# Patient Record
Sex: Female | Born: 1965 | Race: White | Hispanic: No | Marital: Married | Smoking: Former smoker
Health system: Southern US, Community
[De-identification: ages and names within clinical notes are randomized; demographics above are authoritative.]

## PROBLEM LIST (undated history)

## (undated) ENCOUNTER — Emergency Department: Discharge status: Absent without leave | Payer: Self-pay

## (undated) ENCOUNTER — Emergency Department (HOSPITAL_BASED_OUTPATIENT_CLINIC_OR_DEPARTMENT_OTHER): Admission: EM | Payer: Medicare HMO

## (undated) ENCOUNTER — Emergency Department (HOSPITAL_BASED_OUTPATIENT_CLINIC_OR_DEPARTMENT_OTHER): Payer: Medicare HMO | Source: Home / Self Care

## (undated) DIAGNOSIS — F40240 Claustrophobia: Secondary | ICD-10-CM

## (undated) DIAGNOSIS — R569 Unspecified convulsions: Secondary | ICD-10-CM

## (undated) DIAGNOSIS — Z22322 Carrier or suspected carrier of Methicillin resistant Staphylococcus aureus: Secondary | ICD-10-CM

## (undated) DIAGNOSIS — F4310 Post-traumatic stress disorder, unspecified: Secondary | ICD-10-CM

## (undated) DIAGNOSIS — R252 Cramp and spasm: Secondary | ICD-10-CM

## (undated) DIAGNOSIS — M25519 Pain in unspecified shoulder: Secondary | ICD-10-CM

## (undated) DIAGNOSIS — J383 Other diseases of vocal cords: Secondary | ICD-10-CM

## (undated) DIAGNOSIS — C4499 Other specified malignant neoplasm of skin, unspecified: Secondary | ICD-10-CM

## (undated) DIAGNOSIS — I10 Essential (primary) hypertension: Secondary | ICD-10-CM

## (undated) DIAGNOSIS — G8929 Other chronic pain: Secondary | ICD-10-CM

## (undated) DIAGNOSIS — E559 Vitamin D deficiency, unspecified: Secondary | ICD-10-CM

## (undated) DIAGNOSIS — E119 Type 2 diabetes mellitus without complications: Secondary | ICD-10-CM

## (undated) DIAGNOSIS — C55 Malignant neoplasm of uterus, part unspecified: Secondary | ICD-10-CM

## (undated) DIAGNOSIS — T4145XA Adverse effect of unspecified anesthetic, initial encounter: Secondary | ICD-10-CM

## (undated) DIAGNOSIS — G35D Multiple sclerosis, unspecified: Secondary | ICD-10-CM

## (undated) DIAGNOSIS — T7840XA Allergy, unspecified, initial encounter: Secondary | ICD-10-CM

## (undated) DIAGNOSIS — C519 Malignant neoplasm of vulva, unspecified: Secondary | ICD-10-CM

## (undated) DIAGNOSIS — K219 Gastro-esophageal reflux disease without esophagitis: Secondary | ICD-10-CM

## (undated) DIAGNOSIS — E785 Hyperlipidemia, unspecified: Secondary | ICD-10-CM

## (undated) DIAGNOSIS — R05 Cough: Secondary | ICD-10-CM

## (undated) DIAGNOSIS — G35 Multiple sclerosis: Secondary | ICD-10-CM

## (undated) DIAGNOSIS — I493 Ventricular premature depolarization: Secondary | ICD-10-CM

## (undated) DIAGNOSIS — D649 Anemia, unspecified: Secondary | ICD-10-CM

## (undated) DIAGNOSIS — R519 Headache, unspecified: Secondary | ICD-10-CM

## (undated) DIAGNOSIS — I471 Supraventricular tachycardia: Secondary | ICD-10-CM

## (undated) DIAGNOSIS — R059 Cough, unspecified: Secondary | ICD-10-CM

## (undated) DIAGNOSIS — T8859XA Other complications of anesthesia, initial encounter: Secondary | ICD-10-CM

## (undated) DIAGNOSIS — F609 Personality disorder, unspecified: Secondary | ICD-10-CM

## (undated) DIAGNOSIS — M542 Cervicalgia: Secondary | ICD-10-CM

## (undated) DIAGNOSIS — M545 Low back pain: Secondary | ICD-10-CM

## (undated) DIAGNOSIS — R51 Headache: Secondary | ICD-10-CM

## (undated) DIAGNOSIS — G4730 Sleep apnea, unspecified: Secondary | ICD-10-CM

## (undated) HISTORY — DX: Personality disorder, unspecified: F60.9

## (undated) HISTORY — DX: Cough: R05

## (undated) HISTORY — DX: Cervicalgia: M54.2

## (undated) HISTORY — PX: OTHER SURGICAL HISTORY: SHX169

## (undated) HISTORY — DX: Other chronic pain: G89.29

## (undated) HISTORY — PX: APPENDECTOMY: SHX54

## (undated) HISTORY — DX: Unspecified convulsions: R56.9

## (undated) HISTORY — DX: Anemia, unspecified: D64.9

## (undated) HISTORY — DX: Pain in unspecified shoulder: M25.519

## (undated) HISTORY — DX: Headache, unspecified: R51.9

## (undated) HISTORY — PX: CARDIAC CATHETERIZATION: SHX172

## (undated) HISTORY — DX: Allergy, unspecified, initial encounter: T78.40XA

## (undated) HISTORY — PX: LOOP RECORDER REMOVAL: EP1215

## (undated) HISTORY — DX: Cough, unspecified: R05.9

## (undated) HISTORY — DX: Essential (primary) hypertension: I10

## (undated) HISTORY — DX: Other specified malignant neoplasm of skin, unspecified: C44.99

## (undated) HISTORY — PX: BREAST LUMPECTOMY: SHX2

## (undated) HISTORY — DX: Headache: R51

## (undated) HISTORY — DX: Cramp and spasm: R25.2

## (undated) HISTORY — DX: Gastro-esophageal reflux disease without esophagitis: K21.9

## (undated) HISTORY — DX: Malignant neoplasm of vulva, unspecified: C51.9

## (undated) HISTORY — DX: Hyperlipidemia, unspecified: E78.5

## (undated) HISTORY — DX: Supraventricular tachycardia: I47.1

## (undated) HISTORY — PX: ESOPHAGEAL DILATION: SHX303

## (undated) HISTORY — DX: Post-traumatic stress disorder, unspecified: F43.10

## (undated) HISTORY — PX: CHOLECYSTECTOMY: SHX55

## (undated) HISTORY — PX: TUBAL LIGATION: SHX77

## (undated) HISTORY — DX: Sleep apnea, unspecified: G47.30

## (undated) HISTORY — DX: Low back pain: M54.5

---

## 2001-10-13 ENCOUNTER — Emergency Department (HOSPITAL_COMMUNITY): Admission: EM | Admit: 2001-10-13 | Discharge: 2001-10-13 | Payer: Self-pay

## 2002-01-23 ENCOUNTER — Encounter: Payer: Self-pay | Admitting: Critical Care Medicine

## 2002-01-23 ENCOUNTER — Encounter: Payer: Self-pay | Admitting: Pulmonary Disease

## 2003-01-11 ENCOUNTER — Emergency Department (HOSPITAL_COMMUNITY): Admission: EM | Admit: 2003-01-11 | Discharge: 2003-01-11 | Payer: Self-pay | Admitting: Emergency Medicine

## 2003-01-11 ENCOUNTER — Encounter: Payer: Self-pay | Admitting: Emergency Medicine

## 2003-04-19 ENCOUNTER — Emergency Department (HOSPITAL_COMMUNITY): Admission: EM | Admit: 2003-04-19 | Discharge: 2003-04-20 | Payer: Self-pay | Admitting: Emergency Medicine

## 2003-06-10 ENCOUNTER — Encounter: Payer: Self-pay | Admitting: Critical Care Medicine

## 2004-01-13 ENCOUNTER — Encounter: Payer: Self-pay | Admitting: Critical Care Medicine

## 2004-02-12 DIAGNOSIS — M545 Low back pain, unspecified: Secondary | ICD-10-CM

## 2004-02-12 HISTORY — DX: Low back pain: M54.5

## 2004-02-12 HISTORY — DX: Low back pain, unspecified: M54.50

## 2005-11-27 ENCOUNTER — Encounter: Payer: Self-pay | Admitting: Critical Care Medicine

## 2005-12-12 DIAGNOSIS — M542 Cervicalgia: Secondary | ICD-10-CM

## 2005-12-12 HISTORY — DX: Cervicalgia: M54.2

## 2005-12-28 ENCOUNTER — Encounter: Payer: Self-pay | Admitting: Critical Care Medicine

## 2006-03-01 ENCOUNTER — Encounter: Payer: Self-pay | Admitting: Critical Care Medicine

## 2007-01-16 ENCOUNTER — Encounter: Payer: Self-pay | Admitting: Critical Care Medicine

## 2007-04-04 ENCOUNTER — Encounter: Payer: Self-pay | Admitting: Critical Care Medicine

## 2007-04-05 ENCOUNTER — Encounter: Payer: Self-pay | Admitting: Critical Care Medicine

## 2007-04-18 ENCOUNTER — Encounter: Payer: Self-pay | Admitting: Critical Care Medicine

## 2007-04-22 ENCOUNTER — Encounter: Payer: Self-pay | Admitting: Critical Care Medicine

## 2007-05-23 ENCOUNTER — Encounter: Payer: Self-pay | Admitting: Critical Care Medicine

## 2007-05-23 ENCOUNTER — Encounter: Payer: Self-pay | Admitting: Family Medicine

## 2007-06-21 ENCOUNTER — Emergency Department (HOSPITAL_COMMUNITY): Admission: EM | Admit: 2007-06-21 | Discharge: 2007-06-21 | Payer: Self-pay | Admitting: Emergency Medicine

## 2007-07-02 ENCOUNTER — Encounter: Payer: Self-pay | Admitting: Critical Care Medicine

## 2007-08-06 ENCOUNTER — Encounter: Payer: Self-pay | Admitting: Family Medicine

## 2007-08-15 DIAGNOSIS — G4730 Sleep apnea, unspecified: Secondary | ICD-10-CM

## 2007-08-15 HISTORY — DX: Sleep apnea, unspecified: G47.30

## 2007-08-26 ENCOUNTER — Encounter (INDEPENDENT_AMBULATORY_CARE_PROVIDER_SITE_OTHER): Payer: Self-pay | Admitting: *Deleted

## 2007-08-30 ENCOUNTER — Emergency Department (HOSPITAL_COMMUNITY): Admission: EM | Admit: 2007-08-30 | Discharge: 2007-08-31 | Payer: Self-pay | Admitting: Emergency Medicine

## 2007-09-01 ENCOUNTER — Encounter (INDEPENDENT_AMBULATORY_CARE_PROVIDER_SITE_OTHER): Payer: Self-pay | Admitting: *Deleted

## 2007-09-16 ENCOUNTER — Encounter (INDEPENDENT_AMBULATORY_CARE_PROVIDER_SITE_OTHER): Payer: Self-pay | Admitting: *Deleted

## 2007-09-17 ENCOUNTER — Encounter: Payer: Self-pay | Admitting: Critical Care Medicine

## 2007-10-07 ENCOUNTER — Encounter: Payer: Self-pay | Admitting: Critical Care Medicine

## 2007-10-10 ENCOUNTER — Encounter: Payer: Self-pay | Admitting: Critical Care Medicine

## 2007-10-24 ENCOUNTER — Encounter: Payer: Self-pay | Admitting: Family Medicine

## 2007-11-01 ENCOUNTER — Encounter: Payer: Self-pay | Admitting: Critical Care Medicine

## 2007-11-14 ENCOUNTER — Encounter: Payer: Self-pay | Admitting: Critical Care Medicine

## 2007-11-23 ENCOUNTER — Encounter: Payer: Self-pay | Admitting: Critical Care Medicine

## 2007-12-04 ENCOUNTER — Emergency Department (HOSPITAL_COMMUNITY): Admission: EM | Admit: 2007-12-04 | Discharge: 2007-12-04 | Payer: Self-pay | Admitting: Emergency Medicine

## 2007-12-05 ENCOUNTER — Encounter: Payer: Self-pay | Admitting: Critical Care Medicine

## 2007-12-09 ENCOUNTER — Ambulatory Visit: Payer: Self-pay | Admitting: Family Medicine

## 2007-12-09 DIAGNOSIS — I10 Essential (primary) hypertension: Secondary | ICD-10-CM | POA: Insufficient documentation

## 2007-12-09 DIAGNOSIS — K760 Fatty (change of) liver, not elsewhere classified: Secondary | ICD-10-CM | POA: Insufficient documentation

## 2007-12-09 DIAGNOSIS — K7689 Other specified diseases of liver: Secondary | ICD-10-CM | POA: Insufficient documentation

## 2007-12-12 LAB — CONVERTED CEMR LAB: Pap Smear: NORMAL

## 2007-12-21 ENCOUNTER — Encounter: Payer: Self-pay | Admitting: Family Medicine

## 2007-12-27 ENCOUNTER — Encounter: Payer: Self-pay | Admitting: Physician Assistant

## 2007-12-27 ENCOUNTER — Ambulatory Visit: Payer: Self-pay | Admitting: Physician Assistant

## 2007-12-27 ENCOUNTER — Other Ambulatory Visit: Admission: RE | Admit: 2007-12-27 | Discharge: 2007-12-27 | Payer: Self-pay | Admitting: Gynecology

## 2008-01-10 ENCOUNTER — Telehealth: Payer: Self-pay | Admitting: Family Medicine

## 2008-01-13 DIAGNOSIS — K219 Gastro-esophageal reflux disease without esophagitis: Secondary | ICD-10-CM

## 2008-01-13 HISTORY — DX: Gastro-esophageal reflux disease without esophagitis: K21.9

## 2008-01-16 ENCOUNTER — Emergency Department (HOSPITAL_BASED_OUTPATIENT_CLINIC_OR_DEPARTMENT_OTHER): Admission: EM | Admit: 2008-01-16 | Discharge: 2008-01-16 | Payer: Self-pay | Admitting: Emergency Medicine

## 2008-01-16 ENCOUNTER — Ambulatory Visit: Payer: Self-pay | Admitting: Family Medicine

## 2008-01-18 ENCOUNTER — Encounter: Payer: Self-pay | Admitting: Critical Care Medicine

## 2008-01-19 ENCOUNTER — Emergency Department (HOSPITAL_BASED_OUTPATIENT_CLINIC_OR_DEPARTMENT_OTHER): Admission: EM | Admit: 2008-01-19 | Discharge: 2008-01-19 | Payer: Self-pay | Admitting: Emergency Medicine

## 2008-01-20 ENCOUNTER — Encounter: Payer: Self-pay | Admitting: Family Medicine

## 2008-01-21 ENCOUNTER — Encounter: Payer: Self-pay | Admitting: Family Medicine

## 2008-01-21 DIAGNOSIS — R519 Headache, unspecified: Secondary | ICD-10-CM | POA: Insufficient documentation

## 2008-01-21 DIAGNOSIS — R51 Headache: Secondary | ICD-10-CM

## 2008-01-21 DIAGNOSIS — H532 Diplopia: Secondary | ICD-10-CM | POA: Insufficient documentation

## 2008-01-27 ENCOUNTER — Telehealth (INDEPENDENT_AMBULATORY_CARE_PROVIDER_SITE_OTHER): Payer: Self-pay | Admitting: *Deleted

## 2008-01-28 ENCOUNTER — Encounter: Payer: Self-pay | Admitting: Family Medicine

## 2008-01-29 ENCOUNTER — Encounter: Payer: Self-pay | Admitting: Family Medicine

## 2008-01-29 ENCOUNTER — Ambulatory Visit: Payer: Self-pay | Admitting: Internal Medicine

## 2008-01-30 ENCOUNTER — Encounter: Payer: Self-pay | Admitting: Family Medicine

## 2008-02-03 ENCOUNTER — Encounter: Payer: Self-pay | Admitting: Critical Care Medicine

## 2008-02-03 ENCOUNTER — Emergency Department (HOSPITAL_BASED_OUTPATIENT_CLINIC_OR_DEPARTMENT_OTHER): Admission: EM | Admit: 2008-02-03 | Discharge: 2008-02-03 | Payer: Self-pay | Admitting: Emergency Medicine

## 2008-02-03 ENCOUNTER — Encounter: Payer: Self-pay | Admitting: Family Medicine

## 2008-02-04 ENCOUNTER — Encounter: Payer: Self-pay | Admitting: Critical Care Medicine

## 2008-02-04 ENCOUNTER — Encounter: Payer: Self-pay | Admitting: Pulmonary Disease

## 2008-02-04 ENCOUNTER — Encounter: Payer: Self-pay | Admitting: Family Medicine

## 2008-02-05 ENCOUNTER — Encounter: Payer: Self-pay | Admitting: Pulmonary Disease

## 2008-02-10 ENCOUNTER — Encounter: Payer: Self-pay | Admitting: Family Medicine

## 2008-02-12 ENCOUNTER — Ambulatory Visit: Payer: Self-pay | Admitting: Family Medicine

## 2008-02-14 ENCOUNTER — Ambulatory Visit: Payer: Self-pay | Admitting: Internal Medicine

## 2008-02-14 ENCOUNTER — Emergency Department (HOSPITAL_COMMUNITY): Admission: EM | Admit: 2008-02-14 | Discharge: 2008-02-14 | Payer: Self-pay | Admitting: Emergency Medicine

## 2008-02-17 ENCOUNTER — Emergency Department (HOSPITAL_BASED_OUTPATIENT_CLINIC_OR_DEPARTMENT_OTHER): Admission: EM | Admit: 2008-02-17 | Discharge: 2008-02-18 | Payer: Self-pay | Admitting: Emergency Medicine

## 2008-02-17 ENCOUNTER — Ambulatory Visit: Payer: Self-pay | Admitting: Internal Medicine

## 2008-02-19 ENCOUNTER — Encounter: Payer: Self-pay | Admitting: Critical Care Medicine

## 2008-02-21 ENCOUNTER — Ambulatory Visit: Payer: Self-pay | Admitting: Family Medicine

## 2008-02-21 DIAGNOSIS — R1310 Dysphagia, unspecified: Secondary | ICD-10-CM | POA: Insufficient documentation

## 2008-02-26 ENCOUNTER — Encounter: Payer: Self-pay | Admitting: Family Medicine

## 2008-02-26 ENCOUNTER — Telehealth: Payer: Self-pay | Admitting: Family Medicine

## 2008-03-01 LAB — CONVERTED CEMR LAB
ALT: 13 units/L (ref 0–35)
AST: 13 units/L (ref 0–37)
Albumin: 4.5 g/dL (ref 3.5–5.2)
Alkaline Phosphatase: 55 units/L (ref 39–117)
BUN: 13 mg/dL (ref 6–23)
CO2: 20 meq/L (ref 19–32)
Calcium: 9.1 mg/dL (ref 8.4–10.5)
Chloride: 106 meq/L (ref 96–112)
Cholesterol, target level: 200 mg/dL
Cholesterol: 224 mg/dL — ABNORMAL HIGH (ref 0–200)
Creatinine, Ser: 0.59 mg/dL (ref 0.40–1.20)
Glucose, Bld: 75 mg/dL (ref 70–99)
HDL goal, serum: 40 mg/dL
HDL: 40 mg/dL (ref >39)
LDL Cholesterol: 156 mg/dL — ABNORMAL HIGH (ref 0–99)
LDL Goal: 160 mg/dL
Potassium: 4.3 meq/L (ref 3.5–5.3)
Sodium: 141 meq/L (ref 135–145)
Total Bilirubin: 0.5 mg/dL (ref 0.3–1.2)
Total CHOL/HDL Ratio: 5.6
Total Protein: 7.6 g/dL (ref 6.0–8.3)
Triglycerides: 139 mg/dL (ref <150)
VLDL: 28 mg/dL (ref 0–40)

## 2008-03-03 ENCOUNTER — Encounter: Payer: Self-pay | Admitting: Family Medicine

## 2008-03-03 ENCOUNTER — Emergency Department (HOSPITAL_BASED_OUTPATIENT_CLINIC_OR_DEPARTMENT_OTHER): Admission: EM | Admit: 2008-03-03 | Discharge: 2008-03-03 | Payer: Self-pay | Admitting: Emergency Medicine

## 2008-03-14 ENCOUNTER — Emergency Department (HOSPITAL_BASED_OUTPATIENT_CLINIC_OR_DEPARTMENT_OTHER): Admission: EM | Admit: 2008-03-14 | Discharge: 2008-03-14 | Payer: Self-pay | Admitting: Emergency Medicine

## 2008-03-14 DIAGNOSIS — I471 Supraventricular tachycardia: Secondary | ICD-10-CM

## 2008-03-14 DIAGNOSIS — I4719 Other supraventricular tachycardia: Secondary | ICD-10-CM

## 2008-03-14 HISTORY — DX: Other supraventricular tachycardia: I47.19

## 2008-03-14 HISTORY — DX: Supraventricular tachycardia: I47.1

## 2008-03-18 ENCOUNTER — Encounter: Payer: Self-pay | Admitting: Critical Care Medicine

## 2008-03-24 ENCOUNTER — Encounter: Payer: Self-pay | Admitting: Family Medicine

## 2008-03-26 ENCOUNTER — Encounter: Payer: Self-pay | Admitting: Critical Care Medicine

## 2008-03-28 ENCOUNTER — Emergency Department (HOSPITAL_COMMUNITY): Admission: EM | Admit: 2008-03-28 | Discharge: 2008-03-28 | Payer: Self-pay | Admitting: Emergency Medicine

## 2008-03-31 ENCOUNTER — Emergency Department (HOSPITAL_BASED_OUTPATIENT_CLINIC_OR_DEPARTMENT_OTHER): Admission: EM | Admit: 2008-03-31 | Discharge: 2008-03-31 | Payer: Self-pay | Admitting: Emergency Medicine

## 2008-03-31 ENCOUNTER — Encounter: Payer: Self-pay | Admitting: Family Medicine

## 2008-04-01 ENCOUNTER — Ambulatory Visit: Payer: Self-pay | Admitting: Obstetrics & Gynecology

## 2008-04-11 ENCOUNTER — Emergency Department (HOSPITAL_BASED_OUTPATIENT_CLINIC_OR_DEPARTMENT_OTHER): Admission: EM | Admit: 2008-04-11 | Discharge: 2008-04-11 | Payer: Self-pay | Admitting: Emergency Medicine

## 2008-04-14 ENCOUNTER — Encounter: Payer: Self-pay | Admitting: Critical Care Medicine

## 2008-04-15 ENCOUNTER — Encounter: Payer: Self-pay | Admitting: Critical Care Medicine

## 2008-04-17 ENCOUNTER — Emergency Department (HOSPITAL_BASED_OUTPATIENT_CLINIC_OR_DEPARTMENT_OTHER): Admission: EM | Admit: 2008-04-17 | Discharge: 2008-04-17 | Payer: Self-pay | Admitting: Emergency Medicine

## 2008-04-19 ENCOUNTER — Inpatient Hospital Stay (HOSPITAL_COMMUNITY): Admission: EM | Admit: 2008-04-19 | Discharge: 2008-04-20 | Payer: Self-pay | Admitting: Emergency Medicine

## 2008-04-19 ENCOUNTER — Ambulatory Visit: Payer: Self-pay | Admitting: Cardiology

## 2008-04-22 ENCOUNTER — Ambulatory Visit: Payer: Self-pay | Admitting: Family Medicine

## 2008-04-23 ENCOUNTER — Encounter (INDEPENDENT_AMBULATORY_CARE_PROVIDER_SITE_OTHER): Payer: Self-pay | Admitting: *Deleted

## 2008-04-25 ENCOUNTER — Ambulatory Visit: Payer: Self-pay | Admitting: *Deleted

## 2008-04-26 ENCOUNTER — Inpatient Hospital Stay (HOSPITAL_COMMUNITY): Admission: EM | Admit: 2008-04-26 | Discharge: 2008-04-26 | Payer: Self-pay | Admitting: Emergency Medicine

## 2008-04-29 ENCOUNTER — Ambulatory Visit: Payer: Self-pay | Admitting: Critical Care Medicine

## 2008-05-01 ENCOUNTER — Telehealth (INDEPENDENT_AMBULATORY_CARE_PROVIDER_SITE_OTHER): Payer: Self-pay | Admitting: *Deleted

## 2008-05-03 DIAGNOSIS — K219 Gastro-esophageal reflux disease without esophagitis: Secondary | ICD-10-CM | POA: Insufficient documentation

## 2008-05-04 ENCOUNTER — Telehealth (INDEPENDENT_AMBULATORY_CARE_PROVIDER_SITE_OTHER): Payer: Self-pay | Admitting: *Deleted

## 2008-05-04 ENCOUNTER — Ambulatory Visit: Payer: Self-pay | Admitting: Pulmonary Disease

## 2008-05-07 DIAGNOSIS — Q078 Other specified congenital malformations of nervous system: Secondary | ICD-10-CM | POA: Insufficient documentation

## 2008-05-07 DIAGNOSIS — J3089 Other allergic rhinitis: Secondary | ICD-10-CM | POA: Insufficient documentation

## 2008-05-07 DIAGNOSIS — I472 Ventricular tachycardia, unspecified: Secondary | ICD-10-CM | POA: Insufficient documentation

## 2008-05-07 DIAGNOSIS — J383 Other diseases of vocal cords: Secondary | ICD-10-CM | POA: Insufficient documentation

## 2008-05-13 ENCOUNTER — Telehealth (INDEPENDENT_AMBULATORY_CARE_PROVIDER_SITE_OTHER): Payer: Self-pay | Admitting: *Deleted

## 2008-05-14 ENCOUNTER — Ambulatory Visit: Payer: Self-pay | Admitting: Critical Care Medicine

## 2008-05-14 ENCOUNTER — Telehealth: Payer: Self-pay | Admitting: Pulmonary Disease

## 2008-05-15 ENCOUNTER — Telehealth (INDEPENDENT_AMBULATORY_CARE_PROVIDER_SITE_OTHER): Payer: Self-pay | Admitting: *Deleted

## 2008-05-18 ENCOUNTER — Telehealth (INDEPENDENT_AMBULATORY_CARE_PROVIDER_SITE_OTHER): Payer: Self-pay | Admitting: *Deleted

## 2008-05-19 ENCOUNTER — Ambulatory Visit: Payer: Self-pay | Admitting: Family Medicine

## 2008-05-19 ENCOUNTER — Encounter: Payer: Self-pay | Admitting: Critical Care Medicine

## 2008-05-19 ENCOUNTER — Observation Stay (HOSPITAL_COMMUNITY): Admission: EM | Admit: 2008-05-19 | Discharge: 2008-05-20 | Payer: Self-pay | Admitting: Emergency Medicine

## 2008-05-19 ENCOUNTER — Telehealth (INDEPENDENT_AMBULATORY_CARE_PROVIDER_SITE_OTHER): Payer: Self-pay | Admitting: *Deleted

## 2008-05-19 ENCOUNTER — Encounter: Payer: Self-pay | Admitting: Family Medicine

## 2008-05-21 ENCOUNTER — Telehealth: Payer: Self-pay | Admitting: Critical Care Medicine

## 2008-05-26 ENCOUNTER — Ambulatory Visit: Payer: Self-pay | Admitting: *Deleted

## 2008-05-26 ENCOUNTER — Encounter: Payer: Self-pay | Admitting: Family Medicine

## 2008-05-26 ENCOUNTER — Encounter: Payer: Self-pay | Admitting: Critical Care Medicine

## 2008-05-27 ENCOUNTER — Encounter: Admission: RE | Admit: 2008-05-27 | Discharge: 2008-08-13 | Payer: Self-pay | Admitting: Pulmonary Disease

## 2008-05-27 ENCOUNTER — Encounter: Payer: Self-pay | Admitting: Pulmonary Disease

## 2008-05-27 ENCOUNTER — Ambulatory Visit: Payer: Self-pay | Admitting: Internal Medicine

## 2008-05-28 ENCOUNTER — Emergency Department (HOSPITAL_BASED_OUTPATIENT_CLINIC_OR_DEPARTMENT_OTHER): Admission: EM | Admit: 2008-05-28 | Discharge: 2008-05-28 | Payer: Self-pay | Admitting: Emergency Medicine

## 2008-05-28 ENCOUNTER — Ambulatory Visit: Payer: Self-pay | Admitting: Critical Care Medicine

## 2008-05-29 ENCOUNTER — Encounter: Payer: Self-pay | Admitting: Critical Care Medicine

## 2008-06-01 ENCOUNTER — Ambulatory Visit (HOSPITAL_COMMUNITY): Admission: RE | Admit: 2008-06-01 | Discharge: 2008-06-01 | Payer: Self-pay | Admitting: Gastroenterology

## 2008-06-01 ENCOUNTER — Encounter: Payer: Self-pay | Admitting: Critical Care Medicine

## 2008-06-03 ENCOUNTER — Encounter (INDEPENDENT_AMBULATORY_CARE_PROVIDER_SITE_OTHER): Payer: Self-pay | Admitting: *Deleted

## 2008-06-05 ENCOUNTER — Encounter: Payer: Self-pay | Admitting: Family Medicine

## 2008-06-08 DIAGNOSIS — F411 Generalized anxiety disorder: Secondary | ICD-10-CM | POA: Insufficient documentation

## 2008-06-08 DIAGNOSIS — J3501 Chronic tonsillitis: Secondary | ICD-10-CM | POA: Insufficient documentation

## 2008-06-08 DIAGNOSIS — F4312 Post-traumatic stress disorder, chronic: Secondary | ICD-10-CM | POA: Insufficient documentation

## 2008-06-08 DIAGNOSIS — R279 Unspecified lack of coordination: Secondary | ICD-10-CM | POA: Insufficient documentation

## 2008-06-08 DIAGNOSIS — R109 Unspecified abdominal pain: Secondary | ICD-10-CM | POA: Insufficient documentation

## 2008-06-08 DIAGNOSIS — J984 Other disorders of lung: Secondary | ICD-10-CM | POA: Insufficient documentation

## 2008-06-08 DIAGNOSIS — Z8669 Personal history of other diseases of the nervous system and sense organs: Secondary | ICD-10-CM | POA: Insufficient documentation

## 2008-06-08 DIAGNOSIS — R5381 Other malaise: Secondary | ICD-10-CM | POA: Insufficient documentation

## 2008-06-08 DIAGNOSIS — K6289 Other specified diseases of anus and rectum: Secondary | ICD-10-CM | POA: Insufficient documentation

## 2008-06-08 DIAGNOSIS — M797 Fibromyalgia: Secondary | ICD-10-CM | POA: Insufficient documentation

## 2008-06-08 DIAGNOSIS — G43909 Migraine, unspecified, not intractable, without status migrainosus: Secondary | ICD-10-CM | POA: Insufficient documentation

## 2008-06-08 DIAGNOSIS — F341 Dysthymic disorder: Secondary | ICD-10-CM | POA: Insufficient documentation

## 2008-06-08 DIAGNOSIS — K224 Dyskinesia of esophagus: Secondary | ICD-10-CM | POA: Insufficient documentation

## 2008-06-08 DIAGNOSIS — D649 Anemia, unspecified: Secondary | ICD-10-CM | POA: Insufficient documentation

## 2008-06-08 DIAGNOSIS — H5005 Alternating esotropia: Secondary | ICD-10-CM | POA: Insufficient documentation

## 2008-06-08 DIAGNOSIS — F4310 Post-traumatic stress disorder, unspecified: Secondary | ICD-10-CM | POA: Insufficient documentation

## 2008-06-08 DIAGNOSIS — M5136 Other intervertebral disc degeneration, lumbar region: Secondary | ICD-10-CM | POA: Insufficient documentation

## 2008-06-08 DIAGNOSIS — R5383 Other fatigue: Secondary | ICD-10-CM

## 2008-06-08 DIAGNOSIS — F4001 Agoraphobia with panic disorder: Secondary | ICD-10-CM | POA: Insufficient documentation

## 2008-06-09 ENCOUNTER — Emergency Department (HOSPITAL_COMMUNITY): Admission: EM | Admit: 2008-06-09 | Discharge: 2008-06-09 | Payer: Self-pay | Admitting: Emergency Medicine

## 2008-06-11 DIAGNOSIS — F603 Borderline personality disorder: Secondary | ICD-10-CM | POA: Insufficient documentation

## 2008-06-12 ENCOUNTER — Emergency Department (HOSPITAL_BASED_OUTPATIENT_CLINIC_OR_DEPARTMENT_OTHER): Admission: EM | Admit: 2008-06-12 | Discharge: 2008-06-13 | Payer: Self-pay | Admitting: Emergency Medicine

## 2008-06-15 ENCOUNTER — Telehealth (INDEPENDENT_AMBULATORY_CARE_PROVIDER_SITE_OTHER): Payer: Self-pay | Admitting: *Deleted

## 2008-06-16 ENCOUNTER — Ambulatory Visit: Payer: Self-pay | Admitting: Occupational Medicine

## 2008-06-18 ENCOUNTER — Encounter: Payer: Self-pay | Admitting: Family Medicine

## 2008-06-18 ENCOUNTER — Ambulatory Visit: Payer: Self-pay | Admitting: Critical Care Medicine

## 2008-06-23 ENCOUNTER — Telehealth: Payer: Self-pay | Admitting: Critical Care Medicine

## 2008-07-01 ENCOUNTER — Emergency Department (HOSPITAL_COMMUNITY): Admission: EM | Admit: 2008-07-01 | Discharge: 2008-07-01 | Payer: Self-pay | Admitting: Emergency Medicine

## 2008-07-04 ENCOUNTER — Encounter: Payer: Self-pay | Admitting: Family Medicine

## 2008-07-06 ENCOUNTER — Telehealth (INDEPENDENT_AMBULATORY_CARE_PROVIDER_SITE_OTHER): Payer: Self-pay | Admitting: *Deleted

## 2008-07-08 ENCOUNTER — Ambulatory Visit: Payer: Self-pay | Admitting: Internal Medicine

## 2008-07-08 ENCOUNTER — Emergency Department (HOSPITAL_COMMUNITY): Admission: EM | Admit: 2008-07-08 | Discharge: 2008-07-08 | Payer: Self-pay | Admitting: Emergency Medicine

## 2008-07-13 ENCOUNTER — Ambulatory Visit: Payer: Self-pay | Admitting: Occupational Medicine

## 2008-07-14 ENCOUNTER — Encounter: Payer: Self-pay | Admitting: Family Medicine

## 2008-07-14 ENCOUNTER — Telehealth (INDEPENDENT_AMBULATORY_CARE_PROVIDER_SITE_OTHER): Payer: Self-pay | Admitting: *Deleted

## 2008-07-15 ENCOUNTER — Ambulatory Visit: Payer: Self-pay | Admitting: Family Medicine

## 2008-07-20 ENCOUNTER — Telehealth: Payer: Self-pay | Admitting: Family Medicine

## 2008-07-22 ENCOUNTER — Ambulatory Visit: Payer: Self-pay | Admitting: Family Medicine

## 2008-07-22 DIAGNOSIS — R42 Dizziness and giddiness: Secondary | ICD-10-CM | POA: Insufficient documentation

## 2008-07-23 ENCOUNTER — Ambulatory Visit: Payer: Self-pay | Admitting: Critical Care Medicine

## 2008-08-04 ENCOUNTER — Ambulatory Visit: Payer: Self-pay | Admitting: Family Medicine

## 2008-08-04 ENCOUNTER — Encounter: Admission: RE | Admit: 2008-08-04 | Discharge: 2008-08-04 | Payer: Self-pay | Admitting: Family Medicine

## 2008-08-11 ENCOUNTER — Ambulatory Visit: Payer: Self-pay | Admitting: Family Medicine

## 2008-08-11 LAB — CONVERTED CEMR LAB
Bilirubin Urine: NEGATIVE
Blood in Urine, dipstick: NEGATIVE
Glucose, Urine, Semiquant: NEGATIVE
Ketones, urine, test strip: NEGATIVE
Nitrite: NEGATIVE
Specific Gravity, Urine: >=1.03
Urobilinogen, UA: 0.2
WBC Urine, dipstick: NEGATIVE
pH: 5.5

## 2008-08-13 ENCOUNTER — Encounter: Payer: Self-pay | Admitting: Family Medicine

## 2008-08-14 ENCOUNTER — Emergency Department (HOSPITAL_BASED_OUTPATIENT_CLINIC_OR_DEPARTMENT_OTHER): Admission: EM | Admit: 2008-08-14 | Discharge: 2008-08-14 | Payer: Self-pay | Admitting: Emergency Medicine

## 2008-08-14 ENCOUNTER — Ambulatory Visit: Payer: Self-pay | Admitting: Interventional Radiology

## 2008-08-17 LAB — CONVERTED CEMR LAB
RBC / HPF: NONE SEEN (ref <3)
WBC, UA: NONE SEEN cells/hpf (ref <3)

## 2008-08-18 ENCOUNTER — Telehealth (INDEPENDENT_AMBULATORY_CARE_PROVIDER_SITE_OTHER): Payer: Self-pay | Admitting: *Deleted

## 2008-08-25 ENCOUNTER — Telehealth (INDEPENDENT_AMBULATORY_CARE_PROVIDER_SITE_OTHER): Payer: Self-pay | Admitting: *Deleted

## 2008-08-25 ENCOUNTER — Ambulatory Visit: Payer: Self-pay | Admitting: Family Medicine

## 2008-08-27 ENCOUNTER — Telehealth: Payer: Self-pay | Admitting: Family Medicine

## 2008-08-31 ENCOUNTER — Encounter: Payer: Self-pay | Admitting: Family Medicine

## 2008-09-01 ENCOUNTER — Telehealth: Payer: Self-pay | Admitting: Family Medicine

## 2008-09-03 ENCOUNTER — Telehealth: Payer: Self-pay | Admitting: Family Medicine

## 2008-09-04 ENCOUNTER — Emergency Department (HOSPITAL_COMMUNITY): Admission: EM | Admit: 2008-09-04 | Discharge: 2008-09-05 | Payer: Self-pay | Admitting: Emergency Medicine

## 2008-09-10 ENCOUNTER — Telehealth: Payer: Self-pay | Admitting: Family Medicine

## 2008-09-10 ENCOUNTER — Ambulatory Visit: Payer: Self-pay | Admitting: Critical Care Medicine

## 2008-09-10 DIAGNOSIS — J328 Other chronic sinusitis: Secondary | ICD-10-CM | POA: Insufficient documentation

## 2008-09-13 IMAGING — CR DG CHEST 2V
2 series · 2 of 2 positions shown · non-contrast
Comparison: None.

CLINICAL DATA: Shortness of breath.

CHEST - 2 VIEW  06/21/2007:

[w chest pa]
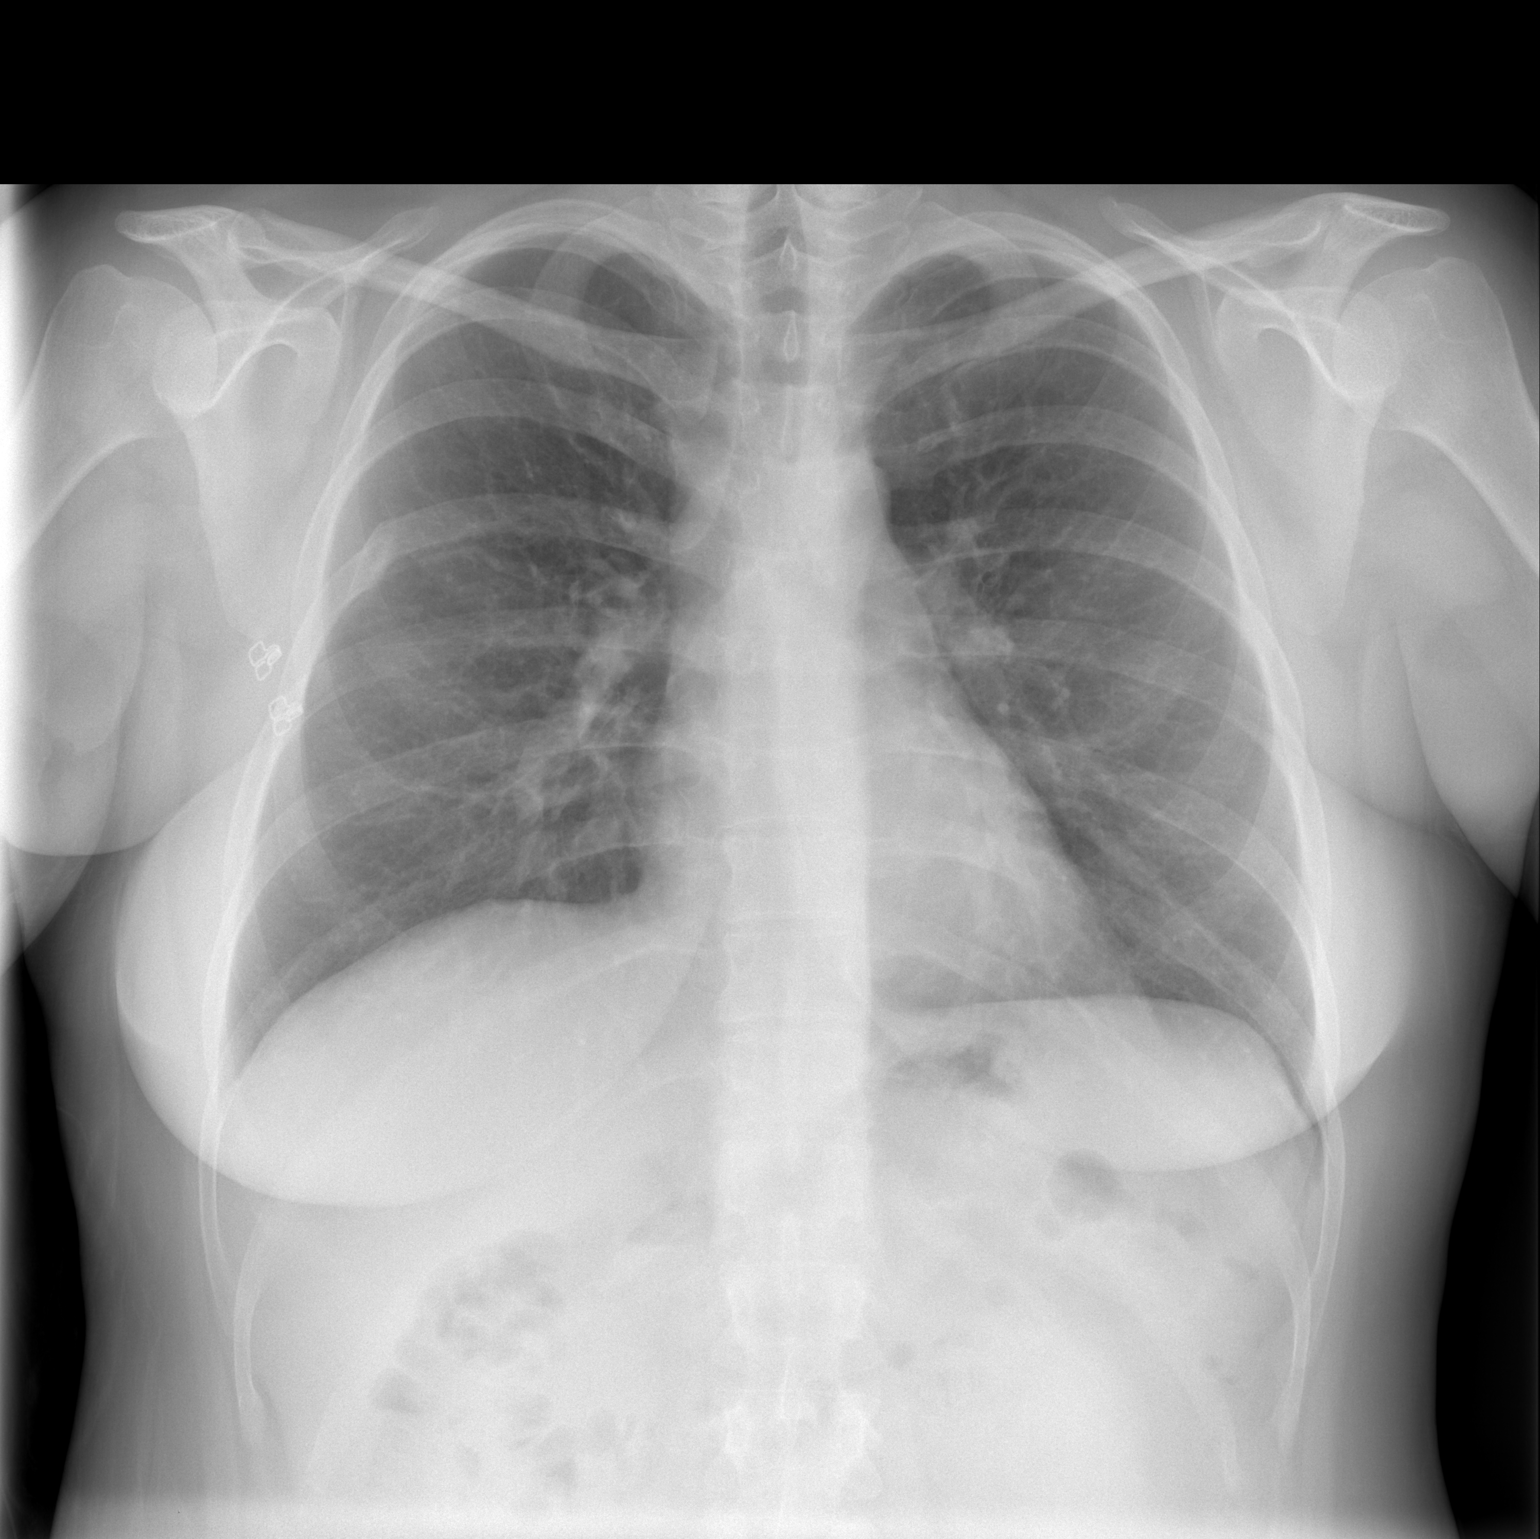

[w chest lat]
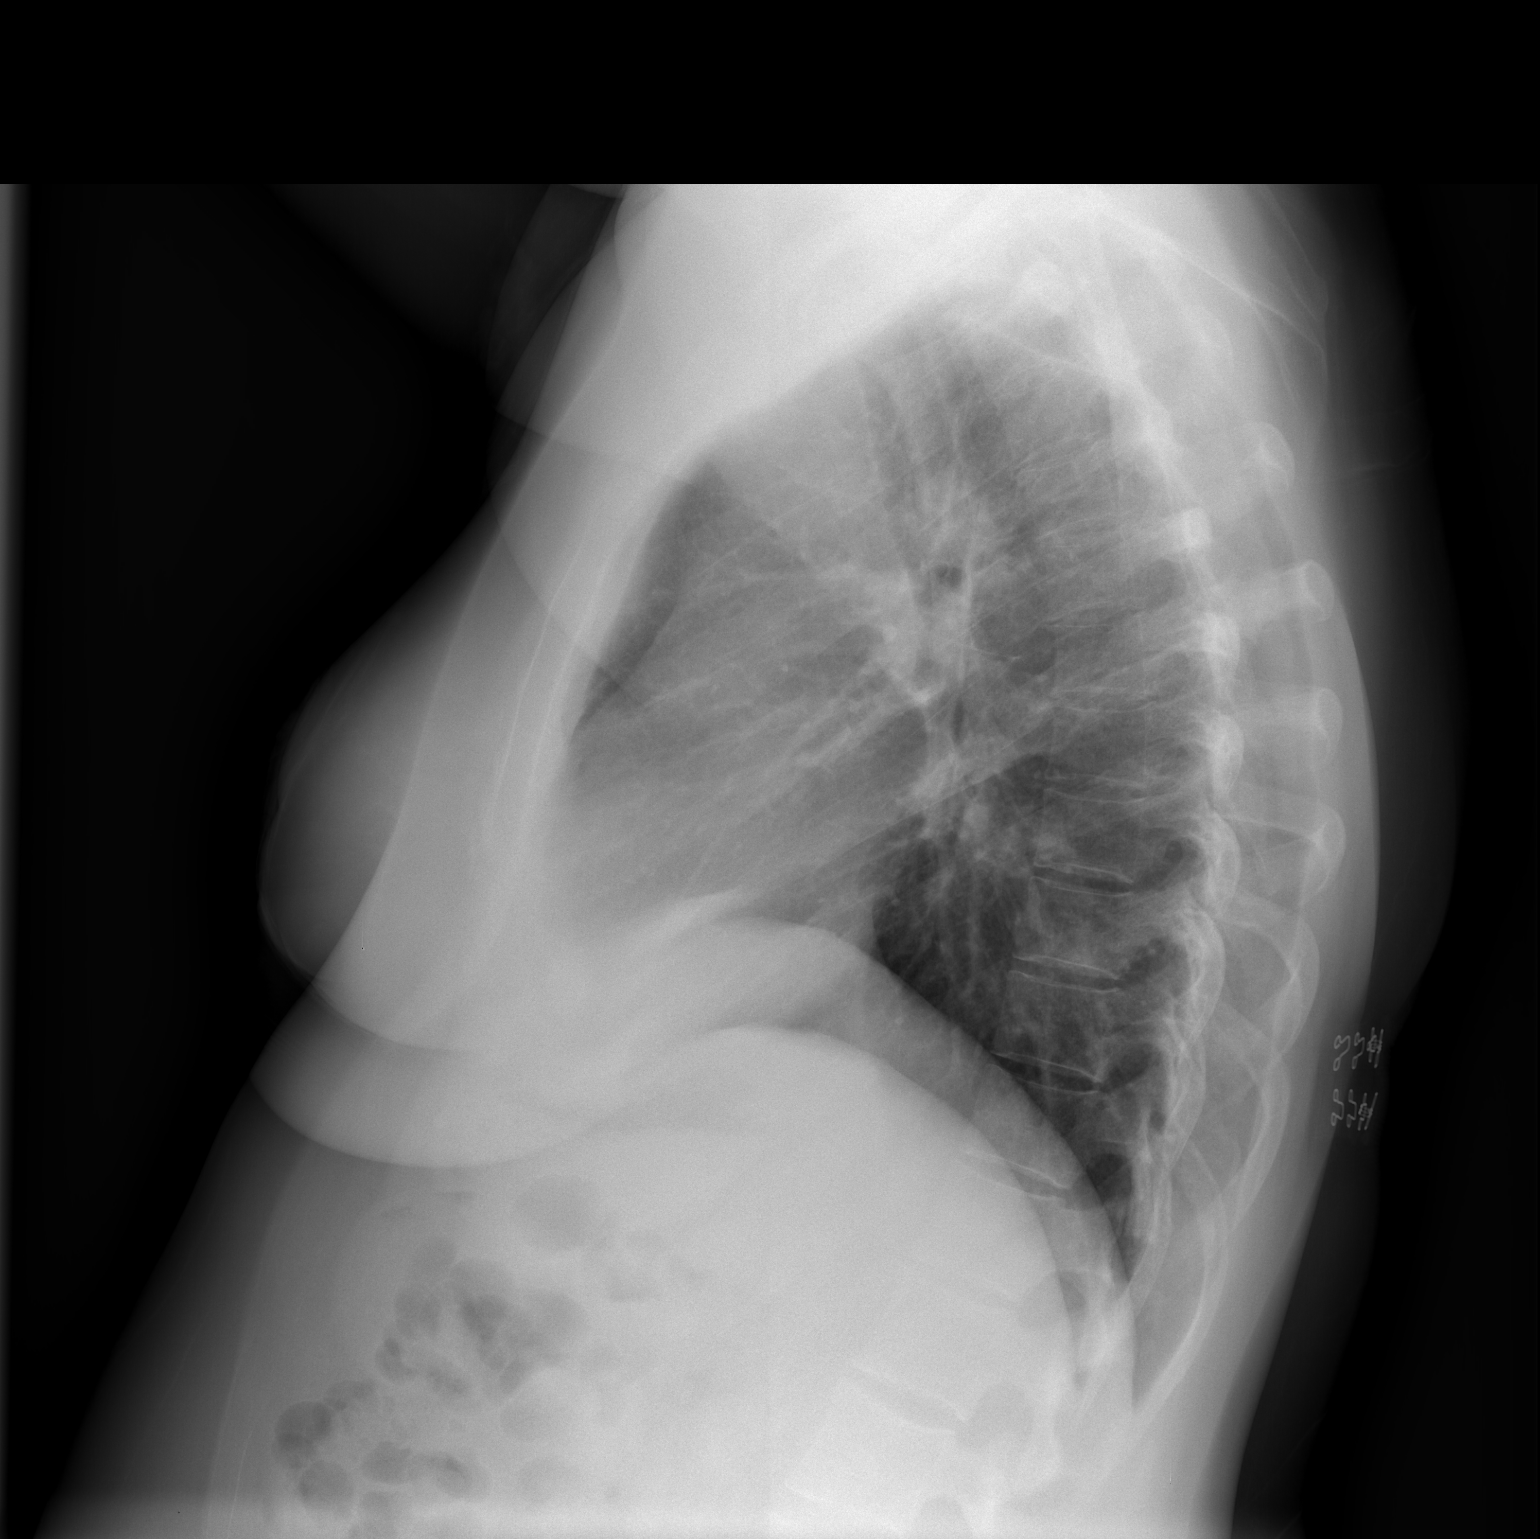

[2 of 2 positions shown; findings below may reference images not displayed]

FINDINGS: Cardiomediastinal silhouette unremarkable. Lungs clear. No pleural
effusions. Old healed right posterior 6th rib fracture. Visualized bony thorax
otherwise intact.
IMPRESSION: No acute cardiopulmonary disease.

## 2008-09-14 ENCOUNTER — Ambulatory Visit: Payer: Self-pay | Admitting: Family Medicine

## 2008-09-14 DIAGNOSIS — E785 Hyperlipidemia, unspecified: Secondary | ICD-10-CM | POA: Insufficient documentation

## 2008-09-14 DIAGNOSIS — M549 Dorsalgia, unspecified: Secondary | ICD-10-CM | POA: Insufficient documentation

## 2008-09-14 DIAGNOSIS — G4730 Sleep apnea, unspecified: Secondary | ICD-10-CM | POA: Insufficient documentation

## 2008-09-15 ENCOUNTER — Emergency Department (HOSPITAL_BASED_OUTPATIENT_CLINIC_OR_DEPARTMENT_OTHER): Admission: EM | Admit: 2008-09-15 | Discharge: 2008-09-15 | Payer: Self-pay | Admitting: Emergency Medicine

## 2008-09-15 ENCOUNTER — Ambulatory Visit: Payer: Self-pay | Admitting: Diagnostic Radiology

## 2008-09-16 ENCOUNTER — Encounter: Payer: Self-pay | Admitting: Family Medicine

## 2008-09-17 ENCOUNTER — Encounter: Payer: Self-pay | Admitting: Family Medicine

## 2008-09-18 ENCOUNTER — Emergency Department (HOSPITAL_COMMUNITY): Admission: EM | Admit: 2008-09-18 | Discharge: 2008-09-19 | Payer: Self-pay | Admitting: Emergency Medicine

## 2008-09-21 ENCOUNTER — Telehealth: Payer: Self-pay | Admitting: Family Medicine

## 2008-09-21 ENCOUNTER — Ambulatory Visit: Payer: Self-pay | Admitting: Family Medicine

## 2008-09-23 ENCOUNTER — Encounter: Payer: Self-pay | Admitting: Family Medicine

## 2008-09-25 ENCOUNTER — Ambulatory Visit: Payer: Self-pay | Admitting: Family Medicine

## 2008-09-28 ENCOUNTER — Ambulatory Visit: Payer: Self-pay | Admitting: Occupational Medicine

## 2008-09-29 ENCOUNTER — Encounter: Payer: Self-pay | Admitting: Family Medicine

## 2008-10-01 ENCOUNTER — Ambulatory Visit: Payer: Self-pay | Admitting: Critical Care Medicine

## 2008-10-01 ENCOUNTER — Telehealth (INDEPENDENT_AMBULATORY_CARE_PROVIDER_SITE_OTHER): Payer: Self-pay | Admitting: *Deleted

## 2008-10-02 ENCOUNTER — Ambulatory Visit: Payer: Self-pay | Admitting: Diagnostic Radiology

## 2008-10-02 ENCOUNTER — Telehealth (INDEPENDENT_AMBULATORY_CARE_PROVIDER_SITE_OTHER): Payer: Self-pay | Admitting: *Deleted

## 2008-10-02 ENCOUNTER — Emergency Department (HOSPITAL_BASED_OUTPATIENT_CLINIC_OR_DEPARTMENT_OTHER): Admission: EM | Admit: 2008-10-02 | Discharge: 2008-10-02 | Payer: Self-pay | Admitting: Emergency Medicine

## 2008-10-05 ENCOUNTER — Ambulatory Visit: Payer: Self-pay | Admitting: Family Medicine

## 2008-10-05 ENCOUNTER — Telehealth: Payer: Self-pay | Admitting: Family Medicine

## 2008-10-05 ENCOUNTER — Telehealth (INDEPENDENT_AMBULATORY_CARE_PROVIDER_SITE_OTHER): Payer: Self-pay | Admitting: *Deleted

## 2008-10-07 ENCOUNTER — Encounter: Payer: Self-pay | Admitting: Family Medicine

## 2008-10-07 ENCOUNTER — Telehealth: Payer: Self-pay | Admitting: Family Medicine

## 2008-10-08 ENCOUNTER — Emergency Department (HOSPITAL_COMMUNITY): Admission: EM | Admit: 2008-10-08 | Discharge: 2008-10-09 | Payer: Self-pay | Admitting: Emergency Medicine

## 2008-10-13 ENCOUNTER — Encounter: Payer: Self-pay | Admitting: Family Medicine

## 2008-10-16 ENCOUNTER — Encounter: Payer: Self-pay | Admitting: Family Medicine

## 2008-10-21 ENCOUNTER — Ambulatory Visit: Payer: Self-pay | Admitting: Family Medicine

## 2008-10-21 DIAGNOSIS — M7532 Calcific tendinitis of left shoulder: Secondary | ICD-10-CM | POA: Insufficient documentation

## 2008-10-22 ENCOUNTER — Encounter: Payer: Self-pay | Admitting: Family Medicine

## 2008-10-26 ENCOUNTER — Ambulatory Visit: Payer: Self-pay | Admitting: Critical Care Medicine

## 2008-10-26 DIAGNOSIS — R0789 Other chest pain: Secondary | ICD-10-CM | POA: Insufficient documentation

## 2008-11-02 ENCOUNTER — Ambulatory Visit: Payer: Self-pay | Admitting: Family Medicine

## 2008-11-04 ENCOUNTER — Telehealth: Payer: Self-pay | Admitting: Family Medicine

## 2008-11-05 ENCOUNTER — Ambulatory Visit: Payer: Self-pay | Admitting: Critical Care Medicine

## 2008-11-08 ENCOUNTER — Encounter: Payer: Self-pay | Admitting: Critical Care Medicine

## 2008-11-09 ENCOUNTER — Telehealth: Payer: Self-pay | Admitting: Critical Care Medicine

## 2008-11-20 ENCOUNTER — Encounter: Payer: Self-pay | Admitting: Internal Medicine

## 2008-11-20 ENCOUNTER — Emergency Department (HOSPITAL_COMMUNITY): Admission: EM | Admit: 2008-11-20 | Discharge: 2008-11-20 | Payer: Self-pay | Admitting: Emergency Medicine

## 2008-11-20 ENCOUNTER — Telehealth (INDEPENDENT_AMBULATORY_CARE_PROVIDER_SITE_OTHER): Payer: Self-pay | Admitting: *Deleted

## 2008-11-20 ENCOUNTER — Encounter: Payer: Self-pay | Admitting: Family Medicine

## 2008-11-20 ENCOUNTER — Ambulatory Visit: Payer: Self-pay | Admitting: Internal Medicine

## 2008-11-21 ENCOUNTER — Telehealth: Payer: Self-pay | Admitting: Family Medicine

## 2008-11-23 ENCOUNTER — Ambulatory Visit: Payer: Self-pay | Admitting: Family Medicine

## 2008-11-23 LAB — CONVERTED CEMR LAB
Bilirubin Urine: NEGATIVE
Blood in Urine, dipstick: NEGATIVE
Glucose, Urine, Semiquant: NEGATIVE
Ketones, urine, test strip: NEGATIVE
Nitrite: NEGATIVE
Protein, U semiquant: 30
Specific Gravity, Urine: >=1.03
Urobilinogen, UA: 0.2
WBC Urine, dipstick: NEGATIVE
pH: 5.5

## 2008-11-23 IMAGING — CR DG ABDOMEN ACUTE W/ 1V CHEST
3 series · 3 of 3 positions shown · non-contrast
Comparison: none

CLINICAL DATA: Right-sided abdominal pain.  Nausea.
 ACUTE ABDOMINAL SERIES WITH CHEST - 3 VIEW:

[w chest pa]
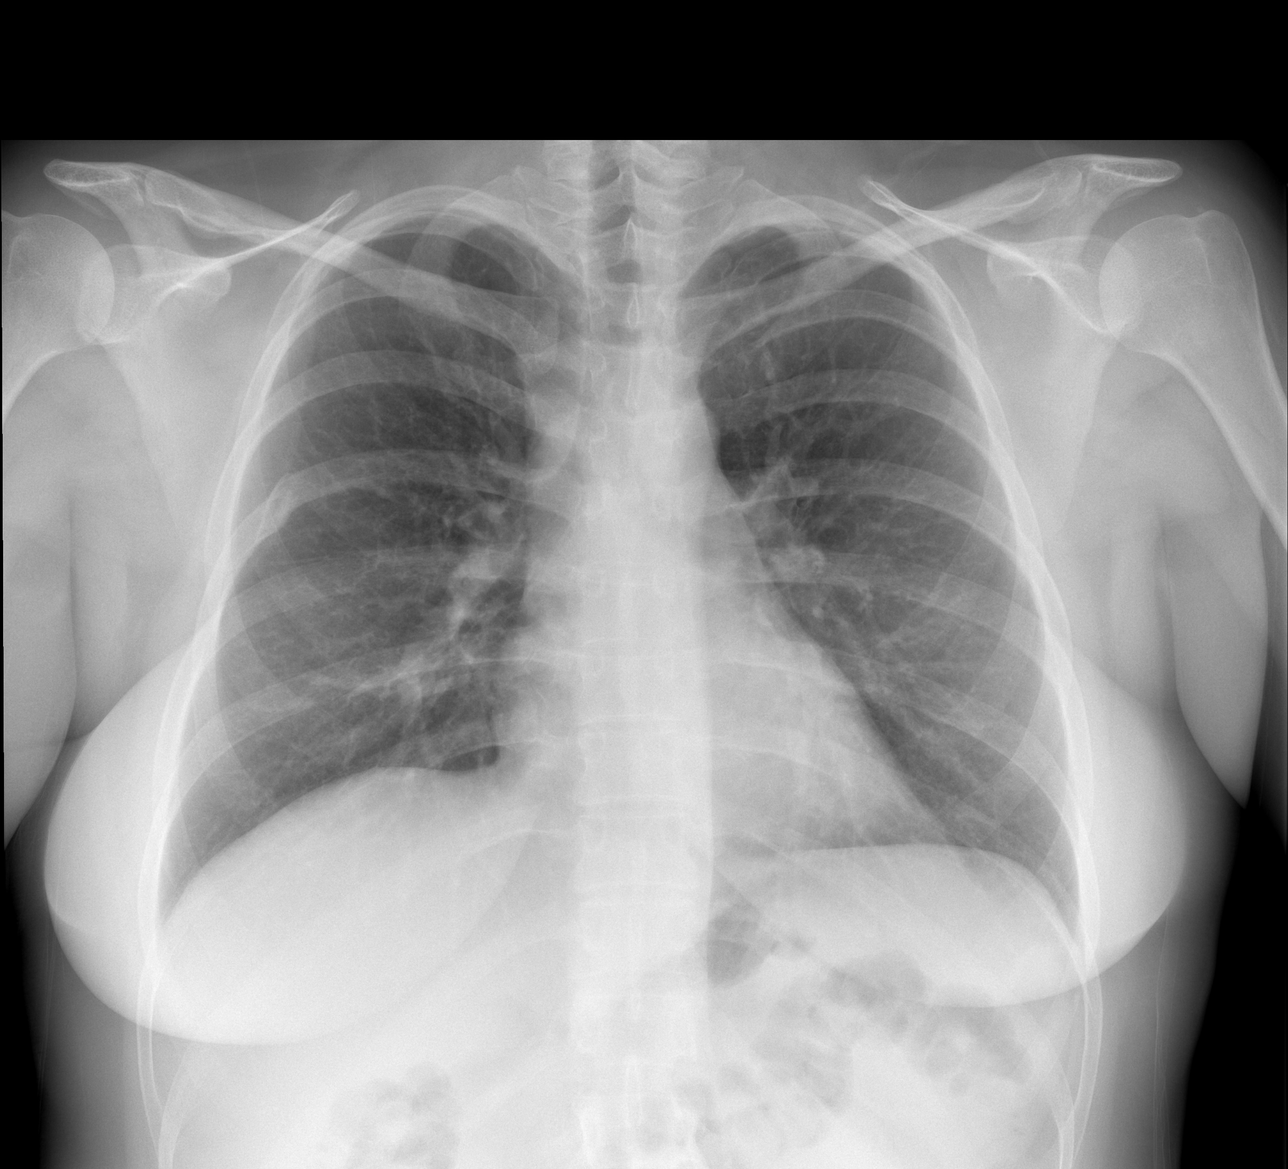

[w abdomen upright]
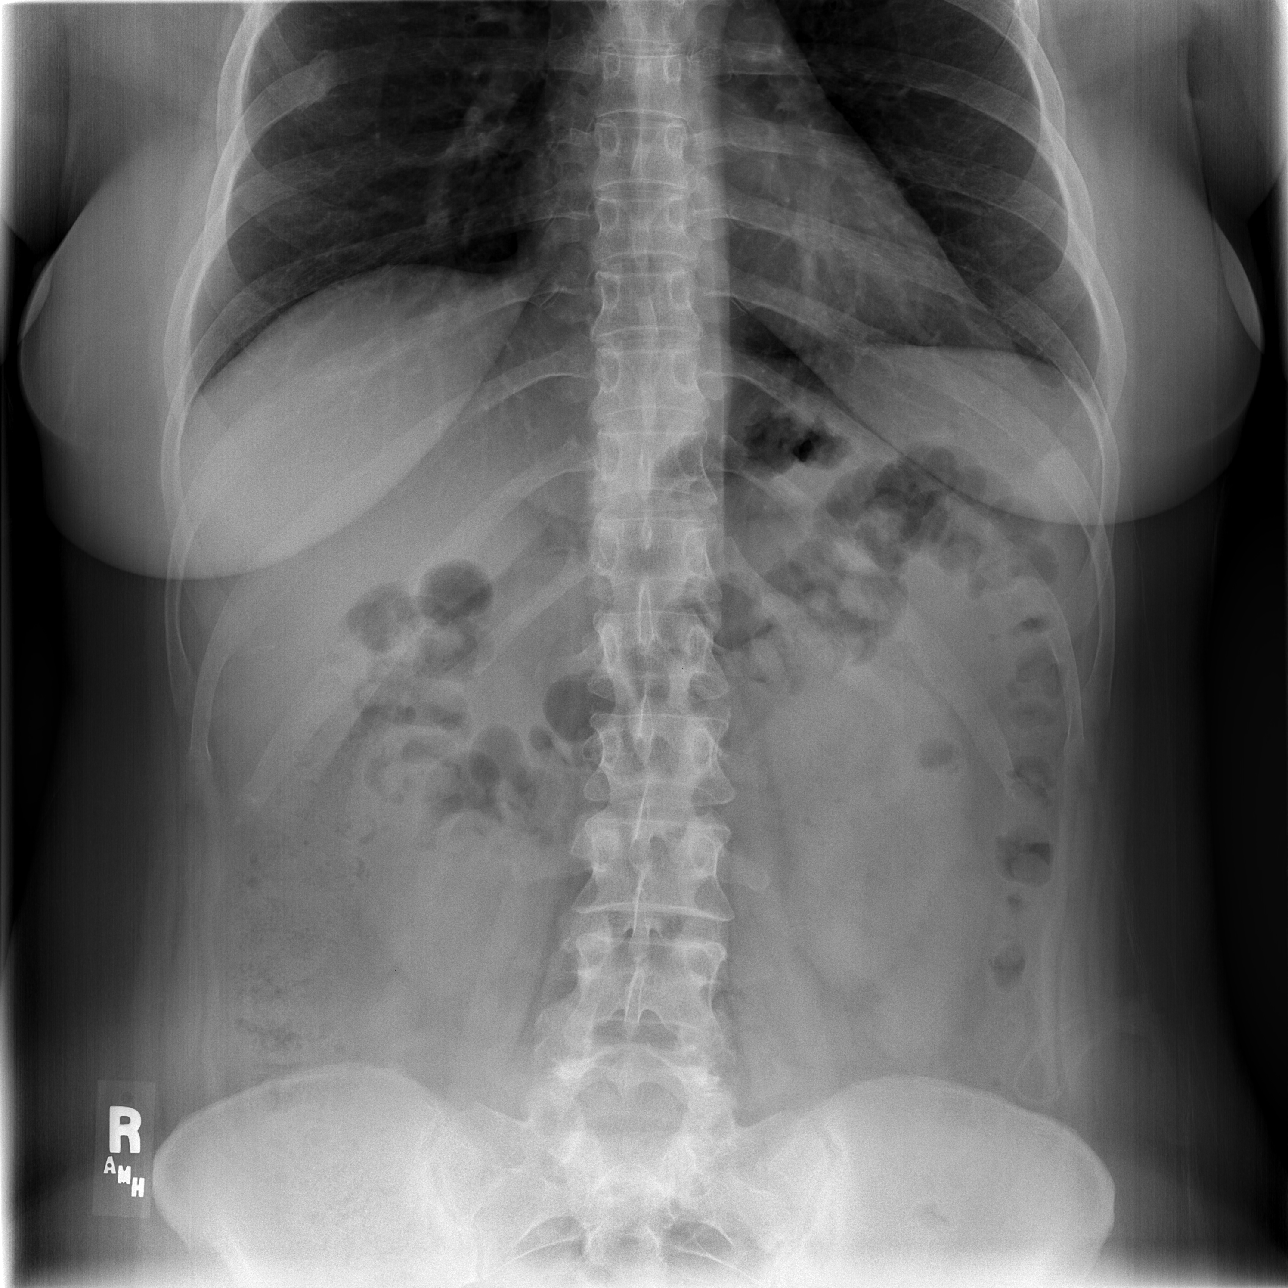

[t abdomen supine]
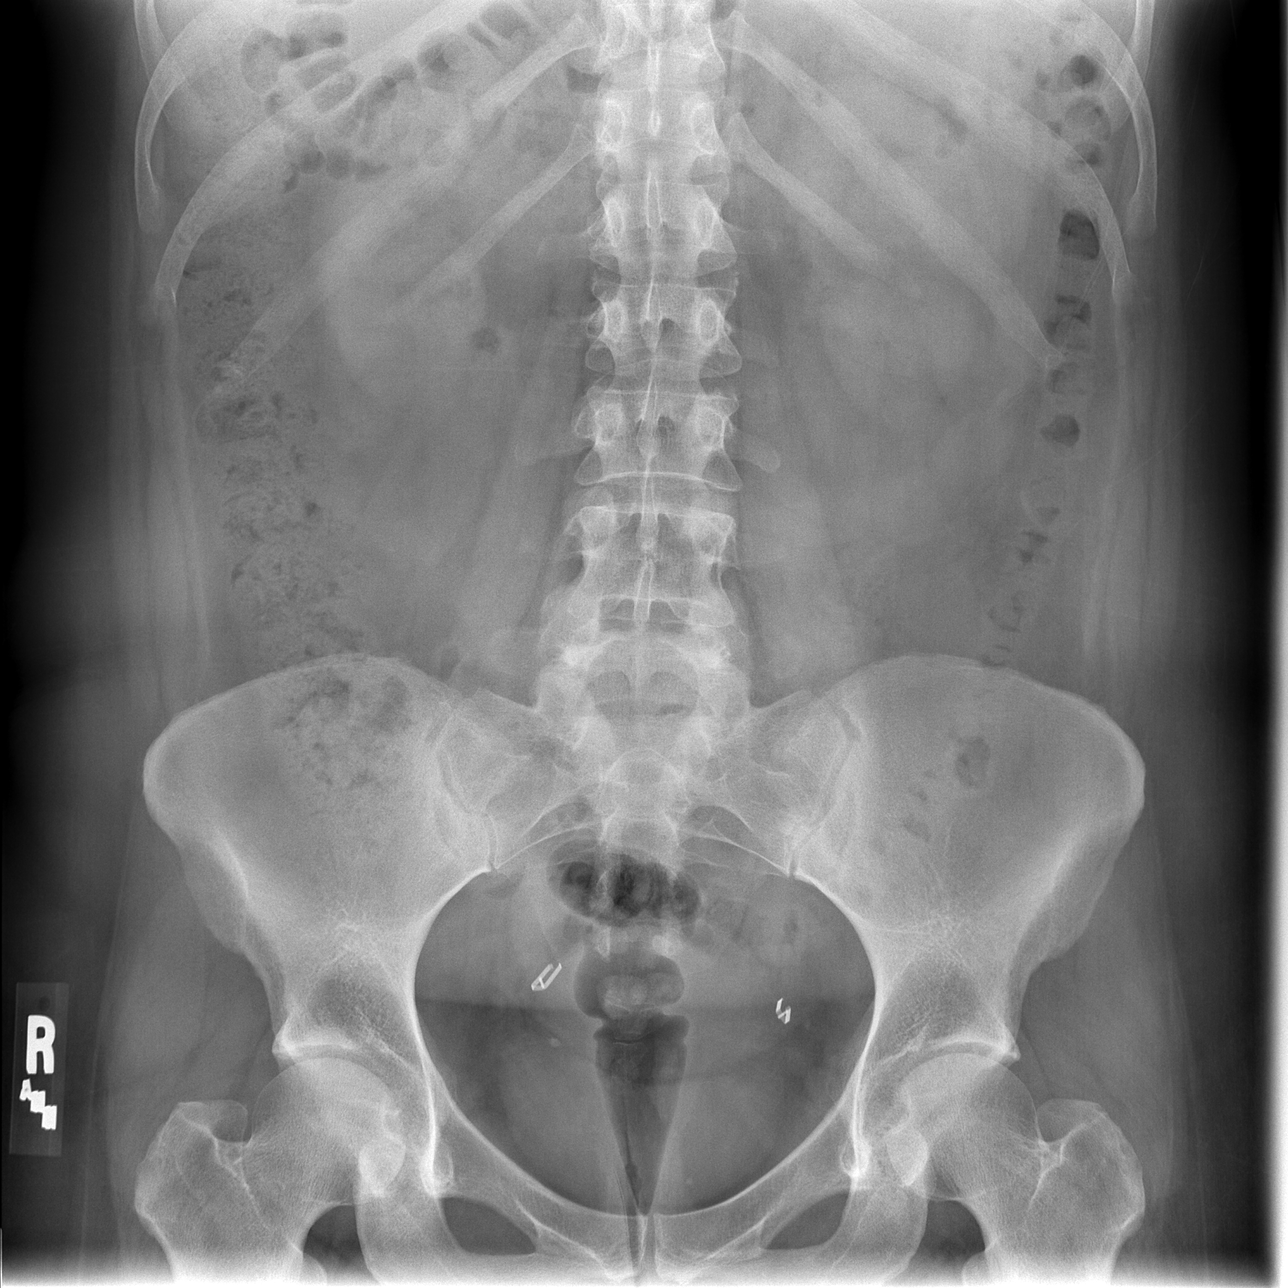

[3 of 3 positions shown; findings below may reference images not displayed]

FINDINGS: The bowel gas pattern is normal.  Clips are seen in the pelvis from previous bilateral tubal ligation.  A small radiodensity is seen in the right pelvis in the expected region of the right ureterovesical junction, and a distal ureteral calculus cannot be excluded.  
 Both lungs are clear.  Heart size and mediastinal contours are normal.  Old right posterior 6th rib fracture is noted.
IMPRESSION: 1.  Small calcification in the right pelvis in expected region of the ureterovesical junction.  Distal ureteral calculus cannot be excluded.  Consider noncontrast CT for further evaluation.
 2.  No active cardiopulmonary disease.

## 2008-11-23 IMAGING — CT CT ABDOMEN W/O CM
2 of 4 series · 13 of 32 positions shown, 19 images · IV contrast (agent unspecified)
Comparison: None.

CLINICAL DATA: Lower quadrant pain and side pain after electrophysiology study on 08/27/07.
ABDOMEN CT WITHOUT CONTRAST:
TECHNIQUE: Multidetector CT imaging of the abdomen was performed following the standard protocol without IV contrast.
TECHNIQUE: Multidetector CT imaging of the pelvis was performed following the standard protocol without IV contrast.

[Series 2: greater than (id) · axial · 0.81mm/px · z∈[-455,-55]mm · 9 of 101 slices shown, 15 images]
[im 11/101  soft-tissue]
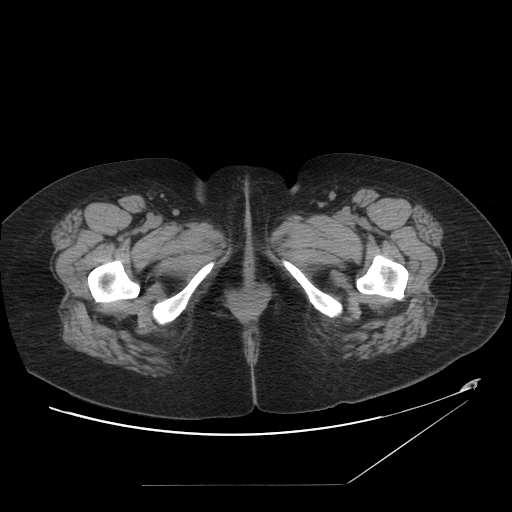
[im 11/101  bone]
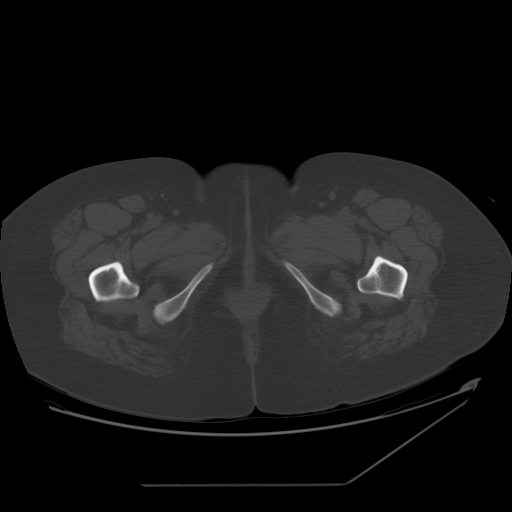
[im 21/101  soft-tissue]
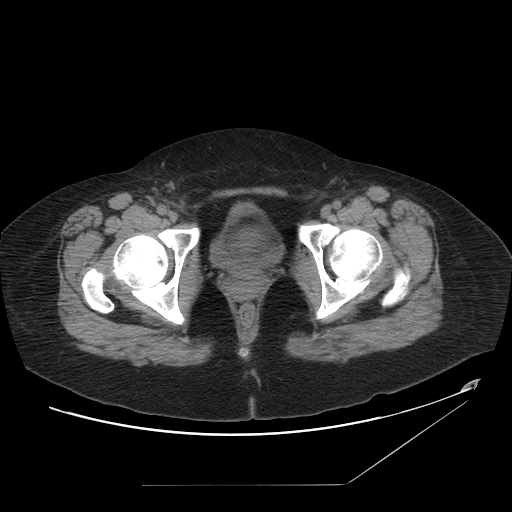
[im 31/101  soft-tissue]
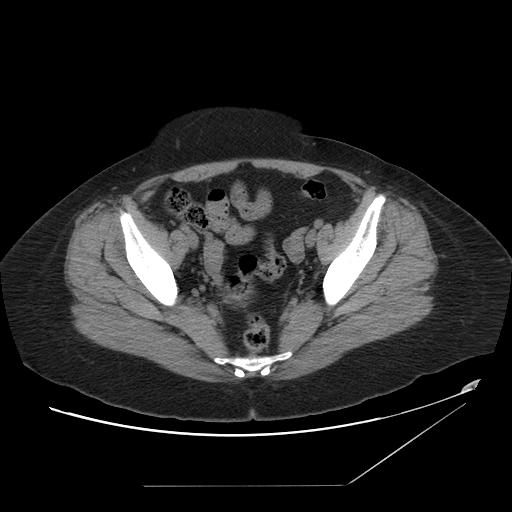
[im 41/101  soft-tissue]
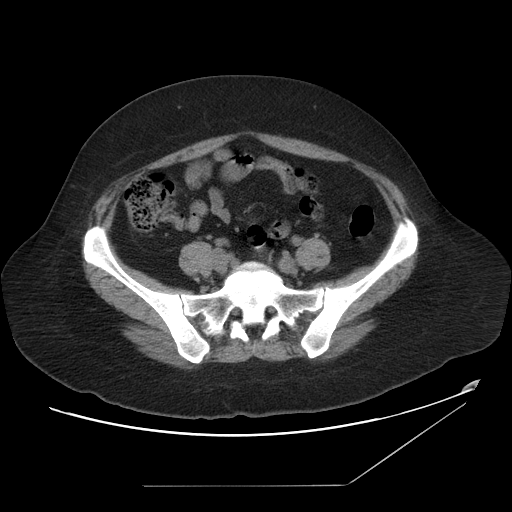
[im 51/101  soft-tissue]
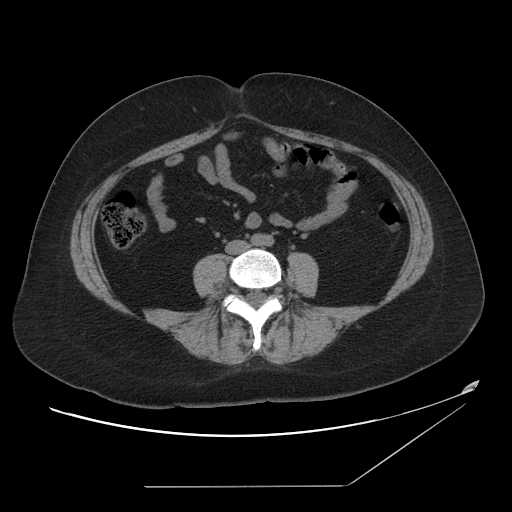
[im 61/101  soft-tissue]
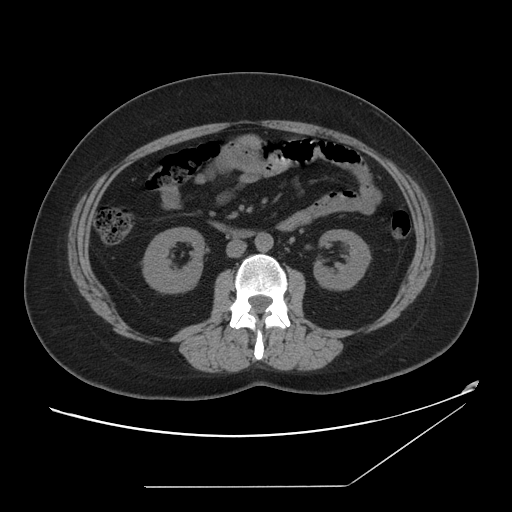
[im 61/101  lung]
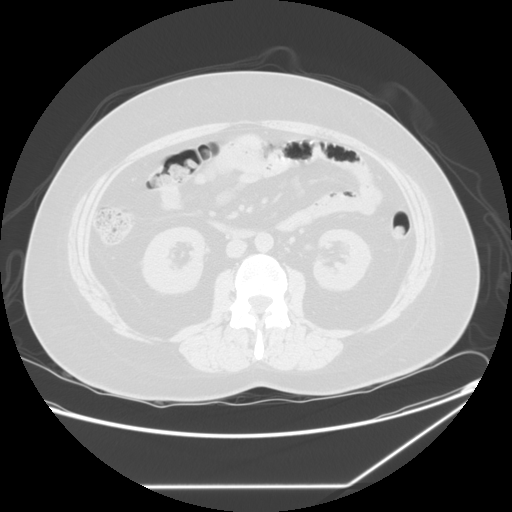
[im 71/101  soft-tissue]
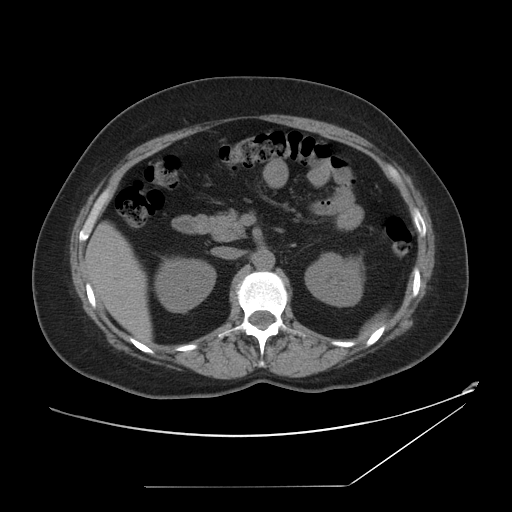
[im 71/101  lung]
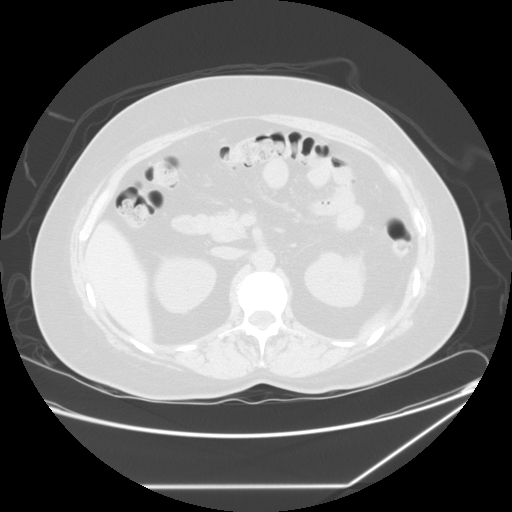
[im 81/101  soft-tissue]
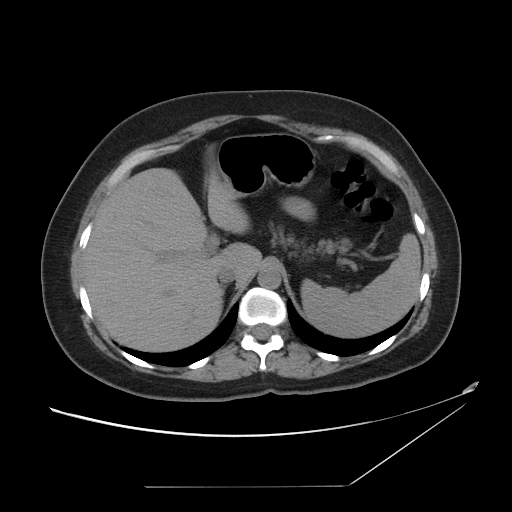
[im 81/101  lung]
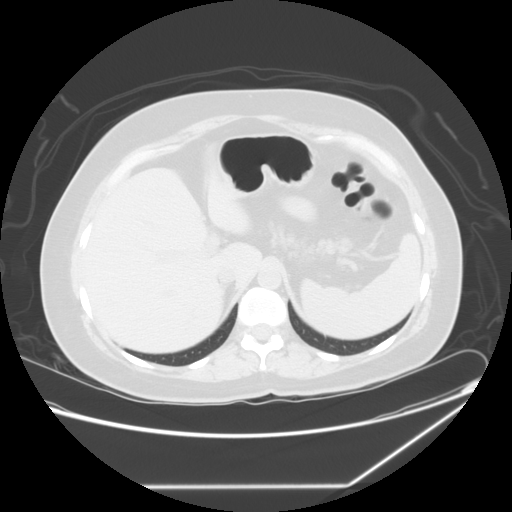
[im 91/101  soft-tissue]
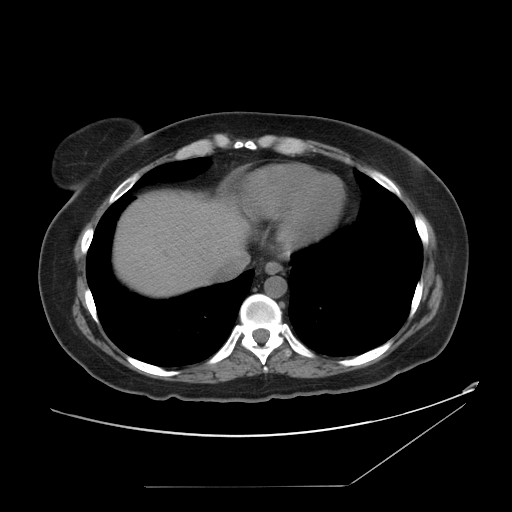
[im 91/101  lung]
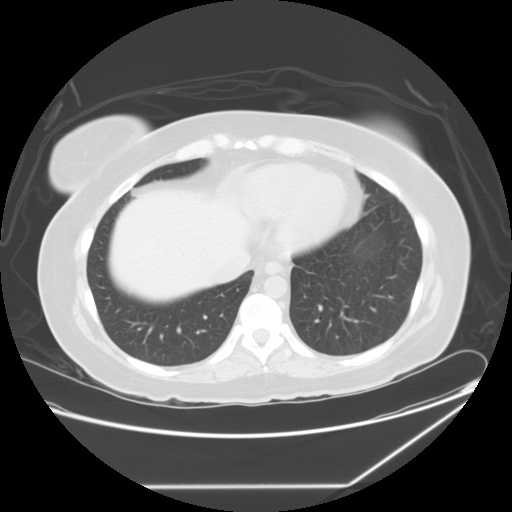
[im 91/101  bone]
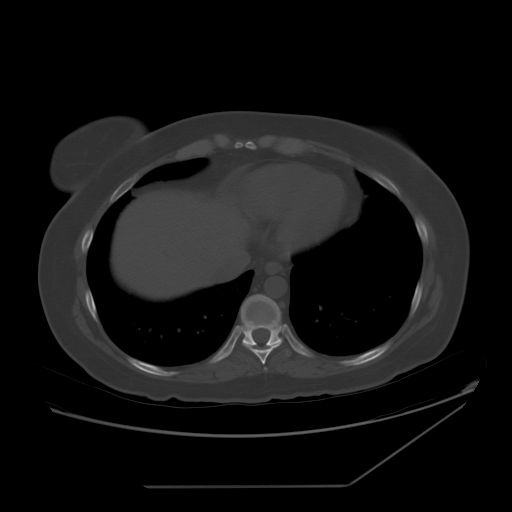

[Series 400: reformatted · sagittal · 1.00mm/px · 4 of 94 slices shown]
[im 11/94  soft-tissue]
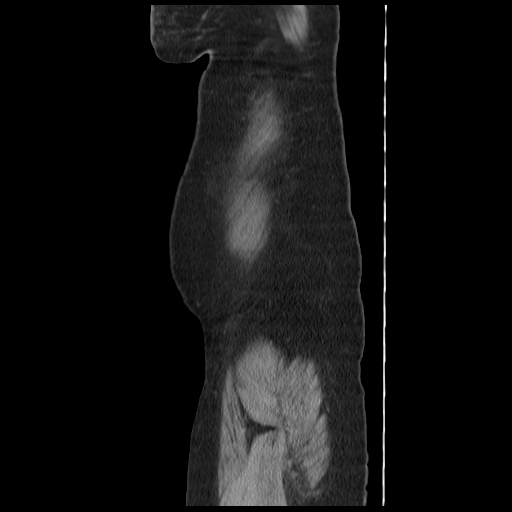
[im 21/94  soft-tissue]
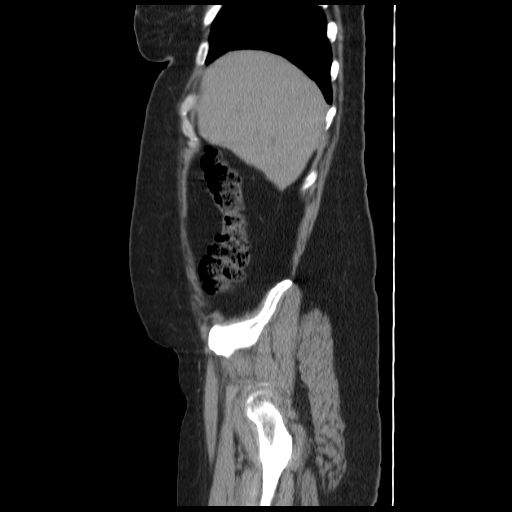
[im 32/94  soft-tissue]
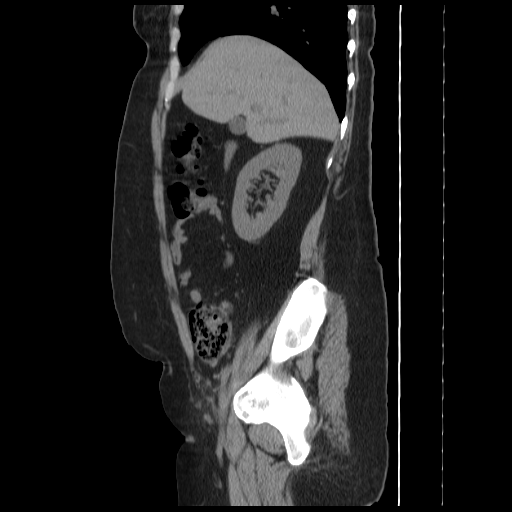
[im 42/94  soft-tissue]
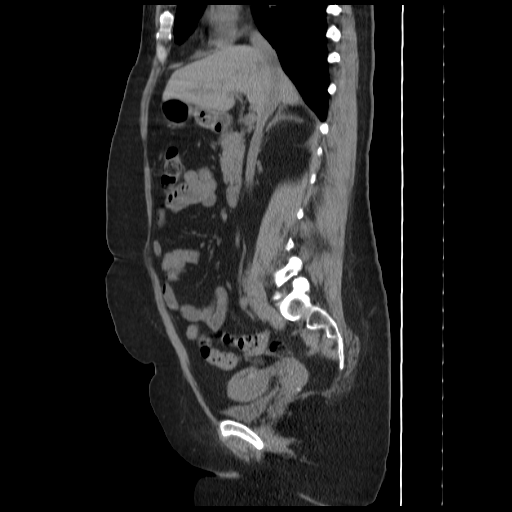

[13 of 32 positions shown; findings below may reference images not displayed]

FINDINGS: The lung bases are clear.
The liver is normal in attenuation and morphology.
The spleen is normal.
Both of the adrenal glands are normal.
The pancreas is normal.
There are no enlarged retroperitoneal lymph nodes.  No small bowel mesenteric lymph nodes. 
The bowel loops of the upper abdomen are unremarkable.  
There is a nonobstructing stone within the lower pole collecting system of the left kidney, which measures 6.9 mm.  
There is no evidence for obstructive uropathy. 
No free fluid or abnormal fluid collections are noted.
Review of the bone windows is negative.
IMPRESSION: 1.  No acute upper abdominal CT findings. 
2.  Left renal stone without evidence for obstructive uropathy.
PELVIS CT WITHOUT CONTRAST:
FINDINGS: The patient is status post bilateral tubal ligation.
There is no free fluid or abnormal fluid collections. 
No enlarged pelvic or inguinal lymph nodes. 
There is no evidence for growing hematoma.
Review of the bone windows shows no acute pelvic CT findings.
IMPRESSION: No acute pelvic CT findings.  Specifically, there is no evidence for groin hematoma.

## 2008-11-24 ENCOUNTER — Encounter: Payer: Self-pay | Admitting: Family Medicine

## 2008-11-25 ENCOUNTER — Encounter: Payer: Self-pay | Admitting: Family Medicine

## 2008-11-27 ENCOUNTER — Ambulatory Visit: Payer: Self-pay | Admitting: Family Medicine

## 2008-11-28 ENCOUNTER — Ambulatory Visit: Payer: Self-pay | Admitting: Diagnostic Radiology

## 2008-11-28 ENCOUNTER — Emergency Department (HOSPITAL_BASED_OUTPATIENT_CLINIC_OR_DEPARTMENT_OTHER): Admission: EM | Admit: 2008-11-28 | Discharge: 2008-11-28 | Payer: Self-pay | Admitting: Emergency Medicine

## 2008-12-02 ENCOUNTER — Encounter: Payer: Self-pay | Admitting: Internal Medicine

## 2008-12-02 ENCOUNTER — Ambulatory Visit: Payer: Self-pay | Admitting: Internal Medicine

## 2008-12-11 ENCOUNTER — Emergency Department (HOSPITAL_BASED_OUTPATIENT_CLINIC_OR_DEPARTMENT_OTHER): Admission: EM | Admit: 2008-12-11 | Discharge: 2008-12-11 | Payer: Self-pay | Admitting: Emergency Medicine

## 2008-12-11 ENCOUNTER — Ambulatory Visit: Payer: Self-pay | Admitting: Interventional Radiology

## 2008-12-15 ENCOUNTER — Encounter: Payer: Self-pay | Admitting: Family Medicine

## 2008-12-18 ENCOUNTER — Encounter: Payer: Self-pay | Admitting: Family Medicine

## 2008-12-24 ENCOUNTER — Encounter: Payer: Self-pay | Admitting: Family Medicine

## 2008-12-25 ENCOUNTER — Encounter: Payer: Self-pay | Admitting: Family Medicine

## 2009-01-08 ENCOUNTER — Emergency Department (HOSPITAL_BASED_OUTPATIENT_CLINIC_OR_DEPARTMENT_OTHER): Admission: EM | Admit: 2009-01-08 | Discharge: 2009-01-09 | Payer: Self-pay | Admitting: Emergency Medicine

## 2009-01-12 ENCOUNTER — Ambulatory Visit: Payer: Self-pay | Admitting: Family Medicine

## 2009-01-14 ENCOUNTER — Ambulatory Visit: Payer: Self-pay | Admitting: Critical Care Medicine

## 2009-01-18 ENCOUNTER — Encounter: Payer: Self-pay | Admitting: Family Medicine

## 2009-01-22 ENCOUNTER — Emergency Department (HOSPITAL_BASED_OUTPATIENT_CLINIC_OR_DEPARTMENT_OTHER): Admission: EM | Admit: 2009-01-22 | Discharge: 2009-01-22 | Payer: Self-pay | Admitting: Emergency Medicine

## 2009-01-24 ENCOUNTER — Emergency Department (HOSPITAL_BASED_OUTPATIENT_CLINIC_OR_DEPARTMENT_OTHER): Admission: EM | Admit: 2009-01-24 | Discharge: 2009-01-25 | Payer: Self-pay | Admitting: Emergency Medicine

## 2009-01-25 ENCOUNTER — Ambulatory Visit: Payer: Self-pay | Admitting: Diagnostic Radiology

## 2009-01-31 ENCOUNTER — Encounter: Payer: Self-pay | Admitting: Critical Care Medicine

## 2009-02-01 ENCOUNTER — Encounter: Payer: Self-pay | Admitting: Critical Care Medicine

## 2009-02-02 ENCOUNTER — Ambulatory Visit: Payer: Self-pay | Admitting: Family Medicine

## 2009-02-03 ENCOUNTER — Encounter: Payer: Self-pay | Admitting: Critical Care Medicine

## 2009-02-03 ENCOUNTER — Emergency Department (HOSPITAL_COMMUNITY): Admission: EM | Admit: 2009-02-03 | Discharge: 2009-02-03 | Payer: Self-pay | Admitting: Emergency Medicine

## 2009-02-08 ENCOUNTER — Ambulatory Visit: Payer: Self-pay | Admitting: Critical Care Medicine

## 2009-02-08 ENCOUNTER — Telehealth (INDEPENDENT_AMBULATORY_CARE_PROVIDER_SITE_OTHER): Payer: Self-pay | Admitting: *Deleted

## 2009-02-12 ENCOUNTER — Telehealth (INDEPENDENT_AMBULATORY_CARE_PROVIDER_SITE_OTHER): Payer: Self-pay | Admitting: *Deleted

## 2009-02-12 ENCOUNTER — Encounter: Payer: Self-pay | Admitting: Family Medicine

## 2009-02-16 ENCOUNTER — Ambulatory Visit: Payer: Self-pay | Admitting: Diagnostic Radiology

## 2009-02-16 ENCOUNTER — Emergency Department (HOSPITAL_BASED_OUTPATIENT_CLINIC_OR_DEPARTMENT_OTHER): Admission: EM | Admit: 2009-02-16 | Discharge: 2009-02-16 | Payer: Self-pay | Admitting: Emergency Medicine

## 2009-02-17 ENCOUNTER — Encounter: Payer: Self-pay | Admitting: Family Medicine

## 2009-02-18 ENCOUNTER — Ambulatory Visit: Payer: Self-pay | Admitting: Critical Care Medicine

## 2009-02-25 ENCOUNTER — Encounter: Admission: RE | Admit: 2009-02-25 | Discharge: 2009-02-25 | Payer: Self-pay | Admitting: Family Medicine

## 2009-02-25 ENCOUNTER — Ambulatory Visit: Payer: Self-pay | Admitting: Family Medicine

## 2009-02-26 IMAGING — CT CT ABDOMEN W/O CM
2 of 5 series · 17 of 46 positions shown, 19 images · non-contrast
Comparison: CT abdomen pelvis 08/31/2007 the

CT ABDOMEN

CLINICAL DATA: Severe right-sided abdominal pain

CT ABDOMEN AND PELVIS WITHOUT CONTRAST
TECHNIQUE: Multidetector CT imaging of the abdomen and pelvis was
performed following the standard
protocol without intravenous contrast.

[Series 2: renal stone 5.0 b31f st · axial · 0.68mm/px · z∈[-596,-151]mm · 14 of 99 slices shown, 16 images]
[im 5/99  soft-tissue]
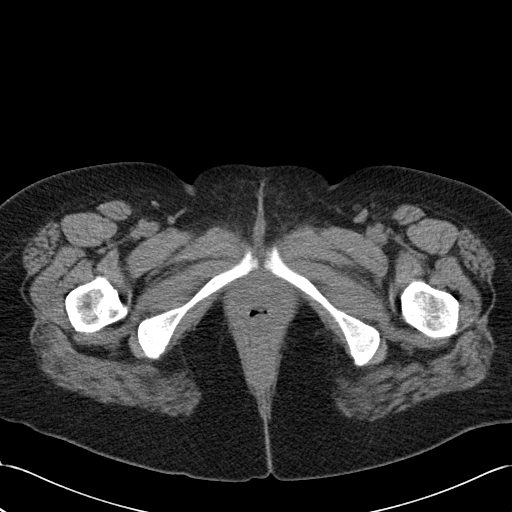
[im 5/99  bone]
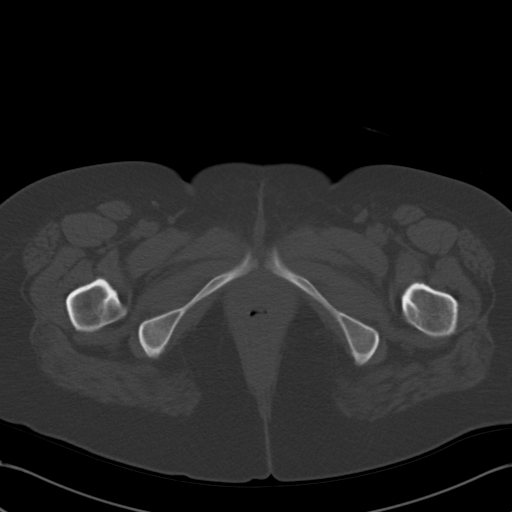
[im 13/99  soft-tissue]
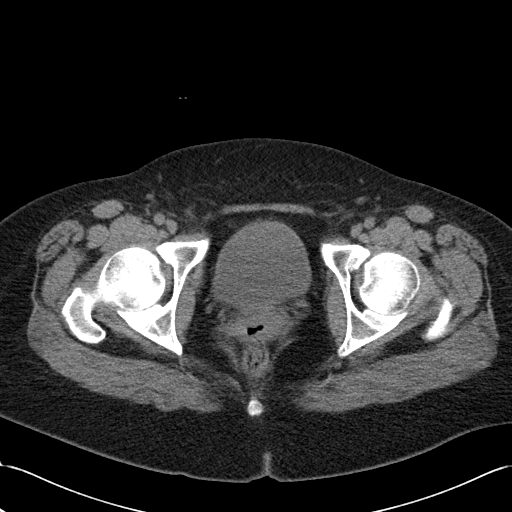
[im 21/99  soft-tissue]
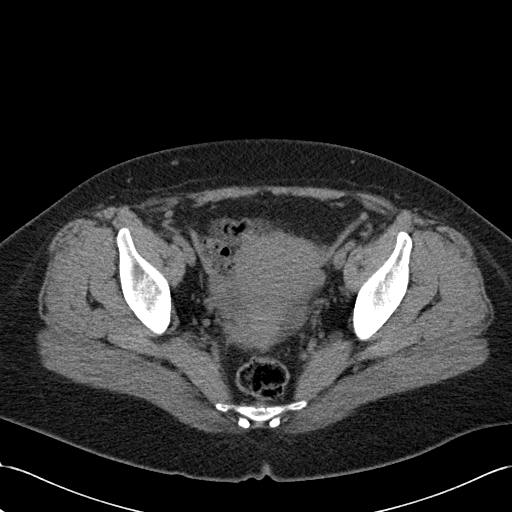
[im 25/99  soft-tissue]
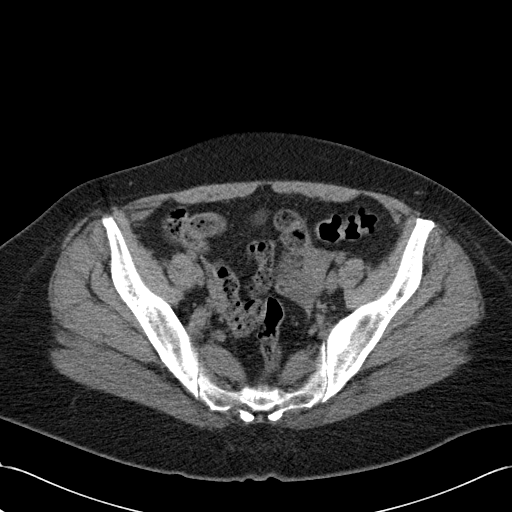
[im 33/99  soft-tissue]
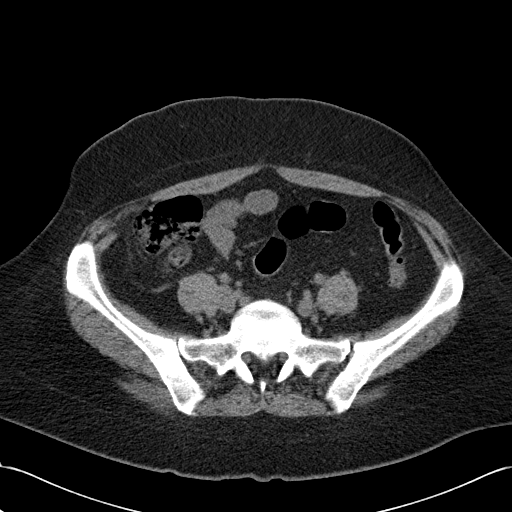
[im 41/99  soft-tissue]
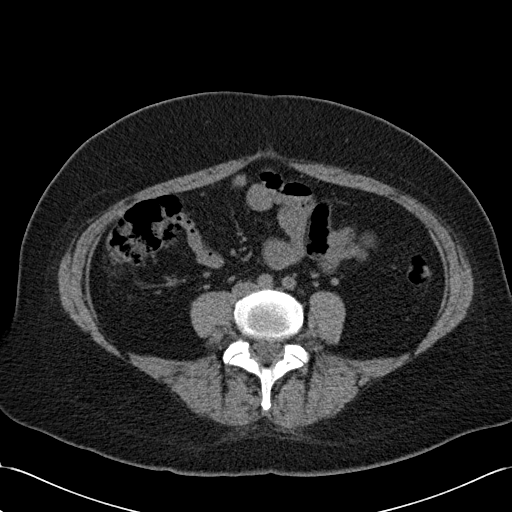
[im 45/99  soft-tissue]
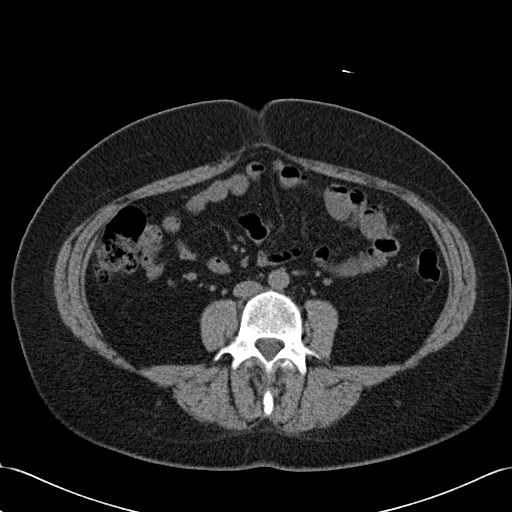
[im 54/99  soft-tissue]
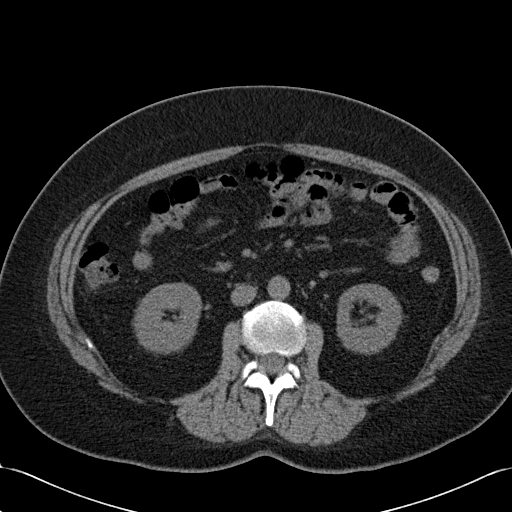
[im 58/99  soft-tissue]
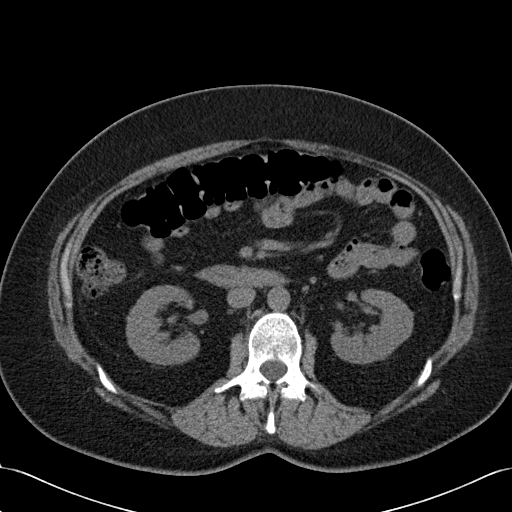
[im 58/99  bone]
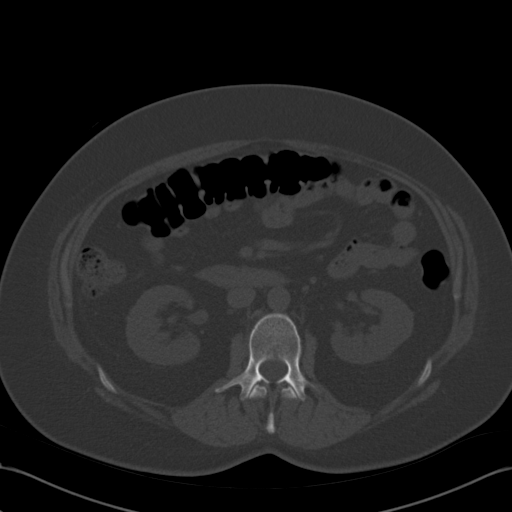
[im 66/99  soft-tissue]
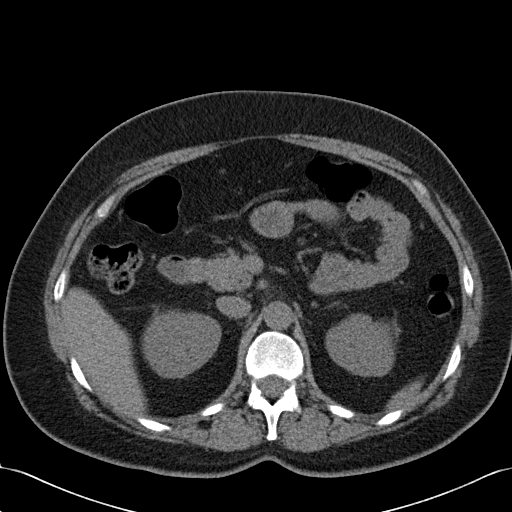
[im 74/99  soft-tissue]
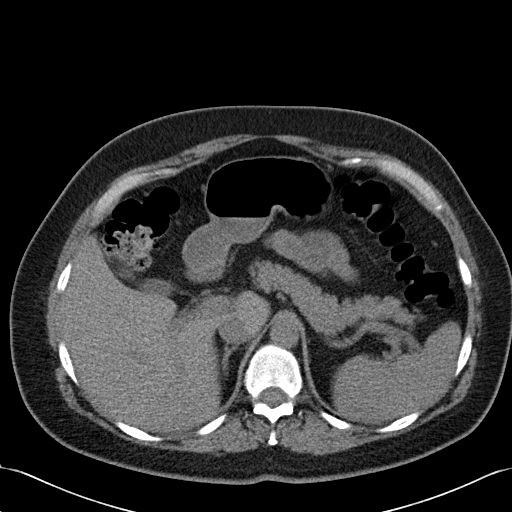
[im 78/99  soft-tissue]
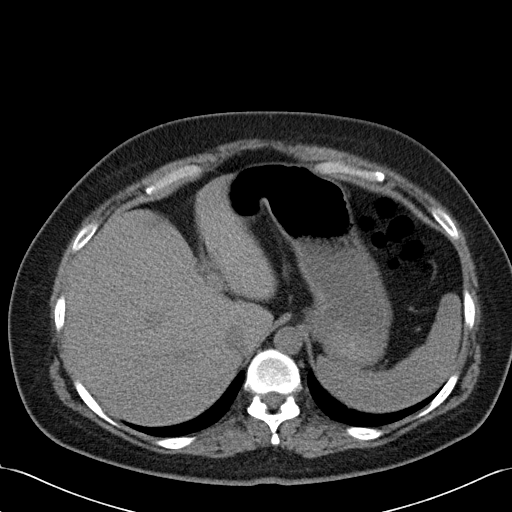
[im 86/99  soft-tissue]
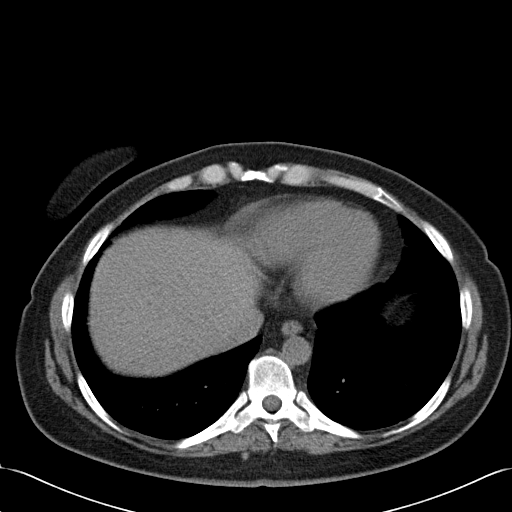
[im 94/99  soft-tissue]
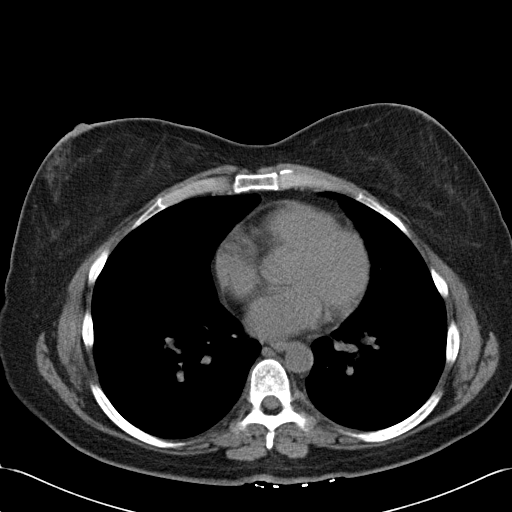

[Series 7: renal stone 2.0 spo cor st · coronal · 0.96mm/px · 3 of 116 slices shown]
[im 39/116  soft-tissue]
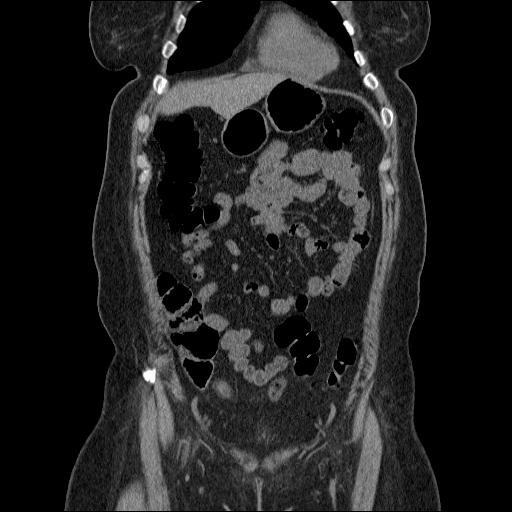
[im 52/116  soft-tissue]
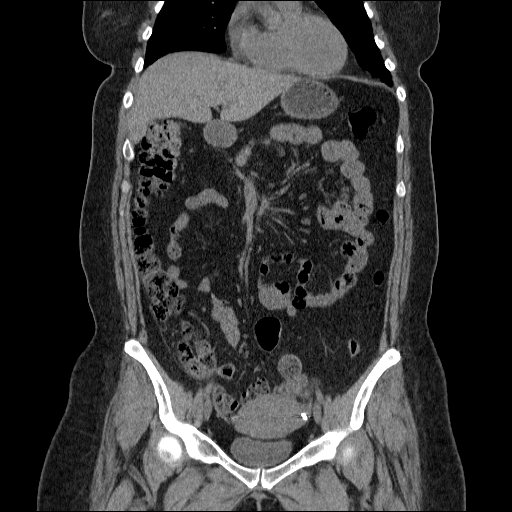
[im 64/116  soft-tissue]
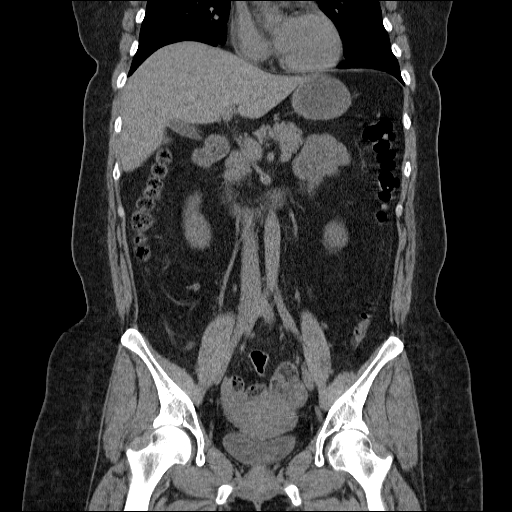

[17 of 46 positions shown; findings below may reference images not displayed]

FINDINGS: Pleural thickening versus nodule at the right lung base
just superior to the diaphragm measures 10 mm on image 10, this
appears increased from prior.  Otherwise lung bases are clear.

No focal hepatic lesion.  The gallbladder is collapsed.  The
pancreas is normal.  The spleen and adrenal glands are normal.

No evidence of nephrolithiasis or obstructive uropathy.  No
evidence of ureteral lithiasis.  Within the inferior pole of the
left kidney there is a 7 mm high density lesion (image 48) which is
not significantly changed from prior.

Small bowel is normal in course and caliber.  The appendix is not
clearly visualized.  The colon appears normal
IMPRESSION: 1..  No evidence of nephrolithiasis or obstructive uropathy.
2..  Small 7 mm high density lesion in the lower pole of the left
kidney likely represents a hyperdense hemorrhagic cyst.  No
significant change from prior.
3..  A 10 mm nodule versus pleural thickening at the right lung
bases increased in size from prior.  Recommend follow-up CT
noncontrast scan of thorax in 6 to 12 months.

CT PELVIS
FINDINGS: No free fluid in the pelvis.  Tubal ligation clips
present.  Normal uterus.  The bladder is normal.  The rectum and
sigmoid colon appear normal.  The adnexa appear normal.

Review of bone windows demonstrates no aggressive osseous lesions.
No evidence of pelvic lymphadenopathy.
IMPRESSION: .  No acute pelvic process.

## 2009-02-26 IMAGING — CR DG CHEST 2V
2 series · 2 of 2 positions shown · non-contrast
Comparison: Radiograph 06/21/2007

CLINICAL DATA: Severe right lower chest pain

CHEST - 2 VIEW

[w chest lat]
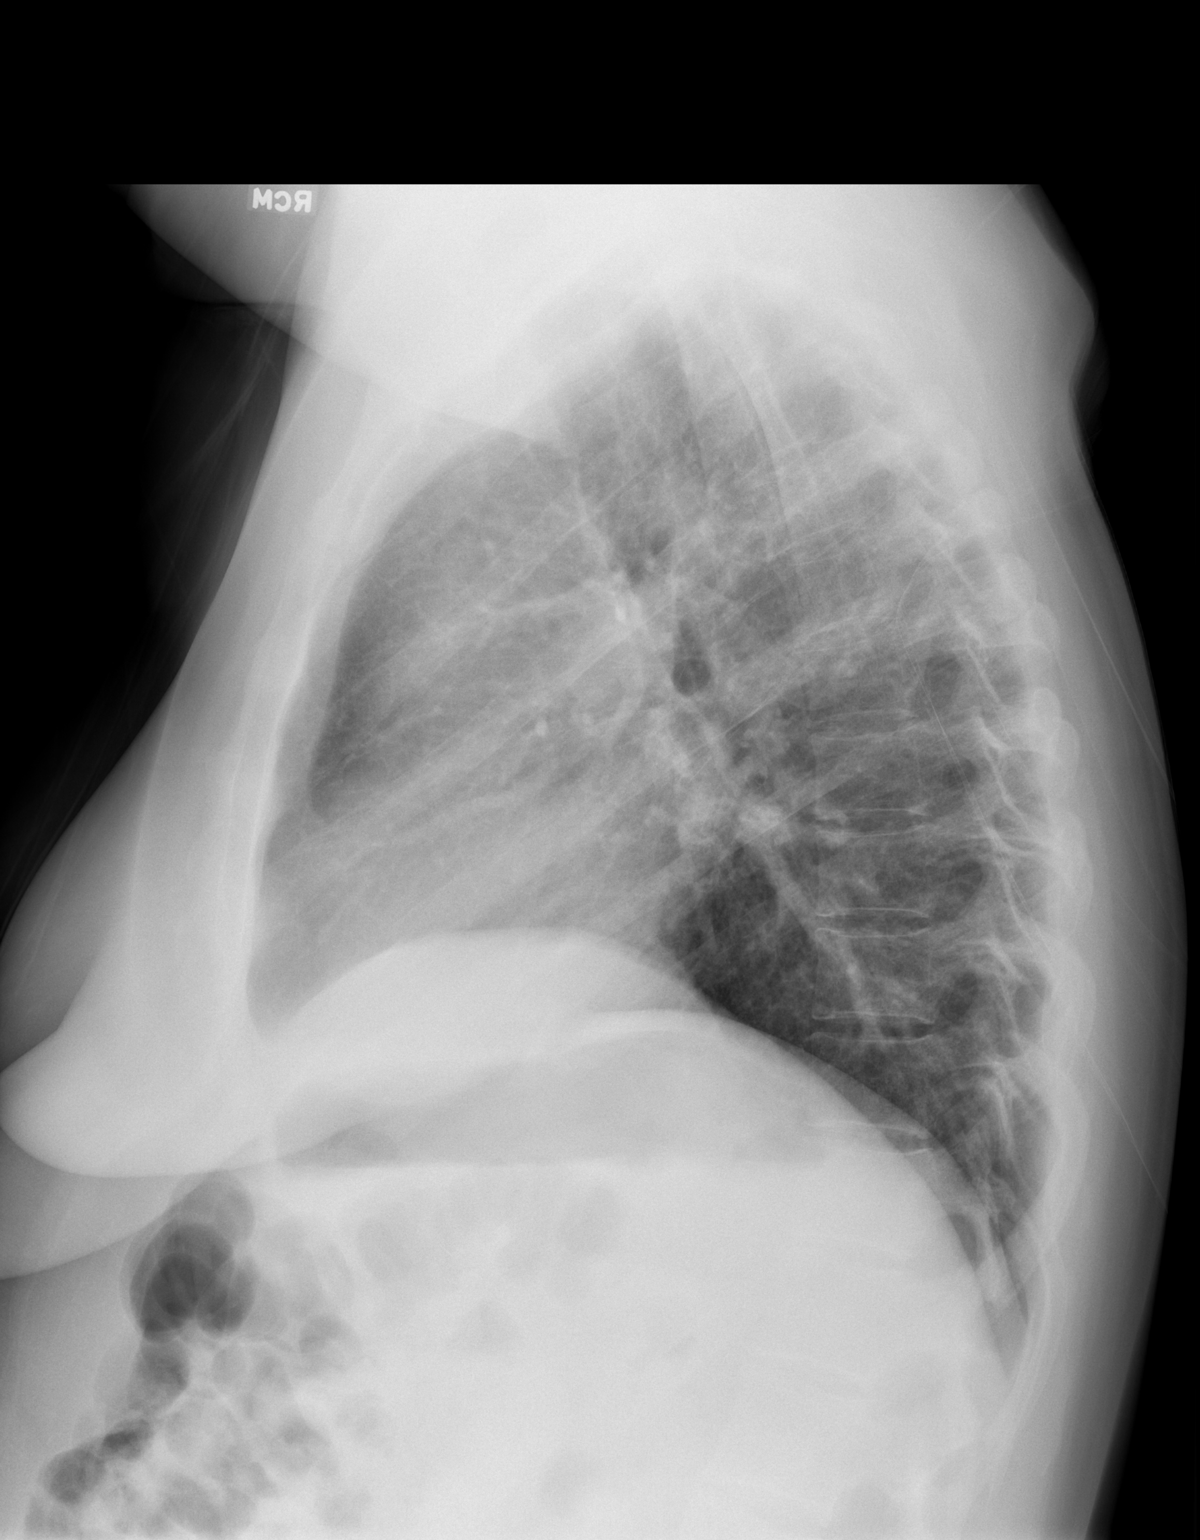

[w chest pa]
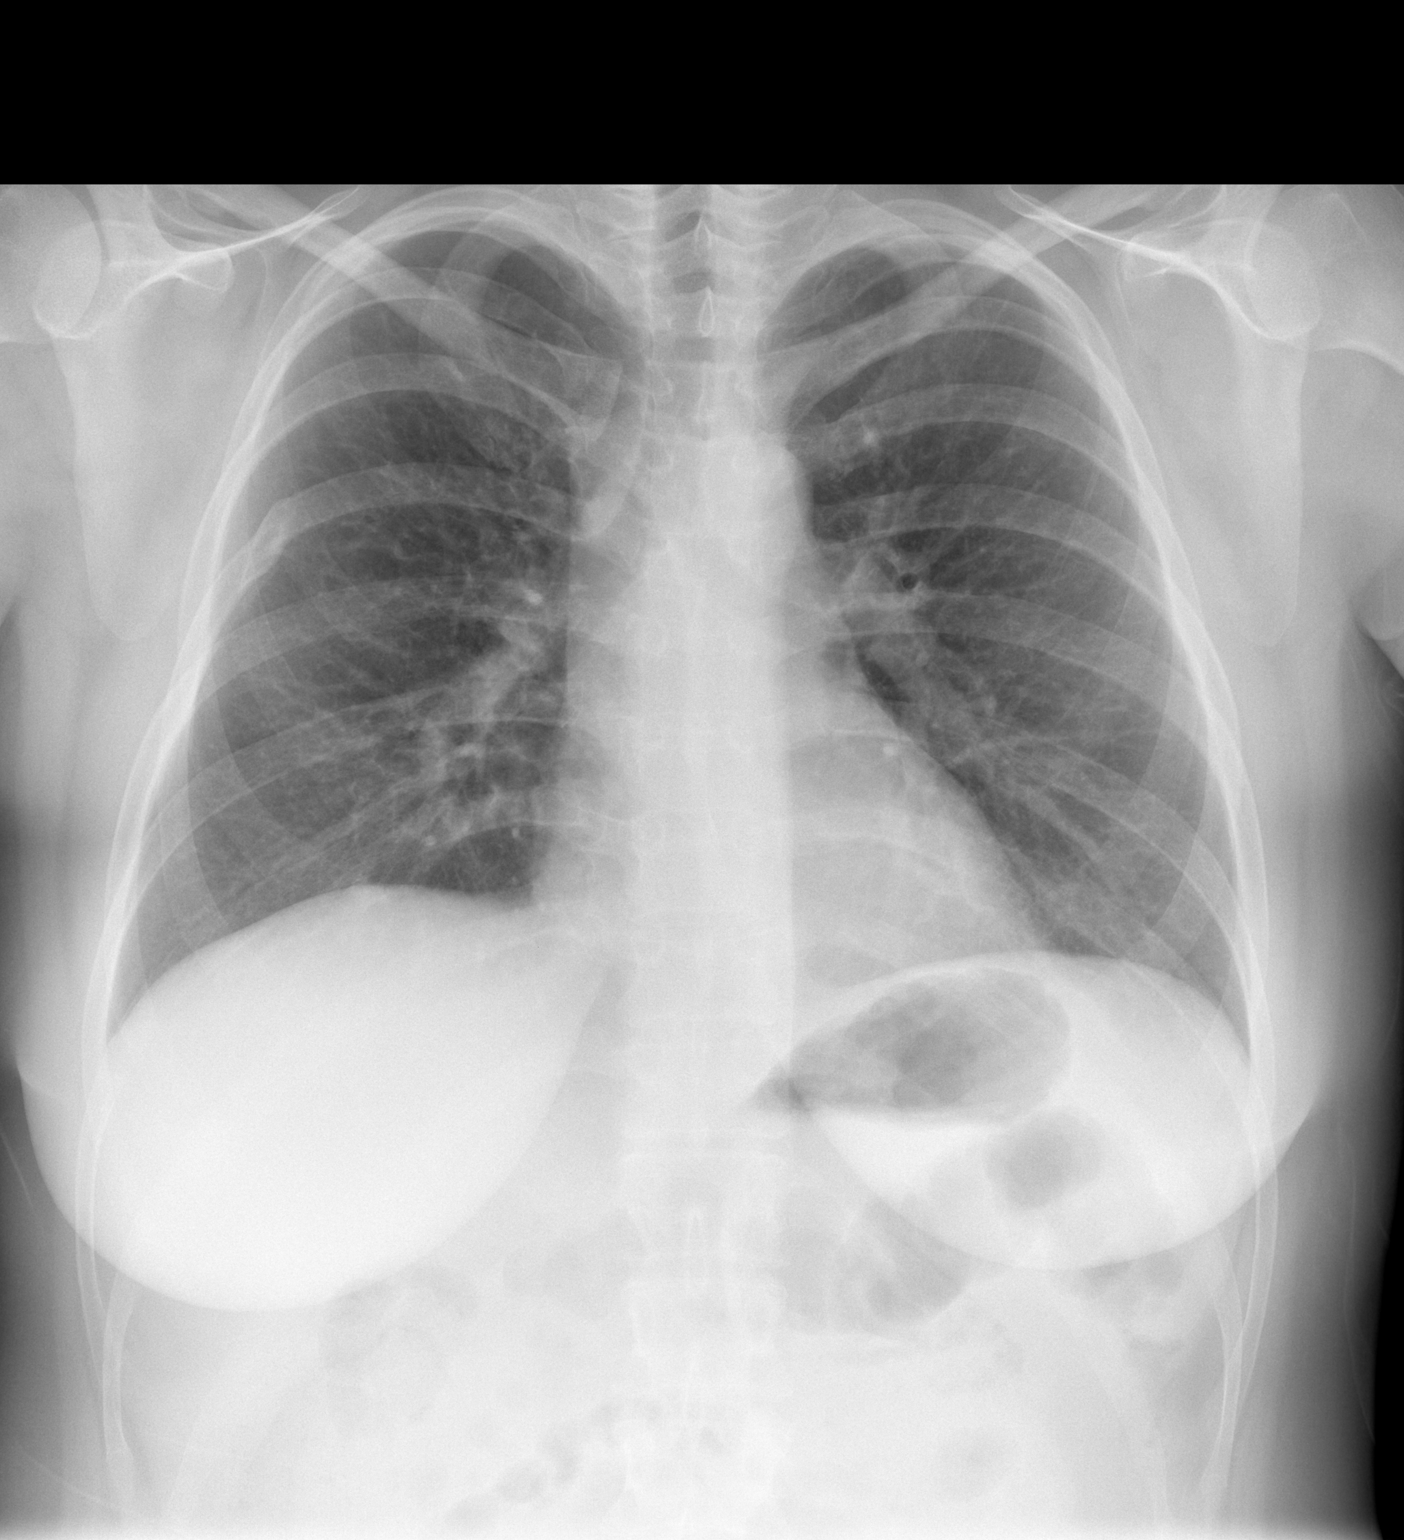

[2 of 2 positions shown; findings below may reference images not displayed]

FINDINGS: Normal mediastinum and cardiac silhouette.  Costophrenic
angles are clear.  No evidence effusion, infiltrate, or
pneumothorax. Healed right  rib fracture
IMPRESSION: No acute cardiopulmonary process.

## 2009-02-27 ENCOUNTER — Emergency Department (HOSPITAL_COMMUNITY): Admission: EM | Admit: 2009-02-27 | Discharge: 2009-02-28 | Payer: Self-pay | Admitting: Emergency Medicine

## 2009-03-01 ENCOUNTER — Telehealth: Payer: Self-pay | Admitting: Family Medicine

## 2009-03-01 ENCOUNTER — Encounter: Payer: Self-pay | Admitting: Critical Care Medicine

## 2009-03-09 ENCOUNTER — Encounter: Payer: Self-pay | Admitting: Family Medicine

## 2009-03-11 ENCOUNTER — Emergency Department (HOSPITAL_BASED_OUTPATIENT_CLINIC_OR_DEPARTMENT_OTHER): Admission: EM | Admit: 2009-03-11 | Discharge: 2009-03-11 | Payer: Self-pay | Admitting: Emergency Medicine

## 2009-03-13 ENCOUNTER — Encounter: Payer: Self-pay | Admitting: Family Medicine

## 2009-03-17 ENCOUNTER — Encounter: Admission: RE | Admit: 2009-03-17 | Discharge: 2009-03-17 | Payer: Self-pay | Admitting: Family Medicine

## 2009-03-17 ENCOUNTER — Ambulatory Visit: Payer: Self-pay | Admitting: Family Medicine

## 2009-03-18 ENCOUNTER — Encounter: Payer: Self-pay | Admitting: Family Medicine

## 2009-03-18 LAB — CONVERTED CEMR LAB
ALT: 13 units/L (ref 0–35)
AST: 14 units/L (ref 0–37)
Albumin: 4.1 g/dL (ref 3.5–5.2)
Alkaline Phosphatase: 47 units/L (ref 39–117)
Amylase: 50 units/L (ref 0–105)
BUN: 13 mg/dL (ref 6–23)
Basophils Absolute: 0 10*3/uL (ref 0.0–0.1)
Basophils Relative: 1 % (ref 0–1)
CO2: 20 meq/L (ref 19–32)
Calcium: 8.6 mg/dL (ref 8.4–10.5)
Chloride: 110 meq/L (ref 96–112)
Creatinine, Ser: 0.6 mg/dL (ref 0.40–1.20)
Eosinophils Absolute: 0.1 10*3/uL (ref 0.0–0.7)
Eosinophils Relative: 3 % (ref 0–5)
Glucose, Bld: 94 mg/dL (ref 70–99)
HCT: 35.6 % — ABNORMAL LOW (ref 36.0–46.0)
Hemoglobin: 10.7 g/dL — ABNORMAL LOW (ref 12.0–15.0)
Lipase: 43 units/L (ref 0–75)
Lymphocytes Relative: 34 % (ref 12–46)
Lymphs Abs: 1.3 10*3/uL (ref 0.7–4.0)
MCHC: 30.1 g/dL (ref 30.0–36.0)
MCV: 82.8 fL (ref 78.0–100.0)
Monocytes Absolute: 0.3 10*3/uL (ref 0.1–1.0)
Monocytes Relative: 7 % (ref 3–12)
Neutro Abs: 2.1 10*3/uL (ref 1.7–7.7)
Neutrophils Relative %: 55 % (ref 43–77)
Platelets: 242 10*3/uL (ref 150–400)
Potassium: 4.2 meq/L (ref 3.5–5.3)
RBC: 4.3 M/uL (ref 3.87–5.11)
RDW: 17.4 % — ABNORMAL HIGH (ref 11.5–15.5)
Sodium: 141 meq/L (ref 135–145)
Total Bilirubin: 0.3 mg/dL (ref 0.3–1.2)
Total Protein: 6.8 g/dL (ref 6.0–8.3)
WBC: 3.7 10*3/uL — ABNORMAL LOW (ref 4.0–10.5)

## 2009-03-22 ENCOUNTER — Ambulatory Visit: Payer: Self-pay | Admitting: Diagnostic Radiology

## 2009-03-22 ENCOUNTER — Emergency Department (HOSPITAL_BASED_OUTPATIENT_CLINIC_OR_DEPARTMENT_OTHER): Admission: EM | Admit: 2009-03-22 | Discharge: 2009-03-22 | Payer: Self-pay | Admitting: Emergency Medicine

## 2009-03-23 ENCOUNTER — Telehealth (INDEPENDENT_AMBULATORY_CARE_PROVIDER_SITE_OTHER): Payer: Self-pay | Admitting: *Deleted

## 2009-03-23 ENCOUNTER — Ambulatory Visit: Payer: Self-pay | Admitting: Pulmonary Disease

## 2009-03-30 ENCOUNTER — Ambulatory Visit: Payer: Self-pay | Admitting: Family Medicine

## 2009-04-09 ENCOUNTER — Ambulatory Visit: Payer: Self-pay | Admitting: Family Medicine

## 2009-04-13 IMAGING — CR DG CHEST 2V
2 series · 2 of 2 positions shown · non-contrast
Comparison: 12/04/2007

CLINICAL DATA: Chest pain

CHEST - 2 VIEW

[w chest pa]
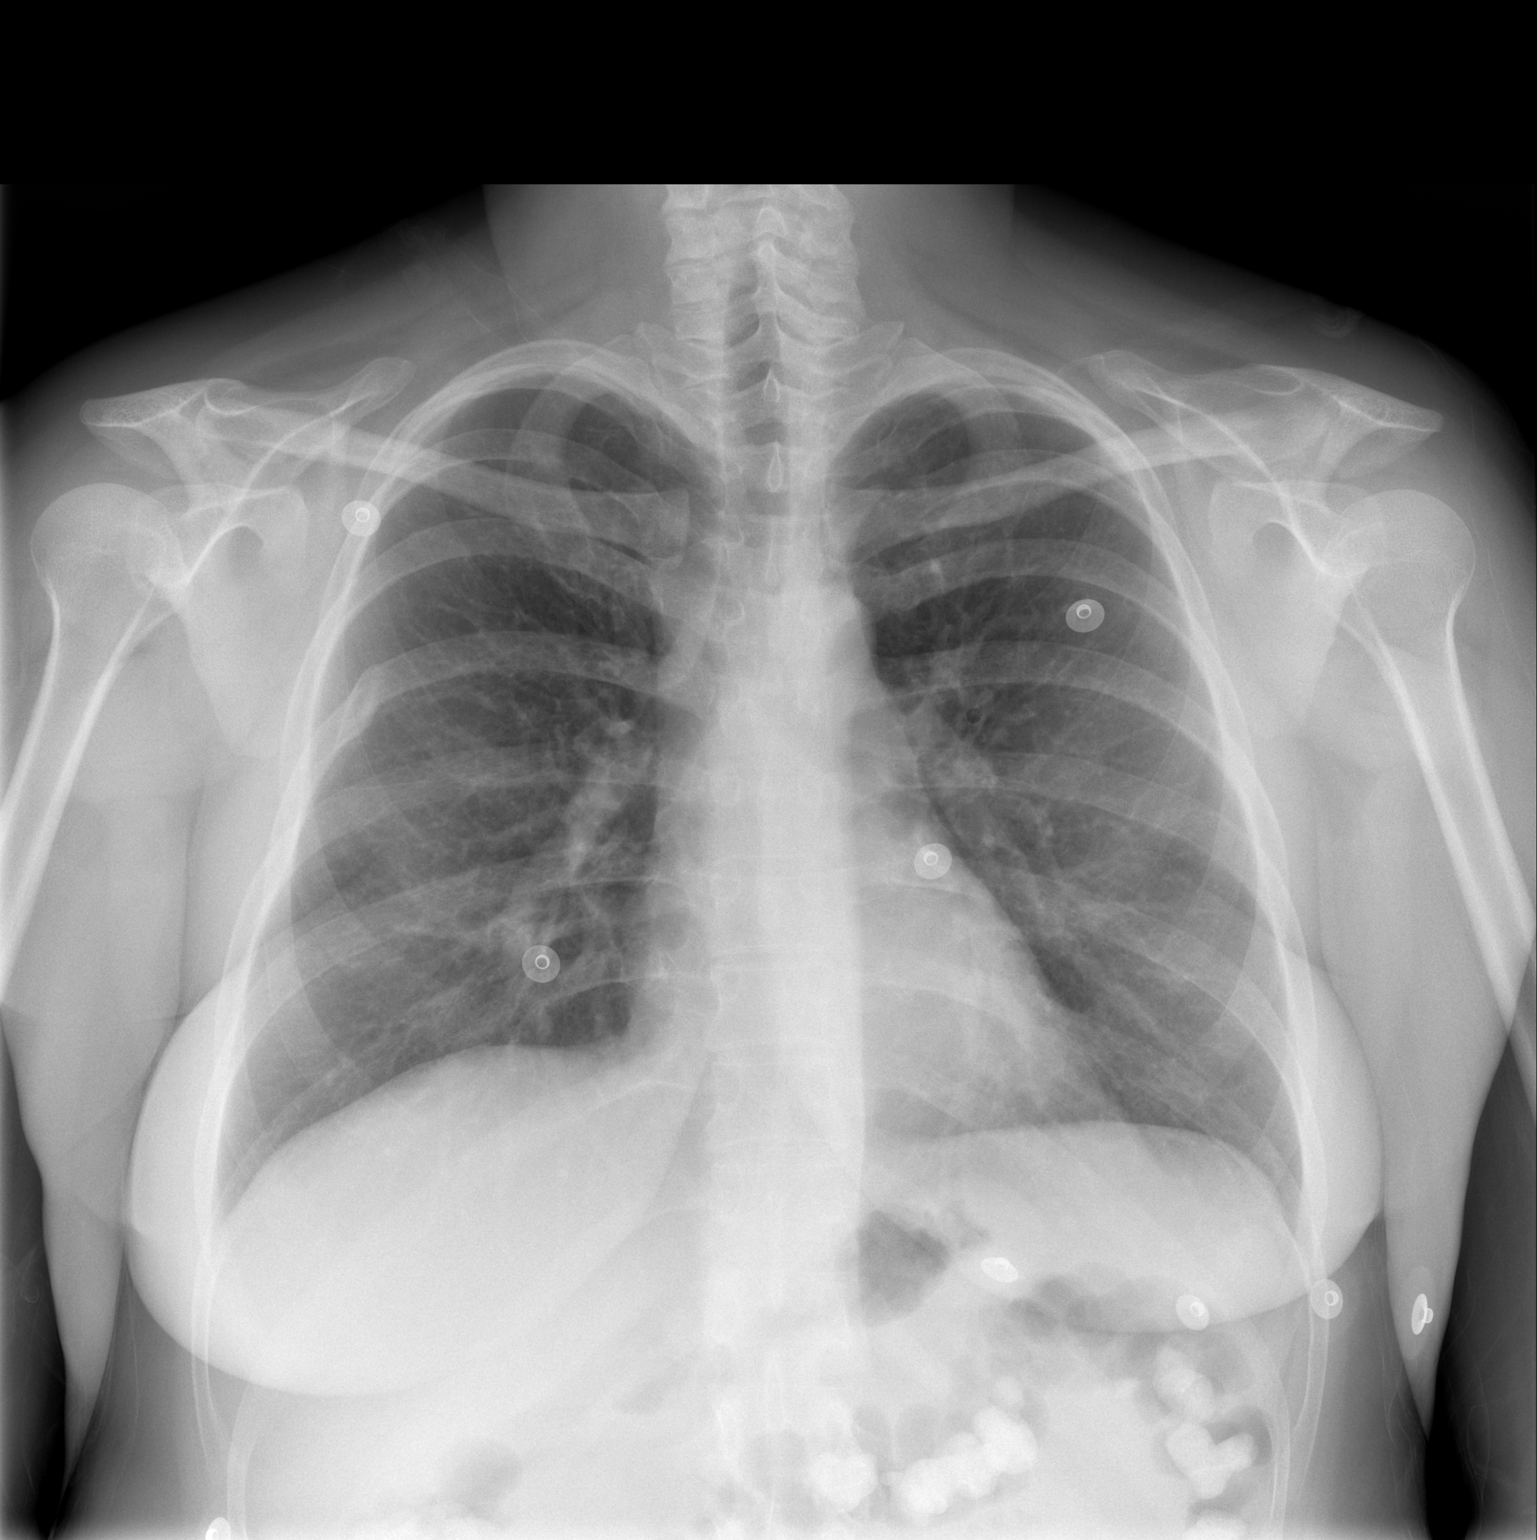

[w chest lat]
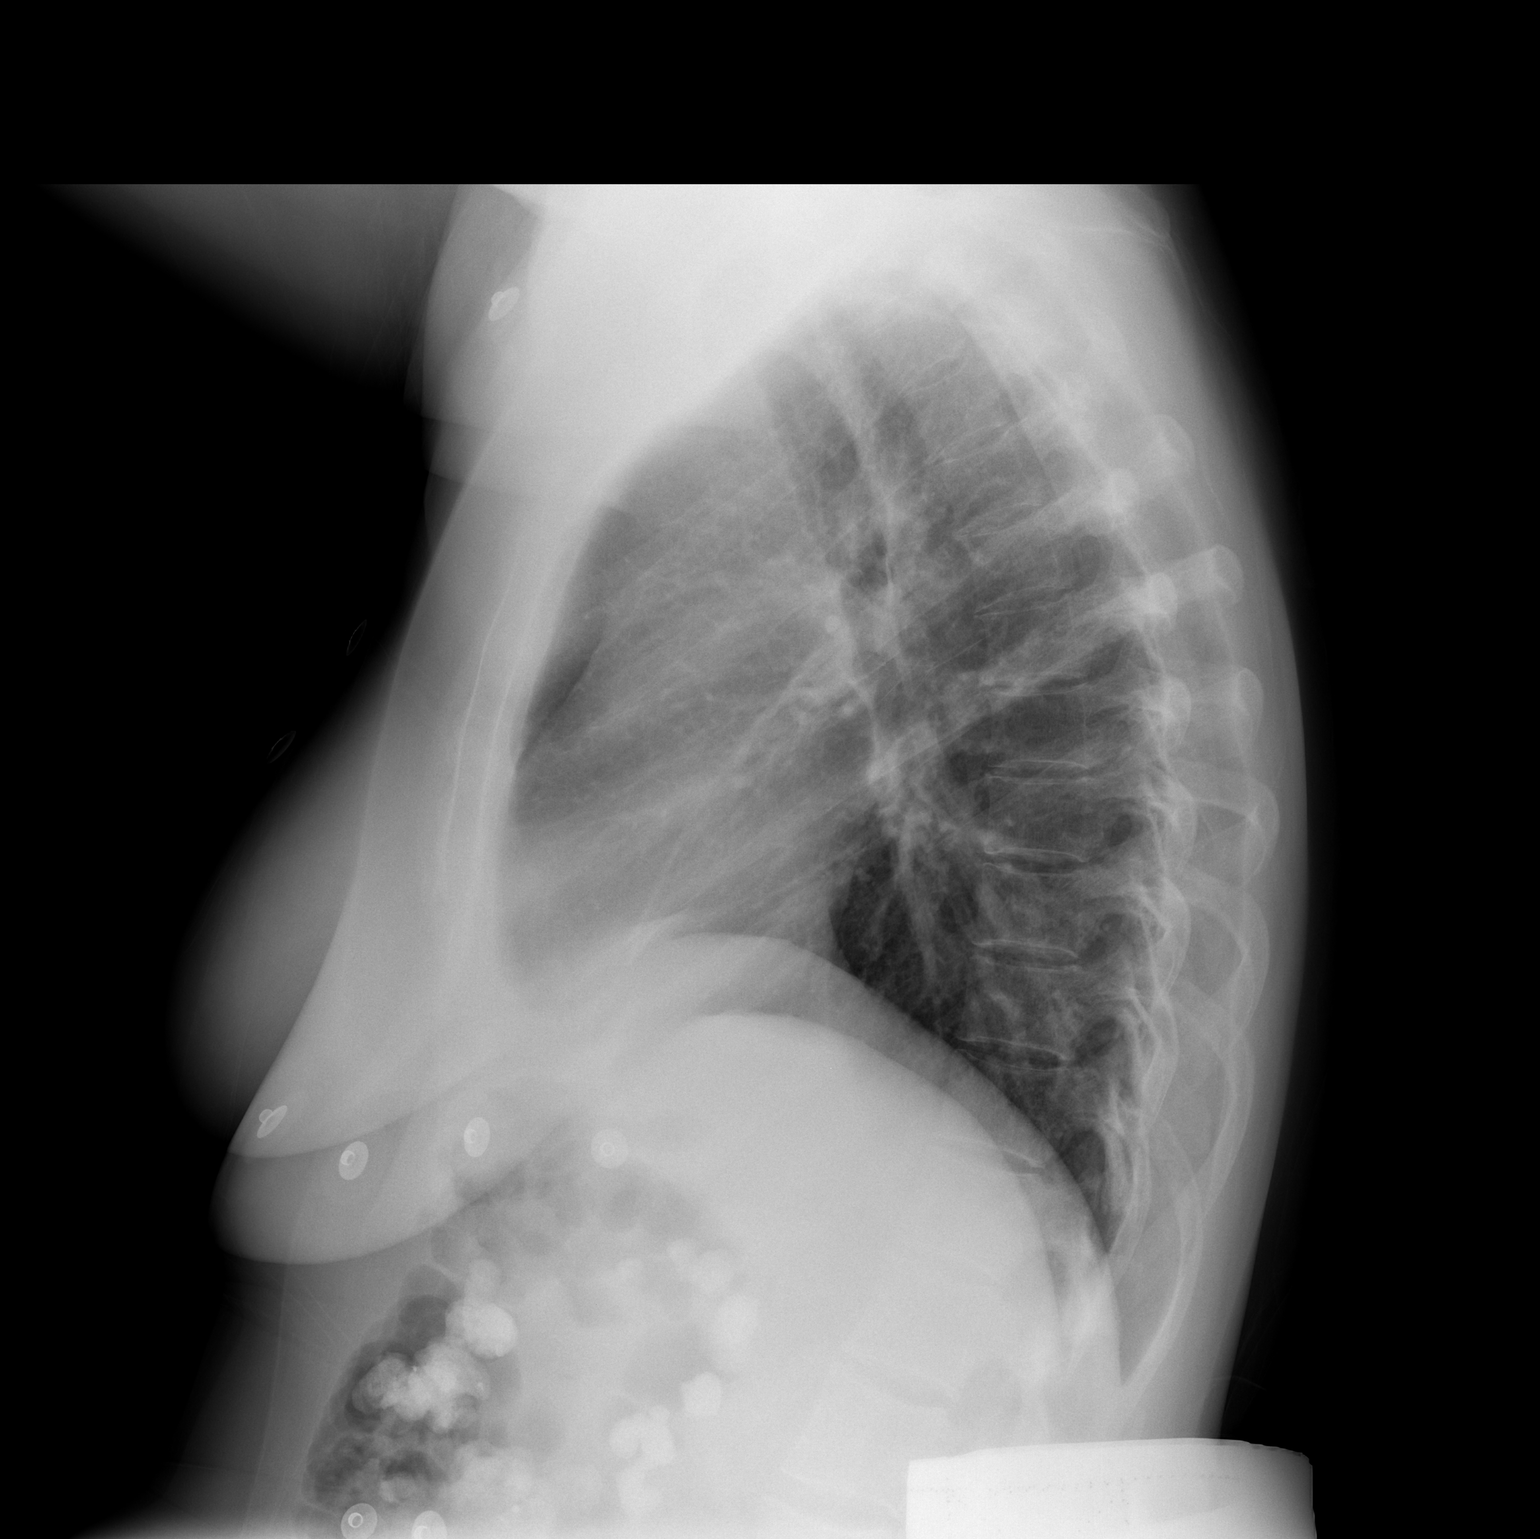

[2 of 2 positions shown; findings below may reference images not displayed]

FINDINGS: Heart and mediastinal contours are within normal limits.
No focal opacities or effusions.  Visualized skeleton unremarkable.
Old right rib fracture again noted, unchanged.
IMPRESSION: No acute cardiopulmonary process.

## 2009-04-16 ENCOUNTER — Ambulatory Visit: Payer: Self-pay | Admitting: Family Medicine

## 2009-04-17 ENCOUNTER — Emergency Department (HOSPITAL_COMMUNITY): Admission: EM | Admit: 2009-04-17 | Discharge: 2009-04-18 | Payer: Self-pay | Admitting: Emergency Medicine

## 2009-04-26 ENCOUNTER — Ambulatory Visit: Payer: Self-pay | Admitting: Diagnostic Radiology

## 2009-04-26 ENCOUNTER — Telehealth: Payer: Self-pay | Admitting: Internal Medicine

## 2009-04-26 ENCOUNTER — Emergency Department (HOSPITAL_BASED_OUTPATIENT_CLINIC_OR_DEPARTMENT_OTHER): Admission: EM | Admit: 2009-04-26 | Discharge: 2009-04-26 | Payer: Self-pay | Admitting: Emergency Medicine

## 2009-04-28 IMAGING — CR DG NECK SOFT TISSUE
3 series · 3 of 3 positions shown · non-contrast
Comparison: No priors

CLINICAL DATA: Choking/food sticks in throat

NECK SOFT TISSUES - 3+ VIEW

[w soft tissue neck (1 of 2)]
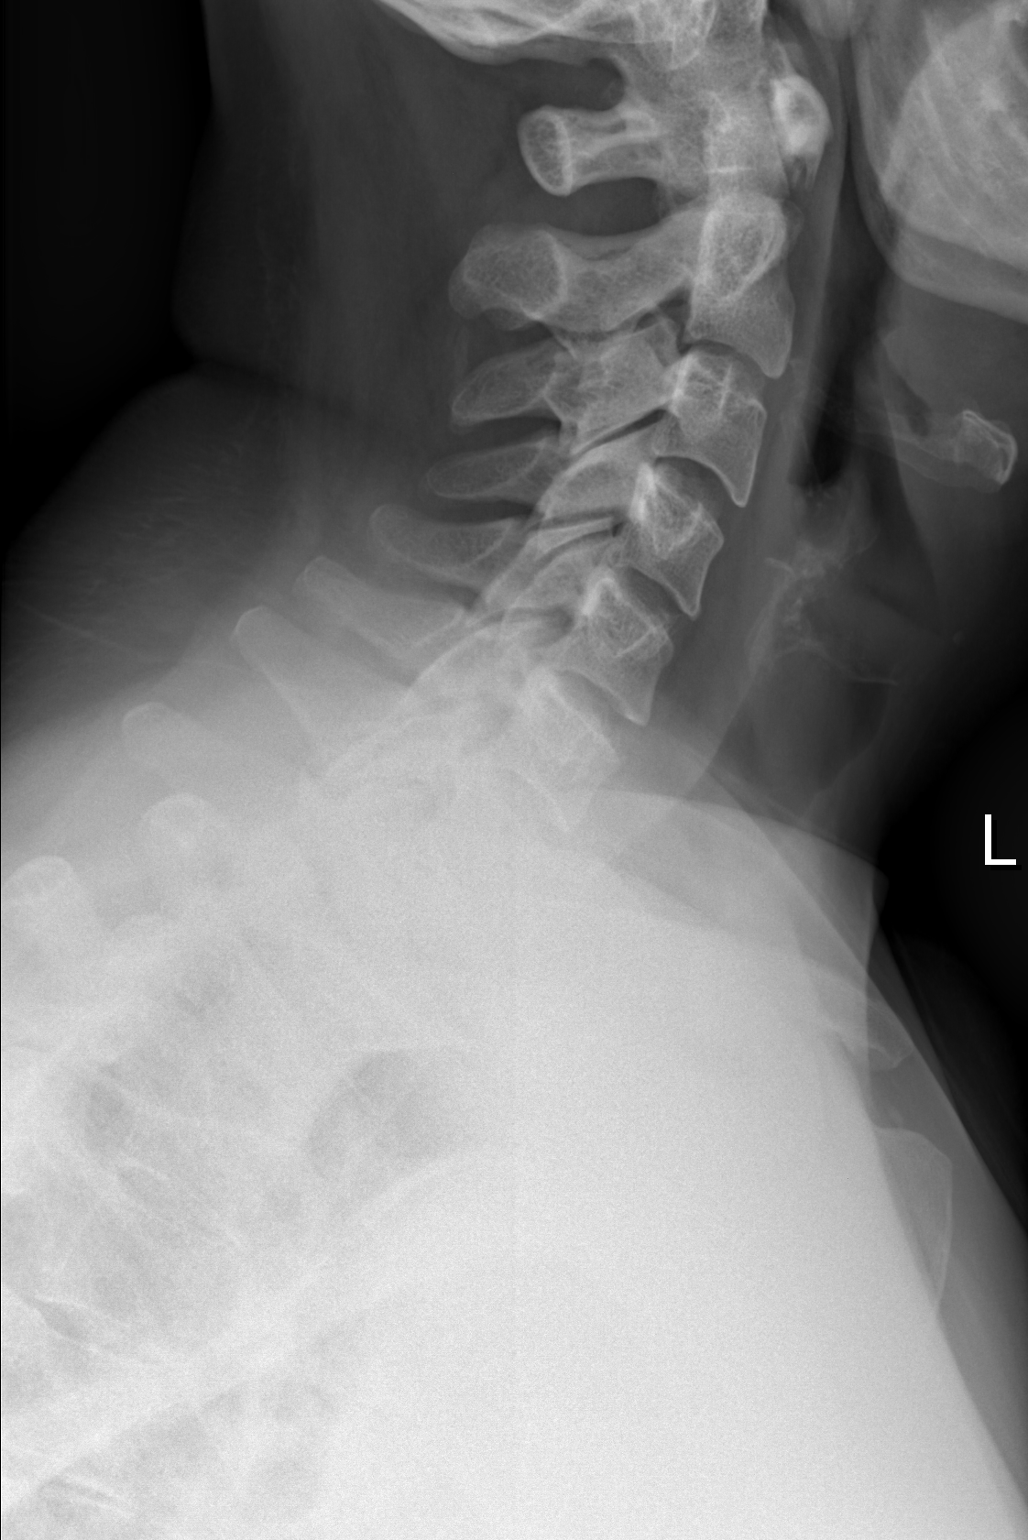

[w soft tissue neck (2 of 2)]
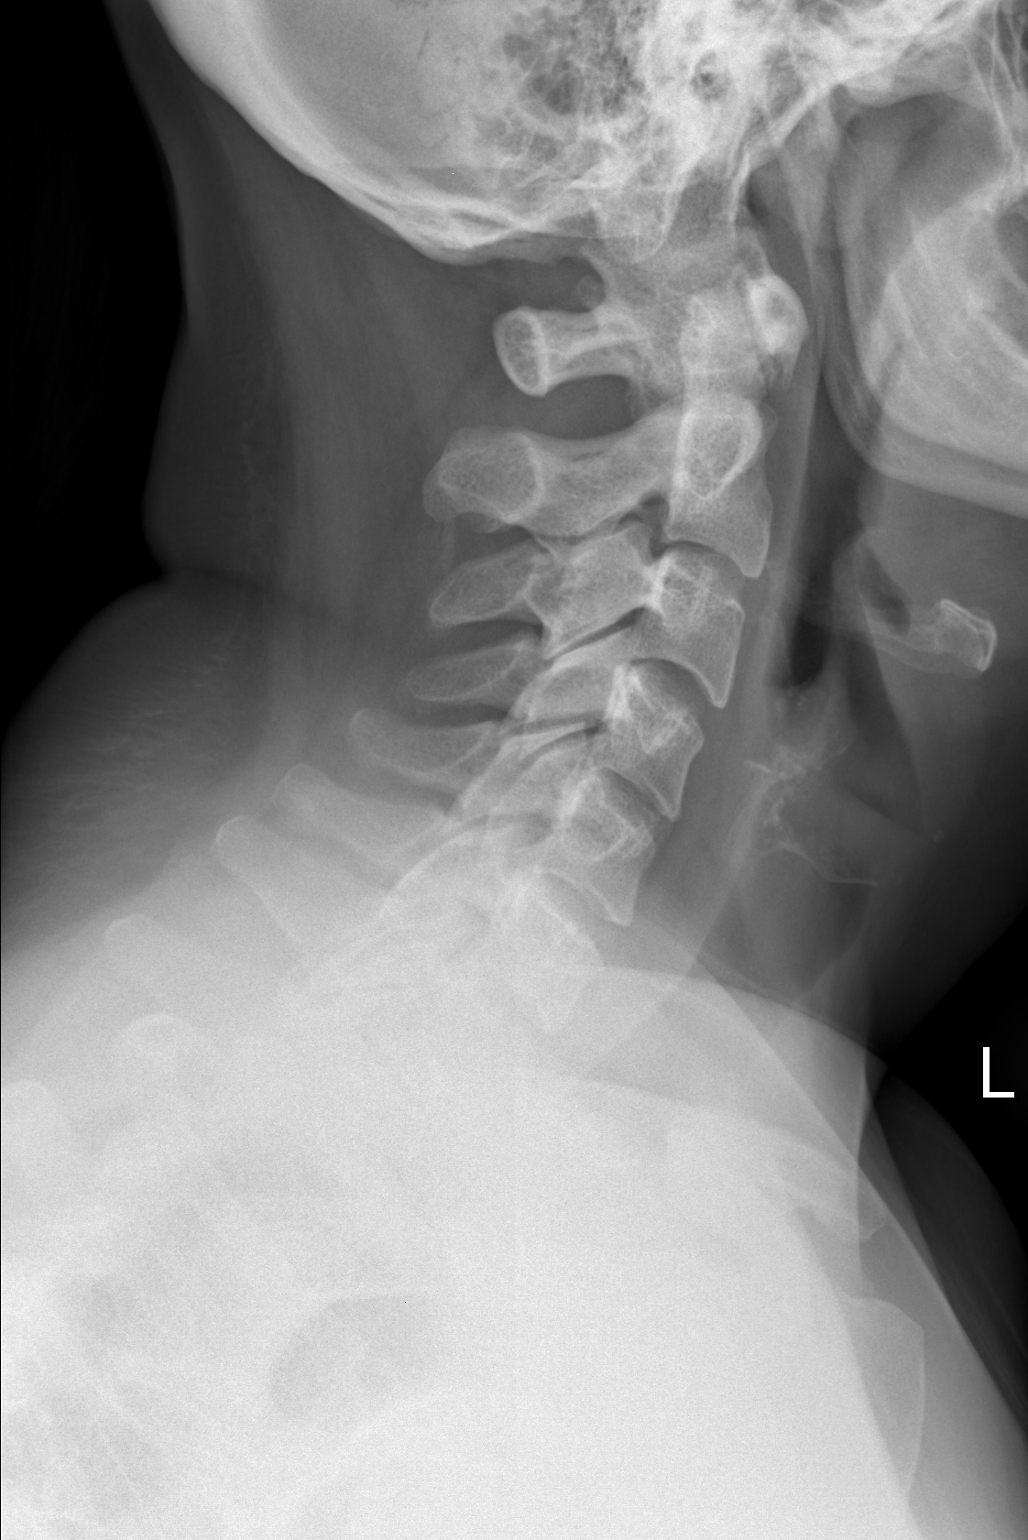

[w soft tissue neck ap]
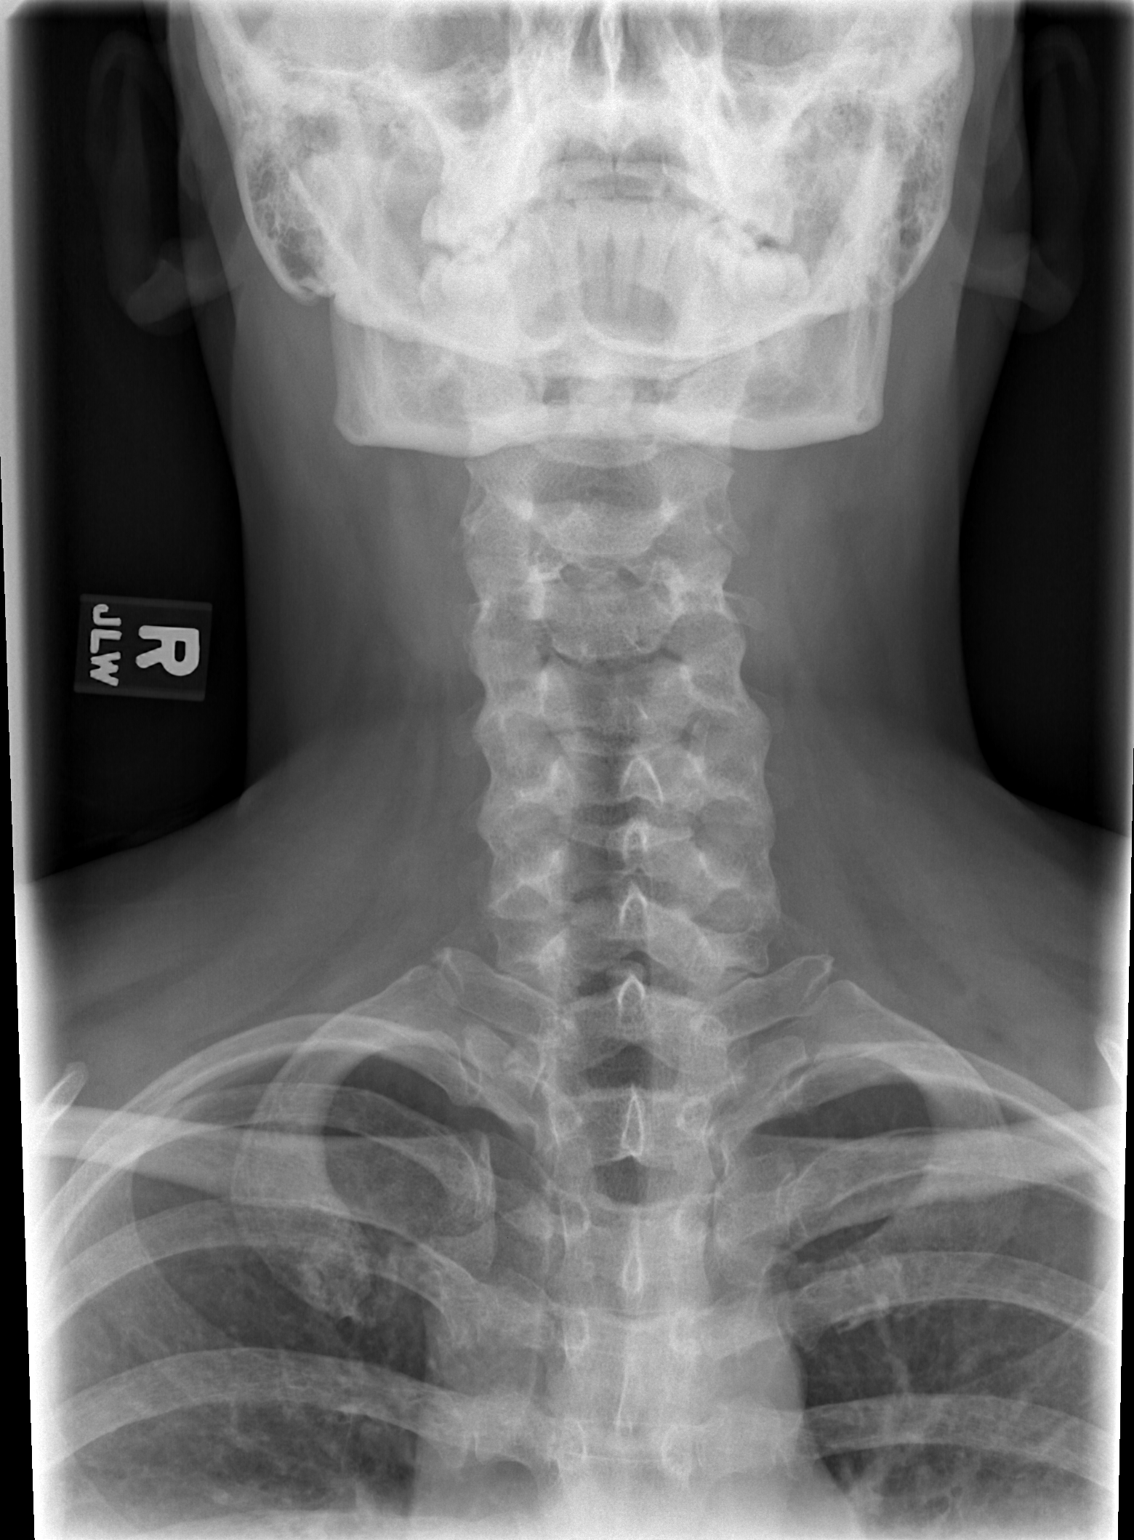

[3 of 3 positions shown; findings below may reference images not displayed]

FINDINGS: The airway is normal.  The prevertebral soft tissues are
unremarkable.  Bony structures intact.
IMPRESSION: No pathological findings.

## 2009-05-09 IMAGING — CR DG CHEST 2V
2 series · 2 of 2 positions shown · non-contrast
Comparison: Chest x-ray 01/19/2008 film

CLINICAL DATA: Chest pain

CHEST - 2 VIEW

[w chest pa]
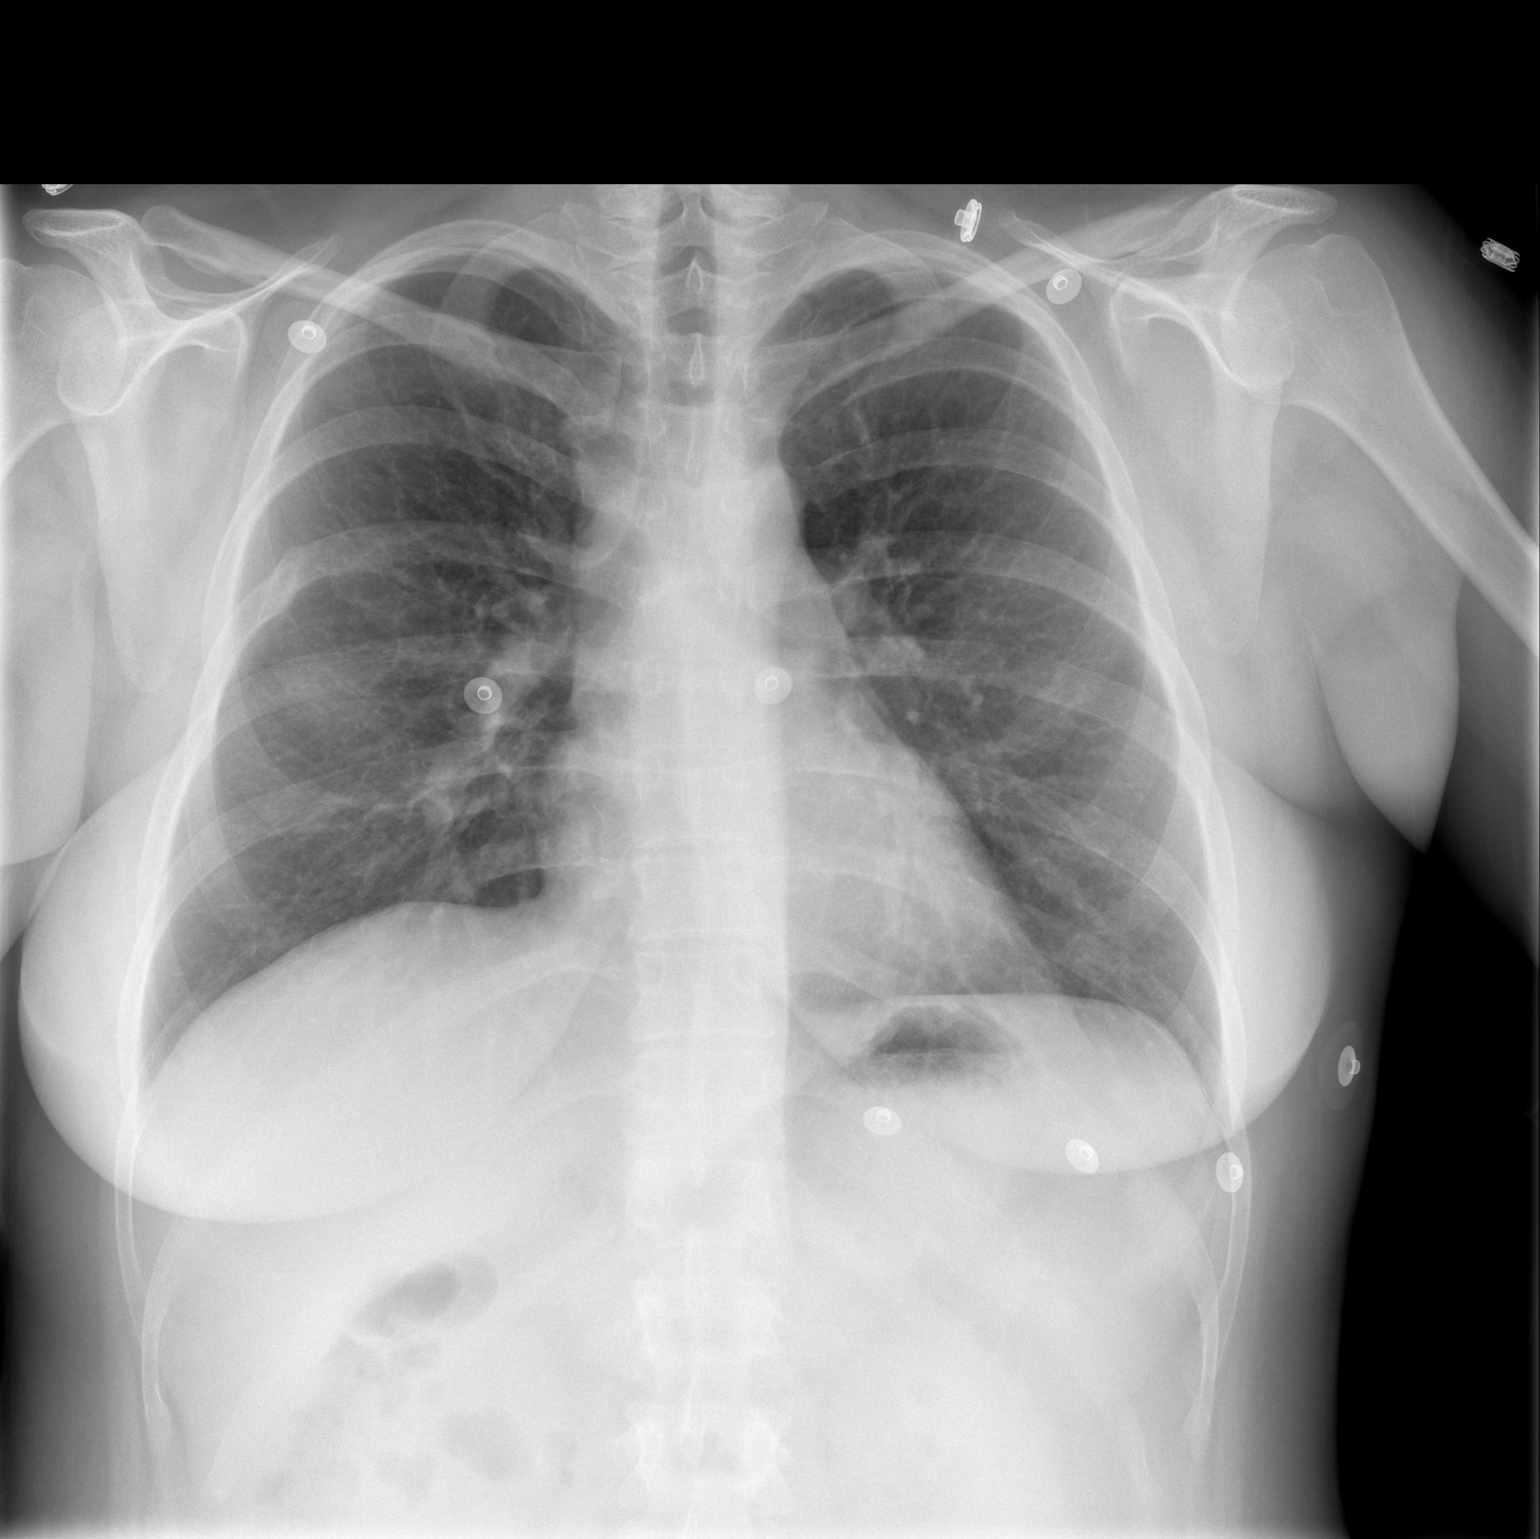

[w chest lat]
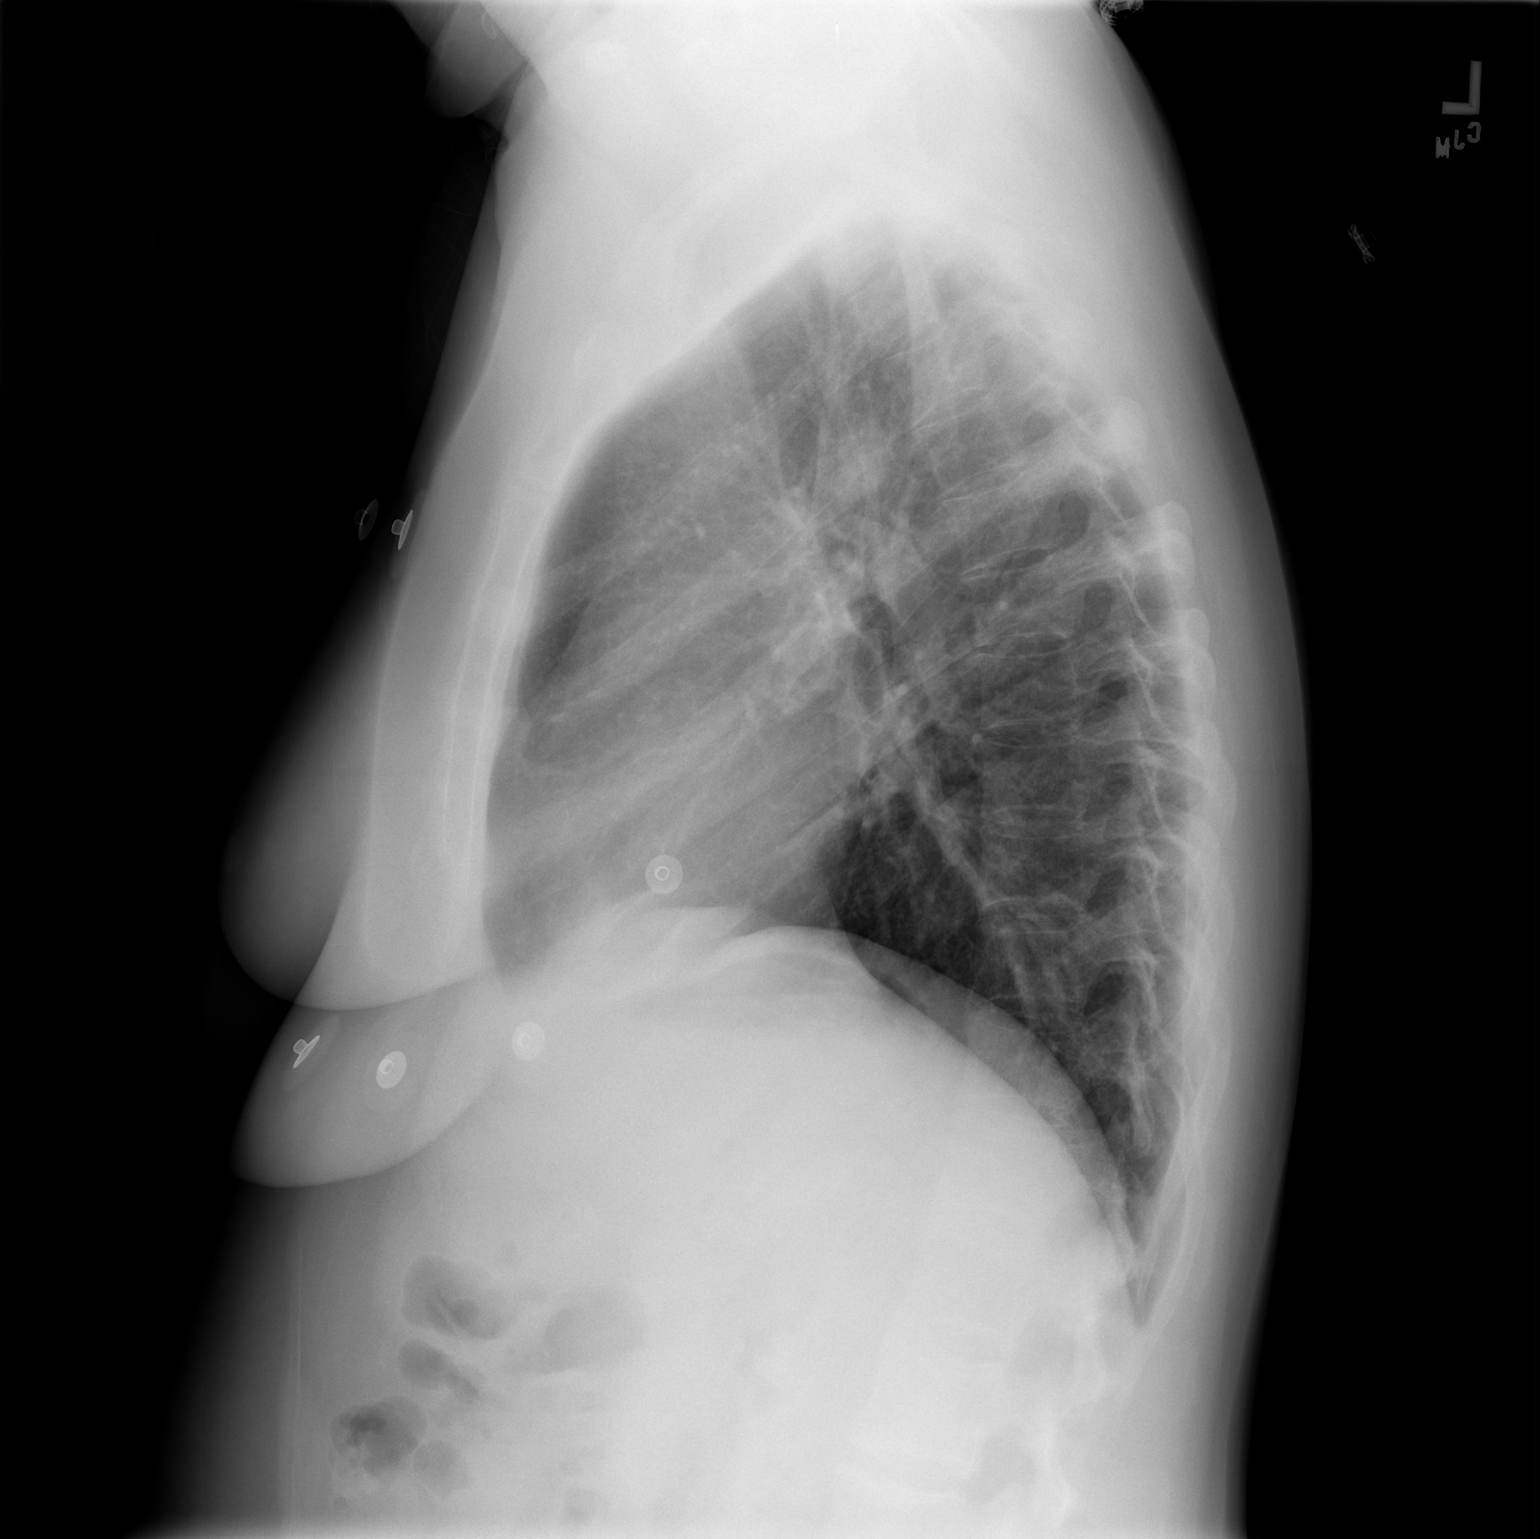

[2 of 2 positions shown; findings below may reference images not displayed]

FINDINGS: The heart and mediastinal contours are normal.  Both
lungs are clear. Negative for pneumothorax or pleural effusion.
The upper abdomen is unremarkable There is a remote healed right
sixth rib fracture.  No acute osseous abnormality is identified.
IMPRESSION: No evidence of acute cardiopulmonary disease

## 2009-05-11 ENCOUNTER — Telehealth: Payer: Self-pay | Admitting: Family Medicine

## 2009-05-12 ENCOUNTER — Ambulatory Visit: Payer: Self-pay | Admitting: Diagnostic Radiology

## 2009-05-12 ENCOUNTER — Ambulatory Visit: Payer: Self-pay | Admitting: Family Medicine

## 2009-05-12 ENCOUNTER — Emergency Department (HOSPITAL_BASED_OUTPATIENT_CLINIC_OR_DEPARTMENT_OTHER): Admission: EM | Admit: 2009-05-12 | Discharge: 2009-05-12 | Payer: Self-pay | Admitting: Emergency Medicine

## 2009-05-12 IMAGING — CR DG HAND COMPLETE 3+V*R*
3 series · 3 of 3 positions shown · non-contrast
Comparison: None

CLINICAL DATA: 42-year-old female.  Pain at 4th and fifth  tarsal
heads.

RIGHT HAND - COMPLETE 3+ VIEW

[x hand pa right]
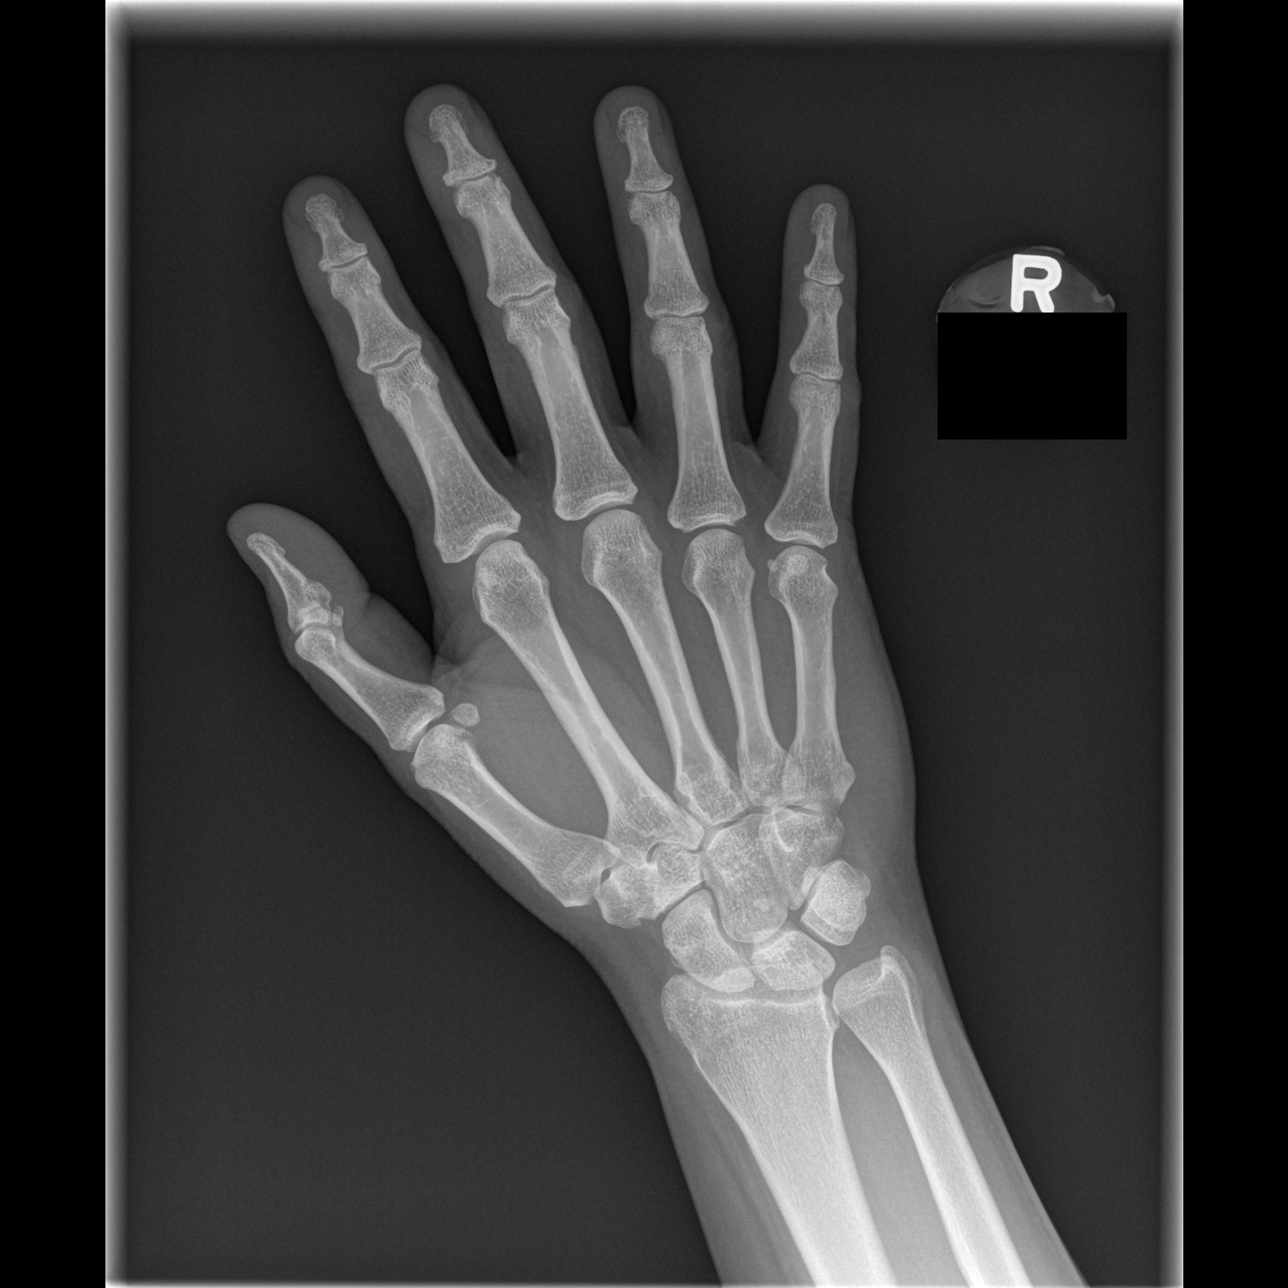

[x hand oblique right]
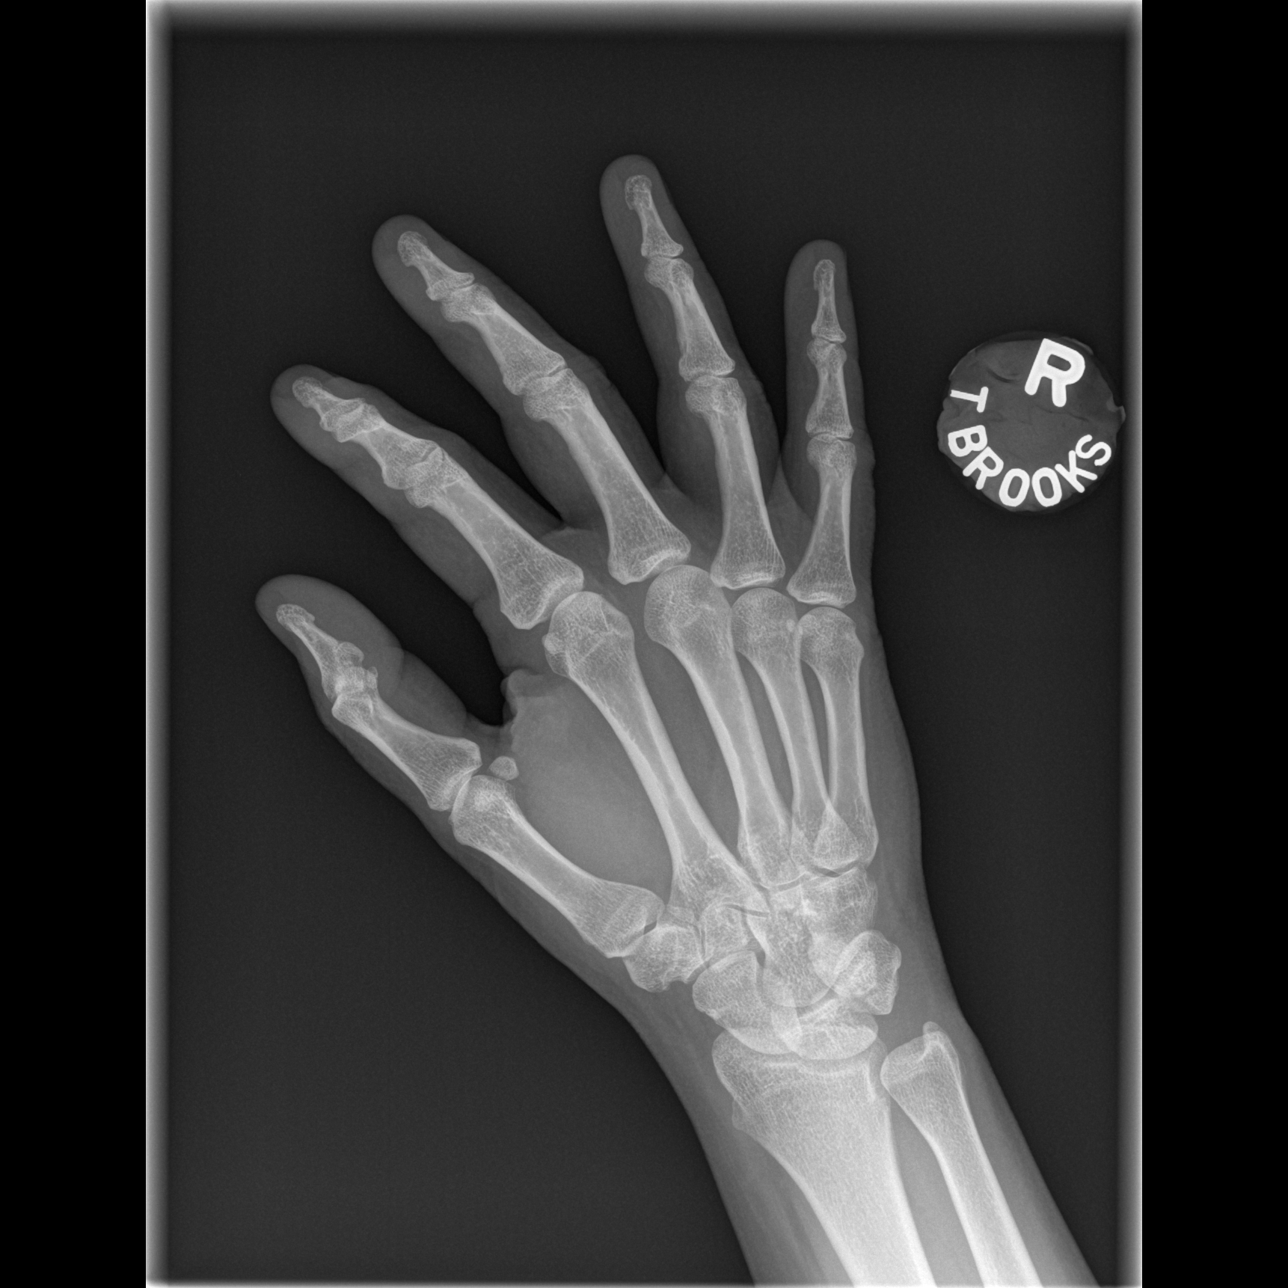

[x hand lat right]
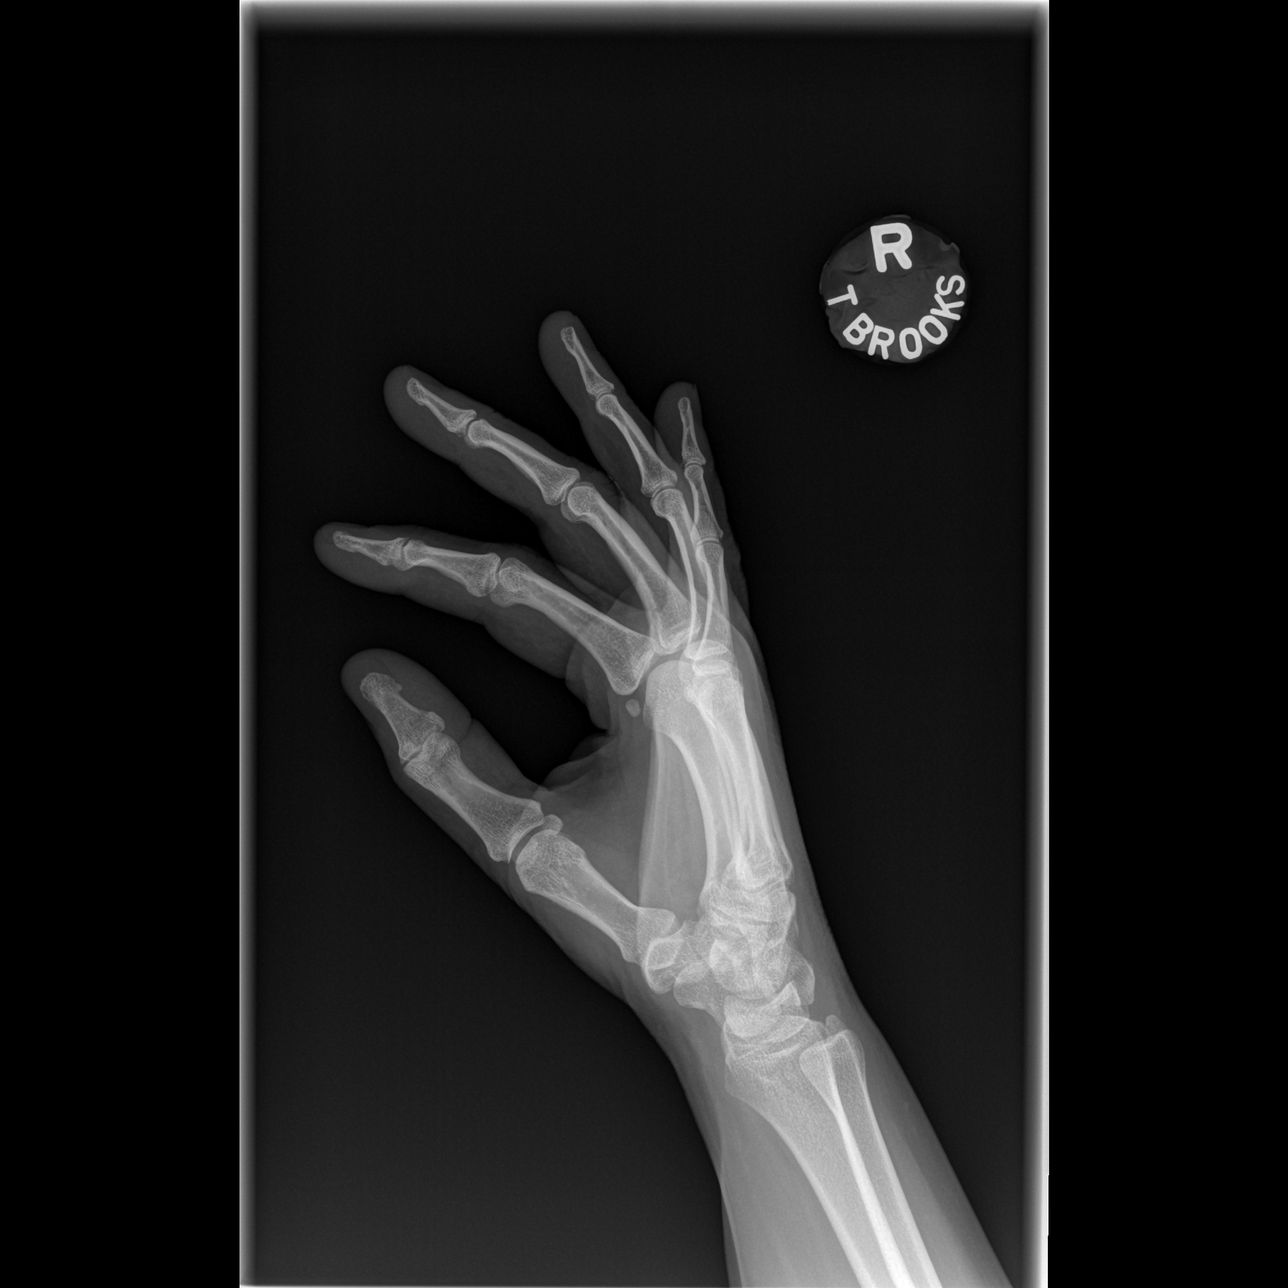

[3 of 3 positions shown; findings below may reference images not displayed]

FINDINGS: No evidence of fracture or dislocation. No evidence of
radiopaque foreign body.  No subcutaneous gas.
IMPRESSION: 1..  No osseous abnormality.
2..  No soft tissue abnormality.

## 2009-05-22 ENCOUNTER — Ambulatory Visit: Payer: Self-pay | Admitting: Internal Medicine

## 2009-05-22 ENCOUNTER — Encounter: Payer: Self-pay | Admitting: Internal Medicine

## 2009-05-25 ENCOUNTER — Ambulatory Visit: Payer: Self-pay | Admitting: Family Medicine

## 2009-05-27 ENCOUNTER — Encounter (INDEPENDENT_AMBULATORY_CARE_PROVIDER_SITE_OTHER): Payer: Self-pay | Admitting: *Deleted

## 2009-05-31 ENCOUNTER — Telehealth: Payer: Self-pay | Admitting: Family Medicine

## 2009-06-01 ENCOUNTER — Telehealth (INDEPENDENT_AMBULATORY_CARE_PROVIDER_SITE_OTHER): Payer: Self-pay | Admitting: *Deleted

## 2009-06-01 ENCOUNTER — Ambulatory Visit: Payer: Self-pay | Admitting: Diagnostic Radiology

## 2009-06-01 ENCOUNTER — Telehealth: Payer: Self-pay | Admitting: Gastroenterology

## 2009-06-01 ENCOUNTER — Emergency Department (HOSPITAL_BASED_OUTPATIENT_CLINIC_OR_DEPARTMENT_OTHER): Admission: EM | Admit: 2009-06-01 | Discharge: 2009-06-01 | Payer: Self-pay | Admitting: Emergency Medicine

## 2009-06-02 ENCOUNTER — Ambulatory Visit: Payer: Self-pay | Admitting: Critical Care Medicine

## 2009-06-07 ENCOUNTER — Ambulatory Visit: Payer: Self-pay | Admitting: Family Medicine

## 2009-06-08 ENCOUNTER — Encounter: Payer: Self-pay | Admitting: Gastroenterology

## 2009-06-09 ENCOUNTER — Emergency Department (HOSPITAL_COMMUNITY): Admission: EM | Admit: 2009-06-09 | Discharge: 2009-06-10 | Payer: Self-pay | Admitting: Emergency Medicine

## 2009-06-10 ENCOUNTER — Encounter: Payer: Self-pay | Admitting: Family Medicine

## 2009-06-10 LAB — CONVERTED CEMR LAB
ALT: 16 units/L
AST: 19 units/L
Albumin: 3.5 g/dL
Alkaline Phosphatase: 42 units/L
Total Bilirubin: 0.4 mg/dL
Total Protein: 6.5 g/dL

## 2009-06-16 ENCOUNTER — Encounter: Payer: Self-pay | Admitting: Gastroenterology

## 2009-06-16 ENCOUNTER — Telehealth (INDEPENDENT_AMBULATORY_CARE_PROVIDER_SITE_OTHER): Payer: Self-pay | Admitting: *Deleted

## 2009-06-16 DIAGNOSIS — K220 Achalasia of cardia: Secondary | ICD-10-CM | POA: Insufficient documentation

## 2009-06-21 ENCOUNTER — Emergency Department (HOSPITAL_BASED_OUTPATIENT_CLINIC_OR_DEPARTMENT_OTHER): Admission: EM | Admit: 2009-06-21 | Discharge: 2009-06-21 | Payer: Self-pay | Admitting: Emergency Medicine

## 2009-06-21 ENCOUNTER — Encounter: Payer: Self-pay | Admitting: Family Medicine

## 2009-06-21 ENCOUNTER — Ambulatory Visit: Payer: Self-pay | Admitting: Diagnostic Radiology

## 2009-06-24 ENCOUNTER — Ambulatory Visit: Payer: Self-pay | Admitting: Internal Medicine

## 2009-06-24 DIAGNOSIS — G901 Familial dysautonomia [Riley-Day]: Secondary | ICD-10-CM | POA: Insufficient documentation

## 2009-06-24 DIAGNOSIS — G909 Disorder of the autonomic nervous system, unspecified: Secondary | ICD-10-CM | POA: Insufficient documentation

## 2009-06-28 ENCOUNTER — Telehealth (INDEPENDENT_AMBULATORY_CARE_PROVIDER_SITE_OTHER): Payer: Self-pay | Admitting: *Deleted

## 2009-06-29 ENCOUNTER — Telehealth: Payer: Self-pay | Admitting: Gastroenterology

## 2009-07-01 ENCOUNTER — Encounter: Payer: Self-pay | Admitting: Family Medicine

## 2009-07-01 ENCOUNTER — Ambulatory Visit: Payer: Self-pay | Admitting: Family Medicine

## 2009-07-01 ENCOUNTER — Encounter: Admission: RE | Admit: 2009-07-01 | Discharge: 2009-07-01 | Payer: Self-pay | Admitting: Sports Medicine

## 2009-07-01 DIAGNOSIS — R002 Palpitations: Secondary | ICD-10-CM | POA: Insufficient documentation

## 2009-07-05 IMAGING — CR DG CHEST 2V
2 series · 2 of 2 positions shown · non-contrast
Comparison: 02/14/2008

CLINICAL DATA: Hypertension, chest pain, cough, asthma, shortness
of breath

CHEST - 2 VIEW

[w chest pa]
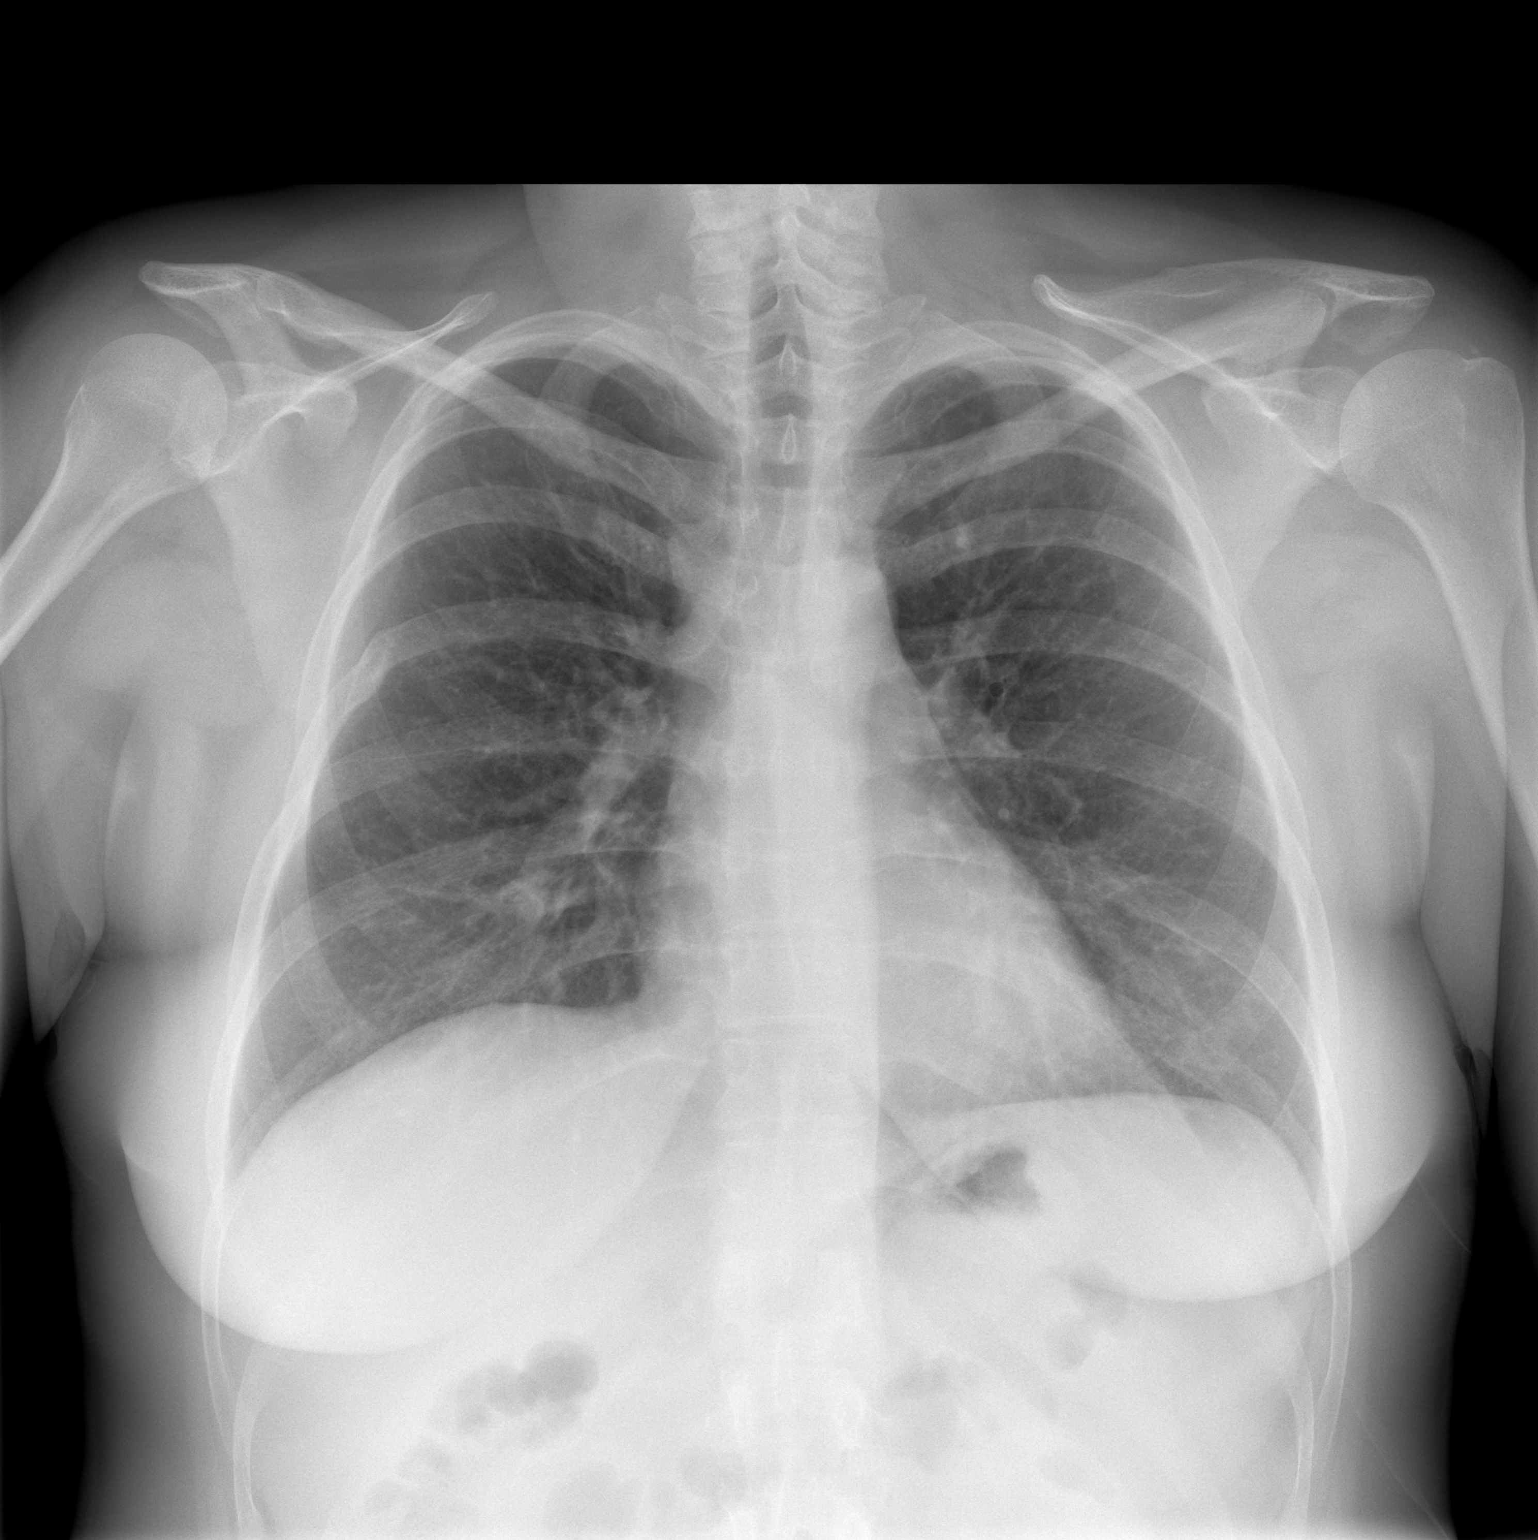

[w chest lat]
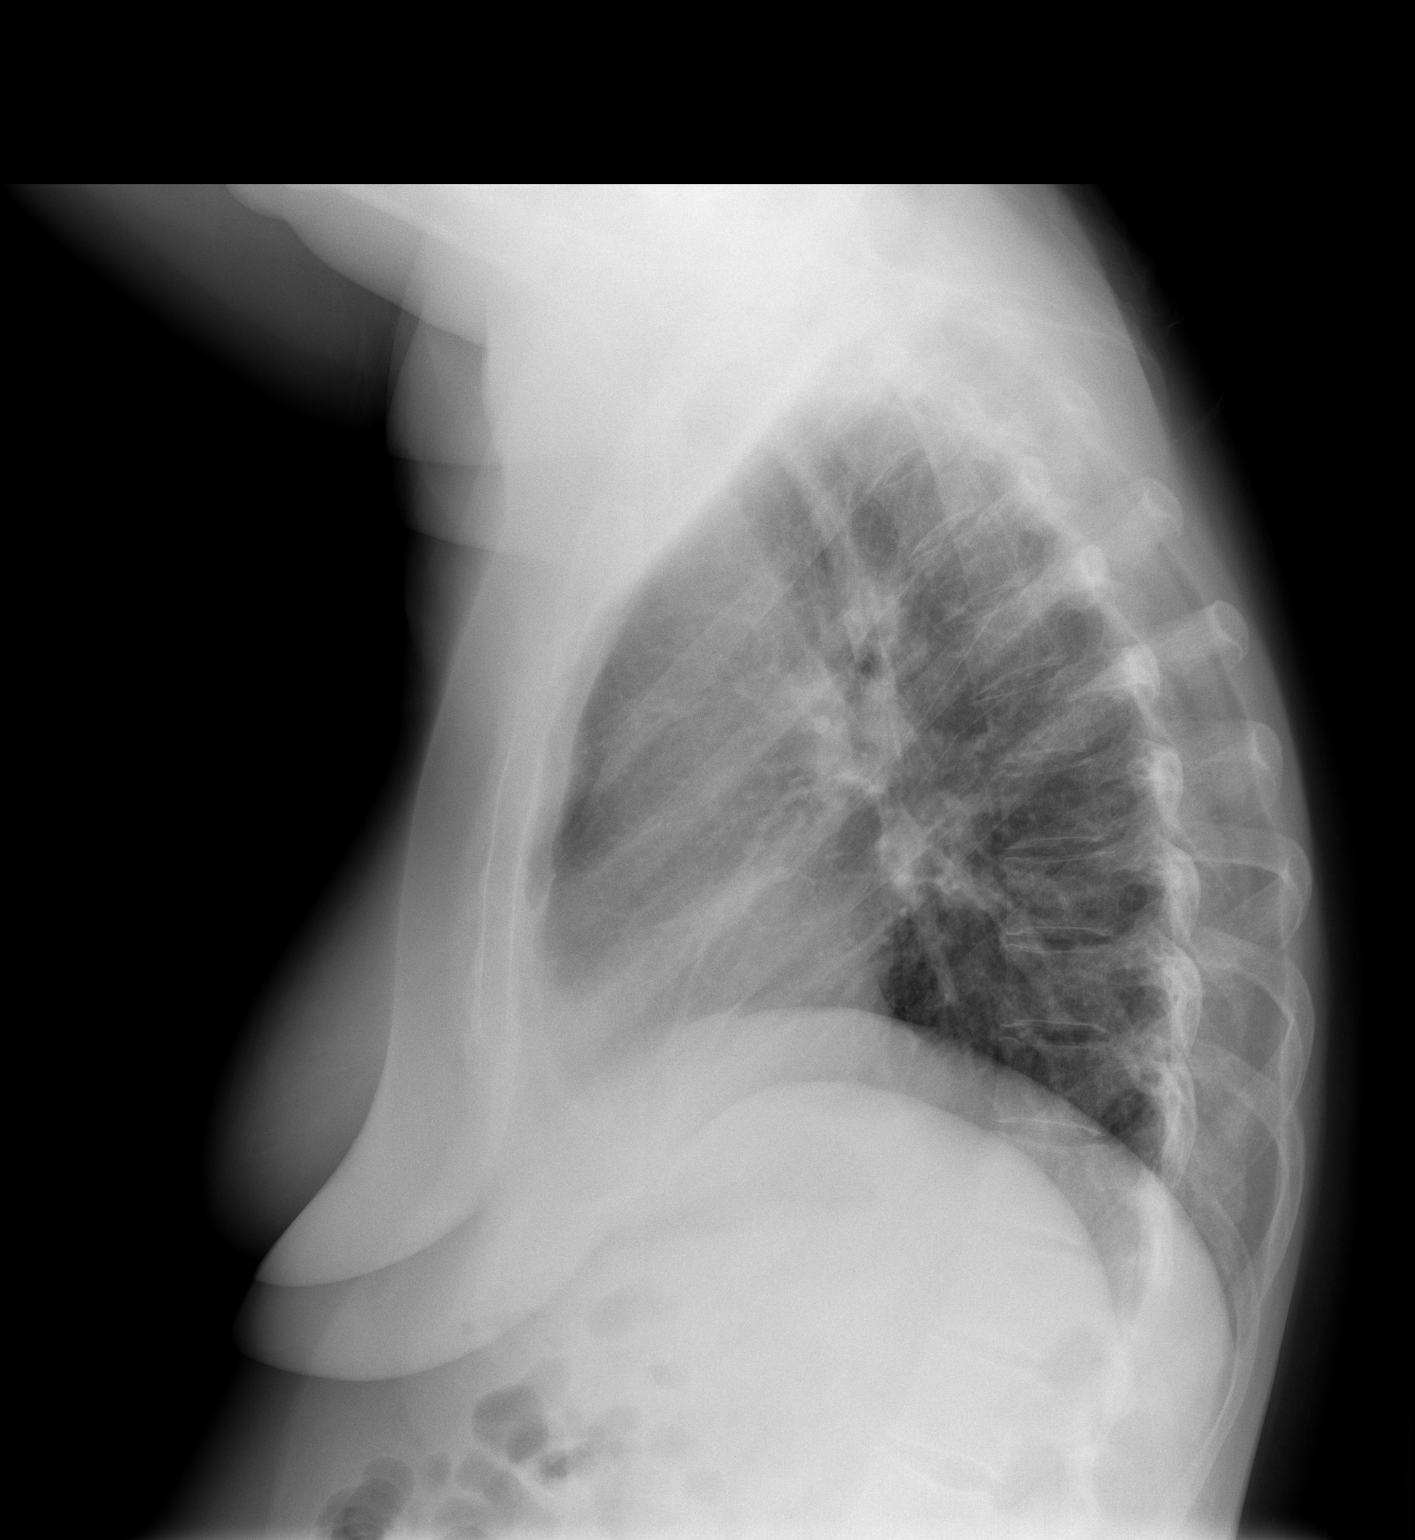

[2 of 2 positions shown; findings below may reference images not displayed]

FINDINGS: Normal heart size, mediastinal contours, and pulmonary vascularity.
Mild chronic bronchitic changes.
Lungs otherwise clear.
No pleural effusion effusion or pneumothorax.
No acute bony abnormalities.
Old fracture lateral right sixth rib.
IMPRESSION: Minimal chronic bronchitic changes.
No acute abnormalities.

## 2009-07-12 ENCOUNTER — Encounter: Payer: Self-pay | Admitting: Family Medicine

## 2009-07-13 ENCOUNTER — Emergency Department (HOSPITAL_COMMUNITY): Admission: EM | Admit: 2009-07-13 | Discharge: 2009-07-13 | Payer: Self-pay | Admitting: Emergency Medicine

## 2009-07-13 IMAGING — CR DG CHEST 2V
2 series · 2 of 2 positions shown · non-contrast
Comparison: 04/11/2008

CLINICAL DATA: Chest pain

CHEST - 2 VIEW

[w chest pa]
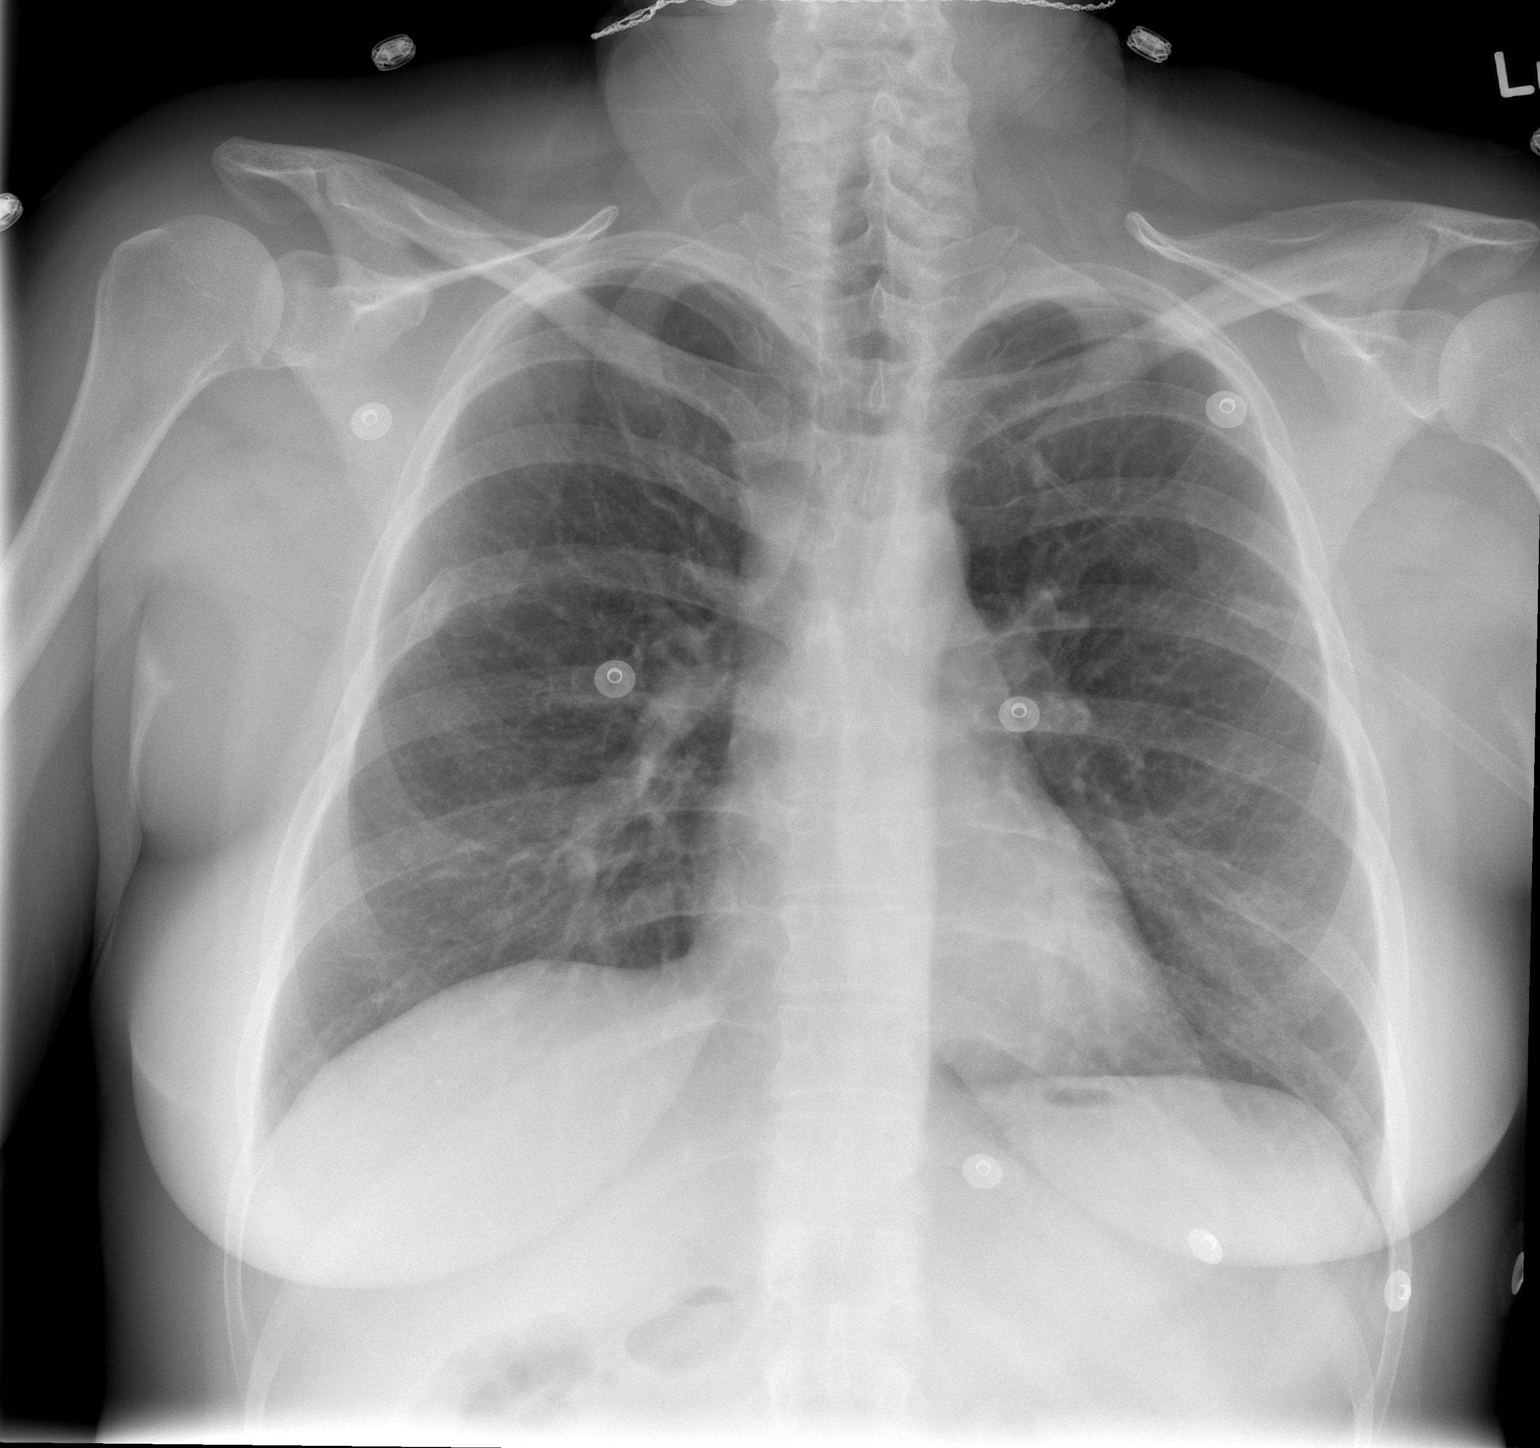

[w chest lat]
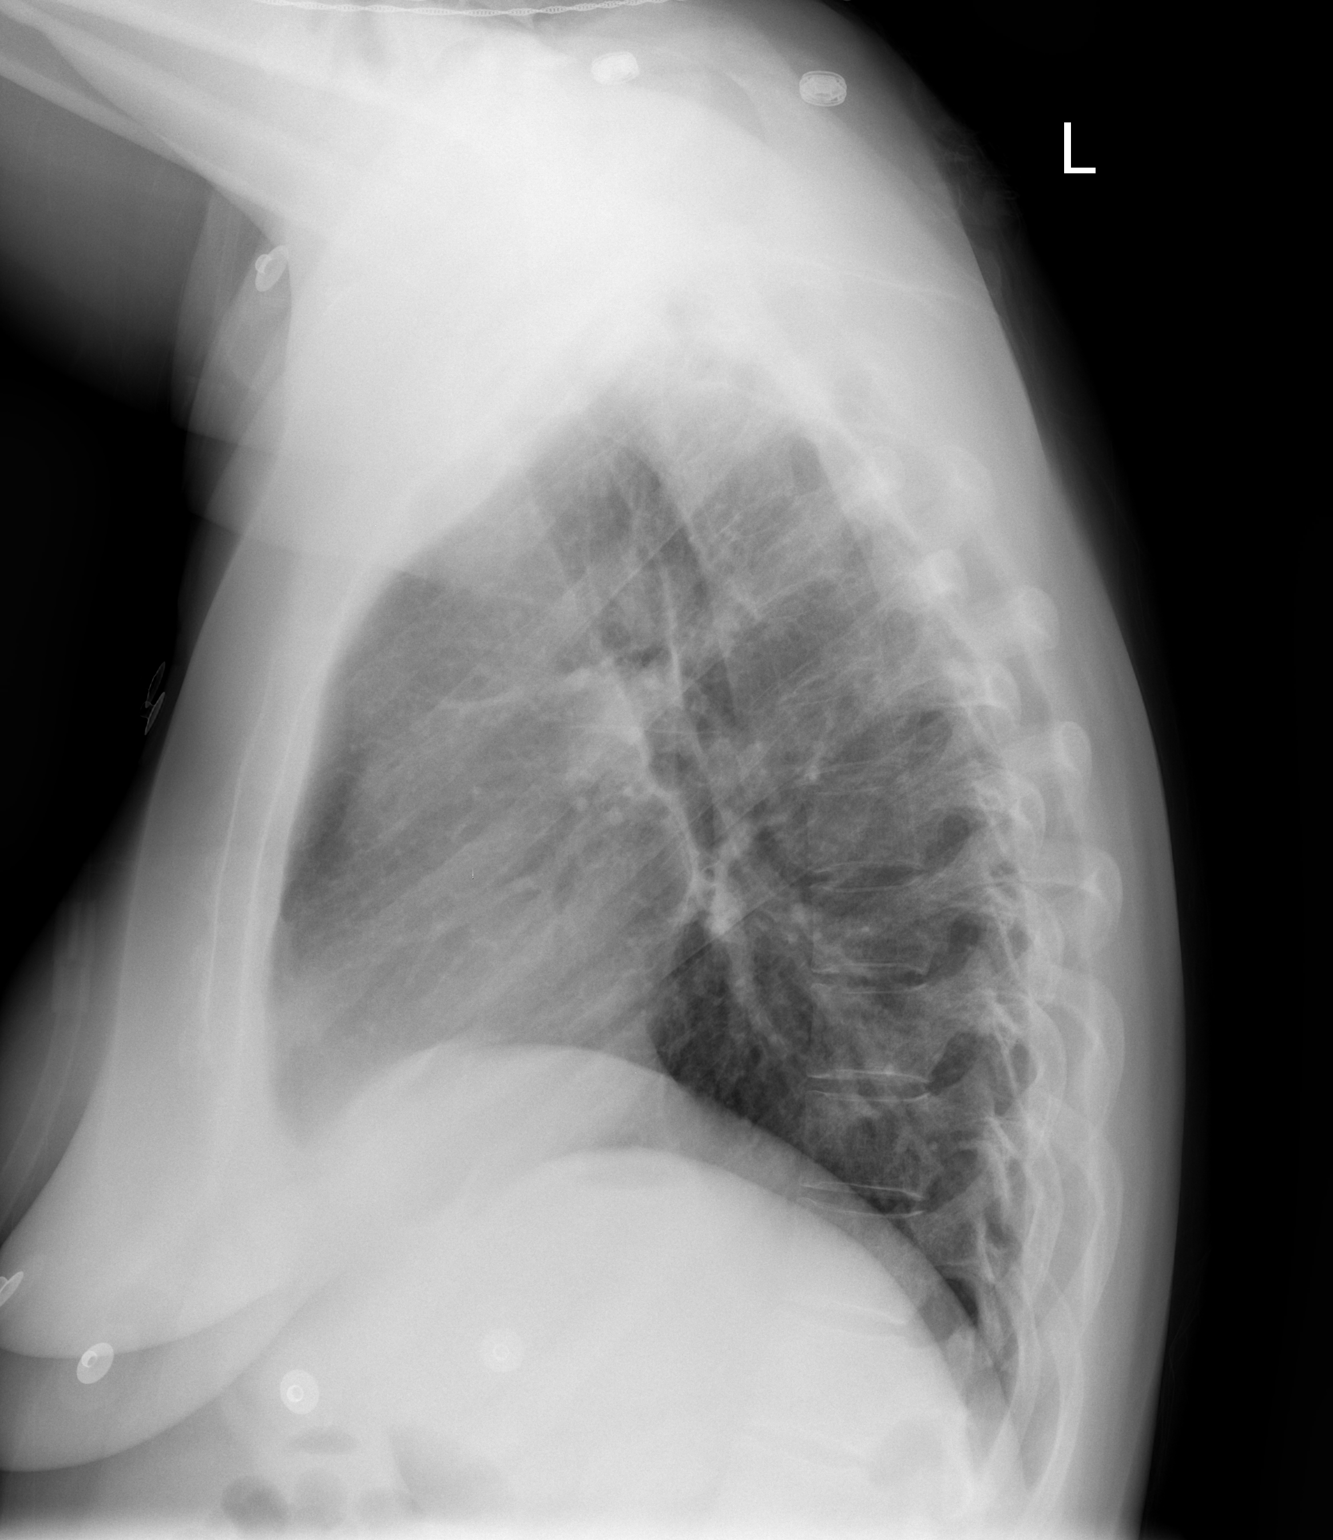

[2 of 2 positions shown; findings below may reference images not displayed]

FINDINGS: The cardiac silhouette, mediastinal and hilar contours
are within normal limits and stable.  The lungs are clear.  There
is a remote healed sixth posterior rib fracture.
IMPRESSION: 1.  No acute cardiopulmonary findings.  Stable appearance of the
chest since prior film of 04/11/2008.

## 2009-07-14 ENCOUNTER — Ambulatory Visit: Payer: Self-pay | Admitting: Family Medicine

## 2009-07-14 ENCOUNTER — Telehealth: Payer: Self-pay | Admitting: Internal Medicine

## 2009-07-14 DIAGNOSIS — M25559 Pain in unspecified hip: Secondary | ICD-10-CM | POA: Insufficient documentation

## 2009-07-14 DIAGNOSIS — B351 Tinea unguium: Secondary | ICD-10-CM | POA: Insufficient documentation

## 2009-07-15 ENCOUNTER — Telehealth: Payer: Self-pay | Admitting: Family Medicine

## 2009-07-15 ENCOUNTER — Telehealth (INDEPENDENT_AMBULATORY_CARE_PROVIDER_SITE_OTHER): Payer: Self-pay | Admitting: *Deleted

## 2009-07-20 IMAGING — CT CT ANGIO CHEST
2 of 7 series · 19 of 36 positions shown · IV contrast (APPLIED)
Comparison: None

CLINICAL DATA: Chest pain and shortness of breath.

CT ANGIOGRAPHY CHEST
TECHNIQUE: Multidetector CT imaging of the chest using the
standard protocol during bolus administration of intravenous
contrast. Multiplanar reconstructed images obtained and reviewed to
evaluate the vascular anatomy.
Contrast: 52 ml Umnipaque-V2W

[Series 8: pulm embolism 1.0 b25f thins · axial · 0.63mm/px · z∈[-270,-82]mm · 18 of 210 slices shown]
[im 11/210  lung]
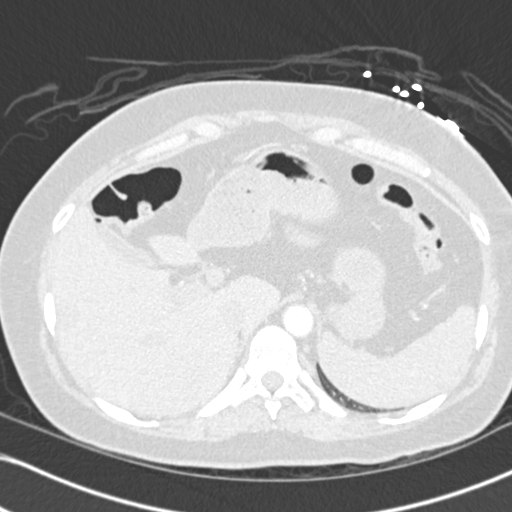
[im 21/210  mediastinal]
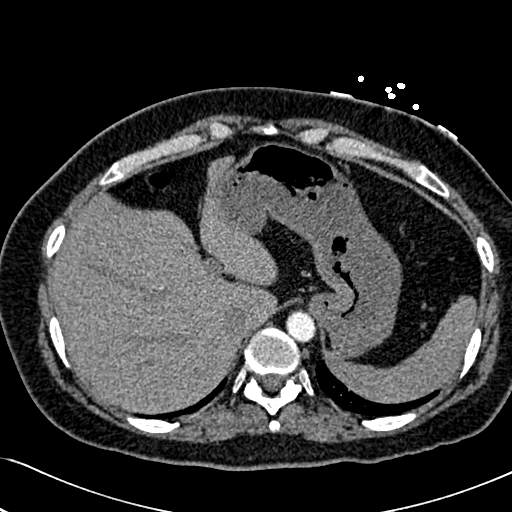
[im 32/210  lung]
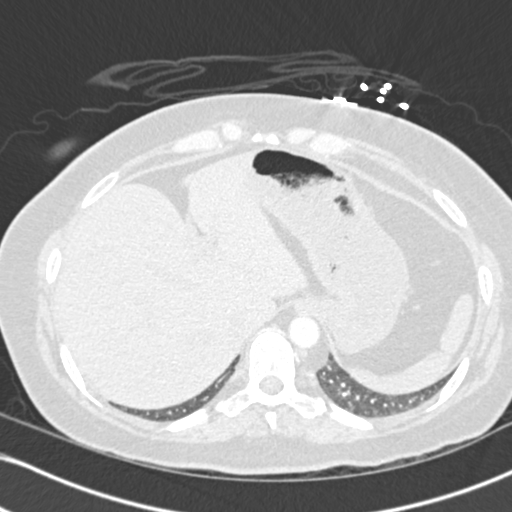
[im 42/210  mediastinal]
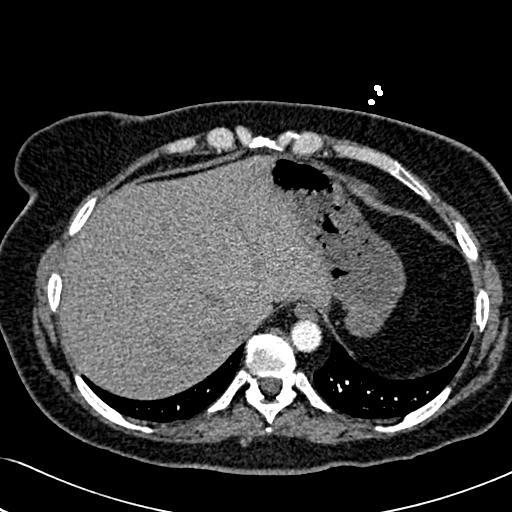
[im 53/210  lung]
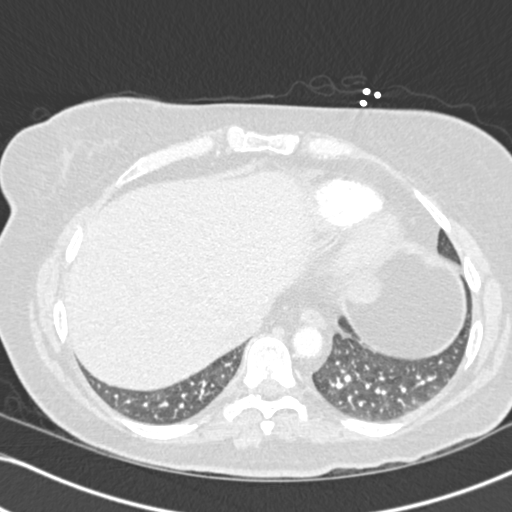
[im 63/210  mediastinal]
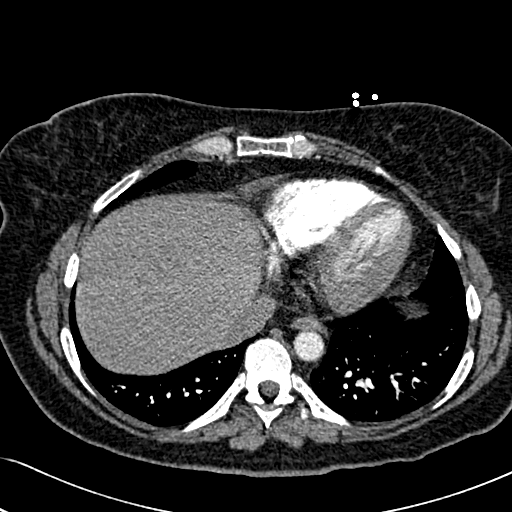
[im 74/210  lung]
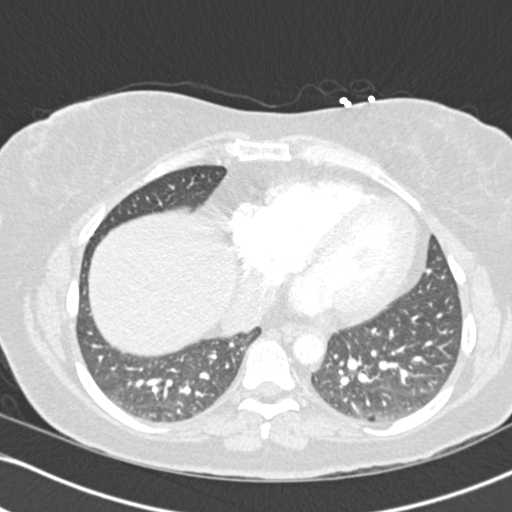
[im 84/210  mediastinal]
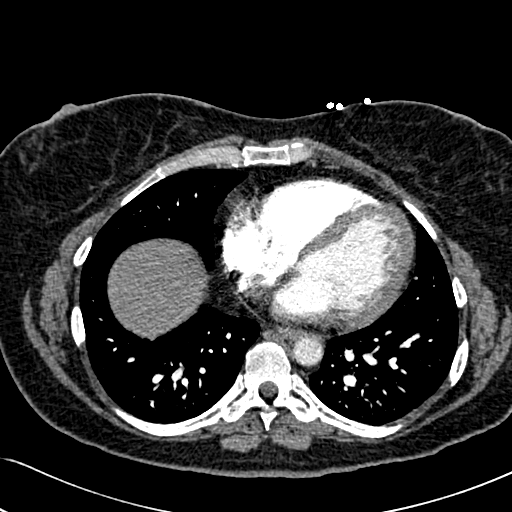
[im 95/210  lung]
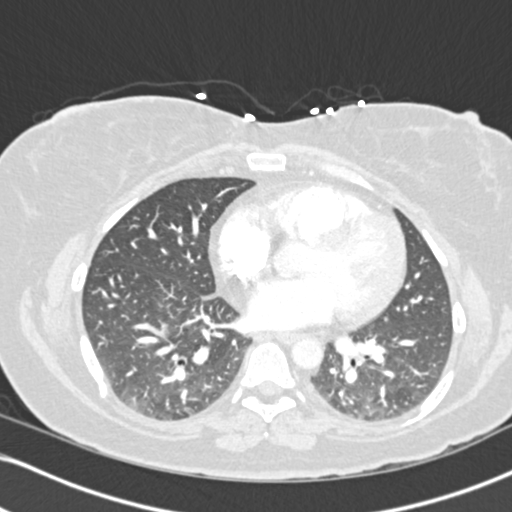
[im 115/210  mediastinal]
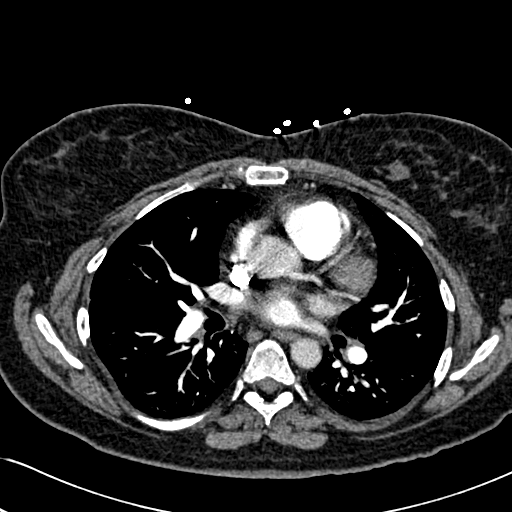
[im 126/210  lung]
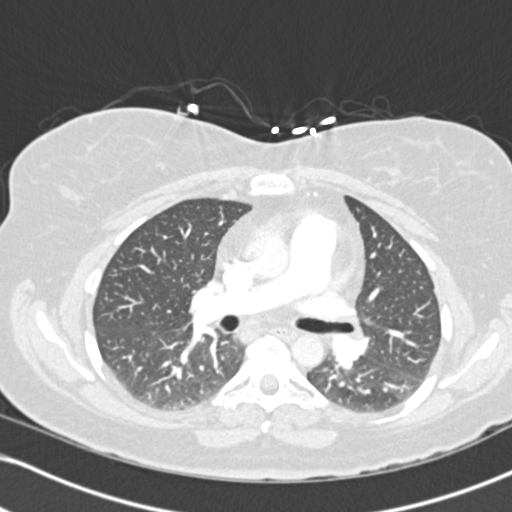
[im 136/210  mediastinal]
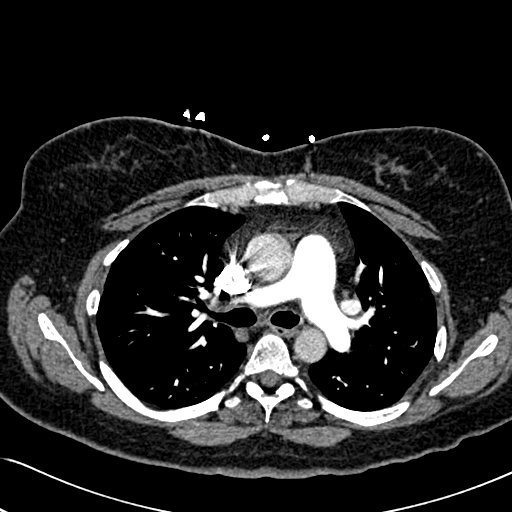
[im 147/210  lung]
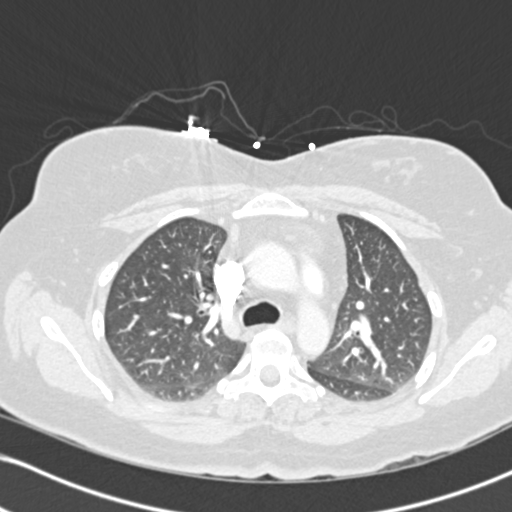
[im 157/210  mediastinal]
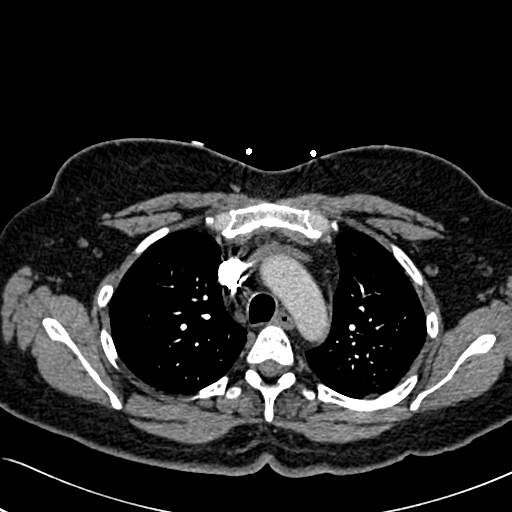
[im 168/210  lung]
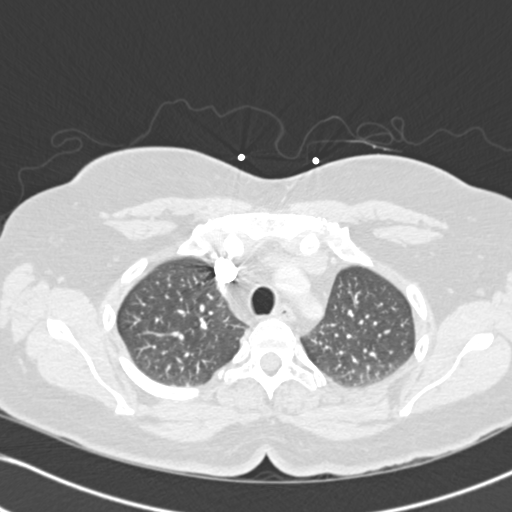
[im 178/210  mediastinal]
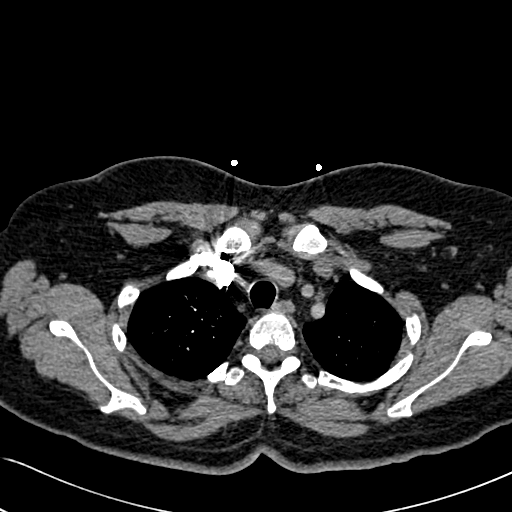
[im 189/210  lung]
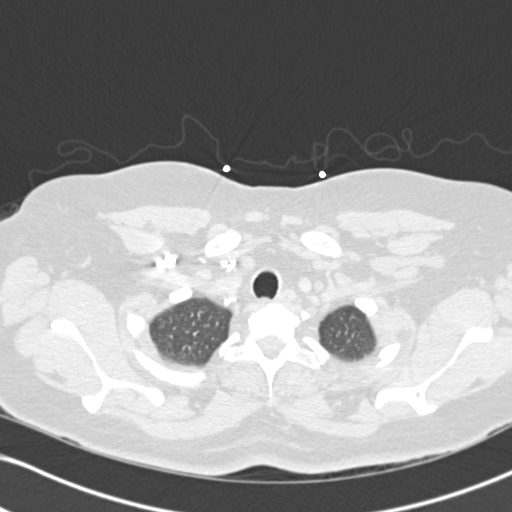
[im 199/210  mediastinal]
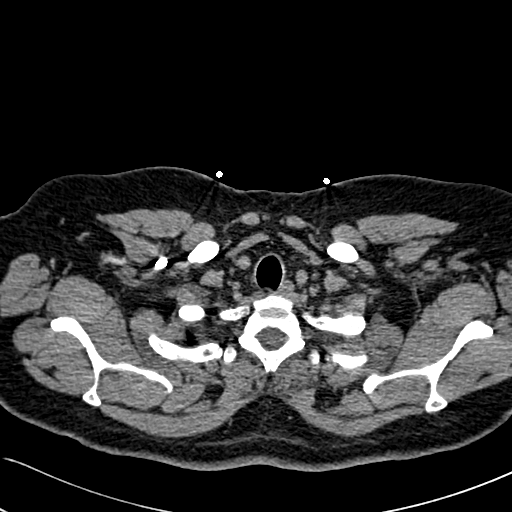

[Series 602: cor · coronal · 0.63mm/px · 1 of 95 slices shown]
[im 48/95  mediastinal]
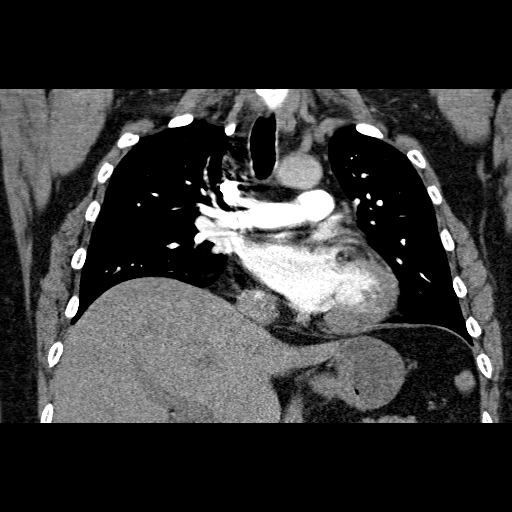

[19 of 36 positions shown; findings below may reference images not displayed]

FINDINGS: No filling defects are identified in the opacified
pulmonary arteries to suggest the presence of an acute pulmonary
embolus.

No lymphadenopathy in the chest.  Heart size is upper normal.
There is no pericardial or pleural effusion.  No thoracic aortic
dissection.

No pneumothorax.  No focal lung consolidation.

Imaging of the upper abdomen shows 1.7 cm subcapsular lesion in the
right liver which cannot be further characterized.  Bone windows
show no focal lytic or sclerotic osseous lesions.
IMPRESSION: No CT evidence for acute pulmonary embolus.

1.7 cm right liver lesion cannot be further characterized.
Dedicated non emergent liver MRI recommended to further
characterize.

## 2009-07-20 IMAGING — CR DG CHEST 1V PORT
1 series · 1 of 1 positions shown · non-contrast
Comparison: 04/19/2008.

CLINICAL DATA: Chest pain.

PORTABLE CHEST - 1 VIEW

[AP]
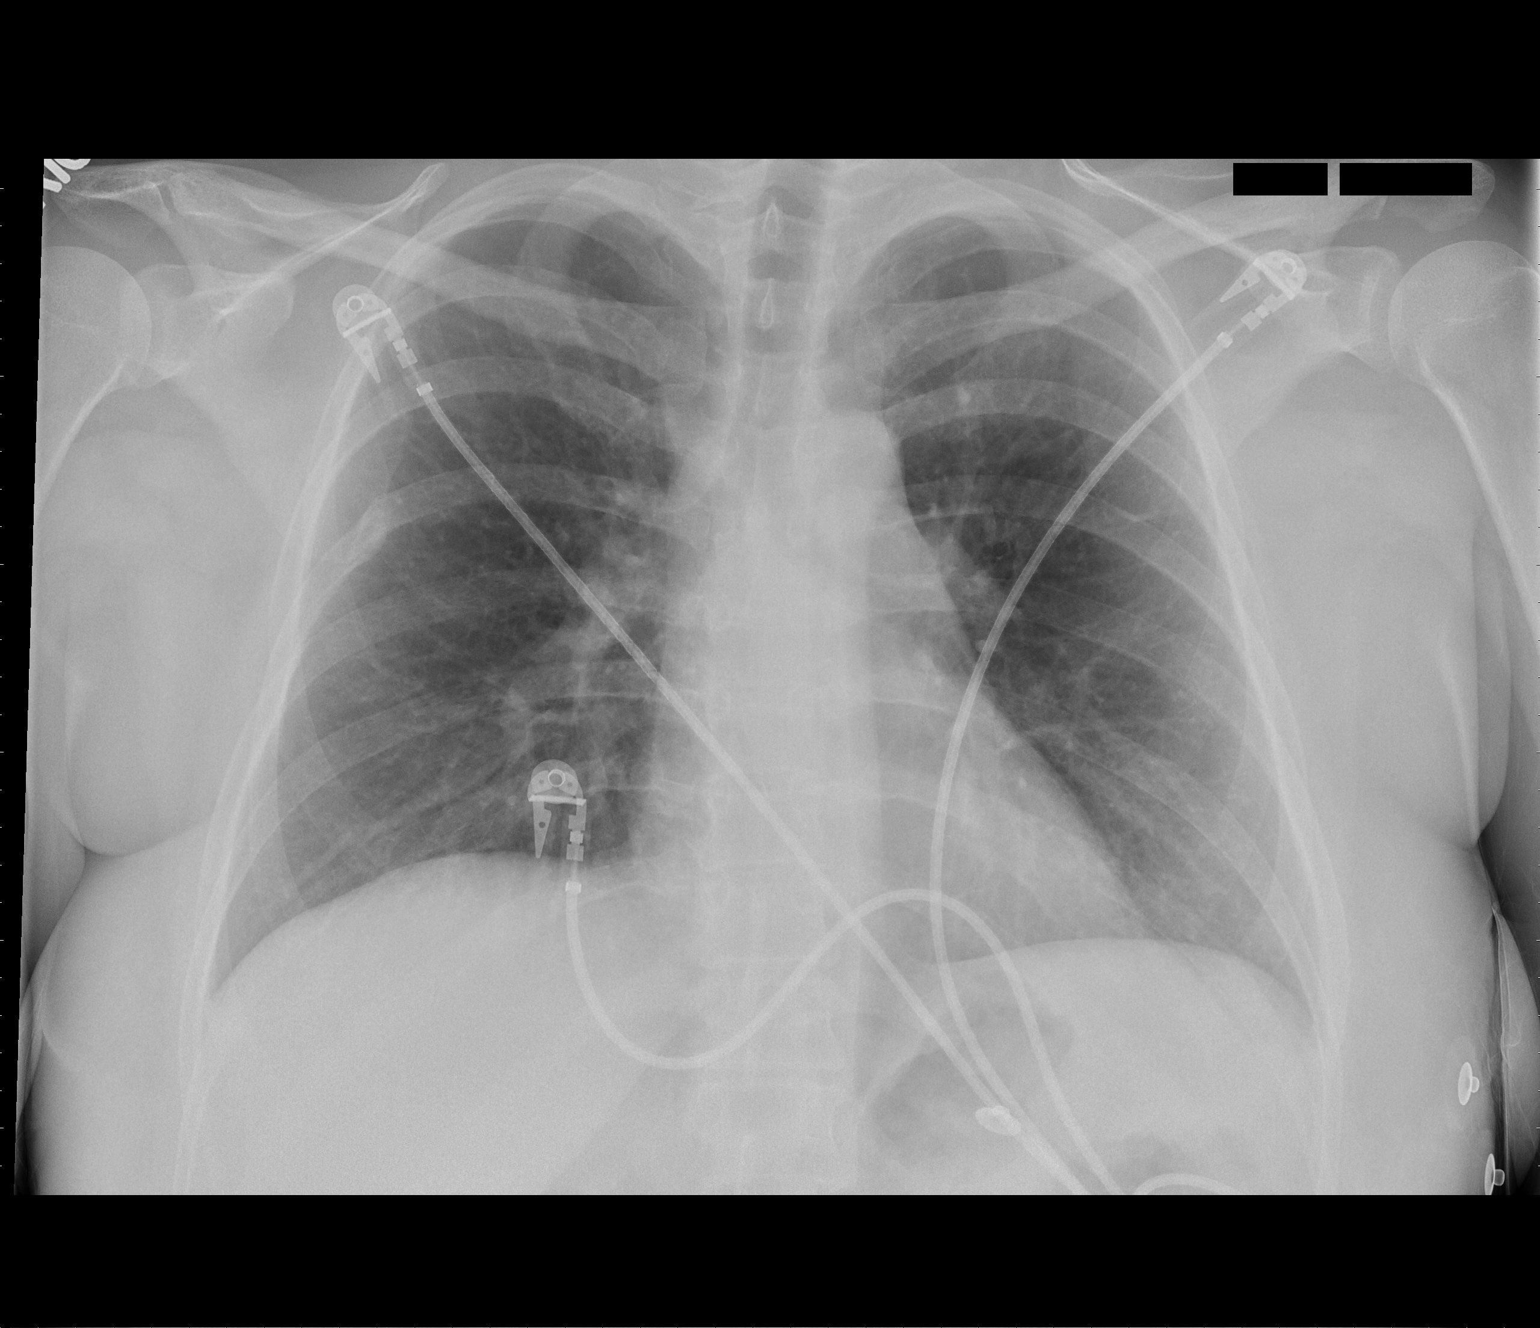

[1 of 1 positions shown; findings below may reference images not displayed]

FINDINGS: Old right sixth rib fracture.  No infiltrate, congestive
heart failure or pneumothorax.  Heart size within normal limits.
IMPRESSION: No infiltrate or congestive heart failure.

## 2009-07-22 ENCOUNTER — Emergency Department (HOSPITAL_COMMUNITY): Admission: EM | Admit: 2009-07-22 | Discharge: 2009-07-22 | Payer: Self-pay | Admitting: Emergency Medicine

## 2009-07-22 ENCOUNTER — Encounter: Payer: Self-pay | Admitting: Critical Care Medicine

## 2009-07-27 ENCOUNTER — Other Ambulatory Visit: Admission: RE | Admit: 2009-07-27 | Discharge: 2009-07-27 | Payer: Self-pay | Admitting: Physician Assistant

## 2009-07-27 ENCOUNTER — Ambulatory Visit: Payer: Self-pay | Admitting: Obstetrics & Gynecology

## 2009-07-30 ENCOUNTER — Emergency Department (HOSPITAL_BASED_OUTPATIENT_CLINIC_OR_DEPARTMENT_OTHER): Admission: EM | Admit: 2009-07-30 | Discharge: 2009-07-31 | Payer: Self-pay | Admitting: Emergency Medicine

## 2009-07-31 ENCOUNTER — Ambulatory Visit: Payer: Self-pay | Admitting: Diagnostic Radiology

## 2009-08-01 ENCOUNTER — Emergency Department (HOSPITAL_BASED_OUTPATIENT_CLINIC_OR_DEPARTMENT_OTHER): Admission: EM | Admit: 2009-08-01 | Discharge: 2009-08-01 | Payer: Self-pay | Admitting: Emergency Medicine

## 2009-08-09 ENCOUNTER — Ambulatory Visit: Payer: Self-pay | Admitting: Diagnostic Radiology

## 2009-08-09 ENCOUNTER — Ambulatory Visit: Payer: Self-pay | Admitting: Family Medicine

## 2009-08-09 ENCOUNTER — Emergency Department (HOSPITAL_BASED_OUTPATIENT_CLINIC_OR_DEPARTMENT_OTHER): Admission: EM | Admit: 2009-08-09 | Discharge: 2009-08-09 | Payer: Self-pay | Admitting: Emergency Medicine

## 2009-08-09 LAB — CONVERTED CEMR LAB
Bilirubin Urine: NEGATIVE
Blood in Urine, dipstick: NEGATIVE
Glucose, Urine, Semiquant: NEGATIVE
Nitrite: NEGATIVE
Protein, U semiquant: 30
Specific Gravity, Urine: 1.025
Urobilinogen, UA: 0.2
WBC Urine, dipstick: NEGATIVE
pH: 5.5

## 2009-08-10 ENCOUNTER — Encounter: Payer: Self-pay | Admitting: Family Medicine

## 2009-08-10 LAB — CONVERTED CEMR LAB
Clue Cells Wet Prep HPF POC: NONE SEEN
Trich, Wet Prep: NONE SEEN

## 2009-08-12 IMAGING — CR DG CHEST 2V
2 series · 2 of 2 positions shown · non-contrast
Comparison: 04/26/2008

CLINICAL DATA: Short of breath

CHEST - 2 VIEW

[w chest pa]
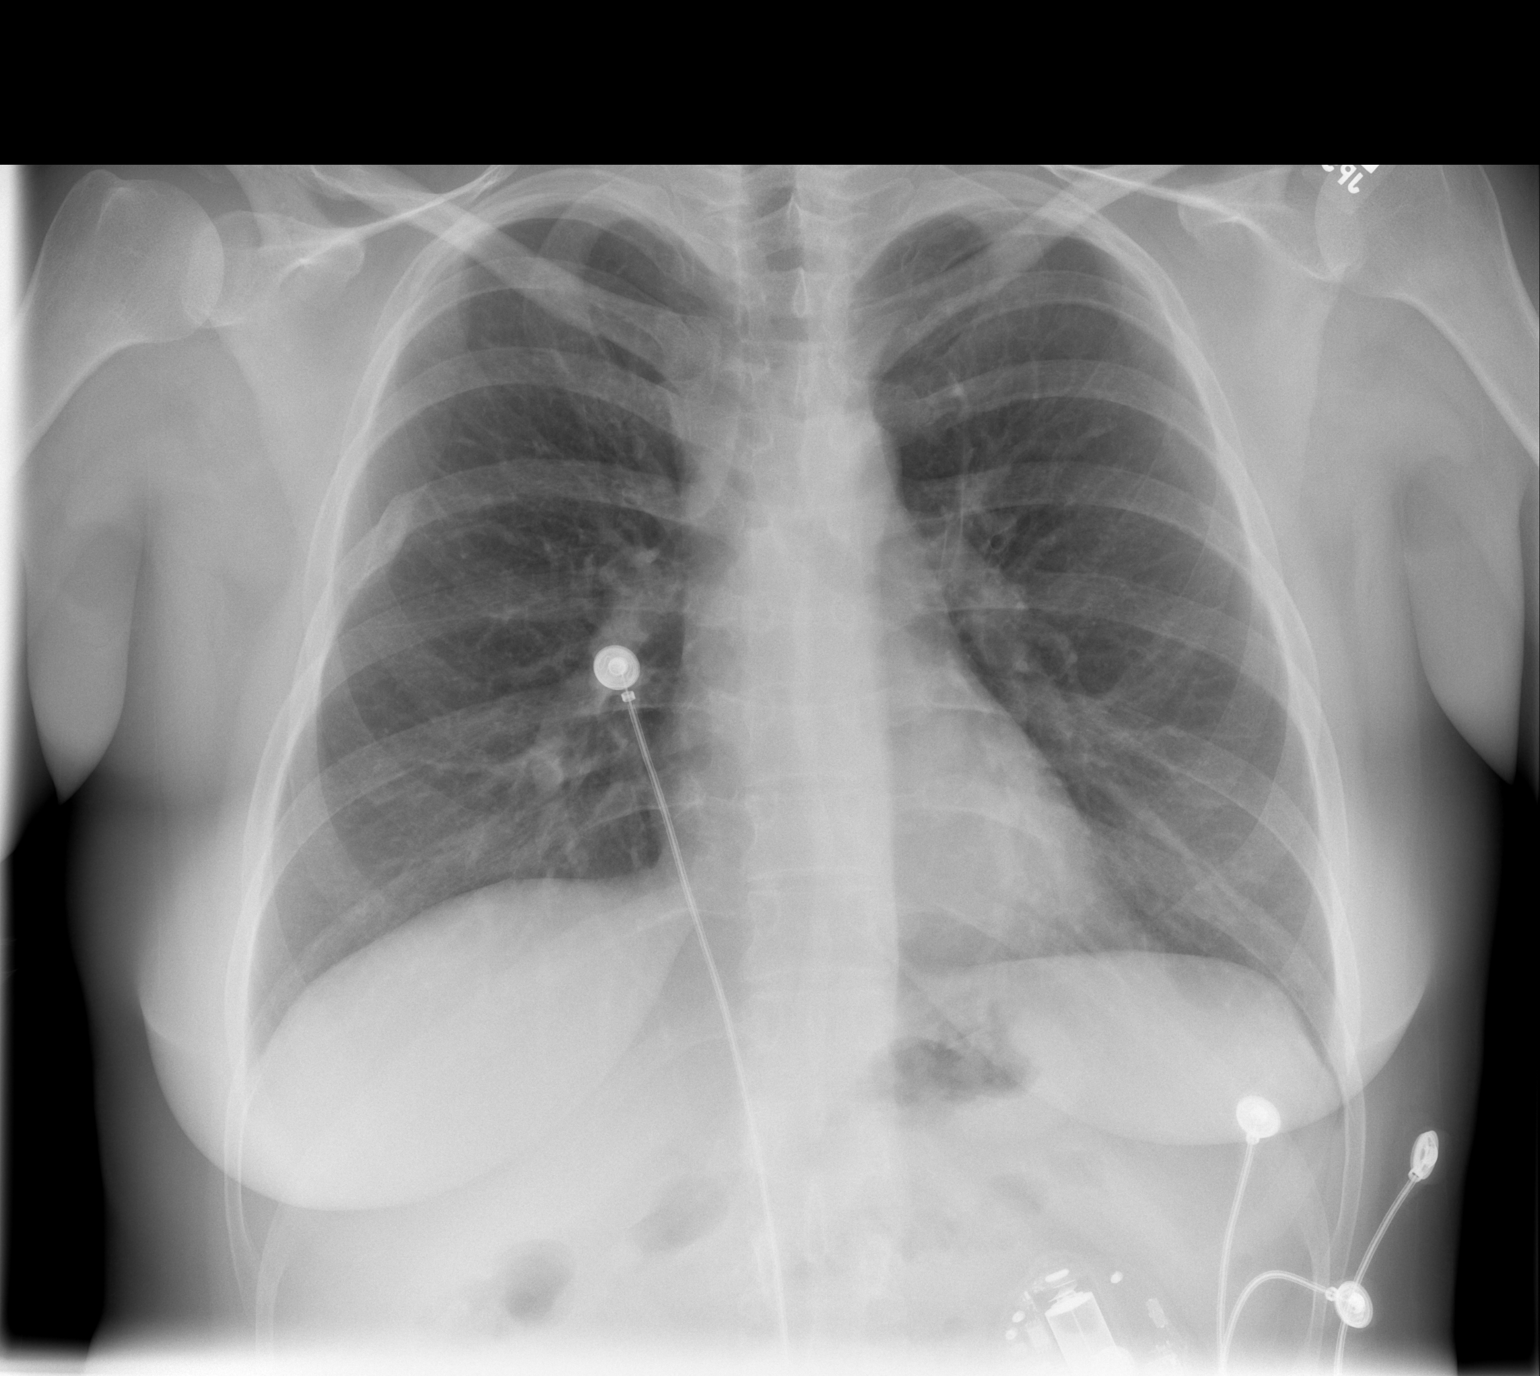

[w chest lat]
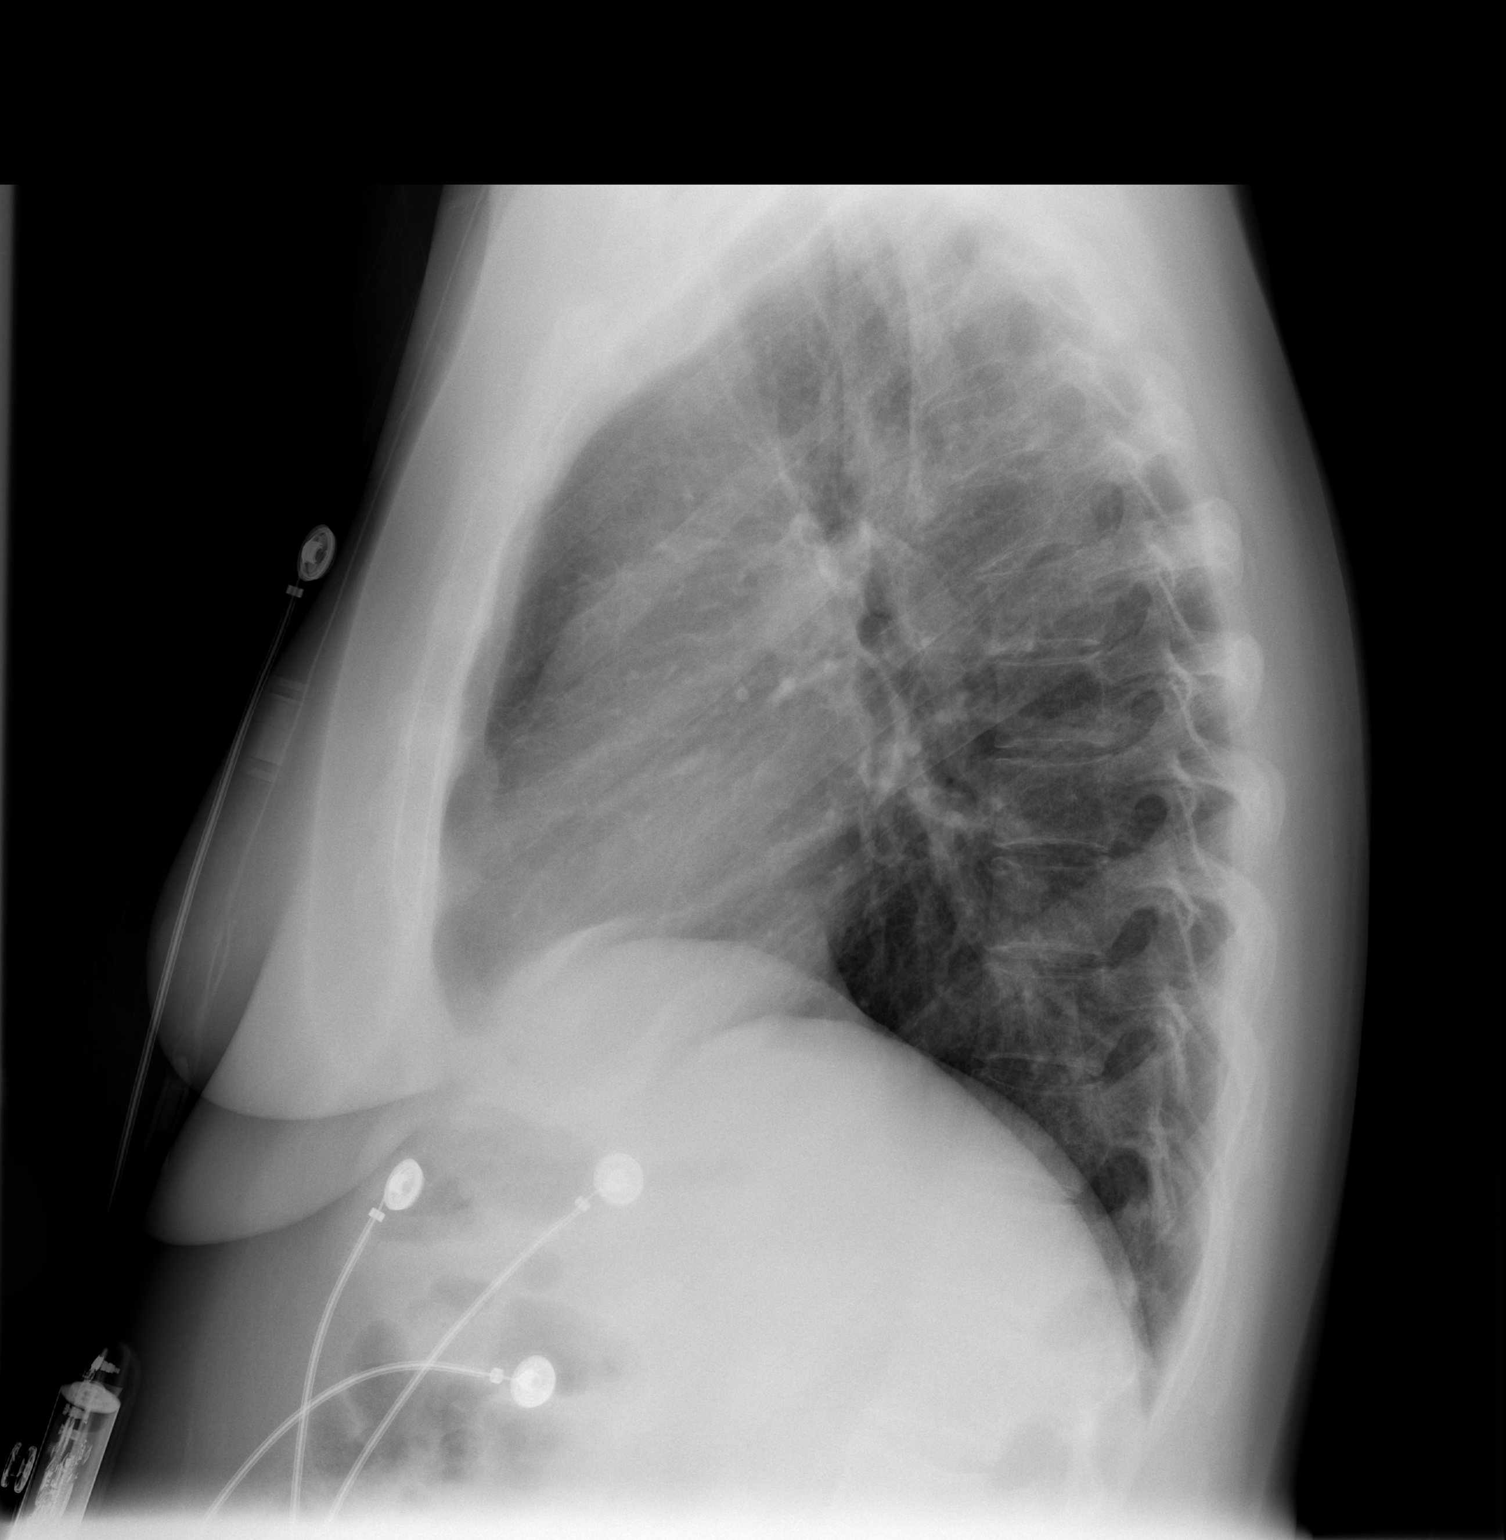

[2 of 2 positions shown; findings below may reference images not displayed]

FINDINGS: Heart and mediastinal contours normal.  Lungs clear.  No
pleural fluid.  Old fracture of the right posterior sixth rib.
IMPRESSION: No active disease.

## 2009-08-13 IMAGING — RF DG ESOPHAGUS
6 of 9 series · 12 of 24 positions shown · IV contrast (agent unspecified)
Comparison: No priors

CLINICAL DATA: Dysphasia

BARIUM SWALLOW / ESOPHAGRAM
TECHNIQUE: Combined double contrast and single contrast
examination performed using effervescent crystals, thick barium
liquid, and thin barium liquid.
Fluoroscopy Time: 2.0 minutes
Contrast: Thin barium

[Series 1: run · 3 of 8 slices shown (1 of 6)]
[im 2/8]
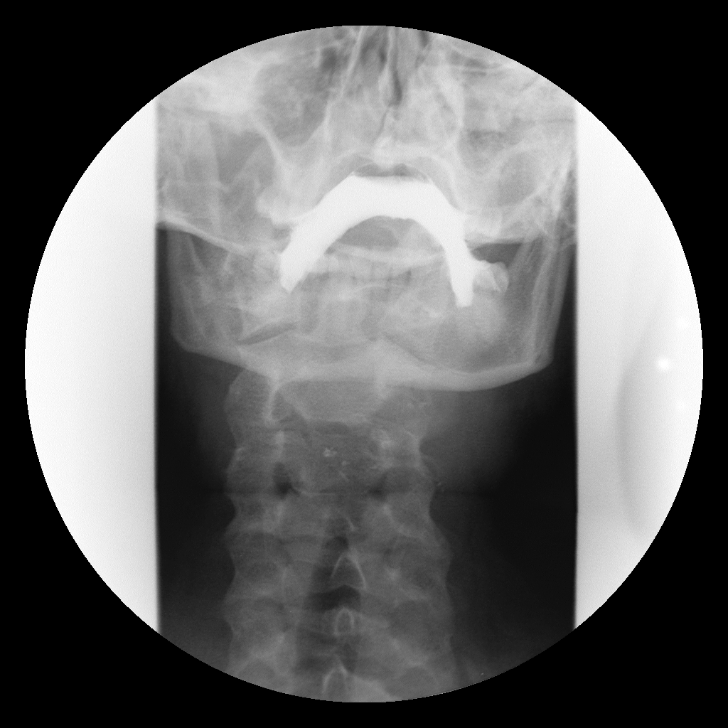
[im 5/8]
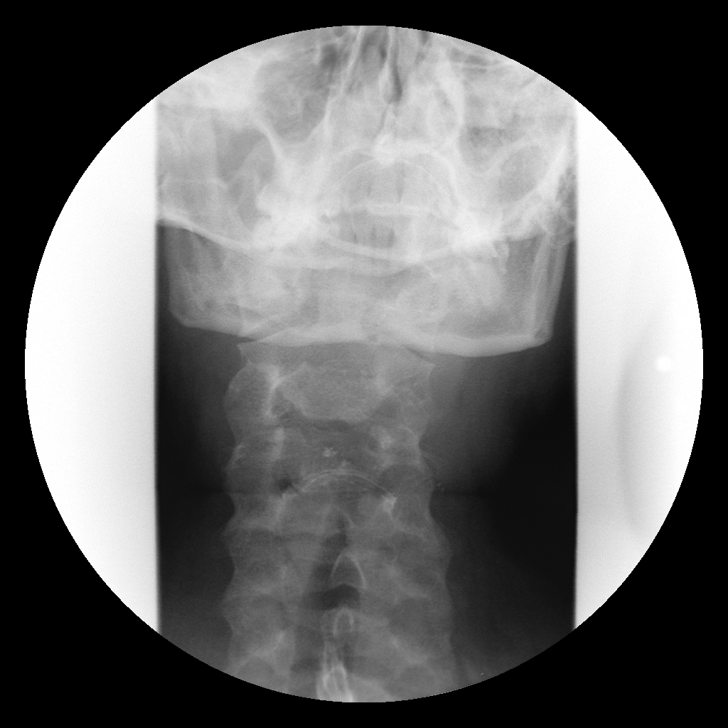
[im 8/8]
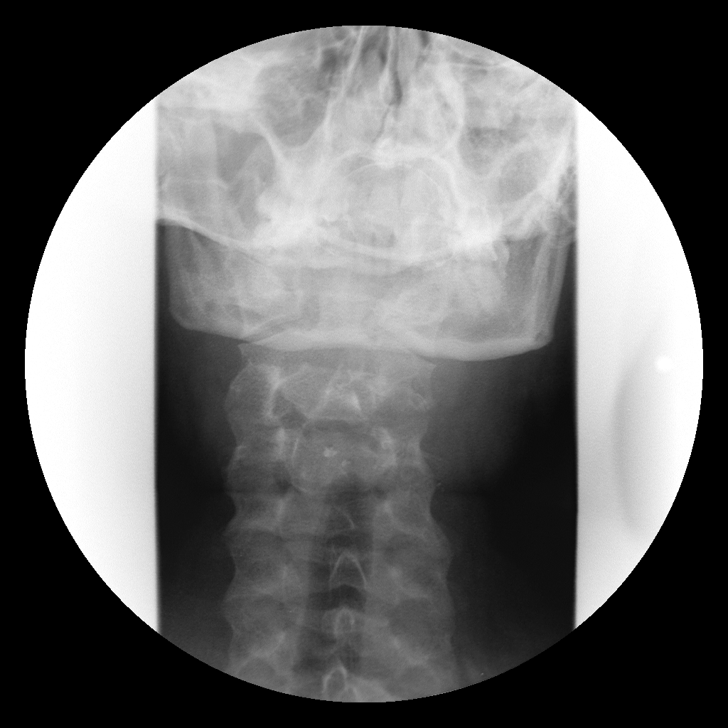

[Series 2: run · 2 of 7 slices shown (2 of 6)]
[im 2/7]
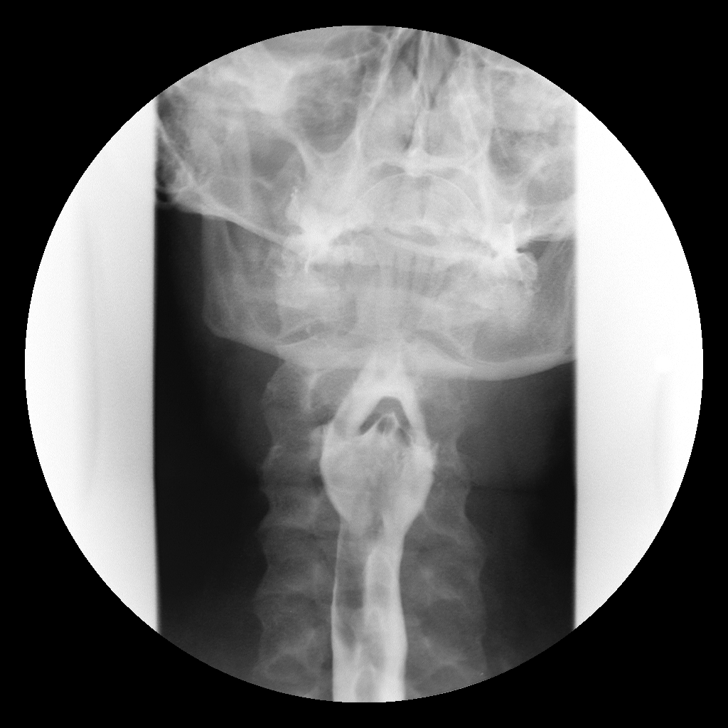
[im 5/7]
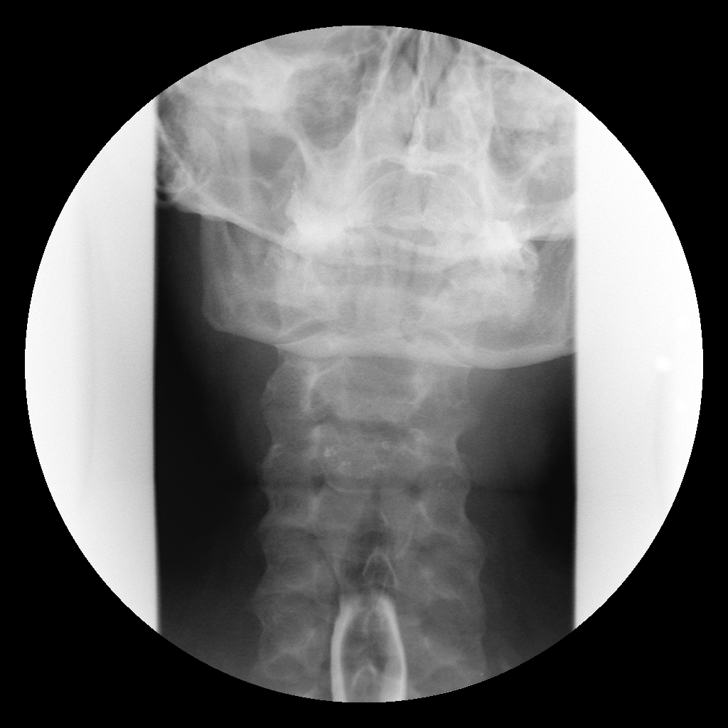

[Series 3: run · 4 of 8 slices shown (3 of 6)]
[im 1/8]
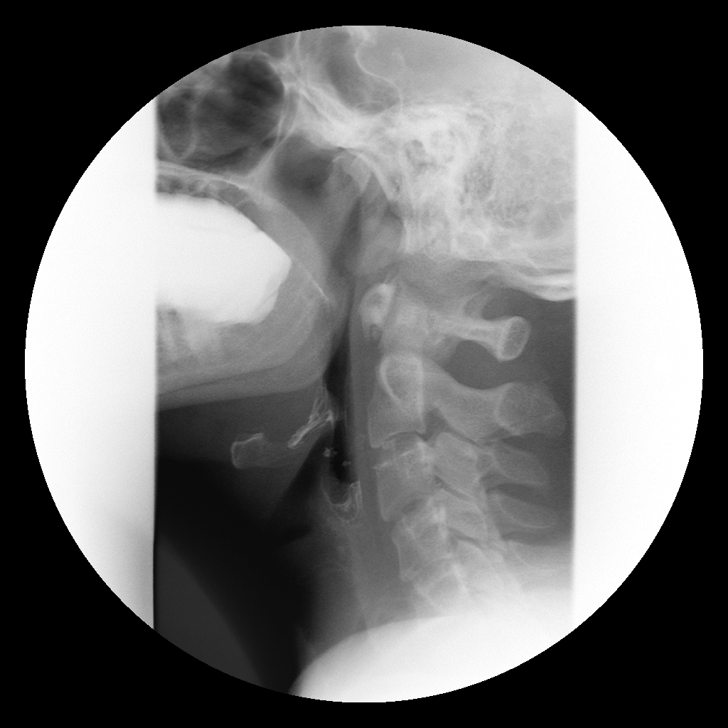
[im 3/8]
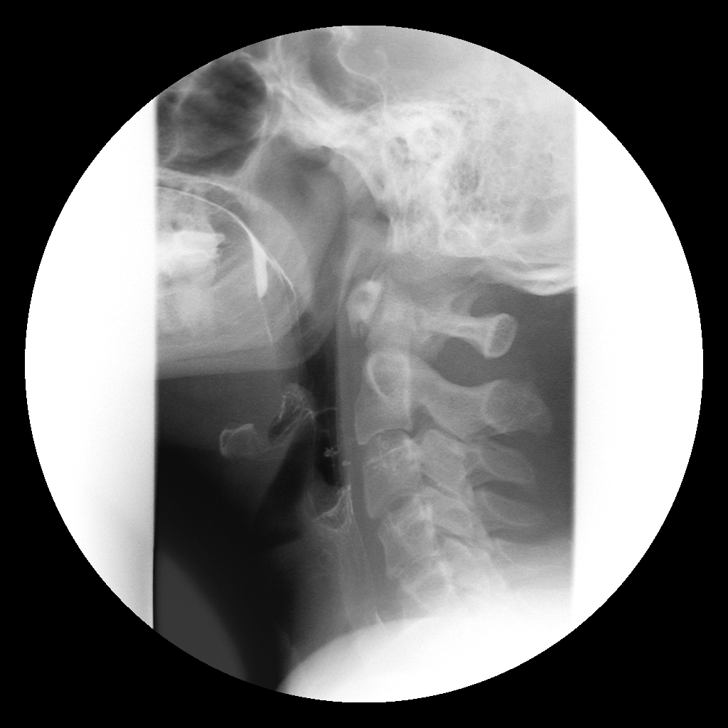
[im 5/8]
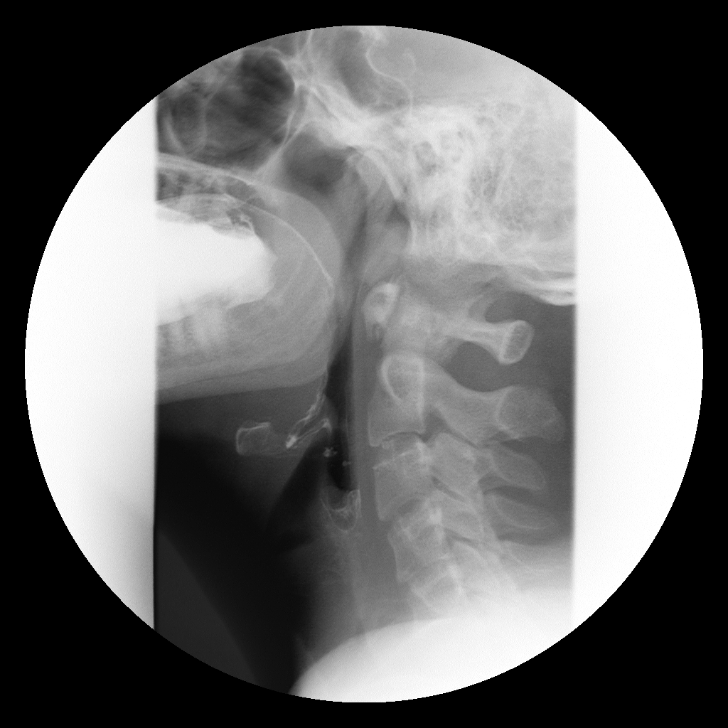
[im 8/8]
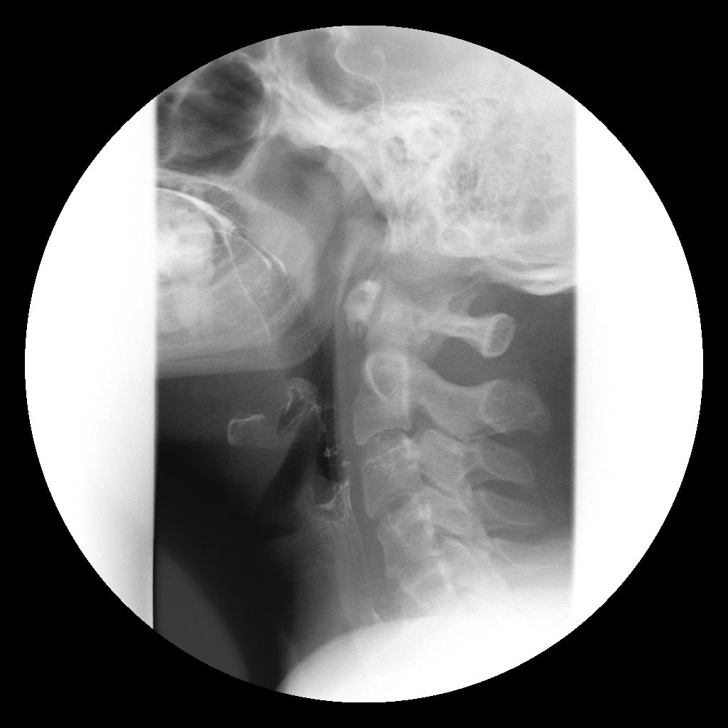

[Series 5: run · 1 of 1 slices shown (4 of 6)]
[im 1/1]
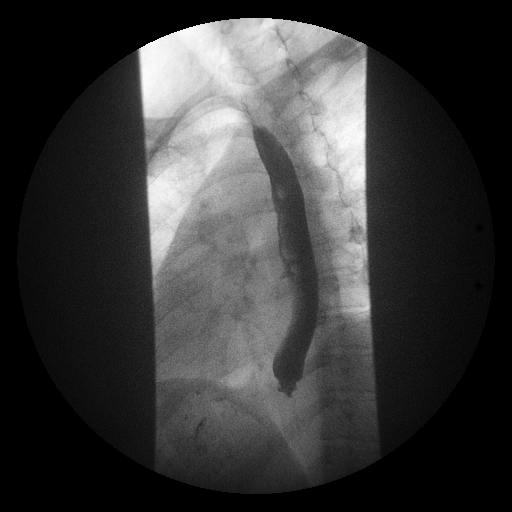

[Series 7: run · 1 of 1 slices shown (5 of 6)]
[im 1/1]
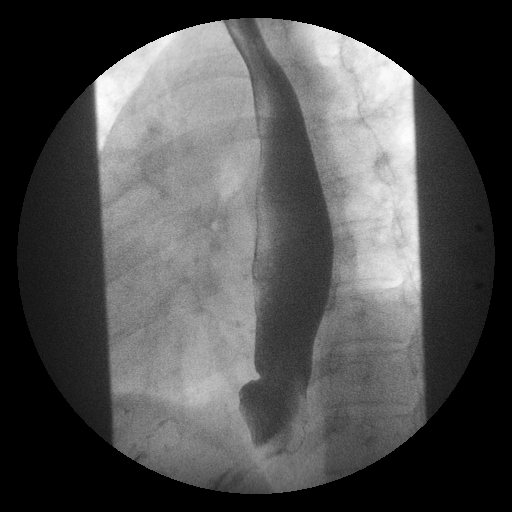

[Series 9: run · 1 of 1 slices shown (6 of 6)]
[im 1/1]
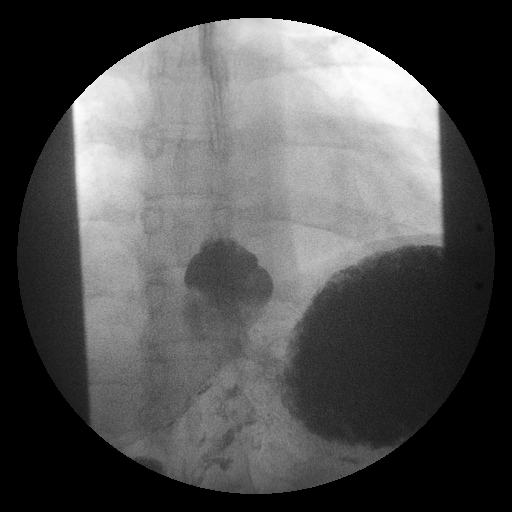

[12 of 24 positions shown; findings below may reference images not displayed]

FINDINGS: Included in this exam are three per second spot films
through the cervical region, AP and lateral, and ingestion of 13 mm
barium tablet. The swallowing mechanism is normal.  No penetration
or aspiration.

There are no obstructing lesions of the esophagus.  Peristalsis is
normal.

There is a small sliding hiatal hernia.  No reflux, changes of
esophagitis, or stricture noted.  A 13 mm barium tablet traverses
the GE junction without delay.
IMPRESSION: Normal examination except for a small sliding hiatal hernia,
without demonstration of reflux, stricture, or radiographic changes
of esophagitis.

## 2009-08-21 IMAGING — CT CT PELVIS W/ CM
2 of 5 series · 16 of 46 positions shown, 18 images · IV contrast (APPLIED)
Comparison: 12/04/2007

CT ABDOMEN

CLINICAL DATA: Right side abdominal pain, vomiting, dehydration,
past history appendectomy and tubal ligation

CT ABDOMEN AND PELVIS WITH CONTRAST
TECHNIQUE: Multidetector CT imaging of the abdomen and pelvis was
performed using the standard protocol following bolus
administration of intravenous contrast. Patient refused GI
contrast. Sagittal and coronal MPR images reconstructed from axial
data set.
Contrast: 100 ml Fmnipaque-MHH

[Series 2: abd/pelvis 5.0 b31f · axial · 0.83mm/px · z∈[-507,-112]mm · 13 of 89 slices shown, 15 images]
[im 5/89  soft-tissue]
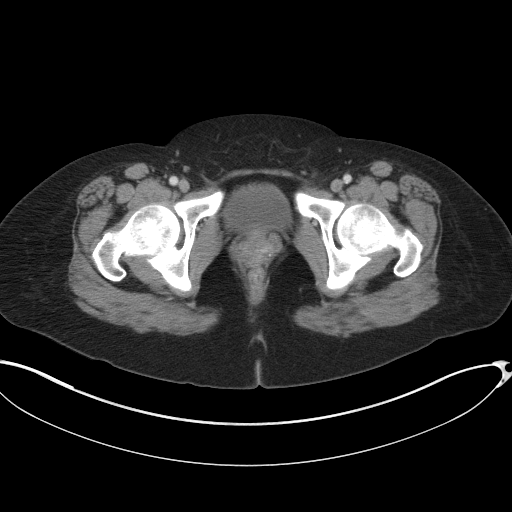
[im 5/89  bone]
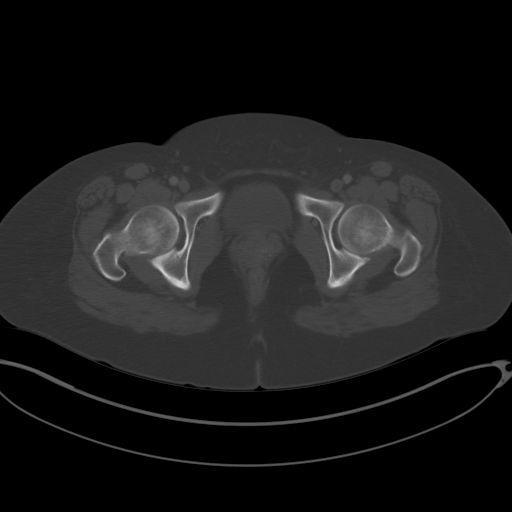
[im 10/89  soft-tissue]
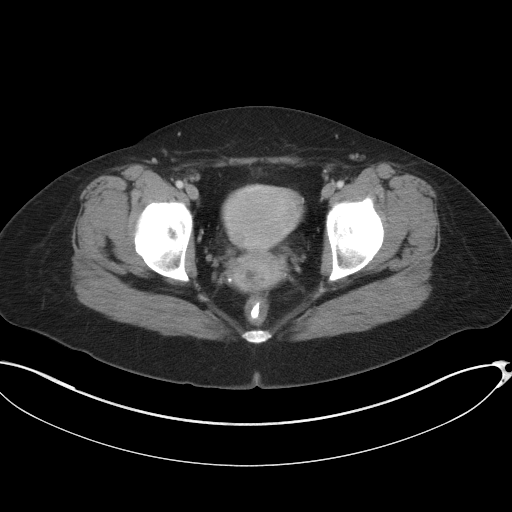
[im 20/89  soft-tissue]
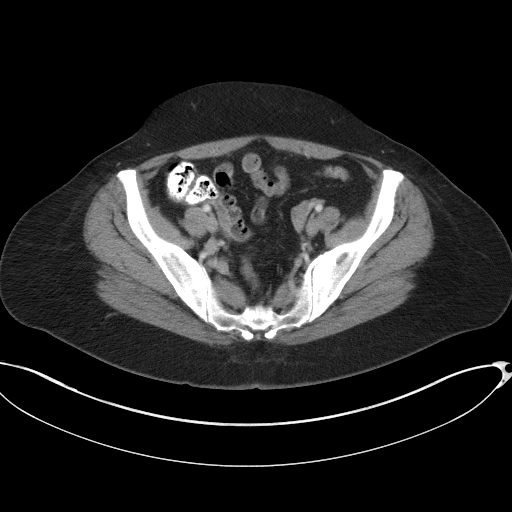
[im 25/89  soft-tissue]
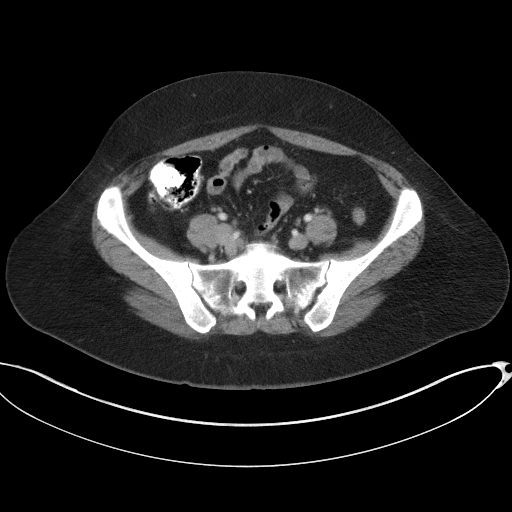
[im 30/89  soft-tissue]
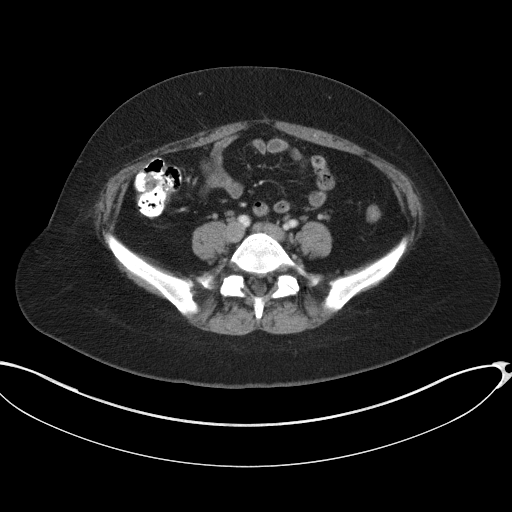
[im 40/89  soft-tissue]
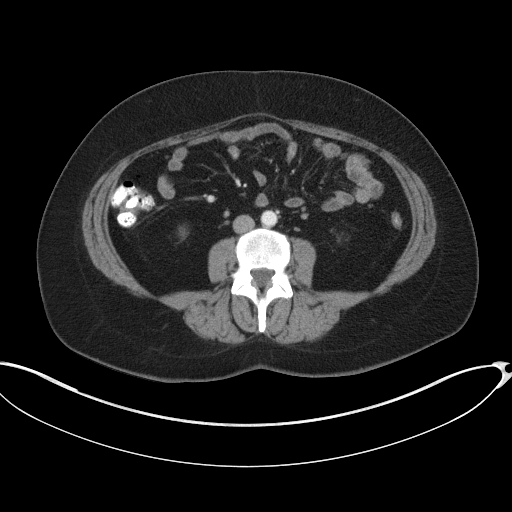
[im 45/89  soft-tissue]
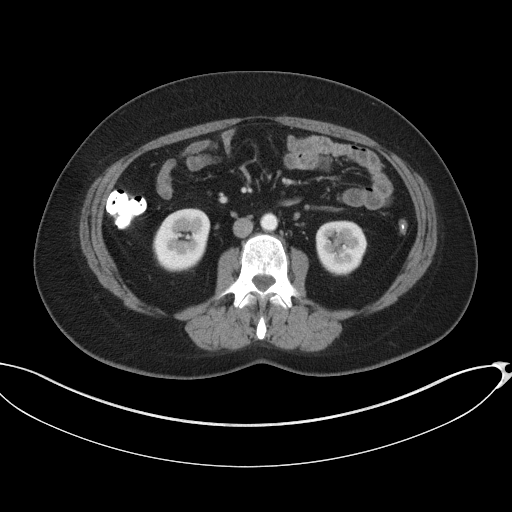
[im 49/89  soft-tissue]
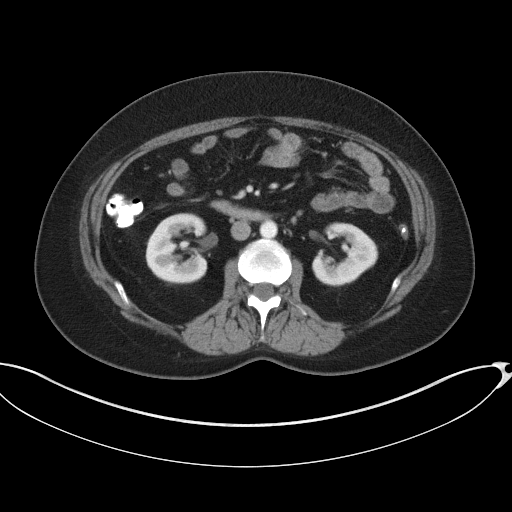
[im 59/89  soft-tissue]
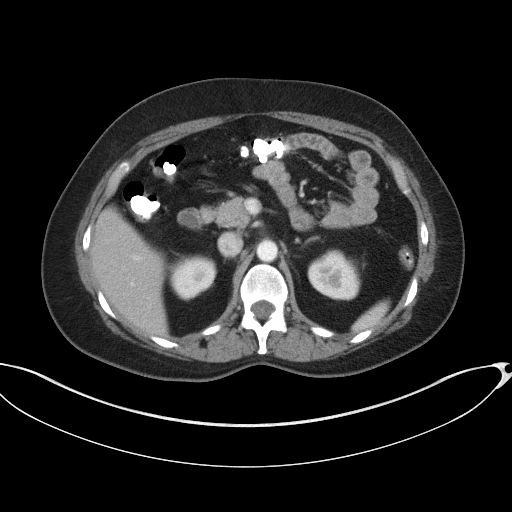
[im 59/89  bone]
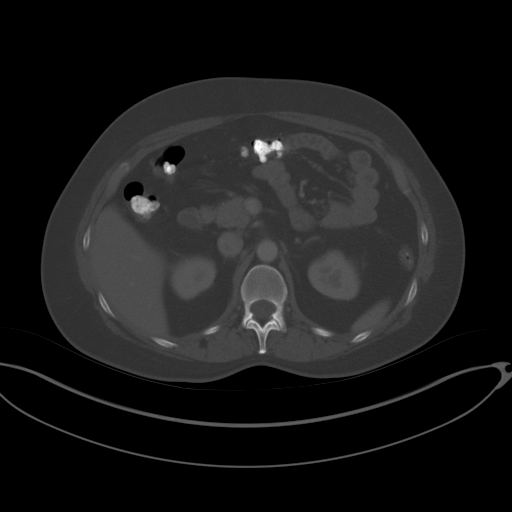
[im 64/89  soft-tissue]
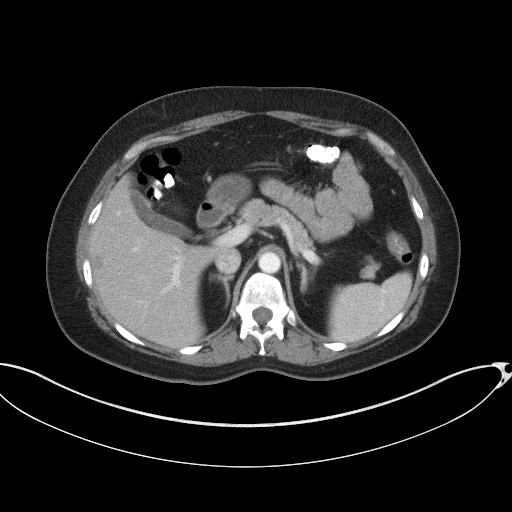
[im 69/89  soft-tissue]
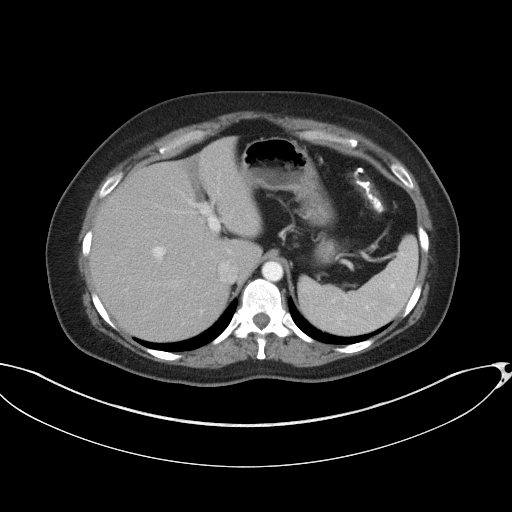
[im 79/89  soft-tissue]
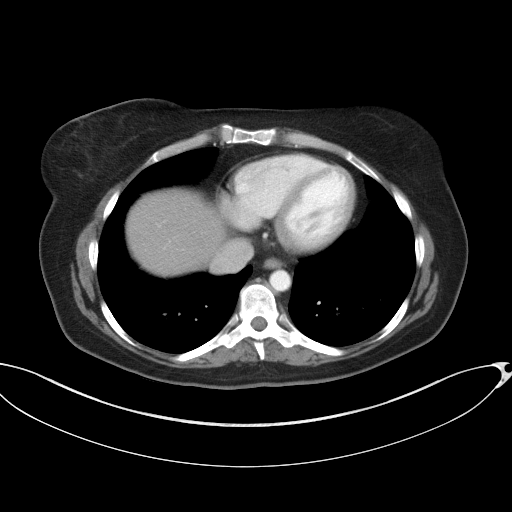
[im 84/89  soft-tissue]
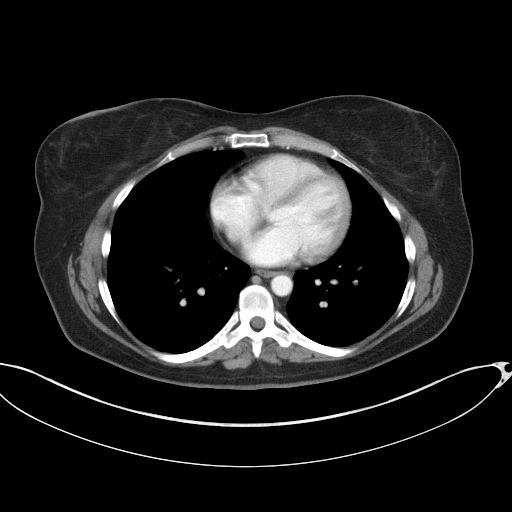

[Series 3: abd/pelvis 2.0 coronal · coronal · 0.79mm/px · 3 of 113 slices shown]
[im 38/113  soft-tissue]
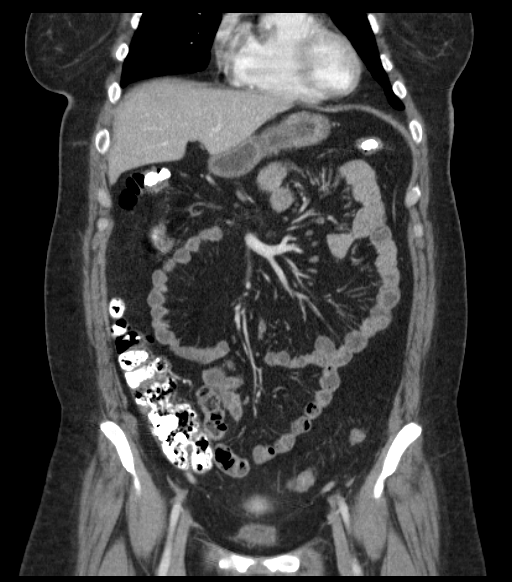
[im 50/113  soft-tissue]
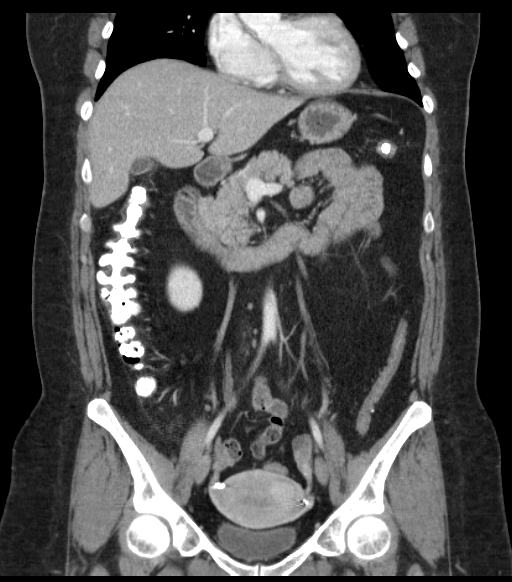
[im 63/113  soft-tissue]
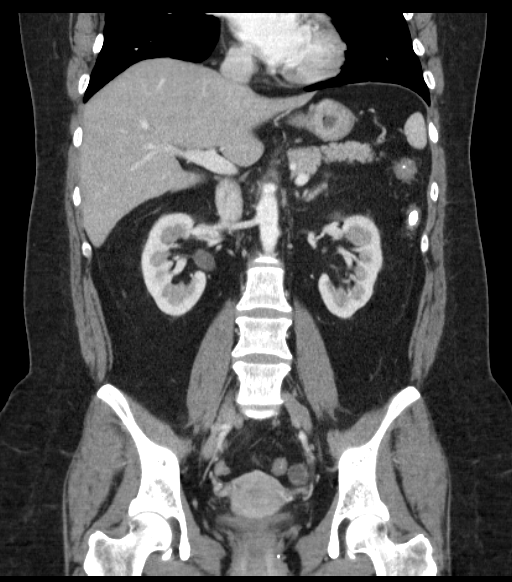

[16 of 46 positions shown; findings below may reference images not displayed]

FINDINGS: Lung bases clear.
Small mass lateral aspect right lobe liver, 1.9 x 1.5 cm image 24,
showing nodular peripheral enhancement on early phase and partial
film on delayed images, question hemangioma.
This lesion is subtly present on the preceding noncontrast CT of
12/04/2007.
Remainder of liver, spleen, pancreas, kidneys, and adrenal glands
normal.
No upper abdominal mass, adenopathy, free fluid, or inflammatory
process.
Stomach and upper abdominal small bowel loops normal.
Dense retained barium within colon, likely from prior esophagram of
05/20/2008.
IMPRESSION: Question hepatic hemangioma 1.9 x 1.5 cm, lesion grossly unchanged
in size since prior CT exams of 08/30/2017 and 12/04/2007.
No acute upper abdominal abnormalities.

CT PELVIS
FINDINGS: Small left ovarian cyst 1.9 x 1.8 cm image 74.
Small hyperdense nodule left upper uterine segment, question
leiomyoma, 2.1 x 1.8 cm image 77.
Status post tubal ligation and appendectomy by history.
No pelvic mass, adenopathy, free fluid, or inflammatory process.
No focal bony abnormalities.
IMPRESSION: Small left ovarian cyst.
Probable leiomyoma left uterus near fundus.
No other intrapelvic abnormalities.

## 2009-08-27 ENCOUNTER — Ambulatory Visit: Payer: Self-pay | Admitting: Diagnostic Radiology

## 2009-08-27 ENCOUNTER — Emergency Department (HOSPITAL_BASED_OUTPATIENT_CLINIC_OR_DEPARTMENT_OTHER): Admission: EM | Admit: 2009-08-27 | Discharge: 2009-08-27 | Payer: Self-pay | Admitting: Emergency Medicine

## 2009-08-31 ENCOUNTER — Telehealth: Payer: Self-pay | Admitting: Family Medicine

## 2009-09-01 ENCOUNTER — Ambulatory Visit: Payer: Self-pay | Admitting: Family Medicine

## 2009-09-02 ENCOUNTER — Encounter: Payer: Self-pay | Admitting: Family Medicine

## 2009-09-02 ENCOUNTER — Ambulatory Visit: Payer: Self-pay | Admitting: Diagnostic Radiology

## 2009-09-02 ENCOUNTER — Emergency Department (HOSPITAL_BASED_OUTPATIENT_CLINIC_OR_DEPARTMENT_OTHER): Admission: EM | Admit: 2009-09-02 | Discharge: 2009-09-02 | Payer: Self-pay | Admitting: Emergency Medicine

## 2009-09-06 ENCOUNTER — Ambulatory Visit: Payer: Self-pay | Admitting: Family Medicine

## 2009-09-06 DIAGNOSIS — M1711 Unilateral primary osteoarthritis, right knee: Secondary | ICD-10-CM | POA: Insufficient documentation

## 2009-09-07 ENCOUNTER — Encounter: Payer: Self-pay | Admitting: Internal Medicine

## 2009-09-07 ENCOUNTER — Ambulatory Visit: Payer: Self-pay | Admitting: Internal Medicine

## 2009-09-09 ENCOUNTER — Encounter (INDEPENDENT_AMBULATORY_CARE_PROVIDER_SITE_OTHER): Payer: Self-pay | Admitting: *Deleted

## 2009-09-09 LAB — CONVERTED CEMR LAB
Cholesterol: 212 mg/dL — ABNORMAL HIGH (ref 0–200)
Direct LDL: 152.7 mg/dL
HDL: 49.9 mg/dL (ref >39.00)
Total CHOL/HDL Ratio: 4
Triglycerides: 103 mg/dL (ref 0.0–149.0)
VLDL: 20.6 mg/dL (ref 0.0–40.0)

## 2009-09-13 ENCOUNTER — Telehealth: Payer: Self-pay | Admitting: Internal Medicine

## 2009-09-16 ENCOUNTER — Encounter (INDEPENDENT_AMBULATORY_CARE_PROVIDER_SITE_OTHER): Payer: Self-pay | Admitting: *Deleted

## 2009-09-22 ENCOUNTER — Encounter: Payer: Self-pay | Admitting: Family Medicine

## 2009-09-24 ENCOUNTER — Ambulatory Visit: Payer: Self-pay | Admitting: Internal Medicine

## 2009-09-26 ENCOUNTER — Emergency Department (HOSPITAL_BASED_OUTPATIENT_CLINIC_OR_DEPARTMENT_OTHER): Admission: EM | Admit: 2009-09-26 | Discharge: 2009-09-27 | Payer: Self-pay | Admitting: Emergency Medicine

## 2009-09-27 ENCOUNTER — Ambulatory Visit: Payer: Self-pay | Admitting: Diagnostic Radiology

## 2009-09-30 ENCOUNTER — Ambulatory Visit: Payer: Self-pay | Admitting: Critical Care Medicine

## 2009-09-30 ENCOUNTER — Ambulatory Visit: Payer: Self-pay | Admitting: Family Medicine

## 2009-09-30 DIAGNOSIS — R209 Unspecified disturbances of skin sensation: Secondary | ICD-10-CM | POA: Insufficient documentation

## 2009-10-01 ENCOUNTER — Telehealth: Payer: Self-pay | Admitting: Family Medicine

## 2009-10-07 ENCOUNTER — Encounter: Payer: Self-pay | Admitting: Critical Care Medicine

## 2009-10-07 ENCOUNTER — Telehealth: Payer: Self-pay | Admitting: Critical Care Medicine

## 2009-10-08 ENCOUNTER — Telehealth: Payer: Self-pay | Admitting: Family Medicine

## 2009-10-11 ENCOUNTER — Encounter: Payer: Self-pay | Admitting: Family Medicine

## 2009-10-12 ENCOUNTER — Emergency Department (HOSPITAL_COMMUNITY): Admission: EM | Admit: 2009-10-12 | Discharge: 2009-10-12 | Payer: Self-pay | Admitting: Emergency Medicine

## 2009-10-12 ENCOUNTER — Ambulatory Visit: Payer: Self-pay | Admitting: Family Medicine

## 2009-10-13 ENCOUNTER — Encounter: Payer: Self-pay | Admitting: Family Medicine

## 2009-10-13 ENCOUNTER — Telehealth: Payer: Self-pay | Admitting: Family Medicine

## 2009-10-13 LAB — CONVERTED CEMR LAB
Trich, Wet Prep: NONE SEEN
WBC, Wet Prep HPF POC: NONE SEEN
Yeast Wet Prep HPF POC: NONE SEEN

## 2009-10-14 ENCOUNTER — Telehealth: Payer: Self-pay | Admitting: Family Medicine

## 2009-10-19 ENCOUNTER — Emergency Department (HOSPITAL_BASED_OUTPATIENT_CLINIC_OR_DEPARTMENT_OTHER): Admission: EM | Admit: 2009-10-19 | Discharge: 2009-10-19 | Payer: Self-pay | Admitting: Emergency Medicine

## 2009-10-19 ENCOUNTER — Ambulatory Visit: Payer: Self-pay | Admitting: Diagnostic Radiology

## 2009-10-25 ENCOUNTER — Telehealth (INDEPENDENT_AMBULATORY_CARE_PROVIDER_SITE_OTHER): Payer: Self-pay | Admitting: *Deleted

## 2009-10-26 ENCOUNTER — Telehealth (INDEPENDENT_AMBULATORY_CARE_PROVIDER_SITE_OTHER): Payer: Self-pay | Admitting: *Deleted

## 2009-10-28 IMAGING — CR DG ABDOMEN 1V
2 series · 2 of 2 positions shown · non-contrast
Comparison: Abdominal CT 05/28/2008.

CLINICAL DATA: Right-sided abdominal pain.  History of irritable
bowel syndrome.

ABDOMEN - 1 VIEW

[view not recorded (1 of 2)]
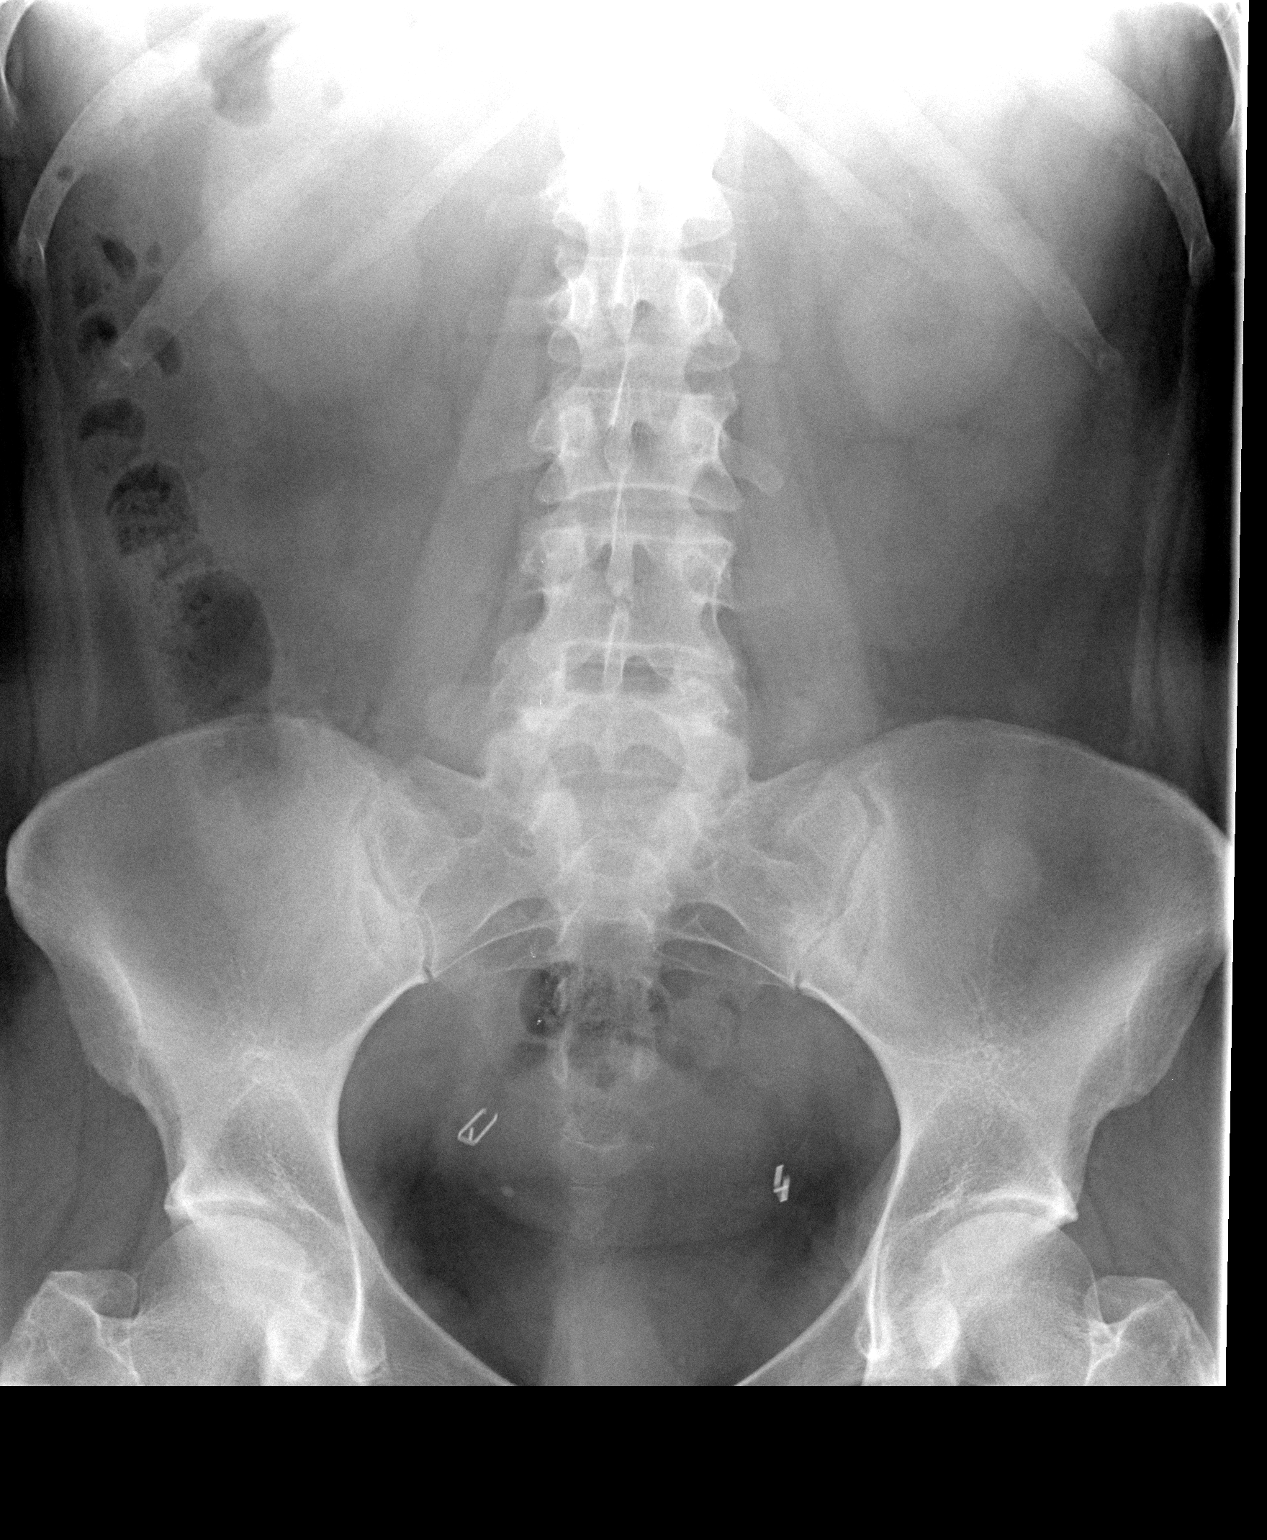

[view not recorded (2 of 2)]
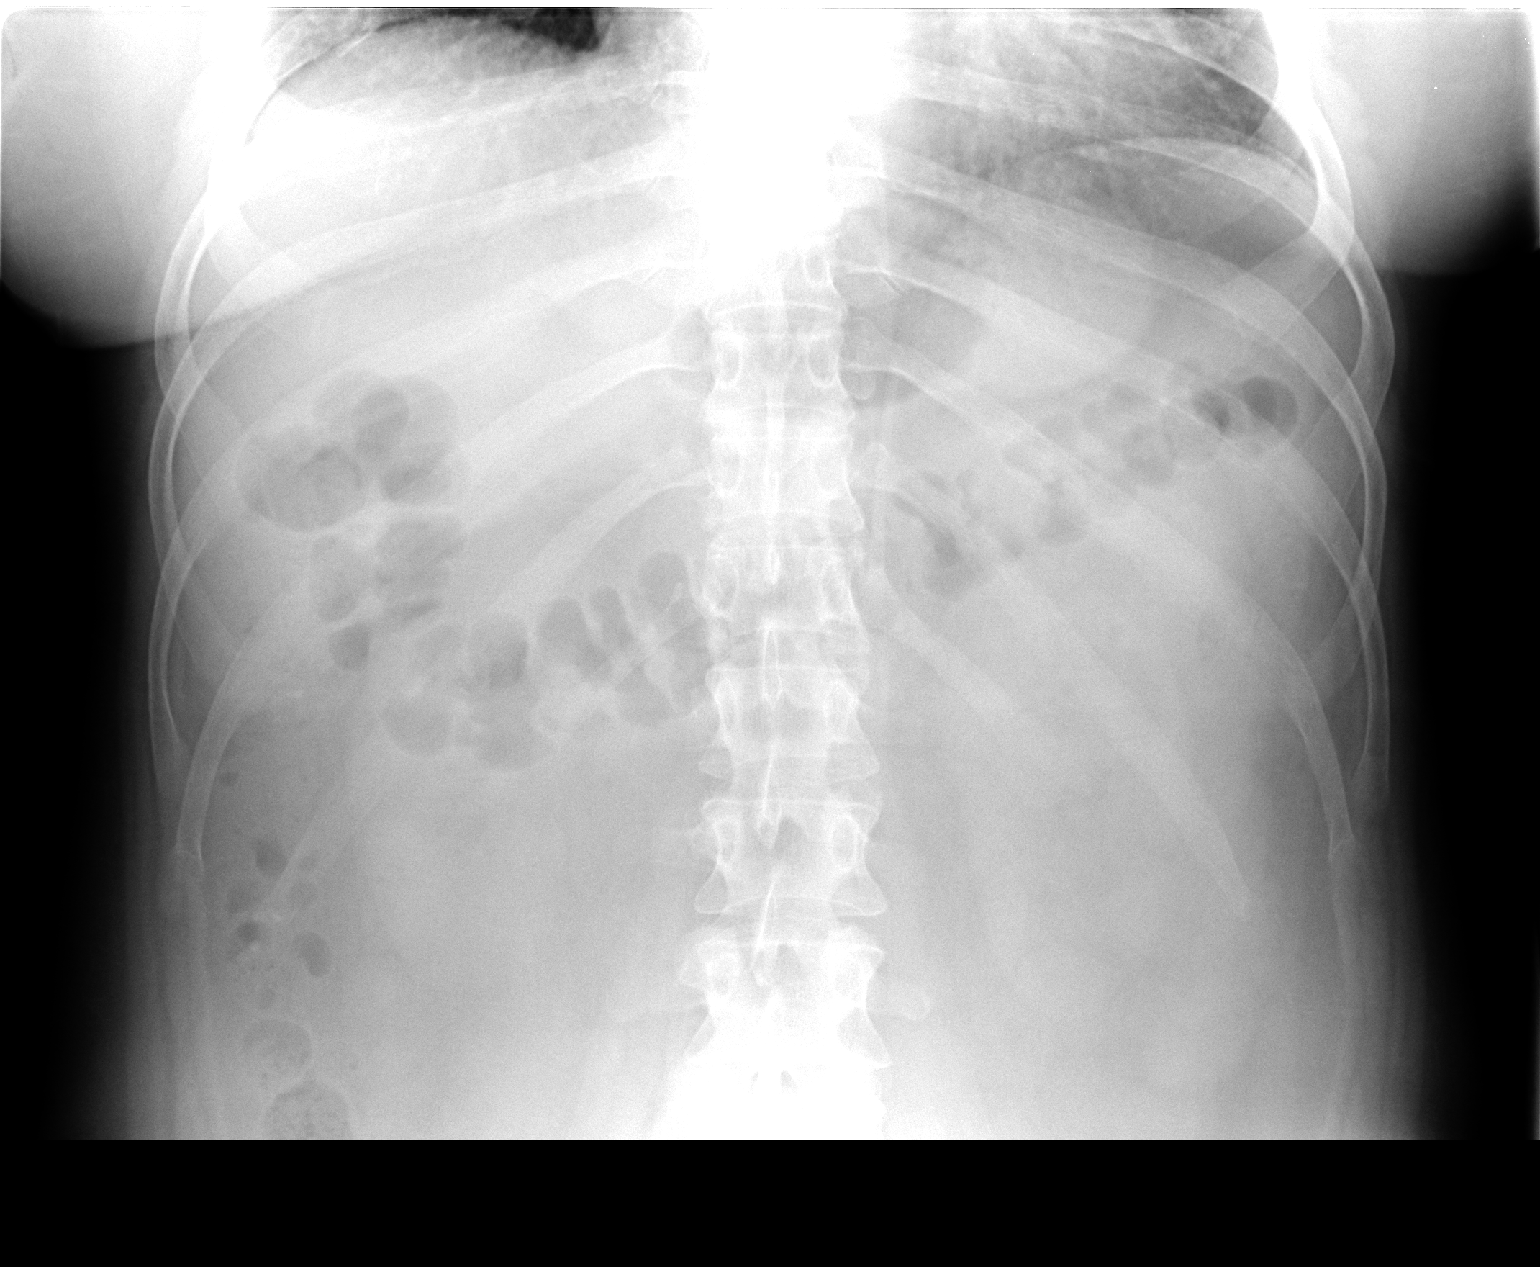

[2 of 2 positions shown; findings below may reference images not displayed]

FINDINGS: The bowel gas pattern is normal.  There is no supine
evidence of free intraperitoneal air.  No suspicious abdominal
calcifications are demonstrated.  There are bilateral tubal
ligation clips and a stable right pelvic phlebolith.
IMPRESSION: No active abdominal findings.

## 2009-11-07 IMAGING — CR DG ABDOMEN ACUTE W/ 1V CHEST
3 series · 3 of 3 positions shown · non-contrast
Comparison: 08/04/2008

CLINICAL DATA: Shortness of breath, subdiaphragmatic pain

ACUTE ABDOMEN SERIES (ABDOMEN 2 VIEW & CHEST 1 VIEW)

[w chest pa]
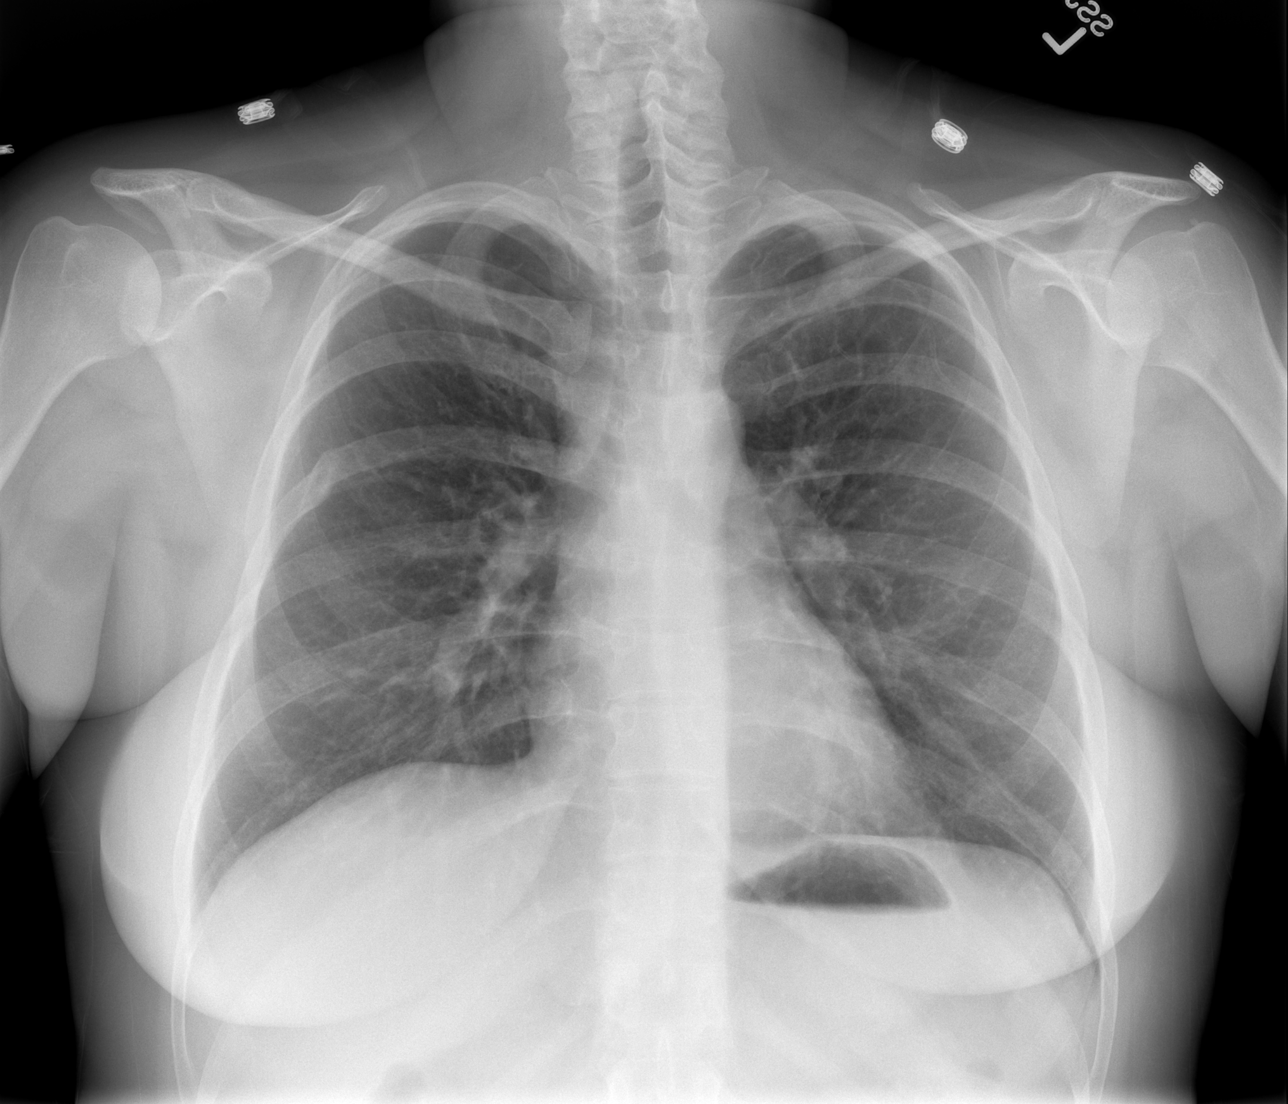

[w abdomen upright]
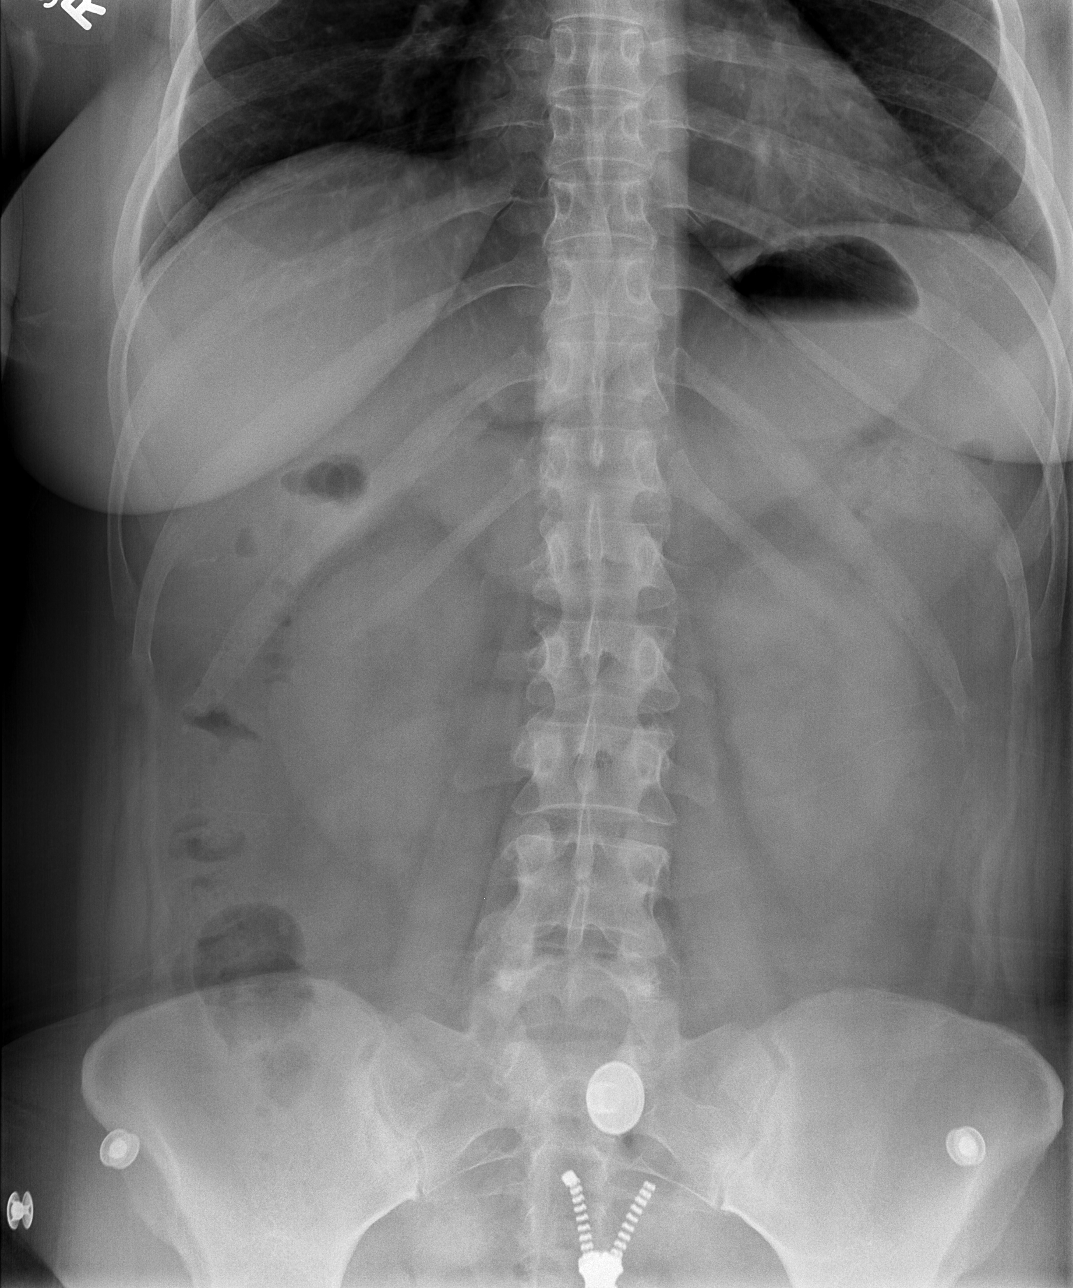

[t abdomen supine]
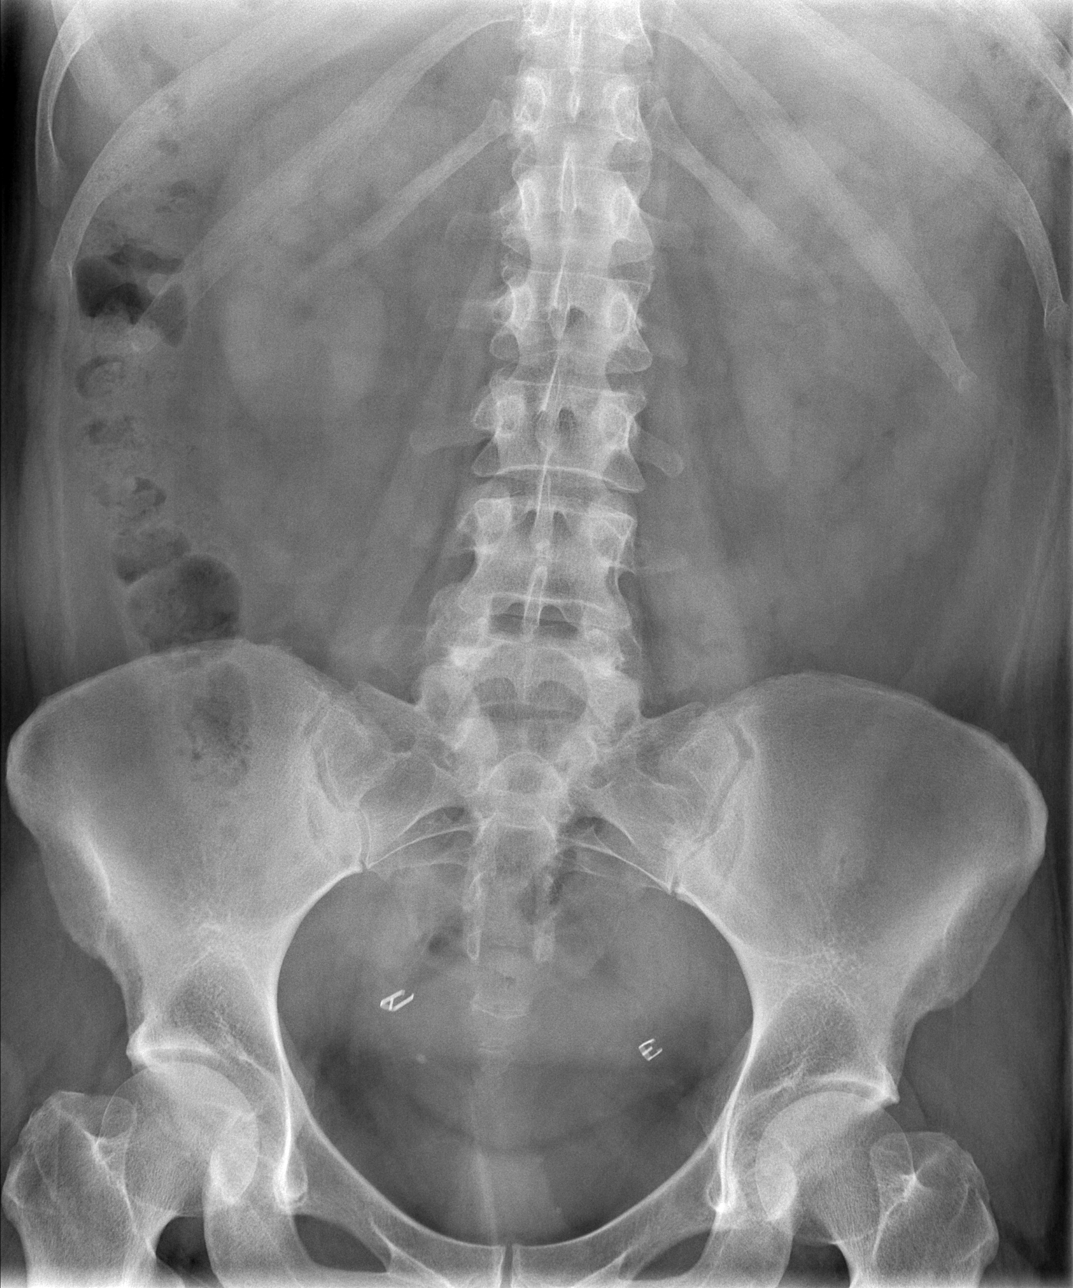

[3 of 3 positions shown; findings below may reference images not displayed]

FINDINGS: Old right sixth rib fracture.  Lungs clear.  Heart size
and pulmonary vascularity normal.  No free air on the erect film.
There is a paucity of small bowel gas.  The colon is nondistended.
Bilateral tubal ligation clips.  Stable right pelvic phlebolith.
Visualized bones unremarkable.
IMPRESSION: 1.  Nonobstructive bowel gas pattern; no free air.
2.  No acute cardiopulmonary disease.

## 2009-11-18 ENCOUNTER — Ambulatory Visit: Payer: Self-pay | Admitting: Critical Care Medicine

## 2009-11-22 ENCOUNTER — Telehealth: Payer: Self-pay | Admitting: Family Medicine

## 2009-11-23 ENCOUNTER — Encounter: Payer: Self-pay | Admitting: Family Medicine

## 2009-11-26 ENCOUNTER — Emergency Department (HOSPITAL_BASED_OUTPATIENT_CLINIC_OR_DEPARTMENT_OTHER): Admission: EM | Admit: 2009-11-26 | Discharge: 2009-11-26 | Payer: Self-pay | Admitting: Emergency Medicine

## 2009-11-26 ENCOUNTER — Ambulatory Visit: Payer: Self-pay | Admitting: Diagnostic Radiology

## 2009-12-01 ENCOUNTER — Ambulatory Visit: Payer: Self-pay | Admitting: Family Medicine

## 2009-12-01 ENCOUNTER — Telehealth: Payer: Self-pay | Admitting: Family Medicine

## 2009-12-01 DIAGNOSIS — S339XXA Sprain of unspecified parts of lumbar spine and pelvis, initial encounter: Secondary | ICD-10-CM | POA: Insufficient documentation

## 2009-12-01 DIAGNOSIS — S335XXA Sprain of ligaments of lumbar spine, initial encounter: Secondary | ICD-10-CM

## 2009-12-02 LAB — CONVERTED CEMR LAB
ALT: 10 units/L (ref 0–35)
AST: 11 units/L (ref 0–37)
Albumin: 4.2 g/dL (ref 3.5–5.2)
Alkaline Phosphatase: 49 units/L (ref 39–117)
BUN: 10 mg/dL (ref 6–23)
CO2: 22 meq/L (ref 19–32)
Calcium: 8.9 mg/dL (ref 8.4–10.5)
Chloride: 106 meq/L (ref 96–112)
Creatinine, Ser: 0.6 mg/dL (ref 0.40–1.20)
Glucose, Bld: 104 mg/dL — ABNORMAL HIGH (ref 70–99)
Potassium: 3.9 meq/L (ref 3.5–5.3)
Sodium: 138 meq/L (ref 135–145)
Total Bilirubin: 0.3 mg/dL (ref 0.3–1.2)
Total Protein: 6.6 g/dL (ref 6.0–8.3)

## 2009-12-08 ENCOUNTER — Encounter: Payer: Self-pay | Admitting: Critical Care Medicine

## 2009-12-09 IMAGING — CR DG SHOULDER 2+V*L*
4 series · 4 of 4 positions shown · non-contrast
Comparison: None

CLINICAL DATA: Pain

LEFT SHOULDER - 2+ VIEW

[w shoulder ap internal left]
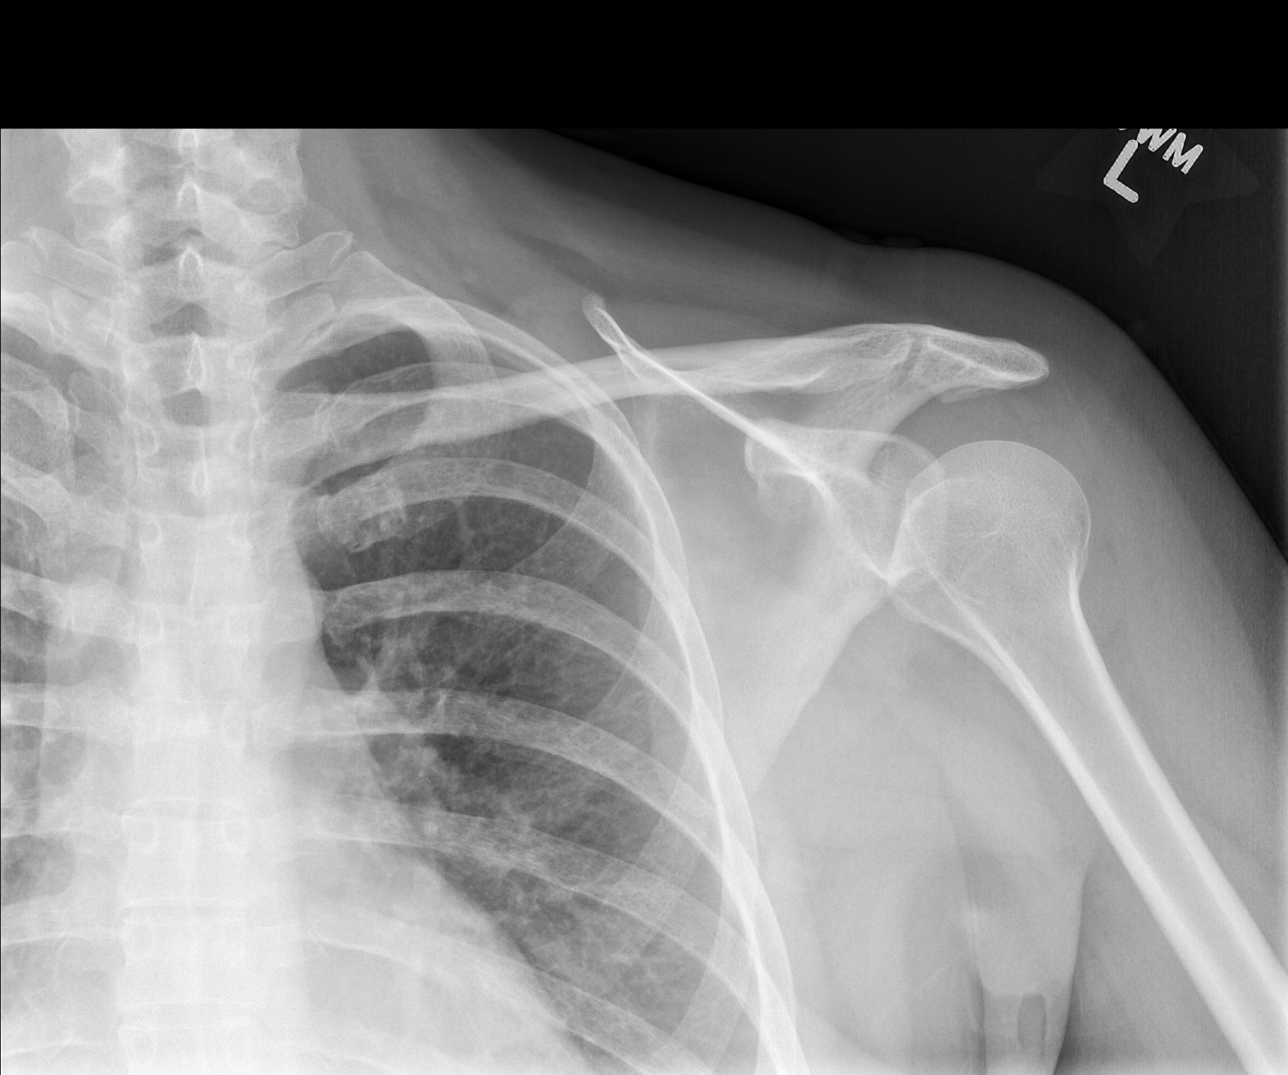

[w shoulder ap external left]
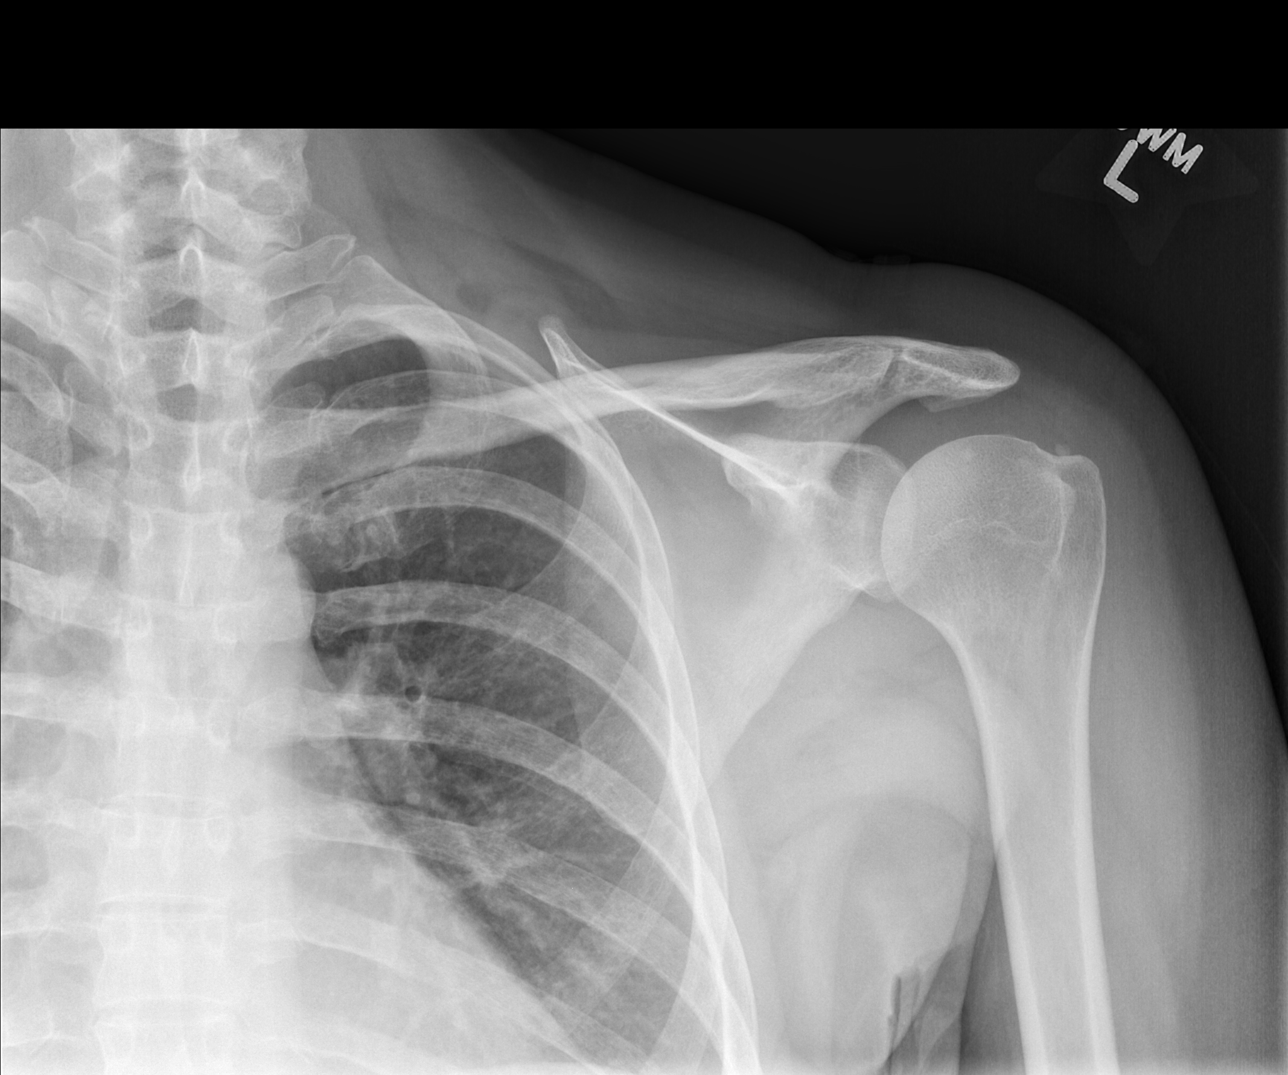

[w shoulder y view left]
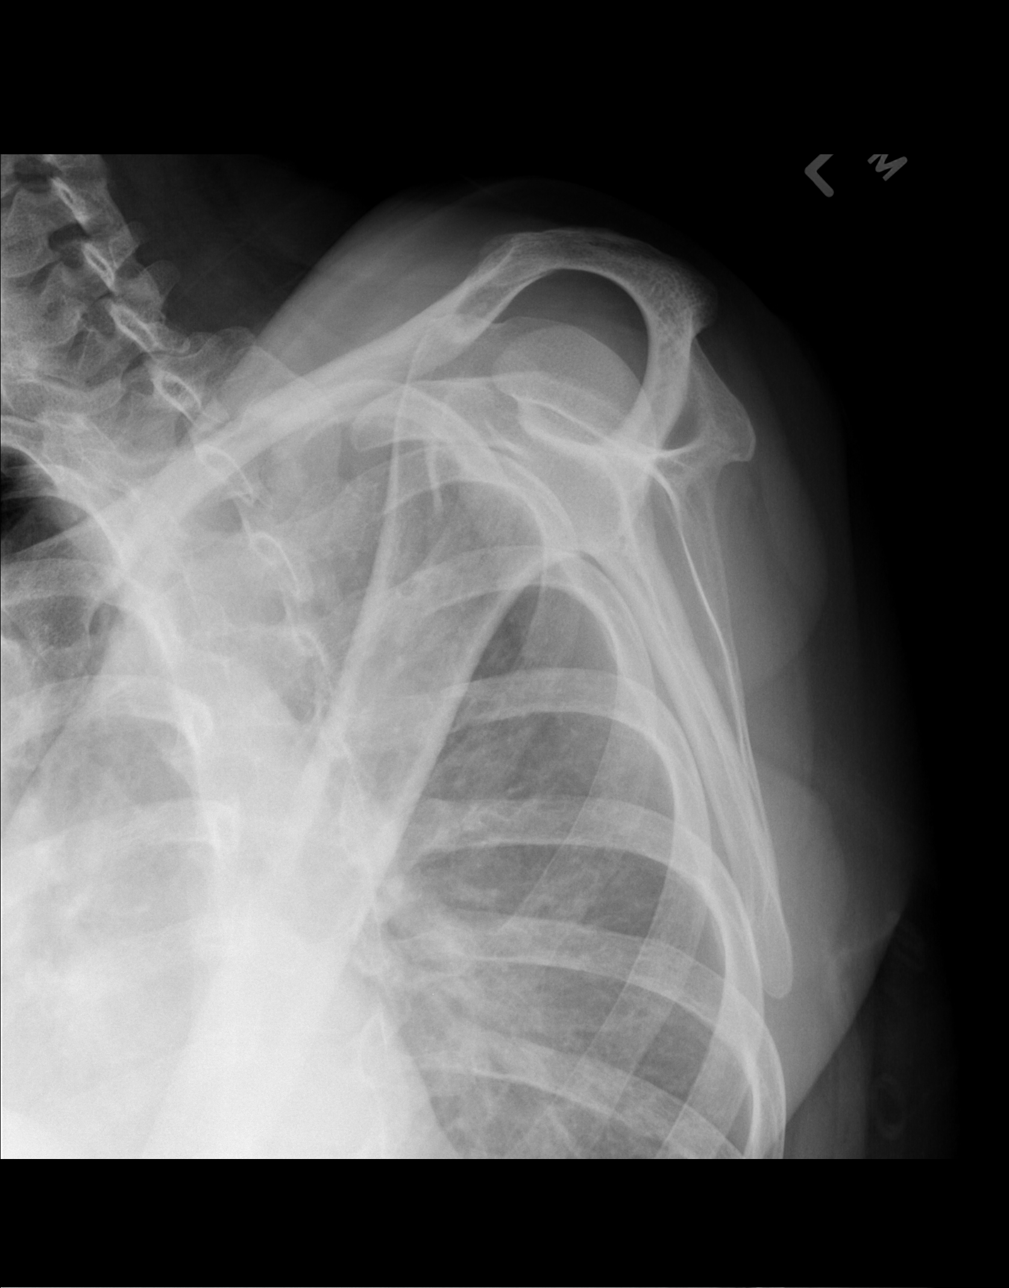

[x shoulder axillary left]
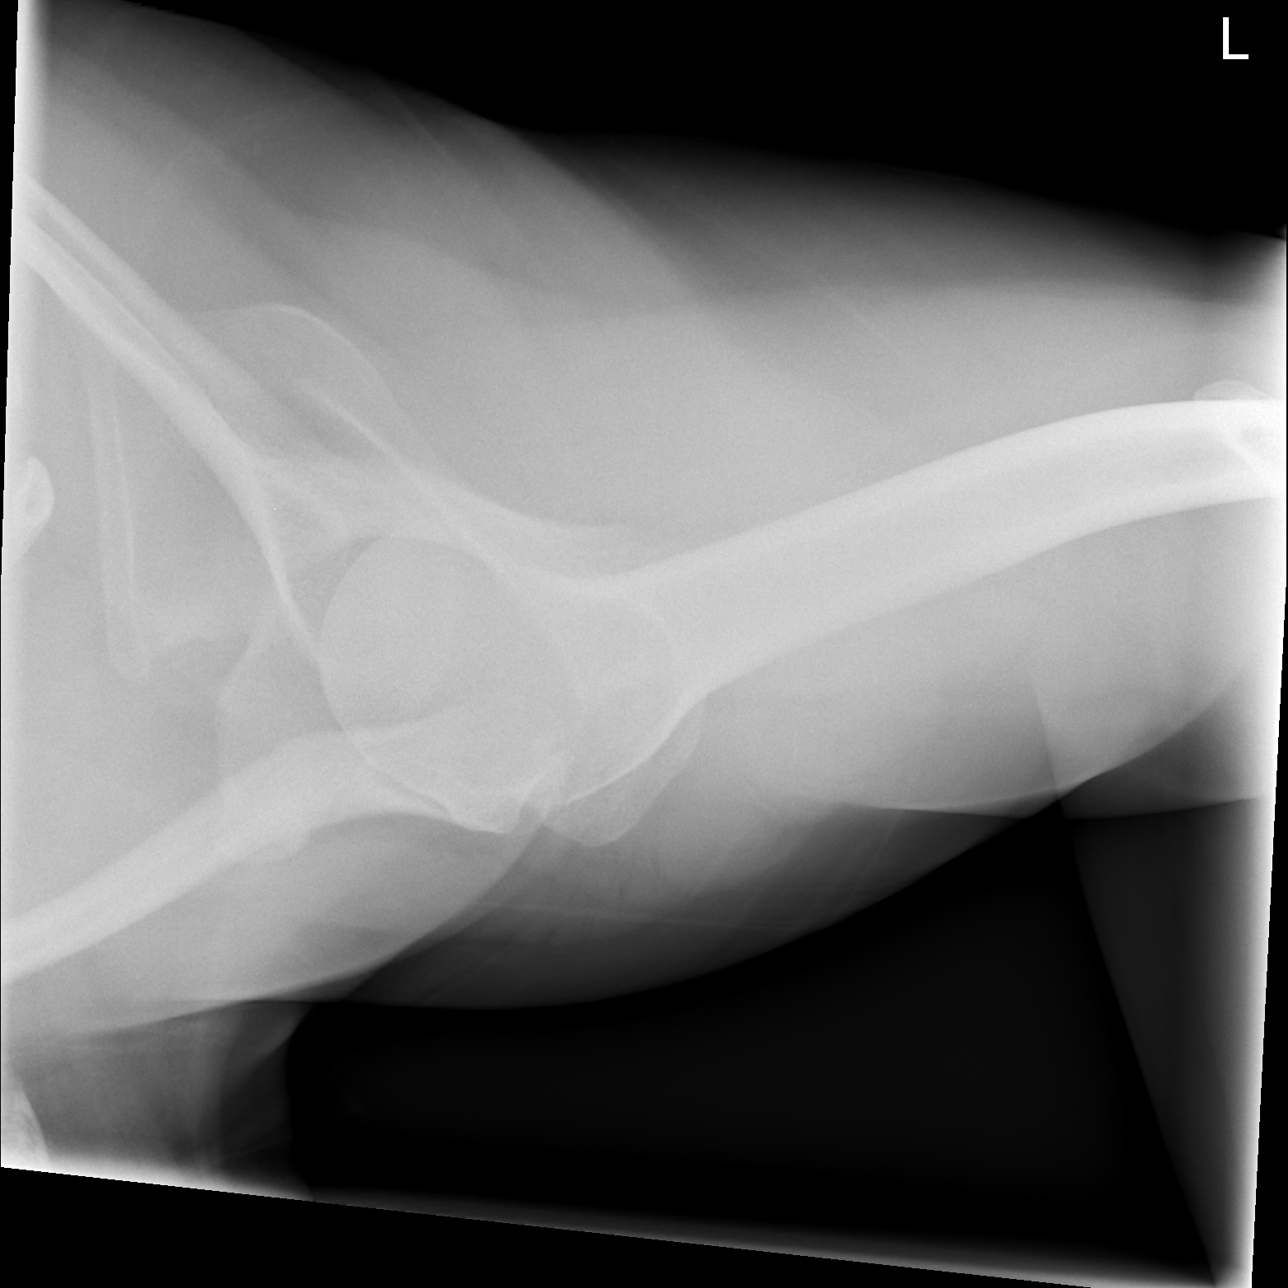

[4 of 4 positions shown; findings below may reference images not displayed]

FINDINGS: Four views provided.  No acute fracture or subluxation.
No radiopaque foreign body.
IMPRESSION: No acute fracture or subluxation.

## 2009-12-10 ENCOUNTER — Encounter: Payer: Self-pay | Admitting: Family Medicine

## 2009-12-12 IMAGING — CT CT NECK W/ CM
3 of 4 series · 16 of 33 positions shown, 19 images · IV contrast (APPLIED)
Comparison: None

CLINICAL DATA: Sore throat, chest pain

CT NECK WITH CONTRAST
TECHNIQUE: Multidetector CT imaging of the neck was performed with
intravenous contrast.Sagittal and coronal MPR images reconstructed
from axial data set.
Contrast: 100 ml 8mnipaque-1FF

[Series 3: neck 3.0 b40s · axial · 0.39mm/px · z∈[-265,-88]mm · 8 of 75 slices shown, 10 images]
[im 8/75  soft-tissue]
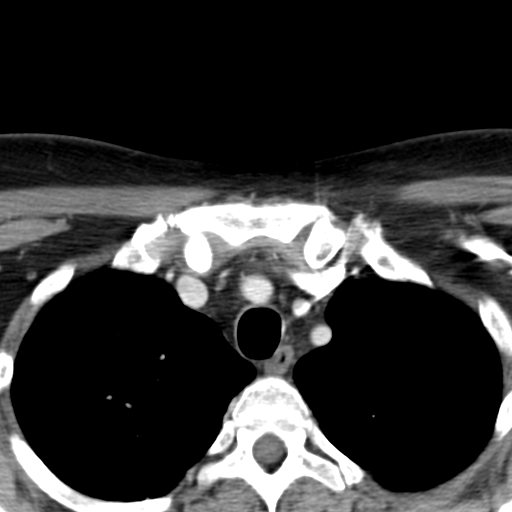
[im 8/75  bone]
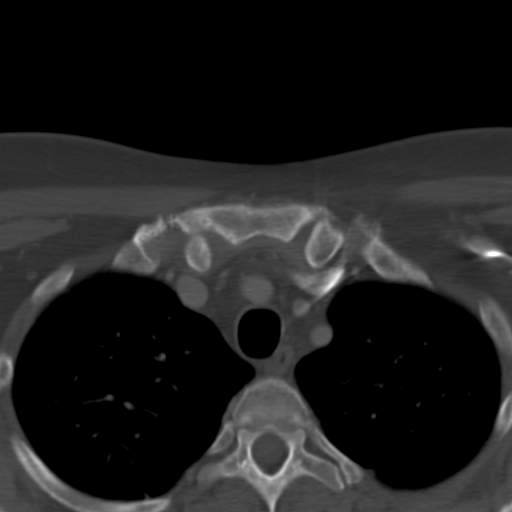
[im 15/75  bone]
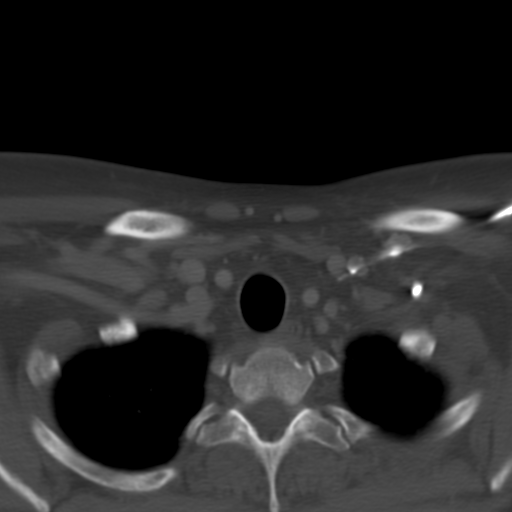
[im 23/75  bone]
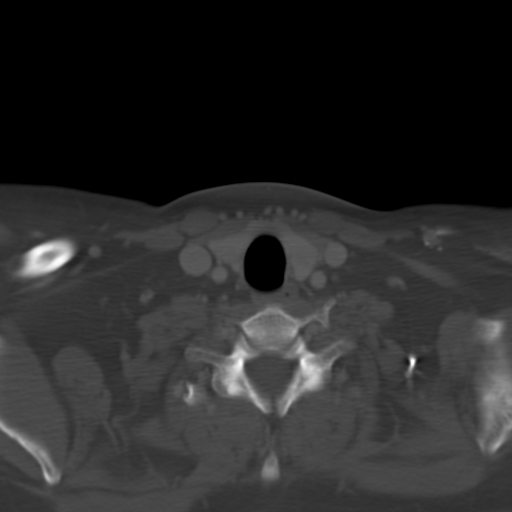
[im 30/75  bone]
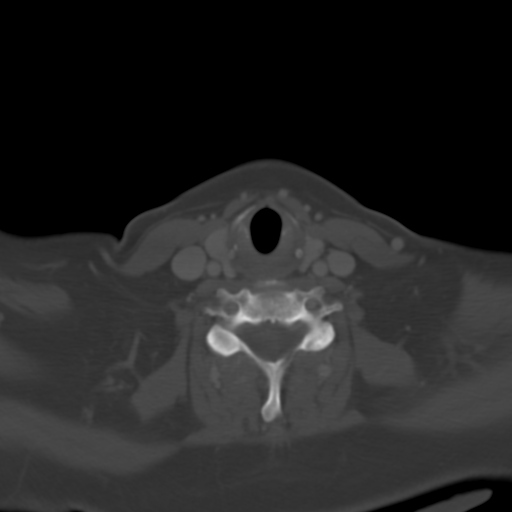
[im 45/75  soft-tissue]
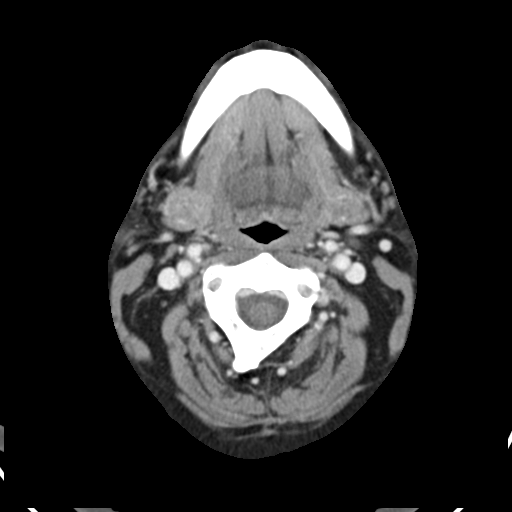
[im 45/75  bone]
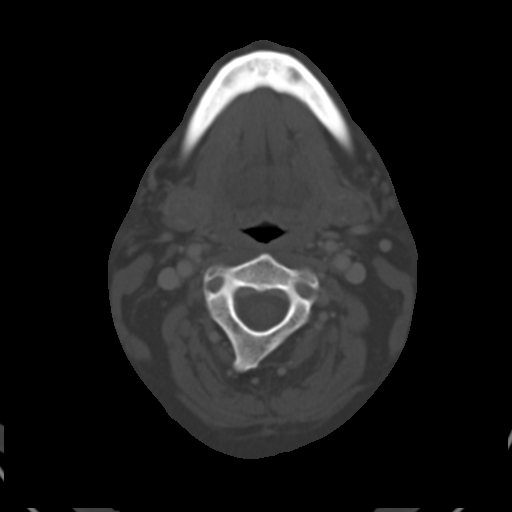
[im 52/75  bone]
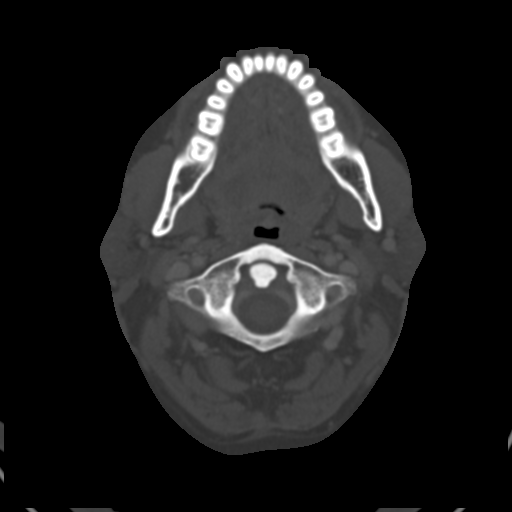
[im 60/75  bone]
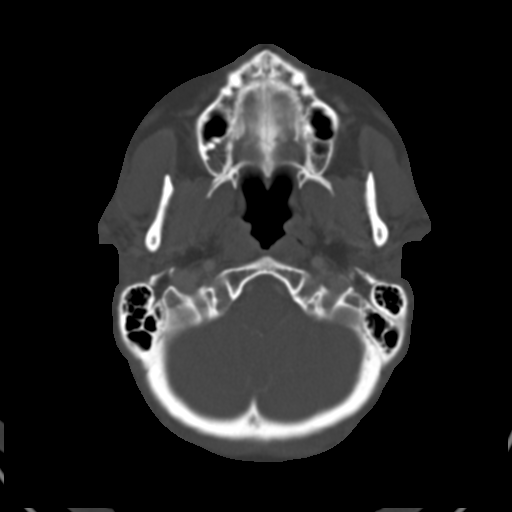
[im 67/75  bone]
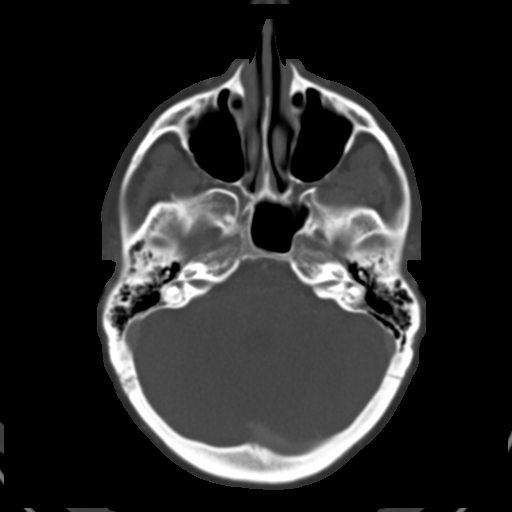

[Series 602: coronal neck · coronal · 0.44mm/px · 3 of 80 slices shown]
[im 16/80  bone]
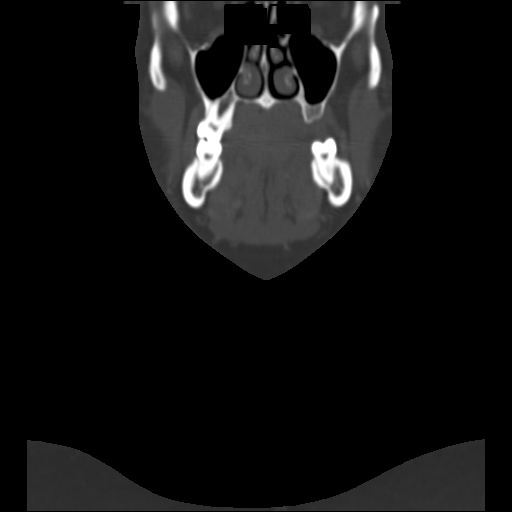
[im 32/80  bone]
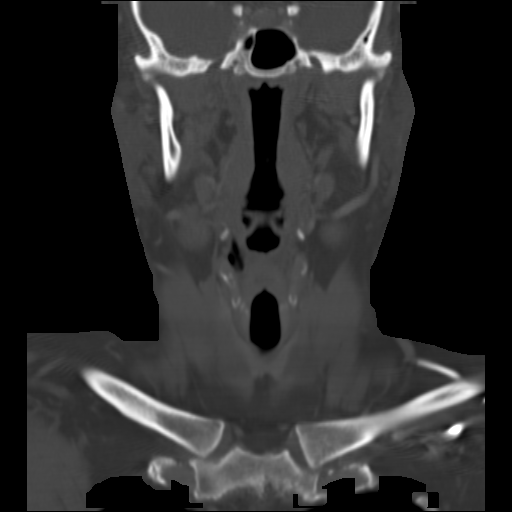
[im 48/80  bone]
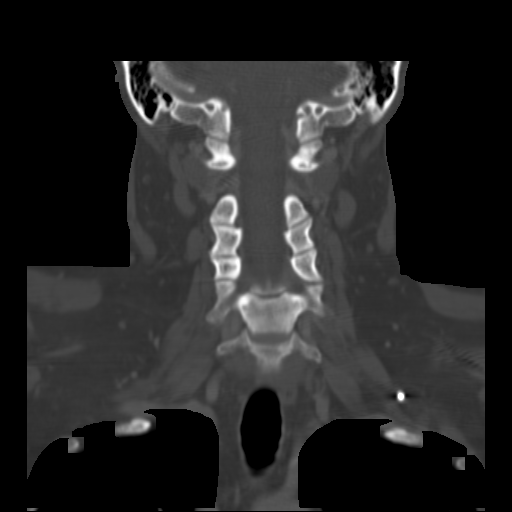

[Series 603: sagittal neck · sagittal · 0.44mm/px · 5 of 71 slices shown, 6 images]
[im 24/71  bone]
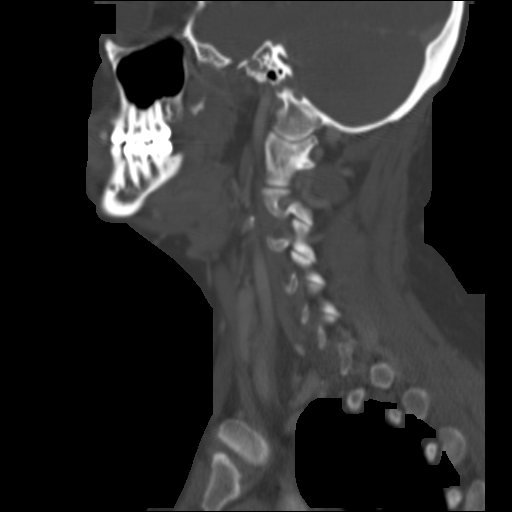
[im 30/71  bone]
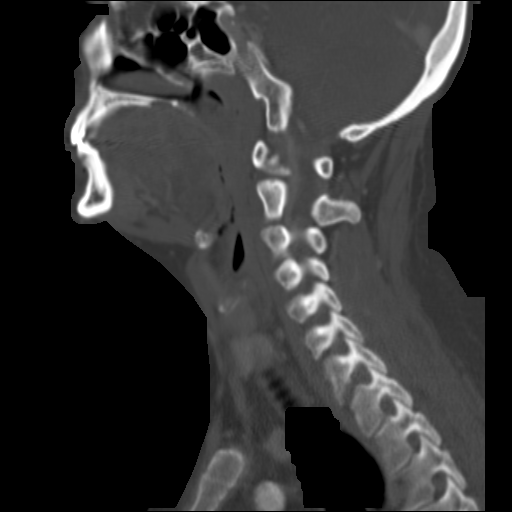
[im 36/71  soft-tissue]
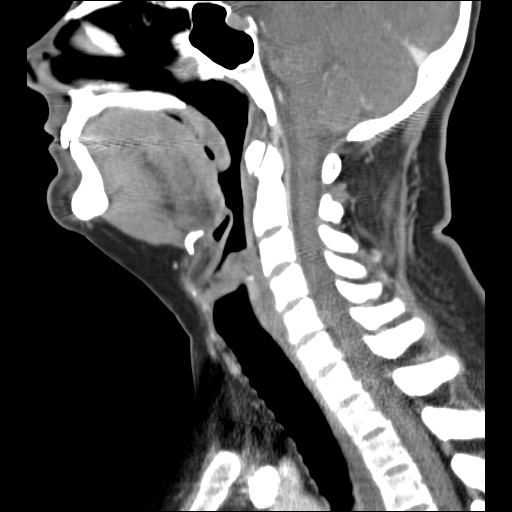
[im 36/71  bone]
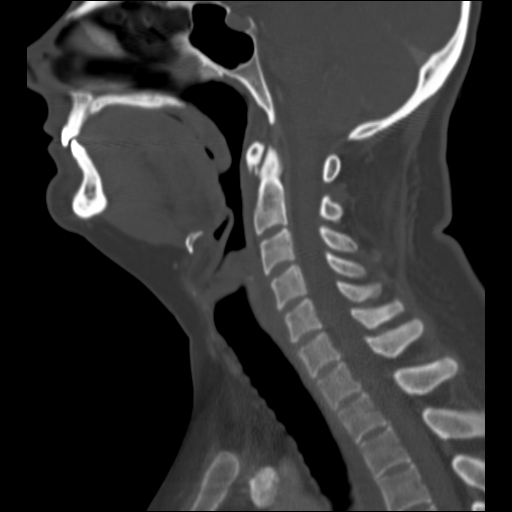
[im 41/71  bone]
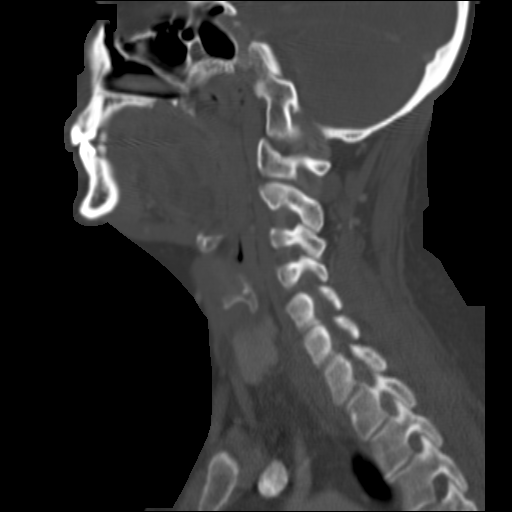
[im 47/71  bone]
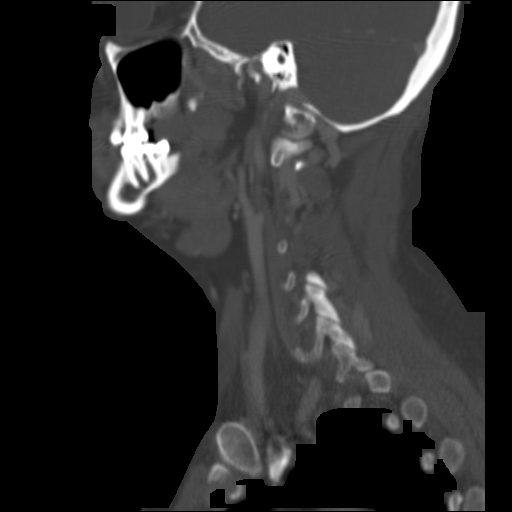

[16 of 33 positions shown; findings below may reference images not displayed]

FINDINGS: Lung apices clear.
Visualized intracranial structures unremarkable.
No focal bony abnormalities.
Symmetric appearing parotid, submandibular, and thyroid glands.
Prevertebral soft tissues normal thickness.
No abnormal gas or fluid collection.
No regional edema or focal inflammatory process.
Scattered normal-sized cervical lymph nodes.
Visualized sinuses clear.
IMPRESSION: Negative CT neck.

## 2009-12-26 ENCOUNTER — Emergency Department (HOSPITAL_BASED_OUTPATIENT_CLINIC_OR_DEPARTMENT_OTHER): Admission: EM | Admit: 2009-12-26 | Discharge: 2009-12-27 | Payer: Self-pay | Admitting: Emergency Medicine

## 2009-12-26 IMAGING — CR DG CHEST 2V
2 series · 2 of 2 positions shown · non-contrast
Comparison: 05/19/2008.

CLINICAL DATA: Cough, shortness of breath and chest congestion.  Ex-
smoker.

CHEST - 2 VIEW

[w chest pa]
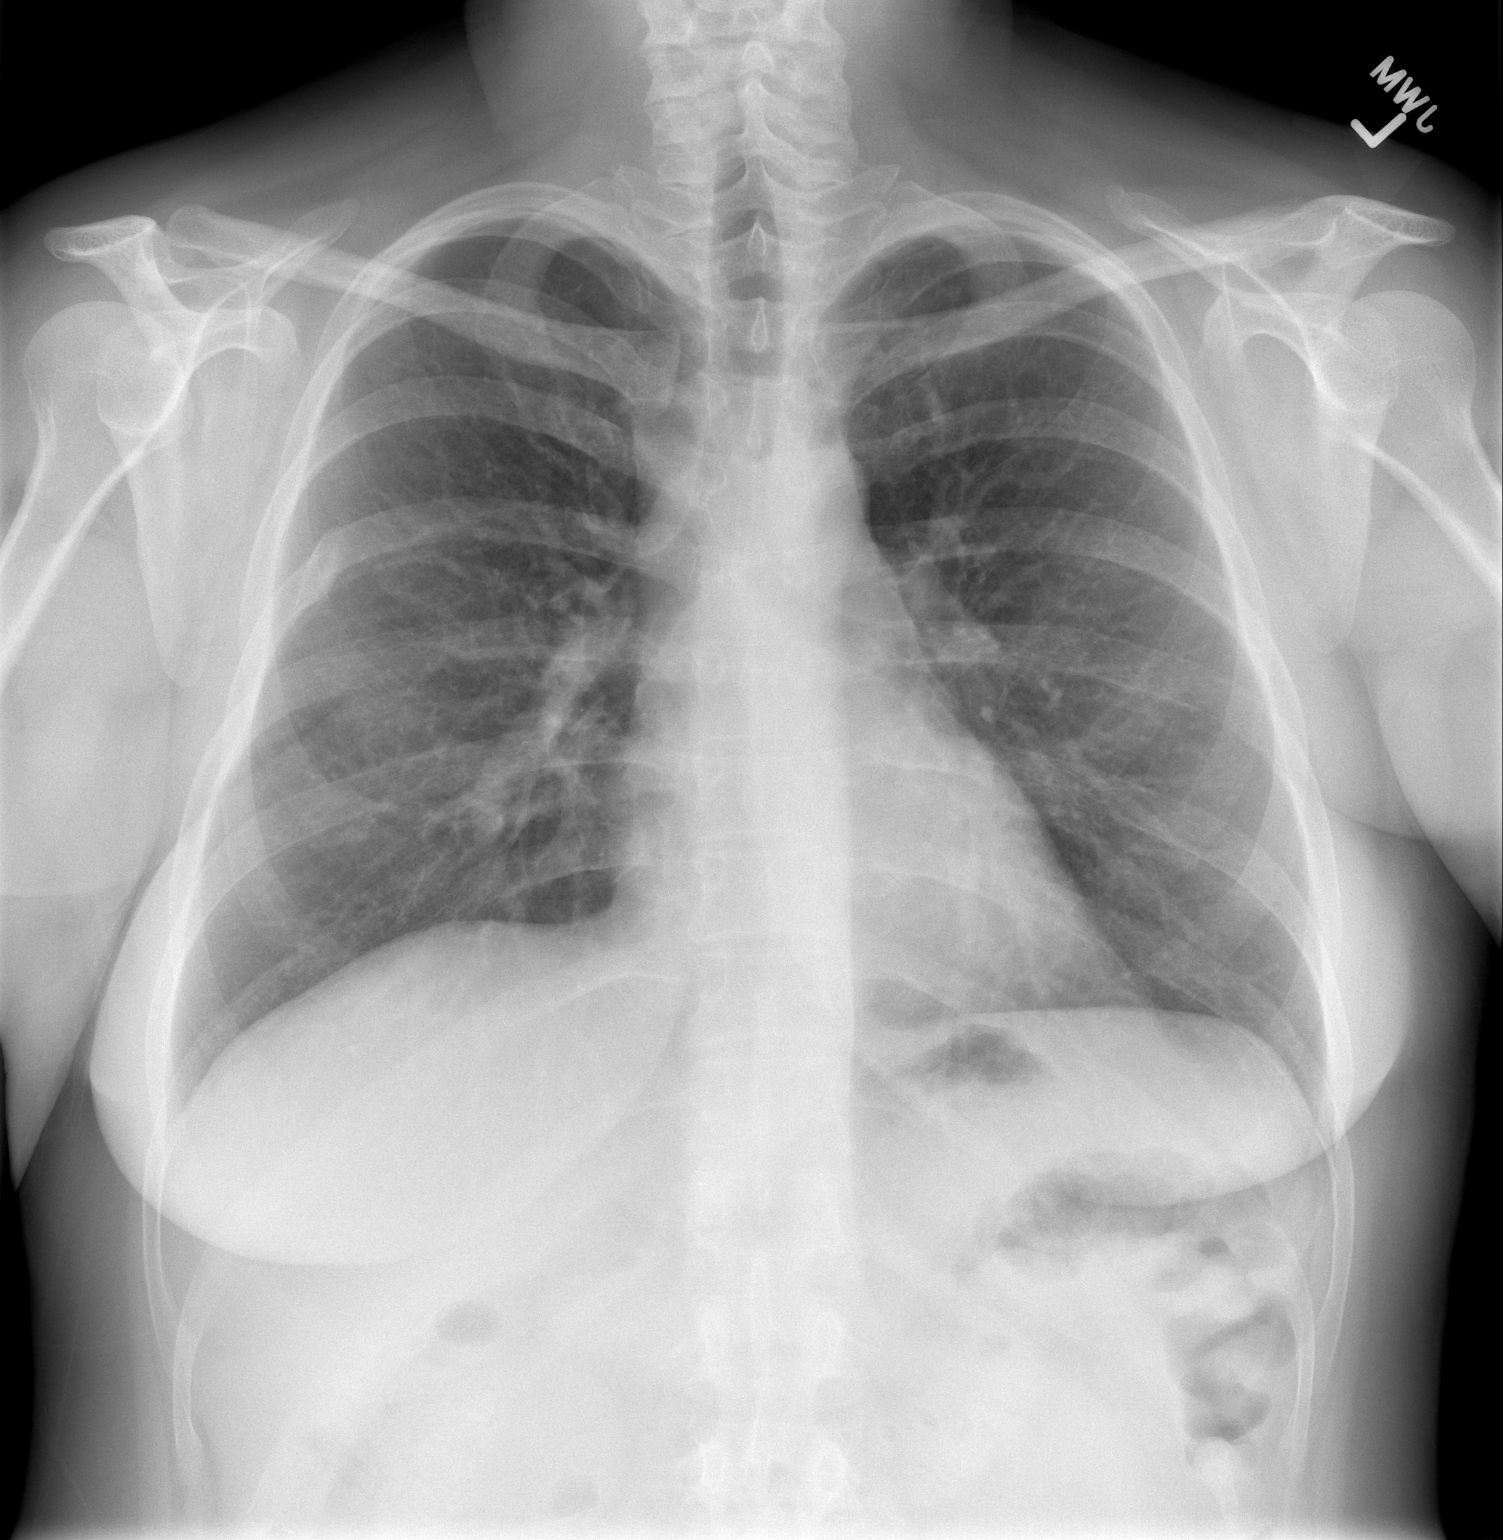

[w chest lat]
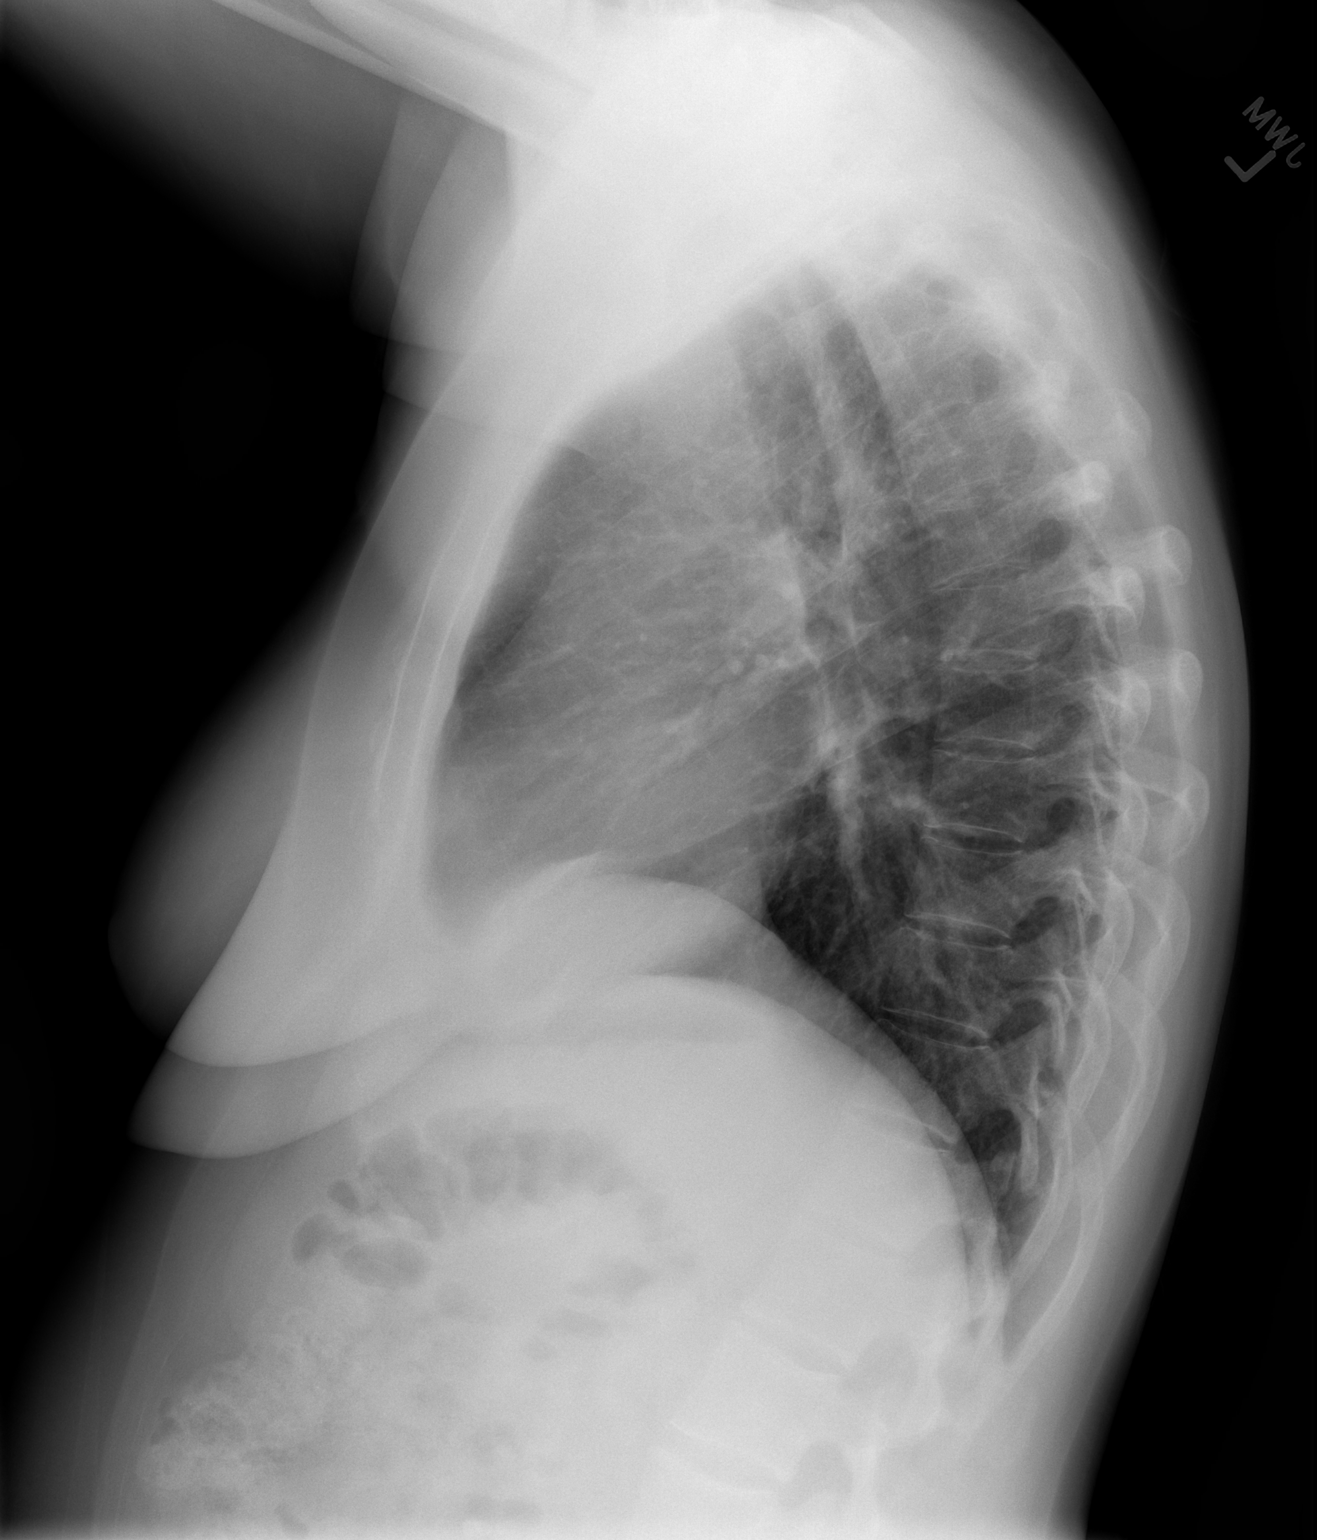

[2 of 2 positions shown; findings below may reference images not displayed]

FINDINGS: The heart remains normal in size.  The lungs remain
clear.  Stable old, healed right sixth rib fracture.
IMPRESSION: No acute abnormality.

## 2009-12-26 IMAGING — CR DG NECK SOFT TISSUE
1 series · 1 of 1 positions shown · non-contrast
Comparison: Neck CT dated 09/18/2008.

CLINICAL DATA: Cough, chest congestion and shortness of breath.
The patient has a sensation of phlegm in the throat which she is
unable to clear.

NECK SOFT TISSUES - 1+ VIEW

[w soft tissue neck]
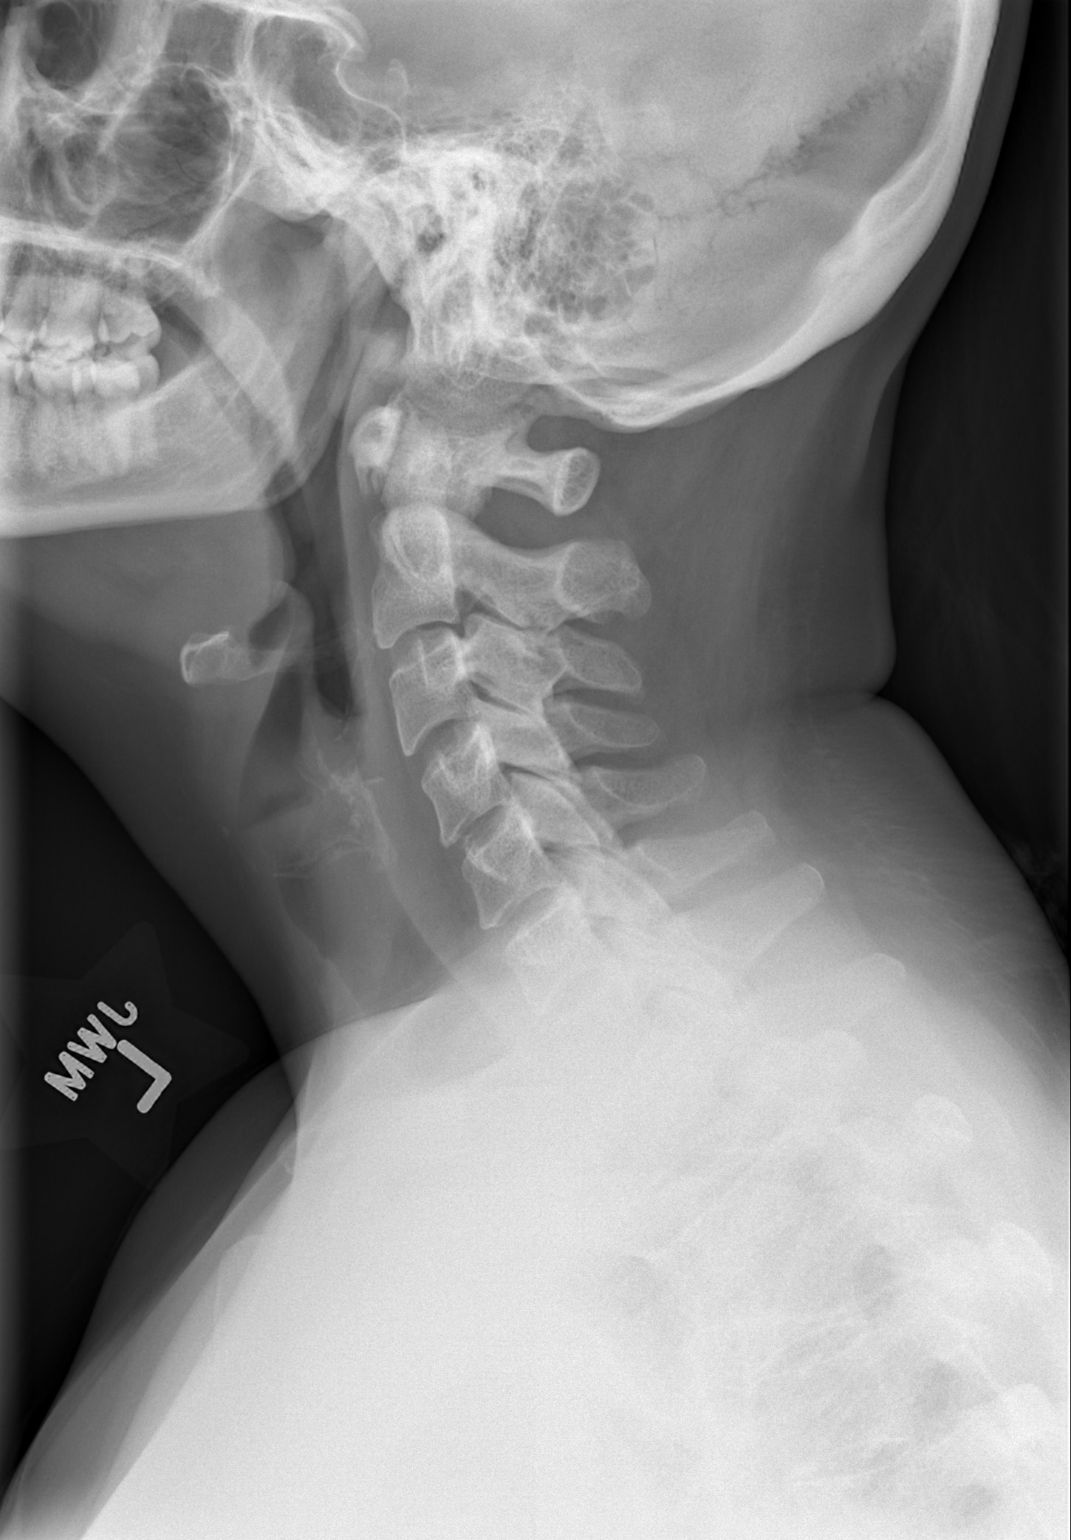

[1 of 1 positions shown; findings below may reference images not displayed]

FINDINGS: The bones and soft tissues of the neck have normal
appearances.  Normal appearing airway.  No radiopaque foreign
bodies are seen.
IMPRESSION: Normal examination.

## 2010-01-02 IMAGING — CT CT ABDOMEN W/ CM
2 of 5 series · 17 of 46 positions shown, 19 images · IV contrast (APPLIED)
Comparison: Multiple prior CT abdomen and pelvis examinations, most
recently 05/28/2008.

CLINICAL DATA: Abdominal pain.  Hematemesis.  EGD yesterday.
Surgical history includes appendectomy.

CT ABDOMEN AND PELVIS WITH CONTRAST  10/09/2008:
TECHNIQUE: Multidetector CT imaging of the abdomen and pelvis was
performed using the standard protocol following bolus
administration of intravenous contrast.
Contrast: 100 ml Xmnipaque-599 IV.  The patient did not wish to
drink the oral contrast.

[Series 2: abd/pelv with 5.0 b31f st · axial · 0.76mm/px · z∈[-412,+14]mm · 14 of 95 slices shown, 16 images]
[im 5/95  soft-tissue]
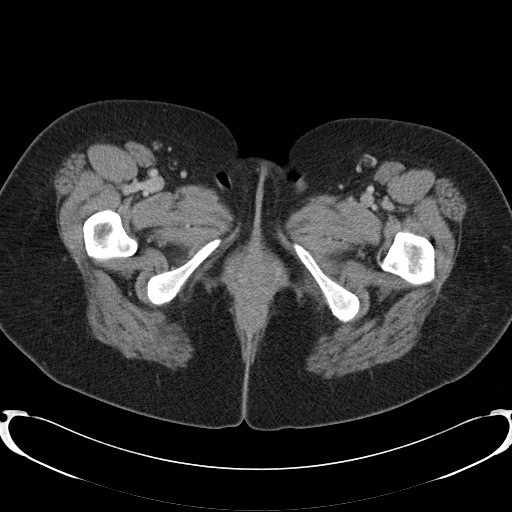
[im 5/95  bone]
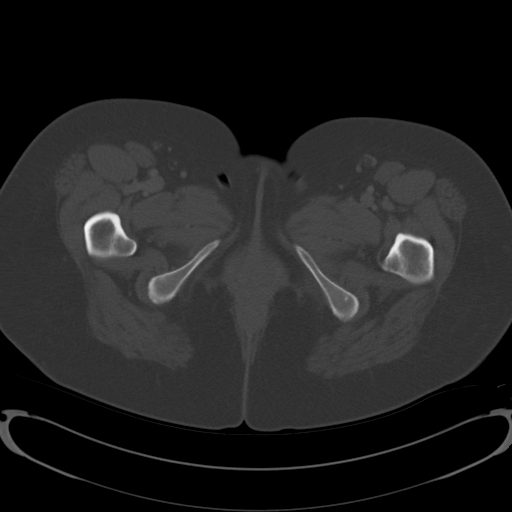
[im 15/95  soft-tissue]
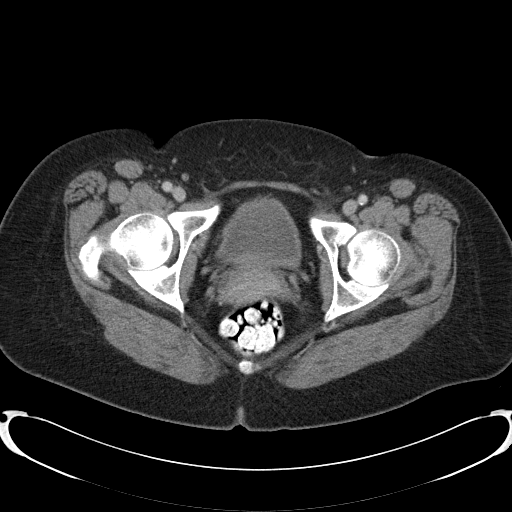
[im 19/95  soft-tissue]
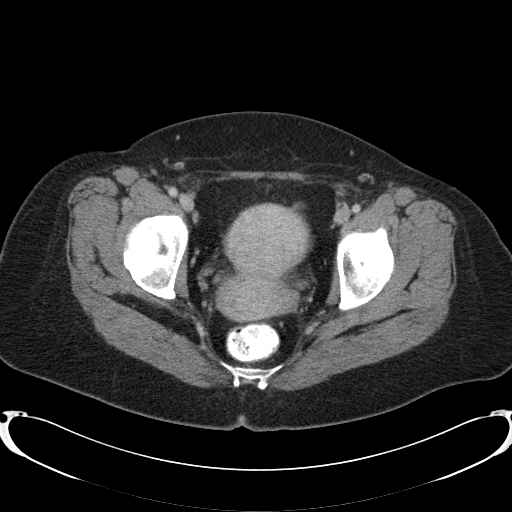
[im 24/95  soft-tissue]
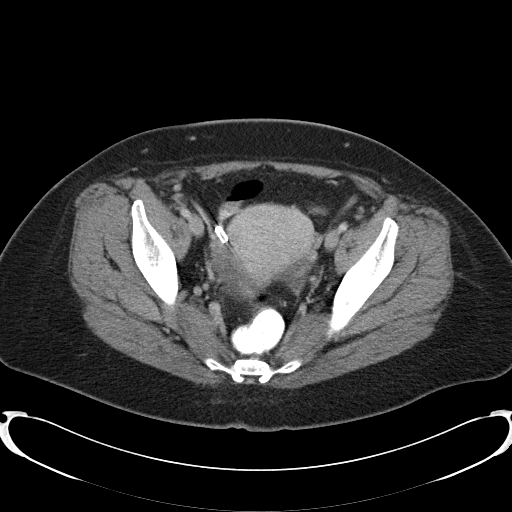
[im 33/95  soft-tissue]
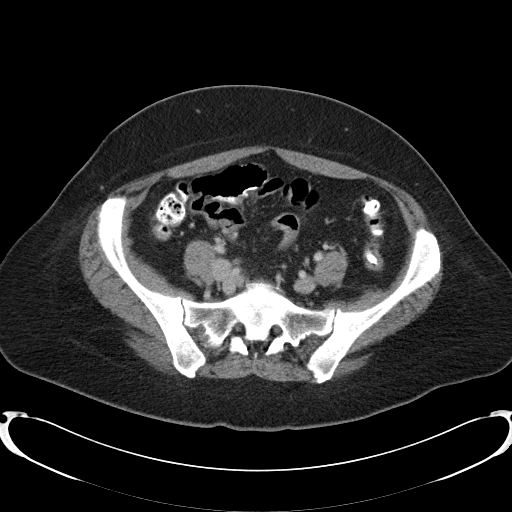
[im 38/95  soft-tissue]
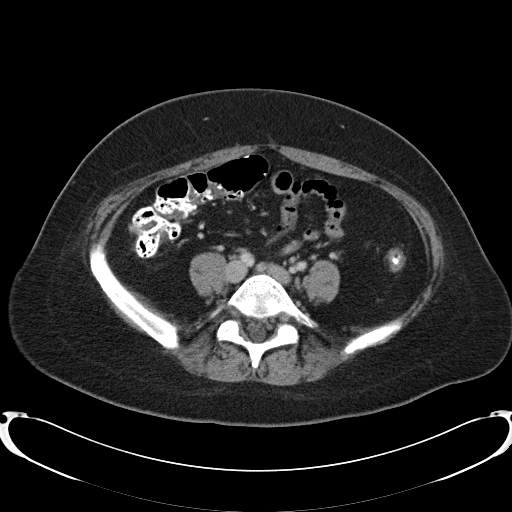
[im 43/95  soft-tissue]
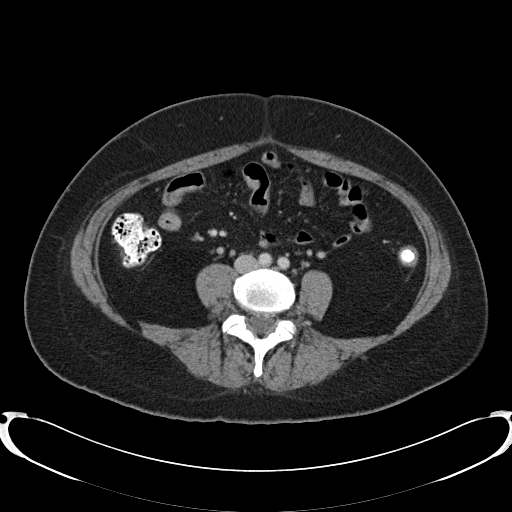
[im 52/95  soft-tissue]
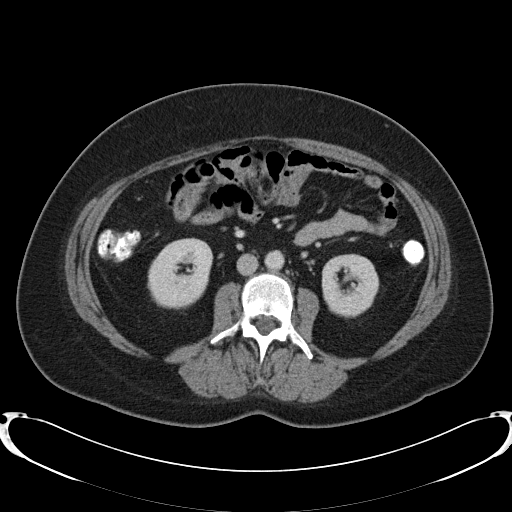
[im 57/95  soft-tissue]
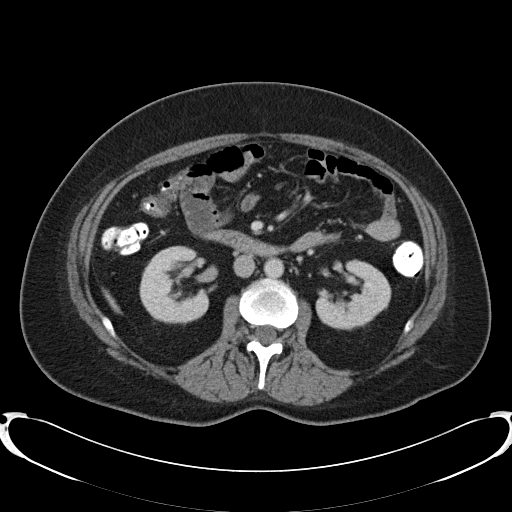
[im 57/95  bone]
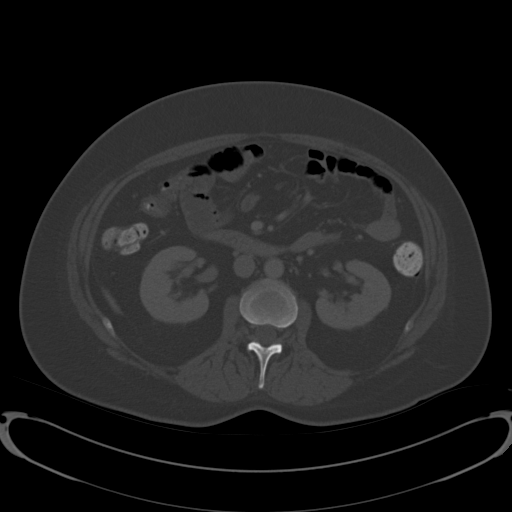
[im 62/95  soft-tissue]
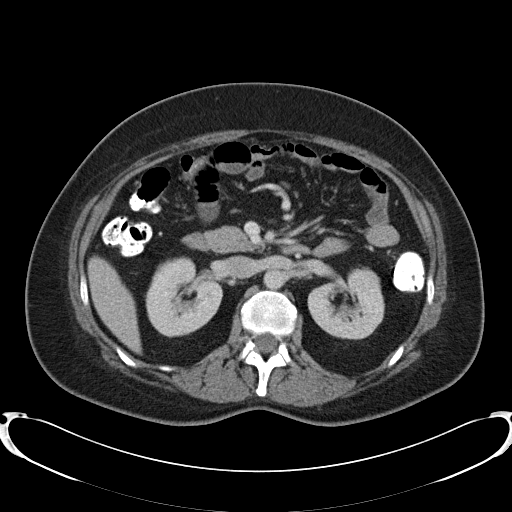
[im 71/95  soft-tissue]
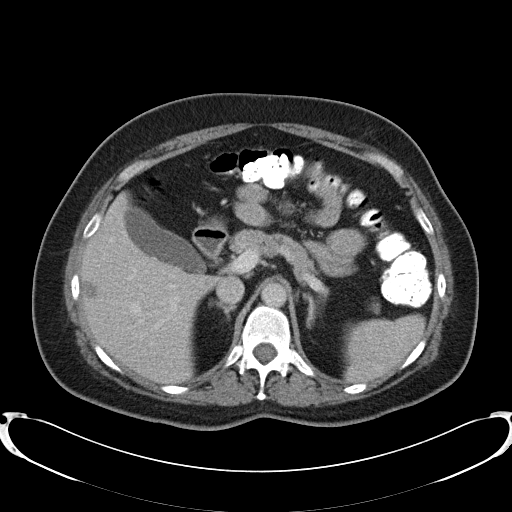
[im 76/95  soft-tissue]
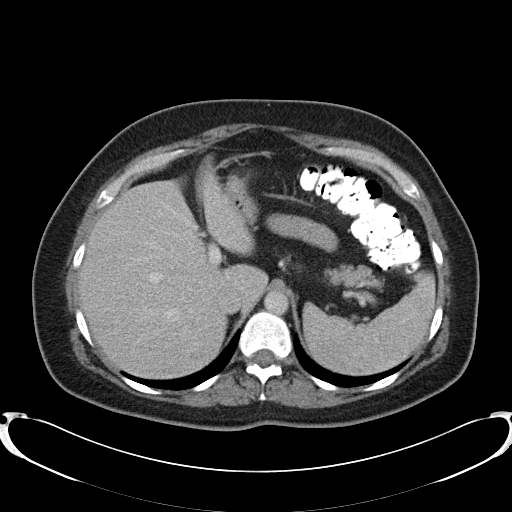
[im 80/95  soft-tissue]
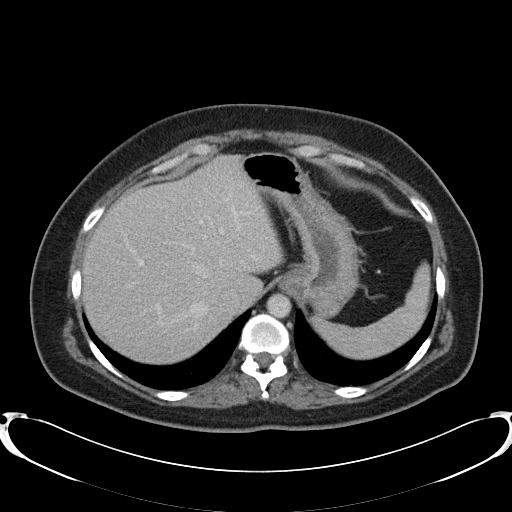
[im 90/95  soft-tissue]
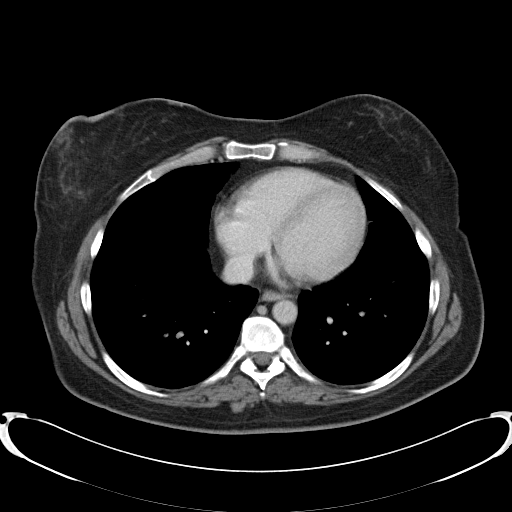

[Series 602: coronals · coronal · 0.92mm/px · 3 of 107 slices shown]
[im 36/107  soft-tissue]
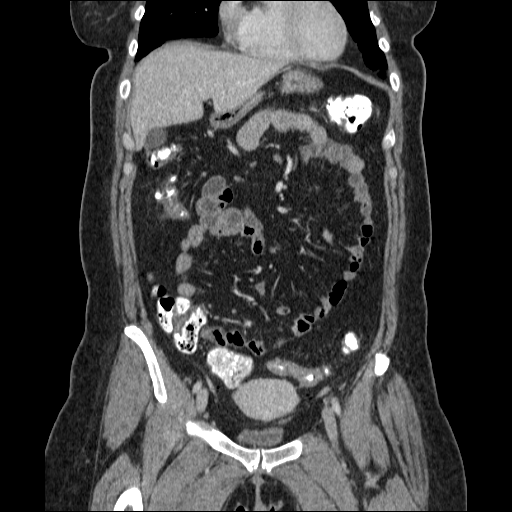
[im 48/107  soft-tissue]
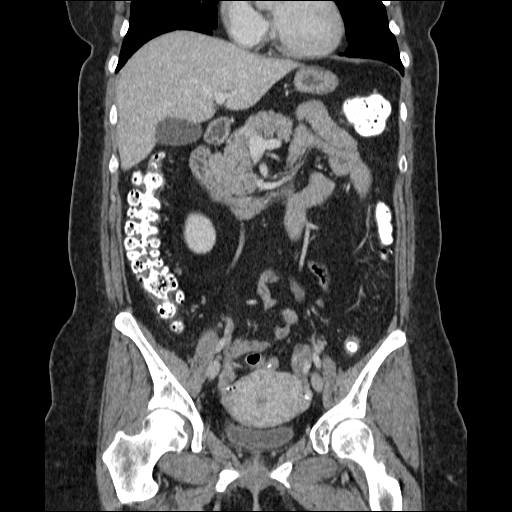
[im 59/107  soft-tissue]
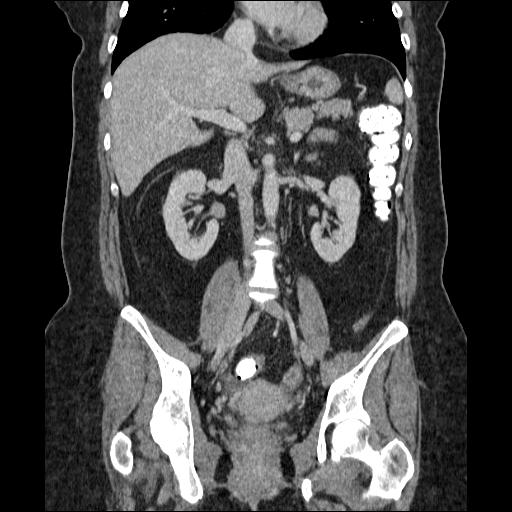

[17 of 46 positions shown; findings below may reference images not displayed]

For the purposes of keeping up with lifetime
radiation dose, this is the 4th CT of the abdomen and pelvis since
August 2007, in [REDACTED].

CT ABDOMEN
FINDINGS: Stable approximate 1.8 cm peripheral hemangioma in the
posterior segment right lobe (series 2, image 24); no new or
suspicious hepatic abnormalities.  Normal appearing spleen,
pancreas, adrenal glands, stomach, and visualized small bowel and
colon.  Gallbladder unremarkable by CT.  No biliary ductal
dilation.  Minimal distal abdominal aortic atherosclerosis.  No
significant lymphadenopathy.  No ascites.  Visualized lung bases
clear.  Bone window images unremarkable.
IMPRESSION: 1.  No acute abnormalities in the abdomen.
2.  Stable approximate 1.8 cm right lobe liver hemangioma.

CT PELVIS
FINDINGS: Apparent thickening of the wall the sigmoid colon due to
the fact that this segment is decompressed.  No pericolonic
inflammation.  Visualized small bowel unremarkable.  Bilateral
tubal ligation clips.  Uterus and adnexa unremarkable for age.
Small Nabothian cysts again noted.  Urinary bladder decompressed
and unremarkable.  No significant lymphadenopathy.  No free fluid.
Visualized small bowel unremarkable.  Bone window images
unremarkable.
IMPRESSION: 1.  No acute abnormalities in the pelvis.

## 2010-01-06 ENCOUNTER — Telehealth: Payer: Self-pay | Admitting: Family Medicine

## 2010-01-19 IMAGING — CR DG CHEST 2V
2 series · 2 of 2 positions shown · non-contrast
Comparison: 10/02/2008

CLINICAL DATA: Atypical chest pain.

CHEST - 2 VIEW

[view not recorded (1 of 2)]
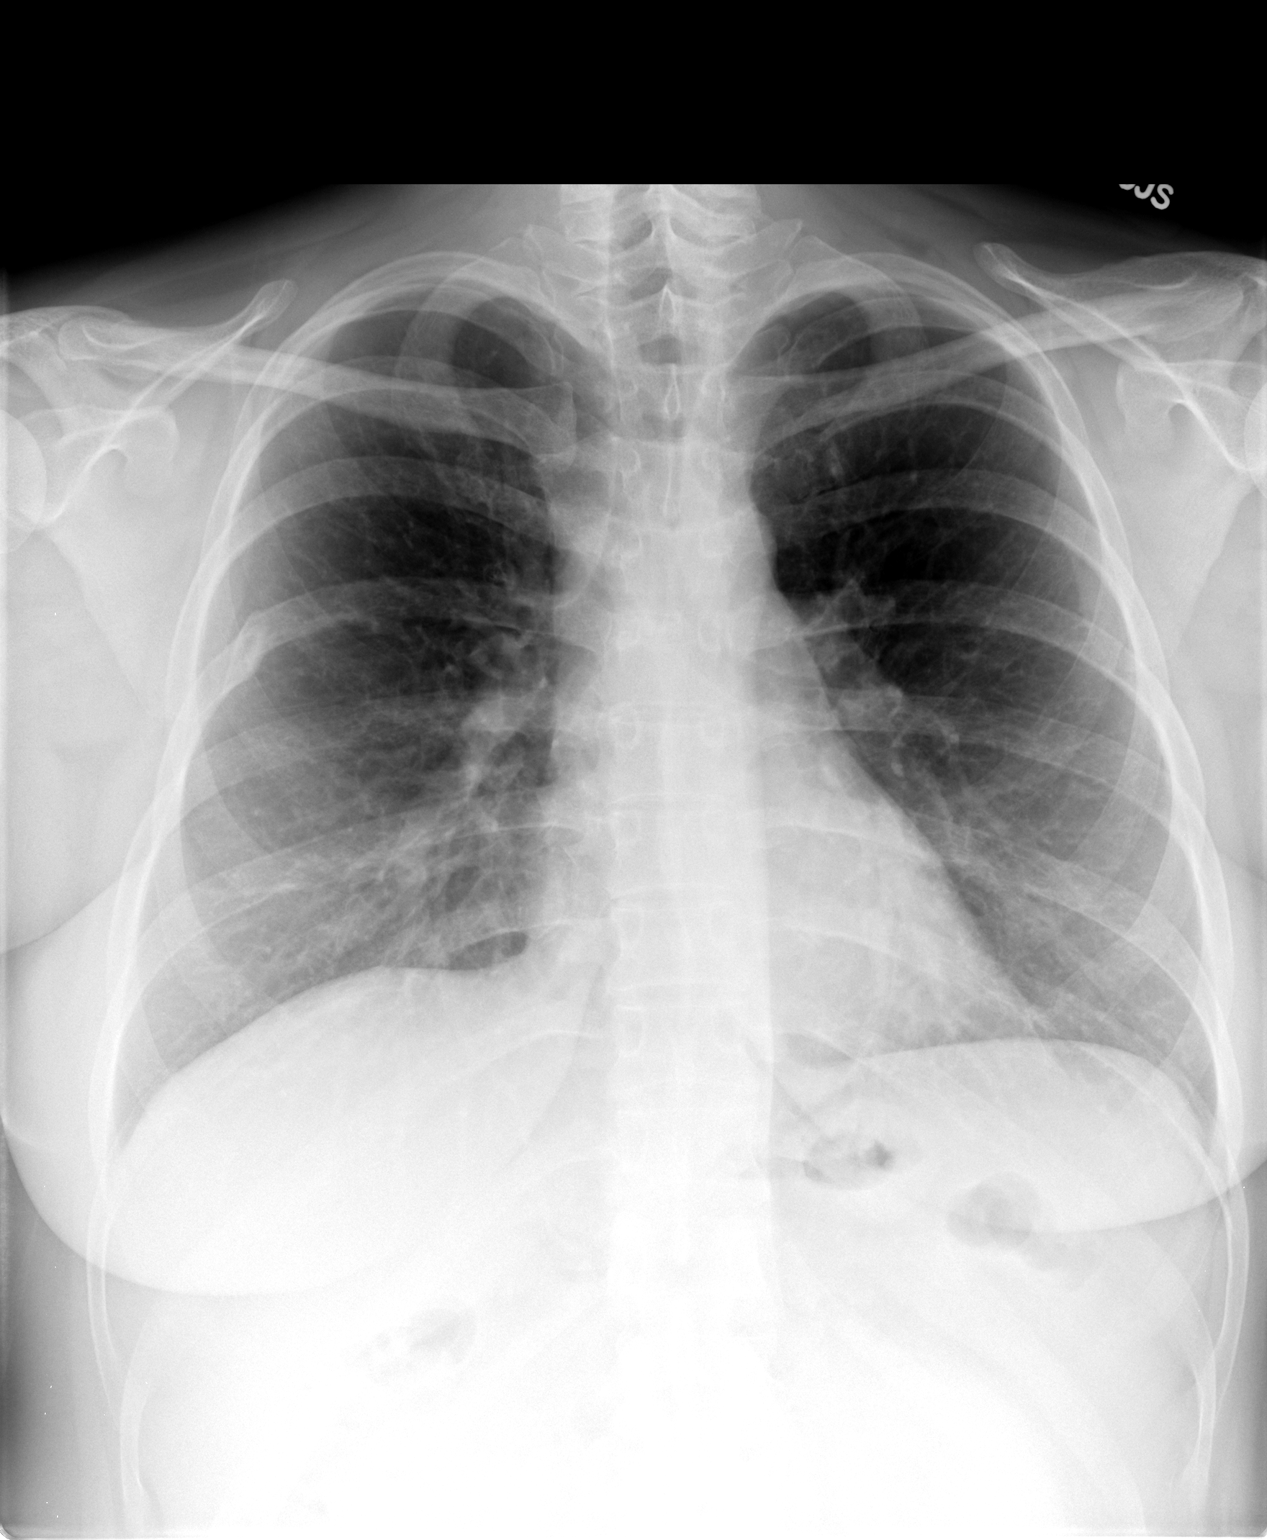

[view not recorded (2 of 2)]
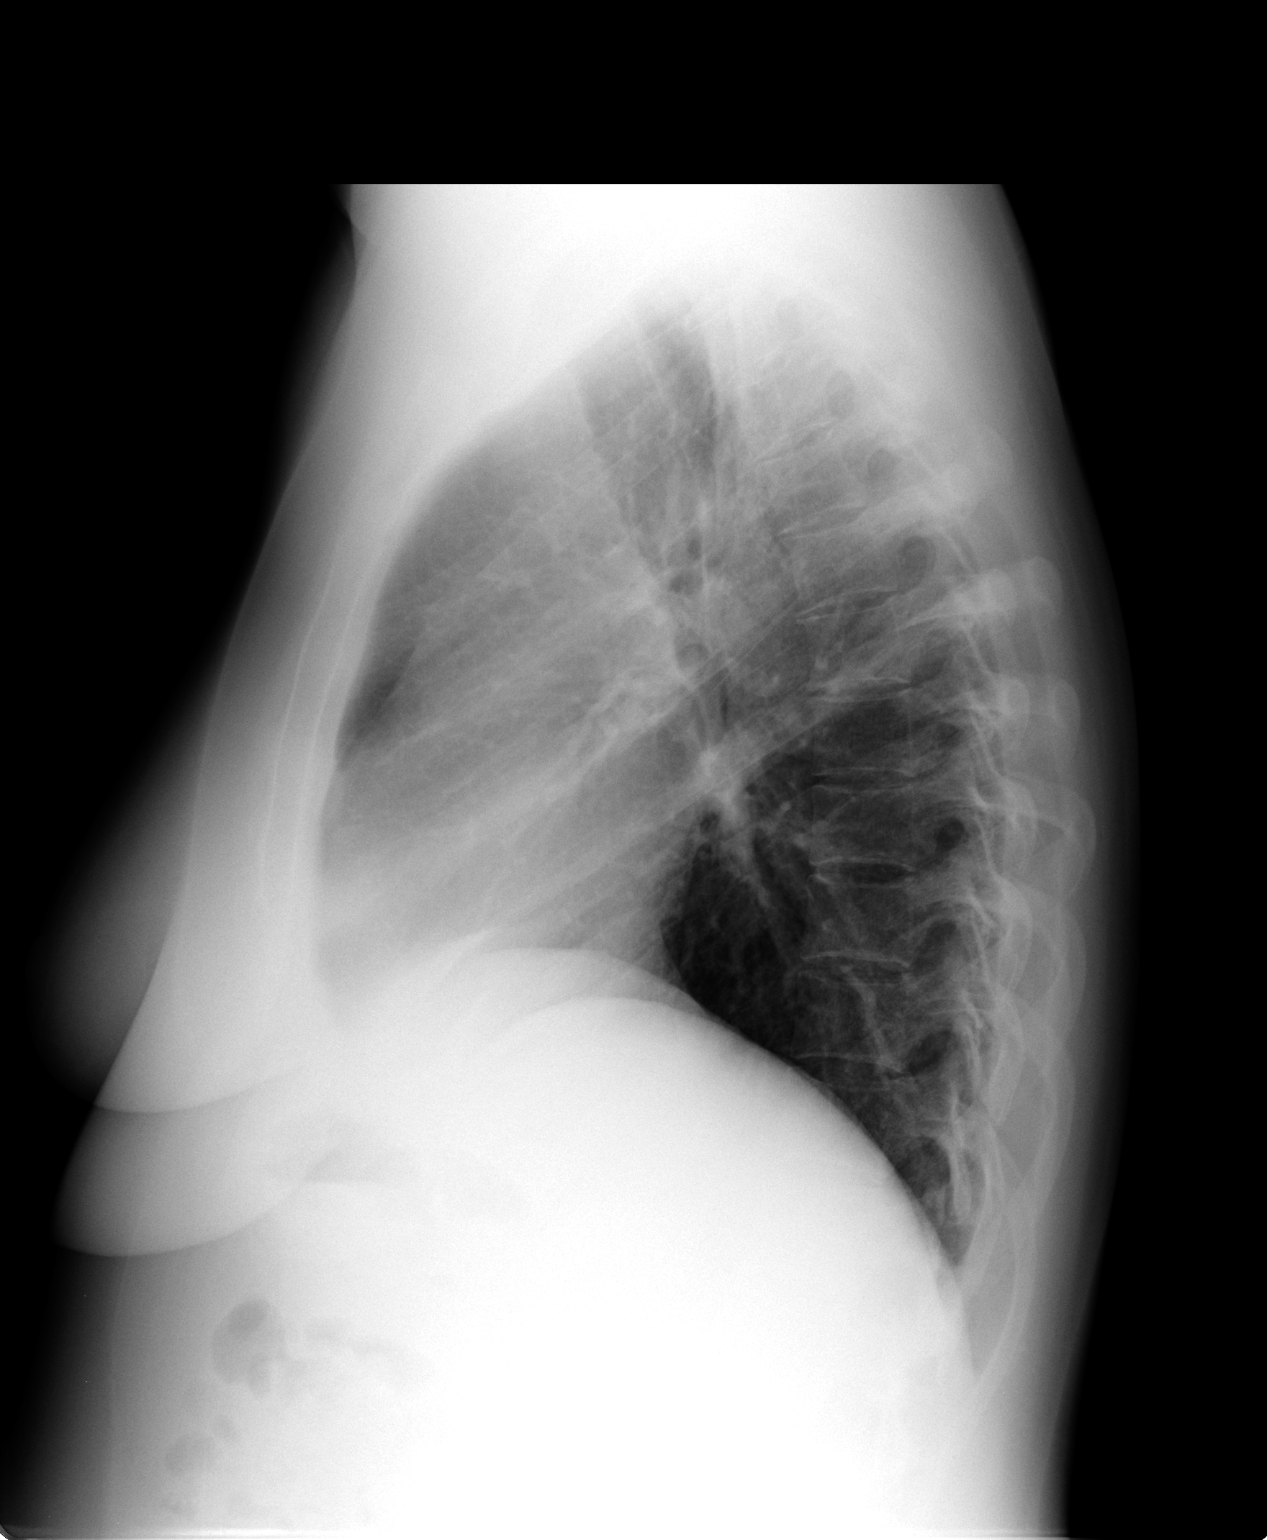

[2 of 2 positions shown; findings below may reference images not displayed]

FINDINGS: Heart and mediastinal contours are within normal limits.
No focal opacities or effusions.  No acute bony abnormality. Old
right-sided rib fracture noted, stable.
IMPRESSION: No acute cardiopulmonary disease.

## 2010-01-24 ENCOUNTER — Emergency Department (HOSPITAL_COMMUNITY): Admission: EM | Admit: 2010-01-24 | Discharge: 2010-01-25 | Payer: Self-pay | Admitting: Emergency Medicine

## 2010-02-13 IMAGING — CT CT HEAD W/O CM
1 series · 15 of 30 positions shown, 19 images · non-contrast
Comparison: 09/18/2008.  07/08/2008.

CLINICAL DATA: 43-year-old female with right-sided headache for 3
days.  Hypertension.

CT HEAD WITHOUT CONTRAST
TECHNIQUE: Contiguous axial images were obtained from the base of
the skull through the vertex without contrast.

[Series 2: headseq 4.8 h45s · axial · 0.40mm/px · z∈[-119,+12]mm · 15 of 30 slices shown, 19 images]
[im 2/30  brain]
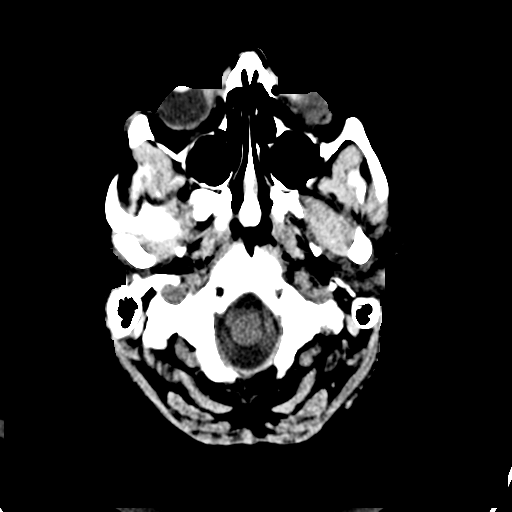
[im 2/30  bone]
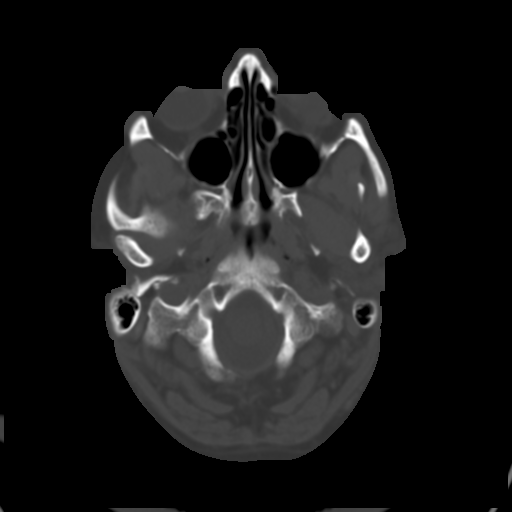
[im 4/30  brain]
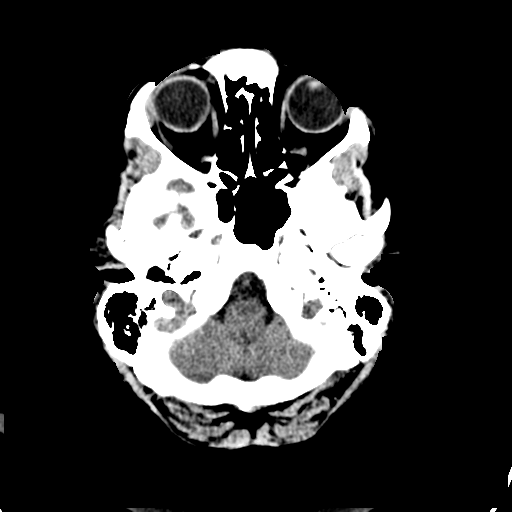
[im 6/30  brain]
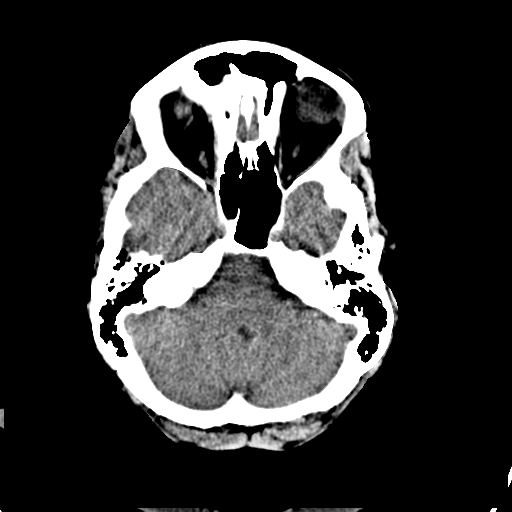
[im 8/30  brain]
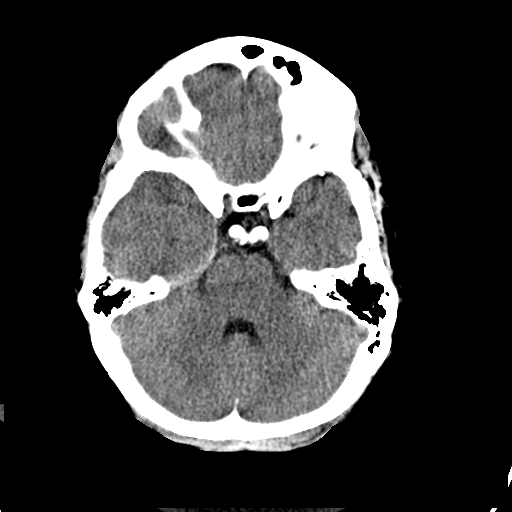
[im 10/30  brain]
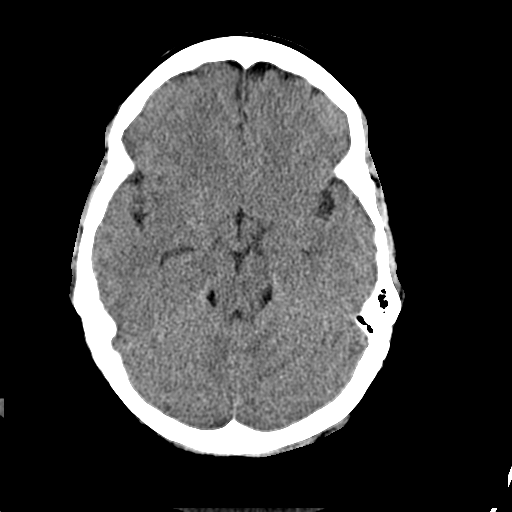
[im 10/30  bone]
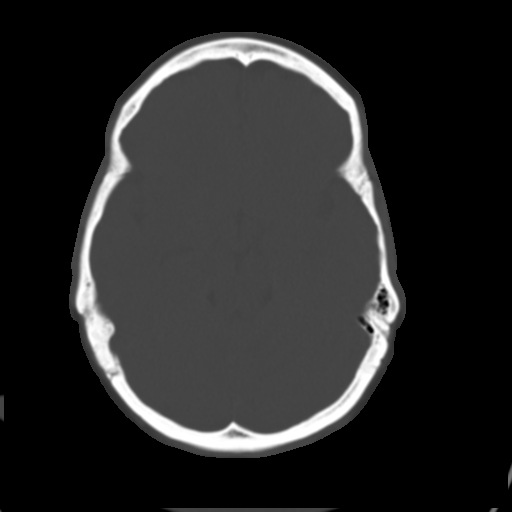
[im 12/30  brain]
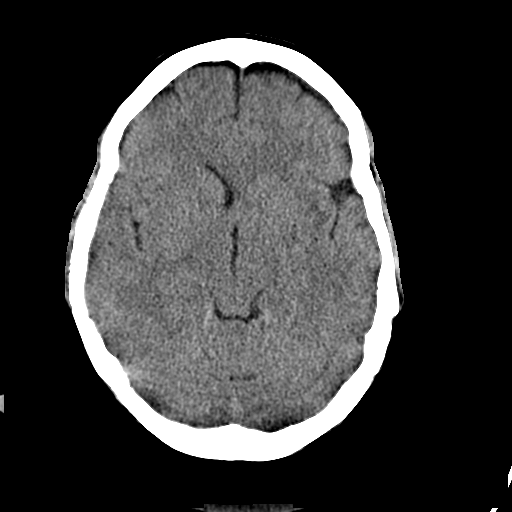
[im 14/30  brain]
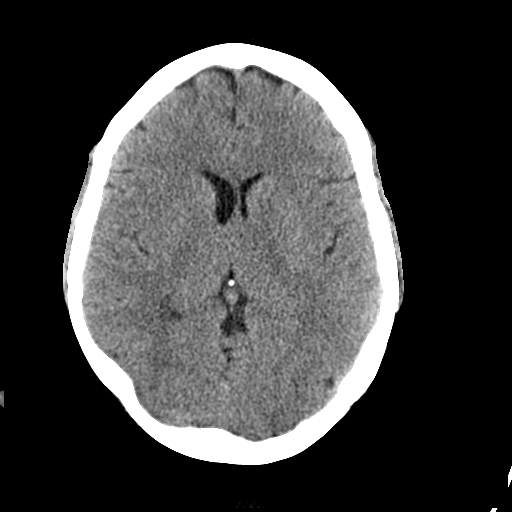
[im 16/30  brain]
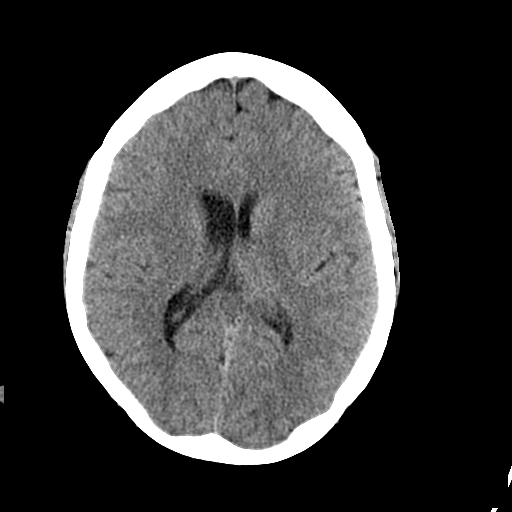
[im 17/30  brain]
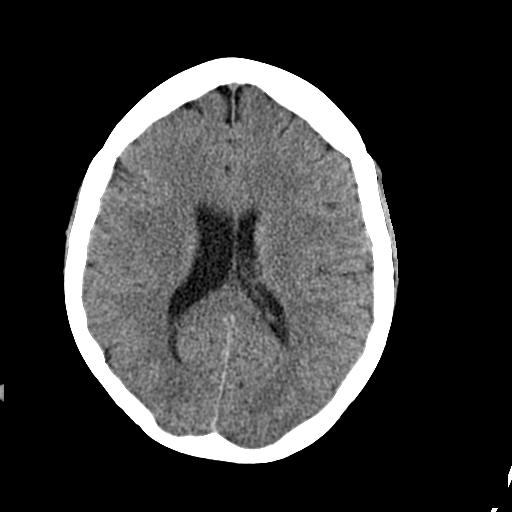
[im 17/30  bone]
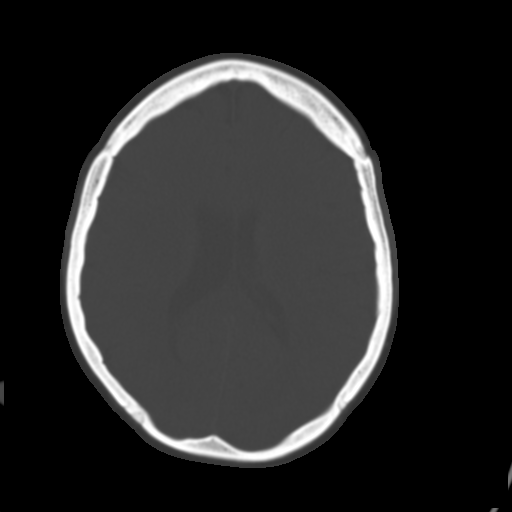
[im 19/30  brain]
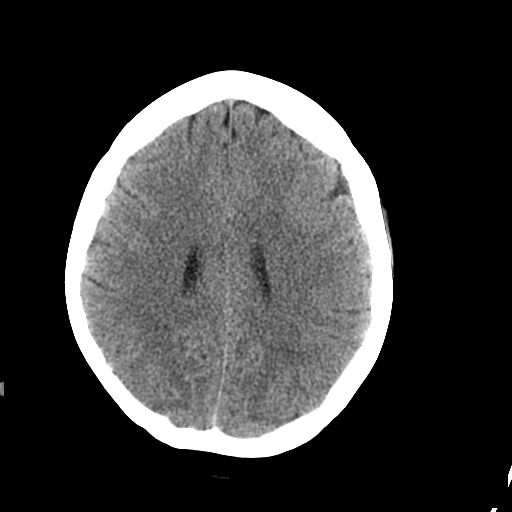
[im 21/30  brain]
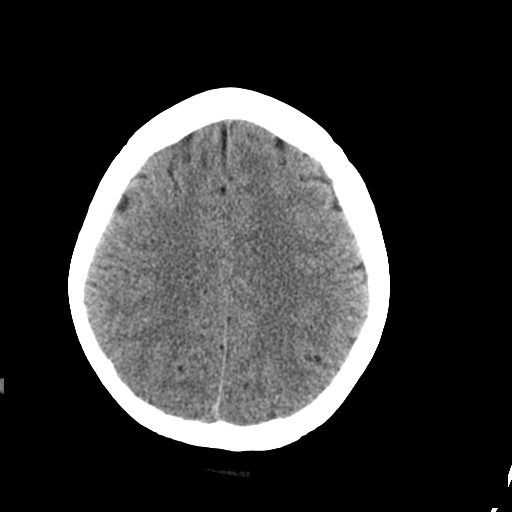
[im 23/30  brain]
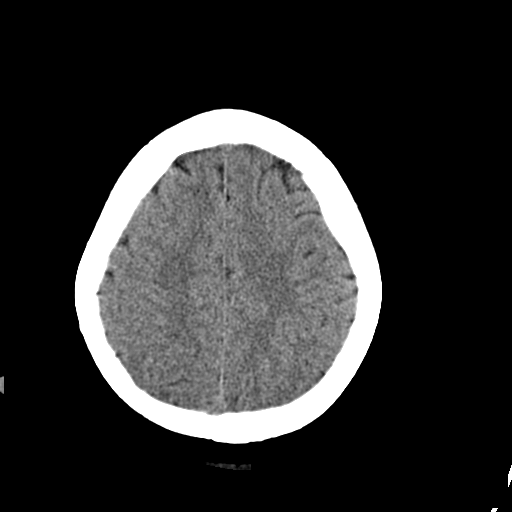
[im 25/30  brain]
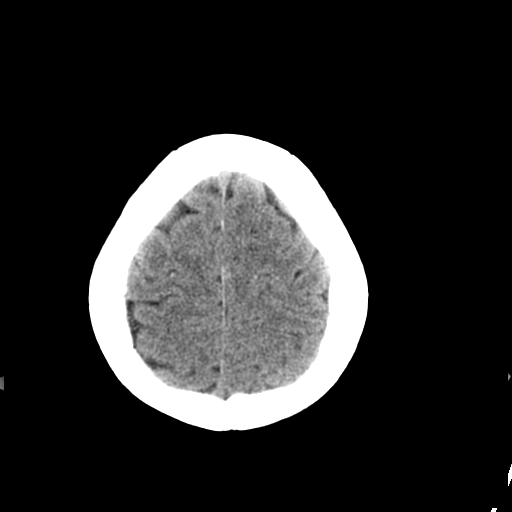
[im 25/30  bone]
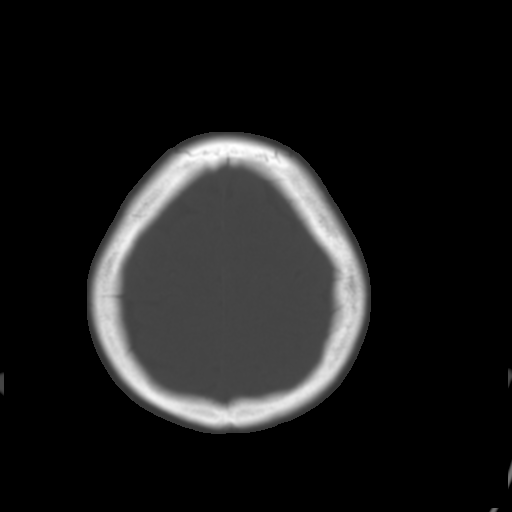
[im 27/30  brain]
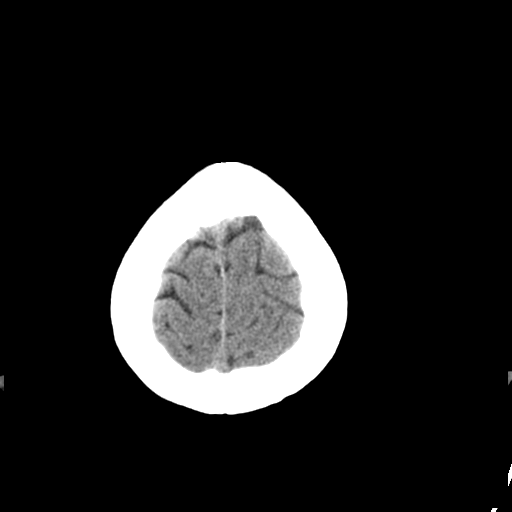
[im 29/30  brain]
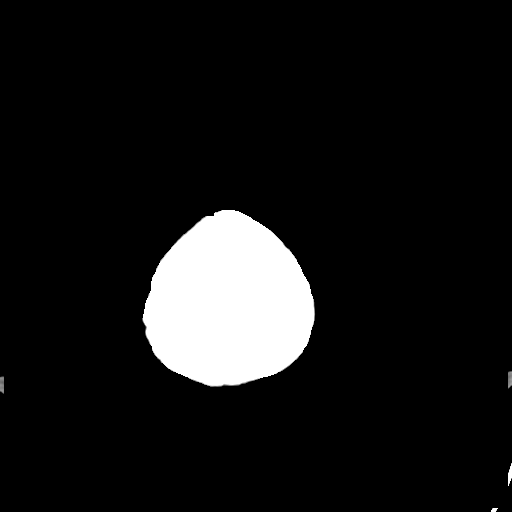

[15 of 30 positions shown; findings below may reference images not displayed]

FINDINGS: Visualized orbits and scalp soft tissues are within
normal limits.  Visualized paranasal sinuses and mastoids are
clear.  No acute osseous abnormality identified.

Cerebral volume is normal.  Stable ventricle size and
configuration.  No midline shift, mass effect, or evidence of mass
lesion.  Stable gray-white matter differentiation throughout the
brain.  Mild left subinsular asymmetric hypodensity is re-
identified and stable.  As this occurs primarily caudal to the
level of the basal ganglia, dilated perivascular spaces are
favored. No evidence of acute cortically based infarct identified.
No acute intracranial hemorrhage identified.  No suspicious
intracranial vascular hyperdensity.
IMPRESSION: No acute intracranial abnormality.  Suspect incidental subinsular
dilated perivascular spaces on the left, otherwise negative
noncontrast appearance of the brain.

## 2010-02-21 IMAGING — CR DG CHEST 2V
2 series · 2 of 2 positions shown · non-contrast
Comparison: 11/20/2008

CLINICAL DATA: Chest and back pain.

CHEST - 2 VIEW

[w chest pa]
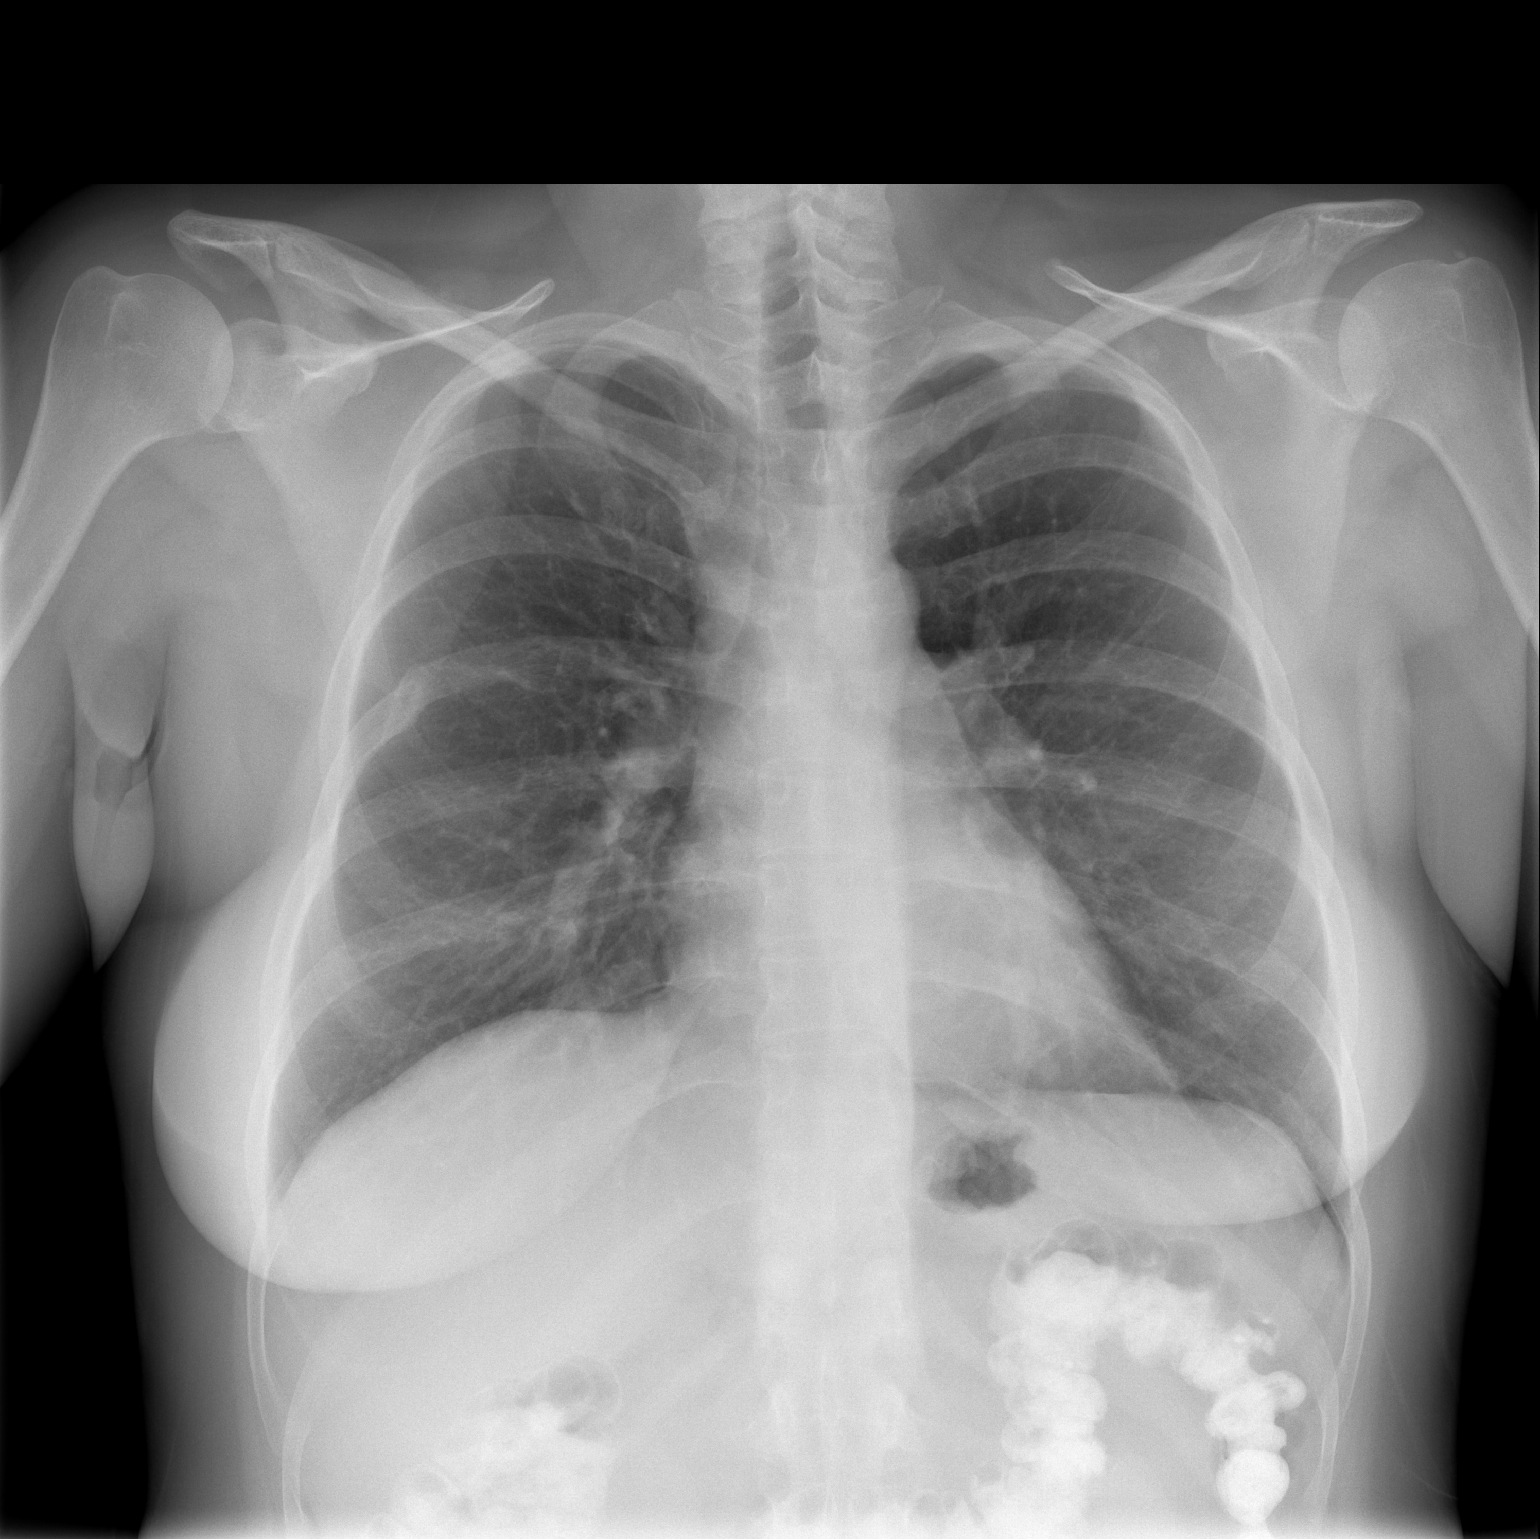

[w chest lat]
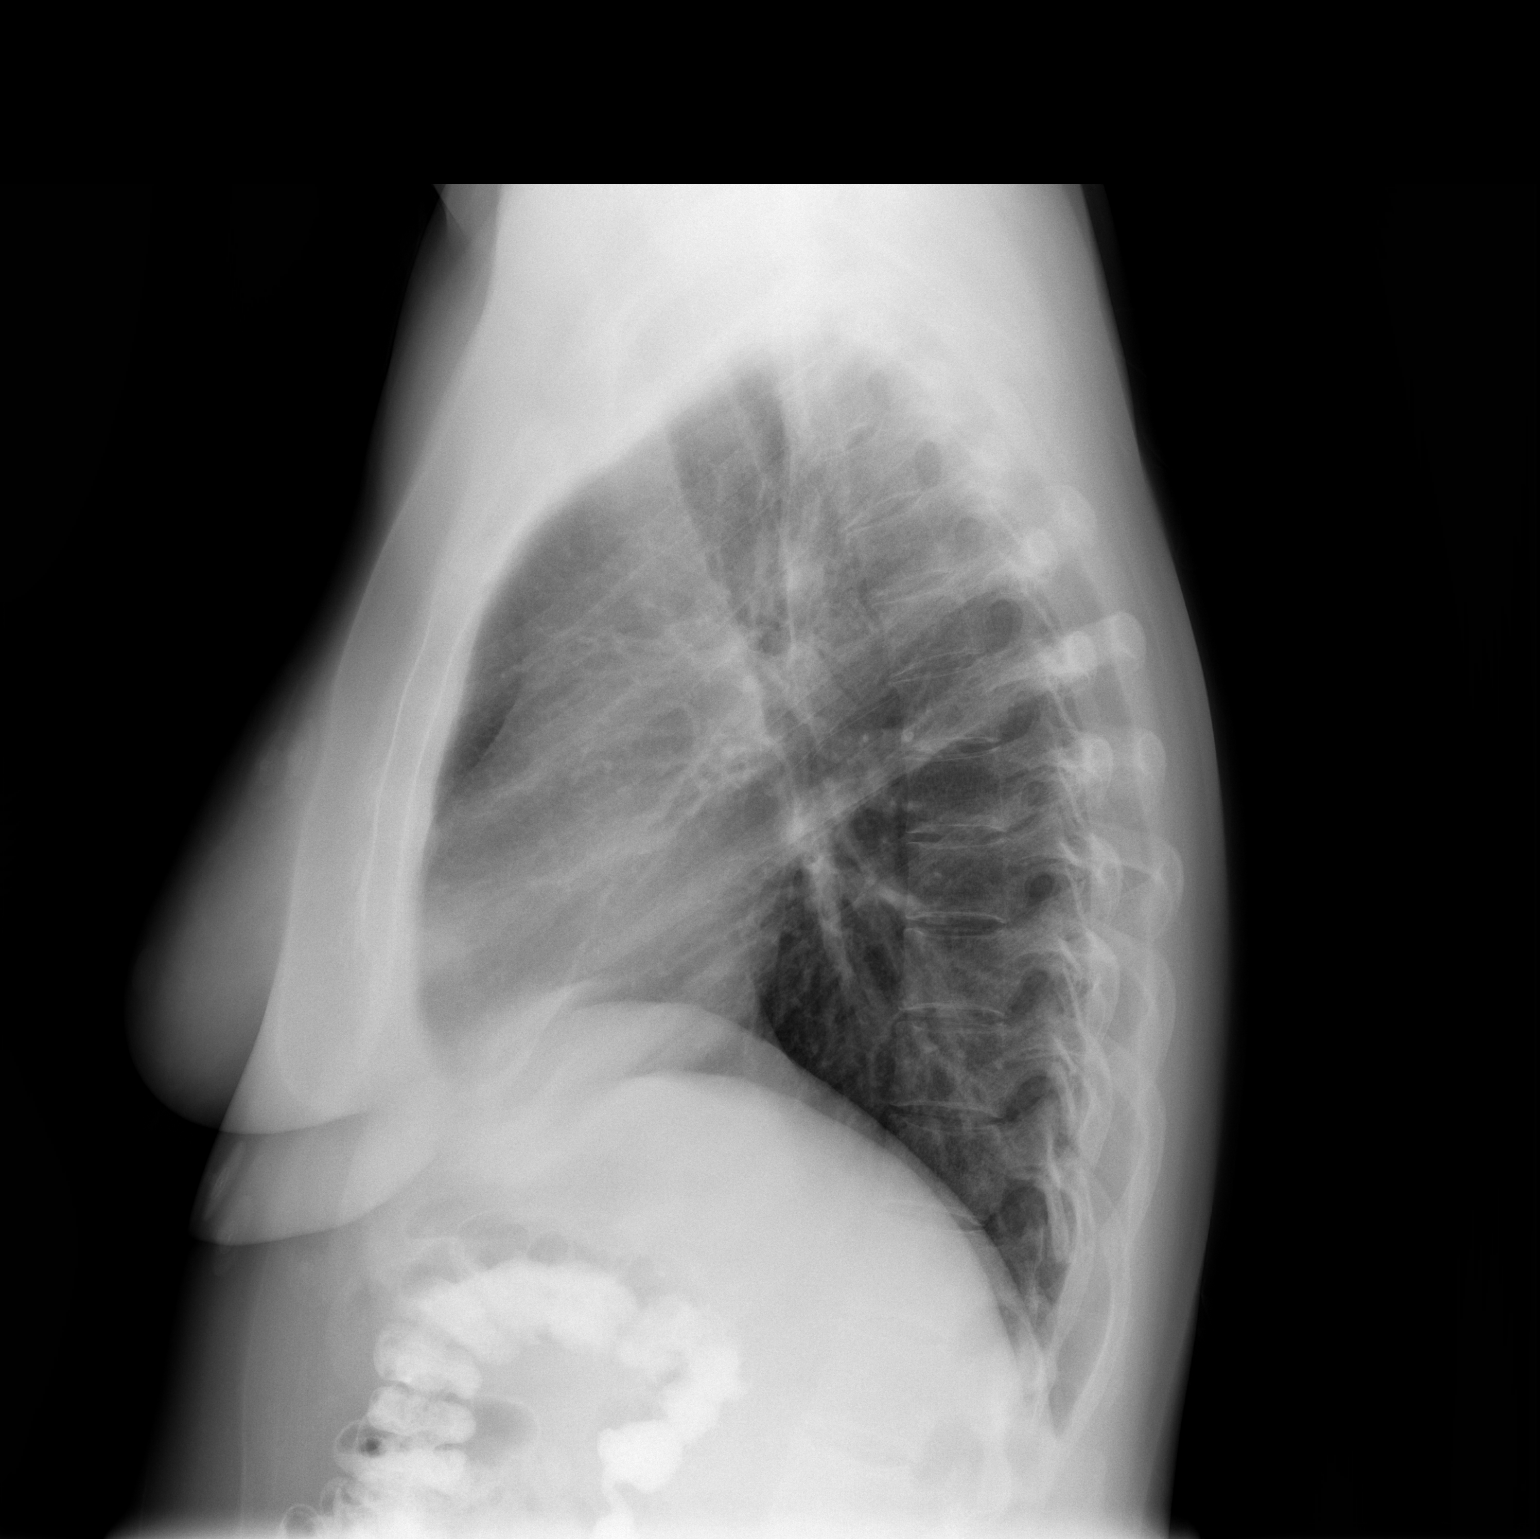

[2 of 2 positions shown; findings below may reference images not displayed]

FINDINGS: Heart size and mediastinal contours are normal.  Both
lungs are clear.  Old right posterior sixth rib fracture again
noted.
IMPRESSION: Stable exam.  No active cardiopulmonary disease.

## 2010-02-24 ENCOUNTER — Encounter: Payer: Self-pay | Admitting: Family Medicine

## 2010-03-01 ENCOUNTER — Emergency Department (HOSPITAL_BASED_OUTPATIENT_CLINIC_OR_DEPARTMENT_OTHER): Admission: EM | Admit: 2010-03-01 | Discharge: 2010-03-01 | Payer: Self-pay | Admitting: Emergency Medicine

## 2010-03-06 IMAGING — CR DG CHEST 2V
2 series · 2 of 2 positions shown · non-contrast
Comparison: 11/28/2008

CLINICAL DATA: Right chest pain, possible foreign body ingestion

CHEST - 2 VIEW

[w chest pa]
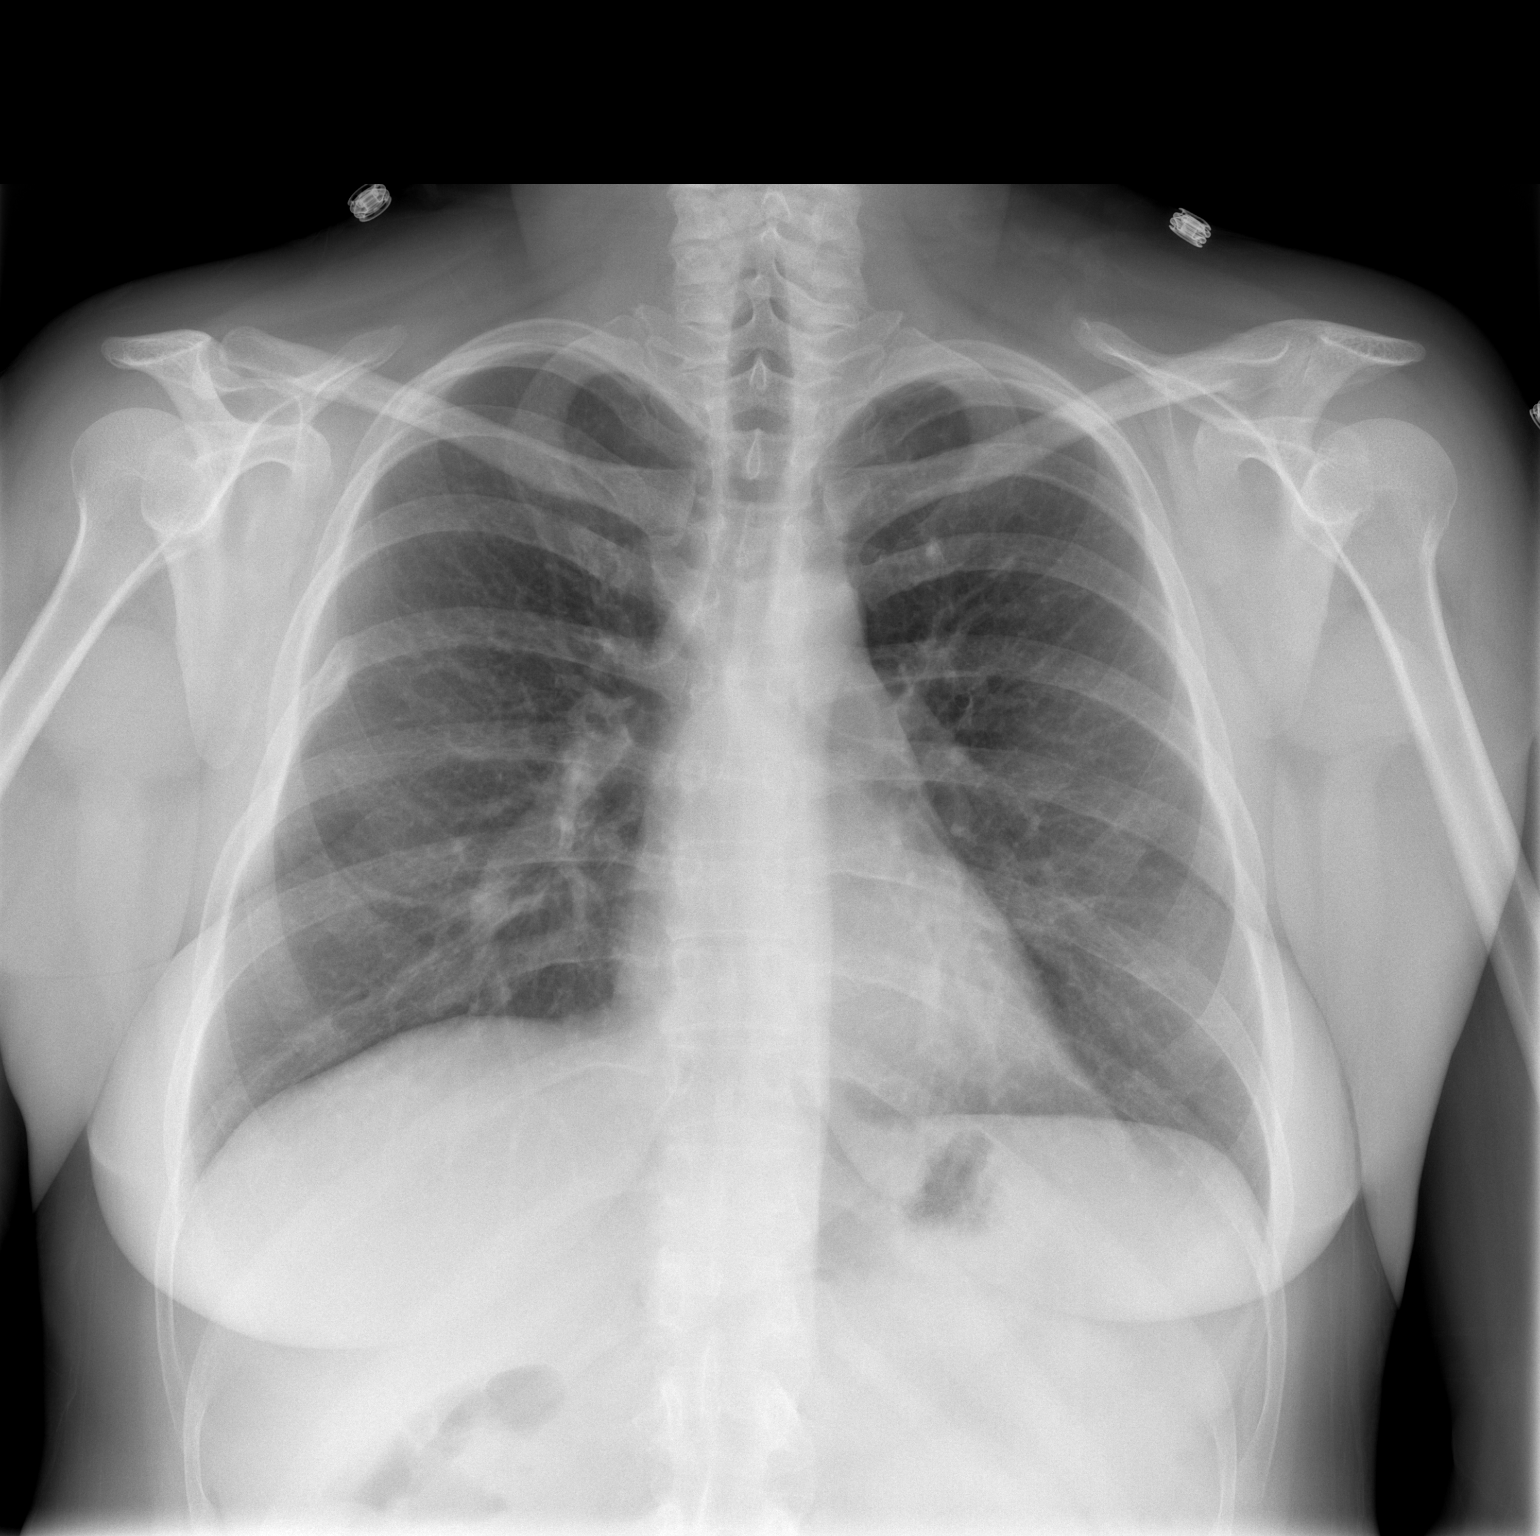

[w chest lat]
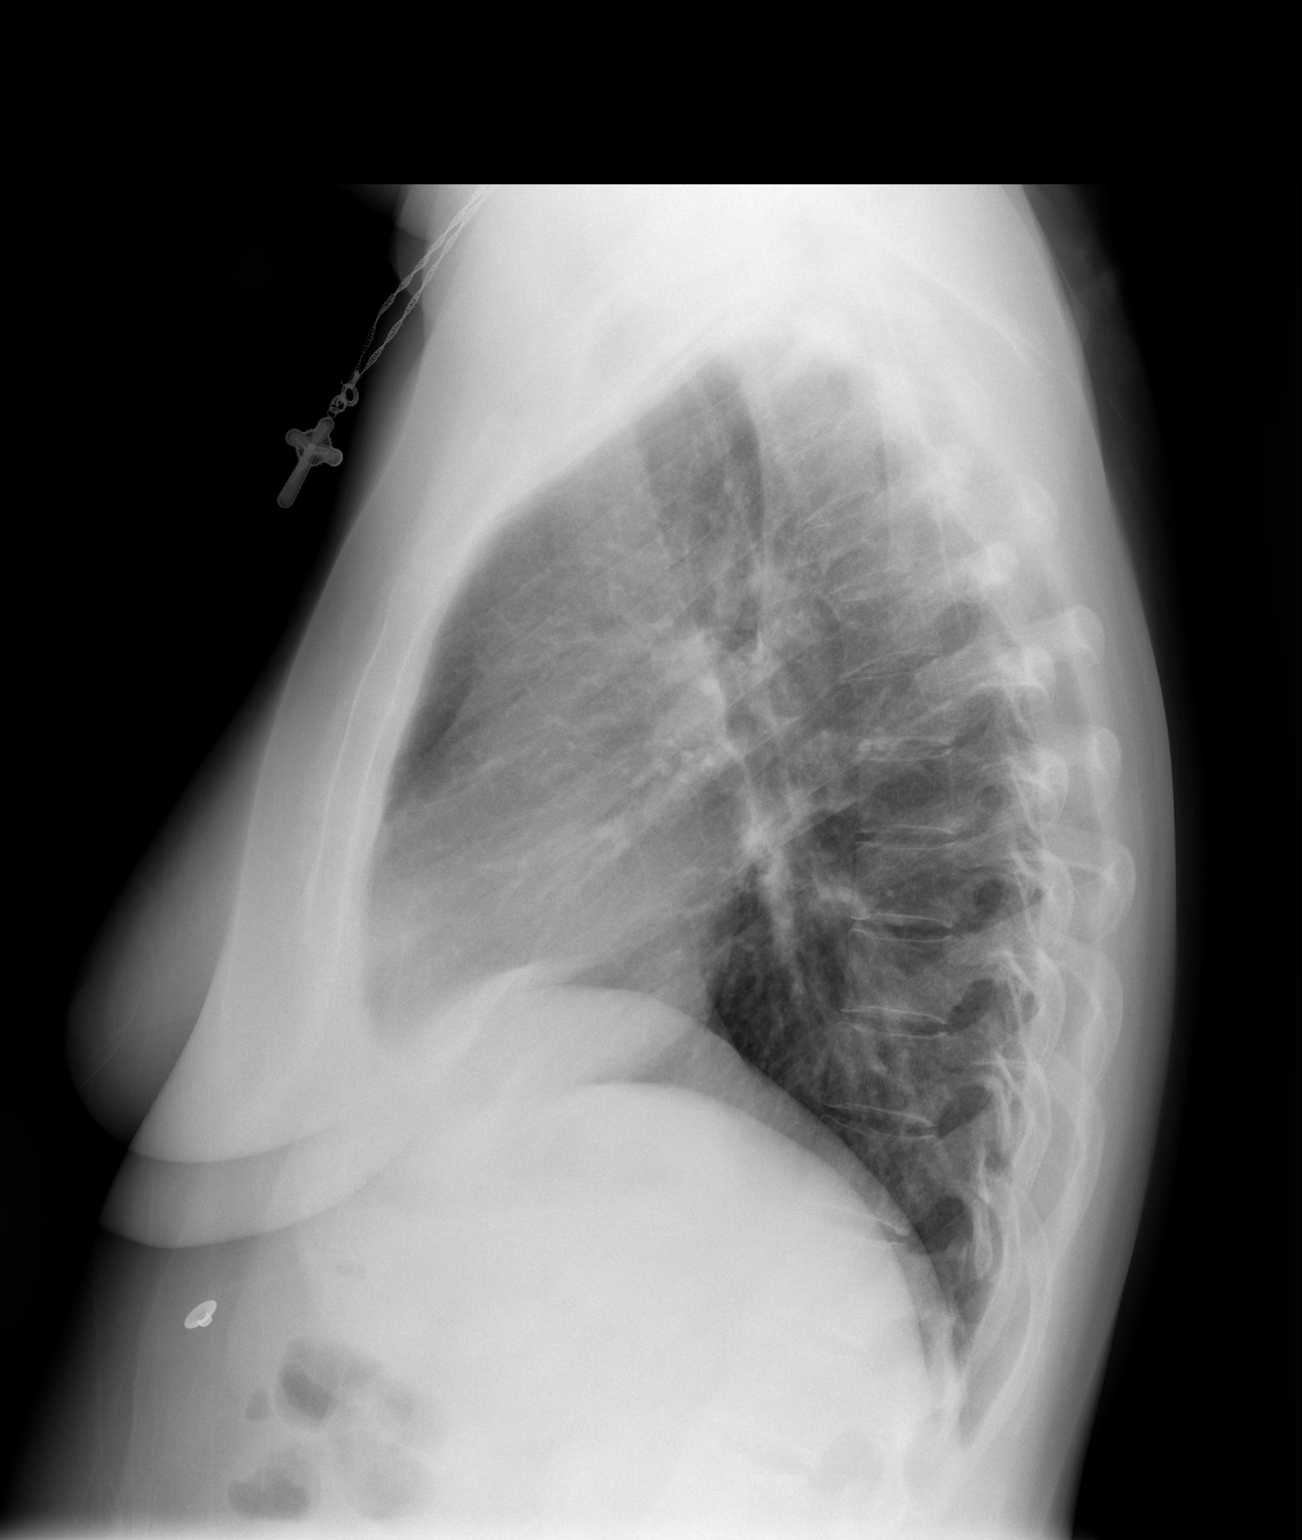

[2 of 2 positions shown; findings below may reference images not displayed]

FINDINGS: Normal heart size and vascularity.  Symmetric lung
volumes and aeration.  No focal consolidation, collapse, edema,
effusion or pneumothorax.  Healed right rib fracture with
deformity.
IMPRESSION: No acute finding by plain radiography.  Stable exam.

## 2010-03-07 ENCOUNTER — Encounter: Payer: Self-pay | Admitting: Family Medicine

## 2010-03-10 ENCOUNTER — Ambulatory Visit: Payer: Self-pay | Admitting: Family Medicine

## 2010-03-11 ENCOUNTER — Encounter: Payer: Self-pay | Admitting: Family Medicine

## 2010-03-11 ENCOUNTER — Telehealth: Payer: Self-pay | Admitting: Family Medicine

## 2010-03-11 LAB — CONVERTED CEMR LAB
Trich, Wet Prep: NONE SEEN
WBC, Wet Prep HPF POC: NONE SEEN
Yeast Wet Prep HPF POC: NONE SEEN

## 2010-03-14 ENCOUNTER — Ambulatory Visit: Payer: Self-pay | Admitting: Family Medicine

## 2010-03-14 DIAGNOSIS — E559 Vitamin D deficiency, unspecified: Secondary | ICD-10-CM | POA: Insufficient documentation

## 2010-03-15 ENCOUNTER — Telehealth: Payer: Self-pay | Admitting: Family Medicine

## 2010-03-15 LAB — CONVERTED CEMR LAB: Vit D, 25-Hydroxy: 32 ng/mL (ref 30–89)

## 2010-03-16 ENCOUNTER — Telehealth: Payer: Self-pay | Admitting: Family Medicine

## 2010-03-18 ENCOUNTER — Emergency Department (HOSPITAL_BASED_OUTPATIENT_CLINIC_OR_DEPARTMENT_OTHER): Admission: EM | Admit: 2010-03-18 | Discharge: 2010-03-18 | Payer: Self-pay | Admitting: Emergency Medicine

## 2010-03-18 ENCOUNTER — Ambulatory Visit: Payer: Self-pay | Admitting: Diagnostic Radiology

## 2010-03-21 ENCOUNTER — Encounter: Payer: Self-pay | Admitting: Family Medicine

## 2010-03-23 ENCOUNTER — Telehealth: Payer: Self-pay | Admitting: Family Medicine

## 2010-03-24 ENCOUNTER — Emergency Department (HOSPITAL_COMMUNITY): Admission: EM | Admit: 2010-03-24 | Discharge: 2010-03-24 | Payer: Self-pay | Admitting: Emergency Medicine

## 2010-03-25 ENCOUNTER — Telehealth: Payer: Self-pay | Admitting: Internal Medicine

## 2010-04-04 ENCOUNTER — Ambulatory Visit: Payer: Self-pay | Admitting: Internal Medicine

## 2010-04-04 ENCOUNTER — Encounter: Payer: Self-pay | Admitting: Critical Care Medicine

## 2010-04-09 ENCOUNTER — Emergency Department (HOSPITAL_BASED_OUTPATIENT_CLINIC_OR_DEPARTMENT_OTHER): Admission: EM | Admit: 2010-04-09 | Discharge: 2010-04-09 | Payer: Self-pay | Admitting: Emergency Medicine

## 2010-04-10 ENCOUNTER — Ambulatory Visit: Payer: Self-pay | Admitting: Radiology

## 2010-04-14 ENCOUNTER — Encounter: Payer: Self-pay | Admitting: Family Medicine

## 2010-04-19 IMAGING — CR DG ABDOMEN ACUTE W/ 1V CHEST
4 series · 4 of 4 positions shown · non-contrast
Comparison: Radiographs 08/14/2008 and CT 10/09/2008.

CLINICAL DATA: Abdominal pain and nausea.  Possible impaction.

ACUTE ABDOMEN SERIES (ABDOMEN 2 VIEW & CHEST 1 VIEW)

[w chest pa]
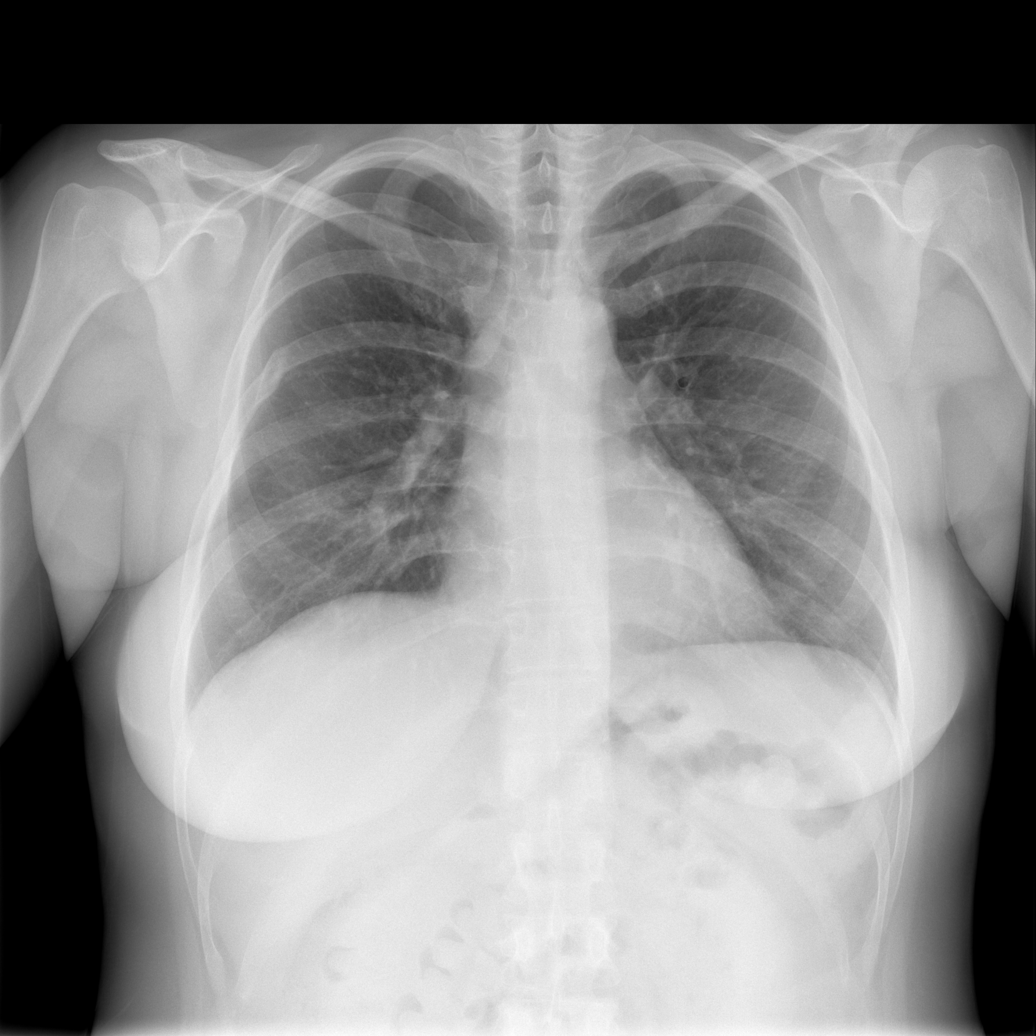

[w abdomen upright]
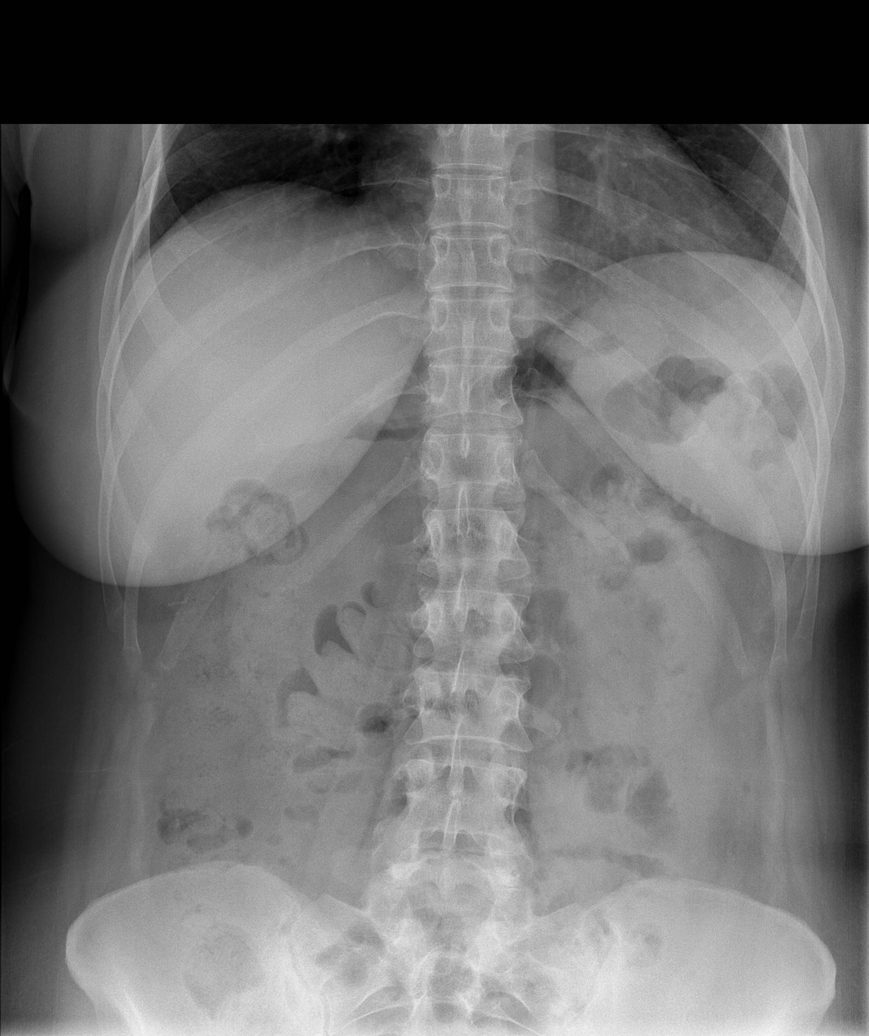

[t abdomen supine (1 of 2)]
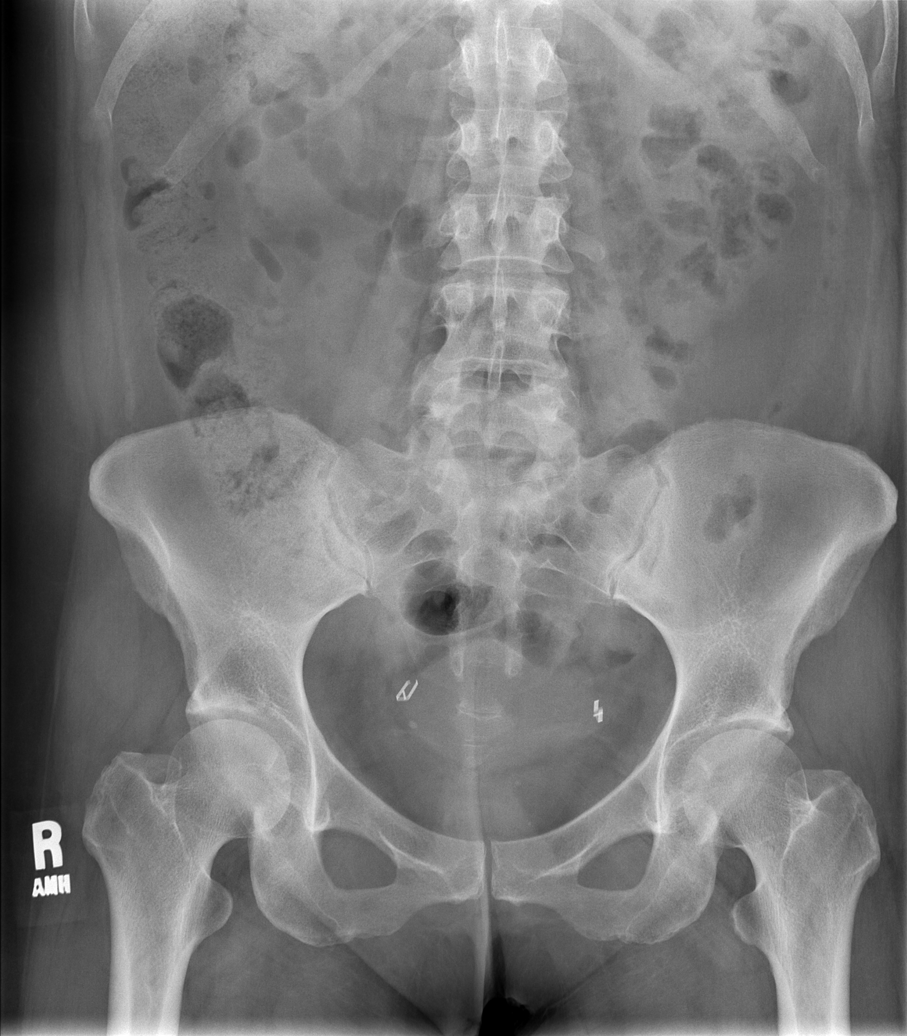

[t abdomen supine (2 of 2)]
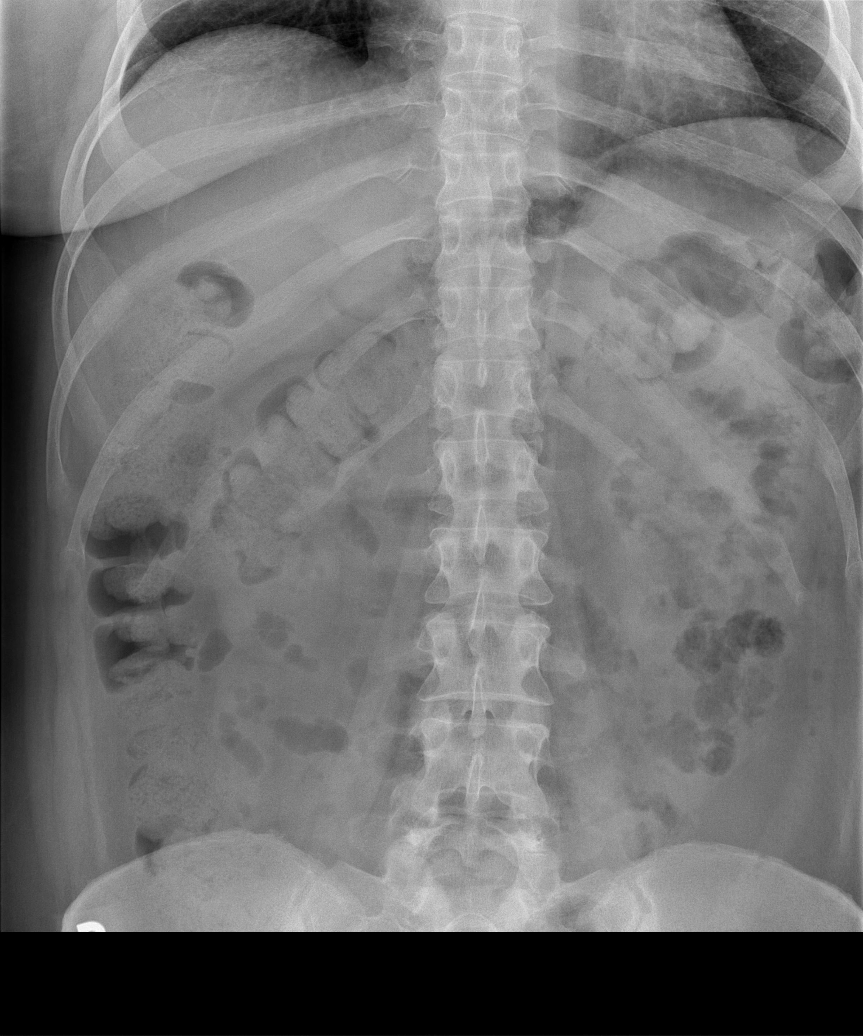

[4 of 4 positions shown; findings below may reference images not displayed]

FINDINGS: The heart size and mediastinal contours are stable.  The
lungs are clear.  There is no pleural effusion.  An old fracture of
the right sixth rib appears stable.

Supine and erect views of the abdomen demonstrate a nonobstructive
bowel gas pattern.  There is some stool in the right colon, but
this does not appear significantly increased.  There is no evidence
of fecal impaction.  Bilateral tubal ligation clips and right
pelvic phlebolith appear stable.  There are no acute osseous
findings.
IMPRESSION: 1.  No acute cardiopulmonary or abdominal process.
2.  No significant fecal impaction identified.

## 2010-04-21 ENCOUNTER — Ambulatory Visit: Payer: Self-pay | Admitting: Critical Care Medicine

## 2010-04-22 ENCOUNTER — Telehealth (INDEPENDENT_AMBULATORY_CARE_PROVIDER_SITE_OTHER): Payer: Self-pay | Admitting: *Deleted

## 2010-04-22 ENCOUNTER — Ambulatory Visit: Payer: Self-pay | Admitting: Diagnostic Radiology

## 2010-04-22 ENCOUNTER — Emergency Department (HOSPITAL_BASED_OUTPATIENT_CLINIC_OR_DEPARTMENT_OTHER): Admission: EM | Admit: 2010-04-22 | Discharge: 2010-04-23 | Payer: Self-pay | Admitting: Emergency Medicine

## 2010-04-24 ENCOUNTER — Emergency Department (HOSPITAL_COMMUNITY): Admission: EM | Admit: 2010-04-24 | Discharge: 2010-04-25 | Payer: Self-pay | Admitting: Emergency Medicine

## 2010-04-25 ENCOUNTER — Encounter: Payer: Self-pay | Admitting: Family Medicine

## 2010-04-29 IMAGING — CR DG CHEST 2V
2 series · 2 of 2 positions shown · non-contrast
Comparison: 12/11/2008

CLINICAL DATA: Short of breath/chest pain

CHEST - 2 VIEW

[w chest pa]
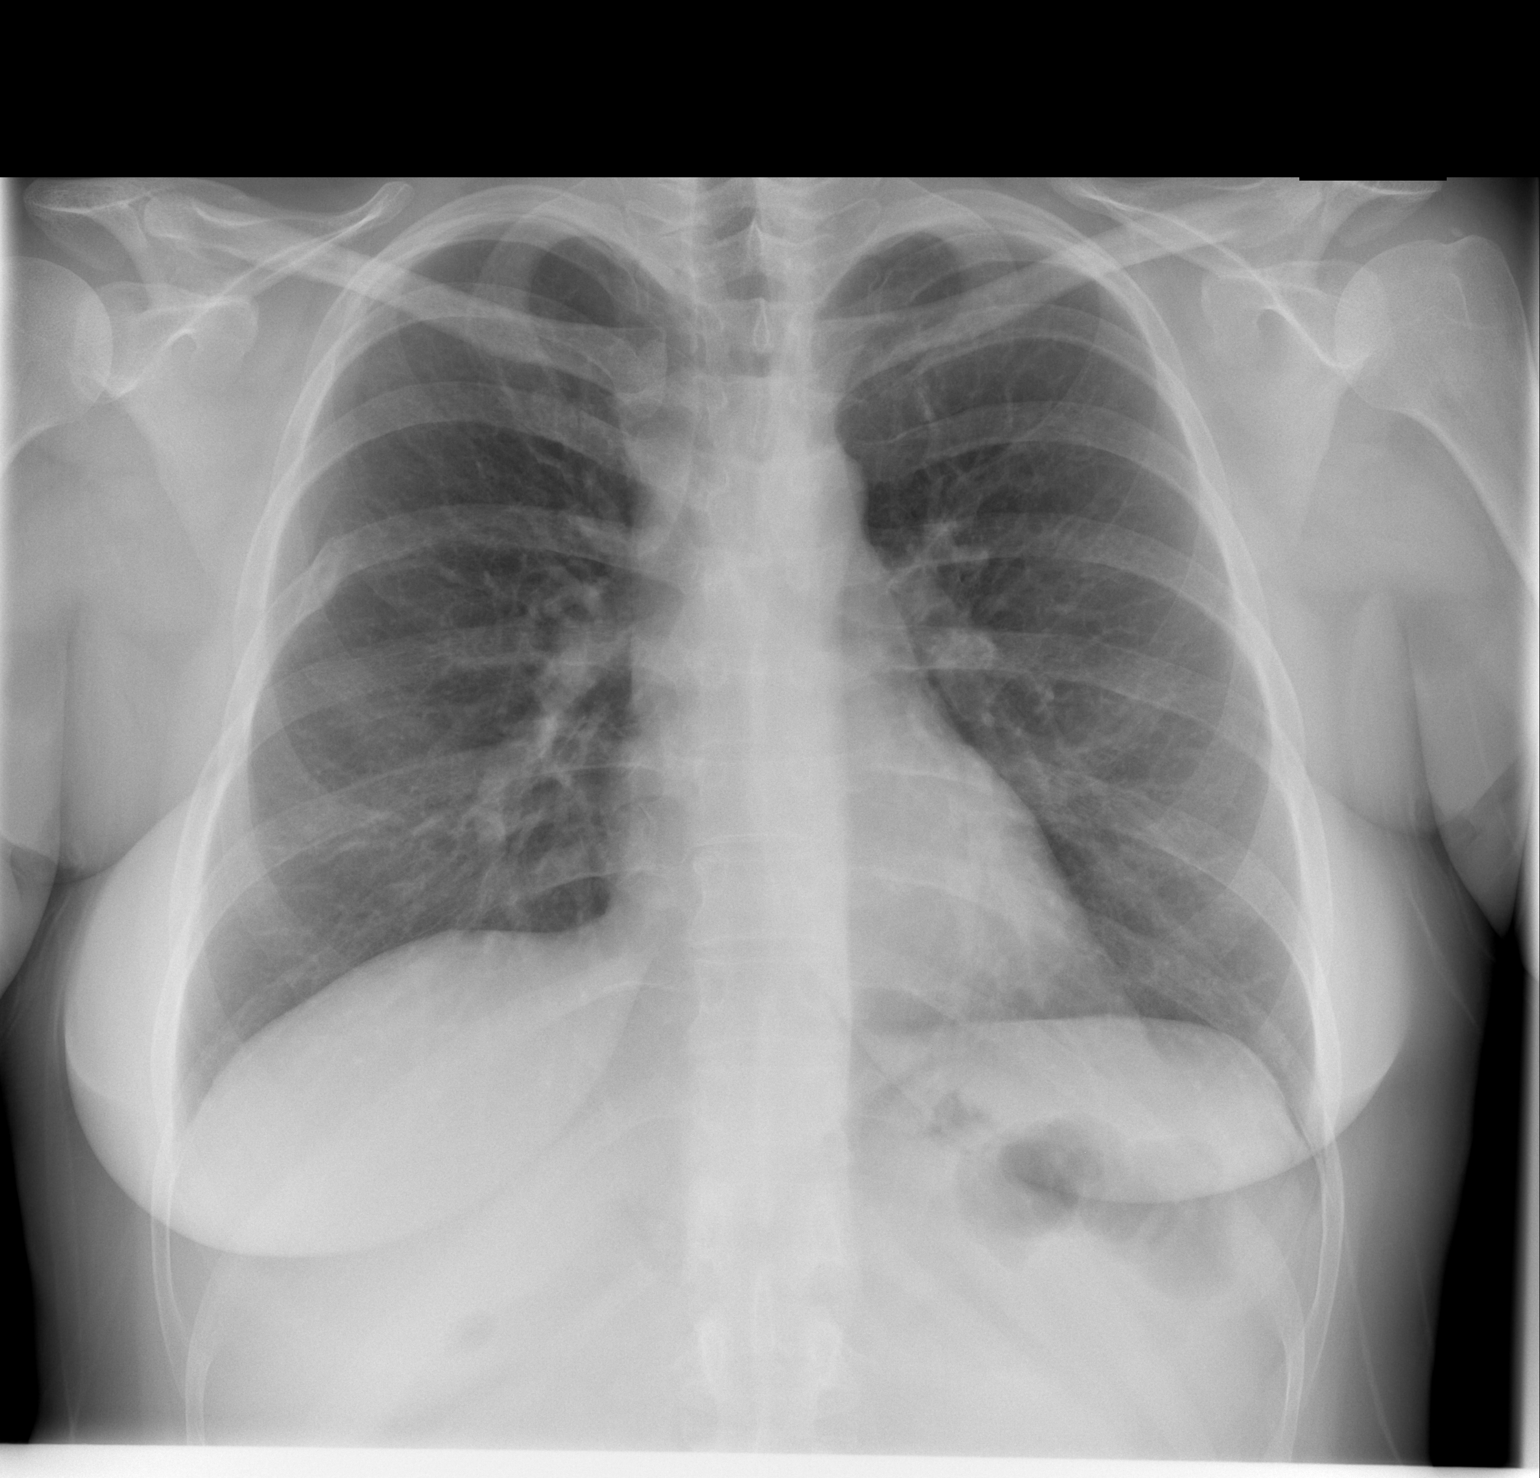

[w chest lat]
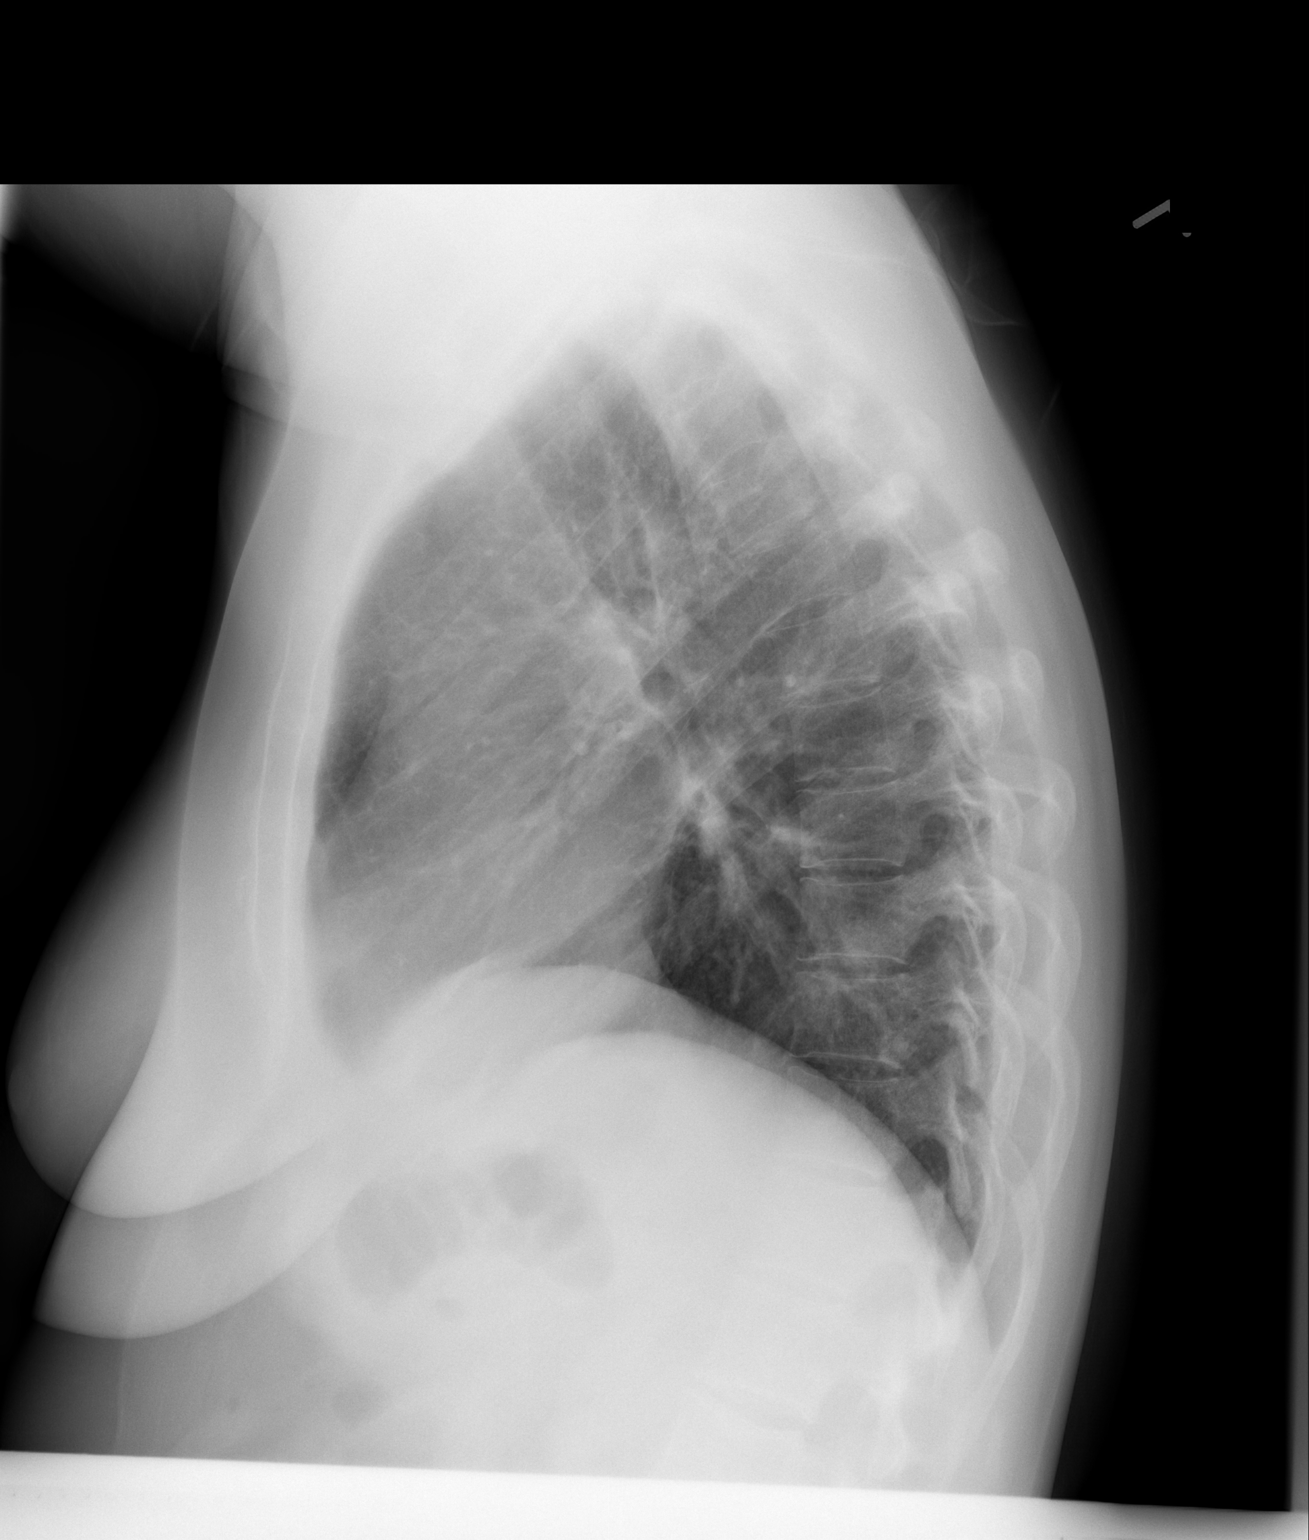

[2 of 2 positions shown; findings below may reference images not displayed]

FINDINGS: Heart and mediastinal contours normal.  Mild
peribronchial thickening but no active pulmonary process.  Osseous
structures intact with old, healed fracture of the right posterior
sixth rib.
IMPRESSION: No active disease.

## 2010-05-04 ENCOUNTER — Ambulatory Visit: Payer: Self-pay | Admitting: Family Medicine

## 2010-05-09 ENCOUNTER — Telehealth (INDEPENDENT_AMBULATORY_CARE_PROVIDER_SITE_OTHER): Payer: Self-pay | Admitting: *Deleted

## 2010-05-09 ENCOUNTER — Emergency Department (HOSPITAL_COMMUNITY): Admission: EM | Admit: 2010-05-09 | Discharge: 2010-05-09 | Payer: Self-pay | Admitting: Emergency Medicine

## 2010-05-12 ENCOUNTER — Emergency Department (HOSPITAL_BASED_OUTPATIENT_CLINIC_OR_DEPARTMENT_OTHER): Admission: EM | Admit: 2010-05-12 | Discharge: 2010-05-12 | Payer: Self-pay | Admitting: Emergency Medicine

## 2010-05-17 ENCOUNTER — Telehealth: Payer: Self-pay | Admitting: Family Medicine

## 2010-05-20 ENCOUNTER — Ambulatory Visit: Payer: Self-pay | Admitting: Family Medicine

## 2010-05-20 ENCOUNTER — Telehealth: Payer: Self-pay | Admitting: Family Medicine

## 2010-05-20 ENCOUNTER — Ambulatory Visit: Payer: Self-pay | Admitting: Diagnostic Radiology

## 2010-05-20 ENCOUNTER — Emergency Department (HOSPITAL_BASED_OUTPATIENT_CLINIC_OR_DEPARTMENT_OTHER): Admission: EM | Admit: 2010-05-20 | Discharge: 2010-05-20 | Payer: Self-pay | Admitting: Emergency Medicine

## 2010-05-21 IMAGING — CR DG SHOULDER 2+V*R*
3 series · 3 of 3 positions shown · non-contrast
Comparison: None

CLINICAL DATA: Right shoulder pain radiating down arm for 3 days,
no acute injury

RIGHT SHOULDER - 2+ VIEW

[view not recorded (1 of 3)]
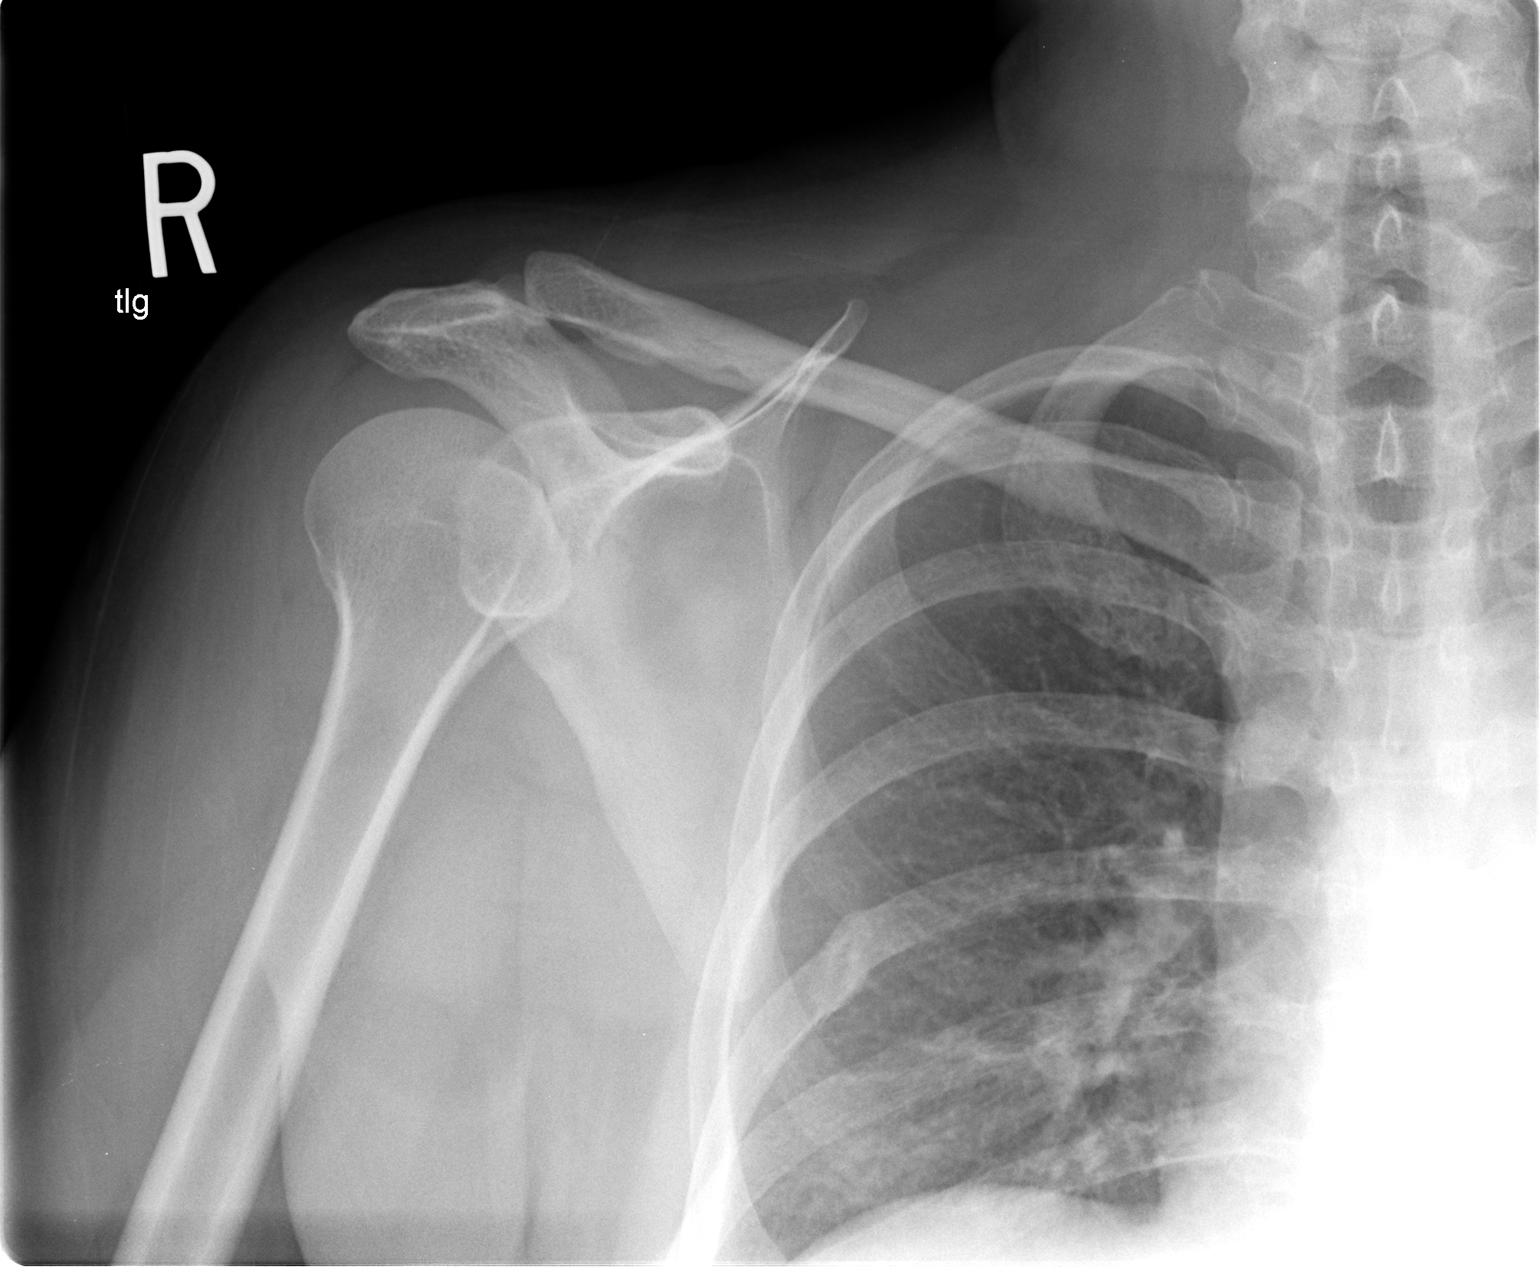

[view not recorded (2 of 3)]
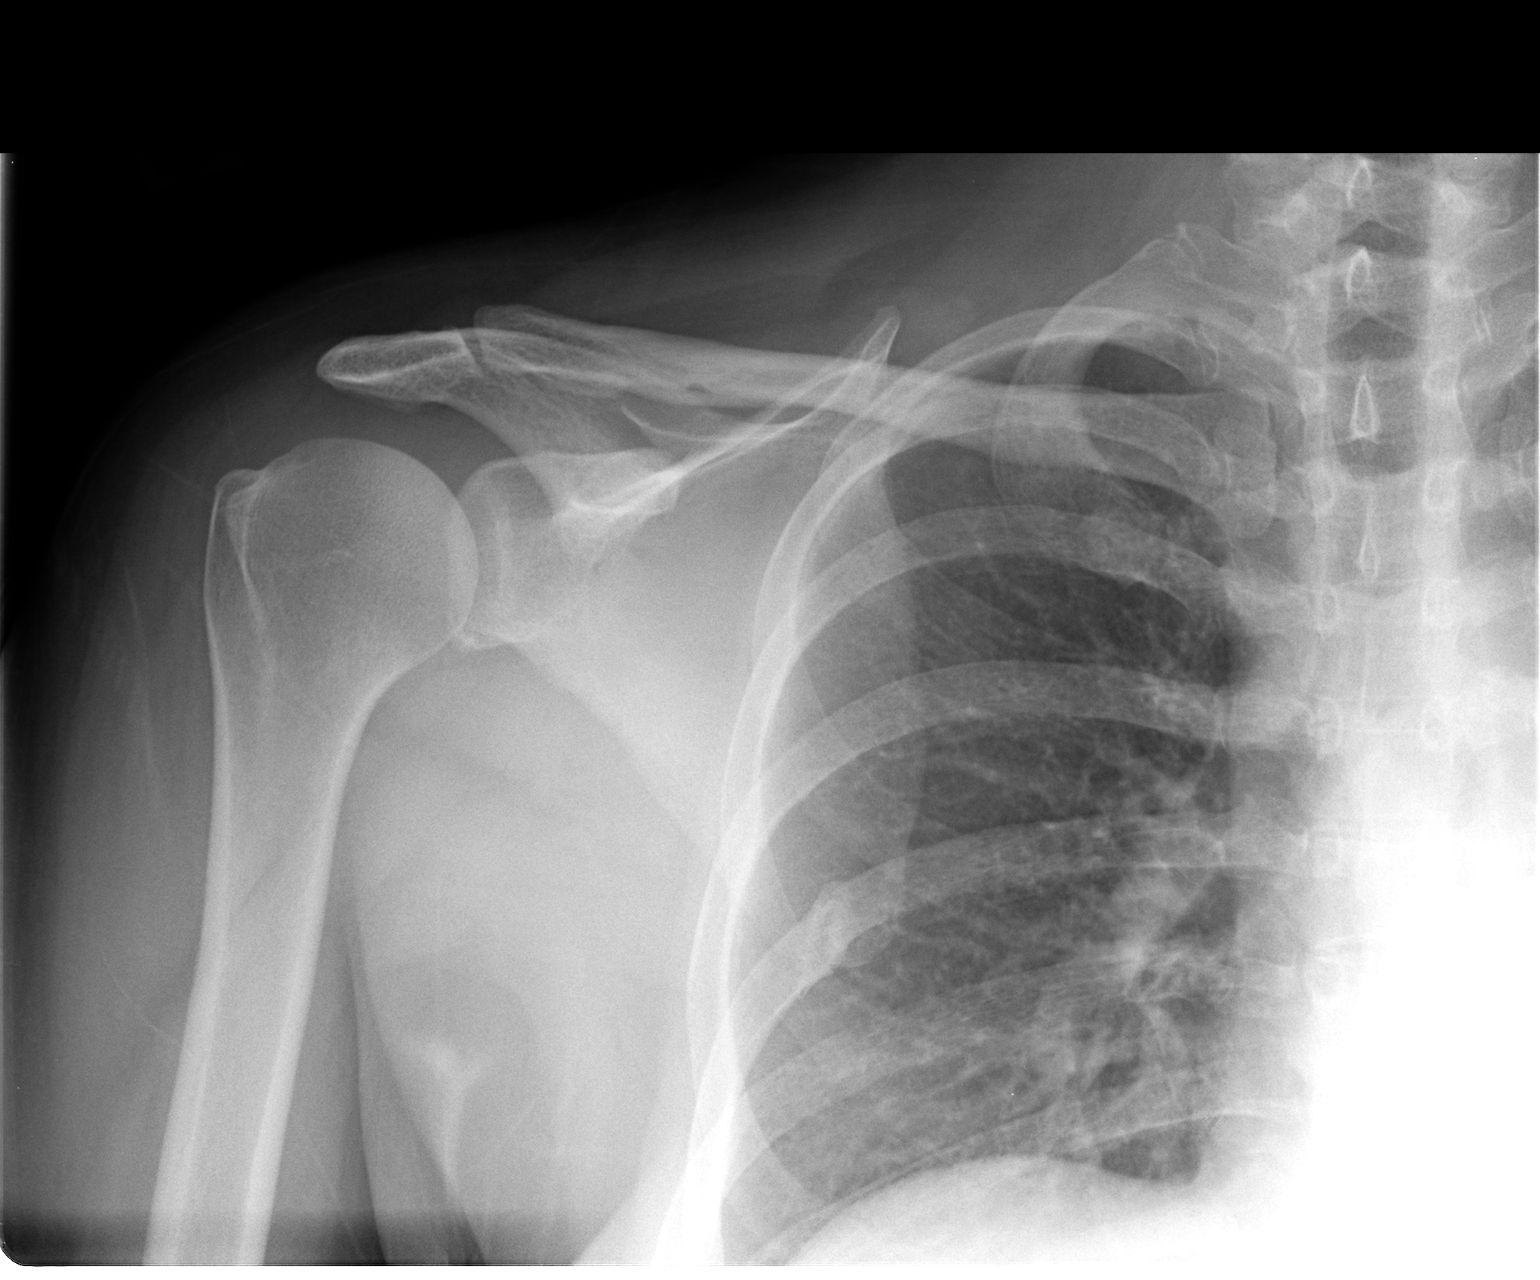

[view not recorded (3 of 3)]
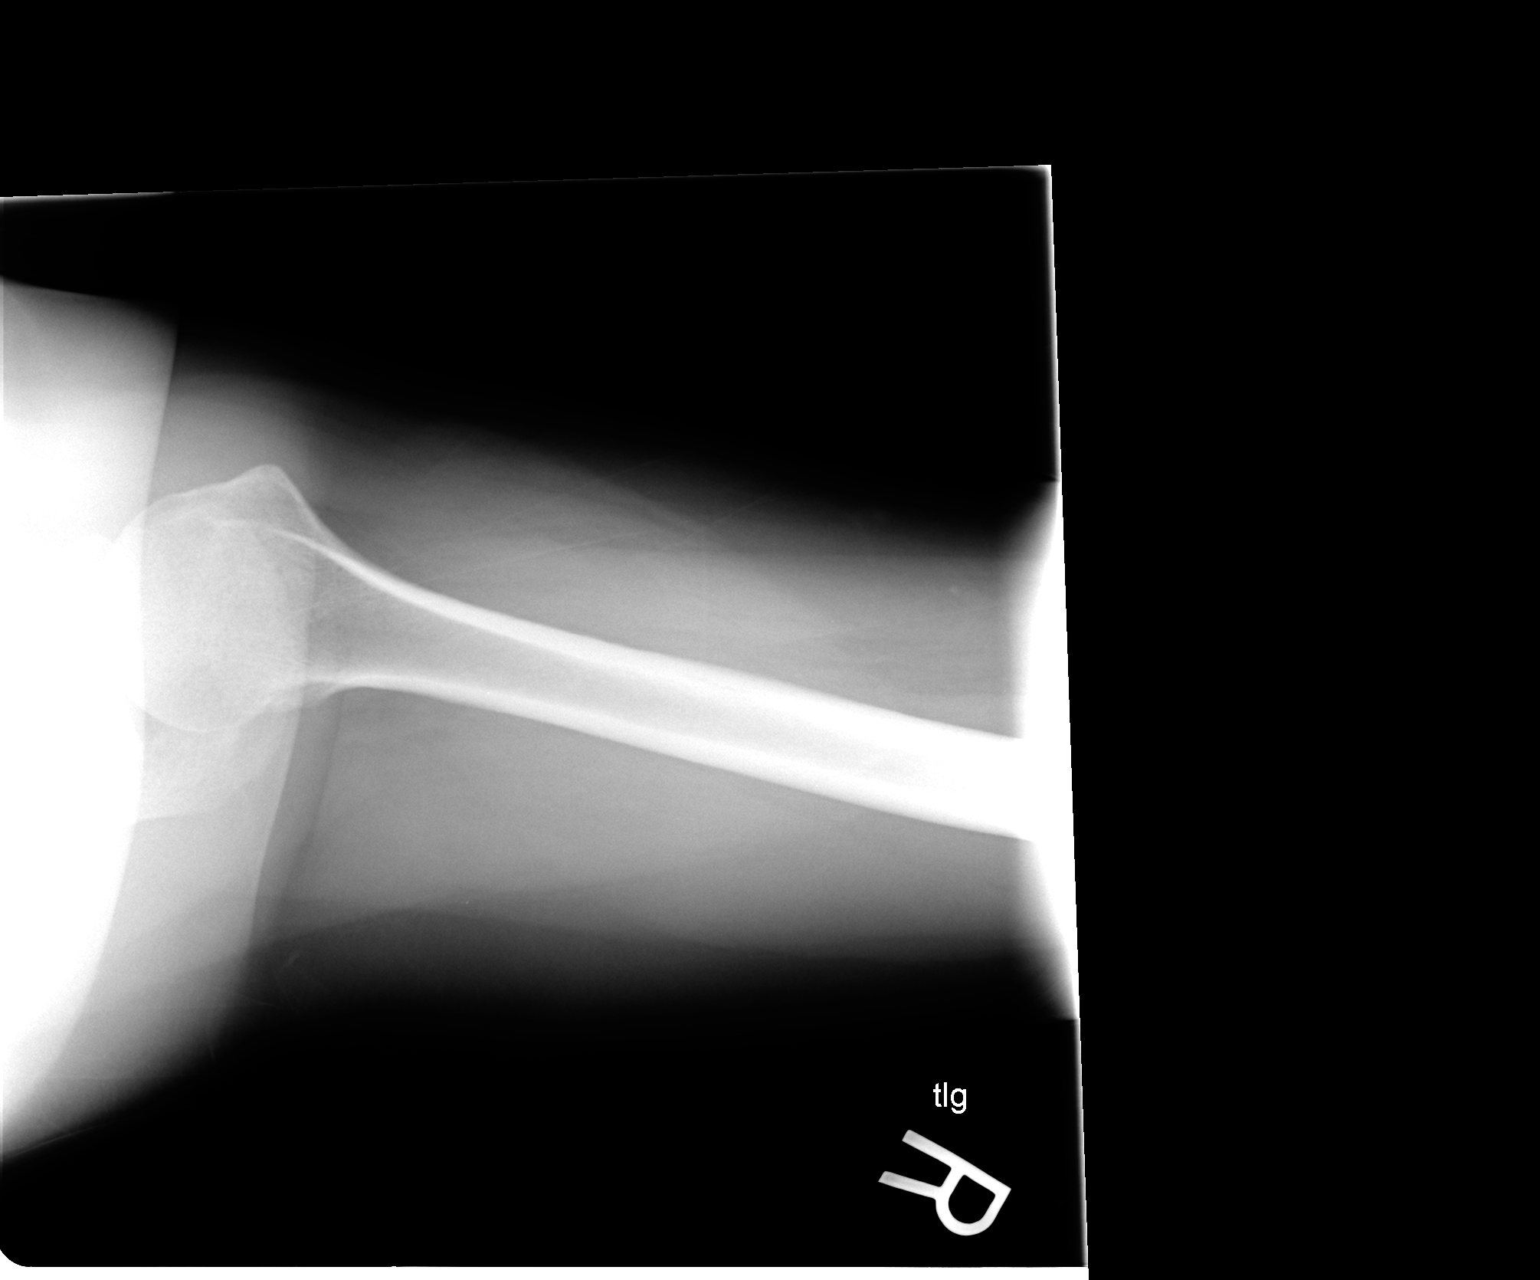

[3 of 3 positions shown; findings below may reference images not displayed]

FINDINGS: The right glenohumeral joint space appears normal.  No
fracture or dislocation is seen.  The right AC joint is normal.  A
healed fracture of the right posterolateral sixth rib is noted.
IMPRESSION: Negative right shoulder.  Old fracture of the right posterolateral
sixth rib.

## 2010-05-23 ENCOUNTER — Telehealth: Payer: Self-pay | Admitting: Family Medicine

## 2010-05-24 ENCOUNTER — Encounter: Payer: Self-pay | Admitting: Family Medicine

## 2010-05-24 IMAGING — CT CT PELVIS W/O CM
2 of 4 series · 17 of 46 positions shown, 19 images · non-contrast
Comparison: 10/09/2008

CT ABDOMEN

CLINICAL DATA: Kidney stones.  Right upper quadrant pain.  Pain
with urination.

CT ABDOMEN AND PELVIS WITHOUT CONTRAST
TECHNIQUE: Multidetector CT imaging of the abdomen and pelvis was
performed following the standard protocol without intravenous
contrast.

[Series 2: stone_wo 5.0 b40f st · axial · 0.67mm/px · z∈[-466,-60]mm · 14 of 89 slices shown, 16 images]
[im 4/89  soft-tissue]
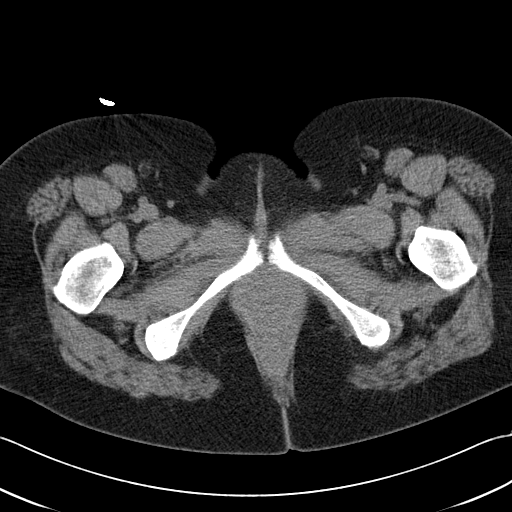
[im 4/89  bone]
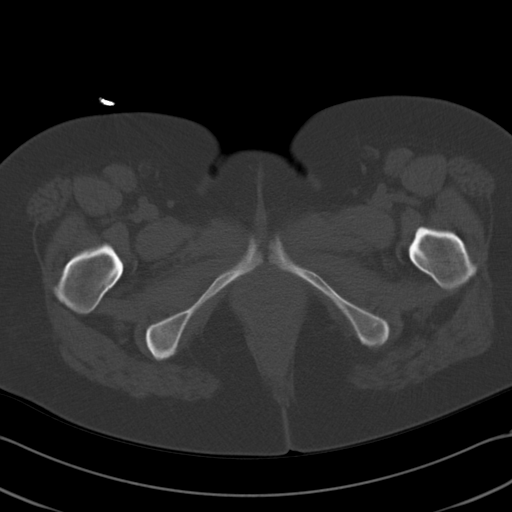
[im 12/89  soft-tissue]
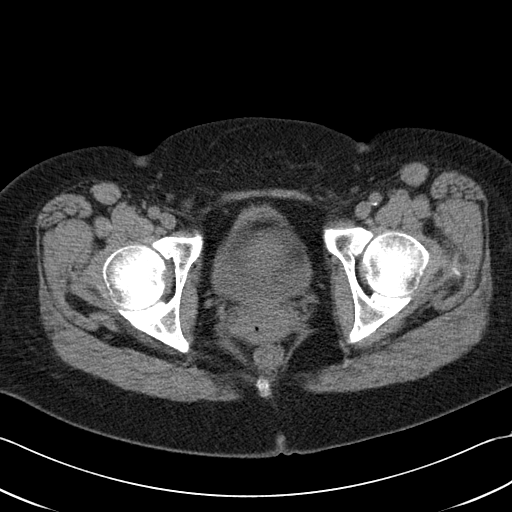
[im 19/89  soft-tissue]
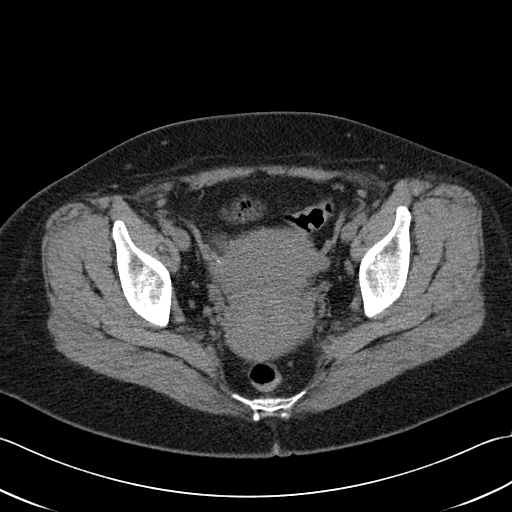
[im 23/89  soft-tissue]
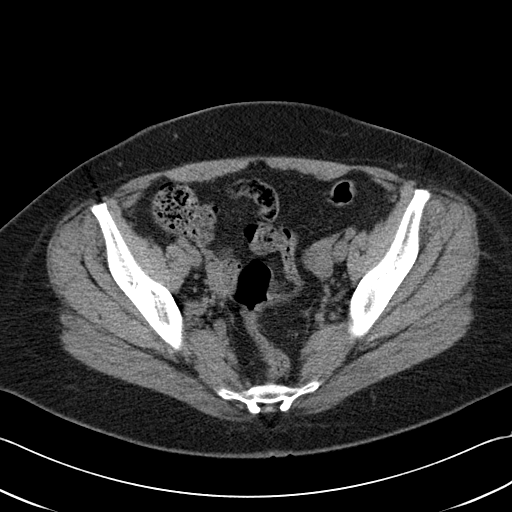
[im 30/89  soft-tissue]
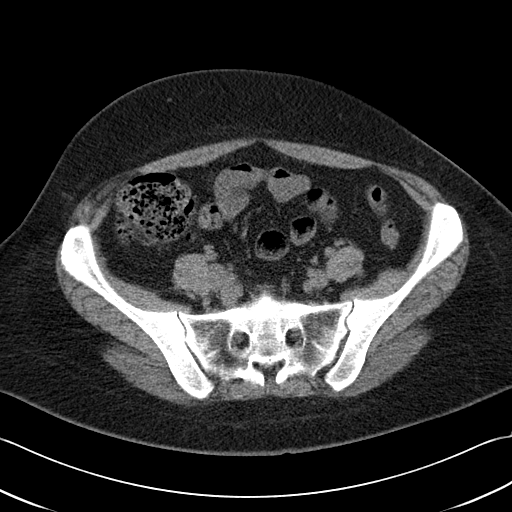
[im 37/89  soft-tissue]
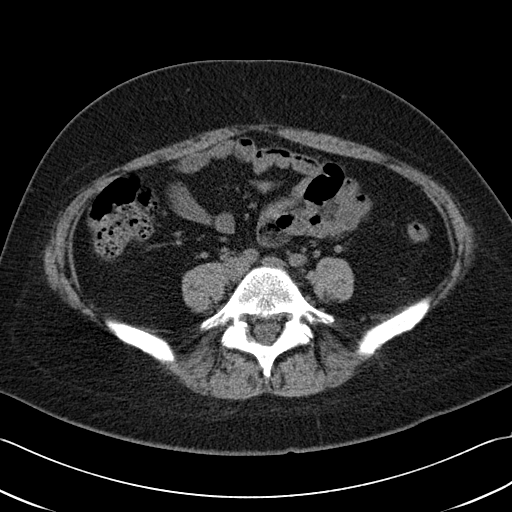
[im 41/89  soft-tissue]
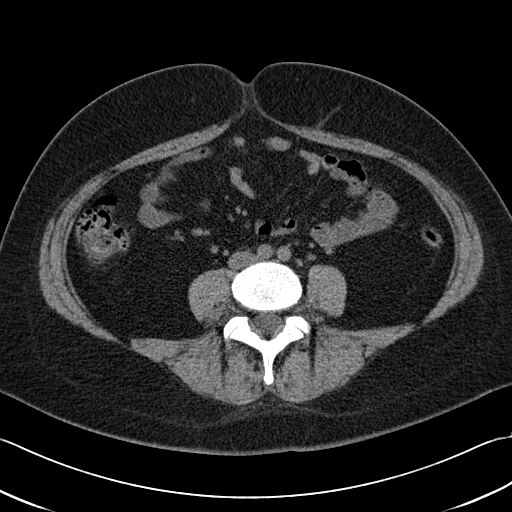
[im 48/89  soft-tissue]
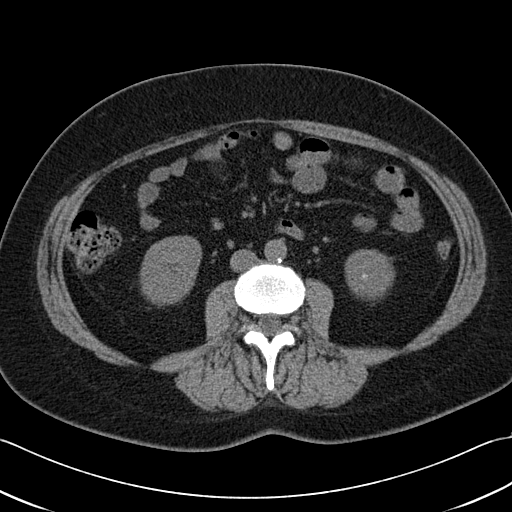
[im 52/89  soft-tissue]
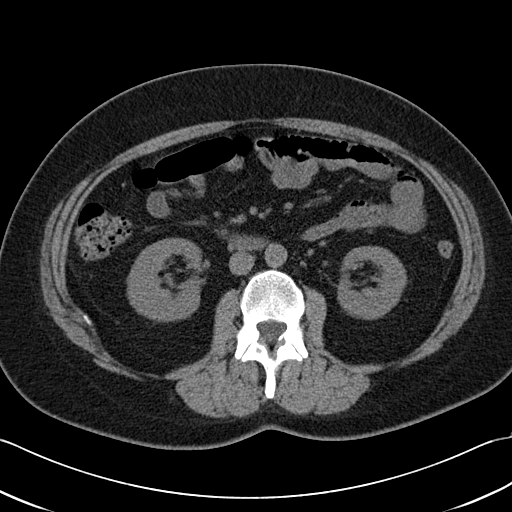
[im 52/89  bone]
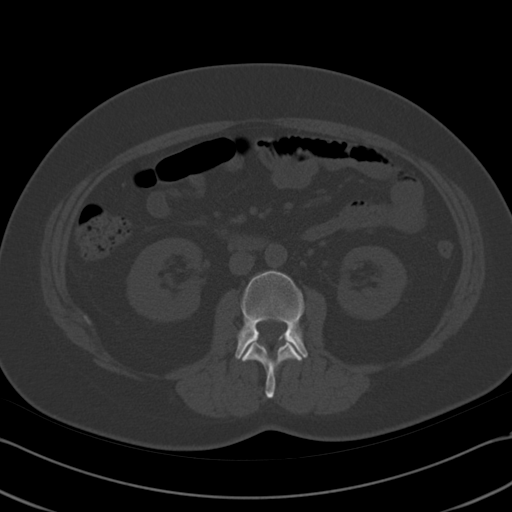
[im 59/89  soft-tissue]
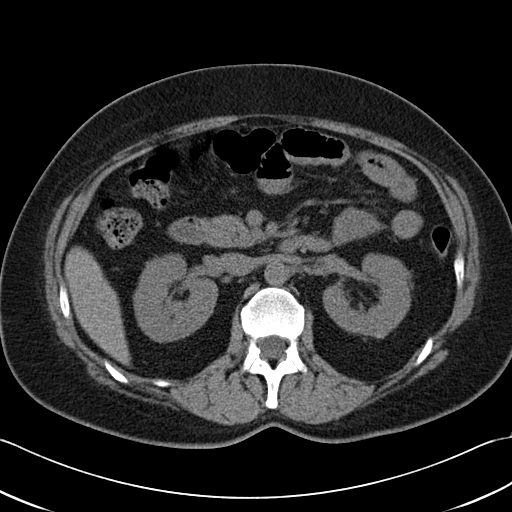
[im 67/89  soft-tissue]
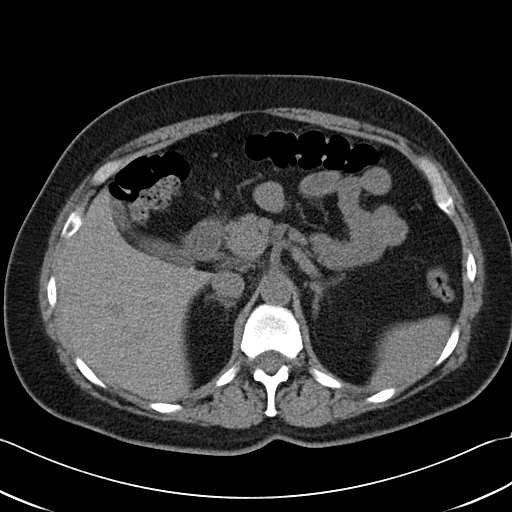
[im 70/89  soft-tissue]
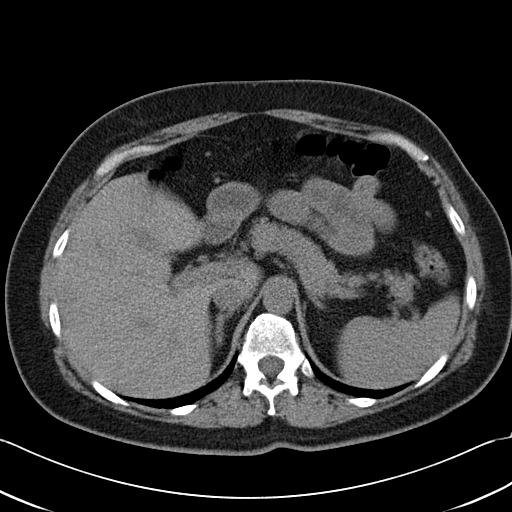
[im 78/89  soft-tissue]
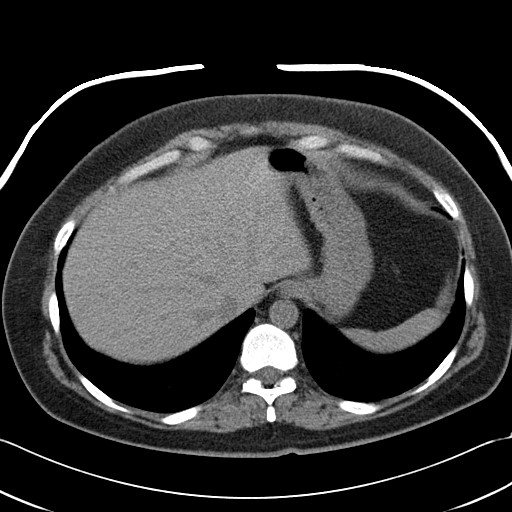
[im 85/89  soft-tissue]
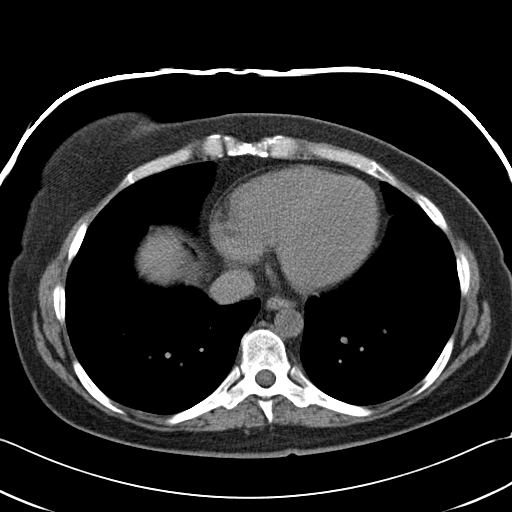

[Series 602: coronal abdomen · coronal · 0.90mm/px · 3 of 115 slices shown]
[im 39/115  soft-tissue]
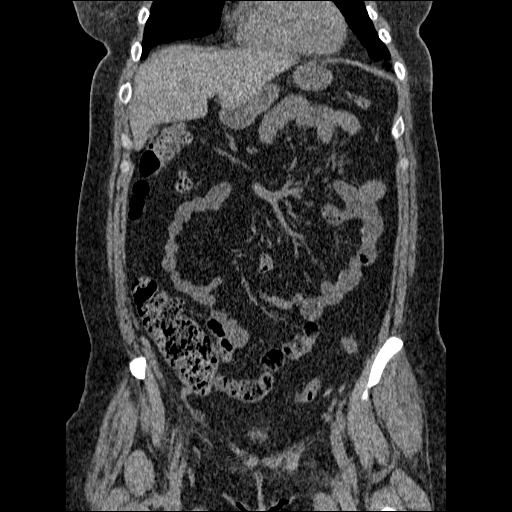
[im 51/115  soft-tissue]
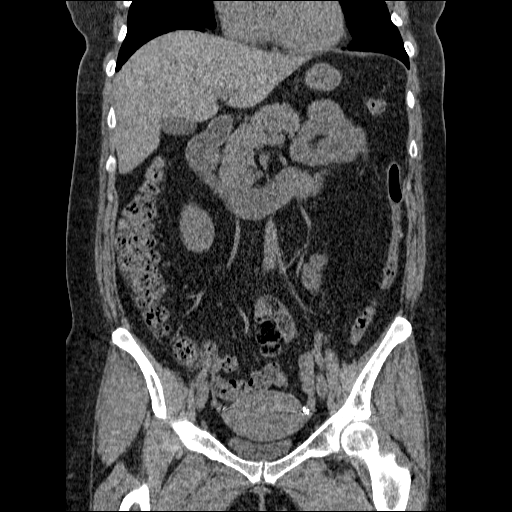
[im 64/115  soft-tissue]
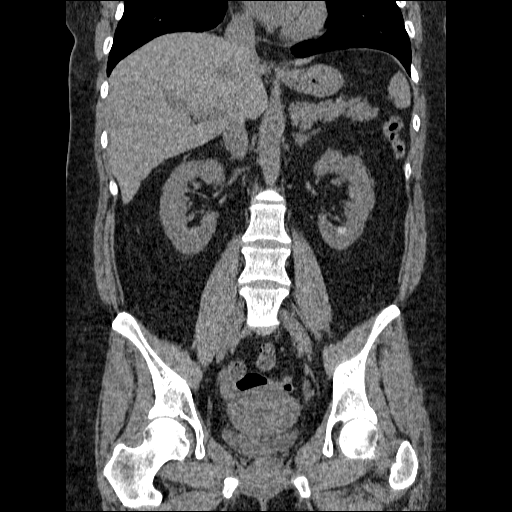

[17 of 46 positions shown; findings below may reference images not displayed]

FINDINGS: The lung bases are clear.

Spleen negative.

Adrenal glands negative.

Pancreas appears normal.

There is a low density structure within the right hepatic lobe of
the liver.  This is unchanged from prior examination.

No new liver abnormalities noted.

The gallbladder is negative.  There is no biliary ductal
dilatation.

No enlarged upper abdominal lymph nodes.

There is no free fluid or abnormal fluid collections.

The bowel loops are normal in their course and caliber.

There is a hyper attenuating lesion within the inferior pole
collecting system of the left kidney measuring 7.2 mm, image 41.
This is incompletely characterized without IV contrast material.

No left-sided renal calculi or obstructive uropathy.
IMPRESSION: 1.  No acute upper abdominal CT findings.  There is no evidence for
renal calculi or obstructive uropathy.
2.  Stable low density structure within the right hepatic lobe.
3.  Hyperattenuating lesion within the inferior pole collecting
system of the left kidney is incompletely characterized without IV
contrast.

CT PELVIS
FINDINGS: Visualized colon and small bowel are unremarkable.

No free fluid or abnormal fluid collections.

No significant lymphadenopathy.

Urinary bladder is normal.
IMPRESSION: 1.  No acute pelvic CT findings.

## 2010-05-25 ENCOUNTER — Encounter: Payer: Self-pay | Admitting: Family Medicine

## 2010-05-25 ENCOUNTER — Telehealth: Payer: Self-pay | Admitting: Family Medicine

## 2010-06-07 ENCOUNTER — Telehealth: Payer: Self-pay | Admitting: Family Medicine

## 2010-06-07 ENCOUNTER — Ambulatory Visit: Payer: Self-pay | Admitting: Family Medicine

## 2010-06-08 ENCOUNTER — Encounter: Payer: Self-pay | Admitting: Family Medicine

## 2010-06-08 ENCOUNTER — Emergency Department (HOSPITAL_BASED_OUTPATIENT_CLINIC_OR_DEPARTMENT_OTHER): Admission: EM | Admit: 2010-06-08 | Discharge: 2010-06-08 | Payer: Self-pay | Admitting: Emergency Medicine

## 2010-06-08 LAB — CONVERTED CEMR LAB
Trich, Wet Prep: NONE SEEN
Yeast Wet Prep HPF POC: NONE SEEN

## 2010-06-10 IMAGING — US US ABDOMEN COMPLETE
1 series · 14 of 25 positions shown · non-contrast
Comparison: CT abdomen pelvis 02/28/2009

CLINICAL DATA: Right upper quadrant pain.

COMPLETE ABDOMINAL ULTRASOUND

[Series 1: us abdomen complete · 0.32mm/px · 14 of 76 slices shown]
[im 1/76]
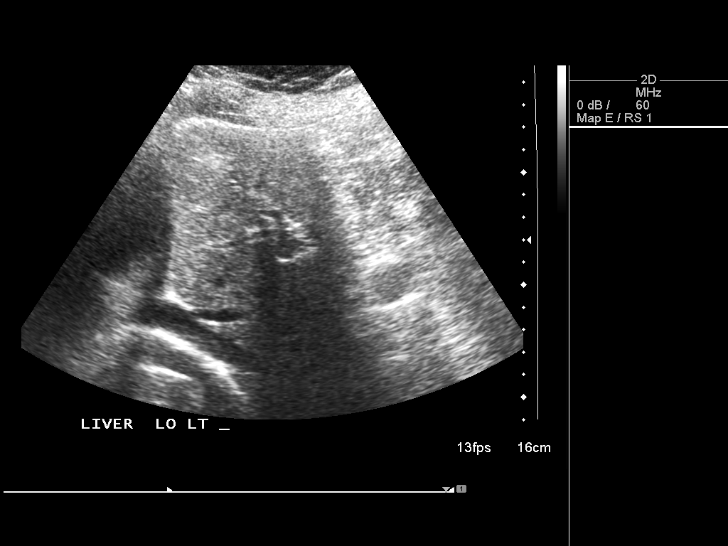
[im 7/76]
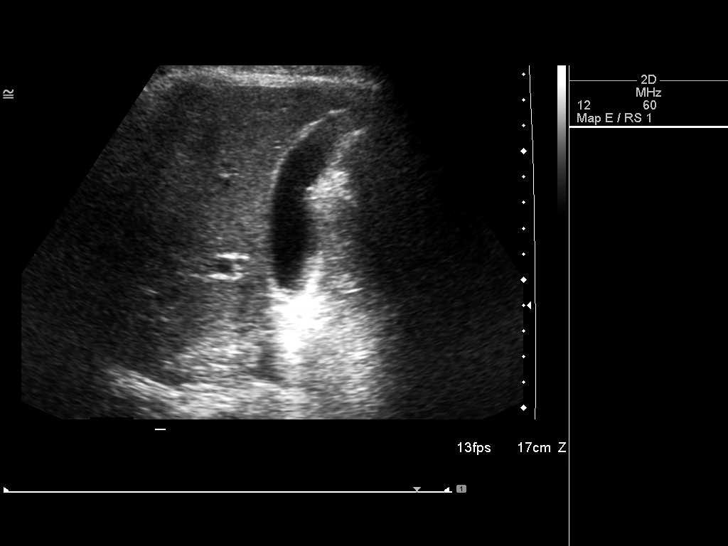
[im 13/76]
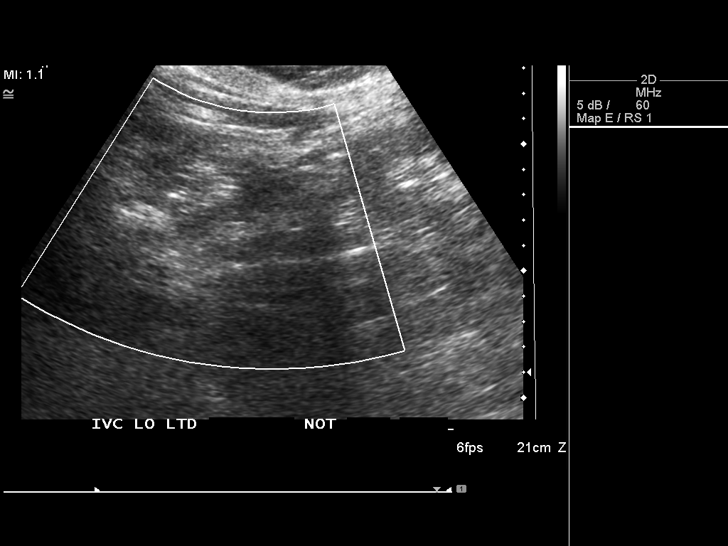
[im 19/76]
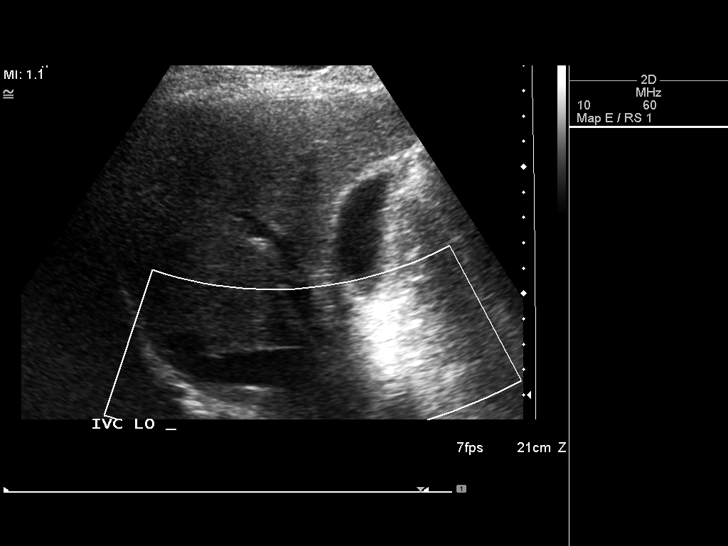
[im 26/76]
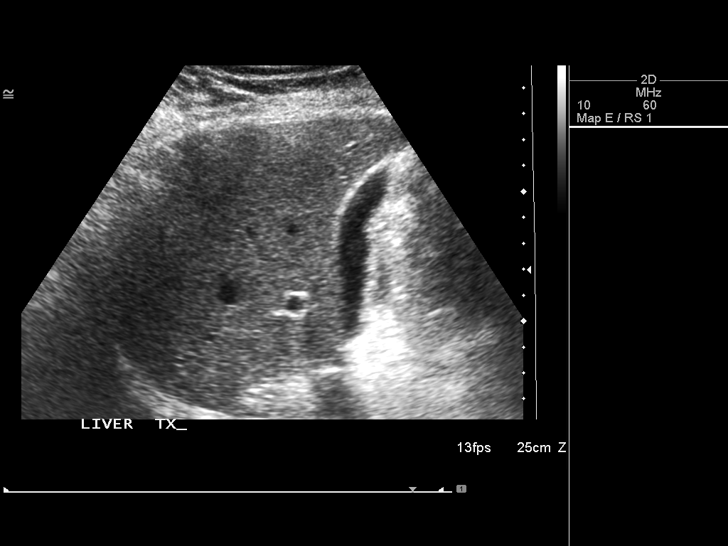
[im 29/76]
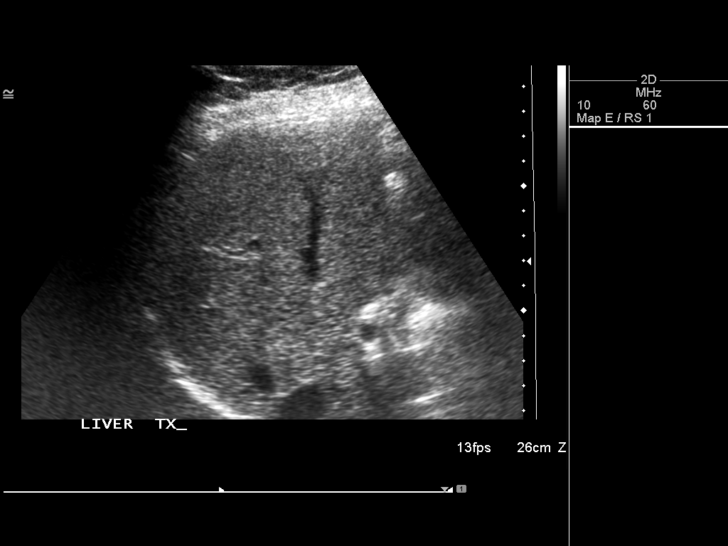
[im 35/76]
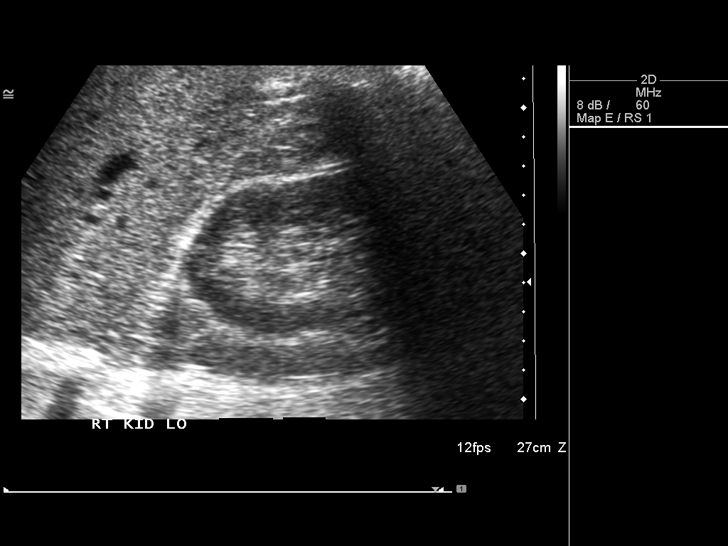
[im 41/76]
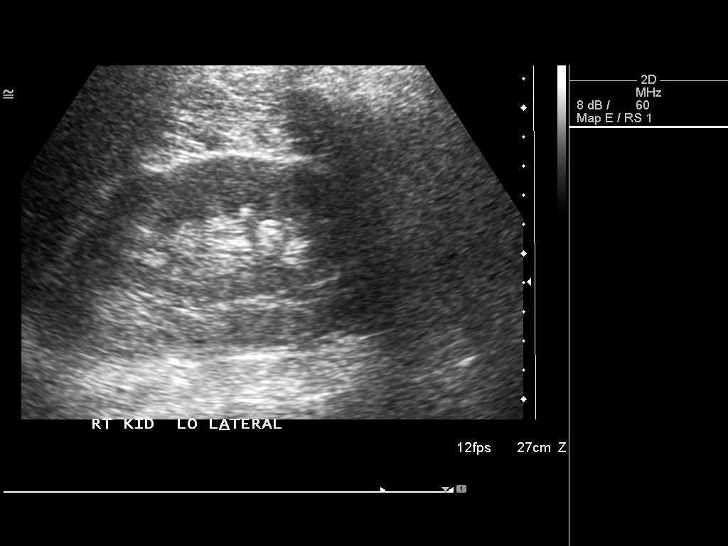
[im 47/76]
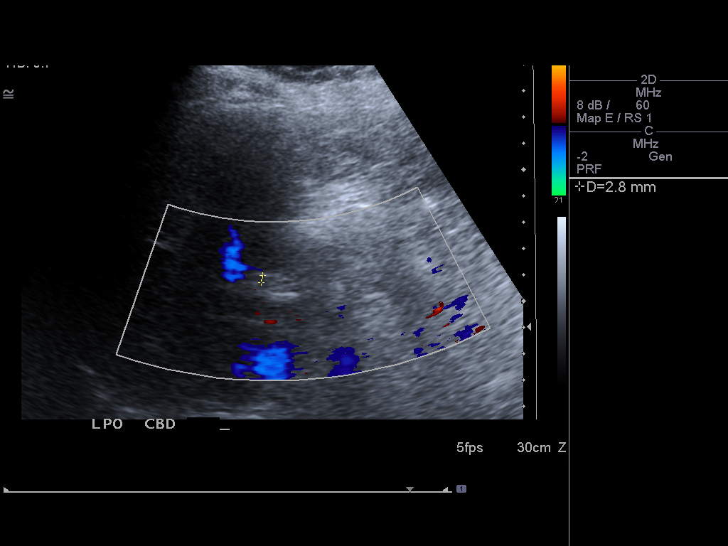
[im 51/76]
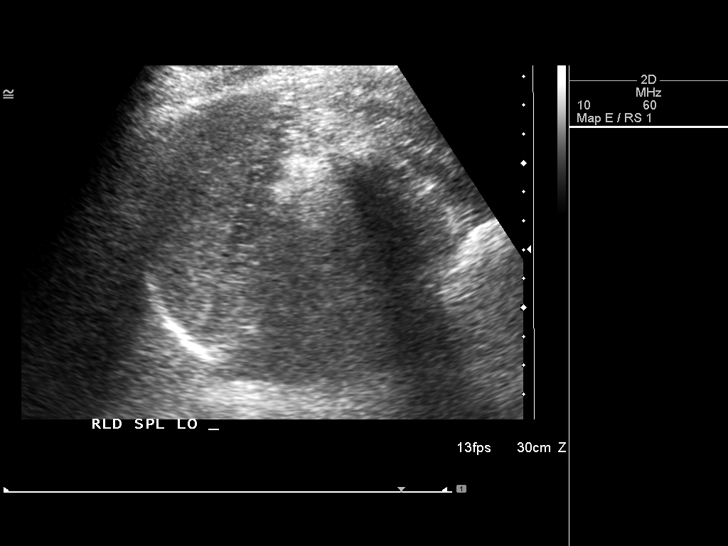
[im 57/76]
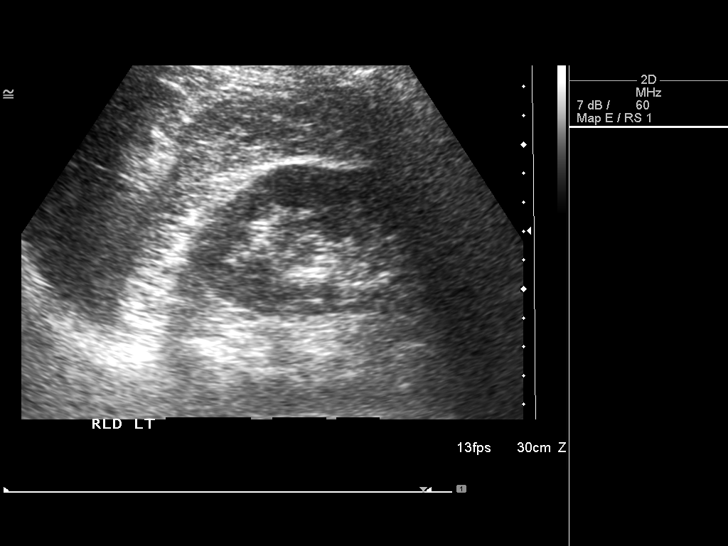
[im 63/76]
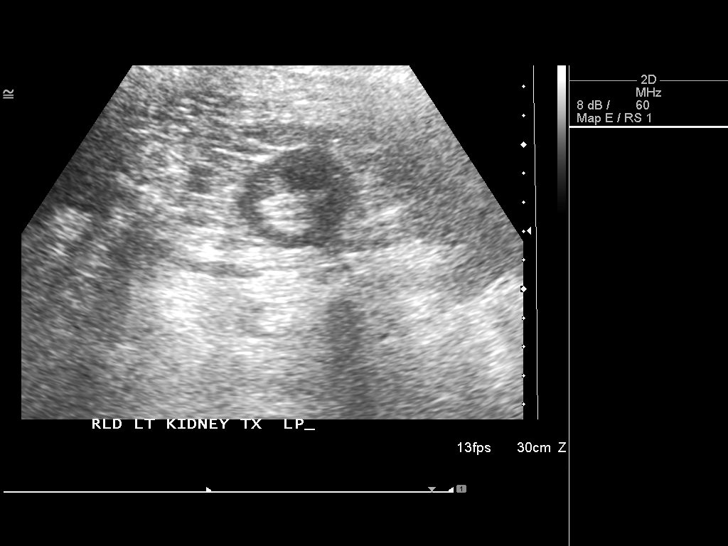
[im 69/76]
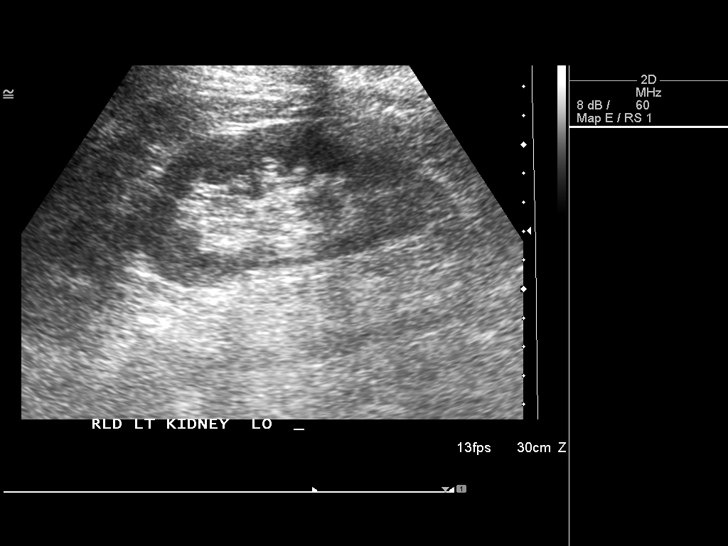
[im 76/76]
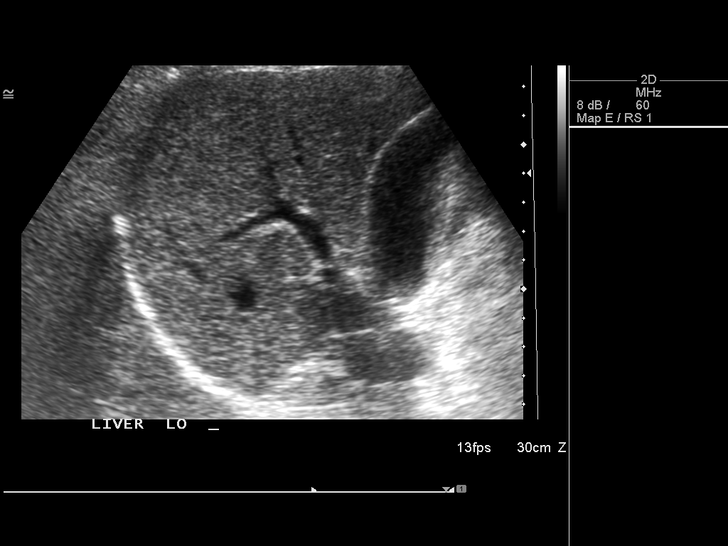

[14 of 25 positions shown; findings below may reference images not displayed]

FINDINGS: Gallbladder:  Negative.

Common bile duct:  3 mm.  Visualization is limited by bowel gas.

Liver:  Negative.

IVC:  Visualization is limited by bowel gas.

Pancreas:  Visualization is limited by bowel gas.

Spleen:  5 cm, within normal limits.

Right Kidney:  10.9 cm, negative.

Left Kidney:  10.8 cm, negative.

Abdominal aorta:  Visualization is limited by bowel gas.  Measures
2 cm.

Comment:  The study was technically difficult due to patient
tenderness and bowel gas.
IMPRESSION: 1.  Study was somewhat limited by patient tenderness and bowel gas.
2.  No acute findings.

## 2010-06-13 ENCOUNTER — Encounter: Payer: Self-pay | Admitting: Family Medicine

## 2010-06-15 ENCOUNTER — Encounter: Payer: Self-pay | Admitting: Family Medicine

## 2010-06-15 IMAGING — CR DG CHEST 2V
2 series · 2 of 2 positions shown · non-contrast
Comparison: Chest radiograph 02/03/2009

CLINICAL DATA: Sore throat and vomiting

CHEST - 2 VIEW

[w chest pa]
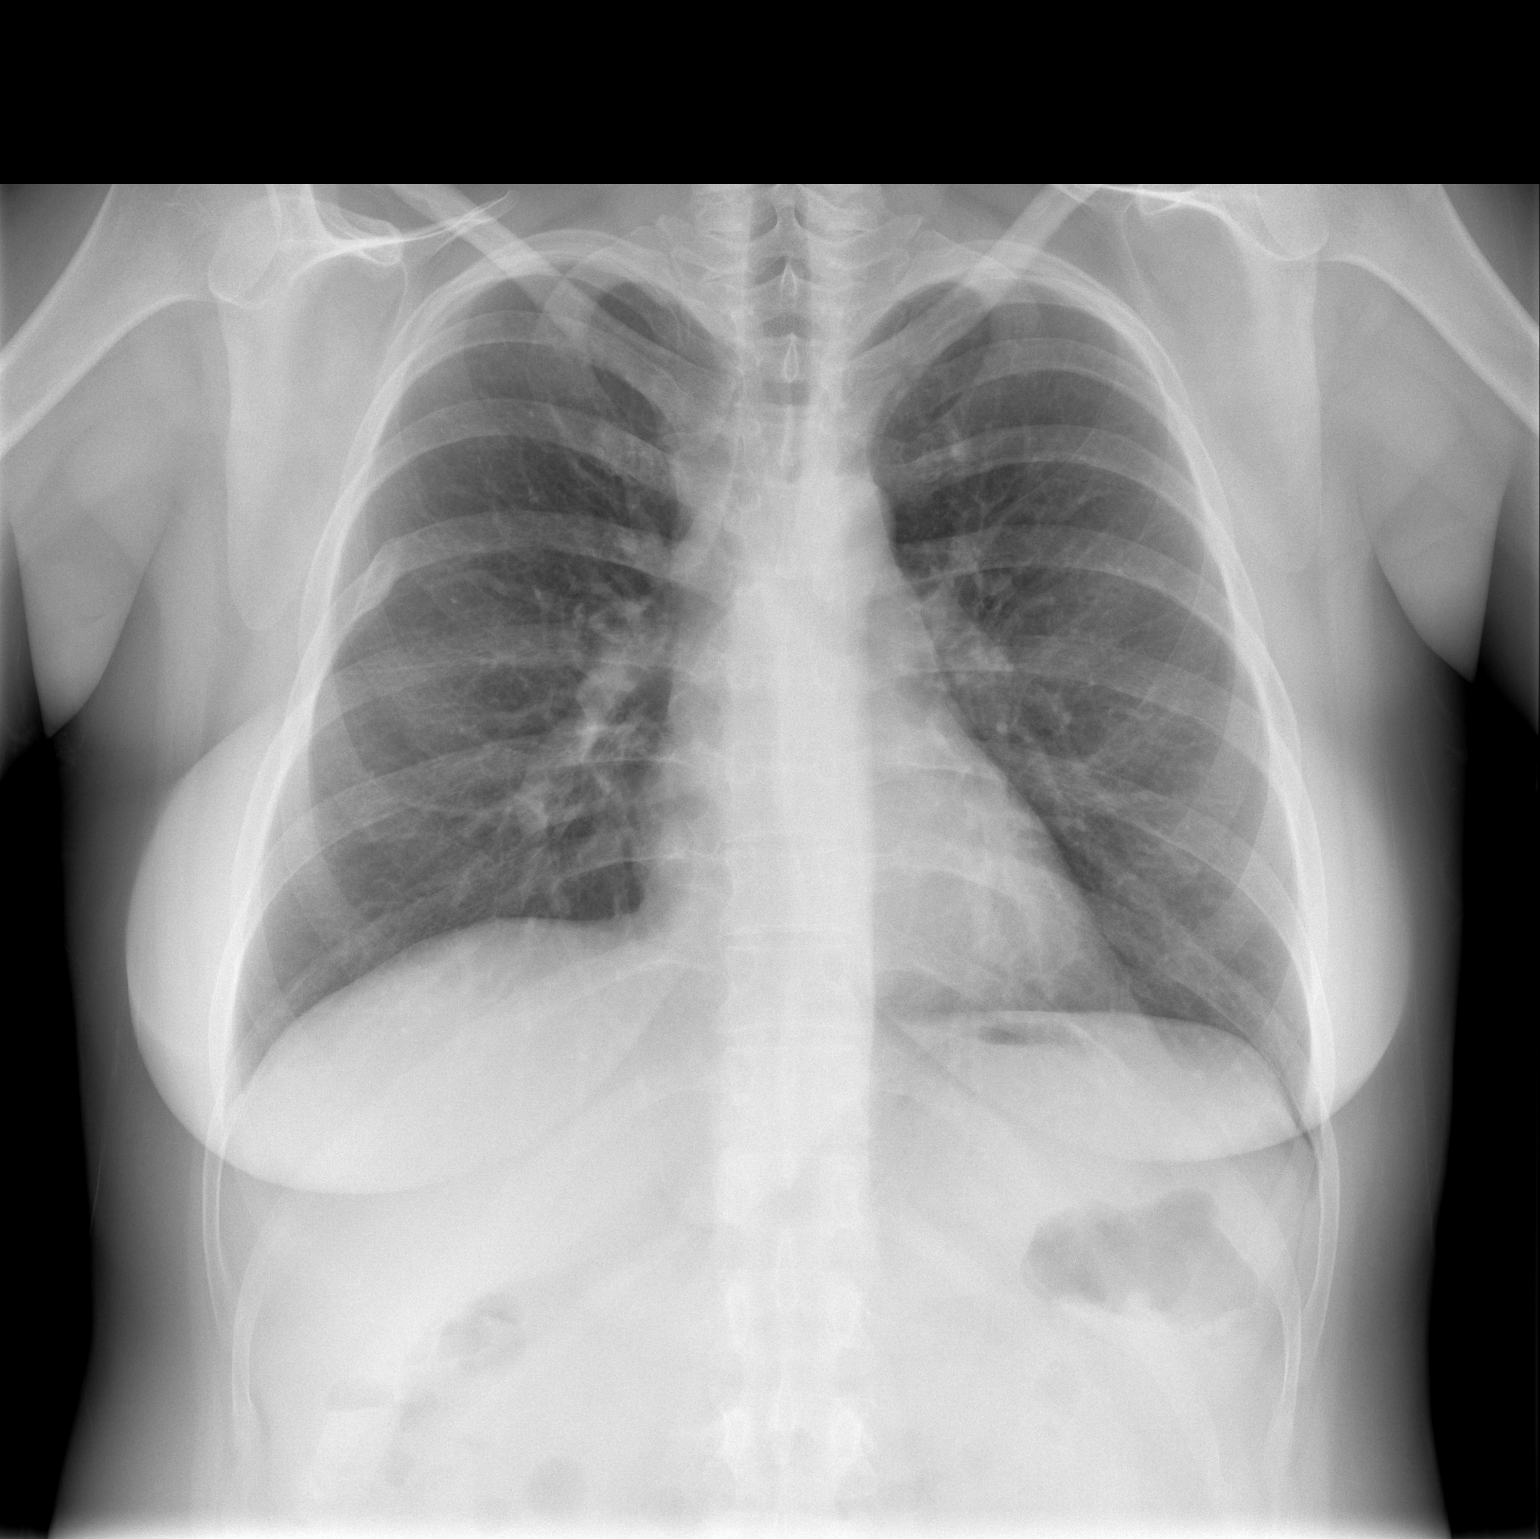

[w chest lat]
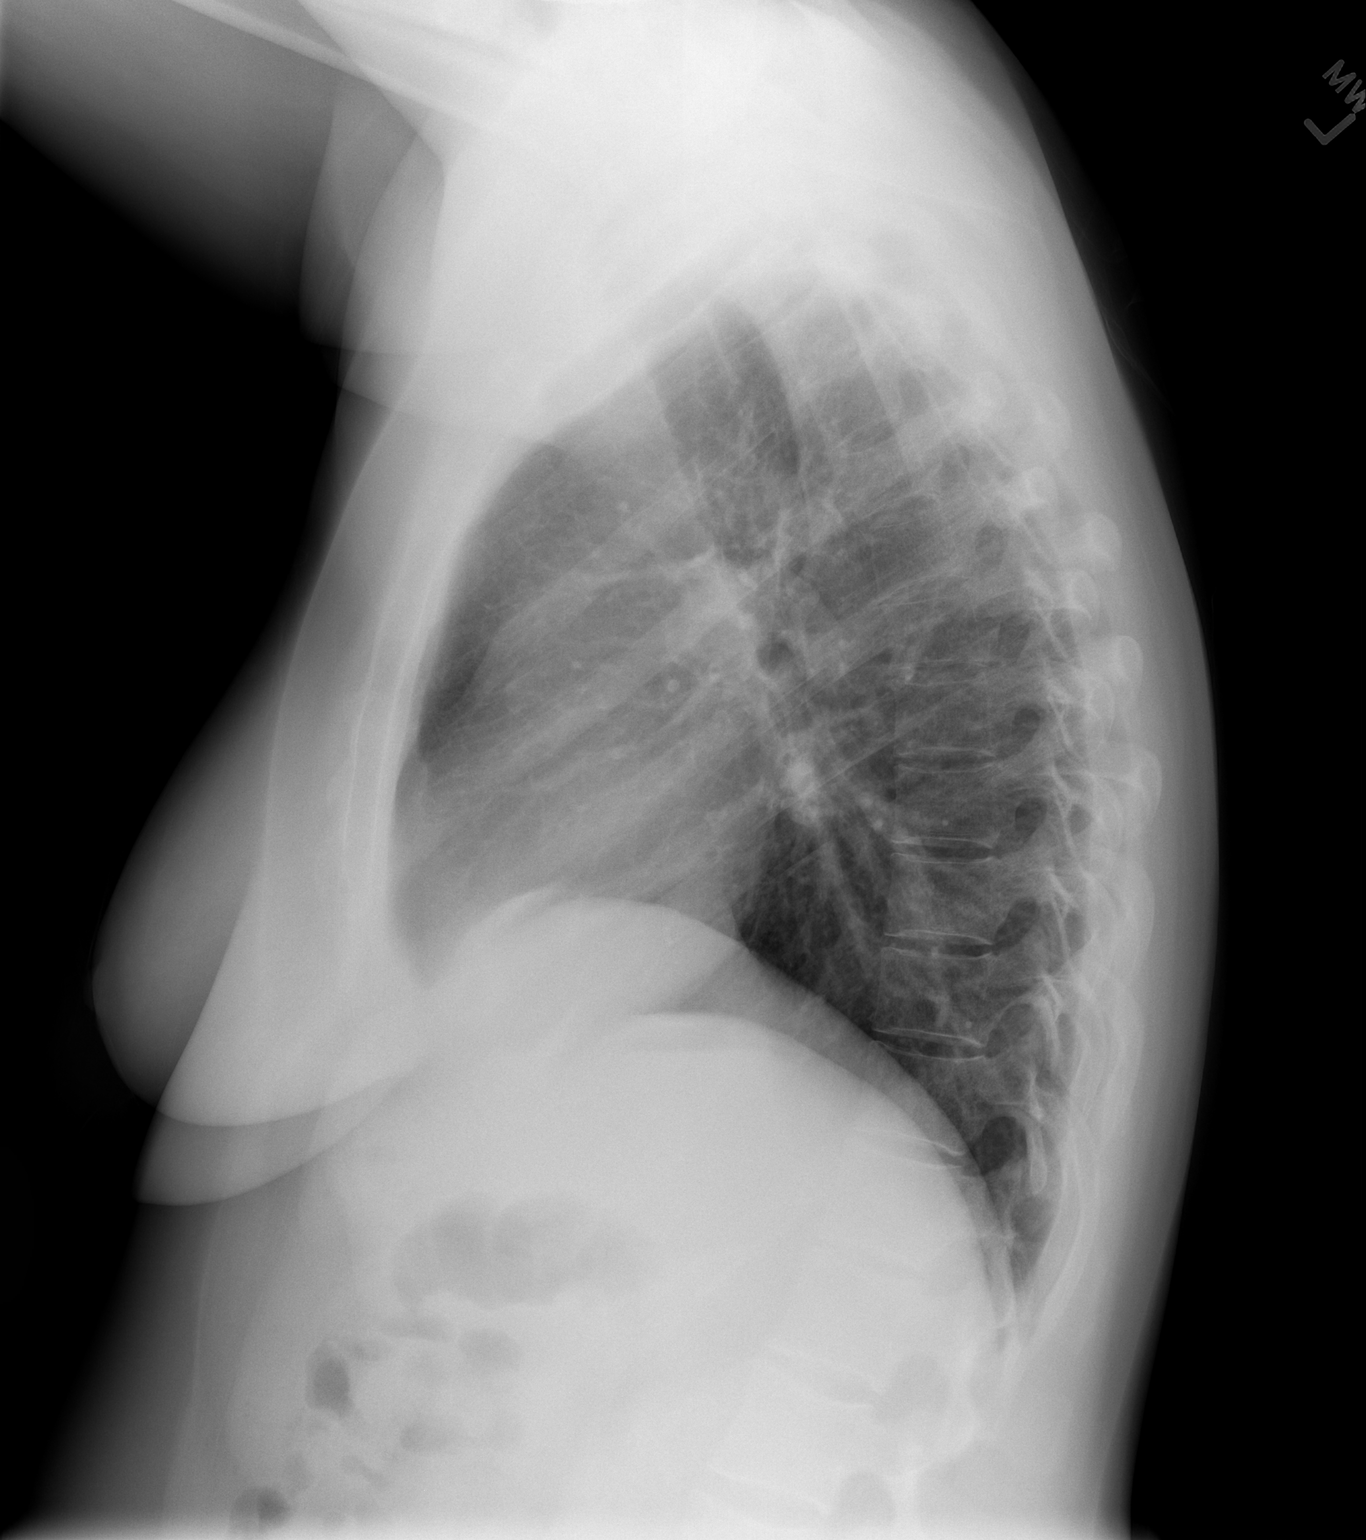

[2 of 2 positions shown; findings below may reference images not displayed]

FINDINGS: Normal mediastinum and heart silhouette.  Costophrenic
angles are clear.  There is mild central bronchitic changes of the
prior.  Remote right rib fracture noted.
IMPRESSION: Central bronchitic markings could represent bronchitis or
bronchiolitis from smoking.

## 2010-06-20 ENCOUNTER — Encounter: Payer: Self-pay | Admitting: Family Medicine

## 2010-06-20 LAB — HM MAMMOGRAPHY: HM Mammogram: NORMAL

## 2010-06-22 ENCOUNTER — Encounter: Payer: Self-pay | Admitting: Family Medicine

## 2010-06-23 IMAGING — CR DG PELVIS 1-2V
1 series · 1 of 1 positions shown · non-contrast
Comparison: [HOSPITAL] abdominal pelvic CT 02/28/2009.

Addendum Begins
CLINICAL DATA: The patient telephoned with correction of no recent
fall injury.  Sudden distinct pain over pubic symphysis, right
groin pain/right upper thigh pain for 2 days.

Addendum Ends
CLINICAL DATA: Fall injury 1 week ago with pubic, right groin/right
upper thigh pain for 2 days.  History tubal ligation.
PELVIS - 1-2 VIEW

[view not recorded]
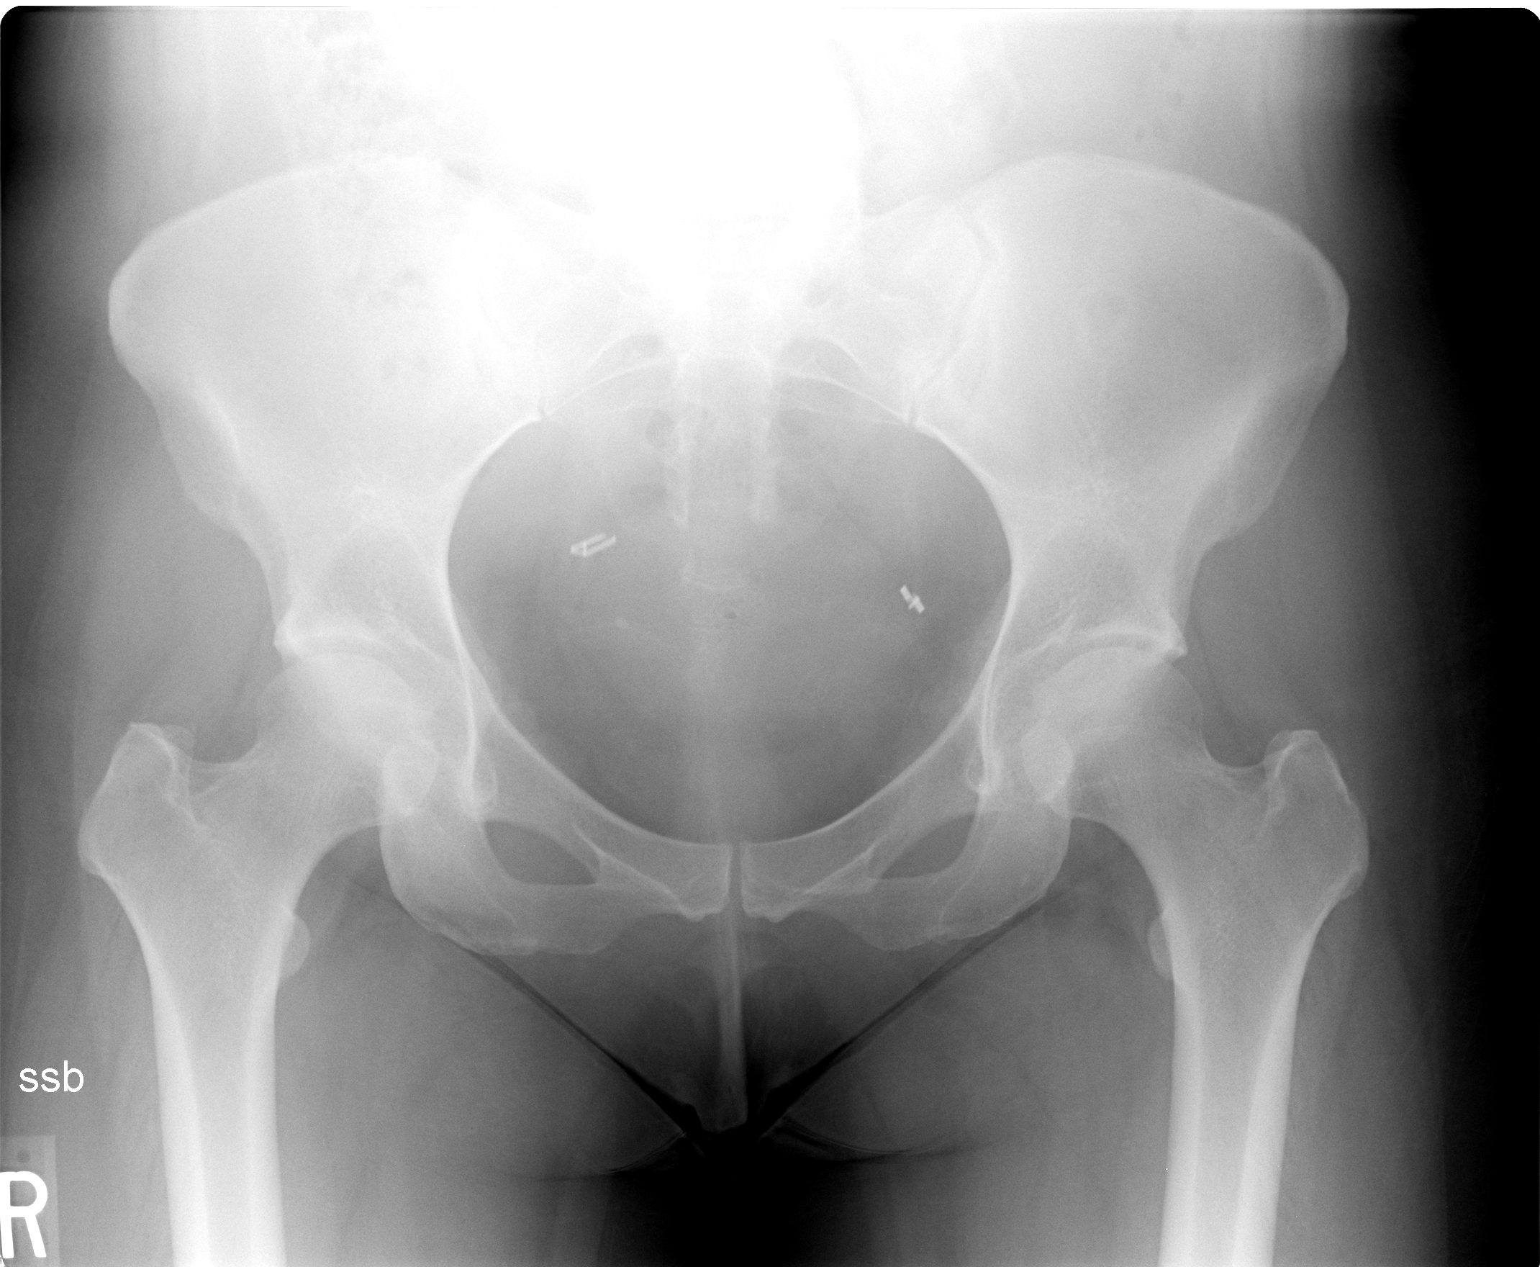

[1 of 1 positions shown; findings below may reference images not displayed]

FINDINGS: Bony pelvis appears intact without fracture, dislocation,
subluxation.  Bilateral sacroiliac joints, hip joints, and pelvic
tubal ligation surgical clips are stable.
IMPRESSION: Stable - negative.

## 2010-06-24 ENCOUNTER — Emergency Department (HOSPITAL_COMMUNITY): Admission: EM | Admit: 2010-06-24 | Discharge: 2010-06-24 | Payer: Self-pay | Admitting: Emergency Medicine

## 2010-06-26 ENCOUNTER — Encounter: Payer: Self-pay | Admitting: Family Medicine

## 2010-07-19 ENCOUNTER — Encounter: Payer: Self-pay | Admitting: Family Medicine

## 2010-07-20 ENCOUNTER — Emergency Department (HOSPITAL_BASED_OUTPATIENT_CLINIC_OR_DEPARTMENT_OTHER)
Admission: EM | Admit: 2010-07-20 | Discharge: 2010-07-20 | Payer: Self-pay | Source: Home / Self Care | Admitting: Emergency Medicine

## 2010-07-20 IMAGING — CR DG CHEST 2V
2 series · 2 of 2 positions shown · non-contrast
Comparison: 03/22/2009

CLINICAL DATA: Chest pain

CHEST - 2 VIEW

[w chest pa]
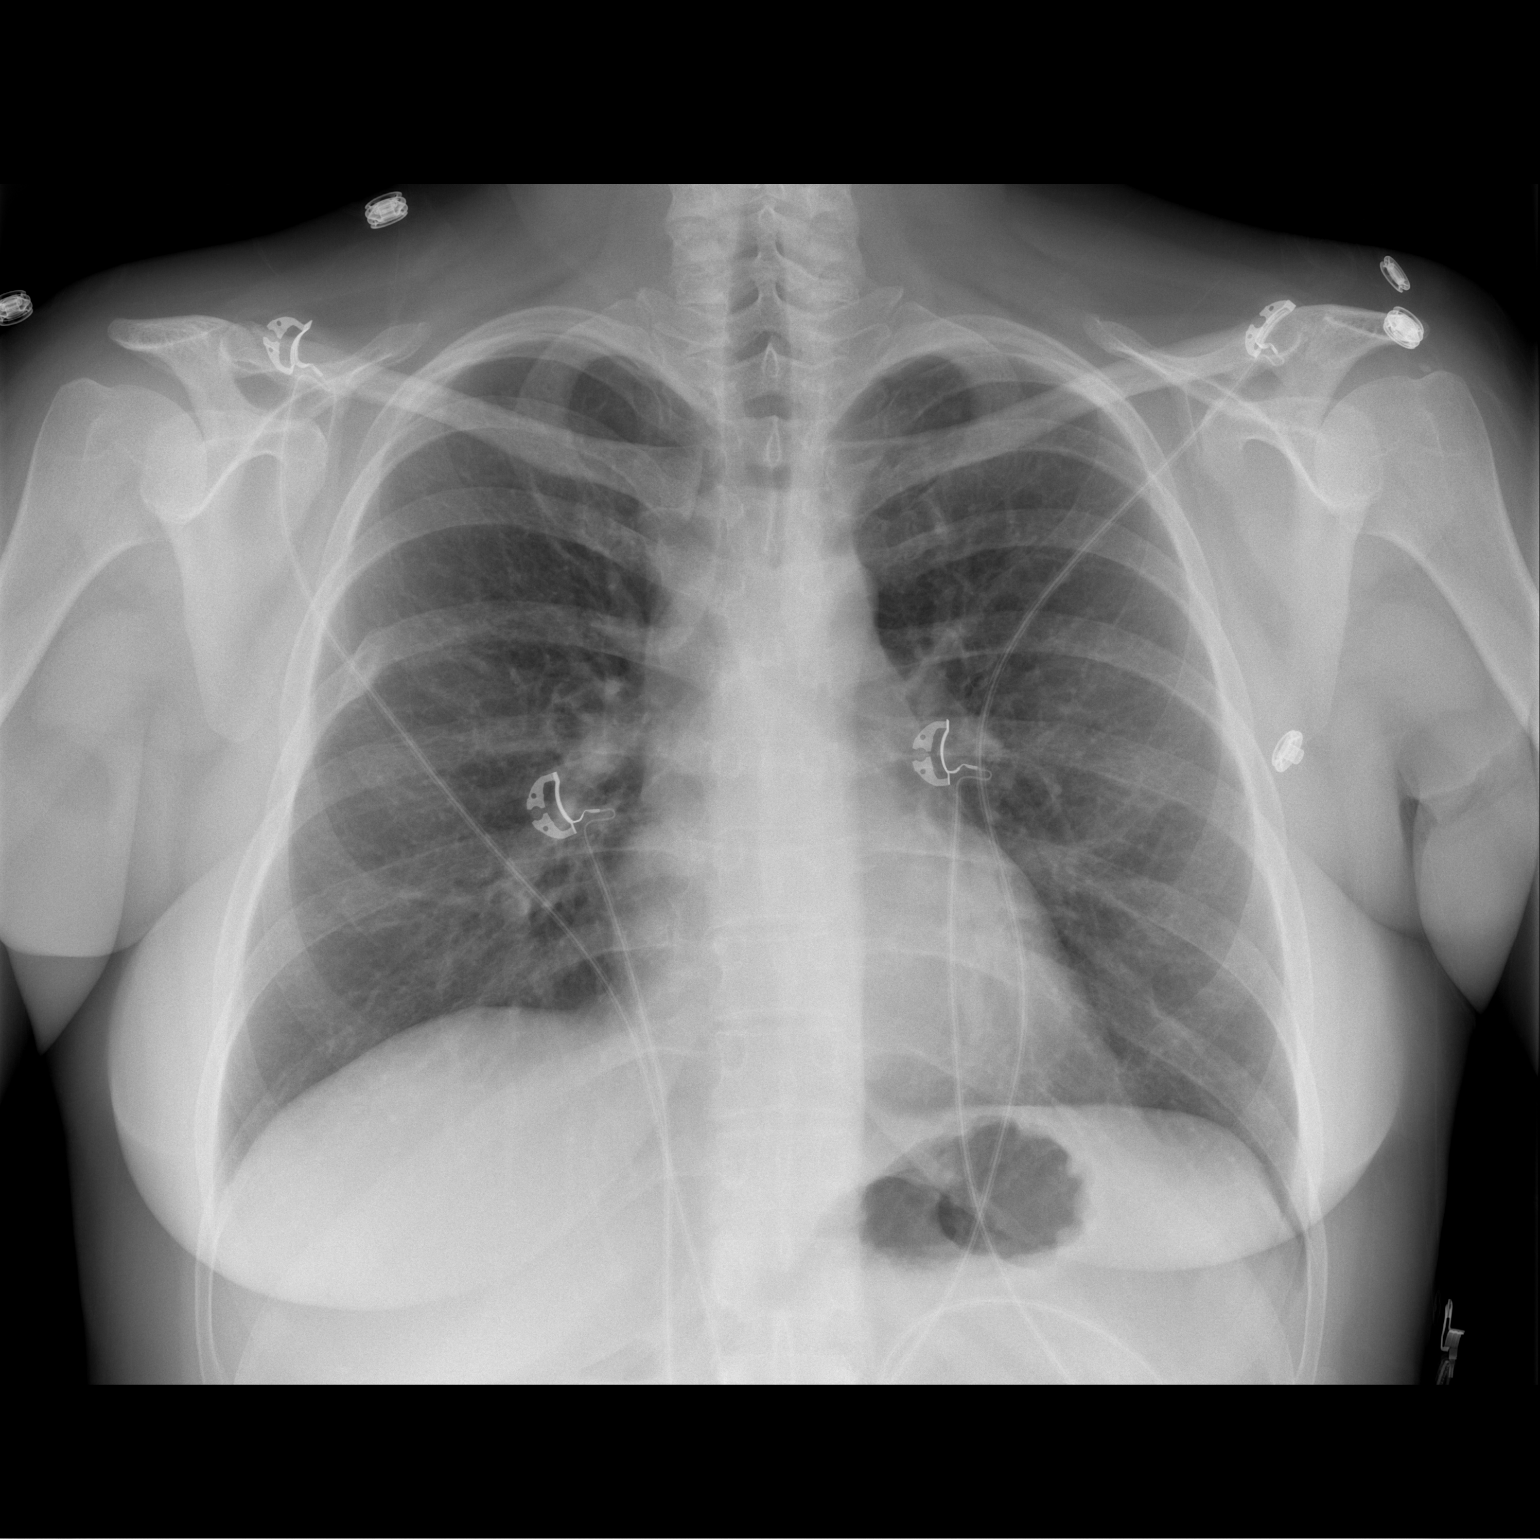

[w chest lat]
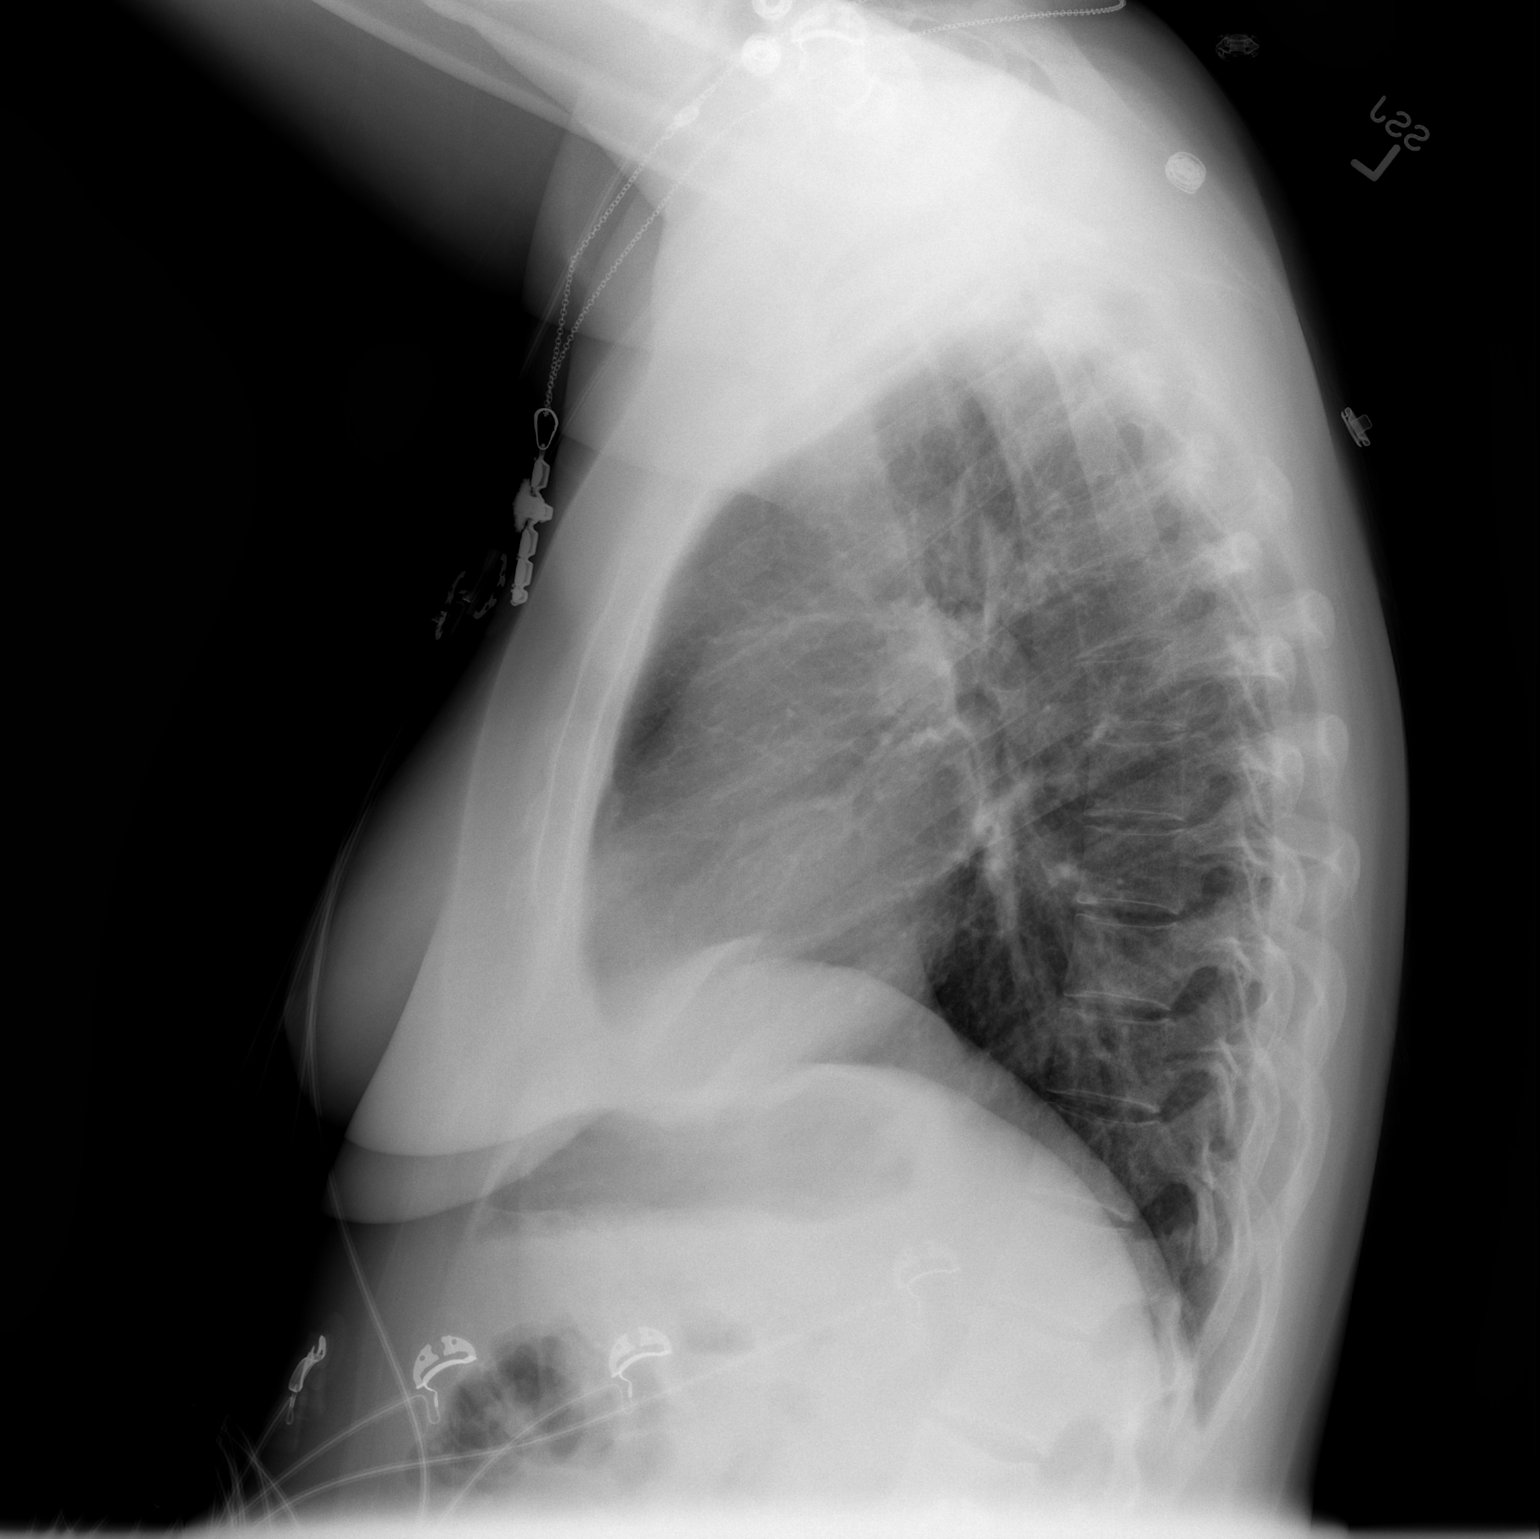

[2 of 2 positions shown; findings below may reference images not displayed]

FINDINGS: Heart and mediastinal contours are within normal limits.
No focal opacities or effusions.  No acute bony abnormality. Old
right rib fracture seen.
IMPRESSION: No acute findings.

## 2010-07-21 ENCOUNTER — Ambulatory Visit: Payer: Self-pay | Admitting: Critical Care Medicine

## 2010-07-25 ENCOUNTER — Ambulatory Visit: Payer: Self-pay | Admitting: Family Medicine

## 2010-07-25 ENCOUNTER — Encounter: Payer: Self-pay | Admitting: Family Medicine

## 2010-08-05 IMAGING — CR DG FOOT COMPLETE 3+V*R*
3 series · 3 of 3 positions shown · non-contrast
Comparison: None

CLINICAL DATA: Dropped object on foot.  Foot pain in metatarsal
region.

RIGHT FOOT COMPLETE - 3+ VIEW

[t foot ap right]
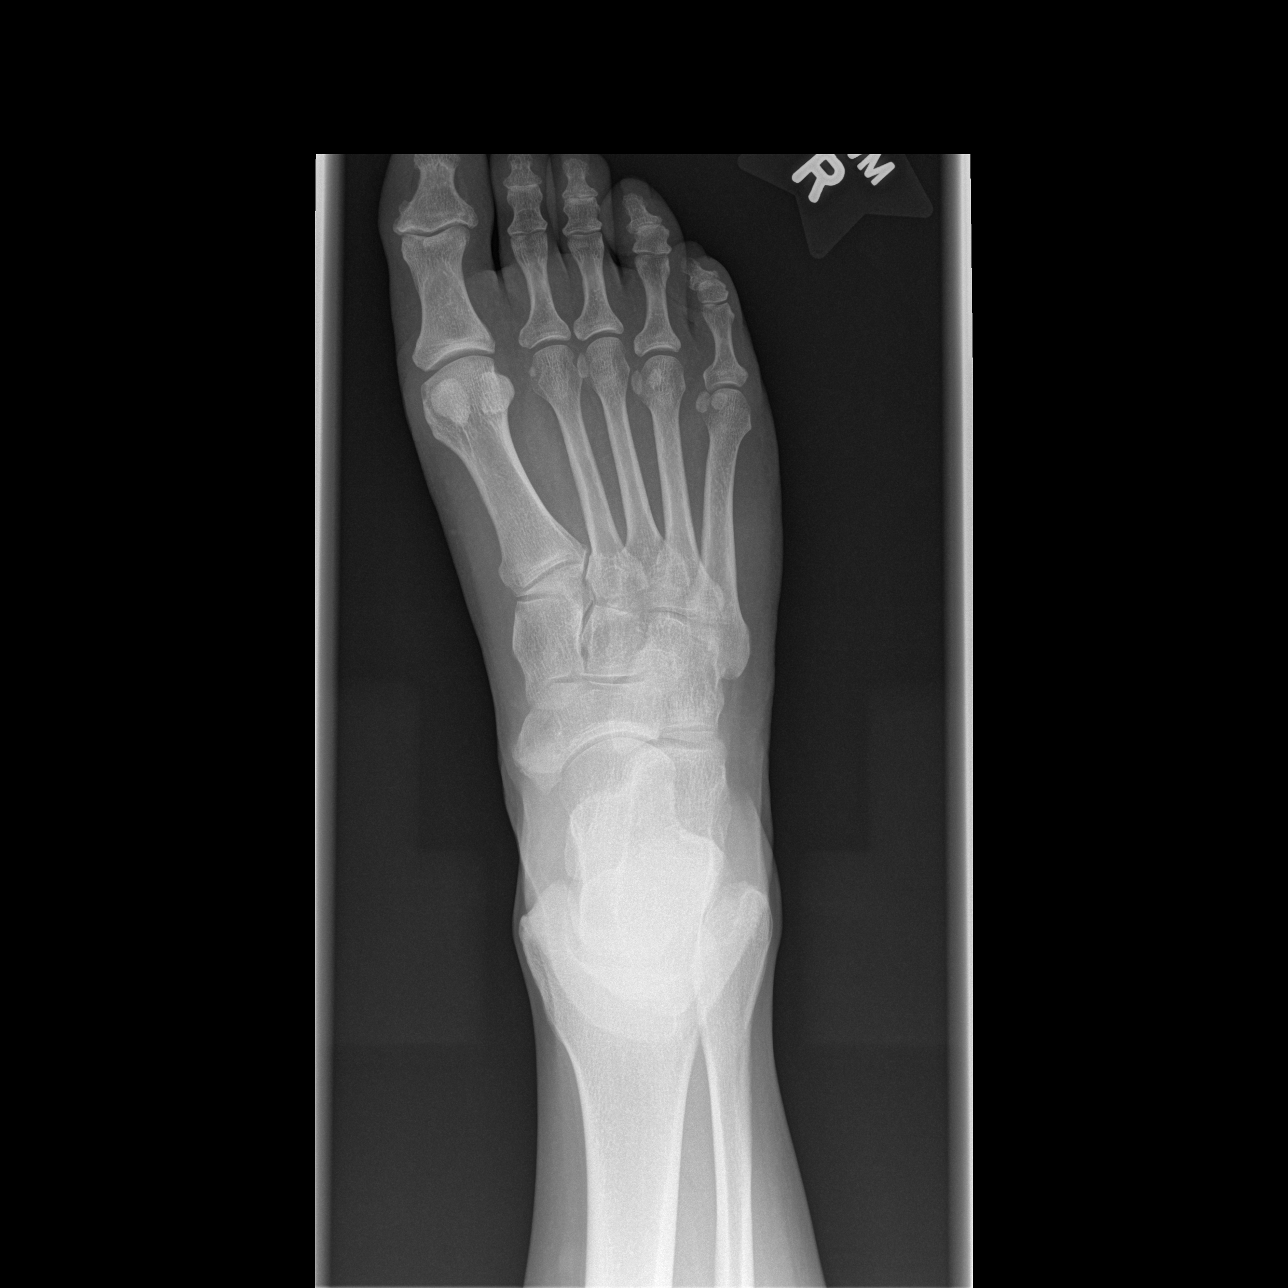

[t foot oblique right]
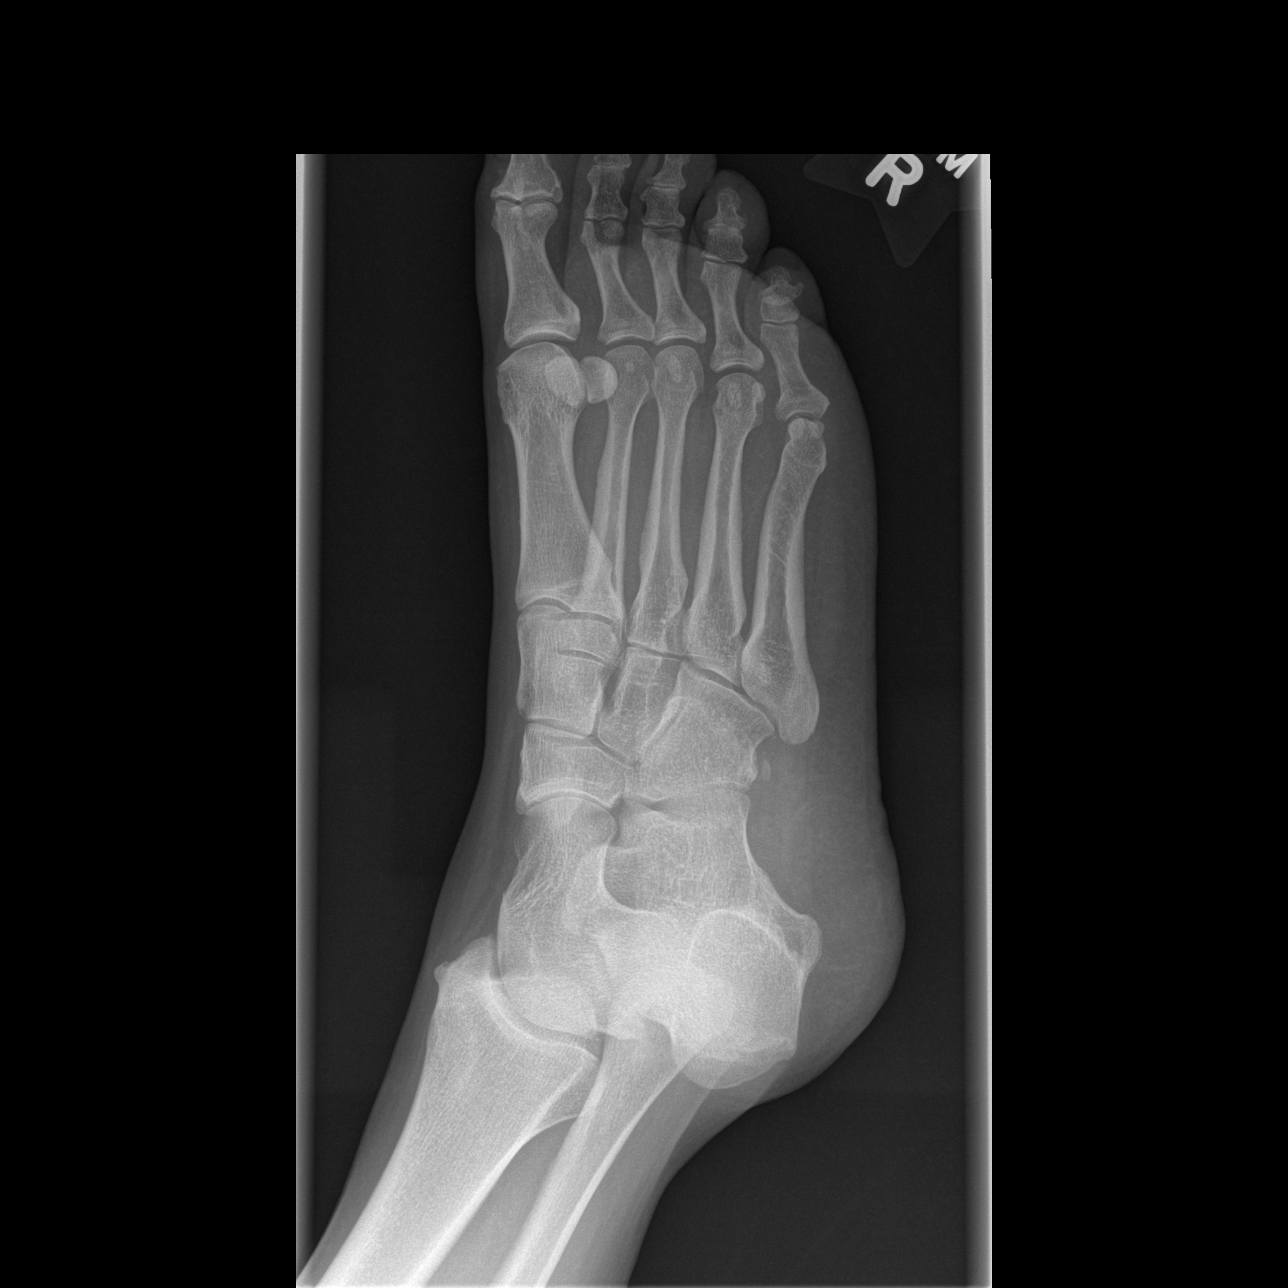

[t foot lat right]
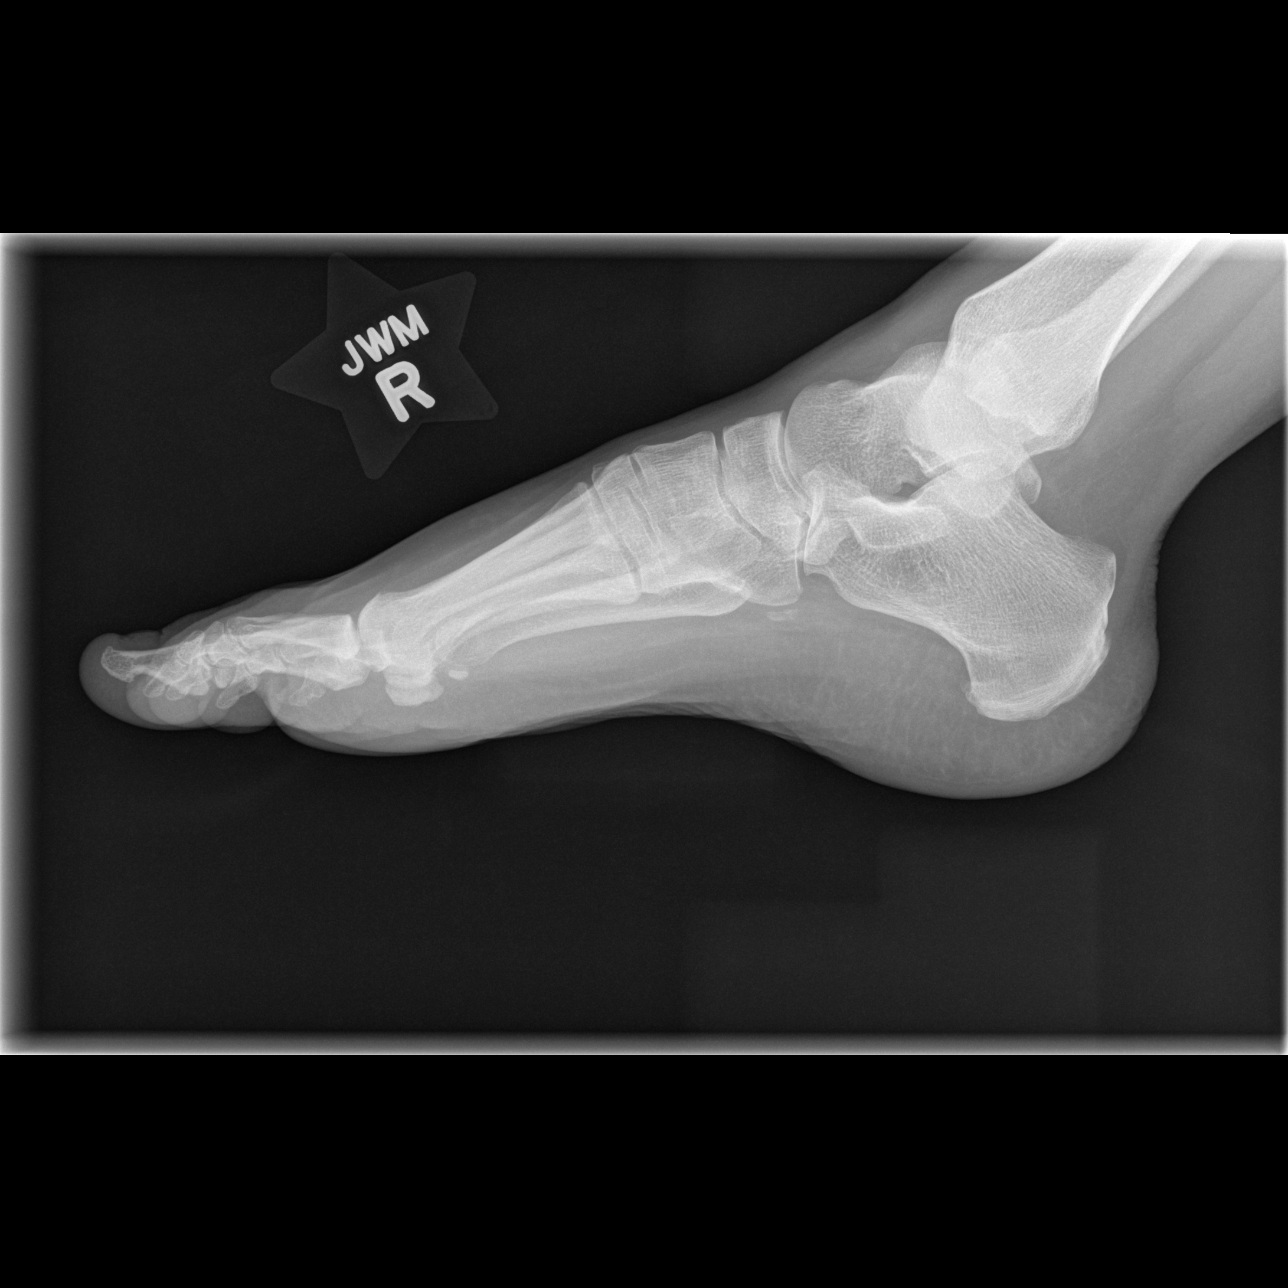

[3 of 3 positions shown; findings below may reference images not displayed]

FINDINGS: There is no evidence of fracture or dislocation.

Mild degenerative spurring is seen involving the interphalangeal
joint of the great toe.  No other significant bone abnormality
identified.
IMPRESSION: No acute findings.

## 2010-08-09 ENCOUNTER — Telehealth: Payer: Self-pay | Admitting: Critical Care Medicine

## 2010-08-09 ENCOUNTER — Emergency Department (HOSPITAL_COMMUNITY)
Admission: EM | Admit: 2010-08-09 | Discharge: 2010-08-09 | Payer: Self-pay | Source: Home / Self Care | Admitting: Emergency Medicine

## 2010-08-11 ENCOUNTER — Encounter: Payer: Self-pay | Admitting: Critical Care Medicine

## 2010-08-11 ENCOUNTER — Ambulatory Visit
Admission: RE | Admit: 2010-08-11 | Discharge: 2010-08-11 | Payer: Self-pay | Source: Home / Self Care | Attending: Critical Care Medicine | Admitting: Critical Care Medicine

## 2010-08-14 HISTORY — PX: VULVECTOMY: SHX1086

## 2010-08-25 IMAGING — CR DG CHEST 2V
2 series · 2 of 2 positions shown · non-contrast
Comparison: 04/26/2009

CLINICAL DATA: Shortness of breath.  Ex-smoker.  Hypertension.

CHEST - 2 VIEW

[w chest pa]
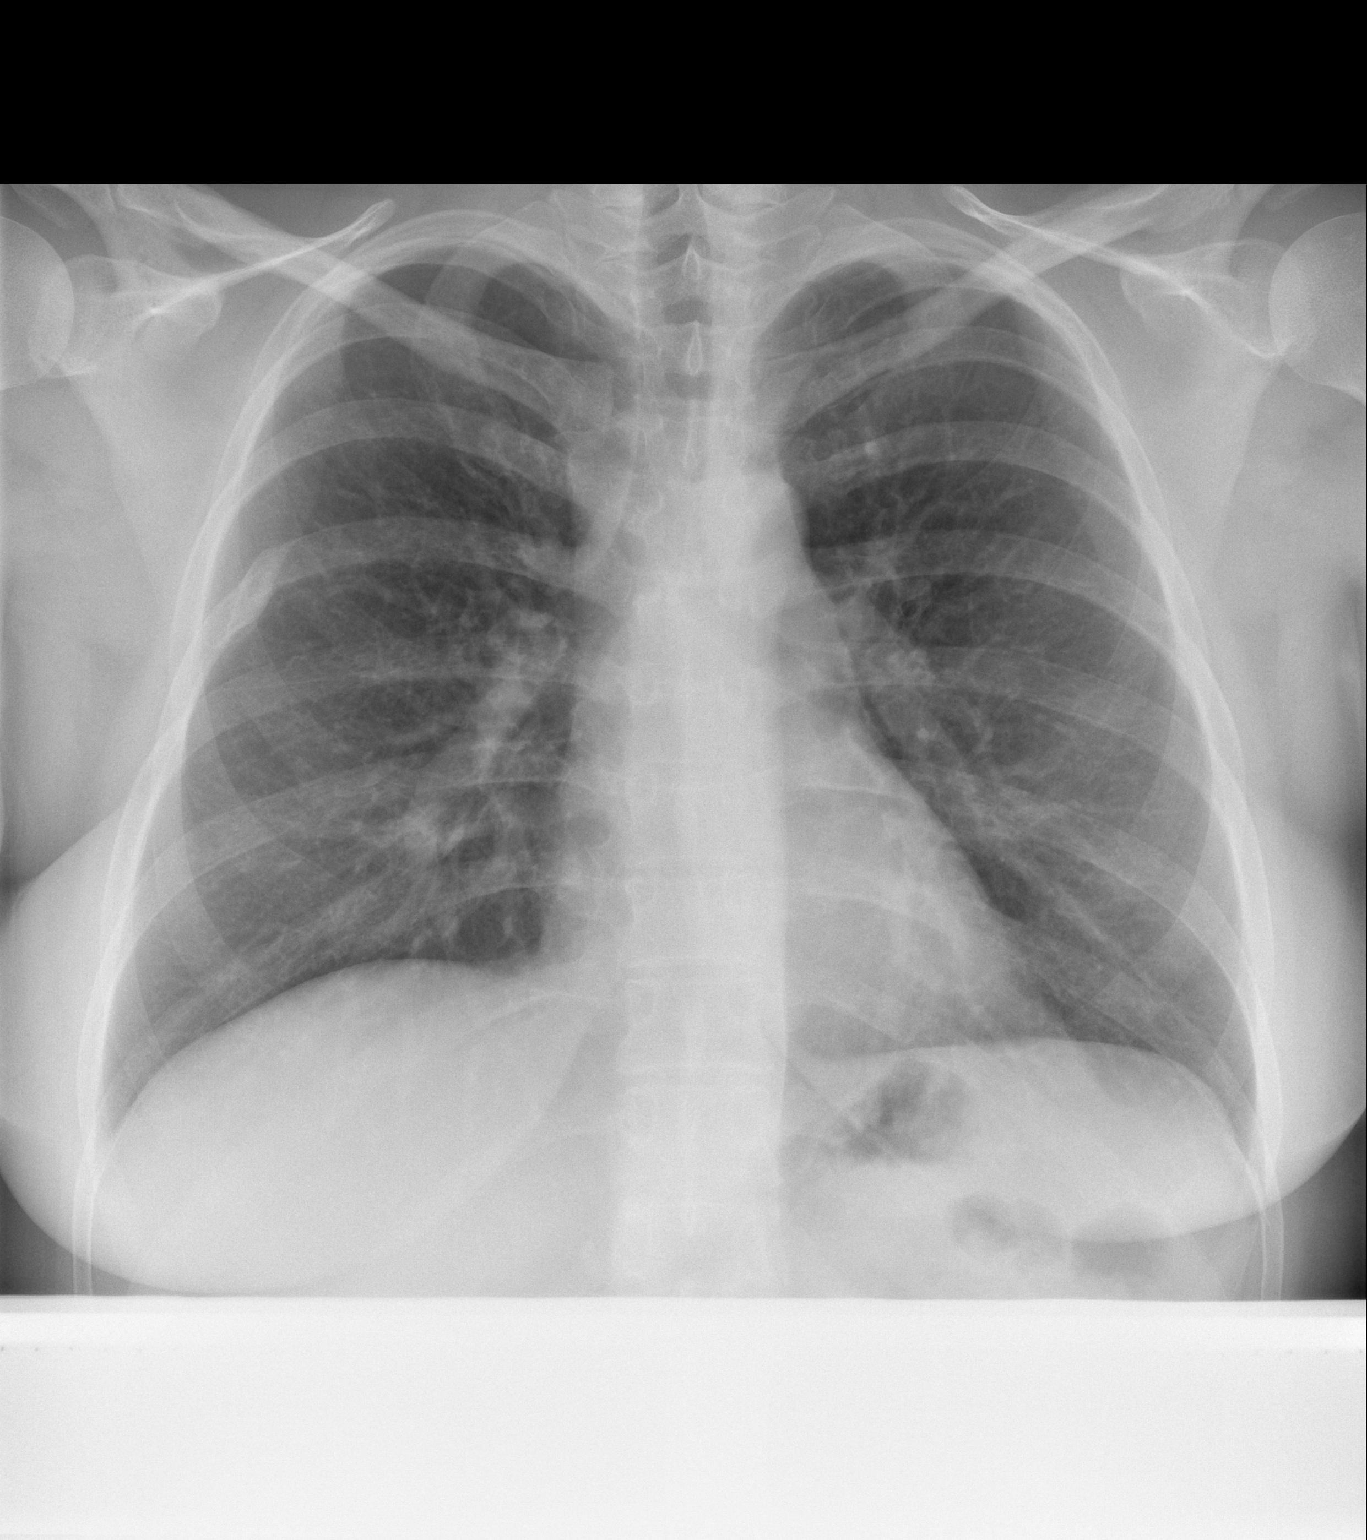

[w chest lat]
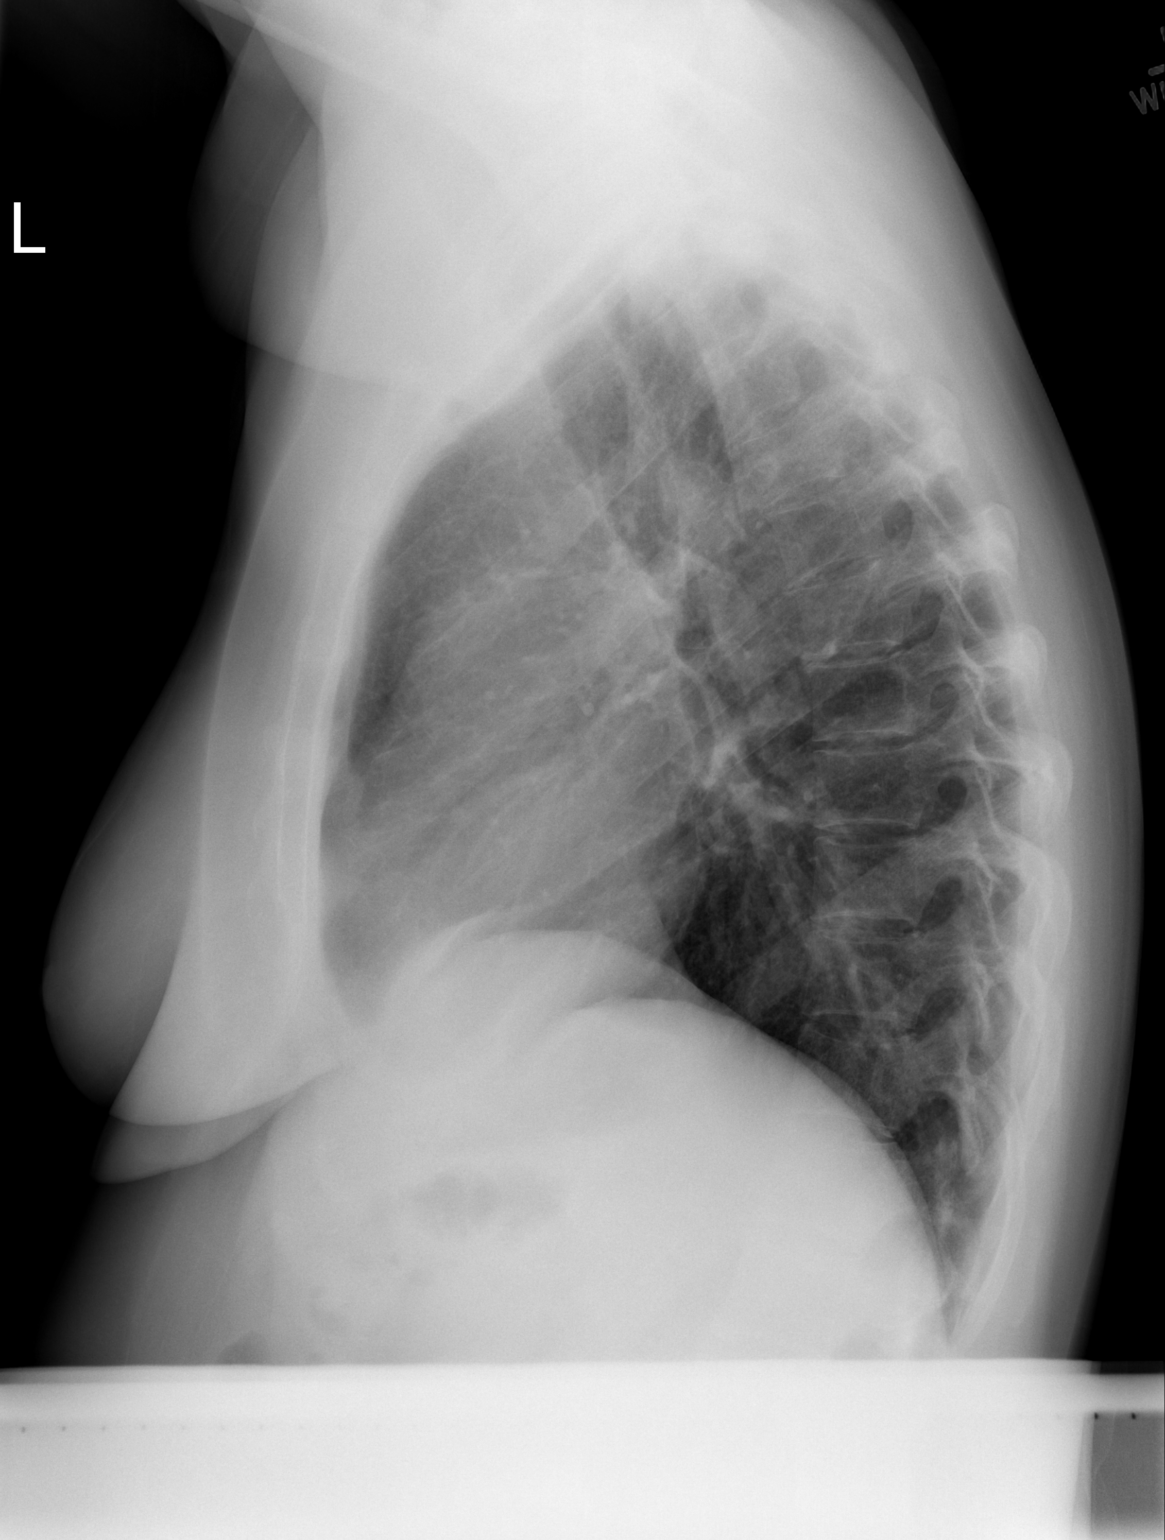

[2 of 2 positions shown; findings below may reference images not displayed]

FINDINGS: Remote right rib trauma. Midline trachea.  Normal heart
size and mediastinal contours. Diffuse peribronchial thickening.
No pleural effusion or pneumothorax.  Clear lungs.
IMPRESSION: 1.  No acute cardiopulmonary disease.
2.  Mild peribronchial thickening which may relate to chronic
bronchitis or smoking.

## 2010-08-26 ENCOUNTER — Telehealth: Payer: Self-pay | Admitting: Family Medicine

## 2010-08-29 ENCOUNTER — Encounter (INDEPENDENT_AMBULATORY_CARE_PROVIDER_SITE_OTHER): Payer: Self-pay | Admitting: *Deleted

## 2010-09-02 ENCOUNTER — Telehealth (INDEPENDENT_AMBULATORY_CARE_PROVIDER_SITE_OTHER): Payer: Self-pay | Admitting: *Deleted

## 2010-09-03 IMAGING — CR DG CHEST 2V
2 series · 2 of 2 positions shown · non-contrast
Comparison: 06/01/2009

CLINICAL DATA: Left-sided jaw and chest pain

CHEST - 2 VIEW

[w chest pa]
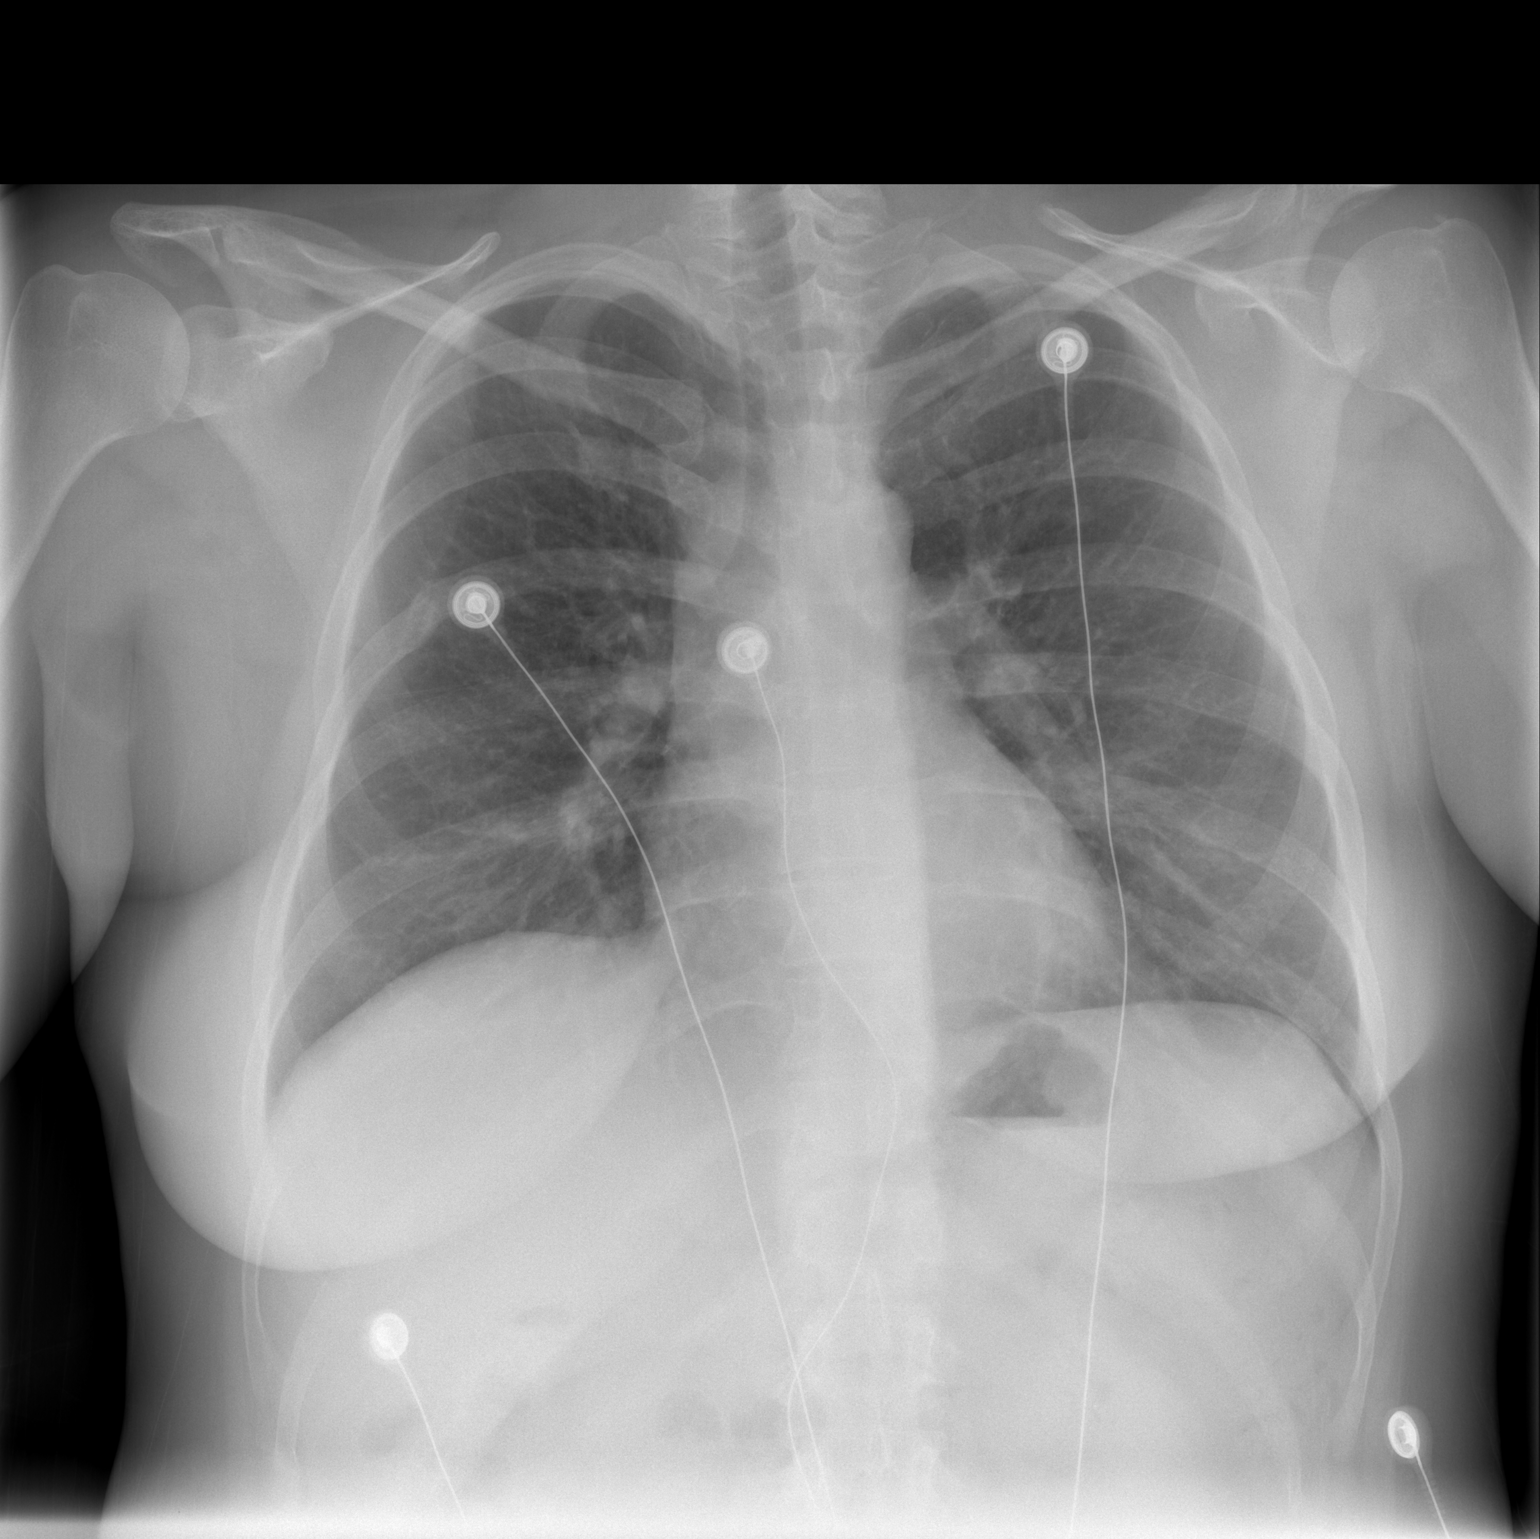

[w chest lat]
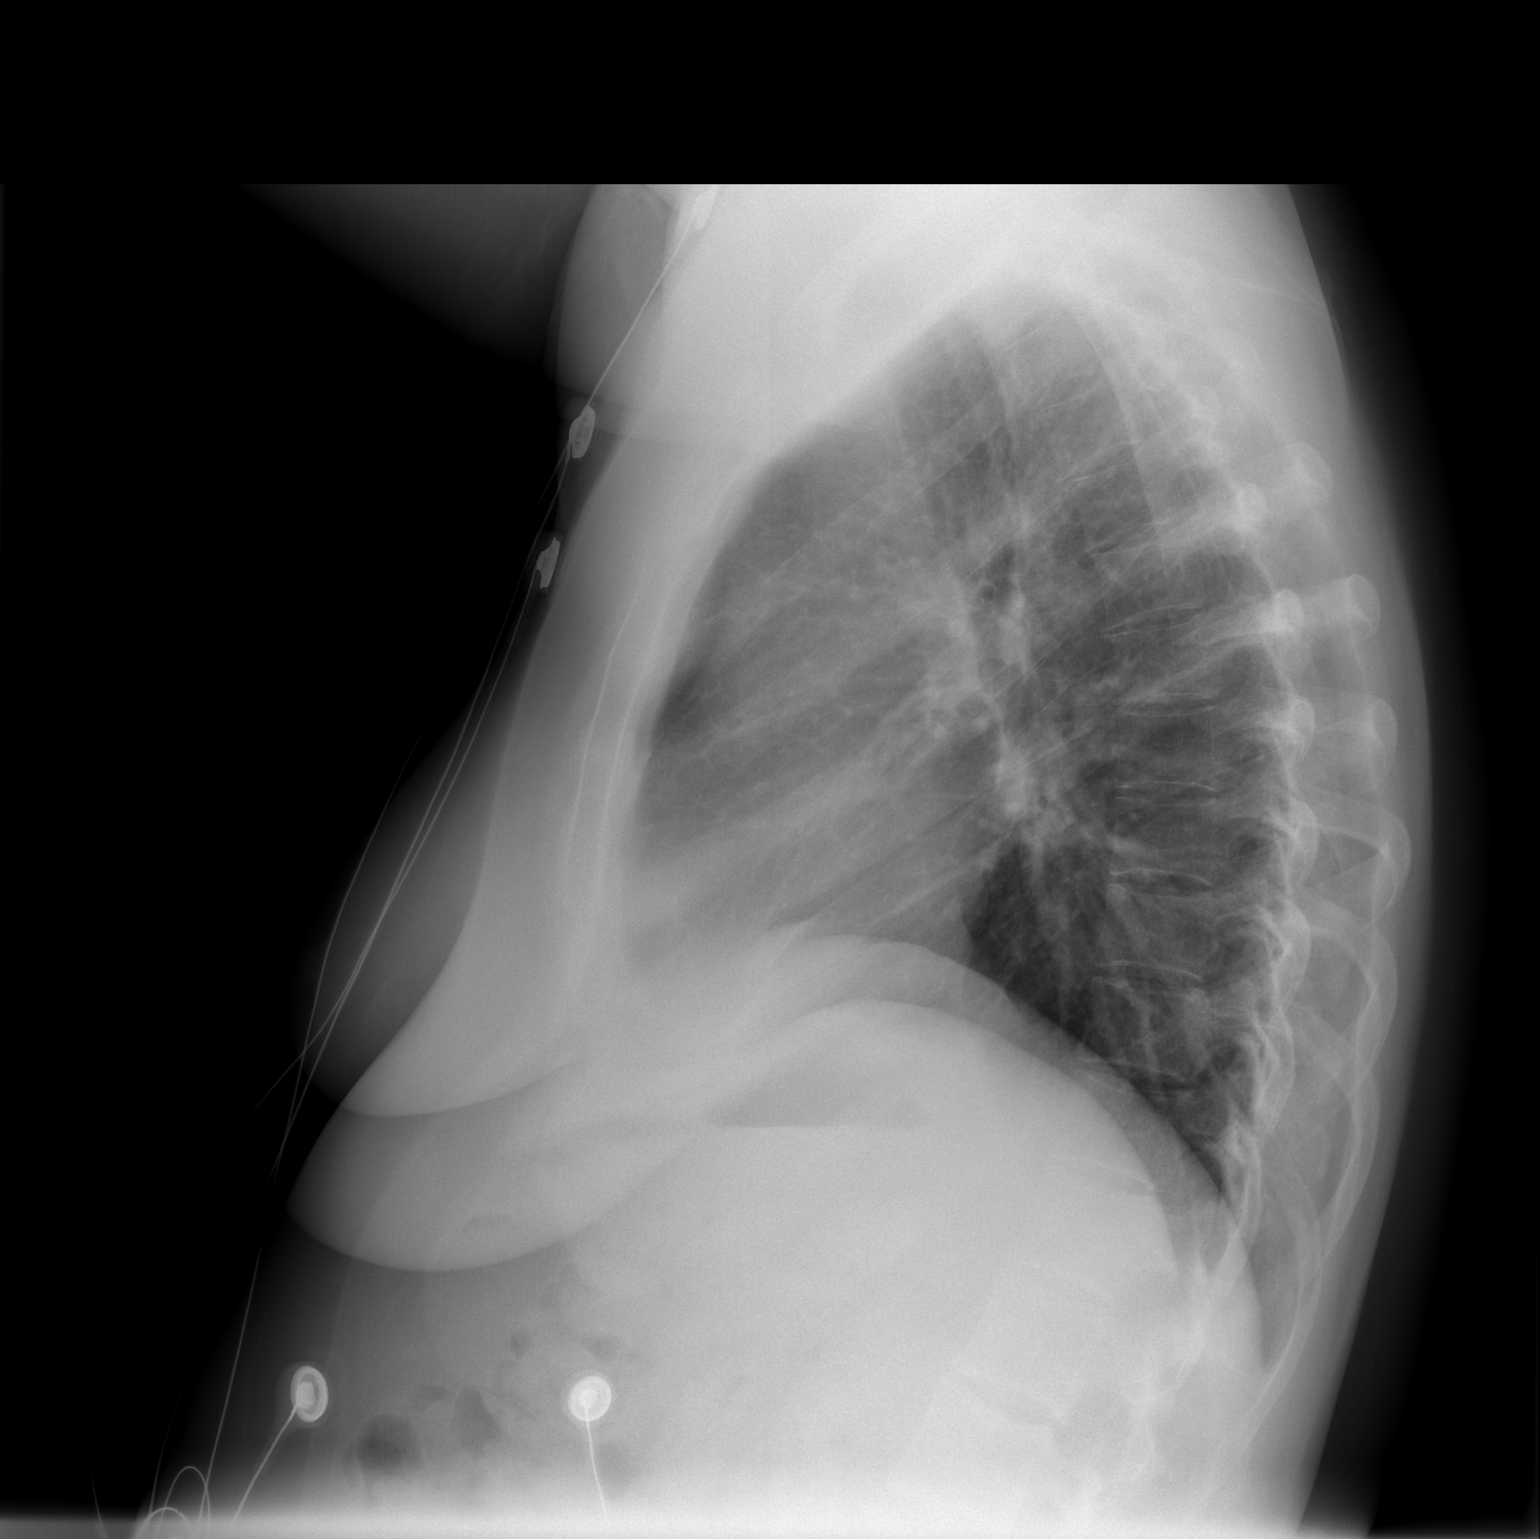

[2 of 2 positions shown; findings below may reference images not displayed]

FINDINGS: Diminished lung volumes.  Mild perihilar opacities
persists suggesting viral pneumonitis.  Cardiac size remains within
normal limits.  No effusion or pneumothorax.
IMPRESSION: Slightly diminished lung volumes compared with priors.  Persistent
perihilar densities suggest a viral process.  See comments above.

## 2010-09-04 ENCOUNTER — Encounter: Payer: Self-pay | Admitting: Family Medicine

## 2010-09-04 ENCOUNTER — Encounter: Payer: Self-pay | Admitting: Internal Medicine

## 2010-09-05 ENCOUNTER — Encounter: Payer: Self-pay | Admitting: Obstetrics & Gynecology

## 2010-09-05 ENCOUNTER — Emergency Department (HOSPITAL_COMMUNITY)
Admission: EM | Admit: 2010-09-05 | Discharge: 2010-09-06 | Payer: Self-pay | Source: Home / Self Care | Admitting: Emergency Medicine

## 2010-09-05 ENCOUNTER — Encounter: Payer: Self-pay | Admitting: Family Medicine

## 2010-09-05 DIAGNOSIS — H02829 Cysts of unspecified eye, unspecified eyelid: Secondary | ICD-10-CM | POA: Insufficient documentation

## 2010-09-06 ENCOUNTER — Emergency Department (HOSPITAL_BASED_OUTPATIENT_CLINIC_OR_DEPARTMENT_OTHER)
Admission: EM | Admit: 2010-09-06 | Discharge: 2010-09-06 | Payer: Self-pay | Source: Home / Self Care | Admitting: Emergency Medicine

## 2010-09-06 LAB — POCT PREGNANCY, URINE: Preg Test, Ur: NEGATIVE

## 2010-09-07 ENCOUNTER — Telehealth: Payer: Self-pay | Admitting: Critical Care Medicine

## 2010-09-07 LAB — POCT I-STAT, CHEM 8
BUN: 11 mg/dL (ref 6–23)
Calcium, Ion: 1.11 mmol/L — ABNORMAL LOW (ref 1.12–1.32)
Chloride: 107 mEq/L (ref 96–112)
Creatinine, Ser: 0.7 mg/dL (ref 0.4–1.2)
Glucose, Bld: 108 mg/dL — ABNORMAL HIGH (ref 70–99)
HCT: 39 % (ref 36.0–46.0)
Hemoglobin: 13.3 g/dL (ref 12.0–15.0)
Potassium: 3.4 mEq/L — ABNORMAL LOW (ref 3.5–5.1)
Sodium: 143 mEq/L (ref 135–145)
TCO2: 25 mmol/L (ref 0–100)

## 2010-09-07 LAB — URINALYSIS, ROUTINE W REFLEX MICROSCOPIC
Bilirubin Urine: NEGATIVE
Hgb urine dipstick: NEGATIVE
Ketones, ur: NEGATIVE mg/dL
Nitrite: NEGATIVE
Protein, ur: NEGATIVE mg/dL
Specific Gravity, Urine: 1.018 (ref 1.005–1.030)
Urine Glucose, Fasting: NEGATIVE mg/dL
Urobilinogen, UA: 0.2 mg/dL (ref 0.0–1.0)
pH: 7.5 (ref 5.0–8.0)

## 2010-09-07 LAB — DIFFERENTIAL
Basophils Absolute: 0.1 10*3/uL (ref 0.0–0.1)
Basophils Relative: 1 % (ref 0–1)
Eosinophils Absolute: 0.2 10*3/uL (ref 0.0–0.7)
Eosinophils Relative: 3 % (ref 0–5)
Lymphocytes Relative: 26 % (ref 12–46)
Lymphs Abs: 1.8 10*3/uL (ref 0.7–4.0)
Monocytes Absolute: 0.6 10*3/uL (ref 0.1–1.0)
Monocytes Relative: 9 % (ref 3–12)
Neutro Abs: 4 10*3/uL (ref 1.7–7.7)
Neutrophils Relative %: 61 % (ref 43–77)

## 2010-09-07 LAB — HEPATIC FUNCTION PANEL
ALT: 33 U/L (ref 0–35)
AST: 24 U/L (ref 0–37)
Albumin: 3.5 g/dL (ref 3.5–5.2)
Alkaline Phosphatase: 62 U/L (ref 39–117)
Bilirubin, Direct: 0.1 mg/dL (ref 0.0–0.3)
Indirect Bilirubin: 0.3 mg/dL (ref 0.3–0.9)
Total Bilirubin: 0.4 mg/dL (ref 0.3–1.2)
Total Protein: 6.4 g/dL (ref 6.0–8.3)

## 2010-09-07 LAB — LIPASE, BLOOD: Lipase: 36 U/L (ref 11–59)

## 2010-09-07 LAB — CBC
HCT: 38.2 % (ref 36.0–46.0)
Hemoglobin: 12.7 g/dL (ref 12.0–15.0)
MCH: 29.4 pg (ref 26.0–34.0)
MCHC: 33.2 g/dL (ref 30.0–36.0)
MCV: 88.4 fL (ref 78.0–100.0)
Platelets: 197 10*3/uL (ref 150–400)
RBC: 4.32 MIL/uL (ref 3.87–5.11)
RDW: 13.3 % (ref 11.5–15.5)
WBC: 6.6 10*3/uL (ref 4.0–10.5)

## 2010-09-08 ENCOUNTER — Encounter: Payer: Self-pay | Admitting: Family Medicine

## 2010-09-10 ENCOUNTER — Encounter: Payer: Self-pay | Admitting: Family Medicine

## 2010-09-11 LAB — CONVERTED CEMR LAB
ALT: 27 units/L (ref 0–35)
AST: 18 units/L (ref 0–37)
Albumin: 4.3 g/dL (ref 3.5–5.2)
Alkaline Phosphatase: 53 units/L (ref 39–117)
BUN: 14 mg/dL (ref 6–23)
Basophils Absolute: 0 10*3/uL (ref 0.0–0.1)
Basophils Relative: 1 % (ref 0–1)
CO2: 22 meq/L (ref 19–32)
Calcium: 9 mg/dL (ref 8.4–10.5)
Chloride: 108 meq/L (ref 96–112)
Creatinine, Ser: 0.52 mg/dL (ref 0.40–1.20)
Eosinophils Absolute: 0.1 10*3/uL (ref 0.0–0.7)
Eosinophils Relative: 2 % (ref 0–5)
Glucose, Bld: 102 mg/dL — ABNORMAL HIGH (ref 70–99)
HCT: 39.3 % (ref 36.0–46.0)
Hemoglobin: 13 g/dL (ref 12.0–15.0)
Iron: 51 ug/dL (ref 42–145)
Lymphocytes Relative: 23 % (ref 12–46)
Lymphs Abs: 1.1 10*3/uL (ref 0.7–4.0)
MCHC: 33.1 g/dL (ref 30.0–36.0)
MCV: 87.9 fL (ref 78.0–100.0)
Monocytes Absolute: 0.3 10*3/uL (ref 0.1–1.0)
Monocytes Relative: 7 % (ref 3–12)
Neutro Abs: 3.2 10*3/uL (ref 1.7–7.7)
Neutrophils Relative %: 67 % (ref 43–77)
Platelets: 216 10*3/uL (ref 150–400)
Potassium: 3.9 meq/L (ref 3.5–5.3)
RBC: 4.47 M/uL (ref 3.87–5.11)
RDW: 14 % (ref 11.5–15.5)
Saturation Ratios: 16 % — ABNORMAL LOW (ref 20–55)
Sodium: 141 meq/L (ref 135–145)
TIBC: 320 ug/dL (ref 250–470)
TSH: 2.36 microintl units/mL (ref 0.350–4.500)
Total Bilirubin: 0.4 mg/dL (ref 0.3–1.2)
Total CK: 28 units/L (ref 7–177)
Total Protein: 6.9 g/dL (ref 6.0–8.3)
Troponin I: <0.01 ng/mL (ref <0.06)
UIBC: 269 ug/dL
WBC: 4.8 10*3/uL (ref 4.0–10.5)

## 2010-09-13 ENCOUNTER — Encounter: Payer: Self-pay | Admitting: Family Medicine

## 2010-09-13 NOTE — Progress Notes (Signed)
Summary: Norvasc  Phone Note Call from Patient   Caller: Patient Call For: Nani Gasser MD Summary of Call: pt went to the ED  because her BP was high. states the docor there put her on Norvasc. Pt wants you to write a rx for norvasc. Initial call taken by: Avon Gully CMA, Duncan Dull),  March 16, 2010 2:18 PM  Follow-up for Phone Call        Stay on the rx he gave you and f/u in 2-3 weeks so we can recheck BP on it and then I can write for new rx.  Follow-up by: Nani Gasser MD,  March 16, 2010 5:10 PM  Additional Follow-up for Phone Call Additional follow up Details #1::        left message with above instructions Additional Follow-up by: Avon Gully CMA, Duncan Dull),  March 17, 2010 8:00 AM
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
## 2010-09-13 NOTE — Procedures (Signed)
Summary: [facility]   Imported By: Lanelle Bal 07/11/2010 08:10:58  _____________________________________________________________________  External Attachment:    Type:   Image     Comment:   External Document

## 2010-09-13 NOTE — Assessment & Plan Note (Signed)
Summary: Sinusitis, etc   Vital Signs:  Patient profile:   44 year old female Height:      62 inches Weight:      173 pounds Pulse rate:   97 / minute BP sitting:   133 / 86  (left arm) Cuff size:   regular CC: discuss multiple problems   Primary Care Provider:  Nani Gasser MD  CC:  discuss multiple problems.  History of Present Illness: Getting iron treatments (iron succrose) once a week.  Seeing Dr. Lelon Perla. Still has days of feeling weak, like her muscles won't work.  Had another esophagel dilation.  Still having alot of swallowing problems.  Still having sinus problems. Would like to be referred to ENT at  [facility] near [facility].  Has had ST with lots of drainag for the last couple of weeks. She Noticed thraot is swollena nd red. Also having bloody nasal drainage.   REd area on her left abdomen where had a lovenox shot in May.  Hasn't changed since then and not tender but still red.  Starting PT again for her leg and back pain.    Wants to get nail fungal clipping today for abnormal toenails.    Has noticed a vaginal odor and would like to do a wet prep.  No change ion discharge.  Thinks she may have a yeast ifection  Had episode where got out of the pool and felt like her body was in concrete and was difficult to move.   Current Medications (verified): 1)  Bentyl 10 Mg/45ml Syrp (Dicyclomine Hcl) .... 5ml Before Meals  As Needed 2)  Tylenol Childrens 160 Mg/84ml Susp (Acetaminophen) .... Per Bottle 3)  Xopenex Hfa 45 Mcg/act Aero (Levalbuterol Tartrate) .... Every 6 Hours Pls Fill ,  This Pt Cannot Tolerate Albuterol Due To Tachycardia 4)  Maalox 600 Mg Chew (Calcium Carbonate Antacid) .... Per Bottle 5)  Ativan 1 Mg Tabs (Lorazepam) .... As Needed 6)  Lidoderm 5 % Ptch (Lidocaine) .... As Needed 7)  Nasonex 50 Mcg/act Susp (Mometasone Furoate) .... Two Sprays Each Nostril Once Daily 8)  Straight Cane .... Dx: Vertigo, Sacroilitis 9)  Clotrimazole-Betamethasone  1-0.05 % Crea (Clotrimazole-Betamethasone) .... Apply Two Times A Day To Rectal Area As Needed Irritation 10)  Voltaren 1 % Gel (Diclofenac Sodium) .... As Needed Arthritis Pain  Allergies (verified): 1)  ! * Iron Iv/po 2)  ! * Singulair 3)  ! Cipro 4)  ! Pcn 5)  ! Erythromycin 6)  ! Sulfa 7)  ! Rocephin 8)  ! Flagyl 9)  ! * Bystolic 10)  ! Beta Blockers 11)  ! * Antiemetics 12)  ! Reglan 13)  ! * Antiinflammatories 14)  ! Trifluoperazine Hcl (Trifluoperazine Hcl) 15)  ! Butorphanol Tartrate (Butorphanol Tartrate) 16)  ! Lisinopril 17)  ! Clonidine Hcl (Clonidine Hcl) 18)  ! * Oral Steroids 19)  ! Macrobid (Nitrofurantoin Monohyd Macro) 20)  Zoloft 21)  Effexor 22)  Prozac 23)  Paxil 24)  Adhesive Tape  Comments:  Nurse/Medical Assistant: The patient's medications and allergies were reviewed with the patient and were updated in the Medication and Allergy Lists. Avon Gully CMA, Duncan Dull) (March 10, 2010 10:22 AM)  Physical Exam  General:  Well-developed,well-nourished,in no acute distress; alert,appropriate and cooperative throughout examination Head:  Normocephalic and atraumatic without obvious abnormalities. No apparent alopecia or balding. Eyes:  No corneal or conjunctival inflammation noted. EOMI. Perrla.  Ears:  External ear exam shows no significant lesions or deformities.  Otoscopic examination reveals clear canals, tympanic membranes are intact bilaterally without bulging, retraction, inflammation or discharge. Hearing is grossly normal bilaterally. Nose:  External nasal examination shows no deformity or inflammation. Nasal mucosa are erythematous  and moist without lesions or exudates. Mouth:  Oral mucosa and oropharynx without lesions or exudates.  Teeth in good repair. Neck:  No deformities, masses, or tenderness noted. Lungs:  Normal respiratory effort, chest expands symmetrically. Lungs are clear to auscultation, no crackles or wheezes. Heart:  Normal rate  and regular rhythm. S1 and S2 normal without gallop, murmur, click, rub or other extra sounds. Skin:  no rashes.  Does ave a round area of erythema the teh left of the umbilicus about 4 cm round.  Cervical Nodes:  Mildy swollen and tender bilat ant cerv nodes.  Psych:  Cognition and judgment appear intact. Alert and cooperative with normal attention span and concentration. No apparent delusions, illusions, hallucinations   Impression & Recommendations:  Problem # 1:  OTHER CHRONIC SINUSITIS (ICD-473.8) Will treat for sinusitis. She has many intolerances to ABX and often gets GI upset on doxy so will use low dose doxy. Also rec restart her nasonex. She did use it last night.   Her updated medication list for this problem includes:    Nasonex 50 Mcg/act Susp (Mometasone furoate) .Marland Kitchen..Marland Kitchen Two sprays each nostril once daily    Doxycycline Hyclate 50 Mg Caps (Doxycycline hyclate) .Marland Kitchen... Take 1 tablet by mouth two times a day for 7 days  Orders: ENT Referral (ENT)  Problem # 2:  VAGINITIS (ICD-616.10) Wet prep collected.  Will call with results.  The following medications were removed from the medication list:    Metrogel-vaginal 0.75 % Gel (Metronidazole) .Marland Kitchen... 1 gram applicaotr full pv at bedtime for 5 nights Her updated medication list for this problem includes:    Doxycycline Hyclate 50 Mg Caps (Doxycycline hyclate) .Marland Kitchen... Take 1 tablet by mouth two times a day for 7 days  Orders: T-Wet Prep for Christoper Allegra, [facility] [phone])  Problem # 3:  ONYCHOLYSIS (ICD-703.8) Nail clippings collected today.  Orders: T-Culture, Fungus w/o Smear ([id number])  Complete Medication List: 1)  Bentyl 10 Mg/35ml Syrp (Dicyclomine hcl) .... 5ml before meals  as needed 2)  Tylenol Childrens 160 Mg/22ml Susp (Acetaminophen) .... Per bottle 3)  Xopenex Hfa 45 Mcg/act Aero (Levalbuterol tartrate) .... Every 6 hours pls fill ,  this pt cannot tolerate albuterol due to tachycardia 4)  Maalox 600 Mg Chew  (Calcium carbonate antacid) .... Per bottle 5)  Ativan 1 Mg Tabs (Lorazepam) .... As needed 6)  Lidoderm 5 % Ptch (Lidocaine) .... As needed 7)  Nasonex 50 Mcg/act Susp (Mometasone furoate) .... Two sprays each nostril once daily 8)  Straight Cane  .... Dx: vertigo, sacroilitis 9)  Clotrimazole-betamethasone 1-0.05 % Crea (Clotrimazole-betamethasone) .... Apply two times a day to rectal area as needed irritation 10)  Voltaren 1 % Gel (Diclofenac sodium) .... As needed arthritis pain 11)  Doxycycline Hyclate 50 Mg Caps (Doxycycline hyclate) .... Take 1 tablet by mouth two times a day for 7 days  Patient Instructions: 1)  We will call you tomorrow with your vaginal swab results.  2)  The toenail culture takes 3-4 weeks to get the results.  3)  We will make your referral to ENT at [facility].  Prescriptions: DOXYCYCLINE HYCLATE 50 MG CAPS (DOXYCYCLINE HYCLATE) Take 1 tablet by mouth two times a day for 7 days  #14 x 0   Entered and Authorized  by:   Nani Gasser MD   Signed by:   Nani Gasser MD on 03/10/2010   Method used:   Electronically to        [facility] # [phone]* (retail)       [address] [place], [place]  [address]       Ph: [phone]       Fax: [phone]   RxID:   [id number]

## 2010-09-13 NOTE — Progress Notes (Signed)
  Phone Note Other Incoming   Request: Send information Summary of Call: Records received from Dr. Donell Sievert. 31 pages forwarded to Dr. Delford Field for review.

## 2010-09-13 NOTE — Progress Notes (Signed)
Summary: pt had episode this weekend-ask [facility]  Phone Note Call from Patient Call back at Work Phone [phone]   Caller: Patient Reason for Call: Talk to Nurse, Talk to Doctor Summary of Call: pt been having tachacardia episodes and she wants to talk about going on meds to help because it happen over the weekend and she feels bad Initial call taken by: Omer Jack,  September 13, 2009 11:25 AM  Follow-up for Phone Call        called pt HR was in the 90's and is coplaining that she feels weak ? wants to know what she can take for the episodes.  Will call her back and let her know what to take after discussing with Dr Russ Halo, RN, BSN  September 13, 2009 12:01 PM Pt has an appoinment on Friday will discuss treatment options at that time.  LMOM  Dennis Bast, RN, BSN  September 13, 2009 5:30 PM

## 2010-09-13 NOTE — Assessment & Plan Note (Signed)
Summary: cyst vaginal   Vital Signs:  Patient profile:   45 year old female Height:      62 inches Weight:      174 pounds Pulse rate:   98 / minute BP sitting:   138 / 91  (right arm)  Vitals Entered By: Avon Gully CMA, Duncan Dull) (June 07, 2010 10:21 AM) CC: cyst in the vaginal area first noticed a week ago   Primary Care Provider:  Nani Gasser MD  CC:  cyst in the vaginal area first noticed a week ago.  History of Present Illness: cyst in the vaginal area first noticed a week ago. Says the area feel dry and tender. Has noticed some occ itching but no discharge. Has a yeast infection a month or so ago.  Does have hx of rectocele. She was pushing on the perineal area to help have a BM when she noticed it. No allevaiting or aggreavating sxs.    Current Medications (verified): 1)  Tylenol 500 Mg/55ml Liqd (Acetaminophen) .... As Needed 2)  Xopenex Hfa 45 Mcg/act Aero (Levalbuterol Tartrate) .... Every 6 Hours Prn  Pls Fill ,  This Pt Cannot Tolerate Albuterol Due To Tachycardia 3)  Maalox 600 Mg Chew (Calcium Carbonate Antacid) .... Per Bottle 4)  Lidoderm 5 % Ptch (Lidocaine) .... As Needed 5)  Straight Cane .... Dx: Vertigo, Sacroilitis 6)  Zofran 4 Mg Tabs (Ondansetron Hcl) .... Take 1 Tablet By Mouth Two Times A Day As Needed Nausea 7)  Patanase 0.6 % Soln (Olopatadine Hcl) .... As Needed 8)  Clotrimazole 1 % Crea (Clotrimazole) .... Pply Two Times A Day For 3 Weeks To External Vaginal Area.  Allergies (verified): 1)  ! * Iron Iv/po 2)  ! * Singulair 3)  ! Cipro 4)  ! Pcn 5)  ! Erythromycin 6)  ! Sulfa 7)  ! Rocephin 8)  ! Flagyl 9)  ! * Bystolic 10)  ! Beta Blockers 11)  ! * Antiemetics 12)  ! Reglan 13)  ! * Antiinflammatories 14)  ! Trifluoperazine Hcl (Trifluoperazine Hcl) 15)  ! Butorphanol Tartrate (Butorphanol Tartrate) 16)  ! Lisinopril 17)  ! Clonidine Hcl (Clonidine Hcl) 18)  ! * Oral Steroids 19)  ! Macrobid (Nitrofurantoin Monohyd  Macro) 20)  Zoloft 21)  Effexor 22)  Prozac 23)  Paxil 24)  Adhesive Tape  Comments:  Nurse/Medical Assistant: The patient's medications and allergies were reviewed with the patient and were updated in the Medication and Allergy Lists. Avon Gully CMA, Duncan Dull) (June 07, 2010 10:23 AM)  Physical Exam  General:  Well-developed,well-nourished,in no acute distress; alert,appropriate and cooperative throughout examination Genitalia:  normal introitus.  AT the 6 oclock postion right at the  introitus has a small 0.5cm mucocele. No sign of infection. It is tender.  No drainage or erythema. No abnormal vaginal d/c. No tenderness over the perineum. No redness over the labia.    Impression & Recommendations:  Problem # 1:  VAGINITIS (ICD-616.10) Gave reassurance that the lesion looks like a mucocele. Will refer ro GYN since she feels it is irritating and painful for further evauation and treament. May benefit from being lanced. I don't see any evidence of infection thought consider this. Call immediatly if any fever.  Will also send wet prep since noticed some ithcing and had a recent yeast vaginitis.  Orders: Gynecologic Referral (Gyn) T-Wet Prep for Christoper Allegra, [facility] [phone])  Complete Medication List: 1)  Tylenol 500 Mg/73ml Liqd (Acetaminophen) .... As needed 2)  Xopenex Hfa 45 Mcg/act Aero (Levalbuterol tartrate) .... Every 6 hours prn  pls fill ,  this pt cannot tolerate albuterol due to tachycardia 3)  Maalox 600 Mg Chew (Calcium carbonate antacid) .... Per bottle 4)  Lidoderm 5 % Ptch (Lidocaine) .... As needed 5)  Straight Cane  .... Dx: vertigo, sacroilitis 6)  Zofran 4 Mg Tabs (Ondansetron hcl) .... Take 1 tablet by mouth two times a day as needed nausea 7)  Patanase 0.6 % Soln (Olopatadine hcl) .... As needed 8)  Clotrimazole 1 % Crea (Clotrimazole) .... Pply two times a day for 3 weeks to external vaginal area.   Orders Added: 1)  Gynecologic Referral  [Gyn] 2)  T-Wet Prep for Christoper Allegra, [facility] [[id number]] 3)  Est. Patient Level III [09811]

## 2010-09-13 NOTE — Letter (Signed)
Summary: [facility] @ [facility] [place]   Imported By: Lanelle Bal 04/26/2010 11:11:56  _____________________________________________________________________  External Attachment:    Type:   Image     Comment:   External Document

## 2010-09-13 NOTE — Letter (Signed)
Summary: Primary Care Consult Scheduled Letter  [facility] at [facility]  [address]   [place], [place] [address]   Phone: [phone]  Fax: [phone]      09/09/2009 MRN: [id number]  Jennifer [name] [address] [place], [place]  [address]    Dear Ms. Hern,      We have scheduled an appointment for you.  At the recommendation of Dr.METHENEY, we have scheduled you a consult with DR Dierdre Forth [facility]HEUMATOLOGY, [facility]   on FEBRUARY 1,2011 at 11AM.  Their address is [address] ,[place] The office phone number is [phone].  If this appointment day and time is not convenient for you, please feel free to call the office of the doctor you are being referred to at the number listed above and reschedule the appointment.     It is important for you to keep your scheduled appointments. We are here to make sure you are given good patient care. If you have questions or you have made changes to your appointment, please notify us at  [phone], ask for HELEN.    Thank you,  Patient Care Coordinator [place] at [facility]

## 2010-09-13 NOTE — Letter (Signed)
Summary: [facility]  [facility]   Imported By: Sherian Rein 04/28/2010 10:45:51  _____________________________________________________________________  External Attachment:    Type:   Image     Comment:   External Document
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
## 2010-09-13 NOTE — Progress Notes (Signed)
Summary: Wants Zofran  Phone Note Call from Patient Call back at Home Phone [phone]   Caller: Patient Call For: Nani Gasser MD Summary of Call: Pt would like to know if you would call in Zofran for her- thinks she has the flu- H/A, bodyaches, chills fever, nausea Initial call taken by: Kathlene November,  October 08, 2009 11:45 AM    New/Updated Medications: ONDANSETRON HCL 4 MG TABS (ONDANSETRON HCL) one bymouth every 8 hours as needed for nausea Prescriptions: ONDANSETRON HCL 4 MG TABS (ONDANSETRON HCL) one bymouth every 8 hours as needed for nausea  #8 x 0   Entered and Authorized by:   Nani Gasser MD   Signed by:   Nani Gasser MD on 10/08/2009   Method used:   Electronically to        [facility] # [phone]* (retail)       [address] [place], [place]  [address]       Ph: [phone]       Fax: [phone]   RxID:   [id number]

## 2010-09-13 NOTE — Letter (Signed)
Summary: [facility] @ [facility] [place]   Imported By: Lanelle Bal 03/29/2010 13:13:41  _____________________________________________________________________  External Attachment:    Type:   Image     Comment:   External Document

## 2010-09-13 NOTE — Progress Notes (Signed)
Summary: Insurance declined to cover pain management  Phone Note Call from Patient Call back at Home Phone [phone]   Caller: Patient Call For: Nani Gasser MD Summary of Call: Pt calls and states that her insurance will not cover pain management and just wanted to let you know now that she is back to "[facility]" Initial call taken by: Kathlene November,  October 14, 2009 12:00 PM  Follow-up for Phone Call        Did they decline accupuncture or the actual pain managment consult or does your insurance have a preference for where you go?  This is still an MD so I have never had an insurance company say they won't pay for a specialist.  Follow-up by: Nani Gasser MD,  October 14, 2009 12:04 PM  Additional Follow-up for Phone Call Additional follow up Details #1::        Pt notified of above Additional Follow-up by: Kathlene November,  October 15, 2009 10:22 AM

## 2010-09-13 NOTE — Letter (Signed)
Summary: [facility]  [facility]   Imported By: Lanelle Bal 07/11/2010 08:11:45  _____________________________________________________________________  External Attachment:    Type:   Image     Comment:   External Document

## 2010-09-13 NOTE — Assessment & Plan Note (Signed)
Summary: Left arm weakness, chest pain   Vital Signs:  Patient profile:   45 year old female Height:      62 inches Weight:      166 pounds BMI:     30.47 O2 Sat:      100 % on Room air Temp:     98.8 degrees F oral Pulse rate:   108 / minute BP sitting:   148 / 97  (left arm)  Vitals Entered By: Payton Spark CMA (September 30, 2009 1:35 PM)  O2 Flow:  Room air CC: L sided chest pain over L breast    Primary Care Provider:  Nani Gasser MD  CC:  L sided chest pain over L breast .  History of Present Illness: L sided chest pain over L breast. Saw pulmonologist who is ordering an overnight pulse ox. Went to ED for this and had neg cardiac enzymes. Was d/c home.    Having sinus congestion and chest pain for 2 weeks.  Hasn't  been on any recent ABX Having green nasal mucous and occ bloddy nose.  No fever. Was given rx for doxy but hasn't filled it yet. Plans to fill it today.    Still getting chest pain in teh left upper chest and felt the left arm is getting weaker overall.  Thought her exams here have demonstrated normal strength.  Still very worried about her heart because of her family history.  STill getting some burning sensation between her shoulder blades.  Still feel her legs are weak sometimes.   Saw the rheumatology who mentioned her vitamin D was low. Says drinks her dairy.   Allergies: 1)  ! * Iron Iv/po 2)  ! * Singulair 3)  ! Cipro 4)  ! Pcn 5)  ! Erythromycin 6)  ! Sulfa 7)  ! Rocephin 8)  ! Flagyl 9)  ! * Bystolic 10)  ! Beta Blockers 11)  ! * Antiemetics 12)  ! Reglan 13)  ! * Antiinflammatories 14)  ! Trifluoperazine Hcl (Trifluoperazine Hcl) 15)  ! Butorphanol Tartrate (Butorphanol Tartrate) 16)  ! Lisinopril 17)  ! Clonidine Hcl (Clonidine Hcl) 18)  ! * Oral Steroids 19)  ! Macrobid (Nitrofurantoin Monohyd Macro) 20)  Zoloft 21)  Effexor 22)  Prozac 23)  Paxil 24)  Adhesive Tape  Physical Exam  General:   Well-developed,well-nourished,in no acute distress; alert,appropriate and cooperative throughout examination Head:  Normocephalic and atraumatic without obvious abnormalities. No apparent alopecia or balding. Eyes:  No corneal or conjunctival inflammation noted. EOMI. Perrla.  Ears:  External ear exam shows no significant lesions or deformities.  Otoscopic examination reveals clear canals, tympanic membranes are intact bilaterally without bulging, retraction, inflammation or discharge. Hearing is grossly normal bilaterally. Nose:  External nasal examination shows no deformity or inflammation. Nasal mucosa are pink and moist without lesions or exudates. Mouth:  Oral mucosa and oropharynx without lesions or exudates.  Teeth in good repair. Neck:  No deformities, masses, or tenderness noted. Lungs:  Normal respiratory effort, chest expands symmetrically. Lungs are clear to auscultation, no crackles or wheezes. Heart:  Normal rate and regular rhythm. S1 and S2 normal without gallop, murmur, click, rub or other extra sounds. Skin:  no rashes.   Psych:  Cognition and judgment appear intact. Alert and cooperative with normal attention span and concentration. No apparent delusions, illusions, hallucinations   Impression & Recommendations:  Problem # 1:  PARESTHESIA (ICD-782.0) Will schedule for nerve conduction study of teh left arm.  She has pain in her shoulder adn chest on that side for month and now she feels that arm is more weak so explained could be the nerves.   Orders: EMR Misc Charge Code [facility])  Problem # 2:  CHEST PAIN, ATYPICAL (ICD-786.59) GAve her reassurance that this is not cardiac.  Some of her cardiac pain may really be anxiety and we discussed this.    Complete Medication List: 1)  Bentyl 10 Mg/37ml Syrp (Dicyclomine hcl) .... 5ml before meals  as needed 2)  Carafate 1 Gm/41ml Susp (Sucralfate) .Marland Kitchen.. 1 teaspoon  four times a day  as needed 3)  Tylenol Extra Strength 1000  Mg/2ml Liqd (Acetaminophen) .... As needed 4)  Nitroglycerin 0.4 Mg Subl (Nitroglycerin) .... One tablet under tongue every 5 minutes as needed for chest pain---may repeat times three 5)  Aerochamber Mv Misc (Spacer/aero-holding chambers) .... Use with xopenex 6)  Xopenex Hfa 45 Mcg/act Aero (Levalbuterol tartrate) .... Every 6 hours pls fill ,  this pt cannot tolerate albuterol due to tachycardia 7)  Mylanta 200-200-20 Mg/28ml Susp (Alum & mag hydroxide-simeth) .... As needed 8)  Ativan 1 Mg Tabs (Lorazepam) .... As needed 9)  Lidoderm 5 % Ptch (Lidocaine) .... As needed 10)  Restasis 0.05 % Emul (Cyclosporine) .... Uad 11)  Lopressor 50 Mg Tabs (Metoprolol tartrate) .... 1/2 tablet daily as needed 12)  Nasonex 50 Mcg/act Susp (Mometasone furoate) .... Two sprays each nostril once daily 13)  Doxycycline Monohydrate 100 Mg Caps (Doxycycline monohydrate) .Marland Kitchen.. 1 twice daily 14)  Straight Cane  .... Dx: vertigo, sacroilitis Prescriptions: STRAIGHT CANE Dx: vertigo, sacroilitis  #1 x 0   Entered and Authorized by:   Nani Gasser MD   Signed by:   Nani Gasser MD on 09/30/2009   Method used:   Print then Give to Patient   RxID:   [id number]

## 2010-09-13 NOTE — Assessment & Plan Note (Signed)
Summary: Shoulder pain, vaginitis.    Vital Signs:  Patient profile:   45 year old female Weight:      167.25 pounds BMI:     30.70 O2 Sat:      100 % on Room air Temp:     98.7 degrees F oral Pulse rate:   105 / minute Pulse rhythm:   irregular Resp:     20 per minute BP sitting:   125 / 82  (left arm) Cuff size:   large  Vitals Entered By: Glendell Docker CMA (October 12, 2009 11:17 AM)  O2 Flow:  Room air CC: Return Office VIsit Comments return office visit , personal to discuss with doctor. Phone message state patient was seen in Er over the weekend because she sliced her finger and had stitches placed   Primary Care Provider:  Nani Gasser MD  CC:  Return Office VIsit.  History of Present Illness: Went for nerve conduction study on the left arm and was told it was normal. Have  a burning sensation in the back of her left shoulder.  Arm still weak.  I dont' have report yet.  Went to ED for pain on 2/26 and told it was radiculopathy from her neck.  Mild degnerarive changes on her MRI of the cervical spine on 07/15/07.  Has had PT in the past and feels the more she does with it the more it bothers her.  Feels her left axilla is lumpy and painful.   Thinks her yeast infection is returning.  Having sxs for a cuple of days. Would like ot be checked.   Allergies: 1)  ! * Iron Iv/po 2)  ! * Singulair 3)  ! Cipro 4)  ! Pcn 5)  ! Erythromycin 6)  ! Sulfa 7)  ! Rocephin 8)  ! Flagyl 9)  ! * Bystolic 10)  ! Beta Blockers 11)  ! * Antiemetics 12)  ! Reglan 13)  ! * Antiinflammatories 14)  ! Trifluoperazine Hcl (Trifluoperazine Hcl) 15)  ! Butorphanol Tartrate (Butorphanol Tartrate) 16)  ! Lisinopril 17)  ! Clonidine Hcl (Clonidine Hcl) 18)  ! * Oral Steroids 19)  ! Macrobid (Nitrofurantoin Monohyd Macro) 20)  Zoloft 21)  Effexor 22)  Prozac 23)  Paxil 24)  Adhesive Tape  Physical Exam  General:  Well-developed,well-nourished,in no acute distress;  alert,appropriate and cooperative throughout examination Lungs:  Normal respiratory effort, chest expands symmetrically. Lungs are clear to auscultation, no crackles or wheezes. Heart:  Normal rate and regular rhythm. S1 and S2 normal without gallop, murmur, click, rub or other extra sounds. Msk:  Left shoulder with NROM but has pain radiating over teh deltoid when extends arm above her head and at rest has pain over the biceps tendon.  Strength is 5/5 in all directions.  fingr strength is 5/5 Skin:  no rashes.   Axillary Nodes:  No LN in the axilla on the left.   Psych:  Cognition and judgment appear intact. Alert and cooperative with normal attention span and concentration. No apparent delusions, illusions, hallucinations   Impression & Recommendations:  Problem # 1:  SHOULDER PAIN, LEFT (ICD-719.41)  Discussed options. Will try acupuncutre. Pt doesn want pain meds or steroids.  Pt seems open to this since she feels she failed PT in the past.  Thought I really think PT would help her.  I also encouraged her to f/u back up with ortho. Just becaues the steroid shot didn't work doesn't mean they can't help  her. IN fact explained that since the injection didn't help that this may help clear up her diagnosis and potential treatment.  Encouraged her to considre f/iu with orhtho. explained that I am not a [facility] expert and that what I can offer her further at this point is very limited.  Will call her back when I get a copy of the nerve conduction testing.  Her updated medication list for this problem includes:    Tylenol Extra Strength 1000 Mg/31ml Liqd (Acetaminophen) .Marland Kitchen... As needed  Orders: Pain Clinic Referral (Pain)  Problem # 2:  VAGINITIS (ICD-616.10) Wet prep sent. Should hve the results tomorrow.  Orders: T-Wet Prep for Christoper Allegra, [facility] [phone])  Complete Medication List: 1)  Bentyl 10 Mg/70ml Syrp (Dicyclomine hcl) .... 5ml before meals  as needed 2)  Carafate 1 Gm/60ml Susp  (Sucralfate) .Marland Kitchen.. 1 teaspoon  four times a day  as needed 3)  Tylenol Extra Strength 1000 Mg/74ml Liqd (Acetaminophen) .... As needed 4)  Nitroglycerin 0.4 Mg Subl (Nitroglycerin) .... One tablet under tongue every 5 minutes as needed for chest pain---may repeat times three 5)  Aerochamber Mv Misc (Spacer/aero-holding chambers) .... Use with xopenex 6)  Xopenex Hfa 45 Mcg/act Aero (Levalbuterol tartrate) .... Every 6 hours pls fill ,  this pt cannot tolerate albuterol due to tachycardia 7)  Mylanta 200-200-20 Mg/65ml Susp (Alum & mag hydroxide-simeth) .... As needed 8)  Ativan 1 Mg Tabs (Lorazepam) .... As needed 9)  Lidoderm 5 % Ptch (Lidocaine) .... As needed 10)  Restasis 0.05 % Emul (Cyclosporine) .... Uad 11)  Lopressor 50 Mg Tabs (Metoprolol tartrate) .... 1/2 tablet daily as needed 12)  Nasonex 50 Mcg/act Susp (Mometasone furoate) .... Two sprays each nostril once daily 13)  Straight Cane  .... Dx: vertigo, sacroilitis 14)  Ondansetron Hcl 4 Mg Tabs (Ondansetron hcl) .... One bymouth every 8 hours as needed for nausea  Current Allergies (reviewed today): ! * IRON IV/PO ! * SINGULAIR ! CIPRO ! PCN ! ERYTHROMYCIN ! SULFA ! ROCEPHIN ! FLAGYL ! * BYSTOLIC ! BETA BLOCKERS ! * ANTIEMETICS ! REGLAN ! * ANTIINFLAMMATORIES ! TRIFLUOPERAZINE HCL (TRIFLUOPERAZINE HCL) ! BUTORPHANOL TARTRATE (BUTORPHANOL TARTRATE) ! LISINOPRIL ! CLONIDINE HCL (CLONIDINE HCL) ! * ORAL STEROIDS ! MACROBID (NITROFURANTOIN MONOHYD MACRO) ZOLOFT EFFEXOR PROZAC PAXIL ADHESIVE TAPE

## 2010-09-13 NOTE — Assessment & Plan Note (Signed)
Summary: Left shoulder pain, Mood, joint pain   Vital Signs:  Patient profile:   45 year old female Height:      62 inches Weight:      169 pounds Pulse rate:   109 / minute BP sitting:   140 / 81  (left arm) Cuff size:   regular  Vitals Entered By: Kathlene November (September 06, 2009 2:20 PM) CC: nausea, H/A and shoulder pain no better   Primary Care Provider:  Nani Gasser MD  CC:  nausea and H/A and shoulder pain no better.  History of Present Illness: nausea, H/A and shoulder pain no better. Went to ED later the day I saw her and they referred her to [facility] and they did an injection of steroid into her shoulder. Still having severe pain. Didn't tolerate the steroids very well. BP eleveted today. Feltlike it made her "crazy" as far as her mood.  Sasys somet ime pain in in the axilla and radiates into the left side of the chest and then shots all the way down he arm.  Feels very stressed. Has some nitroglycerin in her purse but has never used it.    Saw Dr. Stephan Minister (ortho) for her neck and back. Saw an associates who offered her an injection.   He then recommend she fu back with withDr Stephan Minister when he was in the office. This was very frustrating for her.    Current Medications (verified): 1)  Bentyl 10 Mg/13ml Syrp (Dicyclomine Hcl) .... 5ml Before Meals  As Needed 2)  Carafate 1 Gm/55ml Susp (Sucralfate) .Marland Kitchen.. 1 Teaspoon  Four Times A Day  As Needed -- On Hold 3)  Tylenol Extra Strength 1000 Mg/62ml Liqd (Acetaminophen) .... As Needed 4)  Nitroglycerin 0.4 Mg Subl (Nitroglycerin) .... One Tablet Under Tongue Every 5 Minutes As Needed For Chest Pain---May Repeat Times Three 5)  Nasonex 50 Mcg/act Susp (Mometasone Furoate) .... One- Two  Spray Each Nostril Once Daily. 6)  Aerochamber Mv  Misc (Spacer/aero-Holding Chambers) .... Use With Xopenex 7)  Xopenex Hfa 45 Mcg/act Aero (Levalbuterol Tartrate) .... Every 6 Hours 8)  Voltaren 1 % Gel (Diclofenac Sodium) .... Apply To Area As Needed 9)   Mylanta 200-200-20 Mg/4ml Susp (Alum & Mag Hydroxide-Simeth) .... As Needed --- On Hold 10)  Ativan 0.5 Mg Tabs (Lorazepam) .... 1/2-1 Tab By Mouth Every 6 Hours As Needed Anxiety 11)  Ondansetron Hcl 4 Mg Tabs (Ondansetron Hcl) .Marland Kitchen.. 1-2 Every Night 12)  Terbinafine Hcl 250 Mg Tabs (Terbinafine Hcl) .... Take 1 Tablet By Mouth Once A Day For 12 Weeks. 13)  Cephalexin 500 Mg Caps (Cephalexin) .... Take 1 Tablet By Mouth Two Times A Day For 3 Days. 14)  Fluconazole 150 Mg Tabs (Fluconazole) .... Take 1 Tablet By Mouth Once A Day X 1 15)  Lidoderm 5 % Ptch (Lidocaine) .... Apply For Up To 12 Hours A Day.  Max of 3 Patches At One Time.  Allergies (verified): 1)  ! * Iron Iv/po 2)  ! * Singulair 3)  ! Cipro 4)  ! Pcn 5)  ! Erythromycin 6)  ! Sulfa 7)  ! Rocephin 8)  ! Flagyl 9)  ! * Bystolic 10)  ! Beta Blockers 11)  ! * Antiemetics 12)  ! Reglan 13)  ! * Antiinflammatories 14)  ! Trifluoperazine Hcl (Trifluoperazine Hcl) 15)  ! Butorphanol Tartrate (Butorphanol Tartrate) 16)  ! Lisinopril 17)  ! Clonidine Hcl (Clonidine Hcl) 18)  ! * Oral Steroids 19)  !  Macrobid (Nitrofurantoin Monohyd Macro) 20)  Zoloft 21)  Effexor 22)  Prozac 23)  Paxil 24)  Adhesive Tape  Comments:  Nurse/Medical Assistant: The patient's medications and allergies were reviewed with the patient and were updated in the Medication and Allergy Lists. Kathlene November (September 06, 2009 2:20 PM)  Physical Exam  General:  Well-developed,well-nourished,in no acute distress; alert,appropriate and cooperative throughout examination Lungs:  Normal respiratory effort, chest expands symmetrically. Lungs are clear to auscultation, no crackles or wheezes. Heart:  Normal rate and regular rhythm. S1 and S2 normal without gallop, murmur, click, rub or other extra sounds. Msk:  Left shoulder with NROM.    Impression & Recommendations:  Problem # 1:  SHOULDER PAIN, LEFT (ICD-719.41) Discussed that since the injection  didn't work that she really needs to fu with ortho for further evaluation. My understanding is that they have discussed surgery at one point. Pt doesn't want to take narcotics or NSAIDs so can stick wiht her tylenol for now. also discussed that I don't think this is related to her heart. The pain she somtimes gets into her chest that radiates all the way down her arm I feel is different. Fot that pain I recommend she try one of the NTG tabs she carries in her purse (when has someone around) to see if it helps.   Her updated medication list for this problem includes:    Tylenol Extra Strength 1000 Mg/34ml Liqd (Acetaminophen) .Marland Kitchen... As needed  Problem # 2:  POLYARTHRITIS (ICD-719.49) Will take over teh referral process for rheumatology.  Has been waiting on ortho to get her in for almost 2 months ans still hasn't heard back so will try to schedule ourselves.   Problem # 3:  DYSTHYMIC DISORDER (ICD-300.4) We had a very long discussion today about making life choices adn then learning to live with those choices. Also discussed that if she feels her therapist is no longer helping her then we can refer her to someone new if she would like. She is very frustrated by the medical system but I let her know that since she is intolerant of so many medications that often the standard treaetment can't be used for her. Sometimes there are othe options that might help but might not fix the problems adn sometimes there are no other treatment options on the table adn this is why some medical providers get easily frustrated wtih her. Explained she is really going to have to focus on teh things in her life that help her and move past those things that don't. Even during this conversation she kept trying to change th subjest instead of addressing the issues at hand. 30 min were spent face to face in scounseling.   Complete Medication List: 1)  Bentyl 10 Mg/66ml Syrp (Dicyclomine hcl) .... 5ml before meals  as needed 2)   Carafate 1 Gm/80ml Susp (Sucralfate) .Marland Kitchen.. 1 teaspoon  four times a day  as needed -- on hold 3)  Tylenol Extra Strength 1000 Mg/13ml Liqd (Acetaminophen) .... As needed 4)  Nitroglycerin 0.4 Mg Subl (Nitroglycerin) .... One tablet under tongue every 5 minutes as needed for chest pain---may repeat times three 5)  Nasonex 50 Mcg/act Susp (Mometasone furoate) .... One- two  spray each nostril once daily. 6)  Aerochamber Mv Misc (Spacer/aero-holding chambers) .... Use with xopenex 7)  Xopenex Hfa 45 Mcg/act Aero (Levalbuterol tartrate) .... Every 6 hours 8)  Voltaren 1 % Gel (Diclofenac sodium) .... Apply to area as needed 9)  Mylanta  200-200-20 Mg/49ml Susp (Alum & mag hydroxide-simeth) .... As needed --- on hold 10)  Ativan 0.5 Mg Tabs (Lorazepam) .... 1/2-1 tab by mouth every 6 hours as needed anxiety 11)  Ondansetron Hcl 4 Mg Tabs (Ondansetron hcl) .Marland Kitchen.. 1-2 every night 12)  Terbinafine Hcl 250 Mg Tabs (Terbinafine hcl) .... Take 1 tablet by mouth once a day for 12 weeks. 13)  Cephalexin 500 Mg Caps (Cephalexin) .... Take 1 tablet by mouth two times a day for 3 days. 14)  Fluconazole 150 Mg Tabs (Fluconazole) .... Take 1 tablet by mouth once a day x 1 15)  Lidoderm 5 % Ptch (Lidocaine) .... Apply for up to 12 hours a day.  max of 3 patches at one time.  Other Orders: Rheumatology Referral (Rheumatology)

## 2010-09-13 NOTE — Progress Notes (Signed)
Summary: pt. wants her lab result back - jr  Phone Note Call from Patient   Caller: Patient Summary of Call: Please call pt back at [phone] and let her know her D-Dimer is back and what the Value is so she does not continue to worry. Thanks.Michaelle Copas  May 20, 2010 2:20 PM  Initial call taken by: Michaelle Copas,  May 20, 2010 2:20 PM  Follow-up for Phone Call        called pt and notifed her of results Follow-up by: Avon Gully CMA, Duncan Dull),  May 20, 2010 4:52 PM
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
## 2010-09-13 NOTE — Assessment & Plan Note (Signed)
Summary: Tongue is white, shoulder pain   Vital Signs:  Patient profile:   45 year old female Height:      62 inches Weight:      173 pounds Pulse rate:   97 / minute BP sitting:   156 / 105  (left arm) Cuff size:   regular  Vitals Entered By: Avon Gully CMA, (AAMA) (March 14, 2010 11:02 AM) CC: tongue whit e the day after taking abx ws swollen, better after drinking water   Primary Care Provider:  Nani Gasser MD  CC:  tongue whit e the day after taking abx ws swollen and better after drinking water.  History of Present Illness: Took one dose of the doxy and the next AM woke up with feeling her tongue was swollen and  coated white. She tried to drink more fluid thinking she was dry.  No pain or itching.  No redness.  Glands still feel swollen.   Stil having painin her left shoulder. It is still very painful to reach above her head. The worst position is the layflat with heer arm above her head. She feels is popping instead. No clicking or clunking.  She feels overall her shoulder is getting worse.  Had her granduaghter this weekend and by the end of the weekend saysher arm felt weak.  Had MRI 1-2 years ago at [facility] and they saw some swelling in the [facility] joint at that time.  Currently her pain if over the outside of her shoulder.  She is seeing ortho for her back which she is also having pain and problems with. Has been in PT but feels she is very limited and PT has recommend she follow back up with ortho.  It sounds like she is having a lot of stiffness wtih bending and stooping and sitting for prolonged periods. No pain relievers.    Current Medications (verified): 1)  Bentyl 10 Mg/30ml Syrp (Dicyclomine Hcl) .... 5ml Before Meals  As Needed 2)  Tylenol Childrens 160 Mg/29ml Susp (Acetaminophen) .... Per Bottle 3)  Xopenex Hfa 45 Mcg/act Aero (Levalbuterol Tartrate) .... Every 6 Hours Pls Fill ,  This Pt Cannot Tolerate Albuterol Due To Tachycardia 4)  Maalox 600  Mg Chew (Calcium Carbonate Antacid) .... Per Bottle 5)  Ativan 1 Mg Tabs (Lorazepam) .... As Needed 6)  Lidoderm 5 % Ptch (Lidocaine) .... As Needed 7)  Nasonex 50 Mcg/act Susp (Mometasone Furoate) .... Two Sprays Each Nostril Once Daily 8)  Straight Cane .... Dx: Vertigo, Sacroilitis 9)  Clotrimazole-Betamethasone 1-0.05 % Crea (Clotrimazole-Betamethasone) .... Apply Two Times A Day To Rectal Area As Needed Irritation 10)  Voltaren 1 % Gel (Diclofenac Sodium) .... As Needed Arthritis Pain 11)  Doxycycline Hyclate 50 Mg Caps (Doxycycline Hyclate) .... Take 1 Tablet By Mouth Two Times A Day For 7 Days 12)  Metrogel-Vaginal 0.75 % Gel (Metronidazole) .Marland Kitchen.. 1 Applicator Full Pv At Bedtime X 5 Days.  Allergies (verified): 1)  ! * Iron Iv/po 2)  ! * Singulair 3)  ! Cipro 4)  ! Pcn 5)  ! Erythromycin 6)  ! Sulfa 7)  ! Rocephin 8)  ! Flagyl 9)  ! * Bystolic 10)  ! Beta Blockers 11)  ! * Antiemetics 12)  ! Reglan 13)  ! * Antiinflammatories 14)  ! Trifluoperazine Hcl (Trifluoperazine Hcl) 15)  ! Butorphanol Tartrate (Butorphanol Tartrate) 16)  ! Lisinopril 17)  ! Clonidine Hcl (Clonidine Hcl) 18)  ! * Oral Steroids 19)  !  Macrobid (Nitrofurantoin Monohyd Macro) 20)  Zoloft 21)  Effexor 22)  Prozac 23)  Paxil 24)  Adhesive Tape  Comments:  Nurse/Medical Assistant: The patient's medications and allergies were reviewed with the patient and were updated in the Medication and Allergy Lists. Avon Gully CMA, Duncan Dull) (March 14, 2010 11:05 AM)  Physical Exam  General:  Well-developed,well-nourished,in no acute distress; alert,appropriate and cooperative throughout examination Head:  Normocephalic and atraumatic without obvious abnormalities. No apparent alopecia or balding. Heart:  Normal rate and regular rhythm. S1 and S2 normal without gallop, murmur, click, rub or other extra sounds. Msk:  Left shoulder with NROM but pan with fullextension above the head.  Sterngth 5/5 in the  shoulder, elbow, and wrist and thumb.     Impression & Recommendations:  Problem # 1:  GLOSSITIS (ICD-529.0) Her tongue exam is normal. reassured her. Maybe she was just midly dehydrated.  Hearing the story I don't think this was an allergic reaction to the doxy. Can restart if she would like but monitor for similar sxs.    Problem # 2:  SHOULDER PAIN, LEFT (ICD-719.41)  Says it is getting more painful. Can use her Tyelnol prn and icing.   More pain trying to raise her left arm above the shoulder level. She doesn't toleate meds for inflammation.  Rec repeat MRI for further evaluation.  Will sched.  Her updated medication list for this problem includes:    Tylenol Childrens 160 Mg/17ml Susp (Acetaminophen) .Marland Kitchen... Per bottle  Orders: T-*Unlisted MRI Procedure (16109)  Complete Medication List: 1)  Bentyl 10 Mg/23ml Syrp (Dicyclomine hcl) .... 5ml before meals  as needed 2)  Tylenol Childrens 160 Mg/51ml Susp (Acetaminophen) .... Per bottle 3)  Xopenex Hfa 45 Mcg/act Aero (Levalbuterol tartrate) .... Every 6 hours pls fill ,  this pt cannot tolerate albuterol due to tachycardia 4)  Maalox 600 Mg Chew (Calcium carbonate antacid) .... Per bottle 5)  Ativan 1 Mg Tabs (Lorazepam) .... As needed 6)  Lidoderm 5 % Ptch (Lidocaine) .... As needed 7)  Nasonex 50 Mcg/act Susp (Mometasone furoate) .... Two sprays each nostril once daily 8)  Straight Cane  .... Dx: vertigo, sacroilitis 9)  Clotrimazole-betamethasone 1-0.05 % Crea (Clotrimazole-betamethasone) .... Apply two times a day to rectal area as needed irritation 10)  Voltaren 1 % Gel (Diclofenac sodium) .... As needed arthritis pain 11)  Doxycycline Hyclate 50 Mg Caps (Doxycycline hyclate) .... Take 1 tablet by mouth two times a day for 7 days 12)  Metrogel-vaginal 0.75 % Gel (Metronidazole) .Marland Kitchen.. 1 applicator full pv at bedtime x 5 days.  Other Orders: T-Vitamin D (25-Hydroxy) [phone])

## 2010-09-13 NOTE — Miscellaneous (Signed)
  Clinical Lists Changes  Orders: Added new Service order of EKG w/ Interpretation (93000) - Signed 

## 2010-09-13 NOTE — Consult Note (Signed)
[facility]   Imported By: Lanelle Bal 03/14/2010 11:25:43  _____________________________________________________________________  External Attachment:    Type:   Image     Comment:   External Document

## 2010-09-13 NOTE — Assessment & Plan Note (Signed)
Summary: Multiple issues   Vital Signs:  Patient profile:   45 year old female Height:      62 inches Weight:      168 pounds Pulse rate:   111 / minute BP sitting:   134 / 80  (left arm) Cuff size:   regular  Vitals Entered By: Kathlene November (December 01, 2009 11:11 AM) CC: Bp elevated- dizzy spells   Primary Care Provider:  Nani Gasser MD  CC:  Bp elevated- dizzy spells.  History of Present Illness: Saw her psych adn he recommend Zyprexa but she is very fearful  of potential reactions so is not gonig to take it . Feels like he is not listening to her. Still occ seeing [facility] for counseling. She had called to ask for referral to psych here.    BP was running over 100 for almost a week.  BP were different in each arm and they were initally worried about coarctation of the aorta.  Went to cardiology and they saw normal CT from December.    needs f/u on noduel on her vocal cords.   Still feeling very dizzy when gets out.  Has been having some sinus pain.; Worse over the Left maxillary sinus.  Has been using her Astepro but feels like it is too drying.   Current Medications (verified): 1)  Bentyl 10 Mg/23ml Syrp (Dicyclomine Hcl) .... 5ml Before Meals  As Needed 2)  Carafate 1 Gm/8ml Susp (Sucralfate) .Marland Kitchen.. 1 Teaspoon  Four Times A Day  As Needed 3)  Tylenol Childrens 160 Mg/33ml Susp (Acetaminophen) .... Per Bottle 4)  Nitroglycerin 0.4 Mg Subl (Nitroglycerin) .... One Tablet Under Tongue Every 5 Minutes As Needed For Chest Pain---May Repeat Times Three 5)  Aerochamber Mv  Misc (Spacer/aero-Holding Chambers) .... Use With Xopenex 6)  Xopenex Hfa 45 Mcg/act Aero (Levalbuterol Tartrate) .... Every 6 Hours Pls Fill ,  This Pt Cannot Tolerate Albuterol Due To Tachycardia 7)  Maalox 600 Mg Chew (Calcium Carbonate Antacid) .... Per Bottle 8)  Ativan 1 Mg Tabs (Lorazepam) .... As Needed 9)  Lidoderm 5 % Ptch (Lidocaine) .... As Needed 10)  Restasis 0.05 % Emul (Cyclosporine) ....  Uad 11)  Lopressor 50 Mg Tabs (Metoprolol Tartrate) .... 1/2 Tablet Daily As Needed 12)  Nasonex 50 Mcg/act Susp (Mometasone Furoate) .... Two Sprays Each Nostril Once Daily 13)  Straight Cane .... Dx: Vertigo, Sacroilitis 14)  Metrogel-Vaginal 0.75 % Gel (Metronidazole) .Marland Kitchen.. 1 Gram Applicaotr Full Pv At Bedtime For 5 Nights 15)  Clotrimazole-Betamethasone 1-0.05 % Crea (Clotrimazole-Betamethasone) .... Apply Two Times A Day To Rectal Area As Needed Irritation 16)  Voltaren 1 % Gel (Diclofenac Sodium) .... As Needed Arthritis Pain 17)  Astepro 0.15 % Soln (Azelastine Hcl) .... Two Sprays Each Nostril Daily  Allergies (verified): 1)  ! * Iron Iv/po 2)  ! * Singulair 3)  ! Cipro 4)  ! Pcn 5)  ! Erythromycin 6)  ! Sulfa 7)  ! Rocephin 8)  ! Flagyl 9)  ! * Bystolic 10)  ! Beta Blockers 11)  ! * Antiemetics 12)  ! Reglan 13)  ! * Antiinflammatories 14)  ! Trifluoperazine Hcl (Trifluoperazine Hcl) 15)  ! Butorphanol Tartrate (Butorphanol Tartrate) 16)  ! Lisinopril 17)  ! Clonidine Hcl (Clonidine Hcl) 18)  ! * Oral Steroids 19)  ! Macrobid (Nitrofurantoin Monohyd Macro) 20)  Zoloft 21)  Effexor 22)  Prozac 23)  Paxil 24)  Adhesive Tape  Comments:  Nurse/Medical Assistant: The patient's  medications and allergies were reviewed with the patient and were updated in the Medication and Allergy Lists. Kathlene November (December 01, 2009 11:12 AM)  Physical Exam  General:  Well-developed,well-nourished,in no acute distress; alert,appropriate and cooperative throughout examination Head:  Normocephalic and atraumatic without obvious abnormalities. No apparent alopecia or balding. Eyes:  No corneal or conjunctival inflammation noted. EOMI. Perrla.  Ears:  External ear exam shows no significant lesions or deformities.  Otoscopic examination reveals clear canals, tympanic membranes are intact bilaterally without bulging, retraction, inflammation or discharge. Hearing is grossly normal  bilaterally. Nose:  External nasal examination shows no deformity or inflammation. Mouth:  Oral mucosa and oropharynx without lesions or exudates.  Teeth in good repair. Neck:  No deformities, masses, or tenderness noted. Lungs:  Normal respiratory effort, chest expands symmetrically. Lungs are clear to auscultation, no crackles or wheezes. Heart:  Normal rate and regular rhythm. S1 and S2 normal without gallop, murmur, click, rub or other extra sounds. Skin:  no rashes.   Cervical Nodes:  No lymphadenopathy noted Psych:  Talking quickly.  Tearful. Clearly stressed.    Impression & Recommendations:  Problem # 1:  DIZZINESS (ICD-780.4) Discussed that this is a chronic issue and has been worked up in [employer]. I do feel it is exacerbated by her anxiety and depression and she has alot going on in her life right now. Will get labs to make sure her electrolyte are normal. She is specifically worried about her potassium.  Orders: T-Comprehensive Metabolic Panel ([id number])  Problem # 2:  SPRAIN AND STRAIN OF LUMBOSACRAL (ICD-846.0) Pt was talking and stepping backwards and bent to sit down but wasn't paying attention and missed the chair. Landed on the her cane that was leaning against the wall and then landed with her bottomon the floor. I helped her up.  The skin tear but some redness over the sacrum. Let her know likely will have a bruise and some swelling.  Recommend sitting on a cushion for a few days.  Given liquid Tylenol for acute pain relief. I fpain persists for several days then consider xray thought expaline often unable to really treat sacral fractures.    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  Orders: ENT Referral (ENT)  Complete Medication List: 1)  Bentyl 10 Mg/18ml Syrp (Dicyclomine hcl) .... 5ml before meals  as needed 2)  Carafate 1 Gm/28ml Susp (Sucralfate) .Marland Kitchen.. 1 teaspoon  four times a day  as needed 3)  Tylenol Childrens 160 Mg/82ml Susp (Acetaminophen) .... Per bottle 4)  Nitroglycerin 0.4 Mg Subl (Nitroglycerin) .... One tablet under tongue every 5 minutes as needed for chest pain---may repeat times three 5)  Aerochamber Mv Misc (Spacer/aero-holding chambers) .... Use with xopenex 6)  Xopenex Hfa 45 Mcg/act Aero (Levalbuterol tartrate) .... Every 6 hours pls fill ,  this pt cannot tolerate albuterol due to tachycardia 7)  Maalox 600 Mg Chew (Calcium carbonate antacid) .... Per bottle 8)  Ativan 1 Mg  Tabs (Lorazepam) .... As needed 9)  Lidoderm 5 % Ptch (Lidocaine) .... As needed 10)  Restasis 0.05 % Emul (Cyclosporine) .... Uad 11)  Lopressor 50 Mg Tabs (Metoprolol tartrate) .... 1/2 tablet daily as needed 12)  Nasonex 50 Mcg/act Susp (Mometasone furoate) .... Two sprays each nostril once daily 13)  Straight Cane  .... Dx: vertigo, sacroilitis 14)  Metrogel-vaginal 0.75 % Gel (Metronidazole) .Marland Kitchen.. 1 gram applicaotr full pv at bedtime for 5 nights 15)  Clotrimazole-betamethasone 1-0.05 % Crea (Clotrimazole-betamethasone) .... Apply two times a day to rectal area as needed irritation 16)  Voltaren 1 % Gel (Diclofenac sodium) .... As needed arthritis pain 17)  Astepro 0.15 % Soln (Azelastine hcl) .... Two sprays each nostril daily

## 2010-09-13 NOTE — Progress Notes (Signed)
Summary: nausea  Phone Note Call from Patient   Caller: Patient Call For: Nani Gasser MD Summary of Call: pt called and states she is nauseaous and wants zofran called in for her nausea 2 pills a day because her insurance will require a PA for anything more Initial call taken by: Avon Gully CMA, Duncan Dull),  March 23, 2010 2:52 PM  Follow-up for Phone Call        Rx Called In Follow-up by: Nani Gasser MD,  March 24, 2010 8:01 AM  Additional Follow-up for Phone Call Additional follow up Details #1::        pt notified;left message in vm Additional Follow-up by: Avon Gully CMA, Duncan Dull),  March 24, 2010 8:54 AM    New/Updated Medications: ZOFRAN 4 MG TABS (ONDANSETRON HCL) Take 1 tablet by mouth two times a day as needed nausea Prescriptions: ZOFRAN 4 MG TABS (ONDANSETRON HCL) Take 1 tablet by mouth two times a day as needed nausea  #60 x 0   Entered and Authorized by:   Nani Gasser MD   Signed by:   Nani Gasser MD on 03/24/2010   Method used:   Electronically to        [facility] # [phone]* (retail)       [address] [place], [place]  [address]       Ph: [phone]       Fax: [phone]   RxID:   [id number]

## 2010-09-13 NOTE — Assessment & Plan Note (Signed)
Summary: walk in chest pain/jr  Nurse Visit   Vital Signs:  Patient profile:   45 year old female Pulse (ortho):   90 / minute BP supine:   120 / 82  (left arm) BP standing:   120 / 82  Serial Vital Signs/Assessments:  Time      Position  BP       Pulse  Resp  Temp     By           Lying RA  120/82   70                    Layne Benton, RN, BSN           Sitting   130/82   78                    Layne Benton, Charity fundraiser, BSN           Standing  120/82   90                    Layne Benton, Charity fundraiser, BSN  Comments: 3 minute standing BP130/82 Pulse 86  By: Layne Benton, RN, BSN    Referring Provider:  Alanson Aly (EDP @ [facility]) Primary Provider:  Nani Gasser MD  CC:  chest pain and palps.  History of Present Illness: Patient walked into clinic without an appointment , complaining of elevated BP at home, headaches,chest pain on and off times 2 weeks, SOB and nausea on and off, and palps. She saw her primary care doctor yesterday (Dr. Linford Arnold) with all the same complaints. 12 lead ekg and orthostatic BP's done.Fasting Lipid panel drawn and return office visit made with Dr.Kalani Baray. Advised patient to stay hydrated . She states that she does not want to take a beta blocker for palps as previously advised by Dr.Annette Liotta because her BP was too low when she tried it 10 years ago. Advised her to revisit this issue at her next appointment with Dr.Tomeeka Plaugher to consider as needed use for palps with chest pain.   Allergies: 1)  ! * Iron Iv/po 2)  ! * Singulair 3)  ! Cipro 4)  ! Pcn 5)  ! Erythromycin 6)  ! Sulfa 7)  ! Rocephin 8)  ! Flagyl 9)  ! * Bystolic 10)  ! Beta Blockers 11)  ! * Antiemetics 12)  ! Reglan 13)  ! * Antiinflammatories 14)  ! Trifluoperazine Hcl (Trifluoperazine Hcl) 15)  ! Butorphanol Tartrate (Butorphanol Tartrate) 16)  ! Lisinopril 17)  ! Clonidine Hcl (Clonidine Hcl) 18)  ! * Oral Steroids 19)  ! Macrobid (Nitrofurantoin Monohyd Macro) 20)  Zoloft 21)   Effexor 22)  Prozac 23)  Paxil 24)  Adhesive Tape
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
## 2010-09-13 NOTE — Letter (Signed)
Summary: Primary Care Consult Scheduled Letter  [place] [facility]  [address]   [place], [place] [address]   Phone: [phone]  Fax: [phone]      09/16/2009 MRN: [id number]  Jennifer [name] [address] [place], [place]  [address]    Dear [name],      We have scheduled an appointment for you.  At the recommendation of Dr.METHENEY, we have scheduled you a consult with [facility], DR Cardell Peach  on FEBRUARY 9,2011  at 1:15PM .  Their address is_[address] ,[place] . The office phone number is [phone].  If this appointment day and time is not convenient for you, please feel free to call the office of the doctor you are being referred to at the number listed above and reschedule the appointment.     It is important for you to keep your scheduled appointments. We are here to make sure you are given good patient care. If you have questions or you have made changes to your appointment, please notify us at  [phone], ask for HELEN.    Thank you,  Patient Care Coordinator [place] at [facility]

## 2010-09-13 NOTE — Progress Notes (Signed)
Summary: meds  Phone Note Call from Patient   Caller: Patient Call For: Nani Gasser MD Summary of Call: Jennifer [name] called and said you were going to send in a BP pill but I didnt see it in her chart Initial call taken by: Avon Gully CMA, Duncan Dull),  June 07, 2010 2:57 PM  Follow-up for Phone Call        Sorry, I sent is now.  Follow-up by: Nani Gasser MD,  June 07, 2010 3:37 PM    New/Updated Medications: VERAPAMIL HCL 40 MG TABS (VERAPAMIL HCL) Take 1 tablet by mouth two times a day Prescriptions: VERAPAMIL HCL 40 MG TABS (VERAPAMIL HCL) Take 1 tablet by mouth two times a day  #60 x 0   Entered and Authorized by:   Nani Gasser MD   Signed by:   Nani Gasser MD on 06/07/2010   Method used:   Electronically to        [facility] # [phone]* (retail)       [address] [place], [place]  [address]       Ph: [phone]       Fax: [phone]   RxID:   [id number]

## 2010-09-13 NOTE — Progress Notes (Signed)
Summary: ONO results  Phone Note Outgoing Call   Reason for Call: Discuss lab or test results Summary of Call: call the pt and tell her the oximetry test in the home was normal,  she does not need cpap or oxygen at night Initial call taken by: Storm Frisk MD,  October 07, 2009 1:49 PM  Follow-up for Phone Call        called, spoke with pt.  Informed her of above results and recs per PW-she verbalized understanding and had no questions.   Follow-up by: Gweneth Dimitri RN,  October 07, 2009 3:10 PM

## 2010-09-13 NOTE — Consult Note (Signed)
Summary: [facility]  [facility]   Imported By: Lanelle Bal 07/05/2010 13:19:17  _____________________________________________________________________  External Attachment:    Type:   Image     Comment:   External Document
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
## 2010-09-13 NOTE — Miscellaneous (Signed)
Summary: ONO RA  Clinical Lists Changes  Observations: Added new observation of SLEEP STUDY: Low Oxygen Sat: 84% Oximetry overnight on           RA       =  94%   only 0.5 min with sat < 88%  (10/03/2009 13:48)      Sleep Study  Procedure date:  10/03/2009  Findings:      Low Oxygen Sat: 84% Oximetry overnight on           RA       =  94%   only 0.5 min with sat < 88%   Appended Document: ONO RA result noted  patient aware

## 2010-09-13 NOTE — Progress Notes (Signed)
Summary: diff breathing/ swallowing  Phone Note Call from Patient   Caller: Patient Call For: wright Summary of Call: diff breathing/ swallowing. i advised pt to call her primary doctor or go to er/ urgent care if she couldn't wait to see pw tomorrow, but she wants to speak to a nurse now. [phone]. Initial call taken by: Tivis Ringer,  May 19, 2008 9:25 AM  Follow-up for Phone Call        called and spoke with pt. pt states she is already on her way to [facility] ED due to her " severe sore throat" and chest pain.  Cyndia Diver LPN  May 19, 2008 10:31 AM

## 2010-09-13 NOTE — Progress Notes (Signed)
Summary: EMG results  Phone Note Outgoing Call   Summary of Call: Call pt: EMG was normal. No cervical radiculopathy or neuropathy.  Initial call taken by: Nani Gasser MD,  October 13, 2009 2:14 PM  Follow-up for Phone Call        Left VM of normal results. KJ LPN Follow-up by: Kathlene November,  October 13, 2009 2:41 PM

## 2010-09-13 NOTE — Progress Notes (Signed)
Summary: Wants EMG  Phone Note Call from Patient Call back at Home Phone [phone]   Caller: Patient Summary of Call: Pt called and said all they have her scheduled for just the lower arm and they need a EMG so it does pt neck and shoulder. Send to [phone] [facility] Initial call taken by: Kathlene November,  October 01, 2009 11:44 AM  Follow-up for Phone Call        Faxed order. Follow-up by: Kathlene November,  October 01, 2009 1:04 PM

## 2010-09-13 NOTE — Letter (Signed)
Summary: [facility]  [facility]   Imported By: Lanelle Bal 03/02/2010 12:31:28  _____________________________________________________________________  External Attachment:    Type:   Image     Comment:   External Document

## 2010-09-13 NOTE — Letter (Signed)
Summary: Generic Letter  [name]  [address], [address]   [place], [place] [address]   Phone: [phone]  Fax: [phone]    05/24/2010  To Whom It May Concern, regarding:  Jaeda Rothery [address] [place], [place]  [address]  Ms. Below has a history of anxiety and doesn't do well in crowds. Please excuse her from jury duty. I don't think she would be of a capacity to perform well in such an environment. If you have any further questions please feel free to call our office.    Sincerely,   Nani Gasser MD

## 2010-09-13 NOTE — Progress Notes (Signed)
Summary: shoulder pain  Phone Note Call from Patient Call back at Work Phone [phone]   Summary of Call: Patient called the office crying due to  severe shoulder pain that radiates accross her back/neck and increased BP. Per the patient the pain began 1 week ago, but is steadily getting worse. Patient has been to the ER and they checked to see if it was coming from her neck, but it was not. Patient denies strees/injury to the shoulder. Patient called earlier today and was told that MD does not have any appts. ?Can patient be worked in tomorrow? Please advise. Initial call taken by: Lucious Groves,  August 31, 2009 3:18 PM  Follow-up for Phone Call        Yes, can she come in at 8AM?  Follow-up by: Nani Gasser MD,  August 31, 2009 3:47 PM  Additional Follow-up for Phone Call Additional follow up Details #1::        Patient notified and will come in for 8 am appt. Additional Follow-up by: Lucious Groves,  August 31, 2009 3:58 PM    Additional Follow-up for Phone Call Additional follow up Details #2::    Just let vanesa know to put her on the schedule. Thanks.  Follow-up by: Nani Gasser MD,  August 31, 2009 4:35 PM

## 2010-09-13 NOTE — Progress Notes (Signed)
Summary: Bp elevated  Phone Note Call from Patient Call back at [facility] Phone [phone] Call back at cell # [phone]   Caller: Patient Call For: Nani Gasser MD Summary of Call: Norvasc seems to be causing the tachycardia so was told to stop this and BP elevated 150 systolic and 90-100 diastolic, feels terible. Wanted to know if there was a BP med she could tolerate that wouldn't make her dizzy and tachycardic. Heart rate is normal, H/A.  Also asked about getting an MRI on left shoulder- states hasn't heard about appt yet Initial call taken by: Kathlene November LPN,  May 17, 2010 12:02 PM  Follow-up for Phone Call        Not sure where we are in process if getting recert for her shoulder. Cab try low dose metoprolol. If OK can send 30 tabs.  Follow-up by: Nani Gasser MD,  May 17, 2010 12:57 PM  Additional Follow-up for Phone Call Additional follow up Details #1::        Pt doesn't want that med has it and is only supposed to take it when tachycardic Additional Follow-up by: Kathlene November LPN,  May 17, 2010 1:10 PM    Additional Follow-up for Phone Call Additional follow up Details #2::    Yes but can take daily to prevent the tachycardia. Cna even start with 1/2 tab two times a day  Follow-up by: Nani Gasser MD,  May 17, 2010 1:42 PM  Additional Follow-up for Phone Call Additional follow up Details #3:: Details for Additional Follow-up Action Taken: Pt notified of above. She states she will try it and if makes her feel funny she will call back. Additional Follow-up by: Kathlene November LPN,  May 17, 2010 2:13 PM  New/Updated Medications: METOPROLOL TARTRATE 25 MG TABS (METOPROLOL TARTRATE) Take 1 tablet by mouth once a day in the AM

## 2010-09-13 NOTE — Progress Notes (Signed)
Summary: shoulder MRI results.   Phone Note Outgoing Call   Summary of Call: Call pt: Has some mild tendinosis of teh anterior supraspinatou tendoin. Mild arthritis in teh [facility] joint and a small bone spur. Recommend rehab for shoulder or can see ortho for further tx options.  Initial call taken by: Nani Gasser MD,  May 25, 2010 1:24 PM  Follow-up for Phone Call        called and left messag with results on pt's cell and told her to call back and let us know what she wanted to do out of the two options given Follow-up by: Avon Gully CMA, Duncan Dull),  May 26, 2010 9:07 AM

## 2010-09-13 NOTE — Progress Notes (Signed)
Summary: MRI  Phone Note Call from Patient Call back at Home Phone [phone]   Caller: Patient Call For: Nani Gasser MD Summary of Call: Needs new referral for a MRI to be set up at [facility]. Initial call taken by: Kathlene November,  May 09, 2010 10:54 AM  Follow-up for Phone Call        order sent and appt has been scheduled.left messge for pt for appt time  Follow-up by: Avon Gully CMA, Duncan Dull),  May 18, 2010 3:36 PM

## 2010-09-13 NOTE — Letter (Signed)
Summary: Meds & Instructions/[facility]  Meds & Instructions/[facility]   Imported By: Lanelle Bal 07/11/2010 08:07:50  _____________________________________________________________________  External Attachment:    Type:   Image     Comment:   External Document

## 2010-09-13 NOTE — Progress Notes (Signed)
Summary: left leg pain  Phone Note Call from Patient   Caller: Patient Summary of Call: Dr.Metheney   Call Back  [phone]  having left leg pain, she said she's not sure if Dr.Metheney wanrs to see her or just give her a Referral to see another Doctor Initial call taken by: Vanessa Swaziland,  October 25, 2009 9:47 AM  Follow-up for Phone Call        Pt c/o L knee and up to groin is throbbing pain causing nausea x last PM. No known injury. P/S 8. Pt would like to know what you advise.  Follow-up by: Payton Spark CMA,  October 25, 2009 10:06 AM  Additional Follow-up for Phone Call Additional follow up Details #1::        If new pain then I will see her. If old pain then needs to see ortho.   Additional Follow-up by: Nani Gasser MD,  October 25, 2009 11:37 AM    Additional Follow-up for Phone Call Additional follow up Details #2::    [facility] for Pt to CB Follow-up by: Payton Spark CMA,  October 25, 2009 12:02 PM  Additional Follow-up for Phone Call Additional follow up Details #3:: Details for Additional Follow-up Action Taken: [facility] for Pt to CB. Closing note until Pt calls Additional Follow-up by: Payton Spark CMA,  October 25, 2009 2:56 PM
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
## 2010-09-13 NOTE — Progress Notes (Signed)
Summary: is requesting referrals  Phone Note Call from Patient   Summary of Call: [phone] Call pt, she is wondering if  she can have new referral for her existing specialist since she is changing insurance. Please call pt. and clearify what exactly is needed by our doctor.Michaelle Copas  Jan 06, 2010 3:59 PM  Initial call taken by: Michaelle Copas,  Jan 06, 2010 3:59 PM  Follow-up for Phone Call        Lm for pt to return call Follow-up by: Kathlene November,  Jan 07, 2010 8:18 AM

## 2010-09-13 NOTE — Letter (Signed)
Summary: [facility]  [facility]   Imported By: Lanelle Bal 12/20/2009 09:11:18  _____________________________________________________________________  External Attachment:    Type:   Image     Comment:   External Document
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
## 2010-09-13 NOTE — Progress Notes (Signed)
Summary: arm pain not getting better  Phone Note Call from Patient Call back at Home Phone [phone]   Caller: Patient Summary of Call: Please call pt., she states she has been seen for her arm pain but it is not getting any better and hurts really bad. She is thinking that maybe she needs a doppler performed. Call back pt.... Thank you Initial call taken by: Michaelle Copas,  November 22, 2009 8:53 AM  Follow-up for Phone Call        This is not a blood clot, based on her pain and the length of time.  She can f/u with her ortho again and let them know the steroid shot didn't help Follow-up by: Nani Gasser MD,  November 22, 2009 9:09 AM  Additional Follow-up for Phone Call Additional follow up Details #1::        Pt notified of MD instructions. Additional Follow-up by: Kathlene November,  November 22, 2009 9:28 AM

## 2010-09-13 NOTE — Assessment & Plan Note (Signed)
Summary: RASH(RED SPOT)   Vital Signs:  Patient profile:   45 year old female Height:      62 inches Weight:      174 pounds Pulse rate:   85 / minute BP sitting:   154 / 100  (right arm) Cuff size:   regular  Vitals Entered By: Avon Gully CMA, Duncan Dull) (May 04, 2010 11:40 AM) CC: rash in the groin   Primary Care Provider:  Nani Gasser MD  CC:  rash in the groin.  History of Present Illness: rash in the groin. Start several weeks ago with a spot on the right inner labia. it was irritated and itchy and now has a rash on both groin. No longer itchy but says now it is tender.  Tried using a steroid cream on it and it was a little better. Only used it for a few days.   Current Medications (verified): 1)  Tylenol 500 Mg/64ml Liqd (Acetaminophen) .... As Needed 2)  Xopenex Hfa 45 Mcg/act Aero (Levalbuterol Tartrate) .... Every 6 Hours Prn  Pls Fill ,  This Pt Cannot Tolerate Albuterol Due To Tachycardia 3)  Maalox 600 Mg Chew (Calcium Carbonate Antacid) .... Per Bottle 4)  Lidoderm 5 % Ptch (Lidocaine) .... As Needed 5)  Straight Cane .... Dx: Vertigo, Sacroilitis 6)  Zofran 4 Mg Tabs (Ondansetron Hcl) .... Take 1 Tablet By Mouth Two Times A Day As Needed Nausea 7)  Metoprolol Succinate 25 Mg Xr24h-Tab (Metoprolol Succinate) .... 1/2 Tab As Needed 8)  Norvasc 5 Mg Tabs (Amlodipine Besylate) .Marland Kitchen.. 1 Tab Every Day 9)  Patanase 0.6 % Soln (Olopatadine Hcl) .... As Needed 10)  Clotrimazole 1 % Crea (Clotrimazole) .... Pply Two Times A Day For 3 Weeks To External Vaginal Area.  Allergies (verified): 1)  ! * Iron Iv/po 2)  ! * Singulair 3)  ! Cipro 4)  ! Pcn 5)  ! Erythromycin 6)  ! Sulfa 7)  ! Rocephin 8)  ! Flagyl 9)  ! * Bystolic 10)  ! Beta Blockers 11)  ! * Antiemetics 12)  ! Reglan 13)  ! * Antiinflammatories 14)  ! Trifluoperazine Hcl (Trifluoperazine Hcl) 15)  ! Butorphanol Tartrate (Butorphanol Tartrate) 16)  ! Lisinopril 17)  ! Clonidine Hcl  (Clonidine Hcl) 18)  ! * Oral Steroids 19)  ! Macrobid (Nitrofurantoin Monohyd Macro) 20)  Zoloft 21)  Effexor 22)  Prozac 23)  Paxil 24)  Adhesive Tape  Comments:  Nurse/Medical Assistant: The patient's medications and allergies were reviewed with the patient and were updated in the Medication and Allergy Lists. Avon Gully CMA, Duncan Dull) (May 04, 2010 11:42 AM)  Physical Exam  General:  Well-developed,well-nourished,in no acute distress; alert,appropriate and cooperative throughout examination Genitalia:  Right inner labial with a circular pale area approx 2 cm large. Along the creases has some splotchy erythema and slightly dry scale. No drainage. Rash is macular.    Impression & Recommendations:  Problem # 1:  TINEA CRURIS (ICD-110.3) Treat with topical antifungal for 3 weeks. If not better then f/u.  Avoid intercourse for one week.   Complete Medication List: 1)  Tylenol 500 Mg/26ml Liqd (Acetaminophen) .... As needed 2)  Xopenex Hfa 45 Mcg/act Aero (Levalbuterol tartrate) .... Every 6 hours prn  pls fill ,  this pt cannot tolerate albuterol due to tachycardia 3)  Maalox 600 Mg Chew (Calcium carbonate antacid) .... Per bottle 4)  Lidoderm 5 % Ptch (Lidocaine) .... As needed 5)  Straight Cane  .Marland KitchenMarland KitchenMarland Kitchen  Dx: vertigo, sacroilitis 6)  Zofran 4 Mg Tabs (Ondansetron hcl) .... Take 1 tablet by mouth two times a day as needed nausea 7)  Metoprolol Succinate 25 Mg Xr24h-tab (Metoprolol succinate) .... 1/2 tab as needed 8)  Norvasc 5 Mg Tabs (Amlodipine besylate) .Marland Kitchen.. 1 tab every day 9)  Patanase 0.6 % Soln (Olopatadine hcl) .... As needed 10)  Clotrimazole 1 % Crea (Clotrimazole) .... Pply two times a day for 3 weeks to external vaginal area.  Patient Instructions: 1)  Use the cream for 3 weeks.  Prescriptions: CLOTRIMAZOLE 1 % CREA (CLOTRIMAZOLE) pply two times a day for 3 weeks to external vaginal area.  #1 tube x 0   Entered and Authorized by:   Nani Gasser MD    Signed by:   Nani Gasser MD on 05/04/2010   Method used:   Electronically to        [facility] # [id number]* (retail)       [address] [place], [place]  [address]       Ph: [phone]       Fax: [phone]   RxID:   [id number]

## 2010-09-13 NOTE — Letter (Signed)
Summary: [facility]  [facility]   Imported By: Sherian Rein 04/28/2010 10:44:57  _____________________________________________________________________  External Attachment:    Type:   Image     Comment:   External Document

## 2010-09-13 NOTE — Procedures (Signed)
Summary: [facility]oscopic Evaluation of Swallowing/[facility]  [facility]oscopic [facility] Swallowing/[facility]   Imported By: Maryln Gottron 07/21/2010 15:30:16  _____________________________________________________________________  External Attachment:    Type:   Image     Comment:   External Document

## 2010-09-13 NOTE — Progress Notes (Signed)
Summary: High BP  Phone Note Call from Patient   Summary of Call: Pt calle and states BP was 140/90 at the GI office and BP has been runnin high for the past few weeks. Please advise Initial call taken by: Avon Gully CMA, Duncan Dull),  March 15, 2010 12:48 PM  Follow-up for Phone Call        Workon low salt diet (DASH diet). I already eats low salt then recommend start BP Med. If doesn't want to start BP med then consider f/u wiht hr cardiologist.  Follow-up by: Nani Gasser MD,  March 15, 2010 12:54 PM  Additional Follow-up for Phone Call Additional follow up Details #1::        Send BP med to pharmarcy- wants a low dose Additional Follow-up by: Kathlene November,  March 15, 2010 2:38 PM    New/Updated Medications: LISINOPRIL 5 MG TABS (LISINOPRIL) Take 1 tablet by mouth once a day Prescriptions: LISINOPRIL 5 MG TABS (LISINOPRIL) Take 1 tablet by mouth once a day  #30 x 0   Entered and Authorized by:   Nani Gasser MD   Signed by:   Nani Gasser MD on 03/15/2010   Method used:   Electronically to        [facility] # [id number]* (retail)       [address] [place], [place]  [address]       Ph: [phone]       Fax: [phone]   RxID:   [id number]

## 2010-09-13 NOTE — Consult Note (Signed)
Summary: [facility]  [facility]   Imported By: Lanelle Bal 05/10/2010 12:13:25  _____________________________________________________________________  External Attachment:    Type:   Image     Comment:   External Document

## 2010-09-13 NOTE — Progress Notes (Signed)
Summary: Referral  Phone Note Call from Patient Call back at [phone]   Caller: Patient Call For: Nani Gasser MD Summary of Call: pt would like a referral to psych downstairs Initial call taken by: Kathlene November,  December 01, 2009 8:06 AM  Follow-up for Phone Call        OK. Will do.  Follow-up by: Nani Gasser MD,  December 01, 2009 8:07 AM

## 2010-09-13 NOTE — Consult Note (Signed)
Summary: [facility]  [facility]   Imported By: Lanelle Bal 09/16/2009 10:24:53  _____________________________________________________________________  External Attachment:    Type:   Image     Comment:   External Document

## 2010-09-13 NOTE — Progress Notes (Signed)
Summary: High B/P and having Tachycardia--review with Dr Tenny Craw  Phone Note Call from Patient Call back at [facility] Phone [phone] Call back at Work Phone [phone]   Caller: Patient Summary of Call: Pt having high B/P with Tachycardia Initial call taken by: Judie Grieve,  March 25, 2010 8:44 AM  Follow-up for Phone Call        talked with pt --pt had gone to ER with palpitations and just feeling bad-she doesn't feel like she can hold her head up--she has feeling bad for 2 weeks her B/P was elevated in the ER and she was given Amlodipine--pt feels since taking Amlodipine she has had more palpitations-pt took one Amlodipine yesterday-she asys her B/P has been in the 140/90 range and she doesn't really want to take anymore amlodiopine--I will review with Dr Sherran Needs, RN, BSN  March 25, 2010 9:09 AM --reviewed with Dr Sloan Leiter recommended pt probably not take Amlodipine---Dr Tenny Craw recommended pt see Dr Ladona Ridgel in followup and bring a log of her B/P and heart rate--pt states she usually does not tolerate beta blockers because of her asthma but she will use metoprolol prn--I talked with her about staying well hydrated and rested and avoiding caffeine     Appended Document: High B/P and having Tachycardia--review with Dr Tenny Craw appt with Dr Ladona Ridgel scheduled for 04/04/10

## 2010-09-13 NOTE — Letter (Signed)
Summary: [facility]  [facility]   Imported By: Lanelle Bal 12/08/2009 04:54:09  _____________________________________________________________________  External Attachment:    Type:   Image     Comment:   External Document

## 2010-09-13 NOTE — Progress Notes (Signed)
Summary: Needs a note for court  Phone Note Call from Patient   Caller: Patient Summary of Call: Pt has a court date for 06/22/10 and feels like she can not handle crowds to do to her anxiety and panic attacks and wants to know if you will write her a note? Call her and let her know at [phone] or [phone] Initial call taken by: Michaelle Copas,  May 23, 2010 4:19 PM  Follow-up for Phone Call        NOt written.  Follow-up by: Nani Gasser MD,  May 24, 2010 8:35 AM  Additional Follow-up for Phone Call Additional follow up Details #1::        This is not for jury duty she is going for a witness to testify and told only way to get out of it was to have a doctor note Additional Follow-up by: Kathlene November LPN,  May 24, 2010 8:48 AM    Additional Follow-up for Phone Call Additional follow up Details #2::    OK, printednote. Follow-up by: Nani Gasser MD,  May 24, 2010 10:32 AM

## 2010-09-13 NOTE — Progress Notes (Signed)
Summary: Review old MRI  Phone Note Call from Patient   Caller: Patient Summary of Call: Pt states she went to [name] ED yesterday for her leg pain. She states that they did CT scan and feel that it is sciatic nerve giving her pain. Pt would like to know if you will review an old MRI (she states you already have a copy of) and decide if she needs f/u MRI. Pt still feels this pain is coming from a disc. Please advise.  Initial call taken by: Payton Spark CMA,  October 26, 2009 11:37 AM  Follow-up for Phone Call        No doesn 't need f/u MRI.  Needs PT for the sciatica. We can refer if she would like.  Follow-up by: Nani Gasser MD,  October 26, 2009 12:50 PM  Additional Follow-up for Phone Call Additional follow up Details #1::        [name] informing Pt of the above

## 2010-09-13 NOTE — Letter (Signed)
Summary: Generic Letter  [facility]  [address]   [place], [place] [address]   Phone: [phone]  Fax: [phone]    05/24/2010  To Whom It May Concern, regarding:  Jennifer [name] [address] [place], [place]  [address]  [name] has a history of anxiety and doesn't do well in crowds and stressful situations. Please excuse her from testifying in court if at all possible. I don't think she would be of a capacity to perform well in such an environment. If you have any further questions please feel free to call our office.      Sincerely,   Nani Gasser MD

## 2010-09-13 NOTE — Progress Notes (Signed)
Summary: swollen tounge  Phone Note Call from Patient   Caller: Patient Summary of Call: Dr.Metheney   Call Back  [phone]  Patient was seen yesterday and she said her tounge is swollen, she is not sure if its the meds or not....want a call back. Initial call taken by: Vanessa Swaziland,  March 11, 2010 9:02 AM  Follow-up for Phone Call        Will have to stop the doxy. Not sure if true allergy or not. Take 2 Benadryl this AM. Call if swelling gets worse. If gets SOB go to the ED.  Follow-up by: Nani Gasser MD,  March 11, 2010 9:40 AM  Additional Follow-up for Phone Call Additional follow up Details #1::        left VM of MD instrutions on pt cell phone and instructed to call if any further questions. Additional Follow-up by: Kathlene November,  March 11, 2010 9:42 AM

## 2010-09-14 IMAGING — CT CT HEAD W/O CM
1 series · 16 of 30 positions shown, 20 images · non-contrast
Comparison: 11/30/2008

CLINICAL DATA: Headache.  Dizziness.

CT HEAD WITHOUT CONTRAST
TECHNIQUE: Contiguous axial images were obtained from the base of
the skull through the vertex without contrast

[Series 2: head 4.8 h37s · axial · 0.43mm/px · z∈[-140,+0]mm · 16 of 32 slices shown, 20 images]
[im 2/32  brain]
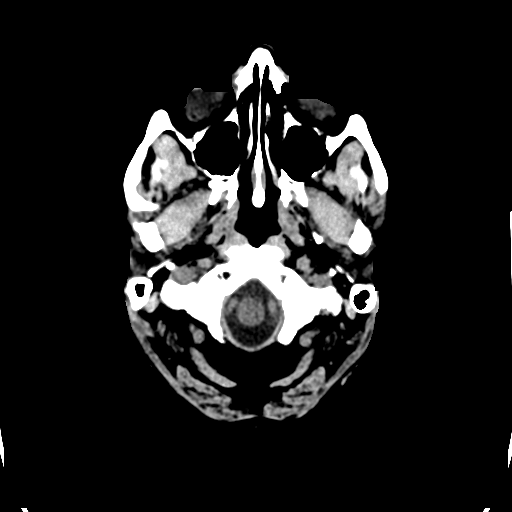
[im 2/32  bone]
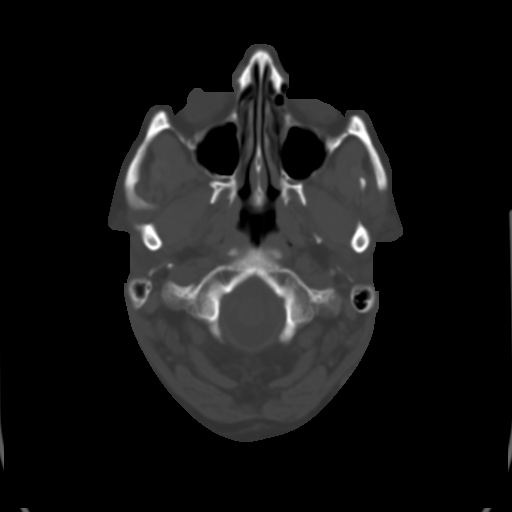
[im 4/32  brain]
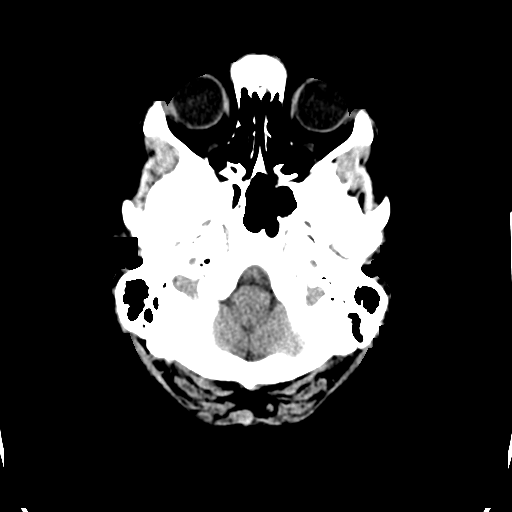
[im 6/32  brain]
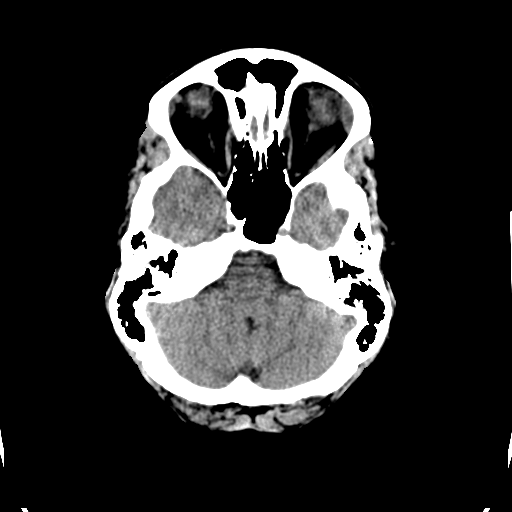
[im 8/32  brain]
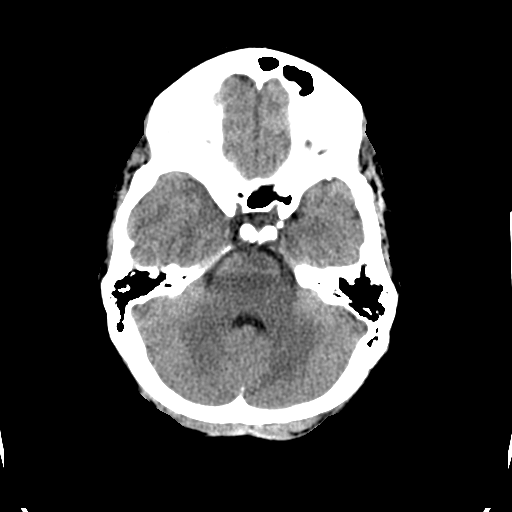
[im 9/32  brain]
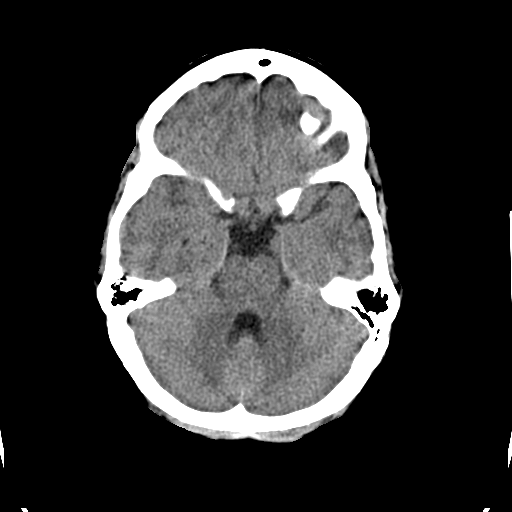
[im 9/32  bone]
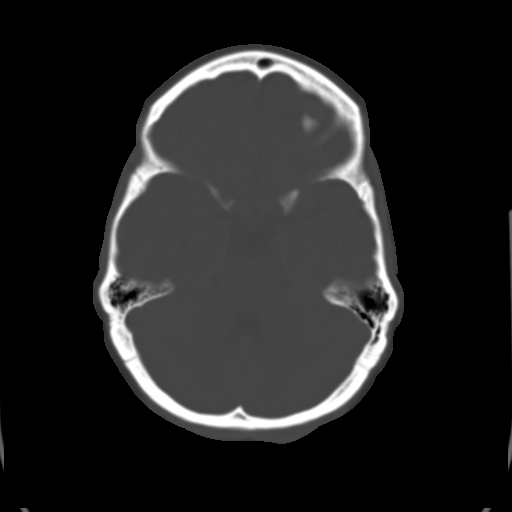
[im 11/32  brain]
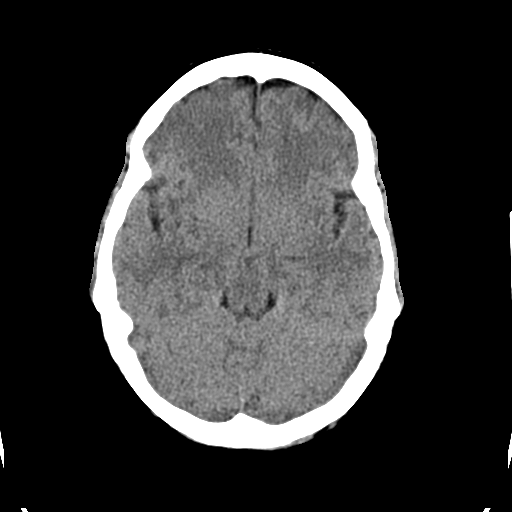
[im 13/32  brain]
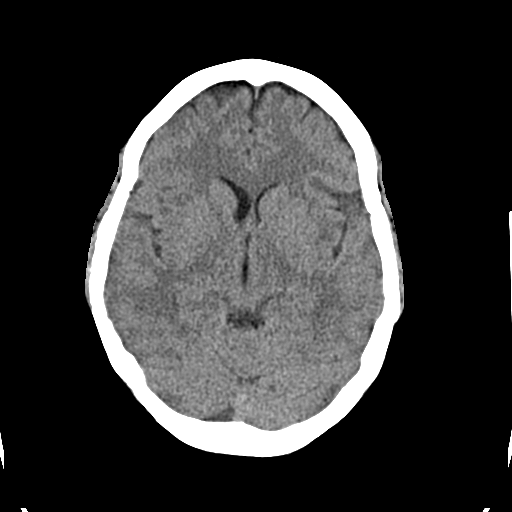
[im 15/32  brain]
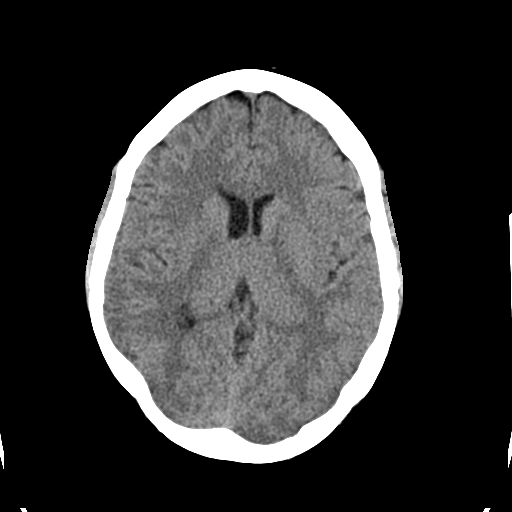
[im 17/32  brain]
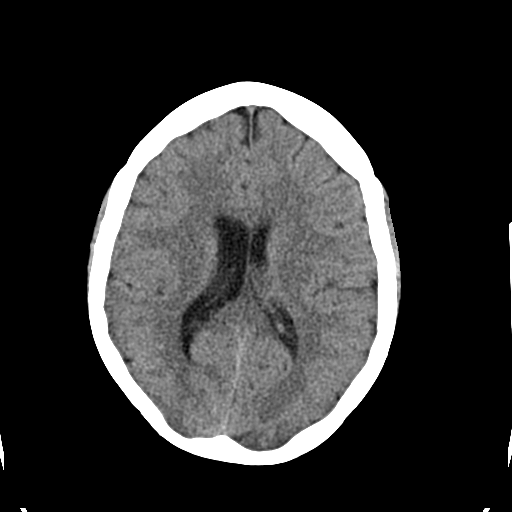
[im 17/32  bone]
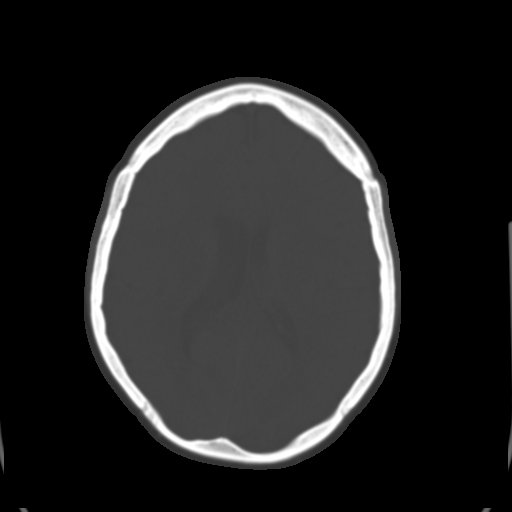
[im 19/32  brain]
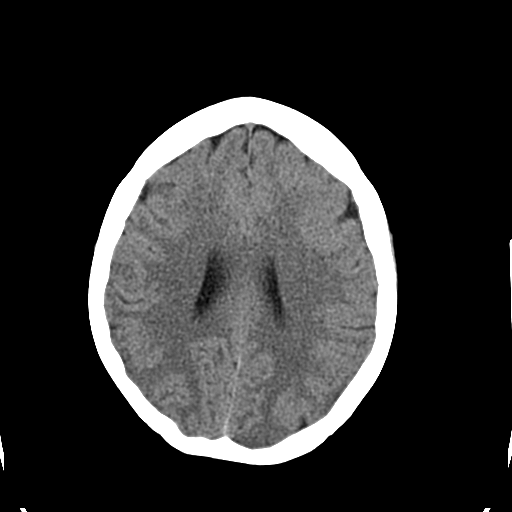
[im 21/32  brain]
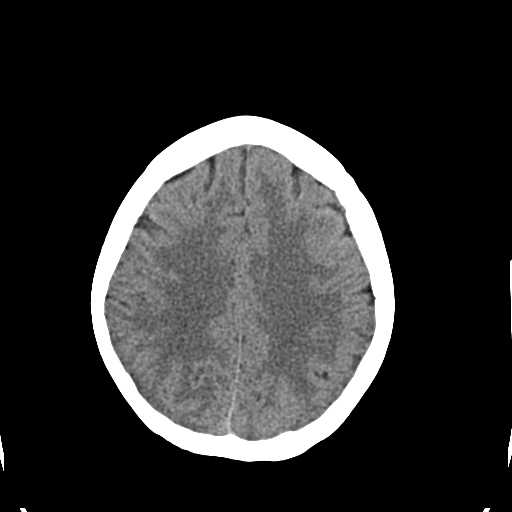
[im 23/32  brain]
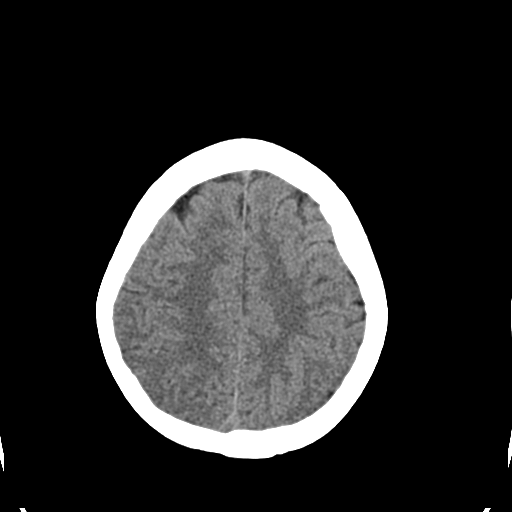
[im 24/32  brain]
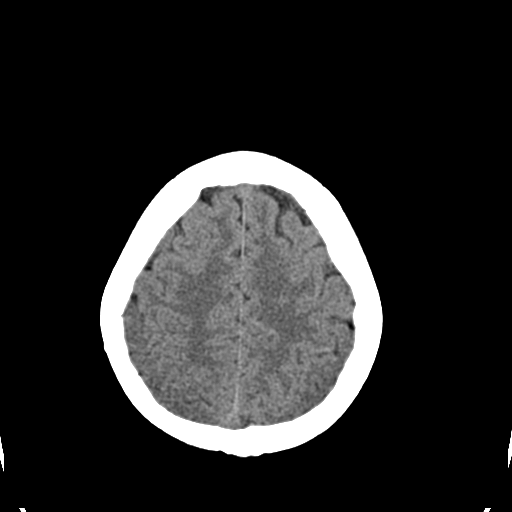
[im 24/32  bone]
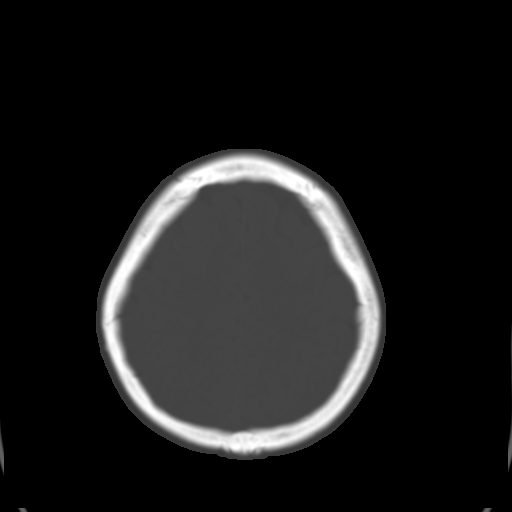
[im 26/32  brain]
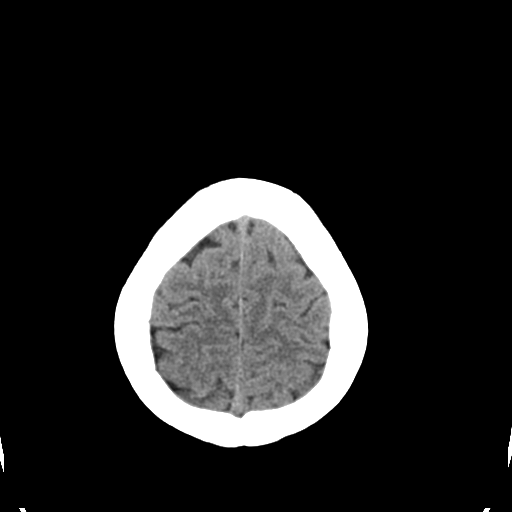
[im 28/32  brain]
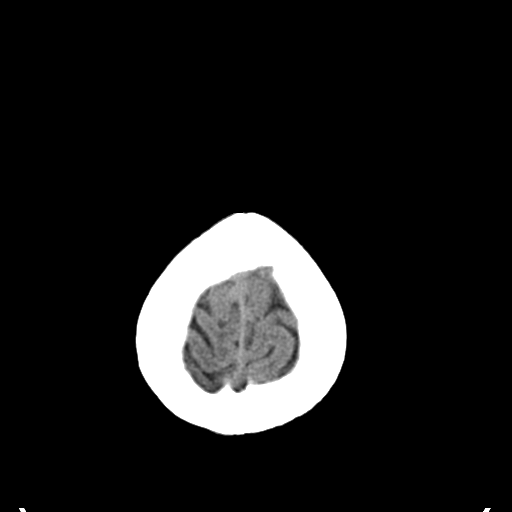
[im 30/32  brain]
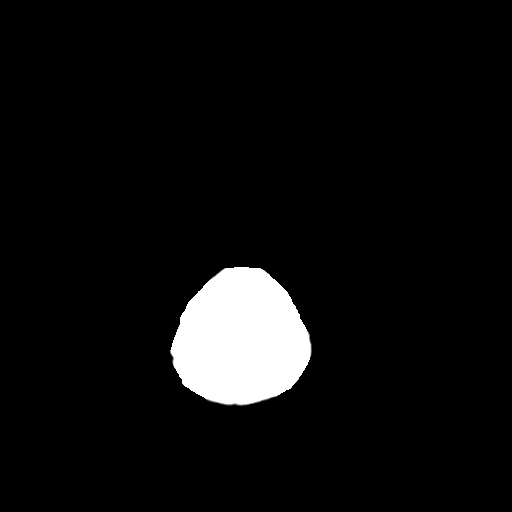

[16 of 30 positions shown; findings below may reference images not displayed]

FINDINGS: There is no evidence of intracranial hemorrhage, brain
edema, or other signs of acute infarction.  There is no evidence of
intracranial mass lesion or mass effect.  No abnormal extraaxial
fluid collections are identified.  There is no evidence of
hydrocephalus, or other significant intracranial abnormality.  No
skull abnormality identified.
IMPRESSION: Negative non-contrast head CT.

## 2010-09-15 NOTE — Progress Notes (Signed)
Summary: referral  Phone Note Call from Patient   Caller: Patient Call For: Nani Gasser MD Summary of Call: Pt called and states she wants a referral because she didnt make the appt that was scheduled for her back in July of last year.pt also wants a referral to [facility] to see Donnetta Simpers, MD Initial call taken by: Avon Gully CMA, Duncan Dull),  August 26, 2010 10:03 AM  Follow-up for Phone Call        We will make these but if she can't make her appt is is her responsibility to call their office to reschedule in a timely fashion as we will not make further referrals if she doesn't keep these appts.  Follow-up by: Nani Gasser MD,  August 26, 2010 1:20 PM  Additional Follow-up for Phone Call Additional follow up Details #1::        pt notified and was told per Dr.Metheney that we will not make referrals for her anymore for anything if she no showes these appt and it is her responsability to reschedule the appt if it conflicts with her schedule Additional Follow-up by: Avon Gully CMA, Duncan Dull),  August 26, 2010 2:54 PM

## 2010-09-15 NOTE — Letter (Signed)
Summary: Primary Care Consult Scheduled Letter  [facility] [place]  [address]   [place], [place] [address]   Phone: [phone]  Fax: [phone]      08/29/2010 MRN: [id number]  Jennifer [name] [address] [place], [place]  [address]    Dear Ms. Steines,  We have scheduled an appointment for you.  At the recommendation of Dr.Metheney, we have scheduled you a consult with a E.N.T.at [facility] in [place].-Dr. Imogene Burn on Wednesday 09-07-10 at 9:15.  Their address is [address] [place] [address]. The office phone number is [phone].  If this appointment day and time is not convenient for you, please feel free to call the office of the doctor you are being referred to at the number listed above and reschedule the appointment.     It is important for you to keep your scheduled appointments. We are here to make sure you are given good patient care.    Thank you, Michaelle Copas [phone] Patient Care Coordinator [facility] Kathryne Sharper

## 2010-09-15 NOTE — Miscellaneous (Signed)
Summary: [facility]  [facility]   Imported By: Lanelle Bal 08/02/2010 13:15:46  _____________________________________________________________________  External Attachment:    Type:   Image     Comment:   External Document

## 2010-09-15 NOTE — Progress Notes (Signed)
Summary: appointment  Phone Note Call from Patient Call back at Home Phone [phone]   Caller: Patient Call For: Dr. Delford Field  Summary of Call: Paitent phoned she at [facility] ER she is feeling horrible, having difficulty breathing on and off since 07/17/10 they did a chest xray stated that she has pneumonia they gave her 750 mg of Leviquin and a prescription to have filled. She is still there waitng on her chest xray to bring to her appointment. She wanted to be seen in [facility] I made her an appointment for Thursday 12/29 but she wants to know if you think that she needs to be sooner. She can be reached at [phone] if she doesn't answer it is because she has no service in the hospital and request that you give her a few minutes and try to call her again.  Initial call taken by: Vedia Coffer,  August 09, 2010 1:08 PM  Follow-up for Phone Call        Patient called back stated she spoke to her PCP and he told her to call back because he did not see in the note that her O2 dropped last night to 92 before she took her breathing test. Patients O2 was 98 at the ER today. Stated that her PCP told her to call us back to see if we think that she can wait until Thursday for an appointment. She states that she is still having problems breathing and her O2 is 95 now. Please call pt back at [phone] Follow-up by: Vedia Coffer,  August 09, 2010 2:28 PM  Additional Follow-up for Phone Call Additional follow up Details #1::        I spoke with the pt and she states she was seen at [facility] and they did a cxr and told her she had PNA and was given Levaquin. Pt states she feels worse this afternoon then she did this morning. She has an appt with PW on thursday. I offered pt an appt tomorrow AM with Dr. Vassie Loll but pt refused stating she only wants to see Dr. Delford Field. I advised that thursday was his first available. I advised pt that is she feesl worse and feels she needs to be evaluated now then to go  bck to er. Pt states she is now in [place], I advised she could go to ER here in [employer].  Pt states she will do so. Carron Curie CMA  August 09, 2010 3:31 PM
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
## 2010-09-15 NOTE — Progress Notes (Signed)
Summary: speak to nurse  Phone Note Other Incoming Call back at [phone]   Caller: michelle//[facility] [place] Summary of Call: Dr. Beaulah Dinning wants to know if PW will take over pt pulmonary issues including her asthma. Initial call taken by: Darletta Moll,  September 02, 2010 9:03 AM  Follow-up for Phone Call        I spoke to [facility] at Dr. Beaulah Dinning office and they are asking if Dr. Delford Field can take over pt asthma care and if he will take over refills on xopenex nebulizer? They state the pt cannot take albuterol because it causes tachycardia.  They state the pt is requesting this as well stating she only wants to see Dr. Delford Field from now on. I advised that Dr.w right already follows this pt for pulmonary. Marcelino Duster states that are aware of this but they have also been following her for asthma and want to have this taken over by Dr. Delford Field. They will continue to follow pt for allergies.  I advised I will forward message to PW. Marcelino Duster also requested last OV note be faxed.  Follow-up by: Carron Curie CMA,  September 02, 2010 9:44 AM  Additional Follow-up for Phone Call Additional follow up Details #1::        i am ok wth this Additional Follow-up by: Storm Frisk MD,  September 02, 2010 1:50 PM    Additional Follow-up for Phone Call Additional follow up Details #2::    Spoke with Marcelino Duster and notified of the above recs per PW.   Follow-up by: Vernie Murders,  September 02, 2010 2:27 PM

## 2010-09-15 NOTE — Assessment & Plan Note (Signed)
Summary: Sinusitis, atypical CP   Vital Signs:  Patient profile:   45 year old female Height:      62 inches Weight:      175 pounds Temp:     98.8 degrees F oral Pulse rate:   74 / minute BP sitting:   132 / 83  (right arm) Cuff size:   regular  Vitals Entered By: Avon Gully CMA, Duncan Dull) (July 25, 2010 11:43 AM) CC: sinus pressure   Primary Care Provider:  Nani Gasser MD  CC:  sinus pressure.  History of Present Illness: Does seen ENT in [place] Usually but can never seem to get in. Hx of cryptic tonils.  Feels some pressure and swelling below her eyes bilaterally. Feels better when gets in the shower.  Usually uses a sinus rinse but when tried to use it this AM it was painful adn felt alot of pressure.  Now have bloody discharge from the left sinuses.  Feels very congested.  No fever, but felt really cold yesterday.  Has felt more SOB last few days. Was in the hospital for an irregular EKG.  Still having some discomfort in her chest.  + pressure right ear. Seh brought in a copy of her allergy testing. They recommended adn antihistamine but she says she can't take these. Also they rec QVAR but she want to discuss this with Dr. Delford Field [facility]) first. Did review hospital notes.   Still having some some right side pain and felts a knot in her low back on the right. Has been told it is a lipoma but now says has pain radiating from that area around he lower ribs. Says she felt a something there along the rib edge that felt like a water cyst or blister that popped. Nothing otn the skin. She felt his was below the surface of the skin.   Current Medications (verified): 1)  Tylenol 500 Mg/65ml Liqd (Acetaminophen) .... As Needed 2)  Xopenex Hfa 45 Mcg/act Aero (Levalbuterol Tartrate) .... Every 6 Hours Prn  Pls Fill ,  This Pt Cannot Tolerate Albuterol Due To Tachycardia 3)  Lidoderm 5 % Ptch (Lidocaine) .... As Needed 4)  Straight Cane .... Dx: Vertigo, Sacroilitis 5)  Zofran  4 Mg Tabs (Ondansetron Hcl) .... Take 1 Tablet By Mouth Two Times A Day As Needed Nausea 6)  Patanase 0.6 % Soln (Olopatadine Hcl) .... As Needed 7)  Verapamil Hcl 40 Mg Tabs (Verapamil Hcl) .... Take 1 Tablet By Mouth Two Times A Day 8)  Metrogel-Vaginal 0.75 % Gel (Metronidazole) .Marland Kitchen.. 1 Gram Pv Nightly For 7 Days  Allergies (verified): 1)  ! * Iron Iv/po 2)  ! * Singulair 3)  ! Cipro 4)  ! Pcn 5)  ! Erythromycin 6)  ! Sulfa 7)  ! Rocephin 8)  ! Flagyl 9)  ! * Bystolic 10)  ! Beta Blockers 11)  ! * Antiemetics 12)  ! Reglan 13)  ! * Antiinflammatories 14)  ! Trifluoperazine Hcl (Trifluoperazine Hcl) 15)  ! Butorphanol Tartrate (Butorphanol Tartrate) 16)  ! Lisinopril 17)  ! Clonidine Hcl (Clonidine Hcl) 18)  ! * Oral Steroids 19)  ! Macrobid (Nitrofurantoin Monohyd Macro) 20)  Zoloft 21)  Effexor 22)  Prozac 23)  Paxil 24)  Adhesive Tape  Comments:  Nurse/Medical Assistant: The patient's medications and allergies were reviewed with the patient and were updated in the Medication and Allergy Lists. Avon Gully CMA, Duncan Dull) (July 25, 2010 11:46 AM)  Physical Exam  General:  Well-developed,well-nourished,in no acute distress; alert,appropriate and cooperative throughout examination Head:  Normocephalic and atraumatic without obvious abnormalities. No apparent alopecia or balding. Eyes:  No corneal or conjunctival inflammation noted. EOMI. Perrla.  Ears:  External ear exam shows no significant lesions or deformities.  Otoscopic examination reveals clear canals, tympanic membranes are intact bilaterally without bulging, retraction, inflammation or discharge. Hearing is grossly normal bilaterally. Nose:  External nasal examination shows no deformity or inflammation.  Mouth:  Oral mucosa and oropharynx without lesions or exudates.  Teeth in good repair. Neck:  No deformities, masses, or tenderness noted. Lungs:  Normal respiratory effort, chest expands symmetrically.  Lungs are clear to auscultation, no crackles or wheezes. Heart:  Normal rate and regular rhythm. S1 and S2 normal without gallop, murmur, click, rub or other extra sounds. Abdomen:  Bowel sounds positive,abdomen soft and non-tender without masses, organomegaly or hernias noted. Skin:  no rashes.  I do palpate a very small lipoma on thr right low back near the edge of the ribs.  She is very tender to touch.  Cervical Nodes:  No lymphadenopathy noted   Impression & Recommendations:  Problem # 1:  SINUSITIS - ACUTE-NOS (ICD-461.9) She only tolerates levaquin 250mg  so will tx with this. Call if not better in one week.  Her updated medication list for this problem includes:    Patanase 0.6 % Soln (Olopatadine hcl) .Marland Kitchen... As needed    Levaquin 250 Mg Tabs (Levofloxacin) .Marland Kitchen... Take 1 tablet by mouth once a day for 5 days  Instructed on treatment. Call if symptoms persist or worsen.   Problem # 2:  CHEST PAIN, ATYPICAL (ICD-786.59)  I explained that her EKG is unlikey to have changed since her hospitalization at [facility].   Did do an EKG to reaasure her.  Rate 93 bpm, NSR.  We let her know this was normal and that we will fax a copy to [place] Cardiologist [place] who she needs to f/u with.    Orders: EKG w/ Interpretation (93000)  Complete Medication List: 1)  Tylenol 500 Mg/29ml Liqd (Acetaminophen) .... As needed 2)  Xopenex Hfa 45 Mcg/act Aero (Levalbuterol tartrate) .... Every 6 hours prn  pls fill ,  this pt cannot tolerate albuterol due to tachycardia 3)  Lidoderm 5 % Ptch (Lidocaine) .... As needed 4)  Straight Cane  .... Dx: vertigo, sacroilitis 5)  Zofran 4 Mg Tabs (Ondansetron hcl) .... Take 1 tablet by mouth two times a day as needed nausea 6)  Patanase 0.6 % Soln (Olopatadine hcl) .... As needed 7)  Verapamil Hcl 40 Mg Tabs (Verapamil hcl) .... Take 1 tablet by mouth two times a day 8)  Metrogel-vaginal 0.75 % Gel (Metronidazole) .Marland Kitchen.. 1 gram pv nightly for 7 days 9)  Levaquin 250 Mg  Tabs (Levofloxacin) .... Take 1 tablet by mouth once a day for 5 days  Patient Instructions: 1)  Call if not better in one week.  Prescriptions: LEVAQUIN 250 MG TABS (LEVOFLOXACIN) Take 1 tablet by mouth once a day for 5 days  #5 x 0   Entered and Authorized by:   Nani Gasser MD   Signed by:   Nani Gasser MD on 07/25/2010   Method used:   Electronically to        [facility] # [id number]* (retail)       [address]       [address]       [place], [place]  [address]  Ph: [phone]       Fax: [phone]   RxID:   [id number]    Orders Added: 1)  Est. Patient Level IV [69678] 2)  EKG w/ Interpretation [93000]

## 2010-09-19 ENCOUNTER — Emergency Department (HOSPITAL_BASED_OUTPATIENT_CLINIC_OR_DEPARTMENT_OTHER): Payer: Medicare Other

## 2010-09-19 ENCOUNTER — Emergency Department (HOSPITAL_BASED_OUTPATIENT_CLINIC_OR_DEPARTMENT_OTHER)
Admission: EM | Admit: 2010-09-19 | Discharge: 2010-09-19 | Disposition: A | Discharge status: Home / Self Care | Payer: Medicare Other | Attending: Emergency Medicine | Admitting: Emergency Medicine

## 2010-09-19 DIAGNOSIS — W19XXXA Unspecified fall, initial encounter: Secondary | ICD-10-CM | POA: Insufficient documentation

## 2010-09-19 DIAGNOSIS — S1093XA Contusion of unspecified part of neck, initial encounter: Secondary | ICD-10-CM

## 2010-09-19 DIAGNOSIS — S0003XA Contusion of scalp, initial encounter: Secondary | ICD-10-CM

## 2010-09-19 DIAGNOSIS — I10 Essential (primary) hypertension: Secondary | ICD-10-CM | POA: Insufficient documentation

## 2010-09-19 DIAGNOSIS — R51 Headache: Secondary | ICD-10-CM

## 2010-09-19 DIAGNOSIS — G8929 Other chronic pain: Secondary | ICD-10-CM | POA: Insufficient documentation

## 2010-09-19 DIAGNOSIS — S0083XA Contusion of other part of head, initial encounter: Secondary | ICD-10-CM

## 2010-09-19 DIAGNOSIS — Y92009 Unspecified place in unspecified non-institutional (private) residence as the place of occurrence of the external cause: Secondary | ICD-10-CM | POA: Insufficient documentation

## 2010-09-22 ENCOUNTER — Other Ambulatory Visit: Payer: Self-pay | Admitting: Critical Care Medicine

## 2010-09-22 ENCOUNTER — Telehealth: Payer: Self-pay | Admitting: Family Medicine

## 2010-09-22 ENCOUNTER — Ambulatory Visit (HOSPITAL_BASED_OUTPATIENT_CLINIC_OR_DEPARTMENT_OTHER)
Admission: RE | Admit: 2010-09-22 | Discharge: 2010-09-22 | Disposition: A | Discharge status: Home / Self Care | Payer: Medicare Other | Source: Ambulatory Visit | Attending: Critical Care Medicine | Admitting: Critical Care Medicine

## 2010-09-22 ENCOUNTER — Ambulatory Visit (INDEPENDENT_AMBULATORY_CARE_PROVIDER_SITE_OTHER): Payer: Medicare Other | Admitting: Critical Care Medicine

## 2010-09-22 ENCOUNTER — Encounter: Payer: Self-pay | Admitting: Critical Care Medicine

## 2010-09-22 DIAGNOSIS — R52 Pain, unspecified: Secondary | ICD-10-CM

## 2010-09-22 DIAGNOSIS — R079 Chest pain, unspecified: Secondary | ICD-10-CM | POA: Insufficient documentation

## 2010-09-22 DIAGNOSIS — J209 Acute bronchitis, unspecified: Secondary | ICD-10-CM

## 2010-09-22 DIAGNOSIS — R0602 Shortness of breath: Secondary | ICD-10-CM | POA: Insufficient documentation

## 2010-09-24 IMAGING — CR DG HIP W/ PELVIS BILAT
3 series · 3 of 3 positions shown · non-contrast
Comparison: 03/30/2009

CLINICAL DATA: Bilateral hip pain.

BILATERAL HIP WITH PELVIS - 4+ VIEW

[view not recorded (1 of 3)]
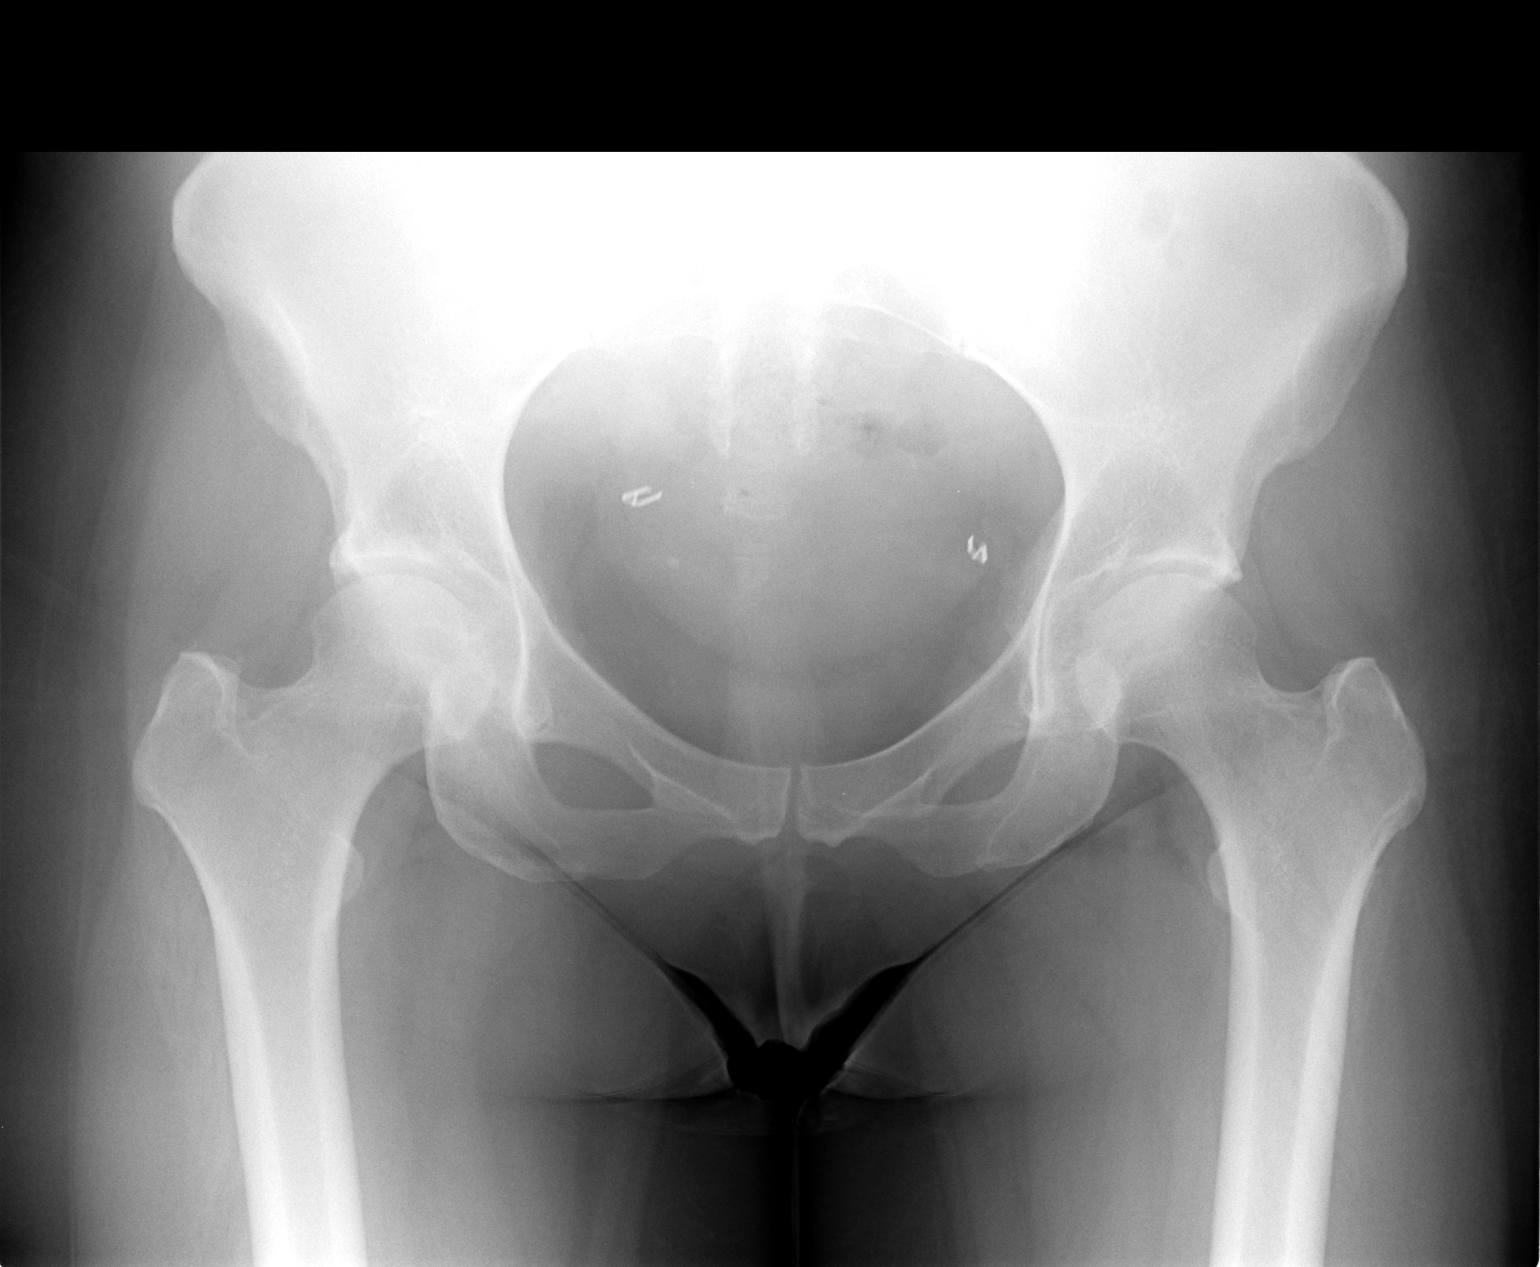

[view not recorded (2 of 3)]
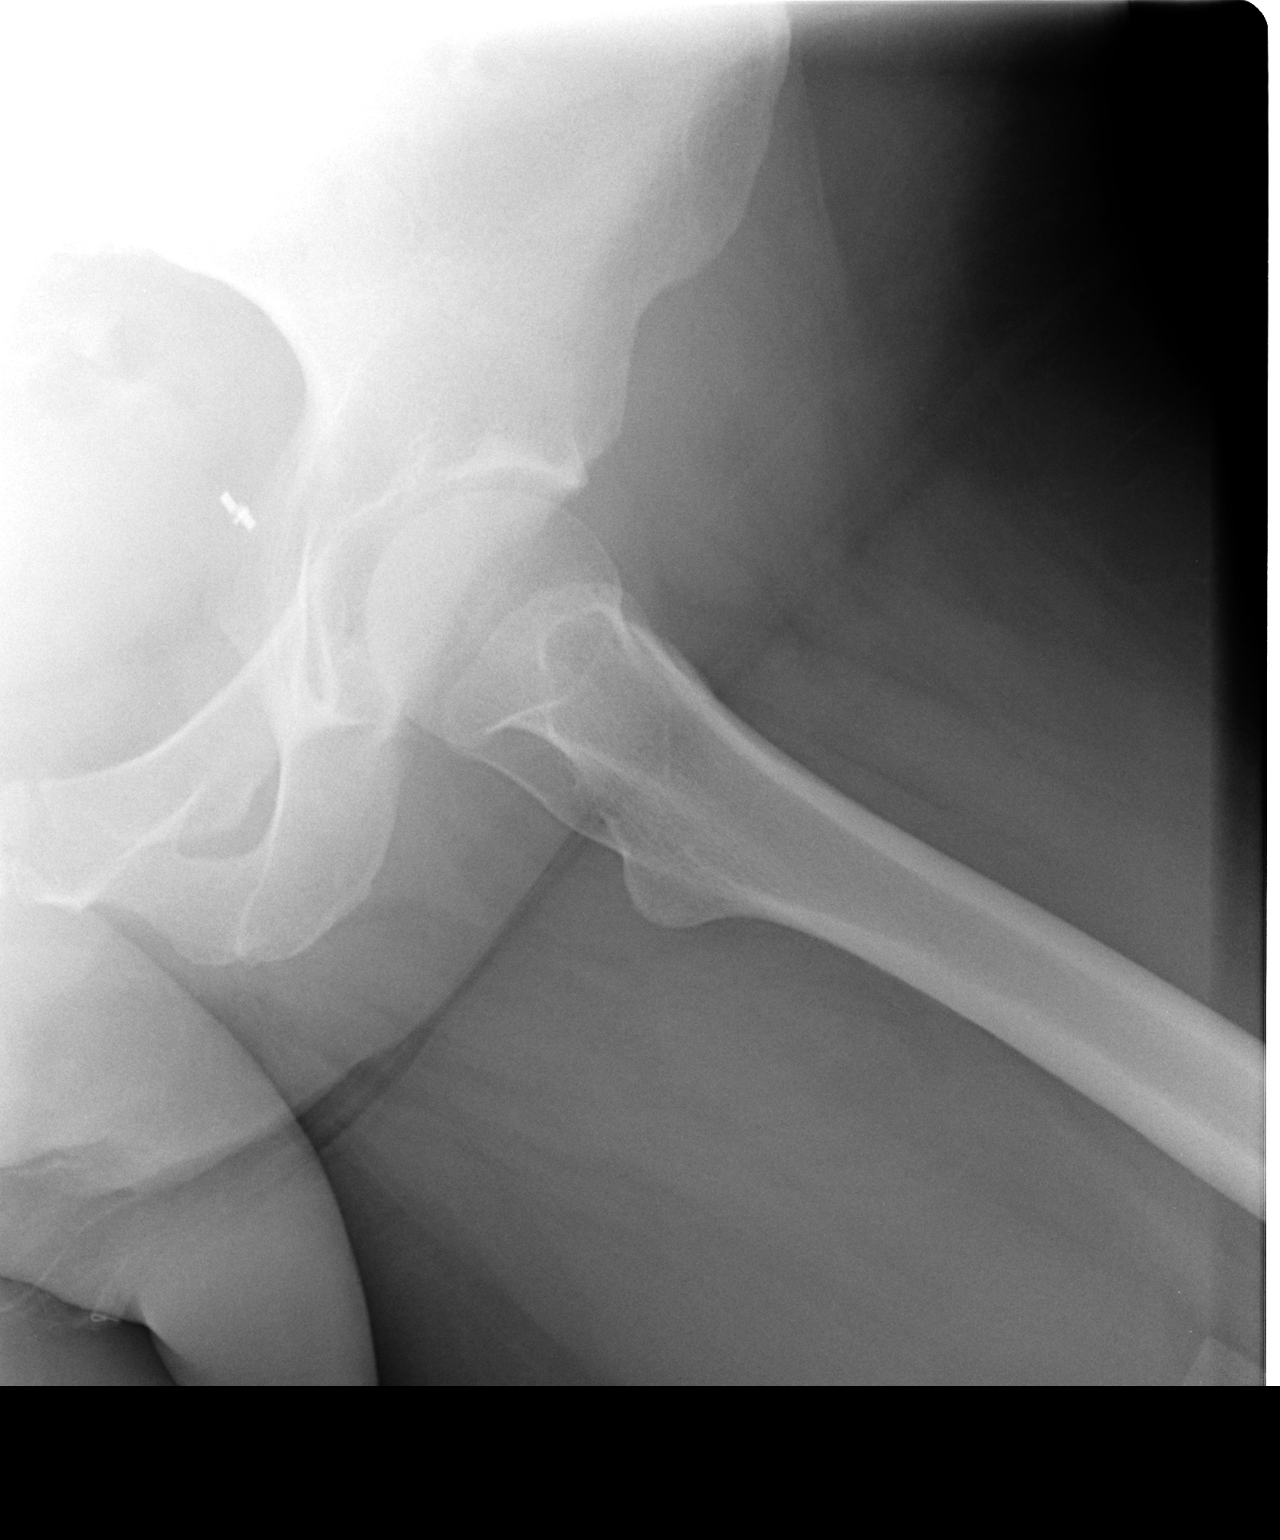

[view not recorded (3 of 3)]
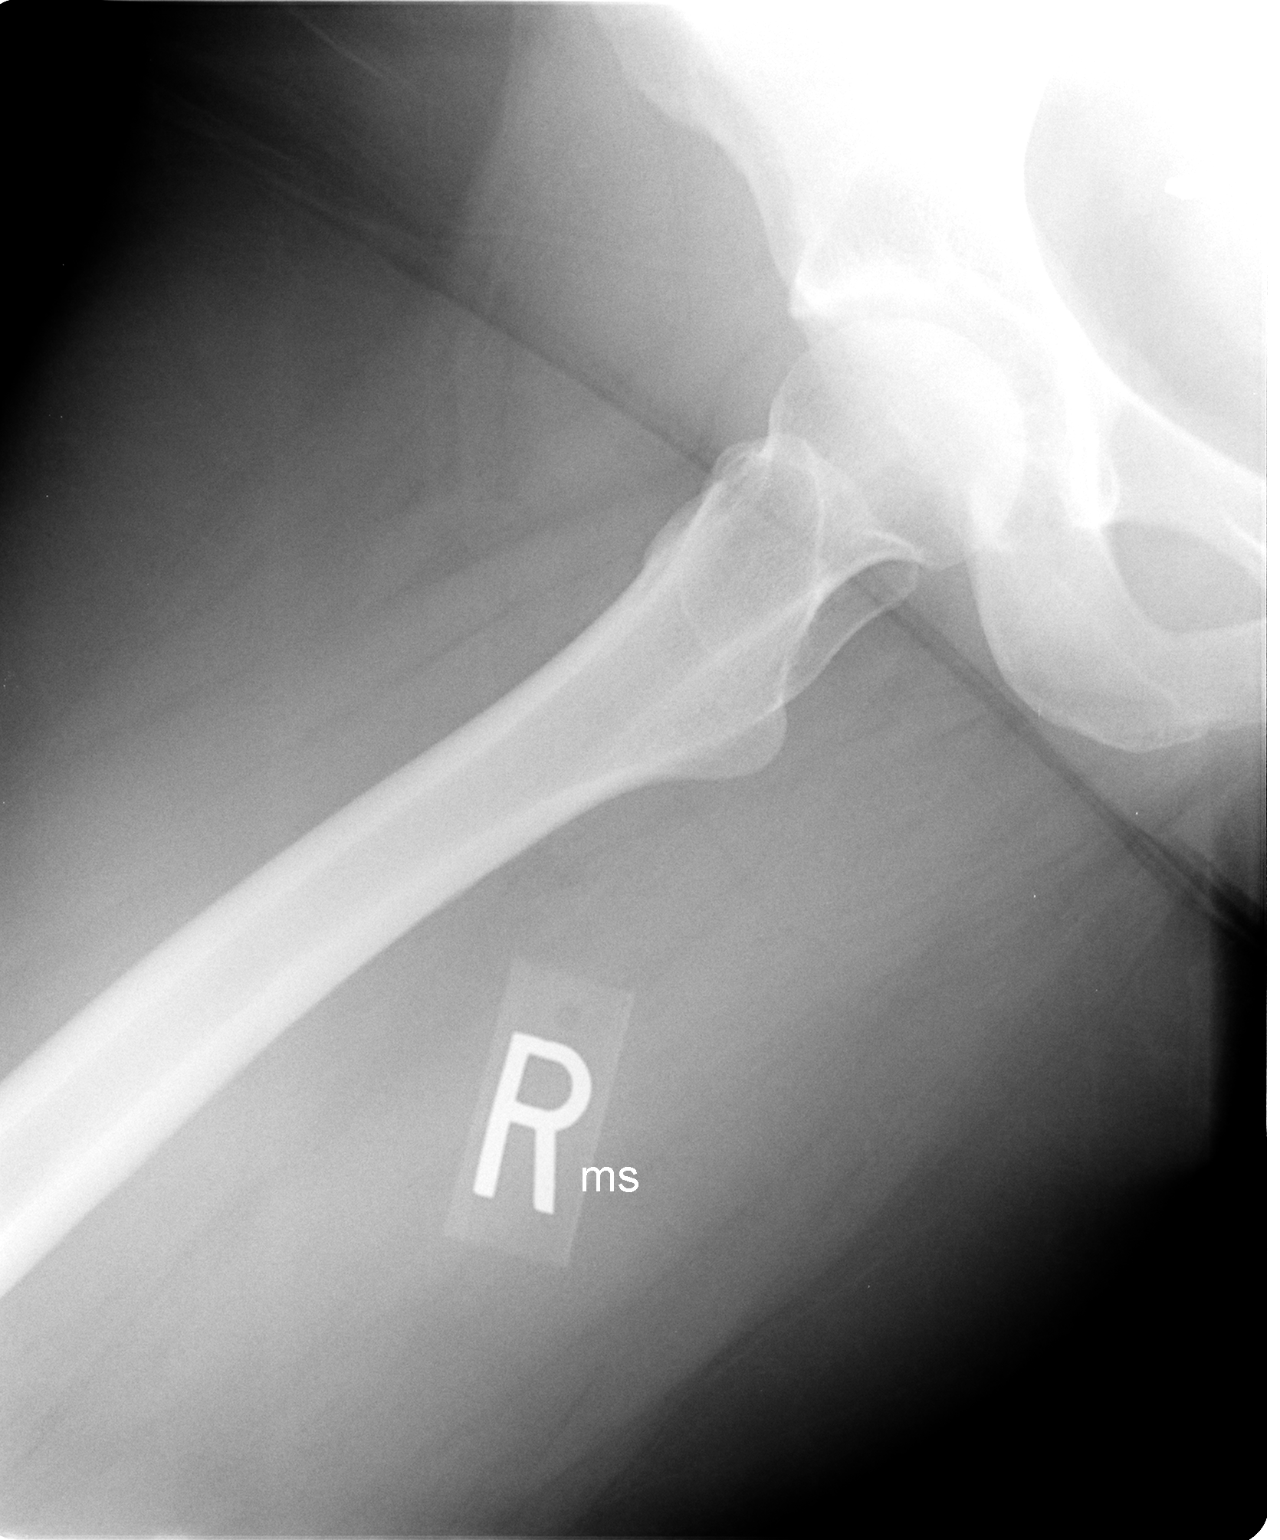

[3 of 3 positions shown; findings below may reference images not displayed]

FINDINGS: Both hips are normally located.  No significant
degenerative changes.  No acute bony findings.  No plain film
evidence for avascular necrosis.  Pubic symphysis and SI joints are
intact.  The sacrum appears normal.
IMPRESSION: No acute bony findings or significant degenerative changes.  No
change since prior study.

## 2010-09-27 LAB — HM PAP SMEAR: HM Pap smear: NORMAL

## 2010-09-28 ENCOUNTER — Encounter: Payer: Self-pay | Admitting: Family Medicine

## 2010-09-29 NOTE — Consult Note (Signed)
Summary: [facility]  [facility]   Imported By: Maryln Gottron 09/22/2010 11:11:03  _____________________________________________________________________  External Attachment:    Type:   Image     Comment:   External Document

## 2010-09-29 NOTE — Letter (Signed)
Summary: [facility]   Imported By: Maryln Gottron 09/22/2010 11:12:42  _____________________________________________________________________  External Attachment:    Type:   Image     Comment:   External Document

## 2010-09-29 NOTE — Progress Notes (Signed)
Summary: Pain in ribcage question  Phone Note Call from Patient Call back at [phone]   Reason for Call: Talk to Nurse Summary of Call: Pt is having pain in ribcage on right side x 1 month, has had her heart checked by another doctor which was okay, feels like pneumonia Initial call taken by: Lannette Donath,  September 22, 2010 11:04 AM  Follow-up for Phone Call        as noted in the chart pt had a chest xray and saw Dr. and had a chest xray Follow-up by: Avon Gully CMA, Duncan Dull),  September 23, 2010 9:42 AM

## 2010-09-30 ENCOUNTER — Telehealth (INDEPENDENT_AMBULATORY_CARE_PROVIDER_SITE_OTHER): Payer: Self-pay | Admitting: *Deleted

## 2010-09-30 ENCOUNTER — Encounter: Payer: Self-pay | Admitting: Family Medicine

## 2010-10-03 ENCOUNTER — Telehealth: Payer: Self-pay | Admitting: Internal Medicine

## 2010-10-05 ENCOUNTER — Other Ambulatory Visit: Payer: Self-pay

## 2010-10-05 ENCOUNTER — Encounter: Payer: Self-pay | Admitting: Family Medicine

## 2010-10-05 NOTE — Progress Notes (Signed)
Summary: returning a call - xopenex not covered  Phone Note Call from Patient Call back at Home Phone [phone]   Caller: Patient Call For: WRIGHT Summary of Call: Patient phoned stated that Lori left her a message a few days ago to call her back and ask for her or for triage. she thinks that it has something to do with her medicines. If the triage nurse doesnt know what is going on there will be no need to call her back. She can be reached at [phone] Initial call taken by: Vedia Coffer,  September 30, 2010 3:12 PM  Follow-up for Phone Call        Called, spoke with pt.  She was informed per previous phone message, xopenex not covered by Medicare -- she will have to pay for med out of pocket. Advised pt she can call office to see if we get any samples in but right now we are out.  She verbalized understanding of this.  Follow-up by: Gweneth Dimitri RN,  September 30, 2010 4:39 PM

## 2010-10-05 NOTE — Progress Notes (Signed)
Summary: Xopenex HFA or Neb Sol---NOT COVERED  Phone Note Other Incoming   Summary of Call: Received prior auth form from [place] at [facility] for xopenex HFA. Pls see phone note from 09/02/10 for additional info.  Form completed and signed by PW and faxed back to [phone].  Form placed in triage with the awaiting approval/denial forms. Initial call taken by: Gweneth Dimitri RN,  September 07, 2010 2:25 PM  Follow-up for Phone Call        called Rosann Auerbach to check the status of PA for xopenex hfa.  per [employer] rep, xopenex hfa is covered w/o PA but pt will have to pay 2nd level copay/co-insurance so price will be higher per month, however rep also states that  pt has low-income financial plan so her copay will not be as high.  per [employer] rep, monthly cost of xopenex hfa will $6.50.  called [facility] to see if pt's xopenex will "go through."  [facility], rx did not go through stating that rx is not covered under medicar part D, but will be covered under part B.  not part B card on file for pt.  called spoke with patient to see if she has a medicar part B card for her xopenex hfa.  per pt, she does not need a xopenex hfa but her xopenex in her nebulizer.  PA done for wrong medication.  per cover sheet on PA, PA was for xopenex 1.25mg . Boone Master CNA/MA  September 16, 2010 3:29 PM   Additional Follow-up for Phone Call Additional follow up Details #1::        called [employer] again, spoke with [facility] who states that whether it is her xopenex hfa or a xopenex neb solution it will only be covered under part B.  still no PA needed.  needs to be filed under medicare part b.  called spoke with patient, advised her that i spoke with [place] again and what i was told.  pt began getting aggitated stating that she has gone through this before and stated that she does not understand why they need her medicare part b card as it does not contain any different information than her part d card.  i  informed pt that i cannot give the pharmacy the information off her card for her, that she will need to take it to the pharmacy herself.  pt then stated that she will do this but when she runs across trouble like she did "last time" she is going to call up here and ask for me by name to tell me this so that it can be taken care of.  [facility], was on hold x72mins.  LM for pharmacist TCB before he leaves at 4pm. Boone Master CNA/MA  September 16, 2010 3:53 PM   Lorin Picket, pharmacist returned my call.  per Lorin Picket, they have indeed filed her xopenex under part b in the past, but it has been rejected.  i informed him that i spoke with [place] twice and was told the same thing both times.  unsure where we go from here. Boone Master CNA/MA  September 16, 2010 4:22 PM     Additional Follow-up for Phone Call Additional follow up Details #2::    Patient called again about xopenex for her home nebulizer.  Has been trying since December to get prescription approved.  Pharmacy is [facility] in [place], [phone], and her insurance is Medicare B and [employer] Rx  prescription plan ([id number]  ID# [id number]).  Call patient at [phone] or [phone].   Follow-up by: Leonette Monarch,  September 21, 2010 10:37 AM  Additional Follow-up for Phone Call Additional follow up Details #3:: Details for Additional Follow-up Action Taken: I spoke with [facility] who stated and faxed original RX (given by Dr. Jean Rosenthal at [facility]). I have spoke with TD about this and will be addressed today. [facility] stated that we needed to call their Corporate Office who handles Medicare and give DX code-the one they have on file will not go through.Reynaldo Minium CMA  September 21, 2010 2:28 PM    Pt called to check on status of PA-stated she is having right sided rib pain and feels like its pnuemonia again-I have put her on PW HP schedule today at 430 and spoke with Crystal to have PW write new RX with DX code on the Rx for pt  to take to [facility].Reynaldo Minium CMA  September 22, 2010 2:36 PM    Pt in office today.  Prior Auth was attempted on HFA bc no xopenex neb on pt's med current med list.  Pt states she was given this by another doctor in the past and it seems to work better than hfa.  States she is currently out of the xopenex neb and requesting PW to write order for this.  Xopenex neb rx sent to [facility] with dx code by PW.  [employer], spoke with Chad. Per Chad, med still did not go thru even with dx code on rx.  She will fax over a new PA form to [place] office. Gweneth Dimitri RN  September 22, 2010 4:53 PM  Per Rosann Auerbach, Xopenex Neb Solution needs to be filed under [facility] Part B. Pharmacist at [facility] on [address] in [facility] is aware. Pharmacist tried to run this through for the patient and it came back that the patient is not eligible. Per [facility] (appeals department), this patient has been eligible for [facility] Part B since 09/15/2003 but Xopenex, whether HFA or Neb Sol, is a non-covered medication and the patient will have to pay out of pocket if she wishes to continue on this medication. I will forward to Dr. Delford Field and Aggie Cosier so they are aware. I have left a msg for the patient to call back so she can be informed. Michel Bickers [facility]  September 28, 2010 11:36 AM    Nothing I can do,  I am not an insurance company,  we have done all we can for this pt  Appended Document: Xopenex HFA or Neb Sol---NOT COVERED Called pt's home number - [id number] to inform her of above.  Appended Document: Xopenex HFA or Neb Sol---NOT COVERED Pt returned call.  She was informed xopenex not covered by medicare - she will need to pay for med out of pocket.  Advised no samples available now but she can call office back to see if we get some in.  She verbalized understanding.
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
## 2010-10-11 ENCOUNTER — Encounter: Payer: Self-pay | Admitting: Family Medicine

## 2010-10-11 ENCOUNTER — Ambulatory Visit (INDEPENDENT_AMBULATORY_CARE_PROVIDER_SITE_OTHER): Payer: Medicare Other | Admitting: Family Medicine

## 2010-10-11 DIAGNOSIS — J029 Acute pharyngitis, unspecified: Secondary | ICD-10-CM

## 2010-10-11 DIAGNOSIS — R220 Localized swelling, mass and lump, head: Secondary | ICD-10-CM

## 2010-10-11 LAB — CONVERTED CEMR LAB
HCT: 40.5 % (ref 36.0–46.0)
Hemoglobin: 13.7 g/dL (ref 12.0–15.0)
Lymphocytes Relative: 28 % (ref 12–46)
Lymphs Abs: 1.2 10*3/uL (ref 0.7–4.0)
MCHC: 33.9 g/dL (ref 30.0–36.0)
MCV: 90.1 fL (ref 78.0–100.0)
Monocytes Absolute: 0.4 10*3/uL (ref 0.1–1.0)
Monocytes Relative: 10 % (ref 3–12)
Neutro Abs: 2.7 10*3/uL (ref 1.7–7.7)
Neutrophils Relative %: 62 % (ref 43–77)
Platelets: 172 10*3/uL (ref 150–400)
RBC: 4.49 M/uL (ref 3.87–5.11)
RDW: 14.6 % (ref 11.5–15.5)
WBC: 4.4 10*3/uL (ref 4.0–10.5)

## 2010-10-11 NOTE — Progress Notes (Signed)
Summary: pt calling re lab work  Phone Note Call from Patient   Caller: Patient [phone] Reason for Call: Talk to Nurse Summary of Call: pt lives in [place] and has lab work here-wants to know if she can have it done somewhere in [place]? Initial call taken by: Glynda Jaeger,  October 03, 2010 10:32 AM  Follow-up for Phone Call        Jennifer [name] called and was getting very upset in regards to never being called about her 6 month follow up.  She says that her labs can be done in [facility] and she needs a follow up 6 months as well.   Will see her 3 /8/12 at 11:40am.  labs drawn in [facility]  Order mailed to patient Dennis Bast, RN, BSN  October 03, 2010 12:53 PM     Appended Document: pt calling re lab work Left pt message with date and time of appointment and asked that she call the ofice back at [phone] and confirm with [facility] Mailing lab order

## 2010-10-12 ENCOUNTER — Encounter: Payer: Self-pay | Admitting: Family Medicine

## 2010-10-12 ENCOUNTER — Emergency Department (HOSPITAL_COMMUNITY)
Admission: EM | Admit: 2010-10-12 | Discharge: 2010-10-12 | Disposition: A | Discharge status: Home / Self Care | Payer: Medicare Other | Attending: Emergency Medicine | Admitting: Emergency Medicine

## 2010-10-12 DIAGNOSIS — R079 Chest pain, unspecified: Secondary | ICD-10-CM | POA: Insufficient documentation

## 2010-10-12 DIAGNOSIS — Z79899 Other long term (current) drug therapy: Secondary | ICD-10-CM | POA: Insufficient documentation

## 2010-10-12 DIAGNOSIS — M79609 Pain in unspecified limb: Secondary | ICD-10-CM | POA: Insufficient documentation

## 2010-10-12 DIAGNOSIS — I10 Essential (primary) hypertension: Secondary | ICD-10-CM | POA: Insufficient documentation

## 2010-10-12 DIAGNOSIS — R0609 Other forms of dyspnea: Secondary | ICD-10-CM | POA: Insufficient documentation

## 2010-10-12 DIAGNOSIS — R5381 Other malaise: Secondary | ICD-10-CM | POA: Insufficient documentation

## 2010-10-12 DIAGNOSIS — R0989 Other specified symptoms and signs involving the circulatory and respiratory systems: Secondary | ICD-10-CM | POA: Insufficient documentation

## 2010-10-12 LAB — POCT I-STAT, CHEM 8
BUN: 10 mg/dL (ref 6–23)
Calcium, Ion: 1.17 mmol/L (ref 1.12–1.32)
Chloride: 107 mEq/L (ref 96–112)
Creatinine, Ser: 0.8 mg/dL (ref 0.4–1.2)
Glucose, Bld: 89 mg/dL (ref 70–99)
HCT: 40 % (ref 36.0–46.0)
Hemoglobin: 13.6 g/dL (ref 12.0–15.0)
Potassium: 3.5 mEq/L (ref 3.5–5.1)
Sodium: 141 mEq/L (ref 135–145)
TCO2: 22 mmol/L (ref 0–100)

## 2010-10-12 LAB — CBC
HCT: 39 % (ref 36.0–46.0)
Hemoglobin: 13.4 g/dL (ref 12.0–15.0)
MCH: 30.6 pg (ref 26.0–34.0)
MCHC: 34.4 g/dL (ref 30.0–36.0)
MCV: 89 fL (ref 78.0–100.0)
Platelets: 197 10*3/uL (ref 150–400)
RBC: 4.38 MIL/uL (ref 3.87–5.11)
RDW: 14 % (ref 11.5–15.5)
WBC: 7.3 10*3/uL (ref 4.0–10.5)

## 2010-10-12 LAB — CONVERTED CEMR LAB: TSH: 1.658 microintl units/mL (ref 0.350–4.500)

## 2010-10-12 LAB — POCT CARDIAC MARKERS
CKMB, poc: <1 ng/mL — ABNORMAL LOW (ref 1.0–8.0)
Myoglobin, poc: 41.3 ng/mL (ref 12–200)
Troponin i, poc: <0.05 ng/mL (ref 0.00–0.09)

## 2010-10-14 ENCOUNTER — Encounter: Payer: Self-pay | Admitting: Family Medicine

## 2010-10-17 ENCOUNTER — Ambulatory Visit (HOSPITAL_BASED_OUTPATIENT_CLINIC_OR_DEPARTMENT_OTHER)
Admission: RE | Admit: 2010-10-17 | Discharge: 2010-10-17 | Disposition: A | Discharge status: Home / Self Care | Payer: Medicare Other | Source: Ambulatory Visit | Attending: Otolaryngology | Admitting: Otolaryngology

## 2010-10-17 ENCOUNTER — Other Ambulatory Visit (HOSPITAL_BASED_OUTPATIENT_CLINIC_OR_DEPARTMENT_OTHER): Payer: Self-pay | Admitting: Otolaryngology

## 2010-10-17 DIAGNOSIS — R070 Pain in throat: Secondary | ICD-10-CM

## 2010-10-17 MED ORDER — IOHEXOL 300 MG/ML  SOLN: 100.0000 mL | Freq: Once | INTRAMUSCULAR | Status: AC | PRN

## 2010-10-20 ENCOUNTER — Ambulatory Visit: Payer: Medicare Other | Admitting: Internal Medicine

## 2010-10-20 NOTE — Assessment & Plan Note (Signed)
Summary: throat feels swollen.    Vital Signs:  Patient profile:   45 year old female Height:      62 inches Weight:      176 pounds Temp:     98.7 degrees F oral Pulse rate:   71 / minute BP sitting:   144 / 96  (right arm) Cuff size:   regular  Vitals Entered By: Avon Gully CMA, Duncan Dull) (October 11, 2010 11:18 AM) CC: lumph nodes in throat feel swollen   Primary Care Provider:  Nani Gasser MD  CC:  lumph nodes in throat feel swollen.  History of Present Illness: Had vaginal irritation and had bx. Dx with pagets. Has f/u with gyn-onc in  a couple  of weeks.   Had an esophageal dilation a couple of weeks ago. has had enlarged cervical LN this week. Also painful under the chin. Occ painful to swallow.  Having alot of drainage that is collecting into the throat.  Says will often time 2 fudgecicles to purposely make her self reflux to she can help get the "gook" out.  I did review her ED notes and report for esoph dilatation. She is swallowing OK today. Some odynophagia with even swallowing saliva.  NO vomiting. No abdominal pain.  Says still occ has problems swallowing liquids but this has been better since her dilatation. She wonder if she needs MRI.  No SOB. No exacerbating or alleviating sxs.  no other cold sxs. Has has some post nasal drip.   Current Medications (verified): 1)  Tylenol Jr Meltaways 160 Mg Tbdp (Acetaminophen) .... As Needed 2)  Xopenex Hfa 45 Mcg/act Aero (Levalbuterol Tartrate) .... Every 6 Hours Prn  Pls Fill ,  This Pt Cannot Tolerate Albuterol Due To Tachycardia 3)  Lidoderm 5 % Ptch (Lidocaine) .... As Needed 4)  Straight Cane .... Dx: Vertigo, Sacroilitis 5)  Zofran 4 Mg Tabs (Ondansetron Hcl) .... Take 1 Tablet By Mouth Two Times A Day As Needed Nausea 6)  Sinus Rinse Bottle Kit  Pack (Hypertonic Nasal Wash) .Marland Kitchen.. 1-2 Times Daily 7)  Prevacid Solutab 30 Mg Tbdp (Lansoprazole) .Marland Kitchen.. 1-2 Daily 8)  Xopenex 0.63 Mg/66ml Nebu (Levalbuterol Hcl) ....  Use in Nebulizer Three Times A Day As Needed Dx Severe Asthma 493.01 Cannot Take Albuterol 9)  Nasonex 50 Mcg/act  Susp (Mometasone Furoate) .... Two Puffs Each Nostril Daily  Allergies (verified): 1)  ! * Iron Iv/po 2)  ! * Singulair 3)  ! Cipro 4)  ! Pcn 5)  ! Erythromycin 6)  ! Sulfa 7)  ! Rocephin 8)  ! Flagyl 9)  ! * Bystolic 10)  ! Beta Blockers 11)  ! * Antiemetics 12)  ! Reglan 13)  ! * Antiinflammatories 14)  ! Trifluoperazine Hcl (Trifluoperazine Hcl) 15)  ! Butorphanol Tartrate (Butorphanol Tartrate) 16)  ! Lisinopril 17)  ! Clonidine Hcl (Clonidine Hcl) 18)  ! * Oral Steroids 19)  ! Macrobid (Nitrofurantoin Monohyd Macro) 20)  Zoloft 21)  Effexor 22)  Prozac 23)  Paxil 24)  Adhesive Tape  Comments:  Nurse/Medical Assistant: The patient's medications and allergies were reviewed with the patient and were updated in the Medication and Allergy Lists. Avon Gully CMA, Duncan Dull) (October 11, 2010 11:19 AM)  Physical Exam  General:  Well-developed,well-nourished,in no acute distress; alert,appropriate and cooperative throughout examination. Dry cough on exam today.  Head:  Normocephalic and atraumatic without obvious abnormalities. No apparent alopecia or balding. Eyes:  No corneal or conjunctival inflammation noted. EOMI. Perrla.  Ears:  External ear exam shows no significant lesions or deformities.  Otoscopic examination reveals clear canals, tympanic membranes are intact bilaterally without bulging, retraction, inflammation or discharge. Hearing is grossly normal bilaterally. Nose:  External nasal examination shows no deformity or inflammation. Nasal mucosa are pink and moist without lesions or exudates. Mouth:  Oral mucosa and oropharynx without lesions or exudates.  Teeth in good repair. Neck:  No deformities, masses, or tenderness noted. Lungs:  Normal respiratory effort, chest expands symmetrically. Lungs are clear to auscultation, no crackles or  wheezes. Heart:  Normal rate and regular rhythm. S1 and S2 normal without gallop, murmur, click, rub or other extra sounds. Skin:  no rashes.   Cervical Nodes:  No lymphadenopathy noted Psych:  Cognition and judgment appear intact. Alert and cooperative with normal attention span and concentration. No apparent delusions, illusions, hallucinations   Impression & Recommendations:  Problem # 1:  PHARYNGITIS-ACUTE (ICD-462) I don't truly appreciate any swelling or LN on exam. Certaily she has a sensation of fullness. Wil check CBC to rule out infection and check thyroid to rule out thyroiditis.  I did recommend she use her nasal saline to keep her sinuses clear since alsohaving post nasal drip. May also have some pain secondary to viral infection as there is a ST virus we have seen in the community lately that is not strep throat.  The following medications were removed from the medication list:    Levaquin 250 Mg Tabs (Levofloxacin) .Marland Kitchen... Take one by mouth daily Her updated medication list for this problem includes:    Tylenol Jr Meltaways 160 Mg Tbdp (Acetaminophen) .Marland Kitchen... As needed  Orders: T-CBC w/Diff [phone]) T-TSH [phone])  Complete Medication List: 1)  Tylenol Jr Meltaways 160 Mg Tbdp (Acetaminophen) .... As needed 2)  Xopenex Hfa 45 Mcg/act Aero (Levalbuterol tartrate) .... Every 6 hours prn  pls fill ,  this pt cannot tolerate albuterol due to tachycardia 3)  Lidoderm 5 % Ptch (Lidocaine) .... As needed 4)  Straight Cane  .... Dx: vertigo, sacroilitis 5)  Zofran 4 Mg Tabs (Ondansetron hcl) .... Take 1 tablet by mouth two times a day as needed nausea 6)  Sinus Rinse Bottle Kit Pack (Hypertonic nasal wash) .Marland Kitchen.. 1-2 times daily 7)  Prevacid Solutab 30 Mg Tbdp (Lansoprazole) .Marland Kitchen.. 1-2 daily 8)  Xopenex 0.63 Mg/69ml Nebu (Levalbuterol hcl) .... Use in nebulizer three times a day as needed dx severe asthma 493.01 cannot take albuterol 9)  Nasonex 50 Mcg/act Susp (Mometasone  furoate) .... Two puffs each nostril daily   Orders Added: 1)  T-CBC w/Diff [[id number]] 2)  T-TSH [[id number]] 3)  Est. Patient Level IV [28413]

## 2010-10-24 LAB — POCT I-STAT, CHEM 8
BUN: 4 mg/dL — ABNORMAL LOW (ref 6–23)
Calcium, Ion: 1.05 mmol/L — ABNORMAL LOW (ref 1.12–1.32)
Chloride: 106 mEq/L (ref 96–112)
Creatinine, Ser: 0.6 mg/dL (ref 0.4–1.2)
Glucose, Bld: 93 mg/dL (ref 70–99)
HCT: 43 % (ref 36.0–46.0)
Hemoglobin: 14.6 g/dL (ref 12.0–15.0)
Potassium: 3.4 mEq/L — ABNORMAL LOW (ref 3.5–5.1)
Sodium: 140 mEq/L (ref 135–145)
TCO2: 23 mmol/L (ref 0–100)

## 2010-10-24 IMAGING — CR DG ABDOMEN ACUTE W/ 1V CHEST
3 series · 3 of 3 positions shown · non-contrast
Comparison: Chest x-ray 06/10/2009.

CLINICAL DATA: Constipation, abdominal pain.

ACUTE ABDOMEN SERIES (ABDOMEN 2 VIEW & CHEST 1 VIEW)

[w chest pa]
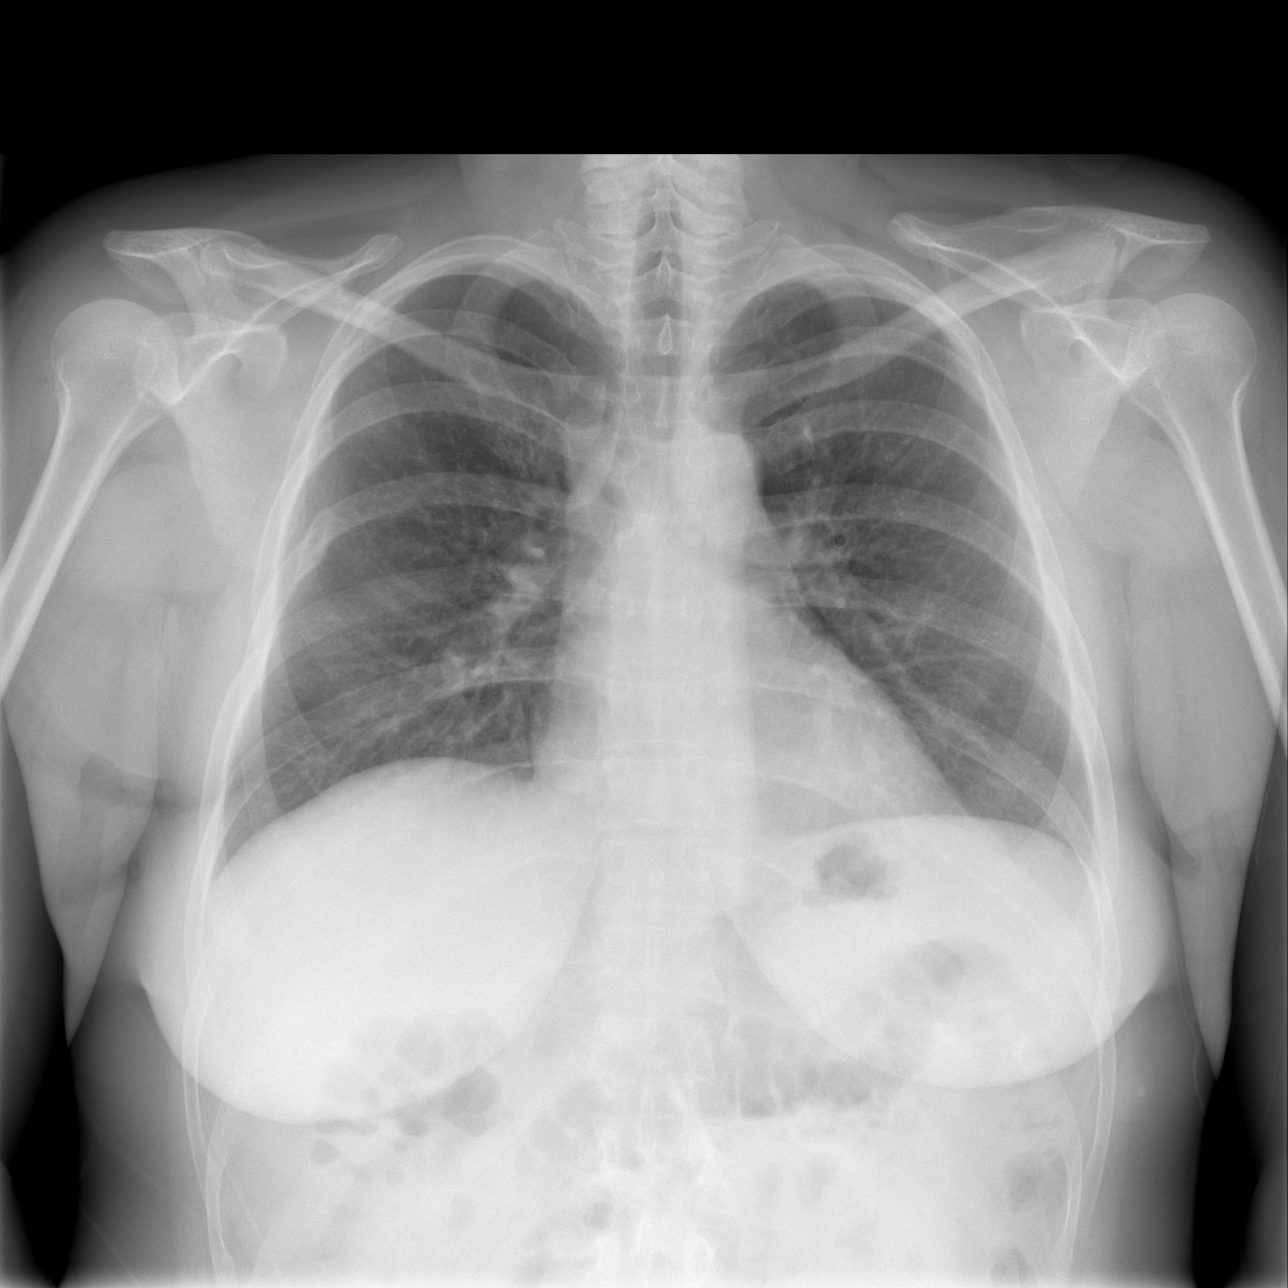

[w abdomen upright]
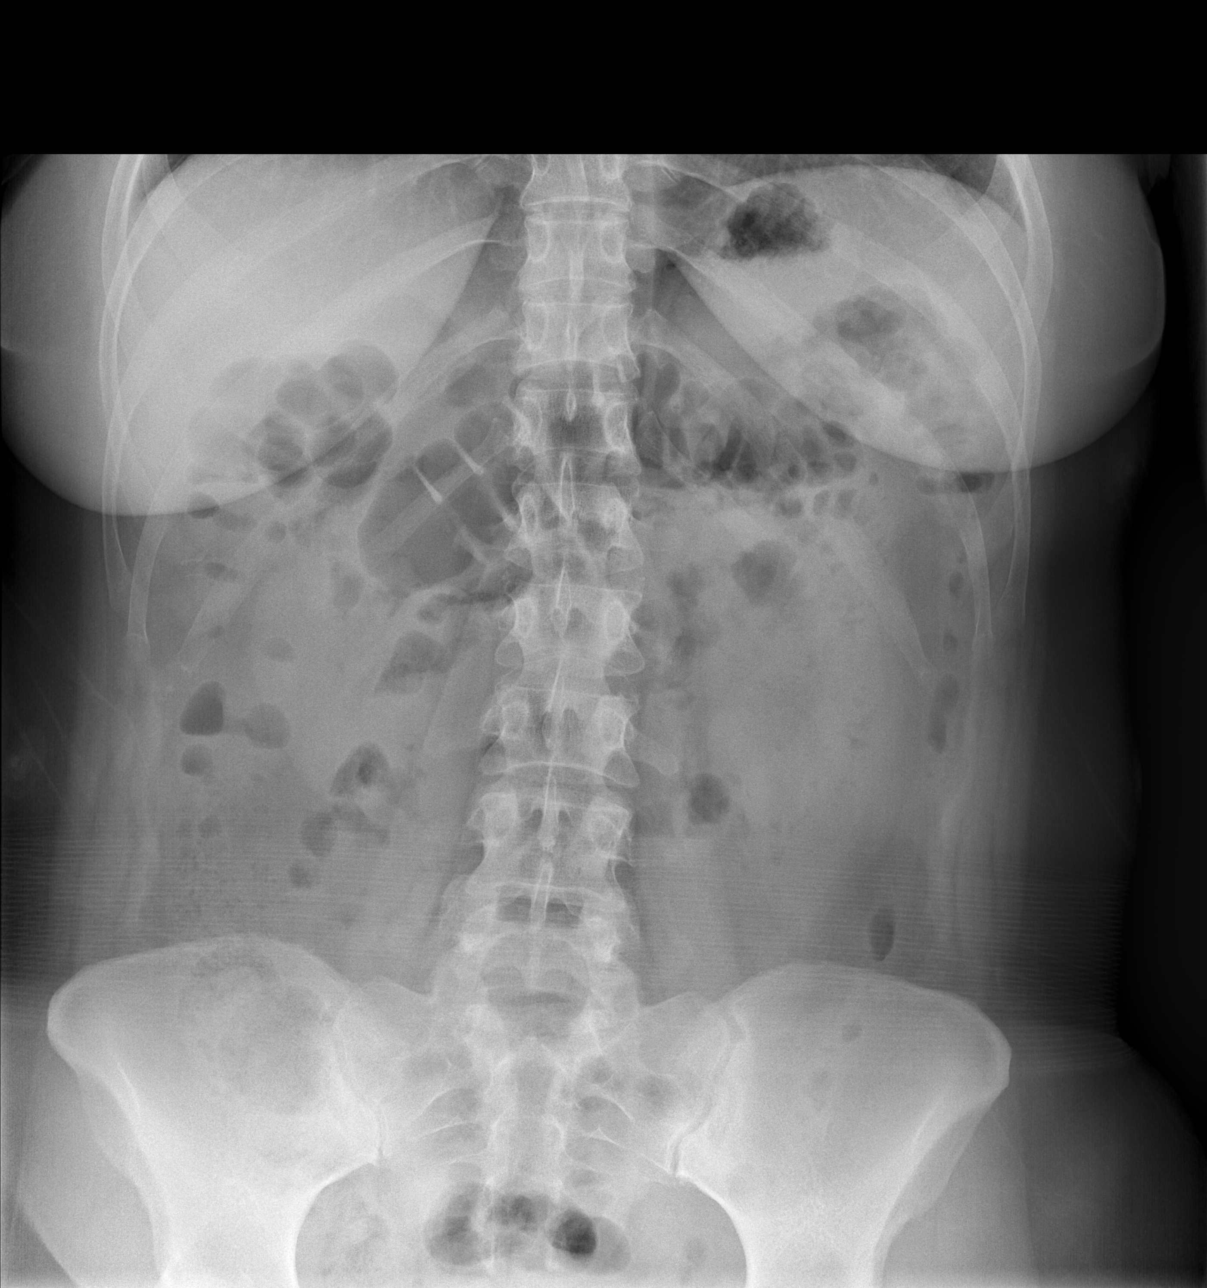

[t abdomen supine]
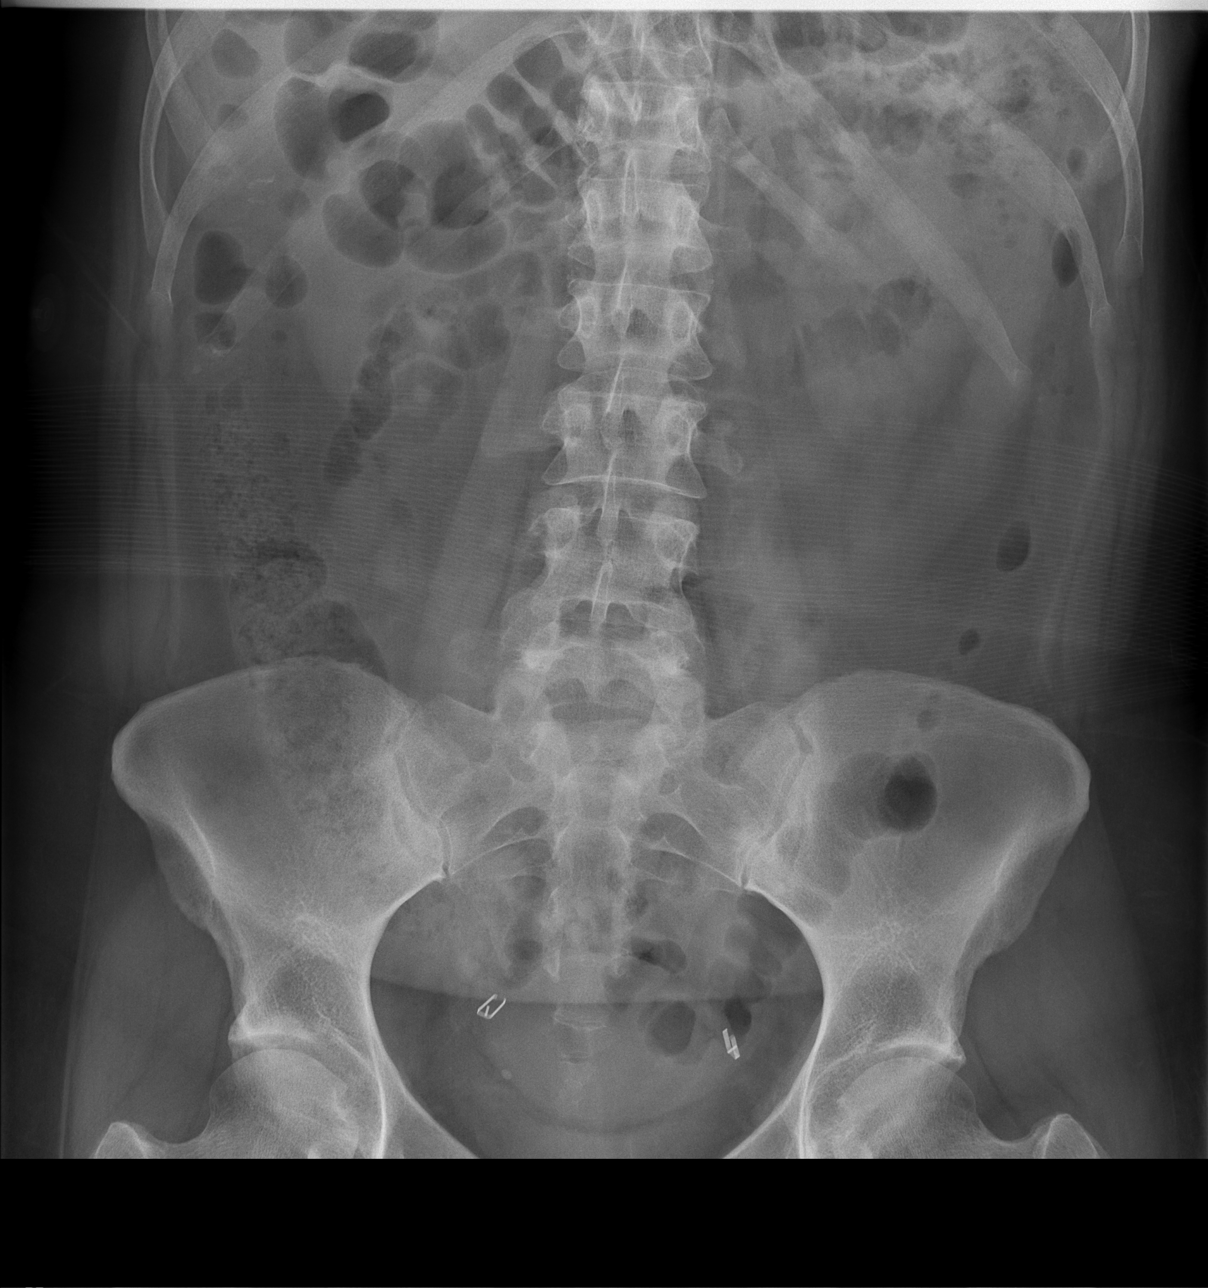

[3 of 3 positions shown; findings below may reference images not displayed]

FINDINGS: The bowel gas pattern is normal.  There is no evidence of
free intraperitoneal air.  No suspicious radio-opaque calculi or
other significant radiographic abnormality is seen. Heart size and
mediastinal contours are within normal limits.  Both lungs are
clear. Surgical clips in the pelvis from bilateral tubal ligation.
IMPRESSION: No acute findings.

## 2010-10-25 LAB — DIFFERENTIAL
Basophils Absolute: 0 10*3/uL (ref 0.0–0.1)
Basophils Relative: 0 % (ref 0–1)
Eosinophils Absolute: 0.2 10*3/uL (ref 0.0–0.7)
Eosinophils Relative: 2 % (ref 0–5)
Lymphocytes Relative: 21 % (ref 12–46)
Lymphs Abs: 1.4 10*3/uL (ref 0.7–4.0)
Monocytes Absolute: 0.7 10*3/uL (ref 0.1–1.0)
Monocytes Relative: 10 % (ref 3–12)
Neutro Abs: 4.6 10*3/uL (ref 1.7–7.7)
Neutrophils Relative %: 67 % (ref 43–77)

## 2010-10-25 LAB — URINALYSIS, ROUTINE W REFLEX MICROSCOPIC
Bilirubin Urine: NEGATIVE
Glucose, UA: NEGATIVE mg/dL
Hgb urine dipstick: NEGATIVE
Ketones, ur: NEGATIVE mg/dL
Nitrite: NEGATIVE
Protein, ur: NEGATIVE mg/dL
Specific Gravity, Urine: 1.027 (ref 1.005–1.030)
Urobilinogen, UA: 0.2 mg/dL (ref 0.0–1.0)
pH: 5.5 (ref 5.0–8.0)

## 2010-10-25 LAB — COMPREHENSIVE METABOLIC PANEL
ALT: 22 U/L (ref 0–35)
AST: 23 U/L (ref 0–37)
Albumin: 3.7 g/dL (ref 3.5–5.2)
Alkaline Phosphatase: 55 U/L (ref 39–117)
BUN: 10 mg/dL (ref 6–23)
CO2: 25 mEq/L (ref 19–32)
Calcium: 8.9 mg/dL (ref 8.4–10.5)
Chloride: 108 mEq/L (ref 96–112)
Creatinine, Ser: 0.52 mg/dL (ref 0.4–1.2)
GFR calc Af Amer: >60 mL/min (ref >60)
GFR calc non Af Amer: >60 mL/min (ref >60)
Glucose, Bld: 119 mg/dL — ABNORMAL HIGH (ref 70–99)
Potassium: 3.7 mEq/L (ref 3.5–5.1)
Sodium: 142 mEq/L (ref 135–145)
Total Bilirubin: 0.3 mg/dL (ref 0.3–1.2)
Total Protein: 6.6 g/dL (ref 6.0–8.3)

## 2010-10-25 LAB — CBC
HCT: 37.8 % (ref 36.0–46.0)
Hemoglobin: 12.9 g/dL (ref 12.0–15.0)
MCH: 31.1 pg (ref 26.0–34.0)
MCHC: 34 g/dL (ref 30.0–36.0)
MCV: 91.4 fL (ref 78.0–100.0)
Platelets: 193 10*3/uL (ref 150–400)
RBC: 4.14 MIL/uL (ref 3.87–5.11)
RDW: 13.8 % (ref 11.5–15.5)
WBC: 6.9 10*3/uL (ref 4.0–10.5)

## 2010-10-25 LAB — LIPASE, BLOOD: Lipase: 41 U/L (ref 11–59)

## 2010-10-25 LAB — POCT PREGNANCY, URINE: Preg Test, Ur: NEGATIVE

## 2010-10-27 LAB — URINALYSIS, ROUTINE W REFLEX MICROSCOPIC
Bilirubin Urine: NEGATIVE
Glucose, UA: NEGATIVE mg/dL
Ketones, ur: NEGATIVE mg/dL
Leukocytes, UA: NEGATIVE
Nitrite: NEGATIVE
Protein, ur: NEGATIVE mg/dL
Specific Gravity, Urine: 1.016 (ref 1.005–1.030)
Urobilinogen, UA: 0.2 mg/dL (ref 0.0–1.0)
pH: 6.5 (ref 5.0–8.0)

## 2010-10-27 LAB — POCT I-STAT, CHEM 8
BUN: 12 mg/dL (ref 6–23)
Calcium, Ion: 1.13 mmol/L (ref 1.12–1.32)
Chloride: 108 mEq/L (ref 96–112)
Creatinine, Ser: 0.5 mg/dL (ref 0.4–1.2)
Glucose, Bld: 101 mg/dL — ABNORMAL HIGH (ref 70–99)
HCT: 39 % (ref 36.0–46.0)
Hemoglobin: 13.3 g/dL (ref 12.0–15.0)
Potassium: 3.4 mEq/L — ABNORMAL LOW (ref 3.5–5.1)
Sodium: 143 mEq/L (ref 135–145)
TCO2: 24 mmol/L (ref 0–100)

## 2010-10-27 LAB — URINE MICROSCOPIC-ADD ON

## 2010-10-27 LAB — POCT CARDIAC MARKERS
CKMB, poc: <1 ng/mL — ABNORMAL LOW (ref 1.0–8.0)
Myoglobin, poc: 25.7 ng/mL (ref 12–200)
Troponin i, poc: <0.05 ng/mL (ref 0.00–0.09)

## 2010-10-27 LAB — RAPID STREP SCREEN (MED CTR MEBANE ONLY): Streptococcus, Group A Screen (Direct): NEGATIVE

## 2010-10-28 LAB — PREGNANCY, URINE: Preg Test, Ur: NEGATIVE

## 2010-10-28 LAB — POCT CARDIAC MARKERS
CKMB, poc: <1 ng/mL — ABNORMAL LOW (ref 1.0–8.0)
CKMB, poc: <1 ng/mL — ABNORMAL LOW (ref 1.0–8.0)
Myoglobin, poc: 22.7 ng/mL (ref 12–200)
Myoglobin, poc: 23 ng/mL (ref 12–200)
Troponin i, poc: <0.05 ng/mL (ref 0.00–0.09)
Troponin i, poc: <0.05 ng/mL (ref 0.00–0.09)

## 2010-10-28 LAB — DIFFERENTIAL
Basophils Absolute: 0.2 10*3/uL — ABNORMAL HIGH (ref 0.0–0.1)
Basophils Relative: 3 % — ABNORMAL HIGH (ref 0–1)
Eosinophils Absolute: 0.1 10*3/uL (ref 0.0–0.7)
Eosinophils Relative: 2 % (ref 0–5)
Lymphocytes Relative: 27 % (ref 12–46)
Lymphs Abs: 1.9 10*3/uL (ref 0.7–4.0)
Monocytes Absolute: 0.5 10*3/uL (ref 0.1–1.0)
Monocytes Relative: 7 % (ref 3–12)
Neutro Abs: 4.3 10*3/uL (ref 1.7–7.7)
Neutrophils Relative %: 61 % (ref 43–77)
Smear Review: ADEQUATE

## 2010-10-28 LAB — POCT I-STAT, CHEM 8
BUN: 13 mg/dL (ref 6–23)
Calcium, Ion: 1.06 mmol/L — ABNORMAL LOW (ref 1.12–1.32)
Chloride: 111 mEq/L (ref 96–112)
Creatinine, Ser: 0.7 mg/dL (ref 0.4–1.2)
Glucose, Bld: 103 mg/dL — ABNORMAL HIGH (ref 70–99)
HCT: 41 % (ref 36.0–46.0)
Hemoglobin: 13.9 g/dL (ref 12.0–15.0)
Potassium: 3.8 mEq/L (ref 3.5–5.1)
Sodium: 142 mEq/L (ref 135–145)
TCO2: 22 mmol/L (ref 0–100)

## 2010-10-28 LAB — CBC
HCT: 38.7 % (ref 36.0–46.0)
Hemoglobin: 13.4 g/dL (ref 12.0–15.0)
MCH: 30.3 pg (ref 26.0–34.0)
MCHC: 34.8 g/dL (ref 30.0–36.0)
MCV: 87 fL (ref 78.0–100.0)
Platelets: ADEQUATE 10*3/uL (ref 150–400)
RBC: 4.44 MIL/uL (ref 3.87–5.11)
RDW: 17.6 % — ABNORMAL HIGH (ref 11.5–15.5)
WBC: 7 10*3/uL (ref 4.0–10.5)

## 2010-10-28 LAB — LIPASE, BLOOD: Lipase: 156 U/L (ref 23–300)

## 2010-10-28 LAB — URINALYSIS, ROUTINE W REFLEX MICROSCOPIC
Bilirubin Urine: NEGATIVE
Glucose, UA: NEGATIVE mg/dL
Hgb urine dipstick: NEGATIVE
Ketones, ur: NEGATIVE mg/dL
Nitrite: NEGATIVE
Protein, ur: NEGATIVE mg/dL
Specific Gravity, Urine: 1.023 (ref 1.005–1.030)
Urobilinogen, UA: 0.2 mg/dL (ref 0.0–1.0)
pH: 6.5 (ref 5.0–8.0)

## 2010-10-28 LAB — COMPREHENSIVE METABOLIC PANEL
ALT: 21 U/L (ref 0–35)
AST: 24 U/L (ref 0–37)
Albumin: 4 g/dL (ref 3.5–5.2)
Alkaline Phosphatase: 56 U/L (ref 39–117)
BUN: 14 mg/dL (ref 6–23)
CO2: 24 mEq/L (ref 19–32)
Calcium: 9 mg/dL (ref 8.4–10.5)
Chloride: 108 mEq/L (ref 96–112)
Creatinine, Ser: 0.5 mg/dL (ref 0.4–1.2)
GFR calc Af Amer: >60 mL/min (ref >60)
GFR calc non Af Amer: >60 mL/min (ref >60)
Glucose, Bld: 100 mg/dL — ABNORMAL HIGH (ref 70–99)
Potassium: 4.4 mEq/L (ref 3.5–5.1)
Sodium: 141 mEq/L (ref 135–145)
Total Bilirubin: 0.6 mg/dL (ref 0.3–1.2)
Total Protein: 7.5 g/dL (ref 6.0–8.3)

## 2010-10-28 LAB — D-DIMER, QUANTITATIVE (NOT AT ARMC): D-Dimer, Quant: 0.27 ug/mL-FEU (ref 0.00–0.48)

## 2010-10-29 LAB — COMPREHENSIVE METABOLIC PANEL
ALT: 17 U/L (ref 0–35)
AST: 14 U/L (ref 0–37)
Albumin: 3.8 g/dL (ref 3.5–5.2)
Alkaline Phosphatase: 57 U/L (ref 39–117)
BUN: 12 mg/dL (ref 6–23)
CO2: 25 mEq/L (ref 19–32)
Calcium: 8.5 mg/dL (ref 8.4–10.5)
Chloride: 111 mEq/L (ref 96–112)
Creatinine, Ser: 0.6 mg/dL (ref 0.4–1.2)
GFR calc Af Amer: >60 mL/min (ref >60)
GFR calc non Af Amer: >60 mL/min (ref >60)
Glucose, Bld: 96 mg/dL (ref 70–99)
Potassium: 4.1 mEq/L (ref 3.5–5.1)
Sodium: 144 mEq/L (ref 135–145)
Total Bilirubin: 0.5 mg/dL (ref 0.3–1.2)
Total Protein: 7 g/dL (ref 6.0–8.3)

## 2010-10-30 LAB — URINALYSIS, ROUTINE W REFLEX MICROSCOPIC
Bilirubin Urine: NEGATIVE
Glucose, UA: NEGATIVE mg/dL
Ketones, ur: NEGATIVE mg/dL
Leukocytes, UA: NEGATIVE
Nitrite: NEGATIVE
Protein, ur: NEGATIVE mg/dL
Specific Gravity, Urine: 1.022 (ref 1.005–1.030)
Urobilinogen, UA: 0.2 mg/dL (ref 0.0–1.0)
pH: 6 (ref 5.0–8.0)

## 2010-10-30 LAB — URINE CULTURE: Colony Count: 5000

## 2010-10-30 LAB — URINE MICROSCOPIC-ADD ON

## 2010-10-30 LAB — PREGNANCY, URINE: Preg Test, Ur: NEGATIVE

## 2010-10-31 LAB — DIFFERENTIAL
Basophils Absolute: 0 10*3/uL (ref 0.0–0.1)
Basophils Relative: 0 % (ref 0–1)
Eosinophils Absolute: 0.1 10*3/uL (ref 0.0–0.7)
Eosinophils Relative: 2 % (ref 0–5)
Lymphocytes Relative: 22 % (ref 12–46)
Lymphs Abs: 1.2 10*3/uL (ref 0.7–4.0)
Monocytes Absolute: 0.5 10*3/uL (ref 0.1–1.0)
Monocytes Relative: 10 % (ref 3–12)
Neutro Abs: 3.6 10*3/uL (ref 1.7–7.7)
Neutrophils Relative %: 66 % (ref 43–77)

## 2010-10-31 LAB — URINALYSIS, ROUTINE W REFLEX MICROSCOPIC
Bilirubin Urine: NEGATIVE
Glucose, UA: NEGATIVE mg/dL
Hgb urine dipstick: NEGATIVE
Ketones, ur: NEGATIVE mg/dL
Nitrite: NEGATIVE
Protein, ur: NEGATIVE mg/dL
Specific Gravity, Urine: 1.021 (ref 1.005–1.030)
Urobilinogen, UA: 0.2 mg/dL (ref 0.0–1.0)
pH: 8 (ref 5.0–8.0)

## 2010-10-31 LAB — CBC
HCT: 35.2 % — ABNORMAL LOW (ref 36.0–46.0)
Hemoglobin: 11.3 g/dL — ABNORMAL LOW (ref 12.0–15.0)
MCHC: 32 g/dL (ref 30.0–36.0)
MCV: 83.3 fL (ref 78.0–100.0)
Platelets: 197 10*3/uL (ref 150–400)
RBC: 4.22 MIL/uL (ref 3.87–5.11)
RDW: 17.8 % — ABNORMAL HIGH (ref 11.5–15.5)
WBC: 5.5 10*3/uL (ref 4.0–10.5)

## 2010-10-31 LAB — POCT I-STAT, CHEM 8
BUN: 11 mg/dL (ref 6–23)
BUN: 11 mg/dL (ref 6–23)
Calcium, Ion: 1.08 mmol/L — ABNORMAL LOW (ref 1.12–1.32)
Calcium, Ion: 1.09 mmol/L — ABNORMAL LOW (ref 1.12–1.32)
Chloride: 107 mEq/L (ref 96–112)
Chloride: 108 mEq/L (ref 96–112)
Creatinine, Ser: 0.6 mg/dL (ref 0.4–1.2)
Creatinine, Ser: 0.7 mg/dL (ref 0.4–1.2)
Glucose, Bld: 100 mg/dL — ABNORMAL HIGH (ref 70–99)
Glucose, Bld: 101 mg/dL — ABNORMAL HIGH (ref 70–99)
HCT: 36 % (ref 36.0–46.0)
HCT: 36 % (ref 36.0–46.0)
Hemoglobin: 12.2 g/dL (ref 12.0–15.0)
Hemoglobin: 12.2 g/dL (ref 12.0–15.0)
Potassium: 3.6 mEq/L (ref 3.5–5.1)
Potassium: 3.6 mEq/L (ref 3.5–5.1)
Sodium: 141 mEq/L (ref 135–145)
Sodium: 142 mEq/L (ref 135–145)
TCO2: 25 mmol/L (ref 0–100)
TCO2: 25 mmol/L (ref 0–100)

## 2010-10-31 LAB — POCT CARDIAC MARKERS
CKMB, poc: <1 ng/mL — ABNORMAL LOW (ref 1.0–8.0)
Myoglobin, poc: 28 ng/mL (ref 12–200)
Troponin i, poc: <0.05 ng/mL (ref 0.00–0.09)

## 2010-10-31 LAB — PREGNANCY, URINE: Preg Test, Ur: NEGATIVE

## 2010-11-01 LAB — COMPREHENSIVE METABOLIC PANEL
ALT: 23 U/L (ref 0–35)
AST: 23 U/L (ref 0–37)
Albumin: 4.3 g/dL (ref 3.5–5.2)
Alkaline Phosphatase: 70 U/L (ref 39–117)
BUN: 14 mg/dL (ref 6–23)
CO2: 25 mEq/L (ref 19–32)
Calcium: 9.3 mg/dL (ref 8.4–10.5)
Chloride: 108 mEq/L (ref 96–112)
Creatinine, Ser: 0.6 mg/dL (ref 0.4–1.2)
GFR calc Af Amer: >60 mL/min (ref >60)
GFR calc non Af Amer: >60 mL/min (ref >60)
Glucose, Bld: 83 mg/dL (ref 70–99)
Potassium: 3.3 mEq/L — ABNORMAL LOW (ref 3.5–5.1)
Sodium: 143 mEq/L (ref 135–145)
Total Bilirubin: 0.4 mg/dL (ref 0.3–1.2)
Total Protein: 8.1 g/dL (ref 6.0–8.3)

## 2010-11-01 LAB — POCT CARDIAC MARKERS
CKMB, poc: <1 ng/mL — ABNORMAL LOW (ref 1.0–8.0)
Myoglobin, poc: 38.9 ng/mL (ref 12–200)
Troponin i, poc: <0.05 ng/mL (ref 0.00–0.09)

## 2010-11-01 LAB — CBC
HCT: 34.4 % — ABNORMAL LOW (ref 36.0–46.0)
Hemoglobin: 11.4 g/dL — ABNORMAL LOW (ref 12.0–15.0)
MCHC: 33 g/dL (ref 30.0–36.0)
MCV: 80.1 fL (ref 78.0–100.0)
Platelets: 192 10*3/uL (ref 150–400)
RBC: 4.3 MIL/uL (ref 3.87–5.11)
RDW: 16.4 % — ABNORMAL HIGH (ref 11.5–15.5)
WBC: 6.1 10*3/uL (ref 4.0–10.5)

## 2010-11-01 LAB — DIFFERENTIAL
Basophils Absolute: 0.1 10*3/uL (ref 0.0–0.1)
Basophils Relative: 1 % (ref 0–1)
Eosinophils Absolute: 0.1 10*3/uL (ref 0.0–0.7)
Eosinophils Relative: 2 % (ref 0–5)
Lymphocytes Relative: 24 % (ref 12–46)
Lymphs Abs: 1.4 10*3/uL (ref 0.7–4.0)
Monocytes Absolute: 0.5 10*3/uL (ref 0.1–1.0)
Monocytes Relative: 8 % (ref 3–12)
Neutro Abs: 4 10*3/uL (ref 1.7–7.7)
Neutrophils Relative %: 65 % (ref 43–77)

## 2010-11-01 LAB — LIPASE, BLOOD: Lipase: 162 U/L (ref 23–300)

## 2010-11-01 NOTE — Procedures (Signed)
Summary: [facility]  Upper [facility] Endoscopy/[facility]   Imported By: Maryln Gottron 10/25/2010 14:27:06  _____________________________________________________________________  External Attachment:    Type:   Image     Comment:   External Document

## 2010-11-01 NOTE — Letter (Signed)
Summary: [facility]   Imported By: Maryln Gottron 10/25/2010 13:03:20  _____________________________________________________________________  External Attachment:    Type:   Image     Comment:   External Document

## 2010-11-01 NOTE — Letter (Signed)
Summary: [facility]  [facility]   Imported By: Maryln Gottron 10/24/2010 15:56:12  _____________________________________________________________________  External Attachment:    Type:   Image     Comment:   External Document

## 2010-11-02 LAB — BASIC METABOLIC PANEL
BUN: 18 mg/dL (ref 6–23)
CO2: 26 mEq/L (ref 19–32)
Calcium: 8.9 mg/dL (ref 8.4–10.5)
Chloride: 108 mEq/L (ref 96–112)
Creatinine, Ser: 0.6 mg/dL (ref 0.4–1.2)
GFR calc Af Amer: >60 mL/min (ref >60)
GFR calc non Af Amer: >60 mL/min (ref >60)
Glucose, Bld: 102 mg/dL — ABNORMAL HIGH (ref 70–99)
Potassium: 4 mEq/L (ref 3.5–5.1)
Sodium: 143 mEq/L (ref 135–145)

## 2010-11-02 LAB — DIFFERENTIAL
Basophils Absolute: 0.1 10*3/uL (ref 0.0–0.1)
Basophils Relative: 1 % (ref 0–1)
Eosinophils Absolute: 0.1 10*3/uL (ref 0.0–0.7)
Eosinophils Relative: 2 % (ref 0–5)
Lymphocytes Relative: 28 % (ref 12–46)
Lymphs Abs: 1.5 10*3/uL (ref 0.7–4.0)
Monocytes Absolute: 0.4 10*3/uL (ref 0.1–1.0)
Monocytes Relative: 8 % (ref 3–12)
Neutro Abs: 3.2 10*3/uL (ref 1.7–7.7)
Neutrophils Relative %: 61 % (ref 43–77)

## 2010-11-02 LAB — CBC
HCT: 33.6 % — ABNORMAL LOW (ref 36.0–46.0)
Hemoglobin: 11.2 g/dL — ABNORMAL LOW (ref 12.0–15.0)
MCHC: 33.3 g/dL (ref 30.0–36.0)
MCV: 79.5 fL (ref 78.0–100.0)
Platelets: 224 10*3/uL (ref 150–400)
RBC: 4.22 MIL/uL (ref 3.87–5.11)
RDW: 15.9 % — ABNORMAL HIGH (ref 11.5–15.5)
WBC: 5.3 10*3/uL (ref 4.0–10.5)

## 2010-11-02 LAB — POCT CARDIAC MARKERS
CKMB, poc: <1 ng/mL — ABNORMAL LOW (ref 1.0–8.0)
Myoglobin, poc: 16.1 ng/mL (ref 12–200)
Troponin i, poc: <0.05 ng/mL (ref 0.00–0.09)

## 2010-11-02 IMAGING — US US ABDOMEN COMPLETE
1 series · 13 of 25 positions shown · non-contrast
Comparison: Abdominal ultrasound 03/17/2009 and CT abdomen pelvis
03/01/2009.

CLINICAL DATA: Abdominal back pain and chest discomfort.  The
patient had not been n.p.o. no prior to this examination.

ABDOMINAL ULTRASOUND COMPLETE

[Series 1: us abdomen complete · 0.35mm/px · 13 of 64 slices shown]
[im 1/64]
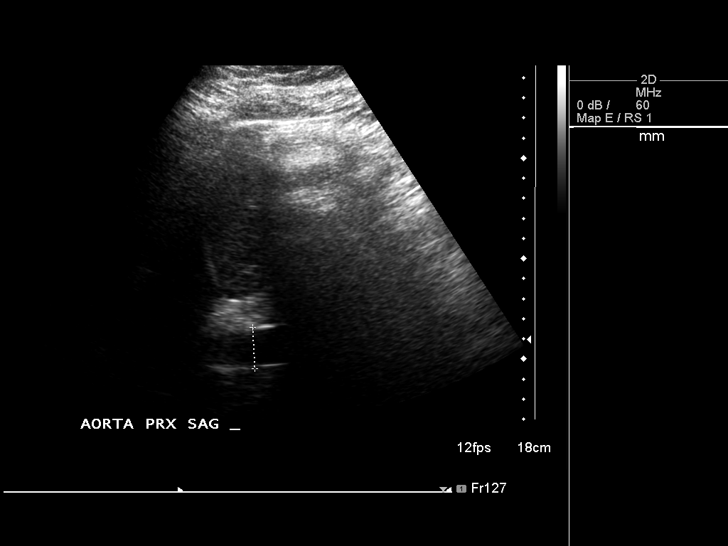
[im 6/64]
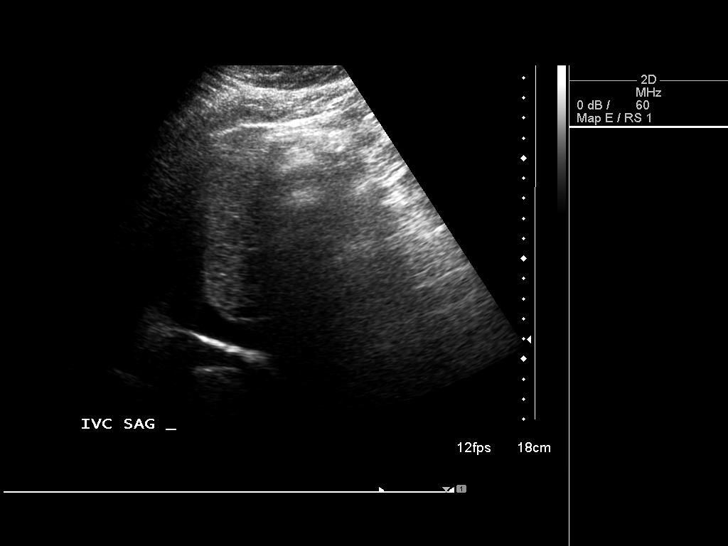
[im 11/64]
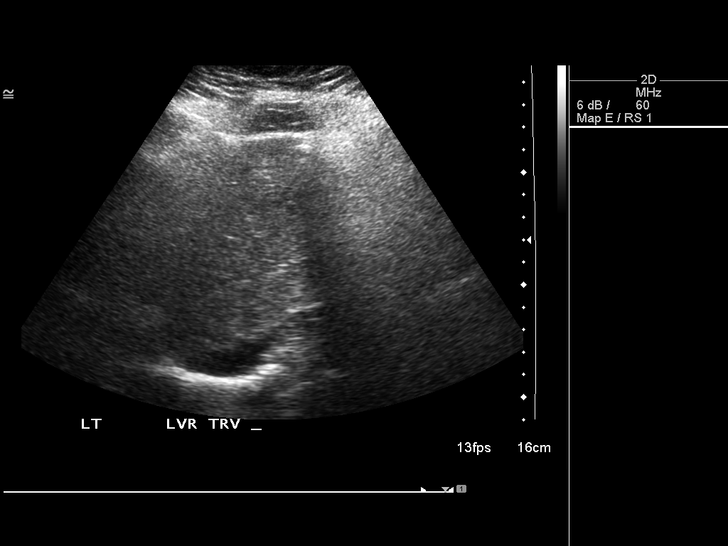
[im 16/64]
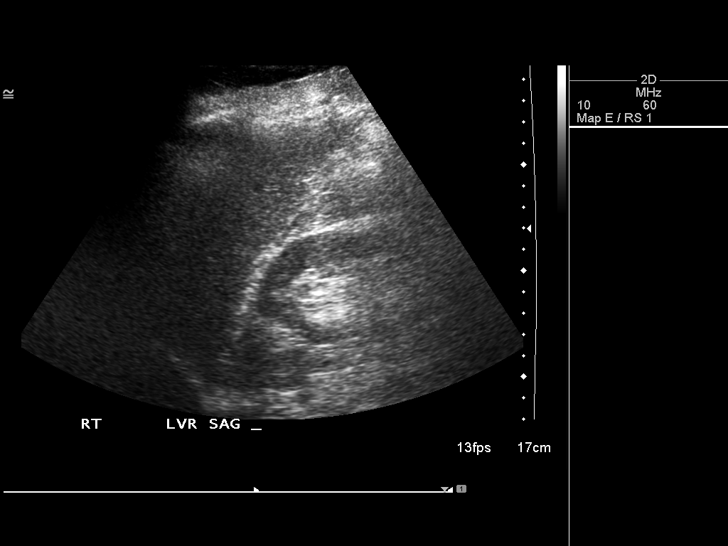
[im 22/64]
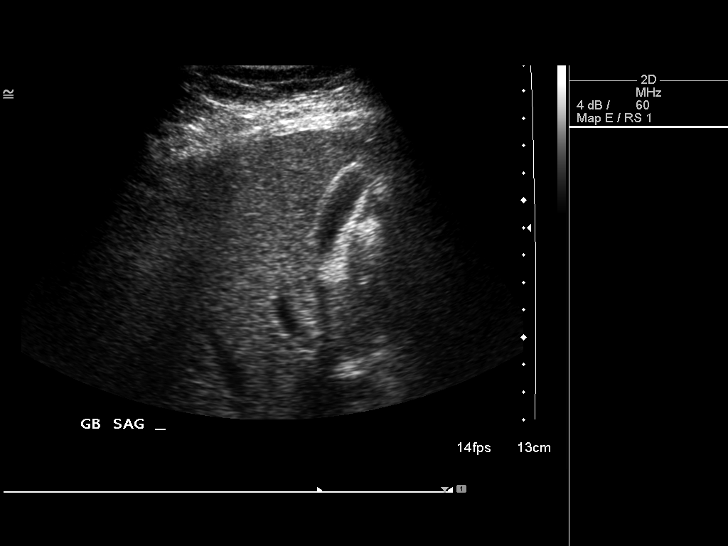
[im 27/64]
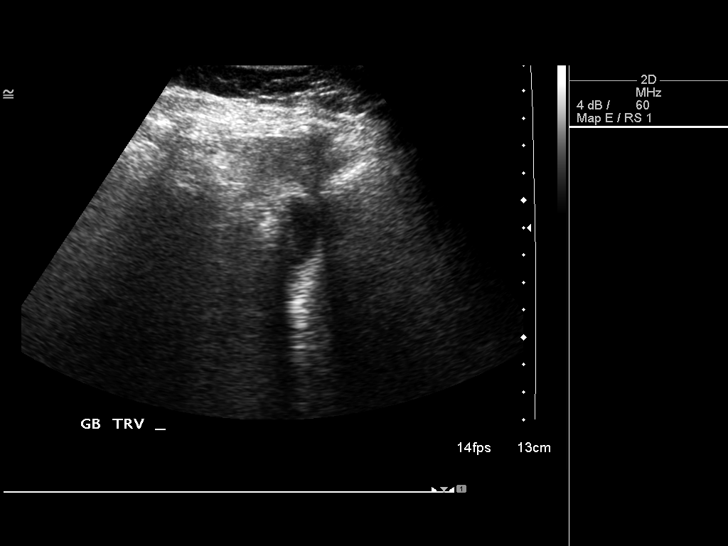
[im 32/64]
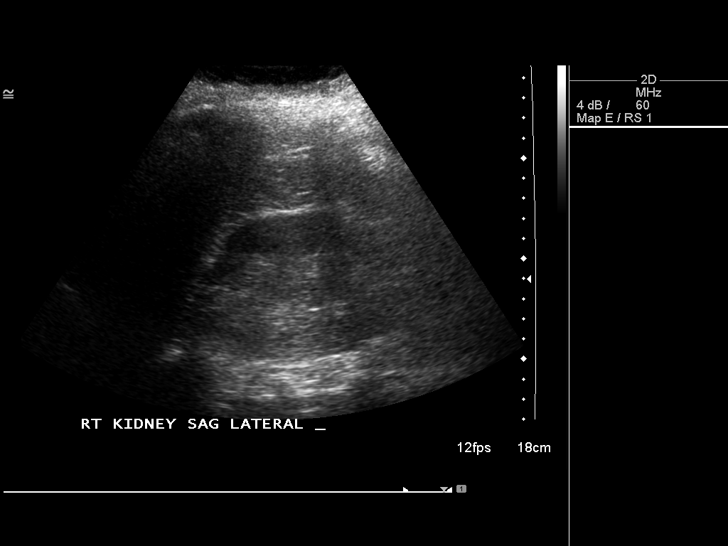
[im 37/64]
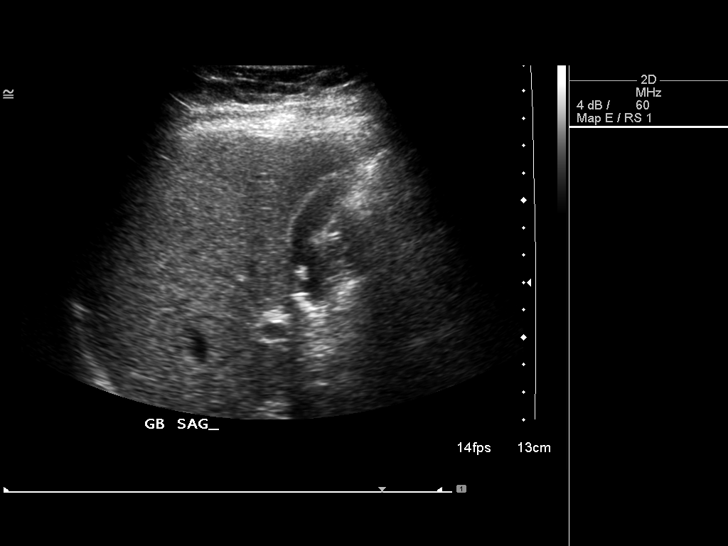
[im 43/64]
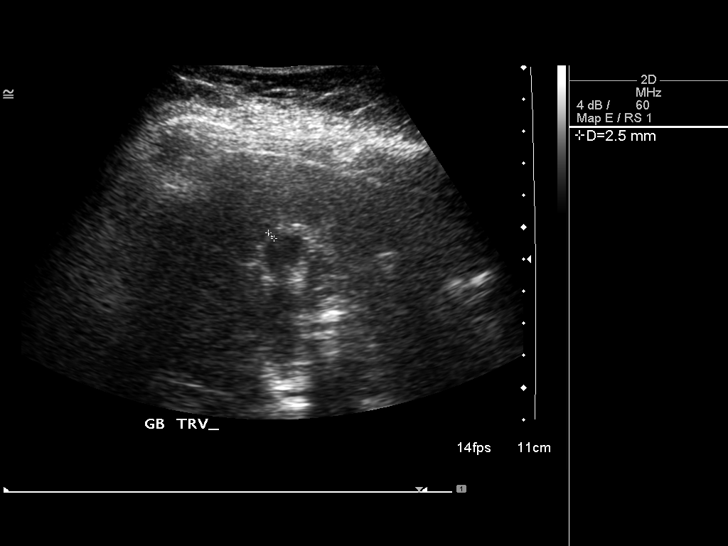
[im 48/64]
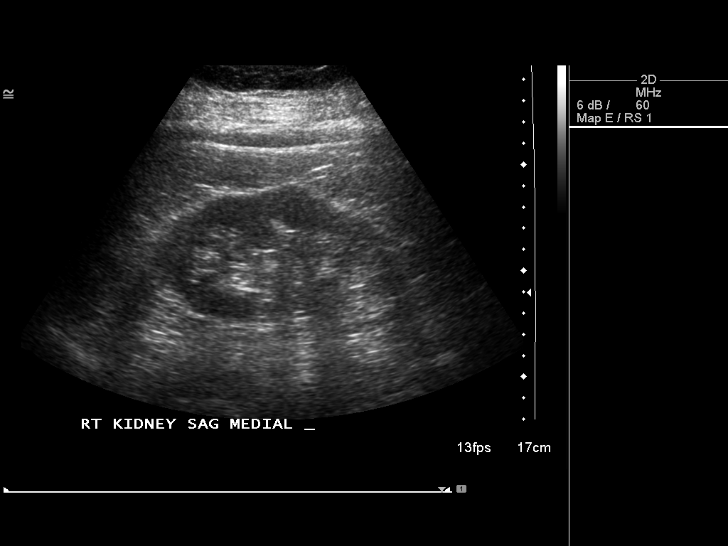
[im 53/64]
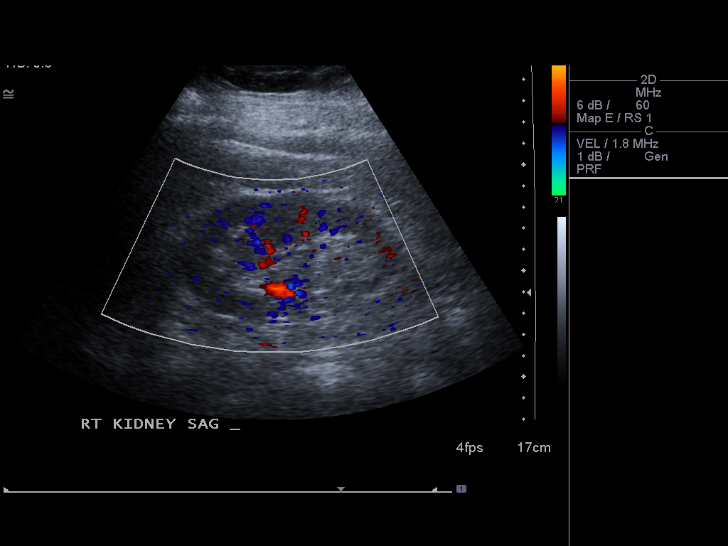
[im 58/64]
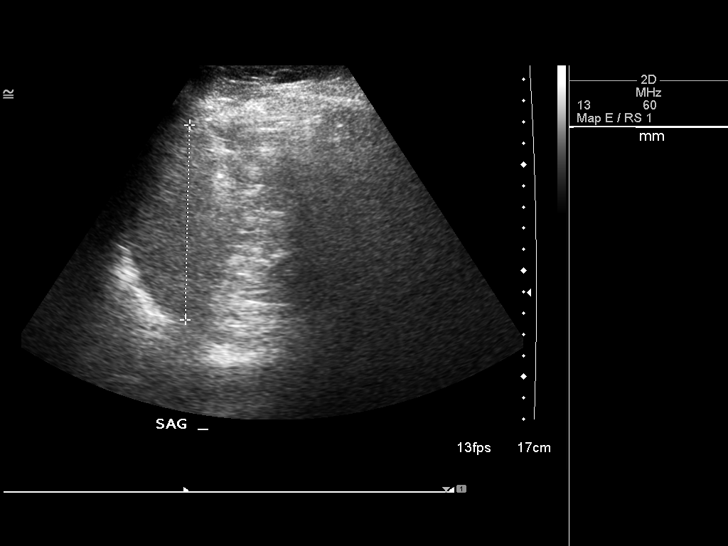
[im 64/64]
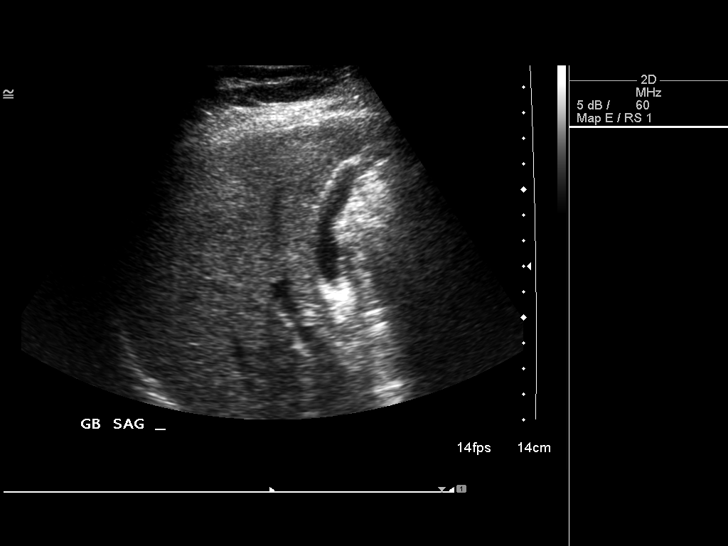

[13 of 25 positions shown; findings below may reference images not displayed]

FINDINGS: Gallbladder:  The gallbladder is not very distended at the time of
imaging, as the patient was not n.p.o. prior to the exam.  No
definite gallstones.  A 5 mm echogenic focus along the gallbladder
wall is identified with the patient [REDACTED]ubitus position, but the
sonographer reports that this area could not be visualized
subsequently during the examination.  The gallbladder wall is
within normal limits for thickness, measuring 2.5 to 3 mm.  No
pericholecystic fluid is identified.

Common Bile Duct:  The common bile duct could not be discretely
identified.

Liver:  No focal lesion identified.  Within normal limits in
parenchymal echogenicity.

IVC:  Visualization limited by bowel gas.

Pancreas:  Not well visualized, due to bowel gas.

Spleen:  Measures 9.2 cm in length, within normal limits for size.
Images somewhat limited by bowel gas.

Right kidney:  Normal in size and parenchymal echogenicity.  No
evidence of mass or hydronephrosis.   10.2 cm in length.

Left kidney:  Normal in size and parenchymal echogenicity.  No
evidence of mass or hydronephrosis.  10.8 cm in length.

Abdominal Aorta:  No aneurysm identified.  Evaluation limited by
bowel gas.

Comment:  The technologist reports that the study was difficult due
to a large amount of bowel gas.
IMPRESSION: 1. Abdominal ultrasound technically limited by prominent bowel gas.
Gallbladder not very distended , as the patient was not n.p.o. for
the exam.
2.  No definite gallstones identified.  A 5 mm nonshadowing
echogenic focus associated with the gallbladder wall was seen in
one projection, and could not be reproduced later during
examination.  Considerations include artifact, a small polyp, or a
single nonshadowing stone.
3.  Gallbladder wall thickness is normal.
4.  Nonvisualization of the common bile duct, pancreas, and
inferior vena cava.

## 2010-11-04 ENCOUNTER — Emergency Department (HOSPITAL_BASED_OUTPATIENT_CLINIC_OR_DEPARTMENT_OTHER)
Admission: EM | Admit: 2010-11-04 | Discharge: 2010-11-04 | Disposition: A | Discharge status: Home / Self Care | Payer: Medicare Other | Attending: Emergency Medicine | Admitting: Emergency Medicine

## 2010-11-04 DIAGNOSIS — M79609 Pain in unspecified limb: Secondary | ICD-10-CM | POA: Insufficient documentation

## 2010-11-04 DIAGNOSIS — I10 Essential (primary) hypertension: Secondary | ICD-10-CM | POA: Insufficient documentation

## 2010-11-04 DIAGNOSIS — G8929 Other chronic pain: Secondary | ICD-10-CM | POA: Insufficient documentation

## 2010-11-06 LAB — CBC
HCT: 35.8 % — ABNORMAL LOW (ref 36.0–46.0)
Hemoglobin: 11.8 g/dL — ABNORMAL LOW (ref 12.0–15.0)
MCHC: 32.9 g/dL (ref 30.0–36.0)
MCV: 80 fL (ref 78.0–100.0)
Platelets: 219 10*3/uL (ref 150–400)
RBC: 4.48 MIL/uL (ref 3.87–5.11)
RDW: 16.5 % — ABNORMAL HIGH (ref 11.5–15.5)
WBC: 5.9 10*3/uL (ref 4.0–10.5)

## 2010-11-06 LAB — DIFFERENTIAL
Basophils Absolute: 0 10*3/uL (ref 0.0–0.1)
Basophils Relative: 1 % (ref 0–1)
Eosinophils Absolute: 0.1 10*3/uL (ref 0.0–0.7)
Eosinophils Relative: 1 % (ref 0–5)
Lymphocytes Relative: 25 % (ref 12–46)
Lymphs Abs: 1.5 10*3/uL (ref 0.7–4.0)
Monocytes Absolute: 0.4 10*3/uL (ref 0.1–1.0)
Monocytes Relative: 7 % (ref 3–12)
Neutro Abs: 3.9 10*3/uL (ref 1.7–7.7)
Neutrophils Relative %: 66 % (ref 43–77)

## 2010-11-06 LAB — BASIC METABOLIC PANEL
BUN: 12 mg/dL (ref 6–23)
CO2: 27 mEq/L (ref 19–32)
Calcium: 8.7 mg/dL (ref 8.4–10.5)
Chloride: 110 mEq/L (ref 96–112)
Creatinine, Ser: 0.6 mg/dL (ref 0.4–1.2)
GFR calc Af Amer: >60 mL/min (ref >60)
GFR calc non Af Amer: >60 mL/min (ref >60)
Glucose, Bld: 76 mg/dL (ref 70–99)
Potassium: 3.9 mEq/L (ref 3.5–5.1)
Sodium: 144 mEq/L (ref 135–145)

## 2010-11-06 LAB — POCT CARDIAC MARKERS
CKMB, poc: <1 ng/mL — ABNORMAL LOW (ref 1.0–8.0)
Myoglobin, poc: 33.8 ng/mL (ref 12–200)
Troponin i, poc: <0.05 ng/mL (ref 0.00–0.09)

## 2010-11-06 LAB — POCT B-TYPE NATRIURETIC PEPTIDE (BNP): B Natriuretic Peptide, POC: 6.8 pg/mL (ref 0–100)

## 2010-11-14 LAB — COMPREHENSIVE METABOLIC PANEL
ALT: 20 U/L (ref 0–35)
ALT: 21 U/L (ref 0–35)
AST: 17 U/L (ref 0–37)
AST: 18 U/L (ref 0–37)
Albumin: 4 g/dL (ref 3.5–5.2)
Albumin: 4.4 g/dL (ref 3.5–5.2)
Alkaline Phosphatase: 57 U/L (ref 39–117)
Alkaline Phosphatase: 64 U/L (ref 39–117)
BUN: 10 mg/dL (ref 6–23)
BUN: 10 mg/dL (ref 6–23)
CO2: 24 mEq/L (ref 19–32)
CO2: 25 mEq/L (ref 19–32)
Calcium: 8.9 mg/dL (ref 8.4–10.5)
Calcium: 9.1 mg/dL (ref 8.4–10.5)
Chloride: 107 mEq/L (ref 96–112)
Chloride: 108 mEq/L (ref 96–112)
Creatinine, Ser: 0.5 mg/dL (ref 0.4–1.2)
Creatinine, Ser: 0.6 mg/dL (ref 0.4–1.2)
GFR calc Af Amer: >60 mL/min (ref >60)
GFR calc Af Amer: >60 mL/min (ref >60)
GFR calc non Af Amer: >60 mL/min (ref >60)
GFR calc non Af Amer: >60 mL/min (ref >60)
Glucose, Bld: 113 mg/dL — ABNORMAL HIGH (ref 70–99)
Glucose, Bld: 115 mg/dL — ABNORMAL HIGH (ref 70–99)
Potassium: 3.6 mEq/L (ref 3.5–5.1)
Potassium: 3.6 mEq/L (ref 3.5–5.1)
Sodium: 144 mEq/L (ref 135–145)
Sodium: 146 mEq/L — ABNORMAL HIGH (ref 135–145)
Total Bilirubin: 0.2 mg/dL — ABNORMAL LOW (ref 0.3–1.2)
Total Bilirubin: 0.5 mg/dL (ref 0.3–1.2)
Total Protein: 6.8 g/dL (ref 6.0–8.3)
Total Protein: 7.6 g/dL (ref 6.0–8.3)

## 2010-11-14 LAB — URINALYSIS, ROUTINE W REFLEX MICROSCOPIC
Bilirubin Urine: NEGATIVE
Bilirubin Urine: NEGATIVE
Glucose, UA: NEGATIVE mg/dL
Glucose, UA: NEGATIVE mg/dL
Hgb urine dipstick: NEGATIVE
Ketones, ur: 15 mg/dL — AB
Ketones, ur: NEGATIVE mg/dL
Leukocytes, UA: NEGATIVE
Nitrite: NEGATIVE
Nitrite: NEGATIVE
Protein, ur: NEGATIVE mg/dL
Protein, ur: NEGATIVE mg/dL
Specific Gravity, Urine: 1.018 (ref 1.005–1.030)
Specific Gravity, Urine: 1.024 (ref 1.005–1.030)
Urobilinogen, UA: 0.2 mg/dL (ref 0.0–1.0)
Urobilinogen, UA: 0.2 mg/dL (ref 0.0–1.0)
pH: 6 (ref 5.0–8.0)
pH: 7.5 (ref 5.0–8.0)

## 2010-11-14 LAB — URINE MICROSCOPIC-ADD ON

## 2010-11-14 LAB — CBC
HCT: 31.9 % — ABNORMAL LOW (ref 36.0–46.0)
HCT: 35.3 % — ABNORMAL LOW (ref 36.0–46.0)
Hemoglobin: 10.7 g/dL — ABNORMAL LOW (ref 12.0–15.0)
Hemoglobin: 11.8 g/dL — ABNORMAL LOW (ref 12.0–15.0)
MCHC: 33.5 g/dL (ref 30.0–36.0)
MCHC: 33.5 g/dL (ref 30.0–36.0)
MCV: 79.7 fL (ref 78.0–100.0)
MCV: 80.1 fL (ref 78.0–100.0)
Platelets: 206 10*3/uL (ref 150–400)
Platelets: 221 10*3/uL (ref 150–400)
RBC: 4.01 MIL/uL (ref 3.87–5.11)
RBC: 4.4 MIL/uL (ref 3.87–5.11)
RDW: 15.5 % (ref 11.5–15.5)
RDW: 15.6 % — ABNORMAL HIGH (ref 11.5–15.5)
WBC: 6.2 10*3/uL (ref 4.0–10.5)
WBC: 6.6 10*3/uL (ref 4.0–10.5)

## 2010-11-14 LAB — DIFFERENTIAL
Basophils Absolute: 0 10*3/uL (ref 0.0–0.1)
Basophils Absolute: 0 10*3/uL (ref 0.0–0.1)
Basophils Relative: 1 % (ref 0–1)
Basophils Relative: 1 % (ref 0–1)
Eosinophils Absolute: 0.1 10*3/uL (ref 0.0–0.7)
Eosinophils Absolute: 0.2 10*3/uL (ref 0.0–0.7)
Eosinophils Relative: 2 % (ref 0–5)
Eosinophils Relative: 3 % (ref 0–5)
Lymphocytes Relative: 19 % (ref 12–46)
Lymphocytes Relative: 30 % (ref 12–46)
Lymphs Abs: 1.2 10*3/uL (ref 0.7–4.0)
Lymphs Abs: 2 10*3/uL (ref 0.7–4.0)
Monocytes Absolute: 0.3 10*3/uL (ref 0.1–1.0)
Monocytes Absolute: 0.6 10*3/uL (ref 0.1–1.0)
Monocytes Relative: 10 % (ref 3–12)
Monocytes Relative: 5 % (ref 3–12)
Neutro Abs: 3.8 10*3/uL (ref 1.7–7.7)
Neutro Abs: 4.6 10*3/uL (ref 1.7–7.7)
Neutrophils Relative %: 57 % (ref 43–77)
Neutrophils Relative %: 73 % (ref 43–77)

## 2010-11-14 LAB — PREGNANCY, URINE: Preg Test, Ur: NEGATIVE

## 2010-11-14 LAB — LIPASE, BLOOD
Lipase: 205 U/L (ref 23–300)
Lipase: 207 U/L (ref 23–300)

## 2010-11-15 LAB — CBC
HCT: 35.4 % — ABNORMAL LOW (ref 36.0–46.0)
Hemoglobin: 11.3 g/dL — ABNORMAL LOW (ref 12.0–15.0)
MCHC: 31.9 g/dL (ref 30.0–36.0)
MCV: 81 fL (ref 78.0–100.0)
Platelets: 222 10*3/uL (ref 150–400)
RBC: 4.38 MIL/uL (ref 3.87–5.11)
RDW: 16.6 % — ABNORMAL HIGH (ref 11.5–15.5)
WBC: 5.4 10*3/uL (ref 4.0–10.5)

## 2010-11-15 LAB — COMPREHENSIVE METABOLIC PANEL
ALT: 13 U/L (ref 0–35)
AST: 13 U/L (ref 0–37)
Albumin: 3.7 g/dL (ref 3.5–5.2)
Alkaline Phosphatase: 48 U/L (ref 39–117)
BUN: 11 mg/dL (ref 6–23)
CO2: 22 mEq/L (ref 19–32)
Calcium: 8.5 mg/dL (ref 8.4–10.5)
Chloride: 106 mEq/L (ref 96–112)
Creatinine, Ser: 0.68 mg/dL (ref 0.4–1.2)
GFR calc Af Amer: >60 mL/min (ref >60)
GFR calc non Af Amer: >60 mL/min (ref >60)
Glucose, Bld: 96 mg/dL (ref 70–99)
Potassium: 3.4 mEq/L — ABNORMAL LOW (ref 3.5–5.1)
Sodium: 136 mEq/L (ref 135–145)
Total Bilirubin: 0.2 mg/dL — ABNORMAL LOW (ref 0.3–1.2)
Total Protein: 7 g/dL (ref 6.0–8.3)

## 2010-11-15 LAB — URINALYSIS, ROUTINE W REFLEX MICROSCOPIC
Bilirubin Urine: NEGATIVE
Glucose, UA: NEGATIVE mg/dL
Hgb urine dipstick: NEGATIVE
Ketones, ur: NEGATIVE mg/dL
Nitrite: NEGATIVE
Protein, ur: NEGATIVE mg/dL
Specific Gravity, Urine: 1.025 (ref 1.005–1.030)
Urobilinogen, UA: 0.2 mg/dL (ref 0.0–1.0)
pH: 6.5 (ref 5.0–8.0)

## 2010-11-15 LAB — DIFFERENTIAL
Basophils Absolute: 0 10*3/uL (ref 0.0–0.1)
Basophils Relative: 0 % (ref 0–1)
Eosinophils Absolute: 0.1 10*3/uL (ref 0.0–0.7)
Eosinophils Relative: 2 % (ref 0–5)
Lymphocytes Relative: 21 % (ref 12–46)
Lymphs Abs: 1.1 10*3/uL (ref 0.7–4.0)
Monocytes Absolute: 0.5 10*3/uL (ref 0.1–1.0)
Monocytes Relative: 10 % (ref 3–12)
Neutro Abs: 3.6 10*3/uL (ref 1.7–7.7)
Neutrophils Relative %: 67 % (ref 43–77)

## 2010-11-15 LAB — PREGNANCY, URINE: Preg Test, Ur: NEGATIVE

## 2010-11-15 LAB — LIPASE, BLOOD: Lipase: 36 U/L (ref 11–59)

## 2010-11-16 LAB — POCT CARDIAC MARKERS
CKMB, poc: <1 ng/mL — ABNORMAL LOW (ref 1.0–8.0)
Myoglobin, poc: 37.7 ng/mL (ref 12–200)
Troponin i, poc: <0.05 ng/mL (ref 0.00–0.09)

## 2010-11-16 LAB — URINALYSIS, ROUTINE W REFLEX MICROSCOPIC
Bilirubin Urine: NEGATIVE
Glucose, UA: NEGATIVE mg/dL
Hgb urine dipstick: NEGATIVE
Ketones, ur: NEGATIVE mg/dL
Nitrite: NEGATIVE
Protein, ur: NEGATIVE mg/dL
Specific Gravity, Urine: 1.02 (ref 1.005–1.030)
Urobilinogen, UA: 0.2 mg/dL (ref 0.0–1.0)
pH: 7.5 (ref 5.0–8.0)

## 2010-11-16 LAB — POCT I-STAT, CHEM 8
BUN: 8 mg/dL (ref 6–23)
Calcium, Ion: 1.19 mmol/L (ref 1.12–1.32)
Chloride: 106 mEq/L (ref 96–112)
Creatinine, Ser: 0.5 mg/dL (ref 0.4–1.2)
Glucose, Bld: 92 mg/dL (ref 70–99)
HCT: 35 % — ABNORMAL LOW (ref 36.0–46.0)
Hemoglobin: 11.9 g/dL — ABNORMAL LOW (ref 12.0–15.0)
Potassium: 3.7 mEq/L (ref 3.5–5.1)
Sodium: 142 mEq/L (ref 135–145)
TCO2: 25 mmol/L (ref 0–100)

## 2010-11-17 LAB — BASIC METABOLIC PANEL
BUN: 12 mg/dL (ref 6–23)
CO2: 22 mEq/L (ref 19–32)
Calcium: 9.2 mg/dL (ref 8.4–10.5)
Chloride: 109 mEq/L (ref 96–112)
Creatinine, Ser: 0.7 mg/dL (ref 0.4–1.2)
GFR calc Af Amer: >60 mL/min (ref >60)
GFR calc non Af Amer: >60 mL/min (ref >60)
Glucose, Bld: 97 mg/dL (ref 70–99)
Potassium: 3.8 mEq/L (ref 3.5–5.1)
Sodium: 143 mEq/L (ref 135–145)

## 2010-11-17 LAB — DIFFERENTIAL
Basophils Absolute: 0 10*3/uL (ref 0.0–0.1)
Basophils Absolute: 0 10*3/uL (ref 0.0–0.1)
Basophils Relative: 1 % (ref 0–1)
Basophils Relative: 1 % (ref 0–1)
Eosinophils Absolute: 0.1 10*3/uL (ref 0.0–0.7)
Eosinophils Absolute: 0.1 10*3/uL (ref 0.0–0.7)
Eosinophils Relative: 2 % (ref 0–5)
Eosinophils Relative: 2 % (ref 0–5)
Lymphocytes Relative: 30 % (ref 12–46)
Lymphocytes Relative: 28 % (ref 12–46)
Lymphs Abs: 1.5 10*3/uL (ref 0.7–4.0)
Lymphs Abs: 1.2 10*3/uL (ref 0.7–4.0)
Monocytes Absolute: 0.6 10*3/uL (ref 0.1–1.0)
Monocytes Absolute: 0.4 10*3/uL (ref 0.1–1.0)
Monocytes Relative: 11 % (ref 3–12)
Monocytes Relative: 10 % (ref 3–12)
Neutro Abs: 2.9 10*3/uL (ref 1.7–7.7)
Neutro Abs: 2.5 10*3/uL (ref 1.7–7.7)
Neutrophils Relative %: 56 % (ref 43–77)
Neutrophils Relative %: 59 % (ref 43–77)

## 2010-11-17 LAB — AMYLASE: Amylase: 90 U/L (ref 27–131)

## 2010-11-17 LAB — POCT CARDIAC MARKERS
CKMB, poc: <1 ng/mL — ABNORMAL LOW (ref 1.0–8.0)
CKMB, poc: <1 ng/mL — ABNORMAL LOW (ref 1.0–8.0)
CKMB, poc: <1 ng/mL — ABNORMAL LOW (ref 1.0–8.0)
CKMB, poc: <1 ng/mL — ABNORMAL LOW (ref 1.0–8.0)
Myoglobin, poc: 21 ng/mL (ref 12–200)
Myoglobin, poc: 23.7 ng/mL (ref 12–200)
Myoglobin, poc: 33.4 ng/mL (ref 12–200)
Myoglobin, poc: 50.1 ng/mL (ref 12–200)
Troponin i, poc: <0.05 ng/mL (ref 0.00–0.09)
Troponin i, poc: <0.05 ng/mL (ref 0.00–0.09)
Troponin i, poc: <0.05 ng/mL (ref 0.00–0.09)
Troponin i, poc: <0.05 ng/mL (ref 0.00–0.09)

## 2010-11-17 LAB — POCT I-STAT, CHEM 8
BUN: 14 mg/dL (ref 6–23)
Calcium, Ion: 1.17 mmol/L (ref 1.12–1.32)
Chloride: 107 mEq/L (ref 96–112)
Creatinine, Ser: 0.4 mg/dL (ref 0.4–1.2)
Glucose, Bld: 127 mg/dL — ABNORMAL HIGH (ref 70–99)
HCT: 32 % — ABNORMAL LOW (ref 36.0–46.0)
Hemoglobin: 10.9 g/dL — ABNORMAL LOW (ref 12.0–15.0)
Potassium: 3.5 mEq/L (ref 3.5–5.1)
Sodium: 141 mEq/L (ref 135–145)
TCO2: 23 mmol/L (ref 0–100)

## 2010-11-17 LAB — PROTIME-INR
INR: 0.96 (ref 0.00–1.49)
Prothrombin Time: 12.7 seconds (ref 11.6–15.2)

## 2010-11-17 LAB — CBC
HCT: 31 % — ABNORMAL LOW (ref 36.0–46.0)
HCT: 33.5 % — ABNORMAL LOW (ref 36.0–46.0)
Hemoglobin: 10.1 g/dL — ABNORMAL LOW (ref 12.0–15.0)
Hemoglobin: 10.9 g/dL — ABNORMAL LOW (ref 12.0–15.0)
MCHC: 32.7 g/dL (ref 30.0–36.0)
MCHC: 32.4 g/dL (ref 30.0–36.0)
MCV: 80.9 fL (ref 78.0–100.0)
MCV: 81 fL (ref 78.0–100.0)
Platelets: 202 10*3/uL (ref 150–400)
Platelets: 200 10*3/uL (ref 150–400)
RBC: 3.84 MIL/uL — ABNORMAL LOW (ref 3.87–5.11)
RBC: 4.14 MIL/uL (ref 3.87–5.11)
RDW: 16.4 % — ABNORMAL HIGH (ref 11.5–15.5)
RDW: 15.4 % (ref 11.5–15.5)
WBC: 5.2 10*3/uL (ref 4.0–10.5)
WBC: 4.2 10*3/uL (ref 4.0–10.5)

## 2010-11-17 LAB — HEPATIC FUNCTION PANEL
ALT: 16 U/L (ref 0–35)
AST: 19 U/L (ref 0–37)
Albumin: 3.5 g/dL (ref 3.5–5.2)
Alkaline Phosphatase: 42 U/L (ref 39–117)
Bilirubin, Direct: 0.1 mg/dL (ref 0.0–0.3)
Indirect Bilirubin: 0.3 mg/dL (ref 0.3–0.9)
Total Bilirubin: 0.4 mg/dL (ref 0.3–1.2)
Total Protein: 6.5 g/dL (ref 6.0–8.3)

## 2010-11-17 LAB — PREGNANCY, URINE: Preg Test, Ur: NEGATIVE

## 2010-11-17 LAB — RAPID STREP SCREEN (MED CTR MEBANE ONLY): Streptococcus, Group A Screen (Direct): NEGATIVE

## 2010-11-17 LAB — LIPASE, BLOOD: Lipase: 39 U/L (ref 11–59)

## 2010-11-18 ENCOUNTER — Ambulatory Visit
Admission: RE | Admit: 2010-11-18 | Discharge: 2010-11-18 | Disposition: A | Discharge status: Home / Self Care | Payer: Medicare Other | Source: Ambulatory Visit | Attending: Emergency Medicine | Admitting: Emergency Medicine

## 2010-11-18 ENCOUNTER — Inpatient Hospital Stay (INDEPENDENT_AMBULATORY_CARE_PROVIDER_SITE_OTHER)
Admission: RE | Admit: 2010-11-18 | Discharge: 2010-11-18 | Disposition: A | Discharge status: Home / Self Care | Payer: Medicare Other | Source: Ambulatory Visit | Attending: Emergency Medicine | Admitting: Emergency Medicine

## 2010-11-18 ENCOUNTER — Other Ambulatory Visit: Payer: Self-pay | Admitting: Emergency Medicine

## 2010-11-18 ENCOUNTER — Encounter: Payer: Self-pay | Admitting: Emergency Medicine

## 2010-11-18 DIAGNOSIS — R0602 Shortness of breath: Secondary | ICD-10-CM

## 2010-11-18 DIAGNOSIS — R079 Chest pain, unspecified: Secondary | ICD-10-CM

## 2010-11-18 LAB — DIFFERENTIAL
Basophils Absolute: 0.2 10*3/uL — ABNORMAL HIGH (ref 0.0–0.1)
Basophils Relative: 4 % — ABNORMAL HIGH (ref 0–1)
Eosinophils Absolute: 0.2 10*3/uL (ref 0.0–0.7)
Eosinophils Relative: 3 % (ref 0–5)
Lymphocytes Relative: 22 % (ref 12–46)
Lymphs Abs: 1.3 10*3/uL (ref 0.7–4.0)
Monocytes Absolute: 0.7 10*3/uL (ref 0.1–1.0)
Monocytes Relative: 12 % (ref 3–12)
Neutro Abs: 3.5 10*3/uL (ref 1.7–7.7)
Neutrophils Relative %: 60 % (ref 43–77)

## 2010-11-18 LAB — URINE MICROSCOPIC-ADD ON

## 2010-11-18 LAB — URINALYSIS, ROUTINE W REFLEX MICROSCOPIC
Bilirubin Urine: NEGATIVE
Glucose, UA: NEGATIVE mg/dL
Hgb urine dipstick: NEGATIVE
Ketones, ur: NEGATIVE mg/dL
Leukocytes, UA: NEGATIVE
Nitrite: NEGATIVE
Protein, ur: NEGATIVE mg/dL
Specific Gravity, Urine: 1.016 (ref 1.005–1.030)
Urobilinogen, UA: 0.2 mg/dL (ref 0.0–1.0)
pH: 7.5 (ref 5.0–8.0)

## 2010-11-18 LAB — COMPREHENSIVE METABOLIC PANEL
ALT: 11 U/L (ref 0–35)
AST: 22 U/L (ref 0–37)
Albumin: 4.1 g/dL (ref 3.5–5.2)
Alkaline Phosphatase: 56 U/L (ref 39–117)
BUN: 12 mg/dL (ref 6–23)
CO2: 27 mEq/L (ref 19–32)
Calcium: 9.6 mg/dL (ref 8.4–10.5)
Chloride: 108 mEq/L (ref 96–112)
Creatinine, Ser: 0.6 mg/dL (ref 0.4–1.2)
GFR calc Af Amer: >60 mL/min (ref >60)
GFR calc non Af Amer: >60 mL/min (ref >60)
Glucose, Bld: 91 mg/dL (ref 70–99)
Potassium: 3.8 mEq/L (ref 3.5–5.1)
Sodium: 143 mEq/L (ref 135–145)
Total Bilirubin: 0.2 mg/dL — ABNORMAL LOW (ref 0.3–1.2)
Total Protein: 7.5 g/dL (ref 6.0–8.3)

## 2010-11-18 LAB — CBC
HCT: 33.5 % — ABNORMAL LOW (ref 36.0–46.0)
Hemoglobin: 11.3 g/dL — ABNORMAL LOW (ref 12.0–15.0)
MCHC: 33.8 g/dL (ref 30.0–36.0)
MCV: 80.4 fL (ref 78.0–100.0)
Platelets: 173 10*3/uL (ref 150–400)
RBC: 4.17 MIL/uL (ref 3.87–5.11)
RDW: 15.3 % (ref 11.5–15.5)
WBC: 5.9 10*3/uL (ref 4.0–10.5)

## 2010-11-18 LAB — CONVERTED CEMR LAB
Inflenza A Ag: NEGATIVE
Influenza B Ag: NEGATIVE
Rapid Strep: NEGATIVE

## 2010-11-18 LAB — POCT CARDIAC MARKERS
CKMB, poc: <1 ng/mL — ABNORMAL LOW (ref 1.0–8.0)
CKMB, poc: <1 ng/mL — ABNORMAL LOW (ref 1.0–8.0)
Myoglobin, poc: 26.4 ng/mL (ref 12–200)
Myoglobin, poc: 29.8 ng/mL (ref 12–200)
Troponin i, poc: <0.05 ng/mL (ref 0.00–0.09)
Troponin i, poc: <0.05 ng/mL (ref 0.00–0.09)

## 2010-11-18 LAB — PREGNANCY, URINE: Preg Test, Ur: NEGATIVE

## 2010-11-19 LAB — URINALYSIS, ROUTINE W REFLEX MICROSCOPIC
Bilirubin Urine: NEGATIVE
Glucose, UA: NEGATIVE mg/dL
Hgb urine dipstick: NEGATIVE
Ketones, ur: NEGATIVE mg/dL
Nitrite: NEGATIVE
Protein, ur: NEGATIVE mg/dL
Specific Gravity, Urine: 1.024 (ref 1.005–1.030)
Urobilinogen, UA: 0.2 mg/dL (ref 0.0–1.0)
pH: 6 (ref 5.0–8.0)

## 2010-11-20 LAB — CBC
HCT: 32.8 % — ABNORMAL LOW (ref 36.0–46.0)
Hemoglobin: 10.5 g/dL — ABNORMAL LOW (ref 12.0–15.0)
MCHC: 32.1 g/dL (ref 30.0–36.0)
MCV: 81 fL (ref 78.0–100.0)
Platelets: 208 10*3/uL (ref 150–400)
RBC: 4.05 MIL/uL (ref 3.87–5.11)
RDW: 17.3 % — ABNORMAL HIGH (ref 11.5–15.5)
WBC: 5.9 10*3/uL (ref 4.0–10.5)

## 2010-11-20 LAB — URINALYSIS, ROUTINE W REFLEX MICROSCOPIC
Bilirubin Urine: NEGATIVE
Glucose, UA: NEGATIVE mg/dL
Hgb urine dipstick: NEGATIVE
Ketones, ur: NEGATIVE mg/dL
Nitrite: NEGATIVE
Protein, ur: NEGATIVE mg/dL
Specific Gravity, Urine: 1.029 (ref 1.005–1.030)
Urobilinogen, UA: 0.2 mg/dL (ref 0.0–1.0)
pH: 6.5 (ref 5.0–8.0)

## 2010-11-20 LAB — DIFFERENTIAL
Basophils Absolute: 0.1 10*3/uL (ref 0.0–0.1)
Basophils Relative: 1 % (ref 0–1)
Eosinophils Absolute: 0.1 10*3/uL (ref 0.0–0.7)
Eosinophils Relative: 2 % (ref 0–5)
Lymphocytes Relative: 25 % (ref 12–46)
Lymphs Abs: 1.5 10*3/uL (ref 0.7–4.0)
Monocytes Absolute: 0.5 10*3/uL (ref 0.1–1.0)
Monocytes Relative: 9 % (ref 3–12)
Neutro Abs: 3.8 10*3/uL (ref 1.7–7.7)
Neutrophils Relative %: 64 % (ref 43–77)

## 2010-11-20 LAB — COMPREHENSIVE METABOLIC PANEL
ALT: 19 U/L (ref 0–35)
AST: 16 U/L (ref 0–37)
Albumin: 3.7 g/dL (ref 3.5–5.2)
Alkaline Phosphatase: 52 U/L (ref 39–117)
BUN: 14 mg/dL (ref 6–23)
CO2: 26 mEq/L (ref 19–32)
Calcium: 9.2 mg/dL (ref 8.4–10.5)
Chloride: 109 mEq/L (ref 96–112)
Creatinine, Ser: 0.52 mg/dL (ref 0.4–1.2)
GFR calc Af Amer: >60 mL/min (ref >60)
GFR calc non Af Amer: >60 mL/min (ref >60)
Glucose, Bld: 97 mg/dL (ref 70–99)
Potassium: 3.7 mEq/L (ref 3.5–5.1)
Sodium: 142 mEq/L (ref 135–145)
Total Bilirubin: 0.4 mg/dL (ref 0.3–1.2)
Total Protein: 6.6 g/dL (ref 6.0–8.3)

## 2010-11-20 LAB — PREGNANCY, URINE: Preg Test, Ur: NEGATIVE

## 2010-11-20 LAB — RAPID STREP SCREEN (MED CTR MEBANE ONLY): Streptococcus, Group A Screen (Direct): NEGATIVE

## 2010-11-20 LAB — LIPASE, BLOOD: Lipase: 42 U/L (ref 11–59)

## 2010-11-20 IMAGING — CR DG SHOULDER 2+V*L*
3 series · 3 of 3 positions shown · non-contrast
Comparison: 09/15/2008

CLINICAL DATA: Fall 1 week ago.  Left shoulder pain.

LEFT SHOULDER - 2+ VIEW

[w shoulder ap external left (1 of 2)]
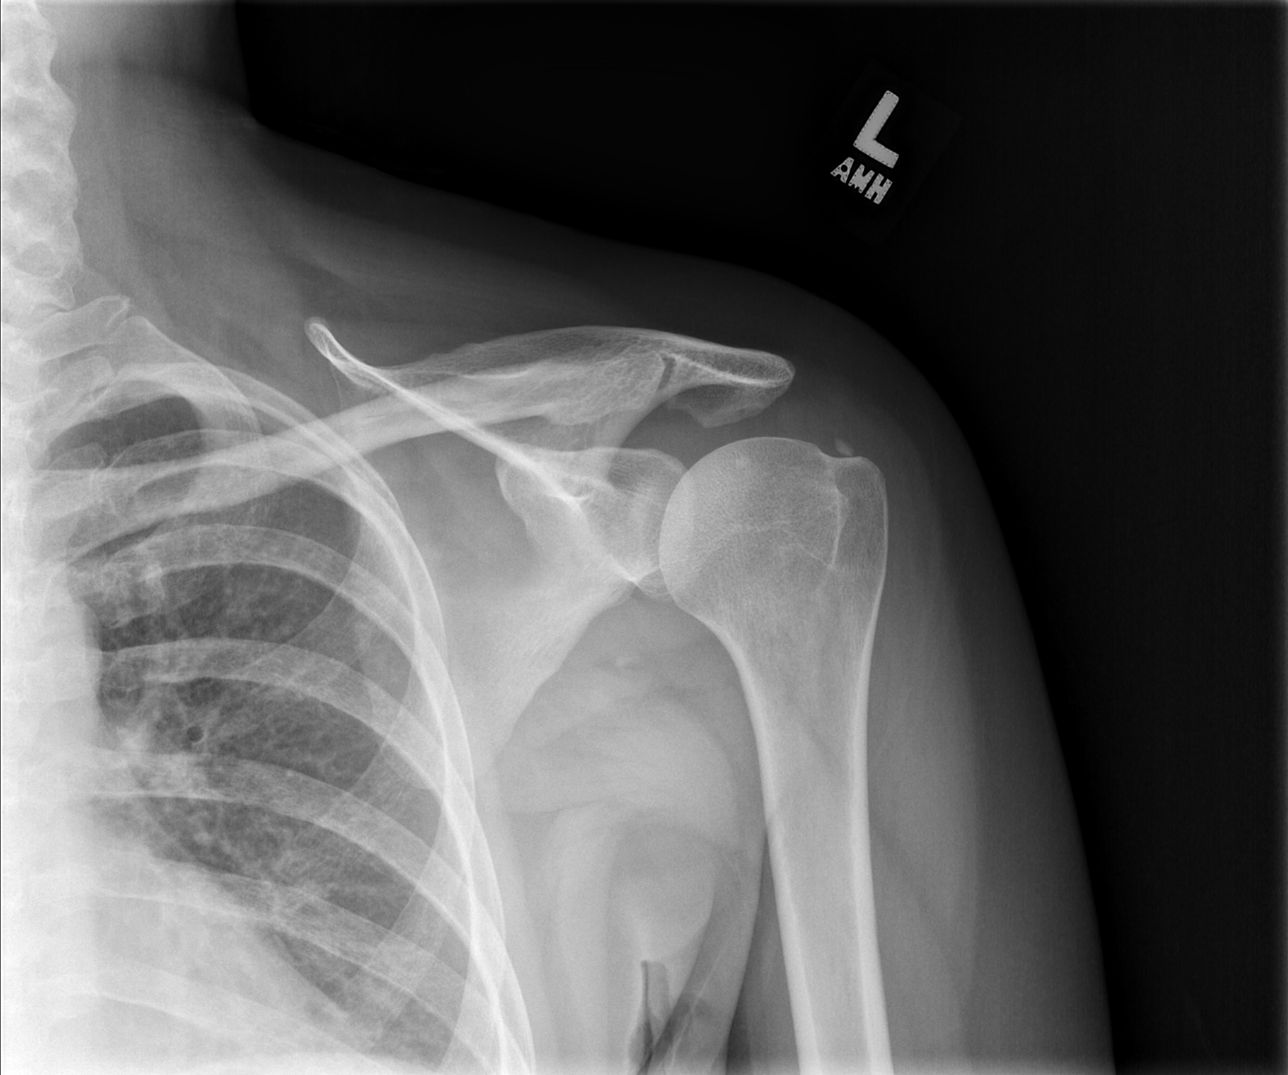

[w shoulder ap external left (2 of 2)]
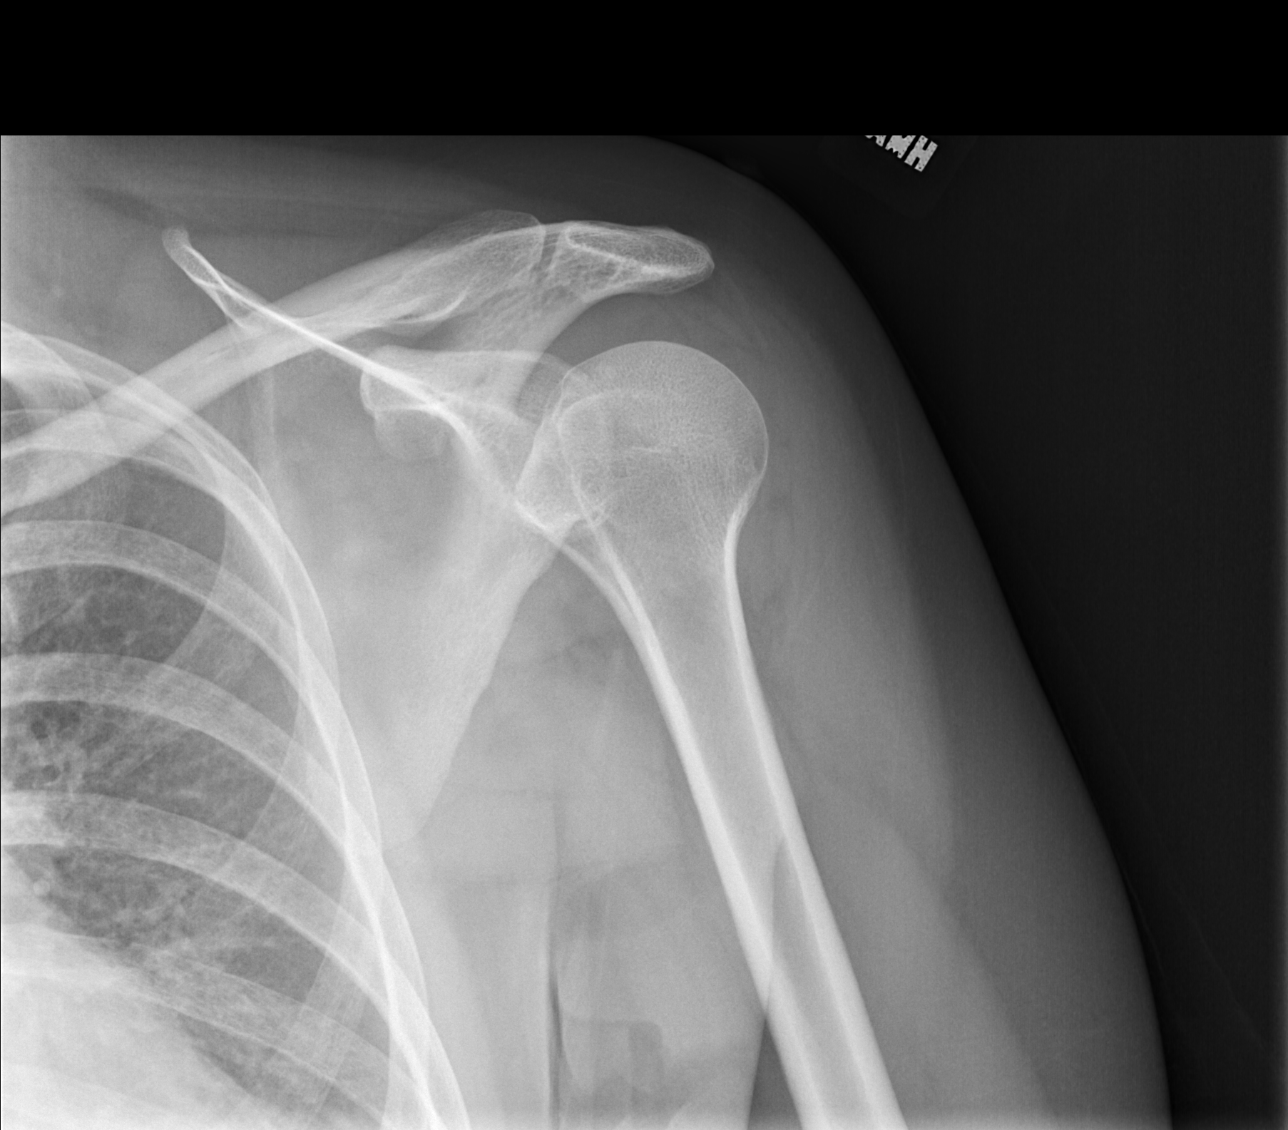

[x shoulder axillary left]
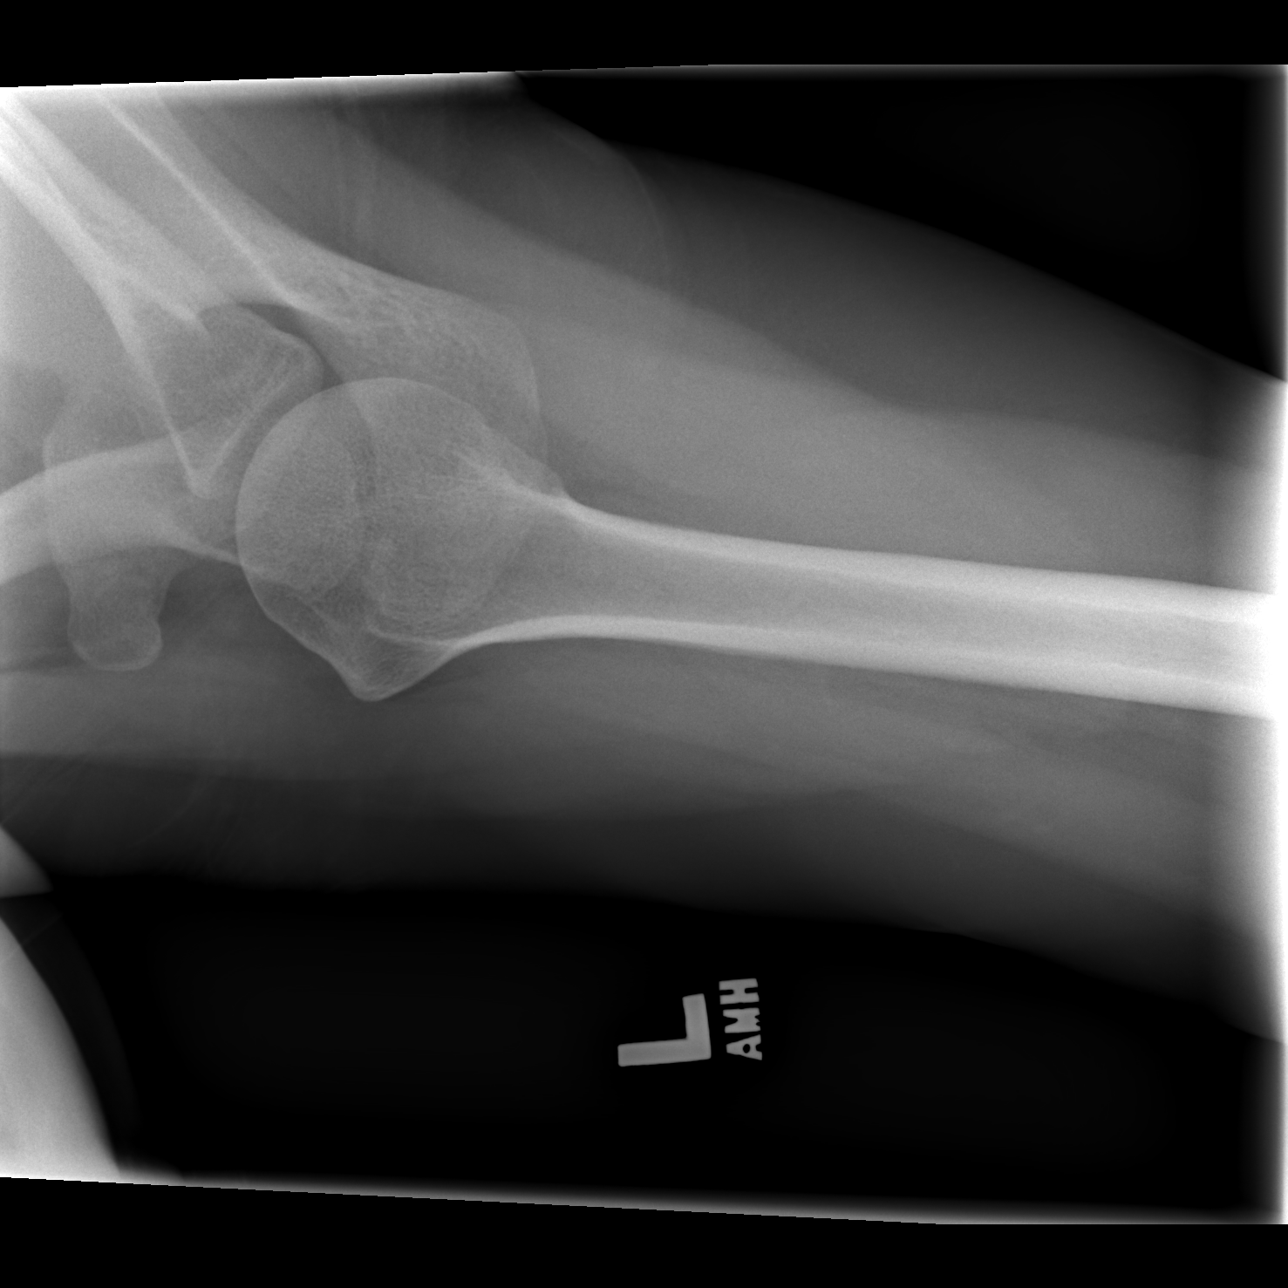

[3 of 3 positions shown; findings below may reference images not displayed]

FINDINGS: Calcification projecting over the expected location of
the supraspinatus tendon raise the possibility of calcific
tendinopathy.

Acromioclavicular alignment appears normal.  No fracture or
dislocation identified.  No acute radiographic findings.
IMPRESSION: 1.  Suspected calcific tendinopathy of the rotator cuff.
2.  No acute findings.

## 2010-11-21 ENCOUNTER — Emergency Department (HOSPITAL_BASED_OUTPATIENT_CLINIC_OR_DEPARTMENT_OTHER)
Admission: EM | Admit: 2010-11-21 | Discharge: 2010-11-21 | Disposition: A | Discharge status: Home / Self Care | Payer: Medicare Other | Attending: Emergency Medicine | Admitting: Emergency Medicine

## 2010-11-21 ENCOUNTER — Telehealth: Payer: Self-pay | Admitting: Family Medicine

## 2010-11-21 DIAGNOSIS — J3489 Other specified disorders of nose and nasal sinuses: Secondary | ICD-10-CM | POA: Insufficient documentation

## 2010-11-21 DIAGNOSIS — G8929 Other chronic pain: Secondary | ICD-10-CM | POA: Insufficient documentation

## 2010-11-21 DIAGNOSIS — I10 Essential (primary) hypertension: Secondary | ICD-10-CM | POA: Insufficient documentation

## 2010-11-21 DIAGNOSIS — Z79899 Other long term (current) drug therapy: Secondary | ICD-10-CM | POA: Insufficient documentation

## 2010-11-21 LAB — PREGNANCY, URINE
Preg Test, Ur: NEGATIVE
Preg Test, Ur: NEGATIVE

## 2010-11-21 LAB — URINALYSIS, ROUTINE W REFLEX MICROSCOPIC
Bilirubin Urine: NEGATIVE
Glucose, UA: NEGATIVE mg/dL
Hgb urine dipstick: NEGATIVE
Ketones, ur: NEGATIVE mg/dL
Nitrite: NEGATIVE
Protein, ur: NEGATIVE mg/dL
Specific Gravity, Urine: 1.031 — ABNORMAL HIGH (ref 1.005–1.030)
Urobilinogen, UA: 0.2 mg/dL (ref 0.0–1.0)
pH: 5.5 (ref 5.0–8.0)

## 2010-11-21 LAB — HEMOCCULT GUIAC POC 1CARD (OFFICE): Fecal Occult Bld: POSITIVE

## 2010-11-21 NOTE — Telephone Encounter (Signed)
Patient called very upset because she states she is having surgery tomorrow and her Sinus's are really bad and she is wondering if Dr. Linford Arnold will order a CT of her Sinuses TODAY? Pt. States she has already been on rounds of antibiotics and was seen in Urgent Care last week. Call Patient back today at [phone]

## 2010-11-21 NOTE — Telephone Encounter (Signed)
No needs to be seen as the CT will need prior auth. Call her surgeon ans see if he wants to keep her on ABX during and after her surgery.

## 2010-11-21 NOTE — Telephone Encounter (Signed)
Called and left message on number left by pt with dr. Linford Arnold advise

## 2010-11-22 LAB — BASIC METABOLIC PANEL
BUN: 12 mg/dL (ref 6–23)
CO2: 26 mEq/L (ref 19–32)
Calcium: 9.2 mg/dL (ref 8.4–10.5)
Chloride: 107 mEq/L (ref 96–112)
Creatinine, Ser: 0.6 mg/dL (ref 0.4–1.2)
GFR calc Af Amer: >60 mL/min (ref >60)
GFR calc non Af Amer: >60 mL/min (ref >60)
Glucose, Bld: 87 mg/dL (ref 70–99)
Potassium: 3.7 mEq/L (ref 3.5–5.1)
Sodium: 142 mEq/L (ref 135–145)

## 2010-11-22 LAB — CBC
HCT: 32.3 % — ABNORMAL LOW (ref 36.0–46.0)
Hemoglobin: 10.5 g/dL — ABNORMAL LOW (ref 12.0–15.0)
MCHC: 32.6 g/dL (ref 30.0–36.0)
MCV: 80.7 fL (ref 78.0–100.0)
Platelets: 209 10*3/uL (ref 150–400)
RBC: 4 MIL/uL (ref 3.87–5.11)
RDW: 16.1 % — ABNORMAL HIGH (ref 11.5–15.5)
WBC: 5.2 10*3/uL (ref 4.0–10.5)

## 2010-11-22 LAB — DIFFERENTIAL
Basophils Absolute: 0 10*3/uL (ref 0.0–0.1)
Basophils Relative: 1 % (ref 0–1)
Eosinophils Absolute: 0.1 10*3/uL (ref 0.0–0.7)
Eosinophils Relative: 2 % (ref 0–5)
Lymphocytes Relative: 29 % (ref 12–46)
Lymphs Abs: 1.5 10*3/uL (ref 0.7–4.0)
Monocytes Absolute: 0.5 10*3/uL (ref 0.1–1.0)
Monocytes Relative: 10 % (ref 3–12)
Neutro Abs: 3.1 10*3/uL (ref 1.7–7.7)
Neutrophils Relative %: 58 % (ref 43–77)

## 2010-11-23 LAB — COMPREHENSIVE METABOLIC PANEL
ALT: 25 U/L (ref 0–35)
AST: 21 U/L (ref 0–37)
Albumin: 3.7 g/dL (ref 3.5–5.2)
Alkaline Phosphatase: 48 U/L (ref 39–117)
BUN: 8 mg/dL (ref 6–23)
CO2: 24 mEq/L (ref 19–32)
Calcium: 8.7 mg/dL (ref 8.4–10.5)
Chloride: 106 mEq/L (ref 96–112)
Creatinine, Ser: 0.58 mg/dL (ref 0.4–1.2)
GFR calc Af Amer: >60 mL/min (ref >60)
GFR calc non Af Amer: >60 mL/min (ref >60)
Glucose, Bld: 99 mg/dL (ref 70–99)
Potassium: 3.6 mEq/L (ref 3.5–5.1)
Sodium: 137 mEq/L (ref 135–145)
Total Bilirubin: 0.5 mg/dL (ref 0.3–1.2)
Total Protein: 6.7 g/dL (ref 6.0–8.3)

## 2010-11-23 LAB — URINE CULTURE
Colony Count: NO GROWTH
Culture: NO GROWTH

## 2010-11-23 LAB — DIFFERENTIAL
Basophils Absolute: 0 10*3/uL (ref 0.0–0.1)
Basophils Relative: 1 % (ref 0–1)
Eosinophils Absolute: 0.1 10*3/uL (ref 0.0–0.7)
Eosinophils Relative: 2 % (ref 0–5)
Lymphocytes Relative: 28 % (ref 12–46)
Lymphs Abs: 1.2 10*3/uL (ref 0.7–4.0)
Monocytes Absolute: 0.4 10*3/uL (ref 0.1–1.0)
Monocytes Relative: 9 % (ref 3–12)
Neutro Abs: 2.5 10*3/uL (ref 1.7–7.7)
Neutrophils Relative %: 60 % (ref 43–77)

## 2010-11-23 LAB — CBC
HCT: 35 % — ABNORMAL LOW (ref 36.0–46.0)
Hemoglobin: 11.4 g/dL — ABNORMAL LOW (ref 12.0–15.0)
MCHC: 32.7 g/dL (ref 30.0–36.0)
MCV: 79.2 fL (ref 78.0–100.0)
Platelets: 193 10*3/uL (ref 150–400)
RBC: 4.42 MIL/uL (ref 3.87–5.11)
RDW: 15.3 % (ref 11.5–15.5)
WBC: 4.3 10*3/uL (ref 4.0–10.5)

## 2010-11-23 LAB — POCT CARDIAC MARKERS
CKMB, poc: <1 ng/mL — ABNORMAL LOW (ref 1.0–8.0)
CKMB, poc: <1 ng/mL — ABNORMAL LOW (ref 1.0–8.0)
Myoglobin, poc: 36.6 ng/mL (ref 12–200)
Myoglobin, poc: 38.7 ng/mL (ref 12–200)
Troponin i, poc: <0.05 ng/mL (ref 0.00–0.09)
Troponin i, poc: <0.05 ng/mL (ref 0.00–0.09)

## 2010-11-23 LAB — POCT I-STAT, CHEM 8
BUN: 10 mg/dL (ref 6–23)
Calcium, Ion: 1.11 mmol/L — ABNORMAL LOW (ref 1.12–1.32)
Chloride: 105 mEq/L (ref 96–112)
Creatinine, Ser: 0.7 mg/dL (ref 0.4–1.2)
Glucose, Bld: 93 mg/dL (ref 70–99)
HCT: 36 % (ref 36.0–46.0)
Hemoglobin: 12.2 g/dL (ref 12.0–15.0)
Potassium: 3.6 mEq/L (ref 3.5–5.1)
Sodium: 139 mEq/L (ref 135–145)
TCO2: 23 mmol/L (ref 0–100)

## 2010-11-23 LAB — URINALYSIS, ROUTINE W REFLEX MICROSCOPIC
Bilirubin Urine: NEGATIVE
Glucose, UA: NEGATIVE mg/dL
Hgb urine dipstick: NEGATIVE
Ketones, ur: NEGATIVE mg/dL
Nitrite: NEGATIVE
Protein, ur: NEGATIVE mg/dL
Specific Gravity, Urine: 1.024 (ref 1.005–1.030)
Urobilinogen, UA: 0.2 mg/dL (ref 0.0–1.0)
pH: 6 (ref 5.0–8.0)

## 2010-11-23 LAB — POCT PREGNANCY, URINE: Preg Test, Ur: NEGATIVE

## 2010-11-25 ENCOUNTER — Telehealth: Payer: Self-pay | Admitting: Family Medicine

## 2010-11-25 NOTE — Telephone Encounter (Signed)
Jennifer [name] with the cancer center called need status on upper respiratory. Jennifer [name] request a call back about this patient.

## 2010-11-26 IMAGING — CR DG CERVICAL SPINE COMPLETE 4+V
6 series · 6 of 6 positions shown · non-contrast
Comparison: 09/18/2008

CLINICAL DATA: Left shoulder pain.  Left neck pain.

CERVICAL SPINE - COMPLETE 4+ VIEW

[w c-spine lat]
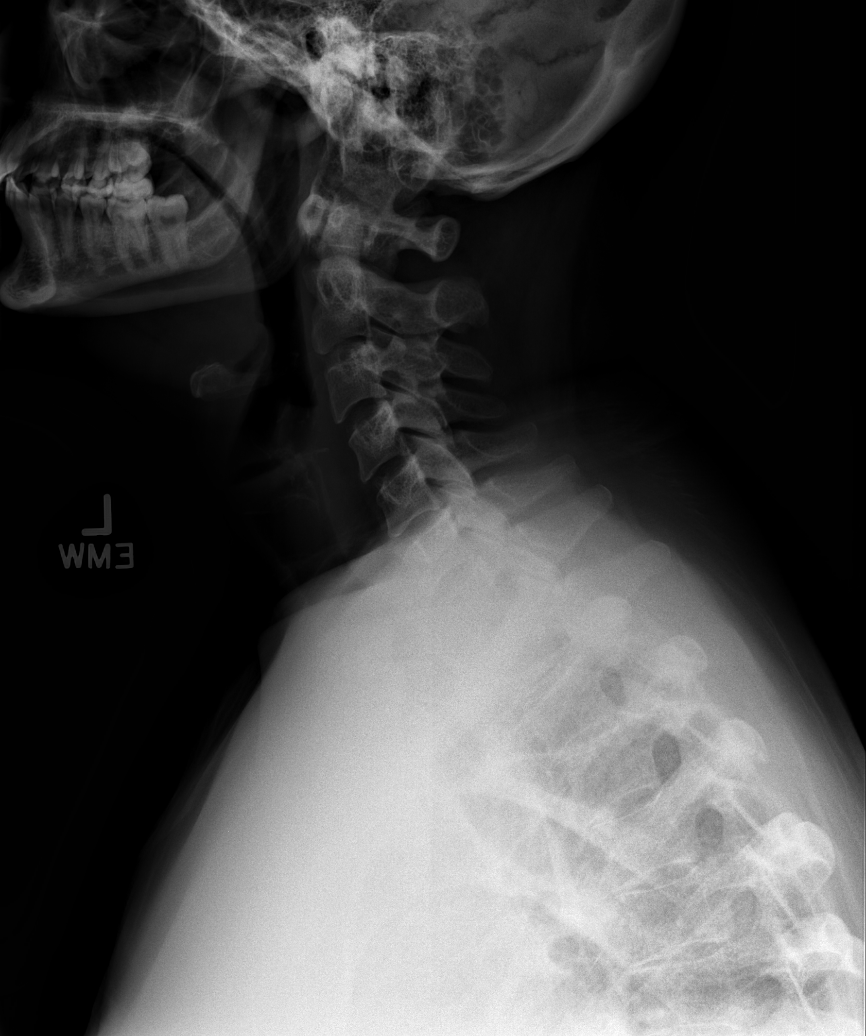

[w c-spine oblique (1 of 2)]
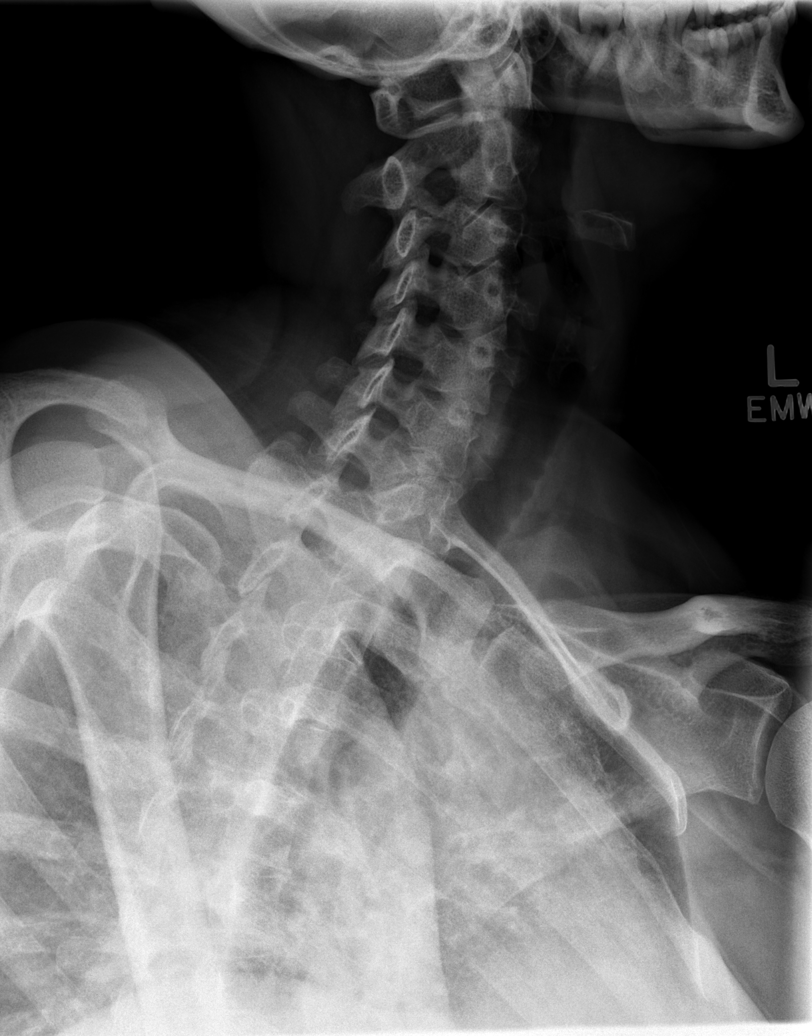

[w c-spine oblique (2 of 2)]
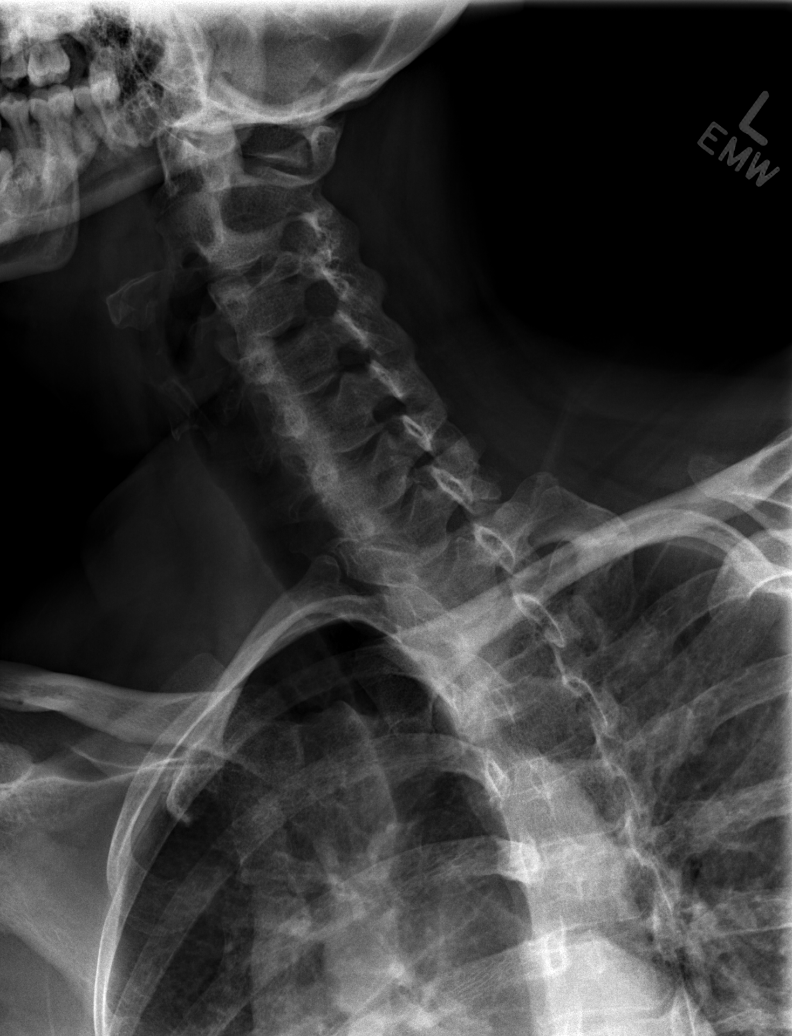

[w c-spine a.p.]
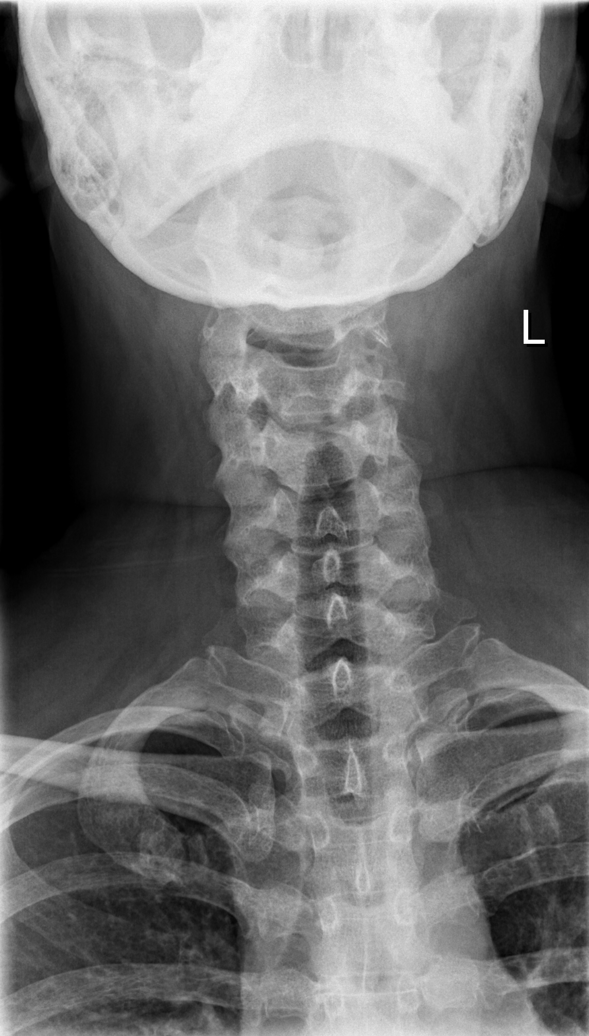

[w c-spine odontoid]
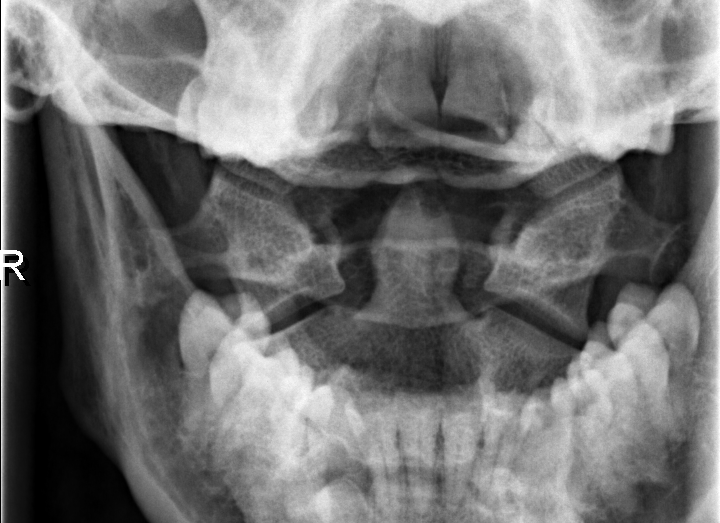

[w swimmers view]
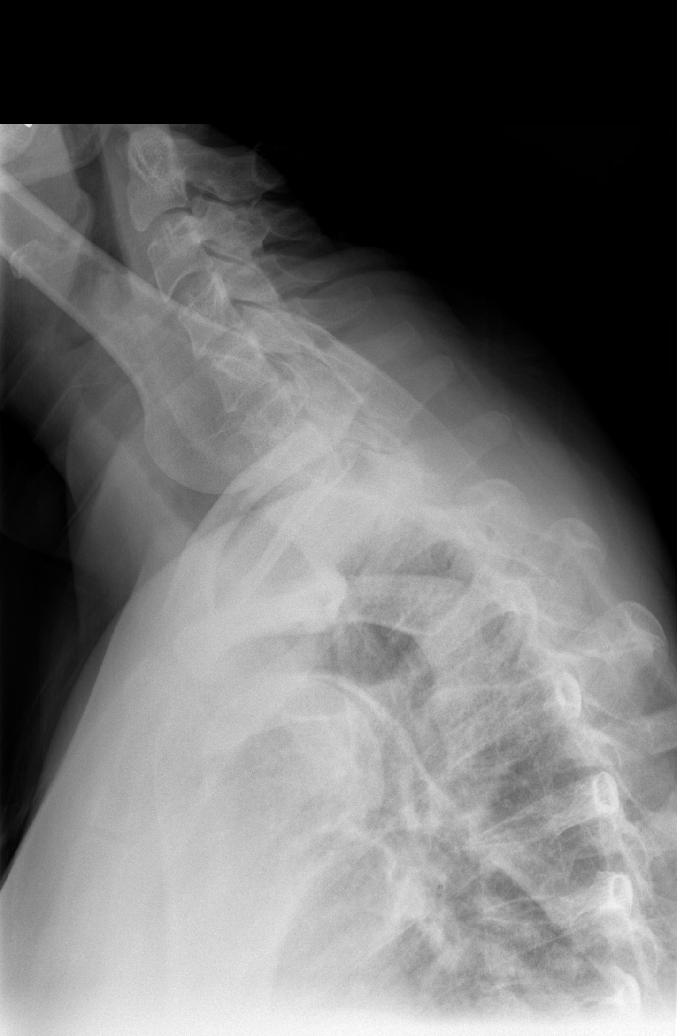

[6 of 6 positions shown; findings below may reference images not displayed]

FINDINGS: The C7 vertebra and cervicothoracic junction are not well
seen on the lateral projection despite swimmer's view.

No malalignment is identified down to the C7 level.  No
prevertebral soft tissue swelling is evident.  No fracture of the
visualized cervical spine is evident.  The lateral masses of C1
appear unremarkable.  No osseous foraminal narrowing is identified.
IMPRESSION: 1.  No discrete cervical spine abnormality is identified.  Please
note that the C7 vertebra and cervicothoracic junction are
suboptimally seen due to shoulder positioning, despite swimmer's
view attempt.

## 2010-11-28 LAB — POCT I-STAT, CHEM 8
BUN: 14 mg/dL (ref 6–23)
Calcium, Ion: 1.12 mmol/L (ref 1.12–1.32)
Chloride: 106 mEq/L (ref 96–112)
Creatinine, Ser: 0.7 mg/dL (ref 0.4–1.2)
Glucose, Bld: 84 mg/dL (ref 70–99)
HCT: 37 % (ref 36.0–46.0)
Hemoglobin: 12.6 g/dL (ref 12.0–15.0)
Potassium: 3.4 mEq/L — ABNORMAL LOW (ref 3.5–5.1)
Sodium: 140 mEq/L (ref 135–145)
TCO2: 23 mmol/L (ref 0–100)

## 2010-11-28 LAB — COMPREHENSIVE METABOLIC PANEL
ALT: 15 U/L (ref 0–35)
AST: 16 U/L (ref 0–37)
Albumin: 4.1 g/dL (ref 3.5–5.2)
Alkaline Phosphatase: 61 U/L (ref 39–117)
BUN: 13 mg/dL (ref 6–23)
CO2: 27 mEq/L (ref 19–32)
Calcium: 9.4 mg/dL (ref 8.4–10.5)
Chloride: 106 mEq/L (ref 96–112)
Creatinine, Ser: 0.6 mg/dL (ref 0.4–1.2)
GFR calc Af Amer: >60 mL/min (ref >60)
GFR calc non Af Amer: >60 mL/min (ref >60)
Glucose, Bld: 110 mg/dL — ABNORMAL HIGH (ref 70–99)
Potassium: 3.7 mEq/L (ref 3.5–5.1)
Sodium: 143 mEq/L (ref 135–145)
Total Bilirubin: 0.3 mg/dL (ref 0.3–1.2)
Total Protein: 7.2 g/dL (ref 6.0–8.3)

## 2010-11-28 LAB — DIFFERENTIAL
Basophils Absolute: 0 10*3/uL (ref 0.0–0.1)
Basophils Relative: 1 % (ref 0–1)
Eosinophils Absolute: 0.1 10*3/uL (ref 0.0–0.7)
Eosinophils Relative: 3 % (ref 0–5)
Lymphocytes Relative: 24 % (ref 12–46)
Lymphs Abs: 1.4 10*3/uL (ref 0.7–4.0)
Monocytes Absolute: 0.6 10*3/uL (ref 0.1–1.0)
Monocytes Relative: 11 % (ref 3–12)
Neutro Abs: 3.8 10*3/uL (ref 1.7–7.7)
Neutrophils Relative %: 63 % (ref 43–77)

## 2010-11-28 LAB — POCT I-STAT 3, ART BLOOD GAS (G3+)
Acid-Base Excess: 1 mmol/L (ref 0.0–2.0)
Bicarbonate: 24.2 mEq/L — ABNORMAL HIGH (ref 20.0–24.0)
O2 Saturation: 99 %
Patient temperature: 37
TCO2: 25 mmol/L (ref 0–100)
pCO2 arterial: 33 mmHg — ABNORMAL LOW (ref 35.0–45.0)
pH, Arterial: 7.473 — ABNORMAL HIGH (ref 7.350–7.400)
pO2, Arterial: 112 mmHg — ABNORMAL HIGH (ref 80.0–100.0)

## 2010-11-28 LAB — CBC
HCT: 34.3 % — ABNORMAL LOW (ref 36.0–46.0)
Hemoglobin: 11.1 g/dL — ABNORMAL LOW (ref 12.0–15.0)
MCHC: 32.3 g/dL (ref 30.0–36.0)
MCV: 80.3 fL (ref 78.0–100.0)
Platelets: 230 10*3/uL (ref 150–400)
RBC: 4.27 MIL/uL (ref 3.87–5.11)
RDW: 14.5 % (ref 11.5–15.5)
WBC: 5.9 10*3/uL (ref 4.0–10.5)

## 2010-11-28 LAB — D-DIMER, QUANTITATIVE (NOT AT ARMC): D-Dimer, Quant: <0.22 ug/mL-FEU (ref 0.00–0.48)

## 2010-11-28 LAB — LIPASE, BLOOD: Lipase: 190 U/L (ref 23–300)

## 2010-11-28 NOTE — Telephone Encounter (Signed)
Called [facility] to speak to Jennifer [name] and had to leave a message for nurse to call back

## 2010-11-29 LAB — URINALYSIS, ROUTINE W REFLEX MICROSCOPIC
Bilirubin Urine: NEGATIVE
Glucose, UA: NEGATIVE mg/dL
Hgb urine dipstick: NEGATIVE
Ketones, ur: NEGATIVE mg/dL
Nitrite: NEGATIVE
Protein, ur: NEGATIVE mg/dL
Specific Gravity, Urine: 1.028 (ref 1.005–1.030)
Urobilinogen, UA: 0.2 mg/dL (ref 0.0–1.0)
pH: 6 (ref 5.0–8.0)

## 2010-11-29 LAB — COMPREHENSIVE METABOLIC PANEL
ALT: 14 U/L (ref 0–35)
AST: 16 U/L (ref 0–37)
Albumin: 3.7 g/dL (ref 3.5–5.2)
Alkaline Phosphatase: 52 U/L (ref 39–117)
BUN: 10 mg/dL (ref 6–23)
CO2: 24 mEq/L (ref 19–32)
Calcium: 8.8 mg/dL (ref 8.4–10.5)
Chloride: 107 mEq/L (ref 96–112)
Creatinine, Ser: 0.54 mg/dL (ref 0.4–1.2)
GFR calc Af Amer: >60 mL/min (ref >60)
GFR calc non Af Amer: >60 mL/min (ref >60)
Glucose, Bld: 100 mg/dL — ABNORMAL HIGH (ref 70–99)
Potassium: 3.7 mEq/L (ref 3.5–5.1)
Sodium: 137 mEq/L (ref 135–145)
Total Bilirubin: 0.4 mg/dL (ref 0.3–1.2)
Total Protein: 6.6 g/dL (ref 6.0–8.3)

## 2010-11-29 LAB — CBC
HCT: 33.3 % — ABNORMAL LOW (ref 36.0–46.0)
Hemoglobin: 11 g/dL — ABNORMAL LOW (ref 12.0–15.0)
MCHC: 33.1 g/dL (ref 30.0–36.0)
MCV: 79.8 fL (ref 78.0–100.0)
Platelets: 212 10*3/uL (ref 150–400)
RBC: 4.17 MIL/uL (ref 3.87–5.11)
RDW: 16 % — ABNORMAL HIGH (ref 11.5–15.5)
WBC: 6.2 10*3/uL (ref 4.0–10.5)

## 2010-11-29 LAB — LIPASE, BLOOD: Lipase: 43 U/L (ref 11–59)

## 2010-11-30 NOTE — Telephone Encounter (Signed)
Monico Blitz from [facility] wanted to know if pt  Has any URI problems that you know of that would preclude her from having surgery. Pt is scheduled to surgery on 5/3

## 2010-12-02 ENCOUNTER — Emergency Department (HOSPITAL_BASED_OUTPATIENT_CLINIC_OR_DEPARTMENT_OTHER)
Admission: EM | Admit: 2010-12-02 | Discharge: 2010-12-02 | Disposition: A | Discharge status: Home / Self Care | Payer: Medicare Other | Attending: Emergency Medicine | Admitting: Emergency Medicine

## 2010-12-02 ENCOUNTER — Emergency Department (INDEPENDENT_AMBULATORY_CARE_PROVIDER_SITE_OTHER): Payer: Medicare Other

## 2010-12-02 DIAGNOSIS — Z79899 Other long term (current) drug therapy: Secondary | ICD-10-CM | POA: Insufficient documentation

## 2010-12-02 DIAGNOSIS — G8929 Other chronic pain: Secondary | ICD-10-CM | POA: Insufficient documentation

## 2010-12-02 DIAGNOSIS — I10 Essential (primary) hypertension: Secondary | ICD-10-CM | POA: Insufficient documentation

## 2010-12-02 DIAGNOSIS — M25519 Pain in unspecified shoulder: Secondary | ICD-10-CM

## 2010-12-02 DIAGNOSIS — K219 Gastro-esophageal reflux disease without esophagitis: Secondary | ICD-10-CM | POA: Insufficient documentation

## 2010-12-02 DIAGNOSIS — M542 Cervicalgia: Secondary | ICD-10-CM

## 2010-12-02 LAB — POCT CARDIAC MARKERS
CKMB, poc: <1 ng/mL — ABNORMAL LOW (ref 1.0–8.0)
Myoglobin, poc: 25.2 ng/mL (ref 12–200)
Troponin i, poc: <0.05 ng/mL (ref 0.00–0.09)

## 2010-12-21 IMAGING — CR DG CHEST 2V
2 series · 2 of 2 positions shown · non-contrast
Comparison: Two-view chest x-ray 09/02/2009, 06/01/2009, and
05/19/2008.

CLINICAL DATA: Shortness of breath.  Chest pain.  Cough.

CHEST - 2 VIEW 09/27/2009:

[w chest pa]
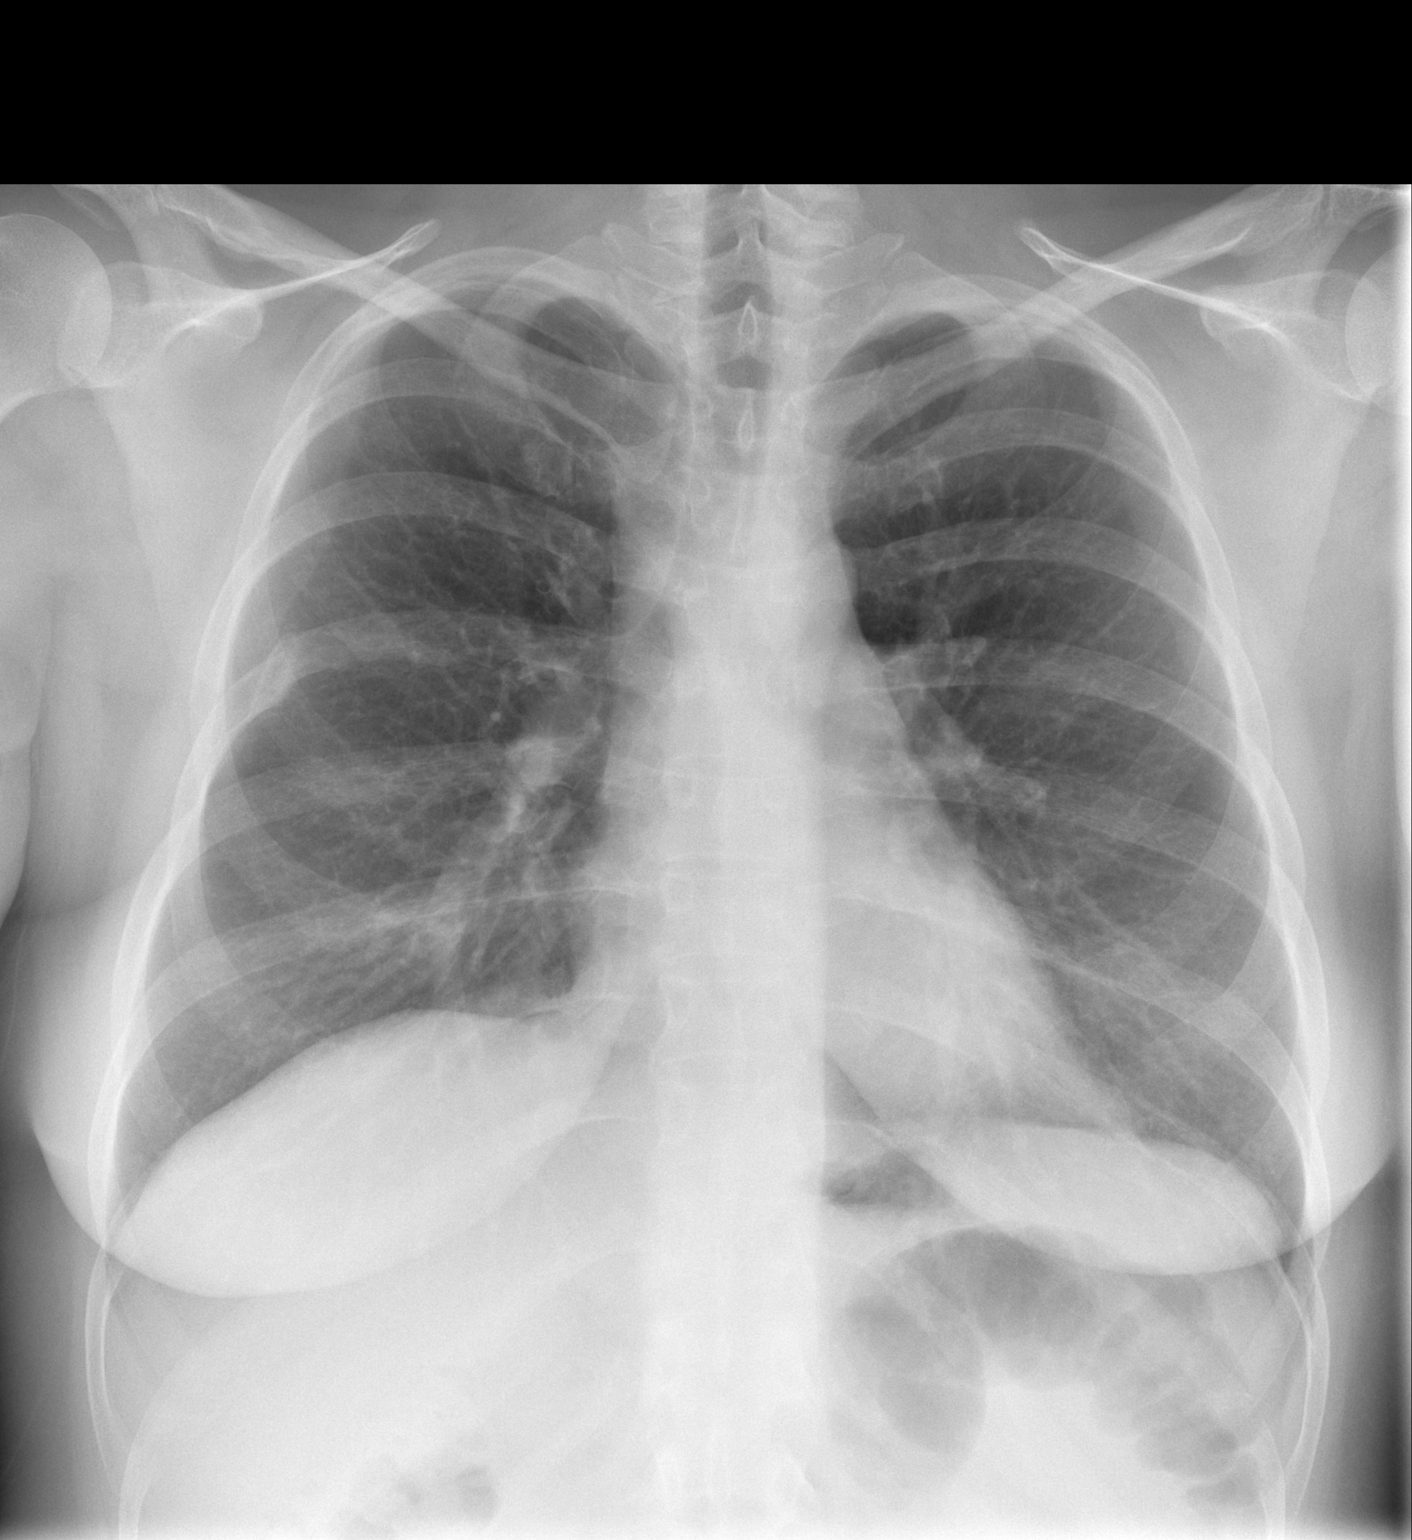

[w chest lat]
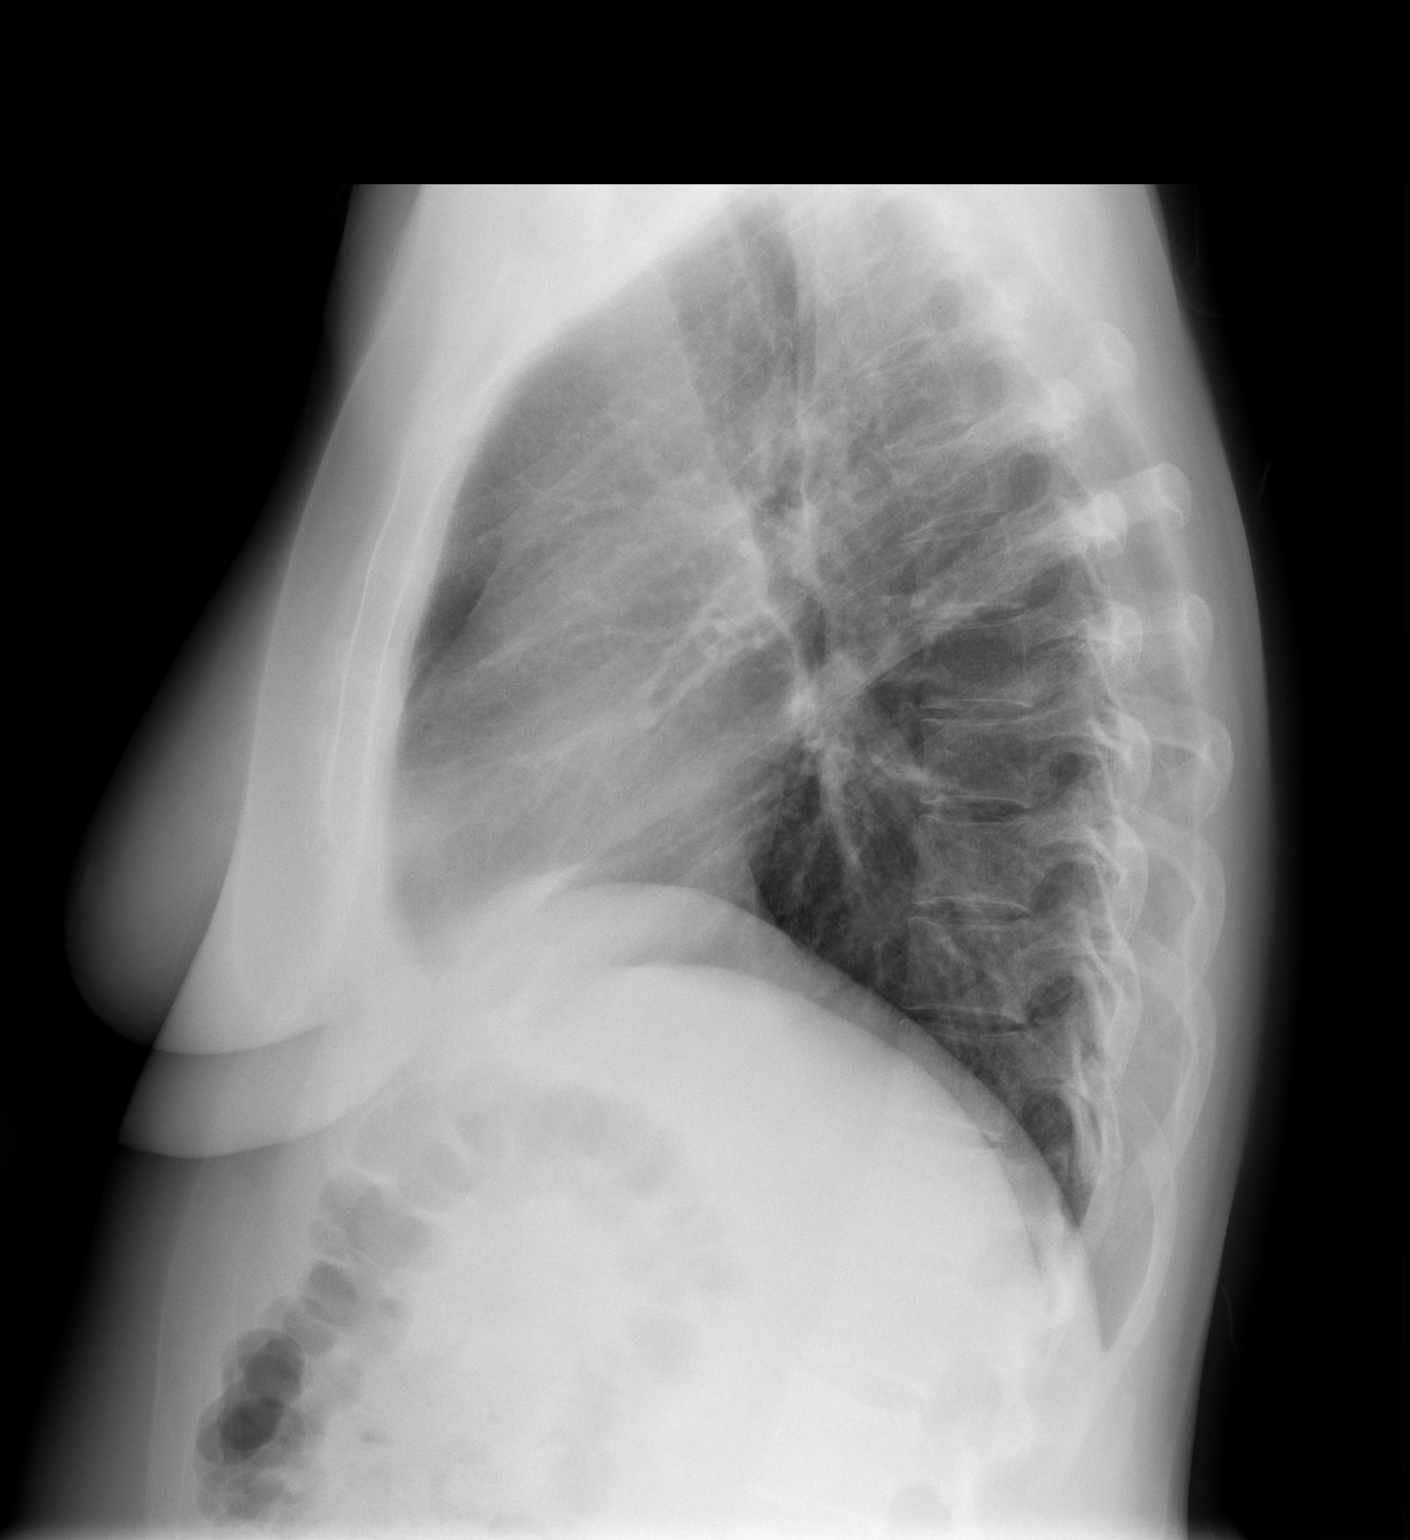

[2 of 2 positions shown; findings below may reference images not displayed]

FINDINGS: Cardiomediastinal silhouette unremarkable and unchanged.
Lungs clear.  Bronchovascular markings normal.  No pleural
effusions.  Old healed posterior right sixth rib fracture.  No
significant interval change.
IMPRESSION: No acute cardiopulmonary disease.  Stable dating back to [place],

## 2010-12-27 NOTE — Assessment & Plan Note (Signed)
[facility]                                 ON-CALL NOTE   NAME:Jennifer [name], Jennifer [name]                     MRN:          [id number]  DATE:04/17/2008                            DOB:          05/18/1966    PRIMARY CARDIOLOGIST:  Pricilla Riffle, MD, [facility]   ELECTROPHYSIOLOGIST:  Doylene Canning. Ladona Ridgel, MD   [facility] contacted me this evening regarding [name].  She came to that facility today stating that her heart had  stopped.  She stated that all four chambers had halted suddenly, while  she was doing something that was not particularly strenuous.  She did  not lose consciousness.  There was no injury.  She had no other symptoms  to report from that.  She came to the [facility] in [facility] because  her symptoms concerned her.  She was evaluated by them and they stated  that she had insisted they contact Cardiology.  All lab work was within  normal limits except for mild anemia with hemoglobin of 11.8 and  hematocrit of 35.4.  I discussed the situation with the fellow on call  and called them back.  I advised them that unless her EKG had some acute  changes or there was some arrhythmia, she did not need admission at this  time.  I advised them that we would contact her next week to come in for  a followup appointment and a message has been left.  She is to continue  all current medications.      Theodore Demark, PA-C  Electronically Signed      Noralyn Pick. Eden Emms, MD, [facility]  Electronically Signed   RB/MedQ  DD: 04/17/2008  DT: 04/18/2008  Job #: [id number]

## 2010-12-27 NOTE — Discharge Summary (Signed)
Jennifer [name], Jennifer [name]              ACCOUNT NO.:  [id number]   MEDICAL RECORD NO.:  [id number]          PATIENT TYPE:  INP   LOCATION:  2002                         FACILITY:  [facility]   PHYSICIAN:  Gerrit Friends. Dietrich Pates, MD, FACCDATE OF BIRTH:  1965/11/07   DATE OF ADMISSION:  04/19/2008  DATE OF DISCHARGE:  04/20/2008                               DISCHARGE SUMMARY   PROCEDURES:  None.   PRIMARY/FINAL DISCHARGE DIAGNOSIS:  Palpitations.   SECONDARY DIAGNOSES:  1. Chest pain.  2. Possible history of autonomic dysfunction.  3. Atrial tachycardia, not inducible.  4. Obstructive sleep apnea, on continuous positive airway pressure.  5. Asthma.  6. Anemia.  7. Hypertension.  8. Hyperlipidemia.  9. History of impaired fasting glucose.  The blood glucose of 93 this      admission.  10.History of esophageal dysmotility.  11.Probable meningioma.  12.History of evaluation for chest pain in 2007 at [facility]      with no significant coronary artery disease by cath per the      patient.  13.Remote history of tobacco use.  14.Family history of coronary artery disease in her father and sister.   TIME AT DISCHARGE:  35 minutes.   HOSPITAL COURSE:  Ms. Ferrell is a 45 year old female with no previous  history of coronary artery disease, although she does have a history of  palpitations and chest pain.  She had palpitations on the 3 days prior  to admission and some associated chest pain.  She was admitted for  further evaluation and treatment.   There was no arrhythmia documented on telemetry overnight.  She did have  some sinus tachycardia.  Her cardiac enzymes were within normal limits.  She had no significant lab abnormalities except for a mild anemia with a  hemoglobin of 11.1, hematocrit of 33.8.  Total cholesterol was 192,  triglycerides 108, HDL 30, LDL 140.  On April 20, 2008, she was seen  by Dr. Dietrich Pates who felt that no further inpatient workup was indicated.  He  considered her stable for discharge with an outpatient event monitor  and follow up with Electrophysiology.   DISCHARGE INSTRUCTIONS:  1. Her activity level is to be increased gradually.  2. She is to stick to a low-fat and low-sodium diet.  3. She is to follow up with Dr. Ladona Ridgel after her CardioNet monitor and      our office will call regarding those.  4. She is to follow up with Dr. Linford Arnold as needed.   DISCHARGE MEDICATIONS:  1. Potassium liquid as prior to admission.  2. Pepcid solutabs 30 mg daily.  3. Xopenex HFA p.r.n.  4. Asmanex inhaler p.r.n.  5. Zofran 4 mg tablets as needed.  6. Aspirin 81 mg daily.      Theodore Demark, PA-C      Gerrit Friends. Dietrich Pates, MD, [facility]  Electronically Signed    RB/MEDQ  D:  04/20/2008  T:  04/20/2008  Job:  [id number]   cc:   Nani Gasser, M.D.

## 2010-12-27 NOTE — Assessment & Plan Note (Signed)
[facility]                                 ON-CALL NOTE   NAME:HOLLISReid, Nawrot                     MRN:          [id number]  DATE:04/25/2008                            DOB:          Sep 26, 1965    I actually received 3 pages through the answering service from Ms.  Rachal.  The first time I attempted to call her answer machine came  on,  I left the voice message, 10 minutes later she called back through the  answering service.  Once again I tried to call her, still no answer.  I  then called the answering service to make them aware of that, I had  attempted twice to reach Ms. Mossberg without success.  Twenty minutes  later she called again, each time she has complained of neck discomfort,  chest discomfort, discomfort between her shoulder blades, shortness of  breath, rapid heart rate, and now her left arm is bothering her.  She  states she has just been recently worked up.  Told she had a rapid heart  rates.  She is also pending evaluation by pulmonary regarding her  shortness of breath.  She is a CPAP patient.  She is telling me that she  has had a D-dimer over 1000 in the past and had a CT scan that did not  show a pulmonary emboli.  She is not a smoker.  She is overweight,  however.  I spent approximately 20 minutes on the phone with this  patient.  Letting her vent her frustration in the health care system and  the lack of compassion when she visits to the emergency room and the  doctor's office.  As to the etiology of her symptoms, she states she has  never really been told what was wrong with her and she is getting very  frustrated.  She was very tearful at times during the conversation.  I  offered to her to either go to [facility] Emergency Room which was the  closest for her or try new [facility] [address] or she could come to [facility] Emergency Room for further evaluation.  The process would be the ER  doctor would see her.  If anything in  her cardiac assessment was  abnormal, then the cardiology fellow would be notified about it.  Otherwise, she is to try her Xopenex neb treatment and see if she gets  any relief.  She is very anxious and tearful as stated on the phone.  I  recommended that she get evaluated tonight for further clarifications of  her symptoms and strongly encouraged her to keep the appointment with  pulmonary for further workup.       Dorian Pod, ACNP  Electronically Signed      Noralyn Pick. Eden Emms, MD, [facility]  Electronically Signed   MB/MedQ  DD: 04/26/2008  DT: 04/26/2008  Job #: [id number]

## 2010-12-27 NOTE — H&P (Signed)
NAMEFALYN, Jennifer [name]              ACCOUNT NO.:  [id number]   MEDICAL RECORD NO.:  [id number]          PATIENT TYPE:  INP   LOCATION:  2002                         FACILITY:  [facility]   PHYSICIAN:  Marca Ancona, MD      DATE OF BIRTH:  08/06/1966   DATE OF ADMISSION:  04/19/2008  DATE OF DISCHARGE:                              HISTORY & PHYSICAL   PRIMARY CARE PHYSICIAN:  None.   CARDIOLOGISTS:  1. Doylene Canning. Ladona Ridgel, MD  2. Pricilla Riffle, MD, [facility]   HISTORY OF PRESENT ILLNESS:  This is a 45 year old with history of a  documented atrial tachycardia and autonomic dysfunction who presents  with several days of exertional chest pain and palpitations.  This began  about 3 days ago and the patient was bending over to feed her cat and  felt like her heart was quivering for about a minute.  She had severe  discomfort from this sensation.  She did not have syncope or presyncope.  Since that time, she reports quite bothersome exertional dyspnea,  exertional chest aching, which is sometimes on the left and sometimes on  the right and exertional diaphoresis.  This is all associated with her  heart raising.  These symptoms occur mainly with activities such as  walking across her room, sometimes they occur at rest.  This has been  going on multiple times over the last 3 days.  Sometimes, these episodes  last for a minute, sometimes they last for hours.  She has had these  symptoms in the past (see past emergency department visits), but she  says that they are considerably worse now.   PAST MEDICAL HISTORY:  1. Autonomic dysfunction.  The patient is thought to have this      diagnosis.  She has frequent episodes of inappropriate sinus      tachycardia.  2. Atrial tachycardia.  This has been demonstrated by Holter monitor.      She did have an EP study at [facility], and per the patient, they      were unable to reproduce an arrhythmia.  3. Obstructive sleep apnea.  The patient uses CPAP.  4.  Asthma.  5. Anemia.  6. Hypertension.  7. Hypercholesterolemia.  8. Impaired fasting glucose.  9. Esophageal dysmotility.  10.Probable meningioma.  11.Left heart catheterization in 2007 at [place], no significant      coronary artery disease was found.   SOCIAL HISTORY:  The patient lives at [facility].  She is married.  She  has 1 son.  She quit smoking in 2002.  She did have 25-pack year history  prior.  She does not drink alcohol.  She is on disability.   FAMILY HISTORY:  The patient's mother died at the age of 6 in a motor  vehicle accident and her father died at the age of 21, he had a history  of coronary artery disease.  The patient does have a sister who had 2  cardiac stents placed at the age of 18.   REVIEW OF SYSTEMS:  Negative except as noted in history of  present  illness.  Chest x-rays shows clear lungs fields.  EKG shows sinus  tachycardia at 109 and no significant STD segment or T-wave changes.   LABS:  Today, BNP is less than 30, cardiac enzymes were negative x1,  urinalysis is negative.  The sodium 140, potassium 3.8, BUN 12,  creatinine 0.7, LFTs are normal.   PHYSICAL EXAMINATION:  VITAL SIGNS:  Blood pressure is 129/70, heart  rate is 90s-100s and regular, O2 sat is 99% on room air.  The patient is  afebrile.  GENERAL:  The patient is in no apparent distress.  She is an obese  female.  NEUROLOGIC:  Alert and oriented x3.  The patient appears anxious.  NECK:  No JVD, no thyromegaly, no nodule.  CARDIOVASCULAR:  Heart, regular S1 and S2.  No S3, no S4, and no murmur.  Normal PMI.  There is no edema.  There is no carotid bruit.  LUNGS:  Clear to auscultation bilaterally with normal respiratory  effort.  ABDOMEN:  Soft, nontender, no hepatosplenomegaly.  Bowel sounds are  present, normal.  SKIN:  Normal exam.  HEENT:  Normal exam.  MUSCULOSKELETAL:  Normal exam.   ASSESSMENT AND PLAN:  This is a 45 year old with history of autonomic  dysfunction and  documented atrial tachycardia who presents with episodes  of chest pain/shortness of breath/diaphoresis/heart raising that are  associated primarily with exertion.  1. Rhythm.  The patient's rhythm currently is sinus tachycardia and      with a rate in 100s.  She does have a history of autonomic      dysfunction with sinus tachycardia as well as a history of atrial      tachycardia.  I do question if the episodes she is reporting today      are associated with runs of atrial tachycardia at a rapid rate.      She did have an electrophysiology study at [facility], on which she      reports they could not demonstrate her arrhythmia.  The atrial      tachycardia, however, has been documented by Holter monitor, so we      will plan to monitor her on telemetry overnight to see if we can      demonstrate atrial tachycardia and if this corresponds to her      symptoms.  If we do demonstrate this, I would consider flecainide      therapy to try to suppress the atrial tachycardia.  I do not think      at this time that an electrophysiology study is warranted as this      patient has already had a negative study in the past.  Also, per      the patient, she has tried multiple beta-blockers in the past and      has not tolerated any of them due to wheezing and hypotension.      Finally, we will have the patient followup here in the hospital      with our EP Service.  2. Chest pain.  As mentioned above, I do wonder if this is secondary      to runs of atrial tachycardia.  She does have a family history of      premature coronary artery disease and the chest pain was primarily      exertional; however, she did have a reportedly negative left heart      catheterization in 2007.  I doubt that this pain is  ischemic;      however, we will rule out an myocardial infarction with serial      enzymes.  Her first set of enzymes was negative.  We will check an      EKG in the morning and put her on aspirin 81 mg  daily.      Marca Ancona, MD  Electronically Signed     DM/MEDQ  D:  04/19/2008  T:  04/20/2008  Job:  [id number]

## 2010-12-27 NOTE — H&P (Signed)
NAMEBRENLEY, PRIORE              ACCOUNT NO.:  [id number]   MEDICAL RECORD NO.:  [id number]          PATIENT TYPE:  OBV   LOCATION:  5127                         FACILITY:  [facility]   PHYSICIAN:  Nestor Ramp, MD        DATE OF BIRTH:  12-03-1965   DATE OF ADMISSION:  05/19/2008  DATE OF DISCHARGE:  05/20/2008                              HISTORY & PHYSICAL   PRIMARY CARE PHYSICIAN:  Doreatha Martin, M.D., [facility].   CHIEF COMPLAINT:  Difficulty swallowing.   HISTORY OF PRESENT ILLNESS:  This is a 45 year old female with chronic  dysphagia.  She states 2 weeks ago she started having neck pain.  She  states that this is worse than usual.  She is afraid she will choke on  her food.  She is having a burning sensation in her neck and upper chest  as well.  She describes it as stridor and had difficulty breathing  earlier today, although now improved.  She is tearful and says her GI  doctor has listed her and will do the correct studies.  She demands to  see Dr. Delford Field, Pulmonology, who has been seeing her for vocal cord  dysfunction.  She thinks her swallowing has gotten worse and has been  vomiting everything she eats for 2 weeks.  She states that she has not  kept anything down including liquids for 2 weeks.  She denies fevers.  She states that she has not lost weight.  She was scheduled to see  Pulmonology tomorrow.  She was seen in the [facility] ED 4 days ago for  the same thing, but was sent home.  She states she is afraid she will  not be able to breathe overnight.  She denies heart pain.   PAST MEDICAL HISTORY:  1. Atrial tachycardia followed by cardiology, although nothing      documented on Holter monitor.  2. Chronic headaches.  3. Sleep apnea.  4. Posttraumatic stress disorder.  5. History of seizures as a child.  6. Chronic neck pain.  7. Hyperlipidemia.  8. Hypertension, no medication.  9. GERD.  10.Chronic dysphagia.  11.IBS.  12.Chronic abdominal pain.  13.Question of vocal cord dysfunction although records do not clearly      indicate this, she has been seen for this.  Gastric emptying study,      EGD, barium swallow all negative in early 2009, esophageal      manometry normal in 2004.  Retrograde colonoscopy, CAT scan in      August 2009, was negative.  14.Allergic rhinitis.  15.Typical cough.   SURGICAL HISTORY:  1. Breast lump removed.  2. Appendectomy.   MEDICATIONS:  1. Xopenex p.r.n.  2. Carafate 10 mL q.i.d.  3. Potassium chloride 20 mEq daily.  4. Prevacid 30 mg daily.  5. Bentyl 5 mL before meals.  6. Zofran p.r.n.   New medication of unknown name related to Prevacid.   ALLERGIES:  IRON, SINGULAIR, CIPRO, PENICILLIN, ERYTHROMYCIN, SULFA,  ROCEPHIN, FLAGYL, BETA-BLOCKERS, REGLAN, and COMPAZINE.   SOCIAL HISTORY:  She is a former Lawyer, who  worked in [place], now on  disability.  She quit smoking in 2001.  Denies alcohol.  Denies drugs.  She has a husband and son whom she lives with.   FAMILY HISTORY:  She has got diabetes in many family members.  She has  siblings of heart disease.  Sisters with asthma.  She has a father with  lung cancer.  Hypertension also runs in the family.   REVIEW OF SYSTEMS:  As in the HPI as well as denies appetite loss.  She  also states that she has a good appetite.  Denies vision changes.  Denies nasal congestion.  Complains of difficulty swallowing,  hoarseness, and sore throat.  Complains of chest pain and discomfort  related to what she thinks is her stomach being full.  Denies shortness  of breath while lying down and does complain of shortness of breath and  wheezing.  Denies abdominal pain, constipation, or bloody stools with  complaints of diarrhea, gas, indigestion, nausea, and vomiting.  Denies  joint pain.  Denies rash.  Denies balance, although complains of memory  loss 2 weeks ago.   PHYSICAL EXAMINATION:  VITAL SIGNS:  Temperature 98.7, O2 is 97 on room  air, heart rate  89, and blood pressure initially 188/110 repeat 128/94.  GENERAL:  Well appearing in no acute distress.  HEENT:  Head, normocephalic and atraumatic.  Ears, within normal limits.  Eyes, pupils equal, round, and reactive to light and accommodation.  Extraocular muscles intact.  Ears and nose unremarkable.  Mouth, she has  moist mucous membranes.  Teeth are in good repair.  NECK:  She has mild neck tenderness in submandibular area.  HEART:  Regular rate and rhythm.  No rubs, gallops, or murmurs.  LUNGS:  Clear to auscultation bilaterally.  No increased work of  breathing.  No crackles or wheezes.  ABDOMEN:  Soft and nontender.  Normal bowel sounds.  No epigastric  tenderness to palpation.  MUSCULOSKELETAL:  She has full range of movement, 5/5 in upper extremity  and lower extremity strength.  No joint tenderness.  PULSES:  No radial and DP pulses.  EXTREMITIES:  No edema.  NEUROLOGIC:  She is alert and oriented x3.  Cranial nerves are intact.  She has strength in lower extremities and has symmetric reflexes.  SKIN:  No rashes.  PSYCH:  She has pressured speech although nontangential.  She has labile  affect, though __________ angry and she has poor eye contact.   LABORATORY DATA:  She has a chest x-ray which shows no active disease.  Her iSTAT, electrolyte panels completely within normal limits.   ASSESSMENT:  White female with difficulty swallowing.  1. Vocal cord disorder.  The patient has a perception of worsening      vocal cord disorder.  However, review of the chart indicates this      is a stable problem with recent negative work up and several visits      to the emergency room throughout the area.  We will continue her      home medications.  We will have speech therapy perform a swallow      evaluation tomorrow.  The patient demands p.o. intake tonight.  We      will start with clears, however, she must prove to be able to      tolerate this prior to advancing her diet as she  stated herself she      is afraid of choking.  Zofran as needed.  The goal is discussed      with swallow study and hopefully discharged tomorrow with follow up      with the primary care physician, GI, and Pulmonary specialist.  No      new imaging is needed as it is not medically indicated as the      patient has had full work up by her regular specialist.  2. Atrial tachycardia.  This is on the problem list, however, there do      not appear to be recordings of this.  She is not on any medications      to treat it.  Her heart rate is normal today, and we will not      change any medication.  She can follow with her cardiologist for      her Holter monitor evaluation.  3. Gastroesophageal reflux disease.  Continue her home medications      regimen, no changes except we will tend to switch her to Protonix      due to pulmonary restriction.  4. Sleep apnea.  CPAP as needed.  5. Somatization disorder.  She seems to be her underlying problems.      She has signs of borderline personality disorders including      sweating.  I would add Munchausen syndrome to differential as well.      She has hyperhidrosis.  The patient states that she cannot tolerate      psych medications.  She sees a therapist for relaxation.  She      should follow up with PCP and I would recommend a full psych      consult in the outpatient setting.  6. Hypertension, elevated on admission, however, within normal limits      now and then our prior office visit.  No medications on her list      and we will not start any at this point.  7. Nausea and vomiting, likely related to her gastroesophageal reflux      disease and underlying dysphagia and vocal cord dysfunction and      continuation of GI problems.  This is repeatedly mentioned in her      problem list and no changes today.  8. Fluids, electrolytes, nutrition.  We will do maintenance IV fluids      and p.o. clears and advance as tolerated.  9. Dysautonomia.  The  patient is on Holter monitor which she can      follow up with  cardiology for  10.Asthma.  She has normal PFTs, however, still continues to use      Xopenex p.r.n.      Johney Maine, M.D.  Electronically Signed      Nestor Ramp, MD  Electronically Signed    JT/MEDQ  D:  05/20/2008  T:  05/21/2008  Job:  [id number]

## 2010-12-27 NOTE — Assessment & Plan Note (Signed)
NAME:  EVOLET, SALMINEN              ACCOUNT NO.:  [id number]   MEDICAL RECORD NO.:  [id number]          PATIENT TYPE:  POB   LOCATION:  [facility] [place]         FACILITY:  [facility]   PHYSICIAN:  Allie Bossier, MD        DATE OF BIRTH:  07-25-1966   DATE OF SERVICE:  04/01/2008                                  CLINIC NOTE   Shannia is a 45 year old married woman, G2, P1, A1, who was seen here  for her annual exam in May 2009, at which time, she had a normal Pap  smear and a normal mammogram.  Ms. Mayden has had multiple ER visits and  primary care physician visits for evaluation of right lower quadrant  pain with radiation to her back since March 20, 2008.  She was seen  yesterday at the [facility] Emergency Room for this pain.  She has had  several normal CAT scans, most recently being March 27, 2008.  An  ultrasound done on March 28, 2008, showed a 1-cm uterine fibroid.  This  is clearly not because of her pain.  Her other complaints include  dysfunctional uterine bleeding and hot flashes.  Upon further  elucidation of this problem, she tells me that she has had MRIs in the  past and has been diagnosed with a bulging disk.  She says that she had  been to a chiropractor once, but did not like the treatment.   On physical exam, she walks with a halting gait, clutching at her right  lower quadrant and bending over.  Her neurologic exam confirms disk  injury.  She certainly has a decreased strength in her left lower  extremities as well as the deep tendon reflexes.  Her patellar deep  tendon reflexes on the right are significantly diminished compared to  the left.  I did a pelvic exam.  Also, her cervix was showing some  spotting.  I did cervical cultures.  Bimanual exam revealed generalized  tenderness throughout, but I do not think this is indicative of pelvic  inflammatory disease, however, her generalized pain condition.   ASSESSMENT AND PLAN:  1. Pelvic pain with radiation to the  back and to her right butt cheek.      I am convinced that this is neurologic in origin, specific bulging      disks.  I strongly recommended that she see a chiropractor and in      fact I will send her a referral to Dr. Beryle Quant here in      [place] office and said that if this does not work, she can see      a Midwife.  2. Hot flashes.  I have checked an FSH.  3. Irregular menstrual pattern.  This may be related to perimenopausal      state, however, I have done cervical cultures and checked a TSH.      She will follow up in 2 weeks for her results of these blood tests.       Allie Bossier, MD     MCD/MEDQ  D:  04/01/2008  T:  04/01/2008  Job:  [id number]

## 2010-12-27 NOTE — Discharge Summary (Signed)
NAMEBETTYLOU, [name]              ACCOUNT NO.:  [id number]   MEDICAL RECORD NO.:  [id number]          PATIENT TYPE:  INP   LOCATION:  3705                         FACILITY:  [facility]   PHYSICIAN:  Duke Salvia, MD, FACCDATE OF BIRTH:  [birth year]   DATE OF ADMISSION:  04/25/2008  DATE OF DISCHARGE:  04/26/2008                         DISCHARGE SUMMARY - REFERRING   DISCHARGE DIAGNOSES:  1. Atypical chest discomfort of uncertain etiology with a history of a      clean cath in 2007.  2. History of atrial tachycardia and autonomic dysfunction.  However,      the patient has been noncompliant with recommendations for further      evaluation.  3. Normocytic anemia.  4. History as listed below.   SUMMARY OF HISTORY:  Jennifer [name] is a 45 year old female who presented  with chest discomfort that awoke her from sleep radiating to her left  arm.  She also stated that she had palpitations up to 150 a minute.  She  had a similar admission April 20, 2008, with a known history of  atrial tachycardia and autonomic dysfunction.  It is noted that the  patient has been noncompliant with event monitor recordings as well as  she also has a history of sleep apnea with CPAP, asthma, hypertension  and hyperlipidemia.  Catheterization in 2007 did not show any coronary  artery disease.   LABORATORY DATA:  Admission H&H was 11.1 and 33.7, normal indices,  platelets 232, WBC 6.8, PTT 30, PT 12.9, sodium 139, potassium 3.5, BUN  14, creatinine 0.58, glucose 124.  CK-MBs, relative indexes and  troponins have been within normal limits x2.  EKG showed normal sinus  rhythm, normal axis, early R-wave, nonspecific ST-T wave changes.   HOSPITAL COURSE:  The patient was admitted to [facility] for  further evaluation of her chest discomfort and palpitations.  Throughout  the night she did not have any further symptoms.  She had ruled out for  myocardial infarction.  There were no events on telemetry.   Dr. Graciela Husbands  felt that she could be discharged home with outpatient followup.   DISPOSITION:  The patient is again discharged home on April 26, 2008, asked to maintain low-sodium heart-healthy diet.  Her activities  were not restricted.  Wound care is not applicable.  She was asked to  continue her home medications and that they remained unchanged.  These  include:   DISCHARGE MEDICATIONS:  1. Aspirin 81 mg daily.  2. Zofran 4 mg p.r.n.  3. Asmanex inhaler as needed.  4. Xopenex HFA as needed.  5. Pepcid SoluTabs 30 mg daily.  6. Potassium as previously.   FOLLOWUP:  She was advised to call Dr. Nani Gasser for a followup  appointment.  She was asked to call Dr. Lubertha Basque office when the cardiac  monitor has been placed and completed.  She was advised to bring all  medications to all appointments.  Discharge time 20 minutes.      Joellyn Rued, PA-C      Duke Salvia, MD, [facility]  Electronically Signed  EW/MEDQ  D:  04/26/2008  T:  04/26/2008  Job:  [id number]

## 2010-12-27 NOTE — Consult Note (Signed)
NAMEKHALIA, GONG              ACCOUNT NO.:  [id number]   MEDICAL RECORD NO.:  [id number]          PATIENT TYPE:  EMS   LOCATION:  MAJO                         FACILITY:  [facility]   PHYSICIAN:  Pricilla Riffle, MD, FACCDATE OF BIRTH:  1965/10/26   DATE OF CONSULTATION:  02/14/2008  DATE OF DISCHARGE:  02/14/2008                                 CONSULTATION   IDENTIFICATION:  Ms. Killian is a 45 year old who comes in for evaluation  of chest pain and palpitations.   HISTORY OF PRESENT ILLNESS:  The patient has a longstanding history of  palpitations.  She has been seen at [facility] as well  as [facility] for atrial tach that has not been inducible.  She  recently saw Sharrell Ku in [facility] on January 29, 2008.   Earlier this week on Wednesday, she was in [facility].  She felt her  heart racing, then pressure in her left side of the chest, and shortness  of breath.  She says this was different then what she has had with  atrial tach in the past, which was more of  a substernal pressure.  She  became quite anxious, walked to [facility], was  evaluated and sent home.  Yesterday, she continued to feel poorly.  She  laid low in the house, still had chest pressure.  The chest pressure has  never gone away, that has gotten worse when she has felt her heart race.  Today again, she continued to feel bad.  She was making biscuits.  She  felt her heart racing.  Chest pressure got worse.  She went and laid  down on the sofa.  She has had some of the left-sided chest pressure  that is worse with breath though this is not it.  She does note some  increases when she lays on her left side.   She says the palpitations are brought down by exercise.  She is tired of  not being able to work out.  She also notes symptoms are worse with  PVCs; at maximum today, it was 7/8 in intensity.  Pain can last at its  peak 1-10 minutes though again she says that has  never gone away.   ALLERGIES:  1. PENICILLIN.  2. MYCINS.  3. SULFA.  4. FLAGYL.  5. CIPRO.  6. DOXYCYCLINE, leading to GI upset.  7. PHENERGAN.  8. COMPAZINE.   CURRENT MEDICINES:  1. Prevacid SoluTab.  2. Tylenol p.r.n.  3. Xopenex p.r.n.  4. Asmanex p.r.n.   PAST MEDICAL HISTORY:  1. Presumed atrial tachycardia.  2. History of asthma.  3. History of anemia.  4. History of obstructive sleep apnea, being evaluated and set up for      CPAP.  5. History of hypertension, though erratic on medicines.  6. Increased lipids.  7. Borderline diabetes.  8. History of duodenitis and poor esophageal motility.  9. History of a 3 x 6-mm nodule in her brain, felt as meningioma, will      need followup.  10.Question lung nodule.   SURGICAL HISTORY:  1. Appendectomy.  2. Eye surgery.  3. Breast lumpectomy.  4. EGD with dilatation.  5. Reported cath and EP evaluation at [facility], Dr. Rudolpho Sevin.   SOCIAL HISTORY:  The patient lives in [place].  She is married, has a  son.  She is on disability, quit tobacco in 2002 after 25-pack-year,  does not drink.   FAMILY HISTORY:  Mother died in 71 of MVA (the patient is tearful  talking about it).  Father died at age 8, had coronary disease.  One  sister with 2 stents.  One brother who had valve problems.  Question,  one sister with an MI.   REVIEW OF SYSTEMS:  Complains of flush feeling.  Had one episode of  syncope in the past year, not sure how long she was out.  She was  standing, things started tunneling, she grabbed someone in front of her.  Notes some hearing loss, chest pressure, shortness of breath, dyspnea as  noted, palpitations as noted.  GU negative.  History of depression,  anxiety, and arthralgias.  History of bright red blood per rectum with  hemorrhoids, fissure.  History of GE reflux, moderate.  Has had  dilatation as noted above.  Felt borderline diabetes.  Otherwise, all  systems reviewed and negative for  the above problem except as noted.   PHYSICAL EXAMINATION:  GENERAL:  The patient is very anxious with  talking, becomes tearful at times.  Tired of feeling bad.  VITAL SIGNS:  Temperature on arrival 100.2.  Supine blood pressure  140/82, pulse 69; sitting 134/76, pulse 80; standing at 2 minutes  134/84, pulse 90 and at 5 minutes 122/80, pulse 90.  HEENT:  Normocephalic and atraumatic.  EOMI.  PERRL.  Mucous membranes  are moist.  NECK:  JVP is normal.  No thyromegaly or bruits.  LUNGS:  Clear without wheezes or rales.  Moving air well.  CARDIAC:  Regular rate and rhythm.  S1, S2.  No S3, S4, or murmurs.  CHEST:  Tender in the left lateral side, different.  ABDOMEN:  Supple and nontender.  Normal bowel sounds.  No hepatomegaly.  EXTREMITIES:  Good distal pulses.  No lower extremity edema.  NEURO:  Alert and oriented x3.  Cranial nerves II-XII grossly intact.  Moving all extremities.   LABORATORY DATA:  Chest x-ray, no acute disease.  A 12-lead EKG with  sinus rhythm, T-wave inversion in III, old labs significant for  hemoglobin of 11.7, WBC of 6.2, BUN and creatinine of 13 and 0.8,  potassium on i-STAT is 3.2.  We will repeat.  Troponin less than 0.05,  CK-MB less than 0.1.  Urine drug screen negative.  UA negative.  D-dimer  negative.   IMPRESSION:  The patient is a 45 year old woman with a history of atrial  tachycardia and chest pain, seen in the ER this week with chest pain,  palpitations, shortness of breath and went home; has never had been free  of pain since being home; has just been exacerbated with high heart  rate.  Today, she continued to feel bad, came to the ER.  Exam is  unremarkable except for some mild orthostasis.  EKG is negative.  CK,  troponin, and D-dimer negative despite 3 days of discomfort.  No  evidence of atrial tachycardia.  Her potassium is a little bit low.  We  will check magnesium.   Indeed, the patient's symptoms may be related to some dysautonomia.   She  may indeed also  have an atrial tachycardia although I do not have  demonstration of this.  Note, she has been seen by multiple  cardiologists.  She has been intolerant, she said, to a beta-blocker and  clonidine in the past.  We will consider Celexa as SSRI for  dysautonomia.  Blood pressure increased at home with K a little low,  reluctant to try Florinef, but we will follow.  I do not think the  patient needs to be admitted.  She has followup on Monday.  Calcium-  channel blocker may exacerbate symptoms.      Pricilla Riffle, MD, [facility]  Electronically Signed     PVR/MEDQ  D:  02/14/2008  T:  02/15/2008  Job:  [id number]

## 2010-12-27 NOTE — Assessment & Plan Note (Signed)
[facility]                         ELECTROPHYSIOLOGY OFFICE NOTE   NAME:Jennifer [name], Jennifer [name]                     MRN:          [id number]  DATE:02/17/2008                            DOB:          1966/07/04    Jennifer [name] returns today for followup.  She is a very pleasant 45-year-  old woman with a history of tachy palpitations who has clear autonomic  dysfunction, but also probably has an atrial tachycardia as well as  sinus tachycardia.  She was recently seen in the emergency department  with atypical chest pain, which is more pleuritic in nature.  I suspect  it is related to a post viral syndrome.  She returned today for  followup.  She denies worsening chest pain and this is improved.  She  does still have occasional palpitations.  She is anxious and tearful  today.   CURRENT MEDICATIONS:  1. Nasonex.  2. Asmanex.  3. Prevacid.  4. Potassium.   PHYSICAL EXAMINATION:  GENERAL:  She is a pleasant 45 year old woman in  no distress.  VITAL SIGNS:  Blood pressure 165/100, the pulse 97 and regular,  respirations were 18, and weight is 170 pounds.  NECK:  No jugular venous distention.  LUNGS:  Clear bilaterally to auscultation.  No wheezes, rales, or  rhonchi are present.  CARDIOVASCULAR:  Regular rate and rhythm.  Normal S1 and S2.  EXTREMITIES:  No edema.   IMPRESSION:  1. Autonomic dysfunction.  2. Probable atrial tachycardia.   DISCUSSION:  Jennifer [name] is stable, but she has been prescribed Celexa by  Dr. Tenny Craw.  I have recommended that she start this medication and had a  half-dose increments and see me back in the office in 6-8 weeks.      Doylene Canning. Ladona Ridgel, MD  Electronically Signed    GWT/MedQ  DD: 02/17/2008  DT: 02/18/2008  Job #: [id number]   cc:   Nani Gasser, M.D.

## 2010-12-27 NOTE — Consult Note (Signed)
NAMEJOSELYNE, SPAKE              ACCOUNT NO.:  [id number]   MEDICAL RECORD NO.:  [id number]          PATIENT TYPE:  OBV   LOCATION:  5127                         FACILITY:  [facility]   PHYSICIAN:  Antonietta Breach, M.D.  DATE OF BIRTH:  May 20, 1966   DATE OF CONSULTATION:  05/20/2008  DATE OF DISCHARGE:  05/20/2008                                 CONSULTATION   REFERRING PHYSICIAN:  Huntley Dec L. Jennette Kettle, MD.   REASON FOR CONSULTATION:  Anxiety including vocal cord dysfunction.   HISTORY OF PRESENT ILLNESS:  Mrs. Jamiah Homeyer is a 45 year old female  admitted to the [facility] on October 6 for dysphagia.   She also has been experiencing chest pain with palpitations.  She does  have a history of approximately 4 weeks of increasing feeling on edge  and frustration with her somatic symptoms.  She has episodes of  tachycardia.  She has much worry about the tachycardia.  She does  mention that a number of family members have had severe cardiac illness.   She does not have any thoughts of harming herself or others.  She has no  hallucinations or delusions.  She does have intact interests and hope.   Psychosocial techniques including psychotherapy have not remedied the  symptoms.   PAST PSYCHIATRIC HISTORY:  Effexor caused akathisia.  She was given  Trilafon at one point for severe anxiety which caused some  extrapyramidal side effects and was discontinued.   FAMILY PSYCHIATRIC HISTORY:  None known.   HISTORY OF PRESENT ILLNESS:  Mrs. Belmonte lives in [place].  She is  married.  She has one son.  She is disabled.  She does not use alcohol  or illegal drugs.   FAMILY MEDICAL HISTORY:  Her father died at age 43 from coronary artery  disease complications.  She has possibly as many as three siblings with  coronary artery disease complications.   PAST MEDICAL HISTORY:  Episodes of atrial tachycardia, asthma, anemia,  hypertension, esophageal dysmotility.  Also mentioned in review of  the  past medical record is the phenomenon of autonomic dysfunction.   MEDICATIONS:  MAR is reviewed.  She is on Benadryl 12.5 mg t.i.d.   ALLERGIES:  CIPROFLOXACIN, ERYTHROMYCIN, IRON, PENICILLIN, SULFA,  SINGULAIR, FLAGYL, KETOROLAC, REGLAN AND PHENERGAN.   LABORATORY DATA:  Sodium 140, BUN 10, creatinine 0.8, glucose 83,  potassium 3.7.  WBC 6.8, hemoglobin 11.1, platelet count 232.  INR,  magnesium, TSH, urine pregnancy test all negative.   REVIEW OF SYSTEMS:  CONSTITUTIONAL,HEAD, EYES, EARS, NOSE, THROAT,  MOUTH, NEUROLOGIC, PSYCHIATRIC, CARDIOVASCULAR, RESPIRATORY,  GASTROINTESTINAL, GENITOURINARY, SKIN, MUSCULOSKELETAL, HEMATOLOGIC,  LYMPHATIC, ENDOCRINE, METABOLIC:  All unremarkable.   PHYSICAL EXAMINATION:  VITAL SIGNS:  Temperature 98.1, pulse 76,  respiratory rate 22, blood pressure 130/85, O2 saturation on room air  100%.  GENERAL APPEARANCE:  Mrs. Buxbaum is a middle-aged female sitting up in  her hospital bed with no abnormal involuntary movements.   MENTAL STATUS EXAM:  Mrs. Daugherty does maintain good eye contact.  Her  attention span is within normal limits.  Her concentration is slightly  decreased which  is concurrent with anxious affect and anxious mood.  She  is oriented to all spheres.  Her memory is intact to immediate recent  and remote.  Her fund of knowledge and intelligence are within normal  limits.  Speech involves normal rate and prosody without dysarthria.  Thought process is logical, coherent, goal-directed.  No looseness of  associations.  Thought content, no thoughts of harming herself, no  thoughts of harming others, no delusions, no hallucinations.  Insight is  intact.  Judgment is intact.   ASSESSMENT:  AXIS I:  293.84, anxiety disorder, not otherwise specified.  There is a correlation between the patient's increased anxiety and  greater likelihood of tachycardiac periods, as well as dysmotility  however, it is not clear if there is a absolute  correlation.  Rule out  generalized anxiety disorder.  AXIS II:  None.  AXIS III:  See past medical history.  AXIS IV:  General medical.  AXIS V:  55.   Mrs. Bamberg is not at risk to harm herself or others.  She agrees to  call emergency services immediately for any psychiatric emergency  symptoms.   The undersigned provided ego supportive psychotherapy and education.   The indications, alternatives and adverse effects of Remeron, Ativan and  Celexa were discussed with the patient including the risk of Ativan  dependence.  She understands and wants to proceed as below.   RECOMMENDATIONS:  Would start Remeron at 7.5 mg p.o. daily at 1800.  The  Remeron will provide anti 5-HT II properties which can reduce the risk  of akathisia when starting an SSRI.  The Remeron also can decrease acute  anxiety.   In a small double blind trial with pre-surgery anxiety, Valium was found  to be equivalent to Remeron and the treatment of acute anxiety.   Therefore, if tolerated and there is no sedation the patient could be  given Remeron 7.5 mg t.i.d. however, if there is significant sedation  would give all the Remeron at night, would titrate by 7.5 mg nightly to  the initial target dose of 22.5 mg nightly.   Once the Remeron is on board, would start Celexa at a very low dose at 5  mg q.a.m. would then titrate by 5 mg q. week as tolerated to the initial  target dose of 40 mg daily (in treating anxiety SSRIs are most effective  at 75% to 100% of the maximum daily dosage and require approximately 16  weeks for maximum efficacy).   There is the possibility of 1/20 Caucasians, particularly of  Mediterranean decent having a cytochrome P450 2D6 enzyme deficiency.  If  that is the case, it can result in extreme serotonergic side effects at  normal starting doses of SSRIs.  Therefore, even if the Celexa starting  dosage is extremely low would keep that unlikely enzyme deficiency in  mind.   For the  patient's anti-acute anxiety a benzodiazepine will be important  would utilize Ativan 0.5-1 mg t.i.d. p.r.n.  The patient agrees to not  drive if drowsy.   Mrs. Eberlin does have some aversion to mental health care given past  failed attempts at helping her however, if she is amenable would have  her set up with an outpatient psychiatrist as soon as possible.  They  are available at one of the clinics attached to [facility] or [facility].   Also, Mrs. Salberg could benefit from cognitive behavioral therapy  combined with deep breathing and progressive  muscle relaxation.      Antonietta Breach, M.D.  Electronically Signed     JW/MEDQ  D:  05/21/2008  T:  05/21/2008  Job:  [id number]

## 2010-12-27 NOTE — Discharge Summary (Signed)
[name], [name]              ACCOUNT NO.:  [id number]   MEDICAL RECORD NO.:  [id number]          PATIENT TYPE:  OBV   LOCATION:  5127                         FACILITY:  [facility]   PHYSICIAN:  Leighton Roach McDiarmid, M.D.DATE OF BIRTH:  1966/06/06   DATE OF ADMISSION:  05/19/2008  DATE OF DISCHARGE:  05/20/2008                               DISCHARGE SUMMARY   DISCHARGE DIAGNOSES:  1. Dysphagia.  2. Vocal cord dysfunction.  3. GERD.  4. Atrial tachycardia.  5. Somatization disorder.  6. Nausea and vomiting.  7. Anxiety.   DISCHARGE MEDICATIONS:  1. Xopenex 45 mcg p.r.n.  2. Carafate 10 mL q.i.d.  3. Potassium chloride 20 mEq daily.  4. Prevacid SoluTab 30 mg daily.  5. Zofran 4 mg p.r.n.  6. Bentyl 5 mL prior to meals.  7. Remeron 7.5 mg nightly.  8. Ativan 0.5 mg t.i.d. p.r.n. anxiety.   FOLLOWUP:  1. The patient will follow up with Dr. Alwyn Ren.  She will call to      make an appointment within a week.  2. The patient will also follow up with Dr. Delford Field and she needs to      schedule an appointment.  3. The patient will also follow up with Dr. Madilyn Fireman with      Gastroenterology, and they will call the patient for an      appointment.   FOLLOWUP ISSUES:  The patient continue to have complaints throughout her  hospital course and was therefore referred for a second opinion of  Gastroenterology with Dr. Madilyn Fireman.  They will assess her dysphagia and  review the results of a modified Barium swallow.  The patient will also  follow up with Psychiatry as we have requested.   HOSPITAL COURSE:  This is a 45 year old female who came in with a  complaint of choking and unable to breath secondary to dysphagia.  Please see hospital H&P for details as she was here less than 24 hours.  Briefly, she came in for what sounds like acute exacerbation of chronic  problem.  She continued to be agitated and upset throughout the hospital  course, and making demands about further imaging studies  despite normal  imaging studies in the past.  We have tried to continue her home  medications, and limit the amount of studies that we performed.  Please  see the following for details.   1. Dysphagia:  The patient continue to say she was unable to swallow.      However, the bedside swallow showed no signs and symptoms of      aspiration, although there was a report of questionable esophageal      dysmotility.  She also had modified Barium swallow, which was      completely within normal leaflets that showed no reflux, no      stricture, and no esophagitis.  Despite the patient's fever and      chill, she continued to demand normal diet.  Given her concern and      initial presentation of dysphagia, we only put her on clear liquids  and advance as tolerated.  We did not change any medications.  The      patient was referred to Dr. Madilyn Fireman as she feels like her regular      doctor in [facility] is not listening to her, and that the studies      are not accurate.  Please see HPI for details.  2. Nausea and vomiting:  We have put the patient on Zofran.  She      continue to spit up and have mild vomiting.  Again, no etiology was      found.  We feel that this is the patient's baseline.  Despite this      report of nausea and vomiting for two weeks, she remained well      hydrated without any weight loss.  She was discharged home on      Zofran.  3. Depression/anxiety:  It seems quite obvious that the patient's      underlying psychiatric problems are contributed to her medical      illnesses.  On admission, she refused any kind of psychiatric      medication.  However, she did agree to a psychiatric consult during      this hospital stay.  Dr. Jeanie Sewer, psychiatrist, saw her and felt      that indeed her anxiety was contributing to some of her medical      comorbidity.  He recommended starting her on Remeron 7.5 mg nightly      as well as Ativan.  He also recommended Celexa, although we  did not      start it at discharge.  She can start this via her primary care      Denee Boeder in the outpatient setting.  The recommendation was to      start Celexa at a very low dose of 5 mg and titrate 5 mg every week      until target dose of 40.  He also recommended to increase the      Remeron to target dose of 22.5 mg nightly.  He also started p.r.n.      Ativan to help with anxiety.  He recommended followup at the [facility].  The patient was deemed safe.  4. GERD:  The patient was very particular and had many allergies to      medications.  We attempted to continue on her medicine that she      would tolerate.  At discharge she was put back on her home regimen      of Prevacid Solutab.  5. Atrial tachycardia:  The patient remained in normal sinus rhythm      throughout her hospital stay.  We did not consult Cardiology.   LABORATORY DATA:  The patient had a normal i-STAT chemical lab.   IMAGING:  Chest x-ray, no active disease.  Modified barium swallow.  Normal examination except a small sliding hiatal hernia, which  demonstrates a reflux stricture and radiographic changes on esophagitis.  The patient was discharged in improved and stable condition with close  followup.      Johney Maine, M.D.  Electronically Signed      Leighton Roach McDiarmid, M.D.  Electronically Signed    JT/MEDQ  D:  05/23/2008  T:  05/24/2008  Job:  [id number]   cc:   Doreatha Martin, M.D.  John C. Madilyn Fireman, M.D.  Charlcie Cradle Delford Field, MD, FCCP

## 2010-12-27 NOTE — Assessment & Plan Note (Signed)
[facility]                         ELECTROPHYSIOLOGY OFFICE NOTE   NAME:HOLLISAndjela, Wickes                     MRN:          [id number]  DATE:05/27/2008                            DOB:          Feb 19, 1966    Ms. Linhart returns today for followup.  She is a very pleasant 45-year-  old woman with a history of tachy palpitations and up until now no  documented arrhythmias who has been wearing cardiac monitor for the last  several weeks.  She returns today for followup.  The patient states that  last night, she had her palpitations which were fairly short lived, but  clearly bothersome to her and activate her cardiac monitor.  She returns  today for additional evaluation.   MEDICATIONS:  1. Nasonex spray.  2. Prevacid 30 a day.  3. Multiple vitamin.  4. Potassium.  5. Bentyl.   PHYSICAL EXAMINATION:  GENERAL:  She is a pleasant well-appearing woman  in no distress.  VITAL SIGNS:  Blood pressure 121/76, pulse 95 and regular, respirations  were 18, weight was 170 pounds.  NECK:  No jugular venous distention.  LUNGS:  Clear bilaterally to auscultation.  No wheezes, rales or rhonchi  are present.  CARDIOVASCULAR:  Regular rate and rhythm.  Normal S1 and S2.  EXTREMITIES:  Demonstrate no edema.  SKIN:  Demonstrate multiple erythematous macules from her contact with  her electrodes while wearing her [employer] monitor.   Interrogation and review of the cardiac monitor demonstrates periods of  nonsustained atrial tachycardia at rates of up to 150 beats per minute.  These are short lived, typically lasting approximately 10 seconds.   IMPRESSION:  1. Symptomatic palpitations.  2. Nonsustained supraventricular tachycardia (atrial tach).   DISCUSSION:  Ms. Stille is stable.  I have reinforced the benign nature  of her symptoms.  I have asked that she take off her cardiac monitor and  I will plan to see the patient back in the office on an as-needed  basis.     Doylene Canning. Ladona Ridgel, MD  Electronically Signed   GWT/MedQ  DD: 05/27/2008  DT: 05/28/2008  Job #: [id number]

## 2010-12-27 NOTE — H&P (Signed)
NAME:  Jennifer [name], Jennifer [name]              ACCOUNT NO.:  [id number]   MEDICAL RECORD NO.:  [id number]          PATIENT TYPE:  POB   LOCATION:  [facility]                          FACILITY:  [facility]   PHYSICIAN:  Satira Anis, MDDATE OF BIRTH:  04-20-1966   DATE OF ADMISSION:  DATE OF DISCHARGE:                              HISTORY & PHYSICAL   REASON FOR ADMISSION:  Chest pain.   HISTORY OF PRESENT ILLNESS:  This is a 45 year old female with history  of atrial tachycardia, an autonomic dysfunction who presents for chest  pain that awoke her from sleep.  The chest pain was pericardial in  location, aching in nature, and radiating to the left arm.  She also  felt that her heart was racing at that time up to 150 beats per minute.  She presents to the ER and sublingual nitroglycerin was administered in  the emergency room with some relief.  The pain slowly resolved.  She has  been recently admitted with similar kind of chest pain syndrome and  discharged on April 20, 2008, after serial cardiac enzymes rule out  any acute coronary syndrome.  The patient has not offered any other  complaints.  She claims that she has diaphoresis during the episode.   PAST MEDICAL HISTORY:  1. Autonomic dysfunction.  2. Atrial tachycardia.  Event monitor was provided to the patient, but      she is not compliant with this recordings and obstructive sleep      apnea, uses CPAP.  3. Asthma.  4. Hypertension.  5. Hypercholesteremia.  6. Left heart catheterization in 2007 at [facility] with no      significant coronary artery disease.   SOCIAL HISTORY:  She lives at [facility], married, has one son, quit  smoking in 2002, has a 25-year pack smoking history, no alcohol abuse.  She is on disability.   FAMILY HISTORY:  The patient's mother died at the age of 64 in a motor  vehicle accident, father died at age of 26 from coronary artery disease,  and sister had 2 stents placed at the age of 25.   REVIEW OF  SYSTEMS:  Negative except as stated in the history of present  illness.   PHYSICAL EXAMINATION:  VITAL SIGNS:  Blood pressure is 139/74, pulse is  96, respirations 12.  HEAD AND NECK:  Anicteric, clear.  No pallor or conjunctivae.  Mucous  membranes are moist.  JVP is normal.  Carotid upstroke is brisk.  Normal  carotid __________.  Thyroid is normal.  CHEST:  Clear.  Equal air entry.  Normal breath sounds.  HEART:  S1, S2, regular.  No murmurs.  No gallop , no pericardial rubs.  ABDOMEN:  Soft, nontender.  No hepatosplenomegaly.  Bowel sounds are  normal.  SKIN:  Normal exam.  HEENT:  Normal exam.  MUSCULOSKELETAL:  Normal exam.   LABORATORY VALUES:  WBC 6800, hemoglobin 11 mg/dL, hematocrit 16.1,  platelet count 232, 000.  Sodium 141, potassium 3.5, BUN 15, creatinine  0.6, troponin I is less than 0.5, sodium 139, potassium 3.5, and BUN is  14,  creatinine is 0.6, CK is 25, troponin I is 0.01, INR is 1.  X-ray  shows no infiltrate or congestive heart failure.   ASSESSMENT AND PLAN:  A 45 year old female with history of autonomic  dysfunction, atrial tachycardia who presents with episodes of chest  pain, shortness of breath, and diaphoresis.  Her electrocardiogram shows  some sinus rhythm with nonspecific ST-T changes.  Most probably nonanginal chest pain.  We will obtain serial cardiac  enzymes, and the patient's all other home medications will be continued.  Further management as per Dr. Ladona Ridgel.      Satira Anis, MD  Electronically Signed     RN/MEDQ  D:  04/26/2008  T:  04/26/2008  Job:  [id number]

## 2010-12-27 NOTE — Assessment & Plan Note (Signed)
[facility]                                 ON-CALL NOTE   NAME:Jennifer [name], Jennifer [name]                     MRN:          [id number]  DATE:02/16/2008                            DOB:          Jul 10, 1966    HISTORY OF PRESENT ILLNESS:  Mrs. Vancamp is a 45 year old female who  called tonight complaining of palpitations and chest pain.  She was seen  yesterday in the [facility] emergency department by Dr. Dietrich Pates for  the same.  At that time, EKG was negative, CK, troponin and D-dimer were  negative despite 3 days of discomfort.  There was no evidence of atrial  tachycardia.  She was sent home with a follow-up appointment arranged on  Monday at 4:00 p.m. with [facility] cardiology, Dr. Ladona Ridgel.  Her symptoms  continued today with palpitations and premature ventricular  contractions.  The pain is exacerbated when she lays on her left side  and she has shortness of breath and sweating with this.  She tells me  that she was evaluated at the [facility] emergency department today  where they ruled her out for myocardial infarction with troponins and  could not find anything wrong with her.  They recommended if she  continued to have symptoms that she be seen at the [facility] emergency  department as this is where her follow up cardiology appointments are to  be, and that she lives in [place].  After speaking with her on the  phone, it appears that her symptoms have not changed since she was seen  in the [facility] emergency department.  I told her that we were happy  to see her in the [facility] emergency department again;  however, if we  found no changes on her EKG and her troponins were negative, we would  send her home for follow-up appointment on Monday as scheduled.  She had  decided to go home and see if she can get this under control through  relaxation.  I have requested that if her symptoms become much more  severe or intolerable that she does come to the  emergency department for  evaluation, and she is agreeable to this.  Otherwise, she will be seen  again on Monday at 4:00 p.m.     Unice Cobble, MD  Electronically Signed    ACJ/MedQ  DD: 02/16/2008  DT: 02/16/2008  Job #: [id number]

## 2010-12-27 NOTE — Assessment & Plan Note (Signed)
[place] [facility]                         ELECTROPHYSIOLOGY OFFICE NOTE   NAME:HOLLISLakeyta, Vandenheuvel                     MRN:          [id number]  DATE:07/08/2008                            DOB:          26-Dec-1965    Ms. Balderrama returns today for followup.  She is a very pleasant 45-year-  old woman with a history of palpitations and documented nonsustained  atrial tachycardia.  The patient has a history of recent Botox  injection, I think, in her throat, though I do not know the specifics of  this maybe with her jaw.  The patient is quite upset today and anxious.  She was seen in the [facility] yesterday and at  [facility] Emergency Department earlier today.  Her symptoms are multiple.  Overall, she states she feels bad.  She has cramps.  She has headaches.  She has palpitations.  She has multiple aches and pains.  She notes that  she has not been sleeping well.  The patient denies medical  noncompliance.   She is on iron supplements.  She is on Bentyl 5 mg 3 times a day,  Prevacid 3 mg a day, Nasonex, and a multivitamin.  She also takes  potassium.   On exam, she is a anxious and tearful young woman in no distress.  She  is anxious and tearful.  She is in no respiratory distress.  Her blood  pressure was 147/100, the pulse 81 and regular, respirations were 18.  Weight was not obtained today.  The neck revealed no jugular venous  distention.  The lungs clear bilaterally to auscultation.  No wheezes,  rales, or rhonchi are present.  Cardiovascular exam revealed regular  rate and rhythm.  Normal S1 and S2.  The abdominal exam is soft,  nontender.  Extremities demonstrated no edema.   IMPRESSION:  1. Nonsustained atrial tachycardia, now well controlled.  2. Borderline hypertension.  3. Worsening anxiety with pressured speech, lack of sleep, and flights      of ideas worrisome for mania.   DISCUSSION:  I am not convinced that the patient is  manic today.  She is  not hallucinating.  She does not appear to be psychotic, but she does  certainly have very pressured speech and has flights of ideas and is  convinced that she has multiple medical problems despite being evaluated  by several physicians at several different times in the last several  days.  I have recommended that the patient be admitted to the hospital  for additional evaluation.  I think, she needs psychiatric care and she  may well require medications for what sounds like mania to me.  The  patient is not willing to be admitted to our hospital, [facility].  She states that she wants to be closer to the family.  I have spoken to  her therapist who his Dr. Stevphen Meuse in [facility] about the patient  today and she is going to call the patient, and I think based on the  results of her call offer her admission for additional treatment.     Sharlot Gowda  Myna Hidalgo, MD  Electronically Signed   GWT/MedQ  DD: 07/08/2008  DT: 07/08/2008  Job #: [id number]

## 2010-12-27 NOTE — Letter (Signed)
January 29, 2008    Nani Gasser, M.D.  [phone]  [place], [place] [address]   RE:  [name], [name]  MRN:  [id number]  /  DOB:  Jul 14, 1966   Dear Dr. Linford Arnold:   Thank you for referring Jennifer [name] for EP evaluation.  As you know,  she is a very pleasant 45 year old woman who has a longstanding history  of tachy palpitations, which have increased in frequency and severity.  She has a history of atrial arrhythmias and documented atrial  tachycardia by cardiac monitoring at rates of up to 160 beats per  minute.  Despite this, the patient underwent an EP study by Dr. Jeanne Ivan  back in January at the [facility], and by the patient's  report (I do not have the records of the procedure itself)  she had no  inducible arrhythmias.  She had apparently another episode for which she  went to the [facility] and was seen by Dr. Alycia Rossetti and was at  that time recommended that she have a repeat EP study and ablation done,  but the patient had refused.  She returns today for additional  evaluation.  She has never had frank syncope,  but she does have dizzy  spells, and she knows that she has 2 different types of  palpitations;  one where her heart rate raises very rapidly associated with dizziness  and lightheadedness and near syncope and documented heart rates of 160,  and the other episode occurs when her heart rate appears to be in the  100-120 range.  I suspect these are related to sinus tachycardia, and  she clearly has evidence of autonomic dysfunction.   With all of the above, I have referred you my clinic note which goes  over the details, but at this point, I have recommended the patient  undergo a period of watchful waiting with increase in her food and salt  intake, and I have recommended a very mild exercise regimen with daily  walking.  Obviously, if her symptoms worsen, then consideration for  antiarrhythmic therapy would be reasonable with  flecainide, which might  also improve her sinus tachycardia.  Alternatively,  catheter ablation of her atrial tachycardia could be considered  particularly if her arrhythmias in the interim become more incessant.  I  will plan to see the patient back in 7 months.   Thank you for referring Mrs. Dirden for EP evaluation.    Sincerely,      Doylene Canning. Ladona Ridgel, MD  Electronically Signed    GWT/MedQ  DD: 01/29/2008  DT: 01/30/2008  Job #: [id number]

## 2010-12-27 NOTE — Assessment & Plan Note (Signed)
[place]                         ELECTROPHYSIOLOGY OFFICE NOTE   NAME:Jennifer [name], Jennifer [name]                     MRN:          [id number]  DATE:01/29/2008                            DOB:          [birth year]    Ms. Dohn is referred today by Dr. Linford Arnold for evaluation of  palpitations and documented SVT.  The patient is a very pleasant 45-year-  old woman who has a history of tachy palpitations dating back to  approximately 10 years.  These, however, have increased in frequency and  severity over the last year or so, prompting an EP study done back in  January by Dr. Jeanne Ivan at [facility].  I do not have the actual  procedure note from this, but according to the patient, she was told  that her heart racing could not be demonstrated during the electrical  test.  The patient had subsequently been rehospitalized at [facility] and was told by one of the electrophysiologists there (Dr.  Alycia Rossetti) that she had atrial tachycardia.  She was at that time recommended  to have a repeat EP study and ablation done.  The patient has worn a  cardiac monitor in the past and this demonstrates an episode of atrial  tachycardia at a rate of about 160 beats per minute.  There is initial  spontaneous initiation of this and spontaneous termination back to sinus  rhythm.  This is not sinus tachycardia, but rather atrial tachycardia.  Despite this, the patient has never had frank syncope with the episodes,  and while she does have some chest pressure and shortness of breath, she  does not have coronary disease.  At other times, she feels that her  heart is racing at rates of up to 110 or 115 beats per minute, and these  are also associated with fatigue and dizziness.  She can clearly  distinguish between these different heart rates.  She does, however, get  dizzy with episodes of mild heart racing.  She notes that with exercise,  she has felt worse, and at other  times, shehas taken beta-blockers with  drops in her blood pressure.  Additional past medical history is notable  for chronic nausea and vomiting.  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  She had a history of duodenitis.  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   SOCIAL HISTORY:  The patient has a history of tobacco use, but denies  smoking now.  She denies alcohol use or recreational drug use.  She  previously worked as a Network engineer.   FAMILY HISTORY:  Notable for coronary artery disease, depression,  diabetes, and hypertension.   CURRENT MEDICATIONS:  1. Nasonex spray.  2. Asmanex.  3. Prevacid.  4. Multivitamins.   Previously, she had been on Bystolic, but is not presently.   PHYSICAL EXAMINATION:  GENERAL:  She is a pleasant well-appearing 45-  year-old woman in  no acute distress.  VITAL SIGNS:  The blood pressure today was 126/80, the pulse was 100 and  regular, and the respirations are 18.  Weight is 172 pounds.  HEENT:  Normocephalic and atraumatic.  Pupils are equal and round.  The  oropharynx is moist.  Sclerae anicteric.  NECK:  No jugular venous distention.  No thyromegaly.  Trachea is  midline.  Carotids are 2+ and symmetric.  LUNGS:  Clear bilaterally to auscultation.  No wheezes, rales, or  rhonchi are present.  There is no increased work of breathing.  CARDIOVASCULAR:  Regular rate and rhythm, with normal S1 and S2.  There  are no murmurs, rubs, or gallops that I can appreciate.  PMI is not  enlarged nor is it displaced.  ABDOMINAL:  Soft, nontender, and nondistended.  There is no  organomegaly.  Bowel sounds are present.  There is no rebound or  guarding.  EXTREMITIES:  No cyanosis, clubbing, or edema.  Pulses are 2+ and  symmetric.  NEUROLOGIC:  Alert and oriented x3.  Cranial nerves intact.    EKG demonstrates sinus tachycardia at 104 beats per minute.  She has a  right bundle-branch block.   IMPRESSION:  1. Symptomatic tachy palpitations, both secondary to sinus tachycardia      as well as clear-cut atrial tachycardia.  2. Probable autonomic dysfunction.   DISCUSSION:  I have discussed the treatment options with the patient.  I  have recommended a period of watchful waiting.  I have recommended  increasing salt and fluid in her diet.  I have recommended that she  start walking on a regular basis.  All of the above have been shown to  help people with evidence of autonomic dysfunction.  With regard to the  patient's atrial tachycardia at 160 beats per minute, I recommended that  the patient continue a period of watchful waiting with this.  I have  reassured her that her tachycardia is not life threatening, and because  she has already had a negative EP study, I think doing an additional EP  study would very likely be unhelpful.  It may be that as time goes on,  her palpitations and her atrial tachycardia worsens and becomes more  incessant, and if this were the case, then ablation would be  recommended.  As an alternative, if her atrial tachycardia were to  worsen, consideration for the initiation of flecainide therapy at 50-100  mg twice a day would also be a very strong consideration.  I will plan  to see the patient back in several months sooner should her symptoms  worsen.     Doylene Canning. Ladona Ridgel, MD  Electronically Signed    GWT/MedQ  DD: 01/29/2008  DT: 01/30/2008  Job #: [id number]   cc:   Nani Gasser, M.D.

## 2010-12-28 ENCOUNTER — Emergency Department (HOSPITAL_BASED_OUTPATIENT_CLINIC_OR_DEPARTMENT_OTHER)
Admission: EM | Admit: 2010-12-28 | Discharge: 2010-12-28 | Disposition: A | Discharge status: Home / Self Care | Payer: Medicare Other | Attending: Emergency Medicine | Admitting: Emergency Medicine

## 2010-12-28 ENCOUNTER — Telehealth: Payer: Self-pay | Admitting: Family Medicine

## 2010-12-28 ENCOUNTER — Emergency Department (INDEPENDENT_AMBULATORY_CARE_PROVIDER_SITE_OTHER): Payer: Medicare Other

## 2010-12-28 DIAGNOSIS — Z79899 Other long term (current) drug therapy: Secondary | ICD-10-CM | POA: Insufficient documentation

## 2010-12-28 DIAGNOSIS — M79609 Pain in unspecified limb: Secondary | ICD-10-CM | POA: Insufficient documentation

## 2010-12-28 DIAGNOSIS — G8929 Other chronic pain: Secondary | ICD-10-CM | POA: Insufficient documentation

## 2010-12-28 DIAGNOSIS — K219 Gastro-esophageal reflux disease without esophagitis: Secondary | ICD-10-CM | POA: Insufficient documentation

## 2010-12-28 DIAGNOSIS — I10 Essential (primary) hypertension: Secondary | ICD-10-CM | POA: Insufficient documentation

## 2010-12-28 LAB — CBC
HCT: 36.9 % (ref 36.0–46.0)
Hemoglobin: 12.7 g/dL (ref 12.0–15.0)
MCH: 30.5 pg (ref 26.0–34.0)
MCHC: 34.4 g/dL (ref 30.0–36.0)
MCV: 88.5 fL (ref 78.0–100.0)
Platelets: 207 10*3/uL (ref 150–400)
RBC: 4.17 MIL/uL (ref 3.87–5.11)
RDW: 12.9 % (ref 11.5–15.5)
WBC: 7.2 10*3/uL (ref 4.0–10.5)

## 2010-12-28 LAB — COMPREHENSIVE METABOLIC PANEL
ALT: 21 U/L (ref 0–35)
AST: 17 U/L (ref 0–37)
Albumin: 3.7 g/dL (ref 3.5–5.2)
Alkaline Phosphatase: 59 U/L (ref 39–117)
BUN: 9 mg/dL (ref 6–23)
CO2: 23 mEq/L (ref 19–32)
Calcium: 9.1 mg/dL (ref 8.4–10.5)
Chloride: 104 mEq/L (ref 96–112)
Creatinine, Ser: <0.47 mg/dL (ref 0.4–1.2)
Glucose, Bld: 126 mg/dL — ABNORMAL HIGH (ref 70–99)
Potassium: 3.3 mEq/L — ABNORMAL LOW (ref 3.5–5.1)
Sodium: 138 mEq/L (ref 135–145)
Total Bilirubin: 0.3 mg/dL (ref 0.3–1.2)
Total Protein: 7 g/dL (ref 6.0–8.3)

## 2010-12-28 LAB — DIFFERENTIAL
Basophils Absolute: 0 10*3/uL (ref 0.0–0.1)
Basophils Relative: 1 % (ref 0–1)
Eosinophils Absolute: 0.2 10*3/uL (ref 0.0–0.7)
Eosinophils Relative: 2 % (ref 0–5)
Lymphocytes Relative: 21 % (ref 12–46)
Lymphs Abs: 1.5 10*3/uL (ref 0.7–4.0)
Monocytes Absolute: 0.6 10*3/uL (ref 0.1–1.0)
Monocytes Relative: 8 % (ref 3–12)
Neutro Abs: 5 10*3/uL (ref 1.7–7.7)
Neutrophils Relative %: 69 % (ref 43–77)

## 2010-12-28 NOTE — Telephone Encounter (Signed)
Call patient: She thinks she has a blood clot or a pulmonary embolism because she has had recent surgery which certainly would put her at risk then she needs to get emergency room.

## 2010-12-28 NOTE — Telephone Encounter (Signed)
Pt called and is not feeling well.  Wants a D-Dimer done and wants to go to [facility] to have done today.  Upper thigh swollen, chest discomfort but pt says not heart related, RT leg hurting #8-9/10.  Feels nauseted.  Taking tylenol Junior 160mg  per tab (taking 3 at a time).  Had surgery on 12-15-10.  Called and spoke with the med level provider.  Could never get out of patient what the mid-level provdier told her to do since recent surgery.  Please advise.... Plan:  Routed to Dr. Marlyne Beards, LPN [facility] Dimes

## 2010-12-28 NOTE — Telephone Encounter (Signed)
Pt notified after speaking with Dr. Linford Arnold.  Had to [facility] instructing pt to go to the ED for further workup regarding her symptoms and the fact that she had recent surgery.  Need to R/O PE, DVT, or infection from recent surgery. Jarvis Newcomer, LPN [facility] Dimes

## 2011-01-05 ENCOUNTER — Telehealth: Payer: Self-pay | Admitting: Family Medicine

## 2011-01-05 ENCOUNTER — Ambulatory Visit (INDEPENDENT_AMBULATORY_CARE_PROVIDER_SITE_OTHER): Payer: Medicare Other | Admitting: Family Medicine

## 2011-01-05 ENCOUNTER — Ambulatory Visit
Admission: RE | Admit: 2011-01-05 | Discharge: 2011-01-05 | Disposition: A | Discharge status: Home / Self Care | Payer: Medicare Other | Source: Ambulatory Visit | Attending: Family Medicine | Admitting: Family Medicine

## 2011-01-05 ENCOUNTER — Emergency Department (HOSPITAL_COMMUNITY)
Admission: EM | Admit: 2011-01-05 | Discharge: 2011-01-05 | Disposition: A | Discharge status: Home / Self Care | Payer: Medicare Other | Attending: Emergency Medicine | Admitting: Emergency Medicine

## 2011-01-05 ENCOUNTER — Emergency Department (HOSPITAL_COMMUNITY): Payer: Medicare Other

## 2011-01-05 VITALS — BP 132/94 | HR 90 | Temp 99.1°F | Ht 62.0 in | Wt 177.0 lb

## 2011-01-05 DIAGNOSIS — I10 Essential (primary) hypertension: Secondary | ICD-10-CM | POA: Insufficient documentation

## 2011-01-05 DIAGNOSIS — N949 Unspecified condition associated with female genital organs and menstrual cycle: Secondary | ICD-10-CM

## 2011-01-05 DIAGNOSIS — K219 Gastro-esophageal reflux disease without esophagitis: Secondary | ICD-10-CM | POA: Insufficient documentation

## 2011-01-05 DIAGNOSIS — Z79899 Other long term (current) drug therapy: Secondary | ICD-10-CM | POA: Insufficient documentation

## 2011-01-05 DIAGNOSIS — R0602 Shortness of breath: Secondary | ICD-10-CM

## 2011-01-05 DIAGNOSIS — R102 Pelvic and perineal pain: Secondary | ICD-10-CM

## 2011-01-05 DIAGNOSIS — R142 Eructation: Secondary | ICD-10-CM | POA: Insufficient documentation

## 2011-01-05 DIAGNOSIS — R05 Cough: Secondary | ICD-10-CM | POA: Insufficient documentation

## 2011-01-05 DIAGNOSIS — R143 Flatulence: Secondary | ICD-10-CM | POA: Insufficient documentation

## 2011-01-05 DIAGNOSIS — G8929 Other chronic pain: Secondary | ICD-10-CM | POA: Insufficient documentation

## 2011-01-05 DIAGNOSIS — R0789 Other chest pain: Secondary | ICD-10-CM | POA: Insufficient documentation

## 2011-01-05 DIAGNOSIS — R141 Gas pain: Secondary | ICD-10-CM | POA: Insufficient documentation

## 2011-01-05 DIAGNOSIS — R059 Cough, unspecified: Secondary | ICD-10-CM | POA: Insufficient documentation

## 2011-01-05 DIAGNOSIS — F411 Generalized anxiety disorder: Secondary | ICD-10-CM | POA: Insufficient documentation

## 2011-01-05 DIAGNOSIS — J40 Bronchitis, not specified as acute or chronic: Secondary | ICD-10-CM | POA: Insufficient documentation

## 2011-01-05 DIAGNOSIS — M7989 Other specified soft tissue disorders: Secondary | ICD-10-CM | POA: Insufficient documentation

## 2011-01-05 LAB — POCT PREGNANCY, URINE: Preg Test, Ur: NEGATIVE

## 2011-01-05 LAB — CBC WITH DIFFERENTIAL/PLATELET
HCT: 39.6 % (ref 36.0–46.0)
Hemoglobin: 13.3 g/dL (ref 12.0–15.0)
Lymphocytes Relative: 25 % (ref 12–46)
Lymphs Abs: 1.5 10*3/uL (ref 0.7–4.0)
MCH: 30.9 pg (ref 26.0–34.0)
MCHC: 33.6 g/dL (ref 30.0–36.0)
MCV: 92 fL (ref 78.0–100.0)
Monocytes Absolute: 0.3 10*3/uL (ref 0.1–1.0)
Monocytes Relative: 5 % (ref 3–12)
Neutro Abs: 4 10*3/uL (ref 1.7–7.7)
Neutrophils Relative %: 70 % (ref 43–77)
Platelets: 215 10*3/uL (ref 150–400)
RBC: 4.31 MIL/uL (ref 3.87–5.11)
RDW: 13.2 % (ref 11.5–15.5)
WBC: 5.8 10*3/uL (ref 4.0–10.5)

## 2011-01-05 LAB — POCT I-STAT, CHEM 8
BUN: 9 mg/dL (ref 6–23)
Calcium, Ion: 1.17 mmol/L (ref 1.12–1.32)
Chloride: 105 mEq/L (ref 96–112)
Creatinine, Ser: 0.6 mg/dL (ref 0.4–1.2)
Glucose, Bld: 121 mg/dL — ABNORMAL HIGH (ref 70–99)
HCT: 39 % (ref 36.0–46.0)
Hemoglobin: 13.3 g/dL (ref 12.0–15.0)
Potassium: 3.4 mEq/L — ABNORMAL LOW (ref 3.5–5.1)
Sodium: 145 mEq/L (ref 135–145)
TCO2: 25 mmol/L (ref 0–100)

## 2011-01-05 MED ORDER — IOHEXOL 300 MG/ML  SOLN
75.0000 mL | Freq: Once | INTRAMUSCULAR | Status: AC | PRN
  Administered 2011-01-05: 75 mL via INTRAVENOUS

## 2011-01-05 NOTE — Progress Notes (Signed)
  Subjective:    Patient ID: Jennifer [name], female    DOB: Sep 24, 1965, 45 y.o.   MRN: [id number]  HPI She think she has PNA. She feels SOB.  Feels it is getting worse. Last night felt like her throat clamped down. Went to the ED for leg pain this week and did a doppler and she had not blood clot but says her LN were enlarged in the right groin. She is also having pain under her left axilla. She is having a lot of chest discomfort. Feels like she did when have Pneumonia. 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  Thinks the stitches may have popped because she is getting some discomfort and noticed a little blood as well.  Her voice is really raspy today. She feels really weak today.  She has been using her inspirometer post surgery. She feels all her LN are swollen over her body. Slight cough, though she has a baseline cough. Did feel she brought up some phlegm this AM. She feels she has been running a low grade temp.   Review of Systems     Objective:   Physical Exam  Constitutional: She is oriented to person, place, and time. She appears well-developed and well-nourished.  HENT:  Head: Normocephalic and atraumatic.  Right Ear: External ear normal.  Left Ear: External ear normal.  Nose: Nose normal.  Mouth/Throat: Oropharynx is clear and moist.       TMs and canals are normal bilateraly.   Cardiovascular: Normal rate, regular rhythm and normal heart sounds.   Pulmonary/Chest: Effort normal and breath sounds normal.       Tender under the left axilla.   Neurological: She is alert and oriented to person, place, and time.  Skin: Skin is warm and dry.  Psychiatric: She has a normal mood and affect. Her behavior is normal. Judgment and thought content normal.          Assessment & Plan:  Shortness of breath- is possible she has an infection especially with a low-grade temp and feeling like her lymph nodes are swollen. I will get a CBC and a chest x-ray today to  rule out pneumonia. Her pulse ox was reassuring. Her lung exam was normal. She also has vocal cord dysfunction and this could be flaring and also causing her shortness of breath.   Perineal pain- I did recommend that she get back to see her GYN surgeons and she has noticed some tenderness in the area of her sutures as was a little bit of bloody drainage which she is not sure is coming from that area that could be. They use dissolvable sutures and usually around 3 weeks instructed to salt. There is still some suture material repair it is irritating this really needs to be evaluated by the surgeon. I also recommend that it be evaluated to rule out infection, especially since she did have some enlarged nodes in the right groin on the ultrasound that she had the emergency department a couple days ago.

## 2011-01-05 NOTE — Telephone Encounter (Addendum)
Call patient: Chest x-ray shows bronchitis. A definitive antibiotic to her pharmacy. She has such acute salicylate allergies that you'll need to ask her what she feels that she can take safely. Let me know and I'll send it to her pharmacy. Complete blood count was normal.

## 2011-01-05 NOTE — Patient Instructions (Signed)
We will call you with the Chest xray results.   

## 2011-01-05 NOTE — Telephone Encounter (Signed)
LMOM for pt at the number listed in the system.  Told pt that it is reasonable to call an AB treatment since she has such terrible allergies.  Told to call Friday morning and leave a detailed mess for triage of what antibiotic she can take and the pharm name/location and we will get sent tomorrow. Pending call back from the patient. Jarvis Newcomer, LPN [facility] Dimes

## 2011-01-06 ENCOUNTER — Telehealth: Payer: Self-pay | Admitting: Family Medicine

## 2011-01-06 ENCOUNTER — Encounter: Payer: Self-pay | Admitting: Family Medicine

## 2011-01-06 MED ORDER — MOXIFLOXACIN HCL 400 MG PO TABS: 400.0000 mg | ORAL_TABLET | Freq: Every day | ORAL | Status: AC

## 2011-01-06 NOTE — Telephone Encounter (Signed)
Pt returned the call from yesterday.  She has 4 pills of avelox 400 mg at home and would like this antibiotic to go along with the 4 pills she already has at home.  Usin inhaler (xopenex) and using robitussin with guanfenesin and is okay to continue this regimen???  Please advise.  Uses [facility]. Plan"  Routed to Dr. Marlyne Beards, LPN [facility] Dimes

## 2011-01-06 NOTE — Telephone Encounter (Signed)
Pt notified that #5 Avelox 400mg  pills was sent to [facility] in [place] and to continue the xopenex inhaler as well as the robitussin guanfenesin.  Pt voiced understanding. Jarvis Newcomer, LPN Domingo Dimes

## 2011-01-06 NOTE — Telephone Encounter (Signed)
Okay I will call in a course of Avelox. Can continue the inhaler and guaifenesin.

## 2011-01-09 ENCOUNTER — Encounter: Payer: Self-pay | Admitting: Family Medicine

## 2011-01-12 ENCOUNTER — Encounter: Payer: Self-pay | Admitting: Family Medicine

## 2011-01-12 ENCOUNTER — Ambulatory Visit (INDEPENDENT_AMBULATORY_CARE_PROVIDER_SITE_OTHER): Payer: Medicare Other | Admitting: Family Medicine

## 2011-01-12 ENCOUNTER — Telehealth: Payer: Self-pay | Admitting: Family Medicine

## 2011-01-12 DIAGNOSIS — R109 Unspecified abdominal pain: Secondary | ICD-10-CM

## 2011-01-12 DIAGNOSIS — J40 Bronchitis, not specified as acute or chronic: Secondary | ICD-10-CM

## 2011-01-12 LAB — CBC WITH DIFFERENTIAL/PLATELET
Basophils Absolute: 0.1 10*3/uL (ref 0.0–0.1)
Basophils Relative: 1 % (ref 0–1)
Eosinophils Absolute: 0.2 10*3/uL (ref 0.0–0.7)
Eosinophils Relative: 3 % (ref 0–5)
HCT: 38.5 % (ref 36.0–46.0)
Hemoglobin: 13.2 g/dL (ref 12.0–15.0)
Lymphocytes Relative: 26 % (ref 12–46)
Lymphs Abs: 1.4 10*3/uL (ref 0.7–4.0)
MCH: 31.3 pg (ref 26.0–34.0)
MCHC: 34.3 g/dL (ref 30.0–36.0)
MCV: 91.1 fL (ref 78.0–100.0)
Monocytes Absolute: 0.5 10*3/uL (ref 0.1–1.0)
Monocytes Relative: 9 % (ref 3–12)
Neutro Abs: 3.3 10*3/uL (ref 1.7–7.7)
Neutrophils Relative %: 62 % (ref 43–77)
Platelets: 216 10*3/uL (ref 150–400)
RBC: 4.22 MIL/uL (ref 3.87–5.11)
RDW: 13.3 % (ref 11.5–15.5)
WBC: 5.3 10*3/uL (ref 4.0–10.5)

## 2011-01-12 LAB — COMPLETE METABOLIC PANEL WITH GFR
ALT: 21 U/L (ref 0–35)
AST: 18 U/L (ref 0–37)
Albumin: 4 g/dL (ref 3.5–5.2)
Alkaline Phosphatase: 51 U/L (ref 39–117)
BUN: 11 mg/dL (ref 6–23)
CO2: 25 mEq/L (ref 19–32)
Calcium: 10 mg/dL (ref 8.4–10.5)
Chloride: 106 mEq/L (ref 96–112)
Creat: 0.5 mg/dL (ref 0.40–1.20)
GFR, Est African American: >60 mL/min (ref >60)
GFR, Est Non African American: >60 mL/min (ref >60)
Glucose, Bld: 80 mg/dL (ref 70–99)
Potassium: 3.9 mEq/L (ref 3.5–5.3)
Sodium: 140 mEq/L (ref 135–145)
Total Bilirubin: 0.4 mg/dL (ref 0.3–1.2)
Total Protein: 6.3 g/dL (ref 6.0–8.3)

## 2011-01-12 IMAGING — CR DG CHEST 2V
2 series · 2 of 2 positions shown · non-contrast
Comparison: Chest radiograph 10/12/2009

CLINICAL DATA: Short of breath,

CHEST - 2 VIEW

[w chest pa]
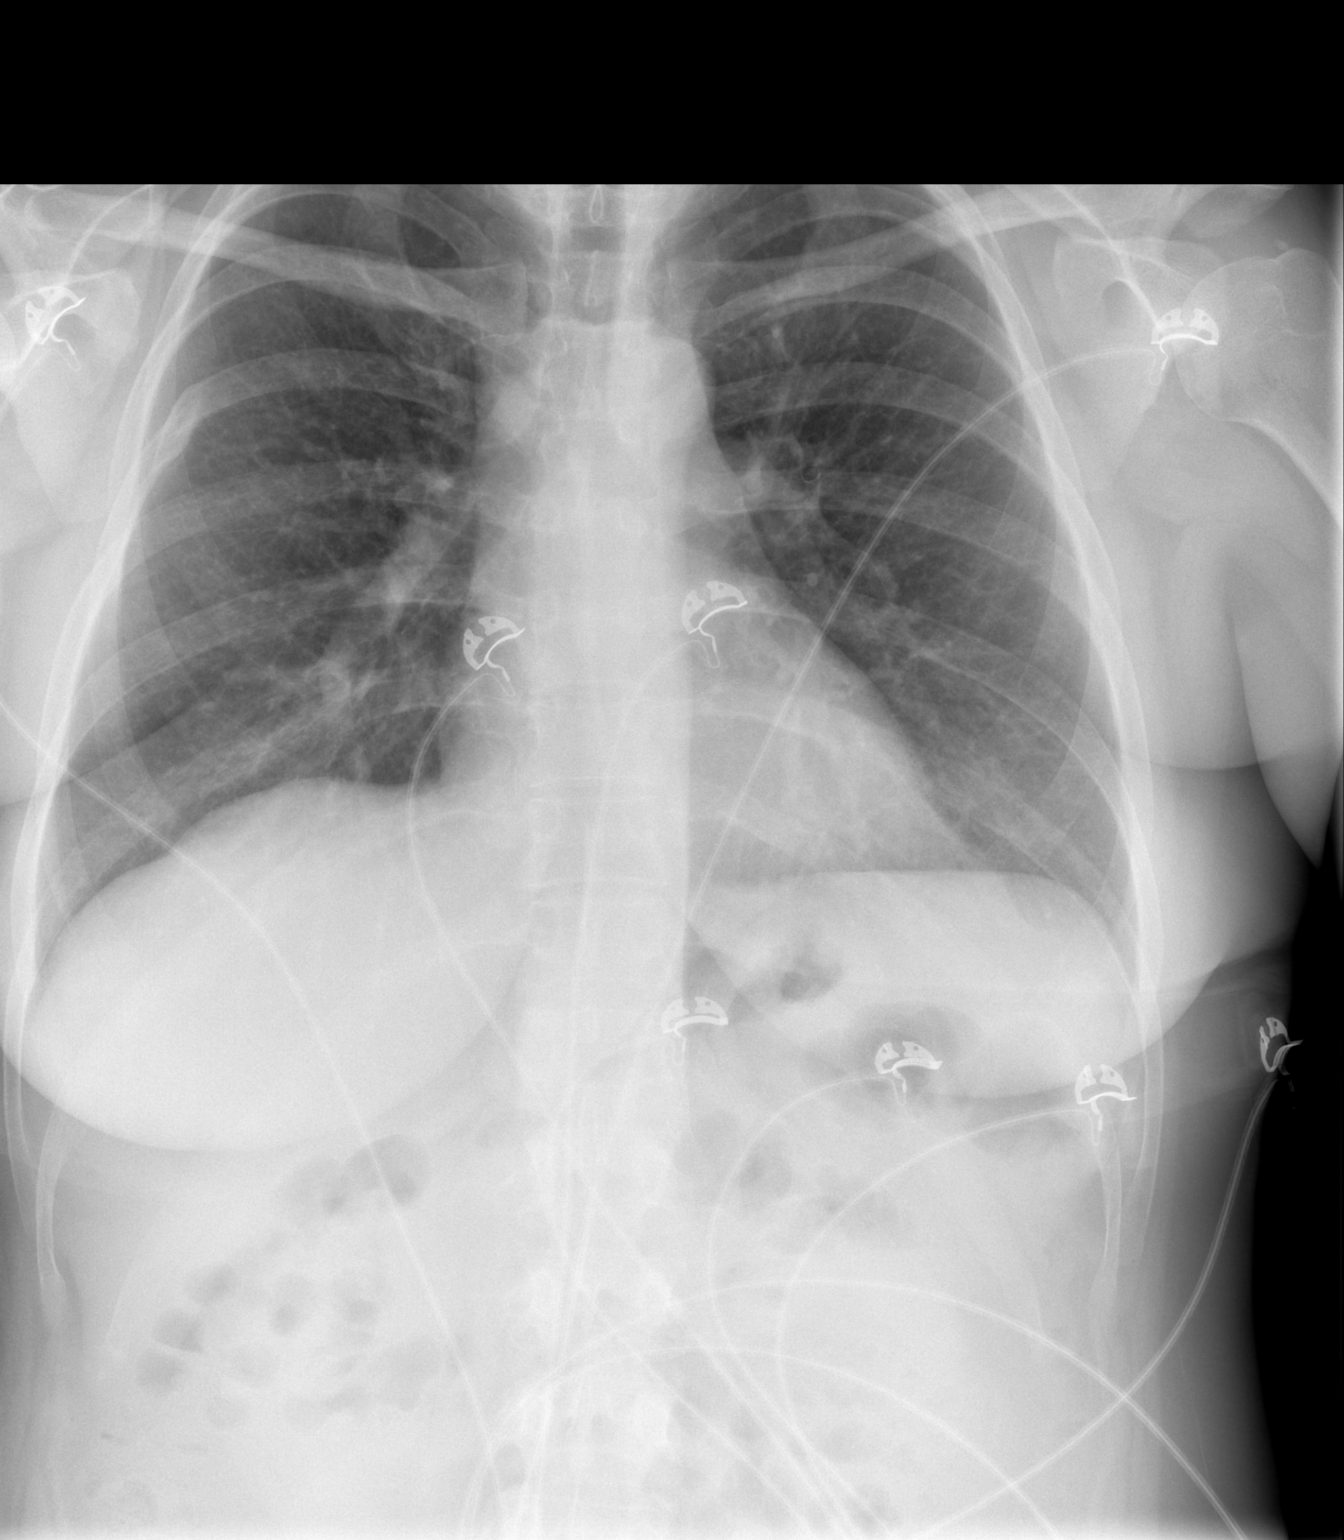

[w chest lat]
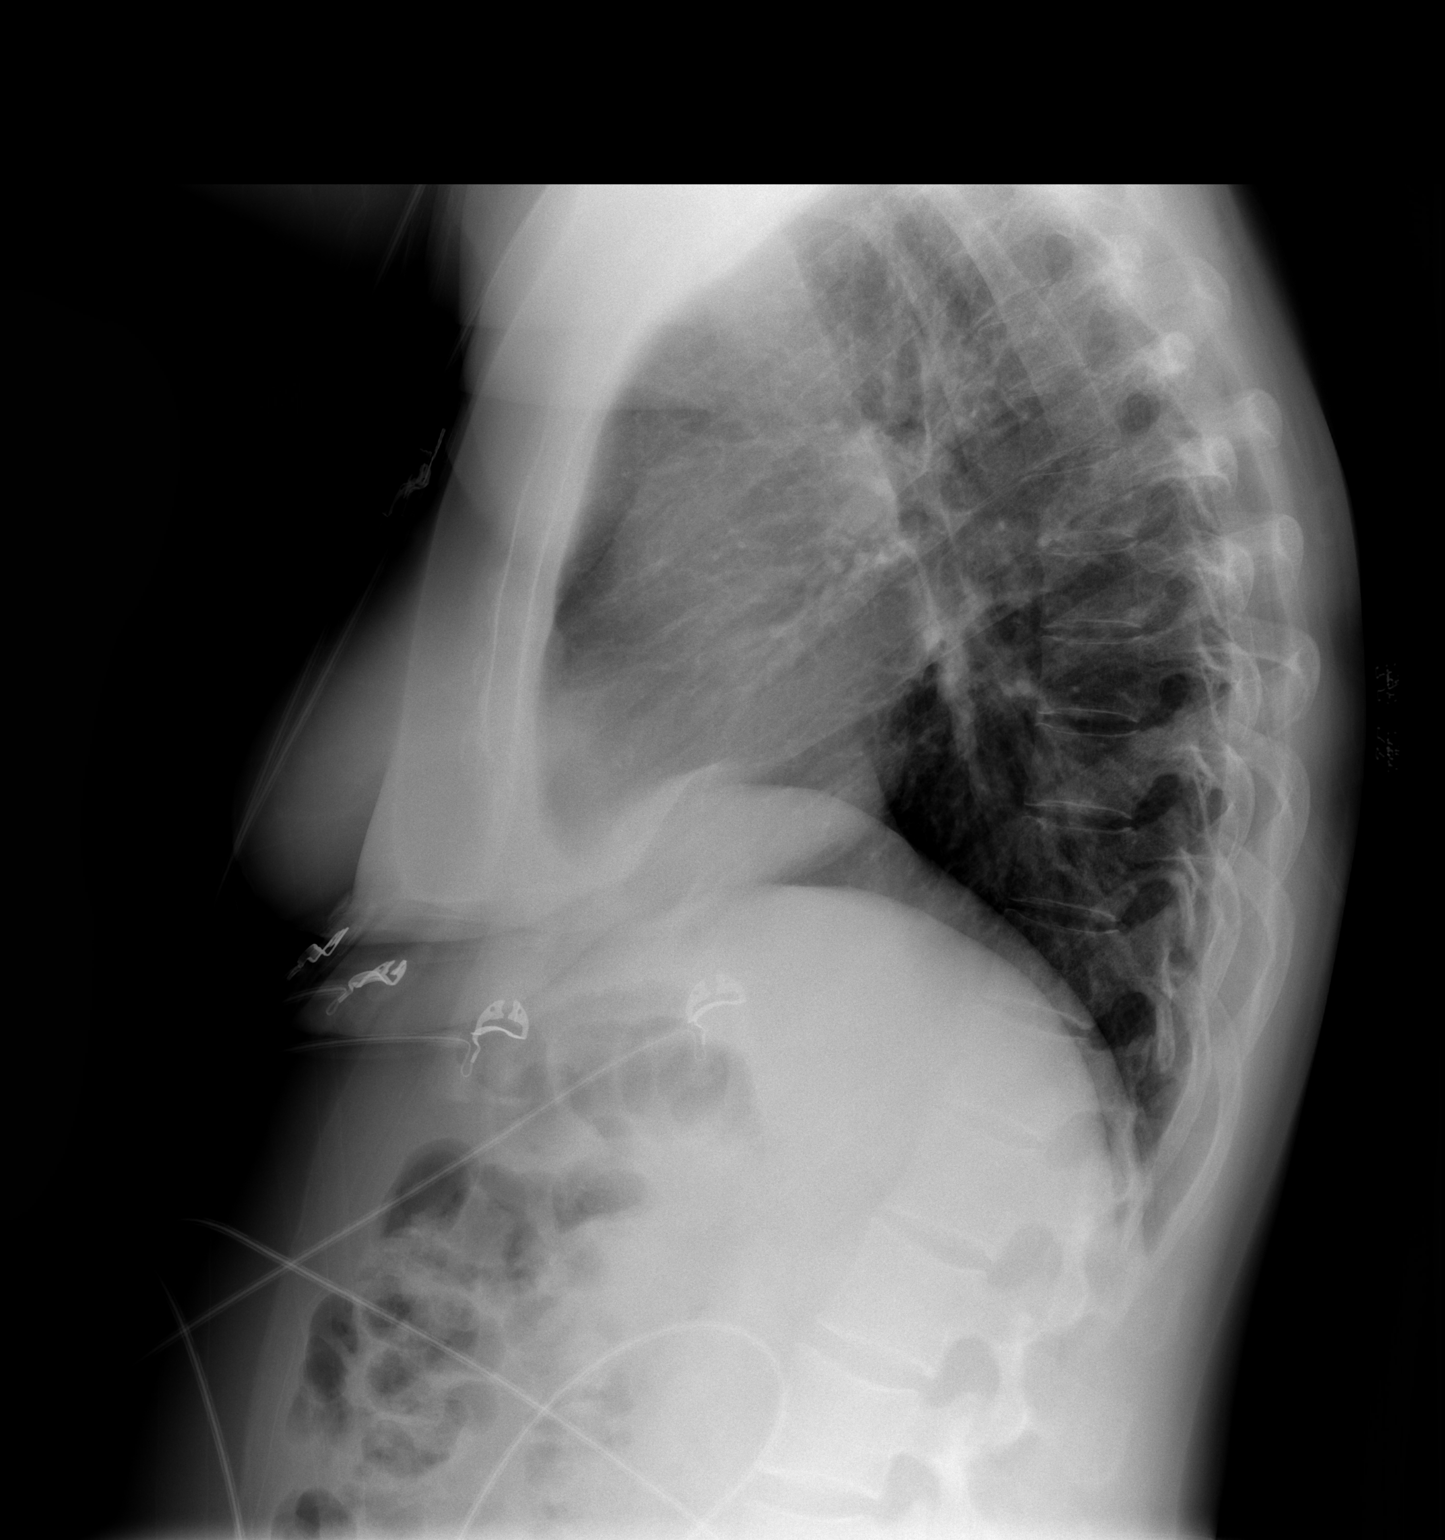

[2 of 2 positions shown; findings below may reference images not displayed]

FINDINGS: Normal mediastinum and heart silhouette.  Costophrenic
angles are clear.  No effusion, infiltrate, or  pneumothorax.
IMPRESSION: No acute cardiopulmonary process.

## 2011-01-12 MED ORDER — SUCRALFATE 1 GM/10ML PO SUSP: 1.0000 g | Freq: Four times a day (QID) | ORAL | Status: DC

## 2011-01-12 MED ORDER — SUCRALFATE 1 G PO TABS: 1.0000 g | ORAL_TABLET | Freq: Four times a day (QID) | ORAL | Status: DC

## 2011-01-12 NOTE — Patient Instructions (Signed)
We will call you with the stat blood work.  Make sure we have a cell phone for you.

## 2011-01-12 NOTE — Telephone Encounter (Signed)
Not sure why encounter open.  Closed. Jarvis Newcomer, LPN Dom[facility] Dimes

## 2011-01-12 NOTE — Telephone Encounter (Signed)
Pt was informed that we would call the carafate liquid suspension 1gm/54ml QID.  Sent the script to [facility]. Gave pt instructions that this was probable gastritis steming from the recent ab tx and that it wasn't from her surgery.  CBC/CMP normal.  Pt voiced understanding. Jarvis Newcomer, LPN Domingo Dimes

## 2011-01-12 NOTE — Progress Notes (Signed)
  Subjective:    Patient ID: Jennifer [name], female    DOB: 1965-09-06, 45 y.o.   MRN: [id number]  HPI I saw her for bronchitis for 8 days ago.  The day after she was here she went to the ED for SOB and was given an inhaler.  Completed all the Avelox.  Took 9 days worth.  Says she still feels some SOB and some stomach discomfort.  Says her stomach feels like someone has punched her.  Hasn't felt well.  Her oncologist felt that her perineum was was healing well.  Now feeling nauseated.  Cough is better. She feels like someone is punching her in the stomach.  She has not been eating or drinking a lot.     Review of Systems     Objective:   Physical Exam  Constitutional: She is oriented to person, place, and time. She appears well-developed and well-nourished.  HENT:  Head: Normocephalic and atraumatic.  Right Ear: External ear normal.  Left Ear: External ear normal.  Nose: Nose normal.  Mouth/Throat: Oropharynx is clear and moist.       TMs and canals are clear.   Eyes: Conjunctivae and EOM are normal. Pupils are equal, round, and reactive to light.  Neck: Neck supple. No thyromegaly present.  Cardiovascular: Normal rate, regular rhythm, normal heart sounds and intact distal pulses.   Pulmonary/Chest: Effort normal and breath sounds normal.  Abdominal: Soft. Bowel sounds are normal. She exhibits no distension and no mass. There is tenderness. There is no rebound and no guarding.       Tenderness in teh epigatrum and the Left upper quadrant.   Musculoskeletal: She exhibits edema.  Lymphadenopathy:    She has no cervical adenopathy.  Neurological: She is alert and oriented to person, place, and time.  Skin: Skin is warm and dry.  Psychiatric: She has a normal mood and affect.          Assessment & Plan:   bronchitis-overall I think she has improved. She is still having some shortness of breath but this is improved significantly.It may take another week or her to completely  recover. She did have bronchitis confirmed on a chest x-ray. Her peak flow is at goal at 550 today. Her personal best is 600.  Abdominal pain-epigastric and right upper quadrant. I think that this could be gastritis from taking antibiotics for so long. I doubt that this is anything acute. I will get a CBC with a differential acutely as well as a CMP stat. If these come back normal then I recommend some type of PPI or H2 blocker. And see if her pain gets better. She is worried that she has a fever but truly her temperature is normal. There she reports that when her fever goes above 99.3 that it is a true fever for her. She did take some Tylenol this morning. She also needs to work on hydrating herself. She's been nauseated she has not been vomiting and part of the abdominal pain could be dehydration itself. I don't see any red flag symptoms. If her CBC is elevated then we can consider further imaging of her abdomen with a CT. Noted she does have a history of chronic abdominal pain.

## 2011-01-12 NOTE — Telephone Encounter (Signed)
Call pt: CBC and CMP is normal. I wouldl ike her to try carafated to coat her stomach tonight and see if helps her pain. I really think this could be gastritis from the antibiotics.

## 2011-01-19 ENCOUNTER — Telehealth: Payer: Self-pay | Admitting: *Deleted

## 2011-01-19 NOTE — Telephone Encounter (Signed)
[id number]

## 2011-01-20 NOTE — Telephone Encounter (Signed)
Spoke with pt and she is stating needs to see PW asap for worsening SOB and sleep issues. I advised nothing available today and so advised either go to ED, or we can see her in [facility] with RA next wk (she refuses [facility]). She took the [facility] appt with RA for 01/23/10 at 4 pm. I advised that she go to ED sooner if her symptoms persist or worsen. Pt verbalized understanding.

## 2011-01-24 ENCOUNTER — Ambulatory Visit: Payer: Medicare Other | Admitting: Pulmonary Disease

## 2011-01-28 ENCOUNTER — Emergency Department (HOSPITAL_BASED_OUTPATIENT_CLINIC_OR_DEPARTMENT_OTHER)
Admission: EM | Admit: 2011-01-28 | Discharge: 2011-01-28 | Disposition: A | Discharge status: Home / Self Care | Payer: Medicare Other | Attending: Emergency Medicine | Admitting: Emergency Medicine

## 2011-01-28 ENCOUNTER — Emergency Department (INDEPENDENT_AMBULATORY_CARE_PROVIDER_SITE_OTHER): Payer: Medicare Other

## 2011-01-28 DIAGNOSIS — Z79899 Other long term (current) drug therapy: Secondary | ICD-10-CM | POA: Insufficient documentation

## 2011-01-28 DIAGNOSIS — R51 Headache: Secondary | ICD-10-CM | POA: Insufficient documentation

## 2011-01-28 DIAGNOSIS — K219 Gastro-esophageal reflux disease without esophagitis: Secondary | ICD-10-CM | POA: Insufficient documentation

## 2011-01-28 DIAGNOSIS — I10 Essential (primary) hypertension: Secondary | ICD-10-CM | POA: Insufficient documentation

## 2011-01-28 DIAGNOSIS — G8929 Other chronic pain: Secondary | ICD-10-CM | POA: Insufficient documentation

## 2011-02-08 ENCOUNTER — Encounter: Payer: Self-pay | Admitting: Critical Care Medicine

## 2011-02-09 ENCOUNTER — Encounter: Payer: Self-pay | Admitting: Critical Care Medicine

## 2011-02-09 ENCOUNTER — Ambulatory Visit (INDEPENDENT_AMBULATORY_CARE_PROVIDER_SITE_OTHER): Payer: Medicare Other | Admitting: Critical Care Medicine

## 2011-02-09 DIAGNOSIS — G4733 Obstructive sleep apnea (adult) (pediatric): Secondary | ICD-10-CM

## 2011-02-09 DIAGNOSIS — M889 Osteitis deformans of unspecified bone: Secondary | ICD-10-CM

## 2011-02-09 DIAGNOSIS — C4499 Other specified malignant neoplasm of skin, unspecified: Secondary | ICD-10-CM

## 2011-02-09 DIAGNOSIS — C519 Malignant neoplasm of vulva, unspecified: Secondary | ICD-10-CM | POA: Insufficient documentation

## 2011-02-09 DIAGNOSIS — J383 Other diseases of vocal cords: Secondary | ICD-10-CM

## 2011-02-09 MED ORDER — ALBUTEROL SULFATE (2.5 MG/3ML) 0.083% IN NEBU: 2.5000 mg | INHALATION_SOLUTION | Freq: Four times a day (QID) | RESPIRATORY_TRACT | Status: DC | PRN

## 2011-02-09 NOTE — Progress Notes (Signed)
Subjective:    Patient ID: Jennifer [name], female    DOB: 1966/05/06, 45 y.o.   MRN: [id number]  HPI 45 yo WF Borderline personality disorder, anxiety, depression, severe GERD, VCD,PAT, no true asthma.  November 18, 2009 10:45 AM  More back pain over past weekend. If goes out gets tight and cough spell. Worse with wind blowing.  Worse night sleeping and pain into the back and shoulders and into the front. Using sinus rinse two times a day. On nasonex now , very hoarse and pn drip is present , pain into the back  sinus rinse will get green mucous.  April 21, 2010 9:38 AM  Had allergy testing in [place]. ? if lungs are worse. PFR was 600 now is less.  Snores more at night. episodes and will awaken. Snoring startles. Never did a sleep study done. ? if tolerate cpap. Few spells of increased dyspnea with walking . Will sweat if exerts self. More pain in lower back. Lives in apts. If smells lighter fliuid. smells are an issue. Chest pain from the smells. Is dyspneic in the home. Feels better in conditioned environments  Using patanase in past , ? if helped. Nose would swell and not breathe and use saline, patanase helps a bit. Eyes are chronically dry. Has tried nasonex and ? if benefits. Notes hoarseness and mucus out of throat. Throat is sore. Eating issues and food hanging up. ? if dilated. May Barretts esophagus. cough is more and is paroxysmal, or if agitated. Feels like mucus is in throat. Will occ cough up material but is hard to get up. Cannot tolerate PPI.August 11, 2010 11:19 AM  ?PNA. was in a dusty house and since that time is dyspneic and was on abx with green and brown  rx 750mg  levaquin and had shakes and sweating. two days ago. avelox 400mg /d rx given. not eating or drinking well  worse since 07/17/10. dx PNA. labs were done. on avelox now and did ok on this. did ok on this took 1out of 7 avelox  today: cough and sputum will come up and is wet. mucus is still green and brown. did have  green brown and blood  first of the month was like this, was having sinus pressure and pain  mold in the house and skin testing was positive for Mold  September 22, 2010 4:28 PM  CXR : neg. Was getting IV iron.  Still pain on R side. Still dyspneic. at night will awaken and catch the breath. Mucus is not discolored. Has pn drip.  ran out of nasonex 1 week.   02/09/2011  Back to Ed chest pain,  CXR: ?abn,  Bronchitis dx and rx abx per PCP.  Tight in chest ,  Felt light headed.   CT chest neg for PE.  Feels like throat will close off.    Review of Systems Constitutional:   No  weight loss, night sweats,  Fevers, chills, fatigue, lassitude. HEENT:   No headaches,  Difficulty swallowing,  Tooth/dental problems,  Sore throat,                No sneezing, itching, ear ache, nasal congestion, post nasal drip,   CV:  No chest pain,  Orthopnea, PND, swelling in lower extremities, anasarca, dizziness, palpitations  GI  No heartburn, indigestion, abdominal pain, nausea, vomiting, diarrhea, change in bowel habits, loss of appetite  Resp: No shortness of breath with exertion or at rest.  No excess mucus, no productive  cough,  No non-productive cough,  No coughing up of blood.  No change in color of mucus.  No wheezing.  No chest wall deformity  Skin: no rash or lesions.  GU: no dysuria, change in color of urine, no urgency or frequency.  No flank pain.  MS:  No joint pain or swelling.  No decreased range of motion.  No back pain.  Psych:  No change in mood or affect. No depression or anxiety.  No memory loss.     Objective:   Physical Exam Filed Vitals:   02/09/11 1409  BP: 130/80  Pulse: 98  Temp: 98.9 F (37.2 C)  Height: 5\' 2"  (1.575 m)  Weight: 178 lb (80.74 kg)  SpO2: 97%    Gen: Pleasant, well-nourished, in no distress,  normal affect  ENT: No lesions,  mouth clear,  oropharynx clear, no postnasal drip  Neck: No JVD, no TMG, no carotid bruits  Lungs: No use of accessory  muscles, no dullness to percussion, clear without rales or rhonchi  Cardiovascular: RRR, heart sounds normal, no murmur or gallops, no peripheral edema  Abdomen: soft and NT, no HSM,  BS normal  Musculoskeletal: No deformities, no cyanosis or clubbing  Neuro: alert, non focal  Skin: Warm, no lesions or rashes        Assessment & Plan:   VOCAL CORD DISORDER No evidence of acute sinusitis, overt  Evidence of VCD seen. Plan Use ativan prn No abx needed Pt given reassurance no other rx or studies seen    Updated Medication List Outpatient Encounter Prescriptions as of 02/09/2011  Medication Sig Dispense Refill  . Acetaminophen (TYLENOL JR MELTAWAYS) 160 MG TBDP Take 160 mg by mouth. Take as needed and as directed.       . dicyclomine (BENTYL) 10 MG/5ML syrup Take 20 mg by mouth as needed.        Marland Kitchen guaiFENesin (ROBITUSSIN) 100 MG/5ML liquid Take 200 mg by mouth as needed.        . Hypertonic Nasal Wash (SINUS RINSE BOTTLE KIT NA) Place into the nose daily. 1--2 times daily       . lansoprazole (PREVACID SOLUTAB) 30 MG disintegrating tablet 1-2 times daily       . levalbuterol (XOPENEX HFA) 45 MCG/ACT inhaler Inhale 1-2 puffs into the lungs every 6 (six) hours as needed.        . Misc. Devices (CANE) MISC by Does not apply route.        . ondansetron (ZOFRAN) 4 MG tablet Take 4 mg by mouth every 12 (twelve) hours as needed.        Marland Kitchen albuterol (PROVENTIL) (2.5 MG/3ML) 0.083% nebulizer solution Take 3 mLs (2.5 mg total) by nebulization every 6 (six) hours as needed for wheezing.  75 mL  6  . levalbuterol (XOPENEX) 0.63 MG/3ML nebulizer solution Take 1 ampule by nebulization every 4 (four) hours as needed.        Marland Kitchen LORazepam (ATIVAN) 1 MG tablet Take 1 mg by mouth every 8 (eight) hours.        . mometasone (NASONEX) 50 MCG/ACT nasal spray 2 sprays by Nasal route daily.        Marland Kitchen DISCONTD: sucralfate (CARAFATE) 1 GM/10ML suspension Take 10 mLs (1 g total) by mouth 4 (four) times daily.  Take 1 gm/33ml  1 hour prior to meals and at bedtime.  420 mL  0

## 2011-02-09 NOTE — Patient Instructions (Signed)
We do not have xopenex samples here,   You may use albuterol as needed in nebulizer Use ativan 1/2 tablet as needed for throat closure Pursue botox injections again A home sleep study will be obtained Double the prevacid : take one twice daily for two weeks then daily thereafter Return as needed

## 2011-02-11 NOTE — Assessment & Plan Note (Addendum)
No evidence of acute sinusitis, overt  Evidence of VCD seen. Plan Use ativan prn No abx needed Pt given reassurance no other rx or studies seen

## 2011-02-12 ENCOUNTER — Emergency Department (HOSPITAL_COMMUNITY)
Admission: EM | Admit: 2011-02-12 | Discharge: 2011-02-12 | Discharge status: Against Medical Advice | Payer: Medicare Other | Attending: Emergency Medicine | Admitting: Emergency Medicine

## 2011-02-12 DIAGNOSIS — I10 Essential (primary) hypertension: Secondary | ICD-10-CM | POA: Insufficient documentation

## 2011-02-12 DIAGNOSIS — R6889 Other general symptoms and signs: Secondary | ICD-10-CM | POA: Insufficient documentation

## 2011-02-12 DIAGNOSIS — Z0000 Encounter for general adult medical examination without abnormal findings: Secondary | ICD-10-CM | POA: Insufficient documentation

## 2011-02-23 ENCOUNTER — Telehealth: Payer: Self-pay | Admitting: Family Medicine

## 2011-02-23 DIAGNOSIS — M25519 Pain in unspecified shoulder: Secondary | ICD-10-CM

## 2011-02-23 DIAGNOSIS — M549 Dorsalgia, unspecified: Secondary | ICD-10-CM

## 2011-02-23 NOTE — Telephone Encounter (Signed)
Pt called and is experiencing shoulder problems.  I tried to call the pt back but she was not available and I had to [facility] for her.   Plan: Told the pt to schedule an office visit if this was indeed a new problem and she had never been seen for it before; especially since she was requesting an MRI. Jarvis Newcomer, LPN [facility]

## 2011-02-23 NOTE — Telephone Encounter (Signed)
Pt called back to speak with triage nurse and she was recommended to schedule an office visit if new problem, but when she called back she said she had originally had an MRI at [facility] some time ago.  When researching the chart it was actually done on 05-25-10, and it has been almost a year.  Pt is now complaining of the RT shoulder pain again.  At the originally visit when she had the MRI it was recommended for her to see PT or see ortho and pt states she didn't do either one.  Should pt not sched an office visit to be re-evaluated since there has been a lapse of time??  Please advise. Plan:  Routed to Dr. Linford Arnold. Jarvis Newcomer, LPN [facility]

## 2011-02-23 NOTE — Telephone Encounter (Signed)
If she wasn't to do PT then we can go ahead and refer.

## 2011-02-23 NOTE — Telephone Encounter (Signed)
Left message for the patient instructing her to call the office and let us know if she would like to do PT or be referred to a specialist. Jarvis Newcomer, LPN [facility]

## 2011-02-24 ENCOUNTER — Emergency Department (HOSPITAL_BASED_OUTPATIENT_CLINIC_OR_DEPARTMENT_OTHER)
Admission: EM | Admit: 2011-02-24 | Discharge: 2011-02-24 | Disposition: A | Discharge status: Home / Self Care | Payer: Medicare Other | Attending: Emergency Medicine | Admitting: Emergency Medicine

## 2011-02-24 ENCOUNTER — Other Ambulatory Visit: Payer: Self-pay

## 2011-02-24 ENCOUNTER — Encounter (HOSPITAL_BASED_OUTPATIENT_CLINIC_OR_DEPARTMENT_OTHER): Payer: Self-pay | Admitting: Emergency Medicine

## 2011-02-24 ENCOUNTER — Emergency Department (INDEPENDENT_AMBULATORY_CARE_PROVIDER_SITE_OTHER): Payer: Medicare Other

## 2011-02-24 DIAGNOSIS — I10 Essential (primary) hypertension: Secondary | ICD-10-CM | POA: Insufficient documentation

## 2011-02-24 DIAGNOSIS — G4730 Sleep apnea, unspecified: Secondary | ICD-10-CM | POA: Insufficient documentation

## 2011-02-24 DIAGNOSIS — R079 Chest pain, unspecified: Secondary | ICD-10-CM

## 2011-02-24 DIAGNOSIS — J45909 Unspecified asthma, uncomplicated: Secondary | ICD-10-CM | POA: Insufficient documentation

## 2011-02-24 DIAGNOSIS — K219 Gastro-esophageal reflux disease without esophagitis: Secondary | ICD-10-CM | POA: Insufficient documentation

## 2011-02-24 DIAGNOSIS — Z87891 Personal history of nicotine dependence: Secondary | ICD-10-CM | POA: Insufficient documentation

## 2011-02-24 DIAGNOSIS — E785 Hyperlipidemia, unspecified: Secondary | ICD-10-CM | POA: Insufficient documentation

## 2011-02-24 DIAGNOSIS — R0602 Shortness of breath: Secondary | ICD-10-CM | POA: Insufficient documentation

## 2011-02-24 LAB — TROPONIN I: Troponin I: <0.3 ng/mL (ref <0.30)
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
## 2011-02-24 NOTE — ED Notes (Signed)
Labs obtained via RT hand

## 2011-02-27 ENCOUNTER — Telehealth: Payer: Self-pay | Admitting: Family Medicine

## 2011-02-27 NOTE — Telephone Encounter (Signed)
Pt. Called and is experiencing ear pain behind it on the outside of ear.  Was wanting to get in this morning. Plan:  The front office had already told the pt we didn't have any appts this am, but pt wanted to talk with triage to see.   Plan:  Told pt she could come in this afternoon because that would be the first available appt., but pt wanted to check her schedule to first see what the best time for her would be.  Also gave options of open time frames for tomorrow schedule as well. Jennifer Newcomer, LPN [facility]

## 2011-02-28 NOTE — Telephone Encounter (Signed)
Pt notified to find out if she would like to be referred to a specialist or go to physical therapy for her issues.  Pt said she got tired of hurting so she went to [facility] ED and they did several xrays and diagnosed levoscoliosis.  Pt definitely does not want to go to PT and states she gets worse every time she goes to PT.  Is there someone who would specialize in the diagnosis??  Please advise. Plan:  Routed to Dr. Linford Arnold

## 2011-02-28 NOTE — Telephone Encounter (Signed)
Can either see ortho or go to PT.

## 2011-02-28 NOTE — Telephone Encounter (Signed)
Pt would like to be referred to [facility] area to an ortho specialist that specializes in levoscoliosis.  Pt prefers to be called to discuss the arrangements before they are decided upon or before appt/date/time is scheduled.  Told pt we'll pass that along to the referral person. Jennifer Newcomer, LPN [facility]

## 2011-03-06 ENCOUNTER — Encounter: Payer: Self-pay | Admitting: Critical Care Medicine

## 2011-03-06 ENCOUNTER — Emergency Department (HOSPITAL_COMMUNITY)
Admission: EM | Admit: 2011-03-06 | Discharge: 2011-03-06 | Discharge status: Against Medical Advice | Payer: Medicare Other | Attending: Emergency Medicine | Admitting: Emergency Medicine

## 2011-03-06 ENCOUNTER — Ambulatory Visit (INDEPENDENT_AMBULATORY_CARE_PROVIDER_SITE_OTHER): Payer: Medicare Other | Admitting: Critical Care Medicine

## 2011-03-06 ENCOUNTER — Telehealth: Payer: Self-pay | Admitting: Critical Care Medicine

## 2011-03-06 ENCOUNTER — Ambulatory Visit: Payer: Medicare Other | Admitting: Critical Care Medicine

## 2011-03-06 DIAGNOSIS — R61 Generalized hyperhidrosis: Secondary | ICD-10-CM | POA: Insufficient documentation

## 2011-03-06 DIAGNOSIS — R0609 Other forms of dyspnea: Secondary | ICD-10-CM | POA: Insufficient documentation

## 2011-03-06 DIAGNOSIS — F411 Generalized anxiety disorder: Secondary | ICD-10-CM | POA: Insufficient documentation

## 2011-03-06 DIAGNOSIS — J209 Acute bronchitis, unspecified: Secondary | ICD-10-CM

## 2011-03-06 DIAGNOSIS — R0989 Other specified symptoms and signs involving the circulatory and respiratory systems: Secondary | ICD-10-CM | POA: Insufficient documentation

## 2011-03-06 DIAGNOSIS — J328 Other chronic sinusitis: Secondary | ICD-10-CM

## 2011-03-06 DIAGNOSIS — J383 Other diseases of vocal cords: Secondary | ICD-10-CM

## 2011-03-06 MED ORDER — MOMETASONE FUROATE 50 MCG/ACT NA SUSP: 2.0000 | Freq: Every day | NASAL | Status: DC

## 2011-03-06 MED ORDER — MOXIFLOXACIN HCL 400 MG PO TABS: 400.0000 mg | ORAL_TABLET | Freq: Every day | ORAL | Status: AC

## 2011-03-06 NOTE — Assessment & Plan Note (Signed)
Most of current complaint is VCD,  Maybe mild bronchitis b ut major psych overlay Plan I need to speak with psych md, pt will sign consent avelox x 5 d Prn ativan for anxiety

## 2011-03-06 NOTE — Telephone Encounter (Signed)
Pt has an appt today at 1:30 with Dr. Delford Field. Pt aware. Carron Curie, CMA
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
## 2011-03-06 NOTE — Patient Instructions (Signed)
I would like to speak to your therapist Use lorazepam 1/2 to 1 at bedtime or with spells of severe shortness of breath Resume nasonex two sprays each nostril daily Take avelox one daily for 5 days. Use Sample Albuterol as needed Follow up with botox injection I will review apnea link results and call you

## 2011-03-07 ENCOUNTER — Ambulatory Visit: Payer: Medicare Other | Admitting: Adult Health

## 2011-03-07 ENCOUNTER — Telehealth: Payer: Self-pay | Admitting: Critical Care Medicine

## 2011-03-08 ENCOUNTER — Ambulatory Visit: Payer: Medicare Other | Admitting: Internal Medicine

## 2011-03-09 ENCOUNTER — Ambulatory Visit (INDEPENDENT_AMBULATORY_CARE_PROVIDER_SITE_OTHER): Payer: Medicare Other | Admitting: Pulmonary Disease

## 2011-03-09 DIAGNOSIS — R5383 Other fatigue: Secondary | ICD-10-CM

## 2011-03-09 DIAGNOSIS — R5381 Other malaise: Secondary | ICD-10-CM

## 2011-03-09 NOTE — Telephone Encounter (Signed)
I have been trying to reach patient to discuss her referral as she requested me to do but can not get a answer or a return phone call. Thanks, [employer]

## 2011-03-09 NOTE — Telephone Encounter (Signed)
I spoke to Dr Ferd Glassing of psych. He agrees that this pt is 99% psych issues and agrees to work more intensely on her to the point of possible commitment.

## 2011-03-09 NOTE — Progress Notes (Signed)
  Subjective:    Patient ID: Jennifer [name], female    DOB: 1965/08/25, 45 y.o.   MRN: [id number]  HPI  Home sleep study reviewed  Flow evaluation period 10h 10 min AHI  2/h ODI 3/h Mins with saturation < 88% was 14 mins This suggests NO evidence  of sleep disordered breathing. Mild sleep related hypoxia - please corelate clinically. Please note that home study can underestimate severity of sleep disordered breathing  Review of Systems     Objective:   Physical Exam        Assessment & Plan:   No problem-specific assessment & plan notes found for this encounter.

## 2011-03-13 ENCOUNTER — Encounter: Payer: Self-pay | Admitting: Internal Medicine

## 2011-03-14 ENCOUNTER — Encounter: Payer: Self-pay | Admitting: Internal Medicine

## 2011-03-14 ENCOUNTER — Encounter: Payer: Self-pay | Admitting: Pulmonary Disease

## 2011-03-14 ENCOUNTER — Ambulatory Visit (INDEPENDENT_AMBULATORY_CARE_PROVIDER_SITE_OTHER): Payer: Medicare Other | Admitting: Internal Medicine

## 2011-03-14 VITALS — BP 134/80 | HR 113 | Temp 98.4°F | Resp 20 | Ht 62.0 in | Wt 178.0 lb

## 2011-03-14 DIAGNOSIS — M5030 Other cervical disc degeneration, unspecified cervical region: Secondary | ICD-10-CM | POA: Insufficient documentation

## 2011-03-14 DIAGNOSIS — R000 Tachycardia, unspecified: Secondary | ICD-10-CM | POA: Insufficient documentation

## 2011-03-14 DIAGNOSIS — G8929 Other chronic pain: Secondary | ICD-10-CM

## 2011-03-14 DIAGNOSIS — M542 Cervicalgia: Secondary | ICD-10-CM

## 2011-03-14 DIAGNOSIS — E785 Hyperlipidemia, unspecified: Secondary | ICD-10-CM

## 2011-03-14 NOTE — Progress Notes (Signed)
  Subjective:    Patient ID: Jennifer [name], female    DOB: 14-Sep-1965, 45 y.o.   MRN: [id number]  HPI Pt presents to clinic to re-establish medical care and followup of multiple medical problems. Notes recent intermittent increase in heart rate. Denies skipping or trigger. No presyncope or syncope. Notes h/o short PR without definite wpw. Recalls past EP study unable to induce arrythmia. Recalls h/o hyperlipidemia not currently requiring medication. Sees gyn onc for vulvar pagets as well as H/O for fe def anemia previously requiring ?iron infusion. Last hgb in may reviewed nl. Continues to suffer from dysphagia despite egd dilatation several weeks ago. Anticipates progressing on to botox tx. Sees psychiatry and therapist regularly. Also notes chronic neck pain and is interested in pursuing PT. No other complaints.  Reviewed pmh, medications and allergies    Review of Systems see hpi     Objective:   Physical Exam  Nursing note and vitals reviewed. Constitutional: She appears well-developed and well-nourished. No distress.  HENT:  Head: Normocephalic and atraumatic.  Right Ear: Tympanic membrane, external ear and ear canal normal.  Left Ear: Tympanic membrane, external ear and ear canal normal.  Nose: Nose normal.  Mouth/Throat: Oropharynx is clear and moist. No oropharyngeal exudate.  Eyes: Conjunctivae are normal. No scleral icterus.  Neck: Neck supple. No thyromegaly present.  Cardiovascular: Regular rhythm, normal heart sounds and intact distal pulses.  Tachycardia present.  Exam reveals no gallop and no friction rub.   No murmur heard. Neurological: She is alert.  Skin: Skin is warm and dry. She is not diaphoretic.          Assessment & Plan:

## 2011-03-14 NOTE — Assessment & Plan Note (Signed)
EKG obtained demonstrates sinus rhythm 90 with short pr. No obvious delta wave. Pt believes may be mildly volume deplete. Increase po fluid intake.

## 2011-03-14 NOTE — Patient Instructions (Signed)
Please schedule chem7 (v58.69), lipid/lft (272.4) prior to next visit  

## 2011-03-14 NOTE — Assessment & Plan Note (Signed)
Obtain lipid/lft prior to next visit 

## 2011-03-14 NOTE — Assessment & Plan Note (Signed)
Begin PT. 

## 2011-03-20 ENCOUNTER — Ambulatory Visit: Payer: Medicare Other | Admitting: Critical Care Medicine

## 2011-03-27 ENCOUNTER — Emergency Department (HOSPITAL_COMMUNITY): Payer: Medicare Other

## 2011-03-27 ENCOUNTER — Emergency Department (HOSPITAL_COMMUNITY)
Admission: EM | Admit: 2011-03-27 | Discharge: 2011-03-27 | Disposition: A | Discharge status: Home / Self Care | Payer: Medicare Other | Attending: Emergency Medicine | Admitting: Emergency Medicine

## 2011-03-27 DIAGNOSIS — K589 Irritable bowel syndrome without diarrhea: Secondary | ICD-10-CM | POA: Insufficient documentation

## 2011-03-27 DIAGNOSIS — J45909 Unspecified asthma, uncomplicated: Secondary | ICD-10-CM | POA: Insufficient documentation

## 2011-03-27 DIAGNOSIS — R109 Unspecified abdominal pain: Secondary | ICD-10-CM | POA: Insufficient documentation

## 2011-03-27 LAB — URINALYSIS, ROUTINE W REFLEX MICROSCOPIC
Bilirubin Urine: NEGATIVE
Glucose, UA: NEGATIVE mg/dL
Hgb urine dipstick: NEGATIVE
Ketones, ur: NEGATIVE mg/dL
Leukocytes, UA: NEGATIVE
Nitrite: NEGATIVE
Protein, ur: NEGATIVE mg/dL
Specific Gravity, Urine: 1.022 (ref 1.005–1.030)
Urobilinogen, UA: 0.2 mg/dL (ref 0.0–1.0)
pH: 5.5 (ref 5.0–8.0)

## 2011-03-27 LAB — POCT PREGNANCY, URINE: Preg Test, Ur: NEGATIVE

## 2011-03-31 ENCOUNTER — Ambulatory Visit (INDEPENDENT_AMBULATORY_CARE_PROVIDER_SITE_OTHER): Payer: Medicare Other | Admitting: Internal Medicine

## 2011-03-31 ENCOUNTER — Encounter: Payer: Self-pay | Admitting: Internal Medicine

## 2011-03-31 DIAGNOSIS — F4310 Post-traumatic stress disorder, unspecified: Secondary | ICD-10-CM

## 2011-03-31 DIAGNOSIS — R1310 Dysphagia, unspecified: Secondary | ICD-10-CM

## 2011-03-31 DIAGNOSIS — B353 Tinea pedis: Secondary | ICD-10-CM

## 2011-03-31 MED ORDER — SUCRALFATE 1 GM/10ML PO SUSP: 1.0000 g | Freq: Four times a day (QID) | ORAL | Status: DC

## 2011-04-01 DIAGNOSIS — B353 Tinea pedis: Secondary | ICD-10-CM | POA: Insufficient documentation
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
## 2011-04-01 NOTE — Assessment & Plan Note (Signed)
Ongoing treatment with psychiatry and therapist.

## 2011-04-01 NOTE — Assessment & Plan Note (Signed)
Begin lotrimin ultra bid.

## 2011-04-01 NOTE — Assessment & Plan Note (Signed)
With accompanying odynophagia begin carafate suspenion. Pt will contact specialist group to update them on her recent sx's. States understanding.

## 2011-04-08 ENCOUNTER — Encounter (HOSPITAL_BASED_OUTPATIENT_CLINIC_OR_DEPARTMENT_OTHER): Payer: Self-pay | Admitting: Emergency Medicine

## 2011-04-08 ENCOUNTER — Emergency Department (HOSPITAL_BASED_OUTPATIENT_CLINIC_OR_DEPARTMENT_OTHER)
Admission: EM | Admit: 2011-04-08 | Discharge: 2011-04-08 | Disposition: A | Discharge status: Home / Self Care | Payer: Medicare Other | Attending: Emergency Medicine | Admitting: Emergency Medicine

## 2011-04-08 DIAGNOSIS — M25519 Pain in unspecified shoulder: Secondary | ICD-10-CM | POA: Insufficient documentation

## 2011-04-08 DIAGNOSIS — M549 Dorsalgia, unspecified: Secondary | ICD-10-CM | POA: Insufficient documentation

## 2011-04-08 DIAGNOSIS — E785 Hyperlipidemia, unspecified: Secondary | ICD-10-CM | POA: Insufficient documentation

## 2011-04-08 DIAGNOSIS — J45909 Unspecified asthma, uncomplicated: Secondary | ICD-10-CM | POA: Insufficient documentation

## 2011-04-08 DIAGNOSIS — N390 Urinary tract infection, site not specified: Secondary | ICD-10-CM

## 2011-04-08 DIAGNOSIS — K219 Gastro-esophageal reflux disease without esophagitis: Secondary | ICD-10-CM | POA: Insufficient documentation

## 2011-04-08 DIAGNOSIS — I10 Essential (primary) hypertension: Secondary | ICD-10-CM | POA: Insufficient documentation

## 2011-04-08 LAB — URINE MICROSCOPIC-ADD ON

## 2011-04-08 LAB — URINALYSIS, ROUTINE W REFLEX MICROSCOPIC
Glucose, UA: NEGATIVE mg/dL
Ketones, ur: 15 mg/dL — AB
Nitrite: NEGATIVE
Protein, ur: 100 mg/dL — AB
Specific Gravity, Urine: 1.03 (ref 1.005–1.030)
Urobilinogen, UA: 0.2 mg/dL (ref 0.0–1.0)
pH: 6 (ref 5.0–8.0)

## 2011-04-08 LAB — PREGNANCY, URINE: Preg Test, Ur: NEGATIVE

## 2011-04-08 MED ORDER — PHENAZOPYRIDINE HCL 200 MG PO TABS: 200.0000 mg | ORAL_TABLET | Freq: Three times a day (TID) | ORAL | Status: AC

## 2011-04-08 MED ORDER — ONDANSETRON HCL 4 MG PO TABS: 4.0000 mg | ORAL_TABLET | Freq: Four times a day (QID) | ORAL | Status: DC

## 2011-04-08 MED ORDER — MOXIFLOXACIN HCL 400 MG PO TABS: 400.0000 mg | ORAL_TABLET | Freq: Every day | ORAL | Status: DC

## 2011-04-08 MED ORDER — PHENAZOPYRIDINE HCL 200 MG PO TABS: 200.0000 mg | ORAL_TABLET | Freq: Three times a day (TID) | ORAL | Status: DC

## 2011-04-08 MED ORDER — ONDANSETRON 4 MG PO TBDP
4.0000 mg | ORAL_TABLET | Freq: Once | ORAL | Status: AC
  Administered 2011-04-08: 4 mg via ORAL
  Filled 2011-04-08: qty 1

## 2011-04-08 NOTE — ED Provider Notes (Signed)
Medical screening examination/treatment/procedure(s) were conducted as a shared visit with non-physician practitioner(s) and myself.  I personally evaluated the patient during the encounter  Doug Sou, MD 04/08/11 [id number]
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
## 2011-04-08 NOTE — ED Notes (Signed)
Pt states she is having pelvic pain and lower back pain this am.  Pt having urgency and frequency of urine.  Pt wiped today and saw some brown discharge and some bright red blood.

## 2011-04-13 ENCOUNTER — Ambulatory Visit (HOSPITAL_BASED_OUTPATIENT_CLINIC_OR_DEPARTMENT_OTHER)
Admission: RE | Admit: 2011-04-13 | Discharge: 2011-04-13 | Disposition: A | Discharge status: Home / Self Care | Payer: Medicare Other | Source: Ambulatory Visit | Attending: Critical Care Medicine | Admitting: Critical Care Medicine

## 2011-04-13 ENCOUNTER — Encounter: Payer: Self-pay | Admitting: Critical Care Medicine

## 2011-04-13 ENCOUNTER — Ambulatory Visit (INDEPENDENT_AMBULATORY_CARE_PROVIDER_SITE_OTHER): Payer: Medicare Other | Admitting: Critical Care Medicine

## 2011-04-13 VITALS — BP 144/90 | HR 105 | Temp 98.9°F | Ht 62.0 in | Wt 179.0 lb

## 2011-04-13 DIAGNOSIS — R059 Cough, unspecified: Secondary | ICD-10-CM

## 2011-04-13 DIAGNOSIS — I10 Essential (primary) hypertension: Secondary | ICD-10-CM | POA: Insufficient documentation

## 2011-04-13 DIAGNOSIS — J42 Unspecified chronic bronchitis: Secondary | ICD-10-CM

## 2011-04-13 DIAGNOSIS — R05 Cough: Secondary | ICD-10-CM

## 2011-04-13 DIAGNOSIS — N390 Urinary tract infection, site not specified: Secondary | ICD-10-CM

## 2011-04-13 DIAGNOSIS — J189 Pneumonia, unspecified organism: Secondary | ICD-10-CM

## 2011-04-13 MED ORDER — LEVOFLOXACIN 500 MG PO TABS: 500.0000 mg | ORAL_TABLET | Freq: Every day | ORAL | Status: AC

## 2011-04-13 NOTE — Progress Notes (Signed)
  Subjective:    Patient ID: Jennifer [name], female    DOB: 06-Mar-1966, 45 y.o.   MRN: [id number]  HPI   Home sleep study reviewed  Flow evaluation period 10h 10 min AHI  2/h ODI 3/h Mins with saturation < 88% was 14 mins This suggests NO evidence  of sleep disordered breathing. Mild sleep related hypoxia - please corelate clinically. Please note that home study can underestimate severity of sleep disordered breathing  04/13/2011 Barking cough and green brown mucus.  Hard to cough out.  Symptoms started over past week.  Prior to this had UTI.   Was to do a botox this week and now cancelled, resched 9/21 More diff time eating, phlegm,  Cannot breathe when cough.  Pain on R side and bronchitis.  Pt in ED. rx UTI and given avelox , no help Review of Systems Constitutional:   No  weight loss, night sweats,  Fevers, chills, fatigue, lassitude. HEENT:   No headaches,  Difficulty swallowing,  Tooth/dental problems,  Sore throat,                No sneezing, itching, ear ache, nasal congestion, post nasal drip,   CV:  No chest pain,  Orthopnea, PND, swelling in lower extremities, anasarca, dizziness, palpitations  GI  No heartburn, indigestion, abdominal pain, nausea, vomiting, diarrhea, change in bowel habits, loss of appetite  Resp: Notes  shortness of breath with exertion and at rest.  No excess mucus, notes  productive cough,  No non-productive cough,  No coughing up of blood.  No change in color of mucus.  No wheezing.  No chest wall deformity  Skin: no rash or lesions.  GU: +  dysuria, change in color of urine, no urgency or frequency.  No flank pain.  MS:  No joint pain or swelling.  No decreased range of motion.  No back pain.  Psych:  No change in mood or affect. No depression or anxiety.  No memory loss.      Objective:   Physical Exam  Filed Vitals:   04/13/11 0910  BP: 144/90  Pulse: 105  Temp: 98.9 F (37.2 C)  TempSrc: Oral  Height: 5\' 2"  (1.575 m)  Weight:  179 lb (81.194 kg)  SpO2: 98%    Gen: Pleasant, well-nourished, in no distress,  normal affect  ENT: No lesions,  mouth clear,  oropharynx clear, no postnasal drip  Neck: No JVD, no TMG, no carotid bruits  Lungs: No use of accessory muscles, no dullness to percussion, scattered RLL exp wheeze and  rhonchi  Cardiovascular: RRR, heart sounds normal, no murmur or gallops, no peripheral edema  Abdomen: soft and NT, no HSM,  BS normal  Musculoskeletal: No deformities, no cyanosis or clubbing  Neuro: alert, non focal  Skin: Warm, no lesions or rashes   CXR 8/30: IMPRESSION: There is no evidence of acute cardiac or pulmonary process.       Assessment & Plan:   Bronchitis, chronic Acute on chronic tracheobronchitis with flare ? RLL PNA, not read by Rads but I ? Plan  Levaquin 500/d x 10days Stop Avelox d/t no coverage in Urine Chk Urine c/s

## 2011-04-13 NOTE — Assessment & Plan Note (Signed)
Acute on chronic tracheobronchitis with flare ? RLL PNA, not read by Rads but I ? Plan  Levaquin 500/d x 10days Stop Avelox d/t no coverage in Urine Chk Urine c/s

## 2011-04-13 NOTE — Patient Instructions (Signed)
Obtain a urine study today Start LEvaquin 500mg  daily for 10days Stop avelox Use tylenol for pain and Delsym as needed for cough No other changes I agree to postpone the botox Return Tuesday High POint for NP to reevaluate you

## 2011-04-14 ENCOUNTER — Emergency Department (HOSPITAL_BASED_OUTPATIENT_CLINIC_OR_DEPARTMENT_OTHER)
Admission: EM | Admit: 2011-04-14 | Discharge: 2011-04-14 | Discharge status: Against Medical Advice | Payer: Medicare Other | Attending: Emergency Medicine | Admitting: Emergency Medicine

## 2011-04-14 ENCOUNTER — Encounter (HOSPITAL_BASED_OUTPATIENT_CLINIC_OR_DEPARTMENT_OTHER): Payer: Self-pay | Admitting: *Deleted

## 2011-04-14 DIAGNOSIS — J069 Acute upper respiratory infection, unspecified: Secondary | ICD-10-CM | POA: Insufficient documentation

## 2011-04-14 LAB — URINALYSIS
Bilirubin Urine: NEGATIVE
Glucose, UA: NEGATIVE mg/dL
Hgb urine dipstick: NEGATIVE
Ketones, ur: NEGATIVE mg/dL
Leukocytes, UA: NEGATIVE
Nitrite: NEGATIVE
Protein, ur: NEGATIVE mg/dL
Specific Gravity, Urine: 1.025 (ref 1.005–1.030)
Urobilinogen, UA: 0.2 mg/dL (ref 0.0–1.0)
pH: 5.5 (ref 5.0–8.0)

## 2011-04-14 NOTE — ED Notes (Signed)
Cough 2 days. Was treated for UTI here 6 days ago. Saw Dr Delford Field yesterday and diagnosed with pneumonia. Thought she would feel better today but is no better.

## 2011-04-14 NOTE — ED Notes (Signed)
Patient wan not in lobby when called.  Registration staff states patient left and refused to sign ama form.

## 2011-04-14 NOTE — ED Notes (Signed)
Pt stating in triage that she was seen by Dr. Delford Field her Pulmonary Dr yesterday and was sent down for a x-ray here at [facility].  Stating her X-ray was normal per our radiologist but Dr. Delford Field told her she had PNA.   In triage she requested that I do a ABG and give her a breathing tx. BBS was clear in triage & SPO2 was 100%.  No coughing while I was in triage with pt. Advised pt to stay in triage until room available.

## 2011-04-14 NOTE — ED Notes (Signed)
Pt states if we do not take her back right now she is leaving. Pt is not in distress. Wait explained to pt. Asked to wait in triage room 1 and let me know if she decides to leave.

## 2011-04-15 LAB — CULTURE, URINE COMPREHENSIVE
Colony Count: NO GROWTH
Organism ID, Bacteria: NO GROWTH

## 2011-04-18 ENCOUNTER — Emergency Department (HOSPITAL_BASED_OUTPATIENT_CLINIC_OR_DEPARTMENT_OTHER)
Admission: EM | Admit: 2011-04-18 | Discharge: 2011-04-18 | Disposition: A | Discharge status: Home / Self Care | Payer: Medicare Other | Attending: Emergency Medicine | Admitting: Emergency Medicine

## 2011-04-18 ENCOUNTER — Emergency Department (INDEPENDENT_AMBULATORY_CARE_PROVIDER_SITE_OTHER): Payer: Medicare Other

## 2011-04-18 ENCOUNTER — Encounter (HOSPITAL_BASED_OUTPATIENT_CLINIC_OR_DEPARTMENT_OTHER): Payer: Self-pay | Admitting: *Deleted

## 2011-04-18 ENCOUNTER — Other Ambulatory Visit: Payer: Self-pay

## 2011-04-18 DIAGNOSIS — R05 Cough: Secondary | ICD-10-CM

## 2011-04-18 DIAGNOSIS — R0989 Other specified symptoms and signs involving the circulatory and respiratory systems: Secondary | ICD-10-CM

## 2011-04-18 DIAGNOSIS — J069 Acute upper respiratory infection, unspecified: Secondary | ICD-10-CM | POA: Insufficient documentation

## 2011-04-18 DIAGNOSIS — E785 Hyperlipidemia, unspecified: Secondary | ICD-10-CM | POA: Insufficient documentation

## 2011-04-18 DIAGNOSIS — R0602 Shortness of breath: Secondary | ICD-10-CM

## 2011-04-18 DIAGNOSIS — R059 Cough, unspecified: Secondary | ICD-10-CM

## 2011-04-18 DIAGNOSIS — G4730 Sleep apnea, unspecified: Secondary | ICD-10-CM | POA: Insufficient documentation

## 2011-04-18 DIAGNOSIS — G8929 Other chronic pain: Secondary | ICD-10-CM | POA: Insufficient documentation

## 2011-04-18 DIAGNOSIS — I10 Essential (primary) hypertension: Secondary | ICD-10-CM | POA: Insufficient documentation

## 2011-04-18 LAB — D-DIMER, QUANTITATIVE (NOT AT ARMC): D-Dimer, Quant: 0.28 ug/mL-FEU (ref 0.00–0.48)

## 2011-04-18 NOTE — ED Notes (Signed)
Cold symptoms a couple of weeks. Symptoms no better. Feels like her heart is racing and she is hot.
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
## 2011-04-19 ENCOUNTER — Ambulatory Visit: Payer: Medicare Other | Admitting: Adult Health

## 2011-04-19 ENCOUNTER — Telehealth: Payer: Self-pay | Admitting: Critical Care Medicine

## 2011-04-19 IMAGING — CR DG ABDOMEN ACUTE W/ 1V CHEST
3 series · 3 of 3 positions shown · non-contrast
Comparison: 07/01/2009.

CLINICAL DATA: Abdominal pain.  Nausea and constipation.

ACUTE ABDOMEN SERIES (ABDOMEN 2 VIEW & CHEST 1 VIEW)

[w chest pa]
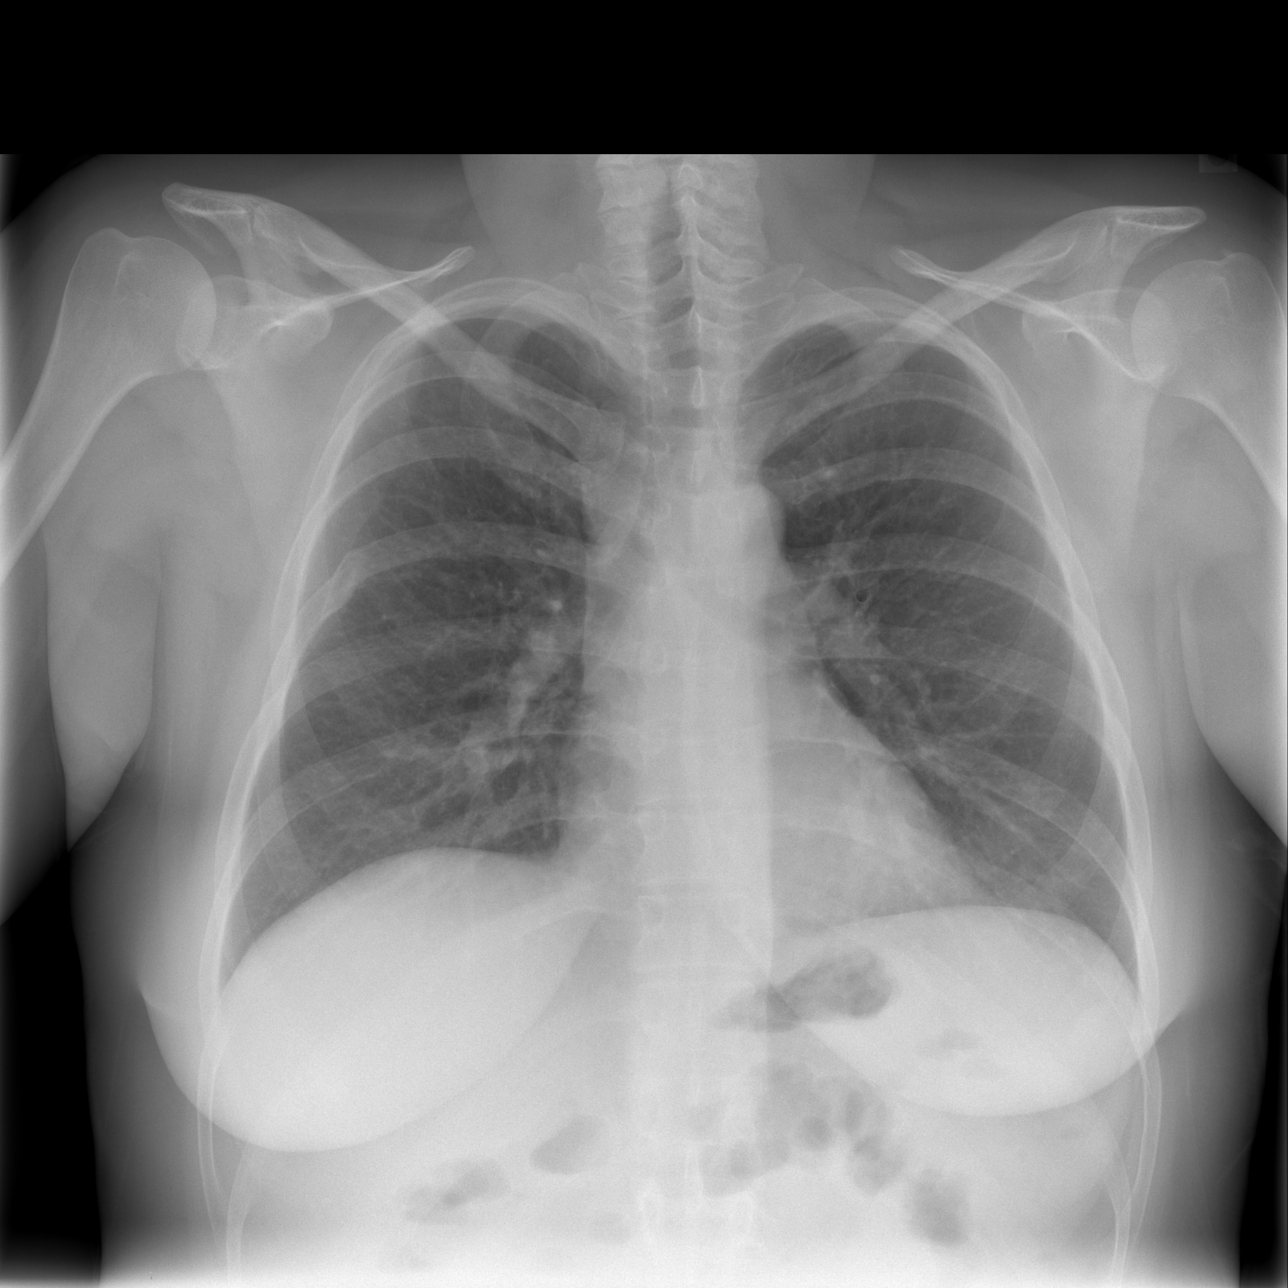

[w abdomen upright *]
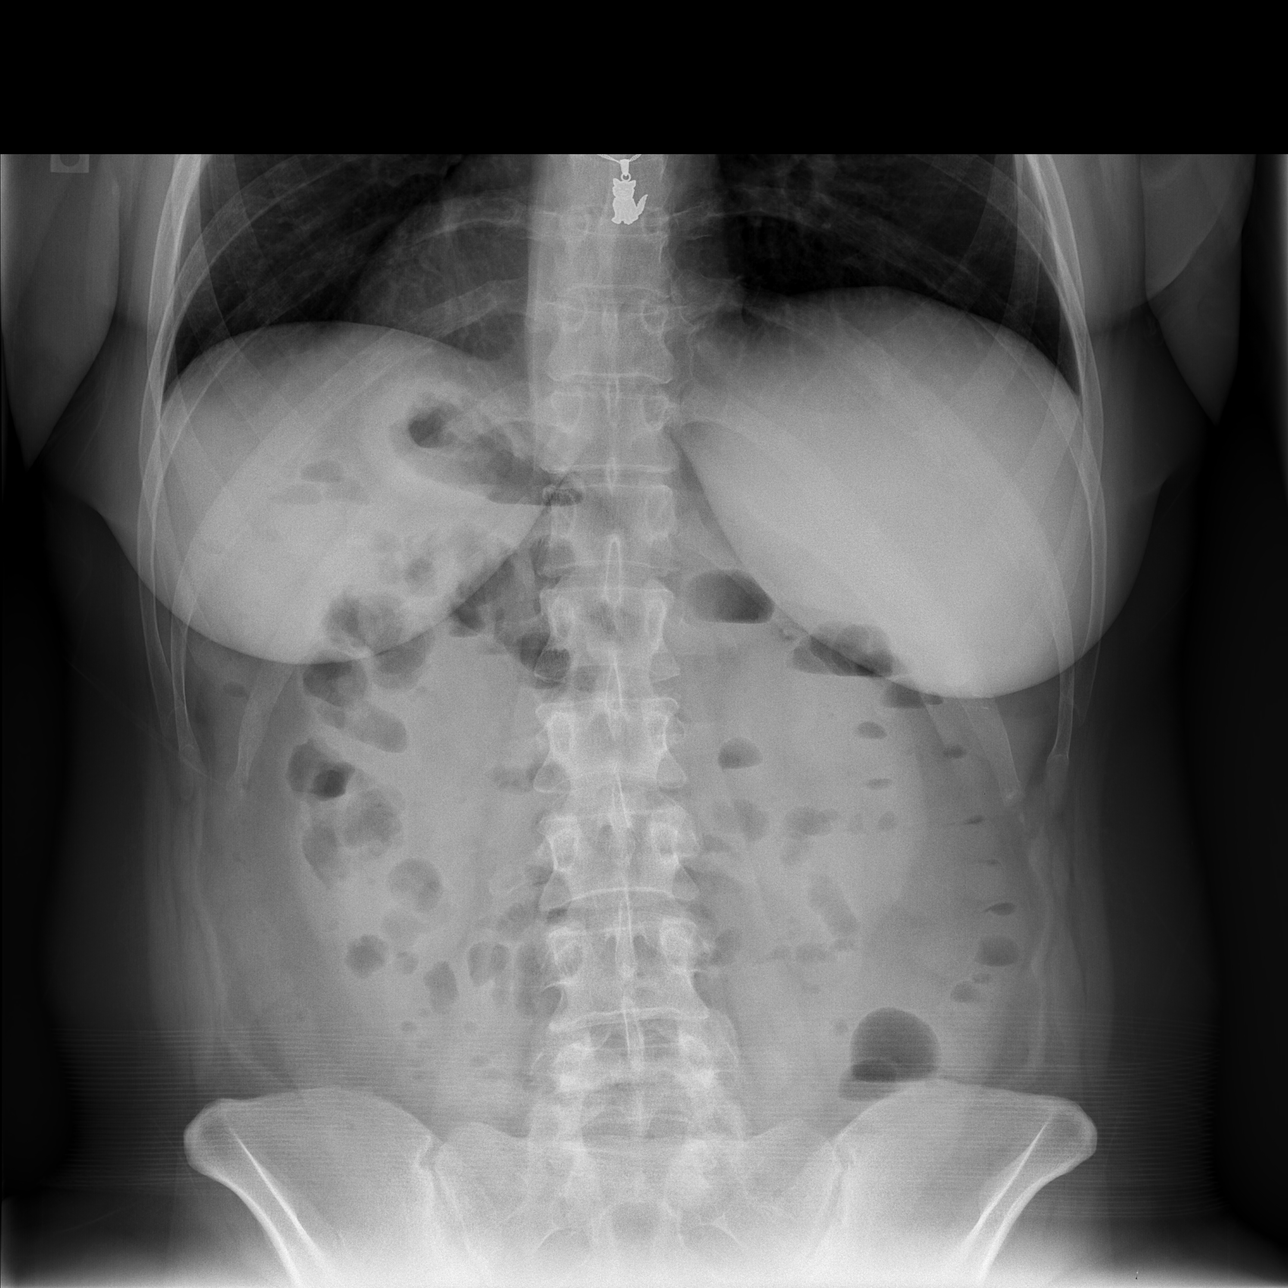

[t abdomen supine]
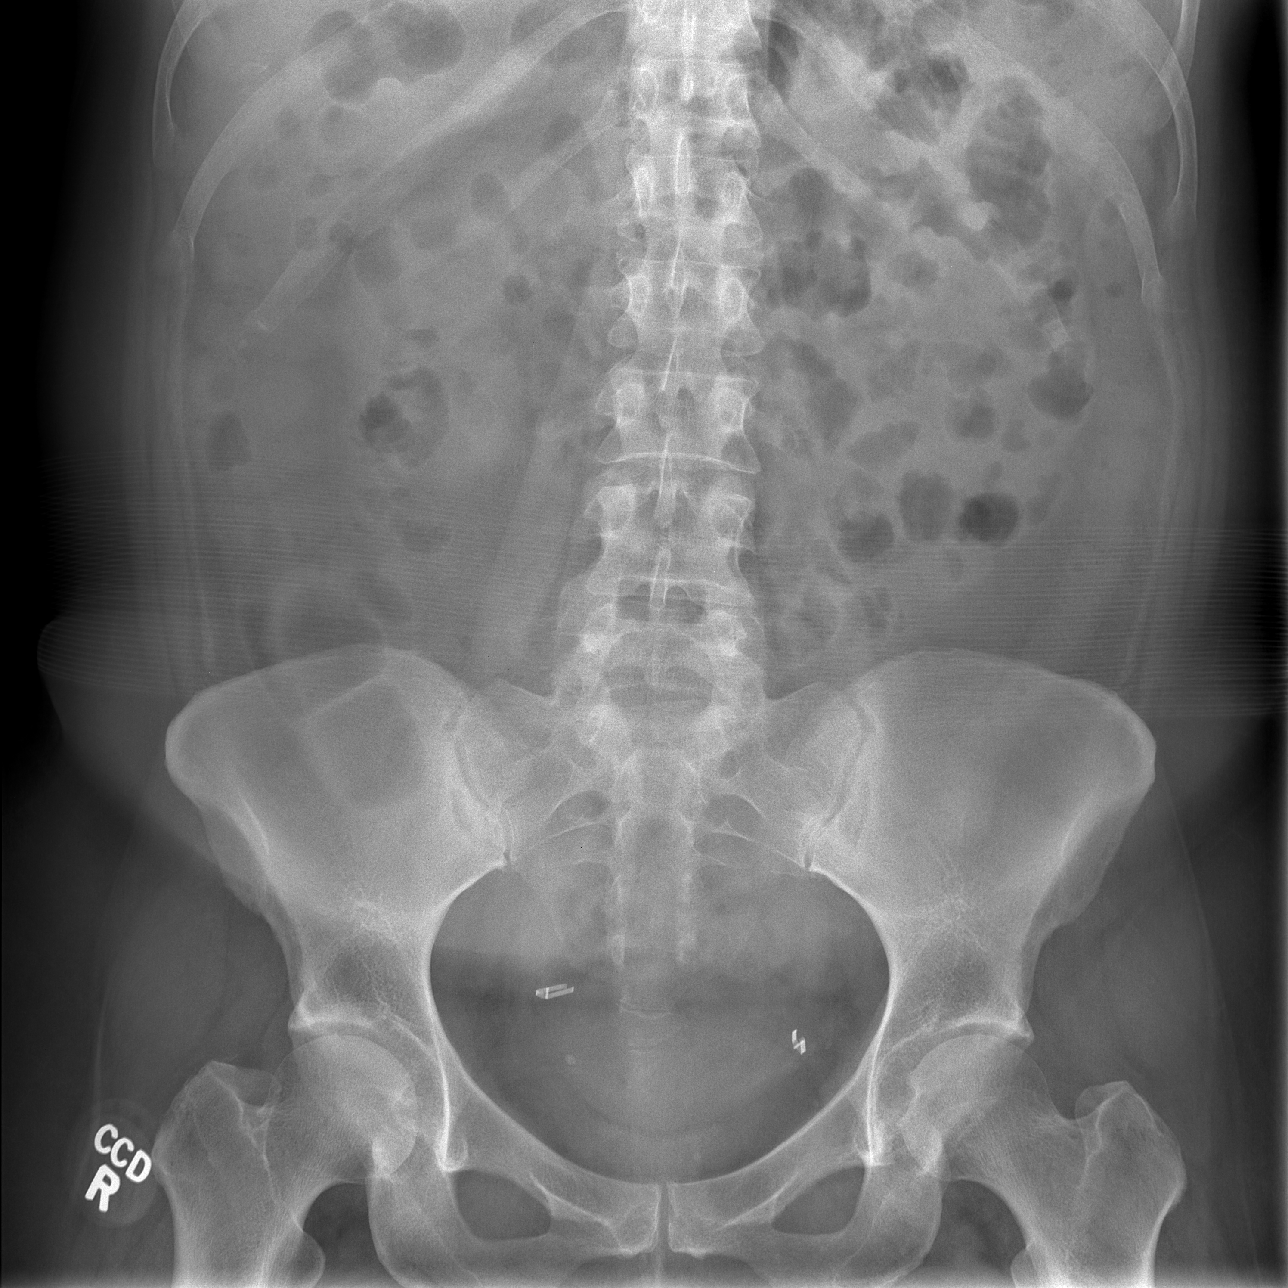

[3 of 3 positions shown; findings below may reference images not displayed]

FINDINGS: Old right rib fractures.  Bilateral basilar atelectasis.
Cardiopericardial silhouette within normal limits.  No free air is
identified under the hemidiaphragms.  Scattered nonspecific air-
fluid levels are present throughout the abdomen without small bowel
dilation.  Tubal ligation clips present in the anatomic pelvis.
Distal colon is decompressed.  No rectal gas identified.
IMPRESSION: Nonspecific scattered small air fluid levels within small bowel.
Paucity of distal rectal gas.  This can be associated with enteric
infection or early partial small bowel obstruction.

## 2011-04-19 NOTE — Telephone Encounter (Signed)
Spoke with the pt and she states her husband is in CCU at [facility] and she wants to know if it is ok to r/s her appt from tomorrow so she can stay with her husband. She was seen in the ER on 04-18-11 and they did an xray and blood work and told her everything was fine. She states she feels better today. I advised if she feels need to r/s the appt that would be fine. Appt cancelled. Pt will call to r/s once she knows how her husband is.  Carron Curie, CMA

## 2011-04-20 ENCOUNTER — Ambulatory Visit: Payer: Medicare Other | Admitting: Critical Care Medicine

## 2011-04-20 IMAGING — CT CT ABD-PELV W/ CM
2 of 5 series · 17 of 46 positions shown, 19 images · IV contrast (agent unspecified)
Comparison: Plain films earlier today.

CLINICAL DATA: Abdominal pain.  Distention.  Constipation.  Nausea.

CT ABDOMEN AND PELVIS WITH CONTRAST
TECHNIQUE: Multidetector CT imaging of the abdomen and pelvis was
performed following the standard protocol during bolus
administration of intravenous contrast.
Contrast: 100 ml Smnipaque-NRR.

[Series 2: rtn ap with st · axial · 0.61mm/px · z∈[+888,+1278]mm · 14 of 88 slices shown, 16 images]
[im 5/88  soft-tissue]
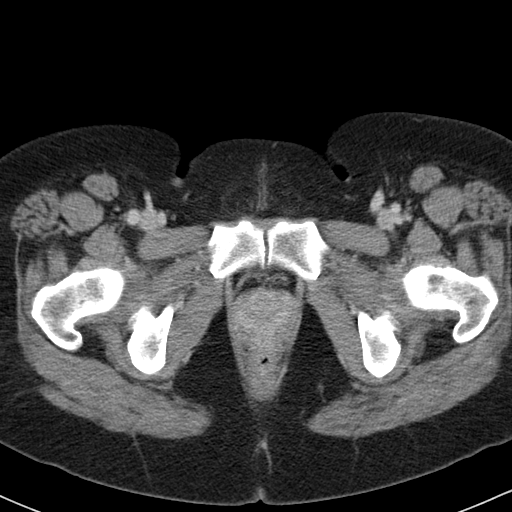
[im 5/88  bone]
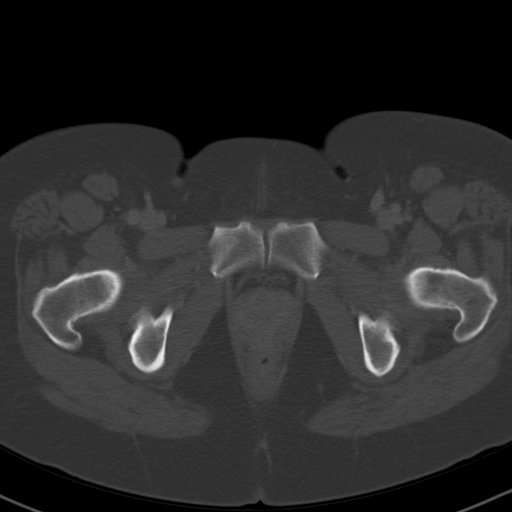
[im 14/88  soft-tissue]
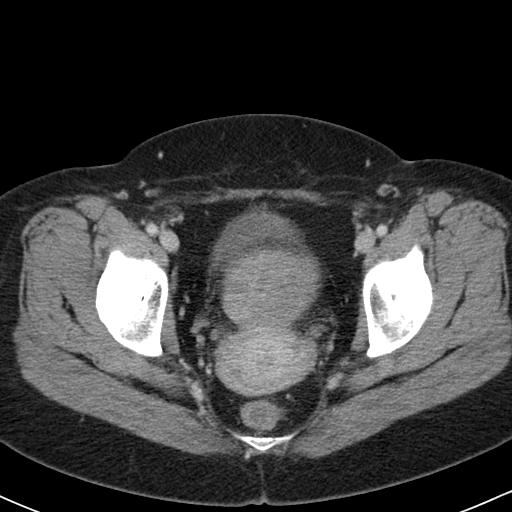
[im 18/88  soft-tissue]
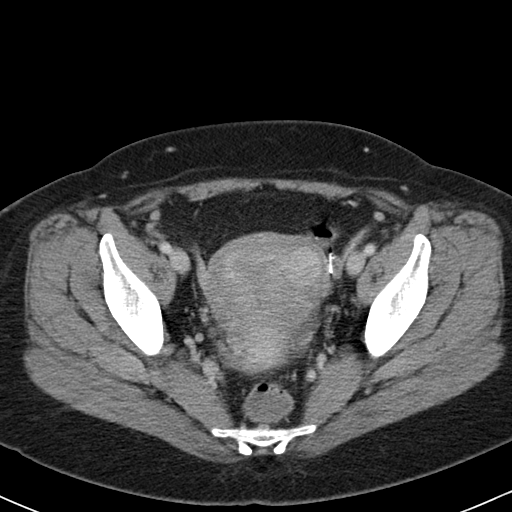
[im 22/88  soft-tissue]
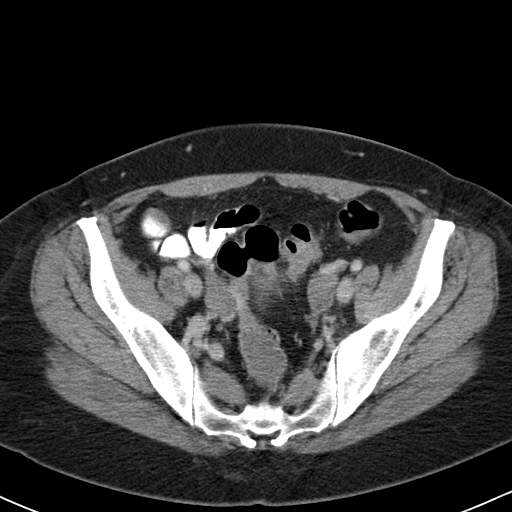
[im 31/88  soft-tissue]
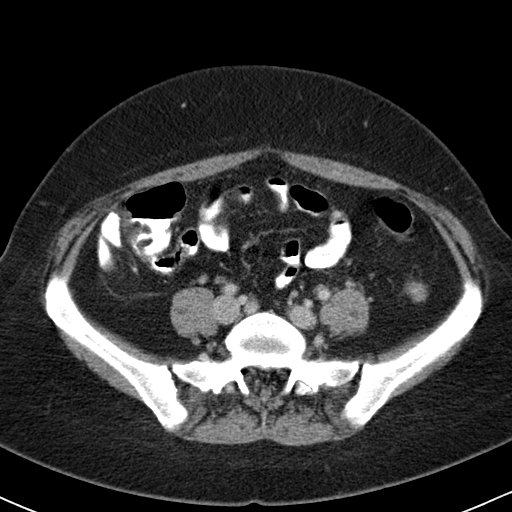
[im 35/88  soft-tissue]
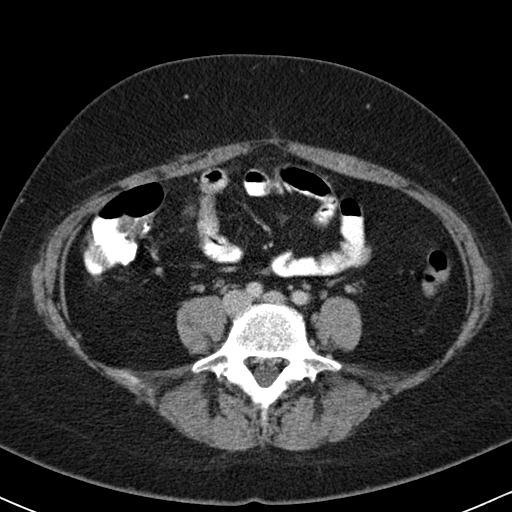
[im 40/88  soft-tissue]
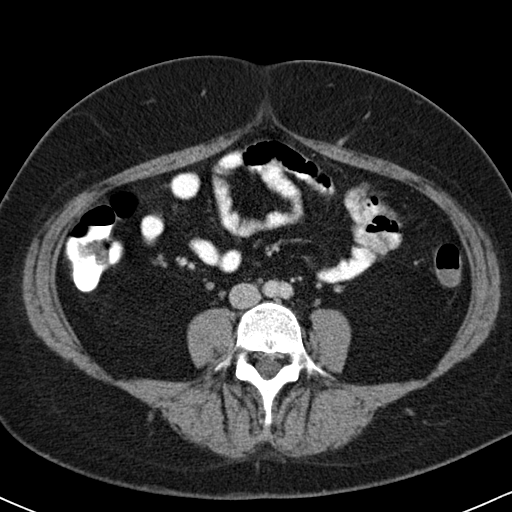
[im 48/88  soft-tissue]
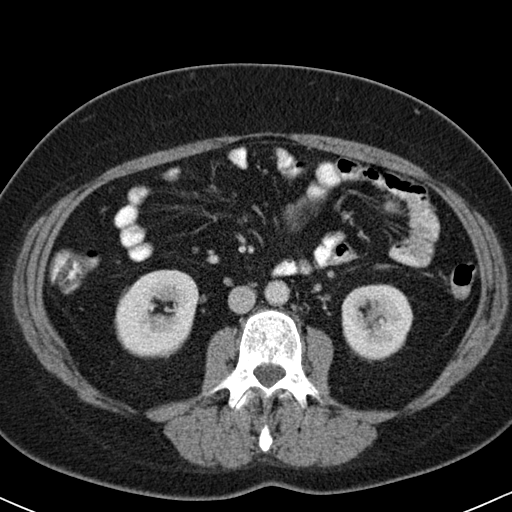
[im 53/88  soft-tissue]
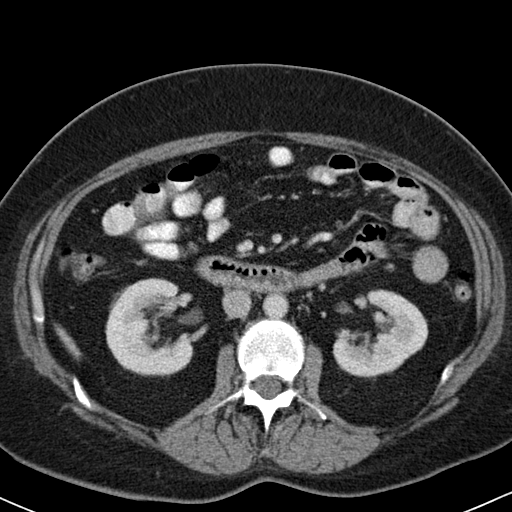
[im 53/88  bone]
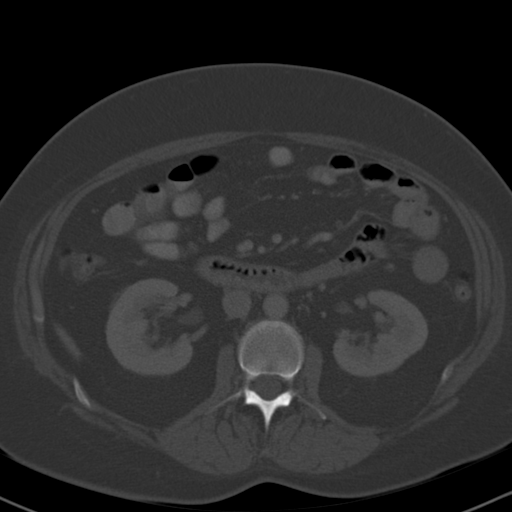
[im 57/88  soft-tissue]
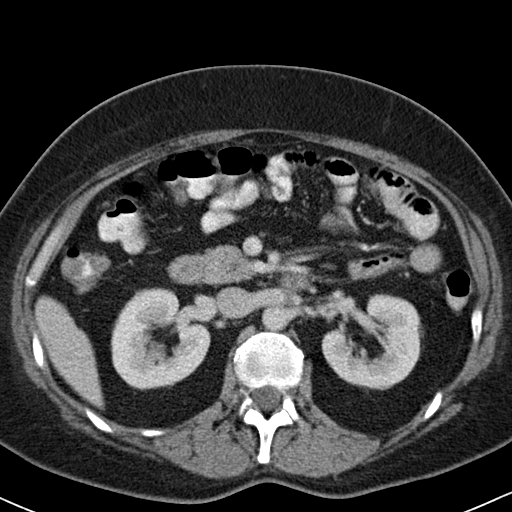
[im 66/88  soft-tissue]
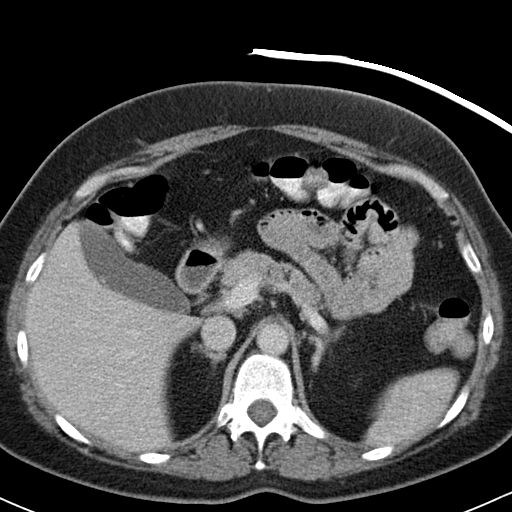
[im 70/88  soft-tissue]
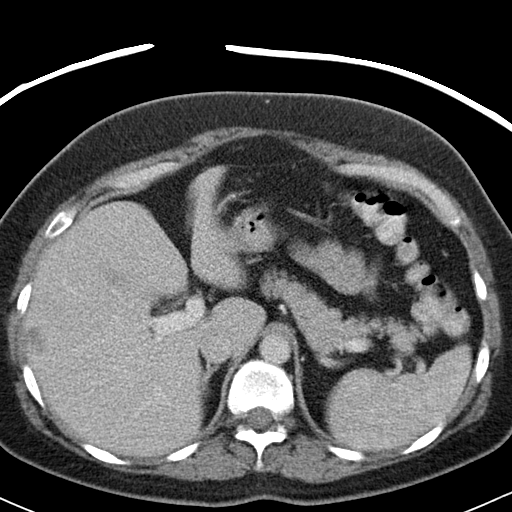
[im 74/88  soft-tissue]
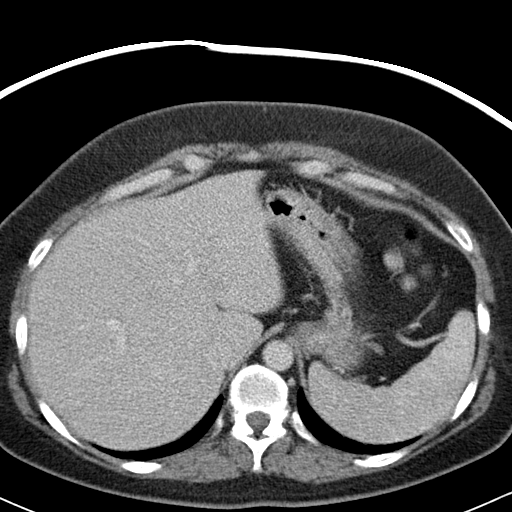
[im 83/88  soft-tissue]
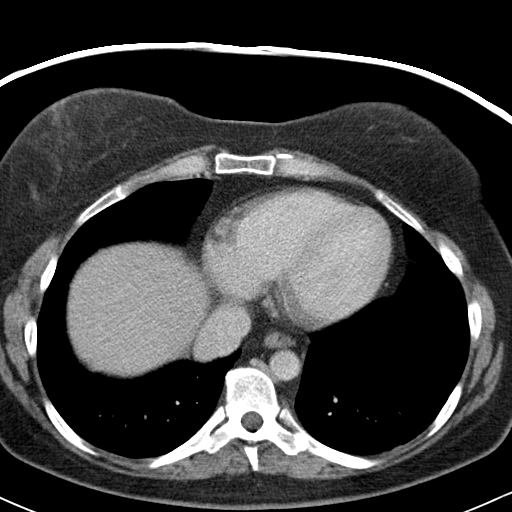

[Series 602: <mpr thick range> · coronal · 0.89mm/px · 3 of 77 slices shown]
[im 26/77  soft-tissue]
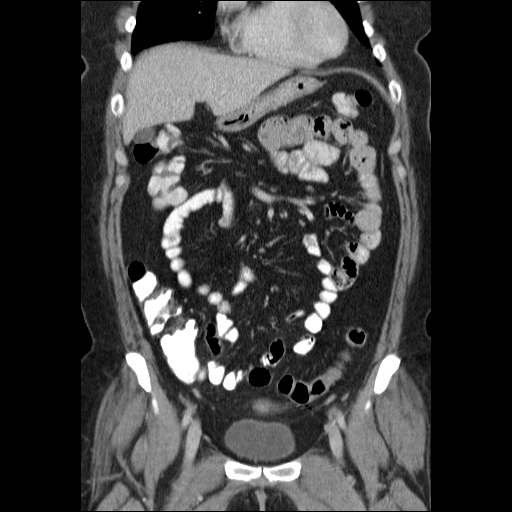
[im 34/77  soft-tissue]
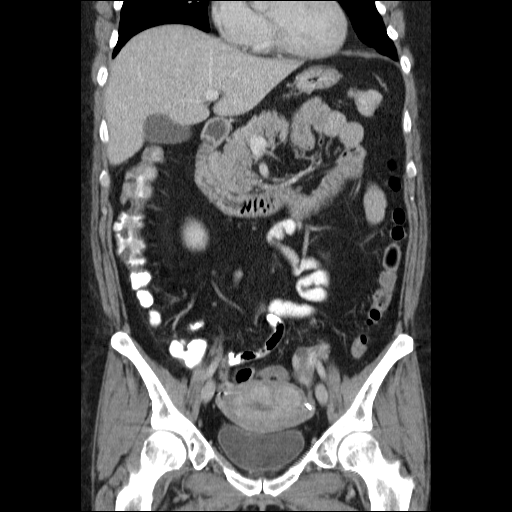
[im 43/77  soft-tissue]
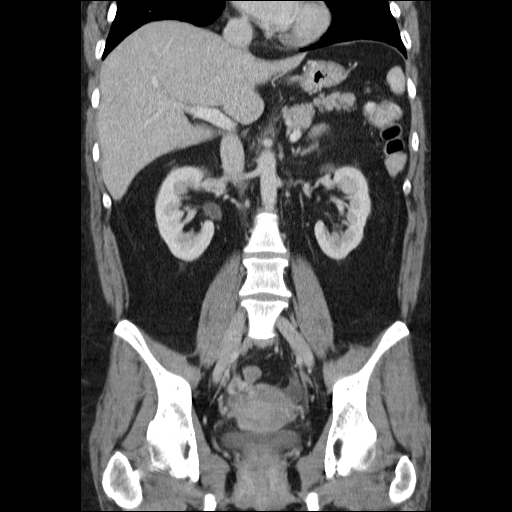

[17 of 46 positions shown; findings below may reference images not displayed]

FINDINGS: Dependent atelectasis of the lung bases.  Liver
demonstrates a 1.8 cm lateral right hepatic lobe cavernous
hemangioma which fills in on delayed imaging.  Tiny dependent
gallstone within the gallbladder.  Adrenal glands normal.  Normal
renal enhancement.  Normal delayed excretion of contrast from the
kidneys.  Spleen unremarkable.  The stomach and proximal small
bowel appears normal.  No adenopathy.  No free air.  The appendix
is surgically absent.  Colon appears within normal limits.  No
inflammatory changes are present.  The tubal ligation clips in the
anatomic pelvis.  Physiologic appearance of the uterus and adnexa.
Small amount of free fluid adjacent to the left ovary is consistent
with rupture of an ovarian cyst.
IMPRESSION: 1.  No acute abnormality.
2.  Cholelithiasis.
3.  Unchanged right hepatic lobe hemangioma.
4.  Bilateral tubal ligation.
5.  Small amount of free fluid adjacent to the left adnexa
compatible with ruptured ovarian cyst.

## 2011-04-24 ENCOUNTER — Ambulatory Visit (INDEPENDENT_AMBULATORY_CARE_PROVIDER_SITE_OTHER): Payer: Medicare Other | Admitting: Adult Health

## 2011-04-24 ENCOUNTER — Encounter: Payer: Self-pay | Admitting: Adult Health

## 2011-04-24 VITALS — BP 126/82 | HR 88 | Temp 98.1°F | Ht 62.0 in | Wt 179.6 lb

## 2011-04-24 DIAGNOSIS — K219 Gastro-esophageal reflux disease without esophagitis: Secondary | ICD-10-CM

## 2011-04-24 MED ORDER — BENZONATATE 200 MG PO CAPS: 200.0000 mg | ORAL_CAPSULE | Freq: Three times a day (TID) | ORAL | Status: AC | PRN

## 2011-04-24 NOTE — Assessment & Plan Note (Addendum)
Severe GERD episode with exacerbation of Upper airway cough  Pt does not appear to be in any distress, VSS . Will try to tx GERD aggressively and  Control cough to allow airway to heal.  May need GI referral if not improving , and /or ENT for frequent Upper airway issues Has had multiple cxr lately with no acute findings.  Encouraged on GERD diet , and med compliance Avoid steroids in this pt due to effect on psych issues.   Plan:  Prevacid 30mg  daily  Zantac 150mg  At bedtime   GERD diet  Carafate As needed  For breakthrough reflux.  Delsym 2 tsp Twice daily  As needed  cough Tessalon Three times a day  As needed  Cough  Try to stop your cough, use frequent sips of water. Avoid mints.  follow up Dr. Delford Field  In 1 weeks in [facility]  Please contact office for sooner follow up if symptoms do not improve or worsen or seek emergency care

## 2011-04-24 NOTE — Patient Instructions (Addendum)
Prevacid 30mg  daily  Zantac 150mg  At bedtime   GERD diet  Carafate As needed  For breakthrough reflux.  Delsym 2 tsp Twice daily  As needed  cough Tessalon Three times a day  As needed  Cough  Try to stop your cough, use frequent sips of water. Avoid mints.  follow up Dr. Delford Field  In 1 weeks in [facility]  Please contact office for sooner follow up if symptoms do not improve or worsen or seek emergency care

## 2011-04-24 NOTE — Progress Notes (Signed)
Subjective:    Patient ID: Jennifer [name], female    DOB: [birth year], 45 y.o.   MRN: [id number]  HPI   Home sleep study reviewed  Flow evaluation period 10h 10 min AHI  2/h ODI 3/h Mins with saturation < 88% was 14 mins This suggests NO evidence  of sleep disordered breathing. Mild sleep related hypoxia - please corelate clinically. Please note that home study can underestimate severity of sleep disordered breathing  04/13/11  Barking cough and green brown mucus.  Hard to cough out.  Symptoms started over past week.  Prior to this had UTI.   Was to do a botox this week and now cancelled, resched 9/21 More diff time eating, phlegm,  Cannot breathe when cough.  Pain on R side and bronchitis. Pt in ED. rx UTI and given avelox , no help Tx w/ Levaquin x 10 days     04/24/2011 Acute OV  Pt presents for an acute office visit.  Complains of hoarseness, feels like something is stuck in her trachial area, dry cough. Pt states she feels like she can't breathe. Pt  c/o wheezing, chest tightness, vomiting yesterday. She coughs so hard she vomits. Felt last night something came up in throat when she was lying down and since then she has had increased cough. Inside of throat feels swollen and irritated. She has has esophageal dilatation in July this year. No fever, discolored mucus or hememesis. No dysphagia. Painful to swallow at times. In the office no stridor and swallows water without difficulty. No choking observed.   Seen by Allergist this am in [facility]. And referred to our office per pt. No notes available.  She was seen in the office 2 weeks ago for bronchitis flare . Given abx rx. But never filled Levaquin. She was on Avelox prior to visit. Says she finshed Avelox for total course. She was seen in ER last night at [facility] , tx with nebs. CXR was neg per pt.  Seen in ER at [facility] on 9/4 , soft tissue neck xray  was neg. CXR was neg. D. Dimer was neg. Referred to ENT for possible  Upper  airway issues. She is followed at [facility] by ENT and voice center. Does receive botox injections .   Not taking her prevacid everyday. Alternates between prevacid and zantac. Not taking carafate.    Review of Systems Constitutional:   No  weight loss, night sweats,  Fevers, chills, fatigue, lassitude. HEENT:   No headaches,  Difficulty swallowing,  Tooth/dental problems,  Sore throat,                No sneezing, itching, ear ache, nasal congestion, post nasal drip,   CV:  No chest pain,  Orthopnea, PND, swelling in lower extremities, anasarca, dizziness, palpitations  GI  No heartburn, indigestion, abdominal pain,  +nausea, vomiting, Neg diarrhea, change in bowel habits, loss of appetite  Resp: Notes  shortness of breath with exertion and at rest.  +clear mucus, notes  productive cough,  No non-productive cough,  No coughing up of blood.  No change in color of mucus.  No wheezing.  No chest wall deformity  Skin: no rash or lesions.  GU: NO   dysuria, change in color of urine, no urgency or frequency.  No flank pain.  MS:  No joint pain or swelling.  No decreased range of motion.  No back pain.  Psych:  No change in mood or affect. No depression or anxiety.  No memory loss.      Objective:   Physical Exam  Filed Vitals:   04/24/11 1212  BP: 126/82  Pulse: 88  Temp: 98.1 F (36.7 C)  TempSrc: Oral  Height: 5\' 2"  (1.575 m)  Weight: 179 lb 9.6 oz (81.466 kg)  SpO2: 99%    Gen: Pleasant, well-nourished, in no distress,   ENT: No lesions,  mouth clear,  oropharynx clear, no postnasal drip  Neck: No JVD, no TMG, no carotid bruits  Lungs: No use of accessory muscles, no dullness to percussion, CTA w/ no wheezing, stridor   Cardiovascular: RRR, heart sounds normal, no murmur or gallops, no peripheral edema  Abdomen: soft and NT, no HSM,  BS normal  Musculoskeletal: No deformities, no cyanosis or clubbing  Neuro: alert, non focal  Skin: Warm, no lesions or  rashes         Assessment & Plan:

## 2011-04-25 ENCOUNTER — Telehealth: Payer: Self-pay | Admitting: Critical Care Medicine

## 2011-04-25 ENCOUNTER — Ambulatory Visit: Payer: Medicare Other | Admitting: Adult Health

## 2011-04-25 NOTE — Telephone Encounter (Signed)
Forwarded to Dr. Wright for review. °

## 2011-05-02 ENCOUNTER — Ambulatory Visit (INDEPENDENT_AMBULATORY_CARE_PROVIDER_SITE_OTHER): Payer: Medicare Other | Admitting: Adult Health

## 2011-05-02 DIAGNOSIS — K219 Gastro-esophageal reflux disease without esophagitis: Secondary | ICD-10-CM

## 2011-05-02 NOTE — Patient Instructions (Signed)
Increase Prevacid 30mg  Twice daily  Before meal  Zantac 150mg  At bedtime   GERD diet Gas X as needed.   Increase Carafate Four times a day    Delsym 2 tsp Twice daily cough Tessalon Three times a day  As needed  Cough  Try to stop your cough, use frequent sips of water. Avoid mints.  follow up Dr. Delford Field  In 1 weeks in [facility]  Please contact office for sooner follow up if symptoms do not improve or worsen or seek emergency care

## 2011-05-02 NOTE — Progress Notes (Signed)
Subjective:    Patient ID: Jennifer [name], female    DOB: 08-06-1966, 45 y.o.   MRN: [id number]  HPI   Home sleep study reviewed  Flow evaluation period 10h 10 min AHI  2/h ODI 3/h Mins with saturation < 88% was 14 mins This suggests NO evidence  of sleep disordered breathing. Mild sleep related hypoxia - please corelate clinically. Please note that home study can underestimate severity of sleep disordered breathing  04/13/11  Barking cough and green brown mucus.  Hard to cough out.  Symptoms started over past week.  Prior to this had UTI.   Was to do a botox this week and now cancelled, resched 9/21 More diff time eating, phlegm,  Cannot breathe when cough.  Pain on R side and bronchitis. Pt in ED. rx UTI and given avelox , no help Tx w/ Levaquin x 10 days     04/24/2011 Acute OV  Pt presents for an acute office visit.  Complains of hoarseness, feels like something is stuck in her trachial area, dry cough. Pt states she feels like she can't breathe. Pt  c/o wheezing, chest tightness, vomiting yesterday. She coughs so hard she vomits. Felt last night something came up in throat when she was lying down and since then she has had increased cough. Inside of throat feels swollen and irritated. She has has esophageal dilatation in July this year. No fever, discolored mucus or hememesis. No dysphagia. Painful to swallow at times. In the office no stridor and swallows water without difficulty. No choking observed.   Seen by Allergist this am in [facility]. And referred to our office per pt. No notes available.  She was seen in the office 2 weeks ago for bronchitis flare . Given abx rx. But never filled Levaquin. She was on Avelox prior to visit. Says she finshed Avelox for total course. She was seen in ER last night at [facility] , tx with nebs. CXR was neg per pt.  Seen in ER at [facility] on 9/4 , soft tissue neck xray  was neg. CXR was neg. D. Dimer was neg. Referred to ENT for possible  Upper  airway issues. She is followed at [facility] by ENT and voice center. Does receive botox injections .   Not taking her prevacid everyday. Alternates between prevacid and zantac. Not taking carafate.  >>Rx aggressive GERD prevention regimen and cough meds.  05/02/2011 Acute OV Complains of  choking when eatting and drinking, feels the need to cough to catch breath, "vomit feeling" when eating at times. We discussed referring her back to GI -she declines wants to wait until after her botox injection this week. Have offerred to refer to another GI practice but she declined. We reivewed a GERD diet and maximum reflux prevention stratigies.  No discolored mucus , hemaemesis, chest pain, diarrhea or urinary symptoms.     Review of Systems Constitutional:   No  weight loss, night sweats,  Fevers, chills, fatigue, lassitude. HEENT:   No headaches,  Difficulty swallowing,  Tooth/dental problems,  Sore throat,                No sneezing, itching, ear ache, nasal congestion, post nasal drip,   CV:  No chest pain,  Orthopnea, PND, swelling in lower extremities, anasarca, dizziness, palpitations  GI  No heartburn, indigestion, abdominal pain,  +nausea, vomiting, Neg diarrhea, change in bowel habits, loss of appetite  Resp: Notes  shortness of breath with exertion and at rest.  +  clear mucus, notes  productive cough,  No non-productive cough,  No coughing up of blood.  No change in color of mucus.  No wheezing.  No chest wall deformity  Skin: no rash or lesions.  GU: NO   dysuria, change in color of urine, no urgency or frequency.  No flank pain.  MS:  No joint pain or swelling.  No decreased range of motion.  No back pain.  Psych:  No change in mood or affect. No depression or anxiety.  No memory loss.      Objective:   Physical Exam 97 temp 118/80 , 97 hr 99% on room air     Gen: Pleasant, well-nourished, in no distress,   ENT: No lesions,  mouth clear,  oropharynx clear, no postnasal  drip  Neck: No JVD, no TMG, no carotid bruits  Lungs: No use of accessory muscles, no dullness to percussion, CTA w/ no wheezing, stridor   Cardiovascular: RRR, heart sounds normal, no murmur or gallops, no peripheral edema  Abdomen: soft and NT, no HSM,  BS normal  Musculoskeletal: No deformities, no cyanosis or clubbing  Neuro: alert, non focal  Skin: Warm, no lesions or rashes         Assessment & Plan:

## 2011-05-04 ENCOUNTER — Ambulatory Visit: Payer: Medicare Other | Admitting: Critical Care Medicine

## 2011-05-04 ENCOUNTER — Ambulatory Visit (INDEPENDENT_AMBULATORY_CARE_PROVIDER_SITE_OTHER): Payer: Medicare Other | Admitting: Critical Care Medicine

## 2011-05-04 ENCOUNTER — Encounter: Payer: Self-pay | Admitting: Critical Care Medicine

## 2011-05-04 DIAGNOSIS — J383 Other diseases of vocal cords: Secondary | ICD-10-CM

## 2011-05-04 NOTE — Assessment & Plan Note (Signed)
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 This is the third visit in practice in the last week with this patient. The patient today expressed an unusual amount of self-realization that she has severe psychogenic-induced symptom complex. She validates that she has an extreme desire to be seen by  health-care providers frequently to  reassure herself that there is not anything acute going on. She validates  that she has thwarted  her psychologists and psychiatrists attempts to get her on a stable medication program. She had fears of side effects from these  medications. We note and we discussed with this patient about issues related to the many  allergies listed on her medication profile. It is likely the same fear of adverse reactions occur in her psychogenic medications.  I reassured the patient that she does not have any acute process occurring at this time. Her lungs are clear. There is no evidence of severe dehydration. She is due to have a Botox injection 24 hours this would help her symptom complex. She was instructed to use her proton pump inhibitor and H2 blockers  on regular basis. She was to return to this clinic as needed.
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
## 2011-05-04 NOTE — Telephone Encounter (Signed)
Pt was contacted about the referral process and she is indecisive.  Has seen another PCP closer to home in the system Dr. Ty Hilts.  Pt having surgery tomorrow and will call back when she decides if she wants [place] to refer her for her levoscoliosis. Jarvis Newcomer, LPN [facility]

## 2011-05-04 NOTE — Patient Instructions (Signed)
Keep your botox appointment Follow up with your therapist and psychiatrist Take your reflux medication regularly Push fluids this afternoon and tonite, dont try to eat too much food Use Robitussin as needed No antibiotics are needed Return as needed

## 2011-05-04 NOTE — Assessment & Plan Note (Addendum)
Exacerbation w/ irritation of upper airway,  Hold on GI referral for now per pt request-await botox injection   Plan:  ncrease Prevacid 30mg  Twice daily  Before meal  Zantac 150mg  At bedtime   GERD diet Gas X as needed.   Increase Carafate Four times a day    Delsym 2 tsp Twice daily cough Tessalon Three times a day  As needed  Cough  Try to stop your cough, use frequent sips of water. Avoid mints.  follow up Dr. Delford Field  In 1 weeks in [facility]  Please contact office for sooner follow up if symptoms do not improve or worsen or seek emergency care

## 2011-05-05 LAB — I-STAT 8, (EC8 V) (CONVERTED LAB)
BUN: 13
Bicarbonate: 23.6
Chloride: 109
Glucose, Bld: 107 — ABNORMAL HIGH
HCT: 39
Hemoglobin: 13.3
Operator id: 294511
Potassium: 3.8
Sodium: 141
TCO2: 25
pCO2, Ven: 35.7 — ABNORMAL LOW
pH, Ven: 7.429 — ABNORMAL HIGH

## 2011-05-05 LAB — DIFFERENTIAL
Basophils Absolute: 0
Basophils Relative: 1
Eosinophils Absolute: 0.2
Eosinophils Relative: 3
Lymphocytes Relative: 26
Lymphs Abs: 1.7
Monocytes Absolute: 0.6
Monocytes Relative: 9
Neutro Abs: 4
Neutrophils Relative %: 61

## 2011-05-05 LAB — POCT I-STAT CREATININE
Creatinine, Ser: 0.6
Operator id: 294511

## 2011-05-05 LAB — URINALYSIS, ROUTINE W REFLEX MICROSCOPIC
Bilirubin Urine: NEGATIVE
Glucose, UA: NEGATIVE
Hgb urine dipstick: NEGATIVE
Ketones, ur: NEGATIVE
Nitrite: NEGATIVE
Protein, ur: NEGATIVE
Specific Gravity, Urine: 1.027
Urobilinogen, UA: 0.2
pH: 5.5

## 2011-05-05 LAB — CBC
HCT: 34.2 — ABNORMAL LOW
Hemoglobin: 11.3 — ABNORMAL LOW
MCHC: 32.9
MCV: 80.3
Platelets: 256
RBC: 4.26
RDW: 14.9
WBC: 6.5

## 2011-05-08 ENCOUNTER — Emergency Department (HOSPITAL_COMMUNITY): Payer: Medicare Other

## 2011-05-08 ENCOUNTER — Emergency Department (HOSPITAL_COMMUNITY)
Admission: EM | Admit: 2011-05-08 | Discharge: 2011-05-08 | Disposition: A | Discharge status: Home / Self Care | Payer: Medicare Other | Attending: Emergency Medicine | Admitting: Emergency Medicine

## 2011-05-08 DIAGNOSIS — R6889 Other general symptoms and signs: Secondary | ICD-10-CM | POA: Insufficient documentation

## 2011-05-08 DIAGNOSIS — R0602 Shortness of breath: Secondary | ICD-10-CM | POA: Insufficient documentation

## 2011-05-08 DIAGNOSIS — F411 Generalized anxiety disorder: Secondary | ICD-10-CM | POA: Insufficient documentation

## 2011-05-08 DIAGNOSIS — R1310 Dysphagia, unspecified: Secondary | ICD-10-CM | POA: Insufficient documentation

## 2011-05-08 LAB — CBC
HCT: 41.6 % (ref 36.0–46.0)
Hemoglobin: 14 g/dL (ref 12.0–15.0)
MCH: 29.9 pg (ref 26.0–34.0)
MCHC: 33.7 g/dL (ref 30.0–36.0)
MCV: 88.7 fL (ref 78.0–100.0)
Platelets: 206 10*3/uL (ref 150–400)
RBC: 4.69 MIL/uL (ref 3.87–5.11)
RDW: 13.6 % (ref 11.5–15.5)
WBC: 7 10*3/uL (ref 4.0–10.5)

## 2011-05-08 LAB — DIFFERENTIAL
Basophils Absolute: 0 10*3/uL (ref 0.0–0.1)
Basophils Relative: 0 % (ref 0–1)
Eosinophils Absolute: 0.1 10*3/uL (ref 0.0–0.7)
Eosinophils Relative: 2 % (ref 0–5)
Lymphocytes Relative: 27 % (ref 12–46)
Lymphs Abs: 1.9 10*3/uL (ref 0.7–4.0)
Monocytes Absolute: 0.7 10*3/uL (ref 0.1–1.0)
Monocytes Relative: 10 % (ref 3–12)
Neutro Abs: 4.2 10*3/uL (ref 1.7–7.7)
Neutrophils Relative %: 61 % (ref 43–77)

## 2011-05-08 LAB — SEDIMENTATION RATE: Sed Rate: 24 mm/hr — ABNORMAL HIGH (ref 0–22)

## 2011-05-08 LAB — BASIC METABOLIC PANEL
BUN: 11 mg/dL (ref 6–23)
CO2: 27 mEq/L (ref 19–32)
Calcium: 9.6 mg/dL (ref 8.4–10.5)
Chloride: 101 mEq/L (ref 96–112)
Creatinine, Ser: 0.48 mg/dL — ABNORMAL LOW (ref 0.50–1.10)
GFR calc Af Amer: >60 mL/min (ref >60)
GFR calc non Af Amer: >60 mL/min (ref >60)
Glucose, Bld: 91 mg/dL (ref 70–99)
Potassium: 3.7 mEq/L (ref 3.5–5.1)
Sodium: 138 mEq/L (ref 135–145)

## 2011-05-09 ENCOUNTER — Encounter: Payer: Self-pay | Admitting: Gastroenterology

## 2011-05-09 ENCOUNTER — Telehealth: Payer: Self-pay | Admitting: Critical Care Medicine

## 2011-05-09 DIAGNOSIS — K219 Gastro-esophageal reflux disease without esophagitis: Secondary | ICD-10-CM

## 2011-05-09 DIAGNOSIS — R1310 Dysphagia, unspecified: Secondary | ICD-10-CM

## 2011-05-09 LAB — COMPREHENSIVE METABOLIC PANEL
ALT: 34
AST: 29
Albumin: 4
Alkaline Phosphatase: 61
BUN: 8
CO2: 23
Calcium: 9.1
Chloride: 106
Creatinine, Ser: 0.54
GFR calc Af Amer: >60
GFR calc non Af Amer: >60
Glucose, Bld: 103 — ABNORMAL HIGH
Potassium: 3.5
Sodium: 137
Total Bilirubin: 0.1 — ABNORMAL LOW
Total Protein: 7.5

## 2011-05-09 LAB — LIPASE, BLOOD: Lipase: 42

## 2011-05-09 LAB — DIFFERENTIAL
Basophils Absolute: 0.1
Basophils Relative: 2 — ABNORMAL HIGH
Eosinophils Absolute: 0.1
Eosinophils Relative: 1
Lymphocytes Relative: 16
Lymphs Abs: 1.3
Monocytes Absolute: 0.6
Monocytes Relative: 7
Neutro Abs: 6
Neutrophils Relative %: 75

## 2011-05-09 LAB — URINALYSIS, ROUTINE W REFLEX MICROSCOPIC
Bilirubin Urine: NEGATIVE
Glucose, UA: NEGATIVE
Hgb urine dipstick: NEGATIVE
Ketones, ur: NEGATIVE
Nitrite: NEGATIVE
Protein, ur: NEGATIVE
Specific Gravity, Urine: 1.024
Urobilinogen, UA: 0.2
pH: 5.5

## 2011-05-09 LAB — CBC
HCT: 37.5
Hemoglobin: 12.6
MCHC: 33.6
MCV: 78.4
Platelets: 246
RBC: 4.78
RDW: 16.7 — ABNORMAL HIGH
WBC: 8.1

## 2011-05-09 LAB — PREGNANCY, URINE: Preg Test, Ur: NEGATIVE

## 2011-05-09 LAB — D-DIMER, QUANTITATIVE: D-Dimer, Quant: 0.46

## 2011-05-09 NOTE — Telephone Encounter (Signed)
Pt is aware referral was sent

## 2011-05-09 NOTE — Telephone Encounter (Signed)
I spoke with pt and she states she is now wanting a referral to GI. Pt states she already had her botox injection and states her ENT is stating she needs to f/u with GI. Pt states she would like this set up right away bc she states she still can't breathe when she eats. Please advise Tammy. Thanks  Carver Fila, CMA

## 2011-05-09 NOTE — Telephone Encounter (Signed)
That is fine  Please send referral to GI for swallowing issues and GERD

## 2011-05-10 DIAGNOSIS — K2270 Barrett's esophagus without dysplasia: Secondary | ICD-10-CM | POA: Insufficient documentation

## 2011-05-11 LAB — COMPREHENSIVE METABOLIC PANEL
ALT: 11
ALT: 15
AST: 22
AST: 23
Albumin: 3.9
Albumin: 3.8
Alkaline Phosphatase: 57
Alkaline Phosphatase: 50
BUN: 12
BUN: 12
CO2: 24
CO2: 25
Calcium: 9
Calcium: 9
Chloride: 110
Chloride: 108
Creatinine, Ser: 0.6
Creatinine, Ser: 0.61
GFR calc Af Amer: >60
GFR calc Af Amer: >60
GFR calc non Af Amer: >60
GFR calc non Af Amer: >60
Glucose, Bld: 107 — ABNORMAL HIGH
Glucose, Bld: 86
Potassium: 3.6
Potassium: 3.3 — ABNORMAL LOW
Sodium: 144
Sodium: 143
Total Bilirubin: 0.3
Total Bilirubin: 0.3
Total Protein: 7.4
Total Protein: 6.6

## 2011-05-11 LAB — DIFFERENTIAL
Basophils Absolute: 0
Basophils Absolute: 0.1
Basophils Absolute: 0.1
Basophils Relative: 1
Basophils Relative: 1
Basophils Relative: 2 — ABNORMAL HIGH
Eosinophils Absolute: 0.1
Eosinophils Absolute: 0.1
Eosinophils Absolute: 0.1
Eosinophils Relative: 2
Eosinophils Relative: 2
Eosinophils Relative: 2
Lymphocytes Relative: 27
Lymphocytes Relative: 28
Lymphocytes Relative: 19
Lymphs Abs: 1.4
Lymphs Abs: 1.7
Lymphs Abs: 1.2
Monocytes Absolute: 0.5
Monocytes Absolute: 0.5
Monocytes Absolute: 0.6
Monocytes Relative: 10
Monocytes Relative: 8
Monocytes Relative: 9
Neutro Abs: 3
Neutro Abs: 3.9
Neutro Abs: 4.2
Neutrophils Relative %: 60
Neutrophils Relative %: 62
Neutrophils Relative %: 68

## 2011-05-11 LAB — POCT TOXICOLOGY PANEL
Amphetamines: NEGATIVE
Barbiturates: NEGATIVE
Benzodiazepines: NEGATIVE
Cocaine: NEGATIVE
Opiates: NEGATIVE
TCA Scrn: NEGATIVE
Tetrahydrocannabinol: NEGATIVE

## 2011-05-11 LAB — URINALYSIS, ROUTINE W REFLEX MICROSCOPIC
Bilirubin Urine: NEGATIVE
Bilirubin Urine: NEGATIVE
Bilirubin Urine: NEGATIVE
Glucose, UA: NEGATIVE
Glucose, UA: NEGATIVE
Glucose, UA: NEGATIVE
Hgb urine dipstick: NEGATIVE
Hgb urine dipstick: NEGATIVE
Hgb urine dipstick: NEGATIVE
Ketones, ur: 40 — AB
Ketones, ur: NEGATIVE
Ketones, ur: NEGATIVE
Nitrite: NEGATIVE
Nitrite: NEGATIVE
Nitrite: NEGATIVE
Protein, ur: NEGATIVE
Protein, ur: NEGATIVE
Protein, ur: NEGATIVE
Specific Gravity, Urine: 1.022
Specific Gravity, Urine: 1.023
Specific Gravity, Urine: 1.021
Urobilinogen, UA: 0.2
Urobilinogen, UA: 0.2
Urobilinogen, UA: 0.2
pH: 6
pH: 6.5
pH: 7.5

## 2011-05-11 LAB — BASIC METABOLIC PANEL
BUN: 10
CO2: 24
Calcium: 9.2
Chloride: 109
Creatinine, Ser: 0.6
GFR calc Af Amer: >60
GFR calc non Af Amer: >60
Glucose, Bld: 86
Potassium: 3.5
Sodium: 142

## 2011-05-11 LAB — POCT CARDIAC MARKERS
CKMB, poc: <1 — ABNORMAL LOW
CKMB, poc: <1 — ABNORMAL LOW
Myoglobin, poc: 21
Myoglobin, poc: 22.1
Operator id: 5507
Operator id: 288831
Troponin i, poc: <0.05
Troponin i, poc: <0.05

## 2011-05-11 LAB — CBC
HCT: 34.4 — ABNORMAL LOW
HCT: 34.8 — ABNORMAL LOW
HCT: 35.1 — ABNORMAL LOW
Hemoglobin: 11.6 — ABNORMAL LOW
Hemoglobin: 11.7 — ABNORMAL LOW
Hemoglobin: 11.7 — ABNORMAL LOW
MCHC: 33.6
MCHC: 33.7
MCHC: 33.2
MCV: 79.3
MCV: 79.4
MCV: 79.5
Platelets: 201
Platelets: 212
Platelets: 208
RBC: 4.33
RBC: 4.39
RBC: 4.42
RDW: 16.1 — ABNORMAL HIGH
RDW: 16.1 — ABNORMAL HIGH
RDW: 16.3 — ABNORMAL HIGH
WBC: 5
WBC: 6.3
WBC: 6.2

## 2011-05-11 LAB — POCT I-STAT, CHEM 8
BUN: 13
Calcium, Ion: 1.07 — ABNORMAL LOW
Chloride: 108
Creatinine, Ser: 0.8
Glucose, Bld: 99
HCT: 36
Hemoglobin: 12.2
Potassium: 3.3 — ABNORMAL LOW
Sodium: 142
TCO2: 24

## 2011-05-11 LAB — RAPID URINE DRUG SCREEN, HOSP PERFORMED
Amphetamines: NOT DETECTED
Barbiturates: NOT DETECTED
Benzodiazepines: NOT DETECTED
Cocaine: NOT DETECTED
Opiates: NOT DETECTED
Tetrahydrocannabinol: NOT DETECTED

## 2011-05-11 LAB — MAGNESIUM: Magnesium: 2.1

## 2011-05-11 LAB — PREGNANCY, URINE: Preg Test, Ur: NEGATIVE

## 2011-05-11 LAB — D-DIMER, QUANTITATIVE (NOT AT ARMC): D-Dimer, Quant: <0.22

## 2011-05-11 LAB — D-DIMER, QUANTITATIVE: D-Dimer, Quant: <0.22

## 2011-05-15 LAB — POCT I-STAT, CHEM 8
BUN: 10
Calcium, Ion: 1.05 — ABNORMAL LOW
Chloride: 109
Creatinine, Ser: 0.8
Glucose, Bld: 83
HCT: 38
Hemoglobin: 12.9
Potassium: 3.7
Sodium: 140
TCO2: 23

## 2011-05-15 LAB — COMPREHENSIVE METABOLIC PANEL
ALT: 16
AST: 19
Albumin: 4
Alkaline Phosphatase: 50
BUN: 7
CO2: 25
Calcium: 9.3
Chloride: 107
Creatinine, Ser: 0.53
GFR calc Af Amer: >60
GFR calc non Af Amer: >60
Glucose, Bld: 90
Potassium: 3.7
Sodium: 140
Total Bilirubin: 0.4
Total Protein: 6.9

## 2011-05-15 LAB — COMPREHENSIVE METABOLIC PANEL WITH GFR
ALT: 14
AST: 19
Albumin: 4.3
Alkaline Phosphatase: 62
BUN: 14
CO2: 27
Calcium: 9.4
Chloride: 108
Creatinine, Ser: 0.7
GFR calc non Af Amer: >60
Glucose, Bld: 86
Potassium: 4
Sodium: 143
Total Bilirubin: 0.3
Total Protein: 7.4

## 2011-05-15 LAB — DIFFERENTIAL
Basophils Absolute: 0
Basophils Absolute: 0.1
Basophils Relative: 1
Basophils Relative: 2 — ABNORMAL HIGH
Eosinophils Absolute: 0.1
Eosinophils Absolute: 0.1
Eosinophils Relative: 1
Eosinophils Relative: 3
Lymphocytes Relative: 22
Lymphocytes Relative: 26
Lymphs Abs: 1.4
Lymphs Abs: 1.4
Monocytes Absolute: 0.4
Monocytes Absolute: 0.5
Monocytes Relative: 7
Monocytes Relative: 9
Neutro Abs: 3.4
Neutro Abs: 4.2
Neutrophils Relative %: 62
Neutrophils Relative %: 68

## 2011-05-15 LAB — URINALYSIS, ROUTINE W REFLEX MICROSCOPIC
Bilirubin Urine: NEGATIVE
Bilirubin Urine: NEGATIVE
Glucose, UA: NEGATIVE
Glucose, UA: NEGATIVE
Hgb urine dipstick: NEGATIVE
Hgb urine dipstick: NEGATIVE
Ketones, ur: 40 — AB
Ketones, ur: NEGATIVE
Nitrite: NEGATIVE
Nitrite: NEGATIVE
Protein, ur: NEGATIVE
Protein, ur: NEGATIVE
Specific Gravity, Urine: 1.028
Specific Gravity, Urine: 1.028
Urobilinogen, UA: 0.2
Urobilinogen, UA: 0.2
pH: 5.5
pH: 6

## 2011-05-15 LAB — APTT: aPTT: 28

## 2011-05-15 LAB — LIPASE, BLOOD
Lipase: 264
Lipase: 38

## 2011-05-15 LAB — PREGNANCY, URINE
Preg Test, Ur: NEGATIVE
Preg Test, Ur: NEGATIVE

## 2011-05-15 LAB — CBC
HCT: 35.8 — ABNORMAL LOW
HCT: 38.7
Hemoglobin: 11.8 — ABNORMAL LOW
Hemoglobin: 12.4
MCHC: 32.1
MCHC: 32.8
MCV: 80.6
MCV: 81.2
Platelets: 218
Platelets: 232
RBC: 4.42
RBC: 4.8
RDW: 14.7
RDW: 15.2
WBC: 5.4
WBC: 6.2

## 2011-05-15 LAB — PROTIME-INR
INR: 1
Prothrombin Time: 13.1

## 2011-05-15 LAB — MAGNESIUM: Magnesium: 2.3

## 2011-05-16 LAB — POCT I-STAT, CHEM 8
BUN: 14
BUN: 9
Calcium, Ion: 1.1 — ABNORMAL LOW
Calcium, Ion: 1.19
Chloride: 107
Chloride: 109
Creatinine, Ser: 0.7
Creatinine, Ser: 0.8
Glucose, Bld: 102 — ABNORMAL HIGH
Glucose, Bld: 90
HCT: 36
HCT: 39
Hemoglobin: 12.2
Hemoglobin: 13.3
Potassium: 3.8
Potassium: 3.8
Sodium: 142
Sodium: 142
TCO2: 24
TCO2: 26

## 2011-05-17 LAB — COMPREHENSIVE METABOLIC PANEL
ALT: 21
AST: 19
Albumin: 3.6
Alkaline Phosphatase: 52
BUN: 12
CO2: 22
Calcium: 9
Chloride: 110
Creatinine, Ser: 0.68
GFR calc Af Amer: >60
GFR calc non Af Amer: >60
Glucose, Bld: 101 — ABNORMAL HIGH
Potassium: 3.8
Sodium: 140
Total Bilirubin: 0.3
Total Protein: 6.6

## 2011-05-17 LAB — URINALYSIS, ROUTINE W REFLEX MICROSCOPIC
Bilirubin Urine: NEGATIVE
Glucose, UA: NEGATIVE
Hgb urine dipstick: NEGATIVE
Ketones, ur: NEGATIVE
Nitrite: NEGATIVE
Protein, ur: NEGATIVE
Specific Gravity, Urine: 1.024
Urobilinogen, UA: 0.2
pH: 7.5

## 2011-05-17 LAB — CK TOTAL AND CKMB (NOT AT ARMC)
CK, MB: 0.5
CK, MB: 0.6
Relative Index: INVALID
Relative Index: INVALID
Total CK: 24
Total CK: 25

## 2011-05-17 LAB — BASIC METABOLIC PANEL
BUN: 12
BUN: 14
BUN: 16
CO2: 24
CO2: 24
CO2: 25
Calcium: 8.7
Calcium: 9
Calcium: 9.4
Chloride: 110
Chloride: 111
Chloride: 108
Creatinine, Ser: 0.5
Creatinine, Ser: 0.58
Creatinine, Ser: 0.6
GFR calc Af Amer: >60
GFR calc Af Amer: >60
GFR calc Af Amer: >60
GFR calc non Af Amer: >60
GFR calc non Af Amer: >60
GFR calc non Af Amer: >60
Glucose, Bld: 124 — ABNORMAL HIGH
Glucose, Bld: 93
Glucose, Bld: 84
Potassium: 3.5
Potassium: 3.6
Potassium: 4.3
Sodium: 139
Sodium: 139
Sodium: 143

## 2011-05-17 LAB — CBC
HCT: 33.7 — ABNORMAL LOW
HCT: 33.8 — ABNORMAL LOW
HCT: 35.4 — ABNORMAL LOW
Hemoglobin: 11.1 — ABNORMAL LOW
Hemoglobin: 11.1 — ABNORMAL LOW
Hemoglobin: 11.8 — ABNORMAL LOW
MCHC: 32.8
MCHC: 33.1
MCHC: 33.3
MCV: 81.6
MCV: 81.8
MCV: 80.1
Platelets: 194
Platelets: 232
Platelets: 215
RBC: 4.11
RBC: 4.14
RBC: 4.42
RDW: 16.6 — ABNORMAL HIGH
RDW: 16.6 — ABNORMAL HIGH
RDW: 15.4
WBC: 5.7
WBC: 6.8
WBC: 6.3

## 2011-05-17 LAB — POCT I-STAT, CHEM 8
BUN: 13
BUN: 15
Calcium, Ion: 1.11 — ABNORMAL LOW
Calcium, Ion: 1.2
Chloride: 108
Chloride: 111
Creatinine, Ser: 0.6
Creatinine, Ser: 0.9
Glucose, Bld: 100 — ABNORMAL HIGH
Glucose, Bld: 127 — ABNORMAL HIGH
HCT: 34 — ABNORMAL LOW
HCT: 36
Hemoglobin: 11.6 — ABNORMAL LOW
Hemoglobin: 12.2
Potassium: 3.5
Potassium: 3.9
Sodium: 141
Sodium: 142
TCO2: 21
TCO2: 24

## 2011-05-17 LAB — DIFFERENTIAL
Basophils Absolute: 0
Basophils Relative: 1
Eosinophils Absolute: 0.1
Eosinophils Relative: 1
Lymphocytes Relative: 22
Lymphs Abs: 1.4
Monocytes Absolute: 0.5
Monocytes Relative: 8
Neutro Abs: 4.3
Neutrophils Relative %: 68

## 2011-05-17 LAB — B-NATRIURETIC PEPTIDE (CONVERTED LAB): Pro B Natriuretic peptide (BNP): <30

## 2011-05-17 LAB — CARDIAC PANEL(CRET KIN+CKTOT+MB+TROPI)
CK, MB: 0.4
CK, MB: 0.6
Relative Index: INVALID
Relative Index: INVALID
Total CK: 18
Total CK: 23
Troponin I: 0.01
Troponin I: <0.01

## 2011-05-17 LAB — POCT CARDIAC MARKERS
CKMB, poc: <1 — ABNORMAL LOW
CKMB, poc: <1 — ABNORMAL LOW
CKMB, poc: <1 — ABNORMAL LOW
Myoglobin, poc: 22.3
Myoglobin, poc: 28.3
Myoglobin, poc: 15.7
Troponin i, poc: <0.05
Troponin i, poc: <0.05
Troponin i, poc: <0.05

## 2011-05-17 LAB — LIPID PANEL
Cholesterol: 192
HDL: 30 — ABNORMAL LOW
LDL Cholesterol: 140 — ABNORMAL HIGH
Total CHOL/HDL Ratio: 6.4
Triglycerides: 108
VLDL: 22

## 2011-05-17 LAB — D-DIMER, QUANTITATIVE: D-Dimer, Quant: 0.26

## 2011-05-17 LAB — PROTIME-INR
INR: 1
INR: 1
Prothrombin Time: 12.9
Prothrombin Time: 13.4

## 2011-05-17 LAB — APTT
aPTT: 30
aPTT: 31

## 2011-05-17 LAB — POCT PREGNANCY, URINE: Preg Test, Ur: NEGATIVE

## 2011-05-17 LAB — MAGNESIUM
Magnesium: 2.1
Magnesium: 2.3

## 2011-05-17 LAB — TSH: TSH: 1.928

## 2011-05-17 LAB — TROPONIN I
Troponin I: 0.01
Troponin I: 0.01

## 2011-05-18 ENCOUNTER — Encounter (HOSPITAL_BASED_OUTPATIENT_CLINIC_OR_DEPARTMENT_OTHER): Payer: Self-pay | Admitting: Emergency Medicine

## 2011-05-18 ENCOUNTER — Emergency Department (INDEPENDENT_AMBULATORY_CARE_PROVIDER_SITE_OTHER): Payer: Medicare Other

## 2011-05-18 ENCOUNTER — Emergency Department (HOSPITAL_BASED_OUTPATIENT_CLINIC_OR_DEPARTMENT_OTHER)
Admission: EM | Admit: 2011-05-18 | Discharge: 2011-05-18 | Disposition: A | Discharge status: Home / Self Care | Payer: Medicare Other | Attending: Emergency Medicine | Admitting: Emergency Medicine

## 2011-05-18 ENCOUNTER — Ambulatory Visit: Payer: Medicare Other | Admitting: Critical Care Medicine

## 2011-05-18 DIAGNOSIS — R079 Chest pain, unspecified: Secondary | ICD-10-CM | POA: Insufficient documentation

## 2011-05-18 DIAGNOSIS — G8929 Other chronic pain: Secondary | ICD-10-CM

## 2011-05-18 DIAGNOSIS — R0602 Shortness of breath: Secondary | ICD-10-CM
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
## 2011-05-18 NOTE — ED Notes (Signed)
Pt c/o "lungs hurt" since last pm; sts. This feels similar to when she has had bronchitis in the past.  Also c/o dizziness & lightheadedness- recent admission to [facility] for dehydration.

## 2011-05-23 ENCOUNTER — Ambulatory Visit (INDEPENDENT_AMBULATORY_CARE_PROVIDER_SITE_OTHER): Payer: Medicare Other | Admitting: Family Medicine

## 2011-05-23 ENCOUNTER — Encounter: Payer: Self-pay | Admitting: Family Medicine

## 2011-05-23 VITALS — BP 146/97 | HR 75 | Wt 178.0 lb

## 2011-05-23 DIAGNOSIS — R7989 Other specified abnormal findings of blood chemistry: Secondary | ICD-10-CM

## 2011-05-23 DIAGNOSIS — R7309 Other abnormal glucose: Secondary | ICD-10-CM

## 2011-05-23 DIAGNOSIS — F419 Anxiety disorder, unspecified: Secondary | ICD-10-CM

## 2011-05-23 DIAGNOSIS — F411 Generalized anxiety disorder: Secondary | ICD-10-CM

## 2011-05-23 DIAGNOSIS — R799 Abnormal finding of blood chemistry, unspecified: Secondary | ICD-10-CM

## 2011-05-23 LAB — CBC
HCT: 35.5 — ABNORMAL LOW
Hemoglobin: 11.8 — ABNORMAL LOW
MCHC: 33.2
MCV: 80.6
Platelets: 280
RBC: 4.4
RDW: 15.3 — ABNORMAL HIGH
WBC: 9

## 2011-05-23 LAB — DIFFERENTIAL
Basophils Absolute: 0
Basophils Relative: 0
Eosinophils Absolute: 0.2
Eosinophils Relative: 2
Lymphocytes Relative: 18
Lymphs Abs: 1.6
Monocytes Absolute: 0.5
Monocytes Relative: 6
Neutro Abs: 6.6
Neutrophils Relative %: 74

## 2011-05-23 LAB — PREGNANCY, URINE: Preg Test, Ur: NEGATIVE

## 2011-05-23 LAB — URINALYSIS, ROUTINE W REFLEX MICROSCOPIC
Bilirubin Urine: NEGATIVE
Glucose, UA: NEGATIVE
Hgb urine dipstick: NEGATIVE
Ketones, ur: NEGATIVE
Nitrite: NEGATIVE
Protein, ur: NEGATIVE
Specific Gravity, Urine: 1.023
Urobilinogen, UA: 0.2
pH: 7

## 2011-05-23 LAB — POCT CARDIAC MARKERS
CKMB, poc: <1 — ABNORMAL LOW
Myoglobin, poc: 24.7
Operator id: 4661
Troponin i, poc: <0.05

## 2011-05-23 LAB — COMPREHENSIVE METABOLIC PANEL
ALT: 21
AST: 15
Albumin: 3.9
Alkaline Phosphatase: 60
BUN: 12
CO2: 24
Calcium: 9
Chloride: 108
Creatinine, Ser: 0.84
GFR calc Af Amer: >60
GFR calc non Af Amer: >60
Glucose, Bld: 98
Potassium: 3.9
Sodium: 139
Total Bilirubin: 0.5
Total Protein: 7

## 2011-05-23 LAB — HEMOGLOBIN A1C
Hgb A1c MFr Bld: 5.8 % — ABNORMAL HIGH (ref <5.7)
Mean Plasma Glucose: 120 mg/dL — ABNORMAL HIGH (ref <117)

## 2011-05-23 LAB — D-DIMER, QUANTITATIVE: D-Dimer, Quant: 0.3
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
## 2011-05-24 ENCOUNTER — Telehealth: Payer: Self-pay | Admitting: *Deleted

## 2011-05-24 LAB — COMPLETE METABOLIC PANEL WITH GFR
ALT: 18 U/L (ref 0–35)
AST: 15 U/L (ref 0–37)
Albumin: 4.2 g/dL (ref 3.5–5.2)
Alkaline Phosphatase: 57 U/L (ref 39–117)
BUN: 10 mg/dL (ref 6–23)
CO2: 23 mEq/L (ref 19–32)
Calcium: 8.5 mg/dL (ref 8.4–10.5)
Chloride: 111 mEq/L (ref 96–112)
Creat: 0.51 mg/dL (ref 0.50–1.10)
GFR, Est African American: >60 mL/min (ref >60)
GFR, Est Non African American: >60 mL/min (ref >60)
Glucose, Bld: 114 mg/dL — ABNORMAL HIGH (ref 70–99)
Potassium: 3.7 mEq/L (ref 3.5–5.3)
Sodium: 145 mEq/L (ref 135–145)
Total Bilirubin: 0.2 mg/dL — ABNORMAL LOW (ref 0.3–1.2)
Total Protein: 6.7 g/dL (ref 6.0–8.3)

## 2011-05-24 NOTE — Telephone Encounter (Signed)
Pt.notified

## 2011-05-24 NOTE — Telephone Encounter (Signed)
Message copied by Wyline Beady on Wed May 24, 2011  3:42 PM ------      Message from: Nani Gasser D      Created: Wed May 24, 2011  7:43 AM       Metabolic panel is normal. Her potassium is 3.7. I sent her to eat potassium rich diet. Her hemoglobin A1c is 5.8. This is in the insulin resistant or prediabetic range. We will keep an eye on this and recheck her A1c in 6 months. Encourage her to watch her portion sizes on her carbohydrates and to avoid sweet foods and beverages.

## 2011-05-25 ENCOUNTER — Ambulatory Visit (INDEPENDENT_AMBULATORY_CARE_PROVIDER_SITE_OTHER): Payer: Medicare Other | Admitting: Gastroenterology

## 2011-05-25 ENCOUNTER — Encounter: Payer: Self-pay | Admitting: Gastroenterology

## 2011-05-25 DIAGNOSIS — R1310 Dysphagia, unspecified: Secondary | ICD-10-CM

## 2011-05-25 DIAGNOSIS — R109 Unspecified abdominal pain: Secondary | ICD-10-CM

## 2011-05-25 NOTE — Assessment & Plan Note (Addendum)
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 There may be some cricopharyngeal dysfunction but I think it's unlikely that she has a significant motility disorder. There is no evidence for esophageal stricture.  Recommendations #1 gastric emptying scan (patient has not tolerated Reglan in the past)

## 2011-05-25 NOTE — Progress Notes (Signed)
Jennifer [name] is a 45 year old white female referred at the request of Dr. Delford Field for evaluation of dysphagia and frequent regurgitation. She has a long GI history and has undergone extensive evaluation in the past including esophageal manometry, upper endoscopy, barium studies and gastric emptying scan. Endoscopy in February, 2012 was remarkable only for a dilated esophagus. In June, 2009 she underwent flexible endoscopic evaluation swallowing study. Examination was normal.  She complains of difficulty initiating a swallow and with food lodging in her throat or upper esophagus. She's undergone a couple of Botox injections into her cricopharyngeal muscle. She also has constant regurgitation with eructations. At times she is had a frank projectile vomiting without antecedent nausea.  History of Present Illness:      Review of Systems: Pertinent positive and negative review of systems were noted in the above HPI section. All other review of systems were otherwise negative.    Current Medications, Allergies, Past Medical History, Past Surgical History, Family History and Social History were reviewed in [employer] [facility] electronic medical record  Vital signs were reviewed in today's medical record. Physical Exam:  The patient is visibly swallowing air and then regurgitating General: Well developed , well nourished, no acute distress Head: Normocephalic and atraumatic Eyes:  sclerae anicteric, EOMI Ears: Normal auditory acuity Mouth: No deformity or lesions Lungs: Clear throughout to auscultation Heart: Regular rate and rhythm; no murmurs, rubs or bruits Abdomen: Soft, non tender and non distended. No masses, hepatosplenomegaly or hernias noted. Normal Bowel sounds Rectal:deferred Musculoskeletal: Symmetrical with no gross deformities  Pulses:  Normal pulses noted Extremities: No clubbing, cyanosis, edema or deformities noted Neurological: Alert oriented x 4, grossly nonfocal Psychological:  Alert  and cooperative. Normal mood and affect

## 2011-05-25 NOTE — Patient Instructions (Signed)
Your Test is scheduled on 06/15/2011 at 1pm at [facility] radiology No stomach medications 24 hours prior to your test Nothing to eat or drink 8 hours prior to your test

## 2011-05-30 ENCOUNTER — Emergency Department (INDEPENDENT_AMBULATORY_CARE_PROVIDER_SITE_OTHER): Payer: Medicare Other

## 2011-05-30 ENCOUNTER — Emergency Department (HOSPITAL_BASED_OUTPATIENT_CLINIC_OR_DEPARTMENT_OTHER)
Admission: EM | Admit: 2011-05-30 | Discharge: 2011-05-30 | Disposition: A | Discharge status: Home / Self Care | Payer: Medicare Other | Attending: Emergency Medicine | Admitting: Emergency Medicine

## 2011-05-30 ENCOUNTER — Encounter (HOSPITAL_BASED_OUTPATIENT_CLINIC_OR_DEPARTMENT_OTHER): Payer: Self-pay | Admitting: Emergency Medicine

## 2011-05-30 DIAGNOSIS — K3189 Other diseases of stomach and duodenum: Secondary | ICD-10-CM

## 2011-05-30 DIAGNOSIS — G4730 Sleep apnea, unspecified: Secondary | ICD-10-CM | POA: Insufficient documentation

## 2011-05-30 DIAGNOSIS — E785 Hyperlipidemia, unspecified: Secondary | ICD-10-CM | POA: Insufficient documentation

## 2011-05-30 DIAGNOSIS — I10 Essential (primary) hypertension: Secondary | ICD-10-CM | POA: Insufficient documentation

## 2011-05-30 DIAGNOSIS — R1310 Dysphagia, unspecified: Secondary | ICD-10-CM

## 2011-05-30 DIAGNOSIS — R059 Cough, unspecified: Secondary | ICD-10-CM

## 2011-05-30 DIAGNOSIS — R05 Cough: Secondary | ICD-10-CM

## 2011-05-30 DIAGNOSIS — K219 Gastro-esophageal reflux disease without esophagitis: Secondary | ICD-10-CM | POA: Insufficient documentation

## 2011-05-30 DIAGNOSIS — R1013 Epigastric pain: Secondary | ICD-10-CM

## 2011-05-30 DIAGNOSIS — F17200 Nicotine dependence, unspecified, uncomplicated: Secondary | ICD-10-CM | POA: Insufficient documentation

## 2011-05-30 LAB — CBC
HCT: 36.3 % (ref 36.0–46.0)
Hemoglobin: 12.2 g/dL (ref 12.0–15.0)
MCH: 30 pg (ref 26.0–34.0)
MCHC: 33.6 g/dL (ref 30.0–36.0)
MCV: 89.2 fL (ref 78.0–100.0)
Platelets: 203 10*3/uL (ref 150–400)
RBC: 4.07 MIL/uL (ref 3.87–5.11)
RDW: 13.4 % (ref 11.5–15.5)
WBC: 7.2 10*3/uL (ref 4.0–10.5)

## 2011-05-30 LAB — BASIC METABOLIC PANEL
BUN: 9 mg/dL (ref 6–23)
CO2: 24 mEq/L (ref 19–32)
Calcium: 8.5 mg/dL (ref 8.4–10.5)
Chloride: 105 mEq/L (ref 96–112)
Creatinine, Ser: 0.5 mg/dL (ref 0.50–1.10)
GFR calc Af Amer: >90 mL/min (ref >90)
GFR calc non Af Amer: >90 mL/min (ref >90)
Glucose, Bld: 91 mg/dL (ref 70–99)
Potassium: 3.5 mEq/L (ref 3.5–5.1)
Sodium: 139 mEq/L (ref 135–145)

## 2011-05-30 MED ORDER — ONDANSETRON HCL 4 MG/2ML IJ SOLN
4.0000 mg | Freq: Once | INTRAMUSCULAR | Status: AC
  Administered 2011-05-30: 4 mg via INTRAVENOUS
  Filled 2011-05-30: qty 2

## 2011-05-30 MED ORDER — SODIUM CHLORIDE 0.9 % IV BOLUS (SEPSIS)
250.0000 mL | Freq: Once | INTRAVENOUS | Status: AC
  Administered 2011-05-30: 250 mL via INTRAVENOUS

## 2011-05-30 MED ORDER — IOHEXOL 300 MG/ML  SOLN
75.0000 mL | Freq: Once | INTRAMUSCULAR | Status: AC | PRN
  Administered 2011-05-30: 75 mL via INTRAVENOUS

## 2011-05-30 MED ORDER — SODIUM CHLORIDE 0.9 % IV SOLN: INTRAVENOUS | Status: DC

## 2011-05-30 NOTE — ED Notes (Signed)
Pt report burping and air bubbles in throat affecting swallowing
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
## 2011-06-01 ENCOUNTER — Ambulatory Visit (INDEPENDENT_AMBULATORY_CARE_PROVIDER_SITE_OTHER): Payer: Medicare Other | Admitting: Critical Care Medicine

## 2011-06-01 ENCOUNTER — Encounter: Payer: Self-pay | Admitting: Critical Care Medicine

## 2011-06-01 DIAGNOSIS — J383 Other diseases of vocal cords: Secondary | ICD-10-CM

## 2011-06-01 MED ORDER — FLUTICASONE FUROATE 27.5 MCG/SPRAY NA SUSP: 2.0000 | Freq: Every day | NASAL | Status: DC

## 2011-06-01 MED ORDER — FLUTTER DEVI: Status: DC

## 2011-06-01 NOTE — Patient Instructions (Addendum)
Get back on QNasl  two sprays each nostril daily Use Flutter Valve 2-4 times daily for mucus in throat Return as needed
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
## 2011-06-01 NOTE — Assessment & Plan Note (Signed)
S/p botox injection to throat with postop pooling of secretions and gas into hypopharynx but good restoration of vocal cords associated postnasal drip syndrome with mucus plugging in upper airway. Associated usual psychogenic somatization features Plan of psychotherapy given Flutter valve given Pt to use Qnasl more regularly rov prn F/u with [facility] ENT encouraged Pathophysiology of gurgling in throat explained to the pt and she verbalized understanding

## 2011-06-11 ENCOUNTER — Encounter (HOSPITAL_BASED_OUTPATIENT_CLINIC_OR_DEPARTMENT_OTHER): Payer: Self-pay | Admitting: *Deleted

## 2011-06-11 ENCOUNTER — Emergency Department (INDEPENDENT_AMBULATORY_CARE_PROVIDER_SITE_OTHER): Payer: Medicare Other

## 2011-06-11 ENCOUNTER — Emergency Department (HOSPITAL_BASED_OUTPATIENT_CLINIC_OR_DEPARTMENT_OTHER)
Admission: EM | Admit: 2011-06-11 | Discharge: 2011-06-11 | Disposition: A | Discharge status: Home / Self Care | Payer: Medicare Other | Attending: Emergency Medicine | Admitting: Emergency Medicine

## 2011-06-11 DIAGNOSIS — R109 Unspecified abdominal pain: Secondary | ICD-10-CM | POA: Insufficient documentation

## 2011-06-11 DIAGNOSIS — Z87891 Personal history of nicotine dependence: Secondary | ICD-10-CM | POA: Insufficient documentation

## 2011-06-11 DIAGNOSIS — G4730 Sleep apnea, unspecified: Secondary | ICD-10-CM | POA: Insufficient documentation

## 2011-06-11 DIAGNOSIS — K219 Gastro-esophageal reflux disease without esophagitis: Secondary | ICD-10-CM | POA: Insufficient documentation

## 2011-06-11 DIAGNOSIS — M549 Dorsalgia, unspecified: Secondary | ICD-10-CM | POA: Insufficient documentation

## 2011-06-11 DIAGNOSIS — Z79899 Other long term (current) drug therapy: Secondary | ICD-10-CM | POA: Insufficient documentation

## 2011-06-11 DIAGNOSIS — I10 Essential (primary) hypertension: Secondary | ICD-10-CM | POA: Insufficient documentation

## 2011-06-11 DIAGNOSIS — K5900 Constipation, unspecified: Secondary | ICD-10-CM | POA: Insufficient documentation

## 2011-06-11 LAB — URINALYSIS, ROUTINE W REFLEX MICROSCOPIC
Bilirubin Urine: NEGATIVE
Glucose, UA: NEGATIVE mg/dL
Hgb urine dipstick: NEGATIVE
Ketones, ur: NEGATIVE mg/dL
Leukocytes, UA: NEGATIVE
Nitrite: NEGATIVE
Protein, ur: NEGATIVE mg/dL
Specific Gravity, Urine: 1.026 (ref 1.005–1.030)
Urobilinogen, UA: 0.2 mg/dL (ref 0.0–1.0)
pH: 6 (ref 5.0–8.0)

## 2011-06-11 IMAGING — CR DG CHEST 2V
2 series · 2 of 2 positions shown · non-contrast
Comparison: 11/26/2009.

CLINICAL DATA: Upper back pain.  Abdominal pain.

CHEST - 2 VIEW

[w chest pa]
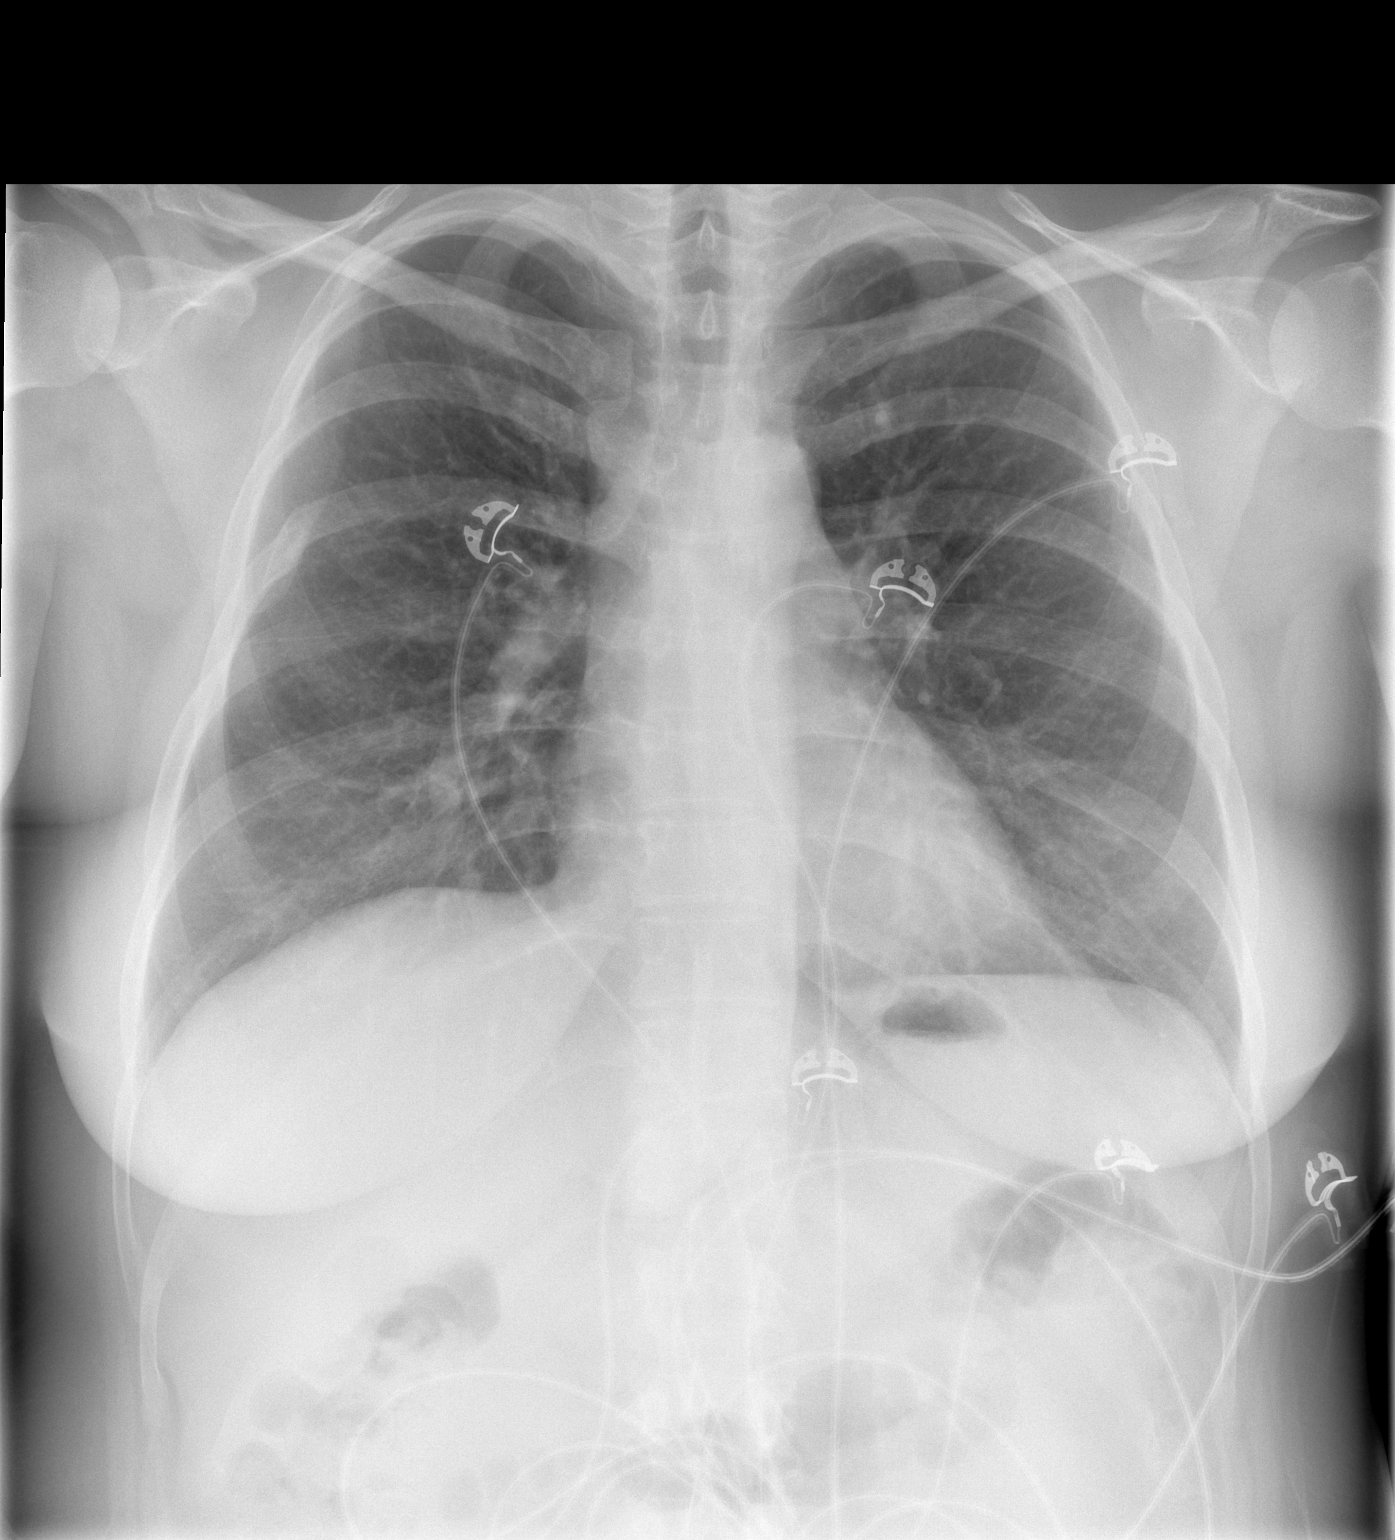

[w chest lat]
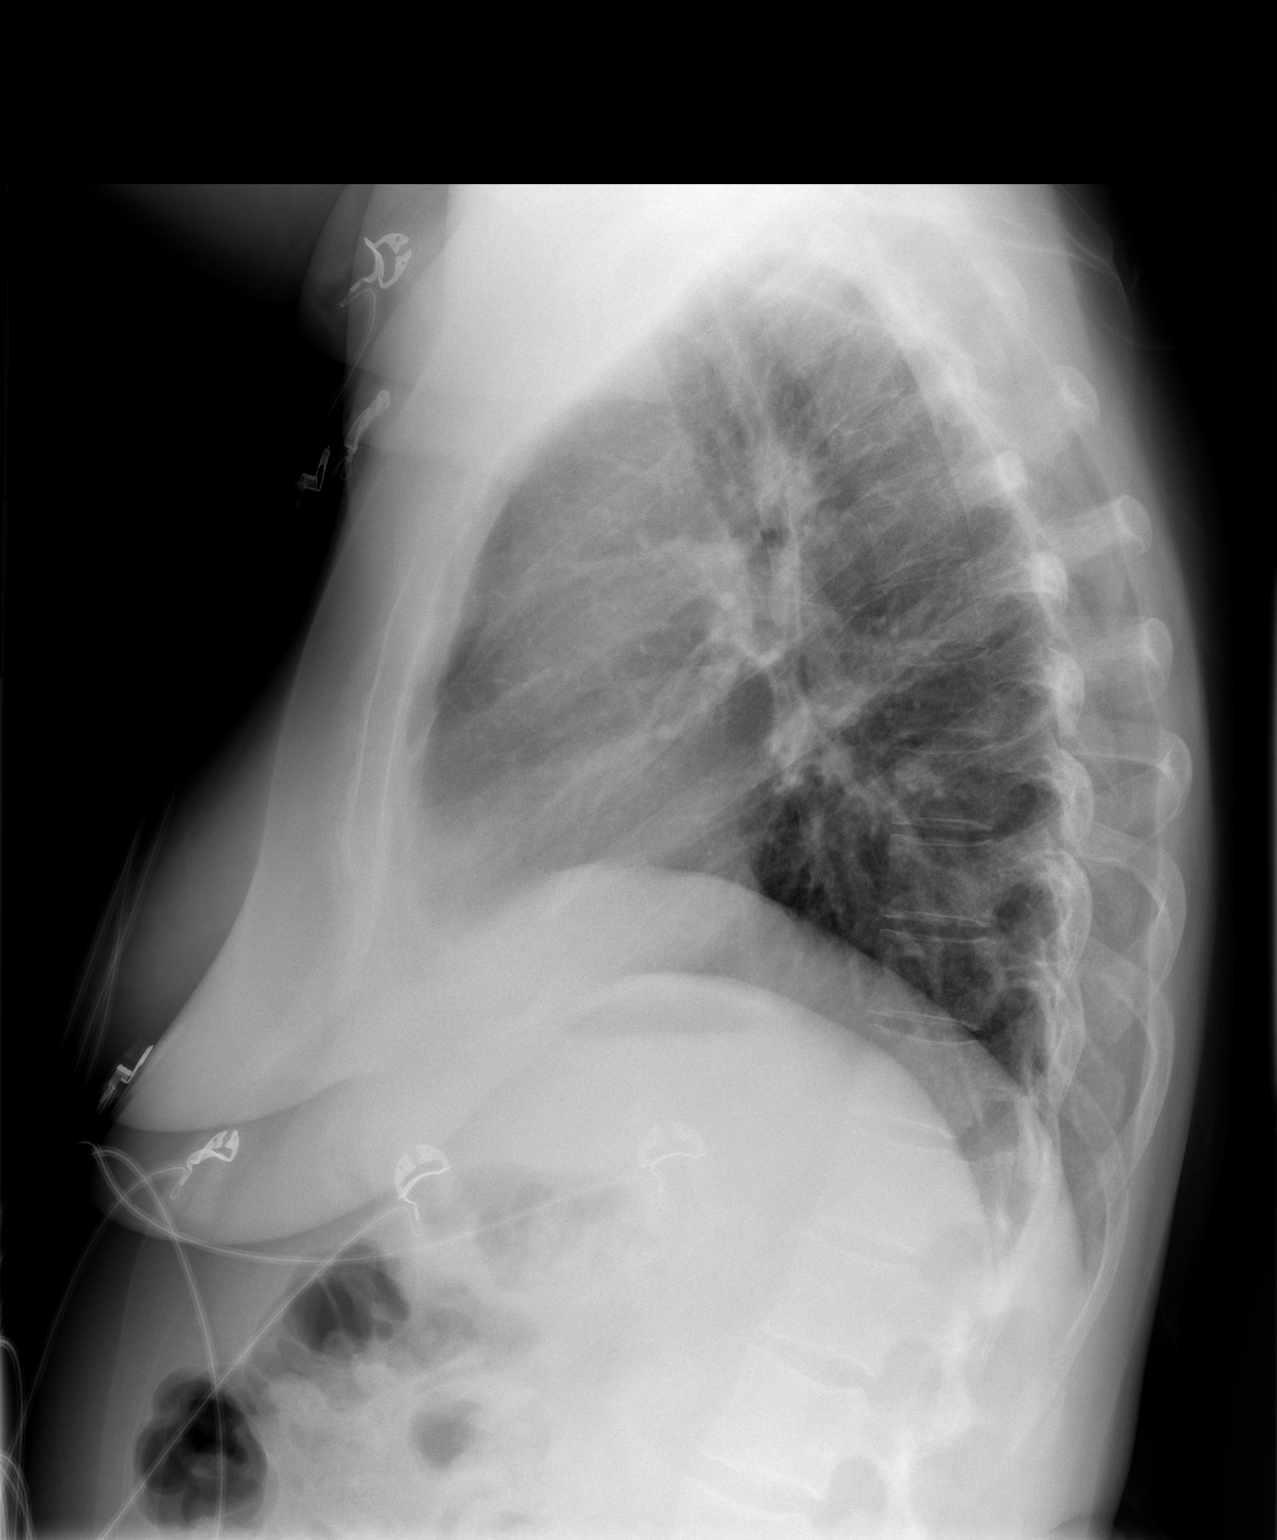

[2 of 2 positions shown; findings below may reference images not displayed]

FINDINGS: Lungs clear.  Cardiopericardial silhouette within normal
limits.  Old right rib fracture.  Prominent pulmonary vessel
projects over the left posterior fifth rib.  No effusion.  No
airspace disease.
IMPRESSION: No active cardiopulmonary disease.

## 2011-06-11 MED ORDER — PEG 3350-KCL-NABCB-NACL-NASULF 236 G PO SOLR: ORAL | Status: DC
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
## 2011-06-11 NOTE — ED Notes (Signed)
Pt states with multiple other problems states that the local hospitals are not taking her serious and that they actually found something at [facility]  and she was admitted she states that she shouldn't have to go to [facility] to be cared for

## 2011-06-11 NOTE — ED Notes (Signed)
Pt d/c home with rx for golytely- Instructions for home care discussed at length with patient

## 2011-06-11 NOTE — ED Notes (Signed)
Pt states that if they had done an xray last time they would have found out she is full of poop pt states that she is having abd pain and distention pt has used OTC laxitives with no relief pt reports pain in right upper abd and groin as well as a lot of belching

## 2011-06-12 ENCOUNTER — Other Ambulatory Visit (HOSPITAL_COMMUNITY): Payer: Medicare Other

## 2011-06-15 ENCOUNTER — Ambulatory Visit: Payer: Medicare Other | Admitting: Family Medicine

## 2011-06-15 ENCOUNTER — Other Ambulatory Visit (HOSPITAL_COMMUNITY): Payer: Medicare Other

## 2011-06-15 ENCOUNTER — Encounter (HOSPITAL_BASED_OUTPATIENT_CLINIC_OR_DEPARTMENT_OTHER): Payer: Self-pay | Admitting: *Deleted

## 2011-06-15 ENCOUNTER — Emergency Department (HOSPITAL_BASED_OUTPATIENT_CLINIC_OR_DEPARTMENT_OTHER)
Admission: EM | Admit: 2011-06-15 | Discharge: 2011-06-15 | Disposition: A | Discharge status: Home / Self Care | Payer: Medicare Other | Attending: Emergency Medicine | Admitting: Emergency Medicine

## 2011-06-15 ENCOUNTER — Emergency Department (INDEPENDENT_AMBULATORY_CARE_PROVIDER_SITE_OTHER): Payer: Medicare Other

## 2011-06-15 DIAGNOSIS — R1011 Right upper quadrant pain: Secondary | ICD-10-CM

## 2011-06-15 DIAGNOSIS — G4730 Sleep apnea, unspecified: Secondary | ICD-10-CM | POA: Insufficient documentation

## 2011-06-15 DIAGNOSIS — R110 Nausea: Secondary | ICD-10-CM

## 2011-06-15 DIAGNOSIS — K5900 Constipation, unspecified: Secondary | ICD-10-CM | POA: Insufficient documentation

## 2011-06-15 DIAGNOSIS — J45909 Unspecified asthma, uncomplicated: Secondary | ICD-10-CM | POA: Insufficient documentation

## 2011-06-15 DIAGNOSIS — Z0289 Encounter for other administrative examinations: Secondary | ICD-10-CM

## 2011-06-15 DIAGNOSIS — K7689 Other specified diseases of liver: Secondary | ICD-10-CM

## 2011-06-15 DIAGNOSIS — I10 Essential (primary) hypertension: Secondary | ICD-10-CM | POA: Insufficient documentation

## 2011-06-15 DIAGNOSIS — E785 Hyperlipidemia, unspecified: Secondary | ICD-10-CM | POA: Insufficient documentation

## 2011-06-15 DIAGNOSIS — Z79899 Other long term (current) drug therapy: Secondary | ICD-10-CM | POA: Insufficient documentation

## 2011-06-15 LAB — COMPREHENSIVE METABOLIC PANEL
ALT: 16 U/L (ref 0–35)
AST: 12 U/L (ref 0–37)
Albumin: 3.9 g/dL (ref 3.5–5.2)
Alkaline Phosphatase: 66 U/L (ref 39–117)
BUN: 9 mg/dL (ref 6–23)
CO2: 23 mEq/L (ref 19–32)
Calcium: 9.1 mg/dL (ref 8.4–10.5)
Chloride: 105 mEq/L (ref 96–112)
Creatinine, Ser: 0.4 mg/dL — ABNORMAL LOW (ref 0.50–1.10)
GFR calc Af Amer: >90 mL/min (ref >90)
GFR calc non Af Amer: >90 mL/min (ref >90)
Glucose, Bld: 100 mg/dL — ABNORMAL HIGH (ref 70–99)
Potassium: 3.7 mEq/L (ref 3.5–5.1)
Sodium: 138 mEq/L (ref 135–145)
Total Bilirubin: 0.2 mg/dL — ABNORMAL LOW (ref 0.3–1.2)
Total Protein: 7.5 g/dL (ref 6.0–8.3)

## 2011-06-15 LAB — CBC
HCT: 42 % (ref 36.0–46.0)
Hemoglobin: 14.2 g/dL (ref 12.0–15.0)
MCH: 29.8 pg (ref 26.0–34.0)
MCHC: 33.8 g/dL (ref 30.0–36.0)
MCV: 88.1 fL (ref 78.0–100.0)
Platelets: 216 10*3/uL (ref 150–400)
RBC: 4.77 MIL/uL (ref 3.87–5.11)
RDW: 13.4 % (ref 11.5–15.5)
WBC: 6.8 10*3/uL (ref 4.0–10.5)

## 2011-06-15 LAB — LIPASE, BLOOD: Lipase: 41 U/L (ref 11–59)

## 2011-06-15 MED ORDER — FAMOTIDINE 20 MG PO TABS: 20.0000 mg | ORAL_TABLET | Freq: Two times a day (BID) | ORAL | Status: DC

## 2011-06-15 NOTE — ED Notes (Signed)
Pt c/o generalized abd pain with hx of constipation, seen here several days ago for same, was unable to drink golytlye
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
## 2011-06-15 NOTE — ED Provider Notes (Signed)
Pt's abdominal ultasound showed no gallstones.  I advised her of this.  She plans to followup with Dr. Elenore Paddy, her GI doctor, next Thursday.  Carleene Cooper III, MD 06/15/11 [phone]

## 2011-06-15 NOTE — ED Notes (Signed)
Pt transported to ultrasound.

## 2011-06-17 IMAGING — CR DG CHEST 2V
2 series · 2 of 2 positions shown · non-contrast
Comparison: Chest radiograph performed 03/18/2010

CLINICAL DATA: Abdominal pain and chest pain; shortness of breath.
Pain between the shoulder blades.  History of smoking.

CHEST - 2 VIEW

[w chest pa]
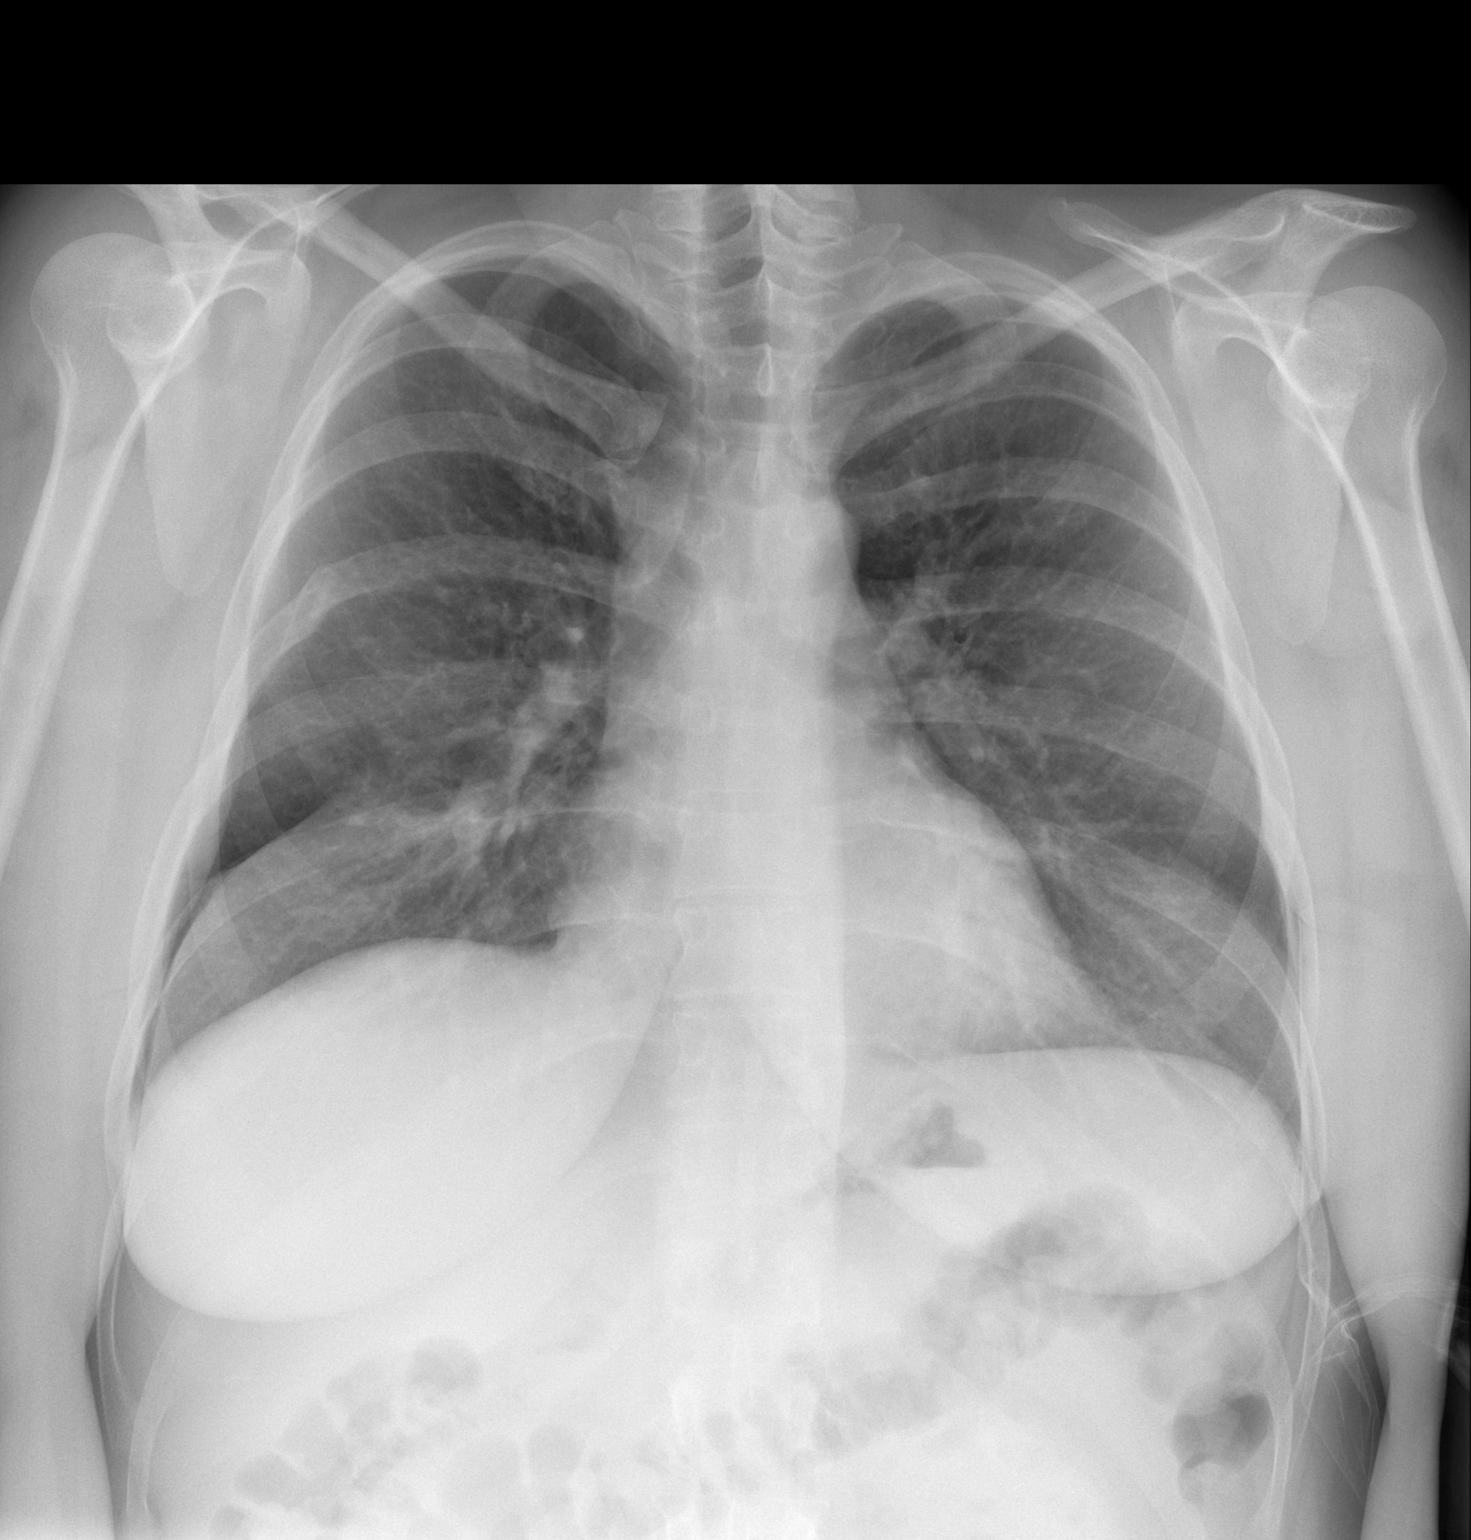

[w chest lat *]
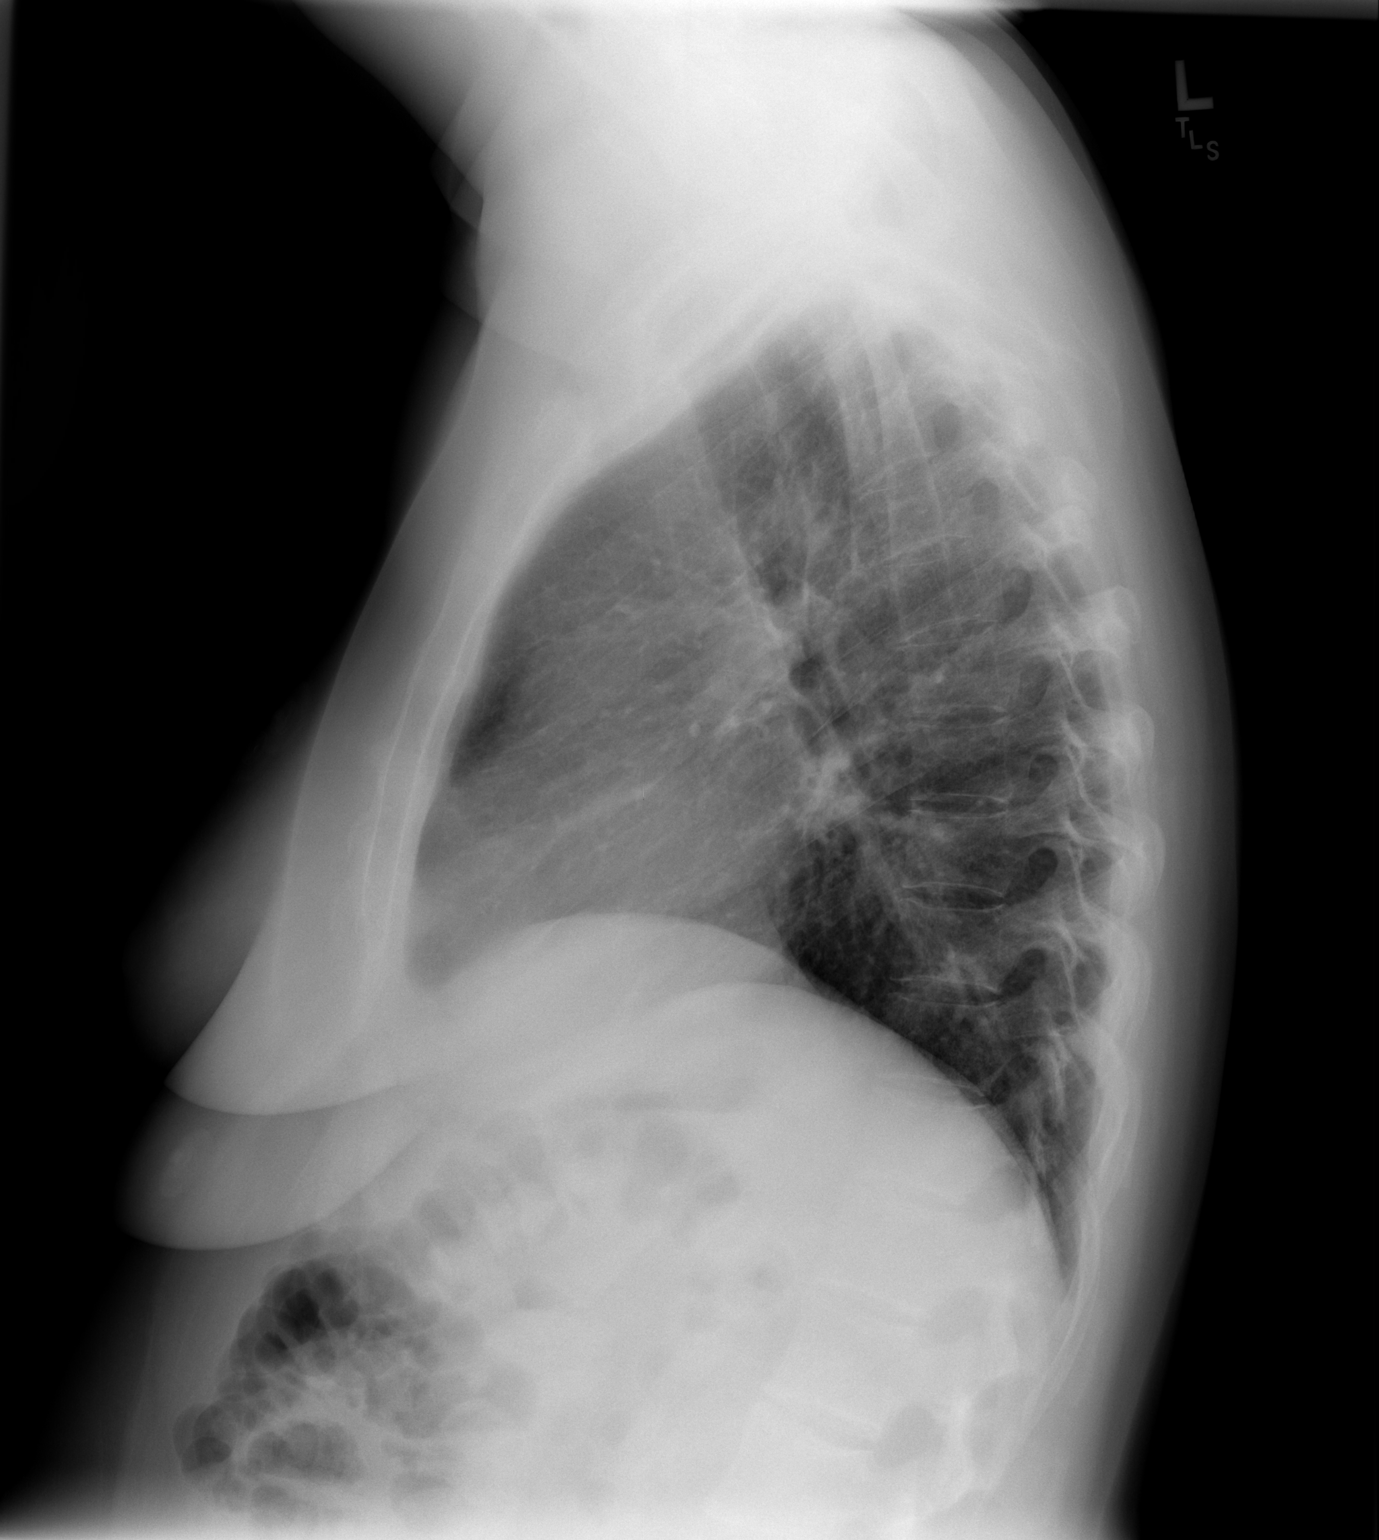

[2 of 2 positions shown; findings below may reference images not displayed]

FINDINGS: The lungs are well-aerated and clear.  There is no
evidence of focal opacification, pleural effusion or pneumothorax.

The heart is normal in size; the mediastinal contour is within
normal limits.  No acute osseous abnormalities are seen. A healed
fracture of the the right posterolateral 6th rib is noted.
IMPRESSION: No acute cardiopulmonary process seen.

## 2011-06-21 ENCOUNTER — Telehealth: Payer: Self-pay | Admitting: *Deleted

## 2011-06-21 LAB — HM MAMMOGRAPHY: HM Mammogram: NORMAL

## 2011-06-21 MED ORDER — LOSARTAN POTASSIUM 25 MG PO TABS: 25.0000 mg | ORAL_TABLET | Freq: Every day | ORAL | Status: DC

## 2011-06-21 NOTE — Telephone Encounter (Signed)
Amlodipine is a calcium channel blocker so its slows the heart down, it doesn't usually cause tachycardia. Will try changing to losaratan.

## 2011-06-21 NOTE — Telephone Encounter (Signed)
Pt given Norvasc for BP but this gives her tachycardia. Wonders if any other med she can take. BP staying up this am 140/89, 150/96 yesterday. Uses [facility] in [place] on [address] and [address]

## 2011-06-22 NOTE — Telephone Encounter (Signed)
Pt notified med changed and sent to pharmacy via VM. KJ LPN

## 2011-06-29 ENCOUNTER — Telehealth: Payer: Self-pay | Admitting: *Deleted

## 2011-07-03 ENCOUNTER — Other Ambulatory Visit (HOSPITAL_COMMUNITY): Payer: Medicare Other

## 2011-07-04 IMAGING — CR DG CHEST 2V
2 series · 2 of 2 positions shown · non-contrast
Comparison: 03/24/2010.

CLINICAL DATA: Wheezing.  Throat swelling.  Coughing.

CHEST - 2 VIEW

[w chest pa]
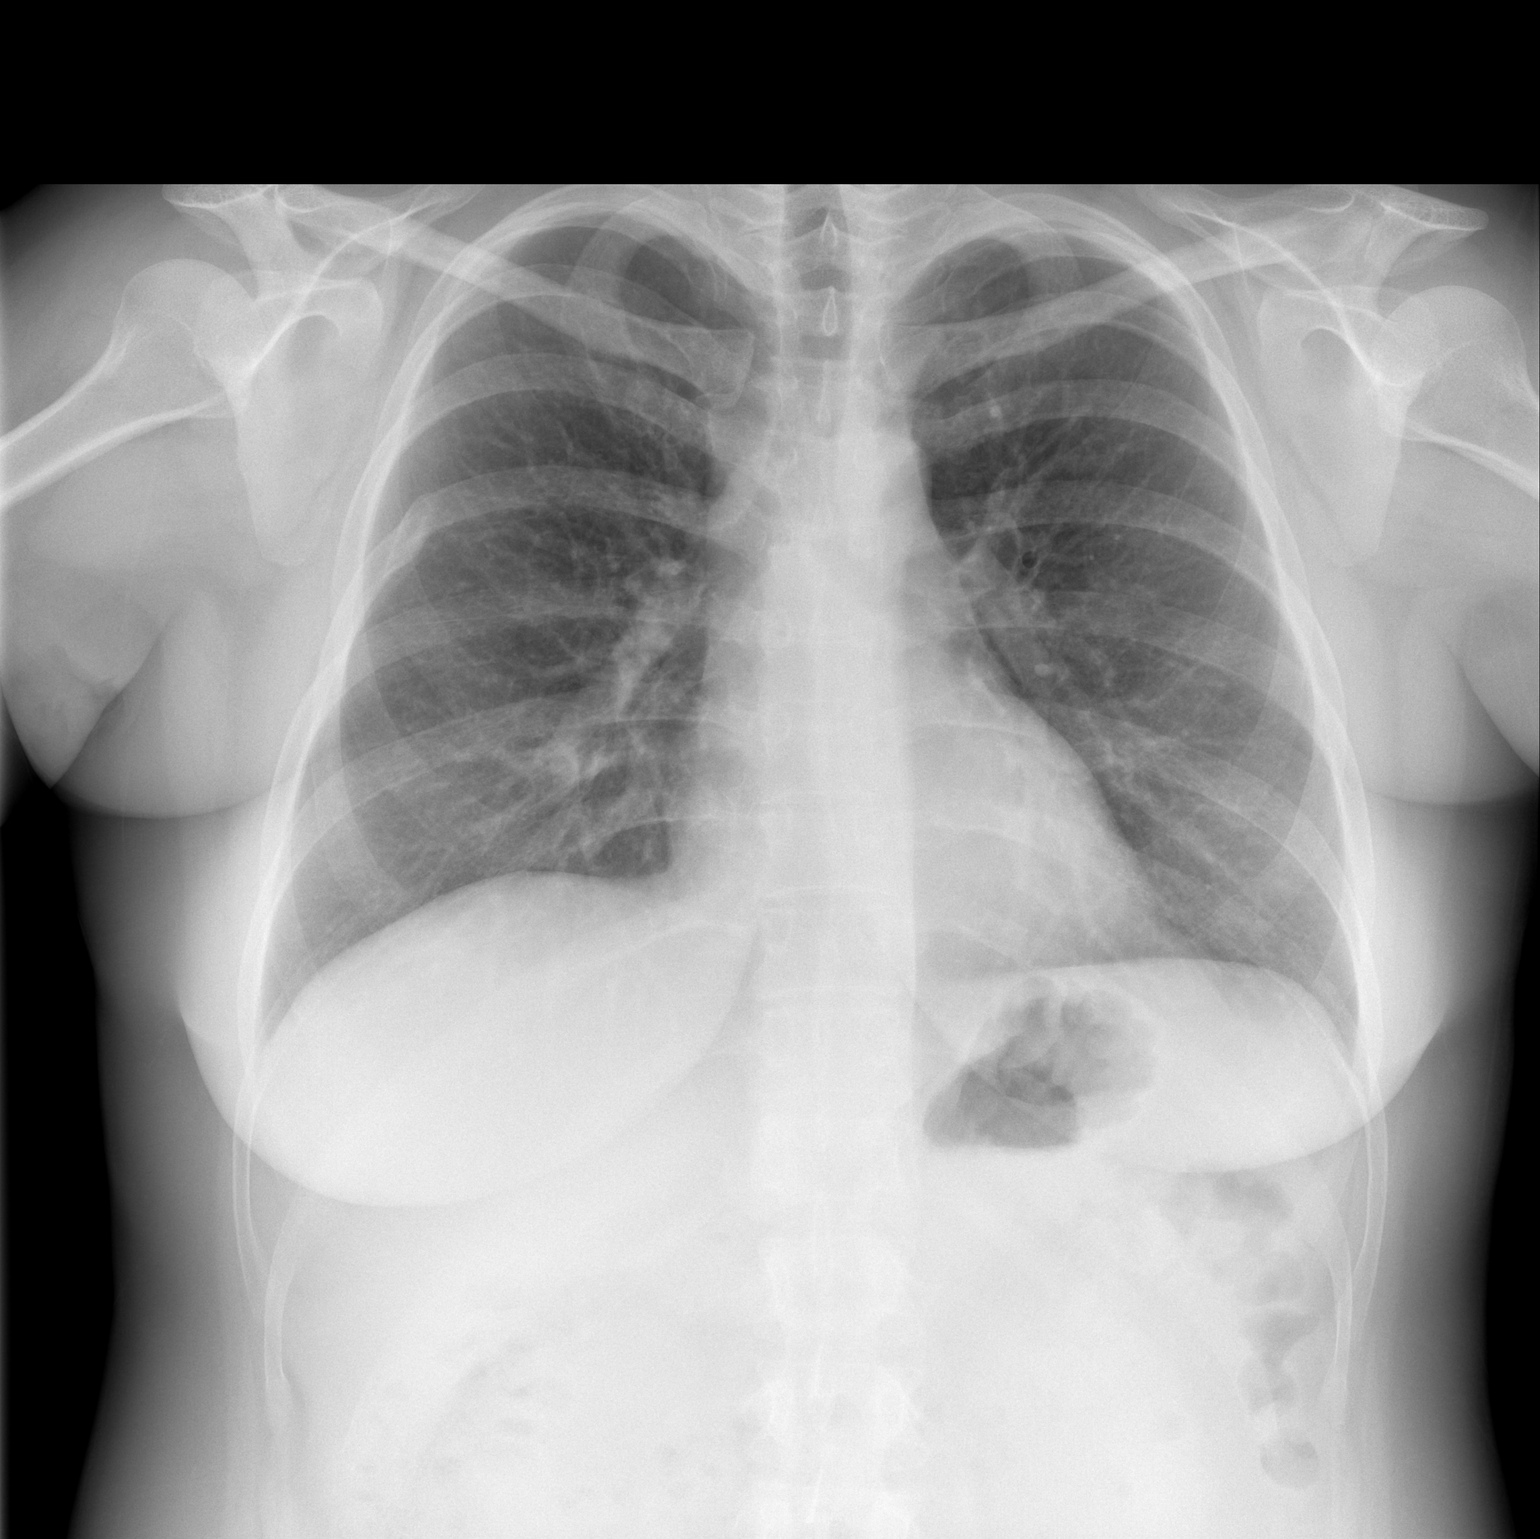

[w chest lat]
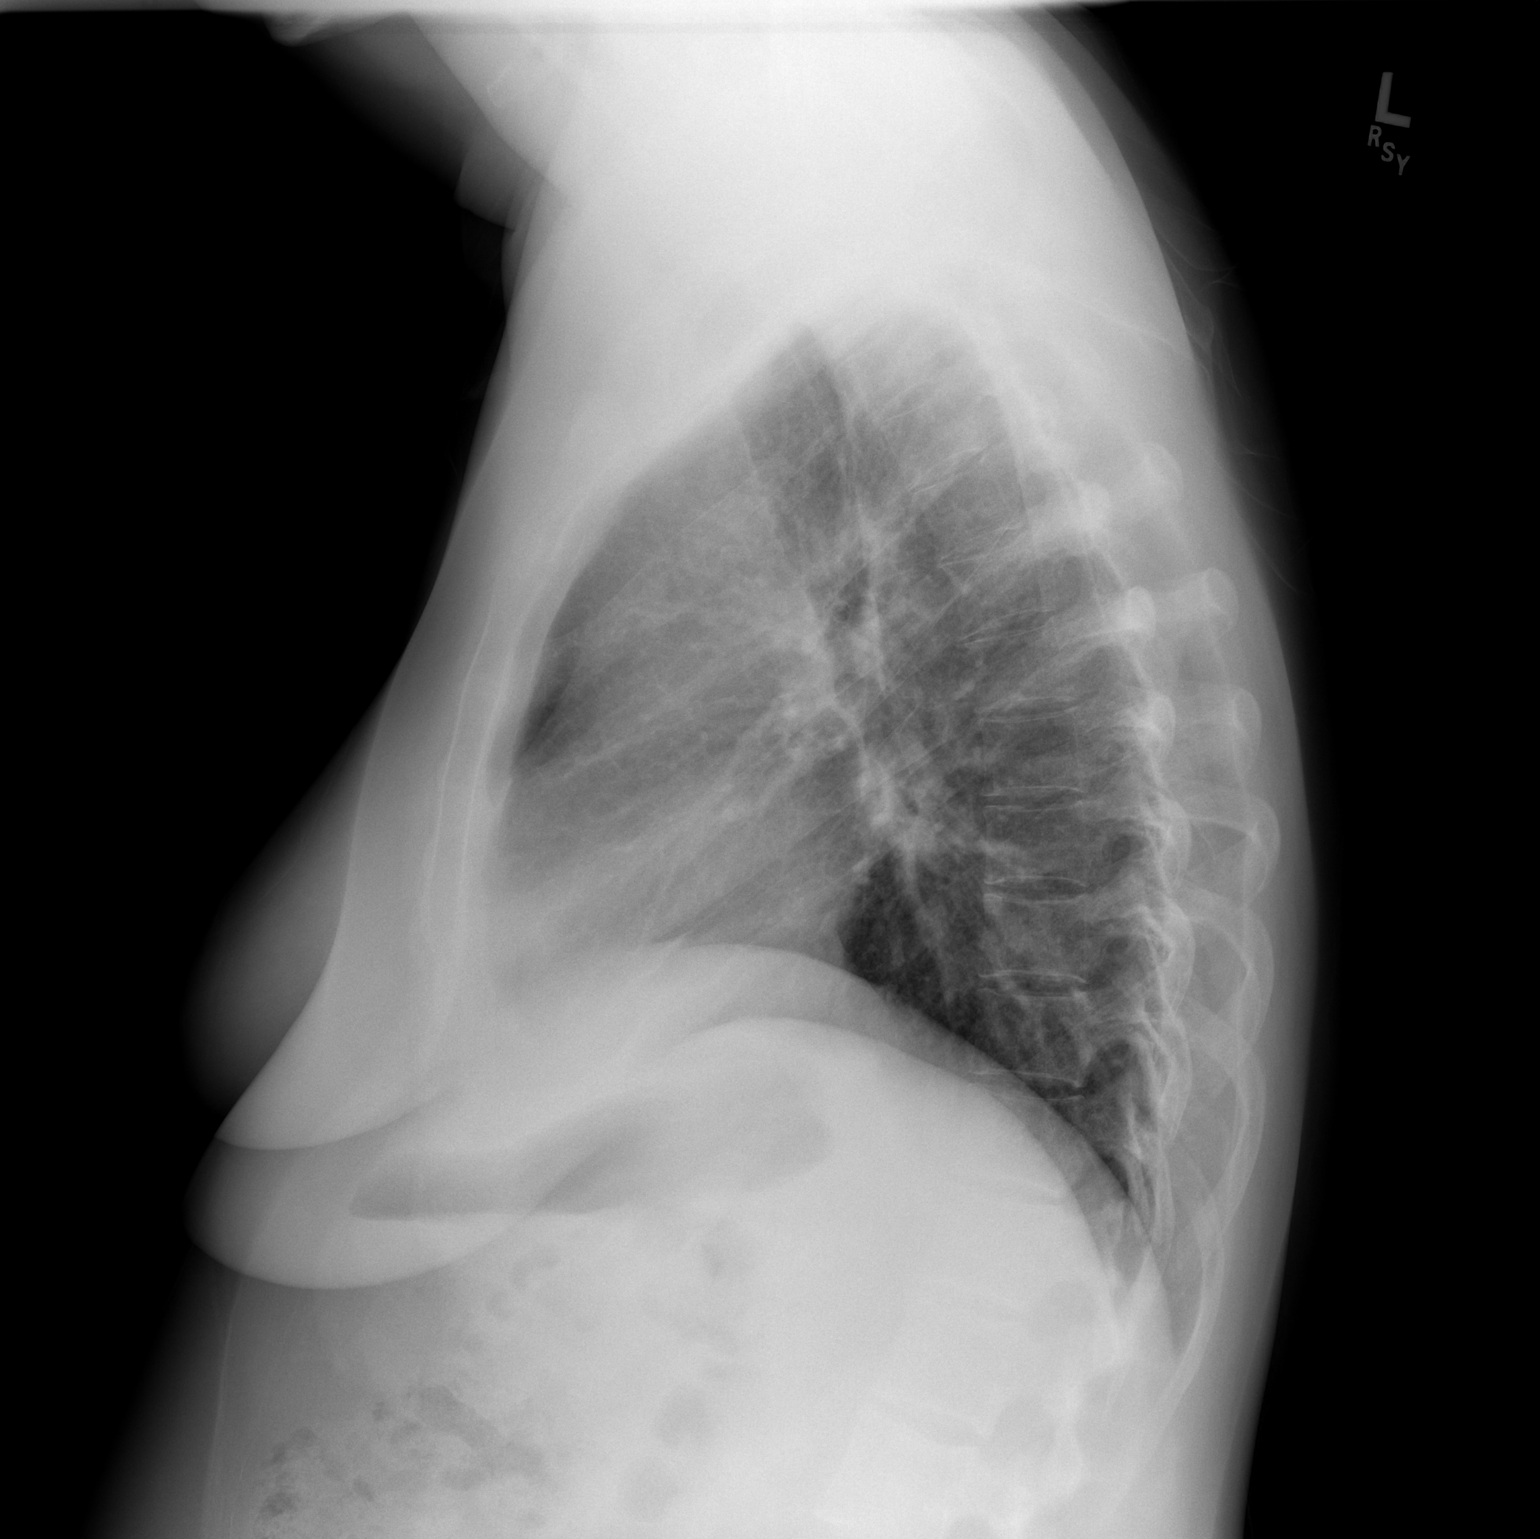

[2 of 2 positions shown; findings below may reference images not displayed]

FINDINGS: Normal cardiac and mediastinal silhouette.  Clear lung
fields.  Healed rib fractures.  No infiltrates or failure.  No
effusion or pneumothorax.  No change priors.
IMPRESSION: No active cardiopulmonary disease.

## 2011-07-11 ENCOUNTER — Emergency Department (INDEPENDENT_AMBULATORY_CARE_PROVIDER_SITE_OTHER): Payer: Medicare Other

## 2011-07-11 ENCOUNTER — Encounter (HOSPITAL_BASED_OUTPATIENT_CLINIC_OR_DEPARTMENT_OTHER): Payer: Self-pay | Admitting: *Deleted

## 2011-07-11 ENCOUNTER — Ambulatory Visit: Payer: Medicare Other | Admitting: Critical Care Medicine

## 2011-07-11 ENCOUNTER — Telehealth: Payer: Self-pay | Admitting: Family Medicine

## 2011-07-11 ENCOUNTER — Emergency Department (HOSPITAL_BASED_OUTPATIENT_CLINIC_OR_DEPARTMENT_OTHER)
Admission: EM | Admit: 2011-07-11 | Discharge: 2011-07-11 | Disposition: A | Discharge status: Home / Self Care | Payer: Medicare Other | Attending: Emergency Medicine | Admitting: Emergency Medicine

## 2011-07-11 DIAGNOSIS — R0789 Other chest pain: Secondary | ICD-10-CM

## 2011-07-11 DIAGNOSIS — G4730 Sleep apnea, unspecified: Secondary | ICD-10-CM | POA: Insufficient documentation

## 2011-07-11 DIAGNOSIS — R05 Cough: Secondary | ICD-10-CM

## 2011-07-11 DIAGNOSIS — R599 Enlarged lymph nodes, unspecified: Secondary | ICD-10-CM | POA: Insufficient documentation

## 2011-07-11 DIAGNOSIS — Z79899 Other long term (current) drug therapy: Secondary | ICD-10-CM | POA: Insufficient documentation

## 2011-07-11 DIAGNOSIS — J45909 Unspecified asthma, uncomplicated: Secondary | ICD-10-CM | POA: Insufficient documentation

## 2011-07-11 DIAGNOSIS — R591 Generalized enlarged lymph nodes: Secondary | ICD-10-CM

## 2011-07-11 DIAGNOSIS — E785 Hyperlipidemia, unspecified: Secondary | ICD-10-CM | POA: Insufficient documentation

## 2011-07-11 DIAGNOSIS — R059 Cough, unspecified: Secondary | ICD-10-CM

## 2011-07-11 DIAGNOSIS — R079 Chest pain, unspecified: Secondary | ICD-10-CM | POA: Insufficient documentation

## 2011-07-11 DIAGNOSIS — I10 Essential (primary) hypertension: Secondary | ICD-10-CM | POA: Insufficient documentation

## 2011-07-11 MED ORDER — CEPHALEXIN 500 MG PO CAPS: 500.0000 mg | ORAL_CAPSULE | Freq: Four times a day (QID) | ORAL | Status: DC

## 2011-07-11 NOTE — ED Notes (Signed)
Patient states she had been sick with cough and cold symptoms for several days.  States she woke up this morning with right arm swelling and right chest pain like it did when she had pneumonia.  Patient refused to have an EKG.  Patient states she made an appointment with Dr. Delford Field and decided to come in here due to the right arm swelling.
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
## 2011-07-11 NOTE — Telephone Encounter (Signed)
Patient called and left a message wanting to speak with Selena Batten about something. Please call her back. Thanks

## 2011-07-16 IMAGING — CR DG CHEST 2V
2 series · 2 of 2 positions shown · non-contrast
Comparison: 04/10/2010

CLINICAL DATA: Shortness of breath

CHEST - 2 VIEW

[w chest pa]
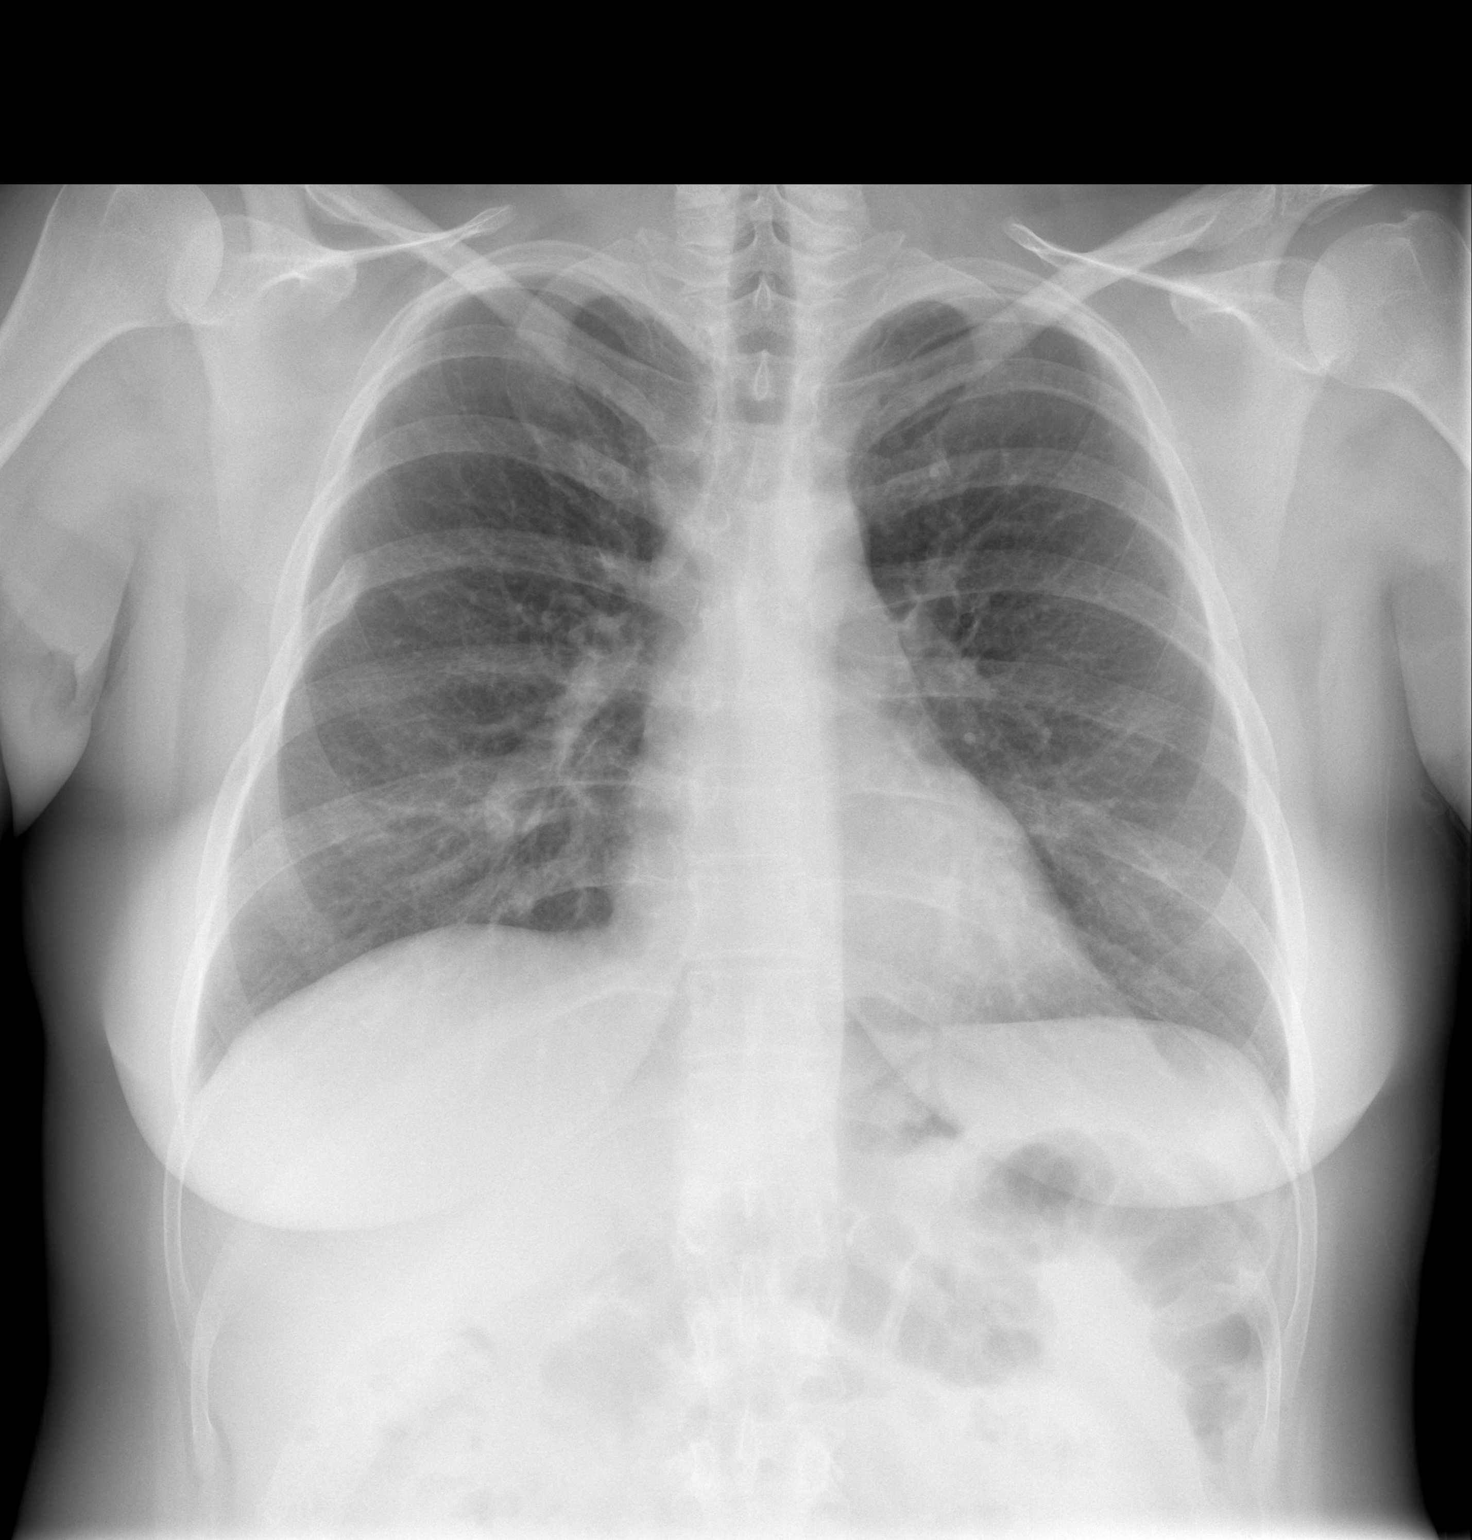

[w chest lat]
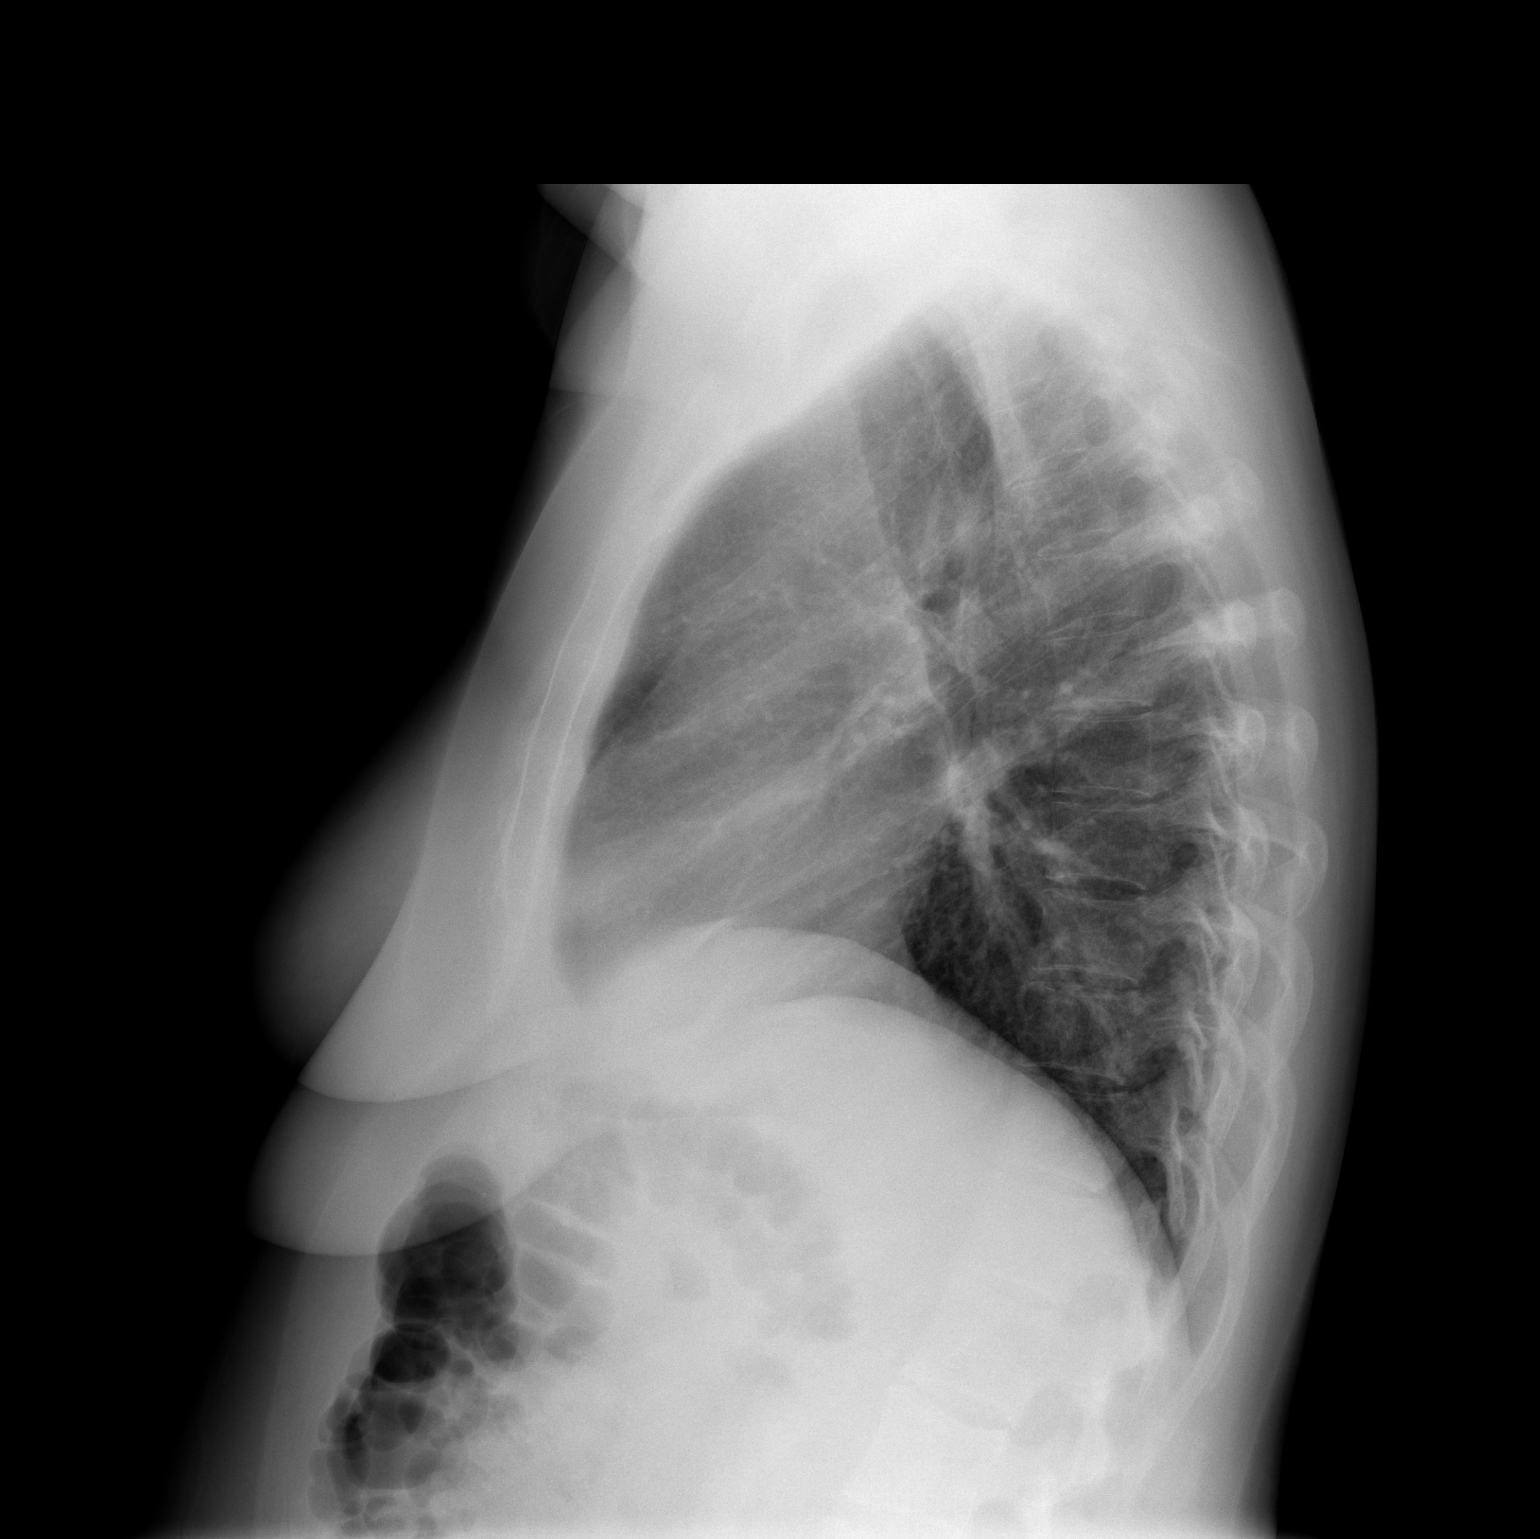

[2 of 2 positions shown; findings below may reference images not displayed]

FINDINGS: The lungs are clear.  No confluent airspace opacities are
seen.  The heart is normal in size.  A healed right sixth rib
fracture is seen.  The upper abdomen is normal.
IMPRESSION: No acute findings.

## 2011-07-16 IMAGING — CR DG NECK SOFT TISSUE
1 series · 1 of 1 positions shown · non-contrast
Comparison: None.

CLINICAL DATA: Difficulty breathing

NECK SOFT TISSUES - 1+ VIEW

[w soft tissue neck]
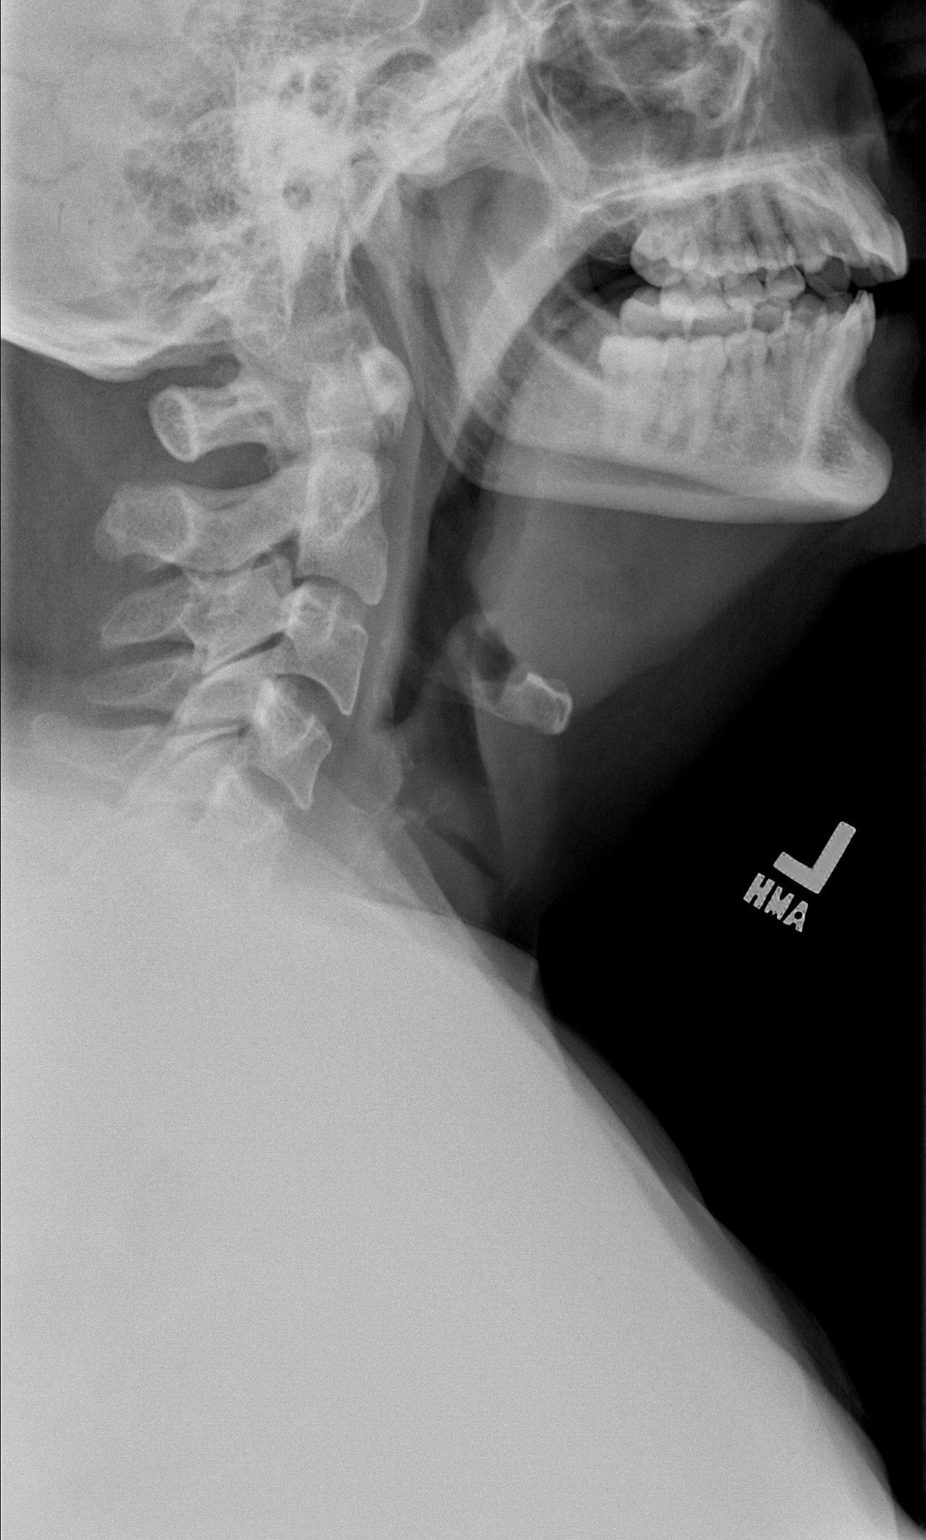

[1 of 1 positions shown; findings below may reference images not displayed]

FINDINGS: No radiopaque bodies are seen in the visualized airway.
The soft tissues are unremarkable.  Specifically, no prevertebral
soft tissue swelling is seen.  The bones are within normal limits.
IMPRESSION: No acute findings.

## 2011-07-17 ENCOUNTER — Encounter: Payer: Self-pay | Admitting: Family

## 2011-07-17 ENCOUNTER — Encounter (HOSPITAL_BASED_OUTPATIENT_CLINIC_OR_DEPARTMENT_OTHER): Payer: Self-pay

## 2011-07-17 ENCOUNTER — Ambulatory Visit (HOSPITAL_BASED_OUTPATIENT_CLINIC_OR_DEPARTMENT_OTHER)
Admission: RE | Admit: 2011-07-17 | Discharge: 2011-07-17 | Disposition: A | Discharge status: Home / Self Care | Payer: Medicare Other | Source: Ambulatory Visit | Attending: Family | Admitting: Family

## 2011-07-17 ENCOUNTER — Telehealth: Payer: Self-pay | Admitting: *Deleted

## 2011-07-17 ENCOUNTER — Other Ambulatory Visit: Payer: Self-pay | Admitting: Family

## 2011-07-17 ENCOUNTER — Ambulatory Visit (INDEPENDENT_AMBULATORY_CARE_PROVIDER_SITE_OTHER): Payer: Medicare Other | Admitting: Family

## 2011-07-17 ENCOUNTER — Encounter: Payer: Self-pay | Admitting: Neurology

## 2011-07-17 VITALS — BP 140/98 | HR 102 | Temp 98.1°F | Resp 16 | Ht 62.0 in | Wt 176.1 lb

## 2011-07-17 DIAGNOSIS — R51 Headache: Secondary | ICD-10-CM | POA: Insufficient documentation

## 2011-07-17 DIAGNOSIS — Z8669 Personal history of other diseases of the nervous system and sense organs: Secondary | ICD-10-CM

## 2011-07-17 DIAGNOSIS — Z0189 Encounter for other specified special examinations: Secondary | ICD-10-CM

## 2011-07-17 DIAGNOSIS — F411 Generalized anxiety disorder: Secondary | ICD-10-CM

## 2011-07-17 DIAGNOSIS — H532 Diplopia: Secondary | ICD-10-CM

## 2011-07-17 DIAGNOSIS — G43909 Migraine, unspecified, not intractable, without status migrainosus: Secondary | ICD-10-CM | POA: Insufficient documentation

## 2011-07-17 DIAGNOSIS — I10 Essential (primary) hypertension: Secondary | ICD-10-CM

## 2011-07-17 LAB — POCT URINE PREGNANCY: Preg Test, Ur: NEGATIVE

## 2011-07-17 MED ORDER — IOHEXOL 350 MG/ML SOLN
98.0000 mL | Freq: Once | INTRAVENOUS | Status: AC | PRN
  Administered 2011-07-17: 98 mL via INTRAVENOUS

## 2011-07-17 NOTE — Assessment & Plan Note (Signed)
Deteriorated.  She is instructed to start cozaar.  HTN may be contributing factor to her headache.

## 2011-07-17 NOTE — Assessment & Plan Note (Signed)
She reports that this is being managed by psychiatry.
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
## 2011-07-17 NOTE — Patient Instructions (Signed)
Please report CT/CTA of the brain on the first floor this AM. You will be contacted about your referral to neurology. Go to the ER if you develop weakness, slurred speech, or if symptoms worsen.

## 2011-07-17 NOTE — Telephone Encounter (Signed)
Results reviewed with pt

## 2011-07-17 NOTE — Assessment & Plan Note (Signed)
45 yr old female with symptoms consistent with migraine headache.  CTA of the brain is negative for infarct or aneurysm.  Reassurance provided to patient.  Will plan to refer to neurology for further management of her migraines and headache. She is instructed to go to the ED if she develops slurred speech, numbness, weakness or further visual issues.  She verbalizes understanding.  Recommended tylenol as needed for HA with Advil PRN severe pain- to be used sparingly.

## 2011-07-17 NOTE — Progress Notes (Signed)
Summary: SOB,SINUS PRESSURE/WB   Vital Signs:  Patient Profile:   45 Years Old Female CC:      Swollen throat with mild SOB and headache Height:     62 inches (157.48 cm) Weight:      175 pounds O2 Sat:      99 % O2 treatment:    Room Air Temp:     98.3 degrees F oral Pulse rate:   91 / minute Resp:     18 per minute BP sitting:   136 / 88  Pt. in pain?   yes    Location:   head  Vitals Entered By: Lavell Islam RN (November 18, 2010 9:55 AM)                   Prior Medications Reviewed Using: Patient Recall  Prior Medication List:  TYLENOL JR MELTAWAYS 160 MG TBDP (ACETAMINOPHEN) as needed XOPENEX HFA 45 MCG/ACT AERO (LEVALBUTEROL TARTRATE) Every 6 hours prn  Pls fill ,  this pt cannot tolerate albuterol due to tachycardia LIDODERM 5 % PTCH (LIDOCAINE) as needed * STRAIGHT CANE Dx: vertigo, sacroilitis ZOFRAN 4 MG TABS (ONDANSETRON HCL) Take 1 tablet by mouth two times a day as needed nausea SINUS RINSE BOTTLE KIT  PACK (HYPERTONIC NASAL WASH) 1-2 times daily PREVACID SOLUTAB 30 MG TBDP (LANSOPRAZOLE) 1-2 daily XOPENEX 0.63 MG/3ML NEBU (LEVALBUTEROL HCL) Use in nebulizer three times a day as needed DX severe asthma 493.01 cannot take albuterol NASONEX 50 MCG/ACT  SUSP (MOMETASONE FUROATE) Two puffs each nostril daily   Current Allergies (reviewed today): ! * IRON IV/PO ! * SINGULAIR ! CIPRO ! PCN ! ERYTHROMYCIN ! SULFA ! ROCEPHIN ! FLAGYL ! * BYSTOLIC ! BETA BLOCKERS ! * ANTIEMETICS ! REGLAN ! * ANTIINFLAMMATORIES ! TRIFLUOPERAZINE HCL (TRIFLUOPERAZINE HCL) ! BUTORPHANOL TARTRATE (BUTORPHANOL TARTRATE) ! LISINOPRIL ! CLONIDINE HCL (CLONIDINE HCL) ! * ORAL STEROIDS ! MACROBID (NITROFURANTOIN MONOHYD MACRO) ZOLOFT EFFEXOR PROZAC PAXIL ADHESIVE TAPEHistory of Present Illness History from: patient Chief Complaint: Swollen throat with mild SOB and headache History of Present Illness: 45 Years Old Female complains of onset of cold symptoms for a few weeks.   Danahi has been using Levaquin which is helping a little bit.  She is having some type of surgery next week for Paget's disease.   She is having active workups for other medical issues.  She is seeing multiple doctors as well. She has seen GI in the past for an esophageal dilation.  She is concerned that because her nose is so stuffed up, her throat is next. + sore throat +/- cough No pleuritic pain No wheezing ++ nasal congestion + post-nasal drainage + sinus pain/pressure No chest congestion No itchy/red eyes + earache + nosebleed No hemoptysis + SOB No chills/sweats No fever No nausea No vomiting No abdominal pain No diarrhea No skin rashes No fatigue No myalgias No headache   REVIEW OF SYSTEMS Constitutional Symptoms       Complains of fever, chills, and night sweats.     Denies weight loss, weight gain, and fatigue.  Eyes       Denies change in vision, eye pain, eye discharge, glasses, contact lenses, and eye surgery. Ear/Nose/Throat/Mouth       Complains of change in hearing, ear pain, frequent nose bleeds, and sinus problems.      Denies hearing loss/aids, ear discharge, dizziness, sore throat, hoarseness, and tooth pain or bleeding.  Respiratory       Complains of  dry cough, wheezing, and shortness of breath.      Denies productive cough, asthma, bronchitis, and emphysema/COPD.      Comments: pneumonia 1/12 Cardiovascular       Denies murmurs, chest pain, and tires easily with exhertion.    Gastrointestinal       Denies stomach pain, nausea/vomiting, diarrhea, constipation, blood in bowel movements, and indigestion. Genitourniary       Denies painful urination, kidney stones, and loss of urinary control. Neurological       Complains of headaches.      Denies paralysis, seizures, and fainting/blackouts. Musculoskeletal       Denies muscle pain, joint pain, joint stiffness, decreased range of motion, redness, swelling, muscle weakness, and gout.      Comments:  recent toe nail removal x 5 Skin       Denies bruising, unusual mles/lumps or sores, and hair/skin or nail changes.  Psych       Denies mood changes, temper/anger issues, anxiety/stress, speech problems, depression, and sleep problems. Other Comments: swollen throat/neck glands with frequent cough and night sweats   Family History: Reviewed history from 09/14/2008 and no changes required. MI, depression, DM, cholesterol, HTN  father had emphysema, and skin and lung cancer sister had allergies. amd cardiac stentsin her 50s/.  2 sisters with asthma 4 siblings with heart disease Family History of Alcoholism/Addiction Family History of Arthritis Family History Breast cancer Family History Emotional/Mental Illness in Parents / Grandparents / extended family  Social History: Reviewed history from 04/21/2010 and no changes required. 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 Smoker - 1-2 ppd x 15 yrs quit in 2001 Alcohol use-no Drug use-no Regular exercise-no lives with spouse and son (only child) Physical Exam General appearance: well developed, well nourished, no acute distress Ears: normal, no lesions or deformities Nasal: clear discharge and congestion Oral/Pharynx: tongue normal, posterior pharynx without erythema or exudate Neck: global tenderness, no true LAD felt Chest/Lungs: no rales, wheezes, or rhonchi bilateral, breath sounds equal without effort Heart: regular rate and  rhythm, no murmur MSE: oriented to time, place, and person, talkative, flight of ideas Assessment New Problems: DYSPNEA (ICD-786.05)   Plan New Medications/Changes: AVELOX 400 MG TABS (MOXIFLOXACIN HCL) 1 daily for 5 days  #5 x 0, 11/18/2010, Hoyt Koch MD NASONEX 50 MCG/ACT SUSP (MOMETASONE FUROATE) 1-2 sprays each nostril daily  #1 x 0, 11/18/2010, Hoyt Koch MD  New Orders: Est. Patient Level V [40981] Rapid Strep [19147] Flu A+B [87400] T-DG Neck Soft Tissue  [70360] Planning Comments:   Rapid strep and flu are negative.  Neck Xray read as normal by radiology. I really have no idea why she is feeling this way.  LIkely viral, allergic, basic congestion.  Looking at the records, the patient seems to have a mental health issue as well that I feel is causing a lot of her symptoms, worrying, anxiety, etc.  She also seems quite hesitant to have her surgery next week may be looking for an excuse not to.  She has seen multiple specialists (ENT, GI, pulmonology, anethesia), and I've told her that if they don't know what's wrong, then I certainly don't have anything to add from a FP point of view.  She is allergic to almost every medication I suggest so I don't know what to do.  She needs to follow up with her ENT or find a new one.  She may need to be scoped.  Advise Claritin or other antihistamine.  Otherwise, patient  is "allergic" to steroids and decongestants and most antibiotics and I really don't know how to treat her properly. If worsening, must go to the ER.  She should call her surgeon on Monday to express her concerns.  It iis theif call to do/ not do the surgery with her current symptoms.   The patient and/or caregiver has been counseled thoroughly with regard to medications prescribed including dosage, schedule, interactions, rationale for use, and possible side effects and they verbalize understanding.  Diagnoses and expected course of recovery discussed and will return if not improved as expected or if the condition worsens. Patient and/or caregiver verbalized understanding.  Prescriptions: AVELOX 400 MG TABS (MOXIFLOXACIN HCL) 1 daily for 5 days  #5 x 0   Entered and Authorized by:   Hoyt Koch MD   Signed by:   Hoyt Koch MD on 11/18/2010   Method used:   Print then Give to Patient   RxID:   [id number] NASONEX 50 MCG/ACT SUSP (MOMETASONE FUROATE) 1-2 sprays each nostril daily  #1 x 0   Entered and Authorized by:   Hoyt Koch  MD   Signed by:   Hoyt Koch MD on 11/18/2010   Method used:   Print then Give to Patient   RxID:   [id number]   Orders Added: 1)  Est. Patient Level V [13244] 2)  Rapid Strep [01027] 3)  Flu A+B [87400] 4)  T-DG Neck Soft Tissue [70360]    Laboratory Results  Date/Time Received: November 18, 2010 11:04 AM  Date/Time Reported: November 18, 2010 11:05 AM   Other Tests  Rapid Strep: negative Influenza A: negative Influenza B: negative

## 2011-07-17 NOTE — Telephone Encounter (Signed)
Pt called requesting CTA results. States she has a copy of the disc and report to take to neurology and has concerns about the calcifications that were seen. Also concerned about the statement that a small infarct could not be ruled out.  Please advise.

## 2011-07-18 ENCOUNTER — Telehealth: Payer: Self-pay | Admitting: Family

## 2011-07-18 MED ORDER — SUMATRIPTAN SUCCINATE 50 MG PO TABS: ORAL_TABLET | ORAL | Status: DC

## 2011-07-18 NOTE — Telephone Encounter (Signed)
Patient informed and reminded of the appt date and time

## 2011-07-18 NOTE — Telephone Encounter (Signed)
Call from patient seen yesterday for headache,   Headache worse last night  Didn't sleep well last night, patient's appt with Neurology is not until January 17  , please call pt

## 2011-07-18 NOTE — Telephone Encounter (Signed)
Pt states she took Advil and Tylenol last night. Pt states she couldn't sleep till 3:30 this morning. Pt is wandering if she should come in today instead of tomorrow? Please advise?

## 2011-07-18 NOTE — Telephone Encounter (Signed)
I would recommend trial of imitrex.  If this helps, ok to come in tomorrow.

## 2011-07-19 ENCOUNTER — Encounter: Payer: Self-pay | Admitting: Family

## 2011-07-19 ENCOUNTER — Ambulatory Visit (INDEPENDENT_AMBULATORY_CARE_PROVIDER_SITE_OTHER): Payer: Medicare Other | Admitting: Family

## 2011-07-19 DIAGNOSIS — C4499 Other specified malignant neoplasm of skin, unspecified: Secondary | ICD-10-CM

## 2011-07-19 DIAGNOSIS — R51 Headache: Secondary | ICD-10-CM

## 2011-07-19 DIAGNOSIS — C519 Malignant neoplasm of vulva, unspecified: Secondary | ICD-10-CM

## 2011-07-19 DIAGNOSIS — Z0000 Encounter for general adult medical examination without abnormal findings: Secondary | ICD-10-CM | POA: Insufficient documentation

## 2011-07-19 DIAGNOSIS — D649 Anemia, unspecified: Secondary | ICD-10-CM

## 2011-07-19 DIAGNOSIS — G43909 Migraine, unspecified, not intractable, without status migrainosus: Secondary | ICD-10-CM

## 2011-07-19 NOTE — Assessment & Plan Note (Signed)
This is being followed by hematology  She tells me that she is receiving IV iron every few months.

## 2011-07-19 NOTE — Patient Instructions (Signed)
Please work hard on diet, exercise and weight loss.  Return fasting for blood work to the lab later this week.   Follow up in 3 months.

## 2011-07-19 NOTE — Assessment & Plan Note (Signed)
GYN: Jennifer [name]  [facility] S/p biopsy

## 2011-07-19 NOTE — Assessment & Plan Note (Signed)
Pt to start PRN imitrex and has been referred to neurology.
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
## 2011-07-19 NOTE — Assessment & Plan Note (Signed)
Pt was counseled on diet, exercise and weight loss.  She declines flu shot.  Reports last tetanus <10 yrs ago.

## 2011-07-24 ENCOUNTER — Telehealth: Payer: Self-pay | Admitting: Family

## 2011-07-24 ENCOUNTER — Ambulatory Visit (INDEPENDENT_AMBULATORY_CARE_PROVIDER_SITE_OTHER): Payer: Medicare Other | Admitting: Family

## 2011-07-24 ENCOUNTER — Encounter: Payer: Self-pay | Admitting: Family

## 2011-07-24 VITALS — BP 136/80 | HR 84 | Temp 98.2°F | Resp 16

## 2011-07-24 DIAGNOSIS — G8929 Other chronic pain: Secondary | ICD-10-CM

## 2011-07-24 DIAGNOSIS — M25519 Pain in unspecified shoulder: Secondary | ICD-10-CM

## 2011-07-24 DIAGNOSIS — Z23 Encounter for immunization: Secondary | ICD-10-CM

## 2011-07-24 DIAGNOSIS — M542 Cervicalgia: Secondary | ICD-10-CM

## 2011-07-24 NOTE — Telephone Encounter (Signed)
Darl Pikes from outpt rehab called stating that the referral we have for pt is expired. Start date was in July. They can only accept referrals within past 30 days. We need new referral.

## 2011-07-24 NOTE — Patient Instructions (Signed)
You will be contacted about your referral to the physical Therapist.  Follow up in 3 months. Keep your upcoming appointment with Dr. Modesto Charon.

## 2011-07-24 NOTE — Telephone Encounter (Signed)
Per verbal from [facility], ok to have pt complete a lipid panel (272.4). Pt is not fasting and states she wants to have lab done somewhere else but doesn't know the facility. Pt will contact us when she is ready to proceed.
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
## 2011-07-24 NOTE — Assessment & Plan Note (Signed)
Left shoulder and chronic neck pain, will send new referral to PT.

## 2011-07-26 ENCOUNTER — Telehealth: Payer: Self-pay | Admitting: Neurology

## 2011-07-26 ENCOUNTER — Telehealth: Payer: Self-pay | Admitting: Family

## 2011-07-26 ENCOUNTER — Ambulatory Visit: Payer: Medicare Other | Admitting: Physical Therapy

## 2011-07-26 MED ORDER — HYDROCODONE-ACETAMINOPHEN 5-500 MG PO CAPS: 1.0000 | ORAL_CAPSULE | Freq: Four times a day (QID) | ORAL | Status: DC | PRN

## 2011-07-26 MED ORDER — TOPIRAMATE 25 MG PO TABS: ORAL_TABLET | ORAL | Status: DC

## 2011-07-26 NOTE — Telephone Encounter (Signed)
Pt has an appointment scheduled for 08/30/2011 for headaches w/ a hx of seizure. She had another ED visit in [facility] this past week and wants to know if we can work her in ASAP. Is any lunchtime slot okay?

## 2011-07-26 NOTE — Telephone Encounter (Signed)
Pt states she has been in bed all day. States headache has been severe. Feels [facility] ER did not help her. Reports that imitrex makes her chest hurt and she does not take it. Reports that she is unable to take Aleve or ASA due to flushing. Pt states midrin helped in the past but is no longer covered by her insurance. Please advise if there are any other alternatives that pt can try? Pt states that Dr Modesto Charon was able to move her consultation up to 08/04/11.

## 2011-07-26 NOTE — Telephone Encounter (Signed)
Pls let pt know that I have called in a 1 time rx for vicodan for her headache.  I have also added topamax to be taken at bedtime to help prevent headaches. If headache worsens or no improvement, she should go to the ED. She should keep her upcoming apt with Dr. Modesto Charon.

## 2011-07-26 NOTE — Telephone Encounter (Signed)
Patient states that her headaches were so severe yesterday that she went to [facility] ED. The physician there told her that nothing was wrong with her and that upset the patient. Patient states that she is still hurting so bad that she wants to either make an appt or go to [facility] ED. I offered patient appt here to see Melissa and I told her that if she felt that she needed to go to the ED, then she should. Patient declined both. She wants to ask Melissa what she should do? She also wants to know if we can get her in sooner with Dr. Modesto Charon.

## 2011-07-26 NOTE — Telephone Encounter (Signed)
Notified pt and she states she will not take Vicodin. Reports she does not tolerate narcotics well but she will try Topamax. Cancelled Vicodin Rx with Renae Fickle at [facility].

## 2011-07-26 NOTE — Telephone Encounter (Signed)
Spoke to Dr Nash Dimmer office and they have placed pt on the cancellation list and have are checking with him to see if she can be worked in any sooner.

## 2011-07-26 NOTE — Telephone Encounter (Signed)
Pls call pt and let her know that she is on the cancellation/work in list with Dr. Modesto Charon.  I recommend that she try aleve and imitrex.  If no improvement or if her symptoms worsen she should be seen in the office.  If severe she should go to the ED.

## 2011-08-01 ENCOUNTER — Encounter (HOSPITAL_BASED_OUTPATIENT_CLINIC_OR_DEPARTMENT_OTHER): Payer: Self-pay | Admitting: Emergency Medicine

## 2011-08-01 ENCOUNTER — Emergency Department (HOSPITAL_BASED_OUTPATIENT_CLINIC_OR_DEPARTMENT_OTHER)
Admission: EM | Admit: 2011-08-01 | Discharge: 2011-08-01 | Disposition: A | Discharge status: Home / Self Care | Payer: Medicare Other | Attending: Emergency Medicine | Admitting: Emergency Medicine

## 2011-08-01 DIAGNOSIS — Z79899 Other long term (current) drug therapy: Secondary | ICD-10-CM | POA: Insufficient documentation

## 2011-08-01 DIAGNOSIS — J329 Chronic sinusitis, unspecified: Secondary | ICD-10-CM | POA: Insufficient documentation

## 2011-08-01 DIAGNOSIS — R509 Fever, unspecified: Secondary | ICD-10-CM | POA: Insufficient documentation

## 2011-08-01 DIAGNOSIS — E785 Hyperlipidemia, unspecified: Secondary | ICD-10-CM | POA: Insufficient documentation

## 2011-08-01 DIAGNOSIS — J45909 Unspecified asthma, uncomplicated: Secondary | ICD-10-CM | POA: Insufficient documentation

## 2011-08-01 DIAGNOSIS — G4730 Sleep apnea, unspecified: Secondary | ICD-10-CM | POA: Insufficient documentation

## 2011-08-01 MED ORDER — CEPHALEXIN 500 MG PO CAPS: 500.0000 mg | ORAL_CAPSULE | Freq: Three times a day (TID) | ORAL | Status: DC
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
## 2011-08-01 NOTE — ED Notes (Signed)
Pt has sinus complaints, nasal congestion, fever, scratchy sore throat, hot flashes x 1 week.  This mucous drainage at back of throat.

## 2011-08-02 ENCOUNTER — Ambulatory Visit: Payer: Medicare Other | Admitting: Physical Therapy

## 2011-08-04 ENCOUNTER — Ambulatory Visit (INDEPENDENT_AMBULATORY_CARE_PROVIDER_SITE_OTHER): Payer: Medicare Other | Admitting: Neurology

## 2011-08-04 ENCOUNTER — Encounter: Payer: Self-pay | Admitting: Neurology

## 2011-08-04 VITALS — BP 128/84 | HR 96 | Wt 178.0 lb

## 2011-08-04 DIAGNOSIS — H532 Diplopia: Secondary | ICD-10-CM
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
## 2011-08-09 ENCOUNTER — Ambulatory Visit (INDEPENDENT_AMBULATORY_CARE_PROVIDER_SITE_OTHER): Payer: Medicare Other | Admitting: Internal Medicine

## 2011-08-09 ENCOUNTER — Telehealth: Payer: Self-pay | Admitting: Family

## 2011-08-09 ENCOUNTER — Encounter: Payer: Self-pay | Admitting: Internal Medicine

## 2011-08-09 VITALS — BP 142/90 | HR 89 | Temp 98.1°F | Resp 18 | Wt 179.0 lb

## 2011-08-09 DIAGNOSIS — E785 Hyperlipidemia, unspecified: Secondary | ICD-10-CM

## 2011-08-09 DIAGNOSIS — R109 Unspecified abdominal pain: Secondary | ICD-10-CM

## 2011-08-09 LAB — CBC WITH DIFFERENTIAL/PLATELET
Basophils Absolute: 0 10*3/uL (ref 0.0–0.1)
Basophils Relative: 1 % (ref 0–1)
Eosinophils Absolute: 0.2 10*3/uL (ref 0.0–0.7)
Eosinophils Relative: 4 % (ref 0–5)
HCT: 40.8 % (ref 36.0–46.0)
Hemoglobin: 13.9 g/dL (ref 12.0–15.0)
Lymphocytes Relative: 31 % (ref 12–46)
Lymphs Abs: 1.2 10*3/uL (ref 0.7–4.0)
MCH: 30.2 pg (ref 26.0–34.0)
MCHC: 34.1 g/dL (ref 30.0–36.0)
MCV: 88.7 fL (ref 78.0–100.0)
Monocytes Absolute: 0.3 10*3/uL (ref 0.1–1.0)
Monocytes Relative: 9 % (ref 3–12)
Neutro Abs: 2.2 10*3/uL (ref 1.7–7.7)
Neutrophils Relative %: 56 % (ref 43–77)
Platelets: 209 10*3/uL (ref 150–400)
RBC: 4.6 MIL/uL (ref 3.87–5.11)
RDW: 12.9 % (ref 11.5–15.5)
WBC: 4 10*3/uL (ref 4.0–10.5)

## 2011-08-09 LAB — BASIC METABOLIC PANEL
BUN: 11 mg/dL (ref 6–23)
CO2: 25 mEq/L (ref 19–32)
Calcium: 8.8 mg/dL (ref 8.4–10.5)
Chloride: 105 mEq/L (ref 96–112)
Creat: 0.55 mg/dL (ref 0.50–1.10)
Glucose, Bld: 96 mg/dL (ref 70–99)
Potassium: 4 mEq/L (ref 3.5–5.3)
Sodium: 139 mEq/L (ref 135–145)

## 2011-08-09 LAB — AMYLASE: Amylase: 45 U/L (ref 0–105)

## 2011-08-09 LAB — HEPATIC FUNCTION PANEL
ALT: 15 U/L (ref 0–35)
AST: 11 U/L (ref 0–37)
Albumin: 4.1 g/dL (ref 3.5–5.2)
Alkaline Phosphatase: 54 U/L (ref 39–117)
Bilirubin, Direct: 0.1 mg/dL (ref 0.0–0.3)
Indirect Bilirubin: 0.3 mg/dL (ref 0.0–0.9)
Total Bilirubin: 0.4 mg/dL (ref 0.3–1.2)
Total Protein: 6.9 g/dL (ref 6.0–8.3)

## 2011-08-09 MED ORDER — ONDANSETRON HCL 4 MG PO TABS: 4.0000 mg | ORAL_TABLET | Freq: Four times a day (QID) | ORAL | Status: AC

## 2011-08-09 MED ORDER — SUCRALFATE 1 GM/10ML PO SUSP: 1.0000 g | Freq: Four times a day (QID) | ORAL | Status: DC

## 2011-08-09 NOTE — Telephone Encounter (Signed)
Call placed to patient at  [phone], she was informed labs were normal.
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
## 2011-08-09 NOTE — Telephone Encounter (Signed)
She says her stomach is really hurting would like to know if labs showed anything and what she should do

## 2011-08-11 ENCOUNTER — Telehealth: Payer: Self-pay | Admitting: *Deleted

## 2011-08-11 MED ORDER — OSELTAMIVIR PHOSPHATE 75 MG PO CAPS: 75.0000 mg | ORAL_CAPSULE | Freq: Two times a day (BID) | ORAL | Status: DC

## 2011-08-11 NOTE — Telephone Encounter (Signed)
If won't be seen and has sx and direct flu exposure can try tamiflu 75mg  bid x 5d if not allergic and no interactions

## 2011-08-11 NOTE — Telephone Encounter (Signed)
Pt called stating that her son and granddaughter are being treated for the flu. Pt states she has a cough, nausea, congestion in her throat, hoarseness and swelling in her neck "lymphnodes" x 3 days. Feels fatigued. Pt has appt with GI this afternoon. Declines to be seen at [facility] as she does not have a driver and it is too far for her to drive. Pt received her flu shot earlier in December. Please advise.

## 2011-08-11 NOTE — Telephone Encounter (Signed)
Rx sent to [facility], pt notified.

## 2011-08-13 IMAGING — CR DG CHEST 2V
2 series · 2 of 2 positions shown · non-contrast
Comparison: Chest x-ray of 04/22/2010

CLINICAL DATA: Right-sided chest pain, history of asthma and sleep
apnea, shortness of breath

CHEST - 2 VIEW

[w chest pa]
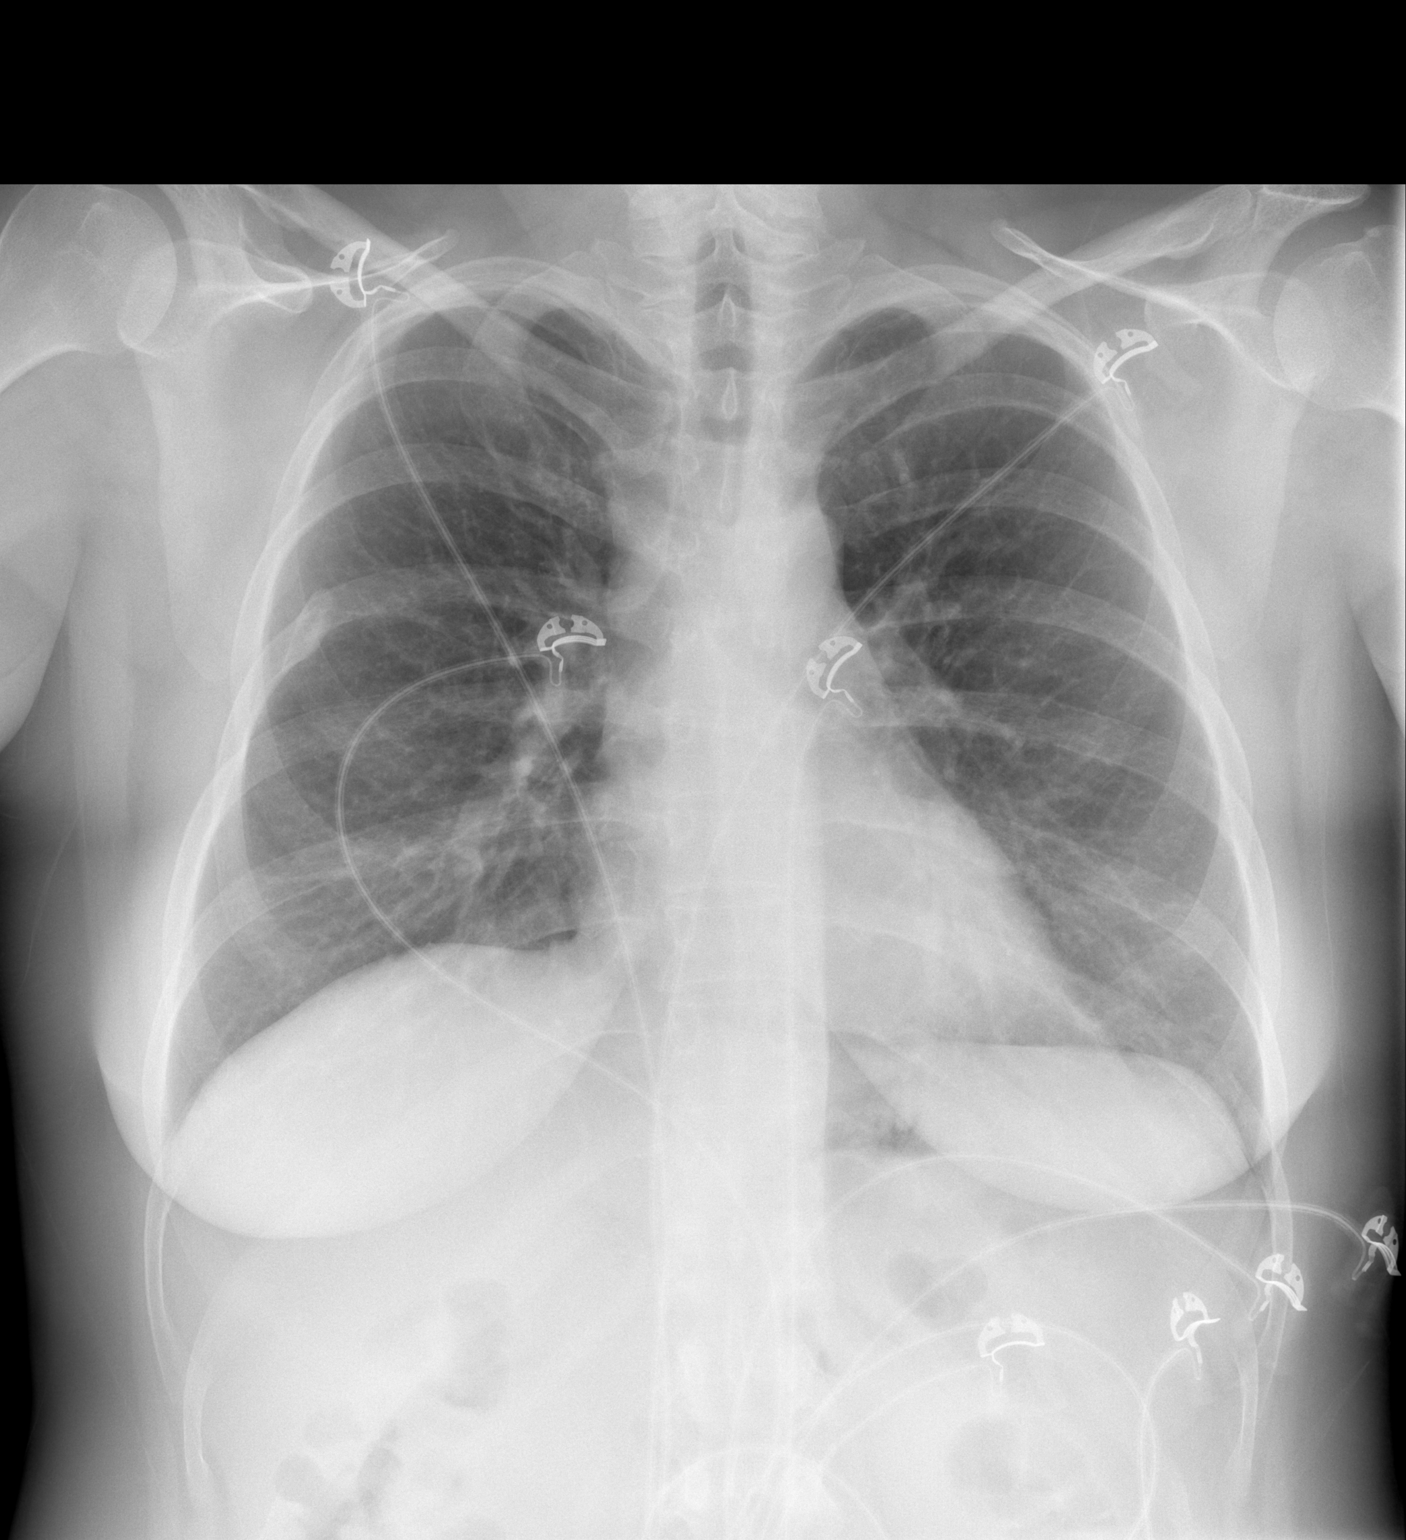

[w chest lat]
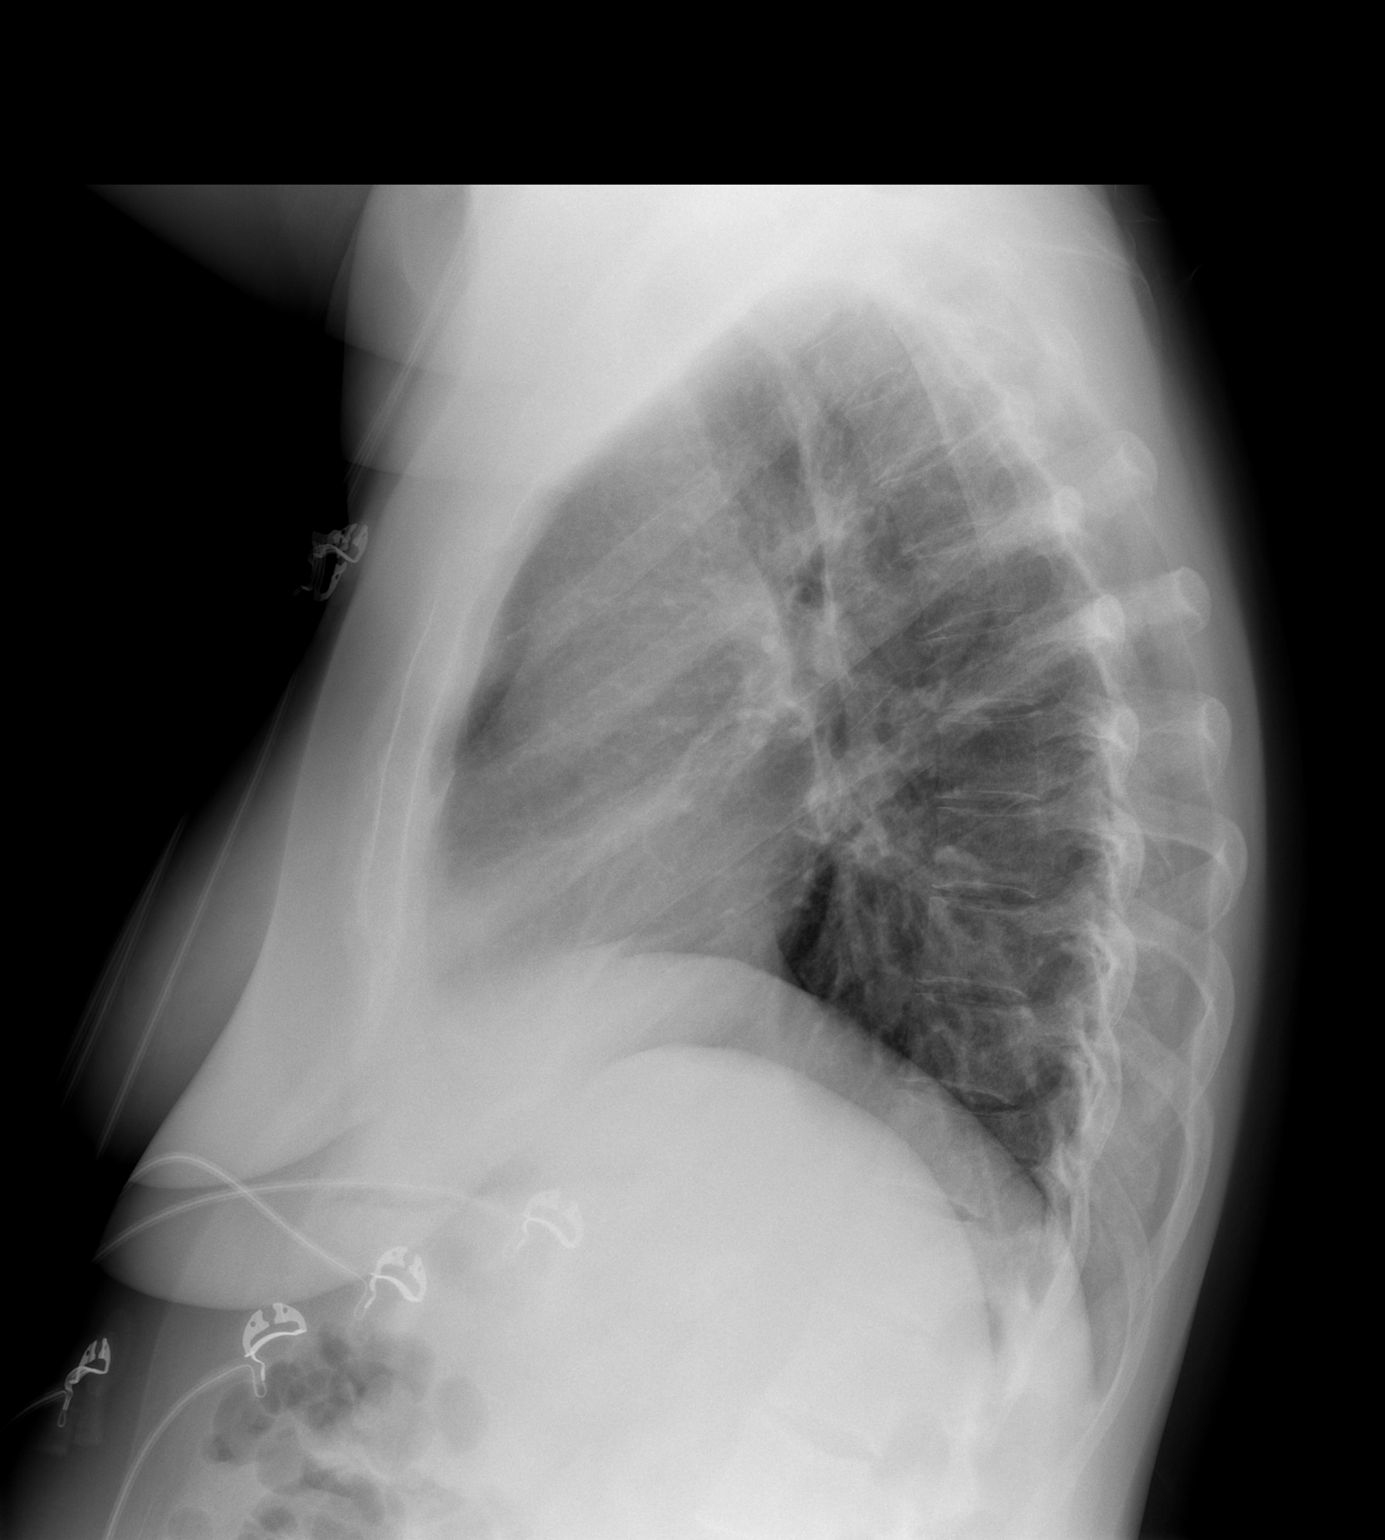

[2 of 2 positions shown; findings below may reference images not displayed]

FINDINGS: The lungs are clear.  Mediastinal contours are normal.
The heart is within normal limits in size.  An old healed right
lateral sixth rib fracture is noted.
IMPRESSION: Stable chest x-ray.  No active lung disease.

## 2011-08-15 DIAGNOSIS — R109 Unspecified abdominal pain: Secondary | ICD-10-CM | POA: Insufficient documentation

## 2011-08-15 NOTE — Assessment & Plan Note (Signed)
Obtain cbc, chem7, lft and amylase. Attempt zofran prn and carafate. Followup if no improvement or worsening. Present to ED with worsening abdominal pain or emesis

## 2011-08-16 ENCOUNTER — Encounter (HOSPITAL_BASED_OUTPATIENT_CLINIC_OR_DEPARTMENT_OTHER): Payer: Self-pay | Admitting: *Deleted

## 2011-08-16 ENCOUNTER — Emergency Department (HOSPITAL_BASED_OUTPATIENT_CLINIC_OR_DEPARTMENT_OTHER)
Admission: EM | Admit: 2011-08-16 | Discharge: 2011-08-16 | Disposition: A | Discharge status: Home / Self Care | Payer: Medicare Other | Attending: Emergency Medicine | Admitting: Emergency Medicine

## 2011-08-16 ENCOUNTER — Emergency Department (INDEPENDENT_AMBULATORY_CARE_PROVIDER_SITE_OTHER): Payer: Medicare Other

## 2011-08-16 DIAGNOSIS — D259 Leiomyoma of uterus, unspecified: Secondary | ICD-10-CM

## 2011-08-16 DIAGNOSIS — R109 Unspecified abdominal pain: Secondary | ICD-10-CM | POA: Diagnosis not present

## 2011-08-16 DIAGNOSIS — E785 Hyperlipidemia, unspecified: Secondary | ICD-10-CM | POA: Insufficient documentation

## 2011-08-16 DIAGNOSIS — M549 Dorsalgia, unspecified: Secondary | ICD-10-CM | POA: Insufficient documentation

## 2011-08-16 DIAGNOSIS — Z79899 Other long term (current) drug therapy: Secondary | ICD-10-CM | POA: Insufficient documentation

## 2011-08-16 DIAGNOSIS — K219 Gastro-esophageal reflux disease without esophagitis: Secondary | ICD-10-CM | POA: Insufficient documentation

## 2011-08-16 DIAGNOSIS — F17200 Nicotine dependence, unspecified, uncomplicated: Secondary | ICD-10-CM | POA: Insufficient documentation

## 2011-08-16 DIAGNOSIS — I10 Essential (primary) hypertension: Secondary | ICD-10-CM | POA: Insufficient documentation

## 2011-08-16 DIAGNOSIS — J45909 Unspecified asthma, uncomplicated: Secondary | ICD-10-CM | POA: Insufficient documentation

## 2011-08-16 DIAGNOSIS — G4730 Sleep apnea, unspecified: Secondary | ICD-10-CM | POA: Insufficient documentation

## 2011-08-16 LAB — URINALYSIS, ROUTINE W REFLEX MICROSCOPIC
Bilirubin Urine: NEGATIVE
Glucose, UA: NEGATIVE mg/dL
Hgb urine dipstick: NEGATIVE
Ketones, ur: NEGATIVE mg/dL
Leukocytes, UA: NEGATIVE
Nitrite: NEGATIVE
Protein, ur: NEGATIVE mg/dL
Specific Gravity, Urine: 1.021 (ref 1.005–1.030)
Urobilinogen, UA: 0.2 mg/dL (ref 0.0–1.0)
pH: 6.5 (ref 5.0–8.0)

## 2011-08-16 LAB — WET PREP, GENITAL
Trich, Wet Prep: NONE SEEN
Yeast Wet Prep HPF POC: NONE SEEN

## 2011-08-16 LAB — PREGNANCY, URINE: Preg Test, Ur: NEGATIVE

## 2011-08-16 MED ORDER — ONDANSETRON 4 MG PO TBDP
4.0000 mg | ORAL_TABLET | Freq: Once | ORAL | Status: AC
  Administered 2011-08-16: 4 mg via ORAL

## 2011-08-16 MED ORDER — ONDANSETRON 4 MG PO TBDP
ORAL_TABLET | ORAL | Status: AC
  Administered 2011-08-16: 4 mg via ORAL
  Filled 2011-08-16: qty 1
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
## 2011-08-16 NOTE — ED Notes (Signed)
Unable to round on pt. Pt is in [place].

## 2011-08-16 NOTE — ED Provider Notes (Signed)
Medical screening examination/treatment/procedure(s) were performed by non-physician practitioner and as supervising physician I was immediately available for consultation/collaboration.   Ankita Newcomer, MD 08/16/11 2308 

## 2011-08-17 LAB — GC/CHLAMYDIA PROBE AMP, GENITAL
Chlamydia, DNA Probe: NEGATIVE
GC Probe Amp, Genital: NEGATIVE

## 2011-08-23 DIAGNOSIS — F339 Major depressive disorder, recurrent, unspecified: Secondary | ICD-10-CM | POA: Diagnosis not present

## 2011-08-24 ENCOUNTER — Emergency Department
Admit: 2011-08-24 | Discharge: 2011-08-24 | Disposition: A | Discharge status: Home / Self Care | Payer: Medicare Other | Attending: Family Medicine | Admitting: Family Medicine

## 2011-08-24 ENCOUNTER — Encounter: Payer: Self-pay | Admitting: *Deleted

## 2011-08-24 ENCOUNTER — Emergency Department (INDEPENDENT_AMBULATORY_CARE_PROVIDER_SITE_OTHER)
Admission: EM | Admit: 2011-08-24 | Discharge: 2011-08-24 | Disposition: A | Discharge status: Home / Self Care | Payer: Medicare Other | Source: Home / Self Care | Attending: Family Medicine | Admitting: Family Medicine

## 2011-08-24 DIAGNOSIS — R5381 Other malaise: Secondary | ICD-10-CM

## 2011-08-24 DIAGNOSIS — K117 Disturbances of salivary secretion: Secondary | ICD-10-CM

## 2011-08-24 DIAGNOSIS — J310 Chronic rhinitis: Secondary | ICD-10-CM

## 2011-08-24 DIAGNOSIS — Z79899 Other long term (current) drug therapy: Secondary | ICD-10-CM | POA: Diagnosis not present

## 2011-08-24 DIAGNOSIS — R5383 Other fatigue: Secondary | ICD-10-CM

## 2011-08-24 DIAGNOSIS — R682 Dry mouth, unspecified: Secondary | ICD-10-CM

## 2011-08-24 DIAGNOSIS — E119 Type 2 diabetes mellitus without complications: Secondary | ICD-10-CM | POA: Diagnosis not present

## 2011-08-24 LAB — POCT CBC W AUTO DIFF (K'VILLE URGENT CARE)

## 2011-08-24 LAB — POCT URINALYSIS DIPSTICK
Bilirubin, UA: NEGATIVE
Blood, UA: NEGATIVE
Glucose, UA: NEGATIVE
Ketones, UA: NEGATIVE
Leukocytes, UA: NEGATIVE
Nitrite, UA: NEGATIVE
Protein, UA: NEGATIVE
Spec Grav, UA: >=1.03
Urobilinogen, UA: 0.2
pH, UA: 6
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
## 2011-08-24 NOTE — ED Notes (Signed)
Patient c/o "feeling dry" for 1 week. She also c/o upper back pain x 1 week that is intermittent. She cough up phlegm in the AM but does not complain of a cough. She did receive a flu shot this season.

## 2011-08-25 DIAGNOSIS — R0609 Other forms of dyspnea: Secondary | ICD-10-CM | POA: Diagnosis not present

## 2011-08-25 DIAGNOSIS — R0989 Other specified symptoms and signs involving the circulatory and respiratory systems: Secondary | ICD-10-CM | POA: Diagnosis not present

## 2011-08-25 DIAGNOSIS — R221 Localized swelling, mass and lump, neck: Secondary | ICD-10-CM | POA: Diagnosis not present

## 2011-08-25 DIAGNOSIS — R070 Pain in throat: Secondary | ICD-10-CM | POA: Diagnosis not present

## 2011-08-25 DIAGNOSIS — K117 Disturbances of salivary secretion: Secondary | ICD-10-CM | POA: Diagnosis not present

## 2011-08-25 DIAGNOSIS — Z888 Allergy status to other drugs, medicaments and biological substances status: Secondary | ICD-10-CM | POA: Diagnosis not present

## 2011-08-25 DIAGNOSIS — R0602 Shortness of breath: Secondary | ICD-10-CM | POA: Diagnosis not present

## 2011-08-25 DIAGNOSIS — Z881 Allergy status to other antibiotic agents status: Secondary | ICD-10-CM | POA: Diagnosis not present

## 2011-08-25 DIAGNOSIS — R061 Stridor: Secondary | ICD-10-CM | POA: Diagnosis not present

## 2011-08-25 DIAGNOSIS — Z79899 Other long term (current) drug therapy: Secondary | ICD-10-CM | POA: Diagnosis not present

## 2011-08-25 DIAGNOSIS — Z880 Allergy status to penicillin: Secondary | ICD-10-CM | POA: Diagnosis not present

## 2011-08-25 DIAGNOSIS — R220 Localized swelling, mass and lump, head: Secondary | ICD-10-CM | POA: Diagnosis not present

## 2011-08-25 DIAGNOSIS — R599 Enlarged lymph nodes, unspecified: Secondary | ICD-10-CM | POA: Diagnosis not present

## 2011-08-25 DIAGNOSIS — Z882 Allergy status to sulfonamides status: Secondary | ICD-10-CM | POA: Diagnosis not present

## 2011-08-25 DIAGNOSIS — Z885 Allergy status to narcotic agent status: Secondary | ICD-10-CM | POA: Diagnosis not present

## 2011-08-25 DIAGNOSIS — J029 Acute pharyngitis, unspecified: Secondary | ICD-10-CM | POA: Diagnosis not present

## 2011-08-25 LAB — COMPREHENSIVE METABOLIC PANEL
ALT: 21 U/L (ref 0–35)
AST: 18 U/L (ref 0–37)
Albumin: 4.4 g/dL (ref 3.5–5.2)
Alkaline Phosphatase: 60 U/L (ref 39–117)
BUN: 12 mg/dL (ref 6–23)
CO2: 15 mEq/L — ABNORMAL LOW (ref 19–32)
Calcium: 9.1 mg/dL (ref 8.4–10.5)
Chloride: 107 mEq/L (ref 96–112)
Creat: 0.49 mg/dL — ABNORMAL LOW (ref 0.50–1.10)
Glucose, Bld: 81 mg/dL (ref 70–99)
Potassium: 4.7 mEq/L (ref 3.5–5.3)
Sodium: 139 mEq/L (ref 135–145)
Total Bilirubin: 0.3 mg/dL (ref 0.3–1.2)
Total Protein: 7 g/dL (ref 6.0–8.3)

## 2011-08-25 LAB — HEMOGLOBIN A1C
Hgb A1c MFr Bld: 5.6 % (ref <5.7)
Mean Plasma Glucose: 114 mg/dL (ref <117)

## 2011-08-28 DIAGNOSIS — F339 Major depressive disorder, recurrent, unspecified: Secondary | ICD-10-CM | POA: Diagnosis not present

## 2011-08-29 ENCOUNTER — Ambulatory Visit: Payer: Medicare Other | Admitting: Neurology

## 2011-08-29 ENCOUNTER — Telehealth: Payer: Self-pay | Admitting: Critical Care Medicine

## 2011-08-29 DIAGNOSIS — G501 Atypical facial pain: Secondary | ICD-10-CM | POA: Diagnosis not present

## 2011-08-29 DIAGNOSIS — J45909 Unspecified asthma, uncomplicated: Secondary | ICD-10-CM | POA: Diagnosis not present

## 2011-08-29 DIAGNOSIS — D649 Anemia, unspecified: Secondary | ICD-10-CM | POA: Diagnosis not present

## 2011-08-29 DIAGNOSIS — F411 Generalized anxiety disorder: Secondary | ICD-10-CM | POA: Diagnosis not present

## 2011-08-29 NOTE — Telephone Encounter (Signed)
Spoke with pt. She states that she was seen at [facility] recently "not feeling well" and was told that her co2 level was abnormal. She states that they could not find anything else wrong during that work up. She then started to have some chest discomfort and states that she gets the feeling where she is unable to take in a good, deep enough breath. Pt states that she was seen by cards regarding the chest discomfort and was told that the issue is not heart related, and she was instructed to call here. She already was scheduled for an appt with PW in [place] for 08/31/11. And I advised that she just keep this appt and should seek emergency care sooner if needed. Pt verbalized understanding and states nothing further needed.

## 2011-08-30 ENCOUNTER — Encounter: Payer: Self-pay | Admitting: Family Medicine

## 2011-08-30 ENCOUNTER — Emergency Department (HOSPITAL_BASED_OUTPATIENT_CLINIC_OR_DEPARTMENT_OTHER)
Admission: EM | Admit: 2011-08-30 | Discharge: 2011-08-30 | Disposition: A | Discharge status: Home / Self Care | Payer: Medicare Other | Attending: Emergency Medicine | Admitting: Emergency Medicine

## 2011-08-30 ENCOUNTER — Ambulatory Visit (INDEPENDENT_AMBULATORY_CARE_PROVIDER_SITE_OTHER): Payer: Medicare Other | Admitting: Family Medicine

## 2011-08-30 ENCOUNTER — Ambulatory Visit: Payer: Medicare Other | Admitting: Neurology

## 2011-08-30 ENCOUNTER — Encounter (HOSPITAL_BASED_OUTPATIENT_CLINIC_OR_DEPARTMENT_OTHER): Payer: Self-pay | Admitting: *Deleted

## 2011-08-30 VITALS — BP 156/83 | HR 91 | Temp 98.8°F | Wt 178.0 lb

## 2011-08-30 DIAGNOSIS — K219 Gastro-esophageal reflux disease without esophagitis: Secondary | ICD-10-CM | POA: Insufficient documentation

## 2011-08-30 DIAGNOSIS — J45909 Unspecified asthma, uncomplicated: Secondary | ICD-10-CM | POA: Insufficient documentation

## 2011-08-30 DIAGNOSIS — R0989 Other specified symptoms and signs involving the circulatory and respiratory systems: Secondary | ICD-10-CM | POA: Diagnosis not present

## 2011-08-30 DIAGNOSIS — R5383 Other fatigue: Secondary | ICD-10-CM

## 2011-08-30 DIAGNOSIS — R799 Abnormal finding of blood chemistry, unspecified: Secondary | ICD-10-CM

## 2011-08-30 DIAGNOSIS — R0600 Dyspnea, unspecified: Secondary | ICD-10-CM

## 2011-08-30 DIAGNOSIS — R7989 Other specified abnormal findings of blood chemistry: Secondary | ICD-10-CM

## 2011-08-30 DIAGNOSIS — R0609 Other forms of dyspnea: Secondary | ICD-10-CM | POA: Insufficient documentation

## 2011-08-30 DIAGNOSIS — M25519 Pain in unspecified shoulder: Secondary | ICD-10-CM | POA: Diagnosis not present

## 2011-08-30 DIAGNOSIS — G4730 Sleep apnea, unspecified: Secondary | ICD-10-CM | POA: Diagnosis not present

## 2011-08-30 DIAGNOSIS — I10 Essential (primary) hypertension: Secondary | ICD-10-CM | POA: Diagnosis not present

## 2011-08-30 DIAGNOSIS — Z23 Encounter for immunization: Secondary | ICD-10-CM

## 2011-08-30 DIAGNOSIS — E785 Hyperlipidemia, unspecified: Secondary | ICD-10-CM | POA: Diagnosis not present

## 2011-08-30 DIAGNOSIS — R21 Rash and other nonspecific skin eruption: Secondary | ICD-10-CM

## 2011-08-30 DIAGNOSIS — R059 Cough, unspecified: Secondary | ICD-10-CM

## 2011-08-30 DIAGNOSIS — R5381 Other malaise: Secondary | ICD-10-CM | POA: Diagnosis not present

## 2011-08-30 DIAGNOSIS — R05 Cough: Secondary | ICD-10-CM

## 2011-08-30 LAB — URINALYSIS, ROUTINE W REFLEX MICROSCOPIC
Bilirubin Urine: NEGATIVE
Glucose, UA: NEGATIVE mg/dL
Hgb urine dipstick: NEGATIVE
Ketones, ur: NEGATIVE mg/dL
Leukocytes, UA: NEGATIVE
Nitrite: NEGATIVE
Protein, ur: NEGATIVE mg/dL
Specific Gravity, Urine: 1.029 (ref 1.005–1.030)
Urobilinogen, UA: 0.2 mg/dL (ref 0.0–1.0)
pH: 6 (ref 5.0–8.0)

## 2011-08-30 LAB — POCT I-STAT 3, ART BLOOD GAS (G3+)
Acid-base deficit: 1 mmol/L (ref 0.0–2.0)
Bicarbonate: 21.3 mEq/L (ref 20.0–24.0)
O2 Saturation: 99 %
Patient temperature: 37
TCO2: 22 mmol/L (ref 0–100)
pCO2 arterial: 28.2 mmHg — ABNORMAL LOW (ref 35.0–45.0)
pH, Arterial: 7.487 — ABNORMAL HIGH (ref 7.350–7.400)
pO2, Arterial: 105 mmHg — ABNORMAL HIGH (ref 80.0–100.0)

## 2011-08-30 LAB — URINE MICROSCOPIC-ADD ON

## 2011-08-30 NOTE — Discharge Instructions (Signed)
Jennifer [name], you had physical examination, arterial blood gas testing, and urinalysis to check on you for a feeling of trouble breathing. Fortunately, your lab tests and exam are good today. Be should be sure to keep your appointment with Jennifer [name] M.D., pulmonologist, as you have an appointment to see him tomorrow.

## 2011-08-30 NOTE — Progress Notes (Signed)
  Subjective:    Patient ID: Jennifer [name], female    DOB: Nov 04, 1965, 46 y.o.   MRN: [id number]  HPI Has been coughing and SOB, it has been intermittent and not consistent. Cough is mostly dry with some occ green phlegm.  Steam in the shower helps her clear her throat. Having pain in her Left anterior rib cage with her cough.  Throat feels irritated.  Sees Dr. Delford Field tomorrow.  She is also using some type of ? Flutter valve? But she is not sure if she is using it correctly. Has felt SOB today and has been coughing a little more. No fever. No nasal congestion or headaches.. She has not tried using her inhaler even though she says she feels a little short of breath today.  Needs referral to Endocrinology. Says previous MD left and she didn't f/u with a new doc. Says they wanted ot do a steroid suppression test (? dexamethasone suppression test). Also c/o some LN in her axilla and neck and swell intermittantly.   Heel itching on her right foot.  She says she has noticed itchiness for several weeks now. She will often wake up at night and so starts scratching it. No blisters or skin changes except for some redness where she has been scratching.   Review of Systems     Objective:   Physical Exam  Constitutional: She is oriented to person, place, and time. She appears well-developed and well-nourished.  HENT:  Head: Normocephalic and atraumatic.  Right Ear: External ear normal.  Left Ear: External ear normal.  Nose: Nose normal.  Mouth/Throat: Oropharynx is clear and moist.       TMs and canals are clear.   Eyes: Conjunctivae and EOM are normal. Pupils are equal, round, and reactive to light.  Neck: Neck supple. No thyromegaly present.  Cardiovascular: Normal rate, regular rhythm and normal heart sounds.   Pulmonary/Chest: Effort normal and breath sounds normal. She has no wheezes.  Lymphadenopathy:    She has no cervical adenopathy.  Neurological: She is alert and oriented to person,  place, and time.  Skin: Skin is warm and dry.  Psychiatric: She has a normal mood and affect.   On the right posterior heel she does have some mild erythema. No actual breaks in the skin. No scaling. She does have very dry and it heals. But her area of discomfort is just above this.       Assessment & Plan:  Cough - gave her reassurance. Recommend humidifier, user her xopenex when gets home, and consider trial of plain mucinex.  No fever. Keep appt with pulm tomorrow. Her lung exam is clear today. She has no fever. Her pulse ox is reassuring.  Abnormla CO2. Recheck it week. I'm not sure why this would certainly be low when it was normal 3 weeks ago. It is possible she was hyperventilating. He'll be a very abrupt change to be secondary to kidney disease.    Rash on the back of the right heel - Did scraping for KOH.  If that is negative then I will treat her with topical steroid cream.  Fatigue - Will refer to endocrinology.  [facility] is where she was seen before. She would like to go back there.  25 min spent face to face discussing her care plan.

## 2011-08-30 NOTE — Patient Instructions (Signed)
Try the plain Mucinex to help with the phlegm in her chest. Also recommend using a humidifier since the dry heat  Is running in the wintertime. Continue to use ear nasal saline. Also her murmur to take care now with you to your appointment tomorrow so they can help you learn to use it properly. We will call you with your lab results.
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
## 2011-08-30 NOTE — ED Notes (Signed)
Was seen earlier today by her MD for cough and sob. After she got home she started feeling bad. States her lungs hurt. Is trying to get an appointment with Dr Delford Field.

## 2011-08-31 ENCOUNTER — Ambulatory Visit: Payer: Medicare Other | Admitting: Neurology

## 2011-08-31 ENCOUNTER — Encounter: Payer: Self-pay | Admitting: Critical Care Medicine

## 2011-08-31 ENCOUNTER — Ambulatory Visit: Payer: Medicare Other | Admitting: Family Medicine

## 2011-08-31 ENCOUNTER — Telehealth: Payer: Self-pay

## 2011-08-31 ENCOUNTER — Ambulatory Visit (INDEPENDENT_AMBULATORY_CARE_PROVIDER_SITE_OTHER): Payer: Medicare Other | Admitting: Critical Care Medicine

## 2011-08-31 DIAGNOSIS — R21 Rash and other nonspecific skin eruption: Secondary | ICD-10-CM | POA: Diagnosis not present

## 2011-08-31 DIAGNOSIS — F339 Major depressive disorder, recurrent, unspecified: Secondary | ICD-10-CM | POA: Diagnosis not present

## 2011-08-31 DIAGNOSIS — J383 Other diseases of vocal cords: Secondary | ICD-10-CM

## 2011-08-31 LAB — CO2, TOTAL: CO2: 22 mEq/L (ref 19–32)

## 2011-08-31 MED ORDER — BENZONATATE 100 MG PO CAPS: ORAL_CAPSULE | ORAL | Status: AC

## 2011-08-31 MED ORDER — CHLORPHENIRAMINE MALEATE CR 8 MG PO CPCR: 8.0000 mg | ORAL_CAPSULE | Freq: Every day | ORAL | Status: AC
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
## 2011-08-31 NOTE — Patient Instructions (Addendum)
Keep a sugar free lozenge in mouth at all times, train self to swallow , not cough Try benzonatate 1 every 4 hours as needed for cough Use flutter valve 2-3 times daily as needed Take aciphex 1/2 hour before a meal then eat Try chlorpheniramine 8mg  at bedtime for drainage No antibiotics Return as needed  (chlorpheniramine and tessalon sent to pharmacy downstairs)

## 2011-08-31 NOTE — Assessment & Plan Note (Addendum)
[place] vocal cord dysfunction with GERD ppt factor and some postnasal drip.  No acute infection No primary pulmonary issues Plan Reassurance No abx given reinstruction on flutter valve Keep a sugar free lozenge in mouth at all times, train self to swallow , not cough Try benzonatate 1 every 4 hours as needed for cough Use flutter valve 2-3 times daily as needed Take aciphex 1/2 hour before a meal then eat Try chlorpheniramine 8mg  at bedtime for drainage

## 2011-08-31 NOTE — Progress Notes (Signed)
Addended by: Avon Gully C on: 08/31/2011 11:15 AM   Modules accepted: Orders

## 2011-08-31 NOTE — Telephone Encounter (Signed)
Pt notified of appt at [facility] on 11/20/11 at 8:30am with Dr. Daphine Deutscher

## 2011-09-01 ENCOUNTER — Other Ambulatory Visit: Payer: Self-pay | Admitting: Family Medicine

## 2011-09-01 LAB — KOH PREP: RESULT - KOH: NONE SEEN

## 2011-09-01 MED ORDER — TRIAMCINOLONE ACETONIDE 0.5 % EX OINT: TOPICAL_OINTMENT | Freq: Two times a day (BID) | CUTANEOUS | Status: DC

## 2011-09-04 DIAGNOSIS — N949 Unspecified condition associated with female genital organs and menstrual cycle: Secondary | ICD-10-CM | POA: Diagnosis not present

## 2011-09-04 DIAGNOSIS — Z79899 Other long term (current) drug therapy: Secondary | ICD-10-CM | POA: Diagnosis not present

## 2011-09-04 DIAGNOSIS — D259 Leiomyoma of uterus, unspecified: Secondary | ICD-10-CM | POA: Diagnosis not present

## 2011-09-04 DIAGNOSIS — K6289 Other specified diseases of anus and rectum: Secondary | ICD-10-CM | POA: Diagnosis not present

## 2011-09-06 DIAGNOSIS — K117 Disturbances of salivary secretion: Secondary | ICD-10-CM | POA: Diagnosis not present

## 2011-09-06 DIAGNOSIS — R1110 Vomiting, unspecified: Secondary | ICD-10-CM | POA: Diagnosis not present

## 2011-09-06 DIAGNOSIS — R6883 Chills (without fever): Secondary | ICD-10-CM | POA: Diagnosis not present

## 2011-09-06 DIAGNOSIS — F458 Other somatoform disorders: Secondary | ICD-10-CM | POA: Diagnosis not present

## 2011-09-06 DIAGNOSIS — M7989 Other specified soft tissue disorders: Secondary | ICD-10-CM | POA: Diagnosis not present

## 2011-09-06 DIAGNOSIS — J45909 Unspecified asthma, uncomplicated: Secondary | ICD-10-CM | POA: Diagnosis not present

## 2011-09-06 DIAGNOSIS — J029 Acute pharyngitis, unspecified: Secondary | ICD-10-CM | POA: Diagnosis not present

## 2011-09-06 DIAGNOSIS — B370 Candidal stomatitis: Secondary | ICD-10-CM | POA: Diagnosis not present

## 2011-09-06 DIAGNOSIS — R0602 Shortness of breath: Secondary | ICD-10-CM | POA: Diagnosis not present

## 2011-09-06 DIAGNOSIS — M79609 Pain in unspecified limb: Secondary | ICD-10-CM | POA: Diagnosis not present

## 2011-09-06 DIAGNOSIS — R1310 Dysphagia, unspecified: Secondary | ICD-10-CM | POA: Diagnosis not present

## 2011-09-06 DIAGNOSIS — R0789 Other chest pain: Secondary | ICD-10-CM | POA: Diagnosis not present

## 2011-09-06 DIAGNOSIS — R079 Chest pain, unspecified: Secondary | ICD-10-CM | POA: Diagnosis not present

## 2011-09-06 DIAGNOSIS — K219 Gastro-esophageal reflux disease without esophagitis: Secondary | ICD-10-CM | POA: Diagnosis not present

## 2011-09-06 DIAGNOSIS — H04129 Dry eye syndrome of unspecified lacrimal gland: Secondary | ICD-10-CM | POA: Diagnosis not present

## 2011-09-06 DIAGNOSIS — M6281 Muscle weakness (generalized): Secondary | ICD-10-CM | POA: Diagnosis not present

## 2011-09-06 DIAGNOSIS — R070 Pain in throat: Secondary | ICD-10-CM | POA: Diagnosis not present

## 2011-09-07 DIAGNOSIS — F339 Major depressive disorder, recurrent, unspecified: Secondary | ICD-10-CM | POA: Diagnosis not present

## 2011-09-07 DIAGNOSIS — R1310 Dysphagia, unspecified: Secondary | ICD-10-CM | POA: Diagnosis not present

## 2011-09-11 ENCOUNTER — Encounter: Payer: Self-pay | Admitting: Critical Care Medicine

## 2011-09-11 ENCOUNTER — Telehealth: Payer: Self-pay | Admitting: Critical Care Medicine

## 2011-09-11 ENCOUNTER — Ambulatory Visit (INDEPENDENT_AMBULATORY_CARE_PROVIDER_SITE_OTHER): Payer: Medicare Other | Admitting: Critical Care Medicine

## 2011-09-11 ENCOUNTER — Ambulatory Visit (HOSPITAL_BASED_OUTPATIENT_CLINIC_OR_DEPARTMENT_OTHER)
Admission: RE | Admit: 2011-09-11 | Discharge: 2011-09-11 | Disposition: A | Discharge status: Home / Self Care | Payer: Medicare Other | Source: Ambulatory Visit | Attending: Critical Care Medicine | Admitting: Critical Care Medicine

## 2011-09-11 VITALS — BP 140/90 | HR 90 | Temp 98.2°F | Ht 62.0 in | Wt 180.0 lb

## 2011-09-11 DIAGNOSIS — R058 Other specified cough: Secondary | ICD-10-CM

## 2011-09-11 DIAGNOSIS — J328 Other chronic sinusitis: Secondary | ICD-10-CM

## 2011-09-11 DIAGNOSIS — R059 Cough, unspecified: Secondary | ICD-10-CM

## 2011-09-11 DIAGNOSIS — R05 Cough: Secondary | ICD-10-CM

## 2011-09-11 DIAGNOSIS — R079 Chest pain, unspecified: Secondary | ICD-10-CM | POA: Diagnosis not present

## 2011-09-11 MED ORDER — ALBUTEROL SULFATE HFA 108 (90 BASE) MCG/ACT IN AERS: 2.0000 | INHALATION_SPRAY | RESPIRATORY_TRACT | Status: DC | PRN

## 2011-09-11 MED ORDER — CEFDINIR 300 MG PO CAPS: 300.0000 mg | ORAL_CAPSULE | Freq: Every day | ORAL | Status: DC

## 2011-09-11 MED ORDER — PREDNISONE 10 MG PO TABS: 10.0000 mg | ORAL_TABLET | Freq: Every day | ORAL | Status: DC

## 2011-09-11 NOTE — Assessment & Plan Note (Signed)
Note acute sinusitis on exam and assoc postnasal drip with flare of upper airway and cyclical cough CXR shows neg for acute process Plan Cefdinir 300mg /d x 7days Prednisone 10mg  /d x 5 days

## 2011-09-11 NOTE — Patient Instructions (Signed)
Use proair as needed 1-2 puff every 4 hours for shortness of breath (your insurance would not approve xopenex) Prednisone 10mg  Take one daily for 5 days then stop Cefdinir 300mg  daily for 7days No other medication change Return one month

## 2011-09-11 NOTE — Telephone Encounter (Signed)
Cefdinir is similar to keflex,  In any case ok for keflex 250mg  tid x 7days
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
## 2011-09-11 NOTE — Telephone Encounter (Signed)
Spoke with pt. She states that she is not going to take the cefdinir prescribed at today's ov b/c she fears will have allergic reaction. She states that med is "too close to the PCN family". She wants to be prescribed keflex, b/c has tolerated this w/o any problems in the past. PW, please advise, thanks!

## 2011-09-12 MED ORDER — CEPHALEXIN 250 MG PO CAPS: 250.0000 mg | ORAL_CAPSULE | Freq: Three times a day (TID) | ORAL | Status: DC

## 2011-09-12 NOTE — Telephone Encounter (Signed)
Spoke with pt and notified of recs per PW. Pt verbalized understanding, states nothing further needed.  Rx for keflex was sent to pharm.

## 2011-09-15 DIAGNOSIS — R0602 Shortness of breath: Secondary | ICD-10-CM | POA: Diagnosis not present

## 2011-09-15 DIAGNOSIS — R51 Headache: Secondary | ICD-10-CM | POA: Diagnosis not present

## 2011-09-15 DIAGNOSIS — R030 Elevated blood-pressure reading, without diagnosis of hypertension: Secondary | ICD-10-CM | POA: Diagnosis not present

## 2011-09-15 DIAGNOSIS — R141 Gas pain: Secondary | ICD-10-CM | POA: Diagnosis not present

## 2011-09-15 DIAGNOSIS — R142 Eructation: Secondary | ICD-10-CM | POA: Diagnosis not present

## 2011-09-15 DIAGNOSIS — Z79899 Other long term (current) drug therapy: Secondary | ICD-10-CM | POA: Diagnosis not present

## 2011-09-15 DIAGNOSIS — M25519 Pain in unspecified shoulder: Secondary | ICD-10-CM | POA: Diagnosis not present

## 2011-09-15 DIAGNOSIS — I10 Essential (primary) hypertension: Secondary | ICD-10-CM | POA: Diagnosis not present

## 2011-09-16 ENCOUNTER — Emergency Department (HOSPITAL_BASED_OUTPATIENT_CLINIC_OR_DEPARTMENT_OTHER)
Admission: EM | Admit: 2011-09-16 | Discharge: 2011-09-17 | Disposition: A | Discharge status: Home / Self Care | Payer: Medicare Other | Attending: Emergency Medicine | Admitting: Emergency Medicine

## 2011-09-16 ENCOUNTER — Emergency Department (INDEPENDENT_AMBULATORY_CARE_PROVIDER_SITE_OTHER): Payer: Medicare Other

## 2011-09-16 ENCOUNTER — Other Ambulatory Visit: Payer: Self-pay

## 2011-09-16 DIAGNOSIS — G4730 Sleep apnea, unspecified: Secondary | ICD-10-CM | POA: Diagnosis not present

## 2011-09-16 DIAGNOSIS — Z79899 Other long term (current) drug therapy: Secondary | ICD-10-CM | POA: Insufficient documentation

## 2011-09-16 DIAGNOSIS — E785 Hyperlipidemia, unspecified: Secondary | ICD-10-CM | POA: Insufficient documentation

## 2011-09-16 DIAGNOSIS — I10 Essential (primary) hypertension: Secondary | ICD-10-CM

## 2011-09-16 DIAGNOSIS — J45909 Unspecified asthma, uncomplicated: Secondary | ICD-10-CM | POA: Insufficient documentation

## 2011-09-16 DIAGNOSIS — K219 Gastro-esophageal reflux disease without esophagitis: Secondary | ICD-10-CM | POA: Diagnosis not present

## 2011-09-16 DIAGNOSIS — R51 Headache: Secondary | ICD-10-CM

## 2011-09-16 DIAGNOSIS — R0602 Shortness of breath: Secondary | ICD-10-CM | POA: Insufficient documentation

## 2011-09-16 DIAGNOSIS — R059 Cough, unspecified: Secondary | ICD-10-CM | POA: Insufficient documentation

## 2011-09-16 DIAGNOSIS — R05 Cough: Secondary | ICD-10-CM | POA: Insufficient documentation

## 2011-09-16 MED ORDER — SODIUM CHLORIDE 0.9 % IV BOLUS (SEPSIS): 1000.0000 mL | Freq: Once | INTRAVENOUS | Status: DC

## 2011-09-16 MED ORDER — SODIUM CHLORIDE 0.9 % IV SOLN: INTRAVENOUS | Status: DC
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
## 2011-09-16 NOTE — ED Notes (Signed)
Bednar MD at bedside. 

## 2011-09-16 NOTE — ED Notes (Signed)
Pt reports SOB, "chest tight", headache, and elevated b/p since Thursday- states she has been taking norvasc but "makes my heart beat out of chest"- also abd bloating and belching

## 2011-09-17 DIAGNOSIS — R079 Chest pain, unspecified: Secondary | ICD-10-CM | POA: Diagnosis not present

## 2011-09-17 DIAGNOSIS — R000 Tachycardia, unspecified: Secondary | ICD-10-CM | POA: Diagnosis not present

## 2011-09-17 DIAGNOSIS — R0602 Shortness of breath: Secondary | ICD-10-CM | POA: Diagnosis not present

## 2011-09-17 LAB — DIFFERENTIAL
Basophils Absolute: 0 10*3/uL (ref 0.0–0.1)
Basophils Relative: 0 % (ref 0–1)
Eosinophils Absolute: 0.2 10*3/uL (ref 0.0–0.7)
Eosinophils Relative: 3 % (ref 0–5)
Lymphocytes Relative: 23 % (ref 12–46)
Lymphs Abs: 1.8 10*3/uL (ref 0.7–4.0)
Monocytes Absolute: 0.7 10*3/uL (ref 0.1–1.0)
Monocytes Relative: 9 % (ref 3–12)
Neutro Abs: 5 10*3/uL (ref 1.7–7.7)
Neutrophils Relative %: 65 % (ref 43–77)

## 2011-09-17 LAB — BASIC METABOLIC PANEL
BUN: 16 mg/dL (ref 6–23)
CO2: 24 mEq/L (ref 19–32)
Calcium: 9.4 mg/dL (ref 8.4–10.5)
Chloride: 106 mEq/L (ref 96–112)
Creatinine, Ser: 0.6 mg/dL (ref 0.50–1.10)
GFR calc Af Amer: >90 mL/min (ref >90)
GFR calc non Af Amer: >90 mL/min (ref >90)
Glucose, Bld: 125 mg/dL — ABNORMAL HIGH (ref 70–99)
Potassium: 3.8 mEq/L (ref 3.5–5.1)
Sodium: 142 mEq/L (ref 135–145)

## 2011-09-17 LAB — CBC
HCT: 38.6 % (ref 36.0–46.0)
Hemoglobin: 12.9 g/dL (ref 12.0–15.0)
MCH: 29.8 pg (ref 26.0–34.0)
MCHC: 33.4 g/dL (ref 30.0–36.0)
MCV: 89.1 fL (ref 78.0–100.0)
Platelets: 197 10*3/uL (ref 150–400)
RBC: 4.33 MIL/uL (ref 3.87–5.11)
RDW: 13.3 % (ref 11.5–15.5)
WBC: 7.7 10*3/uL (ref 4.0–10.5)

## 2011-09-17 LAB — TROPONIN I: Troponin I: <0.3 ng/mL (ref <0.30)

## 2011-09-17 NOTE — ED Notes (Signed)
D/c home with no new rx- pt calmer at d/c

## 2011-09-17 NOTE — ED Notes (Signed)
Patient was difficult to help with every small task. Patient stated her gown has too much detergent causing her skin irritability. Patient not sitting still nor relaxing her shoulders as I asked, which caused ECG leads to pull off her skin. I helped place patient into paper scrub top to satisfy detergent issue. I continued to place patient on monitor, patient continued to squirm and jerk away from nurse trying to draw blood and start an i.v.

## 2011-09-17 NOTE — ED Notes (Signed)
EDP Bednar in to assess pt- pt immediately defensive- when asked by EDP "what made you decide to come to the emergency room tonight" pt replied "If you don't help me I'm going to [place]"- during assessment pt noted to be irritable- states "you don't know what's going on with me" and "you don't need to review my records, that's not what going on with me now"- pt c/o multiple symptoms including chest tightness, short of breath, headache, belching, abdominal bloating, left arm weakness- adamantly states "this is not anxiety"- appears aggitated

## 2011-09-17 NOTE — ED Notes (Signed)
Attempting to access chart for RRT charging purposes.

## 2011-09-17 NOTE — ED Notes (Signed)
Attempt x 2 to start piv in each hand- blood return obtained both times but pt c/o iv hurting with attempt to thread- EDP Bednar updated

## 2011-09-18 ENCOUNTER — Ambulatory Visit (INDEPENDENT_AMBULATORY_CARE_PROVIDER_SITE_OTHER): Payer: Medicare Other | Admitting: Family Medicine

## 2011-09-18 ENCOUNTER — Encounter: Payer: Self-pay | Admitting: Family Medicine

## 2011-09-18 VITALS — BP 132/85 | HR 98 | Temp 98.4°F | Wt 181.0 lb

## 2011-09-18 DIAGNOSIS — I10 Essential (primary) hypertension: Secondary | ICD-10-CM

## 2011-09-18 MED ORDER — TELMISARTAN 40 MG PO TABS: 20.0000 mg | ORAL_TABLET | Freq: Every day | ORAL | Status: DC

## 2011-09-18 NOTE — Progress Notes (Signed)
  Subjective:    Patient ID: Jennifer [name], female    DOB: [birth year], 46 y.o.   MRN: [id number]  HPI Hx of HTN - BP has been up and down again.  Has been gettting her HA again. Started to get it in the back of her head. Has also been having left shoulder pain and hands are gong numb.  She is very fearful to take anything. She reports several back braces in the past with other medications it just makes her nervous. She did do a lisinopril in the past but it caused a cough so it had to be discontinued. Otherwise she did tolerate well. She felt that she had increase in palpitations with amlodipine. She feels she does not tolerate beta blockers because it increases her shortness of breath.   Review of Systems     Objective:   Physical Exam  Constitutional: She is oriented to person, place, and time. She appears well-developed and well-nourished.  HENT:  Head: Normocephalic and atraumatic.  Cardiovascular: Normal rate, regular rhythm and normal heart sounds.   Pulmonary/Chest: Effort normal and breath sounds normal.  Neurological: She is alert and oriented to person, place, and time.  Skin: Skin is warm and dry.  Psychiatric: She has a normal mood and affect. Her behavior is normal.          Assessment & Plan:  HTN - not well controlled today it is elevated. We discussed low-salt diet to help keep her blood pressures more steady. She does tend to fluctuate. We discussed that she may just need to check her blood pressure every day and if it is well-controlled her low she states her medication but otherwise takes it daily. I gave her a sample of Micardis 40 mg. She took half a tablet and we watched her for the next 3 hours to make sure that she did not bottom out her blood pressure which is what she was most concerned about. Her blood pressure came down nicely into the low 130s. She tolerated it well so to give her a sample box or try for 2 weeks.

## 2011-09-19 DIAGNOSIS — M542 Cervicalgia: Secondary | ICD-10-CM | POA: Diagnosis not present

## 2011-09-19 DIAGNOSIS — M25519 Pain in unspecified shoulder: Secondary | ICD-10-CM | POA: Diagnosis not present

## 2011-09-19 DIAGNOSIS — M19019 Primary osteoarthritis, unspecified shoulder: Secondary | ICD-10-CM | POA: Diagnosis not present

## 2011-09-19 DIAGNOSIS — M67919 Unspecified disorder of synovium and tendon, unspecified shoulder: Secondary | ICD-10-CM | POA: Diagnosis not present

## 2011-09-20 ENCOUNTER — Telehealth: Payer: Self-pay | Admitting: *Deleted

## 2011-09-20 ENCOUNTER — Ambulatory Visit: Payer: Medicare Other | Admitting: Obstetrics & Gynecology

## 2011-09-20 DIAGNOSIS — F339 Major depressive disorder, recurrent, unspecified: Secondary | ICD-10-CM | POA: Diagnosis not present

## 2011-09-20 NOTE — Telephone Encounter (Signed)
Pt called and states he BP was 117/62 and 74 HR and now she is downstairs at behavioral [facility] and it's 150's/90" and pulse is 131. Pt didn't take the micardis this am because BP was normal this am. Advised pt to take the half of micardis and have the nurse there to check it again after her appt. Pt wanted me to let you know and if you have anything further please advise

## 2011-09-20 NOTE — Telephone Encounter (Signed)
That sounds perfect

## 2011-09-21 ENCOUNTER — Telehealth: Payer: Self-pay | Admitting: Internal Medicine

## 2011-09-21 DIAGNOSIS — R000 Tachycardia, unspecified: Secondary | ICD-10-CM | POA: Diagnosis not present

## 2011-09-21 DIAGNOSIS — K7689 Other specified diseases of liver: Secondary | ICD-10-CM | POA: Diagnosis not present

## 2011-09-21 DIAGNOSIS — R030 Elevated blood-pressure reading, without diagnosis of hypertension: Secondary | ICD-10-CM | POA: Diagnosis not present

## 2011-09-21 DIAGNOSIS — R232 Flushing: Secondary | ICD-10-CM | POA: Diagnosis not present

## 2011-09-21 DIAGNOSIS — R141 Gas pain: Secondary | ICD-10-CM | POA: Diagnosis not present

## 2011-09-21 DIAGNOSIS — Z79899 Other long term (current) drug therapy: Secondary | ICD-10-CM | POA: Diagnosis not present

## 2011-09-21 DIAGNOSIS — R109 Unspecified abdominal pain: Secondary | ICD-10-CM | POA: Diagnosis not present

## 2011-09-21 DIAGNOSIS — D1803 Hemangioma of intra-abdominal structures: Secondary | ICD-10-CM | POA: Diagnosis not present

## 2011-09-21 DIAGNOSIS — N949 Unspecified condition associated with female genital organs and menstrual cycle: Secondary | ICD-10-CM | POA: Diagnosis not present

## 2011-09-21 NOTE — Telephone Encounter (Signed)
Pt states heart is racing so she went to the ER.

## 2011-09-21 NOTE — Telephone Encounter (Signed)
BP was 160/80 this am and face is red.  I recommended that she stay in the ER.  She is requesting an appt with Dr Ladona Ridgel for further evaluation.

## 2011-09-21 NOTE — Telephone Encounter (Signed)
Pt calling to get a sooner appt with taylor than 10-16-11, bp "all over the place" saw dr Eppie Gibson Monday who started her on micardis, to take as needed since its high and low, heart racing at times, denies any other symptoms, pls advise

## 2011-09-21 NOTE — Telephone Encounter (Signed)
F/U  Patient returning nurse call, she is currently sitting in ER @ [facility] .  Please return call  At [phone]

## 2011-09-21 NOTE — Telephone Encounter (Signed)
N/A.  LMTC. 

## 2011-09-22 ENCOUNTER — Other Ambulatory Visit: Payer: Self-pay | Admitting: *Deleted

## 2011-09-22 ENCOUNTER — Telehealth: Payer: Self-pay | Admitting: *Deleted

## 2011-09-22 MED ORDER — TELMISARTAN 40 MG PO TABS: 40.0000 mg | ORAL_TABLET | Freq: Every day | ORAL | Status: DC

## 2011-09-22 NOTE — Telephone Encounter (Signed)
Pt calls and states went to ED yesterday afternoon with elevated heart rate, flushed and elevated BP. Was instructed to call you and see if you thought her meds needed to be increased. She states that when lying in bed in the am she is fine, it is when she gets up during the day and moving around that her heartrate will go between 90-130's, she gets flushed feeling and BP rises. WHile laying in bed this morning her BP is fine at 121/84 with HR of 68. Please advise

## 2011-09-22 NOTE — Telephone Encounter (Signed)
Pt informed

## 2011-09-22 NOTE — Telephone Encounter (Signed)
Ok to try a whole tab.

## 2011-09-25 ENCOUNTER — Telehealth: Payer: Self-pay | Admitting: *Deleted

## 2011-09-25 DIAGNOSIS — F339 Major depressive disorder, recurrent, unspecified: Secondary | ICD-10-CM | POA: Diagnosis not present

## 2011-09-25 NOTE — Telephone Encounter (Signed)
Pt states she is confused on when to take her BP meds. Pt states that her bp was good all weekend 117/80's & 112/78. States she didn't take her bp med. Today after her therapy appt she was 156/86 and when she got home it was 120/86. Please advise.

## 2011-09-25 NOTE — Telephone Encounter (Signed)
HOld BP med if SBP under 115/80. Sometimes acitivities like PT can raise BP but that can be normal.

## 2011-09-26 DIAGNOSIS — K7689 Other specified diseases of liver: Secondary | ICD-10-CM | POA: Diagnosis not present

## 2011-09-26 DIAGNOSIS — M259 Joint disorder, unspecified: Secondary | ICD-10-CM | POA: Diagnosis not present

## 2011-09-26 NOTE — Telephone Encounter (Signed)
Pt states will make appt

## 2011-09-28 DIAGNOSIS — K7689 Other specified diseases of liver: Secondary | ICD-10-CM | POA: Diagnosis not present

## 2011-09-28 DIAGNOSIS — F339 Major depressive disorder, recurrent, unspecified: Secondary | ICD-10-CM | POA: Diagnosis not present

## 2011-10-02 DIAGNOSIS — N938 Other specified abnormal uterine and vaginal bleeding: Secondary | ICD-10-CM | POA: Diagnosis not present

## 2011-10-02 DIAGNOSIS — N925 Other specified irregular menstruation: Secondary | ICD-10-CM | POA: Diagnosis not present

## 2011-10-02 DIAGNOSIS — Z79899 Other long term (current) drug therapy: Secondary | ICD-10-CM | POA: Diagnosis not present

## 2011-10-02 DIAGNOSIS — G894 Chronic pain syndrome: Secondary | ICD-10-CM | POA: Diagnosis not present

## 2011-10-02 DIAGNOSIS — R079 Chest pain, unspecified: Secondary | ICD-10-CM | POA: Diagnosis not present

## 2011-10-02 DIAGNOSIS — N949 Unspecified condition associated with female genital organs and menstrual cycle: Secondary | ICD-10-CM | POA: Diagnosis not present

## 2011-10-02 DIAGNOSIS — R0789 Other chest pain: Secondary | ICD-10-CM | POA: Diagnosis not present

## 2011-10-02 DIAGNOSIS — J9819 Other pulmonary collapse: Secondary | ICD-10-CM | POA: Diagnosis not present

## 2011-10-03 ENCOUNTER — Ambulatory Visit: Payer: Medicare Other | Admitting: Neurology

## 2011-10-03 ENCOUNTER — Ambulatory Visit: Payer: Medicare Other | Admitting: Family Medicine

## 2011-10-03 DIAGNOSIS — R0789 Other chest pain: Secondary | ICD-10-CM | POA: Diagnosis not present

## 2011-10-03 DIAGNOSIS — K2270 Barrett's esophagus without dysplasia: Secondary | ICD-10-CM | POA: Diagnosis not present

## 2011-10-03 DIAGNOSIS — Z9109 Other allergy status, other than to drugs and biological substances: Secondary | ICD-10-CM | POA: Diagnosis not present

## 2011-10-03 DIAGNOSIS — G4733 Obstructive sleep apnea (adult) (pediatric): Secondary | ICD-10-CM | POA: Diagnosis not present

## 2011-10-03 DIAGNOSIS — Z9089 Acquired absence of other organs: Secondary | ICD-10-CM | POA: Diagnosis not present

## 2011-10-03 DIAGNOSIS — F3289 Other specified depressive episodes: Secondary | ICD-10-CM | POA: Diagnosis not present

## 2011-10-03 DIAGNOSIS — R079 Chest pain, unspecified: Secondary | ICD-10-CM | POA: Diagnosis not present

## 2011-10-03 DIAGNOSIS — Z888 Allergy status to other drugs, medicaments and biological substances status: Secondary | ICD-10-CM | POA: Diagnosis not present

## 2011-10-03 DIAGNOSIS — Z886 Allergy status to analgesic agent status: Secondary | ICD-10-CM | POA: Diagnosis not present

## 2011-10-03 DIAGNOSIS — K219 Gastro-esophageal reflux disease without esophagitis: Secondary | ICD-10-CM | POA: Diagnosis not present

## 2011-10-03 DIAGNOSIS — J9819 Other pulmonary collapse: Secondary | ICD-10-CM | POA: Diagnosis not present

## 2011-10-03 DIAGNOSIS — F329 Major depressive disorder, single episode, unspecified: Secondary | ICD-10-CM | POA: Diagnosis not present

## 2011-10-03 DIAGNOSIS — F411 Generalized anxiety disorder: Secondary | ICD-10-CM | POA: Diagnosis not present

## 2011-10-03 DIAGNOSIS — Z862 Personal history of diseases of the blood and blood-forming organs and certain disorders involving the immune mechanism: Secondary | ICD-10-CM | POA: Diagnosis not present

## 2011-10-03 DIAGNOSIS — Z8742 Personal history of other diseases of the female genital tract: Secondary | ICD-10-CM | POA: Diagnosis not present

## 2011-10-03 DIAGNOSIS — Z882 Allergy status to sulfonamides status: Secondary | ICD-10-CM | POA: Diagnosis not present

## 2011-10-03 DIAGNOSIS — J45909 Unspecified asthma, uncomplicated: Secondary | ICD-10-CM | POA: Diagnosis not present

## 2011-10-03 DIAGNOSIS — Z880 Allergy status to penicillin: Secondary | ICD-10-CM | POA: Diagnosis not present

## 2011-10-04 DIAGNOSIS — F339 Major depressive disorder, recurrent, unspecified: Secondary | ICD-10-CM | POA: Diagnosis not present

## 2011-10-06 ENCOUNTER — Ambulatory Visit: Payer: Medicare Other | Admitting: Family Medicine

## 2011-10-06 DIAGNOSIS — R51 Headache: Secondary | ICD-10-CM | POA: Diagnosis not present

## 2011-10-06 DIAGNOSIS — Z79899 Other long term (current) drug therapy: Secondary | ICD-10-CM | POA: Diagnosis not present

## 2011-10-06 DIAGNOSIS — R42 Dizziness and giddiness: Secondary | ICD-10-CM | POA: Diagnosis not present

## 2011-10-06 DIAGNOSIS — R55 Syncope and collapse: Secondary | ICD-10-CM | POA: Diagnosis not present

## 2011-10-06 DIAGNOSIS — R110 Nausea: Secondary | ICD-10-CM | POA: Diagnosis not present

## 2011-10-06 DIAGNOSIS — G894 Chronic pain syndrome: Secondary | ICD-10-CM | POA: Diagnosis not present

## 2011-10-06 DIAGNOSIS — R1013 Epigastric pain: Secondary | ICD-10-CM | POA: Diagnosis not present

## 2011-10-07 ENCOUNTER — Emergency Department (HOSPITAL_BASED_OUTPATIENT_CLINIC_OR_DEPARTMENT_OTHER)
Admission: EM | Admit: 2011-10-07 | Discharge: 2011-10-07 | Disposition: A | Discharge status: Home / Self Care | Payer: Medicare Other | Attending: Emergency Medicine | Admitting: Emergency Medicine

## 2011-10-07 ENCOUNTER — Emergency Department (INDEPENDENT_AMBULATORY_CARE_PROVIDER_SITE_OTHER): Payer: Medicare Other

## 2011-10-07 ENCOUNTER — Encounter (HOSPITAL_BASED_OUTPATIENT_CLINIC_OR_DEPARTMENT_OTHER): Payer: Self-pay | Admitting: *Deleted

## 2011-10-07 ENCOUNTER — Other Ambulatory Visit: Payer: Self-pay

## 2011-10-07 DIAGNOSIS — Z79899 Other long term (current) drug therapy: Secondary | ICD-10-CM | POA: Diagnosis not present

## 2011-10-07 DIAGNOSIS — J45909 Unspecified asthma, uncomplicated: Secondary | ICD-10-CM | POA: Insufficient documentation

## 2011-10-07 DIAGNOSIS — E785 Hyperlipidemia, unspecified: Secondary | ICD-10-CM | POA: Insufficient documentation

## 2011-10-07 DIAGNOSIS — I10 Essential (primary) hypertension: Secondary | ICD-10-CM | POA: Insufficient documentation

## 2011-10-07 DIAGNOSIS — R51 Headache: Secondary | ICD-10-CM

## 2011-10-07 DIAGNOSIS — R42 Dizziness and giddiness: Secondary | ICD-10-CM | POA: Diagnosis not present

## 2011-10-07 DIAGNOSIS — K219 Gastro-esophageal reflux disease without esophagitis: Secondary | ICD-10-CM | POA: Diagnosis not present

## 2011-10-07 DIAGNOSIS — R110 Nausea: Secondary | ICD-10-CM

## 2011-10-07 DIAGNOSIS — R209 Unspecified disturbances of skin sensation: Secondary | ICD-10-CM | POA: Diagnosis not present

## 2011-10-07 DIAGNOSIS — H532 Diplopia: Secondary | ICD-10-CM

## 2011-10-07 LAB — BASIC METABOLIC PANEL
BUN: 13 mg/dL (ref 6–23)
CO2: 24 mEq/L (ref 19–32)
Calcium: 9.4 mg/dL (ref 8.4–10.5)
Chloride: 106 mEq/L (ref 96–112)
Creatinine, Ser: 0.6 mg/dL (ref 0.50–1.10)
GFR calc Af Amer: >90 mL/min (ref >90)
GFR calc non Af Amer: >90 mL/min (ref >90)
Glucose, Bld: 100 mg/dL — ABNORMAL HIGH (ref 70–99)
Potassium: 3.6 mEq/L (ref 3.5–5.1)
Sodium: 141 mEq/L (ref 135–145)

## 2011-10-07 LAB — URINALYSIS, ROUTINE W REFLEX MICROSCOPIC
Bilirubin Urine: NEGATIVE
Glucose, UA: NEGATIVE mg/dL
Hgb urine dipstick: NEGATIVE
Ketones, ur: NEGATIVE mg/dL
Leukocytes, UA: NEGATIVE
Nitrite: NEGATIVE
Protein, ur: NEGATIVE mg/dL
Specific Gravity, Urine: 1.022 (ref 1.005–1.030)
Urobilinogen, UA: 0.2 mg/dL (ref 0.0–1.0)
pH: 7 (ref 5.0–8.0)

## 2011-10-07 LAB — CBC
HCT: 40.5 % (ref 36.0–46.0)
Hemoglobin: 13.8 g/dL (ref 12.0–15.0)
MCH: 29.7 pg (ref 26.0–34.0)
MCHC: 34.1 g/dL (ref 30.0–36.0)
MCV: 87.3 fL (ref 78.0–100.0)
Platelets: 218 10*3/uL (ref 150–400)
RBC: 4.64 MIL/uL (ref 3.87–5.11)
RDW: 13 % (ref 11.5–15.5)
WBC: 4.9 10*3/uL (ref 4.0–10.5)

## 2011-10-07 LAB — PREGNANCY, URINE: Preg Test, Ur: NEGATIVE

## 2011-10-07 LAB — SEDIMENTATION RATE: Sed Rate: 22 mm/hr (ref 0–22)

## 2011-10-07 MED ORDER — ONDANSETRON HCL 4 MG/2ML IJ SOLN
4.0000 mg | Freq: Once | INTRAMUSCULAR | Status: AC
  Administered 2011-10-07: 4 mg via INTRAVENOUS
  Filled 2011-10-07: qty 2

## 2011-10-07 MED ORDER — ACETAMINOPHEN 500 MG PO TABS: 1000.0000 mg | ORAL_TABLET | Freq: Once | ORAL | Status: DC

## 2011-10-07 MED ORDER — SODIUM CHLORIDE 0.9 % IV BOLUS (SEPSIS)
1000.0000 mL | Freq: Once | INTRAVENOUS | Status: AC
  Administered 2011-10-07: 1000 mL via INTRAVENOUS

## 2011-10-07 MED ORDER — MECLIZINE HCL 50 MG PO TABS: 50.0000 mg | ORAL_TABLET | Freq: Three times a day (TID) | ORAL | Status: DC | PRN

## 2011-10-07 MED ORDER — KETOROLAC TROMETHAMINE 30 MG/ML IJ SOLN
30.0000 mg | Freq: Once | INTRAMUSCULAR | Status: DC
  Filled 2011-10-07: qty 1

## 2011-10-07 MED ORDER — PROMETHAZINE HCL 25 MG/ML IJ SOLN
25.0000 mg | Freq: Once | INTRAMUSCULAR | Status: DC
  Filled 2011-10-07: qty 1

## 2011-10-07 MED ORDER — DIPHENHYDRAMINE HCL 50 MG/ML IJ SOLN
25.0000 mg | Freq: Once | INTRAMUSCULAR | Status: DC
  Filled 2011-10-07: qty 1
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
## 2011-10-07 NOTE — ED Notes (Signed)
Patient insisted on taking her own tylenol chewable tabs, took 960mg , MD informed

## 2011-10-07 NOTE — Discharge Instructions (Signed)
Headache, General, Unknown Cause The specific cause of your headache may not have been found today. There are many causes and types of headache. A few common ones are:  Tension headache.   Migraine.   Infections (examples: dental and sinus infections).   Bone and/or joint problems in the neck or jaw.   Depression.   Eye problems.  These headaches are not life threatening.  Headaches can sometimes be diagnosed by a patient history and a physical exam. Sometimes, lab and imaging studies (such as x-ray and/or CT scan) are used to rule out more serious problems. In some cases, a spinal tap (lumbar puncture) may be requested. There are many times when your exam and tests may be normal on the first visit even when there is a serious problem causing your headaches. Because of that, it is very important to follow up with your doctor or local clinic for further evaluation. FINDING OUT THE RESULTS OF TESTS  If a radiology test was performed, a radiologist will review your results.   You will be contacted by the emergency department or your physician if any test results require a change in your treatment plan.   Not all test results may be available during your visit. If your test results are not back during the visit, make an appointment with your caregiver to find out the results. Do not assume everything is normal if you have not heard from your caregiver or the medical facility. It is important for you to follow up on all of your test results.  HOME CARE INSTRUCTIONS   Keep follow-up appointments with your caregiver, or any specialist referral.   Only take over-the-counter or prescription medicines for pain, discomfort, or fever as directed by your caregiver.   Biofeedback, massage, or other relaxation techniques may be helpful.   Ice packs or heat applied to the head and neck can be used. Do this three to four times per day, or as needed.   Call your doctor if you have any questions or  concerns.   If you smoke, you should quit.  SEEK MEDICAL CARE IF:   You develop problems with medications prescribed.   You do not respond to or obtain relief from medications.   You have a change from the usual headache.   You develop nausea or vomiting.  SEEK IMMEDIATE MEDICAL CARE IF:   If your headache becomes severe.   You have an unexplained oral temperature above 102 F (38.9 C), or as your caregiver suggests.   You have a stiff neck.   You have loss of vision.   You have muscular weakness.   You have loss of muscular control.   You develop severe symptoms different from your first symptoms.   You start losing your balance or have trouble walking.   You feel faint or pass out.  MAKE SURE YOU:   Understand these instructions.   Will watch your condition.   Will get help right away if you are not doing well or get worse.  Document Released: 07/31/2005 Document Revised: 04/12/2011 Document Reviewed: 03/19/2008 [employer] Patient Information 2012 [employer]. 

## 2011-10-10 ENCOUNTER — Ambulatory Visit: Payer: Medicare Other | Admitting: Family Medicine

## 2011-10-11 ENCOUNTER — Ambulatory Visit: Payer: Medicare Other | Admitting: Family Medicine

## 2011-10-12 ENCOUNTER — Ambulatory Visit: Payer: Medicare Other | Admitting: Family Medicine

## 2011-10-12 ENCOUNTER — Ambulatory Visit: Payer: Medicare Other | Admitting: Critical Care Medicine

## 2011-10-13 IMAGING — CR DG CHEST 2V
2 series · 2 of 2 positions shown · non-contrast
Comparison: 05/20/2010

CLINICAL DATA: Cough, history of asthma, shortness of breath and
chest pain.

CHEST - 2 VIEW

[w chest pa]
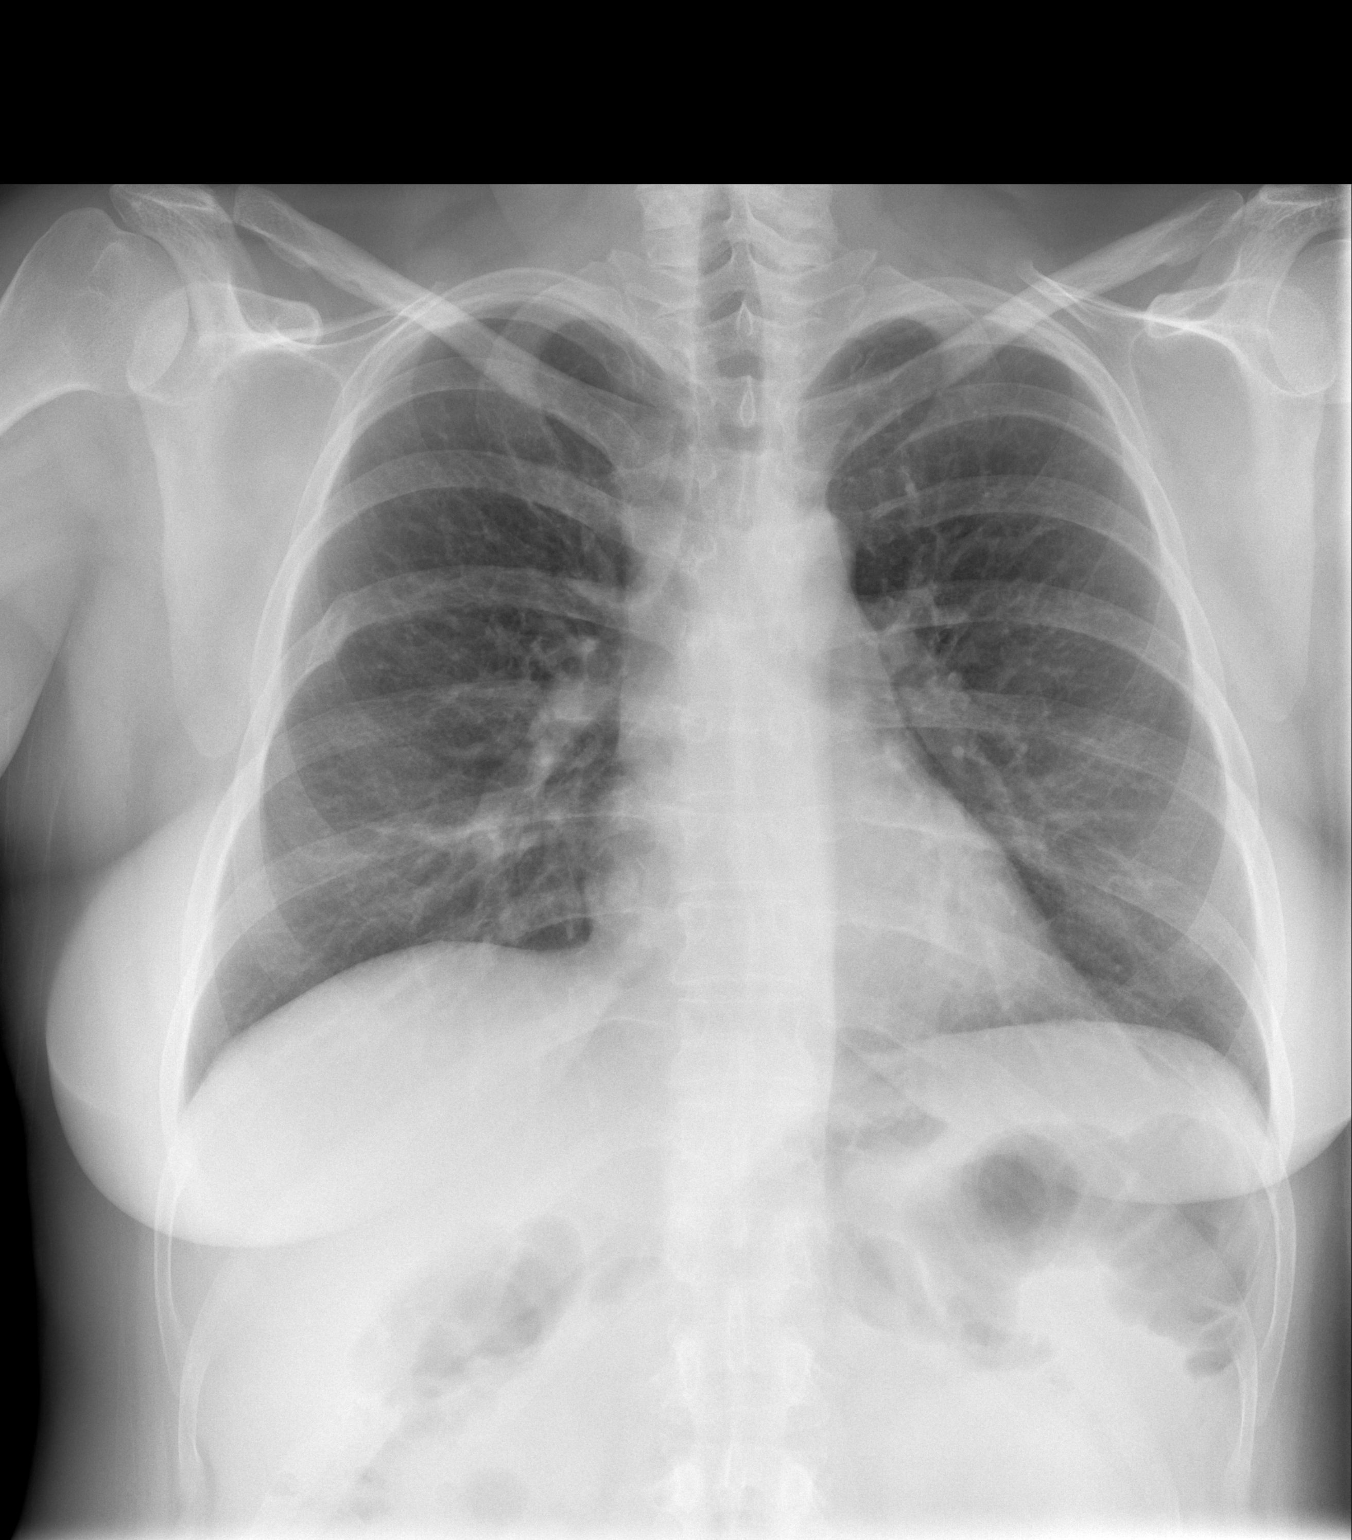

[w chest lat]
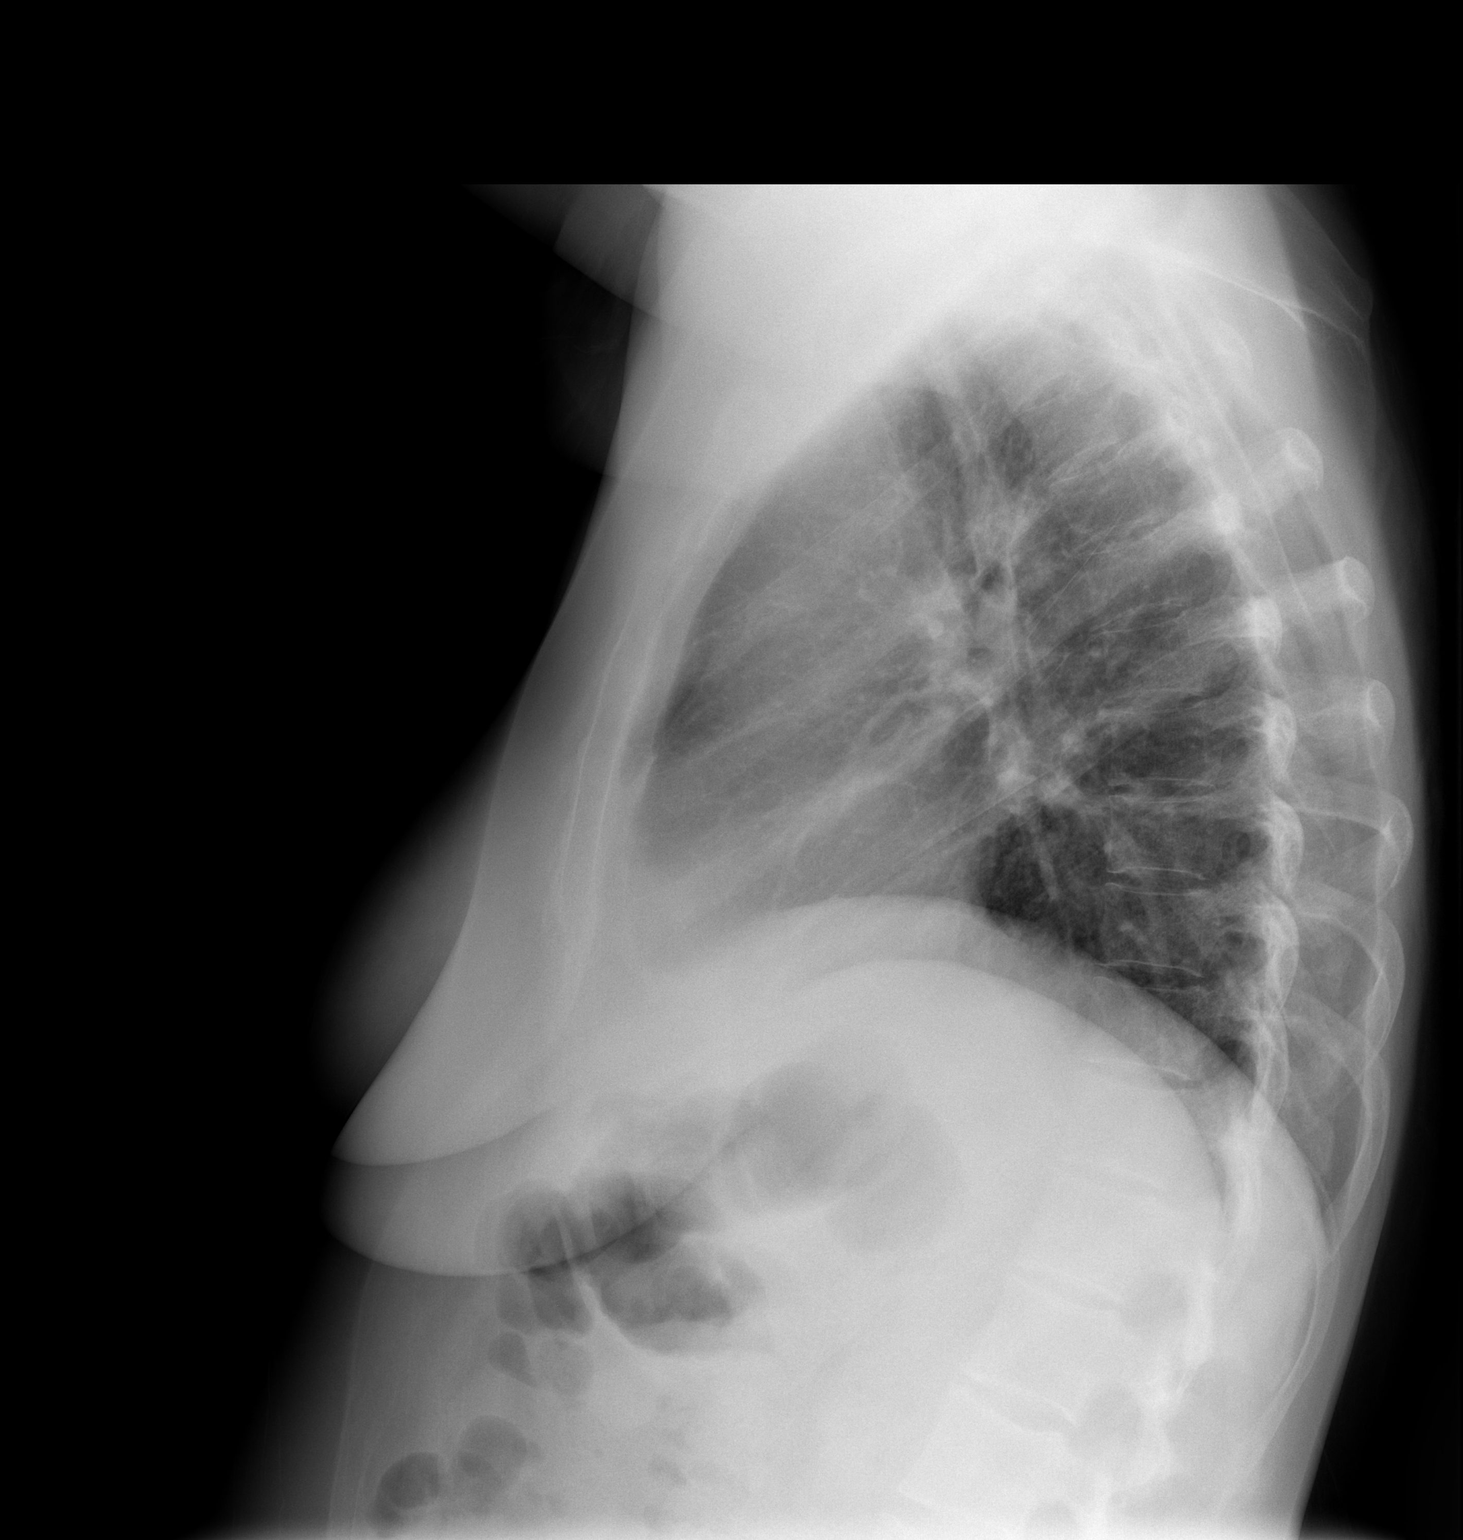

[2 of 2 positions shown; findings below may reference images not displayed]

FINDINGS: Stable mild bronchial and interstitial prominence in the
lungs.  No evidence of focal infiltrate, edema, pneumothorax,
nodule or pleural fluid.  Heart size is normal.  Stable healed
sixth rib fracture.
IMPRESSION: No active disease.  Stable chronic changes.

## 2011-10-14 DIAGNOSIS — R0609 Other forms of dyspnea: Secondary | ICD-10-CM | POA: Diagnosis not present

## 2011-10-14 DIAGNOSIS — R112 Nausea with vomiting, unspecified: Secondary | ICD-10-CM | POA: Diagnosis not present

## 2011-10-14 DIAGNOSIS — R079 Chest pain, unspecified: Secondary | ICD-10-CM | POA: Diagnosis not present

## 2011-10-14 DIAGNOSIS — Z79899 Other long term (current) drug therapy: Secondary | ICD-10-CM | POA: Diagnosis not present

## 2011-10-14 DIAGNOSIS — R002 Palpitations: Secondary | ICD-10-CM | POA: Diagnosis not present

## 2011-10-14 DIAGNOSIS — R0989 Other specified symptoms and signs involving the circulatory and respiratory systems: Secondary | ICD-10-CM | POA: Diagnosis not present

## 2011-10-14 DIAGNOSIS — F411 Generalized anxiety disorder: Secondary | ICD-10-CM | POA: Diagnosis not present

## 2011-10-16 ENCOUNTER — Encounter: Payer: Self-pay | Admitting: Internal Medicine

## 2011-10-16 ENCOUNTER — Ambulatory Visit (INDEPENDENT_AMBULATORY_CARE_PROVIDER_SITE_OTHER): Payer: Medicare Other | Admitting: Internal Medicine

## 2011-10-16 DIAGNOSIS — R079 Chest pain, unspecified: Secondary | ICD-10-CM

## 2011-10-16 DIAGNOSIS — R61 Generalized hyperhidrosis: Secondary | ICD-10-CM | POA: Diagnosis not present

## 2011-10-16 DIAGNOSIS — I498 Other specified cardiac arrhythmias: Secondary | ICD-10-CM

## 2011-10-16 DIAGNOSIS — E785 Hyperlipidemia, unspecified: Secondary | ICD-10-CM | POA: Diagnosis not present

## 2011-10-16 DIAGNOSIS — R0789 Other chest pain: Secondary | ICD-10-CM | POA: Diagnosis not present

## 2011-10-16 DIAGNOSIS — J45909 Unspecified asthma, uncomplicated: Secondary | ICD-10-CM | POA: Diagnosis not present

## 2011-10-16 DIAGNOSIS — I10 Essential (primary) hypertension: Secondary | ICD-10-CM | POA: Diagnosis not present

## 2011-10-16 DIAGNOSIS — R110 Nausea: Secondary | ICD-10-CM | POA: Diagnosis not present

## 2011-10-16 DIAGNOSIS — Z79899 Other long term (current) drug therapy: Secondary | ICD-10-CM | POA: Diagnosis not present

## 2011-10-16 DIAGNOSIS — D509 Iron deficiency anemia, unspecified: Secondary | ICD-10-CM | POA: Diagnosis not present

## 2011-10-16 DIAGNOSIS — R0602 Shortness of breath: Secondary | ICD-10-CM | POA: Diagnosis not present

## 2011-10-16 DIAGNOSIS — R928 Other abnormal and inconclusive findings on diagnostic imaging of breast: Secondary | ICD-10-CM | POA: Diagnosis not present

## 2011-10-16 DIAGNOSIS — K219 Gastro-esophageal reflux disease without esophagitis: Secondary | ICD-10-CM | POA: Diagnosis not present

## 2011-10-16 DIAGNOSIS — F411 Generalized anxiety disorder: Secondary | ICD-10-CM | POA: Diagnosis not present

## 2011-10-16 NOTE — Patient Instructions (Signed)
Your physician has requested that you have an exercise tolerance test. For further information please visit [email or web address]. Please also follow instruction sheet, as given.   

## 2011-10-16 NOTE — Assessment & Plan Note (Signed)
This is a reoccurrence of an old problem. Previously, she was experienced these symptoms in the absence of exertion. Now, her symptoms appear to be related to exertion. I've recommended that she undergo an exercise treadmill test to better evaluate her symptoms. No change in medications at this point.
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
## 2011-10-16 NOTE — Assessment & Plan Note (Signed)
She has had no recurrent documented atrial arrhythmias. She does have baseline sinus tachycardia.

## 2011-10-17 DIAGNOSIS — R079 Chest pain, unspecified: Secondary | ICD-10-CM | POA: Diagnosis not present

## 2011-10-17 DIAGNOSIS — E349 Endocrine disorder, unspecified: Secondary | ICD-10-CM | POA: Diagnosis not present

## 2011-10-18 ENCOUNTER — Encounter: Payer: Medicare Other | Admitting: Physician Assistant

## 2011-10-18 ENCOUNTER — Telehealth: Payer: Self-pay | Admitting: *Deleted

## 2011-10-18 DIAGNOSIS — F339 Major depressive disorder, recurrent, unspecified: Secondary | ICD-10-CM | POA: Diagnosis not present

## 2011-10-18 MED ORDER — CANE MISC: Status: DC
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
## 2011-10-19 DIAGNOSIS — R5383 Other fatigue: Secondary | ICD-10-CM | POA: Diagnosis not present

## 2011-10-19 DIAGNOSIS — E876 Hypokalemia: Secondary | ICD-10-CM | POA: Diagnosis not present

## 2011-10-19 DIAGNOSIS — R5381 Other malaise: Secondary | ICD-10-CM | POA: Diagnosis not present

## 2011-10-20 DIAGNOSIS — R5383 Other fatigue: Secondary | ICD-10-CM | POA: Diagnosis not present

## 2011-10-20 DIAGNOSIS — R5381 Other malaise: Secondary | ICD-10-CM | POA: Diagnosis not present

## 2011-10-23 ENCOUNTER — Encounter (HOSPITAL_BASED_OUTPATIENT_CLINIC_OR_DEPARTMENT_OTHER): Payer: Self-pay

## 2011-10-23 ENCOUNTER — Telehealth: Payer: Self-pay | Admitting: Critical Care Medicine

## 2011-10-23 ENCOUNTER — Emergency Department (HOSPITAL_BASED_OUTPATIENT_CLINIC_OR_DEPARTMENT_OTHER)
Admission: EM | Admit: 2011-10-23 | Discharge: 2011-10-23 | Disposition: A | Discharge status: Home / Self Care | Payer: Medicare Other | Attending: Emergency Medicine | Admitting: Emergency Medicine

## 2011-10-23 ENCOUNTER — Emergency Department (INDEPENDENT_AMBULATORY_CARE_PROVIDER_SITE_OTHER): Payer: Medicare Other

## 2011-10-23 DIAGNOSIS — Z801 Family history of malignant neoplasm of trachea, bronchus and lung: Secondary | ICD-10-CM | POA: Insufficient documentation

## 2011-10-23 DIAGNOSIS — I10 Essential (primary) hypertension: Secondary | ICD-10-CM | POA: Insufficient documentation

## 2011-10-23 DIAGNOSIS — J45909 Unspecified asthma, uncomplicated: Secondary | ICD-10-CM | POA: Insufficient documentation

## 2011-10-23 DIAGNOSIS — J329 Chronic sinusitis, unspecified: Secondary | ICD-10-CM | POA: Diagnosis not present

## 2011-10-23 DIAGNOSIS — M79609 Pain in unspecified limb: Secondary | ICD-10-CM | POA: Diagnosis not present

## 2011-10-23 DIAGNOSIS — F4310 Post-traumatic stress disorder, unspecified: Secondary | ICD-10-CM | POA: Insufficient documentation

## 2011-10-23 DIAGNOSIS — K219 Gastro-esophageal reflux disease without esophagitis: Secondary | ICD-10-CM | POA: Diagnosis not present

## 2011-10-23 DIAGNOSIS — I498 Other specified cardiac arrhythmias: Secondary | ICD-10-CM | POA: Diagnosis not present

## 2011-10-23 DIAGNOSIS — M5090 Cervical disc disorder, unspecified, unspecified cervical region: Secondary | ICD-10-CM | POA: Insufficient documentation

## 2011-10-23 DIAGNOSIS — Z8544 Personal history of malignant neoplasm of other female genital organs: Secondary | ICD-10-CM | POA: Insufficient documentation

## 2011-10-23 DIAGNOSIS — R51 Headache: Secondary | ICD-10-CM | POA: Diagnosis not present

## 2011-10-23 DIAGNOSIS — G8929 Other chronic pain: Secondary | ICD-10-CM | POA: Diagnosis not present

## 2011-10-23 DIAGNOSIS — J0100 Acute maxillary sinusitis, unspecified: Secondary | ICD-10-CM | POA: Diagnosis not present

## 2011-10-23 DIAGNOSIS — Z808 Family history of malignant neoplasm of other organs or systems: Secondary | ICD-10-CM | POA: Insufficient documentation

## 2011-10-23 DIAGNOSIS — M79604 Pain in right leg: Secondary | ICD-10-CM

## 2011-10-23 DIAGNOSIS — Z9889 Other specified postprocedural states: Secondary | ICD-10-CM | POA: Insufficient documentation

## 2011-10-23 DIAGNOSIS — J0110 Acute frontal sinusitis, unspecified: Secondary | ICD-10-CM | POA: Diagnosis not present

## 2011-10-23 MED ORDER — DOXYCYCLINE CALCIUM 50 MG/5ML PO SYRP: 100.0000 mg | ORAL_SOLUTION | Freq: Two times a day (BID) | ORAL | Status: DC

## 2011-10-23 NOTE — ED Provider Notes (Signed)
Medical screening examination/treatment/procedure(s) were conducted as a shared visit with non-physician practitioner(s) and myself.  I personally evaluated the patient during the encounter  [facility] today we'll get a Doppler study to rule out deep vein thrombosis past history of lymphatic groin swelling could be related to that.    Shelda Jakes, MD 10/23/11 [phone]

## 2011-10-23 NOTE — ED Notes (Signed)
C/o sinus congestion, coughing x 2 weeks-right leg/groin pain x 2 days-hx of same pain-denies injury

## 2011-10-23 NOTE — Telephone Encounter (Signed)
Pt c/o sinus pain and pressure x 2 days. Her face hurts really bad. Pt says she is coughing up light green mucus now and her chest hurts. She says she is headed to the [facility] ED to be seen. She could not get in to see her PCP and only wants to be seen by PW at this office. Offered an appt with TP in the morning but pt has a prior obligation and could not come for OV. Will send as an FYI to PW so he is aware.

## 2011-10-23 NOTE — Discharge Instructions (Signed)
Sinusitis Sinuses are air pockets within the bones of your face. The growth of bacteria within a sinus leads to infection. The infection prevents the sinuses from draining. This infection is called sinusitis. SYMPTOMS  There will be different areas of pain depending on which sinuses have become infected.  The maxillary sinuses often produce pain beneath the eyes.   Frontal sinusitis may cause pain in the middle of the forehead and above the eyes.  Other problems (symptoms) include:  Toothaches.   Colored, pus-like (purulent) drainage from the nose.   Swelling, warmth, and tenderness over the sinus areas may be signs of infection.  TREATMENT  Sinusitis is most often determined by an exam.X-rays may be taken. If x-rays have been taken, make sure you obtain your results or find out how you are to obtain them. Your caregiver may give you medications (antibiotics). These are medications that will help kill the bacteria causing the infection. You may also be given a medication (decongestant) that helps to reduce sinus swelling.  HOME CARE INSTRUCTIONS   Only take over-the-counter or prescription medicines for pain, discomfort, or fever as directed by your caregiver.   Drink extra fluids. Fluids help thin the mucus so your sinuses can drain more easily.   Applying either moist heat or ice packs to the sinus areas may help relieve discomfort.   Use saline nasal sprays to help moisten your sinuses. The sprays can be found at your local drugstore.  SEEK IMMEDIATE MEDICAL CARE IF:  You have a fever.   You have increasing pain, severe headaches, or toothache.   You have nausea, vomiting, or drowsiness.   You develop unusual swelling around the face or trouble seeing.  MAKE SURE YOU:   Understand these instructions.   Will watch your condition.   Will get help right away if you are not doing well or get worse.  Document Released: 07/31/2005 Document Revised: 07/20/2011 Document Reviewed:  02/27/2007 Exit[employer] Patient Information 2012 [employer]. 

## 2011-10-23 NOTE — Telephone Encounter (Signed)
Noted  
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
## 2011-10-24 ENCOUNTER — Telehealth: Payer: Self-pay | Admitting: *Deleted

## 2011-10-24 ENCOUNTER — Encounter: Payer: Self-pay | Admitting: Family Medicine

## 2011-10-24 ENCOUNTER — Ambulatory Visit (INDEPENDENT_AMBULATORY_CARE_PROVIDER_SITE_OTHER): Payer: Medicare Other | Admitting: Family Medicine

## 2011-10-24 VITALS — BP 121/82 | HR 99 | Wt 180.0 lb

## 2011-10-24 DIAGNOSIS — J329 Chronic sinusitis, unspecified: Secondary | ICD-10-CM

## 2011-10-24 DIAGNOSIS — R928 Other abnormal and inconclusive findings on diagnostic imaging of breast: Secondary | ICD-10-CM | POA: Diagnosis not present

## 2011-10-24 DIAGNOSIS — N63 Unspecified lump in unspecified breast: Secondary | ICD-10-CM

## 2011-10-24 DIAGNOSIS — F339 Major depressive disorder, recurrent, unspecified: Secondary | ICD-10-CM | POA: Diagnosis not present

## 2011-10-24 MED ORDER — NYSTATIN 100000 UNIT/ML MT SUSP: 500000.0000 [IU] | Freq: Four times a day (QID) | OROMUCOSAL | Status: DC

## 2011-10-24 MED ORDER — FLUCONAZOLE 40 MG/ML PO SUSR: 150.0000 mg | Freq: Every day | ORAL | Status: DC

## 2011-10-24 NOTE — Progress Notes (Signed)
Subjective:    Patient ID: Jennifer [name], female    DOB: [birth year], 46 y.o.   MRN: [id number]  HPI Dx with sinusitis and given doxy in liquid form but her insurance won't cover it. Thinks she may also have thrus in her mouth. Having a lot of pain, swelling and congestion in her sinuses and now they are bloody and says having ST.  She is feeling nauseated and says her stomach is upset.  Has been a little more SOB. Thought has a stress test scheduled with Dr. Ladona Ridgel. Went to [facility] ED recently for SOB and they did another chest CT with + dimer (but has chronic elevated D-dimer).    Says they noticed a nodule on her breast on the CT. Had a normal mammo in November.  Nodule was 13mm left breast.  She is very worried about this. She has no prior history of breast cancer. She said she had a biopsy when she was a teenager but nothing since then. She had her last mammogram done in [place].  She is also still concerned about her intermittent episodes of tachycardia. She says that she can occasionally get a Coke or a chocolate does not aggravate it. She also says that when she does stay away from these types of foods it tends to happen. Review of Systems     Objective:   Physical Exam  Constitutional: She is oriented to person, place, and time. She appears well-developed and well-nourished.  HENT:  Head: Normocephalic and atraumatic.  Right Ear: External ear normal.  Left Ear: External ear normal.  Nose: Nose normal.  Mouth/Throat: Oropharynx is clear and moist.       TMs and canals are clear. Dried blood in the right nostril. Small amount of white coating on tongue. No surrounding erythema.   Eyes: Conjunctivae and EOM are normal. Pupils are equal, round, and reactive to light.  Neck: Neck supple. No thyromegaly present.  Cardiovascular: Normal rate, regular rhythm and normal heart sounds.   Pulmonary/Chest: Effort normal and breath sounds normal. She has no wheezes.  Lymphadenopathy:   She has no cervical adenopathy.  Neurological: She is alert and oriented to person, place, and time.  Skin: Skin is warm and dry.  Psychiatric: She has a normal mood and affect.          Assessment & Plan:  Sinusitis - Discussed we need to treat this and willing to call in something.  Running a humidifer at night.  I think she really need to treat this infection to get better. Says she may try some patanase samples that the ENT has offered.  I also explained her that the postnasal drip and drainage from a sinus infection can also make her stomach more upset.  Breast nocdule on CT.  Will call [place] and see what they recommend for further eval of the nodule on her left breast to see needs further workup.  Performed at [facility].  Had imaging done at [facility].    Thrush - Maybe early thrush. Will go ahead and treat with nystatin swish adn swallow. Call if not improving.   I reviewed with her again the tachycardia can be triggered by caffeine intake but has not really caused by. Does eating or not eating caffeine tends to cause her symptoms. But eating and drinking caffeine can certainly cause it to happen more often but doesn't necessarily have to happen each time she intake the caffeine. It sounds like she does have a  stress test scheduled through our cardiologist sometime soon.

## 2011-10-24 NOTE — ED Provider Notes (Signed)
Medical screening examination/treatment/procedure(s) were conducted as a shared visit with non-physician practitioner(s) and myself.  I personally evaluated the patient during the encounter  Shelda Jakes, MD 10/24/11 2249

## 2011-10-24 NOTE — Telephone Encounter (Signed)
Ok rx sent. Please call [facility] to cancel. i accidenlty sent it there before I sent it to [facility].

## 2011-10-24 NOTE — Patient Instructions (Signed)
Need to start antibiotic for your sinusitis.  She says she already has something called in.

## 2011-10-24 NOTE — Telephone Encounter (Signed)
Pt called and states she took fluconazole 40mg  /ml last time and this works for her and she wants this called into to [facility] on [address] in [place]

## 2011-10-25 ENCOUNTER — Telehealth: Payer: Self-pay | Admitting: Family Medicine

## 2011-10-25 DIAGNOSIS — R070 Pain in throat: Secondary | ICD-10-CM | POA: Diagnosis not present

## 2011-10-25 DIAGNOSIS — N63 Unspecified lump in unspecified breast: Secondary | ICD-10-CM

## 2011-10-25 DIAGNOSIS — J029 Acute pharyngitis, unspecified: Secondary | ICD-10-CM | POA: Diagnosis not present

## 2011-10-25 NOTE — Telephone Encounter (Signed)
Please call mammogram Center at [facility]. She gets her mammograms done there. She had a normal mammogram back in November. She had a breast nodule noted on the left, approximately 13 mm chest CT she had recently. Please find out if they recommend further evaluation with a diagnostic mammogram of the left or possible ultrasound.

## 2011-10-26 ENCOUNTER — Encounter: Payer: Self-pay | Admitting: Critical Care Medicine

## 2011-10-26 ENCOUNTER — Telehealth: Payer: Self-pay | Admitting: Critical Care Medicine

## 2011-10-26 ENCOUNTER — Ambulatory Visit (INDEPENDENT_AMBULATORY_CARE_PROVIDER_SITE_OTHER): Payer: Medicare Other | Admitting: Critical Care Medicine

## 2011-10-26 VITALS — BP 132/90 | HR 114 | Temp 98.2°F | Ht 62.0 in | Wt 183.0 lb

## 2011-10-26 DIAGNOSIS — Z9889 Other specified postprocedural states: Secondary | ICD-10-CM | POA: Diagnosis not present

## 2011-10-26 DIAGNOSIS — I471 Supraventricular tachycardia: Secondary | ICD-10-CM | POA: Diagnosis not present

## 2011-10-26 DIAGNOSIS — F411 Generalized anxiety disorder: Secondary | ICD-10-CM | POA: Diagnosis not present

## 2011-10-26 DIAGNOSIS — K2289 Other specified disease of esophagus: Secondary | ICD-10-CM | POA: Diagnosis not present

## 2011-10-26 DIAGNOSIS — J3489 Other specified disorders of nose and nasal sinuses: Secondary | ICD-10-CM | POA: Diagnosis not present

## 2011-10-26 DIAGNOSIS — K219 Gastro-esophageal reflux disease without esophagitis: Secondary | ICD-10-CM | POA: Diagnosis not present

## 2011-10-26 DIAGNOSIS — J383 Other diseases of vocal cords: Secondary | ICD-10-CM | POA: Diagnosis not present

## 2011-10-26 DIAGNOSIS — J328 Other chronic sinusitis: Secondary | ICD-10-CM | POA: Diagnosis not present

## 2011-10-26 DIAGNOSIS — Z79899 Other long term (current) drug therapy: Secondary | ICD-10-CM | POA: Diagnosis not present

## 2011-10-26 DIAGNOSIS — R059 Cough, unspecified: Secondary | ICD-10-CM | POA: Diagnosis not present

## 2011-10-26 DIAGNOSIS — D509 Iron deficiency anemia, unspecified: Secondary | ICD-10-CM | POA: Diagnosis not present

## 2011-10-26 DIAGNOSIS — J45909 Unspecified asthma, uncomplicated: Secondary | ICD-10-CM | POA: Diagnosis not present

## 2011-10-26 DIAGNOSIS — J029 Acute pharyngitis, unspecified: Secondary | ICD-10-CM | POA: Diagnosis not present

## 2011-10-26 DIAGNOSIS — R0602 Shortness of breath: Secondary | ICD-10-CM | POA: Diagnosis not present

## 2011-10-26 DIAGNOSIS — M62838 Other muscle spasm: Secondary | ICD-10-CM | POA: Diagnosis not present

## 2011-10-26 DIAGNOSIS — R6889 Other general symptoms and signs: Secondary | ICD-10-CM | POA: Diagnosis not present

## 2011-10-26 DIAGNOSIS — M889 Osteitis deformans of unspecified bone: Secondary | ICD-10-CM | POA: Diagnosis not present

## 2011-10-26 DIAGNOSIS — E785 Hyperlipidemia, unspecified: Secondary | ICD-10-CM | POA: Diagnosis not present

## 2011-10-26 DIAGNOSIS — K228 Other specified diseases of esophagus: Secondary | ICD-10-CM | POA: Diagnosis not present

## 2011-10-26 DIAGNOSIS — I10 Essential (primary) hypertension: Secondary | ICD-10-CM | POA: Diagnosis not present

## 2011-10-26 DIAGNOSIS — R05 Cough: Secondary | ICD-10-CM | POA: Diagnosis not present

## 2011-10-26 DIAGNOSIS — J329 Chronic sinusitis, unspecified: Secondary | ICD-10-CM | POA: Diagnosis not present

## 2011-10-26 MED ORDER — MOMETASONE FUROATE 50 MCG/ACT NA SUSP: 2.0000 | Freq: Every day | NASAL | Status: DC

## 2011-10-26 MED ORDER — CLINDAMYCIN HCL 150 MG PO CAPS: 150.0000 mg | ORAL_CAPSULE | Freq: Three times a day (TID) | ORAL | Status: DC

## 2011-10-26 NOTE — Telephone Encounter (Signed)
Called and spoke with pt and she stated that she is having problems breathing, congested.  Stated that she snored all night and had a restless night.  She is stating that its in her upper airway that she is having all the problems and a sore throat.  Pt is wanting to be seen this am, feels that she cannot wait until this afternoon for an appt.  Please advise. thanks

## 2011-10-26 NOTE — Patient Instructions (Signed)
Start nasonex two puff each nostril daily Stop nystatin Use afrin daily for 5 days Stay on nasal rinse Finish doxycyline

## 2011-10-26 NOTE — Telephone Encounter (Signed)
We can work her in after lunch.  Too many consults this am to work in Or go to [employer] office with am visit with [employer]
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
## 2011-10-26 NOTE — Assessment & Plan Note (Signed)
Recurrent sinusitis d/t chronic rhinitis Difficult patient to manage d/t psych issues and multiple meds allergies/intolerances No evidence of oral thrush Plan Finish doxycycline No systemic prednisone  Try nasonex instead of qnasl Cont nasal rinse Stop nystatin Use afrin daily for 5 days

## 2011-10-26 NOTE — Telephone Encounter (Signed)
I spoke with pt and she stated [place] is tar far for her to have to drive to be seen. I scheduled pt to come in at 1:30 in the [place] office to see PW since he said it was okay. Pt stated she feels "clamped down" and may just end up going to the ED. I advised her if she feels like she can;t wait til 1:30 then she should go to the ED then. Nothing further was needed

## 2011-10-27 DIAGNOSIS — R6889 Other general symptoms and signs: Secondary | ICD-10-CM | POA: Diagnosis not present

## 2011-10-27 DIAGNOSIS — R0602 Shortness of breath: Secondary | ICD-10-CM | POA: Diagnosis not present

## 2011-10-31 DIAGNOSIS — J019 Acute sinusitis, unspecified: Secondary | ICD-10-CM | POA: Diagnosis not present

## 2011-11-01 DIAGNOSIS — F339 Major depressive disorder, recurrent, unspecified: Secondary | ICD-10-CM | POA: Diagnosis not present

## 2011-11-01 DIAGNOSIS — E876 Hypokalemia: Secondary | ICD-10-CM | POA: Diagnosis not present

## 2011-11-01 NOTE — Telephone Encounter (Signed)
They are going to fax report but there were no further reccomendations is what she read to me. i asked about chest ct but she did not see anything about a chest CT from November or recent

## 2011-11-01 NOTE — Telephone Encounter (Signed)
Will order diagn mammo of the left breast.

## 2011-11-02 ENCOUNTER — Encounter: Payer: Self-pay | Admitting: *Deleted

## 2011-11-02 ENCOUNTER — Telehealth: Payer: Self-pay | Admitting: *Deleted

## 2011-11-02 DIAGNOSIS — G501 Atypical facial pain: Secondary | ICD-10-CM | POA: Diagnosis not present

## 2011-11-02 DIAGNOSIS — J3089 Other allergic rhinitis: Secondary | ICD-10-CM | POA: Diagnosis not present

## 2011-11-02 DIAGNOSIS — J343 Hypertrophy of nasal turbinates: Secondary | ICD-10-CM | POA: Diagnosis not present

## 2011-11-02 NOTE — Telephone Encounter (Signed)
Letter faxed to gwen pearson per pt.

## 2011-11-02 NOTE — Telephone Encounter (Signed)
Pt calls and request a note for jury duty states she can't handle that due to her anxiety level. Says it needs to have her juror number on it which is [phone] and its for April 23. Fax to Attn: Veronda Prude [phone] in [place]

## 2011-11-02 NOTE — Telephone Encounter (Signed)
Ok for jury duty note excuse.

## 2011-11-06 ENCOUNTER — Telehealth: Payer: Self-pay | Admitting: *Deleted

## 2011-11-06 DIAGNOSIS — R0789 Other chest pain: Secondary | ICD-10-CM | POA: Diagnosis not present

## 2011-11-06 DIAGNOSIS — R110 Nausea: Secondary | ICD-10-CM | POA: Diagnosis not present

## 2011-11-06 DIAGNOSIS — Z881 Allergy status to other antibiotic agents status: Secondary | ICD-10-CM | POA: Diagnosis not present

## 2011-11-06 DIAGNOSIS — Z79899 Other long term (current) drug therapy: Secondary | ICD-10-CM | POA: Diagnosis not present

## 2011-11-06 DIAGNOSIS — Z888 Allergy status to other drugs, medicaments and biological substances status: Secondary | ICD-10-CM | POA: Diagnosis not present

## 2011-11-06 DIAGNOSIS — R0602 Shortness of breath: Secondary | ICD-10-CM | POA: Diagnosis not present

## 2011-11-06 DIAGNOSIS — R079 Chest pain, unspecified: Secondary | ICD-10-CM | POA: Diagnosis not present

## 2011-11-06 DIAGNOSIS — G4730 Sleep apnea, unspecified: Secondary | ICD-10-CM | POA: Diagnosis not present

## 2011-11-06 DIAGNOSIS — K219 Gastro-esophageal reflux disease without esophagitis: Secondary | ICD-10-CM | POA: Diagnosis not present

## 2011-11-06 DIAGNOSIS — M129 Arthropathy, unspecified: Secondary | ICD-10-CM | POA: Diagnosis not present

## 2011-11-06 NOTE — Telephone Encounter (Signed)
Pt states that she is in ED in [facility] at this time b/c her BP is elevated. States she is unsure whether or not she needs to take her BP med. Pt states the endocrinologist done lab work and states she is waiting on results. States they told her that her abnormal hormones could be the cause of her elevated BP. Pt also states that she is waiting to hear what she needs to do about the place in her breast. States that the endocrinologist told her to f/u up with PCP but states she hasn't done that yet b/c she only seen them 2 weeks ago. Please advise.

## 2011-11-06 NOTE — Telephone Encounter (Signed)
Please Check with Jennifer [name] and see if she can see the diag mammo. I put order in on 10/31/13 to be done at [facility].

## 2011-11-07 ENCOUNTER — Ambulatory Visit (INDEPENDENT_AMBULATORY_CARE_PROVIDER_SITE_OTHER): Payer: Medicare Other | Admitting: Family Medicine

## 2011-11-07 ENCOUNTER — Encounter: Payer: Self-pay | Admitting: Family Medicine

## 2011-11-07 VITALS — BP 145/98 | HR 214 | Ht 62.0 in | Wt 179.0 lb

## 2011-11-07 DIAGNOSIS — M79609 Pain in unspecified limb: Secondary | ICD-10-CM | POA: Diagnosis not present

## 2011-11-07 DIAGNOSIS — M79629 Pain in unspecified upper arm: Secondary | ICD-10-CM

## 2011-11-07 DIAGNOSIS — N644 Mastodynia: Secondary | ICD-10-CM

## 2011-11-07 DIAGNOSIS — IMO0001 Reserved for inherently not codable concepts without codable children: Secondary | ICD-10-CM

## 2011-11-07 DIAGNOSIS — R030 Elevated blood-pressure reading, without diagnosis of hypertension: Secondary | ICD-10-CM
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
## 2011-11-07 NOTE — Patient Instructions (Signed)
We will schedule your diagnostic mammogram. Will need to get your last film on CD form [place] and the CT from [facility] ED.

## 2011-11-07 NOTE — Telephone Encounter (Signed)
Spoke with Britt Boozer and she will schedule and call pt with appointment. KJ LPN

## 2011-11-08 ENCOUNTER — Other Ambulatory Visit: Payer: Self-pay | Admitting: Family Medicine

## 2011-11-08 ENCOUNTER — Encounter (HOSPITAL_BASED_OUTPATIENT_CLINIC_OR_DEPARTMENT_OTHER): Payer: Self-pay | Admitting: *Deleted

## 2011-11-08 ENCOUNTER — Emergency Department (HOSPITAL_BASED_OUTPATIENT_CLINIC_OR_DEPARTMENT_OTHER)
Admission: EM | Admit: 2011-11-08 | Discharge: 2011-11-08 | Disposition: A | Discharge status: Home / Self Care | Payer: Medicare Other | Attending: Emergency Medicine | Admitting: Emergency Medicine

## 2011-11-08 DIAGNOSIS — E785 Hyperlipidemia, unspecified: Secondary | ICD-10-CM | POA: Insufficient documentation

## 2011-11-08 DIAGNOSIS — R51 Headache: Secondary | ICD-10-CM

## 2011-11-08 DIAGNOSIS — D649 Anemia, unspecified: Secondary | ICD-10-CM | POA: Diagnosis not present

## 2011-11-08 DIAGNOSIS — I10 Essential (primary) hypertension: Secondary | ICD-10-CM | POA: Diagnosis not present

## 2011-11-08 DIAGNOSIS — Z87891 Personal history of nicotine dependence: Secondary | ICD-10-CM | POA: Diagnosis not present

## 2011-11-08 DIAGNOSIS — F4310 Post-traumatic stress disorder, unspecified: Secondary | ICD-10-CM | POA: Diagnosis not present

## 2011-11-08 DIAGNOSIS — K219 Gastro-esophageal reflux disease without esophagitis: Secondary | ICD-10-CM | POA: Diagnosis not present

## 2011-11-08 MED ORDER — HYDROCODONE-ACETAMINOPHEN 5-325 MG PO TABS
1.0000 | ORAL_TABLET | Freq: Once | ORAL | Status: DC
  Filled 2011-11-08: qty 1

## 2011-11-08 NOTE — Discharge Instructions (Signed)

## 2011-11-08 NOTE — ED Notes (Signed)
Patient refuses pain medication 
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
## 2011-11-08 NOTE — ED Notes (Signed)
Pt amb to triage with quick steady gait in nad. Pt reports "bp high" x 2 days, and that she has a headache. Pt is teary at triage, denies any visual changes or numbness or tingling or any other c/o. Pt seen by her endocrinologist one week ago, "she says it's all my hormones.Marland KitchenMarland Kitchen"

## 2011-11-13 ENCOUNTER — Telehealth: Payer: Self-pay | Admitting: Critical Care Medicine

## 2011-11-13 NOTE — Telephone Encounter (Signed)
Doubt this will add to our knowledge base

## 2011-11-13 NOTE — Telephone Encounter (Signed)
I spoke with pt and she states the chest tightness is when she is calm and thinks this is due to her lungs. She states she knows this is not "emotionally related". Pt was not very clear on what she was referring to and what exactly she was asking. She stated she feels like another PFT is appropriate to find out what's going on with her lungs. Please advise Dr. Delford Field, thanks

## 2011-11-13 NOTE — Telephone Encounter (Signed)
Spoke with pt and notified of recs per PW. She argues that she has never had a PFT done here before and still thinks she needs one. She is c/o increased SOB and chest tightness. I have scheduled her to see PW for eval in [place] on Thurs 11/16/11 and advised to seek emergent care sooner if needed. Pt verbalized understanding and states nothing further needed.

## 2011-11-13 NOTE — Telephone Encounter (Signed)
Called spoke with patient who reports that she is still having tightness in her chest with walking and when she goes outside - feels this is not emotionally related and "doesn't feel like anxiety."  Patient is asking if "another PFT" could be done (has not had one done per patient since approx 2005).  Requesting Dr Lynelle Doctor recs.  Pt did have a spirometry done 04/2010.  Dr Delford Field please advise, thanks.

## 2011-11-13 NOTE — Telephone Encounter (Signed)
PFTs have NEVER shown any abnormalities.  Therefore this is not necessary to order

## 2011-11-14 DIAGNOSIS — D649 Anemia, unspecified: Secondary | ICD-10-CM | POA: Diagnosis not present

## 2011-11-14 DIAGNOSIS — D509 Iron deficiency anemia, unspecified: Secondary | ICD-10-CM | POA: Diagnosis not present

## 2011-11-15 DIAGNOSIS — E876 Hypokalemia: Secondary | ICD-10-CM | POA: Diagnosis not present

## 2011-11-15 DIAGNOSIS — R5383 Other fatigue: Secondary | ICD-10-CM | POA: Diagnosis not present

## 2011-11-15 DIAGNOSIS — R5381 Other malaise: Secondary | ICD-10-CM | POA: Diagnosis not present

## 2011-11-15 DIAGNOSIS — I10 Essential (primary) hypertension: Secondary | ICD-10-CM | POA: Diagnosis not present

## 2011-11-16 ENCOUNTER — Telehealth: Payer: Self-pay | Admitting: Critical Care Medicine

## 2011-11-16 ENCOUNTER — Ambulatory Visit: Payer: Medicare Other | Admitting: Critical Care Medicine

## 2011-11-16 DIAGNOSIS — N63 Unspecified lump in unspecified breast: Secondary | ICD-10-CM | POA: Diagnosis not present

## 2011-11-16 DIAGNOSIS — R599 Enlarged lymph nodes, unspecified: Secondary | ICD-10-CM | POA: Diagnosis not present

## 2011-11-16 DIAGNOSIS — N644 Mastodynia: Secondary | ICD-10-CM | POA: Diagnosis not present

## 2011-11-16 NOTE — Telephone Encounter (Signed)
LMTCB  She was having SOB before and this is why the appt was scheduled. If still having SOB needs to simply re schedule the ov and need for spirometry can be decided at that time

## 2011-11-16 NOTE — Telephone Encounter (Signed)
I called pt and she is requesting to speak with PW personally. She would not give any more information in to what this is regarding just asked i get this to Dr. Delford Field. Please advise Dr. Delford Field thanks

## 2011-11-16 NOTE — Telephone Encounter (Signed)
She failed her appt this am.

## 2011-11-17 DIAGNOSIS — R0602 Shortness of breath: Secondary | ICD-10-CM | POA: Diagnosis not present

## 2011-11-17 DIAGNOSIS — F458 Other somatoform disorders: Secondary | ICD-10-CM | POA: Diagnosis not present

## 2011-11-17 DIAGNOSIS — I10 Essential (primary) hypertension: Secondary | ICD-10-CM | POA: Diagnosis not present

## 2011-11-17 DIAGNOSIS — J029 Acute pharyngitis, unspecified: Secondary | ICD-10-CM | POA: Diagnosis not present

## 2011-11-17 DIAGNOSIS — J309 Allergic rhinitis, unspecified: Secondary | ICD-10-CM | POA: Diagnosis not present

## 2011-11-17 DIAGNOSIS — Z888 Allergy status to other drugs, medicaments and biological substances status: Secondary | ICD-10-CM | POA: Diagnosis not present

## 2011-11-17 DIAGNOSIS — R1310 Dysphagia, unspecified: Secondary | ICD-10-CM | POA: Diagnosis not present

## 2011-11-17 DIAGNOSIS — R5381 Other malaise: Secondary | ICD-10-CM | POA: Diagnosis not present

## 2011-11-17 DIAGNOSIS — R499 Unspecified voice and resonance disorder: Secondary | ICD-10-CM | POA: Diagnosis not present

## 2011-11-17 DIAGNOSIS — Z882 Allergy status to sulfonamides status: Secondary | ICD-10-CM | POA: Diagnosis not present

## 2011-11-17 DIAGNOSIS — Z79899 Other long term (current) drug therapy: Secondary | ICD-10-CM | POA: Diagnosis not present

## 2011-11-17 DIAGNOSIS — R05 Cough: Secondary | ICD-10-CM | POA: Diagnosis not present

## 2011-11-17 DIAGNOSIS — M542 Cervicalgia: Secondary | ICD-10-CM | POA: Diagnosis not present

## 2011-11-17 DIAGNOSIS — R6889 Other general symptoms and signs: Secondary | ICD-10-CM | POA: Diagnosis not present

## 2011-11-17 DIAGNOSIS — Z880 Allergy status to penicillin: Secondary | ICD-10-CM | POA: Diagnosis not present

## 2011-11-17 DIAGNOSIS — R059 Cough, unspecified: Secondary | ICD-10-CM | POA: Diagnosis not present

## 2011-11-17 NOTE — Telephone Encounter (Signed)
Pt already aware.already had talked to her about this and she has had it done

## 2011-11-17 NOTE — Telephone Encounter (Signed)
[id number]

## 2011-11-18 ENCOUNTER — Emergency Department (INDEPENDENT_AMBULATORY_CARE_PROVIDER_SITE_OTHER): Payer: Medicare Other

## 2011-11-18 ENCOUNTER — Emergency Department (HOSPITAL_BASED_OUTPATIENT_CLINIC_OR_DEPARTMENT_OTHER)
Admission: EM | Admit: 2011-11-18 | Discharge: 2011-11-18 | Disposition: A | Discharge status: Home / Self Care | Payer: Medicare Other | Attending: Emergency Medicine | Admitting: Emergency Medicine

## 2011-11-18 ENCOUNTER — Encounter (HOSPITAL_BASED_OUTPATIENT_CLINIC_OR_DEPARTMENT_OTHER): Payer: Self-pay | Admitting: *Deleted

## 2011-11-18 DIAGNOSIS — J309 Allergic rhinitis, unspecified: Secondary | ICD-10-CM | POA: Diagnosis not present

## 2011-11-18 DIAGNOSIS — F4310 Post-traumatic stress disorder, unspecified: Secondary | ICD-10-CM | POA: Diagnosis not present

## 2011-11-18 DIAGNOSIS — R070 Pain in throat: Secondary | ICD-10-CM | POA: Diagnosis not present

## 2011-11-18 DIAGNOSIS — G4730 Sleep apnea, unspecified: Secondary | ICD-10-CM | POA: Diagnosis not present

## 2011-11-18 DIAGNOSIS — J302 Other seasonal allergic rhinitis: Secondary | ICD-10-CM

## 2011-11-18 DIAGNOSIS — I10 Essential (primary) hypertension: Secondary | ICD-10-CM | POA: Insufficient documentation

## 2011-11-18 DIAGNOSIS — K219 Gastro-esophageal reflux disease without esophagitis: Secondary | ICD-10-CM | POA: Insufficient documentation

## 2011-11-18 DIAGNOSIS — J029 Acute pharyngitis, unspecified: Secondary | ICD-10-CM

## 2011-11-18 DIAGNOSIS — J9819 Other pulmonary collapse: Secondary | ICD-10-CM

## 2011-11-18 DIAGNOSIS — R059 Cough, unspecified: Secondary | ICD-10-CM

## 2011-11-18 DIAGNOSIS — R05 Cough: Secondary | ICD-10-CM

## 2011-11-18 DIAGNOSIS — Z87891 Personal history of nicotine dependence: Secondary | ICD-10-CM | POA: Insufficient documentation

## 2011-11-18 DIAGNOSIS — D649 Anemia, unspecified: Secondary | ICD-10-CM | POA: Insufficient documentation

## 2011-11-18 DIAGNOSIS — E785 Hyperlipidemia, unspecified: Secondary | ICD-10-CM | POA: Insufficient documentation

## 2011-11-18 DIAGNOSIS — R0602 Shortness of breath: Secondary | ICD-10-CM | POA: Diagnosis not present

## 2011-11-18 NOTE — ED Notes (Addendum)
Pt c/o throat pain. Saw ENT at [facility] yesterday. "Swollen" Outside today and pain got worse. Some SHOB, "pulse high" Missed appt with pulmonologist this past week.

## 2011-11-18 NOTE — Discharge Instructions (Signed)
Hay Fever  Hay fever is a type of allergy that people have to things like grass, animals, or pollen from plants and flowers. It cannot be passed from one person to another. You cannot cure hay fever, but there are things that may help relieve your problems (symptoms). HOME CARE  Avoid the things that may be causing your problems.   Take all medicine as told by your doctor.  GET HELP RIGHT AWAY IF:  You have asthma, a cough, and you start making whistling sounds when breathing (wheezing).   Your tongue or lips are puffy (swollen).   You have trouble breathing.   You feel lightheaded or like you will pass out (faint).   You have a fever.   Your problems are getting worse and your medicine is not helping.   Your treatment was working, but your problems have come back.   You are stuffed up (congested) and have pressure in your face.   You have a headache.   You have cold sweats.  MAKE SURE YOU:  Understand these instructions.   Will watch your condition.   Will get help right away if you are not doing well or get worse.  Document Released: 11/30/2010 Document Revised: 07/20/2011 Document Reviewed: 11/30/2010 [facility] Patient Information 2012 [place], [place].Allergic Reaction, Mild to Moderate Allergies may happen from anything your body is sensitive to. This may be food, medications, pollens, chemicals, and nearly anything around you in everyday life that produces allergens. An allergen is anything that causes an allergy producing substance. Allergens cause your body to release allergic antibodies. Through a chain of events, they cause a release of histamine into the blood stream. Histamines are meant to protect you, but they also cause your discomfort. This is why antihistamines are often used for allergies. Heredity is often a factor in causing allergic reactions. This means you may have some of the same allergies as your parents. Allergies happen in all age groups. You may have  some idea of what caused your reaction. There are many allergens around [place]. It may be difficult to know what caused your reaction. If this is a first time event, it may never happen again. Allergies cannot be cured but can be controlled with medications. SYMPTOMS  You may get some or all of the following problems from allergies.  Swelling and itching in and around the mouth.   Tearing, itchy eyes.   Nasal congestion and runny nose.   Sneezing and coughing.   An itchy red rash or hives.   Vomiting or diarrhea.   Difficulty breathing.  Seasonal allergies occur in all age groups. They are seasonal because they usually occur during the same season every year. They may be a reaction to molds, grass pollens, or tree pollens. Other causes of allergies are house dust mite allergens, pet dander and mold spores. These are just a common few of the thousands of allergens around [place]. All of the symptoms listed above happen when you come in contact with pollens and other allergens. Seasonal allergies are usually not life threatening. They are generally more of a nuisance that can often be handled using medications. Hay fever is a combination of all or some of the above listed allergy problems. It may often be treated with simple over-the-counter medications such as diphenhydramine. Take medication as directed. Check with your caregiver or package insert for child dosages. TREATMENT AND HOME CARE INSTRUCTIONS If hives or rash are present:  Take medications as directed.   You may  use an over-the-counter antihistamine (diphenhydramine) for hives and itching as needed. Do not drive or drink alcohol until medications used to treat the reaction have worn off. Antihistamines tend to make people sleepy.   Apply cold cloths (compresses) to the skin or take baths in cool water. This will help itching. Avoid hot baths or showers. Heat will make a rash and itching worse.   If your allergies persist and become more  severe, and over the counter medications are not effective, there are many new medications your caretaker can prescribe. Immunotherapy or desensitizing injections can be used if all else fails. Follow up with your caregiver if problems continue.  SEEK MEDICAL CARE IF:   Your allergies are becoming progressively more troublesome.   You suspect a food allergy. Symptoms generally happen within 30 minutes of eating a food.   Your symptoms have not gone away within 2 days or are getting worse.   You develop new symptoms.   You want to retest yourself or your child with a food or drink you think causes an allergic reaction. Never test yourself or your child of a suspected allergy without being under the watchful eye of your caregivers. A second exposure to an allergen may be life-threatening.  SEEK IMMEDIATE MEDICAL CARE IF:  You develop difficulty breathing or wheezing, or have a tight feeling in your chest or throat.   You develop a swollen mouth, hives, swelling, or itching all over your body.  A severe reaction with any of the above problems should be considered life-threatening. If you suddenly develop difficulty breathing call for local emergency medical help. THIS IS AN EMERGENCY. MAKE SURE YOU:   Understand these instructions.   Will watch your condition.   Will get help right away if you are not doing well or get worse.  Document Released: 05/28/2007 Document Revised: 07/20/2011 Document Reviewed: 05/28/2007 [facility] Patient Information 2012 [place], [place].
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
## 2011-11-20 DIAGNOSIS — E876 Hypokalemia: Secondary | ICD-10-CM | POA: Diagnosis not present

## 2011-11-20 DIAGNOSIS — I10 Essential (primary) hypertension: Secondary | ICD-10-CM | POA: Diagnosis not present

## 2011-11-21 ENCOUNTER — Encounter: Payer: Self-pay | Admitting: Family Medicine

## 2011-11-21 DIAGNOSIS — Z9119 Patient's noncompliance with other medical treatment and regimen: Secondary | ICD-10-CM | POA: Diagnosis not present

## 2011-11-21 DIAGNOSIS — R1013 Epigastric pain: Secondary | ICD-10-CM | POA: Diagnosis not present

## 2011-11-21 DIAGNOSIS — J45909 Unspecified asthma, uncomplicated: Secondary | ICD-10-CM | POA: Diagnosis not present

## 2011-11-21 DIAGNOSIS — I10 Essential (primary) hypertension: Secondary | ICD-10-CM | POA: Diagnosis not present

## 2011-11-21 DIAGNOSIS — Z91199 Patient's noncompliance with other medical treatment and regimen due to unspecified reason: Secondary | ICD-10-CM | POA: Diagnosis not present

## 2011-11-21 DIAGNOSIS — M549 Dorsalgia, unspecified: Secondary | ICD-10-CM | POA: Diagnosis not present

## 2011-11-21 DIAGNOSIS — Z79899 Other long term (current) drug therapy: Secondary | ICD-10-CM | POA: Diagnosis not present

## 2011-11-21 NOTE — Telephone Encounter (Signed)
lmomtcb advising to call back and leave new msg for triage nurse if anything further is needed.  Per triage protocol, will sign off on this msg as 3 msgs have been left.

## 2011-11-23 DIAGNOSIS — F339 Major depressive disorder, recurrent, unspecified: Secondary | ICD-10-CM | POA: Diagnosis not present

## 2011-11-24 ENCOUNTER — Emergency Department (HOSPITAL_COMMUNITY): Payer: Medicare Other

## 2011-11-24 ENCOUNTER — Telehealth: Payer: Self-pay | Admitting: Gastroenterology

## 2011-11-24 ENCOUNTER — Emergency Department (HOSPITAL_COMMUNITY)
Admission: EM | Admit: 2011-11-24 | Discharge: 2011-11-24 | Disposition: A | Discharge status: Home / Self Care | Payer: Medicare Other | Attending: Emergency Medicine | Admitting: Emergency Medicine

## 2011-11-24 ENCOUNTER — Encounter (HOSPITAL_COMMUNITY): Payer: Self-pay | Admitting: Emergency Medicine

## 2011-11-24 DIAGNOSIS — K219 Gastro-esophageal reflux disease without esophagitis: Secondary | ICD-10-CM | POA: Diagnosis not present

## 2011-11-24 DIAGNOSIS — G4730 Sleep apnea, unspecified: Secondary | ICD-10-CM | POA: Insufficient documentation

## 2011-11-24 DIAGNOSIS — Z79899 Other long term (current) drug therapy: Secondary | ICD-10-CM | POA: Diagnosis not present

## 2011-11-24 DIAGNOSIS — R109 Unspecified abdominal pain: Secondary | ICD-10-CM | POA: Insufficient documentation

## 2011-11-24 DIAGNOSIS — D1809 Hemangioma of other sites: Secondary | ICD-10-CM | POA: Diagnosis not present

## 2011-11-24 DIAGNOSIS — R1013 Epigastric pain: Secondary | ICD-10-CM | POA: Diagnosis not present

## 2011-11-24 DIAGNOSIS — R1012 Left upper quadrant pain: Secondary | ICD-10-CM | POA: Diagnosis not present

## 2011-11-24 DIAGNOSIS — R1011 Right upper quadrant pain: Secondary | ICD-10-CM | POA: Diagnosis not present

## 2011-11-24 DIAGNOSIS — R110 Nausea: Secondary | ICD-10-CM | POA: Diagnosis not present

## 2011-11-24 LAB — CBC
HCT: 39.1 % (ref 36.0–46.0)
Hemoglobin: 13 g/dL (ref 12.0–15.0)
MCH: 29.7 pg (ref 26.0–34.0)
MCHC: 33.2 g/dL (ref 30.0–36.0)
MCV: 89.3 fL (ref 78.0–100.0)
Platelets: 183 10*3/uL (ref 150–400)
RBC: 4.38 MIL/uL (ref 3.87–5.11)
RDW: 13.2 % (ref 11.5–15.5)
WBC: 5.1 10*3/uL (ref 4.0–10.5)

## 2011-11-24 LAB — URINALYSIS, ROUTINE W REFLEX MICROSCOPIC
Bilirubin Urine: NEGATIVE
Glucose, UA: NEGATIVE mg/dL
Ketones, ur: NEGATIVE mg/dL
Leukocytes, UA: NEGATIVE
Nitrite: NEGATIVE
Protein, ur: NEGATIVE mg/dL
Specific Gravity, Urine: 1.016 (ref 1.005–1.030)
Urobilinogen, UA: 0.2 mg/dL (ref 0.0–1.0)
pH: 8 (ref 5.0–8.0)

## 2011-11-24 LAB — COMPREHENSIVE METABOLIC PANEL
ALT: 18 U/L (ref 0–35)
AST: 14 U/L (ref 0–37)
Albumin: 3.7 g/dL (ref 3.5–5.2)
Alkaline Phosphatase: 61 U/L (ref 39–117)
BUN: 12 mg/dL (ref 6–23)
CO2: 21 mEq/L (ref 19–32)
Calcium: 8.8 mg/dL (ref 8.4–10.5)
Chloride: 107 mEq/L (ref 96–112)
Creatinine, Ser: 0.53 mg/dL (ref 0.50–1.10)
GFR calc Af Amer: >90 mL/min (ref >90)
GFR calc non Af Amer: >90 mL/min (ref >90)
Glucose, Bld: 90 mg/dL (ref 70–99)
Potassium: 3.7 mEq/L (ref 3.5–5.1)
Sodium: 140 mEq/L (ref 135–145)
Total Bilirubin: 0.3 mg/dL (ref 0.3–1.2)
Total Protein: 7 g/dL (ref 6.0–8.3)

## 2011-11-24 LAB — URINE MICROSCOPIC-ADD ON

## 2011-11-24 LAB — POCT PREGNANCY, URINE: Preg Test, Ur: NEGATIVE

## 2011-11-24 LAB — LIPASE, BLOOD: Lipase: 45 U/L (ref 11–59)

## 2011-11-24 MED ORDER — OMEPRAZOLE 2 MG/ML ORAL SUSPENSION: 20.0000 mg | Freq: Every day | ORAL | Status: DC

## 2011-11-24 MED ORDER — ONDANSETRON HCL 4 MG/2ML IJ SOLN
4.0000 mg | Freq: Once | INTRAMUSCULAR | Status: AC
  Administered 2011-11-24: 4 mg via INTRAVENOUS
  Filled 2011-11-24: qty 2

## 2011-11-24 MED ORDER — ONDANSETRON 8 MG PO TBDP: 8.0000 mg | ORAL_TABLET | Freq: Three times a day (TID) | ORAL | Status: AC | PRN

## 2011-11-24 MED ORDER — IOHEXOL 300 MG/ML  SOLN
100.0000 mL | Freq: Once | INTRAMUSCULAR | Status: AC | PRN
  Administered 2011-11-24: 100 mL via INTRAVENOUS

## 2011-11-24 MED ORDER — SODIUM CHLORIDE 0.9 % IV BOLUS (SEPSIS)
1000.0000 mL | Freq: Once | INTRAVENOUS | Status: AC
  Administered 2011-11-24: 1000 mL via INTRAVENOUS
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
## 2011-11-24 NOTE — ED Notes (Signed)
Pt states that she has chronic abd pain but it is usally on the rt side today it is on the left side. States that she has had a small bm today and does not think that she is constipated due to is usally is on the rt side if so. Pt states that she is unable to put on our gowns or sheets due to she has an allergy to the detergent. No n/v states she does have some bad taste in her mouth for the past 2 days

## 2011-11-24 NOTE — Discharge Instructions (Signed)
Return to the ED with any concerns including vomiting and not able to keep down liquids, fever/chills, worsening pain, decreased level of alertness/lethargy, or any other alarming symptoms  On the CT scan today, no findings were present to explain your pain- however, you do have  Renal cyst on the right side- you will need an ultrasound in the next 6 months to recheck this.  You should also be sure to have a pap smear done by your doctor if you are not up to date on this test.

## 2011-11-24 NOTE — Telephone Encounter (Signed)
Pt states she is having abdominal pain. Requesting to be seen today. Informed pt there were no available appts. Encouraged pt to see her PCP or go to urgent care if she needed to be seen today. Pt requested to see PA next week. Pt scheduled to see Mike Gip PA 11/29/11 at 9:30am. Pt aware of appt date and time.

## 2011-11-26 DIAGNOSIS — R319 Hematuria, unspecified: Secondary | ICD-10-CM | POA: Diagnosis not present

## 2011-11-26 DIAGNOSIS — M549 Dorsalgia, unspecified: Secondary | ICD-10-CM | POA: Diagnosis not present

## 2011-11-26 DIAGNOSIS — R109 Unspecified abdominal pain: Secondary | ICD-10-CM | POA: Diagnosis not present

## 2011-11-26 DIAGNOSIS — R0602 Shortness of breath: Secondary | ICD-10-CM | POA: Diagnosis not present

## 2011-11-27 DIAGNOSIS — F319 Bipolar disorder, unspecified: Secondary | ICD-10-CM | POA: Diagnosis not present

## 2011-11-27 DIAGNOSIS — J309 Allergic rhinitis, unspecified: Secondary | ICD-10-CM | POA: Diagnosis not present

## 2011-11-27 DIAGNOSIS — R1310 Dysphagia, unspecified: Secondary | ICD-10-CM | POA: Diagnosis not present

## 2011-11-28 ENCOUNTER — Encounter: Payer: Self-pay | Admitting: Physician Assistant

## 2011-11-28 ENCOUNTER — Ambulatory Visit (INDEPENDENT_AMBULATORY_CARE_PROVIDER_SITE_OTHER): Payer: Medicare Other | Admitting: Physician Assistant

## 2011-11-28 VITALS — BP 128/86 | HR 97 | Wt 181.0 lb

## 2011-11-28 DIAGNOSIS — J309 Allergic rhinitis, unspecified: Secondary | ICD-10-CM

## 2011-11-28 DIAGNOSIS — F319 Bipolar disorder, unspecified: Secondary | ICD-10-CM | POA: Diagnosis not present

## 2011-11-28 DIAGNOSIS — R51 Headache: Secondary | ICD-10-CM

## 2011-11-28 DIAGNOSIS — J302 Other seasonal allergic rhinitis: Secondary | ICD-10-CM

## 2011-11-28 NOTE — Patient Instructions (Signed)
STart using Mucinex without D. Drink lots of water. Continue taking Tylenol for headache. Continue on Nasonex for nasal congestion.
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
## 2011-11-29 ENCOUNTER — Ambulatory Visit: Payer: Medicare Other | Admitting: Physician Assistant

## 2011-11-29 DIAGNOSIS — R059 Cough, unspecified: Secondary | ICD-10-CM | POA: Diagnosis not present

## 2011-11-29 DIAGNOSIS — R05 Cough: Secondary | ICD-10-CM | POA: Diagnosis not present

## 2011-11-29 DIAGNOSIS — R0609 Other forms of dyspnea: Secondary | ICD-10-CM | POA: Diagnosis not present

## 2011-11-30 DIAGNOSIS — E876 Hypokalemia: Secondary | ICD-10-CM | POA: Diagnosis not present

## 2011-11-30 DIAGNOSIS — K219 Gastro-esophageal reflux disease without esophagitis: Secondary | ICD-10-CM | POA: Diagnosis not present

## 2011-11-30 DIAGNOSIS — G4733 Obstructive sleep apnea (adult) (pediatric): Secondary | ICD-10-CM | POA: Diagnosis not present

## 2011-11-30 DIAGNOSIS — IMO0001 Reserved for inherently not codable concepts without codable children: Secondary | ICD-10-CM | POA: Diagnosis not present

## 2011-11-30 DIAGNOSIS — E269 Hyperaldosteronism, unspecified: Secondary | ICD-10-CM | POA: Diagnosis not present

## 2011-11-30 DIAGNOSIS — R269 Unspecified abnormalities of gait and mobility: Secondary | ICD-10-CM | POA: Diagnosis not present

## 2011-11-30 DIAGNOSIS — I10 Essential (primary) hypertension: Secondary | ICD-10-CM | POA: Diagnosis not present

## 2011-11-30 DIAGNOSIS — D649 Anemia, unspecified: Secondary | ICD-10-CM | POA: Diagnosis not present

## 2011-11-30 IMAGING — US US ABDOMEN COMPLETE
1 series · 14 of 25 positions shown · non-contrast
Comparison: CT 01/25/2010

CLINICAL DATA: Abdominal pain and.

COMPLETE ABDOMINAL ULTRASOUND

[Series 1: us abdomen complete · 0.33mm/px · 14 of 72 slices shown]
[im 1/72]
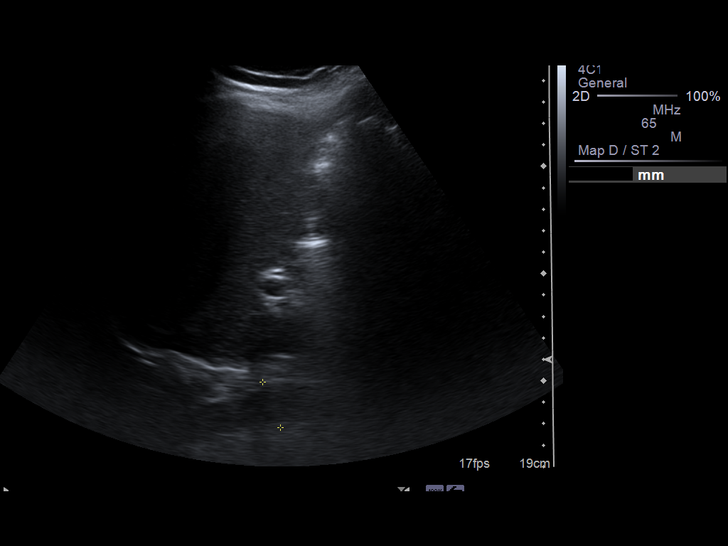
[im 6/72]
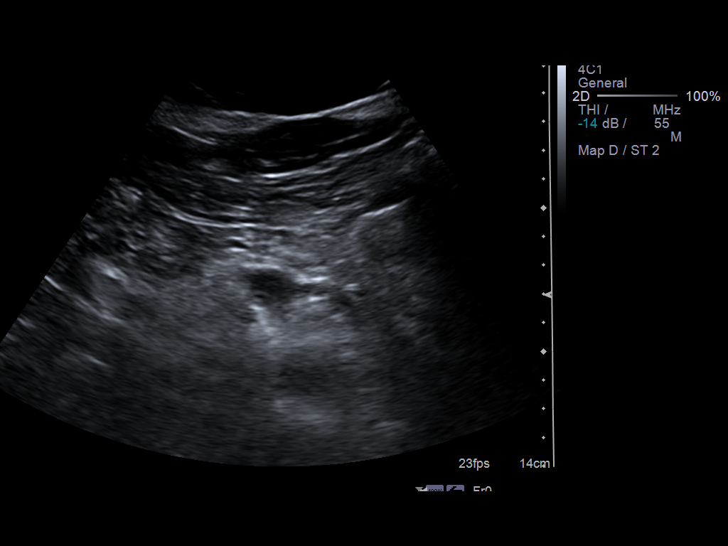
[im 12/72]
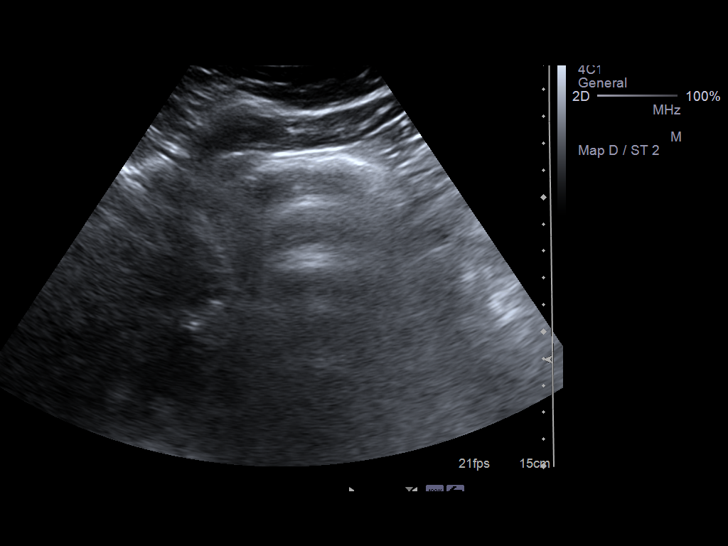
[im 18/72]
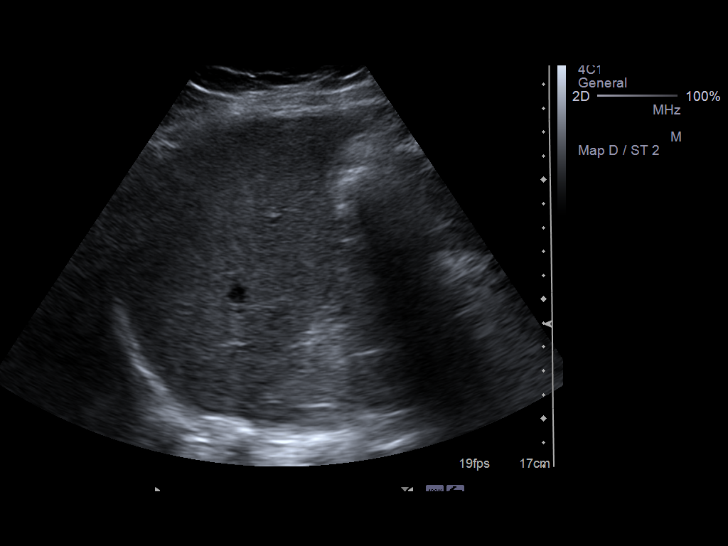
[im 24/72]
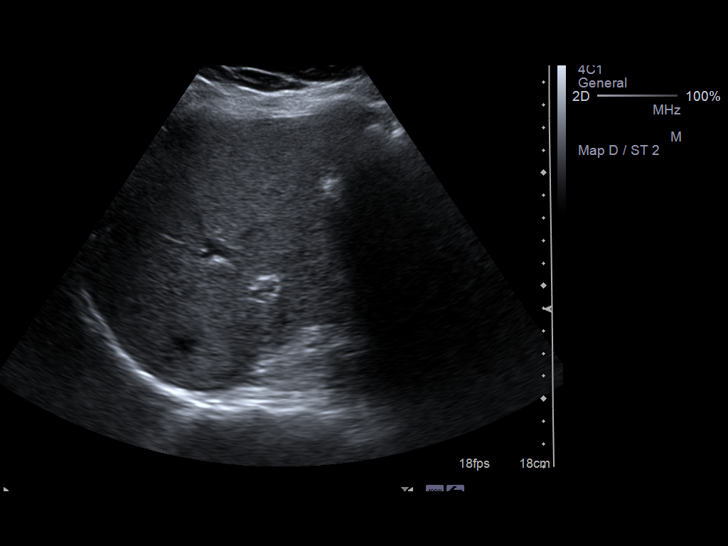
[im 27/72]
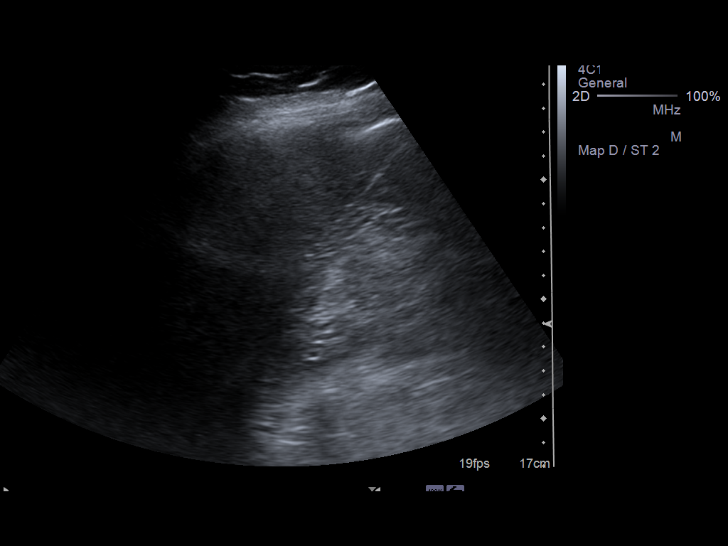
[im 33/72]
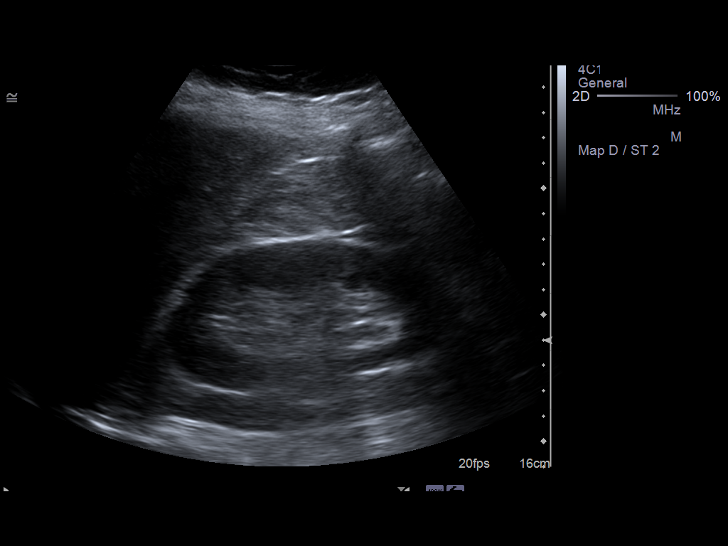
[im 39/72]
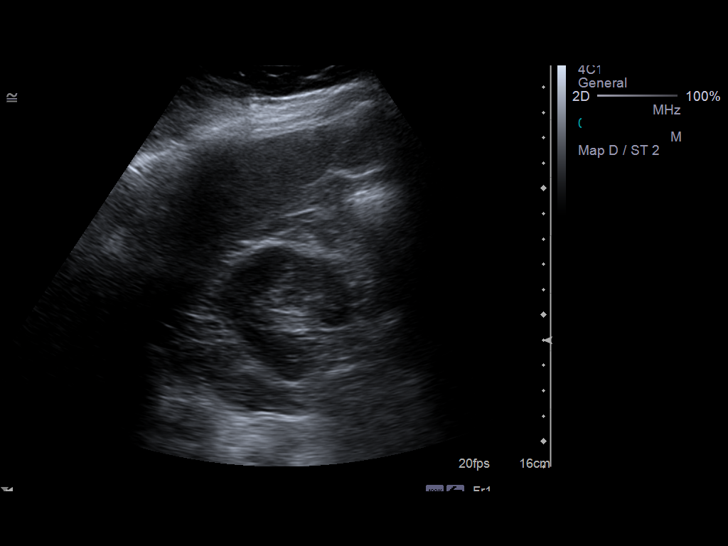
[im 45/72]
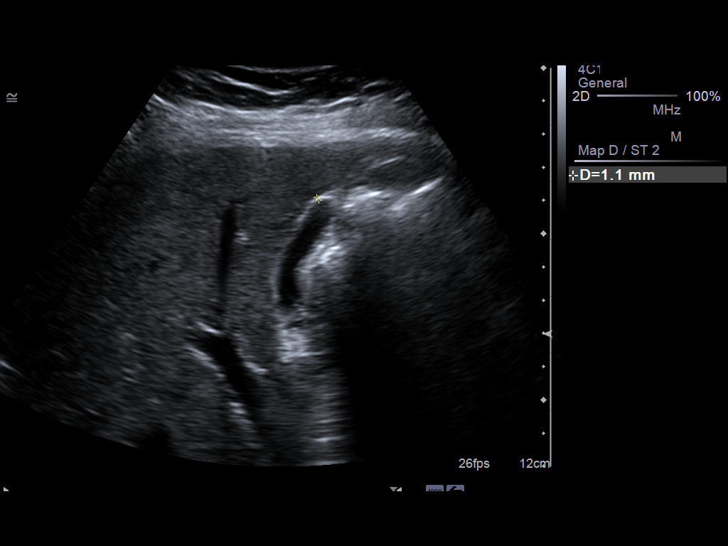
[im 48/72]
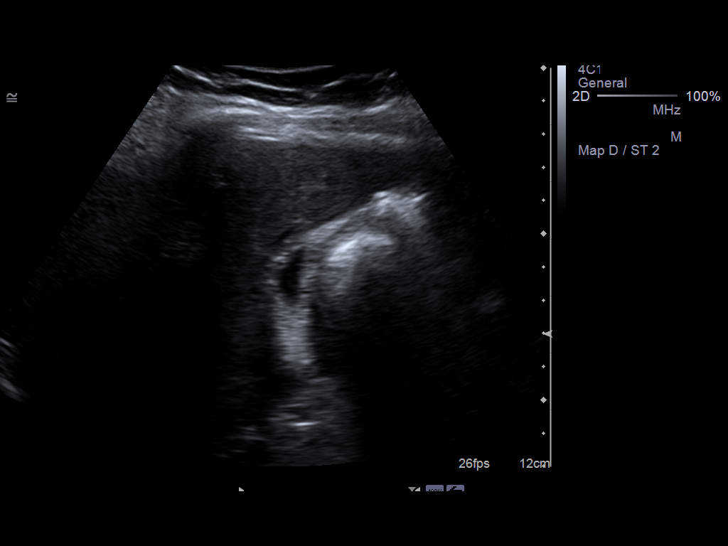
[im 54/72]
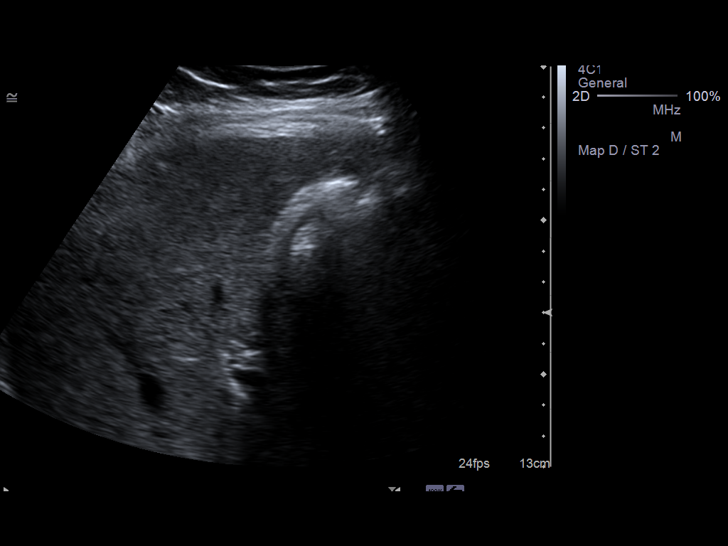
[im 60/72]
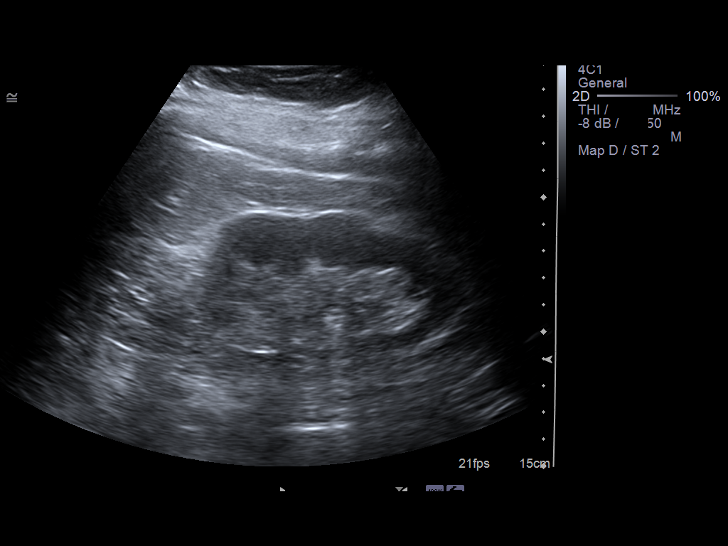
[im 66/72]
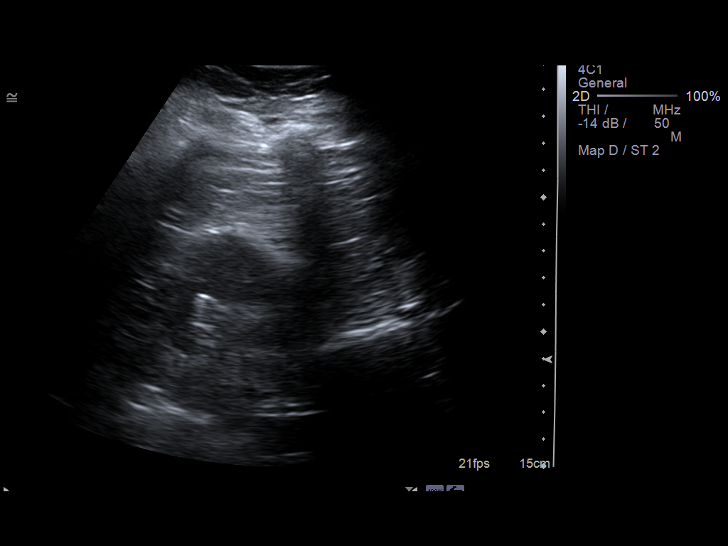
[im 72/72]
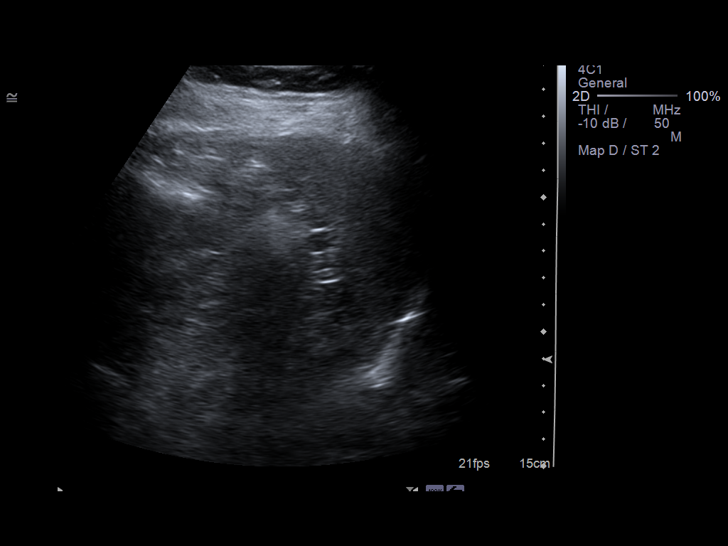

[14 of 25 positions shown; findings below may reference images not displayed]

FINDINGS: Gallbladder:  Gallbladder is small and contracted.  No stones
visualized.  No wall thickening.

Common bile duct:   Normal caliber, 3 mm.

Liver:  No focal lesion identified.  Within normal limits in
parenchymal echogenicity.

IVC:  Appears normal.

Pancreas:  No focal abnormality seen.

Spleen:  Within normal limits in size and echotexture.

Right Kidney:   Normal in size and parenchymal echogenicity.  No
evidence of mass or hydronephrosis.

Left Kidney:  Normal in size and parenchymal echogenicity.  No
evidence of mass or hydronephrosis.

Abdominal aorta:  No aneurysm identified.
IMPRESSION: Contracted gallbladder.  No evidence of cholelithiasis or acute
cholecystitis.

## 2011-11-30 IMAGING — CR DG ABDOMEN 1V
1 series · 1 of 1 positions shown · non-contrast
Comparison: 01/24/2010

CLINICAL DATA: Abdominal pain

ABDOMEN - 1 VIEW

[t abdomen supine]
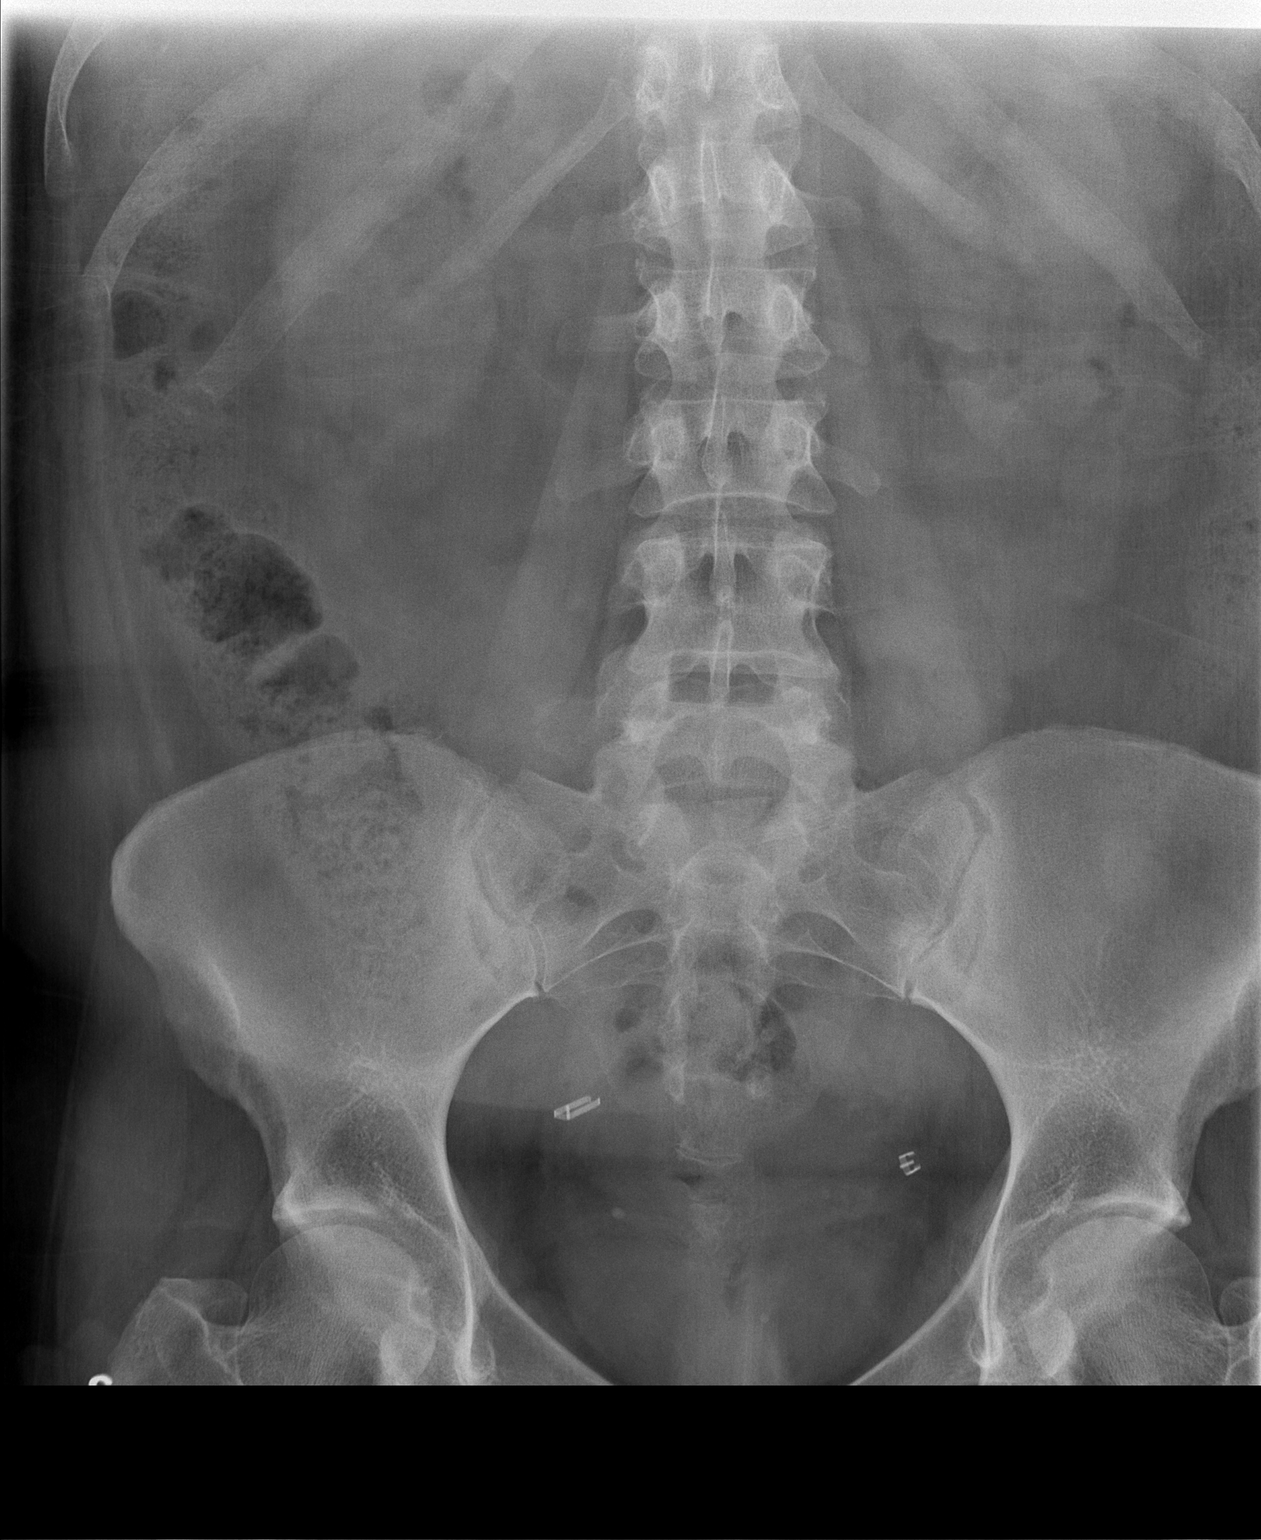

[1 of 1 positions shown; findings below may reference images not displayed]

FINDINGS: Bowel gas pattern normal.  Psoas margins intact.  No
pathological calcifications or osseous lesions.

Prior tubal ligation.
IMPRESSION: No acute or significant findings.

## 2011-12-01 DIAGNOSIS — R141 Gas pain: Secondary | ICD-10-CM | POA: Diagnosis not present

## 2011-12-01 DIAGNOSIS — R109 Unspecified abdominal pain: Secondary | ICD-10-CM | POA: Diagnosis not present

## 2011-12-01 DIAGNOSIS — R1084 Generalized abdominal pain: Secondary | ICD-10-CM | POA: Diagnosis not present

## 2011-12-01 DIAGNOSIS — Z79899 Other long term (current) drug therapy: Secondary | ICD-10-CM | POA: Diagnosis not present

## 2011-12-01 DIAGNOSIS — R143 Flatulence: Secondary | ICD-10-CM | POA: Diagnosis not present

## 2011-12-05 DIAGNOSIS — H16219 Exposure keratoconjunctivitis, unspecified eye: Secondary | ICD-10-CM | POA: Diagnosis not present

## 2011-12-05 DIAGNOSIS — H4910 Fourth [trochlear] nerve palsy, unspecified eye: Secondary | ICD-10-CM | POA: Diagnosis not present

## 2011-12-05 DIAGNOSIS — H16229 Keratoconjunctivitis sicca, not specified as Sjogren's, unspecified eye: Secondary | ICD-10-CM | POA: Diagnosis not present

## 2011-12-05 DIAGNOSIS — H52209 Unspecified astigmatism, unspecified eye: Secondary | ICD-10-CM | POA: Diagnosis not present

## 2011-12-06 DIAGNOSIS — F339 Major depressive disorder, recurrent, unspecified: Secondary | ICD-10-CM | POA: Diagnosis not present

## 2011-12-06 DIAGNOSIS — K219 Gastro-esophageal reflux disease without esophagitis: Secondary | ICD-10-CM | POA: Diagnosis not present

## 2011-12-07 ENCOUNTER — Inpatient Hospital Stay (HOSPITAL_COMMUNITY)
Admission: AD | Admit: 2011-12-07 | Discharge: 2011-12-07 | Disposition: A | Discharge status: Home / Self Care | Payer: Medicare Other | Source: Ambulatory Visit | Attending: Obstetrics & Gynecology | Admitting: Obstetrics & Gynecology

## 2011-12-07 ENCOUNTER — Encounter (HOSPITAL_COMMUNITY): Payer: Self-pay | Admitting: *Deleted

## 2011-12-07 DIAGNOSIS — C4499 Other specified malignant neoplasm of skin, unspecified: Secondary | ICD-10-CM | POA: Diagnosis not present

## 2011-12-07 DIAGNOSIS — N909 Noninflammatory disorder of vulva and perineum, unspecified: Secondary | ICD-10-CM | POA: Diagnosis not present

## 2011-12-07 DIAGNOSIS — IMO0002 Reserved for concepts with insufficient information to code with codable children: Secondary | ICD-10-CM | POA: Insufficient documentation

## 2011-12-07 DIAGNOSIS — N942 Vaginismus: Secondary | ICD-10-CM | POA: Diagnosis not present

## 2011-12-07 DIAGNOSIS — C519 Malignant neoplasm of vulva, unspecified: Secondary | ICD-10-CM

## 2011-12-07 MED ORDER — LIDOCAINE HCL 2 % EX GEL: CUTANEOUS | Status: DC | PRN

## 2011-12-07 MED ORDER — NYSTATIN-TRIAMCINOLONE 100000-0.1 UNIT/GM-% EX CREA: TOPICAL_CREAM | CUTANEOUS | Status: DC
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
## 2011-12-07 NOTE — [facility] Note (Signed)
Pt states had partial vulvectomy, multiple gyn issues, fibroids, etc. Has extreme pain with intercourse lasting into the next day. LMP-11/26/2011, abnormal and noted a lot of pain. Has noted gray vaginal discharge, has mild discomfort, feels pain at scar from vulvectomy.

## 2011-12-08 ENCOUNTER — Ambulatory Visit: Payer: Medicare Other | Admitting: Family Medicine

## 2011-12-11 DIAGNOSIS — F339 Major depressive disorder, recurrent, unspecified: Secondary | ICD-10-CM | POA: Diagnosis not present

## 2011-12-12 DIAGNOSIS — E876 Hypokalemia: Secondary | ICD-10-CM | POA: Diagnosis not present

## 2011-12-12 DIAGNOSIS — F339 Major depressive disorder, recurrent, unspecified: Secondary | ICD-10-CM | POA: Diagnosis not present

## 2011-12-12 DIAGNOSIS — I10 Essential (primary) hypertension: Secondary | ICD-10-CM | POA: Diagnosis not present
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
## 2011-12-14 DIAGNOSIS — M542 Cervicalgia: Secondary | ICD-10-CM | POA: Diagnosis not present

## 2011-12-16 IMAGING — CR DG CHEST 2V
2 series · 2 of 2 positions shown · non-contrast
Comparison: Chest 07/20/2010.

CLINICAL DATA: Pain.  Shortness of breath.

CHEST - 2 VIEW

[w chest pa]
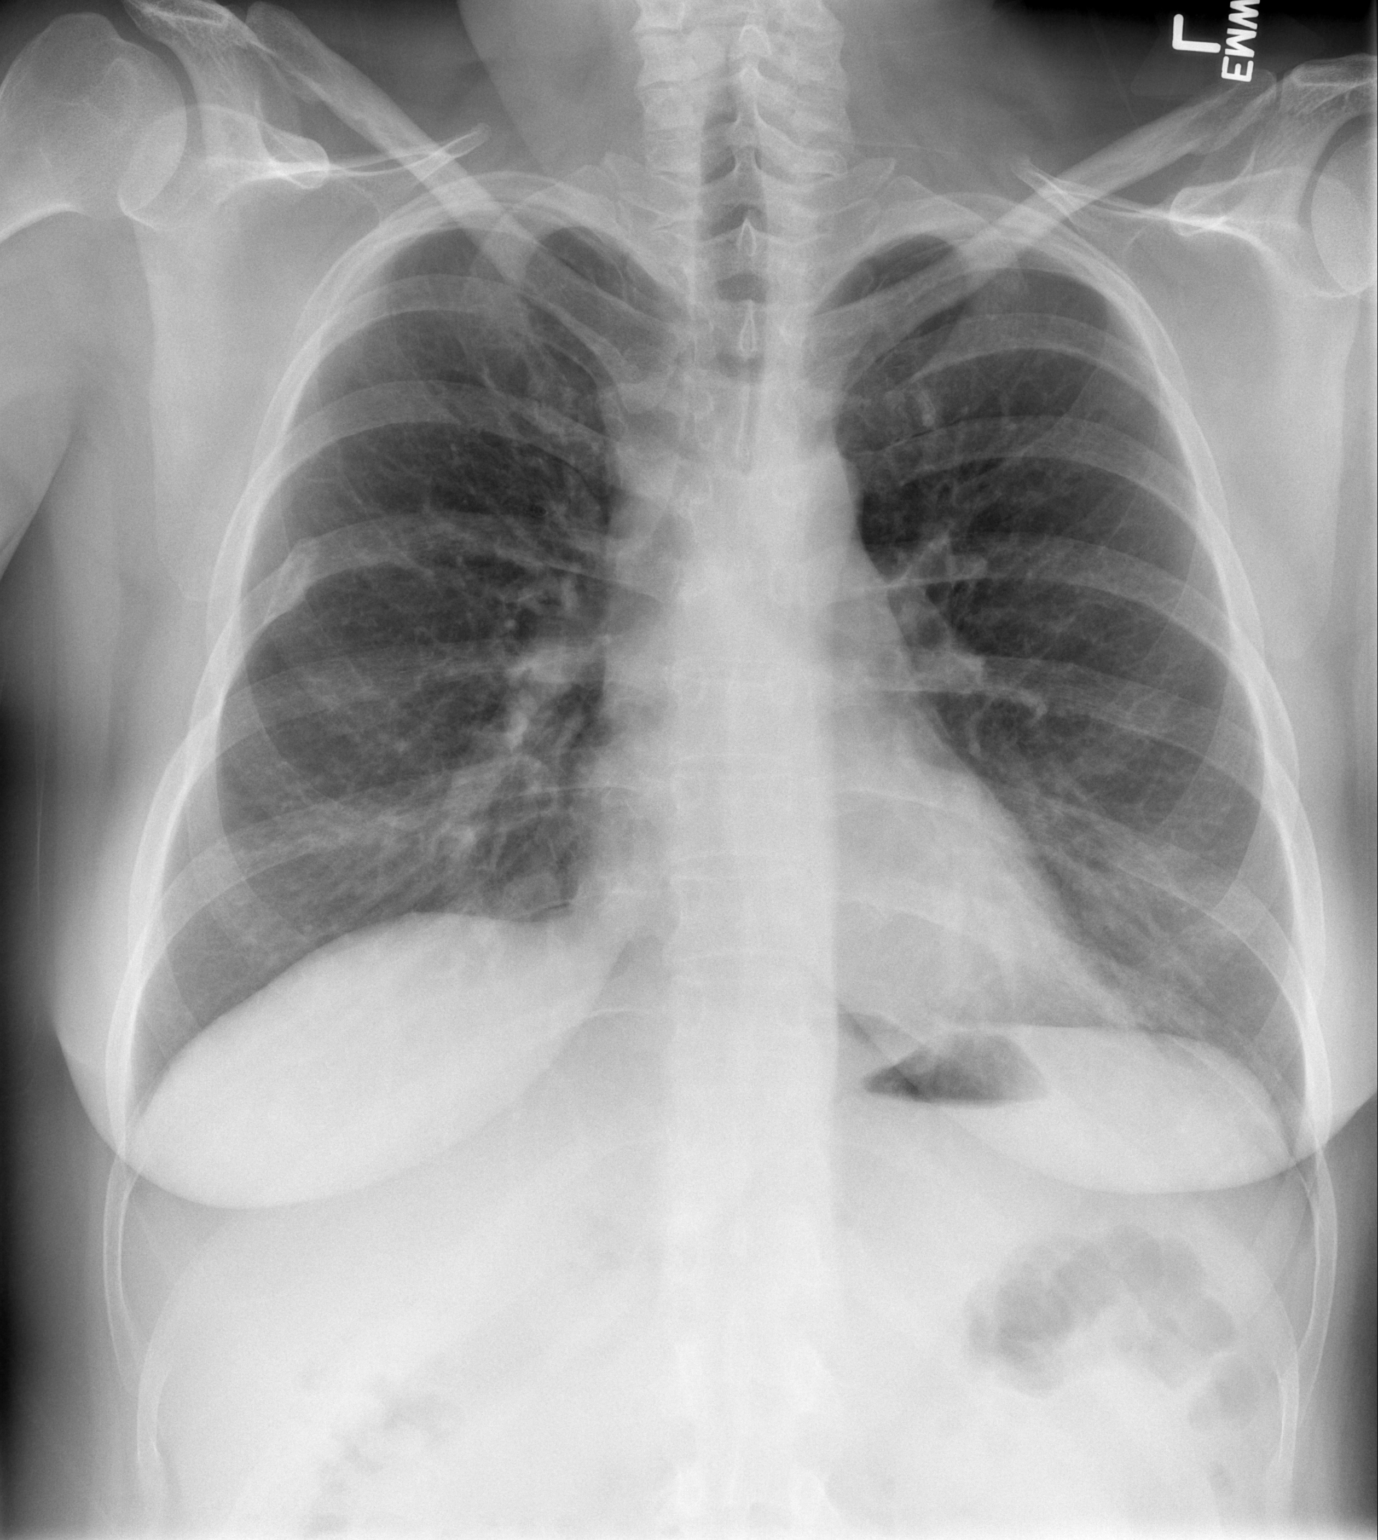

[w chest lat]
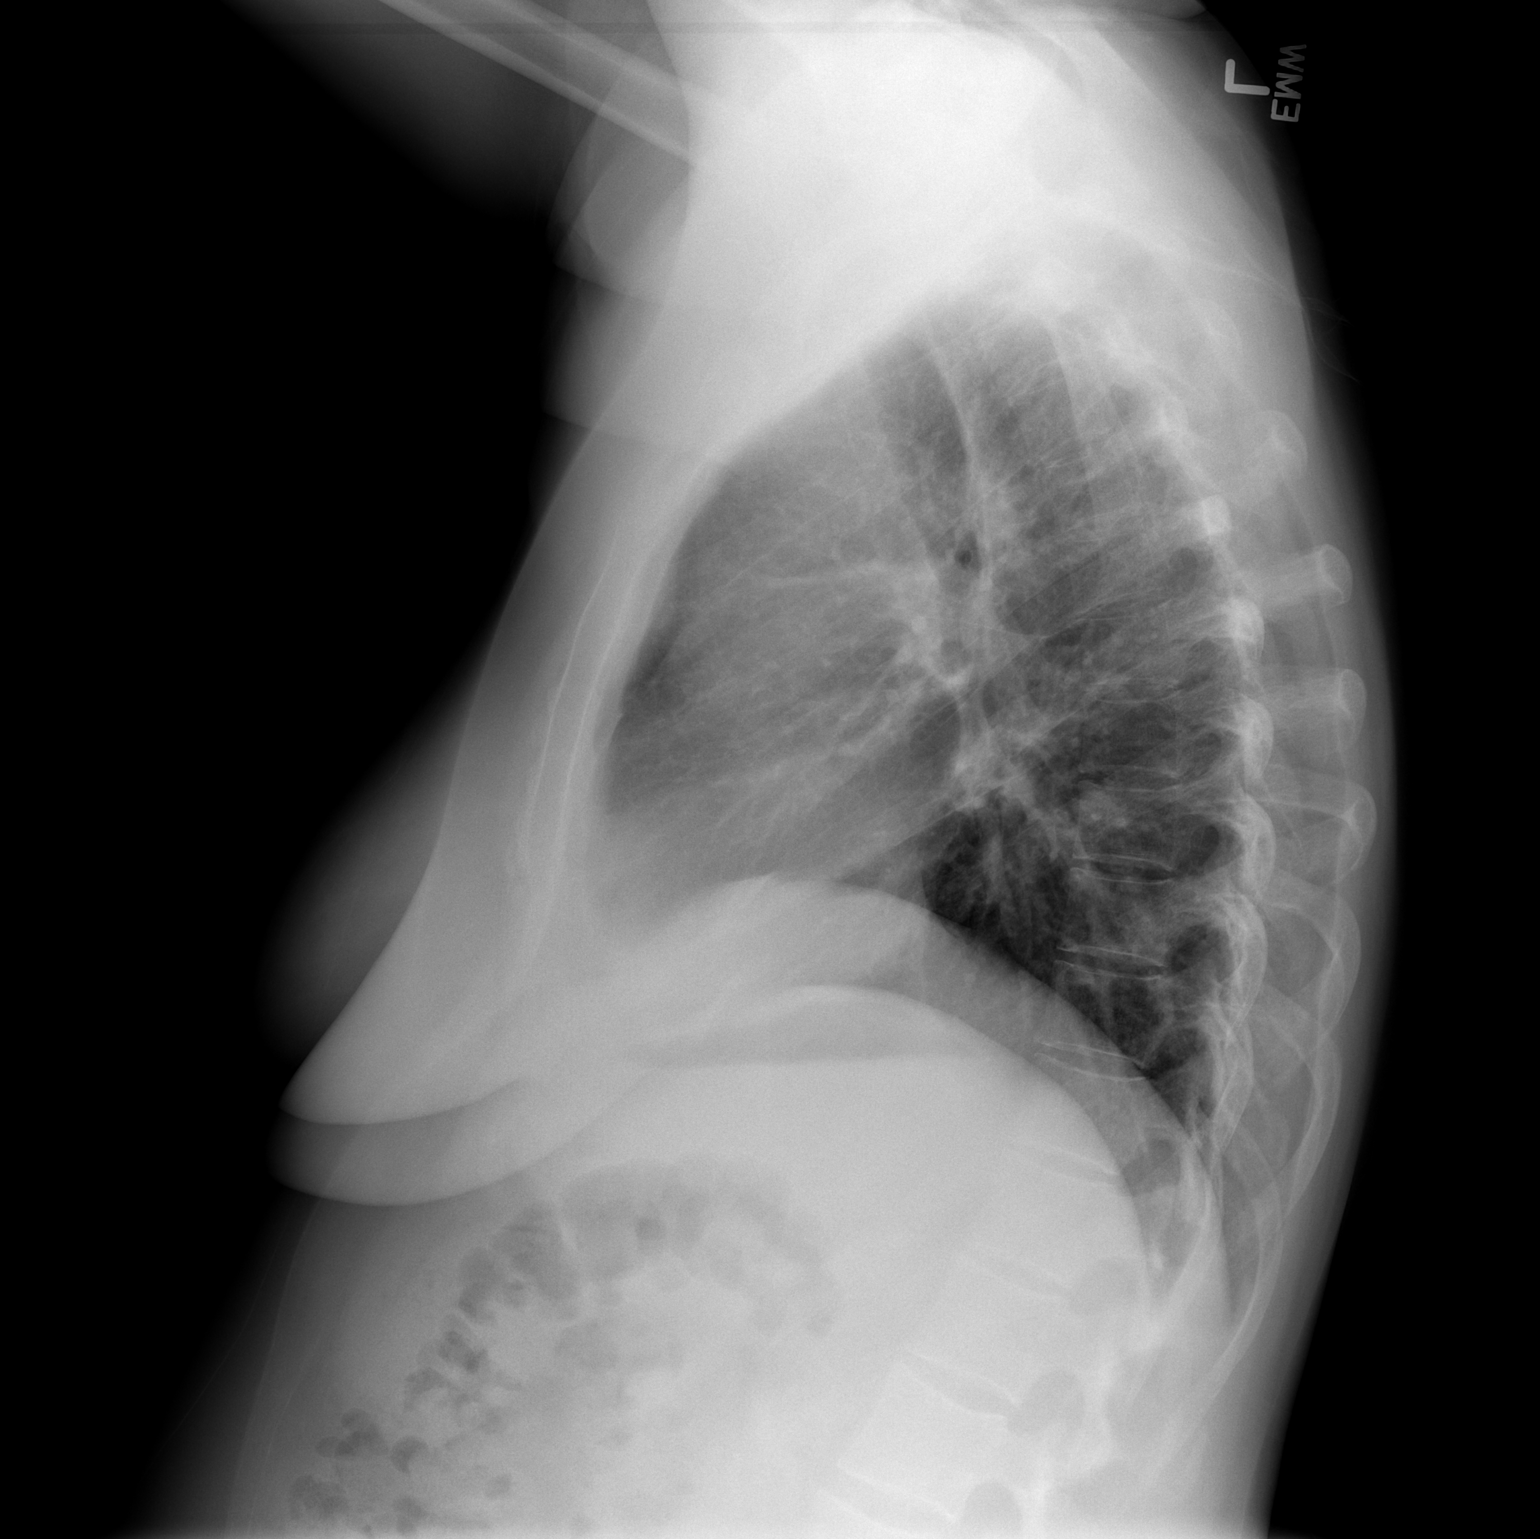

[2 of 2 positions shown; findings below may reference images not displayed]

FINDINGS: Lungs are clear.  Heart size is normal.  No pneumothorax
or pleural effusion.  Old right rib fracture noted.
IMPRESSION: No acute finding.

## 2011-12-18 DIAGNOSIS — J45909 Unspecified asthma, uncomplicated: Secondary | ICD-10-CM | POA: Diagnosis not present

## 2011-12-18 DIAGNOSIS — L500 Allergic urticaria: Secondary | ICD-10-CM | POA: Diagnosis not present

## 2011-12-18 DIAGNOSIS — J309 Allergic rhinitis, unspecified: Secondary | ICD-10-CM | POA: Diagnosis not present

## 2011-12-19 ENCOUNTER — Encounter (HOSPITAL_BASED_OUTPATIENT_CLINIC_OR_DEPARTMENT_OTHER): Payer: Self-pay | Admitting: Family Medicine

## 2011-12-19 ENCOUNTER — Emergency Department (HOSPITAL_BASED_OUTPATIENT_CLINIC_OR_DEPARTMENT_OTHER)
Admission: EM | Admit: 2011-12-19 | Discharge: 2011-12-19 | Disposition: A | Discharge status: Home / Self Care | Payer: Medicare Other | Attending: Emergency Medicine | Admitting: Emergency Medicine

## 2011-12-19 DIAGNOSIS — I10 Essential (primary) hypertension: Secondary | ICD-10-CM | POA: Insufficient documentation

## 2011-12-19 DIAGNOSIS — R109 Unspecified abdominal pain: Secondary | ICD-10-CM | POA: Diagnosis not present

## 2011-12-19 DIAGNOSIS — E785 Hyperlipidemia, unspecified: Secondary | ICD-10-CM | POA: Diagnosis not present

## 2011-12-19 DIAGNOSIS — N72 Inflammatory disease of cervix uteri: Secondary | ICD-10-CM | POA: Diagnosis not present

## 2011-12-19 DIAGNOSIS — R569 Unspecified convulsions: Secondary | ICD-10-CM | POA: Diagnosis not present

## 2011-12-19 DIAGNOSIS — R6884 Jaw pain: Secondary | ICD-10-CM | POA: Insufficient documentation

## 2011-12-19 DIAGNOSIS — M545 Low back pain, unspecified: Secondary | ICD-10-CM | POA: Diagnosis not present

## 2011-12-19 DIAGNOSIS — D259 Leiomyoma of uterus, unspecified: Secondary | ICD-10-CM | POA: Diagnosis not present

## 2011-12-19 DIAGNOSIS — R5383 Other fatigue: Secondary | ICD-10-CM | POA: Diagnosis not present

## 2011-12-19 DIAGNOSIS — R05 Cough: Secondary | ICD-10-CM | POA: Diagnosis not present

## 2011-12-19 DIAGNOSIS — M279 Disease of jaws, unspecified: Secondary | ICD-10-CM | POA: Diagnosis not present

## 2011-12-19 DIAGNOSIS — N946 Dysmenorrhea, unspecified: Secondary | ICD-10-CM | POA: Diagnosis not present

## 2011-12-19 DIAGNOSIS — J039 Acute tonsillitis, unspecified: Secondary | ICD-10-CM | POA: Diagnosis not present

## 2011-12-19 DIAGNOSIS — R059 Cough, unspecified: Secondary | ICD-10-CM | POA: Diagnosis not present

## 2011-12-19 DIAGNOSIS — R5381 Other malaise: Secondary | ICD-10-CM | POA: Diagnosis not present

## 2011-12-19 DIAGNOSIS — E269 Hyperaldosteronism, unspecified: Secondary | ICD-10-CM | POA: Diagnosis not present

## 2011-12-19 DIAGNOSIS — G4730 Sleep apnea, unspecified: Secondary | ICD-10-CM | POA: Insufficient documentation

## 2011-12-19 DIAGNOSIS — N949 Unspecified condition associated with female genital organs and menstrual cycle: Secondary | ICD-10-CM | POA: Diagnosis not present

## 2011-12-19 DIAGNOSIS — R070 Pain in throat: Secondary | ICD-10-CM | POA: Diagnosis not present

## 2011-12-19 DIAGNOSIS — J029 Acute pharyngitis, unspecified: Secondary | ICD-10-CM | POA: Diagnosis not present

## 2011-12-19 NOTE — ED Notes (Signed)
Pt c/o right side of face pain and "hot". Pt also c/o throat soreness.

## 2011-12-19 NOTE — Discharge Instructions (Signed)
Patient advised to use sour drops and follow up with her ent doctor if symptoms continue or worsen.
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
## 2011-12-20 DIAGNOSIS — J343 Hypertrophy of nasal turbinates: Secondary | ICD-10-CM | POA: Diagnosis not present

## 2011-12-20 DIAGNOSIS — E269 Hyperaldosteronism, unspecified: Secondary | ICD-10-CM | POA: Diagnosis not present

## 2011-12-20 DIAGNOSIS — N949 Unspecified condition associated with female genital organs and menstrual cycle: Secondary | ICD-10-CM | POA: Diagnosis not present

## 2011-12-20 DIAGNOSIS — Z881 Allergy status to other antibiotic agents status: Secondary | ICD-10-CM | POA: Diagnosis not present

## 2011-12-20 DIAGNOSIS — R109 Unspecified abdominal pain: Secondary | ICD-10-CM | POA: Diagnosis not present

## 2011-12-20 DIAGNOSIS — M889 Osteitis deformans of unspecified bone: Secondary | ICD-10-CM | POA: Diagnosis not present

## 2011-12-20 DIAGNOSIS — Z882 Allergy status to sulfonamides status: Secondary | ICD-10-CM | POA: Diagnosis not present

## 2011-12-20 DIAGNOSIS — Z886 Allergy status to analgesic agent status: Secondary | ICD-10-CM | POA: Diagnosis not present

## 2011-12-20 DIAGNOSIS — N946 Dysmenorrhea, unspecified: Secondary | ICD-10-CM | POA: Diagnosis not present

## 2011-12-20 DIAGNOSIS — Z880 Allergy status to penicillin: Secondary | ICD-10-CM | POA: Diagnosis not present

## 2011-12-20 DIAGNOSIS — D259 Leiomyoma of uterus, unspecified: Secondary | ICD-10-CM | POA: Diagnosis not present

## 2011-12-20 DIAGNOSIS — N72 Inflammatory disease of cervix uteri: Secondary | ICD-10-CM | POA: Diagnosis not present

## 2011-12-20 DIAGNOSIS — R070 Pain in throat: Secondary | ICD-10-CM | POA: Diagnosis not present

## 2011-12-20 DIAGNOSIS — Z888 Allergy status to other drugs, medicaments and biological substances status: Secondary | ICD-10-CM | POA: Diagnosis not present

## 2011-12-20 DIAGNOSIS — Z79899 Other long term (current) drug therapy: Secondary | ICD-10-CM | POA: Diagnosis not present

## 2011-12-21 ENCOUNTER — Encounter (HOSPITAL_COMMUNITY): Payer: Self-pay | Admitting: Emergency Medicine

## 2011-12-21 ENCOUNTER — Emergency Department (HOSPITAL_COMMUNITY)
Admission: EM | Admit: 2011-12-21 | Discharge: 2011-12-21 | Disposition: A | Discharge status: Home / Self Care | Payer: Medicare Other | Attending: Emergency Medicine | Admitting: Emergency Medicine

## 2011-12-21 DIAGNOSIS — F4310 Post-traumatic stress disorder, unspecified: Secondary | ICD-10-CM | POA: Diagnosis not present

## 2011-12-21 DIAGNOSIS — M542 Cervicalgia: Secondary | ICD-10-CM | POA: Diagnosis not present

## 2011-12-21 DIAGNOSIS — J039 Acute tonsillitis, unspecified: Secondary | ICD-10-CM | POA: Diagnosis not present

## 2011-12-21 DIAGNOSIS — E785 Hyperlipidemia, unspecified: Secondary | ICD-10-CM | POA: Diagnosis not present

## 2011-12-21 DIAGNOSIS — R1310 Dysphagia, unspecified: Secondary | ICD-10-CM | POA: Diagnosis not present

## 2011-12-21 DIAGNOSIS — R070 Pain in throat: Secondary | ICD-10-CM | POA: Diagnosis not present

## 2011-12-21 DIAGNOSIS — G894 Chronic pain syndrome: Secondary | ICD-10-CM | POA: Diagnosis not present

## 2011-12-21 DIAGNOSIS — Z79899 Other long term (current) drug therapy: Secondary | ICD-10-CM | POA: Diagnosis not present

## 2011-12-21 DIAGNOSIS — J45909 Unspecified asthma, uncomplicated: Secondary | ICD-10-CM | POA: Diagnosis not present

## 2011-12-21 DIAGNOSIS — F339 Major depressive disorder, recurrent, unspecified: Secondary | ICD-10-CM | POA: Diagnosis not present

## 2011-12-21 DIAGNOSIS — J029 Acute pharyngitis, unspecified: Secondary | ICD-10-CM | POA: Diagnosis not present

## 2011-12-21 DIAGNOSIS — J358 Other chronic diseases of tonsils and adenoids: Secondary | ICD-10-CM

## 2011-12-21 DIAGNOSIS — I10 Essential (primary) hypertension: Secondary | ICD-10-CM | POA: Insufficient documentation

## 2011-12-21 NOTE — Discharge Instructions (Signed)
You were seen and evaluated today for your sensation of swelling to the right neck and throat area. At this time your providers do not feel your symptoms cause from any emergent condition. Please continue to followup with your primary care providers, ENT specialist or GI specialist. Return to emergency room if you have any worsening symptoms, difficulty swallowing or difficulty breathing.   RESOURCE GUIDE  Dental Problems  Patients with Medicaid: [facility] [phone]                                           [phone] [address] [employer] Phone:  [phone]                                                  Phone:  [phone]  If unable to pay or uninsured, contact:  [facility] or [facility] to become qualified for the adult dental clinic.  Chronic Pain Problems Contact [facility]  [phone] Patients need to be referred by their primary care doctor.  Insufficient Money for Medicine Contact [employer]:  call "211" or [facility] [phone].  No Primary Care Doctor Call [facility]  [phone] Other agencies that provide inexpensive medical care    [facility]  [phone]    [facility]  [phone]    [facility]  [phone]    [facility]  [phone]    [facility]  [phone]    [facility]  [phone]  Psychological Services [facility]  [phone] [facility]  [phone] [facility]   [phone] (emergency services [phone])  Substance Abuse Resources [facility]  [phone] [facility] [phone] [facility] [phone] Floydene Flock [phone] Residential & Outpatient Substance Abuse Program  [phone]  Abuse/Neglect [facility] Child Abuse Hotline [phone] [facility] Child Abuse Hotline [phone] (After Hours)  Emergency  Shelter [facility] [phone]  M[facility] [place] [facility] [place] [phone] [facility] [phone]  MRSA Hotline #:   234 607 1771    [facility] Resources  [facility]     [employer]                          [facility] Dept. [address] [place]                       [address]      [address]  [place]                                                Cristobal Goldmann Phone:  [phone]  Phone:  [phone]                 Phone:  [phone]  [place] [facility] Phone:  [phone]  [place] Child Abuse Hotline [phone]) 342-1394 [phone]) [phone] (After Hours)   
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
## 2011-12-21 NOTE — ED Notes (Addendum)
Pt reports was having swelling in R side of jaw, having difficulty swallowing; denies teeth pain; reports pain in R side of jaw and R side of neck; reports having phlegm- dark white- symptoms have been going on for a few days; saw ENT yesterday and was given mouth wash; reports some SOB; pt has hoarse voice; airway attack, no swelling noted to inside of throat; reports fevers

## 2011-12-24 NOTE — ED Provider Notes (Signed)
Medical screening examination/treatment/procedure(s) were performed by non-physician practitioner and as supervising physician I was immediately available for consultation/collaboration.   Jaxten Brosh L Naira Standiford, MD 12/24/11 0719 

## 2011-12-25 ENCOUNTER — Telehealth: Payer: Self-pay | Admitting: *Deleted

## 2011-12-25 DIAGNOSIS — D649 Anemia, unspecified: Secondary | ICD-10-CM

## 2011-12-25 LAB — IRON AND TIBC
%SAT: 15 % — ABNORMAL LOW (ref 20–55)
Iron: 45 ug/dL (ref 42–145)
TIBC: 309 ug/dL (ref 250–470)
UIBC: 264 ug/dL (ref 125–400)

## 2011-12-25 LAB — HEMOGLOBIN: Hemoglobin: 13.3 g/dL (ref 12.0–15.0)

## 2011-12-25 LAB — FERRITIN: Ferritin: 55 ng/mL (ref 10–291)

## 2011-12-25 NOTE — Telephone Encounter (Signed)
Labs ordered and sent to [facility] in [facility] on [address]

## 2011-12-25 NOTE — Telephone Encounter (Signed)
Ok to order hemoglobin, ferritin, and TIBC. Use anemia as dx.

## 2011-12-25 NOTE — Telephone Encounter (Signed)
Pt calls and states she is fatigued, tired and would like to have her TIBC drawn to check iron levels. Needs lab to be sent to [facility]

## 2011-12-26 DIAGNOSIS — G894 Chronic pain syndrome: Secondary | ICD-10-CM | POA: Diagnosis not present

## 2011-12-26 DIAGNOSIS — F339 Major depressive disorder, recurrent, unspecified: Secondary | ICD-10-CM | POA: Diagnosis not present

## 2011-12-26 DIAGNOSIS — IMO0001 Reserved for inherently not codable concepts without codable children: Secondary | ICD-10-CM | POA: Diagnosis not present

## 2011-12-26 DIAGNOSIS — M2580 Other specified joint disorders, unspecified joint: Secondary | ICD-10-CM | POA: Diagnosis not present

## 2011-12-27 DIAGNOSIS — F339 Major depressive disorder, recurrent, unspecified: Secondary | ICD-10-CM | POA: Diagnosis not present

## 2011-12-29 DIAGNOSIS — J310 Chronic rhinitis: Secondary | ICD-10-CM | POA: Diagnosis not present

## 2011-12-29 DIAGNOSIS — F411 Generalized anxiety disorder: Secondary | ICD-10-CM | POA: Diagnosis not present

## 2011-12-29 DIAGNOSIS — J343 Hypertrophy of nasal turbinates: Secondary | ICD-10-CM | POA: Diagnosis not present

## 2012-01-01 DIAGNOSIS — R110 Nausea: Secondary | ICD-10-CM | POA: Diagnosis not present

## 2012-01-01 DIAGNOSIS — R1013 Epigastric pain: Secondary | ICD-10-CM | POA: Diagnosis not present

## 2012-01-01 DIAGNOSIS — F411 Generalized anxiety disorder: Secondary | ICD-10-CM | POA: Diagnosis not present

## 2012-01-01 DIAGNOSIS — R112 Nausea with vomiting, unspecified: Secondary | ICD-10-CM | POA: Diagnosis not present

## 2012-01-01 DIAGNOSIS — R1084 Generalized abdominal pain: Secondary | ICD-10-CM | POA: Diagnosis not present

## 2012-01-01 DIAGNOSIS — G894 Chronic pain syndrome: Secondary | ICD-10-CM | POA: Diagnosis not present

## 2012-01-01 DIAGNOSIS — Z888 Allergy status to other drugs, medicaments and biological substances status: Secondary | ICD-10-CM | POA: Diagnosis not present

## 2012-01-01 DIAGNOSIS — Z79899 Other long term (current) drug therapy: Secondary | ICD-10-CM | POA: Diagnosis not present

## 2012-01-01 DIAGNOSIS — I10 Essential (primary) hypertension: Secondary | ICD-10-CM | POA: Diagnosis not present

## 2012-01-02 ENCOUNTER — Ambulatory Visit (INDEPENDENT_AMBULATORY_CARE_PROVIDER_SITE_OTHER): Payer: Medicare Other | Admitting: Family Medicine

## 2012-01-02 ENCOUNTER — Ambulatory Visit: Payer: Medicare Other | Admitting: Family Medicine

## 2012-01-02 ENCOUNTER — Encounter: Payer: Self-pay | Admitting: Family Medicine

## 2012-01-02 VITALS — BP 130/78 | HR 103 | Ht 62.0 in | Wt 182.0 lb

## 2012-01-02 DIAGNOSIS — N926 Irregular menstruation, unspecified: Secondary | ICD-10-CM | POA: Diagnosis not present

## 2012-01-02 DIAGNOSIS — D259 Leiomyoma of uterus, unspecified: Secondary | ICD-10-CM | POA: Diagnosis not present

## 2012-01-02 DIAGNOSIS — E269 Hyperaldosteronism, unspecified: Secondary | ICD-10-CM

## 2012-01-02 DIAGNOSIS — R55 Syncope and collapse: Secondary | ICD-10-CM

## 2012-01-02 DIAGNOSIS — L609 Nail disorder, unspecified: Secondary | ICD-10-CM

## 2012-01-02 DIAGNOSIS — R110 Nausea: Secondary | ICD-10-CM

## 2012-01-02 DIAGNOSIS — Z113 Encounter for screening for infections with a predominantly sexual mode of transmission: Secondary | ICD-10-CM | POA: Diagnosis not present

## 2012-01-02 DIAGNOSIS — B351 Tinea unguium: Secondary | ICD-10-CM

## 2012-01-02 DIAGNOSIS — L608 Other nail disorders: Secondary | ICD-10-CM

## 2012-01-02 LAB — FOLLICLE STIMULATING HORMONE: FSH: 17.2 m[IU]/mL

## 2012-01-02 LAB — ESTRADIOL: Estradiol: 63.9 pg/mL

## 2012-01-02 LAB — RPR

## 2012-01-02 LAB — LUTEINIZING HORMONE: LH: 16 m[IU]/mL

## 2012-01-02 LAB — PROGESTERONE: Progesterone: 2.1 ng/mL

## 2012-01-02 NOTE — Patient Instructions (Signed)
We will call you with your lab results. If you don't here from us in about a week then please give us a call at [phone].  

## 2012-01-02 NOTE — Progress Notes (Signed)
Subjective:    Patient ID: Jennifer [name], female    DOB: [birth year], 46 y.o.   MRN: [id number]  HPI Felt overhead and then felt nauseated and felt like was going to pass out.  No vomiting.  Didn't eat that much that day. Went to ED and had abd [place] which per her record is negative.  She's had moments of hot flashes and feeling slightly nauseated in the past but never to this degree. She does have a history of a hiatal hernia as well as reflux that she felt this was not reflux related. Her blood pressure was initially high but came back down to normal during her stay in the ED.    Her endocrine is referring her to [facility] for further evaluation for hyperaldosteronism.   Had all her naisl removed bc of fungus and now has white discoloration on the nailbed.  She wants and if there's anything she can do about this. She's tried soaking them in a Clorox solution. She's also tried just grabbing at them and she's also tried moisturizing them. They're not otherwise bothersome or painful she just does not like the look of them.  Would like to be checked with STD.  She currently has the same sexual partner but says she may be entering into a new part of Her life and just would like to have blood work to rule out STDs. She's also had more irregular periods lately. She also has a history of uterine fibroids and plans on following up with her GYN in the next couple months. She is interested in possibly getting a hysterectomy.   Review of Systems Past Surgical History  Procedure Date  . Breast lumpectomy     right, benign  . Appendectomy   . Tubal ligation   . Esophageal dilation   . Cardiac catheterization   . Vulvectomy 2012    partial--Dr [name]  . Heart attack     family history only       Objective:   Physical Exam  Constitutional: She is oriented to person, place, and time. She appears well-developed and well-nourished.  HENT:  Head: Normocephalic and atraumatic.  Cardiovascular: Normal  rate, regular rhythm and normal heart sounds.   Pulmonary/Chest: Effort normal and breath sounds normal.  Musculoskeletal: She exhibits no edema.  Neurological: She is alert and oriented to person, place, and time.  Skin: Skin is warm and dry.       When her nails have been removed at the base she has some whitish colored keratin buildup.  Psychiatric: She has a normal mood and affect. Her behavior is normal.          Assessment & Plan:  Near syncopal event, Nauseated - Seems to have resolved.  She has not had another episode. I still think this may be reflux or GERD related. Evidently cholecystitis was ruled out. Though she has been having hot flashes and we could certainly check her estradiol, progesterone, FSH and LH today. She does plan on seeing her GYN in the next couple of months. She does have a history of Paget's disease.  She also had hardly eaten that day so she may have had a hypoglycemic event. Though I am sure they would have ruled out  abnormal glucose during her ED visit.  STD testing.- will get full testing today. Check for gonorrhea, Chlamydia, HIV, syphilis, herpes simplex an acute hepatitis..    Nail deformity- Told her that what she is seeing is completely normal for  having the nails permanently removed. There is really no further treatment warranted. She can certainly keep it more moisturize it doesn't look as dry.

## 2012-01-03 ENCOUNTER — Ambulatory Visit: Payer: Medicare Other | Admitting: Obstetrics & Gynecology

## 2012-01-03 DIAGNOSIS — F339 Major depressive disorder, recurrent, unspecified: Secondary | ICD-10-CM | POA: Diagnosis not present

## 2012-01-03 DIAGNOSIS — N949 Unspecified condition associated with female genital organs and menstrual cycle: Secondary | ICD-10-CM

## 2012-01-03 LAB — HEPATITIS PANEL, ACUTE
HCV Ab: NEGATIVE
Hep A IgM: NEGATIVE
Hep B C IgM: NEGATIVE
Hepatitis B Surface Ag: NEGATIVE

## 2012-01-03 LAB — HSV(HERPES SIMPLEX VRS) I + II AB-IGG
HSV 1 Glycoprotein G Ab, IgG: <0.1 IV
HSV 2 Glycoprotein G Ab, IgG: <0.1 IV

## 2012-01-03 LAB — GC/CHLAMYDIA PROBE AMP, URINE
Chlamydia, Swab/Urine, PCR: NEGATIVE
GC Probe Amp, Urine: NEGATIVE

## 2012-01-04 ENCOUNTER — Emergency Department (HOSPITAL_BASED_OUTPATIENT_CLINIC_OR_DEPARTMENT_OTHER)
Admission: EM | Admit: 2012-01-04 | Discharge: 2012-01-04 | Disposition: A | Discharge status: Home / Self Care | Payer: Medicare Other | Attending: Emergency Medicine | Admitting: Emergency Medicine

## 2012-01-04 ENCOUNTER — Encounter (HOSPITAL_BASED_OUTPATIENT_CLINIC_OR_DEPARTMENT_OTHER): Payer: Self-pay | Admitting: *Deleted

## 2012-01-04 ENCOUNTER — Encounter: Payer: Self-pay | Admitting: Family Medicine

## 2012-01-04 ENCOUNTER — Ambulatory Visit (INDEPENDENT_AMBULATORY_CARE_PROVIDER_SITE_OTHER): Payer: Medicare Other | Admitting: Family Medicine

## 2012-01-04 VITALS — BP 140/88 | HR 73 | Ht 62.0 in | Wt 180.0 lb

## 2012-01-04 DIAGNOSIS — R569 Unspecified convulsions: Secondary | ICD-10-CM | POA: Insufficient documentation

## 2012-01-04 DIAGNOSIS — J45909 Unspecified asthma, uncomplicated: Secondary | ICD-10-CM | POA: Insufficient documentation

## 2012-01-04 DIAGNOSIS — R42 Dizziness and giddiness: Secondary | ICD-10-CM | POA: Insufficient documentation

## 2012-01-04 DIAGNOSIS — K2270 Barrett's esophagus without dysplasia: Secondary | ICD-10-CM

## 2012-01-04 DIAGNOSIS — E269 Hyperaldosteronism, unspecified: Secondary | ICD-10-CM | POA: Diagnosis not present

## 2012-01-04 DIAGNOSIS — R259 Unspecified abnormal involuntary movements: Secondary | ICD-10-CM | POA: Diagnosis not present

## 2012-01-04 DIAGNOSIS — R5383 Other fatigue: Secondary | ICD-10-CM | POA: Diagnosis not present

## 2012-01-04 DIAGNOSIS — R110 Nausea: Secondary | ICD-10-CM

## 2012-01-04 DIAGNOSIS — R251 Tremor, unspecified: Secondary | ICD-10-CM

## 2012-01-04 DIAGNOSIS — E785 Hyperlipidemia, unspecified: Secondary | ICD-10-CM | POA: Insufficient documentation

## 2012-01-04 DIAGNOSIS — I10 Essential (primary) hypertension: Secondary | ICD-10-CM | POA: Insufficient documentation

## 2012-01-04 DIAGNOSIS — G4730 Sleep apnea, unspecified: Secondary | ICD-10-CM | POA: Diagnosis not present

## 2012-01-04 DIAGNOSIS — R030 Elevated blood-pressure reading, without diagnosis of hypertension: Secondary | ICD-10-CM | POA: Diagnosis not present

## 2012-01-04 DIAGNOSIS — R51 Headache: Secondary | ICD-10-CM | POA: Insufficient documentation

## 2012-01-04 DIAGNOSIS — G4700 Insomnia, unspecified: Secondary | ICD-10-CM

## 2012-01-04 DIAGNOSIS — R5381 Other malaise: Secondary | ICD-10-CM | POA: Diagnosis not present

## 2012-01-04 DIAGNOSIS — K219 Gastro-esophageal reflux disease without esophagitis: Secondary | ICD-10-CM | POA: Insufficient documentation

## 2012-01-04 DIAGNOSIS — IMO0001 Reserved for inherently not codable concepts without codable children: Secondary | ICD-10-CM

## 2012-01-04 LAB — URINALYSIS, ROUTINE W REFLEX MICROSCOPIC
Bilirubin Urine: NEGATIVE
Glucose, UA: NEGATIVE mg/dL
Hgb urine dipstick: NEGATIVE
Ketones, ur: 15 mg/dL — AB
Leukocytes, UA: NEGATIVE
Nitrite: NEGATIVE
Protein, ur: NEGATIVE mg/dL
Specific Gravity, Urine: 1.029 (ref 1.005–1.030)
Urobilinogen, UA: 0.2 mg/dL (ref 0.0–1.0)
pH: 6 (ref 5.0–8.0)

## 2012-01-04 LAB — HIV ANTIBODY (ROUTINE TESTING W REFLEX): HIV: NONREACTIVE

## 2012-01-04 LAB — GLUCOSE, CAPILLARY: Glucose-Capillary: 72 mg/dL (ref 70–99)

## 2012-01-04 NOTE — Patient Instructions (Addendum)
Can try melatonin or valerian root for sleep.    Insomnia Insomnia is frequent trouble falling and/or staying asleep. Insomnia can be a long term problem or a short term problem. Both are common. Insomnia can be a short term problem when the wakefulness is related to a certain stress or worry. Long term insomnia is often related to ongoing stress during waking hours and/or poor sleeping habits. Overtime, sleep deprivation itself can make the problem worse. Every little thing feels more severe because you are overtired and your ability to cope is decreased. CAUSES    Stress, anxiety, and depression.   Poor sleeping habits.   Distractions such as TV in the bedroom.   Naps close to bedtime.   Engaging in emotionally charged conversations before bed.   Technical reading before sleep.   Alcohol and other sedatives. They may make the problem worse. They can hurt normal sleep patterns and normal dream activity.   Stimulants such as caffeine for several hours prior to bedtime.   Pain syndromes and shortness of breath can cause insomnia.   Exercise late at night.   Changing time zones may cause sleeping problems (jet lag).  It is sometimes helpful to have someone observe your sleeping patterns. They should look for periods of not breathing during the night (sleep apnea). They should also look to see how long those periods last. If you live alone or observers are uncertain, you can also be observed at a sleep clinic where your sleep patterns will be professionally monitored. Sleep apnea requires a checkup and treatment. Give your caregivers your medical history. Give your caregivers observations your family has made about your sleep.   SYMPTOMS    Not feeling rested in the morning.   Anxiety and restlessness at bedtime.   Difficulty falling and staying asleep.  TREATMENT    Your caregiver may prescribe treatment for an underlying medical disorders. Your caregiver can give advice or help if  you are using alcohol or other drugs for self-medication. Treatment of underlying problems will usually eliminate insomnia problems.   Medications can be prescribed for short time use. They are generally not recommended for lengthy use.   Over-the-counter sleep medicines are not recommended for lengthy use. They can be habit forming.   You can promote easier sleeping by making lifestyle changes such as:   Using relaxation techniques that help with breathing and reduce muscle tension.   Exercising earlier in the day.   Changing your diet and the time of your last meal. No night time snacks.   Establish a regular time to go to bed.   Counseling can help with stressful problems and worry.   Soothing music and white noise may be helpful if there are background noises you cannot remove.   Stop tedious detailed work at least one hour before bedtime.  HOME CARE INSTRUCTIONS    Keep a diary. Inform your caregiver about your progress. This includes any medication side effects. See your caregiver regularly. Take note of:   Times when you are asleep.   Times when you are awake during the night.   The quality of your sleep.   How you feel the next day.  This information will help your caregiver care for you.  Get out of bed if you are still awake after 15 minutes. Read or do some quiet activity. Keep the lights down. Wait until you feel sleepy and go back to bed.   Keep regular sleeping and waking hours. Avoid naps.  Exercise regularly.   Avoid distractions at bedtime. Distractions include watching television or engaging in any intense or detailed activity like attempting to balance the household checkbook.   Develop a bedtime ritual. Keep a familiar routine of bathing, brushing your teeth, climbing into bed at the same time each night, listening to soothing music. Routines increase the success of falling to sleep faster.   Use relaxation techniques. This can be using breathing and  muscle tension release routines. It can also include visualizing peaceful scenes. You can also help control troubling or intruding thoughts by keeping your mind occupied with boring or repetitive thoughts like the old concept of counting sheep. You can make it more creative like imagining planting one beautiful flower after another in your backyard garden.   During your day, work to eliminate stress. When this is not possible use some of the previous suggestions to help reduce the anxiety that accompanies stressful situations.  MAKE SURE YOU:    Understand these instructions.   Will watch your condition.   Will get help right away if you are not doing well or get worse.  Document Released: 07/28/2000 Document Revised: 07/20/2011 Document Reviewed: 08/28/2007 [facility] Patient Information 2012 [place], [place].
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
## 2012-01-04 NOTE — ED Notes (Signed)
Pt amb to room 11 with quick steady gait in nad. Pt states she felt shaky this morning, saw her pcp and had her hormone levels checked. Pt states she still feels shaky. Denies any pain or other c/o. Pt has apt at [facility] tomorrow also.

## 2012-01-04 NOTE — Progress Notes (Signed)
Subjective:    Patient ID: Jennifer [name], female    DOB: [birth year], 46 y.o.   MRN: [id number]  HPI Says had a left nosebleed this AM.  She hasn't been sleeping well.  Says having episodes of over tired and shakes.  Says checke her sugar when this happened and it was 89. Ate a chocolate bar but didn't feel much better. She thought the 58 was low.   She has appt with gyn next month.  She is still thinking about having a hysterectomy because of pain with her fibroids.  Here to review labwork.  We checked her hormones. See previous note.    Barretts esophagus- She really liked the prevacid solutab, but then no longer make them.  Hasn't been in a PPI for awhile.  She has been feeling nauseated but she denies any heartburn type symptoms. She sometimes will get flushed and sweaty with the nausea. She is worried that something is going on.   Review of Systems     Objective:   Physical Exam  Constitutional: She is oriented to person, place, and time. She appears well-developed and well-nourished.  HENT:  Head: Normocephalic and atraumatic.  Right Ear: External ear normal.  Left Ear: External ear normal.  Nose: Nose normal.  Mouth/Throat: Oropharynx is clear and moist.       TMs and canals are clear. Nasal passages are clear  Eyes: Conjunctivae and EOM are normal. Pupils are equal, round, and reactive to light.  Neck: Neck supple. No thyromegaly present.  Cardiovascular: Normal rate, regular rhythm and normal heart sounds.   Pulmonary/Chest: Effort normal and breath sounds normal. She has no wheezes.  Lymphadenopathy:    She has no cervical adenopathy.  Neurological: She is alert and oriented to person, place, and time.  Skin: Skin is warm and dry.  Psychiatric: She has a normal mood and affect.          Assessment & Plan:  Elevated BP - her blood pressure is slightly elevated today. It is a little bit better on the recheck. Pressure swings from completely normal to occasionally  being very high. She is working with her endocrinologist on this. At this point in time I think she can continue to monitor it. She says she was rushing to get here because she woke up late. She is not currently taking any medication for her blood pressure.  Insomnia - given a handout on sleep hygiene. Also discussed a trial of over-the-counter melatonin or possibly valerian root. She's not currently interested in any prescription medications for sleep. I discussed with her how stress can certainly effect sleep.+ hx of anxiety.  She has been seeing her therapist regularly.  Barretts esophagus - Can try dexilant. Can open the capsules and place in applesauce.  Opening the capsule will affect the release of the drug, but I think she needs to be on some type of PPI. I strongly think that her nausea is related to her hiatal hernia and her reflux. She can certainly go back to taking the Pepcid a.c. dissolve tabs.  Hyperaldosteronism-she is being referred to [facility] for further evaluation and has that appointment I believe next month.  F/U in 2 weeks.   Shakes-unclear etiology though these could be coming from her anxiety and stress levels. I would really like for her to work on her sleep quality to see if this makes a big difference. Explained how lack of sleep can cause a lot of problems physically with the body. I  do not think that these are hypoglycemic events because she was able check her sugar the last time this happened and her sugar was 89. There we did discuss eating more regular meals along was not. She oftentimes will go all day without eating but maybe once or twice. Explained how this is not good for her blood sugars or for her liver. Also getting her periods under control with a PPI may actually improve her appetite so that she can be more consistently which may in turn improve his shaking that she's experiencing. Her hormones were normal so I do not think this has a hormonal cause except for  probably some relation to her hyperaldosteronism.

## 2012-01-04 NOTE — ED Provider Notes (Signed)
Medical screening examination/treatment/procedure(s) were performed by non-physician practitioner and as supervising physician I was immediately available for consultation/collaboration.   Rolan Bucco, MD 01/04/12 1436

## 2012-01-04 NOTE — Discharge Instructions (Signed)
Emotional Crisis Part of your problem today may be due to an emotional crisis. Emotional states can cause many different physical signs and symptoms. These may include:  Chest or stomach pain.   Fluttering heartbeat.   Passing out.   Breathing difficulty.   Headaches.   Trembling.   Hot or cold flashes.   Numbness.   Dizziness.   Unusual muscle pain or fatigue.   Insomnia.  When you have other medical problems, they are often made worse by emotional upsets. Emotional crises can increase your stress and anxiety. Finding ways to reduce your stress level can make you feel better. You will become more capable of dealing with these emotional states. Regular physical exercise such as walking can be very beneficial. Counseling or medicine to treat anxiety or depression may also be needed. See your caregiver if you have further problems or questions about your condition. Document Released: 07/31/2005 Document Revised: 07/20/2011 Document Reviewed: 01/15/2007 [facility] Patient Information 2012 [place], [place].

## 2012-01-05 DIAGNOSIS — E269 Hyperaldosteronism, unspecified: Secondary | ICD-10-CM | POA: Diagnosis not present

## 2012-01-09 DIAGNOSIS — Z886 Allergy status to analgesic agent status: Secondary | ICD-10-CM | POA: Diagnosis not present

## 2012-01-09 DIAGNOSIS — IMO0002 Reserved for concepts with insufficient information to code with codable children: Secondary | ICD-10-CM | POA: Diagnosis not present

## 2012-01-09 DIAGNOSIS — R002 Palpitations: Secondary | ICD-10-CM | POA: Diagnosis not present

## 2012-01-09 DIAGNOSIS — R0789 Other chest pain: Secondary | ICD-10-CM | POA: Diagnosis not present

## 2012-01-09 DIAGNOSIS — J45909 Unspecified asthma, uncomplicated: Secondary | ICD-10-CM | POA: Diagnosis not present

## 2012-01-09 DIAGNOSIS — M549 Dorsalgia, unspecified: Secondary | ICD-10-CM | POA: Diagnosis not present

## 2012-01-09 DIAGNOSIS — Z881 Allergy status to other antibiotic agents status: Secondary | ICD-10-CM | POA: Diagnosis not present

## 2012-01-09 DIAGNOSIS — F339 Major depressive disorder, recurrent, unspecified: Secondary | ICD-10-CM | POA: Diagnosis not present

## 2012-01-09 DIAGNOSIS — Z882 Allergy status to sulfonamides status: Secondary | ICD-10-CM | POA: Diagnosis not present

## 2012-01-09 DIAGNOSIS — Z79899 Other long term (current) drug therapy: Secondary | ICD-10-CM | POA: Diagnosis not present

## 2012-01-09 DIAGNOSIS — S2341XA Sprain of ribs, initial encounter: Secondary | ICD-10-CM | POA: Diagnosis not present

## 2012-01-09 DIAGNOSIS — Z884 Allergy status to anesthetic agent status: Secondary | ICD-10-CM | POA: Diagnosis not present

## 2012-01-09 DIAGNOSIS — I10 Essential (primary) hypertension: Secondary | ICD-10-CM | POA: Diagnosis not present

## 2012-01-09 DIAGNOSIS — R059 Cough, unspecified: Secondary | ICD-10-CM | POA: Diagnosis not present

## 2012-01-09 DIAGNOSIS — R079 Chest pain, unspecified: Secondary | ICD-10-CM | POA: Diagnosis not present

## 2012-01-10 DIAGNOSIS — S2239XA Fracture of one rib, unspecified side, initial encounter for closed fracture: Secondary | ICD-10-CM | POA: Diagnosis not present

## 2012-01-10 DIAGNOSIS — M549 Dorsalgia, unspecified: Secondary | ICD-10-CM | POA: Diagnosis not present

## 2012-01-10 DIAGNOSIS — R079 Chest pain, unspecified: Secondary | ICD-10-CM | POA: Diagnosis not present

## 2012-01-10 DIAGNOSIS — IMO0002 Reserved for concepts with insufficient information to code with codable children: Secondary | ICD-10-CM | POA: Diagnosis not present

## 2012-01-10 DIAGNOSIS — G894 Chronic pain syndrome: Secondary | ICD-10-CM | POA: Diagnosis not present

## 2012-01-10 DIAGNOSIS — S2341XA Sprain of ribs, initial encounter: Secondary | ICD-10-CM | POA: Diagnosis not present

## 2012-01-10 DIAGNOSIS — I10 Essential (primary) hypertension: Secondary | ICD-10-CM | POA: Diagnosis not present

## 2012-01-12 ENCOUNTER — Emergency Department (HOSPITAL_BASED_OUTPATIENT_CLINIC_OR_DEPARTMENT_OTHER): Payer: Medicare Other

## 2012-01-12 ENCOUNTER — Encounter (HOSPITAL_BASED_OUTPATIENT_CLINIC_OR_DEPARTMENT_OTHER): Payer: Self-pay | Admitting: *Deleted

## 2012-01-12 ENCOUNTER — Emergency Department (HOSPITAL_BASED_OUTPATIENT_CLINIC_OR_DEPARTMENT_OTHER)
Admission: EM | Admit: 2012-01-12 | Discharge: 2012-01-12 | Disposition: A | Discharge status: Home / Self Care | Payer: Medicare Other | Attending: Emergency Medicine | Admitting: Emergency Medicine

## 2012-01-12 ENCOUNTER — Emergency Department (HOSPITAL_COMMUNITY): Payer: Medicare Other

## 2012-01-12 DIAGNOSIS — F4310 Post-traumatic stress disorder, unspecified: Secondary | ICD-10-CM | POA: Insufficient documentation

## 2012-01-12 DIAGNOSIS — E785 Hyperlipidemia, unspecified: Secondary | ICD-10-CM | POA: Diagnosis not present

## 2012-01-12 DIAGNOSIS — K219 Gastro-esophageal reflux disease without esophagitis: Secondary | ICD-10-CM | POA: Diagnosis not present

## 2012-01-12 DIAGNOSIS — J45909 Unspecified asthma, uncomplicated: Secondary | ICD-10-CM | POA: Insufficient documentation

## 2012-01-12 DIAGNOSIS — R109 Unspecified abdominal pain: Secondary | ICD-10-CM

## 2012-01-12 DIAGNOSIS — G4730 Sleep apnea, unspecified: Secondary | ICD-10-CM | POA: Insufficient documentation

## 2012-01-12 DIAGNOSIS — I10 Essential (primary) hypertension: Secondary | ICD-10-CM | POA: Diagnosis not present

## 2012-01-12 DIAGNOSIS — R1013 Epigastric pain: Secondary | ICD-10-CM | POA: Diagnosis not present

## 2012-01-12 DIAGNOSIS — J309 Allergic rhinitis, unspecified: Secondary | ICD-10-CM | POA: Diagnosis not present

## 2012-01-12 DIAGNOSIS — R1011 Right upper quadrant pain: Secondary | ICD-10-CM | POA: Insufficient documentation

## 2012-01-12 LAB — CBC
HCT: 37 % (ref 36.0–46.0)
Hemoglobin: 12.8 g/dL (ref 12.0–15.0)
MCH: 29.5 pg (ref 26.0–34.0)
MCHC: 34.6 g/dL (ref 30.0–36.0)
MCV: 85.3 fL (ref 78.0–100.0)
Platelets: 234 10*3/uL (ref 150–400)
RBC: 4.34 MIL/uL (ref 3.87–5.11)
RDW: 13.3 % (ref 11.5–15.5)
WBC: 6.5 10*3/uL (ref 4.0–10.5)

## 2012-01-12 LAB — COMPREHENSIVE METABOLIC PANEL
ALT: 13 U/L (ref 0–35)
AST: 12 U/L (ref 0–37)
Albumin: 3.9 g/dL (ref 3.5–5.2)
Alkaline Phosphatase: 57 U/L (ref 39–117)
BUN: 13 mg/dL (ref 6–23)
CO2: 21 mEq/L (ref 19–32)
Calcium: 9.3 mg/dL (ref 8.4–10.5)
Chloride: 107 mEq/L (ref 96–112)
Creatinine, Ser: 0.6 mg/dL (ref 0.50–1.10)
GFR calc Af Amer: >90 mL/min (ref >90)
GFR calc non Af Amer: >90 mL/min (ref >90)
Glucose, Bld: 106 mg/dL — ABNORMAL HIGH (ref 70–99)
Potassium: 3.4 mEq/L — ABNORMAL LOW (ref 3.5–5.1)
Sodium: 140 mEq/L (ref 135–145)
Total Bilirubin: 0.2 mg/dL — ABNORMAL LOW (ref 0.3–1.2)
Total Protein: 6.9 g/dL (ref 6.0–8.3)

## 2012-01-12 LAB — URINALYSIS, ROUTINE W REFLEX MICROSCOPIC
Bilirubin Urine: NEGATIVE
Glucose, UA: NEGATIVE mg/dL
Hgb urine dipstick: NEGATIVE
Ketones, ur: NEGATIVE mg/dL
Leukocytes, UA: NEGATIVE
Nitrite: NEGATIVE
Protein, ur: NEGATIVE mg/dL
Specific Gravity, Urine: 1.016 (ref 1.005–1.030)
Urobilinogen, UA: 0.2 mg/dL (ref 0.0–1.0)
pH: 7 (ref 5.0–8.0)

## 2012-01-12 LAB — LIPASE, BLOOD: Lipase: 49 U/L (ref 11–59)

## 2012-01-12 LAB — PREGNANCY, URINE: Preg Test, Ur: NEGATIVE

## 2012-01-12 MED ORDER — ACETAMINOPHEN 160 MG/5ML PO SOLN
650.0000 mg | Freq: Once | ORAL | Status: AC
  Administered 2012-01-12: 650 mg via ORAL
  Filled 2012-01-12: qty 20.3

## 2012-01-12 MED ORDER — ONDANSETRON HCL 4 MG/2ML IJ SOLN
4.0000 mg | Freq: Once | INTRAMUSCULAR | Status: AC
  Administered 2012-01-12: 4 mg via INTRAVENOUS

## 2012-01-12 MED ORDER — SODIUM CHLORIDE 0.9 % IV BOLUS (SEPSIS)
1000.0000 mL | Freq: Once | INTRAVENOUS | Status: AC
  Administered 2012-01-12: 1000 mL via INTRAVENOUS

## 2012-01-12 MED ORDER — ONDANSETRON 8 MG PO TBDP: 8.0000 mg | ORAL_TABLET | Freq: Three times a day (TID) | ORAL | Status: AC | PRN

## 2012-01-12 MED ORDER — ONDANSETRON HCL 4 MG/2ML IJ SOLN
INTRAMUSCULAR | Status: AC
  Filled 2012-01-12: qty 2

## 2012-01-12 MED ORDER — LORAZEPAM 2 MG/ML IJ SOLN: 1.0000 mg | Freq: Once | INTRAMUSCULAR | Status: DC

## 2012-01-12 NOTE — ED Notes (Signed)
Pt c/o sudden onset of abd pain which radiates to back
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
## 2012-01-12 NOTE — Discharge Instructions (Signed)
Return to the ED with any concerns including vomiting and not able to keep down liquids or antibiotics, worsening pain, fever/chills, or any other alarming symptoms  You should start the prilosec as recommended by your PMD and take zofran as needed for nausea

## 2012-01-14 DIAGNOSIS — M79609 Pain in unspecified limb: Secondary | ICD-10-CM | POA: Diagnosis not present

## 2012-01-14 DIAGNOSIS — R609 Edema, unspecified: Secondary | ICD-10-CM | POA: Diagnosis not present

## 2012-01-14 DIAGNOSIS — M7989 Other specified soft tissue disorders: Secondary | ICD-10-CM | POA: Diagnosis not present

## 2012-01-14 DIAGNOSIS — Z79899 Other long term (current) drug therapy: Secondary | ICD-10-CM | POA: Diagnosis not present

## 2012-01-14 DIAGNOSIS — T82898A Other specified complication of vascular prosthetic devices, implants and grafts, initial encounter: Secondary | ICD-10-CM | POA: Diagnosis not present

## 2012-01-14 DIAGNOSIS — I10 Essential (primary) hypertension: Secondary | ICD-10-CM | POA: Diagnosis not present

## 2012-01-16 DIAGNOSIS — K7689 Other specified diseases of liver: Secondary | ICD-10-CM | POA: Diagnosis not present

## 2012-01-16 DIAGNOSIS — F339 Major depressive disorder, recurrent, unspecified: Secondary | ICD-10-CM | POA: Diagnosis not present

## 2012-01-16 DIAGNOSIS — I10 Essential (primary) hypertension: Secondary | ICD-10-CM | POA: Diagnosis not present

## 2012-01-16 DIAGNOSIS — R1011 Right upper quadrant pain: Secondary | ICD-10-CM | POA: Diagnosis not present

## 2012-01-16 DIAGNOSIS — R42 Dizziness and giddiness: Secondary | ICD-10-CM | POA: Diagnosis not present

## 2012-01-16 DIAGNOSIS — R141 Gas pain: Secondary | ICD-10-CM | POA: Diagnosis not present

## 2012-01-16 DIAGNOSIS — Z79899 Other long term (current) drug therapy: Secondary | ICD-10-CM | POA: Diagnosis not present

## 2012-01-16 DIAGNOSIS — R142 Eructation: Secondary | ICD-10-CM | POA: Diagnosis not present

## 2012-01-17 DIAGNOSIS — R7309 Other abnormal glucose: Secondary | ICD-10-CM | POA: Diagnosis not present

## 2012-01-17 DIAGNOSIS — R5383 Other fatigue: Secondary | ICD-10-CM | POA: Diagnosis not present

## 2012-01-17 DIAGNOSIS — E269 Hyperaldosteronism, unspecified: Secondary | ICD-10-CM | POA: Diagnosis not present

## 2012-01-17 DIAGNOSIS — I10 Essential (primary) hypertension: Secondary | ICD-10-CM | POA: Diagnosis not present

## 2012-01-17 DIAGNOSIS — R5381 Other malaise: Secondary | ICD-10-CM | POA: Diagnosis not present

## 2012-01-18 ENCOUNTER — Encounter: Payer: Medicare Other | Admitting: Family Medicine

## 2012-01-18 DIAGNOSIS — R1313 Dysphagia, pharyngeal phase: Secondary | ICD-10-CM | POA: Diagnosis not present

## 2012-01-18 DIAGNOSIS — R630 Anorexia: Secondary | ICD-10-CM | POA: Diagnosis not present

## 2012-01-18 DIAGNOSIS — R1011 Right upper quadrant pain: Secondary | ICD-10-CM | POA: Diagnosis not present

## 2012-01-18 DIAGNOSIS — R10813 Right lower quadrant abdominal tenderness: Secondary | ICD-10-CM | POA: Diagnosis not present

## 2012-01-19 DIAGNOSIS — I10 Essential (primary) hypertension: Secondary | ICD-10-CM | POA: Diagnosis not present

## 2012-01-19 DIAGNOSIS — R1013 Epigastric pain: Secondary | ICD-10-CM | POA: Diagnosis not present

## 2012-01-19 DIAGNOSIS — R110 Nausea: Secondary | ICD-10-CM | POA: Diagnosis not present

## 2012-01-19 DIAGNOSIS — Z79899 Other long term (current) drug therapy: Secondary | ICD-10-CM | POA: Diagnosis not present

## 2012-01-19 DIAGNOSIS — D509 Iron deficiency anemia, unspecified: Secondary | ICD-10-CM | POA: Diagnosis not present

## 2012-01-23 ENCOUNTER — Emergency Department (HOSPITAL_BASED_OUTPATIENT_CLINIC_OR_DEPARTMENT_OTHER): Payer: Medicare Other

## 2012-01-23 ENCOUNTER — Other Ambulatory Visit: Payer: Self-pay

## 2012-01-23 ENCOUNTER — Emergency Department (HOSPITAL_BASED_OUTPATIENT_CLINIC_OR_DEPARTMENT_OTHER)
Admission: EM | Admit: 2012-01-23 | Discharge: 2012-01-23 | Disposition: A | Discharge status: Home / Self Care | Payer: Medicare Other | Attending: Emergency Medicine | Admitting: Emergency Medicine

## 2012-01-23 ENCOUNTER — Encounter (HOSPITAL_BASED_OUTPATIENT_CLINIC_OR_DEPARTMENT_OTHER): Payer: Self-pay | Admitting: Emergency Medicine

## 2012-01-23 DIAGNOSIS — R079 Chest pain, unspecified: Secondary | ICD-10-CM | POA: Diagnosis not present

## 2012-01-23 DIAGNOSIS — K219 Gastro-esophageal reflux disease without esophagitis: Secondary | ICD-10-CM | POA: Diagnosis not present

## 2012-01-23 DIAGNOSIS — Z79899 Other long term (current) drug therapy: Secondary | ICD-10-CM | POA: Insufficient documentation

## 2012-01-23 DIAGNOSIS — R000 Tachycardia, unspecified: Secondary | ICD-10-CM | POA: Diagnosis not present

## 2012-01-23 DIAGNOSIS — I10 Essential (primary) hypertension: Secondary | ICD-10-CM | POA: Diagnosis not present

## 2012-01-23 DIAGNOSIS — Z87891 Personal history of nicotine dependence: Secondary | ICD-10-CM | POA: Diagnosis not present

## 2012-01-23 DIAGNOSIS — R197 Diarrhea, unspecified: Secondary | ICD-10-CM | POA: Insufficient documentation

## 2012-01-23 DIAGNOSIS — J45909 Unspecified asthma, uncomplicated: Secondary | ICD-10-CM | POA: Diagnosis not present

## 2012-01-23 DIAGNOSIS — E785 Hyperlipidemia, unspecified: Secondary | ICD-10-CM | POA: Insufficient documentation

## 2012-01-23 DIAGNOSIS — F339 Major depressive disorder, recurrent, unspecified: Secondary | ICD-10-CM | POA: Diagnosis not present

## 2012-01-23 DIAGNOSIS — R0602 Shortness of breath: Secondary | ICD-10-CM | POA: Diagnosis not present

## 2012-01-23 DIAGNOSIS — R002 Palpitations: Secondary | ICD-10-CM | POA: Diagnosis not present

## 2012-01-23 DIAGNOSIS — R42 Dizziness and giddiness: Secondary | ICD-10-CM | POA: Diagnosis not present

## 2012-01-23 LAB — CBC
HCT: 37.3 % (ref 36.0–46.0)
Hemoglobin: 12.8 g/dL (ref 12.0–15.0)
MCH: 30.2 pg (ref 26.0–34.0)
MCHC: 34.3 g/dL (ref 30.0–36.0)
MCV: 88 fL (ref 78.0–100.0)
Platelets: 216 10*3/uL (ref 150–400)
RBC: 4.24 MIL/uL (ref 3.87–5.11)
RDW: 13.6 % (ref 11.5–15.5)
WBC: 7.8 10*3/uL (ref 4.0–10.5)

## 2012-01-23 LAB — BASIC METABOLIC PANEL
BUN: 10 mg/dL (ref 6–23)
CO2: 24 mEq/L (ref 19–32)
Calcium: 9 mg/dL (ref 8.4–10.5)
Chloride: 107 mEq/L (ref 96–112)
Creatinine, Ser: 0.6 mg/dL (ref 0.50–1.10)
GFR calc Af Amer: >90 mL/min (ref >90)
GFR calc non Af Amer: >90 mL/min (ref >90)
Glucose, Bld: 114 mg/dL — ABNORMAL HIGH (ref 70–99)
Potassium: 4.5 mEq/L (ref 3.5–5.1)
Sodium: 139 mEq/L (ref 135–145)

## 2012-01-23 LAB — TROPONIN I: Troponin I: <0.3 ng/mL (ref <0.30)

## 2012-01-23 LAB — GLUCOSE, CAPILLARY: Glucose-Capillary: 123 mg/dL — ABNORMAL HIGH (ref 70–99)

## 2012-01-23 MED ORDER — ONDANSETRON HCL 4 MG/2ML IJ SOLN
4.0000 mg | Freq: Once | INTRAMUSCULAR | Status: DC
  Filled 2012-01-23: qty 2

## 2012-01-23 MED ORDER — SODIUM CHLORIDE 0.9 % IV BOLUS (SEPSIS)
1000.0000 mL | Freq: Once | INTRAVENOUS | Status: AC
  Administered 2012-01-23: 1000 mL via INTRAVENOUS

## 2012-01-23 MED ORDER — ONDANSETRON HCL 4 MG/2ML IJ SOLN
4.0000 mg | Freq: Once | INTRAMUSCULAR | Status: AC
Start: 2012-01-23 — End: 2012-01-23
  Administered 2012-01-23: 4 mg via INTRAVENOUS
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
## 2012-01-23 NOTE — ED Notes (Signed)
Pt seen by md today,  told to come to er for cardiac work up,  because of increased hr and nausea and vomiting. Went to [place] [facility] today, they did not do cardiac workup. Now present with diarhhea, pt admits to eating about 10-15 pieces of sugar free candy and feel like that is cause of diarrhea.

## 2012-01-23 NOTE — Discharge Instructions (Signed)
Diarrhea Infections caused by germs (bacterial) or a virus commonly cause diarrhea. Your caregiver has determined that with time, rest and fluids, the diarrhea should improve. In general, eat normally while drinking more water than usual. Although water may prevent dehydration, it does not contain salt and minerals (electrolytes). Broths, weak tea without caffeine and oral rehydration solutions (ORS) replace fluids and electrolytes. Small amounts of fluids should be taken frequently. Large amounts at one time may not be tolerated. Plain water may be harmful in infants and the elderly. Oral rehydrating solutions (ORS) are available at pharmacies and grocery stores. ORS replace water and important electrolytes in proper proportions. Sports drinks are not as effective as ORS and may be harmful due to sugars worsening diarrhea.  ORS is especially recommended for use in children with diarrhea. As a general guideline for children, replace any new fluid losses from diarrhea and/or vomiting with ORS as follows:   If your child weighs 22 pounds or under (10 kg or less), give 60-120 mL ( -  cup or 2 - 4 ounces) of ORS for each episode of diarrheal stool or vomiting episode.   If your child weighs more than 22 pounds (more than 10 kgs), give 120-240 mL ( - 1 cup or 4 - 8 ounces) of ORS for each diarrheal stool or episode of vomiting.   While correcting for dehydration, children should eat normally. However, foods high in sugar should be avoided because this may worsen diarrhea. Large amounts of carbonated soft drinks, juice, gelatin desserts and other highly sugared drinks should be avoided.   After correction of dehydration, other liquids that are appealing to the child may be added. Children should drink small amounts of fluids frequently and fluids should be increased as tolerated. Children should drink enough fluids to keep urine clear or pale yellow.   Adults should eat normally while drinking more fluids  than usual. Drink small amounts of fluids frequently and increase as tolerated. Drink enough fluids to keep urine clear or pale yellow. Broths, weak decaffeinated tea, lemon lime soft drinks (allowed to go flat) and ORS replace fluids and electrolytes.   Avoid:   Carbonated drinks.   Juice.   Extremely hot or cold fluids.   Caffeine drinks.   Fatty, greasy foods.   Alcohol.   Tobacco.   Too much intake of anything at one time.   Gelatin desserts.   Probiotics are active cultures of beneficial bacteria. They may lessen the amount and number of diarrheal stools in adults. Probiotics can be found in yogurt with active cultures and in supplements.   Wash hands well to avoid spreading bacteria and virus.   Anti-diarrheal medications are not recommended for infants and children.   Only take over-the-counter or prescription medicines for pain, discomfort or fever as directed by your caregiver. Do not give aspirin to children because it may cause Reye's Syndrome.   For adults, ask your caregiver if you should continue all prescribed and over-the-counter medicines.   If your caregiver has given you a follow-up appointment, it is very important to keep that appointment. Not keeping the appointment could result in a chronic or permanent injury, and disability. If there is any problem keeping the appointment, you must call back to this facility for assistance.  SEEK IMMEDIATE MEDICAL CARE IF:   You or your child is unable to keep fluids down or other symptoms or problems become worse in spite of treatment.   Vomiting or diarrhea develops and becomes persistent.     There is vomiting of blood or bile (green material).   There is blood in the stool or the stools are black and tarry.   There is no urine output in 6-8 hours or there is only a small amount of very dark urine.   Abdominal pain develops, increases or localizes.   You have a fever.   Your baby is older than 3 months with a  rectal temperature of 102 F (38.9 C) or higher.   Your baby is 32 months old or younger with a rectal temperature of 100.4 F (38 C) or higher.   You or your child develops excessive weakness, dizziness, fainting or extreme thirst.   You or your child develops a rash, stiff neck, severe headache or become irritable or sleepy and difficult to awaken.  MAKE SURE YOU:   Understand these instructions.   Will watch your condition.   Will get help right away if you are not doing well or get worse.  Document Released: 07/21/2002 Document Revised: 07/20/2011 Document Reviewed: 06/07/2009 [facility] Patient Information 2012 [place], [place].Tachycardia, Nonspecific In adults, the heart normally beats between 60 and 100 times a minute. A heart rate over 100 is called tachycardia. When your heart beats too fast, it may not be able to pump enough blood to the rest of the body. CAUSES   Exercise or exertion.   Fever.   Pain or injury.   Infection.   Loss of fluid (dehydration).   Overactive thyroid.   Lack of red blood cells (anemia).   Anxiety.   Alcohol.   Heart arrhythmia.   Caffeine.   Tobacco products.   Diet pills.   Street drugs.   Heart disease.  SYMPTOMS  Palpitations (rapid or irregular heartbeat).   Dizziness.   Tiredness (fatigue).   Shortness of breath.  DIAGNOSIS  After an exam and taking a history, your caregiver may order:  Blood tests.   Electrocardiogram (EKG).   Heart monitor.  TREATMENT  Treatment will depend on the cause and potential for harm. It may include:  Intravenous (IV) replacement of fluids or blood.   Antidote or reversal medicines.   Changes in your present medicines.   Lifestyle changes.  HOME CARE INSTRUCTIONS   Get rest.   Drink enough water and fluids to keep your urine clear or pale yellow.   Avoid:   Caffeine.   Nicotine.   Alcohol.   Stress.   Chocolate.   Stimulants.   Only take medicine as  directed by your caregiver.  SEEK IMMEDIATE MEDICAL CARE IF:   You have pain in your chest, upper arms, jaw, or neck.   You become weak, dizzy, or feel faint.   You have palpitations that will not go away.   You throw up (vomit), have diarrhea, or pass blood.   You look pale and your skin is cool and wet.  MAKE SURE YOU:   Understand these instructions.   Will watch your condition.   Will get help right away if you are not doing well or get worse.  Document Released: 09/07/2004 Document Revised: 07/20/2011 Document Reviewed: 07/11/2011 [facility] Patient Information 2012 [place], [place].

## 2012-01-24 ENCOUNTER — Telehealth (HOSPITAL_BASED_OUTPATIENT_CLINIC_OR_DEPARTMENT_OTHER): Payer: Self-pay | Admitting: *Deleted

## 2012-01-25 ENCOUNTER — Telehealth: Payer: Self-pay | Admitting: Family Medicine

## 2012-01-25 DIAGNOSIS — R002 Palpitations: Secondary | ICD-10-CM

## 2012-01-25 DIAGNOSIS — R079 Chest pain, unspecified: Secondary | ICD-10-CM | POA: Diagnosis not present

## 2012-01-25 DIAGNOSIS — E269 Hyperaldosteronism, unspecified: Secondary | ICD-10-CM | POA: Diagnosis not present

## 2012-01-25 DIAGNOSIS — D509 Iron deficiency anemia, unspecified: Secondary | ICD-10-CM | POA: Diagnosis not present

## 2012-01-25 DIAGNOSIS — Z79899 Other long term (current) drug therapy: Secondary | ICD-10-CM | POA: Diagnosis not present

## 2012-01-25 DIAGNOSIS — J45909 Unspecified asthma, uncomplicated: Secondary | ICD-10-CM | POA: Diagnosis not present

## 2012-01-25 DIAGNOSIS — R0789 Other chest pain: Secondary | ICD-10-CM | POA: Diagnosis not present

## 2012-01-25 NOTE — Telephone Encounter (Signed)
OK to refer. I think she already has a cards though.

## 2012-01-25 NOTE — Telephone Encounter (Signed)
Patient called request to know if she can have a referral to [facility] her heart staying above 100 still having issues. Pt request a call

## 2012-01-26 NOTE — ED Provider Notes (Signed)
Medical screening examination/treatment/procedure(s) were performed by non-physician practitioner and as supervising physician I was immediately available for consultation/collaboration.  Kourtland Coopman, MD 01/26/12 0438 

## 2012-01-26 NOTE — Telephone Encounter (Signed)
Let Jenn know, I already put the referal in.

## 2012-01-26 NOTE — Telephone Encounter (Signed)
Pt notified. Wants to see Dr Karie Chimera

## 2012-01-29 DIAGNOSIS — R002 Palpitations: Secondary | ICD-10-CM | POA: Diagnosis not present

## 2012-01-29 DIAGNOSIS — F339 Major depressive disorder, recurrent, unspecified: Secondary | ICD-10-CM | POA: Diagnosis not present

## 2012-01-29 DIAGNOSIS — R05 Cough: Secondary | ICD-10-CM | POA: Diagnosis not present

## 2012-01-29 DIAGNOSIS — R0789 Other chest pain: Secondary | ICD-10-CM | POA: Diagnosis not present

## 2012-01-29 DIAGNOSIS — R059 Cough, unspecified: Secondary | ICD-10-CM | POA: Diagnosis not present

## 2012-01-29 DIAGNOSIS — I10 Essential (primary) hypertension: Secondary | ICD-10-CM | POA: Diagnosis not present

## 2012-01-29 DIAGNOSIS — R071 Chest pain on breathing: Secondary | ICD-10-CM | POA: Diagnosis not present

## 2012-01-29 DIAGNOSIS — R0602 Shortness of breath: Secondary | ICD-10-CM | POA: Diagnosis not present

## 2012-01-29 DIAGNOSIS — Z79899 Other long term (current) drug therapy: Secondary | ICD-10-CM | POA: Diagnosis not present

## 2012-01-30 DIAGNOSIS — R002 Palpitations: Secondary | ICD-10-CM | POA: Diagnosis not present

## 2012-01-30 DIAGNOSIS — R0602 Shortness of breath: Secondary | ICD-10-CM | POA: Diagnosis not present

## 2012-01-30 DIAGNOSIS — R071 Chest pain on breathing: Secondary | ICD-10-CM | POA: Diagnosis not present

## 2012-01-30 DIAGNOSIS — R109 Unspecified abdominal pain: Secondary | ICD-10-CM | POA: Diagnosis not present

## 2012-01-30 DIAGNOSIS — R1011 Right upper quadrant pain: Secondary | ICD-10-CM | POA: Diagnosis not present

## 2012-01-30 DIAGNOSIS — R1310 Dysphagia, unspecified: Secondary | ICD-10-CM | POA: Diagnosis not present

## 2012-01-30 DIAGNOSIS — R079 Chest pain, unspecified: Secondary | ICD-10-CM | POA: Diagnosis not present

## 2012-01-30 DIAGNOSIS — K859 Acute pancreatitis without necrosis or infection, unspecified: Secondary | ICD-10-CM | POA: Diagnosis not present

## 2012-01-30 DIAGNOSIS — R110 Nausea: Secondary | ICD-10-CM | POA: Diagnosis not present

## 2012-01-30 DIAGNOSIS — G8929 Other chronic pain: Secondary | ICD-10-CM | POA: Diagnosis not present

## 2012-01-30 DIAGNOSIS — R059 Cough, unspecified: Secondary | ICD-10-CM | POA: Diagnosis not present

## 2012-01-30 DIAGNOSIS — K219 Gastro-esophageal reflux disease without esophagitis: Secondary | ICD-10-CM | POA: Diagnosis not present

## 2012-01-30 DIAGNOSIS — R05 Cough: Secondary | ICD-10-CM | POA: Diagnosis not present

## 2012-01-31 DIAGNOSIS — R110 Nausea: Secondary | ICD-10-CM | POA: Diagnosis not present

## 2012-01-31 DIAGNOSIS — R109 Unspecified abdominal pain: Secondary | ICD-10-CM | POA: Diagnosis not present

## 2012-01-31 DIAGNOSIS — J45909 Unspecified asthma, uncomplicated: Secondary | ICD-10-CM | POA: Diagnosis not present

## 2012-02-01 DIAGNOSIS — R1013 Epigastric pain: Secondary | ICD-10-CM | POA: Diagnosis not present

## 2012-02-01 DIAGNOSIS — R110 Nausea: Secondary | ICD-10-CM | POA: Diagnosis not present

## 2012-02-01 DIAGNOSIS — R198 Other specified symptoms and signs involving the digestive system and abdomen: Secondary | ICD-10-CM | POA: Diagnosis not present

## 2012-02-02 DIAGNOSIS — I10 Essential (primary) hypertension: Secondary | ICD-10-CM | POA: Diagnosis not present

## 2012-02-05 DIAGNOSIS — R1013 Epigastric pain: Secondary | ICD-10-CM | POA: Diagnosis not present

## 2012-02-05 DIAGNOSIS — K2270 Barrett's esophagus without dysplasia: Secondary | ICD-10-CM | POA: Diagnosis not present

## 2012-02-05 DIAGNOSIS — F411 Generalized anxiety disorder: Secondary | ICD-10-CM | POA: Diagnosis not present

## 2012-02-05 DIAGNOSIS — D509 Iron deficiency anemia, unspecified: Secondary | ICD-10-CM | POA: Diagnosis not present

## 2012-02-05 DIAGNOSIS — K219 Gastro-esophageal reflux disease without esophagitis: Secondary | ICD-10-CM | POA: Diagnosis not present

## 2012-02-05 DIAGNOSIS — Z87891 Personal history of nicotine dependence: Secondary | ICD-10-CM | POA: Diagnosis not present

## 2012-02-05 DIAGNOSIS — N8501 Benign endometrial hyperplasia: Secondary | ICD-10-CM | POA: Diagnosis not present

## 2012-02-05 DIAGNOSIS — Z79899 Other long term (current) drug therapy: Secondary | ICD-10-CM | POA: Diagnosis not present

## 2012-02-05 DIAGNOSIS — R110 Nausea: Secondary | ICD-10-CM | POA: Diagnosis not present

## 2012-02-05 DIAGNOSIS — R630 Anorexia: Secondary | ICD-10-CM | POA: Diagnosis not present

## 2012-02-05 DIAGNOSIS — I10 Essential (primary) hypertension: Secondary | ICD-10-CM | POA: Diagnosis not present

## 2012-02-06 DIAGNOSIS — D649 Anemia, unspecified: Secondary | ICD-10-CM | POA: Diagnosis not present

## 2012-02-06 DIAGNOSIS — R079 Chest pain, unspecified: Secondary | ICD-10-CM | POA: Diagnosis not present

## 2012-02-06 DIAGNOSIS — I10 Essential (primary) hypertension: Secondary | ICD-10-CM | POA: Diagnosis not present

## 2012-02-06 DIAGNOSIS — I498 Other specified cardiac arrhythmias: Secondary | ICD-10-CM | POA: Diagnosis not present

## 2012-02-06 DIAGNOSIS — F339 Major depressive disorder, recurrent, unspecified: Secondary | ICD-10-CM | POA: Diagnosis not present

## 2012-02-08 ENCOUNTER — Encounter (HOSPITAL_BASED_OUTPATIENT_CLINIC_OR_DEPARTMENT_OTHER): Payer: Self-pay | Admitting: Emergency Medicine

## 2012-02-08 ENCOUNTER — Emergency Department (HOSPITAL_BASED_OUTPATIENT_CLINIC_OR_DEPARTMENT_OTHER)
Admission: EM | Admit: 2012-02-08 | Discharge: 2012-02-08 | Disposition: A | Discharge status: Home / Self Care | Payer: Medicare Other | Attending: Emergency Medicine | Admitting: Emergency Medicine

## 2012-02-08 DIAGNOSIS — N760 Acute vaginitis: Secondary | ICD-10-CM | POA: Insufficient documentation

## 2012-02-08 DIAGNOSIS — B9689 Other specified bacterial agents as the cause of diseases classified elsewhere: Secondary | ICD-10-CM

## 2012-02-08 DIAGNOSIS — J45909 Unspecified asthma, uncomplicated: Secondary | ICD-10-CM | POA: Insufficient documentation

## 2012-02-08 DIAGNOSIS — I10 Essential (primary) hypertension: Secondary | ICD-10-CM | POA: Insufficient documentation

## 2012-02-08 DIAGNOSIS — G4730 Sleep apnea, unspecified: Secondary | ICD-10-CM | POA: Insufficient documentation

## 2012-02-08 DIAGNOSIS — Z87891 Personal history of nicotine dependence: Secondary | ICD-10-CM | POA: Insufficient documentation

## 2012-02-08 DIAGNOSIS — K219 Gastro-esophageal reflux disease without esophagitis: Secondary | ICD-10-CM | POA: Insufficient documentation

## 2012-02-08 DIAGNOSIS — A499 Bacterial infection, unspecified: Secondary | ICD-10-CM | POA: Insufficient documentation

## 2012-02-08 DIAGNOSIS — E785 Hyperlipidemia, unspecified: Secondary | ICD-10-CM | POA: Insufficient documentation

## 2012-02-08 DIAGNOSIS — Z9089 Acquired absence of other organs: Secondary | ICD-10-CM | POA: Diagnosis not present

## 2012-02-08 DIAGNOSIS — R109 Unspecified abdominal pain: Secondary | ICD-10-CM

## 2012-02-08 LAB — URINE MICROSCOPIC-ADD ON

## 2012-02-08 LAB — CBC
HCT: 38.6 % (ref 36.0–46.0)
Hemoglobin: 13 g/dL (ref 12.0–15.0)
MCH: 29.3 pg (ref 26.0–34.0)
MCHC: 33.7 g/dL (ref 30.0–36.0)
MCV: 86.9 fL (ref 78.0–100.0)
Platelets: 213 10*3/uL (ref 150–400)
RBC: 4.44 MIL/uL (ref 3.87–5.11)
RDW: 13.9 % (ref 11.5–15.5)
WBC: 5.3 10*3/uL (ref 4.0–10.5)

## 2012-02-08 LAB — COMPREHENSIVE METABOLIC PANEL
ALT: 15 U/L (ref 0–35)
AST: 12 U/L (ref 0–37)
Albumin: 3.8 g/dL (ref 3.5–5.2)
Alkaline Phosphatase: 58 U/L (ref 39–117)
BUN: 9 mg/dL (ref 6–23)
CO2: 22 mEq/L (ref 19–32)
Calcium: 9 mg/dL (ref 8.4–10.5)
Chloride: 107 mEq/L (ref 96–112)
Creatinine, Ser: 0.5 mg/dL (ref 0.50–1.10)
GFR calc Af Amer: >90 mL/min (ref >90)
GFR calc non Af Amer: >90 mL/min (ref >90)
Glucose, Bld: 93 mg/dL (ref 70–99)
Potassium: 3.7 mEq/L (ref 3.5–5.1)
Sodium: 139 mEq/L (ref 135–145)
Total Bilirubin: 0.3 mg/dL (ref 0.3–1.2)
Total Protein: 7.1 g/dL (ref 6.0–8.3)

## 2012-02-08 LAB — URINALYSIS, ROUTINE W REFLEX MICROSCOPIC
Bilirubin Urine: NEGATIVE
Glucose, UA: NEGATIVE mg/dL
Ketones, ur: NEGATIVE mg/dL
Leukocytes, UA: NEGATIVE
Nitrite: NEGATIVE
Protein, ur: NEGATIVE mg/dL
Specific Gravity, Urine: 1.021 (ref 1.005–1.030)
Urobilinogen, UA: 0.2 mg/dL (ref 0.0–1.0)
pH: 6 (ref 5.0–8.0)

## 2012-02-08 LAB — WET PREP, GENITAL
Trich, Wet Prep: NONE SEEN
Yeast Wet Prep HPF POC: NONE SEEN

## 2012-02-08 LAB — DIFFERENTIAL
Basophils Absolute: 0 10*3/uL (ref 0.0–0.1)
Basophils Relative: 0 % (ref 0–1)
Eosinophils Absolute: 0.1 10*3/uL (ref 0.0–0.7)
Eosinophils Relative: 3 % (ref 0–5)
Lymphocytes Relative: 23 % (ref 12–46)
Lymphs Abs: 1.2 10*3/uL (ref 0.7–4.0)
Monocytes Absolute: 0.5 10*3/uL (ref 0.1–1.0)
Monocytes Relative: 10 % (ref 3–12)
Neutro Abs: 3.4 10*3/uL (ref 1.7–7.7)
Neutrophils Relative %: 64 % (ref 43–77)

## 2012-02-08 LAB — LIPASE, BLOOD: Lipase: 42 U/L (ref 11–59)

## 2012-02-08 MED ORDER — ONDANSETRON HCL 4 MG/2ML IJ SOLN
4.0000 mg | Freq: Once | INTRAMUSCULAR | Status: AC
  Administered 2012-02-08: 4 mg via INTRAVENOUS
  Filled 2012-02-08: qty 2

## 2012-02-08 MED ORDER — CLINDAMYCIN PHOSPHATE 2 % VA CREA: 1.0000 | TOPICAL_CREAM | Freq: Every day | VAGINAL | Status: AC

## 2012-02-08 MED ORDER — SODIUM CHLORIDE 0.9 % IV BOLUS (SEPSIS)
1000.0000 mL | Freq: Once | INTRAVENOUS | Status: AC
  Administered 2012-02-08: 1000 mL via INTRAVENOUS

## 2012-02-08 NOTE — ED Notes (Signed)
Pt amb to room 8 with quick steady gait in nad. Dr. Alto Denver at bedside for eval.
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
## 2012-02-09 LAB — GC/CHLAMYDIA PROBE AMP, GENITAL
Chlamydia, DNA Probe: NEGATIVE
GC Probe Amp, Genital: NEGATIVE

## 2012-02-11 IMAGING — CR DG NECK SOFT TISSUE
1 series · 1 of 1 positions shown · non-contrast
Comparison: Lateral cervical spine film 09/19/2010.

CLINICAL DATA: Cough and sore throat.

NECK SOFT TISSUES - 1+ VIEW

[view not recorded]
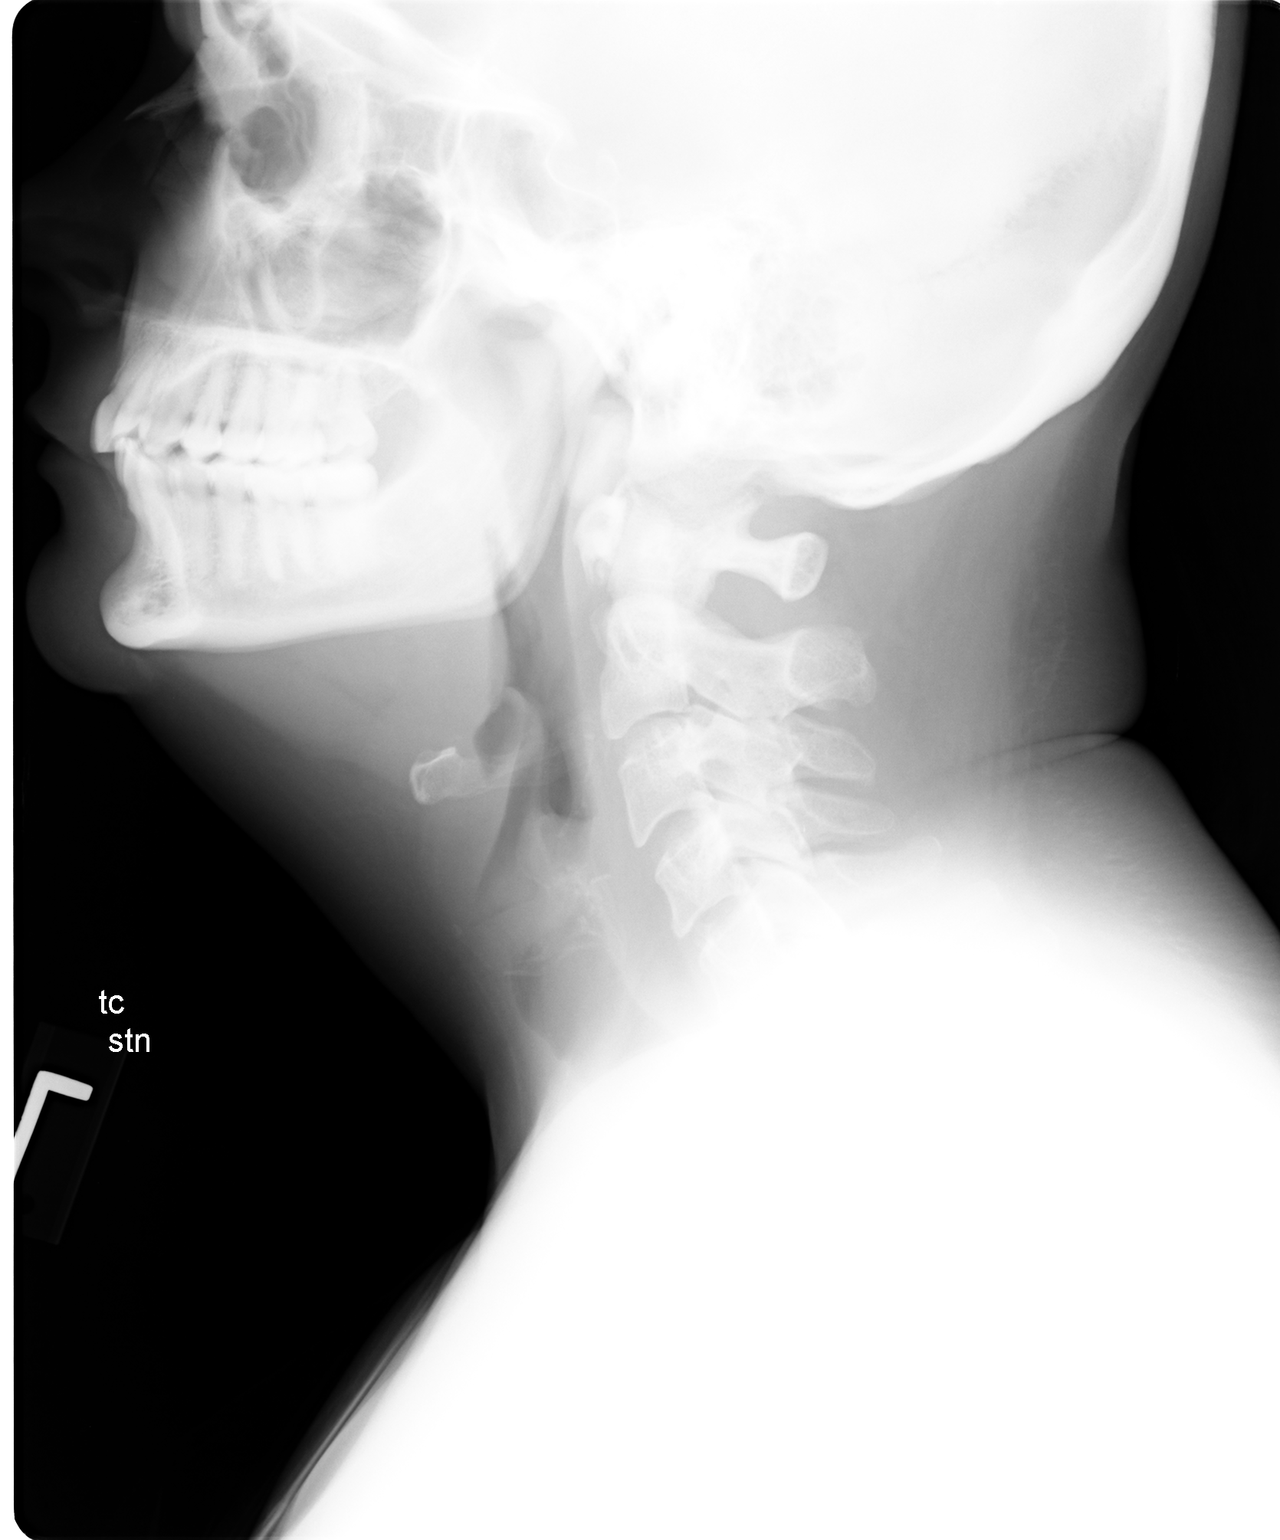

[1 of 1 positions shown; findings below may reference images not displayed]

FINDINGS: The epiglottis is normal.  The aryepiglottic folds are
normal.  No hypopharyngeal distention or subglottic narrowing.  No
abnormal prevertebral soft tissue swelling.  No radiopaque foreign
body.  The cervical vertebral bodies appear normal.
IMPRESSION: Normal soft tissue view of the neck.

## 2012-02-12 ENCOUNTER — Ambulatory Visit (INDEPENDENT_AMBULATORY_CARE_PROVIDER_SITE_OTHER): Payer: Medicare Other | Admitting: Family Medicine

## 2012-02-12 ENCOUNTER — Ambulatory Visit: Payer: Medicare Other | Admitting: Family Medicine

## 2012-02-12 ENCOUNTER — Encounter: Payer: Self-pay | Admitting: Family Medicine

## 2012-02-12 VITALS — BP 154/101 | HR 97 | Ht 62.0 in | Wt 180.0 lb

## 2012-02-12 DIAGNOSIS — I10 Essential (primary) hypertension: Secondary | ICD-10-CM

## 2012-02-12 DIAGNOSIS — R1310 Dysphagia, unspecified: Secondary | ICD-10-CM
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
## 2012-02-13 ENCOUNTER — Telehealth: Payer: Self-pay | Admitting: Family Medicine

## 2012-02-13 ENCOUNTER — Ambulatory Visit: Payer: Medicare Other | Admitting: Family Medicine

## 2012-02-13 DIAGNOSIS — M79609 Pain in unspecified limb: Secondary | ICD-10-CM | POA: Diagnosis not present

## 2012-02-13 DIAGNOSIS — I10 Essential (primary) hypertension: Secondary | ICD-10-CM | POA: Diagnosis not present

## 2012-02-13 DIAGNOSIS — R1310 Dysphagia, unspecified: Secondary | ICD-10-CM | POA: Diagnosis not present

## 2012-02-13 DIAGNOSIS — Z881 Allergy status to other antibiotic agents status: Secondary | ICD-10-CM | POA: Diagnosis not present

## 2012-02-13 DIAGNOSIS — IMO0002 Reserved for concepts with insufficient information to code with codable children: Secondary | ICD-10-CM | POA: Diagnosis not present

## 2012-02-13 DIAGNOSIS — Z888 Allergy status to other drugs, medicaments and biological substances status: Secondary | ICD-10-CM | POA: Diagnosis not present

## 2012-02-13 DIAGNOSIS — Z79899 Other long term (current) drug therapy: Secondary | ICD-10-CM | POA: Diagnosis not present

## 2012-02-13 NOTE — Telephone Encounter (Signed)
Call pt: Normal exam on the upper GI.   No sign of reflux.  Recommend f/u with her ENT if still having dysphagia.

## 2012-02-14 DIAGNOSIS — F339 Major depressive disorder, recurrent, unspecified: Secondary | ICD-10-CM | POA: Diagnosis not present

## 2012-02-14 NOTE — Telephone Encounter (Signed)
Left message on vm

## 2012-02-17 DIAGNOSIS — R079 Chest pain, unspecified: Secondary | ICD-10-CM | POA: Diagnosis not present

## 2012-02-17 DIAGNOSIS — I10 Essential (primary) hypertension: Secondary | ICD-10-CM | POA: Diagnosis not present

## 2012-02-17 DIAGNOSIS — R1310 Dysphagia, unspecified: Secondary | ICD-10-CM | POA: Diagnosis not present

## 2012-02-17 DIAGNOSIS — IMO0002 Reserved for concepts with insufficient information to code with codable children: Secondary | ICD-10-CM | POA: Diagnosis not present

## 2012-02-17 DIAGNOSIS — F911 Conduct disorder, childhood-onset type: Secondary | ICD-10-CM | POA: Diagnosis not present

## 2012-02-17 DIAGNOSIS — R112 Nausea with vomiting, unspecified: Secondary | ICD-10-CM | POA: Diagnosis not present

## 2012-02-17 DIAGNOSIS — Z79899 Other long term (current) drug therapy: Secondary | ICD-10-CM | POA: Diagnosis not present

## 2012-02-19 ENCOUNTER — Emergency Department (HOSPITAL_BASED_OUTPATIENT_CLINIC_OR_DEPARTMENT_OTHER): Payer: Medicare Other

## 2012-02-19 ENCOUNTER — Encounter (HOSPITAL_BASED_OUTPATIENT_CLINIC_OR_DEPARTMENT_OTHER): Payer: Self-pay | Admitting: Family Medicine

## 2012-02-19 ENCOUNTER — Emergency Department (HOSPITAL_BASED_OUTPATIENT_CLINIC_OR_DEPARTMENT_OTHER)
Admission: EM | Admit: 2012-02-19 | Discharge: 2012-02-19 | Disposition: A | Discharge status: Home / Self Care | Payer: Medicare Other | Attending: Emergency Medicine | Admitting: Emergency Medicine

## 2012-02-19 DIAGNOSIS — K219 Gastro-esophageal reflux disease without esophagitis: Secondary | ICD-10-CM | POA: Insufficient documentation

## 2012-02-19 DIAGNOSIS — Z87891 Personal history of nicotine dependence: Secondary | ICD-10-CM | POA: Diagnosis not present

## 2012-02-19 DIAGNOSIS — Z79899 Other long term (current) drug therapy: Secondary | ICD-10-CM | POA: Diagnosis not present

## 2012-02-19 DIAGNOSIS — I10 Essential (primary) hypertension: Secondary | ICD-10-CM | POA: Diagnosis not present

## 2012-02-19 DIAGNOSIS — Z9089 Acquired absence of other organs: Secondary | ICD-10-CM | POA: Diagnosis not present

## 2012-02-19 DIAGNOSIS — G4730 Sleep apnea, unspecified: Secondary | ICD-10-CM | POA: Diagnosis not present

## 2012-02-19 DIAGNOSIS — R51 Headache: Secondary | ICD-10-CM | POA: Insufficient documentation

## 2012-02-19 DIAGNOSIS — E785 Hyperlipidemia, unspecified: Secondary | ICD-10-CM | POA: Diagnosis not present

## 2012-02-19 MED ORDER — METHOCARBAMOL 500 MG PO TABS: 500.0000 mg | ORAL_TABLET | Freq: Two times a day (BID) | ORAL | Status: DC

## 2012-02-19 MED ORDER — DICLOFENAC SODIUM 75 MG PO TBEC: 75.0000 mg | DELAYED_RELEASE_TABLET | Freq: Two times a day (BID) | ORAL | Status: DC

## 2012-02-19 MED ORDER — TRAMADOL HCL 50 MG PO TABS
50.0000 mg | ORAL_TABLET | Freq: Once | ORAL | Status: DC
  Filled 2012-02-19: qty 1

## 2012-02-19 MED ORDER — DEXAMETHASONE SODIUM PHOSPHATE 10 MG/ML IJ SOLN
10.0000 mg | Freq: Once | INTRAMUSCULAR | Status: DC
  Filled 2012-02-19: qty 1

## 2012-02-19 NOTE — ED Notes (Signed)
Pt. Said she didn't want either med.  Pt. Crying at time of discharge/ offered mobile crisis to Pt. And behavioral health to the pt.   Pt. Reports she want to go home at this time and she reports she is not suicidal or homicidal.  Pt. Aware that this is the ED and we will be here for Her anytime for any problem she has.

## 2012-02-19 NOTE — ED Notes (Signed)
Pt. Did drive herself here.

## 2012-02-19 NOTE — ED Notes (Signed)
Pt. Took her own tylenol and her own b/p med just now while in room .

## 2012-02-19 NOTE — ED Notes (Signed)
Pt. Walked stedy gait to room from waiting room.  Pt. Fussing and complaing about the wait to her room.  No distress noted in pt.

## 2012-02-19 NOTE — ED Notes (Addendum)
Pt c/o headache to right temple and right hand "going numb" since last night. Pt alert and oriented and talking fast. Face is symmetrical, pt ambulating with steady gait.

## 2012-02-19 NOTE — ED Provider Notes (Signed)
Medical screening examination/treatment/procedure(s) were performed by non-physician practitioner and as supervising physician I was immediately available for consultation/collaboration.   Rolan Bucco, MD 02/19/12 2329
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
## 2012-02-19 NOTE — ED Notes (Signed)
Pt. Reports she is having things in her head that is not feeling right.  Pt. Reports she has tried to call her neurologist and he did not return her call.    Then she said he did call her after she is in room 7.  Pt. Does not want to go to his office now.  Pt. Said she has had episodes of disorientation.  Pt. Is alert and oriented at present time.

## 2012-02-19 NOTE — ED Notes (Signed)
Pt. Is crying and yelling and then will be calm.  Pt. Yelling on her phone and crying.  Pt. Calms down and becomes teaful with fast talking and no neuro deficits noted.

## 2012-02-22 DIAGNOSIS — R079 Chest pain, unspecified: Secondary | ICD-10-CM | POA: Diagnosis not present

## 2012-02-23 DIAGNOSIS — R569 Unspecified convulsions: Secondary | ICD-10-CM | POA: Diagnosis not present

## 2012-02-23 DIAGNOSIS — G4730 Sleep apnea, unspecified: Secondary | ICD-10-CM | POA: Diagnosis not present

## 2012-02-23 DIAGNOSIS — K2270 Barrett's esophagus without dysplasia: Secondary | ICD-10-CM | POA: Diagnosis not present

## 2012-02-23 DIAGNOSIS — M79609 Pain in unspecified limb: Secondary | ICD-10-CM | POA: Diagnosis not present

## 2012-02-23 DIAGNOSIS — J40 Bronchitis, not specified as acute or chronic: Secondary | ICD-10-CM | POA: Diagnosis not present

## 2012-02-23 DIAGNOSIS — G8929 Other chronic pain: Secondary | ICD-10-CM | POA: Diagnosis not present

## 2012-02-23 DIAGNOSIS — D649 Anemia, unspecified: Secondary | ICD-10-CM | POA: Diagnosis not present

## 2012-02-23 DIAGNOSIS — R000 Tachycardia, unspecified: Secondary | ICD-10-CM | POA: Diagnosis not present

## 2012-02-23 DIAGNOSIS — R071 Chest pain on breathing: Secondary | ICD-10-CM | POA: Diagnosis not present

## 2012-02-23 DIAGNOSIS — M889 Osteitis deformans of unspecified bone: Secondary | ICD-10-CM | POA: Diagnosis not present

## 2012-02-23 DIAGNOSIS — J45909 Unspecified asthma, uncomplicated: Secondary | ICD-10-CM | POA: Diagnosis not present

## 2012-02-23 DIAGNOSIS — F411 Generalized anxiety disorder: Secondary | ICD-10-CM | POA: Diagnosis not present

## 2012-02-23 DIAGNOSIS — R0789 Other chest pain: Secondary | ICD-10-CM | POA: Diagnosis not present

## 2012-02-23 DIAGNOSIS — R079 Chest pain, unspecified: Secondary | ICD-10-CM | POA: Diagnosis not present

## 2012-02-23 DIAGNOSIS — Z87442 Personal history of urinary calculi: Secondary | ICD-10-CM | POA: Diagnosis not present

## 2012-02-23 DIAGNOSIS — F459 Somatoform disorder, unspecified: Secondary | ICD-10-CM | POA: Diagnosis not present

## 2012-02-23 DIAGNOSIS — E269 Hyperaldosteronism, unspecified: Secondary | ICD-10-CM | POA: Diagnosis not present

## 2012-02-23 DIAGNOSIS — K219 Gastro-esophageal reflux disease without esophagitis: Secondary | ICD-10-CM | POA: Diagnosis not present

## 2012-02-25 IMAGING — CR DG CHEST 2V
2 series · 2 of 2 positions shown · non-contrast
Comparison: Chest radiograph performed 09/22/2010

CLINICAL DATA: Left shoulder pain, radiating into neck and worse
with exertion.

CHEST - 2 VIEW

[w chest pa]
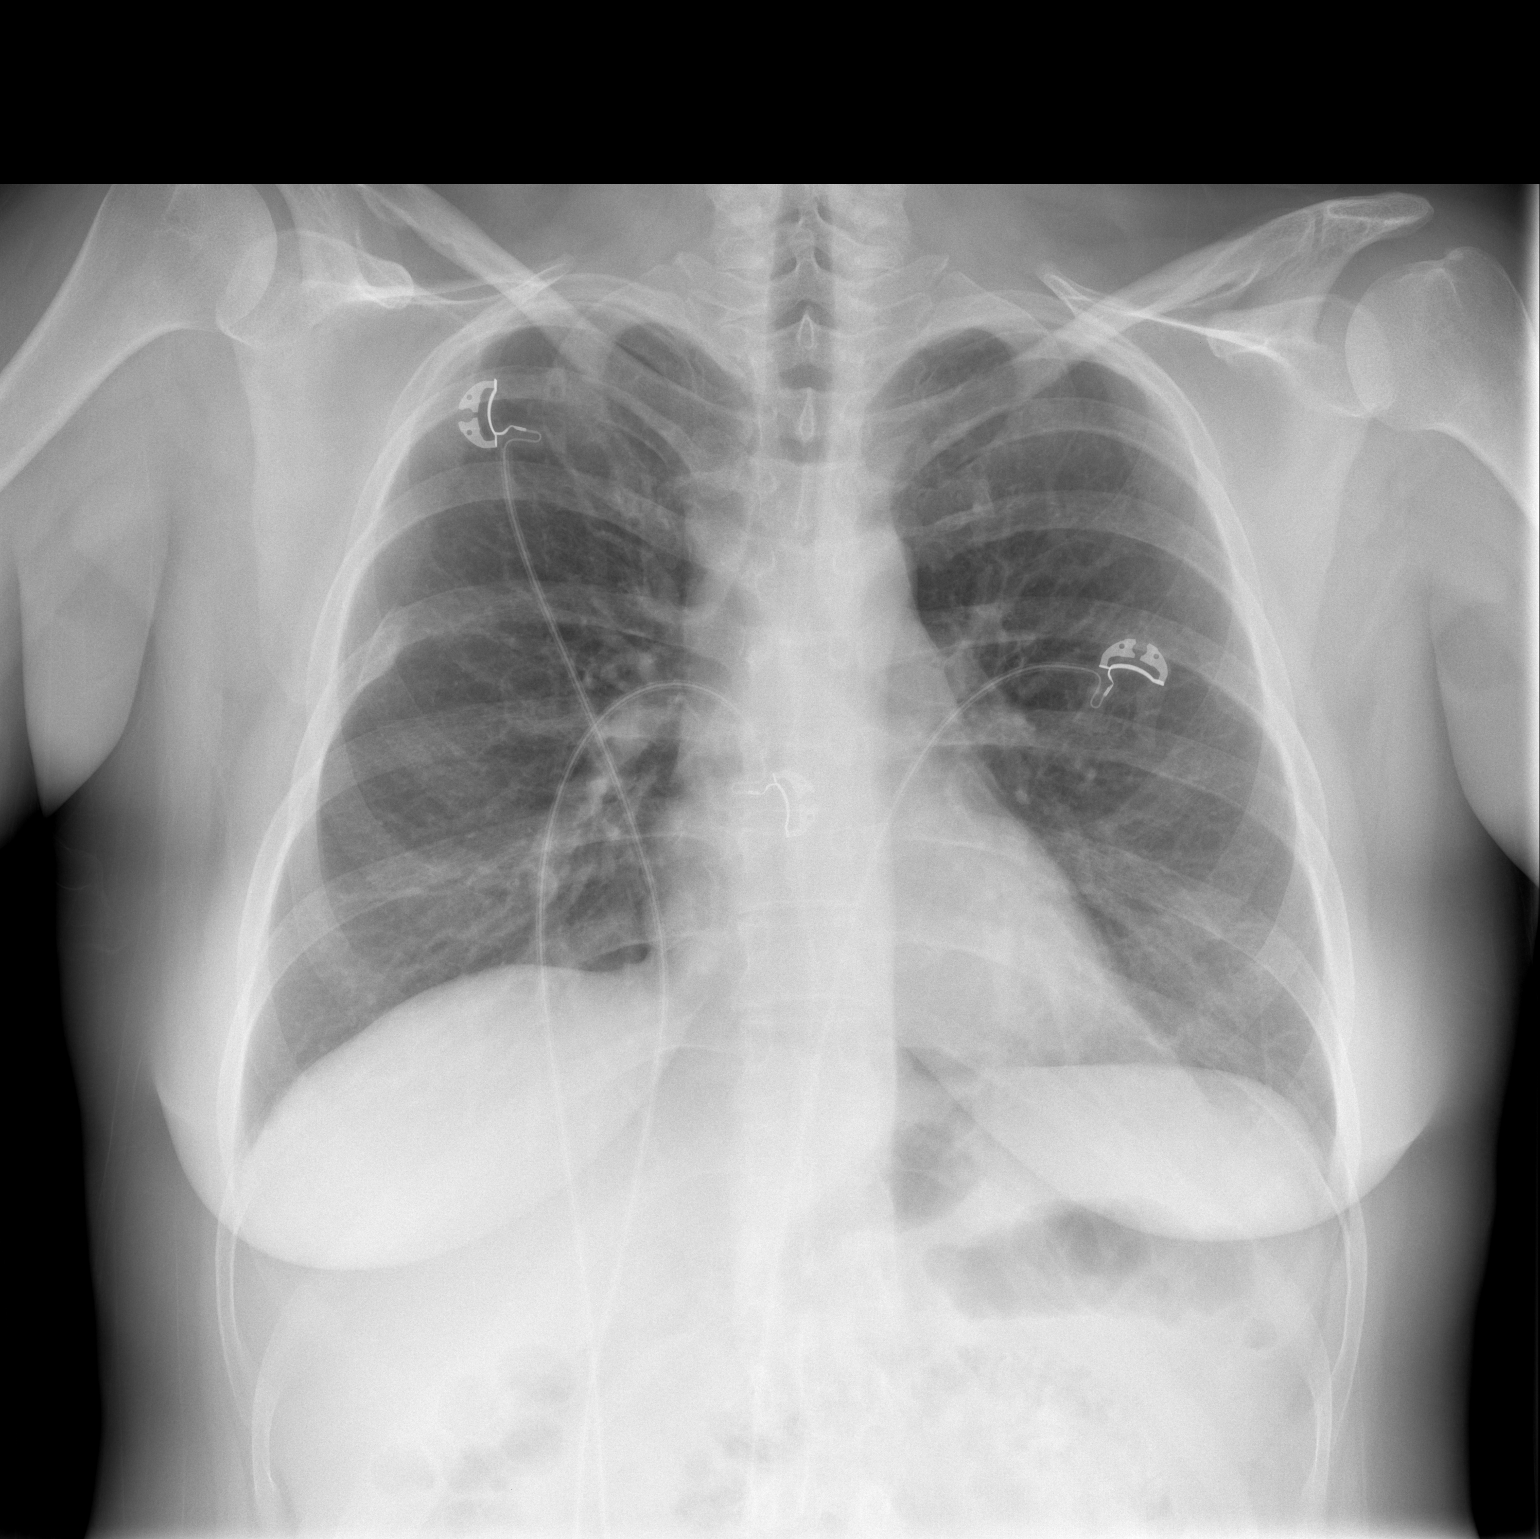

[w chest lat]
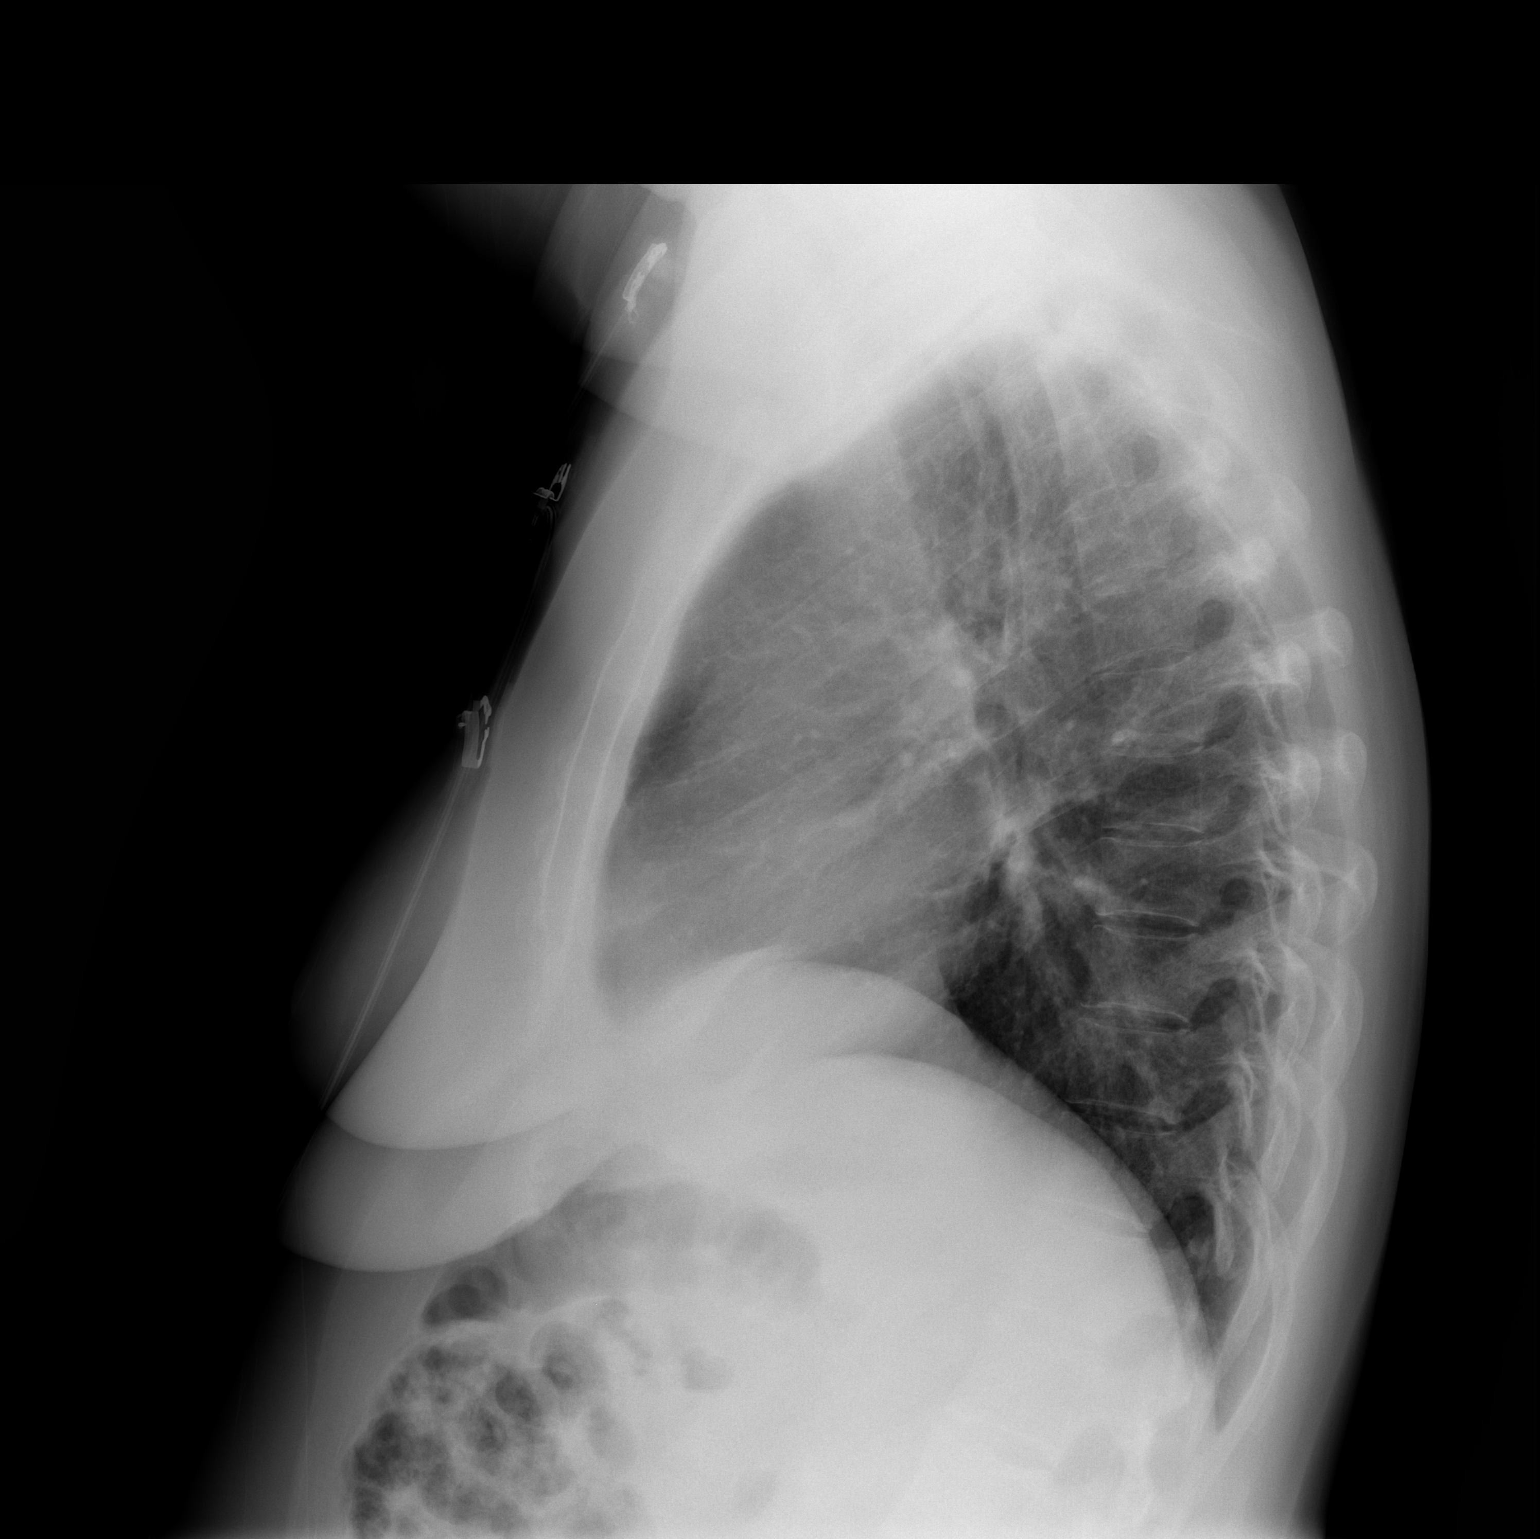

[2 of 2 positions shown; findings below may reference images not displayed]

FINDINGS: The lungs are well-aerated and clear.  There is no
evidence of focal opacification, pleural effusion or pneumothorax.

The heart is normal in size; the mediastinal contour is within
normal limits.  No acute osseous abnormalities are seen. There is a
stable chronic right-sided rib fracture.  The left shoulder appears
grossly unremarkable.
IMPRESSION: No acute cardiopulmonary process seen.

## 2012-02-26 ENCOUNTER — Encounter (HOSPITAL_BASED_OUTPATIENT_CLINIC_OR_DEPARTMENT_OTHER): Payer: Self-pay | Admitting: Emergency Medicine

## 2012-02-26 ENCOUNTER — Encounter: Payer: Self-pay | Admitting: Family Medicine

## 2012-02-26 ENCOUNTER — Emergency Department (HOSPITAL_BASED_OUTPATIENT_CLINIC_OR_DEPARTMENT_OTHER)
Admission: EM | Admit: 2012-02-26 | Discharge: 2012-02-26 | Disposition: A | Discharge status: Home / Self Care | Payer: Medicare Other | Attending: Emergency Medicine | Admitting: Emergency Medicine

## 2012-02-26 DIAGNOSIS — E785 Hyperlipidemia, unspecified: Secondary | ICD-10-CM | POA: Diagnosis not present

## 2012-02-26 DIAGNOSIS — G4730 Sleep apnea, unspecified: Secondary | ICD-10-CM | POA: Insufficient documentation

## 2012-02-26 DIAGNOSIS — R682 Dry mouth, unspecified: Secondary | ICD-10-CM

## 2012-02-26 DIAGNOSIS — Z87891 Personal history of nicotine dependence: Secondary | ICD-10-CM | POA: Diagnosis not present

## 2012-02-26 DIAGNOSIS — J45909 Unspecified asthma, uncomplicated: Secondary | ICD-10-CM | POA: Insufficient documentation

## 2012-02-26 DIAGNOSIS — F4310 Post-traumatic stress disorder, unspecified: Secondary | ICD-10-CM | POA: Insufficient documentation

## 2012-02-26 DIAGNOSIS — K117 Disturbances of salivary secretion: Secondary | ICD-10-CM | POA: Diagnosis not present

## 2012-02-26 DIAGNOSIS — I10 Essential (primary) hypertension: Secondary | ICD-10-CM | POA: Insufficient documentation

## 2012-02-26 DIAGNOSIS — K219 Gastro-esophageal reflux disease without esophagitis: Secondary | ICD-10-CM | POA: Diagnosis not present

## 2012-02-26 DIAGNOSIS — F339 Major depressive disorder, recurrent, unspecified: Secondary | ICD-10-CM | POA: Diagnosis not present

## 2012-02-26 LAB — URINALYSIS, ROUTINE W REFLEX MICROSCOPIC
Bilirubin Urine: NEGATIVE
Glucose, UA: NEGATIVE mg/dL
Hgb urine dipstick: NEGATIVE
Ketones, ur: NEGATIVE mg/dL
Leukocytes, UA: NEGATIVE
Nitrite: NEGATIVE
Protein, ur: NEGATIVE mg/dL
Specific Gravity, Urine: 1.026 (ref 1.005–1.030)
Urobilinogen, UA: 0.2 mg/dL (ref 0.0–1.0)
pH: 6 (ref 5.0–8.0)

## 2012-02-26 MED ORDER — SODIUM CHLORIDE 0.9 % IV BOLUS (SEPSIS)
1000.0000 mL | Freq: Once | INTRAVENOUS | Status: AC
  Administered 2012-02-26: 1000 mL via INTRAVENOUS

## 2012-02-26 NOTE — ED Provider Notes (Signed)
Medical screening examination/treatment/procedure(s) were performed by non-physician practitioner and as supervising physician I was immediately available for consultation/collaboration.   Gavin Pound. Oletta Lamas, MD 02/26/12 0454

## 2012-02-26 NOTE — ED Notes (Signed)
Scheduled for endoscopy this week but states she can't eat or drink normally

## 2012-02-26 NOTE — ED Notes (Signed)
Pt thinks she is dehydrated. Pt c/o generalized weakness, dry mouth and dry eyes.
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
## 2012-02-29 DIAGNOSIS — R141 Gas pain: Secondary | ICD-10-CM | POA: Diagnosis not present

## 2012-02-29 DIAGNOSIS — IMO0001 Reserved for inherently not codable concepts without codable children: Secondary | ICD-10-CM | POA: Diagnosis not present

## 2012-02-29 DIAGNOSIS — K219 Gastro-esophageal reflux disease without esophagitis: Secondary | ICD-10-CM | POA: Diagnosis not present

## 2012-02-29 DIAGNOSIS — Z9889 Other specified postprocedural states: Secondary | ICD-10-CM | POA: Diagnosis not present

## 2012-02-29 DIAGNOSIS — G4730 Sleep apnea, unspecified: Secondary | ICD-10-CM | POA: Diagnosis not present

## 2012-02-29 DIAGNOSIS — G43909 Migraine, unspecified, not intractable, without status migrainosus: Secondary | ICD-10-CM | POA: Diagnosis not present

## 2012-02-29 DIAGNOSIS — I10 Essential (primary) hypertension: Secondary | ICD-10-CM | POA: Diagnosis not present

## 2012-02-29 DIAGNOSIS — D649 Anemia, unspecified: Secondary | ICD-10-CM | POA: Diagnosis not present

## 2012-02-29 DIAGNOSIS — R1310 Dysphagia, unspecified: Secondary | ICD-10-CM | POA: Diagnosis not present

## 2012-02-29 DIAGNOSIS — Z882 Allergy status to sulfonamides status: Secondary | ICD-10-CM | POA: Diagnosis not present

## 2012-02-29 DIAGNOSIS — K319 Disease of stomach and duodenum, unspecified: Secondary | ICD-10-CM | POA: Diagnosis not present

## 2012-02-29 DIAGNOSIS — I498 Other specified cardiac arrhythmias: Secondary | ICD-10-CM | POA: Diagnosis not present

## 2012-02-29 DIAGNOSIS — K208 Other esophagitis without bleeding: Secondary | ICD-10-CM | POA: Diagnosis not present

## 2012-02-29 DIAGNOSIS — Z880 Allergy status to penicillin: Secondary | ICD-10-CM | POA: Diagnosis not present

## 2012-02-29 DIAGNOSIS — J45909 Unspecified asthma, uncomplicated: Secondary | ICD-10-CM | POA: Diagnosis not present

## 2012-02-29 DIAGNOSIS — K589 Irritable bowel syndrome without diarrhea: Secondary | ICD-10-CM | POA: Diagnosis not present

## 2012-02-29 DIAGNOSIS — G8929 Other chronic pain: Secondary | ICD-10-CM | POA: Diagnosis not present

## 2012-02-29 DIAGNOSIS — R1013 Epigastric pain: Secondary | ICD-10-CM | POA: Diagnosis not present

## 2012-02-29 DIAGNOSIS — K2100 Gastro-esophageal reflux disease with esophagitis, without bleeding: Secondary | ICD-10-CM | POA: Diagnosis not present

## 2012-02-29 DIAGNOSIS — K2270 Barrett's esophagus without dysplasia: Secondary | ICD-10-CM | POA: Diagnosis not present

## 2012-03-01 DIAGNOSIS — R109 Unspecified abdominal pain: Secondary | ICD-10-CM | POA: Diagnosis not present

## 2012-03-04 ENCOUNTER — Ambulatory Visit: Payer: Medicare Other | Admitting: Family Medicine

## 2012-03-04 ENCOUNTER — Inpatient Hospital Stay (HOSPITAL_COMMUNITY): Payer: Medicare Other

## 2012-03-04 ENCOUNTER — Inpatient Hospital Stay (HOSPITAL_COMMUNITY)
Admission: AD | Admit: 2012-03-04 | Discharge: 2012-03-04 | Disposition: A | Discharge status: Home / Self Care | Payer: Medicare Other | Source: Ambulatory Visit | Attending: Obstetrics & Gynecology | Admitting: Obstetrics & Gynecology

## 2012-03-04 ENCOUNTER — Encounter (HOSPITAL_COMMUNITY): Payer: Self-pay

## 2012-03-04 DIAGNOSIS — F339 Major depressive disorder, recurrent, unspecified: Secondary | ICD-10-CM | POA: Diagnosis not present

## 2012-03-04 DIAGNOSIS — N83209 Unspecified ovarian cyst, unspecified side: Secondary | ICD-10-CM | POA: Diagnosis not present

## 2012-03-04 DIAGNOSIS — D259 Leiomyoma of uterus, unspecified: Secondary | ICD-10-CM | POA: Diagnosis not present

## 2012-03-04 DIAGNOSIS — R102 Pelvic and perineal pain: Secondary | ICD-10-CM

## 2012-03-04 DIAGNOSIS — N949 Unspecified condition associated with female genital organs and menstrual cycle: Secondary | ICD-10-CM | POA: Insufficient documentation

## 2012-03-04 DIAGNOSIS — R300 Dysuria: Secondary | ICD-10-CM | POA: Diagnosis not present

## 2012-03-04 LAB — CBC
HCT: 39.9 % (ref 36.0–46.0)
Hemoglobin: 13 g/dL (ref 12.0–15.0)
MCH: 28.8 pg (ref 26.0–34.0)
MCHC: 32.6 g/dL (ref 30.0–36.0)
MCV: 88.5 fL (ref 78.0–100.0)
Platelets: 212 10*3/uL (ref 150–400)
RBC: 4.51 MIL/uL (ref 3.87–5.11)
RDW: 14.2 % (ref 11.5–15.5)
WBC: 6.2 10*3/uL (ref 4.0–10.5)

## 2012-03-04 LAB — COMPREHENSIVE METABOLIC PANEL
ALT: 20 U/L (ref 0–35)
AST: 13 U/L (ref 0–37)
Albumin: 3.8 g/dL (ref 3.5–5.2)
Alkaline Phosphatase: 62 U/L (ref 39–117)
BUN: 9 mg/dL (ref 6–23)
CO2: 25 mEq/L (ref 19–32)
Calcium: 9.3 mg/dL (ref 8.4–10.5)
Chloride: 105 mEq/L (ref 96–112)
Creatinine, Ser: 0.5 mg/dL (ref 0.50–1.10)
GFR calc Af Amer: >90 mL/min (ref >90)
GFR calc non Af Amer: >90 mL/min (ref >90)
Glucose, Bld: 85 mg/dL (ref 70–99)
Potassium: 3.6 mEq/L (ref 3.5–5.1)
Sodium: 139 mEq/L (ref 135–145)
Total Bilirubin: 0.2 mg/dL — ABNORMAL LOW (ref 0.3–1.2)
Total Protein: 6.9 g/dL (ref 6.0–8.3)

## 2012-03-04 MED ORDER — ONDANSETRON 8 MG PO TBDP: 8.0000 mg | ORAL_TABLET | Freq: Three times a day (TID) | ORAL | Status: AC | PRN

## 2012-03-04 MED ORDER — ONDANSETRON 8 MG PO TBDP
8.0000 mg | ORAL_TABLET | Freq: Once | ORAL | Status: AC
  Administered 2012-03-04: 8 mg via ORAL
  Filled 2012-03-04: qty 1

## 2012-03-04 NOTE — MAU Note (Signed)
Patient was brought in straight from the lobby with c/o rlq sharp pain while urinating and lower back when moving and intermittent spasm. She states that she felt that she was going to faint but not lightheaded. She denies any vaginal bleeding or discharge. She states that the above symptoms started last night. She has multiple health history and was told she have multiple fibroids and recommended hysterectomy but she wanted to have the surgery closer to her home. She is alert and oriented x4.
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
## 2012-03-05 NOTE — MAU Provider Note (Signed)
Attestation of Attending Supervision of Advanced Practitioner (CNM/NP): Evaluation and management procedures were performed by the Advanced Practitioner under my supervision and collaboration.  I have reviewed the Advanced Practitioner's note and chart, and I agree with the management and plan.  HARRAWAY-SMITH, Breta Demedeiros 10:45 AM

## 2012-03-07 ENCOUNTER — Telehealth: Payer: Self-pay | Admitting: Critical Care Medicine

## 2012-03-07 DIAGNOSIS — J029 Acute pharyngitis, unspecified: Secondary | ICD-10-CM | POA: Diagnosis not present

## 2012-03-07 DIAGNOSIS — R059 Cough, unspecified: Secondary | ICD-10-CM | POA: Diagnosis not present

## 2012-03-07 DIAGNOSIS — J040 Acute laryngitis: Secondary | ICD-10-CM | POA: Diagnosis not present

## 2012-03-07 DIAGNOSIS — R0602 Shortness of breath: Secondary | ICD-10-CM | POA: Diagnosis not present

## 2012-03-07 DIAGNOSIS — R05 Cough: Secondary | ICD-10-CM | POA: Diagnosis not present

## 2012-03-07 NOTE — Telephone Encounter (Signed)
LMTCB

## 2012-03-08 ENCOUNTER — Emergency Department (HOSPITAL_BASED_OUTPATIENT_CLINIC_OR_DEPARTMENT_OTHER): Payer: Medicare Other

## 2012-03-08 ENCOUNTER — Emergency Department (HOSPITAL_BASED_OUTPATIENT_CLINIC_OR_DEPARTMENT_OTHER)
Admission: EM | Admit: 2012-03-08 | Discharge: 2012-03-08 | Disposition: A | Discharge status: Home / Self Care | Payer: Medicare Other | Attending: Emergency Medicine | Admitting: Emergency Medicine

## 2012-03-08 ENCOUNTER — Encounter (HOSPITAL_BASED_OUTPATIENT_CLINIC_OR_DEPARTMENT_OTHER): Payer: Self-pay | Admitting: *Deleted

## 2012-03-08 DIAGNOSIS — Z801 Family history of malignant neoplasm of trachea, bronchus and lung: Secondary | ICD-10-CM | POA: Insufficient documentation

## 2012-03-08 DIAGNOSIS — D649 Anemia, unspecified: Secondary | ICD-10-CM | POA: Diagnosis not present

## 2012-03-08 DIAGNOSIS — R059 Cough, unspecified: Secondary | ICD-10-CM | POA: Diagnosis not present

## 2012-03-08 DIAGNOSIS — K219 Gastro-esophageal reflux disease without esophagitis: Secondary | ICD-10-CM | POA: Insufficient documentation

## 2012-03-08 DIAGNOSIS — Z8249 Family history of ischemic heart disease and other diseases of the circulatory system: Secondary | ICD-10-CM | POA: Diagnosis not present

## 2012-03-08 DIAGNOSIS — I498 Other specified cardiac arrhythmias: Secondary | ICD-10-CM | POA: Insufficient documentation

## 2012-03-08 DIAGNOSIS — R0602 Shortness of breath: Secondary | ICD-10-CM | POA: Diagnosis not present

## 2012-03-08 DIAGNOSIS — Z825 Family history of asthma and other chronic lower respiratory diseases: Secondary | ICD-10-CM | POA: Diagnosis not present

## 2012-03-08 DIAGNOSIS — R05 Cough: Secondary | ICD-10-CM | POA: Diagnosis not present

## 2012-03-08 DIAGNOSIS — Z818 Family history of other mental and behavioral disorders: Secondary | ICD-10-CM | POA: Insufficient documentation

## 2012-03-08 DIAGNOSIS — G4730 Sleep apnea, unspecified: Secondary | ICD-10-CM | POA: Diagnosis not present

## 2012-03-08 DIAGNOSIS — J45909 Unspecified asthma, uncomplicated: Secondary | ICD-10-CM | POA: Diagnosis not present

## 2012-03-08 DIAGNOSIS — Z8261 Family history of arthritis: Secondary | ICD-10-CM | POA: Diagnosis not present

## 2012-03-08 DIAGNOSIS — Z9089 Acquired absence of other organs: Secondary | ICD-10-CM | POA: Insufficient documentation

## 2012-03-08 DIAGNOSIS — M19019 Primary osteoarthritis, unspecified shoulder: Secondary | ICD-10-CM | POA: Insufficient documentation

## 2012-03-08 DIAGNOSIS — I10 Essential (primary) hypertension: Secondary | ICD-10-CM | POA: Diagnosis not present

## 2012-03-08 DIAGNOSIS — M545 Low back pain, unspecified: Secondary | ICD-10-CM | POA: Diagnosis not present

## 2012-03-08 DIAGNOSIS — B353 Tinea pedis: Secondary | ICD-10-CM | POA: Diagnosis not present

## 2012-03-08 MED ORDER — LEVALBUTEROL HCL 0.63 MG/3ML IN NEBU
0.6300 mg | INHALATION_SOLUTION | Freq: Once | RESPIRATORY_TRACT | Status: AC
  Administered 2012-03-08: 0.63 mg via RESPIRATORY_TRACT
  Filled 2012-03-08: qty 3

## 2012-03-08 NOTE — ED Provider Notes (Signed)
Medical screening examination/treatment/procedure(s) were performed by non-physician practitioner and as supervising physician I was immediately available for consultation/collaboration.   Celene Kras, MD 03/08/12 2155

## 2012-03-08 NOTE — Telephone Encounter (Signed)
[email or web address]. Tasheka Houseman, CMA  

## 2012-03-08 NOTE — ED Notes (Signed)
PA at bedside.

## 2012-03-08 NOTE — ED Notes (Signed)
Pt c/o shortness of breath with phlegm for few days. Pt also has pain in her back with deep breathing.
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
## 2012-03-08 NOTE — ED Notes (Signed)
Pt complaining of sore throat and problems since she had her endoscopy 8 days ago.  Reports some pain in her back.  Sts that he heart has been racing today - "100 and 101". Sts a deep breath makes her chest hurt.

## 2012-03-08 NOTE — Telephone Encounter (Signed)
I spoke with the Jennifer [name] and she is c/o increased SOB and hoarseness and she is requesting an appt with Dr. Delford Field in [place]. I advised the Jennifer [name] there are not any appts available in [place] but I have an appt on Monday here in [place]. Jennifer [name] states she will try to get a ride so appt set for Monday at 3:15. Carron Curie, CMA

## 2012-03-09 DIAGNOSIS — Z8719 Personal history of other diseases of the digestive system: Secondary | ICD-10-CM | POA: Diagnosis not present

## 2012-03-09 DIAGNOSIS — Z881 Allergy status to other antibiotic agents status: Secondary | ICD-10-CM | POA: Diagnosis not present

## 2012-03-09 DIAGNOSIS — S40019A Contusion of unspecified shoulder, initial encounter: Secondary | ICD-10-CM | POA: Diagnosis not present

## 2012-03-09 DIAGNOSIS — S99929A Unspecified injury of unspecified foot, initial encounter: Secondary | ICD-10-CM | POA: Diagnosis not present

## 2012-03-09 DIAGNOSIS — Z888 Allergy status to other drugs, medicaments and biological substances status: Secondary | ICD-10-CM | POA: Diagnosis not present

## 2012-03-09 DIAGNOSIS — S99919A Unspecified injury of unspecified ankle, initial encounter: Secondary | ICD-10-CM | POA: Diagnosis not present

## 2012-03-09 DIAGNOSIS — M25569 Pain in unspecified knee: Secondary | ICD-10-CM | POA: Diagnosis not present

## 2012-03-09 DIAGNOSIS — IMO0002 Reserved for concepts with insufficient information to code with codable children: Secondary | ICD-10-CM | POA: Diagnosis not present

## 2012-03-09 DIAGNOSIS — Z79899 Other long term (current) drug therapy: Secondary | ICD-10-CM | POA: Diagnosis not present

## 2012-03-09 DIAGNOSIS — R05 Cough: Secondary | ICD-10-CM | POA: Diagnosis not present

## 2012-03-09 DIAGNOSIS — S79919A Unspecified injury of unspecified hip, initial encounter: Secondary | ICD-10-CM | POA: Diagnosis not present

## 2012-03-09 DIAGNOSIS — S59909A Unspecified injury of unspecified elbow, initial encounter: Secondary | ICD-10-CM | POA: Diagnosis not present

## 2012-03-09 DIAGNOSIS — S6990XA Unspecified injury of unspecified wrist, hand and finger(s), initial encounter: Secondary | ICD-10-CM | POA: Diagnosis not present

## 2012-03-09 DIAGNOSIS — S79929A Unspecified injury of unspecified thigh, initial encounter: Secondary | ICD-10-CM | POA: Diagnosis not present

## 2012-03-09 DIAGNOSIS — Z8709 Personal history of other diseases of the respiratory system: Secondary | ICD-10-CM | POA: Diagnosis not present

## 2012-03-09 DIAGNOSIS — Z880 Allergy status to penicillin: Secondary | ICD-10-CM | POA: Diagnosis not present

## 2012-03-09 DIAGNOSIS — S7000XA Contusion of unspecified hip, initial encounter: Secondary | ICD-10-CM | POA: Diagnosis not present

## 2012-03-09 DIAGNOSIS — S8990XA Unspecified injury of unspecified lower leg, initial encounter: Secondary | ICD-10-CM | POA: Diagnosis not present

## 2012-03-09 DIAGNOSIS — G894 Chronic pain syndrome: Secondary | ICD-10-CM | POA: Diagnosis not present

## 2012-03-09 DIAGNOSIS — S46909A Unspecified injury of unspecified muscle, fascia and tendon at shoulder and upper arm level, unspecified arm, initial encounter: Secondary | ICD-10-CM | POA: Diagnosis not present

## 2012-03-09 DIAGNOSIS — I10 Essential (primary) hypertension: Secondary | ICD-10-CM | POA: Diagnosis not present

## 2012-03-09 DIAGNOSIS — S60219A Contusion of unspecified wrist, initial encounter: Secondary | ICD-10-CM | POA: Diagnosis not present

## 2012-03-09 DIAGNOSIS — Z884 Allergy status to anesthetic agent status: Secondary | ICD-10-CM | POA: Diagnosis not present

## 2012-03-09 DIAGNOSIS — Z9109 Other allergy status, other than to drugs and biological substances: Secondary | ICD-10-CM | POA: Diagnosis not present

## 2012-03-09 DIAGNOSIS — S0990XA Unspecified injury of head, initial encounter: Secondary | ICD-10-CM | POA: Diagnosis not present

## 2012-03-09 DIAGNOSIS — S4980XA Other specified injuries of shoulder and upper arm, unspecified arm, initial encounter: Secondary | ICD-10-CM | POA: Diagnosis not present

## 2012-03-09 DIAGNOSIS — Z886 Allergy status to analgesic agent status: Secondary | ICD-10-CM | POA: Diagnosis not present

## 2012-03-09 DIAGNOSIS — Z882 Allergy status to sulfonamides status: Secondary | ICD-10-CM | POA: Diagnosis not present

## 2012-03-09 DIAGNOSIS — R059 Cough, unspecified: Secondary | ICD-10-CM | POA: Diagnosis not present

## 2012-03-09 DIAGNOSIS — Z8739 Personal history of other diseases of the musculoskeletal system and connective tissue: Secondary | ICD-10-CM | POA: Diagnosis not present

## 2012-03-09 DIAGNOSIS — S8000XA Contusion of unspecified knee, initial encounter: Secondary | ICD-10-CM | POA: Diagnosis not present

## 2012-03-09 DIAGNOSIS — R0602 Shortness of breath: Secondary | ICD-10-CM | POA: Diagnosis not present

## 2012-03-11 ENCOUNTER — Telehealth: Payer: Self-pay | Admitting: *Deleted

## 2012-03-11 ENCOUNTER — Ambulatory Visit: Payer: Medicare Other | Admitting: Critical Care Medicine

## 2012-03-11 DIAGNOSIS — M25569 Pain in unspecified knee: Secondary | ICD-10-CM | POA: Diagnosis not present

## 2012-03-11 DIAGNOSIS — T7800XA Anaphylactic reaction due to unspecified food, initial encounter: Secondary | ICD-10-CM | POA: Diagnosis not present

## 2012-03-11 DIAGNOSIS — J45909 Unspecified asthma, uncomplicated: Secondary | ICD-10-CM | POA: Diagnosis not present

## 2012-03-11 DIAGNOSIS — J309 Allergic rhinitis, unspecified: Secondary | ICD-10-CM | POA: Diagnosis not present

## 2012-03-11 DIAGNOSIS — L259 Unspecified contact dermatitis, unspecified cause: Secondary | ICD-10-CM | POA: Diagnosis not present

## 2012-03-11 NOTE — Telephone Encounter (Signed)
Pt notified and appt made for tomorrow. KG LPN

## 2012-03-11 NOTE — Telephone Encounter (Signed)
Patient calls and states she had a bad fall this weekend- said she "flew in the air and landed on concrete on her left side". Went to ED and done xrays and they were normal. Patient states she can not put in weight down on her left knee and if does hits the floor. Left shoulder is hurting still as well. Wants to know if she can get an MRI done of shoulder and knee or if you want to see her first. Please advise

## 2012-03-11 NOTE — Telephone Encounter (Signed)
Needs appt . Can see if Jennifer [name] has something this afternoon. Did they xray her knee specificially?

## 2012-03-12 ENCOUNTER — Ambulatory Visit (INDEPENDENT_AMBULATORY_CARE_PROVIDER_SITE_OTHER): Payer: Medicare Other | Admitting: Family Medicine

## 2012-03-12 ENCOUNTER — Telehealth: Payer: Self-pay | Admitting: *Deleted

## 2012-03-12 ENCOUNTER — Encounter: Payer: Self-pay | Admitting: Family Medicine

## 2012-03-12 VITALS — BP 146/98 | HR 76 | Ht 62.0 in | Wt 180.0 lb

## 2012-03-12 DIAGNOSIS — L301 Dyshidrosis [pompholyx]: Secondary | ICD-10-CM

## 2012-03-12 DIAGNOSIS — M25519 Pain in unspecified shoulder: Secondary | ICD-10-CM | POA: Diagnosis not present

## 2012-03-12 DIAGNOSIS — M25569 Pain in unspecified knee: Secondary | ICD-10-CM | POA: Diagnosis not present

## 2012-03-12 DIAGNOSIS — I10 Essential (primary) hypertension: Secondary | ICD-10-CM | POA: Diagnosis not present

## 2012-03-12 DIAGNOSIS — R42 Dizziness and giddiness: Secondary | ICD-10-CM | POA: Diagnosis not present

## 2012-03-12 MED ORDER — CLOTRIMAZOLE-BETAMETHASONE 1-0.05 % EX CREA: TOPICAL_CREAM | CUTANEOUS | Status: DC

## 2012-03-12 NOTE — Telephone Encounter (Signed)
OK, refill sent to pharmacy.

## 2012-03-12 NOTE — Progress Notes (Signed)
Subjective:    Patient ID: Jennifer [name], female    DOB: [birth year], 46 y.o.   MRN: [id number]  HPI Was walking around the car and that was a jack laying there and he fell.  Fell on her left side on concrete.  Left knee was very swollen but is now less swollen and also notices bruises on her right ankle.  Says it was several hours before she could put weight on her knee.  Says her knee is doing numb intermitantly.  Went to [place] ED.  Had xrays and there was no fracture of the knee or shoulder.  Still hs pain and weakness in her shoulder.   Right shoulder now feels weaker.  Has been soaking in hot warm bath. She thinks this may help. Not icing.  No other worsening or alleviating factors.  Rash that is itches on her legs, abdomen and arms. Recently switched back   She also has dizzness today.  She when she stands up. Had no syncope or presyncope. She's a little off balance. Aggravating or worsening symptoms. No headache. When she fell she says she doesn't member she hit her head or not.  She would like a refill on her foot cream that we rx about a year or two ago0.  Sys the rash is back.     Review of Systems     Objective:   Physical Exam  Constitutional: She is oriented to person, place, and time. She appears well-developed and well-nourished.  HENT:  Head: Normocephalic and atraumatic.  Cardiovascular: Normal rate, regular rhythm and normal heart sounds.   Pulmonary/Chest: Effort normal and breath sounds normal.  Musculoskeletal: She exhibits no edema.       Left shoulder with decreased extension. Otherwise normal internal and external rotation. The she does have pain at the extremes. Strength is slightly decreased at the shoulder on the left compared to the right. No actual bruising on the scan. She's mildly tender along the edge of the acromion. Left knew with some bruises just below the patella.  He is also tender along the bottom edge of the patella and over the patella. No medial  lateral joint line tenderness. No crepitus or popping with flexion and extension. Normal range of motion. Strength is 5 out of 5 but she does have pain with extension. No increased laxity of the joint with anterior and posterior drawer test.  Ankle with normal range of motion and strength. N  Neurological: She is alert and oriented to person, place, and time.  Skin: Skin is warm and dry.  Psychiatric: She has a normal mood and affect. Her behavior is normal.          Assessment & Plan:  Knee pain -her x-rays were negative for fracture. Certainly she could have some ligament or cartilage damage. Because she is still having difficulty bearing weight on the joint we will schedule for an MRI. Can use tyelnol or IBU for pain.   Shoulder pain- she's had previous problems with her shoulder but now complains of increased weakness. Will schedule for an MRI bc now having increased weakness.  REcommend ice instead of heat.    Dizziness. - Unclear etiology though she has a history of chronic recurrent dizziness. This is not new. Her blood pressure is up slightly today. Orthostatics are normal. Her blood pressures probably elevated because of pain and discomfort today.  Hypertension-she is also off her Micardis. She said she stopped it before the procedure and has not restarted  because she hasn't felt well. She says she plans to restart it. She says her blood pressure was normal last night.  Dyshidrotic eczema of the foot-will send her prescription for a combination clotrimazole/steroid cream.

## 2012-03-12 NOTE — Patient Instructions (Addendum)
Recommend ice the joints aggressively. 10 minutes at a time several times a day.  We will work on getting the schedule for MRI of the shoulder and the knee.

## 2012-03-12 NOTE — Telephone Encounter (Signed)
Pt calls and wants a refill on her foot cream. Can't remember what it was. Please send to her pharmacy. Clotrimazole dipronate cream

## 2012-03-13 DIAGNOSIS — R490 Dysphonia: Secondary | ICD-10-CM | POA: Diagnosis not present

## 2012-03-13 DIAGNOSIS — M889 Osteitis deformans of unspecified bone: Secondary | ICD-10-CM | POA: Diagnosis not present

## 2012-03-13 DIAGNOSIS — Z79899 Other long term (current) drug therapy: Secondary | ICD-10-CM | POA: Diagnosis not present

## 2012-03-13 DIAGNOSIS — J45909 Unspecified asthma, uncomplicated: Secondary | ICD-10-CM | POA: Diagnosis not present

## 2012-03-13 DIAGNOSIS — S8000XA Contusion of unspecified knee, initial encounter: Secondary | ICD-10-CM | POA: Diagnosis not present

## 2012-03-13 DIAGNOSIS — S8010XA Contusion of unspecified lower leg, initial encounter: Secondary | ICD-10-CM | POA: Diagnosis not present

## 2012-03-13 DIAGNOSIS — N946 Dysmenorrhea, unspecified: Secondary | ICD-10-CM | POA: Diagnosis not present

## 2012-03-13 DIAGNOSIS — S7000XA Contusion of unspecified hip, initial encounter: Secondary | ICD-10-CM | POA: Diagnosis not present

## 2012-03-13 DIAGNOSIS — IMO0002 Reserved for concepts with insufficient information to code with codable children: Secondary | ICD-10-CM | POA: Diagnosis not present

## 2012-03-14 ENCOUNTER — Telehealth: Payer: Self-pay | Admitting: Family Medicine

## 2012-03-14 DIAGNOSIS — M67919 Unspecified disorder of synovium and tendon, unspecified shoulder: Secondary | ICD-10-CM | POA: Diagnosis not present

## 2012-03-14 DIAGNOSIS — M19019 Primary osteoarthritis, unspecified shoulder: Secondary | ICD-10-CM | POA: Diagnosis not present

## 2012-03-14 DIAGNOSIS — L089 Local infection of the skin and subcutaneous tissue, unspecified: Secondary | ICD-10-CM | POA: Diagnosis not present

## 2012-03-14 DIAGNOSIS — L509 Urticaria, unspecified: Secondary | ICD-10-CM | POA: Diagnosis not present

## 2012-03-14 DIAGNOSIS — S7000XA Contusion of unspecified hip, initial encounter: Secondary | ICD-10-CM | POA: Diagnosis not present

## 2012-03-14 DIAGNOSIS — S8000XA Contusion of unspecified knee, initial encounter: Secondary | ICD-10-CM | POA: Diagnosis not present

## 2012-03-14 DIAGNOSIS — R0602 Shortness of breath: Secondary | ICD-10-CM | POA: Diagnosis not present

## 2012-03-14 DIAGNOSIS — S8010XA Contusion of unspecified lower leg, initial encounter: Secondary | ICD-10-CM | POA: Diagnosis not present

## 2012-03-14 DIAGNOSIS — I10 Essential (primary) hypertension: Secondary | ICD-10-CM | POA: Diagnosis not present

## 2012-03-14 DIAGNOSIS — R21 Rash and other nonspecific skin eruption: Secondary | ICD-10-CM | POA: Diagnosis not present

## 2012-03-14 DIAGNOSIS — R0609 Other forms of dyspnea: Secondary | ICD-10-CM | POA: Diagnosis not present

## 2012-03-14 DIAGNOSIS — M719 Bursopathy, unspecified: Secondary | ICD-10-CM | POA: Diagnosis not present

## 2012-03-14 DIAGNOSIS — R05 Cough: Secondary | ICD-10-CM | POA: Diagnosis not present

## 2012-03-14 DIAGNOSIS — N946 Dysmenorrhea, unspecified: Secondary | ICD-10-CM | POA: Diagnosis not present

## 2012-03-14 DIAGNOSIS — R059 Cough, unspecified: Secondary | ICD-10-CM | POA: Diagnosis not present

## 2012-03-14 DIAGNOSIS — M25569 Pain in unspecified knee: Secondary | ICD-10-CM | POA: Diagnosis not present

## 2012-03-14 DIAGNOSIS — R0989 Other specified symptoms and signs involving the circulatory and respiratory systems: Secondary | ICD-10-CM | POA: Diagnosis not present

## 2012-03-14 DIAGNOSIS — M25519 Pain in unspecified shoulder: Secondary | ICD-10-CM | POA: Diagnosis not present

## 2012-03-14 NOTE — Telephone Encounter (Signed)
Call patient: MRI of the shoulder shows no significant change from previous study in January of 2013. No new trauma or irritation. She only has a history of supraspinatous tendinitis and some a.c. joint arthritis. These are just likely irritated and inflamed from the fall but there is no additional injury or trauma. Continue to ice aggressively

## 2012-03-14 NOTE — Telephone Encounter (Signed)
Call patient: MRI of her left knee is normal. Muscle and tendon is normal. No defects in the cartilage. No significant swelling inside the joint.Thus just contusoin and should gradually get better in 2-3 weeks.

## 2012-03-14 NOTE — Telephone Encounter (Signed)
Pt.notified

## 2012-03-15 DIAGNOSIS — J45909 Unspecified asthma, uncomplicated: Secondary | ICD-10-CM | POA: Diagnosis not present

## 2012-03-15 DIAGNOSIS — K2270 Barrett's esophagus without dysplasia: Secondary | ICD-10-CM | POA: Diagnosis not present

## 2012-03-15 DIAGNOSIS — Z888 Allergy status to other drugs, medicaments and biological substances status: Secondary | ICD-10-CM | POA: Diagnosis not present

## 2012-03-15 DIAGNOSIS — Z8739 Personal history of other diseases of the musculoskeletal system and connective tissue: Secondary | ICD-10-CM | POA: Diagnosis not present

## 2012-03-15 DIAGNOSIS — K219 Gastro-esophageal reflux disease without esophagitis: Secondary | ICD-10-CM | POA: Diagnosis not present

## 2012-03-15 DIAGNOSIS — Z886 Allergy status to analgesic agent status: Secondary | ICD-10-CM | POA: Diagnosis not present

## 2012-03-15 DIAGNOSIS — Z881 Allergy status to other antibiotic agents status: Secondary | ICD-10-CM | POA: Diagnosis not present

## 2012-03-15 DIAGNOSIS — Z9109 Other allergy status, other than to drugs and biological substances: Secondary | ICD-10-CM | POA: Diagnosis not present

## 2012-03-15 DIAGNOSIS — Z79899 Other long term (current) drug therapy: Secondary | ICD-10-CM | POA: Diagnosis not present

## 2012-03-15 DIAGNOSIS — J069 Acute upper respiratory infection, unspecified: Secondary | ICD-10-CM | POA: Diagnosis not present

## 2012-03-15 DIAGNOSIS — R0602 Shortness of breath: Secondary | ICD-10-CM | POA: Diagnosis not present

## 2012-03-15 DIAGNOSIS — J019 Acute sinusitis, unspecified: Secondary | ICD-10-CM | POA: Diagnosis not present

## 2012-03-15 DIAGNOSIS — Z880 Allergy status to penicillin: Secondary | ICD-10-CM | POA: Diagnosis not present

## 2012-03-15 NOTE — Telephone Encounter (Signed)
Pt notified of results

## 2012-03-18 DIAGNOSIS — F339 Major depressive disorder, recurrent, unspecified: Secondary | ICD-10-CM | POA: Diagnosis not present

## 2012-03-18 NOTE — MAU Provider Note (Signed)
Medical Screening exam and patient care preformed by advanced practice provider.  Agree with the above management.  

## 2012-03-20 ENCOUNTER — Encounter (HOSPITAL_BASED_OUTPATIENT_CLINIC_OR_DEPARTMENT_OTHER): Payer: Self-pay | Admitting: *Deleted

## 2012-03-20 ENCOUNTER — Emergency Department (HOSPITAL_BASED_OUTPATIENT_CLINIC_OR_DEPARTMENT_OTHER)
Admission: EM | Admit: 2012-03-20 | Discharge: 2012-03-20 | Disposition: A | Discharge status: Home / Self Care | Payer: Medicare Other | Attending: Emergency Medicine | Admitting: Emergency Medicine

## 2012-03-20 ENCOUNTER — Emergency Department (HOSPITAL_BASED_OUTPATIENT_CLINIC_OR_DEPARTMENT_OTHER): Payer: Medicare Other

## 2012-03-20 DIAGNOSIS — E785 Hyperlipidemia, unspecified: Secondary | ICD-10-CM | POA: Diagnosis not present

## 2012-03-20 DIAGNOSIS — Z87891 Personal history of nicotine dependence: Secondary | ICD-10-CM | POA: Diagnosis not present

## 2012-03-20 DIAGNOSIS — Z91010 Allergy to peanuts: Secondary | ICD-10-CM | POA: Insufficient documentation

## 2012-03-20 DIAGNOSIS — Z880 Allergy status to penicillin: Secondary | ICD-10-CM | POA: Diagnosis not present

## 2012-03-20 DIAGNOSIS — I10 Essential (primary) hypertension: Secondary | ICD-10-CM | POA: Insufficient documentation

## 2012-03-20 DIAGNOSIS — J45909 Unspecified asthma, uncomplicated: Secondary | ICD-10-CM | POA: Insufficient documentation

## 2012-03-20 DIAGNOSIS — R220 Localized swelling, mass and lump, head: Secondary | ICD-10-CM | POA: Diagnosis not present

## 2012-03-20 DIAGNOSIS — K219 Gastro-esophageal reflux disease without esophagitis: Secondary | ICD-10-CM | POA: Diagnosis not present

## 2012-03-20 DIAGNOSIS — M2669 Other specified disorders of temporomandibular joint: Secondary | ICD-10-CM | POA: Insufficient documentation

## 2012-03-20 DIAGNOSIS — Z043 Encounter for examination and observation following other accident: Secondary | ICD-10-CM | POA: Diagnosis not present

## 2012-03-20 DIAGNOSIS — R221 Localized swelling, mass and lump, neck: Secondary | ICD-10-CM | POA: Diagnosis not present

## 2012-03-20 NOTE — ED Notes (Signed)
Pt amb to room 6 with quick steady gait in nad. Pt reports trip and fall 2 weeks ago, and has had jaw pain since then. Pt states she is unsure if she hit her jaw with the fall. Pt states she has been seen at [facility] er for same several times, "and they just shoo me out the door like they always do.Marland KitchenMarland Kitchen"

## 2012-03-20 NOTE — ED Notes (Signed)
Patient reports a "bad fall", jaw pain, facial swelling, facial numbness, high blood pressure.
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
## 2012-03-21 DIAGNOSIS — G501 Atypical facial pain: Secondary | ICD-10-CM | POA: Diagnosis not present

## 2012-03-21 DIAGNOSIS — M542 Cervicalgia: Secondary | ICD-10-CM | POA: Diagnosis not present

## 2012-03-21 DIAGNOSIS — R51 Headache: Secondary | ICD-10-CM | POA: Diagnosis not present

## 2012-03-25 DIAGNOSIS — R05 Cough: Secondary | ICD-10-CM | POA: Diagnosis not present

## 2012-03-25 DIAGNOSIS — I10 Essential (primary) hypertension: Secondary | ICD-10-CM | POA: Diagnosis not present

## 2012-03-25 DIAGNOSIS — R002 Palpitations: Secondary | ICD-10-CM | POA: Diagnosis not present

## 2012-03-25 DIAGNOSIS — Z79899 Other long term (current) drug therapy: Secondary | ICD-10-CM | POA: Diagnosis not present

## 2012-03-25 DIAGNOSIS — E269 Hyperaldosteronism, unspecified: Secondary | ICD-10-CM | POA: Diagnosis not present

## 2012-03-25 DIAGNOSIS — R0602 Shortness of breath: Secondary | ICD-10-CM | POA: Diagnosis not present

## 2012-03-25 DIAGNOSIS — R059 Cough, unspecified: Secondary | ICD-10-CM | POA: Diagnosis not present

## 2012-03-25 DIAGNOSIS — G894 Chronic pain syndrome: Secondary | ICD-10-CM | POA: Diagnosis not present

## 2012-03-25 DIAGNOSIS — Z87442 Personal history of urinary calculi: Secondary | ICD-10-CM | POA: Diagnosis not present

## 2012-03-25 DIAGNOSIS — G4730 Sleep apnea, unspecified: Secondary | ICD-10-CM | POA: Diagnosis not present

## 2012-03-25 DIAGNOSIS — F459 Somatoform disorder, unspecified: Secondary | ICD-10-CM | POA: Diagnosis not present

## 2012-03-26 DIAGNOSIS — R51 Headache: Secondary | ICD-10-CM | POA: Diagnosis not present

## 2012-03-27 ENCOUNTER — Emergency Department (HOSPITAL_COMMUNITY)
Admission: EM | Admit: 2012-03-27 | Discharge: 2012-03-28 | Disposition: A | Discharge status: Home / Self Care | Payer: Medicare Other | Attending: Emergency Medicine | Admitting: Emergency Medicine

## 2012-03-27 ENCOUNTER — Encounter (HOSPITAL_COMMUNITY): Payer: Self-pay | Admitting: Emergency Medicine

## 2012-03-27 DIAGNOSIS — E785 Hyperlipidemia, unspecified: Secondary | ICD-10-CM | POA: Diagnosis not present

## 2012-03-27 DIAGNOSIS — I10 Essential (primary) hypertension: Secondary | ICD-10-CM | POA: Diagnosis not present

## 2012-03-27 DIAGNOSIS — G4730 Sleep apnea, unspecified: Secondary | ICD-10-CM | POA: Insufficient documentation

## 2012-03-27 DIAGNOSIS — Z87891 Personal history of nicotine dependence: Secondary | ICD-10-CM | POA: Diagnosis not present

## 2012-03-27 DIAGNOSIS — Z9089 Acquired absence of other organs: Secondary | ICD-10-CM | POA: Diagnosis not present

## 2012-03-27 DIAGNOSIS — M542 Cervicalgia: Secondary | ICD-10-CM | POA: Diagnosis not present

## 2012-03-27 DIAGNOSIS — J45909 Unspecified asthma, uncomplicated: Secondary | ICD-10-CM | POA: Insufficient documentation

## 2012-03-27 DIAGNOSIS — K219 Gastro-esophageal reflux disease without esophagitis: Secondary | ICD-10-CM | POA: Diagnosis not present

## 2012-03-27 DIAGNOSIS — R1031 Right lower quadrant pain: Secondary | ICD-10-CM | POA: Diagnosis not present

## 2012-03-27 DIAGNOSIS — D259 Leiomyoma of uterus, unspecified: Secondary | ICD-10-CM | POA: Diagnosis not present

## 2012-03-27 DIAGNOSIS — I658 Occlusion and stenosis of other precerebral arteries: Secondary | ICD-10-CM | POA: Diagnosis not present

## 2012-03-27 DIAGNOSIS — R1032 Left lower quadrant pain: Secondary | ICD-10-CM | POA: Diagnosis not present

## 2012-03-27 DIAGNOSIS — R209 Unspecified disturbances of skin sensation: Secondary | ICD-10-CM | POA: Diagnosis not present

## 2012-03-27 DIAGNOSIS — F339 Major depressive disorder, recurrent, unspecified: Secondary | ICD-10-CM | POA: Diagnosis not present

## 2012-03-27 DIAGNOSIS — R109 Unspecified abdominal pain: Secondary | ICD-10-CM

## 2012-03-27 DIAGNOSIS — I6529 Occlusion and stenosis of unspecified carotid artery: Secondary | ICD-10-CM | POA: Diagnosis not present

## 2012-03-27 LAB — URINALYSIS, ROUTINE W REFLEX MICROSCOPIC
Bilirubin Urine: NEGATIVE
Glucose, UA: NEGATIVE mg/dL
Hgb urine dipstick: NEGATIVE
Ketones, ur: NEGATIVE mg/dL
Leukocytes, UA: NEGATIVE
Nitrite: NEGATIVE
Protein, ur: NEGATIVE mg/dL
Specific Gravity, Urine: 1.036 — ABNORMAL HIGH (ref 1.005–1.030)
Urobilinogen, UA: 1 mg/dL (ref 0.0–1.0)
pH: 5.5 (ref 5.0–8.0)

## 2012-03-27 LAB — BASIC METABOLIC PANEL
BUN: 14 mg/dL (ref 6–23)
CO2: 25 mEq/L (ref 19–32)
Calcium: 9.3 mg/dL (ref 8.4–10.5)
Chloride: 104 mEq/L (ref 96–112)
Creatinine, Ser: 0.53 mg/dL (ref 0.50–1.10)
GFR calc Af Amer: >90 mL/min (ref >90)
GFR calc non Af Amer: >90 mL/min (ref >90)
Glucose, Bld: 106 mg/dL — ABNORMAL HIGH (ref 70–99)
Potassium: 3.8 mEq/L (ref 3.5–5.1)
Sodium: 139 mEq/L (ref 135–145)

## 2012-03-27 LAB — CBC WITH DIFFERENTIAL/PLATELET
Basophils Absolute: 0 10*3/uL (ref 0.0–0.1)
Basophils Relative: 0 % (ref 0–1)
Eosinophils Absolute: 0.3 10*3/uL (ref 0.0–0.7)
Eosinophils Relative: 3 % (ref 0–5)
HCT: 39.3 % (ref 36.0–46.0)
Hemoglobin: 13.1 g/dL (ref 12.0–15.0)
Lymphocytes Relative: 20 % (ref 12–46)
Lymphs Abs: 1.8 10*3/uL (ref 0.7–4.0)
MCH: 29.3 pg (ref 26.0–34.0)
MCHC: 33.3 g/dL (ref 30.0–36.0)
MCV: 87.9 fL (ref 78.0–100.0)
Monocytes Absolute: 0.9 10*3/uL (ref 0.1–1.0)
Monocytes Relative: 10 % (ref 3–12)
Neutro Abs: 6.2 10*3/uL (ref 1.7–7.7)
Neutrophils Relative %: 68 % (ref 43–77)
Platelets: 219 10*3/uL (ref 150–400)
RBC: 4.47 MIL/uL (ref 3.87–5.11)
RDW: 14.2 % (ref 11.5–15.5)
WBC: 9.2 10*3/uL (ref 4.0–10.5)

## 2012-03-27 LAB — POCT PREGNANCY, URINE: Preg Test, Ur: NEGATIVE

## 2012-03-27 MED ORDER — ONDANSETRON HCL 4 MG/2ML IJ SOLN
4.0000 mg | Freq: Once | INTRAMUSCULAR | Status: DC
  Filled 2012-03-27 (×2): qty 2

## 2012-03-27 MED ORDER — ONDANSETRON 8 MG PO TBDP
8.0000 mg | ORAL_TABLET | Freq: Once | ORAL | Status: AC
  Administered 2012-03-28: 8 mg via ORAL
  Filled 2012-03-27: qty 1

## 2012-03-27 MED ORDER — OXYCODONE-ACETAMINOPHEN 5-325 MG PO TABS: 2.0000 | ORAL_TABLET | Freq: Once | ORAL | Status: DC

## 2012-03-27 NOTE — ED Notes (Signed)
Went to bedside to start IV. Patient declined to have IV started. States that if she is just getting the IV for Zofran then she would rather take it PO. [place], PA notified.

## 2012-03-27 NOTE — ED Notes (Signed)
In to talk with pt-pt states she declines to have a pelvic exam by a female PA or MD-also declines to wear hospital gown-states that the "detergent that the linen is washed in gives me a rash". No other needs at this time

## 2012-03-27 NOTE — ED Notes (Signed)
Pt c/o R lower quad pain radiating to R flank, pt states she does have some pain on left as well. Pt states she did note after BM today pink on tissue. Pt states she also has mucus in BM. Pt states she is suppose to meet with surgeon this week for hysterectomy
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
## 2012-03-28 ENCOUNTER — Emergency Department (HOSPITAL_COMMUNITY): Payer: Medicare Other

## 2012-03-28 ENCOUNTER — Encounter: Payer: Self-pay | Admitting: Obstetrics & Gynecology

## 2012-03-28 ENCOUNTER — Ambulatory Visit (INDEPENDENT_AMBULATORY_CARE_PROVIDER_SITE_OTHER): Payer: Medicare Other | Admitting: Obstetrics & Gynecology

## 2012-03-28 VITALS — BP 134/90 | HR 104 | Temp 98.8°F | Ht 62.0 in | Wt 181.1 lb

## 2012-03-28 DIAGNOSIS — D259 Leiomyoma of uterus, unspecified: Secondary | ICD-10-CM | POA: Diagnosis not present

## 2012-03-28 DIAGNOSIS — N920 Excessive and frequent menstruation with regular cycle: Secondary | ICD-10-CM | POA: Diagnosis not present

## 2012-03-28 DIAGNOSIS — N949 Unspecified condition associated with female genital organs and menstrual cycle: Secondary | ICD-10-CM | POA: Diagnosis not present

## 2012-03-28 DIAGNOSIS — R102 Pelvic and perineal pain: Secondary | ICD-10-CM

## 2012-03-28 DIAGNOSIS — D219 Benign neoplasm of connective and other soft tissue, unspecified: Secondary | ICD-10-CM

## 2012-03-28 MED ORDER — HYDROCODONE-ACETAMINOPHEN 5-325 MG PO TABS: 1.0000 | ORAL_TABLET | ORAL | Status: AC | PRN

## 2012-03-28 NOTE — Progress Notes (Signed)
Subjective:     Patient ID: Jennifer [name], female   DOB: [birth year], 46 y.o.   MRN: [id number]  HPI Pt c/o    Review of Systems     Objective:   Physical Exam Lungs: CTA CV: RRR Abd: soft , NT, ND GU: EGBUS: no lesions Vagina: no blood in vault Cervix: no lesion;  Uterus: sl enlarged, adequate descensus; mobile; no palpable fibroids Adnexa: no masses; sl tender   US Transvaginal Non-ob  03/28/2012  *RADIOLOGY REPORT*  Clinical Data: Abdominal pain.  TRANSABDOMINAL AND TRANSVAGINAL ULTRASOUND OF PELVIS Technique:  Both transabdominal and transvaginal ultrasound examinations of the pelvis were performed. Transabdominal technique was performed for global imaging of the pelvis including uterus, ovaries, adnexal regions, and pelvic cul-de-sac.  It was necessary to proceed with endovaginal exam following the transabdominal exam to visualize the U S.  Comparison:    03/04/2012  Findings:  Uterus: 4.9 x 7.4 x 8.9 cm, containing a 12 x 16 x 17 mm fibroid in the right anterior body, a 16 x 17 mm fibroid which is probably submucosal, and a 21 x 23 x 32 mm fibroid in the posterior left body.  Endometrium: 9 mm in thickness, unremarkable.  Right ovary:  2 x 2.8 x 3.6 cm, unremarkable.  Left ovary: Not seen on transabdominal or transvaginal scanning.  Other findings: There is a small amount of free pelvic fluid.  IMPRESSION:  1.  Uterine fibroids as previously described. 2.  Unremarkable right ovary. 3.  Left ovary not visualized.  Original Report Authenticated By: Osa Craver, M.D.   US Pelvis Complete  03/28/2012  *RADIOLOGY REPORT*  Clinical Data: Abdominal pain.  TRANSABDOMINAL AND TRANSVAGINAL ULTRASOUND OF PELVIS Technique:  Both transabdominal and transvaginal ultrasound examinations of the pelvis were performed. Transabdominal technique was performed for global imaging of the pelvis including uterus, ovaries, adnexal regions, and pelvic cul-de-sac.  It was necessary to proceed with  endovaginal exam following the transabdominal exam to visualize the U S.  Comparison:    03/04/2012  Findings:  Uterus: 4.9 x 7.4 x 8.9 cm, containing a 12 x 16 x 17 mm fibroid in the right anterior body, a 16 x 17 mm fibroid which is probably submucosal, and a 21 x 23 x 32 mm fibroid in the posterior left body.  Endometrium: 9 mm in thickness, unremarkable.  Right ovary:  2 x 2.8 x 3.6 cm, unremarkable.  Left ovary: Not seen on transabdominal or transvaginal scanning.  Other findings: There is a small amount of free pelvic fluid.  IMPRESSION:  1.  Uterine fibroids as previously described. 2.  Unremarkable right ovary. 3.  Left ovary not visualized.  Original Report Authenticated By: Osa Craver, M.D.       Assessment:     menorrhagia and pelvic pain in pt with multiple medical problems. Small fibroids noted on [place] ow normal path.  Left ovary not visualized but, seen and NOT enlarged 7/13. d/w pt treatment options including hyst, endometrial ablation and Mirena IUS.  Pt will f/u for Mirena     Plan:     F/u for Mirena at next available appt

## 2012-03-28 NOTE — ED Notes (Signed)
Patient transported to Ultrasound 

## 2012-03-28 NOTE — Patient Instructions (Addendum)
Levonorgestrel intrauterine device (IUD) What is this medicine? LEVONORGESTREL IUD (LEE voe nor jes trel) is a contraceptive (birth control) device. It is used to prevent pregnancy and to treat heavy bleeding that occurs during your period. It can be used for up to 5 years. This medicine may be used for other purposes; ask your health care provider or pharmacist if you have questions. What should I tell my health care provider before I take this medicine? They need to know if you have any of these conditions: -abnormal Pap smear -cancer of the breast, uterus, or cervix -diabetes -endometritis -genital or pelvic infection now or in the past -have more than one sexual partner or your partner has more than one partner -heart disease -history of an ectopic or tubal pregnancy -immune system problems -IUD in place -liver disease or tumor -problems with blood clots or take blood-thinners -use intravenous drugs -uterus of unusual shape -vaginal bleeding that has not been explained -an unusual or allergic reaction to levonorgestrel, other hormones, silicone, or polyethylene, medicines, foods, dyes, or preservatives -pregnant or trying to get pregnant -breast-feeding How should I use this medicine? This device is placed inside the uterus by a health care professional. Talk to your pediatrician regarding the use of this medicine in children. Special care may be needed. Overdosage: If you think you have taken too much of this medicine contact a poison control center or emergency room at once. NOTE: This medicine is only for you. Do not share this medicine with others. What if I miss a dose? This does not apply. What may interact with this medicine? Do not take this medicine with any of the following medications: -amprenavir -bosentan -fosamprenavir This medicine may also interact with the following medications: -aprepitant -barbiturate medicines for inducing sleep or treating  seizures -bexarotene -griseofulvin -medicines to treat seizures like carbamazepine, ethotoin, felbamate, oxcarbazepine, phenytoin, topiramate -modafinil -pioglitazone -rifabutin -rifampin -rifapentine -some medicines to treat HIV infection like atazanavir, indinavir, lopinavir, nelfinavir, tipranavir, ritonavir -St. John's wort -warfarin This list may not describe all possible interactions. Give your health care provider a list of all the medicines, herbs, non-prescription drugs, or dietary supplements you use. Also tell them if you smoke, drink alcohol, or use illegal drugs. Some items may interact with your medicine. What should I watch for while using this medicine? Visit your doctor or health care professional for regular check ups. See your doctor if you or your partner has sexual contact with others, becomes HIV positive, or gets a sexual transmitted disease. This product does not protect you against HIV infection (AIDS) or other sexually transmitted diseases. You can check the placement of the IUD yourself by reaching up to the top of your vagina with clean fingers to feel the threads. Do not pull on the threads. It is a good habit to check placement after each menstrual period. Call your doctor right away if you feel more of the IUD than just the threads or if you cannot feel the threads at all. The IUD may come out by itself. You may become pregnant if the device comes out. If you notice that the IUD has come out use a backup birth control method like condoms and call your health care provider. Using tampons will not change the position of the IUD and are okay to use during your period. What side effects may I notice from receiving this medicine? Side effects that you should report to your doctor or health care professional as soon as possible: -allergic reactions   like skin rash, itching or hives, swelling of the face, lips, or tongue -fever, flu-like symptoms -genital sores -high  blood pressure -no menstrual period for 6 weeks during use -pain, swelling, warmth in the leg -pelvic pain or tenderness -severe or sudden headache -signs of pregnancy -stomach cramping -sudden shortness of breath -trouble with balance, talking, or walking -unusual vaginal bleeding, discharge -yellowing of the eyes or skin Side effects that usually do not require medical attention (report to your doctor or health care professional if they continue or are bothersome): -acne -breast pain -change in sex drive or performance -changes in weight -cramping, dizziness, or faintness while the device is being inserted -headache -irregular menstrual bleeding within first 3 to 6 months of use -nausea This list may not describe all possible side effects. Call your doctor for medical advice about side effects. You may report side effects to FDA at 1-800-FDA-1088. Where should I keep my medicine? This does not apply. NOTE: This sheet is a summary. It may not cover all possible information. If you have questions about this medicine, talk to your doctor, pharmacist, or health care provider.  2012, [employer]. (08/21/2008 6:39:08 PM) 

## 2012-03-29 ENCOUNTER — Ambulatory Visit (INDEPENDENT_AMBULATORY_CARE_PROVIDER_SITE_OTHER): Payer: Medicare Other

## 2012-03-29 ENCOUNTER — Encounter: Payer: Self-pay | Admitting: Sports Medicine

## 2012-03-29 ENCOUNTER — Ambulatory Visit (INDEPENDENT_AMBULATORY_CARE_PROVIDER_SITE_OTHER): Payer: Medicare Other | Admitting: Sports Medicine

## 2012-03-29 VITALS — BP 138/93 | HR 100 | Temp 98.6°F | Resp 18 | Wt 182.0 lb

## 2012-03-29 DIAGNOSIS — M25559 Pain in unspecified hip: Secondary | ICD-10-CM

## 2012-03-29 DIAGNOSIS — M545 Low back pain, unspecified: Secondary | ICD-10-CM

## 2012-03-29 DIAGNOSIS — M5137 Other intervertebral disc degeneration, lumbosacral region: Secondary | ICD-10-CM | POA: Diagnosis not present

## 2012-03-29 DIAGNOSIS — R109 Unspecified abdominal pain: Secondary | ICD-10-CM

## 2012-03-29 NOTE — Addendum Note (Signed)
Addended by: Monica Becton on: 03/29/2012 10:08 AM   Modules accepted: Level of Service

## 2012-03-29 NOTE — Assessment & Plan Note (Addendum)
Symptoms are suggestive of an L3, or an L4 lumbar radiculopathy on the right side. Some of her exam is also suggestive of a right-sided hip flexor tendinosis. I would like her to do formal physical therapy, 2-3 times a week for 4-6 weeks. The therapists will work on both lumbar stability for radiculopathy, as well as hip flexor rehabilitation. I would also like them to work on her hip abductors. She declines any oral medications at this time, and notes significant mood changes with steroids. She has not yet had Decadron, and this may be a possibility if needed. I did send her for an x-ray right now as well. She can come back to see me if needed in 4- 6 weeks, if no better I think it would be prudent to check an MRI of her lumbar spine. I will continue to keep intra-abdominal pathology in the back of my mind.

## 2012-03-29 NOTE — Progress Notes (Signed)
Patient ID: Jennifer [name], female   DOB: 04/23/1966, 46 y.o.   MRN: [id number]  Subjective:    I'm seeing this patient as a consultation for:  Dr. Linford Arnold  CC: right hip pain  [id number] is a pleasant 16 her old female who comes in with complaints of pain that she localizes in her right buttock, right groin radiating around the anterior aspect of her right thigh, anteromedial knee, and medial lower leg resulting in numbness and tingling. This is been present for approximately one month. She remembers an episode of tripping and falling, and subsequently developing this pain later.  Now it's very difficult to sit in a car for long periods of time. Pain is not worsened with Valsalva, and it's not worsened with bending backwards. She does have some pain in her back. She did go to see her OB/GYN who diagnosed her with uterine fibroids, but suspected as well that her pain may be musculoskeletal in nature. She also endorses that she's had an MRI nearly a decade ago that was consistent with a disc protrusion, but at this point we are unsure which disc that was.  Past medical history, Surgical history, Family history, Social history, Allergies, and medications have been entered into the medical record, reviewed, and no changes needed.   Review of Systems: No headache, visual changes, nausea, vomiting, diarrhea, constipation, dizziness, abdominal pain, skin rash, fevers, chills, night sweats, weight loss, body aches, joint swelling, muscle aches, chest pain, or shortness of breath.   Objective:   Vitals:  Afebrile, vital signs stable. General: Well Developed, well nourished, and in no acute distress.  Neuro: Alert and oriented x3, extra-ocular muscles intact.  Skin: Warm and dry, no rashes noted.  Respiratory: Not using accessory muscles, speaking in full sentences.  Cardiovascular: Pulses palpable, no extremity edema. Back Exam:  Inspection: Unremarkable  Motion: Flexion 45 deg, Extension 45 deg,  Side Bending to 45 deg bilaterally,  Rotation to 45 deg bilaterally  SLR laying: reproduces pain and back, but not radicular symptoms. XSLR laying: Negative  Palpable tenderness: None. FABER: negative. Sensory change: Gross sensation intact to all lumbar and sacral dermatomes.  Reflexes: 2+ at both patellar tendons, 2+ at achilles tendons, Babinski's downgoing.  Strength at foot  Plantar-flexion: 5/5 Dorsi-flexion: 5/5 Eversion: 5/5 Inversion: 5/5  Leg strength  Quad: 5/5 Hamstring: 5/5 Hip flexor: 5/5, resisted hip flexion on the right side does reproduce some pain. Hip abductors: 5/5  Gait unremarkable.  She has excellent internal and external rotation of both hips, and has no tenderness to palpation over greater trochanter on either side.  Impression and Recommendations:

## 2012-03-30 IMAGING — CT CT ANGIO CHEST
2 of 6 series · 19 of 36 positions shown · IV contrast (omnipaque)
Comparison: Chest x-ray earlier today.

CLINICAL DATA: Shortness of breath.  Leg swelling.

CT ANGIOGRAPHY CHEST WITH CONTRAST
TECHNIQUE: Multidetector CT imaging of the chest was performed
using the standard protocol during bolus administration of
intravenous contrast.  Multiplanar CT image reconstructions
including MIPs were obtained to evaluate the vascular anatomy.
Contrast:  75 ml Omnipaque 300 IV.

[Series 5: pe · axial · 0.68mm/px · z∈[-261,-27]mm · 18 of 127 slices shown]
[im 5/127  lung]
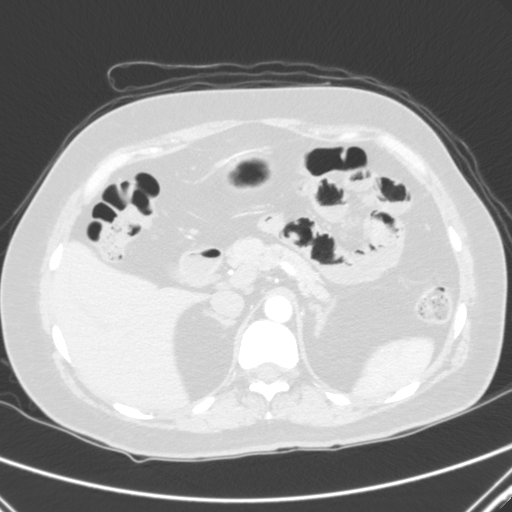
[im 15/127  mediastinal]
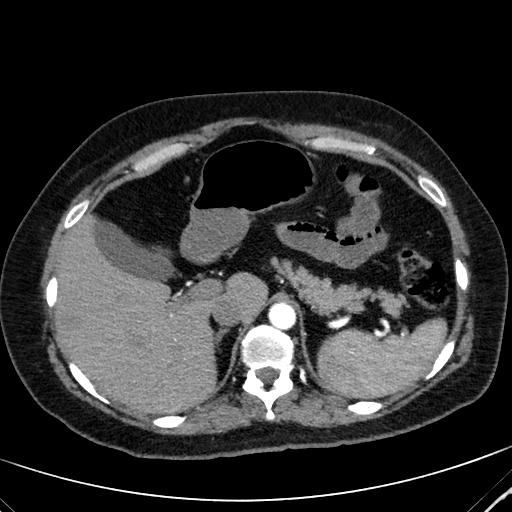
[im 20/127  lung]
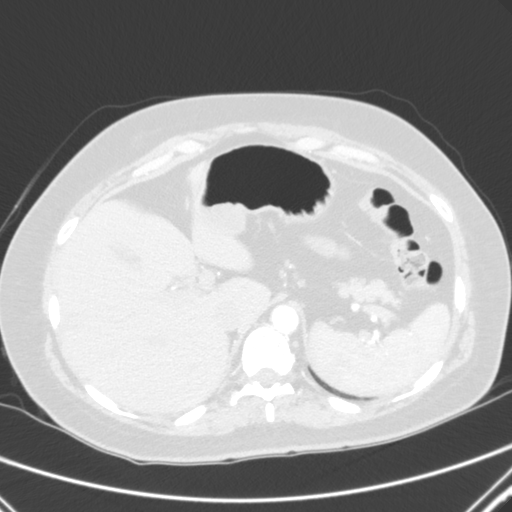
[im 25/127  mediastinal]
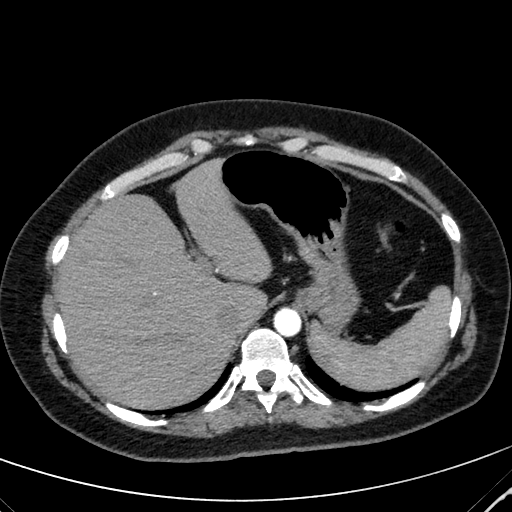
[im 34/127  lung]
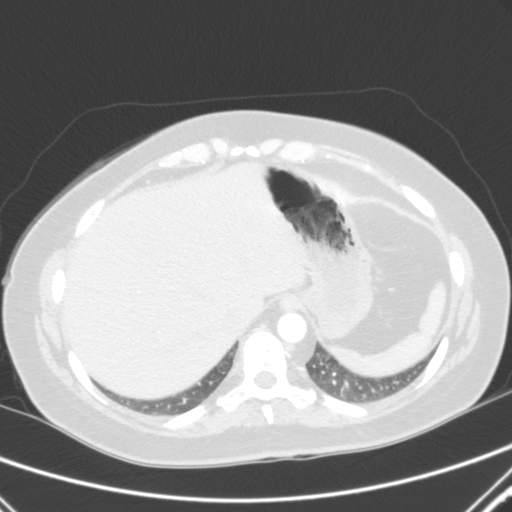
[im 39/127  mediastinal]
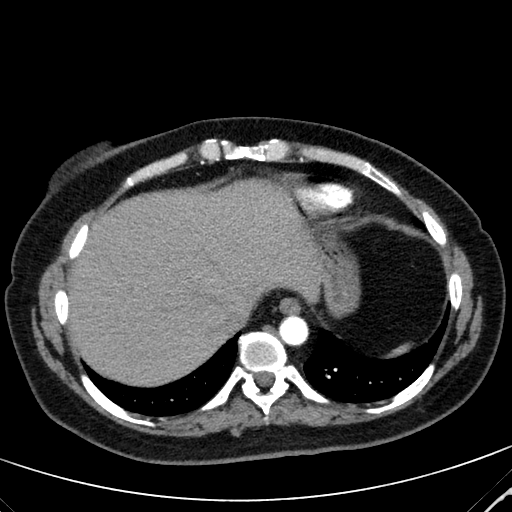
[im 49/127  lung]
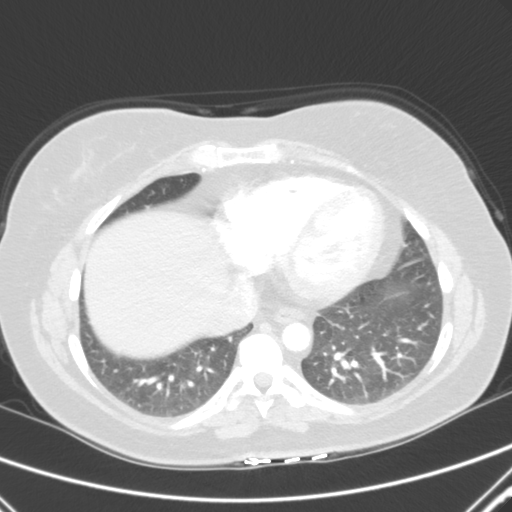
[im 54/127  mediastinal]
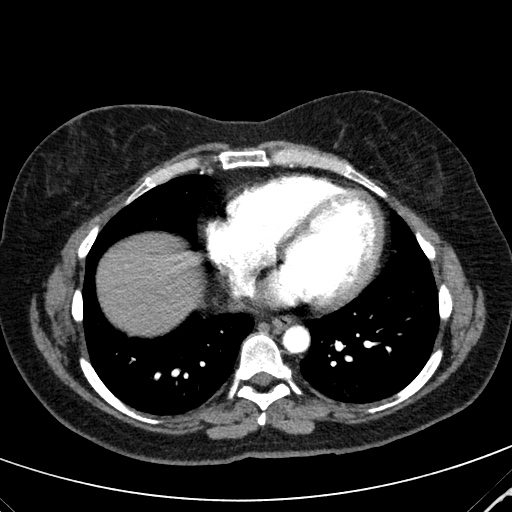
[im 59/127  lung]
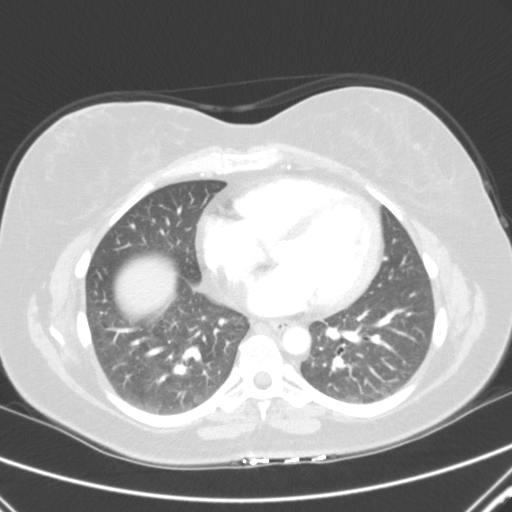
[im 68/127  mediastinal]
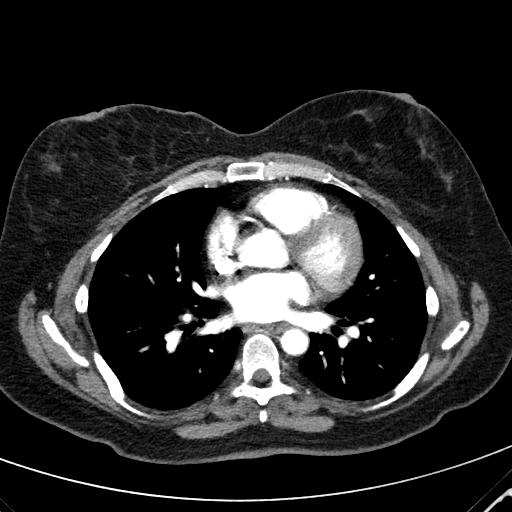
[im 73/127  lung]
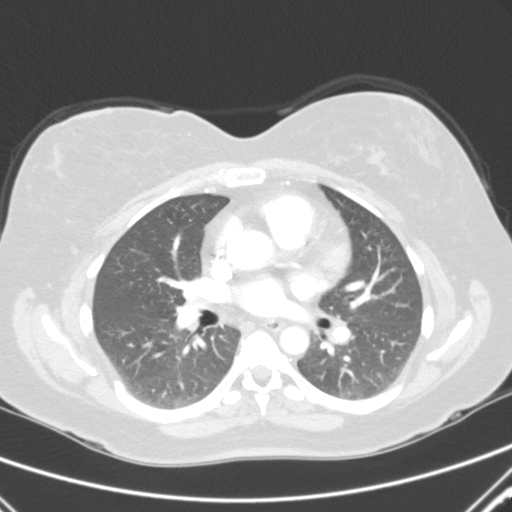
[im 78/127  mediastinal]
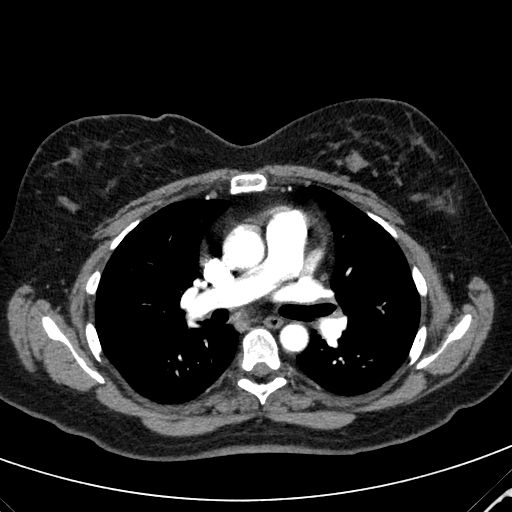
[im 88/127  lung]
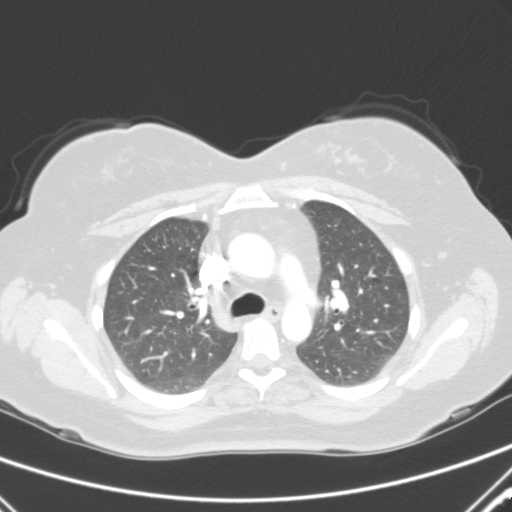
[im 93/127  mediastinal]
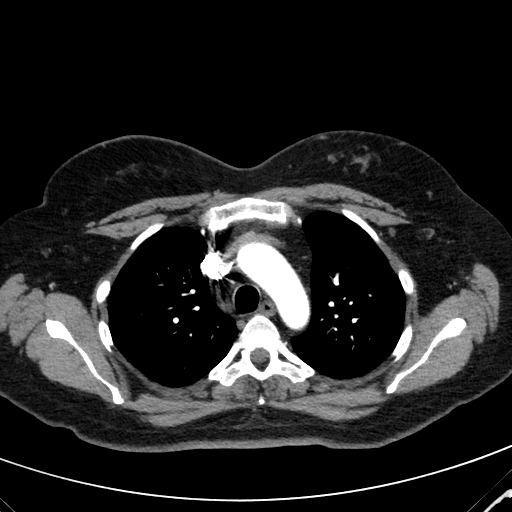
[im 102/127  lung]
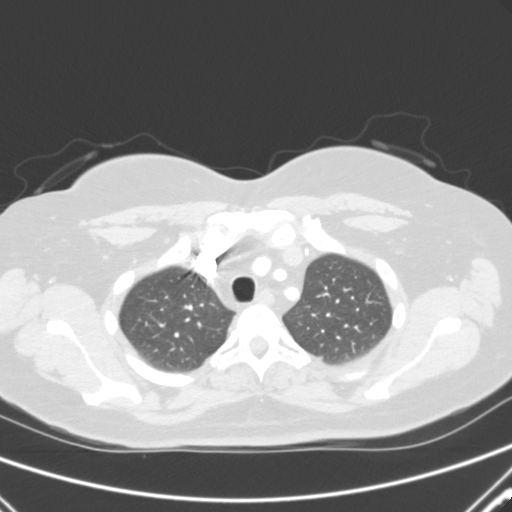
[im 107/127  mediastinal]
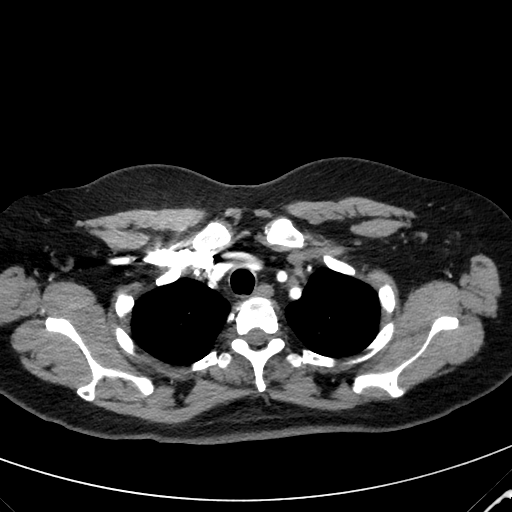
[im 112/127  lung]
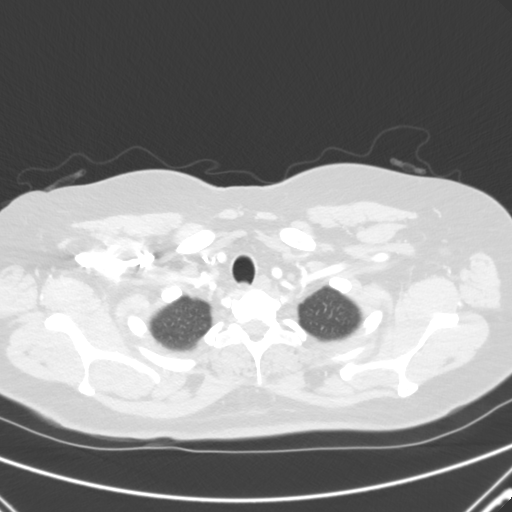
[im 122/127  mediastinal]
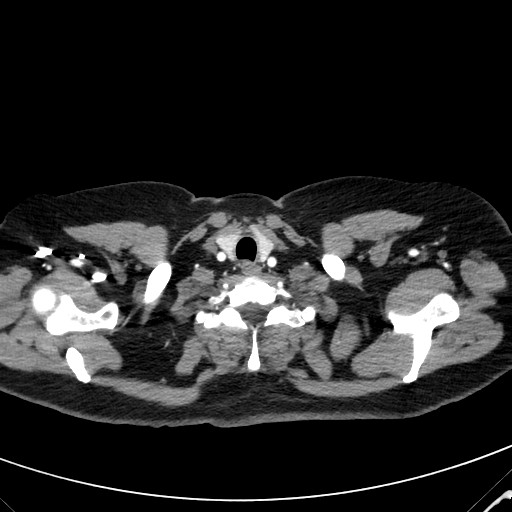

[cor · coronal · 0.68mm/px · 1 of 104 slices shown]
[im 52/104  mediastinal]
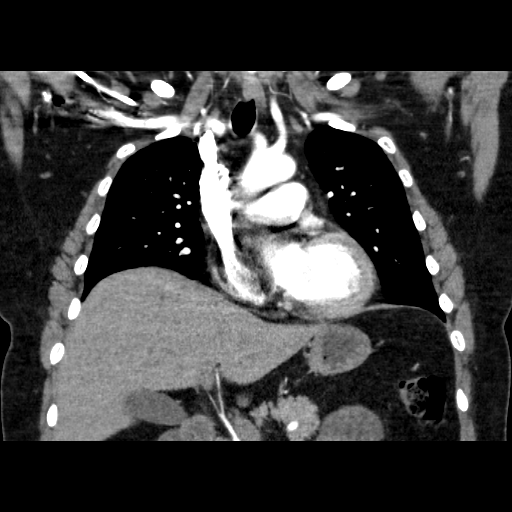

[19 of 36 positions shown; findings below may reference images not displayed]

FINDINGS: No filling defects in the pulmonary arteries to suggest
pulmonary emboli. Heart is normal size. Aorta is normal caliber.
Lungs are clear.  No focal airspace opacities or suspicious
nodules.  No effusions. No mediastinal, hilar, or axillary
adenopathy.  Visualized thyroid and chest wall soft tissues
unremarkable. Imaging into the upper abdomen shows no acute
findings.  No acute bony abnormality.  Old healed right posterior
sixth rib fracture.

Review of the MIP images confirms the above findings.
IMPRESSION: No evidence of pulmonary embolus.

No acute findings.

## 2012-03-30 IMAGING — CR DG CHEST 2V
2 series · 2 of 2 positions shown · non-contrast
Comparison: Chest x-ray 12/02/2010.

CLINICAL DATA: Shortness of breath

CHEST - 2 VIEW

[view not recorded (1 of 2)]
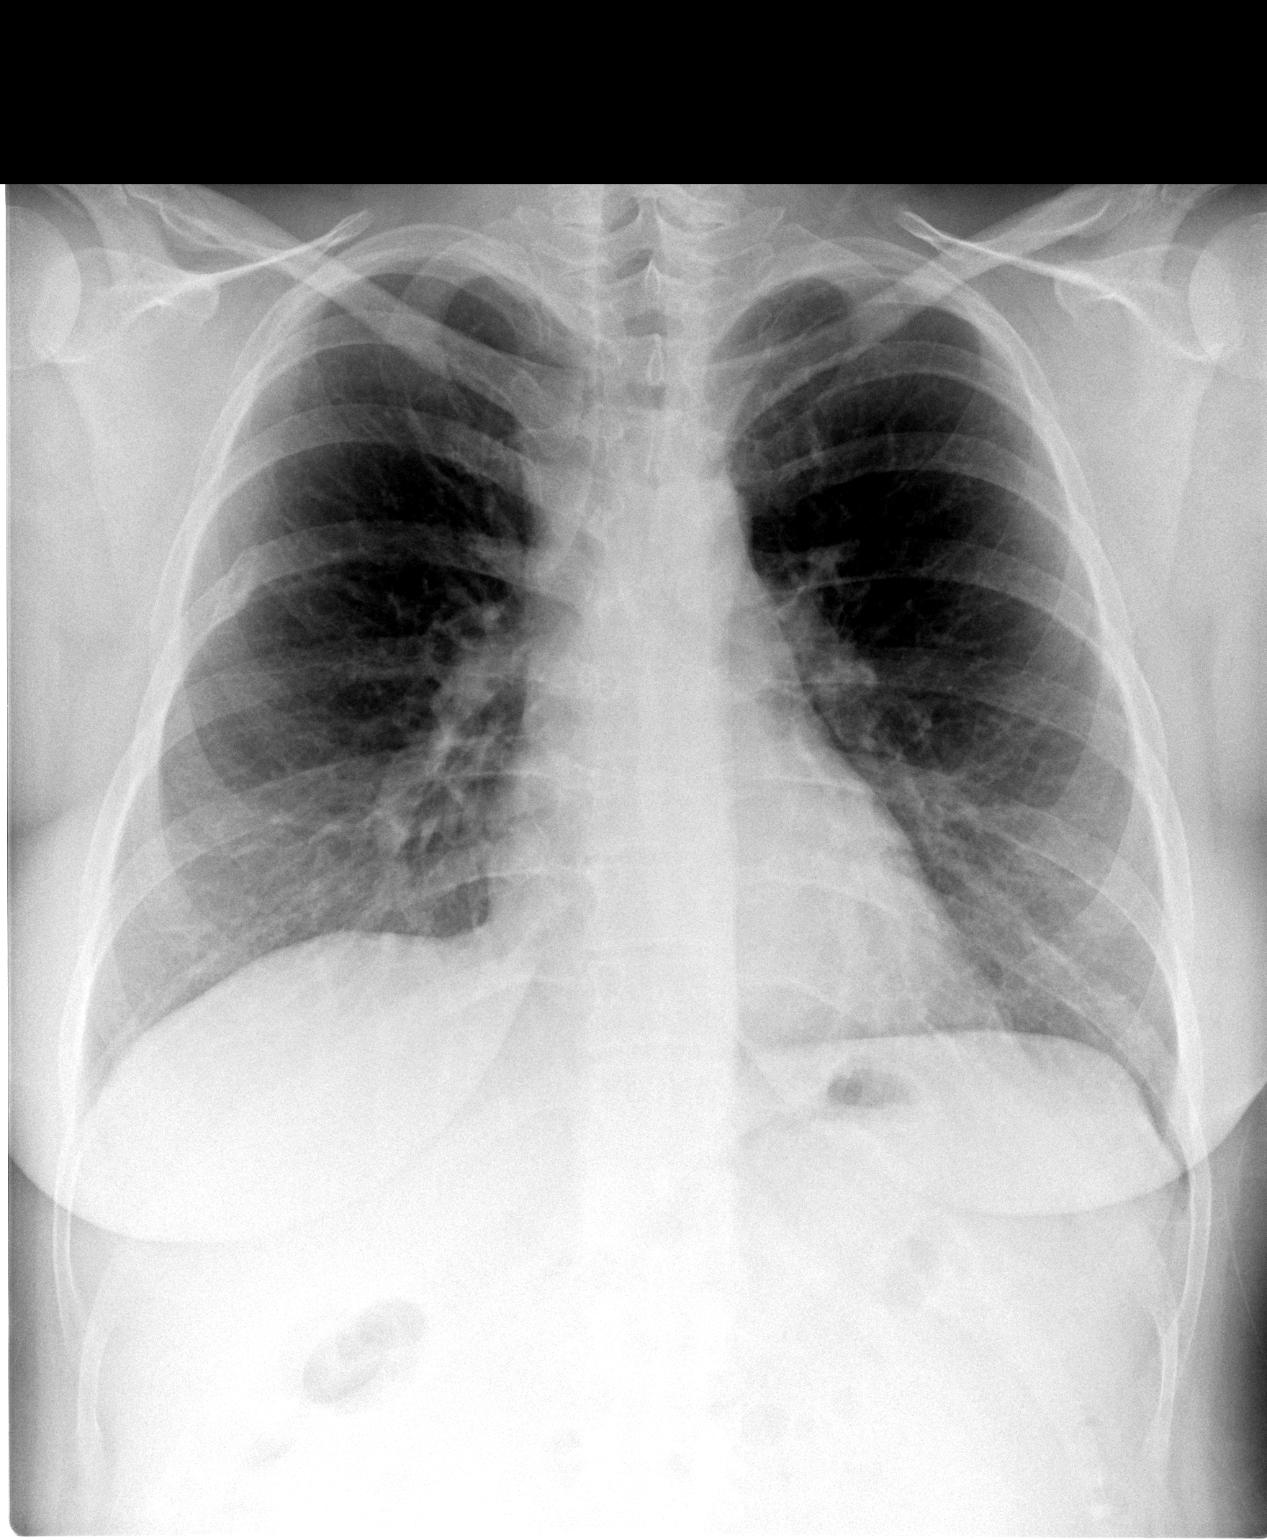

[view not recorded (2 of 2)]
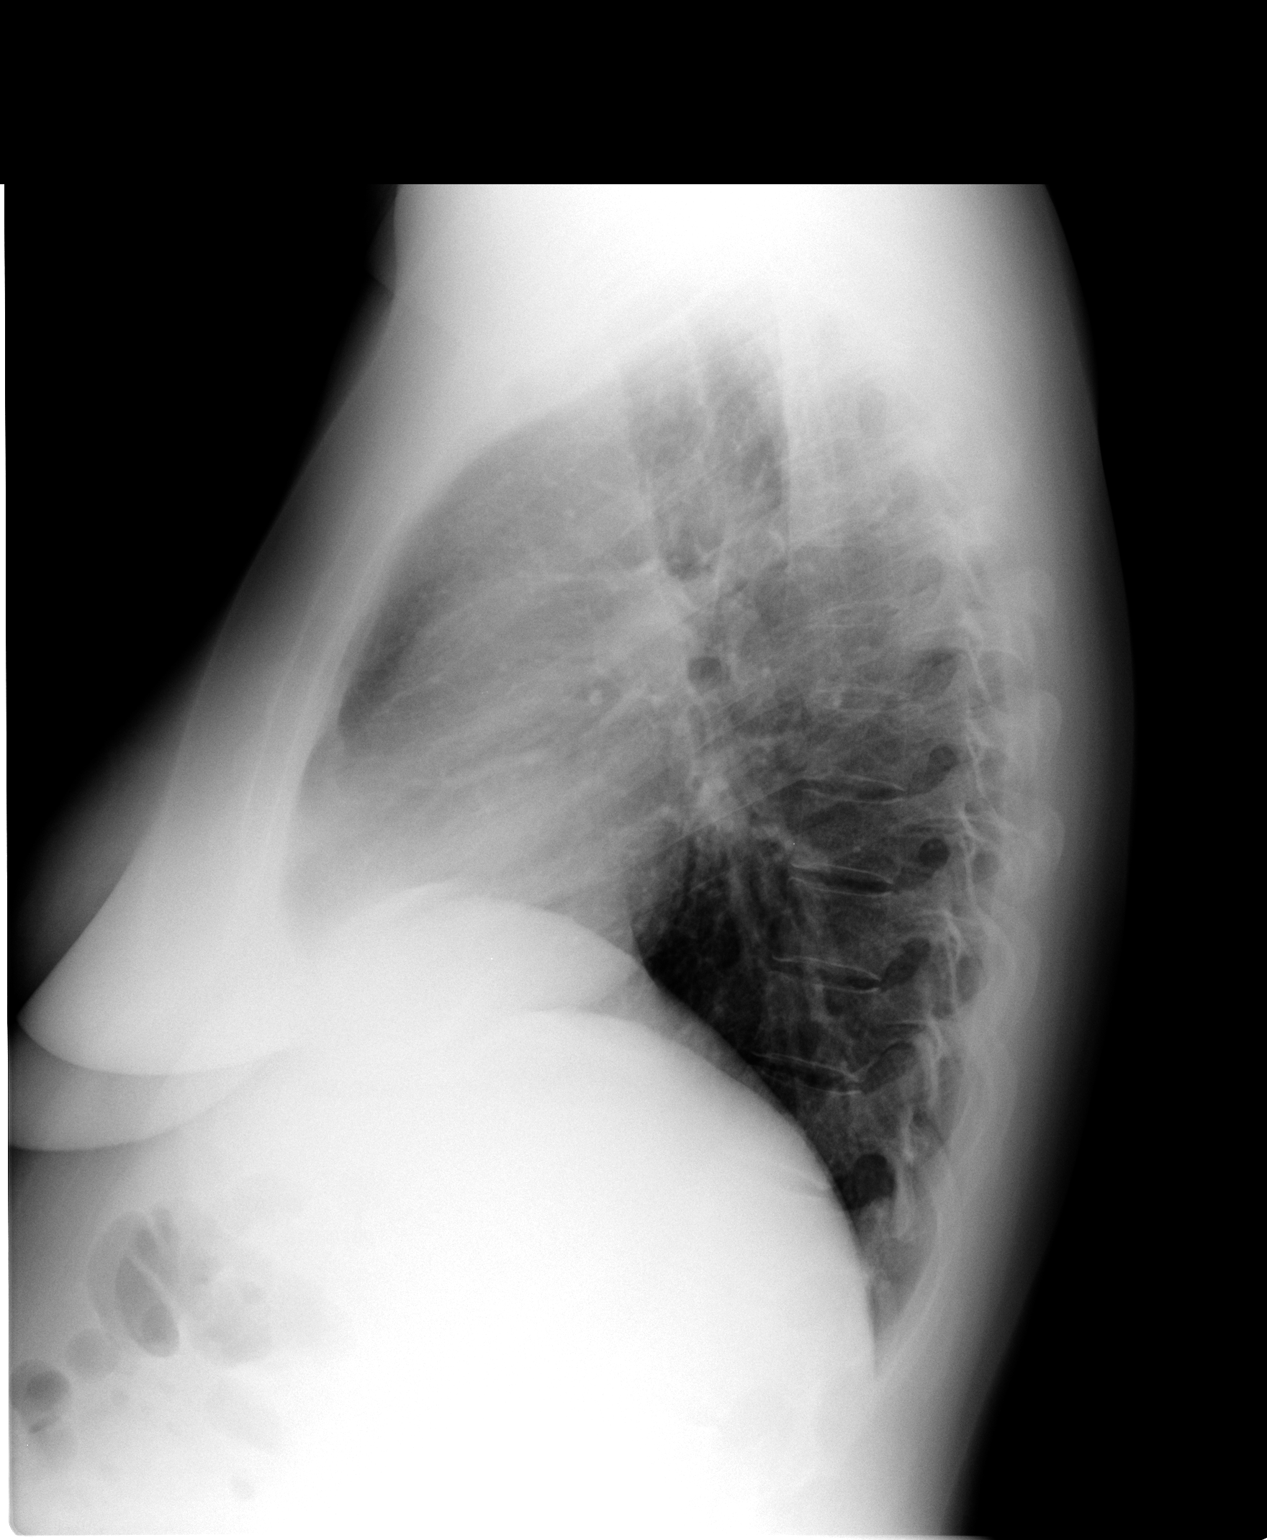

[2 of 2 positions shown; findings below may reference images not displayed]

FINDINGS: The cardiac silhouette, mediastinal and hilar contours
are within normal limits and stable.  There are bronchitic type
lung changes suggesting bronchitis or interstitial pneumonitis.  No
focal airspace consolidation to suggest pneumonia.  No pleural
effusions.  The bony thorax is stable.
IMPRESSION: Acute bronchitic type lung changes suggesting bronchitis or
interstitial pneumonitis.

## 2012-03-30 NOTE — ED Provider Notes (Signed)
Medical screening examination/treatment/procedure(s) were performed by non-physician practitioner and as supervising physician I was immediately available for consultation/collaboration.   Lyanne Co, MD 03/30/12 [phone]

## 2012-04-01 DIAGNOSIS — I10 Essential (primary) hypertension: Secondary | ICD-10-CM | POA: Diagnosis not present

## 2012-04-01 DIAGNOSIS — R42 Dizziness and giddiness: Secondary | ICD-10-CM | POA: Diagnosis not present

## 2012-04-01 DIAGNOSIS — E860 Dehydration: Secondary | ICD-10-CM | POA: Diagnosis not present

## 2012-04-01 DIAGNOSIS — R112 Nausea with vomiting, unspecified: Secondary | ICD-10-CM | POA: Diagnosis not present

## 2012-04-01 DIAGNOSIS — Z79899 Other long term (current) drug therapy: Secondary | ICD-10-CM | POA: Diagnosis not present

## 2012-04-01 DIAGNOSIS — Z888 Allergy status to other drugs, medicaments and biological substances status: Secondary | ICD-10-CM | POA: Diagnosis not present

## 2012-04-01 DIAGNOSIS — R109 Unspecified abdominal pain: Secondary | ICD-10-CM | POA: Diagnosis not present

## 2012-04-01 DIAGNOSIS — G894 Chronic pain syndrome: Secondary | ICD-10-CM | POA: Diagnosis not present

## 2012-04-01 DIAGNOSIS — R197 Diarrhea, unspecified: Secondary | ICD-10-CM | POA: Diagnosis not present

## 2012-04-02 ENCOUNTER — Emergency Department (HOSPITAL_BASED_OUTPATIENT_CLINIC_OR_DEPARTMENT_OTHER): Payer: Medicare Other

## 2012-04-02 ENCOUNTER — Telehealth: Payer: Self-pay | Admitting: Medical

## 2012-04-02 ENCOUNTER — Emergency Department (HOSPITAL_BASED_OUTPATIENT_CLINIC_OR_DEPARTMENT_OTHER)
Admission: EM | Admit: 2012-04-02 | Discharge: 2012-04-02 | Disposition: A | Discharge status: Home / Self Care | Payer: Medicare Other | Attending: Emergency Medicine | Admitting: Emergency Medicine

## 2012-04-02 ENCOUNTER — Encounter (HOSPITAL_BASED_OUTPATIENT_CLINIC_OR_DEPARTMENT_OTHER): Payer: Self-pay | Admitting: *Deleted

## 2012-04-02 DIAGNOSIS — F4310 Post-traumatic stress disorder, unspecified: Secondary | ICD-10-CM | POA: Diagnosis not present

## 2012-04-02 DIAGNOSIS — Z91010 Allergy to peanuts: Secondary | ICD-10-CM | POA: Insufficient documentation

## 2012-04-02 DIAGNOSIS — Z87891 Personal history of nicotine dependence: Secondary | ICD-10-CM | POA: Insufficient documentation

## 2012-04-02 DIAGNOSIS — M79609 Pain in unspecified limb: Secondary | ICD-10-CM | POA: Diagnosis not present

## 2012-04-02 DIAGNOSIS — R109 Unspecified abdominal pain: Secondary | ICD-10-CM | POA: Diagnosis not present

## 2012-04-02 DIAGNOSIS — G4730 Sleep apnea, unspecified: Secondary | ICD-10-CM | POA: Insufficient documentation

## 2012-04-02 DIAGNOSIS — R197 Diarrhea, unspecified: Secondary | ICD-10-CM | POA: Insufficient documentation

## 2012-04-02 DIAGNOSIS — I10 Essential (primary) hypertension: Secondary | ICD-10-CM | POA: Insufficient documentation

## 2012-04-02 DIAGNOSIS — S139XXA Sprain of joints and ligaments of unspecified parts of neck, initial encounter: Secondary | ICD-10-CM | POA: Diagnosis not present

## 2012-04-02 DIAGNOSIS — J45909 Unspecified asthma, uncomplicated: Secondary | ICD-10-CM | POA: Insufficient documentation

## 2012-04-02 DIAGNOSIS — K219 Gastro-esophageal reflux disease without esophagitis: Secondary | ICD-10-CM | POA: Diagnosis not present

## 2012-04-02 DIAGNOSIS — M542 Cervicalgia: Secondary | ICD-10-CM | POA: Diagnosis not present

## 2012-04-02 DIAGNOSIS — Z882 Allergy status to sulfonamides status: Secondary | ICD-10-CM | POA: Insufficient documentation

## 2012-04-02 DIAGNOSIS — E785 Hyperlipidemia, unspecified: Secondary | ICD-10-CM | POA: Insufficient documentation

## 2012-04-02 DIAGNOSIS — Z880 Allergy status to penicillin: Secondary | ICD-10-CM | POA: Insufficient documentation

## 2012-04-02 DIAGNOSIS — S6390XA Sprain of unspecified part of unspecified wrist and hand, initial encounter: Secondary | ICD-10-CM | POA: Diagnosis not present

## 2012-04-02 DIAGNOSIS — R112 Nausea with vomiting, unspecified: Secondary | ICD-10-CM | POA: Insufficient documentation

## 2012-04-02 LAB — URINALYSIS, ROUTINE W REFLEX MICROSCOPIC
Bilirubin Urine: NEGATIVE
Glucose, UA: NEGATIVE mg/dL
Hgb urine dipstick: NEGATIVE
Ketones, ur: NEGATIVE mg/dL
Leukocytes, UA: NEGATIVE
Nitrite: NEGATIVE
Protein, ur: NEGATIVE mg/dL
Specific Gravity, Urine: 1.026 (ref 1.005–1.030)
Urobilinogen, UA: 0.2 mg/dL (ref 0.0–1.0)
pH: 6 (ref 5.0–8.0)

## 2012-04-02 LAB — CBC WITH DIFFERENTIAL/PLATELET
Basophils Absolute: 0 10*3/uL (ref 0.0–0.1)
Basophils Relative: 0 % (ref 0–1)
Eosinophils Absolute: 0.2 10*3/uL (ref 0.0–0.7)
Eosinophils Relative: 2 % (ref 0–5)
HCT: 36.9 % (ref 36.0–46.0)
Hemoglobin: 12.5 g/dL (ref 12.0–15.0)
Lymphocytes Relative: 18 % (ref 12–46)
Lymphs Abs: 1.6 10*3/uL (ref 0.7–4.0)
MCH: 29.8 pg (ref 26.0–34.0)
MCHC: 33.9 g/dL (ref 30.0–36.0)
MCV: 87.9 fL (ref 78.0–100.0)
Monocytes Absolute: 0.7 10*3/uL (ref 0.1–1.0)
Monocytes Relative: 7 % (ref 3–12)
Neutro Abs: 6.6 10*3/uL (ref 1.7–7.7)
Neutrophils Relative %: 72 % (ref 43–77)
Platelets: 218 10*3/uL (ref 150–400)
RBC: 4.2 MIL/uL (ref 3.87–5.11)
RDW: 13.8 % (ref 11.5–15.5)
WBC: 9.1 10*3/uL (ref 4.0–10.5)

## 2012-04-02 LAB — COMPREHENSIVE METABOLIC PANEL
ALT: 10 U/L (ref 0–35)
AST: 10 U/L (ref 0–37)
Albumin: 3.6 g/dL (ref 3.5–5.2)
Alkaline Phosphatase: 62 U/L (ref 39–117)
BUN: 14 mg/dL (ref 6–23)
CO2: 23 mEq/L (ref 19–32)
Calcium: 9.3 mg/dL (ref 8.4–10.5)
Chloride: 104 mEq/L (ref 96–112)
Creatinine, Ser: 0.6 mg/dL (ref 0.50–1.10)
GFR calc Af Amer: >90 mL/min (ref >90)
GFR calc non Af Amer: >90 mL/min (ref >90)
Glucose, Bld: 119 mg/dL — ABNORMAL HIGH (ref 70–99)
Potassium: 3.8 mEq/L (ref 3.5–5.1)
Sodium: 138 mEq/L (ref 135–145)
Total Bilirubin: 0.1 mg/dL — ABNORMAL LOW (ref 0.3–1.2)
Total Protein: 6.9 g/dL (ref 6.0–8.3)

## 2012-04-02 LAB — LIPASE, BLOOD: Lipase: 48 U/L (ref 11–59)

## 2012-04-02 MED ORDER — ONDANSETRON 4 MG PO TBDP: 4.0000 mg | ORAL_TABLET | Freq: Three times a day (TID) | ORAL | Status: AC | PRN

## 2012-04-02 MED ORDER — SODIUM CHLORIDE 0.9 % IV SOLN
Freq: Once | INTRAVENOUS | Status: AC
  Administered 2012-04-02: 21:00:00 via INTRAVENOUS

## 2012-04-02 NOTE — ED Notes (Signed)
Seen by [facility] yesterday  For same and received IVF , but not feel better

## 2012-04-02 NOTE — ED Notes (Signed)
Pt c/o RUQ pain and SOB only at night

## 2012-04-02 NOTE — ED Notes (Signed)
Pt  sts last night was having problem breathing, but is c/o right quad pain.

## 2012-04-02 NOTE — Telephone Encounter (Signed)
Called patient and informed her that appointment for 04/03/12 was not needed and would be cancelled. Patient will be contacted by scheduler for ablation. Patient voiced concerns about scheduling due to husbands work schedule if she will require a driver. I informed patient that I will contact [place] and mention the patient's concerns so that she can contact her about scheduling the procedure. Patient voiced understanding and had no further questions or concerns.

## 2012-04-02 NOTE — Telephone Encounter (Signed)
Patient called stating that she has changed her mind and would like to have the novasure ablation instead of the IUD as discussed at her last appointment. The patient is scheduled for IUD insertion on 04/04/12.  Dr. Erin Fulling, please advise whether patient requires a surgical consult or if discussion of options at last visit was sufficient for surgery to be scheduled.

## 2012-04-02 NOTE — Telephone Encounter (Signed)
I sent [place] a note to schedule pt for ablation.  clh-S

## 2012-04-02 NOTE — ED Notes (Signed)
Pt also c/o diarrhea.
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
## 2012-04-03 ENCOUNTER — Encounter: Payer: Self-pay | Admitting: Physician Assistant

## 2012-04-03 ENCOUNTER — Ambulatory Visit: Payer: Medicare Other | Admitting: Obstetrics & Gynecology

## 2012-04-03 ENCOUNTER — Ambulatory Visit (INDEPENDENT_AMBULATORY_CARE_PROVIDER_SITE_OTHER): Payer: Medicare Other | Admitting: Physician Assistant

## 2012-04-03 VITALS — BP 141/90 | HR 92 | Temp 98.2°F | Ht 65.0 in | Wt 182.0 lb

## 2012-04-03 DIAGNOSIS — R61 Generalized hyperhidrosis: Secondary | ICD-10-CM

## 2012-04-03 DIAGNOSIS — R110 Nausea: Secondary | ICD-10-CM | POA: Diagnosis not present

## 2012-04-03 DIAGNOSIS — R141 Gas pain: Secondary | ICD-10-CM

## 2012-04-03 DIAGNOSIS — R51 Headache: Secondary | ICD-10-CM | POA: Diagnosis not present

## 2012-04-03 DIAGNOSIS — R5381 Other malaise: Secondary | ICD-10-CM | POA: Diagnosis not present

## 2012-04-03 DIAGNOSIS — R1084 Generalized abdominal pain: Secondary | ICD-10-CM

## 2012-04-03 DIAGNOSIS — R52 Pain, unspecified: Secondary | ICD-10-CM

## 2012-04-03 DIAGNOSIS — R140 Abdominal distension (gaseous): Secondary | ICD-10-CM

## 2012-04-03 DIAGNOSIS — IMO0001 Reserved for inherently not codable concepts without codable children: Secondary | ICD-10-CM

## 2012-04-03 DIAGNOSIS — R197 Diarrhea, unspecified: Secondary | ICD-10-CM

## 2012-04-03 DIAGNOSIS — R5383 Other fatigue: Secondary | ICD-10-CM | POA: Diagnosis not present

## 2012-04-03 DIAGNOSIS — E269 Hyperaldosteronism, unspecified: Secondary | ICD-10-CM | POA: Diagnosis not present

## 2012-04-03 MED ORDER — DICYCLOMINE HCL 10 MG/5ML PO SOLN: 20.0000 mg | Freq: Three times a day (TID) | ORAL | Status: DC

## 2012-04-03 NOTE — Progress Notes (Signed)
  Subjective:    Patient ID: Jennifer [name], female    DOB: January 25, 1966, 46 y.o.   MRN: [id number]  HPI Pt presents to clinic with abdominal pain and bloating, diarrhea, and episodes of sweating for 4 days. Pt reports that she usually has constipation issues so the is very different. She has a long hx of GI problems such as barretts esophagus, hital hernia, and GERD. She also is being seen by endocrinology and they suspect some type of hyperaldosteronism. She is very sensitive/allergic to many medications.  For 4 days she has had frequent 4-10 times a day loose stools. She denies any blood but has had mucus. She complains of abdominal pain and bloating that makes her feel SOB and sweaty. She denies any fever, chills. She went to ED yesterday. Xray of abdomen, UA, and bloodwork was all negative for any treatable cause. She was encouraged to use a BRAT diet and stay hydrated. She was also given Zofran for nausea. IN the past GI has given her Bentyl but she does not have any right not. She recently has had endoscopy and denies any worsening of acid reflux symptoms. She denies any recent abx in last 3 months, travel, or ingestion of any food that might be the culprit. She has not vomited but does feel nauseated.    Review of Systems     Objective:   Physical Exam  Constitutional: She is oriented to person, place, and time. She appears well-developed and well-nourished.  HENT:  Head: Normocephalic and atraumatic.  Cardiovascular: Normal rate, regular rhythm and normal heart sounds.   Pulmonary/Chest: Effort normal and breath sounds normal.       NO CVA tenderness.  Abdominal: Soft.       Mild distention over abdomen. Normal bowel sounds. Mild tenderness over lower abdominal area in both left and right quadrant. Neg Murphys sign.  Neurological: She is alert and oriented to person, place, and time.  Skin: Skin is warm and dry. She is not diaphoretic.  Psychiatric:       Cried multiple times  throughout the encounter. Repeated ideas over and over.           Assessment & Plan:  Diarrhea/Abdominal pain/sweating/bloating- Gave bentyl to use for abdominal pain/cramps. Told pt to increase pepcid to twice a day to see if that helps symptoms. Order stool cultures, O and P , and clostridium difficle. I encouraged the patient to wait another 24 hours and if still having diarrhea to return stool sample but if she did not want to wait she could return sample at any time. H.O given on brat diet. Encouraged patient to stay hydrated. Encouraged follow up with GI Dr.Rowten. She was unaware of when her last colonoscopy was. Looking at hx she has an extensive hx of radiation.

## 2012-04-03 NOTE — Patient Instructions (Addendum)
Take pepcid twice a day. STay hydrated.  Did give Bentyl.   Follow up with GI doctor. May need consider colonoscopy.   Viral Gastroenteritis Viral gastroenteritis is also known as stomach flu. This condition affects the stomach and intestinal tract. It can cause sudden diarrhea and vomiting. The illness typically lasts 3 to 8 days. Most people develop an immune response that eventually gets rid of the virus. While this natural response develops, the virus can make you quite ill. CAUSES  Many different viruses can cause gastroenteritis, such as rotavirus or noroviruses. You can catch one of these viruses by consuming contaminated food or water. You may also catch a virus by sharing utensils or other personal items with an infected person or by touching a contaminated surface. SYMPTOMS  The most common symptoms are diarrhea and vomiting. These problems can cause a severe loss of body fluids (dehydration) and a body salt (electrolyte) imbalance. Other symptoms may include:  Fever.   Headache.   Fatigue.   Abdominal pain.  DIAGNOSIS  Your caregiver can usually diagnose viral gastroenteritis based on your symptoms and a physical exam. A stool sample may also be taken to test for the presence of viruses or other infections. TREATMENT  This illness typically goes away on its own. Treatments are aimed at rehydration. The most serious cases of viral gastroenteritis involve vomiting so severely that you are not able to keep fluids down. In these cases, fluids must be given through an intravenous line (IV). HOME CARE INSTRUCTIONS   Drink enough fluids to keep your urine clear or pale yellow. Drink small amounts of fluids frequently and increase the amounts as tolerated.   Ask your caregiver for specific rehydration instructions.   Avoid:   Foods high in sugar.   Alcohol.   Carbonated drinks.   Tobacco.   Juice.   Caffeine drinks.   Extremely hot or cold fluids.   Fatty, greasy  foods.   Too much intake of anything at one time.   Dairy products until 24 to 48 hours after diarrhea stops.   You may consume probiotics. Probiotics are active cultures of beneficial bacteria. They may lessen the amount and number of diarrheal stools in adults. Probiotics can be found in yogurt with active cultures and in supplements.   Wash your hands well to avoid spreading the virus.   Only take over-the-counter or prescription medicines for pain, discomfort, or fever as directed by your caregiver. Do not give aspirin to children. Antidiarrheal medicines are not recommended.   Ask your caregiver if you should continue to take your regular prescribed and over-the-counter medicines.   Keep all follow-up appointments as directed by your caregiver.  SEEK IMMEDIATE MEDICAL CARE IF:   You are unable to keep fluids down.   You do not urinate at least once every 6 to 8 hours.   You develop shortness of breath.   You notice blood in your stool or vomit. This may look like coffee grounds.   You have abdominal pain that increases or is concentrated in one small area (localized).   You have persistent vomiting or diarrhea.   You have a fever.   The patient is a child younger than 3 months, and he or she has a fever.   The patient is a child older than 3 months, and he or she has a fever and persistent symptoms.   The patient is a child older than 3 months, and he or she has a fever and symptoms  suddenly get worse.   The patient is a baby, and he or she has no tears when crying.  MAKE SURE YOU:   Understand these instructions.   Will watch your condition.   Will get help right away if you are not doing well or get worse.  Document Released: 07/31/2005 Document Revised: 07/20/2011 Document Reviewed: 05/17/2011 [facility] Patient Information 2012 [place], [place].   B.R.A.T. Diet Your doctor has recommended the B.R.A.T. diet for you or your child until the condition improves.  This is often used to help control diarrhea and vomiting symptoms. If you or your child can tolerate clear liquids, you may have:  Bananas.   Rice.   Applesauce.   Toast (and other simple starches such as crackers, potatoes, noodles).  Be sure to avoid dairy products, meats, and fatty foods until symptoms are better. Fruit juices such as apple, grape, and prune juice can make diarrhea worse. Avoid these. Continue this diet for 2 days or as instructed by your caregiver. Document Released: 07/31/2005 Document Revised: 07/20/2011 Document Reviewed: 01/17/2007 [facility] Patient Information 2012 [place], [place].

## 2012-04-03 NOTE — ED Provider Notes (Signed)
Medical screening examination/treatment/procedure(s) were performed by non-physician practitioner and as supervising physician I was immediately available for consultation/collaboration.   Gwyneth Sprout, MD 04/03/12 2240

## 2012-04-05 DIAGNOSIS — H532 Diplopia: Secondary | ICD-10-CM | POA: Diagnosis not present

## 2012-04-05 DIAGNOSIS — H04129 Dry eye syndrome of unspecified lacrimal gland: Secondary | ICD-10-CM | POA: Diagnosis not present

## 2012-04-05 LAB — OVA AND PARASITE EXAMINATION: OP: NONE SEEN

## 2012-04-07 LAB — STOOL CULTURE

## 2012-04-08 ENCOUNTER — Telehealth: Payer: Self-pay | Admitting: Physician Assistant

## 2012-04-08 NOTE — Telephone Encounter (Signed)
Documentation of a phone call with Rochele Pages PA-C with Dr. Loman Chroman office. She called to inform me of pt's extensive GI history and work ups. Her most recent work up was an endoscopy 1 month ago. She has had numerous colonoscopies. We discussed proper management of care right now to be stool cultures, restart Bentyl, increase pepcid and them have patient follow up with Dr. Loman Chroman.   Phone call was 04/03/12 at 3:30 PM  Pt was informed of management of care after talking with [facility].

## 2012-04-09 ENCOUNTER — Ambulatory Visit (INDEPENDENT_AMBULATORY_CARE_PROVIDER_SITE_OTHER): Payer: Medicare Other | Admitting: Adult Health

## 2012-04-09 ENCOUNTER — Encounter: Payer: Self-pay | Admitting: Adult Health

## 2012-04-09 ENCOUNTER — Ambulatory Visit: Payer: Medicare Other | Admitting: Adult Health

## 2012-04-09 ENCOUNTER — Encounter: Payer: Self-pay | Admitting: Family Medicine

## 2012-04-09 ENCOUNTER — Ambulatory Visit: Payer: Medicare Other | Admitting: Physical Therapy

## 2012-04-09 ENCOUNTER — Ambulatory Visit: Payer: Medicare Other | Admitting: Pulmonary Disease

## 2012-04-09 VITALS — BP 122/84 | HR 114 | Temp 98.4°F | Ht 62.0 in | Wt 181.0 lb

## 2012-04-09 DIAGNOSIS — J383 Other diseases of vocal cords: Secondary | ICD-10-CM | POA: Diagnosis not present

## 2012-04-09 DIAGNOSIS — F339 Major depressive disorder, recurrent, unspecified: Secondary | ICD-10-CM | POA: Diagnosis not present

## 2012-04-09 MED ORDER — BENZONATATE 200 MG PO CAPS: 200.0000 mg | ORAL_CAPSULE | Freq: Three times a day (TID) | ORAL | Status: AC | PRN

## 2012-04-09 MED ORDER — RANITIDINE HCL 150 MG PO TABS: 150.0000 mg | ORAL_TABLET | Freq: Every day | ORAL | Status: DC

## 2012-04-09 NOTE — Assessment & Plan Note (Signed)
Flare with upper airway cough syndrome Refuses chlor trimeton.   Plan Restart Prevacid 30mg  daily  Restart Zantac 150mg  At bedtime   GERD diet Restart Saline nasal rinses As needed   Delsym 2 tsp Twice daily cough Tessalon Three times a day  As needed   Try to stop your cough, use frequent sips of water. Avoid mints.  Nasonex 2 puffs Twice daily   Begin Claritin 5mg  daily  follow up Dr. Delford Field  4-6 weeks in [facility]   Please contact office for sooner follow up if symptoms do not improve or worsen or seek emergency care
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
## 2012-04-09 NOTE — Patient Instructions (Addendum)
Restart Prevacid 30mg  daily  Restart Zantac 150mg  At bedtime   GERD diet Restart Saline nasal rinses As needed   Delsym 2 tsp Twice daily cough Tessalon Three times a day  As needed   Try to stop your cough, use frequent sips of water. Avoid mints.  Nasonex 2 puffs Twice daily   Begin Claritin 5mg  daily  follow up Dr. Delford Field  4-6 weeks in [facility]   Please contact office for sooner follow up if symptoms do not improve or worsen or seek emergency care

## 2012-04-10 DIAGNOSIS — R5383 Other fatigue: Secondary | ICD-10-CM | POA: Diagnosis not present

## 2012-04-10 DIAGNOSIS — J45909 Unspecified asthma, uncomplicated: Secondary | ICD-10-CM | POA: Diagnosis not present

## 2012-04-10 DIAGNOSIS — R1310 Dysphagia, unspecified: Secondary | ICD-10-CM | POA: Diagnosis not present

## 2012-04-10 DIAGNOSIS — R059 Cough, unspecified: Secondary | ICD-10-CM | POA: Diagnosis not present

## 2012-04-10 DIAGNOSIS — Z882 Allergy status to sulfonamides status: Secondary | ICD-10-CM | POA: Diagnosis not present

## 2012-04-10 DIAGNOSIS — F450 Somatization disorder: Secondary | ICD-10-CM | POA: Diagnosis not present

## 2012-04-10 DIAGNOSIS — J069 Acute upper respiratory infection, unspecified: Secondary | ICD-10-CM | POA: Diagnosis not present

## 2012-04-10 DIAGNOSIS — Z880 Allergy status to penicillin: Secondary | ICD-10-CM | POA: Diagnosis not present

## 2012-04-10 DIAGNOSIS — Z888 Allergy status to other drugs, medicaments and biological substances status: Secondary | ICD-10-CM | POA: Diagnosis not present

## 2012-04-10 DIAGNOSIS — Z79899 Other long term (current) drug therapy: Secondary | ICD-10-CM | POA: Diagnosis not present

## 2012-04-10 DIAGNOSIS — R5381 Other malaise: Secondary | ICD-10-CM | POA: Diagnosis not present

## 2012-04-10 DIAGNOSIS — I10 Essential (primary) hypertension: Secondary | ICD-10-CM | POA: Diagnosis not present

## 2012-04-10 DIAGNOSIS — R51 Headache: Secondary | ICD-10-CM | POA: Diagnosis not present

## 2012-04-10 DIAGNOSIS — R05 Cough: Secondary | ICD-10-CM | POA: Diagnosis not present

## 2012-04-10 DIAGNOSIS — R61 Generalized hyperhidrosis: Secondary | ICD-10-CM | POA: Diagnosis not present

## 2012-04-11 ENCOUNTER — Ambulatory Visit: Payer: Medicare Other | Admitting: Critical Care Medicine

## 2012-04-11 ENCOUNTER — Encounter (HOSPITAL_COMMUNITY): Payer: Self-pay

## 2012-04-11 ENCOUNTER — Ambulatory Visit (HOSPITAL_COMMUNITY)
Admission: RE | Admit: 2012-04-11 | Discharge: 2012-04-11 | Disposition: A | Discharge status: Home / Self Care | Payer: Medicare Other | Source: Ambulatory Visit | Attending: Emergency Medicine | Admitting: Emergency Medicine

## 2012-04-11 ENCOUNTER — Emergency Department (HOSPITAL_COMMUNITY)
Admission: EM | Admit: 2012-04-11 | Discharge: 2012-04-11 | Disposition: A | Discharge status: Home / Self Care | Payer: Medicare Other | Attending: Emergency Medicine | Admitting: Emergency Medicine

## 2012-04-11 DIAGNOSIS — R490 Dysphonia: Secondary | ICD-10-CM | POA: Diagnosis not present

## 2012-04-11 DIAGNOSIS — R1310 Dysphagia, unspecified: Secondary | ICD-10-CM

## 2012-04-11 DIAGNOSIS — F4310 Post-traumatic stress disorder, unspecified: Secondary | ICD-10-CM | POA: Insufficient documentation

## 2012-04-11 DIAGNOSIS — D649 Anemia, unspecified: Secondary | ICD-10-CM | POA: Diagnosis not present

## 2012-04-11 DIAGNOSIS — Z79899 Other long term (current) drug therapy: Secondary | ICD-10-CM | POA: Insufficient documentation

## 2012-04-11 DIAGNOSIS — I10 Essential (primary) hypertension: Secondary | ICD-10-CM | POA: Insufficient documentation

## 2012-04-11 DIAGNOSIS — R05 Cough: Secondary | ICD-10-CM | POA: Insufficient documentation

## 2012-04-11 DIAGNOSIS — G4730 Sleep apnea, unspecified: Secondary | ICD-10-CM | POA: Diagnosis not present

## 2012-04-11 DIAGNOSIS — K219 Gastro-esophageal reflux disease without esophagitis: Secondary | ICD-10-CM | POA: Insufficient documentation

## 2012-04-11 DIAGNOSIS — Z87891 Personal history of nicotine dependence: Secondary | ICD-10-CM | POA: Diagnosis not present

## 2012-04-11 DIAGNOSIS — R059 Cough, unspecified: Secondary | ICD-10-CM | POA: Insufficient documentation

## 2012-04-11 DIAGNOSIS — E785 Hyperlipidemia, unspecified: Secondary | ICD-10-CM | POA: Insufficient documentation

## 2012-04-11 MED ORDER — ONDANSETRON 4 MG PO TBDP: ORAL_TABLET | ORAL | Status: AC

## 2012-04-11 MED ORDER — GI COCKTAIL ~~LOC~~
30.0000 mL | Freq: Once | ORAL | Status: DC
  Filled 2012-04-11: qty 30

## 2012-04-11 MED ORDER — SODIUM CHLORIDE 0.9 % IV BOLUS (SEPSIS)
1000.0000 mL | Freq: Once | INTRAVENOUS | Status: AC
  Administered 2012-04-11: 1000 mL via INTRAVENOUS

## 2012-04-11 MED ORDER — ONDANSETRON HCL 4 MG/2ML IJ SOLN
4.0000 mg | Freq: Once | INTRAMUSCULAR | Status: AC
  Administered 2012-04-11: 4 mg via INTRAVENOUS
  Filled 2012-04-11: qty 2

## 2012-04-11 MED ORDER — FAMOTIDINE IN NACL 20-0.9 MG/50ML-% IV SOLN
20.0000 mg | Freq: Once | INTRAVENOUS | Status: AC
  Administered 2012-04-11: 20 mg via INTRAVENOUS
  Filled 2012-04-11: qty 50
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
## 2012-04-11 NOTE — ED Notes (Signed)
Patient reports that she has coughing, vomiting, sore throat, sinus congestion, and N/V. Patient state she has seen a pulmonologist, ENT, and endo in the past 2 weeks. Patient states they told her she was producing too much mucous. Patient was also given a cough suppressant by the pulmonologist.  Patient states she has been having vomiting and sore throat. Patient very tearful about situation. Patient states she feels as if she is "clamping down " in her bronchial area. Lungs clear bilaterally.

## 2012-04-12 NOTE — Progress Notes (Signed)
Speech Language Pathology Patient Details  Name: Jennifer [name] MRN: [id number] Date of Birth: 10-29-1965  Today's Date: 04/12/2012  Clarification: MBS complete on 04/11/12 completed as OP study by Jennifer [name], SLP, not as part of ED visit.   Jennifer Kuznicki MA, CCC-SLP [phone]   Jennifer [name] 04/12/2012,8:57 AM

## 2012-04-13 DIAGNOSIS — J069 Acute upper respiratory infection, unspecified: Secondary | ICD-10-CM | POA: Diagnosis not present

## 2012-04-13 DIAGNOSIS — IMO0002 Reserved for concepts with insufficient information to code with codable children: Secondary | ICD-10-CM | POA: Diagnosis not present

## 2012-04-13 DIAGNOSIS — M542 Cervicalgia: Secondary | ICD-10-CM | POA: Diagnosis not present

## 2012-04-13 DIAGNOSIS — J40 Bronchitis, not specified as acute or chronic: Secondary | ICD-10-CM | POA: Diagnosis not present

## 2012-04-13 DIAGNOSIS — J209 Acute bronchitis, unspecified: Secondary | ICD-10-CM | POA: Diagnosis not present

## 2012-04-13 DIAGNOSIS — Z79899 Other long term (current) drug therapy: Secondary | ICD-10-CM | POA: Diagnosis not present

## 2012-04-13 DIAGNOSIS — R05 Cough: Secondary | ICD-10-CM | POA: Diagnosis not present

## 2012-04-13 DIAGNOSIS — R059 Cough, unspecified: Secondary | ICD-10-CM | POA: Diagnosis not present

## 2012-04-13 DIAGNOSIS — F411 Generalized anxiety disorder: Secondary | ICD-10-CM | POA: Diagnosis not present

## 2012-04-13 DIAGNOSIS — Z8719 Personal history of other diseases of the digestive system: Secondary | ICD-10-CM | POA: Diagnosis not present

## 2012-04-13 DIAGNOSIS — R0602 Shortness of breath: Secondary | ICD-10-CM | POA: Diagnosis not present

## 2012-04-13 NOTE — ED Provider Notes (Signed)
  I performed a history and physical examination of Jennifer [name] and discussed her management with Dr. Loura Pardon.  I agree with the history, physical, assessment, and plan of care, with the following exceptions: None  This patient with multiple medical problems, and in numerable recent visits, including several times within the past week all 4/complaints presents with concerns of ongoing dysphasia, cough.  Notably, the patient has been seen by her pulmonologist, her ENT, her GI doctor, her primary care doctor all within the past weeks, and has had multiple studies, including endoscopy, pulmonary function tests, CAT scans all without definitive diagnosis for her chronic issues.  Today the patient is in no distress, but is tearful, asking for an answer for her condition.  We discussed her condition with multiple gastroenterologists, range 4 expedited outpatient swallow study here with results to her GI doctor.  The patient was discharged to our facility for that procedure.  Absent notable lab findings, abnormal vital signs, she is appropriate for continued outpatient as an outpatient.  Elyse Jarvis, MD 04/13/12 Jennifer [name]

## 2012-04-16 ENCOUNTER — Emergency Department (HOSPITAL_BASED_OUTPATIENT_CLINIC_OR_DEPARTMENT_OTHER)
Admission: EM | Admit: 2012-04-16 | Discharge: 2012-04-17 | Disposition: A | Discharge status: Home / Self Care | Payer: Medicare Other | Attending: Emergency Medicine | Admitting: Emergency Medicine

## 2012-04-16 ENCOUNTER — Encounter (HOSPITAL_BASED_OUTPATIENT_CLINIC_OR_DEPARTMENT_OTHER): Payer: Self-pay | Admitting: *Deleted

## 2012-04-16 DIAGNOSIS — Z87891 Personal history of nicotine dependence: Secondary | ICD-10-CM | POA: Insufficient documentation

## 2012-04-16 DIAGNOSIS — E785 Hyperlipidemia, unspecified: Secondary | ICD-10-CM | POA: Insufficient documentation

## 2012-04-16 DIAGNOSIS — M889 Osteitis deformans of unspecified bone: Secondary | ICD-10-CM | POA: Diagnosis not present

## 2012-04-16 DIAGNOSIS — I10 Essential (primary) hypertension: Secondary | ICD-10-CM | POA: Insufficient documentation

## 2012-04-16 DIAGNOSIS — R059 Cough, unspecified: Secondary | ICD-10-CM | POA: Diagnosis not present

## 2012-04-16 DIAGNOSIS — R0602 Shortness of breath: Secondary | ICD-10-CM | POA: Diagnosis not present

## 2012-04-16 DIAGNOSIS — F4310 Post-traumatic stress disorder, unspecified: Secondary | ICD-10-CM | POA: Insufficient documentation

## 2012-04-16 DIAGNOSIS — K219 Gastro-esophageal reflux disease without esophagitis: Secondary | ICD-10-CM | POA: Insufficient documentation

## 2012-04-16 DIAGNOSIS — R05 Cough: Secondary | ICD-10-CM | POA: Insufficient documentation

## 2012-04-16 DIAGNOSIS — F411 Generalized anxiety disorder: Secondary | ICD-10-CM | POA: Diagnosis not present

## 2012-04-16 DIAGNOSIS — D649 Anemia, unspecified: Secondary | ICD-10-CM | POA: Insufficient documentation

## 2012-04-16 DIAGNOSIS — G4730 Sleep apnea, unspecified: Secondary | ICD-10-CM | POA: Insufficient documentation

## 2012-04-16 DIAGNOSIS — J069 Acute upper respiratory infection, unspecified: Secondary | ICD-10-CM | POA: Diagnosis not present

## 2012-04-16 DIAGNOSIS — J45909 Unspecified asthma, uncomplicated: Secondary | ICD-10-CM | POA: Diagnosis not present

## 2012-04-16 NOTE — ED Notes (Signed)
Cough. Feels sob. Evaluated by Respiratory therapist. Lungs clear. No respiratory distress.
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
## 2012-04-17 ENCOUNTER — Telehealth: Payer: Self-pay | Admitting: Critical Care Medicine

## 2012-04-17 DIAGNOSIS — F4310 Post-traumatic stress disorder, unspecified: Secondary | ICD-10-CM | POA: Diagnosis not present

## 2012-04-17 DIAGNOSIS — K219 Gastro-esophageal reflux disease without esophagitis: Secondary | ICD-10-CM | POA: Diagnosis not present

## 2012-04-17 DIAGNOSIS — D649 Anemia, unspecified: Secondary | ICD-10-CM | POA: Diagnosis not present

## 2012-04-17 DIAGNOSIS — E785 Hyperlipidemia, unspecified: Secondary | ICD-10-CM | POA: Diagnosis not present

## 2012-04-17 DIAGNOSIS — G4730 Sleep apnea, unspecified: Secondary | ICD-10-CM | POA: Diagnosis not present

## 2012-04-17 DIAGNOSIS — I10 Essential (primary) hypertension: Secondary | ICD-10-CM | POA: Diagnosis not present

## 2012-04-17 DIAGNOSIS — Z87891 Personal history of nicotine dependence: Secondary | ICD-10-CM | POA: Diagnosis not present

## 2012-04-17 DIAGNOSIS — R059 Cough, unspecified: Secondary | ICD-10-CM | POA: Diagnosis not present

## 2012-04-17 MED ORDER — BENZONATATE 100 MG PO CAPS: 100.0000 mg | ORAL_CAPSULE | Freq: Three times a day (TID) | ORAL | Status: AC

## 2012-04-17 NOTE — Telephone Encounter (Signed)
Just double book tomorrow

## 2012-04-17 NOTE — Telephone Encounter (Signed)
lmomtcb  

## 2012-04-17 NOTE — Telephone Encounter (Signed)
Called, spoke with pt.  C/o coughing spells, feeling like her "upper airway" is going to close down, feels like she can't breath at times, and having upper back pain.  Cough was prod with white/green mucus.  Mucus is now yellowish green.  States "I haven't been this sick for a long time since when I was a smoker."  She was seen by TP on 8/27 and was in the ER on 8/29 and 9/3.  Also, reports she was seen by her asthma dr last week in [place] but "forgot his name."  States he gave her steroids and wanted to switch kelflex to ceftin.  Pt does not think she can take ceftin and "took most" of the keflex.  Concerned d/t symptoms -- states she is being told that her lungs sound normal and could be anxiety/panic.  Reports she's never had anxiety/panic feel like this and symptoms feel like they are upper airway not lower and aren't getting any better.  Also, would like to discuss not sleeping well qhs and snoring more.  States she was diagnosed with mild sleep apnea in the past and dr wanted her to try cpap a few times.  She doesn't think she has a sleep dr and could not remember who wanted to place her on cpap but "Dr. Delford Field should have all of these rescords."  She is requesting OV with PW tomorrow in [place] -- states she cannot wait another month (has appt scheduled for 10/3) to see him and does not have gas money to come to [place] office at this time.  Dr. Delford Field, pls advise.  Thank you.

## 2012-04-18 ENCOUNTER — Encounter: Payer: Self-pay | Admitting: Family Medicine

## 2012-04-18 ENCOUNTER — Ambulatory Visit: Payer: Medicare Other | Admitting: Critical Care Medicine

## 2012-04-18 ENCOUNTER — Ambulatory Visit (INDEPENDENT_AMBULATORY_CARE_PROVIDER_SITE_OTHER): Payer: Medicare Other | Admitting: Family Medicine

## 2012-04-18 VITALS — BP 138/100 | HR 104 | Temp 98.1°F | Wt 180.0 lb

## 2012-04-18 DIAGNOSIS — J329 Chronic sinusitis, unspecified: Secondary | ICD-10-CM

## 2012-04-18 DIAGNOSIS — R05 Cough: Secondary | ICD-10-CM | POA: Diagnosis not present

## 2012-04-18 DIAGNOSIS — R059 Cough, unspecified: Secondary | ICD-10-CM

## 2012-04-18 NOTE — Progress Notes (Signed)
Subjective:    Patient ID: Jennifer [name], female    DOB: Jan 12, 1966, 46 y.o.   MRN: [id number]  HPI Productive cough x 1 months. Went to ENT about 3 weeks ago and told it was normal but now has turned green and has become more thick. Now a yellow/green.  Has been taking claritin and mucinex.  Also started on doxy but upset her stomach. Went to Ed and they put her on keflex but wasn't able to complete that either. Has been using the nasal saline as well.  Says feels having SOB and feels can' move her air through her throat.  Has been using her inhalers as well.   2-3 days ago went to ED again and asked if could have a ABG.  Says feels a bubbling in her throat.  Says had blurry vision this AM.  She has had a neg d-dimer. She says she feels so SOB she didn't clean her house.  Says feels alittle dizzy. Left shoulder is hurting again.  She is worried that some of this may be her asthma though she feels like she can't breathe in her upper chest versus in her lower chest. The she does occasionally feel some discomfort when she tries to take a deep breath in her lower chest. No fevers. No ear pain. Some mild sore throat.    Review of Systems     Objective:   Physical Exam  Constitutional: She is oriented to person, place, and time. She appears well-developed and well-nourished.  HENT:  Head: Normocephalic and atraumatic.  Right Ear: External ear normal.  Left Ear: External ear normal.  Nose: Nose normal.  Mouth/Throat: Oropharynx is clear and moist.       TMs and canals are clear. She does have some sweling behind her right posterior tongue.    Eyes: Conjunctivae and EOM are normal. Pupils are equal, round, and reactive to light.  Neck: Neck supple. No thyromegaly present.  Cardiovascular: Normal rate, regular rhythm and normal heart sounds.   Pulmonary/Chest: Effort normal and breath sounds normal. She has no wheezes.  Lymphadenopathy:    She has no cervical adenopathy.  Neurological: She is  alert and oriented to person, place, and time.  Skin: Skin is warm and dry.  Psychiatric: She has a normal mood and affect.          Assessment & Plan:  Cough/sinusitis - her peak flow was 500 today which is in the green zone. This is not her asthma that's causing her shortness of breath. I really think it's mostly a sinus infection that is draining into her upper chest and throat. I discussed that at this point she is Re: using the Mucinex, nasal saline, and antihistamines it is not improving. Thus I think an antibiotic is the only thing that we'll get her significantly better at this point. She says she would be willing to restart the Keflex. She has so many intolerances to medications. I encouraged to make sure she takes it with food and water. She says she's not sure she can really take it 4 times a day but I encouraged her to try to get 3 times a day in. She wasn't short she can call the office back. In the meantime I would continue her antihistamine and allergy treatment. I also reviewed with her and that her peak flow meter with her since she has a better idea of when she needs to use her rescue inhaler. If she's in the yellow zone  I encouraged her to use 2 puffs weight 20 minutes and retest her peak flow to see if she is coming up. Then can repeat if needed. If she's in the red zone I want her to use 4 puffs on her Xopenex. Then repeat peak flow in 30 minutes. If she is unable to get her peak flow to improve after 2 treatments then I recommend that she does go to the emergency department or seek care here or at her urgent care.

## 2012-04-18 NOTE — Telephone Encounter (Signed)
lmomtcb x 2 this # is listed as mobile and home #

## 2012-04-18 NOTE — Patient Instructions (Addendum)
Restart your keflex.   Move appt with Dr. Delford Field to next week.

## 2012-04-18 NOTE — Telephone Encounter (Signed)
Pt then called back and states she saw her PCP today and she advised that she wait a week to see PW instead of seeing him today. Pt wants to r/s the appt to next week. The pt is set to see PW on Monday at 9am.Jennifer LeRoy, [facility]

## 2012-04-18 NOTE — Telephone Encounter (Signed)
Pt returned call. I advised per msg that she could be seen today in [place] office. She is scheduled for 1:30 today. Nothing further needed per pt at this time. Jennifer [name]

## 2012-04-19 ENCOUNTER — Emergency Department (HOSPITAL_BASED_OUTPATIENT_CLINIC_OR_DEPARTMENT_OTHER): Payer: Medicare Other

## 2012-04-19 ENCOUNTER — Emergency Department (HOSPITAL_BASED_OUTPATIENT_CLINIC_OR_DEPARTMENT_OTHER)
Admission: EM | Admit: 2012-04-19 | Discharge: 2012-04-19 | Disposition: A | Discharge status: Home / Self Care | Payer: Medicare Other | Attending: Emergency Medicine | Admitting: Emergency Medicine

## 2012-04-19 ENCOUNTER — Encounter (HOSPITAL_BASED_OUTPATIENT_CLINIC_OR_DEPARTMENT_OTHER): Payer: Self-pay | Admitting: *Deleted

## 2012-04-19 DIAGNOSIS — J45909 Unspecified asthma, uncomplicated: Secondary | ICD-10-CM | POA: Diagnosis not present

## 2012-04-19 DIAGNOSIS — R079 Chest pain, unspecified: Secondary | ICD-10-CM | POA: Diagnosis not present

## 2012-04-19 DIAGNOSIS — Z880 Allergy status to penicillin: Secondary | ICD-10-CM | POA: Insufficient documentation

## 2012-04-19 DIAGNOSIS — Z882 Allergy status to sulfonamides status: Secondary | ICD-10-CM | POA: Diagnosis not present

## 2012-04-19 DIAGNOSIS — K219 Gastro-esophageal reflux disease without esophagitis: Secondary | ICD-10-CM | POA: Diagnosis not present

## 2012-04-19 DIAGNOSIS — I10 Essential (primary) hypertension: Secondary | ICD-10-CM | POA: Insufficient documentation

## 2012-04-19 DIAGNOSIS — R0602 Shortness of breath: Secondary | ICD-10-CM

## 2012-04-19 DIAGNOSIS — E785 Hyperlipidemia, unspecified: Secondary | ICD-10-CM | POA: Diagnosis not present

## 2012-04-19 DIAGNOSIS — Z87891 Personal history of nicotine dependence: Secondary | ICD-10-CM | POA: Diagnosis not present

## 2012-04-19 DIAGNOSIS — F4310 Post-traumatic stress disorder, unspecified: Secondary | ICD-10-CM | POA: Insufficient documentation

## 2012-04-19 DIAGNOSIS — R05 Cough: Secondary | ICD-10-CM | POA: Insufficient documentation

## 2012-04-19 DIAGNOSIS — R059 Cough, unspecified: Secondary | ICD-10-CM

## 2012-04-19 LAB — POCT I-STAT 3, ART BLOOD GAS (G3+)
Acid-Base Excess: 2 mmol/L (ref 0.0–2.0)
Bicarbonate: 24.8 mEq/L — ABNORMAL HIGH (ref 20.0–24.0)
O2 Saturation: 68 %
TCO2: 26 mmol/L (ref 0–100)
pCO2 arterial: 32.6 mmHg — ABNORMAL LOW (ref 35.0–45.0)
pH, Arterial: 7.489 — ABNORMAL HIGH (ref 7.350–7.450)
pO2, Arterial: 32 mmHg — CL (ref 80.0–100.0)

## 2012-04-19 LAB — D-DIMER, QUANTITATIVE: D-Dimer, Quant: <0.22 ug/mL-FEU (ref 0.00–0.48)

## 2012-04-19 MED ORDER — ALBUTEROL SULFATE (5 MG/ML) 0.5% IN NEBU
5.0000 mg | INHALATION_SOLUTION | Freq: Once | RESPIRATORY_TRACT | Status: DC
  Filled 2012-04-19: qty 1
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
## 2012-04-19 NOTE — ED Notes (Signed)
P seen here 3 days ago for same cont to c/o SOB

## 2012-04-21 NOTE — ED Provider Notes (Signed)
Medical screening examination/treatment/procedure(s) were performed by non-physician practitioner and as supervising physician I was immediately available for consultation/collaboration.   Jennifer [name]. Oletta Lamas, MD 04/21/12 1610

## 2012-04-22 ENCOUNTER — Ambulatory Visit (INDEPENDENT_AMBULATORY_CARE_PROVIDER_SITE_OTHER): Payer: Medicare Other | Admitting: Critical Care Medicine

## 2012-04-22 ENCOUNTER — Encounter: Payer: Self-pay | Admitting: Critical Care Medicine

## 2012-04-22 VITALS — BP 150/86 | HR 116 | Temp 98.6°F | Ht 62.0 in | Wt 180.0 lb

## 2012-04-22 DIAGNOSIS — D649 Anemia, unspecified: Secondary | ICD-10-CM | POA: Diagnosis not present

## 2012-04-22 DIAGNOSIS — J383 Other diseases of vocal cords: Secondary | ICD-10-CM | POA: Diagnosis not present

## 2012-04-22 DIAGNOSIS — J328 Other chronic sinusitis: Secondary | ICD-10-CM

## 2012-04-22 IMAGING — CT CT HEAD W/O CM
1 series · 16 of 30 positions shown, 20 images · non-contrast
Comparison: None.

CLINICAL DATA: Severe headache for 3 days.

CT HEAD WITHOUT CONTRAST
TECHNIQUE: Contiguous axial images were obtained from the base of
the skull through the vertex without contrast

[Series 2: head 4.8 h37s · axial · 0.45mm/px · z∈[-117,+19]mm · 16 of 32 slices shown, 20 images]
[im 2/32  brain]
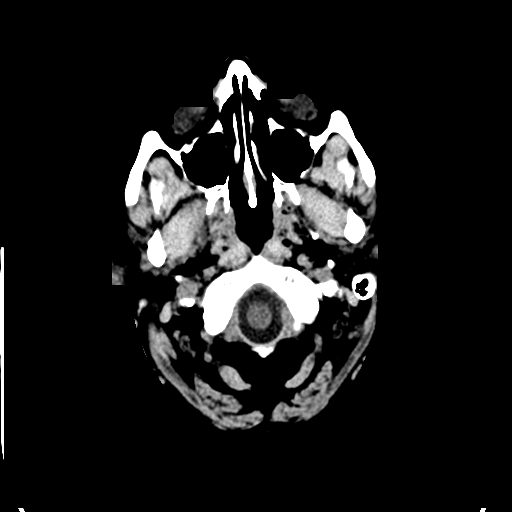
[im 2/32  bone]
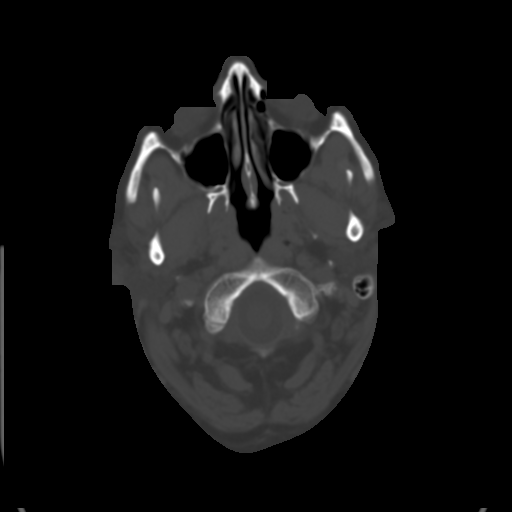
[im 4/32  brain]
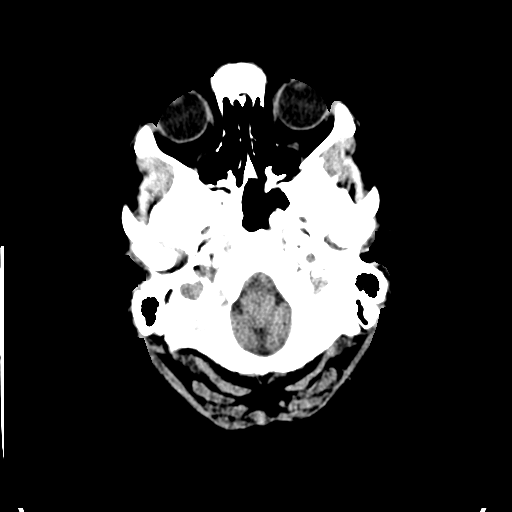
[im 6/32  brain]
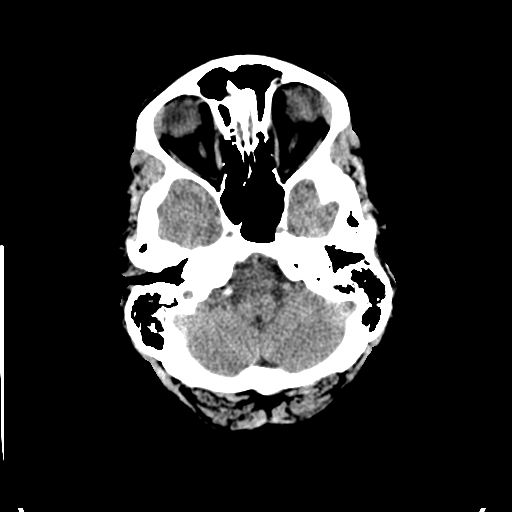
[im 8/32  brain]
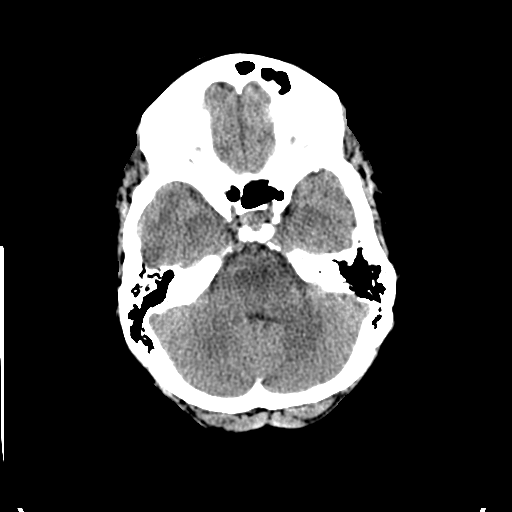
[im 9/32  brain]
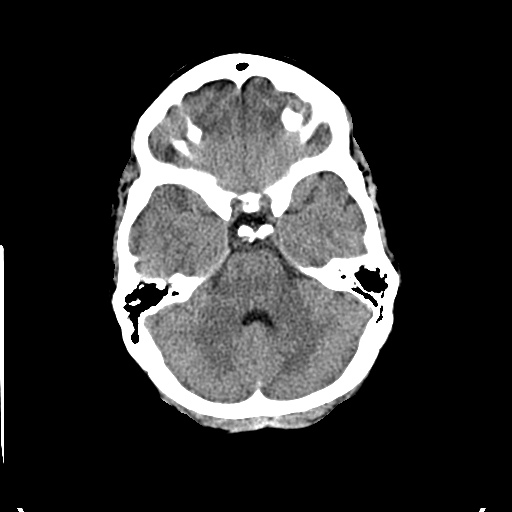
[im 9/32  bone]
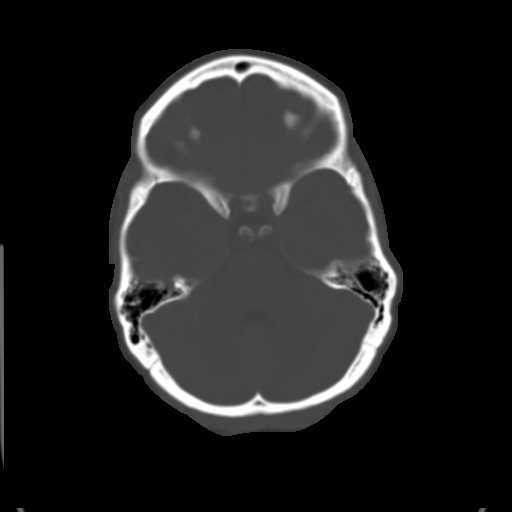
[im 11/32  brain]
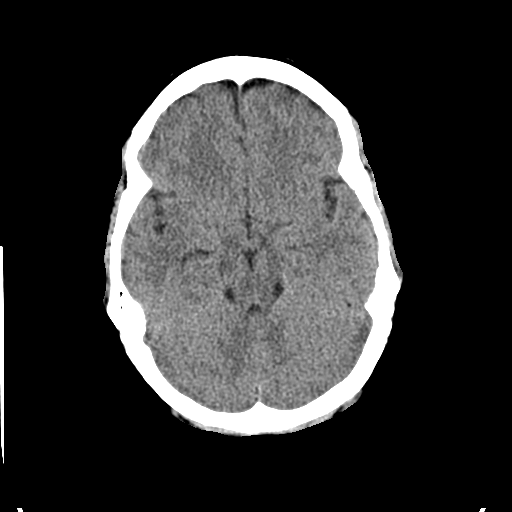
[im 13/32  brain]
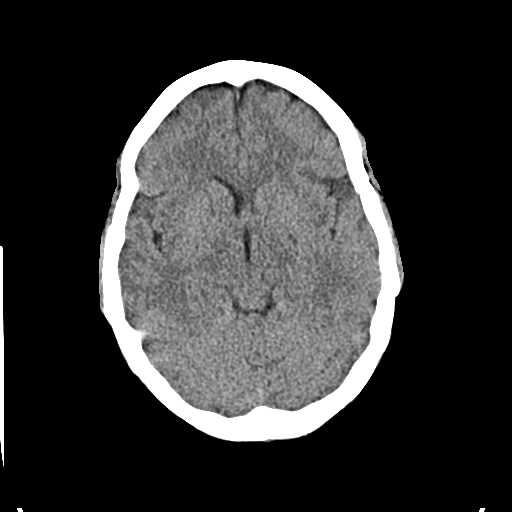
[im 15/32  brain]
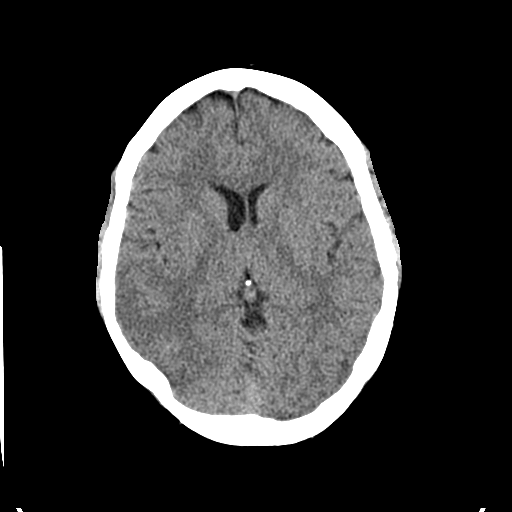
[im 17/32  brain]
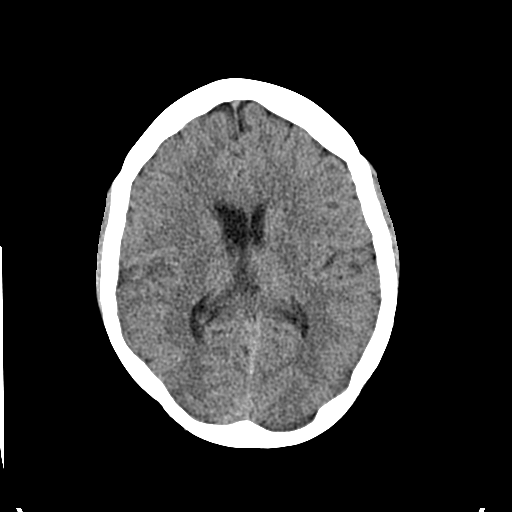
[im 17/32  bone]
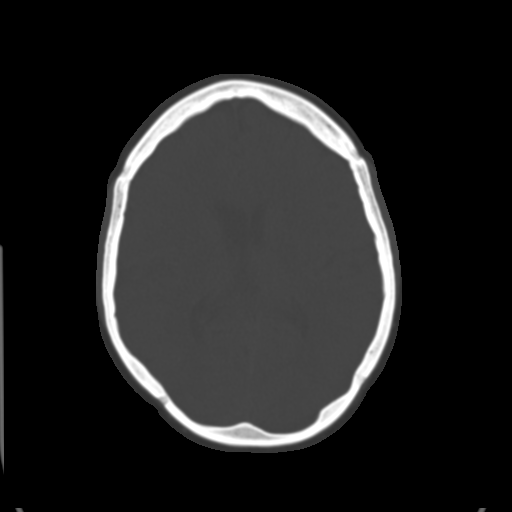
[im 19/32  brain]
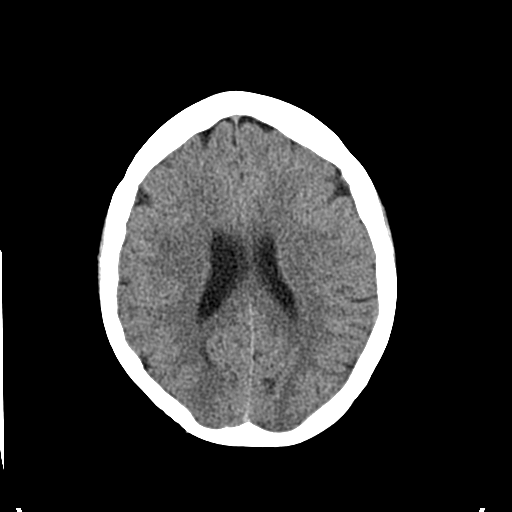
[im 21/32  brain]
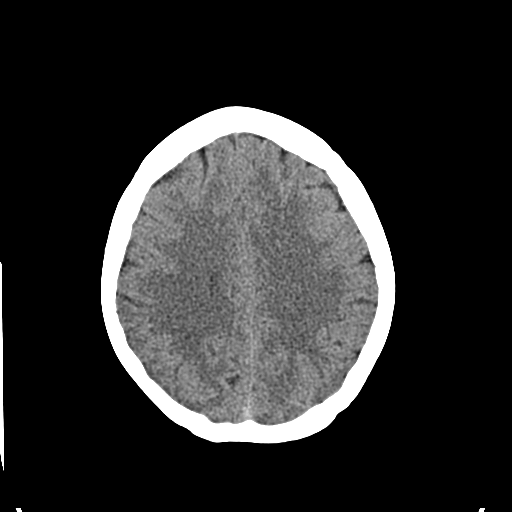
[im 23/32  brain]
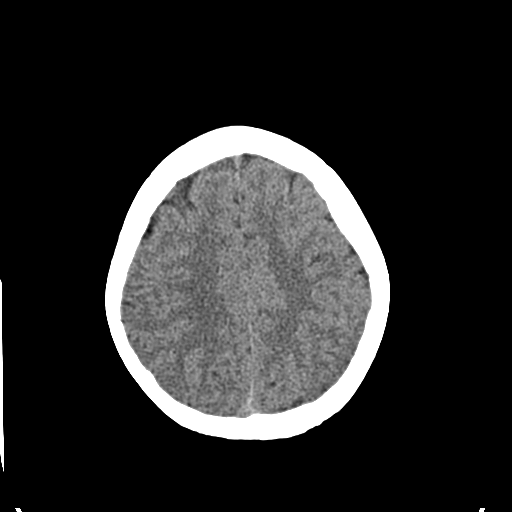
[im 24/32  brain]
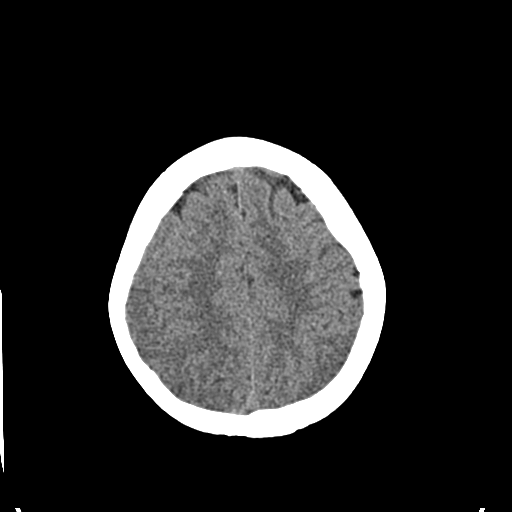
[im 24/32  bone]
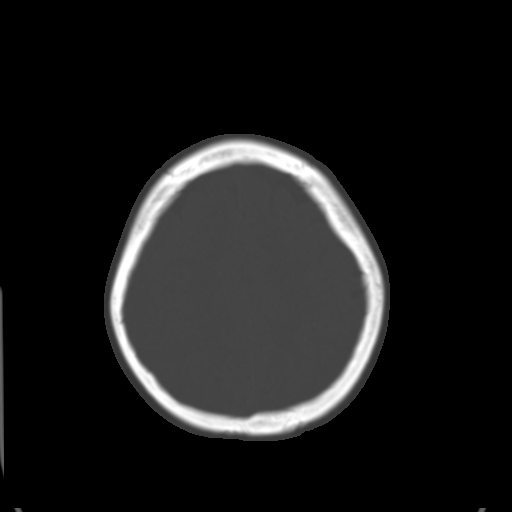
[im 26/32  brain]
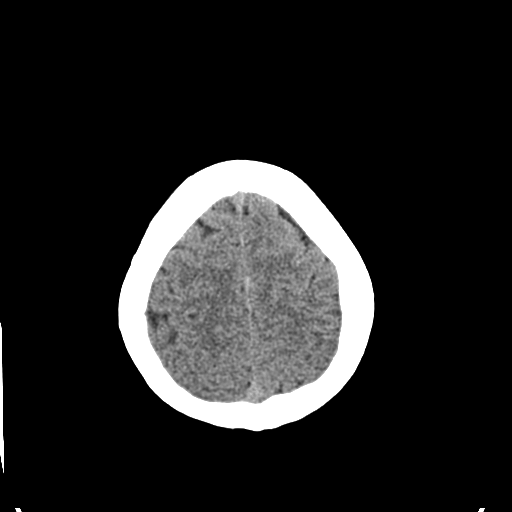
[im 28/32  brain]
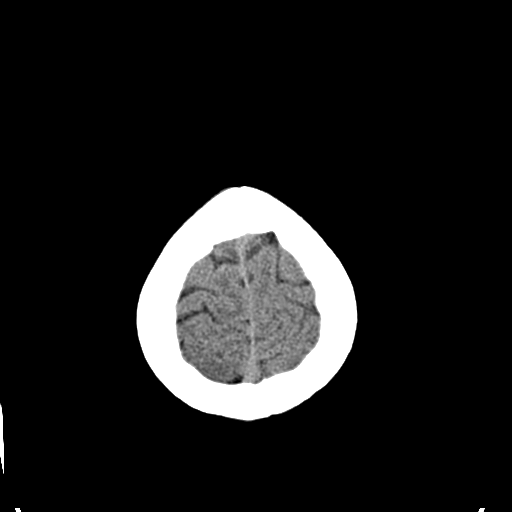
[im 30/32  brain]
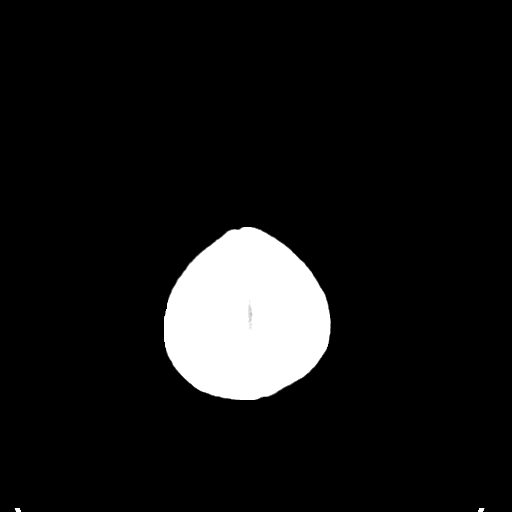

[16 of 30 positions shown; findings below may reference images not displayed]

FINDINGS: The brain has a normal appearance without evidence for
hemorrhage, acute infarction, hydrocephalus, or mass lesion.  There
is no extra axial fluid collection.  The skull and paranasal
sinuses are normal.
IMPRESSION: Normal CT of the head without contrast.

## 2012-04-22 NOTE — Assessment & Plan Note (Signed)
Acute on chronic sinusitis . No actual purulence on todays exam Plan Finish keflex Rx by ED/PCP Stay on nasonex Stay on claritin Stay on mucinex DM or tessalon Use pepcid or PPI for GERD Pulm f/u prn Case discussed with Dr Linford Arnold.  This pt would be an ideal candidate for [facility] care management.  She is in [facility] care now. We need to figure out how to keep her from using the ED so compulsively

## 2012-04-22 NOTE — Assessment & Plan Note (Signed)
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  She is not acutely ill at this time

## 2012-04-22 NOTE — Patient Instructions (Addendum)
Finish your keflex Stay on nasonex and antihistamine Use the tessalon for cough suppression or use the mucinex DM Stay on prevacid or pepcid for acid control
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
## 2012-04-23 DIAGNOSIS — N8501 Benign endometrial hyperplasia: Secondary | ICD-10-CM | POA: Diagnosis not present

## 2012-04-23 DIAGNOSIS — C519 Malignant neoplasm of vulva, unspecified: Secondary | ICD-10-CM | POA: Diagnosis not present

## 2012-04-23 DIAGNOSIS — F339 Major depressive disorder, recurrent, unspecified: Secondary | ICD-10-CM | POA: Diagnosis not present

## 2012-04-23 LAB — CLOSTRIDIUM DIFFICILE TOXIN

## 2012-04-24 ENCOUNTER — Telehealth: Payer: Self-pay | Admitting: Family Medicine

## 2012-04-24 NOTE — Telephone Encounter (Signed)
Please refer patient to [facility] for case management. She has had 18 visits to the emergency room in the last 6 months. 0 admissions.  Let me know if you can't find the phone number.

## 2012-04-25 ENCOUNTER — Ambulatory Visit (INDEPENDENT_AMBULATORY_CARE_PROVIDER_SITE_OTHER): Payer: Medicare Other | Admitting: Family Medicine

## 2012-04-25 ENCOUNTER — Encounter: Payer: Self-pay | Admitting: Family Medicine

## 2012-04-25 VITALS — BP 148/98 | HR 103 | Wt 180.0 lb

## 2012-04-25 DIAGNOSIS — R220 Localized swelling, mass and lump, head: Secondary | ICD-10-CM | POA: Diagnosis not present

## 2012-04-25 DIAGNOSIS — J069 Acute upper respiratory infection, unspecified: Secondary | ICD-10-CM

## 2012-04-25 DIAGNOSIS — K1379 Other lesions of oral mucosa: Secondary | ICD-10-CM

## 2012-04-25 DIAGNOSIS — R221 Localized swelling, mass and lump, neck: Secondary | ICD-10-CM

## 2012-04-25 MED ORDER — MOXIFLOXACIN HCL 400 MG PO TABS: 400.0000 mg | ORAL_TABLET | Freq: Every day | ORAL | Status: AC

## 2012-04-25 NOTE — Telephone Encounter (Signed)
tammy called and gave them pt info

## 2012-04-25 NOTE — Patient Instructions (Addendum)
Try to complete the antibiotic. Call me if not tolerating well if just not better in one week.

## 2012-04-25 NOTE — Progress Notes (Signed)
  Subjective:    Patient ID: Jennifer [name], female    DOB: 06/27/1966, 46 y.o.   MRN: [id number]  HPI URI - She is not better on the antibiotics. She was unable to complete them bc of the tongue and lip swelling. Says has'nt been eaitn gas much.  She is still cough adn getting a lot of mucous productions.   She still has the sore swollen place in her mouth. Says she now has a white papule on the left side of tongue.  Says he was able to take Avelox during her Pagets.    Review of Systems     Objective:   Physical Exam  Constitutional: She is oriented to person, place, and time. She appears well-developed and well-nourished.  HENT:  Head: Normocephalic and atraumatic.  Right Ear: External ear normal.  Left Ear: External ear normal.  Nose: Nose normal.  Mouth/Throat: Oropharynx is clear and moist.       TMs and canals are clear.   Eyes: Conjunctivae normal and EOM are normal. Pupils are equal, round, and reactive to light.  Neck: Neck supple. No thyromegaly present.  Cardiovascular: Normal rate, regular rhythm and normal heart sounds.   Pulmonary/Chest: Effort normal and breath sounds normal. She has no wheezes.  Lymphadenopathy:    She has no cervical adenopathy.  Neurological: She is alert and oriented to person, place, and time.  Skin: Skin is warm and dry.  Psychiatric: She has a normal mood and affect.          Assessment & Plan:  URI - will start  Avelox 400mg  daily x 5 days. Unfortunately we do not have any samples. If after her five-day treatment she's not feeling significantly better then let me know. She does report that overall she feels better than she did a month ago. She may be eventually getting better on her own. She still very thin they're concerned about the increased mucus and cough. Her that I think it's because she still has a partially untreated infection.  Swollen tissue  in her mouth. Want to see if improves on the ABX. Told her that this could be lymph  tissue. Did offer to write for Magic mouthwash to help soothe her mouth but she declined saying the last 2 or 3 times that the prescription was written for her her insurance would not cover it. She thinks she has some old prescription of chlorhexidine mouth rinse at home and says she will try that.  She does have a followup appointment in November with her ENT. She reports that her ENT would not see her sooner.

## 2012-04-26 ENCOUNTER — Telehealth: Payer: Self-pay | Admitting: Family Medicine

## 2012-04-26 DIAGNOSIS — R059 Cough, unspecified: Secondary | ICD-10-CM | POA: Diagnosis not present

## 2012-04-26 DIAGNOSIS — M542 Cervicalgia: Secondary | ICD-10-CM | POA: Diagnosis not present

## 2012-04-26 DIAGNOSIS — R498 Other voice and resonance disorders: Secondary | ICD-10-CM | POA: Diagnosis not present

## 2012-04-26 DIAGNOSIS — R05 Cough: Secondary | ICD-10-CM | POA: Diagnosis not present

## 2012-04-26 DIAGNOSIS — Z79899 Other long term (current) drug therapy: Secondary | ICD-10-CM | POA: Diagnosis not present

## 2012-04-26 DIAGNOSIS — R490 Dysphonia: Secondary | ICD-10-CM | POA: Diagnosis not present

## 2012-04-26 NOTE — Telephone Encounter (Signed)
Called [facility] to get them to contact patient to set up some resources.

## 2012-04-29 DIAGNOSIS — K219 Gastro-esophageal reflux disease without esophagitis: Secondary | ICD-10-CM | POA: Diagnosis not present

## 2012-04-29 DIAGNOSIS — J343 Hypertrophy of nasal turbinates: Secondary | ICD-10-CM | POA: Diagnosis not present

## 2012-04-29 DIAGNOSIS — J310 Chronic rhinitis: Secondary | ICD-10-CM | POA: Diagnosis not present

## 2012-04-29 DIAGNOSIS — R300 Dysuria: Secondary | ICD-10-CM | POA: Diagnosis not present

## 2012-04-30 ENCOUNTER — Encounter: Payer: Self-pay | Admitting: *Deleted

## 2012-05-01 ENCOUNTER — Telehealth: Payer: Self-pay | Admitting: Critical Care Medicine

## 2012-05-01 DIAGNOSIS — N859 Noninflammatory disorder of uterus, unspecified: Secondary | ICD-10-CM | POA: Diagnosis not present

## 2012-05-01 DIAGNOSIS — F339 Major depressive disorder, recurrent, unspecified: Secondary | ICD-10-CM | POA: Diagnosis not present

## 2012-05-01 DIAGNOSIS — N8501 Benign endometrial hyperplasia: Secondary | ICD-10-CM | POA: Diagnosis not present

## 2012-05-01 NOTE — Telephone Encounter (Signed)
Dismissal Letter sent by Certified Mail 05/01/2012  Received the Return Receipt showing the patient has the Dismissal Letter 05/06/2012

## 2012-05-02 DIAGNOSIS — D259 Leiomyoma of uterus, unspecified: Secondary | ICD-10-CM | POA: Diagnosis not present

## 2012-05-02 DIAGNOSIS — N898 Other specified noninflammatory disorders of vagina: Secondary | ICD-10-CM | POA: Diagnosis not present

## 2012-05-02 DIAGNOSIS — Z9889 Other specified postprocedural states: Secondary | ICD-10-CM | POA: Diagnosis not present

## 2012-05-02 DIAGNOSIS — N72 Inflammatory disease of cervix uteri: Secondary | ICD-10-CM | POA: Diagnosis not present

## 2012-05-02 DIAGNOSIS — Z79899 Other long term (current) drug therapy: Secondary | ICD-10-CM | POA: Diagnosis not present

## 2012-05-05 DIAGNOSIS — M889 Osteitis deformans of unspecified bone: Secondary | ICD-10-CM | POA: Diagnosis not present

## 2012-05-05 DIAGNOSIS — J029 Acute pharyngitis, unspecified: Secondary | ICD-10-CM | POA: Diagnosis not present

## 2012-05-05 DIAGNOSIS — I10 Essential (primary) hypertension: Secondary | ICD-10-CM | POA: Diagnosis not present

## 2012-05-05 DIAGNOSIS — D649 Anemia, unspecified: Secondary | ICD-10-CM | POA: Diagnosis not present

## 2012-05-05 DIAGNOSIS — R51 Headache: Secondary | ICD-10-CM | POA: Diagnosis not present

## 2012-05-05 DIAGNOSIS — Z9889 Other specified postprocedural states: Secondary | ICD-10-CM | POA: Diagnosis not present

## 2012-05-05 DIAGNOSIS — H9209 Otalgia, unspecified ear: Secondary | ICD-10-CM | POA: Diagnosis not present

## 2012-05-05 DIAGNOSIS — Z79899 Other long term (current) drug therapy: Secondary | ICD-10-CM | POA: Diagnosis not present

## 2012-05-06 DIAGNOSIS — R634 Abnormal weight loss: Secondary | ICD-10-CM | POA: Diagnosis not present

## 2012-05-06 DIAGNOSIS — F339 Major depressive disorder, recurrent, unspecified: Secondary | ICD-10-CM | POA: Diagnosis not present

## 2012-05-06 DIAGNOSIS — R7309 Other abnormal glucose: Secondary | ICD-10-CM | POA: Diagnosis not present

## 2012-05-06 DIAGNOSIS — E876 Hypokalemia: Secondary | ICD-10-CM | POA: Diagnosis not present

## 2012-05-07 ENCOUNTER — Telehealth: Payer: Self-pay | Admitting: *Deleted

## 2012-05-07 NOTE — Telephone Encounter (Signed)
Pt calls and states that her right side tonsil still swollen and still feels sick. Finished antibiotic and states did not feel any better while on meds. Still present and wants to know what to do. Tongue is dry. Has sucked on candy to try increase saliva production and this did not help.

## 2012-05-07 NOTE — Telephone Encounter (Signed)
Was she able to get in with ENT/allergy to look at her throat?

## 2012-05-07 NOTE — Telephone Encounter (Signed)
Left message to call office back

## 2012-05-08 ENCOUNTER — Encounter: Payer: Self-pay | Admitting: Family Medicine

## 2012-05-08 ENCOUNTER — Encounter (HOSPITAL_BASED_OUTPATIENT_CLINIC_OR_DEPARTMENT_OTHER): Payer: Self-pay

## 2012-05-08 ENCOUNTER — Emergency Department (HOSPITAL_BASED_OUTPATIENT_CLINIC_OR_DEPARTMENT_OTHER)
Admission: EM | Admit: 2012-05-08 | Discharge: 2012-05-08 | Disposition: A | Discharge status: Home / Self Care | Payer: Medicare Other | Attending: Emergency Medicine | Admitting: Emergency Medicine

## 2012-05-08 DIAGNOSIS — J45909 Unspecified asthma, uncomplicated: Secondary | ICD-10-CM | POA: Diagnosis not present

## 2012-05-08 DIAGNOSIS — I10 Essential (primary) hypertension: Secondary | ICD-10-CM | POA: Diagnosis not present

## 2012-05-08 DIAGNOSIS — Z880 Allergy status to penicillin: Secondary | ICD-10-CM | POA: Insufficient documentation

## 2012-05-08 DIAGNOSIS — R7309 Other abnormal glucose: Secondary | ICD-10-CM | POA: Diagnosis not present

## 2012-05-08 DIAGNOSIS — F4310 Post-traumatic stress disorder, unspecified: Secondary | ICD-10-CM | POA: Insufficient documentation

## 2012-05-08 DIAGNOSIS — G4730 Sleep apnea, unspecified: Secondary | ICD-10-CM | POA: Diagnosis not present

## 2012-05-08 DIAGNOSIS — Z87891 Personal history of nicotine dependence: Secondary | ICD-10-CM | POA: Insufficient documentation

## 2012-05-08 DIAGNOSIS — E269 Hyperaldosteronism, unspecified: Secondary | ICD-10-CM | POA: Diagnosis not present

## 2012-05-08 DIAGNOSIS — M79602 Pain in left arm: Secondary | ICD-10-CM

## 2012-05-08 DIAGNOSIS — K219 Gastro-esophageal reflux disease without esophagitis: Secondary | ICD-10-CM | POA: Insufficient documentation

## 2012-05-08 DIAGNOSIS — R1115 Cyclical vomiting syndrome unrelated to migraine: Secondary | ICD-10-CM | POA: Diagnosis not present

## 2012-05-08 DIAGNOSIS — M79609 Pain in unspecified limb: Secondary | ICD-10-CM | POA: Insufficient documentation

## 2012-05-08 DIAGNOSIS — E785 Hyperlipidemia, unspecified: Secondary | ICD-10-CM | POA: Insufficient documentation

## 2012-05-08 DIAGNOSIS — Z882 Allergy status to sulfonamides status: Secondary | ICD-10-CM | POA: Insufficient documentation

## 2012-05-08 DIAGNOSIS — E876 Hypokalemia: Secondary | ICD-10-CM | POA: Diagnosis not present

## 2012-05-08 DIAGNOSIS — F0781 Postconcussional syndrome: Secondary | ICD-10-CM | POA: Diagnosis not present

## 2012-05-08 LAB — TROPONIN I: Troponin I: <0.3 ng/mL (ref <0.30)

## 2012-05-08 LAB — BASIC METABOLIC PANEL
BUN: 14 mg/dL (ref 6–23)
CO2: 24 mEq/L (ref 19–32)
Calcium: 9.2 mg/dL (ref 8.4–10.5)
Chloride: 103 mEq/L (ref 96–112)
Creatinine, Ser: 0.5 mg/dL (ref 0.50–1.10)
GFR calc Af Amer: >90 mL/min (ref >90)
GFR calc non Af Amer: >90 mL/min (ref >90)
Glucose, Bld: 105 mg/dL — ABNORMAL HIGH (ref 70–99)
Potassium: 3.5 mEq/L (ref 3.5–5.1)
Sodium: 139 mEq/L (ref 135–145)

## 2012-05-08 NOTE — ED Notes (Signed)
Pt given discharge instructions. Verbalized understanding to take ibuprofen for pain and follow up with PCP.  Ambulatory with steady gait.

## 2012-05-08 NOTE — ED Notes (Signed)
Pt sts left arm starting hurting last night.Has multiple concerns today
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
## 2012-05-08 NOTE — ED Notes (Addendum)
Pt sts L arm started hurting last night and it has become increasingly worse.  Pt sts it woke her up last night.  Pain level is currently a 7 out of 10.  Pt sts it is difficult to hold arm up.  Pt sts she has not taken any pain reliever this morning. Hx of L Rotary tear and arthritis.

## 2012-05-08 NOTE — ED Notes (Signed)
MD at bedside. 

## 2012-05-09 ENCOUNTER — Ambulatory Visit (INDEPENDENT_AMBULATORY_CARE_PROVIDER_SITE_OTHER): Payer: Medicare Other | Admitting: Sports Medicine

## 2012-05-09 ENCOUNTER — Encounter: Payer: Self-pay | Admitting: Family Medicine

## 2012-05-09 ENCOUNTER — Ambulatory Visit (INDEPENDENT_AMBULATORY_CARE_PROVIDER_SITE_OTHER): Payer: Medicare Other | Admitting: Family Medicine

## 2012-05-09 VITALS — BP 134/93 | HR 95 | Wt 181.0 lb

## 2012-05-09 DIAGNOSIS — M79602 Pain in left arm: Secondary | ICD-10-CM

## 2012-05-09 DIAGNOSIS — E269 Hyperaldosteronism, unspecified: Secondary | ICD-10-CM

## 2012-05-09 DIAGNOSIS — R070 Pain in throat: Secondary | ICD-10-CM | POA: Diagnosis not present

## 2012-05-09 DIAGNOSIS — E2609 Other primary hyperaldosteronism: Secondary | ICD-10-CM

## 2012-05-09 DIAGNOSIS — M79609 Pain in unspecified limb: Secondary | ICD-10-CM | POA: Diagnosis not present

## 2012-05-09 DIAGNOSIS — M25519 Pain in unspecified shoulder: Secondary | ICD-10-CM

## 2012-05-09 DIAGNOSIS — F339 Major depressive disorder, recurrent, unspecified: Secondary | ICD-10-CM | POA: Diagnosis not present

## 2012-05-09 MED ORDER — DEXAMETHASONE 4 MG PO TABS: 4.0000 mg | ORAL_TABLET | Freq: Two times a day (BID) | ORAL | Status: DC

## 2012-05-09 NOTE — Assessment & Plan Note (Addendum)
Symptoms today are suggestive of a left-sided C5-C6 cervical radiculopathy. She did recently have an MRI of her left shoulder that was essentially pristine with the exception of a small amount of a.c. joint DJD. We will use low dose of Decadron, and I will put her into formal physical therapy.  She does endorse some manic-type responses to steroids including long acting Depo-Medrol in the past. She agrees to stop the medication and give [place] a call if she has any adverse effects with the Decadron. She is asking for this to be sent to [facility] for physical therapyt , which I think is acceptable. I would like them to work on both her lumbar spine as well as her cervical spine. I will see her back in 4-6 weeks for both her low back with radiculopathy, as well as her cervical radiculopathy. If no better, we can consider MRI. MRI would be in anticipation of interventional injection planning.

## 2012-05-09 NOTE — Progress Notes (Signed)
Subjective:    I'm seeing this patient as a consultation for: Dr. Linford Arnold  CC: Left shoulder pain  HPI: Jennifer [name] comes in with a several-day history of pain that she localizes starting from her left lateral neck going over her lateral deltoid over her lateral upper arm, lateral forearm, and hand. It is described as a soreness. She does not recall any specific injuries, or inciting activities. I did see her previously as a consult for her lumbar spine, this is a new, and different pathology today. She denies any pain with overhead activities, and has in fact had a fairly extensive shoulder workup in the past that included an MRI that was essentially pristine. There is a minimal rotator cuff edema that is unlikely related to her current symptoms. 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 She's never had MRI, or any further were dedicated imaging to her neck.  Of note, regarding her lumbar spine she has still not yet undergone physical therapy.  Past medical history, Surgical history, Family history, Social history, Allergies, and medications have been entered into the medical record, reviewed, and no changes needed.   Review of Systems: No headache, visual changes, nausea, vomiting, diarrhea, constipation, dizziness, abdominal pain, skin rash, fevers, chills, night sweats, weight loss, body aches, joint swelling, muscle aches, chest pain, or shortness of breath.   Objective:   Vitals:  Afebrile, vital signs stable. General: Well Developed, well nourished, and in no acute distress.  Neuro/Psych: Alert and oriented x3, extra-ocular muscles intact, able to move all 4 extremities.  Skin: Warm and dry, no rashes noted.  Respiratory: Not using accessory muscles, speaking in full sentences, trachea midline.  Cardiovascular: Pulses palpable, no extremity edema. Abdomen: Does not appear distended. Neck: Inspection unremarkable. No  palpable stepoffs. Negative Spurling's maneuver. Full neck range of motion Grip strength and sensation normal in bilateral hands Strength good C4 to T1 distribution No sensory change to C4 to T1 Negative Hoffman sign bilaterally Reflexes normal  Left Shoulder: Inspection reveals no abnormalities, atrophy or asymmetry. Palpation is normal with no tenderness over AC joint or bicipital groove. ROM is full in all planes. Rotator cuff strength normal throughout. No signs of impingement with negative Neer and Hawkin's tests, empty can sign. Speeds and Yergason's tests normal. No labral pathology noted with negative Obrien's, negative clunk and good stability. Normal scapular function observed. No painful arc and no drop arm sign. No apprehension sign  I did review the neck, and soft tissue x-ray, not dedicated to the cervical spine. She does have what appears to be multilevel facet degenerative changes. Overall her disc spaces were well maintained.  I also reviewed a CT scan of her neck, soft tissues. This was again, not dedicated to her cervical spine. She only had minimal facet degenerative changes on this modality, with overall well-maintained disc height.   Impression and Recommendations:   This case required medical decision making of moderate complexity.

## 2012-05-09 NOTE — Progress Notes (Signed)
Subjective:    Patient ID: Jennifer [name], female    DOB: October 05, 1965, 46 y.o.   MRN: [id number]  HPI Saw endocrine again yesterday.  Says was placed back on potassium bc it was low again. Has f/u in one week.  Says they are talking about putting her on a med for adrenal issues (primary hyperaldosteronism).  Says blood pressure is elevated.    Has been elevated a multiple locations.  He is high today. Chest pain or palpitations the last couple of days. She has a history of recurrent chest pain.  Stil lhas some swelling in the post right throat.  Says has been using Advil some to help with the inflammation.  Now starting to get some stomach irritation.  Says was having coughing fit last night.  Says coughed so hard she vomited and chunks of food cam up as well.    Went to ED yesterday for left arm pain.  Usually hurts in her shoulder but going to her fingertips. Hurting in all her fingertips.  Hurst in her upper and lower arm.  Did have a fall 2 months ago.  Says has ben having some pain in her neck too.    Review of Systems     Objective:   Physical Exam  Constitutional: She is oriented to person, place, and time. She appears well-developed and well-nourished.  HENT:  Head: Normocephalic and atraumatic.  Right Ear: External ear normal.  Left Ear: External ear normal.  Nose: Nose normal.  Mouth/Throat: Oropharynx is clear and moist.       Unable to palpate a lump on the right pharynx.  Cardiovascular: Normal rate, regular rhythm and normal heart sounds.   Pulmonary/Chest: Effort normal and breath sounds normal.  Musculoskeletal:       Decreased neck extension. Normal flexion. Normal rotation right and left. Normal side bending. Left shoulder with normal range of motion. She has some tenderness over the forearm. Elbow and wrist with normal range of motion. Fingers with normal range of motion.  Neurological: She is alert and oriented to person, place, and time.  Skin: Skin is warm and  dry.  Psychiatric: She has a normal mood and affect. Her behavior is normal.          Assessment & Plan:  Primary hyperaldosteronism-she has followup with endocrine schedule. If this had not treat her hyperaldosteronism than I do think that she needs to start taking her blood pressure medication daily. I explained that she needs to get her average blood pressure down so that it when it does spike it's not so high. She does tend to fluctuate.  Throat irritation- she is overall improved but has not completely resolved and she is very concerned about this. Asked that we can retry ascending over a Magic mouthwash. We called the pharmacy and they will not cover it. They said that she can swish with Maalox and liquid Benadryl and swallow. This may help provide some relief. Her followup with ENT if not November. I was unable to palpate a lump today on exam like I was at the previous visit. I think it significantly improved. Do not see any food particles on in the posterior throat.  Left arm pain - this could be coming from her shoulder or possible her neck since it's radiating down to her fingertips. She does have a history of some arthritis in her neck and she did have a fall couple of months ago. Would like her to see my partner Dr.  Thekkekandam for this since she is already been to the ED for it. He may recommend physical therapy. She does not tolerate oral steroids. Already seeing her for her leg and recommended PT but she says she's been unable to go because she has been sick.   Using [facility].    25 minutes spent face-to-face with over half the time in discussion and counseling about her throat, arm and primary hyperaldosteronism.

## 2012-05-09 NOTE — ED Provider Notes (Signed)
I saw and evaluated the patient, reviewed the resident's note and I agree with the findings and plan.   .Face to face Exam:  General:  Awake HEENT:  Atraumatic Resp:  Normal effort Abd:  Nondistended Neuro:No focal weakness Lymph: No adenopathy   Nelia Shi, MD 05/09/12 1346

## 2012-05-10 DIAGNOSIS — N8501 Benign endometrial hyperplasia: Secondary | ICD-10-CM | POA: Diagnosis not present

## 2012-05-10 DIAGNOSIS — M542 Cervicalgia: Secondary | ICD-10-CM | POA: Diagnosis not present

## 2012-05-10 DIAGNOSIS — R209 Unspecified disturbances of skin sensation: Secondary | ICD-10-CM | POA: Diagnosis not present

## 2012-05-10 DIAGNOSIS — M549 Dorsalgia, unspecified: Secondary | ICD-10-CM | POA: Diagnosis not present

## 2012-05-10 DIAGNOSIS — C519 Malignant neoplasm of vulva, unspecified: Secondary | ICD-10-CM | POA: Diagnosis not present

## 2012-05-11 DIAGNOSIS — M542 Cervicalgia: Secondary | ICD-10-CM | POA: Diagnosis not present

## 2012-05-11 DIAGNOSIS — M79609 Pain in unspecified limb: Secondary | ICD-10-CM | POA: Diagnosis not present

## 2012-05-13 DIAGNOSIS — J029 Acute pharyngitis, unspecified: Secondary | ICD-10-CM | POA: Diagnosis not present

## 2012-05-13 DIAGNOSIS — J329 Chronic sinusitis, unspecified: Secondary | ICD-10-CM | POA: Diagnosis not present

## 2012-05-14 ENCOUNTER — Telehealth: Payer: Self-pay | Admitting: *Deleted

## 2012-05-14 NOTE — Telephone Encounter (Signed)
Pt has a pending appt with Dr. Delford Field on Thursday in [place].  This appt was scheduled for pt on 04/09/12.  Since then, she has been discharged from Pulmonary.  I have lmom asking pt to call back asap to inform her that we will cancel this appt.  We will be available for 30 days from 9/21 for emergency appts only.    Note:  I spoke with Synetta Fail in Medical Records.  States she did receive the conformation that pt received the discharge letter on 9/21.  I will wait until I speak with pt to cancel this appt.

## 2012-05-14 NOTE — Telephone Encounter (Signed)
lmomtcb  

## 2012-05-15 DIAGNOSIS — E876 Hypokalemia: Secondary | ICD-10-CM | POA: Diagnosis not present

## 2012-05-15 DIAGNOSIS — K209 Esophagitis, unspecified without bleeding: Secondary | ICD-10-CM | POA: Diagnosis not present

## 2012-05-15 DIAGNOSIS — E269 Hyperaldosteronism, unspecified: Secondary | ICD-10-CM | POA: Diagnosis not present

## 2012-05-15 DIAGNOSIS — R002 Palpitations: Secondary | ICD-10-CM | POA: Diagnosis not present

## 2012-05-15 DIAGNOSIS — F339 Major depressive disorder, recurrent, unspecified: Secondary | ICD-10-CM | POA: Diagnosis not present

## 2012-05-15 DIAGNOSIS — N760 Acute vaginitis: Secondary | ICD-10-CM | POA: Diagnosis not present

## 2012-05-15 DIAGNOSIS — I498 Other specified cardiac arrhythmias: Secondary | ICD-10-CM | POA: Diagnosis not present

## 2012-05-15 DIAGNOSIS — I10 Essential (primary) hypertension: Secondary | ICD-10-CM | POA: Diagnosis not present

## 2012-05-15 DIAGNOSIS — R0789 Other chest pain: Secondary | ICD-10-CM | POA: Diagnosis not present

## 2012-05-15 DIAGNOSIS — Z79899 Other long term (current) drug therapy: Secondary | ICD-10-CM | POA: Diagnosis not present

## 2012-05-15 NOTE — Telephone Encounter (Signed)
Called, spoke with pt.  Informed her we will cx appt tomorrow in [place] with Dr. Delford Field.  Advised he will be available for emergencies only for 30 days starting from 05/04/12.  She stated, "Ok. I get the point.  Thank you."  And then hung up the phone.    Appt has been cancelled.

## 2012-05-16 ENCOUNTER — Ambulatory Visit: Payer: Medicare Other | Admitting: Critical Care Medicine

## 2012-05-16 ENCOUNTER — Emergency Department (HOSPITAL_BASED_OUTPATIENT_CLINIC_OR_DEPARTMENT_OTHER)
Admission: EM | Admit: 2012-05-16 | Discharge: 2012-05-16 | Disposition: A | Discharge status: Home / Self Care | Payer: Medicare Other | Attending: Emergency Medicine | Admitting: Emergency Medicine

## 2012-05-16 DIAGNOSIS — Z6379 Other stressful life events affecting family and household: Secondary | ICD-10-CM | POA: Insufficient documentation

## 2012-05-16 DIAGNOSIS — Z803 Family history of malignant neoplasm of breast: Secondary | ICD-10-CM | POA: Insufficient documentation

## 2012-05-16 DIAGNOSIS — R002 Palpitations: Secondary | ICD-10-CM | POA: Diagnosis not present

## 2012-05-16 DIAGNOSIS — Z880 Allergy status to penicillin: Secondary | ICD-10-CM | POA: Diagnosis not present

## 2012-05-16 DIAGNOSIS — Z881 Allergy status to other antibiotic agents status: Secondary | ICD-10-CM | POA: Diagnosis not present

## 2012-05-16 DIAGNOSIS — Z8249 Family history of ischemic heart disease and other diseases of the circulatory system: Secondary | ICD-10-CM | POA: Insufficient documentation

## 2012-05-16 DIAGNOSIS — R51 Headache: Secondary | ICD-10-CM | POA: Diagnosis not present

## 2012-05-16 DIAGNOSIS — Z8261 Family history of arthritis: Secondary | ICD-10-CM | POA: Diagnosis not present

## 2012-05-16 DIAGNOSIS — G8929 Other chronic pain: Secondary | ICD-10-CM | POA: Insufficient documentation

## 2012-05-16 DIAGNOSIS — I10 Essential (primary) hypertension: Secondary | ICD-10-CM | POA: Insufficient documentation

## 2012-05-16 DIAGNOSIS — F4310 Post-traumatic stress disorder, unspecified: Secondary | ICD-10-CM | POA: Insufficient documentation

## 2012-05-16 DIAGNOSIS — J45909 Unspecified asthma, uncomplicated: Secondary | ICD-10-CM | POA: Diagnosis not present

## 2012-05-16 DIAGNOSIS — Z833 Family history of diabetes mellitus: Secondary | ICD-10-CM | POA: Insufficient documentation

## 2012-05-16 DIAGNOSIS — Z87891 Personal history of nicotine dependence: Secondary | ICD-10-CM | POA: Diagnosis not present

## 2012-05-16 DIAGNOSIS — K219 Gastro-esophageal reflux disease without esophagitis: Secondary | ICD-10-CM | POA: Diagnosis not present

## 2012-05-16 DIAGNOSIS — Z801 Family history of malignant neoplasm of trachea, bronchus and lung: Secondary | ICD-10-CM | POA: Insufficient documentation

## 2012-05-16 DIAGNOSIS — Z818 Family history of other mental and behavioral disorders: Secondary | ICD-10-CM | POA: Insufficient documentation

## 2012-05-16 DIAGNOSIS — Z91010 Allergy to peanuts: Secondary | ICD-10-CM | POA: Diagnosis not present

## 2012-05-16 DIAGNOSIS — Z888 Allergy status to other drugs, medicaments and biological substances status: Secondary | ICD-10-CM | POA: Diagnosis not present

## 2012-05-16 DIAGNOSIS — Z825 Family history of asthma and other chronic lower respiratory diseases: Secondary | ICD-10-CM | POA: Diagnosis not present

## 2012-05-16 DIAGNOSIS — Z882 Allergy status to sulfonamides status: Secondary | ICD-10-CM | POA: Diagnosis not present

## 2012-05-16 DIAGNOSIS — Z808 Family history of malignant neoplasm of other organs or systems: Secondary | ICD-10-CM | POA: Diagnosis not present

## 2012-05-16 LAB — CBC WITH DIFFERENTIAL/PLATELET
Basophils Absolute: 0 10*3/uL (ref 0.0–0.1)
Basophils Relative: 1 % (ref 0–1)
Eosinophils Absolute: 0.2 10*3/uL (ref 0.0–0.7)
Eosinophils Relative: 2 % (ref 0–5)
HCT: 38.9 % (ref 36.0–46.0)
Hemoglobin: 13.2 g/dL (ref 12.0–15.0)
Lymphocytes Relative: 21 % (ref 12–46)
Lymphs Abs: 1.3 10*3/uL (ref 0.7–4.0)
MCH: 29.6 pg (ref 26.0–34.0)
MCHC: 33.9 g/dL (ref 30.0–36.0)
MCV: 87.2 fL (ref 78.0–100.0)
Monocytes Absolute: 0.6 10*3/uL (ref 0.1–1.0)
Monocytes Relative: 10 % (ref 3–12)
Neutro Abs: 4 10*3/uL (ref 1.7–7.7)
Neutrophils Relative %: 65 % (ref 43–77)
Platelets: 238 10*3/uL (ref 150–400)
RBC: 4.46 MIL/uL (ref 3.87–5.11)
RDW: 13.5 % (ref 11.5–15.5)
WBC: 6.2 10*3/uL (ref 4.0–10.5)

## 2012-05-16 LAB — TROPONIN I: Troponin I: <0.3 ng/mL (ref <0.30)

## 2012-05-16 LAB — BASIC METABOLIC PANEL
BUN: 12 mg/dL (ref 6–23)
CO2: 23 mEq/L (ref 19–32)
Calcium: 9.5 mg/dL (ref 8.4–10.5)
Chloride: 103 mEq/L (ref 96–112)
Creatinine, Ser: 0.5 mg/dL (ref 0.50–1.10)
GFR calc Af Amer: >90 mL/min (ref >90)
GFR calc non Af Amer: >90 mL/min (ref >90)
Glucose, Bld: 127 mg/dL — ABNORMAL HIGH (ref 70–99)
Potassium: 3.7 mEq/L (ref 3.5–5.1)
Sodium: 139 mEq/L (ref 135–145)

## 2012-05-16 MED ORDER — SODIUM CHLORIDE 0.9 % IV BOLUS (SEPSIS)
1000.0000 mL | Freq: Once | INTRAVENOUS | Status: AC
  Administered 2012-05-16: 1000 mL via INTRAVENOUS

## 2012-05-16 NOTE — ED Notes (Signed)
Notices that she is unable to get her fluids and food down her throat.  This is a chronic problem that she was suppose to see a GI for today but the appointment was cancelled.

## 2012-05-16 NOTE — ED Notes (Signed)
Pt c/o palpations x 1 day, hx of same
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
## 2012-05-17 DIAGNOSIS — R1310 Dysphagia, unspecified: Secondary | ICD-10-CM | POA: Diagnosis not present

## 2012-05-17 DIAGNOSIS — K229 Disease of esophagus, unspecified: Secondary | ICD-10-CM | POA: Diagnosis not present

## 2012-05-19 IMAGING — CR DG CHEST 2V
2 series · 2 of 2 positions shown · non-contrast
Comparison: 01/05/2011

CLINICAL DATA: Chest pain

CHEST - 2 VIEW

[w chest pa]
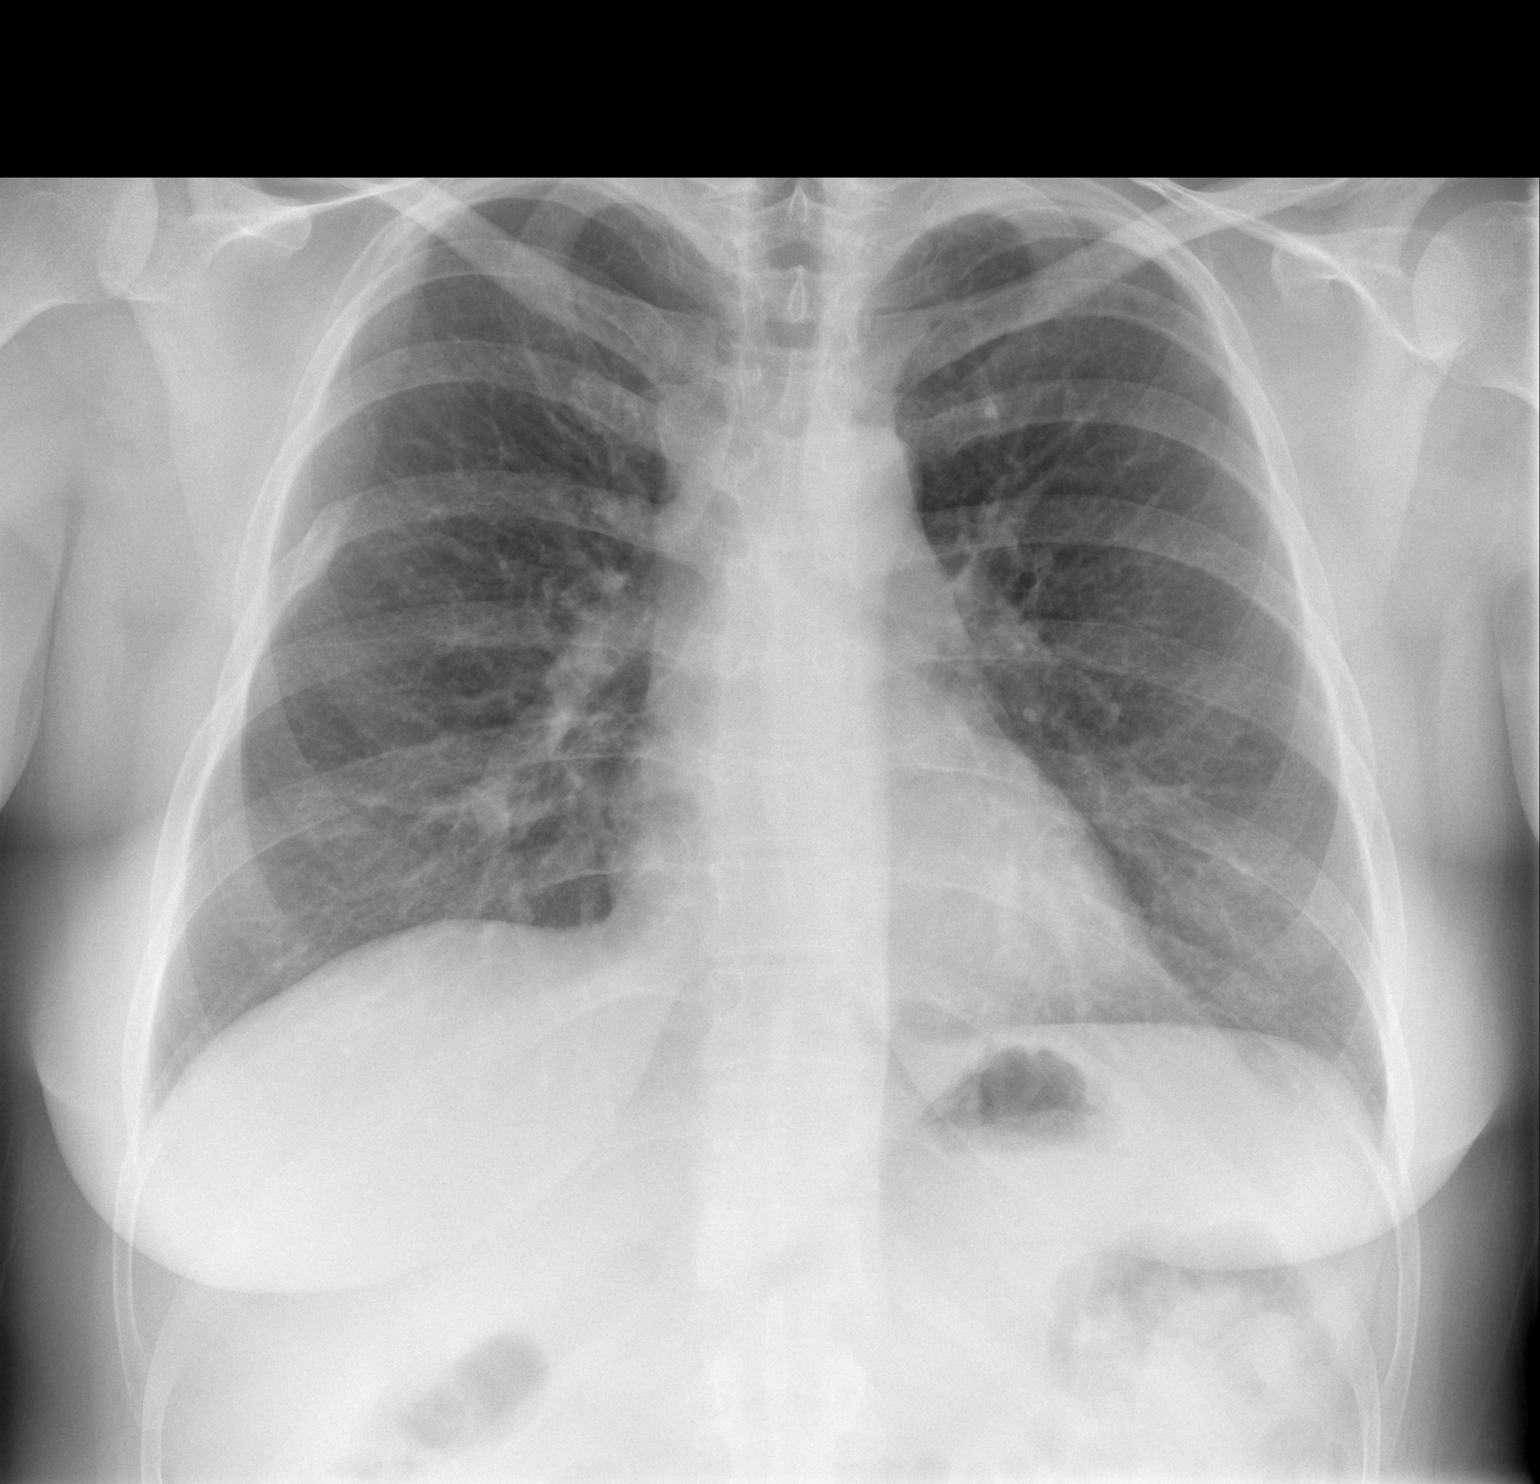

[w chest lat]
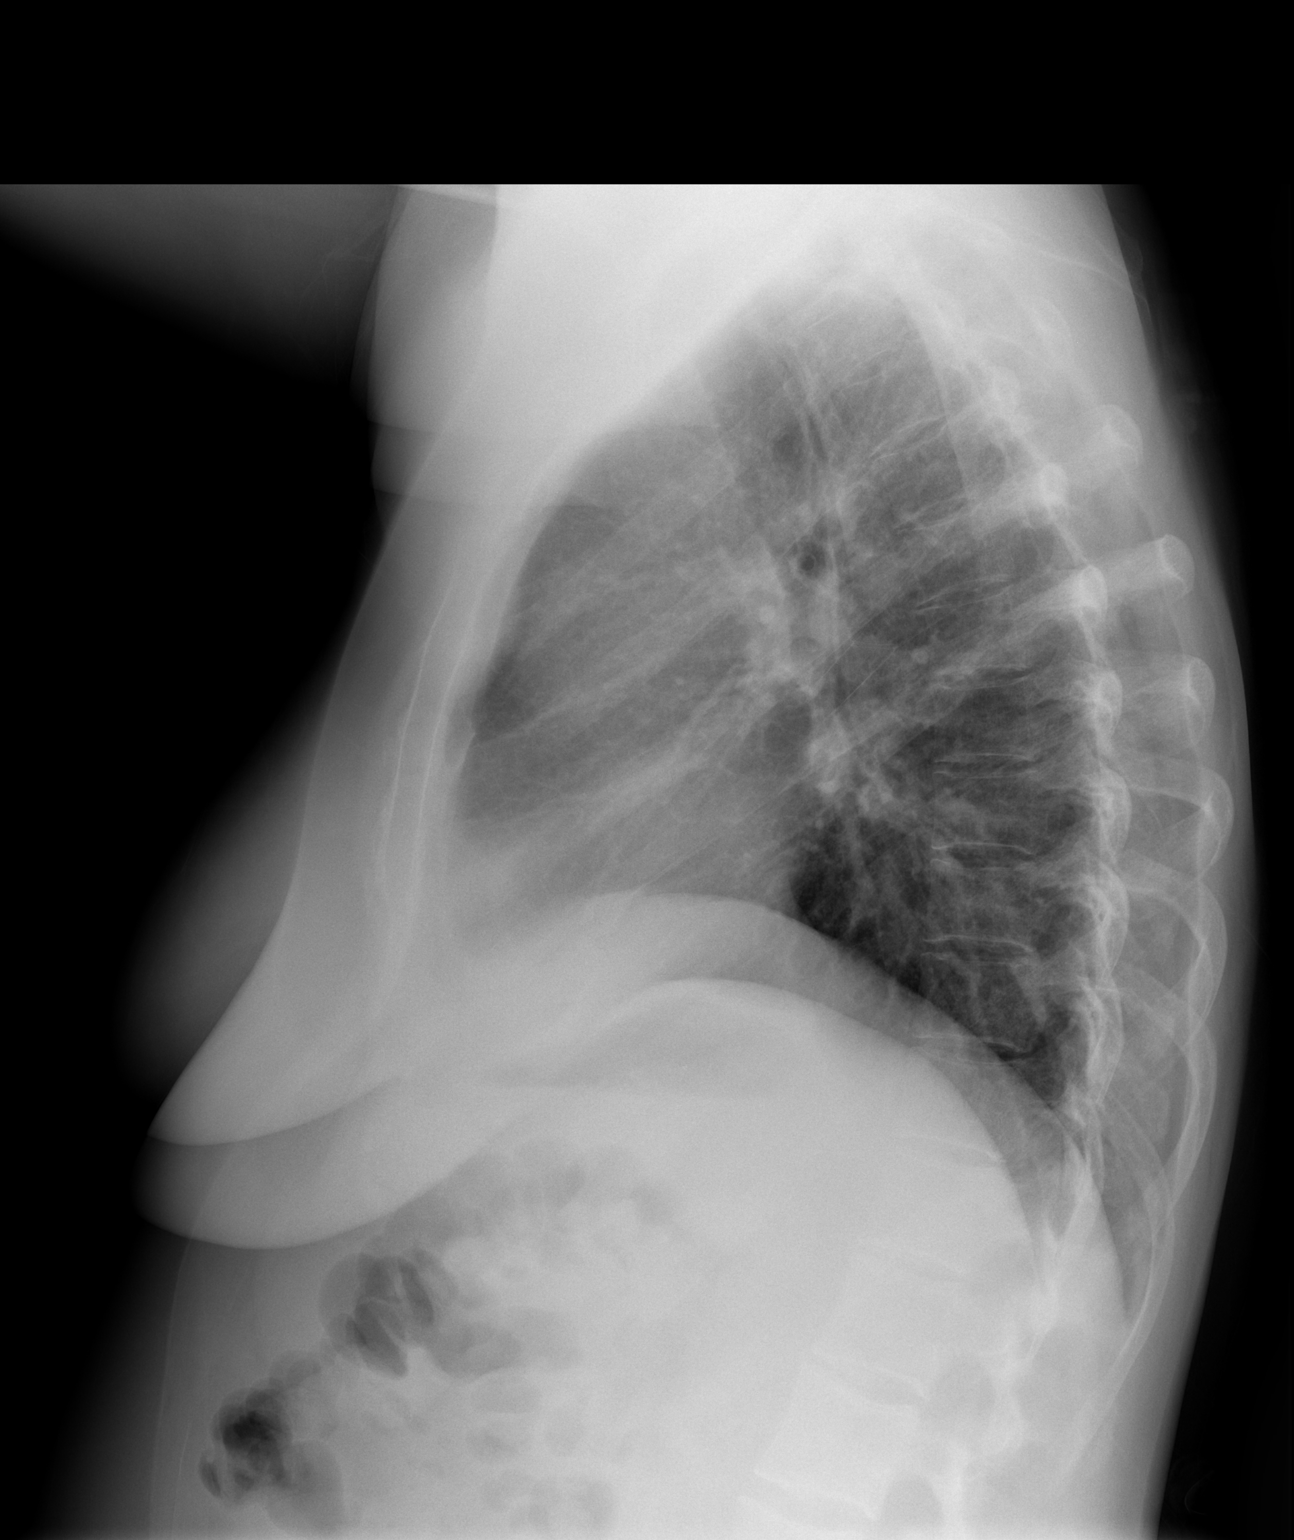

[2 of 2 positions shown; findings below may reference images not displayed]

FINDINGS: Heart size appears normal.

There is no pleural effusion or pulmonary edema identified.

There is no airspace consolidation identified.

Decreased lung volumes.  Old healed right posterior rib fracture is
noted.

The bony thorax is otherwise intact.
IMPRESSION: 1.  No active cardiopulmonary abnormalities.

## 2012-05-20 DIAGNOSIS — F339 Major depressive disorder, recurrent, unspecified: Secondary | ICD-10-CM | POA: Diagnosis not present

## 2012-05-21 DIAGNOSIS — F339 Major depressive disorder, recurrent, unspecified: Secondary | ICD-10-CM | POA: Diagnosis not present

## 2012-05-22 DIAGNOSIS — Z882 Allergy status to sulfonamides status: Secondary | ICD-10-CM | POA: Diagnosis not present

## 2012-05-22 DIAGNOSIS — K5900 Constipation, unspecified: Secondary | ICD-10-CM | POA: Diagnosis not present

## 2012-05-22 DIAGNOSIS — Z79899 Other long term (current) drug therapy: Secondary | ICD-10-CM | POA: Diagnosis not present

## 2012-05-22 DIAGNOSIS — Z881 Allergy status to other antibiotic agents status: Secondary | ICD-10-CM | POA: Diagnosis not present

## 2012-05-22 DIAGNOSIS — J45909 Unspecified asthma, uncomplicated: Secondary | ICD-10-CM | POA: Diagnosis not present

## 2012-05-22 DIAGNOSIS — R197 Diarrhea, unspecified: Secondary | ICD-10-CM | POA: Diagnosis not present

## 2012-05-22 DIAGNOSIS — R1033 Periumbilical pain: Secondary | ICD-10-CM | POA: Diagnosis not present

## 2012-05-22 DIAGNOSIS — R109 Unspecified abdominal pain: Secondary | ICD-10-CM | POA: Diagnosis not present

## 2012-05-22 DIAGNOSIS — Z888 Allergy status to other drugs, medicaments and biological substances status: Secondary | ICD-10-CM | POA: Diagnosis not present

## 2012-05-22 DIAGNOSIS — R1110 Vomiting, unspecified: Secondary | ICD-10-CM | POA: Diagnosis not present

## 2012-05-22 DIAGNOSIS — F339 Major depressive disorder, recurrent, unspecified: Secondary | ICD-10-CM | POA: Diagnosis not present

## 2012-05-22 DIAGNOSIS — Z880 Allergy status to penicillin: Secondary | ICD-10-CM | POA: Diagnosis not present

## 2012-05-23 ENCOUNTER — Telehealth: Payer: Self-pay | Admitting: *Deleted

## 2012-05-23 NOTE — Telephone Encounter (Signed)
Pt states that she doesn't want to go to PT unless there is something they can do that she can't do at home. States she has done this before and everything that was done she could have done herself. States she can put ice on it herself. Her neck is hurting more and that she will not have anything to do with any steroids or steroid injections. States she is not a happy camper right now. Says she spoke with PT today. Please advise.

## 2012-05-24 ENCOUNTER — Ambulatory Visit (INDEPENDENT_AMBULATORY_CARE_PROVIDER_SITE_OTHER): Payer: Medicare Other | Admitting: Sports Medicine

## 2012-05-24 ENCOUNTER — Encounter: Payer: Self-pay | Admitting: Sports Medicine

## 2012-05-24 VITALS — BP 157/95 | HR 120 | Temp 98.1°F | Resp 20

## 2012-05-24 DIAGNOSIS — M542 Cervicalgia: Secondary | ICD-10-CM

## 2012-05-24 DIAGNOSIS — K5900 Constipation, unspecified: Secondary | ICD-10-CM | POA: Diagnosis not present

## 2012-05-24 DIAGNOSIS — R630 Anorexia: Secondary | ICD-10-CM | POA: Diagnosis not present

## 2012-05-24 DIAGNOSIS — K219 Gastro-esophageal reflux disease without esophagitis: Secondary | ICD-10-CM | POA: Diagnosis not present

## 2012-05-24 DIAGNOSIS — G8929 Other chronic pain: Secondary | ICD-10-CM | POA: Diagnosis not present

## 2012-05-24 DIAGNOSIS — R112 Nausea with vomiting, unspecified: Secondary | ICD-10-CM | POA: Diagnosis not present

## 2012-05-24 DIAGNOSIS — M549 Dorsalgia, unspecified: Secondary | ICD-10-CM | POA: Diagnosis not present

## 2012-05-24 DIAGNOSIS — R109 Unspecified abdominal pain: Secondary | ICD-10-CM | POA: Diagnosis not present

## 2012-05-24 DIAGNOSIS — R110 Nausea: Secondary | ICD-10-CM | POA: Diagnosis not present

## 2012-05-24 NOTE — Progress Notes (Signed)
Subjective:    I'm seeing this patient as a consultation for: Dr. Linford Arnold  CC: Left shoulder pain  HPI: Jennifer [name] comes back in in with a history of pain that she localizes starting from her left lateral neck going over her lateral deltoid over her lateral upper arm, lateral forearm, and hand. It is described as a soreness. She does not recall any specific injuries, or inciting activities. I did see her previously for this. She denies any pain with overhead activities, and has in fact had a fairly extensive shoulder workup in the past that included an MRI that was essentially pristine. There is a minimal rotator cuff edema that is unlikely related to her current symptoms. 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 She's never had MRI, or any further were dedicated imaging to her neck.  At the previous visit, I did recommend formal physical therapy, and Decadron. Unfortunately, she has not done any of this. She perseverates on her story, and will not let me know what she actually wants.  Past medical history, Surgical history, Family history, Social history, Allergies, and medications have been entered into the medical record, reviewed, and no changes needed.   Review of Systems: No headache, visual changes, nausea, vomiting, diarrhea, constipation, dizziness, abdominal pain, skin rash, fevers, chills, night sweats, weight loss, body aches, joint swelling, muscle aches, chest pain, or shortness of breath.   Objective:   Vitals:  Afebrile, vital signs stable. General: Well Developed, well nourished, and in no acute distress.  Neuro/Psych: Alert and oriented x3, extra-ocular muscles intact, able to move all 4 extremities.  Skin: Warm and dry, no rashes noted.  Respiratory: Not using accessory muscles, speaking in full sentences, trachea midline.  Cardiovascular: Pulses palpable, no extremity edema. Abdomen: Does not appear  distended. Neck: Inspection unremarkable. No palpable stepoffs. Negative Spurling's maneuver. Full neck range of motion Grip strength and sensation normal in bilateral hands Strength good C4 to T1 distribution No sensory change to C4 to T1 Negative Hoffman sign bilaterally Reflexes normal  Impression and Recommendations:   This case required medical decision making of moderate complexity.

## 2012-05-24 NOTE — Telephone Encounter (Signed)
Please ask her what she would like me to do for her, I want to help her with her pain, but the right way.  PT can do traction, iontophoresis, phonophoresis, TENS, and other things she cannot do at home.

## 2012-05-24 NOTE — Assessment & Plan Note (Signed)
Needs to do PT. Will return after. Include TENS, Traction. No ionto or phono.

## 2012-05-24 NOTE — Telephone Encounter (Signed)
Pt is coming in to see Dr. Benjamin Stain.

## 2012-05-24 NOTE — Telephone Encounter (Signed)
LMOM with response and for her to call back.

## 2012-05-25 DIAGNOSIS — M542 Cervicalgia: Secondary | ICD-10-CM | POA: Diagnosis not present

## 2012-05-25 DIAGNOSIS — F411 Generalized anxiety disorder: Secondary | ICD-10-CM | POA: Diagnosis not present

## 2012-05-25 DIAGNOSIS — R112 Nausea with vomiting, unspecified: Secondary | ICD-10-CM | POA: Diagnosis not present

## 2012-05-25 DIAGNOSIS — R109 Unspecified abdominal pain: Secondary | ICD-10-CM | POA: Diagnosis not present

## 2012-05-25 DIAGNOSIS — Z5320 Procedure and treatment not carried out because of patient's decision for unspecified reasons: Secondary | ICD-10-CM | POA: Diagnosis not present

## 2012-05-27 ENCOUNTER — Telehealth: Payer: Self-pay | Admitting: *Deleted

## 2012-05-27 DIAGNOSIS — D649 Anemia, unspecified: Secondary | ICD-10-CM | POA: Diagnosis not present

## 2012-05-27 MED ORDER — AMBULATORY NON FORMULARY MEDICATION: Status: DC

## 2012-05-28 DIAGNOSIS — F339 Major depressive disorder, recurrent, unspecified: Secondary | ICD-10-CM | POA: Diagnosis not present

## 2012-05-29 ENCOUNTER — Emergency Department (HOSPITAL_BASED_OUTPATIENT_CLINIC_OR_DEPARTMENT_OTHER)
Admission: EM | Admit: 2012-05-29 | Discharge: 2012-05-29 | Disposition: A | Discharge status: Home / Self Care | Payer: Medicare Other | Attending: Emergency Medicine | Admitting: Emergency Medicine

## 2012-05-29 ENCOUNTER — Encounter (HOSPITAL_BASED_OUTPATIENT_CLINIC_OR_DEPARTMENT_OTHER): Payer: Self-pay | Admitting: *Deleted

## 2012-05-29 DIAGNOSIS — L299 Pruritus, unspecified: Secondary | ICD-10-CM | POA: Diagnosis not present

## 2012-05-29 DIAGNOSIS — S139XXA Sprain of joints and ligaments of unspecified parts of neck, initial encounter: Secondary | ICD-10-CM | POA: Diagnosis not present

## 2012-05-29 DIAGNOSIS — G4730 Sleep apnea, unspecified: Secondary | ICD-10-CM | POA: Diagnosis not present

## 2012-05-29 DIAGNOSIS — L509 Urticaria, unspecified: Secondary | ICD-10-CM | POA: Insufficient documentation

## 2012-05-29 DIAGNOSIS — E663 Overweight: Secondary | ICD-10-CM | POA: Diagnosis not present

## 2012-05-29 DIAGNOSIS — I10 Essential (primary) hypertension: Secondary | ICD-10-CM | POA: Insufficient documentation

## 2012-05-29 DIAGNOSIS — Z87891 Personal history of nicotine dependence: Secondary | ICD-10-CM | POA: Insufficient documentation

## 2012-05-29 DIAGNOSIS — T7840XA Allergy, unspecified, initial encounter: Secondary | ICD-10-CM | POA: Diagnosis not present

## 2012-05-29 DIAGNOSIS — E785 Hyperlipidemia, unspecified: Secondary | ICD-10-CM | POA: Insufficient documentation

## 2012-05-29 DIAGNOSIS — J45909 Unspecified asthma, uncomplicated: Secondary | ICD-10-CM | POA: Insufficient documentation

## 2012-05-29 DIAGNOSIS — M25519 Pain in unspecified shoulder: Secondary | ICD-10-CM | POA: Diagnosis not present

## 2012-05-29 DIAGNOSIS — M542 Cervicalgia: Secondary | ICD-10-CM | POA: Diagnosis not present

## 2012-05-29 DIAGNOSIS — R21 Rash and other nonspecific skin eruption: Secondary | ICD-10-CM | POA: Diagnosis not present

## 2012-05-29 DIAGNOSIS — IMO0001 Reserved for inherently not codable concepts without codable children: Secondary | ICD-10-CM | POA: Diagnosis not present

## 2012-05-29 MED ORDER — HYDROXYZINE HCL 25 MG PO TABS: 25.0000 mg | ORAL_TABLET | Freq: Four times a day (QID) | ORAL | Status: DC

## 2012-05-29 NOTE — ED Notes (Signed)
Pt c/o rash to bil arms and abd x 1 hr
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
## 2012-05-30 DIAGNOSIS — R21 Rash and other nonspecific skin eruption: Secondary | ICD-10-CM | POA: Diagnosis not present

## 2012-05-30 DIAGNOSIS — Z882 Allergy status to sulfonamides status: Secondary | ICD-10-CM | POA: Diagnosis not present

## 2012-05-30 DIAGNOSIS — Z880 Allergy status to penicillin: Secondary | ICD-10-CM | POA: Diagnosis not present

## 2012-05-30 DIAGNOSIS — L500 Allergic urticaria: Secondary | ICD-10-CM | POA: Diagnosis not present

## 2012-05-30 DIAGNOSIS — L299 Pruritus, unspecified: Secondary | ICD-10-CM | POA: Diagnosis not present

## 2012-05-30 DIAGNOSIS — S139XXA Sprain of joints and ligaments of unspecified parts of neck, initial encounter: Secondary | ICD-10-CM | POA: Diagnosis not present

## 2012-05-30 DIAGNOSIS — M889 Osteitis deformans of unspecified bone: Secondary | ICD-10-CM | POA: Diagnosis not present

## 2012-05-30 DIAGNOSIS — T7840XA Allergy, unspecified, initial encounter: Secondary | ICD-10-CM | POA: Diagnosis not present

## 2012-05-30 DIAGNOSIS — I10 Essential (primary) hypertension: Secondary | ICD-10-CM | POA: Diagnosis not present

## 2012-05-30 DIAGNOSIS — Z888 Allergy status to other drugs, medicaments and biological substances status: Secondary | ICD-10-CM | POA: Diagnosis not present

## 2012-05-30 DIAGNOSIS — Z881 Allergy status to other antibiotic agents status: Secondary | ICD-10-CM | POA: Diagnosis not present

## 2012-05-30 DIAGNOSIS — Z79899 Other long term (current) drug therapy: Secondary | ICD-10-CM | POA: Diagnosis not present

## 2012-05-30 DIAGNOSIS — J45909 Unspecified asthma, uncomplicated: Secondary | ICD-10-CM | POA: Diagnosis not present

## 2012-05-30 DIAGNOSIS — L509 Urticaria, unspecified: Secondary | ICD-10-CM | POA: Diagnosis not present

## 2012-05-30 DIAGNOSIS — G894 Chronic pain syndrome: Secondary | ICD-10-CM | POA: Diagnosis not present

## 2012-06-04 DIAGNOSIS — R0789 Other chest pain: Secondary | ICD-10-CM | POA: Diagnosis not present

## 2012-06-04 DIAGNOSIS — R109 Unspecified abdominal pain: Secondary | ICD-10-CM | POA: Diagnosis not present

## 2012-06-04 DIAGNOSIS — IMO0002 Reserved for concepts with insufficient information to code with codable children: Secondary | ICD-10-CM | POA: Diagnosis not present

## 2012-06-04 DIAGNOSIS — E279 Disorder of adrenal gland, unspecified: Secondary | ICD-10-CM | POA: Diagnosis not present

## 2012-06-05 DIAGNOSIS — F339 Major depressive disorder, recurrent, unspecified: Secondary | ICD-10-CM | POA: Diagnosis not present

## 2012-06-06 DIAGNOSIS — I10 Essential (primary) hypertension: Secondary | ICD-10-CM | POA: Diagnosis not present

## 2012-06-06 DIAGNOSIS — Z886 Allergy status to analgesic agent status: Secondary | ICD-10-CM | POA: Diagnosis not present

## 2012-06-06 DIAGNOSIS — Z79899 Other long term (current) drug therapy: Secondary | ICD-10-CM | POA: Diagnosis not present

## 2012-06-06 DIAGNOSIS — R070 Pain in throat: Secondary | ICD-10-CM | POA: Diagnosis not present

## 2012-06-06 DIAGNOSIS — Z882 Allergy status to sulfonamides status: Secondary | ICD-10-CM | POA: Diagnosis not present

## 2012-06-06 DIAGNOSIS — J029 Acute pharyngitis, unspecified: Secondary | ICD-10-CM | POA: Diagnosis not present

## 2012-06-06 DIAGNOSIS — Z881 Allergy status to other antibiotic agents status: Secondary | ICD-10-CM | POA: Diagnosis not present

## 2012-06-06 DIAGNOSIS — Z880 Allergy status to penicillin: Secondary | ICD-10-CM | POA: Diagnosis not present

## 2012-06-06 DIAGNOSIS — Z888 Allergy status to other drugs, medicaments and biological substances status: Secondary | ICD-10-CM | POA: Diagnosis not present

## 2012-06-06 DIAGNOSIS — C4499 Other specified malignant neoplasm of skin, unspecified: Secondary | ICD-10-CM | POA: Diagnosis not present

## 2012-06-10 DIAGNOSIS — F339 Major depressive disorder, recurrent, unspecified: Secondary | ICD-10-CM | POA: Diagnosis not present

## 2012-06-10 DIAGNOSIS — Z09 Encounter for follow-up examination after completed treatment for conditions other than malignant neoplasm: Secondary | ICD-10-CM | POA: Diagnosis not present

## 2012-06-10 DIAGNOSIS — F458 Other somatoform disorders: Secondary | ICD-10-CM | POA: Diagnosis not present

## 2012-06-11 ENCOUNTER — Encounter: Payer: Self-pay | Admitting: *Deleted

## 2012-06-11 ENCOUNTER — Emergency Department (INDEPENDENT_AMBULATORY_CARE_PROVIDER_SITE_OTHER)
Admission: EM | Admit: 2012-06-11 | Discharge: 2012-06-11 | Disposition: A | Discharge status: Home / Self Care | Payer: Medicare Other | Source: Home / Self Care | Attending: Family Medicine | Admitting: Family Medicine

## 2012-06-11 DIAGNOSIS — IMO0001 Reserved for inherently not codable concepts without codable children: Secondary | ICD-10-CM

## 2012-06-11 DIAGNOSIS — J069 Acute upper respiratory infection, unspecified: Secondary | ICD-10-CM

## 2012-06-11 DIAGNOSIS — M940 Chondrocostal junction syndrome [Tietze]: Secondary | ICD-10-CM

## 2012-06-11 DIAGNOSIS — M7918 Myalgia, other site: Secondary | ICD-10-CM

## 2012-06-11 NOTE — ED Notes (Signed)
Pt c/o bloody nasal drainage x 1wk. She reports seeing her ENT yesterday, no ABT given. She also c/o burning pain between her shoulder blades, some SOB and some dizziness x last night. Denies fever.
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
## 2012-06-13 ENCOUNTER — Ambulatory Visit: Payer: Medicare Other | Admitting: Sports Medicine

## 2012-06-13 DIAGNOSIS — K219 Gastro-esophageal reflux disease without esophagitis: Secondary | ICD-10-CM | POA: Diagnosis not present

## 2012-06-13 DIAGNOSIS — R143 Flatulence: Secondary | ICD-10-CM | POA: Diagnosis not present

## 2012-06-13 DIAGNOSIS — R1310 Dysphagia, unspecified: Secondary | ICD-10-CM | POA: Diagnosis not present

## 2012-06-13 DIAGNOSIS — K589 Irritable bowel syndrome without diarrhea: Secondary | ICD-10-CM | POA: Diagnosis not present

## 2012-06-13 DIAGNOSIS — R141 Gas pain: Secondary | ICD-10-CM | POA: Diagnosis not present

## 2012-06-14 DIAGNOSIS — R0789 Other chest pain: Secondary | ICD-10-CM | POA: Diagnosis not present

## 2012-06-14 DIAGNOSIS — M549 Dorsalgia, unspecified: Secondary | ICD-10-CM | POA: Diagnosis not present

## 2012-06-14 DIAGNOSIS — M25549 Pain in joints of unspecified hand: Secondary | ICD-10-CM | POA: Diagnosis not present

## 2012-06-14 DIAGNOSIS — R0602 Shortness of breath: Secondary | ICD-10-CM | POA: Diagnosis not present

## 2012-06-16 DIAGNOSIS — Z79899 Other long term (current) drug therapy: Secondary | ICD-10-CM | POA: Diagnosis not present

## 2012-06-16 DIAGNOSIS — S199XXA Unspecified injury of neck, initial encounter: Secondary | ICD-10-CM | POA: Diagnosis not present

## 2012-06-16 DIAGNOSIS — Z8719 Personal history of other diseases of the digestive system: Secondary | ICD-10-CM | POA: Diagnosis not present

## 2012-06-16 DIAGNOSIS — S298XXA Other specified injuries of thorax, initial encounter: Secondary | ICD-10-CM | POA: Diagnosis not present

## 2012-06-16 DIAGNOSIS — M542 Cervicalgia: Secondary | ICD-10-CM | POA: Diagnosis not present

## 2012-06-16 DIAGNOSIS — R52 Pain, unspecified: Secondary | ICD-10-CM | POA: Diagnosis not present

## 2012-06-16 DIAGNOSIS — Z880 Allergy status to penicillin: Secondary | ICD-10-CM | POA: Diagnosis not present

## 2012-06-16 DIAGNOSIS — Z9851 Tubal ligation status: Secondary | ICD-10-CM | POA: Diagnosis not present

## 2012-06-16 DIAGNOSIS — I10 Essential (primary) hypertension: Secondary | ICD-10-CM | POA: Diagnosis not present

## 2012-06-16 DIAGNOSIS — Z888 Allergy status to other drugs, medicaments and biological substances status: Secondary | ICD-10-CM | POA: Diagnosis not present

## 2012-06-16 DIAGNOSIS — Z9089 Acquired absence of other organs: Secondary | ICD-10-CM | POA: Diagnosis not present

## 2012-06-16 DIAGNOSIS — S0993XA Unspecified injury of face, initial encounter: Secondary | ICD-10-CM | POA: Diagnosis not present

## 2012-06-16 DIAGNOSIS — Z881 Allergy status to other antibiotic agents status: Secondary | ICD-10-CM | POA: Diagnosis not present

## 2012-06-16 DIAGNOSIS — S239XXA Sprain of unspecified parts of thorax, initial encounter: Secondary | ICD-10-CM | POA: Diagnosis not present

## 2012-06-16 DIAGNOSIS — Z862 Personal history of diseases of the blood and blood-forming organs and certain disorders involving the immune mechanism: Secondary | ICD-10-CM | POA: Diagnosis not present

## 2012-06-16 DIAGNOSIS — M889 Osteitis deformans of unspecified bone: Secondary | ICD-10-CM | POA: Diagnosis not present

## 2012-06-16 DIAGNOSIS — Z882 Allergy status to sulfonamides status: Secondary | ICD-10-CM | POA: Diagnosis not present

## 2012-06-16 DIAGNOSIS — J45909 Unspecified asthma, uncomplicated: Secondary | ICD-10-CM | POA: Diagnosis not present

## 2012-06-16 DIAGNOSIS — M549 Dorsalgia, unspecified: Secondary | ICD-10-CM | POA: Diagnosis not present

## 2012-06-17 DIAGNOSIS — I10 Essential (primary) hypertension: Secondary | ICD-10-CM | POA: Diagnosis not present

## 2012-06-17 DIAGNOSIS — I498 Other specified cardiac arrhythmias: Secondary | ICD-10-CM | POA: Diagnosis not present

## 2012-06-17 DIAGNOSIS — R079 Chest pain, unspecified: Secondary | ICD-10-CM | POA: Diagnosis not present

## 2012-06-18 ENCOUNTER — Encounter: Payer: Self-pay | Admitting: Family Medicine

## 2012-06-18 ENCOUNTER — Ambulatory Visit (INDEPENDENT_AMBULATORY_CARE_PROVIDER_SITE_OTHER): Payer: Medicare Other | Admitting: Family Medicine

## 2012-06-18 VITALS — BP 126/84 | HR 99 | Wt 180.0 lb

## 2012-06-18 DIAGNOSIS — F339 Major depressive disorder, recurrent, unspecified: Secondary | ICD-10-CM | POA: Diagnosis not present

## 2012-06-18 DIAGNOSIS — M2550 Pain in unspecified joint: Secondary | ICD-10-CM | POA: Diagnosis not present

## 2012-06-18 DIAGNOSIS — R591 Generalized enlarged lymph nodes: Secondary | ICD-10-CM

## 2012-06-18 DIAGNOSIS — IMO0001 Reserved for inherently not codable concepts without codable children: Secondary | ICD-10-CM

## 2012-06-18 DIAGNOSIS — F411 Generalized anxiety disorder: Secondary | ICD-10-CM | POA: Diagnosis not present

## 2012-06-18 DIAGNOSIS — R599 Enlarged lymph nodes, unspecified: Secondary | ICD-10-CM | POA: Diagnosis not present

## 2012-06-18 DIAGNOSIS — M791 Myalgia, unspecified site: Secondary | ICD-10-CM

## 2012-06-18 NOTE — Progress Notes (Signed)
Subjective:    Patient ID: ADRIENE KNIPFER, female    DOB: January 31, 1966, 46 y.o.   MRN: [id number]  HPI She has been having chest pain and neck pain. Got worse with with traction during PT. Saw Dr  Demetrius Charity for her shoulder who recommend TENs unit and PT.  she did start using TENS unit but started noticing some itching around her ears and later the next day started to have a full-blown rash. She did go to the emergency room. She wasn't sure exactly what started it. She denied any other new medications creams lotions etc. The next day she tried taking Zyrtec thinking that that would work just as well as the Benadryl and says that the rash actually works and actually had large hives. The she went back to the emergency room. She would like me Zyrtec to her allergy list. Her biggest complaint today is that she's aching in her upper chest and over her spine between her shoulder blades. She was actually having a very sharp sensation across her upper spine. Says aching into her arms and her back of her legs.  Had normal cardiac enzymes.  Still having sig pain in her neck.  She says she wonders if swollen lymph nodes could be causing her diffuse pain. She is concerned because once and she went to the urgent care she was told that she had some mild lymphadenopathy in her neck.   Review of Systems     Objective:   Physical Exam  Constitutional: She is oriented to person, place, and time. She appears well-developed and well-nourished.  HENT:  Head: Normocephalic and atraumatic.  Right Ear: External ear normal.  Left Ear: External ear normal.  Nose: Nose normal.  Mouth/Throat: Oropharynx is clear and moist.       TMs and canals are clear.   Eyes: Conjunctivae normal and EOM are normal. Pupils are equal, round, and reactive to light.  Neck: Neck supple. No thyromegaly present.  Cardiovascular: Normal rate, regular rhythm and normal heart sounds.   Pulmonary/Chest: Effort normal and breath sounds normal. She  has no wheezes.  Musculoskeletal: She exhibits no edema.       She has multiple tender points over both upper trapezius, her scapula, her cervical and thoracic spine, her upper chest, her upper and lower forearms, and her calves bilaterally. No tenderness over the thighs.  Lymphadenopathy:    She has no cervical adenopathy.  Neurological: She is alert and oriented to person, place, and time.  Skin: Skin is warm and dry.  Psychiatric: She has a normal mood and affect.          Assessment & Plan:  Myalgia-I. strongly suspect that she has fibromyalgia. She has multiple tender points on her upper and lower body as well as on both sides of her body. She has a long history of recurrent chest pain that has been worked up multiple times and is negative for cardiac disease. She does have a family history of cardiac disease. She's not good candidate for medical treatment for possible fibromyalgia. I did recommend not laying in bed and getting more regular exercise. Studies have shown that this does help with pain scores. We will check some blood work today to make sure that her CK is normal. We will also check a sedimentation rate and ANA et Karie Soda. She has had some rheumatologic workups in the past but it has been quite some time. We will check a CBC with peripheral smear but try to  give her reassurance that I do not think generalized lymphadenopathy is causing her symptoms. She is concerned because sometimes her lymph nodes to swell but they typically do go back down as well.  She is very concerned about swollen lymph nodes all over her body. I did not palpate any swollen lymph nodes in her neck today.  Allergic reaction-unclear what the cause was. She's suspicious of the glue in the leads for the TENS unit. I'm not convinced that that's exactly what it was. I did go and asked her to return: Sure since it seemed to aggravate her allergic reaction.

## 2012-06-19 ENCOUNTER — Encounter: Payer: Self-pay | Admitting: Family Medicine

## 2012-06-19 LAB — ANA: Anti Nuclear Antibody(ANA): NEGATIVE

## 2012-06-19 LAB — CK: Total CK: 22 U/L (ref 7–177)

## 2012-06-19 LAB — CBC WITH DIFFERENTIAL/PLATELET
Basophils Absolute: 0.1 10*3/uL (ref 0.0–0.1)
Basophils Relative: 1 % (ref 0–1)
Eosinophils Absolute: 0.2 10*3/uL (ref 0.0–0.7)
Eosinophils Relative: 3 % (ref 0–5)
HCT: 38.9 % (ref 36.0–46.0)
Hemoglobin: 13.3 g/dL (ref 12.0–15.0)
Lymphocytes Relative: 23 % (ref 12–46)
Lymphs Abs: 1.5 10*3/uL (ref 0.7–4.0)
MCH: 29.3 pg (ref 26.0–34.0)
MCHC: 34.2 g/dL (ref 30.0–36.0)
MCV: 85.7 fL (ref 78.0–100.0)
Monocytes Absolute: 0.7 10*3/uL (ref 0.1–1.0)
Monocytes Relative: 10 % (ref 3–12)
Neutro Abs: 4.3 10*3/uL (ref 1.7–7.7)
Neutrophils Relative %: 63 % (ref 43–77)
Platelets: 229 10*3/uL (ref 150–400)
RBC: 4.54 MIL/uL (ref 3.87–5.11)
RDW: 14.4 % (ref 11.5–15.5)
WBC: 6.7 10*3/uL (ref 4.0–10.5)

## 2012-06-19 LAB — PATHOLOGIST SMEAR REVIEW

## 2012-06-19 LAB — RHEUMATOID FACTOR: Rhuematoid fact SerPl-aCnc: <10 IU/mL (ref <=14)

## 2012-06-19 LAB — SEDIMENTATION RATE: Sed Rate: 4 mm/hr (ref 0–22)

## 2012-06-19 IMAGING — CR DG ABDOMEN ACUTE W/ 1V CHEST
3 series · 3 of 3 positions shown · non-contrast
Comparison: 09/06/2010

CLINICAL DATA: Gas and right-sided abdominal pain

ACUTE ABDOMEN SERIES (ABDOMEN 2 VIEW & CHEST 1 VIEW)

[w chest pa]
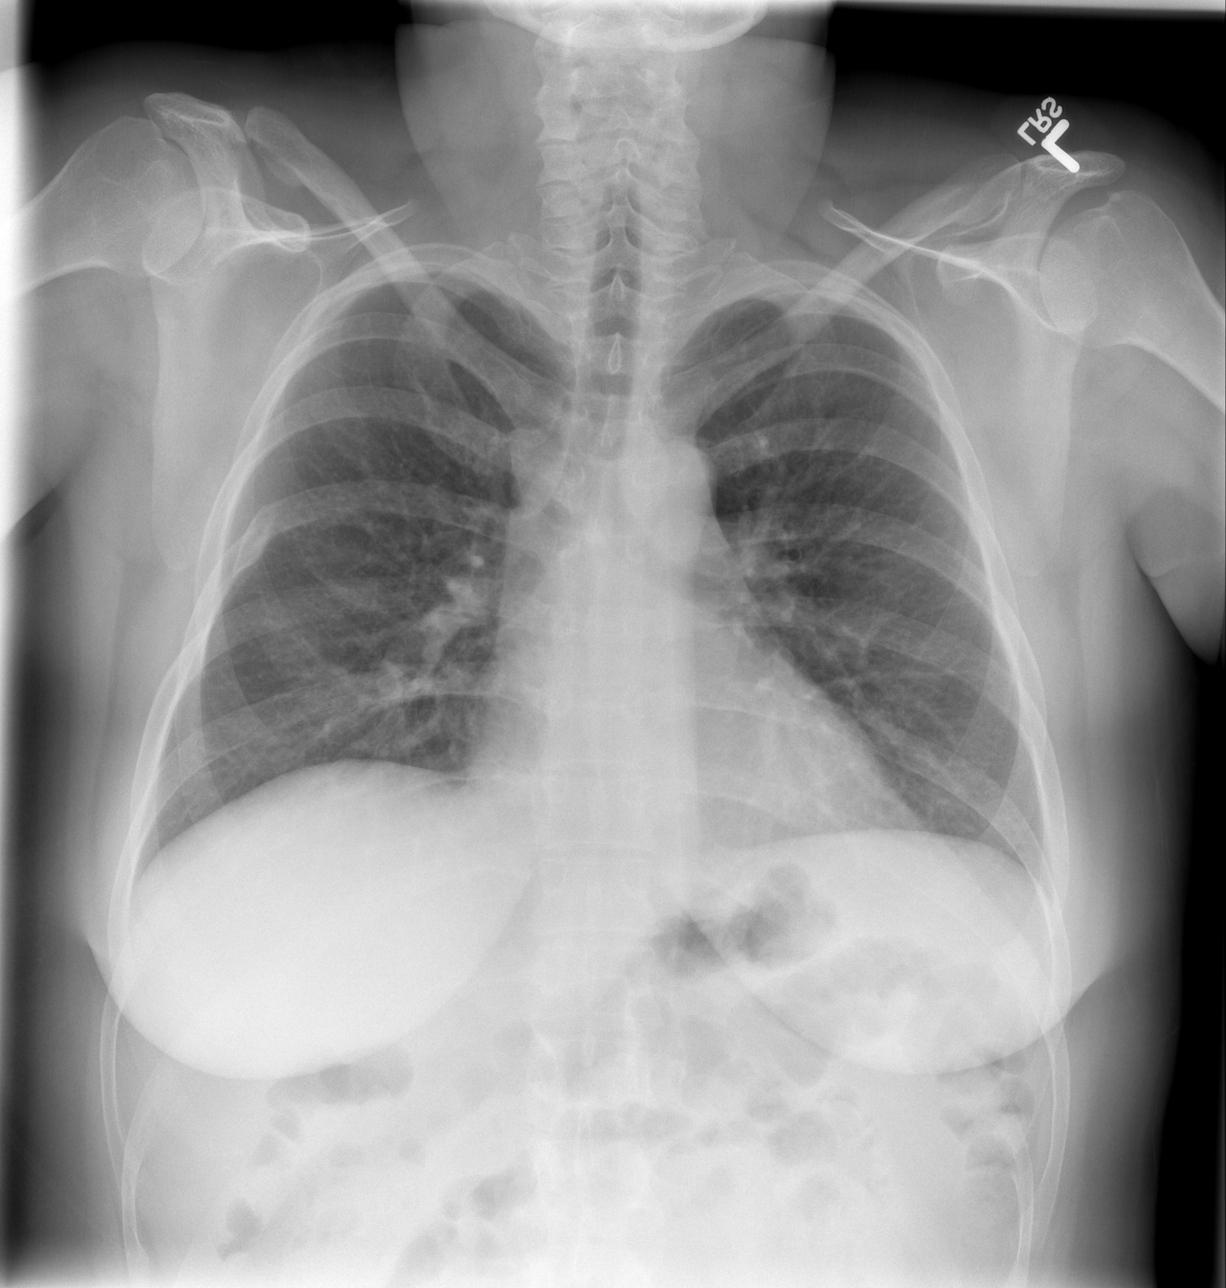

[w abdomen upright]
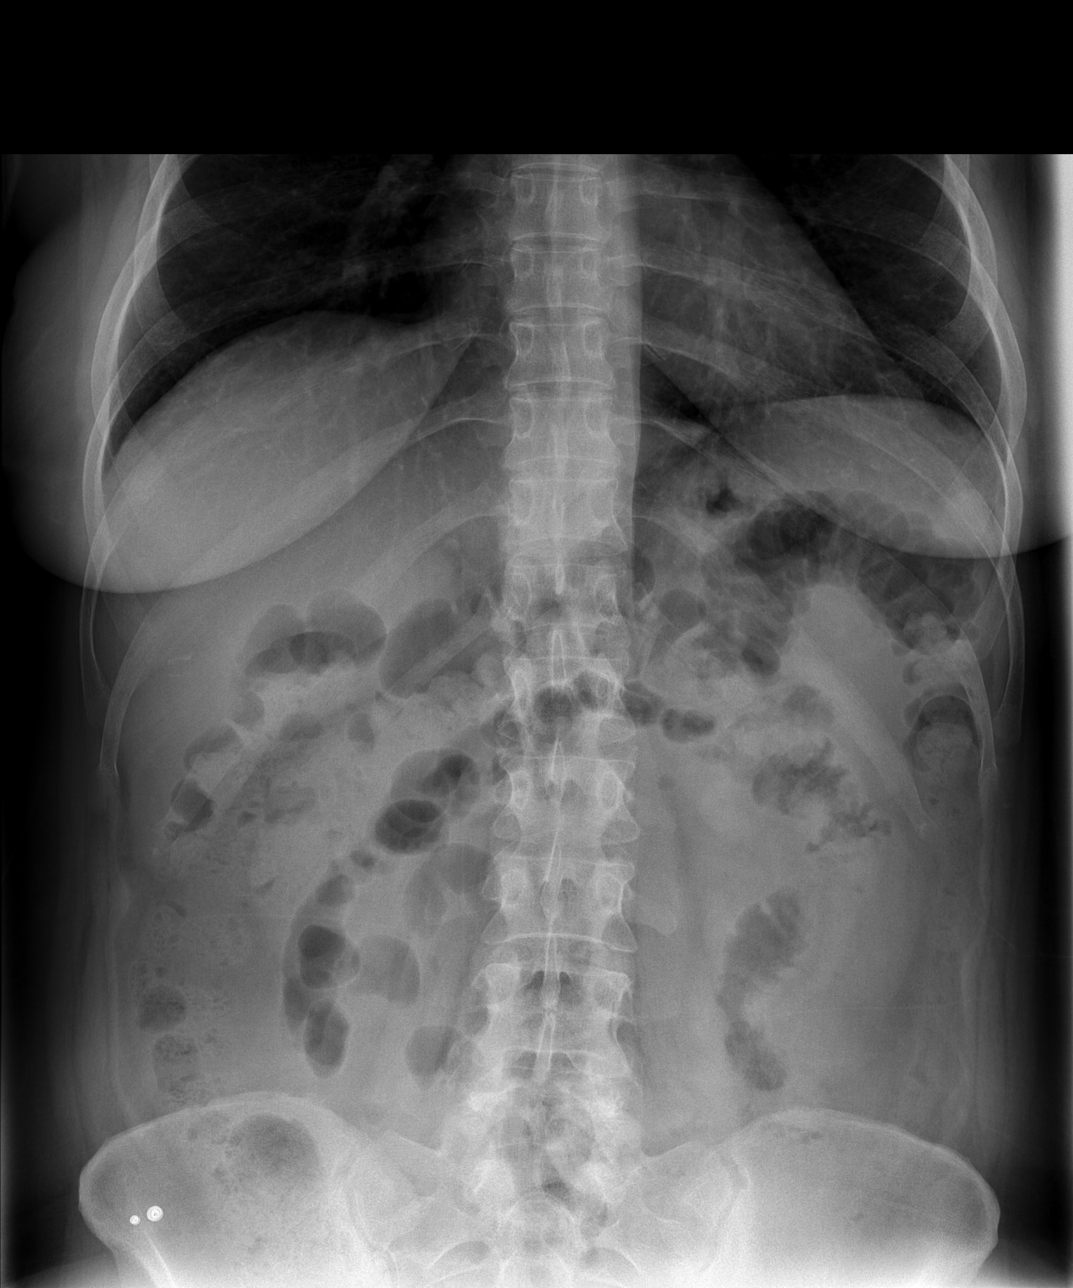

[t abdomen supine]
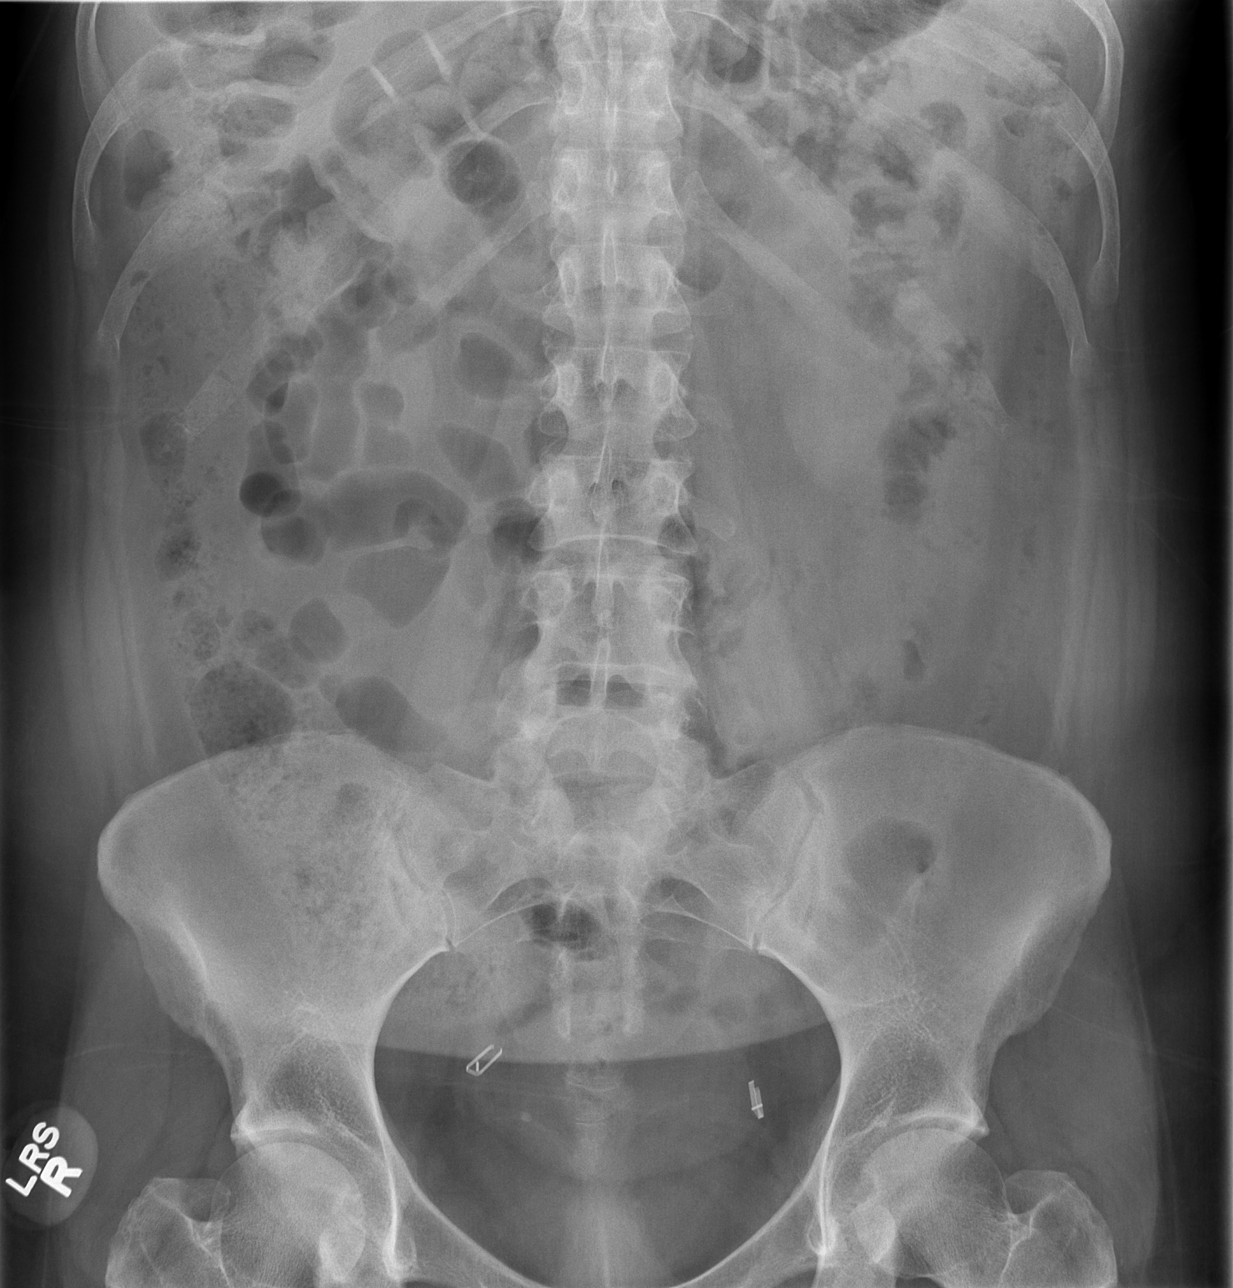

[3 of 3 positions shown; findings below may reference images not displayed]

FINDINGS: Shallow inspiration.  Old right rib fracture. Normal
heart size and pulmonary vascularity.  No focal consolidation in
the lungs.  No blunting of costophrenic angles.

Scattered gas and stool in the colon.  No small or large bowel
dilatation.  No free intra-abdominal air.  No abnormal air fluid
levels.  No radiopaque stones.  Surgical clips in the pelvis.
IMPRESSION: No evidence of active pulmonary disease.  Nonobstructive bowel gas
pattern.

## 2012-06-20 DIAGNOSIS — K589 Irritable bowel syndrome without diarrhea: Secondary | ICD-10-CM | POA: Diagnosis not present

## 2012-06-20 DIAGNOSIS — R141 Gas pain: Secondary | ICD-10-CM | POA: Diagnosis not present

## 2012-06-20 DIAGNOSIS — R143 Flatulence: Secondary | ICD-10-CM | POA: Diagnosis not present

## 2012-06-20 DIAGNOSIS — F339 Major depressive disorder, recurrent, unspecified: Secondary | ICD-10-CM | POA: Diagnosis not present

## 2012-06-24 ENCOUNTER — Emergency Department (HOSPITAL_BASED_OUTPATIENT_CLINIC_OR_DEPARTMENT_OTHER)
Admission: EM | Admit: 2012-06-24 | Discharge: 2012-06-24 | Disposition: A | Discharge status: Home / Self Care | Payer: Medicare Other | Attending: Emergency Medicine | Admitting: Emergency Medicine

## 2012-06-24 DIAGNOSIS — Z87891 Personal history of nicotine dependence: Secondary | ICD-10-CM | POA: Insufficient documentation

## 2012-06-24 DIAGNOSIS — J45909 Unspecified asthma, uncomplicated: Secondary | ICD-10-CM | POA: Diagnosis not present

## 2012-06-24 DIAGNOSIS — T5991XA Toxic effect of unspecified gases, fumes and vapors, accidental (unintentional), initial encounter: Secondary | ICD-10-CM

## 2012-06-24 DIAGNOSIS — J45901 Unspecified asthma with (acute) exacerbation: Secondary | ICD-10-CM | POA: Diagnosis not present

## 2012-06-24 DIAGNOSIS — Z9889 Other specified postprocedural states: Secondary | ICD-10-CM | POA: Diagnosis not present

## 2012-06-24 DIAGNOSIS — E785 Hyperlipidemia, unspecified: Secondary | ICD-10-CM | POA: Insufficient documentation

## 2012-06-24 DIAGNOSIS — R059 Cough, unspecified: Secondary | ICD-10-CM | POA: Diagnosis not present

## 2012-06-24 DIAGNOSIS — D649 Anemia, unspecified: Secondary | ICD-10-CM | POA: Insufficient documentation

## 2012-06-24 DIAGNOSIS — G894 Chronic pain syndrome: Secondary | ICD-10-CM | POA: Diagnosis not present

## 2012-06-24 DIAGNOSIS — Y92009 Unspecified place in unspecified non-institutional (private) residence as the place of occurrence of the external cause: Secondary | ICD-10-CM | POA: Insufficient documentation

## 2012-06-24 DIAGNOSIS — K219 Gastro-esophageal reflux disease without esophagitis: Secondary | ICD-10-CM | POA: Insufficient documentation

## 2012-06-24 DIAGNOSIS — Y93G3 Activity, cooking and baking: Secondary | ICD-10-CM | POA: Insufficient documentation

## 2012-06-24 DIAGNOSIS — I10 Essential (primary) hypertension: Secondary | ICD-10-CM | POA: Insufficient documentation

## 2012-06-24 DIAGNOSIS — G4730 Sleep apnea, unspecified: Secondary | ICD-10-CM | POA: Diagnosis not present

## 2012-06-24 DIAGNOSIS — R0609 Other forms of dyspnea: Secondary | ICD-10-CM | POA: Diagnosis not present

## 2012-06-24 DIAGNOSIS — J309 Allergic rhinitis, unspecified: Secondary | ICD-10-CM | POA: Diagnosis not present

## 2012-06-24 DIAGNOSIS — R05 Cough: Secondary | ICD-10-CM | POA: Diagnosis not present

## 2012-06-24 DIAGNOSIS — Z79899 Other long term (current) drug therapy: Secondary | ICD-10-CM | POA: Diagnosis not present

## 2012-06-24 DIAGNOSIS — X001XXA Exposure to smoke in uncontrolled fire in building or structure, initial encounter: Secondary | ICD-10-CM | POA: Insufficient documentation

## 2012-06-24 DIAGNOSIS — R42 Dizziness and giddiness: Secondary | ICD-10-CM | POA: Diagnosis not present

## 2012-06-24 DIAGNOSIS — R0989 Other specified symptoms and signs involving the circulatory and respiratory systems: Secondary | ICD-10-CM | POA: Diagnosis not present

## 2012-06-24 DIAGNOSIS — F4310 Post-traumatic stress disorder, unspecified: Secondary | ICD-10-CM | POA: Insufficient documentation
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
## 2012-06-27 DIAGNOSIS — Z79899 Other long term (current) drug therapy: Secondary | ICD-10-CM | POA: Diagnosis not present

## 2012-06-27 DIAGNOSIS — I10 Essential (primary) hypertension: Secondary | ICD-10-CM | POA: Diagnosis not present

## 2012-06-27 DIAGNOSIS — E876 Hypokalemia: Secondary | ICD-10-CM | POA: Diagnosis not present

## 2012-06-27 DIAGNOSIS — R630 Anorexia: Secondary | ICD-10-CM | POA: Diagnosis not present

## 2012-06-27 DIAGNOSIS — F339 Major depressive disorder, recurrent, unspecified: Secondary | ICD-10-CM | POA: Diagnosis not present

## 2012-07-02 ENCOUNTER — Ambulatory Visit (INDEPENDENT_AMBULATORY_CARE_PROVIDER_SITE_OTHER): Payer: Medicare Other | Admitting: Family Medicine

## 2012-07-02 ENCOUNTER — Telehealth: Payer: Self-pay | Admitting: *Deleted

## 2012-07-02 ENCOUNTER — Encounter: Payer: Self-pay | Admitting: Family Medicine

## 2012-07-02 VITALS — BP 157/92 | HR 96 | Wt 180.0 lb

## 2012-07-02 DIAGNOSIS — R0609 Other forms of dyspnea: Secondary | ICD-10-CM | POA: Diagnosis not present

## 2012-07-02 DIAGNOSIS — Z79899 Other long term (current) drug therapy: Secondary | ICD-10-CM | POA: Diagnosis not present

## 2012-07-02 DIAGNOSIS — G4700 Insomnia, unspecified: Secondary | ICD-10-CM

## 2012-07-02 DIAGNOSIS — R0989 Other specified symptoms and signs involving the circulatory and respiratory systems: Secondary | ICD-10-CM

## 2012-07-02 DIAGNOSIS — R0600 Dyspnea, unspecified: Secondary | ICD-10-CM

## 2012-07-02 DIAGNOSIS — IMO0001 Reserved for inherently not codable concepts without codable children: Secondary | ICD-10-CM | POA: Diagnosis not present

## 2012-07-02 DIAGNOSIS — I10 Essential (primary) hypertension: Secondary | ICD-10-CM | POA: Diagnosis not present

## 2012-07-02 DIAGNOSIS — R110 Nausea: Secondary | ICD-10-CM | POA: Diagnosis not present

## 2012-07-02 DIAGNOSIS — Z888 Allergy status to other drugs, medicaments and biological substances status: Secondary | ICD-10-CM | POA: Diagnosis not present

## 2012-07-02 DIAGNOSIS — F411 Generalized anxiety disorder: Secondary | ICD-10-CM | POA: Diagnosis not present

## 2012-07-02 DIAGNOSIS — M797 Fibromyalgia: Secondary | ICD-10-CM

## 2012-07-02 MED ORDER — TELMISARTAN 40 MG PO TABS: 40.0000 mg | ORAL_TABLET | Freq: Every day | ORAL | Status: DC

## 2012-07-02 MED ORDER — CYCLOBENZAPRINE HCL 10 MG PO TABS: 5.0000 mg | ORAL_TABLET | Freq: Every evening | ORAL | Status: DC | PRN

## 2012-07-02 NOTE — Progress Notes (Signed)
  Subjective:    Patient ID: Jennifer [name], female    DOB: November 14, 1965, 46 y.o.   MRN: [id number]  HPI She has not been sleeping well for several weeks. Says her mouth has been dry.  Says she is taking her micardis.  Says after 3 nights in a row she feels horrible.  Says her BP is up again.  She feels very fatigued.  Says feels like might pass out when gets out of the shower. She says she has episodes where she feels like she can actually get out of the house and try to do things such as going to the mountains or going shopping but then she gets so tired and so short of breath and so achy all over that she will only have to stay in bed for several days. This is a repeating cycle that she goes 3. She has not been sleeping well the last several nights a particularly. Last night she did not fall sleep until 1:00 in the morning. She will sometimes wake up short of breath with palpitations. She did have a overnight sleep study within the last year that was not significant for treatable sleep apnea.  She's also worried she may be iron deficient again since she's been very tired recently. She does have a blood draw scheduled for later this week with them. And has a followup in about 2 weeks.   Review of Systems     Objective:   Physical Exam  Constitutional: She appears well-developed and well-nourished.  Skin: Skin is warm and dry.  Psychiatric: She has a normal mood and affect. Her behavior is normal.          Assessment & Plan:   Insomnia- Start 5-10 mg at bedtime prn. I think this will help with his muscle pain and aches as well as sleep. I discussed that she can certainly order the tab if need be. They are very small but I do have patients are able to quit his habit actually works very well for them. I did reassure her about the short half-life on the drug I do think it's reasonable for her to try this.  Fibromyalgia - I really strongly feel that she does have fibromyalgia. In fact we tried  to mail a handout to her house but she never received it. I printed out for her today and asked to read the first paragraph here in the office. After reading it she burst into tears. She says she felt like that the described a lot of her symptoms. I asked her to recall handout between now and when I see her back in 2 weeks so that we can discuss it further. Really think this describes the cycles that she goes through her she has a little bit more energy and is able to do things but then will be in bed and lying around for several days afterwards. Feeling worse.  Hypertension-I. encouraged her to continue with her Micardis. I did send her for refill today. She can also consider going up to a whole tablet 40 mg per she's only been taking a half. Her blood pressures up today but she was also late for her appointment. The she does report home blood pressures have been high as well. I also explained her that it sometimes takes a couple weeks for the medication to reach maximal effectiveness. At this point she's only been taking it for about 3 days consistently.

## 2012-07-02 NOTE — Telephone Encounter (Signed)
Talked with Dr. Linford Arnold and with patient she can take the other 1/2 of B/P med  Patient states b/p is normal lying down,  Per Dr. Linford Arnold increase fluids and recheck b/p again later.

## 2012-07-02 NOTE — Telephone Encounter (Signed)
Patient called and states she took 1/2 tablet of her B/P med and her B/P is 156/106 -she took it at a machine that wraps around her arm at the Other appointment she went to today.

## 2012-07-03 DIAGNOSIS — D649 Anemia, unspecified: Secondary | ICD-10-CM | POA: Diagnosis not present

## 2012-07-03 DIAGNOSIS — I10 Essential (primary) hypertension: Secondary | ICD-10-CM | POA: Diagnosis not present

## 2012-07-03 DIAGNOSIS — E269 Hyperaldosteronism, unspecified: Secondary | ICD-10-CM | POA: Diagnosis not present

## 2012-07-03 DIAGNOSIS — F411 Generalized anxiety disorder: Secondary | ICD-10-CM | POA: Diagnosis not present

## 2012-07-04 DIAGNOSIS — D649 Anemia, unspecified: Secondary | ICD-10-CM | POA: Diagnosis not present

## 2012-07-04 DIAGNOSIS — I6529 Occlusion and stenosis of unspecified carotid artery: Secondary | ICD-10-CM | POA: Diagnosis not present

## 2012-07-04 DIAGNOSIS — Z882 Allergy status to sulfonamides status: Secondary | ICD-10-CM | POA: Diagnosis not present

## 2012-07-04 DIAGNOSIS — R51 Headache: Secondary | ICD-10-CM | POA: Diagnosis not present

## 2012-07-04 DIAGNOSIS — Z885 Allergy status to narcotic agent status: Secondary | ICD-10-CM | POA: Diagnosis not present

## 2012-07-04 DIAGNOSIS — R5381 Other malaise: Secondary | ICD-10-CM | POA: Diagnosis not present

## 2012-07-04 DIAGNOSIS — R52 Pain, unspecified: Secondary | ICD-10-CM | POA: Diagnosis not present

## 2012-07-04 DIAGNOSIS — G458 Other transient cerebral ischemic attacks and related syndromes: Secondary | ICD-10-CM | POA: Diagnosis not present

## 2012-07-04 DIAGNOSIS — M542 Cervicalgia: Secondary | ICD-10-CM | POA: Diagnosis not present

## 2012-07-04 DIAGNOSIS — R209 Unspecified disturbances of skin sensation: Secondary | ICD-10-CM | POA: Diagnosis not present

## 2012-07-04 DIAGNOSIS — R791 Abnormal coagulation profile: Secondary | ICD-10-CM | POA: Diagnosis not present

## 2012-07-04 DIAGNOSIS — J45909 Unspecified asthma, uncomplicated: Secondary | ICD-10-CM | POA: Diagnosis not present

## 2012-07-04 DIAGNOSIS — I10 Essential (primary) hypertension: Secondary | ICD-10-CM | POA: Diagnosis not present

## 2012-07-04 DIAGNOSIS — R29898 Other symptoms and signs involving the musculoskeletal system: Secondary | ICD-10-CM | POA: Diagnosis not present

## 2012-07-04 DIAGNOSIS — R7309 Other abnormal glucose: Secondary | ICD-10-CM | POA: Diagnosis not present

## 2012-07-04 DIAGNOSIS — M889 Osteitis deformans of unspecified bone: Secondary | ICD-10-CM | POA: Diagnosis not present

## 2012-07-04 DIAGNOSIS — Z888 Allergy status to other drugs, medicaments and biological substances status: Secondary | ICD-10-CM | POA: Diagnosis not present

## 2012-07-04 DIAGNOSIS — Z880 Allergy status to penicillin: Secondary | ICD-10-CM | POA: Diagnosis not present

## 2012-07-04 DIAGNOSIS — G459 Transient cerebral ischemic attack, unspecified: Secondary | ICD-10-CM | POA: Diagnosis not present

## 2012-07-05 DIAGNOSIS — R29898 Other symptoms and signs involving the musculoskeletal system: Secondary | ICD-10-CM | POA: Diagnosis not present

## 2012-07-06 IMAGING — CR DG CHEST 2V
2 series · 2 of 2 positions shown · non-contrast
Comparison: February 24, 2011

CLINICAL DATA: Productive cough, hypertension

CHEST - 2 VIEW

[w chest pa]
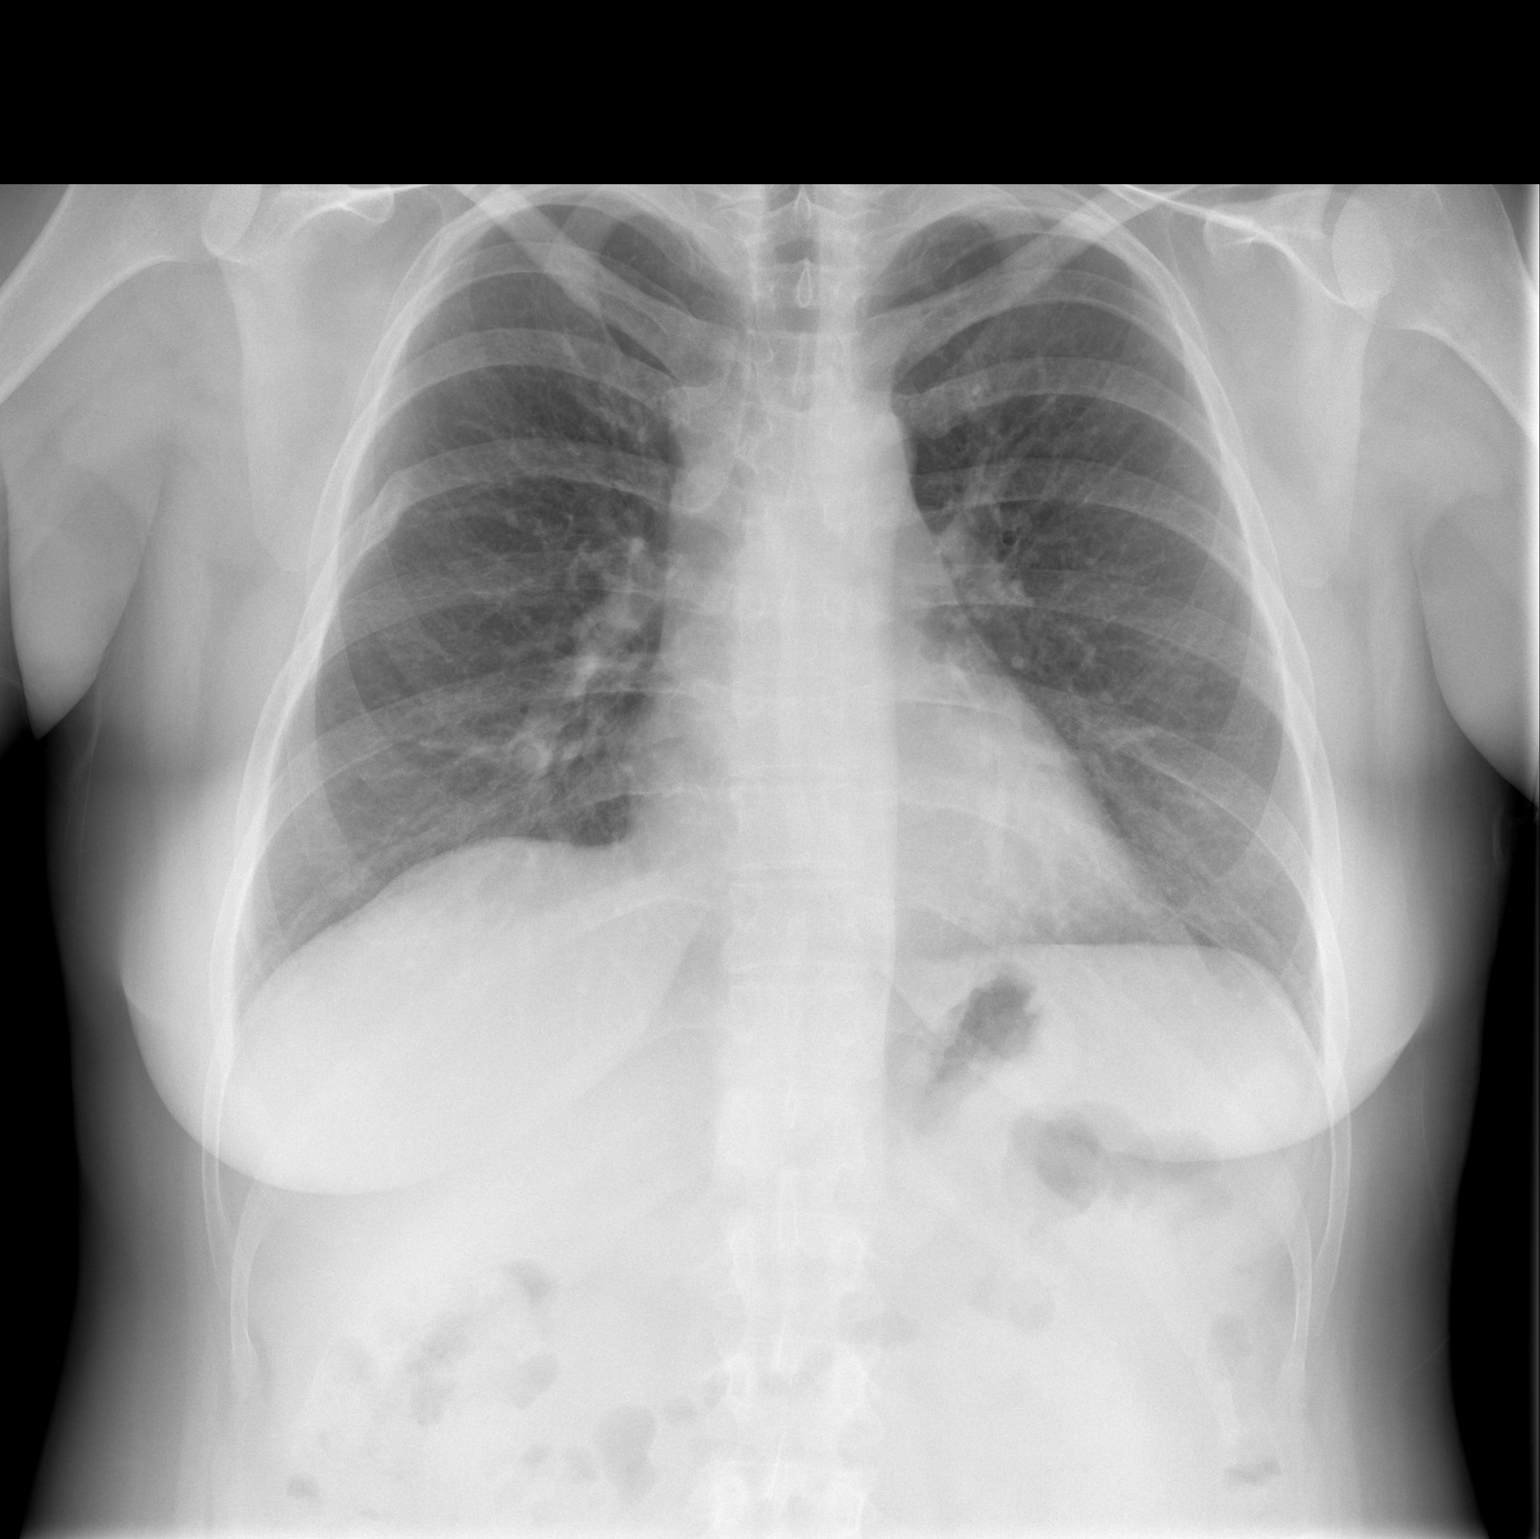

[w chest lat]
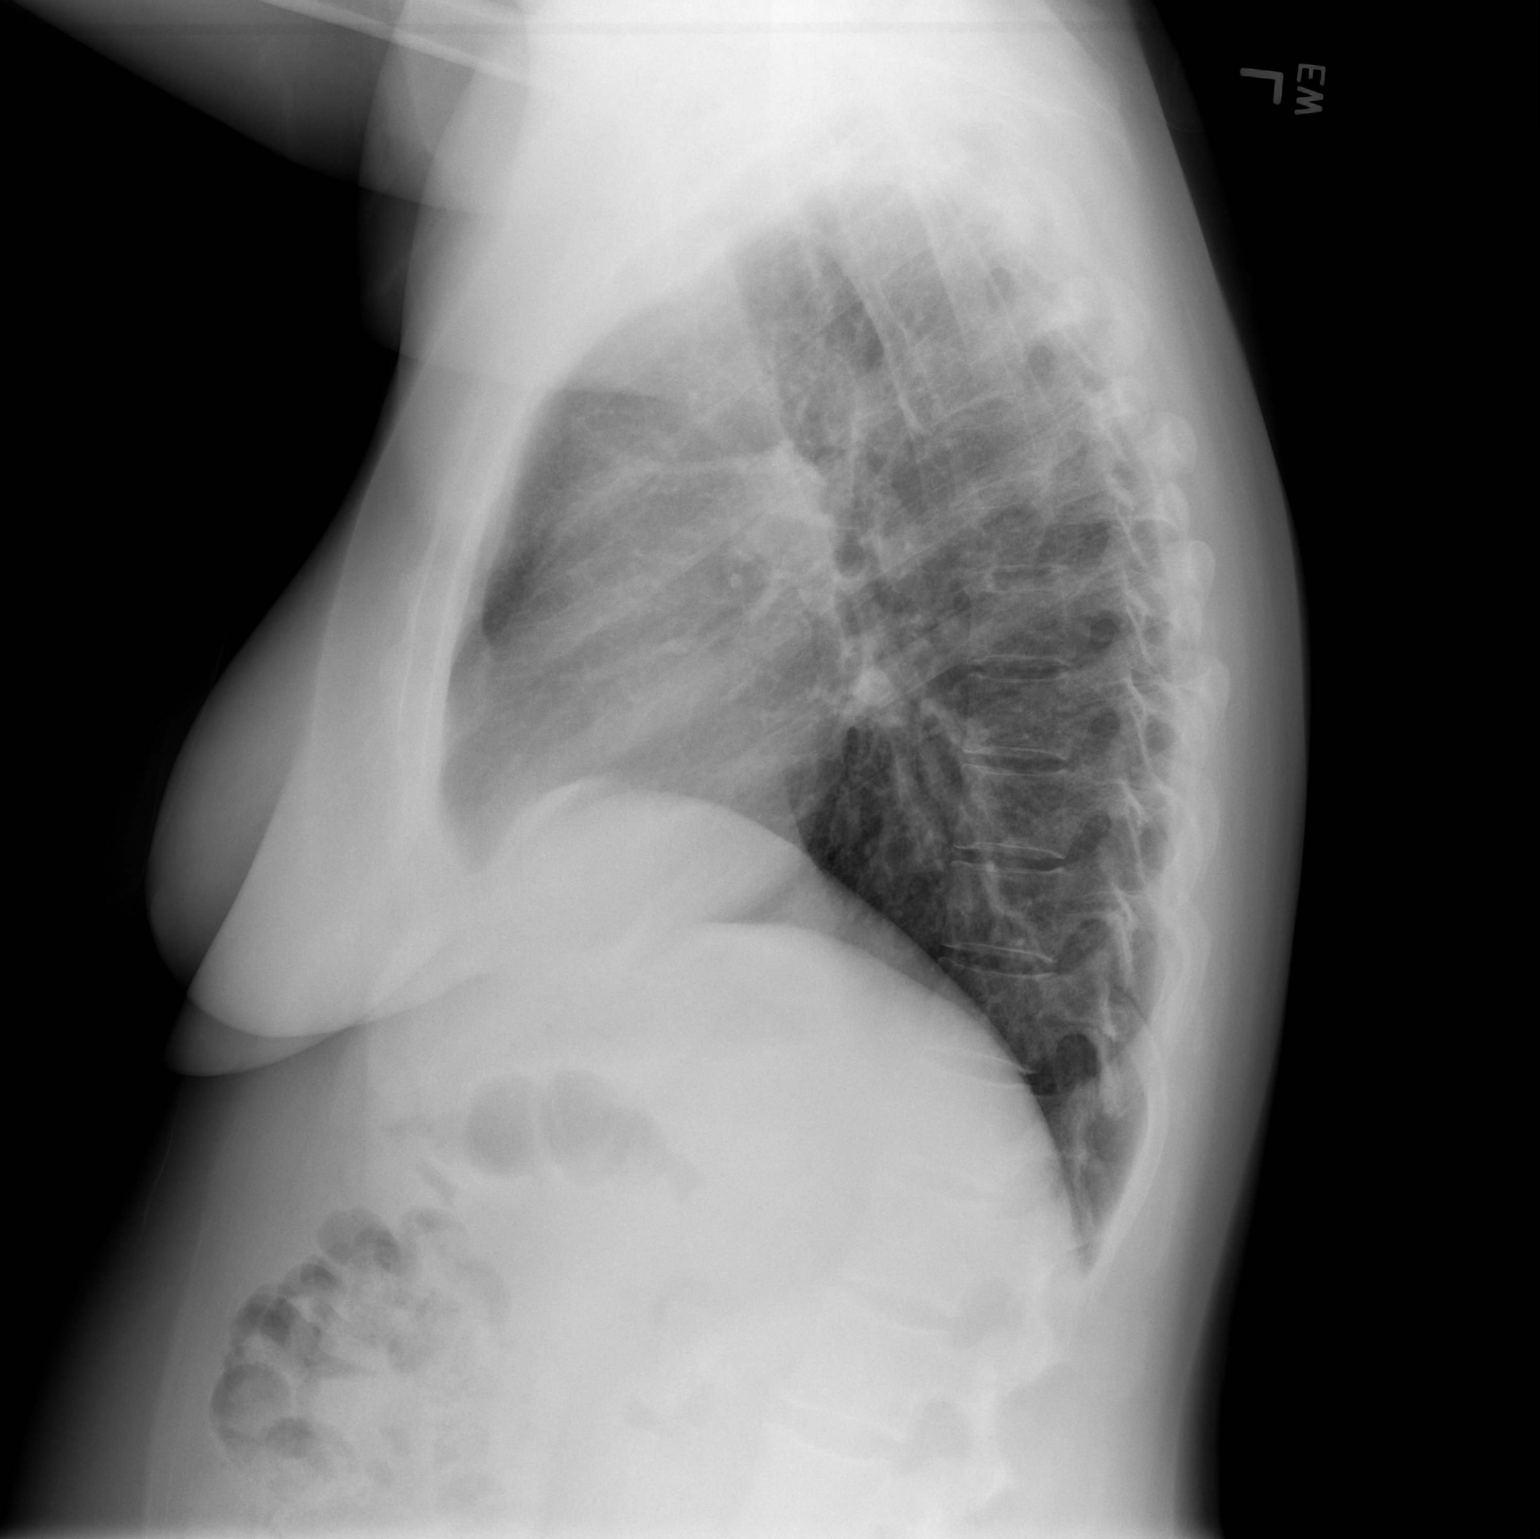

[2 of 2 positions shown; findings below may reference images not displayed]

FINDINGS: The cardiac silhouette, mediastinum, pulmonary
vasculature are within normal limits.  Both lungs are clear.
There is no acute bony abnormality.
IMPRESSION: There is no evidence of acute cardiac or pulmonary process.

## 2012-07-08 DIAGNOSIS — J45909 Unspecified asthma, uncomplicated: Secondary | ICD-10-CM | POA: Diagnosis not present

## 2012-07-08 DIAGNOSIS — IMO0002 Reserved for concepts with insufficient information to code with codable children: Secondary | ICD-10-CM | POA: Diagnosis not present

## 2012-07-08 DIAGNOSIS — I10 Essential (primary) hypertension: Secondary | ICD-10-CM | POA: Diagnosis not present

## 2012-07-08 DIAGNOSIS — T8089XA Other complications following infusion, transfusion and therapeutic injection, initial encounter: Secondary | ICD-10-CM | POA: Diagnosis not present

## 2012-07-08 DIAGNOSIS — S5000XA Contusion of unspecified elbow, initial encounter: Secondary | ICD-10-CM | POA: Diagnosis not present

## 2012-07-08 DIAGNOSIS — D649 Anemia, unspecified: Secondary | ICD-10-CM | POA: Diagnosis not present

## 2012-07-08 DIAGNOSIS — Z79899 Other long term (current) drug therapy: Secondary | ICD-10-CM | POA: Diagnosis not present

## 2012-07-09 ENCOUNTER — Ambulatory Visit (INDEPENDENT_AMBULATORY_CARE_PROVIDER_SITE_OTHER): Payer: Medicare Other | Admitting: Family Medicine

## 2012-07-09 ENCOUNTER — Telehealth: Payer: Self-pay | Admitting: Family Medicine

## 2012-07-09 ENCOUNTER — Encounter: Payer: Self-pay | Admitting: Family Medicine

## 2012-07-09 VITALS — BP 142/84 | HR 106 | Wt 181.0 lb

## 2012-07-09 DIAGNOSIS — M79609 Pain in unspecified limb: Secondary | ICD-10-CM

## 2012-07-09 DIAGNOSIS — R531 Weakness: Secondary | ICD-10-CM

## 2012-07-09 DIAGNOSIS — R5381 Other malaise: Secondary | ICD-10-CM | POA: Diagnosis not present

## 2012-07-09 DIAGNOSIS — F339 Major depressive disorder, recurrent, unspecified: Secondary | ICD-10-CM | POA: Diagnosis not present

## 2012-07-09 DIAGNOSIS — R9089 Other abnormal findings on diagnostic imaging of central nervous system: Secondary | ICD-10-CM

## 2012-07-09 DIAGNOSIS — M79631 Pain in right forearm: Secondary | ICD-10-CM

## 2012-07-09 DIAGNOSIS — E785 Hyperlipidemia, unspecified: Secondary | ICD-10-CM

## 2012-07-09 DIAGNOSIS — R930 Abnormal findings on diagnostic imaging of skull and head, not elsewhere classified: Secondary | ICD-10-CM

## 2012-07-09 DIAGNOSIS — D509 Iron deficiency anemia, unspecified: Secondary | ICD-10-CM

## 2012-07-09 DIAGNOSIS — D320 Benign neoplasm of cerebral meninges: Secondary | ICD-10-CM

## 2012-07-09 DIAGNOSIS — R5383 Other fatigue: Secondary | ICD-10-CM | POA: Diagnosis not present

## 2012-07-09 MED ORDER — TRIAMCINOLONE ACETONIDE 0.5 % EX OINT: TOPICAL_OINTMENT | Freq: Two times a day (BID) | CUTANEOUS | Status: DC

## 2012-07-09 MED ORDER — PRAVASTATIN SODIUM 20 MG PO TABS: 20.0000 mg | ORAL_TABLET | Freq: Every day | ORAL | Status: DC

## 2012-07-09 NOTE — Patient Instructions (Addendum)
For blood pressure recommend to hold you pill if top number is under 105.  Otherwise take the medication daily. You blood pressure is likely to be lower if you have not been eating or drinking regularly. So you may want to check her blood pressure specifically on those days.  For your cholesterol we can modify your risk factors by having you take a statin. We will need to monitor for muscle aches and pains. We will have to see if it is still any different from her baseline. Eating a high-fiber diet will help as well. He can also consider something such as flaxseed which she can sprinkle on top of foot and that will help lower cholesterol as well. Try work on regular exercise as much as you're able to tolerate.  To reduce her risk of stroke we typically focus on blood pressure and cholesterol. Controlling both of these we'll make a big difference in your future risk. This is also true for future risk of heart disease.  For your nosebleeds recommend using a humidifier especially running at night is helpful in her bedroom. This helps moisturize the nasal passages. Can also try a moisturizing nasal saline gel. This can be helpful as well as initially after getting cracking and nosebleeds.  For your right arm pain and swelling. Recommend icing as needed for pain and swelling. Also applying a light Ace wrap can be helpful as well, if it does not worsen pain. This is not uncommon with infusions.  I will the imaging scans and see if there is a comparison for the possible lesion that they saw on this last scan. We can discuss further at followup next week.

## 2012-07-09 NOTE — Telephone Encounter (Signed)
She doesn't think she does. Please send one to her pharm. Pt also states that 2 siblings couldn't tolerate lipitor.

## 2012-07-09 NOTE — Telephone Encounter (Signed)
Pt informed

## 2012-07-09 NOTE — Progress Notes (Signed)
Subjective:    Patient ID: Jennifer [name], female    DOB: [birth year], 46 y.o.   MRN: [id number]  HPI   She is schedule for 3 iron treatments. Had one yesterday.  They had recommeneded an ambulatory blood pressure.  She has some residual bruising and swelling in her right forearm. She did ice it. She's wants to make sure that it's okay. If still very tender to touch and sore. No erythema or fever.  Has a rash on left upper chest since being in the hospital.  Not ichey.  They have a small center that is scabby.  Says still feels very flushed.  Has been having bilateral jaw pain and pain from her jaw into her anterior neck. Still has posterior cervical neck pain.  Had episode last week where got arm pain and then sudden weakness and had to drive with her left arm. Lasted for about 10 min.  She is worried mostly about her heart bc has + fam hx of heart dz and about having TIAs because has had several episodes over the year where she feels that and will actually lose her balance and lean to one side and felt weak all over. She has a known history of elevated cholesterol but when they checked at the hospital her LDL is 170. She really is not sure if she was truly fasting at the time or not. And they had to her home with a prescription for 10 mg of atorvastatin, to take 2 at bedtime.   She [facility] to [facility] ED on 07/04/2012 for generalized weakness. They did a CBC which was fairly normal as well as a BMP which is normal. Cholesterol was elevated. Hemoglobin A1c was 5.6 which is fantastic. Thyroid is normal. PT and PTT were normal. She had negative urine drug screen. Cardiac enzymes were normal. She was hypoglycemic when she arrived. It was 66. She also had an MRI of the brain without contrast as well as a head CT. The MRI showed scattered T2 flair hyperintensities noted in the periventricular and subcortical white matter is consistent with microangiopathy. Other etiologies and cleared remote trauma,  inflammation, demyelination, and chronic ischemia due to migraine or other vasculopathy is. She does have a history of migraines but has not had a problem with them in several years. They also noticed a 7 mm x 4 mm x 9 mm enhancing lesion at the base adjacent to the lateral aspect of the cerebral falx. They recommended that this be compared to previous imaging to see if this is just consistent with a meningioma or some other type of malignancy.   Review of Systems     Objective:   Physical Exam  Constitutional: She is oriented to person, place, and time. She appears well-developed and well-nourished.  HENT:  Head: Normocephalic and atraumatic.  Eyes: Conjunctivae normal and EOM are normal.  Cardiovascular: Normal rate.   Pulmonary/Chest: Effort normal.  Musculoskeletal:       Right forearm is tenderness to touch. There some significant bruising posteriorly. There is some trace swelling but no pitting edema. No signs of erythema or phlebitis. No active wounds or drainage.  Neurological: She is alert and oriented to person, place, and time.  Skin: Skin is dry. No pallor.  Psychiatric: She has a normal mood and affect. Her behavior is normal.          Assessment & Plan:  Right arm pain status post infusion-I. gave her reassurance. It may take a couple  weeks the swelling to go completely down. I see no evidence of phlebitis or cellulitis. Certainly call if she sees any redness or streaking or runs a fever. At this point she can ice for comfort and keep elevated and had an Ace wrap if it is not too uncomfortable.  Hyperlipidemia-I explained to her with her concerns about her strong family history of heart disease as well as stroke that what we have to really focus on is modifying her risk factors. We have no control of her genetics but I explained her that things such as healthy low-cholesterol low-fat diet in addition to regular exercise, in addition to controlling lipids, in addition to  controlling blood pressure all help reduce her risk factors for these conditions. I really would like her to start the atorvastatin but we will start at 10 mg instead of 20 and see if she tolerates this well. I explained her the risk of myalgias and that we would certainly keep an eye on this. She does have what I believe to be fibromyalgia and so we will need to make sure that if the medication worsens her symptoms that it really is the medication and not just a flare up of fibromyalgia. We may need to check a CK if this happens.  Family history of heart disease-I explained her that this point time she does not have known heart disease but certainly there again we can control blood pressure and cholesterol and make an impact in her long-term risk. We also discussed starting a baby aspirin but with her reflux issues and prior flushing on aspirin I think it best to stay away from this at this time. He is concerned because she does have 2 sisters that have not tolerated Lipitor.  Probable meningioma seen on MRI on 07/04/2012 at [facility]. She brought in an old scan done at [facility] on 01/18/2008 which also showed a small 3-6 mm probable meningioma involving the superior and posterior aspect of the cerebral falx. This change is not sig over a 4 year period. This is reassuring that it is not a tumor and is a benign meningioma.    Overall she is just very frustrated today. She knows she is in a cycle of negativity. She constantly worries about her health and her heart. And when she gets even mild symptoms or even more severe symptoms she wishes to the emergency room. She knows that she is in a bad cycle and really wants to get out of it but doesn't know what to do. She's been seeing a therapist and a psychiatrist for years and feels that she has made some progress but just doesn't know how to break her habits. I reassured her that the best we can do is focus on the things that we can change and just  try to lipids I think that we cannot change such as family history and genetics. Certainly we can work on improving blood pressure and be more consistent with the medication. We can also certainly work on treating her high cholesterol which will significantly reduce her risk for heart disease and stroke.  Time spent 30 min, > 50% in counseling.

## 2012-07-09 NOTE — Telephone Encounter (Signed)
Prescriptions sent for triamcinolone, which is a topical steroid cream. I will send a prescription for pravastatin and place of Lipitor for her to try.

## 2012-07-09 NOTE — Telephone Encounter (Signed)
Please call pt and see if she already has a topical steroid cream at home to put on her rash. Since it is topical she should tolerate it well. If not elet me know and i wills send over a rx.

## 2012-07-11 DIAGNOSIS — R52 Pain, unspecified: Secondary | ICD-10-CM | POA: Diagnosis not present

## 2012-07-11 DIAGNOSIS — Z79899 Other long term (current) drug therapy: Secondary | ICD-10-CM | POA: Diagnosis not present

## 2012-07-11 DIAGNOSIS — IMO0001 Reserved for inherently not codable concepts without codable children: Secondary | ICD-10-CM | POA: Diagnosis not present

## 2012-07-11 IMAGING — CR DG CHEST 2V
2 series · 2 of 2 positions shown · non-contrast
Comparison: 04/13/2011

CLINICAL DATA: Shortness of breath, chronic cough, congestion.

CHEST - 2 VIEW

[w chest pa]
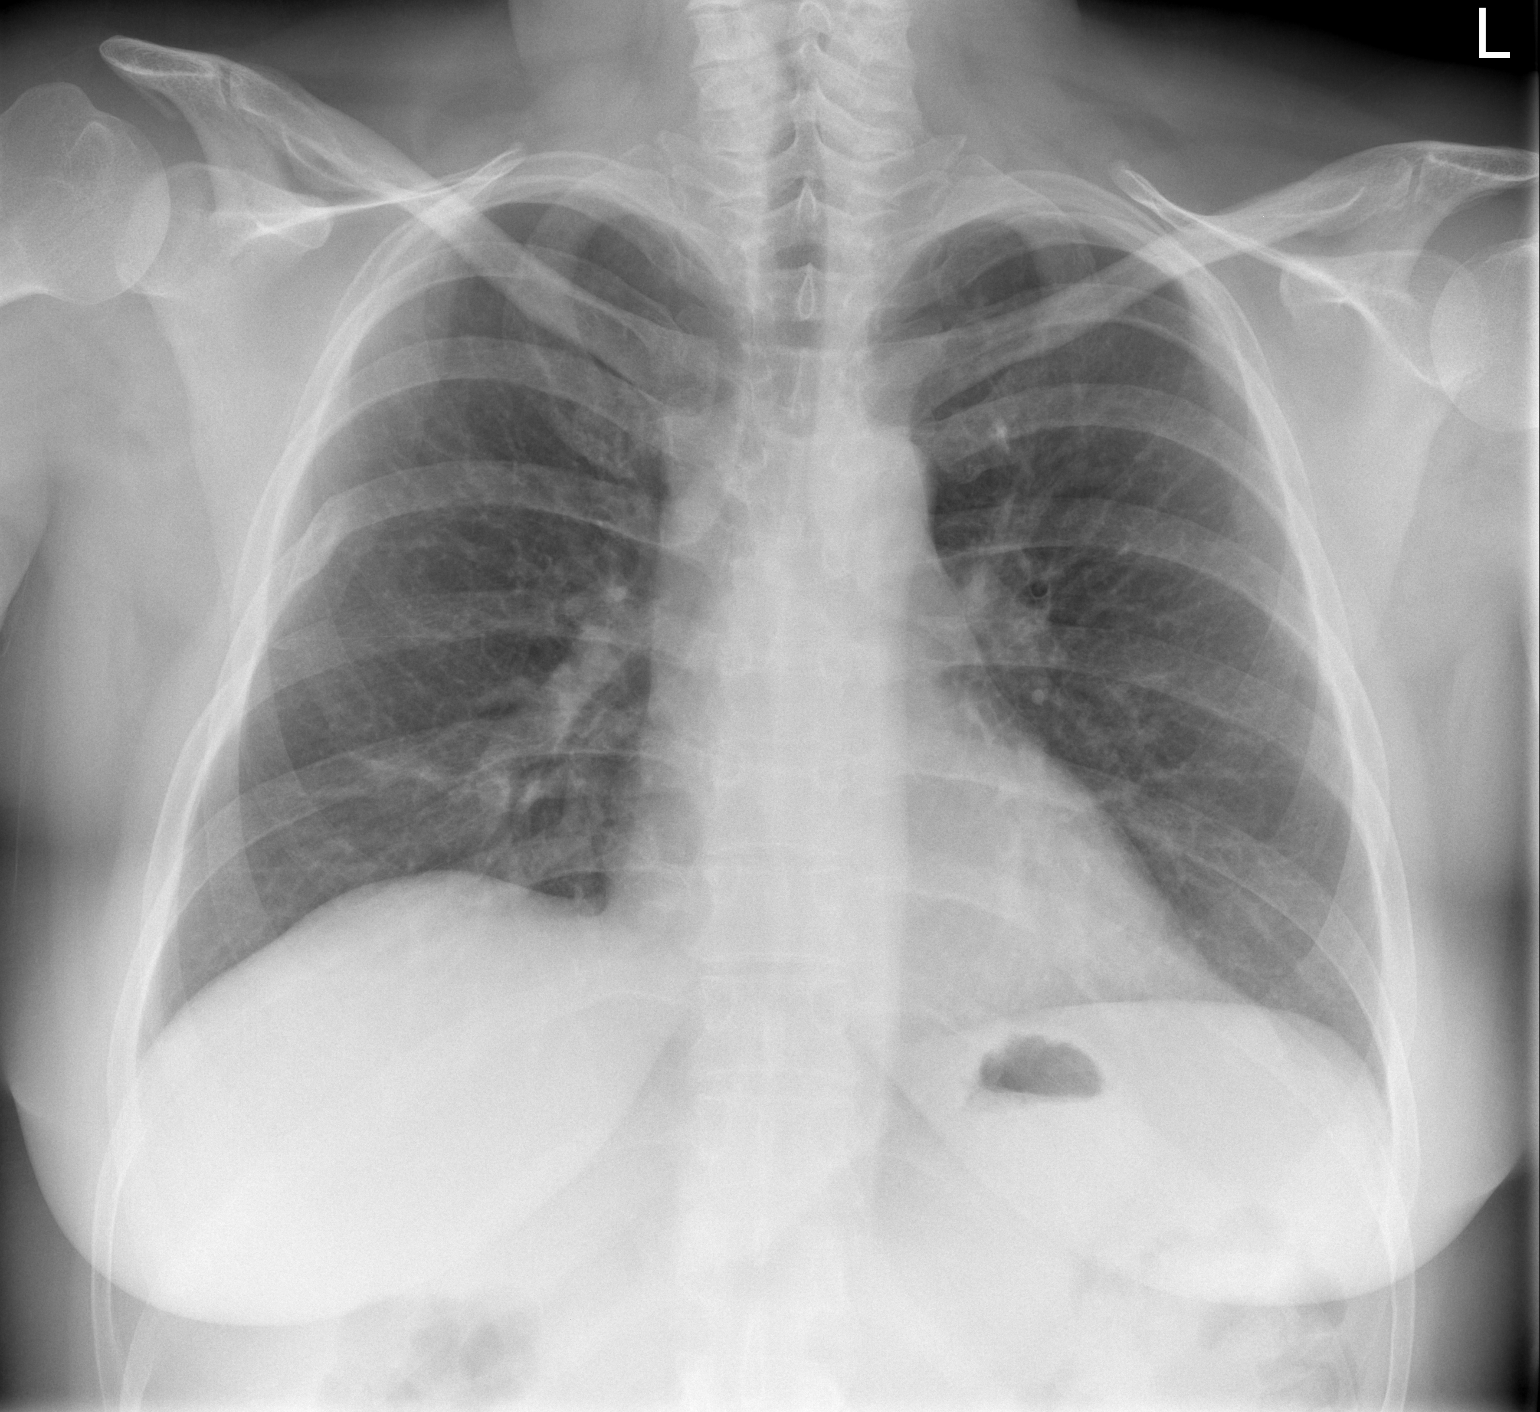

[w chest lat]
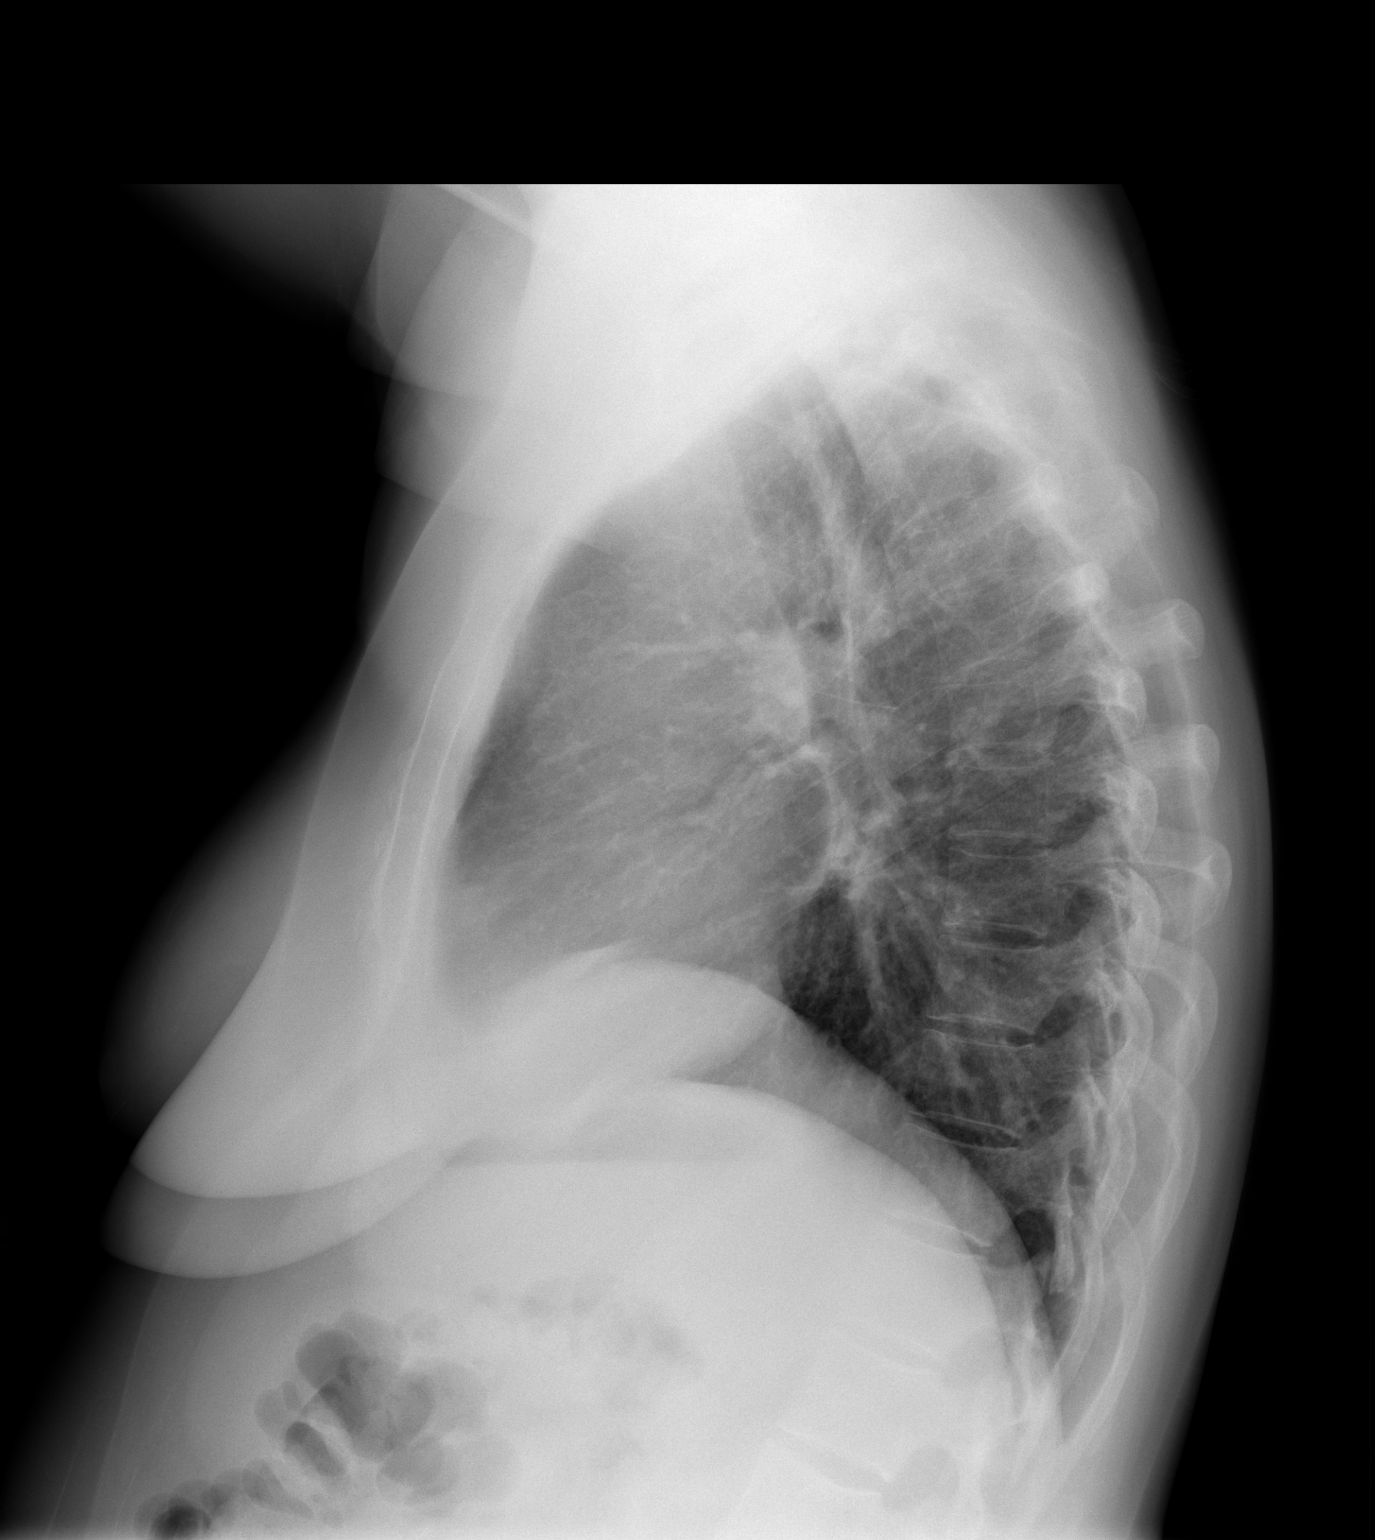

[2 of 2 positions shown; findings below may reference images not displayed]

FINDINGS: Heart and mediastinal contours are within normal limits.
No focal opacities or effusions.  No acute bony abnormality.
IMPRESSION: No active disease.

## 2012-07-11 IMAGING — CR DG NECK SOFT TISSUE
2 series · 2 of 2 positions shown · non-contrast
Comparison: 11/18/2010

CLINICAL DATA: Prolonged cough, congestion.

NECK SOFT TISSUES - 1+ VIEW

[w soft tissue neck]
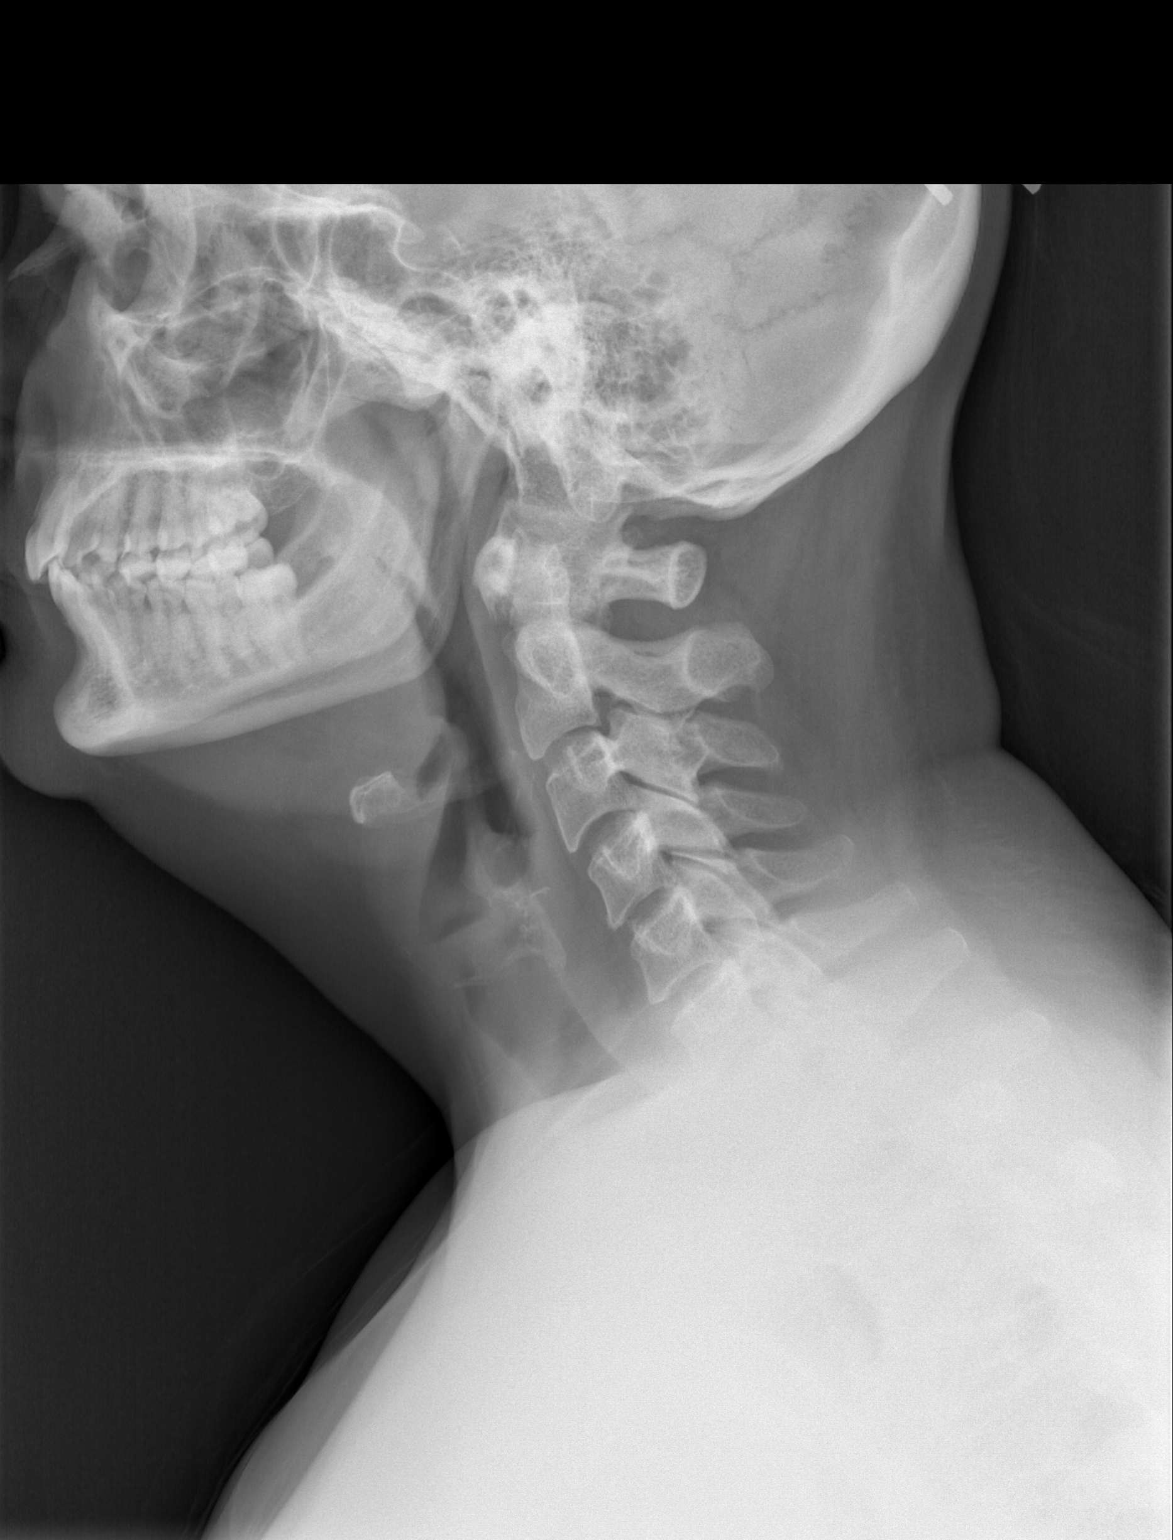

[w soft tissue neck ap]
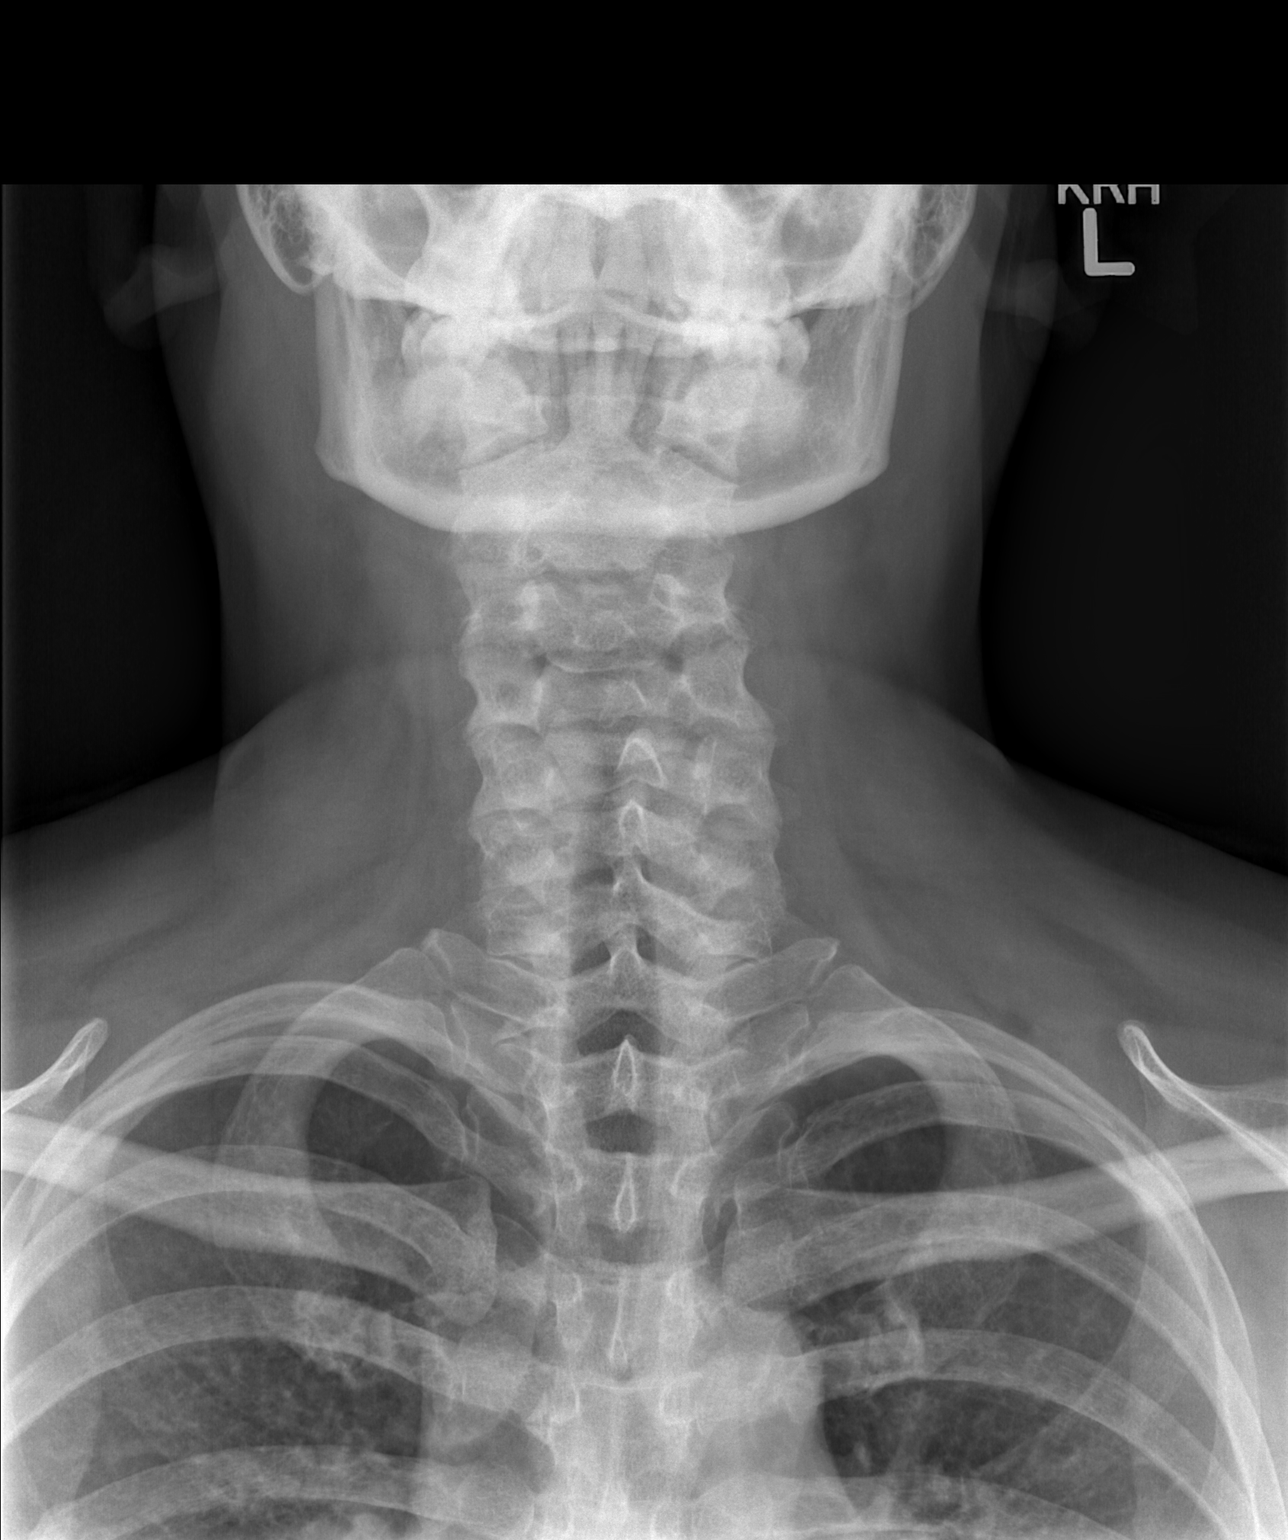

[2 of 2 positions shown; findings below may reference images not displayed]

FINDINGS: Epiglottis and aryepiglottic folds are normal.  Airway is
patent.  Retropharyngeal soft tissues are normal.  Visualized bony
structures normal.  No radiopaque foreign bodies.
IMPRESSION: Normal study.

## 2012-07-15 ENCOUNTER — Telehealth: Payer: Self-pay | Admitting: *Deleted

## 2012-07-15 DIAGNOSIS — G894 Chronic pain syndrome: Secondary | ICD-10-CM | POA: Diagnosis not present

## 2012-07-15 DIAGNOSIS — R0602 Shortness of breath: Secondary | ICD-10-CM | POA: Diagnosis not present

## 2012-07-15 DIAGNOSIS — K1230 Oral mucositis (ulcerative), unspecified: Secondary | ICD-10-CM | POA: Diagnosis not present

## 2012-07-15 DIAGNOSIS — N8500 Endometrial hyperplasia, unspecified: Secondary | ICD-10-CM | POA: Diagnosis not present

## 2012-07-15 DIAGNOSIS — J209 Acute bronchitis, unspecified: Secondary | ICD-10-CM | POA: Diagnosis not present

## 2012-07-15 DIAGNOSIS — R079 Chest pain, unspecified: Secondary | ICD-10-CM | POA: Diagnosis not present

## 2012-07-15 DIAGNOSIS — Z79899 Other long term (current) drug therapy: Secondary | ICD-10-CM | POA: Diagnosis not present

## 2012-07-15 DIAGNOSIS — R000 Tachycardia, unspecified: Secondary | ICD-10-CM | POA: Diagnosis not present

## 2012-07-15 DIAGNOSIS — E611 Iron deficiency: Secondary | ICD-10-CM | POA: Insufficient documentation

## 2012-07-15 DIAGNOSIS — R109 Unspecified abdominal pain: Secondary | ICD-10-CM | POA: Diagnosis not present

## 2012-07-15 DIAGNOSIS — C519 Malignant neoplasm of vulva, unspecified: Secondary | ICD-10-CM | POA: Diagnosis not present

## 2012-07-15 DIAGNOSIS — I10 Essential (primary) hypertension: Secondary | ICD-10-CM | POA: Diagnosis not present

## 2012-07-15 DIAGNOSIS — R0609 Other forms of dyspnea: Secondary | ICD-10-CM | POA: Diagnosis not present

## 2012-07-15 DIAGNOSIS — Z8673 Personal history of transient ischemic attack (TIA), and cerebral infarction without residual deficits: Secondary | ICD-10-CM | POA: Diagnosis not present

## 2012-07-15 DIAGNOSIS — E269 Hyperaldosteronism, unspecified: Secondary | ICD-10-CM | POA: Diagnosis not present

## 2012-07-15 DIAGNOSIS — R1011 Right upper quadrant pain: Secondary | ICD-10-CM | POA: Diagnosis not present

## 2012-07-15 DIAGNOSIS — K121 Other forms of stomatitis: Secondary | ICD-10-CM | POA: Diagnosis not present

## 2012-07-15 DIAGNOSIS — J45901 Unspecified asthma with (acute) exacerbation: Secondary | ICD-10-CM | POA: Diagnosis not present

## 2012-07-15 NOTE — Telephone Encounter (Signed)
See telephone note.

## 2012-07-16 ENCOUNTER — Ambulatory Visit (INDEPENDENT_AMBULATORY_CARE_PROVIDER_SITE_OTHER): Payer: Medicare Other

## 2012-07-16 ENCOUNTER — Ambulatory Visit (INDEPENDENT_AMBULATORY_CARE_PROVIDER_SITE_OTHER): Payer: Medicare Other | Admitting: Family Medicine

## 2012-07-16 ENCOUNTER — Emergency Department (HOSPITAL_BASED_OUTPATIENT_CLINIC_OR_DEPARTMENT_OTHER)
Admission: EM | Admit: 2012-07-16 | Discharge: 2012-07-16 | Disposition: A | Discharge status: Home / Self Care | Payer: Medicare Other | Attending: Emergency Medicine | Admitting: Emergency Medicine

## 2012-07-16 ENCOUNTER — Encounter: Payer: Self-pay | Admitting: Family Medicine

## 2012-07-16 ENCOUNTER — Encounter (HOSPITAL_BASED_OUTPATIENT_CLINIC_OR_DEPARTMENT_OTHER): Payer: Self-pay | Admitting: Emergency Medicine

## 2012-07-16 VITALS — BP 139/85 | HR 119 | Ht 62.0 in | Wt 183.0 lb

## 2012-07-16 DIAGNOSIS — J45909 Unspecified asthma, uncomplicated: Secondary | ICD-10-CM | POA: Insufficient documentation

## 2012-07-16 DIAGNOSIS — Z8742 Personal history of other diseases of the female genital tract: Secondary | ICD-10-CM | POA: Diagnosis not present

## 2012-07-16 DIAGNOSIS — R059 Cough, unspecified: Secondary | ICD-10-CM

## 2012-07-16 DIAGNOSIS — I10 Essential (primary) hypertension: Secondary | ICD-10-CM | POA: Diagnosis not present

## 2012-07-16 DIAGNOSIS — F4310 Post-traumatic stress disorder, unspecified: Secondary | ICD-10-CM | POA: Diagnosis not present

## 2012-07-16 DIAGNOSIS — H8110 Benign paroxysmal vertigo, unspecified ear: Secondary | ICD-10-CM | POA: Diagnosis not present

## 2012-07-16 DIAGNOSIS — Z8719 Personal history of other diseases of the digestive system: Secondary | ICD-10-CM | POA: Insufficient documentation

## 2012-07-16 DIAGNOSIS — E785 Hyperlipidemia, unspecified: Secondary | ICD-10-CM

## 2012-07-16 DIAGNOSIS — Z8709 Personal history of other diseases of the respiratory system: Secondary | ICD-10-CM | POA: Diagnosis not present

## 2012-07-16 DIAGNOSIS — Z9109 Other allergy status, other than to drugs and biological substances: Secondary | ICD-10-CM | POA: Insufficient documentation

## 2012-07-16 DIAGNOSIS — Z79899 Other long term (current) drug therapy: Secondary | ICD-10-CM | POA: Insufficient documentation

## 2012-07-16 DIAGNOSIS — R05 Cough: Secondary | ICD-10-CM

## 2012-07-16 DIAGNOSIS — Z87891 Personal history of nicotine dependence: Secondary | ICD-10-CM | POA: Diagnosis not present

## 2012-07-16 DIAGNOSIS — J40 Bronchitis, not specified as acute or chronic: Secondary | ICD-10-CM | POA: Diagnosis not present

## 2012-07-16 DIAGNOSIS — R51 Headache: Secondary | ICD-10-CM | POA: Diagnosis not present

## 2012-07-16 DIAGNOSIS — K219 Gastro-esophageal reflux disease without esophagitis: Secondary | ICD-10-CM | POA: Insufficient documentation

## 2012-07-16 DIAGNOSIS — E269 Hyperaldosteronism, unspecified: Secondary | ICD-10-CM | POA: Diagnosis not present

## 2012-07-16 DIAGNOSIS — Z8739 Personal history of other diseases of the musculoskeletal system and connective tissue: Secondary | ICD-10-CM | POA: Diagnosis not present

## 2012-07-16 DIAGNOSIS — Z8669 Personal history of other diseases of the nervous system and sense organs: Secondary | ICD-10-CM | POA: Diagnosis not present

## 2012-07-16 DIAGNOSIS — Z862 Personal history of diseases of the blood and blood-forming organs and certain disorders involving the immune mechanism: Secondary | ICD-10-CM | POA: Insufficient documentation

## 2012-07-16 DIAGNOSIS — R0789 Other chest pain: Secondary | ICD-10-CM

## 2012-07-16 DIAGNOSIS — K121 Other forms of stomatitis: Secondary | ICD-10-CM | POA: Insufficient documentation

## 2012-07-16 DIAGNOSIS — J209 Acute bronchitis, unspecified: Secondary | ICD-10-CM | POA: Diagnosis not present

## 2012-07-16 DIAGNOSIS — G4730 Sleep apnea, unspecified: Secondary | ICD-10-CM | POA: Diagnosis not present

## 2012-07-16 DIAGNOSIS — Z8679 Personal history of other diseases of the circulatory system: Secondary | ICD-10-CM | POA: Insufficient documentation

## 2012-07-16 MED ORDER — LOVASTATIN 10 MG PO TABS: 10.0000 mg | ORAL_TABLET | Freq: Every day | ORAL | Status: DC

## 2012-07-16 MED ORDER — CEPHALEXIN 500 MG PO CAPS: 500.0000 mg | ORAL_CAPSULE | Freq: Four times a day (QID) | ORAL | Status: DC
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
## 2012-07-16 NOTE — Progress Notes (Signed)
  Subjective:    Patient ID: Jennifer [name], female    DOB: 01-11-1966, 46 y.o.   MRN: [id number]  HPI Having more vertigo spells. Says jumped out of bed last night and felt like she was"surfing" to the bathroom. Did PT at one time but says she him as needed driver after the end of the therapy.  Yesterday was having palpiations, that last about 2 minutes.  Says then developed CP.   BP was good. Went to [facility].  Still feels sore in her chest today.  She's also noticed some increase in productive sputum over the last several days. No fever. No other cold symptoms. She says they did not do a chest x-ray. She's concerned because when she says similar symptoms in the past we did find something on her chest x-ray.  No wheezing.  Hypertension-she says she did help her blood pressure pill for a couple of days but is back on it.  Hyperlipidemia-we had called in a prescription for pravastatin. She would hold off on trying Lipitor as her sisters have had side effects with that medication. She said she took the pravastatin at night she hurts all over. She said she felt so weak she was actually afraid to get out of bed. This was only after one night.  Review of Systems     Objective:   Physical Exam  Constitutional: She is oriented to person, place, and time. She appears well-developed and well-nourished.  HENT:  Head: Normocephalic and atraumatic.  Cardiovascular: Normal rate, regular rhythm and normal heart sounds.   Pulmonary/Chest: Effort normal and breath sounds normal.  Neurological: She is alert and oriented to person, place, and time.  Skin: Skin is warm and dry.  Psychiatric: She has a normal mood and affect. Her behavior is normal.          Assessment & Plan:  Benign positional vertigo-I. think she'll be great candidate to revisit physical therapy for this. I explained everything she is describing is very consistent with BPV. She says she will think about it. I think this  could be really helpful. Then she may have to have a driver afterwards it really does make her more dizzy.  Hyperlipidemia-explained her again that she reduce her risk of future heart attack and stroke that she does need to strongly consider a statin. We will try Mevacor since it does come generic.  Hypertension-make sure taking medication consistently. I did write out some parameters for her for 1 to hold it if her blood pressure is too low.  Atypical chest pain-I do not think that this is related to her in any way shape or form. Because she has had some productive sputum we will get a chest x-ray just to evaluate for possible pneumonia. If this is normal then I suspect that this is most likely part of her fibromyalgia.  Time spent 25 min, >50% spent in cousneling.

## 2012-07-16 NOTE — ED Notes (Signed)
Pt reports seen by PCP earlier today "diagnosed w/ bronchitis". Pt now reports blisters in mouth and sts feels lump in throat/ drainage.

## 2012-07-17 DIAGNOSIS — R0602 Shortness of breath: Secondary | ICD-10-CM | POA: Diagnosis not present

## 2012-07-17 DIAGNOSIS — Z884 Allergy status to anesthetic agent status: Secondary | ICD-10-CM | POA: Diagnosis not present

## 2012-07-17 DIAGNOSIS — Z79899 Other long term (current) drug therapy: Secondary | ICD-10-CM | POA: Diagnosis not present

## 2012-07-17 DIAGNOSIS — J45909 Unspecified asthma, uncomplicated: Secondary | ICD-10-CM | POA: Diagnosis not present

## 2012-07-17 DIAGNOSIS — I10 Essential (primary) hypertension: Secondary | ICD-10-CM | POA: Diagnosis not present

## 2012-07-17 DIAGNOSIS — F17200 Nicotine dependence, unspecified, uncomplicated: Secondary | ICD-10-CM | POA: Diagnosis not present

## 2012-07-17 DIAGNOSIS — R0609 Other forms of dyspnea: Secondary | ICD-10-CM | POA: Diagnosis not present

## 2012-07-17 DIAGNOSIS — N9089 Other specified noninflammatory disorders of vulva and perineum: Secondary | ICD-10-CM | POA: Diagnosis not present

## 2012-07-17 DIAGNOSIS — Z881 Allergy status to other antibiotic agents status: Secondary | ICD-10-CM | POA: Diagnosis not present

## 2012-07-17 DIAGNOSIS — Z8673 Personal history of transient ischemic attack (TIA), and cerebral infarction without residual deficits: Secondary | ICD-10-CM | POA: Diagnosis not present

## 2012-07-17 DIAGNOSIS — Z882 Allergy status to sulfonamides status: Secondary | ICD-10-CM | POA: Diagnosis not present

## 2012-07-17 DIAGNOSIS — Z888 Allergy status to other drugs, medicaments and biological substances status: Secondary | ICD-10-CM | POA: Diagnosis not present

## 2012-07-17 DIAGNOSIS — F319 Bipolar disorder, unspecified: Secondary | ICD-10-CM | POA: Diagnosis not present

## 2012-07-17 DIAGNOSIS — J309 Allergic rhinitis, unspecified: Secondary | ICD-10-CM | POA: Diagnosis not present

## 2012-07-17 DIAGNOSIS — J45901 Unspecified asthma with (acute) exacerbation: Secondary | ICD-10-CM | POA: Diagnosis not present

## 2012-07-17 DIAGNOSIS — R0989 Other specified symptoms and signs involving the circulatory and respiratory systems: Secondary | ICD-10-CM | POA: Diagnosis not present

## 2012-07-17 DIAGNOSIS — R079 Chest pain, unspecified: Secondary | ICD-10-CM | POA: Diagnosis not present

## 2012-07-17 DIAGNOSIS — Z880 Allergy status to penicillin: Secondary | ICD-10-CM | POA: Diagnosis not present

## 2012-07-17 DIAGNOSIS — M889 Osteitis deformans of unspecified bone: Secondary | ICD-10-CM | POA: Diagnosis not present

## 2012-07-18 ENCOUNTER — Encounter (HOSPITAL_COMMUNITY): Payer: Self-pay | Admitting: *Deleted

## 2012-07-18 ENCOUNTER — Emergency Department (HOSPITAL_COMMUNITY): Payer: Medicare Other

## 2012-07-18 ENCOUNTER — Emergency Department (HOSPITAL_COMMUNITY)
Admission: EM | Admit: 2012-07-18 | Discharge: 2012-07-18 | Disposition: A | Discharge status: Home / Self Care | Payer: Medicare Other | Attending: Emergency Medicine | Admitting: Emergency Medicine

## 2012-07-18 DIAGNOSIS — Z9889 Other specified postprocedural states: Secondary | ICD-10-CM | POA: Insufficient documentation

## 2012-07-18 DIAGNOSIS — Z87891 Personal history of nicotine dependence: Secondary | ICD-10-CM | POA: Insufficient documentation

## 2012-07-18 DIAGNOSIS — Z8669 Personal history of other diseases of the nervous system and sense organs: Secondary | ICD-10-CM | POA: Diagnosis not present

## 2012-07-18 DIAGNOSIS — Z8659 Personal history of other mental and behavioral disorders: Secondary | ICD-10-CM | POA: Diagnosis not present

## 2012-07-18 DIAGNOSIS — R1011 Right upper quadrant pain: Secondary | ICD-10-CM | POA: Insufficient documentation

## 2012-07-18 DIAGNOSIS — R109 Unspecified abdominal pain: Secondary | ICD-10-CM | POA: Diagnosis not present

## 2012-07-18 DIAGNOSIS — E785 Hyperlipidemia, unspecified: Secondary | ICD-10-CM | POA: Insufficient documentation

## 2012-07-18 DIAGNOSIS — Z862 Personal history of diseases of the blood and blood-forming organs and certain disorders involving the immune mechanism: Secondary | ICD-10-CM | POA: Diagnosis not present

## 2012-07-18 DIAGNOSIS — E269 Hyperaldosteronism, unspecified: Secondary | ICD-10-CM | POA: Insufficient documentation

## 2012-07-18 DIAGNOSIS — Z8679 Personal history of other diseases of the circulatory system: Secondary | ICD-10-CM | POA: Diagnosis not present

## 2012-07-18 DIAGNOSIS — Z8719 Personal history of other diseases of the digestive system: Secondary | ICD-10-CM | POA: Diagnosis not present

## 2012-07-18 DIAGNOSIS — J45909 Unspecified asthma, uncomplicated: Secondary | ICD-10-CM | POA: Insufficient documentation

## 2012-07-18 DIAGNOSIS — R112 Nausea with vomiting, unspecified: Secondary | ICD-10-CM | POA: Diagnosis not present

## 2012-07-18 DIAGNOSIS — I10 Essential (primary) hypertension: Secondary | ICD-10-CM | POA: Diagnosis not present

## 2012-07-18 DIAGNOSIS — G4730 Sleep apnea, unspecified: Secondary | ICD-10-CM | POA: Insufficient documentation

## 2012-07-18 LAB — URINALYSIS, ROUTINE W REFLEX MICROSCOPIC
Bilirubin Urine: NEGATIVE
Glucose, UA: NEGATIVE mg/dL
Hgb urine dipstick: NEGATIVE
Ketones, ur: NEGATIVE mg/dL
Leukocytes, UA: NEGATIVE
Nitrite: NEGATIVE
Protein, ur: NEGATIVE mg/dL
Specific Gravity, Urine: 1.028 (ref 1.005–1.030)
Urobilinogen, UA: 0.2 mg/dL (ref 0.0–1.0)
pH: 5.5 (ref 5.0–8.0)

## 2012-07-18 LAB — CBC WITH DIFFERENTIAL/PLATELET
Basophils Absolute: 0 10*3/uL (ref 0.0–0.1)
Basophils Relative: 0 % (ref 0–1)
Eosinophils Absolute: 0.1 10*3/uL (ref 0.0–0.7)
Eosinophils Relative: 2 % (ref 0–5)
HCT: 44.1 % (ref 36.0–46.0)
Hemoglobin: 14.6 g/dL (ref 12.0–15.0)
Lymphocytes Relative: 24 % (ref 12–46)
Lymphs Abs: 1.7 10*3/uL (ref 0.7–4.0)
MCH: 28.8 pg (ref 26.0–34.0)
MCHC: 33.1 g/dL (ref 30.0–36.0)
MCV: 87 fL (ref 78.0–100.0)
Monocytes Absolute: 0.5 10*3/uL (ref 0.1–1.0)
Monocytes Relative: 8 % (ref 3–12)
Neutro Abs: 4.5 10*3/uL (ref 1.7–7.7)
Neutrophils Relative %: 66 % (ref 43–77)
Platelets: 239 10*3/uL (ref 150–400)
RBC: 5.07 MIL/uL (ref 3.87–5.11)
RDW: 13.8 % (ref 11.5–15.5)
WBC: 6.8 10*3/uL (ref 4.0–10.5)

## 2012-07-18 LAB — LIPASE, BLOOD: Lipase: 51 U/L (ref 11–59)

## 2012-07-18 LAB — COMPREHENSIVE METABOLIC PANEL
ALT: 21 U/L (ref 0–35)
AST: 14 U/L (ref 0–37)
Albumin: 4.1 g/dL (ref 3.5–5.2)
Alkaline Phosphatase: 67 U/L (ref 39–117)
BUN: 13 mg/dL (ref 6–23)
CO2: 24 mEq/L (ref 19–32)
Calcium: 9.7 mg/dL (ref 8.4–10.5)
Chloride: 105 mEq/L (ref 96–112)
Creatinine, Ser: 0.61 mg/dL (ref 0.50–1.10)
GFR calc Af Amer: >90 mL/min (ref >90)
GFR calc non Af Amer: >90 mL/min (ref >90)
Glucose, Bld: 81 mg/dL (ref 70–99)
Potassium: 3.7 mEq/L (ref 3.5–5.1)
Sodium: 140 mEq/L (ref 135–145)
Total Bilirubin: 0.3 mg/dL (ref 0.3–1.2)
Total Protein: 7.9 g/dL (ref 6.0–8.3)

## 2012-07-18 MED ORDER — ONDANSETRON 4 MG PO TBDP
4.0000 mg | ORAL_TABLET | Freq: Once | ORAL | Status: AC
  Administered 2012-07-18: 4 mg via ORAL
  Filled 2012-07-18: qty 1

## 2012-07-18 MED ORDER — TRAMADOL HCL 50 MG PO TABS: 50.0000 mg | ORAL_TABLET | Freq: Four times a day (QID) | ORAL | Status: DC | PRN

## 2012-07-18 MED ORDER — ONDANSETRON HCL 4 MG/2ML IJ SOLN: 4.0000 mg | Freq: Once | INTRAMUSCULAR | Status: DC

## 2012-07-18 MED ORDER — HYDROMORPHONE HCL PF 1 MG/ML IJ SOLN: 1.0000 mg | Freq: Once | INTRAMUSCULAR | Status: DC

## 2012-07-18 MED ORDER — ACETAMINOPHEN 160 MG/5ML PO SOLN
650.0000 mg | Freq: Once | ORAL | Status: AC
  Administered 2012-07-18: 650 mg via ORAL
  Filled 2012-07-18: qty 20.3

## 2012-07-18 NOTE — ED Notes (Signed)
Pt c/o right upper quadrant abd pain and lower right quadrant pain that started last night. Pt also reports coughing up bitter taste. Reports pain is worse after eating.

## 2012-07-18 NOTE — ED Notes (Signed)
Called [facility] to check patient status.  Ultrasound tech says "patient is next"

## 2012-07-18 NOTE — ED Notes (Signed)
Patient is alert and oriented x3.  She was given DC instructions and follow up visit instructions.  Patient gave verbal understanding. She was DC ambulatory under her own power to home.  V/S stable.  He was not showing any signs of distress on DC 
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
## 2012-07-18 NOTE — ED Notes (Signed)
Pt refuses iv unless getting receiving fluids.  Pt refuses IV pain med.  Pt reports I only want chewable tylenol or liquid tylenol.  Pt refuses iv zofran, pt requests odt zofran to dissolve.  md made aware

## 2012-07-18 NOTE — ED Notes (Signed)
Pt ultrasound completed. 

## 2012-07-19 DIAGNOSIS — R1011 Right upper quadrant pain: Secondary | ICD-10-CM | POA: Diagnosis not present

## 2012-07-19 DIAGNOSIS — R109 Unspecified abdominal pain: Secondary | ICD-10-CM | POA: Diagnosis not present

## 2012-07-19 DIAGNOSIS — D649 Anemia, unspecified: Secondary | ICD-10-CM | POA: Diagnosis not present

## 2012-07-22 DIAGNOSIS — R1013 Epigastric pain: Secondary | ICD-10-CM | POA: Diagnosis not present

## 2012-07-22 DIAGNOSIS — F339 Major depressive disorder, recurrent, unspecified: Secondary | ICD-10-CM | POA: Diagnosis not present

## 2012-07-22 DIAGNOSIS — R109 Unspecified abdominal pain: Secondary | ICD-10-CM | POA: Diagnosis not present

## 2012-07-22 DIAGNOSIS — I10 Essential (primary) hypertension: Secondary | ICD-10-CM | POA: Diagnosis not present

## 2012-07-22 DIAGNOSIS — K5900 Constipation, unspecified: Secondary | ICD-10-CM | POA: Diagnosis not present

## 2012-07-22 DIAGNOSIS — R112 Nausea with vomiting, unspecified: Secondary | ICD-10-CM | POA: Diagnosis not present

## 2012-07-23 DIAGNOSIS — Z9851 Tubal ligation status: Secondary | ICD-10-CM | POA: Diagnosis not present

## 2012-07-23 DIAGNOSIS — R109 Unspecified abdominal pain: Secondary | ICD-10-CM | POA: Diagnosis not present

## 2012-07-23 DIAGNOSIS — D1809 Hemangioma of other sites: Secondary | ICD-10-CM | POA: Diagnosis not present

## 2012-07-23 DIAGNOSIS — K5900 Constipation, unspecified: Secondary | ICD-10-CM | POA: Diagnosis not present

## 2012-07-23 DIAGNOSIS — Z9889 Other specified postprocedural states: Secondary | ICD-10-CM | POA: Diagnosis not present

## 2012-07-24 DIAGNOSIS — R109 Unspecified abdominal pain: Secondary | ICD-10-CM | POA: Diagnosis not present

## 2012-07-24 DIAGNOSIS — F339 Major depressive disorder, recurrent, unspecified: Secondary | ICD-10-CM | POA: Diagnosis not present

## 2012-07-25 DIAGNOSIS — J45901 Unspecified asthma with (acute) exacerbation: Secondary | ICD-10-CM | POA: Diagnosis not present

## 2012-07-25 DIAGNOSIS — R059 Cough, unspecified: Secondary | ICD-10-CM | POA: Diagnosis not present

## 2012-07-25 DIAGNOSIS — R05 Cough: Secondary | ICD-10-CM | POA: Diagnosis not present

## 2012-07-26 ENCOUNTER — Encounter (HOSPITAL_BASED_OUTPATIENT_CLINIC_OR_DEPARTMENT_OTHER): Payer: Self-pay

## 2012-07-26 ENCOUNTER — Emergency Department (HOSPITAL_BASED_OUTPATIENT_CLINIC_OR_DEPARTMENT_OTHER)
Admission: EM | Admit: 2012-07-26 | Discharge: 2012-07-27 | Disposition: A | Discharge status: Home / Self Care | Payer: Medicare Other | Attending: Emergency Medicine | Admitting: Emergency Medicine

## 2012-07-26 DIAGNOSIS — J019 Acute sinusitis, unspecified: Secondary | ICD-10-CM | POA: Diagnosis not present

## 2012-07-26 DIAGNOSIS — Z79899 Other long term (current) drug therapy: Secondary | ICD-10-CM | POA: Diagnosis not present

## 2012-07-26 DIAGNOSIS — J309 Allergic rhinitis, unspecified: Secondary | ICD-10-CM | POA: Insufficient documentation

## 2012-07-26 DIAGNOSIS — R6889 Other general symptoms and signs: Secondary | ICD-10-CM | POA: Diagnosis not present

## 2012-07-26 DIAGNOSIS — Z9851 Tubal ligation status: Secondary | ICD-10-CM | POA: Diagnosis not present

## 2012-07-26 DIAGNOSIS — E785 Hyperlipidemia, unspecified: Secondary | ICD-10-CM | POA: Insufficient documentation

## 2012-07-26 DIAGNOSIS — Z862 Personal history of diseases of the blood and blood-forming organs and certain disorders involving the immune mechanism: Secondary | ICD-10-CM | POA: Diagnosis not present

## 2012-07-26 DIAGNOSIS — R0609 Other forms of dyspnea: Secondary | ICD-10-CM | POA: Diagnosis not present

## 2012-07-26 DIAGNOSIS — Z8719 Personal history of other diseases of the digestive system: Secondary | ICD-10-CM | POA: Insufficient documentation

## 2012-07-26 DIAGNOSIS — Z8679 Personal history of other diseases of the circulatory system: Secondary | ICD-10-CM | POA: Diagnosis not present

## 2012-07-26 DIAGNOSIS — Z8739 Personal history of other diseases of the musculoskeletal system and connective tissue: Secondary | ICD-10-CM | POA: Insufficient documentation

## 2012-07-26 DIAGNOSIS — J45909 Unspecified asthma, uncomplicated: Secondary | ICD-10-CM | POA: Insufficient documentation

## 2012-07-26 DIAGNOSIS — J209 Acute bronchitis, unspecified: Secondary | ICD-10-CM | POA: Diagnosis not present

## 2012-07-26 DIAGNOSIS — Z87891 Personal history of nicotine dependence: Secondary | ICD-10-CM | POA: Insufficient documentation

## 2012-07-26 DIAGNOSIS — I10 Essential (primary) hypertension: Secondary | ICD-10-CM | POA: Diagnosis not present

## 2012-07-26 DIAGNOSIS — R0989 Other specified symptoms and signs involving the circulatory and respiratory systems: Secondary | ICD-10-CM | POA: Diagnosis not present

## 2012-07-26 DIAGNOSIS — Z8659 Personal history of other mental and behavioral disorders: Secondary | ICD-10-CM | POA: Diagnosis not present

## 2012-07-26 DIAGNOSIS — R0600 Dyspnea, unspecified: Secondary | ICD-10-CM

## 2012-07-26 NOTE — ED Notes (Signed)
Patient states that she was diagnosed with bronchitis and she is taking antibiotics for it and feels like it is not getting any better.

## 2012-07-26 NOTE — ED Notes (Signed)
C/o "asthma"-was seen by MD yesterday-started on zpack yesterday and "feels like my throat is clamping down"

## 2012-07-27 ENCOUNTER — Emergency Department (HOSPITAL_BASED_OUTPATIENT_CLINIC_OR_DEPARTMENT_OTHER): Payer: Medicare Other

## 2012-07-27 DIAGNOSIS — R6889 Other general symptoms and signs: Secondary | ICD-10-CM | POA: Diagnosis not present
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
## 2012-07-29 DIAGNOSIS — M889 Osteitis deformans of unspecified bone: Secondary | ICD-10-CM | POA: Diagnosis not present

## 2012-07-29 DIAGNOSIS — Z880 Allergy status to penicillin: Secondary | ICD-10-CM | POA: Diagnosis not present

## 2012-07-29 DIAGNOSIS — D649 Anemia, unspecified: Secondary | ICD-10-CM | POA: Diagnosis not present

## 2012-07-29 DIAGNOSIS — E269 Hyperaldosteronism, unspecified: Secondary | ICD-10-CM | POA: Diagnosis not present

## 2012-07-29 DIAGNOSIS — Z888 Allergy status to other drugs, medicaments and biological substances status: Secondary | ICD-10-CM | POA: Diagnosis not present

## 2012-07-29 DIAGNOSIS — R0609 Other forms of dyspnea: Secondary | ICD-10-CM | POA: Diagnosis not present

## 2012-07-29 DIAGNOSIS — Z882 Allergy status to sulfonamides status: Secondary | ICD-10-CM | POA: Diagnosis not present

## 2012-07-29 DIAGNOSIS — R0602 Shortness of breath: Secondary | ICD-10-CM | POA: Diagnosis not present

## 2012-07-29 DIAGNOSIS — I10 Essential (primary) hypertension: Secondary | ICD-10-CM | POA: Diagnosis not present

## 2012-07-29 DIAGNOSIS — R0989 Other specified symptoms and signs involving the circulatory and respiratory systems: Secondary | ICD-10-CM | POA: Diagnosis not present

## 2012-07-29 DIAGNOSIS — IMO0001 Reserved for inherently not codable concepts without codable children: Secondary | ICD-10-CM | POA: Diagnosis not present

## 2012-07-29 DIAGNOSIS — E7800 Pure hypercholesterolemia, unspecified: Secondary | ICD-10-CM | POA: Diagnosis not present

## 2012-07-29 DIAGNOSIS — G4733 Obstructive sleep apnea (adult) (pediatric): Secondary | ICD-10-CM | POA: Diagnosis not present

## 2012-07-29 DIAGNOSIS — R079 Chest pain, unspecified: Secondary | ICD-10-CM | POA: Diagnosis not present

## 2012-07-30 DIAGNOSIS — N8501 Benign endometrial hyperplasia: Secondary | ICD-10-CM | POA: Diagnosis not present

## 2012-07-30 DIAGNOSIS — C519 Malignant neoplasm of vulva, unspecified: Secondary | ICD-10-CM | POA: Diagnosis not present

## 2012-07-30 DIAGNOSIS — N926 Irregular menstruation, unspecified: Secondary | ICD-10-CM | POA: Diagnosis not present

## 2012-07-30 DIAGNOSIS — R109 Unspecified abdominal pain: Secondary | ICD-10-CM | POA: Diagnosis not present

## 2012-07-31 ENCOUNTER — Ambulatory Visit (INDEPENDENT_AMBULATORY_CARE_PROVIDER_SITE_OTHER): Payer: Medicare Other | Admitting: Family Medicine

## 2012-07-31 ENCOUNTER — Encounter: Payer: Self-pay | Admitting: Family Medicine

## 2012-07-31 VITALS — BP 152/94 | HR 108 | Ht 62.0 in | Wt 179.0 lb

## 2012-07-31 DIAGNOSIS — E785 Hyperlipidemia, unspecified: Secondary | ICD-10-CM

## 2012-07-31 DIAGNOSIS — B353 Tinea pedis: Secondary | ICD-10-CM

## 2012-07-31 DIAGNOSIS — J40 Bronchitis, not specified as acute or chronic: Secondary | ICD-10-CM

## 2012-07-31 DIAGNOSIS — R0602 Shortness of breath: Secondary | ICD-10-CM | POA: Diagnosis not present

## 2012-07-31 DIAGNOSIS — I10 Essential (primary) hypertension: Secondary | ICD-10-CM | POA: Diagnosis not present

## 2012-07-31 DIAGNOSIS — F339 Major depressive disorder, recurrent, unspecified: Secondary | ICD-10-CM | POA: Diagnosis not present

## 2012-07-31 IMAGING — CR DG CHEST 1V PORT
1 series · 1 of 1 positions shown · non-contrast
Comparison: 04/18/2011

CLINICAL DATA: Shortness of breath.  Choking sensation.

PORTABLE CHEST - 1 VIEW

[AP]
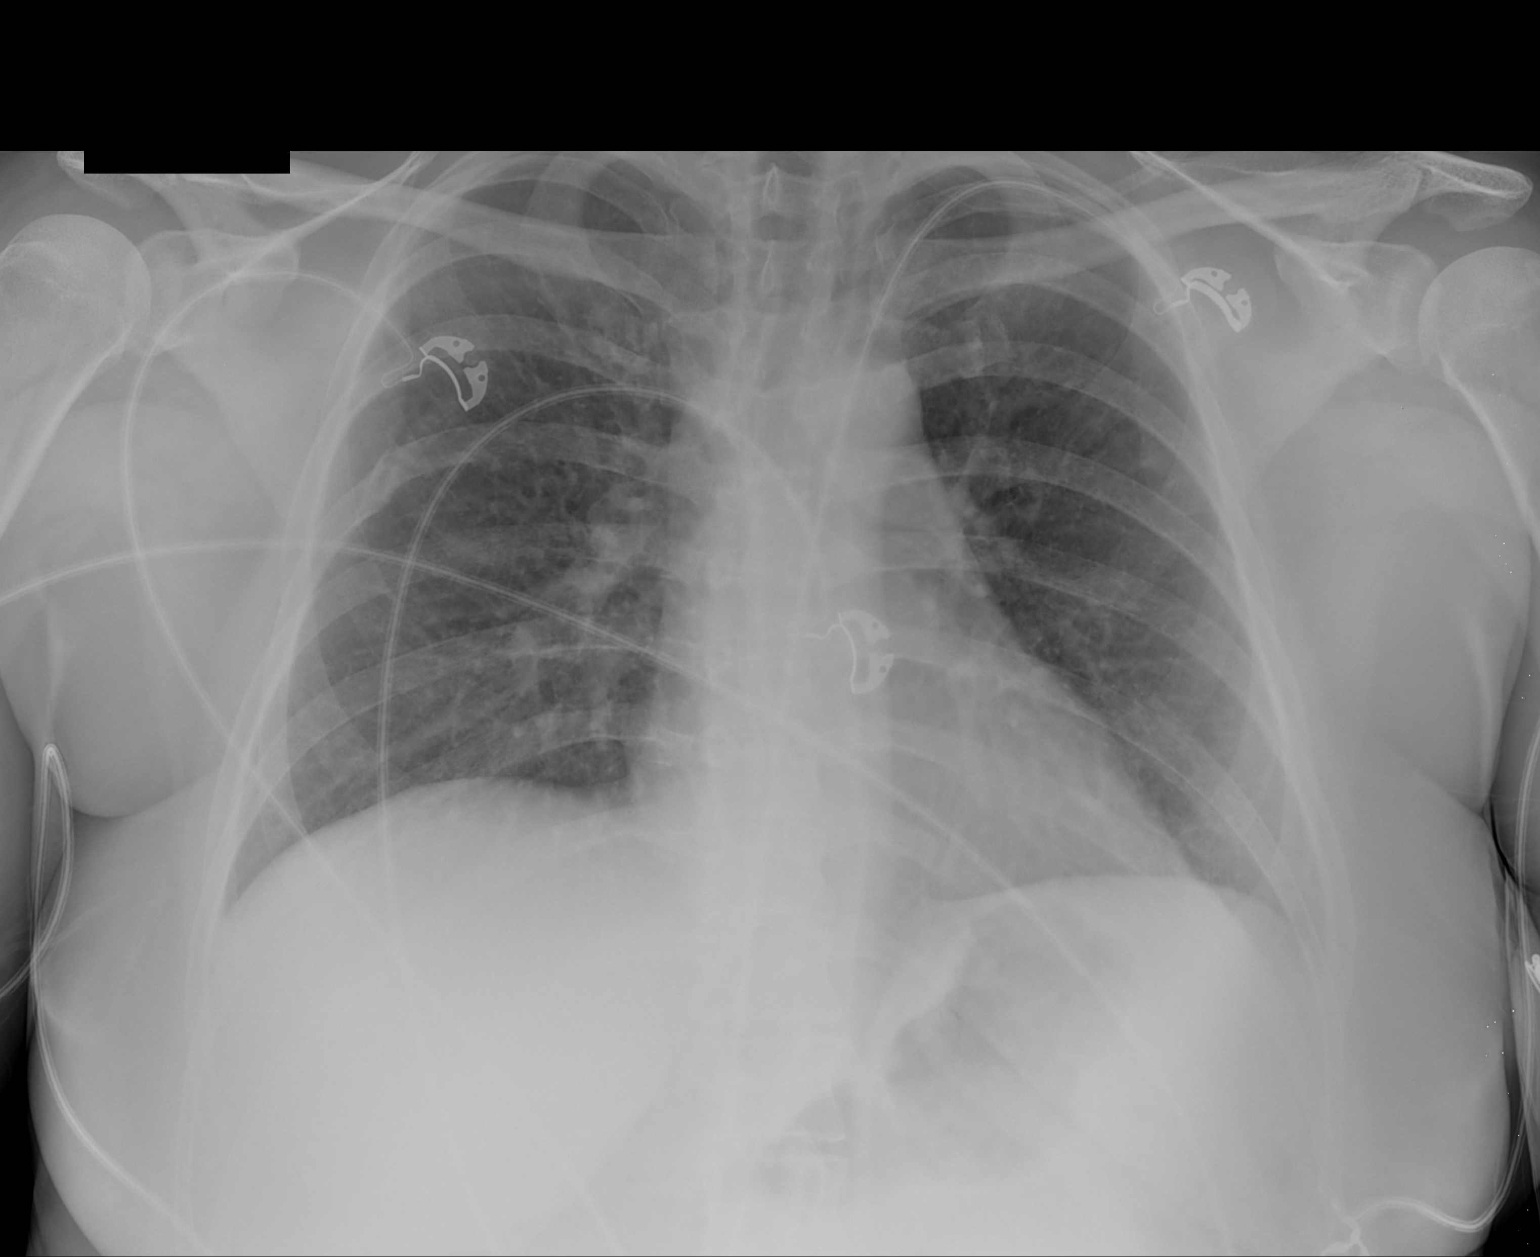

[1 of 1 positions shown; findings below may reference images not displayed]

FINDINGS: Old right sixth rib deformity noted.

Faintly accentuated interstitium of the long is probably from low
lung volumes and semi erect positioning.  No airspace opacity is
observed.  No pleural effusion is identified.

Cardiac and mediastinal contours appear unremarkable.
IMPRESSION: 1.  Faintly accentuated pulmonary interstitium is probably
technique related.
2.  Old right sixth rib deformity.

## 2012-07-31 NOTE — Progress Notes (Signed)
Subjective:    Patient ID: Jennifer [name], female    DOB: 29-Dec-1965, 46 y.o.   MRN: [id number]  HPI Has been stressed. She has been more SOB over the last several weeks.  Has noticed some wheezing but she felt better today. She tried the doxy for her bronchitis but didn't fell well. Then went to ED and they put her on keflex and says didn't fell well. They thye started her azithromycin and she got kpe swelling and SOB. She has pushed back her surgery bc of this.  She is scheduled for PFTs tomorrow.  She really need to get her surgeries done.  Says still having the really thick secreations. Right now she is afebrile. She still has a slightly productive cough but it is less frequent. They did do a repeat chest x-ray in the emergency room last week and mention some interstitial markings but otherwise no other findings.  Will coat her foot in vaseline to help with the dry skin.  She is getting red blotches and itching. She has had tinea pedis before and still has some clotrimazole home. She has not restarted it.  Hypertension-she feels that the myocarditis really doesn't help. She thinks her blood pressure still high on at times. At other times her blood pressure looks great off of the medication. She still thinking about starting is brought to watch her endocrinologist had recommended to her. She's just fearful of retrying it.  Review of Systems     Objective:   Physical Exam  Constitutional: She is oriented to person, place, and time. She appears well-developed and well-nourished.  HENT:  Head: Normocephalic and atraumatic.  Right Ear: External ear normal.  Left Ear: External ear normal.  Nose: Nose normal.  Mouth/Throat: Oropharynx is clear and moist.       TMs and canals are clear.   Eyes: Conjunctivae normal and EOM are normal. Pupils are equal, round, and reactive to light.  Neck: Neck supple. No thyromegaly present.  Cardiovascular: Normal rate, regular rhythm and normal heart sounds.    Pulmonary/Chest: Effort normal and breath sounds normal. She has no wheezes.  Lymphadenopathy:    She has no cervical adenopathy.  Neurological: She is alert and oriented to person, place, and time.  Skin: Skin is warm and dry.       Dry skin on her feet with some slight erythema along the edges of the sole of her feet bilaterally.  Psychiatric: She has a normal mood and affect.          Assessment & Plan:  HTN - Needs to take the medication consistantly, she did stop the medication temporarily because she was supposed to have surgery. I think at this point since she's going to the surgery out again and she can restart the medication.  Tine pedis - Resart her clotrimazole and continue moisturizing. She thinks she still has the prescription at home. She can restart this. She realizes she does not have anyone can certainly send in a new one for her.  Bronchitis-I. really think her bronchitis is resolving. She's had partial courses of 3 for antibiotics. She has such a significant allergy list at this point in time I would prefer not to start a new medication and just repeat a chest x-ray in one week to make sure that her lungs are clearing. She also has pulmonary function testing scheduled tomorrow through her allergist. I think is fantastic. An is important to do this before she has sedation for 3  different surgical procedures that she has scheduled in the future. I encouraged her to keep this appointment I think it's very important. Today I see no signs of active infection.   SOB- See bronchitis.   Hyperlipidemia-she says after one dose she notices that she feels a little bit more tight like her muscles are sore in her chest and arms. She also notices vision changes after one dose of the statin. I explained her that this is very unusual. I've never seen it caused vision changes and certainly most the time myalgias occur after weeks of taking the medication and not just a single dose. I  recommend that she retry it. She has been sick with bronchitis and certainly that could explain some of her symptoms that could be completely unrelated to taking the statin. I discussed in the importance of lowering her risk of heart disease and stroke which she has a very strong family history of.  Time spent 30 min, > 50% spent discussed her bronchitis and options for further course and tx, in addition to her blood pressure and cholesterol

## 2012-08-01 DIAGNOSIS — Z886 Allergy status to analgesic agent status: Secondary | ICD-10-CM | POA: Diagnosis not present

## 2012-08-01 DIAGNOSIS — Z8673 Personal history of transient ischemic attack (TIA), and cerebral infarction without residual deficits: Secondary | ICD-10-CM | POA: Diagnosis not present

## 2012-08-01 DIAGNOSIS — M719 Bursopathy, unspecified: Secondary | ICD-10-CM | POA: Diagnosis not present

## 2012-08-01 DIAGNOSIS — R059 Cough, unspecified: Secondary | ICD-10-CM | POA: Diagnosis not present

## 2012-08-01 DIAGNOSIS — R0602 Shortness of breath: Secondary | ICD-10-CM | POA: Diagnosis not present

## 2012-08-01 DIAGNOSIS — F339 Major depressive disorder, recurrent, unspecified: Secondary | ICD-10-CM | POA: Diagnosis not present

## 2012-08-01 DIAGNOSIS — E876 Hypokalemia: Secondary | ICD-10-CM | POA: Diagnosis not present

## 2012-08-01 DIAGNOSIS — M67919 Unspecified disorder of synovium and tendon, unspecified shoulder: Secondary | ICD-10-CM | POA: Diagnosis not present

## 2012-08-01 DIAGNOSIS — I10 Essential (primary) hypertension: Secondary | ICD-10-CM | POA: Diagnosis not present

## 2012-08-01 DIAGNOSIS — Z79899 Other long term (current) drug therapy: Secondary | ICD-10-CM | POA: Diagnosis not present

## 2012-08-01 DIAGNOSIS — R05 Cough: Secondary | ICD-10-CM | POA: Diagnosis not present

## 2012-08-01 DIAGNOSIS — M25519 Pain in unspecified shoulder: Secondary | ICD-10-CM | POA: Diagnosis not present

## 2012-08-01 LAB — PULMONARY FUNCTION TEST

## 2012-08-05 ENCOUNTER — Ambulatory Visit (INDEPENDENT_AMBULATORY_CARE_PROVIDER_SITE_OTHER): Payer: Medicare Other

## 2012-08-05 ENCOUNTER — Ambulatory Visit: Payer: Medicare Other

## 2012-08-05 ENCOUNTER — Telehealth: Payer: Self-pay | Admitting: *Deleted

## 2012-08-05 ENCOUNTER — Other Ambulatory Visit: Payer: Self-pay | Admitting: Family Medicine

## 2012-08-05 DIAGNOSIS — J40 Bronchitis, not specified as acute or chronic: Secondary | ICD-10-CM

## 2012-08-05 DIAGNOSIS — D539 Nutritional anemia, unspecified: Secondary | ICD-10-CM | POA: Diagnosis not present

## 2012-08-05 DIAGNOSIS — R0989 Other specified symptoms and signs involving the circulatory and respiratory systems: Secondary | ICD-10-CM

## 2012-08-05 DIAGNOSIS — R059 Cough, unspecified: Secondary | ICD-10-CM

## 2012-08-05 DIAGNOSIS — F339 Major depressive disorder, recurrent, unspecified: Secondary | ICD-10-CM | POA: Diagnosis not present

## 2012-08-05 DIAGNOSIS — E876 Hypokalemia: Secondary | ICD-10-CM | POA: Diagnosis not present

## 2012-08-05 DIAGNOSIS — E785 Hyperlipidemia, unspecified: Secondary | ICD-10-CM | POA: Diagnosis not present

## 2012-08-05 DIAGNOSIS — R52 Pain, unspecified: Secondary | ICD-10-CM

## 2012-08-05 DIAGNOSIS — R05 Cough: Secondary | ICD-10-CM | POA: Diagnosis not present

## 2012-08-05 LAB — COMPREHENSIVE METABOLIC PANEL
ALT: 21 U/L (ref 0–35)
AST: 14 U/L (ref 0–37)
Albumin: 4 g/dL (ref 3.5–5.2)
Alkaline Phosphatase: 57 U/L (ref 39–117)
BUN: 11 mg/dL (ref 6–23)
CO2: 26 mEq/L (ref 19–32)
Calcium: 9 mg/dL (ref 8.4–10.5)
Chloride: 106 mEq/L (ref 96–112)
Creat: 0.57 mg/dL (ref 0.50–1.10)
Glucose, Bld: 95 mg/dL (ref 70–99)
Potassium: 4.1 mEq/L (ref 3.5–5.3)
Sodium: 138 mEq/L (ref 135–145)
Total Bilirubin: 0.4 mg/dL (ref 0.3–1.2)
Total Protein: 6.6 g/dL (ref 6.0–8.3)

## 2012-08-05 LAB — LIPID PANEL
Cholesterol: 176 mg/dL (ref 0–200)
HDL: 41 mg/dL (ref >39)
LDL Cholesterol: 110 mg/dL — ABNORMAL HIGH (ref 0–99)
Total CHOL/HDL Ratio: 4.3 Ratio
Triglycerides: 126 mg/dL (ref <150)
VLDL: 25 mg/dL (ref 0–40)

## 2012-08-05 LAB — IRON AND TIBC
%SAT: 29 % (ref 20–55)
Iron: 77 ug/dL (ref 42–145)
TIBC: 263 ug/dL (ref 250–470)
UIBC: 186 ug/dL (ref 125–400)

## 2012-08-05 NOTE — Telephone Encounter (Signed)
Pt walked in office wanting to have her potassium re checked as it was low at last draw and iron checked. Labs entered

## 2012-08-10 IMAGING — CR DG CHEST 2V
2 series · 2 of 2 positions shown · non-contrast
Comparison: Chest x-ray 05/08/2011.

CLINICAL DATA: Shortness of breath.

CHEST - 2 VIEW

[w chest pa]
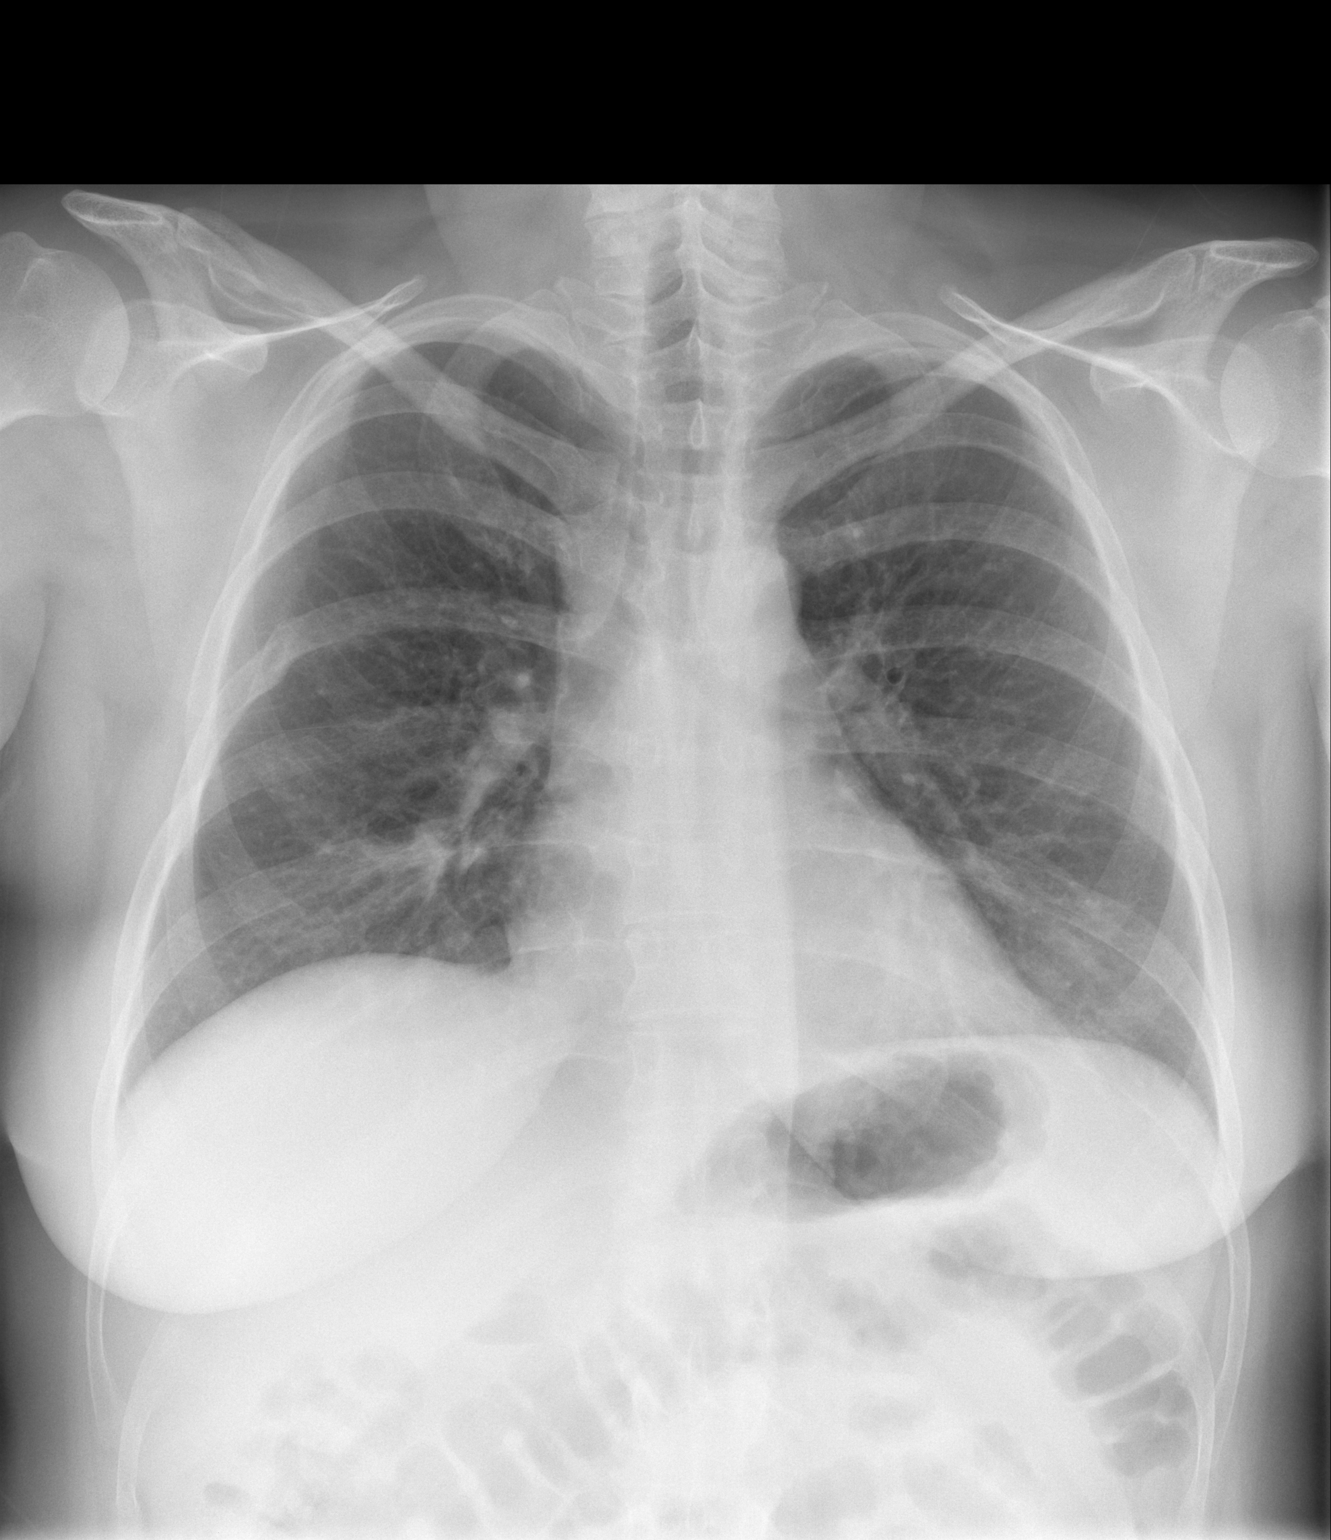

[w chest lat]
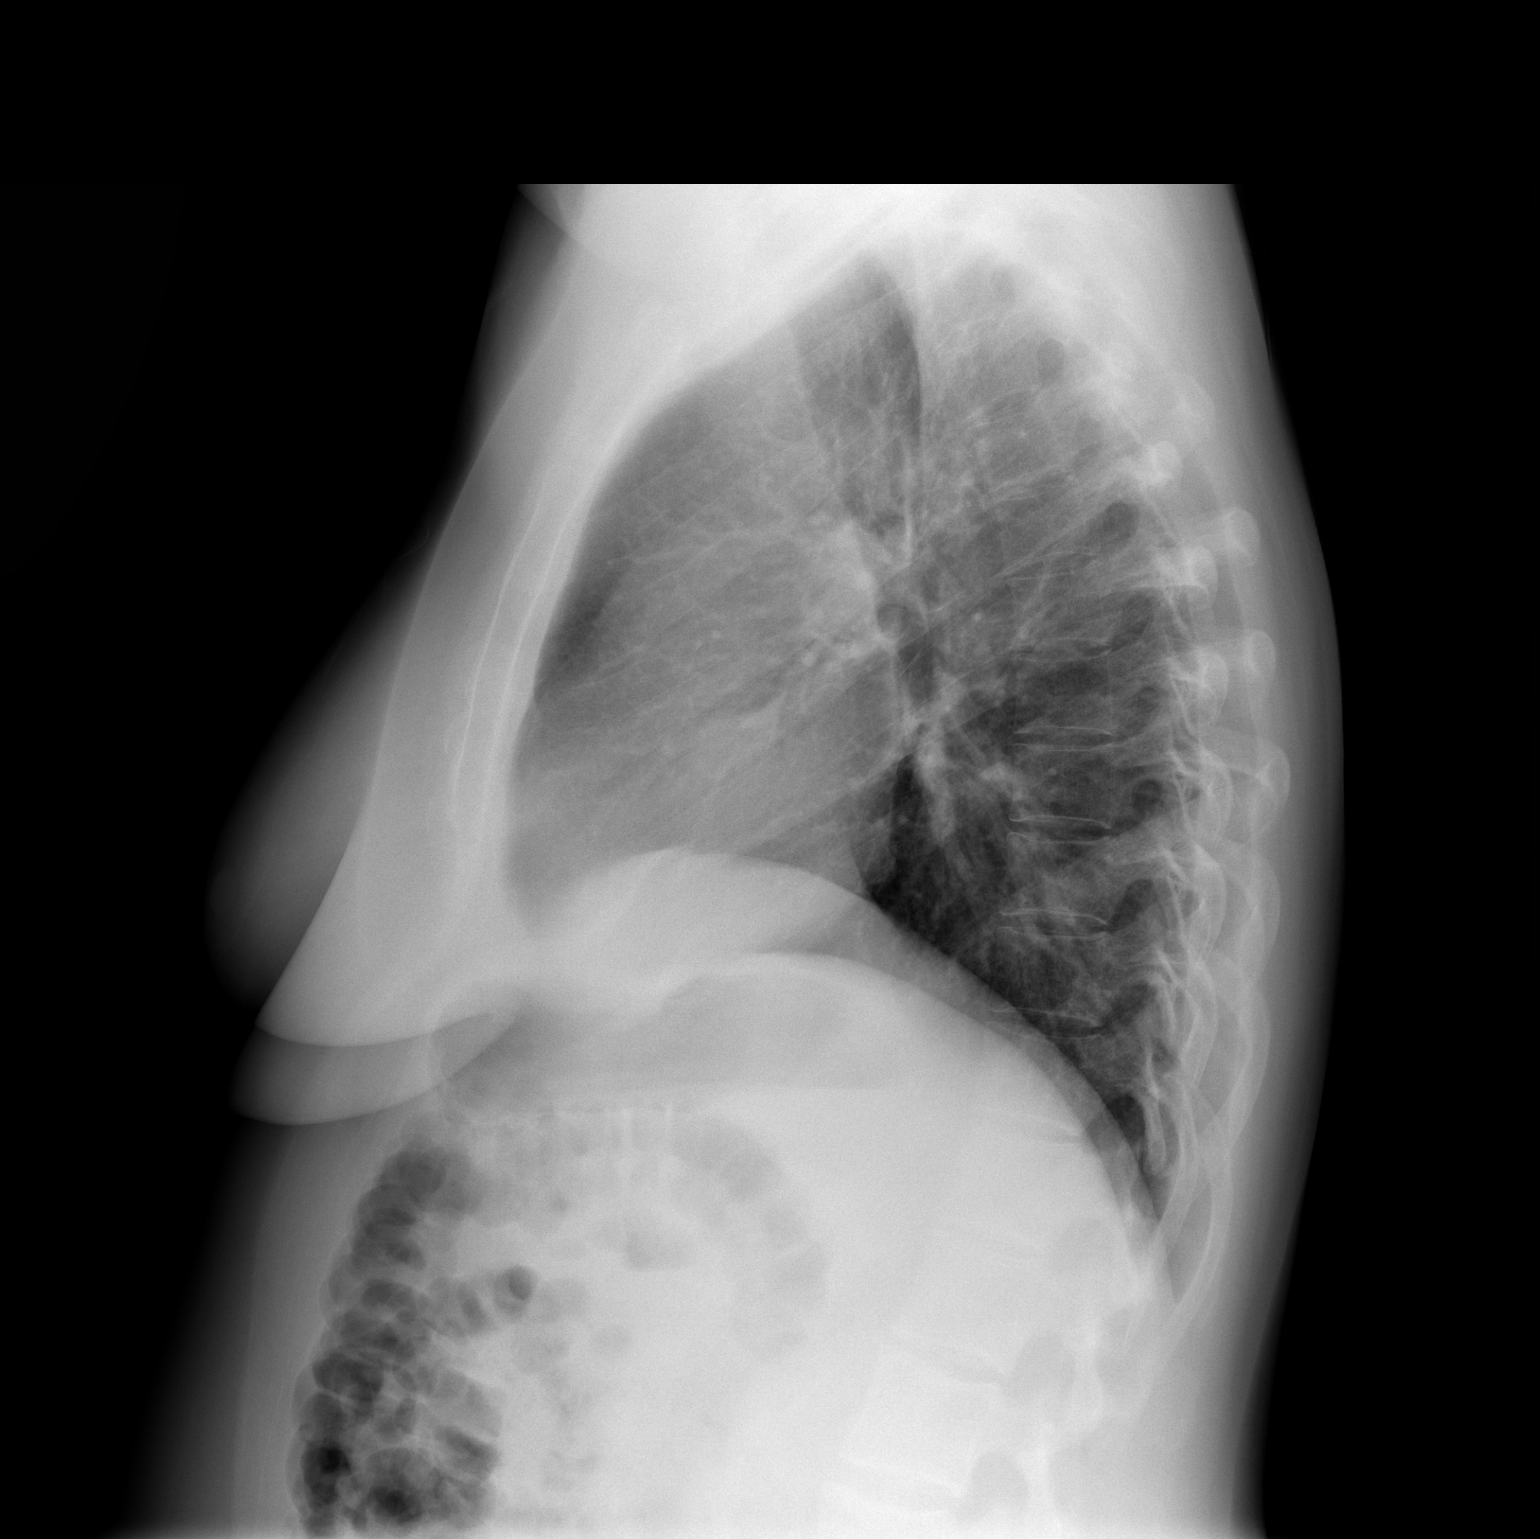

[2 of 2 positions shown; findings below may reference images not displayed]

FINDINGS: The cardiac silhouette, mediastinal and hilar contours
are within normal limits and stable.  The lungs are clear.  No
pleural effusion.  The bony thorax is intact.  A remote healed
sixth rib fracture is noted on the right.
IMPRESSION: No acute cardiopulmonary findings.

## 2012-08-12 DIAGNOSIS — Z9851 Tubal ligation status: Secondary | ICD-10-CM | POA: Diagnosis not present

## 2012-08-12 DIAGNOSIS — Z9861 Coronary angioplasty status: Secondary | ICD-10-CM | POA: Diagnosis not present

## 2012-08-12 DIAGNOSIS — D649 Anemia, unspecified: Secondary | ICD-10-CM | POA: Diagnosis not present

## 2012-08-12 DIAGNOSIS — Z9109 Other allergy status, other than to drugs and biological substances: Secondary | ICD-10-CM | POA: Diagnosis not present

## 2012-08-12 DIAGNOSIS — E269 Hyperaldosteronism, unspecified: Secondary | ICD-10-CM | POA: Diagnosis not present

## 2012-08-12 DIAGNOSIS — Z885 Allergy status to narcotic agent status: Secondary | ICD-10-CM | POA: Diagnosis not present

## 2012-08-12 DIAGNOSIS — R110 Nausea: Secondary | ICD-10-CM | POA: Diagnosis not present

## 2012-08-12 DIAGNOSIS — Z886 Allergy status to analgesic agent status: Secondary | ICD-10-CM | POA: Diagnosis not present

## 2012-08-12 DIAGNOSIS — Z9089 Acquired absence of other organs: Secondary | ICD-10-CM | POA: Diagnosis not present

## 2012-08-12 DIAGNOSIS — F458 Other somatoform disorders: Secondary | ICD-10-CM | POA: Diagnosis not present

## 2012-08-12 DIAGNOSIS — M889 Osteitis deformans of unspecified bone: Secondary | ICD-10-CM | POA: Diagnosis not present

## 2012-08-12 DIAGNOSIS — R05 Cough: Secondary | ICD-10-CM | POA: Diagnosis not present

## 2012-08-12 DIAGNOSIS — J309 Allergic rhinitis, unspecified: Secondary | ICD-10-CM | POA: Diagnosis not present

## 2012-08-12 DIAGNOSIS — F411 Generalized anxiety disorder: Secondary | ICD-10-CM | POA: Diagnosis not present

## 2012-08-12 DIAGNOSIS — Z79899 Other long term (current) drug therapy: Secondary | ICD-10-CM | POA: Diagnosis not present

## 2012-08-12 DIAGNOSIS — R064 Hyperventilation: Secondary | ICD-10-CM | POA: Diagnosis not present

## 2012-08-12 DIAGNOSIS — R109 Unspecified abdominal pain: Secondary | ICD-10-CM | POA: Diagnosis not present

## 2012-08-12 DIAGNOSIS — Z888 Allergy status to other drugs, medicaments and biological substances status: Secondary | ICD-10-CM | POA: Diagnosis not present

## 2012-08-12 DIAGNOSIS — R059 Cough, unspecified: Secondary | ICD-10-CM | POA: Diagnosis not present

## 2012-08-12 DIAGNOSIS — Z87891 Personal history of nicotine dependence: Secondary | ICD-10-CM | POA: Diagnosis not present

## 2012-08-12 DIAGNOSIS — Z880 Allergy status to penicillin: Secondary | ICD-10-CM | POA: Diagnosis not present

## 2012-08-12 DIAGNOSIS — Z881 Allergy status to other antibiotic agents status: Secondary | ICD-10-CM | POA: Diagnosis not present

## 2012-08-12 DIAGNOSIS — R0602 Shortness of breath: Secondary | ICD-10-CM | POA: Diagnosis not present

## 2012-08-13 ENCOUNTER — Encounter: Payer: Self-pay | Admitting: Family Medicine

## 2012-08-13 ENCOUNTER — Ambulatory Visit (INDEPENDENT_AMBULATORY_CARE_PROVIDER_SITE_OTHER): Payer: Medicare Other | Admitting: Family Medicine

## 2012-08-13 VITALS — BP 137/84 | HR 86 | Temp 98.5°F | Resp 18

## 2012-08-13 DIAGNOSIS — M2550 Pain in unspecified joint: Secondary | ICD-10-CM

## 2012-08-13 DIAGNOSIS — E785 Hyperlipidemia, unspecified: Secondary | ICD-10-CM

## 2012-08-13 DIAGNOSIS — J42 Unspecified chronic bronchitis: Secondary | ICD-10-CM

## 2012-08-13 DIAGNOSIS — F430 Acute stress reaction: Secondary | ICD-10-CM

## 2012-08-13 DIAGNOSIS — D071 Carcinoma in situ of vulva: Secondary | ICD-10-CM

## 2012-08-13 DIAGNOSIS — D509 Iron deficiency anemia, unspecified: Secondary | ICD-10-CM | POA: Diagnosis not present

## 2012-08-13 DIAGNOSIS — IMO0001 Reserved for inherently not codable concepts without codable children: Secondary | ICD-10-CM

## 2012-08-13 NOTE — Progress Notes (Signed)
Subjective:    Patient ID: Jennifer [name], female    DOB: November 12, 1965, 46 y.o.   MRN: [id number]  HPI Still has cough. Seen 2 weeks ago for f/u bronchitis and was getting better at the time so we held off on other treatment. She is still not feeling well.  Has been to the ED again.  They tried to give her steroids and Ativan.  Still getting productive sputum.  She is still having difficulty with food getting stuck in her throat and then vomiting. She is still to be rescheduled for Botox injections in her posterior pharynx. She's not rescheduled them yet.  She still has questions about pulmonology. Evidently she did have recent PFTs performed. Am not exactly sure who ordered these for her but I do not think it was a pulmonologist. It may have even been her allergist. Essentially they were normal but she got copy and would like to go over the results today. She still has times where she feels it's difficult to breathe and she gets more short of breath.  Still under a lot of pressure. Marriage is shakey right now. She is still seeing her therapist and actually saw her psychiatrist recently. She's not currently on any type of new medications. She does have Ativan at home to take on an as-needed basis but uses it very rarely. She said she did have an event where she got very angry at the emergency department recently. She says they tried to give her one of the medications, Solu-Medrol, that is on her allergy list. And had noted that she had refused medications and noting that was actually an allergy. She became irate to the point that she actually raise her voice. Evidently the nurse practitioner that she saw a refused to see her again.  Jennifer [name] f/u for her paget's on Thrusday.  She is also getting her third iron infusion on Thursday as well.  Hyperlipidemia-she did go back to repeat her blood work. Her LDL came down to 110. The she reports she really wasn't taking the statin. Thus she wants to know why it  would improve so much without the medication. She is really not changed her diet or her activity level.  She did note some arthralgias in her hips, knees and feet that started yesterday. She has been around some family members that have been sick. She's not had any cold-type symptoms.  Review of Systems     Objective:   Physical Exam  Constitutional: She is oriented to person, place, and time. She appears well-developed and well-nourished.  HENT:  Head: Normocephalic and atraumatic.  Right Ear: External ear normal.  Left Ear: External ear normal.  Nose: Nose normal.  Mouth/Throat: Oropharynx is clear and moist.       TMs and canals are clear.   Eyes: Conjunctivae normal and EOM are normal. Pupils are equal, round, and reactive to light.  Neck: Neck supple. No thyromegaly present.  Cardiovascular: Normal rate, regular rhythm and normal heart sounds.   Pulmonary/Chest: Effort normal and breath sounds normal. She has no wheezes.  Lymphadenopathy:    She has no cervical adenopathy.  Neurological: She is alert and oriented to person, place, and time.  Skin: Skin is warm and dry.  Psychiatric: She has a normal mood and affect.          Assessment & Plan:  Chronic Bronchitis -at this point in time I see no signs of acute infection. She's not short of breath, her pulse ox  is reassuring, she has no fever. She otherwise feels okay today. I believe the majority of her problems are from excess mucus production. I believe she gets a lot of mucus in the bronchial tubes which causes some shortness of breath and wheezing at times. I think it also causes of her choking and gagging and difficulty with swallowing. She says ENT in the past, has commented that she does have a lot of secretions.. Discussed using a flutter valve to help move mucous, but causing vibration the chest. Regular and activity and exercise will help as well. Continue with nasal saline rinses. Hold off on an Rx at this point in  time..  I. did review her spirometry results with her. They look fantastic her results were normal. Her ratio was 90% and her FEV1 was 99%. Her FVC was 89%. She says she actually felt short of breath that day. Thus I tried to reassure her that even though she was feeling short of breath that her pulmonary function test was fantastic. I again tried to reassure her and explained her that I think a lot of her symptoms are related to excess he is production.  Hyperlipidemia- she would like to recheck her LDL one more time to confirm if it really has decreased on its own before starting the statin. She still is very [employer] and has a lot of fears about starting a statin. Hurts the only thing that will reduce her risk of heart attack and stroke. I'm happy to recheck her levels one more time. She noted that on Thursday when she goes for her bloodwork for her iron. They can fax the results to me and then we can make a decision then whether not to try starting a statin.  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 Next  Iron deficiency-she's scheduled for a third iron transfusion on Thursday.  I really think her arthralgias related to her fibromyalgia. I see no reason to believe that she has a blood clot and reassured her of that.  Acute situational Verne Spurr is still seeing her psychiatrist and therapist regularly.  Time spent 45 minutes, greater than 50% spent discussing her chronic bronchitis and cholesterol.

## 2012-08-13 NOTE — Patient Instructions (Signed)
Work on moving sputum out of your chest.  Continue the nasal rinses.

## 2012-08-15 DIAGNOSIS — F339 Major depressive disorder, recurrent, unspecified: Secondary | ICD-10-CM | POA: Diagnosis not present

## 2012-08-16 ENCOUNTER — Emergency Department (HOSPITAL_BASED_OUTPATIENT_CLINIC_OR_DEPARTMENT_OTHER)
Admission: EM | Admit: 2012-08-16 | Discharge: 2012-08-16 | Disposition: A | Discharge status: Home / Self Care | Payer: Medicare Other | Attending: Emergency Medicine | Admitting: Emergency Medicine

## 2012-08-16 ENCOUNTER — Encounter (HOSPITAL_BASED_OUTPATIENT_CLINIC_OR_DEPARTMENT_OTHER): Payer: Self-pay | Admitting: *Deleted

## 2012-08-16 DIAGNOSIS — R110 Nausea: Secondary | ICD-10-CM

## 2012-08-16 DIAGNOSIS — E785 Hyperlipidemia, unspecified: Secondary | ICD-10-CM | POA: Insufficient documentation

## 2012-08-16 DIAGNOSIS — D649 Anemia, unspecified: Secondary | ICD-10-CM | POA: Diagnosis not present

## 2012-08-16 DIAGNOSIS — Z8679 Personal history of other diseases of the circulatory system: Secondary | ICD-10-CM | POA: Insufficient documentation

## 2012-08-16 DIAGNOSIS — I10 Essential (primary) hypertension: Secondary | ICD-10-CM | POA: Insufficient documentation

## 2012-08-16 DIAGNOSIS — Z8719 Personal history of other diseases of the digestive system: Secondary | ICD-10-CM | POA: Diagnosis not present

## 2012-08-16 DIAGNOSIS — Z8742 Personal history of other diseases of the female genital tract: Secondary | ICD-10-CM | POA: Insufficient documentation

## 2012-08-16 DIAGNOSIS — E269 Hyperaldosteronism, unspecified: Secondary | ICD-10-CM | POA: Insufficient documentation

## 2012-08-16 DIAGNOSIS — Z8669 Personal history of other diseases of the nervous system and sense organs: Secondary | ICD-10-CM | POA: Insufficient documentation

## 2012-08-16 DIAGNOSIS — Z862 Personal history of diseases of the blood and blood-forming organs and certain disorders involving the immune mechanism: Secondary | ICD-10-CM | POA: Diagnosis not present

## 2012-08-16 DIAGNOSIS — R5383 Other fatigue: Secondary | ICD-10-CM | POA: Diagnosis not present

## 2012-08-16 DIAGNOSIS — Z79899 Other long term (current) drug therapy: Secondary | ICD-10-CM | POA: Insufficient documentation

## 2012-08-16 DIAGNOSIS — Z8659 Personal history of other mental and behavioral disorders: Secondary | ICD-10-CM | POA: Diagnosis not present

## 2012-08-16 DIAGNOSIS — Z3202 Encounter for pregnancy test, result negative: Secondary | ICD-10-CM | POA: Diagnosis not present

## 2012-08-16 DIAGNOSIS — R5381 Other malaise: Secondary | ICD-10-CM | POA: Insufficient documentation

## 2012-08-16 DIAGNOSIS — G8929 Other chronic pain: Secondary | ICD-10-CM | POA: Insufficient documentation

## 2012-08-16 DIAGNOSIS — R51 Headache: Secondary | ICD-10-CM | POA: Insufficient documentation

## 2012-08-16 DIAGNOSIS — Z87891 Personal history of nicotine dependence: Secondary | ICD-10-CM | POA: Diagnosis not present

## 2012-08-16 DIAGNOSIS — R1031 Right lower quadrant pain: Secondary | ICD-10-CM | POA: Diagnosis not present

## 2012-08-16 DIAGNOSIS — M889 Osteitis deformans of unspecified bone: Secondary | ICD-10-CM | POA: Diagnosis not present

## 2012-08-16 DIAGNOSIS — J45909 Unspecified asthma, uncomplicated: Secondary | ICD-10-CM | POA: Diagnosis not present

## 2012-08-16 LAB — URINALYSIS, ROUTINE W REFLEX MICROSCOPIC
Bilirubin Urine: NEGATIVE
Glucose, UA: NEGATIVE mg/dL
Hgb urine dipstick: NEGATIVE
Ketones, ur: NEGATIVE mg/dL
Leukocytes, UA: NEGATIVE
Nitrite: NEGATIVE
Protein, ur: NEGATIVE mg/dL
Specific Gravity, Urine: 1.02 (ref 1.005–1.030)
Urobilinogen, UA: 0.2 mg/dL (ref 0.0–1.0)
pH: 7 (ref 5.0–8.0)

## 2012-08-16 LAB — CBC WITH DIFFERENTIAL/PLATELET
Basophils Absolute: 0 10*3/uL (ref 0.0–0.1)
Basophils Relative: 0 % (ref 0–1)
Eosinophils Absolute: 0.2 10*3/uL (ref 0.0–0.7)
Eosinophils Relative: 2 % (ref 0–5)
HCT: 41 % (ref 36.0–46.0)
Hemoglobin: 13.7 g/dL (ref 12.0–15.0)
Lymphocytes Relative: 19 % (ref 12–46)
Lymphs Abs: 1.3 10*3/uL (ref 0.7–4.0)
MCH: 29.4 pg (ref 26.0–34.0)
MCHC: 33.4 g/dL (ref 30.0–36.0)
MCV: 88 fL (ref 78.0–100.0)
Monocytes Absolute: 0.7 10*3/uL (ref 0.1–1.0)
Monocytes Relative: 10 % (ref 3–12)
Neutro Abs: 4.7 10*3/uL (ref 1.7–7.7)
Neutrophils Relative %: 68 % (ref 43–77)
Platelets: 235 10*3/uL (ref 150–400)
RBC: 4.66 MIL/uL (ref 3.87–5.11)
RDW: 13.9 % (ref 11.5–15.5)
WBC: 6.8 10*3/uL (ref 4.0–10.5)

## 2012-08-16 LAB — COMPREHENSIVE METABOLIC PANEL
ALT: 17 U/L (ref 0–35)
AST: 15 U/L (ref 0–37)
Albumin: 3.9 g/dL (ref 3.5–5.2)
Alkaline Phosphatase: 67 U/L (ref 39–117)
BUN: 10 mg/dL (ref 6–23)
CO2: 22 mEq/L (ref 19–32)
Calcium: 9.4 mg/dL (ref 8.4–10.5)
Chloride: 103 mEq/L (ref 96–112)
Creatinine, Ser: 0.5 mg/dL (ref 0.50–1.10)
GFR calc Af Amer: >90 mL/min (ref >90)
GFR calc non Af Amer: >90 mL/min (ref >90)
Glucose, Bld: 116 mg/dL — ABNORMAL HIGH (ref 70–99)
Potassium: 3.7 mEq/L (ref 3.5–5.1)
Sodium: 138 mEq/L (ref 135–145)
Total Bilirubin: 0.1 mg/dL — ABNORMAL LOW (ref 0.3–1.2)
Total Protein: 7.6 g/dL (ref 6.0–8.3)

## 2012-08-16 LAB — PREGNANCY, URINE: Preg Test, Ur: NEGATIVE
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
## 2012-08-16 NOTE — ED Notes (Signed)
Chills. Feels like she is not able to take a deep breath. Nausea. Has felt light headed since this am.

## 2012-08-16 NOTE — ED Notes (Addendum)
Pt anxious with pressured speech and numerous unrelated physical, emotional, and social complaints. Breathing, circulation, and neuro WNL. No specific complaint except that it is cold today. Listening and supportive care provided. Patient not receptive to RN counsel.

## 2012-08-18 DIAGNOSIS — R072 Precordial pain: Secondary | ICD-10-CM | POA: Diagnosis not present

## 2012-08-18 DIAGNOSIS — Z79899 Other long term (current) drug therapy: Secondary | ICD-10-CM | POA: Diagnosis not present

## 2012-08-18 DIAGNOSIS — I10 Essential (primary) hypertension: Secondary | ICD-10-CM | POA: Diagnosis not present

## 2012-08-18 DIAGNOSIS — G894 Chronic pain syndrome: Secondary | ICD-10-CM | POA: Diagnosis not present

## 2012-08-18 DIAGNOSIS — R079 Chest pain, unspecified: Secondary | ICD-10-CM | POA: Diagnosis not present

## 2012-08-18 DIAGNOSIS — Z8673 Personal history of transient ischemic attack (TIA), and cerebral infarction without residual deficits: Secondary | ICD-10-CM | POA: Diagnosis not present

## 2012-08-18 DIAGNOSIS — R110 Nausea: Secondary | ICD-10-CM | POA: Diagnosis not present

## 2012-08-18 NOTE — ED Provider Notes (Signed)
Medical screening examination/treatment/procedure(s) were performed by non-physician practitioner and as supervising physician I was immediately available for consultation/collaboration.  Doug Sou, MD 08/18/12 2320

## 2012-08-19 DIAGNOSIS — E785 Hyperlipidemia, unspecified: Secondary | ICD-10-CM | POA: Diagnosis not present

## 2012-08-19 DIAGNOSIS — R6889 Other general symptoms and signs: Secondary | ICD-10-CM | POA: Diagnosis not present

## 2012-08-19 DIAGNOSIS — R070 Pain in throat: Secondary | ICD-10-CM | POA: Diagnosis not present

## 2012-08-19 DIAGNOSIS — F339 Major depressive disorder, recurrent, unspecified: Secondary | ICD-10-CM | POA: Diagnosis not present

## 2012-08-19 LAB — LDL CHOLESTEROL, DIRECT: Direct LDL: 110 mg/dL — ABNORMAL HIGH

## 2012-08-20 DIAGNOSIS — F411 Generalized anxiety disorder: Secondary | ICD-10-CM | POA: Diagnosis not present

## 2012-08-20 DIAGNOSIS — R079 Chest pain, unspecified: Secondary | ICD-10-CM | POA: Diagnosis not present

## 2012-08-20 DIAGNOSIS — Z888 Allergy status to other drugs, medicaments and biological substances status: Secondary | ICD-10-CM | POA: Diagnosis not present

## 2012-08-20 DIAGNOSIS — IMO0001 Reserved for inherently not codable concepts without codable children: Secondary | ICD-10-CM | POA: Diagnosis not present

## 2012-08-20 DIAGNOSIS — R002 Palpitations: Secondary | ICD-10-CM | POA: Diagnosis not present

## 2012-08-20 DIAGNOSIS — Z79899 Other long term (current) drug therapy: Secondary | ICD-10-CM | POA: Diagnosis not present

## 2012-08-22 ENCOUNTER — Ambulatory Visit: Payer: Medicare Other | Admitting: Family Medicine

## 2012-08-22 DIAGNOSIS — R1310 Dysphagia, unspecified: Secondary | ICD-10-CM | POA: Diagnosis not present

## 2012-08-22 DIAGNOSIS — C4499 Other specified malignant neoplasm of skin, unspecified: Secondary | ICD-10-CM | POA: Diagnosis not present

## 2012-08-22 DIAGNOSIS — M79609 Pain in unspecified limb: Secondary | ICD-10-CM | POA: Diagnosis not present

## 2012-08-22 DIAGNOSIS — R0602 Shortness of breath: Secondary | ICD-10-CM | POA: Diagnosis not present

## 2012-08-22 DIAGNOSIS — R9431 Abnormal electrocardiogram [ECG] [EKG]: Secondary | ICD-10-CM | POA: Diagnosis not present

## 2012-08-22 DIAGNOSIS — R1110 Vomiting, unspecified: Secondary | ICD-10-CM | POA: Diagnosis not present

## 2012-08-22 DIAGNOSIS — R070 Pain in throat: Secondary | ICD-10-CM | POA: Diagnosis not present

## 2012-08-22 DIAGNOSIS — R109 Unspecified abdominal pain: Secondary | ICD-10-CM | POA: Diagnosis not present

## 2012-08-22 IMAGING — CR DG CHEST 2V
2 series · 2 of 2 positions shown · non-contrast
Comparison: 05/18/2011

CLINICAL DATA: Indigestion, cough.

CHEST - 2 VIEW

[w chest pa]
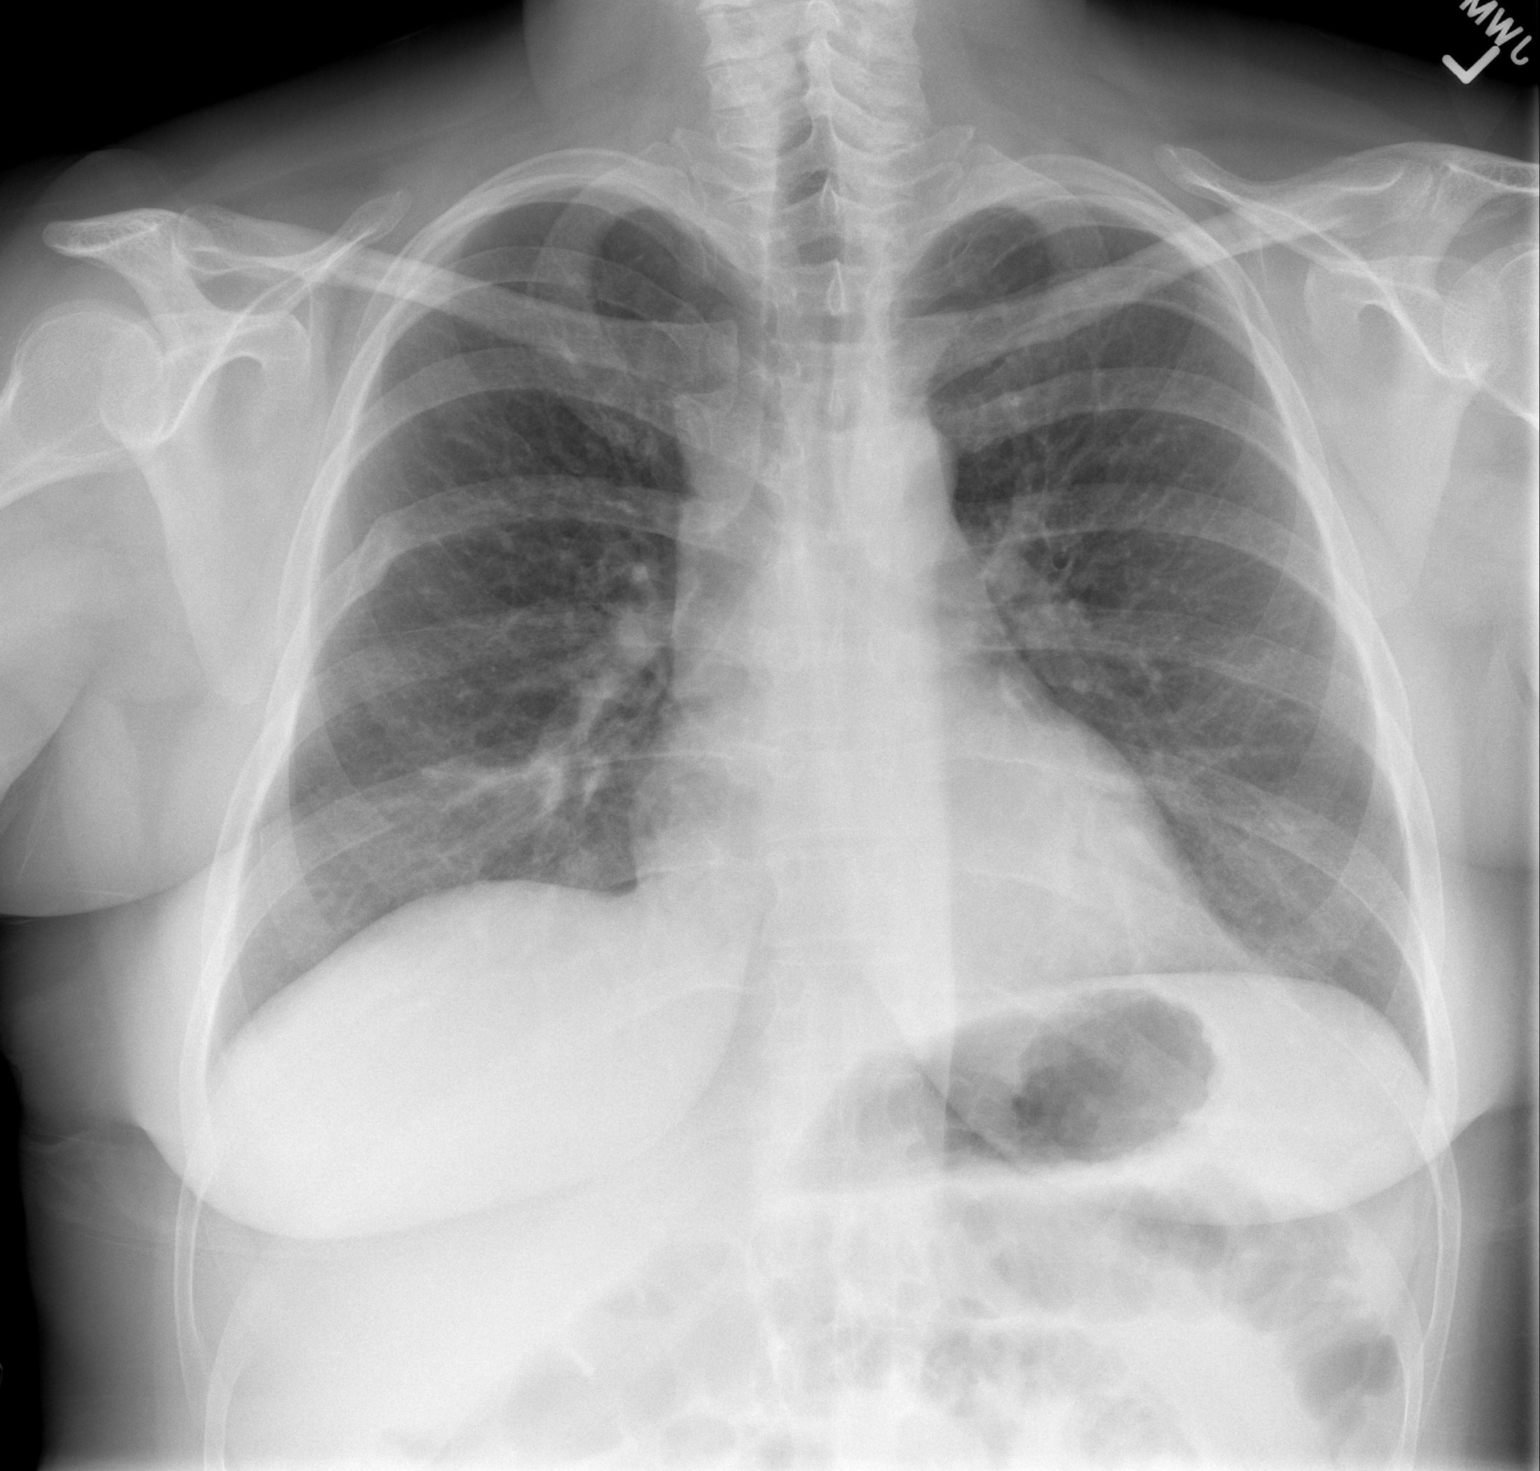

[w chest lat]
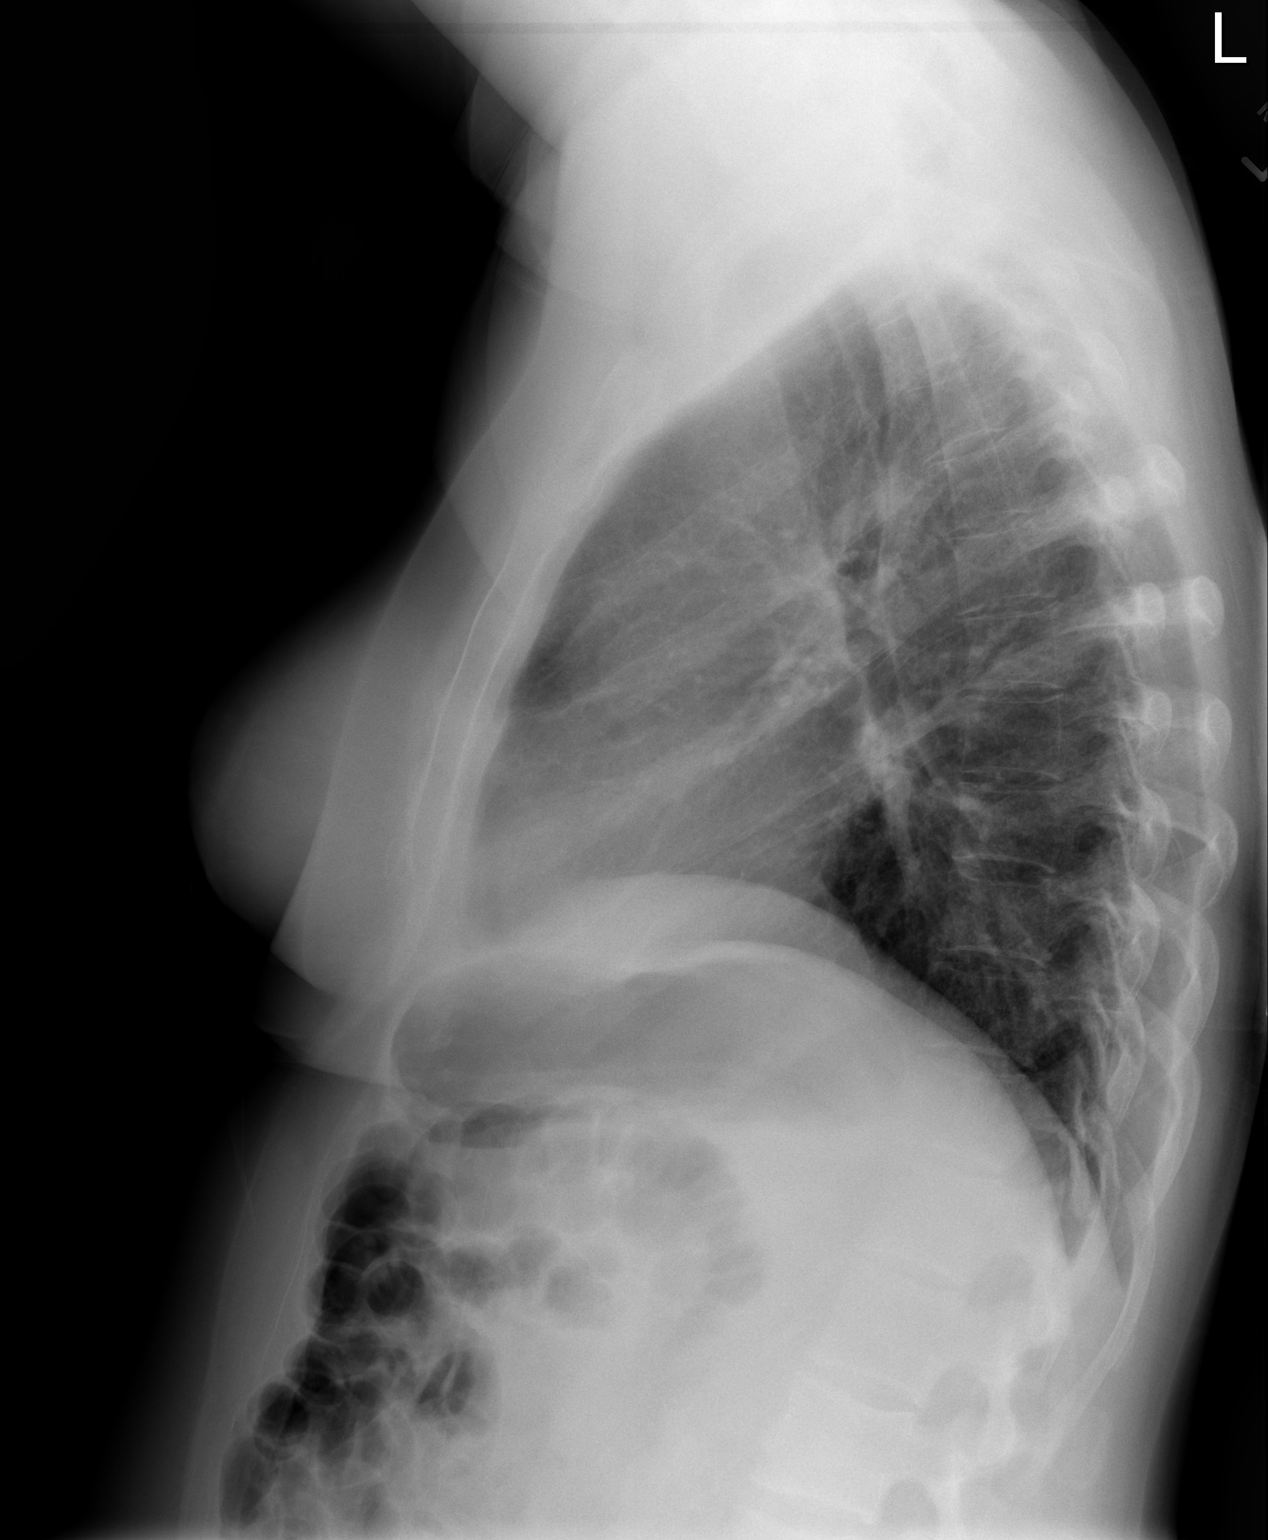

[2 of 2 positions shown; findings below may reference images not displayed]

FINDINGS: Heart is normal size.  Slight peribronchial thickening.
No confluent airspace opacities or effusions.  No change.  No acute
bony abnormality.
IMPRESSION: Slight bronchitic changes.

## 2012-08-22 IMAGING — CT CT NECK W/ CM
3 of 4 series · 13 of 33 positions shown, 16 images · IV contrast (omnipaque)
Comparison: Neck soft tissue radiograph - 04/18/2011; 10/02/2008;
neck CT - 09/18/2008 the

CLINICAL DATA: Difficulty swallowing, history of recent Botox
injection into throat

CT NECK WITH CONTRAST
TECHNIQUE: Multidetector CT imaging of the neck was performed with
intravenous contrast.
Contrast: 75mL OMNIPAQUE IOHEXOL 300 MG/ML IV SOLN

[Series 2: neck 2.0 b31s · axial · 0.38mm/px · z∈[-293,-157]mm · 5 of 102 slices shown, 7 images]
[im 17/102  soft-tissue]
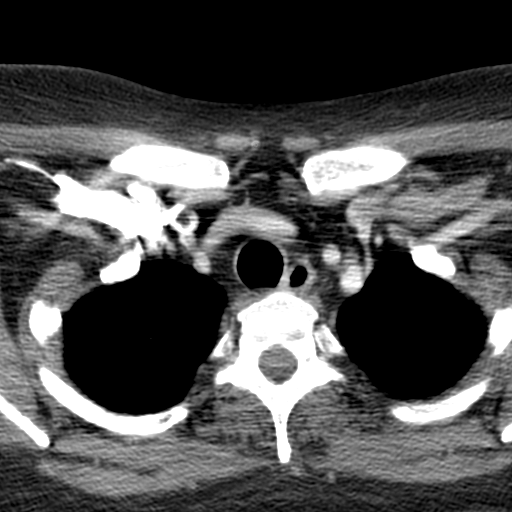
[im 17/102  bone]
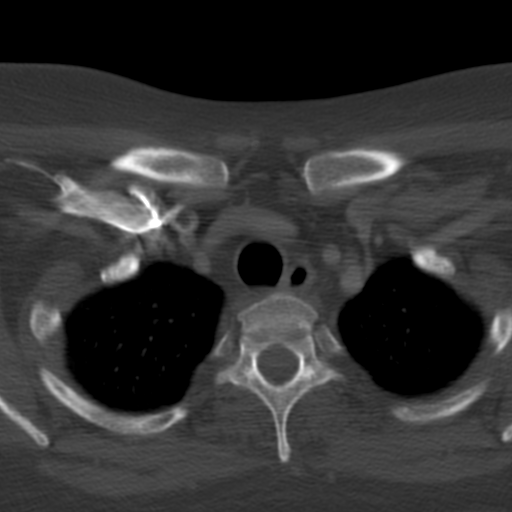
[im 34/102  bone]
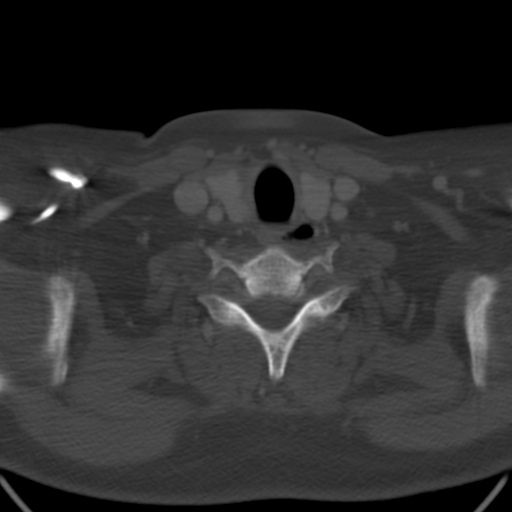
[im 51/102  bone]
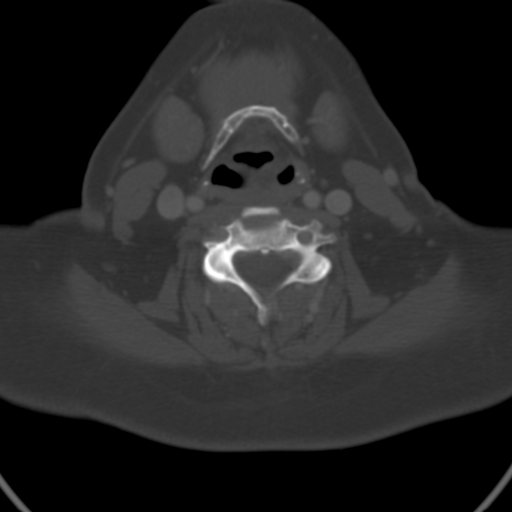
[im 68/102  bone]
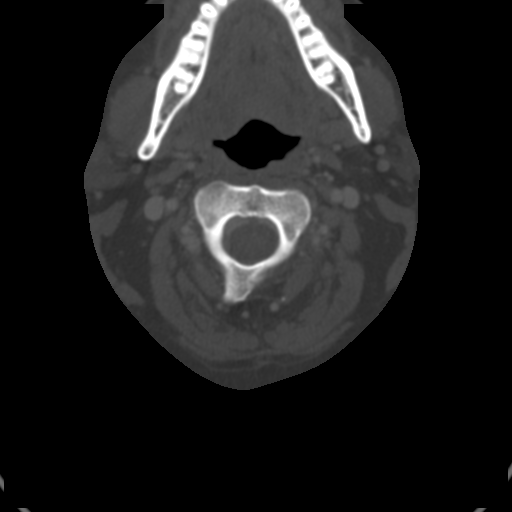
[im 85/102  soft-tissue]
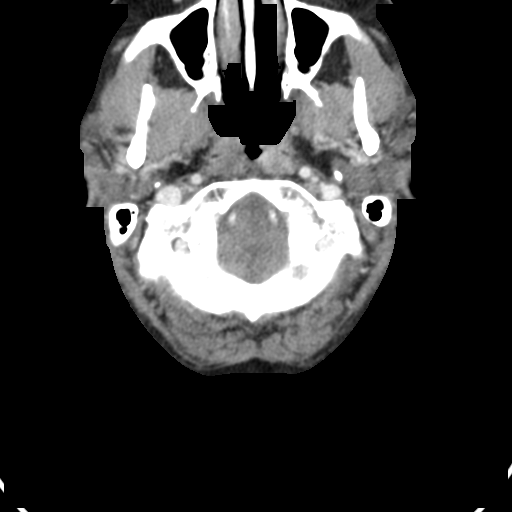
[im 85/102  bone]
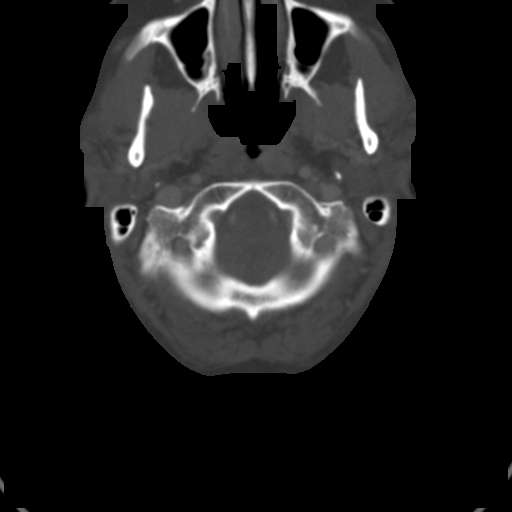

[Series 4: neck 2.0 coronal · coronal · 0.31mm/px · 3 of 90 slices shown]
[im 20/90  bone]
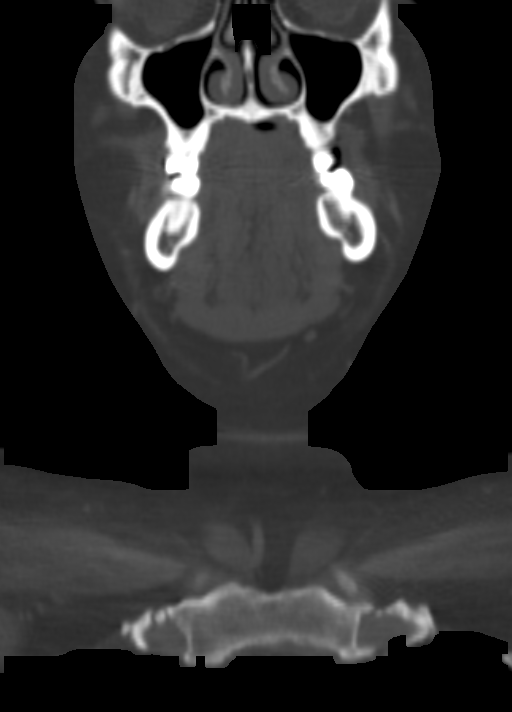
[im 37/90  bone]
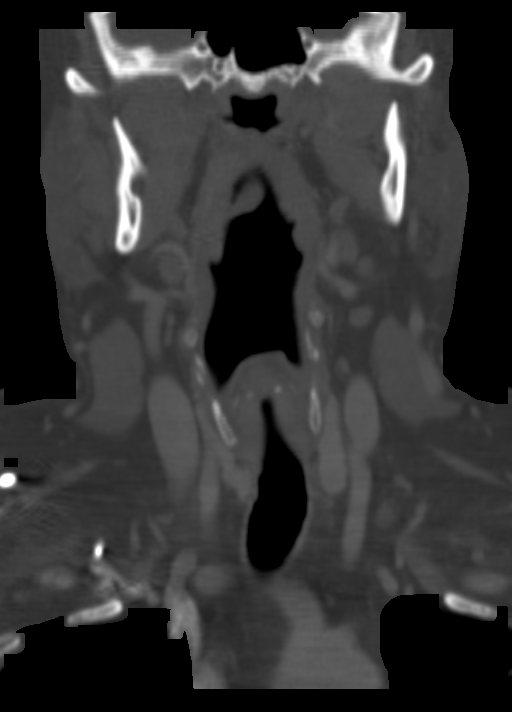
[im 54/90  bone]
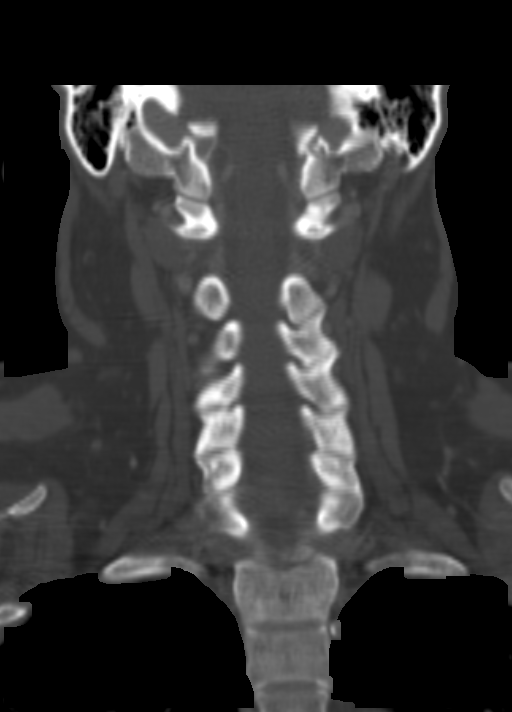

[Series 5: neck 2.0 sagittal · sagittal · 0.36mm/px · 5 of 74 slices shown, 6 images]
[im 25/74  bone]
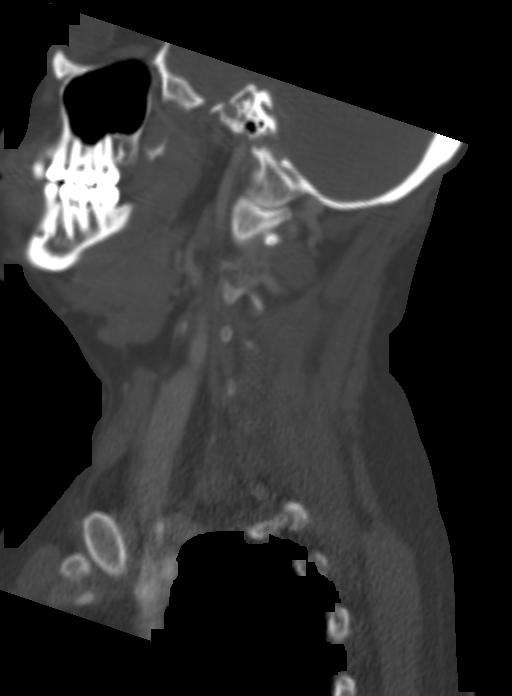
[im 31/74  bone]
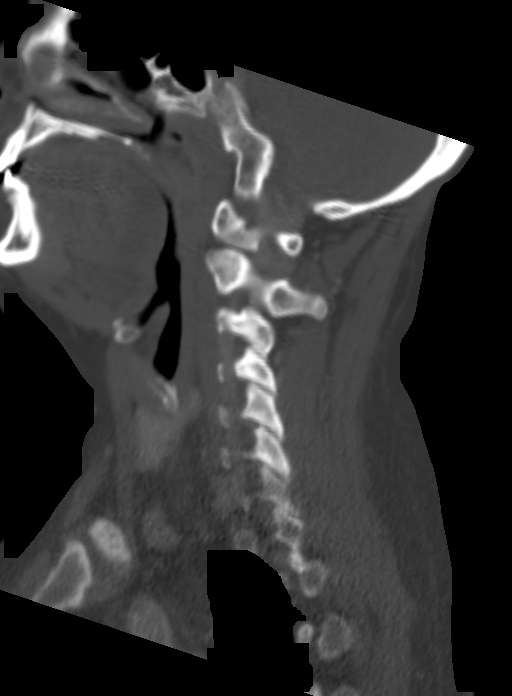
[im 37/74  soft-tissue]
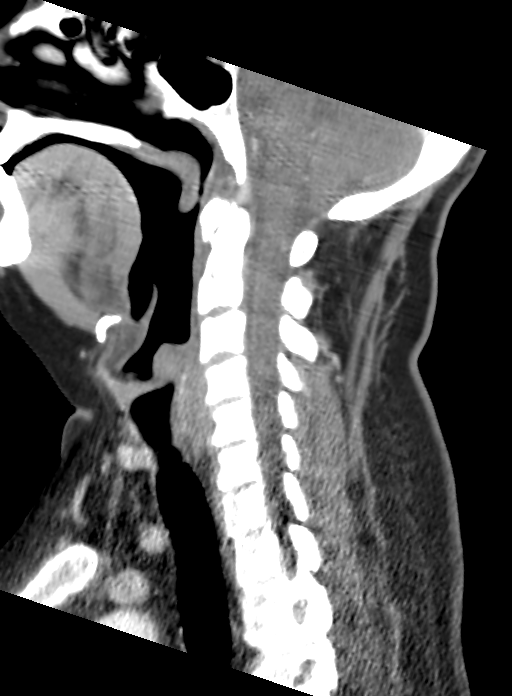
[im 37/74  bone]
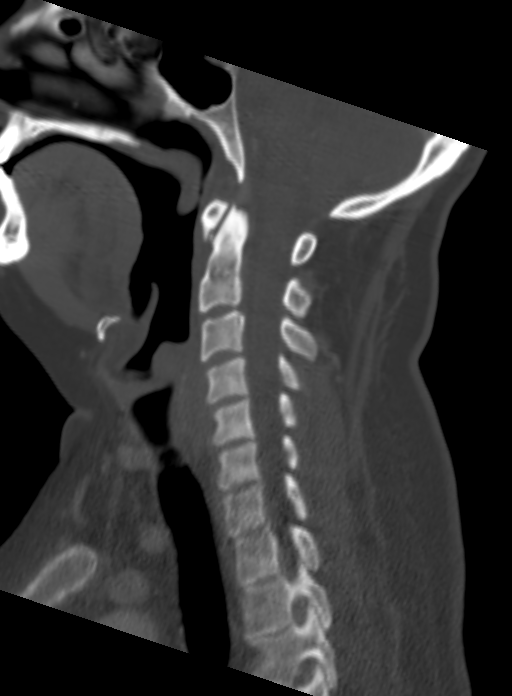
[im 43/74  bone]
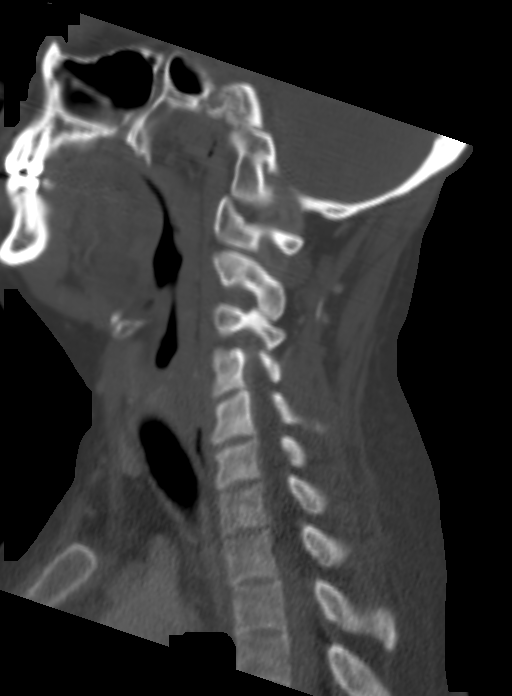
[im 49/74  bone]
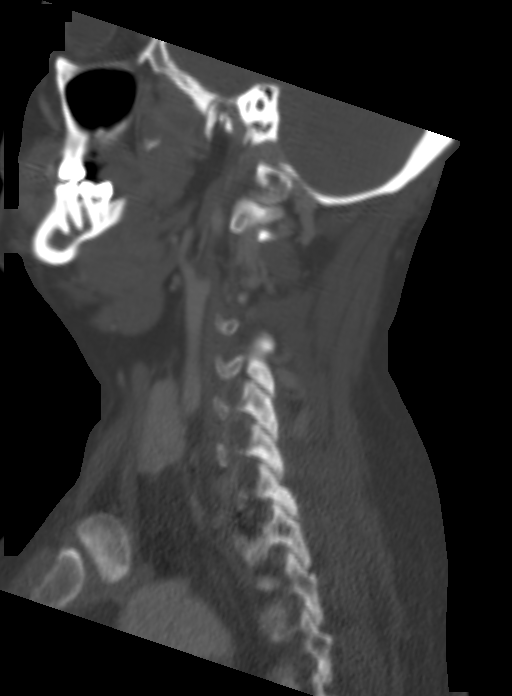

[13 of 33 positions shown; findings below may reference images not displayed]

FINDINGS: No cervical lymphadenopathy.  The prevertebral soft tissues are
normal.  Normal appearance of the pharynx and hypopharynx.  Normal
appearance of the epiglottis.  Grossly symmetric appearance of the
vallecula and bilateral aryepiglottic folds.  Symmetric appearing
parotid, submandibular and thyroid glands.

Limited visualization of the base of skull is normal. Bilateral
mastoid air cells and visualized paranasal sinuses are normal.  The
imaged intracranial structures are normal.

Limited visualization of the lung apices is normal. Normal
configuration of the aortic arch.  Cervical vasculature is patent.
Regional soft tissues are normal.  There is minimal, unchanged
degenerate change of the atlanto-odontoid articulation.  The dens
is normally seen between the lateral masses of C1.  Normal
atlantoaxial articulation.  Normal alignment of the cervical spine.
No anterolisthesis or retrolisthesis.
IMPRESSION: Negative examination.  Specifically, no explanation for the
patient's dysphagia.

## 2012-08-23 ENCOUNTER — Telehealth: Payer: Self-pay

## 2012-08-23 NOTE — Telephone Encounter (Signed)
Patient advised of labs/LDL and mailed a copy.

## 2012-08-27 ENCOUNTER — Encounter: Payer: Self-pay | Admitting: *Deleted

## 2012-08-27 ENCOUNTER — Emergency Department (INDEPENDENT_AMBULATORY_CARE_PROVIDER_SITE_OTHER): Payer: Medicare Other

## 2012-08-27 ENCOUNTER — Emergency Department (INDEPENDENT_AMBULATORY_CARE_PROVIDER_SITE_OTHER)
Admission: EM | Admit: 2012-08-27 | Discharge: 2012-08-27 | Disposition: A | Discharge status: Home / Self Care | Payer: Medicare Other | Source: Home / Self Care | Attending: Family Medicine | Admitting: Family Medicine

## 2012-08-27 ENCOUNTER — Emergency Department
Admission: EM | Admit: 2012-08-27 | Discharge: 2012-08-27 | Disposition: A | Discharge status: Home / Self Care | Payer: Medicare Other | Source: Home / Self Care

## 2012-08-27 DIAGNOSIS — R059 Cough, unspecified: Secondary | ICD-10-CM

## 2012-08-27 DIAGNOSIS — R05 Cough: Secondary | ICD-10-CM

## 2012-08-27 DIAGNOSIS — M940 Chondrocostal junction syndrome [Tietze]: Secondary | ICD-10-CM | POA: Diagnosis not present

## 2012-08-27 DIAGNOSIS — R079 Chest pain, unspecified: Secondary | ICD-10-CM | POA: Diagnosis not present

## 2012-08-27 NOTE — ED Notes (Signed)
Patient c/o tightness across her chest and back with deep breaths and cough. She had bronchitis one month ago. Still producing thick mucous at times.
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
## 2012-08-29 DIAGNOSIS — I10 Essential (primary) hypertension: Secondary | ICD-10-CM | POA: Diagnosis not present

## 2012-08-29 DIAGNOSIS — Z79899 Other long term (current) drug therapy: Secondary | ICD-10-CM | POA: Diagnosis not present

## 2012-08-29 DIAGNOSIS — G8929 Other chronic pain: Secondary | ICD-10-CM | POA: Diagnosis not present

## 2012-08-29 DIAGNOSIS — J312 Chronic pharyngitis: Secondary | ICD-10-CM | POA: Diagnosis not present

## 2012-08-29 DIAGNOSIS — F339 Major depressive disorder, recurrent, unspecified: Secondary | ICD-10-CM | POA: Diagnosis not present

## 2012-08-29 DIAGNOSIS — J029 Acute pharyngitis, unspecified: Secondary | ICD-10-CM | POA: Diagnosis not present

## 2012-08-29 DIAGNOSIS — R1310 Dysphagia, unspecified: Secondary | ICD-10-CM | POA: Diagnosis not present

## 2012-08-30 ENCOUNTER — Encounter: Payer: Self-pay | Admitting: Family Medicine

## 2012-08-30 ENCOUNTER — Ambulatory Visit: Payer: Medicare Other | Admitting: Family Medicine

## 2012-08-30 ENCOUNTER — Ambulatory Visit (INDEPENDENT_AMBULATORY_CARE_PROVIDER_SITE_OTHER): Payer: Medicare Other | Admitting: Family Medicine

## 2012-08-30 VITALS — BP 126/64 | HR 118 | Wt 178.0 lb

## 2012-08-30 DIAGNOSIS — R1310 Dysphagia, unspecified: Secondary | ICD-10-CM | POA: Diagnosis not present

## 2012-08-30 DIAGNOSIS — J029 Acute pharyngitis, unspecified: Secondary | ICD-10-CM

## 2012-08-30 LAB — POCT RAPID STREP A (OFFICE): Rapid Strep A Screen: NEGATIVE

## 2012-08-30 NOTE — Patient Instructions (Signed)
Clear Liquid Diet °The clear liquid diet consists of foods that are liquid or will become liquid at room temperature. You should be able to see through the liquid and beverages. Examples of foods allowed on a clear liquid diet include fruit juice, broth or bouillon, gelatin, or frozen ice pops. °The purpose of this diet is to provide necessary fluid, electrolytes such as sodium and potassium, and energy to keep the body functioning during times when you are not able to consume a regular diet. A clear liquid diet should not be continued for long periods of time as it is not nutritionally adequate.  °REASONS FOR USING A CLEAR LIQUID DIET °· In sudden onset (acute) conditions for a patient before or after surgery. °· As the first step in oral feeding. °· For fluid and electrolyte replacement in diarrheal diseases. °· As a diet before certain medical tests are performed. °ADEQUACY °The clear liquid diet is adequate only in ascorbic acid, according to the Recommended Dietary Allowances of the National Research Council. °CHOOSING FOODS °Breads and Starches °· Allowed:  None are allowed. °· Avoid: All are avoided. °Vegetables °· Allowed:  Strained tomato or vegetable juice. °· Avoid: Any others. °Fruit °· Allowed:  Strained fruit juices and fruit drinks. Include 1 serving of citrus or vitamin C-enriched fruit juice daily. °· Avoid: Any others. °Meat and Meat Substitutes °· Allowed:  None are allowed. °· Avoid: All are avoided. °Milk °· Allowed:  None are allowed. °· Avoid: All are avoided. °Soups and Combination Foods °· Allowed:  Clear bouillon, broth, or strained broth-based soups. °· Avoid: Any others. °Desserts and Sweets °· Allowed:  Sugar, honey. High protein gelatin. Flavored gelatin, ices, or frozen ice pops that do not contain milk. °· Avoid: Any others. °Fats and Oils °· Allowed:  None are allowed. °· Avoid: All are avoided. °Beverages °· Allowed: Cereal beverages, coffee (regular or decaffeinated), tea, or soda  at the discretion of your caregiver. °· Avoid: Any others. °Condiments °· Allowed:  Iodized salt. °· Avoid: Any others, including pepper. °Supplements °· Allowed:  Liquid nutrition beverages. °· Avoid: Any others that contain lactose or fiber. °SAMPLE MEAL PLAN °Breakfast °· 4 oz (120 mL) strained orange juice. °· ½ to 1 cup (125 to 250 mL) gelatin (plain or fortified). °· 1 cup (250 mL) beverage (coffee or tea). °· Sugar, if desired. °Midmorning Snack °· ½ cup (125 mL) gelatin (plain or fortified). °Lunch °· 1 cup (250 mL) broth or consommé. °· 4 oz (120 mL) strained grapefruit juice. °· ½ cup (125 mL) gelatin (plain or fortified). °· 1 cup (250 mL) beverage (coffee or tea). °· Sugar, if desired. °Midafternoon Snack °· ½ cup (125 mL) fruit ice. °· ½ cup (125 mL) strained fruit juice. °Dinner °· 1 cup (250 mL) broth or consommé. °· ½ cup (125 mL) cranberry juice. °· ½ cup (125 mL) flavored gelatin (plain or fortified). °· 1 cup (250 mL) beverage (coffee or tea). °· Sugar, if desired. °Evening Snack °· 4 oz (120 mL) strained apple juice (vitamin C-fortified). °· ½ cup (125 mL) flavored gelatin (plain or fortified). °Document Released: 07/31/2005 Document Revised: 10/23/2011 Document Reviewed: 10/28/2010 °ExitCare® Patient Information ©2013 [employer]. ° °

## 2012-08-30 NOTE — Progress Notes (Signed)
Subjective:    Patient ID: Jennifer [name], female    DOB: 1966-01-15, 47 y.o.   MRN: [id number]  HPI Has been having a lot of swelling in her throat. Says having a lot of pain with even moving her tongue. Says will get pricking sensation.  Went to ENT about 2 weeks ago and had scope showing signs of reflux.  Taking pepcid daily bc doesn't tolerate prilosec.  Had normal CXR about 1 week ago as well.  Nasal rinse is not helping.  Her throat has been really dry  As well.  Using her nasonex twice a day again.  Did vomit once with coughing and had pain on the right side.  Pain is worse on the right side.  Says her right nostril felt blocked when she did her nasal rinse.  Having pain in her temples as well today. She's concerned about this. She also still has a swollen and fullness sensation at the base of the right posterior tongue. She says it feels very sore if she pushes on it. She still feels like there's something stuck in her throat. She says it almost feels like when she had tonsillar stone. Once it was removed she got such great relief. In fact she's been eating so poorly that she's lost about 3 pounds. She did help her son do a lot of moving and lifting over the weekend but says was so exhausted afterwards that she barely got out of bed per day.  She is still very concerned about her blood pressure. She is no longer on the spironolactone area and evidently a dried her out too much so her endocrinologist stopped it. Review of Systems     Objective:   Physical Exam  Constitutional: She is oriented to person, place, and time. She appears well-developed and well-nourished.  HENT:  Head: Normocephalic and atraumatic.  Right Ear: External ear normal.  Left Ear: External ear normal.  Nose: Nose normal.  Mouth/Throat: Oropharynx is clear and moist.       TMs and canals are clear.   Eyes: Conjunctivae normal and EOM are normal. Pupils are equal, round, and reactive to light.  Neck: Neck supple. No  thyromegaly present.  Cardiovascular: Normal rate, regular rhythm and normal heart sounds.   Pulmonary/Chest: Effort normal and breath sounds normal. She has no wheezes.  Lymphadenopathy:    She has no cervical adenopathy.  Neurological: She is alert and oriented to person, place, and time.  Skin: Skin is warm and dry.  Psychiatric: She has a normal mood and affect.          Assessment & Plan:  Pharyngitis- She can't take steroids.  Recommend trial of Ibuprofen over the weekend and rest the throat.  Recommend she get children's ibuprofen which can be easily swallowed and avoid large gel capsules et Karie Soda. Drink only liquids and pureed foods. Avoid any breads and meats and thinks that she actually has to chew. Also recommend voice rest. Anything that we can do to reduce swelling and inflammation in the pharynx. I did reassure her about tonsillar stones. I think the ENT would've seen knees during his scope 2 weeks ago she had them and they were causing an issue. He would also notice a mass or polyp if there was something especially on the right side which is her most bothersome side.  Dypshagia. Has endoscopy schduled in 7 days.  Change to liquid diet. Avoid meats and breads since these are getting stuck.  She also  has some dysmotility of the esophagus.  She also had to delay her Botox injections in her throat because she was ill recently. She says she'll plan to reschedule that when she gets through the endoscopy next week. I strongly encouraged her to keep her appointment for endoscopy so they can better evaluate if she may have a stricture, mass, Zenkers diverticulum which may be contributing to her regurgitation episodes.  HTN- BP looks great today.  Gave her reassurance. We will continue to monitor it. Time spent 30 min, > 50% spent in counseling and reassurance about her throat adn dysphagia.

## 2012-08-31 DIAGNOSIS — Z87442 Personal history of urinary calculi: Secondary | ICD-10-CM | POA: Diagnosis not present

## 2012-08-31 DIAGNOSIS — G894 Chronic pain syndrome: Secondary | ICD-10-CM | POA: Diagnosis not present

## 2012-08-31 DIAGNOSIS — Z888 Allergy status to other drugs, medicaments and biological substances status: Secondary | ICD-10-CM | POA: Diagnosis not present

## 2012-08-31 DIAGNOSIS — E269 Hyperaldosteronism, unspecified: Secondary | ICD-10-CM | POA: Diagnosis not present

## 2012-08-31 DIAGNOSIS — Z79899 Other long term (current) drug therapy: Secondary | ICD-10-CM | POA: Diagnosis not present

## 2012-08-31 DIAGNOSIS — I10 Essential (primary) hypertension: Secondary | ICD-10-CM | POA: Diagnosis not present

## 2012-08-31 DIAGNOSIS — G4730 Sleep apnea, unspecified: Secondary | ICD-10-CM | POA: Diagnosis not present

## 2012-08-31 DIAGNOSIS — Z881 Allergy status to other antibiotic agents status: Secondary | ICD-10-CM | POA: Diagnosis not present

## 2012-08-31 DIAGNOSIS — R1013 Epigastric pain: Secondary | ICD-10-CM | POA: Diagnosis not present

## 2012-08-31 DIAGNOSIS — Z8673 Personal history of transient ischemic attack (TIA), and cerebral infarction without residual deficits: Secondary | ICD-10-CM | POA: Diagnosis not present

## 2012-09-03 ENCOUNTER — Ambulatory Visit: Payer: Medicare Other | Admitting: Family Medicine

## 2012-09-03 DIAGNOSIS — D432 Neoplasm of uncertain behavior of brain, unspecified: Secondary | ICD-10-CM | POA: Diagnosis not present

## 2012-09-03 DIAGNOSIS — J328 Other chronic sinusitis: Secondary | ICD-10-CM | POA: Diagnosis not present

## 2012-09-03 DIAGNOSIS — R69 Illness, unspecified: Secondary | ICD-10-CM | POA: Diagnosis not present

## 2012-09-03 DIAGNOSIS — R51 Headache: Secondary | ICD-10-CM | POA: Diagnosis not present

## 2012-09-03 IMAGING — CR DG ABDOMEN 1V
2 series · 2 of 2 positions shown · non-contrast
Comparison: 03/27/2011

CLINICAL DATA: Abdominal pain

ABDOMEN - 1 VIEW

[t abdomen supine (1 of 2)]
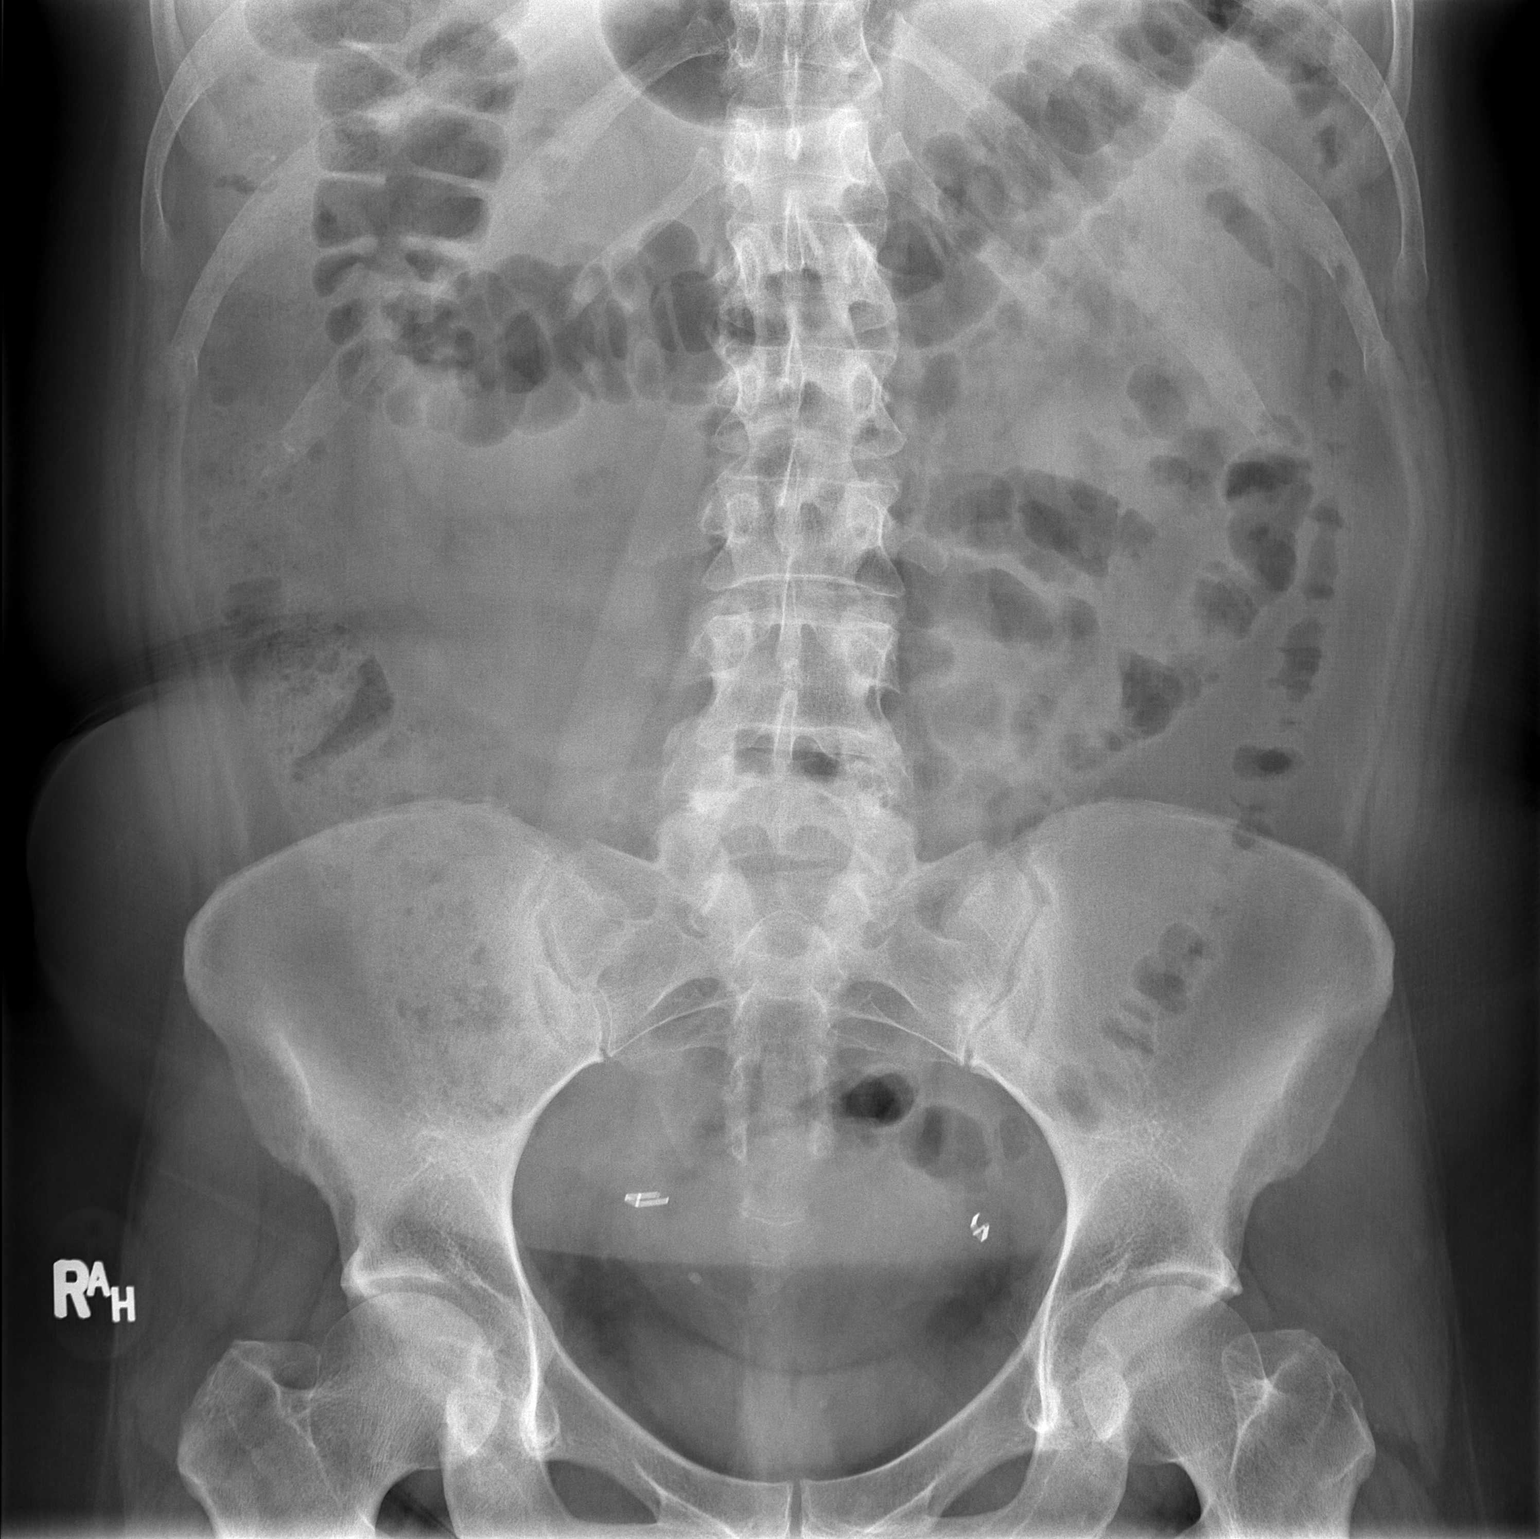

[t abdomen supine (2 of 2)]
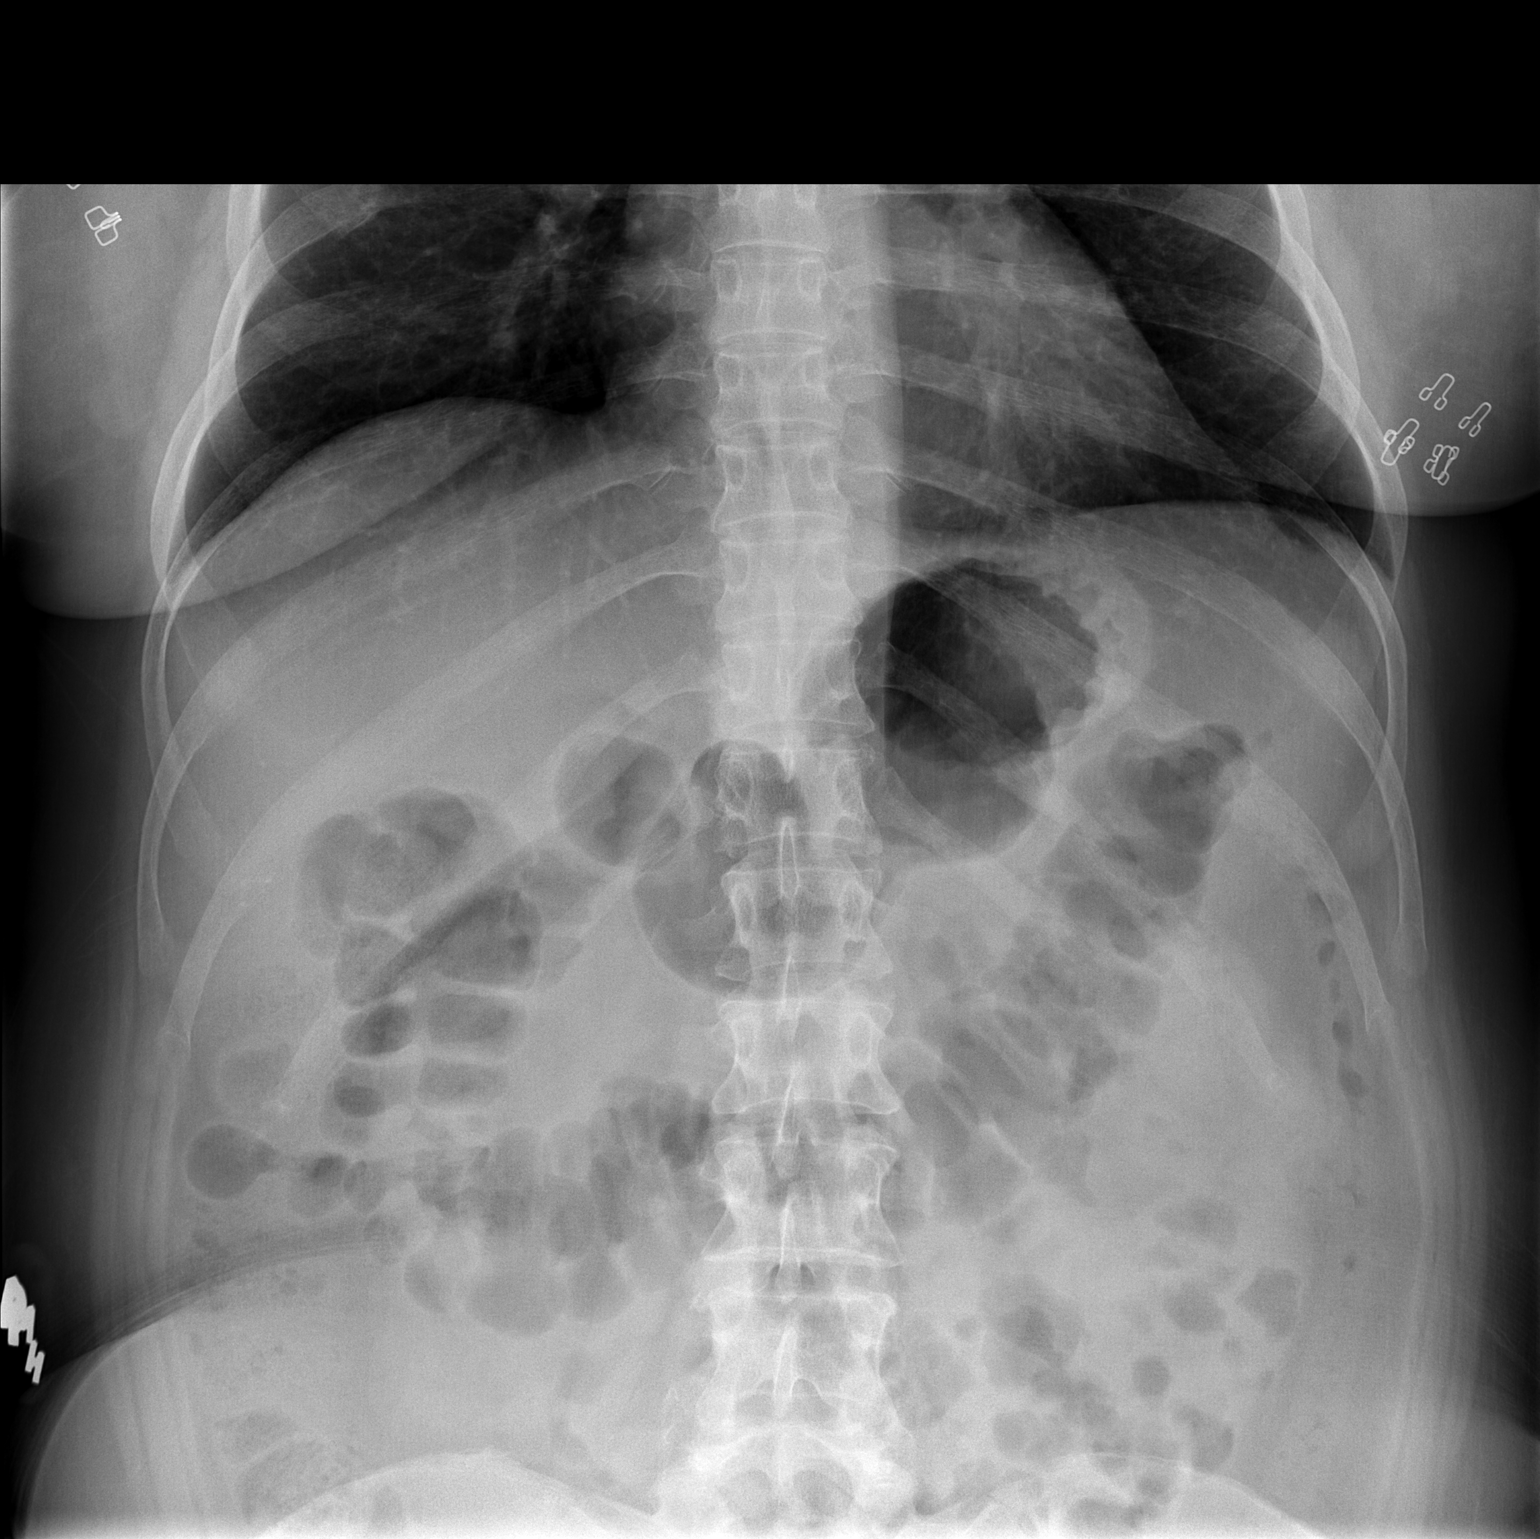

[2 of 2 positions shown; findings below may reference images not displayed]

FINDINGS: Normal bowel gas pattern.  Negative for bowel
obstruction.

Fallopian tube clips in the pelvis bilaterally.  No significant
bony abnormality.  No renal calculi.
IMPRESSION: Negative

## 2012-09-04 DIAGNOSIS — R51 Headache: Secondary | ICD-10-CM | POA: Diagnosis not present

## 2012-09-05 DIAGNOSIS — H53039 Strabismic amblyopia, unspecified eye: Secondary | ICD-10-CM | POA: Diagnosis not present

## 2012-09-05 DIAGNOSIS — H16219 Exposure keratoconjunctivitis, unspecified eye: Secondary | ICD-10-CM | POA: Diagnosis not present

## 2012-09-05 DIAGNOSIS — R51 Headache: Secondary | ICD-10-CM | POA: Diagnosis not present

## 2012-09-05 DIAGNOSIS — R040 Epistaxis: Secondary | ICD-10-CM | POA: Diagnosis not present

## 2012-09-05 DIAGNOSIS — J310 Chronic rhinitis: Secondary | ICD-10-CM | POA: Diagnosis not present

## 2012-09-05 DIAGNOSIS — H16229 Keratoconjunctivitis sicca, not specified as Sjogren's, unspecified eye: Secondary | ICD-10-CM | POA: Diagnosis not present

## 2012-09-05 DIAGNOSIS — H4910 Fourth [trochlear] nerve palsy, unspecified eye: Secondary | ICD-10-CM | POA: Diagnosis not present

## 2012-09-06 DIAGNOSIS — K219 Gastro-esophageal reflux disease without esophagitis: Secondary | ICD-10-CM | POA: Diagnosis not present

## 2012-09-06 DIAGNOSIS — Z79899 Other long term (current) drug therapy: Secondary | ICD-10-CM | POA: Diagnosis not present

## 2012-09-06 DIAGNOSIS — R109 Unspecified abdominal pain: Secondary | ICD-10-CM | POA: Diagnosis not present

## 2012-09-06 DIAGNOSIS — R1011 Right upper quadrant pain: Secondary | ICD-10-CM | POA: Diagnosis not present

## 2012-09-06 DIAGNOSIS — K208 Other esophagitis without bleeding: Secondary | ICD-10-CM | POA: Diagnosis not present

## 2012-09-06 DIAGNOSIS — I10 Essential (primary) hypertension: Secondary | ICD-10-CM | POA: Diagnosis not present

## 2012-09-06 DIAGNOSIS — D649 Anemia, unspecified: Secondary | ICD-10-CM | POA: Diagnosis not present

## 2012-09-06 DIAGNOSIS — K5730 Diverticulosis of large intestine without perforation or abscess without bleeding: Secondary | ICD-10-CM | POA: Diagnosis not present

## 2012-09-06 DIAGNOSIS — K5900 Constipation, unspecified: Secondary | ICD-10-CM | POA: Diagnosis not present

## 2012-09-06 DIAGNOSIS — F411 Generalized anxiety disorder: Secondary | ICD-10-CM | POA: Diagnosis not present

## 2012-09-06 DIAGNOSIS — R634 Abnormal weight loss: Secondary | ICD-10-CM | POA: Diagnosis not present

## 2012-09-06 DIAGNOSIS — K319 Disease of stomach and duodenum, unspecified: Secondary | ICD-10-CM | POA: Diagnosis not present

## 2012-09-06 DIAGNOSIS — K2100 Gastro-esophageal reflux disease with esophagitis, without bleeding: Secondary | ICD-10-CM | POA: Diagnosis not present

## 2012-09-06 DIAGNOSIS — K209 Esophagitis, unspecified without bleeding: Secondary | ICD-10-CM | POA: Diagnosis not present

## 2012-09-06 DIAGNOSIS — J449 Chronic obstructive pulmonary disease, unspecified: Secondary | ICD-10-CM | POA: Diagnosis not present

## 2012-09-06 DIAGNOSIS — J45909 Unspecified asthma, uncomplicated: Secondary | ICD-10-CM | POA: Diagnosis not present

## 2012-09-07 IMAGING — CR DG ABDOMEN ACUTE W/ 1V CHEST
3 series · 3 of 3 positions shown · non-contrast
Comparison: Chest and acute abdomen of 03/27/2011

CLINICAL DATA: Right upper quadrant abdominal pain

ACUTE ABDOMEN SERIES (ABDOMEN 2 VIEW & CHEST 1 VIEW)

[w chest pa]
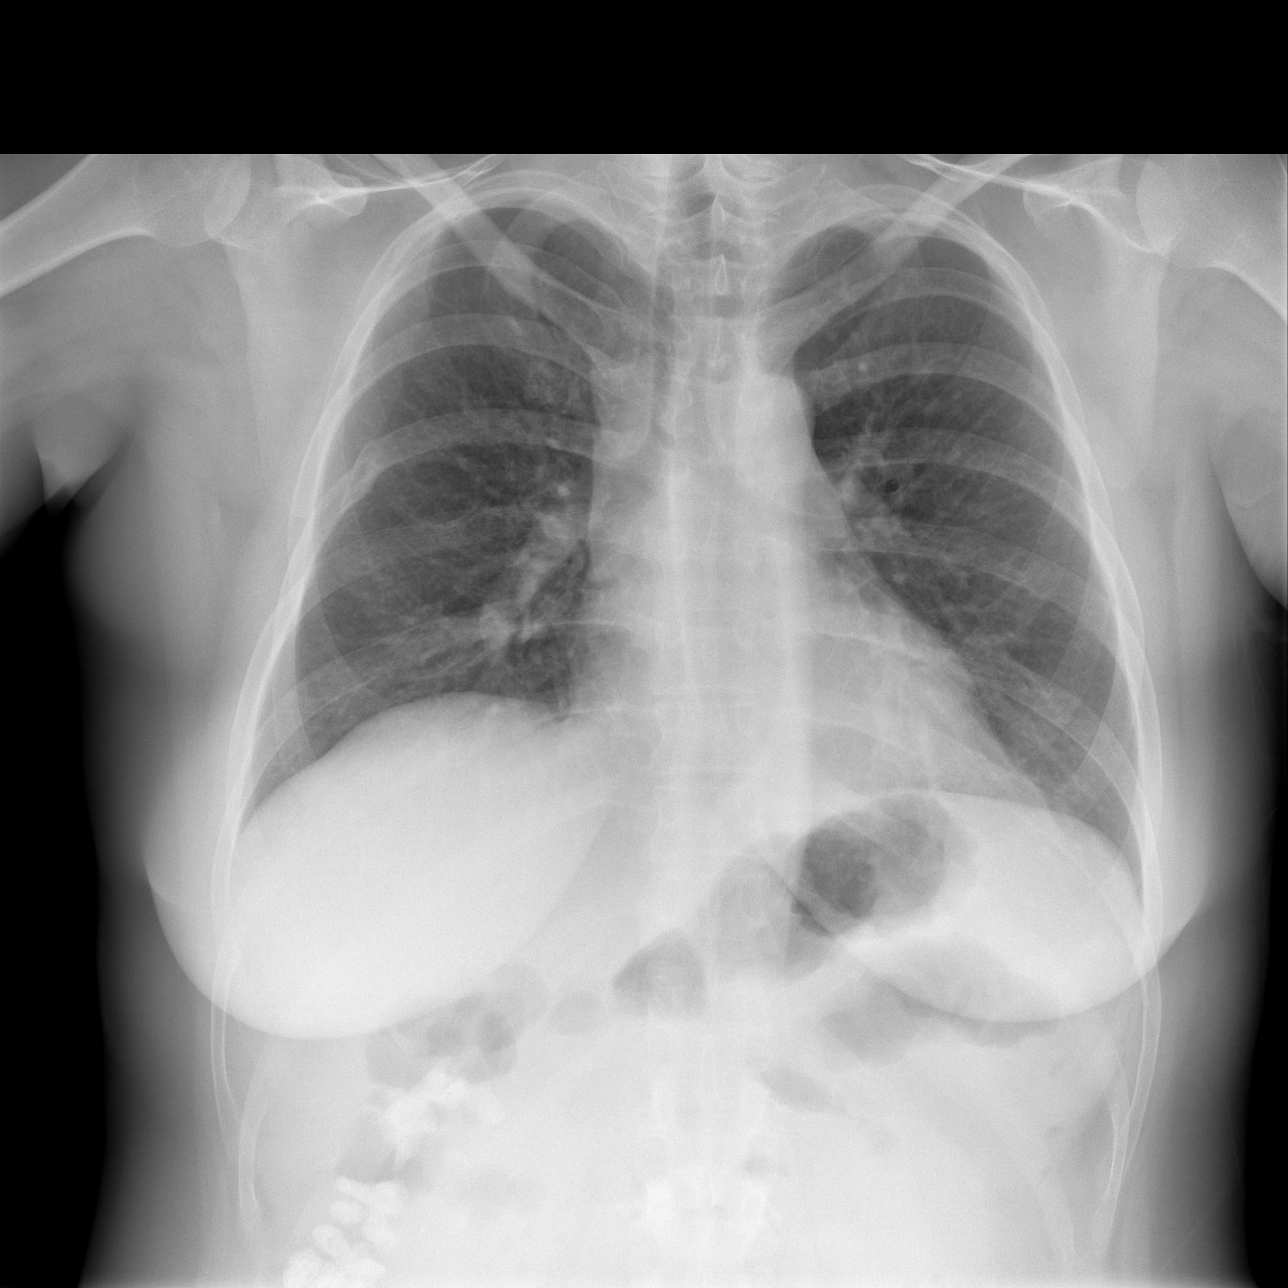

[w abdomen upright]
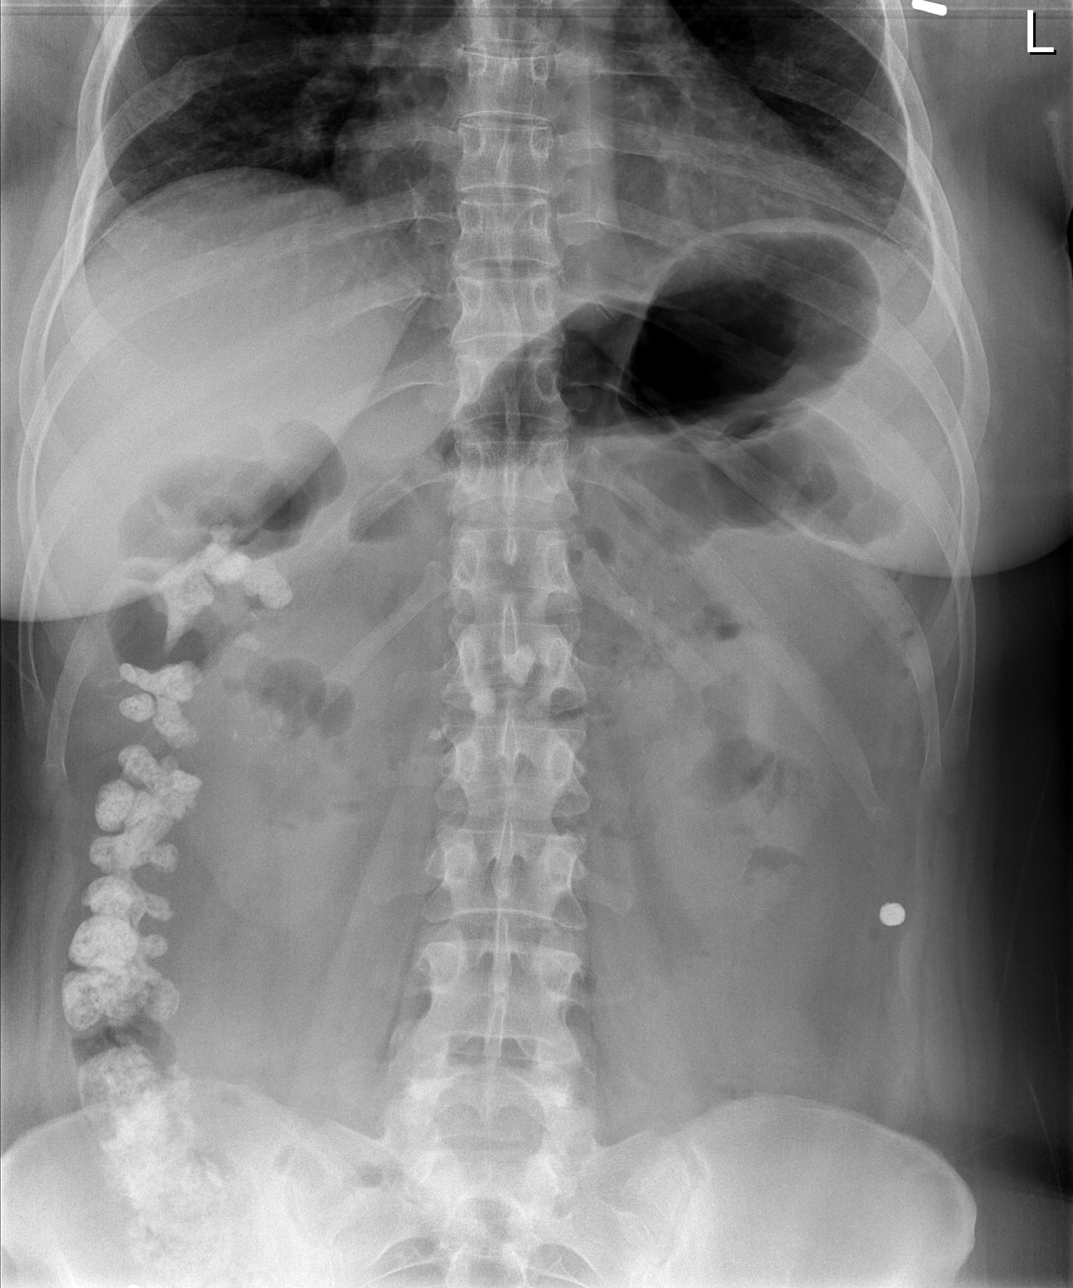

[t abdomen supine]
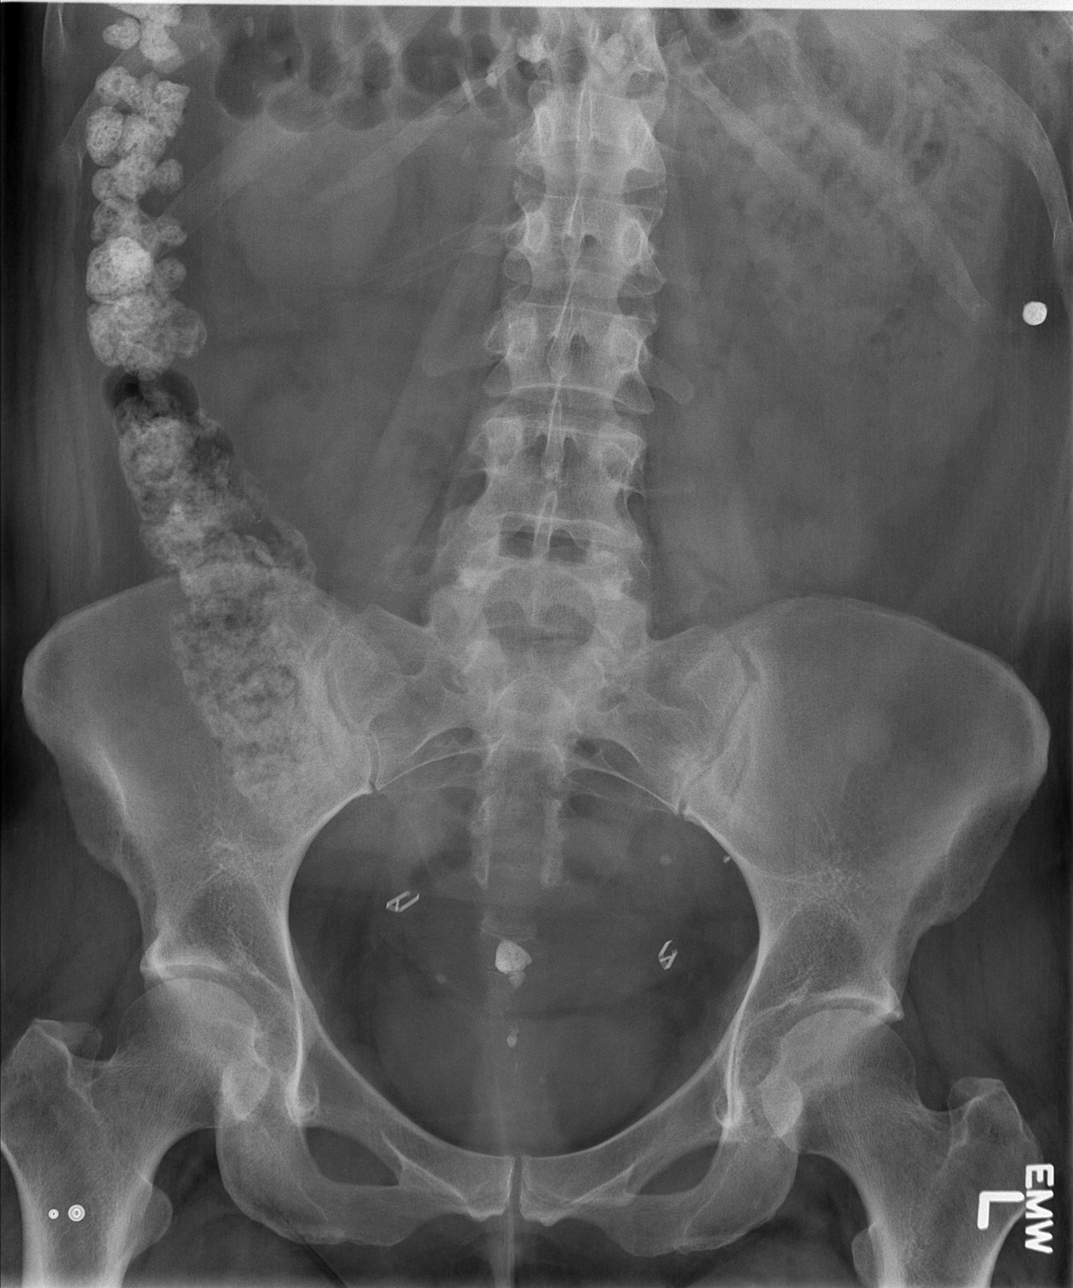

[3 of 3 positions shown; findings below may reference images not displayed]

FINDINGS: The lungs are clear.  Mediastinal contours appear normal.
The heart is within normal limits in size.  An old healed right
lateral sixth rib fracture is noted.

Supine and erect views of the abdomen show no bowel obstruction.
Some contrast is noted within the right colon.  No free air is
seen.  Contrast is noted within a few scattered colonic
diverticula.  Surgical clips are present presumably due to
bilateral tubal ligation.  Small right renal calculi cannot be
excluded.
IMPRESSION: 1.  No active lung disease.
2.  No bowel obstruction.  No free air.
3.  Cannot exclude right renal calculi.

## 2012-09-07 IMAGING — US US ABDOMEN COMPLETE
1 series · 13 of 25 positions shown · non-contrast
Comparison: CT abdomen and pelvis 01/25/2010.

CLINICAL DATA: Right upper quadrant abdominal pain radiating to
the back.  Nausea.  Surgical history includes appendectomy.

COMPLETE ABDOMINAL ULTRASOUND 06/15/2011:

[Series 1: us abdomen complete · 0.35mm/px · 13 of 65 slices shown]
[im 1/65]
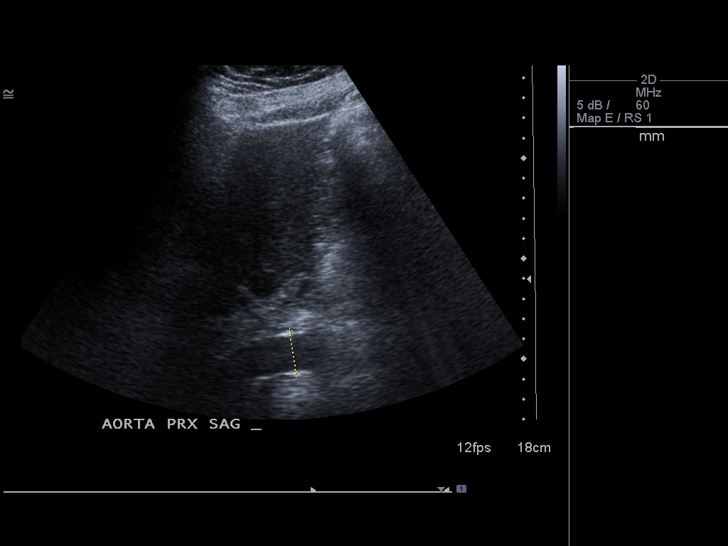
[im 6/65]
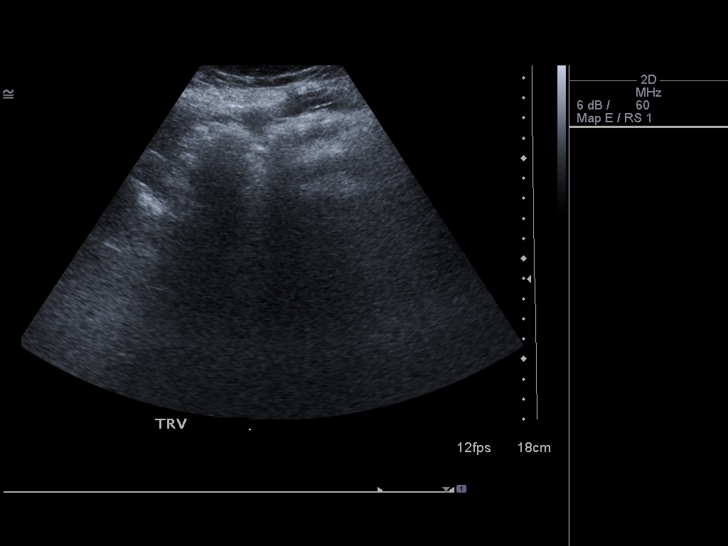
[im 11/65]
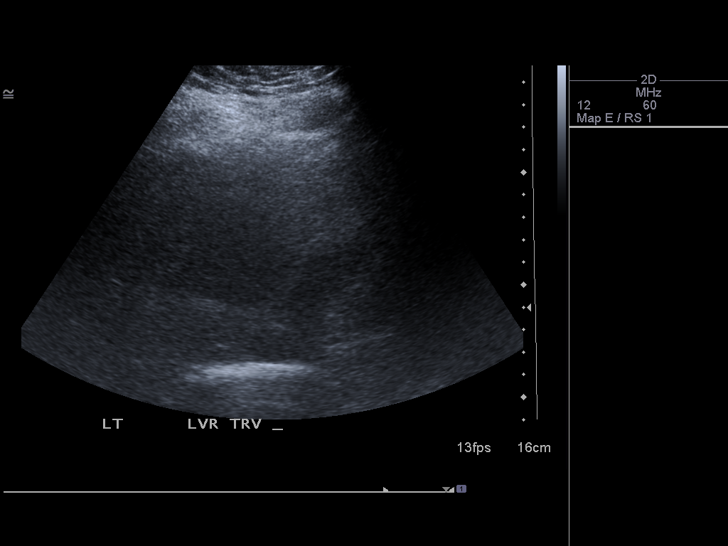
[im 17/65]
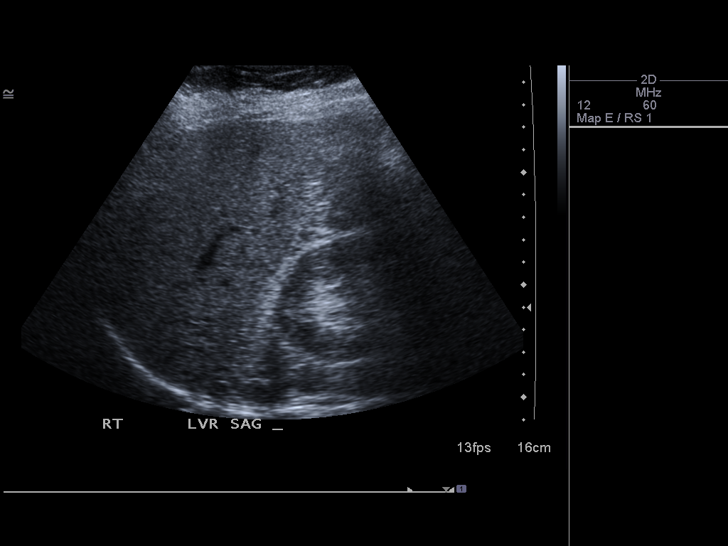
[im 22/65]
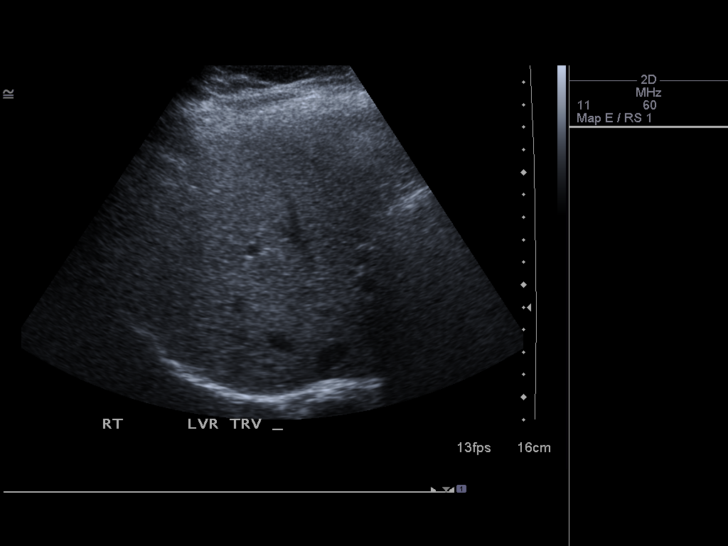
[im 27/65]
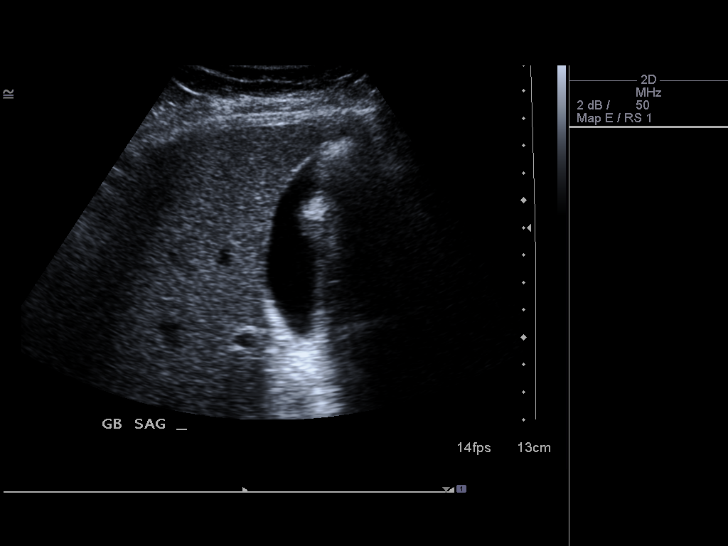
[im 33/65]
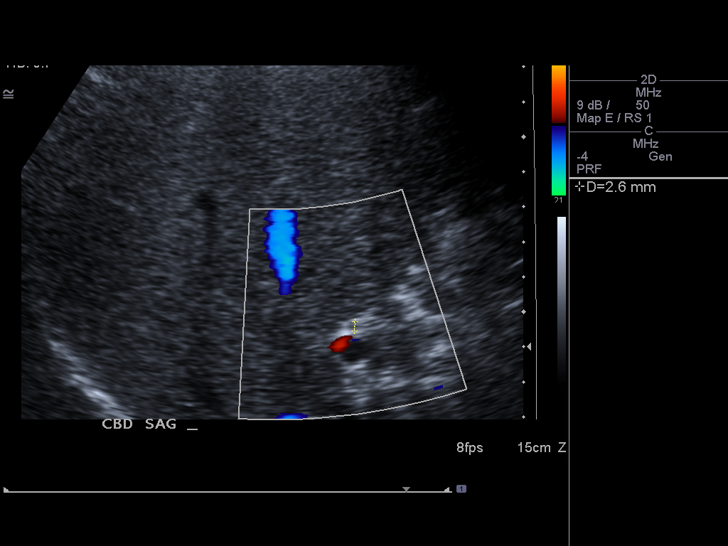
[im 38/65]
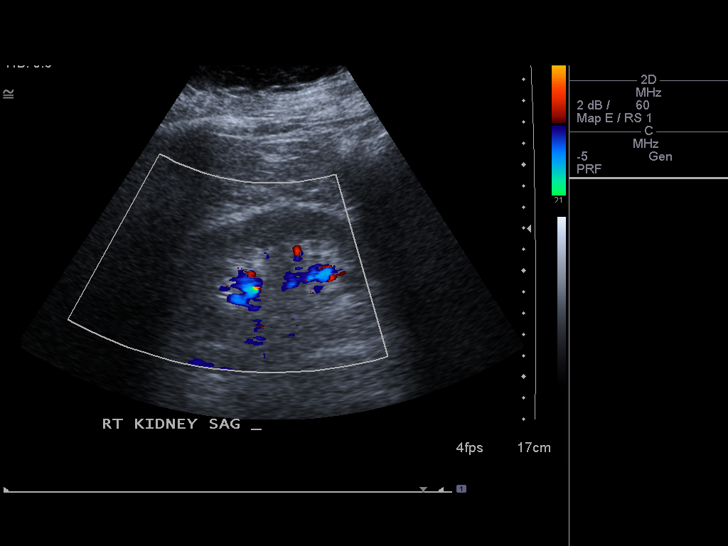
[im 43/65]
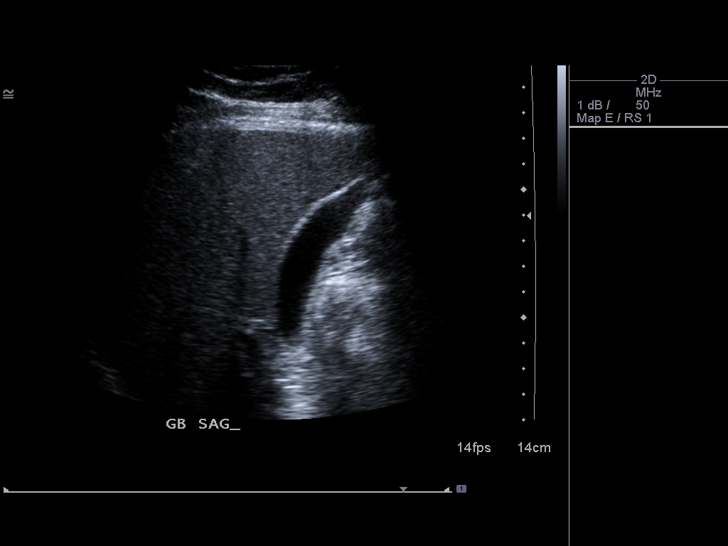
[im 49/65]
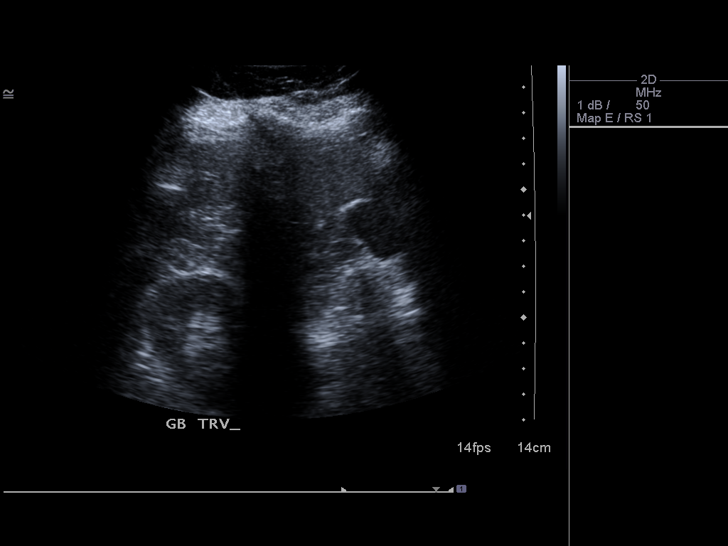
[im 54/65]
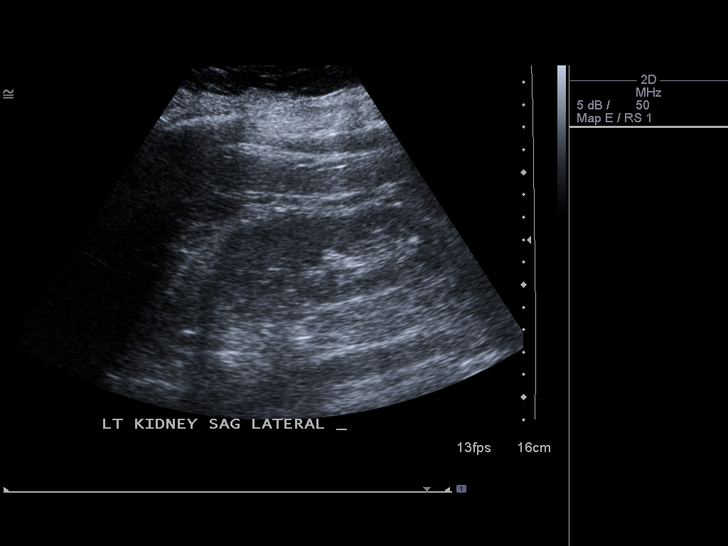
[im 59/65]
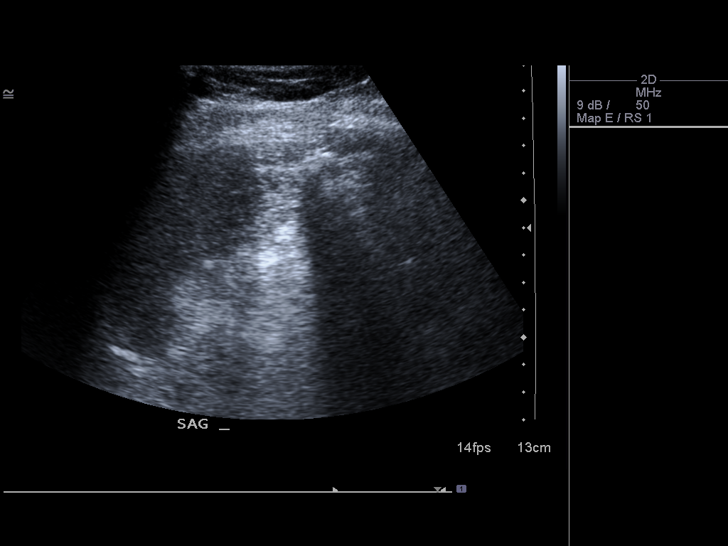
[im 65/65]
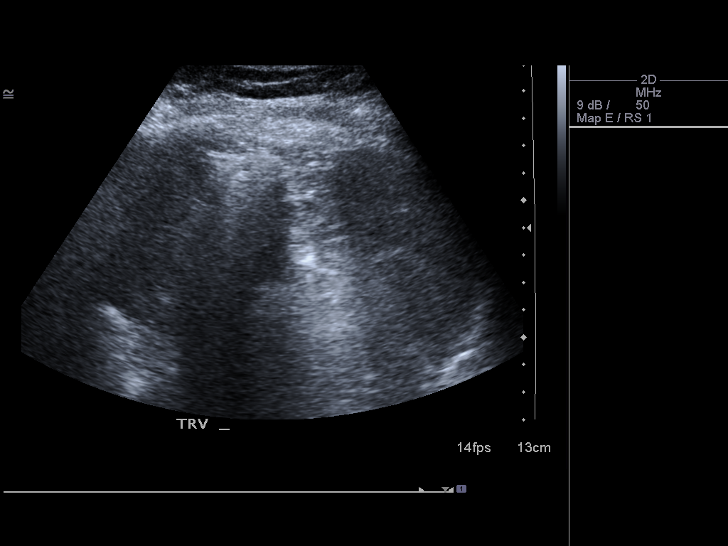

[13 of 25 positions shown; findings below may reference images not displayed]

FINDINGS: Gallbladder:  Mildly contracted. No shadowing gallstones or
echogenic sludge.  No gallbladder wall thickening or
pericholecystic fluid.  Negative sonographic Murphy's sign
according to the ultrasound technologist.  The tiny gallstone noted
on the prior CT was not visualized.

Common bile duct:  Normal in caliber with maximum diameter
approximating 3 mm.

Liver:  Scattered areas of mildly increased and coarsened
echotexture without focal hepatic parenchymal masses.  The
hemangioma peripherally in the anterior segment right lobe
identified on the CT was not visualized.

IVC:  Patent in its intrahepatic portion.  Obscured outside the
liver by bowel gas.

Pancreas:  Obscured by midline bowel gas.

Spleen:  Normal size and echotexture without focal parenchymal
abnormality.

Right Kidney:  No hydronephrosis.  Well-preserved cortex.  No
shadowing calculi.  Normal size and parenchymal echotexture without
focal abnormalities.  Approximately 11.8 cm length.

Left Kidney:  No hydronephrosis.  Well-preserved cortex.  No
shadowing calculi.  Normal size and parenchymal echotexture without
focal abnormalities.  Dromedary hump.  Approximately 10.6 cm
length.

Abdominal aorta:  Normal in caliber throughout its visualized
course in the abdomen without significant atherosclerosis.
IMPRESSION: 1.  Focal areas of hepatic steatosis without hepatic parenchymal
masses.  The hemangioma in the right lobe of the liver identified
on the prior CT was not visualized.
2.  No evidence of cholelithiasis, cholecystitis, or biliary ductal
dilation.  The tiny gallstone in the gallbladder identified on the
prior CT was not visualized.
3.  Otherwise normal examination with a caveat that the
extrahepatic IVC and the pancreas were obscured by overlying bowel
gas and were therefore not evaluated.

## 2012-09-09 DIAGNOSIS — F450 Somatization disorder: Secondary | ICD-10-CM | POA: Diagnosis not present

## 2012-09-09 DIAGNOSIS — F319 Bipolar disorder, unspecified: Secondary | ICD-10-CM | POA: Diagnosis not present

## 2012-09-09 DIAGNOSIS — F459 Somatoform disorder, unspecified: Secondary | ICD-10-CM | POA: Diagnosis not present

## 2012-09-09 DIAGNOSIS — R51 Headache: Secondary | ICD-10-CM | POA: Diagnosis not present

## 2012-09-09 DIAGNOSIS — D649 Anemia, unspecified: Secondary | ICD-10-CM | POA: Diagnosis not present

## 2012-09-09 DIAGNOSIS — H539 Unspecified visual disturbance: Secondary | ICD-10-CM | POA: Diagnosis not present

## 2012-09-09 DIAGNOSIS — Z79899 Other long term (current) drug therapy: Secondary | ICD-10-CM | POA: Diagnosis not present

## 2012-09-10 ENCOUNTER — Encounter: Payer: Self-pay | Admitting: Family Medicine

## 2012-09-10 ENCOUNTER — Ambulatory Visit (INDEPENDENT_AMBULATORY_CARE_PROVIDER_SITE_OTHER): Payer: Medicare Other | Admitting: Family Medicine

## 2012-09-10 VITALS — BP 144/82 | HR 68 | Wt 188.0 lb

## 2012-09-10 DIAGNOSIS — R002 Palpitations: Secondary | ICD-10-CM

## 2012-09-10 DIAGNOSIS — R51 Headache: Secondary | ICD-10-CM

## 2012-09-10 DIAGNOSIS — H539 Unspecified visual disturbance: Secondary | ICD-10-CM

## 2012-09-10 DIAGNOSIS — M79609 Pain in unspecified limb: Secondary | ICD-10-CM | POA: Diagnosis not present

## 2012-09-10 DIAGNOSIS — I10 Essential (primary) hypertension: Secondary | ICD-10-CM | POA: Diagnosis not present

## 2012-09-10 DIAGNOSIS — R519 Headache, unspecified: Secondary | ICD-10-CM

## 2012-09-10 DIAGNOSIS — M79659 Pain in unspecified thigh: Secondary | ICD-10-CM
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
## 2012-09-12 ENCOUNTER — Encounter (HOSPITAL_BASED_OUTPATIENT_CLINIC_OR_DEPARTMENT_OTHER): Payer: Self-pay | Admitting: Family Medicine

## 2012-09-12 ENCOUNTER — Emergency Department (HOSPITAL_BASED_OUTPATIENT_CLINIC_OR_DEPARTMENT_OTHER)
Admission: EM | Admit: 2012-09-12 | Discharge: 2012-09-12 | Disposition: A | Discharge status: Home / Self Care | Payer: Medicare Other | Attending: Emergency Medicine | Admitting: Emergency Medicine

## 2012-09-12 ENCOUNTER — Emergency Department (HOSPITAL_BASED_OUTPATIENT_CLINIC_OR_DEPARTMENT_OTHER): Payer: Medicare Other

## 2012-09-12 DIAGNOSIS — R609 Edema, unspecified: Secondary | ICD-10-CM | POA: Diagnosis not present

## 2012-09-12 DIAGNOSIS — Z9989 Dependence on other enabling machines and devices: Secondary | ICD-10-CM | POA: Insufficient documentation

## 2012-09-12 DIAGNOSIS — R109 Unspecified abdominal pain: Secondary | ICD-10-CM | POA: Diagnosis not present

## 2012-09-12 DIAGNOSIS — E785 Hyperlipidemia, unspecified: Secondary | ICD-10-CM | POA: Diagnosis not present

## 2012-09-12 DIAGNOSIS — Z8669 Personal history of other diseases of the nervous system and sense organs: Secondary | ICD-10-CM | POA: Diagnosis not present

## 2012-09-12 DIAGNOSIS — Z8639 Personal history of other endocrine, nutritional and metabolic disease: Secondary | ICD-10-CM | POA: Insufficient documentation

## 2012-09-12 DIAGNOSIS — Z8719 Personal history of other diseases of the digestive system: Secondary | ICD-10-CM | POA: Diagnosis not present

## 2012-09-12 DIAGNOSIS — Z862 Personal history of diseases of the blood and blood-forming organs and certain disorders involving the immune mechanism: Secondary | ICD-10-CM | POA: Diagnosis not present

## 2012-09-12 DIAGNOSIS — M889 Osteitis deformans of unspecified bone: Secondary | ICD-10-CM | POA: Insufficient documentation

## 2012-09-12 DIAGNOSIS — I10 Essential (primary) hypertension: Secondary | ICD-10-CM | POA: Insufficient documentation

## 2012-09-12 DIAGNOSIS — F4310 Post-traumatic stress disorder, unspecified: Secondary | ICD-10-CM | POA: Diagnosis not present

## 2012-09-12 DIAGNOSIS — Z87891 Personal history of nicotine dependence: Secondary | ICD-10-CM | POA: Diagnosis not present

## 2012-09-12 DIAGNOSIS — K5900 Constipation, unspecified: Secondary | ICD-10-CM | POA: Diagnosis not present

## 2012-09-12 DIAGNOSIS — R079 Chest pain, unspecified: Secondary | ICD-10-CM | POA: Diagnosis not present

## 2012-09-12 DIAGNOSIS — Z8679 Personal history of other diseases of the circulatory system: Secondary | ICD-10-CM | POA: Diagnosis not present

## 2012-09-12 DIAGNOSIS — J45909 Unspecified asthma, uncomplicated: Secondary | ICD-10-CM | POA: Insufficient documentation

## 2012-09-12 DIAGNOSIS — K3189 Other diseases of stomach and duodenum: Secondary | ICD-10-CM | POA: Diagnosis not present

## 2012-09-12 DIAGNOSIS — Z79899 Other long term (current) drug therapy: Secondary | ICD-10-CM | POA: Insufficient documentation

## 2012-09-12 DIAGNOSIS — G4730 Sleep apnea, unspecified: Secondary | ICD-10-CM | POA: Diagnosis not present

## 2012-09-12 DIAGNOSIS — M79609 Pain in unspecified limb: Secondary | ICD-10-CM | POA: Insufficient documentation

## 2012-09-12 DIAGNOSIS — Z8709 Personal history of other diseases of the respiratory system: Secondary | ICD-10-CM | POA: Diagnosis not present

## 2012-09-12 DIAGNOSIS — Z8739 Personal history of other diseases of the musculoskeletal system and connective tissue: Secondary | ICD-10-CM | POA: Insufficient documentation

## 2012-09-12 DIAGNOSIS — M79606 Pain in leg, unspecified: Secondary | ICD-10-CM

## 2012-09-12 DIAGNOSIS — R110 Nausea: Secondary | ICD-10-CM | POA: Diagnosis not present

## 2012-09-12 DIAGNOSIS — F450 Somatization disorder: Secondary | ICD-10-CM | POA: Diagnosis not present

## 2012-09-12 NOTE — ED Notes (Signed)
Pt c/o right leg and foot pain and swelling since last Saturday.
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
## 2012-09-13 ENCOUNTER — Telehealth: Payer: Self-pay | Admitting: *Deleted

## 2012-09-13 ENCOUNTER — Ambulatory Visit: Payer: Medicare Other | Admitting: Family Medicine

## 2012-09-13 DIAGNOSIS — R110 Nausea: Secondary | ICD-10-CM | POA: Diagnosis not present

## 2012-09-13 DIAGNOSIS — K3189 Other diseases of stomach and duodenum: Secondary | ICD-10-CM | POA: Diagnosis not present

## 2012-09-13 DIAGNOSIS — F450 Somatization disorder: Secondary | ICD-10-CM | POA: Diagnosis not present

## 2012-09-13 DIAGNOSIS — D320 Benign neoplasm of cerebral meninges: Secondary | ICD-10-CM | POA: Diagnosis not present

## 2012-09-13 DIAGNOSIS — I10 Essential (primary) hypertension: Secondary | ICD-10-CM | POA: Diagnosis not present

## 2012-09-13 DIAGNOSIS — R5381 Other malaise: Secondary | ICD-10-CM | POA: Diagnosis not present

## 2012-09-13 DIAGNOSIS — R079 Chest pain, unspecified: Secondary | ICD-10-CM | POA: Diagnosis not present

## 2012-09-13 DIAGNOSIS — R51 Headache: Secondary | ICD-10-CM | POA: Diagnosis not present

## 2012-09-13 NOTE — Telephone Encounter (Signed)
Called pt and lvm informing her that Dr. Linford Arnold felt that this would be fine for her to take.Jennifer [name] [place]

## 2012-09-17 ENCOUNTER — Telehealth: Payer: Self-pay | Admitting: *Deleted

## 2012-09-17 DIAGNOSIS — R000 Tachycardia, unspecified: Secondary | ICD-10-CM | POA: Diagnosis not present

## 2012-09-17 DIAGNOSIS — E7800 Pure hypercholesterolemia, unspecified: Secondary | ICD-10-CM | POA: Diagnosis not present

## 2012-09-17 DIAGNOSIS — I10 Essential (primary) hypertension: Secondary | ICD-10-CM | POA: Diagnosis not present

## 2012-09-17 DIAGNOSIS — F411 Generalized anxiety disorder: Secondary | ICD-10-CM | POA: Diagnosis not present

## 2012-09-17 DIAGNOSIS — Z79899 Other long term (current) drug therapy: Secondary | ICD-10-CM | POA: Diagnosis not present

## 2012-09-17 DIAGNOSIS — Z882 Allergy status to sulfonamides status: Secondary | ICD-10-CM | POA: Diagnosis not present

## 2012-09-17 DIAGNOSIS — M889 Osteitis deformans of unspecified bone: Secondary | ICD-10-CM | POA: Diagnosis not present

## 2012-09-17 DIAGNOSIS — R059 Cough, unspecified: Secondary | ICD-10-CM | POA: Diagnosis not present

## 2012-09-17 DIAGNOSIS — Z888 Allergy status to other drugs, medicaments and biological substances status: Secondary | ICD-10-CM | POA: Diagnosis not present

## 2012-09-17 DIAGNOSIS — R05 Cough: Secondary | ICD-10-CM | POA: Diagnosis not present

## 2012-09-17 DIAGNOSIS — Z880 Allergy status to penicillin: Secondary | ICD-10-CM | POA: Diagnosis not present

## 2012-09-17 DIAGNOSIS — J45909 Unspecified asthma, uncomplicated: Secondary | ICD-10-CM | POA: Diagnosis not present

## 2012-09-17 DIAGNOSIS — J45901 Unspecified asthma with (acute) exacerbation: Secondary | ICD-10-CM | POA: Diagnosis not present

## 2012-09-17 DIAGNOSIS — Z881 Allergy status to other antibiotic agents status: Secondary | ICD-10-CM | POA: Diagnosis not present

## 2012-09-17 NOTE — Telephone Encounter (Signed)
Pt called in a frantic type state exclaiming that her BP is elevated (165/112), she is having palpitations, and HA. I informed pt that Dr. Linford Arnold was not here today and that she would not be able to see her until Friday. I offered pt to come in to be seen by another provider and she expressed that she did not wish to see anyone but Dr. Linford Arnold. I looked back at her previous OV and asked her about what she was taking. She informed me that she was taking coreg as agreed and directed to do. She told me that she takes this med BID and she hasn't taken her dose for the day. I told her not to take today.  She wanted to know what she should do I told her that she should first try to stay calm and try to relax she should go lie down and to take some tylenol for the HA, and/or in addition to that she can either use an Ice pack or moist heat to help to soothe the pain of the HA. Pt stated that she has been lying around all day. I told her that should she feel any worse that she should go either to UC or the ED. I asked her if she had a BP monitor that she could check her bp with at home and she does but she doesn't feel that it is accurate. I told her that I would inform Dr. Linford Arnold of this.Laureen Ochs, Viann [place]ove

## 2012-09-17 NOTE — Telephone Encounter (Signed)
What dose is she taking? WE will likely need to increase it ... Though I agree that staying calm is really important to controlling her BP.

## 2012-09-18 MED ORDER — CARVEDILOL 6.25 MG PO TABS: 6.2500 mg | ORAL_TABLET | Freq: Two times a day (BID) | ORAL | Status: DC

## 2012-09-18 NOTE — Telephone Encounter (Signed)
Called and informed pt. Told her that she can p/u rx at pharm.Loralee Pacas [place]

## 2012-09-18 NOTE — Telephone Encounter (Signed)
Spoke with pt she is taking the  Lowest dose of coreg 3.125. She told me that she had been resting and not doing much. I told her to restart her meds today and that we will see her on Friday.  Pt is still concerned about the way that she is still feeling. I told her to just to continue resting and remaining calm, drink plenty of fluids. Forwarded to Dr. Linford Arnold.Loralee Pacas [place]

## 2012-09-18 NOTE — Telephone Encounter (Signed)
I will increase her dose to 6.25 bid.  Tell her not to take her BP constantly.

## 2012-09-19 ENCOUNTER — Emergency Department (HOSPITAL_BASED_OUTPATIENT_CLINIC_OR_DEPARTMENT_OTHER)
Admission: EM | Admit: 2012-09-19 | Discharge: 2012-09-19 | Disposition: A | Discharge status: Home / Self Care | Payer: Medicare Other | Attending: Emergency Medicine | Admitting: Emergency Medicine

## 2012-09-19 ENCOUNTER — Encounter (HOSPITAL_BASED_OUTPATIENT_CLINIC_OR_DEPARTMENT_OTHER): Payer: Self-pay | Admitting: *Deleted

## 2012-09-19 ENCOUNTER — Emergency Department (HOSPITAL_BASED_OUTPATIENT_CLINIC_OR_DEPARTMENT_OTHER): Payer: Medicare Other

## 2012-09-19 DIAGNOSIS — G4730 Sleep apnea, unspecified: Secondary | ICD-10-CM | POA: Diagnosis not present

## 2012-09-19 DIAGNOSIS — Z9981 Dependence on supplemental oxygen: Secondary | ICD-10-CM | POA: Diagnosis not present

## 2012-09-19 DIAGNOSIS — Z8739 Personal history of other diseases of the musculoskeletal system and connective tissue: Secondary | ICD-10-CM | POA: Insufficient documentation

## 2012-09-19 DIAGNOSIS — R1011 Right upper quadrant pain: Secondary | ICD-10-CM | POA: Diagnosis not present

## 2012-09-19 DIAGNOSIS — K7689 Other specified diseases of liver: Secondary | ICD-10-CM | POA: Diagnosis not present

## 2012-09-19 DIAGNOSIS — G40909 Epilepsy, unspecified, not intractable, without status epilepticus: Secondary | ICD-10-CM | POA: Insufficient documentation

## 2012-09-19 DIAGNOSIS — R1084 Generalized abdominal pain: Secondary | ICD-10-CM | POA: Diagnosis not present

## 2012-09-19 DIAGNOSIS — I10 Essential (primary) hypertension: Secondary | ICD-10-CM | POA: Diagnosis not present

## 2012-09-19 DIAGNOSIS — K219 Gastro-esophageal reflux disease without esophagitis: Secondary | ICD-10-CM | POA: Diagnosis not present

## 2012-09-19 DIAGNOSIS — Z8679 Personal history of other diseases of the circulatory system: Secondary | ICD-10-CM | POA: Diagnosis not present

## 2012-09-19 DIAGNOSIS — Z862 Personal history of diseases of the blood and blood-forming organs and certain disorders involving the immune mechanism: Secondary | ICD-10-CM | POA: Insufficient documentation

## 2012-09-19 DIAGNOSIS — J45901 Unspecified asthma with (acute) exacerbation: Secondary | ICD-10-CM | POA: Diagnosis not present

## 2012-09-19 DIAGNOSIS — R0602 Shortness of breath: Secondary | ICD-10-CM

## 2012-09-19 DIAGNOSIS — Z8719 Personal history of other diseases of the digestive system: Secondary | ICD-10-CM | POA: Diagnosis not present

## 2012-09-19 DIAGNOSIS — Z79899 Other long term (current) drug therapy: Secondary | ICD-10-CM | POA: Diagnosis not present

## 2012-09-19 DIAGNOSIS — Z87891 Personal history of nicotine dependence: Secondary | ICD-10-CM | POA: Insufficient documentation

## 2012-09-19 DIAGNOSIS — E119 Type 2 diabetes mellitus without complications: Secondary | ICD-10-CM | POA: Diagnosis not present

## 2012-09-19 DIAGNOSIS — Z8639 Personal history of other endocrine, nutritional and metabolic disease: Secondary | ICD-10-CM | POA: Insufficient documentation

## 2012-09-19 DIAGNOSIS — Z8659 Personal history of other mental and behavioral disorders: Secondary | ICD-10-CM | POA: Insufficient documentation
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
## 2012-09-19 NOTE — ED Provider Notes (Signed)
Medical screening examination/treatment/procedure(s) were performed by non-physician practitioner and as supervising physician I was immediately available for consultation/collaboration.   Glynn Octave, MD 09/19/12 [phone]

## 2012-09-19 NOTE — ED Notes (Signed)
Pt. Very upset on the phone prior to having BP checked.

## 2012-09-19 NOTE — ED Notes (Signed)
Pt amb to triage with quick steady gait in nad. Pt reports that she was eating raisin toast earlier and a raisin "got hung" for a minute and she has had a cough since then.

## 2012-09-20 ENCOUNTER — Encounter: Payer: Self-pay | Admitting: Family Medicine

## 2012-09-20 ENCOUNTER — Ambulatory Visit (INDEPENDENT_AMBULATORY_CARE_PROVIDER_SITE_OTHER): Payer: Medicare Other | Admitting: Family Medicine

## 2012-09-20 VITALS — BP 136/91 | HR 94 | Ht 62.0 in | Wt 178.0 lb

## 2012-09-20 DIAGNOSIS — R1084 Generalized abdominal pain: Secondary | ICD-10-CM | POA: Diagnosis not present

## 2012-09-20 DIAGNOSIS — K7689 Other specified diseases of liver: Secondary | ICD-10-CM | POA: Diagnosis not present

## 2012-09-20 DIAGNOSIS — E119 Type 2 diabetes mellitus without complications: Secondary | ICD-10-CM | POA: Diagnosis not present

## 2012-09-20 DIAGNOSIS — Z8739 Personal history of other diseases of the musculoskeletal system and connective tissue: Secondary | ICD-10-CM | POA: Diagnosis not present

## 2012-09-20 DIAGNOSIS — R1011 Right upper quadrant pain: Secondary | ICD-10-CM | POA: Diagnosis not present

## 2012-09-20 DIAGNOSIS — Z880 Allergy status to penicillin: Secondary | ICD-10-CM | POA: Diagnosis not present

## 2012-09-20 DIAGNOSIS — R1012 Left upper quadrant pain: Secondary | ICD-10-CM | POA: Diagnosis not present

## 2012-09-20 DIAGNOSIS — I10 Essential (primary) hypertension: Secondary | ICD-10-CM

## 2012-09-20 DIAGNOSIS — Z862 Personal history of diseases of the blood and blood-forming organs and certain disorders involving the immune mechanism: Secondary | ICD-10-CM | POA: Diagnosis not present

## 2012-09-20 DIAGNOSIS — Z888 Allergy status to other drugs, medicaments and biological substances status: Secondary | ICD-10-CM | POA: Diagnosis not present

## 2012-09-20 DIAGNOSIS — Z886 Allergy status to analgesic agent status: Secondary | ICD-10-CM | POA: Diagnosis not present

## 2012-09-20 DIAGNOSIS — Z79899 Other long term (current) drug therapy: Secondary | ICD-10-CM | POA: Diagnosis not present

## 2012-09-20 DIAGNOSIS — Z882 Allergy status to sulfonamides status: Secondary | ICD-10-CM | POA: Diagnosis not present

## 2012-09-20 DIAGNOSIS — J45909 Unspecified asthma, uncomplicated: Secondary | ICD-10-CM | POA: Diagnosis not present

## 2012-09-20 DIAGNOSIS — Z881 Allergy status to other antibiotic agents status: Secondary | ICD-10-CM | POA: Diagnosis not present

## 2012-09-20 MED ORDER — AMLODIPINE BESYLATE 2.5 MG PO TABS: 2.5000 mg | ORAL_TABLET | Freq: Every day | ORAL | Status: DC

## 2012-09-20 NOTE — Progress Notes (Signed)
  Subjective:    Patient ID: Jennifer [name], female    DOB: 05-08-1966, 47 y.o.   MRN: [id number]  HPI Has been sick in bed since her endoscopy. She has some RUQ pain. She feels bloated. She's been belching more. Evidently her endoscopy though it well. She also had a sigmoidoscopy but not a full colonoscopy. Had sharp pain on her left side yesterday that was severe. She says it shot up into her left breast. No nausea or vomiting. Had to sleep on her right side side because of discomfort on the left. No fever. No change in bowels. No vomiting.  Hypertension-she feels she doesn't tolerate the Coreg. Felt the coreg was causing chest pain and SOB. Feels her BP went up as well.  Went to ED yesterday because of the chest tightness and some shortness of breath. Stopped the coreg.   She did not take any today.    Review of Systems     Objective:   Physical Exam  Constitutional: She is oriented to person, place, and time. She appears well-developed and well-nourished.  HENT:  Head: Normocephalic and atraumatic.  Cardiovascular: Normal rate, regular rhythm and normal heart sounds.   Pulmonary/Chest: Effort normal and breath sounds normal.  Abdominal: Soft. Bowel sounds are normal. She exhibits no distension and no mass. There is tenderness. There is no rebound and no guarding.       Mild tenderness in the right and left upper quadrants and epigastrium. Increased tympany.  Musculoskeletal: She exhibits no edema.  Neurological: She is alert and oriented to person, place, and time.  Skin: Skin is warm and dry.  Psychiatric: She has a normal mood and affect. Her behavior is normal.          Assessment & Plan:  Abdominal Pain- I suspect this may be secondary to bloating and gas after her endoscopy and sigmoidoscopy. I see nothing on exam that worries some. I will get blood work to evaluate for infection with a CBC. Will check amylase andt lipase  evaluate for pancreatitis.  Also check liver  enzymes. Avoid anything greasy or spicy and make sure eating a bland soft diet.  HTN - uncontrolled. It does seem to be fluctuating. Her biggest frustration is that it tends to go up and down. It is when she has taken medication for it. Time she still has spikes in her blood pressure. I spent a lot of time trying to reassure her today that even people who take blood pressure medications daily still has spikes in her blood pressures. Oftentimes the patient's demeanor walks there would be occasional spikes which can be explained by other factors such as taking anti-inflammatories, caffeine, increase in salt intake etc. They're semi-factors that can contribute that it does not necessarily mean that the blood pressure pill is no longer working. Anxiety is a big trigger for her. She has tried amlodipine in the past and over all did well with it but felt like it could be causing some palpitations. Though she has palpitations even when she's not on blood pressure medication. Thus I encouraged her to continue trying it again. Certainly we will keep an eye on the palpitations but just because she has some it doesn't mean necessarily coming from the medication. We'll start with amlodipine 2.5 mg daily and I would like to see her back in 3 weeks.  Time spent 30 min, >50% spent in counseling for BP and abdominal pain.

## 2012-09-21 LAB — COMPLETE METABOLIC PANEL WITH GFR
ALT: 21 U/L (ref 0–35)
AST: 17 U/L (ref 0–37)
Albumin: 4.5 g/dL (ref 3.5–5.2)
Alkaline Phosphatase: 60 U/L (ref 39–117)
BUN: 16 mg/dL (ref 6–23)
CO2: 25 mEq/L (ref 19–32)
Calcium: 9.3 mg/dL (ref 8.4–10.5)
Chloride: 106 mEq/L (ref 96–112)
Creat: 0.52 mg/dL (ref 0.50–1.10)
GFR, Est African American: >89 mL/min
GFR, Est Non African American: >89 mL/min
Glucose, Bld: 91 mg/dL (ref 70–99)
Potassium: 3.8 mEq/L (ref 3.5–5.3)
Sodium: 140 mEq/L (ref 135–145)
Total Bilirubin: 0.2 mg/dL — ABNORMAL LOW (ref 0.3–1.2)
Total Protein: 7 g/dL (ref 6.0–8.3)

## 2012-09-21 LAB — AMYLASE: Amylase: 50 U/L (ref 0–105)

## 2012-09-21 LAB — CBC WITH DIFFERENTIAL/PLATELET
Basophils Absolute: 0 10*3/uL (ref 0.0–0.1)
Basophils Relative: 0 % (ref 0–1)
Eosinophils Absolute: 0.2 10*3/uL (ref 0.0–0.7)
Eosinophils Relative: 3 % (ref 0–5)
HCT: 43.3 % (ref 36.0–46.0)
Hemoglobin: 14.5 g/dL (ref 12.0–15.0)
Lymphocytes Relative: 22 % (ref 12–46)
Lymphs Abs: 1.7 10*3/uL (ref 0.7–4.0)
MCH: 29.7 pg (ref 26.0–34.0)
MCHC: 33.5 g/dL (ref 30.0–36.0)
MCV: 88.5 fL (ref 78.0–100.0)
Monocytes Absolute: 0.7 10*3/uL (ref 0.1–1.0)
Monocytes Relative: 9 % (ref 3–12)
Neutro Abs: 5.1 10*3/uL (ref 1.7–7.7)
Neutrophils Relative %: 66 % (ref 43–77)
Platelets: 254 10*3/uL (ref 150–400)
RBC: 4.89 MIL/uL (ref 3.87–5.11)
RDW: 15 % (ref 11.5–15.5)
WBC: 7.7 10*3/uL (ref 4.0–10.5)

## 2012-09-21 LAB — LIPASE: Lipase: 37 U/L (ref 0–75)

## 2012-09-22 DIAGNOSIS — I10 Essential (primary) hypertension: Secondary | ICD-10-CM | POA: Diagnosis not present

## 2012-09-22 DIAGNOSIS — K5900 Constipation, unspecified: Secondary | ICD-10-CM | POA: Diagnosis not present

## 2012-09-22 DIAGNOSIS — R110 Nausea: Secondary | ICD-10-CM | POA: Diagnosis not present

## 2012-09-22 DIAGNOSIS — Z79899 Other long term (current) drug therapy: Secondary | ICD-10-CM | POA: Diagnosis not present

## 2012-09-22 DIAGNOSIS — R109 Unspecified abdominal pain: Secondary | ICD-10-CM | POA: Diagnosis not present

## 2012-09-23 DIAGNOSIS — F339 Major depressive disorder, recurrent, unspecified: Secondary | ICD-10-CM | POA: Diagnosis not present

## 2012-09-24 ENCOUNTER — Ambulatory Visit: Payer: Medicare Other | Admitting: Family Medicine

## 2012-09-24 DIAGNOSIS — Z888 Allergy status to other drugs, medicaments and biological substances status: Secondary | ICD-10-CM | POA: Diagnosis not present

## 2012-09-24 DIAGNOSIS — G894 Chronic pain syndrome: Secondary | ICD-10-CM | POA: Diagnosis not present

## 2012-09-24 DIAGNOSIS — R079 Chest pain, unspecified: Secondary | ICD-10-CM | POA: Diagnosis not present

## 2012-09-24 DIAGNOSIS — I10 Essential (primary) hypertension: Secondary | ICD-10-CM | POA: Diagnosis not present

## 2012-09-24 DIAGNOSIS — R1013 Epigastric pain: Secondary | ICD-10-CM | POA: Diagnosis not present

## 2012-09-24 DIAGNOSIS — Z79899 Other long term (current) drug therapy: Secondary | ICD-10-CM | POA: Diagnosis not present

## 2012-09-24 DIAGNOSIS — Z8673 Personal history of transient ischemic attack (TIA), and cerebral infarction without residual deficits: Secondary | ICD-10-CM | POA: Diagnosis not present

## 2012-09-25 DIAGNOSIS — F339 Major depressive disorder, recurrent, unspecified: Secondary | ICD-10-CM | POA: Diagnosis not present

## 2012-09-26 DIAGNOSIS — R109 Unspecified abdominal pain: Secondary | ICD-10-CM | POA: Diagnosis not present

## 2012-09-26 DIAGNOSIS — Z9889 Other specified postprocedural states: Secondary | ICD-10-CM | POA: Diagnosis not present

## 2012-09-26 DIAGNOSIS — Z09 Encounter for follow-up examination after completed treatment for conditions other than malignant neoplasm: Secondary | ICD-10-CM | POA: Diagnosis not present

## 2012-09-26 DIAGNOSIS — R933 Abnormal findings on diagnostic imaging of other parts of digestive tract: Secondary | ICD-10-CM | POA: Diagnosis not present

## 2012-09-28 ENCOUNTER — Other Ambulatory Visit: Payer: Self-pay

## 2012-09-30 DIAGNOSIS — R1084 Generalized abdominal pain: Secondary | ICD-10-CM | POA: Diagnosis not present

## 2012-09-30 DIAGNOSIS — G894 Chronic pain syndrome: Secondary | ICD-10-CM | POA: Diagnosis not present

## 2012-09-30 DIAGNOSIS — X19XXXA Contact with other heat and hot substances, initial encounter: Secondary | ICD-10-CM | POA: Diagnosis not present

## 2012-09-30 DIAGNOSIS — Z881 Allergy status to other antibiotic agents status: Secondary | ICD-10-CM | POA: Diagnosis not present

## 2012-09-30 DIAGNOSIS — R1013 Epigastric pain: Secondary | ICD-10-CM | POA: Diagnosis not present

## 2012-09-30 DIAGNOSIS — T310 Burns involving less than 10% of body surface: Secondary | ICD-10-CM | POA: Diagnosis not present

## 2012-09-30 DIAGNOSIS — Z888 Allergy status to other drugs, medicaments and biological substances status: Secondary | ICD-10-CM | POA: Diagnosis not present

## 2012-09-30 DIAGNOSIS — T23209A Burn of second degree of unspecified hand, unspecified site, initial encounter: Secondary | ICD-10-CM | POA: Diagnosis not present

## 2012-09-30 DIAGNOSIS — I10 Essential (primary) hypertension: Secondary | ICD-10-CM | POA: Diagnosis not present

## 2012-09-30 DIAGNOSIS — Z882 Allergy status to sulfonamides status: Secondary | ICD-10-CM | POA: Diagnosis not present

## 2012-10-03 DIAGNOSIS — R109 Unspecified abdominal pain: Secondary | ICD-10-CM | POA: Diagnosis not present

## 2012-10-03 DIAGNOSIS — K589 Irritable bowel syndrome without diarrhea: Secondary | ICD-10-CM | POA: Diagnosis not present

## 2012-10-03 IMAGING — CR DG CHEST 2V
2 series · 2 of 2 positions shown · non-contrast
Comparison: 05/30/2011

CLINICAL DATA: Cough, right chest pain

CHEST - 2 VIEW

[w chest pa]
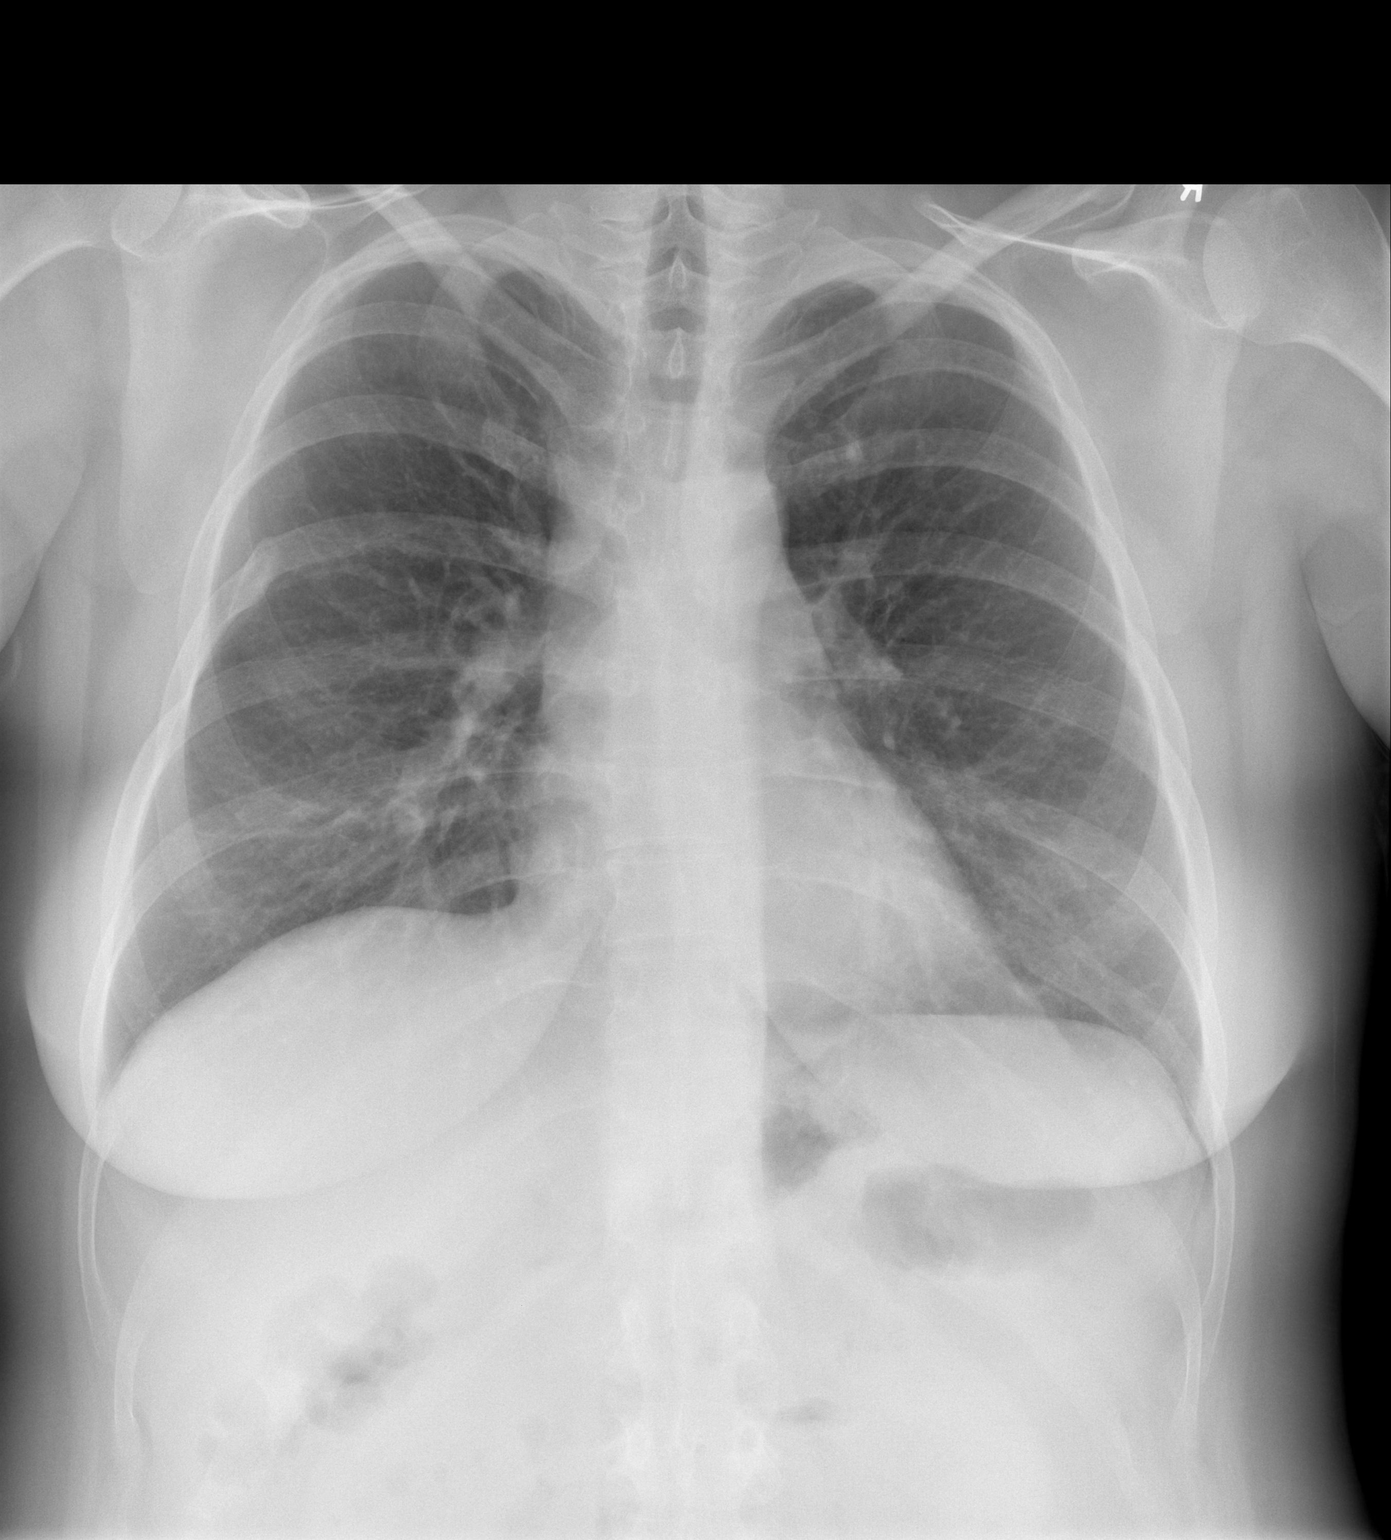

[w chest lat]
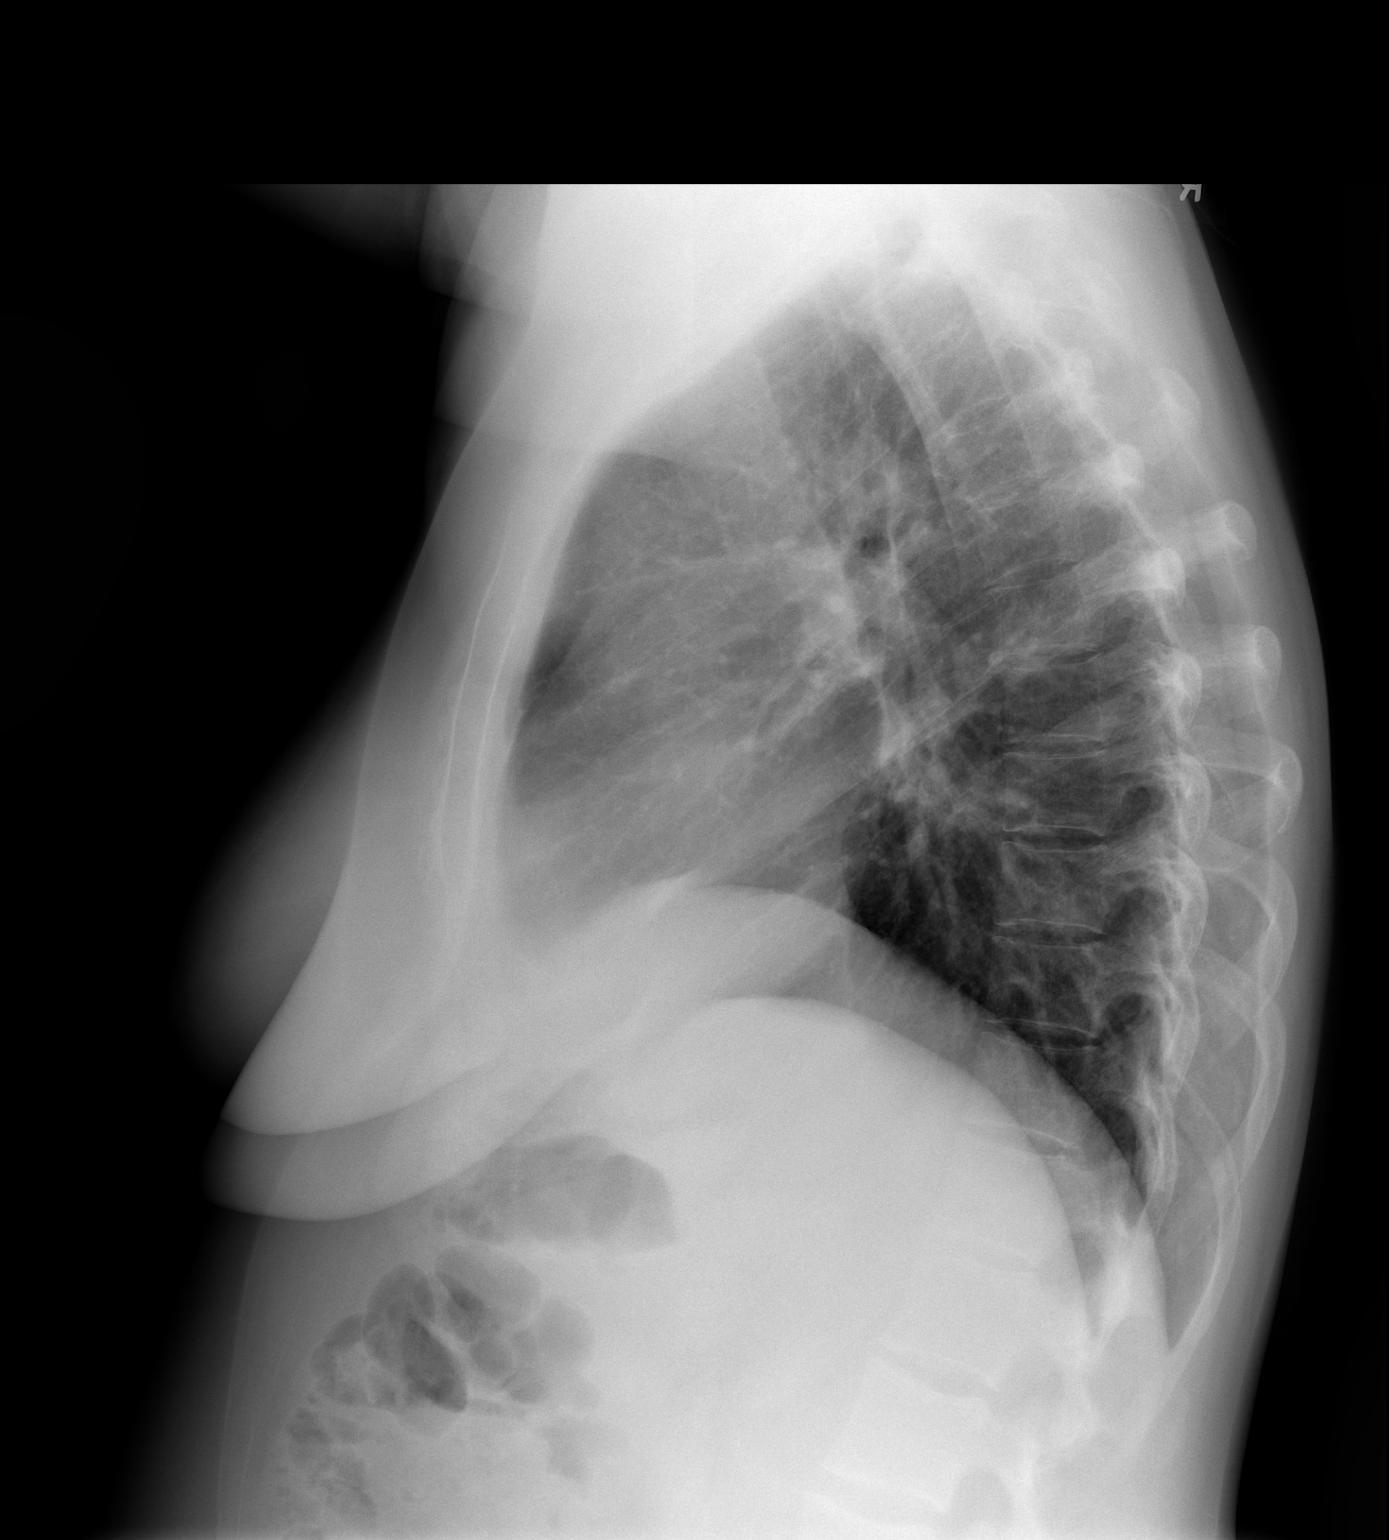

[2 of 2 positions shown; findings below may reference images not displayed]

FINDINGS: Normal heart size and vascularity.  Healed right
posterior sixth rib fracture.  No focal pneumonia, edema, collapse,
solid CHF, effusion or pneumothorax.  Trachea midline.  Stable
exam.
IMPRESSION: No acute chest process.

## 2012-10-08 ENCOUNTER — Encounter: Payer: Self-pay | Admitting: Family Medicine

## 2012-10-08 ENCOUNTER — Ambulatory Visit (INDEPENDENT_AMBULATORY_CARE_PROVIDER_SITE_OTHER): Payer: Medicare Other | Admitting: Family Medicine

## 2012-10-08 VITALS — BP 157/99 | HR 120 | Wt 180.0 lb

## 2012-10-08 DIAGNOSIS — F32A Depression, unspecified: Secondary | ICD-10-CM

## 2012-10-08 DIAGNOSIS — R202 Paresthesia of skin: Secondary | ICD-10-CM

## 2012-10-08 DIAGNOSIS — J019 Acute sinusitis, unspecified: Secondary | ICD-10-CM

## 2012-10-08 DIAGNOSIS — F329 Major depressive disorder, single episode, unspecified: Secondary | ICD-10-CM

## 2012-10-08 DIAGNOSIS — R209 Unspecified disturbances of skin sensation: Secondary | ICD-10-CM

## 2012-10-08 DIAGNOSIS — F3289 Other specified depressive episodes: Secondary | ICD-10-CM | POA: Diagnosis not present

## 2012-10-08 DIAGNOSIS — F319 Bipolar disorder, unspecified: Secondary | ICD-10-CM | POA: Diagnosis not present

## 2012-10-08 DIAGNOSIS — T23202D Burn of second degree of left hand, unspecified site, subsequent encounter: Secondary | ICD-10-CM

## 2012-10-08 MED ORDER — LEVOFLOXACIN 250 MG PO TABS: 250.0000 mg | ORAL_TABLET | Freq: Every day | ORAL | Status: DC

## 2012-10-08 NOTE — Patient Instructions (Signed)
Sinusitis Sinusitis is redness, soreness, and swelling (inflammation) of the paranasal sinuses. Paranasal sinuses are air pockets within the bones of your face (beneath the eyes, the middle of the forehead, or above the eyes). In healthy paranasal sinuses, mucus is able to drain out, and air is able to circulate through them by way of your nose. However, when your paranasal sinuses are inflamed, mucus and air can become trapped. This can allow bacteria and other germs to grow and cause infection. Sinusitis can develop quickly and last only a short time (acute) or continue over a long period (chronic). Sinusitis that lasts for more than 12 weeks is considered chronic.  CAUSES  Causes of sinusitis include:  Allergies.  Structural abnormalities, such as displacement of the cartilage that separates your nostrils (deviated septum), which can decrease the air flow through your nose and sinuses and affect sinus drainage.  Functional abnormalities, such as when the small hairs (cilia) that line your sinuses and help remove mucus do not work properly or are not present. SYMPTOMS  Symptoms of acute and chronic sinusitis are the same. The primary symptoms are pain and pressure around the affected sinuses. Other symptoms include:  Upper toothache.  Earache.  Headache.  Bad breath.  Decreased sense of smell and taste.  A cough, which worsens when you are lying flat.  Fatigue.  Fever.  Thick drainage from your nose, which often is green and may contain pus (purulent).  Swelling and warmth over the affected sinuses. DIAGNOSIS  Your caregiver will perform a physical exam. During the exam, your caregiver may:  Look in your nose for signs of abnormal growths in your nostrils (nasal polyps).  Tap over the affected sinus to check for signs of infection.  View the inside of your sinuses (endoscopy) with a special imaging device with a light attached (endoscope), which is inserted into your  sinuses. If your caregiver suspects that you have chronic sinusitis, one or more of the following tests may be recommended:  Allergy tests.  Nasal culture A sample of mucus is taken from your nose and sent to a lab and screened for bacteria.  Nasal cytology A sample of mucus is taken from your nose and examined by your caregiver to determine if your sinusitis is related to an allergy. TREATMENT  Most cases of acute sinusitis are related to a viral infection and will resolve on their own within 10 days. Sometimes medicines are prescribed to help relieve symptoms (pain medicine, decongestants, nasal steroid sprays, or saline sprays).  However, for sinusitis related to a bacterial infection, your caregiver will prescribe antibiotic medicines. These are medicines that will help kill the bacteria causing the infection.  Rarely, sinusitis is caused by a fungal infection. In theses cases, your caregiver will prescribe antifungal medicine. For some cases of chronic sinusitis, surgery is needed. Generally, these are cases in which sinusitis recurs more than 3 times per year, despite other treatments. HOME CARE INSTRUCTIONS   Drink plenty of water. Water helps thin the mucus so your sinuses can drain more easily.  Use a humidifier.  Inhale steam 3 to 4 times a day (for example, sit in the bathroom with the shower running).  Apply a warm, moist washcloth to your face 3 to 4 times a day, or as directed by your caregiver.  Use saline nasal sprays to help moisten and clean your sinuses.  Take over-the-counter or prescription medicines for pain, discomfort, or fever only as directed by your caregiver. SEEK IMMEDIATE MEDICAL   CARE IF:  You have increasing pain or severe headaches.  You have nausea, vomiting, or drowsiness.  You have swelling around your face.  You have vision problems.  You have a stiff neck.  You have difficulty breathing. MAKE SURE YOU:   Understand these  instructions.  Will watch your condition.  Will get help right away if you are not doing well or get worse. Document Released: 07/31/2005 Document Revised: 10/23/2011 Document Reviewed: 08/15/2011 [employer] Patient Information 2013 [employer].  
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
## 2012-10-09 DIAGNOSIS — F339 Major depressive disorder, recurrent, unspecified: Secondary | ICD-10-CM | POA: Diagnosis not present

## 2012-10-09 IMAGING — CT CT ANGIO HEAD
3 of 9 series · 18 of 47 positions shown · IV contrast (APPLIED)
Comparison: Several prior head CTs most recent 01/28/2011

CLINICAL DATA: Atypical frontal headaches with occasional double
vision.

CT ANGIOGRAPHY HEAD
TECHNIQUE: Multidetector CT imaging of the head was performed
using the standard protocol during bolus administration of
intravenous contrast.  Multiplanar CT image reconstructions
including MIPs were obtained to evaluate the vascular anatomy.
Contrast: 98mL OMNIPAQUE IOHEXOL 350 MG/ML IV SOLN

[Series 11: cor thin · coronal · 0.32mm/px · 3 of 195 slices shown]
[im 56/195  brain]
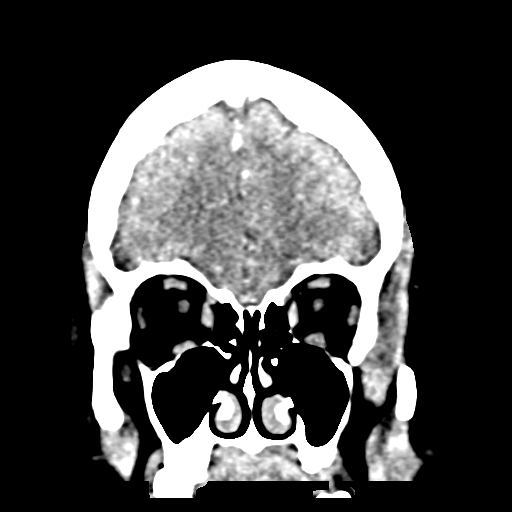
[im 84/195  brain]
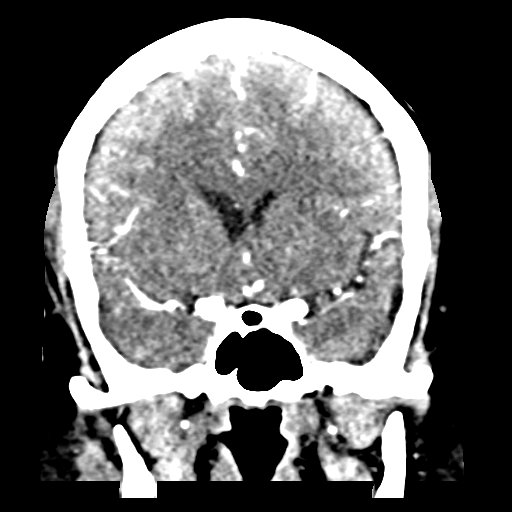
[im 111/195  brain]
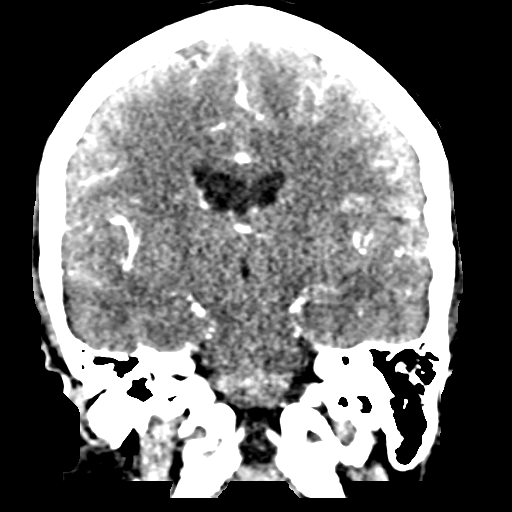

[Series 12: sag thin · sagittal · 0.33mm/px · 3 of 145 slices shown]
[im 29/145  brain]
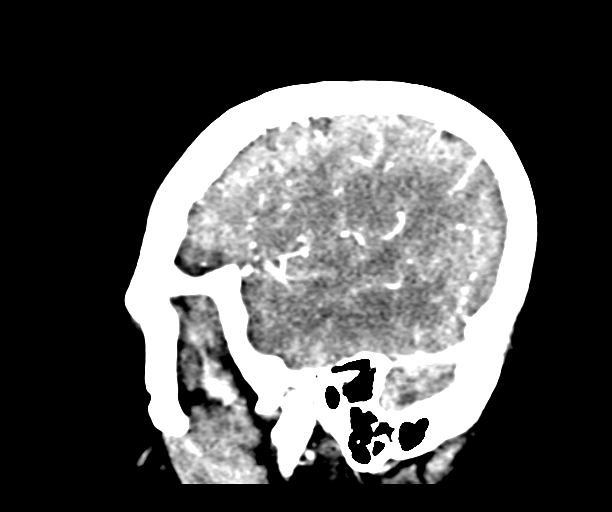
[im 58/145  brain]
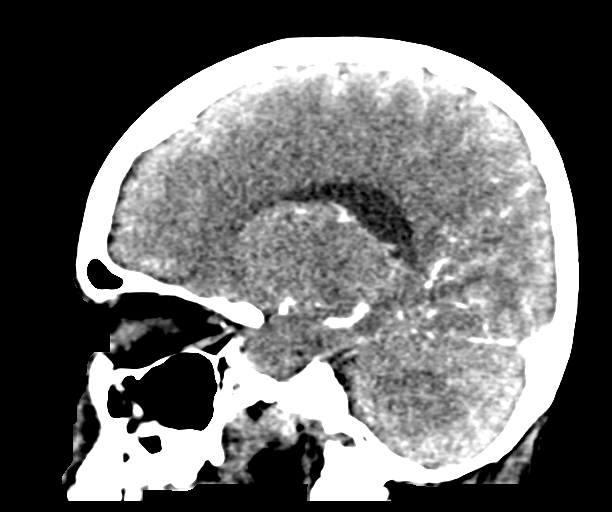
[im 87/145  brain]
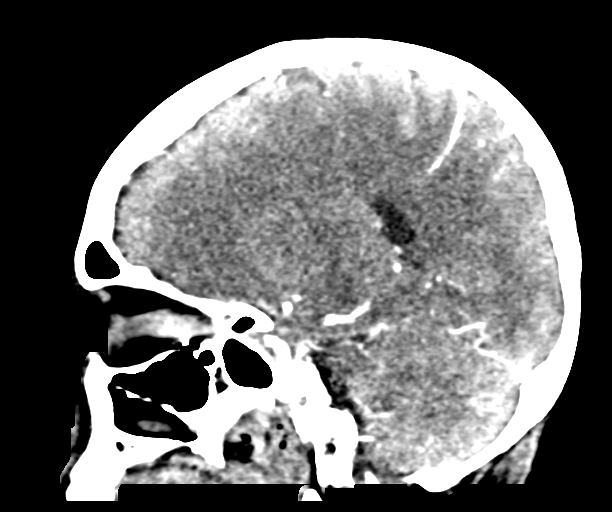

[Series 15: ax thin · axial · 0.39mm/px · z∈[-212,-78]mm · 12 of 160 slices shown]
[im 13/160  brain]
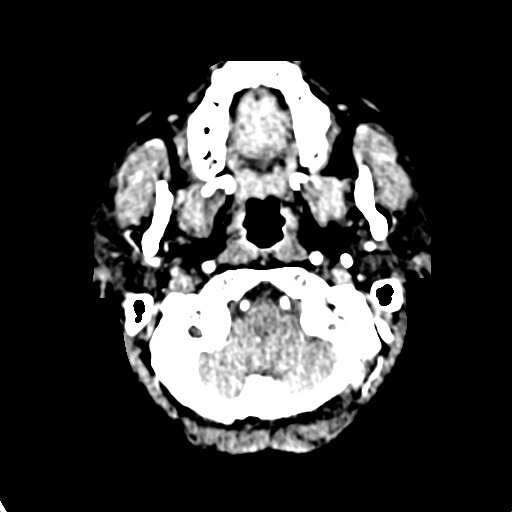
[im 25/160  bone]
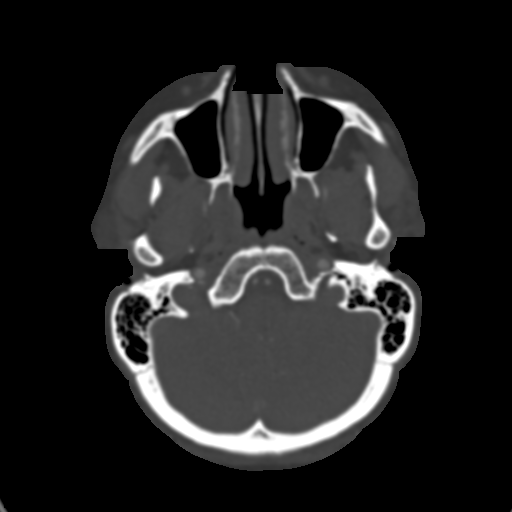
[im 37/160  brain]
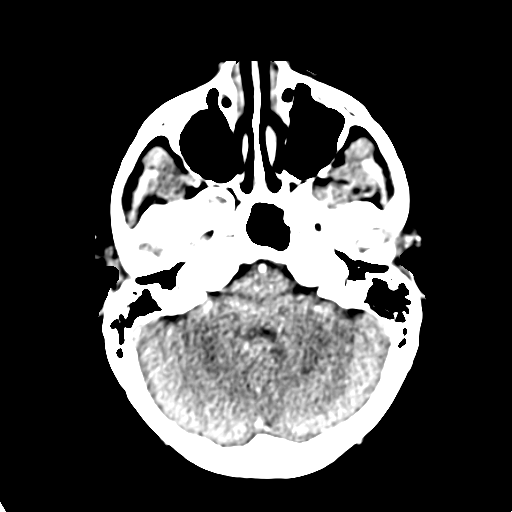
[im 49/160  bone]
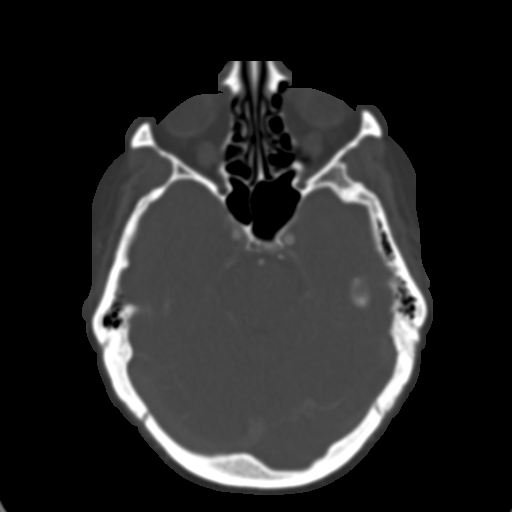
[im 62/160  brain]
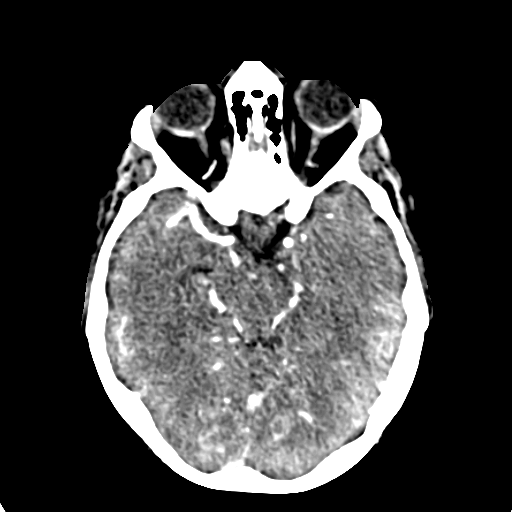
[im 74/160  bone]
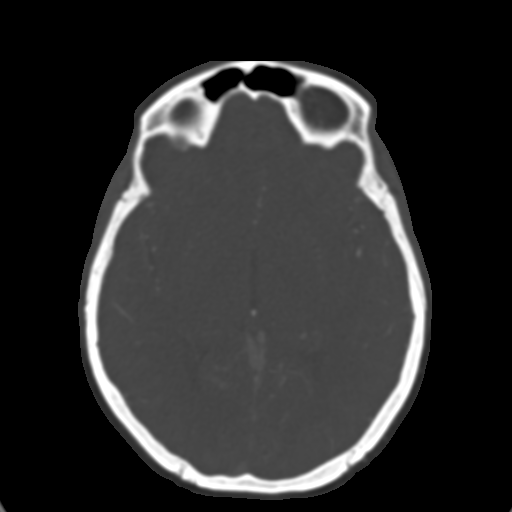
[im 86/160  brain]
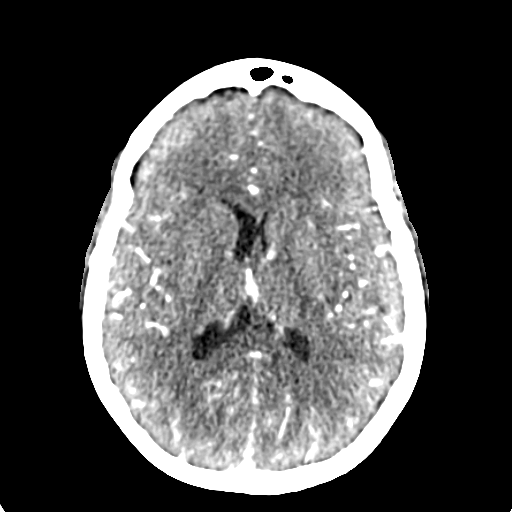
[im 98/160  bone]
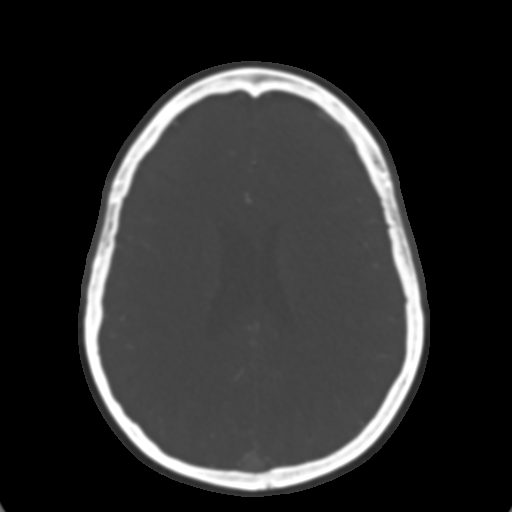
[im 111/160  brain]
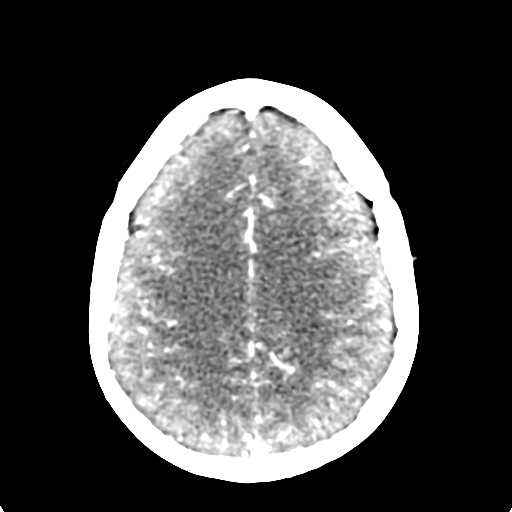
[im 123/160  bone]
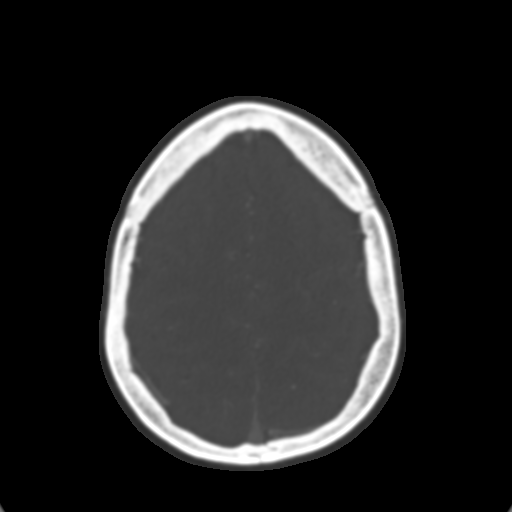
[im 135/160  brain]
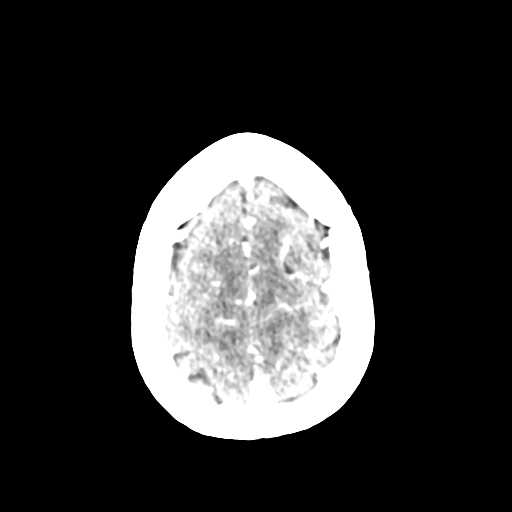
[im 147/160  bone]
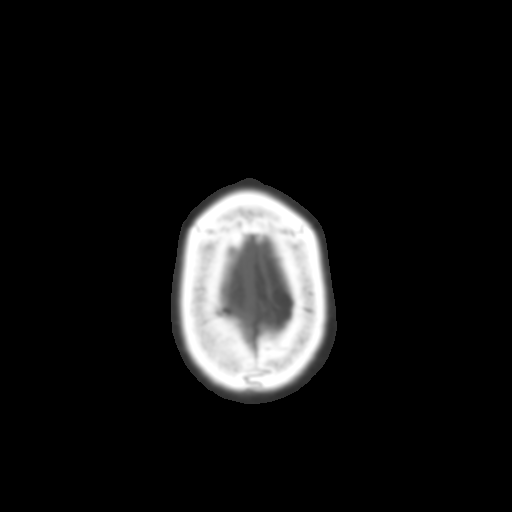

[18 of 47 positions shown; findings below may reference images not displayed]

FINDINGS: No intracranial hemorrhage.

No intracranial mass or abnormal enhancement.

Mild asymmetry of the lateral ventricles stable.  No hydrocephalus.

No CT evidence of large acute infarct.  Small acute infarct cannot
be excluded by CT.

Minimal calcification cavernous segment internal carotid artery
bilaterally.

No high-grade stenosis of medium or large size intracranial
vasculature.

No aneurysm or vascular malformation detected.

Orbital structures unremarkable.

 Review of the MIP images confirms the above findings.
IMPRESSION: No intracranial hemorrhage.

No intracranial mass or abnormal enhancement.

No CT evidence of large acute infarct.

Minimal calcification cavernous segment internal carotid artery
bilaterally.

No high-grade stenosis of medium or large size intracranial
vasculature.

No aneurysm or vascular malformation detected.

Orbital structures unremarkable.

## 2012-10-11 DIAGNOSIS — J45909 Unspecified asthma, uncomplicated: Secondary | ICD-10-CM | POA: Diagnosis not present

## 2012-10-11 DIAGNOSIS — R059 Cough, unspecified: Secondary | ICD-10-CM | POA: Diagnosis not present

## 2012-10-11 DIAGNOSIS — R05 Cough: Secondary | ICD-10-CM | POA: Diagnosis not present

## 2012-10-11 DIAGNOSIS — J309 Allergic rhinitis, unspecified: Secondary | ICD-10-CM | POA: Diagnosis not present

## 2012-10-14 DIAGNOSIS — Z79899 Other long term (current) drug therapy: Secondary | ICD-10-CM | POA: Diagnosis not present

## 2012-10-14 DIAGNOSIS — Z8673 Personal history of transient ischemic attack (TIA), and cerebral infarction without residual deficits: Secondary | ICD-10-CM | POA: Diagnosis not present

## 2012-10-14 DIAGNOSIS — R51 Headache: Secondary | ICD-10-CM | POA: Diagnosis not present

## 2012-10-14 DIAGNOSIS — R0789 Other chest pain: Secondary | ICD-10-CM | POA: Diagnosis not present

## 2012-10-14 DIAGNOSIS — R079 Chest pain, unspecified: Secondary | ICD-10-CM | POA: Diagnosis not present

## 2012-10-14 DIAGNOSIS — I10 Essential (primary) hypertension: Secondary | ICD-10-CM | POA: Diagnosis not present

## 2012-10-15 DIAGNOSIS — R569 Unspecified convulsions: Secondary | ICD-10-CM | POA: Diagnosis not present

## 2012-10-16 DIAGNOSIS — F339 Major depressive disorder, recurrent, unspecified: Secondary | ICD-10-CM | POA: Diagnosis not present

## 2012-10-16 DIAGNOSIS — J3489 Other specified disorders of nose and nasal sinuses: Secondary | ICD-10-CM | POA: Diagnosis not present

## 2012-10-16 DIAGNOSIS — F411 Generalized anxiety disorder: Secondary | ICD-10-CM | POA: Diagnosis not present

## 2012-10-16 DIAGNOSIS — R1310 Dysphagia, unspecified: Secondary | ICD-10-CM | POA: Diagnosis not present

## 2012-10-16 DIAGNOSIS — R059 Cough, unspecified: Secondary | ICD-10-CM | POA: Diagnosis not present

## 2012-10-16 DIAGNOSIS — F458 Other somatoform disorders: Secondary | ICD-10-CM | POA: Diagnosis not present

## 2012-10-17 DIAGNOSIS — D1803 Hemangioma of intra-abdominal structures: Secondary | ICD-10-CM | POA: Diagnosis not present

## 2012-10-17 DIAGNOSIS — R0609 Other forms of dyspnea: Secondary | ICD-10-CM | POA: Diagnosis not present

## 2012-10-17 DIAGNOSIS — J310 Chronic rhinitis: Secondary | ICD-10-CM | POA: Diagnosis not present

## 2012-10-17 DIAGNOSIS — J343 Hypertrophy of nasal turbinates: Secondary | ICD-10-CM | POA: Diagnosis not present

## 2012-10-17 DIAGNOSIS — R0989 Other specified symptoms and signs involving the circulatory and respiratory systems: Secondary | ICD-10-CM | POA: Diagnosis not present

## 2012-10-17 DIAGNOSIS — K219 Gastro-esophageal reflux disease without esophagitis: Secondary | ICD-10-CM | POA: Diagnosis not present

## 2012-10-17 DIAGNOSIS — D1809 Hemangioma of other sites: Secondary | ICD-10-CM | POA: Diagnosis not present

## 2012-10-17 DIAGNOSIS — R109 Unspecified abdominal pain: Secondary | ICD-10-CM | POA: Diagnosis not present

## 2012-10-17 DIAGNOSIS — G4733 Obstructive sleep apnea (adult) (pediatric): Secondary | ICD-10-CM | POA: Diagnosis not present

## 2012-10-17 DIAGNOSIS — R040 Epistaxis: Secondary | ICD-10-CM | POA: Diagnosis not present

## 2012-10-23 ENCOUNTER — Emergency Department (HOSPITAL_BASED_OUTPATIENT_CLINIC_OR_DEPARTMENT_OTHER): Payer: Medicare Other

## 2012-10-23 ENCOUNTER — Encounter (HOSPITAL_BASED_OUTPATIENT_CLINIC_OR_DEPARTMENT_OTHER): Payer: Self-pay | Admitting: *Deleted

## 2012-10-23 ENCOUNTER — Emergency Department (HOSPITAL_BASED_OUTPATIENT_CLINIC_OR_DEPARTMENT_OTHER)
Admission: EM | Admit: 2012-10-23 | Discharge: 2012-10-23 | Disposition: A | Discharge status: Home / Self Care | Payer: Medicare Other | Attending: Emergency Medicine | Admitting: Emergency Medicine

## 2012-10-23 DIAGNOSIS — B9689 Other specified bacterial agents as the cause of diseases classified elsewhere: Secondary | ICD-10-CM

## 2012-10-23 DIAGNOSIS — K219 Gastro-esophageal reflux disease without esophagitis: Secondary | ICD-10-CM | POA: Diagnosis not present

## 2012-10-23 DIAGNOSIS — N760 Acute vaginitis: Secondary | ICD-10-CM | POA: Diagnosis not present

## 2012-10-23 DIAGNOSIS — Z79899 Other long term (current) drug therapy: Secondary | ICD-10-CM | POA: Diagnosis not present

## 2012-10-23 DIAGNOSIS — E785 Hyperlipidemia, unspecified: Secondary | ICD-10-CM | POA: Diagnosis not present

## 2012-10-23 DIAGNOSIS — Z87448 Personal history of other diseases of urinary system: Secondary | ICD-10-CM | POA: Diagnosis not present

## 2012-10-23 DIAGNOSIS — Z8669 Personal history of other diseases of the nervous system and sense organs: Secondary | ICD-10-CM | POA: Insufficient documentation

## 2012-10-23 DIAGNOSIS — R110 Nausea: Secondary | ICD-10-CM | POA: Diagnosis not present

## 2012-10-23 DIAGNOSIS — R109 Unspecified abdominal pain: Secondary | ICD-10-CM | POA: Diagnosis not present

## 2012-10-23 DIAGNOSIS — R1033 Periumbilical pain: Secondary | ICD-10-CM | POA: Insufficient documentation

## 2012-10-23 DIAGNOSIS — Z3202 Encounter for pregnancy test, result negative: Secondary | ICD-10-CM | POA: Insufficient documentation

## 2012-10-23 DIAGNOSIS — Z8679 Personal history of other diseases of the circulatory system: Secondary | ICD-10-CM | POA: Diagnosis not present

## 2012-10-23 DIAGNOSIS — J45909 Unspecified asthma, uncomplicated: Secondary | ICD-10-CM | POA: Diagnosis not present

## 2012-10-23 DIAGNOSIS — R410 Disorientation, unspecified: Secondary | ICD-10-CM | POA: Diagnosis not present

## 2012-10-23 DIAGNOSIS — Z8659 Personal history of other mental and behavioral disorders: Secondary | ICD-10-CM | POA: Diagnosis not present

## 2012-10-23 DIAGNOSIS — Z862 Personal history of diseases of the blood and blood-forming organs and certain disorders involving the immune mechanism: Secondary | ICD-10-CM | POA: Diagnosis not present

## 2012-10-23 DIAGNOSIS — I10 Essential (primary) hypertension: Secondary | ICD-10-CM | POA: Diagnosis not present

## 2012-10-23 DIAGNOSIS — D1803 Hemangioma of intra-abdominal structures: Secondary | ICD-10-CM | POA: Diagnosis not present

## 2012-10-23 DIAGNOSIS — Z8639 Personal history of other endocrine, nutritional and metabolic disease: Secondary | ICD-10-CM | POA: Insufficient documentation

## 2012-10-23 DIAGNOSIS — F411 Generalized anxiety disorder: Secondary | ICD-10-CM | POA: Diagnosis not present

## 2012-10-23 DIAGNOSIS — D259 Leiomyoma of uterus, unspecified: Secondary | ICD-10-CM | POA: Diagnosis not present

## 2012-10-23 LAB — COMPREHENSIVE METABOLIC PANEL
ALT: 23 U/L (ref 0–35)
AST: 18 U/L (ref 0–37)
Albumin: 3.7 g/dL (ref 3.5–5.2)
Alkaline Phosphatase: 59 U/L (ref 39–117)
BUN: 9 mg/dL (ref 6–23)
CO2: 25 mEq/L (ref 19–32)
Calcium: 9.1 mg/dL (ref 8.4–10.5)
Chloride: 104 mEq/L (ref 96–112)
Creatinine, Ser: 0.6 mg/dL (ref 0.50–1.10)
GFR calc Af Amer: >90 mL/min (ref >90)
GFR calc non Af Amer: >90 mL/min (ref >90)
Glucose, Bld: 107 mg/dL — ABNORMAL HIGH (ref 70–99)
Potassium: 3.6 mEq/L (ref 3.5–5.1)
Sodium: 140 mEq/L (ref 135–145)
Total Bilirubin: 0.2 mg/dL — ABNORMAL LOW (ref 0.3–1.2)
Total Protein: 7.1 g/dL (ref 6.0–8.3)

## 2012-10-23 LAB — URINALYSIS, ROUTINE W REFLEX MICROSCOPIC
Bilirubin Urine: NEGATIVE
Glucose, UA: NEGATIVE mg/dL
Hgb urine dipstick: NEGATIVE
Ketones, ur: NEGATIVE mg/dL
Leukocytes, UA: NEGATIVE
Nitrite: NEGATIVE
Protein, ur: NEGATIVE mg/dL
Specific Gravity, Urine: 1.02 (ref 1.005–1.030)
Urobilinogen, UA: 1 mg/dL (ref 0.0–1.0)
pH: 7.5 (ref 5.0–8.0)

## 2012-10-23 LAB — CBC WITH DIFFERENTIAL/PLATELET
Basophils Absolute: 0 10*3/uL (ref 0.0–0.1)
Basophils Relative: 0 % (ref 0–1)
Eosinophils Absolute: 0.2 10*3/uL (ref 0.0–0.7)
Eosinophils Relative: 2 % (ref 0–5)
HCT: 41.9 % (ref 36.0–46.0)
Hemoglobin: 14.4 g/dL (ref 12.0–15.0)
Lymphocytes Relative: 24 % (ref 12–46)
Lymphs Abs: 1.7 10*3/uL (ref 0.7–4.0)
MCH: 30.4 pg (ref 26.0–34.0)
MCHC: 34.4 g/dL (ref 30.0–36.0)
MCV: 88.4 fL (ref 78.0–100.0)
Monocytes Absolute: 0.7 10*3/uL (ref 0.1–1.0)
Monocytes Relative: 10 % (ref 3–12)
Neutro Abs: 4.4 10*3/uL (ref 1.7–7.7)
Neutrophils Relative %: 63 % (ref 43–77)
Platelets: 206 10*3/uL (ref 150–400)
RBC: 4.74 MIL/uL (ref 3.87–5.11)
RDW: 13.3 % (ref 11.5–15.5)
WBC: 7 10*3/uL (ref 4.0–10.5)

## 2012-10-23 LAB — LACTIC ACID, PLASMA: Lactic Acid, Venous: 1 mmol/L (ref 0.5–2.2)

## 2012-10-23 LAB — WET PREP, GENITAL
Trich, Wet Prep: NONE SEEN
Yeast Wet Prep HPF POC: NONE SEEN

## 2012-10-23 LAB — LIPASE, BLOOD: Lipase: 40 U/L (ref 11–59)

## 2012-10-23 LAB — PREGNANCY, URINE: Preg Test, Ur: NEGATIVE

## 2012-10-23 MED ORDER — ONDANSETRON HCL 4 MG/2ML IJ SOLN
4.0000 mg | Freq: Once | INTRAMUSCULAR | Status: AC
  Administered 2012-10-23: 4 mg via INTRAVENOUS
  Filled 2012-10-23: qty 2

## 2012-10-23 MED ORDER — SODIUM CHLORIDE 0.9 % IV BOLUS (SEPSIS)
1000.0000 mL | Freq: Once | INTRAVENOUS | Status: AC
  Administered 2012-10-23: 1000 mL via INTRAVENOUS

## 2012-10-23 MED ORDER — CLINDAMYCIN HCL 150 MG PO CAPS: 300.0000 mg | ORAL_CAPSULE | Freq: Two times a day (BID) | ORAL | Status: DC

## 2012-10-23 MED ORDER — IOHEXOL 300 MG/ML  SOLN
50.0000 mL | Freq: Once | INTRAMUSCULAR | Status: AC | PRN
  Administered 2012-10-23: 50 mL via ORAL

## 2012-10-23 MED ORDER — CLINDAMYCIN PHOSPHATE 2 % VA CREA: 1.0000 | TOPICAL_CREAM | Freq: Every day | VAGINAL | Status: DC

## 2012-10-23 MED ORDER — IOHEXOL 300 MG/ML  SOLN
100.0000 mL | Freq: Once | INTRAMUSCULAR | Status: AC | PRN
  Administered 2012-10-23: 100 mL via INTRAVENOUS

## 2012-10-23 MED ORDER — METRONIDAZOLE 500 MG PO TABS
2000.0000 mg | ORAL_TABLET | Freq: Once | ORAL | Status: DC
  Filled 2012-10-23: qty 4

## 2012-10-23 MED ORDER — ONDANSETRON HCL 4 MG PO TABS: 4.0000 mg | ORAL_TABLET | Freq: Four times a day (QID) | ORAL | Status: DC

## 2012-10-23 NOTE — ED Notes (Signed)
Patient states that her abd hurts. Tearful in triage. Nausea for about a week.

## 2012-10-23 NOTE — ED Notes (Signed)
Patient transported to CT 

## 2012-10-23 NOTE — ED Notes (Signed)
Pt returned from CT °

## 2012-10-23 NOTE — ED Notes (Signed)
Pt crying and stating she cannot drink the contrast.  She states he stomach feels tight and "I'm not drinking it all".  Discussed importance of PO contrast and encouraged pt to drink.
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
## 2012-10-23 NOTE — ED Notes (Signed)
Pt states that she can't take flagyl due to past reactions. MD made aware.

## 2012-10-24 DIAGNOSIS — F05 Delirium due to known physiological condition: Secondary | ICD-10-CM | POA: Diagnosis not present

## 2012-10-24 DIAGNOSIS — R410 Disorientation, unspecified: Secondary | ICD-10-CM | POA: Diagnosis not present

## 2012-10-24 DIAGNOSIS — F451 Undifferentiated somatoform disorder: Secondary | ICD-10-CM | POA: Diagnosis not present

## 2012-10-24 DIAGNOSIS — F4489 Other dissociative and conversion disorders: Secondary | ICD-10-CM | POA: Diagnosis not present

## 2012-10-24 DIAGNOSIS — Z79899 Other long term (current) drug therapy: Secondary | ICD-10-CM | POA: Diagnosis not present

## 2012-10-24 DIAGNOSIS — I10 Essential (primary) hypertension: Secondary | ICD-10-CM | POA: Diagnosis not present

## 2012-10-24 DIAGNOSIS — F29 Unspecified psychosis not due to a substance or known physiological condition: Secondary | ICD-10-CM | POA: Diagnosis not present

## 2012-10-24 DIAGNOSIS — F411 Generalized anxiety disorder: Secondary | ICD-10-CM | POA: Diagnosis not present

## 2012-10-24 LAB — GC/CHLAMYDIA PROBE AMP
CT Probe RNA: NEGATIVE
GC Probe RNA: NEGATIVE

## 2012-10-30 DIAGNOSIS — F339 Major depressive disorder, recurrent, unspecified: Secondary | ICD-10-CM | POA: Diagnosis not present

## 2012-10-31 DIAGNOSIS — D649 Anemia, unspecified: Secondary | ICD-10-CM | POA: Diagnosis not present

## 2012-10-31 LAB — VITAMIN D 1,25 DIHYDROXY
Ferritin: 102.6 ng/mL (ref 9.0–150.0)
Iron: 108
Vit D, 25-Hydroxy: 16.7

## 2012-11-03 DIAGNOSIS — M79609 Pain in unspecified limb: Secondary | ICD-10-CM | POA: Diagnosis not present

## 2012-11-03 DIAGNOSIS — J019 Acute sinusitis, unspecified: Secondary | ICD-10-CM | POA: Diagnosis not present

## 2012-11-03 DIAGNOSIS — S6000XA Contusion of unspecified finger without damage to nail, initial encounter: Secondary | ICD-10-CM | POA: Diagnosis not present

## 2012-11-03 DIAGNOSIS — F911 Conduct disorder, childhood-onset type: Secondary | ICD-10-CM | POA: Diagnosis not present

## 2012-11-03 DIAGNOSIS — IMO0002 Reserved for concepts with insufficient information to code with codable children: Secondary | ICD-10-CM | POA: Diagnosis not present

## 2012-11-03 DIAGNOSIS — J3489 Other specified disorders of nose and nasal sinuses: Secondary | ICD-10-CM | POA: Diagnosis not present

## 2012-11-03 DIAGNOSIS — Z79899 Other long term (current) drug therapy: Secondary | ICD-10-CM | POA: Diagnosis not present

## 2012-11-04 ENCOUNTER — Encounter (HOSPITAL_BASED_OUTPATIENT_CLINIC_OR_DEPARTMENT_OTHER): Payer: Self-pay

## 2012-11-04 ENCOUNTER — Emergency Department (HOSPITAL_BASED_OUTPATIENT_CLINIC_OR_DEPARTMENT_OTHER)
Admission: EM | Admit: 2012-11-04 | Discharge: 2012-11-04 | Disposition: A | Discharge status: Home / Self Care | Payer: Medicare Other | Attending: Emergency Medicine | Admitting: Emergency Medicine

## 2012-11-04 DIAGNOSIS — G40909 Epilepsy, unspecified, not intractable, without status epilepticus: Secondary | ICD-10-CM | POA: Diagnosis not present

## 2012-11-04 DIAGNOSIS — I10 Essential (primary) hypertension: Secondary | ICD-10-CM | POA: Diagnosis not present

## 2012-11-04 DIAGNOSIS — F4310 Post-traumatic stress disorder, unspecified: Secondary | ICD-10-CM | POA: Insufficient documentation

## 2012-11-04 DIAGNOSIS — J45909 Unspecified asthma, uncomplicated: Secondary | ICD-10-CM | POA: Insufficient documentation

## 2012-11-04 DIAGNOSIS — G4730 Sleep apnea, unspecified: Secondary | ICD-10-CM | POA: Diagnosis not present

## 2012-11-04 DIAGNOSIS — Z8679 Personal history of other diseases of the circulatory system: Secondary | ICD-10-CM | POA: Insufficient documentation

## 2012-11-04 DIAGNOSIS — J329 Chronic sinusitis, unspecified: Secondary | ICD-10-CM | POA: Diagnosis not present

## 2012-11-04 DIAGNOSIS — Z862 Personal history of diseases of the blood and blood-forming organs and certain disorders involving the immune mechanism: Secondary | ICD-10-CM | POA: Diagnosis not present

## 2012-11-04 DIAGNOSIS — Z8709 Personal history of other diseases of the respiratory system: Secondary | ICD-10-CM | POA: Diagnosis not present

## 2012-11-04 DIAGNOSIS — Z87891 Personal history of nicotine dependence: Secondary | ICD-10-CM | POA: Diagnosis not present

## 2012-11-04 DIAGNOSIS — Z79899 Other long term (current) drug therapy: Secondary | ICD-10-CM | POA: Insufficient documentation

## 2012-11-04 DIAGNOSIS — J019 Acute sinusitis, unspecified: Secondary | ICD-10-CM | POA: Diagnosis not present

## 2012-11-04 DIAGNOSIS — E785 Hyperlipidemia, unspecified: Secondary | ICD-10-CM | POA: Insufficient documentation

## 2012-11-04 DIAGNOSIS — J3489 Other specified disorders of nose and nasal sinuses: Secondary | ICD-10-CM | POA: Diagnosis not present

## 2012-11-04 DIAGNOSIS — Z8742 Personal history of other diseases of the female genital tract: Secondary | ICD-10-CM | POA: Insufficient documentation

## 2012-11-04 DIAGNOSIS — M79609 Pain in unspecified limb: Secondary | ICD-10-CM | POA: Diagnosis not present

## 2012-11-04 DIAGNOSIS — Z8639 Personal history of other endocrine, nutritional and metabolic disease: Secondary | ICD-10-CM | POA: Insufficient documentation

## 2012-11-04 MED ORDER — LEVOFLOXACIN 500 MG PO TABS: 500.0000 mg | ORAL_TABLET | Freq: Every day | ORAL | Status: DC

## 2012-11-04 NOTE — ED Notes (Signed)
MD at bedside. 

## 2012-11-04 NOTE — ED Notes (Signed)
Pt. Has multiple allergies.  EDP explaines to Pt. She needs to see PMD and will retreat Pt. With abx she has just finished.

## 2012-11-04 NOTE — ED Notes (Signed)
Pt states that she has pain in her R axilla, and she also has submandibular lymph node swelling per pt.  Pt states that she is very tender and pain.  Been treated for sinus infection and has improved.  Pt states that she has tried multiple times to contact PCP and hasn't able to get an appt.
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
## 2012-11-05 ENCOUNTER — Encounter (HOSPITAL_BASED_OUTPATIENT_CLINIC_OR_DEPARTMENT_OTHER): Payer: Self-pay | Admitting: Emergency Medicine

## 2012-11-05 ENCOUNTER — Emergency Department (HOSPITAL_BASED_OUTPATIENT_CLINIC_OR_DEPARTMENT_OTHER)
Admission: EM | Admit: 2012-11-05 | Discharge: 2012-11-05 | Disposition: A | Discharge status: Home / Self Care | Payer: Medicare Other | Attending: Emergency Medicine | Admitting: Emergency Medicine

## 2012-11-05 DIAGNOSIS — Z8739 Personal history of other diseases of the musculoskeletal system and connective tissue: Secondary | ICD-10-CM | POA: Insufficient documentation

## 2012-11-05 DIAGNOSIS — K5900 Constipation, unspecified: Secondary | ICD-10-CM | POA: Diagnosis not present

## 2012-11-05 DIAGNOSIS — N938 Other specified abnormal uterine and vaginal bleeding: Secondary | ICD-10-CM | POA: Insufficient documentation

## 2012-11-05 DIAGNOSIS — Z85828 Personal history of other malignant neoplasm of skin: Secondary | ICD-10-CM | POA: Insufficient documentation

## 2012-11-05 DIAGNOSIS — R112 Nausea with vomiting, unspecified: Secondary | ICD-10-CM | POA: Insufficient documentation

## 2012-11-05 DIAGNOSIS — Z8619 Personal history of other infectious and parasitic diseases: Secondary | ICD-10-CM | POA: Insufficient documentation

## 2012-11-05 DIAGNOSIS — Z8669 Personal history of other diseases of the nervous system and sense organs: Secondary | ICD-10-CM | POA: Diagnosis not present

## 2012-11-05 DIAGNOSIS — J45909 Unspecified asthma, uncomplicated: Secondary | ICD-10-CM | POA: Diagnosis not present

## 2012-11-05 DIAGNOSIS — K219 Gastro-esophageal reflux disease without esophagitis: Secondary | ICD-10-CM | POA: Diagnosis not present

## 2012-11-05 DIAGNOSIS — Z8639 Personal history of other endocrine, nutritional and metabolic disease: Secondary | ICD-10-CM | POA: Insufficient documentation

## 2012-11-05 DIAGNOSIS — Z8659 Personal history of other mental and behavioral disorders: Secondary | ICD-10-CM | POA: Insufficient documentation

## 2012-11-05 DIAGNOSIS — Z8709 Personal history of other diseases of the respiratory system: Secondary | ICD-10-CM | POA: Insufficient documentation

## 2012-11-05 DIAGNOSIS — N925 Other specified irregular menstruation: Secondary | ICD-10-CM | POA: Insufficient documentation

## 2012-11-05 DIAGNOSIS — N949 Unspecified condition associated with female genital organs and menstrual cycle: Secondary | ICD-10-CM | POA: Insufficient documentation

## 2012-11-05 DIAGNOSIS — Z9861 Coronary angioplasty status: Secondary | ICD-10-CM | POA: Diagnosis not present

## 2012-11-05 DIAGNOSIS — IMO0002 Reserved for concepts with insufficient information to code with codable children: Secondary | ICD-10-CM | POA: Insufficient documentation

## 2012-11-05 DIAGNOSIS — R52 Pain, unspecified: Secondary | ICD-10-CM | POA: Insufficient documentation

## 2012-11-05 DIAGNOSIS — I10 Essential (primary) hypertension: Secondary | ICD-10-CM | POA: Diagnosis not present

## 2012-11-05 DIAGNOSIS — Z9851 Tubal ligation status: Secondary | ICD-10-CM | POA: Diagnosis not present

## 2012-11-05 DIAGNOSIS — Z79899 Other long term (current) drug therapy: Secondary | ICD-10-CM | POA: Insufficient documentation

## 2012-11-05 DIAGNOSIS — R109 Unspecified abdominal pain: Secondary | ICD-10-CM | POA: Insufficient documentation

## 2012-11-05 DIAGNOSIS — Z9089 Acquired absence of other organs: Secondary | ICD-10-CM | POA: Diagnosis not present

## 2012-11-05 DIAGNOSIS — Z87891 Personal history of nicotine dependence: Secondary | ICD-10-CM | POA: Insufficient documentation

## 2012-11-05 DIAGNOSIS — Z792 Long term (current) use of antibiotics: Secondary | ICD-10-CM | POA: Insufficient documentation

## 2012-11-05 DIAGNOSIS — Z862 Personal history of diseases of the blood and blood-forming organs and certain disorders involving the immune mechanism: Secondary | ICD-10-CM | POA: Diagnosis not present

## 2012-11-05 DIAGNOSIS — F339 Major depressive disorder, recurrent, unspecified: Secondary | ICD-10-CM | POA: Diagnosis not present

## 2012-11-05 LAB — URINALYSIS, ROUTINE W REFLEX MICROSCOPIC
Bilirubin Urine: NEGATIVE
Glucose, UA: NEGATIVE mg/dL
Ketones, ur: NEGATIVE mg/dL
Leukocytes, UA: NEGATIVE
Nitrite: NEGATIVE
Protein, ur: NEGATIVE mg/dL
Specific Gravity, Urine: 1.02 (ref 1.005–1.030)
Urobilinogen, UA: 1 mg/dL (ref 0.0–1.0)
pH: 7 (ref 5.0–8.0)

## 2012-11-05 LAB — URINE MICROSCOPIC-ADD ON

## 2012-11-05 LAB — WET PREP, GENITAL
Clue Cells Wet Prep HPF POC: NONE SEEN
Trich, Wet Prep: NONE SEEN
Yeast Wet Prep HPF POC: NONE SEEN

## 2012-11-05 MED ORDER — HYDROCODONE-ACETAMINOPHEN 5-325 MG PO TABS: 1.0000 | ORAL_TABLET | ORAL | Status: DC | PRN

## 2012-11-05 MED ORDER — IBUPROFEN 100 MG/5ML PO SUSP
200.0000 mg | Freq: Once | ORAL | Status: AC
  Administered 2012-11-05: 200 mg via ORAL
  Filled 2012-11-05: qty 10
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
## 2012-11-05 NOTE — ED Notes (Signed)
Pt also states that she is having post-nasal drainage and cough for few days. Pt was started on levaquin 500mg  yesterday but is only able to take 250 mg daily.

## 2012-11-05 NOTE — ED Notes (Signed)
Pt c/o suprapubic pain and generalized pain. Pt was seen here yesterday and was started levoquin 500 mg here yesterday and pt states she can't take the high dosage.

## 2012-11-05 NOTE — ED Notes (Signed)
MD at bedside. 

## 2012-11-05 NOTE — ED Notes (Signed)
Pt just started taking norvas today.

## 2012-11-06 DIAGNOSIS — J019 Acute sinusitis, unspecified: Secondary | ICD-10-CM | POA: Diagnosis not present

## 2012-11-06 DIAGNOSIS — I10 Essential (primary) hypertension: Secondary | ICD-10-CM | POA: Diagnosis not present

## 2012-11-06 DIAGNOSIS — R1310 Dysphagia, unspecified: Secondary | ICD-10-CM | POA: Diagnosis not present

## 2012-11-06 LAB — GC/CHLAMYDIA PROBE AMP
CT Probe RNA: NEGATIVE
GC Probe RNA: NEGATIVE

## 2012-11-07 ENCOUNTER — Ambulatory Visit: Payer: Medicare Other | Admitting: Family Medicine

## 2012-11-08 DIAGNOSIS — I10 Essential (primary) hypertension: Secondary | ICD-10-CM | POA: Diagnosis not present

## 2012-11-08 DIAGNOSIS — E269 Hyperaldosteronism, unspecified: Secondary | ICD-10-CM | POA: Diagnosis not present

## 2012-11-08 DIAGNOSIS — Z79899 Other long term (current) drug therapy: Secondary | ICD-10-CM | POA: Diagnosis not present

## 2012-11-08 DIAGNOSIS — Z8673 Personal history of transient ischemic attack (TIA), and cerebral infarction without residual deficits: Secondary | ICD-10-CM | POA: Diagnosis not present

## 2012-11-08 DIAGNOSIS — R0789 Other chest pain: Secondary | ICD-10-CM | POA: Diagnosis not present

## 2012-11-08 DIAGNOSIS — R079 Chest pain, unspecified: Secondary | ICD-10-CM | POA: Diagnosis not present

## 2012-11-08 IMAGING — US US PELVIS COMPLETE
1 series · 14 of 25 positions shown · non-contrast
Comparison: CT dated 01/25/2010.

CLINICAL DATA: Lower abdominal pain.  History of uterine fibroids
and tubal ligation.

TRANSABDOMINAL AND TRANSVAGINAL ULTRASOUND OF PELVIS
TECHNIQUE: Both transabdominal and transvaginal ultrasound
examinations of the pelvis were performed. Transabdominal technique
was performed for global imaging of the pelvis including uterus,
ovaries, adnexal regions, and pelvic cul-de-sac.

[Series 1: us pelvis complete · 0.28mm/px · 14 of 60 slices shown]
[im 1/60]
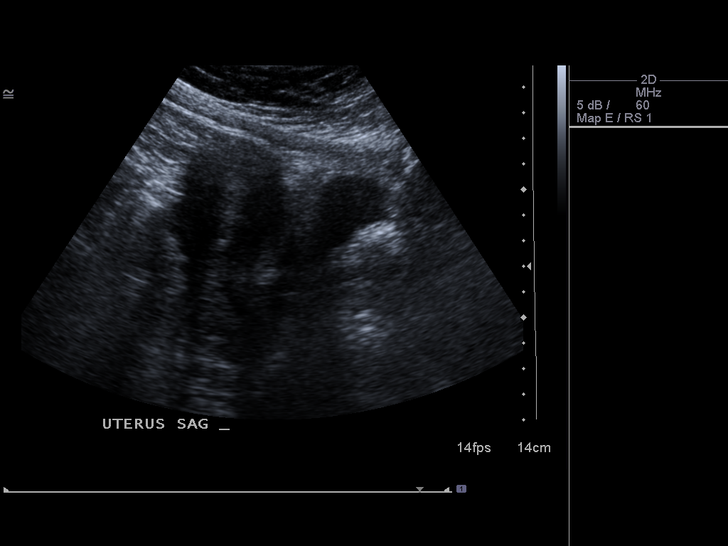
[im 5/60]
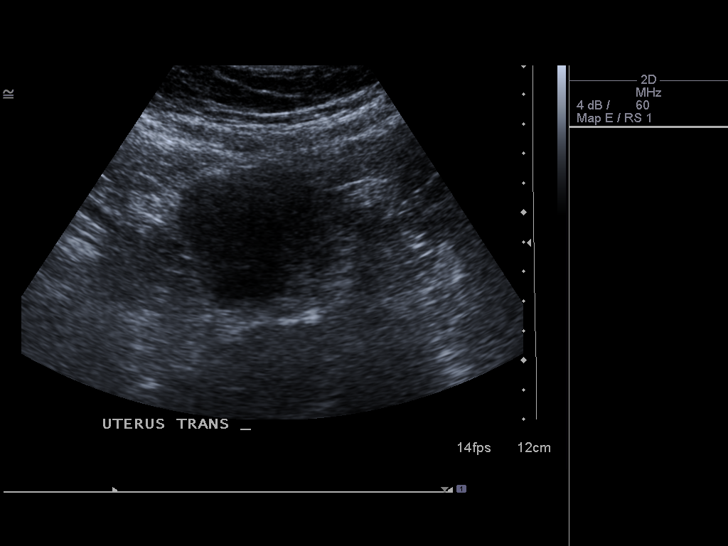
[im 10/60]
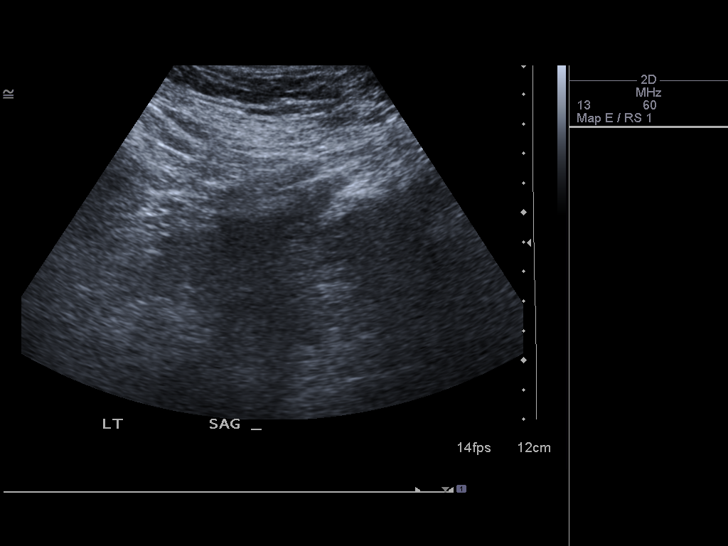
[im 15/60]
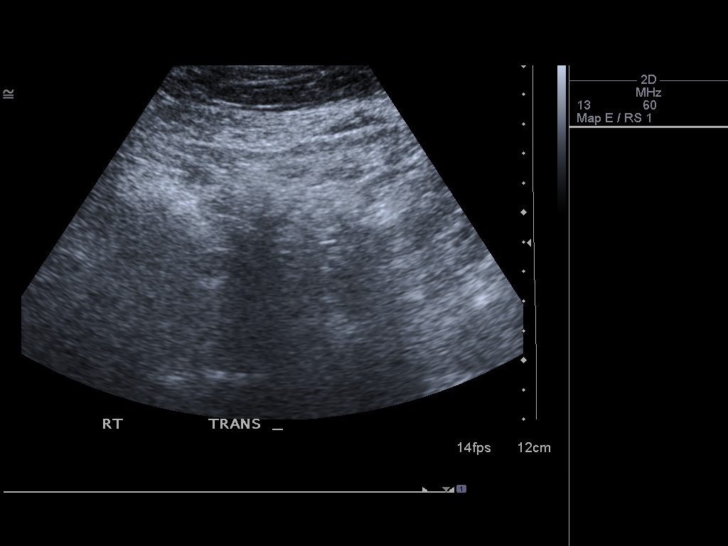
[im 20/60]
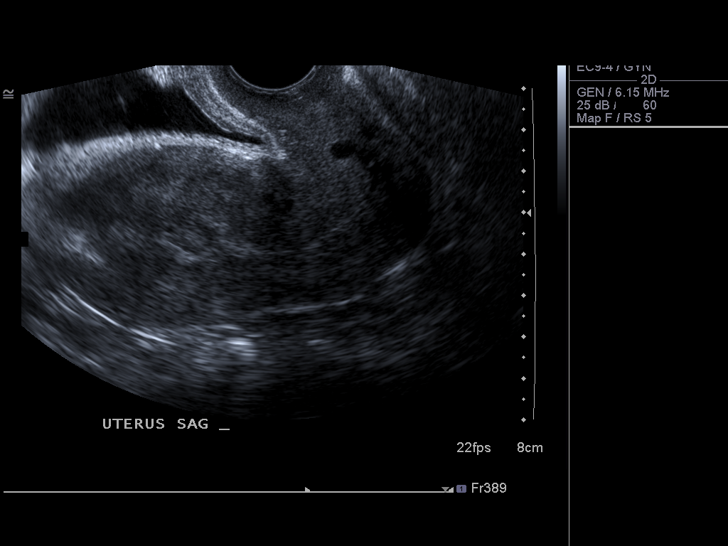
[im 23/60]
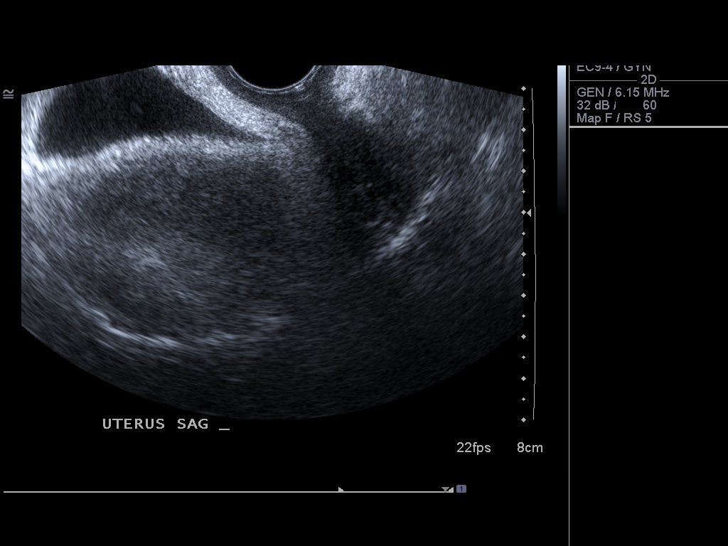
[im 28/60]
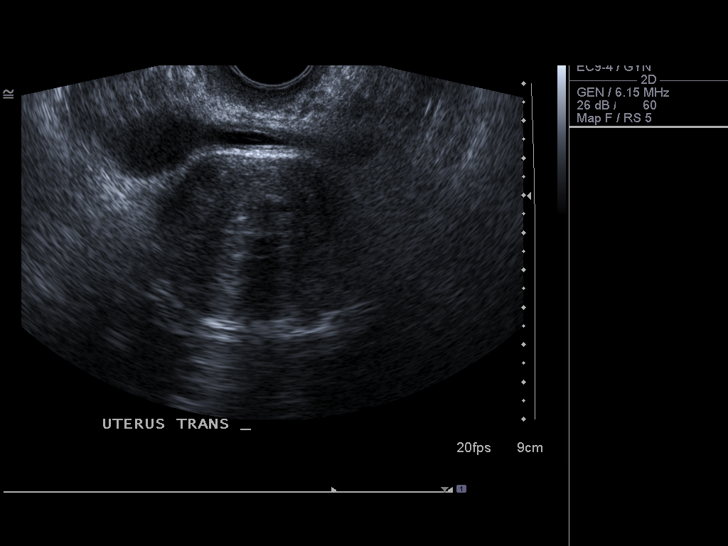
[im 32/60]
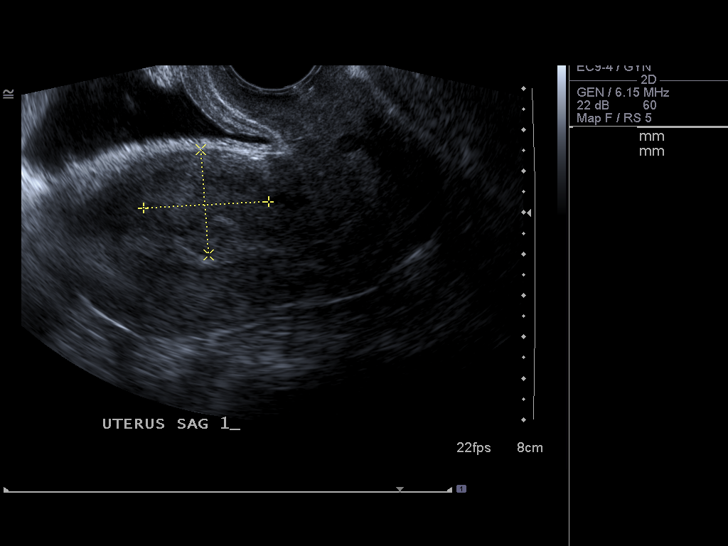
[im 37/60]
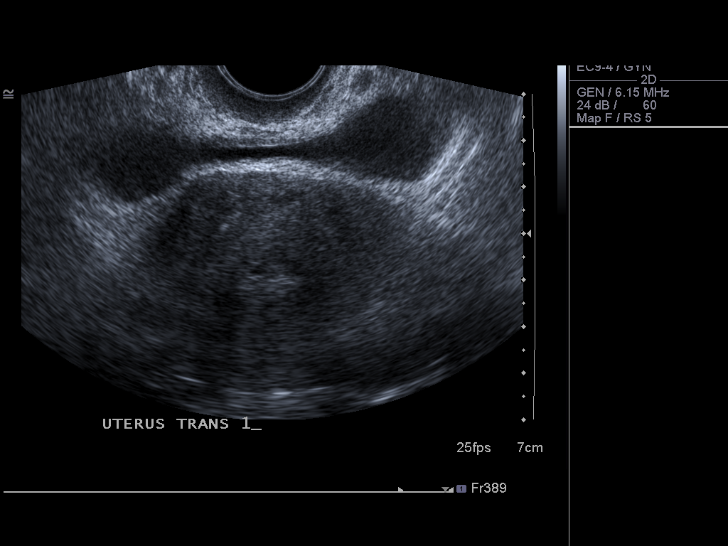
[im 40/60]
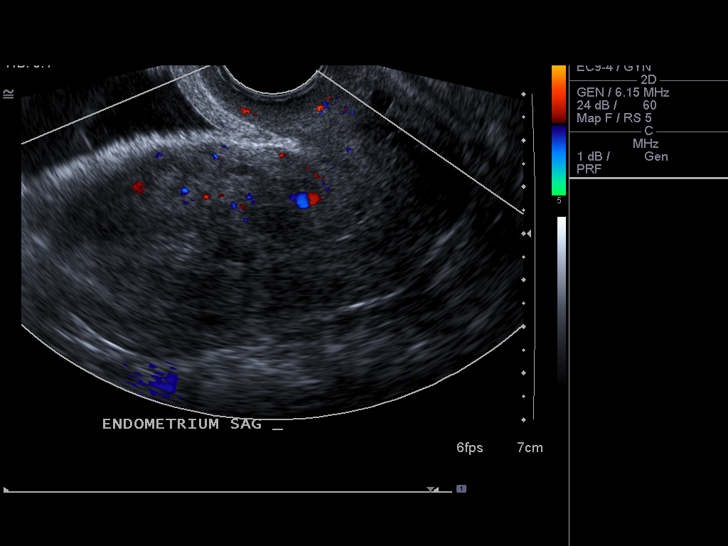
[im 45/60]
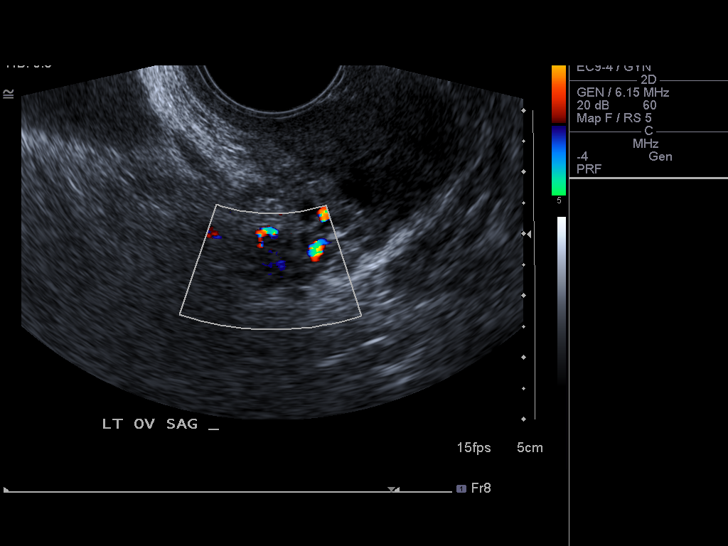
[im 50/60]
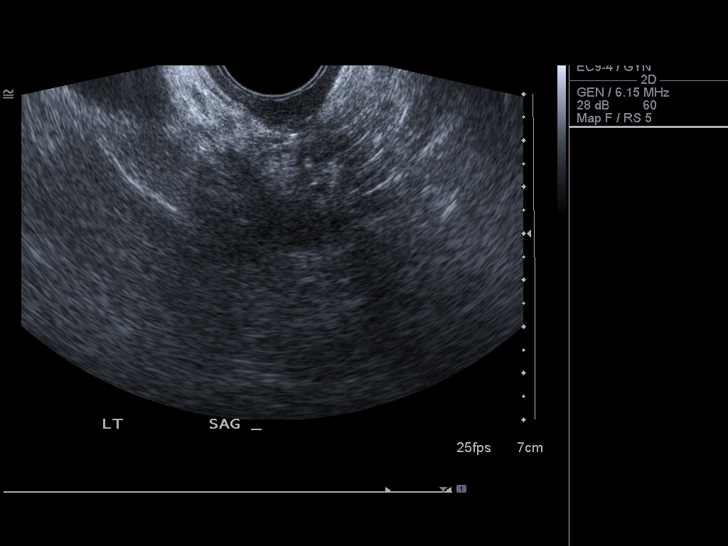
[im 55/60]
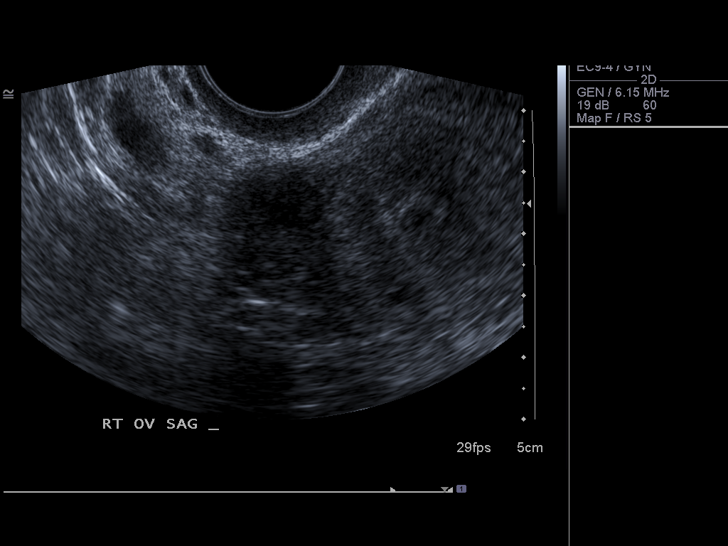
[im 60/60]
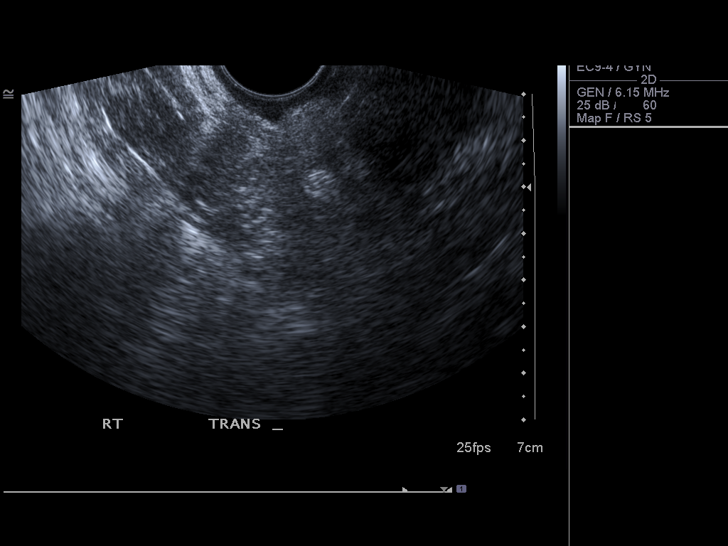

[14 of 25 positions shown; findings below may reference images not displayed]

It was necessary to proceed with endovaginal exam following the
transabdominal exam to visualize the uterus and ovaries in better
detail..
FINDINGS: Uterus: Poorly defined, oval, heterogeneous mass in the anterior
aspect of the mid uterus, with increased internal blood flow with
color Doppler.  This measures approximately 3.3 x 3.0 x 2.6 cm in
maximum dimensions.  This is most likely abutting the endometrial
stripe without deformity of the stripe.

Endometrium: Normal in thickness and appearance

Right ovary:  Normal appearance/no adnexal mass

Left ovary: Normal appearance/no adnexal mass

Other findings: No free fluid
IMPRESSION: 3.3 cm uterine fibroid.  Otherwise, unremarkable examination..

## 2012-11-11 ENCOUNTER — Encounter: Payer: Self-pay | Admitting: Family Medicine

## 2012-11-11 ENCOUNTER — Ambulatory Visit (INDEPENDENT_AMBULATORY_CARE_PROVIDER_SITE_OTHER): Payer: Medicare Other | Admitting: Family Medicine

## 2012-11-11 VITALS — BP 137/92 | HR 101 | Wt 183.0 lb

## 2012-11-11 DIAGNOSIS — F430 Acute stress reaction: Secondary | ICD-10-CM

## 2012-11-11 DIAGNOSIS — J029 Acute pharyngitis, unspecified: Secondary | ICD-10-CM

## 2012-11-11 DIAGNOSIS — I10 Essential (primary) hypertension: Secondary | ICD-10-CM

## 2012-11-11 DIAGNOSIS — IMO0001 Reserved for inherently not codable concepts without codable children: Secondary | ICD-10-CM

## 2012-11-11 DIAGNOSIS — R0789 Other chest pain: Secondary | ICD-10-CM | POA: Diagnosis not present

## 2012-11-11 DIAGNOSIS — M797 Fibromyalgia: Secondary | ICD-10-CM
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
## 2012-11-12 DIAGNOSIS — F339 Major depressive disorder, recurrent, unspecified: Secondary | ICD-10-CM | POA: Diagnosis not present

## 2012-11-13 DIAGNOSIS — Z79899 Other long term (current) drug therapy: Secondary | ICD-10-CM | POA: Diagnosis not present

## 2012-11-13 DIAGNOSIS — Z881 Allergy status to other antibiotic agents status: Secondary | ICD-10-CM | POA: Diagnosis not present

## 2012-11-13 DIAGNOSIS — R1310 Dysphagia, unspecified: Secondary | ICD-10-CM | POA: Diagnosis not present

## 2012-11-13 DIAGNOSIS — I10 Essential (primary) hypertension: Secondary | ICD-10-CM | POA: Diagnosis not present

## 2012-11-13 DIAGNOSIS — Z9861 Coronary angioplasty status: Secondary | ICD-10-CM | POA: Diagnosis not present

## 2012-11-13 DIAGNOSIS — Z886 Allergy status to analgesic agent status: Secondary | ICD-10-CM | POA: Diagnosis not present

## 2012-11-13 DIAGNOSIS — R0602 Shortness of breath: Secondary | ICD-10-CM | POA: Diagnosis not present

## 2012-11-13 DIAGNOSIS — J384 Edema of larynx: Secondary | ICD-10-CM | POA: Diagnosis not present

## 2012-11-13 DIAGNOSIS — J45909 Unspecified asthma, uncomplicated: Secondary | ICD-10-CM | POA: Diagnosis not present

## 2012-11-13 DIAGNOSIS — M889 Osteitis deformans of unspecified bone: Secondary | ICD-10-CM | POA: Diagnosis not present

## 2012-11-13 DIAGNOSIS — Z888 Allergy status to other drugs, medicaments and biological substances status: Secondary | ICD-10-CM | POA: Diagnosis not present

## 2012-11-14 ENCOUNTER — Other Ambulatory Visit: Payer: Self-pay | Admitting: Family Medicine

## 2012-11-14 DIAGNOSIS — E876 Hypokalemia: Secondary | ICD-10-CM | POA: Diagnosis not present

## 2012-11-14 DIAGNOSIS — Z882 Allergy status to sulfonamides status: Secondary | ICD-10-CM | POA: Diagnosis not present

## 2012-11-14 DIAGNOSIS — J45909 Unspecified asthma, uncomplicated: Secondary | ICD-10-CM | POA: Diagnosis not present

## 2012-11-14 DIAGNOSIS — K219 Gastro-esophageal reflux disease without esophagitis: Secondary | ICD-10-CM | POA: Diagnosis not present

## 2012-11-14 DIAGNOSIS — Z880 Allergy status to penicillin: Secondary | ICD-10-CM | POA: Diagnosis not present

## 2012-11-14 DIAGNOSIS — F339 Major depressive disorder, recurrent, unspecified: Secondary | ICD-10-CM | POA: Diagnosis not present

## 2012-11-14 DIAGNOSIS — E269 Hyperaldosteronism, unspecified: Secondary | ICD-10-CM | POA: Diagnosis not present

## 2012-11-14 DIAGNOSIS — Z884 Allergy status to anesthetic agent status: Secondary | ICD-10-CM | POA: Diagnosis not present

## 2012-11-14 DIAGNOSIS — E7800 Pure hypercholesterolemia, unspecified: Secondary | ICD-10-CM | POA: Diagnosis not present

## 2012-11-14 DIAGNOSIS — Z888 Allergy status to other drugs, medicaments and biological substances status: Secondary | ICD-10-CM | POA: Diagnosis not present

## 2012-11-14 DIAGNOSIS — R079 Chest pain, unspecified: Secondary | ICD-10-CM | POA: Diagnosis not present

## 2012-11-14 DIAGNOSIS — E559 Vitamin D deficiency, unspecified: Secondary | ICD-10-CM | POA: Diagnosis not present

## 2012-11-14 DIAGNOSIS — R5381 Other malaise: Secondary | ICD-10-CM | POA: Diagnosis not present

## 2012-11-14 DIAGNOSIS — M889 Osteitis deformans of unspecified bone: Secondary | ICD-10-CM | POA: Diagnosis not present

## 2012-11-14 DIAGNOSIS — I10 Essential (primary) hypertension: Secondary | ICD-10-CM | POA: Diagnosis not present

## 2012-11-14 DIAGNOSIS — Z881 Allergy status to other antibiotic agents status: Secondary | ICD-10-CM | POA: Diagnosis not present

## 2012-11-14 DIAGNOSIS — Z79899 Other long term (current) drug therapy: Secondary | ICD-10-CM | POA: Diagnosis not present

## 2012-11-14 DIAGNOSIS — R7309 Other abnormal glucose: Secondary | ICD-10-CM | POA: Diagnosis not present

## 2012-11-14 LAB — HEMOGLOBIN A1C: Hgb A1c MFr Bld: 5.7 % (ref 4.0–6.0)

## 2012-11-14 LAB — BASIC METABOLIC PANEL: Glucose: 118 mg/dL

## 2012-11-14 LAB — TSH: TSH: 2.16 u[IU]/mL (ref 0.41–5.90)

## 2012-11-14 LAB — CBC AND DIFFERENTIAL: Hemoglobin: 14.1 g/dL (ref 12.0–16.0)

## 2012-11-15 DIAGNOSIS — G894 Chronic pain syndrome: Secondary | ICD-10-CM | POA: Diagnosis not present

## 2012-11-15 DIAGNOSIS — R1011 Right upper quadrant pain: Secondary | ICD-10-CM | POA: Diagnosis not present

## 2012-11-15 DIAGNOSIS — E876 Hypokalemia: Secondary | ICD-10-CM | POA: Diagnosis not present

## 2012-11-15 DIAGNOSIS — R109 Unspecified abdominal pain: Secondary | ICD-10-CM | POA: Diagnosis not present

## 2012-11-15 DIAGNOSIS — R110 Nausea: Secondary | ICD-10-CM | POA: Diagnosis not present

## 2012-11-15 DIAGNOSIS — R079 Chest pain, unspecified: Secondary | ICD-10-CM | POA: Diagnosis not present

## 2012-11-15 DIAGNOSIS — Z881 Allergy status to other antibiotic agents status: Secondary | ICD-10-CM | POA: Diagnosis not present

## 2012-11-15 DIAGNOSIS — Z8673 Personal history of transient ischemic attack (TIA), and cerebral infarction without residual deficits: Secondary | ICD-10-CM | POA: Diagnosis not present

## 2012-11-15 DIAGNOSIS — Z79899 Other long term (current) drug therapy: Secondary | ICD-10-CM | POA: Diagnosis not present

## 2012-11-15 DIAGNOSIS — Z882 Allergy status to sulfonamides status: Secondary | ICD-10-CM | POA: Diagnosis not present

## 2012-11-15 DIAGNOSIS — Z880 Allergy status to penicillin: Secondary | ICD-10-CM | POA: Diagnosis not present

## 2012-11-15 DIAGNOSIS — M889 Osteitis deformans of unspecified bone: Secondary | ICD-10-CM | POA: Diagnosis not present

## 2012-11-15 DIAGNOSIS — Z888 Allergy status to other drugs, medicaments and biological substances status: Secondary | ICD-10-CM | POA: Diagnosis not present

## 2012-11-15 DIAGNOSIS — Z886 Allergy status to analgesic agent status: Secondary | ICD-10-CM | POA: Diagnosis not present

## 2012-11-15 DIAGNOSIS — I10 Essential (primary) hypertension: Secondary | ICD-10-CM | POA: Diagnosis not present

## 2012-11-18 DIAGNOSIS — K6289 Other specified diseases of anus and rectum: Secondary | ICD-10-CM | POA: Diagnosis not present

## 2012-11-18 DIAGNOSIS — R110 Nausea: Secondary | ICD-10-CM | POA: Diagnosis not present

## 2012-11-18 DIAGNOSIS — D259 Leiomyoma of uterus, unspecified: Secondary | ICD-10-CM | POA: Diagnosis not present

## 2012-11-18 DIAGNOSIS — R1084 Generalized abdominal pain: Secondary | ICD-10-CM | POA: Diagnosis not present

## 2012-11-18 DIAGNOSIS — K5900 Constipation, unspecified: Secondary | ICD-10-CM | POA: Diagnosis not present

## 2012-11-18 DIAGNOSIS — R197 Diarrhea, unspecified: Secondary | ICD-10-CM | POA: Diagnosis not present

## 2012-11-18 DIAGNOSIS — I10 Essential (primary) hypertension: Secondary | ICD-10-CM | POA: Diagnosis not present

## 2012-11-18 DIAGNOSIS — R109 Unspecified abdominal pain: Secondary | ICD-10-CM | POA: Diagnosis not present

## 2012-11-18 DIAGNOSIS — Z79899 Other long term (current) drug therapy: Secondary | ICD-10-CM | POA: Diagnosis not present

## 2012-11-19 DIAGNOSIS — Z880 Allergy status to penicillin: Secondary | ICD-10-CM | POA: Diagnosis not present

## 2012-11-19 DIAGNOSIS — Z882 Allergy status to sulfonamides status: Secondary | ICD-10-CM | POA: Diagnosis not present

## 2012-11-19 DIAGNOSIS — Z888 Allergy status to other drugs, medicaments and biological substances status: Secondary | ICD-10-CM | POA: Diagnosis not present

## 2012-11-19 DIAGNOSIS — Z886 Allergy status to analgesic agent status: Secondary | ICD-10-CM | POA: Diagnosis not present

## 2012-11-19 DIAGNOSIS — Z881 Allergy status to other antibiotic agents status: Secondary | ICD-10-CM | POA: Diagnosis not present

## 2012-11-19 DIAGNOSIS — R1084 Generalized abdominal pain: Secondary | ICD-10-CM | POA: Diagnosis not present

## 2012-11-19 DIAGNOSIS — I10 Essential (primary) hypertension: Secondary | ICD-10-CM | POA: Diagnosis not present

## 2012-11-19 DIAGNOSIS — Z9109 Other allergy status, other than to drugs and biological substances: Secondary | ICD-10-CM | POA: Diagnosis not present

## 2012-11-19 DIAGNOSIS — R110 Nausea: Secondary | ICD-10-CM | POA: Diagnosis not present

## 2012-11-19 DIAGNOSIS — D259 Leiomyoma of uterus, unspecified: Secondary | ICD-10-CM | POA: Diagnosis not present

## 2012-11-19 DIAGNOSIS — R197 Diarrhea, unspecified: Secondary | ICD-10-CM | POA: Diagnosis not present

## 2012-11-19 DIAGNOSIS — R109 Unspecified abdominal pain: Secondary | ICD-10-CM | POA: Diagnosis not present

## 2012-11-20 DIAGNOSIS — E876 Hypokalemia: Secondary | ICD-10-CM | POA: Diagnosis not present

## 2012-11-20 DIAGNOSIS — E269 Hyperaldosteronism, unspecified: Secondary | ICD-10-CM | POA: Diagnosis not present

## 2012-11-20 DIAGNOSIS — F339 Major depressive disorder, recurrent, unspecified: Secondary | ICD-10-CM | POA: Diagnosis not present

## 2012-11-20 DIAGNOSIS — I10 Essential (primary) hypertension: Secondary | ICD-10-CM | POA: Diagnosis not present

## 2012-11-20 DIAGNOSIS — I498 Other specified cardiac arrhythmias: Secondary | ICD-10-CM | POA: Diagnosis not present

## 2012-11-20 DIAGNOSIS — E7800 Pure hypercholesterolemia, unspecified: Secondary | ICD-10-CM | POA: Diagnosis not present

## 2012-11-25 DIAGNOSIS — I10 Essential (primary) hypertension: Secondary | ICD-10-CM | POA: Diagnosis not present

## 2012-11-25 DIAGNOSIS — R059 Cough, unspecified: Secondary | ICD-10-CM | POA: Diagnosis not present

## 2012-11-25 DIAGNOSIS — R1310 Dysphagia, unspecified: Secondary | ICD-10-CM | POA: Diagnosis not present

## 2012-11-25 DIAGNOSIS — K219 Gastro-esophageal reflux disease without esophagitis: Secondary | ICD-10-CM | POA: Diagnosis not present

## 2012-11-25 DIAGNOSIS — G894 Chronic pain syndrome: Secondary | ICD-10-CM | POA: Diagnosis not present

## 2012-11-25 DIAGNOSIS — Z888 Allergy status to other drugs, medicaments and biological substances status: Secondary | ICD-10-CM | POA: Diagnosis not present

## 2012-11-25 DIAGNOSIS — Z79899 Other long term (current) drug therapy: Secondary | ICD-10-CM | POA: Diagnosis not present

## 2012-11-25 DIAGNOSIS — R05 Cough: Secondary | ICD-10-CM | POA: Diagnosis not present

## 2012-11-26 DIAGNOSIS — F339 Major depressive disorder, recurrent, unspecified: Secondary | ICD-10-CM | POA: Diagnosis not present

## 2012-11-27 ENCOUNTER — Telehealth: Payer: Self-pay | Admitting: *Deleted

## 2012-11-27 NOTE — Telephone Encounter (Signed)
Pt called wanting to know if Dr. Linford Arnold had received the notes from [facility] about her Adrenal gland. We have they were scanned in on 11/25/12 Dr. Linford Arnold has read them.Loralee Pacas [place]

## 2012-11-28 ENCOUNTER — Encounter: Payer: Self-pay | Admitting: *Deleted

## 2012-11-28 DIAGNOSIS — K219 Gastro-esophageal reflux disease without esophagitis: Secondary | ICD-10-CM | POA: Diagnosis not present

## 2012-11-28 DIAGNOSIS — E663 Overweight: Secondary | ICD-10-CM | POA: Diagnosis not present

## 2012-11-28 DIAGNOSIS — R0602 Shortness of breath: Secondary | ICD-10-CM | POA: Diagnosis not present

## 2012-11-28 DIAGNOSIS — R05 Cough: Secondary | ICD-10-CM | POA: Diagnosis not present

## 2012-11-28 DIAGNOSIS — R059 Cough, unspecified: Secondary | ICD-10-CM | POA: Diagnosis not present

## 2012-11-28 DIAGNOSIS — J309 Allergic rhinitis, unspecified: Secondary | ICD-10-CM | POA: Diagnosis not present

## 2012-12-01 ENCOUNTER — Emergency Department (HOSPITAL_BASED_OUTPATIENT_CLINIC_OR_DEPARTMENT_OTHER): Payer: Medicare Other

## 2012-12-01 ENCOUNTER — Encounter (HOSPITAL_BASED_OUTPATIENT_CLINIC_OR_DEPARTMENT_OTHER): Payer: Self-pay | Admitting: Emergency Medicine

## 2012-12-01 ENCOUNTER — Emergency Department (HOSPITAL_BASED_OUTPATIENT_CLINIC_OR_DEPARTMENT_OTHER)
Admission: EM | Admit: 2012-12-01 | Discharge: 2012-12-01 | Disposition: A | Discharge status: Home / Self Care | Payer: Medicare Other | Attending: Emergency Medicine | Admitting: Emergency Medicine

## 2012-12-01 DIAGNOSIS — Z9981 Dependence on supplemental oxygen: Secondary | ICD-10-CM | POA: Insufficient documentation

## 2012-12-01 DIAGNOSIS — Z87891 Personal history of nicotine dependence: Secondary | ICD-10-CM | POA: Insufficient documentation

## 2012-12-01 DIAGNOSIS — G4730 Sleep apnea, unspecified: Secondary | ICD-10-CM | POA: Diagnosis not present

## 2012-12-01 DIAGNOSIS — N925 Other specified irregular menstruation: Secondary | ICD-10-CM | POA: Diagnosis not present

## 2012-12-01 DIAGNOSIS — R42 Dizziness and giddiness: Secondary | ICD-10-CM | POA: Diagnosis not present

## 2012-12-01 DIAGNOSIS — Z8669 Personal history of other diseases of the nervous system and sense organs: Secondary | ICD-10-CM | POA: Insufficient documentation

## 2012-12-01 DIAGNOSIS — N938 Other specified abnormal uterine and vaginal bleeding: Secondary | ICD-10-CM | POA: Diagnosis not present

## 2012-12-01 DIAGNOSIS — Z79899 Other long term (current) drug therapy: Secondary | ICD-10-CM | POA: Diagnosis not present

## 2012-12-01 DIAGNOSIS — K219 Gastro-esophageal reflux disease without esophagitis: Secondary | ICD-10-CM | POA: Diagnosis not present

## 2012-12-01 DIAGNOSIS — R071 Chest pain on breathing: Secondary | ICD-10-CM | POA: Diagnosis not present

## 2012-12-01 DIAGNOSIS — N949 Unspecified condition associated with female genital organs and menstrual cycle: Secondary | ICD-10-CM | POA: Diagnosis not present

## 2012-12-01 DIAGNOSIS — I10 Essential (primary) hypertension: Secondary | ICD-10-CM | POA: Diagnosis not present

## 2012-12-01 DIAGNOSIS — J3489 Other specified disorders of nose and nasal sinuses: Secondary | ICD-10-CM | POA: Diagnosis not present

## 2012-12-01 DIAGNOSIS — J45909 Unspecified asthma, uncomplicated: Secondary | ICD-10-CM | POA: Diagnosis not present

## 2012-12-01 DIAGNOSIS — R51 Headache: Secondary | ICD-10-CM | POA: Diagnosis not present

## 2012-12-01 DIAGNOSIS — G8929 Other chronic pain: Secondary | ICD-10-CM | POA: Insufficient documentation

## 2012-12-01 DIAGNOSIS — M542 Cervicalgia: Secondary | ICD-10-CM | POA: Diagnosis not present

## 2012-12-01 DIAGNOSIS — Z8639 Personal history of other endocrine, nutritional and metabolic disease: Secondary | ICD-10-CM | POA: Insufficient documentation

## 2012-12-01 DIAGNOSIS — G40909 Epilepsy, unspecified, not intractable, without status epilepticus: Secondary | ICD-10-CM | POA: Insufficient documentation

## 2012-12-01 DIAGNOSIS — J019 Acute sinusitis, unspecified: Secondary | ICD-10-CM | POA: Diagnosis not present

## 2012-12-01 DIAGNOSIS — Z862 Personal history of diseases of the blood and blood-forming organs and certain disorders involving the immune mechanism: Secondary | ICD-10-CM | POA: Diagnosis not present

## 2012-12-01 DIAGNOSIS — R072 Precordial pain: Secondary | ICD-10-CM | POA: Diagnosis not present

## 2012-12-01 DIAGNOSIS — R209 Unspecified disturbances of skin sensation: Secondary | ICD-10-CM | POA: Diagnosis not present

## 2012-12-01 DIAGNOSIS — H8110 Benign paroxysmal vertigo, unspecified ear: Secondary | ICD-10-CM | POA: Diagnosis not present

## 2012-12-01 DIAGNOSIS — Z8659 Personal history of other mental and behavioral disorders: Secondary | ICD-10-CM | POA: Insufficient documentation

## 2012-12-01 DIAGNOSIS — R079 Chest pain, unspecified: Secondary | ICD-10-CM | POA: Diagnosis not present

## 2012-12-01 DIAGNOSIS — L253 Unspecified contact dermatitis due to other chemical products: Secondary | ICD-10-CM | POA: Diagnosis not present

## 2012-12-01 HISTORY — DX: Vitamin D deficiency, unspecified: E55.9
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
## 2012-12-01 NOTE — ED Notes (Signed)
Pt started having chest pain, radiating to arm and back yesterday when she got out of the hospital.  Pt states she hasn't felt well recently.

## 2012-12-02 DIAGNOSIS — R42 Dizziness and giddiness: Secondary | ICD-10-CM | POA: Diagnosis not present

## 2012-12-02 DIAGNOSIS — K589 Irritable bowel syndrome without diarrhea: Secondary | ICD-10-CM | POA: Diagnosis not present

## 2012-12-02 DIAGNOSIS — K219 Gastro-esophageal reflux disease without esophagitis: Secondary | ICD-10-CM | POA: Diagnosis not present

## 2012-12-02 DIAGNOSIS — F329 Major depressive disorder, single episode, unspecified: Secondary | ICD-10-CM | POA: Diagnosis present

## 2012-12-02 DIAGNOSIS — M889 Osteitis deformans of unspecified bone: Secondary | ICD-10-CM | POA: Diagnosis present

## 2012-12-02 DIAGNOSIS — H8110 Benign paroxysmal vertigo, unspecified ear: Secondary | ICD-10-CM | POA: Diagnosis not present

## 2012-12-02 DIAGNOSIS — R638 Other symptoms and signs concerning food and fluid intake: Secondary | ICD-10-CM | POA: Diagnosis not present

## 2012-12-02 DIAGNOSIS — M542 Cervicalgia: Secondary | ICD-10-CM | POA: Diagnosis not present

## 2012-12-02 DIAGNOSIS — F411 Generalized anxiety disorder: Secondary | ICD-10-CM | POA: Diagnosis present

## 2012-12-02 DIAGNOSIS — R51 Headache: Secondary | ICD-10-CM | POA: Diagnosis not present

## 2012-12-02 DIAGNOSIS — I10 Essential (primary) hypertension: Secondary | ICD-10-CM | POA: Diagnosis not present

## 2012-12-02 DIAGNOSIS — Z79899 Other long term (current) drug therapy: Secondary | ICD-10-CM | POA: Diagnosis not present

## 2012-12-02 DIAGNOSIS — R569 Unspecified convulsions: Secondary | ICD-10-CM | POA: Diagnosis not present

## 2012-12-02 DIAGNOSIS — L253 Unspecified contact dermatitis due to other chemical products: Secondary | ICD-10-CM | POA: Diagnosis not present

## 2012-12-02 DIAGNOSIS — E269 Hyperaldosteronism, unspecified: Secondary | ICD-10-CM | POA: Diagnosis present

## 2012-12-02 DIAGNOSIS — J45909 Unspecified asthma, uncomplicated: Secondary | ICD-10-CM | POA: Diagnosis not present

## 2012-12-02 DIAGNOSIS — E2749 Other adrenocortical insufficiency: Secondary | ICD-10-CM | POA: Diagnosis present

## 2012-12-02 DIAGNOSIS — R071 Chest pain on breathing: Secondary | ICD-10-CM | POA: Diagnosis not present

## 2012-12-02 DIAGNOSIS — Z87891 Personal history of nicotine dependence: Secondary | ICD-10-CM | POA: Diagnosis not present

## 2012-12-02 DIAGNOSIS — F3289 Other specified depressive episodes: Secondary | ICD-10-CM | POA: Diagnosis present

## 2012-12-03 DIAGNOSIS — R569 Unspecified convulsions: Secondary | ICD-10-CM | POA: Diagnosis not present

## 2012-12-03 DIAGNOSIS — K589 Irritable bowel syndrome without diarrhea: Secondary | ICD-10-CM | POA: Diagnosis not present

## 2012-12-03 DIAGNOSIS — F411 Generalized anxiety disorder: Secondary | ICD-10-CM | POA: Diagnosis not present

## 2012-12-03 DIAGNOSIS — E2749 Other adrenocortical insufficiency: Secondary | ICD-10-CM | POA: Diagnosis not present

## 2012-12-03 DIAGNOSIS — J45909 Unspecified asthma, uncomplicated: Secondary | ICD-10-CM | POA: Diagnosis not present

## 2012-12-03 DIAGNOSIS — I10 Essential (primary) hypertension: Secondary | ICD-10-CM | POA: Diagnosis not present

## 2012-12-03 DIAGNOSIS — M889 Osteitis deformans of unspecified bone: Secondary | ICD-10-CM | POA: Diagnosis not present

## 2012-12-03 DIAGNOSIS — R071 Chest pain on breathing: Secondary | ICD-10-CM | POA: Diagnosis not present

## 2012-12-03 DIAGNOSIS — K219 Gastro-esophageal reflux disease without esophagitis: Secondary | ICD-10-CM | POA: Diagnosis not present

## 2012-12-03 DIAGNOSIS — R51 Headache: Secondary | ICD-10-CM | POA: Diagnosis not present

## 2012-12-03 DIAGNOSIS — L253 Unspecified contact dermatitis due to other chemical products: Secondary | ICD-10-CM | POA: Diagnosis not present

## 2012-12-04 ENCOUNTER — Encounter (HOSPITAL_BASED_OUTPATIENT_CLINIC_OR_DEPARTMENT_OTHER): Payer: Self-pay | Admitting: *Deleted

## 2012-12-04 ENCOUNTER — Emergency Department (HOSPITAL_BASED_OUTPATIENT_CLINIC_OR_DEPARTMENT_OTHER)
Admission: EM | Admit: 2012-12-04 | Discharge: 2012-12-04 | Disposition: A | Discharge status: Home / Self Care | Payer: Medicare Other | Attending: Emergency Medicine | Admitting: Emergency Medicine

## 2012-12-04 DIAGNOSIS — H6690 Otitis media, unspecified, unspecified ear: Secondary | ICD-10-CM | POA: Diagnosis not present

## 2012-12-04 DIAGNOSIS — Z862 Personal history of diseases of the blood and blood-forming organs and certain disorders involving the immune mechanism: Secondary | ICD-10-CM | POA: Diagnosis not present

## 2012-12-04 DIAGNOSIS — Z8679 Personal history of other diseases of the circulatory system: Secondary | ICD-10-CM | POA: Insufficient documentation

## 2012-12-04 DIAGNOSIS — R040 Epistaxis: Secondary | ICD-10-CM | POA: Insufficient documentation

## 2012-12-04 DIAGNOSIS — Z3202 Encounter for pregnancy test, result negative: Secondary | ICD-10-CM | POA: Diagnosis not present

## 2012-12-04 DIAGNOSIS — N938 Other specified abnormal uterine and vaginal bleeding: Secondary | ICD-10-CM | POA: Diagnosis not present

## 2012-12-04 DIAGNOSIS — I10 Essential (primary) hypertension: Secondary | ICD-10-CM | POA: Insufficient documentation

## 2012-12-04 DIAGNOSIS — F4310 Post-traumatic stress disorder, unspecified: Secondary | ICD-10-CM | POA: Insufficient documentation

## 2012-12-04 DIAGNOSIS — Z9861 Coronary angioplasty status: Secondary | ICD-10-CM | POA: Diagnosis not present

## 2012-12-04 DIAGNOSIS — Z85828 Personal history of other malignant neoplasm of skin: Secondary | ICD-10-CM | POA: Diagnosis not present

## 2012-12-04 DIAGNOSIS — Z880 Allergy status to penicillin: Secondary | ICD-10-CM | POA: Insufficient documentation

## 2012-12-04 DIAGNOSIS — N925 Other specified irregular menstruation: Secondary | ICD-10-CM | POA: Insufficient documentation

## 2012-12-04 DIAGNOSIS — J45909 Unspecified asthma, uncomplicated: Secondary | ICD-10-CM | POA: Diagnosis not present

## 2012-12-04 DIAGNOSIS — J3489 Other specified disorders of nose and nasal sinuses: Secondary | ICD-10-CM | POA: Insufficient documentation

## 2012-12-04 DIAGNOSIS — N949 Unspecified condition associated with female genital organs and menstrual cycle: Secondary | ICD-10-CM | POA: Insufficient documentation

## 2012-12-04 DIAGNOSIS — K219 Gastro-esophageal reflux disease without esophagitis: Secondary | ICD-10-CM | POA: Insufficient documentation

## 2012-12-04 DIAGNOSIS — Z79899 Other long term (current) drug therapy: Secondary | ICD-10-CM | POA: Insufficient documentation

## 2012-12-04 DIAGNOSIS — Z87891 Personal history of nicotine dependence: Secondary | ICD-10-CM | POA: Insufficient documentation

## 2012-12-04 DIAGNOSIS — Z8669 Personal history of other diseases of the nervous system and sense organs: Secondary | ICD-10-CM | POA: Diagnosis not present

## 2012-12-04 DIAGNOSIS — R42 Dizziness and giddiness: Secondary | ICD-10-CM

## 2012-12-04 DIAGNOSIS — F411 Generalized anxiety disorder: Secondary | ICD-10-CM | POA: Insufficient documentation

## 2012-12-04 DIAGNOSIS — Z8639 Personal history of other endocrine, nutritional and metabolic disease: Secondary | ICD-10-CM | POA: Insufficient documentation

## 2012-12-04 LAB — URINE MICROSCOPIC-ADD ON

## 2012-12-04 LAB — WET PREP, GENITAL
Trich, Wet Prep: NONE SEEN
Yeast Wet Prep HPF POC: NONE SEEN

## 2012-12-04 LAB — URINALYSIS, ROUTINE W REFLEX MICROSCOPIC
Bilirubin Urine: NEGATIVE
Glucose, UA: NEGATIVE mg/dL
Ketones, ur: NEGATIVE mg/dL
Nitrite: NEGATIVE
Protein, ur: NEGATIVE mg/dL
Specific Gravity, Urine: 1.015 (ref 1.005–1.030)
Urobilinogen, UA: 0.2 mg/dL (ref 0.0–1.0)
pH: 7.5 (ref 5.0–8.0)

## 2012-12-04 LAB — PREGNANCY, URINE: Preg Test, Ur: NEGATIVE

## 2012-12-04 IMAGING — CR DG CHEST 2V
2 series · 2 of 2 positions shown · non-contrast
Comparison: 07/11/2011.

CLINICAL DATA: Left lower chest pain.  Throat drainage.

CHEST - 2 VIEW

[w chest pa]
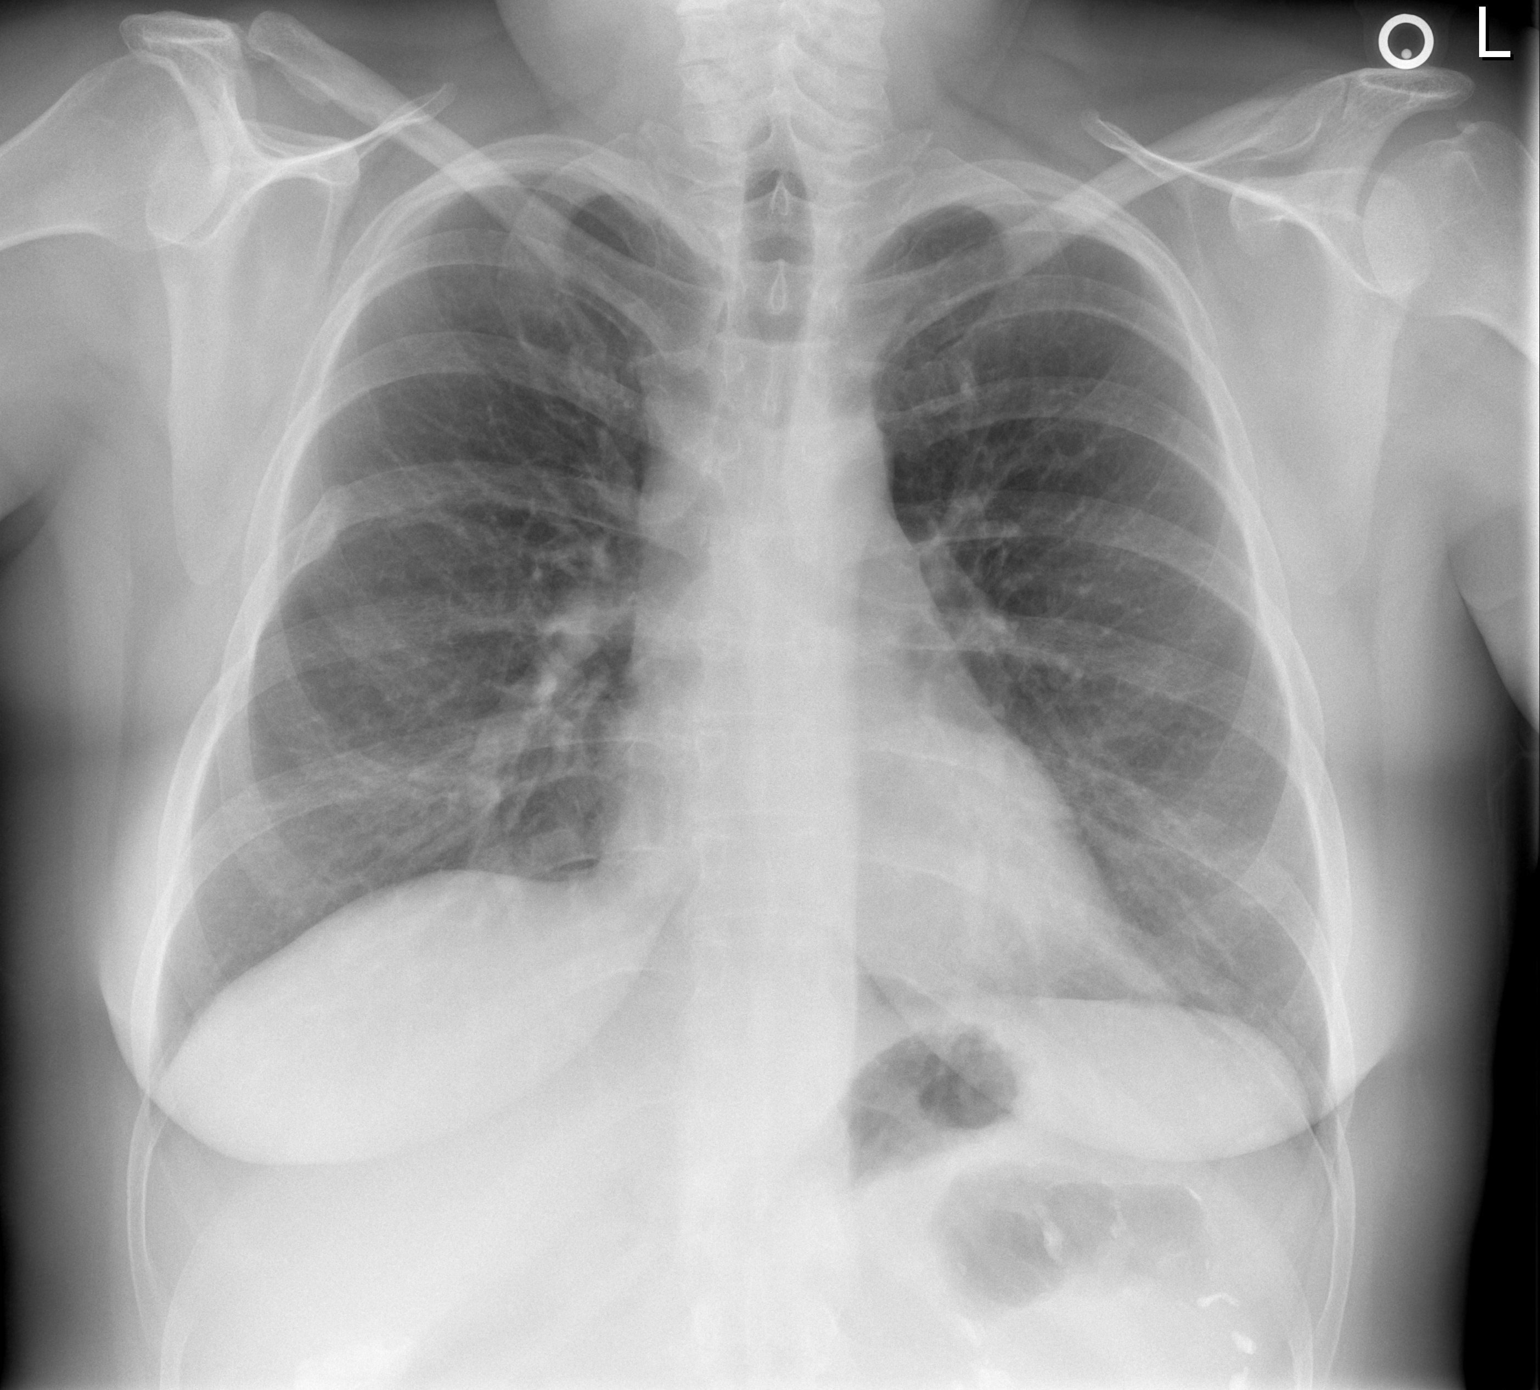

[w chest lat]
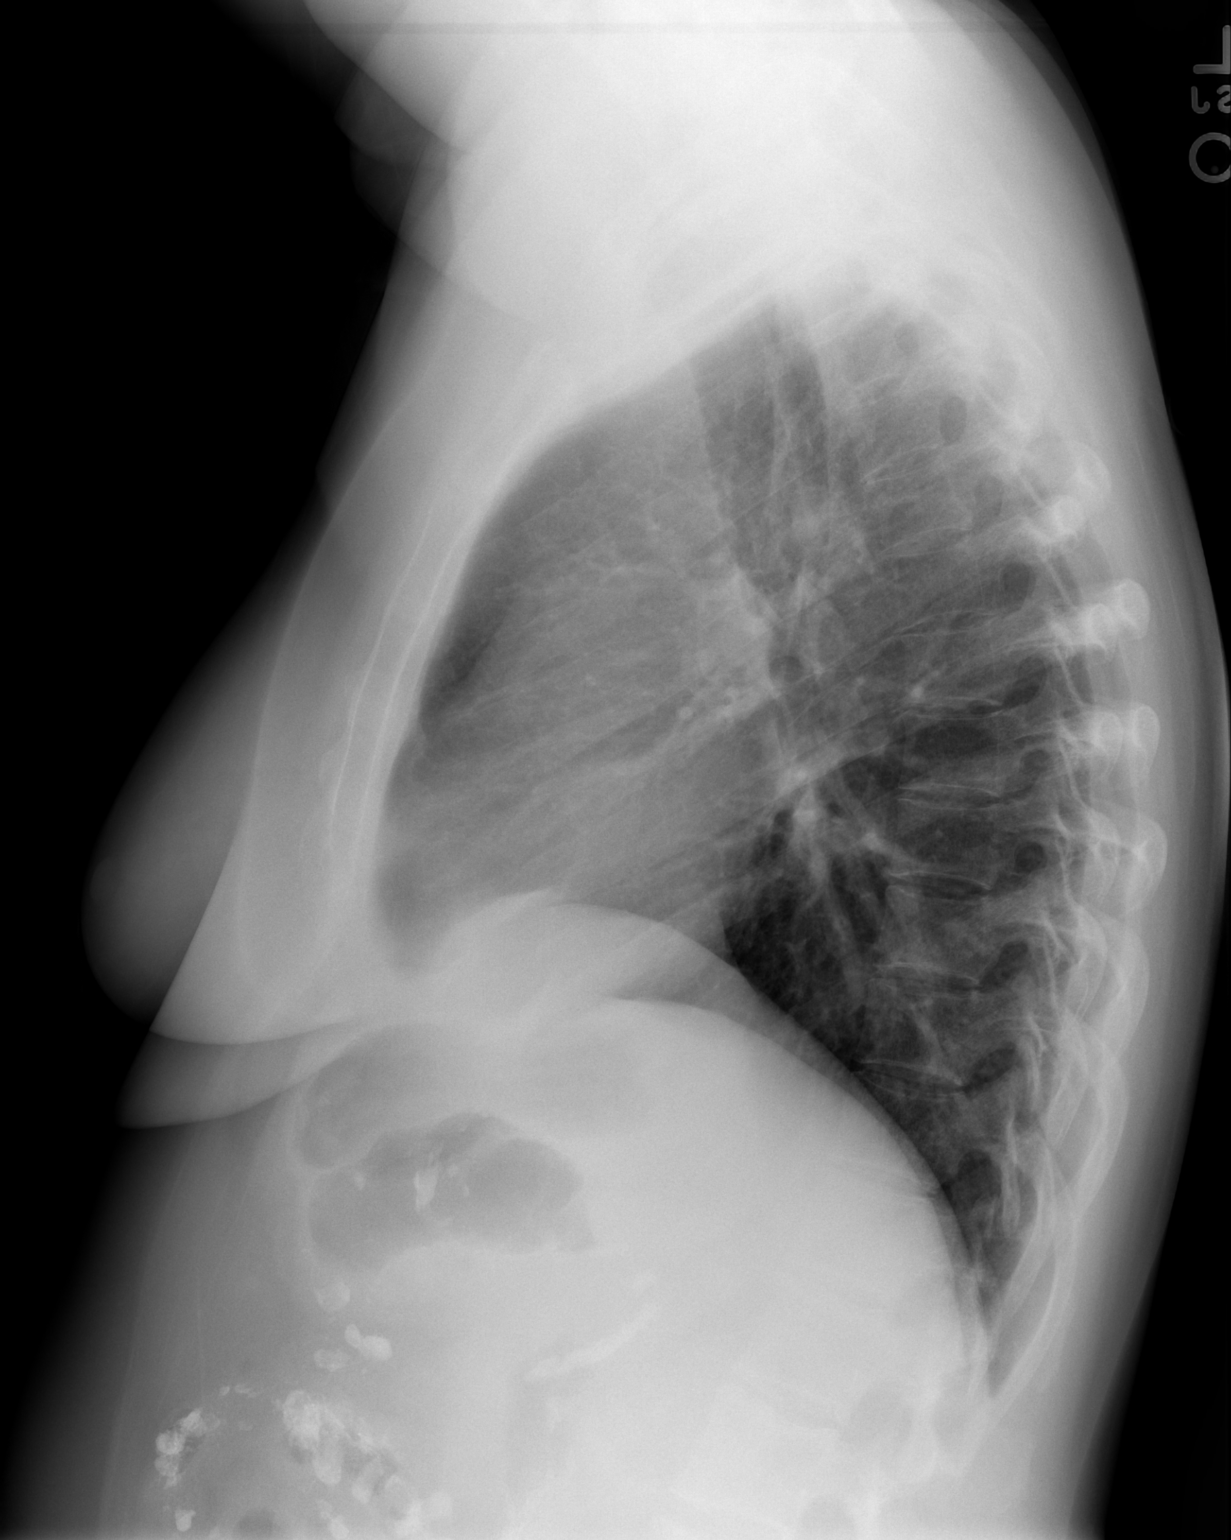

[2 of 2 positions shown; findings below may reference images not displayed]

FINDINGS: The heart remains normal in size.  Clear lungs.  Old,
healed right sixth rib fracture.  Small amount of retained barium
in the colon.
IMPRESSION: No acute abnormality.

## 2012-12-04 MED ORDER — ONDANSETRON 4 MG PO TBDP
4.0000 mg | ORAL_TABLET | Freq: Once | ORAL | Status: AC
  Administered 2012-12-04: 4 mg via ORAL
  Filled 2012-12-04: qty 1

## 2012-12-04 MED ORDER — MEDROXYPROGESTERONE ACETATE 5 MG PO TABS: 5.0000 mg | ORAL_TABLET | Freq: Every day | ORAL | Status: DC

## 2012-12-04 MED ORDER — MECLIZINE HCL 25 MG PO TABS: 25.0000 mg | ORAL_TABLET | Freq: Four times a day (QID) | ORAL | Status: DC

## 2012-12-04 NOTE — ED Notes (Signed)
Pt c/o vaginal bleeding with dizziness
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
## 2012-12-04 NOTE — ED Notes (Signed)
Pt states [facility] and given meclizine for her vertigo.

## 2012-12-05 DIAGNOSIS — Z888 Allergy status to other drugs, medicaments and biological substances status: Secondary | ICD-10-CM | POA: Diagnosis not present

## 2012-12-05 DIAGNOSIS — J019 Acute sinusitis, unspecified: Secondary | ICD-10-CM | POA: Diagnosis not present

## 2012-12-05 DIAGNOSIS — Z880 Allergy status to penicillin: Secondary | ICD-10-CM | POA: Diagnosis not present

## 2012-12-05 DIAGNOSIS — E7800 Pure hypercholesterolemia, unspecified: Secondary | ICD-10-CM | POA: Diagnosis not present

## 2012-12-05 DIAGNOSIS — J3489 Other specified disorders of nose and nasal sinuses: Secondary | ICD-10-CM | POA: Diagnosis not present

## 2012-12-05 DIAGNOSIS — J45909 Unspecified asthma, uncomplicated: Secondary | ICD-10-CM | POA: Diagnosis not present

## 2012-12-05 DIAGNOSIS — M889 Osteitis deformans of unspecified bone: Secondary | ICD-10-CM | POA: Diagnosis not present

## 2012-12-05 DIAGNOSIS — R51 Headache: Secondary | ICD-10-CM | POA: Diagnosis not present

## 2012-12-05 DIAGNOSIS — R42 Dizziness and giddiness: Secondary | ICD-10-CM | POA: Diagnosis not present

## 2012-12-05 DIAGNOSIS — Z881 Allergy status to other antibiotic agents status: Secondary | ICD-10-CM | POA: Diagnosis not present

## 2012-12-05 DIAGNOSIS — J018 Other acute sinusitis: Secondary | ICD-10-CM | POA: Diagnosis not present

## 2012-12-05 DIAGNOSIS — Z886 Allergy status to analgesic agent status: Secondary | ICD-10-CM | POA: Diagnosis not present

## 2012-12-05 DIAGNOSIS — K219 Gastro-esophageal reflux disease without esophagitis: Secondary | ICD-10-CM | POA: Diagnosis not present

## 2012-12-05 DIAGNOSIS — Z79899 Other long term (current) drug therapy: Secondary | ICD-10-CM | POA: Diagnosis not present

## 2012-12-05 DIAGNOSIS — I10 Essential (primary) hypertension: Secondary | ICD-10-CM | POA: Diagnosis not present

## 2012-12-05 DIAGNOSIS — R040 Epistaxis: Secondary | ICD-10-CM | POA: Diagnosis not present

## 2012-12-05 LAB — GC/CHLAMYDIA PROBE AMP
CT Probe RNA: NEGATIVE
GC Probe RNA: NEGATIVE

## 2012-12-09 ENCOUNTER — Ambulatory Visit (INDEPENDENT_AMBULATORY_CARE_PROVIDER_SITE_OTHER): Payer: Medicare Other

## 2012-12-09 ENCOUNTER — Encounter: Payer: Self-pay | Admitting: Physician Assistant

## 2012-12-09 ENCOUNTER — Ambulatory Visit (INDEPENDENT_AMBULATORY_CARE_PROVIDER_SITE_OTHER): Payer: Medicare Other | Admitting: Physician Assistant

## 2012-12-09 VITALS — BP 155/103 | HR 97 | Wt 183.0 lb

## 2012-12-09 DIAGNOSIS — M889 Osteitis deformans of unspecified bone: Secondary | ICD-10-CM | POA: Diagnosis not present

## 2012-12-09 DIAGNOSIS — Z882 Allergy status to sulfonamides status: Secondary | ICD-10-CM | POA: Diagnosis not present

## 2012-12-09 DIAGNOSIS — Z888 Allergy status to other drugs, medicaments and biological substances status: Secondary | ICD-10-CM | POA: Diagnosis not present

## 2012-12-09 DIAGNOSIS — M25519 Pain in unspecified shoulder: Secondary | ICD-10-CM

## 2012-12-09 DIAGNOSIS — R110 Nausea: Secondary | ICD-10-CM | POA: Diagnosis not present

## 2012-12-09 DIAGNOSIS — R0789 Other chest pain: Secondary | ICD-10-CM | POA: Diagnosis not present

## 2012-12-09 DIAGNOSIS — R42 Dizziness and giddiness: Secondary | ICD-10-CM

## 2012-12-09 DIAGNOSIS — M25511 Pain in right shoulder: Secondary | ICD-10-CM

## 2012-12-09 DIAGNOSIS — H53149 Visual discomfort, unspecified: Secondary | ICD-10-CM | POA: Diagnosis not present

## 2012-12-09 DIAGNOSIS — R0602 Shortness of breath: Secondary | ICD-10-CM

## 2012-12-09 DIAGNOSIS — Z87898 Personal history of other specified conditions: Secondary | ICD-10-CM

## 2012-12-09 DIAGNOSIS — I10 Essential (primary) hypertension: Secondary | ICD-10-CM

## 2012-12-09 DIAGNOSIS — N644 Mastodynia: Secondary | ICD-10-CM

## 2012-12-09 DIAGNOSIS — R51 Headache: Secondary | ICD-10-CM | POA: Diagnosis not present

## 2012-12-09 DIAGNOSIS — K219 Gastro-esophageal reflux disease without esophagitis: Secondary | ICD-10-CM | POA: Diagnosis not present

## 2012-12-09 DIAGNOSIS — Z883 Allergy status to other anti-infective agents status: Secondary | ICD-10-CM | POA: Diagnosis not present

## 2012-12-09 DIAGNOSIS — Z79899 Other long term (current) drug therapy: Secondary | ICD-10-CM | POA: Diagnosis not present

## 2012-12-09 DIAGNOSIS — M79609 Pain in unspecified limb: Secondary | ICD-10-CM | POA: Diagnosis not present

## 2012-12-09 MED ORDER — SUCRALFATE 1 GM/10ML PO SUSP: 1.0000 g | Freq: Four times a day (QID) | ORAL | Status: DC

## 2012-12-09 NOTE — Patient Instructions (Addendum)
Start taking carafate with meals. Continue to take Zantac twice a day.   Will get x-rays.   Will order diagnostic breast of left breast.   Take 1/2 micardis until sees Dr. Linford Arnold.   Impingement Syndrome, Rotator Cuff, Bursitis with Rehab Impingement syndrome is a condition that involves inflammation of the tendons of the rotator cuff and the subacromial bursa, that causes pain in the shoulder. The rotator cuff consists of four tendons and muscles that control much of the shoulder and upper arm function. The subacromial bursa is a fluid filled sac that helps reduce friction between the rotator cuff and one of the bones of the shoulder (acromion). Impingement syndrome is usually an overuse injury that causes swelling of the bursa (bursitis), swelling of the tendon (tendonitis), and/or a tear of the tendon (strain). Strains are classified into three categories. Grade 1 strains cause pain, but the tendon is not lengthened. Grade 2 strains include a lengthened ligament, due to the ligament being stretched or partially ruptured. With grade 2 strains there is still function, although the function may be decreased. Grade 3 strains include a complete tear of the tendon or muscle, and function is usually impaired. SYMPTOMS   Pain around the shoulder, often at the outer portion of the upper arm.  Pain that gets worse with shoulder function, especially when reaching overhead or lifting.  Sometimes, aching when not using the arm.  Pain that wakes you up at night.  Sometimes, tenderness, swelling, warmth, or redness over the affected area.  Loss of strength.  Limited motion of the shoulder, especially reaching behind the back (to the back pocket or to unhook bra) or across your body.  Crackling sound (crepitation) when moving the arm.  Biceps tendon pain and inflammation (in the front of the shoulder). Worse when bending the elbow or lifting. CAUSES  Impingement syndrome is often an overuse  injury, in which chronic (repetitive) motions cause the tendons or bursa to become inflamed. A strain occurs when a force is paced on the tendon or muscle that is greater than it can withstand. Common mechanisms of injury include: Stress from sudden increase in duration, frequency, or intensity of training.  Direct hit (trauma) to the shoulder.  Aging, erosion of the tendon with normal use.  Bony bump on shoulder (acromial spur). RISK INCREASES WITH:  Contact sports (football, wrestling, boxing).  Throwing sports (baseball, tennis, volleyball).  Weightlifting and bodybuilding.  Heavy labor.  Previous injury to the rotator cuff, including impingement.  Poor shoulder strength and flexibility.  Failure to warm up properly before activity.  Inadequate protective equipment.  Old age.  Bony bump on shoulder (acromial spur). PREVENTION   Warm up and stretch properly before activity.  Allow for adequate recovery between workouts.  Maintain physical fitness:  Strength, flexibility, and endurance.  Cardiovascular fitness.  Learn and use proper exercise technique. PROGNOSIS  If treated properly, impingement syndrome usually goes away within 6 weeks. Sometimes surgery is required.  RELATED COMPLICATIONS   Longer healing time if not properly treated, or if not given enough time to heal.  Recurring symptoms, that result in a chronic condition.  Shoulder stiffness, frozen shoulder, or loss of motion.  Rotator cuff tendon tear.  Recurring symptoms, especially if activity is resumed too soon, with overuse, with a direct blow, or when using poor technique. TREATMENT  Treatment first involves the use of ice and medicine, to reduce pain and inflammation. The use of strengthening and stretching exercises may help reduce pain with  activity. These exercises may be performed at home or with a therapist. If non-surgical treatment is unsuccessful after more than 6 months, surgery may be  advised. After surgery and rehabilitation, activity is usually possible in 3 months.  MEDICATION  If pain medicine is needed, nonsteroidal anti-inflammatory medicines (aspirin and ibuprofen), or other minor pain relievers (acetaminophen), are often advised.  Do not take pain medicine for 7 days before surgery.  Prescription pain relievers may be given, if your caregiver thinks they are needed. Use only as directed and only as much as you need.  Corticosteroid injections may be given by your caregiver. These injections should be reserved for the most serious cases, because they may only be given a certain number of times. HEAT AND COLD  Cold treatment (icing) should be applied for 10 to 15 minutes every 2 to 3 hours for inflammation and pain, and immediately after activity that aggravates your symptoms. Use ice packs or an ice massage.  Heat treatment may be used before performing stretching and strengthening activities prescribed by your caregiver, physical therapist, or athletic trainer. Use a heat pack or a warm water soak. SEEK MEDICAL CARE IF:   Symptoms get worse or do not improve in 4 to 6 weeks, despite treatment.  New, unexplained symptoms develop. (Drugs used in treatment may produce side effects.) EXERCISES  RANGE OF MOTION (ROM) AND STRETCHING EXERCISES - Impingement Syndrome (Rotator Cuff  Tendinitis, Bursitis) These exercises may help you when beginning to rehabilitate your injury. Your symptoms may go away with or without further involvement from your physician, physical therapist or athletic trainer. While completing these exercises, remember:   Restoring tissue flexibility helps normal motion to return to the joints. This allows healthier, less painful movement and activity.  An effective stretch should be held for at least 30 seconds.  A stretch should never be painful. You should only feel a gentle lengthening or release in the stretched tissue. STRETCH  Flexion,  Standing  Stand with good posture. With an underhand grip on your right / left hand, and an overhand grip on the opposite hand, grasp a broomstick or cane so that your hands are a little more than shoulder width apart.  Keeping your right / left elbow straight and shoulder muscles relaxed, push the stick with your opposite hand, to raise your right / left arm in front of your body and then overhead. Raise your arm until you feel a stretch in your right / left shoulder, but before you have increased shoulder pain.  Try to avoid shrugging your right / left shoulder as your arm rises, by keeping your shoulder blade tucked down and toward your mid-back spine. Hold for __________ seconds.  Slowly return to the starting position. Repeat __________ times. Complete this exercise __________ times per day. STRETCH  Abduction, Supine  Lie on your back. With an underhand grip on your right / left hand and an overhand grip on the opposite hand, grasp a broomstick or cane so that your hands are a little more than shoulder width apart.  Keeping your right / left elbow straight and your shoulder muscles relaxed, push the stick with your opposite hand, to raise your right / left arm out to the side of your body and then overhead. Raise your arm until you feel a stretch in your right / left shoulder, but before you have increased shoulder pain.  Try to avoid shrugging your right / left shoulder as your arm rises, by keeping your shoulder  blade tucked down and toward your mid-back spine. Hold for __________ seconds.  Slowly return to the starting position. Repeat __________ times. Complete this exercise __________ times per day. ROM  Flexion, Active-Assisted  Lie on your back. You may bend your knees for comfort.  Grasp a broomstick or cane so your hands are about shoulder width apart. Your right / left hand should grip the end of the stick, so that your hand is positioned "thumbs-up," as if you were about to  shake hands.  Using your healthy arm to lead, raise your right / left arm overhead, until you feel a gentle stretch in your shoulder. Hold for __________ seconds.  Use the stick to assist in returning your right / left arm to its starting position. Repeat __________ times. Complete this exercise __________ times per day.  ROM - Internal Rotation, Supine   Lie on your back on a firm surface. Place your right / left elbow about 60 degrees away from your side. Elevate your elbow with a folded towel, so that the elbow and shoulder are the same height.  Using a broomstick or cane and your strong arm, pull your right / left hand toward your body until you feel a gentle stretch, but no increase in your shoulder pain. Keep your shoulder and elbow in place throughout the exercise.  Hold for __________ seconds. Slowly return to the starting position. Repeat __________ times. Complete this exercise __________ times per day. STRETCH - Internal Rotation  Place your right / left hand behind your back, palm up.  Throw a towel or belt over your opposite shoulder. Grasp the towel with your right / left hand.  While keeping an upright posture, gently pull up on the towel, until you feel a stretch in the front of your right / left shoulder.  Avoid shrugging your right / left shoulder as your arm rises, by keeping your shoulder blade tucked down and toward your mid-back spine.  Hold for __________ seconds. Release the stretch, by lowering your healthy hand. Repeat __________ times. Complete this exercise __________ times per day. ROM - Internal Rotation   Using an underhand grip, grasp a stick behind your back with both hands.  While standing upright with good posture, slide the stick up your back until you feel a mild stretch in the front of your shoulder.  Hold for __________ seconds. Slowly return to your starting position. Repeat __________ times. Complete this exercise __________ times per day.    STRETCH  Posterior Shoulder Capsule   Stand or sit with good posture. Grasp your right / left elbow and draw it across your chest, keeping it at the same height as your shoulder.  Pull your elbow, so your upper arm comes in closer to your chest. Pull until you feel a gentle stretch in the back of your shoulder.  Hold for __________ seconds. Repeat __________ times. Complete this exercise __________ times per day. STRENGTHENING EXERCISES - Impingement Syndrome (Rotator Cuff Tendinitis, Bursitis) These exercises may help you when beginning to rehabilitate your injury. They may resolve your symptoms with or without further involvement from your physician, physical therapist or athletic trainer. While completing these exercises, remember:  Muscles can gain both the endurance and the strength needed for everyday activities through controlled exercises.  Complete these exercises as instructed by your physician, physical therapist or athletic trainer. Increase the resistance and repetitions only as guided.  You may experience muscle soreness or fatigue, but the pain or discomfort you are trying  to eliminate should never worsen during these exercises. If this pain does get worse, stop and make sure you are following the directions exactly. If the pain is still present after adjustments, discontinue the exercise until you can discuss the trouble with your clinician.  During your recovery, avoid activity or exercises which involve actions that place your injured hand or elbow above your head or behind your back or head. These positions stress the tissues which you are trying to heal. STRENGTH - Scapular Depression and Adduction   With good posture, sit on a firm chair. Support your arms in front of you, with pillows, arm rests, or on a table top. Have your elbows in line with the sides of your body.  Gently draw your shoulder blades down and toward your mid-back spine. Gradually increase the tension,  without tensing the muscles along the top of your shoulders and the back of your neck.  Hold for __________ seconds. Slowly release the tension and relax your muscles completely before starting the next repetition.  After you have practiced this exercise, remove the arm support and complete the exercise in standing as well as sitting position. Repeat __________ times. Complete this exercise __________ times per day.  STRENGTH - Shoulder Abductors, Isometric  With good posture, stand or sit about 4-6 inches from a wall, with your right / left side facing the wall.  Bend your right / left elbow. Gently press your right / left elbow into the wall. Increase the pressure gradually, until you are pressing as hard as you can, without shrugging your shoulder or increasing any shoulder discomfort.  Hold for __________ seconds.  Release the tension slowly. Relax your shoulder muscles completely before you begin the next repetition. Repeat __________ times. Complete this exercise __________ times per day.  STRENGTH - External Rotators, Isometric  Keep your right / left elbow at your side and bend it 90 degrees.  Step into a door frame so that the outside of your right / left wrist can press against the door frame without your upper arm leaving your side.  Gently press your right / left wrist into the door frame, as if you were trying to swing the back of your hand away from your stomach. Gradually increase the tension, until you are pressing as hard as you can, without shrugging your shoulder or increasing any shoulder discomfort.  Hold for __________ seconds.  Release the tension slowly. Relax your shoulder muscles completely before you begin the next repetition. Repeat __________ times. Complete this exercise __________ times per day.  STRENGTH - Supraspinatus   Stand or sit with good posture. Grasp a __________ weight, or an exercise band or tubing, so that your hand is "thumbs-up," like you  are shaking hands.  Slowly lift your right / left arm in a "V" away from your thigh, diagonally into the space between your side and straight ahead. Lift your hand to shoulder height or as far as you can, without increasing any shoulder pain. At first, many people do not lift their hands above shoulder height.  Avoid shrugging your right / left shoulder as your arm rises, by keeping your shoulder blade tucked down and toward your mid-back spine.  Hold for __________ seconds. Control the descent of your hand, as you slowly return to your starting position. Repeat __________ times. Complete this exercise __________ times per day.  STRENGTH - External Rotators  Secure a rubber exercise band or tubing to a fixed object (table, pole) so that  it is at the same height as your right / left elbow when you are standing or sitting on a firm surface.  Stand or sit so that the secured exercise band is at your uninjured side.  Bend your right / left elbow 90 degrees. Place a folded towel or small pillow under your right / left arm, so that your elbow is a few inches away from your side.  Keeping the tension on the exercise band, pull it away from your body, as if pivoting on your elbow. Be sure to keep your body steady, so that the movement is coming only from your rotating shoulder.  Hold for __________ seconds. Release the tension in a controlled manner, as you return to the starting position. Repeat __________ times. Complete this exercise __________ times per day.  STRENGTH - Internal Rotators   Secure a rubber exercise band or tubing to a fixed object (table, pole) so that it is at the same height as your right / left elbow when you are standing or sitting on a firm surface.  Stand or sit so that the secured exercise band is at your right / left side.  Bend your elbow 90 degrees. Place a folded towel or small pillow under your right / left arm so that your elbow is a few inches away from your  side.  Keeping the tension on the exercise band, pull it across your body, toward your stomach. Be sure to keep your body steady, so that the movement is coming only from your rotating shoulder.  Hold for __________ seconds. Release the tension in a controlled manner, as you return to the starting position. Repeat __________ times. Complete this exercise __________ times per day.  STRENGTH - Scapular Protractors, Standing   Stand arms length away from a wall. Place your hands on the wall, keeping your elbows straight.  Begin by dropping your shoulder blades down and toward your mid-back spine.  To strengthen your protractors, keep your shoulder blades down, but slide them forward on your rib cage. It will feel as if you are lifting the back of your rib cage away from the wall. This is a subtle motion and can be challenging to complete. Ask your caregiver for further instruction, if you are not sure you are doing the exercise correctly.  Hold for __________ seconds. Slowly return to the starting position, resting the muscles completely before starting the next repetition. Repeat __________ times. Complete this exercise __________ times per day. STRENGTH - Scapular Protractors, Supine  Lie on your back on a firm surface. Extend your right / left arm straight into the air while holding a __________ weight in your hand.  Keeping your head and back in place, lift your shoulder off the floor.  Hold for __________ seconds. Slowly return to the starting position, and allow your muscles to relax completely before starting the next repetition. Repeat __________ times. Complete this exercise __________ times per day. STRENGTH - Scapular Protractors, Quadruped  Get onto your hands and knees, with your shoulders directly over your hands (or as close as you can be, comfortably).  Keeping your elbows locked, lift the back of your rib cage up into your shoulder blades, so your mid-back rounds out. Keep  your neck muscles relaxed.  Hold this position for __________ seconds. Slowly return to the starting position and allow your muscles to relax completely before starting the next repetition. Repeat __________ times. Complete this exercise __________ times per day.  STRENGTH - Scapular Retractors  Secure a rubber exercise band or tubing to a fixed object (table, pole), so that it is at the height of your shoulders when you are either standing, or sitting on a firm armless chair.  With a palm down grip, grasp an end of the band in each hand. Straighten your elbows and lift your hands straight in front of you, at shoulder height. Step back, away from the secured end of the band, until it becomes tense.  Squeezing your shoulder blades together, draw your elbows back toward your sides, as you bend them. Keep your upper arms lifted away from your body throughout the exercise.  Hold for __________ seconds. Slowly ease the tension on the band, as you reverse the directions and return to the starting position. Repeat __________ times. Complete this exercise __________ times per day. STRENGTH - Shoulder Extensors   Secure a rubber exercise band or tubing to a fixed object (table, pole) so that it is at the height of your shoulders when you are either standing, or sitting on a firm armless chair.  With a thumbs-up grip, grasp an end of the band in each hand. Straighten your elbows and lift your hands straight in front of you, at shoulder height. Step back, away from the secured end of the band, until it becomes tense.  Squeezing your shoulder blades together, pull your hands down to the sides of your thighs. Do not allow your hands to go behind you.  Hold for __________ seconds. Slowly ease the tension on the band, as you reverse the directions and return to the starting position. Repeat __________ times. Complete this exercise __________ times per day.  STRENGTH - Scapular Retractors and External  Rotators   Secure a rubber exercise band or tubing to a fixed object (table, pole) so that it is at the height as your shoulders, when you are either standing, or sitting on a firm armless chair.  With a palm down grip, grasp an end of the band in each hand. Bend your elbows 90 degrees and lift your elbows to shoulder height, at your sides. Step back, away from the secured end of the band, until it becomes tense.  Squeezing your shoulder blades together, rotate your shoulders so that your upper arms and elbows remain stationary, but your fists travel upward to head height.  Hold for __________ seconds. Slowly ease the tension on the band, as you reverse the directions and return to the starting position. Repeat __________ times. Complete this exercise __________ times per day.  STRENGTH - Scapular Retractors and External Rotators, Rowing   Secure a rubber exercise band or tubing to a fixed object (table, pole) so that it is at the height of your shoulders, when you are either standing, or sitting on a firm armless chair.  With a palm down grip, grasp an end of the band in each hand. Straighten your elbows and lift your hands straight in front of you, at shoulder height. Step back, away from the secured end of the band, until it becomes tense.  Step 1: Squeeze your shoulder blades together. Bending your elbows, draw your hands to your chest, as if you are rowing a boat. At the end of this motion, your hands and elbow should be at shoulder height and your elbows should be out to your sides.  Step 2: Rotate your shoulders, to raise your hands above your head. Your forearms should be vertical and your upper arms should be horizontal.  Hold for __________ seconds.  Slowly ease the tension on the band, as you reverse the directions and return to the starting position. Repeat __________ times. Complete this exercise __________ times per day.  STRENGTH  Scapular Depressors  Find a sturdy chair without  wheels, such as a dining room chair.  Keeping your feet on the floor, and your hands on the chair arms, lift your bottom up from the seat, and lock your elbows.  Keeping your elbows straight, allow gravity to pull your body weight down. Your shoulders will rise toward your ears.  Raise your body against gravity by drawing your shoulder blades down your back, shortening the distance between your shoulders and ears. Although your feet should always maintain contact with the floor, your feet should progressively support less body weight, as you get stronger.  Hold for __________ seconds. In a controlled and slow manner, lower your body weight to begin the next repetition. Repeat __________ times. Complete this exercise __________ times per day.  Document Released: 07/31/2005 Document Revised: 10/23/2011 Document Reviewed: 11/12/2008 [facility] Patient Information 2013 [place], [place].

## 2012-12-09 NOTE — Progress Notes (Signed)
Subjective:    Patient ID: Jennifer [name], female    DOB: [birth year], 47 y.o.   MRN: [id number]  HPI Patient presents to the clinic with right arm pain any concern for her overall health.   She was very anxious and stated many times " I really just need to see Dr. Linford Arnold she knows me". She had multiple concerns that she feels have not been followed up on. She has continual dizziness that she was seeing neurology for but her neurologist Dr. Regino Schultze left. She was sent to [facility] until but they also found nothing. Her blood pressure is also elevated today and has been running hight. She stopped her Coreg because it was causing chest tightness like she has experienced with other beta blockers. Right not she is on nothing.   She also complains of SOB and chest tightness. She states when she starts feeling this way she usually has bronchitis/pneumonia. She does also have a history of acid reflux. She has not been taking her zantac and PPI she is not able to tolerate. She is coughing occasionally. Not taking anything to make better.   She is also concerned with her past history of mass found on a CTA over a year ago.[facility] mammogram was done and no abnormalities were found. She is due for another mammogram and is still complaining of left breat fullness/pain. She did not like that [facility] had not spoken with her directly about mammogram and would like to go somewhere else.  Right shoulder pain is ongoing for 2 weeks. Her left shoulder is the one that in the past has given her all the problems. She has a history of bilateral partial rotater cuff tears. She denies any new injuries. She can move shoulder in all directions but often feels like catches. She has some weakness but denies andy numbness or tingling. Tried heat and tylenol but does not like to take medication.     Review of Systems     Objective:   Physical Exam  Constitutional: She is oriented to person, place,  and time.  Very anxious.   HENT:  Head: Normocephalic and atraumatic.  Right Ear: External ear normal.  Left Ear: External ear normal.  Nose: Nose normal.  Mouth/Throat: Oropharynx is clear and moist.  Eyes: Conjunctivae are normal.  Neck: Normal range of motion. Neck supple.  Cardiovascular: Normal rate, regular rhythm and normal heart sounds.   Pulmonary/Chest: Effort normal and breath sounds normal. She has no wheezes.  Musculoskeletal:  ROM of right shoulder Normal ROM. Strength 5/5. No pain over acriomion and clavicle.   Lymphadenopathy:    She has no cervical adenopathy.  Neurological: She is alert and oriented to person, place, and time.  Skin: Skin is warm and dry.  Psychiatric:  Paced around the room. Cried at one point.          Assessment & Plan:  SOB/Chest tightness/acid reflux- Will get chest xray. I suspect this could be due to acid reflux. Gave rx for carafate and encouraged her to take Zantac BID. Follow up if not improving.   Left breast pain/fullness- Will order diagnostic at breast center to evaluate. History of mass on CTA over a year ago.  HTN- Gave samples of micardis she stated she had tolerated in the past. Gave 40mg  and told to cut in half until appt with Dr.metheney on May 6th. Encouraged to take may also make her dizziness better.  Right shoulder pain- will get xray.  Gave exercises to start. Reassured that I did not think there were any complete tears. PT and NSAIDS are first line therapy and then if not improving can consider MRI.    Dizziness- this is ongoing and will let Dr. Linford Arnold and neurologist work together and see if resolution can be made. May need to refer to another neurologist.    Spent 45 minutes with patient and greater than 50 percent of visit was spent counseling regarding treatment of all ongoing issues.

## 2012-12-10 DIAGNOSIS — F451 Undifferentiated somatoform disorder: Secondary | ICD-10-CM | POA: Diagnosis not present

## 2012-12-10 DIAGNOSIS — F4310 Post-traumatic stress disorder, unspecified: Secondary | ICD-10-CM | POA: Diagnosis not present

## 2012-12-10 DIAGNOSIS — R42 Dizziness and giddiness: Secondary | ICD-10-CM | POA: Diagnosis not present

## 2012-12-10 DIAGNOSIS — R51 Headache: Secondary | ICD-10-CM | POA: Diagnosis not present

## 2012-12-10 IMAGING — CR DG CHEST 2V
2 series · 2 of 2 positions shown · non-contrast
Comparison: 09/11/2011

CLINICAL DATA: Chest pain and shortness of breath.  Tachycardia.

CHEST - 2 VIEW

[w chest pa]
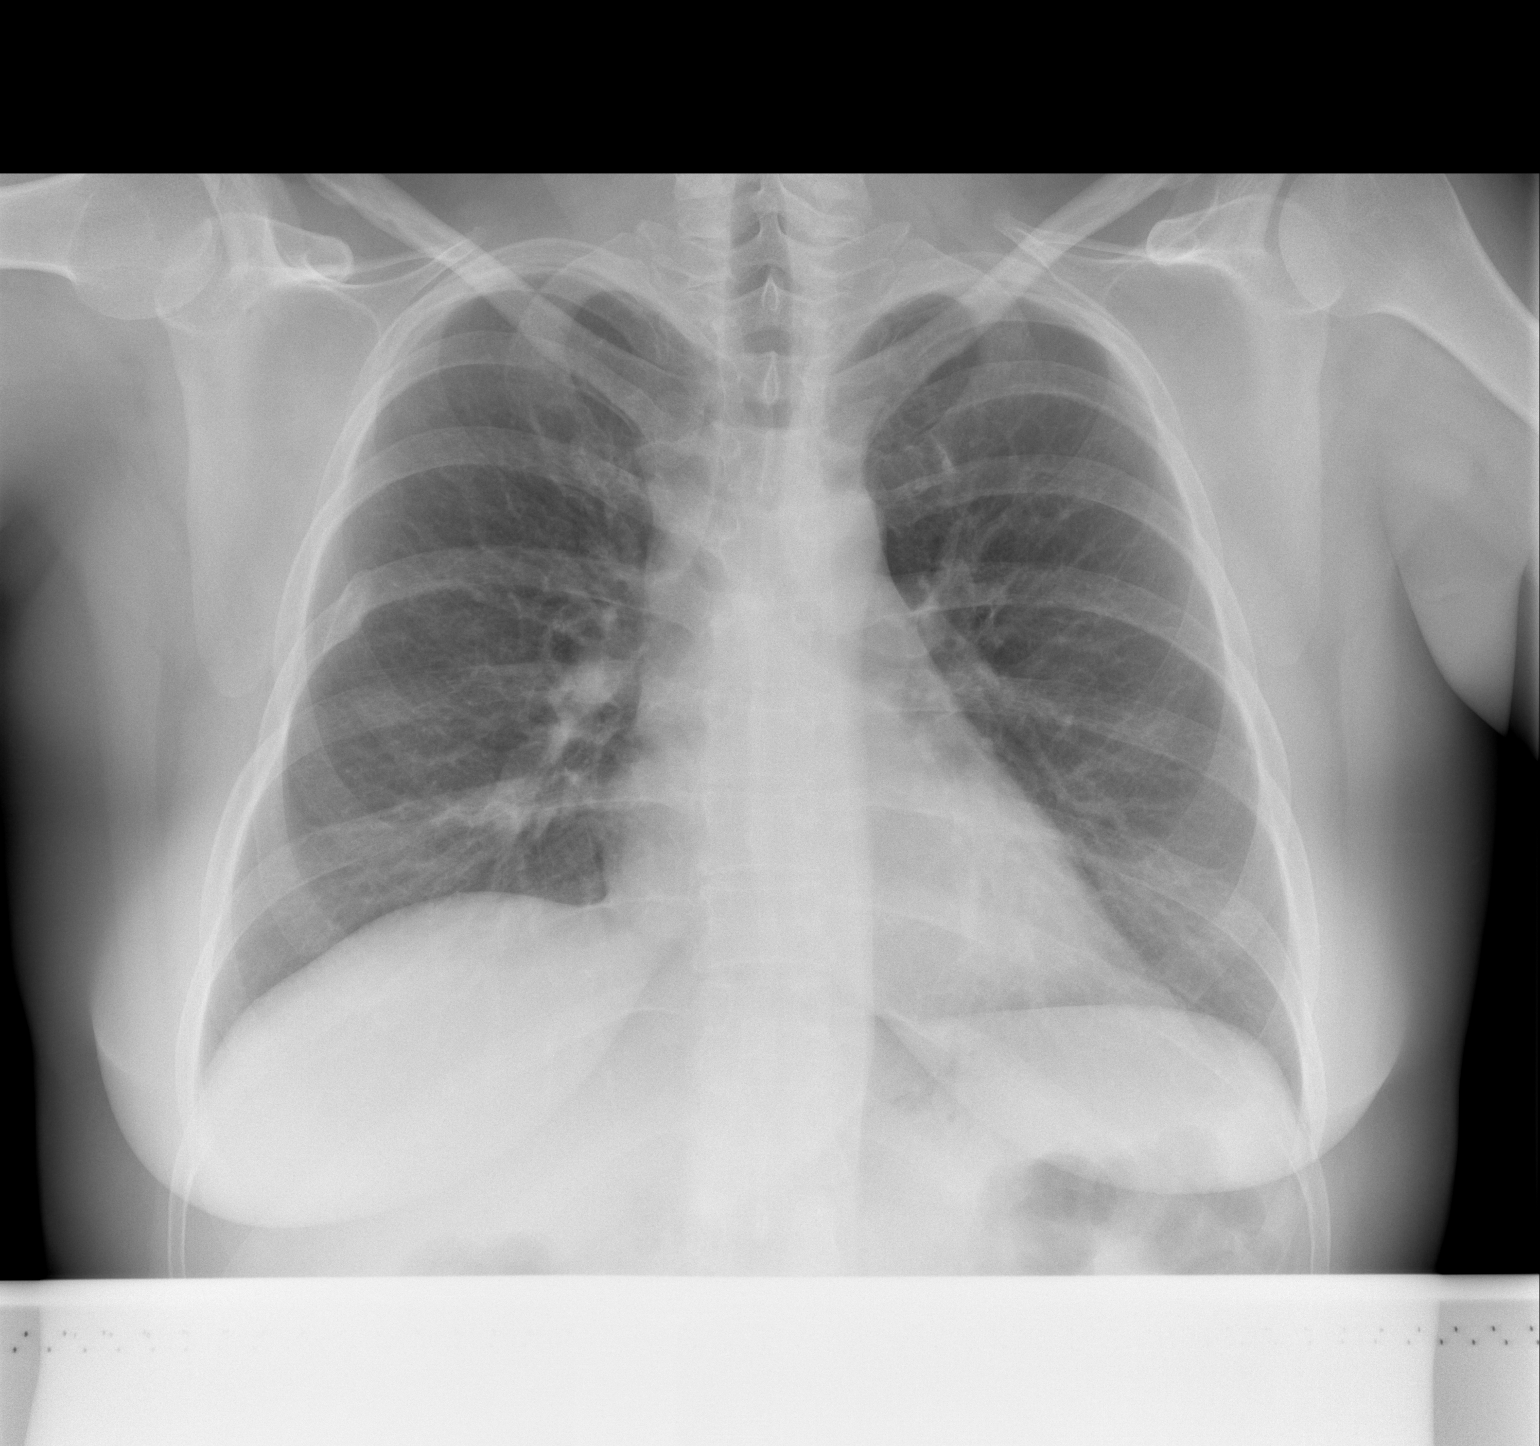

[w chest lat]
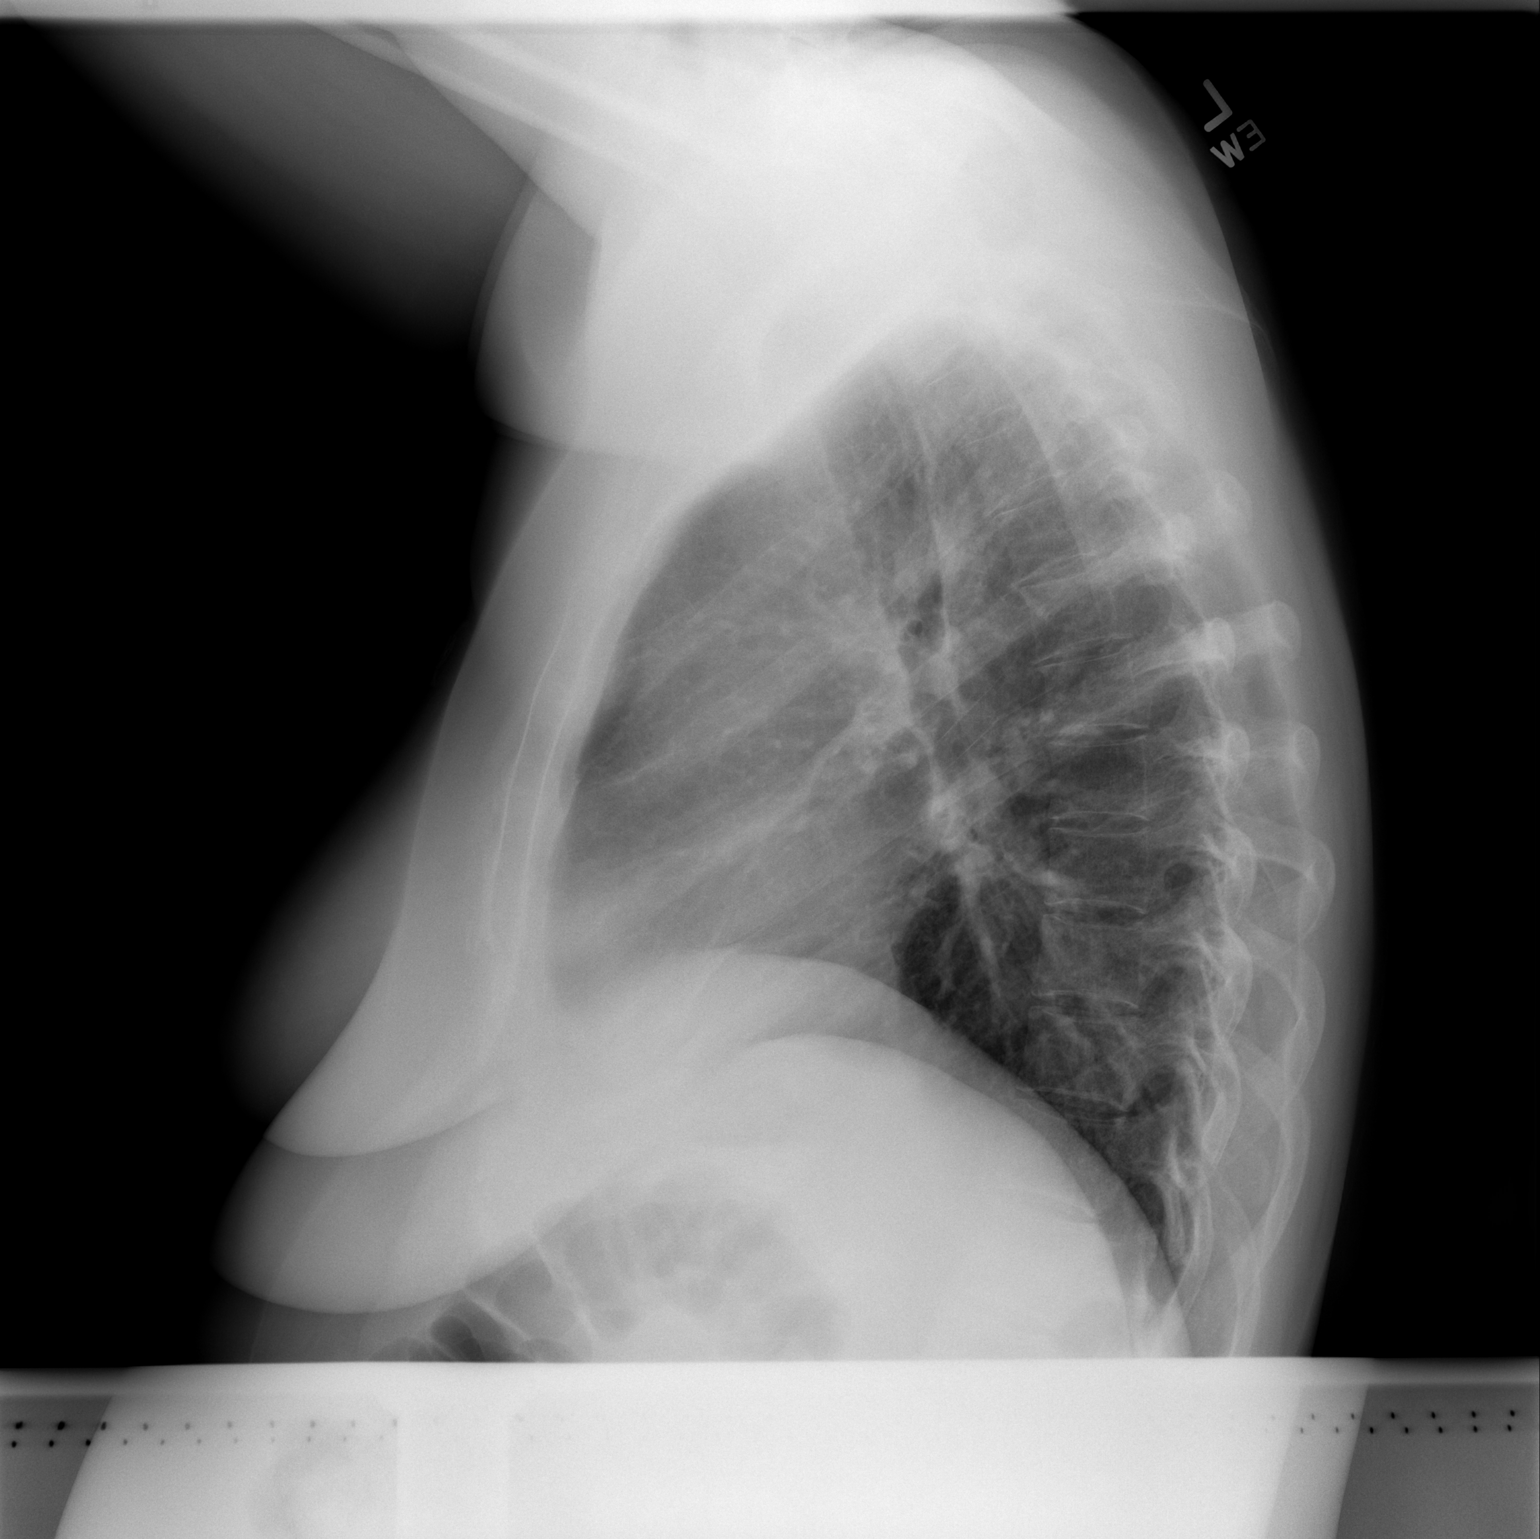

[2 of 2 positions shown; findings below may reference images not displayed]

FINDINGS: Heart size and vascularity are normal and the lungs are
clear.  Old healed right sixth rib fracture.  No acute osseous
abnormality.
IMPRESSION: No acute disease.

## 2012-12-10 IMAGING — CT CT HEAD W/O CM
1 series · 16 of 30 positions shown, 20 images · non-contrast
Comparison: 01/28/2011

CLINICAL DATA: Posterior headache.  Hypertension.

CT HEAD WITHOUT CONTRAST
TECHNIQUE: Contiguous axial images were obtained from the base of
the skull through the vertex without contrast.

[Series 2: head 4.8 h37s · axial · 0.41mm/px · z∈[-128,+36]mm · 16 of 36 slices shown, 20 images]
[im 2/36  brain]
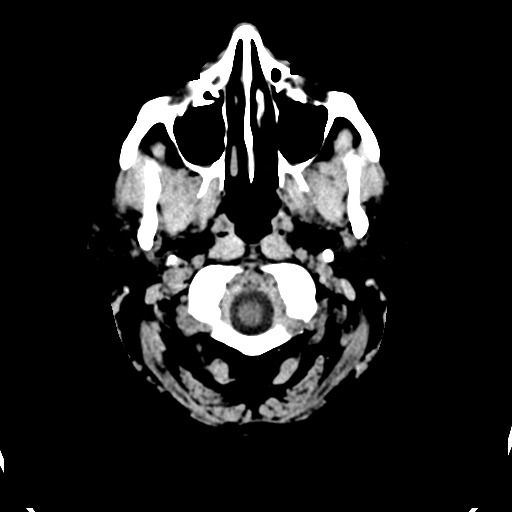
[im 2/36  bone]
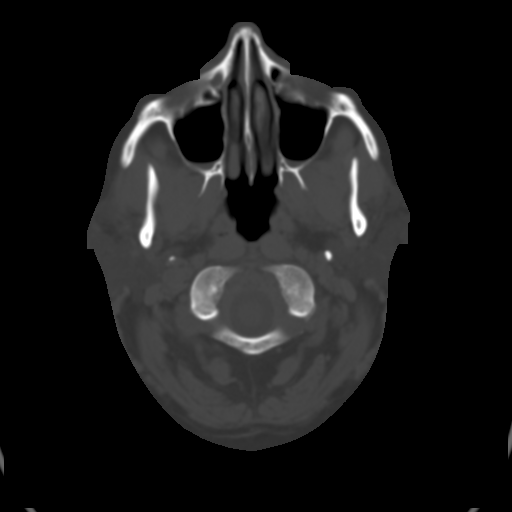
[im 4/36  brain]
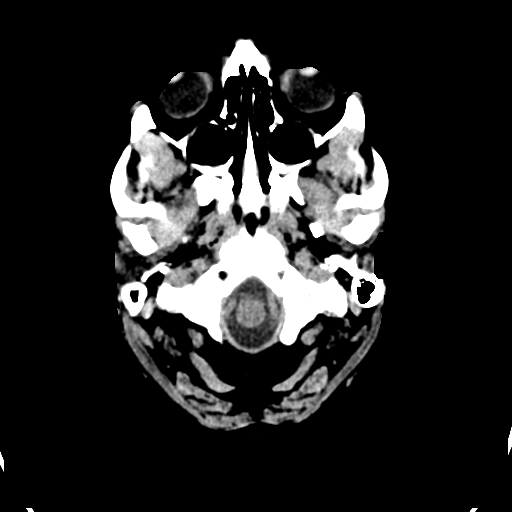
[im 7/36  brain]
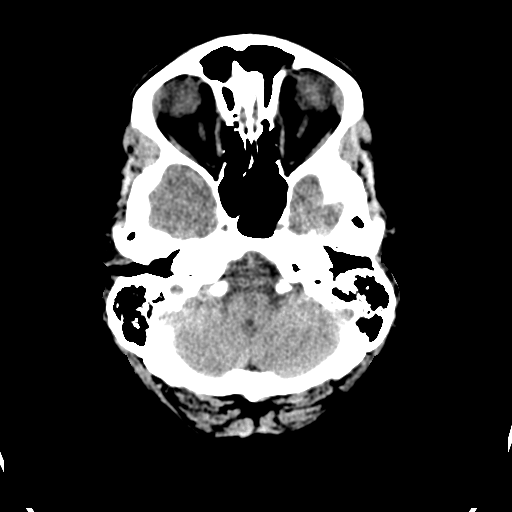
[im 9/36  brain]
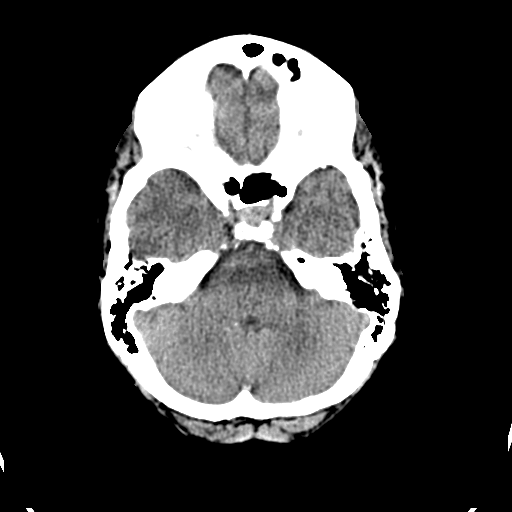
[im 10/36  brain]
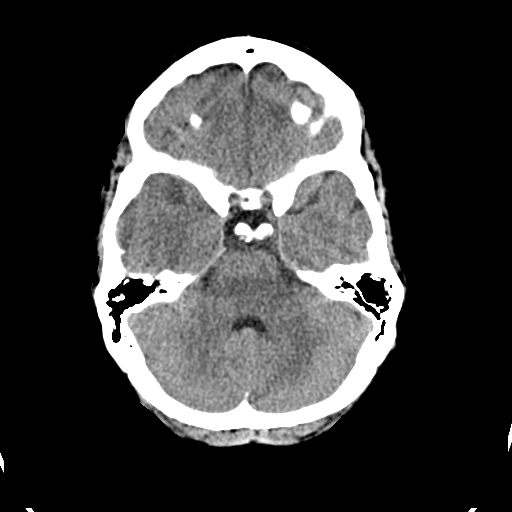
[im 10/36  bone]
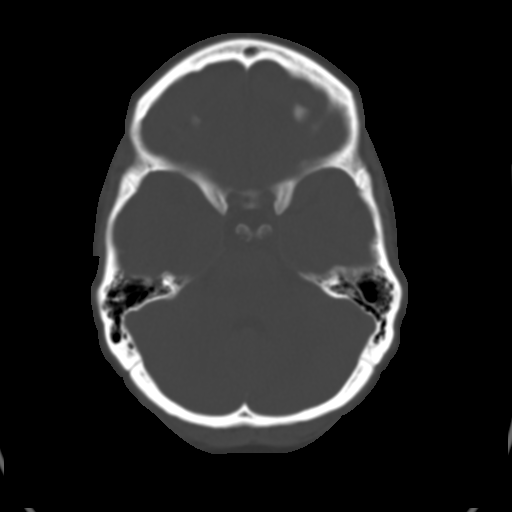
[im 13/36  brain]
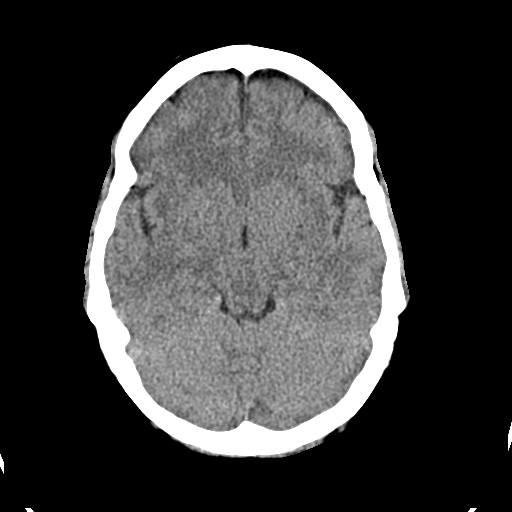
[im 15/36  brain]
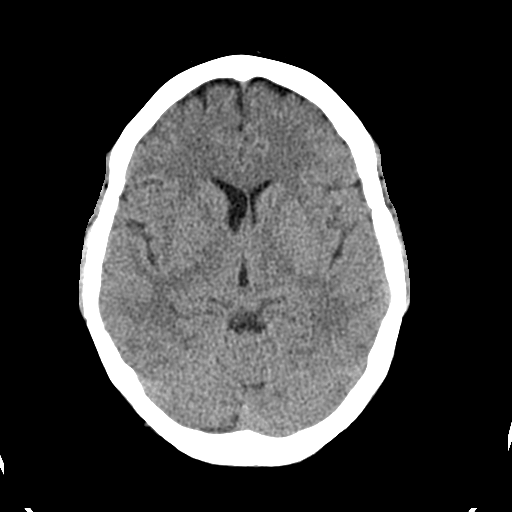
[im 17/36  brain]
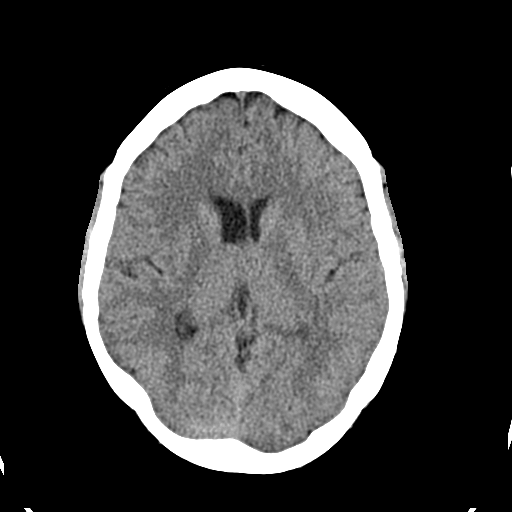
[im 19/36  brain]
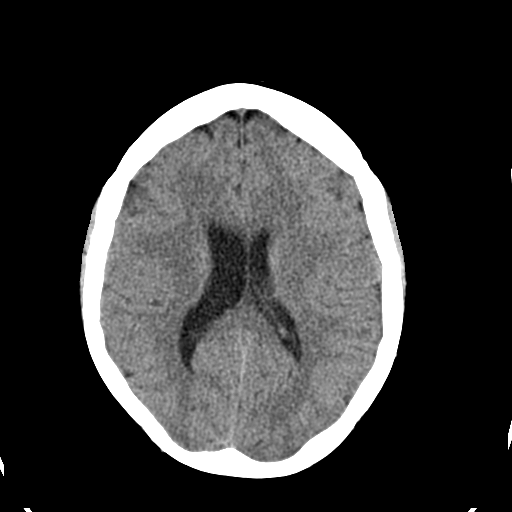
[im 19/36  bone]
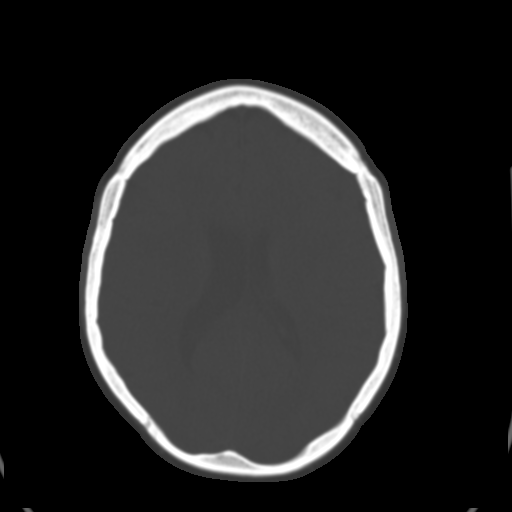
[im 21/36  brain]
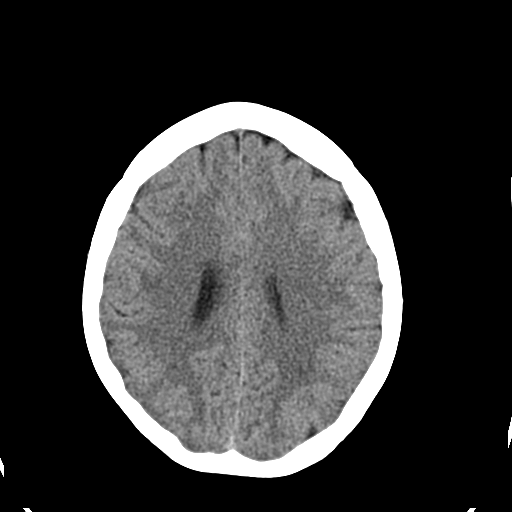
[im 23/36  brain]
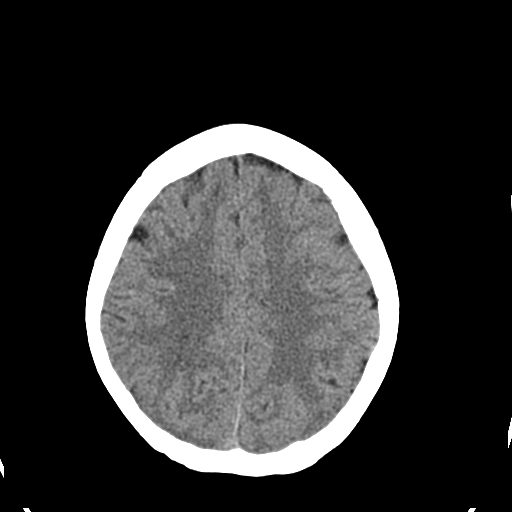
[im 26/36  brain]
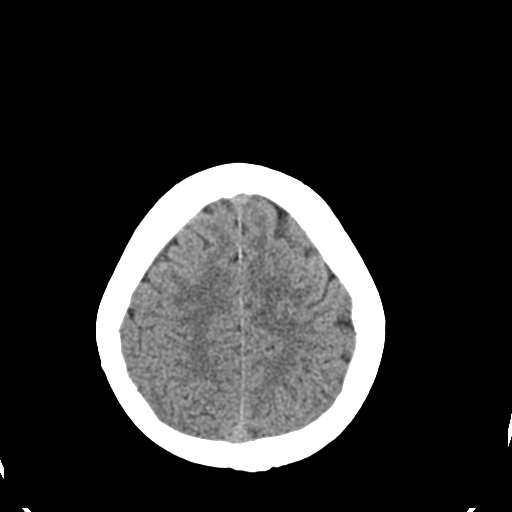
[im 27/36  brain]
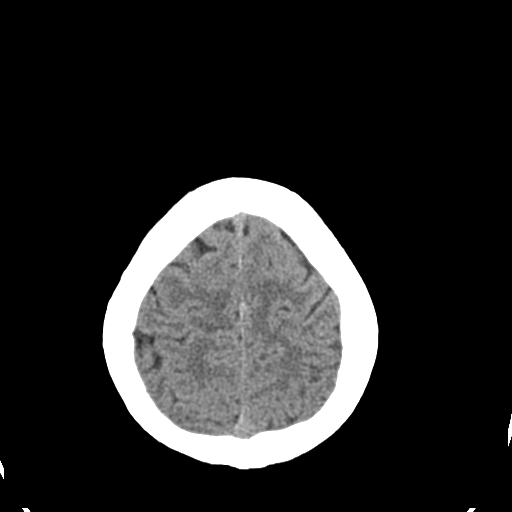
[im 27/36  bone]
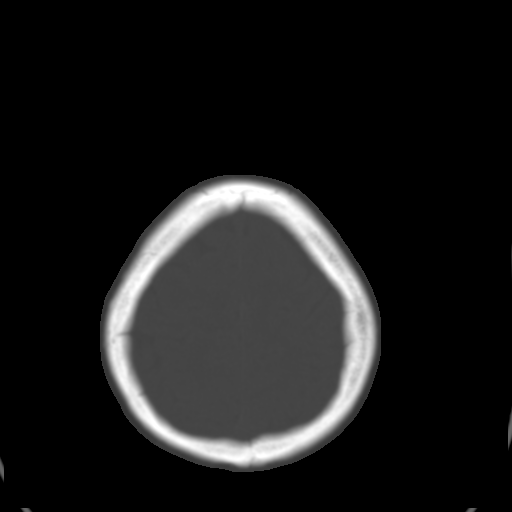
[im 29/36  brain]
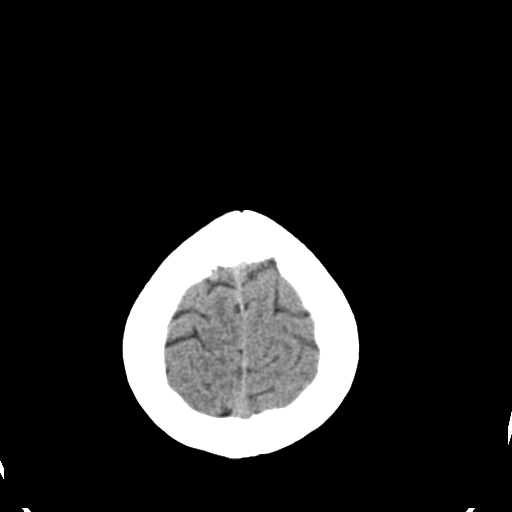
[im 32/36  brain]
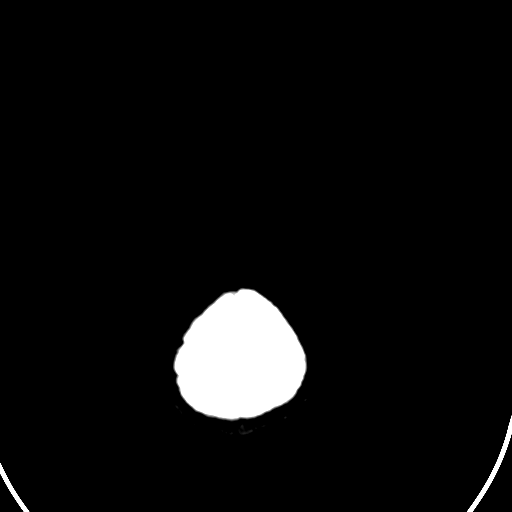
[im 34/36  brain]
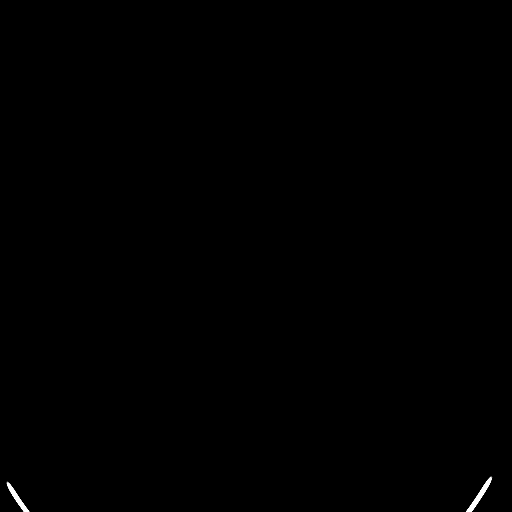

[16 of 30 positions shown; findings below may reference images not displayed]

FINDINGS: There is no acute intracranial hemorrhage, infarction, or
mass.  Asymmetry of the lateral ventricles is felt to be
congenital.  Brain parenchyma is otherwise normal.  Osseous
structures are normal.
IMPRESSION: No acute abnormalities.

## 2012-12-11 ENCOUNTER — Telehealth: Payer: Self-pay | Admitting: Neurology

## 2012-12-11 DIAGNOSIS — F339 Major depressive disorder, recurrent, unspecified: Secondary | ICD-10-CM | POA: Diagnosis not present

## 2012-12-11 NOTE — Telephone Encounter (Signed)
Pt called stating she was an old [facility] pt and wanted to establish care w/ Dr. Arbutus Leas. Dr. Arbutus Leas has reviewed the pt's info and denied an appt request at this time. Per Dr. Arbutus Leas, the pt needs to see a general neurologist and Dr. Smiley Houseman (our general neurologist) is scheduled to leave in July. We do not want to establish care w/ him at this time and then have the pt be without a neurologist again in July. I spoke w/ pt and advised of her of this. Advised pt that Dr. Arbutus Leas specializes in movement d/o and we can not sch an appt at this time. Pt says she has notes and multiple MRI's available from practices outside of [facility] that she wants Dr. Arbutus Leas to review. Advised pt we require MD referrals, if she has notes available from another practice and wants those reviewed by neuro, she needs to call that office and speak with them. That office can determine if a referral is appropriate. Pt says she will get a referral from a physician but was reminded again that we do not have a provider available at this time to see her. She says she will call back in July. Pt has treated with multiple neurology and neurosurgery providers at multiple offices. / Roanna Raider

## 2012-12-12 DIAGNOSIS — K219 Gastro-esophageal reflux disease without esophagitis: Secondary | ICD-10-CM | POA: Diagnosis not present

## 2012-12-12 DIAGNOSIS — H8110 Benign paroxysmal vertigo, unspecified ear: Secondary | ICD-10-CM | POA: Diagnosis not present

## 2012-12-17 ENCOUNTER — Encounter: Payer: Self-pay | Admitting: Family Medicine

## 2012-12-17 ENCOUNTER — Ambulatory Visit (INDEPENDENT_AMBULATORY_CARE_PROVIDER_SITE_OTHER): Payer: Medicare Other | Admitting: Family Medicine

## 2012-12-17 VITALS — BP 147/92 | HR 93 | Wt 186.0 lb

## 2012-12-17 DIAGNOSIS — I10 Essential (primary) hypertension: Secondary | ICD-10-CM | POA: Diagnosis not present

## 2012-12-17 DIAGNOSIS — R21 Rash and other nonspecific skin eruption: Secondary | ICD-10-CM | POA: Diagnosis not present

## 2012-12-17 DIAGNOSIS — E559 Vitamin D deficiency, unspecified: Secondary | ICD-10-CM

## 2012-12-17 DIAGNOSIS — R6889 Other general symptoms and signs: Secondary | ICD-10-CM

## 2012-12-17 DIAGNOSIS — R635 Abnormal weight gain: Secondary | ICD-10-CM | POA: Diagnosis not present

## 2012-12-17 DIAGNOSIS — F339 Major depressive disorder, recurrent, unspecified: Secondary | ICD-10-CM | POA: Diagnosis not present

## 2012-12-17 DIAGNOSIS — E269 Hyperaldosteronism, unspecified: Secondary | ICD-10-CM | POA: Diagnosis not present

## 2012-12-17 MED ORDER — SPIRONOLACTONE 25 MG PO TABS: 12.5000 mg | ORAL_TABLET | Freq: Every day | ORAL | Status: DC

## 2012-12-17 NOTE — Progress Notes (Signed)
  Subjective:    Patient ID: Jennifer [name], female    DOB: [birth year], 47 y.o.   MRN: [id number]  HPI Has been itching on her chin.  Says has been coughing more than usual. Says some occ plegm that is green. She feels like there congestion is in her throat and not her chest. Hasn't tried using her inhalers.  She has been doing her nasal saline rinses. No fever or other respiratory or cold symptoms.  She wants to to retry the spironolactone for hyperalodsteronism and blood pressure. Her weight is up today.  She doesn't weight at home.  Went to [employer] last weekend.  She says she never sweats and just overheats when exerts herself.  Says mucous builds up and says makes it harder to eat.    Endocrinology told er has vit D de. Given rx for 50,000 unit but it scared her to take this. She bought some OTC 2500IU but hasn't started it. Wanted to make sure it was ok.     Review of Systems     Objective:   Physical Exam  Constitutional: She is oriented to person, place, and time. She appears well-developed and well-nourished.  HENT:  Head: Normocephalic and atraumatic.  Right Ear: External ear normal.  Left Ear: External ear normal.  Nose: Nose normal.  Mouth/Throat: Oropharynx is clear and moist.  TMs and canals are clear.   Eyes: Conjunctivae and EOM are normal. Pupils are equal, round, and reactive to light.  Neck: Neck supple. No thyromegaly present.  Cardiovascular: Normal rate, regular rhythm and normal heart sounds.   Pulmonary/Chest: Effort normal and breath sounds normal. She has no wheezes.  Lymphadenopathy:    She has no cervical adenopathy.  Neurological: She is alert and oriented to person, place, and time.  Skin: Skin is warm and dry.  Psychiatric: She has a normal mood and affect.          Assessment & Plan:  Facial itching - Recommend apply topical hydrocortisone. Can apply for 2-3 weeks.    HTN  - Uncontrolled.  She says coreg caused CP so stopped it. She  hasn't started the med that [place] gave her.    Hyperaldosteronism - Will start spironlactone. Can work as diuretic, BP control nad for her aldosteronism.    Vit D deficiency - She is starting 2500 IU daily. Reassured her that she should tolerate it fine.    Throat congestion-recommend running a humidifier in the home to help keep secretions moist. Can also try guaifenesin. She has used occasionally in the past. Also make sure drinking plenty of fluids to keep the secretions thin. Avoid coughing excessively.  May need to tx her allregies as well.

## 2012-12-18 DIAGNOSIS — I10 Essential (primary) hypertension: Secondary | ICD-10-CM | POA: Diagnosis not present

## 2012-12-18 DIAGNOSIS — R0609 Other forms of dyspnea: Secondary | ICD-10-CM | POA: Diagnosis not present

## 2012-12-18 DIAGNOSIS — Z888 Allergy status to other drugs, medicaments and biological substances status: Secondary | ICD-10-CM | POA: Diagnosis not present

## 2012-12-18 DIAGNOSIS — R059 Cough, unspecified: Secondary | ICD-10-CM | POA: Diagnosis not present

## 2012-12-18 DIAGNOSIS — J029 Acute pharyngitis, unspecified: Secondary | ICD-10-CM | POA: Diagnosis not present

## 2012-12-18 DIAGNOSIS — R0989 Other specified symptoms and signs involving the circulatory and respiratory systems: Secondary | ICD-10-CM | POA: Diagnosis not present

## 2012-12-18 DIAGNOSIS — R05 Cough: Secondary | ICD-10-CM | POA: Diagnosis not present

## 2012-12-18 DIAGNOSIS — Z882 Allergy status to sulfonamides status: Secondary | ICD-10-CM | POA: Diagnosis not present

## 2012-12-18 DIAGNOSIS — Z880 Allergy status to penicillin: Secondary | ICD-10-CM | POA: Diagnosis not present

## 2012-12-19 DIAGNOSIS — J3489 Other specified disorders of nose and nasal sinuses: Secondary | ICD-10-CM | POA: Diagnosis not present

## 2012-12-19 DIAGNOSIS — J45901 Unspecified asthma with (acute) exacerbation: Secondary | ICD-10-CM | POA: Diagnosis not present

## 2012-12-19 DIAGNOSIS — R05 Cough: Secondary | ICD-10-CM | POA: Diagnosis not present

## 2012-12-19 DIAGNOSIS — R0989 Other specified symptoms and signs involving the circulatory and respiratory systems: Secondary | ICD-10-CM | POA: Diagnosis not present

## 2012-12-19 DIAGNOSIS — R109 Unspecified abdominal pain: Secondary | ICD-10-CM | POA: Diagnosis not present

## 2012-12-19 DIAGNOSIS — J029 Acute pharyngitis, unspecified: Secondary | ICD-10-CM | POA: Diagnosis not present

## 2012-12-19 DIAGNOSIS — I10 Essential (primary) hypertension: Secondary | ICD-10-CM | POA: Diagnosis not present

## 2012-12-19 DIAGNOSIS — R0609 Other forms of dyspnea: Secondary | ICD-10-CM | POA: Diagnosis not present

## 2012-12-19 DIAGNOSIS — R490 Dysphonia: Secondary | ICD-10-CM | POA: Diagnosis not present

## 2012-12-19 DIAGNOSIS — R059 Cough, unspecified: Secondary | ICD-10-CM | POA: Diagnosis not present

## 2012-12-19 DIAGNOSIS — J309 Allergic rhinitis, unspecified: Secondary | ICD-10-CM | POA: Diagnosis not present

## 2012-12-20 DIAGNOSIS — K120 Recurrent oral aphthae: Secondary | ICD-10-CM | POA: Diagnosis not present

## 2012-12-23 ENCOUNTER — Encounter: Payer: Self-pay | Admitting: Family Medicine

## 2012-12-23 ENCOUNTER — Ambulatory Visit (INDEPENDENT_AMBULATORY_CARE_PROVIDER_SITE_OTHER): Payer: Medicare Other | Admitting: Sports Medicine

## 2012-12-23 ENCOUNTER — Ambulatory Visit (INDEPENDENT_AMBULATORY_CARE_PROVIDER_SITE_OTHER): Payer: Medicare Other | Admitting: Family Medicine

## 2012-12-23 VITALS — BP 146/90 | HR 94 | Wt 186.0 lb

## 2012-12-23 DIAGNOSIS — R079 Chest pain, unspecified: Secondary | ICD-10-CM | POA: Diagnosis not present

## 2012-12-23 DIAGNOSIS — R300 Dysuria: Secondary | ICD-10-CM | POA: Diagnosis not present

## 2012-12-23 DIAGNOSIS — R630 Anorexia: Secondary | ICD-10-CM | POA: Diagnosis not present

## 2012-12-23 DIAGNOSIS — R42 Dizziness and giddiness: Secondary | ICD-10-CM | POA: Diagnosis not present

## 2012-12-23 DIAGNOSIS — E860 Dehydration: Secondary | ICD-10-CM

## 2012-12-23 DIAGNOSIS — I10 Essential (primary) hypertension: Secondary | ICD-10-CM | POA: Diagnosis not present

## 2012-12-23 DIAGNOSIS — K120 Recurrent oral aphthae: Secondary | ICD-10-CM

## 2012-12-23 DIAGNOSIS — Z888 Allergy status to other drugs, medicaments and biological substances status: Secondary | ICD-10-CM | POA: Diagnosis not present

## 2012-12-23 DIAGNOSIS — Z881 Allergy status to other antibiotic agents status: Secondary | ICD-10-CM | POA: Diagnosis not present

## 2012-12-23 DIAGNOSIS — Z880 Allergy status to penicillin: Secondary | ICD-10-CM | POA: Diagnosis not present

## 2012-12-23 DIAGNOSIS — Z79899 Other long term (current) drug therapy: Secondary | ICD-10-CM | POA: Diagnosis not present

## 2012-12-23 DIAGNOSIS — K1370 Unspecified lesions of oral mucosa: Secondary | ICD-10-CM | POA: Diagnosis not present

## 2012-12-23 DIAGNOSIS — Z882 Allergy status to sulfonamides status: Secondary | ICD-10-CM | POA: Diagnosis not present

## 2012-12-23 LAB — POCT URINALYSIS DIPSTICK
Bilirubin, UA: NEGATIVE
Blood, UA: NEGATIVE
Glucose, UA: NEGATIVE
Ketones, UA: NEGATIVE
Leukocytes, UA: NEGATIVE
Nitrite, UA: NEGATIVE
Protein, UA: NEGATIVE
Spec Grav, UA: >=1.03
Urobilinogen, UA: 0.2
pH, UA: 6
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
## 2012-12-23 NOTE — Assessment & Plan Note (Signed)
20-gauge intravenous line placed in the left hand. 1 L of normal saline infused over one hour.

## 2012-12-23 NOTE — Progress Notes (Signed)
    Jennifer [name] presents with dehydration. I placed a 20-gauge angiocatheter into the left hand. 1 L of normal saline was infused over one hour. IV was removed, patient was in stable position.

## 2012-12-24 ENCOUNTER — Telehealth: Payer: Self-pay | Admitting: *Deleted

## 2012-12-24 NOTE — Telephone Encounter (Signed)
Continue the Micardis. It may take a couple days to bring the blood pressure down.

## 2012-12-24 NOTE — Telephone Encounter (Signed)
Feels off in the head, BP elevated 160/90-100. Took Micardis earlier today and BP staying elevated 157/100, took it at 12:30 today. Pt states feels worse today than yesterday. Please advise

## 2012-12-25 ENCOUNTER — Ambulatory Visit (INDEPENDENT_AMBULATORY_CARE_PROVIDER_SITE_OTHER): Payer: Medicare Other | Admitting: Family Medicine

## 2012-12-25 ENCOUNTER — Encounter: Payer: Self-pay | Admitting: Family Medicine

## 2012-12-25 VITALS — BP 142/95 | HR 95 | Temp 98.4°F | Wt 184.0 lb

## 2012-12-25 DIAGNOSIS — R0602 Shortness of breath: Secondary | ICD-10-CM | POA: Diagnosis not present

## 2012-12-25 DIAGNOSIS — R42 Dizziness and giddiness: Secondary | ICD-10-CM

## 2012-12-25 DIAGNOSIS — R5381 Other malaise: Secondary | ICD-10-CM

## 2012-12-25 DIAGNOSIS — R0789 Other chest pain: Secondary | ICD-10-CM

## 2012-12-25 DIAGNOSIS — R5383 Other fatigue: Secondary | ICD-10-CM | POA: Diagnosis not present

## 2012-12-25 DIAGNOSIS — D509 Iron deficiency anemia, unspecified: Secondary | ICD-10-CM

## 2012-12-25 NOTE — Telephone Encounter (Signed)
Pt notifeid and she states she just doesn't feel good- has sore throat, achy, some occasional nausea. Instructed to schedule appt to be seen if not feeling well. Barry Dienes, LPN
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
## 2012-12-26 ENCOUNTER — Encounter (HOSPITAL_BASED_OUTPATIENT_CLINIC_OR_DEPARTMENT_OTHER): Payer: Self-pay | Admitting: *Deleted

## 2012-12-26 ENCOUNTER — Emergency Department (HOSPITAL_BASED_OUTPATIENT_CLINIC_OR_DEPARTMENT_OTHER)
Admission: EM | Admit: 2012-12-26 | Discharge: 2012-12-26 | Disposition: A | Discharge status: Home / Self Care | Payer: Medicare Other | Attending: Emergency Medicine | Admitting: Emergency Medicine

## 2012-12-26 ENCOUNTER — Other Ambulatory Visit: Payer: Self-pay

## 2012-12-26 ENCOUNTER — Emergency Department (HOSPITAL_BASED_OUTPATIENT_CLINIC_OR_DEPARTMENT_OTHER): Payer: Medicare Other

## 2012-12-26 DIAGNOSIS — R109 Unspecified abdominal pain: Secondary | ICD-10-CM | POA: Diagnosis not present

## 2012-12-26 DIAGNOSIS — J029 Acute pharyngitis, unspecified: Secondary | ICD-10-CM | POA: Diagnosis not present

## 2012-12-26 DIAGNOSIS — F411 Generalized anxiety disorder: Secondary | ICD-10-CM | POA: Diagnosis not present

## 2012-12-26 DIAGNOSIS — G4730 Sleep apnea, unspecified: Secondary | ICD-10-CM | POA: Insufficient documentation

## 2012-12-26 DIAGNOSIS — K219 Gastro-esophageal reflux disease without esophagitis: Secondary | ICD-10-CM | POA: Diagnosis not present

## 2012-12-26 DIAGNOSIS — Z9861 Coronary angioplasty status: Secondary | ICD-10-CM | POA: Diagnosis not present

## 2012-12-26 DIAGNOSIS — R002 Palpitations: Secondary | ICD-10-CM | POA: Insufficient documentation

## 2012-12-26 DIAGNOSIS — Z8544 Personal history of malignant neoplasm of other female genital organs: Secondary | ICD-10-CM | POA: Diagnosis not present

## 2012-12-26 DIAGNOSIS — Z87891 Personal history of nicotine dependence: Secondary | ICD-10-CM | POA: Diagnosis not present

## 2012-12-26 DIAGNOSIS — Z79899 Other long term (current) drug therapy: Secondary | ICD-10-CM | POA: Diagnosis not present

## 2012-12-26 DIAGNOSIS — Z8709 Personal history of other diseases of the respiratory system: Secondary | ICD-10-CM | POA: Diagnosis not present

## 2012-12-26 DIAGNOSIS — Z8639 Personal history of other endocrine, nutritional and metabolic disease: Secondary | ICD-10-CM | POA: Insufficient documentation

## 2012-12-26 DIAGNOSIS — G40909 Epilepsy, unspecified, not intractable, without status epilepticus: Secondary | ICD-10-CM | POA: Insufficient documentation

## 2012-12-26 DIAGNOSIS — Z8739 Personal history of other diseases of the musculoskeletal system and connective tissue: Secondary | ICD-10-CM | POA: Insufficient documentation

## 2012-12-26 DIAGNOSIS — Z8679 Personal history of other diseases of the circulatory system: Secondary | ICD-10-CM | POA: Diagnosis not present

## 2012-12-26 DIAGNOSIS — Z880 Allergy status to penicillin: Secondary | ICD-10-CM | POA: Insufficient documentation

## 2012-12-26 DIAGNOSIS — Z862 Personal history of diseases of the blood and blood-forming organs and certain disorders involving the immune mechanism: Secondary | ICD-10-CM | POA: Insufficient documentation

## 2012-12-26 DIAGNOSIS — J45909 Unspecified asthma, uncomplicated: Secondary | ICD-10-CM | POA: Diagnosis not present

## 2012-12-26 DIAGNOSIS — R059 Cough, unspecified: Secondary | ICD-10-CM

## 2012-12-26 DIAGNOSIS — Z9851 Tubal ligation status: Secondary | ICD-10-CM | POA: Insufficient documentation

## 2012-12-26 DIAGNOSIS — I10 Essential (primary) hypertension: Secondary | ICD-10-CM | POA: Diagnosis not present

## 2012-12-26 DIAGNOSIS — Z8659 Personal history of other mental and behavioral disorders: Secondary | ICD-10-CM | POA: Diagnosis not present

## 2012-12-26 DIAGNOSIS — Z9089 Acquired absence of other organs: Secondary | ICD-10-CM | POA: Diagnosis not present

## 2012-12-26 DIAGNOSIS — R05 Cough: Secondary | ICD-10-CM | POA: Diagnosis not present

## 2012-12-26 DIAGNOSIS — R110 Nausea: Secondary | ICD-10-CM | POA: Diagnosis not present

## 2012-12-26 LAB — CBC WITH DIFFERENTIAL/PLATELET
Basophils Absolute: 0 10*3/uL (ref 0.0–0.1)
Basophils Relative: 1 % (ref 0–1)
Eosinophils Absolute: 0.2 10*3/uL (ref 0.0–0.7)
Eosinophils Relative: 3 % (ref 0–5)
HCT: 39.9 % (ref 36.0–46.0)
Hemoglobin: 13.6 g/dL (ref 12.0–15.0)
Lymphocytes Relative: 23 % (ref 12–46)
Lymphs Abs: 1.3 10*3/uL (ref 0.7–4.0)
MCH: 29.9 pg (ref 26.0–34.0)
MCHC: 34.1 g/dL (ref 30.0–36.0)
MCV: 87.7 fL (ref 78.0–100.0)
Monocytes Absolute: 0.5 10*3/uL (ref 0.1–1.0)
Monocytes Relative: 9 % (ref 3–12)
Neutro Abs: 3.6 10*3/uL (ref 1.7–7.7)
Neutrophils Relative %: 64 % (ref 43–77)
Platelets: 260 10*3/uL (ref 150–400)
RBC: 4.55 MIL/uL (ref 3.87–5.11)
RDW: 14.3 % (ref 11.5–15.5)
WBC: 5.6 10*3/uL (ref 4.0–10.5)

## 2012-12-26 LAB — COMPLETE METABOLIC PANEL WITH GFR
ALT: 17 U/L (ref 0–35)
AST: 13 U/L (ref 0–37)
Albumin: 4.2 g/dL (ref 3.5–5.2)
Alkaline Phosphatase: 51 U/L (ref 39–117)
BUN: 11 mg/dL (ref 6–23)
CO2: 22 mEq/L (ref 19–32)
Calcium: 9.4 mg/dL (ref 8.4–10.5)
Chloride: 106 mEq/L (ref 96–112)
Creat: 0.51 mg/dL (ref 0.50–1.10)
GFR, Est African American: >89 mL/min
GFR, Est Non African American: >89 mL/min
Glucose, Bld: 86 mg/dL (ref 70–99)
Potassium: 3.9 mEq/L (ref 3.5–5.3)
Sodium: 140 mEq/L (ref 135–145)
Total Bilirubin: 0.3 mg/dL (ref 0.3–1.2)
Total Protein: 6.7 g/dL (ref 6.0–8.3)

## 2012-12-26 LAB — FERRITIN: Ferritin: 127 ng/mL (ref 10–291)

## 2012-12-26 LAB — IRON: Iron: 44 ug/dL (ref 42–145)

## 2012-12-26 MED ORDER — LEVALBUTEROL HCL 0.63 MG/3ML IN NEBU
0.6300 mg | INHALATION_SOLUTION | Freq: Once | RESPIRATORY_TRACT | Status: AC
  Administered 2012-12-26: 0.63 mg via RESPIRATORY_TRACT

## 2012-12-26 MED ORDER — LEVALBUTEROL HCL 0.63 MG/3ML IN NEBU
INHALATION_SOLUTION | RESPIRATORY_TRACT | Status: AC
  Filled 2012-12-26: qty 3
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
## 2012-12-26 NOTE — ED Notes (Signed)
Patient transported to X-ray 

## 2012-12-26 NOTE — ED Notes (Signed)
Vrinda Pickering, FNP at bedside 

## 2012-12-26 NOTE — ED Provider Notes (Signed)
ECG shows normal sinus rhythm with a rate of 81, no ectopy. Normal axis. Normal P wave. Normal QRS. Normal intervals. Normal ST and T waves. Impression: normal ECG. When compared with ECG of 12/01/2012, no significant changes are seen  Medical screening examination/treatment/procedure(s) were performed by non-physician practitioner and as supervising physician I was immediately available for consultation/collaboration.    Dione Booze, MD 12/26/12 [phone]

## 2012-12-26 NOTE — ED Notes (Signed)
Per pt has been sick for 2 weeks has seen dr Glade Lloyd several times had strept screen and ekg as well as lab work. States her abdomen is cramping now states she is concerned because when she coughs she is "bringing something up" and she is " sweating" and " I just dont sweat so I know something is wrong with me" " I also have ulcers in my mouth"

## 2012-12-27 DIAGNOSIS — J029 Acute pharyngitis, unspecified: Secondary | ICD-10-CM | POA: Diagnosis not present

## 2012-12-27 DIAGNOSIS — R0602 Shortness of breath: Secondary | ICD-10-CM | POA: Diagnosis not present

## 2012-12-27 DIAGNOSIS — I10 Essential (primary) hypertension: Secondary | ICD-10-CM | POA: Diagnosis not present

## 2012-12-28 DIAGNOSIS — R110 Nausea: Secondary | ICD-10-CM | POA: Diagnosis not present

## 2012-12-28 DIAGNOSIS — E669 Obesity, unspecified: Secondary | ICD-10-CM | POA: Diagnosis not present

## 2012-12-28 DIAGNOSIS — IMO0002 Reserved for concepts with insufficient information to code with codable children: Secondary | ICD-10-CM | POA: Diagnosis not present

## 2012-12-28 DIAGNOSIS — M549 Dorsalgia, unspecified: Secondary | ICD-10-CM | POA: Diagnosis not present

## 2012-12-29 DIAGNOSIS — I10 Essential (primary) hypertension: Secondary | ICD-10-CM | POA: Diagnosis not present

## 2012-12-29 DIAGNOSIS — Z79899 Other long term (current) drug therapy: Secondary | ICD-10-CM | POA: Diagnosis not present

## 2012-12-29 DIAGNOSIS — E269 Hyperaldosteronism, unspecified: Secondary | ICD-10-CM | POA: Diagnosis not present

## 2012-12-29 DIAGNOSIS — IMO0002 Reserved for concepts with insufficient information to code with codable children: Secondary | ICD-10-CM | POA: Diagnosis not present

## 2012-12-29 DIAGNOSIS — Z884 Allergy status to anesthetic agent status: Secondary | ICD-10-CM | POA: Diagnosis not present

## 2012-12-29 DIAGNOSIS — Z886 Allergy status to analgesic agent status: Secondary | ICD-10-CM | POA: Diagnosis not present

## 2012-12-29 DIAGNOSIS — J45909 Unspecified asthma, uncomplicated: Secondary | ICD-10-CM | POA: Diagnosis not present

## 2012-12-29 DIAGNOSIS — E785 Hyperlipidemia, unspecified: Secondary | ICD-10-CM | POA: Diagnosis not present

## 2012-12-29 DIAGNOSIS — Z880 Allergy status to penicillin: Secondary | ICD-10-CM | POA: Diagnosis not present

## 2012-12-29 DIAGNOSIS — M545 Low back pain, unspecified: Secondary | ICD-10-CM | POA: Diagnosis not present

## 2012-12-29 DIAGNOSIS — M889 Osteitis deformans of unspecified bone: Secondary | ICD-10-CM | POA: Diagnosis not present

## 2012-12-29 DIAGNOSIS — M4120 Other idiopathic scoliosis, site unspecified: Secondary | ICD-10-CM | POA: Diagnosis not present

## 2012-12-29 DIAGNOSIS — R52 Pain, unspecified: Secondary | ICD-10-CM | POA: Diagnosis not present

## 2012-12-29 DIAGNOSIS — Z9861 Coronary angioplasty status: Secondary | ICD-10-CM | POA: Diagnosis not present

## 2012-12-29 DIAGNOSIS — Z888 Allergy status to other drugs, medicaments and biological substances status: Secondary | ICD-10-CM | POA: Diagnosis not present

## 2012-12-29 DIAGNOSIS — Z881 Allergy status to other antibiotic agents status: Secondary | ICD-10-CM | POA: Diagnosis not present

## 2012-12-29 DIAGNOSIS — K219 Gastro-esophageal reflux disease without esophagitis: Secondary | ICD-10-CM | POA: Diagnosis not present

## 2012-12-30 ENCOUNTER — Ambulatory Visit: Payer: Medicare Other | Admitting: Family Medicine

## 2012-12-30 ENCOUNTER — Encounter: Payer: Self-pay | Admitting: *Deleted

## 2012-12-30 ENCOUNTER — Ambulatory Visit: Payer: Medicare Other | Admitting: Sports Medicine

## 2012-12-30 DIAGNOSIS — R6889 Other general symptoms and signs: Secondary | ICD-10-CM | POA: Diagnosis not present

## 2012-12-30 DIAGNOSIS — Z862 Personal history of diseases of the blood and blood-forming organs and certain disorders involving the immune mechanism: Secondary | ICD-10-CM | POA: Diagnosis not present

## 2012-12-30 DIAGNOSIS — R079 Chest pain, unspecified: Secondary | ICD-10-CM | POA: Diagnosis not present

## 2012-12-30 DIAGNOSIS — Z79899 Other long term (current) drug therapy: Secondary | ICD-10-CM | POA: Diagnosis not present

## 2012-12-30 DIAGNOSIS — Z885 Allergy status to narcotic agent status: Secondary | ICD-10-CM | POA: Diagnosis not present

## 2012-12-30 DIAGNOSIS — R0602 Shortness of breath: Secondary | ICD-10-CM | POA: Diagnosis not present

## 2012-12-30 DIAGNOSIS — F339 Major depressive disorder, recurrent, unspecified: Secondary | ICD-10-CM | POA: Diagnosis not present

## 2012-12-30 DIAGNOSIS — R0989 Other specified symptoms and signs involving the circulatory and respiratory systems: Secondary | ICD-10-CM | POA: Diagnosis not present

## 2012-12-30 DIAGNOSIS — R7309 Other abnormal glucose: Secondary | ICD-10-CM | POA: Diagnosis not present

## 2012-12-30 DIAGNOSIS — Z888 Allergy status to other drugs, medicaments and biological substances status: Secondary | ICD-10-CM | POA: Diagnosis not present

## 2012-12-30 DIAGNOSIS — I10 Essential (primary) hypertension: Secondary | ICD-10-CM | POA: Diagnosis not present

## 2012-12-30 DIAGNOSIS — J45909 Unspecified asthma, uncomplicated: Secondary | ICD-10-CM | POA: Diagnosis not present

## 2012-12-30 DIAGNOSIS — R911 Solitary pulmonary nodule: Secondary | ICD-10-CM | POA: Diagnosis not present

## 2012-12-30 DIAGNOSIS — Z882 Allergy status to sulfonamides status: Secondary | ICD-10-CM | POA: Diagnosis not present

## 2012-12-30 DIAGNOSIS — R0789 Other chest pain: Secondary | ICD-10-CM | POA: Diagnosis not present

## 2012-12-30 DIAGNOSIS — R0609 Other forms of dyspnea: Secondary | ICD-10-CM | POA: Diagnosis not present

## 2012-12-30 IMAGING — CT CT HEAD W/O CM
1 series · 16 of 30 positions shown, 20 images · non-contrast
Comparison: 09/17/2011

CLINICAL DATA: 46-year-old female with headache, double vision and
right-sided facial numbness.

CT HEAD WITHOUT CONTRAST
TECHNIQUE: Contiguous axial images were obtained from the base of
the skull through the vertex without contrast.

[Series 2: head 4.8 h37s · axial · 0.38mm/px · z∈[-171,-15]mm · 16 of 36 slices shown, 20 images]
[im 2/36  brain]
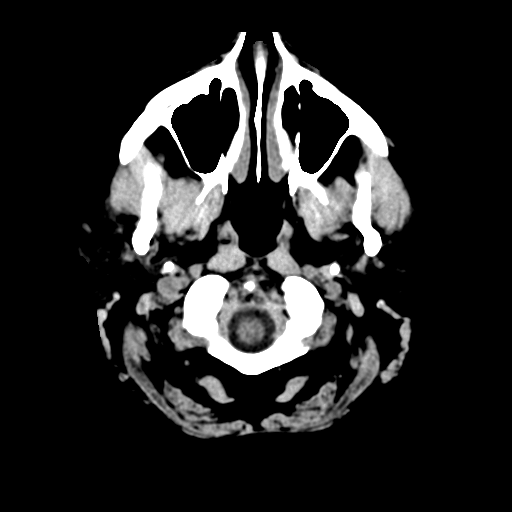
[im 2/36  bone]
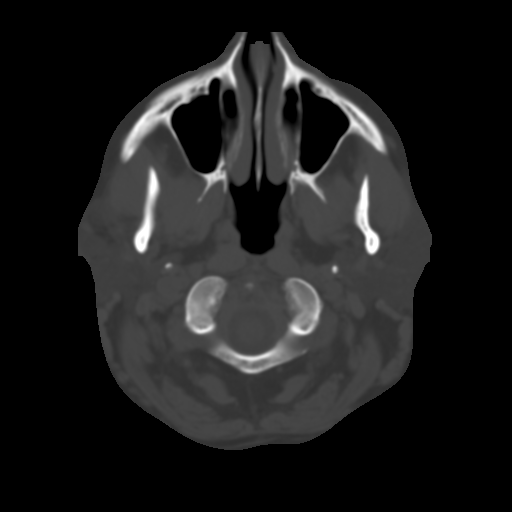
[im 4/36  brain]
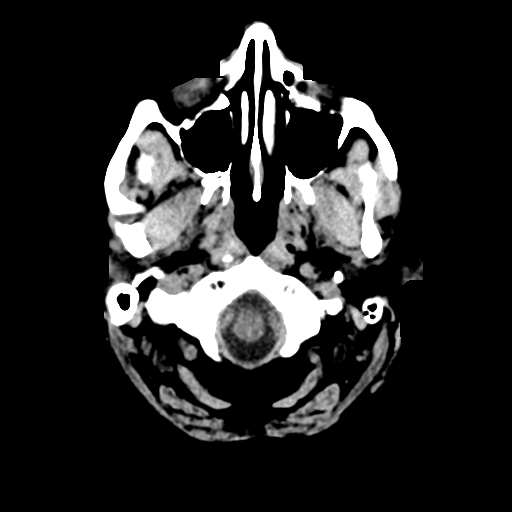
[im 7/36  brain]
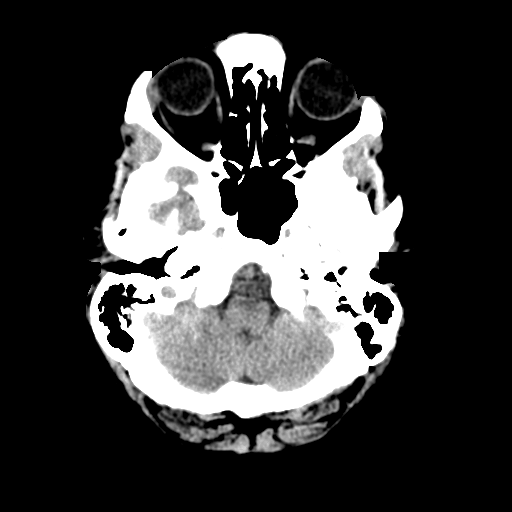
[im 9/36  brain]
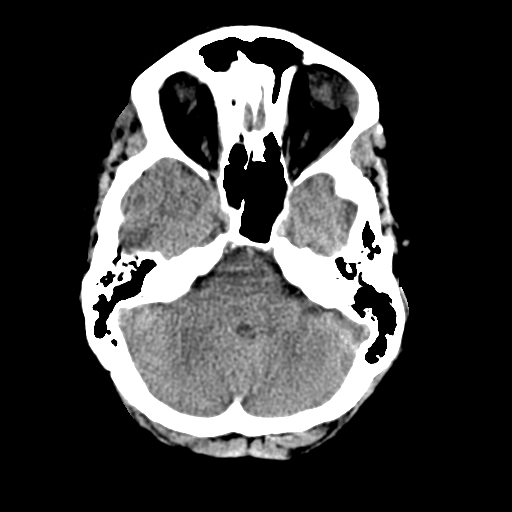
[im 10/36  brain]
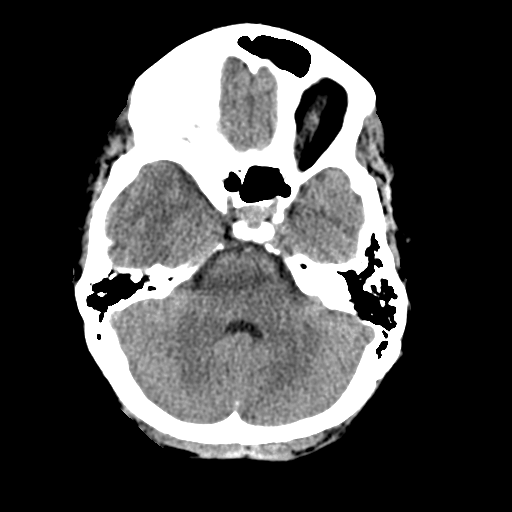
[im 10/36  bone]
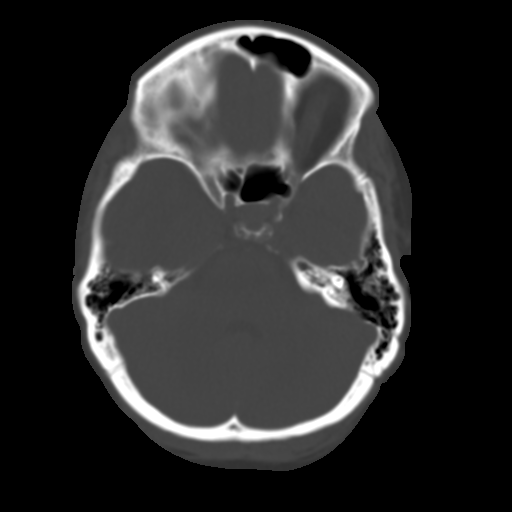
[im 13/36  brain]
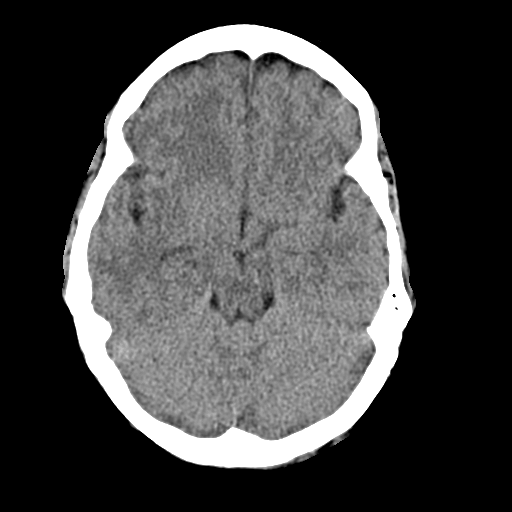
[im 15/36  brain]
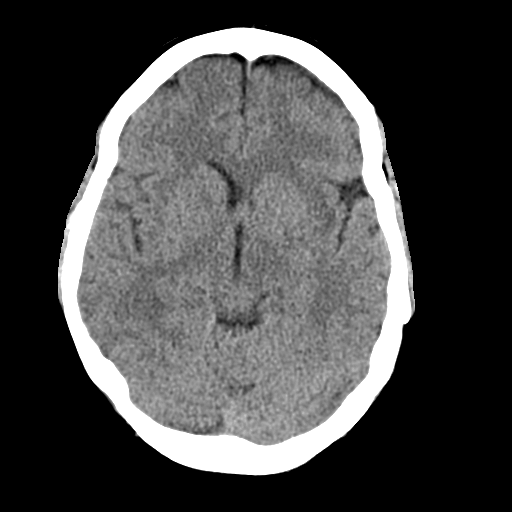
[im 17/36  brain]
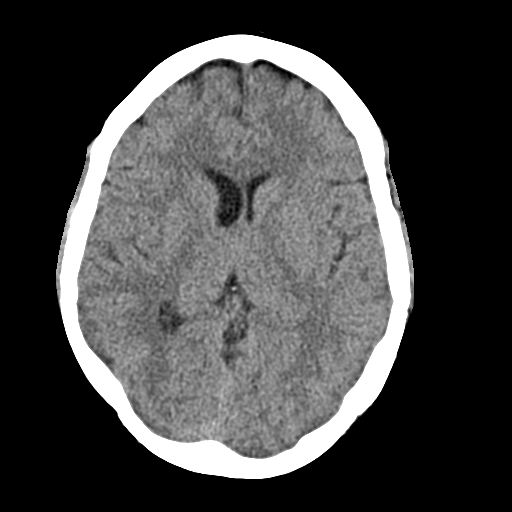
[im 19/36  brain]
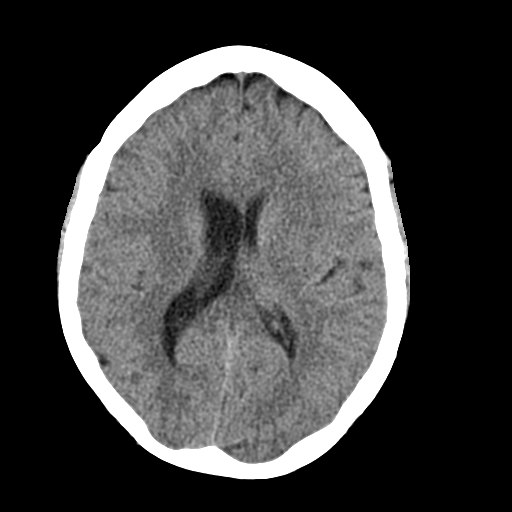
[im 19/36  bone]
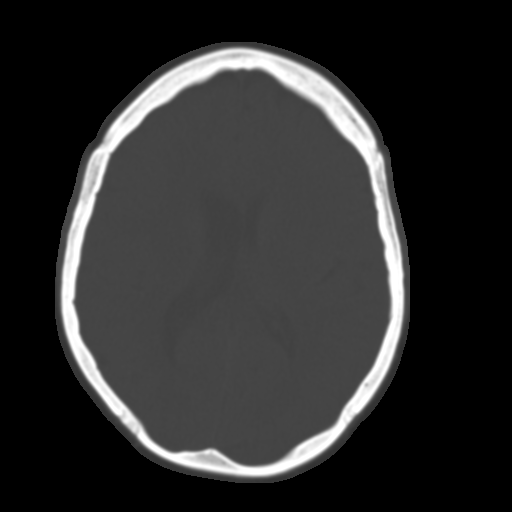
[im 21/36  brain]
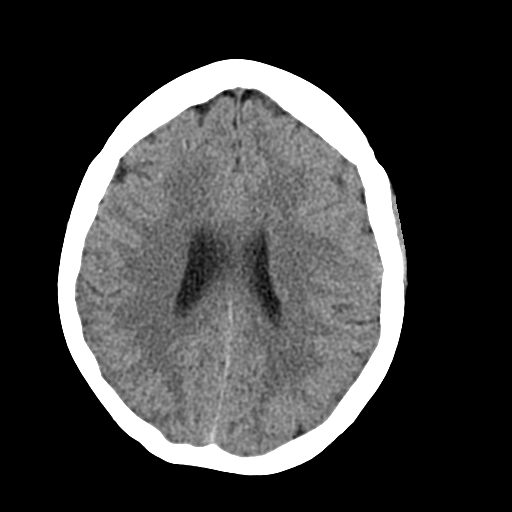
[im 23/36  brain]
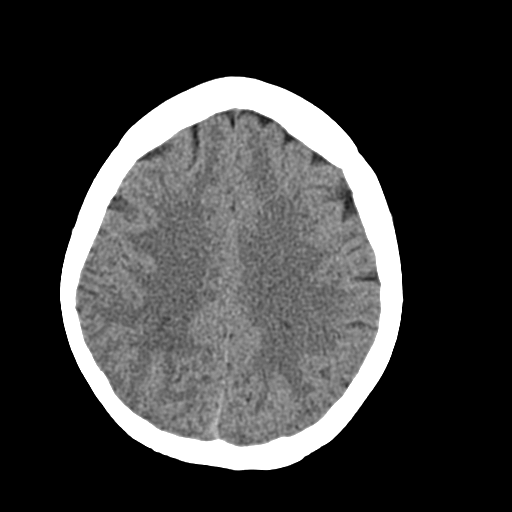
[im 26/36  brain]
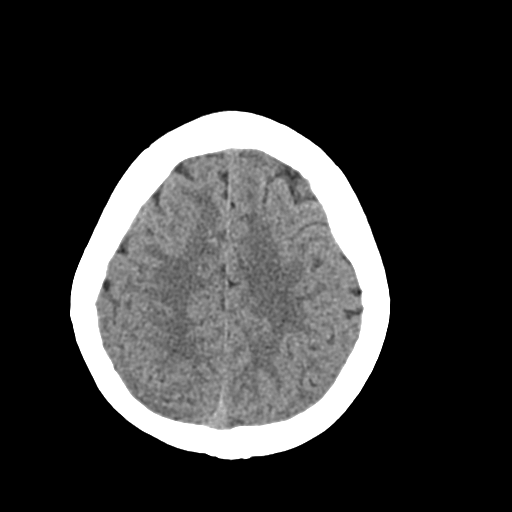
[im 27/36  brain]
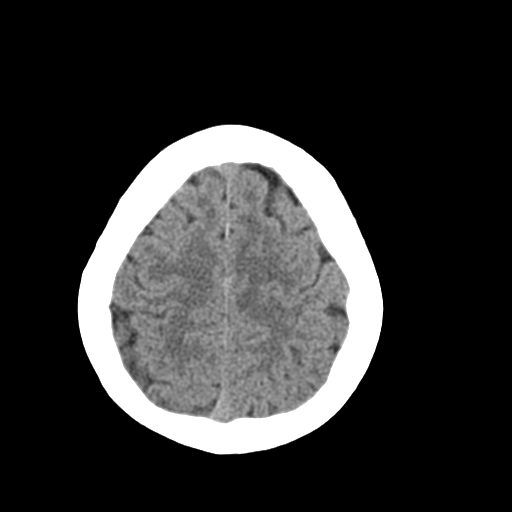
[im 27/36  bone]
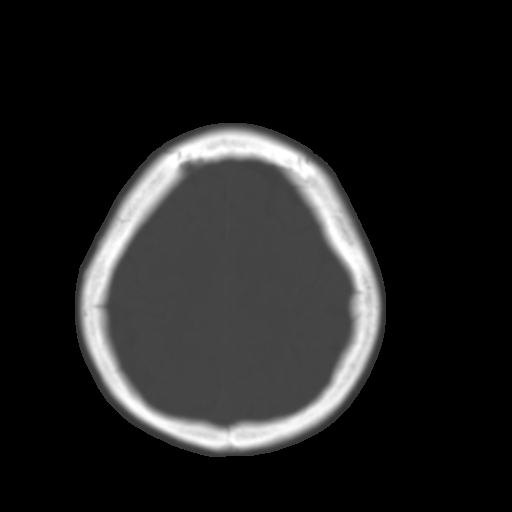
[im 29/36  brain]
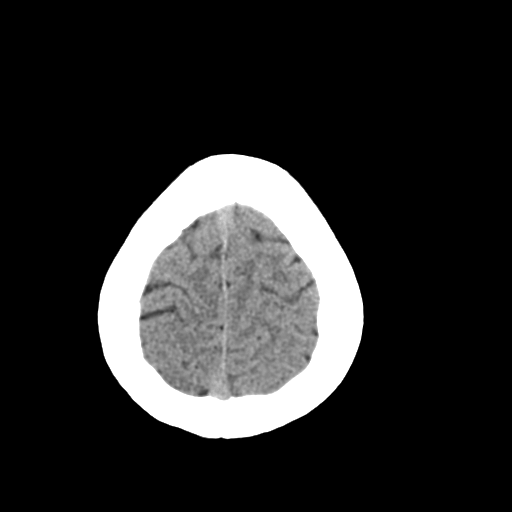
[im 32/36  brain]
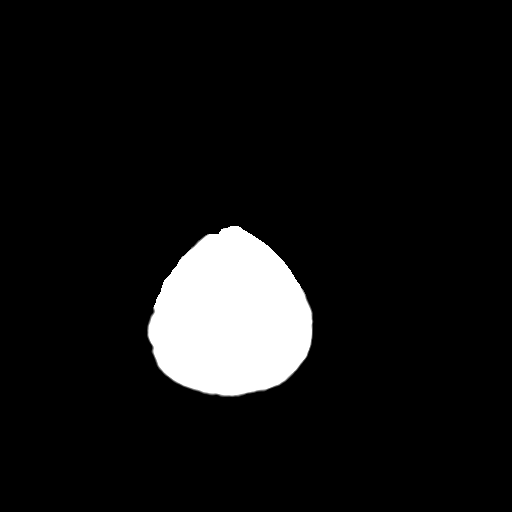
[im 34/36  brain]
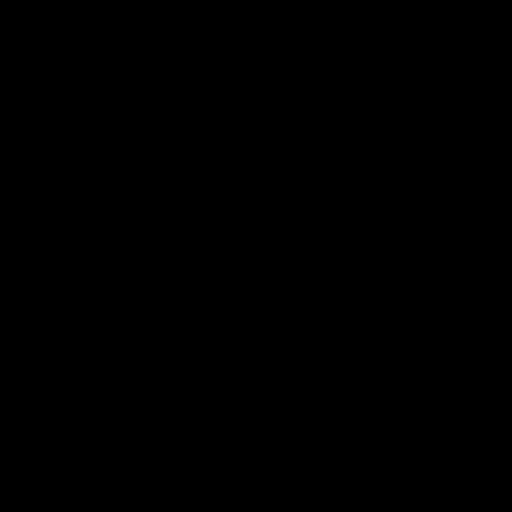

[16 of 30 positions shown; findings below may reference images not displayed]

FINDINGS: No intracranial abnormalities are identified, including
mass lesion or mass effect, hydrocephalus, extra-axial fluid
collection, midline shift, hemorrhage, or acute infarction.

The visualized bony calvarium is unremarkable.
IMPRESSION: Unremarkable noncontrast head CT

## 2012-12-31 ENCOUNTER — Telehealth: Payer: Self-pay | Admitting: *Deleted

## 2012-12-31 DIAGNOSIS — R7309 Other abnormal glucose: Secondary | ICD-10-CM | POA: Diagnosis not present

## 2012-12-31 DIAGNOSIS — I10 Essential (primary) hypertension: Secondary | ICD-10-CM | POA: Diagnosis not present

## 2012-12-31 DIAGNOSIS — R6889 Other general symptoms and signs: Secondary | ICD-10-CM | POA: Diagnosis not present

## 2012-12-31 DIAGNOSIS — R0602 Shortness of breath: Secondary | ICD-10-CM | POA: Diagnosis not present

## 2012-12-31 DIAGNOSIS — R079 Chest pain, unspecified: Secondary | ICD-10-CM | POA: Diagnosis not present

## 2012-12-31 DIAGNOSIS — R0789 Other chest pain: Secondary | ICD-10-CM

## 2012-12-31 DIAGNOSIS — R61 Generalized hyperhidrosis: Secondary | ICD-10-CM | POA: Diagnosis not present

## 2012-12-31 DIAGNOSIS — R0989 Other specified symptoms and signs involving the circulatory and respiratory systems: Secondary | ICD-10-CM | POA: Diagnosis not present

## 2012-12-31 DIAGNOSIS — R0609 Other forms of dyspnea: Secondary | ICD-10-CM | POA: Diagnosis not present

## 2012-12-31 NOTE — Telephone Encounter (Signed)
Called pt back told her that Dr. Linford Arnold has placed order for D dimer to be drawn.tried to find other labs that were open to help to pt however, I could not find anything to accommodate her needs and told her that she would have to wait until tomorrow morning to go have this done. I told her to try and relax and drink plenty of fluids and try not to allow herself to get worked up. If she began to feel worse than what she is currently feeling she should go directly to the ER. Pt voiced understanding and agreed.Loralee Pacas [place]

## 2012-12-31 NOTE — Telephone Encounter (Signed)
Pt called and stated that she was told to have a d-dimer drawn. She had troponin done 0.010. She has an appt on Thurs. Asked pt how she felt over the weekend and she stated that she felt ok, however she started to feel worse yesterday around lunch time. I asked if she had been drinking more fluids she stated that she has drank as much as she possibly could. She still feels that the blood pressure meds are not helping. She would like to know if Dr. Linford Arnold will order any additional labs for her to have done.  Laureen Ochs, Viann Shove

## 2013-01-01 ENCOUNTER — Ambulatory Visit: Payer: Medicare Other | Admitting: Family Medicine

## 2013-01-01 DIAGNOSIS — F339 Major depressive disorder, recurrent, unspecified: Secondary | ICD-10-CM | POA: Diagnosis not present

## 2013-01-01 LAB — D-DIMER, QUANTITATIVE: D-Dimer, Quant: 0.27 ug/mL-FEU (ref 0.00–0.48)

## 2013-01-02 ENCOUNTER — Encounter: Payer: Self-pay | Admitting: Family Medicine

## 2013-01-02 ENCOUNTER — Ambulatory Visit (INDEPENDENT_AMBULATORY_CARE_PROVIDER_SITE_OTHER): Payer: Medicare Other | Admitting: Family Medicine

## 2013-01-02 VITALS — BP 126/86 | HR 100 | Wt 184.0 lb

## 2013-01-02 DIAGNOSIS — I10 Essential (primary) hypertension: Secondary | ICD-10-CM | POA: Diagnosis not present

## 2013-01-02 DIAGNOSIS — R7301 Impaired fasting glucose: Secondary | ICD-10-CM

## 2013-01-02 DIAGNOSIS — R1310 Dysphagia, unspecified: Secondary | ICD-10-CM

## 2013-01-02 DIAGNOSIS — K219 Gastro-esophageal reflux disease without esophagitis: Secondary | ICD-10-CM | POA: Diagnosis not present

## 2013-01-02 NOTE — Progress Notes (Signed)
Subjective:    Patient ID: Jennifer [name], female    DOB: 1966-03-28, 47 y.o.   MRN: [id number]  HPI  Still feels like a knot in the back of her tongue and feels like ti has bothered her throat.  She says it feels similar to when she would get debris in her cryptic tonsils but says she hasn't really been able to see anything in her tonsils this time. It has been going on for months and maybe even over 6 months at this point in time. She says it just feels like there something stuck back at the very edge of her tongue. Has been back to ENT 3 times in the last month. Her previous ENT had had Botox injections which were helpful for her throat. Unfortunately she left that clinic and moved away. The current ENT who has taken her place is not wanting to do the Botox because he is fearful that she will aspirate. Says has been trying to drink more fluids.  Says ate a burrito and then goes a large amount of water to try to work on her hydration and then regurgitated some. Then took some Mylanta. Still feels like a tickle in her throat.  Says still feels like ti si hard to breath in her throat area ( not her lungs). Says this is not her lungs.  Felt like the Micardis was causing some flopping in her chest and discomfort.  Says does feel better since stopped it. Mylanta did help her sxs as well. Marland Kitchen   Has had botox injection. Hx of barretts.  Biggest fear is she is going to choke and aspirate.  Says doesn't tolerate PPI.  Has been belching a lot too. She thinks her last lung study was about a year ago.  Review of Systems     Objective:   Physical Exam  Constitutional: She is oriented to person, place, and time. She appears well-developed and well-nourished.  HENT:  Head: Normocephalic and atraumatic.  Mouth/Throat: Oropharynx is clear and moist.  I was unable to palpate any nodules at the posterior right tongue today.  Cardiovascular: Normal rate, regular rhythm and normal heart sounds.   Pulmonary/Chest:  Effort normal and breath sounds normal.  Neurological: She is alert and oriented to person, place, and time.  Skin: Skin is warm and dry.  Psychiatric: She has a normal mood and affect. Her behavior is normal.          Assessment & Plan:  HTN - BP is ok today off of BP mesd but her BP goes up and down.  I discussed that the most important part of controlling blood pressure is bringing down her overall range of pressures. I tried to explain to her that it would not bring her down to one specific number and keep her there that she will still have normal fluctuations in blood pressure. A repeat things that can often times triggered blood pressure changes. While we are trying to do by lowering her blood pressure is shifting that range a little lower by 5-10 points so that the majority of the time her blood pressure is under 140 and is well-controlled. 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 She has had elevated blood pressures before starting the medication which is why we discussed even starting it to begin with.  GERD - Discussed eating small bites and chew really  thoroughly and to restarting her liquid zantac.  Make sure eating more often to compensate for having to eat smaller meals. I explained her that there is nothing we can do about her dysmotility to fully to work were correct the condition. The most important thing is as above eating small amounts, chewing very thoroughly and taking her time and not counting lots of fluid after she eats which will increase her risk of her card to taking. Also taking Zantac which helps with reflux should help though I agree it is not curative. She has been intolerant to PPIs I think sticking with an H2 blocker is perfect.. She has had a previous history of dysmotility. There is nothing I can do about helping to get her Botox injections. She might  want to consider looking elsewhere such as [facility] or 2.  IFG - discussed dx.  she is just borderline for impaired fasting glucose. Continue to watch portion sizes and carbohydrates. It sounds that she does get a fair amount of these in her diet. Lab Results  Component Value Date   HGBA1C 5.7 11/14/2012   Time spent 35 minutes, greater than 50% of the time counseling about her reflux, blood pressure and impaired fasting glucose.

## 2013-01-06 ENCOUNTER — Emergency Department (HOSPITAL_BASED_OUTPATIENT_CLINIC_OR_DEPARTMENT_OTHER)
Admission: EM | Admit: 2013-01-06 | Discharge: 2013-01-06 | Disposition: A | Discharge status: Home / Self Care | Payer: Medicare Other | Attending: Emergency Medicine | Admitting: Emergency Medicine

## 2013-01-06 ENCOUNTER — Emergency Department (HOSPITAL_BASED_OUTPATIENT_CLINIC_OR_DEPARTMENT_OTHER): Payer: Medicare Other

## 2013-01-06 ENCOUNTER — Encounter (HOSPITAL_BASED_OUTPATIENT_CLINIC_OR_DEPARTMENT_OTHER): Payer: Self-pay | Admitting: *Deleted

## 2013-01-06 DIAGNOSIS — R0989 Other specified symptoms and signs involving the circulatory and respiratory systems: Secondary | ICD-10-CM | POA: Diagnosis not present

## 2013-01-06 DIAGNOSIS — R059 Cough, unspecified: Secondary | ICD-10-CM | POA: Diagnosis not present

## 2013-01-06 DIAGNOSIS — E785 Hyperlipidemia, unspecified: Secondary | ICD-10-CM | POA: Diagnosis not present

## 2013-01-06 DIAGNOSIS — J029 Acute pharyngitis, unspecified: Secondary | ICD-10-CM | POA: Diagnosis not present

## 2013-01-06 DIAGNOSIS — G8929 Other chronic pain: Secondary | ICD-10-CM | POA: Insufficient documentation

## 2013-01-06 DIAGNOSIS — G4730 Sleep apnea, unspecified: Secondary | ICD-10-CM | POA: Insufficient documentation

## 2013-01-06 DIAGNOSIS — R1310 Dysphagia, unspecified: Secondary | ICD-10-CM

## 2013-01-06 DIAGNOSIS — K219 Gastro-esophageal reflux disease without esophagitis: Secondary | ICD-10-CM | POA: Insufficient documentation

## 2013-01-06 DIAGNOSIS — Z87891 Personal history of nicotine dependence: Secondary | ICD-10-CM | POA: Insufficient documentation

## 2013-01-06 DIAGNOSIS — E559 Vitamin D deficiency, unspecified: Secondary | ICD-10-CM | POA: Diagnosis not present

## 2013-01-06 DIAGNOSIS — R51 Headache: Secondary | ICD-10-CM | POA: Insufficient documentation

## 2013-01-06 DIAGNOSIS — I10 Essential (primary) hypertension: Secondary | ICD-10-CM | POA: Insufficient documentation

## 2013-01-06 DIAGNOSIS — F4310 Post-traumatic stress disorder, unspecified: Secondary | ICD-10-CM | POA: Diagnosis not present

## 2013-01-06 DIAGNOSIS — J069 Acute upper respiratory infection, unspecified: Secondary | ICD-10-CM | POA: Diagnosis not present

## 2013-01-06 DIAGNOSIS — J45909 Unspecified asthma, uncomplicated: Secondary | ICD-10-CM | POA: Diagnosis not present

## 2013-01-06 DIAGNOSIS — Z880 Allergy status to penicillin: Secondary | ICD-10-CM | POA: Insufficient documentation

## 2013-01-06 DIAGNOSIS — Z8669 Personal history of other diseases of the nervous system and sense organs: Secondary | ICD-10-CM | POA: Insufficient documentation

## 2013-01-06 DIAGNOSIS — R0602 Shortness of breath: Secondary | ICD-10-CM | POA: Diagnosis not present

## 2013-01-06 DIAGNOSIS — F458 Other somatoform disorders: Secondary | ICD-10-CM

## 2013-01-06 DIAGNOSIS — Z79899 Other long term (current) drug therapy: Secondary | ICD-10-CM | POA: Diagnosis not present

## 2013-01-06 DIAGNOSIS — R071 Chest pain on breathing: Secondary | ICD-10-CM | POA: Diagnosis not present

## 2013-01-06 DIAGNOSIS — R0609 Other forms of dyspnea: Secondary | ICD-10-CM | POA: Diagnosis not present

## 2013-01-06 DIAGNOSIS — R1084 Generalized abdominal pain: Secondary | ICD-10-CM | POA: Diagnosis not present

## 2013-01-06 DIAGNOSIS — J392 Other diseases of pharynx: Secondary | ICD-10-CM | POA: Diagnosis not present

## 2013-01-06 DIAGNOSIS — F449 Dissociative and conversion disorder, unspecified: Secondary | ICD-10-CM | POA: Diagnosis not present

## 2013-01-06 DIAGNOSIS — I498 Other specified cardiac arrhythmias: Secondary | ICD-10-CM | POA: Insufficient documentation

## 2013-01-06 DIAGNOSIS — R05 Cough: Secondary | ICD-10-CM | POA: Diagnosis not present
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
## 2013-01-06 NOTE — ED Notes (Signed)
Patient speaking in long complete sentences, no Resp Distress noted.  No stridor over throat.

## 2013-01-06 NOTE — ED Notes (Signed)
Patient states she has had pain in her throat for the last several weeks.  States the pain has gotten worse over the last few days.  C/O sinus congestion and non productive cough.  States she was at [facility] this morning and was treated for reflux.  Past hx of botox injections in her larynx from specialist at [facility] 2.

## 2013-01-07 DIAGNOSIS — R1084 Generalized abdominal pain: Secondary | ICD-10-CM | POA: Diagnosis not present

## 2013-01-07 DIAGNOSIS — E785 Hyperlipidemia, unspecified: Secondary | ICD-10-CM | POA: Diagnosis not present

## 2013-01-07 DIAGNOSIS — K219 Gastro-esophageal reflux disease without esophagitis: Secondary | ICD-10-CM | POA: Diagnosis not present

## 2013-01-07 DIAGNOSIS — Z881 Allergy status to other antibiotic agents status: Secondary | ICD-10-CM | POA: Diagnosis not present

## 2013-01-07 DIAGNOSIS — R0989 Other specified symptoms and signs involving the circulatory and respiratory systems: Secondary | ICD-10-CM | POA: Diagnosis not present

## 2013-01-07 DIAGNOSIS — R059 Cough, unspecified: Secondary | ICD-10-CM | POA: Diagnosis not present

## 2013-01-07 DIAGNOSIS — J45909 Unspecified asthma, uncomplicated: Secondary | ICD-10-CM | POA: Diagnosis not present

## 2013-01-07 DIAGNOSIS — R071 Chest pain on breathing: Secondary | ICD-10-CM | POA: Diagnosis not present

## 2013-01-07 DIAGNOSIS — Z79899 Other long term (current) drug therapy: Secondary | ICD-10-CM | POA: Diagnosis not present

## 2013-01-07 DIAGNOSIS — I10 Essential (primary) hypertension: Secondary | ICD-10-CM | POA: Diagnosis not present

## 2013-01-07 DIAGNOSIS — Z888 Allergy status to other drugs, medicaments and biological substances status: Secondary | ICD-10-CM | POA: Diagnosis not present

## 2013-01-07 DIAGNOSIS — R0609 Other forms of dyspnea: Secondary | ICD-10-CM | POA: Diagnosis not present

## 2013-01-07 DIAGNOSIS — F339 Major depressive disorder, recurrent, unspecified: Secondary | ICD-10-CM | POA: Diagnosis not present

## 2013-01-07 DIAGNOSIS — Z880 Allergy status to penicillin: Secondary | ICD-10-CM | POA: Diagnosis not present

## 2013-01-07 DIAGNOSIS — R05 Cough: Secondary | ICD-10-CM | POA: Diagnosis not present

## 2013-01-09 ENCOUNTER — Encounter (HOSPITAL_BASED_OUTPATIENT_CLINIC_OR_DEPARTMENT_OTHER): Payer: Self-pay | Admitting: Family Medicine

## 2013-01-09 ENCOUNTER — Emergency Department (HOSPITAL_BASED_OUTPATIENT_CLINIC_OR_DEPARTMENT_OTHER)
Admission: EM | Admit: 2013-01-09 | Discharge: 2013-01-09 | Disposition: A | Discharge status: Home / Self Care | Payer: Medicare Other | Attending: Emergency Medicine | Admitting: Emergency Medicine

## 2013-01-09 DIAGNOSIS — Z880 Allergy status to penicillin: Secondary | ICD-10-CM | POA: Insufficient documentation

## 2013-01-09 DIAGNOSIS — I10 Essential (primary) hypertension: Secondary | ICD-10-CM | POA: Insufficient documentation

## 2013-01-09 DIAGNOSIS — Z8669 Personal history of other diseases of the nervous system and sense organs: Secondary | ICD-10-CM | POA: Diagnosis not present

## 2013-01-09 DIAGNOSIS — Z79899 Other long term (current) drug therapy: Secondary | ICD-10-CM | POA: Insufficient documentation

## 2013-01-09 DIAGNOSIS — R112 Nausea with vomiting, unspecified: Secondary | ICD-10-CM | POA: Insufficient documentation

## 2013-01-09 DIAGNOSIS — Z87891 Personal history of nicotine dependence: Secondary | ICD-10-CM | POA: Insufficient documentation

## 2013-01-09 DIAGNOSIS — J45909 Unspecified asthma, uncomplicated: Secondary | ICD-10-CM | POA: Insufficient documentation

## 2013-01-09 DIAGNOSIS — Z9861 Coronary angioplasty status: Secondary | ICD-10-CM | POA: Insufficient documentation

## 2013-01-09 DIAGNOSIS — Z8639 Personal history of other endocrine, nutritional and metabolic disease: Secondary | ICD-10-CM | POA: Diagnosis not present

## 2013-01-09 DIAGNOSIS — Z862 Personal history of diseases of the blood and blood-forming organs and certain disorders involving the immune mechanism: Secondary | ICD-10-CM | POA: Insufficient documentation

## 2013-01-09 DIAGNOSIS — R42 Dizziness and giddiness: Secondary | ICD-10-CM | POA: Diagnosis not present

## 2013-01-09 DIAGNOSIS — Z8679 Personal history of other diseases of the circulatory system: Secondary | ICD-10-CM | POA: Insufficient documentation

## 2013-01-09 DIAGNOSIS — K219 Gastro-esophageal reflux disease without esophagitis: Secondary | ICD-10-CM | POA: Insufficient documentation

## 2013-01-09 DIAGNOSIS — G4730 Sleep apnea, unspecified: Secondary | ICD-10-CM | POA: Insufficient documentation

## 2013-01-09 DIAGNOSIS — Z8659 Personal history of other mental and behavioral disorders: Secondary | ICD-10-CM | POA: Insufficient documentation

## 2013-01-09 DIAGNOSIS — Z85828 Personal history of other malignant neoplasm of skin: Secondary | ICD-10-CM | POA: Diagnosis not present

## 2013-01-09 LAB — URINALYSIS, ROUTINE W REFLEX MICROSCOPIC
Bilirubin Urine: NEGATIVE
Glucose, UA: NEGATIVE mg/dL
Hgb urine dipstick: NEGATIVE
Ketones, ur: NEGATIVE mg/dL
Leukocytes, UA: NEGATIVE
Nitrite: NEGATIVE
Protein, ur: NEGATIVE mg/dL
Specific Gravity, Urine: 1.024 (ref 1.005–1.030)
Urobilinogen, UA: 0.2 mg/dL (ref 0.0–1.0)
pH: 6 (ref 5.0–8.0)

## 2013-01-09 LAB — BASIC METABOLIC PANEL
BUN: 13 mg/dL (ref 6–23)
CO2: 24 mEq/L (ref 19–32)
Calcium: 9.4 mg/dL (ref 8.4–10.5)
Chloride: 103 mEq/L (ref 96–112)
Creatinine, Ser: 0.5 mg/dL (ref 0.50–1.10)
GFR calc Af Amer: >90 mL/min (ref >90)
GFR calc non Af Amer: >90 mL/min (ref >90)
Glucose, Bld: 109 mg/dL — ABNORMAL HIGH (ref 70–99)
Potassium: 3.8 mEq/L (ref 3.5–5.1)
Sodium: 139 mEq/L (ref 135–145)

## 2013-01-09 MED ORDER — SODIUM CHLORIDE 0.9 % IV BOLUS (SEPSIS): 1000.0000 mL | Freq: Once | INTRAVENOUS | Status: DC

## 2013-01-09 MED ORDER — ONDANSETRON 4 MG PO TBDP: 4.0000 mg | ORAL_TABLET | Freq: Three times a day (TID) | ORAL | Status: DC | PRN

## 2013-01-09 MED ORDER — ONDANSETRON HCL 4 MG/2ML IJ SOLN
4.0000 mg | Freq: Once | INTRAMUSCULAR | Status: AC
  Administered 2013-01-09: 4 mg via INTRAVENOUS
  Filled 2013-01-09: qty 2

## 2013-01-09 NOTE — ED Notes (Signed)
Pt states she is unhappy that she is being discharged, states "I know I am dehydrated, why doesn't he see that I'm dehydrated?" Lab results reviewed with pt, pt states "I don't know how the labs are normal, I know how I feel and how I've been feeling. Why can't anyone figure it out?!"

## 2013-01-09 NOTE — ED Notes (Addendum)
Pt c/o nausea and "feeling dehydrated". Pt sts she was vomiting earlier in the week but not past couple of days. Pt sts child has had gi virus.
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
## 2013-01-10 ENCOUNTER — Other Ambulatory Visit: Payer: Self-pay | Admitting: Physician Assistant

## 2013-01-10 ENCOUNTER — Ambulatory Visit
Admission: RE | Admit: 2013-01-10 | Discharge: 2013-01-10 | Disposition: A | Discharge status: Home / Self Care | Payer: Medicare Other | Source: Ambulatory Visit | Attending: Physician Assistant | Admitting: Physician Assistant

## 2013-01-10 DIAGNOSIS — R928 Other abnormal and inconclusive findings on diagnostic imaging of breast: Secondary | ICD-10-CM | POA: Diagnosis not present

## 2013-01-10 DIAGNOSIS — Z87898 Personal history of other specified conditions: Secondary | ICD-10-CM

## 2013-01-10 DIAGNOSIS — N644 Mastodynia: Secondary | ICD-10-CM

## 2013-01-10 NOTE — ED Provider Notes (Signed)
Medical screening examination/treatment/procedure(s) were performed by non-physician practitioner and as supervising physician I was immediately available for consultation/collaboration.   Charles B. Sheldon, MD 01/10/13 0000 

## 2013-01-13 DIAGNOSIS — S6000XA Contusion of unspecified finger without damage to nail, initial encounter: Secondary | ICD-10-CM | POA: Diagnosis not present

## 2013-01-13 DIAGNOSIS — S60229A Contusion of unspecified hand, initial encounter: Secondary | ICD-10-CM | POA: Diagnosis not present

## 2013-01-13 DIAGNOSIS — I10 Essential (primary) hypertension: Secondary | ICD-10-CM | POA: Diagnosis not present

## 2013-01-14 DIAGNOSIS — Z79899 Other long term (current) drug therapy: Secondary | ICD-10-CM | POA: Diagnosis not present

## 2013-01-14 DIAGNOSIS — R059 Cough, unspecified: Secondary | ICD-10-CM | POA: Diagnosis not present

## 2013-01-14 DIAGNOSIS — R5381 Other malaise: Secondary | ICD-10-CM | POA: Diagnosis not present

## 2013-01-14 DIAGNOSIS — R220 Localized swelling, mass and lump, head: Secondary | ICD-10-CM | POA: Diagnosis not present

## 2013-01-14 DIAGNOSIS — R000 Tachycardia, unspecified: Secondary | ICD-10-CM | POA: Diagnosis not present

## 2013-01-14 DIAGNOSIS — R42 Dizziness and giddiness: Secondary | ICD-10-CM | POA: Diagnosis not present

## 2013-01-14 DIAGNOSIS — R05 Cough: Secondary | ICD-10-CM | POA: Diagnosis not present

## 2013-01-14 DIAGNOSIS — Z888 Allergy status to other drugs, medicaments and biological substances status: Secondary | ICD-10-CM | POA: Diagnosis not present

## 2013-01-14 DIAGNOSIS — J45909 Unspecified asthma, uncomplicated: Secondary | ICD-10-CM | POA: Diagnosis not present

## 2013-01-14 DIAGNOSIS — R209 Unspecified disturbances of skin sensation: Secondary | ICD-10-CM | POA: Diagnosis not present

## 2013-01-14 DIAGNOSIS — R1110 Vomiting, unspecified: Secondary | ICD-10-CM | POA: Diagnosis not present

## 2013-01-14 DIAGNOSIS — R5383 Other fatigue: Secondary | ICD-10-CM | POA: Diagnosis not present

## 2013-01-14 DIAGNOSIS — R51 Headache: Secondary | ICD-10-CM | POA: Diagnosis not present

## 2013-01-15 ENCOUNTER — Telehealth: Payer: Self-pay | Admitting: *Deleted

## 2013-01-15 DIAGNOSIS — R000 Tachycardia, unspecified: Secondary | ICD-10-CM | POA: Diagnosis not present

## 2013-01-15 DIAGNOSIS — R6884 Jaw pain: Secondary | ICD-10-CM | POA: Diagnosis not present

## 2013-01-15 DIAGNOSIS — Z885 Allergy status to narcotic agent status: Secondary | ICD-10-CM | POA: Diagnosis not present

## 2013-01-15 DIAGNOSIS — M542 Cervicalgia: Secondary | ICD-10-CM | POA: Diagnosis not present

## 2013-01-15 DIAGNOSIS — R42 Dizziness and giddiness: Secondary | ICD-10-CM | POA: Diagnosis not present

## 2013-01-15 DIAGNOSIS — Z881 Allergy status to other antibiotic agents status: Secondary | ICD-10-CM | POA: Diagnosis not present

## 2013-01-15 DIAGNOSIS — R5381 Other malaise: Secondary | ICD-10-CM | POA: Diagnosis not present

## 2013-01-15 DIAGNOSIS — M889 Osteitis deformans of unspecified bone: Secondary | ICD-10-CM | POA: Diagnosis not present

## 2013-01-15 DIAGNOSIS — Z9109 Other allergy status, other than to drugs and biological substances: Secondary | ICD-10-CM | POA: Diagnosis not present

## 2013-01-15 DIAGNOSIS — R220 Localized swelling, mass and lump, head: Secondary | ICD-10-CM | POA: Diagnosis not present

## 2013-01-15 DIAGNOSIS — Z886 Allergy status to analgesic agent status: Secondary | ICD-10-CM | POA: Diagnosis not present

## 2013-01-15 DIAGNOSIS — K2270 Barrett's esophagus without dysplasia: Secondary | ICD-10-CM | POA: Diagnosis not present

## 2013-01-15 DIAGNOSIS — D509 Iron deficiency anemia, unspecified: Secondary | ICD-10-CM | POA: Diagnosis not present

## 2013-01-15 DIAGNOSIS — J45909 Unspecified asthma, uncomplicated: Secondary | ICD-10-CM | POA: Diagnosis not present

## 2013-01-15 DIAGNOSIS — R51 Headache: Secondary | ICD-10-CM | POA: Diagnosis not present

## 2013-01-15 DIAGNOSIS — R209 Unspecified disturbances of skin sensation: Secondary | ICD-10-CM | POA: Diagnosis not present

## 2013-01-15 DIAGNOSIS — R110 Nausea: Secondary | ICD-10-CM | POA: Diagnosis not present

## 2013-01-15 DIAGNOSIS — E559 Vitamin D deficiency, unspecified: Secondary | ICD-10-CM | POA: Diagnosis not present

## 2013-01-15 DIAGNOSIS — Z880 Allergy status to penicillin: Secondary | ICD-10-CM | POA: Diagnosis not present

## 2013-01-15 DIAGNOSIS — H53149 Visual discomfort, unspecified: Secondary | ICD-10-CM | POA: Diagnosis not present

## 2013-01-15 DIAGNOSIS — Z888 Allergy status to other drugs, medicaments and biological substances status: Secondary | ICD-10-CM | POA: Diagnosis not present

## 2013-01-15 DIAGNOSIS — E269 Hyperaldosteronism, unspecified: Secondary | ICD-10-CM | POA: Diagnosis not present

## 2013-01-15 DIAGNOSIS — I10 Essential (primary) hypertension: Secondary | ICD-10-CM | POA: Diagnosis not present

## 2013-01-15 IMAGING — US US EXTREM LOW VENOUS*R*
1 series · 14 of 24 positions shown · non-contrast
Comparison: 12/28/2010

CLINICAL DATA: Right leg pain

RIGHT THE LOWER EXTREMITY VENOUS DUPLEX ULTRASOUND
TECHNIQUE: Gray-scale sonography with graded compression, as well
as color Doppler and duplex ultrasound, were performed to evaluate
the deep venous system of the lower extremity from the level of the
common femoral vein through the popliteal and proximal calf veins.
Spectral Doppler was utilized to evaluate flow at rest and with
distal augmentation maneuvers.

[Series 1: us extrem low venous*right* · 14 of 24 slices shown]
[im 1/24]
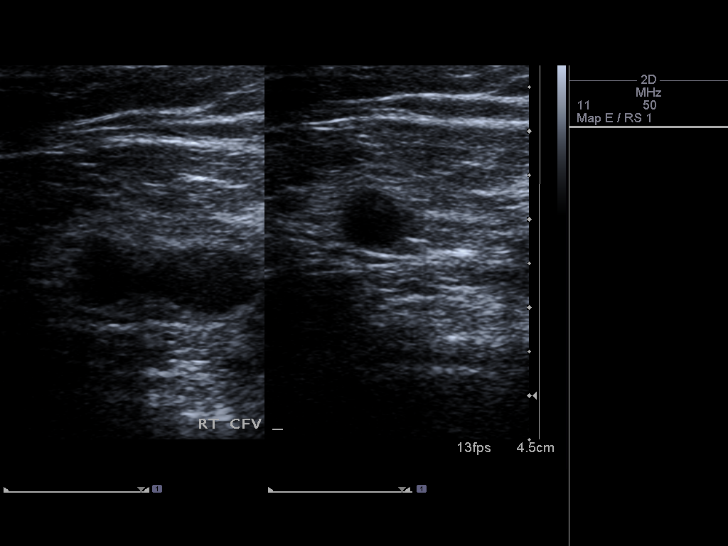
[im 3/24]
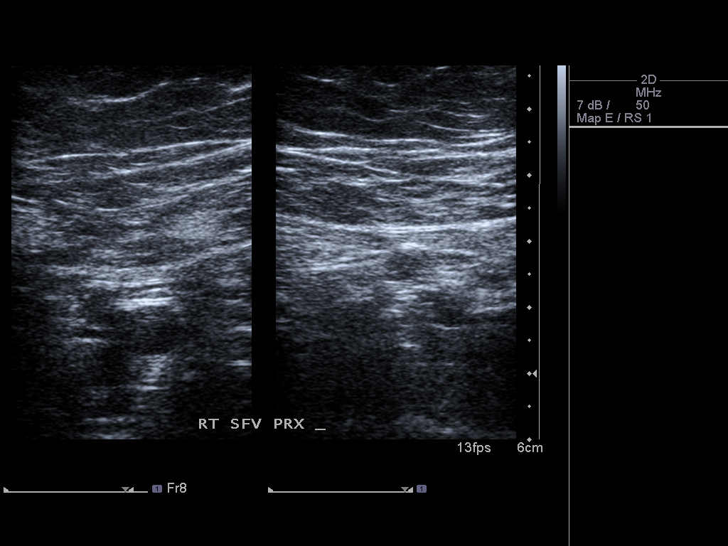
[im 5/24]
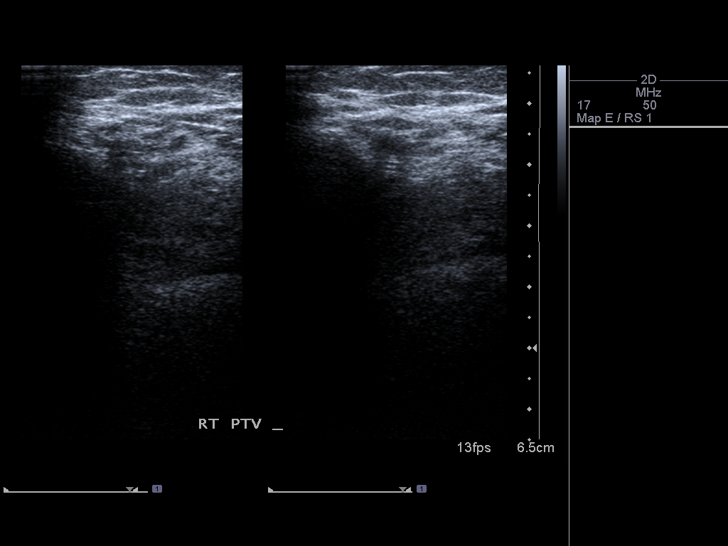
[im 7/24]
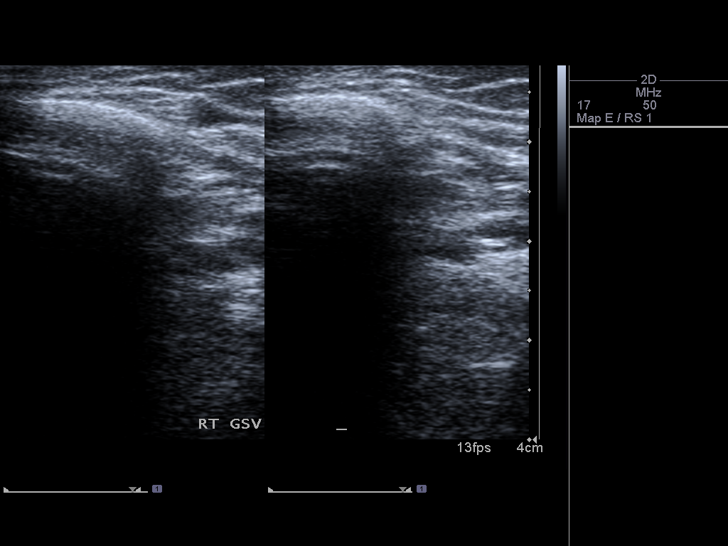
[im 8/24]
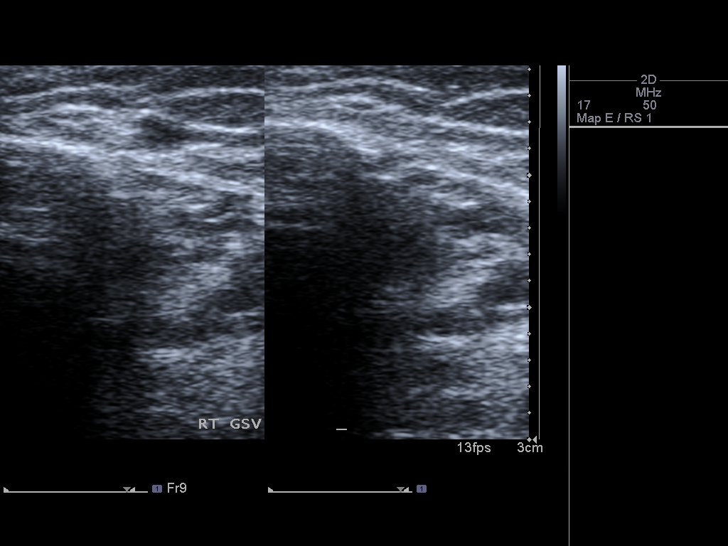
[im 10/24]
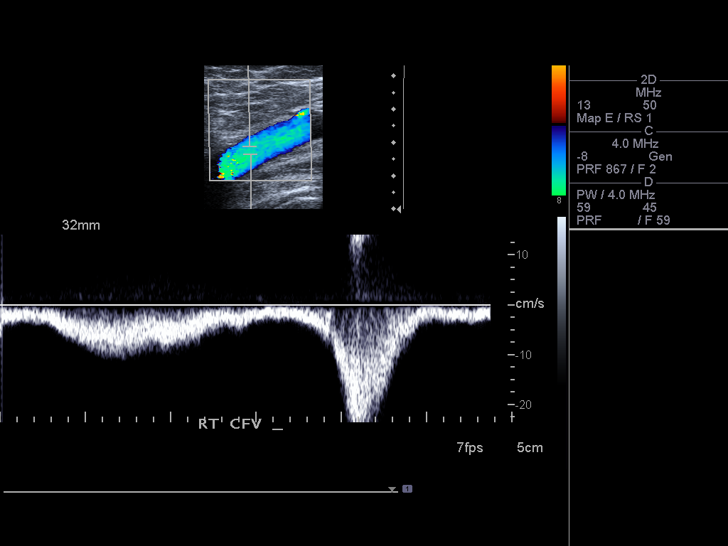
[im 12/24]
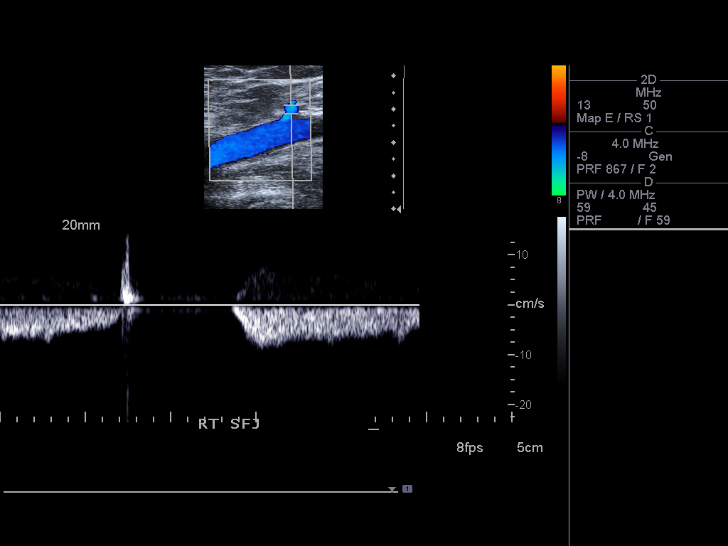
[im 13/24]
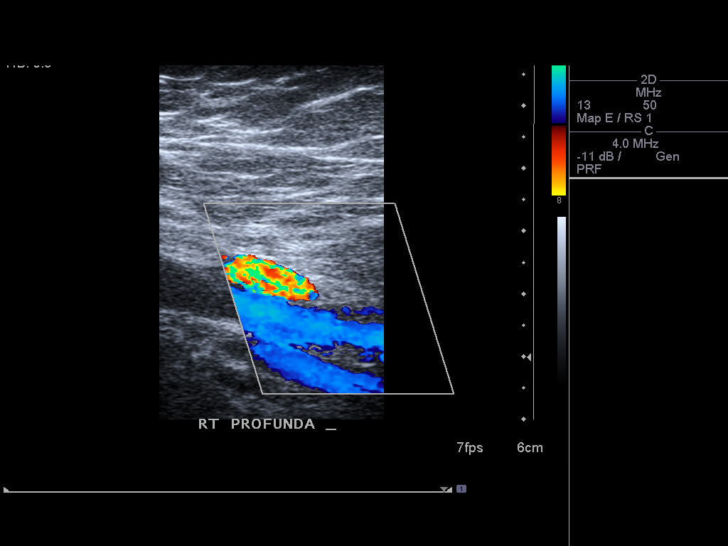
[im 15/24]
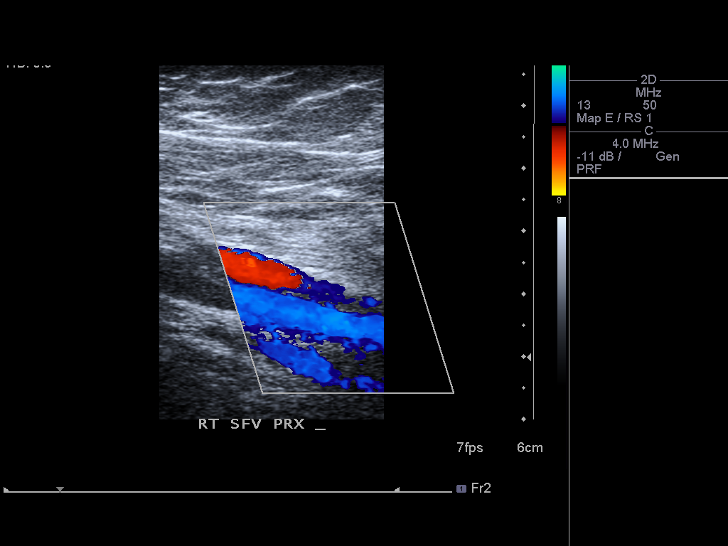
[im 17/24]
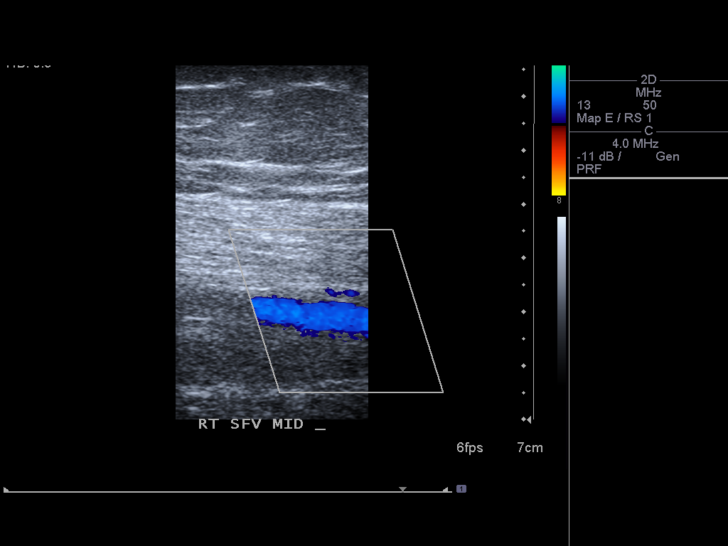
[im 19/24]
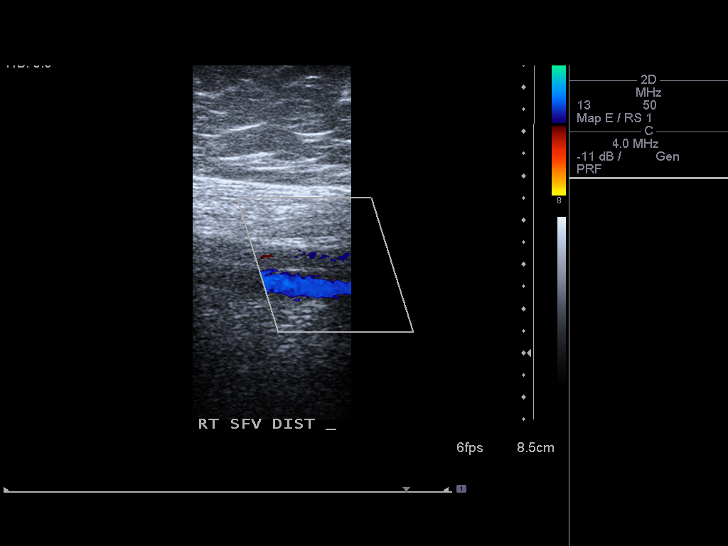
[im 20/24]
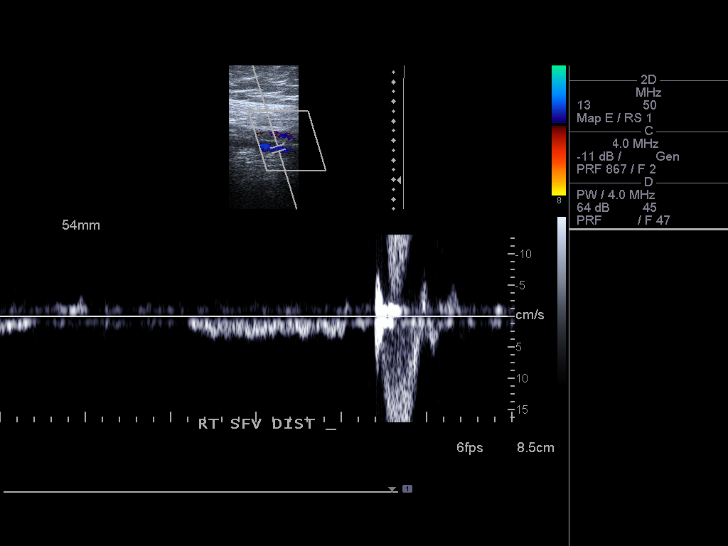
[im 22/24]
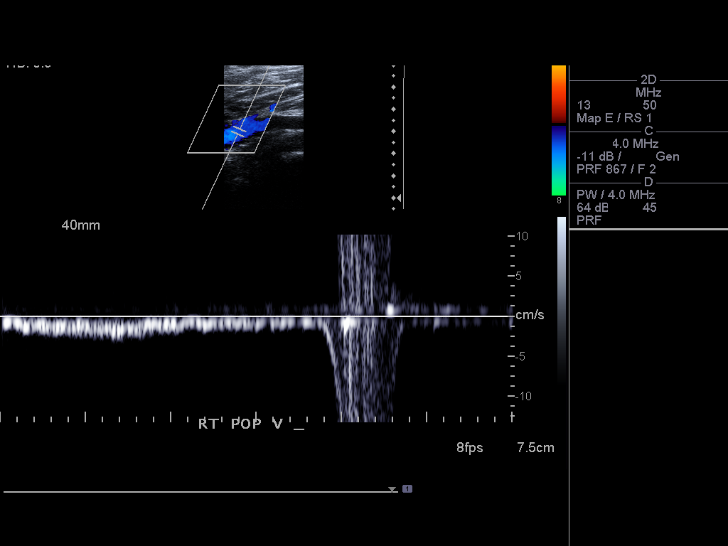
[im 24/24]
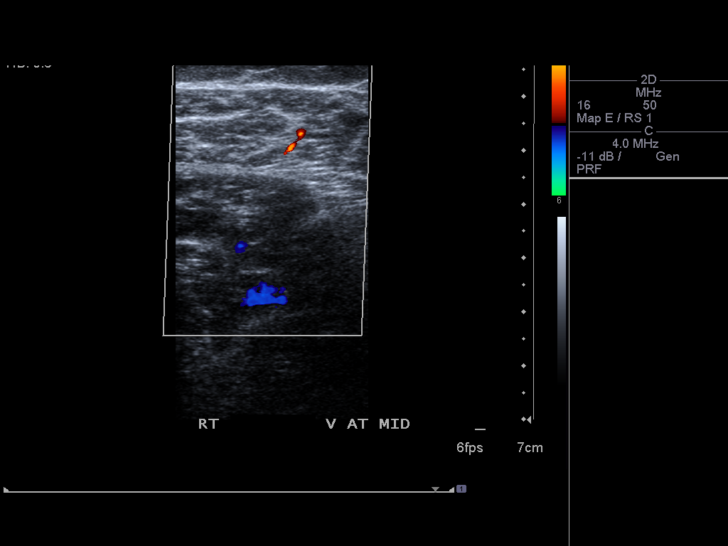

[14 of 24 positions shown; findings below may reference images not displayed]

FINDINGS: The visualized right lower extremity deep venous system
appears patent.

Normal compressibility.  Patent color Doppler flow.  Satisfactory
spectral Doppler with respiratory variation and response to
augmentation.
IMPRESSION: No deep venous thrombosis in the visualized right lower extremity.

## 2013-01-15 NOTE — Telephone Encounter (Signed)
Pt stated that she is still having problems with her meds. She went to [facility] and she was given fluids. She stated that she has been feeling bad for the past few days. She doesn't know what to do the meds are making her have cotton/dry mouth. She is trying to drink more fluids but it has been hard for her to do that. She's very tearful and stated that she has been very weak from all of what going on. Laureen Ochs, Viann Shove

## 2013-01-16 ENCOUNTER — Encounter: Payer: Self-pay | Admitting: Family Medicine

## 2013-01-16 ENCOUNTER — Ambulatory Visit (INDEPENDENT_AMBULATORY_CARE_PROVIDER_SITE_OTHER): Payer: Medicare Other | Admitting: Family Medicine

## 2013-01-16 VITALS — BP 145/89 | HR 98 | Wt 185.0 lb

## 2013-01-16 DIAGNOSIS — R109 Unspecified abdominal pain: Secondary | ICD-10-CM | POA: Diagnosis not present

## 2013-01-16 DIAGNOSIS — K828 Other specified diseases of gallbladder: Secondary | ICD-10-CM

## 2013-01-16 LAB — POCT URINALYSIS DIPSTICK
Bilirubin, UA: NEGATIVE
Blood, UA: NEGATIVE
Glucose, UA: NEGATIVE
Ketones, UA: NEGATIVE
Leukocytes, UA: NEGATIVE
Nitrite, UA: NEGATIVE
Protein, UA: NEGATIVE
Spec Grav, UA: >=1.03
Urobilinogen, UA: 0.2
pH, UA: 6

## 2013-01-16 MED ORDER — DICYCLOMINE HCL 10 MG/5ML PO SOLN: 20.0000 mg | Freq: Three times a day (TID) | ORAL | Status: DC

## 2013-01-16 NOTE — Progress Notes (Signed)
  Subjective:    Patient ID: Jennifer [name], female    DOB: 13-Apr-1966, 47 y.o.   MRN: [id number]  HPI Says she is spitting up some thick sputum last coupleof days. Just hasn't felt well at all.  No sig post nasal drip.  Has been having dizzy spells and HA for a few days.  Says her balance feels off again like she is on a skateboard or surfboard.  No worsening or alleviating factors. No chest pain or cough. No fever. Went to [facility] yesterday and got IV fluids. Has been set up wth a new neurologist. She is very upset and teaful today.  Says this started with epigastric apin.  Then bc nauseated and has been vomiting sputum (not really food). Started getting LLQ pain with she urinated and radiated around to her pelvis while at the ED, though only happened once..  Says they tested her urine but neg for infection.  Has noticed some white vaginal d/c.  Had some itchign aroudn her vaginal scar tissue. Hx of Paget's.  Hasn't followed up with her gyn/onc in quite sometime.  Has stopped her spirinolactone.  Got magnesium in the ED for her HA.  She says felt worse and didn't sleep well last night. Having some mild dizziness.    Review of Systems  No chest pain or cough. No fever.    Objective:   Physical Exam  Constitutional: She is oriented to person, place, and time. She appears well-developed and well-nourished.  HENT:  Head: Normocephalic and atraumatic.  Eyes: Conjunctivae are normal. Pupils are equal, round, and reactive to light.  Neck: Neck supple. No thyromegaly present.  Cardiovascular: Normal rate, regular rhythm and normal heart sounds.   Pulmonary/Chest: Effort normal and breath sounds normal.  Abdominal: Soft. Bowel sounds are normal. She exhibits no distension and no mass. There is tenderness. There is no rebound and no guarding.  Diffusely mildly tender.  Most tender in teh epigastrum.    Musculoskeletal: She exhibits no edema.  Neurological: She is alert and oriented to person, place,  and time.  Skin: Skin is warm and dry.  Psychiatric: She has a normal mood and affect. Her behavior is normal.          Assessment & Plan:  Nausea with epigastric pain - most consistent with stomach spasms or possibly biliary dyskinesia. She has been given dicyclomine liquid in the past. I will refill this today and encouraged her start on today. We'll make a big difference with the tightness cramping and epigastric area as well as the nausea and vomiting. She says she is taking her Zantac.  HTN-Stopped spironolactone.  I was concerned that the diuretic might dry her out too much as she does have a fair difficulty with keeping enough fluids in. At this point I definitely think she can stop it. I would encourage her to go back to her Micardis and Norvasc both of which she has done well on it just per her report causes palpitations. I reminded her that she typically gets palpitations off of medication as well so I'm still not convinced that the medication necessarily makes it worse.  Vaginitis - Will do wet prep.  Will call with results.   Paget's dz of vulva - Weil refer back to Dr. Noland Fordyce at [facility].  She is well overdue for her followup visit and she has noticed some changes recently.  HTN- restart micardis.  See note above.

## 2013-01-16 NOTE — Telephone Encounter (Signed)
Has schedulea n appt this am

## 2013-01-17 ENCOUNTER — Telehealth: Payer: Self-pay | Admitting: *Deleted

## 2013-01-17 DIAGNOSIS — N898 Other specified noninflammatory disorders of vagina: Secondary | ICD-10-CM | POA: Diagnosis not present

## 2013-01-17 DIAGNOSIS — R110 Nausea: Secondary | ICD-10-CM | POA: Diagnosis not present

## 2013-01-17 DIAGNOSIS — R1084 Generalized abdominal pain: Secondary | ICD-10-CM | POA: Diagnosis not present

## 2013-01-17 DIAGNOSIS — R109 Unspecified abdominal pain: Secondary | ICD-10-CM | POA: Diagnosis not present

## 2013-01-17 DIAGNOSIS — R002 Palpitations: Secondary | ICD-10-CM

## 2013-01-17 NOTE — Telephone Encounter (Signed)
OK, referral placed

## 2013-01-17 NOTE — Telephone Encounter (Signed)
Returned patient call to notify of results and she states she needs another referral to [facility]. States she tried to call but they told her with her insurance she will need a referral everytime she sees him.

## 2013-01-18 DIAGNOSIS — N760 Acute vaginitis: Secondary | ICD-10-CM | POA: Insufficient documentation

## 2013-01-20 ENCOUNTER — Telehealth: Payer: Self-pay | Admitting: *Deleted

## 2013-01-20 ENCOUNTER — Emergency Department (INDEPENDENT_AMBULATORY_CARE_PROVIDER_SITE_OTHER): Payer: Medicare Other

## 2013-01-20 ENCOUNTER — Emergency Department (INDEPENDENT_AMBULATORY_CARE_PROVIDER_SITE_OTHER)
Admission: EM | Admit: 2013-01-20 | Discharge: 2013-01-20 | Disposition: A | Discharge status: Home / Self Care | Payer: Medicare Other | Source: Home / Self Care | Attending: Family Medicine | Admitting: Family Medicine

## 2013-01-20 ENCOUNTER — Encounter: Payer: Self-pay | Admitting: *Deleted

## 2013-01-20 ENCOUNTER — Other Ambulatory Visit (HOSPITAL_BASED_OUTPATIENT_CLINIC_OR_DEPARTMENT_OTHER): Payer: Medicare Other

## 2013-01-20 DIAGNOSIS — R002 Palpitations: Secondary | ICD-10-CM

## 2013-01-20 DIAGNOSIS — Q619 Cystic kidney disease, unspecified: Secondary | ICD-10-CM | POA: Diagnosis not present

## 2013-01-20 DIAGNOSIS — D259 Leiomyoma of uterus, unspecified: Secondary | ICD-10-CM | POA: Diagnosis not present

## 2013-01-20 DIAGNOSIS — R109 Unspecified abdominal pain: Secondary | ICD-10-CM | POA: Diagnosis not present

## 2013-01-20 DIAGNOSIS — M169 Osteoarthritis of hip, unspecified: Secondary | ICD-10-CM

## 2013-01-20 DIAGNOSIS — D1803 Hemangioma of intra-abdominal structures: Secondary | ICD-10-CM | POA: Diagnosis not present

## 2013-01-20 DIAGNOSIS — M1610 Unilateral primary osteoarthritis, unspecified hip: Secondary | ICD-10-CM

## 2013-01-20 DIAGNOSIS — K5730 Diverticulosis of large intestine without perforation or abscess without bleeding: Secondary | ICD-10-CM | POA: Diagnosis not present

## 2013-01-20 DIAGNOSIS — K5900 Constipation, unspecified: Secondary | ICD-10-CM | POA: Diagnosis not present

## 2013-01-20 LAB — POCT URINALYSIS DIP (MANUAL ENTRY)
Bilirubin, UA: NEGATIVE
Glucose, UA: NEGATIVE
Ketones, POC UA: NEGATIVE
Leukocytes, UA: NEGATIVE
Nitrite, UA: NEGATIVE
Protein Ur, POC: 30
Spec Grav, UA: 1.02 (ref 1.005–1.03)
Urobilinogen, UA: 0.2 (ref 0–1)
pH, UA: 7 (ref 5–8)

## 2013-01-20 LAB — POCT URINE PREGNANCY: Preg Test, Ur: NEGATIVE

## 2013-01-20 NOTE — Telephone Encounter (Signed)
[facility] called and LM on VM that they got message of needing to schedule this pt with Dr. Philis Pique- he is booked out until July. They said if you wanted to get a monitor scheduled for her they could do that but if not appt would be July

## 2013-01-20 NOTE — ED Notes (Signed)
Pt c/o LLQ abd pain x 1 wk accompanied by bloating, nausea and some mucus in her stool. She has taken bentyl, phillips OTC and done an enema with no relief.
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
## 2013-01-21 DIAGNOSIS — F339 Major depressive disorder, recurrent, unspecified: Secondary | ICD-10-CM | POA: Diagnosis not present

## 2013-01-21 DIAGNOSIS — G4733 Obstructive sleep apnea (adult) (pediatric): Secondary | ICD-10-CM | POA: Diagnosis not present

## 2013-01-21 DIAGNOSIS — J343 Hypertrophy of nasal turbinates: Secondary | ICD-10-CM | POA: Diagnosis not present

## 2013-01-21 DIAGNOSIS — H814 Vertigo of central origin: Secondary | ICD-10-CM | POA: Diagnosis not present

## 2013-01-21 DIAGNOSIS — J310 Chronic rhinitis: Secondary | ICD-10-CM | POA: Diagnosis not present

## 2013-01-21 NOTE — Telephone Encounter (Signed)
Entered as cards ext referral.

## 2013-01-22 DIAGNOSIS — R110 Nausea: Secondary | ICD-10-CM | POA: Diagnosis not present

## 2013-01-22 DIAGNOSIS — R141 Gas pain: Secondary | ICD-10-CM | POA: Diagnosis not present

## 2013-01-22 DIAGNOSIS — K5900 Constipation, unspecified: Secondary | ICD-10-CM | POA: Diagnosis not present

## 2013-01-22 DIAGNOSIS — R109 Unspecified abdominal pain: Secondary | ICD-10-CM | POA: Diagnosis not present

## 2013-01-22 DIAGNOSIS — R142 Eructation: Secondary | ICD-10-CM | POA: Diagnosis not present

## 2013-01-22 DIAGNOSIS — F319 Bipolar disorder, unspecified: Secondary | ICD-10-CM | POA: Diagnosis not present

## 2013-01-22 LAB — URINE CULTURE: Colony Count: 25000

## 2013-01-22 NOTE — Telephone Encounter (Signed)
New referral placed for 48 hour Holter monitor and faxed [phone] or [phone].Loralee Pacas [place]

## 2013-01-22 NOTE — Addendum Note (Signed)
Addended by: Deno Etienne on: 01/22/2013 03:28 PM   Modules accepted: Orders

## 2013-01-23 ENCOUNTER — Ambulatory Visit (INDEPENDENT_AMBULATORY_CARE_PROVIDER_SITE_OTHER): Payer: Medicare Other | Admitting: Family Medicine

## 2013-01-23 ENCOUNTER — Encounter: Payer: Self-pay | Admitting: Family Medicine

## 2013-01-23 VITALS — BP 155/92 | HR 87 | Wt 185.0 lb

## 2013-01-23 DIAGNOSIS — R42 Dizziness and giddiness: Secondary | ICD-10-CM

## 2013-01-23 DIAGNOSIS — R1084 Generalized abdominal pain: Secondary | ICD-10-CM | POA: Diagnosis not present

## 2013-01-23 DIAGNOSIS — Z8669 Personal history of other diseases of the nervous system and sense organs: Secondary | ICD-10-CM

## 2013-01-23 DIAGNOSIS — Z87898 Personal history of other specified conditions: Secondary | ICD-10-CM

## 2013-01-23 LAB — WET PREP BY MOLECULAR PROBE
Candida species: NEGATIVE
Gardnerella vaginalis: NEGATIVE
Trichomonas vaginosis: NEGATIVE

## 2013-01-23 MED ORDER — LOSARTAN POTASSIUM 25 MG PO TABS: 25.0000 mg | ORAL_TABLET | Freq: Every day | ORAL | Status: DC
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
## 2013-01-24 DIAGNOSIS — K5900 Constipation, unspecified: Secondary | ICD-10-CM | POA: Diagnosis not present

## 2013-01-24 DIAGNOSIS — R141 Gas pain: Secondary | ICD-10-CM | POA: Diagnosis not present

## 2013-01-24 DIAGNOSIS — R1084 Generalized abdominal pain: Secondary | ICD-10-CM | POA: Diagnosis not present

## 2013-01-24 DIAGNOSIS — Z8669 Personal history of other diseases of the nervous system and sense organs: Secondary | ICD-10-CM | POA: Insufficient documentation

## 2013-01-24 DIAGNOSIS — R109 Unspecified abdominal pain: Secondary | ICD-10-CM | POA: Diagnosis not present

## 2013-01-24 DIAGNOSIS — Z87898 Personal history of other specified conditions: Secondary | ICD-10-CM | POA: Insufficient documentation

## 2013-01-24 DIAGNOSIS — R143 Flatulence: Secondary | ICD-10-CM | POA: Diagnosis not present

## 2013-01-27 DIAGNOSIS — D649 Anemia, unspecified: Secondary | ICD-10-CM | POA: Diagnosis not present

## 2013-01-27 DIAGNOSIS — F339 Major depressive disorder, recurrent, unspecified: Secondary | ICD-10-CM | POA: Diagnosis not present

## 2013-01-29 DIAGNOSIS — F319 Bipolar disorder, unspecified: Secondary | ICD-10-CM | POA: Diagnosis not present

## 2013-01-30 ENCOUNTER — Telehealth: Payer: Self-pay | Admitting: Family Medicine

## 2013-01-30 ENCOUNTER — Ambulatory Visit (INDEPENDENT_AMBULATORY_CARE_PROVIDER_SITE_OTHER): Payer: Medicare Other | Admitting: Sports Medicine

## 2013-01-30 ENCOUNTER — Encounter: Payer: Self-pay | Admitting: *Deleted

## 2013-01-30 ENCOUNTER — Encounter: Payer: Self-pay | Admitting: Sports Medicine

## 2013-01-30 VITALS — BP 146/88 | HR 101 | Wt 185.0 lb

## 2013-01-30 DIAGNOSIS — M549 Dorsalgia, unspecified: Secondary | ICD-10-CM | POA: Diagnosis not present

## 2013-01-30 DIAGNOSIS — M25519 Pain in unspecified shoulder: Secondary | ICD-10-CM | POA: Diagnosis not present

## 2013-01-30 DIAGNOSIS — R195 Other fecal abnormalities: Secondary | ICD-10-CM | POA: Diagnosis not present

## 2013-01-30 DIAGNOSIS — T148XXA Other injury of unspecified body region, initial encounter: Secondary | ICD-10-CM | POA: Diagnosis not present

## 2013-01-30 DIAGNOSIS — M542 Cervicalgia: Secondary | ICD-10-CM | POA: Diagnosis not present

## 2013-01-30 DIAGNOSIS — M7530 Calcific tendinitis of unspecified shoulder: Secondary | ICD-10-CM | POA: Diagnosis not present

## 2013-01-30 DIAGNOSIS — M7532 Calcific tendinitis of left shoulder: Secondary | ICD-10-CM

## 2013-01-30 DIAGNOSIS — X503XXA Overexertion from repetitive movements, initial encounter: Secondary | ICD-10-CM | POA: Diagnosis not present

## 2013-01-30 DIAGNOSIS — G8929 Other chronic pain: Secondary | ICD-10-CM | POA: Diagnosis not present

## 2013-01-30 DIAGNOSIS — IMO0002 Reserved for concepts with insufficient information to code with codable children: Secondary | ICD-10-CM | POA: Diagnosis not present

## 2013-01-30 DIAGNOSIS — X500XXA Overexertion from strenuous movement or load, initial encounter: Secondary | ICD-10-CM | POA: Diagnosis not present

## 2013-01-30 NOTE — Progress Notes (Signed)
  Subjective:    CC: Followup  HPI: Left shoulder pain: Jennifer [name] has been seen for this before, she's never had a cervical spine MRI, but did have an MRI of her left shoulder that showed calcific tendinitis of the supraspinatus. She's been through the physical therapy, and per patient has had shoulder injection, unclear whether this was subacromial, glenohumeral, or into the calcific deposit. To recap, she cannot use steroids as this often precipitates a thought disturbance process.  She does have some pain that radiates down the arm and no specific distribution. Pain is moderate to severe, and worse with overhead activities.  Past medical history, Surgical history, Family history not pertinant except as noted below, Social history, Allergies, and medications have been entered into the medical record, reviewed, and no changes needed.   Review of Systems: No fevers, chills, night sweats, weight loss, chest pain, or shortness of breath.   Objective:    General: Well Developed, well nourished, and in no acute distress.  Neuro: Alert and oriented x3, extra-ocular muscles intact, sensation grossly intact.  HEENT: Normocephalic, atraumatic, pupils equal round reactive to light, neck supple, no masses, no lymphadenopathy, thyroid nonpalpable.  Skin: Warm and dry, no rashes. Cardiac: Regular rate and rhythm, no murmurs rubs or gallops, no lower extremity edema.  Respiratory: Clear to auscultation bilaterally. Not using accessory muscles, speaking in full sentences. Left Shoulder: Inspection reveals no abnormalities, atrophy or asymmetry. Palpation is normal with no tenderness over AC joint or bicipital groove. ROM is full in all planes. Rotator cuff strength normal throughout. Positive negative Neer and Hawkin's tests, empty can sign. Speeds and Yergason's tests normal. No labral pathology noted with negative Obrien's, negative clunk and good stability. Normal scapular function observed. No  painful arc and no drop arm sign. No apprehension sign  Impression and Recommendations:

## 2013-01-30 NOTE — Assessment & Plan Note (Signed)
I do think a lot of her that the symptoms are coming from her calcific tendinitis of the left shoulder. I can easily treat this with injection therapy, and aspirating a calcific deposit from the rotator cuff tendon itself under guidance. Unfortunately we would need to use some steroid. I would like her to focus extensively on the rotator cuff rehabilitation at home, avoid overhead activities. At some point if she is amenable to doing a steroid medication injected, I be happy to do this, even if this meant we have to try to put her on an antipsychotic briefly. I do also suspect her radicular symptoms are predominantly related to abnormal scapular biomechanics and rotator cuff dysfunction.

## 2013-01-30 NOTE — Assessment & Plan Note (Signed)
Some of her symptoms are radicular. I am going to obtain an MRI for confirmation.

## 2013-02-03 ENCOUNTER — Telehealth: Payer: Self-pay

## 2013-02-03 DIAGNOSIS — F339 Major depressive disorder, recurrent, unspecified: Secondary | ICD-10-CM | POA: Diagnosis not present

## 2013-02-03 NOTE — Telephone Encounter (Signed)
No PA is needed for MRI Cervical Spine w/o Contrast. Bjorn Loser Jaydynn Wolford,CMA

## 2013-02-04 ENCOUNTER — Other Ambulatory Visit: Payer: Self-pay

## 2013-02-04 ENCOUNTER — Encounter (HOSPITAL_BASED_OUTPATIENT_CLINIC_OR_DEPARTMENT_OTHER): Payer: Self-pay | Admitting: *Deleted

## 2013-02-04 ENCOUNTER — Emergency Department (HOSPITAL_BASED_OUTPATIENT_CLINIC_OR_DEPARTMENT_OTHER)
Admission: EM | Admit: 2013-02-04 | Discharge: 2013-02-04 | Disposition: A | Discharge status: Home / Self Care | Payer: Medicare Other | Attending: Emergency Medicine | Admitting: Emergency Medicine

## 2013-02-04 ENCOUNTER — Emergency Department (HOSPITAL_BASED_OUTPATIENT_CLINIC_OR_DEPARTMENT_OTHER): Payer: Medicare Other

## 2013-02-04 DIAGNOSIS — Z79899 Other long term (current) drug therapy: Secondary | ICD-10-CM | POA: Insufficient documentation

## 2013-02-04 DIAGNOSIS — K219 Gastro-esophageal reflux disease without esophagitis: Secondary | ICD-10-CM | POA: Insufficient documentation

## 2013-02-04 DIAGNOSIS — Z8739 Personal history of other diseases of the musculoskeletal system and connective tissue: Secondary | ICD-10-CM | POA: Insufficient documentation

## 2013-02-04 DIAGNOSIS — Z87891 Personal history of nicotine dependence: Secondary | ICD-10-CM | POA: Diagnosis not present

## 2013-02-04 DIAGNOSIS — R0789 Other chest pain: Secondary | ICD-10-CM | POA: Insufficient documentation

## 2013-02-04 DIAGNOSIS — Z8669 Personal history of other diseases of the nervous system and sense organs: Secondary | ICD-10-CM | POA: Diagnosis not present

## 2013-02-04 DIAGNOSIS — Z880 Allergy status to penicillin: Secondary | ICD-10-CM | POA: Insufficient documentation

## 2013-02-04 DIAGNOSIS — J45901 Unspecified asthma with (acute) exacerbation: Secondary | ICD-10-CM | POA: Diagnosis not present

## 2013-02-04 DIAGNOSIS — F489 Nonpsychotic mental disorder, unspecified: Secondary | ICD-10-CM | POA: Diagnosis not present

## 2013-02-04 DIAGNOSIS — Z8639 Personal history of other endocrine, nutritional and metabolic disease: Secondary | ICD-10-CM | POA: Insufficient documentation

## 2013-02-04 DIAGNOSIS — J069 Acute upper respiratory infection, unspecified: Secondary | ICD-10-CM | POA: Diagnosis not present

## 2013-02-04 DIAGNOSIS — I10 Essential (primary) hypertension: Secondary | ICD-10-CM | POA: Insufficient documentation

## 2013-02-04 DIAGNOSIS — Z8679 Personal history of other diseases of the circulatory system: Secondary | ICD-10-CM | POA: Diagnosis not present

## 2013-02-04 DIAGNOSIS — Z8709 Personal history of other diseases of the respiratory system: Secondary | ICD-10-CM | POA: Insufficient documentation

## 2013-02-04 DIAGNOSIS — F411 Generalized anxiety disorder: Secondary | ICD-10-CM | POA: Insufficient documentation

## 2013-02-04 DIAGNOSIS — G40209 Localization-related (focal) (partial) symptomatic epilepsy and epileptic syndromes with complex partial seizures, not intractable, without status epilepticus: Secondary | ICD-10-CM | POA: Diagnosis not present

## 2013-02-04 DIAGNOSIS — N926 Irregular menstruation, unspecified: Secondary | ICD-10-CM | POA: Diagnosis not present

## 2013-02-04 DIAGNOSIS — IMO0002 Reserved for concepts with insufficient information to code with codable children: Secondary | ICD-10-CM | POA: Insufficient documentation

## 2013-02-04 DIAGNOSIS — Z8619 Personal history of other infectious and parasitic diseases: Secondary | ICD-10-CM | POA: Diagnosis not present

## 2013-02-04 DIAGNOSIS — Z862 Personal history of diseases of the blood and blood-forming organs and certain disorders involving the immune mechanism: Secondary | ICD-10-CM | POA: Diagnosis not present

## 2013-02-04 DIAGNOSIS — Z8742 Personal history of other diseases of the female genital tract: Secondary | ICD-10-CM | POA: Diagnosis not present

## 2013-02-04 DIAGNOSIS — R42 Dizziness and giddiness: Secondary | ICD-10-CM | POA: Diagnosis not present

## 2013-02-04 DIAGNOSIS — R002 Palpitations: Secondary | ICD-10-CM | POA: Diagnosis not present

## 2013-02-04 DIAGNOSIS — Z8659 Personal history of other mental and behavioral disorders: Secondary | ICD-10-CM | POA: Insufficient documentation

## 2013-02-04 DIAGNOSIS — N8501 Benign endometrial hyperplasia: Secondary | ICD-10-CM | POA: Diagnosis not present

## 2013-02-04 LAB — COMPREHENSIVE METABOLIC PANEL
ALT: 24 U/L (ref 0–35)
AST: 16 U/L (ref 0–37)
Albumin: 3.9 g/dL (ref 3.5–5.2)
Alkaline Phosphatase: 60 U/L (ref 39–117)
BUN: 12 mg/dL (ref 6–23)
CO2: 23 mEq/L (ref 19–32)
Calcium: 9.8 mg/dL (ref 8.4–10.5)
Chloride: 105 mEq/L (ref 96–112)
Creatinine, Ser: 0.5 mg/dL (ref 0.50–1.10)
GFR calc Af Amer: >90 mL/min (ref >90)
GFR calc non Af Amer: >90 mL/min (ref >90)
Glucose, Bld: 96 mg/dL (ref 70–99)
Potassium: 3.6 mEq/L (ref 3.5–5.1)
Sodium: 138 mEq/L (ref 135–145)
Total Bilirubin: 0.2 mg/dL — ABNORMAL LOW (ref 0.3–1.2)
Total Protein: 7.4 g/dL (ref 6.0–8.3)

## 2013-02-04 LAB — CBC WITH DIFFERENTIAL/PLATELET
Basophils Absolute: 0 10*3/uL (ref 0.0–0.1)
Basophils Relative: 0 % (ref 0–1)
Eosinophils Absolute: 0.1 10*3/uL (ref 0.0–0.7)
Eosinophils Relative: 2 % (ref 0–5)
HCT: 42.2 % (ref 36.0–46.0)
Hemoglobin: 14.5 g/dL (ref 12.0–15.0)
Lymphocytes Relative: 22 % (ref 12–46)
Lymphs Abs: 1.6 10*3/uL (ref 0.7–4.0)
MCH: 30.5 pg (ref 26.0–34.0)
MCHC: 34.4 g/dL (ref 30.0–36.0)
MCV: 88.7 fL (ref 78.0–100.0)
Monocytes Absolute: 0.7 10*3/uL (ref 0.1–1.0)
Monocytes Relative: 9 % (ref 3–12)
Neutro Abs: 4.8 10*3/uL (ref 1.7–7.7)
Neutrophils Relative %: 67 % (ref 43–77)
Platelets: 224 10*3/uL (ref 150–400)
RBC: 4.76 MIL/uL (ref 3.87–5.11)
RDW: 13.2 % (ref 11.5–15.5)
WBC: 7.2 10*3/uL (ref 4.0–10.5)

## 2013-02-04 LAB — PRO B NATRIURETIC PEPTIDE: Pro B Natriuretic peptide (BNP): 50.5 pg/mL (ref 0–125)

## 2013-02-04 LAB — D-DIMER, QUANTITATIVE: D-Dimer, Quant: <0.27 ug/mL-FEU (ref 0.00–0.48)

## 2013-02-04 LAB — TROPONIN I: Troponin I: <0.3 ng/mL (ref <0.30)

## 2013-02-04 MED ORDER — BENZONATATE 100 MG PO CAPS: 100.0000 mg | ORAL_CAPSULE | Freq: Three times a day (TID) | ORAL | Status: DC

## 2013-02-04 NOTE — ED Notes (Signed)
Pt refuses to wear a gown, reports that it breaks her out.

## 2013-02-04 NOTE — ED Notes (Signed)
Pt refuses IV, requests to wait until results are back to see if IV is needed.
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
## 2013-02-04 NOTE — ED Notes (Signed)
MD at bedside. 

## 2013-02-04 NOTE — ED Notes (Signed)
Pt c/o cough and lung pain since last night.

## 2013-02-04 NOTE — ED Notes (Signed)
Pt reports that she went to [facility] and waited for 3 hours without being seen so left and came here.

## 2013-02-06 ENCOUNTER — Telehealth: Payer: Self-pay | Admitting: *Deleted

## 2013-02-06 DIAGNOSIS — R0602 Shortness of breath: Secondary | ICD-10-CM | POA: Diagnosis not present

## 2013-02-06 DIAGNOSIS — R0789 Other chest pain: Secondary | ICD-10-CM | POA: Diagnosis not present

## 2013-02-06 DIAGNOSIS — R05 Cough: Secondary | ICD-10-CM | POA: Diagnosis not present

## 2013-02-06 DIAGNOSIS — R059 Cough, unspecified: Secondary | ICD-10-CM | POA: Diagnosis not present

## 2013-02-06 DIAGNOSIS — R000 Tachycardia, unspecified: Secondary | ICD-10-CM | POA: Diagnosis not present

## 2013-02-06 DIAGNOSIS — F339 Major depressive disorder, recurrent, unspecified: Secondary | ICD-10-CM | POA: Diagnosis not present

## 2013-02-06 DIAGNOSIS — R002 Palpitations: Secondary | ICD-10-CM

## 2013-02-06 DIAGNOSIS — R079 Chest pain, unspecified: Secondary | ICD-10-CM | POA: Diagnosis not present

## 2013-02-06 NOTE — Telephone Encounter (Signed)
Pt called and informed me that the tape that is used on the holter monitor is causing her to break out. I spoke with Dr.Metheney and informed her of this. New order for event monitor to be placed. Will call pt and inform her of this.   Spoke with [facility] and they will change out the monitor tomorrow 6.27.14 @ 1115.   Called pt and lvm informing her of appt time.Loralee Pacas [place]

## 2013-02-07 DIAGNOSIS — R002 Palpitations: Secondary | ICD-10-CM | POA: Diagnosis not present

## 2013-02-10 DIAGNOSIS — J029 Acute pharyngitis, unspecified: Secondary | ICD-10-CM | POA: Diagnosis not present

## 2013-02-10 DIAGNOSIS — Z882 Allergy status to sulfonamides status: Secondary | ICD-10-CM | POA: Diagnosis not present

## 2013-02-10 DIAGNOSIS — R059 Cough, unspecified: Secondary | ICD-10-CM | POA: Diagnosis not present

## 2013-02-10 DIAGNOSIS — Z881 Allergy status to other antibiotic agents status: Secondary | ICD-10-CM | POA: Diagnosis not present

## 2013-02-10 DIAGNOSIS — Z888 Allergy status to other drugs, medicaments and biological substances status: Secondary | ICD-10-CM | POA: Diagnosis not present

## 2013-02-10 DIAGNOSIS — M5412 Radiculopathy, cervical region: Secondary | ICD-10-CM | POA: Diagnosis not present

## 2013-02-10 DIAGNOSIS — Z8673 Personal history of transient ischemic attack (TIA), and cerebral infarction without residual deficits: Secondary | ICD-10-CM | POA: Diagnosis not present

## 2013-02-10 DIAGNOSIS — Z880 Allergy status to penicillin: Secondary | ICD-10-CM | POA: Diagnosis not present

## 2013-02-10 DIAGNOSIS — R0789 Other chest pain: Secondary | ICD-10-CM | POA: Diagnosis not present

## 2013-02-10 DIAGNOSIS — R1310 Dysphagia, unspecified: Secondary | ICD-10-CM | POA: Diagnosis not present

## 2013-02-10 DIAGNOSIS — R05 Cough: Secondary | ICD-10-CM | POA: Diagnosis not present

## 2013-02-10 DIAGNOSIS — K449 Diaphragmatic hernia without obstruction or gangrene: Secondary | ICD-10-CM | POA: Diagnosis not present

## 2013-02-10 DIAGNOSIS — I10 Essential (primary) hypertension: Secondary | ICD-10-CM | POA: Diagnosis not present

## 2013-02-10 IMAGING — CR DG CHEST 2V
2 series · 2 of 2 positions shown · non-contrast
Comparison: [DATE]

CLINICAL DATA: Cough.  Sore throat and SOB.

CHEST - 2 VIEW

[w chest pa]
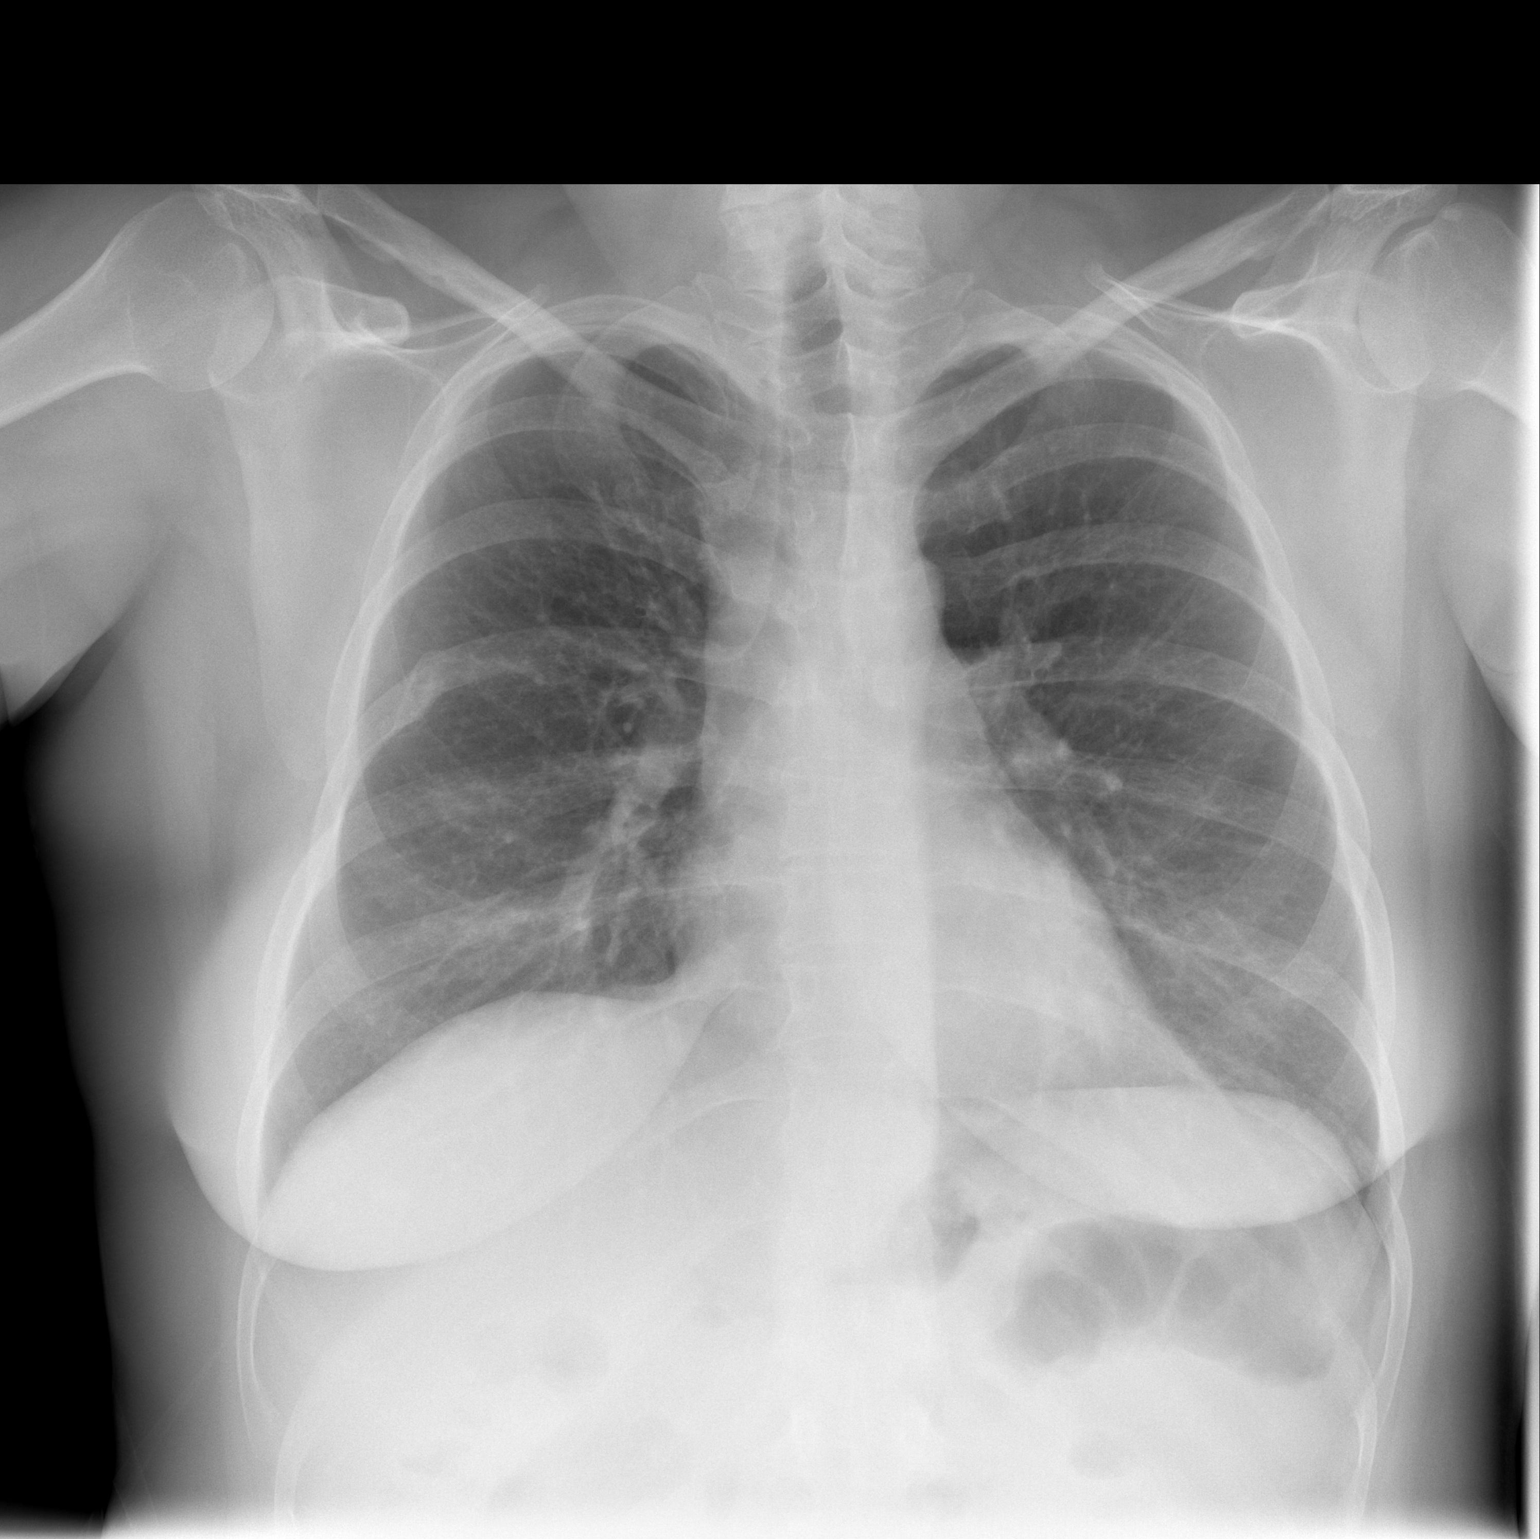

[w chest lat]
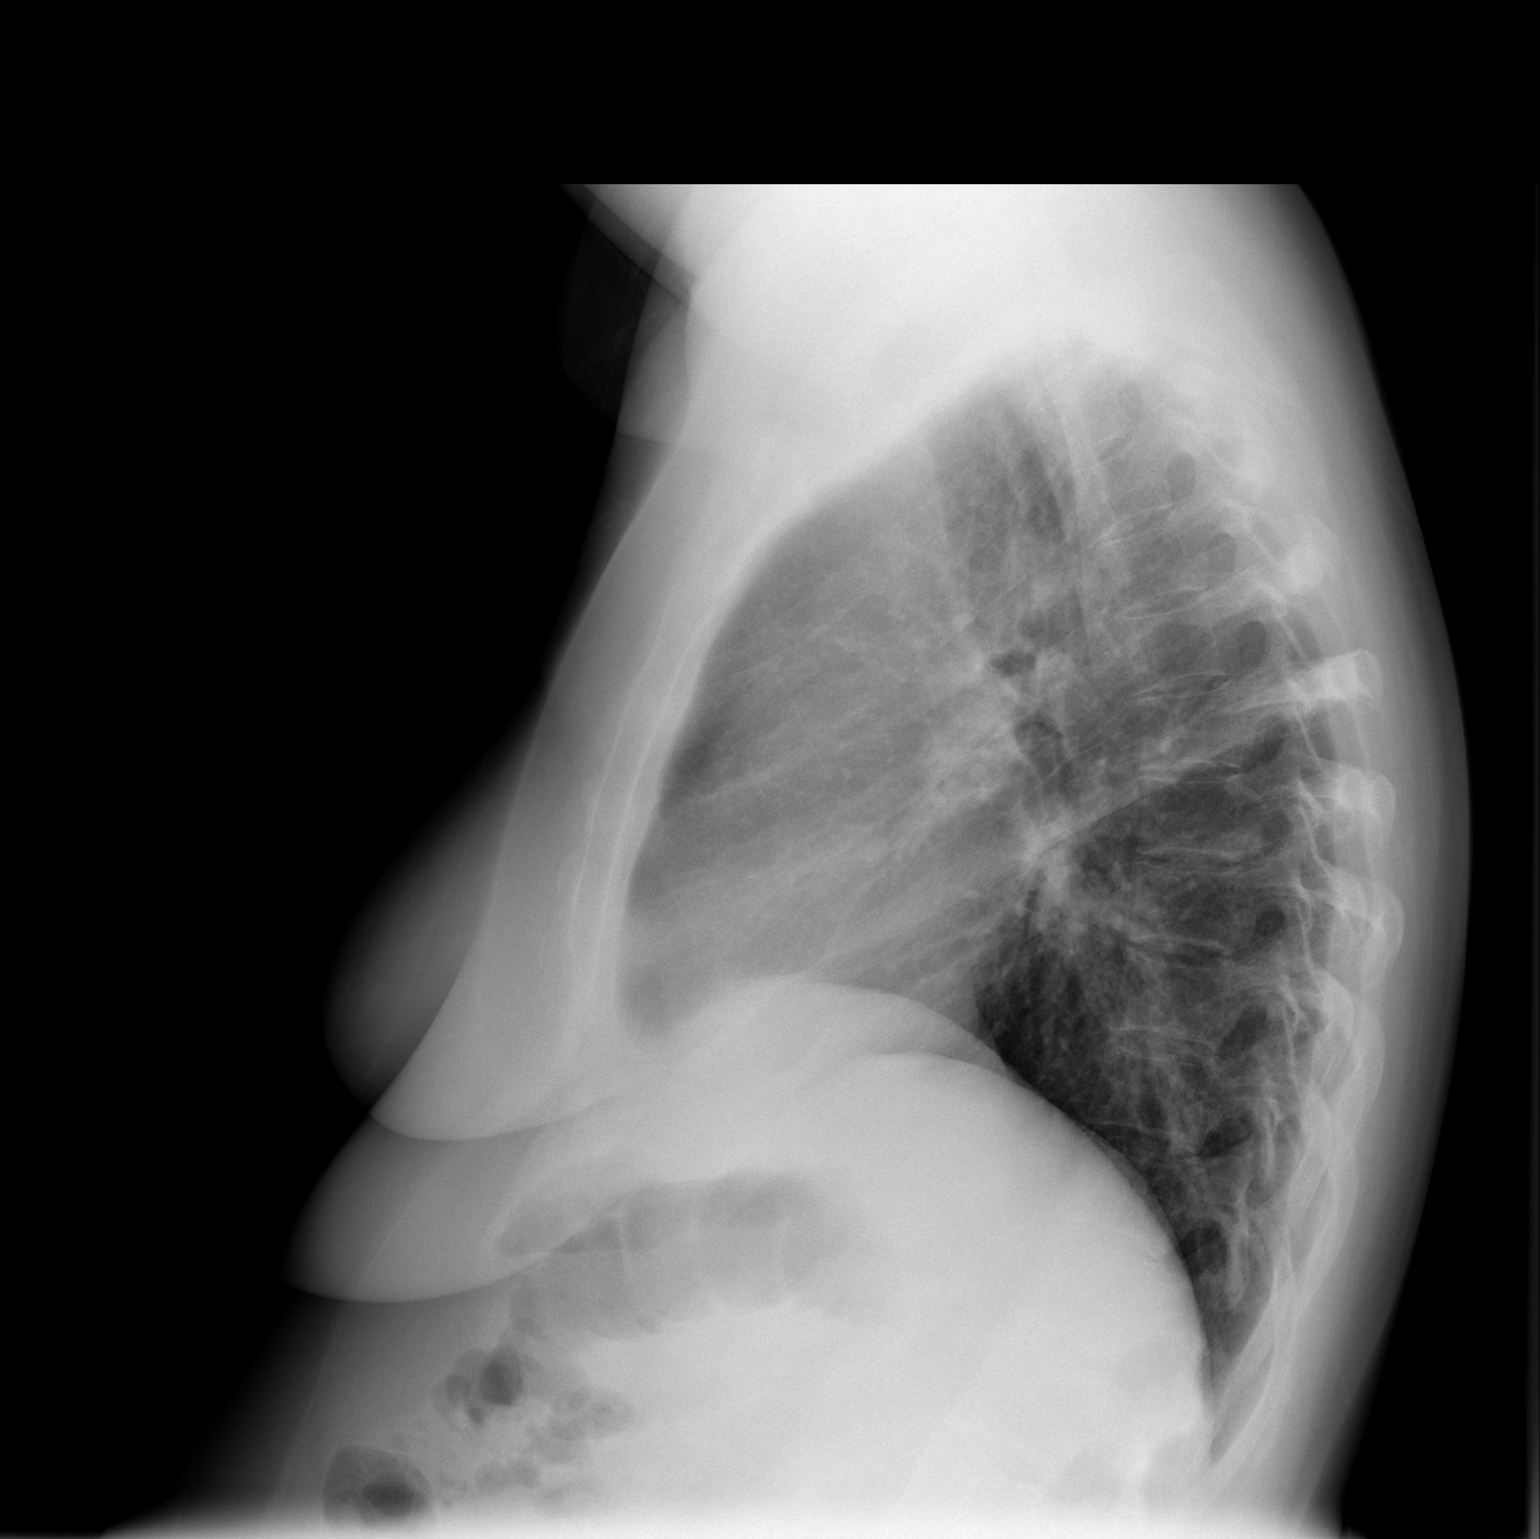

[2 of 2 positions shown; findings below may reference images not displayed]

FINDINGS: Heart size appears normal.

There is no pleural effusion or edema.

Atelectasis is noted in the left base.

No airspace consolidation.

Review of the visualized osseous structures is significant for
chronic appearing right posterior sixth rib fracture.
IMPRESSION: 1.  Left base atelectasis.
2.  No pneumonia.

## 2013-02-11 ENCOUNTER — Ambulatory Visit (INDEPENDENT_AMBULATORY_CARE_PROVIDER_SITE_OTHER): Payer: Medicare Other | Admitting: Family Medicine

## 2013-02-11 ENCOUNTER — Encounter: Payer: Self-pay | Admitting: Family Medicine

## 2013-02-11 ENCOUNTER — Telehealth: Payer: Self-pay | Admitting: Family Medicine

## 2013-02-11 VITALS — BP 144/91 | HR 101 | Wt 185.0 lb

## 2013-02-11 DIAGNOSIS — K449 Diaphragmatic hernia without obstruction or gangrene: Secondary | ICD-10-CM | POA: Diagnosis not present

## 2013-02-11 DIAGNOSIS — K2270 Barrett's esophagus without dysplasia: Secondary | ICD-10-CM | POA: Diagnosis not present

## 2013-02-11 DIAGNOSIS — R42 Dizziness and giddiness: Secondary | ICD-10-CM

## 2013-02-11 DIAGNOSIS — I10 Essential (primary) hypertension: Secondary | ICD-10-CM | POA: Diagnosis not present

## 2013-02-11 DIAGNOSIS — Z880 Allergy status to penicillin: Secondary | ICD-10-CM | POA: Diagnosis not present

## 2013-02-11 DIAGNOSIS — J45901 Unspecified asthma with (acute) exacerbation: Secondary | ICD-10-CM | POA: Diagnosis not present

## 2013-02-11 DIAGNOSIS — M25512 Pain in left shoulder: Secondary | ICD-10-CM

## 2013-02-11 DIAGNOSIS — R002 Palpitations: Secondary | ICD-10-CM | POA: Diagnosis not present

## 2013-02-11 DIAGNOSIS — Z888 Allergy status to other drugs, medicaments and biological substances status: Secondary | ICD-10-CM | POA: Diagnosis not present

## 2013-02-11 DIAGNOSIS — R1310 Dysphagia, unspecified: Secondary | ICD-10-CM | POA: Diagnosis not present

## 2013-02-11 DIAGNOSIS — M889 Osteitis deformans of unspecified bone: Secondary | ICD-10-CM | POA: Diagnosis not present

## 2013-02-11 DIAGNOSIS — M25519 Pain in unspecified shoulder: Secondary | ICD-10-CM | POA: Diagnosis not present

## 2013-02-11 DIAGNOSIS — Z883 Allergy status to other anti-infective agents status: Secondary | ICD-10-CM | POA: Diagnosis not present

## 2013-02-11 DIAGNOSIS — Z886 Allergy status to analgesic agent status: Secondary | ICD-10-CM | POA: Diagnosis not present

## 2013-02-11 NOTE — Telephone Encounter (Signed)
I. did speak with Dr. Rodney Langton. He did look up for her MRI of results of her cervical spine. It looks great. We did pull an old MRI report from 2013 that was performed at [facility] regional about left shoulder. She does have evidence of calcific tendinitis. He would be able to aspirate a toothpaste like substance out of that tendon and then injected with Marcaine which is basically a numbing medication without a steroid and see if this provide significant relief. This is done under ultrasound guidance.

## 2013-02-11 NOTE — Progress Notes (Signed)
Subjective:    Patient ID: Jennifer [name], female    DOB: [birth year], 47 y.o.   MRN: [id number]  HPI HTN - Given new rx for losartan at last OV. She hasn't tried it yet.   Did go back to see her Neurologist and showed him the abnormal VMG and hx fo seizure d/o.  He though ths could be frontal lobe.  He recommended she see a specialist for siezure d/o.  Dr. 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  Went for eye testing but had mascara on .   Has to pick up a new heart monitor today bc the one they gave her wouldn't charge.    Has episodes when suddenly feels like might collapse or pass out. Says can happen sitting or standing.  Not sure if related to her BP dropping.  Felt this way while here today.Lasted a few seconds.  Also having heavy heartbeats but not at the same time. Still feels really off balance.    She is going to have D/C on July 17th and the are going to eval for endometrial hyperplasia.   Review of Systems     Objective:   Physical Exam  Constitutional: She is oriented to person, place, and time. She appears well-developed and well-nourished.  HENT:  Head: Normocephalic and atraumatic.  Mouth/Throat: Oropharynx is clear and moist.  Cardiovascular: Normal rate, regular rhythm and normal heart sounds.   Pulmonary/Chest: Effort normal and breath sounds normal.  Neurological: She is alert and oriented to person, place, and time.  Skin: Skin is warm and dry.  Psychiatric: She has a normal mood and affect. Her behavior is normal.          Assessment & Plan:  HTN - Uncontrolled. Still encouraged her to start the losartan.  138/84. Tryto drink more today.   Left shoulder pain - Stil having sig pain and weakness. Had mri of neck done. Brought images for Dr. Karie Schwalbe to look over. She feels like her left shoulder is shot.  Had tried injections in the past. She has done exercises and not real improvement. She has persistant pain and has failed conservative therapy.  Would recommend refer to ortho surgery.    Palpitation - Will await holter monitor results.  If neg then a tilt table test. F/u with Dr. Talmadge Chad later this week. I encouraged her to push back the appointment until she has the results of the Holter monitor but she says she has several things she wants to talk to him about that she's going to keep the appointment for tomorrow..   Lightheaded/dizzy spells-they're extremely brief. One did occur during the office visit here. I checked her blood pressure within a couple minutes afterwards and it was actually a little bit lower than the original blood pressure but not significantly lower. Her heart rate did not go up or down within a couple minutes after the episode but it was not checked during the episode.  Mild ST - make sure drinking plenty of fluids.  No fever or other URI sxs.  Exam is normal. Gave reassurance.  Addendum-I. did speak with Dr. Rodney Langton. He did look up for her MRI of results of her cervical spine. It looks great. We did pull an old MRI report from 2013 that was performed at [facility] regional about left shoulder. She does have evidence of calcific tendinitis. He would be able to aspirate a toothpaste like substance out of that tendon and then injected  with Marcaine which is basically a numbing medication without a steroid and see if this provide significant relief.  Time spent 25 minutes, greater than 50% of the time spent counseling her blood pressure, shoulder pain, palpitations and lightheadedness.

## 2013-02-11 NOTE — Telephone Encounter (Signed)
Pt informed. She stated that she got a call from someone downstairs Dr. Orlan Leavens? She has an appt on 7.11.2014.Loralee Pacas [place]

## 2013-02-12 DIAGNOSIS — F339 Major depressive disorder, recurrent, unspecified: Secondary | ICD-10-CM | POA: Diagnosis not present

## 2013-02-12 DIAGNOSIS — R42 Dizziness and giddiness: Secondary | ICD-10-CM | POA: Diagnosis not present

## 2013-02-12 DIAGNOSIS — R5383 Other fatigue: Secondary | ICD-10-CM | POA: Diagnosis not present

## 2013-02-12 DIAGNOSIS — R000 Tachycardia, unspecified: Secondary | ICD-10-CM | POA: Diagnosis not present

## 2013-02-12 DIAGNOSIS — I10 Essential (primary) hypertension: Secondary | ICD-10-CM | POA: Diagnosis not present

## 2013-02-12 DIAGNOSIS — R55 Syncope and collapse: Secondary | ICD-10-CM | POA: Diagnosis not present

## 2013-02-12 DIAGNOSIS — R5381 Other malaise: Secondary | ICD-10-CM | POA: Diagnosis not present

## 2013-02-13 ENCOUNTER — Emergency Department (HOSPITAL_BASED_OUTPATIENT_CLINIC_OR_DEPARTMENT_OTHER): Payer: Medicare Other

## 2013-02-13 ENCOUNTER — Encounter (HOSPITAL_BASED_OUTPATIENT_CLINIC_OR_DEPARTMENT_OTHER): Payer: Self-pay | Admitting: *Deleted

## 2013-02-13 ENCOUNTER — Emergency Department (HOSPITAL_BASED_OUTPATIENT_CLINIC_OR_DEPARTMENT_OTHER)
Admission: EM | Admit: 2013-02-13 | Discharge: 2013-02-13 | Disposition: A | Discharge status: Home / Self Care | Payer: Medicare Other | Attending: Emergency Medicine | Admitting: Emergency Medicine

## 2013-02-13 DIAGNOSIS — J45909 Unspecified asthma, uncomplicated: Secondary | ICD-10-CM | POA: Insufficient documentation

## 2013-02-13 DIAGNOSIS — Z8709 Personal history of other diseases of the respiratory system: Secondary | ICD-10-CM | POA: Diagnosis not present

## 2013-02-13 DIAGNOSIS — Z862 Personal history of diseases of the blood and blood-forming organs and certain disorders involving the immune mechanism: Secondary | ICD-10-CM | POA: Insufficient documentation

## 2013-02-13 DIAGNOSIS — R1011 Right upper quadrant pain: Secondary | ICD-10-CM | POA: Diagnosis not present

## 2013-02-13 DIAGNOSIS — R5381 Other malaise: Secondary | ICD-10-CM | POA: Insufficient documentation

## 2013-02-13 DIAGNOSIS — Z79899 Other long term (current) drug therapy: Secondary | ICD-10-CM | POA: Diagnosis not present

## 2013-02-13 DIAGNOSIS — G4730 Sleep apnea, unspecified: Secondary | ICD-10-CM | POA: Diagnosis not present

## 2013-02-13 DIAGNOSIS — R5383 Other fatigue: Secondary | ICD-10-CM | POA: Insufficient documentation

## 2013-02-13 DIAGNOSIS — Z8679 Personal history of other diseases of the circulatory system: Secondary | ICD-10-CM | POA: Insufficient documentation

## 2013-02-13 DIAGNOSIS — R079 Chest pain, unspecified: Secondary | ICD-10-CM | POA: Diagnosis not present

## 2013-02-13 DIAGNOSIS — Z8639 Personal history of other endocrine, nutritional and metabolic disease: Secondary | ICD-10-CM | POA: Insufficient documentation

## 2013-02-13 DIAGNOSIS — Z8739 Personal history of other diseases of the musculoskeletal system and connective tissue: Secondary | ICD-10-CM | POA: Insufficient documentation

## 2013-02-13 DIAGNOSIS — Z8669 Personal history of other diseases of the nervous system and sense organs: Secondary | ICD-10-CM | POA: Insufficient documentation

## 2013-02-13 DIAGNOSIS — Z880 Allergy status to penicillin: Secondary | ICD-10-CM | POA: Insufficient documentation

## 2013-02-13 DIAGNOSIS — K219 Gastro-esophageal reflux disease without esophagitis: Secondary | ICD-10-CM | POA: Diagnosis not present

## 2013-02-13 DIAGNOSIS — R0981 Nasal congestion: Secondary | ICD-10-CM

## 2013-02-13 DIAGNOSIS — Z8742 Personal history of other diseases of the female genital tract: Secondary | ICD-10-CM | POA: Diagnosis not present

## 2013-02-13 DIAGNOSIS — R112 Nausea with vomiting, unspecified: Secondary | ICD-10-CM | POA: Diagnosis not present

## 2013-02-13 DIAGNOSIS — Z9109 Other allergy status, other than to drugs and biological substances: Secondary | ICD-10-CM | POA: Diagnosis not present

## 2013-02-13 DIAGNOSIS — IMO0002 Reserved for concepts with insufficient information to code with codable children: Secondary | ICD-10-CM | POA: Diagnosis not present

## 2013-02-13 DIAGNOSIS — R0982 Postnasal drip: Secondary | ICD-10-CM | POA: Insufficient documentation

## 2013-02-13 DIAGNOSIS — R0789 Other chest pain: Secondary | ICD-10-CM | POA: Diagnosis not present

## 2013-02-13 DIAGNOSIS — Z888 Allergy status to other drugs, medicaments and biological substances status: Secondary | ICD-10-CM | POA: Diagnosis not present

## 2013-02-13 DIAGNOSIS — Z881 Allergy status to other antibiotic agents status: Secondary | ICD-10-CM | POA: Diagnosis not present

## 2013-02-13 DIAGNOSIS — G894 Chronic pain syndrome: Secondary | ICD-10-CM | POA: Diagnosis not present

## 2013-02-13 DIAGNOSIS — Z87891 Personal history of nicotine dependence: Secondary | ICD-10-CM | POA: Insufficient documentation

## 2013-02-13 DIAGNOSIS — R059 Cough, unspecified: Secondary | ICD-10-CM | POA: Diagnosis not present

## 2013-02-13 DIAGNOSIS — I10 Essential (primary) hypertension: Secondary | ICD-10-CM | POA: Insufficient documentation

## 2013-02-13 DIAGNOSIS — G40909 Epilepsy, unspecified, not intractable, without status epilepticus: Secondary | ICD-10-CM | POA: Diagnosis not present

## 2013-02-13 DIAGNOSIS — R05 Cough: Secondary | ICD-10-CM | POA: Diagnosis not present

## 2013-02-13 DIAGNOSIS — F4310 Post-traumatic stress disorder, unspecified: Secondary | ICD-10-CM | POA: Insufficient documentation

## 2013-02-13 DIAGNOSIS — Z8673 Personal history of transient ischemic attack (TIA), and cerebral infarction without residual deficits: Secondary | ICD-10-CM | POA: Diagnosis not present

## 2013-02-13 DIAGNOSIS — M546 Pain in thoracic spine: Secondary | ICD-10-CM | POA: Diagnosis not present

## 2013-02-13 DIAGNOSIS — J3489 Other specified disorders of nose and nasal sinuses: Secondary | ICD-10-CM | POA: Insufficient documentation

## 2013-02-13 DIAGNOSIS — H9209 Otalgia, unspecified ear: Secondary | ICD-10-CM | POA: Diagnosis not present

## 2013-02-13 MED ORDER — FEXOFENADINE HCL 60 MG PO TABS: 60.0000 mg | ORAL_TABLET | Freq: Two times a day (BID) | ORAL | Status: DC

## 2013-02-13 MED ORDER — ONDANSETRON 4 MG PO TBDP
4.0000 mg | ORAL_TABLET | Freq: Once | ORAL | Status: AC
  Administered 2013-02-13: 4 mg via ORAL
  Filled 2013-02-13: qty 1

## 2013-02-13 MED ORDER — ONDANSETRON 4 MG PO TBDP: ORAL_TABLET | ORAL | Status: DC

## 2013-02-13 MED ORDER — OXYMETAZOLINE HCL 0.05 % NA SOLN
2.0000 | Freq: Two times a day (BID) | NASAL | Status: DC
  Administered 2013-02-13: 2 via NASAL
  Filled 2013-02-13: qty 15

## 2013-02-13 NOTE — ED Notes (Signed)
MD at bedside. 
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
## 2013-02-13 NOTE — ED Notes (Signed)
Patient states she has been sob for several weeks which associated with upper sinus congestion and bloody green sinus drainage.  States yesterday she had nausea and vomiting.

## 2013-02-13 NOTE — ED Notes (Signed)
Pt refused XRay 

## 2013-02-14 ENCOUNTER — Encounter (HOSPITAL_BASED_OUTPATIENT_CLINIC_OR_DEPARTMENT_OTHER): Payer: Self-pay | Admitting: *Deleted

## 2013-02-14 ENCOUNTER — Emergency Department (HOSPITAL_BASED_OUTPATIENT_CLINIC_OR_DEPARTMENT_OTHER)
Admission: EM | Admit: 2013-02-14 | Discharge: 2013-02-15 | Disposition: A | Discharge status: Home / Self Care | Payer: Medicare Other | Attending: Emergency Medicine | Admitting: Emergency Medicine

## 2013-02-14 DIAGNOSIS — Z8709 Personal history of other diseases of the respiratory system: Secondary | ICD-10-CM | POA: Insufficient documentation

## 2013-02-14 DIAGNOSIS — R071 Chest pain on breathing: Secondary | ICD-10-CM | POA: Diagnosis not present

## 2013-02-14 DIAGNOSIS — R091 Pleurisy: Secondary | ICD-10-CM | POA: Insufficient documentation

## 2013-02-14 DIAGNOSIS — Z8679 Personal history of other diseases of the circulatory system: Secondary | ICD-10-CM | POA: Insufficient documentation

## 2013-02-14 DIAGNOSIS — R0602 Shortness of breath: Secondary | ICD-10-CM | POA: Diagnosis not present

## 2013-02-14 DIAGNOSIS — Z8659 Personal history of other mental and behavioral disorders: Secondary | ICD-10-CM | POA: Insufficient documentation

## 2013-02-14 DIAGNOSIS — Z87891 Personal history of nicotine dependence: Secondary | ICD-10-CM | POA: Insufficient documentation

## 2013-02-14 DIAGNOSIS — G4730 Sleep apnea, unspecified: Secondary | ICD-10-CM | POA: Diagnosis not present

## 2013-02-14 DIAGNOSIS — G40909 Epilepsy, unspecified, not intractable, without status epilepticus: Secondary | ICD-10-CM | POA: Diagnosis not present

## 2013-02-14 DIAGNOSIS — Z8639 Personal history of other endocrine, nutritional and metabolic disease: Secondary | ICD-10-CM | POA: Insufficient documentation

## 2013-02-14 DIAGNOSIS — G8929 Other chronic pain: Secondary | ICD-10-CM | POA: Insufficient documentation

## 2013-02-14 DIAGNOSIS — K219 Gastro-esophageal reflux disease without esophagitis: Secondary | ICD-10-CM | POA: Insufficient documentation

## 2013-02-14 DIAGNOSIS — J45909 Unspecified asthma, uncomplicated: Secondary | ICD-10-CM | POA: Insufficient documentation

## 2013-02-14 DIAGNOSIS — Z862 Personal history of diseases of the blood and blood-forming organs and certain disorders involving the immune mechanism: Secondary | ICD-10-CM | POA: Insufficient documentation

## 2013-02-14 DIAGNOSIS — R059 Cough, unspecified: Secondary | ICD-10-CM | POA: Insufficient documentation

## 2013-02-14 DIAGNOSIS — I10 Essential (primary) hypertension: Secondary | ICD-10-CM | POA: Diagnosis not present

## 2013-02-14 DIAGNOSIS — R05 Cough: Secondary | ICD-10-CM

## 2013-02-14 NOTE — ED Notes (Signed)
Pt reports "rt lung pain" that was seen yesterday for same at this facility - pt w/ hx of asthma - states she has used her home inhaler w/o relief. Pt speaking complete sentences, tearful on assessment. Lung sounds clear and equal bilat.

## 2013-02-15 ENCOUNTER — Emergency Department (HOSPITAL_BASED_OUTPATIENT_CLINIC_OR_DEPARTMENT_OTHER): Payer: Medicare Other

## 2013-02-15 DIAGNOSIS — R059 Cough, unspecified: Secondary | ICD-10-CM | POA: Diagnosis not present

## 2013-02-15 DIAGNOSIS — R0602 Shortness of breath: Secondary | ICD-10-CM | POA: Diagnosis not present

## 2013-02-15 DIAGNOSIS — R05 Cough: Secondary | ICD-10-CM | POA: Diagnosis not present

## 2013-02-15 NOTE — ED Notes (Signed)
Patient transported to X-ray 

## 2013-02-15 NOTE — ED Notes (Signed)
MD at bedside. 
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
## 2013-02-16 IMAGING — CT CT ABD-PELV W/ CM
1 of 3 series · 14 of 32 positions shown, 19 images · IV contrast (100 ML OMNI 300)
Comparison: CT abdomen 01/25/2010 prior

CLINICAL DATA: Mid abdominal pain radiating to flank.

CT ABDOMEN AND PELVIS WITH CONTRAST
TECHNIQUE: Multidetector CT imaging of the abdomen and pelvis was
performed following the standard protocol during bolus
administration of intravenous contrast.
Contrast: 100mL OMNIPAQUE IOHEXOL 300 MG/ML  SOLN

[Series 2: abd/pel with · axial · 0.71mm/px · z∈[-431,-16]mm · 14 of 93 slices shown, 19 images]
[im 5/93  soft-tissue]
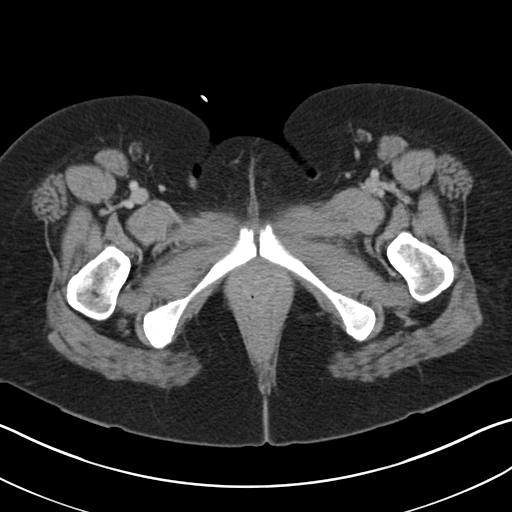
[im 5/93  bone]
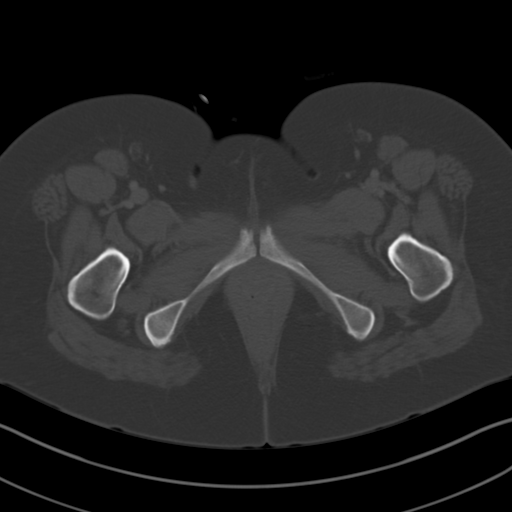
[im 14/93  soft-tissue]
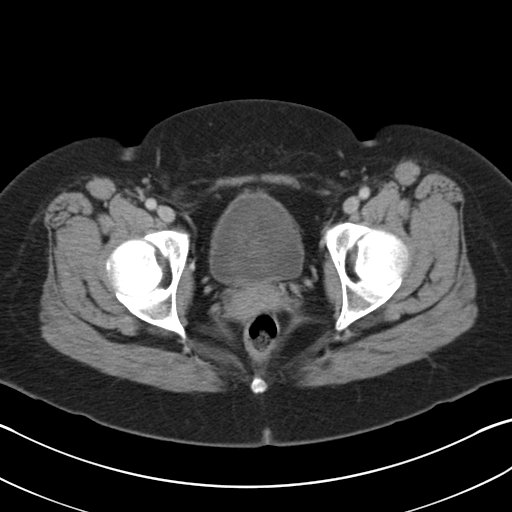
[im 19/93  soft-tissue]
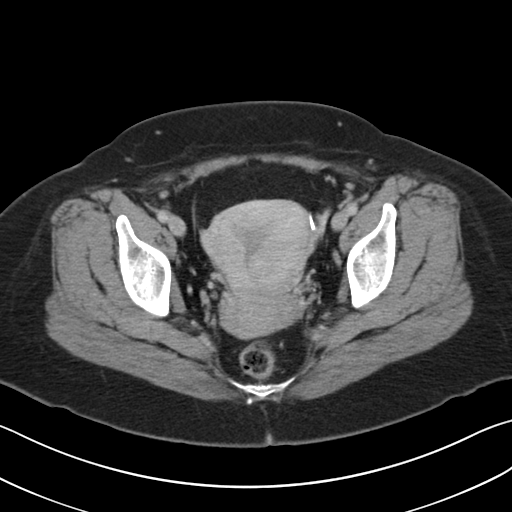
[im 28/93  soft-tissue]
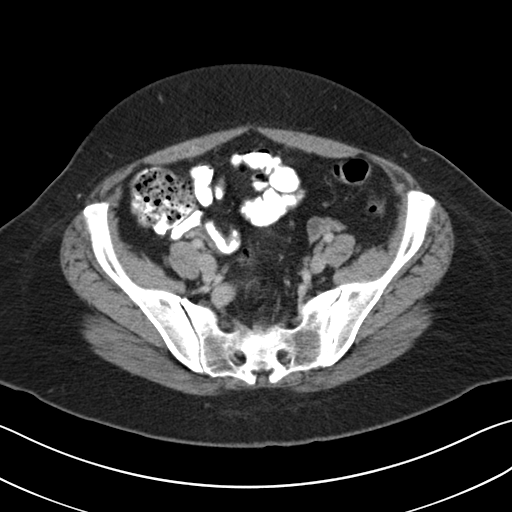
[im 33/93  soft-tissue]
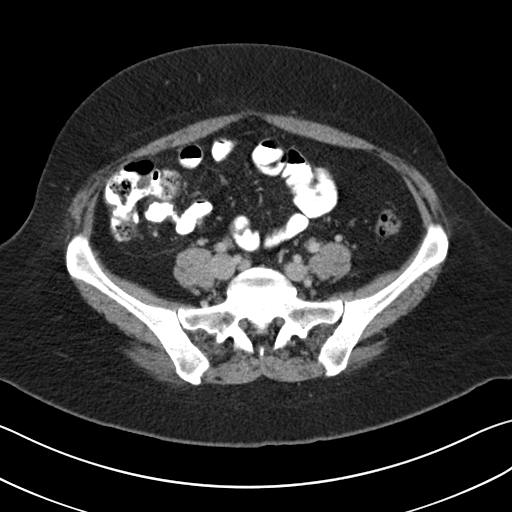
[im 42/93  soft-tissue]
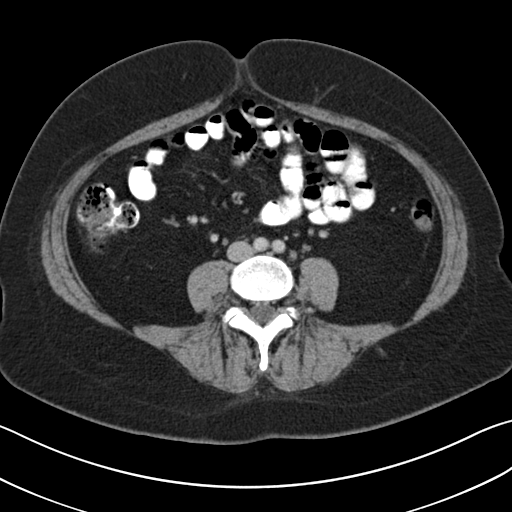
[im 47/93  soft-tissue]
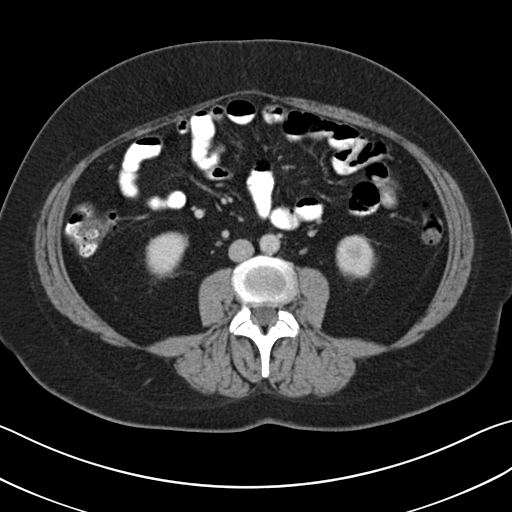
[im 51/93  soft-tissue]
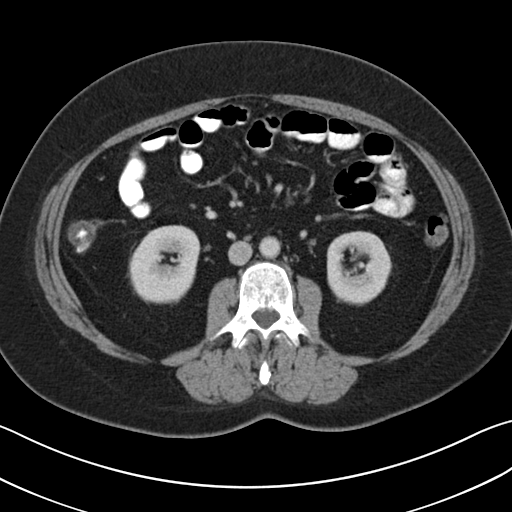
[im 60/93  soft-tissue]
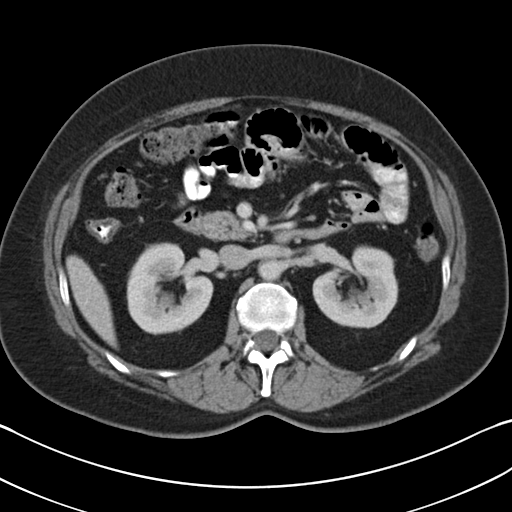
[im 60/93  bone]
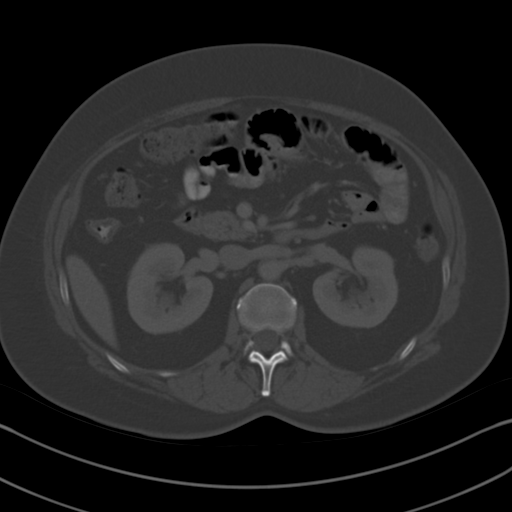
[im 65/93  soft-tissue]
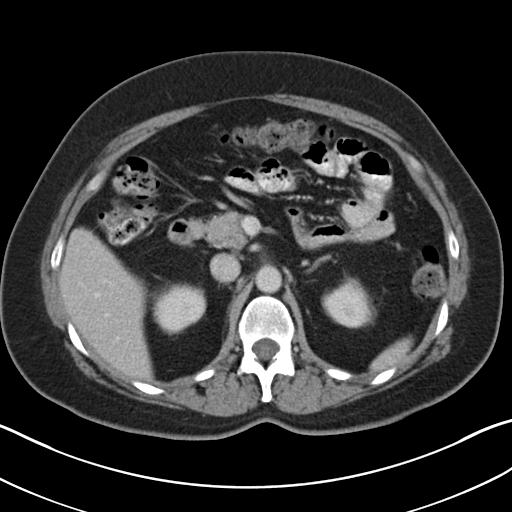
[im 74/93  soft-tissue]
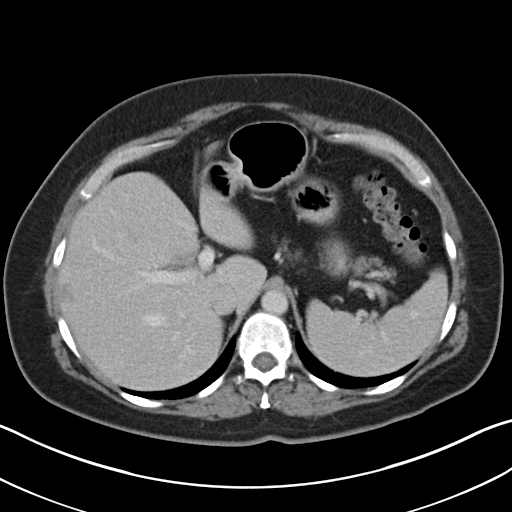
[im 74/93  lung]
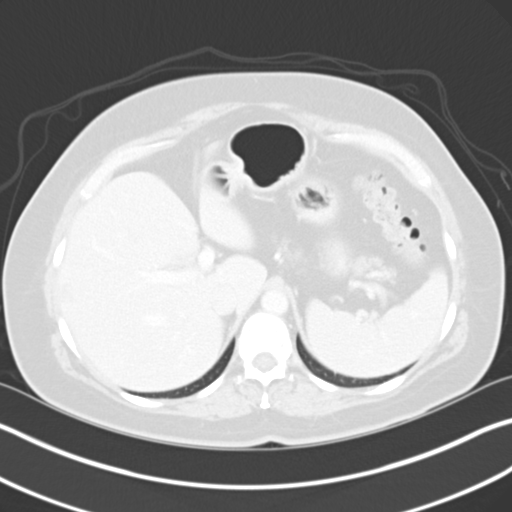
[im 79/93  soft-tissue]
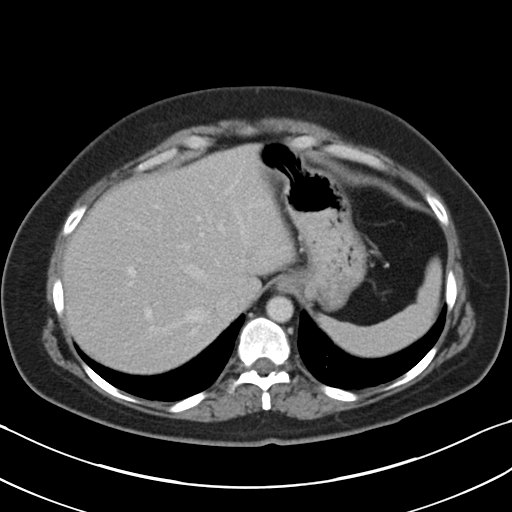
[im 79/93  lung]
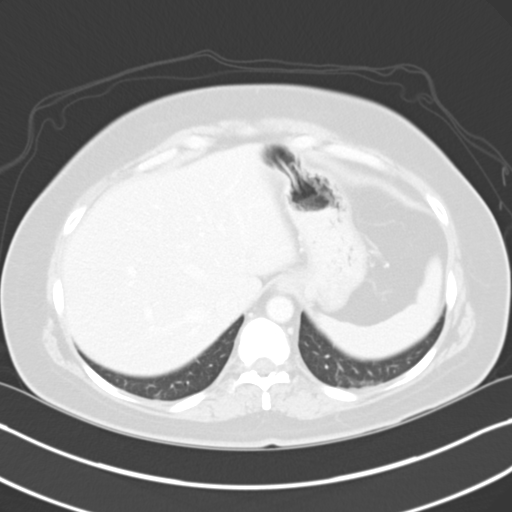
[im 83/93  lung]
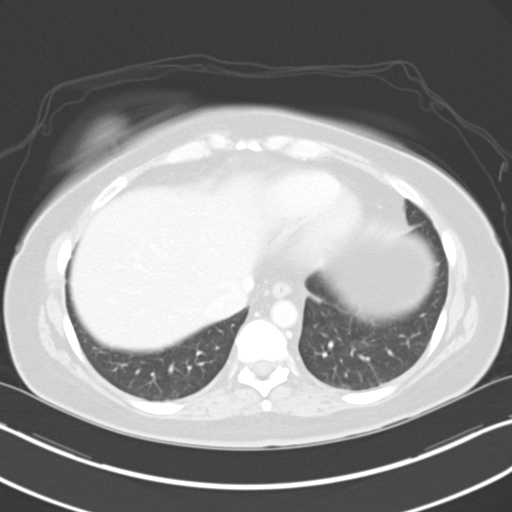
[im 88/93  soft-tissue]
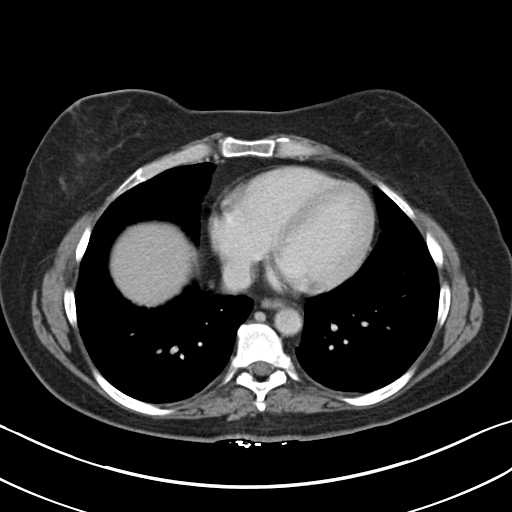
[im 88/93  lung]
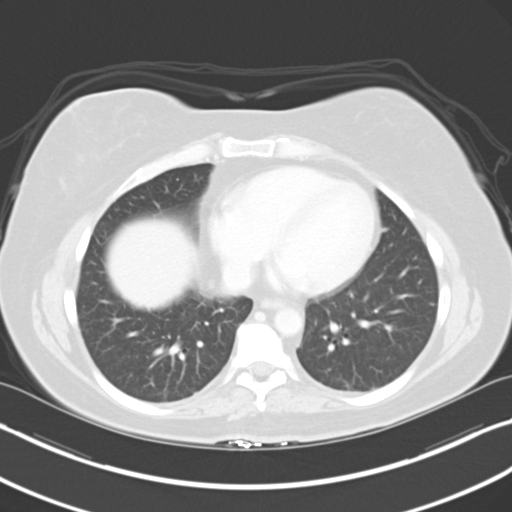

[14 of 32 positions shown; findings below may reference images not displayed]

FINDINGS: Lung bases are clear.  No pericardial fluid.

There is enhancing round lesion within the subcapsular right
hepatic lobe measuring 17 mm which is unchanged from 18 mm on
prior.  The lesion likely represents a benign hemangioma.  No
gallbladder abnormality.  The pancreas, spleen, adrenal gland, and
kidneys are normal. There is a new 5 mm cyst extend from the
posterior left kidney (image 35).  This appears to have simple
fluid attenuation on the delayed scan but cannot be fully
characterize.  There is no evidence of ureterolithiasis or
obstructive uropathy.

The stomach, small bowel, and colon are normal.

Abdominal aorta normal caliber.  No retroperitoneal periportal
lymphadenopathy.

No free fluid the pelvis.  There  are leiomyoma within the uterus.
Tubal ligation clips noted.  No pelvic lymphadenopathy.  The cervix
is thickened but similar to prior. Review of  bone windows
demonstrates no aggressive osseous lesions.
IMPRESSION: 1..  No acute abdominal or pelvic findings.
2.  Stable likely hemangioma within the liver.
3.  No evidence of ureterolithiasis or obstructive uropathy.
4.  Prominent cervix is unchanged from prior.  Recommend Pap smear
if the patient is not current.
5..  Small cystic lesion extend from the mid right kidney is new
from prior Recommend follow-up renal ultrasound in 6 to 12 months.

## 2013-02-17 ENCOUNTER — Telehealth: Payer: Self-pay | Admitting: *Deleted

## 2013-02-17 DIAGNOSIS — R002 Palpitations: Secondary | ICD-10-CM | POA: Diagnosis not present

## 2013-02-17 DIAGNOSIS — R0602 Shortness of breath: Secondary | ICD-10-CM

## 2013-02-17 DIAGNOSIS — Z79899 Other long term (current) drug therapy: Secondary | ICD-10-CM | POA: Diagnosis not present

## 2013-02-17 DIAGNOSIS — Z711 Person with feared health complaint in whom no diagnosis is made: Secondary | ICD-10-CM | POA: Diagnosis not present

## 2013-02-17 DIAGNOSIS — R059 Cough, unspecified: Secondary | ICD-10-CM | POA: Diagnosis not present

## 2013-02-17 DIAGNOSIS — R42 Dizziness and giddiness: Secondary | ICD-10-CM | POA: Diagnosis not present

## 2013-02-17 DIAGNOSIS — R079 Chest pain, unspecified: Secondary | ICD-10-CM | POA: Diagnosis not present

## 2013-02-17 DIAGNOSIS — R05 Cough: Secondary | ICD-10-CM | POA: Diagnosis not present

## 2013-02-17 DIAGNOSIS — F4310 Post-traumatic stress disorder, unspecified: Secondary | ICD-10-CM | POA: Diagnosis not present

## 2013-02-17 NOTE — Telephone Encounter (Signed)
Pt calls and states that something is goin on with her- she is SOB and when up and walks her O2 drops, feels "off" in the head, has asthma she wants blood gases drawn on her at [facility]). States she has been everywhere and she is tired of going in these hospitals and everyoner is thinking she is crazy.

## 2013-02-18 ENCOUNTER — Emergency Department (HOSPITAL_BASED_OUTPATIENT_CLINIC_OR_DEPARTMENT_OTHER)
Admission: EM | Admit: 2013-02-18 | Discharge: 2013-02-18 | Disposition: A | Discharge status: Home / Self Care | Payer: Medicare Other | Attending: Emergency Medicine | Admitting: Emergency Medicine

## 2013-02-18 ENCOUNTER — Encounter (HOSPITAL_BASED_OUTPATIENT_CLINIC_OR_DEPARTMENT_OTHER): Payer: Self-pay | Admitting: *Deleted

## 2013-02-18 DIAGNOSIS — H9209 Otalgia, unspecified ear: Secondary | ICD-10-CM | POA: Diagnosis not present

## 2013-02-18 DIAGNOSIS — K219 Gastro-esophageal reflux disease without esophagitis: Secondary | ICD-10-CM | POA: Insufficient documentation

## 2013-02-18 DIAGNOSIS — I10 Essential (primary) hypertension: Secondary | ICD-10-CM | POA: Insufficient documentation

## 2013-02-18 DIAGNOSIS — Z880 Allergy status to penicillin: Secondary | ICD-10-CM | POA: Diagnosis not present

## 2013-02-18 DIAGNOSIS — R059 Cough, unspecified: Secondary | ICD-10-CM | POA: Diagnosis not present

## 2013-02-18 DIAGNOSIS — G40909 Epilepsy, unspecified, not intractable, without status epilepticus: Secondary | ICD-10-CM | POA: Diagnosis not present

## 2013-02-18 DIAGNOSIS — R51 Headache: Secondary | ICD-10-CM | POA: Insufficient documentation

## 2013-02-18 DIAGNOSIS — G4730 Sleep apnea, unspecified: Secondary | ICD-10-CM | POA: Insufficient documentation

## 2013-02-18 DIAGNOSIS — R0602 Shortness of breath: Secondary | ICD-10-CM | POA: Diagnosis not present

## 2013-02-18 DIAGNOSIS — Z8679 Personal history of other diseases of the circulatory system: Secondary | ICD-10-CM | POA: Diagnosis not present

## 2013-02-18 DIAGNOSIS — R569 Unspecified convulsions: Secondary | ICD-10-CM | POA: Diagnosis not present

## 2013-02-18 DIAGNOSIS — Z8742 Personal history of other diseases of the female genital tract: Secondary | ICD-10-CM | POA: Diagnosis not present

## 2013-02-18 DIAGNOSIS — R109 Unspecified abdominal pain: Secondary | ICD-10-CM | POA: Insufficient documentation

## 2013-02-18 DIAGNOSIS — Z862 Personal history of diseases of the blood and blood-forming organs and certain disorders involving the immune mechanism: Secondary | ICD-10-CM | POA: Diagnosis not present

## 2013-02-18 DIAGNOSIS — J45909 Unspecified asthma, uncomplicated: Secondary | ICD-10-CM | POA: Diagnosis not present

## 2013-02-18 DIAGNOSIS — Z87891 Personal history of nicotine dependence: Secondary | ICD-10-CM | POA: Diagnosis not present

## 2013-02-18 DIAGNOSIS — R42 Dizziness and giddiness: Secondary | ICD-10-CM

## 2013-02-18 DIAGNOSIS — Z8639 Personal history of other endocrine, nutritional and metabolic disease: Secondary | ICD-10-CM | POA: Insufficient documentation

## 2013-02-18 DIAGNOSIS — F411 Generalized anxiety disorder: Secondary | ICD-10-CM | POA: Diagnosis not present

## 2013-02-18 DIAGNOSIS — R5381 Other malaise: Secondary | ICD-10-CM | POA: Insufficient documentation

## 2013-02-18 DIAGNOSIS — R0789 Other chest pain: Secondary | ICD-10-CM | POA: Diagnosis not present

## 2013-02-18 DIAGNOSIS — M549 Dorsalgia, unspecified: Secondary | ICD-10-CM | POA: Insufficient documentation

## 2013-02-18 DIAGNOSIS — R05 Cough: Secondary | ICD-10-CM | POA: Insufficient documentation

## 2013-02-18 DIAGNOSIS — R5383 Other fatigue: Secondary | ICD-10-CM | POA: Insufficient documentation

## 2013-02-18 DIAGNOSIS — J309 Allergic rhinitis, unspecified: Secondary | ICD-10-CM | POA: Diagnosis not present

## 2013-02-18 LAB — URINALYSIS, ROUTINE W REFLEX MICROSCOPIC
Bilirubin Urine: NEGATIVE
Glucose, UA: NEGATIVE mg/dL
Hgb urine dipstick: NEGATIVE
Ketones, ur: NEGATIVE mg/dL
Leukocytes, UA: NEGATIVE
Nitrite: NEGATIVE
Protein, ur: NEGATIVE mg/dL
Specific Gravity, Urine: 1.015 (ref 1.005–1.030)
Urobilinogen, UA: 0.2 mg/dL (ref 0.0–1.0)
pH: 7 (ref 5.0–8.0)

## 2013-02-18 NOTE — ED Notes (Signed)
Pt. Reports several times her Dr. Lenna Gilford a blood gas on her.  No blood gas ordered in the ED.  Pt. In no resp. Distress and has no noted skin issues.

## 2013-02-18 NOTE — Telephone Encounter (Signed)
She's never really had issues with low oxygen here in the office but I did go ahead and put in an order for an ABG and she can have that done [facility].
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
## 2013-02-18 NOTE — Telephone Encounter (Signed)
Pt notified lab order sent to [facility]. Barry Dienes, LPN

## 2013-02-18 NOTE — ED Notes (Addendum)
Dizziness and weakness. States it could be sinus issues. Last visit she got Afrin per patient. She is in no distress. Very talkative. Had allergy testing this am. States if she had gone to her doctor they probable would have given her IV fluids. She is scheduled for a stress test in the morning. States she was suppose to get a rx for an epi pen but never got it filled.

## 2013-02-18 NOTE — ED Notes (Signed)
Patient in Radiology requesting ABG drawn.  Dr. Felicita Gage office notified of request and was told that an order had been faxed to [facility] lab.  MD office contact was Barry Dienes.  [facility] unable to find order.  MD's office notified of same an was asked to fax order again.

## 2013-02-19 DIAGNOSIS — R0602 Shortness of breath: Secondary | ICD-10-CM | POA: Diagnosis not present

## 2013-02-19 DIAGNOSIS — H532 Diplopia: Secondary | ICD-10-CM | POA: Diagnosis not present

## 2013-02-19 DIAGNOSIS — G839 Paralytic syndrome, unspecified: Secondary | ICD-10-CM | POA: Diagnosis not present

## 2013-02-19 DIAGNOSIS — Z79899 Other long term (current) drug therapy: Secondary | ICD-10-CM | POA: Diagnosis not present

## 2013-02-19 DIAGNOSIS — I10 Essential (primary) hypertension: Secondary | ICD-10-CM | POA: Diagnosis not present

## 2013-02-19 DIAGNOSIS — R42 Dizziness and giddiness: Secondary | ICD-10-CM | POA: Diagnosis not present

## 2013-02-19 DIAGNOSIS — R5383 Other fatigue: Secondary | ICD-10-CM | POA: Diagnosis not present

## 2013-02-19 DIAGNOSIS — R5381 Other malaise: Secondary | ICD-10-CM | POA: Diagnosis not present

## 2013-02-19 DIAGNOSIS — H538 Other visual disturbances: Secondary | ICD-10-CM | POA: Diagnosis not present

## 2013-02-19 DIAGNOSIS — H539 Unspecified visual disturbance: Secondary | ICD-10-CM | POA: Diagnosis not present

## 2013-02-19 DIAGNOSIS — I498 Other specified cardiac arrhythmias: Secondary | ICD-10-CM | POA: Diagnosis not present

## 2013-02-19 DIAGNOSIS — R0789 Other chest pain: Secondary | ICD-10-CM | POA: Diagnosis not present

## 2013-02-20 DIAGNOSIS — N8501 Benign endometrial hyperplasia: Secondary | ICD-10-CM | POA: Insufficient documentation

## 2013-02-21 ENCOUNTER — Encounter (HOSPITAL_BASED_OUTPATIENT_CLINIC_OR_DEPARTMENT_OTHER): Payer: Self-pay

## 2013-02-21 ENCOUNTER — Emergency Department (HOSPITAL_BASED_OUTPATIENT_CLINIC_OR_DEPARTMENT_OTHER): Payer: Medicare Other

## 2013-02-21 ENCOUNTER — Emergency Department (HOSPITAL_BASED_OUTPATIENT_CLINIC_OR_DEPARTMENT_OTHER)
Admission: EM | Admit: 2013-02-21 | Discharge: 2013-02-21 | Disposition: A | Discharge status: Home / Self Care | Payer: Medicare Other | Attending: Emergency Medicine | Admitting: Emergency Medicine

## 2013-02-21 DIAGNOSIS — J45909 Unspecified asthma, uncomplicated: Secondary | ICD-10-CM | POA: Insufficient documentation

## 2013-02-21 DIAGNOSIS — Z01818 Encounter for other preprocedural examination: Secondary | ICD-10-CM | POA: Diagnosis not present

## 2013-02-21 DIAGNOSIS — Z9861 Coronary angioplasty status: Secondary | ICD-10-CM | POA: Insufficient documentation

## 2013-02-21 DIAGNOSIS — Z8742 Personal history of other diseases of the female genital tract: Secondary | ICD-10-CM | POA: Insufficient documentation

## 2013-02-21 DIAGNOSIS — Z8679 Personal history of other diseases of the circulatory system: Secondary | ICD-10-CM | POA: Insufficient documentation

## 2013-02-21 DIAGNOSIS — K5900 Constipation, unspecified: Secondary | ICD-10-CM | POA: Diagnosis not present

## 2013-02-21 DIAGNOSIS — Z8669 Personal history of other diseases of the nervous system and sense organs: Secondary | ICD-10-CM | POA: Diagnosis not present

## 2013-02-21 DIAGNOSIS — Z3202 Encounter for pregnancy test, result negative: Secondary | ICD-10-CM | POA: Insufficient documentation

## 2013-02-21 DIAGNOSIS — Z79899 Other long term (current) drug therapy: Secondary | ICD-10-CM | POA: Insufficient documentation

## 2013-02-21 DIAGNOSIS — K219 Gastro-esophageal reflux disease without esophagitis: Secondary | ICD-10-CM | POA: Diagnosis not present

## 2013-02-21 DIAGNOSIS — Z8639 Personal history of other endocrine, nutritional and metabolic disease: Secondary | ICD-10-CM | POA: Insufficient documentation

## 2013-02-21 DIAGNOSIS — R109 Unspecified abdominal pain: Secondary | ICD-10-CM | POA: Insufficient documentation

## 2013-02-21 DIAGNOSIS — F4310 Post-traumatic stress disorder, unspecified: Secondary | ICD-10-CM | POA: Insufficient documentation

## 2013-02-21 DIAGNOSIS — R42 Dizziness and giddiness: Secondary | ICD-10-CM | POA: Diagnosis not present

## 2013-02-21 DIAGNOSIS — R112 Nausea with vomiting, unspecified: Secondary | ICD-10-CM | POA: Diagnosis not present

## 2013-02-21 DIAGNOSIS — Z880 Allergy status to penicillin: Secondary | ICD-10-CM | POA: Diagnosis not present

## 2013-02-21 DIAGNOSIS — Z9851 Tubal ligation status: Secondary | ICD-10-CM | POA: Insufficient documentation

## 2013-02-21 DIAGNOSIS — G4730 Sleep apnea, unspecified: Secondary | ICD-10-CM | POA: Diagnosis not present

## 2013-02-21 DIAGNOSIS — Z862 Personal history of diseases of the blood and blood-forming organs and certain disorders involving the immune mechanism: Secondary | ICD-10-CM | POA: Diagnosis not present

## 2013-02-21 DIAGNOSIS — Z87891 Personal history of nicotine dependence: Secondary | ICD-10-CM | POA: Insufficient documentation

## 2013-02-21 DIAGNOSIS — E559 Vitamin D deficiency, unspecified: Secondary | ICD-10-CM | POA: Insufficient documentation

## 2013-02-21 DIAGNOSIS — I10 Essential (primary) hypertension: Secondary | ICD-10-CM | POA: Diagnosis not present

## 2013-02-21 DIAGNOSIS — F411 Generalized anxiety disorder: Secondary | ICD-10-CM | POA: Insufficient documentation

## 2013-02-21 DIAGNOSIS — M25819 Other specified joint disorders, unspecified shoulder: Secondary | ICD-10-CM | POA: Diagnosis not present

## 2013-02-21 LAB — CBC WITH DIFFERENTIAL/PLATELET
Basophils Absolute: 0 10*3/uL (ref 0.0–0.1)
Basophils Relative: 0 % (ref 0–1)
Eosinophils Absolute: 0.2 10*3/uL (ref 0.0–0.7)
Eosinophils Relative: 3 % (ref 0–5)
HCT: 38.9 % (ref 36.0–46.0)
Hemoglobin: 13.2 g/dL (ref 12.0–15.0)
Lymphocytes Relative: 26 % (ref 12–46)
Lymphs Abs: 1.8 10*3/uL (ref 0.7–4.0)
MCH: 30.2 pg (ref 26.0–34.0)
MCHC: 33.9 g/dL (ref 30.0–36.0)
MCV: 89 fL (ref 78.0–100.0)
Monocytes Absolute: 0.7 10*3/uL (ref 0.1–1.0)
Monocytes Relative: 10 % (ref 3–12)
Neutro Abs: 4.2 10*3/uL (ref 1.7–7.7)
Neutrophils Relative %: 61 % (ref 43–77)
Platelets: 159 10*3/uL (ref 150–400)
RBC: 4.37 MIL/uL (ref 3.87–5.11)
RDW: 12.9 % (ref 11.5–15.5)
WBC: 6.9 10*3/uL (ref 4.0–10.5)

## 2013-02-21 LAB — COMPREHENSIVE METABOLIC PANEL
ALT: 26 U/L (ref 0–35)
AST: 16 U/L (ref 0–37)
Albumin: 3.7 g/dL (ref 3.5–5.2)
Alkaline Phosphatase: 56 U/L (ref 39–117)
BUN: 8 mg/dL (ref 6–23)
CO2: 25 mEq/L (ref 19–32)
Calcium: 9.3 mg/dL (ref 8.4–10.5)
Chloride: 103 mEq/L (ref 96–112)
Creatinine, Ser: 0.5 mg/dL (ref 0.50–1.10)
GFR calc Af Amer: >90 mL/min (ref >90)
GFR calc non Af Amer: >90 mL/min (ref >90)
Glucose, Bld: 82 mg/dL (ref 70–99)
Potassium: 3.6 mEq/L (ref 3.5–5.1)
Sodium: 139 mEq/L (ref 135–145)
Total Bilirubin: 0.2 mg/dL — ABNORMAL LOW (ref 0.3–1.2)
Total Protein: 6.9 g/dL (ref 6.0–8.3)

## 2013-02-21 LAB — URINALYSIS, ROUTINE W REFLEX MICROSCOPIC
Bilirubin Urine: NEGATIVE
Glucose, UA: NEGATIVE mg/dL
Hgb urine dipstick: NEGATIVE
Ketones, ur: NEGATIVE mg/dL
Leukocytes, UA: NEGATIVE
Nitrite: NEGATIVE
Protein, ur: NEGATIVE mg/dL
Specific Gravity, Urine: 1.03 (ref 1.005–1.030)
Urobilinogen, UA: 0.2 mg/dL (ref 0.0–1.0)
pH: 5.5 (ref 5.0–8.0)

## 2013-02-21 LAB — PREGNANCY, URINE: Preg Test, Ur: NEGATIVE

## 2013-02-21 LAB — LIPASE, BLOOD: Lipase: 42 U/L (ref 11–59)

## 2013-02-21 MED ORDER — SODIUM CHLORIDE 0.9 % IV BOLUS (SEPSIS)
1000.0000 mL | Freq: Once | INTRAVENOUS | Status: AC
  Administered 2013-02-21: 1000 mL via INTRAVENOUS

## 2013-02-21 NOTE — ED Notes (Addendum)
Pt reports a 2 week history of abdominal pain, nausea, light headedness and feels like she is dehydrated.  Seen here last week for the same. Pt states she is orthostatic and needs IV fluids.
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
## 2013-02-24 ENCOUNTER — Telehealth: Payer: Self-pay | Admitting: Family Medicine

## 2013-02-24 DIAGNOSIS — Z882 Allergy status to sulfonamides status: Secondary | ICD-10-CM | POA: Diagnosis not present

## 2013-02-24 DIAGNOSIS — R1084 Generalized abdominal pain: Secondary | ICD-10-CM | POA: Diagnosis not present

## 2013-02-24 DIAGNOSIS — R51 Headache: Secondary | ICD-10-CM | POA: Diagnosis not present

## 2013-02-24 DIAGNOSIS — I10 Essential (primary) hypertension: Secondary | ICD-10-CM | POA: Diagnosis not present

## 2013-02-24 DIAGNOSIS — G4733 Obstructive sleep apnea (adult) (pediatric): Secondary | ICD-10-CM | POA: Diagnosis not present

## 2013-02-24 DIAGNOSIS — D509 Iron deficiency anemia, unspecified: Secondary | ICD-10-CM | POA: Diagnosis not present

## 2013-02-24 DIAGNOSIS — Z880 Allergy status to penicillin: Secondary | ICD-10-CM | POA: Diagnosis not present

## 2013-02-24 DIAGNOSIS — Z862 Personal history of diseases of the blood and blood-forming organs and certain disorders involving the immune mechanism: Secondary | ICD-10-CM | POA: Diagnosis not present

## 2013-02-24 DIAGNOSIS — Z888 Allergy status to other drugs, medicaments and biological substances status: Secondary | ICD-10-CM | POA: Diagnosis not present

## 2013-02-24 DIAGNOSIS — I6789 Other cerebrovascular disease: Secondary | ICD-10-CM | POA: Diagnosis not present

## 2013-02-24 DIAGNOSIS — Z79899 Other long term (current) drug therapy: Secondary | ICD-10-CM | POA: Diagnosis not present

## 2013-02-24 DIAGNOSIS — N8500 Endometrial hyperplasia, unspecified: Secondary | ICD-10-CM | POA: Diagnosis not present

## 2013-02-24 DIAGNOSIS — R42 Dizziness and giddiness: Secondary | ICD-10-CM | POA: Diagnosis not present

## 2013-02-24 DIAGNOSIS — Z885 Allergy status to narcotic agent status: Secondary | ICD-10-CM | POA: Diagnosis not present

## 2013-02-24 DIAGNOSIS — K219 Gastro-esophageal reflux disease without esophagitis: Secondary | ICD-10-CM | POA: Diagnosis not present

## 2013-02-24 DIAGNOSIS — J45909 Unspecified asthma, uncomplicated: Secondary | ICD-10-CM | POA: Diagnosis not present

## 2013-02-24 DIAGNOSIS — F411 Generalized anxiety disorder: Secondary | ICD-10-CM | POA: Diagnosis not present

## 2013-02-24 DIAGNOSIS — Z7902 Long term (current) use of antithrombotics/antiplatelets: Secondary | ICD-10-CM | POA: Diagnosis not present

## 2013-02-24 DIAGNOSIS — Z881 Allergy status to other antibiotic agents status: Secondary | ICD-10-CM | POA: Diagnosis not present

## 2013-02-24 DIAGNOSIS — Z7982 Long term (current) use of aspirin: Secondary | ICD-10-CM | POA: Diagnosis not present

## 2013-02-24 NOTE — Telephone Encounter (Signed)
Called pt left detailed message .Loralee Pacas [place]

## 2013-02-24 NOTE — Telephone Encounter (Signed)
Please let her know that her arterial blood gas was essentially normal. Her PCO2 was just borderline. This can sometimes be from mild hyperventilation. It is not in a concerning level.

## 2013-02-25 DIAGNOSIS — F339 Major depressive disorder, recurrent, unspecified: Secondary | ICD-10-CM | POA: Diagnosis not present

## 2013-02-26 ENCOUNTER — Encounter: Payer: Self-pay | Admitting: Family Medicine

## 2013-02-26 ENCOUNTER — Ambulatory Visit (INDEPENDENT_AMBULATORY_CARE_PROVIDER_SITE_OTHER): Payer: Medicare Other | Admitting: Family Medicine

## 2013-02-26 VITALS — BP 143/88 | HR 92 | Wt 185.0 lb

## 2013-02-26 DIAGNOSIS — R599 Enlarged lymph nodes, unspecified: Secondary | ICD-10-CM

## 2013-02-26 DIAGNOSIS — R109 Unspecified abdominal pain: Secondary | ICD-10-CM | POA: Diagnosis not present

## 2013-02-26 DIAGNOSIS — J309 Allergic rhinitis, unspecified: Secondary | ICD-10-CM | POA: Diagnosis not present

## 2013-02-26 DIAGNOSIS — E860 Dehydration: Secondary | ICD-10-CM

## 2013-02-26 DIAGNOSIS — Z87828 Personal history of other (healed) physical injury and trauma: Secondary | ICD-10-CM | POA: Insufficient documentation

## 2013-02-26 DIAGNOSIS — R591 Generalized enlarged lymph nodes: Secondary | ICD-10-CM

## 2013-02-26 MED ORDER — CEPHALEXIN 250 MG PO CAPS: 250.0000 mg | ORAL_CAPSULE | Freq: Three times a day (TID) | ORAL | Status: DC

## 2013-02-26 NOTE — ED Provider Notes (Signed)
Medical screening examination/treatment/procedure(s) were performed by non-physician practitioner and as supervising physician I was immediately available for consultation/collaboration.   Carleene Cooper III, MD 02/26/13 1350
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
## 2013-02-27 DIAGNOSIS — F411 Generalized anxiety disorder: Secondary | ICD-10-CM | POA: Diagnosis not present

## 2013-02-27 DIAGNOSIS — K219 Gastro-esophageal reflux disease without esophagitis: Secondary | ICD-10-CM | POA: Diagnosis not present

## 2013-02-27 DIAGNOSIS — I10 Essential (primary) hypertension: Secondary | ICD-10-CM | POA: Diagnosis not present

## 2013-02-27 DIAGNOSIS — N8500 Endometrial hyperplasia, unspecified: Secondary | ICD-10-CM | POA: Diagnosis not present

## 2013-02-27 DIAGNOSIS — G4733 Obstructive sleep apnea (adult) (pediatric): Secondary | ICD-10-CM | POA: Diagnosis not present

## 2013-02-27 DIAGNOSIS — C549 Malignant neoplasm of corpus uteri, unspecified: Secondary | ICD-10-CM | POA: Diagnosis not present

## 2013-02-27 DIAGNOSIS — Z79899 Other long term (current) drug therapy: Secondary | ICD-10-CM | POA: Diagnosis not present

## 2013-02-27 DIAGNOSIS — J45909 Unspecified asthma, uncomplicated: Secondary | ICD-10-CM | POA: Diagnosis not present

## 2013-02-27 DIAGNOSIS — D509 Iron deficiency anemia, unspecified: Secondary | ICD-10-CM | POA: Diagnosis not present

## 2013-02-27 DIAGNOSIS — N8501 Benign endometrial hyperplasia: Secondary | ICD-10-CM | POA: Diagnosis not present

## 2013-02-28 DIAGNOSIS — R0609 Other forms of dyspnea: Secondary | ICD-10-CM | POA: Diagnosis not present

## 2013-02-28 DIAGNOSIS — J029 Acute pharyngitis, unspecified: Secondary | ICD-10-CM | POA: Diagnosis not present

## 2013-02-28 DIAGNOSIS — R093 Abnormal sputum: Secondary | ICD-10-CM | POA: Diagnosis not present

## 2013-02-28 DIAGNOSIS — R0602 Shortness of breath: Secondary | ICD-10-CM | POA: Diagnosis not present

## 2013-02-28 DIAGNOSIS — R9431 Abnormal electrocardiogram [ECG] [EKG]: Secondary | ICD-10-CM | POA: Diagnosis not present

## 2013-02-28 DIAGNOSIS — R070 Pain in throat: Secondary | ICD-10-CM | POA: Diagnosis not present

## 2013-02-28 DIAGNOSIS — R0789 Other chest pain: Secondary | ICD-10-CM | POA: Diagnosis not present

## 2013-02-28 DIAGNOSIS — Z79899 Other long term (current) drug therapy: Secondary | ICD-10-CM | POA: Diagnosis not present

## 2013-02-28 DIAGNOSIS — Z9889 Other specified postprocedural states: Secondary | ICD-10-CM | POA: Diagnosis not present

## 2013-02-28 DIAGNOSIS — R059 Cough, unspecified: Secondary | ICD-10-CM | POA: Diagnosis not present

## 2013-03-02 DIAGNOSIS — G40909 Epilepsy, unspecified, not intractable, without status epilepticus: Secondary | ICD-10-CM | POA: Diagnosis not present

## 2013-03-02 DIAGNOSIS — D649 Anemia, unspecified: Secondary | ICD-10-CM | POA: Diagnosis not present

## 2013-03-02 DIAGNOSIS — Z886 Allergy status to analgesic agent status: Secondary | ICD-10-CM | POA: Diagnosis not present

## 2013-03-02 DIAGNOSIS — F411 Generalized anxiety disorder: Secondary | ICD-10-CM | POA: Diagnosis not present

## 2013-03-02 DIAGNOSIS — R0989 Other specified symptoms and signs involving the circulatory and respiratory systems: Secondary | ICD-10-CM | POA: Diagnosis not present

## 2013-03-02 DIAGNOSIS — R0609 Other forms of dyspnea: Secondary | ICD-10-CM | POA: Diagnosis not present

## 2013-03-02 DIAGNOSIS — Z888 Allergy status to other drugs, medicaments and biological substances status: Secondary | ICD-10-CM | POA: Diagnosis not present

## 2013-03-02 DIAGNOSIS — R070 Pain in throat: Secondary | ICD-10-CM | POA: Diagnosis not present

## 2013-03-02 DIAGNOSIS — Z880 Allergy status to penicillin: Secondary | ICD-10-CM | POA: Diagnosis not present

## 2013-03-02 DIAGNOSIS — Z79899 Other long term (current) drug therapy: Secondary | ICD-10-CM | POA: Diagnosis not present

## 2013-03-02 DIAGNOSIS — G4730 Sleep apnea, unspecified: Secondary | ICD-10-CM | POA: Diagnosis not present

## 2013-03-02 DIAGNOSIS — R000 Tachycardia, unspecified: Secondary | ICD-10-CM | POA: Diagnosis not present

## 2013-03-02 DIAGNOSIS — Z883 Allergy status to other anti-infective agents status: Secondary | ICD-10-CM | POA: Diagnosis not present

## 2013-03-02 DIAGNOSIS — R0602 Shortness of breath: Secondary | ICD-10-CM | POA: Diagnosis not present

## 2013-03-02 DIAGNOSIS — Z9889 Other specified postprocedural states: Secondary | ICD-10-CM | POA: Diagnosis not present

## 2013-03-03 DIAGNOSIS — F339 Major depressive disorder, recurrent, unspecified: Secondary | ICD-10-CM | POA: Diagnosis not present

## 2013-03-03 DIAGNOSIS — J45909 Unspecified asthma, uncomplicated: Secondary | ICD-10-CM | POA: Diagnosis not present

## 2013-03-04 ENCOUNTER — Telehealth: Payer: Self-pay | Admitting: Family Medicine

## 2013-03-04 ENCOUNTER — Encounter (HOSPITAL_BASED_OUTPATIENT_CLINIC_OR_DEPARTMENT_OTHER): Payer: Self-pay | Admitting: *Deleted

## 2013-03-04 ENCOUNTER — Encounter: Payer: Self-pay | Admitting: Family Medicine

## 2013-03-04 ENCOUNTER — Emergency Department (HOSPITAL_BASED_OUTPATIENT_CLINIC_OR_DEPARTMENT_OTHER)
Admission: EM | Admit: 2013-03-04 | Discharge: 2013-03-04 | Disposition: A | Discharge status: Home / Self Care | Payer: Medicare Other | Attending: Emergency Medicine | Admitting: Emergency Medicine

## 2013-03-04 DIAGNOSIS — I10 Essential (primary) hypertension: Secondary | ICD-10-CM | POA: Diagnosis not present

## 2013-03-04 DIAGNOSIS — J45909 Unspecified asthma, uncomplicated: Secondary | ICD-10-CM | POA: Diagnosis not present

## 2013-03-04 DIAGNOSIS — Z880 Allergy status to penicillin: Secondary | ICD-10-CM | POA: Diagnosis not present

## 2013-03-04 DIAGNOSIS — G4730 Sleep apnea, unspecified: Secondary | ICD-10-CM | POA: Diagnosis not present

## 2013-03-04 DIAGNOSIS — R195 Other fecal abnormalities: Secondary | ICD-10-CM

## 2013-03-04 DIAGNOSIS — Z8709 Personal history of other diseases of the respiratory system: Secondary | ICD-10-CM | POA: Diagnosis not present

## 2013-03-04 DIAGNOSIS — Z8659 Personal history of other mental and behavioral disorders: Secondary | ICD-10-CM | POA: Diagnosis not present

## 2013-03-04 DIAGNOSIS — Z79899 Other long term (current) drug therapy: Secondary | ICD-10-CM | POA: Insufficient documentation

## 2013-03-04 DIAGNOSIS — Z8719 Personal history of other diseases of the digestive system: Secondary | ICD-10-CM | POA: Insufficient documentation

## 2013-03-04 DIAGNOSIS — Z87891 Personal history of nicotine dependence: Secondary | ICD-10-CM | POA: Diagnosis not present

## 2013-03-04 DIAGNOSIS — Z8742 Personal history of other diseases of the female genital tract: Secondary | ICD-10-CM | POA: Insufficient documentation

## 2013-03-04 DIAGNOSIS — B9689 Other specified bacterial agents as the cause of diseases classified elsewhere: Secondary | ICD-10-CM

## 2013-03-04 DIAGNOSIS — Z8739 Personal history of other diseases of the musculoskeletal system and connective tissue: Secondary | ICD-10-CM | POA: Insufficient documentation

## 2013-03-04 DIAGNOSIS — Z862 Personal history of diseases of the blood and blood-forming organs and certain disorders involving the immune mechanism: Secondary | ICD-10-CM | POA: Diagnosis not present

## 2013-03-04 DIAGNOSIS — I498 Other specified cardiac arrhythmias: Secondary | ICD-10-CM | POA: Diagnosis not present

## 2013-03-04 DIAGNOSIS — Z8639 Personal history of other endocrine, nutritional and metabolic disease: Secondary | ICD-10-CM | POA: Insufficient documentation

## 2013-03-04 DIAGNOSIS — Z3202 Encounter for pregnancy test, result negative: Secondary | ICD-10-CM | POA: Insufficient documentation

## 2013-03-04 DIAGNOSIS — N760 Acute vaginitis: Secondary | ICD-10-CM | POA: Insufficient documentation

## 2013-03-04 DIAGNOSIS — F339 Major depressive disorder, recurrent, unspecified: Secondary | ICD-10-CM | POA: Diagnosis not present

## 2013-03-04 DIAGNOSIS — R51 Headache: Secondary | ICD-10-CM | POA: Insufficient documentation

## 2013-03-04 DIAGNOSIS — Z8669 Personal history of other diseases of the nervous system and sense organs: Secondary | ICD-10-CM | POA: Diagnosis not present

## 2013-03-04 DIAGNOSIS — A499 Bacterial infection, unspecified: Secondary | ICD-10-CM | POA: Diagnosis not present

## 2013-03-04 DIAGNOSIS — K921 Melena: Secondary | ICD-10-CM | POA: Diagnosis not present

## 2013-03-04 LAB — CBC WITH DIFFERENTIAL/PLATELET
Basophils Absolute: 0 10*3/uL (ref 0.0–0.1)
Basophils Relative: 0 % (ref 0–1)
Eosinophils Absolute: 0.2 10*3/uL (ref 0.0–0.7)
Eosinophils Relative: 2 % (ref 0–5)
HCT: 41.8 % (ref 36.0–46.0)
Hemoglobin: 13.9 g/dL (ref 12.0–15.0)
Lymphocytes Relative: 21 % (ref 12–46)
Lymphs Abs: 1.4 10*3/uL (ref 0.7–4.0)
MCH: 29.6 pg (ref 26.0–34.0)
MCHC: 33.3 g/dL (ref 30.0–36.0)
MCV: 88.9 fL (ref 78.0–100.0)
Monocytes Absolute: 0.7 10*3/uL (ref 0.1–1.0)
Monocytes Relative: 10 % (ref 3–12)
Neutro Abs: 4.5 10*3/uL (ref 1.7–7.7)
Neutrophils Relative %: 66 % (ref 43–77)
Platelets: 223 10*3/uL (ref 150–400)
RBC: 4.7 MIL/uL (ref 3.87–5.11)
RDW: 13.3 % (ref 11.5–15.5)
WBC: 6.8 10*3/uL (ref 4.0–10.5)

## 2013-03-04 LAB — URINALYSIS, ROUTINE W REFLEX MICROSCOPIC
Bilirubin Urine: NEGATIVE
Glucose, UA: NEGATIVE mg/dL
Ketones, ur: NEGATIVE mg/dL
Nitrite: NEGATIVE
Protein, ur: NEGATIVE mg/dL
Specific Gravity, Urine: 1.02 (ref 1.005–1.030)
Urobilinogen, UA: 0.2 mg/dL (ref 0.0–1.0)
pH: 6.5 (ref 5.0–8.0)

## 2013-03-04 LAB — COMPREHENSIVE METABOLIC PANEL
ALT: 21 U/L (ref 0–35)
AST: 13 U/L (ref 0–37)
Albumin: 3.9 g/dL (ref 3.5–5.2)
Alkaline Phosphatase: 62 U/L (ref 39–117)
BUN: 10 mg/dL (ref 6–23)
CO2: 21 mEq/L (ref 19–32)
Calcium: 9.5 mg/dL (ref 8.4–10.5)
Chloride: 105 mEq/L (ref 96–112)
Creatinine, Ser: 0.5 mg/dL (ref 0.50–1.10)
GFR calc Af Amer: >90 mL/min (ref >90)
GFR calc non Af Amer: >90 mL/min (ref >90)
Glucose, Bld: 102 mg/dL — ABNORMAL HIGH (ref 70–99)
Potassium: 4 mEq/L (ref 3.5–5.1)
Sodium: 138 mEq/L (ref 135–145)
Total Bilirubin: 0.1 mg/dL — ABNORMAL LOW (ref 0.3–1.2)
Total Protein: 7.3 g/dL (ref 6.0–8.3)

## 2013-03-04 LAB — WET PREP, GENITAL
Trich, Wet Prep: NONE SEEN
Yeast Wet Prep HPF POC: NONE SEEN

## 2013-03-04 LAB — URINE MICROSCOPIC-ADD ON

## 2013-03-04 LAB — FERRITIN: Ferritin: 102 ng/mL (ref 10–291)

## 2013-03-04 LAB — OCCULT BLOOD X 1 CARD TO LAB, STOOL: Fecal Occult Bld: POSITIVE — AB

## 2013-03-04 LAB — PREGNANCY, URINE: Preg Test, Ur: NEGATIVE

## 2013-03-04 MED ORDER — NYSTATIN 100000 UNIT/ML MT SUSP: 500000.0000 [IU] | Freq: Four times a day (QID) | OROMUCOSAL | Status: DC

## 2013-03-04 MED ORDER — CLINDAMYCIN HCL 300 MG PO CAPS: 300.0000 mg | ORAL_CAPSULE | Freq: Two times a day (BID) | ORAL | Status: DC

## 2013-03-04 NOTE — Telephone Encounter (Signed)
rx sent

## 2013-03-04 NOTE — ED Notes (Signed)
Abdominal pain. States she had a D&C last week. Has been have vaginal discharge and pain since.

## 2013-03-04 NOTE — ED Provider Notes (Signed)
Exam performed by Francee Piccolo L,  exam chaperoned Date: 03/04/2013 Pelvic exam: normal external genitalia without evidence of trauma. VULVA: normal appearing vulva with no masses, tenderness or lesion. VAGINA: normal appearing vagina with normal color and discharge, no lesions. CERVIX: normal appearing cervix without lesions, cervical motion tenderness absent, cervical os closed with out purulent discharge; vaginal discharge - foul and green, Wet prep and DNA probe for chlamydia and GC obtained.   ADNEXA: normal adnexa in size, nontender and no masses UTERUS: uterus is normal size, shape, consistency and nontender.   Chaperone was present. Patient without pain around the rectal area. There are no external fissures noted. No induration of the skin or swelling. No external hemorrhoids seen. Patient able to tolerate examination. I was able to feel the first 2-3cm of the rectum digitally without gross abnormality. There is no gross blood.  NO signs of perirectal abscess.    Jeannetta Ellis, PA-C 03/04/13 1633

## 2013-03-04 NOTE — Telephone Encounter (Signed)
The nystatin swish

## 2013-03-04 NOTE — Telephone Encounter (Signed)
Patient needs to know if you will call her in a script for the mouthwash she got from you in the past.  Call it into [facility] in [place] ([address]  Thanks

## 2013-03-04 NOTE — ED Notes (Signed)
MD at bedside. 

## 2013-03-04 NOTE — ED Notes (Signed)
Pt reports she is unable to take Clindamycin PO and requesting vaginal medication.  Dr. Ignacia Palma at bedside.

## 2013-03-04 NOTE — Telephone Encounter (Signed)
Is it the Magic mouthwash, or the nystatin swish and swallow it?
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
## 2013-03-04 NOTE — ED Notes (Signed)
On arrival to patients room to undress her for pelvic exam, she states she is not willing to let a female do her exam. She requests a female practitioner.

## 2013-03-04 NOTE — Telephone Encounter (Signed)
Call pt: event monitor shows some tachycardia, inc HR, but no arhythemia when experiencing sxs.  This is very reassuring that she is not having an [place].

## 2013-03-05 ENCOUNTER — Ambulatory Visit: Payer: Medicare Other | Admitting: Physician Assistant

## 2013-03-05 DIAGNOSIS — K8020 Calculus of gallbladder without cholecystitis without obstruction: Secondary | ICD-10-CM | POA: Diagnosis not present

## 2013-03-05 DIAGNOSIS — D1803 Hemangioma of intra-abdominal structures: Secondary | ICD-10-CM | POA: Diagnosis not present

## 2013-03-05 DIAGNOSIS — R109 Unspecified abdominal pain: Secondary | ICD-10-CM | POA: Diagnosis not present

## 2013-03-05 DIAGNOSIS — D649 Anemia, unspecified: Secondary | ICD-10-CM | POA: Diagnosis not present

## 2013-03-05 DIAGNOSIS — R195 Other fecal abnormalities: Secondary | ICD-10-CM | POA: Diagnosis not present

## 2013-03-05 DIAGNOSIS — D250 Submucous leiomyoma of uterus: Secondary | ICD-10-CM | POA: Diagnosis not present

## 2013-03-05 DIAGNOSIS — Z9889 Other specified postprocedural states: Secondary | ICD-10-CM | POA: Diagnosis not present

## 2013-03-05 LAB — GC/CHLAMYDIA PROBE AMP
CT Probe RNA: NEGATIVE
GC Probe RNA: NEGATIVE

## 2013-03-06 NOTE — Telephone Encounter (Signed)
pt informed.Jennifer [name] [place]

## 2013-03-10 DIAGNOSIS — K648 Other hemorrhoids: Secondary | ICD-10-CM | POA: Diagnosis not present

## 2013-03-10 DIAGNOSIS — J45909 Unspecified asthma, uncomplicated: Secondary | ICD-10-CM | POA: Diagnosis not present

## 2013-03-10 DIAGNOSIS — I10 Essential (primary) hypertension: Secondary | ICD-10-CM | POA: Diagnosis not present

## 2013-03-10 DIAGNOSIS — D649 Anemia, unspecified: Secondary | ICD-10-CM | POA: Diagnosis not present

## 2013-03-10 DIAGNOSIS — K319 Disease of stomach and duodenum, unspecified: Secondary | ICD-10-CM | POA: Diagnosis not present

## 2013-03-10 DIAGNOSIS — K2990 Gastroduodenitis, unspecified, without bleeding: Secondary | ICD-10-CM | POA: Diagnosis not present

## 2013-03-10 DIAGNOSIS — R109 Unspecified abdominal pain: Secondary | ICD-10-CM | POA: Diagnosis not present

## 2013-03-10 DIAGNOSIS — R1011 Right upper quadrant pain: Secondary | ICD-10-CM | POA: Diagnosis not present

## 2013-03-10 DIAGNOSIS — K2940 Chronic atrophic gastritis without bleeding: Secondary | ICD-10-CM | POA: Diagnosis not present

## 2013-03-10 DIAGNOSIS — F411 Generalized anxiety disorder: Secondary | ICD-10-CM | POA: Diagnosis not present

## 2013-03-10 DIAGNOSIS — K2970 Gastritis, unspecified, without bleeding: Secondary | ICD-10-CM | POA: Diagnosis not present

## 2013-03-10 DIAGNOSIS — Z79899 Other long term (current) drug therapy: Secondary | ICD-10-CM | POA: Diagnosis not present

## 2013-03-10 DIAGNOSIS — R195 Other fecal abnormalities: Secondary | ICD-10-CM | POA: Diagnosis not present

## 2013-03-10 DIAGNOSIS — R569 Unspecified convulsions: Secondary | ICD-10-CM | POA: Diagnosis not present

## 2013-03-10 DIAGNOSIS — K921 Melena: Secondary | ICD-10-CM | POA: Diagnosis not present

## 2013-03-10 DIAGNOSIS — K639 Disease of intestine, unspecified: Secondary | ICD-10-CM | POA: Diagnosis not present

## 2013-03-10 DIAGNOSIS — G4733 Obstructive sleep apnea (adult) (pediatric): Secondary | ICD-10-CM | POA: Diagnosis not present

## 2013-03-10 DIAGNOSIS — R933 Abnormal findings on diagnostic imaging of other parts of digestive tract: Secondary | ICD-10-CM | POA: Diagnosis not present

## 2013-03-11 DIAGNOSIS — Z882 Allergy status to sulfonamides status: Secondary | ICD-10-CM | POA: Diagnosis not present

## 2013-03-11 DIAGNOSIS — N925 Other specified irregular menstruation: Secondary | ICD-10-CM | POA: Diagnosis not present

## 2013-03-11 DIAGNOSIS — Z888 Allergy status to other drugs, medicaments and biological substances status: Secondary | ICD-10-CM | POA: Diagnosis not present

## 2013-03-11 DIAGNOSIS — Z87442 Personal history of urinary calculi: Secondary | ICD-10-CM | POA: Diagnosis not present

## 2013-03-11 DIAGNOSIS — F4310 Post-traumatic stress disorder, unspecified: Secondary | ICD-10-CM | POA: Diagnosis not present

## 2013-03-11 DIAGNOSIS — F339 Major depressive disorder, recurrent, unspecified: Secondary | ICD-10-CM | POA: Diagnosis not present

## 2013-03-11 DIAGNOSIS — R109 Unspecified abdominal pain: Secondary | ICD-10-CM | POA: Diagnosis not present

## 2013-03-11 DIAGNOSIS — I10 Essential (primary) hypertension: Secondary | ICD-10-CM | POA: Diagnosis not present

## 2013-03-11 DIAGNOSIS — N949 Unspecified condition associated with female genital organs and menstrual cycle: Secondary | ICD-10-CM | POA: Diagnosis not present

## 2013-03-11 DIAGNOSIS — N938 Other specified abnormal uterine and vaginal bleeding: Secondary | ICD-10-CM | POA: Diagnosis not present

## 2013-03-11 DIAGNOSIS — Z8673 Personal history of transient ischemic attack (TIA), and cerebral infarction without residual deficits: Secondary | ICD-10-CM | POA: Diagnosis not present

## 2013-03-11 DIAGNOSIS — F411 Generalized anxiety disorder: Secondary | ICD-10-CM | POA: Diagnosis not present

## 2013-03-11 DIAGNOSIS — Z881 Allergy status to other antibiotic agents status: Secondary | ICD-10-CM | POA: Diagnosis not present

## 2013-03-11 DIAGNOSIS — Z79899 Other long term (current) drug therapy: Secondary | ICD-10-CM | POA: Diagnosis not present

## 2013-03-11 DIAGNOSIS — C549 Malignant neoplasm of corpus uteri, unspecified: Secondary | ICD-10-CM | POA: Diagnosis not present

## 2013-03-11 DIAGNOSIS — J45909 Unspecified asthma, uncomplicated: Secondary | ICD-10-CM | POA: Diagnosis not present

## 2013-03-12 ENCOUNTER — Encounter: Payer: Self-pay | Admitting: Family Medicine

## 2013-03-12 ENCOUNTER — Ambulatory Visit (INDEPENDENT_AMBULATORY_CARE_PROVIDER_SITE_OTHER): Payer: Medicare Other | Admitting: Family Medicine

## 2013-03-12 VITALS — BP 150/90 | HR 82

## 2013-03-12 DIAGNOSIS — C530 Malignant neoplasm of endocervix: Secondary | ICD-10-CM

## 2013-03-12 NOTE — Progress Notes (Signed)
  Subjective:    Patient ID: Jennifer [name], female    DOB: June 05, 1966, 47 y.o.   MRN: [id number]  HPI Saw Dr. Loman [name] and had endoscopy/colonoscopy  and biopsies done lat week.  Doesn't have the results back.  She felt SOB and had a lot of throat irritation.  Also had an MRI as well.  She  Has noticed a know in the RUQ. Says can feel like a muscle spasm.  Painful when she pushes   Saw Dr. 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  She would like to go back to Dr. Clifton [name] who did her Paget's surgery.  She still has a swollen LN in her groin.  She is still having some bleeding and pelvic discomfort.  She is upset because she says she will talk was told several years ago to have a hysterectomy by Dr. Clifton [name]. Evidently Dr. Clifton [name] was worried that eventually she would have some abnormalities of the endometrial cells. She says she is upset that she did make the decision to do this earlier.   Review of Systems     Objective:   Physical Exam  Constitutional: She appears well-developed and well-nourished.  HENT:  Head: Normocephalic and atraumatic.  Skin: Skin is warm and dry.  Psychiatric: She has a normal mood and affect. Her behavior is normal.          Assessment & Plan:  Endometrial adenocarcinoma- Will refer back to Dr. Clifton [name] for surgery since she feels much more comfort with her and she was the physician actually did her surgery for Paget's.. She will need a hysterectomy.  I explained her that we can't know exactly what stage she is in until she has a hysterectomy and the uterus was sent to pathology. She is very concerned. I tried to reassure her that she has had endometrial biopsies in the last several years to have been normal so hopefully we are catching this early period of Jennifer [name] does try to comfort her and tried to reassure her.  Dysphasia-Jennifer [name] to see her biopsy results for endoscopy in her colonoscopy. I will keep  my operative this.  Time spent 30 minutes, greater than 50% of the time spent counseling about endometrial adenocarcinoma.

## 2013-03-13 ENCOUNTER — Ambulatory Visit: Payer: Medicare Other | Admitting: Family Medicine

## 2013-03-13 DIAGNOSIS — F4310 Post-traumatic stress disorder, unspecified: Secondary | ICD-10-CM | POA: Diagnosis not present

## 2013-03-17 DIAGNOSIS — M7989 Other specified soft tissue disorders: Secondary | ICD-10-CM | POA: Diagnosis not present

## 2013-03-17 DIAGNOSIS — M79609 Pain in unspecified limb: Secondary | ICD-10-CM | POA: Diagnosis not present

## 2013-03-17 DIAGNOSIS — M25569 Pain in unspecified knee: Secondary | ICD-10-CM | POA: Diagnosis not present

## 2013-03-18 DIAGNOSIS — J328 Other chronic sinusitis: Secondary | ICD-10-CM | POA: Diagnosis not present

## 2013-03-18 DIAGNOSIS — Z881 Allergy status to other antibiotic agents status: Secondary | ICD-10-CM | POA: Diagnosis not present

## 2013-03-18 DIAGNOSIS — Z886 Allergy status to analgesic agent status: Secondary | ICD-10-CM | POA: Diagnosis not present

## 2013-03-18 DIAGNOSIS — M79609 Pain in unspecified limb: Secondary | ICD-10-CM | POA: Diagnosis not present

## 2013-03-18 DIAGNOSIS — B370 Candidal stomatitis: Secondary | ICD-10-CM | POA: Diagnosis not present

## 2013-03-18 DIAGNOSIS — R0789 Other chest pain: Secondary | ICD-10-CM | POA: Diagnosis not present

## 2013-03-18 DIAGNOSIS — Z885 Allergy status to narcotic agent status: Secondary | ICD-10-CM | POA: Diagnosis not present

## 2013-03-18 DIAGNOSIS — F339 Major depressive disorder, recurrent, unspecified: Secondary | ICD-10-CM | POA: Diagnosis not present

## 2013-03-18 DIAGNOSIS — R1310 Dysphagia, unspecified: Secondary | ICD-10-CM | POA: Diagnosis not present

## 2013-03-18 DIAGNOSIS — Z880 Allergy status to penicillin: Secondary | ICD-10-CM | POA: Diagnosis not present

## 2013-03-20 NOTE — ED Provider Notes (Signed)
Medical screening examination/treatment/procedure(s) were conducted as a shared visit with non-physician practitioner(s) and myself.  I personally evaluated the patient during the encounter Pt with pelvic pain, vaginal discharge, anxiety.  See my ED note.   Carleene Cooper III, MD 03/20/13 1249

## 2013-03-21 ENCOUNTER — Encounter: Payer: Self-pay | Admitting: Family Medicine

## 2013-03-21 DIAGNOSIS — J328 Other chronic sinusitis: Secondary | ICD-10-CM | POA: Diagnosis not present

## 2013-03-21 DIAGNOSIS — J309 Allergic rhinitis, unspecified: Secondary | ICD-10-CM | POA: Diagnosis not present

## 2013-03-24 ENCOUNTER — Telehealth: Payer: Self-pay | Admitting: *Deleted

## 2013-03-24 DIAGNOSIS — J3489 Other specified disorders of nose and nasal sinuses: Secondary | ICD-10-CM | POA: Diagnosis not present

## 2013-03-24 DIAGNOSIS — R059 Cough, unspecified: Secondary | ICD-10-CM | POA: Diagnosis not present

## 2013-03-24 DIAGNOSIS — R05 Cough: Secondary | ICD-10-CM | POA: Diagnosis not present

## 2013-03-24 NOTE — Telephone Encounter (Signed)
lvm Returning pt's call. Pt stated that she feels like she may need an xray of her chest due to having congestion? Asked her to call back to clarify.Loralee Pacas [place]

## 2013-03-25 DIAGNOSIS — R42 Dizziness and giddiness: Secondary | ICD-10-CM | POA: Diagnosis not present

## 2013-03-25 DIAGNOSIS — J310 Chronic rhinitis: Secondary | ICD-10-CM | POA: Diagnosis not present

## 2013-03-25 DIAGNOSIS — R51 Headache: Secondary | ICD-10-CM | POA: Diagnosis not present

## 2013-03-26 ENCOUNTER — Telehealth: Payer: Self-pay | Admitting: *Deleted

## 2013-03-26 ENCOUNTER — Encounter: Payer: Self-pay | Admitting: Family Medicine

## 2013-03-26 DIAGNOSIS — G40804 Other epilepsy, intractable, without status epilepticus: Secondary | ICD-10-CM | POA: Diagnosis not present

## 2013-03-26 DIAGNOSIS — R51 Headache: Secondary | ICD-10-CM | POA: Diagnosis not present

## 2013-03-26 DIAGNOSIS — J3489 Other specified disorders of nose and nasal sinuses: Secondary | ICD-10-CM | POA: Diagnosis not present

## 2013-03-26 NOTE — Telephone Encounter (Signed)
Pt called very upset and stated that when she went to the ENT they did a culture of her nasal passages and it was positive for MRSA. Pt's is concerned if this has spread into her blood and if she should go to the ED and have them to check her blood and whether or not she is contagious and it she can tolerate the Abx that she was given. I made her an appt for tomorrow at 415 pm. Will forward to Dr.Metheney for advice to give pt prior to appt.Loralee Pacas [place]

## 2013-03-26 NOTE — Telephone Encounter (Signed)
Encourage her that if was in her blood stream she would be septic which she is not.  Please call ENT to get records.

## 2013-03-27 ENCOUNTER — Encounter (HOSPITAL_BASED_OUTPATIENT_CLINIC_OR_DEPARTMENT_OTHER): Payer: Self-pay

## 2013-03-27 ENCOUNTER — Emergency Department (HOSPITAL_BASED_OUTPATIENT_CLINIC_OR_DEPARTMENT_OTHER): Payer: Medicare Other

## 2013-03-27 ENCOUNTER — Emergency Department (HOSPITAL_BASED_OUTPATIENT_CLINIC_OR_DEPARTMENT_OTHER)
Admission: EM | Admit: 2013-03-27 | Discharge: 2013-03-27 | Disposition: A | Discharge status: Home / Self Care | Payer: Medicare Other | Attending: Emergency Medicine | Admitting: Emergency Medicine

## 2013-03-27 ENCOUNTER — Ambulatory Visit (INDEPENDENT_AMBULATORY_CARE_PROVIDER_SITE_OTHER): Payer: Medicare Other | Admitting: Family Medicine

## 2013-03-27 ENCOUNTER — Encounter: Payer: Self-pay | Admitting: Family Medicine

## 2013-03-27 VITALS — BP 138/90 | HR 95 | Wt 189.0 lb

## 2013-03-27 DIAGNOSIS — K219 Gastro-esophageal reflux disease without esophagitis: Secondary | ICD-10-CM | POA: Diagnosis not present

## 2013-03-27 DIAGNOSIS — Z880 Allergy status to penicillin: Secondary | ICD-10-CM | POA: Insufficient documentation

## 2013-03-27 DIAGNOSIS — E559 Vitamin D deficiency, unspecified: Secondary | ICD-10-CM

## 2013-03-27 DIAGNOSIS — I10 Essential (primary) hypertension: Secondary | ICD-10-CM | POA: Insufficient documentation

## 2013-03-27 DIAGNOSIS — N951 Menopausal and female climacteric states: Secondary | ICD-10-CM | POA: Diagnosis not present

## 2013-03-27 DIAGNOSIS — IMO0002 Reserved for concepts with insufficient information to code with codable children: Secondary | ICD-10-CM | POA: Insufficient documentation

## 2013-03-27 DIAGNOSIS — R52 Pain, unspecified: Secondary | ICD-10-CM | POA: Insufficient documentation

## 2013-03-27 DIAGNOSIS — G8929 Other chronic pain: Secondary | ICD-10-CM | POA: Diagnosis not present

## 2013-03-27 DIAGNOSIS — J45909 Unspecified asthma, uncomplicated: Secondary | ICD-10-CM | POA: Diagnosis not present

## 2013-03-27 DIAGNOSIS — R059 Cough, unspecified: Secondary | ICD-10-CM

## 2013-03-27 DIAGNOSIS — R079 Chest pain, unspecified: Secondary | ICD-10-CM | POA: Diagnosis not present

## 2013-03-27 DIAGNOSIS — Z8709 Personal history of other diseases of the respiratory system: Secondary | ICD-10-CM | POA: Diagnosis not present

## 2013-03-27 DIAGNOSIS — Z8659 Personal history of other mental and behavioral disorders: Secondary | ICD-10-CM | POA: Insufficient documentation

## 2013-03-27 DIAGNOSIS — Z79899 Other long term (current) drug therapy: Secondary | ICD-10-CM | POA: Insufficient documentation

## 2013-03-27 DIAGNOSIS — Z862 Personal history of diseases of the blood and blood-forming organs and certain disorders involving the immune mechanism: Secondary | ICD-10-CM | POA: Insufficient documentation

## 2013-03-27 DIAGNOSIS — Z8542 Personal history of malignant neoplasm of other parts of uterus: Secondary | ICD-10-CM | POA: Diagnosis not present

## 2013-03-27 DIAGNOSIS — J329 Chronic sinusitis, unspecified: Secondary | ICD-10-CM | POA: Insufficient documentation

## 2013-03-27 DIAGNOSIS — Z8639 Personal history of other endocrine, nutritional and metabolic disease: Secondary | ICD-10-CM | POA: Insufficient documentation

## 2013-03-27 DIAGNOSIS — Z8614 Personal history of Methicillin resistant Staphylococcus aureus infection: Secondary | ICD-10-CM | POA: Diagnosis not present

## 2013-03-27 DIAGNOSIS — Z8742 Personal history of other diseases of the female genital tract: Secondary | ICD-10-CM | POA: Insufficient documentation

## 2013-03-27 DIAGNOSIS — Z87891 Personal history of nicotine dependence: Secondary | ICD-10-CM | POA: Diagnosis not present

## 2013-03-27 DIAGNOSIS — C549 Malignant neoplasm of corpus uteri, unspecified: Secondary | ICD-10-CM | POA: Diagnosis not present

## 2013-03-27 DIAGNOSIS — R232 Flushing: Secondary | ICD-10-CM

## 2013-03-27 DIAGNOSIS — R05 Cough: Secondary | ICD-10-CM | POA: Insufficient documentation

## 2013-03-27 DIAGNOSIS — C541 Malignant neoplasm of endometrium: Secondary | ICD-10-CM

## 2013-03-27 DIAGNOSIS — J029 Acute pharyngitis, unspecified: Secondary | ICD-10-CM | POA: Diagnosis not present

## 2013-03-27 HISTORY — DX: Carrier or suspected carrier of methicillin resistant Staphylococcus aureus: Z22.322

## 2013-03-27 HISTORY — DX: Malignant neoplasm of uterus, part unspecified: C55

## 2013-03-27 LAB — CBC WITH DIFFERENTIAL/PLATELET
Basophils Absolute: 0 10*3/uL (ref 0.0–0.1)
Basophils Relative: 0 % (ref 0–1)
Eosinophils Absolute: 0.2 10*3/uL (ref 0.0–0.7)
Eosinophils Relative: 3 % (ref 0–5)
HCT: 39 % (ref 36.0–46.0)
Hemoglobin: 13.2 g/dL (ref 12.0–15.0)
Lymphocytes Relative: 22 % (ref 12–46)
Lymphs Abs: 1.6 10*3/uL (ref 0.7–4.0)
MCH: 29.1 pg (ref 26.0–34.0)
MCHC: 33.8 g/dL (ref 30.0–36.0)
MCV: 85.9 fL (ref 78.0–100.0)
Monocytes Absolute: 0.6 10*3/uL (ref 0.1–1.0)
Monocytes Relative: 8 % (ref 3–12)
Neutro Abs: 4.7 10*3/uL (ref 1.7–7.7)
Neutrophils Relative %: 67 % (ref 43–77)
Platelets: 247 10*3/uL (ref 150–400)
RBC: 4.54 MIL/uL (ref 3.87–5.11)
RDW: 14.4 % (ref 11.5–15.5)
WBC: 7.2 10*3/uL (ref 4.0–10.5)

## 2013-03-27 MED ORDER — NYSTATIN 100000 UNIT/ML MT SUSP: 500000.0000 [IU] | Freq: Four times a day (QID) | OROMUCOSAL | Status: DC

## 2013-03-27 NOTE — ED Notes (Signed)
Pt reports recent diagnosis of MRSA in sinuses and she has generalized body aches, nasal congestion/drainage and sore throat.  She also reports rib pain and cough.
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
## 2013-03-27 NOTE — Telephone Encounter (Signed)
Pt seen in ED.Jennifer [name]

## 2013-03-27 NOTE — Telephone Encounter (Signed)
Records can be viewed under care everywhere tab.Jennifer [name] [place]

## 2013-03-27 NOTE — ED Notes (Signed)
MD at bedside. 

## 2013-03-27 NOTE — Discharge Instructions (Signed)
Cough, Adult  A cough is a reflex that helps clear your throat and airways. It can help heal the body or may be a reaction to an irritated airway. A cough may only last 2 or 3 weeks (acute) or may last more than 8 weeks (chronic).  CAUSES Acute cough:  Viral or bacterial infections. Chronic cough:  Infections.  Allergies.  Asthma.  Post-nasal drip.  Smoking.  Heartburn or acid reflux.  Some medicines.  Chronic lung problems (COPD).  Cancer. SYMPTOMS   Cough.  Fever.  Chest pain.  Increased breathing rate.  High-pitched whistling sound when breathing (wheezing).  Colored mucus that you cough up (sputum). TREATMENT   A bacterial cough may be treated with antibiotic medicine.  A viral cough must run its course and will not respond to antibiotics.  Your caregiver may recommend other treatments if you have a chronic cough. HOME CARE INSTRUCTIONS   Only take over-the-counter or prescription medicines for pain, discomfort, or fever as directed by your caregiver. Use cough suppressants only as directed by your caregiver.  Use a cold steam vaporizer or humidifier in your bedroom or home to help loosen secretions.  Sleep in a semi-upright position if your cough is worse at night.  Rest as needed.  Stop smoking if you smoke. SEEK IMMEDIATE MEDICAL CARE IF:   You have pus in your sputum.  Your cough starts to worsen.  You cannot control your cough with suppressants and are losing sleep.  You begin coughing up blood.  You have difficulty breathing.  You develop pain which is getting worse or is uncontrolled with medicine.  You have a fever. MAKE SURE YOU:   Understand these instructions.  Will watch your condition.  Will get help right away if you are not doing well or get worse. Document Released: 01/27/2011 Document Revised: 10/23/2011 Document Reviewed: 01/27/2011 [employer] Patient Information 2014 [employer].  

## 2013-03-27 NOTE — Progress Notes (Signed)
Subjective:    Patient ID: Jennifer [name], female    DOB: [birth year], 47 y.o.   MRN: [id number]  HPI Started feeling poorly when she had her procedures. She had a really ST, and nasal congestion and pressure. She finally went to see ENT. They did do a sinus culture and it grew back MRSA and strep viridans. The fungal culture is still pending. They put her on nasal nebulized Bactrim and oral clindamycin based on the sensitivities of cultures.  Her husband had MRSA infeciton years ago and she wonders if she could cut it then. She also knows how sick her husband was. He had an open wound on his arm. She's worried about the MRSA spreading throughout her body and making herself that .   Endometrial carcinoma-She has not had her hysterectomy. Has f/u with her gyn/onc next week and then they should schedule her hysterectomy.    Bowels have been slow since she had her colonoscopy.  She noticed that her weight is up 4 pounds and she felt more bloated. She has been having a stool daily but has been a very small amount.Marland Kitchen   Has been burning up a night - Says will got really hot and sweaty and wil have ot take her clothes off. She will try to find a cool spot. No true fever. She feels it has become more frequent. She still has some vaginal bleeding intermittently.   Hypertension-she did go back and see her cardiologist and he recommended her starting, I believe diltiazem for blood pressure and heart rate control. She has picked up the prescription has not started it yet. She went to complete her antibiotics before she starts any medications so that she can determine if she feels well on it or not.  Review of Systems     Objective:   Physical Exam  Constitutional: She is oriented to person, place, and time. She appears well-developed and well-nourished.  HENT:  Head: Normocephalic and atraumatic.  Right Ear: External ear normal.  Left Ear: External ear normal.  Nose: Nose normal.  Mouth/Throat:  Oropharynx is clear and moist.  TMs and canals are clear.   Eyes: Conjunctivae and EOM are normal. Pupils are equal, round, and reactive to light.  Neck: Neck supple. No thyromegaly present.  Cardiovascular: Normal rate, regular rhythm and normal heart sounds.   Pulmonary/Chest: Effort normal and breath sounds normal. She has no wheezes.  Abdominal: She exhibits no distension and no mass. There is tenderness. There is no rebound and no guarding.  Mild epigastric tenderness and left lower quadrant tenderness. She does have significant bloating and increased tympany.  Musculoskeletal: She exhibits no edema.  Lymphadenopathy:    She has no cervical adenopathy.  Neurological: She is alert and oriented to person, place, and time.  Skin: Skin is warm and dry.  Psychiatric: She has a normal mood and affect.          Assessment & Plan:  Bacterial sinus infection-encouraged her to try to continue the clindamycin. So far she's taken several doses and has not had any problems or side effects or reactions. Though she did say that her stomach was normal and the lot today and she has felt more bloated. I encouraged her to consider taking a probiotic. This could certainly be helpful and also to take a stool softener or possibly a suppository to help her bowels move since she has been a little bit more constipated since having her colonoscopy. I also encouraged her to  call the ENT office back to ask when they would like to see her back. They have not asked her to schedule a followup appointment.  Endometrial cancer-she follows up with her gynecologist oncologist, Dr. Madaline Guthrie, next week, to discuss hysterectomy. She is now thinking about possibly seeing a different gynecologist t oncologist in the [facility] area. Her name is Dr. Wendy Poet. She comes up from her [facility] Medical [facility] in [place]. Patient does live in the [facility] area. I encouraged her to keep her current appointment finger  for her consultation.  Hot flashes-I. do believe that what she is describing are actually hot flashes. I certainly think it's warranted to check normal levels to see if she could be postmenopausal. She has had some vaginal bleeding but it's difficult to say if this is a true period or if she is just bleeding because of her endometrial carcinoma. We did do hormone testing about a year and half ago.  Time spent 30 minutes, greater than 50% spent counseling about bacterial sinus infection, her endometrial cancer and hot flashes.  Hypertension-encouraged her to start her medication regimen as soon as she completes her antibiotics.

## 2013-03-28 DIAGNOSIS — R059 Cough, unspecified: Secondary | ICD-10-CM | POA: Diagnosis not present

## 2013-03-28 DIAGNOSIS — K8020 Calculus of gallbladder without cholecystitis without obstruction: Secondary | ICD-10-CM | POA: Diagnosis not present

## 2013-03-28 DIAGNOSIS — I10 Essential (primary) hypertension: Secondary | ICD-10-CM | POA: Diagnosis not present

## 2013-03-28 DIAGNOSIS — Z888 Allergy status to other drugs, medicaments and biological substances status: Secondary | ICD-10-CM | POA: Diagnosis not present

## 2013-03-28 DIAGNOSIS — J342 Deviated nasal septum: Secondary | ICD-10-CM | POA: Diagnosis not present

## 2013-03-28 DIAGNOSIS — R109 Unspecified abdominal pain: Secondary | ICD-10-CM | POA: Diagnosis not present

## 2013-03-28 DIAGNOSIS — K449 Diaphragmatic hernia without obstruction or gangrene: Secondary | ICD-10-CM | POA: Diagnosis not present

## 2013-03-28 DIAGNOSIS — E7800 Pure hypercholesterolemia, unspecified: Secondary | ICD-10-CM | POA: Diagnosis not present

## 2013-03-28 DIAGNOSIS — G40802 Other epilepsy, not intractable, without status epilepticus: Secondary | ICD-10-CM | POA: Diagnosis not present

## 2013-03-28 DIAGNOSIS — D509 Iron deficiency anemia, unspecified: Secondary | ICD-10-CM | POA: Diagnosis not present

## 2013-03-28 DIAGNOSIS — R079 Chest pain, unspecified: Secondary | ICD-10-CM | POA: Diagnosis not present

## 2013-03-28 DIAGNOSIS — Z886 Allergy status to analgesic agent status: Secondary | ICD-10-CM | POA: Diagnosis not present

## 2013-03-28 DIAGNOSIS — Z9109 Other allergy status, other than to drugs and biological substances: Secondary | ICD-10-CM | POA: Diagnosis not present

## 2013-03-28 DIAGNOSIS — Z881 Allergy status to other antibiotic agents status: Secondary | ICD-10-CM | POA: Diagnosis not present

## 2013-03-28 DIAGNOSIS — Z79899 Other long term (current) drug therapy: Secondary | ICD-10-CM | POA: Diagnosis not present

## 2013-03-28 DIAGNOSIS — R05 Cough: Secondary | ICD-10-CM | POA: Diagnosis not present

## 2013-03-28 DIAGNOSIS — M889 Osteitis deformans of unspecified bone: Secondary | ICD-10-CM | POA: Diagnosis not present

## 2013-03-28 DIAGNOSIS — Z880 Allergy status to penicillin: Secondary | ICD-10-CM | POA: Diagnosis not present

## 2013-03-28 DIAGNOSIS — Z8542 Personal history of malignant neoplasm of other parts of uterus: Secondary | ICD-10-CM | POA: Insufficient documentation

## 2013-03-28 DIAGNOSIS — J45909 Unspecified asthma, uncomplicated: Secondary | ICD-10-CM | POA: Diagnosis not present

## 2013-03-28 DIAGNOSIS — J3489 Other specified disorders of nose and nasal sinuses: Secondary | ICD-10-CM | POA: Diagnosis not present

## 2013-03-28 DIAGNOSIS — C539 Malignant neoplasm of cervix uteri, unspecified: Secondary | ICD-10-CM | POA: Diagnosis not present

## 2013-03-28 DIAGNOSIS — J329 Chronic sinusitis, unspecified: Secondary | ICD-10-CM | POA: Diagnosis not present

## 2013-03-28 LAB — ESTRADIOL: Estradiol: 77 pg/mL

## 2013-03-28 LAB — FOLLICLE STIMULATING HORMONE: FSH: 10 m[IU]/mL

## 2013-03-28 LAB — LUTEINIZING HORMONE: LH: 5.6 m[IU]/mL

## 2013-03-28 LAB — PROGESTERONE: Progesterone: 8.3 ng/mL

## 2013-03-28 LAB — VITAMIN D 25 HYDROXY (VIT D DEFICIENCY, FRACTURES): Vit D, 25-Hydroxy: 21 ng/mL — ABNORMAL LOW (ref 30–89)

## 2013-04-01 DIAGNOSIS — R1011 Right upper quadrant pain: Secondary | ICD-10-CM | POA: Diagnosis not present

## 2013-04-01 DIAGNOSIS — R61 Generalized hyperhidrosis: Secondary | ICD-10-CM | POA: Diagnosis not present

## 2013-04-01 DIAGNOSIS — Z880 Allergy status to penicillin: Secondary | ICD-10-CM | POA: Diagnosis not present

## 2013-04-01 DIAGNOSIS — Z882 Allergy status to sulfonamides status: Secondary | ICD-10-CM | POA: Diagnosis not present

## 2013-04-01 DIAGNOSIS — J329 Chronic sinusitis, unspecified: Secondary | ICD-10-CM | POA: Diagnosis not present

## 2013-04-01 DIAGNOSIS — Z881 Allergy status to other antibiotic agents status: Secondary | ICD-10-CM | POA: Diagnosis not present

## 2013-04-01 DIAGNOSIS — R6883 Chills (without fever): Secondary | ICD-10-CM | POA: Diagnosis not present

## 2013-04-02 ENCOUNTER — Encounter (HOSPITAL_BASED_OUTPATIENT_CLINIC_OR_DEPARTMENT_OTHER): Payer: Self-pay

## 2013-04-02 ENCOUNTER — Emergency Department (HOSPITAL_BASED_OUTPATIENT_CLINIC_OR_DEPARTMENT_OTHER)
Admission: EM | Admit: 2013-04-02 | Discharge: 2013-04-02 | Disposition: A | Discharge status: Home / Self Care | Payer: Medicare Other | Attending: Emergency Medicine | Admitting: Emergency Medicine

## 2013-04-02 DIAGNOSIS — Z87891 Personal history of nicotine dependence: Secondary | ICD-10-CM | POA: Insufficient documentation

## 2013-04-02 DIAGNOSIS — Z880 Allergy status to penicillin: Secondary | ICD-10-CM | POA: Diagnosis not present

## 2013-04-02 DIAGNOSIS — IMO0002 Reserved for concepts with insufficient information to code with codable children: Secondary | ICD-10-CM | POA: Insufficient documentation

## 2013-04-02 DIAGNOSIS — Z862 Personal history of diseases of the blood and blood-forming organs and certain disorders involving the immune mechanism: Secondary | ICD-10-CM | POA: Diagnosis not present

## 2013-04-02 DIAGNOSIS — I10 Essential (primary) hypertension: Secondary | ICD-10-CM | POA: Insufficient documentation

## 2013-04-02 DIAGNOSIS — Z8709 Personal history of other diseases of the respiratory system: Secondary | ICD-10-CM | POA: Insufficient documentation

## 2013-04-02 DIAGNOSIS — R1011 Right upper quadrant pain: Secondary | ICD-10-CM | POA: Diagnosis not present

## 2013-04-02 DIAGNOSIS — Z8659 Personal history of other mental and behavioral disorders: Secondary | ICD-10-CM | POA: Diagnosis not present

## 2013-04-02 DIAGNOSIS — Z8639 Personal history of other endocrine, nutritional and metabolic disease: Secondary | ICD-10-CM | POA: Insufficient documentation

## 2013-04-02 DIAGNOSIS — R109 Unspecified abdominal pain: Secondary | ICD-10-CM

## 2013-04-02 DIAGNOSIS — J45909 Unspecified asthma, uncomplicated: Secondary | ICD-10-CM | POA: Diagnosis not present

## 2013-04-02 DIAGNOSIS — Z8614 Personal history of Methicillin resistant Staphylococcus aureus infection: Secondary | ICD-10-CM | POA: Diagnosis not present

## 2013-04-02 DIAGNOSIS — G8929 Other chronic pain: Secondary | ICD-10-CM | POA: Insufficient documentation

## 2013-04-02 DIAGNOSIS — Z79899 Other long term (current) drug therapy: Secondary | ICD-10-CM | POA: Diagnosis not present

## 2013-04-02 DIAGNOSIS — K219 Gastro-esophageal reflux disease without esophagitis: Secondary | ICD-10-CM | POA: Insufficient documentation

## 2013-04-02 DIAGNOSIS — C55 Malignant neoplasm of uterus, part unspecified: Secondary | ICD-10-CM | POA: Insufficient documentation

## 2013-04-02 LAB — URINALYSIS, ROUTINE W REFLEX MICROSCOPIC
Bilirubin Urine: NEGATIVE
Glucose, UA: NEGATIVE mg/dL
Hgb urine dipstick: NEGATIVE
Ketones, ur: NEGATIVE mg/dL
Leukocytes, UA: NEGATIVE
Nitrite: NEGATIVE
Protein, ur: NEGATIVE mg/dL
Specific Gravity, Urine: 1.027 (ref 1.005–1.030)
Urobilinogen, UA: 0.2 mg/dL (ref 0.0–1.0)
pH: 5.5 (ref 5.0–8.0)

## 2013-04-02 LAB — COMPREHENSIVE METABOLIC PANEL
ALT: 24 U/L (ref 0–35)
AST: 15 U/L (ref 0–37)
Albumin: 3.9 g/dL (ref 3.5–5.2)
Alkaline Phosphatase: 71 U/L (ref 39–117)
BUN: 14 mg/dL (ref 6–23)
CO2: 22 mEq/L (ref 19–32)
Calcium: 9.8 mg/dL (ref 8.4–10.5)
Chloride: 105 mEq/L (ref 96–112)
Creatinine, Ser: 0.5 mg/dL (ref 0.50–1.10)
GFR calc Af Amer: >90 mL/min (ref >90)
GFR calc non Af Amer: >90 mL/min (ref >90)
Glucose, Bld: 155 mg/dL — ABNORMAL HIGH (ref 70–99)
Potassium: 3.6 mEq/L (ref 3.5–5.1)
Sodium: 139 mEq/L (ref 135–145)
Total Bilirubin: 0.3 mg/dL (ref 0.3–1.2)
Total Protein: 7.6 g/dL (ref 6.0–8.3)

## 2013-04-02 LAB — CBC WITH DIFFERENTIAL/PLATELET
Basophils Absolute: 0.1 10*3/uL (ref 0.0–0.1)
Basophils Relative: 1 % (ref 0–1)
Eosinophils Absolute: 0.2 10*3/uL (ref 0.0–0.7)
Eosinophils Relative: 3 % (ref 0–5)
HCT: 42.6 % (ref 36.0–46.0)
Hemoglobin: 14.4 g/dL (ref 12.0–15.0)
Lymphocytes Relative: 23 % (ref 12–46)
Lymphs Abs: 1.5 10*3/uL (ref 0.7–4.0)
MCH: 29.8 pg (ref 26.0–34.0)
MCHC: 33.8 g/dL (ref 30.0–36.0)
MCV: 88 fL (ref 78.0–100.0)
Monocytes Absolute: 0.6 10*3/uL (ref 0.1–1.0)
Monocytes Relative: 9 % (ref 3–12)
Neutro Abs: 4.2 10*3/uL (ref 1.7–7.7)
Neutrophils Relative %: 64 % (ref 43–77)
Platelets: 240 10*3/uL (ref 150–400)
RBC: 4.84 MIL/uL (ref 3.87–5.11)
RDW: 13.6 % (ref 11.5–15.5)
WBC: 6.6 10*3/uL (ref 4.0–10.5)

## 2013-04-02 LAB — LIPASE, BLOOD: Lipase: 42 U/L (ref 11–59)

## 2013-04-02 NOTE — ED Notes (Signed)
Pt reports onset of abdominal pain and vomiting last night.

## 2013-04-02 NOTE — ED Notes (Addendum)
Pt became irate, yelling and crying when staff inquired about previous diagnostics at other facilities.  Attempted to reassure and calm pt, however behavior escalated.  Security called and at bedside.
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
## 2013-04-02 NOTE — ED Notes (Signed)
Pt appears calm and cooperative.  Langston Masker, PA-C and charge nurse informed of pt behavior.

## 2013-04-03 ENCOUNTER — Encounter: Payer: Self-pay | Admitting: Family Medicine

## 2013-04-03 ENCOUNTER — Ambulatory Visit: Payer: Medicare Other | Admitting: Family Medicine

## 2013-04-03 ENCOUNTER — Ambulatory Visit (INDEPENDENT_AMBULATORY_CARE_PROVIDER_SITE_OTHER): Payer: Medicare Other | Admitting: Family Medicine

## 2013-04-03 VITALS — BP 145/92 | HR 93 | Wt 186.0 lb

## 2013-04-03 DIAGNOSIS — E860 Dehydration: Secondary | ICD-10-CM | POA: Diagnosis not present

## 2013-04-03 DIAGNOSIS — J329 Chronic sinusitis, unspecified: Secondary | ICD-10-CM | POA: Diagnosis not present

## 2013-04-03 DIAGNOSIS — C549 Malignant neoplasm of corpus uteri, unspecified: Secondary | ICD-10-CM | POA: Diagnosis not present

## 2013-04-03 DIAGNOSIS — C541 Malignant neoplasm of endometrium: Secondary | ICD-10-CM

## 2013-04-03 LAB — POCT URINALYSIS DIPSTICK
Bilirubin, UA: NEGATIVE
Blood, UA: NEGATIVE
Glucose, UA: NEGATIVE
Ketones, UA: NEGATIVE
Leukocytes, UA: NEGATIVE
Nitrite, UA: NEGATIVE
Protein, UA: NEGATIVE
Spec Grav, UA: >=1.03
Urobilinogen, UA: 0.2
pH, UA: 5.5
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
## 2013-04-03 NOTE — ED Provider Notes (Signed)
Medical screening examination/treatment/procedure(s) were conducted as a shared visit with non-physician practitioner(s) and myself.  I personally evaluated the patient during the encounter   Patient with frequent visits for pain. Abd exam is soft and non-surgical. Recent imaging from OSH reviewed, will recommend outpatient f/u for her multiple problems.  Audree Camel, MD 04/03/13 [phone]

## 2013-04-05 DIAGNOSIS — R002 Palpitations: Secondary | ICD-10-CM | POA: Diagnosis not present

## 2013-04-05 DIAGNOSIS — R000 Tachycardia, unspecified: Secondary | ICD-10-CM | POA: Diagnosis not present

## 2013-04-06 IMAGING — US US ABDOMEN COMPLETE
1 series · 14 of 25 positions shown · non-contrast
Comparison: 11/24/2011

CLINICAL DATA: Abdominal pain, nausea.

COMPLETE ABDOMINAL ULTRASOUND

[Series 1: us abdomen complete · 0.37mm/px · 14 of 64 slices shown]
[im 1/64]
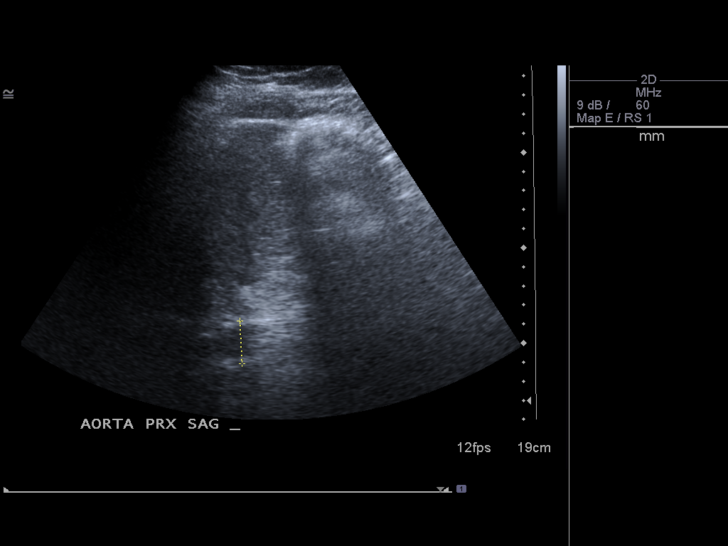
[im 6/64]
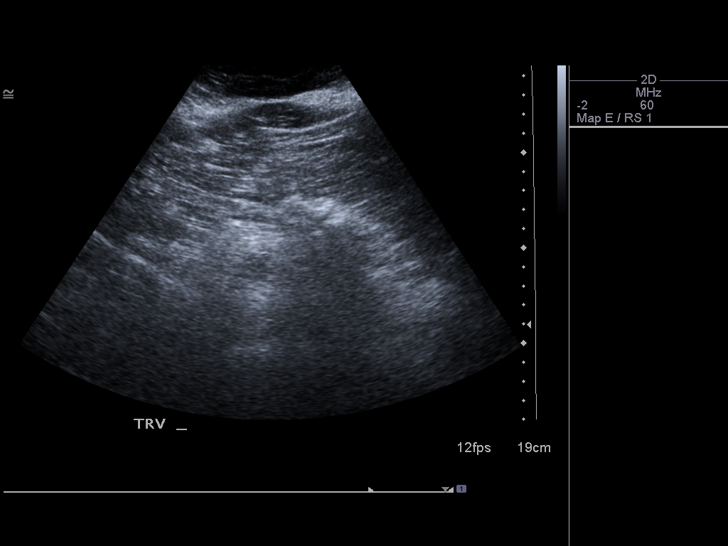
[im 11/64]
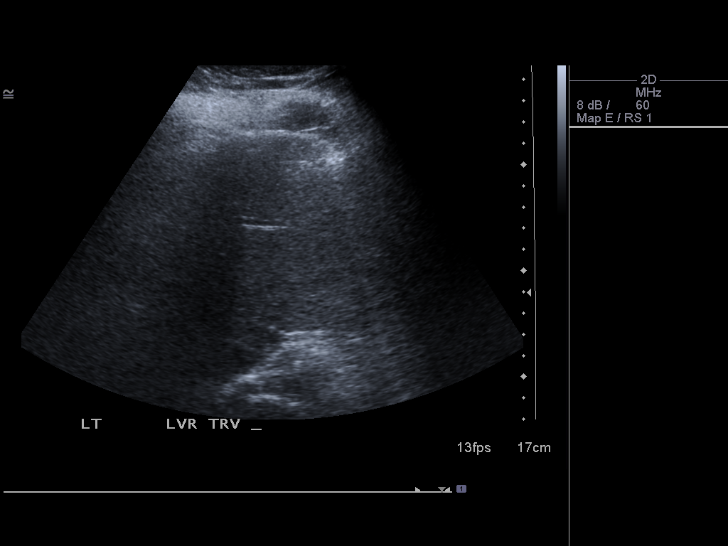
[im 16/64]
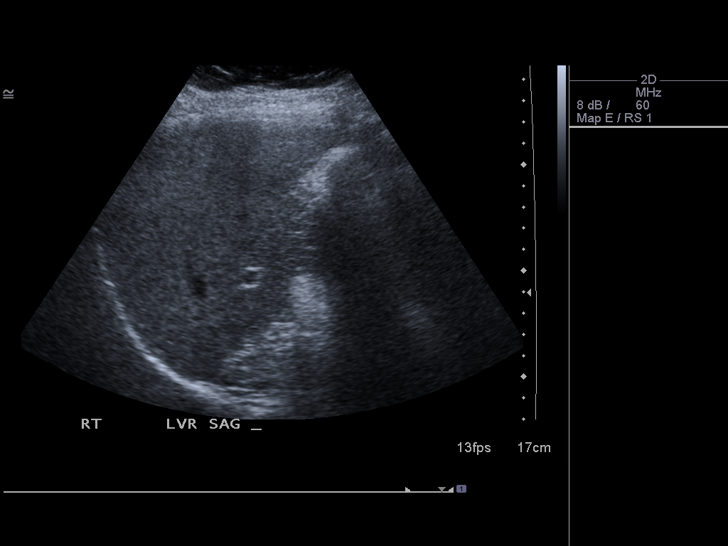
[im 22/64]
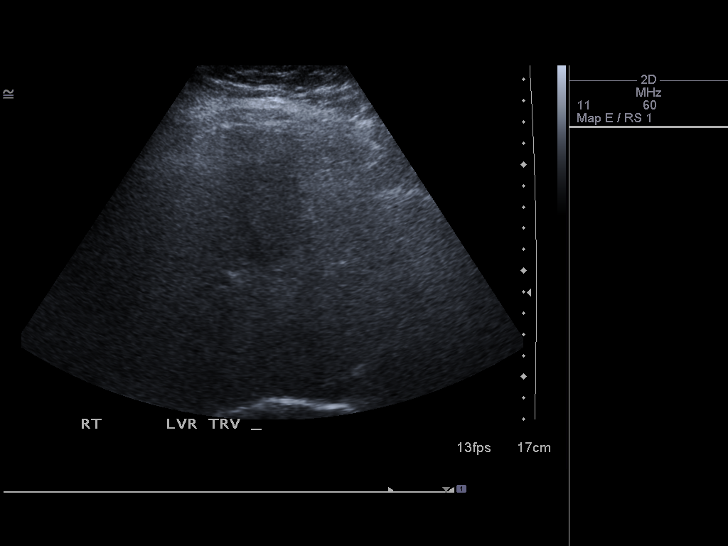
[im 24/64]
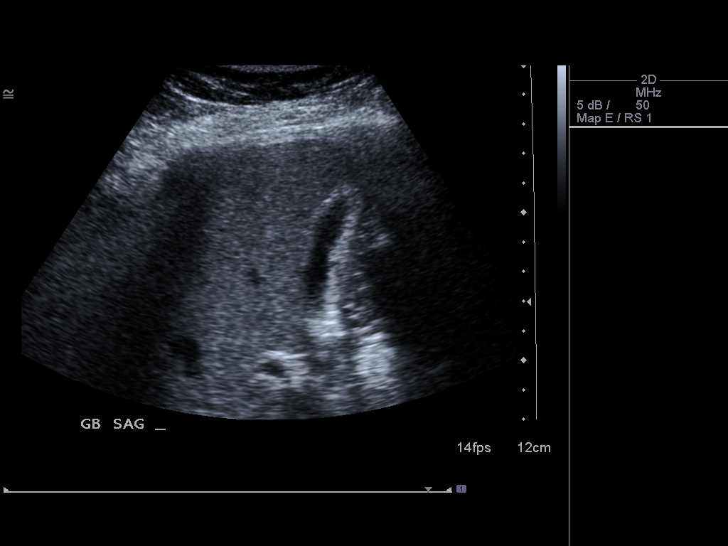
[im 29/64]
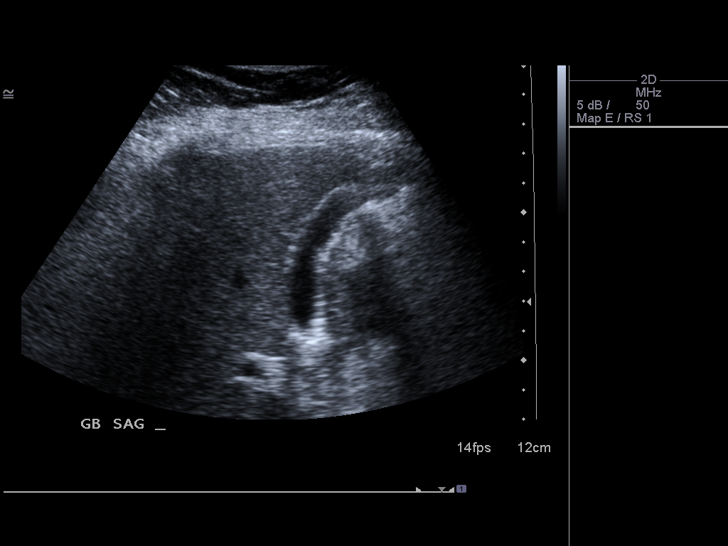
[im 35/64]
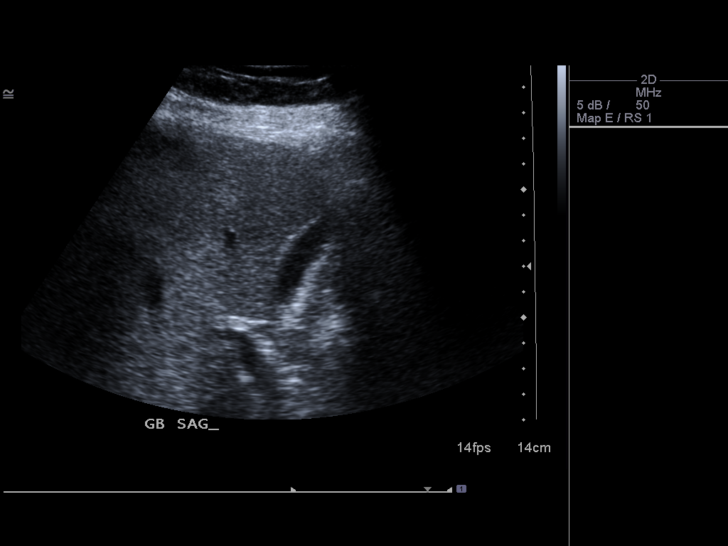
[im 40/64]
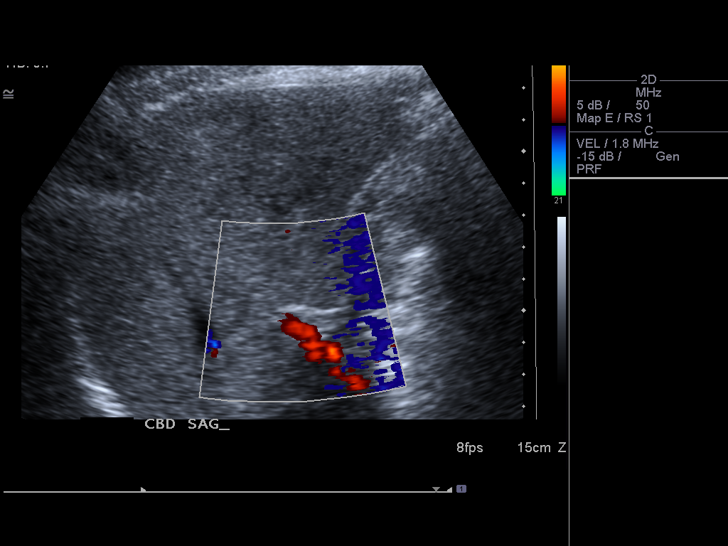
[im 43/64]
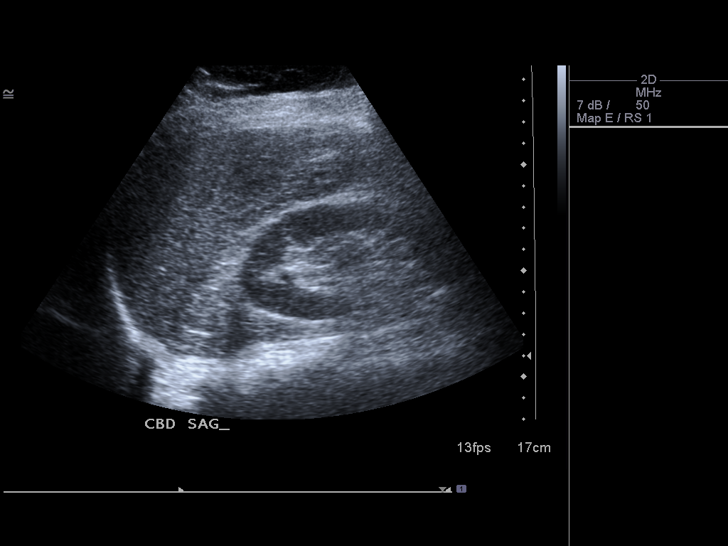
[im 48/64]
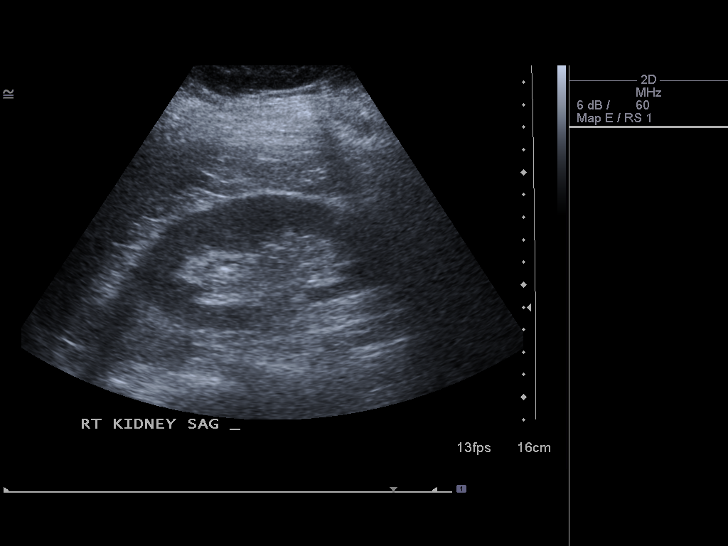
[im 53/64]
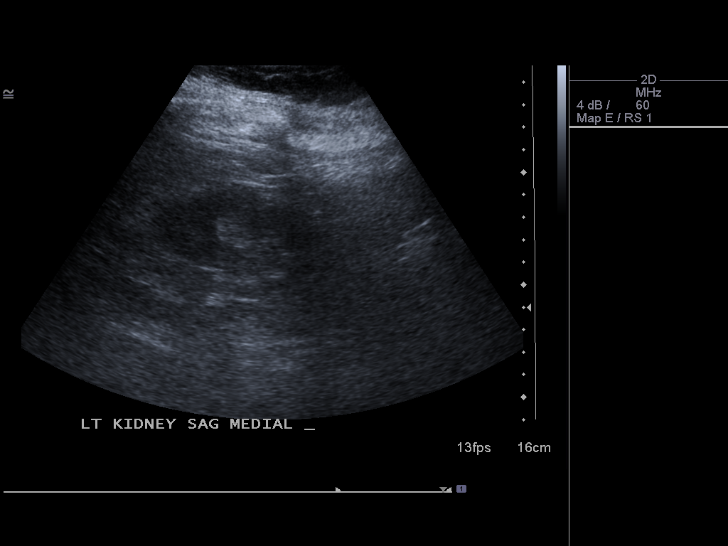
[im 58/64]
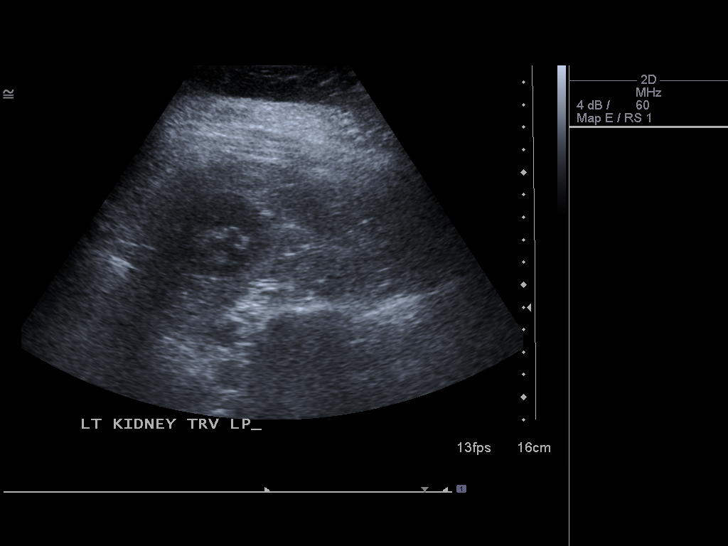
[im 64/64]
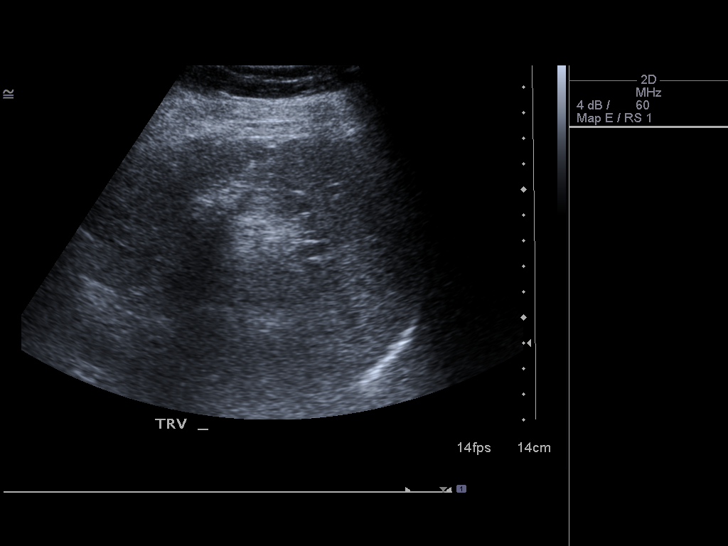

[14 of 25 positions shown; findings below may reference images not displayed]

FINDINGS: Gallbladder:  The patient was not n.p.o. for the procedure.
Gallbladder is contracted.  Difficult to evaluate for gallbladder
wall thickening due to the contracted state.  The patient was
tender over the gallbladder during the study. No visible
gallstones.

Common bile duct:   Normal caliber, 2 mm.

Liver:  No focal lesion identified.  Within normal limits in
parenchymal echogenicity.

IVC:  Appears normal. Limited visualization due to overlying bowel
gas.

Pancreas:  No focal abnormality seen. Limited visualization due to
overlying bowel gas.

Spleen:  Within normal limits in size and echotexture.

Right Kidney:   Normal in size and parenchymal echogenicity.  No
evidence of mass or hydronephrosis.

Left Kidney:  Normal in size and parenchymal echogenicity.  No
evidence of mass or hydronephrosis.

Abdominal aorta:  No aneurysm identified.
IMPRESSION: Gallbladder is contracted, likely related to recent meal.  Mild
tenderness over the gallbladder during the study.  Recommend
clinical correlation.  No gallstones visualized.

## 2013-04-07 ENCOUNTER — Telehealth: Payer: Self-pay | Admitting: *Deleted

## 2013-04-07 DIAGNOSIS — J328 Other chronic sinusitis: Secondary | ICD-10-CM | POA: Diagnosis not present

## 2013-04-07 DIAGNOSIS — F411 Generalized anxiety disorder: Secondary | ICD-10-CM | POA: Diagnosis not present

## 2013-04-07 MED ORDER — NYSTATIN 100000 UNIT/ML MT SUSP: 500000.0000 [IU] | Freq: Four times a day (QID) | OROMUCOSAL | Status: DC

## 2013-04-07 NOTE — Telephone Encounter (Signed)
Called and informed pt that Rx has been sent. She wanted to know if she could be sooner than Friday she has had mucous in her stools and diarrhea. No appts earlier.Jennifer [name] [place]

## 2013-04-08 DIAGNOSIS — R0602 Shortness of breath: Secondary | ICD-10-CM | POA: Diagnosis not present

## 2013-04-08 DIAGNOSIS — M889 Osteitis deformans of unspecified bone: Secondary | ICD-10-CM | POA: Diagnosis not present

## 2013-04-08 DIAGNOSIS — Z885 Allergy status to narcotic agent status: Secondary | ICD-10-CM | POA: Diagnosis not present

## 2013-04-08 DIAGNOSIS — R079 Chest pain, unspecified: Secondary | ICD-10-CM | POA: Diagnosis not present

## 2013-04-08 DIAGNOSIS — D509 Iron deficiency anemia, unspecified: Secondary | ICD-10-CM | POA: Diagnosis not present

## 2013-04-08 DIAGNOSIS — Z880 Allergy status to penicillin: Secondary | ICD-10-CM | POA: Diagnosis not present

## 2013-04-08 DIAGNOSIS — I10 Essential (primary) hypertension: Secondary | ICD-10-CM | POA: Diagnosis not present

## 2013-04-08 DIAGNOSIS — M542 Cervicalgia: Secondary | ICD-10-CM | POA: Diagnosis not present

## 2013-04-08 DIAGNOSIS — C549 Malignant neoplasm of corpus uteri, unspecified: Secondary | ICD-10-CM | POA: Diagnosis not present

## 2013-04-08 DIAGNOSIS — F411 Generalized anxiety disorder: Secondary | ICD-10-CM | POA: Diagnosis not present

## 2013-04-08 DIAGNOSIS — Z886 Allergy status to analgesic agent status: Secondary | ICD-10-CM | POA: Diagnosis not present

## 2013-04-08 DIAGNOSIS — R220 Localized swelling, mass and lump, head: Secondary | ICD-10-CM | POA: Diagnosis not present

## 2013-04-08 DIAGNOSIS — E785 Hyperlipidemia, unspecified: Secondary | ICD-10-CM | POA: Diagnosis not present

## 2013-04-08 DIAGNOSIS — J45909 Unspecified asthma, uncomplicated: Secondary | ICD-10-CM | POA: Diagnosis not present

## 2013-04-08 DIAGNOSIS — R059 Cough, unspecified: Secondary | ICD-10-CM | POA: Diagnosis not present

## 2013-04-08 DIAGNOSIS — J019 Acute sinusitis, unspecified: Secondary | ICD-10-CM | POA: Diagnosis not present

## 2013-04-08 DIAGNOSIS — Z79899 Other long term (current) drug therapy: Secondary | ICD-10-CM | POA: Diagnosis not present

## 2013-04-08 DIAGNOSIS — E269 Hyperaldosteronism, unspecified: Secondary | ICD-10-CM | POA: Diagnosis not present

## 2013-04-08 DIAGNOSIS — J329 Chronic sinusitis, unspecified: Secondary | ICD-10-CM | POA: Diagnosis not present

## 2013-04-08 DIAGNOSIS — R05 Cough: Secondary | ICD-10-CM | POA: Diagnosis not present

## 2013-04-08 DIAGNOSIS — R51 Headache: Secondary | ICD-10-CM | POA: Diagnosis not present

## 2013-04-08 DIAGNOSIS — Z888 Allergy status to other drugs, medicaments and biological substances status: Secondary | ICD-10-CM | POA: Diagnosis not present

## 2013-04-08 DIAGNOSIS — Z883 Allergy status to other anti-infective agents status: Secondary | ICD-10-CM | POA: Diagnosis not present

## 2013-04-08 DIAGNOSIS — K219 Gastro-esophageal reflux disease without esophagitis: Secondary | ICD-10-CM | POA: Diagnosis not present

## 2013-04-08 DIAGNOSIS — B3781 Candidal esophagitis: Secondary | ICD-10-CM | POA: Diagnosis not present

## 2013-04-08 DIAGNOSIS — R0609 Other forms of dyspnea: Secondary | ICD-10-CM | POA: Diagnosis not present

## 2013-04-09 DIAGNOSIS — C549 Malignant neoplasm of corpus uteri, unspecified: Secondary | ICD-10-CM | POA: Diagnosis not present

## 2013-04-10 DIAGNOSIS — Z881 Allergy status to other antibiotic agents status: Secondary | ICD-10-CM | POA: Diagnosis not present

## 2013-04-10 DIAGNOSIS — Z882 Allergy status to sulfonamides status: Secondary | ICD-10-CM | POA: Diagnosis not present

## 2013-04-10 DIAGNOSIS — Z886 Allergy status to analgesic agent status: Secondary | ICD-10-CM | POA: Diagnosis not present

## 2013-04-10 DIAGNOSIS — I10 Essential (primary) hypertension: Secondary | ICD-10-CM | POA: Diagnosis not present

## 2013-04-10 DIAGNOSIS — R109 Unspecified abdominal pain: Secondary | ICD-10-CM | POA: Diagnosis not present

## 2013-04-10 DIAGNOSIS — Z888 Allergy status to other drugs, medicaments and biological substances status: Secondary | ICD-10-CM | POA: Diagnosis not present

## 2013-04-10 DIAGNOSIS — R1013 Epigastric pain: Secondary | ICD-10-CM | POA: Diagnosis not present

## 2013-04-10 DIAGNOSIS — Z884 Allergy status to anesthetic agent status: Secondary | ICD-10-CM | POA: Diagnosis not present

## 2013-04-10 DIAGNOSIS — Z711 Person with feared health complaint in whom no diagnosis is made: Secondary | ICD-10-CM | POA: Diagnosis not present

## 2013-04-10 DIAGNOSIS — Z883 Allergy status to other anti-infective agents status: Secondary | ICD-10-CM | POA: Diagnosis not present

## 2013-04-10 DIAGNOSIS — Z880 Allergy status to penicillin: Secondary | ICD-10-CM | POA: Diagnosis not present

## 2013-04-10 DIAGNOSIS — R112 Nausea with vomiting, unspecified: Secondary | ICD-10-CM | POA: Diagnosis not present

## 2013-04-10 DIAGNOSIS — Z79899 Other long term (current) drug therapy: Secondary | ICD-10-CM | POA: Diagnosis not present

## 2013-04-11 ENCOUNTER — Ambulatory Visit (INDEPENDENT_AMBULATORY_CARE_PROVIDER_SITE_OTHER): Payer: Medicare Other | Admitting: Family Medicine

## 2013-04-11 ENCOUNTER — Encounter: Payer: Self-pay | Admitting: Family Medicine

## 2013-04-11 VITALS — BP 142/98 | HR 86 | Wt 187.0 lb

## 2013-04-11 DIAGNOSIS — R110 Nausea: Secondary | ICD-10-CM | POA: Diagnosis not present

## 2013-04-11 DIAGNOSIS — Z880 Allergy status to penicillin: Secondary | ICD-10-CM | POA: Diagnosis not present

## 2013-04-11 DIAGNOSIS — J329 Chronic sinusitis, unspecified: Secondary | ICD-10-CM

## 2013-04-11 DIAGNOSIS — Z884 Allergy status to anesthetic agent status: Secondary | ICD-10-CM | POA: Diagnosis not present

## 2013-04-11 DIAGNOSIS — K2970 Gastritis, unspecified, without bleeding: Secondary | ICD-10-CM | POA: Diagnosis not present

## 2013-04-11 DIAGNOSIS — R10819 Abdominal tenderness, unspecified site: Secondary | ICD-10-CM | POA: Diagnosis not present

## 2013-04-11 DIAGNOSIS — Z886 Allergy status to analgesic agent status: Secondary | ICD-10-CM | POA: Diagnosis not present

## 2013-04-11 DIAGNOSIS — I10 Essential (primary) hypertension: Secondary | ICD-10-CM

## 2013-04-11 DIAGNOSIS — Z79899 Other long term (current) drug therapy: Secondary | ICD-10-CM | POA: Diagnosis not present

## 2013-04-11 DIAGNOSIS — R1011 Right upper quadrant pain: Secondary | ICD-10-CM | POA: Diagnosis not present

## 2013-04-11 DIAGNOSIS — Z888 Allergy status to other drugs, medicaments and biological substances status: Secondary | ICD-10-CM | POA: Diagnosis not present

## 2013-04-11 DIAGNOSIS — K8020 Calculus of gallbladder without cholecystitis without obstruction: Secondary | ICD-10-CM | POA: Diagnosis not present

## 2013-04-11 DIAGNOSIS — Z881 Allergy status to other antibiotic agents status: Secondary | ICD-10-CM | POA: Diagnosis not present

## 2013-04-11 DIAGNOSIS — R112 Nausea with vomiting, unspecified: Secondary | ICD-10-CM | POA: Diagnosis not present

## 2013-04-11 DIAGNOSIS — G894 Chronic pain syndrome: Secondary | ICD-10-CM | POA: Diagnosis not present

## 2013-04-11 NOTE — Progress Notes (Signed)
Subjective:    Patient ID: Jennifer [name], female    DOB: [birth year], 47 y.o.   MRN: [id number]  HPI HTN -she is having some mild chest pain. She says it starts the right upper quadrant and occasionally shoots up towards the right breast. No shortness of breath currently but she is having a lot of sinus symptoms. She has not tried the Cardizem 30 mg 4 times a day dose that her cardiologist gave her. She says she just really nervous and anxious.  Did compete the clindamcyn and felt better but now feels like getting congested again and feels she is getting sick again.  Started the tobramycing nasal NEB and broke out with a rash on her face so had to stop it.  They want her to start her on a new antibiotic that she said she had a reaction 2 years ago. I believe it cephalexin. She became tearful in the office thinking about what could happen if she has a reaction to it.  Had IV and fluids in her left uppper arm and she says has been sore and swollen since then. No heat or inc warmth.  No fever, chills, or sweats.    Has pain in the RUQ and near the epigastrum.  Will radiate to her back.  Feels like a shooting cold pain.  Had normal colonoscopy.  Worse with eating.  ON [facility] at [facility] on 10/17/2012 saw small gallstones. No infammatory changes. Says has been going on for months and has been getting worse. Says can feel it shoot into her breast when cough.  Says pain in in same area.  Review of Systems     Objective:   Physical Exam  Constitutional: She is oriented to person, place, and time. She appears well-developed and well-nourished.  HENT:  Head: Normocephalic and atraumatic.  Abdominal: Soft. Bowel sounds are normal. She exhibits no distension and no mass. There is tenderness. There is no rebound and no guarding.  TTP in the RUQ.    Neurological: She is alert and oriented to person, place, and time.  Skin: Skin is warm and dry.  Psychiatric: She has a normal mood and affect.  Her behavior is normal.          Assessment & Plan:  RUQ pain with Gallstones - will refer to surgeon.  I do think she has enough clinical symptoms with a history of gallstones to warrant possible cholecystectomy. We will go ahead and get a CBC and an ultrasound just to make sure there is no sign of acute cholecystitis but I suspect that it would be fairly normal except for positive for gallstones. Certainly if she gets worse over the weekend she is to go to the emergency room. But otherwise I think we can schedule her with the surgeon in [facility] per her preference.  Hypertension-encouraged her to take the medication well she has someone home. I reassured her that the Cardizem is the lowest dose and that it is 4 times a day dosing which means it. Short acting so she doesn't feel well it should wear off fairly quickly. She says she thinks she can get her husband is at home with her today to make sure that she feels well. She does feel dizzy encourage her to work on hydration and to lie down and she feels better.  Sinusitis-she's nervous about starting an antibiotic because she has had a reaction to it in the past though it was years ago. I offered  to let her come in on Tuesday said she did take her medications while she is here and it would last for several hours to make sure that she's tolerating it well. She says she will think about it but may decide to go to the emergency room to be monitored if she decides to take the medication.

## 2013-04-12 DIAGNOSIS — E269 Hyperaldosteronism, unspecified: Secondary | ICD-10-CM | POA: Diagnosis not present

## 2013-04-12 DIAGNOSIS — J45909 Unspecified asthma, uncomplicated: Secondary | ICD-10-CM | POA: Diagnosis not present

## 2013-04-12 DIAGNOSIS — R109 Unspecified abdominal pain: Secondary | ICD-10-CM | POA: Diagnosis not present

## 2013-04-12 DIAGNOSIS — F411 Generalized anxiety disorder: Secondary | ICD-10-CM | POA: Diagnosis not present

## 2013-04-12 DIAGNOSIS — Z87891 Personal history of nicotine dependence: Secondary | ICD-10-CM | POA: Diagnosis not present

## 2013-04-12 DIAGNOSIS — Z881 Allergy status to other antibiotic agents status: Secondary | ICD-10-CM | POA: Diagnosis not present

## 2013-04-12 DIAGNOSIS — Z883 Allergy status to other anti-infective agents status: Secondary | ICD-10-CM | POA: Diagnosis not present

## 2013-04-12 DIAGNOSIS — R1011 Right upper quadrant pain: Secondary | ICD-10-CM | POA: Diagnosis not present

## 2013-04-12 DIAGNOSIS — Z882 Allergy status to sulfonamides status: Secondary | ICD-10-CM | POA: Diagnosis not present

## 2013-04-12 DIAGNOSIS — Z888 Allergy status to other drugs, medicaments and biological substances status: Secondary | ICD-10-CM | POA: Diagnosis not present

## 2013-04-12 DIAGNOSIS — Z880 Allergy status to penicillin: Secondary | ICD-10-CM | POA: Diagnosis not present

## 2013-04-12 DIAGNOSIS — R1110 Vomiting, unspecified: Secondary | ICD-10-CM | POA: Diagnosis not present

## 2013-04-12 DIAGNOSIS — Z79899 Other long term (current) drug therapy: Secondary | ICD-10-CM | POA: Diagnosis not present

## 2013-04-12 DIAGNOSIS — K828 Other specified diseases of gallbladder: Secondary | ICD-10-CM | POA: Diagnosis not present

## 2013-04-12 DIAGNOSIS — K8020 Calculus of gallbladder without cholecystitis without obstruction: Secondary | ICD-10-CM | POA: Diagnosis not present

## 2013-04-12 DIAGNOSIS — I472 Ventricular tachycardia: Secondary | ICD-10-CM | POA: Diagnosis not present

## 2013-04-12 DIAGNOSIS — E785 Hyperlipidemia, unspecified: Secondary | ICD-10-CM | POA: Diagnosis not present

## 2013-04-12 DIAGNOSIS — N200 Calculus of kidney: Secondary | ICD-10-CM | POA: Diagnosis not present

## 2013-04-12 DIAGNOSIS — Z886 Allergy status to analgesic agent status: Secondary | ICD-10-CM | POA: Diagnosis not present

## 2013-04-12 DIAGNOSIS — K219 Gastro-esophageal reflux disease without esophagitis: Secondary | ICD-10-CM | POA: Diagnosis not present

## 2013-04-12 DIAGNOSIS — I4729 Other ventricular tachycardia: Secondary | ICD-10-CM | POA: Diagnosis not present

## 2013-04-12 DIAGNOSIS — I10 Essential (primary) hypertension: Secondary | ICD-10-CM | POA: Diagnosis not present

## 2013-04-15 ENCOUNTER — Telehealth: Payer: Self-pay | Admitting: *Deleted

## 2013-04-15 DIAGNOSIS — I10 Essential (primary) hypertension: Secondary | ICD-10-CM | POA: Diagnosis not present

## 2013-04-15 DIAGNOSIS — E269 Hyperaldosteronism, unspecified: Secondary | ICD-10-CM | POA: Diagnosis not present

## 2013-04-15 DIAGNOSIS — R5381 Other malaise: Secondary | ICD-10-CM | POA: Diagnosis not present

## 2013-04-15 DIAGNOSIS — R002 Palpitations: Secondary | ICD-10-CM | POA: Diagnosis not present

## 2013-04-15 DIAGNOSIS — Z79899 Other long term (current) drug therapy: Secondary | ICD-10-CM | POA: Diagnosis not present

## 2013-04-15 DIAGNOSIS — Z8673 Personal history of transient ischemic attack (TIA), and cerebral infarction without residual deficits: Secondary | ICD-10-CM | POA: Diagnosis not present

## 2013-04-15 DIAGNOSIS — F459 Somatoform disorder, unspecified: Secondary | ICD-10-CM | POA: Diagnosis not present

## 2013-04-15 DIAGNOSIS — Z888 Allergy status to other drugs, medicaments and biological substances status: Secondary | ICD-10-CM | POA: Diagnosis not present

## 2013-04-15 DIAGNOSIS — K2270 Barrett's esophagus without dysplasia: Secondary | ICD-10-CM | POA: Diagnosis not present

## 2013-04-15 DIAGNOSIS — G894 Chronic pain syndrome: Secondary | ICD-10-CM | POA: Diagnosis not present

## 2013-04-15 NOTE — Telephone Encounter (Signed)
Pt called and informed that she is feeling bad and is hurting. She stated that when she takes the Diltiazem she begins sweat and her feet and hands hurt she said that it just makes her feel bad last night she didn't sleep well and she hasn't taken today's dose. She wants to know what she should do. She still has some micardis left.Laureen Ochs, Viann Shove

## 2013-04-15 NOTE — Telephone Encounter (Signed)
Ok to restart the micardis.

## 2013-04-15 NOTE — Telephone Encounter (Signed)
Pt called and told to restart micardis . She reports that she called the prescribing dr. Charline Bills that she should take the ditiazem er 120 mg. Her heart is racing and was told that the meds are to help with this. She stated that that it makes her feel really bad. I told her to take the micardis the way that it was prescribed to her before. Pt is really anxious and stressed, and frustrated about what she should do. I told her to try and relax and try to stay hydrated with ice chips since it is hard for her to drink I advised her that if she began to feel worse to report to the ED. Also pt would like to be referred to another Cardiologist that is NOT with [facility].[name]

## 2013-04-17 DIAGNOSIS — F339 Major depressive disorder, recurrent, unspecified: Secondary | ICD-10-CM | POA: Diagnosis not present

## 2013-04-18 ENCOUNTER — Encounter: Payer: Self-pay | Admitting: Family Medicine

## 2013-04-18 ENCOUNTER — Ambulatory Visit (INDEPENDENT_AMBULATORY_CARE_PROVIDER_SITE_OTHER): Payer: Medicare Other | Admitting: Family Medicine

## 2013-04-18 VITALS — BP 139/87 | HR 97 | Wt 187.0 lb

## 2013-04-18 DIAGNOSIS — R0982 Postnasal drip: Secondary | ICD-10-CM

## 2013-04-18 DIAGNOSIS — J329 Chronic sinusitis, unspecified: Secondary | ICD-10-CM

## 2013-04-18 DIAGNOSIS — R110 Nausea: Secondary | ICD-10-CM

## 2013-04-18 DIAGNOSIS — C549 Malignant neoplasm of corpus uteri, unspecified: Secondary | ICD-10-CM | POA: Diagnosis not present

## 2013-04-18 DIAGNOSIS — K811 Chronic cholecystitis: Secondary | ICD-10-CM | POA: Diagnosis not present

## 2013-04-18 DIAGNOSIS — E860 Dehydration: Secondary | ICD-10-CM

## 2013-04-18 DIAGNOSIS — C541 Malignant neoplasm of endometrium: Secondary | ICD-10-CM

## 2013-04-18 DIAGNOSIS — I10 Essential (primary) hypertension: Secondary | ICD-10-CM

## 2013-04-18 MED ORDER — LEVOFLOXACIN 250 MG PO TABS: 250.0000 mg | ORAL_TABLET | Freq: Every day | ORAL | Status: DC

## 2013-04-18 NOTE — Progress Notes (Signed)
Subjective:    Patient ID: Jennifer [name], female    DOB: 11/03/1965, 47 y.o.   MRN: [id number]  HPI Friday night was hving sweats and felt dehydrated and had vomited. Went to [facility] via EMS.  Says they didn't do anything. Next day went to [facility] and given IV fluids.  Had abd [place] as well, that she was told was normal  Says still feels nauseated with intake. Tried to hydrated yesterday.  Has been coughing some thick sputum. She says she feels it's making her nauseated as well..  Getting thick post nasal drip again. I asked if she did try to take her Zofran to help with the nausea. Says sometims says the zofran doesn't seem to help her nausea.   HTN - Restarted the micardis 20mg .  Says didn't fell well on the cardizem. Says would sweat.  Says her HR has been over 100 this AM .Worried it may be from the medicatoin. I really think that so far this is going to be commenced medication for her to be able to tolerate. Her monitor that does not cause tachycardia or bradycardia. It should not affect heart rate at all.  She plans on going back to [facility] to have her hysterectomy for her endometrial carcinoma. She did see Dr. Hyacinth Meeker who travels from [place] to surgery. She was not able to make arrangements with her regular GYN to help discharge her from the hospital without having to go to [place] for the surgery itself. The she preferred to have it done locally so plans to go back to [facility].  i asked her what she thought about seeing a surgeon for consult for cholecystectomy. She really would like to see Dr. Adolphus Birchwood. Evidently he is changing practices to the bariatric clinic here and [place]. They said that he would start taking new patients as of September 20 which is about 2 weeks away.  Review of Systems     Objective:   Physical Exam  Constitutional: She is oriented to person, place, and time. She appears well-developed and well-nourished.  HENT:  Head: Normocephalic and atraumatic.  Right Ear:  External ear normal.  Left Ear: External ear normal.  Nose: Nose normal.  Mouth/Throat: Oropharynx is clear and moist.  TMs and canals are clear. Nasal mucosa is very erythematous and irritated. Turbinates are mildly swollen.  Eyes: Conjunctivae and EOM are normal. Pupils are equal, round, and reactive to light.  Neck: Neck supple. No thyromegaly present.  Cardiovascular: Normal rate, regular rhythm and normal heart sounds.   Pulmonary/Chest: Effort normal and breath sounds normal. She has no wheezes.  Lymphadenopathy:    She has no cervical adenopathy.  Neurological: She is alert and oriented to person, place, and time.  Skin: Skin is warm and dry.  Psychiatric: She has a normal mood and affect. Her behavior is normal. Thought content normal.          Assessment & Plan:  Nausea - try to work on fluid intake today to stay hydrated. Can try the Zofran today if needed. I think the nausea is mostly secondary to the postnasal drip.  Sinusitis-I reviewed her cultures that were done through the ear nose and throat specialist. She still has not taken the Ceftin that he had prescribed. She's extremely nurse about potential reaction to the drug. Plasma saw her she said she might emergency room to be monitored while she takes the medication. I did review the cultures and both cultures were sensitive to Levaquin. She has taken  that at low doses and tolerated it fairly well compared to other medications. She is willing to at least try that. Levaquin 250 mg at bedtime x7-10 days.  HTN - much improved today. Continue the Micardis. Additional samples given today. HR is up but not over 100. There reportedly this morning it was over 100. I really think this is more to do with her stress levels and I do think she has transient tachycardia. I reminded her again that this often happens when she is not taking blood pressure medications it's difficult to say it's actually the Micardis causing it.  Chronic  cholecystitis-I. would like her to see Dr. Adolphus Birchwood for consult for possible cholecystectomy. I really think be causing a lot of her right upper quadrant pain and nausea.  Endometrial carcinoma-encouraged her to go ahead and schedule her surgery ASAP with [facility].  Declined flu vaccine today

## 2013-04-21 DIAGNOSIS — H9209 Otalgia, unspecified ear: Secondary | ICD-10-CM | POA: Diagnosis not present

## 2013-04-21 DIAGNOSIS — J029 Acute pharyngitis, unspecified: Secondary | ICD-10-CM | POA: Diagnosis not present

## 2013-04-21 DIAGNOSIS — J3489 Other specified disorders of nose and nasal sinuses: Secondary | ICD-10-CM | POA: Diagnosis not present

## 2013-04-21 DIAGNOSIS — J329 Chronic sinusitis, unspecified: Secondary | ICD-10-CM | POA: Diagnosis not present

## 2013-04-21 DIAGNOSIS — R1310 Dysphagia, unspecified: Secondary | ICD-10-CM | POA: Diagnosis not present

## 2013-04-22 ENCOUNTER — Ambulatory Visit: Payer: Medicare Other | Admitting: Family Medicine

## 2013-04-22 DIAGNOSIS — J45901 Unspecified asthma with (acute) exacerbation: Secondary | ICD-10-CM | POA: Diagnosis not present

## 2013-04-23 DIAGNOSIS — M542 Cervicalgia: Secondary | ICD-10-CM | POA: Diagnosis not present

## 2013-04-23 DIAGNOSIS — R0602 Shortness of breath: Secondary | ICD-10-CM | POA: Diagnosis not present

## 2013-04-23 DIAGNOSIS — R059 Cough, unspecified: Secondary | ICD-10-CM | POA: Diagnosis not present

## 2013-04-23 DIAGNOSIS — R079 Chest pain, unspecified: Secondary | ICD-10-CM | POA: Diagnosis not present

## 2013-04-23 DIAGNOSIS — I10 Essential (primary) hypertension: Secondary | ICD-10-CM | POA: Diagnosis not present

## 2013-04-23 DIAGNOSIS — Z79899 Other long term (current) drug therapy: Secondary | ICD-10-CM | POA: Diagnosis not present

## 2013-04-23 DIAGNOSIS — J209 Acute bronchitis, unspecified: Secondary | ICD-10-CM | POA: Diagnosis not present

## 2013-04-23 DIAGNOSIS — R05 Cough: Secondary | ICD-10-CM | POA: Diagnosis not present

## 2013-04-24 ENCOUNTER — Ambulatory Visit: Payer: Medicare Other | Admitting: Sports Medicine

## 2013-04-25 DIAGNOSIS — J383 Other diseases of vocal cords: Secondary | ICD-10-CM | POA: Diagnosis not present

## 2013-04-25 DIAGNOSIS — R0609 Other forms of dyspnea: Secondary | ICD-10-CM | POA: Diagnosis not present

## 2013-04-25 DIAGNOSIS — K219 Gastro-esophageal reflux disease without esophagitis: Secondary | ICD-10-CM | POA: Diagnosis not present

## 2013-04-25 DIAGNOSIS — R195 Other fecal abnormalities: Secondary | ICD-10-CM | POA: Diagnosis not present

## 2013-04-25 DIAGNOSIS — J328 Other chronic sinusitis: Secondary | ICD-10-CM | POA: Diagnosis not present

## 2013-04-25 DIAGNOSIS — J301 Allergic rhinitis due to pollen: Secondary | ICD-10-CM | POA: Diagnosis not present

## 2013-04-25 DIAGNOSIS — F411 Generalized anxiety disorder: Secondary | ICD-10-CM | POA: Diagnosis not present

## 2013-04-25 DIAGNOSIS — J45909 Unspecified asthma, uncomplicated: Secondary | ICD-10-CM | POA: Diagnosis not present

## 2013-04-28 ENCOUNTER — Emergency Department (HOSPITAL_BASED_OUTPATIENT_CLINIC_OR_DEPARTMENT_OTHER)
Admission: EM | Admit: 2013-04-28 | Discharge: 2013-04-28 | Disposition: A | Discharge status: Home / Self Care | Payer: Medicare Other | Attending: Emergency Medicine | Admitting: Emergency Medicine

## 2013-04-28 ENCOUNTER — Encounter (HOSPITAL_BASED_OUTPATIENT_CLINIC_OR_DEPARTMENT_OTHER): Payer: Self-pay | Admitting: Student

## 2013-04-28 DIAGNOSIS — I498 Other specified cardiac arrhythmias: Secondary | ICD-10-CM | POA: Diagnosis not present

## 2013-04-28 DIAGNOSIS — Z9889 Other specified postprocedural states: Secondary | ICD-10-CM | POA: Diagnosis not present

## 2013-04-28 DIAGNOSIS — R509 Fever, unspecified: Secondary | ICD-10-CM | POA: Insufficient documentation

## 2013-04-28 DIAGNOSIS — Z9089 Acquired absence of other organs: Secondary | ICD-10-CM | POA: Diagnosis not present

## 2013-04-28 DIAGNOSIS — G4730 Sleep apnea, unspecified: Secondary | ICD-10-CM | POA: Insufficient documentation

## 2013-04-28 DIAGNOSIS — Z8614 Personal history of Methicillin resistant Staphylococcus aureus infection: Secondary | ICD-10-CM | POA: Diagnosis not present

## 2013-04-28 DIAGNOSIS — G8929 Other chronic pain: Secondary | ICD-10-CM | POA: Diagnosis not present

## 2013-04-28 DIAGNOSIS — Z79899 Other long term (current) drug therapy: Secondary | ICD-10-CM | POA: Insufficient documentation

## 2013-04-28 DIAGNOSIS — R519 Headache, unspecified: Secondary | ICD-10-CM

## 2013-04-28 DIAGNOSIS — Z9861 Coronary angioplasty status: Secondary | ICD-10-CM | POA: Diagnosis not present

## 2013-04-28 DIAGNOSIS — Z8659 Personal history of other mental and behavioral disorders: Secondary | ICD-10-CM | POA: Insufficient documentation

## 2013-04-28 DIAGNOSIS — E559 Vitamin D deficiency, unspecified: Secondary | ICD-10-CM | POA: Insufficient documentation

## 2013-04-28 DIAGNOSIS — K219 Gastro-esophageal reflux disease without esophagitis: Secondary | ICD-10-CM | POA: Diagnosis not present

## 2013-04-28 DIAGNOSIS — Z9851 Tubal ligation status: Secondary | ICD-10-CM | POA: Insufficient documentation

## 2013-04-28 DIAGNOSIS — R51 Headache: Secondary | ICD-10-CM | POA: Diagnosis not present

## 2013-04-28 DIAGNOSIS — F339 Major depressive disorder, recurrent, unspecified: Secondary | ICD-10-CM | POA: Diagnosis not present

## 2013-04-28 DIAGNOSIS — IMO0002 Reserved for concepts with insufficient information to code with codable children: Secondary | ICD-10-CM | POA: Diagnosis not present

## 2013-04-28 DIAGNOSIS — Z792 Long term (current) use of antibiotics: Secondary | ICD-10-CM | POA: Insufficient documentation

## 2013-04-28 DIAGNOSIS — H538 Other visual disturbances: Secondary | ICD-10-CM | POA: Diagnosis not present

## 2013-04-28 DIAGNOSIS — R109 Unspecified abdominal pain: Secondary | ICD-10-CM | POA: Diagnosis not present

## 2013-04-28 DIAGNOSIS — Z85828 Personal history of other malignant neoplasm of skin: Secondary | ICD-10-CM | POA: Insufficient documentation

## 2013-04-28 DIAGNOSIS — Z8544 Personal history of malignant neoplasm of other female genital organs: Secondary | ICD-10-CM | POA: Insufficient documentation

## 2013-04-28 DIAGNOSIS — J45909 Unspecified asthma, uncomplicated: Secondary | ICD-10-CM | POA: Diagnosis not present

## 2013-04-28 DIAGNOSIS — Z862 Personal history of diseases of the blood and blood-forming organs and certain disorders involving the immune mechanism: Secondary | ICD-10-CM | POA: Insufficient documentation

## 2013-04-28 DIAGNOSIS — I10 Essential (primary) hypertension: Secondary | ICD-10-CM | POA: Diagnosis not present

## 2013-04-28 DIAGNOSIS — Z87891 Personal history of nicotine dependence: Secondary | ICD-10-CM | POA: Diagnosis not present

## 2013-04-28 DIAGNOSIS — Z880 Allergy status to penicillin: Secondary | ICD-10-CM | POA: Diagnosis not present

## 2013-04-28 DIAGNOSIS — D649 Anemia, unspecified: Secondary | ICD-10-CM | POA: Insufficient documentation

## 2013-04-28 DIAGNOSIS — Z8639 Personal history of other endocrine, nutritional and metabolic disease: Secondary | ICD-10-CM | POA: Insufficient documentation

## 2013-04-28 LAB — SEDIMENTATION RATE: Sed Rate: 21 mm/hr (ref 0–22)

## 2013-04-28 LAB — GLUCOSE, CAPILLARY: Glucose-Capillary: 68 mg/dL — ABNORMAL LOW (ref 70–99)

## 2013-04-28 MED ORDER — MEPERIDINE HCL 50 MG/5ML PO SOLN: 50.0000 mg | Freq: Four times a day (QID) | ORAL | Status: DC | PRN

## 2013-04-28 NOTE — ED Notes (Signed)
Blurry vision x 1 week, headache, groin pain and swelling, multiple complaints

## 2013-04-28 NOTE — ED Notes (Addendum)
Pt advised her head was "really hurting" and asked for update.

## 2013-04-28 NOTE — ED Notes (Signed)
Pt states "I'm feeling bad. Whenever my sugar drops below 80 I feel bad" CBG checked and was found to be 68. Pt juice juice, peanut butter and graham crackers. States she would like to see the doctor again. EDPA Sofia notified.

## 2013-04-28 NOTE — ED Provider Notes (Signed)
Medical screening examination/treatment/procedure(s) were performed by non-physician practitioner and as supervising physician I was immediately available for consultation/collaboration.   Gwyneth Sprout, MD 04/28/13 2322
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
## 2013-04-28 NOTE — ED Notes (Signed)
PA at bedside.

## 2013-04-29 ENCOUNTER — Ambulatory Visit: Payer: Medicare Other | Admitting: Family Medicine

## 2013-04-29 DIAGNOSIS — R51 Headache: Secondary | ICD-10-CM | POA: Diagnosis not present

## 2013-04-29 DIAGNOSIS — Z886 Allergy status to analgesic agent status: Secondary | ICD-10-CM | POA: Diagnosis not present

## 2013-04-29 DIAGNOSIS — R404 Transient alteration of awareness: Secondary | ICD-10-CM | POA: Diagnosis not present

## 2013-04-29 DIAGNOSIS — J45909 Unspecified asthma, uncomplicated: Secondary | ICD-10-CM | POA: Diagnosis not present

## 2013-04-29 DIAGNOSIS — Z87891 Personal history of nicotine dependence: Secondary | ICD-10-CM | POA: Diagnosis not present

## 2013-04-29 DIAGNOSIS — F411 Generalized anxiety disorder: Secondary | ICD-10-CM | POA: Diagnosis not present

## 2013-04-29 DIAGNOSIS — E785 Hyperlipidemia, unspecified: Secondary | ICD-10-CM | POA: Diagnosis not present

## 2013-04-29 DIAGNOSIS — I471 Supraventricular tachycardia: Secondary | ICD-10-CM | POA: Diagnosis not present

## 2013-04-29 DIAGNOSIS — Z91010 Allergy to peanuts: Secondary | ICD-10-CM | POA: Diagnosis not present

## 2013-04-29 DIAGNOSIS — Z79899 Other long term (current) drug therapy: Secondary | ICD-10-CM | POA: Diagnosis not present

## 2013-04-29 DIAGNOSIS — Z882 Allergy status to sulfonamides status: Secondary | ICD-10-CM | POA: Diagnosis not present

## 2013-04-29 DIAGNOSIS — Z9089 Acquired absence of other organs: Secondary | ICD-10-CM | POA: Diagnosis not present

## 2013-04-29 DIAGNOSIS — Z91018 Allergy to other foods: Secondary | ICD-10-CM | POA: Diagnosis not present

## 2013-04-29 DIAGNOSIS — Z880 Allergy status to penicillin: Secondary | ICD-10-CM | POA: Diagnosis not present

## 2013-04-29 DIAGNOSIS — D649 Anemia, unspecified: Secondary | ICD-10-CM | POA: Diagnosis not present

## 2013-04-29 DIAGNOSIS — K219 Gastro-esophageal reflux disease without esophagitis: Secondary | ICD-10-CM | POA: Diagnosis not present

## 2013-04-29 DIAGNOSIS — Z885 Allergy status to narcotic agent status: Secondary | ICD-10-CM | POA: Diagnosis not present

## 2013-04-29 DIAGNOSIS — E269 Hyperaldosteronism, unspecified: Secondary | ICD-10-CM | POA: Diagnosis not present

## 2013-04-29 DIAGNOSIS — Z883 Allergy status to other anti-infective agents status: Secondary | ICD-10-CM | POA: Diagnosis not present

## 2013-04-29 DIAGNOSIS — Z888 Allergy status to other drugs, medicaments and biological substances status: Secondary | ICD-10-CM | POA: Diagnosis not present

## 2013-04-29 DIAGNOSIS — I10 Essential (primary) hypertension: Secondary | ICD-10-CM | POA: Diagnosis not present

## 2013-05-01 ENCOUNTER — Encounter: Payer: Self-pay | Admitting: Family Medicine

## 2013-05-01 ENCOUNTER — Ambulatory Visit (INDEPENDENT_AMBULATORY_CARE_PROVIDER_SITE_OTHER): Payer: Medicare Other | Admitting: Family Medicine

## 2013-05-01 VITALS — BP 135/86 | HR 98 | Wt 188.0 lb

## 2013-05-01 DIAGNOSIS — R220 Localized swelling, mass and lump, head: Secondary | ICD-10-CM

## 2013-05-01 DIAGNOSIS — H04123 Dry eye syndrome of bilateral lacrimal glands: Secondary | ICD-10-CM

## 2013-05-01 DIAGNOSIS — R7301 Impaired fasting glucose: Secondary | ICD-10-CM

## 2013-05-01 DIAGNOSIS — H04129 Dry eye syndrome of unspecified lacrimal gland: Secondary | ICD-10-CM | POA: Insufficient documentation

## 2013-05-01 DIAGNOSIS — I10 Essential (primary) hypertension: Secondary | ICD-10-CM | POA: Diagnosis not present

## 2013-05-01 DIAGNOSIS — R221 Localized swelling, mass and lump, neck: Secondary | ICD-10-CM

## 2013-05-01 DIAGNOSIS — F339 Major depressive disorder, recurrent, unspecified: Secondary | ICD-10-CM | POA: Diagnosis not present

## 2013-05-01 LAB — HEMOGLOBIN A1C
Hgb A1c MFr Bld: 5.8 % — ABNORMAL HIGH (ref <5.7)
Mean Plasma Glucose: 120 mg/dL — ABNORMAL HIGH (ref <117)

## 2013-05-01 MED ORDER — TELMISARTAN 20 MG PO TABS: 20.0000 mg | ORAL_TABLET | Freq: Every day | ORAL | Status: DC

## 2013-05-01 NOTE — Progress Notes (Signed)
  Subjective:    Patient ID: Jennifer [name], female    DOB: 03-14-1966, 47 y.o.   MRN: [id number]  HPI Was seen at [facility] and they are doing some additional tests for her breathing problems. They feel that she may have some breathing issues at the upper part of her throat.  Has started Qnasl. They asked her if certain foods might be triggering swelling in her throat.  Says does have some mold. No allergies as a child.    HTN - She is taking micardis. Not on losartan. So far she's tolerating it well. She's taking 20 mg daily. She has had a few episodes of tachycardia.  Says has been waking up with tissue over her eyes  In AM.  Has been having some pain in the right temple area as well. Went to ED. They did a sed rate.  They checked her sugar and it was 70.  She does have a history of dry eye. She says she's been unable to schedule the father her glucose tolerance test because she really doesn't think she can go that long.   Review of Systems     Objective:   Physical Exam  Constitutional: She is oriented to person, place, and time. She appears well-developed and well-nourished.  HENT:  Head: Normocephalic and atraumatic.  Cardiovascular: Normal rate, regular rhythm and normal heart sounds.   Pulmonary/Chest: Effort normal and breath sounds normal.  Neurological: She is alert and oriented to person, place, and time.  Skin: Skin is warm and dry.  Psychiatric: She has a normal mood and affect. Her behavior is normal.          Assessment & Plan:  HTN- much improved today.  Continue current regimen.  I gave her reassurance that the episodes of tachycardia ugly for completely unrelated. She also has episodes of tachycardia when she's not on blood pressure medication and I reminded her of this. I think that this is actually going to be a good regimen for her. We will continue at the current dose. She might benefit from a mild increase but for now we will continue or doing and  followup in 3 or 4 weeks.  SOB- Working with [facility]. I think reasonablt to do Food Profile testing to see if there may be any type of food allergens that could be triggering her episodes of feeling like she can't breathe at the top of her chest near her throat. She does complain frequently of episodes of feeling like things are swelling and she can't pass food..   IFG - will check A1C today.  It sounds like she may be having some occasional episodes of hypoglycemia. We'll recheck her hemoglobin A1c. She was borderline back in April. Lab Results  Component Value Date   HGBA1C 5.7 11/14/2012   Dry eye syndrome-I. do think that what she is describing is related to her dry eye. I recommend that she see an ophthalmologist for further evaluation and recommendations.

## 2013-05-02 LAB — ALLERGEN FOOD PROFILE SPECIFIC IGE
Apple: <0.1 kU/L
Chicken IgE: <0.1 kU/L
Corn: <0.1 kU/L
Egg White IgE: <0.1 kU/L
Fish Cod: <0.1 kU/L
IgE (Immunoglobulin E), Serum: 10.1 IU/mL (ref 0.0–180.0)
Milk IgE: 0.11 kU/L — ABNORMAL HIGH
Orange: <0.1 kU/L
Peanut IgE: <0.1 kU/L
Shrimp IgE: <0.1 kU/L
Soybean IgE: <0.1 kU/L
Tomato IgE: <0.1 kU/L
Tuna IgE: <0.1 kU/L
Wheat IgE: <0.1 kU/L

## 2013-05-04 DIAGNOSIS — IMO0002 Reserved for concepts with insufficient information to code with codable children: Secondary | ICD-10-CM | POA: Diagnosis not present

## 2013-05-04 DIAGNOSIS — J029 Acute pharyngitis, unspecified: Secondary | ICD-10-CM | POA: Diagnosis not present

## 2013-05-04 DIAGNOSIS — K209 Esophagitis, unspecified without bleeding: Secondary | ICD-10-CM | POA: Diagnosis not present

## 2013-05-06 DIAGNOSIS — K219 Gastro-esophageal reflux disease without esophagitis: Secondary | ICD-10-CM | POA: Diagnosis not present

## 2013-05-06 DIAGNOSIS — J312 Chronic pharyngitis: Secondary | ICD-10-CM | POA: Diagnosis not present

## 2013-05-06 DIAGNOSIS — T7800XA Anaphylactic reaction due to unspecified food, initial encounter: Secondary | ICD-10-CM | POA: Diagnosis not present

## 2013-05-06 DIAGNOSIS — B370 Candidal stomatitis: Secondary | ICD-10-CM | POA: Diagnosis not present

## 2013-05-06 DIAGNOSIS — J328 Other chronic sinusitis: Secondary | ICD-10-CM | POA: Diagnosis not present

## 2013-05-07 DIAGNOSIS — J029 Acute pharyngitis, unspecified: Secondary | ICD-10-CM | POA: Diagnosis not present

## 2013-05-07 DIAGNOSIS — R109 Unspecified abdominal pain: Secondary | ICD-10-CM | POA: Diagnosis not present

## 2013-05-07 DIAGNOSIS — E860 Dehydration: Secondary | ICD-10-CM | POA: Diagnosis not present

## 2013-05-08 ENCOUNTER — Ambulatory Visit (INDEPENDENT_AMBULATORY_CARE_PROVIDER_SITE_OTHER): Payer: Medicare Other | Admitting: Family Medicine

## 2013-05-08 ENCOUNTER — Encounter: Payer: Self-pay | Admitting: Family Medicine

## 2013-05-08 VITALS — BP 138/82 | HR 74 | Temp 98.2°F | Wt 187.0 lb

## 2013-05-08 DIAGNOSIS — E785 Hyperlipidemia, unspecified: Secondary | ICD-10-CM | POA: Diagnosis not present

## 2013-05-08 DIAGNOSIS — R232 Flushing: Secondary | ICD-10-CM | POA: Diagnosis not present

## 2013-05-08 DIAGNOSIS — R221 Localized swelling, mass and lump, neck: Secondary | ICD-10-CM

## 2013-05-08 DIAGNOSIS — K7689 Other specified diseases of liver: Secondary | ICD-10-CM | POA: Diagnosis not present

## 2013-05-08 DIAGNOSIS — R0609 Other forms of dyspnea: Secondary | ICD-10-CM | POA: Diagnosis not present

## 2013-05-08 DIAGNOSIS — Z87891 Personal history of nicotine dependence: Secondary | ICD-10-CM | POA: Diagnosis not present

## 2013-05-08 DIAGNOSIS — K219 Gastro-esophageal reflux disease without esophagitis: Secondary | ICD-10-CM | POA: Diagnosis not present

## 2013-05-08 DIAGNOSIS — Z885 Allergy status to narcotic agent status: Secondary | ICD-10-CM | POA: Diagnosis not present

## 2013-05-08 DIAGNOSIS — K8020 Calculus of gallbladder without cholecystitis without obstruction: Secondary | ICD-10-CM | POA: Diagnosis not present

## 2013-05-08 DIAGNOSIS — G40909 Epilepsy, unspecified, not intractable, without status epilepticus: Secondary | ICD-10-CM | POA: Diagnosis not present

## 2013-05-08 DIAGNOSIS — Z91011 Allergy to milk products: Secondary | ICD-10-CM

## 2013-05-08 DIAGNOSIS — F411 Generalized anxiety disorder: Secondary | ICD-10-CM | POA: Diagnosis not present

## 2013-05-08 DIAGNOSIS — G43909 Migraine, unspecified, not intractable, without status migrainosus: Secondary | ICD-10-CM | POA: Diagnosis not present

## 2013-05-08 DIAGNOSIS — Z888 Allergy status to other drugs, medicaments and biological substances status: Secondary | ICD-10-CM | POA: Diagnosis not present

## 2013-05-08 DIAGNOSIS — Z79899 Other long term (current) drug therapy: Secondary | ICD-10-CM | POA: Diagnosis not present

## 2013-05-08 DIAGNOSIS — J3489 Other specified disorders of nose and nasal sinuses: Secondary | ICD-10-CM | POA: Diagnosis not present

## 2013-05-08 DIAGNOSIS — R0981 Nasal congestion: Secondary | ICD-10-CM

## 2013-05-08 DIAGNOSIS — I471 Supraventricular tachycardia: Secondary | ICD-10-CM | POA: Diagnosis not present

## 2013-05-08 DIAGNOSIS — Z883 Allergy status to other anti-infective agents status: Secondary | ICD-10-CM | POA: Diagnosis not present

## 2013-05-08 DIAGNOSIS — R0789 Other chest pain: Secondary | ICD-10-CM | POA: Diagnosis not present

## 2013-05-08 DIAGNOSIS — I10 Essential (primary) hypertension: Secondary | ICD-10-CM | POA: Diagnosis not present

## 2013-05-08 DIAGNOSIS — Z882 Allergy status to sulfonamides status: Secondary | ICD-10-CM | POA: Diagnosis not present

## 2013-05-08 DIAGNOSIS — R220 Localized swelling, mass and lump, head: Secondary | ICD-10-CM

## 2013-05-08 DIAGNOSIS — T7800XA Anaphylactic reaction due to unspecified food, initial encounter: Secondary | ICD-10-CM

## 2013-05-08 DIAGNOSIS — Z8542 Personal history of malignant neoplasm of other parts of uterus: Secondary | ICD-10-CM | POA: Diagnosis not present

## 2013-05-08 DIAGNOSIS — Z886 Allergy status to analgesic agent status: Secondary | ICD-10-CM | POA: Diagnosis not present

## 2013-05-08 DIAGNOSIS — R1011 Right upper quadrant pain: Secondary | ICD-10-CM | POA: Diagnosis not present

## 2013-05-08 DIAGNOSIS — E860 Dehydration: Secondary | ICD-10-CM | POA: Diagnosis not present

## 2013-05-08 DIAGNOSIS — J328 Other chronic sinusitis: Secondary | ICD-10-CM | POA: Diagnosis not present

## 2013-05-08 DIAGNOSIS — D509 Iron deficiency anemia, unspecified: Secondary | ICD-10-CM | POA: Diagnosis not present

## 2013-05-08 DIAGNOSIS — R109 Unspecified abdominal pain: Secondary | ICD-10-CM | POA: Diagnosis not present

## 2013-05-08 LAB — CBC WITH DIFFERENTIAL/PLATELET
Basophils Absolute: 0 10*3/uL (ref 0.0–0.1)
Basophils Relative: 0 % (ref 0–1)
Eosinophils Absolute: 0.2 10*3/uL (ref 0.0–0.7)
Eosinophils Relative: 3 % (ref 0–5)
HCT: 41 % (ref 36.0–46.0)
Hemoglobin: 14 g/dL (ref 12.0–15.0)
Lymphocytes Relative: 25 % (ref 12–46)
Lymphs Abs: 1.5 10*3/uL (ref 0.7–4.0)
MCH: 29.2 pg (ref 26.0–34.0)
MCHC: 34.1 g/dL (ref 30.0–36.0)
MCV: 85.4 fL (ref 78.0–100.0)
Monocytes Absolute: 0.4 10*3/uL (ref 0.1–1.0)
Monocytes Relative: 7 % (ref 3–12)
Neutro Abs: 3.7 10*3/uL (ref 1.7–7.7)
Neutrophils Relative %: 65 % (ref 43–77)
Platelets: 241 10*3/uL (ref 150–400)
RBC: 4.8 MIL/uL (ref 3.87–5.11)
RDW: 14.2 % (ref 11.5–15.5)
WBC: 5.8 10*3/uL (ref 4.0–10.5)

## 2013-05-08 NOTE — Patient Instructions (Signed)
Try to use the carafate.

## 2013-05-08 NOTE — Progress Notes (Signed)
Subjective:    Patient ID: Jennifer [name], female    DOB: 1966/01/13, 47 y.o.   MRN: [id number]  HPI Nasal congestion - Went back to ENT on Tuesday and did another nose culture and looked at her throat and said has some swelling and some webbing on the posterior vocal cords that they felt was consistent with irritation from reflux..Still awaiting culture nasal results.  Hasn't been able to eat and drink again. Has been sick on stomach.  She just says overall she hasn't felt well the last few days. In fact she went to [facility] yesterday for IV fluid. Today, Kept belching all morning.  Has been sipping gingerale. Says even water bothers her stomach and feels like she can't get it down. Has been feeling run down, sick and HA.  issed her BP pill yesterday bc didn't feel well.  Mylanta doesn't help. She said Maalox used to work but was taken off the market. Has been more bloating.  Didn't take zantac the last few days.  Flushing int he face today and shallow breathing. She did skip her blood pressure pill yesterday and only take half of the tabs today. She feels like she gets less heart flutters when she takes a half a tab.  She brought a list of foods that she would like to be tested for to see if she is allergic to them. Review of Systems     Objective:   Physical Exam  Constitutional: She is oriented to person, place, and time. She appears well-developed and well-nourished.  HENT:  Head: Normocephalic and atraumatic.  Right Ear: External ear normal.  Left Ear: External ear normal.  Nose: Nose normal.  Mouth/Throat: Oropharynx is clear and moist.  TMs and canals are clear.   Eyes: Conjunctivae and EOM are normal. Pupils are equal, round, and reactive to light.  Neck: Neck supple. No thyromegaly present.  Cardiovascular: Normal rate, regular rhythm and normal heart sounds.   Pulmonary/Chest: Effort normal and breath sounds normal. She has no wheezes.  Lymphadenopathy:    She has no  cervical adenopathy.  Neurological: She is alert and oriented to person, place, and time.  Skin: Skin is warm and dry.  Psychiatric: She has a normal mood and affect.          Assessment & Plan:  GERD, severe - discussed HAS to been on a PPI or H2 blocker. I explained her that with mechanical reflux she still needed control acid are all caused damage such as Barrett's esophagus at that time. She said she was told she had Barrett's esophagus at one time but on her last endoscopy it showed inflammation. It sounds like she started getting some webbing on the posterior vocal cords also related to her reflux. Again shows to her that she needs to take either H. walker or a PPI. If PPIs cause chest pain and recommended she stick with an H2 blocker. She did not try the Carafate that I gave her. Explained again that she can at least try it and see if it cuts her stomach and provide some discomfort and relief for her. Evidently she has been evaluated in the past for possible surgical treatment of her reflux, I am assuming a Nissen fundoplication, but was not a good candidate. Also recommend that she not use it a to help soothe her stomach. In fact the carbonated beverages are worse.  Atypical chest pain. Tried to reassure her that this sounds very much related to her reflux.  Does not sound cardiac in nature but I did go ahead and get an EKG on her today which was normal. We will also get a stat CBC to see if she is coming down with some type of early infection since she has felt fatigued and is felt sick on her stomach as well.   EKG shows rate of 75 beats per minute, normal sinus rhythm with normal axis. No acute ST-T wave changes.  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 She did report this to her allergist and they added some more specific testing for the breakdown products for milk. She brought me a list of other foods that she thinks could  potentially be triggering her sensation of feeling like she can't breathe in her throat and upper chest. She would like to be tested for these as well.  Time spent approximately 45 minutes, greater than 50% of the time spent counseling about treatment for GERD, atypical chest pain, and allergies 2 possible foods.

## 2013-05-09 ENCOUNTER — Ambulatory Visit: Payer: Medicare Other | Admitting: Family Medicine

## 2013-05-09 ENCOUNTER — Encounter: Payer: Self-pay | Admitting: Family Medicine

## 2013-05-13 ENCOUNTER — Other Ambulatory Visit: Payer: Self-pay | Admitting: Family Medicine

## 2013-05-13 DIAGNOSIS — R221 Localized swelling, mass and lump, neck: Secondary | ICD-10-CM | POA: Diagnosis not present

## 2013-05-13 DIAGNOSIS — R220 Localized swelling, mass and lump, head: Secondary | ICD-10-CM | POA: Diagnosis not present

## 2013-05-13 DIAGNOSIS — R110 Nausea: Secondary | ICD-10-CM | POA: Diagnosis not present

## 2013-05-13 DIAGNOSIS — M549 Dorsalgia, unspecified: Secondary | ICD-10-CM | POA: Diagnosis not present

## 2013-05-13 DIAGNOSIS — T7800XA Anaphylactic reaction due to unspecified food, initial encounter: Secondary | ICD-10-CM | POA: Diagnosis not present

## 2013-05-14 ENCOUNTER — Encounter: Payer: Self-pay | Admitting: Sports Medicine

## 2013-05-14 ENCOUNTER — Ambulatory Visit (INDEPENDENT_AMBULATORY_CARE_PROVIDER_SITE_OTHER): Payer: Medicare Other | Admitting: Sports Medicine

## 2013-05-14 VITALS — BP 141/87 | HR 103 | Wt 188.0 lb

## 2013-05-14 DIAGNOSIS — M545 Low back pain, unspecified: Secondary | ICD-10-CM

## 2013-05-14 LAB — ALLERGY FULL PROFILE
Allergen, D pternoyssinus,d7: <0.1 kU/L
Allergen,Goose feathers, e70: <0.1 kU/L
Alternaria Alternata: <0.1 kU/L
Aspergillus fumigatus, m3: <0.1 kU/L
Bahia Grass: <0.1 kU/L
Bermuda Grass: <0.1 kU/L
Box Elder IgE: <0.1 kU/L
Candida Albicans: <0.1 kU/L
Cat Dander: <0.1 kU/L
Common Ragweed: <0.1 kU/L
Curvularia lunata: <0.1 kU/L
D. farinae: <0.1 kU/L
Dog Dander: <0.1 kU/L
Elm IgE: <0.1 kU/L
Fescue: <0.1 kU/L
G005 Rye, Perennial: <0.1 kU/L
G009 Red Top: <0.1 kU/L
Goldenrod: <0.1 kU/L
Helminthosporium halodes: <0.1 kU/L
House Dust Hollister: <0.1 kU/L
IgE (Immunoglobulin E), Serum: 7 IU/mL (ref 0.0–180.0)
Lamb's Quarters: <0.1 kU/L
Oak: <0.1 kU/L
Plantain: <0.1 kU/L
Stemphylium Botryosum: <0.1 kU/L
Sycamore Tree: <0.1 kU/L
Timothy Grass: <0.1 kU/L

## 2013-05-14 LAB — ALLERGY PROFILE REGION II-DC, DE, MD, ~~LOC~~, VA
Allergen, D pternoyssinus,d7: <0.1 kU/L
Alternaria Alternata: <0.1 kU/L
Aspergillus fumigatus, m3: <0.1 kU/L
Bermuda Grass: <0.1 kU/L
Box Elder IgE: <0.1 kU/L
Cat Dander: <0.1 kU/L
Cladosporium Herbarum: <0.1 kU/L
Cockroach: <0.1 kU/L
Common Ragweed: <0.1 kU/L
D. farinae: <0.1 kU/L
Dog Dander: <0.1 kU/L
Elm IgE: <0.1 kU/L
Johnson Grass: <0.1 kU/L
Lamb's Quarters: <0.1 kU/L
Meadow Grass: <0.1 kU/L
Oak: <0.1 kU/L
Pecan/Hickory Tree IgE: <0.1 kU/L

## 2013-05-14 LAB — NUT MIX PROFILE
Almonds: <0.1 kU/L
Brazil Nut: <0.1 kU/L
Cashew IgE: <0.1 kU/L
Hazelnut: <0.1 kU/L
Peanut IgE: <0.1 kU/L
Pecan Nut: <0.1 kU/L
Pistachio  IgE: <0.1 kU/L
Walnut: <0.1 kU/L

## 2013-05-14 LAB — ALLERGEN TILAPIA F414: Allergen Tilapia f414: <0.1 kU/L

## 2013-05-14 IMAGING — CT CT HEAD W/O CM
1 series · 16 of 30 positions shown, 20 images · non-contrast
Comparison: 10/07/2011

CLINICAL DATA: Headache

CT HEAD WITHOUT CONTRAST
TECHNIQUE: Contiguous axial images were obtained from the base of
the skull through the vertex without contrast.

[Series 2: head 4.8 h37s · axial · 0.41mm/px · z∈[-78,+82]mm · 16 of 36 slices shown, 20 images]
[im 2/36  brain]
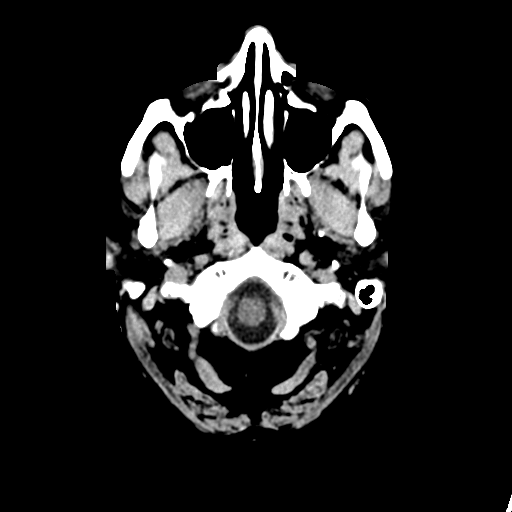
[im 2/36  bone]
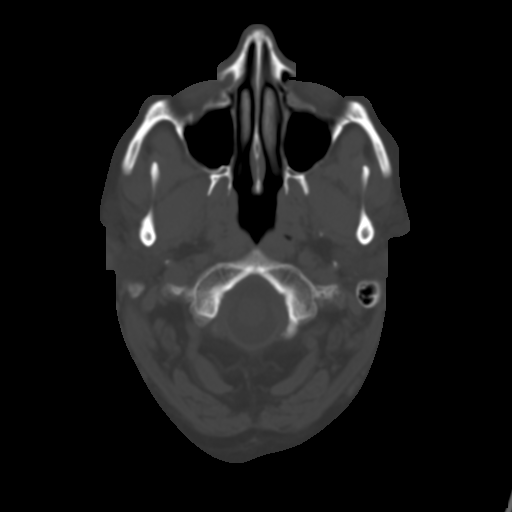
[im 4/36  brain]
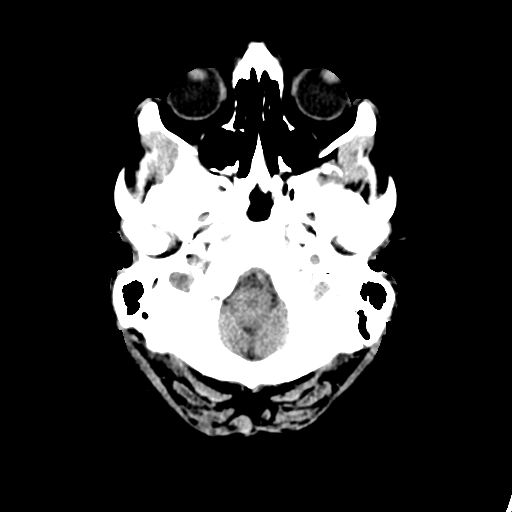
[im 7/36  brain]
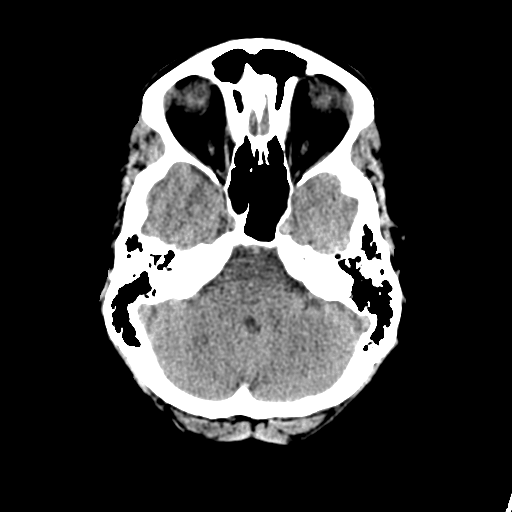
[im 9/36  brain]
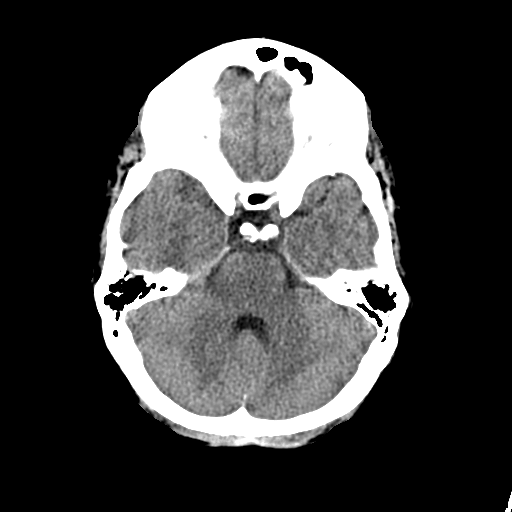
[im 10/36  brain]
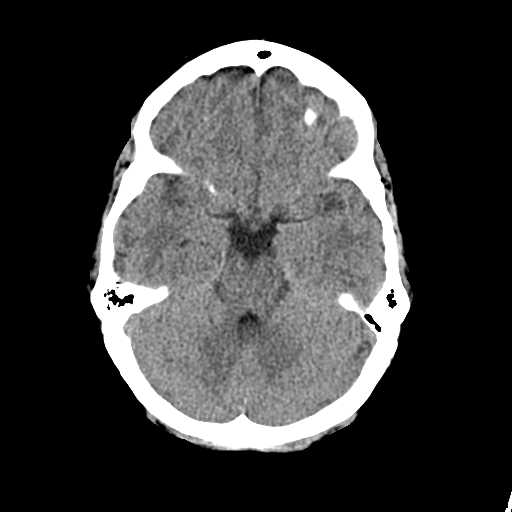
[im 10/36  bone]
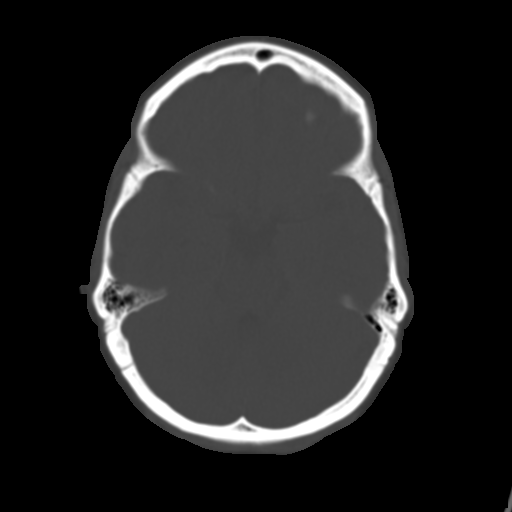
[im 13/36  brain]
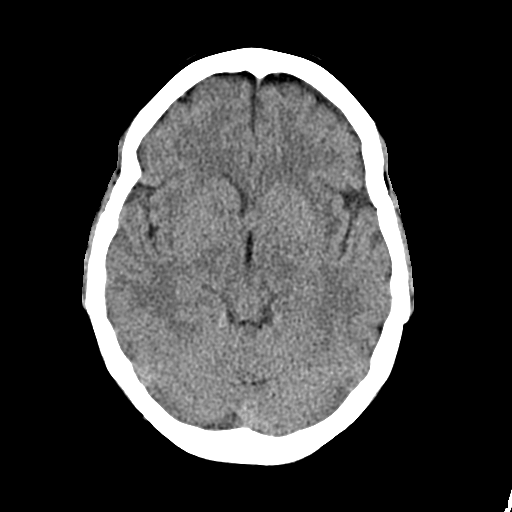
[im 15/36  brain]
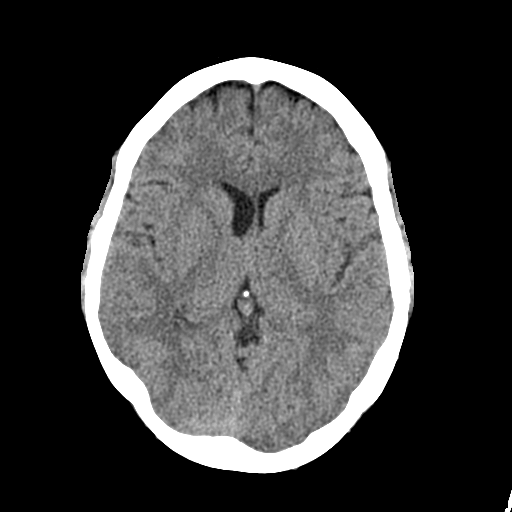
[im 17/36  brain]
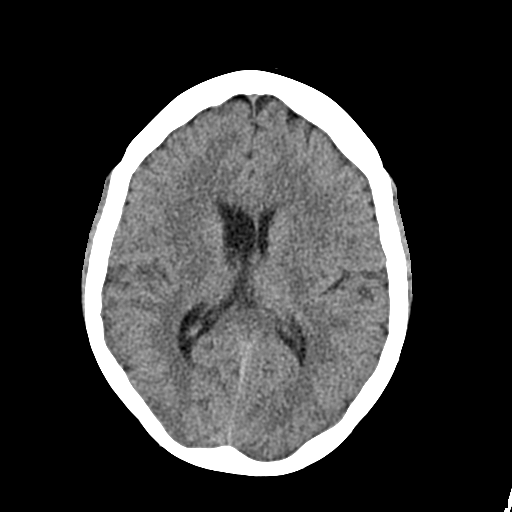
[im 19/36  brain]
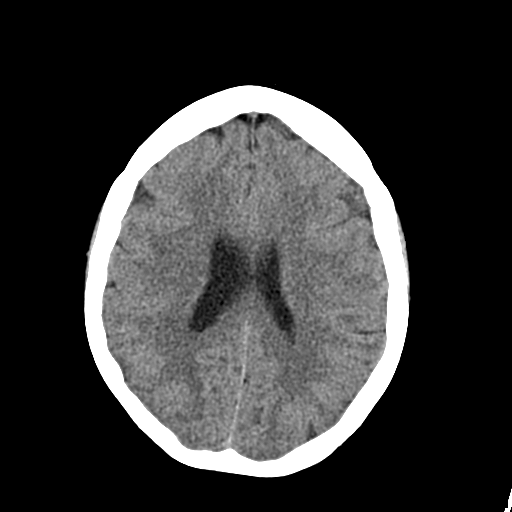
[im 19/36  bone]
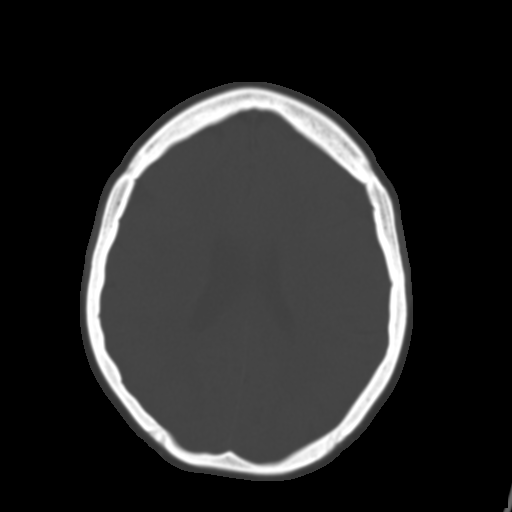
[im 21/36  brain]
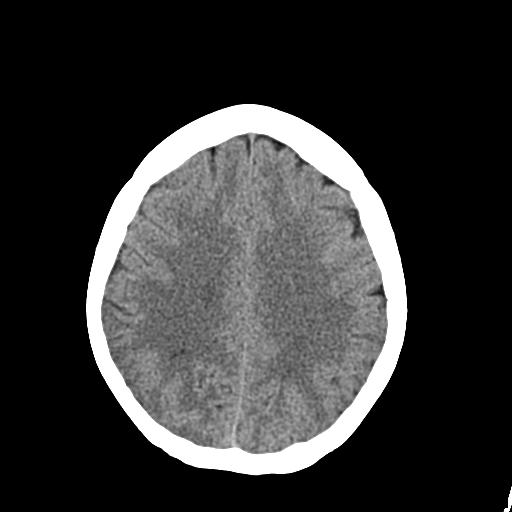
[im 23/36  brain]
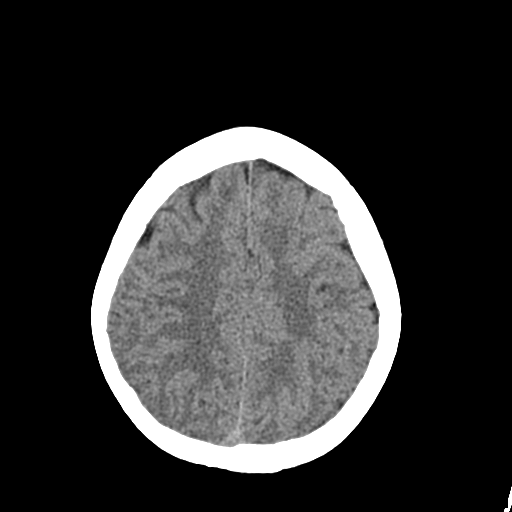
[im 26/36  brain]
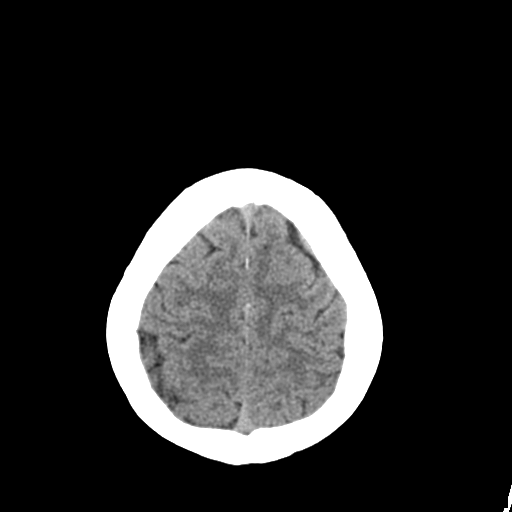
[im 27/36  brain]
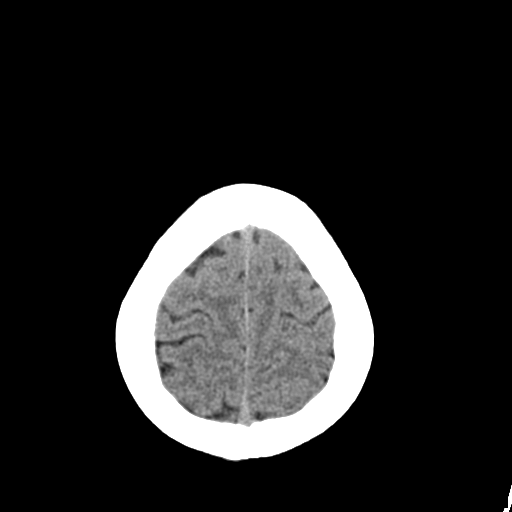
[im 27/36  bone]
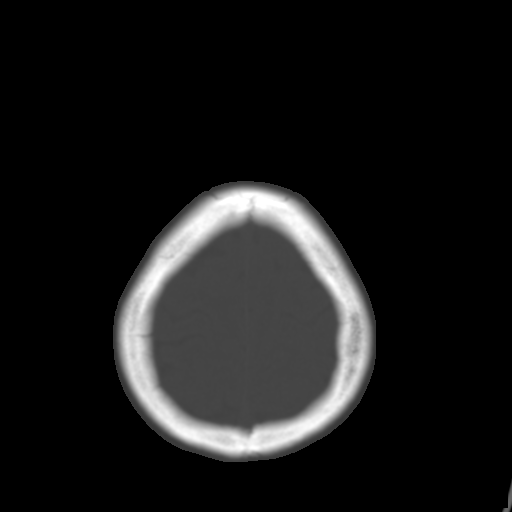
[im 29/36  brain]
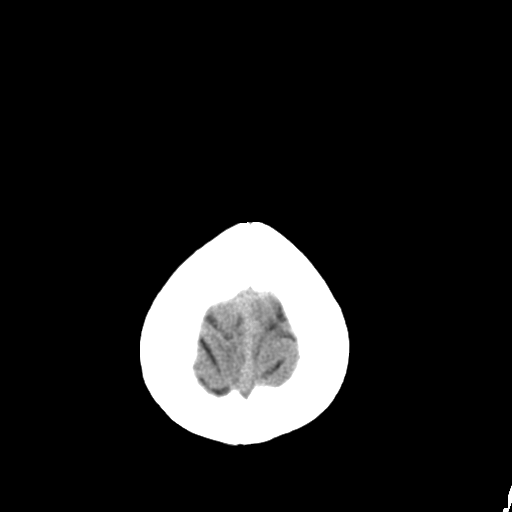
[im 32/36  brain]
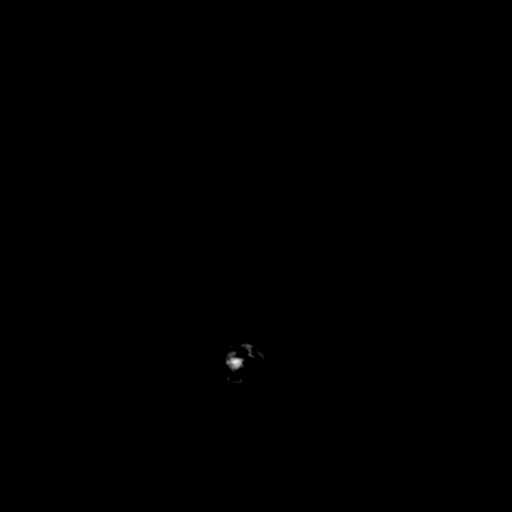
[im 34/36  brain]
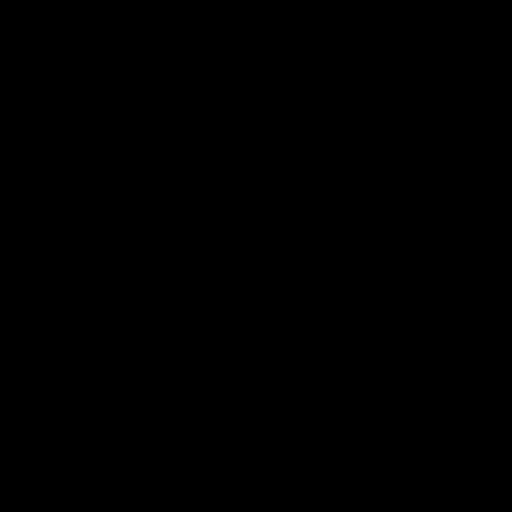

[16 of 30 positions shown; findings below may reference images not displayed]

FINDINGS: No acute intracranial hemorrhage, mass lesion,
infarction, midline shift, herniation, hydrocephalus.  Stable
slight lateral ventricular asymmetry as before.  Gray-white matter
differentiation maintained.  Cisterns patent.  No cerebellar
abnormality.  Orbits symmetric.  Mastoids and sinuses clear.  No
visualized skull abnormality.
IMPRESSION: Stable exam.  No acute intracranial process

## 2013-05-14 MED ORDER — GABAPENTIN 300 MG PO CAPS: ORAL_CAPSULE | ORAL | Status: DC

## 2013-05-14 MED ORDER — ACETAMINOPHEN ER 650 MG PO TBCR: 1300.0000 mg | EXTENDED_RELEASE_TABLET | Freq: Three times a day (TID) | ORAL | Status: DC | PRN

## 2013-05-14 NOTE — Progress Notes (Signed)
  Subjective:    CC: Low back pain  HPI: Jennifer [name] is a 47 year old female, she has a significant anxiety, and a history of psychosis with steroids. I seen her for multiple problems in the past, more recently calcific tendinitis of her rotator cuff, we have been limited with treatment as I cannot use steroids in the injection. She comes in today with a long history of low back pain, localized, worse with sitting for long periods of time, worse with flexion, worse with Valsalva, but no radicular component. She has had multiple imaging studies for multiple different facilities, the most recent of which included a CT scan of the lumbar spine which will be dictated below. Of note, she does have a history of endometrial cancer, and is currently being seen by a gynecologic oncologist. She did also have a thoracic spine MRI which interestingly showed what may have been a hemangioma versus metastatic disease in the T4 vertebral body. Pain is moderate, persistent. We are very limited in what medications we can use due to her extensive allergy list of 29 medications.  Past medical history, Surgical history, Family history not pertinant except as noted below, Social history, Allergies, and medications have been entered into the medical record, reviewed, and no changes needed.   Review of Systems: No fevers, chills, night sweats, weight loss, chest pain, or shortness of breath.   Objective:    General: Well Developed, well nourished, and in no acute distress.  Neuro: Alert and oriented x3, extra-ocular muscles intact, sensation grossly intact.  HEENT: Normocephalic, atraumatic, pupils equal round reactive to light, neck supple, no masses, no lymphadenopathy, thyroid nonpalpable.  Skin: Warm and dry, no rashes. Cardiac: Regular rate and rhythm, no murmurs rubs or gallops, no lower extremity edema.  Respiratory: Clear to auscultation bilaterally. Not using accessory muscles, speaking in full sentences. Back Exam:   Inspection: Unremarkable  Motion: Flexion 45 deg, Extension 45 deg, Side Bending to 45 deg bilaterally,  Rotation to 45 deg bilaterally  SLR laying: Negative  XSLR laying: Negative  Palpable tenderness: None. FABER: negative. Sensory change: Gross sensation intact to all lumbar and sacral dermatomes.  Reflexes: 2+ at both patellar tendons, 2+ at achilles tendons, Babinski's downgoing.  Strength at foot  Plantar-flexion: 5/5 Dorsi-flexion: 5/5 Eversion: 5/5 Inversion: 5/5  Leg strength  Quad: 5/5 Hamstring: 5/5 Hip flexor: 5/5 Hip abductors: 5/5  Gait unremarkable.  CT scan shows moderate sized disc protrusions at the L3-L4, L4-L5, and L5-S1 levels. I only have the radiologist's report and no images.  MRI of the thoracic spine from 2011 shows an abnormality in the T4 vertebral body suggestive of hemangioma versus metastatic disease.  She also brings with her pathology report showing endometrial adenocarcinoma.  Impression and Recommendations:    I spent 40 minutes with this patient, greater than 50% was face-to-face time counseling regarding endometrial adenocarcinoma and causes of low back pain.

## 2013-05-14 NOTE — Assessment & Plan Note (Addendum)
Persistent low back pain with a CT scan showing L3-L4, L4-L5, and L5-S1 small disc protrusions which are likely the cause of her pain. She is intolerant to approximately 29 medications, including steroids, narcotics, most NSAIDs. She will use Tylenol 650 mg 2 tabs twice a day, we will start gabapentin. On further review of thoracic spine MRI from 2011, she did have a lesion that appeared to be a hemangioma in the T4 vertebral body. This has not been further evaluated per patient history, and she does have a history of endometrial cancer, certainly a workup for metastatic disease is necessary. I will start her on gabapentin but I have been very clear with her that she needs to followup with her oncologist regarding further evaluation of the T4 vertebral body lesion. She agrees to see her oncologist regarding this. She can return to see me in one month or as needed.

## 2013-05-15 DIAGNOSIS — F339 Major depressive disorder, recurrent, unspecified: Secondary | ICD-10-CM | POA: Diagnosis not present

## 2013-05-15 LAB — ALLERGEN BARLEY F6: Allergen Barley f6: <0.1 kU/L

## 2013-05-15 LAB — ALLERGEN CARROT: Carrot: <0.1 kU/L

## 2013-05-15 LAB — ALLERGEN CHOCOLATE: Allergen Chocolate f93: <0.1 kU/L

## 2013-05-15 LAB — ALLERGEN PEA F12: Allergen Pea f12: <0.1 kU/L

## 2013-05-16 ENCOUNTER — Telehealth: Payer: Self-pay | Admitting: Family Medicine

## 2013-05-16 ENCOUNTER — Emergency Department (HOSPITAL_BASED_OUTPATIENT_CLINIC_OR_DEPARTMENT_OTHER)
Admission: EM | Admit: 2013-05-16 | Discharge: 2013-05-16 | Disposition: A | Discharge status: Home / Self Care | Payer: Medicare Other | Attending: Emergency Medicine | Admitting: Emergency Medicine

## 2013-05-16 ENCOUNTER — Encounter (HOSPITAL_BASED_OUTPATIENT_CLINIC_OR_DEPARTMENT_OTHER): Payer: Self-pay

## 2013-05-16 ENCOUNTER — Emergency Department (HOSPITAL_BASED_OUTPATIENT_CLINIC_OR_DEPARTMENT_OTHER): Payer: Medicare Other

## 2013-05-16 DIAGNOSIS — G8929 Other chronic pain: Secondary | ICD-10-CM | POA: Diagnosis not present

## 2013-05-16 DIAGNOSIS — E269 Hyperaldosteronism, unspecified: Secondary | ICD-10-CM | POA: Diagnosis not present

## 2013-05-16 DIAGNOSIS — J45901 Unspecified asthma with (acute) exacerbation: Secondary | ICD-10-CM | POA: Diagnosis not present

## 2013-05-16 DIAGNOSIS — Z22322 Carrier or suspected carrier of Methicillin resistant Staphylococcus aureus: Secondary | ICD-10-CM | POA: Diagnosis not present

## 2013-05-16 DIAGNOSIS — D72819 Decreased white blood cell count, unspecified: Secondary | ICD-10-CM | POA: Diagnosis not present

## 2013-05-16 DIAGNOSIS — E785 Hyperlipidemia, unspecified: Secondary | ICD-10-CM | POA: Diagnosis not present

## 2013-05-16 DIAGNOSIS — D509 Iron deficiency anemia, unspecified: Secondary | ICD-10-CM | POA: Diagnosis not present

## 2013-05-16 DIAGNOSIS — J40 Bronchitis, not specified as acute or chronic: Secondary | ICD-10-CM

## 2013-05-16 DIAGNOSIS — Z8542 Personal history of malignant neoplasm of other parts of uterus: Secondary | ICD-10-CM | POA: Insufficient documentation

## 2013-05-16 DIAGNOSIS — G4733 Obstructive sleep apnea (adult) (pediatric): Secondary | ICD-10-CM | POA: Diagnosis not present

## 2013-05-16 DIAGNOSIS — J45909 Unspecified asthma, uncomplicated: Secondary | ICD-10-CM | POA: Diagnosis not present

## 2013-05-16 DIAGNOSIS — G4730 Sleep apnea, unspecified: Secondary | ICD-10-CM | POA: Diagnosis not present

## 2013-05-16 DIAGNOSIS — Z862 Personal history of diseases of the blood and blood-forming organs and certain disorders involving the immune mechanism: Secondary | ICD-10-CM | POA: Diagnosis not present

## 2013-05-16 DIAGNOSIS — Z79899 Other long term (current) drug therapy: Secondary | ICD-10-CM | POA: Diagnosis not present

## 2013-05-16 DIAGNOSIS — M549 Dorsalgia, unspecified: Secondary | ICD-10-CM | POA: Insufficient documentation

## 2013-05-16 DIAGNOSIS — K219 Gastro-esophageal reflux disease without esophagitis: Secondary | ICD-10-CM | POA: Diagnosis not present

## 2013-05-16 DIAGNOSIS — R059 Cough, unspecified: Secondary | ICD-10-CM | POA: Diagnosis not present

## 2013-05-16 DIAGNOSIS — I10 Essential (primary) hypertension: Secondary | ICD-10-CM | POA: Insufficient documentation

## 2013-05-16 DIAGNOSIS — R05 Cough: Secondary | ICD-10-CM | POA: Diagnosis not present

## 2013-05-16 DIAGNOSIS — Z880 Allergy status to penicillin: Secondary | ICD-10-CM | POA: Insufficient documentation

## 2013-05-16 DIAGNOSIS — Z87891 Personal history of nicotine dependence: Secondary | ICD-10-CM | POA: Insufficient documentation

## 2013-05-16 DIAGNOSIS — J209 Acute bronchitis, unspecified: Secondary | ICD-10-CM | POA: Diagnosis not present

## 2013-05-16 DIAGNOSIS — IMO0002 Reserved for concepts with insufficient information to code with codable children: Secondary | ICD-10-CM | POA: Diagnosis not present

## 2013-05-16 DIAGNOSIS — R1310 Dysphagia, unspecified: Secondary | ICD-10-CM | POA: Diagnosis not present

## 2013-05-16 DIAGNOSIS — R072 Precordial pain: Secondary | ICD-10-CM | POA: Diagnosis not present

## 2013-05-16 LAB — BASIC METABOLIC PANEL
BUN: 14 mg/dL (ref 6–23)
CO2: 24 mEq/L (ref 19–32)
Calcium: 9.4 mg/dL (ref 8.4–10.5)
Chloride: 104 mEq/L (ref 96–112)
Creatinine, Ser: 0.5 mg/dL (ref 0.50–1.10)
GFR calc Af Amer: >90 mL/min (ref >90)
GFR calc non Af Amer: >90 mL/min (ref >90)
Glucose, Bld: 107 mg/dL — ABNORMAL HIGH (ref 70–99)
Potassium: 3.7 mEq/L (ref 3.5–5.1)
Sodium: 139 mEq/L (ref 135–145)

## 2013-05-16 LAB — CBC
HCT: 38.9 % (ref 36.0–46.0)
Hemoglobin: 13 g/dL (ref 12.0–15.0)
MCH: 29.7 pg (ref 26.0–34.0)
MCHC: 33.4 g/dL (ref 30.0–36.0)
MCV: 89 fL (ref 78.0–100.0)
Platelets: 200 10*3/uL (ref 150–400)
RBC: 4.37 MIL/uL (ref 3.87–5.11)
RDW: 13.4 % (ref 11.5–15.5)
WBC: 6.2 10*3/uL (ref 4.0–10.5)

## 2013-05-16 LAB — TROPONIN I: Troponin I: <0.3 ng/mL (ref <0.30)

## 2013-05-16 LAB — D-DIMER, QUANTITATIVE (NOT AT ARMC): D-Dimer, Quant: <0.27 ug/mL-FEU (ref 0.00–0.48)

## 2013-05-16 NOTE — ED Notes (Signed)
Pt reports intermittent low back pain for "a while" and developed chest wall tightness radiating to shoulder blades last night.  Stats the discomfort seems worse with a deep breath.
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
## 2013-05-16 NOTE — Telephone Encounter (Signed)
Patient walk-in request to get lab results from Tuesday 05/13/13 and adv that final results not in yet. Patient request to know if you can call her asap when the results come in. Thank you

## 2013-05-19 LAB — ALLERGEN GUM CARAGEENAN
Allergen Gum Carageenan: <0.1 kU/L (ref <0.35)
Class: 0

## 2013-05-20 ENCOUNTER — Encounter: Payer: Self-pay | Admitting: Family Medicine

## 2013-05-20 ENCOUNTER — Ambulatory Visit (INDEPENDENT_AMBULATORY_CARE_PROVIDER_SITE_OTHER): Payer: Medicare Other | Admitting: Family Medicine

## 2013-05-20 VITALS — BP 147/86 | HR 103 | Wt 189.0 lb

## 2013-05-20 DIAGNOSIS — R0981 Nasal congestion: Secondary | ICD-10-CM

## 2013-05-20 DIAGNOSIS — I10 Essential (primary) hypertension: Secondary | ICD-10-CM | POA: Diagnosis not present

## 2013-05-20 DIAGNOSIS — J3489 Other specified disorders of nose and nasal sinuses: Secondary | ICD-10-CM | POA: Diagnosis not present

## 2013-05-20 DIAGNOSIS — Z882 Allergy status to sulfonamides status: Secondary | ICD-10-CM | POA: Diagnosis not present

## 2013-05-20 DIAGNOSIS — Z881 Allergy status to other antibiotic agents status: Secondary | ICD-10-CM | POA: Diagnosis not present

## 2013-05-20 DIAGNOSIS — B353 Tinea pedis: Secondary | ICD-10-CM | POA: Diagnosis not present

## 2013-05-20 DIAGNOSIS — R42 Dizziness and giddiness: Secondary | ICD-10-CM | POA: Diagnosis not present

## 2013-05-20 DIAGNOSIS — E269 Hyperaldosteronism, unspecified: Secondary | ICD-10-CM | POA: Diagnosis not present

## 2013-05-20 DIAGNOSIS — Z888 Allergy status to other drugs, medicaments and biological substances status: Secondary | ICD-10-CM | POA: Diagnosis not present

## 2013-05-20 DIAGNOSIS — G8929 Other chronic pain: Secondary | ICD-10-CM | POA: Diagnosis not present

## 2013-05-20 DIAGNOSIS — F411 Generalized anxiety disorder: Secondary | ICD-10-CM | POA: Diagnosis not present

## 2013-05-20 DIAGNOSIS — Z87891 Personal history of nicotine dependence: Secondary | ICD-10-CM | POA: Diagnosis not present

## 2013-05-20 DIAGNOSIS — Z79899 Other long term (current) drug therapy: Secondary | ICD-10-CM | POA: Diagnosis not present

## 2013-05-20 DIAGNOSIS — Z885 Allergy status to narcotic agent status: Secondary | ICD-10-CM | POA: Diagnosis not present

## 2013-05-20 DIAGNOSIS — I471 Supraventricular tachycardia: Secondary | ICD-10-CM | POA: Diagnosis not present

## 2013-05-20 DIAGNOSIS — R51 Headache: Secondary | ICD-10-CM | POA: Diagnosis not present

## 2013-05-20 DIAGNOSIS — Z880 Allergy status to penicillin: Secondary | ICD-10-CM | POA: Diagnosis not present

## 2013-05-20 DIAGNOSIS — Z886 Allergy status to analgesic agent status: Secondary | ICD-10-CM | POA: Diagnosis not present

## 2013-05-20 DIAGNOSIS — K219 Gastro-esophageal reflux disease without esophagitis: Secondary | ICD-10-CM | POA: Diagnosis not present

## 2013-05-20 DIAGNOSIS — G40909 Epilepsy, unspecified, not intractable, without status epilepticus: Secondary | ICD-10-CM | POA: Diagnosis not present

## 2013-05-20 DIAGNOSIS — M549 Dorsalgia, unspecified: Secondary | ICD-10-CM

## 2013-05-20 DIAGNOSIS — E785 Hyperlipidemia, unspecified: Secondary | ICD-10-CM | POA: Diagnosis not present

## 2013-05-20 DIAGNOSIS — G43909 Migraine, unspecified, not intractable, without status migrainosus: Secondary | ICD-10-CM | POA: Diagnosis not present

## 2013-05-20 DIAGNOSIS — Z91011 Allergy to milk products: Secondary | ICD-10-CM

## 2013-05-20 DIAGNOSIS — Z883 Allergy status to other anti-infective agents status: Secondary | ICD-10-CM | POA: Diagnosis not present

## 2013-05-20 DIAGNOSIS — J45909 Unspecified asthma, uncomplicated: Secondary | ICD-10-CM | POA: Diagnosis not present

## 2013-05-20 DIAGNOSIS — D509 Iron deficiency anemia, unspecified: Secondary | ICD-10-CM | POA: Diagnosis not present

## 2013-05-20 DIAGNOSIS — M5137 Other intervertebral disc degeneration, lumbosacral region: Secondary | ICD-10-CM | POA: Diagnosis not present

## 2013-05-20 DIAGNOSIS — Z8544 Personal history of malignant neoplasm of other female genital organs: Secondary | ICD-10-CM | POA: Diagnosis not present

## 2013-05-20 MED ORDER — MUPIROCIN 2 % EX OINT: TOPICAL_OINTMENT | CUTANEOUS | Status: DC

## 2013-05-20 MED ORDER — AMLODIPINE BESYLATE 2.5 MG PO TABS: 1.2500 mg | ORAL_TABLET | Freq: Every day | ORAL | Status: DC

## 2013-05-20 NOTE — Telephone Encounter (Signed)
Results given .Jennifer [name]
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
## 2013-05-21 ENCOUNTER — Telehealth: Payer: Self-pay | Admitting: Family Medicine

## 2013-05-21 MED ORDER — CLOTRIMAZOLE-BETAMETHASONE 1-0.05 % EX CREA: 1.0000 "application " | TOPICAL_CREAM | Freq: Two times a day (BID) | CUTANEOUS | Status: DC

## 2013-05-21 NOTE — Telephone Encounter (Signed)
Plesea call pt and see when she has been scheduled for surgery for her hysterectomy

## 2013-05-21 NOTE — Telephone Encounter (Signed)
Pt states cream for foot did not go through.

## 2013-05-22 NOTE — Telephone Encounter (Signed)
Called and pt informed that it is scheduled for 10.28.2014.Loralee Pacas [place]

## 2013-05-23 DIAGNOSIS — R079 Chest pain, unspecified: Secondary | ICD-10-CM | POA: Diagnosis not present

## 2013-05-23 DIAGNOSIS — R0789 Other chest pain: Secondary | ICD-10-CM | POA: Diagnosis not present

## 2013-05-23 DIAGNOSIS — K224 Dyskinesia of esophagus: Secondary | ICD-10-CM | POA: Diagnosis not present

## 2013-05-23 DIAGNOSIS — R1110 Vomiting, unspecified: Secondary | ICD-10-CM | POA: Diagnosis not present

## 2013-05-23 DIAGNOSIS — K219 Gastro-esophageal reflux disease without esophagitis: Secondary | ICD-10-CM | POA: Diagnosis not present

## 2013-05-24 DIAGNOSIS — Z79899 Other long term (current) drug therapy: Secondary | ICD-10-CM | POA: Diagnosis not present

## 2013-05-24 DIAGNOSIS — Z888 Allergy status to other drugs, medicaments and biological substances status: Secondary | ICD-10-CM | POA: Diagnosis not present

## 2013-05-24 DIAGNOSIS — R079 Chest pain, unspecified: Secondary | ICD-10-CM | POA: Diagnosis not present

## 2013-05-24 DIAGNOSIS — K224 Dyskinesia of esophagus: Secondary | ICD-10-CM | POA: Diagnosis not present

## 2013-05-24 DIAGNOSIS — R0789 Other chest pain: Secondary | ICD-10-CM | POA: Diagnosis not present

## 2013-05-24 DIAGNOSIS — K219 Gastro-esophageal reflux disease without esophagitis: Secondary | ICD-10-CM | POA: Diagnosis not present

## 2013-05-24 DIAGNOSIS — Z880 Allergy status to penicillin: Secondary | ICD-10-CM | POA: Diagnosis not present

## 2013-05-24 DIAGNOSIS — Z882 Allergy status to sulfonamides status: Secondary | ICD-10-CM | POA: Diagnosis not present

## 2013-05-26 DIAGNOSIS — F450 Somatization disorder: Secondary | ICD-10-CM | POA: Insufficient documentation

## 2013-05-26 DIAGNOSIS — J392 Other diseases of pharynx: Secondary | ICD-10-CM | POA: Diagnosis not present

## 2013-05-26 DIAGNOSIS — M542 Cervicalgia: Secondary | ICD-10-CM | POA: Diagnosis not present

## 2013-05-26 DIAGNOSIS — J029 Acute pharyngitis, unspecified: Secondary | ICD-10-CM | POA: Diagnosis not present

## 2013-05-26 DIAGNOSIS — K219 Gastro-esophageal reflux disease without esophagitis: Secondary | ICD-10-CM | POA: Diagnosis not present

## 2013-05-26 DIAGNOSIS — F411 Generalized anxiety disorder: Secondary | ICD-10-CM | POA: Diagnosis not present

## 2013-05-26 DIAGNOSIS — R1310 Dysphagia, unspecified: Secondary | ICD-10-CM | POA: Diagnosis not present

## 2013-05-26 DIAGNOSIS — R079 Chest pain, unspecified: Secondary | ICD-10-CM | POA: Diagnosis not present

## 2013-05-28 DIAGNOSIS — R1310 Dysphagia, unspecified: Secondary | ICD-10-CM | POA: Diagnosis not present

## 2013-05-28 DIAGNOSIS — R1110 Vomiting, unspecified: Secondary | ICD-10-CM | POA: Diagnosis not present

## 2013-05-28 IMAGING — US US PELVIS COMPLETE
1 series · 14 of 25 positions shown · non-contrast
Comparison: 08/16/2011

CLINICAL DATA: Pelvic pain.  Fibroids.  Dysuria.

TRANSABDOMINAL AND TRANSVAGINAL ULTRASOUND OF PELVIS
TECHNIQUE: Both transabdominal and transvaginal ultrasound
examinations of the pelvis were performed.  Transabdominal
technique was performed for global imaging of the pelvis including
uterus, ovaries, adnexal regions, and pelvic cul-de-sac.
It was necessary to proceed with endovaginal exam following the
transabdominal exam to visualize the small fibroids and left
ovarian cyst.

[Series 1: us pelvis complete · 14 of 64 slices shown]
[im 1/64]
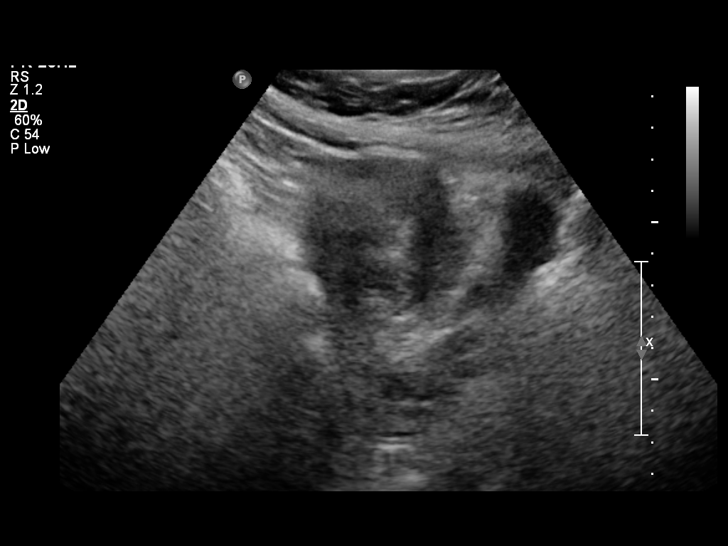
[im 6/64]
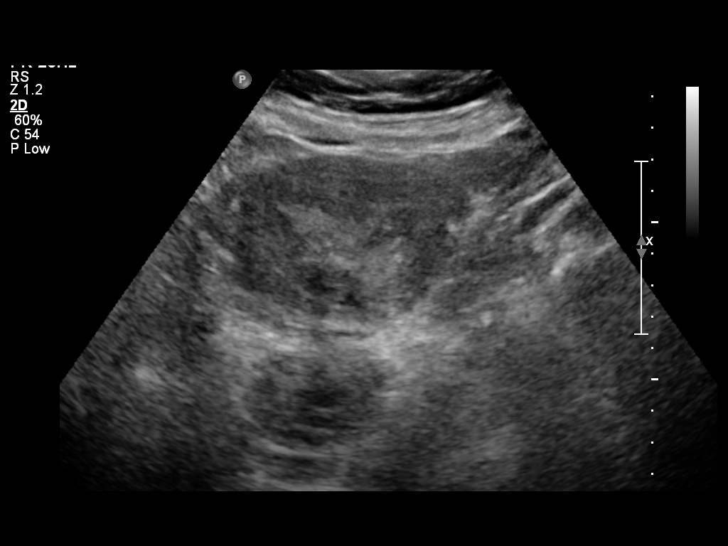
[im 11/64]
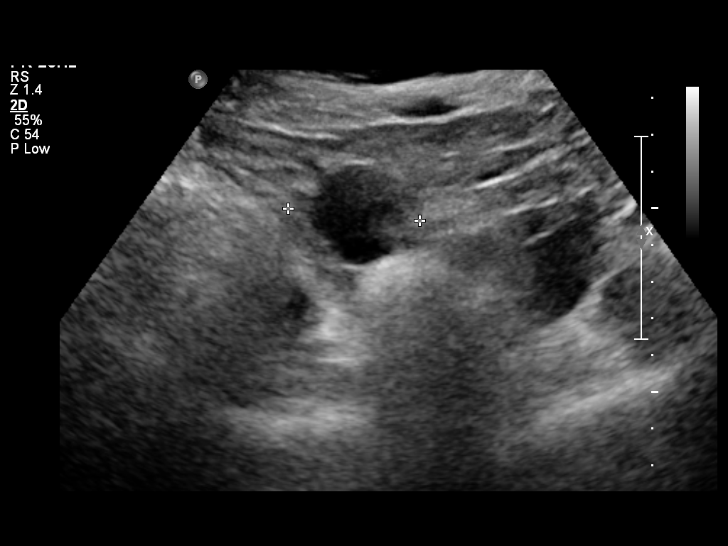
[im 16/64]
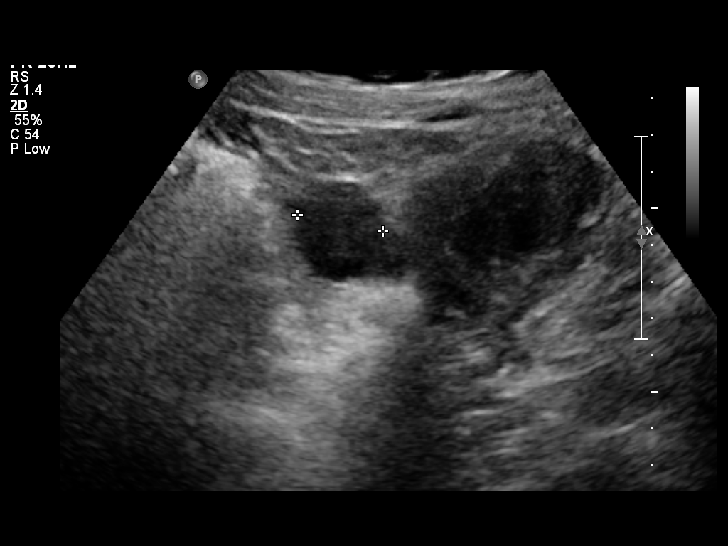
[im 22/64]
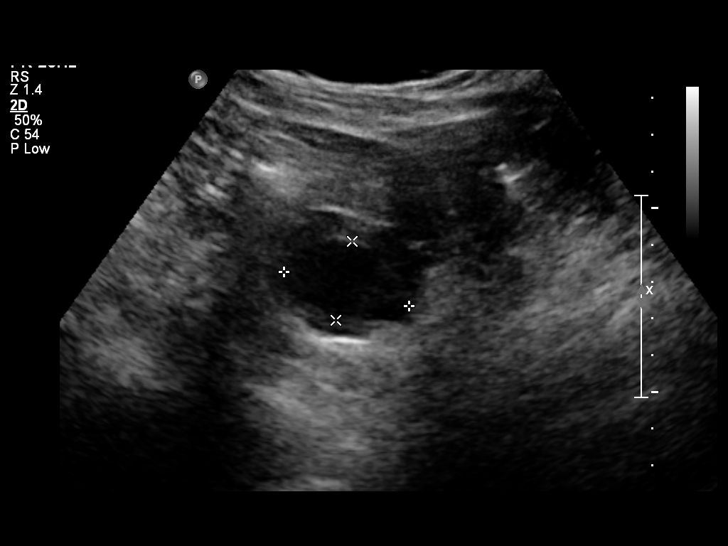
[im 24/64]
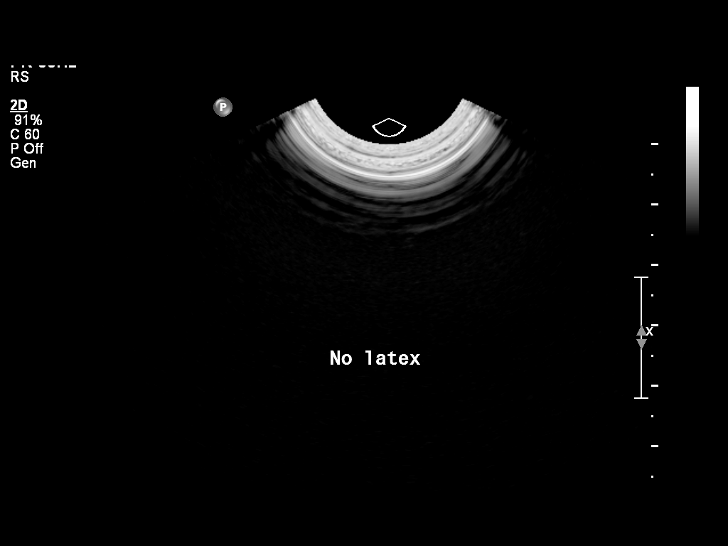
[im 29/64]
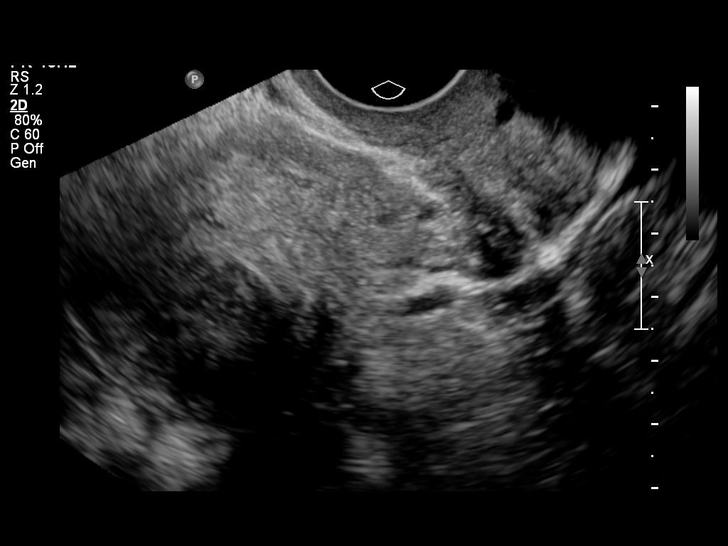
[im 35/64]
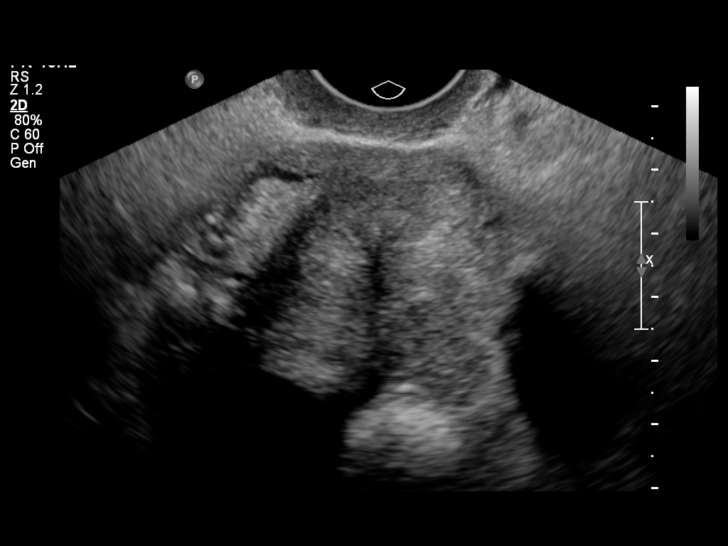
[im 40/64]
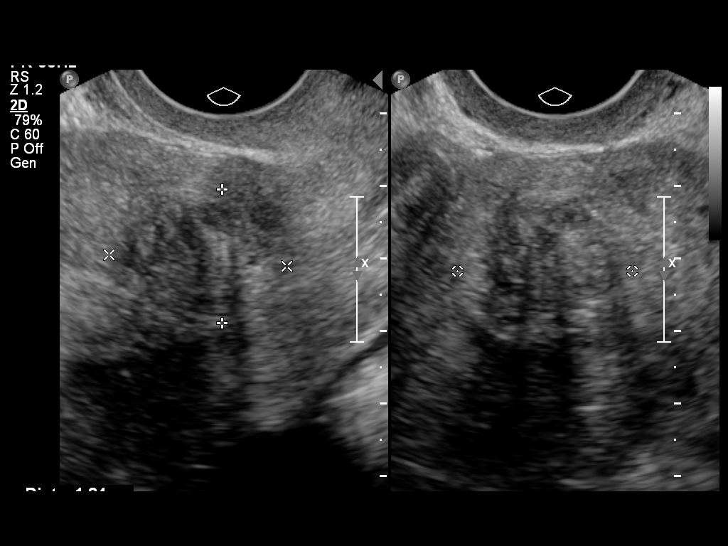
[im 43/64]
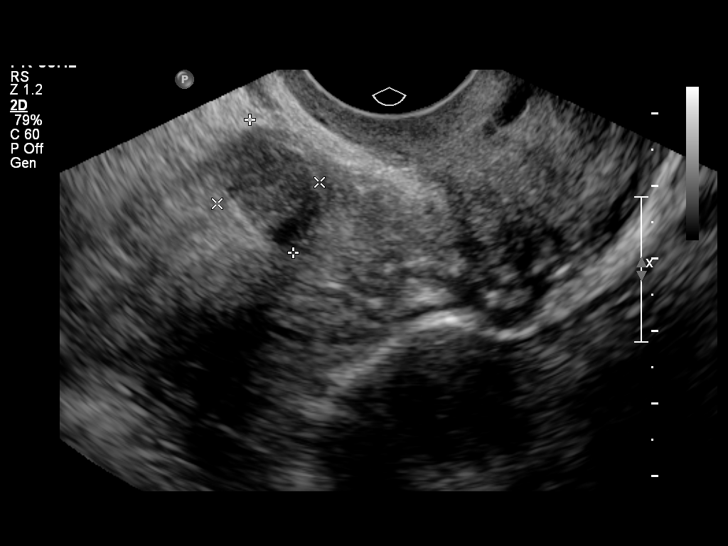
[im 48/64]
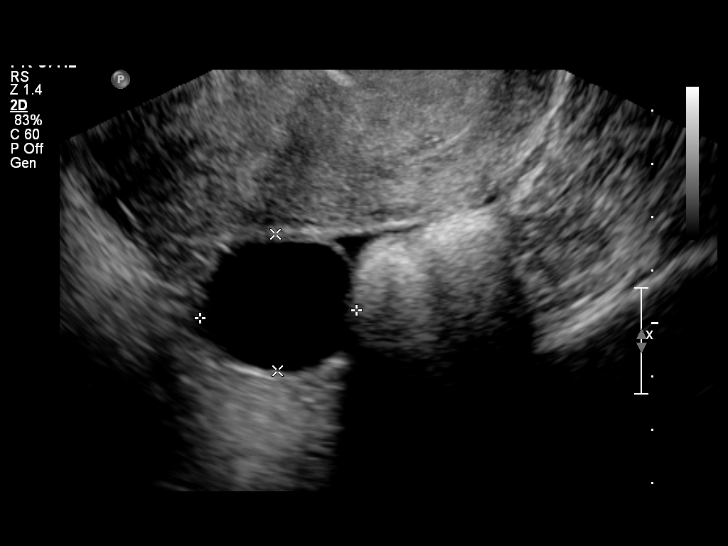
[im 53/64]
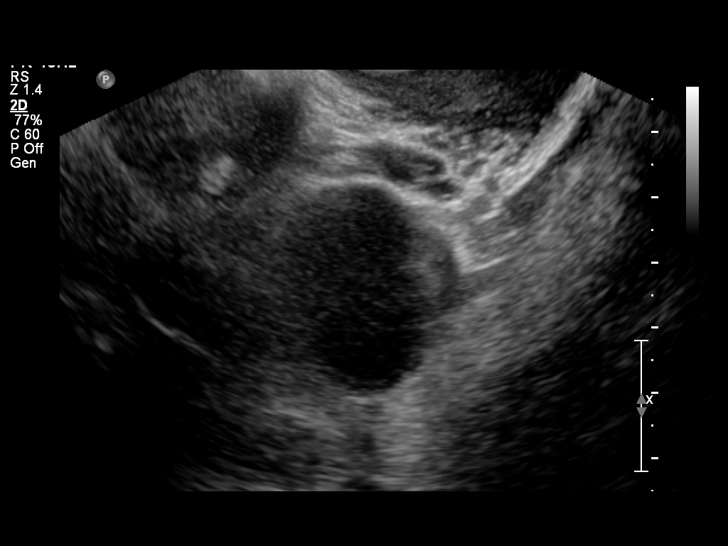
[im 58/64]
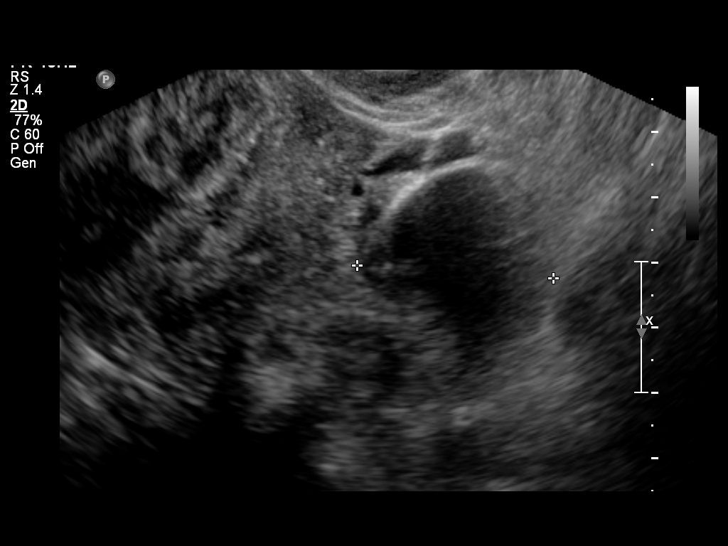
[im 64/64]
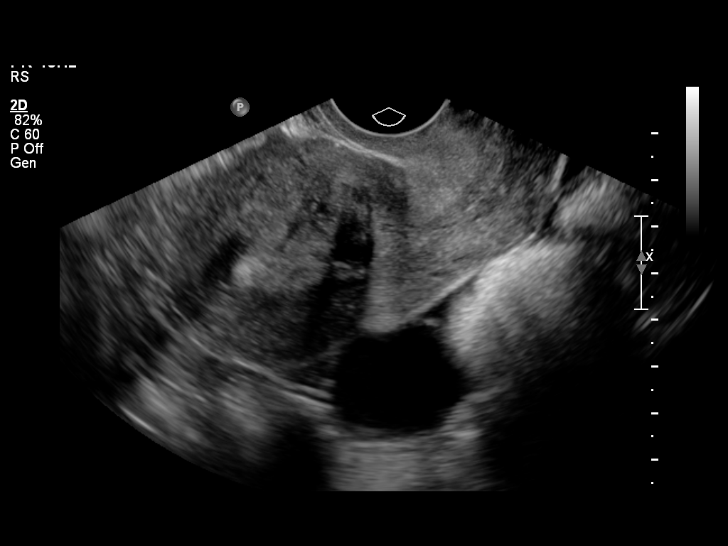

[14 of 25 positions shown; findings below may reference images not displayed]

FINDINGS: Uterus:  9.5 x 5.6 x 6.7cm.   At least 3 separate measureable
fibroids, ranging in size from 2 to 3cm.

Endometrium: 15mm double layer thickness measured transvaginally.
Normal appearance.

Right ovary: Normal appearance/no adnexal mass.  Dominant 3cm
follicle noted.

Left ovary: Normal appearance/no adnexal mass.  3.5cm simple cyst
noted, with benign features.

Other Findings:  No free fluid
IMPRESSION: 1.  Several small uterine fibroids measuring up to 3cm.

2.  3.5cm left ovarian cyst with benign features.  No adnexal mass
or free fluid identified.

## 2013-05-29 ENCOUNTER — Ambulatory Visit (INDEPENDENT_AMBULATORY_CARE_PROVIDER_SITE_OTHER): Payer: Medicare Other | Admitting: Family Medicine

## 2013-05-29 ENCOUNTER — Encounter: Payer: Self-pay | Admitting: Family Medicine

## 2013-05-29 VITALS — BP 135/96 | HR 101 | Wt 188.0 lb

## 2013-05-29 DIAGNOSIS — C541 Malignant neoplasm of endometrium: Secondary | ICD-10-CM

## 2013-05-29 DIAGNOSIS — F430 Acute stress reaction: Secondary | ICD-10-CM

## 2013-05-29 DIAGNOSIS — I10 Essential (primary) hypertension: Secondary | ICD-10-CM

## 2013-05-29 DIAGNOSIS — F438 Other reactions to severe stress: Secondary | ICD-10-CM

## 2013-05-29 DIAGNOSIS — R635 Abnormal weight gain: Secondary | ICD-10-CM | POA: Diagnosis not present

## 2013-05-29 DIAGNOSIS — C549 Malignant neoplasm of corpus uteri, unspecified: Secondary | ICD-10-CM

## 2013-05-29 DIAGNOSIS — F4389 Other reactions to severe stress: Secondary | ICD-10-CM
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
## 2013-05-30 DIAGNOSIS — D1809 Hemangioma of other sites: Secondary | ICD-10-CM | POA: Diagnosis not present

## 2013-05-30 DIAGNOSIS — J45909 Unspecified asthma, uncomplicated: Secondary | ICD-10-CM | POA: Diagnosis not present

## 2013-05-30 DIAGNOSIS — R1310 Dysphagia, unspecified: Secondary | ICD-10-CM | POA: Diagnosis not present

## 2013-05-30 DIAGNOSIS — I10 Essential (primary) hypertension: Secondary | ICD-10-CM | POA: Diagnosis not present

## 2013-05-30 DIAGNOSIS — D649 Anemia, unspecified: Secondary | ICD-10-CM | POA: Diagnosis not present

## 2013-05-30 DIAGNOSIS — Z79899 Other long term (current) drug therapy: Secondary | ICD-10-CM | POA: Diagnosis not present

## 2013-05-30 DIAGNOSIS — I209 Angina pectoris, unspecified: Secondary | ICD-10-CM | POA: Diagnosis not present

## 2013-05-30 DIAGNOSIS — F411 Generalized anxiety disorder: Secondary | ICD-10-CM | POA: Diagnosis not present

## 2013-05-30 DIAGNOSIS — R0602 Shortness of breath: Secondary | ICD-10-CM | POA: Diagnosis not present

## 2013-06-01 IMAGING — CR DG CHEST 2V
2 series · 2 of 2 positions shown · non-contrast
Comparison: 01/23/2012 and prior chest radiographs

CLINICAL DATA: 46-year-old female with cough and shortness of
breath.

CHEST - 2 VIEW

[w chest pa]
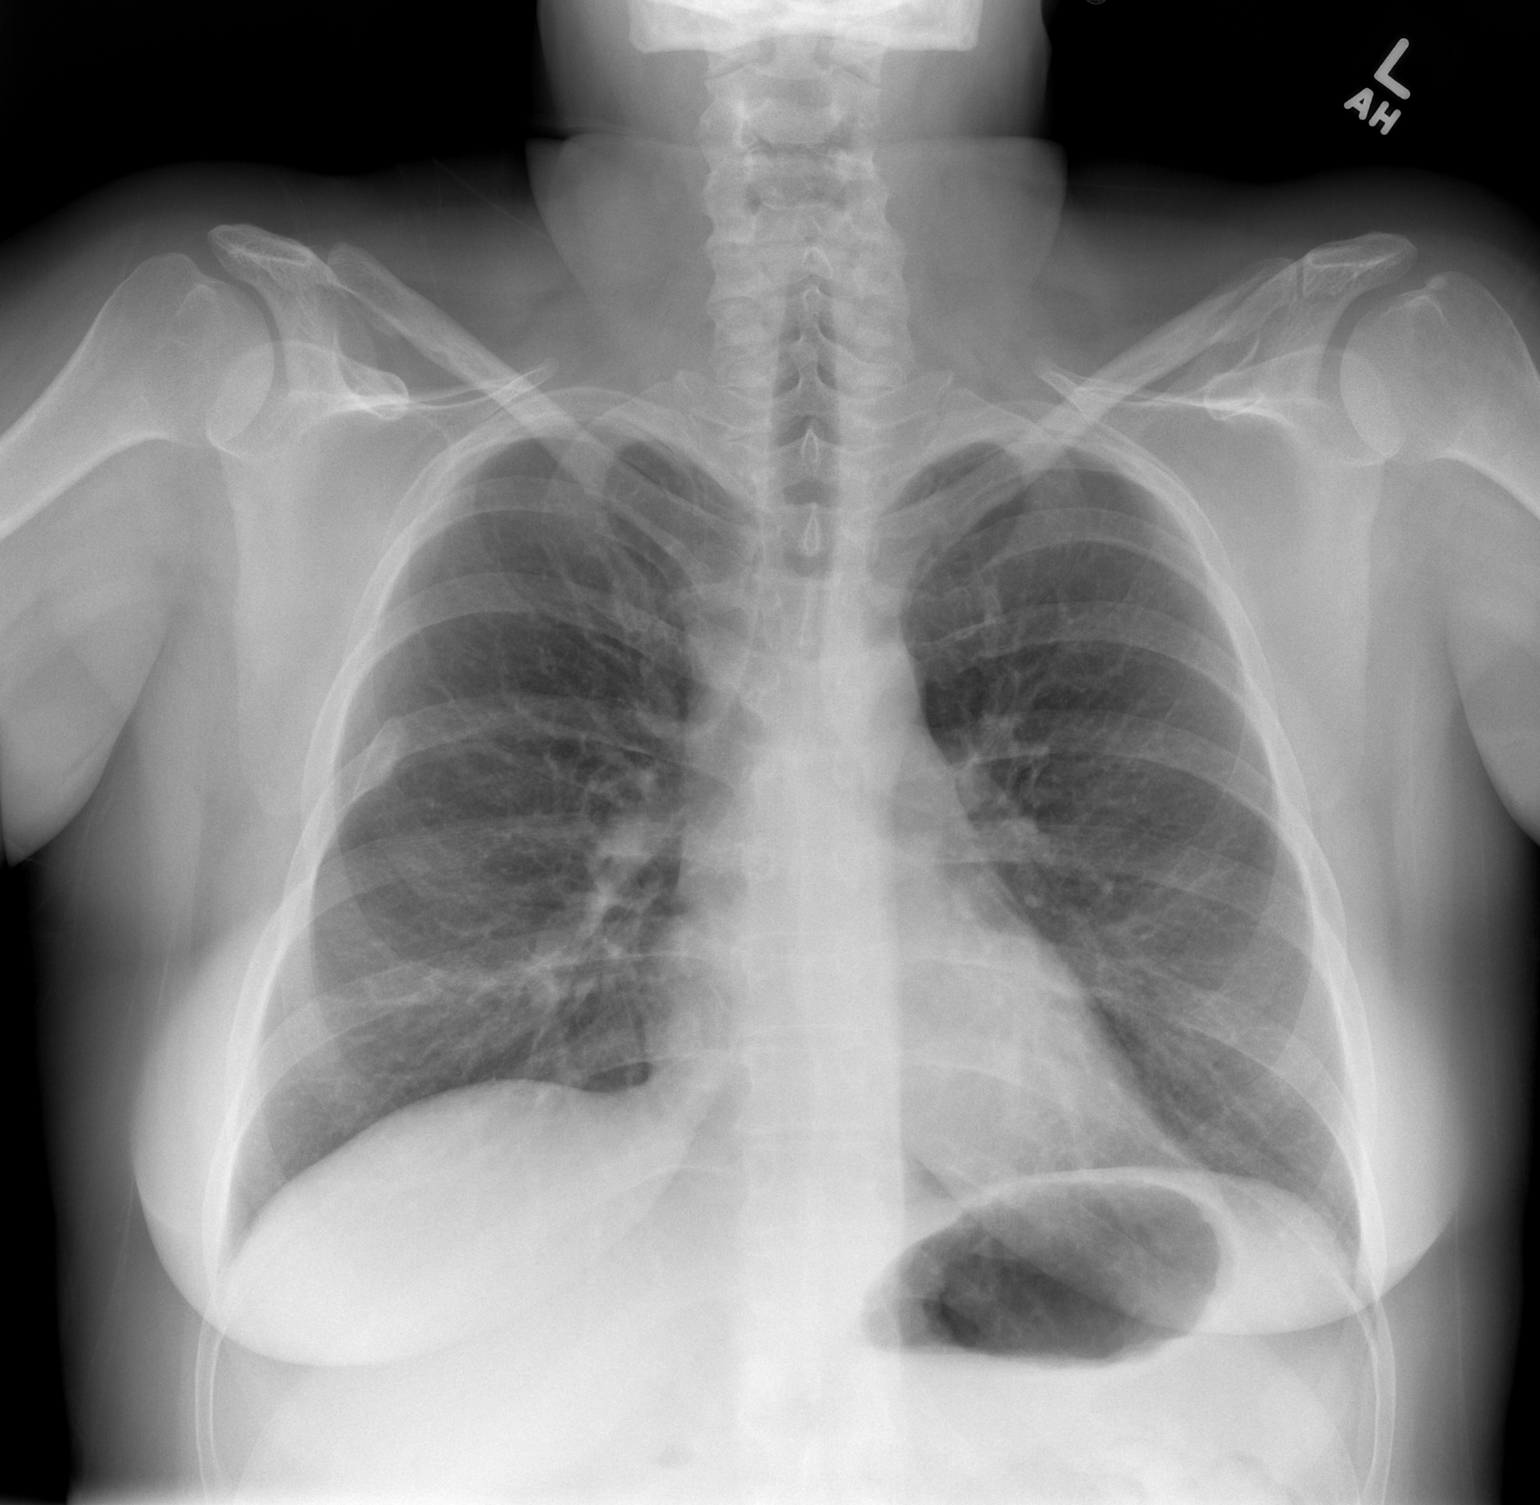

[w chest lat]
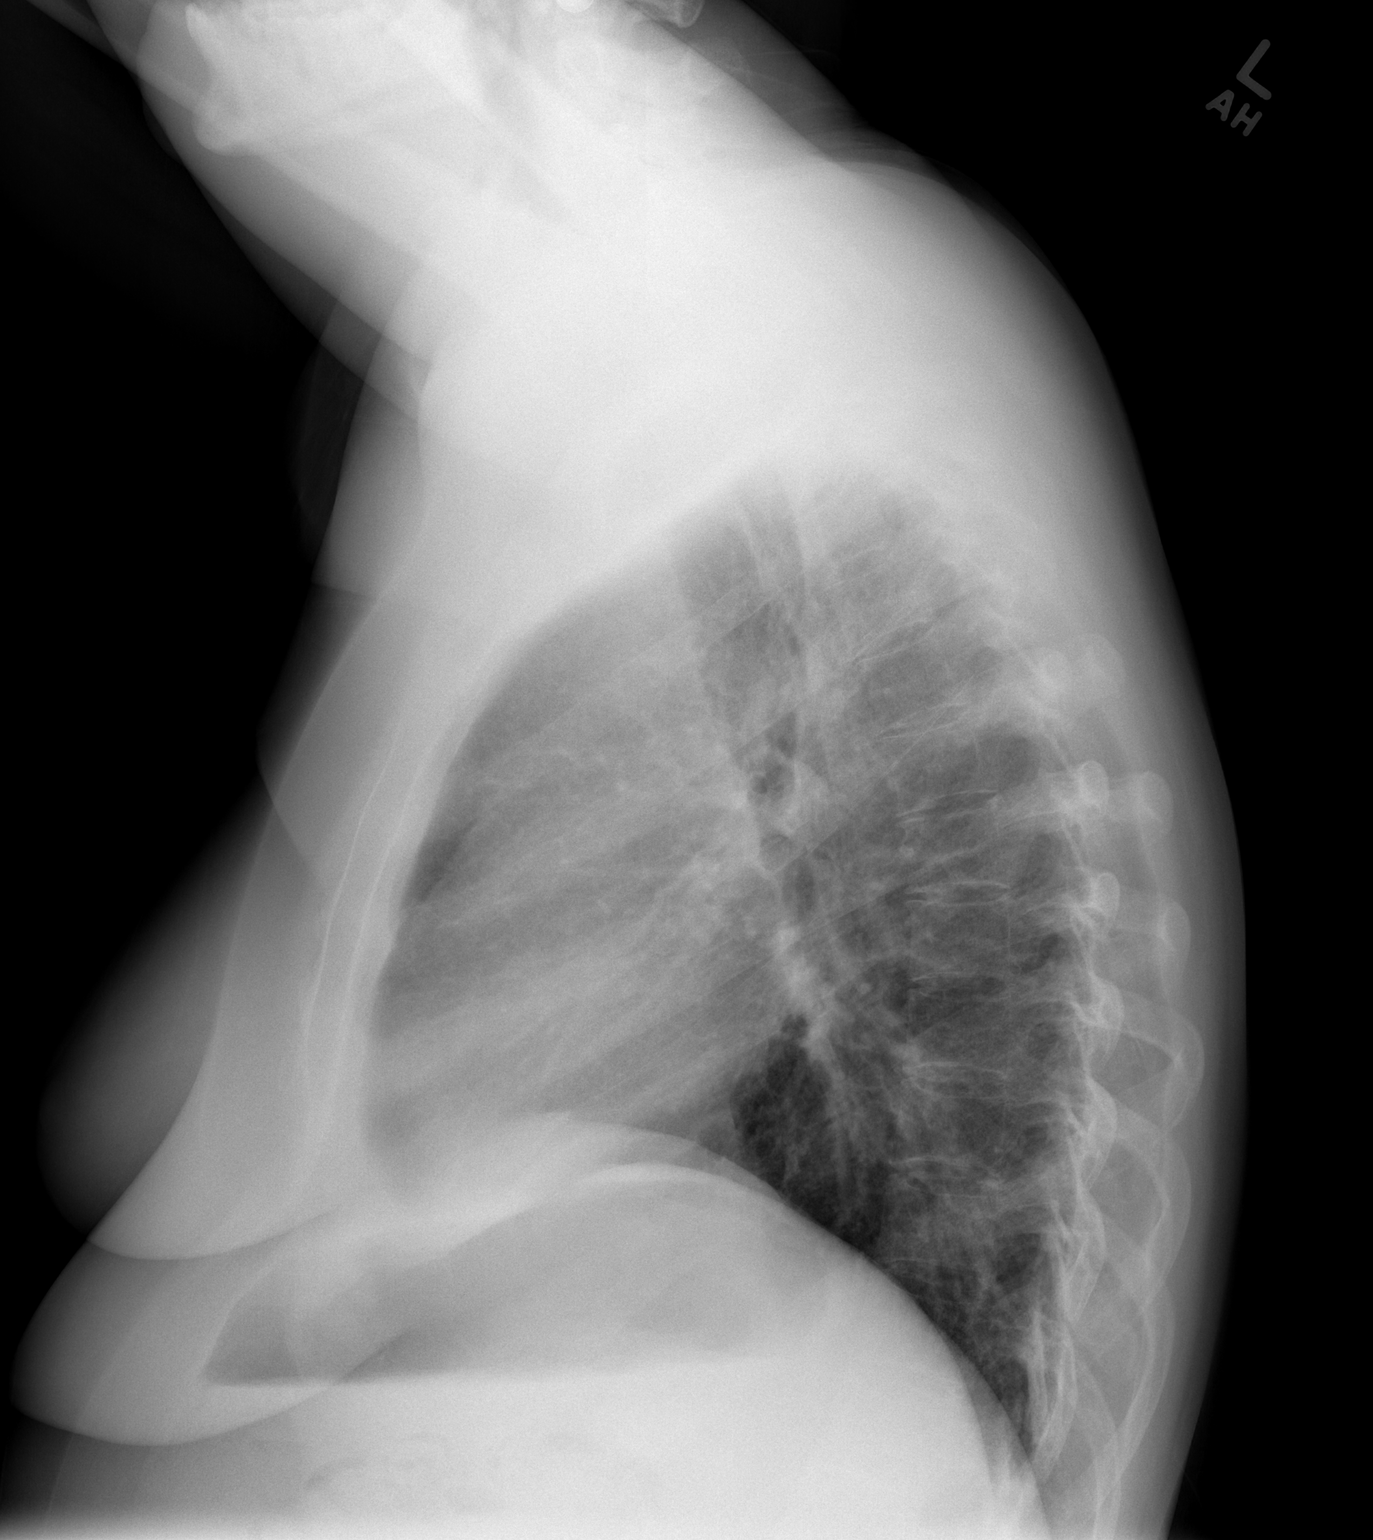

[2 of 2 positions shown; findings below may reference images not displayed]

FINDINGS: The cardiomediastinal silhouette is unremarkable.
Mild peribronchial thickening is stable.
Mild elevation of the right hemidiaphragm is unchanged.
There is no evidence of focal airspace disease, pulmonary edema,
suspicious pulmonary nodule/mass, pleural effusion, or
pneumothorax.
No acute bony abnormalities are identified. A remote right rib
fracture is noted.
IMPRESSION: No evidence of acute cardiopulmonary disease.

## 2013-06-04 ENCOUNTER — Telehealth: Payer: Self-pay | Admitting: *Deleted

## 2013-06-04 ENCOUNTER — Encounter: Payer: Self-pay | Admitting: Sports Medicine

## 2013-06-04 DIAGNOSIS — M545 Low back pain, unspecified: Secondary | ICD-10-CM

## 2013-06-04 DIAGNOSIS — C541 Malignant neoplasm of endometrium: Secondary | ICD-10-CM

## 2013-06-04 DIAGNOSIS — F339 Major depressive disorder, recurrent, unspecified: Secondary | ICD-10-CM | POA: Diagnosis not present

## 2013-06-04 NOTE — Telephone Encounter (Signed)
Pt called and would like for Dr. Linford Arnold to place a referral to [facility].Loralee Pacas [place]

## 2013-06-04 NOTE — Telephone Encounter (Signed)
Referral placed.

## 2013-06-05 DIAGNOSIS — F339 Major depressive disorder, recurrent, unspecified: Secondary | ICD-10-CM | POA: Diagnosis not present

## 2013-06-09 NOTE — Telephone Encounter (Signed)
Pt called advised she gave Tonya two disc for you to review and compare back on 05/29/13 and had not heard anything from our office regarding them. Patient request to have a call back concerning the two disc [phone]. Thanks

## 2013-06-09 NOTE — Telephone Encounter (Signed)
I did review the MRIs, my results are as follows:  Recent C-spine MRI from May 20, 2013 shows a relatively normal cervical spine with the exception of a C5-C6 small disc protrusion with desiccation with a small amount of right-sided foraminal narrowing at the C5-C6 level. Lumbar spine MRI from May 20, 2013 shows degenerative changes predominately at the L5-S1 level with desiccation and protrusion, there is a broad based disc protrusion with extension into the left neural foramen at the L5-S1 level.  There are certainly visualized degenerative changes that can cause her neck and back pain that we can discuss in detail in the office.  Also regarding the T4 vertebral lesion, this needs to be followed up with by either her gyn/onc and/or the physician that ordered the MRIs.  I do not have the official read for the C,L, and T-spine MRIs and do not want to comment on non-orthopaedic MRI findings as this would be outside of my specialty.

## 2013-06-10 DIAGNOSIS — Z880 Allergy status to penicillin: Secondary | ICD-10-CM | POA: Diagnosis not present

## 2013-06-10 DIAGNOSIS — I10 Essential (primary) hypertension: Secondary | ICD-10-CM | POA: Diagnosis not present

## 2013-06-10 DIAGNOSIS — R110 Nausea: Secondary | ICD-10-CM | POA: Diagnosis not present

## 2013-06-10 DIAGNOSIS — Z882 Allergy status to sulfonamides status: Secondary | ICD-10-CM | POA: Diagnosis not present

## 2013-06-10 DIAGNOSIS — R079 Chest pain, unspecified: Secondary | ICD-10-CM | POA: Diagnosis not present

## 2013-06-10 DIAGNOSIS — Z79899 Other long term (current) drug therapy: Secondary | ICD-10-CM | POA: Diagnosis not present

## 2013-06-10 DIAGNOSIS — Z888 Allergy status to other drugs, medicaments and biological substances status: Secondary | ICD-10-CM | POA: Diagnosis not present

## 2013-06-10 DIAGNOSIS — R002 Palpitations: Secondary | ICD-10-CM | POA: Diagnosis not present

## 2013-06-10 DIAGNOSIS — R55 Syncope and collapse: Secondary | ICD-10-CM | POA: Diagnosis not present

## 2013-06-10 DIAGNOSIS — Z885 Allergy status to narcotic agent status: Secondary | ICD-10-CM | POA: Diagnosis not present

## 2013-06-10 DIAGNOSIS — Z881 Allergy status to other antibiotic agents status: Secondary | ICD-10-CM | POA: Diagnosis not present

## 2013-06-11 ENCOUNTER — Encounter: Payer: Self-pay | Admitting: Family Medicine

## 2013-06-11 ENCOUNTER — Ambulatory Visit (INDEPENDENT_AMBULATORY_CARE_PROVIDER_SITE_OTHER): Payer: Medicare Other | Admitting: Family Medicine

## 2013-06-11 VITALS — BP 144/86 | HR 91 | Wt 186.0 lb

## 2013-06-11 DIAGNOSIS — F339 Major depressive disorder, recurrent, unspecified: Secondary | ICD-10-CM | POA: Diagnosis not present

## 2013-06-11 DIAGNOSIS — R110 Nausea: Secondary | ICD-10-CM | POA: Diagnosis not present

## 2013-06-11 DIAGNOSIS — R5381 Other malaise: Secondary | ICD-10-CM

## 2013-06-11 DIAGNOSIS — J069 Acute upper respiratory infection, unspecified: Secondary | ICD-10-CM

## 2013-06-11 DIAGNOSIS — R002 Palpitations: Secondary | ICD-10-CM

## 2013-06-11 DIAGNOSIS — E559 Vitamin D deficiency, unspecified: Secondary | ICD-10-CM

## 2013-06-11 DIAGNOSIS — R0789 Other chest pain: Secondary | ICD-10-CM | POA: Diagnosis not present

## 2013-06-11 DIAGNOSIS — R4702 Dysphasia: Secondary | ICD-10-CM

## 2013-06-11 DIAGNOSIS — R4789 Other speech disturbances: Secondary | ICD-10-CM

## 2013-06-11 LAB — CBC WITH DIFFERENTIAL/PLATELET
Basophils Absolute: 0.1 10*3/uL (ref 0.0–0.1)
Basophils Relative: 1 % (ref 0–1)
Eosinophils Absolute: 0.1 10*3/uL (ref 0.0–0.7)
Eosinophils Relative: 2 % (ref 0–5)
HCT: 40.5 % (ref 36.0–46.0)
Hemoglobin: 13.3 g/dL (ref 12.0–15.0)
Lymphocytes Relative: 25 % (ref 12–46)
Lymphs Abs: 1.4 10*3/uL (ref 0.7–4.0)
MCH: 28.7 pg (ref 26.0–34.0)
MCHC: 33 g/dL (ref 30.0–36.0)
MCV: 87.5 fL (ref 78.0–100.0)
Monocytes Absolute: 0.5 10*3/uL (ref 0.1–1.0)
Monocytes Relative: 9 % (ref 3–12)
Neutro Abs: 3.5 10*3/uL (ref 1.7–7.7)
Neutrophils Relative %: 64 % (ref 43–77)
Platelets: 237 10*3/uL (ref 150–400)
RBC: 4.63 MIL/uL (ref 3.87–5.11)
RDW: 13.9 % (ref 11.5–15.5)
WBC: 5.5 10*3/uL (ref 4.0–10.5)

## 2013-06-11 LAB — TROPONIN I: Troponin I: <0.01 ng/mL (ref <0.06)

## 2013-06-11 NOTE — Telephone Encounter (Signed)
Pt told of findings today @ [facility].Loralee Pacas [place]

## 2013-06-11 NOTE — Progress Notes (Signed)
Subjective:    Patient ID: Jennifer [name], female    DOB: 06/15/1966, 47 y.o.   MRN: [id number]  HPI Having more episodes of feeling her heart flip-flopping.  She was fine last week (was playing her granduaghter last week)  but feels worse this week. Has been veyr fatigued tis week and nauseated today. Has had 3-4 BM today.  Her right side of abdomen feels tight. Having CP as well. Seems to happen when she is feeling freqeunt flip-flopds.  Went to ED nad had normal CXR.  Has green phlegm in her throat and nose.  Fells like the phlegm won't loosen up enough for her to cough it out.  Uses he nasal saline reguarly. Marland Kitchen Has been throat clearing again.  No fever, chills or sweats. She is schedule for a swallow study next months at [facility].  Felt a little lightheaded yesterday. Having some nasal congestion. She was given a prescription for Zofran for the nausea but has not filled it yet but she does have it with her today.   Review of Systems     Objective:   Physical Exam  Constitutional: She is oriented to person, place, and time. She appears well-developed and well-nourished.  HENT:  Head: Normocephalic and atraumatic.  Right Ear: External ear normal.  Left Ear: External ear normal.  Nose: Nose normal.  Mouth/Throat: Oropharynx is clear and moist.  TMs and canals are clear.   Eyes: Conjunctivae and EOM are normal. Pupils are equal, round, and reactive to light.  Neck: Neck supple. No thyromegaly present.  Cardiovascular: Normal rate, regular rhythm and normal heart sounds.   Pulmonary/Chest: Effort normal and breath sounds normal. She has no wheezes.  Lymphadenopathy:    She has no cervical adenopathy.  Neurological: She is alert and oriented to person, place, and time.  Skin: Skin is warm and dry.  Psychiatric: She has a normal mood and affect.          Assessment & Plan:  Palpitations-tried to give her reassurance. She has not tolerated beta blockers well. So we really don't have a  great option for helping to control her symptoms. She's very concerned about the chest discomfort that. Continue amlodipine 2.5 mg for now. If she takes it regularly.  Atypical chest pain-tried reassure her that I feel like this is her fibromyalgia flaring when she gets pain across the upper portion of her chest bilaterally. She's even tender on both upper chest walls as well as around the shoulders and underneath each axilla. This is typical for her. I did go ahead and order stat cardiac enzymes because of bleed she still needs some reassurance mentally. She feels like just having had EKG by itself emergency department was not sufficient enough to provide first reassurance as far as her heart is concerned.  Upper respiratory infection-due for like she's getting some early signs of upper respiratory infection with nasal congestion, sputum production, cough, nausea and some loose stools. Still little bit early to say if this is bacterial or not and she typically does not tolerate antibiotics very well also encouraged lots of fluids, hydration and call if she feels that she suddenly getting worse or spikes a fever.  Dysphasia-she did have a normal EGD but they're scheduling her for a swallow study. They do feel that she's having some significant issues with her upper airway causing regurgitation and coughing and choking episodes. She's also starting a little bit more of a hoarse voice from the coughing that started a  couple days ago. I did encourage her to use Zofran as needed for nausea and fill the prescription she was given to the emergency department.  Fibromyalgia- currently flaring. Recommend rest

## 2013-06-12 ENCOUNTER — Telehealth: Payer: Self-pay | Admitting: *Deleted

## 2013-06-12 ENCOUNTER — Ambulatory Visit: Payer: Medicare Other | Admitting: Family Medicine

## 2013-06-12 ENCOUNTER — Encounter: Payer: Self-pay | Admitting: Family Medicine

## 2013-06-12 DIAGNOSIS — M25572 Pain in left ankle and joints of left foot: Secondary | ICD-10-CM

## 2013-06-12 LAB — COMPLETE METABOLIC PANEL WITH GFR
ALT: 17 U/L (ref 0–35)
AST: 12 U/L (ref 0–37)
Albumin: 4.1 g/dL (ref 3.5–5.2)
Alkaline Phosphatase: 60 U/L (ref 39–117)
BUN: 10 mg/dL (ref 6–23)
CO2: 21 mEq/L (ref 19–32)
Calcium: 9.5 mg/dL (ref 8.4–10.5)
Chloride: 107 mEq/L (ref 96–112)
Creat: 0.51 mg/dL (ref 0.50–1.10)
GFR, Est African American: >89 mL/min
GFR, Est Non African American: >89 mL/min
Glucose, Bld: 110 mg/dL — ABNORMAL HIGH (ref 70–99)
Potassium: 4.2 mEq/L (ref 3.5–5.3)
Sodium: 140 mEq/L (ref 135–145)
Total Bilirubin: 0.3 mg/dL (ref 0.3–1.2)
Total Protein: 6.9 g/dL (ref 6.0–8.3)

## 2013-06-12 LAB — CK TOTAL AND CKMB (NOT AT ARMC)
CK, MB: 0.7 ng/mL (ref 0.3–4.0)
Total CK: 19 U/L (ref 7–177)

## 2013-06-12 LAB — VITAMIN D 25 HYDROXY (VIT D DEFICIENCY, FRACTURES): Vit D, 25-Hydroxy: 30 ng/mL (ref 30–89)

## 2013-06-12 LAB — VITAMIN B12: Vitamin B-12: 391 pg/mL (ref 211–911)

## 2013-06-12 NOTE — Telephone Encounter (Signed)
Pt reports that she is more tender today in her ankle. Still having flutters. Please advise. Pt reports using tylenol and her foot is elevated.Loralee Pacas Megargel

## 2013-06-12 NOTE — Telephone Encounter (Signed)
Would she be okay with going ahead and getting an x-ray of the ankle?

## 2013-06-12 NOTE — Telephone Encounter (Signed)
Pt is ok with this. Order placed.Jennifer [name]

## 2013-06-13 DIAGNOSIS — E876 Hypokalemia: Secondary | ICD-10-CM | POA: Diagnosis not present

## 2013-06-13 DIAGNOSIS — R109 Unspecified abdominal pain: Secondary | ICD-10-CM | POA: Diagnosis not present

## 2013-06-13 DIAGNOSIS — G8929 Other chronic pain: Secondary | ICD-10-CM | POA: Diagnosis not present

## 2013-06-13 DIAGNOSIS — R110 Nausea: Secondary | ICD-10-CM | POA: Diagnosis not present

## 2013-06-13 DIAGNOSIS — R1011 Right upper quadrant pain: Secondary | ICD-10-CM | POA: Diagnosis not present

## 2013-06-13 DIAGNOSIS — I10 Essential (primary) hypertension: Secondary | ICD-10-CM | POA: Diagnosis not present

## 2013-06-13 IMAGING — CT CT MAXILLOFACIAL W/O CM
3 series · 15 of 47 positions shown, 18 images · non-contrast
Comparison: Head CT 02/19/2012.  Neck CT 05/30/2011.

CLINICAL DATA: Right facial swelling since falling 2 weeks ago.
Question TMJ injury or parotid calculus.

CT MAXILLOFACIAL WITHOUT CONTRAST
TECHNIQUE: Multidetector CT imaging of the maxillofacial
structures was performed. Multiplanar CT image reconstructions were
also generated.

[Series 3: maxillofacial 2.0 h30s st · axial · 0.30mm/px · z∈[+1174,+1324]mm · 9 of 87 slices shown, 12 images]
[im 6/87  brain]
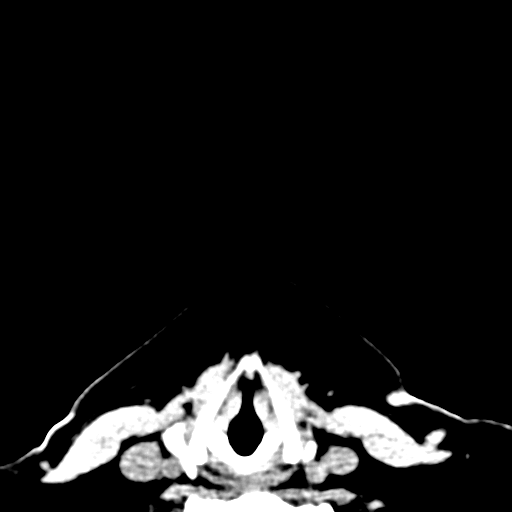
[im 6/87  bone]
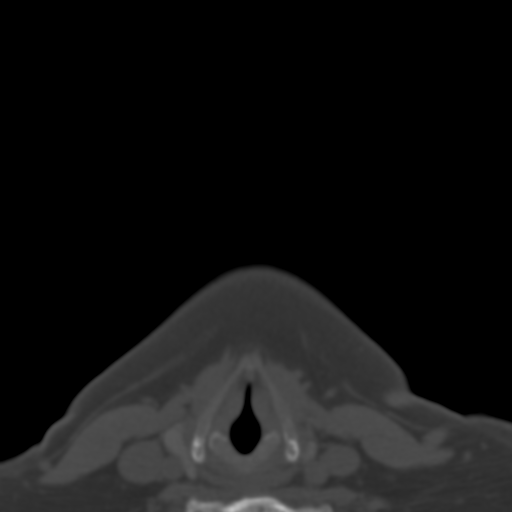
[im 15/87  bone]
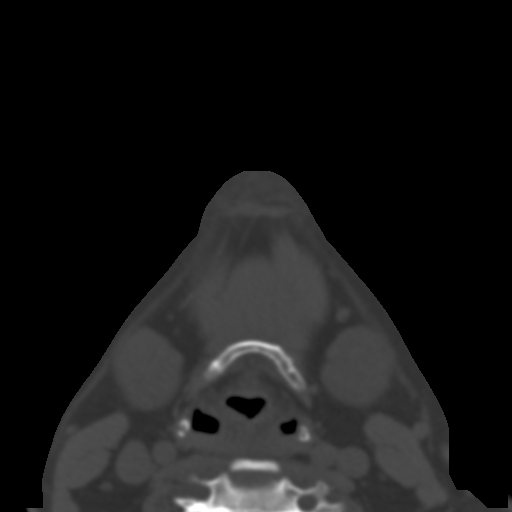
[im 24/87  bone]
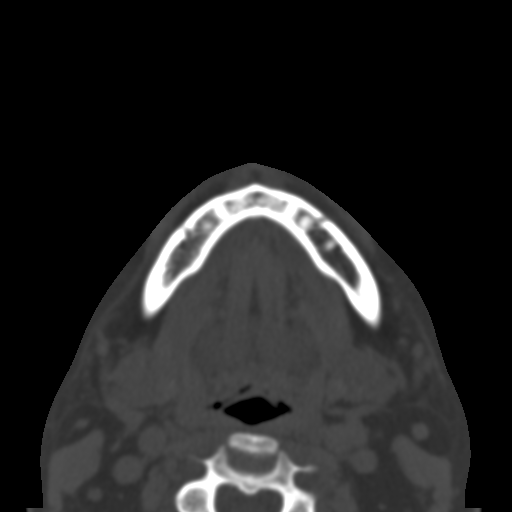
[im 33/87  bone]
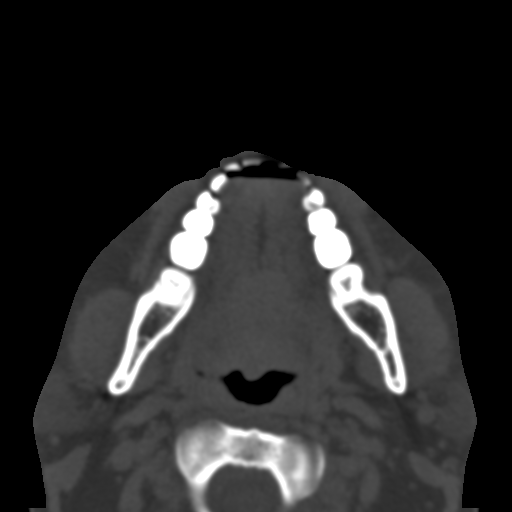
[im 45/87  brain]
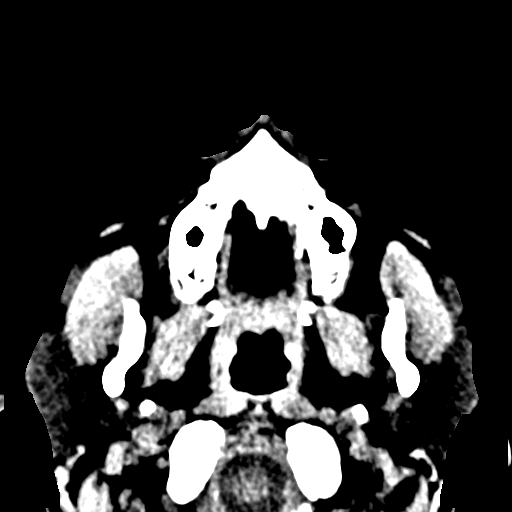
[im 45/87  bone]
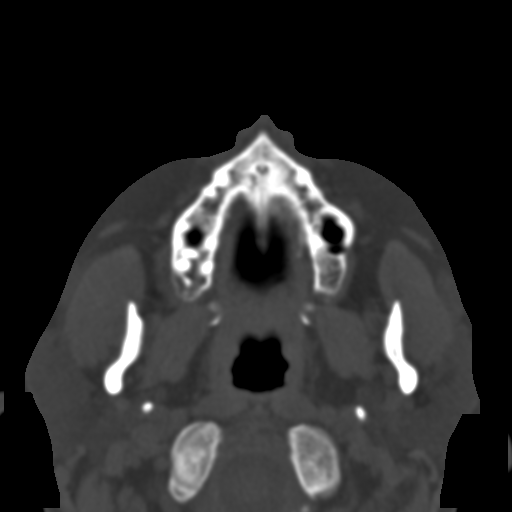
[im 54/87  bone]
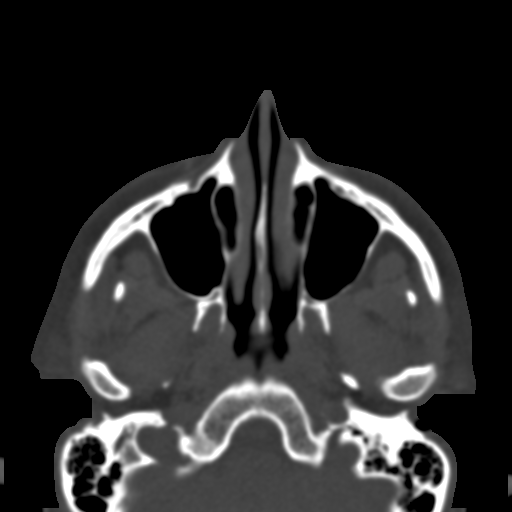
[im 63/87  bone]
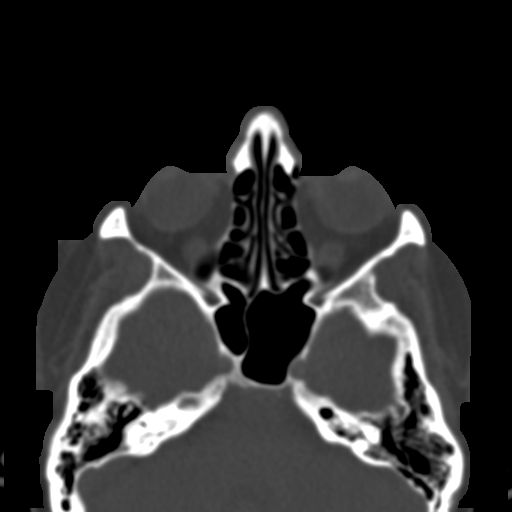
[im 72/87  bone]
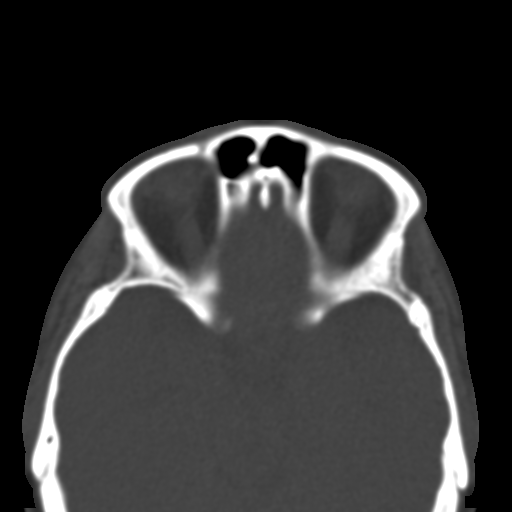
[im 81/87  brain]
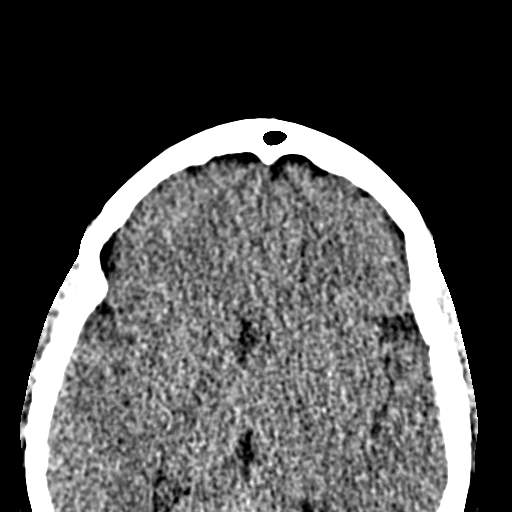
[im 81/87  bone]
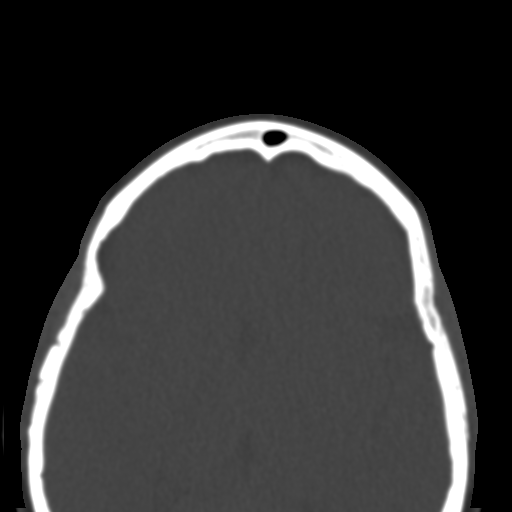

[Series 8: maxillofacial 2.0 coronal · coronal · 0.31mm/px · 3 of 67 slices shown]
[im 23/67  bone]
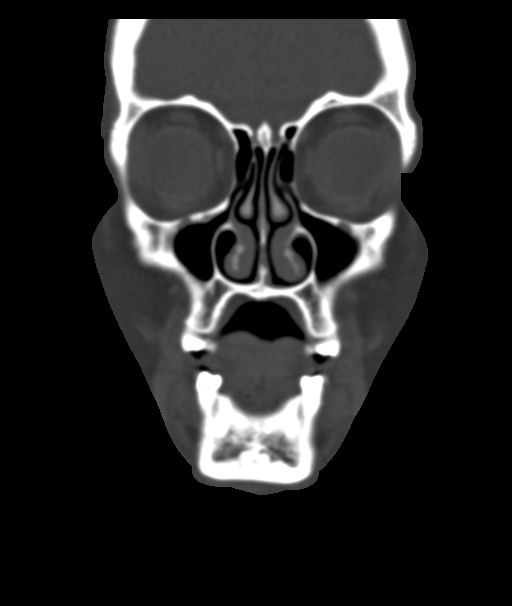
[im 30/67  bone]
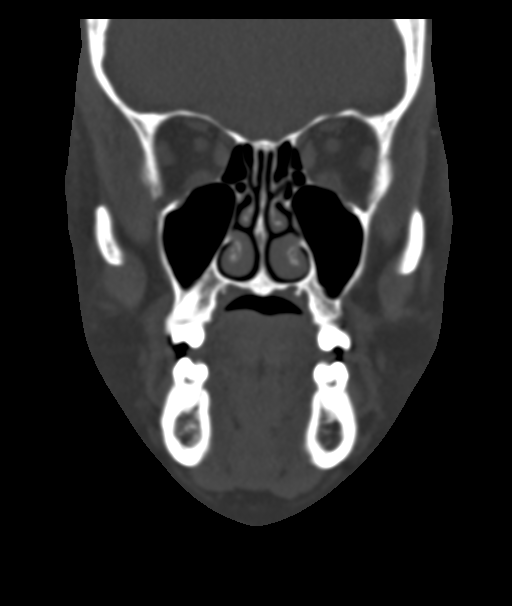
[im 37/67  bone]
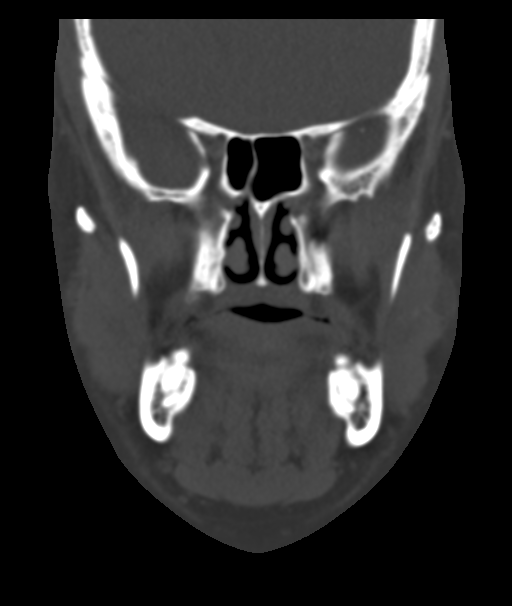

[Series 9: maxillofacial 2.0 sagittal · sagittal · 0.28mm/px · 3 of 76 slices shown]
[im 26/76  bone]
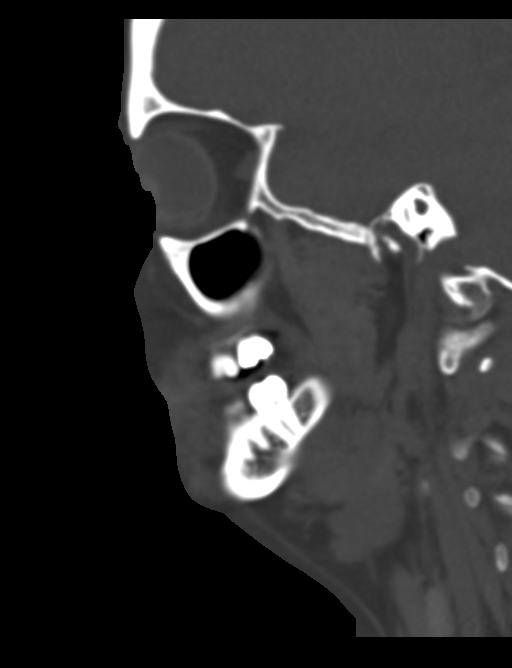
[im 38/76  bone]
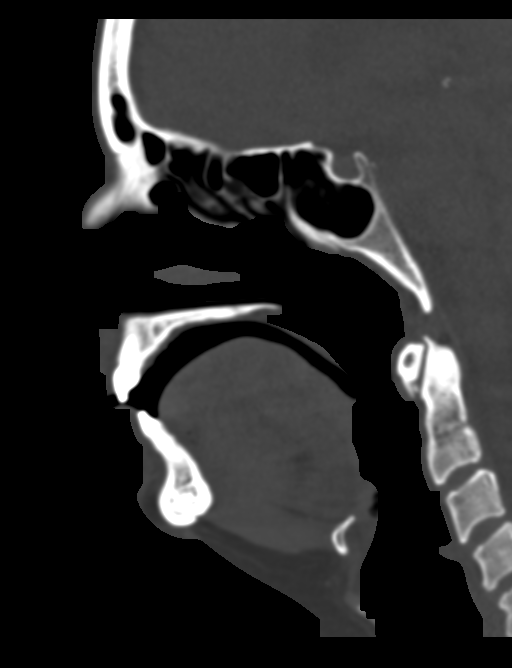
[im 51/76  bone]
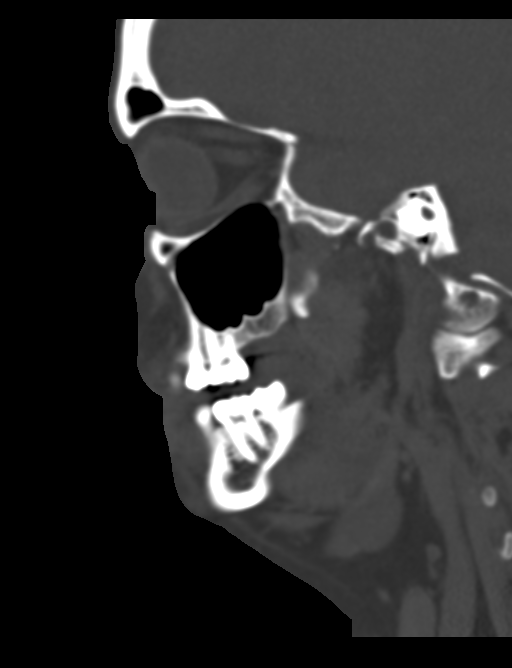

[15 of 47 positions shown; findings below may reference images not displayed]

FINDINGS: There is no evidence of acute fracture or dislocation.
The paranasal sinuses, mastoid air cells and middle ears are clear.
The mandible and temporomandibular joints appear normal.

The parotid glands appear symmetric without surrounding
inflammatory change.  There is no evidence of sialolithiasis.  The
visualized thyroid and submandibular glands appear normal.  No
orbital abnormalities are identified.  There is no focal soft
tissue abnormality lateral to the right zygoma where a marker was
placed.
IMPRESSION: Normal examination.  No evidence of TMJ injury or sialolithiasis.

## 2013-06-14 DIAGNOSIS — R1011 Right upper quadrant pain: Secondary | ICD-10-CM | POA: Diagnosis not present

## 2013-06-16 ENCOUNTER — Telehealth: Payer: Self-pay | Admitting: *Deleted

## 2013-06-16 ENCOUNTER — Ambulatory Visit (HOSPITAL_BASED_OUTPATIENT_CLINIC_OR_DEPARTMENT_OTHER)
Admission: RE | Admit: 2013-06-16 | Discharge: 2013-06-16 | Disposition: A | Discharge status: Home / Self Care | Payer: Medicare Other | Source: Ambulatory Visit | Attending: Family Medicine | Admitting: Family Medicine

## 2013-06-16 DIAGNOSIS — M25579 Pain in unspecified ankle and joints of unspecified foot: Secondary | ICD-10-CM | POA: Insufficient documentation

## 2013-06-16 DIAGNOSIS — M25572 Pain in left ankle and joints of left foot: Secondary | ICD-10-CM

## 2013-06-16 DIAGNOSIS — F339 Major depressive disorder, recurrent, unspecified: Secondary | ICD-10-CM | POA: Diagnosis not present

## 2013-06-18 DIAGNOSIS — E559 Vitamin D deficiency, unspecified: Secondary | ICD-10-CM | POA: Diagnosis not present

## 2013-06-18 DIAGNOSIS — R1310 Dysphagia, unspecified: Secondary | ICD-10-CM | POA: Diagnosis not present

## 2013-06-18 DIAGNOSIS — R7309 Other abnormal glucose: Secondary | ICD-10-CM | POA: Diagnosis not present

## 2013-06-18 DIAGNOSIS — IMO0001 Reserved for inherently not codable concepts without codable children: Secondary | ICD-10-CM | POA: Diagnosis not present

## 2013-06-19 DIAGNOSIS — R000 Tachycardia, unspecified: Secondary | ICD-10-CM | POA: Diagnosis not present

## 2013-06-19 DIAGNOSIS — R197 Diarrhea, unspecified: Secondary | ICD-10-CM | POA: Diagnosis not present

## 2013-06-19 DIAGNOSIS — R110 Nausea: Secondary | ICD-10-CM | POA: Diagnosis not present

## 2013-06-19 DIAGNOSIS — R002 Palpitations: Secondary | ICD-10-CM | POA: Diagnosis not present

## 2013-06-19 DIAGNOSIS — I10 Essential (primary) hypertension: Secondary | ICD-10-CM | POA: Diagnosis not present

## 2013-06-19 DIAGNOSIS — J3489 Other specified disorders of nose and nasal sinuses: Secondary | ICD-10-CM | POA: Diagnosis not present

## 2013-06-19 DIAGNOSIS — R61 Generalized hyperhidrosis: Secondary | ICD-10-CM | POA: Diagnosis not present

## 2013-06-19 DIAGNOSIS — B9789 Other viral agents as the cause of diseases classified elsewhere: Secondary | ICD-10-CM | POA: Diagnosis not present

## 2013-06-19 DIAGNOSIS — J069 Acute upper respiratory infection, unspecified: Secondary | ICD-10-CM | POA: Diagnosis not present

## 2013-06-19 DIAGNOSIS — R109 Unspecified abdominal pain: Secondary | ICD-10-CM | POA: Diagnosis not present

## 2013-06-19 DIAGNOSIS — R6883 Chills (without fever): Secondary | ICD-10-CM | POA: Diagnosis not present

## 2013-06-20 DIAGNOSIS — K089 Disorder of teeth and supporting structures, unspecified: Secondary | ICD-10-CM | POA: Diagnosis not present

## 2013-06-20 DIAGNOSIS — R110 Nausea: Secondary | ICD-10-CM | POA: Diagnosis not present

## 2013-06-20 DIAGNOSIS — R9431 Abnormal electrocardiogram [ECG] [EKG]: Secondary | ICD-10-CM | POA: Diagnosis not present

## 2013-06-20 DIAGNOSIS — R109 Unspecified abdominal pain: Secondary | ICD-10-CM | POA: Diagnosis not present

## 2013-06-20 DIAGNOSIS — R079 Chest pain, unspecified: Secondary | ICD-10-CM | POA: Diagnosis not present

## 2013-06-20 DIAGNOSIS — F411 Generalized anxiety disorder: Secondary | ICD-10-CM | POA: Diagnosis not present

## 2013-06-20 DIAGNOSIS — R1110 Vomiting, unspecified: Secondary | ICD-10-CM | POA: Diagnosis not present

## 2013-06-20 DIAGNOSIS — R220 Localized swelling, mass and lump, head: Secondary | ICD-10-CM | POA: Diagnosis not present

## 2013-06-20 DIAGNOSIS — J45909 Unspecified asthma, uncomplicated: Secondary | ICD-10-CM | POA: Diagnosis not present

## 2013-06-20 DIAGNOSIS — J069 Acute upper respiratory infection, unspecified: Secondary | ICD-10-CM | POA: Diagnosis not present

## 2013-06-20 DIAGNOSIS — B9789 Other viral agents as the cause of diseases classified elsewhere: Secondary | ICD-10-CM | POA: Diagnosis not present

## 2013-06-20 DIAGNOSIS — R0602 Shortness of breath: Secondary | ICD-10-CM | POA: Diagnosis not present

## 2013-06-21 DIAGNOSIS — R0602 Shortness of breath: Secondary | ICD-10-CM | POA: Diagnosis not present

## 2013-06-21 DIAGNOSIS — Z8673 Personal history of transient ischemic attack (TIA), and cerebral infarction without residual deficits: Secondary | ICD-10-CM | POA: Diagnosis not present

## 2013-06-21 DIAGNOSIS — R220 Localized swelling, mass and lump, head: Secondary | ICD-10-CM | POA: Diagnosis not present

## 2013-06-21 DIAGNOSIS — R05 Cough: Secondary | ICD-10-CM | POA: Diagnosis not present

## 2013-06-21 DIAGNOSIS — K148 Other diseases of tongue: Secondary | ICD-10-CM | POA: Diagnosis not present

## 2013-06-21 DIAGNOSIS — R059 Cough, unspecified: Secondary | ICD-10-CM | POA: Diagnosis not present

## 2013-06-21 DIAGNOSIS — R079 Chest pain, unspecified: Secondary | ICD-10-CM | POA: Diagnosis not present

## 2013-06-21 DIAGNOSIS — Z8679 Personal history of other diseases of the circulatory system: Secondary | ICD-10-CM | POA: Diagnosis not present

## 2013-06-21 DIAGNOSIS — Z79899 Other long term (current) drug therapy: Secondary | ICD-10-CM | POA: Diagnosis not present

## 2013-06-21 DIAGNOSIS — R5381 Other malaise: Secondary | ICD-10-CM | POA: Diagnosis not present

## 2013-06-21 DIAGNOSIS — J029 Acute pharyngitis, unspecified: Secondary | ICD-10-CM | POA: Diagnosis not present

## 2013-06-21 DIAGNOSIS — R002 Palpitations: Secondary | ICD-10-CM | POA: Diagnosis not present

## 2013-06-21 DIAGNOSIS — I10 Essential (primary) hypertension: Secondary | ICD-10-CM | POA: Diagnosis not present

## 2013-06-21 IMAGING — US US PELVIS COMPLETE
1 series · 14 of 25 positions shown · non-contrast
Comparison: 03/04/2012

CLINICAL DATA: Abdominal pain.

TRANSABDOMINAL AND TRANSVAGINAL ULTRASOUND OF PELVIS
TECHNIQUE: Both transabdominal and transvaginal ultrasound
examinations of the pelvis were performed. Transabdominal technique
was performed for global imaging of the pelvis including uterus,
ovaries, adnexal regions, and pelvic cul-de-sac.
It was necessary to proceed with endovaginal exam following the
transabdominal exam to visualize the U S.

[Series 1: us pelvis complete · 0.30mm/px · 14 of 38 slices shown]
[im 1/38]
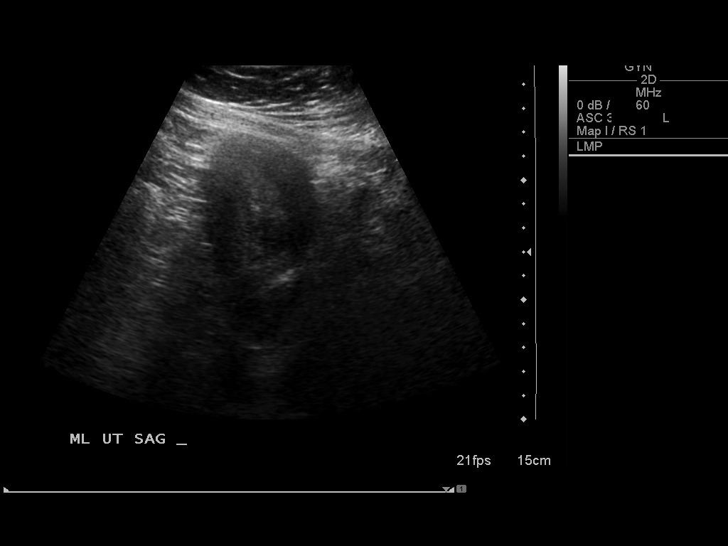
[im 4/38]
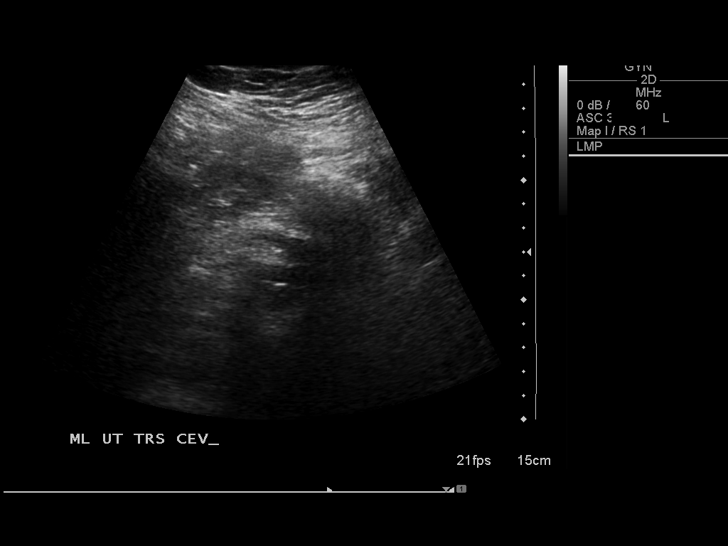
[im 7/38]
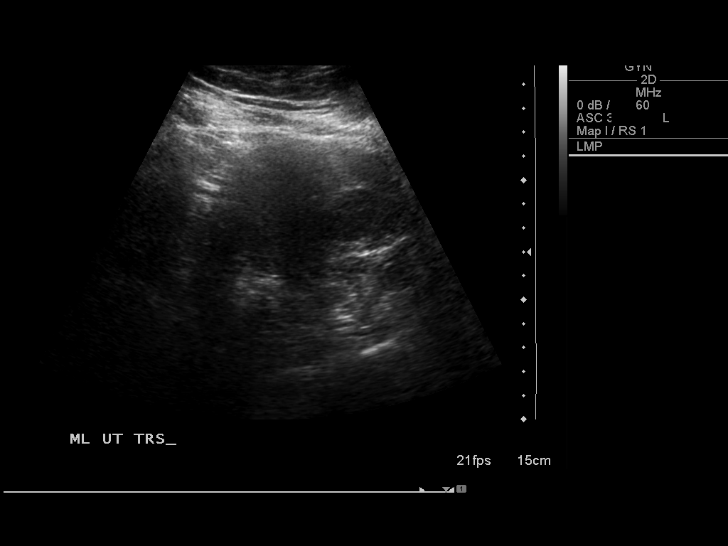
[im 10/38]
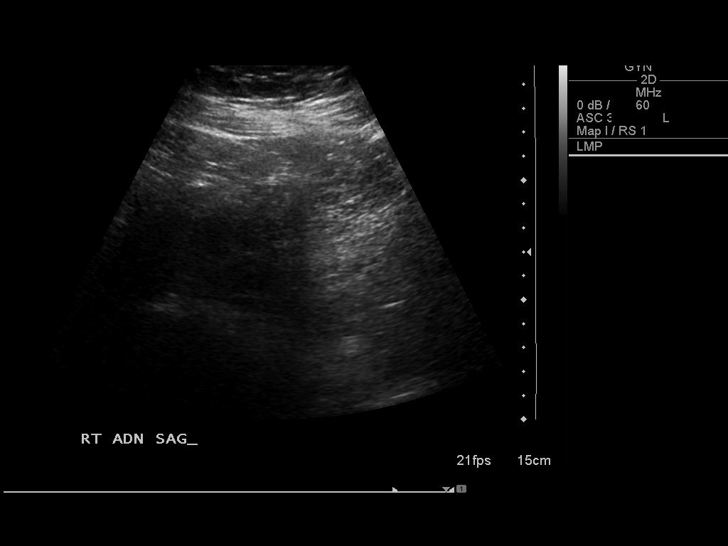
[im 13/38]
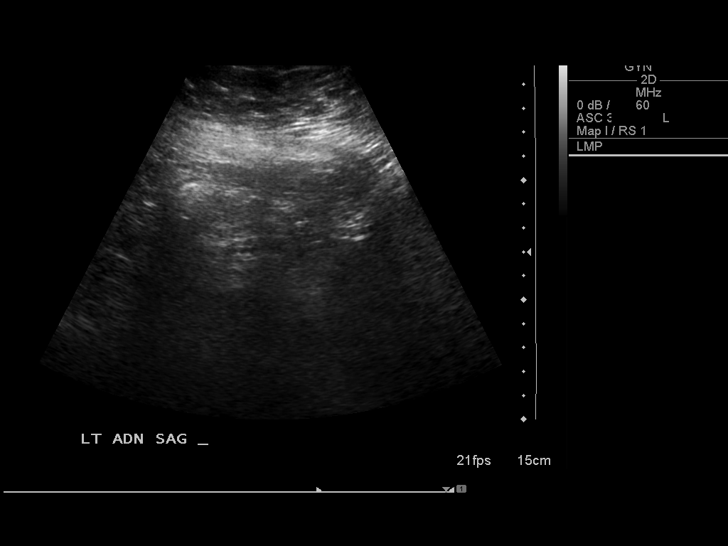
[im 14/38]
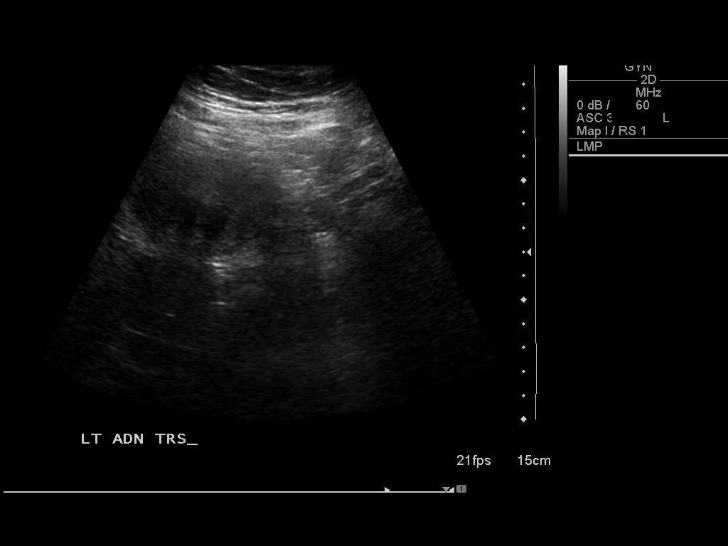
[im 17/38]
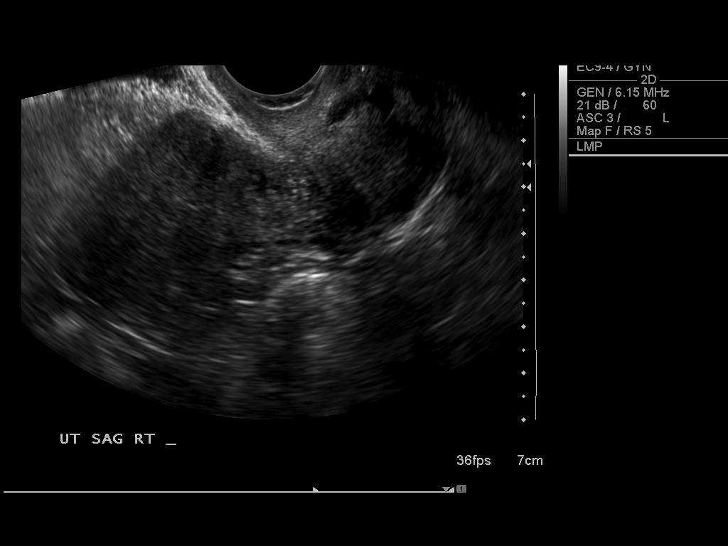
[im 21/38]
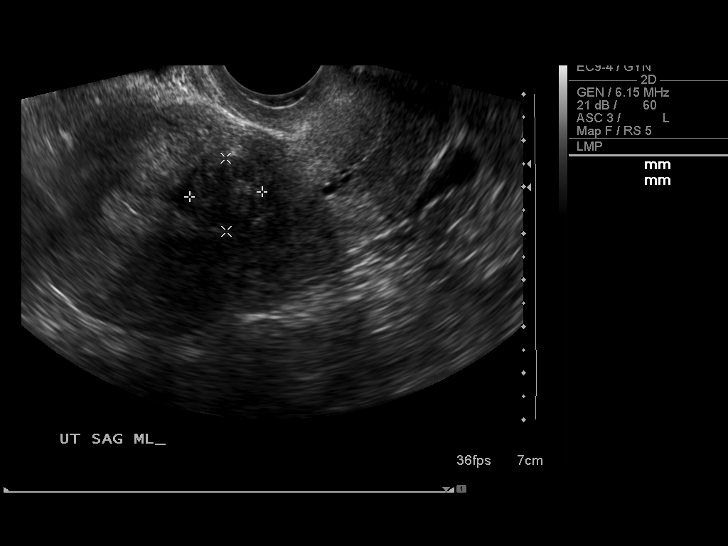
[im 24/38]
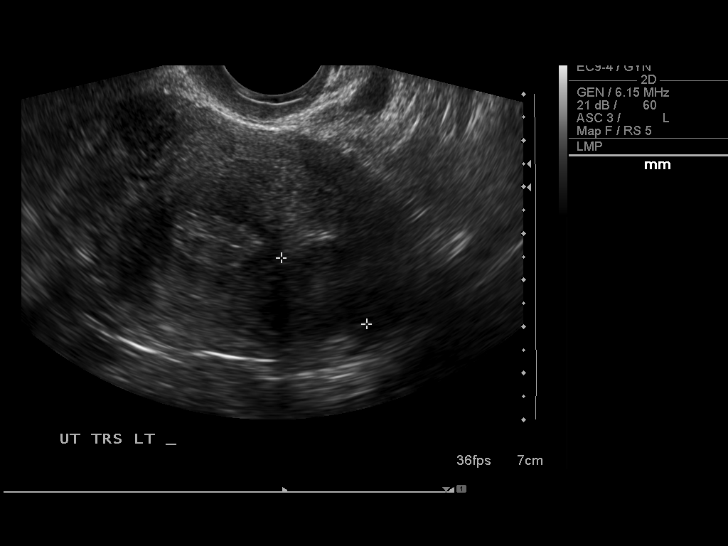
[im 25/38]
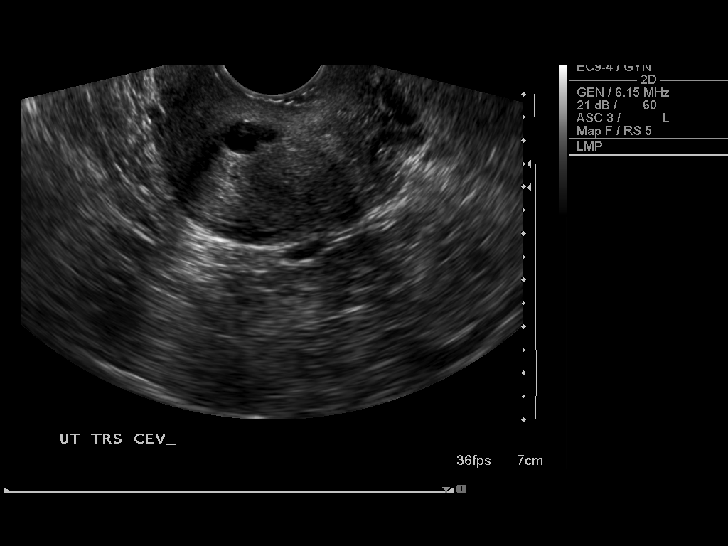
[im 28/38]
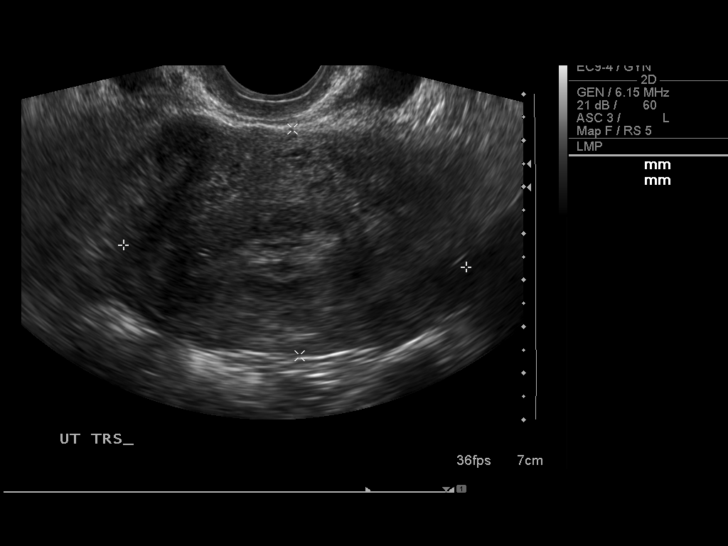
[im 31/38]
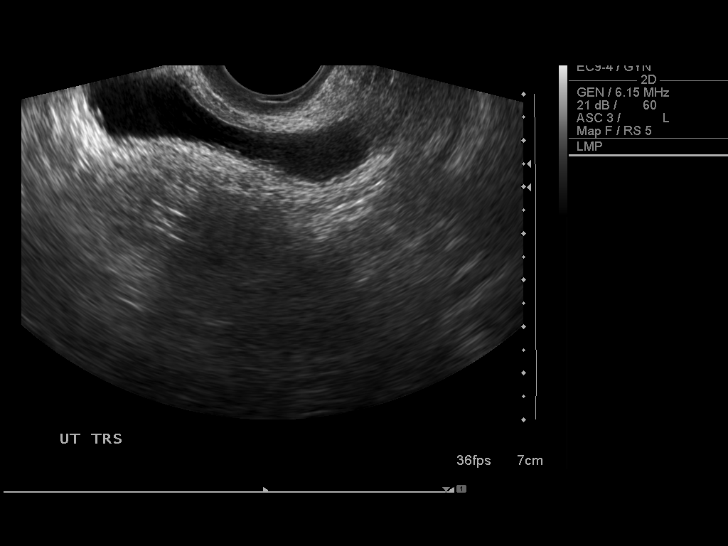
[im 34/38]
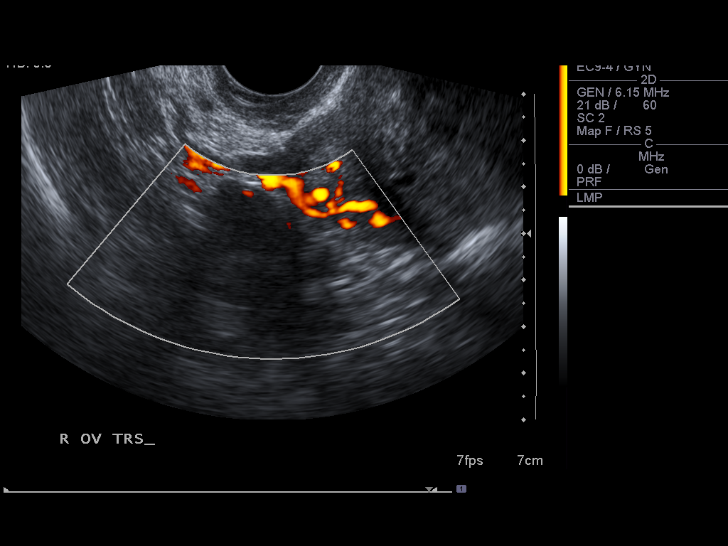
[im 38/38]
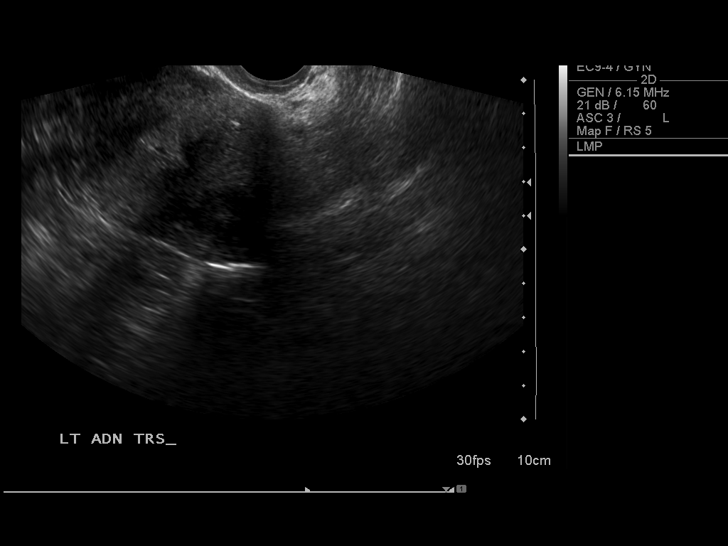

[14 of 25 positions shown; findings below may reference images not displayed]

FINDINGS: Uterus: 4.9 x 7.4 x 8.9 cm, containing a 12 x 16 x 17 mm fibroid in
the right anterior body, a 16 x 17 mm fibroid which is probably
submucosal, and a 21 x 23 x 32 mm fibroid in the posterior left
body.

Endometrium: 9 mm in thickness, unremarkable.

Right ovary:  2 x 2.8 x 3.6 cm, unremarkable.

Left ovary: Not seen on transabdominal or transvaginal scanning.

Other findings: There is a small amount of free pelvic fluid.
IMPRESSION: 1.  Uterine fibroids as previously described.
2.  Unremarkable right ovary.
3.  Left ovary not visualized..

## 2013-06-22 ENCOUNTER — Encounter (HOSPITAL_BASED_OUTPATIENT_CLINIC_OR_DEPARTMENT_OTHER): Payer: Self-pay | Admitting: Emergency Medicine

## 2013-06-22 ENCOUNTER — Emergency Department (HOSPITAL_BASED_OUTPATIENT_CLINIC_OR_DEPARTMENT_OTHER)
Admission: EM | Admit: 2013-06-22 | Discharge: 2013-06-22 | Disposition: A | Discharge status: Home / Self Care | Payer: Medicare Other | Attending: Emergency Medicine | Admitting: Emergency Medicine

## 2013-06-22 DIAGNOSIS — Z79899 Other long term (current) drug therapy: Secondary | ICD-10-CM | POA: Insufficient documentation

## 2013-06-22 DIAGNOSIS — Z862 Personal history of diseases of the blood and blood-forming organs and certain disorders involving the immune mechanism: Secondary | ICD-10-CM | POA: Diagnosis not present

## 2013-06-22 DIAGNOSIS — Z85828 Personal history of other malignant neoplasm of skin: Secondary | ICD-10-CM | POA: Diagnosis not present

## 2013-06-22 DIAGNOSIS — Z8669 Personal history of other diseases of the nervous system and sense organs: Secondary | ICD-10-CM | POA: Diagnosis not present

## 2013-06-22 DIAGNOSIS — Z8544 Personal history of malignant neoplasm of other female genital organs: Secondary | ICD-10-CM | POA: Insufficient documentation

## 2013-06-22 DIAGNOSIS — G4730 Sleep apnea, unspecified: Secondary | ICD-10-CM | POA: Diagnosis not present

## 2013-06-22 DIAGNOSIS — J029 Acute pharyngitis, unspecified: Secondary | ICD-10-CM | POA: Diagnosis not present

## 2013-06-22 DIAGNOSIS — Z8659 Personal history of other mental and behavioral disorders: Secondary | ICD-10-CM | POA: Diagnosis not present

## 2013-06-22 DIAGNOSIS — Z87891 Personal history of nicotine dependence: Secondary | ICD-10-CM | POA: Insufficient documentation

## 2013-06-22 DIAGNOSIS — Z8614 Personal history of Methicillin resistant Staphylococcus aureus infection: Secondary | ICD-10-CM | POA: Diagnosis not present

## 2013-06-22 DIAGNOSIS — J45909 Unspecified asthma, uncomplicated: Secondary | ICD-10-CM | POA: Insufficient documentation

## 2013-06-22 DIAGNOSIS — K219 Gastro-esophageal reflux disease without esophagitis: Secondary | ICD-10-CM | POA: Diagnosis not present

## 2013-06-22 DIAGNOSIS — I10 Essential (primary) hypertension: Secondary | ICD-10-CM | POA: Diagnosis not present

## 2013-06-22 DIAGNOSIS — Z8639 Personal history of other endocrine, nutritional and metabolic disease: Secondary | ICD-10-CM | POA: Insufficient documentation

## 2013-06-22 LAB — CBC WITH DIFFERENTIAL/PLATELET
Basophils Absolute: 0 10*3/uL (ref 0.0–0.1)
Basophils Relative: 1 % (ref 0–1)
Eosinophils Absolute: 0.2 10*3/uL (ref 0.0–0.7)
Eosinophils Relative: 3 % (ref 0–5)
HCT: 39.3 % (ref 36.0–46.0)
Hemoglobin: 12.8 g/dL (ref 12.0–15.0)
Lymphocytes Relative: 25 % (ref 12–46)
Lymphs Abs: 1.7 10*3/uL (ref 0.7–4.0)
MCH: 29.2 pg (ref 26.0–34.0)
MCHC: 32.6 g/dL (ref 30.0–36.0)
MCV: 89.7 fL (ref 78.0–100.0)
Monocytes Absolute: 0.6 10*3/uL (ref 0.1–1.0)
Monocytes Relative: 10 % (ref 3–12)
Neutro Abs: 4 10*3/uL (ref 1.7–7.7)
Neutrophils Relative %: 62 % (ref 43–77)
Platelets: 219 10*3/uL (ref 150–400)
RBC: 4.38 MIL/uL (ref 3.87–5.11)
RDW: 13.8 % (ref 11.5–15.5)
WBC: 6.6 10*3/uL (ref 4.0–10.5)

## 2013-06-22 LAB — BASIC METABOLIC PANEL
BUN: 11 mg/dL (ref 6–23)
CO2: 23 mEq/L (ref 19–32)
Calcium: 9.3 mg/dL (ref 8.4–10.5)
Chloride: 106 mEq/L (ref 96–112)
Creatinine, Ser: 0.6 mg/dL (ref 0.50–1.10)
GFR calc Af Amer: >90 mL/min (ref >90)
GFR calc non Af Amer: >90 mL/min (ref >90)
Glucose, Bld: 99 mg/dL (ref 70–99)
Potassium: 3.4 mEq/L — ABNORMAL LOW (ref 3.5–5.1)
Sodium: 142 mEq/L (ref 135–145)

## 2013-06-22 LAB — RAPID STREP SCREEN (MED CTR MEBANE ONLY): Streptococcus, Group A Screen (Direct): NEGATIVE

## 2013-06-22 IMAGING — CR DG LUMBAR SPINE COMPLETE 4+V
5 series · 5 of 5 positions shown · non-contrast
Comparison: None.

CLINICAL DATA: Low back pain with right hip and groin pain

LUMBAR SPINE - COMPLETE 4+ VIEW

[view not recorded (1 of 5)]
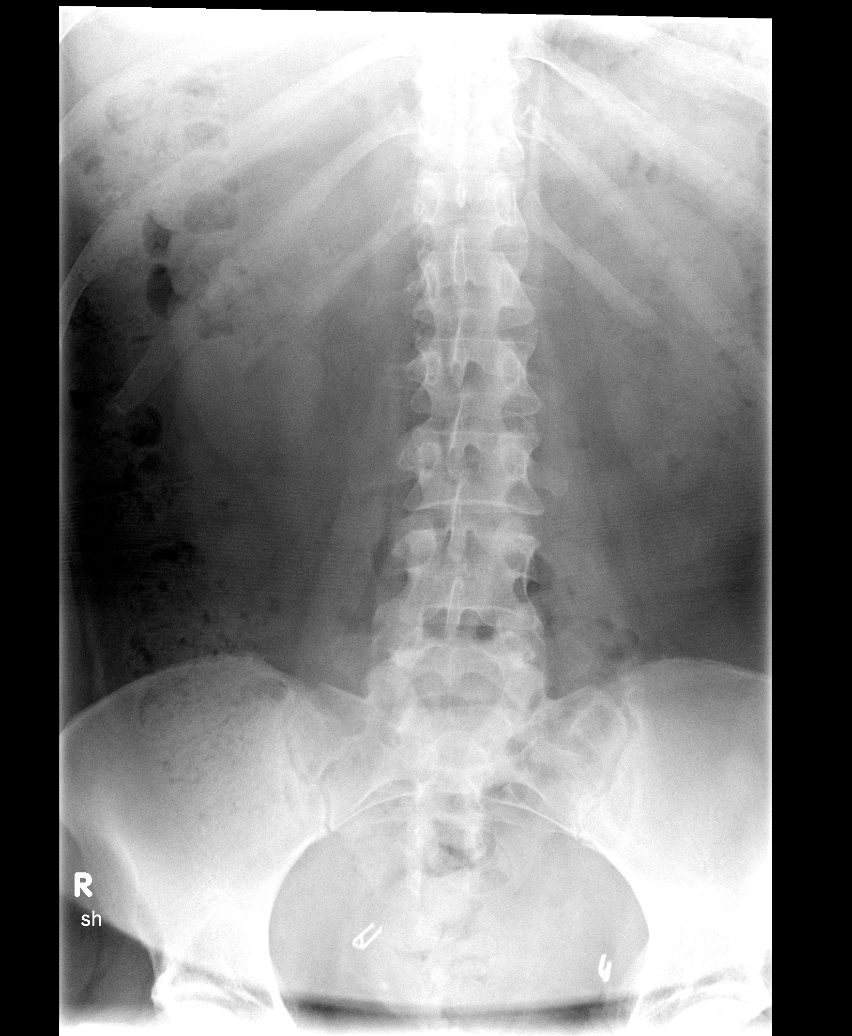

[view not recorded (2 of 5)]
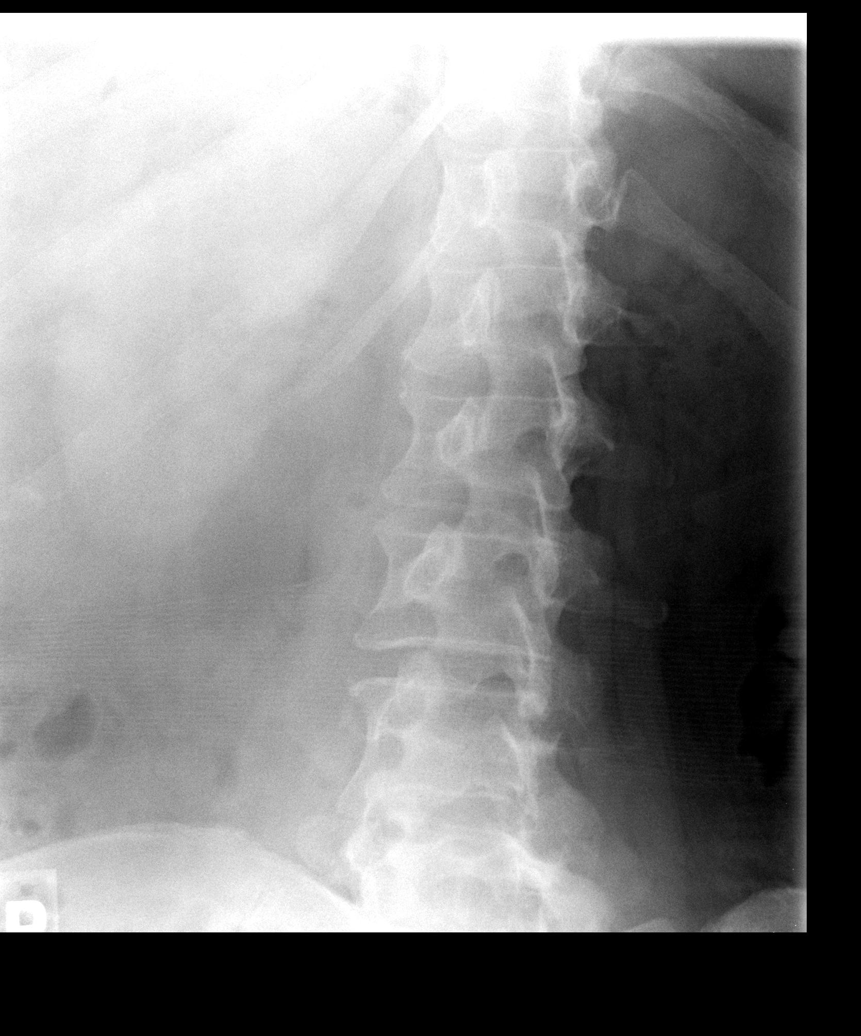

[view not recorded (3 of 5)]
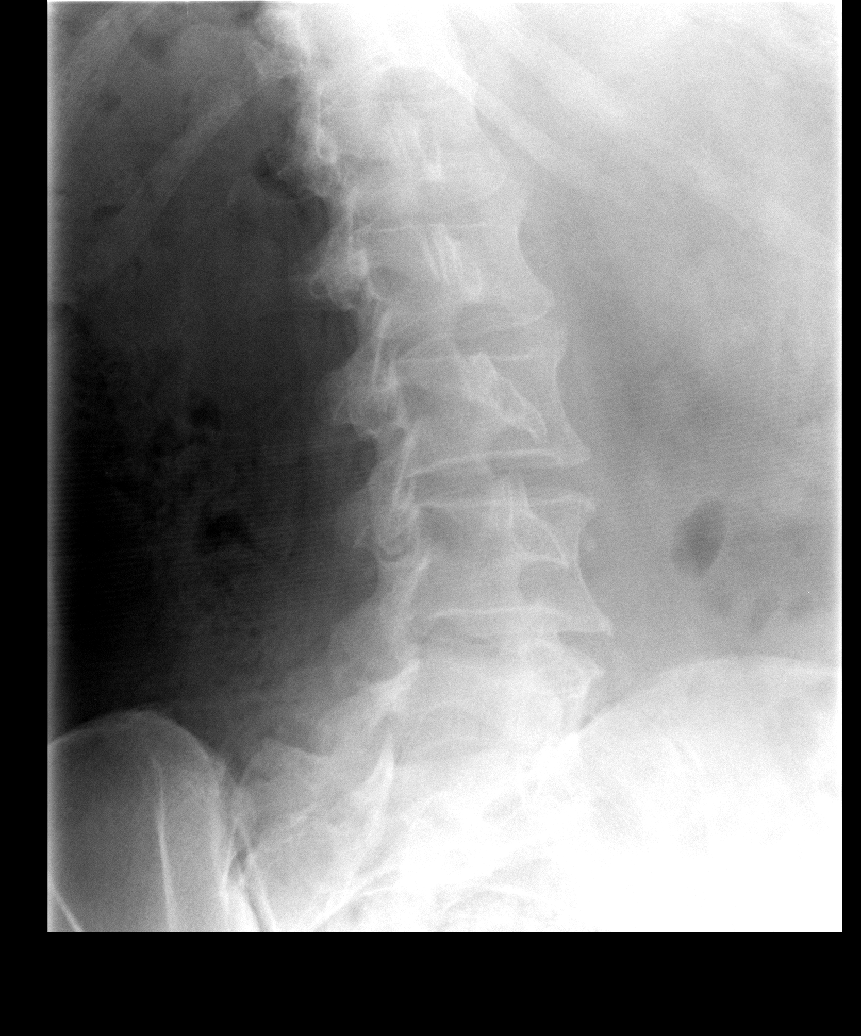

[view not recorded (4 of 5)]
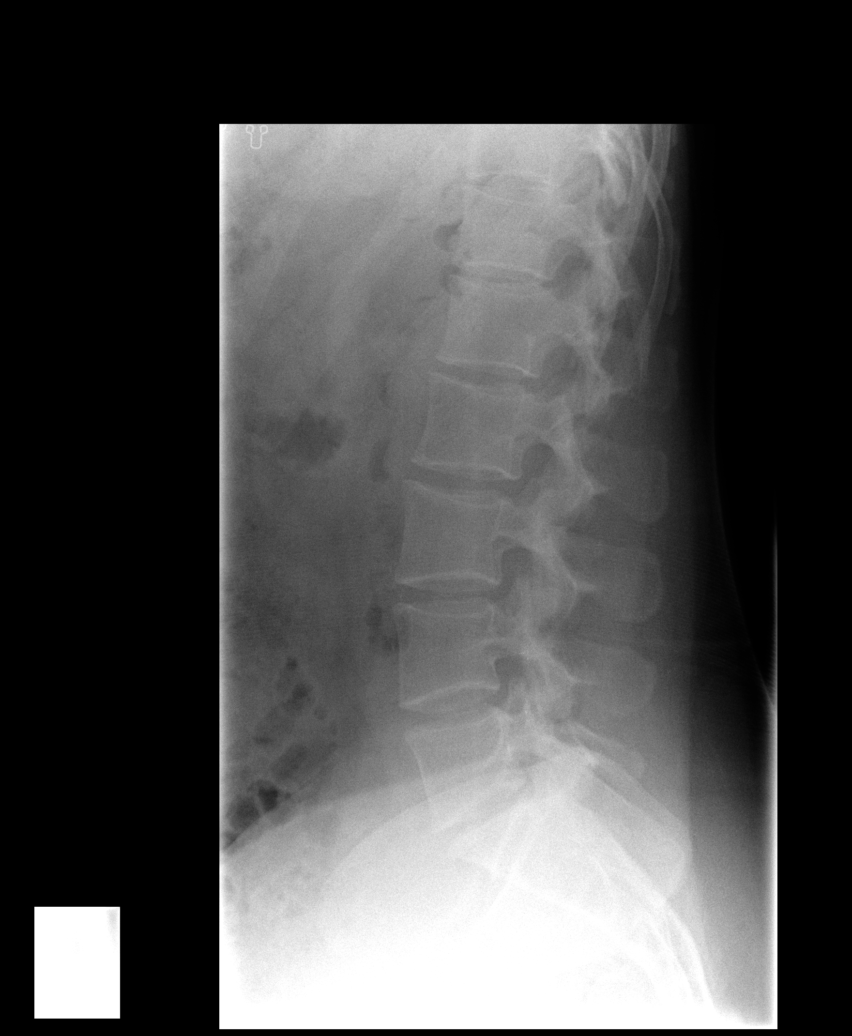

[view not recorded (5 of 5)]
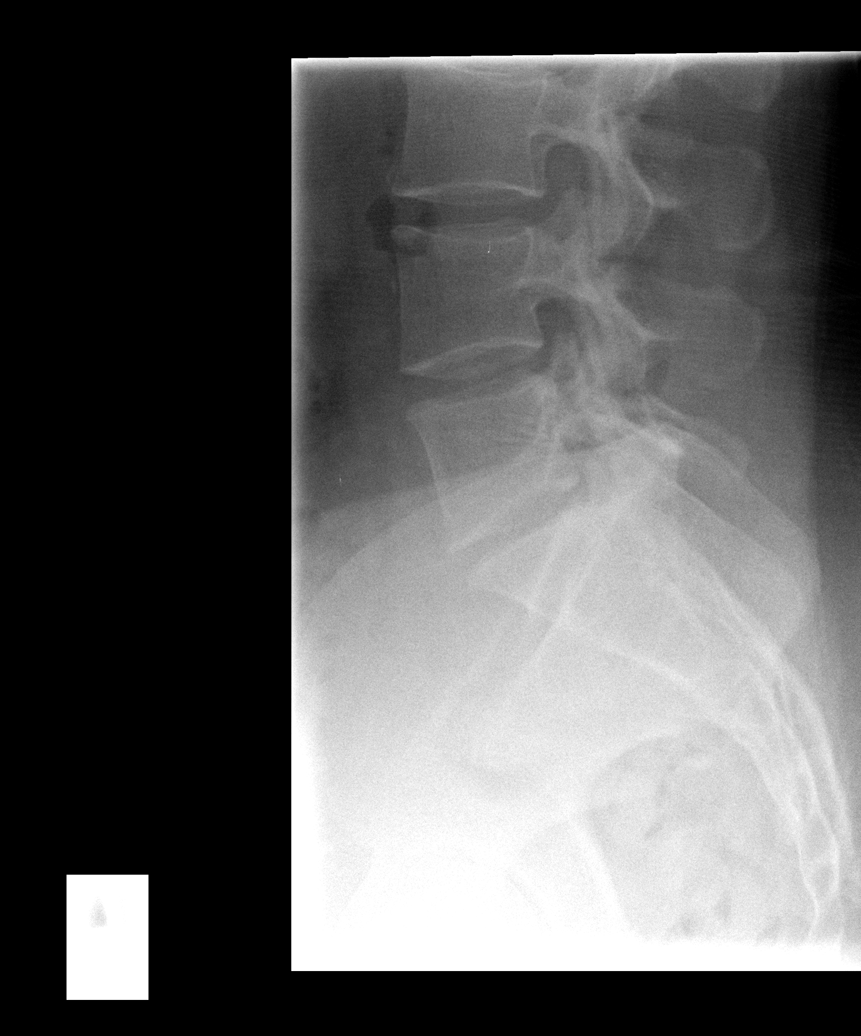

[5 of 5 positions shown; findings below may reference images not displayed]

FINDINGS: Negative for fracture or mass.  Normal alignment.  Mild
disc space narrowing L5-S1.  Remaining disc spaces are intact.
Bilateral fallopian tube clips are noted.
IMPRESSION: Mild disc space narrowing L5 S1.  Otherwise negative

## 2013-06-22 MED ORDER — DEXAMETHASONE 4 MG PO TABS: 12.0000 mg | ORAL_TABLET | Freq: Once | ORAL | Status: DC
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
## 2013-06-22 NOTE — ED Notes (Signed)
Patient has a myriad of concerns tonight.  Describes a procedure on Wednesday by pulmonology to inspect her posterior pharynx and swallowing capabilities.  Verbalizes 'being dry' because she is having difficulty swallowing liquids.  Sticks her tongue out and voices concerns that one side of her tongue curls higher than the opposite side.  Worried that she might have strep throat because her granddaughter sneezed on her.  Reports no one did labs on her to rule that out.

## 2013-06-22 NOTE — Discharge Instructions (Signed)
Your tests today were normal including normal white blood cell count, normal hemoglobin, normal blood sugar, normal electrolytes, and negative rapid strep screen. Please keep your scheduled appointment with your PCP in 2 days. He is followup with ENT at [facility] for further evaluation.

## 2013-06-23 DIAGNOSIS — R079 Chest pain, unspecified: Secondary | ICD-10-CM | POA: Diagnosis not present

## 2013-06-23 DIAGNOSIS — I471 Supraventricular tachycardia: Secondary | ICD-10-CM | POA: Diagnosis not present

## 2013-06-23 DIAGNOSIS — E785 Hyperlipidemia, unspecified: Secondary | ICD-10-CM | POA: Diagnosis not present

## 2013-06-23 DIAGNOSIS — Z87891 Personal history of nicotine dependence: Secondary | ICD-10-CM | POA: Diagnosis not present

## 2013-06-23 DIAGNOSIS — K219 Gastro-esophageal reflux disease without esophagitis: Secondary | ICD-10-CM | POA: Diagnosis not present

## 2013-06-23 DIAGNOSIS — Z8542 Personal history of malignant neoplasm of other parts of uterus: Secondary | ICD-10-CM | POA: Diagnosis not present

## 2013-06-23 DIAGNOSIS — Z882 Allergy status to sulfonamides status: Secondary | ICD-10-CM | POA: Diagnosis not present

## 2013-06-23 DIAGNOSIS — F339 Major depressive disorder, recurrent, unspecified: Secondary | ICD-10-CM | POA: Diagnosis not present

## 2013-06-23 DIAGNOSIS — Z883 Allergy status to other anti-infective agents status: Secondary | ICD-10-CM | POA: Diagnosis not present

## 2013-06-23 DIAGNOSIS — Z886 Allergy status to analgesic agent status: Secondary | ICD-10-CM | POA: Diagnosis not present

## 2013-06-23 DIAGNOSIS — J45909 Unspecified asthma, uncomplicated: Secondary | ICD-10-CM | POA: Diagnosis not present

## 2013-06-23 DIAGNOSIS — G40909 Epilepsy, unspecified, not intractable, without status epilepticus: Secondary | ICD-10-CM | POA: Diagnosis not present

## 2013-06-23 DIAGNOSIS — Z888 Allergy status to other drugs, medicaments and biological substances status: Secondary | ICD-10-CM | POA: Diagnosis not present

## 2013-06-23 DIAGNOSIS — Z79899 Other long term (current) drug therapy: Secondary | ICD-10-CM | POA: Diagnosis not present

## 2013-06-23 DIAGNOSIS — I10 Essential (primary) hypertension: Secondary | ICD-10-CM | POA: Diagnosis not present

## 2013-06-23 DIAGNOSIS — R0609 Other forms of dyspnea: Secondary | ICD-10-CM | POA: Diagnosis not present

## 2013-06-23 DIAGNOSIS — Z885 Allergy status to narcotic agent status: Secondary | ICD-10-CM | POA: Diagnosis not present

## 2013-06-23 DIAGNOSIS — R0602 Shortness of breath: Secondary | ICD-10-CM | POA: Diagnosis not present

## 2013-06-24 ENCOUNTER — Encounter: Payer: Self-pay | Admitting: Family Medicine

## 2013-06-24 ENCOUNTER — Ambulatory Visit (INDEPENDENT_AMBULATORY_CARE_PROVIDER_SITE_OTHER): Payer: Medicare Other | Admitting: Family Medicine

## 2013-06-24 VITALS — BP 154/97 | HR 104 | Wt 185.0 lb

## 2013-06-24 DIAGNOSIS — K5900 Constipation, unspecified: Secondary | ICD-10-CM

## 2013-06-24 DIAGNOSIS — T783XXA Angioneurotic edema, initial encounter: Secondary | ICD-10-CM | POA: Diagnosis not present

## 2013-06-24 DIAGNOSIS — T783XXD Angioneurotic edema, subsequent encounter: Secondary | ICD-10-CM

## 2013-06-24 DIAGNOSIS — I10 Essential (primary) hypertension: Secondary | ICD-10-CM | POA: Diagnosis not present

## 2013-06-24 DIAGNOSIS — R140 Abdominal distension (gaseous): Secondary | ICD-10-CM

## 2013-06-24 DIAGNOSIS — Z5189 Encounter for other specified aftercare: Secondary | ICD-10-CM | POA: Diagnosis not present

## 2013-06-24 DIAGNOSIS — R141 Gas pain: Secondary | ICD-10-CM | POA: Diagnosis not present

## 2013-06-24 LAB — CULTURE, GROUP A STREP

## 2013-06-24 MED ORDER — HYDRALAZINE HCL 25 MG PO TABS: 25.0000 mg | ORAL_TABLET | Freq: Three times a day (TID) | ORAL | Status: DC

## 2013-06-24 NOTE — Telephone Encounter (Signed)
Please call patient: See if she is willing to try hydralazine for her blood pressure. 3 short acting. It can be taken 2-3 times a day.

## 2013-06-24 NOTE — Progress Notes (Signed)
Subjective:    Patient ID: Jennifer [name], female    DOB: 1966/07/23, 47 y.o.   MRN: [id number]  HPI HTN - not taking her amlodpine right now.  She still feels like it causes her heart to race and blood clot but she says she still gets the symptoms even when she doesn't take it. She feels like her heart rates been running a lot higher on average that has the last couple years. She said this happened years ago. She's also feeling is that that sensation in her chest. She wants to try a different blood pressure medication but has tried multiples in the past. Beta blockers can cause for chest pain/squeezing sensation. She did get a cough with him the ACE inhibitors and after the angioedema has been warned to stay away from ACE inhibitors. She says the clonidine patch actually elevated her blood pressure in the past. She has recently cut back on caffeine intake and chocolate. She has been drinking soda again.  She has been having a lot problems with her throat.  She hs been to the ED a few times for this in the last week. Felt like her tongue was swollen and sore recenlty too. Has vomited a couple of times.  She is to be scheduled fo another swallow study.  Did see pulmonology but they felt she likely doesn' thave true asthma.  He did feel that her tongue did look a little swollen there.  He gave her thing to look for on exam.  Later that day went to ED at [facility] and was given epinephrine and benadryl.  The swelling got better and had a little rash around her mouth.  Does have an Epi-pen.  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 She hasn't gone for the labs yet.  She is not sure how to use the Epi-pen.  She says overall she does feel better today that her tongue feels still a little off but much better than it was yesterday.  Has been more bloated and nauseted. Has been eating more soy.  Has been passing more gas.  Bowels have been slow.  She has been avoiding milk products because she came back borderline  for allergy for milk. She does notice that she gets much more mucus production in her throat when she eats milk products which tends to aggravate her throat issues. This is not uncommon for people who do not have milk allergy.   Review of Systems     Objective:   Physical Exam  Constitutional: She is oriented to person, place, and time. She appears well-developed and well-nourished.  HENT:  Head: Normocephalic and atraumatic.  Right Ear: External ear normal.  Left Ear: External ear normal.  Nose: Nose normal.  Mouth/Throat: Oropharynx is clear and moist.  TMs and canals are clear. Tongue is not swollen on exam today.  Eyes: Conjunctivae and EOM are normal. Pupils are equal, round, and reactive to light.  Neck: Neck supple. No thyromegaly present.  Cardiovascular: Normal rate, regular rhythm and normal heart sounds.   Pulmonary/Chest: Effort normal and breath sounds normal. She has no wheezes.  Lymphadenopathy:    She has no cervical adenopathy.  Neurological: She is alert and oriented to person, place, and time.  Skin: Skin is warm and dry.  Psychiatric: She has a normal mood and affect.          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 Reviewed how to use it and  to inject into the thigh. She can use the top of the thigh or the side of the thigh. Either way different matter. Also encouraged her call and schedule her allergist. They think it would be important to get her allergist involved. I'm not sure what triggered this. I did encourage her to get the blood work to check for the C1 esterase deficiency  HTN - uncontrolled. Will stop the amlopdine. I'm still not her present conditions that the amlodipine is the cause of her symptoms. I really think she's having symptoms even when she doesn't take the medication and had tried to reassure her that on multiple occasions that she is convinced that aggravates her symptoms. Will try hydralazine.    Constipation-recommend using a stool softeners twice a day as needed. Very gentle doesn't cause stomach cramps. She's having some bowel movements but they are small and feels like she is not completely voiding. If using a stool softener for couple days is not help her in symptoms and she can certainly try glycerin suppository if needed.  Bloating-I. think a probiotic would actually be helpful for her. She says she's tried in the past and isn't sure that they really were couple are not and they're quite expensive. I still think this might be worth trying it would likely not be harmful. But he may be secondary to recent changes in her diet. She's tried to increase her fiber and has switched from milk to soy products.

## 2013-06-24 NOTE — Patient Instructions (Addendum)
Recommend a stool softener to get bowels going. Can take twice a day with food.   Consider a probiotic to help with the gas.   Recommend you see your allergist.

## 2013-06-25 ENCOUNTER — Encounter (HOSPITAL_BASED_OUTPATIENT_CLINIC_OR_DEPARTMENT_OTHER): Payer: Self-pay | Admitting: Emergency Medicine

## 2013-06-25 ENCOUNTER — Emergency Department (HOSPITAL_BASED_OUTPATIENT_CLINIC_OR_DEPARTMENT_OTHER)
Admission: EM | Admit: 2013-06-25 | Discharge: 2013-06-25 | Disposition: A | Discharge status: Home / Self Care | Payer: Medicare Other | Attending: Emergency Medicine | Admitting: Emergency Medicine

## 2013-06-25 ENCOUNTER — Ambulatory Visit: Payer: Medicare Other | Admitting: Family Medicine

## 2013-06-25 ENCOUNTER — Telehealth: Payer: Self-pay

## 2013-06-25 DIAGNOSIS — J309 Allergic rhinitis, unspecified: Secondary | ICD-10-CM | POA: Diagnosis not present

## 2013-06-25 DIAGNOSIS — Z8544 Personal history of malignant neoplasm of other female genital organs: Secondary | ICD-10-CM | POA: Insufficient documentation

## 2013-06-25 DIAGNOSIS — K219 Gastro-esophageal reflux disease without esophagitis: Secondary | ICD-10-CM | POA: Insufficient documentation

## 2013-06-25 DIAGNOSIS — Z880 Allergy status to penicillin: Secondary | ICD-10-CM | POA: Diagnosis not present

## 2013-06-25 DIAGNOSIS — I10 Essential (primary) hypertension: Secondary | ICD-10-CM | POA: Insufficient documentation

## 2013-06-25 DIAGNOSIS — Z8559 Personal history of malignant neoplasm of other urinary tract organ: Secondary | ICD-10-CM | POA: Insufficient documentation

## 2013-06-25 DIAGNOSIS — R220 Localized swelling, mass and lump, head: Secondary | ICD-10-CM | POA: Insufficient documentation

## 2013-06-25 DIAGNOSIS — Z79899 Other long term (current) drug therapy: Secondary | ICD-10-CM | POA: Diagnosis not present

## 2013-06-25 DIAGNOSIS — Z8659 Personal history of other mental and behavioral disorders: Secondary | ICD-10-CM | POA: Diagnosis not present

## 2013-06-25 DIAGNOSIS — J45909 Unspecified asthma, uncomplicated: Secondary | ICD-10-CM | POA: Diagnosis not present

## 2013-06-25 DIAGNOSIS — T450X5A Adverse effect of antiallergic and antiemetic drugs, initial encounter: Secondary | ICD-10-CM | POA: Insufficient documentation

## 2013-06-25 DIAGNOSIS — E860 Dehydration: Secondary | ICD-10-CM | POA: Diagnosis not present

## 2013-06-25 DIAGNOSIS — Z8619 Personal history of other infectious and parasitic diseases: Secondary | ICD-10-CM | POA: Diagnosis not present

## 2013-06-25 DIAGNOSIS — IMO0002 Reserved for concepts with insufficient information to code with codable children: Secondary | ICD-10-CM | POA: Diagnosis not present

## 2013-06-25 DIAGNOSIS — Z8739 Personal history of other diseases of the musculoskeletal system and connective tissue: Secondary | ICD-10-CM | POA: Insufficient documentation

## 2013-06-25 DIAGNOSIS — T50905A Adverse effect of unspecified drugs, medicaments and biological substances, initial encounter: Secondary | ICD-10-CM

## 2013-06-25 DIAGNOSIS — Z8614 Personal history of Methicillin resistant Staphylococcus aureus infection: Secondary | ICD-10-CM | POA: Diagnosis not present

## 2013-06-25 DIAGNOSIS — Z87891 Personal history of nicotine dependence: Secondary | ICD-10-CM | POA: Insufficient documentation

## 2013-06-25 DIAGNOSIS — T7800XA Anaphylactic reaction due to unspecified food, initial encounter: Secondary | ICD-10-CM | POA: Diagnosis not present

## 2013-06-25 MED ORDER — SODIUM CHLORIDE 0.9 % IV BOLUS (SEPSIS)
1000.0000 mL | Freq: Once | INTRAVENOUS | Status: AC
  Administered 2013-06-25: 1000 mL via INTRAVENOUS

## 2013-06-25 NOTE — ED Notes (Signed)
Pt co cough and sore throat x 2 days.
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
## 2013-06-25 NOTE — ED Provider Notes (Signed)
Medical screening examination/treatment/procedure(s) were performed by non-physician practitioner and as supervising physician I was immediately available for consultation/collaboration.  EKG Interpretation   None         Charles B. Sheldon, MD 06/25/13 1908 

## 2013-06-25 NOTE — Telephone Encounter (Signed)
Patient states the tests will not be back for a week. She wants to know which blood pressure medication is she going to take?

## 2013-06-25 NOTE — Telephone Encounter (Signed)
Sent over rx for hydralazine.

## 2013-06-26 ENCOUNTER — Encounter: Payer: Self-pay | Admitting: Gynecologic Oncology

## 2013-06-26 ENCOUNTER — Ambulatory Visit: Payer: Medicare Other | Attending: Gynecologic Oncology | Admitting: Gynecologic Oncology

## 2013-06-26 VITALS — BP 136/80 | HR 80 | Temp 98.4°F | Resp 16 | Ht 62.0 in | Wt 186.0 lb

## 2013-06-26 DIAGNOSIS — K219 Gastro-esophageal reflux disease without esophagitis: Secondary | ICD-10-CM | POA: Insufficient documentation

## 2013-06-26 DIAGNOSIS — K5900 Constipation, unspecified: Secondary | ICD-10-CM | POA: Diagnosis not present

## 2013-06-26 DIAGNOSIS — Z79899 Other long term (current) drug therapy: Secondary | ICD-10-CM | POA: Insufficient documentation

## 2013-06-26 DIAGNOSIS — R141 Gas pain: Secondary | ICD-10-CM | POA: Diagnosis not present

## 2013-06-26 DIAGNOSIS — R109 Unspecified abdominal pain: Secondary | ICD-10-CM | POA: Diagnosis not present

## 2013-06-26 DIAGNOSIS — N946 Dysmenorrhea, unspecified: Secondary | ICD-10-CM | POA: Diagnosis not present

## 2013-06-26 DIAGNOSIS — Z87891 Personal history of nicotine dependence: Secondary | ICD-10-CM | POA: Diagnosis not present

## 2013-06-26 DIAGNOSIS — Z803 Family history of malignant neoplasm of breast: Secondary | ICD-10-CM | POA: Insufficient documentation

## 2013-06-26 DIAGNOSIS — I10 Essential (primary) hypertension: Secondary | ICD-10-CM | POA: Diagnosis not present

## 2013-06-26 DIAGNOSIS — C549 Malignant neoplasm of corpus uteri, unspecified: Secondary | ICD-10-CM | POA: Insufficient documentation

## 2013-06-26 DIAGNOSIS — R142 Eructation: Secondary | ICD-10-CM | POA: Insufficient documentation

## 2013-06-26 DIAGNOSIS — K6289 Other specified diseases of anus and rectum: Secondary | ICD-10-CM | POA: Diagnosis not present

## 2013-06-26 DIAGNOSIS — R1310 Dysphagia, unspecified: Secondary | ICD-10-CM | POA: Diagnosis not present

## 2013-06-26 DIAGNOSIS — C541 Malignant neoplasm of endometrium: Secondary | ICD-10-CM

## 2013-06-26 DIAGNOSIS — K589 Irritable bowel syndrome without diarrhea: Secondary | ICD-10-CM | POA: Diagnosis not present

## 2013-06-26 IMAGING — CR DG ABDOMEN ACUTE W/ 1V CHEST
3 series · 3 of 3 positions shown · non-contrast
Comparison: 11/24/2011.

CLINICAL DATA: 46-year-old female with abdominal pain nausea
vomiting diarrhea.

ACUTE ABDOMEN SERIES (ABDOMEN 2 VIEW & CHEST 1 VIEW)

[w chest pa]
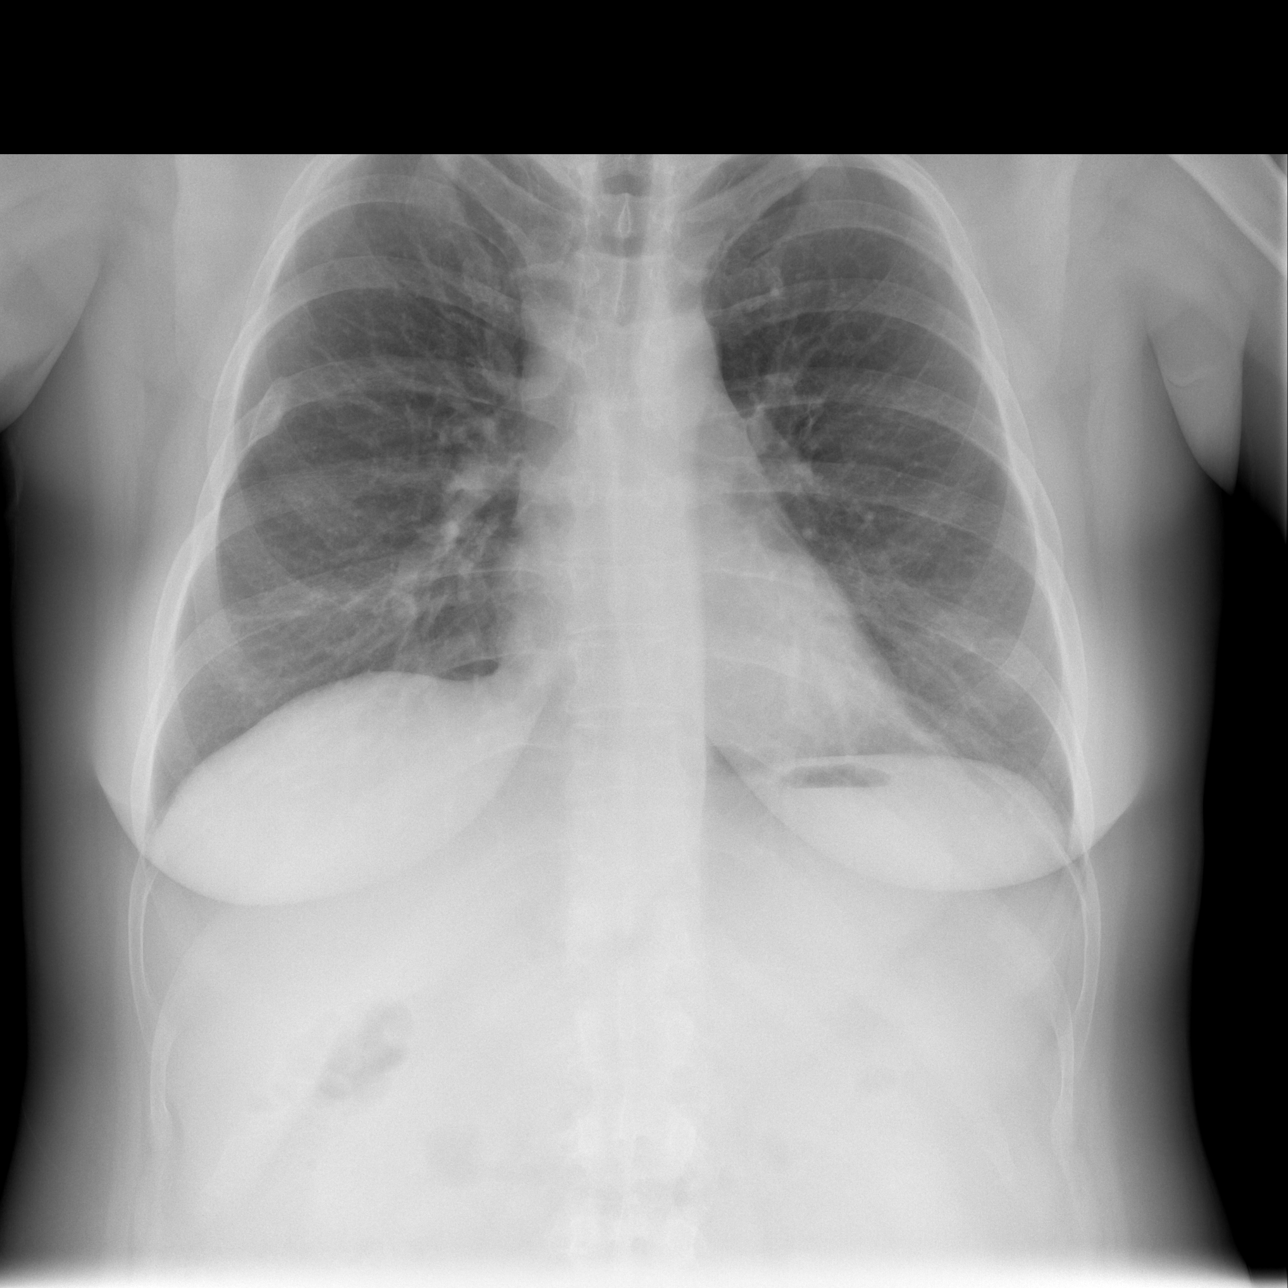

[w abdomen upright]
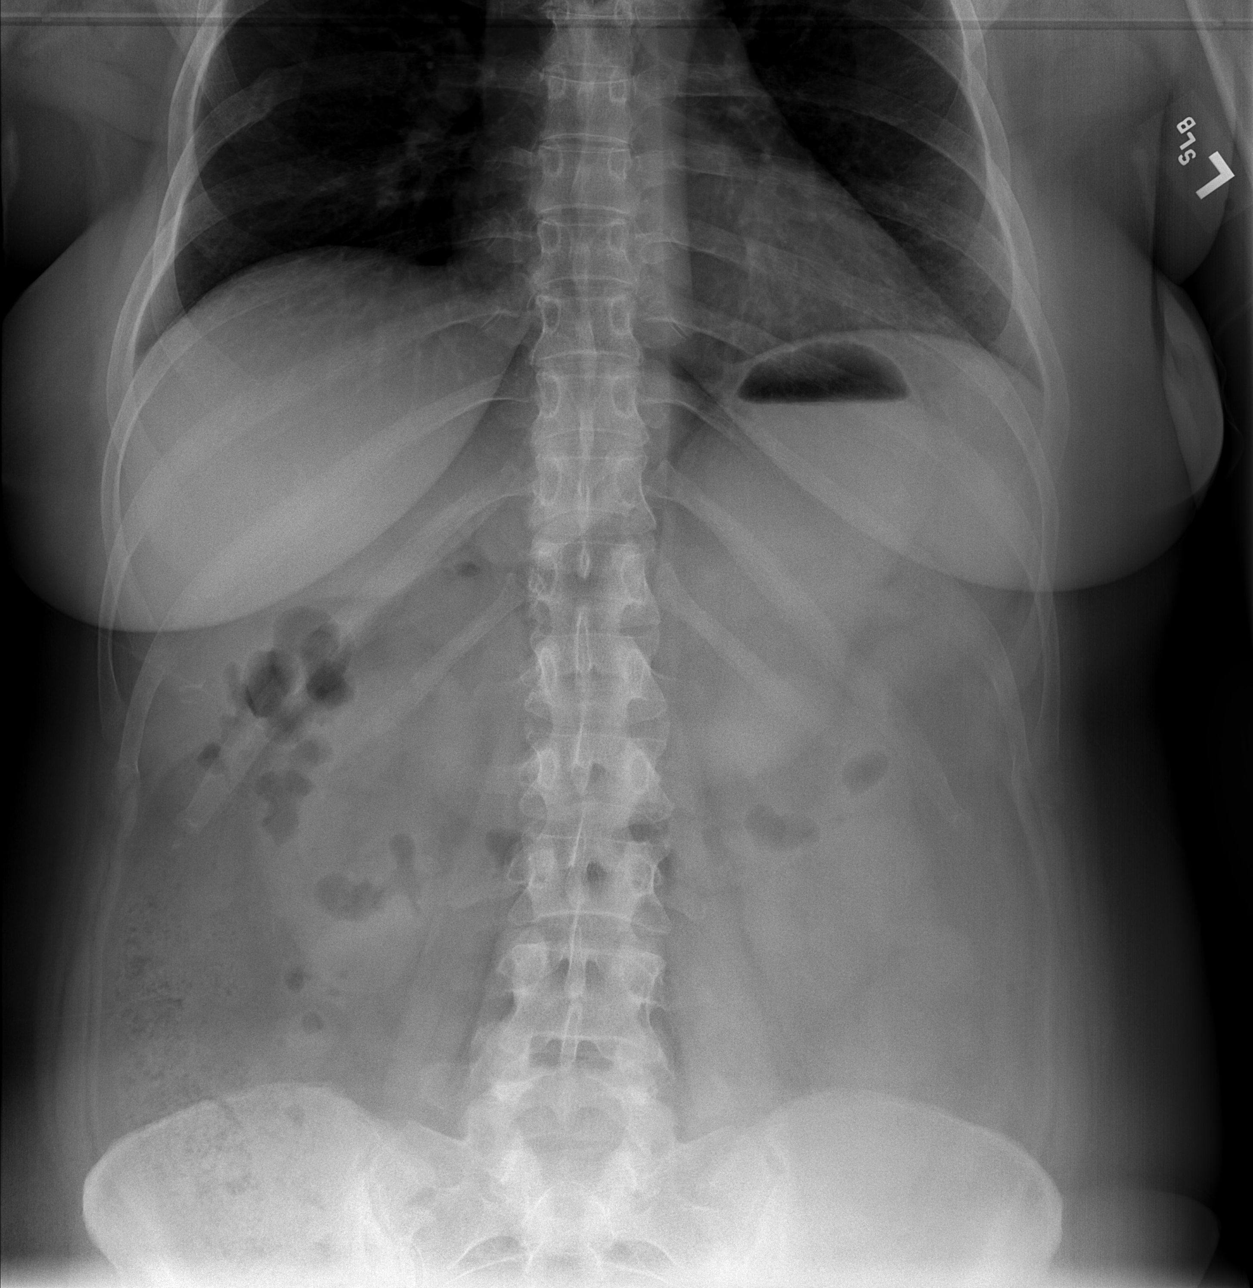

[t abdomen supine]
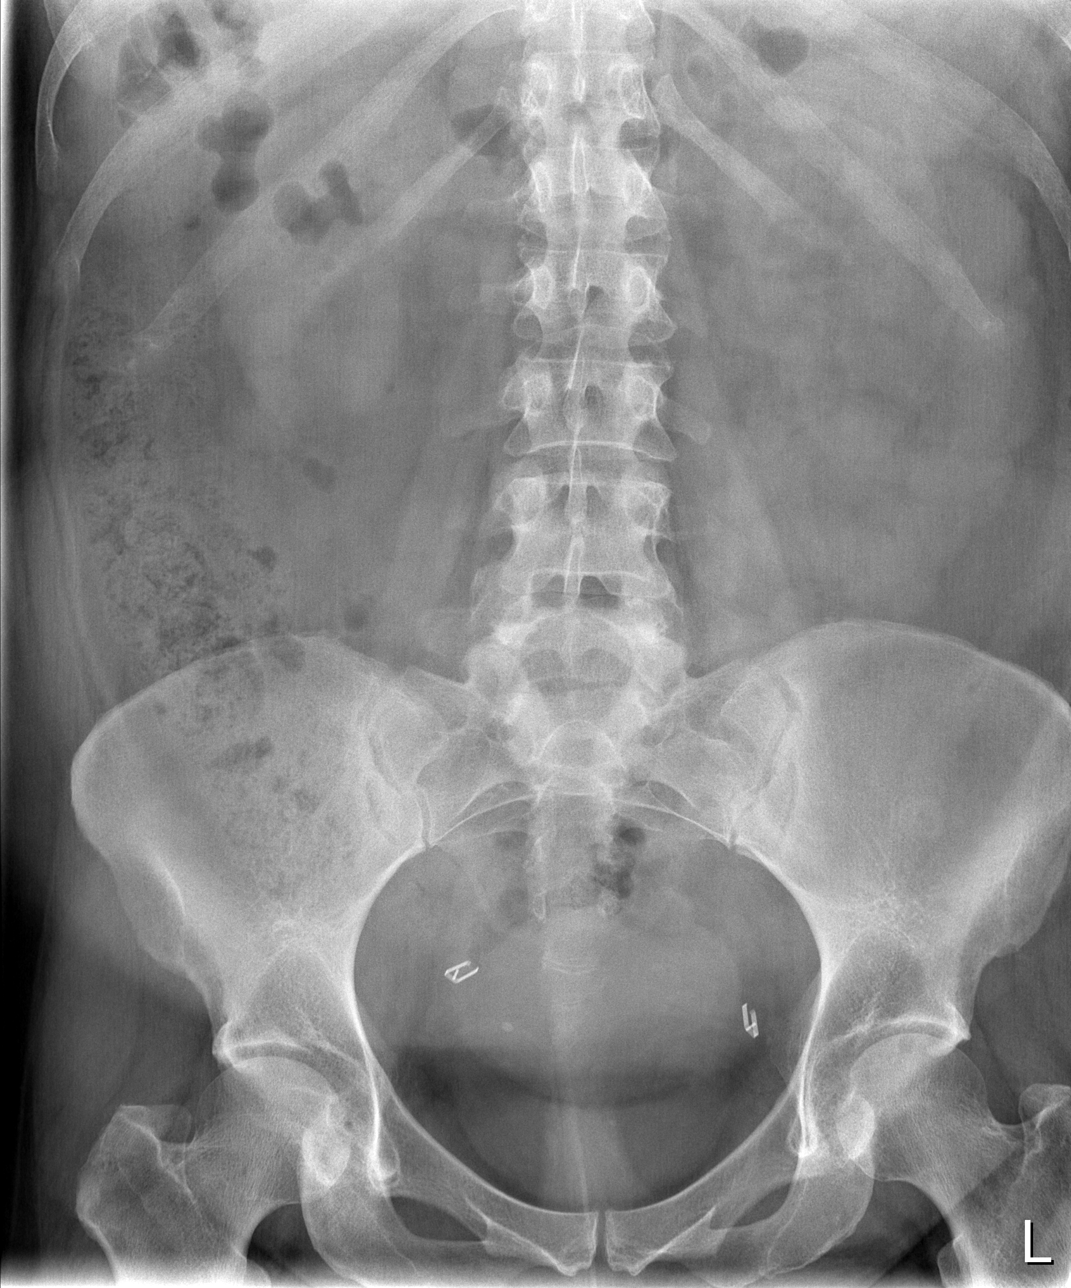

[3 of 3 positions shown; findings below may reference images not displayed]

FINDINGS: Minimal streaky opacity at the lung bases most resembles
atelectasis.  No pneumothorax or pneumoperitoneum.  Chronic right
lateral sixth rib fracture.  Cardiac size and mediastinal contours
are within normal limits.

Nonobstructed bowel gas pattern, no free air.  Tubal ligation clips
again noted. No acute osseous abnormality identified.
IMPRESSION: 1. Nonobstructed bowel gas pattern, no free air.
2. Atelectasis, otherwise no acute cardiopulmonary abnormality.
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
## 2013-06-26 NOTE — Telephone Encounter (Signed)
Patient advised.

## 2013-06-26 NOTE — Patient Instructions (Signed)
Uterine Cancer Uterine cancer is an abnormal growth of tissue (tumor) in the uterus that is cancerous (malignant). Unlike noncancerous (benign) tumors, malignant tumors can spread to other parts of your body. The wall of the uterus has two layers of tissue. The inner layer is the endometrium. The outer layer of muscle tissue is the myometrium. The most common type of uterine cancer begins in the endometrium. This is called endometrial cancer. Cancer that begins in the myometrium is called uterine sarcoma, which is very rare.  RISK FACTORS  Although the exact cause of uterine cancer is unknown, there are a number of risk factors that can increase your chances of getting uterine cancer. They include:  Your age. Uterine cancer occurs mostly in women older than 50 years.   Having an enlarged endometrium (endometrial hyperplasia).   Using hormone therapy.   Obesity.   Taking the drug tamoxifen.   White race.   Infertility.   Never being pregnant.   Beginning menstrual periods at an age younger than 12 years.   Having menstrual periods at an age older than 52 years.   Personal history of ovarian, intestinal, or colorectal cancer.   Having a family history of uterine cancer.   Having a family history of hereditary nonpolyposis colon cancer (HNPCC).   Having diabetes, high blood pressure, thyroid disease, or gallbladder disease.   Long-term use of high-dose birth control pills.   Exposure to radiation.   Smoking.  SIGNS AND SYMPTOMS   Abnormal vaginal bleeding or discharge. Bleeding may start as a watery, blood-streaked flow that gradually contains more blood.   Any vaginal bleeding after menopause.   Difficult or painful urination.   Pain during intercourse.   Pain in the pelvic area.  Mass in the vagina.  Pain or fullness in the abdomen.  Frequent urination.  Bleeding between periods.  Growth of the stomach.   Unexplained weight loss.   Uterine cancer usually occurs after menopause. However, it may also occur around the time that menopause begins. Abnormal vaginal bleeding is the most common symptom of uterine cancer. Women should not assume that abnormal vaginal bleeding is part of menopause. DIAGNOSIS  Your health care provider will ask about your medical history. He or she may also perform a number of procedures, such as:  A physical and pelvic exam. Your health care provider will feel your pelvis for any lumps.   Blood and urine tests.   X-rays.   Imaging tests, such as CT scans, ultrasonography, or MRIs.   A hysteroscopy to view the inside of your uterus.   A Pap test to sample cells from the cervix and upper vagina to check for abnormal cells.   Taking a tissue sample (biopsy) from the uterine lining to look for cancer cells.   A dilation and curettage (D&C). This involves stretching (dilation) the cervix and scraping (curettage) the inside lining of the uterus to get a tissue sample. The sample is examined under a microscope to look for cancer cells.  Your cancer will be staged to determine its severity and extent. Staging is a careful attempt to find out the size of the tumor, whether the cancer has spread, and if so, to what parts of the body. You may need to have more tests to determine the stage of your cancer. The test results will help determine what treatment plan is best for you. Cancer stages include:   Stage I The cancer is only found in the uterus.  Stage II The   cancer has spread to the cervix.  Stage III The cancer has spread outside the uterus, but not outside the pelvis. The cancer may have spread to the lymph nodes in the pelvis.  Stage IV The cancer has spread to other parts of the body, such as the bladder or rectum. TREATMENT  Most women with uterine cancer are treated with surgery. This includes removing the uterus, cervix, fallopian tubes, and ovaries (total hysterectomy). Your  lymph nodes near the tumor may also be removed. Some women have radiation, chemotherapy, or hormonal therapy. Other women have a combination of these therapies. HOME CARE INSTRUCTIONS   Only take over-the-counter or prescription medicines as directed by your health care provider.   Maintain a healthy diet.  Exercise regularly.   If you have diabetes, high blood pressure, thyroid disease, or gallbladder disease, follow your health care provider's instructions to keep it under control.   Do not smoke.   Consider joining a support group. This may help you learn to cope with the stress of having uterine cancer.   Seek advice to help you manage treatment side effects.   Keep all follow-up appointments as directed by your health care provider.  SEEK MEDICAL CARE IF:  You have increased stomach or pelvic pain.  You cannot urinate.  You have abnormal bleeding. Document Released: 07/31/2005 Document Revised: 04/02/2013 Document Reviewed: 01/17/2013 Exit[employer] Patient Information 2014 [employer].  

## 2013-06-27 DIAGNOSIS — R0602 Shortness of breath: Secondary | ICD-10-CM | POA: Diagnosis not present

## 2013-06-27 DIAGNOSIS — D649 Anemia, unspecified: Secondary | ICD-10-CM | POA: Diagnosis not present

## 2013-06-27 DIAGNOSIS — R490 Dysphonia: Secondary | ICD-10-CM | POA: Diagnosis not present

## 2013-06-27 DIAGNOSIS — J392 Other diseases of pharynx: Secondary | ICD-10-CM | POA: Diagnosis not present

## 2013-06-27 DIAGNOSIS — Z880 Allergy status to penicillin: Secondary | ICD-10-CM | POA: Diagnosis not present

## 2013-06-27 DIAGNOSIS — Z9109 Other allergy status, other than to drugs and biological substances: Secondary | ICD-10-CM | POA: Diagnosis not present

## 2013-06-27 DIAGNOSIS — Z91018 Allergy to other foods: Secondary | ICD-10-CM | POA: Diagnosis not present

## 2013-06-27 DIAGNOSIS — R1310 Dysphagia, unspecified: Secondary | ICD-10-CM | POA: Diagnosis not present

## 2013-06-27 DIAGNOSIS — R059 Cough, unspecified: Secondary | ICD-10-CM | POA: Diagnosis not present

## 2013-06-27 DIAGNOSIS — Z885 Allergy status to narcotic agent status: Secondary | ICD-10-CM | POA: Diagnosis not present

## 2013-06-27 DIAGNOSIS — E559 Vitamin D deficiency, unspecified: Secondary | ICD-10-CM | POA: Diagnosis not present

## 2013-06-27 DIAGNOSIS — M889 Osteitis deformans of unspecified bone: Secondary | ICD-10-CM | POA: Diagnosis not present

## 2013-06-27 DIAGNOSIS — C549 Malignant neoplasm of corpus uteri, unspecified: Secondary | ICD-10-CM | POA: Diagnosis not present

## 2013-06-27 DIAGNOSIS — E269 Hyperaldosteronism, unspecified: Secondary | ICD-10-CM | POA: Diagnosis not present

## 2013-06-27 DIAGNOSIS — R05 Cough: Secondary | ICD-10-CM | POA: Diagnosis not present

## 2013-06-27 DIAGNOSIS — T7840XA Allergy, unspecified, initial encounter: Secondary | ICD-10-CM | POA: Diagnosis not present

## 2013-06-27 DIAGNOSIS — Z886 Allergy status to analgesic agent status: Secondary | ICD-10-CM | POA: Diagnosis not present

## 2013-06-27 DIAGNOSIS — Z888 Allergy status to other drugs, medicaments and biological substances status: Secondary | ICD-10-CM | POA: Diagnosis not present

## 2013-06-27 DIAGNOSIS — R220 Localized swelling, mass and lump, head: Secondary | ICD-10-CM | POA: Diagnosis not present

## 2013-06-27 DIAGNOSIS — Z881 Allergy status to other antibiotic agents status: Secondary | ICD-10-CM | POA: Diagnosis not present

## 2013-06-30 LAB — C1 ESTERASE INHIBITOR, FUNCTIONAL: C1INH Functional/C1INH Total MFr SerPl: 97 % (ref >=68)

## 2013-07-01 DIAGNOSIS — F339 Major depressive disorder, recurrent, unspecified: Secondary | ICD-10-CM | POA: Diagnosis not present

## 2013-07-01 DIAGNOSIS — D649 Anemia, unspecified: Secondary | ICD-10-CM | POA: Diagnosis not present

## 2013-07-03 DIAGNOSIS — E7800 Pure hypercholesterolemia, unspecified: Secondary | ICD-10-CM | POA: Diagnosis not present

## 2013-07-03 DIAGNOSIS — Z8542 Personal history of malignant neoplasm of other parts of uterus: Secondary | ICD-10-CM | POA: Diagnosis not present

## 2013-07-03 DIAGNOSIS — R1011 Right upper quadrant pain: Secondary | ICD-10-CM | POA: Diagnosis not present

## 2013-07-03 DIAGNOSIS — I10 Essential (primary) hypertension: Secondary | ICD-10-CM | POA: Diagnosis not present

## 2013-07-03 DIAGNOSIS — K5900 Constipation, unspecified: Secondary | ICD-10-CM | POA: Diagnosis not present

## 2013-07-03 DIAGNOSIS — R109 Unspecified abdominal pain: Secondary | ICD-10-CM | POA: Diagnosis not present

## 2013-07-03 DIAGNOSIS — G894 Chronic pain syndrome: Secondary | ICD-10-CM | POA: Diagnosis not present

## 2013-07-03 DIAGNOSIS — R110 Nausea: Secondary | ICD-10-CM | POA: Diagnosis not present

## 2013-07-03 DIAGNOSIS — Z79899 Other long term (current) drug therapy: Secondary | ICD-10-CM | POA: Diagnosis not present

## 2013-07-04 ENCOUNTER — Ambulatory Visit (INDEPENDENT_AMBULATORY_CARE_PROVIDER_SITE_OTHER): Payer: Medicare Other | Admitting: Family Medicine

## 2013-07-04 ENCOUNTER — Encounter: Payer: Self-pay | Admitting: Family Medicine

## 2013-07-04 VITALS — BP 147/90 | HR 74 | Wt 185.0 lb

## 2013-07-04 DIAGNOSIS — E269 Hyperaldosteronism, unspecified: Secondary | ICD-10-CM | POA: Diagnosis not present

## 2013-07-04 DIAGNOSIS — R141 Gas pain: Secondary | ICD-10-CM

## 2013-07-04 DIAGNOSIS — C549 Malignant neoplasm of corpus uteri, unspecified: Secondary | ICD-10-CM | POA: Diagnosis not present

## 2013-07-04 DIAGNOSIS — R21 Rash and other nonspecific skin eruption: Secondary | ICD-10-CM

## 2013-07-04 DIAGNOSIS — R140 Abdominal distension (gaseous): Secondary | ICD-10-CM

## 2013-07-04 DIAGNOSIS — R682 Dry mouth, unspecified: Secondary | ICD-10-CM

## 2013-07-04 DIAGNOSIS — I10 Essential (primary) hypertension: Secondary | ICD-10-CM | POA: Diagnosis not present

## 2013-07-04 DIAGNOSIS — C541 Malignant neoplasm of endometrium: Secondary | ICD-10-CM

## 2013-07-04 DIAGNOSIS — K117 Disturbances of salivary secretion: Secondary | ICD-10-CM
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
## 2013-07-06 DIAGNOSIS — K8020 Calculus of gallbladder without cholecystitis without obstruction: Secondary | ICD-10-CM | POA: Diagnosis not present

## 2013-07-06 DIAGNOSIS — K5900 Constipation, unspecified: Secondary | ICD-10-CM | POA: Diagnosis not present

## 2013-07-06 DIAGNOSIS — R1084 Generalized abdominal pain: Secondary | ICD-10-CM | POA: Diagnosis not present

## 2013-07-06 DIAGNOSIS — R109 Unspecified abdominal pain: Secondary | ICD-10-CM | POA: Diagnosis not present

## 2013-07-06 DIAGNOSIS — R112 Nausea with vomiting, unspecified: Secondary | ICD-10-CM | POA: Diagnosis not present

## 2013-07-06 DIAGNOSIS — K6289 Other specified diseases of anus and rectum: Secondary | ICD-10-CM | POA: Diagnosis not present

## 2013-07-07 ENCOUNTER — Emergency Department (HOSPITAL_BASED_OUTPATIENT_CLINIC_OR_DEPARTMENT_OTHER)
Admission: EM | Admit: 2013-07-07 | Discharge: 2013-07-07 | Disposition: A | Discharge status: Home / Self Care | Payer: Medicare Other | Attending: Emergency Medicine | Admitting: Emergency Medicine

## 2013-07-07 ENCOUNTER — Emergency Department (HOSPITAL_BASED_OUTPATIENT_CLINIC_OR_DEPARTMENT_OTHER): Payer: Medicare Other

## 2013-07-07 ENCOUNTER — Encounter (HOSPITAL_BASED_OUTPATIENT_CLINIC_OR_DEPARTMENT_OTHER): Payer: Self-pay | Admitting: Emergency Medicine

## 2013-07-07 DIAGNOSIS — K589 Irritable bowel syndrome without diarrhea: Secondary | ICD-10-CM | POA: Diagnosis not present

## 2013-07-07 DIAGNOSIS — K8020 Calculus of gallbladder without cholecystitis without obstruction: Secondary | ICD-10-CM | POA: Diagnosis not present

## 2013-07-07 DIAGNOSIS — J45909 Unspecified asthma, uncomplicated: Secondary | ICD-10-CM | POA: Diagnosis not present

## 2013-07-07 DIAGNOSIS — Z79899 Other long term (current) drug therapy: Secondary | ICD-10-CM | POA: Diagnosis not present

## 2013-07-07 DIAGNOSIS — Z85828 Personal history of other malignant neoplasm of skin: Secondary | ICD-10-CM | POA: Insufficient documentation

## 2013-07-07 DIAGNOSIS — Z87891 Personal history of nicotine dependence: Secondary | ICD-10-CM | POA: Diagnosis not present

## 2013-07-07 DIAGNOSIS — R112 Nausea with vomiting, unspecified: Secondary | ICD-10-CM | POA: Insufficient documentation

## 2013-07-07 DIAGNOSIS — K5900 Constipation, unspecified: Secondary | ICD-10-CM | POA: Diagnosis not present

## 2013-07-07 DIAGNOSIS — R109 Unspecified abdominal pain: Secondary | ICD-10-CM | POA: Diagnosis not present

## 2013-07-07 DIAGNOSIS — K219 Gastro-esophageal reflux disease without esophagitis: Secondary | ICD-10-CM | POA: Insufficient documentation

## 2013-07-07 DIAGNOSIS — R1084 Generalized abdominal pain: Secondary | ICD-10-CM | POA: Diagnosis not present

## 2013-07-07 DIAGNOSIS — Z3202 Encounter for pregnancy test, result negative: Secondary | ICD-10-CM | POA: Insufficient documentation

## 2013-07-07 DIAGNOSIS — R630 Anorexia: Secondary | ICD-10-CM | POA: Insufficient documentation

## 2013-07-07 DIAGNOSIS — IMO0002 Reserved for concepts with insufficient information to code with codable children: Secondary | ICD-10-CM | POA: Insufficient documentation

## 2013-07-07 DIAGNOSIS — I10 Essential (primary) hypertension: Secondary | ICD-10-CM | POA: Insufficient documentation

## 2013-07-07 DIAGNOSIS — Z8739 Personal history of other diseases of the musculoskeletal system and connective tissue: Secondary | ICD-10-CM | POA: Diagnosis not present

## 2013-07-07 DIAGNOSIS — Z862 Personal history of diseases of the blood and blood-forming organs and certain disorders involving the immune mechanism: Secondary | ICD-10-CM | POA: Insufficient documentation

## 2013-07-07 DIAGNOSIS — G4730 Sleep apnea, unspecified: Secondary | ICD-10-CM | POA: Insufficient documentation

## 2013-07-07 DIAGNOSIS — R197 Diarrhea, unspecified: Secondary | ICD-10-CM | POA: Insufficient documentation

## 2013-07-07 DIAGNOSIS — G8929 Other chronic pain: Secondary | ICD-10-CM | POA: Diagnosis not present

## 2013-07-07 DIAGNOSIS — Z8614 Personal history of Methicillin resistant Staphylococcus aureus infection: Secondary | ICD-10-CM | POA: Insufficient documentation

## 2013-07-07 DIAGNOSIS — Z8542 Personal history of malignant neoplasm of other parts of uterus: Secondary | ICD-10-CM | POA: Insufficient documentation

## 2013-07-07 DIAGNOSIS — E785 Hyperlipidemia, unspecified: Secondary | ICD-10-CM | POA: Diagnosis not present

## 2013-07-07 DIAGNOSIS — Z880 Allergy status to penicillin: Secondary | ICD-10-CM | POA: Diagnosis not present

## 2013-07-07 LAB — CBC WITH DIFFERENTIAL/PLATELET
Basophils Absolute: 0 10*3/uL (ref 0.0–0.1)
Basophils Relative: 0 % (ref 0–1)
Eosinophils Absolute: 0.1 10*3/uL (ref 0.0–0.7)
Eosinophils Relative: 2 % (ref 0–5)
HCT: 40.3 % (ref 36.0–46.0)
Hemoglobin: 13 g/dL (ref 12.0–15.0)
Lymphocytes Relative: 23 % (ref 12–46)
Lymphs Abs: 1.2 10*3/uL (ref 0.7–4.0)
MCH: 29 pg (ref 26.0–34.0)
MCHC: 32.3 g/dL (ref 30.0–36.0)
MCV: 90 fL (ref 78.0–100.0)
Monocytes Absolute: 0.5 10*3/uL (ref 0.1–1.0)
Monocytes Relative: 9 % (ref 3–12)
Neutro Abs: 3.5 10*3/uL (ref 1.7–7.7)
Neutrophils Relative %: 66 % (ref 43–77)
Platelets: 204 10*3/uL (ref 150–400)
RBC: 4.48 MIL/uL (ref 3.87–5.11)
RDW: 13.6 % (ref 11.5–15.5)
WBC: 5.3 10*3/uL (ref 4.0–10.5)

## 2013-07-07 LAB — URINALYSIS, ROUTINE W REFLEX MICROSCOPIC
Bilirubin Urine: NEGATIVE
Glucose, UA: NEGATIVE mg/dL
Hgb urine dipstick: NEGATIVE
Ketones, ur: NEGATIVE mg/dL
Leukocytes, UA: NEGATIVE
Nitrite: NEGATIVE
Protein, ur: NEGATIVE mg/dL
Specific Gravity, Urine: 1.021 (ref 1.005–1.030)
Urobilinogen, UA: 0.2 mg/dL (ref 0.0–1.0)
pH: 6 (ref 5.0–8.0)

## 2013-07-07 LAB — COMPREHENSIVE METABOLIC PANEL
ALT: 18 U/L (ref 0–35)
AST: 14 U/L (ref 0–37)
Albumin: 3.7 g/dL (ref 3.5–5.2)
Alkaline Phosphatase: 60 U/L (ref 39–117)
BUN: 10 mg/dL (ref 6–23)
CO2: 24 mEq/L (ref 19–32)
Calcium: 9.3 mg/dL (ref 8.4–10.5)
Chloride: 104 mEq/L (ref 96–112)
Creatinine, Ser: 0.5 mg/dL (ref 0.50–1.10)
GFR calc Af Amer: >90 mL/min (ref >90)
GFR calc non Af Amer: >90 mL/min (ref >90)
Glucose, Bld: 127 mg/dL — ABNORMAL HIGH (ref 70–99)
Potassium: 3.6 mEq/L (ref 3.5–5.1)
Sodium: 139 mEq/L (ref 135–145)
Total Bilirubin: 0.2 mg/dL — ABNORMAL LOW (ref 0.3–1.2)
Total Protein: 7 g/dL (ref 6.0–8.3)

## 2013-07-07 LAB — PREGNANCY, URINE: Preg Test, Ur: NEGATIVE

## 2013-07-07 LAB — LIPASE, BLOOD: Lipase: 45 U/L (ref 11–59)

## 2013-07-07 MED ORDER — GI COCKTAIL ~~LOC~~: 30.0000 mL | Freq: Once | ORAL | Status: DC

## 2013-07-07 MED ORDER — ONDANSETRON HCL 4 MG/2ML IJ SOLN: 4.0000 mg | Freq: Once | INTRAMUSCULAR | Status: DC

## 2013-07-07 NOTE — ED Notes (Signed)
Pt does not want GI Cocktail.

## 2013-07-07 NOTE — ED Notes (Signed)
Patient states she has a three history of right upper and lower abdominal pain which is associated with nausea and constipation.  States she has seen her PCP, [facility] for the same.  States she has used mag citrate and fleets for the constipation with minimal results.  States she does not feel good.

## 2013-07-07 NOTE — ED Notes (Signed)
Pt does not want Zofran.

## 2013-07-07 NOTE — ED Notes (Signed)
Patient transported to X-ray 
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
## 2013-07-09 DIAGNOSIS — K146 Glossodynia: Secondary | ICD-10-CM | POA: Diagnosis not present

## 2013-07-09 DIAGNOSIS — K219 Gastro-esophageal reflux disease without esophagitis: Secondary | ICD-10-CM | POA: Diagnosis not present

## 2013-07-09 DIAGNOSIS — J310 Chronic rhinitis: Secondary | ICD-10-CM | POA: Diagnosis not present

## 2013-07-13 IMAGING — CR DG CHEST 2V
3 series · 3 of 3 positions shown · non-contrast
Comparison: 03/08/2012.

CLINICAL DATA: Cough, congestion and sore throat.

CHEST - 2 VIEW

[w chest pa]
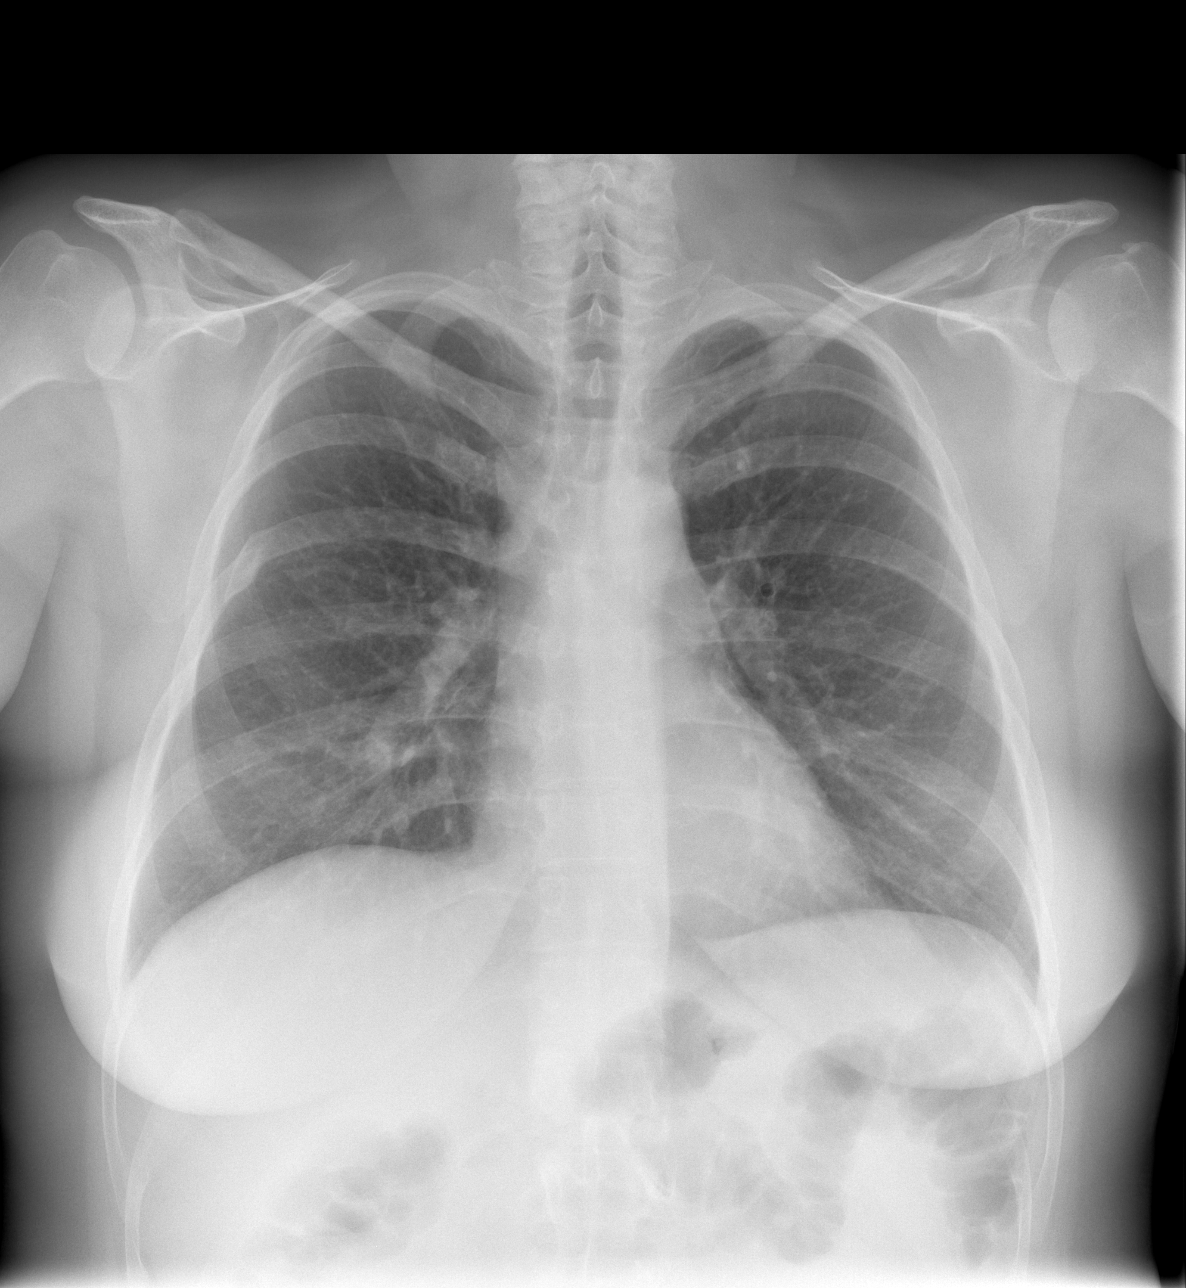

[w chest lat (1 of 2)]
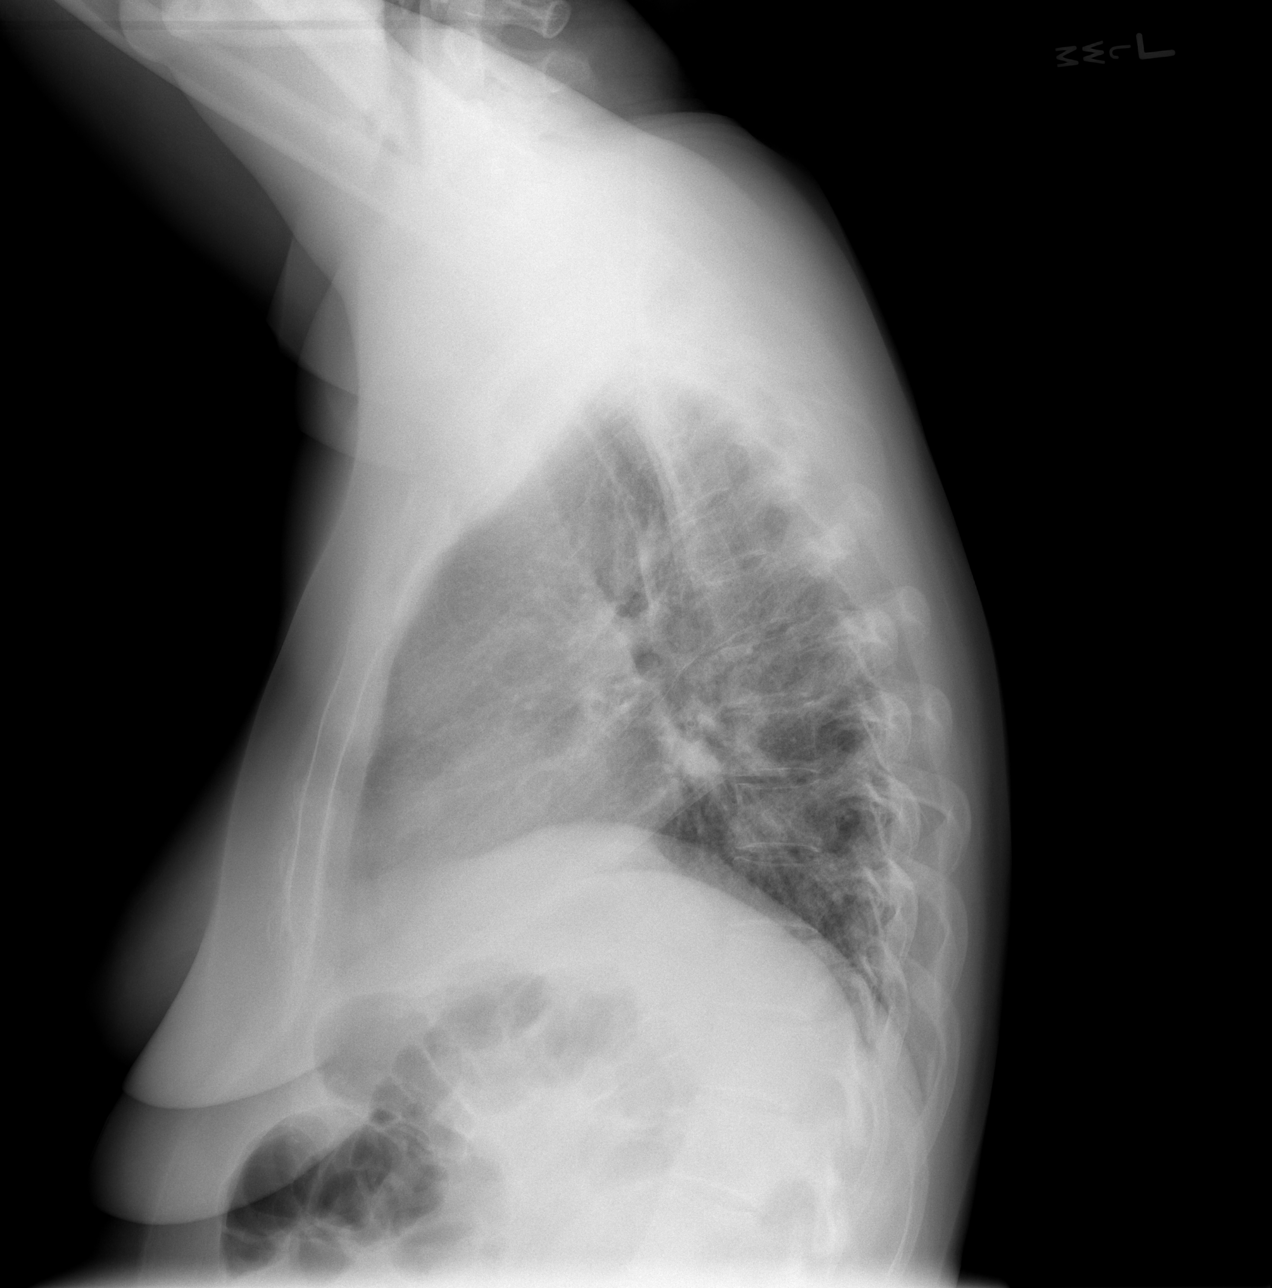

[w chest lat (2 of 2)]
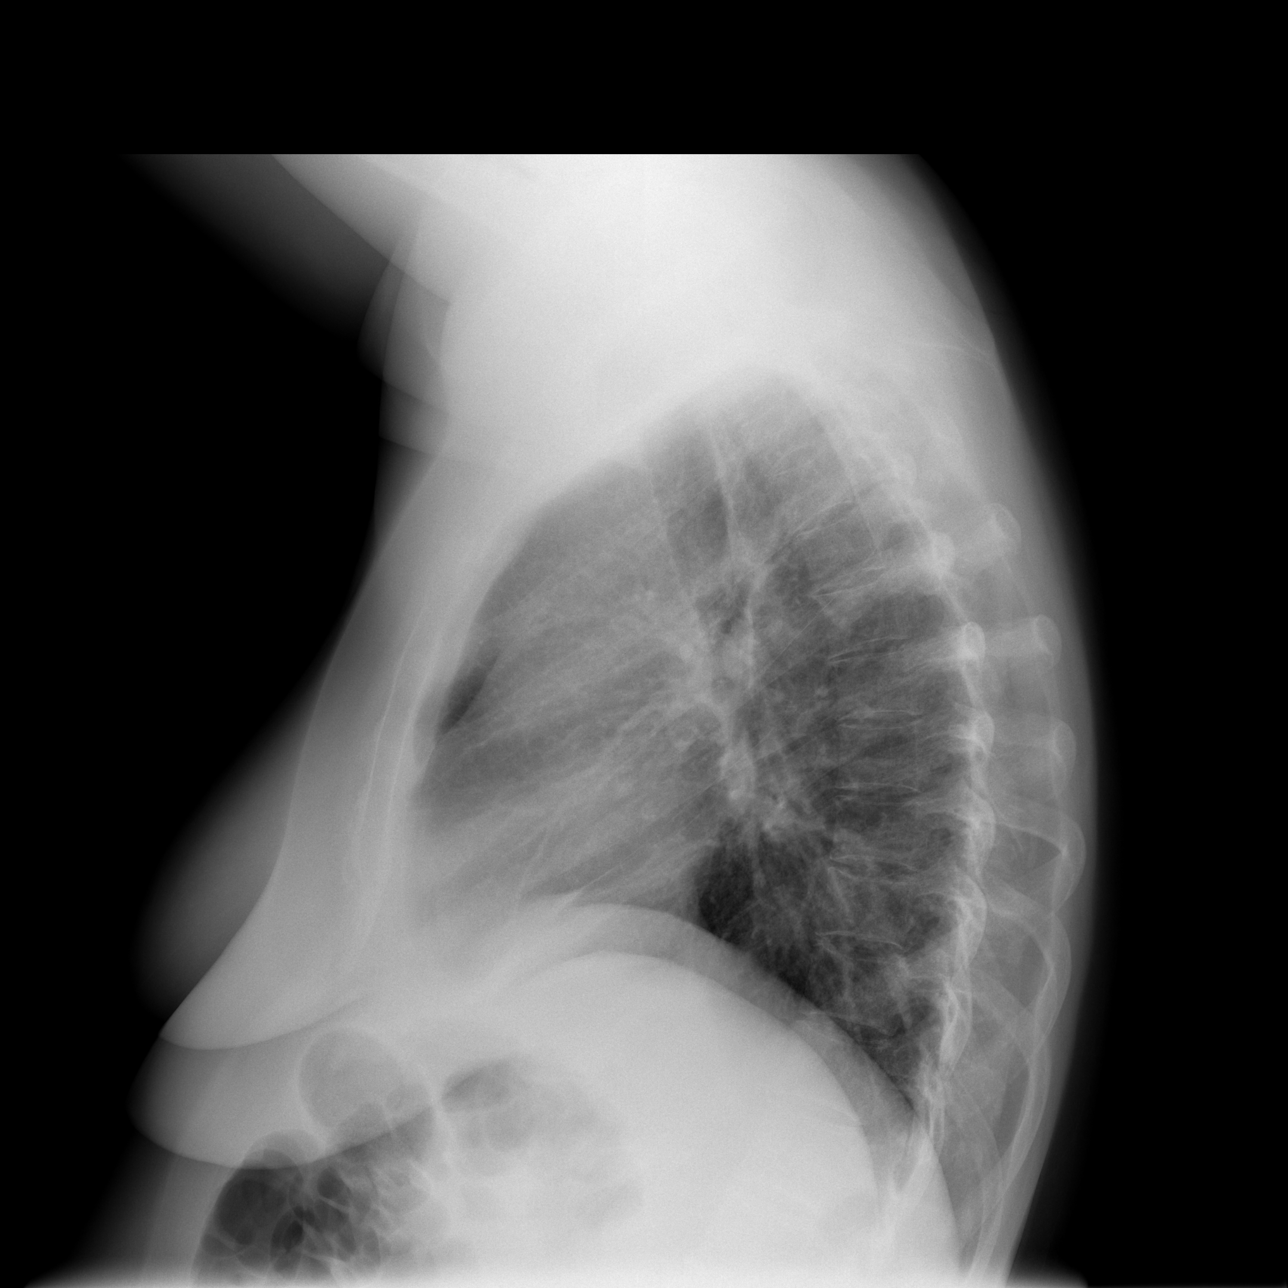

[3 of 3 positions shown; findings below may reference images not displayed]

FINDINGS: The cardiac silhouette, mediastinal and hilar contours
are normal.  The lungs are clear.  No pleural effusion.  The bony
thorax is intact.  The remote right sixth rib fracture again
demonstrated.
IMPRESSION: No acute cardiopulmonary findings.

## 2013-07-14 ENCOUNTER — Telehealth: Payer: Self-pay | Admitting: *Deleted

## 2013-07-14 DIAGNOSIS — E785 Hyperlipidemia, unspecified: Secondary | ICD-10-CM | POA: Diagnosis not present

## 2013-07-14 DIAGNOSIS — Z882 Allergy status to sulfonamides status: Secondary | ICD-10-CM | POA: Diagnosis not present

## 2013-07-14 DIAGNOSIS — G43909 Migraine, unspecified, not intractable, without status migrainosus: Secondary | ICD-10-CM | POA: Diagnosis not present

## 2013-07-14 DIAGNOSIS — Z886 Allergy status to analgesic agent status: Secondary | ICD-10-CM | POA: Diagnosis not present

## 2013-07-14 DIAGNOSIS — E269 Hyperaldosteronism, unspecified: Secondary | ICD-10-CM | POA: Diagnosis not present

## 2013-07-14 DIAGNOSIS — F411 Generalized anxiety disorder: Secondary | ICD-10-CM | POA: Diagnosis not present

## 2013-07-14 DIAGNOSIS — Z888 Allergy status to other drugs, medicaments and biological substances status: Secondary | ICD-10-CM | POA: Diagnosis not present

## 2013-07-14 DIAGNOSIS — Z79899 Other long term (current) drug therapy: Secondary | ICD-10-CM | POA: Diagnosis not present

## 2013-07-14 DIAGNOSIS — M79609 Pain in unspecified limb: Secondary | ICD-10-CM | POA: Diagnosis not present

## 2013-07-14 DIAGNOSIS — G40909 Epilepsy, unspecified, not intractable, without status epilepticus: Secondary | ICD-10-CM | POA: Diagnosis not present

## 2013-07-14 DIAGNOSIS — I471 Supraventricular tachycardia: Secondary | ICD-10-CM | POA: Diagnosis not present

## 2013-07-14 DIAGNOSIS — Z885 Allergy status to narcotic agent status: Secondary | ICD-10-CM | POA: Diagnosis not present

## 2013-07-14 DIAGNOSIS — D509 Iron deficiency anemia, unspecified: Secondary | ICD-10-CM | POA: Diagnosis not present

## 2013-07-14 DIAGNOSIS — Z883 Allergy status to other anti-infective agents status: Secondary | ICD-10-CM | POA: Diagnosis not present

## 2013-07-14 DIAGNOSIS — J45909 Unspecified asthma, uncomplicated: Secondary | ICD-10-CM | POA: Diagnosis not present

## 2013-07-14 DIAGNOSIS — M549 Dorsalgia, unspecified: Secondary | ICD-10-CM | POA: Diagnosis not present

## 2013-07-14 DIAGNOSIS — Z87891 Personal history of nicotine dependence: Secondary | ICD-10-CM | POA: Diagnosis not present

## 2013-07-14 DIAGNOSIS — Z880 Allergy status to penicillin: Secondary | ICD-10-CM | POA: Diagnosis not present

## 2013-07-14 DIAGNOSIS — K219 Gastro-esophageal reflux disease without esophagitis: Secondary | ICD-10-CM | POA: Diagnosis not present

## 2013-07-14 DIAGNOSIS — M25569 Pain in unspecified knee: Secondary | ICD-10-CM | POA: Diagnosis not present

## 2013-07-14 DIAGNOSIS — I10 Essential (primary) hypertension: Secondary | ICD-10-CM | POA: Diagnosis not present

## 2013-07-14 NOTE — Telephone Encounter (Signed)
Pt called and stated that she was seen at [facility] and is not feeling well and they did bloodwork and told her that her d-dimer was elevated and she is in pain. She stated that she told them that she was having thigh pain and they did not do any xrays of her hip. She ca

## 2013-07-15 DIAGNOSIS — R0602 Shortness of breath: Secondary | ICD-10-CM | POA: Diagnosis not present

## 2013-07-15 DIAGNOSIS — M549 Dorsalgia, unspecified: Secondary | ICD-10-CM | POA: Diagnosis not present

## 2013-07-15 DIAGNOSIS — R0789 Other chest pain: Secondary | ICD-10-CM | POA: Diagnosis not present

## 2013-07-15 DIAGNOSIS — R0609 Other forms of dyspnea: Secondary | ICD-10-CM | POA: Diagnosis not present

## 2013-07-15 DIAGNOSIS — Z79899 Other long term (current) drug therapy: Secondary | ICD-10-CM | POA: Diagnosis not present

## 2013-07-15 DIAGNOSIS — I10 Essential (primary) hypertension: Secondary | ICD-10-CM | POA: Diagnosis not present

## 2013-07-15 DIAGNOSIS — Z886 Allergy status to analgesic agent status: Secondary | ICD-10-CM | POA: Diagnosis not present

## 2013-07-15 DIAGNOSIS — Z888 Allergy status to other drugs, medicaments and biological substances status: Secondary | ICD-10-CM | POA: Diagnosis not present

## 2013-07-15 DIAGNOSIS — R0989 Other specified symptoms and signs involving the circulatory and respiratory systems: Secondary | ICD-10-CM | POA: Diagnosis not present

## 2013-07-15 DIAGNOSIS — Z8673 Personal history of transient ischemic attack (TIA), and cerebral infarction without residual deficits: Secondary | ICD-10-CM | POA: Diagnosis not present

## 2013-07-15 DIAGNOSIS — M25569 Pain in unspecified knee: Secondary | ICD-10-CM | POA: Diagnosis not present

## 2013-07-15 DIAGNOSIS — M79609 Pain in unspecified limb: Secondary | ICD-10-CM | POA: Diagnosis not present

## 2013-07-15 NOTE — Telephone Encounter (Signed)
Jennifer [name] called this morning requesting to know if you have heard anything back from Dr. Judie Petit yet. She is unable to get in with [facility] until Dec 10th which is her next available for Dr. Judie Petit

## 2013-07-15 NOTE — Telephone Encounter (Signed)
Pt called [facility] informed that her level seems to run higher which can be nl for her, told that if she wanted we could order hip xray. Asked to return call.Loralee Pacas [place]

## 2013-07-17 ENCOUNTER — Encounter: Payer: Self-pay | Admitting: Gynecologic Oncology

## 2013-07-17 ENCOUNTER — Encounter (HOSPITAL_COMMUNITY): Payer: Self-pay | Admitting: Emergency Medicine

## 2013-07-17 ENCOUNTER — Encounter (HOSPITAL_COMMUNITY): Payer: Self-pay | Admitting: Pharmacy Technician

## 2013-07-17 ENCOUNTER — Ambulatory Visit: Payer: Medicare Other | Admitting: Gynecologic Oncology

## 2013-07-17 ENCOUNTER — Encounter (HOSPITAL_COMMUNITY): Payer: Self-pay

## 2013-07-17 ENCOUNTER — Other Ambulatory Visit: Payer: Self-pay

## 2013-07-17 ENCOUNTER — Emergency Department (HOSPITAL_COMMUNITY): Payer: Medicare Other

## 2013-07-17 ENCOUNTER — Encounter (HOSPITAL_COMMUNITY)
Admission: RE | Admit: 2013-07-17 | Discharge: 2013-07-17 | Disposition: A | Discharge status: Home / Self Care | Payer: Medicare Other | Source: Ambulatory Visit | Attending: Gynecologic Oncology | Admitting: Gynecologic Oncology

## 2013-07-17 ENCOUNTER — Emergency Department (HOSPITAL_COMMUNITY)
Admission: EM | Admit: 2013-07-17 | Discharge: 2013-07-17 | Disposition: A | Discharge status: Home / Self Care | Payer: Medicare Other | Attending: Emergency Medicine | Admitting: Emergency Medicine

## 2013-07-17 VITALS — BP 143/91 | HR 84 | Temp 99.2°F | Resp 18 | Wt 185.0 lb

## 2013-07-17 DIAGNOSIS — Z95818 Presence of other cardiac implants and grafts: Secondary | ICD-10-CM | POA: Insufficient documentation

## 2013-07-17 DIAGNOSIS — R109 Unspecified abdominal pain: Secondary | ICD-10-CM | POA: Diagnosis not present

## 2013-07-17 DIAGNOSIS — K5900 Constipation, unspecified: Secondary | ICD-10-CM | POA: Insufficient documentation

## 2013-07-17 DIAGNOSIS — Z8669 Personal history of other diseases of the nervous system and sense organs: Secondary | ICD-10-CM | POA: Insufficient documentation

## 2013-07-17 DIAGNOSIS — I10 Essential (primary) hypertension: Secondary | ICD-10-CM | POA: Insufficient documentation

## 2013-07-17 DIAGNOSIS — R0602 Shortness of breath: Secondary | ICD-10-CM | POA: Insufficient documentation

## 2013-07-17 DIAGNOSIS — G8929 Other chronic pain: Secondary | ICD-10-CM | POA: Insufficient documentation

## 2013-07-17 DIAGNOSIS — Z880 Allergy status to penicillin: Secondary | ICD-10-CM | POA: Insufficient documentation

## 2013-07-17 DIAGNOSIS — J45909 Unspecified asthma, uncomplicated: Secondary | ICD-10-CM | POA: Diagnosis not present

## 2013-07-17 DIAGNOSIS — Z8614 Personal history of Methicillin resistant Staphylococcus aureus infection: Secondary | ICD-10-CM | POA: Insufficient documentation

## 2013-07-17 DIAGNOSIS — Z862 Personal history of diseases of the blood and blood-forming organs and certain disorders involving the immune mechanism: Secondary | ICD-10-CM | POA: Insufficient documentation

## 2013-07-17 DIAGNOSIS — Z79899 Other long term (current) drug therapy: Secondary | ICD-10-CM | POA: Diagnosis not present

## 2013-07-17 DIAGNOSIS — M25569 Pain in unspecified knee: Secondary | ICD-10-CM | POA: Insufficient documentation

## 2013-07-17 DIAGNOSIS — Z87891 Personal history of nicotine dependence: Secondary | ICD-10-CM | POA: Insufficient documentation

## 2013-07-17 DIAGNOSIS — Z8742 Personal history of other diseases of the female genital tract: Secondary | ICD-10-CM | POA: Diagnosis not present

## 2013-07-17 DIAGNOSIS — Z9851 Tubal ligation status: Secondary | ICD-10-CM | POA: Diagnosis not present

## 2013-07-17 DIAGNOSIS — Z8639 Personal history of other endocrine, nutritional and metabolic disease: Secondary | ICD-10-CM | POA: Insufficient documentation

## 2013-07-17 DIAGNOSIS — G4730 Sleep apnea, unspecified: Secondary | ICD-10-CM | POA: Insufficient documentation

## 2013-07-17 DIAGNOSIS — R1909 Other intra-abdominal and pelvic swelling, mass and lump: Secondary | ICD-10-CM | POA: Diagnosis not present

## 2013-07-17 DIAGNOSIS — Z8659 Personal history of other mental and behavioral disorders: Secondary | ICD-10-CM | POA: Diagnosis not present

## 2013-07-17 DIAGNOSIS — K219 Gastro-esophageal reflux disease without esophagitis: Secondary | ICD-10-CM | POA: Diagnosis not present

## 2013-07-17 DIAGNOSIS — C541 Malignant neoplasm of endometrium: Secondary | ICD-10-CM

## 2013-07-17 DIAGNOSIS — R141 Gas pain: Secondary | ICD-10-CM | POA: Diagnosis not present

## 2013-07-17 DIAGNOSIS — C549 Malignant neoplasm of corpus uteri, unspecified: Secondary | ICD-10-CM | POA: Diagnosis not present

## 2013-07-17 DIAGNOSIS — R140 Abdominal distension (gaseous): Secondary | ICD-10-CM

## 2013-07-17 DIAGNOSIS — R0609 Other forms of dyspnea: Secondary | ICD-10-CM | POA: Diagnosis not present

## 2013-07-17 HISTORY — DX: Other diseases of vocal cords: J38.3

## 2013-07-17 HISTORY — DX: Other complications of anesthesia, initial encounter: T88.59XA

## 2013-07-17 HISTORY — DX: Claustrophobia: F40.240

## 2013-07-17 HISTORY — DX: Adverse effect of unspecified anesthetic, initial encounter: T41.45XA

## 2013-07-17 LAB — COMPREHENSIVE METABOLIC PANEL
ALT: 26 U/L (ref 0–35)
AST: 19 U/L (ref 0–37)
Albumin: 3.8 g/dL (ref 3.5–5.2)
Alkaline Phosphatase: 69 U/L (ref 39–117)
BUN: 12 mg/dL (ref 6–23)
CO2: 23 mEq/L (ref 19–32)
Calcium: 9.3 mg/dL (ref 8.4–10.5)
Chloride: 106 mEq/L (ref 96–112)
Creatinine, Ser: 0.52 mg/dL (ref 0.50–1.10)
GFR calc Af Amer: >90 mL/min (ref >90)
GFR calc non Af Amer: >90 mL/min (ref >90)
Glucose, Bld: 96 mg/dL (ref 70–99)
Potassium: 3.6 mEq/L (ref 3.5–5.1)
Sodium: 139 mEq/L (ref 135–145)
Total Bilirubin: 0.2 mg/dL — ABNORMAL LOW (ref 0.3–1.2)
Total Protein: 7.5 g/dL (ref 6.0–8.3)

## 2013-07-17 LAB — CBC WITH DIFFERENTIAL/PLATELET
Basophils Absolute: 0 10*3/uL (ref 0.0–0.1)
Basophils Relative: 1 % (ref 0–1)
Eosinophils Absolute: 0.1 10*3/uL (ref 0.0–0.7)
Eosinophils Relative: 2 % (ref 0–5)
HCT: 41.4 % (ref 36.0–46.0)
Hemoglobin: 13.6 g/dL (ref 12.0–15.0)
Lymphocytes Relative: 21 % (ref 12–46)
Lymphs Abs: 1.2 10*3/uL (ref 0.7–4.0)
MCH: 29 pg (ref 26.0–34.0)
MCHC: 32.9 g/dL (ref 30.0–36.0)
MCV: 88.3 fL (ref 78.0–100.0)
Monocytes Absolute: 0.6 10*3/uL (ref 0.1–1.0)
Monocytes Relative: 10 % (ref 3–12)
Neutro Abs: 3.8 10*3/uL (ref 1.7–7.7)
Neutrophils Relative %: 66 % (ref 43–77)
Platelets: 233 10*3/uL (ref 150–400)
RBC: 4.69 MIL/uL (ref 3.87–5.11)
RDW: 13.9 % (ref 11.5–15.5)
WBC: 5.8 10*3/uL (ref 4.0–10.5)

## 2013-07-17 LAB — URINALYSIS, ROUTINE W REFLEX MICROSCOPIC
Bilirubin Urine: NEGATIVE
Glucose, UA: NEGATIVE mg/dL
Hgb urine dipstick: NEGATIVE
Ketones, ur: NEGATIVE mg/dL
Leukocytes, UA: NEGATIVE
Nitrite: NEGATIVE
Protein, ur: NEGATIVE mg/dL
Specific Gravity, Urine: 1.037 — ABNORMAL HIGH (ref 1.005–1.030)
Urobilinogen, UA: 0.2 mg/dL (ref 0.0–1.0)
pH: 5.5 (ref 5.0–8.0)

## 2013-07-17 LAB — PREGNANCY, URINE: Preg Test, Ur: NEGATIVE

## 2013-07-17 LAB — SURGICAL PCR SCREEN
MRSA, PCR: NEGATIVE
Staphylococcus aureus: NEGATIVE

## 2013-07-17 MED ORDER — IOHEXOL 350 MG/ML SOLN
100.0000 mL | Freq: Once | INTRAVENOUS | Status: AC | PRN
  Administered 2013-07-17: 100 mL via INTRAVENOUS

## 2013-07-17 MED ORDER — SODIUM CHLORIDE 0.9 % IV BOLUS (SEPSIS)
1000.0000 mL | Freq: Once | INTRAVENOUS | Status: AC
  Administered 2013-07-17: 1000 mL via INTRAVENOUS

## 2013-07-17 MED ORDER — IBUPROFEN 200 MG PO TABS: 600.0000 mg | ORAL_TABLET | Freq: Once | ORAL | Status: DC
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
## 2013-07-17 NOTE — Pre-Procedure Instructions (Addendum)
07-17-13 EKG/ CXR- 10'14-Epic, Abd/1-view CXR 06-17-13-Epic okayed to use per Dr. Renold Don, in to see pt. And discuss anesthesia plan. 14-4-14 Melissa Cross,NP aware CXR's in Epic to be used, none to be done today. 07-17-13 Pt. Has further questions for MD-surgical consent to be signed AM of. 07-17-13 Surgery anesthesia records requested  From [facility] 12-15-10(placed with chart), and [facility] 7'14(records placed with chart). Pt. Has sensitivity to Hospital linens. 07-18-13 1220 Reviewed CT Chest/ EKG -Epic, done 07-17-13(ED visit), pt. Discharged to home.

## 2013-07-17 NOTE — ED Notes (Signed)
Pt alert, nad, presents to ED with c/o SOB, pt was having pre-op appointment for sx on 07/29/13, resp even unlabored, skin pwd

## 2013-07-17 NOTE — Patient Instructions (Signed)
Hysterectomy Care After These instructions give you information on caring for yourself after your procedure. Your doctor may also give you more specific instructions. Call your doctor if you have any problems or questions after your procedure. HOME CARE Healing takes time. You may have discomfort, tenderness, puffiness (swelling), and bruising at the wound site. This may last for 2 weeks. This is normal and will get better.  Only take medicine as told by your doctor.  Do not take aspirin.  Do not drive when taking pain medicine.  Exercise, lift objects, drive, and get back to daily activites as told by your doctor.  Get back to your normal diet and activities as told by your doctor.  Get plenty of rest and sleep.  Do not douche, use tampons, or have sex (intercourse) for at least 6 weeks or as told.  Change your bandages (dressings) as told by your doctor.  Take your temperature during the day.  Take showers for 2 to 3 weeks. Do not take baths.  Do not drink alcohol until your doctor says it is okay.  Take a medicine to help you poop (laxative) as told by your doctor. Try eating bran foods. Drink enough fluids to keep your pee (urine) clear or pale yellow.  Have someone help you at home for 1 to 2 weeks after your surgery.  Keep follow-up doctor visits as told. GET HELP RIGHT AWAY IF:   You have a fever.  You have bad belly (abdominal) pain.  You have chest pain.  You are short of breath.  You pass out (faint).  You have pain, puffiness, or redness of your leg.  You bleed a lot from your vagina and notice clumps of tissue (clots).  You have puffiness, redness, or pain where a tube was put in your vein (IV) or in the wound area.  You have yellowish-white fluid (pus) coming from the wound.  You have a bad smell coming from the wound or bandage.  Your wound pulls apart.  You feel dizzy or lightheaded.  You have pain or bleeding when you pee.  You keep having  watery poop (diarrhea).  You keep feeling sick to your stomach (nauseous) or keep throwing up (vomiting).  You have fluid (discharge) coming from your vagina.  You have a rash.  You have a reaction to your medicine.  You need stronger pain medicine. MAKE SURE YOU:  Understand these instructions.  Will watch your condition.  Will get help right away if you are not doing well or get worse. Document Released: 05/09/2008 Document Revised: 10/23/2011 Document Reviewed: 03/17/2011 [facility] Patient Information 2014 [place], [place]. Hysterectomy Information  A hysterectomy is a procedure where your uterus is surgically removed. It will no longer be possible to have menstrual periods or to become pregnant. The tubes and ovaries can be removed (bilateral salpingo-oopherectomy) during this surgery as well.  REASONS FOR A HYSTERECTOMY  Persistent, abnormal bleeding.  Lasting (chronic) pelvic pain or infection.  The lining of the uterus (endometrium) starts growing outside the uterus (endometriosis).  The endometrium starts growing in the muscle of the uterus (adenomyosis).  The uterus falls down into the vagina (pelvic organ prolapse).  Symptomatic uterine fibroids.  Precancerous cells.  Cervical cancer or uterine cancer. TYPES OF HYSTERECTOMIES  Supracervical hysterectomy. This type removes the top part of the uterus, but not the cervix.  Total hysterectomy. This type removes the uterus and cervix.  Radical hysterectomy. This type removes the uterus, cervix, and the fibrous tissue  that holds the uterus in place in the pelvis (parametrium). WAYS A HYSTERECTOMY CAN BE PERFORMED  Abdominal hysterectomy. A large surgical cut (incision) is made in the abdomen. The uterus is removed through this incision.  Vaginal hysterectomy. An incision is made in the vagina. The uterus is removed through this incision. There are no abdominal incisions.  Conventional laparoscopic hysterectomy. A  thin, lighted tube with a camera (laparoscope) is inserted into 3 or 4 small incisions in the abdomen. The uterus is cut into small pieces. The small pieces are removed through the incisions, or they are removed through the vagina.  Laparoscopic assisted vaginal hysterectomy (LAVH). Three or four small incisions are made in the abdomen. Part of the surgery is performed laparoscopically and part vaginally. The uterus is removed through the vagina.  Robot-assisted laparoscopic hysterectomy. A laparoscope is inserted into 3 or 4 small incisions in the abdomen. A computer-controlled device is used to give the surgeon a 3D image. This allows for more precise movements of surgical instruments. The uterus is cut into small pieces and removed through the incisions or removed through the vagina. RISKS OF HYSTERECTOMY   Bleeding and risk of blood transfusion. Tell your caregiver if you do not want to receive any blood products.  Blood clots in the legs or lung.  Infection.  Injury to surrounding organs.  Anesthesia problems or side effects.  Conversion to an abdominal hysterectomy. WHAT TO EXPECT AFTER A HYSTERECTOMY  You will be given pain medicine.  You will need to have someone with you for the first 3 to 5 days after you go home.  You will need to follow up with your surgeon in 2 to 4 weeks after surgery to evaluate your progress.  You may have early menopause symptoms like hot flashes, night sweats, and insomnia.  If you had a hysterectomy for a problem that was not a cancer or a condition that could lead to cancer, then you no longer need Pap tests. However, even if you no longer need a Pap test, a regular exam is a good idea to make sure no other problems are starting. Document Released: 01/24/2001 Document Revised: 10/23/2011 Document Reviewed: 03/11/2011 [facility] Patient Information 2014 [place], [place].

## 2013-07-17 NOTE — Patient Instructions (Signed)
20 ORTHA METTS  07/17/2013   Your procedure is scheduled on:  12-16 -2014  Report to [facility] at      0515  AM..  Call this number if you have problems the morning of surgery: [phone]  Or Presurgical Testing [phone](Man Effertz)   Remember: Follow any bowel prep instructions per MD office.(Follow all MD instructions- Clear Liquids diet x24 hours preop) For Cpap use: Bring mask and tubing only.(not applies)   Do not eat food:After Midnight.    Clear liquids include soda, tea, black coffee, apple or grape juice, broth.(see list given)  Take these medicines the morning of surgery with A SIP OF WATER: Bring Epi-pen. Use/bring Xopenex inhaler. Use Benadryl if needed.   Do not wear jewelry, make-up or nail polish.  Do not wear lotions, powders, or perfumes. You may wear deodorant.  Do not shave 12 hours prior to first CHG shower(legs and under arms).(face and neck okay.)  Do not bring valuables to the hospital.  Contacts, dentures or removable bridgework, body piercing, hair pins may not be worn into surgery.  Leave suitcase in the car. After surgery it may be brought to your room.  For patients admitted to the hospital, checkout time is 11:00 AM the day of discharge.   Patients discharged the day of surgery will not be allowed to drive home. Must have responsible person with you x 24 hours once discharged.  Name and phone number of your driver: Lasean Rahming- [phone]   Special Instructions: CHG(Chlorhedine 4%-"Hibiclens","Betasept","Aplicare") Shower Use Special Wash: see special instructions.(avoid face and genitals)   Please read over the following fact sheets that you were given: MRSA Information, Blood Transfusion fact sheet, Incentive Spirometry Instruction.  Remember : Type/Screen "Blue armbands" - may not be removed once applied(would result in being retested if removed).  Failure to follow these instructions may result in Cancellation of your  surgery.   Patient signature_______________________________________________________

## 2013-07-17 NOTE — Anesthesia Preprocedure Evaluation (Addendum)
Anesthesia Evaluation  Patient identified by MRN, date of birth, ID band Patient awake    Reviewed: Allergy & Precautions, H&P , NPO status , Patient's Chart, lab work & pertinent test results  Airway Mallampati: II TM Distance: >3 FB Neck ROM: Full    Dental  (+) Dental Advisory Given and Teeth Intact   Pulmonary neg pulmonary ROS, asthma , sleep apnea , former smoker,  breath sounds clear to auscultation        Cardiovascular hypertension, Pt. on medications + dysrhythmias Supra Ventricular Tachycardia Rhythm:Regular Rate:Normal     Neuro/Psych  Headaches, Seizures -, Well Controlled,  PSYCHIATRIC DISORDERS Anxiety Depression  Neuromuscular disease negative neurological ROS     GI/Hepatic Neg liver ROS, GERD-  Medicated,  Endo/Other  negative endocrine ROS  Renal/GU negative Renal ROS     Musculoskeletal negative musculoskeletal ROS (+)   Abdominal (+) + obese,   Peds  Hematology  (+) anemia ,   Anesthesia Other Findings   Reproductive/Obstetrics negative OB ROS                       Anesthesia Physical Anesthesia Plan  ASA: III  Anesthesia Plan: General   Post-op Pain Management:    Induction: Intravenous  Airway Management Planned: Oral ETT and Video Laryngoscope Planned  Additional Equipment:   Intra-op Plan:   Post-operative Plan: Extubation in OR  Informed Consent: I have reviewed the patients History and Physical, chart, labs and discussed the procedure including the risks, benefits and alternatives for the proposed anesthesia with the patient or authorized representative who has indicated his/her understanding and acceptance.   Dental advisory given  Plan Discussed with: CRNA  Anesthesia Plan Comments: (Pt very anxious. Multiple allergies. Requests that only tylenol and demerol be used for analgesics and that a "pediatric" endotracheal tube be used. I discussed the need for  larger ETT for robotic surgery, but said we would use the appropriate size for her airway. I believe an elective glidescope may be advantageous here for optimal visualization and less airway trauma. She also requests that I, Dr. Renold Don, staff her case. I told her that may not be possible but I would try.  Pt is very upset that Dr. Renold Don is not here today. She is unreasonable,irrational,  agitated and borderline mentally unstable despite my best efforts to reassure her and promise to deliver optimal care. After further coversation she is willing to proceed. Concerns over intubation and multiple drug allergies discussed at length. She agrees to try dilaudid for post-op pain control as she has never tried this opioid in the past.)     Anesthesia Quick Evaluation
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
## 2013-07-21 ENCOUNTER — Ambulatory Visit: Payer: Medicare Other | Admitting: Endocrinology

## 2013-07-21 DIAGNOSIS — F339 Major depressive disorder, recurrent, unspecified: Secondary | ICD-10-CM | POA: Diagnosis not present

## 2013-07-21 NOTE — Pre-Procedure Instructions (Addendum)
[id number] Patient left voice message of change in surgery time to 1430 and to arrive at 1200 noon. To Short Stay, may have Clear Liquids only- to 0800 AM day of surgery. "Clerance Lav" -laundry-made aware of pt. special linen needs.

## 2013-07-23 ENCOUNTER — Ambulatory Visit: Payer: Medicare Other | Admitting: Family Medicine

## 2013-07-23 DIAGNOSIS — R4589 Other symptoms and signs involving emotional state: Secondary | ICD-10-CM | POA: Diagnosis not present

## 2013-07-23 DIAGNOSIS — F329 Major depressive disorder, single episode, unspecified: Secondary | ICD-10-CM | POA: Diagnosis not present

## 2013-07-23 DIAGNOSIS — Z79899 Other long term (current) drug therapy: Secondary | ICD-10-CM | POA: Diagnosis not present

## 2013-07-23 DIAGNOSIS — F411 Generalized anxiety disorder: Secondary | ICD-10-CM | POA: Diagnosis not present

## 2013-07-23 DIAGNOSIS — R9431 Abnormal electrocardiogram [ECG] [EKG]: Secondary | ICD-10-CM | POA: Diagnosis not present

## 2013-07-23 DIAGNOSIS — F3289 Other specified depressive episodes: Secondary | ICD-10-CM | POA: Diagnosis not present

## 2013-07-24 ENCOUNTER — Encounter: Payer: Self-pay | Admitting: Family Medicine

## 2013-07-24 ENCOUNTER — Ambulatory Visit (INDEPENDENT_AMBULATORY_CARE_PROVIDER_SITE_OTHER): Payer: Medicare Other | Admitting: Family Medicine

## 2013-07-24 VITALS — BP 148/90 | HR 86

## 2013-07-24 DIAGNOSIS — H539 Unspecified visual disturbance: Secondary | ICD-10-CM

## 2013-07-24 DIAGNOSIS — J3489 Other specified disorders of nose and nasal sinuses: Secondary | ICD-10-CM

## 2013-07-24 DIAGNOSIS — F430 Acute stress reaction: Secondary | ICD-10-CM

## 2013-07-24 DIAGNOSIS — R0981 Nasal congestion: Secondary | ICD-10-CM

## 2013-07-24 DIAGNOSIS — C549 Malignant neoplasm of corpus uteri, unspecified: Secondary | ICD-10-CM

## 2013-07-24 DIAGNOSIS — C541 Malignant neoplasm of endometrium: Secondary | ICD-10-CM

## 2013-07-24 NOTE — Progress Notes (Signed)
Subjective:    Patient ID: Jennifer [name], female    DOB: 01/25/1966, 47 y.o.   MRN: [id number]  HPI [name] to ED yesterday.  Went to group therapy yesterday and had to walk out and felt overwhelmed. Still working on anger isues.Then went to the ED and talked to the A team.   Seh has been really stressed out.  Sister in ICU in [place] for sepsis from MRSA,  and she can't visit her so worried.  Brother recently had an MI, and he is recovering well. Her sister is very ill and this is the sibling that she is most close to..   Having some sinus congestion and HA. Feels like her eyes are not aligned in picture.  Several family members have had bronchitis. She has had some thickness in her throat but not cough.    Endometrial cancer - has surgery schedule for next week and is really nervous about it.    Visual disturbance-she's having a drawing sensation in her right eye. She's noticed in  Stat her pupils do not seem to be symmetric. She has had some blurry vision at times and has been having headaches on top of her head. Her last eye exam was several years ago. She does wear lenses and has a history of chronic dry eye syndrome as well.  Review of Systems     Objective:   Physical Exam  Constitutional: She is oriented to person, place, and time. She appears well-developed and well-nourished.  HENT:  Head: Normocephalic and atraumatic.  Right Ear: External ear normal.  Left Ear: External ear normal.  Nose: Nose normal.  Mouth/Throat: Oropharynx is clear and moist.  TMs and canals are clear.   Eyes: Conjunctivae and EOM are normal. Pupils are equal, round, and reactive to light.  Neck: Neck supple. No thyromegaly present.  Cardiovascular: Normal rate, regular rhythm and normal heart sounds.   Pulmonary/Chest: Effort normal and breath sounds normal. She has no wheezes.  Lymphadenopathy:    She has no cervical adenopathy.  Neurological: She is alert and oriented to person, place, and time.   Skin: Skin is warm and dry.  Psychiatric: She has a normal mood and affect.          Assessment & Plan:  Endometrial cancer - Surgery schedule for next week . She was fearful that she may have to cancel her appointment if her sister does not do well.  Sinus congestion - at this point I do not think she has a bacterial infection. No fevers chills sweats or severe congestion. Cyst mild. Continue with nasal saline and humidifier. If she gets worse then please call. She doesn't do well on antibiotics but always with this in mind when we do need to treat her.  Acute situtaional stress. -She's very clearly overwhelmed. I wish you the best as far as her sister is concerned. Hopefully she will recover. It sounds like she may have become septic and is still in the intensive care unit, intubated. She is also interested in possibly finding a new counselor. Certainly we can work on this. I do not have anyone specifically to recommend her in the [facility] area. If she finds them when she is interested in a be happy to place a referral for her. I think we can work on this after she has her surgery.  Visual disturbance-her vision was normal with glasses. She's on a drawing sensation in the right eye as well as some headaches. Her  last eye exam was several years ago. We called and got her an appointment at [facility] in [facility] for January 14. We gave her the information before she left today. I did remind her I think it is important for her to have a full eye evaluation.   Time spent 35 minutes, greater than 50% spent counseling about her stress congestion and visual changes.

## 2013-07-25 ENCOUNTER — Encounter (HOSPITAL_BASED_OUTPATIENT_CLINIC_OR_DEPARTMENT_OTHER): Payer: Self-pay | Admitting: Emergency Medicine

## 2013-07-25 ENCOUNTER — Emergency Department (HOSPITAL_BASED_OUTPATIENT_CLINIC_OR_DEPARTMENT_OTHER): Payer: Medicare Other

## 2013-07-25 ENCOUNTER — Emergency Department (HOSPITAL_BASED_OUTPATIENT_CLINIC_OR_DEPARTMENT_OTHER)
Admission: EM | Admit: 2013-07-25 | Discharge: 2013-07-25 | Disposition: A | Discharge status: Home / Self Care | Payer: Medicare Other | Attending: Emergency Medicine | Admitting: Emergency Medicine

## 2013-07-25 ENCOUNTER — Other Ambulatory Visit: Payer: Self-pay

## 2013-07-25 DIAGNOSIS — Z8639 Personal history of other endocrine, nutritional and metabolic disease: Secondary | ICD-10-CM | POA: Insufficient documentation

## 2013-07-25 DIAGNOSIS — Z8742 Personal history of other diseases of the female genital tract: Secondary | ICD-10-CM | POA: Insufficient documentation

## 2013-07-25 DIAGNOSIS — G4730 Sleep apnea, unspecified: Secondary | ICD-10-CM | POA: Diagnosis not present

## 2013-07-25 DIAGNOSIS — Z8614 Personal history of Methicillin resistant Staphylococcus aureus infection: Secondary | ICD-10-CM | POA: Insufficient documentation

## 2013-07-25 DIAGNOSIS — Z95818 Presence of other cardiac implants and grafts: Secondary | ICD-10-CM | POA: Insufficient documentation

## 2013-07-25 DIAGNOSIS — I10 Essential (primary) hypertension: Secondary | ICD-10-CM | POA: Diagnosis not present

## 2013-07-25 DIAGNOSIS — Z9981 Dependence on supplemental oxygen: Secondary | ICD-10-CM | POA: Insufficient documentation

## 2013-07-25 DIAGNOSIS — Z87891 Personal history of nicotine dependence: Secondary | ICD-10-CM | POA: Diagnosis not present

## 2013-07-25 DIAGNOSIS — Z8669 Personal history of other diseases of the nervous system and sense organs: Secondary | ICD-10-CM | POA: Insufficient documentation

## 2013-07-25 DIAGNOSIS — Z862 Personal history of diseases of the blood and blood-forming organs and certain disorders involving the immune mechanism: Secondary | ICD-10-CM | POA: Diagnosis not present

## 2013-07-25 DIAGNOSIS — K219 Gastro-esophageal reflux disease without esophagitis: Secondary | ICD-10-CM | POA: Diagnosis not present

## 2013-07-25 DIAGNOSIS — J45909 Unspecified asthma, uncomplicated: Secondary | ICD-10-CM | POA: Diagnosis not present

## 2013-07-25 DIAGNOSIS — Z8542 Personal history of malignant neoplasm of other parts of uterus: Secondary | ICD-10-CM | POA: Insufficient documentation

## 2013-07-25 DIAGNOSIS — R0789 Other chest pain: Secondary | ICD-10-CM | POA: Diagnosis not present

## 2013-07-25 DIAGNOSIS — Z8679 Personal history of other diseases of the circulatory system: Secondary | ICD-10-CM | POA: Diagnosis not present

## 2013-07-25 DIAGNOSIS — R079 Chest pain, unspecified: Secondary | ICD-10-CM | POA: Diagnosis not present

## 2013-07-25 DIAGNOSIS — Z8659 Personal history of other mental and behavioral disorders: Secondary | ICD-10-CM | POA: Insufficient documentation

## 2013-07-25 DIAGNOSIS — F411 Generalized anxiety disorder: Secondary | ICD-10-CM | POA: Insufficient documentation

## 2013-07-25 DIAGNOSIS — Z880 Allergy status to penicillin: Secondary | ICD-10-CM | POA: Insufficient documentation

## 2013-07-25 DIAGNOSIS — Z79899 Other long term (current) drug therapy: Secondary | ICD-10-CM | POA: Insufficient documentation

## 2013-07-25 DIAGNOSIS — F419 Anxiety disorder, unspecified: Secondary | ICD-10-CM

## 2013-07-25 LAB — TROPONIN I: Troponin I: <0.3 ng/mL (ref <0.30)

## 2013-07-25 MED ORDER — ZOLPIDEM TARTRATE 5 MG PO TABS: 5.0000 mg | ORAL_TABLET | Freq: Every evening | ORAL | Status: DC | PRN

## 2013-07-25 NOTE — ED Notes (Signed)
Patient transported to X-ray 
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
## 2013-07-25 NOTE — ED Notes (Signed)
Pt c/o URi symptoms x 2 days, seen by PMD yesterday for same

## 2013-07-28 ENCOUNTER — Encounter: Payer: Self-pay | Admitting: *Deleted

## 2013-07-28 NOTE — Progress Notes (Signed)
[facility] Clinical Social Work  Clinical Social Work and Art therapist, GYN NP, contacted patient at home today to "check in" prior to surgery.  The patient had questions regarding preparation and recovery from surgery- NP Cross reviewed instructions with patient.  Ms. Stave' main concern at this time is her anxiety and "feeling so overwhelmed".  Patient also reported family issues at this time, her sister is currently in the ICU in [place].  CSW provided brief emotional support and encouraged patient to utilize her existing coping skills to help cope with anxiety surrounding surgery.  CSW also made referral to [employer] for mentorship program.  Kathrin Penner, MSW, LCSW, OSW-C Clinical Social Worker [facility] [phone]

## 2013-07-29 ENCOUNTER — Encounter (HOSPITAL_COMMUNITY)
Admission: RE | Disposition: A | Discharge status: Home / Self Care | Payer: Self-pay | Source: Ambulatory Visit | Attending: Obstetrics & Gynecology

## 2013-07-29 ENCOUNTER — Encounter (HOSPITAL_COMMUNITY): Payer: Medicare Other | Admitting: Anesthesiology

## 2013-07-29 ENCOUNTER — Encounter (HOSPITAL_COMMUNITY): Payer: Self-pay | Admitting: *Deleted

## 2013-07-29 ENCOUNTER — Ambulatory Visit (HOSPITAL_COMMUNITY): Payer: Medicare Other | Admitting: Anesthesiology

## 2013-07-29 ENCOUNTER — Inpatient Hospital Stay (HOSPITAL_COMMUNITY)
Admission: RE | Admit: 2013-07-29 | Discharge: 2013-07-30 | DRG: 741 | Disposition: A | Discharge status: Home / Self Care | Payer: Medicare Other | Source: Ambulatory Visit | Attending: Obstetrics & Gynecology | Admitting: Obstetrics & Gynecology

## 2013-07-29 DIAGNOSIS — I10 Essential (primary) hypertension: Secondary | ICD-10-CM | POA: Diagnosis present

## 2013-07-29 DIAGNOSIS — J45909 Unspecified asthma, uncomplicated: Secondary | ICD-10-CM | POA: Diagnosis present

## 2013-07-29 DIAGNOSIS — Z833 Family history of diabetes mellitus: Secondary | ICD-10-CM

## 2013-07-29 DIAGNOSIS — C541 Malignant neoplasm of endometrium: Secondary | ICD-10-CM

## 2013-07-29 DIAGNOSIS — Z8249 Family history of ischemic heart disease and other diseases of the circulatory system: Secondary | ICD-10-CM

## 2013-07-29 DIAGNOSIS — E785 Hyperlipidemia, unspecified: Secondary | ICD-10-CM | POA: Diagnosis not present

## 2013-07-29 DIAGNOSIS — Z91010 Allergy to peanuts: Secondary | ICD-10-CM | POA: Diagnosis not present

## 2013-07-29 DIAGNOSIS — G4730 Sleep apnea, unspecified: Secondary | ICD-10-CM | POA: Diagnosis present

## 2013-07-29 DIAGNOSIS — F4310 Post-traumatic stress disorder, unspecified: Secondary | ICD-10-CM | POA: Diagnosis present

## 2013-07-29 DIAGNOSIS — Z87891 Personal history of nicotine dependence: Secondary | ICD-10-CM | POA: Diagnosis not present

## 2013-07-29 DIAGNOSIS — Z888 Allergy status to other drugs, medicaments and biological substances status: Secondary | ICD-10-CM | POA: Diagnosis not present

## 2013-07-29 DIAGNOSIS — C549 Malignant neoplasm of corpus uteri, unspecified: Secondary | ICD-10-CM | POA: Diagnosis not present

## 2013-07-29 DIAGNOSIS — Z880 Allergy status to penicillin: Secondary | ICD-10-CM

## 2013-07-29 DIAGNOSIS — M542 Cervicalgia: Secondary | ICD-10-CM | POA: Diagnosis not present

## 2013-07-29 DIAGNOSIS — K219 Gastro-esophageal reflux disease without esophagitis: Secondary | ICD-10-CM | POA: Diagnosis not present

## 2013-07-29 HISTORY — DX: Malignant neoplasm of endometrium: C54.1

## 2013-07-29 HISTORY — PX: ROBOTIC ASSISTED TOTAL HYSTERECTOMY WITH BILATERAL SALPINGO OOPHERECTOMY: SHX6086

## 2013-07-29 LAB — ABO/RH: ABO/RH(D): A POS

## 2013-07-29 LAB — TYPE AND SCREEN
ABO/RH(D): A POS
Antibody Screen: NEGATIVE

## 2013-07-29 SURGERY — ROBOTIC ASSISTED TOTAL HYSTERECTOMY WITH BILATERAL SALPINGO OOPHORECTOMY
Anesthesia: General | Site: Abdomen

## 2013-07-29 MED ORDER — LACTATED RINGERS IV SOLN: INTRAVENOUS | Status: DC

## 2013-07-29 MED ORDER — ACETAMINOPHEN 10 MG/ML IV SOLN
1000.0000 mg | Freq: Once | INTRAVENOUS | Status: AC
  Administered 2013-07-29: 1000 mg via INTRAVENOUS
  Filled 2013-07-29: qty 100

## 2013-07-29 MED ORDER — HYDROMORPHONE HCL PF 2 MG/ML IJ SOLN
INTRAMUSCULAR | Status: AC
  Filled 2013-07-29: qty 1

## 2013-07-29 MED ORDER — ONDANSETRON HCL 4 MG/2ML IJ SOLN
INTRAMUSCULAR | Status: DC | PRN
  Administered 2013-07-29: 4 mg via INTRAVENOUS

## 2013-07-29 MED ORDER — LIDOCAINE HCL (CARDIAC) 20 MG/ML IV SOLN
INTRAVENOUS | Status: AC
  Filled 2013-07-29: qty 5

## 2013-07-29 MED ORDER — HYDROMORPHONE HCL PF 1 MG/ML IJ SOLN: 0.5000 mg | INTRAMUSCULAR | Status: DC | PRN

## 2013-07-29 MED ORDER — SUCCINYLCHOLINE CHLORIDE 20 MG/ML IJ SOLN
INTRAMUSCULAR | Status: AC
  Filled 2013-07-29: qty 1

## 2013-07-29 MED ORDER — HYDRALAZINE HCL 20 MG/ML IJ SOLN
INTRAMUSCULAR | Status: DC | PRN
  Administered 2013-07-29: 2 mg via INTRAVENOUS

## 2013-07-29 MED ORDER — ONDANSETRON 8 MG/NS 50 ML IVPB
8.0000 mg | Freq: Once | INTRAVENOUS | Status: DC | PRN
  Filled 2013-07-29: qty 8

## 2013-07-29 MED ORDER — ACETAMINOPHEN 500 MG PO TABS
1000.0000 mg | ORAL_TABLET | Freq: Four times a day (QID) | ORAL | Status: DC
  Filled 2013-07-29 (×4): qty 2

## 2013-07-29 MED ORDER — ROCURONIUM BROMIDE 100 MG/10ML IV SOLN
INTRAVENOUS | Status: AC
  Filled 2013-07-29: qty 1

## 2013-07-29 MED ORDER — ENOXAPARIN SODIUM 40 MG/0.4ML ~~LOC~~ SOLN
40.0000 mg | SUBCUTANEOUS | Status: AC
  Administered 2013-07-29: 40 mg via SUBCUTANEOUS
  Filled 2013-07-29: qty 0.4

## 2013-07-29 MED ORDER — HYDROMORPHONE HCL PF 1 MG/ML IJ SOLN
INTRAMUSCULAR | Status: DC | PRN
  Administered 2013-07-29 (×6): 0.5 mg via INTRAVENOUS

## 2013-07-29 MED ORDER — LACTATED RINGERS IR SOLN
Status: DC | PRN
  Administered 2013-07-29: 1000 mL

## 2013-07-29 MED ORDER — GLYCOPYRROLATE 0.2 MG/ML IJ SOLN
INTRAMUSCULAR | Status: AC
  Filled 2013-07-29: qty 4

## 2013-07-29 MED ORDER — CLINDAMYCIN PHOSPHATE 900 MG/50ML IV SOLN
900.0000 mg | Freq: Once | INTRAVENOUS | Status: AC
  Administered 2013-07-29: 900 mg via INTRAVENOUS
  Filled 2013-07-29: qty 50

## 2013-07-29 MED ORDER — HYDROMORPHONE HCL PF 1 MG/ML IJ SOLN: 0.2500 mg | INTRAMUSCULAR | Status: DC | PRN

## 2013-07-29 MED ORDER — HYDROMORPHONE HCL PF 1 MG/ML IJ SOLN
INTRAMUSCULAR | Status: AC
  Filled 2013-07-29: qty 1

## 2013-07-29 MED ORDER — ONDANSETRON HCL 4 MG PO TABS
4.0000 mg | ORAL_TABLET | Freq: Four times a day (QID) | ORAL | Status: DC | PRN
  Filled 2013-07-29: qty 1

## 2013-07-29 MED ORDER — DEXTROSE 5 % IV SOLN
1.0000 g | Freq: Once | INTRAVENOUS | Status: AC
  Administered 2013-07-29: 1 g via INTRAVENOUS
  Filled 2013-07-29: qty 1

## 2013-07-29 MED ORDER — LIDOCAINE HCL (CARDIAC) 20 MG/ML IV SOLN
INTRAVENOUS | Status: DC | PRN
  Administered 2013-07-29: 80 mg via INTRAVENOUS

## 2013-07-29 MED ORDER — CEFAZOLIN SODIUM-DEXTROSE 2-3 GM-% IV SOLR: 2.0000 g | INTRAVENOUS | Status: DC

## 2013-07-29 MED ORDER — NEOSTIGMINE METHYLSULFATE 1 MG/ML IJ SOLN
INTRAMUSCULAR | Status: AC
  Filled 2013-07-29: qty 10

## 2013-07-29 MED ORDER — ONDANSETRON HCL 4 MG/2ML IJ SOLN
4.0000 mg | Freq: Four times a day (QID) | INTRAMUSCULAR | Status: DC | PRN
  Administered 2013-07-29 – 2013-07-30 (×2): 4 mg via INTRAVENOUS
  Filled 2013-07-29 (×2): qty 2

## 2013-07-29 MED ORDER — PROPOFOL 10 MG/ML IV BOLUS
INTRAVENOUS | Status: AC
  Filled 2013-07-29: qty 20

## 2013-07-29 MED ORDER — DIPHENHYDRAMINE HCL 50 MG/ML IJ SOLN
INTRAMUSCULAR | Status: AC
  Filled 2013-07-29: qty 1

## 2013-07-29 MED ORDER — ROCURONIUM BROMIDE 100 MG/10ML IV SOLN
INTRAVENOUS | Status: DC | PRN
  Administered 2013-07-29: 10 mg via INTRAVENOUS
  Administered 2013-07-29: 40 mg via INTRAVENOUS
  Administered 2013-07-29 (×2): 10 mg via INTRAVENOUS

## 2013-07-29 MED ORDER — NEOSTIGMINE METHYLSULFATE 1 MG/ML IJ SOLN
INTRAMUSCULAR | Status: DC | PRN
  Administered 2013-07-29: 5 mg via INTRAVENOUS

## 2013-07-29 MED ORDER — STERILE WATER FOR IRRIGATION IR SOLN
Status: DC | PRN
  Administered 2013-07-29: 3000 mL

## 2013-07-29 MED ORDER — FAMOTIDINE 20 MG PO TABS
20.0000 mg | ORAL_TABLET | Freq: Every day | ORAL | Status: DC
  Filled 2013-07-29 (×2): qty 1

## 2013-07-29 MED ORDER — CLINDAMYCIN PHOSPHATE 900 MG/50ML IV SOLN
INTRAVENOUS | Status: AC
  Filled 2013-07-29: qty 50

## 2013-07-29 MED ORDER — ONDANSETRON HCL 4 MG/2ML IJ SOLN
INTRAMUSCULAR | Status: AC
  Filled 2013-07-29: qty 2

## 2013-07-29 MED ORDER — DIPHENHYDRAMINE HCL 50 MG/ML IJ SOLN
INTRAMUSCULAR | Status: DC | PRN
  Administered 2013-07-29 (×2): 12.5 mg via INTRAVENOUS

## 2013-07-29 MED ORDER — KCL IN DEXTROSE-NACL 20-5-0.45 MEQ/L-%-% IV SOLN
INTRAVENOUS | Status: DC
  Administered 2013-07-29: 75 mL/h via INTRAVENOUS
  Administered 2013-07-30: 04:00:00 via INTRAVENOUS
  Filled 2013-07-29 (×2): qty 1000

## 2013-07-29 MED ORDER — PROPOFOL 10 MG/ML IV BOLUS
INTRAVENOUS | Status: DC | PRN
  Administered 2013-07-29: 200 mg via INTRAVENOUS

## 2013-07-29 MED ORDER — HYDRALAZINE HCL 20 MG/ML IJ SOLN
INTRAMUSCULAR | Status: AC
  Filled 2013-07-29: qty 1

## 2013-07-29 MED ORDER — GLYCOPYRROLATE 0.2 MG/ML IJ SOLN
INTRAMUSCULAR | Status: DC | PRN
  Administered 2013-07-29: 0.6 mg via INTRAVENOUS

## 2013-07-29 MED ORDER — HYDROMORPHONE HCL 2 MG PO TABS: 2.0000 mg | ORAL_TABLET | ORAL | Status: DC | PRN

## 2013-07-29 MED ORDER — LACTATED RINGERS IV SOLN
INTRAVENOUS | Status: DC | PRN
  Administered 2013-07-29 (×2): via INTRAVENOUS

## 2013-07-29 MED ORDER — ZOLPIDEM TARTRATE 5 MG PO TABS: 5.0000 mg | ORAL_TABLET | Freq: Every evening | ORAL | Status: DC | PRN

## 2013-07-29 MED ORDER — FLUTICASONE PROPIONATE 50 MCG/ACT NA SUSP
1.0000 | Freq: Every day | NASAL | Status: DC
  Filled 2013-07-29: qty 16

## 2013-07-29 SURGICAL SUPPLY — 57 items
APL SKNCLS STERI-STRIP NONHPOA (GAUZE/BANDAGES/DRESSINGS) ×1
BAG SPEC RTRVL LRG 6X4 10 (ENDOMECHANICALS)
BENZOIN TINCTURE PRP APPL 2/3 (GAUZE/BANDAGES/DRESSINGS) ×2 IMPLANT
CANNULA SEAL DVNC (CANNULA) IMPLANT
CANNULA SEALS DA VINCI (CANNULA) ×1
CHLORAPREP W/TINT 26ML (MISCELLANEOUS) ×2 IMPLANT
CORD HIGH FREQUENCY UNIPOLAR (ELECTROSURGICAL) ×2 IMPLANT
CORDS BIPOLAR (ELECTRODE) ×2 IMPLANT
COVER MAYO STAND STRL (DRAPES) ×2 IMPLANT
COVER SURGICAL LIGHT HANDLE (MISCELLANEOUS) ×2 IMPLANT
COVER TIP SHEARS 8 DVNC (MISCELLANEOUS) ×1 IMPLANT
COVER TIP SHEARS 8MM DA VINCI (MISCELLANEOUS) ×1
DRAPE LG THREE QUARTER DISP (DRAPES) ×4 IMPLANT
DRAPE SURG IRRIG POUCH 19X23 (DRAPES) ×2 IMPLANT
DRAPE TABLE BACK 44X90 PK DISP (DRAPES) ×4 IMPLANT
DRAPE UTILITY XL STRL (DRAPES) ×2 IMPLANT
DRAPE WARM FLUID 44X44 (DRAPE) ×2 IMPLANT
DRSG TEGADERM 2-3/8X2-3/4 SM (GAUZE/BANDAGES/DRESSINGS) ×1 IMPLANT
DRSG TEGADERM 4X4.75 (GAUZE/BANDAGES/DRESSINGS) ×2 IMPLANT
DRSG TEGADERM 6X8 (GAUZE/BANDAGES/DRESSINGS) ×4 IMPLANT
ELECT REM PT RETURN 9FT ADLT (ELECTROSURGICAL) ×2
ELECTRODE REM PT RTRN 9FT ADLT (ELECTROSURGICAL) ×1 IMPLANT
GAUZE SPONGE 2X2 8PLY STRL LF (GAUZE/BANDAGES/DRESSINGS) ×2 IMPLANT
GLOVE BIO SURGEON STRL SZ 6.5 (GLOVE) ×4 IMPLANT
GLOVE BIO SURGEON STRL SZ7.5 (GLOVE) ×2 IMPLANT
GLOVE BIOGEL PI IND STRL 7.0 (GLOVE) ×2 IMPLANT
GLOVE BIOGEL PI INDICATOR 7.0 (GLOVE) ×2
GOWN PREVENTION PLUS LG XLONG (DISPOSABLE) ×6 IMPLANT
GOWN PREVENTION PLUS XLARGE (GOWN DISPOSABLE) ×4 IMPLANT
HOLDER FOLEY CATH W/STRAP (MISCELLANEOUS) ×2 IMPLANT
KIT ACCESSORY DA VINCI DISP (KITS) ×1
KIT ACCESSORY DVNC DISP (KITS) ×1 IMPLANT
MANIPULATOR UTERINE 4.5 ZUMI (MISCELLANEOUS) ×2 IMPLANT
OCCLUDER COLPOPNEUMO (BALLOONS) ×2 IMPLANT
PENCIL BUTTON HOLSTER BLD 10FT (ELECTRODE) ×1 IMPLANT
POUCH SPECIMEN RETRIEVAL 10MM (ENDOMECHANICALS) ×2 IMPLANT
SET TUBE IRRIG SUCTION NO TIP (IRRIGATION / IRRIGATOR) ×2 IMPLANT
SHEET LAVH (DRAPES) ×2 IMPLANT
SOLUTION ELECTROLUBE (MISCELLANEOUS) ×2 IMPLANT
SPONGE GAUZE 2X2 STER 10/PKG (GAUZE/BANDAGES/DRESSINGS)
SPONGE LAP 18X18 X RAY DECT (DISPOSABLE) IMPLANT
STRIP CLOSURE SKIN 1/2X4 (GAUZE/BANDAGES/DRESSINGS) ×2 IMPLANT
SUT VIC AB 0 CT1 27 (SUTURE) ×6
SUT VIC AB 0 CT1 27XBRD ANTBC (SUTURE) ×3 IMPLANT
SUT VIC AB 4-0 PS2 27 (SUTURE) ×4 IMPLANT
SUT VICRYL 0 UR6 27IN ABS (SUTURE) ×1 IMPLANT
SYR 50ML LL SCALE MARK (SYRINGE) ×2 IMPLANT
SYR BULB IRRIGATION 50ML (SYRINGE) IMPLANT
TOWEL OR 17X26 10 PK STRL BLUE (TOWEL DISPOSABLE) ×4 IMPLANT
TRAP SPECIMEN MUCOUS 40CC (MISCELLANEOUS) ×1 IMPLANT
TRAY FOLEY CATH 14FRSI W/METER (CATHETERS) ×2 IMPLANT
TRAY LAP CHOLE (CUSTOM PROCEDURE TRAY) ×2 IMPLANT
TROCAR 12M 150ML BLUNT (TROCAR) ×2 IMPLANT
TROCAR BLADELESS OPT 5 100 (ENDOMECHANICALS) ×2 IMPLANT
TROCAR XCEL 12X100 BLDLESS (ENDOMECHANICALS) ×2 IMPLANT
TUBING INSUFFLATION 10FT LAP (TUBING) ×2 IMPLANT
WATER STERILE IRR 1500ML POUR (IV SOLUTION) ×2 IMPLANT

## 2013-07-29 NOTE — Transfer of Care (Signed)
Immediate Anesthesia Transfer of Care Note  Patient: Jennifer [name]  Procedure(s) Performed: Procedure(s): ROBOTIC ASSISTED TOTAL HYSTERECTOMY WITH BILATERAL SALPINGO OOPHORECTOMY  (N/A)  Patient Location: PACU  Anesthesia Type:General  Level of Consciousness: Patient easily awoken, sedated, comfortable, cooperative, following commands, responds to stimulation.   Airway & Oxygen Therapy: Patient spontaneously breathing, ventilating well, oxygen via simple oxygen mask.  Post-op Assessment: Report given to PACU RN, vital signs reviewed and stable, moving all extremities.   Post vital signs: Reviewed and stable.  Complications: No apparent anesthesia complications

## 2013-07-29 NOTE — Op Note (Signed)
PATIENT: Jennifer [name] DATE OF BIRTH: 08/01/1966 ENCOUNTER DATE: 07/29/13   Preop Diagnosis: Grade 1 endometrioid adenocarcinoma.   Postoperative Diagnosis: same.   Surgery: Total robotic hysterectomy bilateral salpingo-oophorectomy right pelvic lymph node dissection.   Surgeons:  Bernita Buffy. Duard Brady, MD; Antionette Char, MD   Anesthesia: General   Estimated blood loss: 25 ml   IVF: 1200 ml   Urine output: 300 ml   Complications: None   Pathology: Uterus, cervix, bilateral tubes and ovaries  Operative findings: Normal anatomy, small fibroid on the uterus. Sigmoid colon adherent to left pelvic side wall.  Procedure: The patient was identified in the preoperative holding area. Informed consent was signed on the chart. Patient was seen history was reviewed and exam was performed.   The patient was then taken to the operating room and placed in the supine position with SCD hose on. General anesthesia was then induced without difficulty. She was then placed in the dorsolithotomy position. Her arms were tucked at her side with appropriate precautions on the gel pad.  Shoulder blocks were then placed in the usual fashion with appropriate precautions. A OG-tube was placed to suction. First timeout was performed to confirm the patient, procedure, antibiotic, allergy status, estimated blood loss and OR time. The perineum was then prepped in the usual fashion with Betadine. A 14 French Foley was inserted into the bladder under sterile conditions. A sterile speculum was placed in the vagina. The cervix was without lesions. The cervix was grasped with a single-tooth tenaculum. The dilator without difficulty. A ZUMI with a small Koe ring was placed without difficulty. The abdomen was then prepped with 1 Chlor prep sponge per protocol.   Patient was then draped after the prep was dried. Second timeout was performed to confirm the above. After again confirming OG tube placement and it was to suction.  A stab-wound was made in left upper quadrant 2 cm below the costal margin on the left in the midclavicular line. A 5 mm operative report was used to assure intra-abdominal placement. The abdomen was insufflated. At this point all points during the procedure the patient's intra-abdominal pressure was not increased over 15 mm of mercury. After insufflation was complete, the patient was placed in deep Trendelenburg position. 25 cm above the pubic symphysis that area was marked the camera port. Bilateral robotic ports were marked 10 cm from the midline incision at approximately 5 angle. A left lower quadrant port was incised 2 cm above and medial to the ACIS. Under direct visualization each of the trochars was placed into the abdomen. The small bowel was folded on its mesentery to allow visualization to the pelvis. The 5 mm LUQ port was then converted to a 10/12 port under direct visualization.  After assuring adequate visualization, the robot was then docked in the usual fashion. Under direct visualization the robotic instruments replaced.   The adhesions of the sigmoid colon to the left pelvic sidewall and to the left adnexa were taken down using sharp dissection. The round ligament on the patient's right side was transected with monopolar cautery. The anterior and posterior leaves of the broad ligament were then taken down in the usual fashion. The ureter was identified on the medial leaf of the broad ligament. A window was made between the IP and the ureter. The IP was coagulated with bipolar cautery and transected. The posterior leaf of the broad ligament was taken down to the level of the KOH ring. The bladder flap was created using meticulous  dissection and pinpoint cautery. The uterine vessels were coagulated with bipolar cautery. The uterine vessels were then transected and the C loop was created. The same procedure was performed on the patient's left side.   The pneumo-occulder in the vagina was then  insufflated. The colpotomy was then created in the usual fashion. The specimen was then delivered to the vagina and sent for frozen section. All specimens were delivered to the vagina. The vaginal cuff was closed with a running 0 Vicryl on CT 1 suture. At this point frozen section returned as minimal myometrial invasion of grade 1 lesion. The decision was made to stop the procedure as no lymphadenectomy was indicated and the patient was adamant that she only wanted nodal dissection if there was > 50% myometrial invasion on frozen section.   The abdomen and pelvis were copiously irrigated and noted to be hemostatic. The robotic instruments were removed under direct visualization as were the robotic trochars. The pneumoperitoneum was removed. The patient was then taken out of the Trendelenburg position. Using of 0 Vicryl on a UR 6 needle the midline port fascia was closed after being grasped with allis clamps. The subcutaneous tissues of the port in the left upper quadrant was reapproximated. The skin was closed using 4-0 Vicryl. Steri-Strips and benzoin were applied. The pneumo occluded balloon was removed from the vagina. The vagina was swabbed and noted to be hemostatic.   All instrument needle and Ray-Tec counts were correct x2. The patient tolerated the procedure well and was taken to the recovery room in stable condition. This is Cleda Mccreedy dictating an operative note on patient Jennifer [name].

## 2013-07-29 NOTE — Interval H&P Note (Signed)
History and Physical Interval Note:  07/29/2013 12:54 PM  Jennifer [name]  has presented today for surgery, with the diagnosis of ENDOMETRIAL CANCER  The various methods of treatment have been discussed with the patient and family. After consideration of risks, benefits and other options for treatment, the patient has consented to  Procedure(s): ROBOTIC ASSISTED TOTAL HYSTERECTOMY WITH BILATERAL SALPINGO OOPHORECTOMY AND POSSIBLE LYMPHADENECTOMY (N/A) as a surgical intervention .  The patient's history has been reviewed, patient examined, no change in status, stable for surgery.  I have reviewed the patient's chart and labs.  Questions were answered to the patient's satisfaction.     Shariq Puig A.

## 2013-07-29 NOTE — Progress Notes (Signed)
Patient still very sleepy but arousable. Patient C/O upper shoulder pain explained to patient it was more that likely post op gas pain and ambulation would help, also that she needed to ambulate anyway since she has not been up since surgery, patient refused. Will continue to monitor. CLum Keas

## 2013-07-29 NOTE — Anesthesia Postprocedure Evaluation (Signed)
  Anesthesia Post-op Note  Patient: Jennifer [name]  Procedure(s) Performed: Procedure(s) (LRB): ROBOTIC ASSISTED TOTAL HYSTERECTOMY WITH BILATERAL SALPINGO OOPHORECTOMY  (N/A)  Patient Location: PACU  Anesthesia Type: General  Level of Consciousness: awake and alert   Airway and Oxygen Therapy: Patient Spontanous Breathing  Post-op Pain: mild  Post-op Assessment: Post-op Vital signs reviewed, Patient's Cardiovascular Status Stable, Respiratory Function Stable, Patent Airway and No signs of Nausea or vomiting  Last Vitals:  Filed Vitals:   07/29/13 1630  BP: 141/86  Pulse: 65  Temp: 36.4 C  Resp: 14    Post-op Vital Signs: stable   Complications: No apparent anesthesia complications
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
## 2013-07-30 ENCOUNTER — Telehealth: Payer: Self-pay | Admitting: Family Medicine

## 2013-07-30 ENCOUNTER — Encounter (HOSPITAL_COMMUNITY): Payer: Self-pay | Admitting: Gynecologic Oncology

## 2013-07-30 LAB — BASIC METABOLIC PANEL
BUN: 6 mg/dL (ref 6–23)
CO2: 24 mEq/L (ref 19–32)
Calcium: 9.1 mg/dL (ref 8.4–10.5)
Chloride: 105 mEq/L (ref 96–112)
Creatinine, Ser: 0.5 mg/dL (ref 0.50–1.10)
GFR calc Af Amer: >90 mL/min (ref >90)
GFR calc non Af Amer: >90 mL/min (ref >90)
Glucose, Bld: 171 mg/dL — ABNORMAL HIGH (ref 70–99)
Potassium: 3.7 mEq/L (ref 3.5–5.1)
Sodium: 138 mEq/L (ref 135–145)

## 2013-07-30 MED ORDER — DOCUSATE SODIUM 50 MG/5ML PO LIQD
50.0000 mg | Freq: Every day | ORAL | Status: DC
  Administered 2013-07-30: 50 mg via ORAL
  Filled 2013-07-30: qty 10

## 2013-07-30 MED ORDER — HYDROMORPHONE HCL 2 MG PO TABS: 1.0000 mg | ORAL_TABLET | Freq: Four times a day (QID) | ORAL | Status: DC | PRN

## 2013-07-30 MED ORDER — ACETAMINOPHEN 10 MG/ML IV SOLN
1000.0000 mg | Freq: Four times a day (QID) | INTRAVENOUS | Status: DC
  Administered 2013-07-30: 1000 mg via INTRAVENOUS
  Filled 2013-07-30 (×2): qty 100

## 2013-07-30 MED ORDER — ACETAMINOPHEN 80 MG PO CHEW
320.0000 mg | CHEWABLE_TABLET | Freq: Four times a day (QID) | ORAL | Status: DC
  Administered 2013-07-30: 80 mg via ORAL
  Filled 2013-07-30 (×4): qty 7

## 2013-07-30 MED ORDER — ONDANSETRON 4 MG PO TBDP
4.0000 mg | ORAL_TABLET | Freq: Three times a day (TID) | ORAL | Status: DC | PRN
  Administered 2013-07-30: 4 mg via ORAL
  Filled 2013-07-30 (×2): qty 1

## 2013-07-30 MED ORDER — SIMETHICONE 80 MG PO CHEW
80.0000 mg | CHEWABLE_TABLET | Freq: Four times a day (QID) | ORAL | Status: DC | PRN
  Administered 2013-07-30: 80 mg via ORAL
  Filled 2013-07-30: qty 1

## 2013-07-30 NOTE — Discharge Summary (Signed)
Physician Discharge Summary  Patient ID: Jennifer [name] MRN: [id number] DOB/AGE: 12/06/1965 47 y.o.  Admit date: 07/29/2013 Discharge date: 07/30/2013  Admission Diagnoses: Endometrial cancer  Discharge Diagnoses:  Principal Problem:   Endometrial cancer  Discharged Condition:  The patient is in good condition and stable for discharge.    Hospital Course: On 07/29/2013, the patient underwent the following: Procedure(s):  ROBOTIC ASSISTED TOTAL HYSTERECTOMY WITH BILATERAL SALPINGO OOPHORECTOMY.  The postoperative course was uneventful.  She was discharged to home on postoperative day 1 tolerating a regular diet.  Consults: None  Significant Diagnostic Studies: None  Treatments: surgery: see above  Discharge Exam: Blood pressure 104/72, pulse 78, temperature 98.4 F (36.9 C), temperature source Oral, resp. rate 20, height 5\' 5"  (1.651 m), weight 185 lb (83.915 kg), SpO2 99.00%. General appearance: alert, cooperative and no distress Resp: clear to auscultation bilaterally Cardio: regular rate and rhythm, S1, S2 normal, no murmur, click, rub or gallop GI: soft, non-tender; bowel sounds normal; no masses,  no organomegaly Extremities: extremities normal, atraumatic, no cyanosis or edema Incision/Wound: Lap sites with steri strips clean, dry, and intact  Disposition: 01-Home or Self Care      Discharge Orders   Future Appointments Provider Department Dept Phone   09/17/2013 2:30 PM Paola A. Duard Brady, MD [facility] [phone]   Future Orders Complete By Expires   Call MD for:  difficulty breathing, headache or visual disturbances  As directed    Call MD for:  extreme fatigue  As directed    Call MD for:  hives  As directed    Call MD for:  persistant dizziness or light-headedness  As directed    Call MD for:  persistant nausea and vomiting  As directed    Call MD for:  redness, tenderness, or signs of infection (pain, swelling,  redness, odor or green/yellow discharge around incision site)  As directed    Call MD for:  severe uncontrolled pain  As directed    Call MD for:  temperature >100.4  As directed    Diet - low sodium heart healthy  As directed    Driving Restrictions  As directed    Comments:     No driving for 1 week.  Do not take narcotics and drive.   Increase activity slowly  As directed    Lifting restrictions  As directed    Comments:     No lifting greater than 10 lbs.   Sexual Activity Restrictions  As directed    Comments:     No sexual activity, nothing in the vagina, for 8 weeks.       Medication List         acetaminophen 160 MG chewable tablet  Commonly known as:  TYLENOL  Chew 640 mg by mouth every 6 (six) hours as needed for pain.     EPIPEN 2-PAK 0.3 mg/0.3 mL Soaj injection  Generic drug:  EPINEPHrine  Inject 0.3 mg into the muscle as needed.     mometasone 50 MCG/ACT nasal spray  Commonly known as:  NASONEX  Place 2 sprays into the nose daily.     ranitidine 150 MG/10ML syrup  Commonly known as:  ZANTAC  Take by mouth 2 (two) times daily. Pt reported     zolpidem 5 MG tablet  Commonly known as:  AMBIEN  Take 1 tablet (5 mg total) by mouth at bedtime as needed for sleep.       Follow-up Information  Follow up with [facility] A., MD On 09/17/2013. (at 2:30pm at the [facility])    Specialty:  Gynecologic Oncology   Contact information:   [address] [place] [address] [phone]       Greater than thirty minutes were spend for face to face discharge instructions and discharge orders/summary in EPIC.   Signed: Shawana Knoch DEAL 07/30/2013, 12:48 PM

## 2013-07-30 NOTE — Progress Notes (Signed)
Pt given chewable tylenol per order.  After chewing the first tablet pt stated "this is nasty."  Pt told that this is the type of chewable tylenol pharmacy carries by nurse.  Pt asked to see the package after stating that "this doesn't taste like its chewable, my tylenol doesn't taste like that." Pt informed by nurse that the tylenol is a different dosage and brand than her tylenol she takes at home.  Pt then opened her purse and pulled out a bag with a variety of medication in it. Pt pointed out her chewable tylenol. Pt then informed by nurse that she cannot take her own medication and that the nurse will have to take her medications from her and let pharmacy hold then until her discharge.  Pt then became agitated and stated "Nobody is taking anything away from me or I'll get really nasty. You wouldn't know if I take my own or not." Pt then put her medication bag back into her purse.  Nurse then left room and notified Warner Mccreedy NP of current event.  The [facility], Pamala Duffel was also notified of situation and discussed with nurse the situation and steps that need to be taken.  The [facility] will return to unit to speak with the pt when pt is awake to discuss her having her own medications at bedside.

## 2013-07-30 NOTE — Telephone Encounter (Signed)
Called and left message on cell phone. Just let her know that I have she's recovering well from her surgery that she had yesterday and I hope her sisters doing well too. Asked her to give [place] a call at this week if she can just give [place] an update on how she's doing.

## 2013-07-30 NOTE — Progress Notes (Signed)
1 Day Post-Op Procedure(s) (LRB): ROBOTIC ASSISTED TOTAL HYSTERECTOMY WITH BILATERAL SALPINGO OOPHORECTOMY  (N/A)  Subjective: Patient reports "feeling horrible."  Feeling very sleepy this am and throughout the night.  Reporting nausea and dizziness with ambulation.  Moderate pain with movement.  Anxious about going home later today.  Denies chest pain, dyspnea, passing flatus, or having a bowel movement.  Objective: Vital signs in last 24 hours: Temp:  [97.4 F (36.3 C)-98.6 F (37 C)] 98.6 F (37 C) (12/17 0528) Pulse Rate:  [63-84] 81 (12/17 0528) Resp:  [13-18] 18 (12/17 0528) BP: (114-182)/(75-96) 115/76 mmHg (12/17 0528) SpO2:  [96 %-100 %] 98 % (12/17 0528) Weight:  [185 lb (83.915 kg)] 185 lb (83.915 kg) (12/16 1700) Last BM Date: 07/29/13  Intake/Output from previous day: 12/16 0701 - 12/17 0700 In: 2995 [P.O.:510; I.V.:2385; IV Piggyback:100] Out: 3450 [Urine:3425; Blood:25]  Physical Examination: General: alert, cooperative and no distress Resp: clear to auscultation bilaterally Cardio: regular rate and rhythm, S1, S2 normal, no murmur, click, rub or gallop GI: soft, non-tender; bowel sounds normal; no masses,  no organomegaly and incision: lap sites with steri strips clean, dry, and intact Extremities: extremities normal, atraumatic, no cyanosis or edema  Labs:   BUN/Cr/glu/ALT/AST/amyl/lip:  6/0.50/--/--/--/--/-- (12/17 8119)  Assessment: 47 y.o. s/p Procedure(s): ROBOTIC ASSISTED TOTAL HYSTERECTOMY WITH BILATERAL SALPINGO OOPHORECTOMY : stable Pain:  Pain is well-controlled on PRN medications.  Heme:  No active bleeding.  CV: BP and HR stable post-operatively.  GI:  Tolerating po: Yes     FEN:  Stable post-operatively.  Prophylaxis: intermittent pneumatic compression boots.  Plan: Advance diet Encourage ambulation Saline lock IV Encourage IS use, deep breathing, and coughing Continue post-operative plan of care per Dr. Tamela Oddi with discharge  later this afternoon   LOS: 1 day    CROSS, MELISSA DEAL 07/30/2013, 8:40 AM

## 2013-07-30 NOTE — Care Management Note (Signed)
    Page 1 of 1   07/30/2013     10:53:19 AM   CARE MANAGEMENT NOTE 07/30/2013  Patient:  Jennifer [name], Jennifer [name]   Account Number:  [id number]  Date Initiated:  07/30/2013  Documentation initiated by:  Lorenda Ishihara  Subjective/Objective Assessment:   47 yo female admitted s/p robotic TAH, BSO, nodes. PTA lived at home with spouse.     Action/Plan:   Home when stable   Anticipated DC Date:  07/31/2013   Anticipated DC Plan:  HOME/SELF CARE      DC Planning Services  CM consult      Choice offered to / List presented to:             Status of service:  Completed, signed off Medicare Important Message given?   (If response is "NO", the following Medicare IM given date fields will be blank) Date Medicare IM given:   Date Additional Medicare IM given:    Discharge Disposition:  HOME/SELF CARE  Per UR Regulation:  Reviewed for med. necessity/level of care/duration of stay  If discussed at Long Length of Stay Meetings, dates discussed:    Comments:

## 2013-07-31 DIAGNOSIS — Z8673 Personal history of transient ischemic attack (TIA), and cerebral infarction without residual deficits: Secondary | ICD-10-CM | POA: Diagnosis not present

## 2013-07-31 DIAGNOSIS — Z8719 Personal history of other diseases of the digestive system: Secondary | ICD-10-CM | POA: Diagnosis not present

## 2013-07-31 DIAGNOSIS — K2970 Gastritis, unspecified, without bleeding: Secondary | ICD-10-CM | POA: Diagnosis not present

## 2013-07-31 DIAGNOSIS — Z79899 Other long term (current) drug therapy: Secondary | ICD-10-CM | POA: Diagnosis not present

## 2013-07-31 DIAGNOSIS — Z888 Allergy status to other drugs, medicaments and biological substances status: Secondary | ICD-10-CM | POA: Diagnosis not present

## 2013-07-31 DIAGNOSIS — I10 Essential (primary) hypertension: Secondary | ICD-10-CM | POA: Diagnosis not present

## 2013-07-31 DIAGNOSIS — R109 Unspecified abdominal pain: Secondary | ICD-10-CM | POA: Diagnosis not present

## 2013-07-31 DIAGNOSIS — K5900 Constipation, unspecified: Secondary | ICD-10-CM | POA: Diagnosis not present

## 2013-07-31 DIAGNOSIS — R110 Nausea: Secondary | ICD-10-CM | POA: Diagnosis not present

## 2013-07-31 DIAGNOSIS — R1084 Generalized abdominal pain: Secondary | ICD-10-CM | POA: Diagnosis not present

## 2013-08-03 DIAGNOSIS — R110 Nausea: Secondary | ICD-10-CM | POA: Diagnosis not present

## 2013-08-03 DIAGNOSIS — R300 Dysuria: Secondary | ICD-10-CM | POA: Diagnosis not present

## 2013-08-03 DIAGNOSIS — J029 Acute pharyngitis, unspecified: Secondary | ICD-10-CM | POA: Diagnosis not present

## 2013-08-03 DIAGNOSIS — Z79899 Other long term (current) drug therapy: Secondary | ICD-10-CM | POA: Diagnosis not present

## 2013-08-03 DIAGNOSIS — Z881 Allergy status to other antibiotic agents status: Secondary | ICD-10-CM | POA: Diagnosis not present

## 2013-08-03 DIAGNOSIS — Z888 Allergy status to other drugs, medicaments and biological substances status: Secondary | ICD-10-CM | POA: Diagnosis not present

## 2013-08-03 DIAGNOSIS — Z882 Allergy status to sulfonamides status: Secondary | ICD-10-CM | POA: Diagnosis not present

## 2013-08-03 DIAGNOSIS — R0789 Other chest pain: Secondary | ICD-10-CM | POA: Diagnosis not present

## 2013-08-06 ENCOUNTER — Emergency Department (HOSPITAL_BASED_OUTPATIENT_CLINIC_OR_DEPARTMENT_OTHER)
Admission: EM | Admit: 2013-08-06 | Discharge: 2013-08-07 | Disposition: A | Discharge status: Home / Self Care | Payer: Medicare Other | Attending: Emergency Medicine | Admitting: Emergency Medicine

## 2013-08-06 ENCOUNTER — Emergency Department (HOSPITAL_BASED_OUTPATIENT_CLINIC_OR_DEPARTMENT_OTHER): Payer: Medicare Other

## 2013-08-06 ENCOUNTER — Encounter (HOSPITAL_BASED_OUTPATIENT_CLINIC_OR_DEPARTMENT_OTHER): Payer: Self-pay | Admitting: Emergency Medicine

## 2013-08-06 DIAGNOSIS — Z8742 Personal history of other diseases of the female genital tract: Secondary | ICD-10-CM | POA: Insufficient documentation

## 2013-08-06 DIAGNOSIS — Z9981 Dependence on supplemental oxygen: Secondary | ICD-10-CM | POA: Diagnosis not present

## 2013-08-06 DIAGNOSIS — R109 Unspecified abdominal pain: Secondary | ICD-10-CM

## 2013-08-06 DIAGNOSIS — Z9089 Acquired absence of other organs: Secondary | ICD-10-CM | POA: Diagnosis not present

## 2013-08-06 DIAGNOSIS — G8918 Other acute postprocedural pain: Secondary | ICD-10-CM | POA: Insufficient documentation

## 2013-08-06 DIAGNOSIS — R6883 Chills (without fever): Secondary | ICD-10-CM | POA: Insufficient documentation

## 2013-08-06 DIAGNOSIS — Z9851 Tubal ligation status: Secondary | ICD-10-CM | POA: Insufficient documentation

## 2013-08-06 DIAGNOSIS — I10 Essential (primary) hypertension: Secondary | ICD-10-CM | POA: Diagnosis not present

## 2013-08-06 DIAGNOSIS — Z95818 Presence of other cardiac implants and grafts: Secondary | ICD-10-CM | POA: Insufficient documentation

## 2013-08-06 DIAGNOSIS — Z87891 Personal history of nicotine dependence: Secondary | ICD-10-CM | POA: Insufficient documentation

## 2013-08-06 DIAGNOSIS — Z8639 Personal history of other endocrine, nutritional and metabolic disease: Secondary | ICD-10-CM | POA: Diagnosis not present

## 2013-08-06 DIAGNOSIS — Z8739 Personal history of other diseases of the musculoskeletal system and connective tissue: Secondary | ICD-10-CM | POA: Insufficient documentation

## 2013-08-06 DIAGNOSIS — Z8659 Personal history of other mental and behavioral disorders: Secondary | ICD-10-CM | POA: Diagnosis not present

## 2013-08-06 DIAGNOSIS — Z862 Personal history of diseases of the blood and blood-forming organs and certain disorders involving the immune mechanism: Secondary | ICD-10-CM | POA: Insufficient documentation

## 2013-08-06 DIAGNOSIS — Z8542 Personal history of malignant neoplasm of other parts of uterus: Secondary | ICD-10-CM | POA: Diagnosis not present

## 2013-08-06 DIAGNOSIS — K219 Gastro-esophageal reflux disease without esophagitis: Secondary | ICD-10-CM | POA: Insufficient documentation

## 2013-08-06 DIAGNOSIS — R42 Dizziness and giddiness: Secondary | ICD-10-CM | POA: Diagnosis not present

## 2013-08-06 DIAGNOSIS — IMO0002 Reserved for concepts with insufficient information to code with codable children: Secondary | ICD-10-CM | POA: Insufficient documentation

## 2013-08-06 DIAGNOSIS — G4730 Sleep apnea, unspecified: Secondary | ICD-10-CM | POA: Insufficient documentation

## 2013-08-06 DIAGNOSIS — Y836 Removal of other organ (partial) (total) as the cause of abnormal reaction of the patient, or of later complication, without mention of misadventure at the time of the procedure: Secondary | ICD-10-CM | POA: Insufficient documentation

## 2013-08-06 DIAGNOSIS — Z880 Allergy status to penicillin: Secondary | ICD-10-CM | POA: Insufficient documentation

## 2013-08-06 DIAGNOSIS — R079 Chest pain, unspecified: Secondary | ICD-10-CM | POA: Diagnosis not present

## 2013-08-06 DIAGNOSIS — Z8669 Personal history of other diseases of the nervous system and sense organs: Secondary | ICD-10-CM | POA: Insufficient documentation

## 2013-08-06 DIAGNOSIS — J45901 Unspecified asthma with (acute) exacerbation: Secondary | ICD-10-CM | POA: Insufficient documentation

## 2013-08-06 DIAGNOSIS — Z9079 Acquired absence of other genital organ(s): Secondary | ICD-10-CM | POA: Diagnosis not present

## 2013-08-06 DIAGNOSIS — R1012 Left upper quadrant pain: Secondary | ICD-10-CM | POA: Insufficient documentation

## 2013-08-06 DIAGNOSIS — Z8614 Personal history of Methicillin resistant Staphylococcus aureus infection: Secondary | ICD-10-CM | POA: Diagnosis not present

## 2013-08-06 DIAGNOSIS — Z90710 Acquired absence of both cervix and uterus: Secondary | ICD-10-CM | POA: Insufficient documentation

## 2013-08-06 DIAGNOSIS — Z79899 Other long term (current) drug therapy: Secondary | ICD-10-CM | POA: Insufficient documentation

## 2013-08-06 LAB — COMPREHENSIVE METABOLIC PANEL
ALT: 25 U/L (ref 0–35)
AST: 16 U/L (ref 0–37)
Albumin: 4 g/dL (ref 3.5–5.2)
Alkaline Phosphatase: 72 U/L (ref 39–117)
BUN: 16 mg/dL (ref 6–23)
CO2: 23 mEq/L (ref 19–32)
Calcium: 9.3 mg/dL (ref 8.4–10.5)
Chloride: 103 mEq/L (ref 96–112)
Creatinine, Ser: 0.7 mg/dL (ref 0.50–1.10)
GFR calc Af Amer: >90 mL/min (ref >90)
GFR calc non Af Amer: >90 mL/min (ref >90)
Glucose, Bld: 134 mg/dL — ABNORMAL HIGH (ref 70–99)
Potassium: 3.6 mEq/L (ref 3.5–5.1)
Sodium: 138 mEq/L (ref 135–145)
Total Bilirubin: 0.1 mg/dL — ABNORMAL LOW (ref 0.3–1.2)
Total Protein: 7.6 g/dL (ref 6.0–8.3)

## 2013-08-06 LAB — URINE MICROSCOPIC-ADD ON

## 2013-08-06 LAB — CBC WITH DIFFERENTIAL/PLATELET
Basophils Absolute: 0 10*3/uL (ref 0.0–0.1)
Basophils Relative: 1 % (ref 0–1)
Eosinophils Absolute: 0.2 10*3/uL (ref 0.0–0.7)
Eosinophils Relative: 2 % (ref 0–5)
HCT: 41.3 % (ref 36.0–46.0)
Hemoglobin: 13.5 g/dL (ref 12.0–15.0)
Lymphocytes Relative: 22 % (ref 12–46)
Lymphs Abs: 1.7 10*3/uL (ref 0.7–4.0)
MCH: 29 pg (ref 26.0–34.0)
MCHC: 32.7 g/dL (ref 30.0–36.0)
MCV: 88.8 fL (ref 78.0–100.0)
Monocytes Absolute: 0.6 10*3/uL (ref 0.1–1.0)
Monocytes Relative: 8 % (ref 3–12)
Neutro Abs: 5.2 10*3/uL (ref 1.7–7.7)
Neutrophils Relative %: 67 % (ref 43–77)
Platelets: 224 10*3/uL (ref 150–400)
RBC: 4.65 MIL/uL (ref 3.87–5.11)
RDW: 13.7 % (ref 11.5–15.5)
WBC: 7.7 10*3/uL (ref 4.0–10.5)

## 2013-08-06 LAB — URINALYSIS, ROUTINE W REFLEX MICROSCOPIC
Bilirubin Urine: NEGATIVE
Glucose, UA: NEGATIVE mg/dL
Hgb urine dipstick: NEGATIVE
Ketones, ur: NEGATIVE mg/dL
Nitrite: NEGATIVE
Protein, ur: NEGATIVE mg/dL
Specific Gravity, Urine: 1.02 (ref 1.005–1.030)
Urobilinogen, UA: 0.2 mg/dL (ref 0.0–1.0)
pH: 7 (ref 5.0–8.0)

## 2013-08-06 LAB — D-DIMER, QUANTITATIVE: D-Dimer, Quant: <0.27 ug/mL-FEU (ref 0.00–0.48)

## 2013-08-06 LAB — CG4 I-STAT (LACTIC ACID): Lactic Acid, Venous: 2.21 mmol/L — ABNORMAL HIGH (ref 0.5–2.2)

## 2013-08-06 LAB — TROPONIN I: Troponin I: <0.3 ng/mL (ref <0.30)

## 2013-08-06 MED ORDER — IOHEXOL 300 MG/ML  SOLN
100.0000 mL | Freq: Once | INTRAMUSCULAR | Status: AC | PRN
  Administered 2013-08-06: 100 mL via INTRAVENOUS

## 2013-08-06 MED ORDER — SODIUM CHLORIDE 0.9 % IV BOLUS (SEPSIS)
1000.0000 mL | Freq: Once | INTRAVENOUS | Status: AC
  Administered 2013-08-06: 1000 mL via INTRAVENOUS

## 2013-08-06 MED ORDER — MORPHINE SULFATE 4 MG/ML IJ SOLN
4.0000 mg | Freq: Once | INTRAMUSCULAR | Status: DC
  Filled 2013-08-06: qty 1

## 2013-08-06 NOTE — ED Notes (Signed)
Went to get the EKG and pt was in X ray

## 2013-08-06 NOTE — ED Notes (Addendum)
Patient had totally hysterectomy on 12/16 at [facility] and is now having pain on her upper abdominal area.  Pain started today.  Patient is also c/o shortness of breath.  Patient also states that she has been experiencing constipation after surgery.

## 2013-08-06 NOTE — ED Notes (Signed)
MD at bedside. 

## 2013-08-06 NOTE — ED Notes (Signed)
Per tech, pt refusing to drink po contrast, reporting unable to tolerate.  OK per md to scan pt with iv contrast only.

## 2013-08-06 NOTE — ED Notes (Signed)
Patient transported to CT via stretcher per tech. 
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
## 2013-08-11 DIAGNOSIS — R110 Nausea: Secondary | ICD-10-CM | POA: Diagnosis not present

## 2013-08-11 DIAGNOSIS — R109 Unspecified abdominal pain: Secondary | ICD-10-CM | POA: Diagnosis not present

## 2013-08-13 ENCOUNTER — Ambulatory Visit: Payer: Medicare Other | Attending: Gynecologic Oncology | Admitting: Gynecologic Oncology

## 2013-08-13 ENCOUNTER — Encounter: Payer: Self-pay | Admitting: Gynecologic Oncology

## 2013-08-13 VITALS — BP 139/91 | HR 102 | Temp 98.1°F | Resp 16 | Ht 62.0 in | Wt 179.5 lb

## 2013-08-13 DIAGNOSIS — Z87891 Personal history of nicotine dependence: Secondary | ICD-10-CM | POA: Diagnosis not present

## 2013-08-13 DIAGNOSIS — R220 Localized swelling, mass and lump, head: Secondary | ICD-10-CM | POA: Diagnosis not present

## 2013-08-13 DIAGNOSIS — I10 Essential (primary) hypertension: Secondary | ICD-10-CM | POA: Insufficient documentation

## 2013-08-13 DIAGNOSIS — Z8544 Personal history of malignant neoplasm of other female genital organs: Secondary | ICD-10-CM | POA: Insufficient documentation

## 2013-08-13 DIAGNOSIS — N946 Dysmenorrhea, unspecified: Secondary | ICD-10-CM | POA: Diagnosis not present

## 2013-08-13 DIAGNOSIS — Z79899 Other long term (current) drug therapy: Secondary | ICD-10-CM | POA: Insufficient documentation

## 2013-08-13 DIAGNOSIS — R141 Gas pain: Secondary | ICD-10-CM | POA: Insufficient documentation

## 2013-08-13 DIAGNOSIS — E785 Hyperlipidemia, unspecified: Secondary | ICD-10-CM | POA: Insufficient documentation

## 2013-08-13 DIAGNOSIS — C549 Malignant neoplasm of corpus uteri, unspecified: Secondary | ICD-10-CM | POA: Insufficient documentation

## 2013-08-13 DIAGNOSIS — K219 Gastro-esophageal reflux disease without esophagitis: Secondary | ICD-10-CM | POA: Diagnosis not present

## 2013-08-13 DIAGNOSIS — Z90710 Acquired absence of both cervix and uterus: Secondary | ICD-10-CM | POA: Diagnosis not present

## 2013-08-13 DIAGNOSIS — M545 Low back pain, unspecified: Secondary | ICD-10-CM | POA: Diagnosis not present

## 2013-08-13 DIAGNOSIS — G4730 Sleep apnea, unspecified: Secondary | ICD-10-CM | POA: Insufficient documentation

## 2013-08-13 DIAGNOSIS — R142 Eructation: Secondary | ICD-10-CM | POA: Insufficient documentation

## 2013-08-13 DIAGNOSIS — Z9079 Acquired absence of other genital organ(s): Secondary | ICD-10-CM | POA: Insufficient documentation

## 2013-08-13 DIAGNOSIS — C541 Malignant neoplasm of endometrium: Secondary | ICD-10-CM

## 2013-08-13 NOTE — Patient Instructions (Signed)
Please keep post-op check appointment.
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
## 2013-08-16 ENCOUNTER — Encounter (HOSPITAL_BASED_OUTPATIENT_CLINIC_OR_DEPARTMENT_OTHER): Payer: Self-pay | Admitting: Emergency Medicine

## 2013-08-16 ENCOUNTER — Emergency Department (HOSPITAL_BASED_OUTPATIENT_CLINIC_OR_DEPARTMENT_OTHER)
Admission: EM | Admit: 2013-08-16 | Discharge: 2013-08-16 | Disposition: A | Discharge status: Home / Self Care | Payer: Medicare Other | Attending: Emergency Medicine | Admitting: Emergency Medicine

## 2013-08-16 DIAGNOSIS — IMO0002 Reserved for concepts with insufficient information to code with codable children: Secondary | ICD-10-CM | POA: Diagnosis not present

## 2013-08-16 DIAGNOSIS — J45909 Unspecified asthma, uncomplicated: Secondary | ICD-10-CM | POA: Insufficient documentation

## 2013-08-16 DIAGNOSIS — I10 Essential (primary) hypertension: Secondary | ICD-10-CM | POA: Diagnosis not present

## 2013-08-16 DIAGNOSIS — R141 Gas pain: Secondary | ICD-10-CM | POA: Insufficient documentation

## 2013-08-16 DIAGNOSIS — Z95818 Presence of other cardiac implants and grafts: Secondary | ICD-10-CM | POA: Insufficient documentation

## 2013-08-16 DIAGNOSIS — K219 Gastro-esophageal reflux disease without esophagitis: Secondary | ICD-10-CM | POA: Insufficient documentation

## 2013-08-16 DIAGNOSIS — N898 Other specified noninflammatory disorders of vagina: Secondary | ICD-10-CM | POA: Diagnosis not present

## 2013-08-16 DIAGNOSIS — G4730 Sleep apnea, unspecified: Secondary | ICD-10-CM | POA: Diagnosis not present

## 2013-08-16 DIAGNOSIS — Z79899 Other long term (current) drug therapy: Secondary | ICD-10-CM | POA: Diagnosis not present

## 2013-08-16 DIAGNOSIS — R109 Unspecified abdominal pain: Secondary | ICD-10-CM | POA: Diagnosis not present

## 2013-08-16 DIAGNOSIS — Z87891 Personal history of nicotine dependence: Secondary | ICD-10-CM | POA: Insufficient documentation

## 2013-08-16 DIAGNOSIS — R143 Flatulence: Principal | ICD-10-CM

## 2013-08-16 DIAGNOSIS — R142 Eructation: Principal | ICD-10-CM | POA: Insufficient documentation

## 2013-08-16 DIAGNOSIS — Z8669 Personal history of other diseases of the nervous system and sense organs: Secondary | ICD-10-CM | POA: Insufficient documentation

## 2013-08-16 DIAGNOSIS — G8918 Other acute postprocedural pain: Secondary | ICD-10-CM | POA: Diagnosis not present

## 2013-08-16 DIAGNOSIS — Z8614 Personal history of Methicillin resistant Staphylococcus aureus infection: Secondary | ICD-10-CM | POA: Insufficient documentation

## 2013-08-16 DIAGNOSIS — R140 Abdominal distension (gaseous): Secondary | ICD-10-CM

## 2013-08-16 DIAGNOSIS — Z8541 Personal history of malignant neoplasm of cervix uteri: Secondary | ICD-10-CM | POA: Diagnosis not present

## 2013-08-16 DIAGNOSIS — Z8659 Personal history of other mental and behavioral disorders: Secondary | ICD-10-CM | POA: Diagnosis not present

## 2013-08-16 DIAGNOSIS — Z880 Allergy status to penicillin: Secondary | ICD-10-CM | POA: Diagnosis not present

## 2013-08-16 DIAGNOSIS — Z8639 Personal history of other endocrine, nutritional and metabolic disease: Secondary | ICD-10-CM | POA: Diagnosis not present

## 2013-08-16 DIAGNOSIS — Z90710 Acquired absence of both cervix and uterus: Secondary | ICD-10-CM | POA: Insufficient documentation

## 2013-08-16 DIAGNOSIS — Z862 Personal history of diseases of the blood and blood-forming organs and certain disorders involving the immune mechanism: Secondary | ICD-10-CM | POA: Diagnosis not present

## 2013-08-16 LAB — URINALYSIS, ROUTINE W REFLEX MICROSCOPIC
Bilirubin Urine: NEGATIVE
Glucose, UA: NEGATIVE mg/dL
Hgb urine dipstick: NEGATIVE
Ketones, ur: NEGATIVE mg/dL
Leukocytes, UA: NEGATIVE
Nitrite: NEGATIVE
Protein, ur: NEGATIVE mg/dL
Specific Gravity, Urine: 1.027 (ref 1.005–1.030)
Urobilinogen, UA: 0.2 mg/dL (ref 0.0–1.0)
pH: 6 (ref 5.0–8.0)

## 2013-08-16 MED ORDER — ACETAMINOPHEN 500 MG PO TABS
1000.0000 mg | ORAL_TABLET | Freq: Once | ORAL | Status: AC
  Administered 2013-08-16: 1000 mg via ORAL
  Filled 2013-08-16: qty 2
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
## 2013-08-16 NOTE — Discharge Instructions (Signed)
Take tylenol for pain. See your surgeon for follow up in two days. Return for focal pain, rash, vaginal discharge, bleeding, persistent vomiting, fevers or new symptoms.  If you were given medicines take as directed.  If you are on coumadin or contraceptives realize their levels and effectiveness is altered by many different medicines.  If you have any reaction (rash, tongues swelling, other) to the medicines stop taking and see a physician.   Please follow up as directed and return to the ER or see a physician for new or worsening symptoms.  Thank you.

## 2013-08-16 NOTE — ED Notes (Signed)
C/o abd bloating since yesterday, denies vomiting or fever.

## 2013-08-19 ENCOUNTER — Encounter: Payer: Self-pay | Admitting: Family Medicine

## 2013-08-19 ENCOUNTER — Ambulatory Visit (INDEPENDENT_AMBULATORY_CARE_PROVIDER_SITE_OTHER): Payer: Medicare Other | Admitting: Family Medicine

## 2013-08-19 ENCOUNTER — Other Ambulatory Visit: Payer: Self-pay | Admitting: *Deleted

## 2013-08-19 VITALS — BP 148/80 | HR 113 | Temp 98.3°F | Wt 181.0 lb

## 2013-08-19 DIAGNOSIS — R42 Dizziness and giddiness: Secondary | ICD-10-CM

## 2013-08-19 DIAGNOSIS — Z882 Allergy status to sulfonamides status: Secondary | ICD-10-CM | POA: Diagnosis not present

## 2013-08-19 DIAGNOSIS — R1031 Right lower quadrant pain: Secondary | ICD-10-CM

## 2013-08-19 DIAGNOSIS — Z888 Allergy status to other drugs, medicaments and biological substances status: Secondary | ICD-10-CM | POA: Diagnosis not present

## 2013-08-19 DIAGNOSIS — R319 Hematuria, unspecified: Secondary | ICD-10-CM | POA: Diagnosis not present

## 2013-08-19 DIAGNOSIS — D509 Iron deficiency anemia, unspecified: Secondary | ICD-10-CM

## 2013-08-19 DIAGNOSIS — I10 Essential (primary) hypertension: Secondary | ICD-10-CM | POA: Diagnosis not present

## 2013-08-19 DIAGNOSIS — J45909 Unspecified asthma, uncomplicated: Secondary | ICD-10-CM | POA: Diagnosis not present

## 2013-08-19 DIAGNOSIS — N898 Other specified noninflammatory disorders of vagina: Secondary | ICD-10-CM | POA: Diagnosis not present

## 2013-08-19 DIAGNOSIS — Z8542 Personal history of malignant neoplasm of other parts of uterus: Secondary | ICD-10-CM | POA: Diagnosis not present

## 2013-08-19 DIAGNOSIS — K219 Gastro-esophageal reflux disease without esophagitis: Secondary | ICD-10-CM | POA: Diagnosis not present

## 2013-08-19 DIAGNOSIS — Z79899 Other long term (current) drug therapy: Secondary | ICD-10-CM | POA: Diagnosis not present

## 2013-08-19 DIAGNOSIS — Z91010 Allergy to peanuts: Secondary | ICD-10-CM | POA: Diagnosis not present

## 2013-08-19 DIAGNOSIS — D649 Anemia, unspecified: Secondary | ICD-10-CM | POA: Diagnosis not present

## 2013-08-19 DIAGNOSIS — Z885 Allergy status to narcotic agent status: Secondary | ICD-10-CM | POA: Diagnosis not present

## 2013-08-19 DIAGNOSIS — R110 Nausea: Secondary | ICD-10-CM | POA: Diagnosis not present

## 2013-08-19 DIAGNOSIS — R5383 Other fatigue: Secondary | ICD-10-CM

## 2013-08-19 DIAGNOSIS — E269 Hyperaldosteronism, unspecified: Secondary | ICD-10-CM | POA: Diagnosis not present

## 2013-08-19 DIAGNOSIS — Z883 Allergy status to other anti-infective agents status: Secondary | ICD-10-CM | POA: Diagnosis not present

## 2013-08-19 DIAGNOSIS — R5381 Other malaise: Secondary | ICD-10-CM | POA: Diagnosis not present

## 2013-08-19 DIAGNOSIS — N760 Acute vaginitis: Secondary | ICD-10-CM

## 2013-08-19 DIAGNOSIS — F411 Generalized anxiety disorder: Secondary | ICD-10-CM | POA: Diagnosis not present

## 2013-08-19 DIAGNOSIS — E785 Hyperlipidemia, unspecified: Secondary | ICD-10-CM | POA: Diagnosis not present

## 2013-08-19 DIAGNOSIS — IMO0002 Reserved for concepts with insufficient information to code with codable children: Secondary | ICD-10-CM | POA: Diagnosis not present

## 2013-08-19 DIAGNOSIS — R109 Unspecified abdominal pain: Secondary | ICD-10-CM | POA: Diagnosis not present

## 2013-08-19 DIAGNOSIS — Z8541 Personal history of malignant neoplasm of cervix uteri: Secondary | ICD-10-CM | POA: Diagnosis not present

## 2013-08-19 DIAGNOSIS — Z87891 Personal history of nicotine dependence: Secondary | ICD-10-CM | POA: Diagnosis not present

## 2013-08-19 DIAGNOSIS — Z886 Allergy status to analgesic agent status: Secondary | ICD-10-CM | POA: Diagnosis not present

## 2013-08-19 DIAGNOSIS — Z880 Allergy status to penicillin: Secondary | ICD-10-CM | POA: Diagnosis not present

## 2013-08-19 LAB — POCT URINALYSIS DIPSTICK
Bilirubin, UA: NEGATIVE
Glucose, UA: NEGATIVE
Ketones, UA: NEGATIVE
Nitrite, UA: NEGATIVE
Protein, UA: NEGATIVE
Spec Grav, UA: 1.02
Urobilinogen, UA: 0.2
pH, UA: 6

## 2013-08-19 LAB — POCT HEMOGLOBIN: Hemoglobin: 12.3 g/dL (ref 12.2–16.2)

## 2013-08-19 MED ORDER — CEPHALEXIN 250 MG/5ML PO SUSR: 250.0000 mg | Freq: Three times a day (TID) | ORAL | Status: DC

## 2013-08-19 NOTE — Progress Notes (Signed)
   Subjective:    Patient ID: Jennifer [name], female    DOB: [birth year], 48 y.o.   MRN: [id number]  HPI Has been more fatigued and tired lately. She wonders if could have low iron after her surgery.  She had a  Complete hysterectomy a couple weeks ago for endometrial cancer. She's not been sleeping well since then. He just feels exhausted.  She has been feeling nauseated and dizzy and lightheaded . Feels worse today.  No fever, chills.  Checked her sugar this AM and it was normal.  Has been much more emotional and has constantly felt cold. Says bowels have been moving normally and has been using a stool softener. Last week she did notice some clear brown tinged discharge vaginally. She did emergency department. They offer to do an exam but she declined as her GYN and had told her not have anyone evaluate that area since she does have surgery until she saw her back. She does have an appointment in February with the gynecologist oncologist.  Also feels a little bit more swollen than the right lower quadrant. Her bowels have been moving normally. She feels like it, but more sore as well. Wounds are healing well. Says has been trying to stay hydrated.    Review of Systems     Objective:   Physical Exam  Constitutional: She is oriented to person, place, and time. She appears well-developed and well-nourished.  HENT:  Head: Normocephalic and atraumatic.  Eyes: Conjunctivae and EOM are normal.  Cardiovascular: Normal rate, regular rhythm and normal heart sounds.   Pulmonary/Chest: Effort normal and breath sounds normal.  Abdominal: Soft. Bowel sounds are normal. She exhibits no distension and no mass. There is no tenderness. There is no rebound and no guarding.  Slight distention in the RLQ and mildly tender.   Neurological: She is alert and oriented to person, place, and time.  Skin: Skin is warm and dry. No pallor.  Incisions are clean, dry, intact.  Psychiatric: She has a normal mood and  affect. Her behavior is normal.          Assessment & Plan:  Fatigue-hemoglobin is 12.3 today which is reassuring.  Will check iron stores.  Will check UA to eval for post cath infection. Will check CBC w/ diff for infection.     Lightheaded - orthostatics peformed. No sign of hypovolemia. Her membranes were moist and she is not orthostatic.  UTI-possible urinary tract infection. She did have blood in her urinalysis today. We'll send for culture for confirmation since she was catheterized. That in addition to really not feeling well overall today I'm going to go ahead and treat her. She think she is tolerated Keflex okay in the past. She has multiple drug allergies which makes it difficult to choose something. She would prefer liquid. And we will treat with low-dose. Keflex 250 mg 3 times a day for 10 days.   Right lower quadrant pain and swelling-unclear etiology. Certainly could be postsurgical changes. She's worried that she may have damaged something or torn a suture et Ronney Asters. I explained to her that it is difficult to tell this point in time. He then with a broken suture things can sometimes still heal normally. And she reports that her bowels are moving regularly with a stool softener she's been taking. Incisions are healing well which is reassuring.

## 2013-08-20 ENCOUNTER — Telehealth: Payer: Self-pay | Admitting: Gynecologic Oncology

## 2013-08-20 LAB — FERRITIN: Ferritin: 45 ng/mL (ref 10–291)

## 2013-08-20 LAB — IRON AND TIBC
%SAT: 11 % — ABNORMAL LOW (ref 20–55)
Iron: 33 ug/dL — ABNORMAL LOW (ref 42–145)
TIBC: 311 ug/dL (ref 250–470)
UIBC: 278 ug/dL (ref 125–400)

## 2013-08-20 LAB — WET PREP, GENITAL
Clue Cells Wet Prep HPF POC: NONE SEEN
Trich, Wet Prep: NONE SEEN
WBC, Wet Prep HPF POC: NONE SEEN
Yeast Wet Prep HPF POC: NONE SEEN

## 2013-08-20 LAB — RETICULOCYTES
ABS Retic: 57.6 10*3/uL (ref 19.0–186.0)
RBC.: 4.43 MIL/uL (ref 3.87–5.11)
Retic Ct Pct: 1.3 % (ref 0.4–2.3)

## 2013-08-20 LAB — CBC WITH DIFFERENTIAL/PLATELET
Basophils Absolute: 0 10*3/uL (ref 0.0–0.1)
Basophils Relative: 1 % (ref 0–1)
Eosinophils Absolute: 0.2 10*3/uL (ref 0.0–0.7)
Eosinophils Relative: 3 % (ref 0–5)
HCT: 38.6 % (ref 36.0–46.0)
Hemoglobin: 12.9 g/dL (ref 12.0–15.0)
Lymphocytes Relative: 20 % (ref 12–46)
Lymphs Abs: 1.1 10*3/uL (ref 0.7–4.0)
MCH: 29.1 pg (ref 26.0–34.0)
MCHC: 33.4 g/dL (ref 30.0–36.0)
MCV: 87.1 fL (ref 78.0–100.0)
Monocytes Absolute: 0.5 10*3/uL (ref 0.1–1.0)
Monocytes Relative: 10 % (ref 3–12)
Neutro Abs: 3.7 10*3/uL (ref 1.7–7.7)
Neutrophils Relative %: 66 % (ref 43–77)
Platelets: 254 10*3/uL (ref 150–400)
RBC: 4.43 MIL/uL (ref 3.87–5.11)
RDW: 14.5 % (ref 11.5–15.5)
WBC: 5.5 10*3/uL (ref 4.0–10.5)

## 2013-08-20 NOTE — Telephone Encounter (Signed)
Patient called asking about whether she would be cleared from a surgical standpoint from Dr. Alycia Rossetti to have a MRI of the brain per another provider to follow a "growth in her brain."  Informed that she would be contacted with Dr. Elenora Gamma recommendations after she completed her surgical cases.  08/19/13 at 4:00pm:  Pt called back to the office and was informed that she would be ok to proceed with her MRI per Dr. Alycia Rossetti.

## 2013-08-20 NOTE — Telephone Encounter (Signed)
Patient called, very anxious, reporting mild right abdominal swelling around her abdominal incision and minimal amount of blood in a urine sample given at her primary care provider's office.  Reassured that the mild abdominal swelling can be normal after major surgery and can take several weeks to subside.  Denies nausea, vomiting, fever, chills.  Passing flatus and having bowel movements.  Intermittent hot flashes reported with pt declining pharmacologic intervention.  Reassured that a minimal amount of vaginal bleeding is normal after having a hysterectomy and the trace amount of blood in her urine was most likely coming from her vaginal.  Denies other urinary symptoms.  She asked about whether she should take Keflex, which was prescribed for her for a possible UTI.  Culture results are pending.  She is advised that she could start taking the medication or wait until the culture results return.  Stating that she would probably wait.  Reportable signs and symptoms reviewed.  Instructed to call for any questions or concerns and reassured that all reported symptoms on this call were normal findings post-operatively.

## 2013-08-21 ENCOUNTER — Telehealth: Payer: Self-pay | Admitting: Family Medicine

## 2013-08-21 DIAGNOSIS — F339 Major depressive disorder, recurrent, unspecified: Secondary | ICD-10-CM | POA: Diagnosis not present

## 2013-08-21 LAB — URINE CULTURE
Colony Count: NO GROWTH
Organism ID, Bacteria: NO GROWTH

## 2013-08-21 NOTE — Telephone Encounter (Signed)
Pt called and she wants to know since she got results does she still need to keep appt for 1/9 also was her labs faxed to her haematologist.

## 2013-08-21 NOTE — ED Notes (Signed)
tcf mr. Heiner requesting to know why her 12/24 ct that is now accesible through my chart is showing that the radiologist dictated she as a normal appendicitis when she had an appendectomy > 20 yrs ago. Mrs. Woodstock is worried that something else could be causing an image to appear on her ct that makes her look as though she has an appendix even thought she does not. I referred mrs. Fredenburg to radiology department. All prior ct scans of abdomen that were transcribed by radiologist clearly show surgical markings indictive of appendectomy. Reassured mrs. Bellmore that if she spoke with radiology department they could handle this issue and that more than likely this clerical error in transcription if that is what this is, is not cause of her abdominal pain. On she has been seen at other facilities since 12/24 and ct scans continue to appear normal and pcp along with serveral specialist are continuing to follow her

## 2013-08-21 NOTE — Telephone Encounter (Signed)
Spoke with pt and appt made for next Thursday to f/u.Jennifer [name] [place]

## 2013-08-22 ENCOUNTER — Ambulatory Visit: Payer: Medicare Other | Admitting: Family Medicine

## 2013-08-22 DIAGNOSIS — J029 Acute pharyngitis, unspecified: Secondary | ICD-10-CM | POA: Diagnosis not present

## 2013-08-22 DIAGNOSIS — J040 Acute laryngitis: Secondary | ICD-10-CM | POA: Diagnosis not present

## 2013-08-22 DIAGNOSIS — J3489 Other specified disorders of nose and nasal sinuses: Secondary | ICD-10-CM | POA: Diagnosis not present

## 2013-08-22 DIAGNOSIS — R0789 Other chest pain: Secondary | ICD-10-CM | POA: Diagnosis not present

## 2013-08-22 DIAGNOSIS — R05 Cough: Secondary | ICD-10-CM | POA: Diagnosis not present

## 2013-08-22 DIAGNOSIS — R059 Cough, unspecified: Secondary | ICD-10-CM | POA: Diagnosis not present

## 2013-08-22 DIAGNOSIS — S31109A Unspecified open wound of abdominal wall, unspecified quadrant without penetration into peritoneal cavity, initial encounter: Secondary | ICD-10-CM | POA: Diagnosis not present

## 2013-08-22 DIAGNOSIS — Y838 Other surgical procedures as the cause of abnormal reaction of the patient, or of later complication, without mention of misadventure at the time of the procedure: Secondary | ICD-10-CM | POA: Diagnosis not present

## 2013-08-22 DIAGNOSIS — J069 Acute upper respiratory infection, unspecified: Secondary | ICD-10-CM | POA: Diagnosis not present

## 2013-08-22 DIAGNOSIS — R490 Dysphonia: Secondary | ICD-10-CM | POA: Diagnosis not present

## 2013-08-24 ENCOUNTER — Emergency Department (HOSPITAL_BASED_OUTPATIENT_CLINIC_OR_DEPARTMENT_OTHER): Payer: Medicare Other

## 2013-08-24 ENCOUNTER — Encounter (HOSPITAL_BASED_OUTPATIENT_CLINIC_OR_DEPARTMENT_OTHER): Payer: Self-pay | Admitting: Emergency Medicine

## 2013-08-24 ENCOUNTER — Emergency Department (HOSPITAL_BASED_OUTPATIENT_CLINIC_OR_DEPARTMENT_OTHER)
Admission: EM | Admit: 2013-08-24 | Discharge: 2013-08-24 | Disposition: A | Discharge status: Home / Self Care | Payer: Medicare Other | Attending: Emergency Medicine | Admitting: Emergency Medicine

## 2013-08-24 DIAGNOSIS — I10 Essential (primary) hypertension: Secondary | ICD-10-CM | POA: Insufficient documentation

## 2013-08-24 DIAGNOSIS — Z95818 Presence of other cardiac implants and grafts: Secondary | ICD-10-CM | POA: Diagnosis not present

## 2013-08-24 DIAGNOSIS — M79609 Pain in unspecified limb: Secondary | ICD-10-CM | POA: Insufficient documentation

## 2013-08-24 DIAGNOSIS — G4730 Sleep apnea, unspecified: Secondary | ICD-10-CM | POA: Diagnosis not present

## 2013-08-24 DIAGNOSIS — R0602 Shortness of breath: Secondary | ICD-10-CM | POA: Diagnosis not present

## 2013-08-24 DIAGNOSIS — Z87891 Personal history of nicotine dependence: Secondary | ICD-10-CM | POA: Insufficient documentation

## 2013-08-24 DIAGNOSIS — R42 Dizziness and giddiness: Secondary | ICD-10-CM | POA: Diagnosis not present

## 2013-08-24 DIAGNOSIS — Z8719 Personal history of other diseases of the digestive system: Secondary | ICD-10-CM | POA: Insufficient documentation

## 2013-08-24 DIAGNOSIS — Z8639 Personal history of other endocrine, nutritional and metabolic disease: Secondary | ICD-10-CM | POA: Insufficient documentation

## 2013-08-24 DIAGNOSIS — Z8659 Personal history of other mental and behavioral disorders: Secondary | ICD-10-CM | POA: Insufficient documentation

## 2013-08-24 DIAGNOSIS — Z862 Personal history of diseases of the blood and blood-forming organs and certain disorders involving the immune mechanism: Secondary | ICD-10-CM | POA: Insufficient documentation

## 2013-08-24 DIAGNOSIS — Z79899 Other long term (current) drug therapy: Secondary | ICD-10-CM | POA: Diagnosis not present

## 2013-08-24 DIAGNOSIS — J45901 Unspecified asthma with (acute) exacerbation: Secondary | ICD-10-CM | POA: Diagnosis not present

## 2013-08-24 DIAGNOSIS — Z8614 Personal history of Methicillin resistant Staphylococcus aureus infection: Secondary | ICD-10-CM | POA: Diagnosis not present

## 2013-08-24 DIAGNOSIS — R079 Chest pain, unspecified: Secondary | ICD-10-CM | POA: Diagnosis not present

## 2013-08-24 DIAGNOSIS — Z8541 Personal history of malignant neoplasm of cervix uteri: Secondary | ICD-10-CM | POA: Insufficient documentation

## 2013-08-24 DIAGNOSIS — R918 Other nonspecific abnormal finding of lung field: Secondary | ICD-10-CM | POA: Diagnosis not present

## 2013-08-24 DIAGNOSIS — R110 Nausea: Secondary | ICD-10-CM | POA: Insufficient documentation

## 2013-08-24 DIAGNOSIS — Z880 Allergy status to penicillin: Secondary | ICD-10-CM | POA: Diagnosis not present

## 2013-08-24 LAB — COMPREHENSIVE METABOLIC PANEL
ALT: 29 U/L (ref 0–35)
AST: 19 U/L (ref 0–37)
Albumin: 3.9 g/dL (ref 3.5–5.2)
Alkaline Phosphatase: 73 U/L (ref 39–117)
BUN: 12 mg/dL (ref 6–23)
CO2: 25 mEq/L (ref 19–32)
Calcium: 9.1 mg/dL (ref 8.4–10.5)
Chloride: 103 mEq/L (ref 96–112)
Creatinine, Ser: 0.6 mg/dL (ref 0.50–1.10)
GFR calc Af Amer: >90 mL/min (ref >90)
GFR calc non Af Amer: >90 mL/min (ref >90)
Glucose, Bld: 101 mg/dL — ABNORMAL HIGH (ref 70–99)
Potassium: 3.6 mEq/L — ABNORMAL LOW (ref 3.7–5.3)
Sodium: 143 mEq/L (ref 137–147)
Total Bilirubin: 0.2 mg/dL — ABNORMAL LOW (ref 0.3–1.2)
Total Protein: 7.6 g/dL (ref 6.0–8.3)

## 2013-08-24 LAB — URINALYSIS, ROUTINE W REFLEX MICROSCOPIC
Bilirubin Urine: NEGATIVE
Glucose, UA: NEGATIVE mg/dL
Hgb urine dipstick: NEGATIVE
Ketones, ur: NEGATIVE mg/dL
Leukocytes, UA: NEGATIVE
Nitrite: NEGATIVE
Protein, ur: NEGATIVE mg/dL
Specific Gravity, Urine: 1.028 (ref 1.005–1.030)
Urobilinogen, UA: 0.2 mg/dL (ref 0.0–1.0)
pH: 7.5 (ref 5.0–8.0)

## 2013-08-24 LAB — CBC WITH DIFFERENTIAL/PLATELET
Basophils Absolute: 0 10*3/uL (ref 0.0–0.1)
Basophils Relative: 0 % (ref 0–1)
Eosinophils Absolute: 0.2 10*3/uL (ref 0.0–0.7)
Eosinophils Relative: 3 % (ref 0–5)
HCT: 38.8 % (ref 36.0–46.0)
Hemoglobin: 12.5 g/dL (ref 12.0–15.0)
Lymphocytes Relative: 22 % (ref 12–46)
Lymphs Abs: 1.5 10*3/uL (ref 0.7–4.0)
MCH: 28.3 pg (ref 26.0–34.0)
MCHC: 32.2 g/dL (ref 30.0–36.0)
MCV: 88 fL (ref 78.0–100.0)
Monocytes Absolute: 0.5 10*3/uL (ref 0.1–1.0)
Monocytes Relative: 8 % (ref 3–12)
Neutro Abs: 4.6 10*3/uL (ref 1.7–7.7)
Neutrophils Relative %: 67 % (ref 43–77)
Platelets: 219 10*3/uL (ref 150–400)
RBC: 4.41 MIL/uL (ref 3.87–5.11)
RDW: 13.5 % (ref 11.5–15.5)
WBC: 6.9 10*3/uL (ref 4.0–10.5)

## 2013-08-24 LAB — PROTIME-INR
INR: 0.93 (ref 0.00–1.49)
Prothrombin Time: 12.3 seconds (ref 11.6–15.2)

## 2013-08-24 LAB — TROPONIN I: Troponin I: <0.3 ng/mL (ref <0.30)

## 2013-08-24 MED ORDER — IOHEXOL 350 MG/ML SOLN
100.0000 mL | Freq: Once | INTRAVENOUS | Status: AC | PRN
  Administered 2013-08-24: 100 mL via INTRAVENOUS

## 2013-08-24 MED ORDER — SODIUM CHLORIDE 0.9 % IV BOLUS (SEPSIS)
2000.0000 mL | Freq: Once | INTRAVENOUS | Status: AC
  Administered 2013-08-24: 1000 mL via INTRAVENOUS

## 2013-08-24 MED ORDER — ENOXAPARIN SODIUM 150 MG/ML ~~LOC~~ SOLN
1.5000 mg/kg | Freq: Once | SUBCUTANEOUS | Status: AC
  Administered 2013-08-24: 125 mg via SUBCUTANEOUS
  Filled 2013-08-24: qty 1

## 2013-08-24 NOTE — ED Notes (Signed)
Attempted PIV to left and right AC.  Unable to get IV to thread into vein after return of blood.

## 2013-08-24 NOTE — ED Notes (Addendum)
Pt is ambulatory with a steady gait on discharge, in agreement with discharge plan of care, aware of f/u appointment tomorrow morning at 0800 for ultrasound of her right leg, and was willing to take the Lovenox injection prior to leaving.  Second liter of NS was not infused as patient requested.

## 2013-08-24 NOTE — Discharge Instructions (Signed)
The Scan of your chest tonight shows no blood clot in your lung. We will need you to return tomorrow for ultrasound of your right leg to be sure there is no blood clot. We are giving you an injection of a blood thinner tonight in case there is a clot.

## 2013-08-24 NOTE — ED Notes (Addendum)
One liter of normal saline has infused.  Patient states she is not comfortable with receiving a second liter of saline because of the effect it might have on her primary aldosteronism.  Will notify Delano Metz, FNP.

## 2013-08-24 NOTE — ED Notes (Signed)
Resting quietly in bed.  No needs are voiced.

## 2013-08-24 NOTE — ED Provider Notes (Signed)
Medical screening examination/treatment/procedure(s) were conducted as a shared visit with non-physician practitioner(s) and myself.  I personally evaluated the patient during the encounter.  EKG Interpretation   None      3 weeks postop for hysterectomy for endometrial cancer no history of DVT but has a few days of sharp stabbing pleuritic chest pain with shortness of breath lungs clear pulse oximetry normal but patient tachycardic also has a few days right thigh pain and mild tenderness medial thigh with no erythema or induration to her skin no tenderness to her right calf and her lower legs are symmetric right foot dorsalis pedis pulse intact normal light touch to her right leg good movement of the right foot no low back pain radiating down the leg at all she also has some vague diffuse persistent postoperative abdominal pain with minimal diffuse abdominal tenderness. High risk Wells PE so CTA ordered also high risk Wells DVT. CTA neg PE will treat one dose Lovenox and order [place] r/o DVT for tomorrow.  Babette Relic, MD 08/25/13 2200

## 2013-08-24 NOTE — ED Notes (Signed)
Pt having right leg pain from hip to foot for two days.  Pt denies any recent long travel.  Pt states she had surgery in December.  No respiratory distress.
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
## 2013-08-25 ENCOUNTER — Ambulatory Visit (HOSPITAL_BASED_OUTPATIENT_CLINIC_OR_DEPARTMENT_OTHER)
Admit: 2013-08-25 | Discharge: 2013-08-25 | Disposition: A | Discharge status: Home / Self Care | Payer: Medicare Other | Attending: Emergency Medicine | Admitting: Emergency Medicine

## 2013-08-25 DIAGNOSIS — M79609 Pain in unspecified limb: Secondary | ICD-10-CM | POA: Insufficient documentation

## 2013-08-25 DIAGNOSIS — M25559 Pain in unspecified hip: Secondary | ICD-10-CM | POA: Insufficient documentation

## 2013-08-25 DIAGNOSIS — Z8541 Personal history of malignant neoplasm of cervix uteri: Secondary | ICD-10-CM | POA: Insufficient documentation

## 2013-08-25 NOTE — ED Provider Notes (Signed)
Medical screening examination/treatment/procedure(s) were performed by non-physician practitioner and as supervising physician I was immediately available for consultation/collaboration.  EKG Interpretation    Date/Time:  Sunday August 24 2013 16:40:24 EST Ventricular Rate:  92 PR Interval:  104 QRS Duration: 88 QT Interval:  354 QTC Calculation: 437 R Axis:   27 Text Interpretation:  Sinus rhythm with short PR Otherwise normal ECG No significant change since last tracing Confirmed by [facility]  MD, JOHN ([id number]) on 08/24/2013 8:37:58 PM              Neta Ehlers, MD 08/25/13 [phone]

## 2013-08-26 ENCOUNTER — Telehealth: Payer: Self-pay

## 2013-08-26 ENCOUNTER — Other Ambulatory Visit: Payer: Self-pay | Admitting: Gynecologic Oncology

## 2013-08-26 DIAGNOSIS — R55 Syncope and collapse: Secondary | ICD-10-CM

## 2013-08-26 MED ORDER — ESTROGENS CONJUGATED 0.3 MG PO TABS: 0.3000 mg | ORAL_TABLET | Freq: Every day | ORAL | Status: DC

## 2013-08-26 NOTE — Telephone Encounter (Signed)
Benadryl can work for some people. And has a short half-life. Only last for about 4-6 hours and then wears off. She can certainly try this. If she's looking for something a little bit more natural valerian root capsules can be very helpful. They're sold at [employer].  If this is not helpful then we can try medications such as trazodone which is helpful for sleep. Please let me know if she wants to try I can send over a prescription to the pharmacy. Also make sure bedroom is dark, cool, quiet with no disruptions. And avoid watching TV, getting on the computer, right before bedtime.

## 2013-08-26 NOTE — Telephone Encounter (Signed)
Patient advised.

## 2013-08-26 NOTE — Progress Notes (Signed)
Patient called with complaints of right leg pain that has been a chronic issue.  She went to the ED in [facility] and had a negative doppler.  [phone].  She reports vasomotor symptoms including hot flashes and facial flushing.  Willing to try a low dose of hormone to assist with symptom relief.  She is to see Dr. Madilyn Fireman this Thursday.  Dr. Alycia Rossetti notified of the above via email.  Will send script to [facility] in [place].  Reportable signs and symptoms reviewed.  She is to call the office with an update.

## 2013-08-26 NOTE — Telephone Encounter (Signed)
Jennifer [name] is having trouble sleeping. Is there anything she can take?

## 2013-08-27 ENCOUNTER — Other Ambulatory Visit: Payer: Self-pay

## 2013-08-27 ENCOUNTER — Emergency Department (HOSPITAL_COMMUNITY): Payer: Medicare Other

## 2013-08-27 ENCOUNTER — Encounter (HOSPITAL_COMMUNITY): Payer: Self-pay | Admitting: Emergency Medicine

## 2013-08-27 ENCOUNTER — Emergency Department (HOSPITAL_COMMUNITY)
Admission: EM | Admit: 2013-08-27 | Discharge: 2013-08-28 | Disposition: A | Discharge status: Home / Self Care | Payer: Medicare Other | Attending: Emergency Medicine | Admitting: Emergency Medicine

## 2013-08-27 ENCOUNTER — Telehealth: Payer: Self-pay | Admitting: Gynecologic Oncology

## 2013-08-27 DIAGNOSIS — R002 Palpitations: Secondary | ICD-10-CM

## 2013-08-27 DIAGNOSIS — Z8739 Personal history of other diseases of the musculoskeletal system and connective tissue: Secondary | ICD-10-CM | POA: Insufficient documentation

## 2013-08-27 DIAGNOSIS — I10 Essential (primary) hypertension: Secondary | ICD-10-CM | POA: Diagnosis not present

## 2013-08-27 DIAGNOSIS — R110 Nausea: Secondary | ICD-10-CM | POA: Insufficient documentation

## 2013-08-27 DIAGNOSIS — R079 Chest pain, unspecified: Secondary | ICD-10-CM | POA: Diagnosis not present

## 2013-08-27 DIAGNOSIS — J45909 Unspecified asthma, uncomplicated: Secondary | ICD-10-CM | POA: Diagnosis not present

## 2013-08-27 DIAGNOSIS — R0602 Shortness of breath: Secondary | ICD-10-CM | POA: Diagnosis not present

## 2013-08-27 DIAGNOSIS — M545 Low back pain, unspecified: Secondary | ICD-10-CM | POA: Diagnosis not present

## 2013-08-27 DIAGNOSIS — IMO0002 Reserved for concepts with insufficient information to code with codable children: Secondary | ICD-10-CM | POA: Insufficient documentation

## 2013-08-27 DIAGNOSIS — Z95818 Presence of other cardiac implants and grafts: Secondary | ICD-10-CM | POA: Diagnosis not present

## 2013-08-27 DIAGNOSIS — Z9851 Tubal ligation status: Secondary | ICD-10-CM | POA: Insufficient documentation

## 2013-08-27 DIAGNOSIS — Z8542 Personal history of malignant neoplasm of other parts of uterus: Secondary | ICD-10-CM | POA: Diagnosis not present

## 2013-08-27 DIAGNOSIS — Z8639 Personal history of other endocrine, nutritional and metabolic disease: Secondary | ICD-10-CM | POA: Diagnosis not present

## 2013-08-27 DIAGNOSIS — Z880 Allergy status to penicillin: Secondary | ICD-10-CM | POA: Diagnosis not present

## 2013-08-27 DIAGNOSIS — Z8614 Personal history of Methicillin resistant Staphylococcus aureus infection: Secondary | ICD-10-CM | POA: Insufficient documentation

## 2013-08-27 DIAGNOSIS — R1310 Dysphagia, unspecified: Secondary | ICD-10-CM | POA: Diagnosis not present

## 2013-08-27 DIAGNOSIS — R059 Cough, unspecified: Secondary | ICD-10-CM | POA: Diagnosis not present

## 2013-08-27 DIAGNOSIS — R0789 Other chest pain: Secondary | ICD-10-CM | POA: Diagnosis not present

## 2013-08-27 DIAGNOSIS — Z9109 Other allergy status, other than to drugs and biological substances: Secondary | ICD-10-CM | POA: Diagnosis not present

## 2013-08-27 DIAGNOSIS — Z8719 Personal history of other diseases of the digestive system: Secondary | ICD-10-CM | POA: Insufficient documentation

## 2013-08-27 DIAGNOSIS — R1011 Right upper quadrant pain: Secondary | ICD-10-CM | POA: Diagnosis not present

## 2013-08-27 DIAGNOSIS — Z9089 Acquired absence of other organs: Secondary | ICD-10-CM | POA: Insufficient documentation

## 2013-08-27 DIAGNOSIS — Z8619 Personal history of other infectious and parasitic diseases: Secondary | ICD-10-CM | POA: Diagnosis not present

## 2013-08-27 DIAGNOSIS — Z8659 Personal history of other mental and behavioral disorders: Secondary | ICD-10-CM | POA: Diagnosis not present

## 2013-08-27 DIAGNOSIS — G4730 Sleep apnea, unspecified: Secondary | ICD-10-CM | POA: Insufficient documentation

## 2013-08-27 DIAGNOSIS — Z9079 Acquired absence of other genital organ(s): Secondary | ICD-10-CM | POA: Insufficient documentation

## 2013-08-27 DIAGNOSIS — Z9981 Dependence on supplemental oxygen: Secondary | ICD-10-CM | POA: Insufficient documentation

## 2013-08-27 DIAGNOSIS — E669 Obesity, unspecified: Secondary | ICD-10-CM | POA: Diagnosis not present

## 2013-08-27 DIAGNOSIS — M549 Dorsalgia, unspecified: Secondary | ICD-10-CM

## 2013-08-27 DIAGNOSIS — Z8679 Personal history of other diseases of the circulatory system: Secondary | ICD-10-CM | POA: Diagnosis not present

## 2013-08-27 DIAGNOSIS — D1803 Hemangioma of intra-abdominal structures: Secondary | ICD-10-CM | POA: Diagnosis not present

## 2013-08-27 DIAGNOSIS — Z8742 Personal history of other diseases of the female genital tract: Secondary | ICD-10-CM | POA: Insufficient documentation

## 2013-08-27 DIAGNOSIS — Z87891 Personal history of nicotine dependence: Secondary | ICD-10-CM | POA: Diagnosis not present

## 2013-08-27 DIAGNOSIS — R935 Abnormal findings on diagnostic imaging of other abdominal regions, including retroperitoneum: Secondary | ICD-10-CM | POA: Diagnosis not present

## 2013-08-27 DIAGNOSIS — Z79899 Other long term (current) drug therapy: Secondary | ICD-10-CM | POA: Insufficient documentation

## 2013-08-27 DIAGNOSIS — R05 Cough: Secondary | ICD-10-CM | POA: Diagnosis not present

## 2013-08-27 DIAGNOSIS — Z862 Personal history of diseases of the blood and blood-forming organs and certain disorders involving the immune mechanism: Secondary | ICD-10-CM | POA: Insufficient documentation

## 2013-08-27 DIAGNOSIS — Z90710 Acquired absence of both cervix and uterus: Secondary | ICD-10-CM | POA: Insufficient documentation

## 2013-08-27 DIAGNOSIS — IMO0001 Reserved for inherently not codable concepts without codable children: Secondary | ICD-10-CM | POA: Diagnosis not present

## 2013-08-27 LAB — URINALYSIS, ROUTINE W REFLEX MICROSCOPIC
Bilirubin Urine: NEGATIVE
Glucose, UA: NEGATIVE mg/dL
Hgb urine dipstick: NEGATIVE
Ketones, ur: NEGATIVE mg/dL
Leukocytes, UA: NEGATIVE
Nitrite: NEGATIVE
Protein, ur: NEGATIVE mg/dL
Specific Gravity, Urine: >1.046 — ABNORMAL HIGH (ref 1.005–1.030)
Urobilinogen, UA: 0.2 mg/dL (ref 0.0–1.0)
pH: 7 (ref 5.0–8.0)

## 2013-08-27 LAB — CBC WITH DIFFERENTIAL/PLATELET
Basophils Absolute: 0.1 10*3/uL (ref 0.0–0.1)
Basophils Relative: 1 % (ref 0–1)
Eosinophils Absolute: 0.1 10*3/uL (ref 0.0–0.7)
Eosinophils Relative: 2 % (ref 0–5)
HCT: 40 % (ref 36.0–46.0)
Hemoglobin: 13.3 g/dL (ref 12.0–15.0)
Lymphocytes Relative: 24 % (ref 12–46)
Lymphs Abs: 1.6 10*3/uL (ref 0.7–4.0)
MCH: 28.7 pg (ref 26.0–34.0)
MCHC: 33.3 g/dL (ref 30.0–36.0)
MCV: 86.4 fL (ref 78.0–100.0)
Monocytes Absolute: 0.6 10*3/uL (ref 0.1–1.0)
Monocytes Relative: 9 % (ref 3–12)
Neutro Abs: 4.4 10*3/uL (ref 1.7–7.7)
Neutrophils Relative %: 64 % (ref 43–77)
Platelets: 229 10*3/uL (ref 150–400)
RBC: 4.63 MIL/uL (ref 3.87–5.11)
RDW: 13.6 % (ref 11.5–15.5)
WBC: 6.8 10*3/uL (ref 4.0–10.5)

## 2013-08-27 LAB — LIPASE, BLOOD: Lipase: 47 U/L (ref 11–59)

## 2013-08-27 LAB — COMPREHENSIVE METABOLIC PANEL
ALT: 55 U/L — ABNORMAL HIGH (ref 0–35)
AST: 35 U/L (ref 0–37)
Albumin: 4.1 g/dL (ref 3.5–5.2)
Alkaline Phosphatase: 72 U/L (ref 39–117)
BUN: 11 mg/dL (ref 6–23)
CO2: 24 mEq/L (ref 19–32)
Calcium: 9.4 mg/dL (ref 8.4–10.5)
Chloride: 102 mEq/L (ref 96–112)
Creatinine, Ser: 0.64 mg/dL (ref 0.50–1.10)
GFR calc Af Amer: >90 mL/min (ref >90)
GFR calc non Af Amer: >90 mL/min (ref >90)
Glucose, Bld: 97 mg/dL (ref 70–99)
Potassium: 3.6 mEq/L — ABNORMAL LOW (ref 3.7–5.3)
Sodium: 139 mEq/L (ref 137–147)
Total Bilirubin: <0.2 mg/dL — ABNORMAL LOW (ref 0.3–1.2)
Total Protein: 7.6 g/dL (ref 6.0–8.3)

## 2013-08-27 MED ORDER — IOHEXOL 300 MG/ML  SOLN
25.0000 mL | Freq: Once | INTRAMUSCULAR | Status: AC | PRN
  Administered 2013-08-27: 25 mL via ORAL

## 2013-08-27 MED ORDER — IOHEXOL 300 MG/ML  SOLN
100.0000 mL | Freq: Once | INTRAMUSCULAR | Status: AC | PRN
  Administered 2013-08-27: 100 mL via INTRAVENOUS

## 2013-08-27 MED ORDER — ACETAMINOPHEN 325 MG PO TABS
650.0000 mg | ORAL_TABLET | Freq: Once | ORAL | Status: DC
  Filled 2013-08-27: qty 2

## 2013-08-27 MED ORDER — IBUPROFEN 800 MG PO TABS
800.0000 mg | ORAL_TABLET | Freq: Once | ORAL | Status: DC
  Filled 2013-08-27: qty 1

## 2013-08-27 MED ORDER — IBUPROFEN 100 MG/5ML PO SUSP
600.0000 mg | Freq: Once | ORAL | Status: AC
  Administered 2013-08-27: 600 mg via ORAL
  Filled 2013-08-27: qty 30

## 2013-08-27 MED ORDER — ACETAMINOPHEN 160 MG/5ML PO SOLN
650.0000 mg | Freq: Once | ORAL | Status: AC
  Administered 2013-08-27: 650 mg via ORAL
  Filled 2013-08-27: qty 20.3

## 2013-08-27 MED ORDER — SODIUM CHLORIDE 0.9 % IV BOLUS (SEPSIS)
1000.0000 mL | Freq: Once | INTRAVENOUS | Status: AC
  Administered 2013-08-27: 1000 mL via INTRAVENOUS
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
## 2013-08-27 NOTE — ED Notes (Signed)
PA at bedside discussing pain management with pt. Pt constantly denies wanting to take high pain meds or being "doped up" on medications. Pt constantly repeats she does not want narcotics. Pt explains to pt how pain medications would help her pain. Pt continues to deny wanting pain meds. Pt also denies wanting to take muscle relaxers.

## 2013-08-27 NOTE — ED Notes (Signed)
Pt ambulated to the restroom, reminded of the need for urine, pt stated she did not need to urinate at this time. Cup given

## 2013-08-27 NOTE — ED Notes (Signed)
Pt reports pain to R lower back area and R lower chest area. Pt reports ab hyster surgery on 12/16. Pt states she does have nausea.

## 2013-08-27 NOTE — ED Notes (Signed)
Patient transported to CT 

## 2013-08-27 NOTE — ED Notes (Signed)
Ultrasound at bedside

## 2013-08-27 NOTE — Telephone Encounter (Signed)
Patient called the office very anxious stating that she wants to be seen sooner than her scheduled follow up in Feb.  She states that she cannot sleep since surgery and that she remains irritable and anxious (present prior to surgery).  No immediate issues voiced.  Opening made for her to come in and see Dr. Alycia Rossetti on 08/13/13 to offer peace of mind about symptoms.  Verbalizing understanding.

## 2013-08-28 ENCOUNTER — Encounter: Payer: Self-pay | Admitting: Family Medicine

## 2013-08-28 ENCOUNTER — Emergency Department (HOSPITAL_COMMUNITY): Payer: Medicare Other

## 2013-08-28 ENCOUNTER — Ambulatory Visit: Payer: Medicare Other | Admitting: Family Medicine

## 2013-08-28 ENCOUNTER — Ambulatory Visit (INDEPENDENT_AMBULATORY_CARE_PROVIDER_SITE_OTHER): Payer: Medicare Other | Admitting: Family Medicine

## 2013-08-28 ENCOUNTER — Encounter (HOSPITAL_COMMUNITY): Payer: Self-pay

## 2013-08-28 VITALS — BP 148/90 | HR 88 | Temp 97.9°F | Wt 181.0 lb

## 2013-08-28 DIAGNOSIS — I10 Essential (primary) hypertension: Secondary | ICD-10-CM | POA: Diagnosis not present

## 2013-08-28 DIAGNOSIS — K5900 Constipation, unspecified: Secondary | ICD-10-CM | POA: Diagnosis not present

## 2013-08-28 DIAGNOSIS — R935 Abnormal findings on diagnostic imaging of other abdominal regions, including retroperitoneum: Secondary | ICD-10-CM | POA: Diagnosis not present

## 2013-08-28 DIAGNOSIS — F341 Dysthymic disorder: Secondary | ICD-10-CM

## 2013-08-28 DIAGNOSIS — F4321 Adjustment disorder with depressed mood: Secondary | ICD-10-CM

## 2013-08-28 DIAGNOSIS — R1084 Generalized abdominal pain: Secondary | ICD-10-CM | POA: Diagnosis not present

## 2013-08-28 DIAGNOSIS — R0602 Shortness of breath: Secondary | ICD-10-CM | POA: Diagnosis not present

## 2013-08-28 LAB — POCT I-STAT TROPONIN I: Troponin i, poc: 0.01 ng/mL (ref 0.00–0.08)

## 2013-08-28 MED ORDER — VILAZODONE HCL 10 & 20 & 40 MG PO KIT: PACK | ORAL | Status: DC

## 2013-08-28 MED ORDER — SODIUM CHLORIDE 0.9 % IV BOLUS (SEPSIS): 500.0000 mL | Freq: Once | INTRAVENOUS | Status: DC

## 2013-08-28 MED ORDER — IOHEXOL 350 MG/ML SOLN
100.0000 mL | Freq: Once | INTRAVENOUS | Status: AC | PRN
  Administered 2013-08-28: 100 mL via INTRAVENOUS

## 2013-08-28 NOTE — ED Provider Notes (Signed)
Jennifer [name] S 1:00AM patient discussed consignment. Patient presenting with multiple complaints primarily of right lower back pain radiating into the chest and shoulder. Patient had hysterectomy one month ago has had visits emergency room with several complaints. Recently had thorough evaluation including CT angiogram rule out her chest pain. She returns tonight with some symptoms of chest pain and heart palpitations as well as back pain. She has not been taking anything but Tylenol for her pain is post surgery. Evaluation stay has been unremarkable with normal labs and ultrasound of abdomen. CT angio is pending.  CT angiography showing signs of PE. No other concerning findings.  Pt has unchanged EKG. Negative troponin. At this time she may be discharged home with cardiology followup as well as continued follow up with her OB/GYN.      Martie Lee, PA-C 08/28/13 [phone]

## 2013-08-28 NOTE — ED Provider Notes (Signed)
Medical screening examination/treatment/procedure(s) were performed by non-physician practitioner and as supervising physician I was immediately available for consultation/collaboration.  EKG Interpretation    Date/Time:  Wednesday August 27 2013 19:56:16 EST Ventricular Rate:  98 PR Interval:  106 QRS Duration: 84 QT Interval:  341 QTC Calculation: 435 R Axis:   41 Text Interpretation:  Sinus rhythm Short PR interval Borderline T wave abnormalities Baseline wander in lead(s) II III aVL aVF Confirmed by Keandre Linden  MD, Gabrielly Mccrystal ([id number]) on 08/28/2013 3:42:22 AM             Kalman Drape, MD 08/28/13 [phone]

## 2013-08-28 NOTE — Patient Instructions (Signed)
Start your amlodipine this weekend Consider taking your premarin.   Restart colace twice a day.
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
## 2013-08-28 NOTE — Discharge Instructions (Signed)
Your lab testing, ultrasound and CT scan have not shown any signs for concern over emerging cause your symptoms. Please followup with a primary care provider, your OB/GYN surgeon and the cardiologist for continued evaluation of your symptoms.    Palpitations  A palpitation is the feeling that your heartbeat is irregular. It may feel like your heart is fluttering or skipping a beat. It may also feel like your heart is beating faster than normal. This is usually not a serious problem. In some cases, you may need more medical tests. HOME CARE  Avoid:  Caffeine in coffee, tea, soft drinks, diet pills, and energy drinks.  Chocolate.  Alcohol.  Stop smoking if you smoke.  Reduce your stress and anxiety. Try:  A method that measures bodily functions so you can learn to control them (biofeedback).  Yoga.  Meditation.  Physical activity such as swimming, jogging, or walking.  Get plenty of rest and sleep. GET HELP RIGHT AWAY IF:   You have chest pain.  You feel short of breath.  You have a very bad headache.  You feel dizzy or pass out (faint).  Your fast or irregular heartbeat continues after 24 hours.  Your palpitations occur more often. MAKE SURE YOU:   Understand these instructions.  Will watch your condition.  Will get help right away if you are not doing well or get worse. Document Released: 05/09/2008 Document Revised: 01/30/2012 Document Reviewed: 09/29/2011 [facility] Patient Information 2014 [place].    Chest Pain (Nonspecific) It is often hard to give a specific diagnosis for the cause of chest pain. There is always a chance that your pain could be related to something serious, such as a heart attack or a blood clot in the lungs. You need to follow up with your caregiver for further evaluation. CAUSES   Heartburn.  Pneumonia or bronchitis.  Anxiety or stress.  Inflammation around your heart (pericarditis) or lung (pleuritis or pleurisy).  A  blood clot in the lung.  A collapsed lung (pneumothorax). It can develop suddenly on its own (spontaneous pneumothorax) or from injury (trauma) to the chest.  Shingles infection (herpes zoster virus). The chest wall is composed of bones, muscles, and cartilage. Any of these can be the source of the pain.  The bones can be bruised by injury.  The muscles or cartilage can be strained by coughing or overwork.  The cartilage can be affected by inflammation and become sore (costochondritis). DIAGNOSIS  Lab tests or other studies, such as X-rays, electrocardiography, stress testing, or cardiac imaging, may be needed to find the cause of your pain.  TREATMENT   Treatment depends on what may be causing your chest pain. Treatment may include:  Acid blockers for heartburn.  Anti-inflammatory medicine.  Pain medicine for inflammatory conditions.  Antibiotics if an infection is present.  You may be advised to change lifestyle habits. This includes stopping smoking and avoiding alcohol, caffeine, and chocolate.  You may be advised to keep your head raised (elevated) when sleeping. This reduces the chance of acid going backward from your stomach into your esophagus.  Most of the time, nonspecific chest pain will improve within 2 to 3 days with rest and mild pain medicine. HOME CARE INSTRUCTIONS   If antibiotics were prescribed, take your antibiotics as directed. Finish them even if you start to feel better.  For the next few days, avoid physical activities that bring on chest pain. Continue physical activities as directed.  Do not smoke.  Avoid drinking  alcohol.  Only take over-the-counter or prescription medicine for pain, discomfort, or fever as directed by your caregiver.  Follow your caregiver's suggestions for further testing if your chest pain does not go away.  Keep any follow-up appointments you made. If you do not go to an appointment, you could develop lasting (chronic)  problems with pain. If there is any problem keeping an appointment, you must call to reschedule. SEEK MEDICAL CARE IF:   You think you are having problems from the medicine you are taking. Read your medicine instructions carefully.  Your chest pain does not go away, even after treatment.  You develop a rash with blisters on your chest. SEEK IMMEDIATE MEDICAL CARE IF:   You have increased chest pain or pain that spreads to your arm, neck, jaw, back, or abdomen.  You develop shortness of breath, an increasing cough, or you are coughing up blood.  You have severe back or abdominal pain, feel nauseous, or vomit.  You develop severe weakness, fainting, or chills.  You have a fever. THIS IS AN EMERGENCY. Do not wait to see if the pain will go away. Get medical help at once. Call your local emergency services (911 in [place] Do not drive yourself to the hospital. MAKE SURE YOU:   Understand these instructions.  Will watch your condition.  Will get help right away if you are not doing well or get worse. Document Released: 05/10/2005 Document Revised: 10/23/2011 Document Reviewed: 03/05/2008 [facility] Patient Information 2014 [place].   Back Pain, Adult Back pain is very common. The pain often gets better over time. The cause of back pain is usually not dangerous. Most people can learn to manage their back pain on their own.  HOME CARE   Stay active. Start with short walks on flat ground if you can. Try to walk farther each day.  Do not sit, drive, or stand in one place for more than 30 minutes. Do not stay in bed.  Do not avoid exercise or work. Activity can help your back heal faster.  Be careful when you bend or lift an object. Bend at your knees, keep the object close to you, and do not twist.  Sleep on a firm mattress. Lie on your side, and bend your knees. If you lie on your back, put a pillow under your knees.  Only take medicines as told by your doctor.  Put ice on  the injured area.  Put ice in a plastic bag.  Place a towel between your skin and the bag.  Leave the ice on for 15-20 minutes, 03-04 times a day for the first 2 to 3 days. After that, you can switch between ice and heat packs.  Ask your doctor about back exercises or massage.  Avoid feeling anxious or stressed. Find good ways to deal with stress, such as exercise. GET HELP RIGHT AWAY IF:   Your pain does not go away with rest or medicine.  Your pain does not go away in 1 week.  You have new problems.  You do not feel well.  The pain spreads into your legs.  You cannot control when you poop (bowel movement) or pee (urinate).  Your arms or legs feel weak or lose feeling (numbness).  You feel sick to your stomach (nauseous) or throw up (vomit).  You have belly (abdominal) pain.  You feel like you may pass out (faint). MAKE SURE YOU:   Understand these instructions.  Will watch your condition.  Will  get help right away if you are not doing well or get worse. Document Released: 01/17/2008 Document Revised: 10/23/2011 Document Reviewed: 12/19/2010 [facility] Patient Information 2014 [place].

## 2013-08-28 NOTE — ED Provider Notes (Signed)
Medical screening examination/treatment/procedure(s) were performed by non-physician practitioner and as supervising physician I was immediately available for consultation/collaboration.  EKG Interpretation    Date/Time:  Wednesday August 27 2013 19:56:16 EST Ventricular Rate:  98 PR Interval:  106 QRS Duration: 84 QT Interval:  341 QTC Calculation: 435 R Axis:   41 Text Interpretation:  Sinus rhythm Short PR interval Borderline T wave abnormalities Baseline wander in lead(s) II III aVL aVF Confirmed by OTTER  MD, OLGA ([id number]) on 08/28/2013 3:42:22 AM             Orpah Greek, MD 08/28/13 [phone]

## 2013-08-28 NOTE — ED Notes (Signed)
Pt refusing IV fluid. Pt also requesting to have IVs removed and to not have blood pulled back off IV. Phlebotomist in to draw labs.

## 2013-09-03 ENCOUNTER — Ambulatory Visit: Payer: Medicare Other | Admitting: Gynecologic Oncology

## 2013-09-03 DIAGNOSIS — R030 Elevated blood-pressure reading, without diagnosis of hypertension: Secondary | ICD-10-CM | POA: Diagnosis not present

## 2013-09-03 DIAGNOSIS — R5383 Other fatigue: Secondary | ICD-10-CM | POA: Diagnosis not present

## 2013-09-03 DIAGNOSIS — B9789 Other viral agents as the cause of diseases classified elsewhere: Secondary | ICD-10-CM | POA: Diagnosis not present

## 2013-09-03 DIAGNOSIS — Z888 Allergy status to other drugs, medicaments and biological substances status: Secondary | ICD-10-CM | POA: Diagnosis not present

## 2013-09-03 DIAGNOSIS — R42 Dizziness and giddiness: Secondary | ICD-10-CM | POA: Diagnosis not present

## 2013-09-03 DIAGNOSIS — R5381 Other malaise: Secondary | ICD-10-CM | POA: Diagnosis not present

## 2013-09-03 DIAGNOSIS — IMO0001 Reserved for inherently not codable concepts without codable children: Secondary | ICD-10-CM | POA: Diagnosis not present

## 2013-09-03 DIAGNOSIS — E669 Obesity, unspecified: Secondary | ICD-10-CM | POA: Diagnosis not present

## 2013-09-04 ENCOUNTER — Encounter: Payer: Self-pay | Admitting: Family Medicine

## 2013-09-04 ENCOUNTER — Ambulatory Visit (INDEPENDENT_AMBULATORY_CARE_PROVIDER_SITE_OTHER): Payer: Medicare Other | Admitting: Family Medicine

## 2013-09-04 ENCOUNTER — Telehealth: Payer: Self-pay | Admitting: Family Medicine

## 2013-09-04 VITALS — BP 126/80 | HR 115 | Temp 98.7°F | Wt 180.0 lb

## 2013-09-04 DIAGNOSIS — F418 Other specified anxiety disorders: Secondary | ICD-10-CM

## 2013-09-04 DIAGNOSIS — M25561 Pain in right knee: Secondary | ICD-10-CM

## 2013-09-04 DIAGNOSIS — J3489 Other specified disorders of nose and nasal sinuses: Secondary | ICD-10-CM | POA: Diagnosis not present

## 2013-09-04 DIAGNOSIS — R42 Dizziness and giddiness: Secondary | ICD-10-CM | POA: Diagnosis not present

## 2013-09-04 DIAGNOSIS — M25559 Pain in unspecified hip: Secondary | ICD-10-CM

## 2013-09-04 DIAGNOSIS — M25551 Pain in right hip: Secondary | ICD-10-CM

## 2013-09-04 DIAGNOSIS — M25569 Pain in unspecified knee: Secondary | ICD-10-CM

## 2013-09-04 DIAGNOSIS — R0981 Nasal congestion: Secondary | ICD-10-CM

## 2013-09-04 DIAGNOSIS — F341 Dysthymic disorder: Secondary | ICD-10-CM | POA: Diagnosis not present

## 2013-09-04 DIAGNOSIS — J358 Other chronic diseases of tonsils and adenoids: Secondary | ICD-10-CM

## 2013-09-04 MED ORDER — VILAZODONE HCL 40 MG PO TABS: 40.0000 mg | ORAL_TABLET | Freq: Every day | ORAL | Status: DC

## 2013-09-04 NOTE — Telephone Encounter (Signed)
Call pt: looked through the old imaging done in our system and in scanned documents from outside our system. Couldn't find anything specific about hip so recommend complete hip film. PUt in order to be done downstairs. She can go anytime. Also please tell we we can mail her handout on vertigo exercises.  I think Maudie Mercury has a copy. If not we can google and mail to her.
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
## 2013-09-05 ENCOUNTER — Telehealth: Payer: Self-pay | Admitting: *Deleted

## 2013-09-05 DIAGNOSIS — D320 Benign neoplasm of cerebral meninges: Secondary | ICD-10-CM | POA: Diagnosis not present

## 2013-09-05 DIAGNOSIS — D509 Iron deficiency anemia, unspecified: Secondary | ICD-10-CM | POA: Diagnosis not present

## 2013-09-05 NOTE — Telephone Encounter (Signed)
Orders placed.Jennifer [name] [place]  

## 2013-09-05 NOTE — Telephone Encounter (Signed)
Jennifer [name] called and stated that pt would like to have her MRI and xray done at [facility] since this is closer to her. Jennifer [name] cannot change the site since it is external. So do I need to reorder and fax to [facility]? Please advise.Jennifer [name] [place]

## 2013-09-05 NOTE — Telephone Encounter (Signed)
No PA required for the MRI Knee RT w/o contrast.  Oscar La, LPN

## 2013-09-05 NOTE — Telephone Encounter (Signed)
lvm informing pt to call back.Jennifer [name] [place]

## 2013-09-05 NOTE — Telephone Encounter (Signed)
Yes, unfortunately. Thank you.

## 2013-09-08 ENCOUNTER — Encounter (HOSPITAL_BASED_OUTPATIENT_CLINIC_OR_DEPARTMENT_OTHER): Payer: Self-pay | Admitting: Emergency Medicine

## 2013-09-08 ENCOUNTER — Emergency Department (HOSPITAL_BASED_OUTPATIENT_CLINIC_OR_DEPARTMENT_OTHER)
Admission: EM | Admit: 2013-09-08 | Discharge: 2013-09-08 | Disposition: A | Discharge status: Home / Self Care | Payer: Medicare Other | Attending: Emergency Medicine | Admitting: Emergency Medicine

## 2013-09-08 ENCOUNTER — Ambulatory Visit: Payer: Medicare Other | Admitting: Cardiology

## 2013-09-08 DIAGNOSIS — Z8544 Personal history of malignant neoplasm of other female genital organs: Secondary | ICD-10-CM | POA: Diagnosis not present

## 2013-09-08 DIAGNOSIS — I10 Essential (primary) hypertension: Secondary | ICD-10-CM | POA: Diagnosis not present

## 2013-09-08 DIAGNOSIS — J45909 Unspecified asthma, uncomplicated: Secondary | ICD-10-CM | POA: Diagnosis not present

## 2013-09-08 DIAGNOSIS — K219 Gastro-esophageal reflux disease without esophagitis: Secondary | ICD-10-CM | POA: Diagnosis not present

## 2013-09-08 DIAGNOSIS — Z880 Allergy status to penicillin: Secondary | ICD-10-CM | POA: Diagnosis not present

## 2013-09-08 DIAGNOSIS — R1310 Dysphagia, unspecified: Secondary | ICD-10-CM | POA: Diagnosis not present

## 2013-09-08 DIAGNOSIS — G4730 Sleep apnea, unspecified: Secondary | ICD-10-CM | POA: Insufficient documentation

## 2013-09-08 DIAGNOSIS — H5789 Other specified disorders of eye and adnexa: Secondary | ICD-10-CM | POA: Diagnosis not present

## 2013-09-08 DIAGNOSIS — Z8614 Personal history of Methicillin resistant Staphylococcus aureus infection: Secondary | ICD-10-CM | POA: Insufficient documentation

## 2013-09-08 DIAGNOSIS — IMO0002 Reserved for concepts with insufficient information to code with codable children: Secondary | ICD-10-CM | POA: Diagnosis not present

## 2013-09-08 DIAGNOSIS — G40909 Epilepsy, unspecified, not intractable, without status epilepticus: Secondary | ICD-10-CM | POA: Diagnosis not present

## 2013-09-08 DIAGNOSIS — T7840XA Allergy, unspecified, initial encounter: Secondary | ICD-10-CM

## 2013-09-08 DIAGNOSIS — Z87891 Personal history of nicotine dependence: Secondary | ICD-10-CM | POA: Diagnosis not present

## 2013-09-08 DIAGNOSIS — Z8739 Personal history of other diseases of the musculoskeletal system and connective tissue: Secondary | ICD-10-CM | POA: Insufficient documentation

## 2013-09-08 DIAGNOSIS — G8929 Other chronic pain: Secondary | ICD-10-CM | POA: Diagnosis not present

## 2013-09-08 DIAGNOSIS — Z8542 Personal history of malignant neoplasm of other parts of uterus: Secondary | ICD-10-CM | POA: Diagnosis not present

## 2013-09-08 DIAGNOSIS — M79609 Pain in unspecified limb: Secondary | ICD-10-CM | POA: Diagnosis not present

## 2013-09-08 DIAGNOSIS — R21 Rash and other nonspecific skin eruption: Secondary | ICD-10-CM | POA: Diagnosis not present

## 2013-09-08 DIAGNOSIS — Z79899 Other long term (current) drug therapy: Secondary | ICD-10-CM | POA: Insufficient documentation

## 2013-09-08 DIAGNOSIS — R1084 Generalized abdominal pain: Secondary | ICD-10-CM | POA: Diagnosis not present

## 2013-09-08 DIAGNOSIS — R490 Dysphonia: Secondary | ICD-10-CM | POA: Diagnosis not present

## 2013-09-08 DIAGNOSIS — I809 Phlebitis and thrombophlebitis of unspecified site: Secondary | ICD-10-CM | POA: Diagnosis not present

## 2013-09-08 DIAGNOSIS — Z862 Personal history of diseases of the blood and blood-forming organs and certain disorders involving the immune mechanism: Secondary | ICD-10-CM | POA: Diagnosis not present

## 2013-09-08 DIAGNOSIS — Z8639 Personal history of other endocrine, nutritional and metabolic disease: Secondary | ICD-10-CM | POA: Insufficient documentation

## 2013-09-08 DIAGNOSIS — R221 Localized swelling, mass and lump, neck: Secondary | ICD-10-CM

## 2013-09-08 DIAGNOSIS — R220 Localized swelling, mass and lump, head: Secondary | ICD-10-CM | POA: Diagnosis not present

## 2013-09-08 DIAGNOSIS — Z95818 Presence of other cardiac implants and grafts: Secondary | ICD-10-CM | POA: Insufficient documentation

## 2013-09-08 DIAGNOSIS — L299 Pruritus, unspecified: Secondary | ICD-10-CM | POA: Diagnosis not present

## 2013-09-08 MED ORDER — DIPHENHYDRAMINE HCL 25 MG PO TABS: 25.0000 mg | ORAL_TABLET | Freq: Four times a day (QID) | ORAL | Status: DC

## 2013-09-08 MED ORDER — DIPHENHYDRAMINE HCL 25 MG PO CAPS
25.0000 mg | ORAL_CAPSULE | Freq: Once | ORAL | Status: AC
  Administered 2013-09-08: 25 mg via ORAL
  Filled 2013-09-08: qty 1

## 2013-09-08 MED ORDER — FAMOTIDINE 20 MG PO TABS
20.0000 mg | ORAL_TABLET | Freq: Once | ORAL | Status: AC
  Administered 2013-09-08: 20 mg via ORAL
  Filled 2013-09-08: qty 1

## 2013-09-08 MED ORDER — FAMOTIDINE 20 MG PO TABS: 20.0000 mg | ORAL_TABLET | Freq: Two times a day (BID) | ORAL | Status: DC
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
## 2013-09-08 NOTE — Discharge Instructions (Signed)
TAKE BENADRYL AND PEPCID FOR 3 CONSECUTIVE DAYS, THEN AS NEEDED IF YOU HAVE RECURRENT SYMPTOMS.

## 2013-09-08 NOTE — ED Notes (Signed)
Pt amb to room 9 with quick steady gait in nad. Pt states she found out recently that she is allergic to milk. Last night ate a pork chop cooked by her husband that had buttermilk in the batter. Pt states she feels as if her eyes are swollen and watery, and her upper lip is swollen.

## 2013-09-08 NOTE — ED Provider Notes (Signed)
Medical screening examination/treatment/procedure(s) were performed by non-physician practitioner and as supervising physician I was immediately available for consultation/collaboration.  EKG Interpretation   None         Malvin Johns, MD 09/08/13 1100

## 2013-09-09 ENCOUNTER — Encounter (HOSPITAL_COMMUNITY): Payer: Self-pay | Admitting: Emergency Medicine

## 2013-09-09 ENCOUNTER — Emergency Department (HOSPITAL_COMMUNITY)
Admission: EM | Admit: 2013-09-09 | Discharge: 2013-09-10 | Disposition: A | Discharge status: Home / Self Care | Payer: Medicare Other | Attending: Emergency Medicine | Admitting: Emergency Medicine

## 2013-09-09 DIAGNOSIS — K219 Gastro-esophageal reflux disease without esophagitis: Secondary | ICD-10-CM | POA: Insufficient documentation

## 2013-09-09 DIAGNOSIS — Z90710 Acquired absence of both cervix and uterus: Secondary | ICD-10-CM | POA: Diagnosis not present

## 2013-09-09 DIAGNOSIS — Z8614 Personal history of Methicillin resistant Staphylococcus aureus infection: Secondary | ICD-10-CM | POA: Diagnosis not present

## 2013-09-09 DIAGNOSIS — Z9889 Other specified postprocedural states: Secondary | ICD-10-CM | POA: Insufficient documentation

## 2013-09-09 DIAGNOSIS — G8918 Other acute postprocedural pain: Secondary | ICD-10-CM | POA: Insufficient documentation

## 2013-09-09 DIAGNOSIS — I10 Essential (primary) hypertension: Secondary | ICD-10-CM | POA: Insufficient documentation

## 2013-09-09 DIAGNOSIS — G8929 Other chronic pain: Secondary | ICD-10-CM | POA: Diagnosis not present

## 2013-09-09 DIAGNOSIS — J45909 Unspecified asthma, uncomplicated: Secondary | ICD-10-CM | POA: Insufficient documentation

## 2013-09-09 DIAGNOSIS — F40298 Other specified phobia: Secondary | ICD-10-CM | POA: Insufficient documentation

## 2013-09-09 DIAGNOSIS — Z9089 Acquired absence of other organs: Secondary | ICD-10-CM | POA: Insufficient documentation

## 2013-09-09 DIAGNOSIS — Z87891 Personal history of nicotine dependence: Secondary | ICD-10-CM | POA: Diagnosis not present

## 2013-09-09 DIAGNOSIS — M79609 Pain in unspecified limb: Secondary | ICD-10-CM | POA: Diagnosis not present

## 2013-09-09 DIAGNOSIS — Z862 Personal history of diseases of the blood and blood-forming organs and certain disorders involving the immune mechanism: Secondary | ICD-10-CM | POA: Insufficient documentation

## 2013-09-09 DIAGNOSIS — G40909 Epilepsy, unspecified, not intractable, without status epilepticus: Secondary | ICD-10-CM | POA: Diagnosis not present

## 2013-09-09 DIAGNOSIS — E785 Hyperlipidemia, unspecified: Secondary | ICD-10-CM | POA: Insufficient documentation

## 2013-09-09 DIAGNOSIS — I808 Phlebitis and thrombophlebitis of other sites: Secondary | ICD-10-CM | POA: Diagnosis not present

## 2013-09-09 DIAGNOSIS — Z79899 Other long term (current) drug therapy: Secondary | ICD-10-CM | POA: Insufficient documentation

## 2013-09-09 DIAGNOSIS — Z91199 Patient's noncompliance with other medical treatment and regimen due to unspecified reason: Secondary | ICD-10-CM | POA: Diagnosis not present

## 2013-09-09 DIAGNOSIS — Z8542 Personal history of malignant neoplasm of other parts of uterus: Secondary | ICD-10-CM | POA: Insufficient documentation

## 2013-09-09 DIAGNOSIS — IMO0002 Reserved for concepts with insufficient information to code with codable children: Secondary | ICD-10-CM | POA: Diagnosis not present

## 2013-09-09 DIAGNOSIS — Z85828 Personal history of other malignant neoplasm of skin: Secondary | ICD-10-CM | POA: Diagnosis not present

## 2013-09-09 DIAGNOSIS — Z880 Allergy status to penicillin: Secondary | ICD-10-CM | POA: Insufficient documentation

## 2013-09-09 DIAGNOSIS — G4730 Sleep apnea, unspecified: Secondary | ICD-10-CM | POA: Insufficient documentation

## 2013-09-09 DIAGNOSIS — R109 Unspecified abdominal pain: Secondary | ICD-10-CM

## 2013-09-09 DIAGNOSIS — Z8673 Personal history of transient ischemic attack (TIA), and cerebral infarction without residual deficits: Secondary | ICD-10-CM | POA: Diagnosis not present

## 2013-09-09 DIAGNOSIS — R1084 Generalized abdominal pain: Secondary | ICD-10-CM | POA: Insufficient documentation

## 2013-09-09 DIAGNOSIS — Z9119 Patient's noncompliance with other medical treatment and regimen: Secondary | ICD-10-CM | POA: Diagnosis not present

## 2013-09-09 DIAGNOSIS — I809 Phlebitis and thrombophlebitis of unspecified site: Secondary | ICD-10-CM

## 2013-09-09 LAB — URINALYSIS, ROUTINE W REFLEX MICROSCOPIC
Bilirubin Urine: NEGATIVE
Glucose, UA: NEGATIVE mg/dL
Hgb urine dipstick: NEGATIVE
Ketones, ur: NEGATIVE mg/dL
Nitrite: NEGATIVE
Protein, ur: NEGATIVE mg/dL
Specific Gravity, Urine: 1.018 (ref 1.005–1.030)
Urobilinogen, UA: 0.2 mg/dL (ref 0.0–1.0)
pH: 7 (ref 5.0–8.0)

## 2013-09-09 LAB — COMPREHENSIVE METABOLIC PANEL
ALT: 25 U/L (ref 0–35)
AST: 18 U/L (ref 0–37)
Albumin: 4.1 g/dL (ref 3.5–5.2)
Alkaline Phosphatase: 80 U/L (ref 39–117)
BUN: 12 mg/dL (ref 6–23)
CO2: 24 mEq/L (ref 19–32)
Calcium: 9 mg/dL (ref 8.4–10.5)
Chloride: 103 mEq/L (ref 96–112)
Creatinine, Ser: 0.53 mg/dL (ref 0.50–1.10)
GFR calc Af Amer: >90 mL/min (ref >90)
GFR calc non Af Amer: >90 mL/min (ref >90)
Glucose, Bld: 91 mg/dL (ref 70–99)
Potassium: 3.7 mEq/L (ref 3.7–5.3)
Sodium: 141 mEq/L (ref 137–147)
Total Bilirubin: <0.2 mg/dL — ABNORMAL LOW (ref 0.3–1.2)
Total Protein: 7.8 g/dL (ref 6.0–8.3)

## 2013-09-09 LAB — CBC WITH DIFFERENTIAL/PLATELET
Basophils Absolute: 0 10*3/uL (ref 0.0–0.1)
Basophils Relative: 1 % (ref 0–1)
Eosinophils Absolute: 0.2 10*3/uL (ref 0.0–0.7)
Eosinophils Relative: 3 % (ref 0–5)
HCT: 39.8 % (ref 36.0–46.0)
Hemoglobin: 13.2 g/dL (ref 12.0–15.0)
Lymphocytes Relative: 29 % (ref 12–46)
Lymphs Abs: 1.8 10*3/uL (ref 0.7–4.0)
MCH: 29.3 pg (ref 26.0–34.0)
MCHC: 33.2 g/dL (ref 30.0–36.0)
MCV: 88.2 fL (ref 78.0–100.0)
Monocytes Absolute: 0.5 10*3/uL (ref 0.1–1.0)
Monocytes Relative: 8 % (ref 3–12)
Neutro Abs: 3.7 10*3/uL (ref 1.7–7.7)
Neutrophils Relative %: 61 % (ref 43–77)
Platelets: 221 10*3/uL (ref 150–400)
RBC: 4.51 MIL/uL (ref 3.87–5.11)
RDW: 13.9 % (ref 11.5–15.5)
WBC: 6.2 10*3/uL (ref 4.0–10.5)

## 2013-09-09 LAB — URINE MICROSCOPIC-ADD ON

## 2013-09-09 LAB — LIPASE, BLOOD: Lipase: 44 U/L (ref 11–59)

## 2013-09-09 NOTE — ED Notes (Signed)
Pt reports some ab pain to the R lower ab area with swelling. Pt post op total hyster 07/2013. Pt reports arm pain to L arm that goes to elbow area. Pt notes a red area to elbow. Pt states that she had an iron tx Friday.

## 2013-09-10 DIAGNOSIS — F4310 Post-traumatic stress disorder, unspecified: Secondary | ICD-10-CM | POA: Diagnosis not present

## 2013-09-10 MED ORDER — CLINDAMYCIN HCL 300 MG PO CAPS: 300.0000 mg | ORAL_CAPSULE | Freq: Four times a day (QID) | ORAL | Status: DC

## 2013-09-10 MED ORDER — IBUPROFEN 800 MG PO TABS: 800.0000 mg | ORAL_TABLET | Freq: Three times a day (TID) | ORAL | Status: DC

## 2013-09-10 NOTE — Discharge Instructions (Signed)
Continue to use warm compresses over your arm. Followup with your primary care provider and surgery specialist for continued evaluation and treatment of your symptoms. Return for any changing or worsening symptoms.     Phlebitis Phlebitis is soreness and puffiness (swelling) in a vein.  HOME CARE  Only take medicine as told by your doctor.  Raise (elevate) the affected limb on a pillow as told by your doctor.  Keep a warm packs on the affected vein as told by your doctor. Do not sleep with a heating pad.  Use special stockings or bandages around the area of the affected vein as told by your doctor. These will speed healing and keep the condition from coming back.  Talk to your doctor about all the medicines you take.  Get follow-up blood tests as told by your doctor.  If the phlebitis is in your legs:  Avoid standing or resting for long periods.  Keep your legs moving. Raise your legs when you sit or lie.  Do not smoke.  Follow-up with your doctor as told. GET HELP IF:  You have strange bruises or bleeding.  Your puffiness or pain in the affected area is not getting better.  You are taking special medicine to lessen puffiness (anti-inflammatory medicine) , and you get belly pain. GET HELP RIGHT AWAY IF:   The phlebitis gets worse and you have more pain, puffiness (swelling), or redness.  You have trouble breathing or have chest pain.  You have a fever. MAKE SURE YOU:   Understand these instructions.  Will watch your condition.  Will get help right away if you are not doing well or get worse. Document Released: 07/19/2009 Document Revised: 05/21/2013 Document Reviewed: 04/07/2013 [facility] Patient Information 2014 [place].    Abdominal Pain, Adult Many things can cause belly (abdominal) pain. Most times, the belly pain is not dangerous. Many cases of belly pain can be watched and treated at home. HOME CARE   Do not take medicines that help you go poop  (laxatives) unless told to by your doctor.  Only take medicine as told by your doctor.  Eat or drink as told by your doctor. Your doctor will tell you if you should be on a special diet. GET HELP IF:  You do not know what is causing your belly pain.  You have belly pain while you are sick to your stomach (nauseous) or have runny poop (diarrhea).  You have pain while you pee or poop.  Your belly pain wakes you up at night.  You have belly pain that gets worse or better when you eat.  You have belly pain that gets worse when you eat fatty foods. GET HELP RIGHT AWAY IF:   The pain does not go away within 2 hours.  You have a fever.  You keep throwing up (vomiting).  The pain changes and is only in the right or left part of the belly.  You have bloody or tarry looking poop. MAKE SURE YOU:   Understand these instructions.  Will watch your condition.  Will get help right away if you are not doing well or get worse. Document Released: 01/17/2008 Document Revised: 05/21/2013 Document Reviewed: 04/09/2013 [facility] Patient Information 2014 [place].

## 2013-09-10 NOTE — ED Provider Notes (Signed)
Medical screening examination/treatment/procedure(s) were performed by non-physician practitioner and as supervising physician I was immediately available for consultation/collaboration.    Judaea Burgoon, MD 09/10/13 0517 
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
## 2013-09-11 ENCOUNTER — Encounter: Payer: Self-pay | Admitting: Family Medicine

## 2013-09-11 ENCOUNTER — Ambulatory Visit (INDEPENDENT_AMBULATORY_CARE_PROVIDER_SITE_OTHER): Payer: Medicare Other | Admitting: Family Medicine

## 2013-09-11 VITALS — BP 147/86 | HR 126 | Wt 179.0 lb

## 2013-09-11 DIAGNOSIS — R1084 Generalized abdominal pain: Secondary | ICD-10-CM

## 2013-09-11 DIAGNOSIS — I809 Phlebitis and thrombophlebitis of unspecified site: Secondary | ICD-10-CM | POA: Diagnosis not present

## 2013-09-11 NOTE — Patient Instructions (Signed)
Phlebitis Phlebitis is soreness and swelling (inflammation) of a vein. This can occur in your arms, legs, or torso (trunk), as well as deeper inside your body. Phlebitis is usually not serious when it occurs close to the surface of the body. However, it can cause serious problems when it occurs in a vein deeper inside the body. CAUSES  Phlebitis can be triggered by various things, including:   Reduced blood flow through your veins. This can happen with:  Bed rest over a long period.  Long-distance travel.  Injury.  Surgery.  Being overweight (obese) or pregnant.  Having an IV tube put in the vein and getting certain medicines through the vein.  Cancer and cancer treatment.  Use of illegal drugs taken through the vein.  Inflammatory diseases.  Inherited (genetic) diseases that increase the risk of blood clots.  Hormone therapy, such as birth control pills. SIGNS AND SYMPTOMS   Red, tender, swollen, and painful area on your skin. Usually, the area will be long and narrow.  Firmness along the center of the affected area. This can indicate that a blood clot has formed.  Low-grade fever. DIAGNOSIS  A health care provider can usually diagnose phlebitis by examining the affected area and asking about your symptoms. To check for infection or blood clots, your health care provider may order blood tests or an ultrasound exam of the area. Blood tests and your family history may also indicate if you have an underlying genetic disease that causes blood clots. Occasionally, a piece of tissue is taken from the body (biopsy sample) if an unusual cause of phlebitis is suspected. TREATMENT  Treatment will vary depending on the severity of the condition and the area of the body affected. Treatment may include:  Use of a warm compress or heating pad.  Use of compression stockings or bandages.  Anti-inflammatory medicines.  Removal of any IV tube that may be causing the problem.  Medicines  that kill germs (antibiotics) if an infection is present.  Blood-thinning medicines if a blood clot is suspected or present.  In rare cases, surgery may be needed to remove damaged sections of vein. HOME CARE INSTRUCTIONS   Only take over-the-counter or prescription medicines as directed by your health care provider. Take all medicines exactly as prescribed.  Raise (elevate) the affected area above the level of your heart as directed by your health care provider.  Apply a warm compress or heating pad to the affected area as directed by your health care provider. Do not sleep with the heating pad.  Use compression stockings or bandages as directed. These will speed healing and prevent the condition from coming back.  If you are on blood thinners:  Get follow-up blood tests as directed by your health care provider.  Check with your health care provider before using any new medicines.  Carry a medical alert card or wear your medical alert jewelry to show that you are on blood thinners.  For phlebitis in the legs:  Avoid prolonged standing or bed rest.  Keep your legs moving. Raise your legs when sitting or lying.  Do not smoke.  Women, particularly those over the age of 35, should consider the risks and benefits of taking the contraceptive pill. This kind of hormone treatment can increase your risk for blood clots.  Follow up with your health care provider as directed. SEEK MEDICAL CARE IF:   You have unusual bruising or any bleeding problems.  Your swelling or pain in the affected area   is not improving.  You are on anti-inflammatory medicine, and you develop belly (abdominal) pain. SEEK IMMEDIATE MEDICAL CARE IF:   You have a sudden onset of chest pain or difficulty breathing.  You have a fever or persistent symptoms for more than 2 3 days.  You have a fever and your symptoms suddenly get worse. MAKE SURE YOU:  Understand these instructions.  Will watch your  condition.  Will get help right away if you are not doing well or get worse. Document Released: 07/25/2001 Document Revised: 05/21/2013 Document Reviewed: 04/07/2013 [employer] Patient Information 2014 [employer].  

## 2013-09-11 NOTE — Progress Notes (Signed)
   Subjective:    Patient ID: Jennifer [name], female    DOB: 08-08-1966, 48 y.o.   MRN: [id number]  HPI  F/U phlebitis - she say sthe redness is improving. Has been tender to touch.  Seh has been doing heating pad and warm compreses.  She has not been applying any compression.  Felt a sharp pain in the abomden a couple of days ago after making the bed. She had pulled the sheets because one of her animals was lying on the other end. It has been quite severe since then. She feels like her abdomen is bulging a little bit more on the right lower side compared to the left. She's also having some suprapubic discomfort.. She's very worried that she may have herniated her scar.no worsening or alleviating factors. Not using any pain medication for it. No fever or change in bowels.   Review of Systems     Objective:   Physical Exam  Constitutional: She is oriented to person, place, and time. She appears well-developed and well-nourished.  HENT:  Head: Normocephalic and atraumatic.  Abdominal: Soft. Bowel sounds are normal. She exhibits no distension and no mass. There is no tenderness. There is no rebound and no guarding.  Neurological: She is alert and oriented to person, place, and time.  Skin: Skin is warm and dry.  Palpable cord in left posterior forearm with some bruising over teh cord.midly tender. No erythema.  Psychiatric: She has a normal mood and affect. Her behavior is normal.          Assessment & Plan:  Phlebitis-gave reassurance. Looks like it's actually healing well. Did recommend light compression with an Ace wrap. We wrapped it for her here in the office. Continue to apply she and warm compresses. Can take Tylenol or aspirin as needed for pain relief. May take 1-2 weeks to completely resolve. Call if any signs of increased fever, warmth, pain.  Abdominal pain-gave reassurance. Nothing on exam but feels concerning. No guarding or rebound. No palpable herniation.  Keep appt with  gyn for next week for internal exam s/p hysterectomy.  Can use warm compress and NSAID prn.   Mood D/O - she has a new therapist she has started working with.

## 2013-09-12 ENCOUNTER — Telehealth: Payer: Self-pay | Admitting: *Deleted

## 2013-09-12 DIAGNOSIS — M25559 Pain in unspecified hip: Secondary | ICD-10-CM | POA: Diagnosis not present

## 2013-09-12 NOTE — Telephone Encounter (Signed)
Called to inform pt that she was approved for vybrid.Jennifer [name] Lyn[place] Pt reports that she is having some tightness in her arm. I told her to make sure that her bandage is not tight and keep an eye on it for any bruising, knots or fever in that area. Pt voiced understanding and agreed

## 2013-09-13 ENCOUNTER — Encounter: Payer: Self-pay | Admitting: Family Medicine

## 2013-09-15 ENCOUNTER — Telehealth: Payer: Self-pay | Admitting: Family Medicine

## 2013-09-15 DIAGNOSIS — I808 Phlebitis and thrombophlebitis of other sites: Secondary | ICD-10-CM | POA: Diagnosis not present

## 2013-09-15 DIAGNOSIS — M25569 Pain in unspecified knee: Secondary | ICD-10-CM | POA: Diagnosis not present

## 2013-09-15 DIAGNOSIS — I809 Phlebitis and thrombophlebitis of unspecified site: Secondary | ICD-10-CM | POA: Diagnosis not present

## 2013-09-15 DIAGNOSIS — R079 Chest pain, unspecified: Secondary | ICD-10-CM | POA: Diagnosis not present

## 2013-09-15 DIAGNOSIS — M7040 Prepatellar bursitis, unspecified knee: Secondary | ICD-10-CM | POA: Diagnosis not present

## 2013-09-15 DIAGNOSIS — M79609 Pain in unspecified limb: Secondary | ICD-10-CM | POA: Diagnosis not present

## 2013-09-15 DIAGNOSIS — R0602 Shortness of breath: Secondary | ICD-10-CM | POA: Diagnosis not present

## 2013-09-15 DIAGNOSIS — M2240 Chondromalacia patellae, unspecified knee: Secondary | ICD-10-CM | POA: Diagnosis not present

## 2013-09-15 DIAGNOSIS — M76899 Other specified enthesopathies of unspecified lower limb, excluding foot: Secondary | ICD-10-CM | POA: Diagnosis not present

## 2013-09-15 DIAGNOSIS — R0789 Other chest pain: Secondary | ICD-10-CM | POA: Diagnosis not present

## 2013-09-15 NOTE — Telephone Encounter (Signed)
Call patient: MRI of the knee show some mild prepatellar bursitis. This is a little bit of inflammation the bursa sac in front of the knee cap. There is also a little bit of arthritis and minimal grade 2 chondromalacia. This usually responds very well to physical therapy for the knee. No other issues with the ligaments and no tears in the menisci.  If she would like to start physical therapy for her knee then please let me know. There is nothing that requires any kind of surgical intervention. Also right hip x-ray is normal. There is a little bit of early arthritis but not significant. No acute fracture. No soft tissue changes noted.

## 2013-09-15 NOTE — Telephone Encounter (Signed)
lvm .Jennifer [name] [place]

## 2013-09-16 ENCOUNTER — Telehealth: Payer: Self-pay | Admitting: *Deleted

## 2013-09-16 ENCOUNTER — Telehealth: Payer: Self-pay | Admitting: Gynecologic Oncology

## 2013-09-16 NOTE — Telephone Encounter (Signed)
Pt informed of results. She was very tearful and stated that her basophils were up to 1.1 I told her that it wasn't elevated

## 2013-09-16 NOTE — Telephone Encounter (Signed)
Viibryd 40mg  tabs approved from 09/11/13/ to 09/11/14 per Yvetta Coder at [facility] [place].  Oscar La, LPN

## 2013-09-16 NOTE — Telephone Encounter (Signed)
Patient had called the office last pm and left a message at 6:30pm.  Message stated "I am having lots of abdominal pain and really need to be looked at.  The message was ended tearfully.  Advised this am by Franki Cabot when she called the office again that the office closes at 5 pm and that she would receive a return call in the am for future cases.  Message taken this am for myself to call the patient back because she "is having issues."    Returned call to the patient.  "I have not been feeling good over the past week." "A couple of days ago I had a torn feeling in my stomach."- Unable to relate this feeling with an activity or incident.  "I have gotten the hormones filled but have not taken them because I am afraid of getting a blood clot.  Yesterday, I felt like I was losing it and had to lay down.  And this morning, I have been crying over everything.  I was supposed to start a new medication, Viibryd but have not got it filled yet.  I normally get jittery with my skin crawling after taking medicines like that.  I am supposed to see Dr. Madilyn Fireman this Friday and she knows I am coming to see Dr. Alycia Rossetti."  Conversation lasted 11 min and 53 secs.  Patient had disconnected conversation.  Attempted to interject to address concerns but limited responses and attempting to answer her own questions.  She is advised to come to her appointment tomorrow with Dr. Alycia Rossetti to be assessed and to call for any questions or concerns.

## 2013-09-17 ENCOUNTER — Ambulatory Visit: Payer: Medicare Other | Attending: Gynecologic Oncology | Admitting: Gynecologic Oncology

## 2013-09-17 ENCOUNTER — Encounter: Payer: Self-pay | Admitting: Gynecologic Oncology

## 2013-09-17 VITALS — BP 161/103 | HR 108 | Temp 98.9°F | Resp 16 | Ht 62.0 in | Wt 181.6 lb

## 2013-09-17 DIAGNOSIS — Z87891 Personal history of nicotine dependence: Secondary | ICD-10-CM | POA: Diagnosis not present

## 2013-09-17 DIAGNOSIS — Z8544 Personal history of malignant neoplasm of other female genital organs: Secondary | ICD-10-CM | POA: Insufficient documentation

## 2013-09-17 DIAGNOSIS — Z803 Family history of malignant neoplasm of breast: Secondary | ICD-10-CM | POA: Insufficient documentation

## 2013-09-17 DIAGNOSIS — J45909 Unspecified asthma, uncomplicated: Secondary | ICD-10-CM | POA: Insufficient documentation

## 2013-09-17 DIAGNOSIS — G4730 Sleep apnea, unspecified: Secondary | ICD-10-CM | POA: Insufficient documentation

## 2013-09-17 DIAGNOSIS — I10 Essential (primary) hypertension: Secondary | ICD-10-CM | POA: Diagnosis not present

## 2013-09-17 DIAGNOSIS — E785 Hyperlipidemia, unspecified: Secondary | ICD-10-CM | POA: Diagnosis not present

## 2013-09-17 DIAGNOSIS — K219 Gastro-esophageal reflux disease without esophagitis: Secondary | ICD-10-CM | POA: Diagnosis not present

## 2013-09-17 DIAGNOSIS — C549 Malignant neoplasm of corpus uteri, unspecified: Secondary | ICD-10-CM | POA: Diagnosis not present

## 2013-09-17 DIAGNOSIS — R109 Unspecified abdominal pain: Secondary | ICD-10-CM | POA: Insufficient documentation

## 2013-09-17 DIAGNOSIS — Z79899 Other long term (current) drug therapy: Secondary | ICD-10-CM | POA: Insufficient documentation

## 2013-09-17 DIAGNOSIS — C541 Malignant neoplasm of endometrium: Secondary | ICD-10-CM

## 2013-09-17 DIAGNOSIS — Z9079 Acquired absence of other genital organ(s): Secondary | ICD-10-CM | POA: Insufficient documentation

## 2013-09-17 DIAGNOSIS — Z90710 Acquired absence of both cervix and uterus: Secondary | ICD-10-CM | POA: Diagnosis not present

## 2013-09-17 NOTE — Patient Instructions (Signed)
Return to clinic in 6 months.  Exercise to Lose Weight Exercise and a healthy diet may help you lose weight. Your doctor may suggest specific exercises. EXERCISE IDEAS AND TIPS  Choose low-cost things you enjoy doing, such as walking, bicycling, or exercising to workout videos.  Take stairs instead of the elevator.  Walk during your lunch break.  Park your car further away from work or school.  Go to a gym or an exercise class.  Start with 5 to 10 minutes of exercise each day. Build up to 30 minutes of exercise 4 to 6 days a week.  Wear shoes with good support and comfortable clothes.  Stretch before and after working out.  Work out until you breathe harder and your heart beats faster.  Drink extra water when you exercise.  Do not do so much that you hurt yourself, feel dizzy, or get very short of breath. Exercises that burn about 150 calories:  Running 1  miles in 15 minutes.  Playing volleyball for 45 to 60 minutes.  Washing and waxing a car for 45 to 60 minutes.  Playing touch football for 45 minutes.  Walking 1  miles in 35 minutes.  Pushing a stroller 1  miles in 30 minutes.  Playing basketball for 30 minutes.  Raking leaves for 30 minutes.  Bicycling 5 miles in 30 minutes.  Walking 2 miles in 30 minutes.  Dancing for 30 minutes.  Shoveling snow for 15 minutes.  Swimming laps for 20 minutes.  Walking up stairs for 15 minutes.  Bicycling 4 miles in 15 minutes.  Gardening for 30 to 45 minutes.  Jumping rope for 15 minutes.  Washing windows or floors for 45 to 60 minutes. Document Released: 09/02/2010 Document Revised: 10/23/2011 Document Reviewed: 09/02/2010 [facility] Patient Information 2014 [place], [place].
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
## 2013-09-18 ENCOUNTER — Telehealth: Payer: Self-pay | Admitting: Gynecologic Oncology

## 2013-09-18 ENCOUNTER — Telehealth: Payer: Self-pay | Admitting: *Deleted

## 2013-09-18 DIAGNOSIS — F4310 Post-traumatic stress disorder, unspecified: Secondary | ICD-10-CM | POA: Diagnosis not present

## 2013-09-18 NOTE — Telephone Encounter (Signed)
Returned call to patient.  Patient has question about using feminine care products.  She had used vinegar douches in the past between her cycles.  Since she has had a hysterectomy, advised that she may not need to perform vinegar douches to "feel clean".  Advised to call if symptoms arise so potential treatment options can be discussed.  She is advised to call for any questions or concerns.

## 2013-09-18 NOTE — Telephone Encounter (Signed)
Pt called [facility] requesting call back. Returned pt's call.Inquired how I could help, pt advised she would feel more comfortable speaking with Lenna Sciara, NP. Discussed with pt she is in Hospital making rounds and I would be happy to give her a message.Pt advised she had one question, it was yes or no and she would feel more comfortable speaking with Melissa. Informed pt I would give her message. No further concerns at this time.

## 2013-09-19 ENCOUNTER — Ambulatory Visit: Payer: Medicare Other | Admitting: Family Medicine

## 2013-09-20 NOTE — Telephone Encounter (Signed)
ok 

## 2013-09-21 DIAGNOSIS — D72829 Elevated white blood cell count, unspecified: Secondary | ICD-10-CM | POA: Diagnosis not present

## 2013-09-21 DIAGNOSIS — E860 Dehydration: Secondary | ICD-10-CM | POA: Diagnosis not present

## 2013-09-21 DIAGNOSIS — R110 Nausea: Secondary | ICD-10-CM | POA: Diagnosis not present

## 2013-09-21 DIAGNOSIS — F459 Somatoform disorder, unspecified: Secondary | ICD-10-CM | POA: Diagnosis not present

## 2013-09-21 DIAGNOSIS — R109 Unspecified abdominal pain: Secondary | ICD-10-CM | POA: Diagnosis not present

## 2013-09-21 DIAGNOSIS — R7309 Other abnormal glucose: Secondary | ICD-10-CM | POA: Diagnosis not present

## 2013-09-21 DIAGNOSIS — R197 Diarrhea, unspecified: Secondary | ICD-10-CM | POA: Diagnosis not present

## 2013-09-21 DIAGNOSIS — R1013 Epigastric pain: Secondary | ICD-10-CM | POA: Diagnosis not present

## 2013-09-21 DIAGNOSIS — E876 Hypokalemia: Secondary | ICD-10-CM | POA: Diagnosis not present

## 2013-09-21 DIAGNOSIS — R000 Tachycardia, unspecified: Secondary | ICD-10-CM | POA: Diagnosis not present

## 2013-09-21 DIAGNOSIS — Z8673 Personal history of transient ischemic attack (TIA), and cerebral infarction without residual deficits: Secondary | ICD-10-CM | POA: Diagnosis not present

## 2013-09-21 DIAGNOSIS — R112 Nausea with vomiting, unspecified: Secondary | ICD-10-CM | POA: Diagnosis not present

## 2013-09-21 DIAGNOSIS — I498 Other specified cardiac arrhythmias: Secondary | ICD-10-CM | POA: Diagnosis not present

## 2013-09-21 DIAGNOSIS — R1110 Vomiting, unspecified: Secondary | ICD-10-CM | POA: Diagnosis not present

## 2013-09-21 DIAGNOSIS — I10 Essential (primary) hypertension: Secondary | ICD-10-CM | POA: Diagnosis not present

## 2013-09-22 DIAGNOSIS — E860 Dehydration: Secondary | ICD-10-CM | POA: Diagnosis not present

## 2013-09-22 DIAGNOSIS — R197 Diarrhea, unspecified: Secondary | ICD-10-CM | POA: Diagnosis not present

## 2013-09-22 DIAGNOSIS — R112 Nausea with vomiting, unspecified: Secondary | ICD-10-CM | POA: Diagnosis not present

## 2013-09-22 DIAGNOSIS — R1013 Epigastric pain: Secondary | ICD-10-CM | POA: Diagnosis not present

## 2013-09-22 DIAGNOSIS — R000 Tachycardia, unspecified: Secondary | ICD-10-CM | POA: Diagnosis not present

## 2013-09-22 DIAGNOSIS — R109 Unspecified abdominal pain: Secondary | ICD-10-CM | POA: Diagnosis not present

## 2013-09-22 DIAGNOSIS — R1084 Generalized abdominal pain: Secondary | ICD-10-CM | POA: Diagnosis not present

## 2013-09-23 DIAGNOSIS — R109 Unspecified abdominal pain: Secondary | ICD-10-CM | POA: Diagnosis not present

## 2013-09-23 DIAGNOSIS — R000 Tachycardia, unspecified: Secondary | ICD-10-CM | POA: Diagnosis not present

## 2013-09-23 DIAGNOSIS — R1084 Generalized abdominal pain: Secondary | ICD-10-CM | POA: Diagnosis not present

## 2013-09-23 DIAGNOSIS — R112 Nausea with vomiting, unspecified: Secondary | ICD-10-CM | POA: Diagnosis not present

## 2013-09-23 DIAGNOSIS — R197 Diarrhea, unspecified: Secondary | ICD-10-CM | POA: Diagnosis not present

## 2013-09-24 ENCOUNTER — Ambulatory Visit: Payer: Medicare Other | Admitting: Family Medicine

## 2013-09-24 ENCOUNTER — Encounter: Payer: Self-pay | Admitting: *Deleted

## 2013-09-24 DIAGNOSIS — R112 Nausea with vomiting, unspecified: Secondary | ICD-10-CM | POA: Diagnosis not present

## 2013-09-24 DIAGNOSIS — E269 Hyperaldosteronism, unspecified: Secondary | ICD-10-CM | POA: Diagnosis not present

## 2013-09-24 DIAGNOSIS — I10 Essential (primary) hypertension: Secondary | ICD-10-CM | POA: Diagnosis not present

## 2013-09-24 DIAGNOSIS — F411 Generalized anxiety disorder: Secondary | ICD-10-CM | POA: Diagnosis not present

## 2013-09-24 DIAGNOSIS — Z79899 Other long term (current) drug therapy: Secondary | ICD-10-CM | POA: Diagnosis not present

## 2013-09-24 DIAGNOSIS — K219 Gastro-esophageal reflux disease without esophagitis: Secondary | ICD-10-CM | POA: Diagnosis not present

## 2013-09-24 DIAGNOSIS — Z880 Allergy status to penicillin: Secondary | ICD-10-CM | POA: Diagnosis not present

## 2013-09-24 DIAGNOSIS — G40909 Epilepsy, unspecified, not intractable, without status epilepticus: Secondary | ICD-10-CM | POA: Diagnosis not present

## 2013-09-24 DIAGNOSIS — R110 Nausea: Secondary | ICD-10-CM | POA: Diagnosis not present

## 2013-09-24 DIAGNOSIS — Z885 Allergy status to narcotic agent status: Secondary | ICD-10-CM | POA: Diagnosis not present

## 2013-09-24 DIAGNOSIS — Z886 Allergy status to analgesic agent status: Secondary | ICD-10-CM | POA: Diagnosis not present

## 2013-09-24 DIAGNOSIS — R197 Diarrhea, unspecified: Secondary | ICD-10-CM | POA: Diagnosis not present

## 2013-09-24 DIAGNOSIS — Z8544 Personal history of malignant neoplasm of other female genital organs: Secondary | ICD-10-CM | POA: Diagnosis not present

## 2013-09-24 DIAGNOSIS — Z87891 Personal history of nicotine dependence: Secondary | ICD-10-CM | POA: Diagnosis not present

## 2013-09-24 DIAGNOSIS — E785 Hyperlipidemia, unspecified: Secondary | ICD-10-CM | POA: Diagnosis not present

## 2013-09-24 DIAGNOSIS — G43909 Migraine, unspecified, not intractable, without status migrainosus: Secondary | ICD-10-CM | POA: Diagnosis not present

## 2013-09-24 DIAGNOSIS — Z883 Allergy status to other anti-infective agents status: Secondary | ICD-10-CM | POA: Diagnosis not present

## 2013-09-24 DIAGNOSIS — R109 Unspecified abdominal pain: Secondary | ICD-10-CM | POA: Diagnosis not present

## 2013-09-24 DIAGNOSIS — K2270 Barrett's esophagus without dysplasia: Secondary | ICD-10-CM | POA: Diagnosis not present

## 2013-09-24 DIAGNOSIS — Z882 Allergy status to sulfonamides status: Secondary | ICD-10-CM | POA: Diagnosis not present

## 2013-09-24 DIAGNOSIS — Z888 Allergy status to other drugs, medicaments and biological substances status: Secondary | ICD-10-CM | POA: Diagnosis not present

## 2013-09-24 DIAGNOSIS — R000 Tachycardia, unspecified: Secondary | ICD-10-CM | POA: Diagnosis not present

## 2013-09-24 DIAGNOSIS — R1084 Generalized abdominal pain: Secondary | ICD-10-CM | POA: Diagnosis not present

## 2013-09-24 LAB — CBC AND DIFFERENTIAL
HCT: 36 % (ref 36–46)
Hemoglobin: 12.1 g/dL (ref 12.0–16.0)
Platelets: 170 10*3/uL (ref 150–399)
WBC: 3.5 10^3/mL

## 2013-09-24 NOTE — Telephone Encounter (Signed)
Hi Jennifer [name]. Not sure why but this note won't let me close it. It says you have something unfinished that won't let me close it

## 2013-09-25 ENCOUNTER — Ambulatory Visit: Payer: Medicare Other | Admitting: Family Medicine

## 2013-09-25 DIAGNOSIS — R142 Eructation: Secondary | ICD-10-CM | POA: Diagnosis not present

## 2013-09-25 DIAGNOSIS — R141 Gas pain: Secondary | ICD-10-CM | POA: Diagnosis not present

## 2013-09-25 DIAGNOSIS — R1084 Generalized abdominal pain: Secondary | ICD-10-CM | POA: Diagnosis not present

## 2013-09-26 ENCOUNTER — Encounter (HOSPITAL_BASED_OUTPATIENT_CLINIC_OR_DEPARTMENT_OTHER): Payer: Self-pay | Admitting: Emergency Medicine

## 2013-09-26 ENCOUNTER — Emergency Department (HOSPITAL_BASED_OUTPATIENT_CLINIC_OR_DEPARTMENT_OTHER): Payer: Medicare Other

## 2013-09-26 ENCOUNTER — Emergency Department (HOSPITAL_BASED_OUTPATIENT_CLINIC_OR_DEPARTMENT_OTHER)
Admission: EM | Admit: 2013-09-26 | Discharge: 2013-09-26 | Disposition: A | Discharge status: Home / Self Care | Payer: Medicare Other | Attending: Emergency Medicine | Admitting: Emergency Medicine

## 2013-09-26 DIAGNOSIS — Z87891 Personal history of nicotine dependence: Secondary | ICD-10-CM | POA: Diagnosis not present

## 2013-09-26 DIAGNOSIS — R0602 Shortness of breath: Secondary | ICD-10-CM

## 2013-09-26 DIAGNOSIS — Z8639 Personal history of other endocrine, nutritional and metabolic disease: Secondary | ICD-10-CM | POA: Insufficient documentation

## 2013-09-26 DIAGNOSIS — Z8542 Personal history of malignant neoplasm of other parts of uterus: Secondary | ICD-10-CM | POA: Diagnosis not present

## 2013-09-26 DIAGNOSIS — R079 Chest pain, unspecified: Secondary | ICD-10-CM | POA: Diagnosis not present

## 2013-09-26 DIAGNOSIS — IMO0002 Reserved for concepts with insufficient information to code with codable children: Secondary | ICD-10-CM | POA: Insufficient documentation

## 2013-09-26 DIAGNOSIS — G4730 Sleep apnea, unspecified: Secondary | ICD-10-CM | POA: Diagnosis not present

## 2013-09-26 DIAGNOSIS — K219 Gastro-esophageal reflux disease without esophagitis: Secondary | ICD-10-CM | POA: Diagnosis not present

## 2013-09-26 DIAGNOSIS — R0789 Other chest pain: Secondary | ICD-10-CM | POA: Insufficient documentation

## 2013-09-26 DIAGNOSIS — Z8614 Personal history of Methicillin resistant Staphylococcus aureus infection: Secondary | ICD-10-CM | POA: Diagnosis not present

## 2013-09-26 DIAGNOSIS — J45901 Unspecified asthma with (acute) exacerbation: Secondary | ICD-10-CM | POA: Diagnosis not present

## 2013-09-26 DIAGNOSIS — G40909 Epilepsy, unspecified, not intractable, without status epilepticus: Secondary | ICD-10-CM | POA: Diagnosis not present

## 2013-09-26 DIAGNOSIS — R059 Cough, unspecified: Secondary | ICD-10-CM | POA: Diagnosis not present

## 2013-09-26 DIAGNOSIS — Z792 Long term (current) use of antibiotics: Secondary | ICD-10-CM | POA: Diagnosis not present

## 2013-09-26 DIAGNOSIS — Z880 Allergy status to penicillin: Secondary | ICD-10-CM | POA: Diagnosis not present

## 2013-09-26 DIAGNOSIS — Z8659 Personal history of other mental and behavioral disorders: Secondary | ICD-10-CM | POA: Insufficient documentation

## 2013-09-26 DIAGNOSIS — Z9981 Dependence on supplemental oxygen: Secondary | ICD-10-CM | POA: Insufficient documentation

## 2013-09-26 DIAGNOSIS — I10 Essential (primary) hypertension: Secondary | ICD-10-CM | POA: Diagnosis not present

## 2013-09-26 DIAGNOSIS — Z79899 Other long term (current) drug therapy: Secondary | ICD-10-CM | POA: Insufficient documentation

## 2013-09-26 DIAGNOSIS — Z862 Personal history of diseases of the blood and blood-forming organs and certain disorders involving the immune mechanism: Secondary | ICD-10-CM | POA: Diagnosis not present

## 2013-09-26 DIAGNOSIS — R05 Cough: Secondary | ICD-10-CM | POA: Diagnosis not present

## 2013-09-26 LAB — CBC WITH DIFFERENTIAL/PLATELET
Basophils Absolute: 0 10*3/uL (ref 0.0–0.1)
Basophils Relative: 0 % (ref 0–1)
Eosinophils Absolute: 0.2 10*3/uL (ref 0.0–0.7)
Eosinophils Relative: 4 % (ref 0–5)
HCT: 40.8 % (ref 36.0–46.0)
Hemoglobin: 13.2 g/dL (ref 12.0–15.0)
Lymphocytes Relative: 32 % (ref 12–46)
Lymphs Abs: 1.4 10*3/uL (ref 0.7–4.0)
MCH: 28.6 pg (ref 26.0–34.0)
MCHC: 32.4 g/dL (ref 30.0–36.0)
MCV: 88.5 fL (ref 78.0–100.0)
Monocytes Absolute: 0.5 10*3/uL (ref 0.1–1.0)
Monocytes Relative: 10 % (ref 3–12)
Neutro Abs: 2.4 10*3/uL (ref 1.7–7.7)
Neutrophils Relative %: 54 % (ref 43–77)
Platelets: 214 10*3/uL (ref 150–400)
RBC: 4.61 MIL/uL (ref 3.87–5.11)
RDW: 14 % (ref 11.5–15.5)
WBC: 4.5 10*3/uL (ref 4.0–10.5)

## 2013-09-26 LAB — BASIC METABOLIC PANEL
BUN: 9 mg/dL (ref 6–23)
CO2: 25 mEq/L (ref 19–32)
Calcium: 9.5 mg/dL (ref 8.4–10.5)
Chloride: 105 mEq/L (ref 96–112)
Creatinine, Ser: 0.6 mg/dL (ref 0.50–1.10)
GFR calc Af Amer: >90 mL/min (ref >90)
GFR calc non Af Amer: >90 mL/min (ref >90)
Glucose, Bld: 111 mg/dL — ABNORMAL HIGH (ref 70–99)
Potassium: 3.5 mEq/L — ABNORMAL LOW (ref 3.7–5.3)
Sodium: 146 mEq/L (ref 137–147)

## 2013-09-26 LAB — POCT I-STAT TROPONIN I: Troponin i, poc: 0 ng/mL (ref 0.00–0.08)

## 2013-09-26 MED ORDER — POTASSIUM CHLORIDE 20 MEQ/15ML (10%) PO LIQD
40.0000 meq | Freq: Once | ORAL | Status: AC
  Administered 2013-09-26: 40 meq via ORAL
  Filled 2013-09-26: qty 30
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
## 2013-09-26 NOTE — Discharge Instructions (Signed)
Follow up with your doctor for further evaluation. Refer to attached documents for more information.  °

## 2013-09-26 NOTE — ED Notes (Signed)
Sob. She was discharged 2 days ago after being admitted. States she has a productive cough. Pressure on her chest for the past few days. She thinks she aspirated.

## 2013-09-27 NOTE — ED Provider Notes (Signed)
Medical screening examination/treatment/procedure(s) were performed by non-physician practitioner and as supervising physician I was immediately available for consultation/collaboration.  EKG Interpretation   None         Houston Siren III, MD 09/27/13 1453

## 2013-09-29 DIAGNOSIS — R0789 Other chest pain: Secondary | ICD-10-CM | POA: Diagnosis not present

## 2013-09-29 DIAGNOSIS — R1310 Dysphagia, unspecified: Secondary | ICD-10-CM | POA: Diagnosis not present

## 2013-09-29 DIAGNOSIS — I10 Essential (primary) hypertension: Secondary | ICD-10-CM | POA: Diagnosis not present

## 2013-09-29 DIAGNOSIS — R079 Chest pain, unspecified: Secondary | ICD-10-CM | POA: Diagnosis not present

## 2013-09-29 DIAGNOSIS — R0602 Shortness of breath: Secondary | ICD-10-CM | POA: Diagnosis not present

## 2013-09-29 DIAGNOSIS — R9431 Abnormal electrocardiogram [ECG] [EKG]: Secondary | ICD-10-CM | POA: Diagnosis not present

## 2013-09-29 DIAGNOSIS — F458 Other somatoform disorders: Secondary | ICD-10-CM | POA: Diagnosis not present

## 2013-09-29 DIAGNOSIS — R000 Tachycardia, unspecified: Secondary | ICD-10-CM | POA: Diagnosis not present

## 2013-09-29 DIAGNOSIS — R109 Unspecified abdominal pain: Secondary | ICD-10-CM | POA: Diagnosis not present

## 2013-10-01 ENCOUNTER — Telehealth: Payer: Self-pay | Admitting: *Deleted

## 2013-10-01 DIAGNOSIS — I10 Essential (primary) hypertension: Secondary | ICD-10-CM | POA: Diagnosis not present

## 2013-10-01 DIAGNOSIS — F411 Generalized anxiety disorder: Secondary | ICD-10-CM | POA: Diagnosis not present

## 2013-10-01 NOTE — Telephone Encounter (Signed)
Pt was recently d/c'ed from hospital and was told that she will need to have a cardiac monitor placed due to the palpitations

## 2013-10-03 ENCOUNTER — Ambulatory Visit: Payer: Medicare Other | Admitting: Family Medicine

## 2013-10-06 ENCOUNTER — Encounter: Payer: Self-pay | Admitting: Family Medicine

## 2013-10-06 DIAGNOSIS — E559 Vitamin D deficiency, unspecified: Secondary | ICD-10-CM | POA: Diagnosis not present

## 2013-10-06 DIAGNOSIS — Z79899 Other long term (current) drug therapy: Secondary | ICD-10-CM | POA: Diagnosis not present

## 2013-10-06 DIAGNOSIS — R109 Unspecified abdominal pain: Secondary | ICD-10-CM | POA: Diagnosis not present

## 2013-10-06 DIAGNOSIS — I10 Essential (primary) hypertension: Secondary | ICD-10-CM | POA: Diagnosis not present

## 2013-10-06 DIAGNOSIS — R7309 Other abnormal glucose: Secondary | ICD-10-CM | POA: Diagnosis not present

## 2013-10-06 DIAGNOSIS — R5381 Other malaise: Secondary | ICD-10-CM | POA: Diagnosis not present

## 2013-10-06 DIAGNOSIS — Z8673 Personal history of transient ischemic attack (TIA), and cerebral infarction without residual deficits: Secondary | ICD-10-CM | POA: Diagnosis not present

## 2013-10-06 DIAGNOSIS — J019 Acute sinusitis, unspecified: Secondary | ICD-10-CM | POA: Diagnosis not present

## 2013-10-06 DIAGNOSIS — E269 Hyperaldosteronism, unspecified: Secondary | ICD-10-CM | POA: Diagnosis not present

## 2013-10-06 DIAGNOSIS — R1013 Epigastric pain: Secondary | ICD-10-CM | POA: Diagnosis not present

## 2013-10-06 DIAGNOSIS — R5383 Other fatigue: Secondary | ICD-10-CM | POA: Diagnosis not present

## 2013-10-06 DIAGNOSIS — F339 Major depressive disorder, recurrent, unspecified: Secondary | ICD-10-CM | POA: Diagnosis not present

## 2013-10-06 DIAGNOSIS — R079 Chest pain, unspecified: Secondary | ICD-10-CM | POA: Diagnosis not present

## 2013-10-06 DIAGNOSIS — K560 Paralytic ileus: Secondary | ICD-10-CM | POA: Diagnosis not present

## 2013-10-08 ENCOUNTER — Encounter: Payer: Self-pay | Admitting: *Deleted

## 2013-10-08 ENCOUNTER — Telehealth: Payer: Self-pay | Admitting: Gynecologic Oncology

## 2013-10-08 DIAGNOSIS — I10 Essential (primary) hypertension: Secondary | ICD-10-CM | POA: Diagnosis not present

## 2013-10-08 DIAGNOSIS — M7989 Other specified soft tissue disorders: Secondary | ICD-10-CM | POA: Diagnosis not present

## 2013-10-08 DIAGNOSIS — M25519 Pain in unspecified shoulder: Secondary | ICD-10-CM | POA: Diagnosis not present

## 2013-10-08 NOTE — Telephone Encounter (Signed)
Returned call to patient.  Phone called lasted 20 minutes.  She called reporting a recent admission to [facility] for stomach pain, nausea, and vomiting.  On admission, she was having watery stools and was passing flatus.  She states she has two xrays while admitted that showed possible ileus.  She was discharged and states she was told to follow up with her physicians with no instructions given on her diet.  She states that she is trying to get an MRI per her gastroenterologist to assess for adhesions but it has not been approved by her insurance.  Passing flatus currently and having more solid bowel movements.  No emesis reported.  Intermittent abdominal pain at times.  Advised to change diet to clear liquids if abdominal pain developed again and to notify the office.  She is to contact [facility] so her xray results can be faxed.  Reportable signs and symptoms reviewed.  No vaginal bleeding reported.  Advised to call the office in several days with an update.

## 2013-10-09 DIAGNOSIS — R1310 Dysphagia, unspecified: Secondary | ICD-10-CM | POA: Diagnosis not present

## 2013-10-09 IMAGING — CR DG CHEST 2V
2 series · 2 of 2 positions shown · non-contrast
Comparison: Chest x-ray of 04/19/2012

CLINICAL DATA: Productive cough, chest tightness

CHEST - 2 VIEW

[view not recorded (1 of 2)]
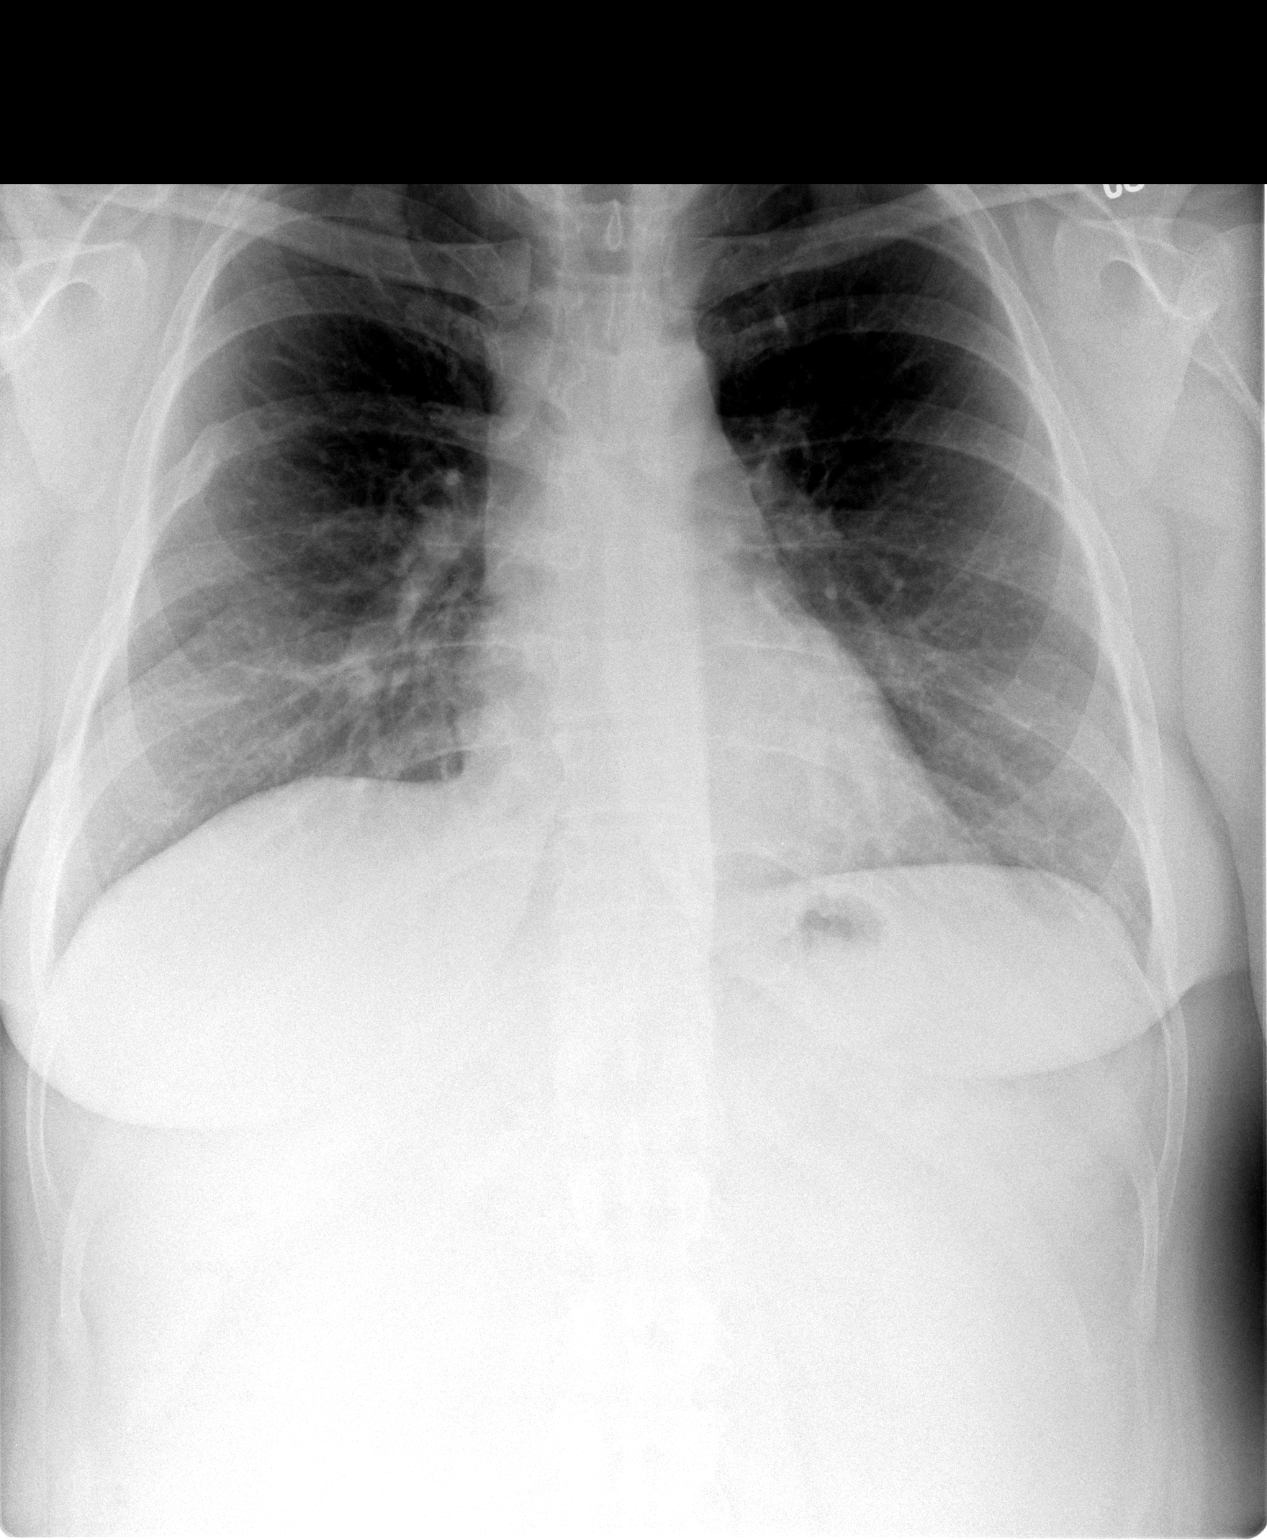

[view not recorded (2 of 2)]
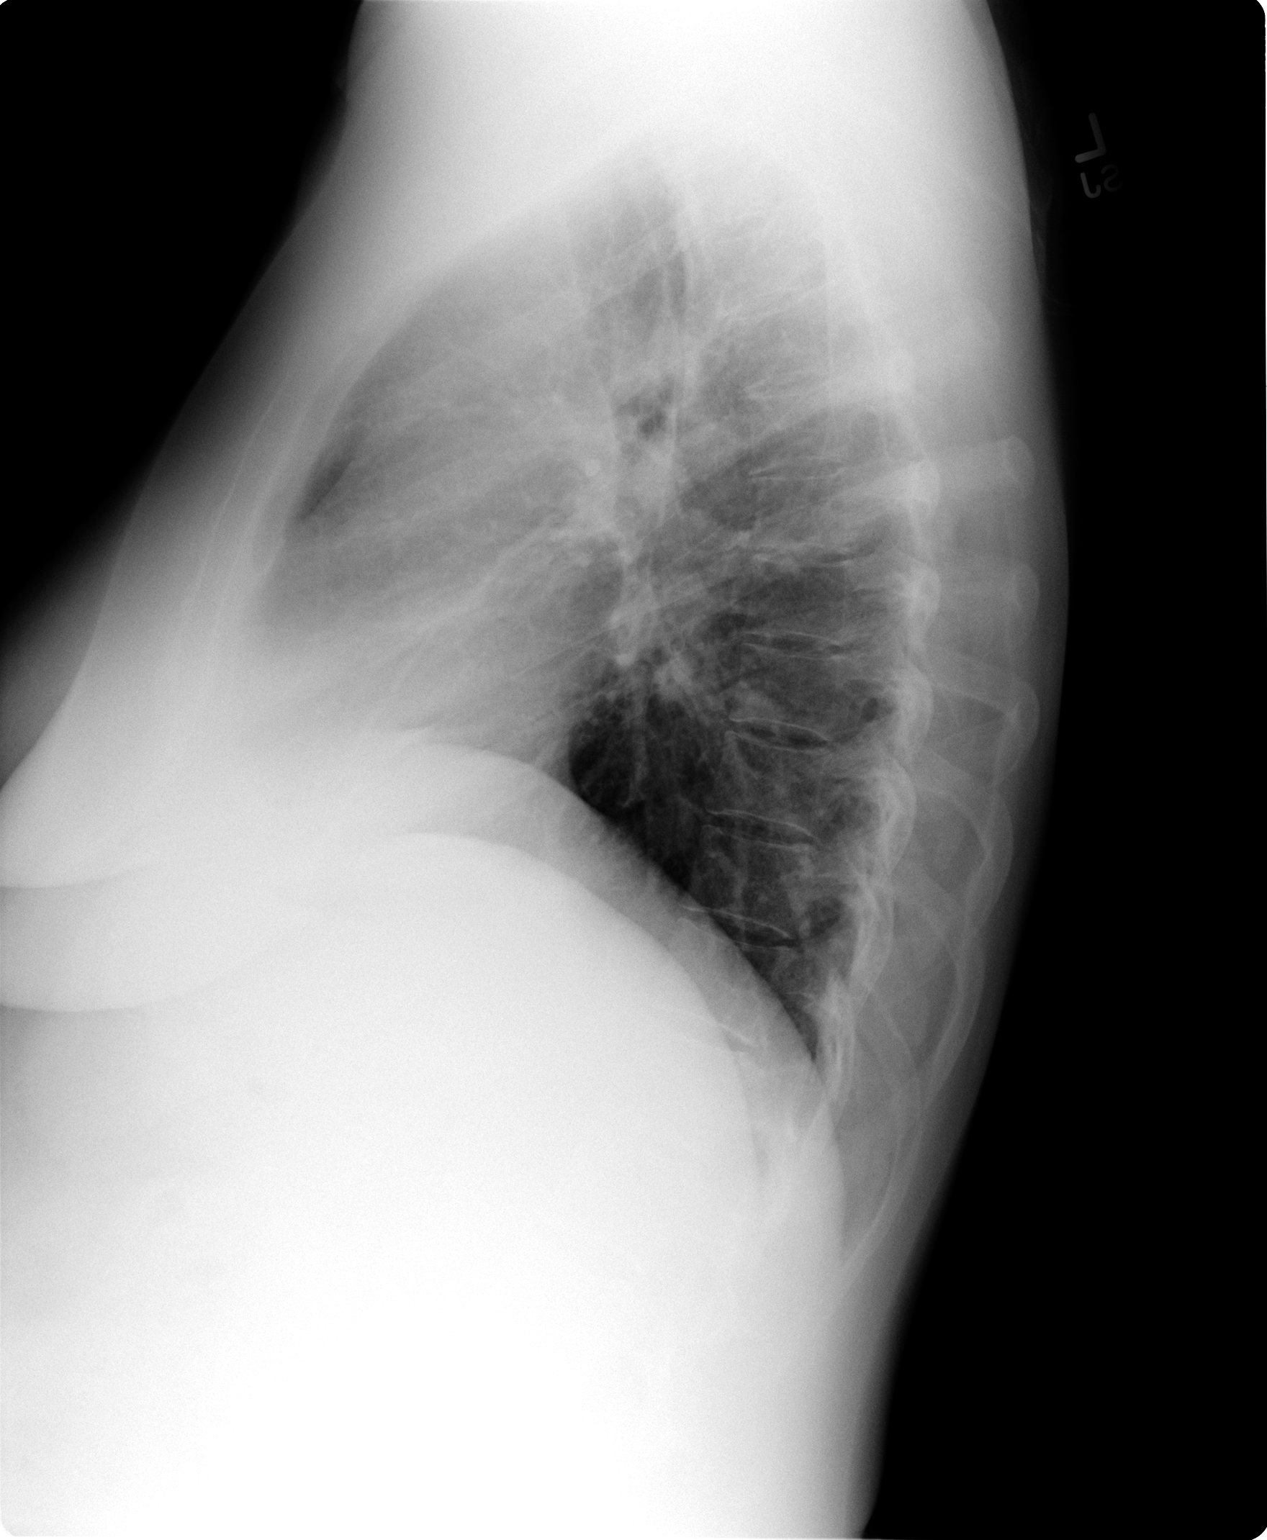

[2 of 2 positions shown; findings below may reference images not displayed]

FINDINGS: No active infiltrate or effusion is seen.  There is
slight prominence of perihilar markings with some peribronchial
thickening which may indicate bronchitis.  Mediastinal contours are
stable.  The heart is within normal limits in size.  An old right
posterolateral sixth rib fracture is noted.
IMPRESSION: No pneumonia.  Possible bronchitis.

## 2013-10-10 ENCOUNTER — Ambulatory Visit: Payer: Medicare Other | Admitting: Cardiology

## 2013-10-11 ENCOUNTER — Encounter (HOSPITAL_BASED_OUTPATIENT_CLINIC_OR_DEPARTMENT_OTHER): Payer: Self-pay | Admitting: Emergency Medicine

## 2013-10-11 ENCOUNTER — Emergency Department (HOSPITAL_BASED_OUTPATIENT_CLINIC_OR_DEPARTMENT_OTHER)
Admission: EM | Admit: 2013-10-11 | Discharge: 2013-10-11 | Disposition: A | Discharge status: Home / Self Care | Payer: Medicare Other | Attending: Emergency Medicine | Admitting: Emergency Medicine

## 2013-10-11 DIAGNOSIS — Z9889 Other specified postprocedural states: Secondary | ICD-10-CM | POA: Diagnosis not present

## 2013-10-11 DIAGNOSIS — Z8742 Personal history of other diseases of the female genital tract: Secondary | ICD-10-CM | POA: Insufficient documentation

## 2013-10-11 DIAGNOSIS — G4730 Sleep apnea, unspecified: Secondary | ICD-10-CM | POA: Diagnosis not present

## 2013-10-11 DIAGNOSIS — Z9981 Dependence on supplemental oxygen: Secondary | ICD-10-CM | POA: Diagnosis not present

## 2013-10-11 DIAGNOSIS — Z8639 Personal history of other endocrine, nutritional and metabolic disease: Secondary | ICD-10-CM | POA: Diagnosis not present

## 2013-10-11 DIAGNOSIS — Z792 Long term (current) use of antibiotics: Secondary | ICD-10-CM | POA: Insufficient documentation

## 2013-10-11 DIAGNOSIS — IMO0002 Reserved for concepts with insufficient information to code with codable children: Secondary | ICD-10-CM | POA: Insufficient documentation

## 2013-10-11 DIAGNOSIS — F411 Generalized anxiety disorder: Secondary | ICD-10-CM | POA: Diagnosis not present

## 2013-10-11 DIAGNOSIS — Z862 Personal history of diseases of the blood and blood-forming organs and certain disorders involving the immune mechanism: Secondary | ICD-10-CM | POA: Diagnosis not present

## 2013-10-11 DIAGNOSIS — R0602 Shortness of breath: Secondary | ICD-10-CM | POA: Diagnosis not present

## 2013-10-11 DIAGNOSIS — Z87891 Personal history of nicotine dependence: Secondary | ICD-10-CM | POA: Diagnosis not present

## 2013-10-11 DIAGNOSIS — I10 Essential (primary) hypertension: Secondary | ICD-10-CM | POA: Diagnosis not present

## 2013-10-11 DIAGNOSIS — K219 Gastro-esophageal reflux disease without esophagitis: Secondary | ICD-10-CM | POA: Insufficient documentation

## 2013-10-11 DIAGNOSIS — Z880 Allergy status to penicillin: Secondary | ICD-10-CM | POA: Diagnosis not present

## 2013-10-11 DIAGNOSIS — J3489 Other specified disorders of nose and nasal sinuses: Secondary | ICD-10-CM | POA: Diagnosis not present

## 2013-10-11 DIAGNOSIS — J45901 Unspecified asthma with (acute) exacerbation: Secondary | ICD-10-CM | POA: Insufficient documentation

## 2013-10-11 DIAGNOSIS — Z79899 Other long term (current) drug therapy: Secondary | ICD-10-CM | POA: Diagnosis not present

## 2013-10-11 DIAGNOSIS — R0981 Nasal congestion: Secondary | ICD-10-CM

## 2013-10-11 DIAGNOSIS — Z791 Long term (current) use of non-steroidal anti-inflammatories (NSAID): Secondary | ICD-10-CM | POA: Insufficient documentation

## 2013-10-11 DIAGNOSIS — Z8614 Personal history of Methicillin resistant Staphylococcus aureus infection: Secondary | ICD-10-CM | POA: Diagnosis not present

## 2013-10-11 DIAGNOSIS — Z8542 Personal history of malignant neoplasm of other parts of uterus: Secondary | ICD-10-CM | POA: Insufficient documentation

## 2013-10-11 IMAGING — US US ABDOMEN COMPLETE
1 series · 14 of 25 positions shown · non-contrast
Comparison: Ultrasound the abdomen on 01/12/2012, CT of the
abdomen and pelvis 11/24/2011

CLINICAL DATA: Abdominal pain.  Nausea.

COMPLETE ABDOMINAL ULTRASOUND

[Series 1: us abdomen complete · 0.32mm/px · 14 of 183 slices shown]
[im 1/183]
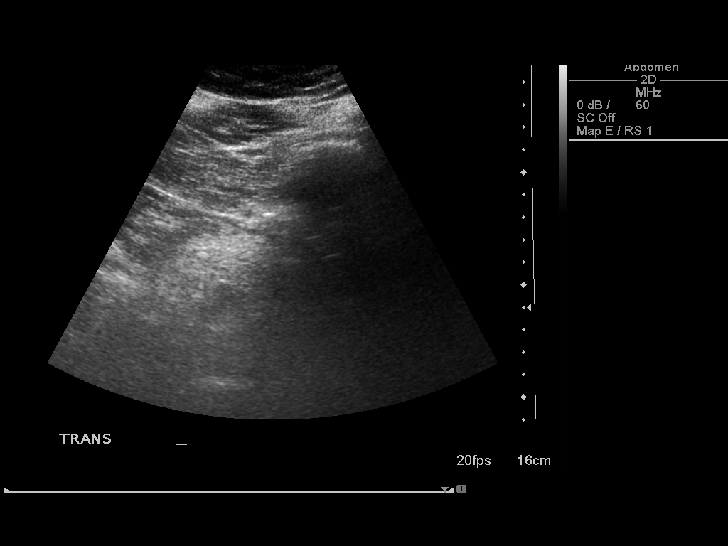
[im 16/183]
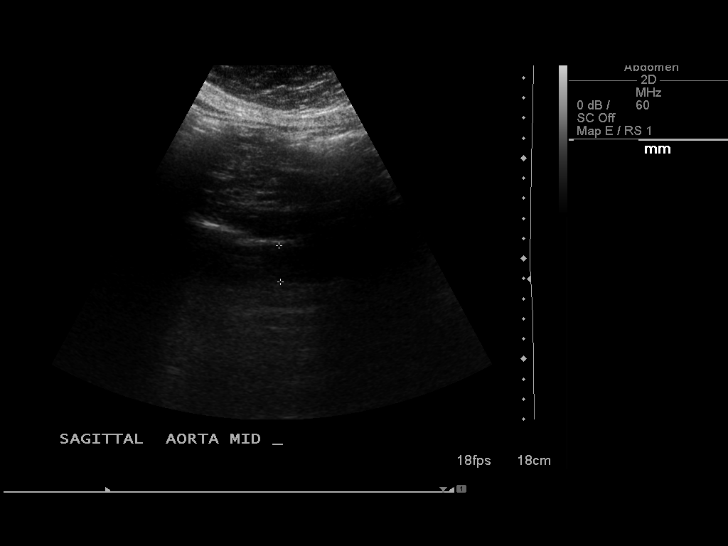
[im 31/183]
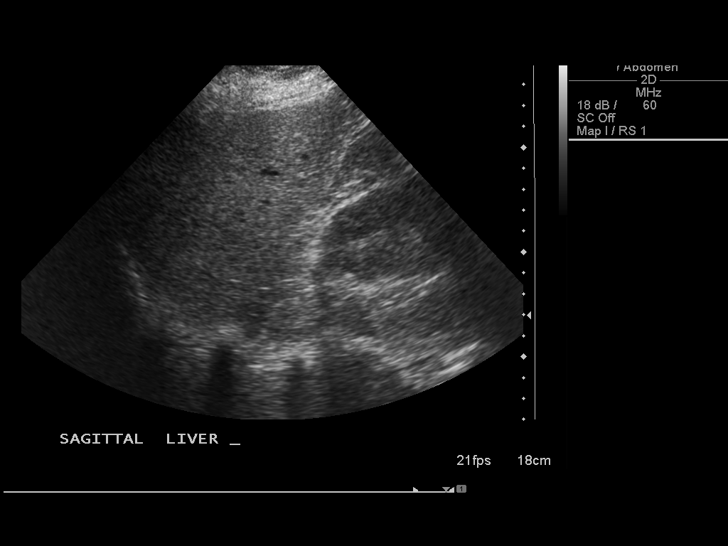
[im 46/183]
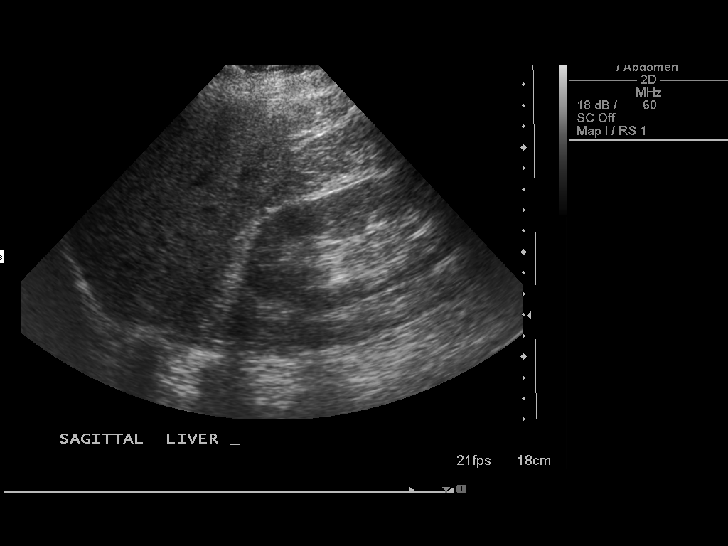
[im 61/183]
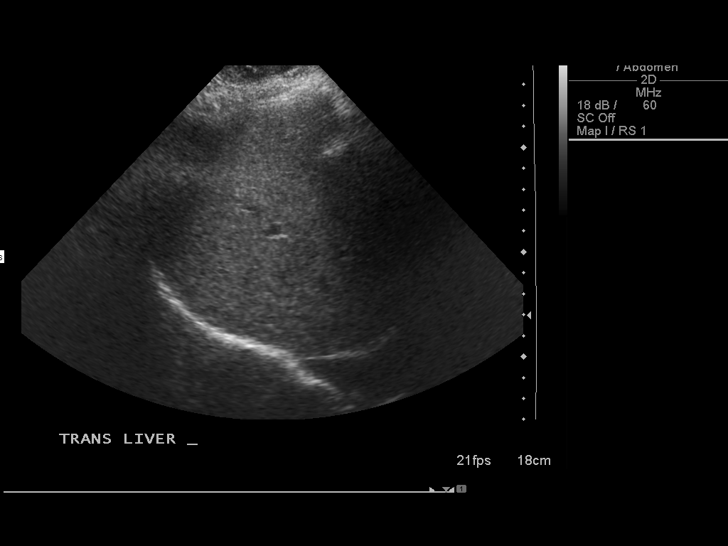
[im 69/183]
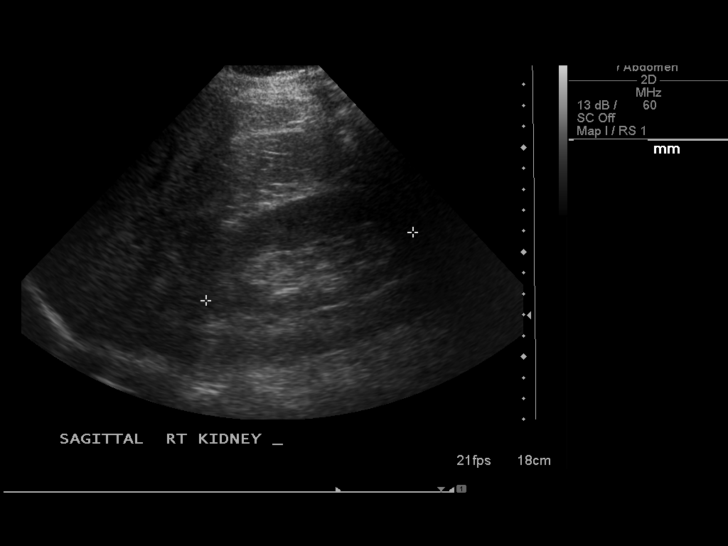
[im 84/183]
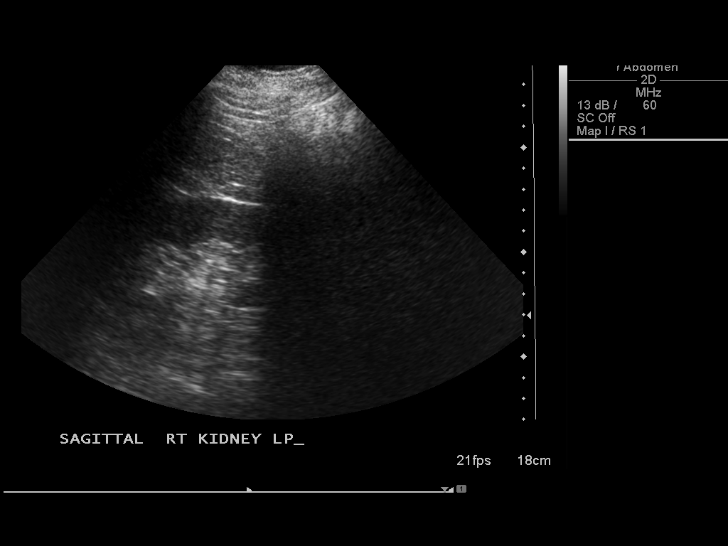
[im 99/183]
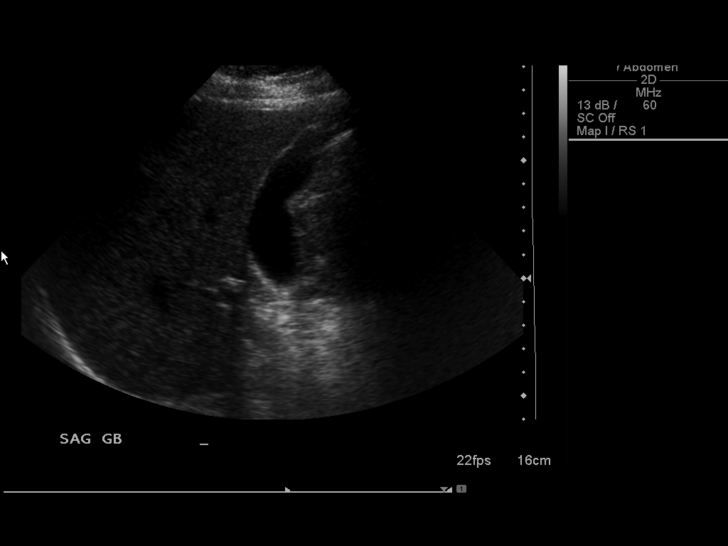
[im 114/183]
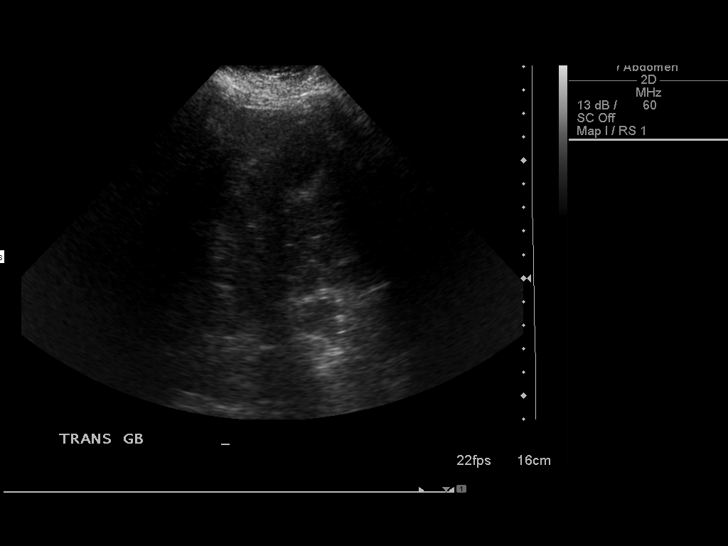
[im 122/183]
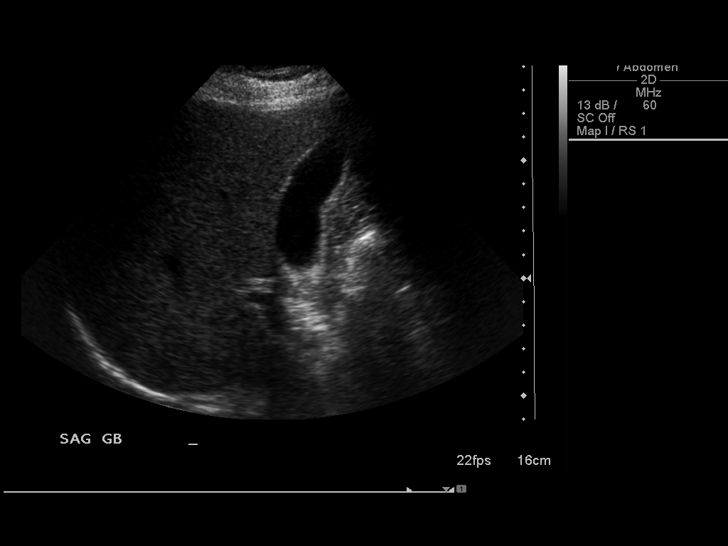
[im 137/183]
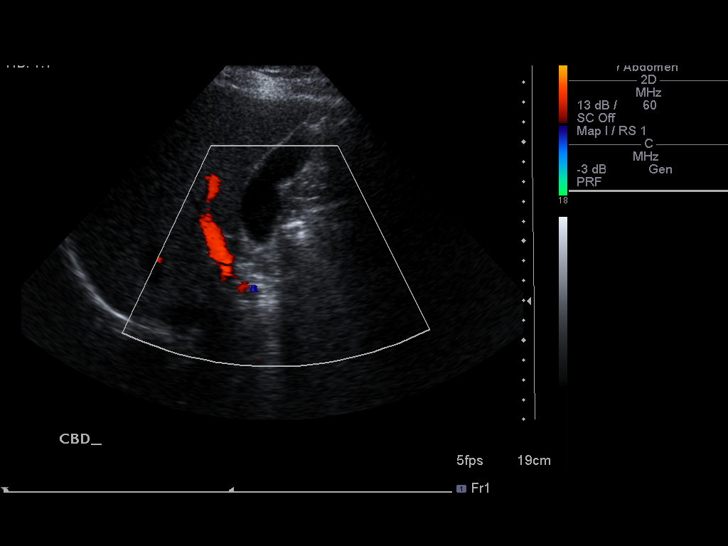
[im 152/183]
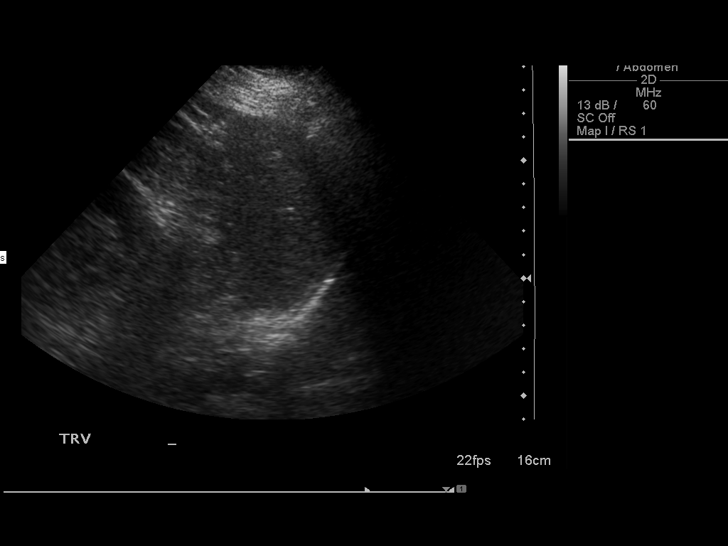
[im 167/183]
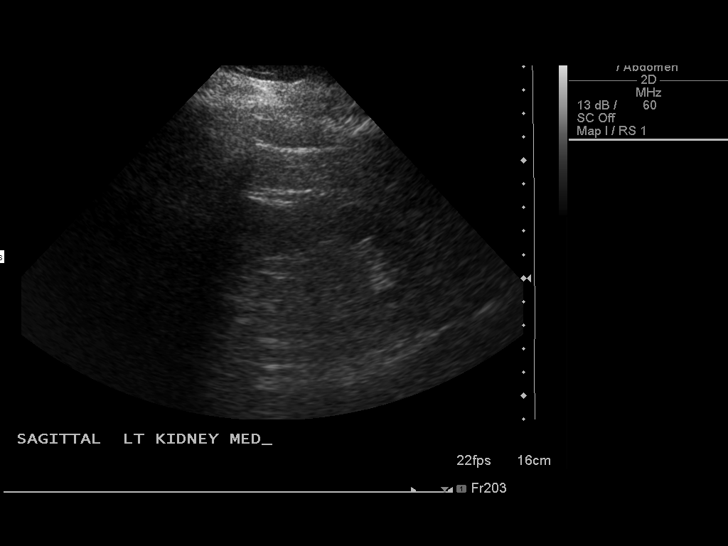
[im 183/183]
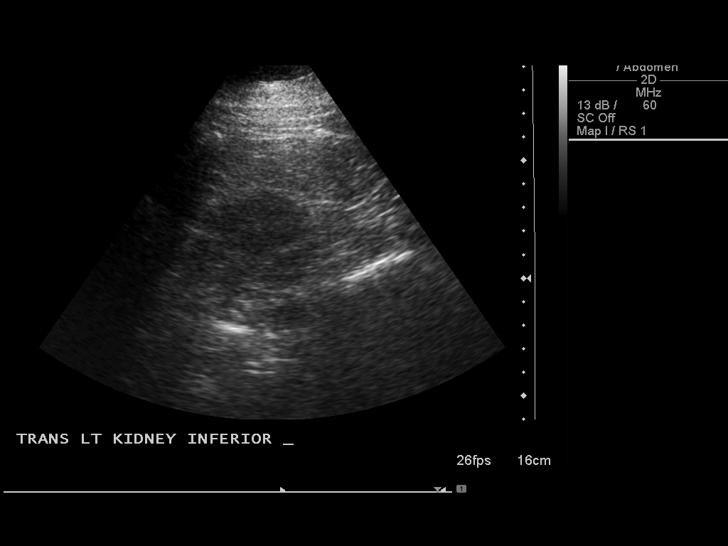

[14 of 25 positions shown; findings below may reference images not displayed]

FINDINGS: Gallbladder:  The gallbladder has a normal appearance.  Gallbladder
wall is 2.8 mm in thickness.  No stones or pericholecystic fluid
identified. During ultrasound exam, there is a positive sonographic
Murphy's sign.

Common bile duct:  4.3 mm, within normal limits.

Liver:  No focal lesion identified.  Within normal limits in
parenchymal echogenicity.

IVC:  Appears normal.

Pancreas:  The pancreas is not well seen because of overlying bowel
gas.

Spleen:  Normal in appearance, 5.6 cm.

Right Kidney:  Normal in appearance, 10.4 cm.

Left Kidney:  Normal in appearance, 11.3 cm.

Abdominal aorta:  Not aneurysmal.
IMPRESSION: 1.  No ultrasound imaging findings of gallbladder disease.
2.  Positive sonographic Murphy's sign raises the question of
acalculous cholecystitis.
3.  No hydronephrosis or evidence for other acute abnormality.

## 2013-10-11 MED ORDER — FLUTICASONE PROPIONATE 50 MCG/ACT NA SUSP: NASAL | Status: DC
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
## 2013-10-11 NOTE — ED Notes (Signed)
Reports feeling of throat closing in on her-states dealing with throat issues x one week.  Wants to see ENT for a throat swab.  Taking Cleocin currently for sinusitis.

## 2013-10-11 NOTE — Discharge Instructions (Signed)
Finish your antibiotic, Re check with your ENT. Flonase Rx.  Upper Respiratory Infection, Adult An upper respiratory infection (URI) is also sometimes known as the common cold. The upper respiratory tract includes the nose, sinuses, throat, trachea, and bronchi. Bronchi are the airways leading to the lungs. Most people improve within 1 week, but symptoms can last up to 2 weeks. A residual cough may last even longer.  CAUSES Many different viruses can infect the tissues lining the upper respiratory tract. The tissues become irritated and inflamed and often become very moist. Mucus production is also common. A cold is contagious. You can easily spread the virus to others by oral contact. This includes kissing, sharing a glass, coughing, or sneezing. Touching your mouth or nose and then touching a surface, which is then touched by another person, can also spread the virus. SYMPTOMS  Symptoms typically develop 1 to 3 days after you come in contact with a cold virus. Symptoms vary from person to person. They may include:  Runny nose.  Sneezing.  Nasal congestion.  Sinus irritation.  Sore throat.  Loss of voice (laryngitis).  Cough.  Fatigue.  Muscle aches.  Loss of appetite.  Headache.  Low-grade fever. DIAGNOSIS  You might diagnose your own cold based on familiar symptoms, since most people get a cold 2 to 3 times a year. Your caregiver can confirm this based on your exam. Most importantly, your caregiver can check that your symptoms are not due to another disease such as strep throat, sinusitis, pneumonia, asthma, or epiglottitis. Blood tests, throat tests, and X-rays are not necessary to diagnose a common cold, but they may sometimes be helpful in excluding other more serious diseases. Your caregiver will decide if any further tests are required. RISKS AND COMPLICATIONS  You may be at risk for a more severe case of the common cold if you smoke cigarettes, have chronic heart disease  (such as heart failure) or lung disease (such as asthma), or if you have a weakened immune system. The very young and very old are also at risk for more serious infections. Bacterial sinusitis, middle ear infections, and bacterial pneumonia can complicate the common cold. The common cold can worsen asthma and chronic obstructive pulmonary disease (COPD). Sometimes, these complications can require emergency medical care and may be life-threatening. PREVENTION  The best way to protect against getting a cold is to practice good hygiene. Avoid oral or hand contact with people with cold symptoms. Wash your hands often if contact occurs. There is no clear evidence that vitamin C, vitamin E, echinacea, or exercise reduces the chance of developing a cold. However, it is always recommended to get plenty of rest and practice good nutrition. TREATMENT  Treatment is directed at relieving symptoms. There is no cure. Antibiotics are not effective, because the infection is caused by a virus, not by bacteria. Treatment may include:  Increased fluid intake. Sports drinks offer valuable electrolytes, sugars, and fluids.  Breathing heated mist or steam (vaporizer or shower).  Eating chicken soup or other clear broths, and maintaining good nutrition.  Getting plenty of rest.  Using gargles or lozenges for comfort.  Controlling fevers with ibuprofen or acetaminophen as directed by your caregiver.  Increasing usage of your inhaler if you have asthma. Zinc gel and zinc lozenges, taken in the first 24 hours of the common cold, can shorten the duration and lessen the severity of symptoms. Pain medicines may help with fever, muscle aches, and throat pain. A variety of non-prescription  medicines are available to treat congestion and runny nose. Your caregiver can make recommendations and may suggest nasal or lung inhalers for other symptoms.  HOME CARE INSTRUCTIONS   Only take over-the-counter or prescription medicines  for pain, discomfort, or fever as directed by your caregiver.  Use a warm mist humidifier or inhale steam from a shower to increase air moisture. This may keep secretions moist and make it easier to breathe.  Drink enough water and fluids to keep your urine clear or pale yellow.  Rest as needed.  Return to work when your temperature has returned to normal or as your caregiver advises. You may need to stay home longer to avoid infecting others. You can also use a face mask and careful hand washing to prevent spread of the virus. SEEK MEDICAL CARE IF:   After the first few days, you feel you are getting worse rather than better.  You need your caregiver's advice about medicines to control symptoms.  You develop chills, worsening shortness of breath, or brown or red sputum. These may be signs of pneumonia.  You develop yellow or brown nasal discharge or pain in the face, especially when you bend forward. These may be signs of sinusitis.  You develop a fever, swollen neck glands, pain with swallowing, or white areas in the back of your throat. These may be signs of strep throat. SEEK IMMEDIATE MEDICAL CARE IF:   You have a fever.  You develop severe or persistent headache, ear pain, sinus pain, or chest pain.  You develop wheezing, a prolonged cough, cough up blood, or have a change in your usual mucus (if you have chronic lung disease).  You develop sore muscles or a stiff neck. Document Released: 01/24/2001 Document Revised: 10/23/2011 Document Reviewed: 12/02/2010 [facility] Patient Information 2014 [place], [place].

## 2013-10-13 DIAGNOSIS — R1013 Epigastric pain: Secondary | ICD-10-CM | POA: Diagnosis not present

## 2013-10-14 DIAGNOSIS — F339 Major depressive disorder, recurrent, unspecified: Secondary | ICD-10-CM | POA: Diagnosis not present

## 2013-10-15 ENCOUNTER — Encounter: Payer: Self-pay | Admitting: Family Medicine

## 2013-10-15 ENCOUNTER — Ambulatory Visit (INDEPENDENT_AMBULATORY_CARE_PROVIDER_SITE_OTHER): Payer: Medicare Other | Admitting: Family Medicine

## 2013-10-15 VITALS — BP 151/94 | HR 60 | Temp 98.2°F | Wt 180.0 lb

## 2013-10-15 DIAGNOSIS — R079 Chest pain, unspecified: Secondary | ICD-10-CM | POA: Diagnosis not present

## 2013-10-15 DIAGNOSIS — A499 Bacterial infection, unspecified: Secondary | ICD-10-CM

## 2013-10-15 DIAGNOSIS — I10 Essential (primary) hypertension: Secondary | ICD-10-CM | POA: Diagnosis not present

## 2013-10-15 DIAGNOSIS — Z888 Allergy status to other drugs, medicaments and biological substances status: Secondary | ICD-10-CM | POA: Diagnosis not present

## 2013-10-15 DIAGNOSIS — B9689 Other specified bacterial agents as the cause of diseases classified elsewhere: Secondary | ICD-10-CM | POA: Diagnosis not present

## 2013-10-15 DIAGNOSIS — R0789 Other chest pain: Secondary | ICD-10-CM | POA: Diagnosis not present

## 2013-10-15 DIAGNOSIS — R059 Cough, unspecified: Secondary | ICD-10-CM | POA: Diagnosis not present

## 2013-10-15 DIAGNOSIS — J329 Chronic sinusitis, unspecified: Secondary | ICD-10-CM

## 2013-10-15 DIAGNOSIS — Z79899 Other long term (current) drug therapy: Secondary | ICD-10-CM | POA: Diagnosis not present

## 2013-10-15 DIAGNOSIS — R05 Cough: Secondary | ICD-10-CM | POA: Diagnosis not present

## 2013-10-15 DIAGNOSIS — Z8673 Personal history of transient ischemic attack (TIA), and cerebral infarction without residual deficits: Secondary | ICD-10-CM | POA: Diagnosis not present

## 2013-10-15 MED ORDER — CEFDINIR 300 MG PO CAPS: 300.0000 mg | ORAL_CAPSULE | Freq: Two times a day (BID) | ORAL | Status: DC
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
## 2013-10-16 DIAGNOSIS — R197 Diarrhea, unspecified: Secondary | ICD-10-CM | POA: Diagnosis not present

## 2013-10-16 DIAGNOSIS — Z885 Allergy status to narcotic agent status: Secondary | ICD-10-CM | POA: Diagnosis not present

## 2013-10-16 DIAGNOSIS — R079 Chest pain, unspecified: Secondary | ICD-10-CM | POA: Diagnosis not present

## 2013-10-16 DIAGNOSIS — Z888 Allergy status to other drugs, medicaments and biological substances status: Secondary | ICD-10-CM | POA: Diagnosis not present

## 2013-10-16 DIAGNOSIS — K2270 Barrett's esophagus without dysplasia: Secondary | ICD-10-CM | POA: Diagnosis not present

## 2013-10-16 DIAGNOSIS — R0789 Other chest pain: Secondary | ICD-10-CM | POA: Diagnosis not present

## 2013-10-16 DIAGNOSIS — Z883 Allergy status to other anti-infective agents status: Secondary | ICD-10-CM | POA: Diagnosis not present

## 2013-10-16 DIAGNOSIS — G43909 Migraine, unspecified, not intractable, without status migrainosus: Secondary | ICD-10-CM | POA: Diagnosis not present

## 2013-10-16 DIAGNOSIS — Z886 Allergy status to analgesic agent status: Secondary | ICD-10-CM | POA: Diagnosis not present

## 2013-10-16 DIAGNOSIS — Z881 Allergy status to other antibiotic agents status: Secondary | ICD-10-CM | POA: Diagnosis not present

## 2013-10-16 DIAGNOSIS — R1084 Generalized abdominal pain: Secondary | ICD-10-CM | POA: Diagnosis not present

## 2013-10-16 DIAGNOSIS — G40909 Epilepsy, unspecified, not intractable, without status epilepticus: Secondary | ICD-10-CM | POA: Diagnosis not present

## 2013-10-16 DIAGNOSIS — D1809 Hemangioma of other sites: Secondary | ICD-10-CM | POA: Diagnosis not present

## 2013-10-16 DIAGNOSIS — R059 Cough, unspecified: Secondary | ICD-10-CM | POA: Diagnosis not present

## 2013-10-16 DIAGNOSIS — K219 Gastro-esophageal reflux disease without esophagitis: Secondary | ICD-10-CM | POA: Diagnosis not present

## 2013-10-16 DIAGNOSIS — E269 Hyperaldosteronism, unspecified: Secondary | ICD-10-CM | POA: Diagnosis not present

## 2013-10-16 DIAGNOSIS — J45909 Unspecified asthma, uncomplicated: Secondary | ICD-10-CM | POA: Diagnosis not present

## 2013-10-16 DIAGNOSIS — R109 Unspecified abdominal pain: Secondary | ICD-10-CM | POA: Diagnosis not present

## 2013-10-16 DIAGNOSIS — I10 Essential (primary) hypertension: Secondary | ICD-10-CM | POA: Diagnosis not present

## 2013-10-16 DIAGNOSIS — R110 Nausea: Secondary | ICD-10-CM | POA: Diagnosis not present

## 2013-10-16 DIAGNOSIS — I471 Supraventricular tachycardia: Secondary | ICD-10-CM | POA: Diagnosis not present

## 2013-10-16 DIAGNOSIS — Z880 Allergy status to penicillin: Secondary | ICD-10-CM | POA: Diagnosis not present

## 2013-10-16 DIAGNOSIS — F411 Generalized anxiety disorder: Secondary | ICD-10-CM | POA: Diagnosis not present

## 2013-10-16 DIAGNOSIS — E785 Hyperlipidemia, unspecified: Secondary | ICD-10-CM | POA: Diagnosis not present

## 2013-10-16 DIAGNOSIS — D1803 Hemangioma of intra-abdominal structures: Secondary | ICD-10-CM | POA: Diagnosis not present

## 2013-10-16 DIAGNOSIS — Z882 Allergy status to sulfonamides status: Secondary | ICD-10-CM | POA: Diagnosis not present

## 2013-10-16 DIAGNOSIS — Z79899 Other long term (current) drug therapy: Secondary | ICD-10-CM | POA: Diagnosis not present

## 2013-10-16 DIAGNOSIS — D509 Iron deficiency anemia, unspecified: Secondary | ICD-10-CM | POA: Diagnosis not present

## 2013-10-16 DIAGNOSIS — R05 Cough: Secondary | ICD-10-CM | POA: Diagnosis not present

## 2013-10-17 ENCOUNTER — Encounter (HOSPITAL_BASED_OUTPATIENT_CLINIC_OR_DEPARTMENT_OTHER): Payer: Self-pay | Admitting: Emergency Medicine

## 2013-10-17 ENCOUNTER — Emergency Department (HOSPITAL_BASED_OUTPATIENT_CLINIC_OR_DEPARTMENT_OTHER)
Admission: EM | Admit: 2013-10-17 | Discharge: 2013-10-17 | Disposition: A | Discharge status: Home / Self Care | Payer: Medicare Other | Attending: Emergency Medicine | Admitting: Emergency Medicine

## 2013-10-17 DIAGNOSIS — Z8541 Personal history of malignant neoplasm of cervix uteri: Secondary | ICD-10-CM | POA: Insufficient documentation

## 2013-10-17 DIAGNOSIS — K219 Gastro-esophageal reflux disease without esophagitis: Secondary | ICD-10-CM | POA: Insufficient documentation

## 2013-10-17 DIAGNOSIS — G8929 Other chronic pain: Secondary | ICD-10-CM | POA: Insufficient documentation

## 2013-10-17 DIAGNOSIS — Z9981 Dependence on supplemental oxygen: Secondary | ICD-10-CM | POA: Insufficient documentation

## 2013-10-17 DIAGNOSIS — R05 Cough: Secondary | ICD-10-CM | POA: Insufficient documentation

## 2013-10-17 DIAGNOSIS — R1110 Vomiting, unspecified: Secondary | ICD-10-CM | POA: Insufficient documentation

## 2013-10-17 DIAGNOSIS — J3489 Other specified disorders of nose and nasal sinuses: Secondary | ICD-10-CM | POA: Diagnosis not present

## 2013-10-17 DIAGNOSIS — G4730 Sleep apnea, unspecified: Secondary | ICD-10-CM | POA: Insufficient documentation

## 2013-10-17 DIAGNOSIS — R0981 Nasal congestion: Secondary | ICD-10-CM

## 2013-10-17 DIAGNOSIS — Z792 Long term (current) use of antibiotics: Secondary | ICD-10-CM | POA: Diagnosis not present

## 2013-10-17 DIAGNOSIS — I10 Essential (primary) hypertension: Secondary | ICD-10-CM | POA: Insufficient documentation

## 2013-10-17 DIAGNOSIS — Z862 Personal history of diseases of the blood and blood-forming organs and certain disorders involving the immune mechanism: Secondary | ICD-10-CM | POA: Diagnosis not present

## 2013-10-17 DIAGNOSIS — Z79899 Other long term (current) drug therapy: Secondary | ICD-10-CM | POA: Diagnosis not present

## 2013-10-17 DIAGNOSIS — J45909 Unspecified asthma, uncomplicated: Secondary | ICD-10-CM | POA: Insufficient documentation

## 2013-10-17 DIAGNOSIS — Z8639 Personal history of other endocrine, nutritional and metabolic disease: Secondary | ICD-10-CM | POA: Diagnosis not present

## 2013-10-17 DIAGNOSIS — R059 Cough, unspecified: Secondary | ICD-10-CM | POA: Diagnosis not present

## 2013-10-17 DIAGNOSIS — Z85828 Personal history of other malignant neoplasm of skin: Secondary | ICD-10-CM | POA: Insufficient documentation

## 2013-10-17 DIAGNOSIS — R5383 Other fatigue: Secondary | ICD-10-CM | POA: Diagnosis not present

## 2013-10-17 DIAGNOSIS — Z8614 Personal history of Methicillin resistant Staphylococcus aureus infection: Secondary | ICD-10-CM | POA: Insufficient documentation

## 2013-10-17 DIAGNOSIS — Z880 Allergy status to penicillin: Secondary | ICD-10-CM | POA: Diagnosis not present

## 2013-10-17 DIAGNOSIS — Z87891 Personal history of nicotine dependence: Secondary | ICD-10-CM | POA: Insufficient documentation

## 2013-10-17 DIAGNOSIS — IMO0002 Reserved for concepts with insufficient information to code with codable children: Secondary | ICD-10-CM | POA: Diagnosis not present

## 2013-10-17 DIAGNOSIS — R5381 Other malaise: Secondary | ICD-10-CM | POA: Diagnosis not present

## 2013-10-17 DIAGNOSIS — Z8739 Personal history of other diseases of the musculoskeletal system and connective tissue: Secondary | ICD-10-CM | POA: Diagnosis not present
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
## 2013-10-17 NOTE — ED Notes (Signed)
Pt reports she noticed her tongue was swelling and white this am.  Pt reports neck feelings "puffy."  Pt followed up with ENT and regular PCP, but was unable to get pt in for appt.

## 2013-10-17 NOTE — ED Notes (Signed)
MD Wickline at bedside for evaluation.

## 2013-10-18 DIAGNOSIS — R05 Cough: Secondary | ICD-10-CM | POA: Diagnosis not present

## 2013-10-18 DIAGNOSIS — R0609 Other forms of dyspnea: Secondary | ICD-10-CM | POA: Diagnosis not present

## 2013-10-18 DIAGNOSIS — R070 Pain in throat: Secondary | ICD-10-CM | POA: Diagnosis not present

## 2013-10-18 DIAGNOSIS — R5383 Other fatigue: Secondary | ICD-10-CM | POA: Diagnosis not present

## 2013-10-18 DIAGNOSIS — R5381 Other malaise: Secondary | ICD-10-CM | POA: Diagnosis not present

## 2013-10-18 DIAGNOSIS — R059 Cough, unspecified: Secondary | ICD-10-CM | POA: Diagnosis not present

## 2013-10-18 DIAGNOSIS — I10 Essential (primary) hypertension: Secondary | ICD-10-CM | POA: Diagnosis not present

## 2013-10-18 DIAGNOSIS — R0989 Other specified symptoms and signs involving the circulatory and respiratory systems: Secondary | ICD-10-CM | POA: Diagnosis not present

## 2013-10-18 DIAGNOSIS — J3489 Other specified disorders of nose and nasal sinuses: Secondary | ICD-10-CM | POA: Diagnosis not present

## 2013-10-18 DIAGNOSIS — R000 Tachycardia, unspecified: Secondary | ICD-10-CM | POA: Diagnosis not present

## 2013-10-18 DIAGNOSIS — R0602 Shortness of breath: Secondary | ICD-10-CM | POA: Diagnosis not present

## 2013-10-18 DIAGNOSIS — E669 Obesity, unspecified: Secondary | ICD-10-CM | POA: Diagnosis not present

## 2013-10-20 ENCOUNTER — Encounter: Payer: Self-pay | Admitting: Gynecologic Oncology

## 2013-10-20 ENCOUNTER — Telehealth: Payer: Self-pay | Admitting: Gynecologic Oncology

## 2013-10-20 ENCOUNTER — Ambulatory Visit: Payer: Medicare Other

## 2013-10-20 ENCOUNTER — Ambulatory Visit: Payer: Medicare Other | Attending: Gynecologic Oncology | Admitting: Gynecologic Oncology

## 2013-10-20 VITALS — BP 136/92 | HR 116 | Temp 98.3°F | Resp 20 | Ht 62.0 in | Wt 180.9 lb

## 2013-10-20 DIAGNOSIS — N939 Abnormal uterine and vaginal bleeding, unspecified: Secondary | ICD-10-CM

## 2013-10-20 DIAGNOSIS — R300 Dysuria: Secondary | ICD-10-CM

## 2013-10-20 DIAGNOSIS — M549 Dorsalgia, unspecified: Secondary | ICD-10-CM

## 2013-10-20 LAB — URINALYSIS, MICROSCOPIC - CHCC
Bilirubin (Urine): NEGATIVE
Blood: NEGATIVE
Glucose: NEGATIVE mg/dL
Ketones: NEGATIVE mg/dL
Nitrite: NEGATIVE
Protein: NEGATIVE mg/dL
RBC / HPF: NEGATIVE (ref 0–2)
Specific Gravity, Urine: 1.01 (ref 1.003–1.035)
Urobilinogen, UR: 0.2 mg/dL (ref 0.2–1)
pH: 7 (ref 4.6–8.0)

## 2013-10-20 IMAGING — CR DG NECK SOFT TISSUE
1 series · 1 of 1 positions shown · non-contrast
Comparison: CT maxillofacial 03/20/2012.  The

CLINICAL DATA: Throat tightness.  Bronchitis.  Sick for 2 weeks.

NECK SOFT TISSUES - 1+ VIEW

[w soft tissue neck]
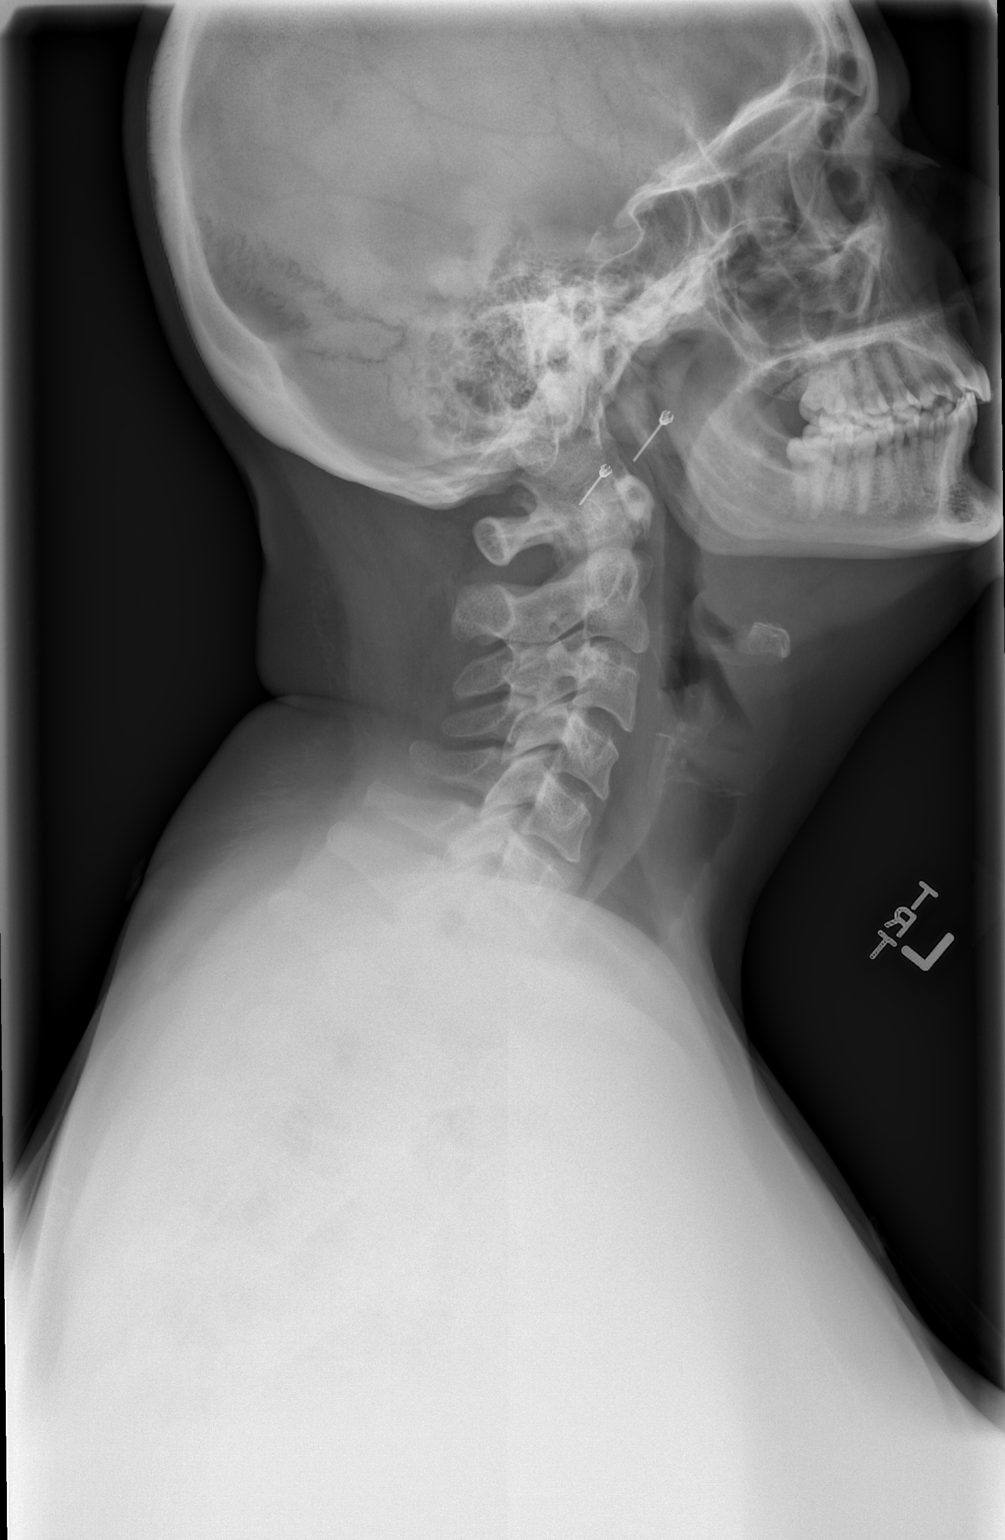

[1 of 1 positions shown; findings below may reference images not displayed]

FINDINGS: Metallic foreign bodies projected over the upper cervical
region consistent with jewelry.  No radiopaque foreign bodies are
demonstrated.  No prevertebral or submental soft tissue swelling.
Epiglottis and aryepiglottic folds are not thickened.  Cervical
airway appears patent in the lateral projection.  Normal alignment
of the cervical spine.  Small bowel gas demonstrated in the
esophagus.  No extraluminal soft tissue gas identified.
IMPRESSION: No evidence of significant soft tissue swelling or radiopaque
foreign body in the neck.
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
## 2013-10-20 NOTE — Telephone Encounter (Signed)
Patient called with complaints of bright red vaginal bleeding.  Minimal amount of blood reported and noted when she sticks a towel partially into her vagina.  She continues to report back pain and right abdominal pain.  She had a BM this am and is passing flatus.  Hx of an appendectomy per pt.  She states that she is sure the blood is not coming from her bladder or rectum but has dysuria at times.  She reports having intercourse a few days ago that was more painful than usual and using a vinegar douche this am.  "I just feel that something is not right."  Also reporting intermittent abdominal bloating.  Wanting to go to the emergency room for evaluation.  Advised that she could be seen in the office for evaluation and that ED evaluation could follow her appointment if necessary.  Stating she wants to go to the ED.  Advised that ultimately it is her decision but in my opinion, at this time, it would be best for her to come to the [facility] today for an appointment to evaluate her complaints of dysuria and vaginal bleeding.  Agreeable with the plan.  She is to arrive at 3:30pm to the office.  ED evaluation to follow if deemed necessary.  Patient ended the conversation by saying "I have a lot of other things going on too."

## 2013-10-20 NOTE — Patient Instructions (Signed)
We will contact you with the results of your urine culture.  Call the office if you notice any more vaginal bleeding.  Avoid douching.  Call the office for any questions or concerns.

## 2013-10-21 ENCOUNTER — Ambulatory Visit: Payer: Medicare Other | Admitting: Family Medicine

## 2013-10-21 DIAGNOSIS — N898 Other specified noninflammatory disorders of vagina: Secondary | ICD-10-CM | POA: Diagnosis not present

## 2013-10-21 DIAGNOSIS — IMO0002 Reserved for concepts with insufficient information to code with codable children: Secondary | ICD-10-CM | POA: Diagnosis not present

## 2013-10-21 DIAGNOSIS — J029 Acute pharyngitis, unspecified: Secondary | ICD-10-CM | POA: Diagnosis not present

## 2013-10-21 DIAGNOSIS — R109 Unspecified abdominal pain: Secondary | ICD-10-CM | POA: Diagnosis not present

## 2013-10-21 DIAGNOSIS — N938 Other specified abnormal uterine and vaginal bleeding: Secondary | ICD-10-CM | POA: Diagnosis not present

## 2013-10-21 DIAGNOSIS — X58XXXA Exposure to other specified factors, initial encounter: Secondary | ICD-10-CM | POA: Diagnosis not present

## 2013-10-21 DIAGNOSIS — R55 Syncope and collapse: Secondary | ICD-10-CM | POA: Diagnosis not present

## 2013-10-21 DIAGNOSIS — N925 Other specified irregular menstruation: Secondary | ICD-10-CM | POA: Diagnosis not present

## 2013-10-21 DIAGNOSIS — I10 Essential (primary) hypertension: Secondary | ICD-10-CM | POA: Diagnosis not present

## 2013-10-21 LAB — URINE CULTURE

## 2013-10-22 ENCOUNTER — Emergency Department (HOSPITAL_COMMUNITY): Payer: Medicare Other

## 2013-10-22 ENCOUNTER — Emergency Department (HOSPITAL_COMMUNITY)
Admission: EM | Admit: 2013-10-22 | Discharge: 2013-10-22 | Disposition: A | Discharge status: Home / Self Care | Payer: Medicare Other | Attending: Emergency Medicine | Admitting: Emergency Medicine

## 2013-10-22 ENCOUNTER — Encounter (HOSPITAL_COMMUNITY): Payer: Self-pay | Admitting: Emergency Medicine

## 2013-10-22 ENCOUNTER — Other Ambulatory Visit: Payer: Self-pay

## 2013-10-22 ENCOUNTER — Telehealth: Payer: Self-pay | Admitting: *Deleted

## 2013-10-22 DIAGNOSIS — J029 Acute pharyngitis, unspecified: Secondary | ICD-10-CM | POA: Insufficient documentation

## 2013-10-22 DIAGNOSIS — R61 Generalized hyperhidrosis: Secondary | ICD-10-CM | POA: Insufficient documentation

## 2013-10-22 DIAGNOSIS — R6883 Chills (without fever): Secondary | ICD-10-CM | POA: Insufficient documentation

## 2013-10-22 DIAGNOSIS — G4730 Sleep apnea, unspecified: Secondary | ICD-10-CM | POA: Diagnosis not present

## 2013-10-22 DIAGNOSIS — Z8639 Personal history of other endocrine, nutritional and metabolic disease: Secondary | ICD-10-CM | POA: Diagnosis not present

## 2013-10-22 DIAGNOSIS — G40909 Epilepsy, unspecified, not intractable, without status epilepticus: Secondary | ICD-10-CM | POA: Insufficient documentation

## 2013-10-22 DIAGNOSIS — Z8614 Personal history of Methicillin resistant Staphylococcus aureus infection: Secondary | ICD-10-CM | POA: Insufficient documentation

## 2013-10-22 DIAGNOSIS — N898 Other specified noninflammatory disorders of vagina: Secondary | ICD-10-CM | POA: Diagnosis not present

## 2013-10-22 DIAGNOSIS — G8928 Other chronic postprocedural pain: Secondary | ICD-10-CM | POA: Insufficient documentation

## 2013-10-22 DIAGNOSIS — Z9851 Tubal ligation status: Secondary | ICD-10-CM | POA: Diagnosis not present

## 2013-10-22 DIAGNOSIS — K219 Gastro-esophageal reflux disease without esophagitis: Secondary | ICD-10-CM | POA: Insufficient documentation

## 2013-10-22 DIAGNOSIS — Z87891 Personal history of nicotine dependence: Secondary | ICD-10-CM | POA: Diagnosis not present

## 2013-10-22 DIAGNOSIS — Z9089 Acquired absence of other organs: Secondary | ICD-10-CM | POA: Insufficient documentation

## 2013-10-22 DIAGNOSIS — R079 Chest pain, unspecified: Secondary | ICD-10-CM | POA: Diagnosis not present

## 2013-10-22 DIAGNOSIS — Z862 Personal history of diseases of the blood and blood-forming organs and certain disorders involving the immune mechanism: Secondary | ICD-10-CM | POA: Diagnosis not present

## 2013-10-22 DIAGNOSIS — Z880 Allergy status to penicillin: Secondary | ICD-10-CM | POA: Diagnosis not present

## 2013-10-22 DIAGNOSIS — R498 Other voice and resonance disorders: Secondary | ICD-10-CM | POA: Insufficient documentation

## 2013-10-22 DIAGNOSIS — R51 Headache: Secondary | ICD-10-CM | POA: Insufficient documentation

## 2013-10-22 DIAGNOSIS — R109 Unspecified abdominal pain: Secondary | ICD-10-CM | POA: Diagnosis not present

## 2013-10-22 DIAGNOSIS — F411 Generalized anxiety disorder: Secondary | ICD-10-CM | POA: Insufficient documentation

## 2013-10-22 DIAGNOSIS — J45901 Unspecified asthma with (acute) exacerbation: Secondary | ICD-10-CM | POA: Insufficient documentation

## 2013-10-22 DIAGNOSIS — I10 Essential (primary) hypertension: Secondary | ICD-10-CM | POA: Diagnosis not present

## 2013-10-22 DIAGNOSIS — Z9079 Acquired absence of other genital organ(s): Secondary | ICD-10-CM | POA: Diagnosis not present

## 2013-10-22 DIAGNOSIS — Z79899 Other long term (current) drug therapy: Secondary | ICD-10-CM | POA: Insufficient documentation

## 2013-10-22 DIAGNOSIS — Z8542 Personal history of malignant neoplasm of other parts of uterus: Secondary | ICD-10-CM | POA: Diagnosis not present

## 2013-10-22 DIAGNOSIS — G8929 Other chronic pain: Secondary | ICD-10-CM | POA: Diagnosis not present

## 2013-10-22 DIAGNOSIS — R0600 Dyspnea, unspecified: Secondary | ICD-10-CM

## 2013-10-22 DIAGNOSIS — Z9889 Other specified postprocedural states: Secondary | ICD-10-CM | POA: Insufficient documentation

## 2013-10-22 DIAGNOSIS — IMO0002 Reserved for concepts with insufficient information to code with codable children: Secondary | ICD-10-CM | POA: Insufficient documentation

## 2013-10-22 DIAGNOSIS — Z791 Long term (current) use of non-steroidal anti-inflammatories (NSAID): Secondary | ICD-10-CM | POA: Insufficient documentation

## 2013-10-22 DIAGNOSIS — R55 Syncope and collapse: Secondary | ICD-10-CM | POA: Diagnosis not present

## 2013-10-22 DIAGNOSIS — Z9981 Dependence on supplemental oxygen: Secondary | ICD-10-CM | POA: Insufficient documentation

## 2013-10-22 LAB — I-STAT CHEM 8, ED
BUN: 16 mg/dL (ref 6–23)
Calcium, Ion: 1.15 mmol/L (ref 1.12–1.23)
Chloride: 105 mEq/L (ref 96–112)
Creatinine, Ser: 0.6 mg/dL (ref 0.50–1.10)
Glucose, Bld: 103 mg/dL — ABNORMAL HIGH (ref 70–99)
HCT: 45 % (ref 36.0–46.0)
Hemoglobin: 15.3 g/dL — ABNORMAL HIGH (ref 12.0–15.0)
Potassium: 3.7 mEq/L (ref 3.7–5.3)
Sodium: 145 mEq/L (ref 137–147)
TCO2: 24 mmol/L (ref 0–100)

## 2013-10-22 LAB — CBC WITH DIFFERENTIAL/PLATELET
Basophils Absolute: 0.1 10*3/uL (ref 0.0–0.1)
Basophils Relative: 1 % (ref 0–1)
Eosinophils Absolute: 0.2 10*3/uL (ref 0.0–0.7)
Eosinophils Relative: 3 % (ref 0–5)
HCT: 41.8 % (ref 36.0–46.0)
Hemoglobin: 13.9 g/dL (ref 12.0–15.0)
Lymphocytes Relative: 23 % (ref 12–46)
Lymphs Abs: 1.3 10*3/uL (ref 0.7–4.0)
MCH: 29.1 pg (ref 26.0–34.0)
MCHC: 33.3 g/dL (ref 30.0–36.0)
MCV: 87.4 fL (ref 78.0–100.0)
Monocytes Absolute: 0.3 10*3/uL (ref 0.1–1.0)
Monocytes Relative: 6 % (ref 3–12)
Neutro Abs: 3.7 10*3/uL (ref 1.7–7.7)
Neutrophils Relative %: 67 % (ref 43–77)
Platelets: 237 10*3/uL (ref 150–400)
RBC: 4.78 MIL/uL (ref 3.87–5.11)
RDW: 14.4 % (ref 11.5–15.5)
WBC: 5.6 10*3/uL (ref 4.0–10.5)

## 2013-10-22 MED ORDER — DIAZEPAM 5 MG PO TABS: 5.0000 mg | ORAL_TABLET | Freq: Three times a day (TID) | ORAL | Status: DC | PRN

## 2013-10-22 MED ORDER — ONDANSETRON HCL 4 MG/2ML IJ SOLN
4.0000 mg | Freq: Once | INTRAMUSCULAR | Status: DC
  Filled 2013-10-22: qty 2

## 2013-10-22 MED ORDER — IOHEXOL 300 MG/ML  SOLN
100.0000 mL | Freq: Once | INTRAMUSCULAR | Status: AC | PRN
  Administered 2013-10-22: 100 mL via INTRAVENOUS

## 2013-10-22 MED ORDER — SODIUM CHLORIDE 0.9 % IV BOLUS (SEPSIS)
1000.0000 mL | Freq: Once | INTRAVENOUS | Status: AC
  Administered 2013-10-22: 1000 mL via INTRAVENOUS

## 2013-10-22 MED ORDER — MORPHINE SULFATE 4 MG/ML IJ SOLN
4.0000 mg | Freq: Once | INTRAMUSCULAR | Status: DC
  Filled 2013-10-22: qty 1

## 2013-10-22 NOTE — ED Notes (Addendum)
Entered room to review POC Patient refusing to put on hospital gown, because "I am allergic to the detergent that you guys use." Patient informed of order for PIV and labs Patient does not want me to start her PIV or obtain labs (wants IV team nurse), because "I am a very hard stick and I have not had any fluids. Two days ago they had to use the doppler and they still messed up my arms." Patient informed that this will cause a delay in care--still wants IV team nurse paged Patient able to have this conversation without difficulty speaking, SOB/DOE or difficulty swallowing  Dr. Marnette Burgess made aware

## 2013-10-22 NOTE — ED Provider Notes (Signed)
Medical screening examination/treatment/procedure(s) were performed by non-physician practitioner and as supervising physician I was immediately available for consultation/collaboration.   Teressa Lower, MD 10/22/13 [phone]

## 2013-10-22 NOTE — ED Notes (Signed)
MD at bedside.  EDPA Raquel Sarna present to speak with pt
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
## 2013-10-22 NOTE — Discharge Instructions (Signed)
Read the information below.  Use the prescribed medication as directed.  Please discuss all new medications with your pharmacist.  You may return to the Emergency Department at any time for worsening condition or any new symptoms that concern you.  If you develop high fevers, difficulty swallowing or breathing, or you are unable to tolerate fluids by mouth, return to the ER immediately for a recheck.      Sore Throat A sore throat is pain, burning, irritation, or scratchiness of the throat. There is often pain or tenderness when swallowing or talking. A sore throat may be accompanied by other symptoms, such as coughing, sneezing, fever, and swollen neck glands. A sore throat is often the first sign of another sickness, such as a cold, flu, strep throat, or mononucleosis (commonly known as mono). Most sore throats go away without medical treatment. CAUSES  The most common causes of a sore throat include:  A viral infection, such as a cold, flu, or mono.  A bacterial infection, such as strep throat, tonsillitis, or whooping cough.  Seasonal allergies.  Dryness in the air.  Irritants, such as smoke or pollution.  Gastroesophageal reflux disease (GERD). HOME CARE INSTRUCTIONS   Only take over-the-counter medicines as directed by your caregiver.  Drink enough fluids to keep your urine clear or pale yellow.  Rest as needed.  Try using throat sprays, lozenges, or sucking on hard candy to ease any pain (if older than 4 years or as directed).  Sip warm liquids, such as broth, herbal tea, or warm water with honey to relieve pain temporarily. You may also eat or drink cold or frozen liquids such as frozen ice pops.  Gargle with salt water (mix 1 tsp salt with 8 oz of water).  Do not smoke and avoid secondhand smoke.  Put a cool-mist humidifier in your bedroom at night to moisten the air. You can also turn on a hot shower and sit in the bathroom with the door closed for 5 10 minutes. SEEK  IMMEDIATE MEDICAL CARE IF:  You have difficulty breathing.  You are unable to swallow fluids, soft foods, or your saliva.  You have increased swelling in the throat.  Your sore throat does not get better in 7 days.  You have nausea and vomiting.  You have a fever or persistent symptoms for more than 2 3 days.  You have a fever and your symptoms suddenly get worse. MAKE SURE YOU:   Understand these instructions.  Will watch your condition.  Will get help right away if you are not doing well or get worse. Document Released: 09/07/2004 Document Revised: 07/17/2012 Document Reviewed: 04/07/2012 [facility] Patient Information 2014 [place], [place].  Shortness of Breath Shortness of breath means you have trouble breathing. Shortness of breath may indicate that you have a medical problem. You should seek immediate medical care for shortness of breath. CAUSES   Not enough oxygen in the air (as with high altitudes or a smoke-filled room).  Short-term (acute) lung disease, including:  Infections, such as pneumonia.  Fluid in the lungs, such as heart failure.  A blood clot in the lungs (pulmonary embolism).  Long-term (chronic) lung diseases.  Heart disease (heart attack, angina, heart failure, and others).  Low red blood cells (anemia).  Poor physical fitness. This can cause shortness of breath when you exercise.  Chest or back injuries or stiffness.  Being overweight.  Smoking.  Anxiety. This can make you feel like you are not getting enough air. DIAGNOSIS  Serious medical problems can usually be found during your physical exam. Tests may also be done to determine why you are having shortness of breath. Tests may include:  Chest X-rays.  Lung function tests.  Blood tests.  Electrocardiography.  Exercise testing.  Echocardiography.  Imaging scans. Your caregiver may not be able to find a cause for your shortness of breath after your exam. In this case, it is  important to have a follow-up exam with your caregiver as directed.  TREATMENT  Treatment for shortness of breath depends on the cause of your symptoms and can vary greatly. HOME CARE INSTRUCTIONS   Do not smoke. Smoking is a common cause of shortness of breath. If you smoke, ask for help to quit.  Avoid being around chemicals or things that may bother your breathing, such as paint fumes and dust.  Rest as needed. Slowly resume your usual activities.  If medicines were prescribed, take them as directed for the full length of time directed. This includes oxygen and any inhaled medicines.  Keep all follow-up appointments as directed by your caregiver. SEEK MEDICAL CARE IF:   Your condition does not improve in the time expected.  You have a hard time doing your normal activities even with rest.  You have any side effects or problems with the medicines prescribed.  You develop any new symptoms. SEEK IMMEDIATE MEDICAL CARE IF:   Your shortness of breath gets worse.  You feel lightheaded, faint, or develop a cough not controlled with medicines.  You start coughing up blood.  You have pain with breathing.  You have chest pain or pain in your arms, shoulders, or abdomen.  You have a fever.  You are unable to walk up stairs or exercise the way you normally do. MAKE SURE YOU:  Understand these instructions.  Will watch your condition.  Will get help right away if you are not doing well or get worse. Document Released: 04/25/2001 Document Revised: 01/30/2012 Document Reviewed: 10/16/2011 [facility] Patient Information 2014 [place].

## 2013-10-22 NOTE — ED Notes (Signed)
Pt has this time has declined pain medication stating she does not do narcotic medication. Also made aware of IV team notification for IV access as requested. Lab also aware of delay on labs. Pt has agreed to have labs drawn. Almyra Free NT as BS to attempt to draw labs.

## 2013-10-22 NOTE — Telephone Encounter (Signed)
Pt stated that she is having throat pain and has been to the ED and she stated that her lymph node on the left side was swollen. She would like to know if something can be sent to the pharmacy.Jennifer [name]

## 2013-10-22 NOTE — ED Notes (Signed)
Pt states that she is having a sore throat and continues to cough and is having trouble breathing. Pt states that she is was eating yesterday and felt as if food was getting hung in her throat. Pt talking in full complete sentences. Pt reports having cold symptoms previous to this. Pt alert and ambulatory.

## 2013-10-24 ENCOUNTER — Telehealth: Payer: Self-pay | Admitting: *Deleted

## 2013-10-24 DIAGNOSIS — R42 Dizziness and giddiness: Secondary | ICD-10-CM | POA: Diagnosis not present

## 2013-10-24 DIAGNOSIS — R5381 Other malaise: Secondary | ICD-10-CM | POA: Diagnosis not present

## 2013-10-24 DIAGNOSIS — R000 Tachycardia, unspecified: Secondary | ICD-10-CM | POA: Diagnosis not present

## 2013-10-24 DIAGNOSIS — R51 Headache: Secondary | ICD-10-CM | POA: Diagnosis not present

## 2013-10-24 DIAGNOSIS — I471 Supraventricular tachycardia: Secondary | ICD-10-CM | POA: Diagnosis not present

## 2013-10-24 DIAGNOSIS — I10 Essential (primary) hypertension: Secondary | ICD-10-CM | POA: Diagnosis not present

## 2013-10-24 DIAGNOSIS — R5383 Other fatigue: Secondary | ICD-10-CM | POA: Diagnosis not present

## 2013-10-24 MED ORDER — DOXYCYCLINE HYCLATE 50 MG PO CAPS: 50.0000 mg | ORAL_CAPSULE | Freq: Two times a day (BID) | ORAL | Status: DC

## 2013-10-24 NOTE — Telephone Encounter (Signed)
Message copied by GARNER, Aletha Halim on Fri Oct 24, 2013  3:52 PM ------      Message from: Joylene John D      Created: Fri Oct 24, 2013  3:28 PM       Please let the patient know she does not have a UTI.  Thanks!      ----- Message -----         From: [facility] Interface         Sent: 10/20/2013   4:10 PM           To: Dorothyann Gibbs, NP                   ------

## 2013-10-24 NOTE — Telephone Encounter (Signed)
She stated that Doxy in a lower may work however she has to have a lower dose due to stomach upset.Jennifer [name] [place]

## 2013-10-24 NOTE — Telephone Encounter (Signed)
Which antibiotic does she feels she can tolerate?

## 2013-10-24 NOTE — Telephone Encounter (Signed)
OK rx sent for low dose doxy

## 2013-10-24 NOTE — Telephone Encounter (Signed)
Called pt gave results : no UTI. Pt states "ok, but I'm still having pain with #2 in my abdomen.I started bleeding again the next day after I saw Melissa." Pt denied pain in rectum related to internal or external Hemmroids, pt voiced she did have intercourse prior to last appt and agreed pain/discomfort could be related to this. Pt also verbalized the bleeding was more of a spotting vaginally, after wiping when using the bathroom Recommended pt try not to have intercourse for a few weeks to allow pelvic area to heal, sitz baths, advil, warm compresses to areas of discomfort. Discussed with pt I would relay her concerns to NP, continue to monitor. Pt verbalized understanding. No further concerns.

## 2013-10-28 DIAGNOSIS — F339 Major depressive disorder, recurrent, unspecified: Secondary | ICD-10-CM | POA: Diagnosis not present

## 2013-10-29 DIAGNOSIS — J358 Other chronic diseases of tonsils and adenoids: Secondary | ICD-10-CM | POA: Diagnosis not present

## 2013-10-29 DIAGNOSIS — J387 Other diseases of larynx: Secondary | ICD-10-CM | POA: Diagnosis not present

## 2013-10-29 DIAGNOSIS — Z8709 Personal history of other diseases of the respiratory system: Secondary | ICD-10-CM | POA: Diagnosis not present

## 2013-10-29 IMAGING — CR DG CHEST 2V
2 series · 2 of 2 positions shown · non-contrast
Comparison: 07/16/2012

CLINICAL DATA: Cough and congestion.

CHEST - 2 VIEW

[view not recorded (1 of 2)]
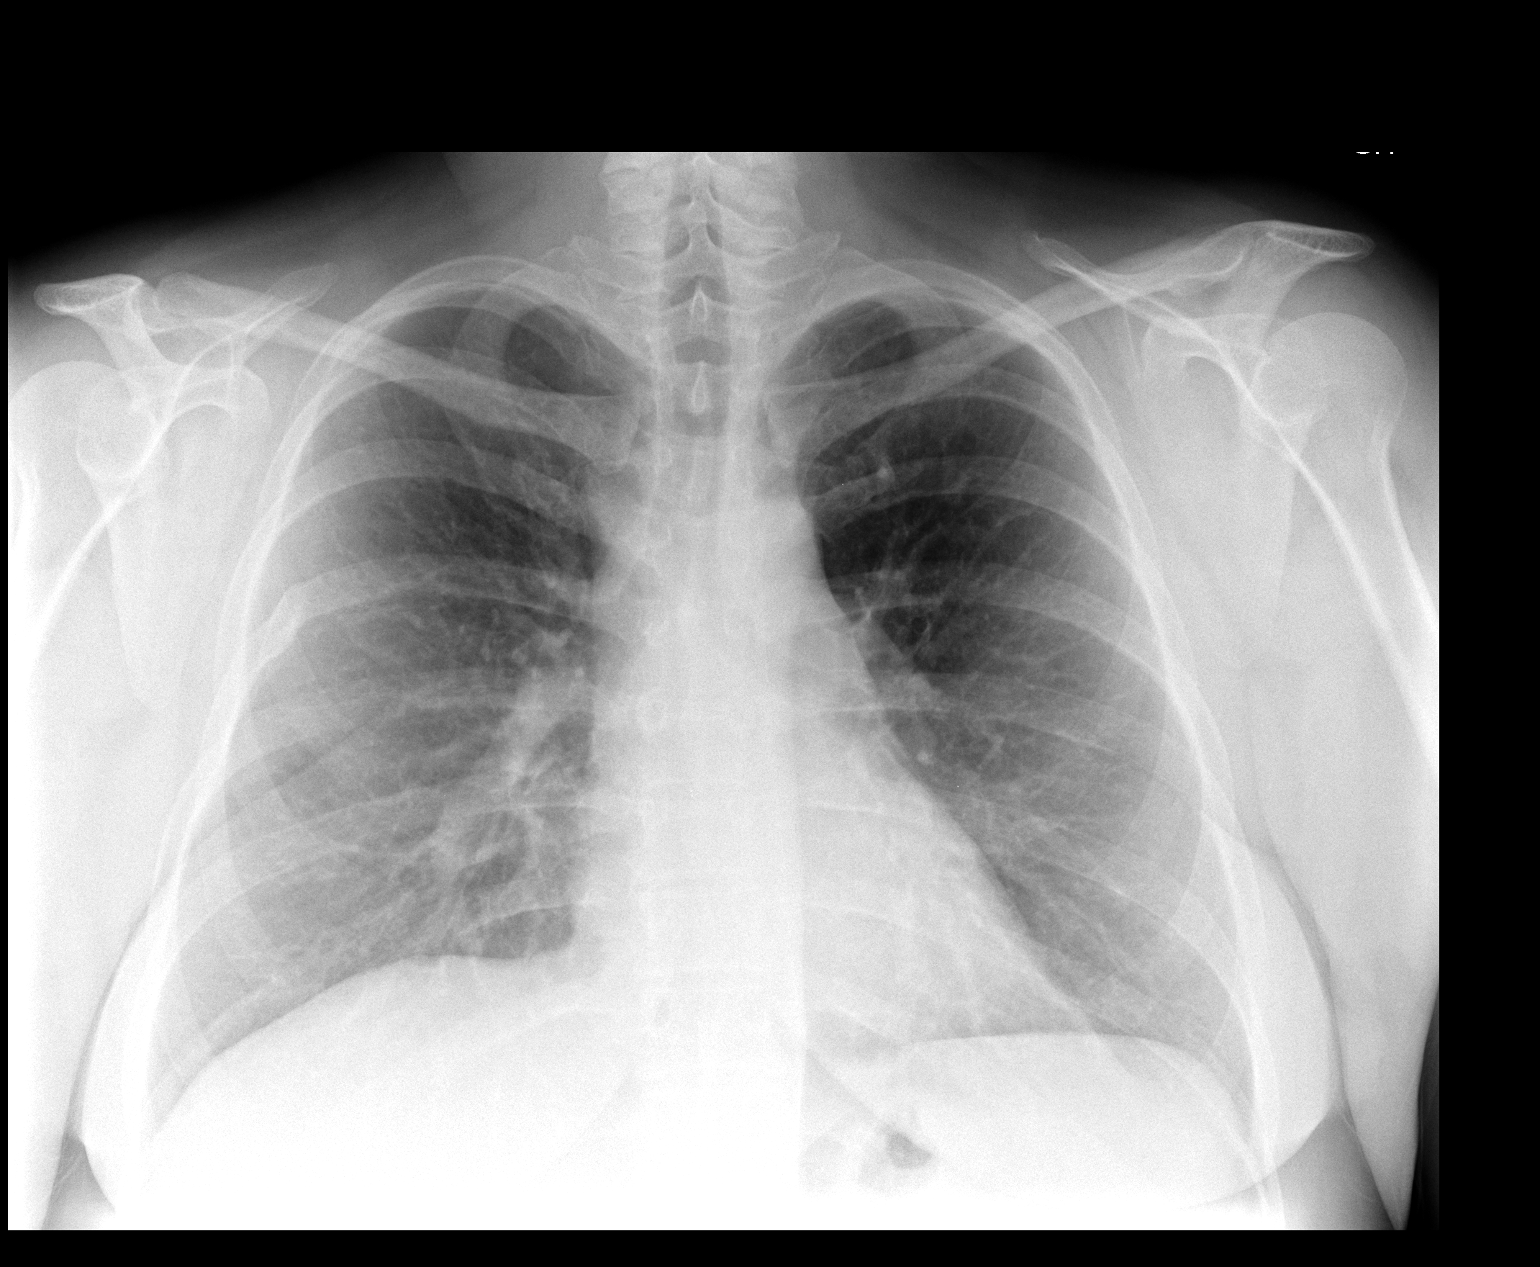

[view not recorded (2 of 2)]
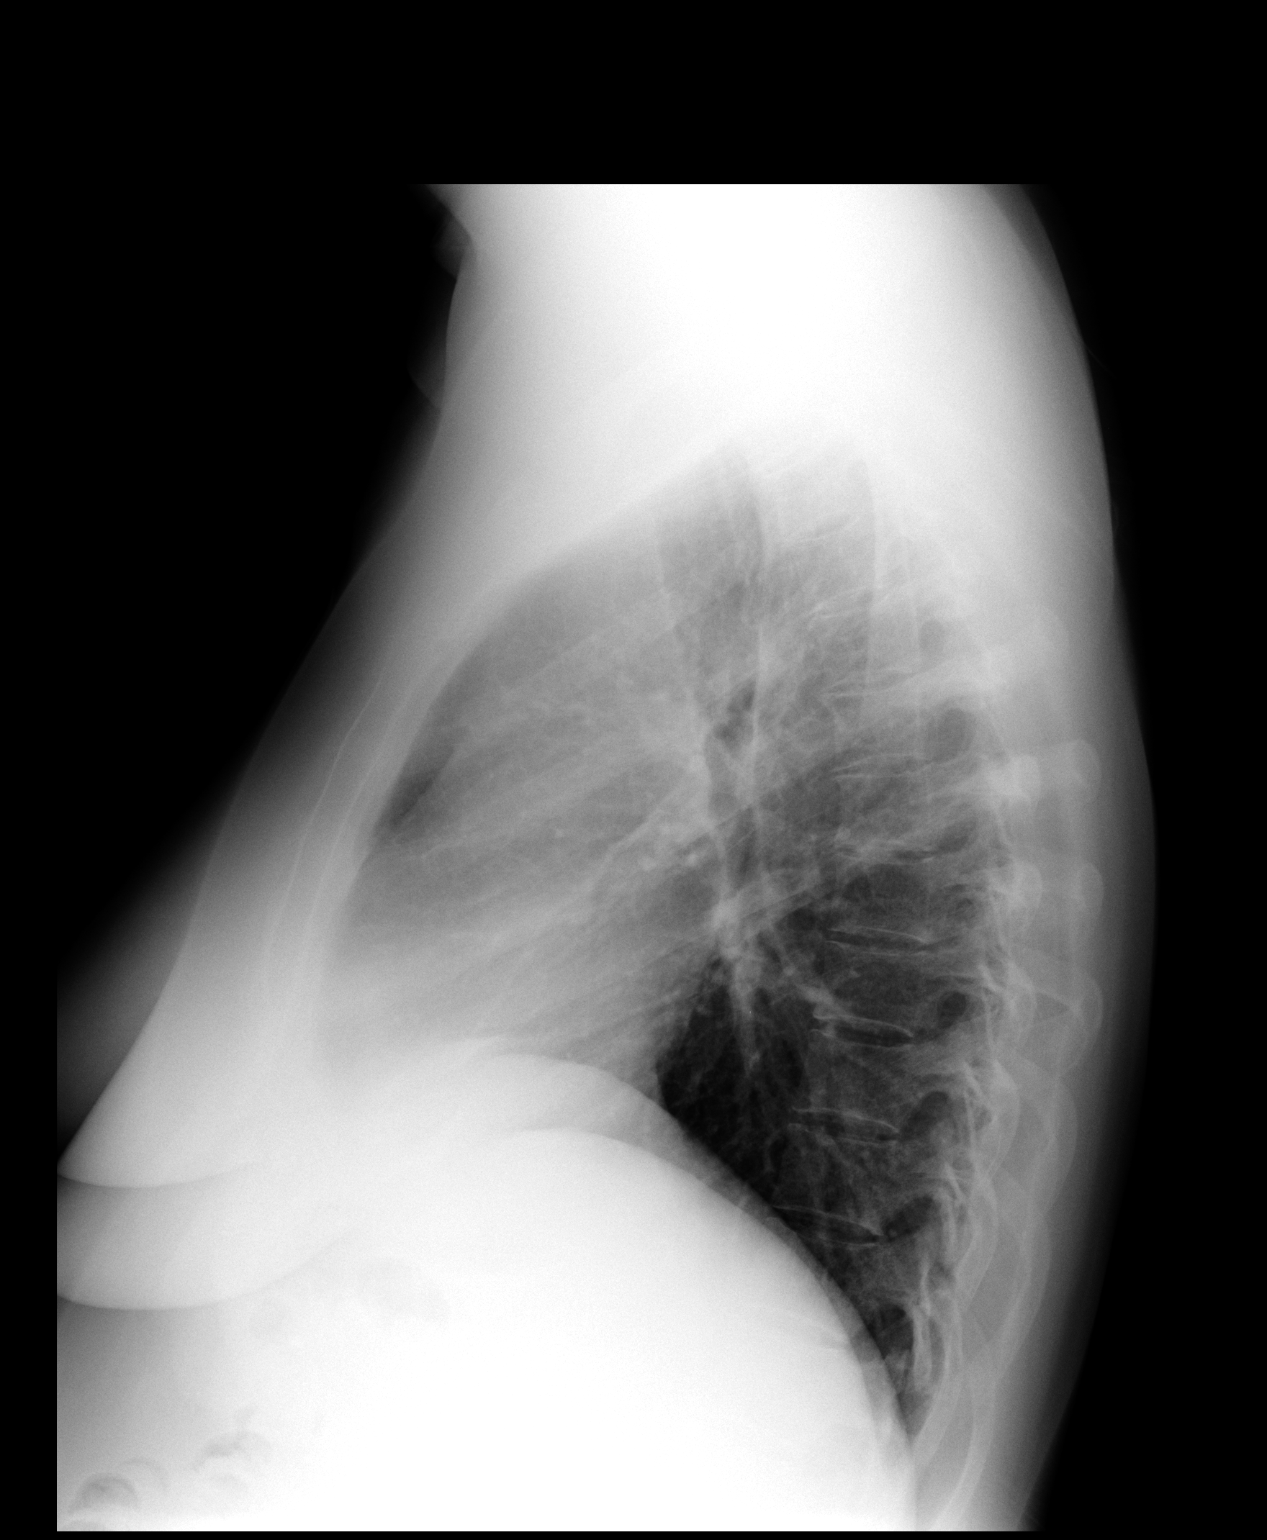

[2 of 2 positions shown; findings below may reference images not displayed]

FINDINGS: Two views of the chest demonstrate clear lungs. Heart and
mediastinum are within normal limits.  Again noted is an old right
sixth rib fracture.  Trachea is midline.
IMPRESSION: No acute cardiopulmonary disease.

## 2013-10-30 ENCOUNTER — Ambulatory Visit: Payer: Medicare Other | Admitting: Family Medicine

## 2013-10-30 DIAGNOSIS — Z881 Allergy status to other antibiotic agents status: Secondary | ICD-10-CM | POA: Diagnosis not present

## 2013-10-30 DIAGNOSIS — J45909 Unspecified asthma, uncomplicated: Secondary | ICD-10-CM | POA: Diagnosis not present

## 2013-10-30 DIAGNOSIS — E785 Hyperlipidemia, unspecified: Secondary | ICD-10-CM | POA: Diagnosis not present

## 2013-10-30 DIAGNOSIS — I471 Supraventricular tachycardia: Secondary | ICD-10-CM | POA: Diagnosis not present

## 2013-10-30 DIAGNOSIS — Z880 Allergy status to penicillin: Secondary | ICD-10-CM | POA: Diagnosis not present

## 2013-10-30 DIAGNOSIS — R0602 Shortness of breath: Secondary | ICD-10-CM | POA: Diagnosis not present

## 2013-10-30 DIAGNOSIS — Z91011 Allergy to milk products: Secondary | ICD-10-CM | POA: Diagnosis not present

## 2013-10-30 DIAGNOSIS — Z91010 Allergy to peanuts: Secondary | ICD-10-CM | POA: Diagnosis not present

## 2013-10-30 DIAGNOSIS — R0609 Other forms of dyspnea: Secondary | ICD-10-CM | POA: Diagnosis not present

## 2013-10-30 DIAGNOSIS — Z79899 Other long term (current) drug therapy: Secondary | ICD-10-CM | POA: Diagnosis not present

## 2013-10-30 DIAGNOSIS — Z882 Allergy status to sulfonamides status: Secondary | ICD-10-CM | POA: Diagnosis not present

## 2013-10-30 DIAGNOSIS — K219 Gastro-esophageal reflux disease without esophagitis: Secondary | ICD-10-CM | POA: Diagnosis not present

## 2013-10-30 DIAGNOSIS — Z87891 Personal history of nicotine dependence: Secondary | ICD-10-CM | POA: Diagnosis not present

## 2013-10-30 DIAGNOSIS — Z888 Allergy status to other drugs, medicaments and biological substances status: Secondary | ICD-10-CM | POA: Diagnosis not present

## 2013-10-30 DIAGNOSIS — Z886 Allergy status to analgesic agent status: Secondary | ICD-10-CM | POA: Diagnosis not present

## 2013-10-30 DIAGNOSIS — Z90710 Acquired absence of both cervix and uterus: Secondary | ICD-10-CM | POA: Diagnosis not present

## 2013-10-30 DIAGNOSIS — I10 Essential (primary) hypertension: Secondary | ICD-10-CM | POA: Diagnosis not present

## 2013-10-30 DIAGNOSIS — E269 Hyperaldosteronism, unspecified: Secondary | ICD-10-CM | POA: Diagnosis not present

## 2013-10-30 DIAGNOSIS — Z8542 Personal history of malignant neoplasm of other parts of uterus: Secondary | ICD-10-CM | POA: Diagnosis not present

## 2013-10-30 DIAGNOSIS — Z885 Allergy status to narcotic agent status: Secondary | ICD-10-CM | POA: Diagnosis not present

## 2013-10-30 DIAGNOSIS — F411 Generalized anxiety disorder: Secondary | ICD-10-CM | POA: Diagnosis not present

## 2013-10-30 DIAGNOSIS — R062 Wheezing: Secondary | ICD-10-CM | POA: Diagnosis not present

## 2013-10-31 ENCOUNTER — Ambulatory Visit (INDEPENDENT_AMBULATORY_CARE_PROVIDER_SITE_OTHER): Payer: Medicare Other | Admitting: Cardiology

## 2013-10-31 ENCOUNTER — Encounter: Payer: Self-pay | Admitting: Cardiology

## 2013-10-31 VITALS — BP 144/94 | HR 112 | Ht 62.0 in | Wt 183.0 lb

## 2013-10-31 DIAGNOSIS — R0609 Other forms of dyspnea: Secondary | ICD-10-CM | POA: Diagnosis not present

## 2013-10-31 DIAGNOSIS — I10 Essential (primary) hypertension: Secondary | ICD-10-CM | POA: Diagnosis not present

## 2013-10-31 DIAGNOSIS — R002 Palpitations: Secondary | ICD-10-CM

## 2013-10-31 DIAGNOSIS — R0789 Other chest pain: Secondary | ICD-10-CM

## 2013-10-31 DIAGNOSIS — R7309 Other abnormal glucose: Secondary | ICD-10-CM | POA: Diagnosis not present

## 2013-10-31 DIAGNOSIS — R000 Tachycardia, unspecified: Secondary | ICD-10-CM | POA: Diagnosis not present

## 2013-10-31 DIAGNOSIS — R062 Wheezing: Secondary | ICD-10-CM | POA: Diagnosis not present

## 2013-10-31 DIAGNOSIS — R42 Dizziness and giddiness: Secondary | ICD-10-CM | POA: Diagnosis not present

## 2013-10-31 DIAGNOSIS — G4730 Sleep apnea, unspecified: Secondary | ICD-10-CM | POA: Diagnosis not present

## 2013-10-31 DIAGNOSIS — R0602 Shortness of breath: Secondary | ICD-10-CM | POA: Diagnosis not present

## 2013-10-31 DIAGNOSIS — Z79899 Other long term (current) drug therapy: Secondary | ICD-10-CM | POA: Diagnosis not present

## 2013-10-31 DIAGNOSIS — Z8673 Personal history of transient ischemic attack (TIA), and cerebral infarction without residual deficits: Secondary | ICD-10-CM | POA: Diagnosis not present

## 2013-10-31 LAB — TSH: TSH: 2.776 u[IU]/mL (ref 0.350–4.500)

## 2013-10-31 LAB — CREATININE KINASE MB: CK, MB: <0.7 ng/mL (ref 0.0–5.0)

## 2013-10-31 LAB — D-DIMER, QUANTITATIVE: D-Dimer, Quant: 0.27 ug/mL-FEU (ref 0.00–0.48)

## 2013-10-31 LAB — BRAIN NATRIURETIC PEPTIDE: Brain Natriuretic Peptide: 6.7 pg/mL (ref 0.0–100.0)

## 2013-10-31 LAB — TROPONIN I: Troponin I: <0.01 ng/mL (ref <0.06)
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
## 2013-10-31 NOTE — Patient Instructions (Signed)
Your physician recommends that you continue on your current medications as directed. Please refer to the Current Medication list given to you today.  Your physician recommends that you go to the lab for STAT blood work  Your physician has recommended that you wear a holter monitor. Holter monitors are medical devices that record the heart's electrical activity. Doctors most often use these monitors to diagnose arrhythmias. Arrhythmias are problems with the speed or rhythm of the heartbeat. The monitor is a small, portable device. You can wear one while you do your normal daily activities. This is usually used to diagnose what is causing palpitations/syncope (passing out).  Your physician has recommended that you wear an event monitor. Event monitors are medical devices that record the heart's electrical activity. Doctors most often [place] these monitors to diagnose arrhythmias. Arrhythmias are problems with the speed or rhythm of the heartbeat. The monitor is a small, portable device. You can wear one while you do your normal daily activities. This is usually used to diagnose what is causing palpitations/syncope (passing out).  Your physician has requested that you have an echocardiogram. Echocardiography is a painless test that uses sound waves to create images of your heart. It provides your doctor with information about the size and shape of your heart and how well your heart's chambers and valves are working. This procedure takes approximately one hour. There are no restrictions for this procedure.  Your physician has requested that you have an exercise tolerance test. For further information please visit [email or web address][employer][email or web address]. Please also follow instruction sheet, as given.  Your physician recommends that you schedule a follow-up appointment As Needed

## 2013-11-02 DIAGNOSIS — R0602 Shortness of breath: Secondary | ICD-10-CM | POA: Insufficient documentation

## 2013-11-03 ENCOUNTER — Encounter (HOSPITAL_COMMUNITY): Payer: Self-pay | Admitting: Emergency Medicine

## 2013-11-03 ENCOUNTER — Encounter: Payer: Self-pay | Admitting: General Surgery

## 2013-11-03 ENCOUNTER — Emergency Department (HOSPITAL_COMMUNITY)
Admission: EM | Admit: 2013-11-03 | Discharge: 2013-11-03 | Disposition: A | Discharge status: Home / Self Care | Payer: Medicare Other | Attending: Emergency Medicine | Admitting: Emergency Medicine

## 2013-11-03 DIAGNOSIS — Z8614 Personal history of Methicillin resistant Staphylococcus aureus infection: Secondary | ICD-10-CM | POA: Diagnosis not present

## 2013-11-03 DIAGNOSIS — Z862 Personal history of diseases of the blood and blood-forming organs and certain disorders involving the immune mechanism: Secondary | ICD-10-CM | POA: Insufficient documentation

## 2013-11-03 DIAGNOSIS — Z9981 Dependence on supplemental oxygen: Secondary | ICD-10-CM | POA: Insufficient documentation

## 2013-11-03 DIAGNOSIS — Z8659 Personal history of other mental and behavioral disorders: Secondary | ICD-10-CM | POA: Diagnosis not present

## 2013-11-03 DIAGNOSIS — R0789 Other chest pain: Secondary | ICD-10-CM | POA: Diagnosis not present

## 2013-11-03 DIAGNOSIS — F411 Generalized anxiety disorder: Secondary | ICD-10-CM | POA: Diagnosis not present

## 2013-11-03 DIAGNOSIS — Z87891 Personal history of nicotine dependence: Secondary | ICD-10-CM | POA: Insufficient documentation

## 2013-11-03 DIAGNOSIS — Z880 Allergy status to penicillin: Secondary | ICD-10-CM | POA: Diagnosis not present

## 2013-11-03 DIAGNOSIS — Z8639 Personal history of other endocrine, nutritional and metabolic disease: Secondary | ICD-10-CM | POA: Insufficient documentation

## 2013-11-03 DIAGNOSIS — Z79899 Other long term (current) drug therapy: Secondary | ICD-10-CM | POA: Diagnosis not present

## 2013-11-03 DIAGNOSIS — J45909 Unspecified asthma, uncomplicated: Secondary | ICD-10-CM | POA: Insufficient documentation

## 2013-11-03 DIAGNOSIS — IMO0002 Reserved for concepts with insufficient information to code with codable children: Secondary | ICD-10-CM | POA: Insufficient documentation

## 2013-11-03 DIAGNOSIS — G8929 Other chronic pain: Secondary | ICD-10-CM | POA: Insufficient documentation

## 2013-11-03 DIAGNOSIS — R61 Generalized hyperhidrosis: Secondary | ICD-10-CM | POA: Insufficient documentation

## 2013-11-03 DIAGNOSIS — Z8669 Personal history of other diseases of the nervous system and sense organs: Secondary | ICD-10-CM | POA: Insufficient documentation

## 2013-11-03 DIAGNOSIS — Z8742 Personal history of other diseases of the female genital tract: Secondary | ICD-10-CM | POA: Diagnosis not present

## 2013-11-03 DIAGNOSIS — R0600 Dyspnea, unspecified: Secondary | ICD-10-CM | POA: Diagnosis present

## 2013-11-03 DIAGNOSIS — K219 Gastro-esophageal reflux disease without esophagitis: Secondary | ICD-10-CM | POA: Insufficient documentation

## 2013-11-03 DIAGNOSIS — G4730 Sleep apnea, unspecified: Secondary | ICD-10-CM | POA: Insufficient documentation

## 2013-11-03 DIAGNOSIS — Z8542 Personal history of malignant neoplasm of other parts of uterus: Secondary | ICD-10-CM | POA: Insufficient documentation

## 2013-11-03 DIAGNOSIS — F419 Anxiety disorder, unspecified: Secondary | ICD-10-CM

## 2013-11-03 DIAGNOSIS — I10 Essential (primary) hypertension: Secondary | ICD-10-CM | POA: Insufficient documentation

## 2013-11-03 DIAGNOSIS — Z90710 Acquired absence of both cervix and uterus: Secondary | ICD-10-CM | POA: Insufficient documentation

## 2013-11-03 DIAGNOSIS — R002 Palpitations: Secondary | ICD-10-CM | POA: Diagnosis not present

## 2013-11-03 DIAGNOSIS — R0609 Other forms of dyspnea: Secondary | ICD-10-CM | POA: Diagnosis present

## 2013-11-03 MED ORDER — NITROGLYCERIN 0.4 MG SL SUBL: 0.4000 mg | SUBLINGUAL_TABLET | SUBLINGUAL | Status: DC | PRN
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
## 2013-11-03 NOTE — Progress Notes (Signed)
Met patient at bedside.CM role explained.Patient tearful and reports she developed chest pain yesterday.( patient has been seen 17 times in ED in the past 6 months) Patient  Educated on importance of PCP follow up for management of her primary health care needs.Patient reports she has a cardiologist and is compliant with her follow up.CM Provided education on the services of the Primary care office,urgent care and ED.Patient reports she is aware of the services available to her.Patient reports her understanding  And states she is thankful for CM support.No further CM needs at this time.

## 2013-11-03 NOTE — ED Notes (Signed)
Pt reports SOb and chest pain that started yesterday while she was playing with her granddaughter at the playground yesterday. Pt states pain was in the left chest and shoulder. Pt states SOB with walking. Tearful. Reports seen by her cardiologist last Friday. They planned for her to get an echo and begin wearing holter monitor.

## 2013-11-03 NOTE — Discharge Instructions (Signed)
Chest Pain (Nonspecific) °It is often hard to give a specific diagnosis for the cause of chest pain. There is always a chance that your pain could be related to something serious, such as a heart attack or a blood clot in the lungs. You need to follow up with your caregiver for further evaluation. °CAUSES  °· Heartburn. °· Pneumonia or bronchitis. °· Anxiety or stress. °· Inflammation around your heart (pericarditis) or lung (pleuritis or pleurisy). °· A blood clot in the lung. °· A collapsed lung (pneumothorax). It can develop suddenly on its own (spontaneous pneumothorax) or from injury (trauma) to the chest. °· Shingles infection (herpes zoster virus). °The chest wall is composed of bones, muscles, and cartilage. Any of these can be the source of the pain. °· The bones can be bruised by injury. °· The muscles or cartilage can be strained by coughing or overwork. °· The cartilage can be affected by inflammation and become sore (costochondritis). °DIAGNOSIS  °Lab tests or other studies, such as X-rays, electrocardiography, stress testing, or cardiac imaging, may be needed to find the cause of your pain.  °TREATMENT  °· Treatment depends on what may be causing your chest pain. Treatment may include: °· Acid blockers for heartburn. °· Anti-inflammatory medicine. °· Pain medicine for inflammatory conditions. °· Antibiotics if an infection is present. °· You may be advised to change lifestyle habits. This includes stopping smoking and avoiding alcohol, caffeine, and chocolate. °· You may be advised to keep your head raised (elevated) when sleeping. This reduces the chance of acid going backward from your stomach into your esophagus. °· Most of the time, nonspecific chest pain will improve within 2 to 3 days with rest and mild pain medicine. °HOME CARE INSTRUCTIONS  °· If antibiotics were prescribed, take your antibiotics as directed. Finish them even if you start to feel better. °· For the next few days, avoid physical  activities that bring on chest pain. Continue physical activities as directed. °· Do not smoke. °· Avoid drinking alcohol. °· Only take over-the-counter or prescription medicine for pain, discomfort, or fever as directed by your caregiver. °· Follow your caregiver's suggestions for further testing if your chest pain does not go away. °· Keep any follow-up appointments you made. If you do not go to an appointment, you could develop lasting (chronic) problems with pain. If there is any problem keeping an appointment, you must call to reschedule. °SEEK MEDICAL CARE IF:  °· You think you are having problems from the medicine you are taking. Read your medicine instructions carefully. °· Your chest pain does not go away, even after treatment. °· You develop a rash with blisters on your chest. °SEEK IMMEDIATE MEDICAL CARE IF:  °· You have increased chest pain or pain that spreads to your arm, neck, jaw, back, or abdomen. °· You develop shortness of breath, an increasing cough, or you are coughing up blood. °· You have severe back or abdominal pain, feel nauseous, or vomit. °· You develop severe weakness, fainting, or chills. °· You have a fever. °THIS IS AN EMERGENCY. Do not wait to see if the pain will go away. Get medical help at once. Call your local emergency services (911 in [place] Do not drive yourself to the hospital. °MAKE SURE YOU:  °· Understand these instructions. °· Will watch your condition. °· Will get help right away if you are not doing well or get worse. °Document Released: 05/10/2005 Document Revised: 10/23/2011 Document Reviewed: 03/05/2008 °[employer]® Patient Information ©2014 [employer]   [employer]. ° °

## 2013-11-04 DIAGNOSIS — F411 Generalized anxiety disorder: Secondary | ICD-10-CM | POA: Diagnosis not present

## 2013-11-04 DIAGNOSIS — I10 Essential (primary) hypertension: Secondary | ICD-10-CM | POA: Diagnosis not present

## 2013-11-04 DIAGNOSIS — R220 Localized swelling, mass and lump, head: Secondary | ICD-10-CM | POA: Diagnosis not present

## 2013-11-04 DIAGNOSIS — R221 Localized swelling, mass and lump, neck: Secondary | ICD-10-CM | POA: Diagnosis not present

## 2013-11-04 DIAGNOSIS — X58XXXA Exposure to other specified factors, initial encounter: Secondary | ICD-10-CM | POA: Diagnosis not present

## 2013-11-04 DIAGNOSIS — R0789 Other chest pain: Secondary | ICD-10-CM | POA: Diagnosis not present

## 2013-11-04 DIAGNOSIS — R002 Palpitations: Secondary | ICD-10-CM | POA: Diagnosis not present

## 2013-11-04 DIAGNOSIS — F339 Major depressive disorder, recurrent, unspecified: Secondary | ICD-10-CM | POA: Diagnosis not present

## 2013-11-04 DIAGNOSIS — T781XXA Other adverse food reactions, not elsewhere classified, initial encounter: Secondary | ICD-10-CM | POA: Diagnosis not present

## 2013-11-06 ENCOUNTER — Encounter: Payer: Self-pay | Admitting: Family Medicine

## 2013-11-06 ENCOUNTER — Ambulatory Visit (INDEPENDENT_AMBULATORY_CARE_PROVIDER_SITE_OTHER): Payer: Medicare Other | Admitting: Family Medicine

## 2013-11-06 VITALS — BP 140/81 | HR 97 | Wt 183.0 lb

## 2013-11-06 DIAGNOSIS — G4700 Insomnia, unspecified: Secondary | ICD-10-CM | POA: Diagnosis not present

## 2013-11-06 DIAGNOSIS — R55 Syncope and collapse: Secondary | ICD-10-CM

## 2013-11-06 DIAGNOSIS — R279 Unspecified lack of coordination: Secondary | ICD-10-CM

## 2013-11-06 DIAGNOSIS — G4733 Obstructive sleep apnea (adult) (pediatric): Secondary | ICD-10-CM

## 2013-11-06 DIAGNOSIS — J309 Allergic rhinitis, unspecified: Secondary | ICD-10-CM

## 2013-11-06 DIAGNOSIS — F339 Major depressive disorder, recurrent, unspecified: Secondary | ICD-10-CM | POA: Diagnosis not present

## 2013-11-06 DIAGNOSIS — R270 Ataxia, unspecified: Secondary | ICD-10-CM

## 2013-11-06 NOTE — Progress Notes (Signed)
Subjective:    Patient ID: Jennifer [name], female    DOB: 1965/10/29, 48 y.o.   MRN: [id number]  HPI Insomnia - wants to know if ok to take melatonin.  Hasn't ben sleeping well since had her hysterectomy. Says has been told had some mild OSA in the past. Had test done at [facility]. She has been waking herself up with snoring.  Attemped CPAP twice but couldn't tolerate it. She is intrested in starting nasal pillars.    Having bilateral facial pain and pressure. Also some sharp pressure behind thr right eye.  bilat TMJ area is hurting as well.  Feels like her face is more swollen. No fever, chills, or sweats.  No ear pain. No discharge from the eyes. Her irritation. She has been doing her nasal saline rinses at least once a day. She wonders if this could be irritating her.  Depression-she's felt very down recently. She did start seeing a new therapist but decided to go back her her old therapist who has been following her for greater than 10 years. She's talked her about feeling very down. She's had times of feeling very hopeless. Currently she says her faith keeps her from wanting to harm herself. She's not currently on any medications as she reports having tried multiple drugs in the past with negative side effects.  Review of Systems     Objective:   Physical Exam  Constitutional: She is oriented to person, place, and time. She appears well-developed and well-nourished.  HENT:  Head: Normocephalic and atraumatic.  Right Ear: External ear normal.  Left Ear: External ear normal.  Nose: Nose normal.  Mouth/Throat: Oropharynx is clear and moist.  TMs and canals are clear. Nasal turbinates are erythematous. Left side of face is midly swollen.   Eyes: Conjunctivae and EOM are normal. Pupils are equal, round, and reactive to light.  Neck: Neck supple. No thyromegaly present.  Cardiovascular: Normal rate, regular rhythm and normal heart sounds.   Pulmonary/Chest: Effort normal and breath  sounds normal. She has no wheezes.  Musculoskeletal: She exhibits no edema.  Lymphadenopathy:    She has no cervical adenopathy.  Neurological: She is alert and oriented to person, place, and time.  Skin: Skin is warm and dry.  Psychiatric: She has a normal mood and affect.          Assessment & Plan:  Insomnia - Recommend a trial of melantonin.  I think this would be a great place to start. I think her lack of sleep is really contributing to her depressed mood. Explained her that this is a natural way to try to improve sleep and will not interfere with any of her medications. Certainly worth a try for at least 10 days. If she's not noticing any improvement then consider a medication such as trazodone. She is very shy of using medications. This could help her sleep and help elevate mood.  AR- I. think most of her symptoms are coming from allergic rhinitis. Allergy season started about 2 weeks ago in this area for spring allergies. Discussed treatment with topical nasal steroid spray. She says she or he has some Flonase at home. She's tried in the past and to give her nosebleeds I think she can at least use it temporarily to try to get some the pain, inflammation and swelling under good control. She hasn't tolerated oral antihistamines well in the past so we'll hold off on this. I did encourage her to be patient with a topical  nasal steroid sprays as they can often x2-5 days to to start working well.  OSA -- will look for report and see if we can try to order nasal pillows.     Acute on chronic depression-encouraged her continue work with her therapist or counselor. I still think she would be a great candidate for medication but we have discussed this several times in the past. Also encouraged her to let us work on some things like sleep.  She would also like referral to Dr. Berdine Addison, neurology for further evaluation of her near syncope and ataxia that she encounters at times. She is scheduled to  have a 30 day Holter monitor for her heart placed in about 2 weeks.   He also previously refer her to endocrinology back in the fall around the time she was scheduled to have her hysterectomy for endometrial cancer. She is ready to reschedule that at this point time would like to get the information.

## 2013-11-07 ENCOUNTER — Telehealth: Payer: Self-pay

## 2013-11-07 ENCOUNTER — Emergency Department (HOSPITAL_BASED_OUTPATIENT_CLINIC_OR_DEPARTMENT_OTHER)
Admission: EM | Admit: 2013-11-07 | Discharge: 2013-11-07 | Disposition: A | Discharge status: Home / Self Care | Payer: Medicare Other | Attending: Emergency Medicine | Admitting: Emergency Medicine

## 2013-11-07 ENCOUNTER — Encounter (HOSPITAL_BASED_OUTPATIENT_CLINIC_OR_DEPARTMENT_OTHER): Payer: Self-pay | Admitting: Emergency Medicine

## 2013-11-07 ENCOUNTER — Telehealth: Payer: Self-pay | Admitting: Family Medicine

## 2013-11-07 DIAGNOSIS — Z9889 Other specified postprocedural states: Secondary | ICD-10-CM | POA: Diagnosis not present

## 2013-11-07 DIAGNOSIS — M545 Low back pain, unspecified: Secondary | ICD-10-CM | POA: Diagnosis not present

## 2013-11-07 DIAGNOSIS — Z8614 Personal history of Methicillin resistant Staphylococcus aureus infection: Secondary | ICD-10-CM | POA: Insufficient documentation

## 2013-11-07 DIAGNOSIS — Z880 Allergy status to penicillin: Secondary | ICD-10-CM | POA: Insufficient documentation

## 2013-11-07 DIAGNOSIS — H5710 Ocular pain, unspecified eye: Secondary | ICD-10-CM | POA: Diagnosis not present

## 2013-11-07 DIAGNOSIS — J069 Acute upper respiratory infection, unspecified: Secondary | ICD-10-CM | POA: Insufficient documentation

## 2013-11-07 DIAGNOSIS — K219 Gastro-esophageal reflux disease without esophagitis: Secondary | ICD-10-CM | POA: Insufficient documentation

## 2013-11-07 DIAGNOSIS — I10 Essential (primary) hypertension: Secondary | ICD-10-CM | POA: Diagnosis not present

## 2013-11-07 DIAGNOSIS — R109 Unspecified abdominal pain: Secondary | ICD-10-CM

## 2013-11-07 DIAGNOSIS — Z8542 Personal history of malignant neoplasm of other parts of uterus: Secondary | ICD-10-CM | POA: Diagnosis not present

## 2013-11-07 DIAGNOSIS — R300 Dysuria: Secondary | ICD-10-CM | POA: Insufficient documentation

## 2013-11-07 DIAGNOSIS — G4730 Sleep apnea, unspecified: Secondary | ICD-10-CM | POA: Insufficient documentation

## 2013-11-07 DIAGNOSIS — R221 Localized swelling, mass and lump, neck: Secondary | ICD-10-CM | POA: Diagnosis not present

## 2013-11-07 DIAGNOSIS — R51 Headache: Secondary | ICD-10-CM | POA: Diagnosis not present

## 2013-11-07 DIAGNOSIS — Z85828 Personal history of other malignant neoplasm of skin: Secondary | ICD-10-CM | POA: Insufficient documentation

## 2013-11-07 DIAGNOSIS — Z8659 Personal history of other mental and behavioral disorders: Secondary | ICD-10-CM | POA: Insufficient documentation

## 2013-11-07 DIAGNOSIS — Z87891 Personal history of nicotine dependence: Secondary | ICD-10-CM | POA: Insufficient documentation

## 2013-11-07 DIAGNOSIS — J45909 Unspecified asthma, uncomplicated: Secondary | ICD-10-CM | POA: Insufficient documentation

## 2013-11-07 DIAGNOSIS — Z862 Personal history of diseases of the blood and blood-forming organs and certain disorders involving the immune mechanism: Secondary | ICD-10-CM | POA: Diagnosis not present

## 2013-11-07 DIAGNOSIS — H9209 Otalgia, unspecified ear: Secondary | ICD-10-CM | POA: Diagnosis not present

## 2013-11-07 DIAGNOSIS — R220 Localized swelling, mass and lump, head: Secondary | ICD-10-CM | POA: Insufficient documentation

## 2013-11-07 DIAGNOSIS — G8929 Other chronic pain: Secondary | ICD-10-CM | POA: Insufficient documentation

## 2013-11-07 DIAGNOSIS — Z8673 Personal history of transient ischemic attack (TIA), and cerebral infarction without residual deficits: Secondary | ICD-10-CM | POA: Diagnosis not present

## 2013-11-07 DIAGNOSIS — Z79899 Other long term (current) drug therapy: Secondary | ICD-10-CM | POA: Insufficient documentation

## 2013-11-07 DIAGNOSIS — G894 Chronic pain syndrome: Secondary | ICD-10-CM | POA: Diagnosis not present

## 2013-11-07 DIAGNOSIS — J019 Acute sinusitis, unspecified: Secondary | ICD-10-CM | POA: Diagnosis not present

## 2013-11-07 DIAGNOSIS — Z8639 Personal history of other endocrine, nutritional and metabolic disease: Secondary | ICD-10-CM | POA: Insufficient documentation

## 2013-11-07 LAB — URINALYSIS, ROUTINE W REFLEX MICROSCOPIC
Bilirubin Urine: NEGATIVE
Glucose, UA: NEGATIVE mg/dL
Hgb urine dipstick: NEGATIVE
Ketones, ur: NEGATIVE mg/dL
Nitrite: NEGATIVE
Protein, ur: NEGATIVE mg/dL
Specific Gravity, Urine: 1.023 (ref 1.005–1.030)
Urobilinogen, UA: 0.2 mg/dL (ref 0.0–1.0)
pH: 5.5 (ref 5.0–8.0)

## 2013-11-07 LAB — URINE MICROSCOPIC-ADD ON
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
## 2013-11-07 NOTE — Discharge Instructions (Signed)
Flank Pain °Flank pain refers to pain that is located on the side of the body between the upper abdomen and the back. The pain may occur over a short period of time (acute) or may be long-term or reoccurring (chronic). It may be mild or severe. Flank pain can be caused by many things. °CAUSES  °Some of the more common causes of flank pain include: °· Muscle strains.   °· Muscle spasms.   °· A disease of your spine (vertebral disk disease).   °· A lung infection (pneumonia).   °· Fluid around your lungs (pulmonary edema).   °· A kidney infection.   °· Kidney stones.   °· A very painful skin rash caused by the chickenpox virus (shingles).   °· Gallbladder disease.   °HOME CARE INSTRUCTIONS  °Home care will depend on the cause of your pain. In general, °· Rest as directed by your caregiver. °· Drink enough fluids to keep your urine clear or pale yellow. °· Only take over-the-counter or prescription medicines as directed by your caregiver. Some medicines may help relieve the pain. °· Tell your caregiver about any changes in your pain. °· Follow up with your caregiver as directed. °SEEK IMMEDIATE MEDICAL CARE IF:  °· Your pain is not controlled with medicine.   °· You have new or worsening symptoms. °· Your pain increases.   °· You have abdominal pain.   °· You have shortness of breath.   °· You have persistent nausea or vomiting.   °· You have swelling in your abdomen.   °· You feel faint or pass out.   °· You have blood in your urine. °· You have a fever or persistent symptoms for more than 2 3 days. °· You have a fever and your symptoms suddenly get worse. °MAKE SURE YOU:  °· Understand these instructions. °· Will watch your condition. °· Will get help right away if you are not doing well or get worse. °Document Released: 09/21/2005 Document Revised: 04/24/2012 Document Reviewed: 03/14/2012 °Exit[employer]® Patient Information ©2014 [employer]. ° °

## 2013-11-07 NOTE — Telephone Encounter (Signed)
Yes, I still think she should try it. I use it myself sometimes.

## 2013-11-07 NOTE — Telephone Encounter (Signed)
Patient would like to discuss this issue with Dr. Madilyn Fireman when she comes in for a appt

## 2013-11-07 NOTE — ED Notes (Signed)
Sore throat and nasal congestion. States she was seen yesterday by her MD for same and told she has allergies.

## 2013-11-07 NOTE — Telephone Encounter (Signed)
Jennifer [name] is wanting Dr Madilyn Fireman know what the back of the Melatonin bottle before she takes it. The bottle reads Consult your primary care doctor if you take sedatives, have a depressive disorder, have an autoimmune disease, intolerance to gluten or wheat before taking medication. She wants to know if Dr Madilyn Fireman thinks she should try the medication.

## 2013-11-07 NOTE — Telephone Encounter (Signed)
Spoke with Jeneen Rinks at [facility] and he informed me that medicare requires a new sleep study since she was noncompliant in 2009 and had her machine picked-up.Patient has to have a NEW sleep study done before we can do Cpap Thanks, Anderson Malta

## 2013-11-07 NOTE — Telephone Encounter (Signed)
Okay, please call patient and let her know about this. She's okay with getting a new sleep study then we can move forward with this. We can even do a home sleep study if she would like. These are usually less expensive and can be done in the comfort of her own home.

## 2013-11-08 NOTE — Telephone Encounter (Signed)
Patient advised.

## 2013-11-10 DIAGNOSIS — R079 Chest pain, unspecified: Secondary | ICD-10-CM | POA: Diagnosis not present

## 2013-11-10 DIAGNOSIS — I10 Essential (primary) hypertension: Secondary | ICD-10-CM | POA: Diagnosis not present

## 2013-11-10 DIAGNOSIS — Z888 Allergy status to other drugs, medicaments and biological substances status: Secondary | ICD-10-CM | POA: Diagnosis not present

## 2013-11-10 DIAGNOSIS — Z79899 Other long term (current) drug therapy: Secondary | ICD-10-CM | POA: Diagnosis not present

## 2013-11-10 DIAGNOSIS — R51 Headache: Secondary | ICD-10-CM | POA: Diagnosis not present

## 2013-11-10 DIAGNOSIS — M79609 Pain in unspecified limb: Secondary | ICD-10-CM | POA: Diagnosis not present

## 2013-11-10 DIAGNOSIS — R52 Pain, unspecified: Secondary | ICD-10-CM | POA: Diagnosis not present

## 2013-11-12 DIAGNOSIS — J45909 Unspecified asthma, uncomplicated: Secondary | ICD-10-CM | POA: Diagnosis not present

## 2013-11-12 DIAGNOSIS — J329 Chronic sinusitis, unspecified: Secondary | ICD-10-CM | POA: Diagnosis not present

## 2013-11-12 DIAGNOSIS — J309 Allergic rhinitis, unspecified: Secondary | ICD-10-CM | POA: Diagnosis not present

## 2013-11-13 ENCOUNTER — Ambulatory Visit (INDEPENDENT_AMBULATORY_CARE_PROVIDER_SITE_OTHER): Payer: Medicare Other | Admitting: Family Medicine

## 2013-11-13 ENCOUNTER — Telehealth: Payer: Self-pay | Admitting: *Deleted

## 2013-11-13 ENCOUNTER — Encounter: Payer: Self-pay | Admitting: Family Medicine

## 2013-11-13 VITALS — BP 152/96 | HR 101 | Temp 98.4°F | Wt 184.0 lb

## 2013-11-13 DIAGNOSIS — R000 Tachycardia, unspecified: Secondary | ICD-10-CM | POA: Diagnosis not present

## 2013-11-13 DIAGNOSIS — R220 Localized swelling, mass and lump, head: Secondary | ICD-10-CM

## 2013-11-13 DIAGNOSIS — J019 Acute sinusitis, unspecified: Secondary | ICD-10-CM

## 2013-11-13 DIAGNOSIS — Z888 Allergy status to other drugs, medicaments and biological substances status: Secondary | ICD-10-CM | POA: Diagnosis not present

## 2013-11-13 DIAGNOSIS — R221 Localized swelling, mass and lump, neck: Secondary | ICD-10-CM | POA: Diagnosis not present

## 2013-11-13 DIAGNOSIS — R300 Dysuria: Secondary | ICD-10-CM | POA: Diagnosis not present

## 2013-11-13 DIAGNOSIS — B370 Candidal stomatitis: Secondary | ICD-10-CM

## 2013-11-13 DIAGNOSIS — I10 Essential (primary) hypertension: Secondary | ICD-10-CM | POA: Diagnosis not present

## 2013-11-13 DIAGNOSIS — R1310 Dysphagia, unspecified: Secondary | ICD-10-CM | POA: Diagnosis not present

## 2013-11-13 DIAGNOSIS — J029 Acute pharyngitis, unspecified: Secondary | ICD-10-CM | POA: Diagnosis not present

## 2013-11-13 DIAGNOSIS — Z79899 Other long term (current) drug therapy: Secondary | ICD-10-CM | POA: Diagnosis not present

## 2013-11-13 LAB — POCT URINALYSIS DIPSTICK
Bilirubin, UA: NEGATIVE
Blood, UA: NEGATIVE
Glucose, UA: NEGATIVE
Ketones, UA: NEGATIVE
Leukocytes, UA: NEGATIVE
Nitrite, UA: NEGATIVE
Protein, UA: NEGATIVE
Spec Grav, UA: >=1.03
Urobilinogen, UA: 0.2
pH, UA: 6

## 2013-11-13 MED ORDER — NYSTATIN 100000 UNIT/ML MT SUSP: 5.0000 mL | Freq: Four times a day (QID) | OROMUCOSAL | Status: DC

## 2013-11-13 MED ORDER — TELMISARTAN 20 MG PO TABS: 10.0000 mg | ORAL_TABLET | Freq: Every day | ORAL | Status: DC

## 2013-11-13 MED ORDER — PREDNISONE 10 MG PO TABS: 10.0000 mg | ORAL_TABLET | Freq: Two times a day (BID) | ORAL | Status: DC

## 2013-11-13 NOTE — Telephone Encounter (Signed)
Pt is asking if Dr. Madilyn Fireman will write her an Rx for micardis .Jennifer [name]

## 2013-11-13 NOTE — Progress Notes (Signed)
Subjective:    Patient ID: Jennifer [name], female    DOB: [birth year], 48 y.o.   MRN: [id number]  HPI She is here to followup on her sinuses today. She was seen about a week ago and had a little bit of facial swelling pain and congestion. She has not tolerated oral antihistamines well in the past I have recommended a nasal steroid spray. She thought she still had some Flonase at home. She used it previously but had caused some nosebleeds. I encouraged her to restart it at least for short period get some acute relief and then stop it when she was feeling a little better. She says she used it for a few days but did not get any relief. Spring allergy season has started here locally.  Says hasn't been drinking much fluid.  Saw her allergist yesterday, Dr Starling Manns. Feels like her LN are swollen.   He felt like her symptoms were secondary to her mental exposure not necessarily allergens. Her turbinates were erythematous and swollen versus dull and purple. He recommended she try to nasal and have given her a sample. She has used it once so far. He recommended that she actually is Afrin first, and then used a Q. nasal. The Afrin is to only be used for 2 days.  She still having issues with tachycardia with her heart. She has been given Rx for metoprolol 12.5mg  ER but hasn't started it yet. She feels that her symptoms have been flaring recently.  Wants to know if ok to take the Micardis. Plans on seeing cardiology again soon. Seeing Dr. Radford Pax.   Complains of white coating on the tongue and some soreness on the posterior part of the time. She think she may be getting fresh again. Not really sure what would be causing this.  Review of Systems     Objective:   Physical Exam  Constitutional: She is oriented to person, place, and time. She appears well-developed and well-nourished.  HENT:  Head: Normocephalic and atraumatic.  Right Ear: External ear normal.  Left Ear: External ear normal.  Nose: Nose normal.   Mouth/Throat: Oropharynx is clear and moist.  TMs and canals are clear. There is a white coating on the tongue but is not thickened no surrounding erythema.  Eyes: Conjunctivae and EOM are normal. Pupils are equal, round, and reactive to light.  Neck: Neck supple. No thyromegaly present.  Mild bilateral anterior cervical adenopathy  Cardiovascular: Normal rate, regular rhythm and normal heart sounds.   Pulmonary/Chest: Effort normal and breath sounds normal. She has no wheezes.  Lymphadenopathy:    She has cervical adenopathy.  Neurological: She is alert and oriented to person, place, and time.  Skin: Skin is warm and dry.  Psychiatric: She has a normal mood and affect.          Assessment & Plan:  Acute sinusitis- has so many drug intolerance and no clear sign of bacterial infection.  I think using the Afrin to open up the sinuses a little and then using the Q. nasal is a great idea. I think she's listed as a full try. She might also noticed less irritation been using the Flonase which is more of a liquid product. Hopefully this should help.Marland Kitchen  Anterior cervical lymphadenopathy-gave her reassurance. This is secondary to inflammation, and possibly allergies. At this point I would recommend a trial of prednisone. She says she hasn't been within the past. I will give her low dose of 10 mg and have  her take it twice a day for 5 days. She can stop at any point in time.  Sinus tachycardia- rec retry metoprolol..  She is nervous about it, bc usually gets chest tightness with it.  Will restart micardis as well.    Thrush - will tx with nyastatin.

## 2013-11-17 DIAGNOSIS — I10 Essential (primary) hypertension: Secondary | ICD-10-CM | POA: Diagnosis not present

## 2013-11-17 DIAGNOSIS — Z79899 Other long term (current) drug therapy: Secondary | ICD-10-CM | POA: Diagnosis not present

## 2013-11-17 DIAGNOSIS — Z888 Allergy status to other drugs, medicaments and biological substances status: Secondary | ICD-10-CM | POA: Diagnosis not present

## 2013-11-17 DIAGNOSIS — H53149 Visual discomfort, unspecified: Secondary | ICD-10-CM | POA: Diagnosis not present

## 2013-11-17 DIAGNOSIS — R209 Unspecified disturbances of skin sensation: Secondary | ICD-10-CM | POA: Diagnosis not present

## 2013-11-17 DIAGNOSIS — F339 Major depressive disorder, recurrent, unspecified: Secondary | ICD-10-CM | POA: Diagnosis not present

## 2013-11-17 DIAGNOSIS — R112 Nausea with vomiting, unspecified: Secondary | ICD-10-CM | POA: Diagnosis not present

## 2013-11-20 ENCOUNTER — Ambulatory Visit (INDEPENDENT_AMBULATORY_CARE_PROVIDER_SITE_OTHER): Payer: Medicare Other | Admitting: Family Medicine

## 2013-11-20 ENCOUNTER — Encounter: Payer: Self-pay | Admitting: Family Medicine

## 2013-11-20 ENCOUNTER — Ambulatory Visit (INDEPENDENT_AMBULATORY_CARE_PROVIDER_SITE_OTHER): Payer: Medicare Other

## 2013-11-20 VITALS — BP 142/88 | HR 120 | Wt 184.0 lb

## 2013-11-20 DIAGNOSIS — R42 Dizziness and giddiness: Secondary | ICD-10-CM

## 2013-11-20 DIAGNOSIS — R5383 Other fatigue: Secondary | ICD-10-CM | POA: Diagnosis not present

## 2013-11-20 DIAGNOSIS — R5381 Other malaise: Secondary | ICD-10-CM | POA: Diagnosis not present

## 2013-11-20 DIAGNOSIS — E785 Hyperlipidemia, unspecified: Secondary | ICD-10-CM | POA: Diagnosis not present

## 2013-11-20 DIAGNOSIS — R079 Chest pain, unspecified: Secondary | ICD-10-CM | POA: Diagnosis not present

## 2013-11-20 DIAGNOSIS — R0789 Other chest pain: Secondary | ICD-10-CM

## 2013-11-20 DIAGNOSIS — R7301 Impaired fasting glucose: Secondary | ICD-10-CM

## 2013-11-20 DIAGNOSIS — R071 Chest pain on breathing: Secondary | ICD-10-CM

## 2013-11-20 DIAGNOSIS — I10 Essential (primary) hypertension: Secondary | ICD-10-CM

## 2013-11-20 DIAGNOSIS — R51 Headache: Secondary | ICD-10-CM | POA: Diagnosis not present

## 2013-11-20 DIAGNOSIS — R259 Unspecified abnormal involuntary movements: Secondary | ICD-10-CM | POA: Diagnosis not present

## 2013-11-20 DIAGNOSIS — T7840XA Allergy, unspecified, initial encounter: Secondary | ICD-10-CM | POA: Diagnosis not present

## 2013-11-20 DIAGNOSIS — G4733 Obstructive sleep apnea (adult) (pediatric): Secondary | ICD-10-CM

## 2013-11-20 DIAGNOSIS — R61 Generalized hyperhidrosis: Secondary | ICD-10-CM | POA: Diagnosis not present

## 2013-11-20 DIAGNOSIS — X58XXXA Exposure to other specified factors, initial encounter: Secondary | ICD-10-CM | POA: Diagnosis not present

## 2013-11-20 DIAGNOSIS — F411 Generalized anxiety disorder: Secondary | ICD-10-CM | POA: Diagnosis not present

## 2013-11-20 LAB — POCT GLYCOSYLATED HEMOGLOBIN (HGB A1C): Hemoglobin A1C: 5.6

## 2013-11-20 LAB — GLUCOSE, POCT (MANUAL RESULT ENTRY): POC Glucose: 126 mg/dl — AB (ref 70–99)

## 2013-11-20 IMAGING — CR DG CHEST 2V
2 series · 2 of 2 positions shown · non-contrast
Comparison: 08/05/2012

CLINICAL DATA: Cough, chest pain

CHEST - 2 VIEW

[view not recorded (1 of 2)]
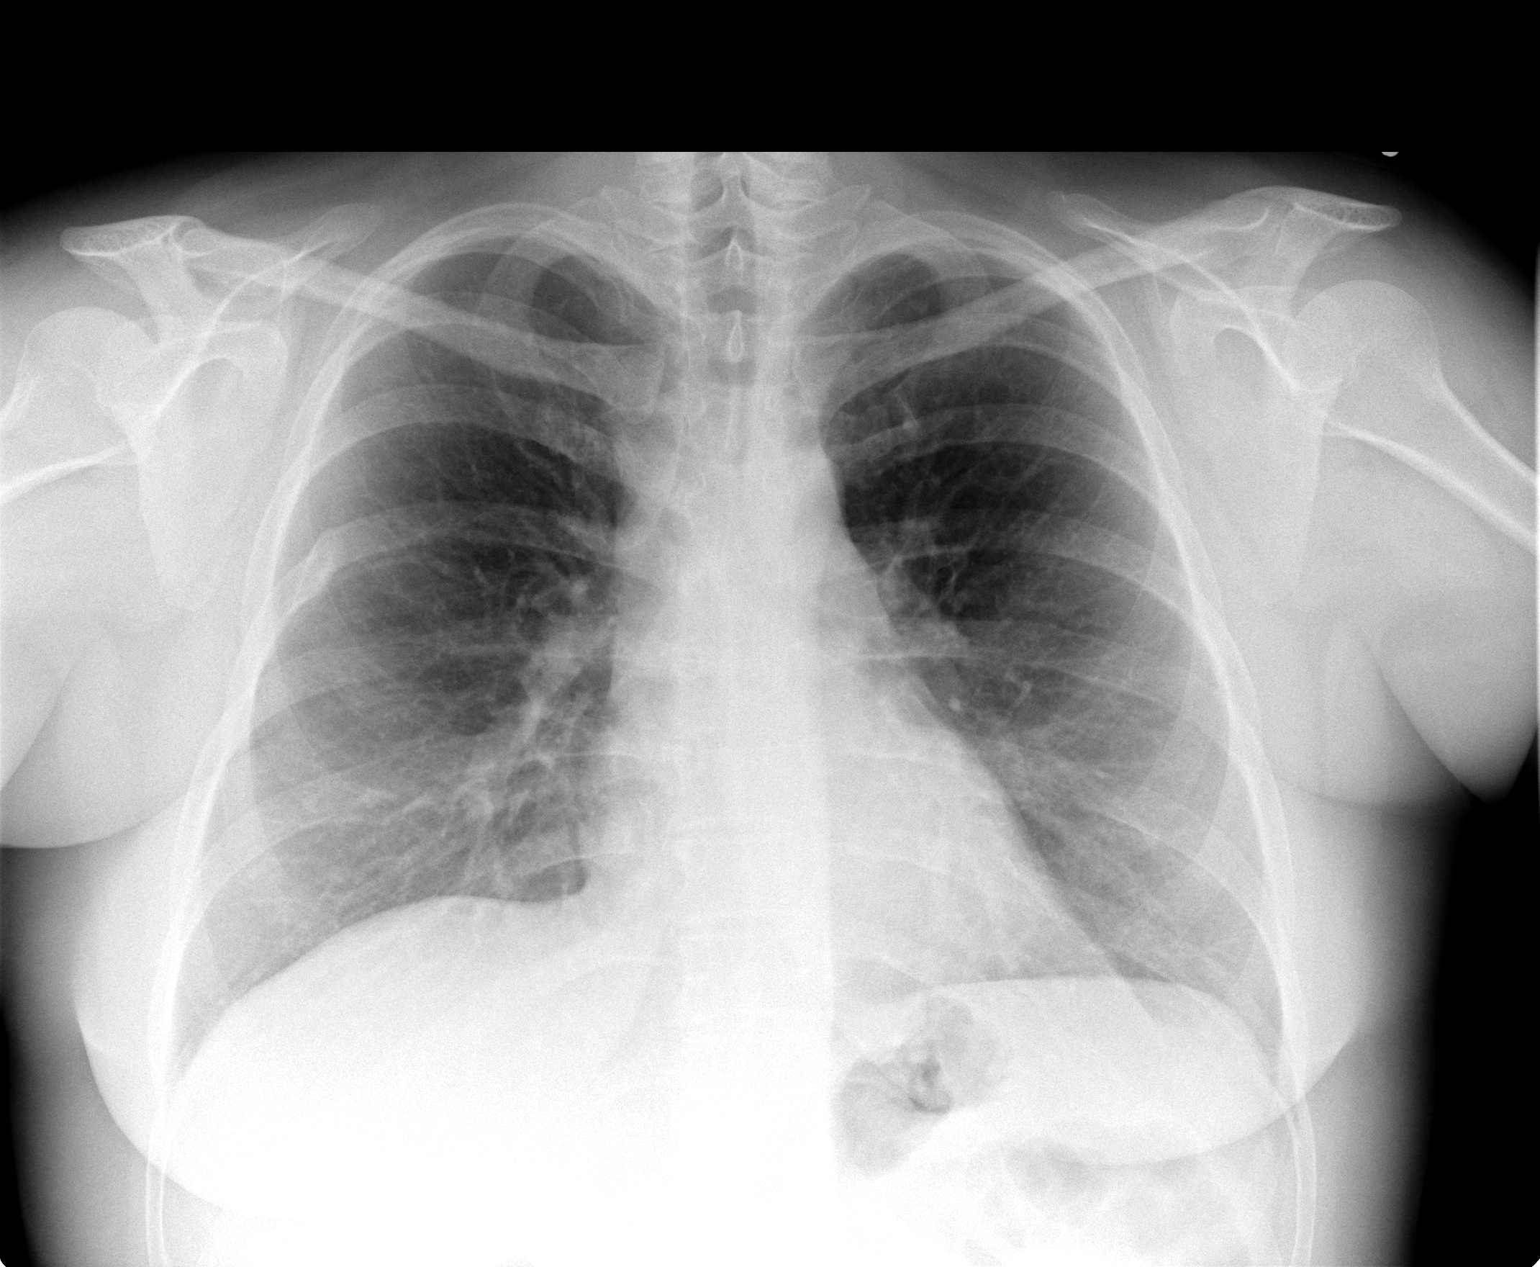

[view not recorded (2 of 2)]
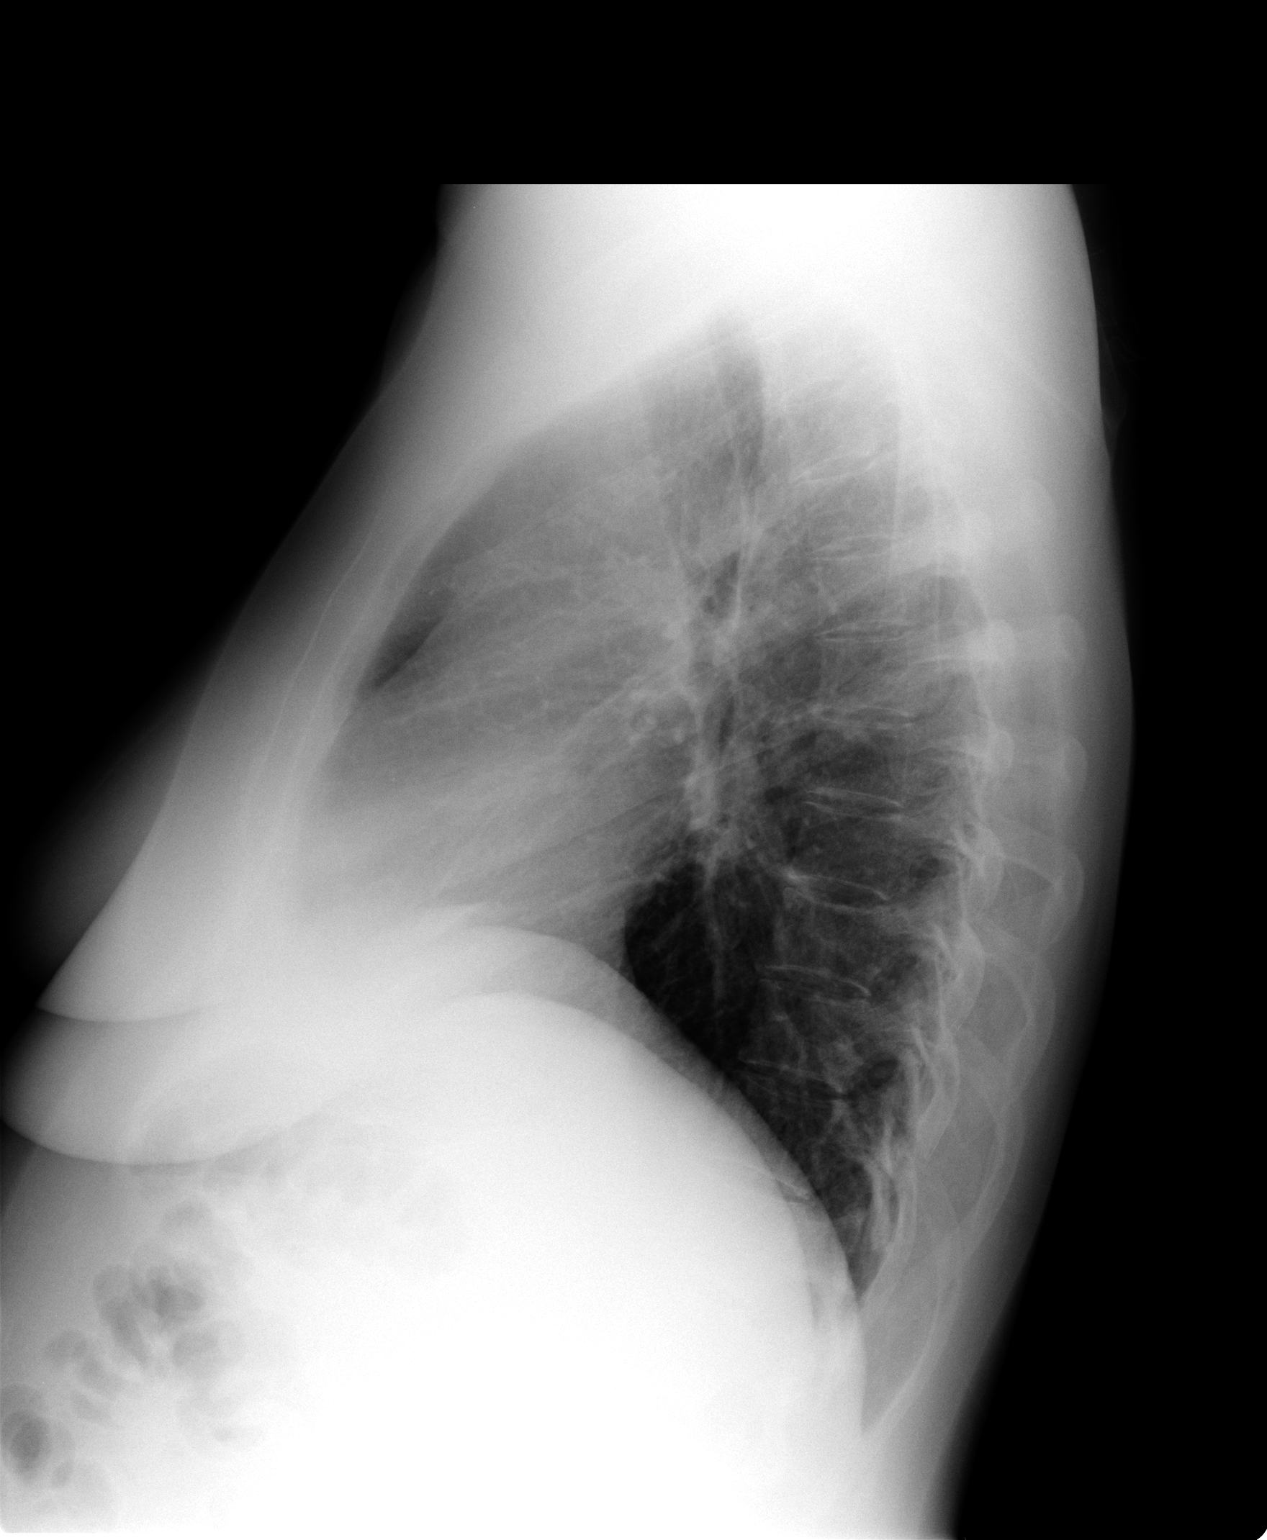

[2 of 2 positions shown; findings below may reference images not displayed]

FINDINGS: Normal heart size and vascularity.  Healed posterior
right rib fracture.  No focal pneumonia, collapse, consolidation,
edema, effusion or pneumothorax.  Trachea midline.
IMPRESSION: Stable exam.  No acute process.

## 2013-11-20 NOTE — Progress Notes (Signed)
Subjective:    Patient ID: Jennifer [name], female    DOB: April 27, 1966, 48 y.o.   MRN: [id number]  HPI IFG - Feels like is shaking in the inside but doesn't see a tremor. She feels like her legs are wobbly and she is unstable. She feels that she's off kilter. She said when she sits down she feels like she cannot fall back and maybe even pass out.  HTN - Taking Micardis but didn't take today.  Has had BPs in the 117 range and then will jump up once she is moving around.    Feels like her balance is off kilter right now.  She feel dizzy right now. Having double vision.  She still feels like she is swollen in her nasal passages and in her throat.  Some soreness on the right posterior side of the tongue.  Hurting across her shoulder and back. Did over due it at the gym and now her left shoulder is hurting.  She did eat this morning. She had toast eggs and juice. She tried to check her glucose at home but could not get her strips to work and her machine.  Inflammation of the nasal passages-she has been using her nasal steroid spray and tolerating it well so far. She still feels congested. She still feels like her nasal passages and throat are more swollen. She's tender on the right side of her neck as well. No fever chills or sweats. Review of Systems     Objective:   Physical Exam  Constitutional: She is oriented to person, place, and time. She appears well-developed and well-nourished.  HENT:  Head: Normocephalic and atraumatic.  Right Ear: External ear normal.  Left Ear: External ear normal.  Nose: Nose normal.  Mouth/Throat: Oropharynx is clear and moist.  TMs and canals are clear. There is are still mildly erythematous but are much less swollen. She also has no swelling under the eyes like she did when I saw her couple weeks ago.  Eyes: Conjunctivae and EOM are normal. Pupils are equal, round, and reactive to light.  Neck: Neck supple. No thyromegaly present.  Cardiovascular: Normal  rate, regular rhythm and normal heart sounds.   Pulmonary/Chest: Effort normal and breath sounds normal. She has no wheezes.  Lymphadenopathy:    She has no cervical adenopathy.  Neurological: She is alert and oriented to person, place, and time.  Skin: Skin is warm and dry.  Psychiatric: She has a normal mood and affect.          Assessment & Plan:  IFG - A1C is 5.6. Well controlled. Finger stick glucose is normal.    HTN- due for lipids. Uncontrolled. Hasn't taken her medication today though she says she doesn't usually take until mid day. She did bring it with her so asked her to take much easier.  Dizziness-rate is tachycardic today. We'll get an EKG.  Allergic rhinitis/inflammation of sinuses-I. do think she's getting a good response from the steroid. His turbinates are less swollen and she's not having any swelling over her face like she was 2 weeks ago. Encouraged her to stop the nasal rinses. I think it may actually be causing some irritation. The she very forceful he blows her nose which is irritating as well. Like to see her back in 2 weeks to recheck her turbinates.  EKG shows rate of 95 beats per minute, normal sinus rhythm. No acute ST-T wave changes. Short PR interval. Unchanged from previous.  [facility] - will  put i referral for home sleep sutdy.

## 2013-11-21 ENCOUNTER — Telehealth: Payer: Self-pay | Admitting: Cardiology

## 2013-11-21 ENCOUNTER — Encounter: Payer: Self-pay | Admitting: *Deleted

## 2013-11-21 DIAGNOSIS — R079 Chest pain, unspecified: Secondary | ICD-10-CM

## 2013-11-21 LAB — LIPID PANEL
Cholesterol: 195 mg/dL (ref 0–200)
HDL: 42 mg/dL
LDL Cholesterol: 123 mg/dL — ABNORMAL HIGH (ref 0–99)
Total CHOL/HDL Ratio: 4.6 ratio
Triglycerides: 148 mg/dL
VLDL: 30 mg/dL (ref 0–40)

## 2013-11-21 NOTE — Telephone Encounter (Signed)
New Message  Pt called requests a call back to discuss if her medication Telmisartan 20 mg and Metoprolol ER 25 mg  is ok to take before her stress test.

## 2013-11-21 NOTE — Telephone Encounter (Signed)
Patient reports that she has been prescribed metoprolol ER 12.5 mg daily since she was last seen by Dr. Radford Pax.   She was told yesterday by Dr. Madilyn Fireman to find out if she needs to hold prior to her GXT on 4/13.  I spoke with Andee Poles, Dr. Theodosia Blender primary [place], who states have patient hold the medication 24 hrs prior to the test.    Told patient to hold 4/12 and 4/13 until after the test is done.   Patient verbalizes understanding.  She states that she has had the metoprolol for about a week and has not started it yet because in the past she has not tolerated beta blockers, but was prescribed it due to a racing heart rate.

## 2013-11-23 DIAGNOSIS — Z882 Allergy status to sulfonamides status: Secondary | ICD-10-CM | POA: Diagnosis not present

## 2013-11-23 DIAGNOSIS — R109 Unspecified abdominal pain: Secondary | ICD-10-CM | POA: Diagnosis not present

## 2013-11-23 DIAGNOSIS — E785 Hyperlipidemia, unspecified: Secondary | ICD-10-CM | POA: Diagnosis not present

## 2013-11-23 DIAGNOSIS — M549 Dorsalgia, unspecified: Secondary | ICD-10-CM | POA: Diagnosis not present

## 2013-11-23 DIAGNOSIS — Z885 Allergy status to narcotic agent status: Secondary | ICD-10-CM | POA: Diagnosis not present

## 2013-11-23 DIAGNOSIS — R1011 Right upper quadrant pain: Secondary | ICD-10-CM | POA: Diagnosis not present

## 2013-11-23 DIAGNOSIS — Z888 Allergy status to other drugs, medicaments and biological substances status: Secondary | ICD-10-CM | POA: Diagnosis not present

## 2013-11-23 DIAGNOSIS — E269 Hyperaldosteronism, unspecified: Secondary | ICD-10-CM | POA: Diagnosis not present

## 2013-11-23 DIAGNOSIS — K8020 Calculus of gallbladder without cholecystitis without obstruction: Secondary | ICD-10-CM | POA: Diagnosis not present

## 2013-11-23 DIAGNOSIS — Z79899 Other long term (current) drug therapy: Secondary | ICD-10-CM | POA: Diagnosis not present

## 2013-11-23 DIAGNOSIS — K219 Gastro-esophageal reflux disease without esophagitis: Secondary | ICD-10-CM | POA: Diagnosis not present

## 2013-11-23 DIAGNOSIS — J45909 Unspecified asthma, uncomplicated: Secondary | ICD-10-CM | POA: Diagnosis not present

## 2013-11-23 DIAGNOSIS — K2270 Barrett's esophagus without dysplasia: Secondary | ICD-10-CM | POA: Diagnosis not present

## 2013-11-23 DIAGNOSIS — Z886 Allergy status to analgesic agent status: Secondary | ICD-10-CM | POA: Diagnosis not present

## 2013-11-23 DIAGNOSIS — Z87891 Personal history of nicotine dependence: Secondary | ICD-10-CM | POA: Diagnosis not present

## 2013-11-23 DIAGNOSIS — Z880 Allergy status to penicillin: Secondary | ICD-10-CM | POA: Diagnosis not present

## 2013-11-23 DIAGNOSIS — I10 Essential (primary) hypertension: Secondary | ICD-10-CM | POA: Diagnosis not present

## 2013-11-23 DIAGNOSIS — Z881 Allergy status to other antibiotic agents status: Secondary | ICD-10-CM | POA: Diagnosis not present

## 2013-11-23 DIAGNOSIS — K828 Other specified diseases of gallbladder: Secondary | ICD-10-CM | POA: Diagnosis not present

## 2013-11-23 DIAGNOSIS — Z9089 Acquired absence of other organs: Secondary | ICD-10-CM | POA: Diagnosis not present

## 2013-11-23 DIAGNOSIS — Z91010 Allergy to peanuts: Secondary | ICD-10-CM | POA: Diagnosis not present

## 2013-11-23 DIAGNOSIS — Z91011 Allergy to milk products: Secondary | ICD-10-CM | POA: Diagnosis not present

## 2013-11-23 DIAGNOSIS — Z8542 Personal history of malignant neoplasm of other parts of uterus: Secondary | ICD-10-CM | POA: Diagnosis not present

## 2013-11-24 ENCOUNTER — Encounter: Payer: Self-pay | Admitting: Cardiovascular Disease

## 2013-11-24 ENCOUNTER — Encounter: Payer: Self-pay | Admitting: *Deleted

## 2013-11-24 ENCOUNTER — Ambulatory Visit (INDEPENDENT_AMBULATORY_CARE_PROVIDER_SITE_OTHER): Payer: Medicare Other | Admitting: Physician Assistant

## 2013-11-24 ENCOUNTER — Emergency Department (HOSPITAL_BASED_OUTPATIENT_CLINIC_OR_DEPARTMENT_OTHER)
Admission: EM | Admit: 2013-11-24 | Discharge: 2013-11-24 | Disposition: A | Discharge status: Home / Self Care | Payer: Medicare Other | Attending: Emergency Medicine | Admitting: Emergency Medicine

## 2013-11-24 ENCOUNTER — Telehealth: Payer: Self-pay | Admitting: Cardiology

## 2013-11-24 ENCOUNTER — Ambulatory Visit (HOSPITAL_COMMUNITY): Payer: Medicare Other | Attending: Cardiovascular Disease | Admitting: Cardiology

## 2013-11-24 ENCOUNTER — Emergency Department (HOSPITAL_BASED_OUTPATIENT_CLINIC_OR_DEPARTMENT_OTHER): Payer: Medicare Other

## 2013-11-24 ENCOUNTER — Encounter (HOSPITAL_BASED_OUTPATIENT_CLINIC_OR_DEPARTMENT_OTHER): Payer: Self-pay | Admitting: Emergency Medicine

## 2013-11-24 DIAGNOSIS — Z8639 Personal history of other endocrine, nutritional and metabolic disease: Secondary | ICD-10-CM | POA: Diagnosis not present

## 2013-11-24 DIAGNOSIS — K8020 Calculus of gallbladder without cholecystitis without obstruction: Secondary | ICD-10-CM | POA: Insufficient documentation

## 2013-11-24 DIAGNOSIS — Z8614 Personal history of Methicillin resistant Staphylococcus aureus infection: Secondary | ICD-10-CM | POA: Diagnosis not present

## 2013-11-24 DIAGNOSIS — G4730 Sleep apnea, unspecified: Secondary | ICD-10-CM | POA: Diagnosis not present

## 2013-11-24 DIAGNOSIS — Z79899 Other long term (current) drug therapy: Secondary | ICD-10-CM | POA: Insufficient documentation

## 2013-11-24 DIAGNOSIS — Z862 Personal history of diseases of the blood and blood-forming organs and certain disorders involving the immune mechanism: Secondary | ICD-10-CM | POA: Diagnosis not present

## 2013-11-24 DIAGNOSIS — K219 Gastro-esophageal reflux disease without esophagitis: Secondary | ICD-10-CM | POA: Diagnosis not present

## 2013-11-24 DIAGNOSIS — Z8542 Personal history of malignant neoplasm of other parts of uterus: Secondary | ICD-10-CM | POA: Diagnosis not present

## 2013-11-24 DIAGNOSIS — I10 Essential (primary) hypertension: Secondary | ICD-10-CM | POA: Diagnosis not present

## 2013-11-24 DIAGNOSIS — Z880 Allergy status to penicillin: Secondary | ICD-10-CM | POA: Insufficient documentation

## 2013-11-24 DIAGNOSIS — Z8669 Personal history of other diseases of the nervous system and sense organs: Secondary | ICD-10-CM | POA: Diagnosis not present

## 2013-11-24 DIAGNOSIS — Z8659 Personal history of other mental and behavioral disorders: Secondary | ICD-10-CM | POA: Diagnosis not present

## 2013-11-24 DIAGNOSIS — Z87891 Personal history of nicotine dependence: Secondary | ICD-10-CM | POA: Insufficient documentation

## 2013-11-24 DIAGNOSIS — Z9851 Tubal ligation status: Secondary | ICD-10-CM | POA: Insufficient documentation

## 2013-11-24 DIAGNOSIS — Z8742 Personal history of other diseases of the female genital tract: Secondary | ICD-10-CM | POA: Diagnosis not present

## 2013-11-24 DIAGNOSIS — Z9089 Acquired absence of other organs: Secondary | ICD-10-CM | POA: Insufficient documentation

## 2013-11-24 DIAGNOSIS — Z9889 Other specified postprocedural states: Secondary | ICD-10-CM | POA: Diagnosis not present

## 2013-11-24 DIAGNOSIS — J45909 Unspecified asthma, uncomplicated: Secondary | ICD-10-CM | POA: Insufficient documentation

## 2013-11-24 DIAGNOSIS — R0602 Shortness of breath: Secondary | ICD-10-CM

## 2013-11-24 DIAGNOSIS — R002 Palpitations: Secondary | ICD-10-CM

## 2013-11-24 DIAGNOSIS — Z9981 Dependence on supplemental oxygen: Secondary | ICD-10-CM | POA: Insufficient documentation

## 2013-11-24 DIAGNOSIS — Z8709 Personal history of other diseases of the respiratory system: Secondary | ICD-10-CM | POA: Insufficient documentation

## 2013-11-24 LAB — COMPREHENSIVE METABOLIC PANEL
ALT: 29 U/L (ref 0–35)
AST: 18 U/L (ref 0–37)
Albumin: 3.7 g/dL (ref 3.5–5.2)
Alkaline Phosphatase: 75 U/L (ref 39–117)
BUN: 17 mg/dL (ref 6–23)
CO2: 23 mEq/L (ref 19–32)
Calcium: 9.2 mg/dL (ref 8.4–10.5)
Chloride: 108 mEq/L (ref 96–112)
Creatinine, Ser: 0.6 mg/dL (ref 0.50–1.10)
GFR calc Af Amer: >90 mL/min (ref >90)
GFR calc non Af Amer: >90 mL/min (ref >90)
Glucose, Bld: 109 mg/dL — ABNORMAL HIGH (ref 70–99)
Potassium: 3.7 mEq/L (ref 3.7–5.3)
Sodium: 144 mEq/L (ref 137–147)
Total Bilirubin: <0.2 mg/dL — ABNORMAL LOW (ref 0.3–1.2)
Total Protein: 7.2 g/dL (ref 6.0–8.3)

## 2013-11-24 LAB — URINALYSIS, ROUTINE W REFLEX MICROSCOPIC
Bilirubin Urine: NEGATIVE
Glucose, UA: NEGATIVE mg/dL
Hgb urine dipstick: NEGATIVE
Ketones, ur: NEGATIVE mg/dL
Leukocytes, UA: NEGATIVE
Nitrite: NEGATIVE
Protein, ur: NEGATIVE mg/dL
Specific Gravity, Urine: 1.028 (ref 1.005–1.030)
Urobilinogen, UA: 0.2 mg/dL (ref 0.0–1.0)
pH: 5 (ref 5.0–8.0)

## 2013-11-24 LAB — CBC WITH DIFFERENTIAL/PLATELET
Basophils Absolute: 0 10*3/uL (ref 0.0–0.1)
Basophils Relative: 0 % (ref 0–1)
Eosinophils Absolute: 0.1 10*3/uL (ref 0.0–0.7)
Eosinophils Relative: 2 % (ref 0–5)
HCT: 40.5 % (ref 36.0–46.0)
Hemoglobin: 13.2 g/dL (ref 12.0–15.0)
Lymphocytes Relative: 22 % (ref 12–46)
Lymphs Abs: 1.5 10*3/uL (ref 0.7–4.0)
MCH: 29.3 pg (ref 26.0–34.0)
MCHC: 32.6 g/dL (ref 30.0–36.0)
MCV: 90 fL (ref 78.0–100.0)
Monocytes Absolute: 0.7 10*3/uL (ref 0.1–1.0)
Monocytes Relative: 10 % (ref 3–12)
Neutro Abs: 4.7 10*3/uL (ref 1.7–7.7)
Neutrophils Relative %: 66 % (ref 43–77)
Platelets: 194 10*3/uL (ref 150–400)
RBC: 4.5 MIL/uL (ref 3.87–5.11)
RDW: 14.7 % (ref 11.5–15.5)
WBC: 7.1 10*3/uL (ref 4.0–10.5)

## 2013-11-24 LAB — D-DIMER, QUANTITATIVE (NOT AT ARMC): D-Dimer, Quant: <0.27 ug/mL-FEU (ref 0.00–0.48)

## 2013-11-24 LAB — LIPASE, BLOOD: Lipase: 42 U/L (ref 11–59)

## 2013-11-24 NOTE — ED Notes (Signed)
Ambulatory to restroom without difficulty.

## 2013-11-24 NOTE — ED Notes (Signed)
Pain rt upper quadrant that radiates to back  X 3 days

## 2013-11-24 NOTE — ED Notes (Signed)
Refuses to change into gown  States is allergic to our gowns
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
## 2013-11-24 NOTE — Discharge Instructions (Signed)
Biliary Colic  °Biliary colic is a steady or irregular pain in the upper abdomen. It is usually under the right side of the rib cage. It happens when gallstones interfere with the normal flow of bile from the gallbladder. Bile is a liquid that helps to digest fats. Bile is made in the liver and stored in the gallbladder. When you eat a meal, bile passes from the gallbladder through the cystic duct and the common bile duct into the small intestine. There, it mixes with partially digested food. If a gallstone blocks either of these ducts, the normal flow of bile is blocked. The muscle cells in the bile duct contract forcefully to try to move the stone. This causes the pain of biliary colic.  °SYMPTOMS  °· A person with biliary colic usually complains of pain in the upper abdomen. This pain can be: °· In the center of the upper abdomen just below the breastbone. °· In the upper-right part of the abdomen, near the gallbladder and liver. °· Spread back toward the right shoulder blade. °· Nausea and vomiting. °· The pain usually occurs after eating. °· Biliary colic is usually triggered by the digestive system's demand for bile. The demand for bile is high after fatty meals. Symptoms can also occur when a person who has been fasting suddenly eats a very large meal. Most episodes of biliary colic pass after 1 to 5 hours. After the most intense pain passes, your abdomen may continue to ache mildly for about 24 hours. °DIAGNOSIS  °After you describe your symptoms, your caregiver will perform a physical exam. He or she will pay attention to the upper right portion of your belly (abdomen). This is the area of your liver and gallbladder. An ultrasound will help your caregiver look for gallstones. Specialized scans of the gallbladder may also be done. Blood tests may be done, especially if you have fever or if your pain persists. °PREVENTION  °Biliary colic can be prevented by controlling the risk factors for gallstones. Some of  these risk factors, such as heredity, increasing age, and pregnancy are a normal part of life. Obesity and a high-fat diet are risk factors you can change through a healthy lifestyle. Women going through menopause who take hormone replacement therapy (estrogen) are also more likely to develop biliary colic. °TREATMENT  °· Pain medication may be prescribed. °· You may be encouraged to eat a fat-free diet. °· If the first episode of biliary colic is severe, or episodes of colic keep retuning, surgery to remove the gallbladder (cholecystectomy) is usually recommended. This procedure can be done through small incisions using an instrument called a laparoscope. The procedure often requires a brief stay in the hospital. Some people can leave the hospital the same day. It is the most widely used treatment in people troubled by painful gallstones. It is effective and safe, with no complications in more than 90% of cases. °· If surgery cannot be done, medication that dissolves gallstones may be used. This medication is expensive and can take months or years to work. Only small stones will dissolve. °· Rarely, medication to dissolve gallstones is combined with a procedure called shock-wave lithotripsy. This procedure uses carefully aimed shock waves to break up gallstones. In many people treated with this procedure, gallstones form again within a few years. °PROGNOSIS  °If gallstones block your cystic duct or common bile duct, you are at risk for repeated episodes of biliary colic. There is also a 25% chance that you will develop   a gallbladder infection(acute cholecystitis), or some other complication of gallstones within 10 to 20 years. If you have surgery, schedule it at a time that is convenient for you and at a time when you are not sick. °HOME CARE INSTRUCTIONS  °· Drink plenty of clear fluids. °· Avoid fatty, greasy or fried foods, or any foods that make your pain worse. °· Take medications as directed. °SEEK MEDICAL  CARE IF:  °· You develop a fever over 100.5° F (38.1° C). °· Your pain gets worse over time. °· You develop nausea that prevents you from eating and drinking. °· You develop vomiting. °SEEK IMMEDIATE MEDICAL CARE IF:  °· You have continuous or severe belly (abdominal) pain which is not relieved with medications. °· You develop nausea and vomiting which is not relieved with medications. °· You have symptoms of biliary colic and you suddenly develop a fever and shaking chills. This may signal cholecystitis. Call your caregiver immediately. °· You develop a yellow color to your skin or the white part of your eyes (jaundice). °Document Released: 01/01/2006 Document Revised: 10/23/2011 Document Reviewed: 03/12/2008 °Exit[employer]® Patient Information ©2014 [employer]. ° °

## 2013-11-24 NOTE — Telephone Encounter (Signed)
Please let patient know that ETT showed slurring of the ST segments associated with CP.  Please set up for stress myoview

## 2013-11-24 NOTE — Progress Notes (Signed)
Patient ID: Jennifer [name], female   DOB: [birth year], 48 y.o.   MRN: [id number] Exercise Treadmill Test  Pre-Exercise Testing Evaluation Rhythm: normal sinus  Rate: 86     Test  Exercise Tolerance Test Ordering MD: Vonna Drafts  Interpreting MD: Estella Husk, PA-C  Unique Test No: 1  Treadmill:  1  Indication for ETT: SOB  Contraindication to ETT: No   Stress Modality: exercise - treadmill  Cardiac Imaging Performed: non   Protocol: standard Bruce - maximal  Max BP:  207/74  Max MPHR (bpm):  172 85% MPR (bpm):  146  MPHR obtained (bpm):  176 % MPHR obtained:  102  Reached 85% MPHR (min:sec): 4:04 Total Exercise Time (min-sec):  8:11  Workload in METS: 10.1 Borg Scale: 18  Reason ETT Terminated:  Leg pain, sob    ST Segment Analysis At Rest: non-specific ST segment slurring With Exercise: non-specific ST changes  Other Information Arrhythmia:  No Angina during ETT:  absent (0) Quality of ETT:  diagnostic  ETT Interpretation:  borderline (indeterminate) with non-specific ST changes  Comments: Good exercise tolerance. Mild chest pain, nonspecific ST changes.  Recommendations: F/u Dr. Radford Pax

## 2013-11-24 NOTE — ED Notes (Signed)
Patient transported to Ultrasound 

## 2013-11-24 NOTE — Progress Notes (Signed)
Echo performed. 

## 2013-11-24 NOTE — Progress Notes (Signed)
Patient ID: Jennifer [name], female   DOB: 16-Jul-1966, 48 y.o.   MRN: [id number] [employer] Act Ex 24 hour holter to 30 day cardiac event monitor applied to patient. [employer] contacted to send sensitive skin electrodes and Red Dot electrodes to the patients' home.

## 2013-11-25 ENCOUNTER — Encounter: Payer: Self-pay | Admitting: Family Medicine

## 2013-11-25 ENCOUNTER — Ambulatory Visit (INDEPENDENT_AMBULATORY_CARE_PROVIDER_SITE_OTHER): Payer: Medicare Other | Admitting: Family Medicine

## 2013-11-25 ENCOUNTER — Telehealth: Payer: Self-pay | Admitting: Family Medicine

## 2013-11-25 VITALS — BP 143/94 | HR 106 | Wt 183.0 lb

## 2013-11-25 DIAGNOSIS — K219 Gastro-esophageal reflux disease without esophagitis: Secondary | ICD-10-CM | POA: Diagnosis not present

## 2013-11-25 DIAGNOSIS — M546 Pain in thoracic spine: Secondary | ICD-10-CM

## 2013-11-25 DIAGNOSIS — G4710 Hypersomnia, unspecified: Secondary | ICD-10-CM | POA: Diagnosis not present

## 2013-11-25 DIAGNOSIS — K8020 Calculus of gallbladder without cholecystitis without obstruction: Secondary | ICD-10-CM | POA: Diagnosis not present

## 2013-11-25 DIAGNOSIS — R002 Palpitations: Secondary | ICD-10-CM | POA: Diagnosis not present

## 2013-11-25 DIAGNOSIS — M549 Dorsalgia, unspecified: Secondary | ICD-10-CM

## 2013-11-25 DIAGNOSIS — G4730 Sleep apnea, unspecified: Secondary | ICD-10-CM | POA: Diagnosis not present

## 2013-11-25 NOTE — Telephone Encounter (Signed)
Per Jari Sportsman route message for triage to call patient with test results.

## 2013-11-25 NOTE — Telephone Encounter (Signed)
Dr. Madilyn Fireman, Stefhanie wants you to know the name of the Blood Pressure med that her sister takes is Marisa Severin, and would this benefit her?? She also wants you to review the message from [facility] in her chart and communicate with Dr.Turner to see what exactly is going on. Call patient and let her know what you think?

## 2013-11-25 NOTE — Telephone Encounter (Signed)
Pt was made aware by Winifred Olive.
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
## 2013-11-25 NOTE — Telephone Encounter (Signed)
Follow up    Pt returning this office's call and would like a detailed message left on her phone please if she does not answer.  She is at a Psychologist, sport and exercise office right now. Pt is concerned.

## 2013-11-25 NOTE — Telephone Encounter (Signed)
Called Pt in regards to her echo and GXT results. Advised for need of scheduling stress myoview test per Dr. Radford Pax. Pt is aware that a [facility] will call her with a date and time for the test.

## 2013-11-26 DIAGNOSIS — G4710 Hypersomnia, unspecified: Secondary | ICD-10-CM | POA: Diagnosis not present

## 2013-11-26 DIAGNOSIS — G4730 Sleep apnea, unspecified: Secondary | ICD-10-CM | POA: Diagnosis not present

## 2013-11-26 DIAGNOSIS — F339 Major depressive disorder, recurrent, unspecified: Secondary | ICD-10-CM | POA: Diagnosis not present

## 2013-11-26 NOTE — Telephone Encounter (Signed)
We could consider teh [place] as BP options.  Wont help control her palpiations though.

## 2013-11-27 ENCOUNTER — Ambulatory Visit: Payer: Medicare Other | Admitting: Family Medicine

## 2013-11-27 DIAGNOSIS — R5381 Other malaise: Secondary | ICD-10-CM | POA: Diagnosis not present

## 2013-11-27 DIAGNOSIS — R5383 Other fatigue: Secondary | ICD-10-CM | POA: Diagnosis not present

## 2013-11-27 DIAGNOSIS — M542 Cervicalgia: Secondary | ICD-10-CM | POA: Diagnosis not present

## 2013-11-27 DIAGNOSIS — R51 Headache: Secondary | ICD-10-CM | POA: Diagnosis not present

## 2013-11-27 DIAGNOSIS — R269 Unspecified abnormalities of gait and mobility: Secondary | ICD-10-CM | POA: Diagnosis not present

## 2013-11-27 DIAGNOSIS — R262 Difficulty in walking, not elsewhere classified: Secondary | ICD-10-CM | POA: Diagnosis not present

## 2013-11-27 NOTE — Telephone Encounter (Signed)
Pt informed. She would like to try this.Teddy Spike'

## 2013-11-28 ENCOUNTER — Ambulatory Visit (INDEPENDENT_AMBULATORY_CARE_PROVIDER_SITE_OTHER): Payer: Medicare Other | Admitting: Physician Assistant

## 2013-11-28 ENCOUNTER — Telehealth: Payer: Self-pay | Admitting: Cardiology

## 2013-11-28 ENCOUNTER — Encounter: Payer: Self-pay | Admitting: Physician Assistant

## 2013-11-28 VITALS — BP 149/94 | HR 100 | Ht 62.0 in

## 2013-11-28 DIAGNOSIS — L259 Unspecified contact dermatitis, unspecified cause: Secondary | ICD-10-CM | POA: Diagnosis not present

## 2013-11-28 DIAGNOSIS — I10 Essential (primary) hypertension: Secondary | ICD-10-CM | POA: Diagnosis not present

## 2013-11-28 DIAGNOSIS — Z79899 Other long term (current) drug therapy: Secondary | ICD-10-CM | POA: Diagnosis not present

## 2013-11-28 DIAGNOSIS — L239 Allergic contact dermatitis, unspecified cause: Secondary | ICD-10-CM

## 2013-11-28 DIAGNOSIS — R21 Rash and other nonspecific skin eruption: Secondary | ICD-10-CM | POA: Diagnosis not present

## 2013-11-28 DIAGNOSIS — T7840XA Allergy, unspecified, initial encounter: Secondary | ICD-10-CM | POA: Diagnosis not present

## 2013-11-28 DIAGNOSIS — X58XXXA Exposure to other specified factors, initial encounter: Secondary | ICD-10-CM | POA: Diagnosis not present

## 2013-11-28 DIAGNOSIS — F411 Generalized anxiety disorder: Secondary | ICD-10-CM | POA: Diagnosis not present

## 2013-11-28 DIAGNOSIS — Z888 Allergy status to other drugs, medicaments and biological substances status: Secondary | ICD-10-CM | POA: Diagnosis not present

## 2013-11-28 MED ORDER — DESONIDE 0.05 % EX GEL: Freq: Two times a day (BID) | CUTANEOUS | Status: DC

## 2013-11-28 MED ORDER — DEXAMETHASONE 4 MG PO TABS: 4.0000 mg | ORAL_TABLET | Freq: Two times a day (BID) | ORAL | Status: DC

## 2013-11-28 MED ORDER — BECLOMETHASONE DIPROPIONATE 80 MCG/ACT NA AERS: INHALATION_SPRAY | NASAL | Status: DC

## 2013-11-28 MED ORDER — ALISKIREN FUMARATE 150 MG PO TABS: 150.0000 mg | ORAL_TABLET | Freq: Every day | ORAL | Status: DC

## 2013-11-28 NOTE — Progress Notes (Signed)
   Subjective:    Patient ID: Jennifer [name], female    DOB: October 04, 1965, 48 y.o.   MRN: [id number]  HPI Pt presents to the clinic with a rash around her mouth for 2 days. Pt recently had reaction to heart monitor adhesive and had to take monitor off early. She still have red, itchy rash where heart monitor was. She also had sleep study done 2 nights ago with nasal canula. She is concerned because she has numerous allergies/sensitivies. She did feel like her lips were swelling yesterday but that has stopped and she is not short of breath today. She has used benadryl cream with not much relief. She has stopped wearing heart montior as well as will not wear nasal canula again.    Review of Systems     Objective:   Physical Exam  Constitutional: She is oriented to person, place, and time. She appears well-developed and well-nourished.  HENT:  Head: Normocephalic and atraumatic.    Mouth/Throat: Oropharynx is clear and moist.  Cardiovascular: Normal rate, regular rhythm and normal heart sounds.   Pulmonary/Chest: Effort normal and breath sounds normal. She has no wheezes.    Abdominal:    Neurological: She is alert and oriented to person, place, and time.  Psychiatric:  anxious          Assessment & Plan:  Rash/allergic reaction- I do feel like pt's face rash either due to sleep apnea device reaction or transferrance from adhesive rash on chest and abdomen . at this point I do not feel like she needs an oral steroid. I feel like desonide cream BID will significantly help. Pt was very persistent and wanted steroid. She has tolerated decadron in the past. I did print out rx and give to her only if symptoms worsened. Discussed if develops lip swelling, difficultly swallowing, worsening rash to start oral steroid. Call office as needed.

## 2013-11-28 NOTE — Telephone Encounter (Signed)
Ok, sent to [facility].  

## 2013-11-28 NOTE — Telephone Encounter (Signed)
Pt informed while in office today.Teddy Spike

## 2013-11-28 NOTE — Telephone Encounter (Signed)
Please let patient know that heart monitor showed NSR with extra heart beats from the top of the heart which are benign.  Average HR is 101bpm with min HR 63bpm and max HR 146bpm. Since the Holter did not show any signicant arrhythmias please order an event monitor

## 2013-11-28 NOTE — Patient Instructions (Signed)
Do not use decadron until try desonide for 3 days.

## 2013-11-28 NOTE — Telephone Encounter (Signed)
Pt is aware and Jennifer [name] has ordered the monitor that does not stick to pt bc she has had a horrible break out due to the electrodes.

## 2013-11-29 DIAGNOSIS — I10 Essential (primary) hypertension: Secondary | ICD-10-CM | POA: Diagnosis not present

## 2013-11-29 DIAGNOSIS — Z87891 Personal history of nicotine dependence: Secondary | ICD-10-CM | POA: Diagnosis not present

## 2013-11-29 DIAGNOSIS — Z885 Allergy status to narcotic agent status: Secondary | ICD-10-CM | POA: Diagnosis not present

## 2013-11-29 DIAGNOSIS — R05 Cough: Secondary | ICD-10-CM | POA: Diagnosis not present

## 2013-11-29 DIAGNOSIS — Z886 Allergy status to analgesic agent status: Secondary | ICD-10-CM | POA: Diagnosis not present

## 2013-11-29 DIAGNOSIS — Z91018 Allergy to other foods: Secondary | ICD-10-CM | POA: Diagnosis not present

## 2013-11-29 DIAGNOSIS — E785 Hyperlipidemia, unspecified: Secondary | ICD-10-CM | POA: Diagnosis not present

## 2013-11-29 DIAGNOSIS — E269 Hyperaldosteronism, unspecified: Secondary | ICD-10-CM | POA: Diagnosis not present

## 2013-11-29 DIAGNOSIS — D509 Iron deficiency anemia, unspecified: Secondary | ICD-10-CM | POA: Diagnosis not present

## 2013-11-29 DIAGNOSIS — Z791 Long term (current) use of non-steroidal anti-inflammatories (NSAID): Secondary | ICD-10-CM | POA: Diagnosis not present

## 2013-11-29 DIAGNOSIS — I471 Supraventricular tachycardia: Secondary | ICD-10-CM | POA: Diagnosis not present

## 2013-11-29 DIAGNOSIS — F411 Generalized anxiety disorder: Secondary | ICD-10-CM | POA: Diagnosis not present

## 2013-11-29 DIAGNOSIS — K2270 Barrett's esophagus without dysplasia: Secondary | ICD-10-CM | POA: Diagnosis not present

## 2013-11-29 DIAGNOSIS — R002 Palpitations: Secondary | ICD-10-CM | POA: Diagnosis not present

## 2013-11-29 DIAGNOSIS — R079 Chest pain, unspecified: Secondary | ICD-10-CM | POA: Diagnosis not present

## 2013-11-29 DIAGNOSIS — R059 Cough, unspecified: Secondary | ICD-10-CM | POA: Diagnosis not present

## 2013-11-29 DIAGNOSIS — Z881 Allergy status to other antibiotic agents status: Secondary | ICD-10-CM | POA: Diagnosis not present

## 2013-11-29 DIAGNOSIS — G40802 Other epilepsy, not intractable, without status epilepticus: Secondary | ICD-10-CM | POA: Diagnosis not present

## 2013-11-29 DIAGNOSIS — Z91010 Allergy to peanuts: Secondary | ICD-10-CM | POA: Diagnosis not present

## 2013-11-29 DIAGNOSIS — K219 Gastro-esophageal reflux disease without esophagitis: Secondary | ICD-10-CM | POA: Diagnosis not present

## 2013-11-29 DIAGNOSIS — Z888 Allergy status to other drugs, medicaments and biological substances status: Secondary | ICD-10-CM | POA: Diagnosis not present

## 2013-11-29 DIAGNOSIS — Z79899 Other long term (current) drug therapy: Secondary | ICD-10-CM | POA: Diagnosis not present

## 2013-12-01 ENCOUNTER — Telehealth: Payer: Self-pay | Admitting: Cardiology

## 2013-12-01 DIAGNOSIS — I10 Essential (primary) hypertension: Secondary | ICD-10-CM | POA: Diagnosis not present

## 2013-12-01 DIAGNOSIS — Z888 Allergy status to other drugs, medicaments and biological substances status: Secondary | ICD-10-CM | POA: Diagnosis not present

## 2013-12-01 DIAGNOSIS — Z79899 Other long term (current) drug therapy: Secondary | ICD-10-CM | POA: Diagnosis not present

## 2013-12-01 DIAGNOSIS — F450 Somatization disorder: Secondary | ICD-10-CM | POA: Diagnosis not present

## 2013-12-01 DIAGNOSIS — R5381 Other malaise: Secondary | ICD-10-CM | POA: Diagnosis not present

## 2013-12-01 DIAGNOSIS — R42 Dizziness and giddiness: Secondary | ICD-10-CM | POA: Diagnosis not present

## 2013-12-01 DIAGNOSIS — R5383 Other fatigue: Secondary | ICD-10-CM | POA: Diagnosis not present

## 2013-12-01 DIAGNOSIS — F411 Generalized anxiety disorder: Secondary | ICD-10-CM | POA: Diagnosis not present

## 2013-12-01 NOTE — Telephone Encounter (Signed)
Pt called saying no one gave her any instructions or directions about her Nuclear stress test tomorrow. I gave her directions to the [facility] office and told her to be NPO after midnight.   Kerin Ransom PA-C 12/01/2013 5:55 PM

## 2013-12-02 ENCOUNTER — Emergency Department (HOSPITAL_COMMUNITY)
Admission: EM | Admit: 2013-12-02 | Discharge: 2013-12-02 | Disposition: A | Discharge status: Home / Self Care | Payer: Medicare Other | Attending: Emergency Medicine | Admitting: Emergency Medicine

## 2013-12-02 ENCOUNTER — Encounter (HOSPITAL_COMMUNITY): Payer: Self-pay | Admitting: Emergency Medicine

## 2013-12-02 ENCOUNTER — Telehealth: Payer: Self-pay | Admitting: *Deleted

## 2013-12-02 ENCOUNTER — Ambulatory Visit (HOSPITAL_COMMUNITY)
Admission: RE | Admit: 2013-12-02 | Discharge: 2013-12-02 | Disposition: A | Discharge status: Home / Self Care | Payer: Medicare Other | Source: Ambulatory Visit | Attending: Cardiology | Admitting: Cardiology

## 2013-12-02 DIAGNOSIS — G8929 Other chronic pain: Secondary | ICD-10-CM | POA: Diagnosis not present

## 2013-12-02 DIAGNOSIS — I10 Essential (primary) hypertension: Secondary | ICD-10-CM | POA: Insufficient documentation

## 2013-12-02 DIAGNOSIS — Z87891 Personal history of nicotine dependence: Secondary | ICD-10-CM | POA: Diagnosis not present

## 2013-12-02 DIAGNOSIS — J45909 Unspecified asthma, uncomplicated: Secondary | ICD-10-CM | POA: Diagnosis not present

## 2013-12-02 DIAGNOSIS — F450 Somatization disorder: Secondary | ICD-10-CM | POA: Diagnosis not present

## 2013-12-02 DIAGNOSIS — G4730 Sleep apnea, unspecified: Secondary | ICD-10-CM | POA: Insufficient documentation

## 2013-12-02 DIAGNOSIS — Z862 Personal history of diseases of the blood and blood-forming organs and certain disorders involving the immune mechanism: Secondary | ICD-10-CM | POA: Diagnosis not present

## 2013-12-02 DIAGNOSIS — Z79899 Other long term (current) drug therapy: Secondary | ICD-10-CM | POA: Diagnosis not present

## 2013-12-02 DIAGNOSIS — Z8542 Personal history of malignant neoplasm of other parts of uterus: Secondary | ICD-10-CM | POA: Diagnosis not present

## 2013-12-02 DIAGNOSIS — K219 Gastro-esophageal reflux disease without esophagitis: Secondary | ICD-10-CM | POA: Insufficient documentation

## 2013-12-02 DIAGNOSIS — M79609 Pain in unspecified limb: Secondary | ICD-10-CM | POA: Diagnosis not present

## 2013-12-02 DIAGNOSIS — Z8742 Personal history of other diseases of the female genital tract: Secondary | ICD-10-CM | POA: Diagnosis not present

## 2013-12-02 DIAGNOSIS — F419 Anxiety disorder, unspecified: Secondary | ICD-10-CM

## 2013-12-02 DIAGNOSIS — Z880 Allergy status to penicillin: Secondary | ICD-10-CM | POA: Insufficient documentation

## 2013-12-02 DIAGNOSIS — Z8669 Personal history of other diseases of the nervous system and sense organs: Secondary | ICD-10-CM | POA: Insufficient documentation

## 2013-12-02 DIAGNOSIS — IMO0002 Reserved for concepts with insufficient information to code with codable children: Secondary | ICD-10-CM | POA: Diagnosis not present

## 2013-12-02 DIAGNOSIS — Z8614 Personal history of Methicillin resistant Staphylococcus aureus infection: Secondary | ICD-10-CM | POA: Diagnosis not present

## 2013-12-02 DIAGNOSIS — Z8639 Personal history of other endocrine, nutritional and metabolic disease: Secondary | ICD-10-CM | POA: Insufficient documentation

## 2013-12-02 DIAGNOSIS — R079 Chest pain, unspecified: Secondary | ICD-10-CM | POA: Diagnosis not present

## 2013-12-02 DIAGNOSIS — F411 Generalized anxiety disorder: Secondary | ICD-10-CM | POA: Diagnosis not present

## 2013-12-02 DIAGNOSIS — F489 Nonpsychotic mental disorder, unspecified: Secondary | ICD-10-CM | POA: Insufficient documentation

## 2013-12-02 DIAGNOSIS — Z9889 Other specified postprocedural states: Secondary | ICD-10-CM | POA: Diagnosis not present

## 2013-12-02 DIAGNOSIS — R6889 Other general symptoms and signs: Secondary | ICD-10-CM

## 2013-12-02 MED ORDER — TECHNETIUM TC 99M SESTAMIBI GENERIC - CARDIOLITE
30.8000 | Freq: Once | INTRAVENOUS | Status: AC | PRN
  Administered 2013-12-02: 30.8 via INTRAVENOUS

## 2013-12-02 MED ORDER — TECHNETIUM TC 99M SESTAMIBI GENERIC - CARDIOLITE
10.5000 | Freq: Once | INTRAVENOUS | Status: AC | PRN
  Administered 2013-12-02: 11 via INTRAVENOUS
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
## 2013-12-02 NOTE — Telephone Encounter (Signed)
Pt here in waiting area requesting to be seen. Went to waiting area to address pt's concerns. Pt advised she is having pain to right pelvic area does not wish to take medication as she has many allergies and does not want to feel drowsy or knocked out. Discussed Tylenol or Ibuprofen for the discomfort pt agreed she does take either of these and it helps. Pt  is concerned about how to deal with the emotions she is having after surgery and feels like there may be a lack of support from husband with her concerns. Pt also expressed she is angry about having any and all of these problems, she sees a therapist in [place] but feels they do not understand what she is going through. Asked pt how I could help her today pt asked for a f/u appt with Dr. Alycia Rossetti and something to help with the discomfort with intercourse,  Pt given a pamphlet for the Pelvic pain clinic with name and number to call for additional assistance regarding her concerns, a f/u appt with MD and Lannie Fields for the discomfort. Pt verbalized this was all she needed at this time. Pt thanked [place] for the help, denied needing further assistance. 77mins spent counseling and discussing pt concerns.

## 2013-12-02 NOTE — Procedures (Addendum)
[facility] [address] [place] [address] [phone]  Cardiology Nuclear Med Study  Jennifer [name] is a 48 y.o. female     MRN : [id number]     DOB: 12-03-1965  Procedure Date: 12/02/2013  Nuclear Med Background Indication for Stress Test:  Evaluation for Ischemia and Surgical Clearance History:  Asthma and atrial tachycardia;seizures;no prior Nuclear MPI on record for comparison. Cardiac Risk Factors: Family History - CAD, History of Smoking, Hypertension, Lipids and Obesity  Symptoms:  Chest Pain, Dizziness, DOE, Fatigue, Light-Headedness, Palpitations and SOB   Nuclear Pre-Procedure Caffeine/Decaff Intake:  12:00am NPO After: 10am   IV Site: R Forearm  IV 0.9% NS with Angio Cath:  22g  Chest Size (in):  n/a IV Started by: Rolene Course, RN  Height: 5\' 2"  (1.575 m)  Cup Size: C  BMI:  Body mass index is 33.46 kg/(m^2). Weight:  183 lb (83.008 kg)   Tech Comments:  n/a    Nuclear Med Study 1 or 2 day study: 1 day  Stress Test Type:  Stress  Order Authorizing Provider:  Fransico Him, MD   Resting Radionuclide: Technetium 50m Sestamibi  Resting Radionuclide Dose: 10.5 mCi   Stress Radionuclide:  Technetium 39m Sestamibi  Stress Radionuclide Dose: 30.8 mCi           Stress Protocol Rest HR: 93 Stress HR: 151  Rest BP: 155/91 Stress BP: 165/72  Exercise Time (min): 7 METS: 7.   Predicted Max HR: 172 bpm % Max HR: 87.79 bpm Rate Pressure Product: 70215  Dose of Adenosine (mg):  n/a Dose of Lexiscan: n/a mg  Dose of Atropine (mg): n/a Dose of Dobutamine: n/a mcg/kg/min (at max HR)  Stress Test Technologist: Leane Para, CCT Nuclear Technologist: Imagene Riches, CNMT   Rest Procedure:  Myocardial perfusion imaging was performed at rest 45 minutes following the intravenous administration of Technetium 85m Sestamibi. Stress Procedure:  The patient performed treadmill exercise using a Bruce  Protocol for 7  minutes. The patient stopped due to SOB and Fatigue and denied any chest pain.  There were no significant ST-T wave changes.  Technetium 99m Sestamibi was injected at peak exercise and myocardial perfusion imaging was performed after a brief delay.  Transient Ischemic Dilatation (Normal <1.22):  1.07 Lung/Heart Ratio (Normal <0.45):  0.29 QGS EDV:  54 ml QGS ESV:  12 ml LV Ejection Fraction: 77%      Rest ECG: NSR with non-specific ST-T wave changes  Stress ECG: 1-1.5 mm horizontal ST depressionat peak exercise, mostly in the inferior leads, less significant in V5  QPS Raw Data Images:  Normal; no motion artifact; normal heart/lung ratio. Stress Images:  Normal homogeneous uptake in all areas of the myocardium. Rest Images:  Normal homogeneous uptake in all areas of the myocardium. Subtraction (SDS):  No evidence of ischemia.  Impression Exercise Capacity:  Fair exercise capacity. BP Response:  Normal blood pressure response. Clinical Symptoms:  The exercise was limited by dyspnea and fatigue. ECG Impression:  Significant ST abnormalities consistent with ischemia. Comparison with Prior Nuclear Study: No previous nuclear study performed  Overall Impression:  Normal stress nuclear study. Abnormal ECG stress study, probably a "false positive" study.  LV Wall Motion:  NL LV Function; NL Wall Motion   Sanda Klein, MD  12/02/2013 6:31 PM

## 2013-12-02 NOTE — ED Notes (Addendum)
Pt has multiple complaints, states she has tremors x 1.5 weeks(no seen in triage), states her limbs are hurts, pain in hands, states pain goes down in big toe. Pt in no acute distress. Pt was [place], sent home dx w/ anxiety. Pt is continually talking nonstop in triage.

## 2013-12-02 NOTE — Discharge Instructions (Signed)
Your recent lab work appears normal. Currently, we see no tremors, and do not find any acute/emergent neurological findings on your exam.  We discussed your case with your primary care doctor, and she indicates for you to follow up with her closely in the office - she will help facilitate your appropriate follow up and care. For anxiety, follow up with your mental health provider in the next 1-2 days - call office today to arrange appointment.    Generalized Anxiety Disorder Generalized anxiety disorder (GAD) is a mental disorder. It interferes with life functions, including relationships, work, and school. GAD is different from normal anxiety, which everyone experiences at some point in their lives in response to specific life events and activities. Normal anxiety actually helps [place] prepare for and get through these life events and activities. Normal anxiety goes away after the event or activity is over.  GAD causes anxiety that is not necessarily related to specific events or activities. It also causes excess anxiety in proportion to specific events or activities. The anxiety associated with GAD is also difficult to control. GAD can vary from mild to severe. People with severe GAD can have intense waves of anxiety with physical symptoms (panic attacks).  SYMPTOMS The anxiety and worry associated with GAD are difficult to control. This anxiety and worry are related to many life events and activities and also occur more days than not for 6 months or longer. People with GAD also have three or more of the following symptoms (one or more in children):  Restlessness.   Fatigue.  Difficulty concentrating.   Irritability.  Muscle tension.  Difficulty sleeping or unsatisfying sleep. DIAGNOSIS GAD is diagnosed through an assessment by your caregiver. Your caregiver will ask you questions aboutyour mood,physical symptoms, and events in your life. Your caregiver may ask you about your medical  history and use of alcohol or drugs, including prescription medications. Your caregiver may also do a physical exam and blood tests. Certain medical conditions and the use of certain substances can cause symptoms similar to those associated with GAD. Your caregiver may refer you to a mental health specialist for further evaluation. TREATMENT The following therapies are usually used to treat GAD:   Medication Antidepressant medication usually is prescribed for long-term daily control. Antianxiety medications may be added in severe cases, especially when panic attacks occur.   Talk therapy (psychotherapy) Certain types of talk therapy can be helpful in treating GAD by providing support, education, and guidance. A form of talk therapy called cognitive behavioral therapy can teach you healthy ways to think about and react to daily life events and activities.  Stress managementtechniques These include yoga, meditation, and exercise and can be very helpful when they are practiced regularly. A mental health specialist can help determine which treatment is best for you. Some people see improvement with one therapy. However, other people require a combination of therapies. Document Released: 11/25/2012 Document Reviewed: 11/25/2012 [facility] Patient Information 2014 [place], [place].    Somatization Disorder Somatization disorder is a type of somatoform disorder. It occurs when a person feels pain and other symptoms that cannot be explained by medical findings. It is diagnosed after many medical tests and exams. The pain, physical symptoms, or emotional symptoms are real. They are not faked or created. Often, people with somatization disorder are worried about their symptoms. Along with a medical caregiver, a mental health caregiver can help to diagnose somatization disorder and teach coping skills. The disorder can last for several years and  may be intermittent. CAUSES  It is unclear what causes  somatization disorder. It may be related to:  Abnormal nerve impulses.  Psychological stress.  Family history.  Chronic pain.  History of sexual or physical abuse. The disorder usually begins before age 42. It occurs more often in women than men. SYMPTOMS There are many symptoms that can accompany somatization disorder. Usually, several mild to moderate symptoms appear. A person with somatization disorder seeks many medical visits to try to determine the cause of symptoms. DIAGNOSIS Diagnostic testing will vary depending on the specific symptoms involved. A person with this disorder will often have many medical tests and exams to rule out serious physical health problems. Evaluation may include:  Physical exam.  Screening health questionnaire.  Medical tests, such as blood tests, urine tests, X-rays, or other imaging tests. TREATMENT There is no cure for somatization disorder, but the symptoms can be managed and sometimes prevented. Treatment aims at teaching coping skills, reducing stress, and preventing new episodes. Once somatization disorder is diagnosed, treatment may include:  Regular monitoring and consultation with a dedicated caregiver for evaluation and coping skills.  Regular monitoring and therapy with a mental health provider to increase understanding of triggers and coping skills. Therapies may include:  Talk therapy.  Cognitive behavioral therapy.  Antidepressant medicines. It can be helpful for a primary caregiver and mental health provider to work together to develop a treatment plan. HOME CARE INSTRUCTIONS  Follow up with your primary caregiver or mental health provider as directed.  Take medicines as directed.  Try to reduce stress.  Exercise regularly. SEEK MEDICAL CARE IF:  New symptoms appear.  Pain or symptoms do not go away or become severe. SEEK IMMEDIATE MEDICAL CARE IF: You feel that your life or health are in danger. MAKE SURE  YOU:  Understand these instructions.  Will watch your condition.  Will get help right away if you are not doing well or get worse. Document Released: 09/02/2010 Document Revised: 10/23/2011 Document Reviewed: 09/02/2010 [facility] Patient Information 2014 [place], [place].

## 2013-12-03 ENCOUNTER — Encounter (INDEPENDENT_AMBULATORY_CARE_PROVIDER_SITE_OTHER): Payer: Medicare Other

## 2013-12-03 DIAGNOSIS — R002 Palpitations: Secondary | ICD-10-CM | POA: Diagnosis not present

## 2013-12-04 ENCOUNTER — Encounter: Payer: Self-pay | Admitting: Family Medicine

## 2013-12-04 ENCOUNTER — Telehealth: Payer: Self-pay | Admitting: General Surgery

## 2013-12-04 ENCOUNTER — Ambulatory Visit (INDEPENDENT_AMBULATORY_CARE_PROVIDER_SITE_OTHER): Payer: Medicare Other | Admitting: Family Medicine

## 2013-12-04 VITALS — BP 150/91 | HR 104 | Wt 184.0 lb

## 2013-12-04 DIAGNOSIS — R0982 Postnasal drip: Secondary | ICD-10-CM | POA: Diagnosis not present

## 2013-12-04 DIAGNOSIS — R0789 Other chest pain: Secondary | ICD-10-CM

## 2013-12-04 DIAGNOSIS — R251 Tremor, unspecified: Secondary | ICD-10-CM

## 2013-12-04 DIAGNOSIS — R259 Unspecified abnormal involuntary movements: Secondary | ICD-10-CM | POA: Diagnosis not present

## 2013-12-04 DIAGNOSIS — I10 Essential (primary) hypertension: Secondary | ICD-10-CM

## 2013-12-04 DIAGNOSIS — R0602 Shortness of breath: Secondary | ICD-10-CM

## 2013-12-04 MED ORDER — ALPRAZOLAM 0.25 MG PO TABS: 0.1250 mg | ORAL_TABLET | Freq: Two times a day (BID) | ORAL | Status: DC | PRN

## 2013-12-04 NOTE — Telephone Encounter (Signed)
Patient is low risk from a cardiac standpoint for surgery.  Her stress myoview showed no ischemia

## 2013-12-04 NOTE — Telephone Encounter (Signed)
Message copied by [name] on Thu Dec 04, 2013  1:56 PM ------      Message from: Golden Hurter D      Created: Thu Dec 04, 2013 10:51 AM       I called patient with her myoview results.She needs surgical clearance sent to Dr.Dasher in [facility].  Jennifer [name] ------

## 2013-12-04 NOTE — Telephone Encounter (Signed)
Sent letter of clearance over for pt

## 2013-12-04 NOTE — Telephone Encounter (Signed)
To Dr Radford Pax to ok clearance for surgery

## 2013-12-04 NOTE — Progress Notes (Signed)
Subjective:    Patient ID: Jennifer [name], female    DOB: 1966/04/13, 48 y.o.   MRN: [id number]  HPI  Hypertension-She tried to get the Tekturna at the pharmacy. Told it would require a PA.  Not taking anything right now. Has still been holding out on taking the metoprolol bc afraid it will cause chest tightness again.    Says has been hurting all over more and has had more tremors. Says can feel them come throughout the entire body.  Will start b12 injections next week with Neurology.  Hoping this helps. They have schedule another EEG.     She is using loop recorder.  Had an episdoes of tachycardia and broke out in sweats and she felt really light headed and then felt really bad.  She is worried about this and went to ED again a few days ago.    She does still occasionally spent shortness of breath but brought in her old spirometry report which was essentially normal. She also found out this morning that her stress test results were negative. Congratulated her on the good news.  She's also had intermittent sore throat with excessive postnasal drip on and off for months. Occasionally she'll notice that her lymph nodes will swell and her neck will feel sore at times. Today she feels a knot in the right side of her neck and says it's very tender and painful. No fevers chills or sweats. She has had some mild congestion. She has been using her nasal steroid spray and stopped using her saline rinse. Review of Systems     Objective:   Physical Exam  Constitutional: She is oriented to person, place, and time. She appears well-developed and well-nourished.  HENT:  Head: Normocephalic and atraumatic.  Right Ear: External ear normal.  Left Ear: External ear normal.  Nose: Nose normal.  Mouth/Throat: Oropharynx is clear and moist.  The nares are still quite erythematous. But the turbinates look less swollen.  Eyes: Conjunctivae and EOM are normal. Pupils are equal, round, and reactive to light.   Neck: Neck supple. No thyromegaly present.  Cardiovascular: Normal rate, regular rhythm and normal heart sounds.   Pulmonary/Chest: Effort normal and breath sounds normal. She has no wheezes.  Lymphadenopathy:    She has no cervical adenopathy.  Neurological: She is alert and oriented to person, place, and time.  Skin: Skin is warm and dry.  Psychiatric: She has a normal mood and affect. Her behavior is normal.          Assessment & Plan:  HTN - Will initiate a PA for her Teckturna.  She was unable to take one.  Hopefully can push PA through and get started on it.    Tremor - hopefully the b12 will help. Had serum copper, zinc checked.  Still unclear is this is truly seizure related. May be related to anxiety.   She has had more frequent sxs of dizziness and light headedness.  She really wonders if could be seizure sof hte temporal lobe as possibley suggessted by her psychaitris. Has had multiple EEG that show show waves but not proof of seizure.  She brought in several old EEG results mentioning slow sharp waves but no sign of actual epilepsy. She was even on a trial of an antiepileptic as a teenager, Dilantin. She really doesn't remember if it was helpful or not.  Tachycardia- She has loops recorder. Will see if sxs are actually an arrythmia or just tachycardia.  Explained in the past has just had tachycardia. Explained this is benign but in some people but they can feel it and it feels very uncomfortable. I still think is an element of anxiety that is driving this. Suspecting that she's actually having tachycardic/palpitations episodes which has been making her feel dizzy and lightheaded because she's getting anxious and worked up about it. I think there is an element of those going on here. We discussed this today.  Post nasal drip-explained this can be from a lot of different factors. She wonders if she could still have a low-lying sinus infection. We could certainly get an x-ray of  the sinuses to evaluate further for this. We'll call her with the results. I think it's unlikely. Continue with nasal steroid spray and hold off on saline rinses which I think have actually been a little bit more irritating to her nasal mucosa and helpful. She is Re: seeing ENT multiple times and follows with an allergist regularly. I reassured her that a knot that she's feeling in her neck is a very small lymph node. In fact it is not appear to be swollen significantly more inflamed or hard which are all reassuring signs it's a normal inflammatory reaction.  Time spent 45 minutes, greater than 50% time spent counseling about her blood pressure, tachycardia, dizziness, tremor.

## 2013-12-05 ENCOUNTER — Other Ambulatory Visit: Payer: Self-pay | Admitting: *Deleted

## 2013-12-05 ENCOUNTER — Encounter: Payer: Self-pay | Admitting: Family Medicine

## 2013-12-05 DIAGNOSIS — J329 Chronic sinusitis, unspecified: Secondary | ICD-10-CM | POA: Diagnosis not present

## 2013-12-06 IMAGING — US US EXTREM LOW VENOUS*R*
1 series · 14 of 24 positions shown · non-contrast
Comparison: 10/23/2011

CLINICAL DATA: Right leg pain and edema

RIGHT LOWER EXTREMITY VENOUS DUPLEX ULTRASOUND
TECHNIQUE: Gray-scale sonography with graded compression, as well
as color Doppler and duplex ultrasound were performed to evaluate
the deep venous system of the lower extremity from the level of the
common femoral vein through the popliteal and proximal calf veins.
Spectral Doppler was utilized to evaluate flow at rest and with
distal augmentation maneuvers.

[Series 1: us extrem low venous*right* · 14 of 24 slices shown]
[im 1/24]
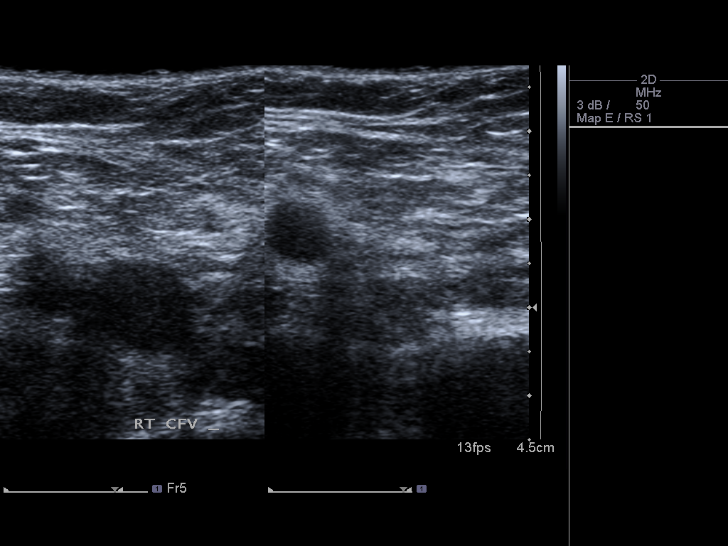
[im 3/24]
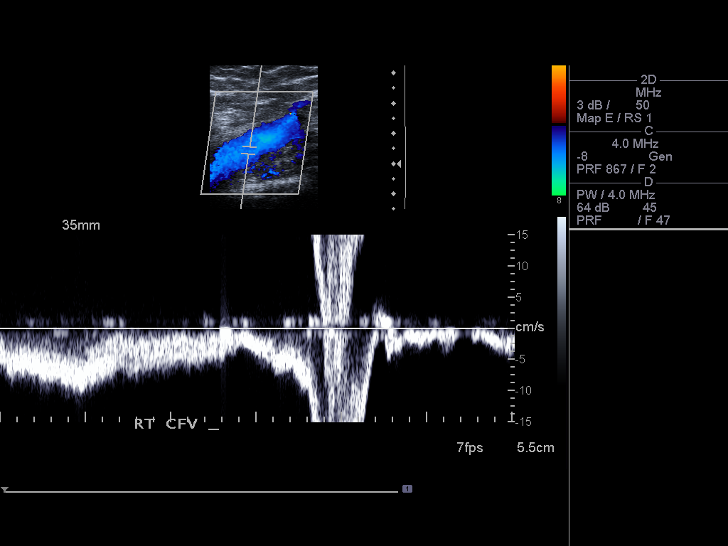
[im 5/24]
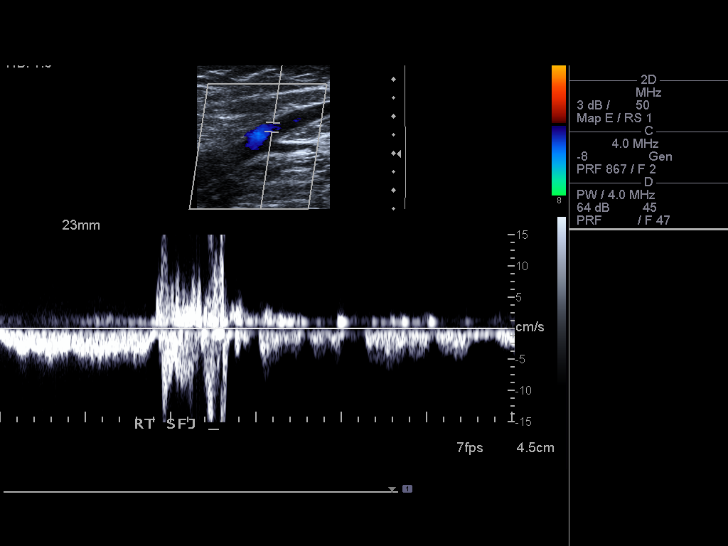
[im 7/24]
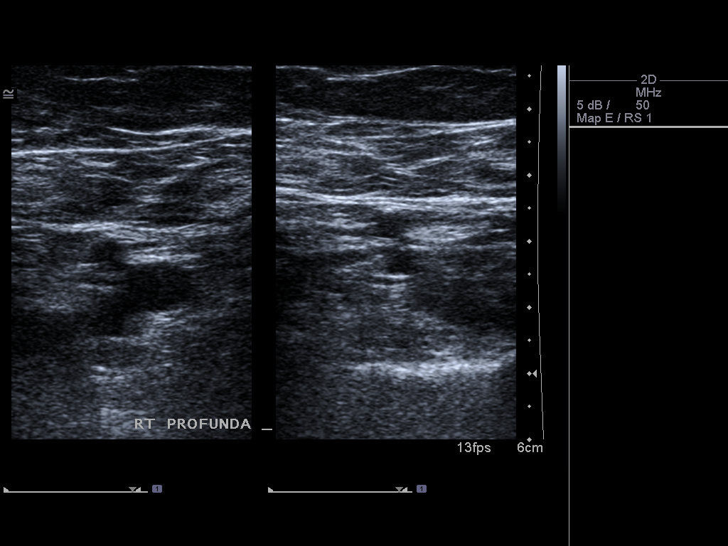
[im 8/24]
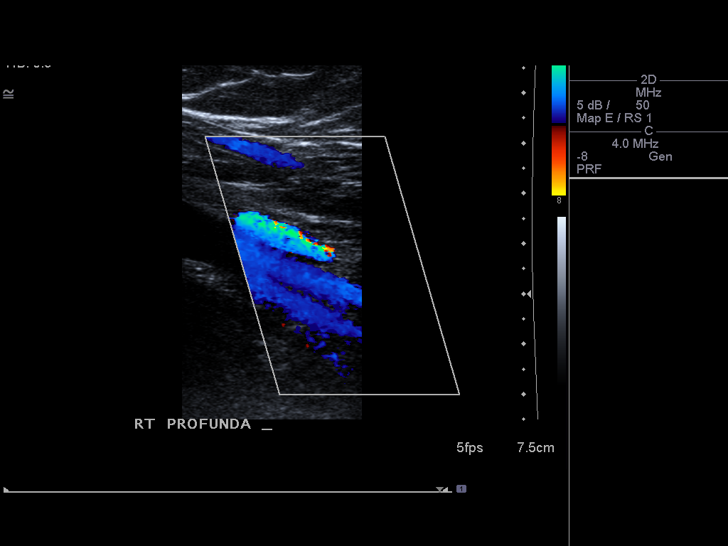
[im 10/24]
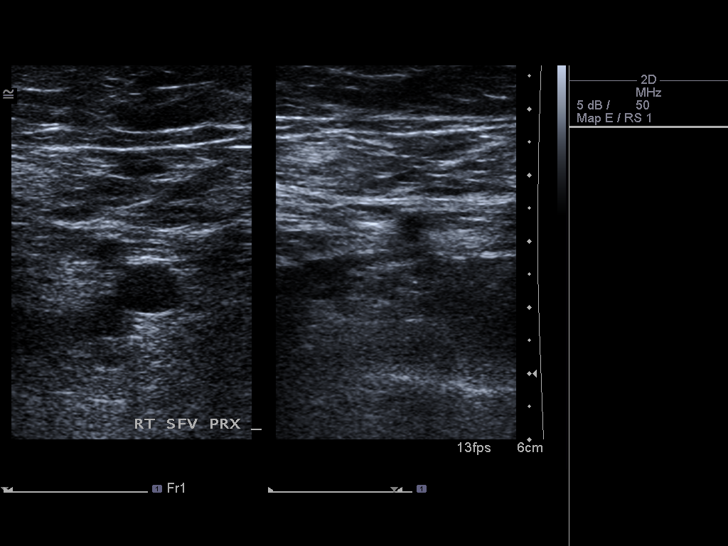
[im 12/24]
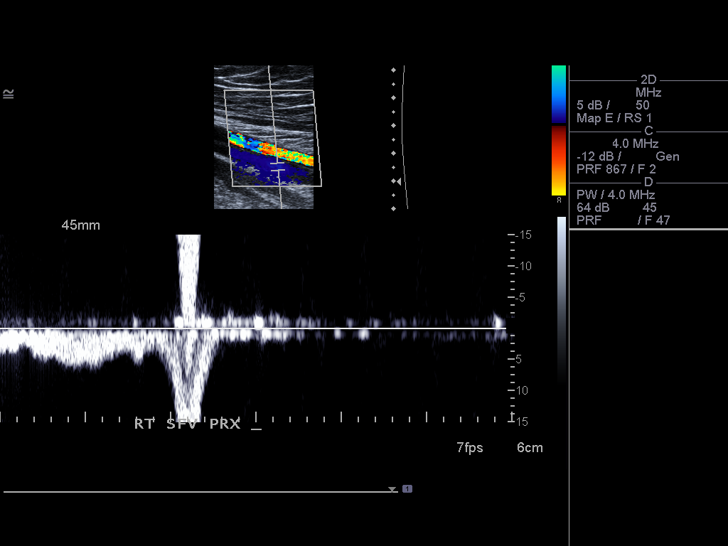
[im 13/24]
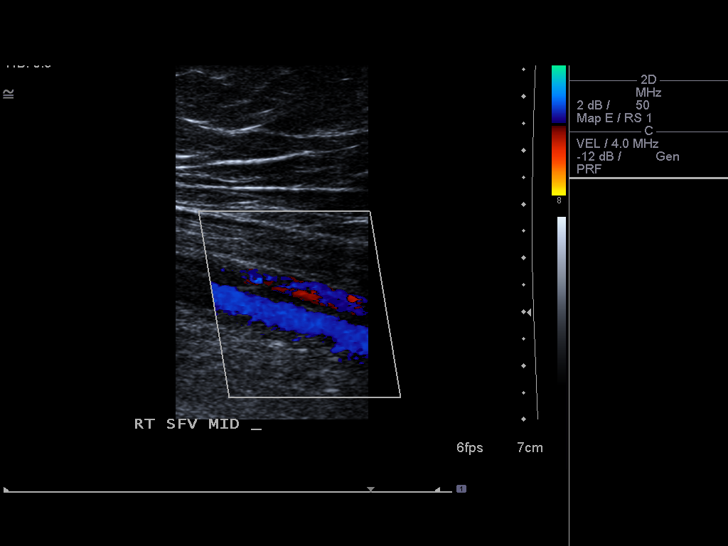
[im 15/24]
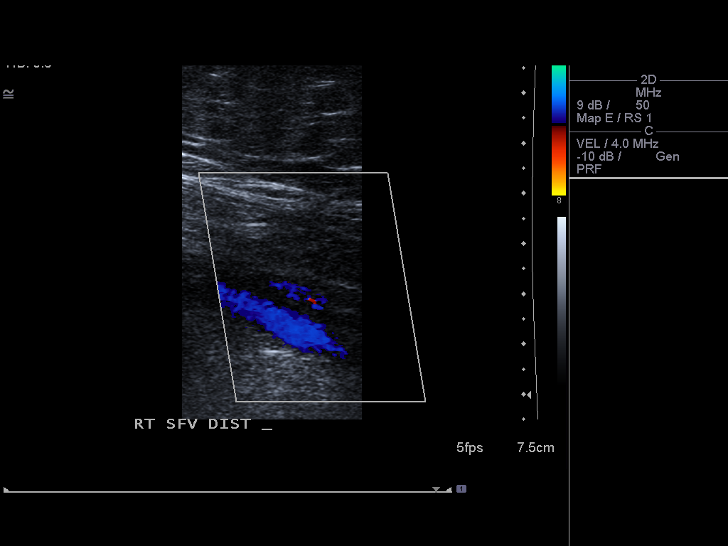
[im 17/24]
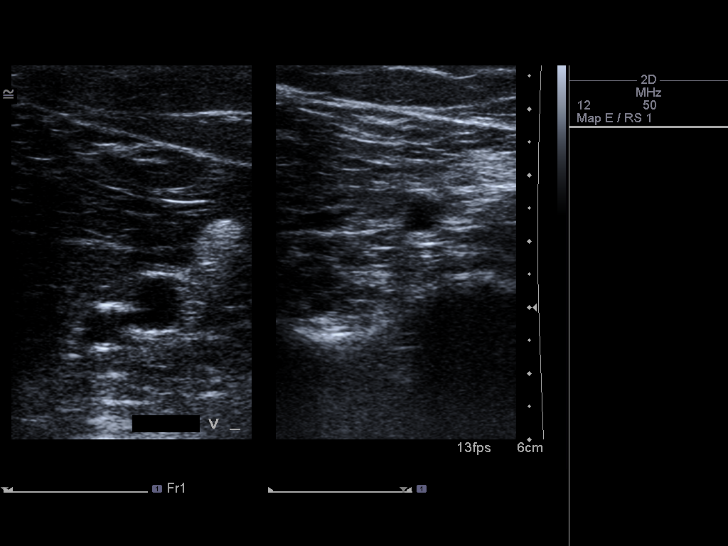
[im 19/24]
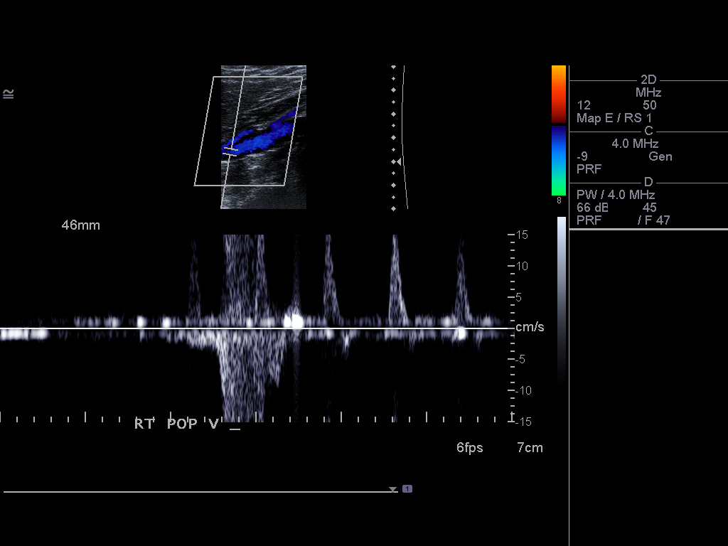
[im 20/24]
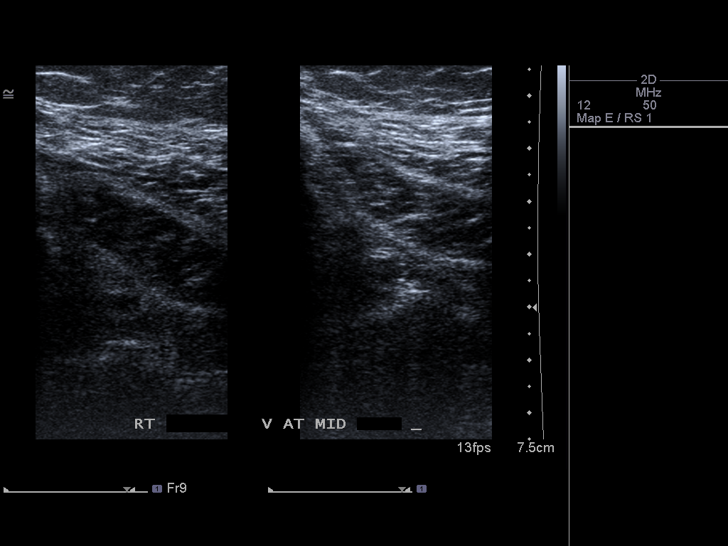
[im 22/24]
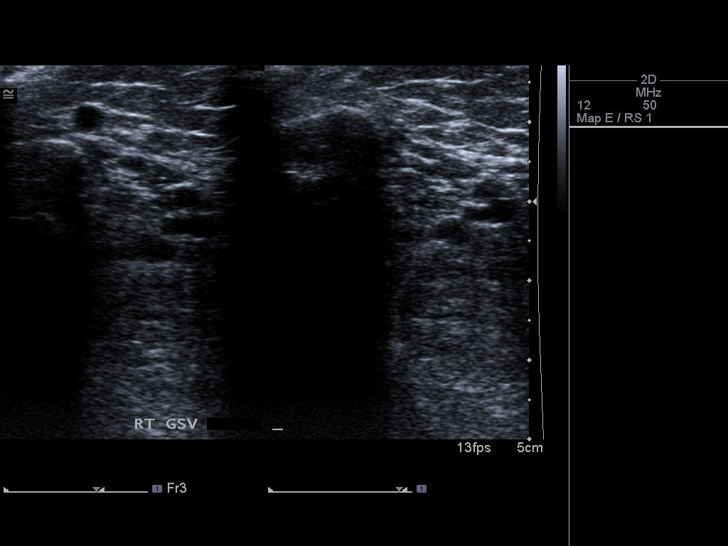
[im 24/24]
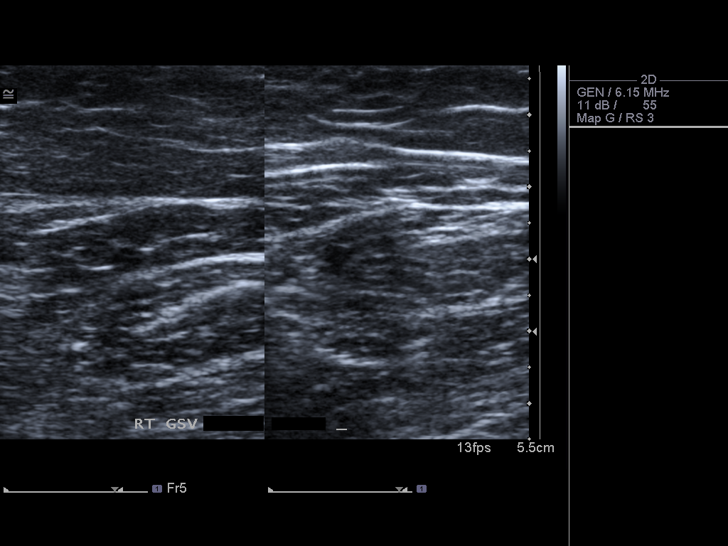

[14 of 24 positions shown; findings below may reference images not displayed]

FINDINGS: Normal compressibility of the common femoral,
superficial femoral, and popliteal veins is demonstrated, as well
as the visualized proximal calf veins.  No filling defects to
suggest DVT on grayscale or color Doppler imaging.  Doppler
waveforms show normal direction of venous flow, normal respiratory
phasicity and response to augmentation.
IMPRESSION: No evidence of right leg deep venous thrombosis.

## 2013-12-08 ENCOUNTER — Telehealth: Payer: Self-pay | Admitting: Family Medicine

## 2013-12-08 DIAGNOSIS — R569 Unspecified convulsions: Secondary | ICD-10-CM | POA: Diagnosis not present

## 2013-12-08 DIAGNOSIS — R5381 Other malaise: Secondary | ICD-10-CM | POA: Diagnosis not present

## 2013-12-08 DIAGNOSIS — R55 Syncope and collapse: Secondary | ICD-10-CM | POA: Diagnosis not present

## 2013-12-08 DIAGNOSIS — G609 Hereditary and idiopathic neuropathy, unspecified: Secondary | ICD-10-CM | POA: Diagnosis not present

## 2013-12-08 DIAGNOSIS — E261 Secondary hyperaldosteronism: Secondary | ICD-10-CM | POA: Diagnosis not present

## 2013-12-08 DIAGNOSIS — R5383 Other fatigue: Secondary | ICD-10-CM | POA: Diagnosis not present

## 2013-12-08 DIAGNOSIS — F319 Bipolar disorder, unspecified: Secondary | ICD-10-CM | POA: Diagnosis not present

## 2013-12-08 NOTE — Telephone Encounter (Signed)
Call pt:  I did get the report back on your sinus x-rays.. Your sinuses were completely clear. No air-fluid levels to suggest infection. No bony distraction. And the nasal septum was in the midline. Everything was normal. This does not mean you can't have drainage and congestion, but does rule out infection. I have received your sleep study report as well but have not had a chance to review it yet. We will call you with that next week.

## 2013-12-08 NOTE — Telephone Encounter (Signed)
Pt informed and stated that dr.metheney should receive information from dr. Berdine Addison.Teddy Spike

## 2013-12-09 ENCOUNTER — Telehealth: Payer: Self-pay | Admitting: Family Medicine

## 2013-12-09 DIAGNOSIS — D649 Anemia, unspecified: Secondary | ICD-10-CM | POA: Diagnosis not present

## 2013-12-09 DIAGNOSIS — I10 Essential (primary) hypertension: Secondary | ICD-10-CM | POA: Diagnosis not present

## 2013-12-09 DIAGNOSIS — R439 Unspecified disturbances of smell and taste: Secondary | ICD-10-CM | POA: Diagnosis not present

## 2013-12-09 DIAGNOSIS — Z888 Allergy status to other drugs, medicaments and biological substances status: Secondary | ICD-10-CM | POA: Diagnosis not present

## 2013-12-09 DIAGNOSIS — Z79899 Other long term (current) drug therapy: Secondary | ICD-10-CM | POA: Diagnosis not present

## 2013-12-09 DIAGNOSIS — K148 Other diseases of tongue: Secondary | ICD-10-CM | POA: Diagnosis not present

## 2013-12-10 ENCOUNTER — Telehealth: Payer: Self-pay | Admitting: *Deleted

## 2013-12-10 DIAGNOSIS — R5381 Other malaise: Secondary | ICD-10-CM | POA: Diagnosis not present

## 2013-12-10 DIAGNOSIS — M79609 Pain in unspecified limb: Secondary | ICD-10-CM | POA: Diagnosis not present

## 2013-12-10 DIAGNOSIS — G501 Atypical facial pain: Secondary | ICD-10-CM | POA: Diagnosis not present

## 2013-12-10 DIAGNOSIS — R5383 Other fatigue: Secondary | ICD-10-CM | POA: Diagnosis not present

## 2013-12-10 DIAGNOSIS — D649 Anemia, unspecified: Secondary | ICD-10-CM | POA: Diagnosis not present

## 2013-12-10 DIAGNOSIS — R569 Unspecified convulsions: Secondary | ICD-10-CM | POA: Diagnosis not present

## 2013-12-10 DIAGNOSIS — F339 Major depressive disorder, recurrent, unspecified: Secondary | ICD-10-CM | POA: Diagnosis not present

## 2013-12-10 DIAGNOSIS — R55 Syncope and collapse: Secondary | ICD-10-CM | POA: Diagnosis not present

## 2013-12-10 NOTE — Telephone Encounter (Signed)
Prior auth obtained for Tekturna 150mg  tablets quantity 30 for 30 days.  Approved from 12/08/13 to 12/09/14. Clemetine Marker, LPN

## 2013-12-11 ENCOUNTER — Encounter: Payer: Self-pay | Admitting: Family Medicine

## 2013-12-11 ENCOUNTER — Ambulatory Visit (INDEPENDENT_AMBULATORY_CARE_PROVIDER_SITE_OTHER): Payer: Medicare Other | Admitting: Family Medicine

## 2013-12-11 ENCOUNTER — Encounter (HOSPITAL_COMMUNITY): Payer: Medicare Other

## 2013-12-11 VITALS — BP 142/97 | HR 113 | Wt 184.0 lb

## 2013-12-11 DIAGNOSIS — M25519 Pain in unspecified shoulder: Secondary | ICD-10-CM | POA: Diagnosis not present

## 2013-12-11 DIAGNOSIS — I10 Essential (primary) hypertension: Secondary | ICD-10-CM

## 2013-12-11 DIAGNOSIS — M6520 Calcific tendinitis, unspecified site: Secondary | ICD-10-CM

## 2013-12-11 DIAGNOSIS — R591 Generalized enlarged lymph nodes: Secondary | ICD-10-CM

## 2013-12-11 DIAGNOSIS — G40909 Epilepsy, unspecified, not intractable, without status epilepticus: Secondary | ICD-10-CM

## 2013-12-11 DIAGNOSIS — M25512 Pain in left shoulder: Secondary | ICD-10-CM

## 2013-12-11 DIAGNOSIS — R569 Unspecified convulsions: Secondary | ICD-10-CM | POA: Insufficient documentation

## 2013-12-11 DIAGNOSIS — R599 Enlarged lymph nodes, unspecified: Secondary | ICD-10-CM

## 2013-12-11 NOTE — Progress Notes (Signed)
Subjective:    Patient ID: Jennifer [name], female    DOB: 11/09/1965, 48 y.o.   MRN: [id number]  HPI Dx with seizure by her new rheumatologist Dr. Lawrence Marseilles.  Given rx for lamotrigine. She did fill the prescription but has yet to start it. She's extremely nervous about the black box warning about rashes and [name] syndrome. She has not started the tech turn for her blood pressure either because of a similar black box warning. Her husband is not home very often she is worried about starting the medication being home alone. 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  Left shoulder pain - Says getting worse. Was doing some overhead lifting at the gym and started to hurt again.  Says now painful at 30 degrees.  she actually seen Dr. Dianah Field, or sports medicine doctor last June for this issue and he diagnosed her with calcific tendinitis. He recommended aspiration and injection at that time but she declined and gradually got a little better on her own.  He still having some drainage in her throat and some soreness on the right side of her neck or the lymph node. No fevers chills or sweats. She is able to swallow. Review of Systems     Objective:   Physical Exam  Constitutional: She is oriented to person, place, and time. She appears well-developed and well-nourished.  HENT:  Head: Normocephalic and atraumatic.  Eyes: Conjunctivae and EOM are normal. Pupils are equal, round, and reactive to light.  Neck: Neck supple. No thyromegaly present.  Small ant cerv ln bilat. Mildly tender.   Cardiovascular: Normal rate, regular rhythm and normal heart sounds.   Pulmonary/Chest: Effort normal and breath sounds normal.  Musculoskeletal:  Left shoulder is  tender laterally. She is able to raise the arm to about 100 it starts to have significant pain at 30.  Lymphadenopathy:    She has cervical adenopathy.  Neurological: She is alert and oriented to person, place, and time.  Skin: Skin is warm and dry.  Psychiatric: She has a normal mood and affect. Her behavior is normal.          Assessment & Plan:   Seizure D/O - we discussed the importance of at least trying the medication. If she starts to notice any type of rash she stopped the medication immediately and should seek medical care. She is also welcome to come to our office on Thursdays to sit in the room after taking the medication so that she can be somewhat monitored and near medical care. She also has lumbar puncture scheduled in a couple weeks to rule out other potential causes of her symptoms including weakness, gait changes, and some mild memory loss.  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 I did speak with Dr. Dianah Field. He would be able to aspirate the calcification and inject lidocaine instead of prednisone. She does not have any prednisone injected. She says she has significant side effects on it. We'll contact her and let her know that she can schedule followup appointment with him for this.  Hypertension-still uncontrolled. Patient is going to hold onto Turner for now. She thinks she will try the Lamictal first.  Small lymphadenopathy in the neck-most likely secondary to external allergens. Allergy season is  high locally. Do not recommend antibiotics or further treatment this time.  Time spent 45 min, greater than 50% of time spent counseling about seizures, shoulder pain and blood pressure.

## 2013-12-12 ENCOUNTER — Emergency Department (HOSPITAL_BASED_OUTPATIENT_CLINIC_OR_DEPARTMENT_OTHER): Payer: Medicare Other

## 2013-12-12 ENCOUNTER — Emergency Department (HOSPITAL_BASED_OUTPATIENT_CLINIC_OR_DEPARTMENT_OTHER)
Admission: EM | Admit: 2013-12-12 | Discharge: 2013-12-12 | Disposition: A | Discharge status: Home / Self Care | Payer: Medicare Other | Attending: Emergency Medicine | Admitting: Emergency Medicine

## 2013-12-12 ENCOUNTER — Encounter (HOSPITAL_BASED_OUTPATIENT_CLINIC_OR_DEPARTMENT_OTHER): Payer: Self-pay | Admitting: Emergency Medicine

## 2013-12-12 DIAGNOSIS — Z862 Personal history of diseases of the blood and blood-forming organs and certain disorders involving the immune mechanism: Secondary | ICD-10-CM | POA: Insufficient documentation

## 2013-12-12 DIAGNOSIS — F40298 Other specified phobia: Secondary | ICD-10-CM | POA: Insufficient documentation

## 2013-12-12 DIAGNOSIS — I10 Essential (primary) hypertension: Secondary | ICD-10-CM | POA: Diagnosis not present

## 2013-12-12 DIAGNOSIS — Z8542 Personal history of malignant neoplasm of other parts of uterus: Secondary | ICD-10-CM | POA: Insufficient documentation

## 2013-12-12 DIAGNOSIS — Z87828 Personal history of other (healed) physical injury and trauma: Secondary | ICD-10-CM | POA: Insufficient documentation

## 2013-12-12 DIAGNOSIS — Z880 Allergy status to penicillin: Secondary | ICD-10-CM | POA: Diagnosis not present

## 2013-12-12 DIAGNOSIS — G8929 Other chronic pain: Secondary | ICD-10-CM | POA: Insufficient documentation

## 2013-12-12 DIAGNOSIS — J029 Acute pharyngitis, unspecified: Secondary | ICD-10-CM | POA: Insufficient documentation

## 2013-12-12 DIAGNOSIS — R059 Cough, unspecified: Secondary | ICD-10-CM | POA: Diagnosis not present

## 2013-12-12 DIAGNOSIS — R05 Cough: Secondary | ICD-10-CM | POA: Diagnosis not present

## 2013-12-12 DIAGNOSIS — G4730 Sleep apnea, unspecified: Secondary | ICD-10-CM | POA: Diagnosis not present

## 2013-12-12 DIAGNOSIS — IMO0002 Reserved for concepts with insufficient information to code with codable children: Secondary | ICD-10-CM | POA: Insufficient documentation

## 2013-12-12 DIAGNOSIS — E785 Hyperlipidemia, unspecified: Secondary | ICD-10-CM | POA: Diagnosis not present

## 2013-12-12 DIAGNOSIS — K219 Gastro-esophageal reflux disease without esophagitis: Secondary | ICD-10-CM | POA: Diagnosis not present

## 2013-12-12 DIAGNOSIS — J45909 Unspecified asthma, uncomplicated: Secondary | ICD-10-CM | POA: Diagnosis not present

## 2013-12-12 DIAGNOSIS — Z85828 Personal history of other malignant neoplasm of skin: Secondary | ICD-10-CM | POA: Diagnosis not present

## 2013-12-12 DIAGNOSIS — Z87891 Personal history of nicotine dependence: Secondary | ICD-10-CM | POA: Insufficient documentation

## 2013-12-12 DIAGNOSIS — Z8614 Personal history of Methicillin resistant Staphylococcus aureus infection: Secondary | ICD-10-CM | POA: Diagnosis not present

## 2013-12-12 DIAGNOSIS — Z9889 Other specified postprocedural states: Secondary | ICD-10-CM | POA: Diagnosis not present

## 2013-12-12 DIAGNOSIS — Z79899 Other long term (current) drug therapy: Secondary | ICD-10-CM | POA: Insufficient documentation

## 2013-12-12 MED ORDER — DEXAMETHASONE 6 MG PO TABS: 6.0000 mg | ORAL_TABLET | Freq: Every day | ORAL | Status: DC

## 2013-12-12 NOTE — Discharge Instructions (Signed)
Cough, Adult  A cough is a reflex that helps clear your throat and airways. It can help heal the body or may be a reaction to an irritated airway. A cough may only last 2 or 3 weeks (acute) or may last more than 8 weeks (chronic).  CAUSES Acute cough:  Viral or bacterial infections. Chronic cough:  Infections.  Allergies.  Asthma.  Post-nasal drip.  Smoking.  Heartburn or acid reflux.  Some medicines.  Chronic lung problems (COPD).  Cancer. SYMPTOMS   Cough.  Fever.  Chest pain.  Increased breathing rate.  High-pitched whistling sound when breathing (wheezing).  Colored mucus that you cough up (sputum). TREATMENT   A bacterial cough may be treated with antibiotic medicine.  A viral cough must run its course and will not respond to antibiotics.  Your caregiver may recommend other treatments if you have a chronic cough. HOME CARE INSTRUCTIONS   Only take over-the-counter or prescription medicines for pain, discomfort, or fever as directed by your caregiver. Use cough suppressants only as directed by your caregiver.  Use a cold steam vaporizer or humidifier in your bedroom or home to help loosen secretions.  Sleep in a semi-upright position if your cough is worse at night.  Rest as needed.  Stop smoking if you smoke. SEEK IMMEDIATE MEDICAL CARE IF:   You have pus in your sputum.  Your cough starts to worsen.  You cannot control your cough with suppressants and are losing sleep.  You begin coughing up blood.  You have difficulty breathing.  You develop pain which is getting worse or is uncontrolled with medicine.  You have a fever. MAKE SURE YOU:   Understand these instructions.  Will watch your condition.  Will get help right away if you are not doing well or get worse. Document Released: 01/27/2011 Document Revised: 10/23/2011 Document Reviewed: 01/27/2011 [employer] Patient Information 2014 [employer].  

## 2013-12-12 NOTE — ED Notes (Signed)
Pt amb to room 12 with quick steady gait in nad. Pt reports "months" of "glands swollen" and cough with sore throat.

## 2013-12-12 NOTE — ED Provider Notes (Signed)
Medical screening examination/treatment/procedure(s) were performed by non-physician practitioner and as supervising physician I was immediately available for consultation/collaboration.   EKG Interpretation None        Malvin Johns, MD 12/12/13 1428
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
## 2013-12-13 DIAGNOSIS — R490 Dysphonia: Secondary | ICD-10-CM | POA: Diagnosis not present

## 2013-12-13 DIAGNOSIS — R1310 Dysphagia, unspecified: Secondary | ICD-10-CM | POA: Diagnosis not present

## 2013-12-13 DIAGNOSIS — J029 Acute pharyngitis, unspecified: Secondary | ICD-10-CM | POA: Diagnosis not present

## 2013-12-13 IMAGING — CR DG CHEST 2V
2 series · 2 of 2 positions shown · non-contrast
Comparison: 08/27/2012

CLINICAL DATA: Shortness of breath, possible aspiration

CHEST - 2 VIEW

[w chest pa]
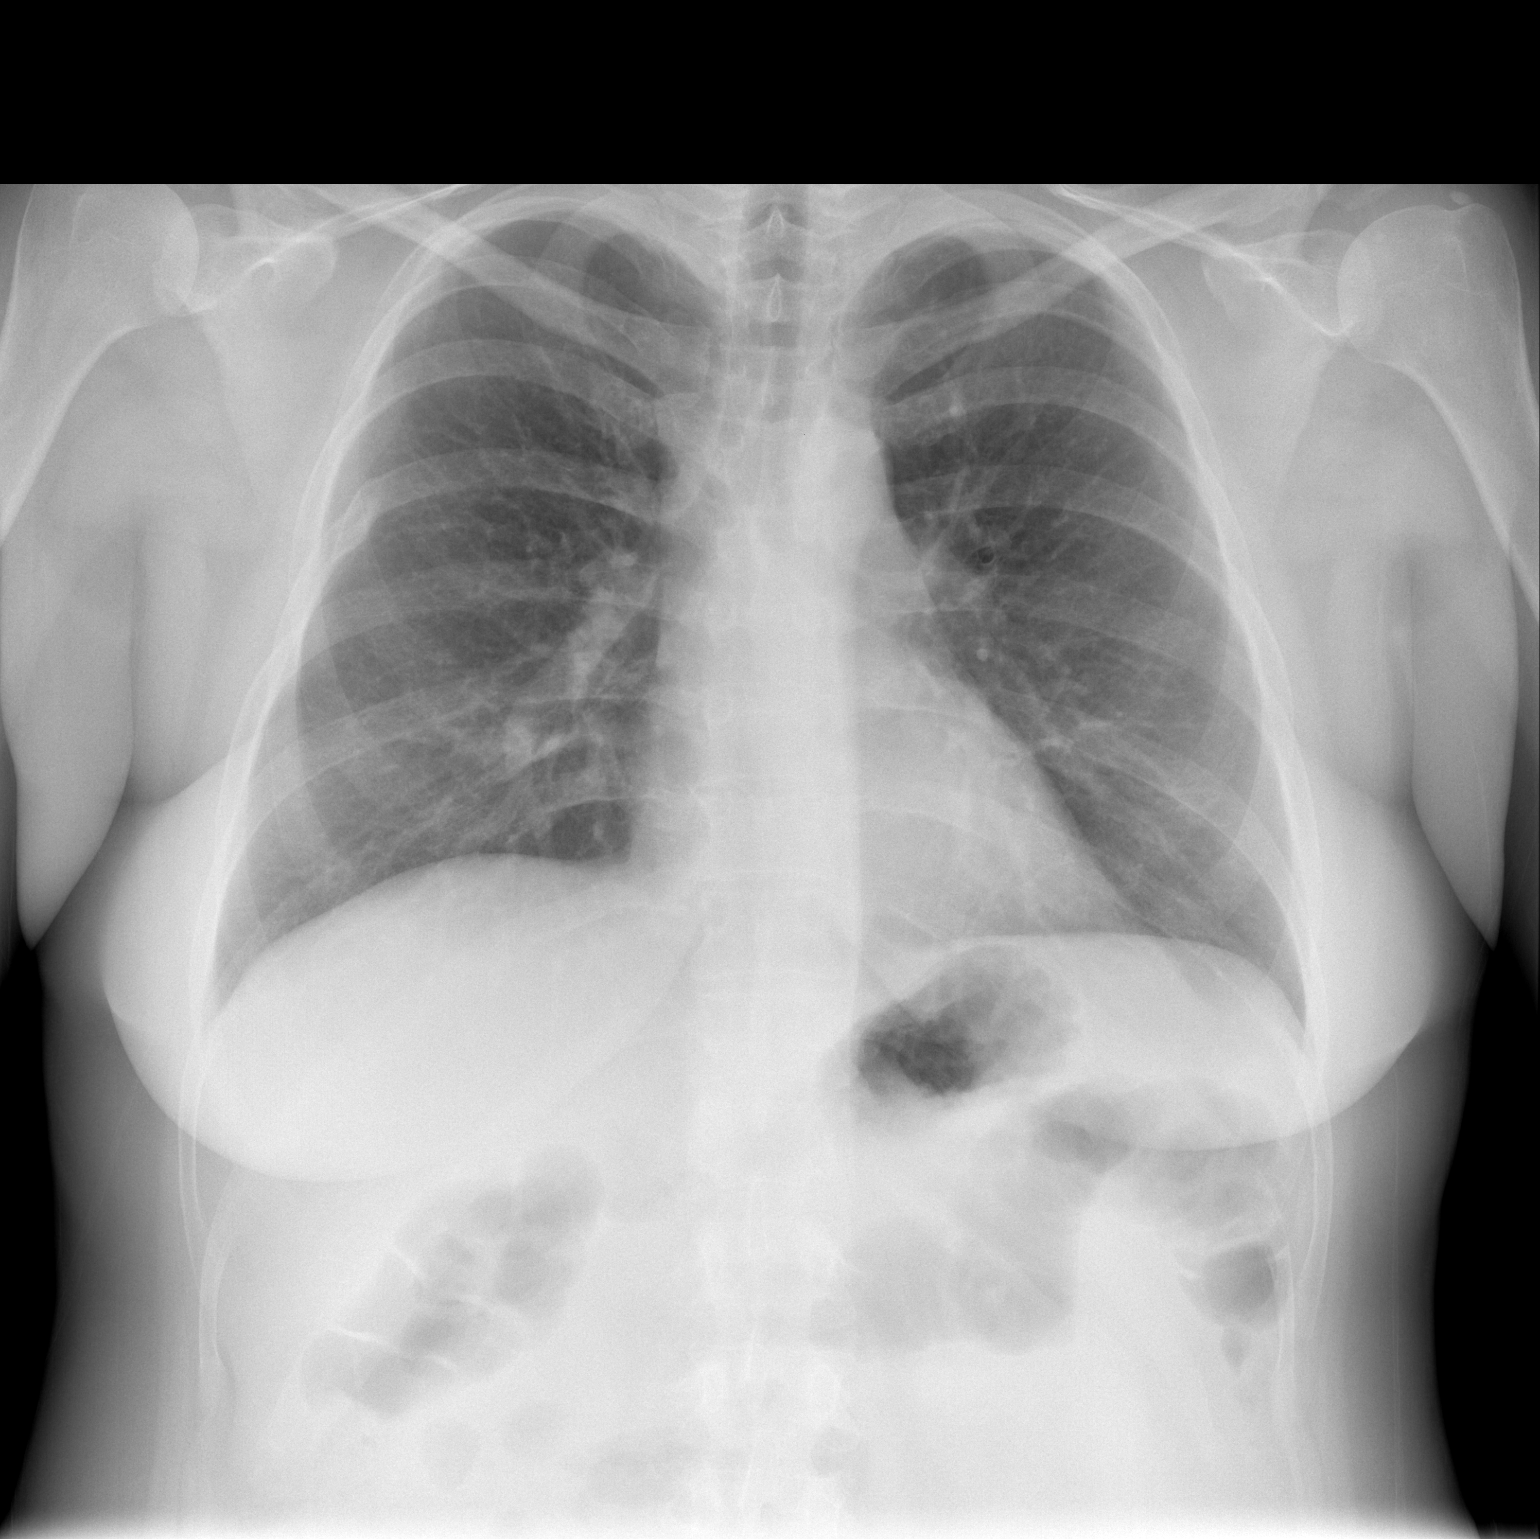

[w chest lat]
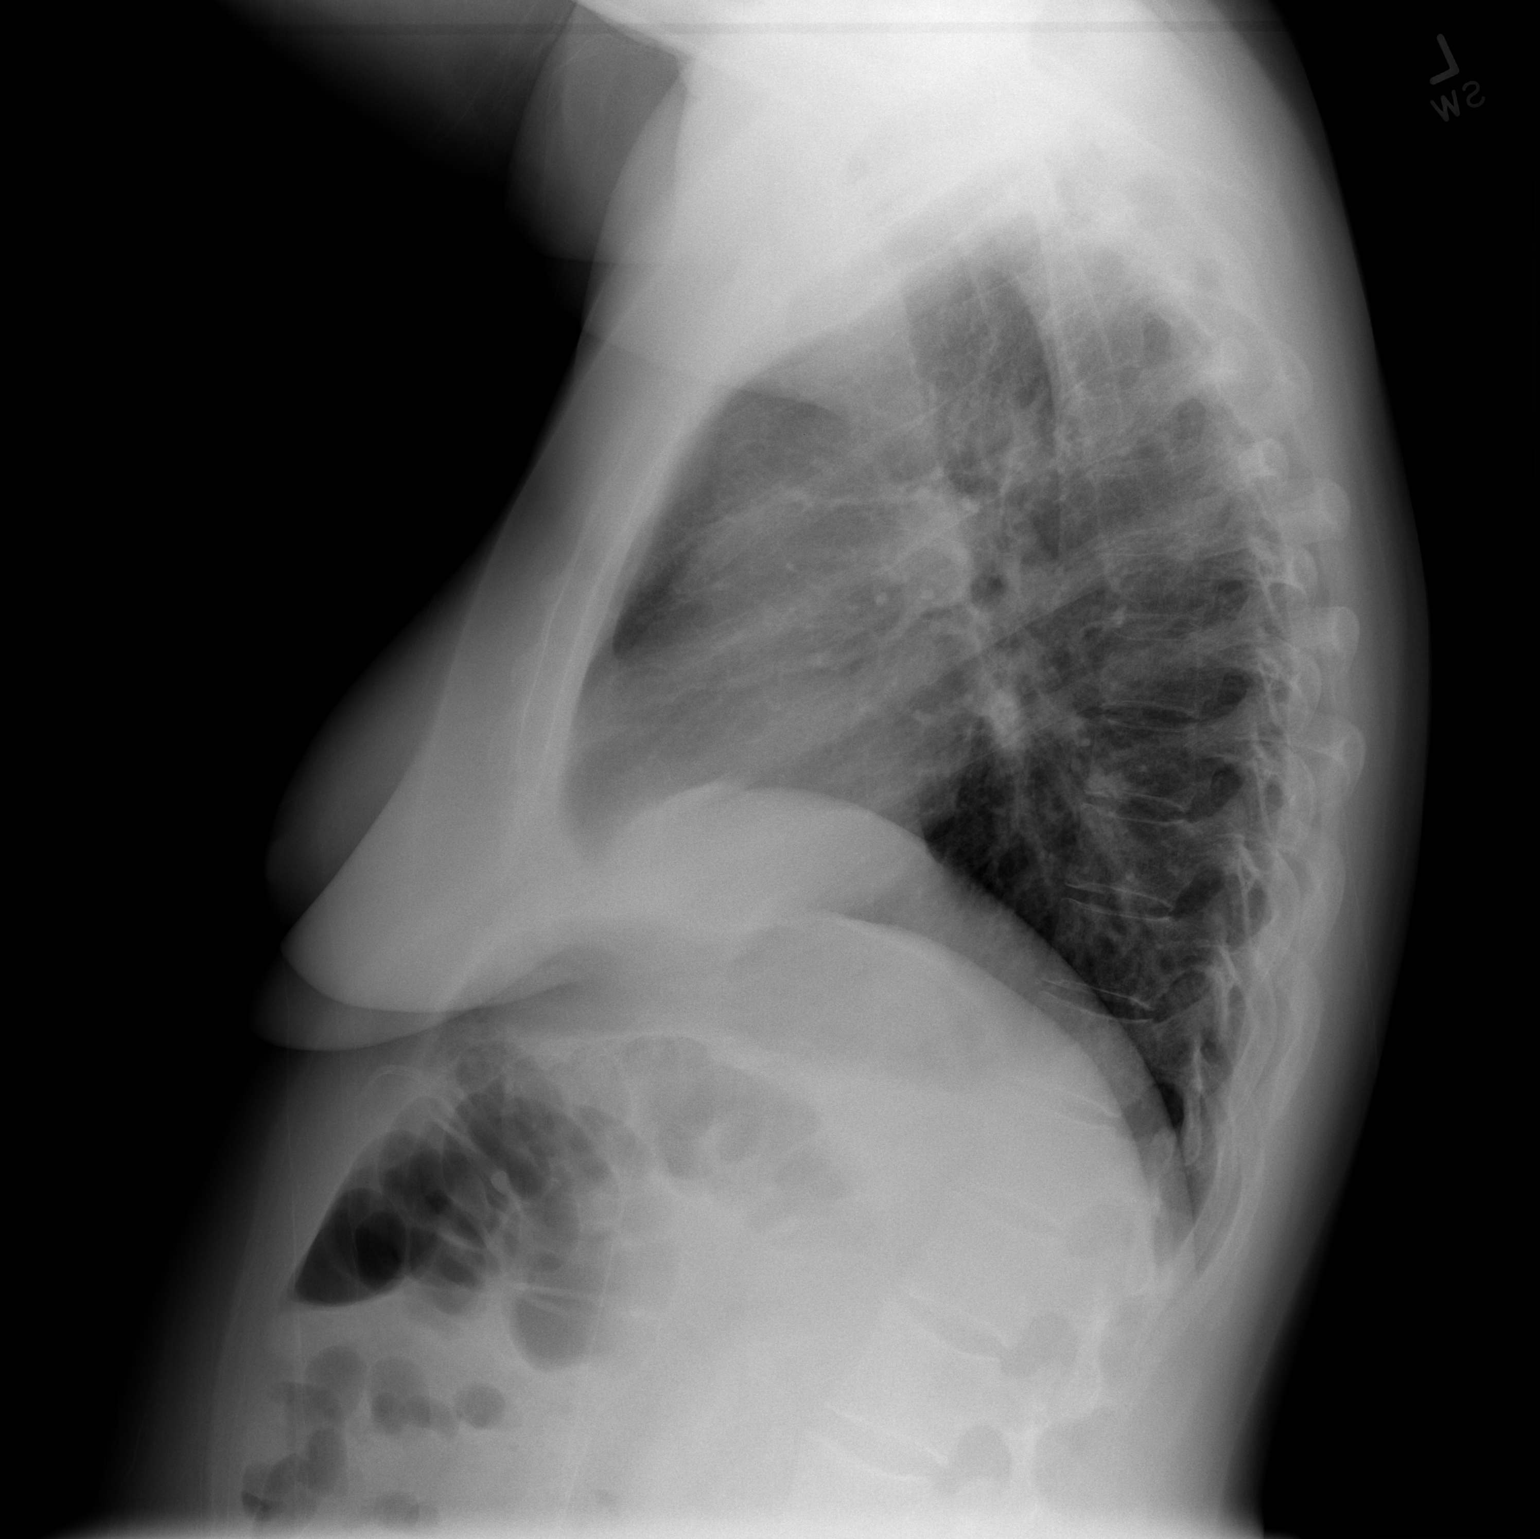

[2 of 2 positions shown; findings below may reference images not displayed]

FINDINGS: Grossly unchanged cardiac silhouette and mediastinal
contours.  No focal parenchymal opacity.  No pleural effusion or
pneumothorax.  Old minimally displaced right sided fifth rib
fracture.  No acute osseous abnormalities.
IMPRESSION: No acute cardiopulmonary disease.

## 2013-12-15 ENCOUNTER — Ambulatory Visit: Payer: Medicare Other | Admitting: Family Medicine

## 2013-12-15 DIAGNOSIS — D509 Iron deficiency anemia, unspecified: Secondary | ICD-10-CM | POA: Diagnosis not present

## 2013-12-15 NOTE — Telephone Encounter (Signed)
error 

## 2013-12-17 DIAGNOSIS — R093 Abnormal sputum: Secondary | ICD-10-CM | POA: Diagnosis not present

## 2013-12-17 DIAGNOSIS — R059 Cough, unspecified: Secondary | ICD-10-CM | POA: Diagnosis not present

## 2013-12-17 DIAGNOSIS — R599 Enlarged lymph nodes, unspecified: Secondary | ICD-10-CM | POA: Diagnosis not present

## 2013-12-17 DIAGNOSIS — R7989 Other specified abnormal findings of blood chemistry: Secondary | ICD-10-CM | POA: Diagnosis not present

## 2013-12-17 DIAGNOSIS — J029 Acute pharyngitis, unspecified: Secondary | ICD-10-CM | POA: Diagnosis not present

## 2013-12-17 DIAGNOSIS — IMO0001 Reserved for inherently not codable concepts without codable children: Secondary | ICD-10-CM | POA: Diagnosis not present

## 2013-12-17 DIAGNOSIS — G8929 Other chronic pain: Secondary | ICD-10-CM | POA: Diagnosis not present

## 2013-12-17 DIAGNOSIS — I10 Essential (primary) hypertension: Secondary | ICD-10-CM | POA: Diagnosis not present

## 2013-12-17 DIAGNOSIS — Z8673 Personal history of transient ischemic attack (TIA), and cerebral infarction without residual deficits: Secondary | ICD-10-CM | POA: Diagnosis not present

## 2013-12-17 DIAGNOSIS — F4310 Post-traumatic stress disorder, unspecified: Secondary | ICD-10-CM | POA: Diagnosis not present

## 2013-12-18 ENCOUNTER — Ambulatory Visit (INDEPENDENT_AMBULATORY_CARE_PROVIDER_SITE_OTHER): Payer: Medicare Other | Admitting: Family Medicine

## 2013-12-18 ENCOUNTER — Encounter: Payer: Self-pay | Admitting: Family Medicine

## 2013-12-18 VITALS — BP 155/99 | HR 117 | Wt 185.0 lb

## 2013-12-18 DIAGNOSIS — G4733 Obstructive sleep apnea (adult) (pediatric): Secondary | ICD-10-CM

## 2013-12-18 DIAGNOSIS — J019 Acute sinusitis, unspecified: Secondary | ICD-10-CM

## 2013-12-18 MED ORDER — CLINDAMYCIN HCL 150 MG PO CAPS: 150.0000 mg | ORAL_CAPSULE | Freq: Three times a day (TID) | ORAL | Status: DC

## 2013-12-18 NOTE — Progress Notes (Signed)
   Subjective:    Patient ID: Jennifer [name], female    DOB: [birth year], 48 y.o.   MRN: [id number]  HPI Has been coughing for one week and nasal congesition Some sputum but feels deeper in her chest, she points to the upper bronchus area.  Has noticed some swollen LN in her neck, esp on the left. Has been snoring and drooling. Has been having chills for at least 2 days and some fatigue. Hasn't been drinking or eating much.  Feels really achy all over.  +ST. Coughing to the point of gagging.  Feels like the glands in teh neck are swollen too.   Here to go over sleep study results as well. Was tested several years ago and told had mild OSA. She tried using CPAP in the past and didn't tolerate it well. Says it really bothered her throat. Did use humidifier.  Does snore nightly and fatigued frequently.   Review of Systems     Objective:   Physical Exam  Constitutional: She is oriented to person, place, and time. She appears well-developed and well-nourished.  HENT:  Head: Normocephalic and atraumatic.  Right Ear: External ear normal.  Left Ear: External ear normal.  Nose: Nose normal.  Mouth/Throat: Oropharynx is clear and moist.  TMs and canals are clear. Membranes are very erythemaout in the nose.   Eyes: Conjunctivae and EOM are normal. Pupils are equal, round, and reactive to light.  Neck: Neck supple. No thyromegaly present.  Cardiovascular: Normal rate, regular rhythm and normal heart sounds.   Pulmonary/Chest: Effort normal and breath sounds normal. She has no wheezes.  Lymphadenopathy:    She has no cervical adenopathy.  Neurological: She is alert and oriented to person, place, and time.  Skin: Skin is warm and dry.  Psychiatric: She has a normal mood and affect.          Assessment & Plan:  Acute sinusitis/bronchitis.  - weill tx with clindamaycin as one of the few antibiotic she tolerates. If still has some decadron left over then can take it and encourage her to do so.  Ok to continue nasal steroid spray.    Mild OSA - Reviewed sleep study results.  AHI is 14,  Mild OSA. Reviewed results with her. Recommend trial fo CPAP. Can request nasal pillows. Recommendation is for 7 cmH2O pressure, but will start with 5 to see if can get used to it.

## 2013-12-19 ENCOUNTER — Ambulatory Visit: Payer: Medicare Other | Admitting: Family Medicine

## 2013-12-19 DIAGNOSIS — Z880 Allergy status to penicillin: Secondary | ICD-10-CM | POA: Diagnosis not present

## 2013-12-19 DIAGNOSIS — R059 Cough, unspecified: Secondary | ICD-10-CM | POA: Diagnosis not present

## 2013-12-19 DIAGNOSIS — E785 Hyperlipidemia, unspecified: Secondary | ICD-10-CM | POA: Diagnosis not present

## 2013-12-19 DIAGNOSIS — I471 Supraventricular tachycardia: Secondary | ICD-10-CM | POA: Diagnosis not present

## 2013-12-19 DIAGNOSIS — I10 Essential (primary) hypertension: Secondary | ICD-10-CM | POA: Diagnosis not present

## 2013-12-19 DIAGNOSIS — Z882 Allergy status to sulfonamides status: Secondary | ICD-10-CM | POA: Diagnosis not present

## 2013-12-19 DIAGNOSIS — K2270 Barrett's esophagus without dysplasia: Secondary | ICD-10-CM | POA: Diagnosis not present

## 2013-12-19 DIAGNOSIS — Z885 Allergy status to narcotic agent status: Secondary | ICD-10-CM | POA: Diagnosis not present

## 2013-12-19 DIAGNOSIS — Z881 Allergy status to other antibiotic agents status: Secondary | ICD-10-CM | POA: Diagnosis not present

## 2013-12-19 DIAGNOSIS — R42 Dizziness and giddiness: Secondary | ICD-10-CM | POA: Diagnosis not present

## 2013-12-19 DIAGNOSIS — Z79899 Other long term (current) drug therapy: Secondary | ICD-10-CM | POA: Diagnosis not present

## 2013-12-19 DIAGNOSIS — R1110 Vomiting, unspecified: Secondary | ICD-10-CM | POA: Diagnosis not present

## 2013-12-19 DIAGNOSIS — E269 Hyperaldosteronism, unspecified: Secondary | ICD-10-CM | POA: Diagnosis not present

## 2013-12-19 DIAGNOSIS — Z87891 Personal history of nicotine dependence: Secondary | ICD-10-CM | POA: Diagnosis not present

## 2013-12-19 DIAGNOSIS — Z91018 Allergy to other foods: Secondary | ICD-10-CM | POA: Diagnosis not present

## 2013-12-19 DIAGNOSIS — D509 Iron deficiency anemia, unspecified: Secondary | ICD-10-CM | POA: Diagnosis not present

## 2013-12-19 DIAGNOSIS — K219 Gastro-esophageal reflux disease without esophagitis: Secondary | ICD-10-CM | POA: Diagnosis not present

## 2013-12-19 DIAGNOSIS — Z91010 Allergy to peanuts: Secondary | ICD-10-CM | POA: Diagnosis not present

## 2013-12-19 DIAGNOSIS — R112 Nausea with vomiting, unspecified: Secondary | ICD-10-CM | POA: Diagnosis not present

## 2013-12-19 DIAGNOSIS — R05 Cough: Secondary | ICD-10-CM | POA: Diagnosis not present

## 2013-12-23 ENCOUNTER — Telehealth: Payer: Self-pay | Admitting: Family Medicine

## 2013-12-23 DIAGNOSIS — Z1231 Encounter for screening mammogram for malignant neoplasm of breast: Secondary | ICD-10-CM

## 2013-12-23 NOTE — Telephone Encounter (Signed)
Okay for order for 3-D mammogram. I'm assuming that's what she is talking about but not sure.

## 2013-12-23 NOTE — Telephone Encounter (Signed)
Pt called. She would like referral to [facility] for  a special mammogram and not the regular one.

## 2013-12-24 ENCOUNTER — Ambulatory Visit: Payer: Self-pay

## 2013-12-24 DIAGNOSIS — J309 Allergic rhinitis, unspecified: Secondary | ICD-10-CM | POA: Diagnosis not present

## 2013-12-24 DIAGNOSIS — J45909 Unspecified asthma, uncomplicated: Secondary | ICD-10-CM | POA: Diagnosis not present

## 2013-12-25 DIAGNOSIS — Z881 Allergy status to other antibiotic agents status: Secondary | ICD-10-CM | POA: Diagnosis not present

## 2013-12-25 DIAGNOSIS — Z882 Allergy status to sulfonamides status: Secondary | ICD-10-CM | POA: Diagnosis not present

## 2013-12-25 DIAGNOSIS — Z880 Allergy status to penicillin: Secondary | ICD-10-CM | POA: Diagnosis not present

## 2013-12-25 DIAGNOSIS — Z79899 Other long term (current) drug therapy: Secondary | ICD-10-CM | POA: Diagnosis not present

## 2013-12-25 DIAGNOSIS — Z888 Allergy status to other drugs, medicaments and biological substances status: Secondary | ICD-10-CM | POA: Diagnosis not present

## 2013-12-25 DIAGNOSIS — L049 Acute lymphadenitis, unspecified: Secondary | ICD-10-CM | POA: Diagnosis not present

## 2013-12-26 ENCOUNTER — Telehealth: Payer: Self-pay | Admitting: Family Medicine

## 2013-12-26 ENCOUNTER — Encounter: Payer: Self-pay | Admitting: Family Medicine

## 2013-12-26 DIAGNOSIS — R5381 Other malaise: Secondary | ICD-10-CM | POA: Diagnosis not present

## 2013-12-26 DIAGNOSIS — R569 Unspecified convulsions: Secondary | ICD-10-CM | POA: Diagnosis not present

## 2013-12-26 DIAGNOSIS — R5383 Other fatigue: Secondary | ICD-10-CM | POA: Diagnosis not present

## 2013-12-26 DIAGNOSIS — R839 Unspecified abnormal finding in cerebrospinal fluid: Secondary | ICD-10-CM | POA: Diagnosis not present

## 2013-12-26 DIAGNOSIS — G609 Hereditary and idiopathic neuropathy, unspecified: Secondary | ICD-10-CM | POA: Diagnosis not present

## 2013-12-26 DIAGNOSIS — R55 Syncope and collapse: Secondary | ICD-10-CM | POA: Diagnosis not present

## 2013-12-26 NOTE — Telephone Encounter (Signed)
Order reprinted and given to Norton Pastel

## 2013-12-26 NOTE — Telephone Encounter (Signed)
Gowri called to follow up on referral for Mammogram. I do not see a referral for this in workqueue.

## 2013-12-27 DIAGNOSIS — M549 Dorsalgia, unspecified: Secondary | ICD-10-CM | POA: Diagnosis not present

## 2013-12-27 DIAGNOSIS — Z8673 Personal history of transient ischemic attack (TIA), and cerebral infarction without residual deficits: Secondary | ICD-10-CM | POA: Diagnosis not present

## 2013-12-27 DIAGNOSIS — I10 Essential (primary) hypertension: Secondary | ICD-10-CM | POA: Diagnosis not present

## 2013-12-27 DIAGNOSIS — G971 Other reaction to spinal and lumbar puncture: Secondary | ICD-10-CM | POA: Diagnosis not present

## 2013-12-27 DIAGNOSIS — R51 Headache: Secondary | ICD-10-CM | POA: Diagnosis not present

## 2013-12-27 DIAGNOSIS — Z79899 Other long term (current) drug therapy: Secondary | ICD-10-CM | POA: Diagnosis not present

## 2013-12-29 ENCOUNTER — Other Ambulatory Visit: Payer: Self-pay | Admitting: *Deleted

## 2013-12-29 DIAGNOSIS — N644 Mastodynia: Secondary | ICD-10-CM

## 2013-12-29 DIAGNOSIS — I471 Supraventricular tachycardia: Secondary | ICD-10-CM | POA: Diagnosis not present

## 2013-12-29 DIAGNOSIS — Z87891 Personal history of nicotine dependence: Secondary | ICD-10-CM | POA: Diagnosis not present

## 2013-12-29 DIAGNOSIS — Z79899 Other long term (current) drug therapy: Secondary | ICD-10-CM | POA: Diagnosis not present

## 2013-12-29 DIAGNOSIS — K219 Gastro-esophageal reflux disease without esophagitis: Secondary | ICD-10-CM | POA: Diagnosis not present

## 2013-12-29 DIAGNOSIS — R079 Chest pain, unspecified: Secondary | ICD-10-CM | POA: Diagnosis not present

## 2013-12-29 DIAGNOSIS — Z888 Allergy status to other drugs, medicaments and biological substances status: Secondary | ICD-10-CM | POA: Diagnosis not present

## 2013-12-29 DIAGNOSIS — R110 Nausea: Secondary | ICD-10-CM | POA: Diagnosis not present

## 2013-12-29 DIAGNOSIS — I10 Essential (primary) hypertension: Secondary | ICD-10-CM | POA: Diagnosis not present

## 2013-12-29 DIAGNOSIS — Z885 Allergy status to narcotic agent status: Secondary | ICD-10-CM | POA: Diagnosis not present

## 2013-12-29 DIAGNOSIS — Z882 Allergy status to sulfonamides status: Secondary | ICD-10-CM | POA: Diagnosis not present

## 2013-12-29 DIAGNOSIS — G40909 Epilepsy, unspecified, not intractable, without status epilepticus: Secondary | ICD-10-CM | POA: Diagnosis not present

## 2013-12-29 DIAGNOSIS — Z886 Allergy status to analgesic agent status: Secondary | ICD-10-CM | POA: Diagnosis not present

## 2013-12-29 DIAGNOSIS — R1013 Epigastric pain: Secondary | ICD-10-CM | POA: Diagnosis not present

## 2013-12-29 DIAGNOSIS — R109 Unspecified abdominal pain: Secondary | ICD-10-CM | POA: Diagnosis not present

## 2013-12-29 DIAGNOSIS — D509 Iron deficiency anemia, unspecified: Secondary | ICD-10-CM | POA: Diagnosis not present

## 2013-12-29 DIAGNOSIS — Z883 Allergy status to other anti-infective agents status: Secondary | ICD-10-CM | POA: Diagnosis not present

## 2013-12-29 DIAGNOSIS — R6884 Jaw pain: Secondary | ICD-10-CM | POA: Diagnosis not present

## 2013-12-29 DIAGNOSIS — E785 Hyperlipidemia, unspecified: Secondary | ICD-10-CM | POA: Diagnosis not present

## 2013-12-29 DIAGNOSIS — F411 Generalized anxiety disorder: Secondary | ICD-10-CM | POA: Diagnosis not present

## 2013-12-30 DIAGNOSIS — I10 Essential (primary) hypertension: Secondary | ICD-10-CM | POA: Diagnosis not present

## 2013-12-30 DIAGNOSIS — M549 Dorsalgia, unspecified: Secondary | ICD-10-CM | POA: Diagnosis not present

## 2013-12-30 DIAGNOSIS — F459 Somatoform disorder, unspecified: Secondary | ICD-10-CM | POA: Diagnosis not present

## 2013-12-30 DIAGNOSIS — R110 Nausea: Secondary | ICD-10-CM | POA: Diagnosis not present

## 2013-12-30 DIAGNOSIS — R51 Headache: Secondary | ICD-10-CM | POA: Diagnosis not present

## 2013-12-30 DIAGNOSIS — Z8673 Personal history of transient ischemic attack (TIA), and cerebral infarction without residual deficits: Secondary | ICD-10-CM | POA: Diagnosis not present

## 2013-12-30 DIAGNOSIS — G8929 Other chronic pain: Secondary | ICD-10-CM | POA: Diagnosis not present

## 2013-12-30 DIAGNOSIS — K2270 Barrett's esophagus without dysplasia: Secondary | ICD-10-CM | POA: Diagnosis not present

## 2013-12-30 DIAGNOSIS — K7689 Other specified diseases of liver: Secondary | ICD-10-CM | POA: Diagnosis not present

## 2014-01-01 ENCOUNTER — Ambulatory Visit: Payer: Medicare Other | Admitting: Sports Medicine

## 2014-01-01 ENCOUNTER — Encounter: Payer: Self-pay | Admitting: Family Medicine

## 2014-01-01 ENCOUNTER — Ambulatory Visit (INDEPENDENT_AMBULATORY_CARE_PROVIDER_SITE_OTHER): Payer: Medicare Other | Admitting: Family Medicine

## 2014-01-01 VITALS — BP 151/102 | HR 105 | Wt 187.0 lb

## 2014-01-01 DIAGNOSIS — G40802 Other epilepsy, not intractable, without status epilepticus: Secondary | ICD-10-CM | POA: Diagnosis not present

## 2014-01-01 DIAGNOSIS — I10 Essential (primary) hypertension: Secondary | ICD-10-CM

## 2014-01-01 DIAGNOSIS — G4733 Obstructive sleep apnea (adult) (pediatric): Secondary | ICD-10-CM | POA: Diagnosis not present

## 2014-01-01 DIAGNOSIS — R51 Headache: Secondary | ICD-10-CM

## 2014-01-01 NOTE — Progress Notes (Signed)
   Subjective:    Patient ID: Jennifer [name], female    DOB: 29-Nov-1965, 48 y.o.   MRN: [id number]  HPI pt reports that she has had some problems with memory recall,shaking episodes, and her left eye feeling like its pulling inward. she recently had a spinal tap ( last Friday) that she has been experiencing some residual headaches since. [place] had to go to [place] the weekend and had a blood patch but says it didn't take.  Initially her headache was worse with standingup and sitting up.  Now it is just persistantly there wether lying down or sitting up.   Her mother in law is coming into town next week and will be her for about 5 days Jennifer [name] been following her diet and try to drink some caffeine to help the headache.  [facility] - has been contacted by [facility] and they are supposed ot come out next week.    HTN- BP is still high today. No CP or SOB but is feeling lightheaded and + HA.  Has been drinking caffeine to help with HA s/p spinal.    Had another episode where she was confused and then felt shaky on the inside afterwards.    She got very frustrated with her in nose and throat specialist. She became very fresh air and walked out of the office after he told her that it was really nothing wrong with her. Her throat and sinuses are little better compared to when I saw her several weeks ago for a sinus infection.  Review of Systems     Objective:   Physical Exam  Constitutional: She is oriented to person, place, and time. She appears well-developed and well-nourished.  HENT:  Head: Normocephalic and atraumatic.  Cardiovascular: Normal rate, regular rhythm and normal heart sounds.   Pulmonary/Chest: Effort normal and breath sounds normal.  Neurological: She is alert and oriented to person, place, and time.  Skin: Skin is warm and dry.  Psychiatric: She has a normal mood and affect. Her behavior is normal.          Assessment & Plan:  HTN - encouraged her to at least take  her amlodipine today.  Followup in one month.  Headache-unclear if related to her lumbar puncture which she had 6 days ago or could be related to check her blood pressures high today. Or maybe a combination of both. Encouraged her to take her amlodipine today when she gets home. She tolerates it well except for the fact that she feels like it gives her little but tachycardia but she tends to get tachycardic episodes even off the medication to try to reassure her as much as I could.  Possible seizure disorder-she does not have the result of a lumbar puncture back yet but should get them back around June 5. I think that her next followup appointment with Dr. Lawrence Marseilles. She has not started the antiseizure medication yet but is hoping that she can be put into the hospital for a couple of days and be monitored on the medication. She's very nervous about taking it on her own at home to the point that it creates a normal some of anxiety and almost panic.  OSA- Hopefully will get her equipment next week. She is to call if a problem.

## 2014-01-02 ENCOUNTER — Encounter (HOSPITAL_BASED_OUTPATIENT_CLINIC_OR_DEPARTMENT_OTHER): Payer: Self-pay | Admitting: Emergency Medicine

## 2014-01-02 ENCOUNTER — Emergency Department (HOSPITAL_BASED_OUTPATIENT_CLINIC_OR_DEPARTMENT_OTHER)
Admission: EM | Admit: 2014-01-02 | Discharge: 2014-01-02 | Disposition: A | Discharge status: Home / Self Care | Payer: Medicare Other | Attending: Emergency Medicine | Admitting: Emergency Medicine

## 2014-01-02 ENCOUNTER — Emergency Department (HOSPITAL_BASED_OUTPATIENT_CLINIC_OR_DEPARTMENT_OTHER): Payer: Medicare Other

## 2014-01-02 DIAGNOSIS — Z87891 Personal history of nicotine dependence: Secondary | ICD-10-CM | POA: Diagnosis not present

## 2014-01-02 DIAGNOSIS — Z79899 Other long term (current) drug therapy: Secondary | ICD-10-CM | POA: Diagnosis not present

## 2014-01-02 DIAGNOSIS — M25519 Pain in unspecified shoulder: Secondary | ICD-10-CM | POA: Insufficient documentation

## 2014-01-02 DIAGNOSIS — Z85828 Personal history of other malignant neoplasm of skin: Secondary | ICD-10-CM | POA: Diagnosis not present

## 2014-01-02 DIAGNOSIS — K8020 Calculus of gallbladder without cholecystitis without obstruction: Secondary | ICD-10-CM

## 2014-01-02 DIAGNOSIS — Z8542 Personal history of malignant neoplasm of other parts of uterus: Secondary | ICD-10-CM | POA: Insufficient documentation

## 2014-01-02 DIAGNOSIS — R1013 Epigastric pain: Secondary | ICD-10-CM | POA: Diagnosis not present

## 2014-01-02 DIAGNOSIS — Z8614 Personal history of Methicillin resistant Staphylococcus aureus infection: Secondary | ICD-10-CM | POA: Insufficient documentation

## 2014-01-02 DIAGNOSIS — R079 Chest pain, unspecified: Secondary | ICD-10-CM | POA: Diagnosis not present

## 2014-01-02 DIAGNOSIS — I10 Essential (primary) hypertension: Secondary | ICD-10-CM | POA: Diagnosis not present

## 2014-01-02 DIAGNOSIS — Z792 Long term (current) use of antibiotics: Secondary | ICD-10-CM | POA: Diagnosis not present

## 2014-01-02 DIAGNOSIS — J45909 Unspecified asthma, uncomplicated: Secondary | ICD-10-CM | POA: Diagnosis not present

## 2014-01-02 DIAGNOSIS — G8929 Other chronic pain: Secondary | ICD-10-CM | POA: Diagnosis not present

## 2014-01-02 DIAGNOSIS — G4733 Obstructive sleep apnea (adult) (pediatric): Secondary | ICD-10-CM | POA: Diagnosis not present

## 2014-01-02 DIAGNOSIS — Z8659 Personal history of other mental and behavioral disorders: Secondary | ICD-10-CM | POA: Insufficient documentation

## 2014-01-02 DIAGNOSIS — K219 Gastro-esophageal reflux disease without esophagitis: Secondary | ICD-10-CM | POA: Diagnosis not present

## 2014-01-02 DIAGNOSIS — Z8639 Personal history of other endocrine, nutritional and metabolic disease: Secondary | ICD-10-CM | POA: Insufficient documentation

## 2014-01-02 DIAGNOSIS — Z862 Personal history of diseases of the blood and blood-forming organs and certain disorders involving the immune mechanism: Secondary | ICD-10-CM | POA: Diagnosis not present

## 2014-01-02 DIAGNOSIS — IMO0002 Reserved for concepts with insufficient information to code with codable children: Secondary | ICD-10-CM | POA: Diagnosis not present

## 2014-01-02 LAB — LIPASE, BLOOD: Lipase: 42 U/L (ref 11–59)

## 2014-01-02 LAB — CBC WITH DIFFERENTIAL/PLATELET
Basophils Absolute: 0 10*3/uL (ref 0.0–0.1)
Basophils Relative: 0 % (ref 0–1)
Eosinophils Absolute: 0.1 10*3/uL (ref 0.0–0.7)
Eosinophils Relative: 2 % (ref 0–5)
HCT: 40.1 % (ref 36.0–46.0)
Hemoglobin: 13.5 g/dL (ref 12.0–15.0)
Lymphocytes Relative: 17 % (ref 12–46)
Lymphs Abs: 1 10*3/uL (ref 0.7–4.0)
MCH: 30 pg (ref 26.0–34.0)
MCHC: 33.7 g/dL (ref 30.0–36.0)
MCV: 89.1 fL (ref 78.0–100.0)
Monocytes Absolute: 0.6 10*3/uL (ref 0.1–1.0)
Monocytes Relative: 9 % (ref 3–12)
Neutro Abs: 4.4 10*3/uL (ref 1.7–7.7)
Neutrophils Relative %: 71 % (ref 43–77)
Platelets: 205 10*3/uL (ref 150–400)
RBC: 4.5 MIL/uL (ref 3.87–5.11)
RDW: 13.8 % (ref 11.5–15.5)
WBC: 6.2 10*3/uL (ref 4.0–10.5)

## 2014-01-02 LAB — COMPREHENSIVE METABOLIC PANEL
ALT: 34 U/L (ref 0–35)
AST: 24 U/L (ref 0–37)
Albumin: 3.7 g/dL (ref 3.5–5.2)
Alkaline Phosphatase: 84 U/L (ref 39–117)
BUN: 15 mg/dL (ref 6–23)
CO2: 26 mEq/L (ref 19–32)
Calcium: 9.5 mg/dL (ref 8.4–10.5)
Chloride: 103 mEq/L (ref 96–112)
Creatinine, Ser: 0.5 mg/dL (ref 0.50–1.10)
GFR calc Af Amer: >90 mL/min (ref >90)
GFR calc non Af Amer: >90 mL/min (ref >90)
Glucose, Bld: 133 mg/dL — ABNORMAL HIGH (ref 70–99)
Potassium: 3.8 mEq/L (ref 3.7–5.3)
Sodium: 144 mEq/L (ref 137–147)
Total Bilirubin: 0.2 mg/dL — ABNORMAL LOW (ref 0.3–1.2)
Total Protein: 7.2 g/dL (ref 6.0–8.3)

## 2014-01-02 NOTE — ED Notes (Signed)
Pt. Has multiple complaints with today c/o R upper quadrant pain.  Pt. In no distress with no vomiting today.  Pt. Reports she saw her MD yesterday and steel feeling bad today.  Pt. Reports bilat. Arm pain also.  No shortness of breath noted and no chest pain reported.  Pt. Reports lingering headache.
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
## 2014-01-02 NOTE — Discharge Instructions (Signed)
Cholelithiasis Cholelithiasis (also called gallstones) is a form of gallbladder disease. The gallbladder is a small organ that helps you digest fats. Symptoms of gallstones are:  Feeling sick to your stomach (nausea).  Throwing up (vomiting).  Belly pain.  Yellowing of the skin (jaundice).  Sudden pain. You may feel the pain for minutes to hours.  Fever.  Pain to the touch. HOME CARE  Only take medicines as told by your doctor.  Eat a low-fat diet until you see your doctor again. Eating fat can result in pain.  Follow up with your doctor as told. Attacks usually happen time after time. Surgery is usually needed for permanent treatment. GET HELP RIGHT AWAY IF:   Your pain gets worse.  Your pain is not helped by medicines.  You have a fever and lasting symptoms for more than 2 3 days.  You have a fever and your symptoms suddenly get worse.  You keep feeling sick to your stomach and throwing up. MAKE SURE YOU:   Understand these instructions.  Will watch your condition.  Will get help right away if you are not doing well or get worse. Document Released: 01/17/2008 Document Revised: 04/02/2013 Document Reviewed: 01/22/2013 [facility] Patient Information 2014 [place], [place].

## 2014-01-04 DIAGNOSIS — R51 Headache: Secondary | ICD-10-CM | POA: Diagnosis not present

## 2014-01-04 DIAGNOSIS — R002 Palpitations: Secondary | ICD-10-CM | POA: Diagnosis not present

## 2014-01-04 DIAGNOSIS — I10 Essential (primary) hypertension: Secondary | ICD-10-CM | POA: Diagnosis not present

## 2014-01-04 DIAGNOSIS — Z886 Allergy status to analgesic agent status: Secondary | ICD-10-CM | POA: Diagnosis not present

## 2014-01-06 DIAGNOSIS — Z1231 Encounter for screening mammogram for malignant neoplasm of breast: Secondary | ICD-10-CM | POA: Diagnosis not present

## 2014-01-06 DIAGNOSIS — R928 Other abnormal and inconclusive findings on diagnostic imaging of breast: Secondary | ICD-10-CM | POA: Diagnosis not present

## 2014-01-06 DIAGNOSIS — N644 Mastodynia: Secondary | ICD-10-CM | POA: Diagnosis not present

## 2014-01-07 DIAGNOSIS — F4310 Post-traumatic stress disorder, unspecified: Secondary | ICD-10-CM | POA: Diagnosis not present

## 2014-01-08 ENCOUNTER — Encounter: Payer: Self-pay | Admitting: Family Medicine

## 2014-01-13 ENCOUNTER — Telehealth: Payer: Self-pay | Admitting: *Deleted

## 2014-01-13 DIAGNOSIS — K589 Irritable bowel syndrome without diarrhea: Secondary | ICD-10-CM | POA: Diagnosis not present

## 2014-01-13 DIAGNOSIS — G4733 Obstructive sleep apnea (adult) (pediatric): Secondary | ICD-10-CM

## 2014-01-13 DIAGNOSIS — R1013 Epigastric pain: Secondary | ICD-10-CM | POA: Diagnosis not present

## 2014-01-13 NOTE — Telephone Encounter (Signed)
Pt stated that she would like a bi-pap she said it feels like her lungs hurt and the company has been out to fit her with a nasal canula to observe how she would do on this and she is not tolerating this well. She thought that she had discuss this w/dr.metheney her to have a bi-pap she is unsure of the guidelines for obtaining the bi-pap for medicaid please advise.Teddy Spike

## 2014-01-14 ENCOUNTER — Ambulatory Visit (INDEPENDENT_AMBULATORY_CARE_PROVIDER_SITE_OTHER): Payer: Medicare Other | Admitting: Sports Medicine

## 2014-01-14 ENCOUNTER — Encounter: Payer: Self-pay | Admitting: Sports Medicine

## 2014-01-14 ENCOUNTER — Ambulatory Visit: Payer: Medicare Other | Admitting: Sports Medicine

## 2014-01-14 VITALS — BP 151/95 | HR 91 | Ht 62.0 in | Wt 186.0 lb

## 2014-01-14 DIAGNOSIS — I10 Essential (primary) hypertension: Secondary | ICD-10-CM | POA: Diagnosis not present

## 2014-01-14 DIAGNOSIS — Z79899 Other long term (current) drug therapy: Secondary | ICD-10-CM | POA: Diagnosis not present

## 2014-01-14 DIAGNOSIS — M7532 Calcific tendinitis of left shoulder: Secondary | ICD-10-CM

## 2014-01-14 DIAGNOSIS — R0789 Other chest pain: Secondary | ICD-10-CM | POA: Diagnosis not present

## 2014-01-14 DIAGNOSIS — R079 Chest pain, unspecified: Secondary | ICD-10-CM | POA: Diagnosis not present

## 2014-01-14 DIAGNOSIS — M25519 Pain in unspecified shoulder: Secondary | ICD-10-CM | POA: Diagnosis not present

## 2014-01-14 DIAGNOSIS — M7530 Calcific tendinitis of unspecified shoulder: Secondary | ICD-10-CM | POA: Diagnosis not present

## 2014-01-14 DIAGNOSIS — Z8673 Personal history of transient ischemic attack (TIA), and cerebral infarction without residual deficits: Secondary | ICD-10-CM | POA: Diagnosis not present

## 2014-01-14 DIAGNOSIS — Z888 Allergy status to other drugs, medicaments and biological substances status: Secondary | ICD-10-CM | POA: Diagnosis not present

## 2014-01-14 DIAGNOSIS — Z886 Allergy status to analgesic agent status: Secondary | ICD-10-CM | POA: Diagnosis not present

## 2014-01-14 NOTE — Telephone Encounter (Signed)
I believe she is using [facility]. We can certainly call and ask. This is sort of an unusual request. So I'm not sure if it would be covered with her Medicaid or not.

## 2014-01-14 NOTE — Assessment & Plan Note (Addendum)
Ultrasound guided barbotage and aspiration of calcific deposit in the supraspinatus. We did inject Marcaine and lidocaine, we are unable to use steroid in this patient. Sling for a solid week as we did significantly traumatize the insertion of the supraspinatus. Return to see me a week. She declines any tramadol or narcotics. I do expect significant pain after this procedure, she only wants ibuprofen and Aleve.

## 2014-01-14 NOTE — Telephone Encounter (Addendum)
Pt called back and said that she spoke w/pulmonology and respiratory and was told that her case would require further investigation.   Called [facility] spoke w/Jenara and gave her the background of what was going on with the pt and asked her what are the guidelines to change pt from a CPAP to Bi-Pap.  She then connected me with Barbaraann Rondo and he informed me that she would need orders to change from Cpap to bipap with titration.  this would have to be done at a facility.  I informed him that she has recently had sleep study done and asked if an additional sleep study will be needed and he checked on this and said that a new order for sleep study will need to be done to see what her settings will be on bipap if she should qualify.  Pt called back and informed of recommendations and told that she will need to have new sleep study done. She would like to be seen at [facility] in [facility] (neurology) to have sleep study done.Jennifer [name]

## 2014-01-14 NOTE — Progress Notes (Signed)
  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 She is continued to have pain and we have been very limited in how we treat her as any steroids have historically caused psychosis. In the past we have discussed ultrasound guided barbotage of the calcific deposit with aspiration. Pain is moderate, persistent, over the deltoid, with impingement type symptoms.  Past medical history, Surgical history, Family history not pertinant except as noted below, Social history, Allergies, and medications have been entered into the medical record, reviewed, and no changes needed.   Review of Systems: No fevers, chills, night sweats, weight loss, chest pain, or shortness of breath.   Objective:    General: Well Developed, well nourished, and in no acute distress.  Neuro: Alert and oriented x3, extra-ocular muscles intact, sensation grossly intact.  HEENT: Normocephalic, atraumatic, pupils equal round reactive to light, neck supple, no masses, no lymphadenopathy, thyroid nonpalpable.  Skin: Warm and dry, no rashes. Cardiac: Regular rate and rhythm, no murmurs rubs or gallops, no lower extremity edema.  Respiratory: Clear to auscultation bilaterally. Not using accessory muscles, speaking in full sentences.  Procedure: Real-time Ultrasound Guided barbotage/aspiration of left supraspinatus calcific tendinitis Device: GE Logiq E  Verbal informed consent obtained.  Time-out conducted.  Noted no overlying erythema, induration, or other signs of local infection.  Skin prepped in a sterile fashion.  Local anesthesia: Topical Ethyl chloride.  With sterile technique and under real time ultrasound guidance:  18-gauge needle advanced into the calcific deposit, I injected a bit of lidocaine to numb the area. The night repeatedly aspirated and injected lidocaine in to the calcific deposit, and noted a precipitating white substance in my syringe. I  then switched to ranges and injected a total of 3 cc lidocaine, 3 cc Marcaine into the subacromial bursa and around the supraspinatus. Completed without difficulty  Pain immediately resolved suggesting accurate placement of the medication.  Advised to call if fevers/chills, erythema, induration, drainage, or persistent bleeding.  Images permanently stored and available for review in the ultrasound unit.  Impression: Technically successful ultrasound guided barbotage of left supraspinatus calcific tendinitis.  Impression and Recommendations:    I spent 40 minutes with this patient, greater than 50% was face to face time counseling regarding the above diagnosis.

## 2014-01-15 ENCOUNTER — Telehealth: Payer: Self-pay | Admitting: *Deleted

## 2014-01-15 ENCOUNTER — Emergency Department (HOSPITAL_BASED_OUTPATIENT_CLINIC_OR_DEPARTMENT_OTHER)
Admission: EM | Admit: 2014-01-15 | Discharge: 2014-01-15 | Disposition: A | Discharge status: Home / Self Care | Payer: Medicare Other | Attending: Emergency Medicine | Admitting: Emergency Medicine

## 2014-01-15 ENCOUNTER — Encounter (HOSPITAL_BASED_OUTPATIENT_CLINIC_OR_DEPARTMENT_OTHER): Payer: Self-pay | Admitting: Emergency Medicine

## 2014-01-15 DIAGNOSIS — Z862 Personal history of diseases of the blood and blood-forming organs and certain disorders involving the immune mechanism: Secondary | ICD-10-CM | POA: Diagnosis not present

## 2014-01-15 DIAGNOSIS — Z9889 Other specified postprocedural states: Secondary | ICD-10-CM | POA: Diagnosis not present

## 2014-01-15 DIAGNOSIS — Z880 Allergy status to penicillin: Secondary | ICD-10-CM | POA: Diagnosis not present

## 2014-01-15 DIAGNOSIS — Z87891 Personal history of nicotine dependence: Secondary | ICD-10-CM | POA: Insufficient documentation

## 2014-01-15 DIAGNOSIS — G8929 Other chronic pain: Secondary | ICD-10-CM | POA: Diagnosis not present

## 2014-01-15 DIAGNOSIS — J45909 Unspecified asthma, uncomplicated: Secondary | ICD-10-CM | POA: Diagnosis not present

## 2014-01-15 DIAGNOSIS — Z79899 Other long term (current) drug therapy: Secondary | ICD-10-CM | POA: Insufficient documentation

## 2014-01-15 DIAGNOSIS — Z8659 Personal history of other mental and behavioral disorders: Secondary | ICD-10-CM | POA: Diagnosis not present

## 2014-01-15 DIAGNOSIS — Z8544 Personal history of malignant neoplasm of other female genital organs: Secondary | ICD-10-CM | POA: Insufficient documentation

## 2014-01-15 DIAGNOSIS — Z8619 Personal history of other infectious and parasitic diseases: Secondary | ICD-10-CM | POA: Diagnosis not present

## 2014-01-15 DIAGNOSIS — K219 Gastro-esophageal reflux disease without esophagitis: Secondary | ICD-10-CM | POA: Diagnosis not present

## 2014-01-15 DIAGNOSIS — M25519 Pain in unspecified shoulder: Secondary | ICD-10-CM | POA: Insufficient documentation

## 2014-01-15 DIAGNOSIS — Z9981 Dependence on supplemental oxygen: Secondary | ICD-10-CM | POA: Diagnosis not present

## 2014-01-15 DIAGNOSIS — G4730 Sleep apnea, unspecified: Secondary | ICD-10-CM | POA: Insufficient documentation

## 2014-01-15 DIAGNOSIS — Z8639 Personal history of other endocrine, nutritional and metabolic disease: Secondary | ICD-10-CM | POA: Insufficient documentation

## 2014-01-15 DIAGNOSIS — Z85828 Personal history of other malignant neoplasm of skin: Secondary | ICD-10-CM | POA: Insufficient documentation

## 2014-01-15 DIAGNOSIS — I10 Essential (primary) hypertension: Secondary | ICD-10-CM | POA: Diagnosis not present

## 2014-01-15 DIAGNOSIS — M25512 Pain in left shoulder: Secondary | ICD-10-CM

## 2014-01-15 MED ORDER — TRAMADOL HCL 50 MG PO TABS: ORAL_TABLET | ORAL | Status: DC

## 2014-01-15 MED ORDER — HYDROCODONE-ACETAMINOPHEN 5-325 MG PO TABS: 1.0000 | ORAL_TABLET | ORAL | Status: DC | PRN

## 2014-01-15 NOTE — Discharge Instructions (Signed)
Ibuprofen 600 mg 3 times daily for the next 5 days. Hydrocodone as needed for pain not relieved with ibuprofen.  Followup with your primary Dr. if not improving in the next week. Continue to wear your sling for the next several days.   Shoulder Pain The shoulder is the joint that connects your arms to your body. The bones that form the shoulder joint include the upper arm bone (humerus), the shoulder blade (scapula), and the collarbone (clavicle). The top of the humerus is shaped like a ball and fits into a rather flat socket on the scapula (glenoid cavity). A combination of muscles and strong, fibrous tissues that connect muscles to bones (tendons) support your shoulder joint and hold the ball in the socket. Small, fluid-filled sacs (bursae) are located in different areas of the joint. They act as cushions between the bones and the overlying soft tissues and help reduce friction between the gliding tendons and the bone as you move your arm. Your shoulder joint allows a wide range of motion in your arm. This range of motion allows you to do things like scratch your back or throw a ball. However, this range of motion also makes your shoulder more prone to pain from overuse and injury. Causes of shoulder pain can originate from both injury and overuse and usually can be grouped in the following four categories:  Redness, swelling, and pain (inflammation) of the tendon (tendinitis) or the bursae (bursitis).  Instability, such as a dislocation of the joint.  Inflammation of the joint (arthritis).  Broken bone (fracture). HOME CARE INSTRUCTIONS   Apply ice to the sore area.  Put ice in a plastic bag.  Place a towel between your skin and the bag.  Leave the ice on for 15-20 minutes, 03-04 times per day for the first 2 days.  Stop using cold packs if they do not help with the pain.  If you have a shoulder sling or immobilizer, wear it as long as your caregiver instructs. Only remove it to shower  or bathe. Move your arm as little as possible, but keep your hand moving to prevent swelling.  Squeeze a soft ball or foam pad as much as possible to help prevent swelling.  Only take over-the-counter or prescription medicines for pain, discomfort, or fever as directed by your caregiver. SEEK MEDICAL CARE IF:   Your shoulder pain increases, or new pain develops in your arm, hand, or fingers.  Your hand or fingers become cold and numb.  Your pain is not relieved with medicines. SEEK IMMEDIATE MEDICAL CARE IF:   Your arm, hand, or fingers are numb or tingling.  Your arm, hand, or fingers are significantly swollen or turn white or blue. MAKE SURE YOU:   Understand these instructions.  Will watch your condition.  Will get help right away if you are not doing well or get worse. Document Released: 05/10/2005 Document Revised: 04/24/2012 Document Reviewed: 07/15/2011 [facility] Patient Information 2014 [place].

## 2014-01-15 NOTE — Telephone Encounter (Signed)
Pt calls in tons of pain.  I advised her that you were ok with sending something for pain & she chose tramadol over norco.

## 2014-01-15 NOTE — Telephone Encounter (Signed)
Faxed

## 2014-01-15 NOTE — Telephone Encounter (Signed)
Tramadol

## 2014-01-15 NOTE — ED Notes (Signed)
Pt seen at [facility] yesterday for shoulder pain.  Pt states she had procedure done yesterday, but is now having more pain.  Pt states she is nauseated.
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
## 2014-01-16 ENCOUNTER — Ambulatory Visit: Payer: Medicare Other | Admitting: Family Medicine

## 2014-01-16 ENCOUNTER — Encounter: Payer: Self-pay | Admitting: Family Medicine

## 2014-01-16 DIAGNOSIS — G501 Atypical facial pain: Secondary | ICD-10-CM | POA: Diagnosis not present

## 2014-01-16 DIAGNOSIS — R5381 Other malaise: Secondary | ICD-10-CM | POA: Diagnosis not present

## 2014-01-16 DIAGNOSIS — G609 Hereditary and idiopathic neuropathy, unspecified: Secondary | ICD-10-CM | POA: Diagnosis not present

## 2014-01-16 DIAGNOSIS — G35 Multiple sclerosis: Secondary | ICD-10-CM | POA: Diagnosis not present

## 2014-01-16 IMAGING — CT CT ABD-PELV W/ CM
2 of 5 series · 15 of 46 positions shown, 17 images · IV contrast (APPLIED)
Comparison: Abdominal ultrasound 07/18/2012; prior CT
abdomen/pelvis 11/24/2011

***ADDENDUM*** CREATED: 10/23/2012 [DATE]

The following finding which is described in the body of the report
should been included in the
CLINICAL DATA: Abdominal pain
CT ABDOMEN AND PELVIS WITH CONTRAST
TECHNIQUE: Multidetector CT imaging of the abdomen and pelvis was
performed following the standard protocol during bolus
administration of intravenous contrast.
Contrast: 50mL OMNIPAQUE IOHEXOL 300 MG/ML  SOLN, 100mL OMNIPAQUE
IOHEXOL 300 MG/ML  SOLN

[Series 2: abd/pelvis 5.0 b31f · axial · 0.73mm/px · z∈[-426,+24]mm · 12 of 102 slices shown, 14 images]
[im 6/102  soft-tissue]
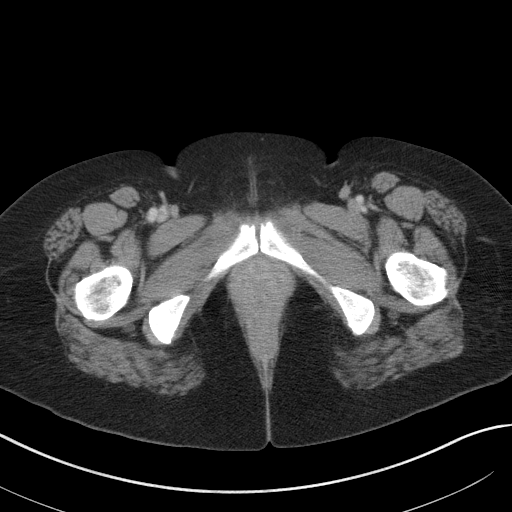
[im 6/102  bone]
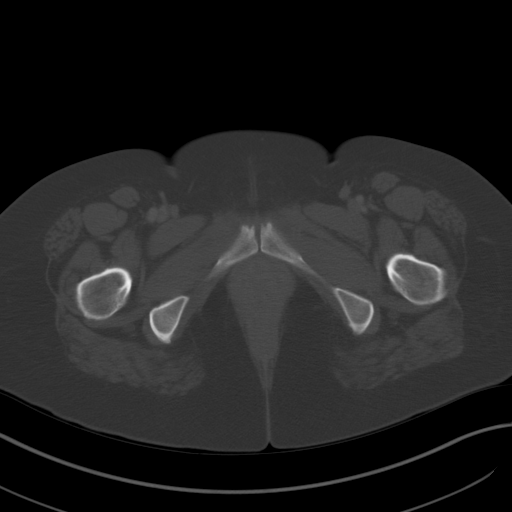
[im 16/102  soft-tissue]
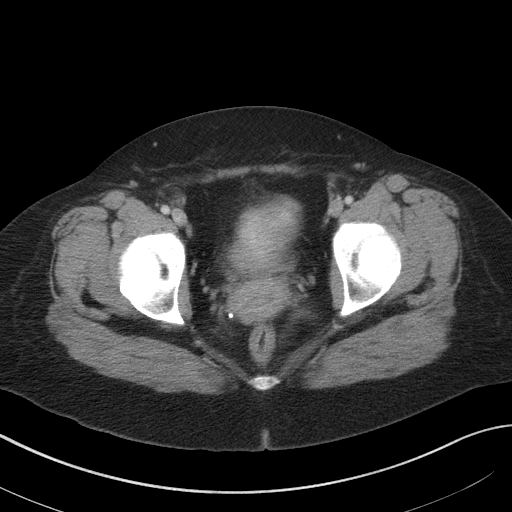
[im 22/102  soft-tissue]
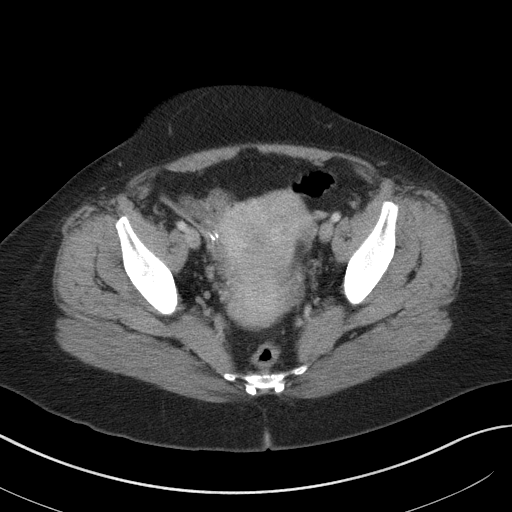
[im 32/102  soft-tissue]
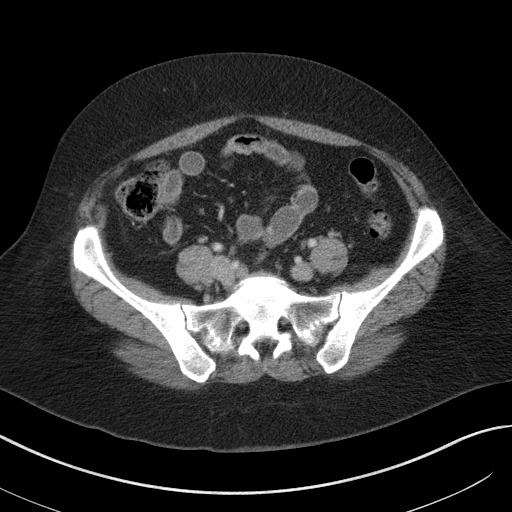
[im 38/102  soft-tissue]
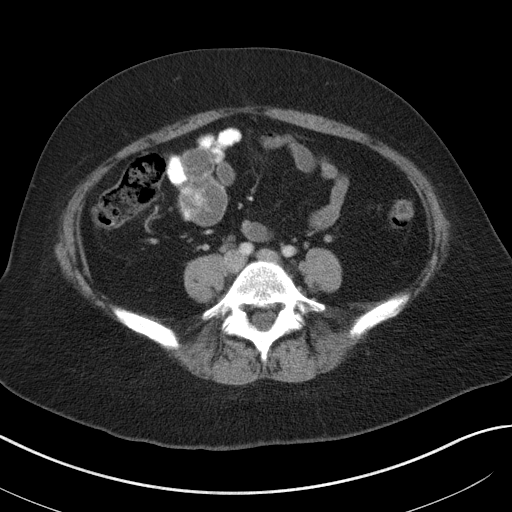
[im 48/102  soft-tissue]
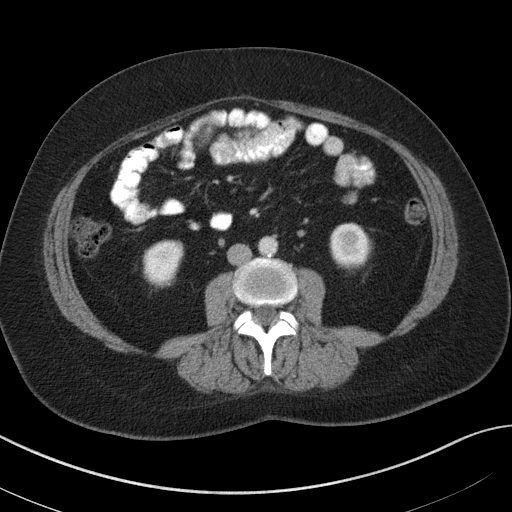
[im 54/102  soft-tissue]
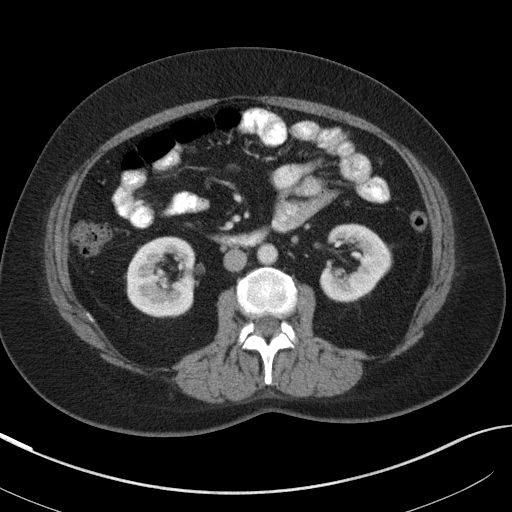
[im 64/102  soft-tissue]
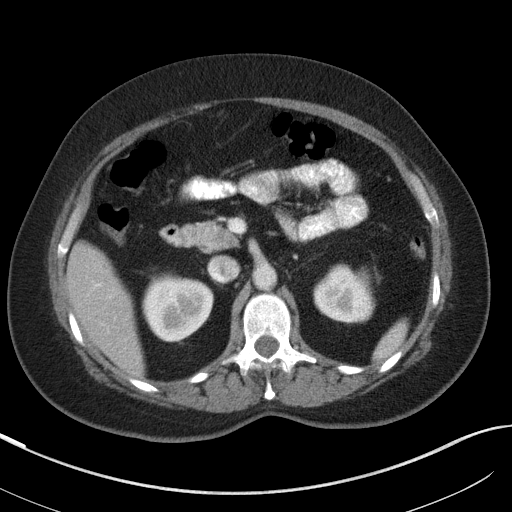
[im 70/102  soft-tissue]
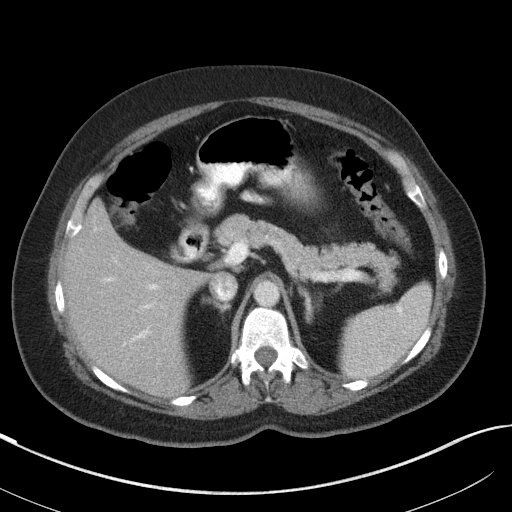
[im 70/102  bone]
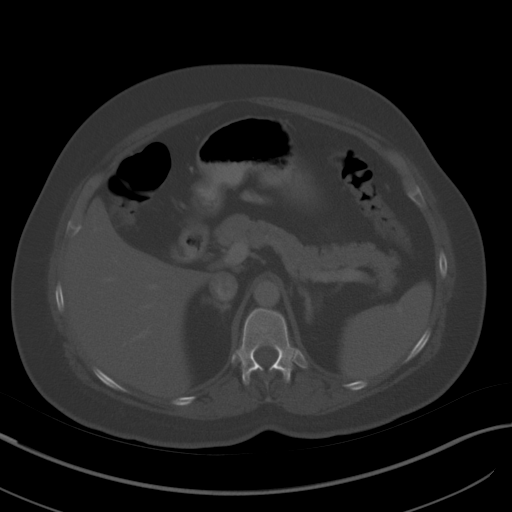
[im 80/102  soft-tissue]
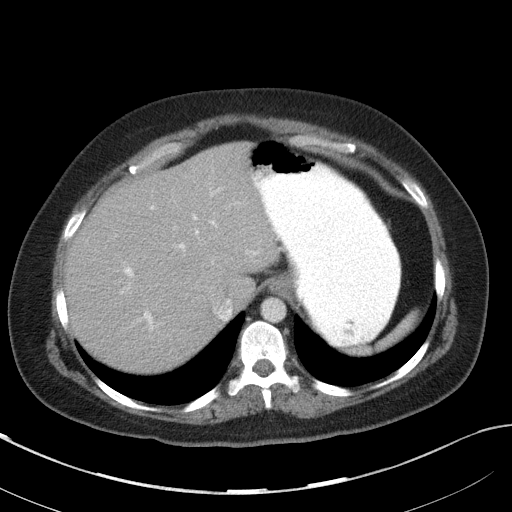
[im 86/102  soft-tissue]
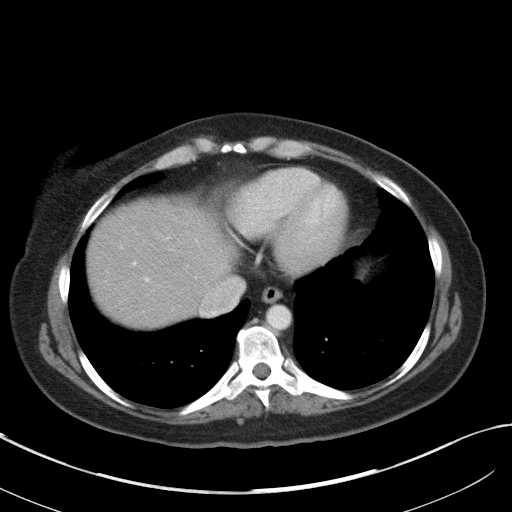
[im 96/102  soft-tissue]
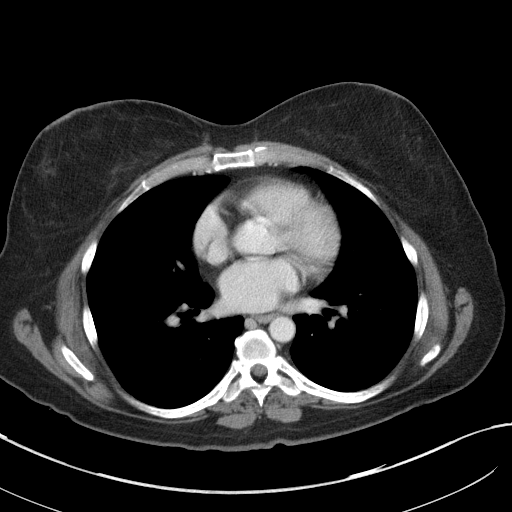

[Series 5: abd/pelvis 3.0 coronal · coronal · 0.77mm/px · 3 of 97 slices shown]
[im 33/97  soft-tissue]
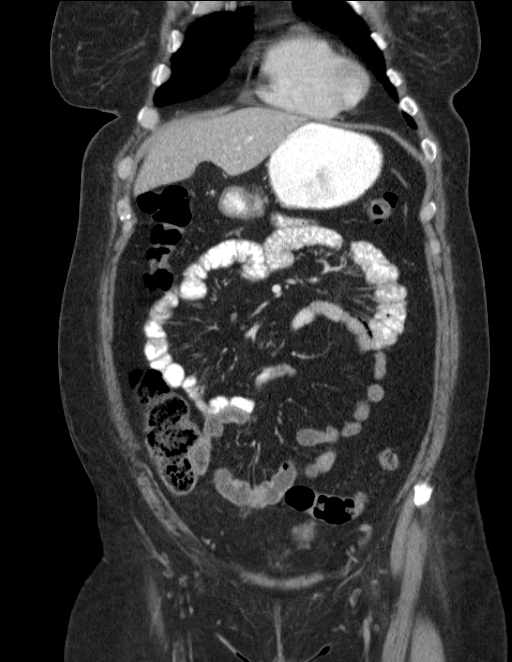
[im 43/97  soft-tissue]
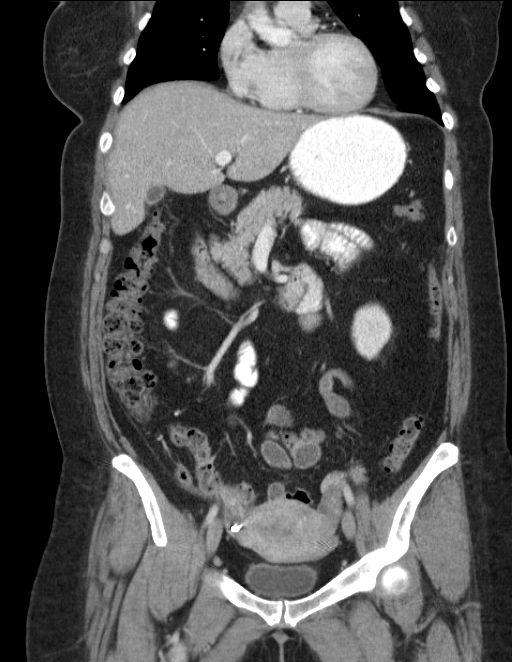
[im 54/97  soft-tissue]
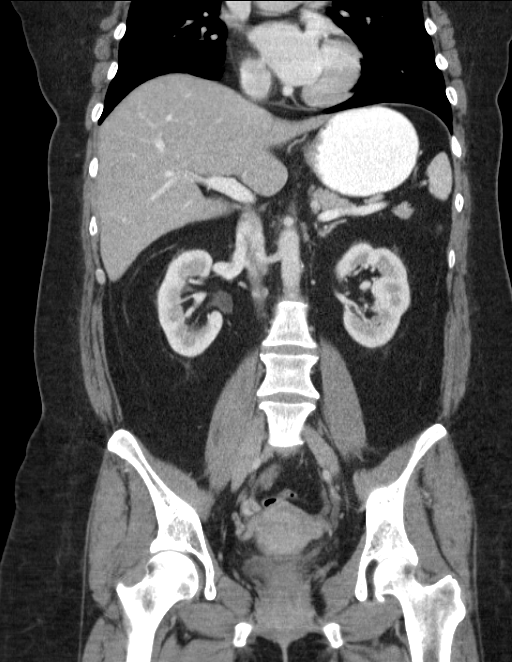

[15 of 46 positions shown; findings below may reference images not displayed]

IMPRESSION: A 1.3 mm nodular opacity in the central left breast is nonspecific
by CT scan.  Recommend correlation with routine mammography.

These results were called by telephone on 10/23/2012 at [DATE] p.m.
to Tiger, who verbally acknowledged these results.
FINDINGS: Lower Chest:  Nonspecific 1.3 cm soft tissue density in the left
central breast on the cephalad most image is nonspecific by CT.
The lungs are clear.  Visualized cardiac structures within normal
limits for size.  No pericardial effusion.  Unremarkable distal
thoracic esophagus.

Abdomen: Unremarkable CT appearance of the stomach, duodenum,
spleen, adrenal glands and pancreas.  Stable 1.5 cm low attenuation
lesion in the periphery of the right liver with peripheral
incomplete nodular enhancement consistent with a hemangioma.
Gallbladder is unremarkable. No intra or extrahepatic biliary
ductal dilatation.

No nephrolithiasis or hydronephrosis.  Symmetric parenchymal
enhancement of the bilateral kidneys.  No solid lesion. Stable sub
centimeter cystic lesion exophytic from the interpolar left kidney.
Given linear 1-year stability compared to the prior study, this is
highly likely benign cyst.

Normal-caliber large and small bowel throughout the abdomen.  No
evidence of obstruction. Appendix is not identified and appears to
be surgically absent.  Mild sacralization in the distal ileum.  No
significant inflammatory change in the terminal ileum.  No free
fluid or adenopathy.

Pelvis: Unremarkable appearance of the bladder.  Probable 3.5 cm
left fundal uterine fibroid. Small 1.7 cm fibroid projecting
anteriorly from the lower uterine segment.  Surgical changes of
prior bilateral tubal ligation.  Normal follicular cysts noted in
both kidneys.

Bones: No acute fracture or aggressive appearing lytic or blastic
osseous lesion.

Vascular: Trace atherosclerotic vascular calcification.  No
stenosis or aneurysmal dilatation.  No focal venous abnormality.
IMPRESSION: 1.  No acute abdominal or pelvic abnormality to explain the
patient's clinical symptoms.
2.  Fibroid uterus.
3.  Surgical changes suggest prior appendectomy and bilateral tubal
ligation.
4.  Stable hepatic hemangioma.
5.  Stable sub centimeter left renal lesion.  Given the neural 1-
year stability, this is highly likely a benign cyst.

## 2014-01-17 DIAGNOSIS — M25519 Pain in unspecified shoulder: Secondary | ICD-10-CM | POA: Diagnosis not present

## 2014-01-17 DIAGNOSIS — I10 Essential (primary) hypertension: Secondary | ICD-10-CM | POA: Diagnosis not present

## 2014-01-19 ENCOUNTER — Emergency Department (HOSPITAL_BASED_OUTPATIENT_CLINIC_OR_DEPARTMENT_OTHER): Payer: Medicare Other

## 2014-01-19 ENCOUNTER — Encounter (HOSPITAL_BASED_OUTPATIENT_CLINIC_OR_DEPARTMENT_OTHER): Payer: Self-pay | Admitting: Emergency Medicine

## 2014-01-19 ENCOUNTER — Emergency Department (HOSPITAL_BASED_OUTPATIENT_CLINIC_OR_DEPARTMENT_OTHER)
Admission: EM | Admit: 2014-01-19 | Discharge: 2014-01-19 | Disposition: A | Discharge status: Home / Self Care | Payer: Medicare Other | Attending: Emergency Medicine | Admitting: Emergency Medicine

## 2014-01-19 ENCOUNTER — Ambulatory Visit: Payer: Medicare Other | Admitting: Family Medicine

## 2014-01-19 DIAGNOSIS — E785 Hyperlipidemia, unspecified: Secondary | ICD-10-CM | POA: Diagnosis not present

## 2014-01-19 DIAGNOSIS — L988 Other specified disorders of the skin and subcutaneous tissue: Secondary | ICD-10-CM | POA: Insufficient documentation

## 2014-01-19 DIAGNOSIS — M25519 Pain in unspecified shoulder: Secondary | ICD-10-CM | POA: Diagnosis not present

## 2014-01-19 DIAGNOSIS — Z880 Allergy status to penicillin: Secondary | ICD-10-CM | POA: Diagnosis not present

## 2014-01-19 DIAGNOSIS — IMO0002 Reserved for concepts with insufficient information to code with codable children: Secondary | ICD-10-CM | POA: Diagnosis not present

## 2014-01-19 DIAGNOSIS — G8929 Other chronic pain: Secondary | ICD-10-CM | POA: Diagnosis not present

## 2014-01-19 DIAGNOSIS — Z87891 Personal history of nicotine dependence: Secondary | ICD-10-CM | POA: Diagnosis not present

## 2014-01-19 DIAGNOSIS — Z9889 Other specified postprocedural states: Secondary | ICD-10-CM | POA: Insufficient documentation

## 2014-01-19 DIAGNOSIS — Z79899 Other long term (current) drug therapy: Secondary | ICD-10-CM | POA: Insufficient documentation

## 2014-01-19 DIAGNOSIS — R0789 Other chest pain: Secondary | ICD-10-CM | POA: Diagnosis not present

## 2014-01-19 DIAGNOSIS — G4730 Sleep apnea, unspecified: Secondary | ICD-10-CM | POA: Diagnosis not present

## 2014-01-19 DIAGNOSIS — R079 Chest pain, unspecified: Secondary | ICD-10-CM | POA: Insufficient documentation

## 2014-01-19 DIAGNOSIS — Z85828 Personal history of other malignant neoplasm of skin: Secondary | ICD-10-CM | POA: Diagnosis not present

## 2014-01-19 DIAGNOSIS — K219 Gastro-esophageal reflux disease without esophagitis: Secondary | ICD-10-CM | POA: Diagnosis not present

## 2014-01-19 DIAGNOSIS — I10 Essential (primary) hypertension: Secondary | ICD-10-CM | POA: Insufficient documentation

## 2014-01-19 DIAGNOSIS — F411 Generalized anxiety disorder: Secondary | ICD-10-CM | POA: Insufficient documentation

## 2014-01-19 DIAGNOSIS — Z8614 Personal history of Methicillin resistant Staphylococcus aureus infection: Secondary | ICD-10-CM | POA: Insufficient documentation

## 2014-01-19 DIAGNOSIS — J45909 Unspecified asthma, uncomplicated: Secondary | ICD-10-CM | POA: Diagnosis not present

## 2014-01-19 DIAGNOSIS — Z862 Personal history of diseases of the blood and blood-forming organs and certain disorders involving the immune mechanism: Secondary | ICD-10-CM | POA: Insufficient documentation

## 2014-01-19 DIAGNOSIS — G35 Multiple sclerosis: Secondary | ICD-10-CM | POA: Diagnosis not present

## 2014-01-19 LAB — TROPONIN I: Troponin I: <0.3 ng/mL (ref <0.30)

## 2014-01-19 NOTE — ED Provider Notes (Signed)
Medical screening examination/treatment/procedure(s) were performed by non-physician practitioner and as supervising physician I was immediately available for consultation/collaboration.   EKG Interpretation   Date/Time:  Monday January 19 2014 17:49:48 EDT Ventricular Rate:  86 PR Interval:  114 QRS Duration: 76 QT Interval:  346 QTC Calculation: 414 R Axis:   49 Text Interpretation:  Normal sinus rhythm Normal ECG Confirmed by WARD,   DO, KRISTEN ([id number]) on 01/19/2014 5:52:52 PM        Dawson, DO 01/19/14 2321
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
## 2014-01-19 NOTE — Discharge Instructions (Signed)
Your EKG, Chest x-ray and blood work today are normal. Follow up with your doctor tomorrow to recheck the shoulder. Apply antibiotic ointment to the red area on your shoulder. Return as needed for problems.

## 2014-01-19 NOTE — ED Notes (Signed)
Pt. Has recently had shoulder procedure last Wednesday for calcified tendon in the L shoulder.  Pt. Reports shoulder pain in L and chest pain in L.   Pt. Reports she has taken tylenol that did help.Jennifer [name]

## 2014-01-19 NOTE — ED Notes (Signed)
Patient transported to X-ray 

## 2014-01-19 NOTE — ED Notes (Signed)
States she was diagnosed with MS on Friday. Here today with a red spot on her left axillary with pain that radiates into her chest.

## 2014-01-20 DIAGNOSIS — F4310 Post-traumatic stress disorder, unspecified: Secondary | ICD-10-CM | POA: Diagnosis not present

## 2014-01-21 DIAGNOSIS — G35 Multiple sclerosis: Secondary | ICD-10-CM | POA: Diagnosis not present

## 2014-01-21 DIAGNOSIS — R490 Dysphonia: Secondary | ICD-10-CM | POA: Diagnosis not present

## 2014-01-21 DIAGNOSIS — R1310 Dysphagia, unspecified: Secondary | ICD-10-CM | POA: Diagnosis not present

## 2014-01-22 ENCOUNTER — Telehealth: Payer: Self-pay | Admitting: Cardiology

## 2014-01-22 DIAGNOSIS — I10 Essential (primary) hypertension: Secondary | ICD-10-CM | POA: Diagnosis not present

## 2014-01-22 DIAGNOSIS — J00 Acute nasopharyngitis [common cold]: Secondary | ICD-10-CM | POA: Diagnosis not present

## 2014-01-22 DIAGNOSIS — K7689 Other specified diseases of liver: Secondary | ICD-10-CM | POA: Diagnosis not present

## 2014-01-22 DIAGNOSIS — J029 Acute pharyngitis, unspecified: Secondary | ICD-10-CM | POA: Diagnosis not present

## 2014-01-22 DIAGNOSIS — Z8673 Personal history of transient ischemic attack (TIA), and cerebral infarction without residual deficits: Secondary | ICD-10-CM | POA: Diagnosis not present

## 2014-01-22 DIAGNOSIS — G8929 Other chronic pain: Secondary | ICD-10-CM | POA: Diagnosis not present

## 2014-01-22 DIAGNOSIS — R05 Cough: Secondary | ICD-10-CM | POA: Diagnosis not present

## 2014-01-22 DIAGNOSIS — R059 Cough, unspecified: Secondary | ICD-10-CM | POA: Diagnosis not present

## 2014-01-22 DIAGNOSIS — K2270 Barrett's esophagus without dysplasia: Secondary | ICD-10-CM | POA: Diagnosis not present

## 2014-01-22 DIAGNOSIS — G35 Multiple sclerosis: Secondary | ICD-10-CM | POA: Diagnosis not present

## 2014-01-22 NOTE — Telephone Encounter (Signed)
Pts final monitor report is on your desk.

## 2014-01-22 NOTE — Telephone Encounter (Signed)
New message    1. Patient calling for Holter monitor test results done on  4/13.   2. Would like to be set up for sleep study.

## 2014-01-22 NOTE — Telephone Encounter (Signed)
I do not follow her CPAP so she will need to address this with whoever did her initial study and ordered her CPAP.  In regards to her heart monitor please find out from [facility] if she ever returned it

## 2014-01-22 NOTE — Telephone Encounter (Signed)
Patient states recently diagnosed with seizures and MS. Requesting results of holter monitor worn in April. Unable to see results, will route to Dr. Radford Pax to advise. Also, patient had home sleep study in April and now has a CPAP which she is having trouble tolerating. It causes her to have chest pain, "feels like to much pressure". Requesting repeat study at a sleep center.

## 2014-01-22 NOTE — Telephone Encounter (Signed)
Pt mailed monitor back around 12/31/13. I will check with Valetta Fuller to see where report is. Pt will check with PCP regarding sleep study and for someone to follow her.

## 2014-01-23 ENCOUNTER — Encounter: Payer: Self-pay | Admitting: Family Medicine

## 2014-01-23 ENCOUNTER — Encounter: Payer: Self-pay | Admitting: Sports Medicine

## 2014-01-23 ENCOUNTER — Ambulatory Visit (INDEPENDENT_AMBULATORY_CARE_PROVIDER_SITE_OTHER): Payer: Medicare Other | Admitting: Family Medicine

## 2014-01-23 ENCOUNTER — Ambulatory Visit (INDEPENDENT_AMBULATORY_CARE_PROVIDER_SITE_OTHER): Payer: Medicare Other | Admitting: Sports Medicine

## 2014-01-23 VITALS — BP 153/89 | HR 123 | Ht 62.0 in | Wt 185.0 lb

## 2014-01-23 DIAGNOSIS — G4730 Sleep apnea, unspecified: Secondary | ICD-10-CM

## 2014-01-23 DIAGNOSIS — M7532 Calcific tendinitis of left shoulder: Secondary | ICD-10-CM

## 2014-01-23 DIAGNOSIS — R0981 Nasal congestion: Secondary | ICD-10-CM

## 2014-01-23 DIAGNOSIS — J3489 Other specified disorders of nose and nasal sinuses: Secondary | ICD-10-CM | POA: Diagnosis not present

## 2014-01-23 DIAGNOSIS — G35 Multiple sclerosis: Secondary | ICD-10-CM

## 2014-01-23 DIAGNOSIS — I10 Essential (primary) hypertension: Secondary | ICD-10-CM | POA: Diagnosis not present

## 2014-01-23 DIAGNOSIS — M7530 Calcific tendinitis of unspecified shoulder: Secondary | ICD-10-CM

## 2014-01-23 DIAGNOSIS — R1310 Dysphagia, unspecified: Secondary | ICD-10-CM | POA: Diagnosis not present

## 2014-01-23 MED ORDER — NITROGLYCERIN 0.2 MG/HR TD PT24: MEDICATED_PATCH | TRANSDERMAL | Status: DC

## 2014-01-23 NOTE — Progress Notes (Signed)
Subjective:    Patient ID: Jennifer [name], female    DOB: [birth year], 48 y.o.   MRN: [id number]  HPI  Here today for blood pressure followup. When I saw her about 3 weeks ago she was having severe persistent headaches after having had a spinal tap that had a week. Her blood pressure was also significantly elevated that day. She has still not taken her BP meds.  She actually meets with Dr. Ciro Backer, her psychiatrist next week adn hopes he can help with the.  Because of her list of intolerances to medication she's very nervous about starting something new. She has tolerated my card his amlodipine fairly well. The she still feels like it tends to cause tachycardia. She has not tried the [place]  Sleep apnea - says wasn't able to tolerate CPAP. It was humidified but caused upper chest pain so wants to try Bipap. We need to be set up another titration study.  Spoke with her ENT about it but he didn't have a suggestion.  He did mention that her tongue was fairly large for the size of her mouth and wonders if that could actually be contributing to sleep apnea. They did not discuss any type of oral appliance.  We discussed her new dx of MS. She has episodes of weakness and difficulty walking.  Her neurologist had encouraged her to contact [employer] for additional information and they have mail her a packet. She brought it in with her today. He also recommend name of several medications that are options for treatment. She wanted to know what I thought about it. He is concerned because she does have a history of fatty liver. And one of the medications can potentially cause chest pain which she is very worried about. She is concerned about her new diagnosis.  She's also had some nasal congestion. She did use her nasal saline and said she got some thick yellow discharge with a little bit of blood out it actually feels a little bit better this morning.  Review of Systems     Objective:   Physical Exam   Constitutional: She is oriented to person, place, and time. She appears well-developed and well-nourished.  HENT:  Head: Normocephalic and atraumatic.  Nose: Nose normal.  Nares are mildly erythematous. No lesions sores. No discharge.  Cardiovascular: Normal rate, regular rhythm and normal heart sounds.   Pulmonary/Chest: Effort normal and breath sounds normal.  Neurological: She is alert and oriented to person, place, and time.  Skin: Skin is warm and dry.  Psychiatric: She has a normal mood and affect. Her behavior is normal.          Assessment & Plan:  HTN - uncontrolled. Still encouraged her to start one of the medications that she at least partially tolerates and then let me actually adjust the dose. She says she has tried them are Cardizem felt like it was not effective but I reminded her that sometimes it takes titrating to a specific dose to see significant results.  Sleep apnea - work on getting her a titration study for BiPAP to see if it's more comfortable. She requested the [facility] location but they will not take payments. Thus we'll try scheduling it at [facility].   MS-she is yet to go through the information. Encouraged her to take her time and write down questions that she has a [employer] piece of paper. As she continues to get more information and get answers your questions and she  can comprise a shorter list to go over with her neurologist. I think this would be helpful for her. Continue to work with her psychiatrist and therapist as it can sometimes be difficult getting a diagnosis like this. Continue to evaluate medication options. Reminded her that no matter what the side effects of the drug, she won't know until she tries them.   Nasal congestion-improved. Continue nasal saline meds as needed.

## 2014-01-23 NOTE — Progress Notes (Signed)
    Subjective:    CC: Followup  HPI: Left supraspinatus calcific tendinitis: Andera returns, we performed an ultrasound guided barbotage and aspiration of the calcific deposit in her supraspinatus tendon, we injected lidocaine and Marcaine however she does tend to experience psychosis with steroids, so we are unable to use a steroid injection. She returns today and tells [place] that pain is worse than before the procedure. She is again resistant to consideration of surgical intervention. Pain is moderate, persistent.  Past medical history, Surgical history, Family history not pertinant except as noted below, Social history, Allergies, and medications have been entered into the medical record, reviewed, and no changes needed.   Review of Systems: No fevers, chills, night sweats, weight loss, chest pain, or shortness of breath.   Objective:    General: Well Developed, well nourished, and in no acute distress.  Neuro: Alert and oriented x3, extra-ocular muscles intact, sensation grossly intact.  HEENT: Normocephalic, atraumatic, pupils equal round reactive to light, neck supple, no masses, no lymphadenopathy, thyroid nonpalpable.  Skin: Warm and dry, no rashes. Cardiac: Regular rate and rhythm, no murmurs rubs or gallops, no lower extremity edema. Respiratory: Clear to auscultation bilaterally. Not using accessory muscles, speaking in full sentences.  Impression and Recommendations:

## 2014-01-23 NOTE — Telephone Encounter (Signed)
lmtrc

## 2014-01-23 NOTE — Assessment & Plan Note (Signed)
Unfortunately we are not able to use corticosteroids in [place]. She did not respond favorably to calcific tendinitis barbotaged with lidocaine and Marcaine. At this point she continues to be resistant to surgical intervention so there is some evidence for nitroglycerin patches compared to placebo for rotator cuff tendinitis. We will try physical therapy and topical nitroglycerin patches, we will likely need to try this for 2 months. Return in 2 months.

## 2014-01-23 NOTE — Telephone Encounter (Signed)
Please let patient know that heart monitor was fine with occasional extra heart beats from the top of her heart which are benign

## 2014-01-23 NOTE — Telephone Encounter (Signed)
Error

## 2014-01-26 ENCOUNTER — Encounter: Payer: Self-pay | Admitting: General Surgery

## 2014-01-26 ENCOUNTER — Telehealth: Payer: Self-pay | Admitting: Family Medicine

## 2014-01-26 DIAGNOSIS — K219 Gastro-esophageal reflux disease without esophagitis: Secondary | ICD-10-CM | POA: Diagnosis not present

## 2014-01-26 DIAGNOSIS — G35 Multiple sclerosis: Secondary | ICD-10-CM | POA: Diagnosis not present

## 2014-01-26 DIAGNOSIS — E785 Hyperlipidemia, unspecified: Secondary | ICD-10-CM | POA: Diagnosis not present

## 2014-01-26 DIAGNOSIS — Z888 Allergy status to other drugs, medicaments and biological substances status: Secondary | ICD-10-CM | POA: Diagnosis not present

## 2014-01-26 DIAGNOSIS — R51 Headache: Secondary | ICD-10-CM | POA: Diagnosis not present

## 2014-01-26 DIAGNOSIS — Z8541 Personal history of malignant neoplasm of cervix uteri: Secondary | ICD-10-CM | POA: Diagnosis not present

## 2014-01-26 DIAGNOSIS — F411 Generalized anxiety disorder: Secondary | ICD-10-CM | POA: Diagnosis not present

## 2014-01-26 DIAGNOSIS — Z880 Allergy status to penicillin: Secondary | ICD-10-CM | POA: Diagnosis not present

## 2014-01-26 DIAGNOSIS — R4182 Altered mental status, unspecified: Secondary | ICD-10-CM | POA: Diagnosis not present

## 2014-01-26 DIAGNOSIS — Z87891 Personal history of nicotine dependence: Secondary | ICD-10-CM | POA: Diagnosis not present

## 2014-01-26 DIAGNOSIS — I10 Essential (primary) hypertension: Secondary | ICD-10-CM | POA: Diagnosis not present

## 2014-01-26 DIAGNOSIS — Z79899 Other long term (current) drug therapy: Secondary | ICD-10-CM | POA: Diagnosis not present

## 2014-01-26 DIAGNOSIS — Z8669 Personal history of other diseases of the nervous system and sense organs: Secondary | ICD-10-CM | POA: Diagnosis not present

## 2014-01-26 DIAGNOSIS — Z885 Allergy status to narcotic agent status: Secondary | ICD-10-CM | POA: Diagnosis not present

## 2014-01-26 DIAGNOSIS — Z8542 Personal history of malignant neoplasm of other parts of uterus: Secondary | ICD-10-CM | POA: Diagnosis not present

## 2014-01-26 DIAGNOSIS — Z882 Allergy status to sulfonamides status: Secondary | ICD-10-CM | POA: Diagnosis not present

## 2014-01-26 NOTE — Telephone Encounter (Signed)
lmtrc

## 2014-01-26 NOTE — Telephone Encounter (Signed)
Letter sent to pt to make aware.

## 2014-01-26 NOTE — Telephone Encounter (Signed)
Please call for more detail

## 2014-01-26 NOTE — Telephone Encounter (Signed)
Pt called. She is complaining of headache from hair line going back since yesterday.

## 2014-01-27 NOTE — Telephone Encounter (Signed)
Called pt and lvm for return call.Jennifer [name]

## 2014-01-28 DIAGNOSIS — Z79899 Other long term (current) drug therapy: Secondary | ICD-10-CM | POA: Diagnosis not present

## 2014-01-28 DIAGNOSIS — K219 Gastro-esophageal reflux disease without esophagitis: Secondary | ICD-10-CM | POA: Diagnosis not present

## 2014-01-28 DIAGNOSIS — Z8049 Family history of malignant neoplasm of other genital organs: Secondary | ICD-10-CM | POA: Diagnosis not present

## 2014-01-28 DIAGNOSIS — J45909 Unspecified asthma, uncomplicated: Secondary | ICD-10-CM | POA: Diagnosis not present

## 2014-01-28 DIAGNOSIS — Z87891 Personal history of nicotine dependence: Secondary | ICD-10-CM | POA: Diagnosis not present

## 2014-01-28 DIAGNOSIS — G43909 Migraine, unspecified, not intractable, without status migrainosus: Secondary | ICD-10-CM | POA: Diagnosis not present

## 2014-01-28 DIAGNOSIS — L559 Sunburn, unspecified: Secondary | ICD-10-CM | POA: Diagnosis not present

## 2014-01-28 DIAGNOSIS — Z882 Allergy status to sulfonamides status: Secondary | ICD-10-CM | POA: Diagnosis not present

## 2014-01-28 DIAGNOSIS — Z885 Allergy status to narcotic agent status: Secondary | ICD-10-CM | POA: Diagnosis not present

## 2014-01-28 DIAGNOSIS — Z880 Allergy status to penicillin: Secondary | ICD-10-CM | POA: Diagnosis not present

## 2014-01-28 DIAGNOSIS — G35 Multiple sclerosis: Secondary | ICD-10-CM | POA: Diagnosis not present

## 2014-01-28 DIAGNOSIS — R569 Unspecified convulsions: Secondary | ICD-10-CM | POA: Diagnosis not present

## 2014-01-28 DIAGNOSIS — Z886 Allergy status to analgesic agent status: Secondary | ICD-10-CM | POA: Diagnosis not present

## 2014-01-28 DIAGNOSIS — E269 Hyperaldosteronism, unspecified: Secondary | ICD-10-CM | POA: Diagnosis not present

## 2014-01-28 DIAGNOSIS — R1310 Dysphagia, unspecified: Secondary | ICD-10-CM | POA: Diagnosis not present

## 2014-01-28 DIAGNOSIS — Z888 Allergy status to other drugs, medicaments and biological substances status: Secondary | ICD-10-CM | POA: Diagnosis not present

## 2014-01-28 DIAGNOSIS — F411 Generalized anxiety disorder: Secondary | ICD-10-CM | POA: Diagnosis not present

## 2014-01-28 NOTE — Telephone Encounter (Signed)
Pt called and stated that she is still having the HA. She stated that she was out in the pool yesterday and thinks that she may not have been hydrated enough and this could be a contributing factor to her HA. She has taken tylenol and it hasn't helped she doesn't know if this is from sinus pressure. She has not been drinking any caffeine and this will be day 4 that she has been suffering with this. No vomiting but she does have some nausea, visual changes. No appt on 6/18 and only acute on 6/19  Pt also reports that the ppl from the home sleep study called her to set up doing another sleep study since she cannot tolerate the CPAP. She also stated that [facility] called to set up the sleep study also and is unsure of what she is supposed to do. Please advise.Jennifer [name] [place]

## 2014-01-29 NOTE — Telephone Encounter (Signed)
Can she take IBUprofen for her headaches.  Or try 2 tyelnol.  May have been aggrevated by being out in the heat yesterday. Make sure hydrateing well today.    Can the home study people do a Bipap titration sleep study?  Id didn't think they could so that is why set up at [facility]

## 2014-02-02 DIAGNOSIS — G35 Multiple sclerosis: Secondary | ICD-10-CM | POA: Diagnosis not present

## 2014-02-02 DIAGNOSIS — R42 Dizziness and giddiness: Secondary | ICD-10-CM | POA: Diagnosis not present

## 2014-02-02 DIAGNOSIS — Z8673 Personal history of transient ischemic attack (TIA), and cerebral infarction without residual deficits: Secondary | ICD-10-CM | POA: Diagnosis not present

## 2014-02-02 DIAGNOSIS — R0602 Shortness of breath: Secondary | ICD-10-CM | POA: Diagnosis not present

## 2014-02-02 DIAGNOSIS — R1310 Dysphagia, unspecified: Secondary | ICD-10-CM | POA: Diagnosis not present

## 2014-02-02 DIAGNOSIS — E876 Hypokalemia: Secondary | ICD-10-CM | POA: Diagnosis not present

## 2014-02-02 DIAGNOSIS — Z79899 Other long term (current) drug therapy: Secondary | ICD-10-CM | POA: Diagnosis not present

## 2014-02-02 DIAGNOSIS — K222 Esophageal obstruction: Secondary | ICD-10-CM | POA: Diagnosis not present

## 2014-02-02 DIAGNOSIS — R6889 Other general symptoms and signs: Secondary | ICD-10-CM | POA: Diagnosis not present

## 2014-02-02 DIAGNOSIS — F4310 Post-traumatic stress disorder, unspecified: Secondary | ICD-10-CM | POA: Diagnosis not present

## 2014-02-02 DIAGNOSIS — I10 Essential (primary) hypertension: Secondary | ICD-10-CM | POA: Diagnosis not present

## 2014-02-03 ENCOUNTER — Other Ambulatory Visit: Payer: Self-pay | Admitting: Sports Medicine

## 2014-02-03 ENCOUNTER — Ambulatory Visit (INDEPENDENT_AMBULATORY_CARE_PROVIDER_SITE_OTHER): Payer: Medicare Other | Admitting: Family Medicine

## 2014-02-03 ENCOUNTER — Encounter: Payer: Self-pay | Admitting: Family Medicine

## 2014-02-03 VITALS — BP 144/86 | HR 100 | Temp 98.2°F | Ht 62.0 in | Wt 186.0 lb

## 2014-02-03 DIAGNOSIS — M25512 Pain in left shoulder: Secondary | ICD-10-CM

## 2014-02-03 DIAGNOSIS — M25519 Pain in unspecified shoulder: Secondary | ICD-10-CM

## 2014-02-03 DIAGNOSIS — J018 Other acute sinusitis: Secondary | ICD-10-CM

## 2014-02-03 DIAGNOSIS — J329 Chronic sinusitis, unspecified: Secondary | ICD-10-CM

## 2014-02-03 DIAGNOSIS — G4733 Obstructive sleep apnea (adult) (pediatric): Secondary | ICD-10-CM | POA: Diagnosis not present

## 2014-02-03 DIAGNOSIS — M7532 Calcific tendinitis of left shoulder: Secondary | ICD-10-CM

## 2014-02-03 MED ORDER — DOXYCYCLINE HYCLATE 100 MG PO TABS: 50.0000 mg | ORAL_TABLET | Freq: Two times a day (BID) | ORAL | Status: DC

## 2014-02-03 NOTE — Progress Notes (Addendum)
Subjective:    Patient ID: Jennifer [name], female    DOB: [birth year], 48 y.o.   MRN: [id number]  HPI HA and nasal congestion x 2 days. Has been using tylenol and affrin OTC. Still getting choking spells but her swallowing studies have been normal.  Her ENT that she has now doesn't do the botox injections.  She has been more nasally.  They have found mold in her closet at home  Her husband is going to try to treat it with bleach.  Throat has been swollen as well. No fevers chills or sweats.  Still needs evaluation for Bipap and still having hard time getting an appointment set up.  She had her sleep study done but was unable to tolerate the CPAP. She [facility]. She's not currently using it. After talking with Rep. they have recommended maybe a trial of BiPAP as it might be more comfortable for her. We'll need to have a second study done to titrate for BiPAP. He would prefer to do this at home versus 3 a sleep center if at all possible.  She would also like to discuss her left shoulder pain. She did see Dr. Dianah Field. They did aspiration of the calcific tendon. She has significant pain for the first couple days afterwards. She does have improved range of motion now. The still has a lot of discomfort and pain. They had evidently discussed physical therapy and possible MRI. She says now the pain has become more persistent. It is now difficult to sleep on that shoulder. She's given it some thought and has decided she does not want to do physical therapy. She's done it several times and feels like it's not been very helpful for her. She would like to move forward with an MRI. Review of Systems     Objective:   Physical Exam  Constitutional: She is oriented to person, place, and time. She appears well-developed and well-nourished.  HENT:  Head: Normocephalic and atraumatic.  Right Ear: External ear normal.  Left Ear: External ear normal.  Nose:  Nose normal.  Mouth/Throat: Oropharynx is clear and moist.  TMs and canals are clear.   Eyes: Conjunctivae and EOM are normal. Pupils are equal, round, and reactive to light.  Neck: Neck supple. No thyromegaly present.  Cardiovascular: Normal rate, regular rhythm and normal heart sounds.   Pulmonary/Chest: Effort normal and breath sounds normal. She has no wheezes.  Lymphadenopathy:    She has no cervical adenopathy.  Neurological: She is alert and oriented to person, place, and time.  Skin: Skin is warm and dry.  Psychiatric: She has a normal mood and affect.          Assessment & Plan:  Acute sinsusitis - continue symptomatic care including her Tylenol and when necessary Afrin. They don't recommend using for more than 3 days already. She has restarted her nasal steroid spray and has started her nasal saline rinses again. We initially stopped but these because she was getting a lot of sinus rotation. will tx with doxy. Will refer to [facility] for recurrent sinus infections. Has been seen there before but needs referral.    [facility] - will try to schedule with home study.  We'll see if we can get this arranged today. She prefers to go back for his not diagnostics. Will place near for lung that scheduled today.  Left shoulder pain - She has really been thinking about the PT  and has done it multiple times and really thinks wants to move forward with MRI.  I discussed with her that the only reason to get an MRI is that she is willing to consider surgical options for her shoulder. She is ready for that then we could certainly consider it. I will send a note to Dr. Dianah Field and see what he would like to do. I did let her know that he may want to see her back before ordering the MRI. She prefers to have any imaging done at [facility].

## 2014-02-03 NOTE — Addendum Note (Signed)
Addended by: Beatrice Lecher D on: 02/03/2014 02:29 PM   Modules accepted: Orders, Level of Service

## 2014-02-03 NOTE — Assessment & Plan Note (Signed)
Worsening pain after barbotage of calcific tendinitis of the supraspinatus.  No improvement physical therapy, topical nitroglycerin. 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 At this point we are going to proceed with MRI for arthroscopy planning, patient will return to see me to go over results of the MRI.

## 2014-02-04 ENCOUNTER — Telehealth: Payer: Self-pay | Admitting: Family Medicine

## 2014-02-04 DIAGNOSIS — I10 Essential (primary) hypertension: Secondary | ICD-10-CM | POA: Diagnosis not present

## 2014-02-04 DIAGNOSIS — R002 Palpitations: Secondary | ICD-10-CM | POA: Diagnosis not present

## 2014-02-04 DIAGNOSIS — R0602 Shortness of breath: Secondary | ICD-10-CM | POA: Diagnosis not present

## 2014-02-04 DIAGNOSIS — Z87891 Personal history of nicotine dependence: Secondary | ICD-10-CM | POA: Diagnosis not present

## 2014-02-04 DIAGNOSIS — R079 Chest pain, unspecified: Secondary | ICD-10-CM | POA: Diagnosis not present

## 2014-02-04 DIAGNOSIS — Z8542 Personal history of malignant neoplasm of other parts of uterus: Secondary | ICD-10-CM | POA: Diagnosis not present

## 2014-02-04 NOTE — Telephone Encounter (Signed)
Dr T pt called. She would like referral placed for MRI  left shoulder down to elbow and for her to be seen at  [place] and not [facility].  Thank you.

## 2014-02-05 ENCOUNTER — Emergency Department (HOSPITAL_BASED_OUTPATIENT_CLINIC_OR_DEPARTMENT_OTHER)
Admission: EM | Admit: 2014-02-05 | Discharge: 2014-02-06 | Disposition: A | Discharge status: Home / Self Care | Payer: Medicare Other | Attending: Emergency Medicine | Admitting: Emergency Medicine

## 2014-02-05 ENCOUNTER — Encounter (HOSPITAL_BASED_OUTPATIENT_CLINIC_OR_DEPARTMENT_OTHER): Payer: Self-pay | Admitting: Emergency Medicine

## 2014-02-05 ENCOUNTER — Other Ambulatory Visit: Payer: Self-pay

## 2014-02-05 DIAGNOSIS — Z8659 Personal history of other mental and behavioral disorders: Secondary | ICD-10-CM | POA: Insufficient documentation

## 2014-02-05 DIAGNOSIS — Z87891 Personal history of nicotine dependence: Secondary | ICD-10-CM | POA: Insufficient documentation

## 2014-02-05 DIAGNOSIS — Z9889 Other specified postprocedural states: Secondary | ICD-10-CM | POA: Insufficient documentation

## 2014-02-05 DIAGNOSIS — R002 Palpitations: Secondary | ICD-10-CM | POA: Diagnosis not present

## 2014-02-05 DIAGNOSIS — Z79899 Other long term (current) drug therapy: Secondary | ICD-10-CM | POA: Insufficient documentation

## 2014-02-05 DIAGNOSIS — J45909 Unspecified asthma, uncomplicated: Secondary | ICD-10-CM | POA: Diagnosis not present

## 2014-02-05 DIAGNOSIS — R079 Chest pain, unspecified: Secondary | ICD-10-CM | POA: Diagnosis not present

## 2014-02-05 DIAGNOSIS — G4730 Sleep apnea, unspecified: Secondary | ICD-10-CM | POA: Diagnosis not present

## 2014-02-05 DIAGNOSIS — Z880 Allergy status to penicillin: Secondary | ICD-10-CM | POA: Insufficient documentation

## 2014-02-05 DIAGNOSIS — Z792 Long term (current) use of antibiotics: Secondary | ICD-10-CM | POA: Insufficient documentation

## 2014-02-05 DIAGNOSIS — Z9981 Dependence on supplemental oxygen: Secondary | ICD-10-CM | POA: Insufficient documentation

## 2014-02-05 DIAGNOSIS — Z8544 Personal history of malignant neoplasm of other female genital organs: Secondary | ICD-10-CM | POA: Insufficient documentation

## 2014-02-05 DIAGNOSIS — Z8542 Personal history of malignant neoplasm of other parts of uterus: Secondary | ICD-10-CM | POA: Insufficient documentation

## 2014-02-05 DIAGNOSIS — Z8639 Personal history of other endocrine, nutritional and metabolic disease: Secondary | ICD-10-CM | POA: Insufficient documentation

## 2014-02-05 DIAGNOSIS — K219 Gastro-esophageal reflux disease without esophagitis: Secondary | ICD-10-CM | POA: Insufficient documentation

## 2014-02-05 DIAGNOSIS — R0789 Other chest pain: Secondary | ICD-10-CM | POA: Insufficient documentation

## 2014-02-05 DIAGNOSIS — G8929 Other chronic pain: Secondary | ICD-10-CM | POA: Insufficient documentation

## 2014-02-05 DIAGNOSIS — I10 Essential (primary) hypertension: Secondary | ICD-10-CM | POA: Insufficient documentation

## 2014-02-05 DIAGNOSIS — Z862 Personal history of diseases of the blood and blood-forming organs and certain disorders involving the immune mechanism: Secondary | ICD-10-CM | POA: Diagnosis not present

## 2014-02-05 DIAGNOSIS — Z8614 Personal history of Methicillin resistant Staphylococcus aureus infection: Secondary | ICD-10-CM | POA: Diagnosis not present

## 2014-02-05 NOTE — ED Notes (Signed)
MD at bedside. 

## 2014-02-05 NOTE — ED Notes (Signed)
Pt states she became hot and sweaty, was painting her nails, states laid down and her heart started flip flopping. Pt in no distress. EKG done on arrival

## 2014-02-05 NOTE — Telephone Encounter (Signed)
MRI has already been ordered, let's just have it scheduled at [facility]
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
## 2014-02-06 LAB — BASIC METABOLIC PANEL
BUN: 18 mg/dL (ref 6–23)
CO2: 22 mEq/L (ref 19–32)
Calcium: 9.2 mg/dL (ref 8.4–10.5)
Chloride: 102 mEq/L (ref 96–112)
Creatinine, Ser: 0.7 mg/dL (ref 0.50–1.10)
GFR calc Af Amer: >90 mL/min (ref >90)
GFR calc non Af Amer: >90 mL/min (ref >90)
Glucose, Bld: 190 mg/dL — ABNORMAL HIGH (ref 70–99)
Potassium: 3.4 mEq/L — ABNORMAL LOW (ref 3.7–5.3)
Sodium: 140 mEq/L (ref 137–147)

## 2014-02-06 LAB — TROPONIN I: Troponin I: <0.3 ng/mL (ref <0.30)

## 2014-02-06 NOTE — ED Notes (Signed)
MD at bedside discussing test results and dispo plan.

## 2014-02-06 NOTE — Discharge Instructions (Signed)
Followup with your primary Dr. to discuss a Holter monitor.   Chest Pain (Nonspecific) It is often hard to give a specific diagnosis for the cause of chest pain. There is always a chance that your pain could be related to something serious, such as a heart attack or a blood clot in the lungs. You need to follow up with your health care provider for further evaluation. CAUSES   Heartburn.  Pneumonia or bronchitis.  Anxiety or stress.  Inflammation around your heart (pericarditis) or lung (pleuritis or pleurisy).  A blood clot in the lung.  A collapsed lung (pneumothorax). It can develop suddenly on its own (spontaneous pneumothorax) or from trauma to the chest.  Shingles infection (herpes zoster virus). The chest wall is composed of bones, muscles, and cartilage. Any of these can be the source of the pain.  The bones can be bruised by injury.  The muscles or cartilage can be strained by coughing or overwork.  The cartilage can be affected by inflammation and become sore (costochondritis). DIAGNOSIS  Lab tests or other studies may be needed to find the cause of your pain. Your health care provider may have you take a test called an ambulatory electrocardiogram (ECG). An ECG records your heartbeat patterns over a 24-hour period. You may also have other tests, such as:  Transthoracic echocardiogram (TTE). During echocardiography, sound waves are used to evaluate how blood flows through your heart.  Transesophageal echocardiogram (TEE).  Cardiac monitoring. This allows your health care provider to monitor your heart rate and rhythm in real time.  Holter monitor. This is a portable device that records your heartbeat and can help diagnose heart arrhythmias. It allows your health care provider to track your heart activity for several days, if needed.  Stress tests by exercise or by giving medicine that makes the heart beat faster. TREATMENT   Treatment depends on what may be causing  your chest pain. Treatment may include:  Acid blockers for heartburn.  Anti-inflammatory medicine.  Pain medicine for inflammatory conditions.  Antibiotics if an infection is present.  You may be advised to change lifestyle habits. This includes stopping smoking and avoiding alcohol, caffeine, and chocolate.  You may be advised to keep your head raised (elevated) when sleeping. This reduces the chance of acid going backward from your stomach into your esophagus. Most of the time, nonspecific chest pain will improve within 2-3 days with rest and mild pain medicine.  HOME CARE INSTRUCTIONS   If antibiotics were prescribed, take them as directed. Finish them even if you start to feel better.  For the next few days, avoid physical activities that bring on chest pain. Continue physical activities as directed.  Do not use any tobacco products, including cigarettes, chewing tobacco, or electronic cigarettes.  Avoid drinking alcohol.  Only take medicine as directed by your health care provider.  Follow your health care provider's suggestions for further testing if your chest pain does not go away.  Keep any follow-up appointments you made. If you do not go to an appointment, you could develop lasting (chronic) problems with pain. If there is any problem keeping an appointment, call to reschedule. SEEK MEDICAL CARE IF:   Your chest pain does not go away, even after treatment.  You have a rash with blisters on your chest.  You have a fever. SEEK IMMEDIATE MEDICAL CARE IF:   You have increased chest pain or pain that spreads to your arm, neck, jaw, back, or abdomen.  You have  shortness of breath.  You have an increasing cough, or you cough up blood.  You have severe back or abdominal pain.  You feel nauseous or vomit.  You have severe weakness.  You faint.  You have chills. This is an emergency. Do not wait to see if the pain will go away. Get medical help at once. Call your  local emergency services (911 in [place] Do not drive yourself to the hospital. MAKE SURE YOU:   Understand these instructions.  Will watch your condition.  Will get help right away if you are not doing well or get worse. Document Released: 05/10/2005 Document Revised: 08/05/2013 Document Reviewed: 03/05/2008 [facility] Patient Information 2015 [place], [place]. This information is not intended to replace advice given to you by your health care provider. Make sure you discuss any questions you have with your health care provider.  Palpitations A palpitation is the feeling that your heartbeat is irregular or is faster than normal. It may feel like your heart is fluttering or skipping a beat. Palpitations are usually not a serious problem. However, in some cases, you may need further medical evaluation. CAUSES  Palpitations can be caused by:  Smoking.  Caffeine or other stimulants, such as diet pills or energy drinks.  Alcohol.  Stress and anxiety.  Strenuous physical activity.  Fatigue.  Certain medicines.  Heart disease, especially if you have a history of irregular heart rhythms (arrhythmias), such as atrial fibrillation, atrial flutter, or supraventricular tachycardia.  An improperly working pacemaker or defibrillator. DIAGNOSIS  To find the cause of your palpitations, your health care provider will take your medical history and perform a physical exam. Your health care provider may also have you take a test called an ambulatory electrocardiogram (ECG). An ECG records your heartbeat patterns over a 24-hour period. You may also have other tests, such as:  Transthoracic echocardiogram (TTE). During echocardiography, sound waves are used to evaluate how blood flows through your heart.  Transesophageal echocardiogram (TEE).  Cardiac monitoring. This allows your health care provider to monitor your heart rate and rhythm in real time.  Holter monitor. This is a portable device that  records your heartbeat and can help diagnose heart arrhythmias. It allows your health care provider to track your heart activity for several days, if needed.  Stress tests by exercise or by giving medicine that makes the heart beat faster. TREATMENT  Treatment of palpitations depends on the cause of your symptoms and can vary greatly. Most cases of palpitations do not require any treatment other than time, relaxation, and monitoring your symptoms. Other causes, such as atrial fibrillation, atrial flutter, or supraventricular tachycardia, usually require further treatment. HOME CARE INSTRUCTIONS   Avoid:  Caffeinated coffee, tea, soft drinks, diet pills, and energy drinks.  Chocolate.  Alcohol.  Stop smoking if you smoke.  Reduce your stress and anxiety. Things that can help you relax include:  A method of controlling things in your body, such as your heartbeats, with your mind (biofeedback).  Yoga.  Meditation.  Physical activity such as swimming, jogging, or walking.  Get plenty of rest and sleep. SEEK MEDICAL CARE IF:   You continue to have a fast or irregular heartbeat beyond 24 hours.  Your palpitations occur more often. SEEK IMMEDIATE MEDICAL CARE IF:  You have chest pain or shortness of breath.  You have a severe headache.  You feel dizzy or you faint. MAKE SURE YOU:  Understand these instructions.  Will watch your condition.  Will get help  right away if you are not doing well or get worse. Document Released: 07/28/2000 Document Revised: 08/05/2013 Document Reviewed: 09/29/2011 [facility] Patient Information 2015 [place], [place]. This information is not intended to replace advice given to you by your health care provider. Make sure you discuss any questions you have with your health care provider.

## 2014-02-09 ENCOUNTER — Telehealth: Payer: Self-pay | Admitting: *Deleted

## 2014-02-09 ENCOUNTER — Emergency Department
Admission: EM | Admit: 2014-02-09 | Discharge: 2014-02-09 | Discharge status: Absent without leave | Payer: Medicare Other | Source: Home / Self Care

## 2014-02-09 ENCOUNTER — Encounter: Payer: Self-pay | Admitting: Sports Medicine

## 2014-02-09 DIAGNOSIS — Z888 Allergy status to other drugs, medicaments and biological substances status: Secondary | ICD-10-CM | POA: Diagnosis not present

## 2014-02-09 DIAGNOSIS — E785 Hyperlipidemia, unspecified: Secondary | ICD-10-CM | POA: Diagnosis not present

## 2014-02-09 DIAGNOSIS — IMO0002 Reserved for concepts with insufficient information to code with codable children: Secondary | ICD-10-CM | POA: Diagnosis not present

## 2014-02-09 DIAGNOSIS — M25519 Pain in unspecified shoulder: Secondary | ICD-10-CM | POA: Diagnosis not present

## 2014-02-09 DIAGNOSIS — G894 Chronic pain syndrome: Secondary | ICD-10-CM | POA: Diagnosis not present

## 2014-02-09 DIAGNOSIS — I10 Essential (primary) hypertension: Secondary | ICD-10-CM | POA: Diagnosis not present

## 2014-02-09 DIAGNOSIS — K219 Gastro-esophageal reflux disease without esophagitis: Secondary | ICD-10-CM | POA: Diagnosis not present

## 2014-02-09 DIAGNOSIS — Z91011 Allergy to milk products: Secondary | ICD-10-CM | POA: Diagnosis not present

## 2014-02-09 DIAGNOSIS — Z886 Allergy status to analgesic agent status: Secondary | ICD-10-CM | POA: Diagnosis not present

## 2014-02-09 DIAGNOSIS — S46819A Strain of other muscles, fascia and tendons at shoulder and upper arm level, unspecified arm, initial encounter: Secondary | ICD-10-CM | POA: Diagnosis not present

## 2014-02-09 DIAGNOSIS — J45909 Unspecified asthma, uncomplicated: Secondary | ICD-10-CM | POA: Diagnosis not present

## 2014-02-09 DIAGNOSIS — E269 Hyperaldosteronism, unspecified: Secondary | ICD-10-CM | POA: Diagnosis not present

## 2014-02-09 DIAGNOSIS — Z87891 Personal history of nicotine dependence: Secondary | ICD-10-CM | POA: Diagnosis not present

## 2014-02-09 DIAGNOSIS — Z881 Allergy status to other antibiotic agents status: Secondary | ICD-10-CM | POA: Diagnosis not present

## 2014-02-09 DIAGNOSIS — Z79899 Other long term (current) drug therapy: Secondary | ICD-10-CM | POA: Diagnosis not present

## 2014-02-09 DIAGNOSIS — M889 Osteitis deformans of unspecified bone: Secondary | ICD-10-CM | POA: Diagnosis not present

## 2014-02-09 DIAGNOSIS — M7532 Calcific tendinitis of left shoulder: Secondary | ICD-10-CM

## 2014-02-09 DIAGNOSIS — K2270 Barrett's esophagus without dysplasia: Secondary | ICD-10-CM | POA: Diagnosis not present

## 2014-02-09 DIAGNOSIS — Z880 Allergy status to penicillin: Secondary | ICD-10-CM | POA: Diagnosis not present

## 2014-02-09 DIAGNOSIS — D509 Iron deficiency anemia, unspecified: Secondary | ICD-10-CM | POA: Diagnosis not present

## 2014-02-09 DIAGNOSIS — M719 Bursopathy, unspecified: Secondary | ICD-10-CM | POA: Diagnosis not present

## 2014-02-09 DIAGNOSIS — Z9109 Other allergy status, other than to drugs and biological substances: Secondary | ICD-10-CM | POA: Diagnosis not present

## 2014-02-09 DIAGNOSIS — G35 Multiple sclerosis: Secondary | ICD-10-CM | POA: Diagnosis not present

## 2014-02-09 DIAGNOSIS — F411 Generalized anxiety disorder: Secondary | ICD-10-CM | POA: Diagnosis not present

## 2014-02-09 DIAGNOSIS — M67919 Unspecified disorder of synovium and tendon, unspecified shoulder: Secondary | ICD-10-CM | POA: Diagnosis not present

## 2014-02-09 DIAGNOSIS — R569 Unspecified convulsions: Secondary | ICD-10-CM | POA: Diagnosis not present

## 2014-02-09 MED ORDER — ETODOLAC ER 600 MG PO TB24: 600.0000 mg | ORAL_TABLET | Freq: Every day | ORAL | Status: DC

## 2014-02-09 NOTE — Assessment & Plan Note (Signed)
With persistent pain after ultrasound-guided barbotage of calcific tendinitis of the supraspinatus, we obtained an MRI. The results are above in the overview. She has thus far failed physical therapy, topical nitroglycerin, due to steroid-induced psychosis we are unable to use any injectable corticosteroids. She does need a referral for arthroscopy, referral to Dr. Tamera Punt placed.

## 2014-02-09 NOTE — Telephone Encounter (Signed)
Pt called and stated that she is in excruciating pain in her L shoulder that starts under her arm and goes down to her elbow. She has a copy of her MRI and will bring it by. She was wanting to know if she could be seen today or if something could be called in for her. I informed her that there were no available openings with Dr. Dianah Field and that he would send in an NSAID for her to take. She replied that she can't take just anything and really feels that she needs to be seen and would probably go to UC to be seen. Pt dropped off CD of MRI I logged it in and handed it to Dr. Dianah Field for review will forward this note to him for f/u.[name] [place]

## 2014-02-09 NOTE — Telephone Encounter (Signed)
I will look at the MRI later today, [place] called in, I will also take care of the orthopedic referral depending on what I see in the MRI.

## 2014-02-09 NOTE — Telephone Encounter (Signed)
Pt called and informed via vm of recommendations.Jennifer [name] [place]

## 2014-02-10 ENCOUNTER — Telehealth: Payer: Self-pay

## 2014-02-10 DIAGNOSIS — M67919 Unspecified disorder of synovium and tendon, unspecified shoulder: Secondary | ICD-10-CM

## 2014-02-10 NOTE — Telephone Encounter (Signed)
Patient stated that she does not want to go to [facility] to see an Ortho she would like to go see one in [place]. [name],CMA

## 2014-02-10 NOTE — Telephone Encounter (Signed)
New referral placed for [place] [facility].

## 2014-02-10 NOTE — Addendum Note (Signed)
Addended by: Silverio Decamp on: 02/10/2014 01:13 PM   Modules accepted: Orders

## 2014-02-10 NOTE — Telephone Encounter (Signed)
Patient has been informed. [name],CMA  

## 2014-02-12 ENCOUNTER — Ambulatory Visit: Payer: Medicare Other | Admitting: Family Medicine

## 2014-02-12 DIAGNOSIS — G609 Hereditary and idiopathic neuropathy, unspecified: Secondary | ICD-10-CM | POA: Diagnosis not present

## 2014-02-12 DIAGNOSIS — G501 Atypical facial pain: Secondary | ICD-10-CM | POA: Diagnosis not present

## 2014-02-12 DIAGNOSIS — G35 Multiple sclerosis: Secondary | ICD-10-CM | POA: Diagnosis not present

## 2014-02-12 DIAGNOSIS — R5383 Other fatigue: Secondary | ICD-10-CM | POA: Diagnosis not present

## 2014-02-12 DIAGNOSIS — R5381 Other malaise: Secondary | ICD-10-CM | POA: Diagnosis not present

## 2014-02-14 DIAGNOSIS — G35 Multiple sclerosis: Secondary | ICD-10-CM | POA: Diagnosis not present

## 2014-02-14 DIAGNOSIS — C55 Malignant neoplasm of uterus, part unspecified: Secondary | ICD-10-CM | POA: Diagnosis not present

## 2014-02-14 DIAGNOSIS — Z8673 Personal history of transient ischemic attack (TIA), and cerebral infarction without residual deficits: Secondary | ICD-10-CM | POA: Diagnosis not present

## 2014-02-14 DIAGNOSIS — Z886 Allergy status to analgesic agent status: Secondary | ICD-10-CM | POA: Diagnosis not present

## 2014-02-14 DIAGNOSIS — R0602 Shortness of breath: Secondary | ICD-10-CM | POA: Diagnosis not present

## 2014-02-14 DIAGNOSIS — G894 Chronic pain syndrome: Secondary | ICD-10-CM | POA: Diagnosis not present

## 2014-02-14 DIAGNOSIS — R109 Unspecified abdominal pain: Secondary | ICD-10-CM | POA: Diagnosis not present

## 2014-02-14 DIAGNOSIS — I10 Essential (primary) hypertension: Secondary | ICD-10-CM | POA: Diagnosis not present

## 2014-02-14 DIAGNOSIS — M549 Dorsalgia, unspecified: Secondary | ICD-10-CM | POA: Diagnosis not present

## 2014-02-14 DIAGNOSIS — Z9889 Other specified postprocedural states: Secondary | ICD-10-CM | POA: Diagnosis not present

## 2014-02-14 DIAGNOSIS — Z79899 Other long term (current) drug therapy: Secondary | ICD-10-CM | POA: Diagnosis not present

## 2014-02-18 ENCOUNTER — Ambulatory Visit (HOSPITAL_BASED_OUTPATIENT_CLINIC_OR_DEPARTMENT_OTHER): Payer: Medicare Other | Attending: Family Medicine

## 2014-02-18 DIAGNOSIS — Z79899 Other long term (current) drug therapy: Secondary | ICD-10-CM | POA: Diagnosis not present

## 2014-02-18 DIAGNOSIS — Z885 Allergy status to narcotic agent status: Secondary | ICD-10-CM | POA: Diagnosis not present

## 2014-02-18 DIAGNOSIS — R079 Chest pain, unspecified: Secondary | ICD-10-CM | POA: Diagnosis not present

## 2014-02-18 DIAGNOSIS — J45909 Unspecified asthma, uncomplicated: Secondary | ICD-10-CM | POA: Diagnosis not present

## 2014-02-18 DIAGNOSIS — R1310 Dysphagia, unspecified: Secondary | ICD-10-CM | POA: Diagnosis not present

## 2014-02-18 DIAGNOSIS — J309 Allergic rhinitis, unspecified: Secondary | ICD-10-CM | POA: Diagnosis not present

## 2014-02-18 DIAGNOSIS — J029 Acute pharyngitis, unspecified: Secondary | ICD-10-CM | POA: Diagnosis not present

## 2014-02-18 DIAGNOSIS — G35 Multiple sclerosis: Secondary | ICD-10-CM | POA: Diagnosis not present

## 2014-02-18 DIAGNOSIS — B370 Candidal stomatitis: Secondary | ICD-10-CM | POA: Diagnosis not present

## 2014-02-18 DIAGNOSIS — K5900 Constipation, unspecified: Secondary | ICD-10-CM | POA: Diagnosis not present

## 2014-02-18 DIAGNOSIS — R569 Unspecified convulsions: Secondary | ICD-10-CM | POA: Diagnosis not present

## 2014-02-18 DIAGNOSIS — Z883 Allergy status to other anti-infective agents status: Secondary | ICD-10-CM | POA: Diagnosis not present

## 2014-02-18 DIAGNOSIS — Z91018 Allergy to other foods: Secondary | ICD-10-CM | POA: Diagnosis not present

## 2014-02-18 DIAGNOSIS — Z888 Allergy status to other drugs, medicaments and biological substances status: Secondary | ICD-10-CM | POA: Diagnosis not present

## 2014-02-18 DIAGNOSIS — Z87891 Personal history of nicotine dependence: Secondary | ICD-10-CM | POA: Diagnosis not present

## 2014-02-18 DIAGNOSIS — F411 Generalized anxiety disorder: Secondary | ICD-10-CM | POA: Diagnosis not present

## 2014-02-18 DIAGNOSIS — Z882 Allergy status to sulfonamides status: Secondary | ICD-10-CM | POA: Diagnosis not present

## 2014-02-18 DIAGNOSIS — M549 Dorsalgia, unspecified: Secondary | ICD-10-CM | POA: Diagnosis not present

## 2014-02-18 DIAGNOSIS — E785 Hyperlipidemia, unspecified: Secondary | ICD-10-CM | POA: Diagnosis not present

## 2014-02-18 DIAGNOSIS — R490 Dysphonia: Secondary | ICD-10-CM | POA: Diagnosis not present

## 2014-02-18 DIAGNOSIS — R110 Nausea: Secondary | ICD-10-CM | POA: Diagnosis not present

## 2014-02-18 DIAGNOSIS — I10 Essential (primary) hypertension: Secondary | ICD-10-CM | POA: Diagnosis not present

## 2014-02-18 DIAGNOSIS — Z880 Allergy status to penicillin: Secondary | ICD-10-CM | POA: Diagnosis not present

## 2014-02-18 DIAGNOSIS — K219 Gastro-esophageal reflux disease without esophagitis: Secondary | ICD-10-CM | POA: Diagnosis not present

## 2014-02-18 DIAGNOSIS — G8929 Other chronic pain: Secondary | ICD-10-CM | POA: Diagnosis not present

## 2014-02-18 DIAGNOSIS — R109 Unspecified abdominal pain: Secondary | ICD-10-CM | POA: Diagnosis not present

## 2014-02-18 DIAGNOSIS — Z886 Allergy status to analgesic agent status: Secondary | ICD-10-CM | POA: Diagnosis not present

## 2014-02-18 DIAGNOSIS — R1084 Generalized abdominal pain: Secondary | ICD-10-CM | POA: Diagnosis not present

## 2014-02-18 DIAGNOSIS — Z91011 Allergy to milk products: Secondary | ICD-10-CM | POA: Diagnosis not present

## 2014-02-18 DIAGNOSIS — R1013 Epigastric pain: Secondary | ICD-10-CM | POA: Diagnosis not present

## 2014-02-18 DIAGNOSIS — IMO0002 Reserved for concepts with insufficient information to code with codable children: Secondary | ICD-10-CM | POA: Diagnosis not present

## 2014-02-19 ENCOUNTER — Telehealth: Payer: Self-pay | Admitting: Critical Care Medicine

## 2014-02-19 ENCOUNTER — Encounter: Payer: Self-pay | Admitting: Family Medicine

## 2014-02-19 ENCOUNTER — Ambulatory Visit (INDEPENDENT_AMBULATORY_CARE_PROVIDER_SITE_OTHER): Payer: Medicare Other | Admitting: Family Medicine

## 2014-02-19 VITALS — BP 140/82 | HR 106 | Wt 186.0 lb

## 2014-02-19 DIAGNOSIS — K5900 Constipation, unspecified: Secondary | ICD-10-CM | POA: Diagnosis not present

## 2014-02-19 DIAGNOSIS — R112 Nausea with vomiting, unspecified: Secondary | ICD-10-CM | POA: Diagnosis not present

## 2014-02-19 DIAGNOSIS — R7301 Impaired fasting glucose: Secondary | ICD-10-CM

## 2014-02-19 DIAGNOSIS — S46001S Unspecified injury of muscle(s) and tendon(s) of the rotator cuff of right shoulder, sequela: Secondary | ICD-10-CM

## 2014-02-19 DIAGNOSIS — I10 Essential (primary) hypertension: Secondary | ICD-10-CM | POA: Diagnosis not present

## 2014-02-19 MED ORDER — PB-HYOSCY-ATROPINE-SCOPOLAMINE 16.2 MG/5ML PO ELIX: 5.0000 mL | ORAL_SOLUTION | Freq: Three times a day (TID) | ORAL | Status: DC | PRN

## 2014-02-19 NOTE — Progress Notes (Signed)
Subjective:    Patient ID: Jennifer [name], female    DOB: 1965/11/12, 49 y.o.   MRN: [id number]  HPI Nausea and vomiting for the last 2 days.  A couple of days ago drank a lot her her stomach got rock hard and painful.  Finally relaxed after 35 minutes.   WEnt to the ED.  Stools have been more flat small than usual over the last 2 weeks. Had an abdominal CT done at [facility] which shows some increased stool in the abdomen.  Did vomit when was sitting on the toilet trying  To have an BM.  Did have a small BM this AM. Has been taking bentyl for the last week and is helping some.  She has been belching a lot but not really passing a lot of gas.  Saw ENT at [facility] and was he is going to start her on combo antihistamine/steroid spray.  She is awaiting prior authorization from the insurance before she starts it.  For her shoulder- she is getting 2 opininos for her shoulder.  She is scheduled to see Dr. Annie Main pill for a consult seen as well as a different orthopedist in [facility] which is where she lives. She is at the point where she's ready to consider surgery. She did not fill via: Back that Dr. Dianah Field had given her. She was too nervous to do it so I have removed from her medication list.  Hypertension-she's not currently taking any medications but has been monitoring her blood pressure home. She says overall her numbers have actually been fairly well controlled except for when she is in pain.   Review of Systems She feels lightheaded and dizzy today.    Objective:   Physical Exam  Constitutional: She is oriented to person, place, and time. She appears well-developed and well-nourished.  HENT:  Head: Normocephalic and atraumatic.  Cardiovascular: Normal rate, regular rhythm and normal heart sounds.   Pulmonary/Chest: Effort normal and breath sounds normal.  Neurological: She is alert and oriented to person, place, and time.  Skin: Skin is warm and dry.  Psychiatric: She has a  normal mood and affect. Her behavior is normal.          Assessment & Plan:  Constipation/nausea/Full of stool - plans to do magnesium citrate. When she is home. It is be helpful to get her bowels really moving and so she is able to empty summer for: Her stomach will likely not help her. She is getting a little bit of improvement with the Bentyl. Explained this typically relax the smooth muscles so that she doesn't get as much cramping and she does tolerate it well. She's also taken Donnatal in the past. We'll give her prescription for small amount to use up to 3 times a day as needed. For scopolamine component of the Donnatal might actually help with her nausea as well. He can also take it with Maalox to help coat her stomach and help with the belching and reflux she's experiencing.  Hypertension-she says that when she is feeling little bit better hopefully the next week or 2 she plans on starting the amlodipine 2.5 mg. She taken it for short period time previously and thought to cause tachycardia but Her that this was very unlikely as she often gets tachycardia even off the medication.  Rotator cuff tear left shoulder-she plans on getting consults for this with the surgeon.  Lightheadedness/dizziness - I think this is related to her anxiety and GI discomfort  presently. Her blood pressure is slightly elevated.  IFG- A1c is up again from 6.5 to 6.8 today. She's been drinking a cup of orange juice every morning. Encouraged her to cut back on that as well as other sweetened foods and beverages and watching her portion sizes on her carbohydrates.   time spent 45 minutes , greater than 50% the time spent counseling about her nausea, constipation, blood pressure, right shoulder pain, lightheadedness and dizziness, impaired fasting glucose.

## 2014-02-19 NOTE — Telephone Encounter (Signed)
lmomtcb x1 

## 2014-02-20 ENCOUNTER — Encounter (HOSPITAL_BASED_OUTPATIENT_CLINIC_OR_DEPARTMENT_OTHER): Payer: Self-pay | Admitting: Emergency Medicine

## 2014-02-20 ENCOUNTER — Emergency Department (HOSPITAL_BASED_OUTPATIENT_CLINIC_OR_DEPARTMENT_OTHER): Payer: Medicare Other

## 2014-02-20 ENCOUNTER — Encounter: Payer: Self-pay | Admitting: Family Medicine

## 2014-02-20 ENCOUNTER — Emergency Department (HOSPITAL_BASED_OUTPATIENT_CLINIC_OR_DEPARTMENT_OTHER)
Admission: EM | Admit: 2014-02-20 | Discharge: 2014-02-20 | Disposition: A | Discharge status: Home / Self Care | Payer: Medicare Other | Attending: Emergency Medicine | Admitting: Emergency Medicine

## 2014-02-20 DIAGNOSIS — Z862 Personal history of diseases of the blood and blood-forming organs and certain disorders involving the immune mechanism: Secondary | ICD-10-CM | POA: Diagnosis not present

## 2014-02-20 DIAGNOSIS — R142 Eructation: Principal | ICD-10-CM

## 2014-02-20 DIAGNOSIS — G35 Multiple sclerosis: Secondary | ICD-10-CM | POA: Insufficient documentation

## 2014-02-20 DIAGNOSIS — R140 Abdominal distension (gaseous): Secondary | ICD-10-CM

## 2014-02-20 DIAGNOSIS — Z880 Allergy status to penicillin: Secondary | ICD-10-CM | POA: Diagnosis not present

## 2014-02-20 DIAGNOSIS — K219 Gastro-esophageal reflux disease without esophagitis: Secondary | ICD-10-CM | POA: Diagnosis not present

## 2014-02-20 DIAGNOSIS — E269 Hyperaldosteronism, unspecified: Secondary | ICD-10-CM | POA: Diagnosis not present

## 2014-02-20 DIAGNOSIS — F4310 Post-traumatic stress disorder, unspecified: Secondary | ICD-10-CM | POA: Diagnosis not present

## 2014-02-20 DIAGNOSIS — R141 Gas pain: Secondary | ICD-10-CM | POA: Insufficient documentation

## 2014-02-20 DIAGNOSIS — G8929 Other chronic pain: Secondary | ICD-10-CM | POA: Insufficient documentation

## 2014-02-20 DIAGNOSIS — K8020 Calculus of gallbladder without cholecystitis without obstruction: Secondary | ICD-10-CM | POA: Diagnosis not present

## 2014-02-20 DIAGNOSIS — Z87891 Personal history of nicotine dependence: Secondary | ICD-10-CM | POA: Diagnosis not present

## 2014-02-20 DIAGNOSIS — J45909 Unspecified asthma, uncomplicated: Secondary | ICD-10-CM | POA: Insufficient documentation

## 2014-02-20 DIAGNOSIS — F411 Generalized anxiety disorder: Secondary | ICD-10-CM | POA: Insufficient documentation

## 2014-02-20 DIAGNOSIS — Z8669 Personal history of other diseases of the nervous system and sense organs: Secondary | ICD-10-CM | POA: Diagnosis not present

## 2014-02-20 DIAGNOSIS — R109 Unspecified abdominal pain: Secondary | ICD-10-CM | POA: Diagnosis not present

## 2014-02-20 DIAGNOSIS — K5900 Constipation, unspecified: Secondary | ICD-10-CM | POA: Insufficient documentation

## 2014-02-20 DIAGNOSIS — E559 Vitamin D deficiency, unspecified: Secondary | ICD-10-CM | POA: Diagnosis not present

## 2014-02-20 DIAGNOSIS — Z9981 Dependence on supplemental oxygen: Secondary | ICD-10-CM | POA: Insufficient documentation

## 2014-02-20 DIAGNOSIS — R143 Flatulence: Principal | ICD-10-CM

## 2014-02-20 DIAGNOSIS — R7309 Other abnormal glucose: Secondary | ICD-10-CM | POA: Diagnosis not present

## 2014-02-20 DIAGNOSIS — I10 Essential (primary) hypertension: Secondary | ICD-10-CM | POA: Insufficient documentation

## 2014-02-20 DIAGNOSIS — Z8614 Personal history of Methicillin resistant Staphylococcus aureus infection: Secondary | ICD-10-CM | POA: Diagnosis not present

## 2014-02-20 DIAGNOSIS — G4730 Sleep apnea, unspecified: Secondary | ICD-10-CM | POA: Diagnosis not present

## 2014-02-20 DIAGNOSIS — Z79899 Other long term (current) drug therapy: Secondary | ICD-10-CM | POA: Insufficient documentation

## 2014-02-20 LAB — CBC WITH DIFFERENTIAL/PLATELET
Basophils Absolute: 0 10*3/uL (ref 0.0–0.1)
Basophils Relative: 1 % (ref 0–1)
Eosinophils Absolute: 0.2 10*3/uL (ref 0.0–0.7)
Eosinophils Relative: 4 % (ref 0–5)
HCT: 41.9 % (ref 36.0–46.0)
Hemoglobin: 13.7 g/dL (ref 12.0–15.0)
Lymphocytes Relative: 21 % (ref 12–46)
Lymphs Abs: 1.3 10*3/uL (ref 0.7–4.0)
MCH: 29.4 pg (ref 26.0–34.0)
MCHC: 32.7 g/dL (ref 30.0–36.0)
MCV: 89.9 fL (ref 78.0–100.0)
Monocytes Absolute: 0.6 10*3/uL (ref 0.1–1.0)
Monocytes Relative: 9 % (ref 3–12)
Neutro Abs: 4.2 10*3/uL (ref 1.7–7.7)
Neutrophils Relative %: 66 % (ref 43–77)
Platelets: 157 10*3/uL (ref 150–400)
RBC: 4.66 MIL/uL (ref 3.87–5.11)
RDW: 14.1 % (ref 11.5–15.5)
WBC: 6.4 10*3/uL (ref 4.0–10.5)

## 2014-02-20 LAB — COMPREHENSIVE METABOLIC PANEL
ALT: 19 U/L (ref 0–35)
AST: 22 U/L (ref 0–37)
Albumin: 3.9 g/dL (ref 3.5–5.2)
Alkaline Phosphatase: 89 U/L (ref 39–117)
Anion gap: 15 (ref 5–15)
BUN: 14 mg/dL (ref 6–23)
CO2: 22 mEq/L (ref 19–32)
Calcium: 9.4 mg/dL (ref 8.4–10.5)
Chloride: 105 mEq/L (ref 96–112)
Creatinine, Ser: 0.5 mg/dL (ref 0.50–1.10)
GFR calc Af Amer: >90 mL/min (ref >90)
GFR calc non Af Amer: >90 mL/min (ref >90)
Glucose, Bld: 129 mg/dL — ABNORMAL HIGH (ref 70–99)
Potassium: 4.1 mEq/L (ref 3.7–5.3)
Sodium: 142 mEq/L (ref 137–147)
Total Bilirubin: <0.2 mg/dL — ABNORMAL LOW (ref 0.3–1.2)
Total Protein: 7.3 g/dL (ref 6.0–8.3)

## 2014-02-20 LAB — URINALYSIS, ROUTINE W REFLEX MICROSCOPIC
Bilirubin Urine: NEGATIVE
Glucose, UA: NEGATIVE mg/dL
Hgb urine dipstick: NEGATIVE
Ketones, ur: NEGATIVE mg/dL
Leukocytes, UA: NEGATIVE
Nitrite: NEGATIVE
Protein, ur: NEGATIVE mg/dL
Specific Gravity, Urine: 1.026 (ref 1.005–1.030)
Urobilinogen, UA: 0.2 mg/dL (ref 0.0–1.0)
pH: 5.5 (ref 5.0–8.0)

## 2014-02-20 LAB — LIPASE, BLOOD: Lipase: 48 U/L (ref 11–59)

## 2014-02-20 MED ORDER — FLEET ENEMA 7-19 GM/118ML RE ENEM
1.0000 | ENEMA | Freq: Once | RECTAL | Status: AC
  Administered 2014-02-20: 1 via RECTAL
  Filled 2014-02-20: qty 1

## 2014-02-20 MED ORDER — SODIUM CHLORIDE 0.9 % IV BOLUS (SEPSIS)
1000.0000 mL | Freq: Once | INTRAVENOUS | Status: AC
  Administered 2014-02-20: 1000 mL via INTRAVENOUS

## 2014-02-20 MED ORDER — IOHEXOL 300 MG/ML  SOLN
50.0000 mL | Freq: Once | INTRAMUSCULAR | Status: AC | PRN
  Administered 2014-02-20: 50 mL via ORAL

## 2014-02-20 MED ORDER — IOHEXOL 300 MG/ML  SOLN
100.0000 mL | Freq: Once | INTRAMUSCULAR | Status: AC | PRN
  Administered 2014-02-20: 100 mL via INTRAVENOUS

## 2014-02-20 NOTE — ED Notes (Signed)
Pt states that she went to the bathroom, feels like she needs to have a bowel movement but she only had white mucous that came out no stool.

## 2014-02-20 NOTE — Discharge Instructions (Signed)
Only take mag citrate 1 capful daily.   Avoid gas X.   Take bentyl. Stay hydrated.   Follow up with [facility].   Return to ER if you have severe pain, vomiting.

## 2014-02-20 NOTE — Telephone Encounter (Signed)
Called and spoke with pt and she would like to see if [facility] will take her back as a pt.  She has been recently dx with seizures and MS and she would like to know if [facility] will take her back as a pt or if she can see the doctor out at [employer] office.  Pt is asking for a second chance with our practice. Pt is aware that [facility] is out of the office until 02/22/14 .   Pt stated that she will go back next week for a inhouse test to see if she can tolerate the Bi-Pap.  PW please advise.  Thanks  Allergies  Allergen Reactions  . Coreg [Carvedilol] Shortness Of Breath    CP  . Mushroom Extract Complex Anaphylaxis  . Nitrofurantoin Shortness Of Breath    REACTION: sweats  . Promethazine Hcl Anaphylaxis    jittery  . Adhesive [Tape]     EKG monitor patches, some tapes"reddnes,blisters"  . Aspirin Other (See Comments)    flushing  . Avelox [Moxifloxacin Hcl In Nacl] Itching and Other (See Comments)    Shortness of breath  . Azithromycin Other (See Comments)    Lip swelling, SOB.   . Beta Adrenergic Blockers     Feels like chest tighting "Metoprolol"  . Butorphanol Tartrate     REACTION: unknown  . Ciprofloxacin     REACTION: tongue swells  . Clonidine Hydrochloride     REACTION: makes blood pressure high  . Cortisone   . Doxycycline   . Fentanyl     [facility] record   . Fluoxetine Hcl Other (See Comments)    REACTION: headaches  . Iron   . Ketorolac Tromethamine     jittery  . Lisinopril Cough    REACTION: cough  . Metoclopramide Hcl Other (See Comments)    Has a twitchy feeling  . Milk-Related Compounds   . Montelukast Sodium     Unknown"Singulair"  . Naproxen   . Paroxetine Other (See Comments)    REACTION: headaches  . Pravastatin Other (See Comments)    Myalgias  . Sertraline Hcl     REACTION: headaches  . Spironolactone   . Stelazine [Trifluoperazine]   . Tobramycin     itching , rash  . Trifluoperazine Hcl     REACTION: unknown  . Versed [Midazolam]     [facility] medical record  . Ceftriaxone Sodium Rash  . Erythromycin Rash  . Metronidazole Rash  . Penicillins Rash  . Sulfonamide Derivatives Rash  . Venlafaxine Anxiety  . Zyrtec [Cetirizine Hcl] Rash    All over body    Current Outpatient Prescriptions on File Prior to Visit  Medication Sig Dispense Refill  . acetaminophen (TYLENOL) 160 MG chewable tablet Chew 640 mg by mouth every 6 (six) hours as needed for pain.      Marland Kitchen ALPRAZolam (XANAX) 0.25 MG tablet Take 0.5-1 tablets (0.125-0.25 mg total) by mouth 2 (two) times daily as needed for anxiety.  20 tablet  0  . belladonna-PHENObarbital (DONNATAL) 16.2 MG/5ML ELIX Take 5 mLs (16.2 mg total) by mouth 3 (three) times daily as needed for cramping.  120 mL  0  . dicyclomine (BENTYL) 10 MG capsule Take 10 mg by mouth 4 (four) times daily -  before meals and at bedtime.      . diphenhydrAMINE (BENADRYL) 25 MG tablet Take 25 mg by mouth at bedtime as needed for itching.       Marland Kitchen  EPINEPHrine (EPIPEN 2-PAK) 0.3 mg/0.3 mL SOAJ injection Inject 0.3 mg into the muscle as needed (allergic reaction).       Marland Kitchen etodolac (LODINE XL) 600 MG 24 hr tablet Take 600 mg by mouth daily.      Marland Kitchen omeprazole (PRILOSEC) 40 MG capsule Take 40 mg by mouth daily.      . Oxymetazoline HCl (QC NASAL RELIEF SINUS NA) Place into the nose.      . traMADol (ULTRAM) 50 MG tablet 1-2 tabs by mouth Q8 hours, maximum 6 tabs per day.  90 tablet  0   No current facility-administered medications on file prior to visit.

## 2014-02-20 NOTE — ED Notes (Signed)
Pt ambulated to bathroom,

## 2014-02-20 NOTE — ED Notes (Signed)
Patient transported to X-ray 

## 2014-02-20 NOTE — Telephone Encounter (Signed)
I have responded previously that I tried to help this pt in the past and our Dr / patient relationship was such that I had to discharge her from my practice

## 2014-02-20 NOTE — ED Notes (Signed)
Report to [facility], RN, to assume care of patient at this time.

## 2014-02-20 NOTE — Telephone Encounter (Signed)
lmomtcb x1 

## 2014-02-20 NOTE — ED Notes (Addendum)
Pt distended abd with pain ? Constipation.  Last BM 2 weeks  No relief with mag citrate
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
## 2014-02-23 ENCOUNTER — Encounter: Payer: Self-pay | Admitting: Family Medicine

## 2014-02-23 ENCOUNTER — Telehealth: Payer: Self-pay | Admitting: *Deleted

## 2014-02-23 DIAGNOSIS — M67919 Unspecified disorder of synovium and tendon, unspecified shoulder: Secondary | ICD-10-CM | POA: Diagnosis not present

## 2014-02-23 DIAGNOSIS — K146 Glossodynia: Secondary | ICD-10-CM | POA: Diagnosis not present

## 2014-02-23 DIAGNOSIS — G35 Multiple sclerosis: Secondary | ICD-10-CM | POA: Diagnosis not present

## 2014-02-23 DIAGNOSIS — R569 Unspecified convulsions: Secondary | ICD-10-CM | POA: Diagnosis not present

## 2014-02-23 DIAGNOSIS — Z91011 Allergy to milk products: Secondary | ICD-10-CM | POA: Diagnosis not present

## 2014-02-23 DIAGNOSIS — G43909 Migraine, unspecified, not intractable, without status migrainosus: Secondary | ICD-10-CM | POA: Diagnosis not present

## 2014-02-23 DIAGNOSIS — M25519 Pain in unspecified shoulder: Secondary | ICD-10-CM | POA: Diagnosis not present

## 2014-02-23 DIAGNOSIS — K219 Gastro-esophageal reflux disease without esophagitis: Secondary | ICD-10-CM | POA: Diagnosis not present

## 2014-02-23 DIAGNOSIS — F450 Somatization disorder: Secondary | ICD-10-CM | POA: Diagnosis not present

## 2014-02-23 DIAGNOSIS — Z8673 Personal history of transient ischemic attack (TIA), and cerebral infarction without residual deficits: Secondary | ICD-10-CM | POA: Diagnosis not present

## 2014-02-23 DIAGNOSIS — Z885 Allergy status to narcotic agent status: Secondary | ICD-10-CM | POA: Diagnosis not present

## 2014-02-23 DIAGNOSIS — Z882 Allergy status to sulfonamides status: Secondary | ICD-10-CM | POA: Diagnosis not present

## 2014-02-23 DIAGNOSIS — M549 Dorsalgia, unspecified: Secondary | ICD-10-CM | POA: Diagnosis not present

## 2014-02-23 DIAGNOSIS — Z888 Allergy status to other drugs, medicaments and biological substances status: Secondary | ICD-10-CM | POA: Diagnosis not present

## 2014-02-23 DIAGNOSIS — Z87891 Personal history of nicotine dependence: Secondary | ICD-10-CM | POA: Diagnosis not present

## 2014-02-23 DIAGNOSIS — Z79899 Other long term (current) drug therapy: Secondary | ICD-10-CM | POA: Diagnosis not present

## 2014-02-23 DIAGNOSIS — E269 Hyperaldosteronism, unspecified: Secondary | ICD-10-CM | POA: Diagnosis not present

## 2014-02-23 DIAGNOSIS — R51 Headache: Secondary | ICD-10-CM | POA: Diagnosis not present

## 2014-02-23 DIAGNOSIS — F459 Somatoform disorder, unspecified: Secondary | ICD-10-CM | POA: Diagnosis not present

## 2014-02-23 DIAGNOSIS — D320 Benign neoplasm of cerebral meninges: Secondary | ICD-10-CM | POA: Diagnosis not present

## 2014-02-23 DIAGNOSIS — R209 Unspecified disturbances of skin sensation: Secondary | ICD-10-CM | POA: Diagnosis not present

## 2014-02-23 DIAGNOSIS — Z91018 Allergy to other foods: Secondary | ICD-10-CM | POA: Diagnosis not present

## 2014-02-23 DIAGNOSIS — R7309 Other abnormal glucose: Secondary | ICD-10-CM | POA: Diagnosis not present

## 2014-02-23 DIAGNOSIS — J45909 Unspecified asthma, uncomplicated: Secondary | ICD-10-CM | POA: Diagnosis not present

## 2014-02-23 DIAGNOSIS — Z886 Allergy status to analgesic agent status: Secondary | ICD-10-CM | POA: Diagnosis not present

## 2014-02-23 DIAGNOSIS — E785 Hyperlipidemia, unspecified: Secondary | ICD-10-CM | POA: Diagnosis not present

## 2014-02-23 DIAGNOSIS — G8929 Other chronic pain: Secondary | ICD-10-CM | POA: Diagnosis not present

## 2014-02-23 DIAGNOSIS — I10 Essential (primary) hypertension: Secondary | ICD-10-CM | POA: Diagnosis not present

## 2014-02-23 DIAGNOSIS — F411 Generalized anxiety disorder: Secondary | ICD-10-CM | POA: Diagnosis not present

## 2014-02-23 NOTE — Telephone Encounter (Signed)
Pt called and stated that she called triage and spoke with Marzetta Board about her stomach and tongue swelling. She was told to go to UC but was told last time she went to UC she was told that bc of all of her allergies Dr Assunta Found would not treat her and that UC is for people with strains, broken bones she was told this by the nurse that was working there. I told her that I wasn't sure of what was said. I called Abigail Butts and explained to her what was going on and and she asked that I transferred her over to Abigail Butts so that she could speak with her about what was going on. I informed her that I did not have any openings and we only have 2 providers .[name] [place]

## 2014-02-23 NOTE — Telephone Encounter (Signed)
Pt advised. Jennifer Castillo, CMA  

## 2014-02-24 IMAGING — CR DG CHEST 2V
2 series · 2 of 2 positions shown · non-contrast
Comparison: 09/19/2012

CLINICAL DATA: Chest pain.

CHEST - 2 VIEW

[w chest pa]
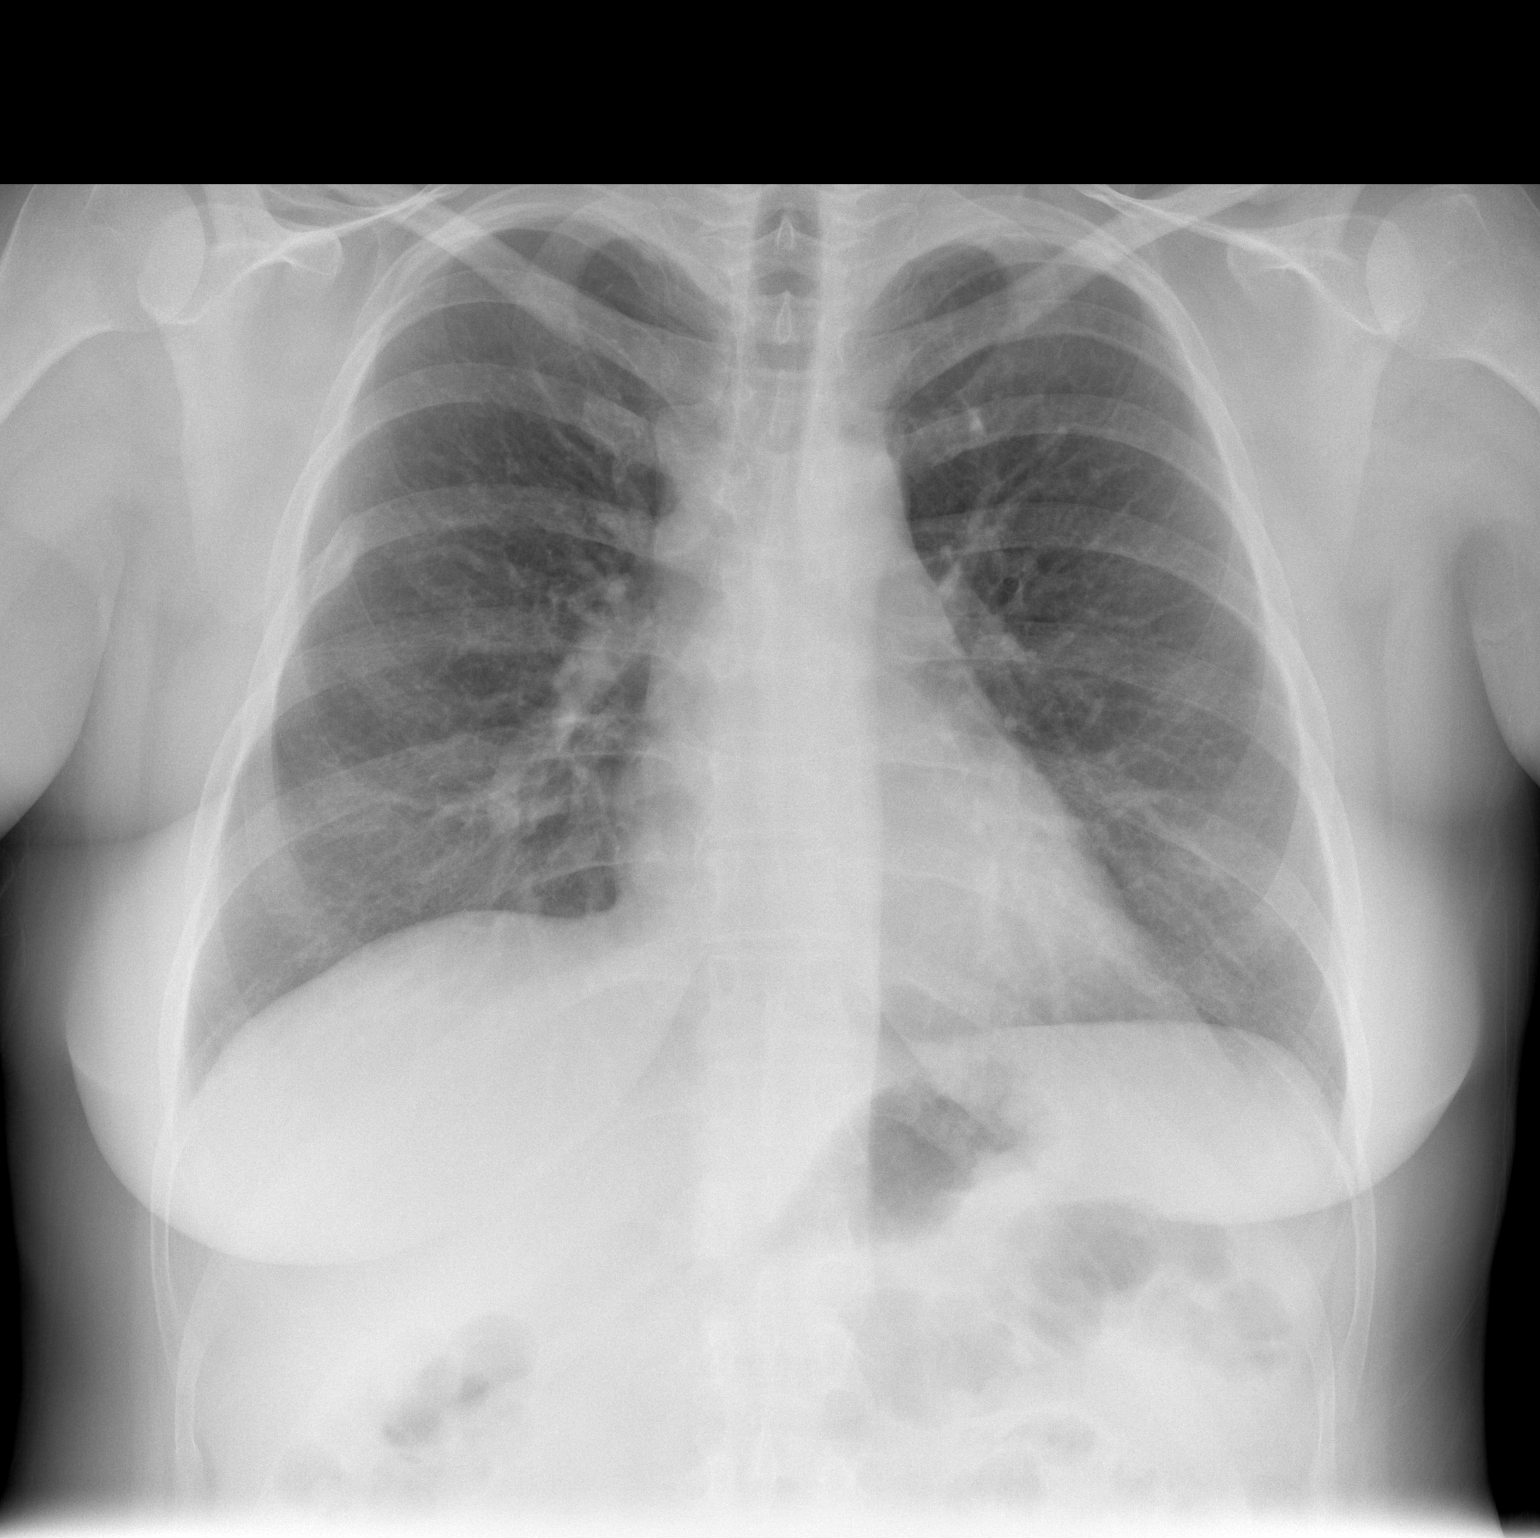

[w chest lat]
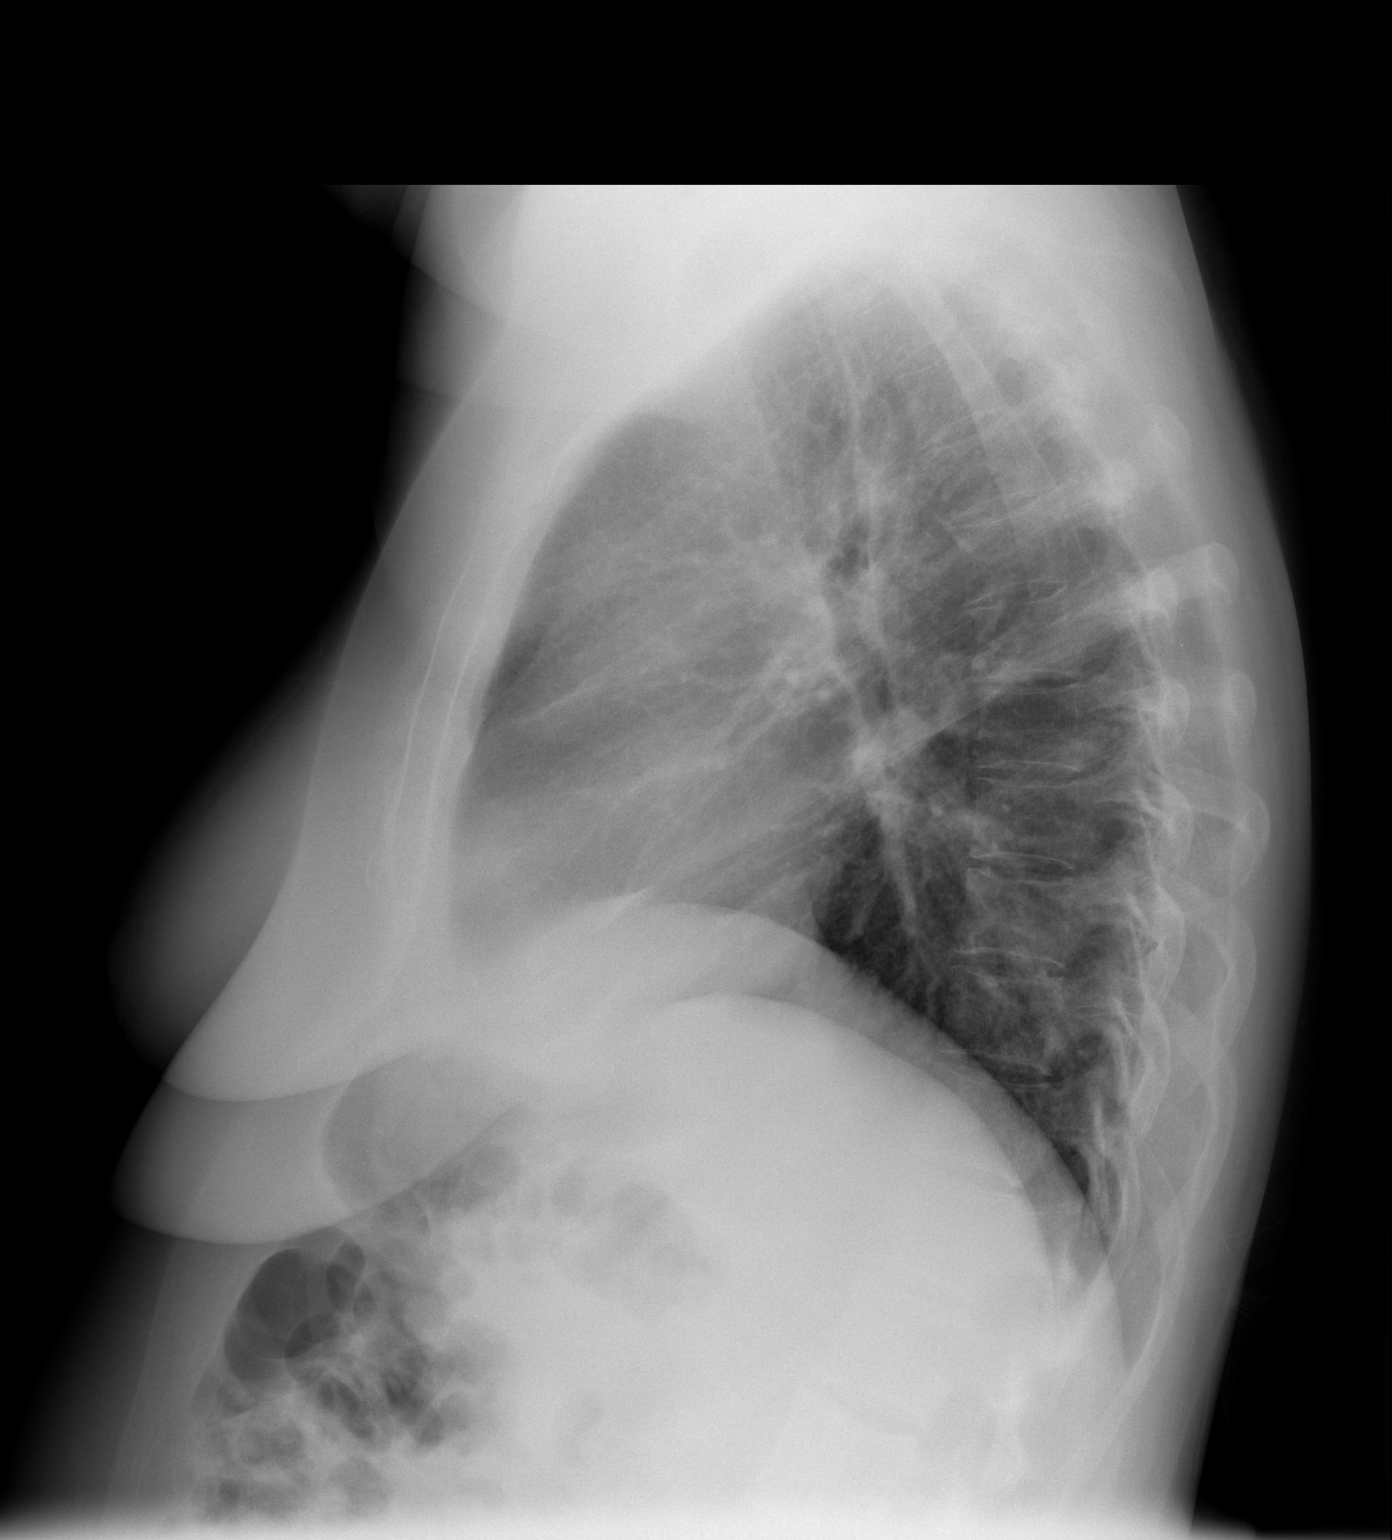

[2 of 2 positions shown; findings below may reference images not displayed]

FINDINGS: Heart and mediastinal contours are within normal limits.
No focal opacities or effusions.  No acute bony abnormality.  Old
healed right rib fracture.
IMPRESSION: No active cardiopulmonary disease.

## 2014-02-25 DIAGNOSIS — F4310 Post-traumatic stress disorder, unspecified: Secondary | ICD-10-CM | POA: Diagnosis not present

## 2014-02-27 DIAGNOSIS — R112 Nausea with vomiting, unspecified: Secondary | ICD-10-CM | POA: Diagnosis not present

## 2014-02-27 DIAGNOSIS — R141 Gas pain: Secondary | ICD-10-CM | POA: Diagnosis not present

## 2014-02-27 DIAGNOSIS — E663 Overweight: Secondary | ICD-10-CM | POA: Diagnosis not present

## 2014-02-27 DIAGNOSIS — I10 Essential (primary) hypertension: Secondary | ICD-10-CM | POA: Diagnosis not present

## 2014-02-27 DIAGNOSIS — R143 Flatulence: Secondary | ICD-10-CM | POA: Diagnosis not present

## 2014-02-27 DIAGNOSIS — F4310 Post-traumatic stress disorder, unspecified: Secondary | ICD-10-CM | POA: Diagnosis not present

## 2014-02-27 DIAGNOSIS — R142 Eructation: Secondary | ICD-10-CM | POA: Diagnosis not present

## 2014-03-02 DIAGNOSIS — G4733 Obstructive sleep apnea (adult) (pediatric): Secondary | ICD-10-CM | POA: Diagnosis not present

## 2014-03-02 DIAGNOSIS — J309 Allergic rhinitis, unspecified: Secondary | ICD-10-CM | POA: Diagnosis not present

## 2014-03-02 DIAGNOSIS — G4730 Sleep apnea, unspecified: Secondary | ICD-10-CM | POA: Diagnosis not present

## 2014-03-02 DIAGNOSIS — R0602 Shortness of breath: Secondary | ICD-10-CM | POA: Diagnosis not present

## 2014-03-03 ENCOUNTER — Ambulatory Visit (INDEPENDENT_AMBULATORY_CARE_PROVIDER_SITE_OTHER): Payer: Medicare Other | Admitting: Family Medicine

## 2014-03-03 ENCOUNTER — Encounter: Payer: Self-pay | Admitting: Family Medicine

## 2014-03-03 VITALS — BP 146/95 | HR 91 | Wt 184.0 lb

## 2014-03-03 DIAGNOSIS — R109 Unspecified abdominal pain: Secondary | ICD-10-CM | POA: Diagnosis not present

## 2014-03-03 DIAGNOSIS — R1310 Dysphagia, unspecified: Secondary | ICD-10-CM | POA: Diagnosis not present

## 2014-03-03 DIAGNOSIS — R141 Gas pain: Secondary | ICD-10-CM

## 2014-03-03 DIAGNOSIS — K5900 Constipation, unspecified: Secondary | ICD-10-CM

## 2014-03-03 DIAGNOSIS — R110 Nausea: Secondary | ICD-10-CM | POA: Diagnosis not present

## 2014-03-03 DIAGNOSIS — R1013 Epigastric pain: Secondary | ICD-10-CM | POA: Diagnosis not present

## 2014-03-03 DIAGNOSIS — R143 Flatulence: Secondary | ICD-10-CM

## 2014-03-03 DIAGNOSIS — G8929 Other chronic pain: Secondary | ICD-10-CM | POA: Diagnosis not present

## 2014-03-03 DIAGNOSIS — R198 Other specified symptoms and signs involving the digestive system and abdomen: Secondary | ICD-10-CM | POA: Diagnosis not present

## 2014-03-03 DIAGNOSIS — R112 Nausea with vomiting, unspecified: Secondary | ICD-10-CM | POA: Diagnosis not present

## 2014-03-03 DIAGNOSIS — I10 Essential (primary) hypertension: Secondary | ICD-10-CM | POA: Diagnosis not present

## 2014-03-03 DIAGNOSIS — R195 Other fecal abnormalities: Secondary | ICD-10-CM | POA: Diagnosis not present

## 2014-03-03 DIAGNOSIS — R140 Abdominal distension (gaseous): Secondary | ICD-10-CM

## 2014-03-03 DIAGNOSIS — Z79899 Other long term (current) drug therapy: Secondary | ICD-10-CM | POA: Diagnosis not present

## 2014-03-03 DIAGNOSIS — G35 Multiple sclerosis: Secondary | ICD-10-CM | POA: Diagnosis not present

## 2014-03-03 DIAGNOSIS — Z8673 Personal history of transient ischemic attack (TIA), and cerebral infarction without residual deficits: Secondary | ICD-10-CM | POA: Diagnosis not present

## 2014-03-03 DIAGNOSIS — R142 Eructation: Secondary | ICD-10-CM
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
## 2014-03-03 NOTE — Patient Instructions (Addendum)
Use a glycerine suppository daily for next 3 days.  Get magnesium citrate to get bowels moving today.   Recommend Probiotic such as lactobacillis to help balance your gut.   Can use tylenol as a pain reliever.  Can take up to 3 grams or 3000mg  per day.

## 2014-03-04 ENCOUNTER — Ambulatory Visit: Payer: Medicare Other | Admitting: Family Medicine

## 2014-03-04 DIAGNOSIS — R198 Other specified symptoms and signs involving the digestive system and abdomen: Secondary | ICD-10-CM | POA: Diagnosis not present

## 2014-03-04 DIAGNOSIS — R143 Flatulence: Secondary | ICD-10-CM | POA: Diagnosis not present

## 2014-03-04 DIAGNOSIS — R1310 Dysphagia, unspecified: Secondary | ICD-10-CM | POA: Diagnosis not present

## 2014-03-04 DIAGNOSIS — K589 Irritable bowel syndrome without diarrhea: Secondary | ICD-10-CM | POA: Diagnosis not present

## 2014-03-04 DIAGNOSIS — R6881 Early satiety: Secondary | ICD-10-CM | POA: Diagnosis not present

## 2014-03-04 DIAGNOSIS — K2270 Barrett's esophagus without dysplasia: Secondary | ICD-10-CM | POA: Diagnosis not present

## 2014-03-04 DIAGNOSIS — R141 Gas pain: Secondary | ICD-10-CM | POA: Diagnosis not present

## 2014-03-04 DIAGNOSIS — K5900 Constipation, unspecified: Secondary | ICD-10-CM | POA: Diagnosis not present

## 2014-03-04 DIAGNOSIS — Z8719 Personal history of other diseases of the digestive system: Secondary | ICD-10-CM | POA: Diagnosis not present

## 2014-03-04 IMAGING — CR DG CHEST 2V
2 series · 2 of 2 positions shown · non-contrast
Comparison: 12/01/2012.

CLINICAL DATA: Shortness of breath and tightness.

CHEST - 2 VIEW

[view not recorded (1 of 2)]
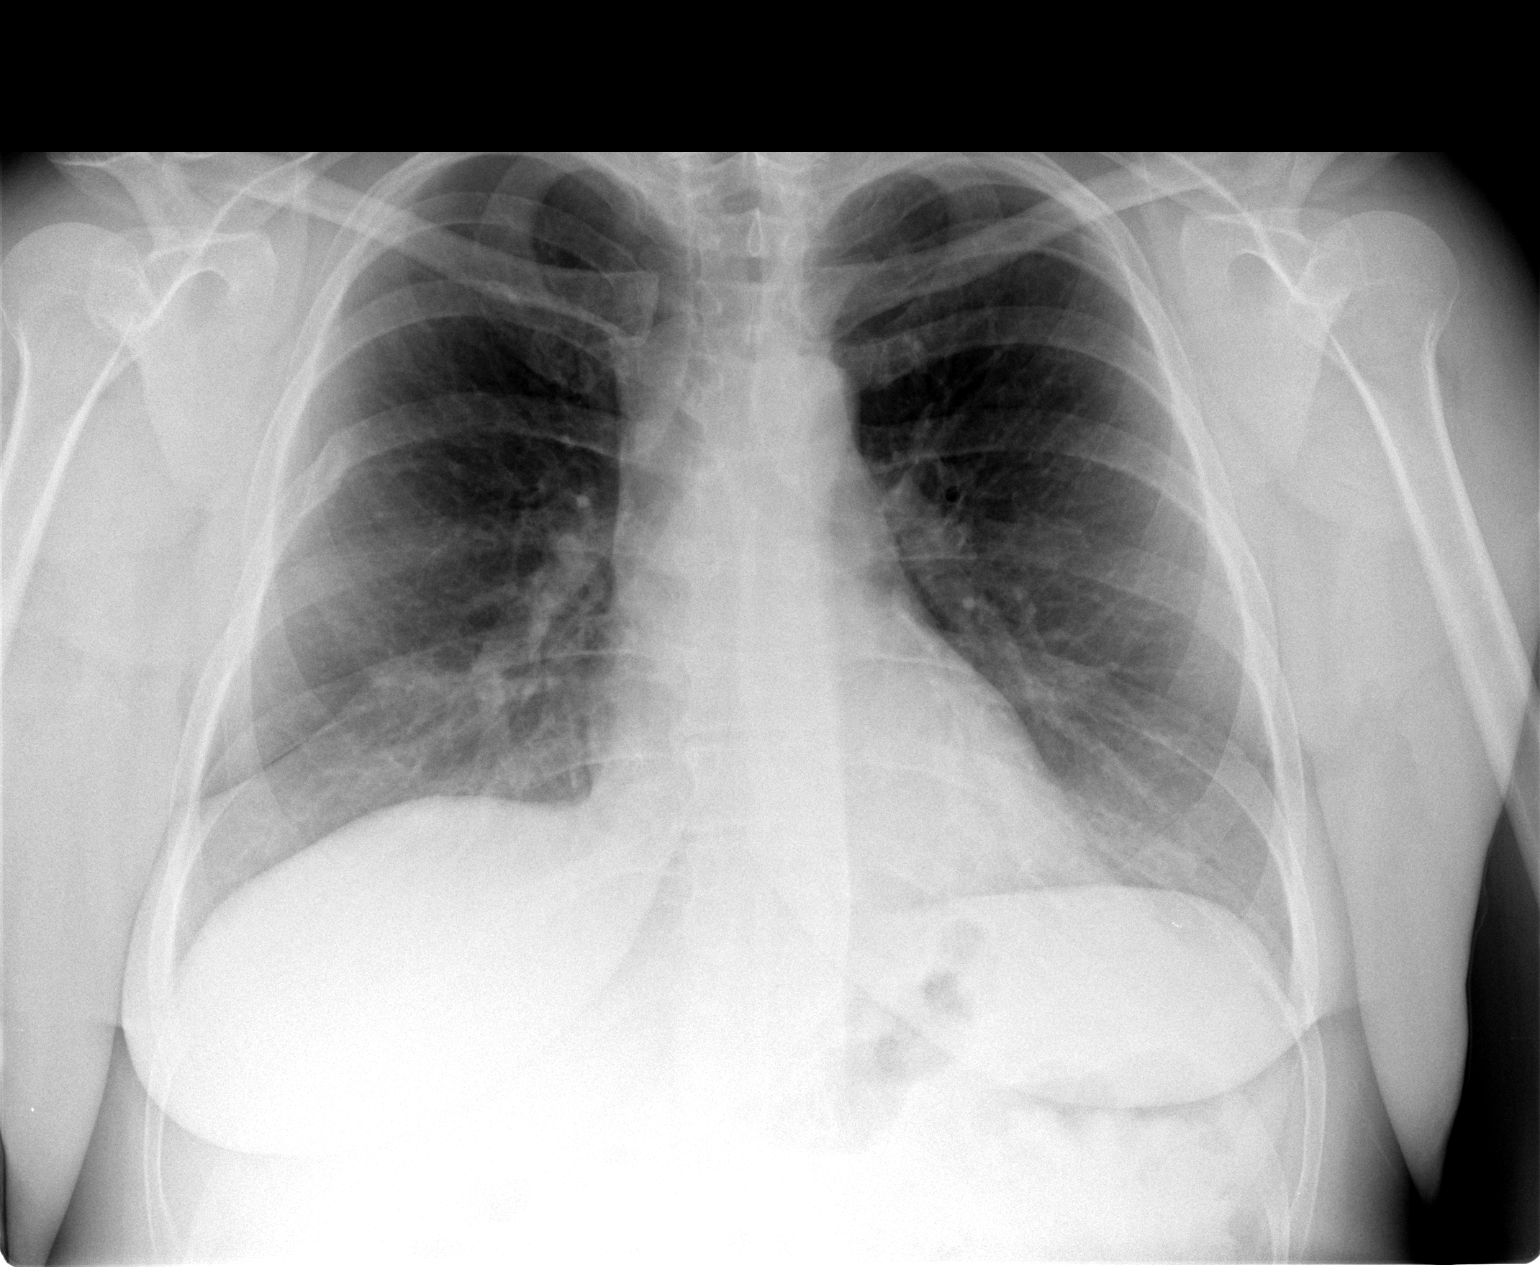

[view not recorded (2 of 2)]
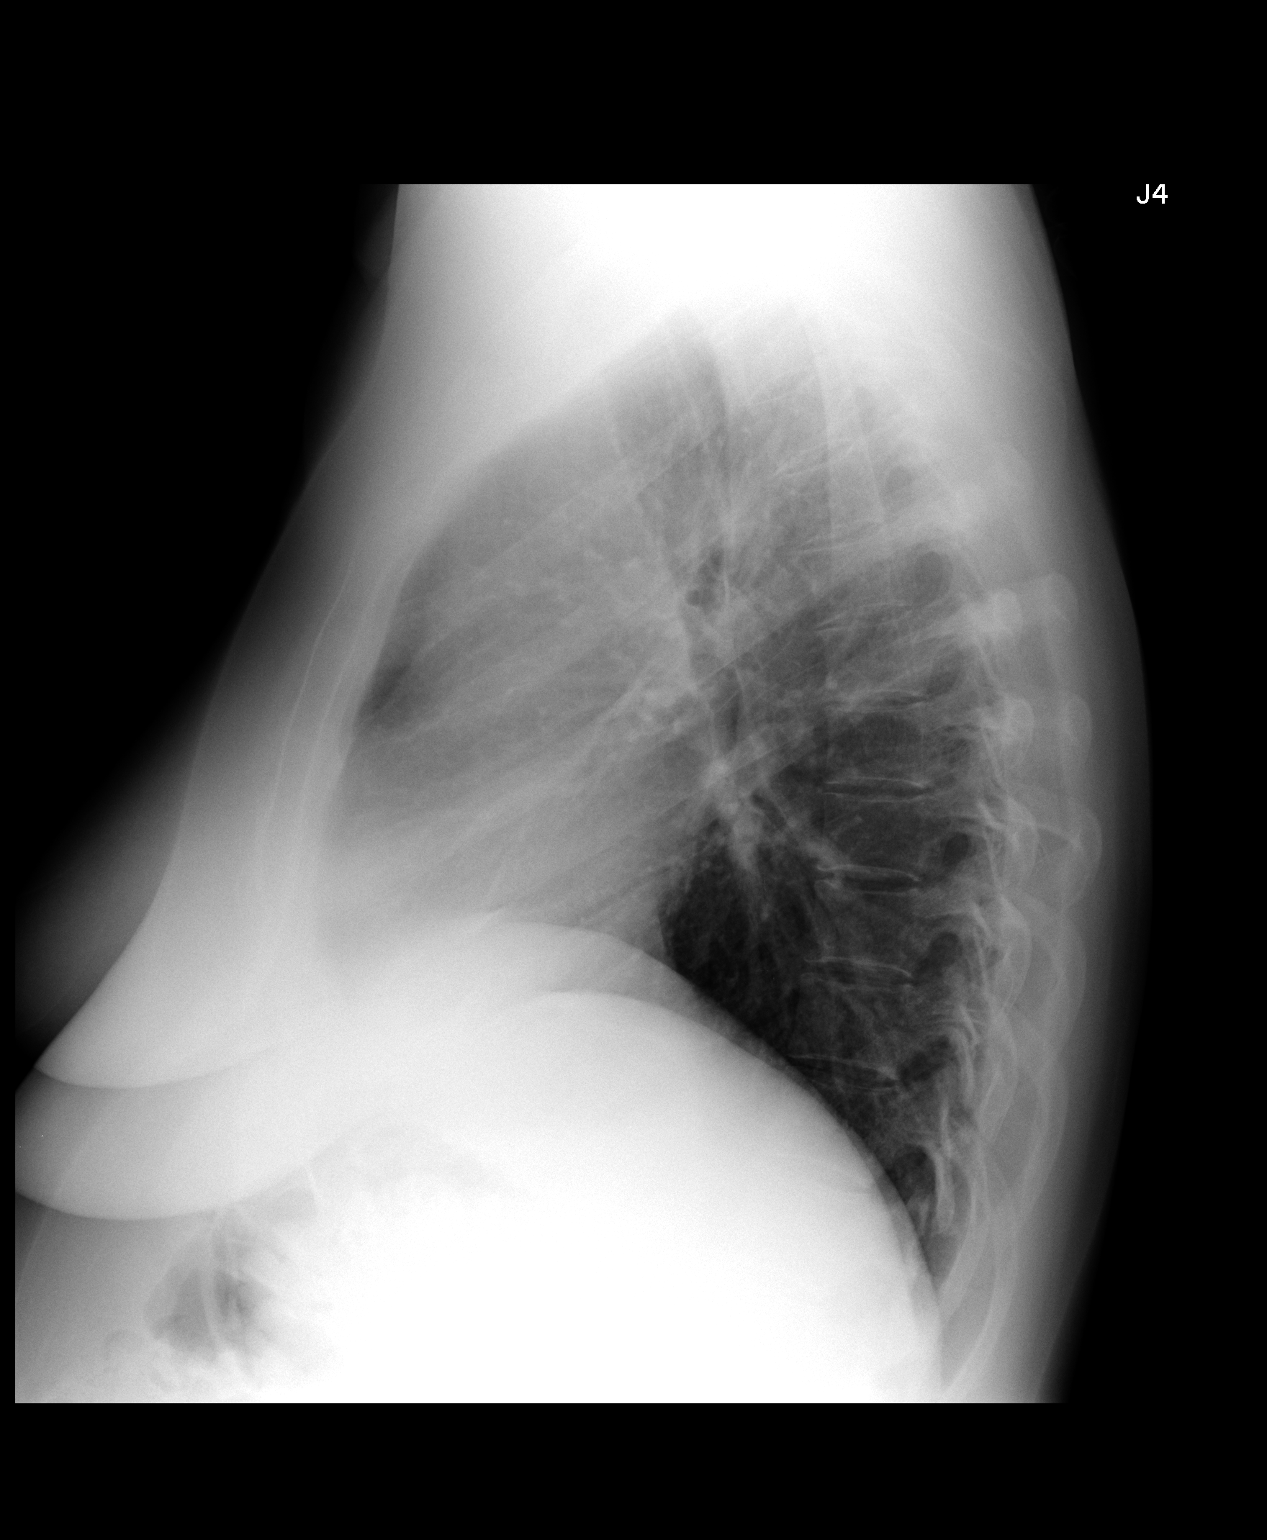

[2 of 2 positions shown; findings below may reference images not displayed]

FINDINGS: Trachea is midline.  Heart size normal.  Lungs are
somewhat low in volume but clear.  No pleural fluid.  Old right rib
fracture.
IMPRESSION: No acute findings.

## 2014-03-04 IMAGING — CR DG SHOULDER 2+V*R*
3 series · 3 of 3 positions shown · non-contrast
Comparison: 02/25/2009.

CLINICAL DATA: Right shoulder pain.

RIGHT SHOULDER - 2+ VIEW

[view not recorded (1 of 3)]
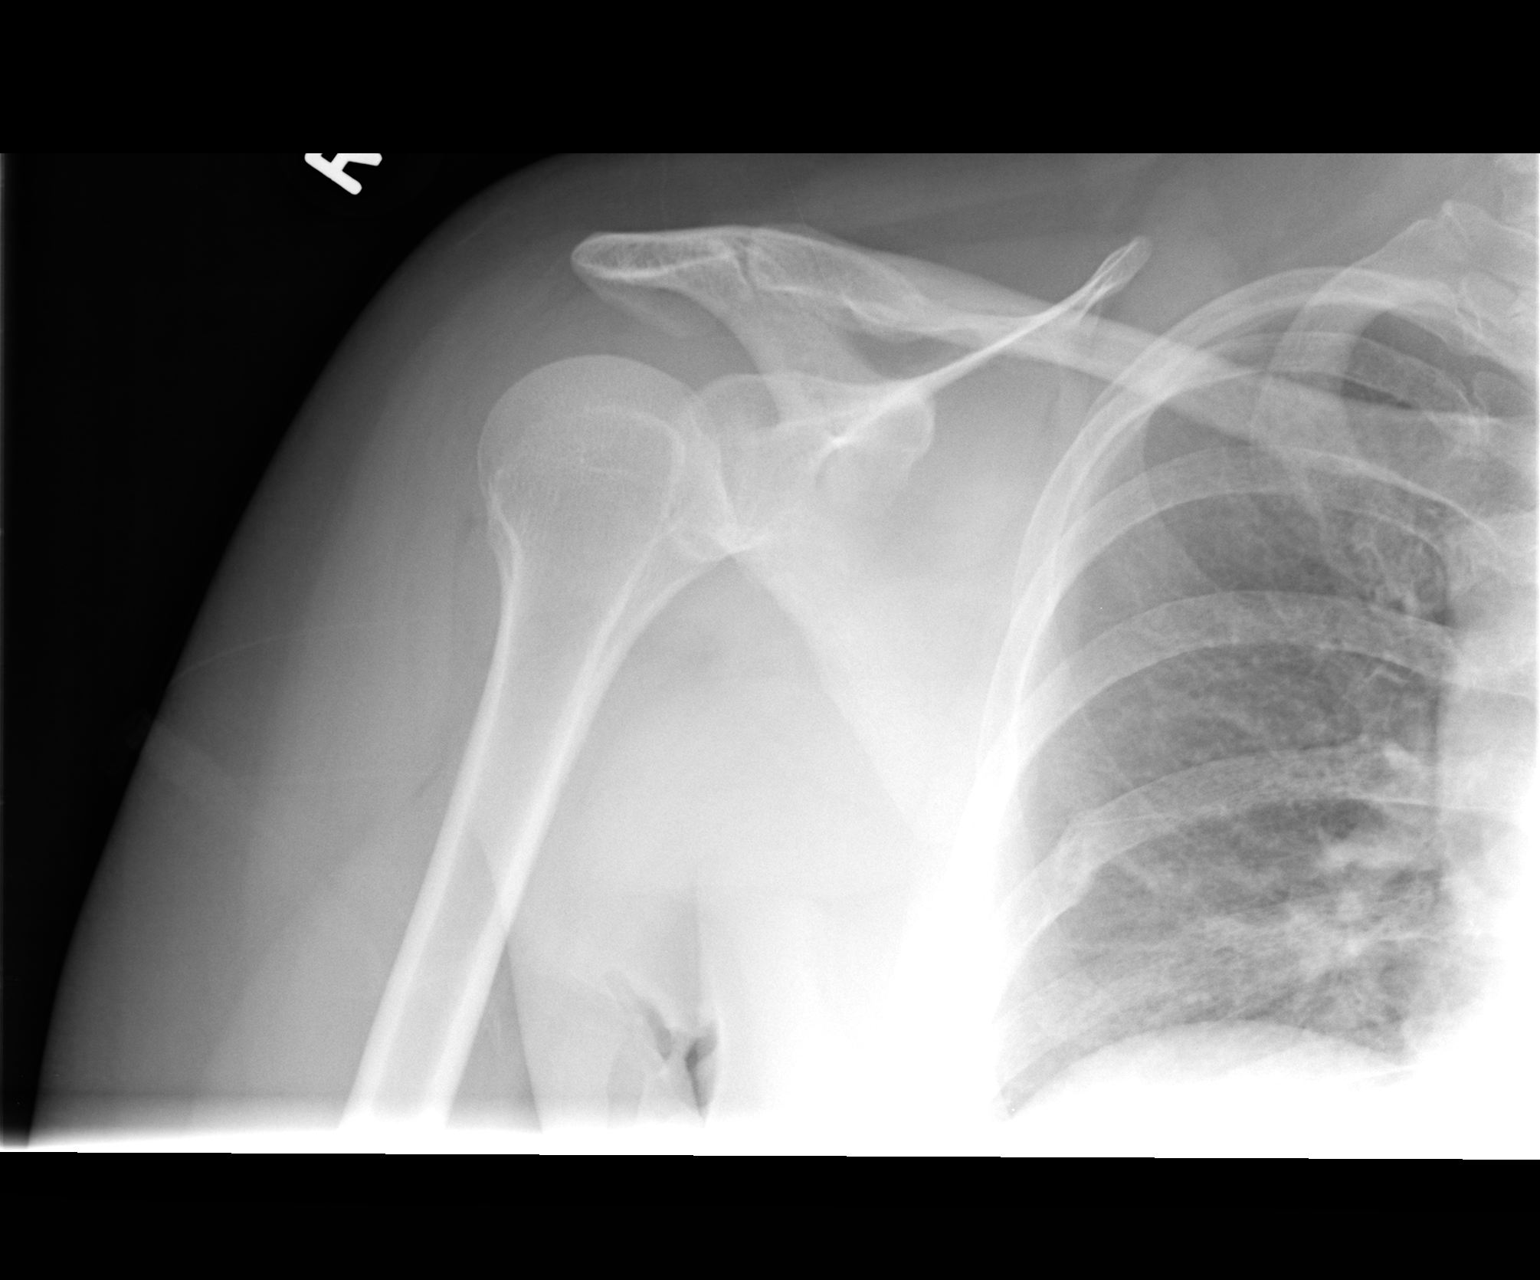

[view not recorded (2 of 3)]
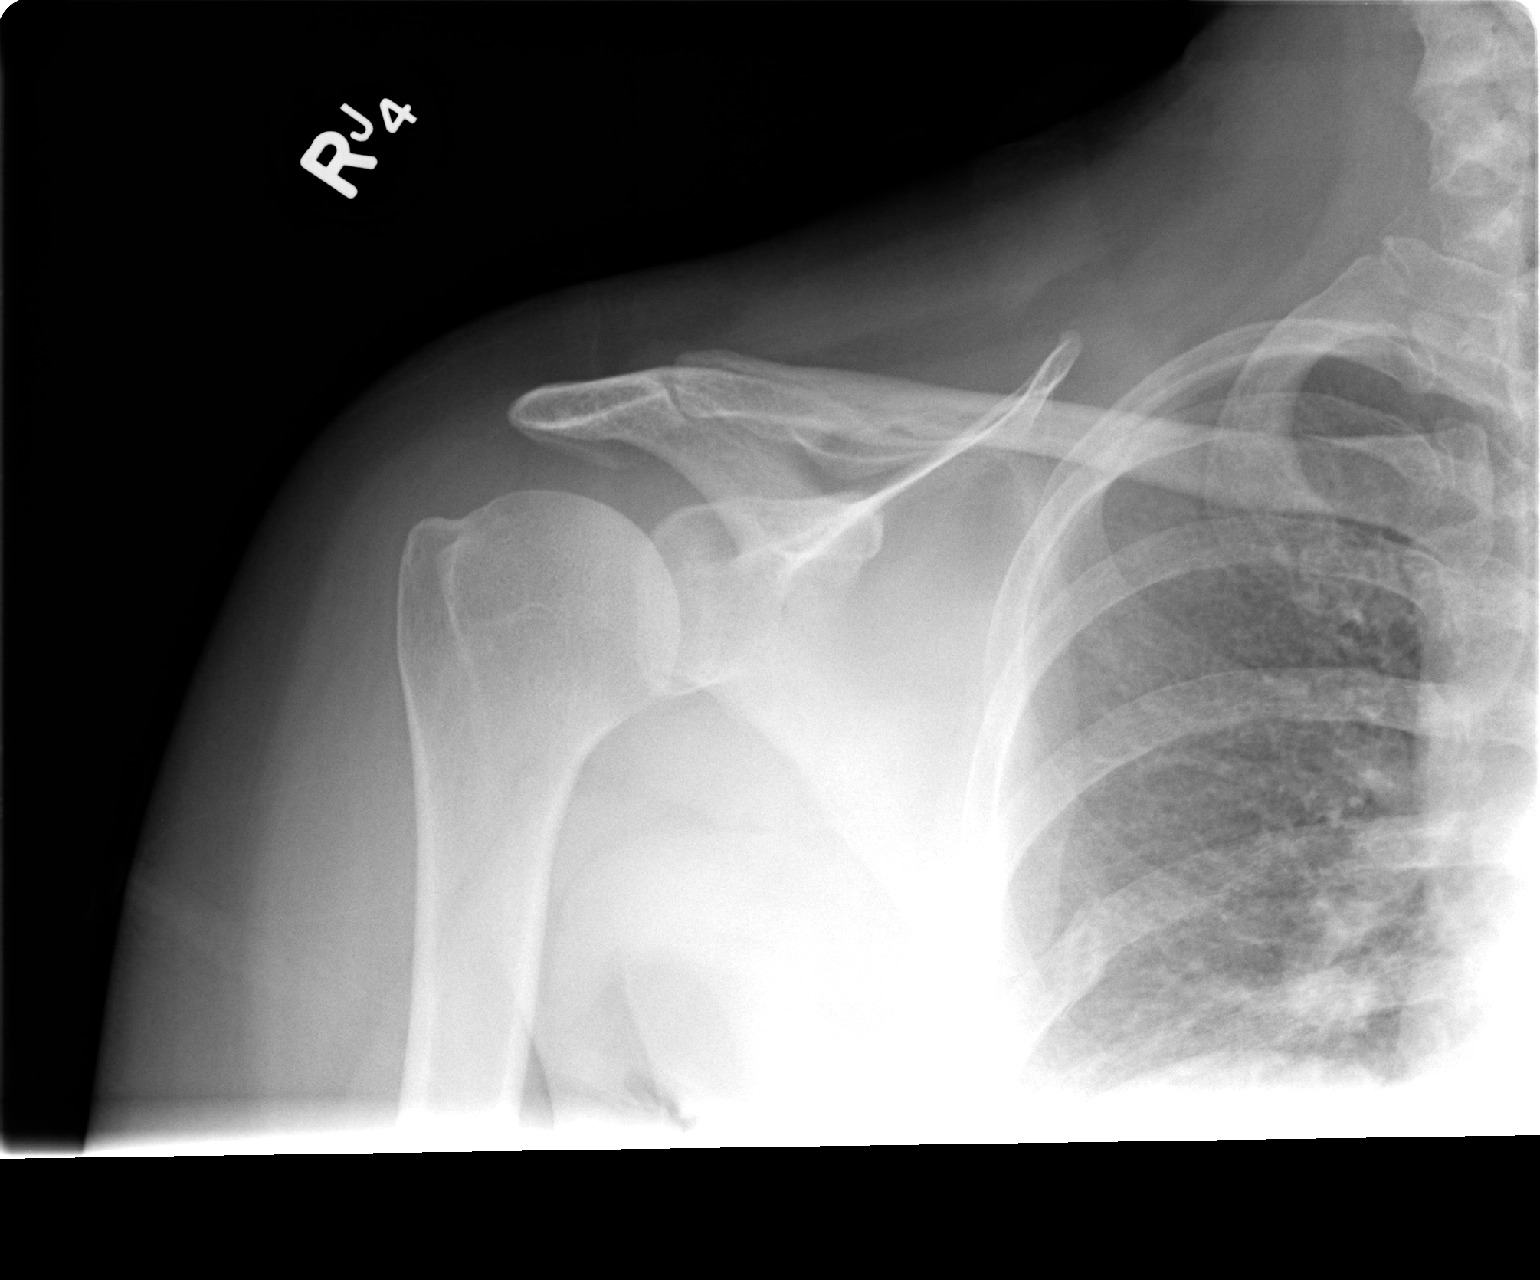

[view not recorded (3 of 3)]
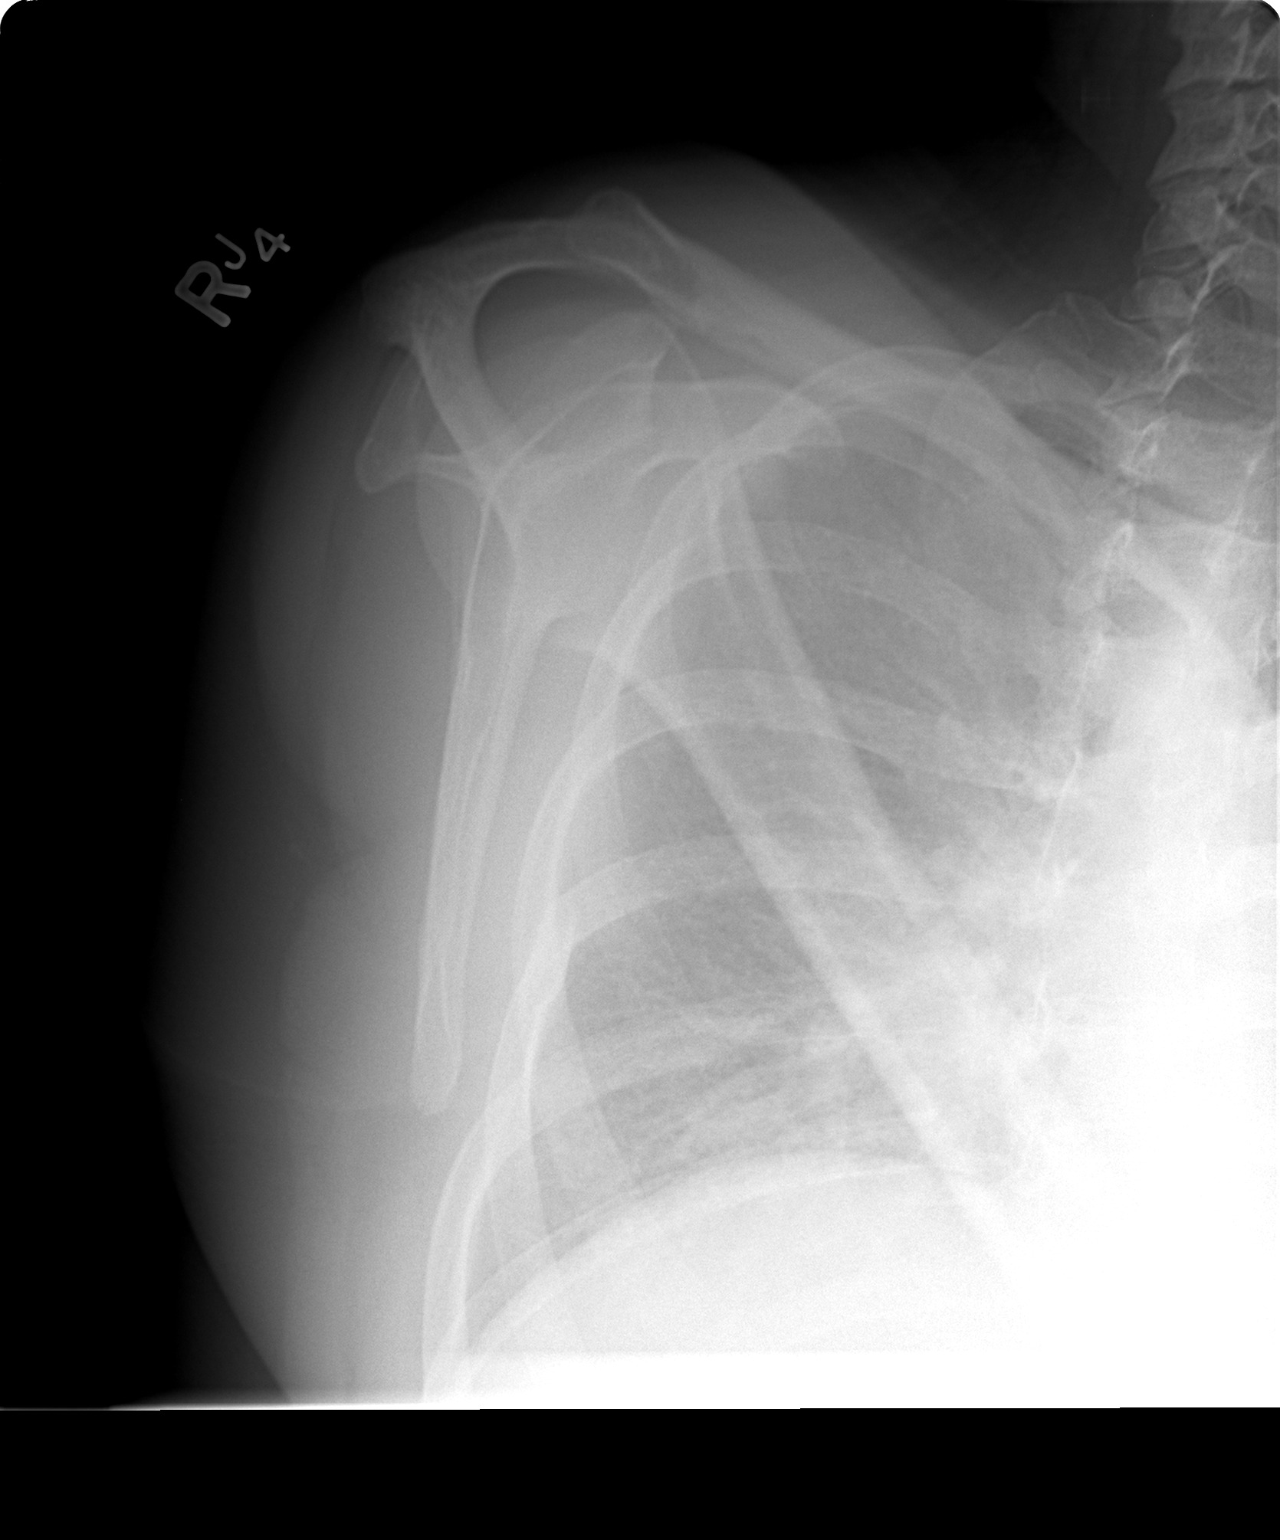

[3 of 3 positions shown; findings below may reference images not displayed]

FINDINGS: Right shoulder is unremarkable, as is the visualized
portion of the right chest, with exception of an old right rib
fracture.
IMPRESSION: No acute or degenerative findings in the right shoulder.

## 2014-03-05 ENCOUNTER — Emergency Department (HOSPITAL_BASED_OUTPATIENT_CLINIC_OR_DEPARTMENT_OTHER)
Admission: EM | Admit: 2014-03-05 | Discharge: 2014-03-05 | Disposition: A | Discharge status: Home / Self Care | Payer: Medicare Other | Attending: Emergency Medicine | Admitting: Emergency Medicine

## 2014-03-05 ENCOUNTER — Encounter (HOSPITAL_BASED_OUTPATIENT_CLINIC_OR_DEPARTMENT_OTHER): Payer: Self-pay | Admitting: Emergency Medicine

## 2014-03-05 ENCOUNTER — Emergency Department (HOSPITAL_BASED_OUTPATIENT_CLINIC_OR_DEPARTMENT_OTHER): Payer: Medicare Other

## 2014-03-05 DIAGNOSIS — Z8614 Personal history of Methicillin resistant Staphylococcus aureus infection: Secondary | ICD-10-CM | POA: Diagnosis not present

## 2014-03-05 DIAGNOSIS — Z9981 Dependence on supplemental oxygen: Secondary | ICD-10-CM | POA: Diagnosis not present

## 2014-03-05 DIAGNOSIS — Z8639 Personal history of other endocrine, nutritional and metabolic disease: Secondary | ICD-10-CM | POA: Diagnosis not present

## 2014-03-05 DIAGNOSIS — Z8542 Personal history of malignant neoplasm of other parts of uterus: Secondary | ICD-10-CM | POA: Diagnosis not present

## 2014-03-05 DIAGNOSIS — I10 Essential (primary) hypertension: Secondary | ICD-10-CM | POA: Insufficient documentation

## 2014-03-05 DIAGNOSIS — K5900 Constipation, unspecified: Secondary | ICD-10-CM | POA: Insufficient documentation

## 2014-03-05 DIAGNOSIS — Z880 Allergy status to penicillin: Secondary | ICD-10-CM | POA: Diagnosis not present

## 2014-03-05 DIAGNOSIS — G8929 Other chronic pain: Secondary | ICD-10-CM | POA: Insufficient documentation

## 2014-03-05 DIAGNOSIS — Z8742 Personal history of other diseases of the female genital tract: Secondary | ICD-10-CM | POA: Diagnosis not present

## 2014-03-05 DIAGNOSIS — K219 Gastro-esophageal reflux disease without esophagitis: Secondary | ICD-10-CM | POA: Insufficient documentation

## 2014-03-05 DIAGNOSIS — G35 Multiple sclerosis: Secondary | ICD-10-CM | POA: Insufficient documentation

## 2014-03-05 DIAGNOSIS — Z87891 Personal history of nicotine dependence: Secondary | ICD-10-CM | POA: Insufficient documentation

## 2014-03-05 DIAGNOSIS — Z79899 Other long term (current) drug therapy: Secondary | ICD-10-CM | POA: Diagnosis not present

## 2014-03-05 DIAGNOSIS — Z862 Personal history of diseases of the blood and blood-forming organs and certain disorders involving the immune mechanism: Secondary | ICD-10-CM | POA: Insufficient documentation

## 2014-03-05 DIAGNOSIS — G4730 Sleep apnea, unspecified: Secondary | ICD-10-CM | POA: Diagnosis not present

## 2014-03-05 DIAGNOSIS — R112 Nausea with vomiting, unspecified: Secondary | ICD-10-CM | POA: Diagnosis not present

## 2014-03-05 DIAGNOSIS — R1013 Epigastric pain: Secondary | ICD-10-CM | POA: Diagnosis not present

## 2014-03-05 DIAGNOSIS — J45909 Unspecified asthma, uncomplicated: Secondary | ICD-10-CM | POA: Diagnosis not present

## 2014-03-05 DIAGNOSIS — Z8669 Personal history of other diseases of the nervous system and sense organs: Secondary | ICD-10-CM | POA: Diagnosis not present

## 2014-03-05 DIAGNOSIS — Z9889 Other specified postprocedural states: Secondary | ICD-10-CM | POA: Insufficient documentation

## 2014-03-05 HISTORY — DX: Multiple sclerosis: G35

## 2014-03-05 HISTORY — DX: Multiple sclerosis, unspecified: G35.D

## 2014-03-05 LAB — CBC WITH DIFFERENTIAL/PLATELET
Basophils Absolute: 0 10*3/uL (ref 0.0–0.1)
Basophils Relative: 0 % (ref 0–1)
Eosinophils Absolute: 0.1 10*3/uL (ref 0.0–0.7)
Eosinophils Relative: 2 % (ref 0–5)
HCT: 42 % (ref 36.0–46.0)
Hemoglobin: 13.9 g/dL (ref 12.0–15.0)
Lymphocytes Relative: 18 % (ref 12–46)
Lymphs Abs: 1.1 10*3/uL (ref 0.7–4.0)
MCH: 29.1 pg (ref 26.0–34.0)
MCHC: 33.1 g/dL (ref 30.0–36.0)
MCV: 88.1 fL (ref 78.0–100.0)
Monocytes Absolute: 0.5 10*3/uL (ref 0.1–1.0)
Monocytes Relative: 8 % (ref 3–12)
Neutro Abs: 4.3 10*3/uL (ref 1.7–7.7)
Neutrophils Relative %: 72 % (ref 43–77)
Platelets: 205 10*3/uL (ref 150–400)
RBC: 4.77 MIL/uL (ref 3.87–5.11)
RDW: 14.1 % (ref 11.5–15.5)
WBC: 6 10*3/uL (ref 4.0–10.5)

## 2014-03-05 LAB — COMPREHENSIVE METABOLIC PANEL
ALT: 19 U/L (ref 0–35)
AST: 15 U/L (ref 0–37)
Albumin: 4.1 g/dL (ref 3.5–5.2)
Alkaline Phosphatase: 92 U/L (ref 39–117)
Anion gap: 16 — ABNORMAL HIGH (ref 5–15)
BUN: 12 mg/dL (ref 6–23)
CO2: 25 mEq/L (ref 19–32)
Calcium: 9.8 mg/dL (ref 8.4–10.5)
Chloride: 105 mEq/L (ref 96–112)
Creatinine, Ser: 0.6 mg/dL (ref 0.50–1.10)
GFR calc Af Amer: >90 mL/min (ref >90)
GFR calc non Af Amer: >90 mL/min (ref >90)
Glucose, Bld: 145 mg/dL — ABNORMAL HIGH (ref 70–99)
Potassium: 3.9 mEq/L (ref 3.7–5.3)
Sodium: 146 mEq/L (ref 137–147)
Total Bilirubin: 0.3 mg/dL (ref 0.3–1.2)
Total Protein: 7.6 g/dL (ref 6.0–8.3)

## 2014-03-05 LAB — LIPASE, BLOOD: Lipase: 38 U/L (ref 11–59)

## 2014-03-05 MED ORDER — ONDANSETRON 8 MG PO TBDP
8.0000 mg | ORAL_TABLET | Freq: Once | ORAL | Status: AC
  Administered 2014-03-05: 8 mg via ORAL
  Filled 2014-03-05: qty 1

## 2014-03-05 NOTE — ED Provider Notes (Signed)
Medical screening examination/treatment/procedure(s) were performed by non-physician practitioner and as supervising physician I was immediately available for consultation/collaboration.   EKG Interpretation None        Delice Bison Kenlynn Houde, DO 03/05/14 [phone]

## 2014-03-05 NOTE — ED Notes (Signed)
Patient transported to X-ray 
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
## 2014-03-05 NOTE — ED Notes (Signed)
C/o constipation x 1 month. States upper abd pain started 1 day ago. States she has trouble eating and feels full all the time. States she uses fleets enema at home.

## 2014-03-05 NOTE — Discharge Instructions (Signed)

## 2014-03-06 DIAGNOSIS — F4310 Post-traumatic stress disorder, unspecified: Secondary | ICD-10-CM | POA: Diagnosis not present

## 2014-03-09 ENCOUNTER — Telehealth: Payer: Self-pay | Admitting: Family Medicine

## 2014-03-09 NOTE — Telephone Encounter (Signed)
Wants results of sleep study - and if we have it, req a machine

## 2014-03-09 NOTE — Telephone Encounter (Signed)
Please call to get more information. Ok to give her copy of sleep study

## 2014-03-10 ENCOUNTER — Telehealth: Payer: Self-pay | Admitting: *Deleted

## 2014-03-10 ENCOUNTER — Ambulatory Visit (INDEPENDENT_AMBULATORY_CARE_PROVIDER_SITE_OTHER): Payer: Medicare Other | Admitting: Family Medicine

## 2014-03-10 ENCOUNTER — Ambulatory Visit: Payer: Medicare Other | Admitting: Family Medicine

## 2014-03-10 ENCOUNTER — Encounter: Payer: Self-pay | Admitting: Family Medicine

## 2014-03-10 VITALS — BP 155/32 | HR 96 | Temp 98.2°F | Wt 185.0 lb

## 2014-03-10 DIAGNOSIS — J069 Acute upper respiratory infection, unspecified: Secondary | ICD-10-CM

## 2014-03-10 DIAGNOSIS — J029 Acute pharyngitis, unspecified: Secondary | ICD-10-CM

## 2014-03-10 DIAGNOSIS — J3489 Other specified disorders of nose and nasal sinuses: Secondary | ICD-10-CM

## 2014-03-10 DIAGNOSIS — R0981 Nasal congestion: Secondary | ICD-10-CM

## 2014-03-10 LAB — POCT RAPID STREP A (OFFICE): Rapid Strep A Screen: NEGATIVE

## 2014-03-10 NOTE — Progress Notes (Addendum)
   Subjective:    Patient ID: Jennifer [name], female    DOB: 1965-11-05, 48 y.o.   MRN: [id number]  HPI she's been coughing a little the last couple of days mostly from postnasal drip. She's worried about it and her chest.  ST x 3 days.  + yellow nasal drainage. Neck feels more swollen.  No fever, chills or sweats. No tooth pain. She is schedule for endoscopy tomorrow.   Review of Systems     Objective:   Physical Exam  Constitutional: She is oriented to person, place, and time. She appears well-developed and well-nourished.  HENT:  Head: Normocephalic and atraumatic.  Right Ear: External ear normal.  Left Ear: External ear normal.  Nose: Nose normal.  Mouth/Throat: Oropharynx is clear and moist.  TMs and canals are clear.   Eyes: Conjunctivae and EOM are normal. Pupils are equal, round, and reactive to light.  Neck: Neck supple. No thyromegaly present.  Mildly swollen anterior cervical lymph nodes in the upper cervical chain. Soft, mildly tender.  Cardiovascular: Normal rate, regular rhythm and normal heart sounds.   Pulmonary/Chest: Effort normal and breath sounds normal. She has no wheezes.  Lymphadenopathy:    She has cervical adenopathy.  Neurological: She is alert and oriented to person, place, and time.  Skin: Skin is warm and dry.  Psychiatric: She has a normal mood and affect.          Assessment & Plan:  URI - viral versus bacterial. Rapid strep test was negative. Recommend zinc lozenges to help shorten the duration of the illness. She can also use tea with honey to help soothe and cut her throat. She feels that she's getting worse, develops a fever, or is not improving then please let me know and we'll consider treatment with an antibiotic.  Mild sleep apnea-unfortunately she was unable to tolerate the CPAP. So we will attempt to try to get her evaluated and set up for BiPAP. She was unable to tolerate the pressure of the CPAP. Even at a lower pressure it was  intolerable.

## 2014-03-10 NOTE — Addendum Note (Signed)
Addended by: Narda Rutherford on: 03/10/2014 11:52 AM   Modules accepted: Orders

## 2014-03-10 NOTE — Telephone Encounter (Signed)
Left msg to call if she did not receive copy of report while at appt today. [name], CMA

## 2014-03-10 NOTE — Patient Instructions (Addendum)
Zinc lonzeges. Upper Respiratory Infection, Adult An upper respiratory infection (URI) is also sometimes known as the common cold. The upper respiratory tract includes the nose, sinuses, throat, trachea, and bronchi. Bronchi are the airways leading to the lungs. Most people improve within 1 week, but symptoms can last up to 2 weeks. A residual cough may last even longer.  CAUSES Many different viruses can infect the tissues lining the upper respiratory tract. The tissues become irritated and inflamed and often become very moist. Mucus production is also common. A cold is contagious. You can easily spread the virus to others by oral contact. This includes kissing, sharing a glass, coughing, or sneezing. Touching your mouth or nose and then touching a surface, which is then touched by another person, can also spread the virus. SYMPTOMS  Symptoms typically develop 1 to 3 days after you come in contact with a cold virus. Symptoms vary from person to person. They may include:  Runny nose.  Sneezing.  Nasal congestion.  Sinus irritation.  Sore throat.  Loss of voice (laryngitis).  Cough.  Fatigue.  Muscle aches.  Loss of appetite.  Headache.  Low-grade fever. DIAGNOSIS  You might diagnose your own cold based on familiar symptoms, since most people get a cold 2 to 3 times a year. Your caregiver can confirm this based on your exam. Most importantly, your caregiver can check that your symptoms are not due to another disease such as strep throat, sinusitis, pneumonia, asthma, or epiglottitis. Blood tests, throat tests, and X-rays are not necessary to diagnose a common cold, but they may sometimes be helpful in excluding other more serious diseases. Your caregiver will decide if any further tests are required. RISKS AND COMPLICATIONS  You may be at risk for a more severe case of the common cold if you smoke cigarettes, have chronic heart disease (such as heart failure) or lung disease (such as  asthma), or if you have a weakened immune system. The very young and very old are also at risk for more serious infections. Bacterial sinusitis, middle ear infections, and bacterial pneumonia can complicate the common cold. The common cold can worsen asthma and chronic obstructive pulmonary disease (COPD). Sometimes, these complications can require emergency medical care and may be life-threatening. PREVENTION  The best way to protect against getting a cold is to practice good hygiene. Avoid oral or hand contact with people with cold symptoms. Wash your hands often if contact occurs. There is no clear evidence that vitamin C, vitamin E, echinacea, or exercise reduces the chance of developing a cold. However, it is always recommended to get plenty of rest and practice good nutrition. TREATMENT  Treatment is directed at relieving symptoms. There is no cure. Antibiotics are not effective, because the infection is caused by a virus, not by bacteria. Treatment may include:  Increased fluid intake. Sports drinks offer valuable electrolytes, sugars, and fluids.  Breathing heated mist or steam (vaporizer or shower).  Eating chicken soup or other clear broths, and maintaining good nutrition.  Getting plenty of rest.  Using gargles or lozenges for comfort.  Controlling fevers with ibuprofen or acetaminophen as directed by your caregiver.  Increasing usage of your inhaler if you have asthma. Zinc gel and zinc lozenges, taken in the first 24 hours of the common cold, can shorten the duration and lessen the severity of symptoms. Pain medicines may help with fever, muscle aches, and throat pain. A variety of non-prescription medicines are available to treat congestion and runny nose.  Your caregiver can make recommendations and may suggest nasal or lung inhalers for other symptoms.  HOME CARE INSTRUCTIONS   Only take over-the-counter or prescription medicines for pain, discomfort, or fever as directed by your  caregiver.  Use a warm mist humidifier or inhale steam from a shower to increase air moisture. This may keep secretions moist and make it easier to breathe.  Drink enough water and fluids to keep your urine clear or pale yellow.  Rest as needed.  Return to work when your temperature has returned to normal or as your caregiver advises. You may need to stay home longer to avoid infecting others. You can also use a face mask and careful hand washing to prevent spread of the virus. SEEK MEDICAL CARE IF:   After the first few days, you feel you are getting worse rather than better.  You need your caregiver's advice about medicines to control symptoms.  You develop chills, worsening shortness of breath, or brown or red sputum. These may be signs of pneumonia.  You develop yellow or brown nasal discharge or pain in the face, especially when you bend forward. These may be signs of sinusitis.  You develop a fever, swollen neck glands, pain with swallowing, or white areas in the back of your throat. These may be signs of strep throat. SEEK IMMEDIATE MEDICAL CARE IF:   You have a fever.  You develop severe or persistent headache, ear pain, sinus pain, or chest pain.  You develop wheezing, a prolonged cough, cough up blood, or have a change in your usual mucus (if you have chronic lung disease).  You develop sore muscles or a stiff neck. Document Released: 01/24/2001 Document Revised: 10/23/2011 Document Reviewed: 11/05/2013 [facility] Patient Information 2015 [place], [place]. This information is not intended to replace advice given to you by your health care provider. Make sure you discuss any questions you have with your health care provider.

## 2014-03-10 NOTE — Telephone Encounter (Signed)
Pt called to confirm appt date//time. Pt unable to discuss her concerns at this time states " it's too embarrassing to talk about. I would like to see Dr. [name] sooner than 8/6. Discussed with pt she will not be in the office sooner. Pt advised she will try to make it to her appt coming up. No further concerns.

## 2014-03-11 DIAGNOSIS — I209 Angina pectoris, unspecified: Secondary | ICD-10-CM | POA: Diagnosis not present

## 2014-03-11 DIAGNOSIS — J45909 Unspecified asthma, uncomplicated: Secondary | ICD-10-CM | POA: Diagnosis not present

## 2014-03-11 DIAGNOSIS — R1310 Dysphagia, unspecified: Secondary | ICD-10-CM | POA: Diagnosis not present

## 2014-03-11 DIAGNOSIS — R1013 Epigastric pain: Secondary | ICD-10-CM | POA: Diagnosis not present

## 2014-03-11 DIAGNOSIS — Z79899 Other long term (current) drug therapy: Secondary | ICD-10-CM | POA: Diagnosis not present

## 2014-03-11 DIAGNOSIS — I10 Essential (primary) hypertension: Secondary | ICD-10-CM | POA: Diagnosis not present

## 2014-03-11 DIAGNOSIS — D649 Anemia, unspecified: Secondary | ICD-10-CM | POA: Diagnosis not present

## 2014-03-12 ENCOUNTER — Other Ambulatory Visit: Payer: Self-pay

## 2014-03-12 ENCOUNTER — Telehealth: Payer: Self-pay | Admitting: Family Medicine

## 2014-03-12 DIAGNOSIS — G4733 Obstructive sleep apnea (adult) (pediatric): Secondary | ICD-10-CM

## 2014-03-12 DIAGNOSIS — Z881 Allergy status to other antibiotic agents status: Secondary | ICD-10-CM | POA: Diagnosis not present

## 2014-03-12 DIAGNOSIS — G35 Multiple sclerosis: Secondary | ICD-10-CM | POA: Diagnosis not present

## 2014-03-12 DIAGNOSIS — Z882 Allergy status to sulfonamides status: Secondary | ICD-10-CM | POA: Diagnosis not present

## 2014-03-12 DIAGNOSIS — J069 Acute upper respiratory infection, unspecified: Secondary | ICD-10-CM | POA: Diagnosis not present

## 2014-03-12 DIAGNOSIS — I10 Essential (primary) hypertension: Secondary | ICD-10-CM | POA: Diagnosis not present

## 2014-03-12 DIAGNOSIS — Z880 Allergy status to penicillin: Secondary | ICD-10-CM | POA: Diagnosis not present

## 2014-03-12 DIAGNOSIS — J019 Acute sinusitis, unspecified: Secondary | ICD-10-CM | POA: Diagnosis not present

## 2014-03-12 DIAGNOSIS — J45901 Unspecified asthma with (acute) exacerbation: Secondary | ICD-10-CM | POA: Diagnosis not present

## 2014-03-12 DIAGNOSIS — R079 Chest pain, unspecified: Secondary | ICD-10-CM | POA: Diagnosis not present

## 2014-03-12 DIAGNOSIS — Z8673 Personal history of transient ischemic attack (TIA), and cerebral infarction without residual deficits: Secondary | ICD-10-CM | POA: Diagnosis not present

## 2014-03-12 MED ORDER — AMBULATORY NON FORMULARY MEDICATION: Status: DC

## 2014-03-12 NOTE — Telephone Encounter (Signed)
Patient advised. Order faxed to [place]. Fax [phone].

## 2014-03-12 NOTE — Telephone Encounter (Signed)
Ms. Winfree called. She wants to know if an Order has been placed for to get a Bi-pap.  Thank you.

## 2014-03-13 ENCOUNTER — Telehealth: Payer: Self-pay

## 2014-03-13 DIAGNOSIS — Z888 Allergy status to other drugs, medicaments and biological substances status: Secondary | ICD-10-CM | POA: Diagnosis not present

## 2014-03-13 DIAGNOSIS — F411 Generalized anxiety disorder: Secondary | ICD-10-CM | POA: Diagnosis not present

## 2014-03-13 DIAGNOSIS — J45909 Unspecified asthma, uncomplicated: Secondary | ICD-10-CM | POA: Diagnosis not present

## 2014-03-13 DIAGNOSIS — M889 Osteitis deformans of unspecified bone: Secondary | ICD-10-CM | POA: Diagnosis not present

## 2014-03-13 DIAGNOSIS — I471 Supraventricular tachycardia: Secondary | ICD-10-CM | POA: Diagnosis not present

## 2014-03-13 DIAGNOSIS — Z87891 Personal history of nicotine dependence: Secondary | ICD-10-CM | POA: Diagnosis not present

## 2014-03-13 DIAGNOSIS — Z880 Allergy status to penicillin: Secondary | ICD-10-CM | POA: Diagnosis not present

## 2014-03-13 DIAGNOSIS — I10 Essential (primary) hypertension: Secondary | ICD-10-CM | POA: Diagnosis not present

## 2014-03-13 DIAGNOSIS — Z91011 Allergy to milk products: Secondary | ICD-10-CM | POA: Diagnosis not present

## 2014-03-13 DIAGNOSIS — G43909 Migraine, unspecified, not intractable, without status migrainosus: Secondary | ICD-10-CM | POA: Diagnosis not present

## 2014-03-13 DIAGNOSIS — E785 Hyperlipidemia, unspecified: Secondary | ICD-10-CM | POA: Diagnosis not present

## 2014-03-13 DIAGNOSIS — K2270 Barrett's esophagus without dysplasia: Secondary | ICD-10-CM | POA: Diagnosis not present

## 2014-03-13 DIAGNOSIS — Z881 Allergy status to other antibiotic agents status: Secondary | ICD-10-CM | POA: Diagnosis not present

## 2014-03-13 DIAGNOSIS — D509 Iron deficiency anemia, unspecified: Secondary | ICD-10-CM | POA: Diagnosis not present

## 2014-03-13 DIAGNOSIS — G35 Multiple sclerosis: Secondary | ICD-10-CM | POA: Diagnosis not present

## 2014-03-13 DIAGNOSIS — Z79899 Other long term (current) drug therapy: Secondary | ICD-10-CM | POA: Diagnosis not present

## 2014-03-13 DIAGNOSIS — E269 Hyperaldosteronism, unspecified: Secondary | ICD-10-CM | POA: Diagnosis not present

## 2014-03-13 DIAGNOSIS — R079 Chest pain, unspecified: Secondary | ICD-10-CM | POA: Diagnosis not present

## 2014-03-13 DIAGNOSIS — Z91018 Allergy to other foods: Secondary | ICD-10-CM | POA: Diagnosis not present

## 2014-03-13 DIAGNOSIS — J069 Acute upper respiratory infection, unspecified: Secondary | ICD-10-CM | POA: Diagnosis not present

## 2014-03-13 LAB — NASAL CULTURE (N/P): Organism ID, Bacteria: NORMAL

## 2014-03-13 NOTE — Telephone Encounter (Signed)
Pt came in the office today and wanted to know her results of her nose swab. I informed her it was negative for specific bacteria. Pt stated that they found black mold in the ceiling of her apartment and she is allergic to mold. She also stated that is still having nose bleeds, a lot of since congestion, and coughing. She stated that she is on the list at her apartment building for a different apartment when it becomes available and she is also. looking for a different place to live./Jennifer [name].CMA

## 2014-03-13 NOTE — Telephone Encounter (Signed)
Her husband does not want to do that. He doesn't think anything is wrong and believes everything is fixed. I believe she is wanting an antibiotic but i tried to explain to her that if its viral an antibiotic will not get rid of it./Ayme Short,CMA

## 2014-03-13 NOTE — Telephone Encounter (Signed)
Would she be able to stay with a friend or relative for a short period of time just to get out of the apartment for awhile?

## 2014-03-13 NOTE — Telephone Encounter (Signed)
If she is not better by Monday then can call in round of doxycyline 50mg  daily for her (this is subtheraputic but she doesn't tolerate 100mg )

## 2014-03-16 DIAGNOSIS — I10 Essential (primary) hypertension: Secondary | ICD-10-CM | POA: Diagnosis not present

## 2014-03-16 DIAGNOSIS — G35 Multiple sclerosis: Secondary | ICD-10-CM | POA: Diagnosis not present

## 2014-03-16 DIAGNOSIS — M79609 Pain in unspecified limb: Secondary | ICD-10-CM | POA: Diagnosis not present

## 2014-03-16 DIAGNOSIS — M7989 Other specified soft tissue disorders: Secondary | ICD-10-CM | POA: Diagnosis not present

## 2014-03-16 DIAGNOSIS — R609 Edema, unspecified: Secondary | ICD-10-CM | POA: Diagnosis not present

## 2014-03-16 DIAGNOSIS — Z8673 Personal history of transient ischemic attack (TIA), and cerebral infarction without residual deficits: Secondary | ICD-10-CM | POA: Diagnosis not present

## 2014-03-18 ENCOUNTER — Telehealth: Payer: Self-pay | Admitting: *Deleted

## 2014-03-18 DIAGNOSIS — Z8542 Personal history of malignant neoplasm of other parts of uterus: Secondary | ICD-10-CM | POA: Diagnosis not present

## 2014-03-18 DIAGNOSIS — R000 Tachycardia, unspecified: Secondary | ICD-10-CM | POA: Diagnosis not present

## 2014-03-18 DIAGNOSIS — R599 Enlarged lymph nodes, unspecified: Secondary | ICD-10-CM | POA: Diagnosis not present

## 2014-03-18 DIAGNOSIS — G8929 Other chronic pain: Secondary | ICD-10-CM | POA: Diagnosis not present

## 2014-03-18 DIAGNOSIS — R220 Localized swelling, mass and lump, head: Secondary | ICD-10-CM | POA: Diagnosis not present

## 2014-03-18 DIAGNOSIS — R0602 Shortness of breath: Secondary | ICD-10-CM | POA: Diagnosis not present

## 2014-03-18 DIAGNOSIS — R059 Cough, unspecified: Secondary | ICD-10-CM | POA: Diagnosis not present

## 2014-03-18 DIAGNOSIS — J309 Allergic rhinitis, unspecified: Secondary | ICD-10-CM | POA: Diagnosis not present

## 2014-03-18 DIAGNOSIS — R5381 Other malaise: Secondary | ICD-10-CM | POA: Diagnosis not present

## 2014-03-18 DIAGNOSIS — M549 Dorsalgia, unspecified: Secondary | ICD-10-CM | POA: Diagnosis not present

## 2014-03-18 DIAGNOSIS — G35 Multiple sclerosis: Secondary | ICD-10-CM | POA: Diagnosis not present

## 2014-03-18 DIAGNOSIS — Z87891 Personal history of nicotine dependence: Secondary | ICD-10-CM | POA: Diagnosis not present

## 2014-03-18 DIAGNOSIS — J45909 Unspecified asthma, uncomplicated: Secondary | ICD-10-CM | POA: Diagnosis not present

## 2014-03-18 DIAGNOSIS — J029 Acute pharyngitis, unspecified: Secondary | ICD-10-CM | POA: Diagnosis not present

## 2014-03-18 DIAGNOSIS — Z77120 Contact with and (suspected) exposure to mold (toxic): Secondary | ICD-10-CM | POA: Diagnosis not present

## 2014-03-18 DIAGNOSIS — K7689 Other specified diseases of liver: Secondary | ICD-10-CM | POA: Diagnosis not present

## 2014-03-18 DIAGNOSIS — I10 Essential (primary) hypertension: Secondary | ICD-10-CM | POA: Diagnosis not present

## 2014-03-18 DIAGNOSIS — Z8673 Personal history of transient ischemic attack (TIA), and cerebral infarction without residual deficits: Secondary | ICD-10-CM | POA: Diagnosis not present

## 2014-03-18 DIAGNOSIS — R569 Unspecified convulsions: Secondary | ICD-10-CM | POA: Diagnosis not present

## 2014-03-18 DIAGNOSIS — IMO0001 Reserved for inherently not codable concepts without codable children: Secondary | ICD-10-CM | POA: Diagnosis not present

## 2014-03-18 DIAGNOSIS — R05 Cough: Secondary | ICD-10-CM | POA: Diagnosis not present

## 2014-03-18 NOTE — Telephone Encounter (Addendum)
Pt called and stated that she was at the ED at [facility] and her Lymph nodes are swollen on x ray. She said she went there because her throat was bothering her and they took x rays of her neck and chest. She reports not feeling well but she is not running a fever and states that they did not take any labs and would like to be seen. I told her that we only have 3 providers and I don't have any openings today. Pt went on and on about the mole in her apartment and how her leasing office is not helping her to move out of her current apartment. She stated that she received a letter from her allergist stating that she has a mold allergy and she reports that she had showed it to someone here and thought that it was scanned into her chart. I told her that I did not see it in her chart. She continued to complain about how badly she felt and continued to ask me should she be on an ABX I told her that if she was not given an ABX at the ED they didn't feel that she needed one. She said that she felt like she needed to come in I reiterated to her that we did not have any openings today and that we are not fully staffed. I gave her an appt for Monday 8/10 @ 2 p.m. And she understands that this is an acute slot. I also stressed to her that should she begin to get a fever, experience any SOB, or trouble swallowing to go DIRECTLY TO THE ED pt voiced understanding and agreed. Time spent 40 mins.[name] Pringle

## 2014-03-19 ENCOUNTER — Encounter: Payer: Self-pay | Admitting: Gynecologic Oncology

## 2014-03-19 ENCOUNTER — Ambulatory Visit: Payer: Medicare Other | Attending: Gynecologic Oncology | Admitting: Gynecologic Oncology

## 2014-03-19 VITALS — BP 154/97 | HR 106 | Temp 98.6°F | Resp 16 | Ht 62.0 in | Wt 183.9 lb

## 2014-03-19 DIAGNOSIS — Z90710 Acquired absence of both cervix and uterus: Secondary | ICD-10-CM | POA: Diagnosis not present

## 2014-03-19 DIAGNOSIS — J45909 Unspecified asthma, uncomplicated: Secondary | ICD-10-CM | POA: Insufficient documentation

## 2014-03-19 DIAGNOSIS — E785 Hyperlipidemia, unspecified: Secondary | ICD-10-CM | POA: Diagnosis not present

## 2014-03-19 DIAGNOSIS — Z881 Allergy status to other antibiotic agents status: Secondary | ICD-10-CM | POA: Insufficient documentation

## 2014-03-19 DIAGNOSIS — A4902 Methicillin resistant Staphylococcus aureus infection, unspecified site: Secondary | ICD-10-CM | POA: Diagnosis not present

## 2014-03-19 DIAGNOSIS — K219 Gastro-esophageal reflux disease without esophagitis: Secondary | ICD-10-CM | POA: Insufficient documentation

## 2014-03-19 DIAGNOSIS — Z87891 Personal history of nicotine dependence: Secondary | ICD-10-CM | POA: Diagnosis not present

## 2014-03-19 DIAGNOSIS — G4730 Sleep apnea, unspecified: Secondary | ICD-10-CM | POA: Diagnosis not present

## 2014-03-19 DIAGNOSIS — G35 Multiple sclerosis: Secondary | ICD-10-CM | POA: Insufficient documentation

## 2014-03-19 DIAGNOSIS — Z79899 Other long term (current) drug therapy: Secondary | ICD-10-CM | POA: Insufficient documentation

## 2014-03-19 DIAGNOSIS — M889 Osteitis deformans of unspecified bone: Secondary | ICD-10-CM | POA: Diagnosis not present

## 2014-03-19 DIAGNOSIS — D649 Anemia, unspecified: Secondary | ICD-10-CM | POA: Diagnosis not present

## 2014-03-19 DIAGNOSIS — Z886 Allergy status to analgesic agent status: Secondary | ICD-10-CM | POA: Diagnosis not present

## 2014-03-19 DIAGNOSIS — C549 Malignant neoplasm of corpus uteri, unspecified: Secondary | ICD-10-CM

## 2014-03-19 DIAGNOSIS — Z9079 Acquired absence of other genital organ(s): Secondary | ICD-10-CM | POA: Insufficient documentation

## 2014-03-19 DIAGNOSIS — Z880 Allergy status to penicillin: Secondary | ICD-10-CM | POA: Diagnosis not present

## 2014-03-19 DIAGNOSIS — E269 Hyperaldosteronism, unspecified: Secondary | ICD-10-CM | POA: Diagnosis not present

## 2014-03-19 DIAGNOSIS — E559 Vitamin D deficiency, unspecified: Secondary | ICD-10-CM | POA: Diagnosis not present

## 2014-03-19 DIAGNOSIS — Z882 Allergy status to sulfonamides status: Secondary | ICD-10-CM | POA: Diagnosis not present

## 2014-03-19 DIAGNOSIS — Z888 Allergy status to other drugs, medicaments and biological substances status: Secondary | ICD-10-CM | POA: Diagnosis not present

## 2014-03-19 DIAGNOSIS — Z8542 Personal history of malignant neoplasm of other parts of uterus: Secondary | ICD-10-CM | POA: Insufficient documentation

## 2014-03-19 DIAGNOSIS — C541 Malignant neoplasm of endometrium: Secondary | ICD-10-CM
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
## 2014-03-19 NOTE — Patient Instructions (Signed)
Follow up in 6 months 

## 2014-03-20 DIAGNOSIS — K219 Gastro-esophageal reflux disease without esophagitis: Secondary | ICD-10-CM | POA: Diagnosis not present

## 2014-03-20 DIAGNOSIS — G40909 Epilepsy, unspecified, not intractable, without status epilepticus: Secondary | ICD-10-CM | POA: Diagnosis not present

## 2014-03-20 DIAGNOSIS — Z79899 Other long term (current) drug therapy: Secondary | ICD-10-CM | POA: Diagnosis not present

## 2014-03-20 DIAGNOSIS — K2270 Barrett's esophagus without dysplasia: Secondary | ICD-10-CM | POA: Diagnosis not present

## 2014-03-20 DIAGNOSIS — Z888 Allergy status to other drugs, medicaments and biological substances status: Secondary | ICD-10-CM | POA: Diagnosis not present

## 2014-03-20 DIAGNOSIS — R51 Headache: Secondary | ICD-10-CM | POA: Diagnosis not present

## 2014-03-20 DIAGNOSIS — I471 Supraventricular tachycardia: Secondary | ICD-10-CM | POA: Diagnosis not present

## 2014-03-20 DIAGNOSIS — R1310 Dysphagia, unspecified: Secondary | ICD-10-CM | POA: Diagnosis not present

## 2014-03-20 DIAGNOSIS — R05 Cough: Secondary | ICD-10-CM | POA: Diagnosis not present

## 2014-03-20 DIAGNOSIS — I10 Essential (primary) hypertension: Secondary | ICD-10-CM | POA: Diagnosis not present

## 2014-03-20 DIAGNOSIS — Z87891 Personal history of nicotine dependence: Secondary | ICD-10-CM | POA: Diagnosis not present

## 2014-03-20 DIAGNOSIS — Z881 Allergy status to other antibiotic agents status: Secondary | ICD-10-CM | POA: Diagnosis not present

## 2014-03-20 DIAGNOSIS — R070 Pain in throat: Secondary | ICD-10-CM | POA: Diagnosis not present

## 2014-03-20 DIAGNOSIS — G35 Multiple sclerosis: Secondary | ICD-10-CM | POA: Diagnosis not present

## 2014-03-20 DIAGNOSIS — Z880 Allergy status to penicillin: Secondary | ICD-10-CM | POA: Diagnosis not present

## 2014-03-20 DIAGNOSIS — R059 Cough, unspecified: Secondary | ICD-10-CM | POA: Diagnosis not present

## 2014-03-20 DIAGNOSIS — Z885 Allergy status to narcotic agent status: Secondary | ICD-10-CM | POA: Diagnosis not present

## 2014-03-20 DIAGNOSIS — Z886 Allergy status to analgesic agent status: Secondary | ICD-10-CM | POA: Diagnosis not present

## 2014-03-23 ENCOUNTER — Ambulatory Visit (INDEPENDENT_AMBULATORY_CARE_PROVIDER_SITE_OTHER): Payer: Medicare Other | Admitting: Family Medicine

## 2014-03-23 ENCOUNTER — Other Ambulatory Visit: Payer: Self-pay | Admitting: Family Medicine

## 2014-03-23 ENCOUNTER — Encounter: Payer: Self-pay | Admitting: Family Medicine

## 2014-03-23 VITALS — BP 139/85 | HR 91 | Temp 98.0°F | Wt 185.0 lb

## 2014-03-23 DIAGNOSIS — J018 Other acute sinusitis: Secondary | ICD-10-CM

## 2014-03-23 DIAGNOSIS — R1011 Right upper quadrant pain: Secondary | ICD-10-CM | POA: Diagnosis not present

## 2014-03-23 DIAGNOSIS — R220 Localized swelling, mass and lump, head: Secondary | ICD-10-CM | POA: Diagnosis not present

## 2014-03-23 DIAGNOSIS — R599 Enlarged lymph nodes, unspecified: Secondary | ICD-10-CM | POA: Diagnosis not present

## 2014-03-23 DIAGNOSIS — R221 Localized swelling, mass and lump, neck: Secondary | ICD-10-CM

## 2014-03-23 DIAGNOSIS — J0140 Acute pansinusitis, unspecified: Secondary | ICD-10-CM

## 2014-03-23 LAB — COMPLETE METABOLIC PANEL WITH GFR
ALT: 23 U/L (ref 0–35)
ALT: 23 U/L (ref 0–35)
AST: 17 U/L (ref 0–37)
AST: 17 U/L (ref 0–37)
Albumin: 3.8 g/dL (ref 3.5–5.2)
Albumin: 3.8 g/dL (ref 3.5–5.2)
Alkaline Phosphatase: 94 U/L (ref 39–117)
Alkaline Phosphatase: 94 U/L (ref 39–117)
BUN: 15 mg/dL (ref 6–23)
BUN: 15 mg/dL (ref 6–23)
CO2: 24 mEq/L (ref 19–32)
CO2: 24 mEq/L (ref 19–32)
Calcium: 9.4 mg/dL (ref 8.4–10.5)
Calcium: 9.4 mg/dL (ref 8.4–10.5)
Chloride: 104 mEq/L (ref 96–112)
Chloride: 104 mEq/L (ref 96–112)
Creat: 0.67 mg/dL (ref 0.50–1.10)
Creat: 0.67 mg/dL (ref 0.50–1.10)
GFR, Est African American: >89 mL/min
GFR, Est African American: >89 mL/min
GFR, Est Non African American: >89 mL/min
GFR, Est Non African American: >89 mL/min
Glucose, Bld: 109 mg/dL — ABNORMAL HIGH (ref 70–99)
Glucose, Bld: 109 mg/dL — ABNORMAL HIGH (ref 70–99)
Potassium: 4.1 mEq/L (ref 3.5–5.3)
Potassium: 4.1 mEq/L (ref 3.5–5.3)
Sodium: 145 mEq/L (ref 135–145)
Sodium: 145 mEq/L (ref 135–145)
Total Bilirubin: 0.2 mg/dL (ref 0.2–1.2)
Total Bilirubin: 0.2 mg/dL (ref 0.2–1.2)
Total Protein: 7.1 g/dL (ref 6.0–8.3)
Total Protein: 7.1 g/dL (ref 6.0–8.3)

## 2014-03-23 LAB — CBC WITH DIFFERENTIAL/PLATELET
Basophils Absolute: 0.1 10*3/uL (ref 0.0–0.1)
Basophils Relative: 1 % (ref 0–1)
Eosinophils Absolute: 0.1 10*3/uL (ref 0.0–0.7)
Eosinophils Relative: 2 % (ref 0–5)
HCT: 40.6 % (ref 36.0–46.0)
Hemoglobin: 13.6 g/dL (ref 12.0–15.0)
Lymphocytes Relative: 22 % (ref 12–46)
Lymphs Abs: 1.3 10*3/uL (ref 0.7–4.0)
MCH: 29.4 pg (ref 26.0–34.0)
MCHC: 33.5 g/dL (ref 30.0–36.0)
MCV: 87.9 fL (ref 78.0–100.0)
Monocytes Absolute: 0.5 10*3/uL (ref 0.1–1.0)
Monocytes Relative: 8 % (ref 3–12)
Neutro Abs: 4.1 10*3/uL (ref 1.7–7.7)
Neutrophils Relative %: 67 % (ref 43–77)
Platelets: 210 10*3/uL (ref 150–400)
RBC: 4.62 MIL/uL (ref 3.87–5.11)
RDW: 14.1 % (ref 11.5–15.5)
WBC: 6 10*3/uL (ref 4.0–10.5)

## 2014-03-23 LAB — LIPASE: Lipase: 43 U/L (ref 0–75)

## 2014-03-23 LAB — AMYLASE: Amylase: 66 U/L (ref 0–105)
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
## 2014-03-23 NOTE — Patient Instructions (Signed)
Mintox

## 2014-03-24 ENCOUNTER — Ambulatory Visit (HOSPITAL_BASED_OUTPATIENT_CLINIC_OR_DEPARTMENT_OTHER)
Admission: RE | Admit: 2014-03-24 | Discharge: 2014-03-24 | Disposition: A | Discharge status: Home / Self Care | Payer: Medicare Other | Source: Ambulatory Visit | Attending: Family Medicine | Admitting: Family Medicine

## 2014-03-24 DIAGNOSIS — R221 Localized swelling, mass and lump, neck: Secondary | ICD-10-CM

## 2014-03-24 DIAGNOSIS — M542 Cervicalgia: Secondary | ICD-10-CM | POA: Diagnosis not present

## 2014-03-24 DIAGNOSIS — E041 Nontoxic single thyroid nodule: Secondary | ICD-10-CM | POA: Insufficient documentation

## 2014-03-25 DIAGNOSIS — J312 Chronic pharyngitis: Secondary | ICD-10-CM | POA: Diagnosis not present

## 2014-03-25 DIAGNOSIS — J309 Allergic rhinitis, unspecified: Secondary | ICD-10-CM | POA: Diagnosis not present

## 2014-03-25 DIAGNOSIS — Z91018 Allergy to other foods: Secondary | ICD-10-CM | POA: Diagnosis not present

## 2014-03-25 NOTE — Progress Notes (Signed)
Quick Note:  All labs are normal. ______ 

## 2014-03-26 DIAGNOSIS — G35 Multiple sclerosis: Secondary | ICD-10-CM | POA: Diagnosis not present

## 2014-03-26 DIAGNOSIS — R471 Dysarthria and anarthria: Secondary | ICD-10-CM | POA: Diagnosis not present

## 2014-03-26 DIAGNOSIS — H538 Other visual disturbances: Secondary | ICD-10-CM | POA: Diagnosis not present

## 2014-03-27 ENCOUNTER — Ambulatory Visit: Payer: Medicare Other | Admitting: Sports Medicine

## 2014-03-27 DIAGNOSIS — F4310 Post-traumatic stress disorder, unspecified: Secondary | ICD-10-CM | POA: Diagnosis not present

## 2014-03-28 DIAGNOSIS — E860 Dehydration: Secondary | ICD-10-CM | POA: Diagnosis not present

## 2014-03-28 DIAGNOSIS — R7309 Other abnormal glucose: Secondary | ICD-10-CM | POA: Diagnosis not present

## 2014-03-28 DIAGNOSIS — R0602 Shortness of breath: Secondary | ICD-10-CM | POA: Diagnosis not present

## 2014-03-28 DIAGNOSIS — Z888 Allergy status to other drugs, medicaments and biological substances status: Secondary | ICD-10-CM | POA: Diagnosis not present

## 2014-03-28 DIAGNOSIS — R002 Palpitations: Secondary | ICD-10-CM | POA: Diagnosis not present

## 2014-03-28 DIAGNOSIS — E876 Hypokalemia: Secondary | ICD-10-CM | POA: Diagnosis not present

## 2014-03-31 ENCOUNTER — Encounter: Payer: Self-pay | Admitting: Family Medicine

## 2014-03-31 ENCOUNTER — Ambulatory Visit (INDEPENDENT_AMBULATORY_CARE_PROVIDER_SITE_OTHER): Payer: Medicare Other | Admitting: Family Medicine

## 2014-03-31 ENCOUNTER — Ambulatory Visit (INDEPENDENT_AMBULATORY_CARE_PROVIDER_SITE_OTHER): Payer: Medicare Other

## 2014-03-31 VITALS — BP 141/87 | HR 104 | Wt 184.0 lb

## 2014-03-31 DIAGNOSIS — R232 Flushing: Secondary | ICD-10-CM

## 2014-03-31 DIAGNOSIS — N951 Menopausal and female climacteric states: Secondary | ICD-10-CM

## 2014-03-31 DIAGNOSIS — M19049 Primary osteoarthritis, unspecified hand: Secondary | ICD-10-CM | POA: Diagnosis not present

## 2014-03-31 DIAGNOSIS — L603 Nail dystrophy: Secondary | ICD-10-CM

## 2014-03-31 DIAGNOSIS — M79609 Pain in unspecified limb: Secondary | ICD-10-CM

## 2014-03-31 DIAGNOSIS — M25519 Pain in unspecified shoulder: Secondary | ICD-10-CM | POA: Diagnosis not present

## 2014-03-31 DIAGNOSIS — L608 Other nail disorders: Secondary | ICD-10-CM

## 2014-03-31 DIAGNOSIS — E876 Hypokalemia: Secondary | ICD-10-CM

## 2014-03-31 DIAGNOSIS — J3489 Other specified disorders of nose and nasal sinuses: Secondary | ICD-10-CM | POA: Diagnosis not present

## 2014-03-31 DIAGNOSIS — M79645 Pain in left finger(s): Secondary | ICD-10-CM

## 2014-03-31 DIAGNOSIS — M25512 Pain in left shoulder: Secondary | ICD-10-CM

## 2014-03-31 DIAGNOSIS — J029 Acute pharyngitis, unspecified: Secondary | ICD-10-CM

## 2014-03-31 DIAGNOSIS — R0981 Nasal congestion: Secondary | ICD-10-CM

## 2014-03-31 LAB — CHLORIDE: Chloride: 103 mmol/L

## 2014-03-31 LAB — CALCIUM: Calcium: 9.5 mg/dL

## 2014-03-31 LAB — BASIC METABOLIC PANEL
Creatinine: 0.5 mg/dL (ref 0.5–1.1)
Glucose: 99 mg/dL
Potassium: 3.8 mmol/L (ref 3.4–5.3)
Sodium: 143 mmol/L (ref 137–147)

## 2014-03-31 NOTE — Progress Notes (Signed)
   Subjective:    Patient ID: Jennifer [name], female    DOB: 1966/04/30, 48 y.o.   MRN: [id number]  HPI Patient went to [facility] She is diagnosed with dehydration and given IV fluids. She was also noted to have a low potassium at that time.She ws given potassium. Given rx but tabs are really too large.  Still has a sorethroat.    Did ortho about her left shoulder - they recommend surgery. She is just ot sure she is ready to do it.    Hs been having more feeling hot and flushes. This actually seemed to be improving for period time but she feels like it's come back for the last several weeks. She has a prior history of thyroid problems.  Nails have been chipping and nails. Have been more brittle than usual.  Says still feels like has a ST, thick mucous, and wants me to recheck her LN.  No fever, chills or sweats.  No facial pain or pressure. Occasional headache.  Review of Systems     Objective:   Physical Exam  Constitutional: She is oriented to person, place, and time. She appears well-developed and well-nourished.  HENT:  Head: Normocephalic and atraumatic.  Right Ear: External ear normal.  Left Ear: External ear normal.  Nose: Nose normal.  Mouth/Throat: Oropharynx is clear and moist.  TMs and canals are clear.   Eyes: Conjunctivae and EOM are normal. Pupils are equal, round, and reactive to light.  Neck: Neck supple. No thyromegaly present.  Cardiovascular: Normal rate, regular rhythm and normal heart sounds.   Pulmonary/Chest: Effort normal and breath sounds normal. She has no wheezes.  Lymphadenopathy:    She has no cervical adenopathy.  Neurological: She is alert and oriented to person, place, and time.  Skin: Skin is warm and dry.  Psychiatric: She has a normal mood and affect.          Assessment & Plan:  Hypokalemia - Due to recheck her potassium , before we put her on long-term potassium. I reassured her that the last couple times we have checked her  potassium in the last 2 months it has actually been normal.  Left shoulder pain-it's completely up to her when she decides to have surgery. Then saw her daily activities are affected her pain levels.  Hot flashes-most likely related to being postmenopausal. Tried to give her reassurance. Next  Nail changes-we could certainly cast for additional deficiencies but she's had extensive workup recently.  Excess mucus production in the sinuses and throat-right now I do not think she has an acute infection. Her ENT who she just saw about a week ago was actually doing some additional allergy testing to figure she has a 2 triggers that might be causing some swelling discomfort and excess mucus production. Her exam is actually benign today.  Time spent 45 minutes, greater than 50% time spent counseling about her sinus symptoms, hot flashes, with sharp pain, nail changes, hypokalemia, and MS.

## 2014-04-01 IMAGING — CR DG CHEST 2V
2 series · 2 of 2 positions shown · non-contrast
Comparison: 12/26/2012.

CLINICAL DATA: Throat pain, facial swelling and shortness of
breath.

CHEST - 2 VIEW

[w chest pa]
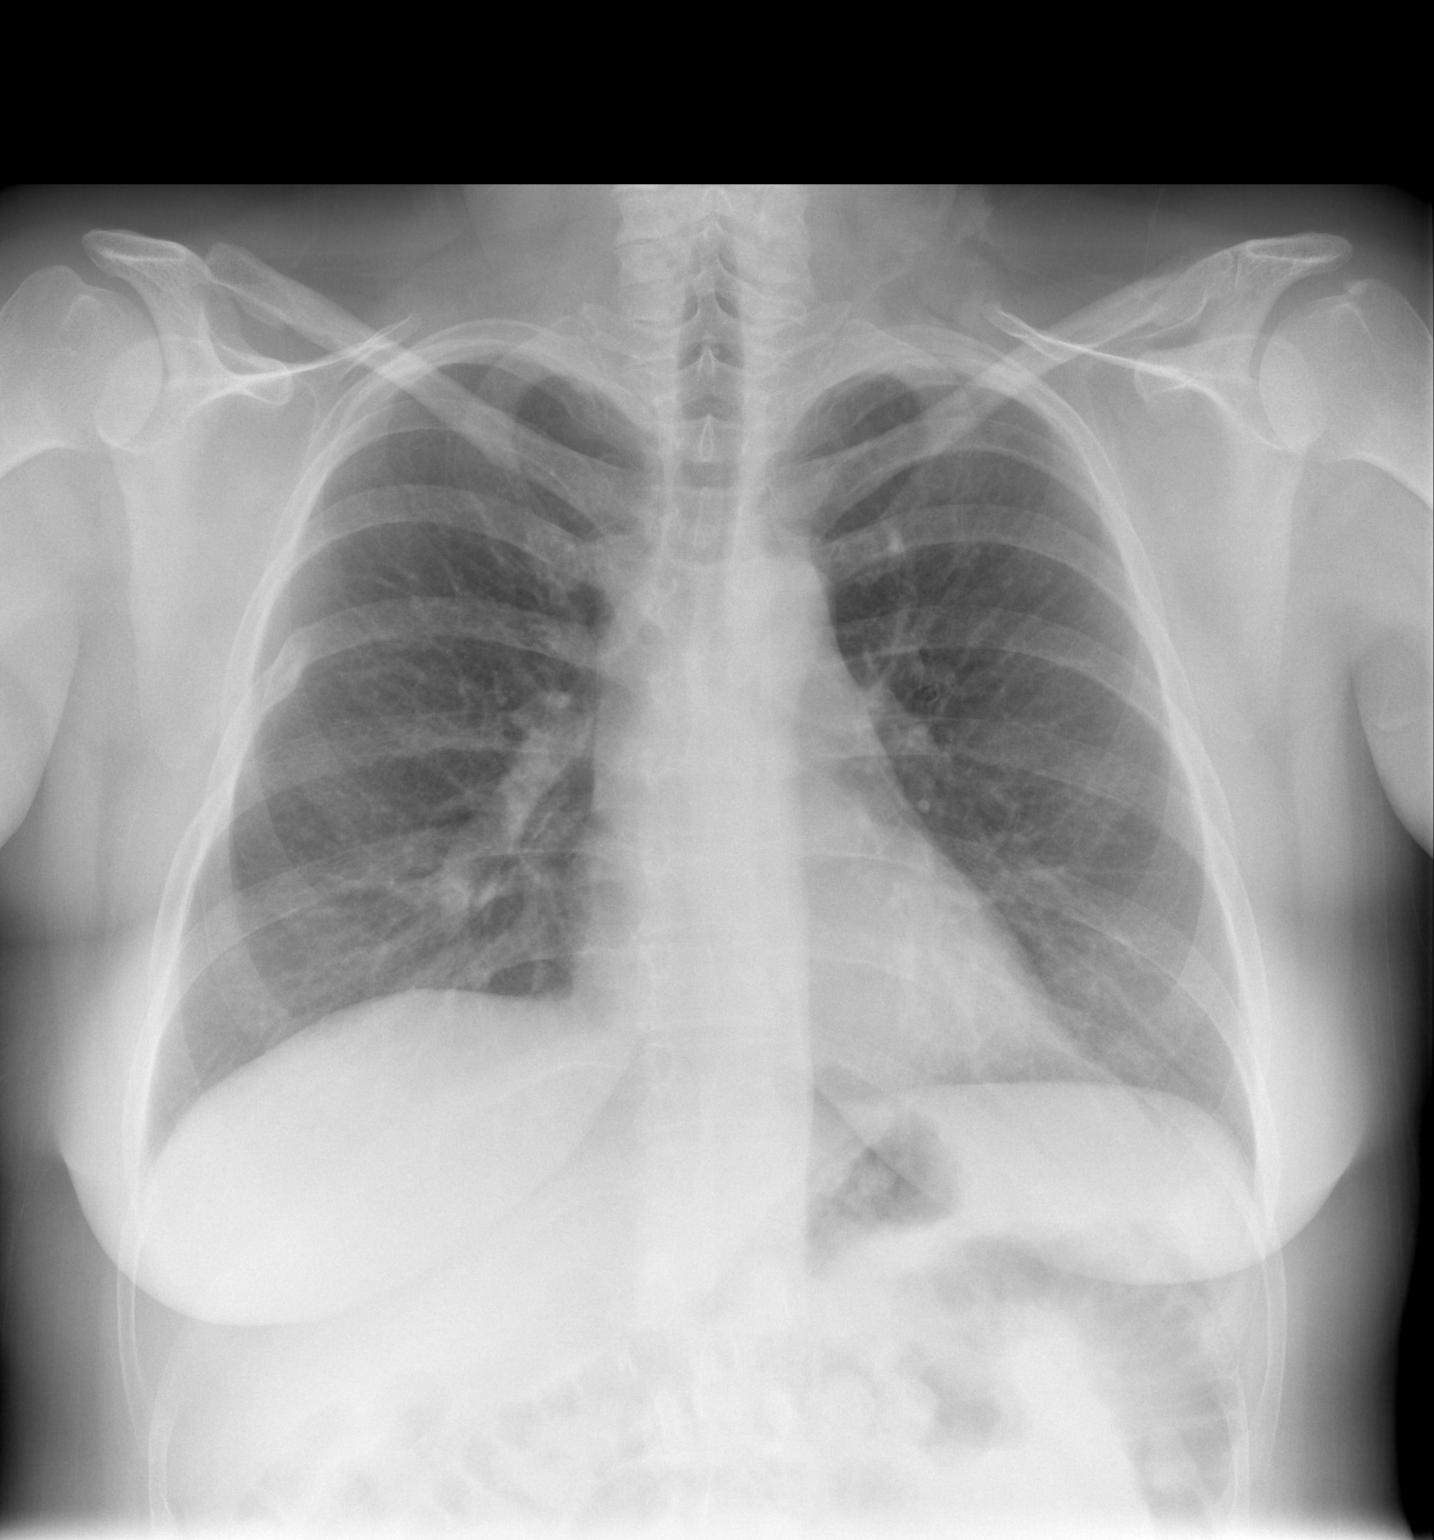

[w chest lat]
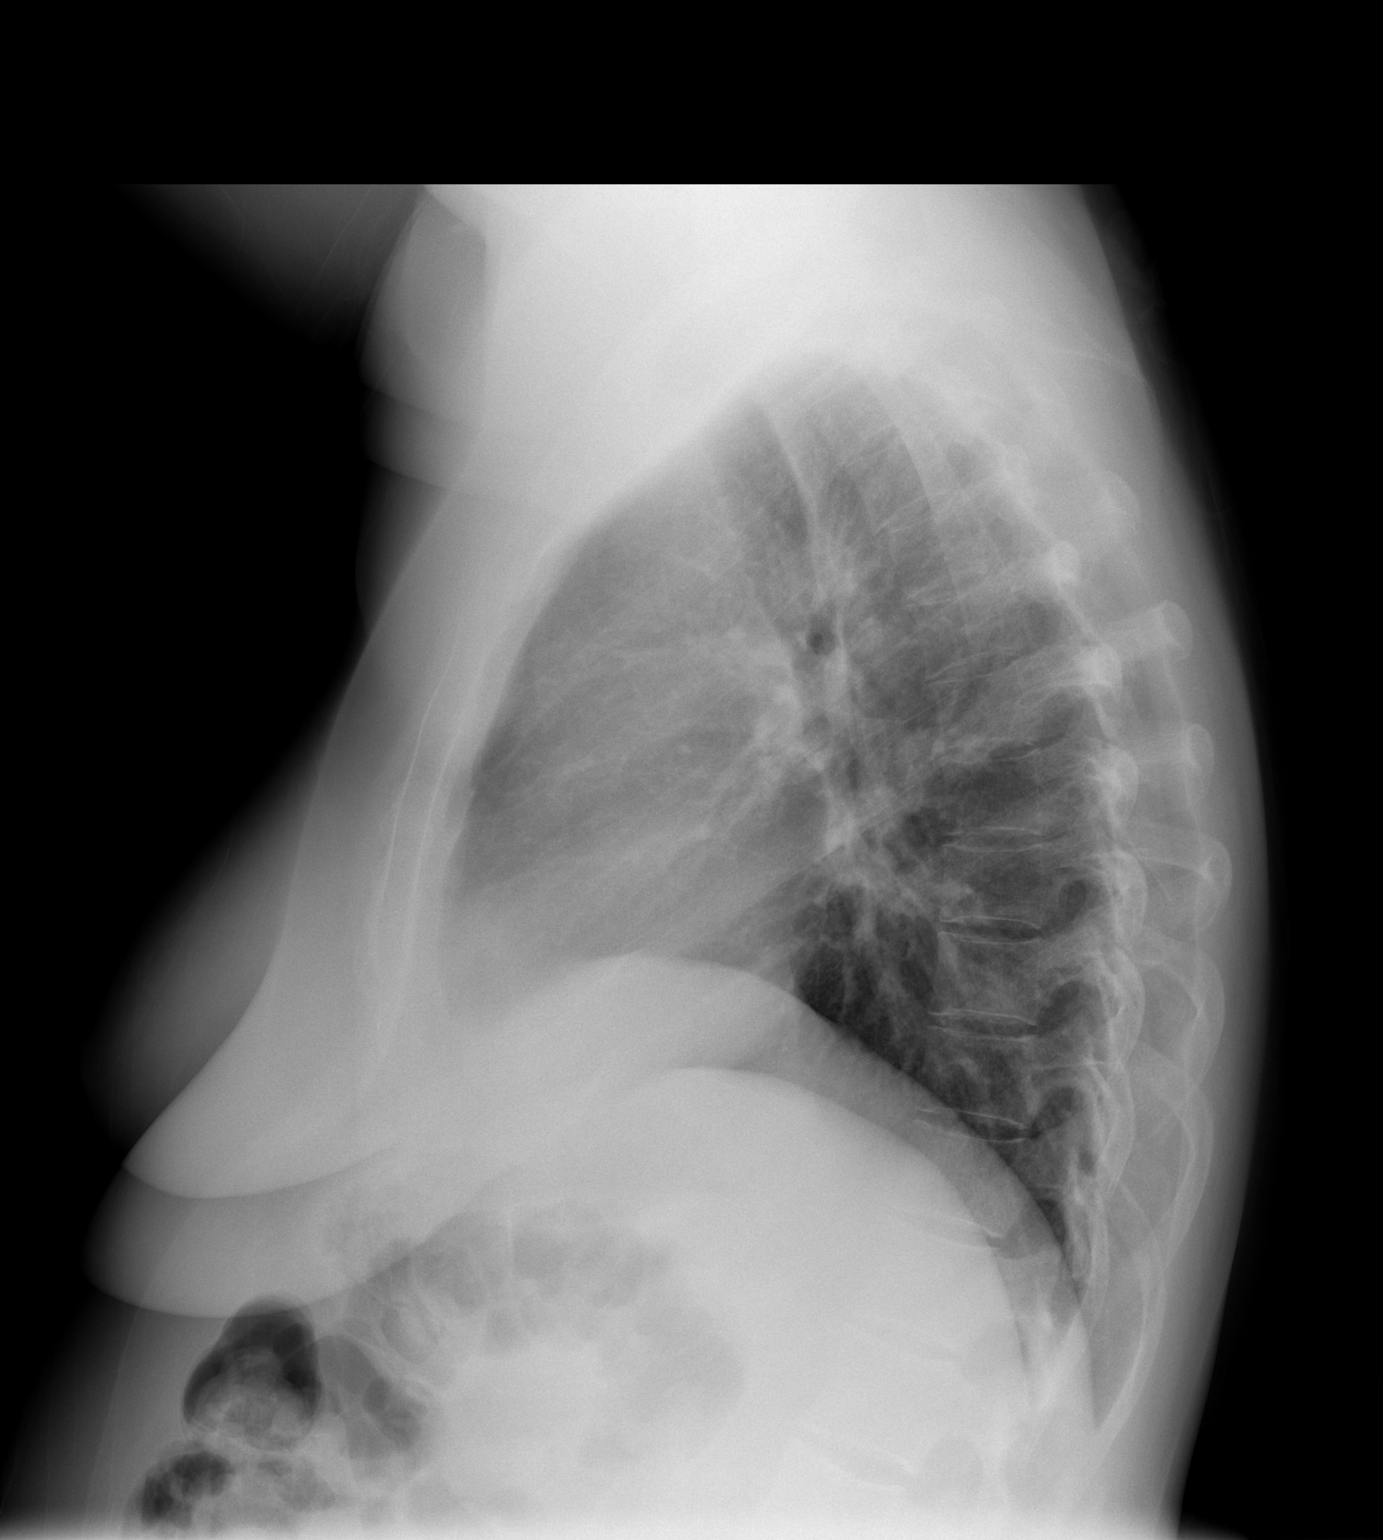

[2 of 2 positions shown; findings below may reference images not displayed]

FINDINGS: The cardiac silhouette, mediastinal and hilar contours
are within normal limits and stable. The lungs are clear.  No
pleural effusions.  The bony thorax is intact.  Remote healed right
rib fractures noted.
IMPRESSION: Normal chest x-ray.

## 2014-04-01 IMAGING — CR DG NECK SOFT TISSUE
1 series · 1 of 1 positions shown · non-contrast
Comparison: 07/27/2012.

CLINICAL DATA: Upper respiratory infection.

NECK SOFT TISSUES - 1+ VIEW

[w soft tissue neck]
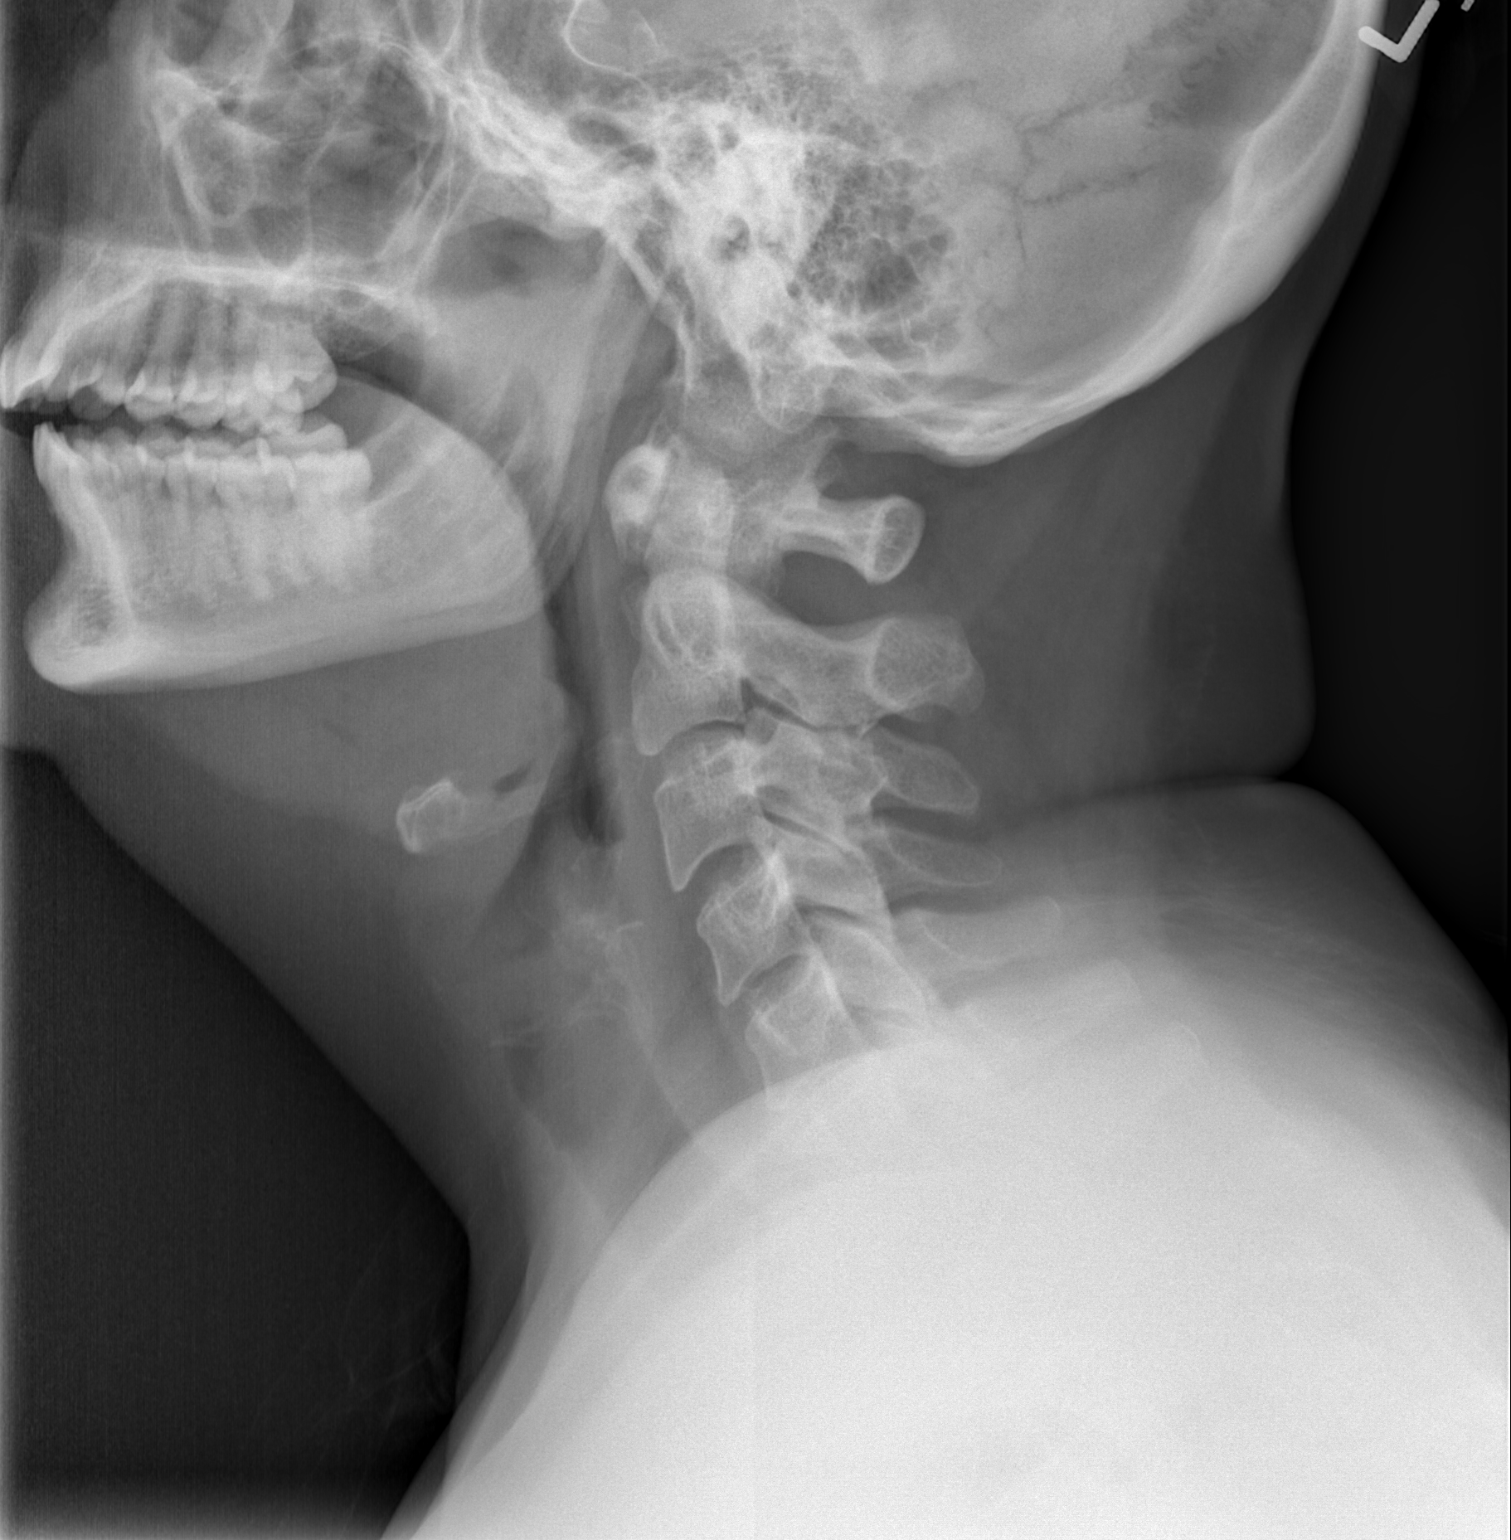

[1 of 1 positions shown; findings below may reference images not displayed]

FINDINGS: The epiglottis and aryepiglottic folds are normal.  No
hypopharyngeal distention or subglottic tracheal narrowing.  No
abnormal prevertebral or retropharyngeal soft tissue swelling or
abnormal air collections.  The cervical vertebral bodies are
normally aligned.
IMPRESSION: Unremarkable lateral soft tissue view of the neck.

## 2014-04-02 ENCOUNTER — Ambulatory Visit (INDEPENDENT_AMBULATORY_CARE_PROVIDER_SITE_OTHER): Payer: Medicare Other | Admitting: Family Medicine

## 2014-04-02 DIAGNOSIS — M79609 Pain in unspecified limb: Secondary | ICD-10-CM | POA: Diagnosis not present

## 2014-04-02 DIAGNOSIS — D649 Anemia, unspecified: Secondary | ICD-10-CM | POA: Diagnosis not present

## 2014-04-02 DIAGNOSIS — M79646 Pain in unspecified finger(s): Secondary | ICD-10-CM

## 2014-04-02 NOTE — Progress Notes (Signed)
   Subjective:    Patient ID: Jennifer [name], female    DOB: April 18, 1966, 48 y.o.   MRN: [id number]  HPI Jennifer [name] presents today for left thumb splinting due to joint calcification accompanied by tenderness and discomfort. Her thumb was splinted without complication. Jennifer [name], CMA    Review of Systems     Objective:   Physical Exam        Assessment & Plan:

## 2014-04-03 DIAGNOSIS — J45909 Unspecified asthma, uncomplicated: Secondary | ICD-10-CM | POA: Diagnosis not present

## 2014-04-05 DIAGNOSIS — R1011 Right upper quadrant pain: Secondary | ICD-10-CM | POA: Diagnosis not present

## 2014-04-05 DIAGNOSIS — R109 Unspecified abdominal pain: Secondary | ICD-10-CM | POA: Diagnosis not present

## 2014-04-05 DIAGNOSIS — K8020 Calculus of gallbladder without cholecystitis without obstruction: Secondary | ICD-10-CM | POA: Diagnosis not present

## 2014-04-05 DIAGNOSIS — M546 Pain in thoracic spine: Secondary | ICD-10-CM | POA: Diagnosis not present

## 2014-04-05 DIAGNOSIS — R1013 Epigastric pain: Secondary | ICD-10-CM | POA: Diagnosis not present

## 2014-04-06 DIAGNOSIS — K8020 Calculus of gallbladder without cholecystitis without obstruction: Secondary | ICD-10-CM | POA: Diagnosis not present

## 2014-04-07 DIAGNOSIS — N816 Rectocele: Secondary | ICD-10-CM | POA: Diagnosis not present

## 2014-04-08 ENCOUNTER — Emergency Department (HOSPITAL_BASED_OUTPATIENT_CLINIC_OR_DEPARTMENT_OTHER)
Admission: EM | Admit: 2014-04-08 | Discharge: 2014-04-08 | Disposition: A | Discharge status: Home / Self Care | Payer: Medicare Other | Attending: Emergency Medicine | Admitting: Emergency Medicine

## 2014-04-08 ENCOUNTER — Encounter (HOSPITAL_BASED_OUTPATIENT_CLINIC_OR_DEPARTMENT_OTHER): Payer: Self-pay | Admitting: Emergency Medicine

## 2014-04-08 ENCOUNTER — Emergency Department (HOSPITAL_BASED_OUTPATIENT_CLINIC_OR_DEPARTMENT_OTHER): Payer: Medicare Other

## 2014-04-08 DIAGNOSIS — Z8742 Personal history of other diseases of the female genital tract: Secondary | ICD-10-CM | POA: Insufficient documentation

## 2014-04-08 DIAGNOSIS — G4730 Sleep apnea, unspecified: Secondary | ICD-10-CM | POA: Insufficient documentation

## 2014-04-08 DIAGNOSIS — R300 Dysuria: Secondary | ICD-10-CM | POA: Insufficient documentation

## 2014-04-08 DIAGNOSIS — Z9981 Dependence on supplemental oxygen: Secondary | ICD-10-CM | POA: Insufficient documentation

## 2014-04-08 DIAGNOSIS — Z79899 Other long term (current) drug therapy: Secondary | ICD-10-CM | POA: Diagnosis not present

## 2014-04-08 DIAGNOSIS — Z9089 Acquired absence of other organs: Secondary | ICD-10-CM | POA: Insufficient documentation

## 2014-04-08 DIAGNOSIS — Z862 Personal history of diseases of the blood and blood-forming organs and certain disorders involving the immune mechanism: Secondary | ICD-10-CM | POA: Insufficient documentation

## 2014-04-08 DIAGNOSIS — Z87891 Personal history of nicotine dependence: Secondary | ICD-10-CM | POA: Insufficient documentation

## 2014-04-08 DIAGNOSIS — R1011 Right upper quadrant pain: Secondary | ICD-10-CM | POA: Diagnosis not present

## 2014-04-08 DIAGNOSIS — Z880 Allergy status to penicillin: Secondary | ICD-10-CM | POA: Diagnosis not present

## 2014-04-08 DIAGNOSIS — Z8669 Personal history of other diseases of the nervous system and sense organs: Secondary | ICD-10-CM | POA: Insufficient documentation

## 2014-04-08 DIAGNOSIS — I10 Essential (primary) hypertension: Secondary | ICD-10-CM | POA: Diagnosis not present

## 2014-04-08 DIAGNOSIS — Z8614 Personal history of Methicillin resistant Staphylococcus aureus infection: Secondary | ICD-10-CM | POA: Diagnosis not present

## 2014-04-08 DIAGNOSIS — Z8542 Personal history of malignant neoplasm of other parts of uterus: Secondary | ICD-10-CM | POA: Diagnosis not present

## 2014-04-08 DIAGNOSIS — R109 Unspecified abdominal pain: Secondary | ICD-10-CM

## 2014-04-08 DIAGNOSIS — Z8639 Personal history of other endocrine, nutritional and metabolic disease: Secondary | ICD-10-CM | POA: Diagnosis not present

## 2014-04-08 DIAGNOSIS — G8929 Other chronic pain: Secondary | ICD-10-CM | POA: Diagnosis not present

## 2014-04-08 DIAGNOSIS — Z9079 Acquired absence of other genital organ(s): Secondary | ICD-10-CM | POA: Diagnosis not present

## 2014-04-08 DIAGNOSIS — J45909 Unspecified asthma, uncomplicated: Secondary | ICD-10-CM | POA: Diagnosis not present

## 2014-04-08 DIAGNOSIS — Z791 Long term (current) use of non-steroidal anti-inflammatories (NSAID): Secondary | ICD-10-CM | POA: Insufficient documentation

## 2014-04-08 DIAGNOSIS — M549 Dorsalgia, unspecified: Secondary | ICD-10-CM | POA: Diagnosis not present

## 2014-04-08 DIAGNOSIS — K589 Irritable bowel syndrome without diarrhea: Secondary | ICD-10-CM | POA: Insufficient documentation

## 2014-04-08 DIAGNOSIS — K219 Gastro-esophageal reflux disease without esophagitis: Secondary | ICD-10-CM | POA: Insufficient documentation

## 2014-04-08 DIAGNOSIS — Z9889 Other specified postprocedural states: Secondary | ICD-10-CM | POA: Diagnosis not present

## 2014-04-08 DIAGNOSIS — Z8659 Personal history of other mental and behavioral disorders: Secondary | ICD-10-CM | POA: Diagnosis not present

## 2014-04-08 DIAGNOSIS — K8020 Calculus of gallbladder without cholecystitis without obstruction: Secondary | ICD-10-CM | POA: Diagnosis not present

## 2014-04-08 DIAGNOSIS — Z9851 Tubal ligation status: Secondary | ICD-10-CM | POA: Insufficient documentation

## 2014-04-08 LAB — URINALYSIS, ROUTINE W REFLEX MICROSCOPIC
Bilirubin Urine: NEGATIVE
Glucose, UA: NEGATIVE mg/dL
Hgb urine dipstick: NEGATIVE
Ketones, ur: NEGATIVE mg/dL
Leukocytes, UA: NEGATIVE
Nitrite: NEGATIVE
Protein, ur: NEGATIVE mg/dL
Specific Gravity, Urine: 1.021 (ref 1.005–1.030)
Urobilinogen, UA: 0.2 mg/dL (ref 0.0–1.0)
pH: 5.5 (ref 5.0–8.0)

## 2014-04-08 LAB — COMPREHENSIVE METABOLIC PANEL
ALT: 24 U/L (ref 0–35)
AST: 18 U/L (ref 0–37)
Albumin: 3.7 g/dL (ref 3.5–5.2)
Alkaline Phosphatase: 93 U/L (ref 39–117)
Anion gap: 13 (ref 5–15)
BUN: 10 mg/dL (ref 6–23)
CO2: 26 mEq/L (ref 19–32)
Calcium: 9.7 mg/dL (ref 8.4–10.5)
Chloride: 104 mEq/L (ref 96–112)
Creatinine, Ser: 0.5 mg/dL (ref 0.50–1.10)
GFR calc Af Amer: >90 mL/min (ref >90)
GFR calc non Af Amer: >90 mL/min (ref >90)
Glucose, Bld: 104 mg/dL — ABNORMAL HIGH (ref 70–99)
Potassium: 3.8 mEq/L (ref 3.7–5.3)
Sodium: 143 mEq/L (ref 137–147)
Total Bilirubin: <0.2 mg/dL — ABNORMAL LOW (ref 0.3–1.2)
Total Protein: 7.4 g/dL (ref 6.0–8.3)

## 2014-04-08 LAB — CBC WITH DIFFERENTIAL/PLATELET
Basophils Absolute: 0 10*3/uL (ref 0.0–0.1)
Basophils Relative: 1 % (ref 0–1)
Eosinophils Absolute: 0.2 10*3/uL (ref 0.0–0.7)
Eosinophils Relative: 3 % (ref 0–5)
HCT: 40.2 % (ref 36.0–46.0)
Hemoglobin: 13.2 g/dL (ref 12.0–15.0)
Lymphocytes Relative: 20 % (ref 12–46)
Lymphs Abs: 1.3 10*3/uL (ref 0.7–4.0)
MCH: 29.3 pg (ref 26.0–34.0)
MCHC: 32.8 g/dL (ref 30.0–36.0)
MCV: 89.3 fL (ref 78.0–100.0)
Monocytes Absolute: 0.6 10*3/uL (ref 0.1–1.0)
Monocytes Relative: 9 % (ref 3–12)
Neutro Abs: 4.4 10*3/uL (ref 1.7–7.7)
Neutrophils Relative %: 67 % (ref 43–77)
Platelets: 217 10*3/uL (ref 150–400)
RBC: 4.5 MIL/uL (ref 3.87–5.11)
RDW: 14.1 % (ref 11.5–15.5)
WBC: 6.4 10*3/uL (ref 4.0–10.5)

## 2014-04-08 LAB — LIPASE, BLOOD: Lipase: 48 U/L (ref 11–59)

## 2014-04-08 MED ORDER — ONDANSETRON 4 MG PO TBDP
4.0000 mg | ORAL_TABLET | Freq: Once | ORAL | Status: AC
  Administered 2014-04-08: 4 mg via ORAL
  Filled 2014-04-08: qty 1

## 2014-04-08 MED ORDER — IBUPROFEN 400 MG PO TABS
400.0000 mg | ORAL_TABLET | Freq: Once | ORAL | Status: AC
  Administered 2014-04-08: 400 mg via ORAL
  Filled 2014-04-08: qty 1
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
## 2014-04-08 NOTE — ED Notes (Signed)
Pt c/o lower back pain radiating around to her right abd with nausea since last week. Pt sts some pain at the end of urination.

## 2014-04-08 NOTE — Discharge Instructions (Signed)
Abdominal Pain, Women °Abdominal (stomach, pelvic, or belly) pain can be caused by many things. It is important to tell your doctor: °· The location of the pain. °· Does it come and go or is it present all the time? °· Are there things that start the pain (eating certain foods, exercise)? °· Are there other symptoms associated with the pain (fever, nausea, vomiting, diarrhea)? °All of this is helpful to know when trying to find the cause of the pain. °CAUSES  °· Stomach: virus or bacteria infection, or ulcer. °· Intestine: appendicitis (inflamed appendix), regional ileitis (Crohn's disease), ulcerative colitis (inflamed colon), irritable bowel syndrome, diverticulitis (inflamed diverticulum of the colon), or cancer of the stomach or intestine. °· Gallbladder disease or stones in the gallbladder. °· Kidney disease, kidney stones, or infection. °· Pancreas infection or cancer. °· Fibromyalgia (pain disorder). °· Diseases of the female organs: °¨ Uterus: fibroid (non-cancerous) tumors or infection. °¨ Fallopian tubes: infection or tubal pregnancy. °¨ Ovary: cysts or tumors. °¨ Pelvic adhesions (scar tissue). °¨ Endometriosis (uterus lining tissue growing in the pelvis and on the pelvic organs). °¨ Pelvic congestion syndrome (female organs filling up with blood just before the menstrual period). °¨ Pain with the menstrual period. °¨ Pain with ovulation (producing an egg). °¨ Pain with an IUD (intrauterine device, birth control) in the uterus. °¨ Cancer of the female organs. °· Functional pain (pain not caused by a disease, may improve without treatment). °· Psychological pain. °· Depression. °DIAGNOSIS  °Your doctor will decide the seriousness of your pain by doing an examination. °· Blood tests. °· X-rays. °· Ultrasound. °· CT scan (computed tomography, special type of X-ray). °· MRI (magnetic resonance imaging). °· Cultures, for infection. °· Barium enema (dye inserted in the large intestine, to better view it with  X-rays). °· Colonoscopy (looking in intestine with a lighted tube). °· Laparoscopy (minor surgery, looking in abdomen with a lighted tube). °· Major abdominal exploratory surgery (looking in abdomen with a large incision). °TREATMENT  °The treatment will depend on the cause of the pain.  °· Many cases can be observed and treated at home. °· Over-the-counter medicines recommended by your caregiver. °· Prescription medicine. °· Antibiotics, for infection. °· Birth control pills, for painful periods or for ovulation pain. °· Hormone treatment, for endometriosis. °· Nerve blocking injections. °· Physical therapy. °· Antidepressants. °· Counseling with a psychologist or psychiatrist. °· Minor or major surgery. °HOME CARE INSTRUCTIONS  °· Do not take laxatives, unless directed by your caregiver. °· Take over-the-counter pain medicine only if ordered by your caregiver. Do not take aspirin because it can cause an upset stomach or bleeding. °· Try a clear liquid diet (broth or water) as ordered by your caregiver. Slowly move to a bland diet, as tolerated, if the pain is related to the stomach or intestine. °· Have a thermometer and take your temperature several times a day, and record it. °· Bed rest and sleep, if it helps the pain. °· Avoid sexual intercourse, if it causes pain. °· Avoid stressful situations. °· Keep your follow-up appointments and tests, as your caregiver orders. °· If the pain does not go away with medicine or surgery, you may try: °¨ Acupuncture. °¨ Relaxation exercises (yoga, meditation). °¨ Group therapy. °¨ Counseling. °SEEK MEDICAL CARE IF:  °· You notice certain foods cause stomach pain. °· Your home care treatment is not helping your pain. °· You need stronger pain medicine. °· You want your IUD removed. °· You feel faint or   lightheaded. °· You develop nausea and vomiting. °· You develop a rash. °· You are having side effects or an allergy to your medicine. °SEEK IMMEDIATE MEDICAL CARE IF:  °· Your  pain does not go away or gets worse. °· You have a fever. °· Your pain is felt only in portions of the abdomen. The right side could possibly be appendicitis. The left lower portion of the abdomen could be colitis or diverticulitis. °· You are passing blood in your stools (bright red or black tarry stools, with or without vomiting). °· You have blood in your urine. °· You develop chills, with or without a fever. °· You pass out. °MAKE SURE YOU:  °· Understand these instructions. °· Will watch your condition. °· Will get help right away if you are not doing well or get worse. °Document Released: 05/28/2007 Document Revised: 12/15/2013 Document Reviewed: 06/17/2009 °[employer]® Patient Information ©2015 [employer]. This information is not intended to replace advice given to you by your health care provider. Make sure you discuss any questions you have with your health care provider. ° °Flank Pain °Flank pain is pain in your side. The flank is the area of your side between your upper belly (abdomen) and your back. Pain in this area can be caused by many different things. °HOME CARE °Home care and treatment will depend on the cause of your pain. °· Rest as told by your doctor. °· Drink enough fluids to keep your pee (urine) clear or pale yellow.   °· Only take medicine as told by your doctor. °· Tell your doctor about any changes in your pain. °· Follow up with your doctor. °GET HELP RIGHT AWAY IF:  °· Your pain does not get better with medicine.   °· You have new symptoms or your symptoms get worse. °· Your pain gets worse.   °· You have belly (abdominal) pain.   °· You are short of breath.   °· You always feel sick to your stomach (nauseous).   °· You keep throwing up (vomiting).   °· You have puffiness (swelling) in your belly.   °· You feel light-headed or you pass out (faint).   °· You have blood in your pee. °· You have a fever or lasting symptoms for more than 2-3 days. °· You have a fever and your symptoms  suddenly get worse. °MAKE SURE YOU:  °· Understand these instructions. °· Will watch your condition. °· Will get help right away if you are not doing well or get worse. °Document Released: 05/09/2008 Document Revised: 12/15/2013 Document Reviewed: 03/14/2012 °Exit[employer]® Patient Information ©2015 [employer]. This information is not intended to replace advice given to you by your health care provider. Make sure you discuss any questions you have with your health care provider. ° °

## 2014-04-09 ENCOUNTER — Encounter: Payer: Self-pay | Admitting: Family Medicine

## 2014-04-10 ENCOUNTER — Ambulatory Visit (INDEPENDENT_AMBULATORY_CARE_PROVIDER_SITE_OTHER): Payer: Medicare Other | Admitting: Physician Assistant

## 2014-04-10 ENCOUNTER — Ambulatory Visit (INDEPENDENT_AMBULATORY_CARE_PROVIDER_SITE_OTHER): Payer: Medicare Other

## 2014-04-10 ENCOUNTER — Encounter: Payer: Self-pay | Admitting: Physician Assistant

## 2014-04-10 VITALS — BP 136/87 | HR 82 | Ht 62.0 in | Wt 186.0 lb

## 2014-04-10 DIAGNOSIS — Z91018 Allergy to other foods: Secondary | ICD-10-CM | POA: Diagnosis not present

## 2014-04-10 DIAGNOSIS — R143 Flatulence: Secondary | ICD-10-CM | POA: Diagnosis not present

## 2014-04-10 DIAGNOSIS — N644 Mastodynia: Secondary | ICD-10-CM | POA: Diagnosis not present

## 2014-04-10 DIAGNOSIS — Z79899 Other long term (current) drug therapy: Secondary | ICD-10-CM | POA: Diagnosis not present

## 2014-04-10 DIAGNOSIS — R109 Unspecified abdominal pain: Secondary | ICD-10-CM

## 2014-04-10 DIAGNOSIS — Z881 Allergy status to other antibiotic agents status: Secondary | ICD-10-CM | POA: Diagnosis not present

## 2014-04-10 DIAGNOSIS — R071 Chest pain on breathing: Secondary | ICD-10-CM | POA: Diagnosis not present

## 2014-04-10 DIAGNOSIS — Z888 Allergy status to other drugs, medicaments and biological substances status: Secondary | ICD-10-CM | POA: Diagnosis not present

## 2014-04-10 DIAGNOSIS — R1011 Right upper quadrant pain: Secondary | ICD-10-CM

## 2014-04-10 DIAGNOSIS — K5900 Constipation, unspecified: Secondary | ICD-10-CM | POA: Diagnosis not present

## 2014-04-10 DIAGNOSIS — R079 Chest pain, unspecified: Secondary | ICD-10-CM | POA: Diagnosis not present

## 2014-04-10 DIAGNOSIS — R141 Gas pain: Secondary | ICD-10-CM | POA: Diagnosis not present

## 2014-04-10 DIAGNOSIS — IMO0001 Reserved for inherently not codable concepts without codable children: Secondary | ICD-10-CM

## 2014-04-10 DIAGNOSIS — Z885 Allergy status to narcotic agent status: Secondary | ICD-10-CM | POA: Diagnosis not present

## 2014-04-10 DIAGNOSIS — Z87891 Personal history of nicotine dependence: Secondary | ICD-10-CM | POA: Diagnosis not present

## 2014-04-10 DIAGNOSIS — Z880 Allergy status to penicillin: Secondary | ICD-10-CM | POA: Diagnosis not present

## 2014-04-10 DIAGNOSIS — K219 Gastro-esophageal reflux disease without esophagitis: Secondary | ICD-10-CM | POA: Diagnosis not present

## 2014-04-10 DIAGNOSIS — R142 Eructation: Secondary | ICD-10-CM

## 2014-04-10 DIAGNOSIS — Z882 Allergy status to sulfonamides status: Secondary | ICD-10-CM | POA: Diagnosis not present

## 2014-04-10 DIAGNOSIS — M549 Dorsalgia, unspecified: Secondary | ICD-10-CM | POA: Diagnosis not present

## 2014-04-10 LAB — POCT URINALYSIS DIPSTICK
Bilirubin, UA: NEGATIVE
Blood, UA: NEGATIVE
Glucose, UA: NEGATIVE
Ketones, UA: NEGATIVE
Leukocytes, UA: NEGATIVE
Nitrite, UA: NEGATIVE
Protein, UA: NEGATIVE
Spec Grav, UA: 1.015
Urobilinogen, UA: 0.2
pH, UA: 7.5

## 2014-04-10 MED ORDER — LUBIPROSTONE 24 MCG PO CAPS: 24.0000 ug | ORAL_CAPSULE | Freq: Two times a day (BID) | ORAL | Status: DC

## 2014-04-10 NOTE — Progress Notes (Signed)
   Subjective:    Patient ID: Jennifer [name], female    DOB: 10-Feb-1966, 48 y.o.   MRN: [id number]  HPI Pt presents to the clinic for persistent RUQ and bilateral flank pain. No real urinary symptoms. No fever, chills, n/v/d. She is not having regular bowel movements and there are hard. She admits to a lot of gas. She has been seen 8/26 and 8/28 for same condition. Nothing has been found and per pt nothing done.    Review of Systems  All other systems reviewed and are negative.      Objective:   Physical Exam  Constitutional: She is oriented to person, place, and time. She appears well-developed and well-nourished.  HENT:  Head: Normocephalic and atraumatic.  Cardiovascular: Normal rate, regular rhythm and normal heart sounds.   Pulmonary/Chest: Effort normal and breath sounds normal.  Abdominal: She exhibits no mass.  Generalized abdominal tenderness with worsening discomfort over RUQ. No rebound or guarding.   Neurological: She is alert and oriented to person, place, and time.  Skin: Skin is dry.  Psychiatric: She has a normal mood and affect. Her behavior is normal.          Assessment & Plan:  RUQ pain/abdominal bloating/gas/constipation- .Marland Kitchen Results for orders placed in visit on 04/10/14  POCT URINALYSIS DIPSTICK      Result Value Ref Range   Color, UA yellow     Clarity, UA slightly cloudy     Glucose, UA neg     Bilirubin, UA neg     Ketones, UA neg     Spec Grav, UA 1.015     Blood, UA neg     pH, UA 7.5     Protein, UA neg     Urobilinogen, UA 0.2     Nitrite, UA neg     Leukocytes, UA Negative        *Note: Due to a large number of results for the requested time period, some results have not been displayed. A complete set of results can be found in Results Review.   at this point pt has been evaluated multiple times for same condition. I do not think there is any acute process going on. Pt has hx of gallstones but non-obstructing. Discussed follow up with  surgeon to discuss possibility of removal. Recently had abdominal ultrasound which was negative. Today did stat abd xray.( no acute process/gas found with some stool burdern) hx of constipation. Gave 13mL of mintox in office today. Gave a few day samples of Amitiza bid to try. Pt is always very anxious about starting new medications. Discussed possible side effects. Increase water and fiber.   Spent 30 minutes with patient and greater than 50 percent of visit spent counseling pt regarding treatment plan.

## 2014-04-10 NOTE — Patient Instructions (Addendum)
mintox given in office today.  amitiza samples given.  Keep hydrated.  Consider cholecystitis.   Lubiprostone oral capsule What is this medicine? LUBIPROSTONE (loo bi PROS tone) is a laxative. It is used to treat chronic constipation and constipation caused by opioids (certain prescription pain medicines). It is also used to treat adult women with irritable bowel syndrome who have constipation. This medicine may be used for other purposes; ask your health care provider or pharmacist if you have questions. COMMON BRAND NAME(S): Amitiza What should I tell my health care provider before I take this medicine? They need to know if you have any of these conditions: -cancer or tumor in abdomen, intestine, or stomach -history of bowel obstruction or adhesions -history of stool (fecal) impaction -liver disease -an unusual or allergic reaction to lubiprostone, other medicines, foods, dyes, or preservatives -pregnant or trying to get pregnant -breast-feeding How should I use this medicine? Take this medicine by mouth with a glass of water. Follow the directions on the prescription label. Do not cut, crush or chew this medicine. Take this medicine with food. Take your medicine at regular intervals. Do not take your medicine more often than directed. Do not stop taking except on your doctor's advice. Talk to your pediatrician regarding the use of this medicine in children. Special care may be needed. Overdosage: If you think you have taken too much of this medicine contact a poison control center or emergency room at once. NOTE: This medicine is only for you. Do not share this medicine with others. What if I miss a dose? If you miss a dose, take it as soon as you can. If it is almost time for your next dose, take only that dose. Do not take double or extra doses. What may interact with this medicine? -medicines that treat diarrhea -methadone -other medicines for constipation This list may not  describe all possible interactions. Give your health care provider a list of all the medicines, herbs, non-prescription drugs, or dietary supplements you use. Also tell them if you smoke, drink alcohol, or use illegal drugs. Some items may interact with your medicine. What should I watch for while using this medicine? Visit your doctor for regular check ups. Tell your doctor if your symptoms do not get better or if they get worse. What side effects may I notice from receiving this medicine? Side effects that you should report to your doctor or health care professional as soon as possible: -allergic reactions like skin rash, itching or hives, swelling of the face, lips, or tongue -new or worsening stomach pain -severe or prolonged diarrhea Side effects that usually do not require medical attention (report to your prescriber or health care professional if they continue or are bothersome): -headache -loose stools -nausea This list may not describe all possible side effects. Call your doctor for medical advice about side effects. You may report side effects to FDA at 1-800-FDA-1088. Where should I keep my medicine? Keep out of the reach of children. Store at room temperature between 15 and 30 degrees C (59 and 86 degrees F). Throw away any unused medicine after the expiration date. NOTE: This sheet is a summary. It may not cover all possible information. If you have questions about this medicine, talk to your doctor, pharmacist, or health care provider.  2015, Elsevier/Gold Standard. (2011-12-08 09:24:31)

## 2014-04-12 DIAGNOSIS — I10 Essential (primary) hypertension: Secondary | ICD-10-CM | POA: Diagnosis not present

## 2014-04-12 DIAGNOSIS — G35 Multiple sclerosis: Secondary | ICD-10-CM | POA: Diagnosis not present

## 2014-04-12 DIAGNOSIS — R6884 Jaw pain: Secondary | ICD-10-CM | POA: Diagnosis not present

## 2014-04-12 DIAGNOSIS — E669 Obesity, unspecified: Secondary | ICD-10-CM | POA: Diagnosis not present

## 2014-04-12 DIAGNOSIS — R1084 Generalized abdominal pain: Secondary | ICD-10-CM | POA: Diagnosis not present

## 2014-04-12 DIAGNOSIS — Z8673 Personal history of transient ischemic attack (TIA), and cerebral infarction without residual deficits: Secondary | ICD-10-CM | POA: Diagnosis not present

## 2014-04-12 DIAGNOSIS — R109 Unspecified abdominal pain: Secondary | ICD-10-CM | POA: Diagnosis not present

## 2014-04-12 DIAGNOSIS — G8929 Other chronic pain: Secondary | ICD-10-CM | POA: Diagnosis not present

## 2014-04-13 ENCOUNTER — Telehealth: Payer: Self-pay | Admitting: Physician Assistant

## 2014-04-13 DIAGNOSIS — R109 Unspecified abdominal pain: Secondary | ICD-10-CM | POA: Diagnosis not present

## 2014-04-13 DIAGNOSIS — K5909 Other constipation: Secondary | ICD-10-CM | POA: Insufficient documentation

## 2014-04-13 DIAGNOSIS — R1011 Right upper quadrant pain: Secondary | ICD-10-CM | POA: Insufficient documentation

## 2014-04-13 NOTE — Telephone Encounter (Signed)
Pt requested information on where she was sent to discuss cholecystectomy. i have we sent her to [facility] [phone].

## 2014-04-14 ENCOUNTER — Telehealth: Payer: Self-pay | Admitting: Cardiology

## 2014-04-14 DIAGNOSIS — G35 Multiple sclerosis: Secondary | ICD-10-CM | POA: Diagnosis not present

## 2014-04-14 DIAGNOSIS — Z880 Allergy status to penicillin: Secondary | ICD-10-CM | POA: Diagnosis not present

## 2014-04-14 DIAGNOSIS — I10 Essential (primary) hypertension: Secondary | ICD-10-CM | POA: Diagnosis not present

## 2014-04-14 DIAGNOSIS — R109 Unspecified abdominal pain: Secondary | ICD-10-CM | POA: Diagnosis not present

## 2014-04-14 DIAGNOSIS — Z87891 Personal history of nicotine dependence: Secondary | ICD-10-CM | POA: Diagnosis not present

## 2014-04-14 DIAGNOSIS — E269 Hyperaldosteronism, unspecified: Secondary | ICD-10-CM | POA: Diagnosis not present

## 2014-04-14 DIAGNOSIS — E785 Hyperlipidemia, unspecified: Secondary | ICD-10-CM | POA: Diagnosis not present

## 2014-04-14 DIAGNOSIS — Z886 Allergy status to analgesic agent status: Secondary | ICD-10-CM | POA: Diagnosis not present

## 2014-04-14 DIAGNOSIS — Z8542 Personal history of malignant neoplasm of other parts of uterus: Secondary | ICD-10-CM | POA: Diagnosis not present

## 2014-04-14 DIAGNOSIS — K219 Gastro-esophageal reflux disease without esophagitis: Secondary | ICD-10-CM | POA: Diagnosis not present

## 2014-04-14 DIAGNOSIS — Z91011 Allergy to milk products: Secondary | ICD-10-CM | POA: Diagnosis not present

## 2014-04-14 DIAGNOSIS — R1110 Vomiting, unspecified: Secondary | ICD-10-CM | POA: Diagnosis not present

## 2014-04-14 DIAGNOSIS — G8929 Other chronic pain: Secondary | ICD-10-CM | POA: Diagnosis not present

## 2014-04-14 DIAGNOSIS — R110 Nausea: Secondary | ICD-10-CM | POA: Diagnosis not present

## 2014-04-14 DIAGNOSIS — Z888 Allergy status to other drugs, medicaments and biological substances status: Secondary | ICD-10-CM | POA: Diagnosis not present

## 2014-04-14 DIAGNOSIS — Z79899 Other long term (current) drug therapy: Secondary | ICD-10-CM | POA: Diagnosis not present

## 2014-04-14 DIAGNOSIS — Z883 Allergy status to other anti-infective agents status: Secondary | ICD-10-CM | POA: Diagnosis not present

## 2014-04-14 DIAGNOSIS — Z882 Allergy status to sulfonamides status: Secondary | ICD-10-CM | POA: Diagnosis not present

## 2014-04-14 DIAGNOSIS — Z91018 Allergy to other foods: Secondary | ICD-10-CM | POA: Diagnosis not present

## 2014-04-14 NOTE — Telephone Encounter (Signed)
Pt is aware.  

## 2014-04-14 NOTE — Telephone Encounter (Signed)
New message      Pt said she is due to have surgery tomorrow and the clearance has not been sent.  She is very upset---do you know anything about this?  She is so mad she refused to give me more details as to the surgeons name and type of surgery.  She said we should have taken care of this months ago.

## 2014-04-14 NOTE — Telephone Encounter (Signed)
Forward to [employer]. As [employer]

## 2014-04-14 NOTE — Telephone Encounter (Signed)
Spoke with Dr Jenel Lucks office. They stated that pt was not yet scheduled for surgery. I explained we had faxed over a clearance in April. I got the new Fax number [phone] and phone [phone] and sent over for pt. Pt states she will be seeing them tomorrow and she will be getting her Gallbladder out. She also stated that she felt the heart monitor was not correct and that she could not get it up to her heart fast enough when she felt the "flip flopping" of her heart. She said after those occurs she felt tired. To Dr Radford Pax as a FYI.Marland KitchenMarland Kitchen

## 2014-04-14 NOTE — Telephone Encounter (Signed)
Called again to speak to Dr Jenel Lucks Nurse. They received the Clearance and they have her set to have an OV with Dr dasher tomorrow 04/15/14 at 3:00PM.

## 2014-04-15 DIAGNOSIS — G4733 Obstructive sleep apnea (adult) (pediatric): Secondary | ICD-10-CM | POA: Diagnosis not present

## 2014-04-15 DIAGNOSIS — G35 Multiple sclerosis: Secondary | ICD-10-CM | POA: Diagnosis not present

## 2014-04-15 DIAGNOSIS — K8020 Calculus of gallbladder without cholecystitis without obstruction: Secondary | ICD-10-CM | POA: Diagnosis not present

## 2014-04-15 IMAGING — CR DG ABDOMEN 1V
2 series · 2 of 2 positions shown · non-contrast
Comparison: CT abdomen pelvis of 10/23/2012

CLINICAL DATA: Generalized abdominal pain, nausea, constipation

ABDOMEN - 1 VIEW

[view not recorded (1 of 2)]
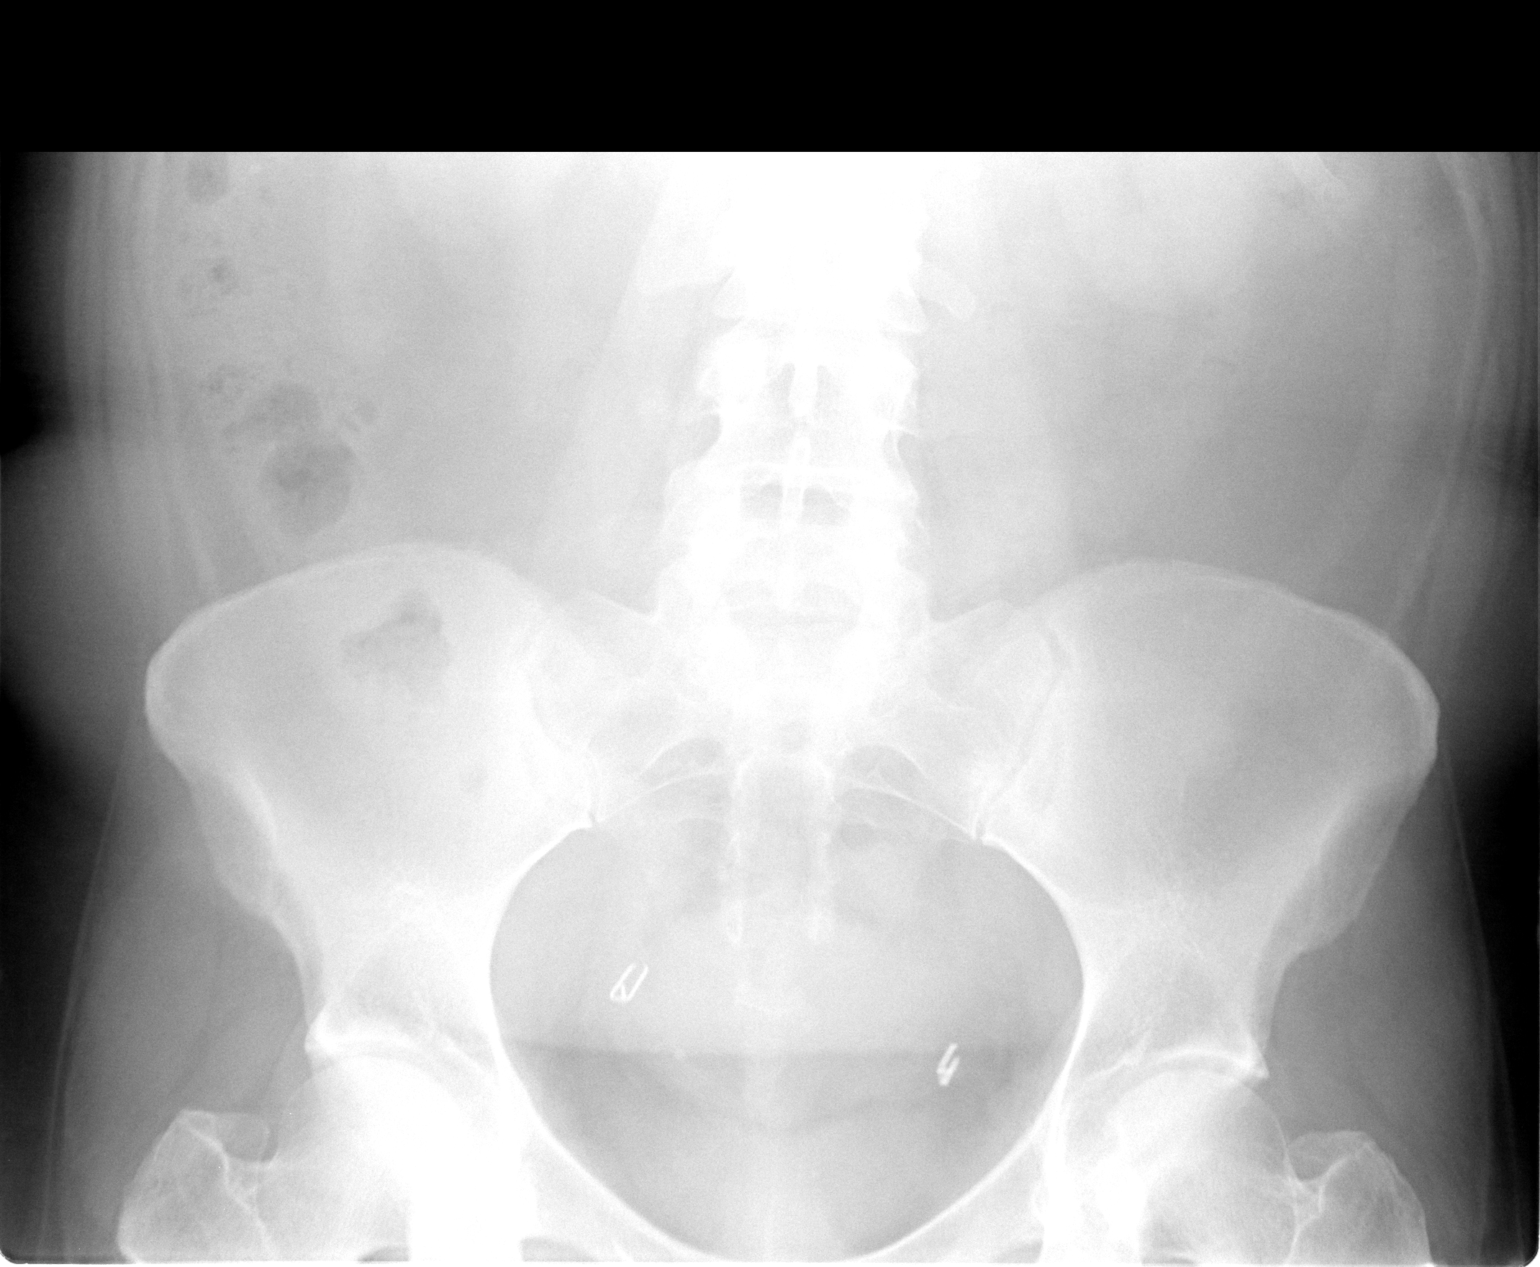

[view not recorded (2 of 2)]
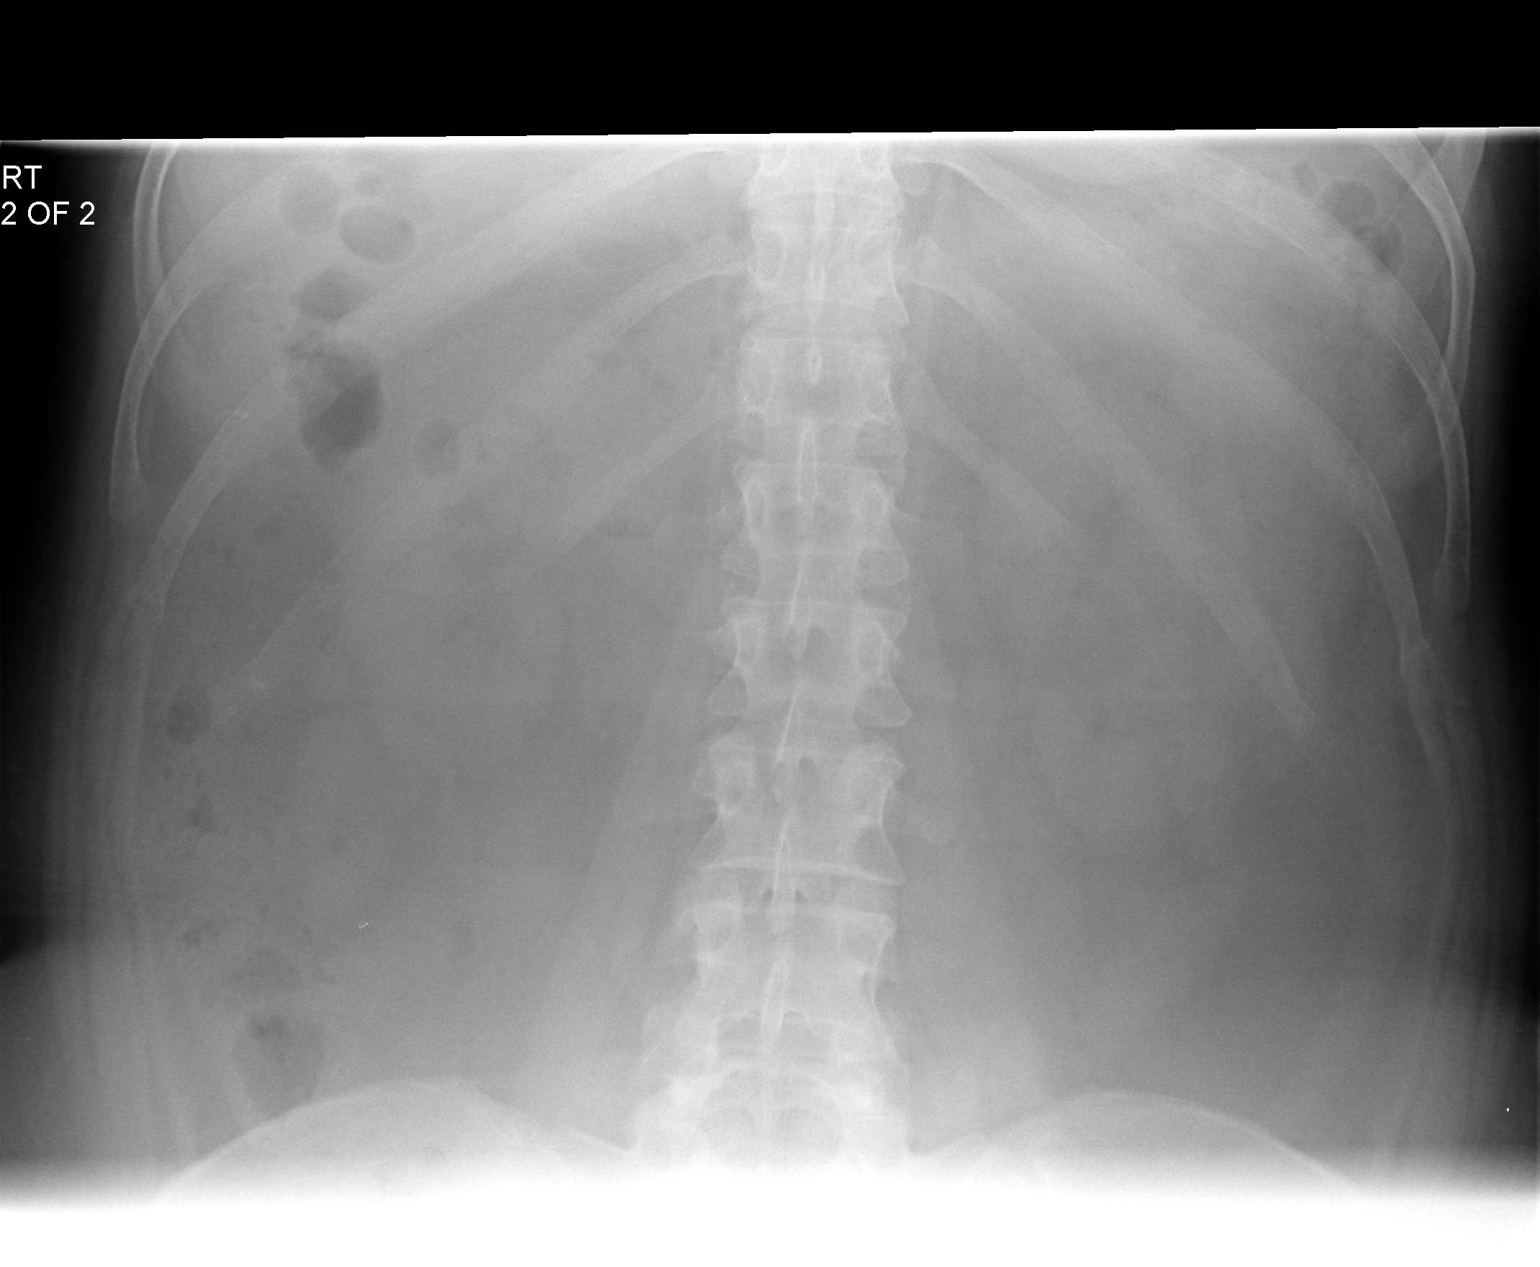

[2 of 2 positions shown; findings below may reference images not displayed]

FINDINGS: There is only minimal degenerative joint disease of the
hips with slight loss of superior joint space and sclerosis with
mild acetabular spurring.  No acute abnormality is seen.  The SI
joints appear corticated with mild degenerative change.  Bilateral
tubal ligation clips are present.  The bowel gas pattern is
nonspecific.
IMPRESSION: Only mild degenerative joint disease of the hips.  No acute
abnormality.

## 2014-04-15 NOTE — Telephone Encounter (Signed)
Please tell her I hope she does well and to call us in a few days adn give [place] an update.

## 2014-04-15 NOTE — Telephone Encounter (Signed)
Pt called and stated that she is going to have her Gallbladder surgery done tomorrow @ [facility]. She wanted to let Dr. Madilyn Fireman know.Jennifer [name]

## 2014-04-15 NOTE — Telephone Encounter (Signed)
Called and lvm for return call.Jennifer [name]

## 2014-04-16 DIAGNOSIS — G35 Multiple sclerosis: Secondary | ICD-10-CM | POA: Diagnosis not present

## 2014-04-16 DIAGNOSIS — I471 Supraventricular tachycardia: Secondary | ICD-10-CM | POA: Diagnosis not present

## 2014-04-16 DIAGNOSIS — K219 Gastro-esophageal reflux disease without esophagitis: Secondary | ICD-10-CM | POA: Diagnosis not present

## 2014-04-16 DIAGNOSIS — Z90710 Acquired absence of both cervix and uterus: Secondary | ICD-10-CM | POA: Diagnosis not present

## 2014-04-16 DIAGNOSIS — Z8542 Personal history of malignant neoplasm of other parts of uterus: Secondary | ICD-10-CM | POA: Diagnosis not present

## 2014-04-16 DIAGNOSIS — Z885 Allergy status to narcotic agent status: Secondary | ICD-10-CM | POA: Diagnosis not present

## 2014-04-16 DIAGNOSIS — G4733 Obstructive sleep apnea (adult) (pediatric): Secondary | ICD-10-CM | POA: Diagnosis not present

## 2014-04-16 DIAGNOSIS — K2270 Barrett's esophagus without dysplasia: Secondary | ICD-10-CM | POA: Diagnosis not present

## 2014-04-16 DIAGNOSIS — Z9089 Acquired absence of other organs: Secondary | ICD-10-CM | POA: Diagnosis not present

## 2014-04-16 DIAGNOSIS — Z79899 Other long term (current) drug therapy: Secondary | ICD-10-CM | POA: Diagnosis not present

## 2014-04-16 DIAGNOSIS — Z91018 Allergy to other foods: Secondary | ICD-10-CM | POA: Diagnosis not present

## 2014-04-16 DIAGNOSIS — J45909 Unspecified asthma, uncomplicated: Secondary | ICD-10-CM | POA: Diagnosis not present

## 2014-04-16 DIAGNOSIS — Z87891 Personal history of nicotine dependence: Secondary | ICD-10-CM | POA: Diagnosis not present

## 2014-04-16 DIAGNOSIS — E785 Hyperlipidemia, unspecified: Secondary | ICD-10-CM | POA: Diagnosis not present

## 2014-04-16 DIAGNOSIS — Z881 Allergy status to other antibiotic agents status: Secondary | ICD-10-CM | POA: Diagnosis not present

## 2014-04-16 DIAGNOSIS — K8010 Calculus of gallbladder with chronic cholecystitis without obstruction: Secondary | ICD-10-CM | POA: Diagnosis not present

## 2014-04-16 DIAGNOSIS — Z886 Allergy status to analgesic agent status: Secondary | ICD-10-CM | POA: Diagnosis not present

## 2014-04-16 DIAGNOSIS — I10 Essential (primary) hypertension: Secondary | ICD-10-CM | POA: Diagnosis not present

## 2014-04-16 DIAGNOSIS — F411 Generalized anxiety disorder: Secondary | ICD-10-CM | POA: Diagnosis not present

## 2014-04-16 DIAGNOSIS — Z880 Allergy status to penicillin: Secondary | ICD-10-CM | POA: Diagnosis not present

## 2014-04-16 DIAGNOSIS — Z9851 Tubal ligation status: Secondary | ICD-10-CM | POA: Diagnosis not present

## 2014-04-16 DIAGNOSIS — Z888 Allergy status to other drugs, medicaments and biological substances status: Secondary | ICD-10-CM | POA: Diagnosis not present

## 2014-04-16 DIAGNOSIS — Z882 Allergy status to sulfonamides status: Secondary | ICD-10-CM | POA: Diagnosis not present

## 2014-04-17 DIAGNOSIS — IMO0002 Reserved for concepts with insufficient information to code with codable children: Secondary | ICD-10-CM | POA: Diagnosis not present

## 2014-04-17 DIAGNOSIS — J988 Other specified respiratory disorders: Secondary | ICD-10-CM | POA: Diagnosis not present

## 2014-04-17 DIAGNOSIS — I10 Essential (primary) hypertension: Secondary | ICD-10-CM | POA: Diagnosis not present

## 2014-04-17 DIAGNOSIS — R0602 Shortness of breath: Secondary | ICD-10-CM | POA: Diagnosis not present

## 2014-04-17 DIAGNOSIS — Z9089 Acquired absence of other organs: Secondary | ICD-10-CM | POA: Diagnosis not present

## 2014-04-17 DIAGNOSIS — K219 Gastro-esophageal reflux disease without esophagitis: Secondary | ICD-10-CM | POA: Diagnosis not present

## 2014-04-17 DIAGNOSIS — K2270 Barrett's esophagus without dysplasia: Secondary | ICD-10-CM | POA: Diagnosis not present

## 2014-04-17 DIAGNOSIS — Z79899 Other long term (current) drug therapy: Secondary | ICD-10-CM | POA: Diagnosis not present

## 2014-04-17 DIAGNOSIS — G35 Multiple sclerosis: Secondary | ICD-10-CM | POA: Diagnosis not present

## 2014-04-17 DIAGNOSIS — K8010 Calculus of gallbladder with chronic cholecystitis without obstruction: Secondary | ICD-10-CM | POA: Diagnosis not present

## 2014-04-17 DIAGNOSIS — J45909 Unspecified asthma, uncomplicated: Secondary | ICD-10-CM | POA: Diagnosis not present

## 2014-04-17 DIAGNOSIS — R079 Chest pain, unspecified: Secondary | ICD-10-CM | POA: Diagnosis not present

## 2014-04-20 ENCOUNTER — Emergency Department (HOSPITAL_BASED_OUTPATIENT_CLINIC_OR_DEPARTMENT_OTHER)
Admission: EM | Admit: 2014-04-20 | Discharge: 2014-04-20 | Disposition: A | Discharge status: Home / Self Care | Payer: Medicare Other | Attending: Emergency Medicine | Admitting: Emergency Medicine

## 2014-04-20 ENCOUNTER — Encounter (HOSPITAL_BASED_OUTPATIENT_CLINIC_OR_DEPARTMENT_OTHER): Payer: Self-pay | Admitting: Emergency Medicine

## 2014-04-20 DIAGNOSIS — Z880 Allergy status to penicillin: Secondary | ICD-10-CM | POA: Insufficient documentation

## 2014-04-20 DIAGNOSIS — R6884 Jaw pain: Secondary | ICD-10-CM | POA: Insufficient documentation

## 2014-04-20 DIAGNOSIS — R1011 Right upper quadrant pain: Secondary | ICD-10-CM | POA: Insufficient documentation

## 2014-04-20 DIAGNOSIS — Z8614 Personal history of Methicillin resistant Staphylococcus aureus infection: Secondary | ICD-10-CM | POA: Insufficient documentation

## 2014-04-20 DIAGNOSIS — Z9889 Other specified postprocedural states: Secondary | ICD-10-CM | POA: Insufficient documentation

## 2014-04-20 DIAGNOSIS — Z8639 Personal history of other endocrine, nutritional and metabolic disease: Secondary | ICD-10-CM | POA: Diagnosis not present

## 2014-04-20 DIAGNOSIS — R1013 Epigastric pain: Secondary | ICD-10-CM | POA: Insufficient documentation

## 2014-04-20 DIAGNOSIS — G8918 Other acute postprocedural pain: Secondary | ICD-10-CM | POA: Insufficient documentation

## 2014-04-20 DIAGNOSIS — Z79899 Other long term (current) drug therapy: Secondary | ICD-10-CM | POA: Diagnosis not present

## 2014-04-20 DIAGNOSIS — K219 Gastro-esophageal reflux disease without esophagitis: Secondary | ICD-10-CM | POA: Diagnosis not present

## 2014-04-20 DIAGNOSIS — F40298 Other specified phobia: Secondary | ICD-10-CM | POA: Insufficient documentation

## 2014-04-20 DIAGNOSIS — R109 Unspecified abdominal pain: Secondary | ICD-10-CM

## 2014-04-20 DIAGNOSIS — Z87891 Personal history of nicotine dependence: Secondary | ICD-10-CM | POA: Diagnosis not present

## 2014-04-20 DIAGNOSIS — Z8542 Personal history of malignant neoplasm of other parts of uterus: Secondary | ICD-10-CM | POA: Insufficient documentation

## 2014-04-20 DIAGNOSIS — Z9089 Acquired absence of other organs: Secondary | ICD-10-CM | POA: Insufficient documentation

## 2014-04-20 DIAGNOSIS — Z85828 Personal history of other malignant neoplasm of skin: Secondary | ICD-10-CM | POA: Diagnosis not present

## 2014-04-20 DIAGNOSIS — I10 Essential (primary) hypertension: Secondary | ICD-10-CM | POA: Insufficient documentation

## 2014-04-20 DIAGNOSIS — G4730 Sleep apnea, unspecified: Secondary | ICD-10-CM | POA: Insufficient documentation

## 2014-04-20 DIAGNOSIS — G8929 Other chronic pain: Secondary | ICD-10-CM | POA: Insufficient documentation

## 2014-04-20 DIAGNOSIS — J45909 Unspecified asthma, uncomplicated: Secondary | ICD-10-CM | POA: Insufficient documentation

## 2014-04-20 DIAGNOSIS — Z862 Personal history of diseases of the blood and blood-forming organs and certain disorders involving the immune mechanism: Secondary | ICD-10-CM | POA: Diagnosis not present

## 2014-04-20 DIAGNOSIS — F4310 Post-traumatic stress disorder, unspecified: Secondary | ICD-10-CM | POA: Insufficient documentation

## 2014-04-20 DIAGNOSIS — Z9981 Dependence on supplemental oxygen: Secondary | ICD-10-CM | POA: Insufficient documentation

## 2014-04-20 DIAGNOSIS — IMO0002 Reserved for concepts with insufficient information to code with codable children: Secondary | ICD-10-CM | POA: Insufficient documentation

## 2014-04-20 DIAGNOSIS — G35 Multiple sclerosis: Secondary | ICD-10-CM | POA: Insufficient documentation

## 2014-04-20 LAB — CBC WITH DIFFERENTIAL/PLATELET
Basophils Absolute: 0 10*3/uL (ref 0.0–0.1)
Basophils Relative: 0 % (ref 0–1)
Eosinophils Absolute: 0.3 10*3/uL (ref 0.0–0.7)
Eosinophils Relative: 3 % (ref 0–5)
HCT: 38.2 % (ref 36.0–46.0)
Hemoglobin: 12.6 g/dL (ref 12.0–15.0)
Lymphocytes Relative: 21 % (ref 12–46)
Lymphs Abs: 1.7 10*3/uL (ref 0.7–4.0)
MCH: 29.4 pg (ref 26.0–34.0)
MCHC: 33 g/dL (ref 30.0–36.0)
MCV: 89 fL (ref 78.0–100.0)
Monocytes Absolute: 0.7 10*3/uL (ref 0.1–1.0)
Monocytes Relative: 9 % (ref 3–12)
Neutro Abs: 5.4 10*3/uL (ref 1.7–7.7)
Neutrophils Relative %: 67 % (ref 43–77)
Platelets: 229 10*3/uL (ref 150–400)
RBC: 4.29 MIL/uL (ref 3.87–5.11)
RDW: 13.8 % (ref 11.5–15.5)
WBC: 8.1 10*3/uL (ref 4.0–10.5)

## 2014-04-20 LAB — COMPREHENSIVE METABOLIC PANEL
ALT: 67 U/L — ABNORMAL HIGH (ref 0–35)
AST: 35 U/L (ref 0–37)
Albumin: 3.6 g/dL (ref 3.5–5.2)
Alkaline Phosphatase: 115 U/L (ref 39–117)
Anion gap: 14 (ref 5–15)
BUN: 17 mg/dL (ref 6–23)
CO2: 25 mEq/L (ref 19–32)
Calcium: 9.6 mg/dL (ref 8.4–10.5)
Chloride: 103 mEq/L (ref 96–112)
Creatinine, Ser: 0.6 mg/dL (ref 0.50–1.10)
GFR calc Af Amer: >90 mL/min (ref >90)
GFR calc non Af Amer: >90 mL/min (ref >90)
Glucose, Bld: 108 mg/dL — ABNORMAL HIGH (ref 70–99)
Potassium: 3.8 mEq/L (ref 3.7–5.3)
Sodium: 142 mEq/L (ref 137–147)
Total Bilirubin: 0.2 mg/dL — ABNORMAL LOW (ref 0.3–1.2)
Total Protein: 7.8 g/dL (ref 6.0–8.3)

## 2014-04-20 LAB — LIPASE, BLOOD: Lipase: 54 U/L (ref 11–59)

## 2014-04-20 MED ORDER — SODIUM CHLORIDE 0.9 % IV SOLN
Freq: Once | INTRAVENOUS | Status: AC
  Administered 2014-04-20: 22:00:00 via INTRAVENOUS

## 2014-04-20 NOTE — ED Notes (Signed)
PT presents to ED with complaints of jaw pain rt flank pain.Marland Kitchen
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
## 2014-04-20 NOTE — Discharge Instructions (Signed)

## 2014-04-21 ENCOUNTER — Ambulatory Visit (INDEPENDENT_AMBULATORY_CARE_PROVIDER_SITE_OTHER): Payer: Medicare Other | Admitting: Family Medicine

## 2014-04-21 ENCOUNTER — Encounter: Payer: Self-pay | Admitting: Family Medicine

## 2014-04-21 VITALS — BP 150/91 | HR 107 | Temp 98.2°F | Wt 185.0 lb

## 2014-04-21 DIAGNOSIS — R0981 Nasal congestion: Secondary | ICD-10-CM

## 2014-04-21 DIAGNOSIS — J3489 Other specified disorders of nose and nasal sinuses: Secondary | ICD-10-CM | POA: Diagnosis not present

## 2014-04-21 DIAGNOSIS — Z9089 Acquired absence of other organs: Secondary | ICD-10-CM | POA: Diagnosis not present

## 2014-04-21 DIAGNOSIS — G4733 Obstructive sleep apnea (adult) (pediatric): Secondary | ICD-10-CM

## 2014-04-21 DIAGNOSIS — Z9049 Acquired absence of other specified parts of digestive tract: Secondary | ICD-10-CM

## 2014-04-21 DIAGNOSIS — R6889 Other general symptoms and signs: Secondary | ICD-10-CM | POA: Diagnosis not present

## 2014-04-21 NOTE — Progress Notes (Signed)
Subjective:    Patient ID: Jennifer [name], female    DOB: 03/22/1966, 48 y.o.   MRN: [id number]  HPI Patient had a cholecystectomy performed last week. She did well overall procedure though she did have some postop nausea and vomiting. She does not want to use any pain medication. She still getting some occasional sweats. She felt like maybe she ran a low-grade temperature the weekend. She went to ED yesterday to get fluids. She wasn't drinking a lot. Using a stool softener.   Still some constipated   Stil having some sinus congestion. She does elicit some low-grade fevers over the weekend. No cough.  Still doesn't have her CPAP supplies from [place] . Says they are missing something so need that before they can deliver it.  They called her yesterday.  Did have some pos JC antibodies with Dr. Berdine Addison. He had called her and left her message on the day of her surgery. She said when she got the message she was just coming out of anesthesia and was very distraught by the message. He encouraged her call to make an appointment to discuss the results. She said she did call Friday but has not heard back from their office yet.  Review of Systems     Objective:   Physical Exam  Constitutional: She is oriented to person, place, and time. She appears well-developed and well-nourished.  HENT:  Head: Normocephalic and atraumatic.  Cardiovascular: Normal rate, regular rhythm and normal heart sounds.   Pulmonary/Chest: Effort normal and breath sounds normal.  Neurological: She is alert and oriented to person, place, and time.  Skin: Skin is warm and dry.  Psychiatric: She has a normal mood and affect. Her behavior is normal.          Assessment & Plan:  Status post cholecystectomy-overall she is doing well. Sciatica the emergency department yesterday for some epigastric and jaw pain that was radiating into her mid back. It seems to have resolved today. Encouraged her to continue work on hydration.  She also received a liter of IV fluids for some mild dehydration. She did have a CBC performed in the emergency department which was normal. This is reassuring that she likely does not have a postoperative infection.  Sinus congestion-no sign of acute infection at this point time. She can certainly call her this week if she feels like her symptoms are getting worse.  CPAP-we will check on with the home health company may need from [place] to get her supplies delivered. I'm not sure what it is taking so long.  Strongly encouraged her to schedule her appointment with Dr. Berdine Addison to discuss her test results. He had asked that she try to call to see she did on Thursday here in the office and [place]. She said she did call back on Friday but never never received a return phone call. I encouraged her to try call back again today.  She feels emotionally overwhelmed. She said during her hospitalization she most felt suicidal. She talked to the priest at the hospital and that was helpful. She does have a psychiatrist and counselor that she sees. She just feels like when she starting to get her for one health problems she is diagnosed with a new health problem. I just encourage her to wait to be today in face today is that she can.  Time spent 50 minutes, greater than 50% of the time spent counseling about her gallbladder liver, sinus congestion, CPAP, and abnormal test results.

## 2014-04-21 NOTE — ED Provider Notes (Signed)
History/physical exam/procedure(s) were performed by non-physician practitioner and as supervising physician I was immediately available for consultation/collaboration. I have reviewed all notes and am in agreement with care and plan.   Shaune Pollack, MD 04/21/14 1016

## 2014-04-22 DIAGNOSIS — R002 Palpitations: Secondary | ICD-10-CM | POA: Diagnosis not present

## 2014-04-22 DIAGNOSIS — G35 Multiple sclerosis: Secondary | ICD-10-CM | POA: Diagnosis not present

## 2014-04-22 DIAGNOSIS — K2270 Barrett's esophagus without dysplasia: Secondary | ICD-10-CM | POA: Diagnosis not present

## 2014-04-22 DIAGNOSIS — R0602 Shortness of breath: Secondary | ICD-10-CM | POA: Diagnosis not present

## 2014-04-22 DIAGNOSIS — E876 Hypokalemia: Secondary | ICD-10-CM | POA: Diagnosis not present

## 2014-04-22 DIAGNOSIS — Z8673 Personal history of transient ischemic attack (TIA), and cerebral infarction without residual deficits: Secondary | ICD-10-CM | POA: Diagnosis not present

## 2014-04-22 DIAGNOSIS — R05 Cough: Secondary | ICD-10-CM | POA: Diagnosis not present

## 2014-04-22 DIAGNOSIS — G8929 Other chronic pain: Secondary | ICD-10-CM | POA: Diagnosis not present

## 2014-04-22 DIAGNOSIS — I10 Essential (primary) hypertension: Secondary | ICD-10-CM | POA: Diagnosis not present

## 2014-04-22 DIAGNOSIS — R062 Wheezing: Secondary | ICD-10-CM | POA: Diagnosis not present

## 2014-04-22 DIAGNOSIS — R000 Tachycardia, unspecified: Secondary | ICD-10-CM | POA: Diagnosis not present

## 2014-04-22 DIAGNOSIS — R059 Cough, unspecified: Secondary | ICD-10-CM | POA: Diagnosis not present

## 2014-04-22 DIAGNOSIS — E269 Hyperaldosteronism, unspecified: Secondary | ICD-10-CM | POA: Diagnosis not present

## 2014-04-23 DIAGNOSIS — G501 Atypical facial pain: Secondary | ICD-10-CM | POA: Diagnosis not present

## 2014-04-23 DIAGNOSIS — I69998 Other sequelae following unspecified cerebrovascular disease: Secondary | ICD-10-CM | POA: Diagnosis not present

## 2014-04-23 DIAGNOSIS — G609 Hereditary and idiopathic neuropathy, unspecified: Secondary | ICD-10-CM | POA: Diagnosis not present

## 2014-04-23 DIAGNOSIS — R42 Dizziness and giddiness: Secondary | ICD-10-CM | POA: Diagnosis not present

## 2014-04-23 DIAGNOSIS — R839 Unspecified abnormal finding in cerebrospinal fluid: Secondary | ICD-10-CM | POA: Diagnosis not present

## 2014-04-24 ENCOUNTER — Telehealth: Payer: Self-pay

## 2014-04-24 DIAGNOSIS — Z91018 Allergy to other foods: Secondary | ICD-10-CM | POA: Diagnosis not present

## 2014-04-24 DIAGNOSIS — J45909 Unspecified asthma, uncomplicated: Secondary | ICD-10-CM | POA: Diagnosis not present

## 2014-04-24 DIAGNOSIS — R5381 Other malaise: Secondary | ICD-10-CM | POA: Diagnosis not present

## 2014-04-24 DIAGNOSIS — Z9851 Tubal ligation status: Secondary | ICD-10-CM | POA: Diagnosis not present

## 2014-04-24 DIAGNOSIS — Z79899 Other long term (current) drug therapy: Secondary | ICD-10-CM | POA: Diagnosis not present

## 2014-04-24 DIAGNOSIS — Z9889 Other specified postprocedural states: Secondary | ICD-10-CM | POA: Diagnosis not present

## 2014-04-24 DIAGNOSIS — G35 Multiple sclerosis: Secondary | ICD-10-CM | POA: Diagnosis not present

## 2014-04-24 DIAGNOSIS — K219 Gastro-esophageal reflux disease without esophagitis: Secondary | ICD-10-CM | POA: Diagnosis not present

## 2014-04-24 DIAGNOSIS — Z883 Allergy status to other anti-infective agents status: Secondary | ICD-10-CM | POA: Diagnosis not present

## 2014-04-24 DIAGNOSIS — G43909 Migraine, unspecified, not intractable, without status migrainosus: Secondary | ICD-10-CM | POA: Diagnosis not present

## 2014-04-24 DIAGNOSIS — R5383 Other fatigue: Secondary | ICD-10-CM | POA: Diagnosis not present

## 2014-04-24 DIAGNOSIS — R1011 Right upper quadrant pain: Secondary | ICD-10-CM | POA: Diagnosis not present

## 2014-04-24 DIAGNOSIS — E785 Hyperlipidemia, unspecified: Secondary | ICD-10-CM | POA: Diagnosis not present

## 2014-04-24 DIAGNOSIS — N200 Calculus of kidney: Secondary | ICD-10-CM | POA: Diagnosis not present

## 2014-04-24 DIAGNOSIS — Z881 Allergy status to other antibiotic agents status: Secondary | ICD-10-CM | POA: Diagnosis not present

## 2014-04-24 DIAGNOSIS — Z87891 Personal history of nicotine dependence: Secondary | ICD-10-CM | POA: Diagnosis not present

## 2014-04-24 DIAGNOSIS — Z791 Long term (current) use of non-steroidal anti-inflammatories (NSAID): Secondary | ICD-10-CM | POA: Diagnosis not present

## 2014-04-24 DIAGNOSIS — I10 Essential (primary) hypertension: Secondary | ICD-10-CM | POA: Diagnosis not present

## 2014-04-24 DIAGNOSIS — Z9089 Acquired absence of other organs: Secondary | ICD-10-CM | POA: Diagnosis not present

## 2014-04-24 DIAGNOSIS — Z888 Allergy status to other drugs, medicaments and biological substances status: Secondary | ICD-10-CM | POA: Diagnosis not present

## 2014-04-24 DIAGNOSIS — F411 Generalized anxiety disorder: Secondary | ICD-10-CM | POA: Diagnosis not present

## 2014-04-24 DIAGNOSIS — Z882 Allergy status to sulfonamides status: Secondary | ICD-10-CM | POA: Diagnosis not present

## 2014-04-24 DIAGNOSIS — Z880 Allergy status to penicillin: Secondary | ICD-10-CM | POA: Diagnosis not present

## 2014-04-24 DIAGNOSIS — R109 Unspecified abdominal pain: Secondary | ICD-10-CM | POA: Diagnosis not present

## 2014-04-24 DIAGNOSIS — G8918 Other acute postprocedural pain: Secondary | ICD-10-CM | POA: Diagnosis not present

## 2014-04-24 DIAGNOSIS — Z886 Allergy status to analgesic agent status: Secondary | ICD-10-CM | POA: Diagnosis not present

## 2014-04-24 NOTE — Telephone Encounter (Signed)
Verdell Face, Care Coordinator with [facility]  from her recent surgery, called and left a message stating Deamber is complaining of shortness of breath and sore throat. Khalila did come in this morning wanting an appointment. She was advised by Tammy that there was no openings. Tammy did recommend she go to the Urgent Care. Audriella stated she would go to the ED. I called Ivyana and left a message asking her to call back and update me on her symptoms and if she did in fact go to the ED.   I returned call to Theodoro Kos and updated her on Marella Vanderpol.

## 2014-04-27 ENCOUNTER — Telehealth: Payer: Self-pay | Admitting: Family Medicine

## 2014-04-27 DIAGNOSIS — G4730 Sleep apnea, unspecified: Secondary | ICD-10-CM

## 2014-04-27 DIAGNOSIS — G4733 Obstructive sleep apnea (adult) (pediatric): Secondary | ICD-10-CM

## 2014-04-27 NOTE — Telephone Encounter (Signed)
[employer] Called to let us know patient's insurance will not approve her for Bi pap, Because she needs documentation showing she had a Titration Study on Cpap showing she needs to be switched to Bi-Pap... Thanks, [employer]

## 2014-04-28 NOTE — Telephone Encounter (Signed)
Patient called back this morning to request a referral to her Sleep Dr.- Neuro-- ? Dr. Rollene Rotunda -- the one who documented on her Sleep report from [facility]... She is thinking that maybe if she follows-up with him, He may be able to get her approved for Bi Pap.Marland KitchenMarland Kitchen

## 2014-04-28 NOTE — Telephone Encounter (Signed)
Referral placed.Jennifer [name]  

## 2014-04-29 DIAGNOSIS — G35 Multiple sclerosis: Secondary | ICD-10-CM | POA: Diagnosis not present

## 2014-04-29 DIAGNOSIS — R42 Dizziness and giddiness: Secondary | ICD-10-CM | POA: Diagnosis not present

## 2014-04-29 DIAGNOSIS — K7689 Other specified diseases of liver: Secondary | ICD-10-CM | POA: Diagnosis not present

## 2014-04-29 DIAGNOSIS — R002 Palpitations: Secondary | ICD-10-CM | POA: Diagnosis not present

## 2014-04-29 DIAGNOSIS — Z8673 Personal history of transient ischemic attack (TIA), and cerebral infarction without residual deficits: Secondary | ICD-10-CM | POA: Diagnosis not present

## 2014-04-29 DIAGNOSIS — E876 Hypokalemia: Secondary | ICD-10-CM | POA: Diagnosis not present

## 2014-04-29 DIAGNOSIS — I10 Essential (primary) hypertension: Secondary | ICD-10-CM | POA: Diagnosis not present

## 2014-04-30 IMAGING — CR DG CHEST 2V
2 series · 2 of 2 positions shown · non-contrast
Comparison: 01/06/2013

CLINICAL DATA: Cough, chest tightness.

CHEST - 2 VIEW

[w chest pa]
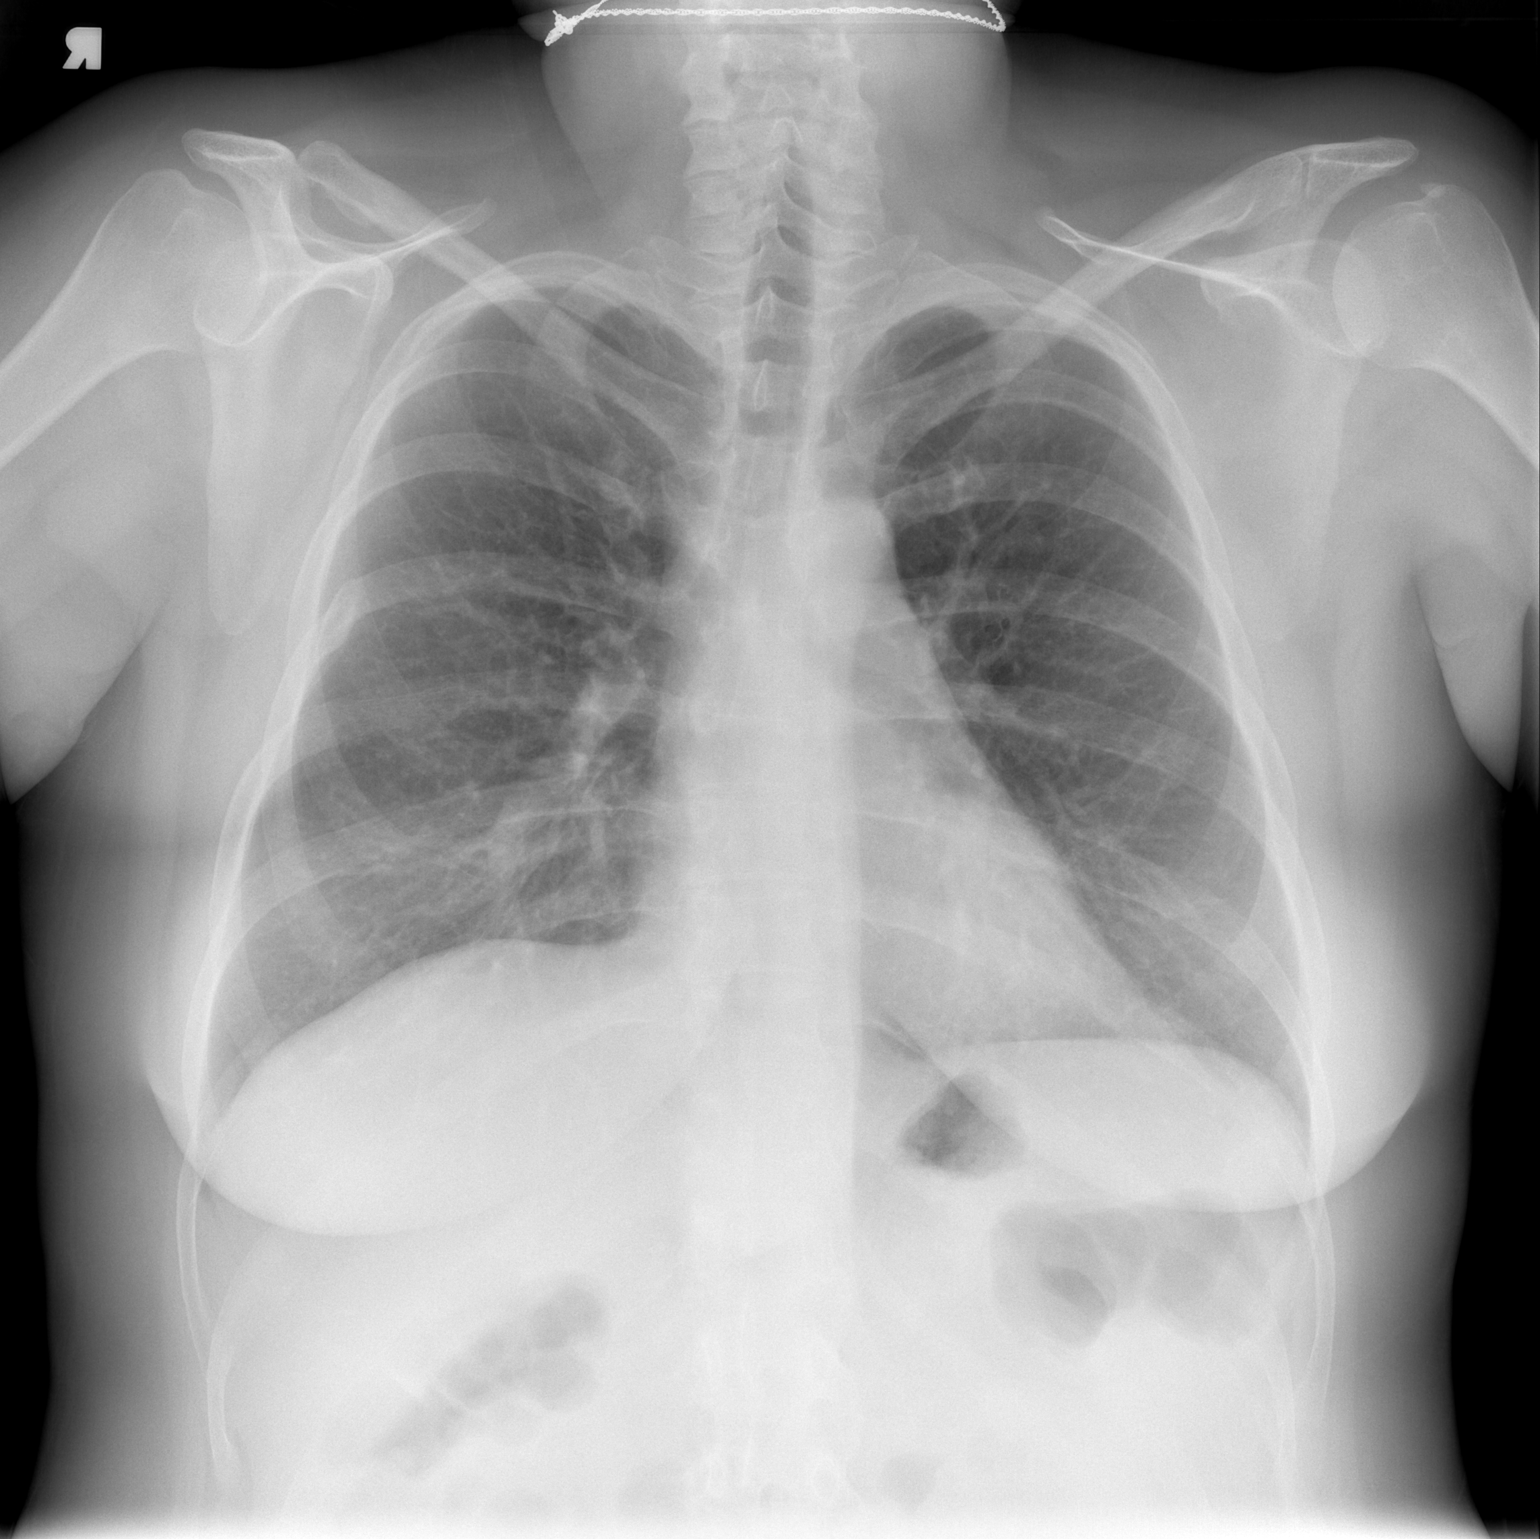

[w chest lat]
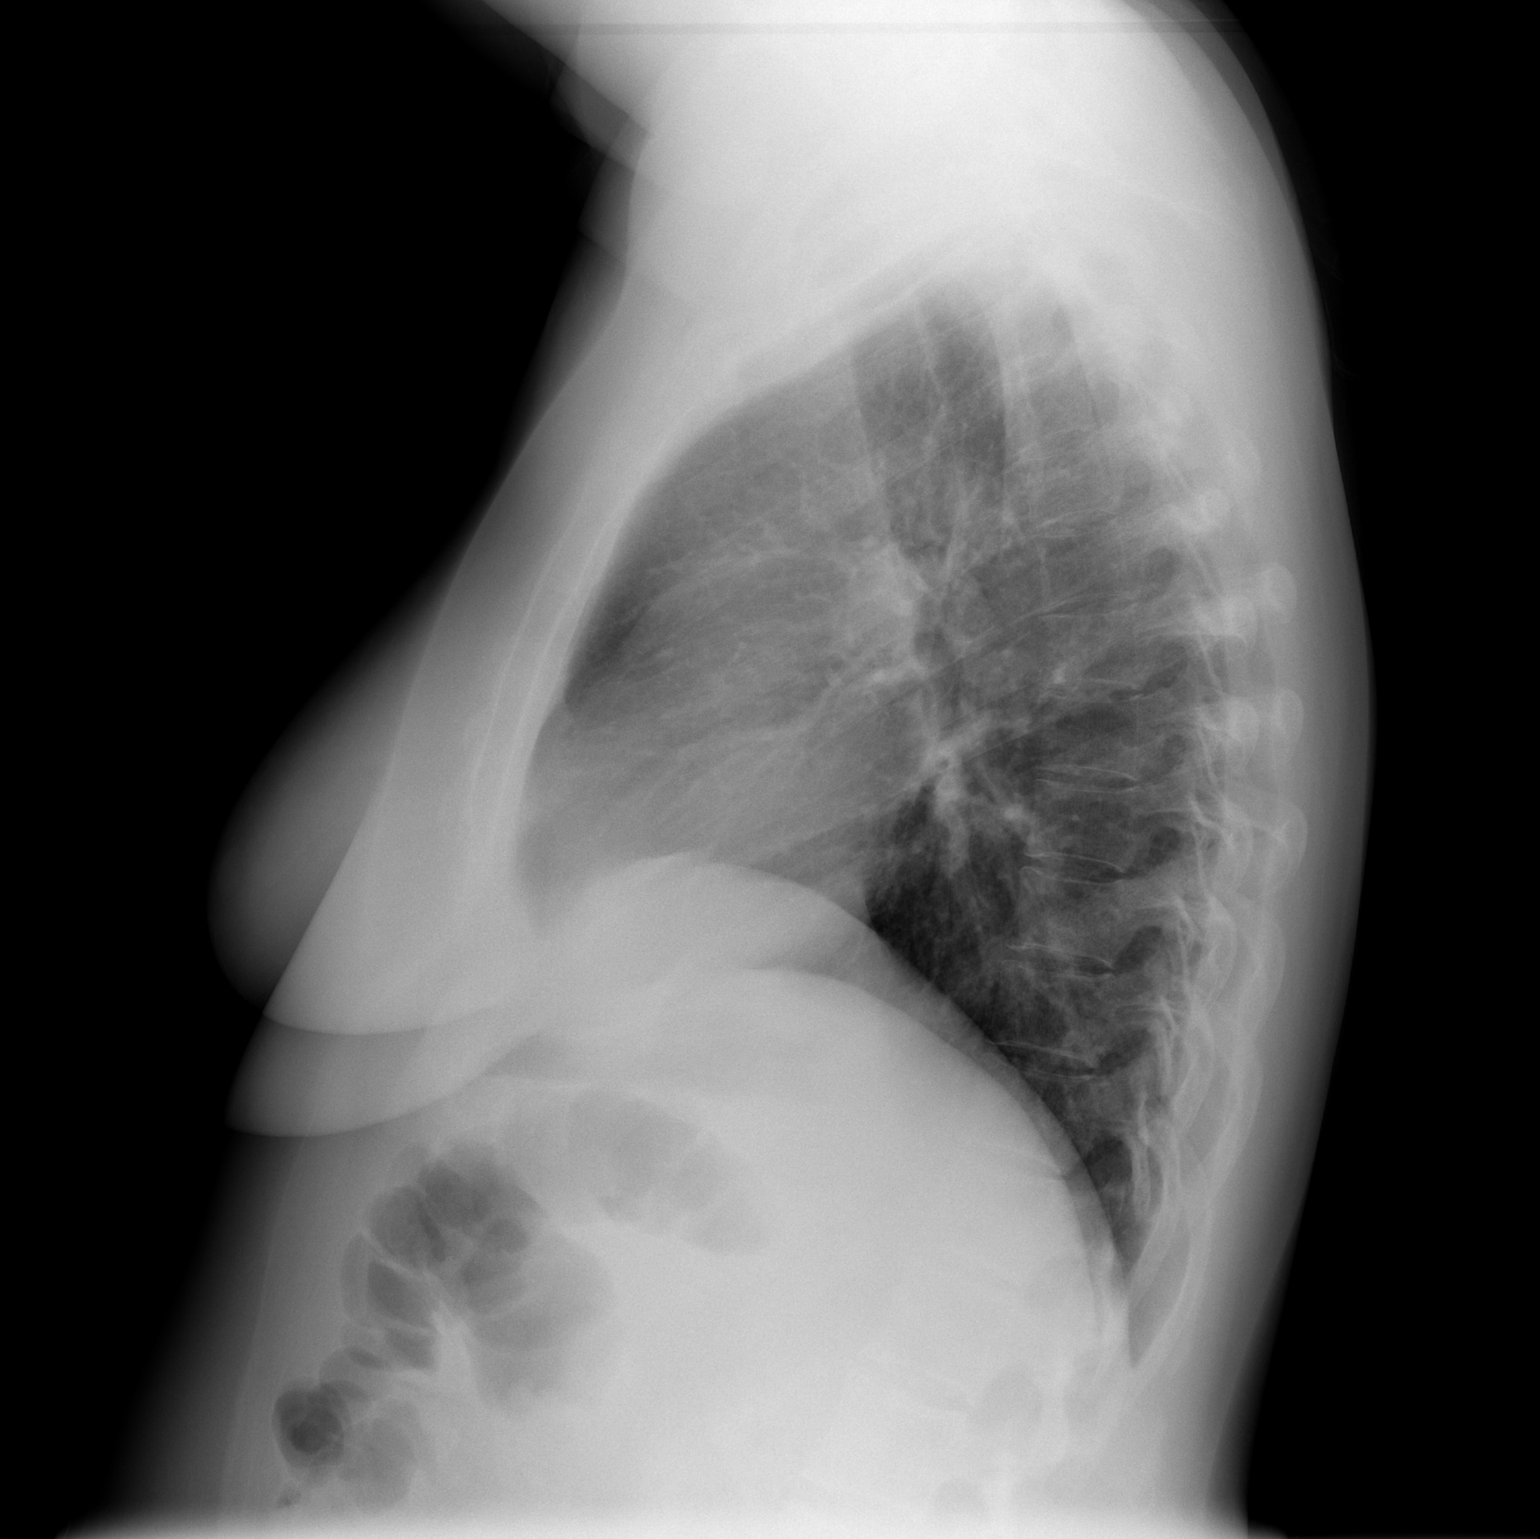

[2 of 2 positions shown; findings below may reference images not displayed]

FINDINGS: Heart is normal size.  Lungs are clear.  No effusions.
No acute bony abnormality.  Old healed right rib fractures noted.
IMPRESSION: No acute cardiopulmonary disease.

## 2014-05-01 ENCOUNTER — Other Ambulatory Visit: Payer: Self-pay

## 2014-05-01 ENCOUNTER — Telehealth: Payer: Self-pay | Admitting: *Deleted

## 2014-05-01 DIAGNOSIS — R569 Unspecified convulsions: Secondary | ICD-10-CM | POA: Diagnosis not present

## 2014-05-01 DIAGNOSIS — K8309 Other cholangitis: Secondary | ICD-10-CM | POA: Diagnosis not present

## 2014-05-01 DIAGNOSIS — Z9049 Acquired absence of other specified parts of digestive tract: Secondary | ICD-10-CM | POA: Diagnosis not present

## 2014-05-01 DIAGNOSIS — D649 Anemia, unspecified: Secondary | ICD-10-CM | POA: Diagnosis not present

## 2014-05-01 DIAGNOSIS — C55 Malignant neoplasm of uterus, part unspecified: Secondary | ICD-10-CM | POA: Diagnosis not present

## 2014-05-01 DIAGNOSIS — F4310 Post-traumatic stress disorder, unspecified: Secondary | ICD-10-CM | POA: Diagnosis not present

## 2014-05-01 MED ORDER — TELMISARTAN 20 MG PO TABS: 10.0000 mg | ORAL_TABLET | Freq: Every day | ORAL | Status: DC

## 2014-05-01 NOTE — Telephone Encounter (Signed)
Patient request rx for Micardis 10mg  #30 0 R sent to [facility]. [name],CMA

## 2014-05-01 NOTE — Telephone Encounter (Signed)
Jennifer [name] called and she said that she is have BP spikes again and asked if you would send Norvasc to her pharmacy. She was at her hemo dr and her pressure was 170/90. Please advise. [name], CMA

## 2014-05-01 NOTE — Telephone Encounter (Signed)
There was also another message left with patient requesting my card is to be sent. So we sent a prescription for Micardis. I she prefers Norvasc then please let me know.

## 2014-05-04 DIAGNOSIS — R141 Gas pain: Secondary | ICD-10-CM | POA: Diagnosis not present

## 2014-05-04 DIAGNOSIS — K5900 Constipation, unspecified: Secondary | ICD-10-CM | POA: Diagnosis not present

## 2014-05-04 DIAGNOSIS — R1310 Dysphagia, unspecified: Secondary | ICD-10-CM | POA: Diagnosis not present

## 2014-05-04 DIAGNOSIS — R1011 Right upper quadrant pain: Secondary | ICD-10-CM | POA: Diagnosis not present

## 2014-05-04 DIAGNOSIS — K589 Irritable bowel syndrome without diarrhea: Secondary | ICD-10-CM | POA: Diagnosis not present

## 2014-05-05 DIAGNOSIS — Z8542 Personal history of malignant neoplasm of other parts of uterus: Secondary | ICD-10-CM | POA: Diagnosis not present

## 2014-05-05 DIAGNOSIS — R569 Unspecified convulsions: Secondary | ICD-10-CM | POA: Diagnosis not present

## 2014-05-05 DIAGNOSIS — J45909 Unspecified asthma, uncomplicated: Secondary | ICD-10-CM | POA: Diagnosis not present

## 2014-05-05 DIAGNOSIS — Z881 Allergy status to other antibiotic agents status: Secondary | ICD-10-CM | POA: Diagnosis not present

## 2014-05-05 DIAGNOSIS — Z79899 Other long term (current) drug therapy: Secondary | ICD-10-CM | POA: Diagnosis not present

## 2014-05-05 DIAGNOSIS — R7309 Other abnormal glucose: Secondary | ICD-10-CM | POA: Diagnosis not present

## 2014-05-05 DIAGNOSIS — I10 Essential (primary) hypertension: Secondary | ICD-10-CM | POA: Diagnosis not present

## 2014-05-05 DIAGNOSIS — E269 Hyperaldosteronism, unspecified: Secondary | ICD-10-CM | POA: Diagnosis not present

## 2014-05-05 DIAGNOSIS — M549 Dorsalgia, unspecified: Secondary | ICD-10-CM | POA: Diagnosis not present

## 2014-05-05 DIAGNOSIS — G35 Multiple sclerosis: Secondary | ICD-10-CM | POA: Diagnosis not present

## 2014-05-05 DIAGNOSIS — E785 Hyperlipidemia, unspecified: Secondary | ICD-10-CM | POA: Diagnosis not present

## 2014-05-05 DIAGNOSIS — K2270 Barrett's esophagus without dysplasia: Secondary | ICD-10-CM | POA: Diagnosis not present

## 2014-05-05 DIAGNOSIS — Z880 Allergy status to penicillin: Secondary | ICD-10-CM | POA: Diagnosis not present

## 2014-05-05 DIAGNOSIS — Z886 Allergy status to analgesic agent status: Secondary | ICD-10-CM | POA: Diagnosis not present

## 2014-05-05 DIAGNOSIS — Z888 Allergy status to other drugs, medicaments and biological substances status: Secondary | ICD-10-CM | POA: Diagnosis not present

## 2014-05-05 DIAGNOSIS — R110 Nausea: Secondary | ICD-10-CM | POA: Diagnosis not present

## 2014-05-05 DIAGNOSIS — Z9889 Other specified postprocedural states: Secondary | ICD-10-CM | POA: Diagnosis not present

## 2014-05-05 DIAGNOSIS — Z91018 Allergy to other foods: Secondary | ICD-10-CM | POA: Diagnosis not present

## 2014-05-05 DIAGNOSIS — R1011 Right upper quadrant pain: Secondary | ICD-10-CM | POA: Diagnosis not present

## 2014-05-05 DIAGNOSIS — K219 Gastro-esophageal reflux disease without esophagitis: Secondary | ICD-10-CM | POA: Diagnosis not present

## 2014-05-05 DIAGNOSIS — D509 Iron deficiency anemia, unspecified: Secondary | ICD-10-CM | POA: Diagnosis not present

## 2014-05-05 DIAGNOSIS — Z87891 Personal history of nicotine dependence: Secondary | ICD-10-CM | POA: Diagnosis not present

## 2014-05-05 DIAGNOSIS — G8929 Other chronic pain: Secondary | ICD-10-CM | POA: Diagnosis not present

## 2014-05-05 DIAGNOSIS — F411 Generalized anxiety disorder: Secondary | ICD-10-CM | POA: Diagnosis not present

## 2014-05-05 DIAGNOSIS — G43909 Migraine, unspecified, not intractable, without status migrainosus: Secondary | ICD-10-CM | POA: Diagnosis not present

## 2014-05-06 ENCOUNTER — Encounter (HOSPITAL_BASED_OUTPATIENT_CLINIC_OR_DEPARTMENT_OTHER): Payer: Self-pay | Admitting: Emergency Medicine

## 2014-05-06 ENCOUNTER — Emergency Department (HOSPITAL_BASED_OUTPATIENT_CLINIC_OR_DEPARTMENT_OTHER)
Admission: EM | Admit: 2014-05-06 | Discharge: 2014-05-06 | Disposition: A | Discharge status: Home / Self Care | Payer: Medicare Other | Attending: Emergency Medicine | Admitting: Emergency Medicine

## 2014-05-06 DIAGNOSIS — J45909 Unspecified asthma, uncomplicated: Secondary | ICD-10-CM | POA: Diagnosis not present

## 2014-05-06 DIAGNOSIS — G8929 Other chronic pain: Secondary | ICD-10-CM | POA: Insufficient documentation

## 2014-05-06 DIAGNOSIS — K589 Irritable bowel syndrome without diarrhea: Secondary | ICD-10-CM | POA: Insufficient documentation

## 2014-05-06 DIAGNOSIS — Z862 Personal history of diseases of the blood and blood-forming organs and certain disorders involving the immune mechanism: Secondary | ICD-10-CM | POA: Diagnosis not present

## 2014-05-06 DIAGNOSIS — Z9981 Dependence on supplemental oxygen: Secondary | ICD-10-CM | POA: Diagnosis not present

## 2014-05-06 DIAGNOSIS — Z8614 Personal history of Methicillin resistant Staphylococcus aureus infection: Secondary | ICD-10-CM | POA: Diagnosis not present

## 2014-05-06 DIAGNOSIS — Z85828 Personal history of other malignant neoplasm of skin: Secondary | ICD-10-CM | POA: Insufficient documentation

## 2014-05-06 DIAGNOSIS — Z3202 Encounter for pregnancy test, result negative: Secondary | ICD-10-CM | POA: Diagnosis not present

## 2014-05-06 DIAGNOSIS — Z87891 Personal history of nicotine dependence: Secondary | ICD-10-CM | POA: Diagnosis not present

## 2014-05-06 DIAGNOSIS — R1011 Right upper quadrant pain: Secondary | ICD-10-CM | POA: Insufficient documentation

## 2014-05-06 DIAGNOSIS — I10 Essential (primary) hypertension: Secondary | ICD-10-CM | POA: Diagnosis not present

## 2014-05-06 DIAGNOSIS — R110 Nausea: Secondary | ICD-10-CM | POA: Insufficient documentation

## 2014-05-06 DIAGNOSIS — K219 Gastro-esophageal reflux disease without esophagitis: Secondary | ICD-10-CM | POA: Insufficient documentation

## 2014-05-06 DIAGNOSIS — F40298 Other specified phobia: Secondary | ICD-10-CM | POA: Diagnosis not present

## 2014-05-06 DIAGNOSIS — Z8542 Personal history of malignant neoplasm of other parts of uterus: Secondary | ICD-10-CM | POA: Insufficient documentation

## 2014-05-06 DIAGNOSIS — R1013 Epigastric pain: Secondary | ICD-10-CM | POA: Insufficient documentation

## 2014-05-06 DIAGNOSIS — F339 Major depressive disorder, recurrent, unspecified: Secondary | ICD-10-CM | POA: Diagnosis not present

## 2014-05-06 DIAGNOSIS — Z9889 Other specified postprocedural states: Secondary | ICD-10-CM | POA: Insufficient documentation

## 2014-05-06 DIAGNOSIS — IMO0002 Reserved for concepts with insufficient information to code with codable children: Secondary | ICD-10-CM | POA: Insufficient documentation

## 2014-05-06 DIAGNOSIS — Z8639 Personal history of other endocrine, nutritional and metabolic disease: Secondary | ICD-10-CM | POA: Diagnosis not present

## 2014-05-06 DIAGNOSIS — G4730 Sleep apnea, unspecified: Secondary | ICD-10-CM | POA: Diagnosis not present

## 2014-05-06 DIAGNOSIS — Z79899 Other long term (current) drug therapy: Secondary | ICD-10-CM | POA: Diagnosis not present

## 2014-05-06 DIAGNOSIS — Z880 Allergy status to penicillin: Secondary | ICD-10-CM | POA: Insufficient documentation

## 2014-05-06 LAB — URINALYSIS, ROUTINE W REFLEX MICROSCOPIC
Bilirubin Urine: NEGATIVE
Glucose, UA: NEGATIVE mg/dL
Hgb urine dipstick: NEGATIVE
Ketones, ur: NEGATIVE mg/dL
Leukocytes, UA: NEGATIVE
Nitrite: NEGATIVE
Protein, ur: NEGATIVE mg/dL
Specific Gravity, Urine: 1.025 (ref 1.005–1.030)
Urobilinogen, UA: 0.2 mg/dL (ref 0.0–1.0)
pH: 5.5 (ref 5.0–8.0)

## 2014-05-06 LAB — CBC WITH DIFFERENTIAL/PLATELET
Basophils Absolute: 0 10*3/uL (ref 0.0–0.1)
Basophils Relative: 1 % (ref 0–1)
Eosinophils Absolute: 0.2 10*3/uL (ref 0.0–0.7)
Eosinophils Relative: 2 % (ref 0–5)
HCT: 38.4 % (ref 36.0–46.0)
Hemoglobin: 12.5 g/dL (ref 12.0–15.0)
Lymphocytes Relative: 23 % (ref 12–46)
Lymphs Abs: 1.4 10*3/uL (ref 0.7–4.0)
MCH: 28.7 pg (ref 26.0–34.0)
MCHC: 32.6 g/dL (ref 30.0–36.0)
MCV: 88.1 fL (ref 78.0–100.0)
Monocytes Absolute: 0.5 10*3/uL (ref 0.1–1.0)
Monocytes Relative: 8 % (ref 3–12)
Neutro Abs: 4.1 10*3/uL (ref 1.7–7.7)
Neutrophils Relative %: 66 % (ref 43–77)
Platelets: 212 10*3/uL (ref 150–400)
RBC: 4.36 MIL/uL (ref 3.87–5.11)
RDW: 13.7 % (ref 11.5–15.5)
WBC: 6.2 10*3/uL (ref 4.0–10.5)

## 2014-05-06 LAB — COMPREHENSIVE METABOLIC PANEL
ALT: 24 U/L (ref 0–35)
AST: 16 U/L (ref 0–37)
Albumin: 3.6 g/dL (ref 3.5–5.2)
Alkaline Phosphatase: 94 U/L (ref 39–117)
Anion gap: 14 (ref 5–15)
BUN: 12 mg/dL (ref 6–23)
CO2: 22 mEq/L (ref 19–32)
Calcium: 9.2 mg/dL (ref 8.4–10.5)
Chloride: 105 mEq/L (ref 96–112)
Creatinine, Ser: 0.5 mg/dL (ref 0.50–1.10)
GFR calc Af Amer: >90 mL/min (ref >90)
GFR calc non Af Amer: >90 mL/min (ref >90)
Glucose, Bld: 103 mg/dL — ABNORMAL HIGH (ref 70–99)
Potassium: 3.8 mEq/L (ref 3.7–5.3)
Sodium: 141 mEq/L (ref 137–147)
Total Bilirubin: <0.2 mg/dL — ABNORMAL LOW (ref 0.3–1.2)
Total Protein: 7.3 g/dL (ref 6.0–8.3)

## 2014-05-06 LAB — PREGNANCY, URINE: Preg Test, Ur: NEGATIVE

## 2014-05-06 LAB — LIPASE, BLOOD: Lipase: 50 U/L (ref 11–59)

## 2014-05-06 MED ORDER — IBUPROFEN 400 MG PO TABS
400.0000 mg | ORAL_TABLET | Freq: Once | ORAL | Status: AC
  Administered 2014-05-06: 400 mg via ORAL
  Filled 2014-05-06: qty 1

## 2014-05-06 MED ORDER — ONDANSETRON 4 MG PO TBDP
4.0000 mg | ORAL_TABLET | Freq: Once | ORAL | Status: AC
  Administered 2014-05-06: 4 mg via ORAL
  Filled 2014-05-06: qty 1

## 2014-05-06 NOTE — ED Notes (Signed)
Pt had gallbladder out on 9/3- reports she is still having pain- states she had a MRI to evaluate for stone left in duct today- Went to [facility] but states the wait was too long so came here instead- Pt tearful and yelling in lobby "I'm in pain" - reassurance offered- pt now calmly talking on cell phone in lobby
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
## 2014-05-06 NOTE — Telephone Encounter (Signed)
Patient stated that she was expecting norvasc or micardis. This is fine. [name], CMA

## 2014-05-06 NOTE — Discharge Instructions (Signed)

## 2014-05-08 ENCOUNTER — Telehealth: Payer: Self-pay | Admitting: *Deleted

## 2014-05-08 ENCOUNTER — Encounter: Payer: Self-pay | Admitting: Family Medicine

## 2014-05-08 ENCOUNTER — Ambulatory Visit (INDEPENDENT_AMBULATORY_CARE_PROVIDER_SITE_OTHER): Payer: Medicare Other

## 2014-05-08 ENCOUNTER — Ambulatory Visit (INDEPENDENT_AMBULATORY_CARE_PROVIDER_SITE_OTHER): Payer: Medicare Other | Admitting: Family Medicine

## 2014-05-08 VITALS — BP 147/95 | HR 86 | Temp 98.3°F | Wt 186.0 lb

## 2014-05-08 DIAGNOSIS — F329 Major depressive disorder, single episode, unspecified: Secondary | ICD-10-CM | POA: Diagnosis not present

## 2014-05-08 DIAGNOSIS — I10 Essential (primary) hypertension: Secondary | ICD-10-CM

## 2014-05-08 DIAGNOSIS — F32A Depression, unspecified: Secondary | ICD-10-CM

## 2014-05-08 DIAGNOSIS — Z1159 Encounter for screening for other viral diseases: Secondary | ICD-10-CM

## 2014-05-08 DIAGNOSIS — R1011 Right upper quadrant pain: Secondary | ICD-10-CM

## 2014-05-08 DIAGNOSIS — F3289 Other specified depressive episodes: Secondary | ICD-10-CM | POA: Diagnosis not present

## 2014-05-08 LAB — POCT URINALYSIS DIPSTICK
Bilirubin, UA: NEGATIVE
Blood, UA: NEGATIVE
Glucose, UA: NEGATIVE
Ketones, UA: NEGATIVE
Leukocytes, UA: NEGATIVE
Nitrite, UA: NEGATIVE
Protein, UA: NEGATIVE
Spec Grav, UA: 1.02
Urobilinogen, UA: 0.2
pH, UA: 7

## 2014-05-08 LAB — HEPATITIS B SURFACE ANTIBODY,QUALITATIVE: Hep B S Ab: NEGATIVE

## 2014-05-08 MED ORDER — AMLODIPINE BESYLATE 2.5 MG PO TABS: 1.2500 mg | ORAL_TABLET | Freq: Every day | ORAL | Status: DC

## 2014-05-08 NOTE — Progress Notes (Signed)
   Subjective:    Patient ID: Jennifer [name], female    DOB: 1966/01/28, 48 y.o.   MRN: [id number]  HPI HTN - wanst to change to Norvasc.   She called stating that she wanted to go ahead and restart her blood pressure medication. We did send in a prescription for my card is. But she really wanted the Norvasc since then.  Still having sharp pain in the LUQ.  Feels like a ball underneath. Saw her GI.  Dr Dorrene German.  Had an MRCP and that was negative on Wednesday. Hasn't felt well.  He was also told she has a fatty liver.  Did have post vomiting and wonders if this may have caused some of her pain. Wonders if could be a hernia. For the last week the pain has been moe persistent.     [name] had recommended Hep A/B.  She thinks had 2 Hep B shots years ago. Not sure if had 3rd. Never had Hep A.     Review of Systems     Objective:   Physical Exam  Constitutional: She is oriented to person, place, and time. She appears well-developed and well-nourished.  HENT:  Head: Normocephalic and atraumatic.  Eyes: Conjunctivae and EOM are normal.  Cardiovascular: Normal rate, regular rhythm and normal heart sounds.   Pulmonary/Chest: Effort normal and breath sounds normal.  Abdominal: Soft. Bowel sounds are normal. She exhibits no distension and no mass. There is tenderness. There is no rebound and no guarding.  TTP in the RUQ and in the epigastric area   Neurological: She is alert and oriented to person, place, and time.  Skin: Skin is dry. No pallor.  Psychiatric: She has a normal mood and affect. Her behavior is normal.          Assessment & Plan:  LUQ - unclear etiology.  He appears much reassurance as I could. She's had her gallbladder removed which is helpful. She's also had a HIDA scan to rule out any type of stone and the ducts. Certainly could be related to scar tissue etc. She is worried about a hernia but I do not feel anything consistent with a hernia. I gave her reassurance. Her  bowel have not moved well the last few days so encouraged her to really focus on getting her bowels moving as constipation can cause abdominal pain is localized as well.  Depression- unfortunately I do think she is quite moderately severe to severely depressed today. Encouraged her to work with her therapist on this. In fact she actually went back to see her old therapist about a week ago and said she just didn't feel connected to her anymore. This is a point her like it's very stressful and I really think she really needs to be heavily involved in counseling. She says her marriage is not going well and not very stressful for her as well. I strongly encouraged her to think about an antidepressant over the weekend.  Screen for immunity to Hep B. will likely need hepatitis A vaccination series but will test her immunity to hepatitis B to see if she is a candidate for the twinrix injections.  HTN - will send in new rx for norvasc.   Time spent 50 min, >50% spent counseling about her pain and depression.

## 2014-05-08 NOTE — Telephone Encounter (Signed)
Milo was calling xray results. [name], CMA

## 2014-05-08 NOTE — Patient Instructions (Signed)
Really work on bland diet.

## 2014-05-08 NOTE — Telephone Encounter (Signed)
Patient given results.  Work on getting bowels moving.  Keep f/u appt.

## 2014-05-11 ENCOUNTER — Encounter: Payer: Self-pay | Admitting: Family Medicine

## 2014-05-12 DIAGNOSIS — R51 Headache: Secondary | ICD-10-CM | POA: Diagnosis not present

## 2014-05-12 DIAGNOSIS — Z882 Allergy status to sulfonamides status: Secondary | ICD-10-CM | POA: Diagnosis not present

## 2014-05-12 DIAGNOSIS — Z885 Allergy status to narcotic agent status: Secondary | ICD-10-CM | POA: Diagnosis not present

## 2014-05-12 DIAGNOSIS — Z91018 Allergy to other foods: Secondary | ICD-10-CM | POA: Diagnosis not present

## 2014-05-12 DIAGNOSIS — Z9079 Acquired absence of other genital organ(s): Secondary | ICD-10-CM | POA: Diagnosis not present

## 2014-05-12 DIAGNOSIS — J069 Acute upper respiratory infection, unspecified: Secondary | ICD-10-CM | POA: Diagnosis not present

## 2014-05-12 DIAGNOSIS — K219 Gastro-esophageal reflux disease without esophagitis: Secondary | ICD-10-CM | POA: Diagnosis not present

## 2014-05-12 DIAGNOSIS — Z8542 Personal history of malignant neoplasm of other parts of uterus: Secondary | ICD-10-CM | POA: Diagnosis not present

## 2014-05-12 DIAGNOSIS — D509 Iron deficiency anemia, unspecified: Secondary | ICD-10-CM | POA: Diagnosis not present

## 2014-05-12 DIAGNOSIS — R7309 Other abnormal glucose: Secondary | ICD-10-CM | POA: Diagnosis not present

## 2014-05-12 DIAGNOSIS — I10 Essential (primary) hypertension: Secondary | ICD-10-CM | POA: Diagnosis not present

## 2014-05-12 DIAGNOSIS — Z9089 Acquired absence of other organs: Secondary | ICD-10-CM | POA: Diagnosis not present

## 2014-05-12 DIAGNOSIS — K2270 Barrett's esophagus without dysplasia: Secondary | ICD-10-CM | POA: Diagnosis not present

## 2014-05-12 DIAGNOSIS — J45909 Unspecified asthma, uncomplicated: Secondary | ICD-10-CM | POA: Diagnosis not present

## 2014-05-12 DIAGNOSIS — Z90710 Acquired absence of both cervix and uterus: Secondary | ICD-10-CM | POA: Diagnosis not present

## 2014-05-12 DIAGNOSIS — Z87891 Personal history of nicotine dependence: Secondary | ICD-10-CM | POA: Diagnosis not present

## 2014-05-12 DIAGNOSIS — G35 Multiple sclerosis: Secondary | ICD-10-CM | POA: Diagnosis not present

## 2014-05-12 DIAGNOSIS — Z886 Allergy status to analgesic agent status: Secondary | ICD-10-CM | POA: Diagnosis not present

## 2014-05-12 DIAGNOSIS — I471 Supraventricular tachycardia: Secondary | ICD-10-CM | POA: Diagnosis not present

## 2014-05-12 DIAGNOSIS — Z880 Allergy status to penicillin: Secondary | ICD-10-CM | POA: Diagnosis not present

## 2014-05-12 DIAGNOSIS — Z883 Allergy status to other anti-infective agents status: Secondary | ICD-10-CM | POA: Diagnosis not present

## 2014-05-12 DIAGNOSIS — E269 Hyperaldosteronism, unspecified: Secondary | ICD-10-CM | POA: Diagnosis not present

## 2014-05-12 DIAGNOSIS — F411 Generalized anxiety disorder: Secondary | ICD-10-CM | POA: Diagnosis not present

## 2014-05-12 DIAGNOSIS — G8929 Other chronic pain: Secondary | ICD-10-CM | POA: Diagnosis not present

## 2014-05-12 DIAGNOSIS — E785 Hyperlipidemia, unspecified: Secondary | ICD-10-CM | POA: Diagnosis not present

## 2014-05-13 ENCOUNTER — Ambulatory Visit (INDEPENDENT_AMBULATORY_CARE_PROVIDER_SITE_OTHER): Payer: Medicare Other | Admitting: Family Medicine

## 2014-05-13 ENCOUNTER — Telehealth: Payer: Self-pay | Admitting: Family Medicine

## 2014-05-13 ENCOUNTER — Encounter: Payer: Self-pay | Admitting: Family Medicine

## 2014-05-13 VITALS — BP 137/85 | HR 90 | Wt 189.0 lb

## 2014-05-13 DIAGNOSIS — R1012 Left upper quadrant pain: Secondary | ICD-10-CM | POA: Diagnosis not present

## 2014-05-13 DIAGNOSIS — L603 Nail dystrophy: Secondary | ICD-10-CM

## 2014-05-13 DIAGNOSIS — F341 Dysthymic disorder: Secondary | ICD-10-CM

## 2014-05-13 DIAGNOSIS — L608 Other nail disorders: Secondary | ICD-10-CM | POA: Diagnosis not present

## 2014-05-13 DIAGNOSIS — J0100 Acute maxillary sinusitis, unspecified: Secondary | ICD-10-CM

## 2014-05-13 NOTE — Progress Notes (Signed)
   Subjective:    Patient ID: Jennifer [name], female    DOB: 1966-06-23, 48 y.o.   MRN: [id number]  Abdominal Pain   Her weight is up 3 lbs since last Friday 6 days ago.  She says her bowels have been moving some but had a hard time this AM.  She says she will take soething for her stools later today.  LUQ is some better.    Has tried Effexor but made her feel like ants on crawling on her.  zoloft worked well, but caused headaches. Paxil or Prozac caused HA as well.  buspar worked but made her fee like she was walking on clouds.   Has been having headaches for about 5 days and sinus pressure.  Still having a lot of mucous.  Went to the ED a few days ago about it.    Has 5 days that have some keratination.  She had all her toenails removed a few years ago. She went back to her podiatrist in [facility] and said thaere really wasn't anything he could do for her. he would rather not have any toenails.    Review of Systems  Gastrointestinal: Positive for abdominal pain.       Objective:   Physical Exam  Constitutional: She is oriented to person, place, and time. She appears well-developed and well-nourished.  HENT:  Head: Normocephalic and atraumatic.  Right Ear: External ear normal.  Left Ear: External ear normal.  Nose: Nose normal.  Mouth/Throat: Oropharynx is clear and moist.  TMs and canals are clear. Tender over the maxillary sinuses bilaterally.    Eyes: Conjunctivae and EOM are normal. Pupils are equal, round, and reactive to light.  Neck: Neck supple. No thyromegaly present.  Cardiovascular: Normal rate, regular rhythm and normal heart sounds.   Pulmonary/Chest: Effort normal and breath sounds normal. She has no wheezes.  Lymphadenopathy:    She has no cervical adenopathy.  Neurological: She is alert and oriented to person, place, and time.  Skin: Skin is warm and dry.  Psychiatric: She has a normal mood and affect.          Assessment & Plan:  Depression - B/c of  seizures will need to avoid Wellbutrin.  Effexor, zoloft, Prozac/or Paxil caused S.E.  Consider Citalpram vs older medication like amitryptiline that could also help with sleep, chronic HA, and maybe mood.  Will speak with Dr. Berdine Addison  Her neuro about choices.    LUQ pain - continue to work on moving bowels.  Maybe continued to take off her etc. to keep her bowels moving.    Nail dystrophy - will refer to podiatry here in [place].    Acute sinsusitis - recommend restart nasal rinses as this can help thin the secretions. Extraneous humidifier. Also okay to use as for up to 3 days for acute symptoms. Could consider restarting her nasal steroid but she feels that these really are very helpful for her. If she feels like it's at the point of needing an antibiotic for before the weekend and she can give me a call. She's not having any fever and has had symptoms for about 5 days.Marland Kitchen

## 2014-05-13 NOTE — Patient Instructions (Addendum)
Restart your saline rinses while you are sick.   Use your Affrin for the next 3 days.  Call me if you feel like we need to call in an antibiotic.

## 2014-05-13 NOTE — Telephone Encounter (Signed)
Please call Dr. Cathey Endow office. I would really like to start Malesha on an antidepressant. She has been intolerant to Effexor, Prozac, Paxil, sertraline, BuSpar. She got the most benefit from sertraline to cause headaches. I would like to consider a trial of citalopram or maybe even using an older medication like amitriptyline which could help with mood and her headaches and possibly with sleep. Please leave a note with his nurse to see if he has any suggestions or recommendations or feels like the above options are reasonable.

## 2014-05-14 DIAGNOSIS — R760 Raised antibody titer: Secondary | ICD-10-CM | POA: Diagnosis not present

## 2014-05-14 DIAGNOSIS — G35 Multiple sclerosis: Secondary | ICD-10-CM | POA: Diagnosis not present

## 2014-05-14 NOTE — Telephone Encounter (Signed)
I called Dr. Lorelee Market. Daneil Dolin, MD Neurologist Address: [address], [place], [place] [address] [phone] and left a detailed message on Dr [employer] nurse voicemail.

## 2014-05-15 DIAGNOSIS — J387 Other diseases of larynx: Secondary | ICD-10-CM | POA: Diagnosis not present

## 2014-05-15 DIAGNOSIS — R490 Dysphonia: Secondary | ICD-10-CM | POA: Diagnosis not present

## 2014-05-15 DIAGNOSIS — B370 Candidal stomatitis: Secondary | ICD-10-CM | POA: Diagnosis not present

## 2014-05-15 DIAGNOSIS — K210 Gastro-esophageal reflux disease with esophagitis: Secondary | ICD-10-CM | POA: Diagnosis not present

## 2014-05-15 NOTE — Telephone Encounter (Signed)
Aniceto Boss called from Dr Theodosia Blender office and advised to return her call. I called and left a message on her voicemail asking her to return my call. The first message left was detailed explaining what Dr Madilyn Fireman is needing.

## 2014-05-17 IMAGING — CR DG ABDOMEN ACUTE W/ 1V CHEST
4 series · 4 of 4 positions shown · non-contrast
Comparison: 01/20/2013 and 02/15/2013

CLINICAL DATA: Abdominal pain.  Nausea.  Presyncope.  Orthostatic.

ACUTE ABDOMEN SERIES (ABDOMEN 2 VIEW & CHEST 1 VIEW)

[w chest pa]
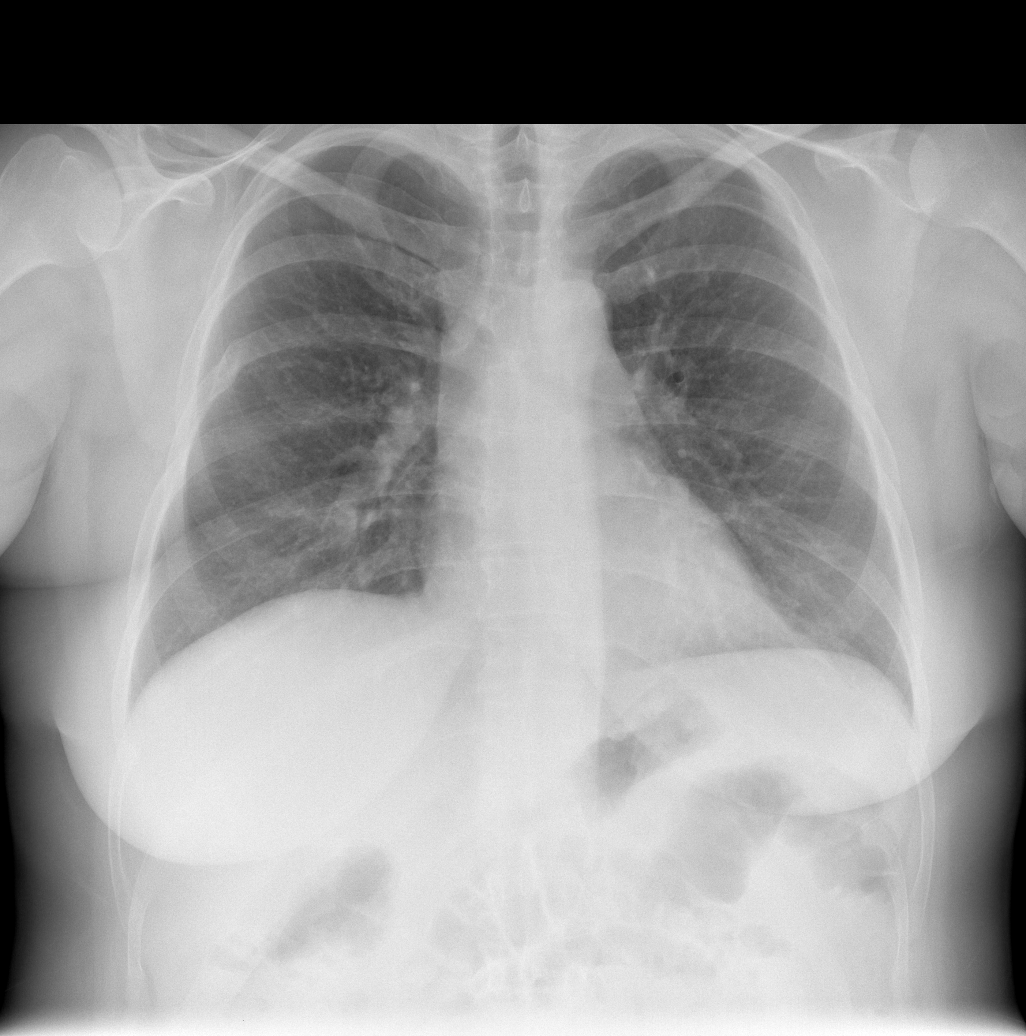

[w abdomen upright]
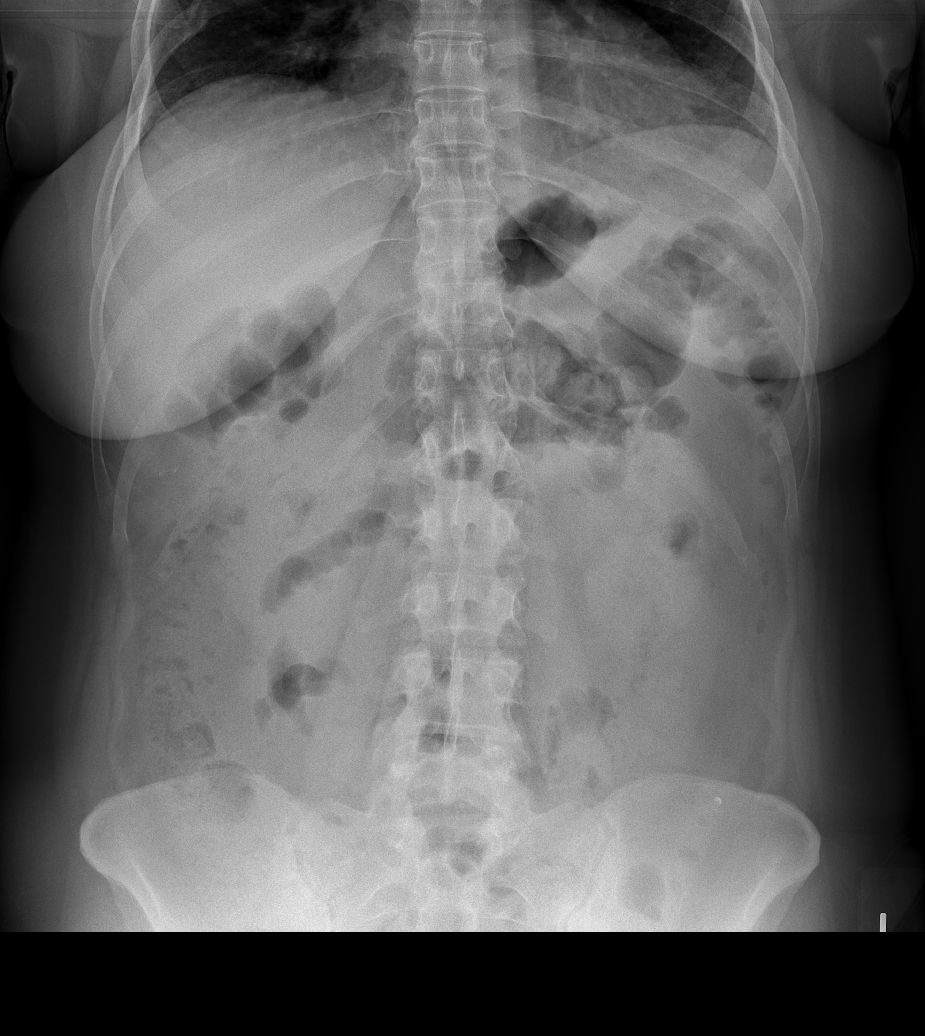

[t abdomen supine (1 of 2)]
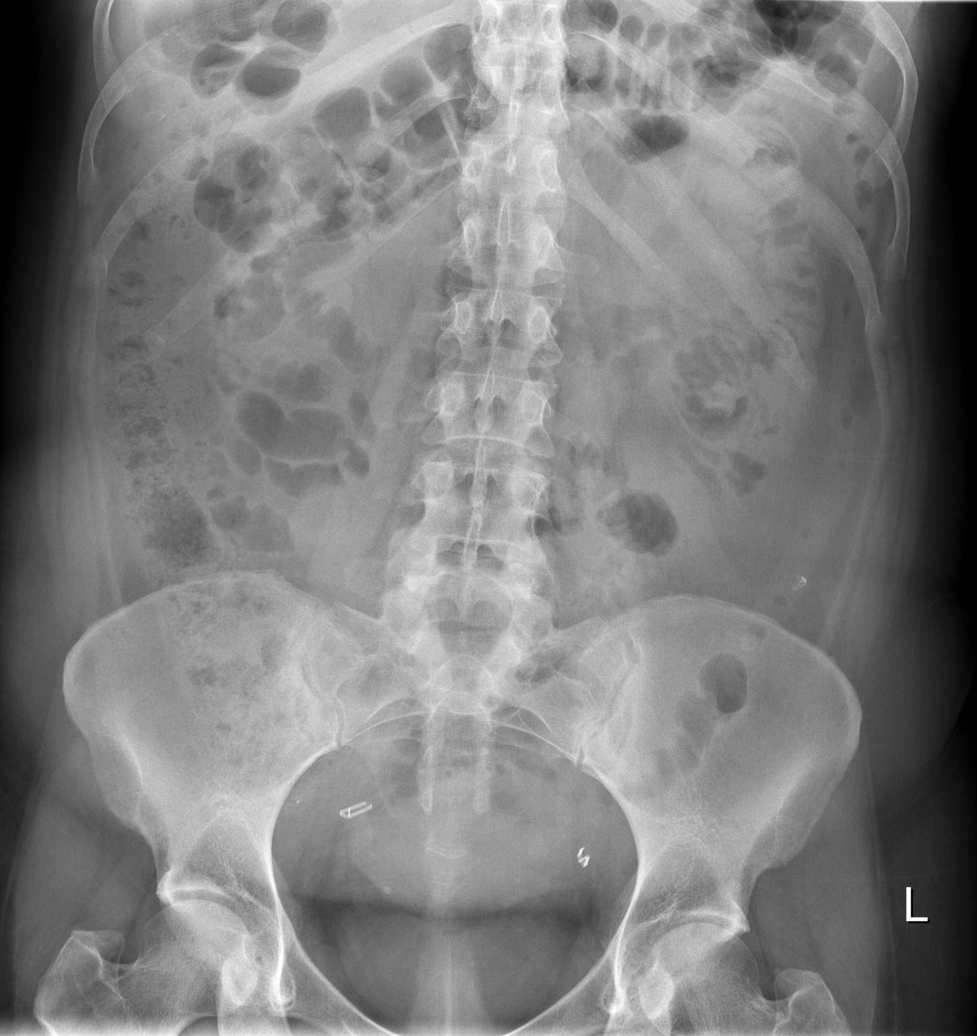

[t abdomen supine (2 of 2)]
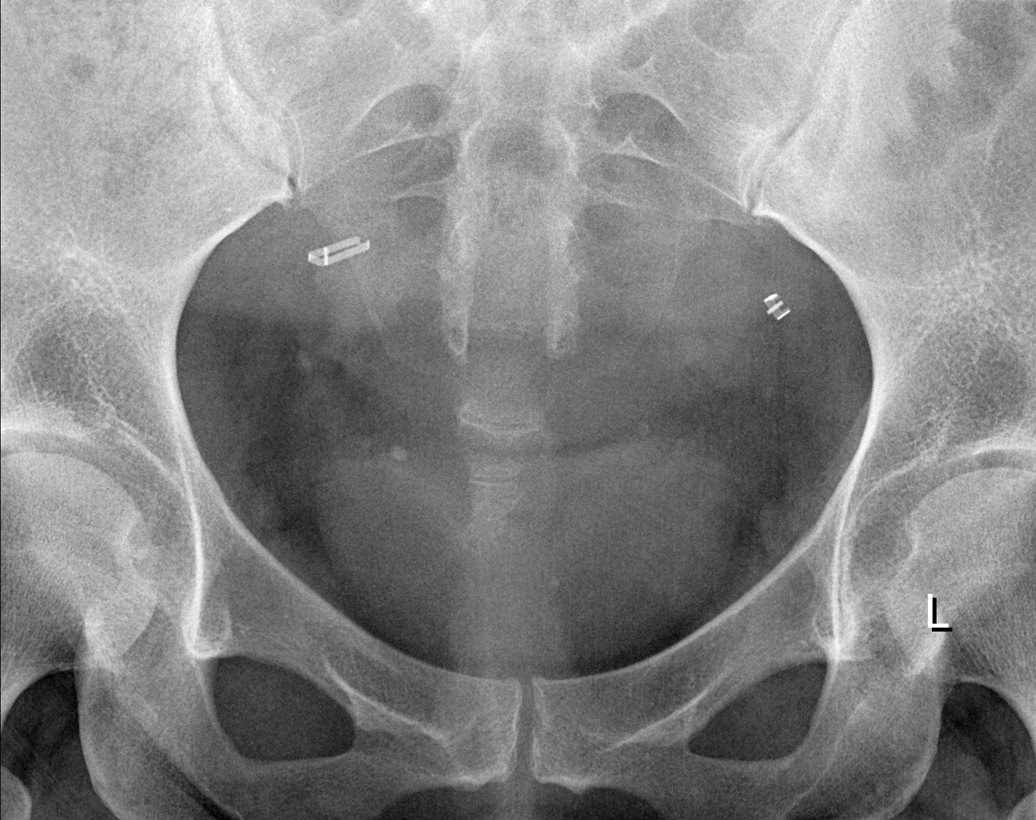

[4 of 4 positions shown; findings below may reference images not displayed]

FINDINGS: Multiple gas filled nondilated small bowel loops are
seen.  Gas is also seen throughout the majority the colon.  No
evidence of dilated bowel loops or free air.  Pelvic clips are seen
from previous bilateral tubal ligation.

The heart size and mediastinal contours are normal.  Both lungs are
clear.  Old fracture deformity of the right posterolateral 6th rib
is noted.
IMPRESSION: 1.  Nonspecific, nonobstructive bowel gas pattern.
2.  No active cardiopulmonary disease.

## 2014-05-20 ENCOUNTER — Ambulatory Visit: Payer: Medicare Other | Admitting: Family Medicine

## 2014-05-20 DIAGNOSIS — C4499 Other specified malignant neoplasm of skin, unspecified: Secondary | ICD-10-CM | POA: Diagnosis not present

## 2014-05-20 DIAGNOSIS — R51 Headache: Secondary | ICD-10-CM | POA: Diagnosis not present

## 2014-05-20 DIAGNOSIS — C541 Malignant neoplasm of endometrium: Secondary | ICD-10-CM | POA: Diagnosis not present

## 2014-05-20 DIAGNOSIS — M542 Cervicalgia: Secondary | ICD-10-CM | POA: Diagnosis not present

## 2014-05-20 NOTE — Telephone Encounter (Signed)
Left another detailed message.

## 2014-05-20 NOTE — Telephone Encounter (Signed)
Have they called back?

## 2014-05-20 NOTE — Telephone Encounter (Signed)
Jennifer [name] from Dr [facility] office called and advised amitriptyline 10 or 25 mg to help with mood and her headaches and possibly with sleep. He advises to start 1 tablet at bedtime for 7 days then 2 tablets at bedtime for 7 days. If this dosing works she can stay at the 2 tablets at bedtime. If she is still having symptoms then increase to 3 tablets at bedtime.

## 2014-05-21 MED ORDER — AMITRIPTYLINE HCL 25 MG PO TABS: ORAL_TABLET | ORAL | Status: DC

## 2014-05-21 NOTE — Telephone Encounter (Signed)
Rx sent to [facility]. Keep f/u with me.

## 2014-05-21 NOTE — Telephone Encounter (Signed)
Pt called and wanted to know if Dr. Madilyn Fireman heard anything from Dr. Cathey Endow office. I told her that we have and what he recommended. I told her that Dr. Madilyn Fireman has not seen the note from Dr. Cathey Endow ofc and that she is not her today or tomorrow however she may check her basket and send in this med for her. I advised that she call the pharmacy 1st. Pt voiced understanding and agreed.[name] [place]

## 2014-05-22 DIAGNOSIS — R05 Cough: Secondary | ICD-10-CM | POA: Diagnosis not present

## 2014-05-22 DIAGNOSIS — E876 Hypokalemia: Secondary | ICD-10-CM | POA: Diagnosis not present

## 2014-05-22 DIAGNOSIS — R002 Palpitations: Secondary | ICD-10-CM | POA: Diagnosis not present

## 2014-05-22 DIAGNOSIS — G35 Multiple sclerosis: Secondary | ICD-10-CM | POA: Diagnosis not present

## 2014-05-22 DIAGNOSIS — E162 Hypoglycemia, unspecified: Secondary | ICD-10-CM | POA: Diagnosis not present

## 2014-05-22 DIAGNOSIS — I10 Essential (primary) hypertension: Secondary | ICD-10-CM | POA: Diagnosis not present

## 2014-05-22 DIAGNOSIS — E269 Hyperaldosteronism, unspecified: Secondary | ICD-10-CM | POA: Diagnosis not present

## 2014-05-22 DIAGNOSIS — R079 Chest pain, unspecified: Secondary | ICD-10-CM | POA: Diagnosis not present

## 2014-05-22 DIAGNOSIS — G8929 Other chronic pain: Secondary | ICD-10-CM | POA: Diagnosis not present

## 2014-05-25 DIAGNOSIS — R002 Palpitations: Secondary | ICD-10-CM | POA: Diagnosis not present

## 2014-05-25 DIAGNOSIS — F331 Major depressive disorder, recurrent, moderate: Secondary | ICD-10-CM | POA: Diagnosis not present

## 2014-05-25 DIAGNOSIS — G35 Multiple sclerosis: Secondary | ICD-10-CM | POA: Diagnosis not present

## 2014-05-25 DIAGNOSIS — R5383 Other fatigue: Secondary | ICD-10-CM | POA: Diagnosis not present

## 2014-05-25 NOTE — Telephone Encounter (Signed)
lvm informing pt to keep f/u appt with Dr. Madilyn Fireman.[name] [place]

## 2014-05-28 ENCOUNTER — Ambulatory Visit (INDEPENDENT_AMBULATORY_CARE_PROVIDER_SITE_OTHER): Payer: Medicare Other | Admitting: Family Medicine

## 2014-05-28 ENCOUNTER — Encounter: Payer: Self-pay | Admitting: Family Medicine

## 2014-05-28 VITALS — BP 141/86 | HR 101 | Wt 189.0 lb

## 2014-05-28 DIAGNOSIS — F418 Other specified anxiety disorders: Secondary | ICD-10-CM | POA: Diagnosis not present

## 2014-05-28 DIAGNOSIS — I10 Essential (primary) hypertension: Secondary | ICD-10-CM | POA: Diagnosis not present

## 2014-05-28 DIAGNOSIS — R002 Palpitations: Secondary | ICD-10-CM | POA: Diagnosis not present

## 2014-05-28 DIAGNOSIS — M5020 Other cervical disc displacement, unspecified cervical region: Secondary | ICD-10-CM | POA: Diagnosis not present

## 2014-05-28 DIAGNOSIS — M79604 Pain in right leg: Secondary | ICD-10-CM

## 2014-05-28 DIAGNOSIS — F329 Major depressive disorder, single episode, unspecified: Secondary | ICD-10-CM

## 2014-05-28 DIAGNOSIS — F419 Anxiety disorder, unspecified: Secondary | ICD-10-CM

## 2014-05-28 DIAGNOSIS — M79605 Pain in left leg: Secondary | ICD-10-CM

## 2014-05-28 DIAGNOSIS — F32A Depression, unspecified: Secondary | ICD-10-CM

## 2014-05-28 DIAGNOSIS — J029 Acute pharyngitis, unspecified: Secondary | ICD-10-CM

## 2014-05-28 MED ORDER — MUPIROCIN 2 % EX OINT: TOPICAL_OINTMENT | CUTANEOUS | Status: DC
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
## 2014-05-29 DIAGNOSIS — R1084 Generalized abdominal pain: Secondary | ICD-10-CM | POA: Diagnosis not present

## 2014-05-29 DIAGNOSIS — R1110 Vomiting, unspecified: Secondary | ICD-10-CM | POA: Diagnosis not present

## 2014-05-29 DIAGNOSIS — F4310 Post-traumatic stress disorder, unspecified: Secondary | ICD-10-CM | POA: Diagnosis not present

## 2014-05-29 DIAGNOSIS — T50995A Adverse effect of other drugs, medicaments and biological substances, initial encounter: Secondary | ICD-10-CM | POA: Diagnosis not present

## 2014-05-29 DIAGNOSIS — Z9889 Other specified postprocedural states: Secondary | ICD-10-CM | POA: Diagnosis not present

## 2014-05-29 DIAGNOSIS — R112 Nausea with vomiting, unspecified: Secondary | ICD-10-CM | POA: Diagnosis not present

## 2014-05-29 DIAGNOSIS — L299 Pruritus, unspecified: Secondary | ICD-10-CM | POA: Diagnosis not present

## 2014-05-29 DIAGNOSIS — R140 Abdominal distension (gaseous): Secondary | ICD-10-CM | POA: Diagnosis not present

## 2014-05-29 DIAGNOSIS — R109 Unspecified abdominal pain: Secondary | ICD-10-CM | POA: Diagnosis not present

## 2014-05-30 DIAGNOSIS — Z91018 Allergy to other foods: Secondary | ICD-10-CM | POA: Diagnosis not present

## 2014-05-30 DIAGNOSIS — Z882 Allergy status to sulfonamides status: Secondary | ICD-10-CM | POA: Diagnosis not present

## 2014-05-30 DIAGNOSIS — Z79899 Other long term (current) drug therapy: Secondary | ICD-10-CM | POA: Diagnosis not present

## 2014-05-30 DIAGNOSIS — G8929 Other chronic pain: Secondary | ICD-10-CM | POA: Diagnosis not present

## 2014-05-30 DIAGNOSIS — Z8542 Personal history of malignant neoplasm of other parts of uterus: Secondary | ICD-10-CM | POA: Diagnosis not present

## 2014-05-30 DIAGNOSIS — G35 Multiple sclerosis: Secondary | ICD-10-CM | POA: Diagnosis not present

## 2014-05-30 DIAGNOSIS — Z880 Allergy status to penicillin: Secondary | ICD-10-CM | POA: Diagnosis not present

## 2014-05-30 DIAGNOSIS — Z87891 Personal history of nicotine dependence: Secondary | ICD-10-CM | POA: Diagnosis not present

## 2014-05-30 DIAGNOSIS — L299 Pruritus, unspecified: Secondary | ICD-10-CM | POA: Diagnosis not present

## 2014-05-30 DIAGNOSIS — E785 Hyperlipidemia, unspecified: Secondary | ICD-10-CM | POA: Diagnosis not present

## 2014-05-30 DIAGNOSIS — T50995A Adverse effect of other drugs, medicaments and biological substances, initial encounter: Secondary | ICD-10-CM | POA: Diagnosis not present

## 2014-05-30 DIAGNOSIS — Z883 Allergy status to other anti-infective agents status: Secondary | ICD-10-CM | POA: Diagnosis not present

## 2014-05-30 DIAGNOSIS — Z886 Allergy status to analgesic agent status: Secondary | ICD-10-CM | POA: Diagnosis not present

## 2014-05-30 DIAGNOSIS — Z888 Allergy status to other drugs, medicaments and biological substances status: Secondary | ICD-10-CM | POA: Diagnosis not present

## 2014-05-30 DIAGNOSIS — I10 Essential (primary) hypertension: Secondary | ICD-10-CM | POA: Diagnosis not present

## 2014-05-30 DIAGNOSIS — T461X5A Adverse effect of calcium-channel blockers, initial encounter: Secondary | ICD-10-CM | POA: Diagnosis not present

## 2014-05-30 DIAGNOSIS — K219 Gastro-esophageal reflux disease without esophagitis: Secondary | ICD-10-CM | POA: Diagnosis not present

## 2014-06-01 ENCOUNTER — Telehealth: Payer: Self-pay | Admitting: *Deleted

## 2014-06-01 ENCOUNTER — Telehealth: Payer: Self-pay

## 2014-06-01 NOTE — Telephone Encounter (Signed)
Patient called and left a message stating she was seen in the ED for lip and face swelling. She was taken off the Norvasc. She wanted to know if there was another blood pressure medication she could take.

## 2014-06-01 NOTE — Telephone Encounter (Signed)
No prior auth for ABI's patient has Jennifer [name], CMA

## 2014-06-02 ENCOUNTER — Emergency Department (HOSPITAL_BASED_OUTPATIENT_CLINIC_OR_DEPARTMENT_OTHER)
Admission: EM | Admit: 2014-06-02 | Discharge: 2014-06-02 | Disposition: A | Discharge status: Home / Self Care | Payer: Medicare Other | Attending: Emergency Medicine | Admitting: Emergency Medicine

## 2014-06-02 ENCOUNTER — Telehealth: Payer: Self-pay | Admitting: *Deleted

## 2014-06-02 ENCOUNTER — Encounter (HOSPITAL_BASED_OUTPATIENT_CLINIC_OR_DEPARTMENT_OTHER): Payer: Self-pay | Admitting: Emergency Medicine

## 2014-06-02 DIAGNOSIS — F4310 Post-traumatic stress disorder, unspecified: Secondary | ICD-10-CM | POA: Diagnosis not present

## 2014-06-02 DIAGNOSIS — Z9981 Dependence on supplemental oxygen: Secondary | ICD-10-CM | POA: Diagnosis not present

## 2014-06-02 DIAGNOSIS — K219 Gastro-esophageal reflux disease without esophagitis: Secondary | ICD-10-CM | POA: Insufficient documentation

## 2014-06-02 DIAGNOSIS — Z8541 Personal history of malignant neoplasm of cervix uteri: Secondary | ICD-10-CM | POA: Insufficient documentation

## 2014-06-02 DIAGNOSIS — Z862 Personal history of diseases of the blood and blood-forming organs and certain disorders involving the immune mechanism: Secondary | ICD-10-CM | POA: Insufficient documentation

## 2014-06-02 DIAGNOSIS — Z85828 Personal history of other malignant neoplasm of skin: Secondary | ICD-10-CM | POA: Insufficient documentation

## 2014-06-02 DIAGNOSIS — R519 Headache, unspecified: Secondary | ICD-10-CM

## 2014-06-02 DIAGNOSIS — J45909 Unspecified asthma, uncomplicated: Secondary | ICD-10-CM | POA: Diagnosis not present

## 2014-06-02 DIAGNOSIS — G4730 Sleep apnea, unspecified: Secondary | ICD-10-CM | POA: Insufficient documentation

## 2014-06-02 DIAGNOSIS — Z79899 Other long term (current) drug therapy: Secondary | ICD-10-CM | POA: Diagnosis not present

## 2014-06-02 DIAGNOSIS — Z8614 Personal history of Methicillin resistant Staphylococcus aureus infection: Secondary | ICD-10-CM | POA: Insufficient documentation

## 2014-06-02 DIAGNOSIS — Z792 Long term (current) use of antibiotics: Secondary | ICD-10-CM | POA: Insufficient documentation

## 2014-06-02 DIAGNOSIS — R51 Headache: Secondary | ICD-10-CM | POA: Insufficient documentation

## 2014-06-02 DIAGNOSIS — Z91410 Personal history of adult physical and sexual abuse: Secondary | ICD-10-CM | POA: Diagnosis not present

## 2014-06-02 DIAGNOSIS — G8929 Other chronic pain: Secondary | ICD-10-CM | POA: Insufficient documentation

## 2014-06-02 DIAGNOSIS — Z880 Allergy status to penicillin: Secondary | ICD-10-CM | POA: Insufficient documentation

## 2014-06-02 DIAGNOSIS — I10 Essential (primary) hypertension: Secondary | ICD-10-CM | POA: Insufficient documentation

## 2014-06-02 DIAGNOSIS — Z87891 Personal history of nicotine dependence: Secondary | ICD-10-CM | POA: Diagnosis not present

## 2014-06-02 MED ORDER — ACETAMINOPHEN 325 MG PO TABS
650.0000 mg | ORAL_TABLET | Freq: Once | ORAL | Status: AC
  Administered 2014-06-02: 650 mg via ORAL
  Filled 2014-06-02: qty 2

## 2014-06-02 MED ORDER — ISOMETHEPTENE-APAP-DICHLORAL 65-325-100 MG PO CAPS
1.0000 | ORAL_CAPSULE | ORAL | Status: DC
  Filled 2014-06-02: qty 1

## 2014-06-02 MED ORDER — SODIUM CHLORIDE 0.9 % IV BOLUS (SEPSIS)
1000.0000 mL | Freq: Once | INTRAVENOUS | Status: AC
  Administered 2014-06-02: 1000 mL via INTRAVENOUS

## 2014-06-02 MED ORDER — ONDANSETRON HCL 4 MG/2ML IJ SOLN
4.0000 mg | Freq: Once | INTRAMUSCULAR | Status: AC
  Administered 2014-06-02: 4 mg via INTRAVENOUS
  Filled 2014-06-02: qty 2
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
## 2014-06-02 NOTE — Telephone Encounter (Signed)
Patient advised and she will start with a half tablet of the 20 mg Micardis. She is also going to start decreasing her milk products.

## 2014-06-02 NOTE — ED Notes (Signed)
Patient states she has a two or more week history of a headache and has been seen by her Neurologist.  States one week ago she was started on a new blood pressure medication, which caused an allergic reaction.  Was restarted on mycardis today.  In addition, she has pain in right leg in the calf and knee after hitting it three weeks ago.  Denies vision changes, nausea or vomiting.

## 2014-06-02 NOTE — ED Notes (Signed)
Attempted IV access unsuccessful x 2

## 2014-06-02 NOTE — ED Notes (Signed)
Midrin not available. NP aware.

## 2014-06-02 NOTE — Telephone Encounter (Signed)
We cannot try going back to the Micardis. And working on slightly increasing the dose until it is therapeutic.

## 2014-06-02 NOTE — Telephone Encounter (Signed)
Pt called and stated that she has taken her BP meds today however she is experiencing a Headache that goes from temple to temple and about 1/4" into her hairline. She has been doing as Dr. Madilyn Fireman instructed her to do and this doesn't seem to help much.  She does admit to feeling some better than she did at her OVPlease advise.Jennifer [name] [place]

## 2014-06-02 NOTE — Discharge Instructions (Signed)
Please follow the directions provided. Be sure to call your primary care provider or your neurologist to make an appointment for follow up regarding your headache. You may take Tylenol 650 mg by mouth every 4 hours or ibuprofen 400 mg by mouth every 6 hours for pain. Do not hesitate to return for new worsening or concerning symptoms.  SEEK IMMEDIATE MEDICAL CARE IF:  Your headache becomes severe.  You have a fever.  You have a stiff neck.  You have loss of vision.  You have muscular weakness or loss of muscle control.  You start losing your balance or have trouble walking.  You feel faint or pass out.  You have severe symptoms that are different from your first symptoms.

## 2014-06-03 ENCOUNTER — Telehealth: Payer: Self-pay | Admitting: *Deleted

## 2014-06-03 ENCOUNTER — Other Ambulatory Visit: Payer: Self-pay | Admitting: *Deleted

## 2014-06-03 DIAGNOSIS — R7309 Other abnormal glucose: Secondary | ICD-10-CM | POA: Diagnosis not present

## 2014-06-03 DIAGNOSIS — Z888 Allergy status to other drugs, medicaments and biological substances status: Secondary | ICD-10-CM | POA: Diagnosis not present

## 2014-06-03 DIAGNOSIS — Z79899 Other long term (current) drug therapy: Secondary | ICD-10-CM | POA: Diagnosis not present

## 2014-06-03 DIAGNOSIS — Z882 Allergy status to sulfonamides status: Secondary | ICD-10-CM | POA: Diagnosis not present

## 2014-06-03 DIAGNOSIS — M549 Dorsalgia, unspecified: Secondary | ICD-10-CM | POA: Diagnosis not present

## 2014-06-03 DIAGNOSIS — D509 Iron deficiency anemia, unspecified: Secondary | ICD-10-CM | POA: Diagnosis not present

## 2014-06-03 DIAGNOSIS — Z881 Allergy status to other antibiotic agents status: Secondary | ICD-10-CM | POA: Diagnosis not present

## 2014-06-03 DIAGNOSIS — C519 Malignant neoplasm of vulva, unspecified: Secondary | ICD-10-CM | POA: Diagnosis not present

## 2014-06-03 DIAGNOSIS — R569 Unspecified convulsions: Secondary | ICD-10-CM | POA: Diagnosis not present

## 2014-06-03 DIAGNOSIS — F419 Anxiety disorder, unspecified: Secondary | ICD-10-CM | POA: Diagnosis not present

## 2014-06-03 DIAGNOSIS — I471 Supraventricular tachycardia: Secondary | ICD-10-CM | POA: Diagnosis not present

## 2014-06-03 DIAGNOSIS — I10 Essential (primary) hypertension: Secondary | ICD-10-CM | POA: Diagnosis not present

## 2014-06-03 DIAGNOSIS — R51 Headache: Secondary | ICD-10-CM | POA: Diagnosis not present

## 2014-06-03 DIAGNOSIS — G8929 Other chronic pain: Secondary | ICD-10-CM | POA: Diagnosis not present

## 2014-06-03 DIAGNOSIS — R4182 Altered mental status, unspecified: Secondary | ICD-10-CM | POA: Diagnosis not present

## 2014-06-03 DIAGNOSIS — J45909 Unspecified asthma, uncomplicated: Secondary | ICD-10-CM | POA: Diagnosis not present

## 2014-06-03 DIAGNOSIS — Z8542 Personal history of malignant neoplasm of other parts of uterus: Secondary | ICD-10-CM | POA: Diagnosis not present

## 2014-06-03 DIAGNOSIS — Z886 Allergy status to analgesic agent status: Secondary | ICD-10-CM | POA: Diagnosis not present

## 2014-06-03 DIAGNOSIS — Z885 Allergy status to narcotic agent status: Secondary | ICD-10-CM | POA: Diagnosis not present

## 2014-06-03 DIAGNOSIS — E2609 Other primary hyperaldosteronism: Secondary | ICD-10-CM | POA: Diagnosis not present

## 2014-06-03 DIAGNOSIS — R140 Abdominal distension (gaseous): Secondary | ICD-10-CM | POA: Diagnosis not present

## 2014-06-03 DIAGNOSIS — F17201 Nicotine dependence, unspecified, in remission: Secondary | ICD-10-CM | POA: Diagnosis not present

## 2014-06-03 DIAGNOSIS — R202 Paresthesia of skin: Secondary | ICD-10-CM | POA: Diagnosis not present

## 2014-06-03 DIAGNOSIS — Z880 Allergy status to penicillin: Secondary | ICD-10-CM | POA: Diagnosis not present

## 2014-06-03 DIAGNOSIS — K2270 Barrett's esophagus without dysplasia: Secondary | ICD-10-CM | POA: Diagnosis not present

## 2014-06-03 DIAGNOSIS — Z8589 Personal history of malignant neoplasm of other organs and systems: Secondary | ICD-10-CM | POA: Diagnosis not present

## 2014-06-03 DIAGNOSIS — K219 Gastro-esophageal reflux disease without esophagitis: Secondary | ICD-10-CM | POA: Diagnosis not present

## 2014-06-03 DIAGNOSIS — R109 Unspecified abdominal pain: Secondary | ICD-10-CM | POA: Diagnosis not present

## 2014-06-03 DIAGNOSIS — G35 Multiple sclerosis: Secondary | ICD-10-CM | POA: Diagnosis not present

## 2014-06-03 DIAGNOSIS — E785 Hyperlipidemia, unspecified: Secondary | ICD-10-CM | POA: Diagnosis not present

## 2014-06-03 NOTE — Telephone Encounter (Signed)
I just don't think this is from the blood pressure pill.  Headache is very rare.  Usually HA if from when BP is high. Make sure hydrateing well. What is her BP doing?

## 2014-06-03 NOTE — Telephone Encounter (Signed)
Jennifer [name] was in the UC for intractable headache yesterday that she said she has had for 24 hrs. She said it is relentless. She has taken ibuprofen and tylenol and the UC wanted to give her meds she was allergic too they told her they could dilute it and she refused. She said that it is temple to temple, around her entire head and down the back of her neck. She said that heat does not help. Her BP yesterday was 178/105 and the headache was present and it was low in the office Monday and the headache was present. Please advise. [name], CMA

## 2014-06-03 NOTE — Telephone Encounter (Signed)
After speaking with you your suggestion was to have her take another dose of micardis and hctz 25mg . She took her bp while we were on the phone and it was 166/110 and she had not taken any bp medications yet today. She said that she doesn't take it until noon. I told her that she needed to take her meds now and the hctz.. Give it about an hour or so to feel better and if she wasn't then she could a) go to ER and be evaluated or b) come to office tomorrow for a nurse/bp eval. She said that was completely worked up and was tired of being at hospital all the time and yesterday she was having drooping of her eye and left arm pain and nothing was done at the UC. I then called her back after talking with you again and told her that she needed to take an entire dose of micardis and hctz 25mg . She said that she had taken a dose of meds with orange juice but her head was hurting worse and was going to go to the ER for evaluation. Her lips were also numb at this time.

## 2014-06-04 ENCOUNTER — Other Ambulatory Visit: Payer: Self-pay | Admitting: Family Medicine

## 2014-06-04 ENCOUNTER — Telehealth: Payer: Self-pay | Admitting: *Deleted

## 2014-06-04 NOTE — Telephone Encounter (Signed)
Call made to [facility] and I left voicemail regarding her headache and msg re: ABI scheduled 10/26 @ 9am. Awaiting call back. [name], CMA

## 2014-06-04 NOTE — Telephone Encounter (Signed)
If HA persists can come in for toradol injection.

## 2014-06-04 NOTE — Telephone Encounter (Signed)
Ryna returned call and said that she only took 1 fiorcet and that her heart was racing. She wants to be seen but cannot be seen until tomorrow and I was going to put her on Dr. Lajoyce Lauber schedule if this was okay with you. Please advise. [name], CMA

## 2014-06-04 NOTE — Telephone Encounter (Signed)
Jennifer [name] called and said that she was at the ER and they gave her a script for fiorecet. I told her that she needed to try this rx. The direction were 1-2 pills q6hrs as needed for pain. She was worried about the caffeine and I told her that it was less than a cup of coffee and she needed the caffeine to dialate her vessels to relieve this headache since her BP was currently stable. She was very resistant to the idea but I also told her to drink some gatorade and take those pills and I would follow up with her around 1pm. Please advise what you would like to do if this headache continues? [name], CMA

## 2014-06-05 ENCOUNTER — Ambulatory Visit (INDEPENDENT_AMBULATORY_CARE_PROVIDER_SITE_OTHER): Payer: Medicare Other | Admitting: Family Medicine

## 2014-06-05 VITALS — BP 146/81 | HR 103 | Temp 98.4°F | Ht 62.0 in | Wt 189.0 lb

## 2014-06-05 DIAGNOSIS — R519 Headache, unspecified: Secondary | ICD-10-CM

## 2014-06-05 DIAGNOSIS — R0789 Other chest pain: Secondary | ICD-10-CM

## 2014-06-05 DIAGNOSIS — R51 Headache: Secondary | ICD-10-CM | POA: Diagnosis not present

## 2014-06-05 DIAGNOSIS — J0111 Acute recurrent frontal sinusitis: Secondary | ICD-10-CM | POA: Diagnosis not present

## 2014-06-05 MED ORDER — MUPIROCIN 2 % EX OINT: TOPICAL_OINTMENT | CUTANEOUS | Status: DC

## 2014-06-05 MED ORDER — CLINDAMYCIN HCL 300 MG PO CAPS: 300.0000 mg | ORAL_CAPSULE | Freq: Two times a day (BID) | ORAL | Status: DC

## 2014-06-05 NOTE — Telephone Encounter (Signed)
She is coming in on the nurse schedule this afternoon. [name], RMA

## 2014-06-05 NOTE — Progress Notes (Signed)
   Subjective:    Patient ID: Jennifer [name], female    DOB: 10/22/1965, 48 y.o.   MRN: [id number]  HPI  Jennifer [name] is here to follow up blood pressure. Today her pressure is 146/81.   She is having headaches around her forehead, body aches, nausea, runny nose with green discharge and a tinge of blood, shortness of breath (mild) and pressure in chest for a few days. Denies sore throat, fever, ear pain or vomiting. She has mentioned wanting to take Midrin for her headache.    Review of Systems     Objective:   Physical Exam        Assessment & Plan:

## 2014-06-05 NOTE — Telephone Encounter (Signed)
OK to see [name]

## 2014-06-05 NOTE — Progress Notes (Signed)
   Subjective:    Patient ID: Jennifer [name], female    DOB: June 13, 1966, 48 y.o.   MRN: [id number]  Headache  Her past medical history is significant for hypertension.  Hypertension Associated symptoms include headaches.    3 days of cough with white sputums. Says her chest feels tight.  Has had some green nasal d/c with blood from teh right nostril. Has a HA at the hairline. + post nasal drip. No wheezing. Her voice has been hoarse today Cough is worse in the AM and evening.. she is fearful that the chest tightness is caused by the Micardis. She was on amlodipine about a week ago in which the MRSA department because she felt like the left side of her face was swelling and she noticed a blister on her lip. She was worried it was coming from the medication so she stopped it.  Review of Systems  Neurological: Positive for headaches.       Objective:   Physical Exam  Constitutional: She is oriented to person, place, and time. She appears well-developed and well-nourished.  HENT:  Head: Normocephalic and atraumatic.  Right Ear: External ear normal.  Left Ear: External ear normal.  Nose: Nose normal.  Mouth/Throat: Oropharynx is clear and moist.  TMs and canals are clear.   Eyes: Conjunctivae and EOM are normal. Pupils are equal, round, and reactive to light.  Neck: Neck supple. No thyromegaly present.  Cardiovascular: Normal rate, regular rhythm and normal heart sounds.   Pulmonary/Chest: Effort normal and breath sounds normal. She has no wheezes.  Lymphadenopathy:    She has no cervical adenopathy.  Neurological: She is alert and oriented to person, place, and time.  Skin: Skin is warm and dry.  Psychiatric: She has a normal mood and affect.          Assessment & Plan:  Acute sinusitis - at this point she's only had symptoms for 3-4 days that she's had persistent congestion and mild cough for couple of weeks. Her symptoms as have been worse over the last 3-4 days. Will treat  with clindamycin as she does tolerate this ok.. he gave her reassurance that I do not think that the chest tightness is related at all to her Micardis. I really think it related to her acute respiratory infection. Encouraged her to stick with the Micardis for the weekend and start taking antibiotic and see if she feels better.  Headaches - Likely from sinuses and elevted BP.  Her pain is a 5/10 today which is better than yesterday when it was a 7/10. Her blood pressure has also come down a little bit. It was in the 160s yesterday. HTN- taking micardis and doing ok. Gave her reasssurance that her chest tightness is not related .    Chest tightness-I. think this is related to the respiratory infection and not the Micardis. Encouraged her to stick with it for a little while.  Time spent 30 minutes with greater than 50% with counseling about sinus, headaches, and chest tightness.

## 2014-06-06 ENCOUNTER — Encounter (HOSPITAL_BASED_OUTPATIENT_CLINIC_OR_DEPARTMENT_OTHER): Payer: Self-pay | Admitting: Emergency Medicine

## 2014-06-06 ENCOUNTER — Emergency Department (HOSPITAL_BASED_OUTPATIENT_CLINIC_OR_DEPARTMENT_OTHER)
Admission: EM | Admit: 2014-06-06 | Discharge: 2014-06-06 | Disposition: A | Discharge status: Home / Self Care | Payer: Medicare Other | Attending: Emergency Medicine | Admitting: Emergency Medicine

## 2014-06-06 ENCOUNTER — Emergency Department (HOSPITAL_BASED_OUTPATIENT_CLINIC_OR_DEPARTMENT_OTHER): Payer: Medicare Other

## 2014-06-06 DIAGNOSIS — Z8614 Personal history of Methicillin resistant Staphylococcus aureus infection: Secondary | ICD-10-CM | POA: Insufficient documentation

## 2014-06-06 DIAGNOSIS — J069 Acute upper respiratory infection, unspecified: Secondary | ICD-10-CM | POA: Diagnosis not present

## 2014-06-06 DIAGNOSIS — Z880 Allergy status to penicillin: Secondary | ICD-10-CM | POA: Insufficient documentation

## 2014-06-06 DIAGNOSIS — I10 Essential (primary) hypertension: Secondary | ICD-10-CM | POA: Diagnosis not present

## 2014-06-06 DIAGNOSIS — Z79899 Other long term (current) drug therapy: Secondary | ICD-10-CM | POA: Diagnosis not present

## 2014-06-06 DIAGNOSIS — Z8541 Personal history of malignant neoplasm of cervix uteri: Secondary | ICD-10-CM | POA: Insufficient documentation

## 2014-06-06 DIAGNOSIS — G8929 Other chronic pain: Secondary | ICD-10-CM | POA: Diagnosis not present

## 2014-06-06 DIAGNOSIS — F4310 Post-traumatic stress disorder, unspecified: Secondary | ICD-10-CM | POA: Insufficient documentation

## 2014-06-06 DIAGNOSIS — R000 Tachycardia, unspecified: Secondary | ICD-10-CM | POA: Insufficient documentation

## 2014-06-06 DIAGNOSIS — Z862 Personal history of diseases of the blood and blood-forming organs and certain disorders involving the immune mechanism: Secondary | ICD-10-CM | POA: Diagnosis not present

## 2014-06-06 DIAGNOSIS — R51 Headache: Secondary | ICD-10-CM | POA: Diagnosis not present

## 2014-06-06 DIAGNOSIS — Z8669 Personal history of other diseases of the nervous system and sense organs: Secondary | ICD-10-CM | POA: Insufficient documentation

## 2014-06-06 DIAGNOSIS — Z9889 Other specified postprocedural states: Secondary | ICD-10-CM | POA: Insufficient documentation

## 2014-06-06 DIAGNOSIS — R079 Chest pain, unspecified: Secondary | ICD-10-CM | POA: Diagnosis not present

## 2014-06-06 DIAGNOSIS — Z87891 Personal history of nicotine dependence: Secondary | ICD-10-CM | POA: Insufficient documentation

## 2014-06-06 DIAGNOSIS — J45909 Unspecified asthma, uncomplicated: Secondary | ICD-10-CM | POA: Diagnosis not present

## 2014-06-06 DIAGNOSIS — Z792 Long term (current) use of antibiotics: Secondary | ICD-10-CM | POA: Insufficient documentation

## 2014-06-06 DIAGNOSIS — Z8639 Personal history of other endocrine, nutritional and metabolic disease: Secondary | ICD-10-CM | POA: Insufficient documentation

## 2014-06-06 DIAGNOSIS — R059 Cough, unspecified: Secondary | ICD-10-CM

## 2014-06-06 DIAGNOSIS — R05 Cough: Secondary | ICD-10-CM | POA: Diagnosis not present

## 2014-06-06 DIAGNOSIS — R0602 Shortness of breath: Secondary | ICD-10-CM | POA: Diagnosis not present

## 2014-06-06 DIAGNOSIS — N390 Urinary tract infection, site not specified: Secondary | ICD-10-CM | POA: Diagnosis not present

## 2014-06-06 MED ORDER — DM-GUAIFENESIN ER 30-600 MG PO TB12: 1.0000 | ORAL_TABLET | Freq: Two times a day (BID) | ORAL | Status: DC

## 2014-06-06 MED ORDER — BENZONATATE 100 MG PO CAPS: 100.0000 mg | ORAL_CAPSULE | Freq: Three times a day (TID) | ORAL | Status: DC

## 2014-06-06 NOTE — ED Notes (Signed)
Ambulated patient around ED.  SpO2 96% prior to walking, 98% at the finish.  Patient tolerated well.  No complications noted.

## 2014-06-06 NOTE — ED Provider Notes (Signed)
Medical screening examination/treatment/procedure(s) were performed by non-physician practitioner and as supervising physician I was immediately available for consultation/collaboration.   EKG Interpretation None        Evelina Bucy, MD 06/06/14 2334

## 2014-06-06 NOTE — ED Notes (Signed)
States she is having SOB, saw her PCP Friday and was here Thursday. States her abd is "swollen", her SOB is not improving and she is easily fatigued.

## 2014-06-06 NOTE — Discharge Instructions (Signed)
Call for a follow up appointment with a Family or Primary Care Provider.  Return if Symptoms worsen.   Take medication as prescribed.  Drink plenty of fluids. Saltwater gargles 3-4 times a day.

## 2014-06-06 NOTE — ED Provider Notes (Signed)
Medical screening examination/treatment/procedure(s) were performed by non-physician practitioner and as supervising physician I was immediately available for consultation/collaboration.   EKG Interpretation   Date/Time:  Saturday June 06 2014 17:07:00 EDT Ventricular Rate:  84 PR Interval:  114 QRS Duration: 80 QT Interval:  350 QTC Calculation: 413 R Axis:   31 Text Interpretation:  Normal sinus rhythm Nonspecific ST and T wave  abnormality Abnormal ECG no significant change from 02/05/14 Confirmed by  DELOS  MD, Ramonica Grigg ([id number]) on 06/06/2014 5:47:46 PM       [name], MD 06/06/14 2319

## 2014-06-06 NOTE — ED Notes (Signed)
Pt discharged to home with family. NAD.  
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
## 2014-06-08 DIAGNOSIS — Z886 Allergy status to analgesic agent status: Secondary | ICD-10-CM | POA: Diagnosis not present

## 2014-06-08 DIAGNOSIS — Z882 Allergy status to sulfonamides status: Secondary | ICD-10-CM | POA: Diagnosis not present

## 2014-06-08 DIAGNOSIS — G43909 Migraine, unspecified, not intractable, without status migrainosus: Secondary | ICD-10-CM | POA: Diagnosis not present

## 2014-06-08 DIAGNOSIS — K219 Gastro-esophageal reflux disease without esophagitis: Secondary | ICD-10-CM | POA: Diagnosis not present

## 2014-06-08 DIAGNOSIS — Z91048 Other nonmedicinal substance allergy status: Secondary | ICD-10-CM | POA: Diagnosis not present

## 2014-06-08 DIAGNOSIS — G35 Multiple sclerosis: Secondary | ICD-10-CM | POA: Diagnosis not present

## 2014-06-08 DIAGNOSIS — E785 Hyperlipidemia, unspecified: Secondary | ICD-10-CM | POA: Diagnosis not present

## 2014-06-08 DIAGNOSIS — M79604 Pain in right leg: Secondary | ICD-10-CM | POA: Diagnosis not present

## 2014-06-08 DIAGNOSIS — R0602 Shortness of breath: Secondary | ICD-10-CM | POA: Diagnosis not present

## 2014-06-08 DIAGNOSIS — Z885 Allergy status to narcotic agent status: Secondary | ICD-10-CM | POA: Diagnosis not present

## 2014-06-08 DIAGNOSIS — S2231XA Fracture of one rib, right side, initial encounter for closed fracture: Secondary | ICD-10-CM | POA: Diagnosis not present

## 2014-06-08 DIAGNOSIS — R0789 Other chest pain: Secondary | ICD-10-CM | POA: Diagnosis not present

## 2014-06-08 DIAGNOSIS — Z90710 Acquired absence of both cervix and uterus: Secondary | ICD-10-CM | POA: Diagnosis not present

## 2014-06-08 DIAGNOSIS — Z888 Allergy status to other drugs, medicaments and biological substances status: Secondary | ICD-10-CM | POA: Diagnosis not present

## 2014-06-08 DIAGNOSIS — Z880 Allergy status to penicillin: Secondary | ICD-10-CM | POA: Diagnosis not present

## 2014-06-08 DIAGNOSIS — M79605 Pain in left leg: Secondary | ICD-10-CM | POA: Diagnosis not present

## 2014-06-08 DIAGNOSIS — M79609 Pain in unspecified limb: Secondary | ICD-10-CM | POA: Diagnosis not present

## 2014-06-08 DIAGNOSIS — Z91018 Allergy to other foods: Secondary | ICD-10-CM | POA: Diagnosis not present

## 2014-06-08 DIAGNOSIS — Z881 Allergy status to other antibiotic agents status: Secondary | ICD-10-CM | POA: Diagnosis not present

## 2014-06-08 DIAGNOSIS — Z9851 Tubal ligation status: Secondary | ICD-10-CM | POA: Diagnosis not present

## 2014-06-08 DIAGNOSIS — Z8542 Personal history of malignant neoplasm of other parts of uterus: Secondary | ICD-10-CM | POA: Diagnosis not present

## 2014-06-08 DIAGNOSIS — F419 Anxiety disorder, unspecified: Secondary | ICD-10-CM | POA: Diagnosis not present

## 2014-06-08 DIAGNOSIS — Z9049 Acquired absence of other specified parts of digestive tract: Secondary | ICD-10-CM | POA: Diagnosis not present

## 2014-06-08 DIAGNOSIS — Z87891 Personal history of nicotine dependence: Secondary | ICD-10-CM | POA: Diagnosis not present

## 2014-06-08 DIAGNOSIS — J45909 Unspecified asthma, uncomplicated: Secondary | ICD-10-CM | POA: Diagnosis not present

## 2014-06-08 DIAGNOSIS — I10 Essential (primary) hypertension: Secondary | ICD-10-CM | POA: Diagnosis not present

## 2014-06-08 DIAGNOSIS — Z79899 Other long term (current) drug therapy: Secondary | ICD-10-CM | POA: Diagnosis not present

## 2014-06-09 DIAGNOSIS — G35 Multiple sclerosis: Secondary | ICD-10-CM | POA: Diagnosis not present

## 2014-06-09 DIAGNOSIS — F419 Anxiety disorder, unspecified: Secondary | ICD-10-CM | POA: Diagnosis not present

## 2014-06-09 DIAGNOSIS — Z882 Allergy status to sulfonamides status: Secondary | ICD-10-CM | POA: Diagnosis not present

## 2014-06-09 DIAGNOSIS — Z79899 Other long term (current) drug therapy: Secondary | ICD-10-CM | POA: Diagnosis not present

## 2014-06-09 DIAGNOSIS — R0602 Shortness of breath: Secondary | ICD-10-CM | POA: Diagnosis not present

## 2014-06-09 DIAGNOSIS — Z880 Allergy status to penicillin: Secondary | ICD-10-CM | POA: Diagnosis not present

## 2014-06-09 DIAGNOSIS — Z9851 Tubal ligation status: Secondary | ICD-10-CM | POA: Diagnosis not present

## 2014-06-09 DIAGNOSIS — I10 Essential (primary) hypertension: Secondary | ICD-10-CM | POA: Diagnosis not present

## 2014-06-09 DIAGNOSIS — Z885 Allergy status to narcotic agent status: Secondary | ICD-10-CM | POA: Diagnosis not present

## 2014-06-09 DIAGNOSIS — E785 Hyperlipidemia, unspecified: Secondary | ICD-10-CM | POA: Diagnosis not present

## 2014-06-09 DIAGNOSIS — R0789 Other chest pain: Secondary | ICD-10-CM | POA: Diagnosis not present

## 2014-06-09 DIAGNOSIS — Z87891 Personal history of nicotine dependence: Secondary | ICD-10-CM | POA: Diagnosis not present

## 2014-06-09 DIAGNOSIS — R079 Chest pain, unspecified: Secondary | ICD-10-CM | POA: Diagnosis not present

## 2014-06-09 DIAGNOSIS — S2231XA Fracture of one rib, right side, initial encounter for closed fracture: Secondary | ICD-10-CM | POA: Diagnosis not present

## 2014-06-09 DIAGNOSIS — Z888 Allergy status to other drugs, medicaments and biological substances status: Secondary | ICD-10-CM | POA: Diagnosis not present

## 2014-06-09 DIAGNOSIS — Z90710 Acquired absence of both cervix and uterus: Secondary | ICD-10-CM | POA: Diagnosis not present

## 2014-06-09 DIAGNOSIS — Z8541 Personal history of malignant neoplasm of cervix uteri: Secondary | ICD-10-CM | POA: Diagnosis not present

## 2014-06-09 DIAGNOSIS — J45909 Unspecified asthma, uncomplicated: Secondary | ICD-10-CM | POA: Diagnosis not present

## 2014-06-09 DIAGNOSIS — K219 Gastro-esophageal reflux disease without esophagitis: Secondary | ICD-10-CM | POA: Diagnosis not present

## 2014-06-09 DIAGNOSIS — Z91018 Allergy to other foods: Secondary | ICD-10-CM | POA: Diagnosis not present

## 2014-06-09 DIAGNOSIS — Z881 Allergy status to other antibiotic agents status: Secondary | ICD-10-CM | POA: Diagnosis not present

## 2014-06-09 DIAGNOSIS — Z7952 Long term (current) use of systemic steroids: Secondary | ICD-10-CM | POA: Diagnosis not present

## 2014-06-09 DIAGNOSIS — Z9089 Acquired absence of other organs: Secondary | ICD-10-CM | POA: Diagnosis not present

## 2014-06-10 ENCOUNTER — Telehealth: Payer: Self-pay | Admitting: *Deleted

## 2014-06-10 MED ORDER — SPIRONOLACTONE 12.5 MG HALF TABLET: 12.5000 mg | ORAL_TABLET | Freq: Every day | ORAL | Status: DC

## 2014-06-10 NOTE — Telephone Encounter (Addendum)
Ok, will send over low dose. Take a half initially.  We can retest sugar. [place] for nurse visit for this.   Please also let her know that the ABIs were ordered of her l legs are normal. No suggestion of abnormal blood flow or peripheral vascular disease.

## 2014-06-10 NOTE — Telephone Encounter (Signed)
Alle called this morning saying that she wants a new BP med sent to pharmacy. She requested SPIRONOLACTONE. She said that it dried her up like a sponge but she would deal with that and push fluids but the mycardis is making her hoarse and making it hard to breath like her throat is closing off. She said it did the same thing with norvasc.  She also would like to have the test done to retest for sugar as she was in the ER yesterday for breaking out in a sweat and being jittery. [name], RMA

## 2014-06-10 NOTE — Telephone Encounter (Signed)
LM on voicemail re: rx sent to pharmacy and to call and schedule a nurse visit for A1c. [name], RMA

## 2014-06-13 DIAGNOSIS — R5383 Other fatigue: Secondary | ICD-10-CM | POA: Diagnosis not present

## 2014-06-13 DIAGNOSIS — R002 Palpitations: Secondary | ICD-10-CM | POA: Diagnosis not present

## 2014-06-13 DIAGNOSIS — G35 Multiple sclerosis: Secondary | ICD-10-CM | POA: Diagnosis not present

## 2014-06-13 DIAGNOSIS — Z87820 Personal history of traumatic brain injury: Secondary | ICD-10-CM | POA: Diagnosis not present

## 2014-06-13 DIAGNOSIS — R140 Abdominal distension (gaseous): Secondary | ICD-10-CM | POA: Diagnosis not present

## 2014-06-13 DIAGNOSIS — G8929 Other chronic pain: Secondary | ICD-10-CM | POA: Diagnosis not present

## 2014-06-13 DIAGNOSIS — R61 Generalized hyperhidrosis: Secondary | ICD-10-CM | POA: Diagnosis not present

## 2014-06-13 DIAGNOSIS — R079 Chest pain, unspecified: Secondary | ICD-10-CM | POA: Diagnosis not present

## 2014-06-13 DIAGNOSIS — K760 Fatty (change of) liver, not elsewhere classified: Secondary | ICD-10-CM | POA: Diagnosis not present

## 2014-06-13 DIAGNOSIS — I10 Essential (primary) hypertension: Secondary | ICD-10-CM | POA: Diagnosis not present

## 2014-06-13 DIAGNOSIS — R0789 Other chest pain: Secondary | ICD-10-CM | POA: Diagnosis not present

## 2014-06-13 DIAGNOSIS — Z8542 Personal history of malignant neoplasm of other parts of uterus: Secondary | ICD-10-CM | POA: Diagnosis not present

## 2014-06-13 DIAGNOSIS — R110 Nausea: Secondary | ICD-10-CM | POA: Diagnosis not present

## 2014-06-13 DIAGNOSIS — R51 Headache: Secondary | ICD-10-CM | POA: Diagnosis not present

## 2014-06-13 DIAGNOSIS — M549 Dorsalgia, unspecified: Secondary | ICD-10-CM | POA: Diagnosis not present

## 2014-06-15 ENCOUNTER — Encounter (HOSPITAL_BASED_OUTPATIENT_CLINIC_OR_DEPARTMENT_OTHER): Payer: Self-pay | Admitting: Emergency Medicine

## 2014-06-16 ENCOUNTER — Encounter: Payer: Self-pay | Admitting: Family Medicine

## 2014-06-16 ENCOUNTER — Ambulatory Visit (INDEPENDENT_AMBULATORY_CARE_PROVIDER_SITE_OTHER): Payer: Medicare Other | Admitting: Family Medicine

## 2014-06-16 VITALS — BP 122/84 | HR 107 | Ht 62.0 in | Wt 191.0 lb

## 2014-06-16 DIAGNOSIS — Z8669 Personal history of other diseases of the nervous system and sense organs: Secondary | ICD-10-CM | POA: Diagnosis not present

## 2014-06-16 DIAGNOSIS — G44219 Episodic tension-type headache, not intractable: Secondary | ICD-10-CM

## 2014-06-16 DIAGNOSIS — K2100 Gastro-esophageal reflux disease with esophagitis, without bleeding: Secondary | ICD-10-CM

## 2014-06-16 DIAGNOSIS — R635 Abnormal weight gain: Secondary | ICD-10-CM | POA: Diagnosis not present

## 2014-06-16 DIAGNOSIS — F341 Dysthymic disorder: Secondary | ICD-10-CM | POA: Diagnosis not present

## 2014-06-16 DIAGNOSIS — F4310 Post-traumatic stress disorder, unspecified: Secondary | ICD-10-CM | POA: Diagnosis not present

## 2014-06-16 DIAGNOSIS — I10 Essential (primary) hypertension: Secondary | ICD-10-CM | POA: Diagnosis not present

## 2014-06-16 NOTE — Progress Notes (Signed)
   Subjective:    Patient ID: Jennifer [name], female    DOB: 07/22/1966, 48 y.o.   MRN: [id number]  HPI Says she is concerned about her 1 lb weight gain.  She feels like when she eats her stomach it feels tight and uncomfortable. Then feels like can't drink much fluids. Still feels bloated. She has been eating fair.  Had soup and crackles.  Still drinking orange juice.     Feels like something is falling out of her vagina but has had a hysterectomy.  Says it was painful when occurred. It feels better today. Wonders if she should f/u with her gyn her gyn/onc.   HTN- Says did stat the spirionolactone She has been on it daily for one week.  She is taking half a tab.  HA have been better.  She is breaking out in sweats which is new.  Says doesn't feel that good on it but it trying to stick with it.    Depression/Anxiety - she wants to hold off on starting medication for  this.  She wants to put this on the back burner until sh eis doing well on BP medication.    Review of Systems     Objective:   Physical Exam  Constitutional: She is oriented to person, place, and time. She appears well-developed and well-nourished.  HENT:  Head: Normocephalic and atraumatic.  Cardiovascular: Normal rate, regular rhythm and normal heart sounds.   Pulmonary/Chest: Effort normal and breath sounds normal.  Neurological: She is alert and oriented to person, place, and time.  Skin: Skin is warm and dry.  Psychiatric: She has a normal mood and affect. Her behavior is normal.          Assessment & Plan:  GERD- reminded her to really watch her diet and avoid orange juice and acidic foods. Stick with more bland choices.   Vaginal pressure/pain- encouraged her to f/u with her gyn for evaluation of bladder prolpase.    HTN - well controled today. Looks fantastic.  F/U in 2 weks. Stay with half tab until then. Will check BMP at that time.   Depression/Anxiety/dysthymic d/o- will hold off on tx for now.    abormal weight gain - will check thyroid levels. Also rec smart phone app called My Fitness Pal to use for next 2 weeks to track calories.

## 2014-06-16 NOTE — Patient Instructions (Addendum)
My Fitness Pal to help count your calories Make sure eating a bland diet.  Stay away from heavy creams, creamy salad dressing.

## 2014-06-16 NOTE — Assessment & Plan Note (Signed)
Discussed counting her calories.  Recommend using the smat phone app called My Fitness pal.  See how doing on it over next 2 weeks. Consider nutrition referral.

## 2014-06-16 NOTE — Assessment & Plan Note (Signed)
Much improved today. Continue current regimen of half a tab daily.  F/U in 2 weeks. Will check BMP at follow up.

## 2014-06-17 ENCOUNTER — Emergency Department (HOSPITAL_BASED_OUTPATIENT_CLINIC_OR_DEPARTMENT_OTHER)
Admission: EM | Admit: 2014-06-17 | Discharge: 2014-06-17 | Disposition: A | Discharge status: Home / Self Care | Payer: Medicare Other | Attending: Emergency Medicine | Admitting: Emergency Medicine

## 2014-06-17 ENCOUNTER — Encounter (HOSPITAL_BASED_OUTPATIENT_CLINIC_OR_DEPARTMENT_OTHER): Payer: Self-pay

## 2014-06-17 ENCOUNTER — Emergency Department (HOSPITAL_BASED_OUTPATIENT_CLINIC_OR_DEPARTMENT_OTHER): Payer: Medicare Other

## 2014-06-17 DIAGNOSIS — E269 Hyperaldosteronism, unspecified: Secondary | ICD-10-CM | POA: Diagnosis not present

## 2014-06-17 DIAGNOSIS — Z8542 Personal history of malignant neoplasm of other parts of uterus: Secondary | ICD-10-CM | POA: Diagnosis not present

## 2014-06-17 DIAGNOSIS — R0602 Shortness of breath: Secondary | ICD-10-CM | POA: Diagnosis not present

## 2014-06-17 DIAGNOSIS — Z85828 Personal history of other malignant neoplasm of skin: Secondary | ICD-10-CM | POA: Diagnosis not present

## 2014-06-17 DIAGNOSIS — E162 Hypoglycemia, unspecified: Secondary | ICD-10-CM | POA: Diagnosis not present

## 2014-06-17 DIAGNOSIS — R110 Nausea: Secondary | ICD-10-CM | POA: Diagnosis not present

## 2014-06-17 DIAGNOSIS — Z79899 Other long term (current) drug therapy: Secondary | ICD-10-CM | POA: Diagnosis not present

## 2014-06-17 DIAGNOSIS — G8929 Other chronic pain: Secondary | ICD-10-CM | POA: Insufficient documentation

## 2014-06-17 DIAGNOSIS — G4730 Sleep apnea, unspecified: Secondary | ICD-10-CM | POA: Insufficient documentation

## 2014-06-17 DIAGNOSIS — R002 Palpitations: Secondary | ICD-10-CM | POA: Diagnosis not present

## 2014-06-17 DIAGNOSIS — J45909 Unspecified asthma, uncomplicated: Secondary | ICD-10-CM | POA: Insufficient documentation

## 2014-06-17 DIAGNOSIS — R51 Headache: Secondary | ICD-10-CM | POA: Insufficient documentation

## 2014-06-17 DIAGNOSIS — Z880 Allergy status to penicillin: Secondary | ICD-10-CM | POA: Insufficient documentation

## 2014-06-17 DIAGNOSIS — F40240 Claustrophobia: Secondary | ICD-10-CM | POA: Insufficient documentation

## 2014-06-17 DIAGNOSIS — K760 Fatty (change of) liver, not elsewhere classified: Secondary | ICD-10-CM | POA: Diagnosis not present

## 2014-06-17 DIAGNOSIS — D329 Benign neoplasm of meninges, unspecified: Secondary | ICD-10-CM | POA: Diagnosis not present

## 2014-06-17 DIAGNOSIS — F419 Anxiety disorder, unspecified: Secondary | ICD-10-CM | POA: Insufficient documentation

## 2014-06-17 DIAGNOSIS — K219 Gastro-esophageal reflux disease without esophagitis: Secondary | ICD-10-CM | POA: Diagnosis not present

## 2014-06-17 DIAGNOSIS — E876 Hypokalemia: Secondary | ICD-10-CM | POA: Diagnosis not present

## 2014-06-17 DIAGNOSIS — R0789 Other chest pain: Secondary | ICD-10-CM | POA: Diagnosis not present

## 2014-06-17 DIAGNOSIS — I10 Essential (primary) hypertension: Secondary | ICD-10-CM | POA: Insufficient documentation

## 2014-06-17 DIAGNOSIS — R350 Frequency of micturition: Secondary | ICD-10-CM | POA: Diagnosis not present

## 2014-06-17 DIAGNOSIS — R2233 Localized swelling, mass and lump, upper limb, bilateral: Secondary | ICD-10-CM | POA: Diagnosis not present

## 2014-06-17 DIAGNOSIS — Z87891 Personal history of nicotine dependence: Secondary | ICD-10-CM | POA: Insufficient documentation

## 2014-06-17 DIAGNOSIS — Z862 Personal history of diseases of the blood and blood-forming organs and certain disorders involving the immune mechanism: Secondary | ICD-10-CM | POA: Diagnosis not present

## 2014-06-17 DIAGNOSIS — Z9981 Dependence on supplemental oxygen: Secondary | ICD-10-CM | POA: Diagnosis not present

## 2014-06-17 DIAGNOSIS — G35 Multiple sclerosis: Secondary | ICD-10-CM | POA: Diagnosis not present

## 2014-06-17 DIAGNOSIS — Z87820 Personal history of traumatic brain injury: Secondary | ICD-10-CM | POA: Diagnosis not present

## 2014-06-17 DIAGNOSIS — R519 Headache, unspecified: Secondary | ICD-10-CM

## 2014-06-17 LAB — CBC WITH DIFFERENTIAL/PLATELET
Basophils Absolute: 0 10*3/uL (ref 0.0–0.1)
Basophils Relative: 0 % (ref 0–1)
Eosinophils Absolute: 0.2 10*3/uL (ref 0.0–0.7)
Eosinophils Relative: 2 % (ref 0–5)
HCT: 40.5 % (ref 36.0–46.0)
Hemoglobin: 13.4 g/dL (ref 12.0–15.0)
Lymphocytes Relative: 22 % (ref 12–46)
Lymphs Abs: 1.5 10*3/uL (ref 0.7–4.0)
MCH: 29.1 pg (ref 26.0–34.0)
MCHC: 33.1 g/dL (ref 30.0–36.0)
MCV: 88 fL (ref 78.0–100.0)
Monocytes Absolute: 0.7 10*3/uL (ref 0.1–1.0)
Monocytes Relative: 10 % (ref 3–12)
Neutro Abs: 4.2 10*3/uL (ref 1.7–7.7)
Neutrophils Relative %: 66 % (ref 43–77)
Platelets: 238 10*3/uL (ref 150–400)
RBC: 4.6 MIL/uL (ref 3.87–5.11)
RDW: 14.1 % (ref 11.5–15.5)
WBC: 6.5 10*3/uL (ref 4.0–10.5)

## 2014-06-17 LAB — BASIC METABOLIC PANEL
Anion gap: 13 (ref 5–15)
BUN: 9 mg/dL (ref 6–23)
CO2: 25 mEq/L (ref 19–32)
Calcium: 9.6 mg/dL (ref 8.4–10.5)
Chloride: 105 mEq/L (ref 96–112)
Creatinine, Ser: 0.5 mg/dL (ref 0.50–1.10)
GFR calc Af Amer: >90 mL/min (ref >90)
GFR calc non Af Amer: >90 mL/min (ref >90)
Glucose, Bld: 85 mg/dL (ref 70–99)
Potassium: 4 mEq/L (ref 3.7–5.3)
Sodium: 143 mEq/L (ref 137–147)

## 2014-06-17 LAB — T4, FREE: Free T4: 1.19 ng/dL (ref 0.80–1.80)

## 2014-06-17 LAB — TSH: TSH: 1.61 u[IU]/mL (ref 0.350–4.500)

## 2014-06-17 NOTE — ED Notes (Addendum)
HA and elevated BP x today while waiting at GYN office-states she was seen by PCP yesterday for elevated BP

## 2014-06-17 NOTE — Discharge Instructions (Signed)
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
## 2014-06-18 ENCOUNTER — Encounter: Payer: Self-pay | Admitting: Family Medicine

## 2014-06-18 LAB — THYROID PEROXIDASE ANTIBODY: Thyroperoxidase Ab SerPl-aCnc: <1 IU/mL (ref <9)

## 2014-06-19 DIAGNOSIS — Z8589 Personal history of malignant neoplasm of other organs and systems: Secondary | ICD-10-CM | POA: Diagnosis not present

## 2014-06-19 DIAGNOSIS — Z886 Allergy status to analgesic agent status: Secondary | ICD-10-CM | POA: Diagnosis not present

## 2014-06-19 DIAGNOSIS — K219 Gastro-esophageal reflux disease without esophagitis: Secondary | ICD-10-CM | POA: Diagnosis not present

## 2014-06-19 DIAGNOSIS — K5900 Constipation, unspecified: Secondary | ICD-10-CM | POA: Diagnosis not present

## 2014-06-19 DIAGNOSIS — R1013 Epigastric pain: Secondary | ICD-10-CM | POA: Diagnosis not present

## 2014-06-19 DIAGNOSIS — Z90710 Acquired absence of both cervix and uterus: Secondary | ICD-10-CM | POA: Diagnosis not present

## 2014-06-19 DIAGNOSIS — R1310 Dysphagia, unspecified: Secondary | ICD-10-CM | POA: Diagnosis not present

## 2014-06-19 DIAGNOSIS — Z9089 Acquired absence of other organs: Secondary | ICD-10-CM | POA: Diagnosis not present

## 2014-06-19 DIAGNOSIS — Z888 Allergy status to other drugs, medicaments and biological substances status: Secondary | ICD-10-CM | POA: Diagnosis not present

## 2014-06-19 DIAGNOSIS — I10 Essential (primary) hypertension: Secondary | ICD-10-CM | POA: Diagnosis not present

## 2014-06-19 DIAGNOSIS — Z882 Allergy status to sulfonamides status: Secondary | ICD-10-CM | POA: Diagnosis not present

## 2014-06-19 DIAGNOSIS — Z881 Allergy status to other antibiotic agents status: Secondary | ICD-10-CM | POA: Diagnosis not present

## 2014-06-19 DIAGNOSIS — K2270 Barrett's esophagus without dysplasia: Secondary | ICD-10-CM | POA: Diagnosis not present

## 2014-06-19 DIAGNOSIS — R1114 Bilious vomiting: Secondary | ICD-10-CM | POA: Diagnosis not present

## 2014-06-19 DIAGNOSIS — Z880 Allergy status to penicillin: Secondary | ICD-10-CM | POA: Diagnosis not present

## 2014-06-19 DIAGNOSIS — J45909 Unspecified asthma, uncomplicated: Secondary | ICD-10-CM | POA: Diagnosis not present

## 2014-06-19 DIAGNOSIS — E785 Hyperlipidemia, unspecified: Secondary | ICD-10-CM | POA: Diagnosis not present

## 2014-06-19 DIAGNOSIS — F419 Anxiety disorder, unspecified: Secondary | ICD-10-CM | POA: Diagnosis not present

## 2014-06-19 DIAGNOSIS — E611 Iron deficiency: Secondary | ICD-10-CM | POA: Diagnosis not present

## 2014-06-19 DIAGNOSIS — Z79899 Other long term (current) drug therapy: Secondary | ICD-10-CM | POA: Diagnosis not present

## 2014-06-19 DIAGNOSIS — G35 Multiple sclerosis: Secondary | ICD-10-CM | POA: Diagnosis not present

## 2014-06-19 DIAGNOSIS — Z885 Allergy status to narcotic agent status: Secondary | ICD-10-CM | POA: Diagnosis not present

## 2014-06-19 DIAGNOSIS — Z8542 Personal history of malignant neoplasm of other parts of uterus: Secondary | ICD-10-CM | POA: Diagnosis not present

## 2014-06-19 DIAGNOSIS — Z87891 Personal history of nicotine dependence: Secondary | ICD-10-CM | POA: Diagnosis not present

## 2014-06-19 DIAGNOSIS — Z9851 Tubal ligation status: Secondary | ICD-10-CM | POA: Diagnosis not present

## 2014-06-19 DIAGNOSIS — R110 Nausea: Secondary | ICD-10-CM | POA: Diagnosis not present

## 2014-06-19 DIAGNOSIS — Z91018 Allergy to other foods: Secondary | ICD-10-CM | POA: Diagnosis not present

## 2014-06-19 DIAGNOSIS — R1084 Generalized abdominal pain: Secondary | ICD-10-CM | POA: Diagnosis not present

## 2014-06-20 DIAGNOSIS — R110 Nausea: Secondary | ICD-10-CM | POA: Diagnosis not present

## 2014-06-20 DIAGNOSIS — R1114 Bilious vomiting: Secondary | ICD-10-CM | POA: Diagnosis not present

## 2014-06-20 DIAGNOSIS — K5900 Constipation, unspecified: Secondary | ICD-10-CM | POA: Diagnosis not present

## 2014-06-20 DIAGNOSIS — R1013 Epigastric pain: Secondary | ICD-10-CM | POA: Diagnosis not present

## 2014-06-20 IMAGING — CR DG CHEST 2V
2 series · 2 of 2 positions shown · non-contrast
Comparison: February 21, 2013.

CLINICAL DATA: Cough, rib pain.

CHEST - 2 VIEW

[w chest pa]
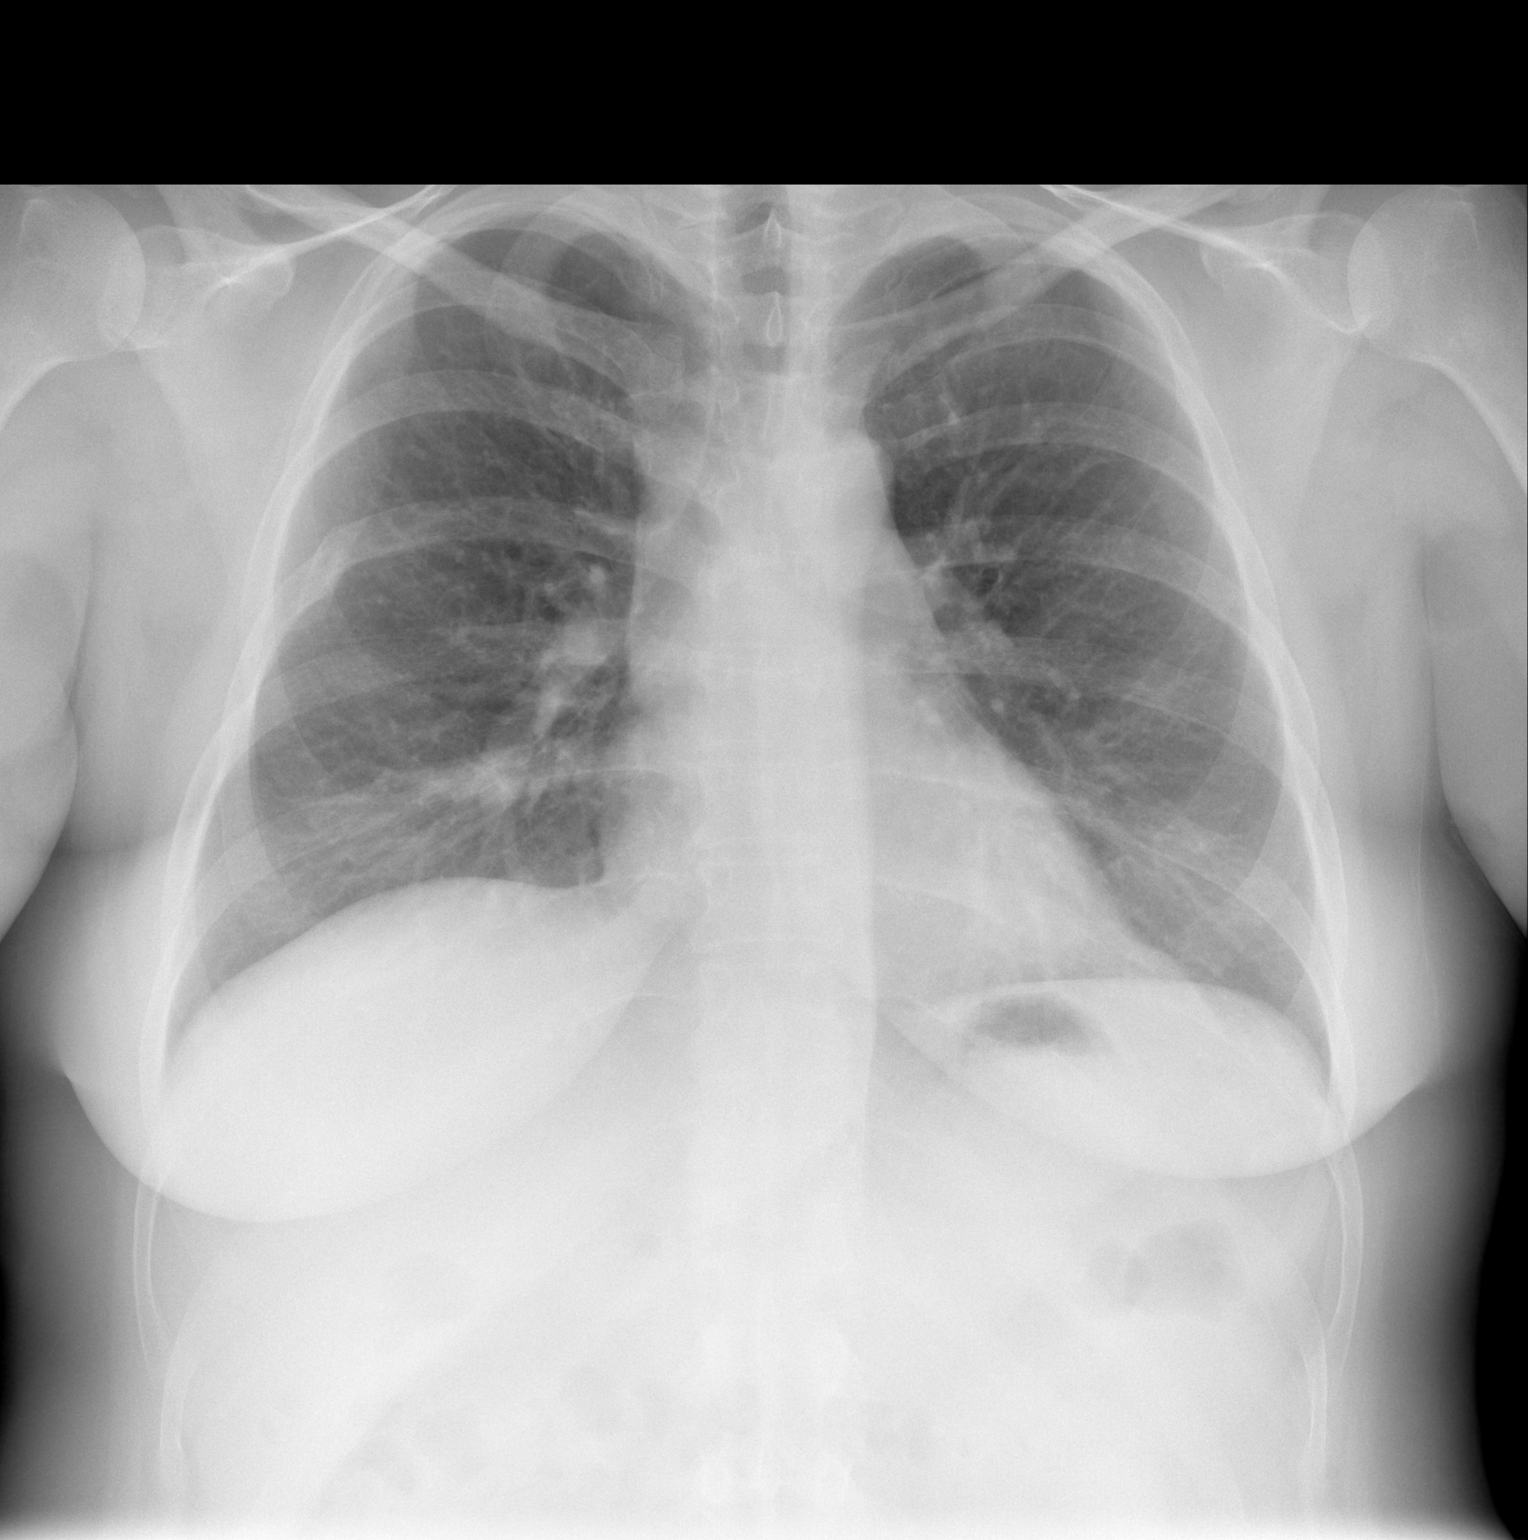

[w chest lat]
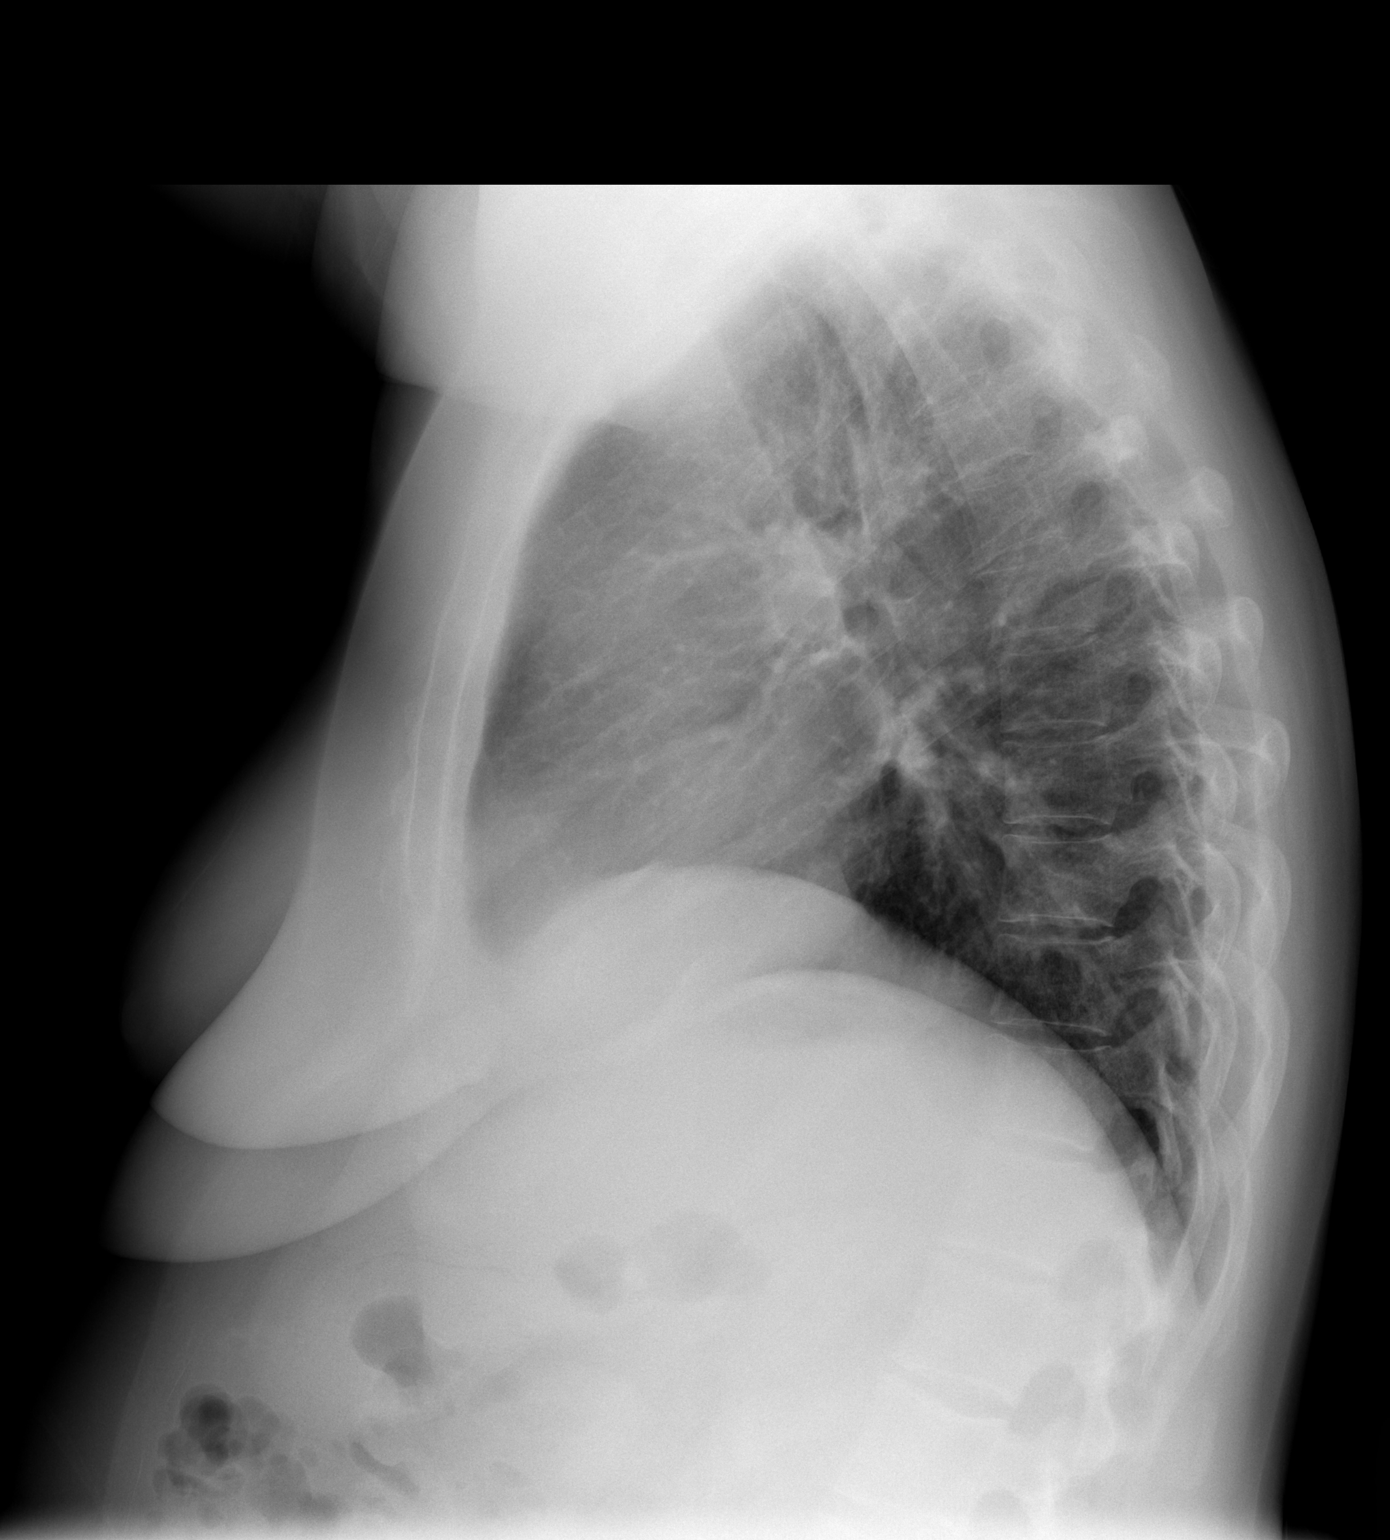

[2 of 2 positions shown; findings below may reference images not displayed]

FINDINGS: Cardiomediastinal silhouette appears normal.  No acute
pulmonary disease is noted.  Old right rib fracture is noted.  No
pneumothorax or pleural effusion is noted.
IMPRESSION: No acute cardiopulmonary abnormality seen.

## 2014-06-22 ENCOUNTER — Other Ambulatory Visit: Payer: Self-pay | Admitting: Family Medicine

## 2014-06-23 MED ORDER — MUPIROCIN 2 % EX OINT: TOPICAL_OINTMENT | CUTANEOUS | Status: DC

## 2014-06-24 ENCOUNTER — Telehealth: Payer: Self-pay | Admitting: *Deleted

## 2014-06-24 DIAGNOSIS — I10 Essential (primary) hypertension: Secondary | ICD-10-CM | POA: Diagnosis not present

## 2014-06-24 DIAGNOSIS — Z87820 Personal history of traumatic brain injury: Secondary | ICD-10-CM | POA: Diagnosis not present

## 2014-06-24 DIAGNOSIS — F419 Anxiety disorder, unspecified: Secondary | ICD-10-CM | POA: Diagnosis not present

## 2014-06-24 DIAGNOSIS — K760 Fatty (change of) liver, not elsewhere classified: Secondary | ICD-10-CM | POA: Diagnosis not present

## 2014-06-24 DIAGNOSIS — E876 Hypokalemia: Secondary | ICD-10-CM | POA: Diagnosis not present

## 2014-06-24 DIAGNOSIS — R000 Tachycardia, unspecified: Secondary | ICD-10-CM | POA: Diagnosis not present

## 2014-06-24 DIAGNOSIS — R079 Chest pain, unspecified: Secondary | ICD-10-CM | POA: Diagnosis not present

## 2014-06-24 DIAGNOSIS — G8929 Other chronic pain: Secondary | ICD-10-CM | POA: Diagnosis not present

## 2014-06-24 DIAGNOSIS — G35 Multiple sclerosis: Secondary | ICD-10-CM | POA: Diagnosis not present

## 2014-06-24 DIAGNOSIS — R002 Palpitations: Secondary | ICD-10-CM | POA: Diagnosis not present

## 2014-06-25 DIAGNOSIS — F332 Major depressive disorder, recurrent severe without psychotic features: Secondary | ICD-10-CM | POA: Diagnosis not present

## 2014-06-25 DIAGNOSIS — F419 Anxiety disorder, unspecified: Secondary | ICD-10-CM | POA: Diagnosis not present

## 2014-06-25 DIAGNOSIS — R002 Palpitations: Secondary | ICD-10-CM | POA: Diagnosis not present

## 2014-06-25 DIAGNOSIS — R000 Tachycardia, unspecified: Secondary | ICD-10-CM | POA: Diagnosis not present

## 2014-06-25 DIAGNOSIS — R079 Chest pain, unspecified: Secondary | ICD-10-CM | POA: Diagnosis not present

## 2014-06-25 DIAGNOSIS — E876 Hypokalemia: Secondary | ICD-10-CM | POA: Diagnosis not present

## 2014-06-25 NOTE — Telephone Encounter (Signed)
Jennifer [name] called this morning and states she was in the ED, [facility], due to palpitations. While she was there the ED called [facility]. [facility] stated Jennifer [name] had ventricular tachycardia episode on 06/22/2014. Her potassium was low at the ED and she was advised to stop the spironolactone. I advised patient to call her Cardiologist.   Jennifer [name] was seen by the cardiologist office this morning after we spoke. She states they have scheduled her for a stress echo for tomorrow. She was advised to not take any medications today. It was also recommended to follow up with her Endocrinologist about her Hyperaldosteronism. She states she has seen Bynum Bellows MD Address: [address] Jennifer [address] [place], [place] [address] [phone] but she is no longer in practice with [facility]. I called [facility] Address: [address], [place], [place] [address] [phone] and they report Jennifer [name] was then seen by Jennifer Jefferson, PA in July of this year and advised to follow up with Jennifer [name] on 07/03/2014 at 1 pm. Jennifer [name] refuses to go to [facility] and wants to be referred to Jennifer Shin, MD [facility] Address: [address], [place], [place] [address] [phone]. I advised her she would need to call and cancel the appointment with Jennifer [name]. I also advised her that a new referral to Endocrinology may take a couple of months due to the holidays.   Jennifer [name] states she has trouble completely emptying her bladder and wanted to know if she should be seen by a Dealer.
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
## 2014-06-26 DIAGNOSIS — Z87891 Personal history of nicotine dependence: Secondary | ICD-10-CM | POA: Diagnosis not present

## 2014-06-26 DIAGNOSIS — Z79899 Other long term (current) drug therapy: Secondary | ICD-10-CM | POA: Diagnosis not present

## 2014-06-26 DIAGNOSIS — Z886 Allergy status to analgesic agent status: Secondary | ICD-10-CM | POA: Diagnosis not present

## 2014-06-26 DIAGNOSIS — I10 Essential (primary) hypertension: Secondary | ICD-10-CM | POA: Diagnosis not present

## 2014-06-26 DIAGNOSIS — Z9851 Tubal ligation status: Secondary | ICD-10-CM | POA: Diagnosis not present

## 2014-06-26 DIAGNOSIS — Z881 Allergy status to other antibiotic agents status: Secondary | ICD-10-CM | POA: Diagnosis not present

## 2014-06-26 DIAGNOSIS — J45909 Unspecified asthma, uncomplicated: Secondary | ICD-10-CM | POA: Diagnosis not present

## 2014-06-26 DIAGNOSIS — Z791 Long term (current) use of non-steroidal anti-inflammatories (NSAID): Secondary | ICD-10-CM | POA: Diagnosis not present

## 2014-06-26 DIAGNOSIS — G35 Multiple sclerosis: Secondary | ICD-10-CM | POA: Diagnosis not present

## 2014-06-26 DIAGNOSIS — Z882 Allergy status to sulfonamides status: Secondary | ICD-10-CM | POA: Diagnosis not present

## 2014-06-26 DIAGNOSIS — E876 Hypokalemia: Secondary | ICD-10-CM | POA: Diagnosis not present

## 2014-06-26 DIAGNOSIS — E785 Hyperlipidemia, unspecified: Secondary | ICD-10-CM | POA: Diagnosis not present

## 2014-06-26 DIAGNOSIS — Q428 Congenital absence, atresia and stenosis of other parts of large intestine: Secondary | ICD-10-CM | POA: Diagnosis not present

## 2014-06-26 DIAGNOSIS — Z9109 Other allergy status, other than to drugs and biological substances: Secondary | ICD-10-CM | POA: Diagnosis not present

## 2014-06-26 DIAGNOSIS — R079 Chest pain, unspecified: Secondary | ICD-10-CM | POA: Diagnosis not present

## 2014-06-26 DIAGNOSIS — R0789 Other chest pain: Secondary | ICD-10-CM | POA: Diagnosis not present

## 2014-06-26 DIAGNOSIS — Z9049 Acquired absence of other specified parts of digestive tract: Secondary | ICD-10-CM | POA: Diagnosis not present

## 2014-06-26 DIAGNOSIS — Z883 Allergy status to other anti-infective agents status: Secondary | ICD-10-CM | POA: Diagnosis not present

## 2014-06-26 NOTE — Telephone Encounter (Signed)
See if willing to try amlodipine again.  It comes in 2.5mg . I know we tried it a year ago with a half a tab.

## 2014-06-28 DIAGNOSIS — R079 Chest pain, unspecified: Secondary | ICD-10-CM | POA: Diagnosis not present

## 2014-06-28 DIAGNOSIS — G40909 Epilepsy, unspecified, not intractable, without status epilepticus: Secondary | ICD-10-CM | POA: Diagnosis not present

## 2014-06-28 DIAGNOSIS — F419 Anxiety disorder, unspecified: Secondary | ICD-10-CM | POA: Diagnosis not present

## 2014-06-28 DIAGNOSIS — K219 Gastro-esophageal reflux disease without esophagitis: Secondary | ICD-10-CM | POA: Diagnosis not present

## 2014-06-28 DIAGNOSIS — E271 Primary adrenocortical insufficiency: Secondary | ICD-10-CM | POA: Diagnosis not present

## 2014-06-28 DIAGNOSIS — R002 Palpitations: Secondary | ICD-10-CM | POA: Diagnosis not present

## 2014-06-28 DIAGNOSIS — R1310 Dysphagia, unspecified: Secondary | ICD-10-CM | POA: Diagnosis not present

## 2014-06-28 DIAGNOSIS — G35 Multiple sclerosis: Secondary | ICD-10-CM | POA: Diagnosis not present

## 2014-06-28 DIAGNOSIS — K589 Irritable bowel syndrome without diarrhea: Secondary | ICD-10-CM | POA: Diagnosis not present

## 2014-06-28 DIAGNOSIS — J45909 Unspecified asthma, uncomplicated: Secondary | ICD-10-CM | POA: Diagnosis not present

## 2014-06-28 DIAGNOSIS — Z8542 Personal history of malignant neoplasm of other parts of uterus: Secondary | ICD-10-CM | POA: Diagnosis not present

## 2014-06-28 DIAGNOSIS — E876 Hypokalemia: Secondary | ICD-10-CM | POA: Diagnosis not present

## 2014-06-28 DIAGNOSIS — Z881 Allergy status to other antibiotic agents status: Secondary | ICD-10-CM | POA: Diagnosis not present

## 2014-06-28 DIAGNOSIS — Z87891 Personal history of nicotine dependence: Secondary | ICD-10-CM | POA: Diagnosis not present

## 2014-06-28 DIAGNOSIS — Z886 Allergy status to analgesic agent status: Secondary | ICD-10-CM | POA: Diagnosis not present

## 2014-06-28 DIAGNOSIS — I10 Essential (primary) hypertension: Secondary | ICD-10-CM | POA: Diagnosis not present

## 2014-06-28 DIAGNOSIS — E669 Obesity, unspecified: Secondary | ICD-10-CM | POA: Diagnosis not present

## 2014-06-28 DIAGNOSIS — Z8249 Family history of ischemic heart disease and other diseases of the circulatory system: Secondary | ICD-10-CM | POA: Diagnosis not present

## 2014-06-28 DIAGNOSIS — F329 Major depressive disorder, single episode, unspecified: Secondary | ICD-10-CM | POA: Diagnosis not present

## 2014-06-28 DIAGNOSIS — Z833 Family history of diabetes mellitus: Secondary | ICD-10-CM | POA: Diagnosis not present

## 2014-06-28 DIAGNOSIS — I472 Ventricular tachycardia: Secondary | ICD-10-CM | POA: Diagnosis not present

## 2014-06-28 DIAGNOSIS — D509 Iron deficiency anemia, unspecified: Secondary | ICD-10-CM | POA: Diagnosis not present

## 2014-06-28 DIAGNOSIS — G4733 Obstructive sleep apnea (adult) (pediatric): Secondary | ICD-10-CM | POA: Diagnosis not present

## 2014-06-28 DIAGNOSIS — Z885 Allergy status to narcotic agent status: Secondary | ICD-10-CM | POA: Diagnosis not present

## 2014-06-28 DIAGNOSIS — R0789 Other chest pain: Secondary | ICD-10-CM | POA: Diagnosis not present

## 2014-06-28 DIAGNOSIS — Z6834 Body mass index (BMI) 34.0-34.9, adult: Secondary | ICD-10-CM | POA: Diagnosis not present

## 2014-06-29 DIAGNOSIS — R079 Chest pain, unspecified: Secondary | ICD-10-CM | POA: Diagnosis not present

## 2014-06-29 DIAGNOSIS — I472 Ventricular tachycardia: Secondary | ICD-10-CM | POA: Diagnosis not present

## 2014-06-29 DIAGNOSIS — R002 Palpitations: Secondary | ICD-10-CM | POA: Diagnosis not present

## 2014-06-29 NOTE — Telephone Encounter (Signed)
Left message for Jennifer [name] to return call.

## 2014-06-30 ENCOUNTER — Ambulatory Visit: Payer: Medicare Other | Admitting: Family Medicine

## 2014-06-30 DIAGNOSIS — R002 Palpitations: Secondary | ICD-10-CM | POA: Diagnosis not present

## 2014-07-02 ENCOUNTER — Ambulatory Visit: Payer: Medicare Other | Admitting: Family Medicine

## 2014-07-03 ENCOUNTER — Ambulatory Visit (INDEPENDENT_AMBULATORY_CARE_PROVIDER_SITE_OTHER): Payer: Medicare Other | Admitting: Family Medicine

## 2014-07-03 ENCOUNTER — Encounter: Payer: Self-pay | Admitting: Family Medicine

## 2014-07-03 VITALS — BP 140/82 | HR 100 | Temp 98.0°F | Wt 192.0 lb

## 2014-07-03 DIAGNOSIS — J0191 Acute recurrent sinusitis, unspecified: Secondary | ICD-10-CM | POA: Diagnosis not present

## 2014-07-03 DIAGNOSIS — R110 Nausea: Secondary | ICD-10-CM | POA: Diagnosis not present

## 2014-07-03 DIAGNOSIS — I10 Essential (primary) hypertension: Secondary | ICD-10-CM

## 2014-07-03 DIAGNOSIS — K2270 Barrett's esophagus without dysplasia: Secondary | ICD-10-CM | POA: Diagnosis not present

## 2014-07-03 DIAGNOSIS — G8929 Other chronic pain: Secondary | ICD-10-CM | POA: Diagnosis not present

## 2014-07-03 DIAGNOSIS — K760 Fatty (change of) liver, not elsewhere classified: Secondary | ICD-10-CM | POA: Diagnosis not present

## 2014-07-03 DIAGNOSIS — R7309 Other abnormal glucose: Secondary | ICD-10-CM | POA: Diagnosis not present

## 2014-07-03 DIAGNOSIS — Z87820 Personal history of traumatic brain injury: Secondary | ICD-10-CM | POA: Diagnosis not present

## 2014-07-03 DIAGNOSIS — R0602 Shortness of breath: Secondary | ICD-10-CM | POA: Diagnosis not present

## 2014-07-03 DIAGNOSIS — G35 Multiple sclerosis: Secondary | ICD-10-CM | POA: Diagnosis not present

## 2014-07-03 DIAGNOSIS — E876 Hypokalemia: Secondary | ICD-10-CM | POA: Diagnosis not present

## 2014-07-03 DIAGNOSIS — R0789 Other chest pain: Secondary | ICD-10-CM | POA: Diagnosis not present

## 2014-07-03 MED ORDER — METOPROLOL TARTRATE 25 MG PO TABS: 12.5000 mg | ORAL_TABLET | Freq: Two times a day (BID) | ORAL | Status: DC

## 2014-07-03 NOTE — Progress Notes (Signed)
   Subjective:    Patient ID: Jennifer [name], female    DOB: 04-Apr-1966, 48 y.o.   MRN: [id number]  HPI Has had sinus congestion and ST. Feels like getting mucous plugs. Has had some chest tightness.  Has started nasal rinses  She is scheduled for MRI of he heart on Monday.    Hypokalemia - She wants to know if can have a standing order for her potassium. When last time went to the ED and her potassium was under 3.   HTN- she idn't tolerate the spironiolactone.  Caused stomach discomfort.  She says her BP is back up and she is swelling again.     Review of Systems     Objective:   Physical Exam  Constitutional: She is oriented to person, place, and time. She appears well-developed and well-nourished.  HENT:  Head: Normocephalic and atraumatic.  Right Ear: External ear normal.  Left Ear: External ear normal.  Nose: Nose normal.  Mouth/Throat: Oropharynx is clear and moist.  TMs and canals are clear.   Eyes: Conjunctivae and EOM are normal. Pupils are equal, round, and reactive to light.  Neck: Neck supple. No thyromegaly present.  Cardiovascular: Normal rate, regular rhythm and normal heart sounds.   Pulmonary/Chest: Effort normal and breath sounds normal. She has no wheezes.  Lymphadenopathy:    She has no cervical adenopathy.  Neurological: She is alert and oriented to person, place, and time.  Skin: Skin is warm and dry.  Psychiatric: She has a normal mood and affect.          Assessment & Plan:  Sinusitis - contnue with her sinus rinses.  Call if not getting better or if develops fever.    Hypokakemia - Recheck potassium recently.    HTN - uncontrolled she says she would like to consider retrying the metoprolol again. There when she tried it previously it caused some chest tightness and squeezing sensation. If this doesn't work well and consider atenolol. She tried Bystolic in the past. Her cardiologist would prefer she be able to take a beta blocker

## 2014-07-03 NOTE — Progress Notes (Signed)
Spoke w/bella @ [facility] I asked her to dc metoprolol .[name] [place]

## 2014-07-06 DIAGNOSIS — I472 Ventricular tachycardia: Secondary | ICD-10-CM | POA: Diagnosis not present

## 2014-07-06 DIAGNOSIS — R072 Precordial pain: Secondary | ICD-10-CM | POA: Diagnosis not present

## 2014-07-06 DIAGNOSIS — R002 Palpitations: Secondary | ICD-10-CM | POA: Diagnosis not present

## 2014-07-07 ENCOUNTER — Ambulatory Visit: Payer: Medicare Other | Admitting: Family Medicine

## 2014-07-07 DIAGNOSIS — R002 Palpitations: Secondary | ICD-10-CM | POA: Diagnosis not present

## 2014-07-07 DIAGNOSIS — R079 Chest pain, unspecified: Secondary | ICD-10-CM | POA: Diagnosis not present

## 2014-07-07 DIAGNOSIS — I10 Essential (primary) hypertension: Secondary | ICD-10-CM | POA: Diagnosis not present

## 2014-07-07 DIAGNOSIS — R0602 Shortness of breath: Secondary | ICD-10-CM | POA: Diagnosis not present

## 2014-07-07 DIAGNOSIS — R110 Nausea: Secondary | ICD-10-CM | POA: Diagnosis not present

## 2014-07-08 ENCOUNTER — Encounter (HOSPITAL_BASED_OUTPATIENT_CLINIC_OR_DEPARTMENT_OTHER): Payer: Self-pay

## 2014-07-08 ENCOUNTER — Emergency Department (HOSPITAL_BASED_OUTPATIENT_CLINIC_OR_DEPARTMENT_OTHER): Payer: Medicare Other

## 2014-07-08 ENCOUNTER — Emergency Department (HOSPITAL_BASED_OUTPATIENT_CLINIC_OR_DEPARTMENT_OTHER)
Admission: EM | Admit: 2014-07-08 | Discharge: 2014-07-08 | Disposition: A | Discharge status: Home / Self Care | Payer: Medicare Other | Attending: Emergency Medicine | Admitting: Emergency Medicine

## 2014-07-08 DIAGNOSIS — G4730 Sleep apnea, unspecified: Secondary | ICD-10-CM | POA: Insufficient documentation

## 2014-07-08 DIAGNOSIS — Z79899 Other long term (current) drug therapy: Secondary | ICD-10-CM | POA: Insufficient documentation

## 2014-07-08 DIAGNOSIS — Z85828 Personal history of other malignant neoplasm of skin: Secondary | ICD-10-CM | POA: Insufficient documentation

## 2014-07-08 DIAGNOSIS — Z9049 Acquired absence of other specified parts of digestive tract: Secondary | ICD-10-CM | POA: Insufficient documentation

## 2014-07-08 DIAGNOSIS — Z880 Allergy status to penicillin: Secondary | ICD-10-CM | POA: Diagnosis not present

## 2014-07-08 DIAGNOSIS — J45909 Unspecified asthma, uncomplicated: Secondary | ICD-10-CM | POA: Insufficient documentation

## 2014-07-08 DIAGNOSIS — Z8739 Personal history of other diseases of the musculoskeletal system and connective tissue: Secondary | ICD-10-CM | POA: Diagnosis not present

## 2014-07-08 DIAGNOSIS — F4310 Post-traumatic stress disorder, unspecified: Secondary | ICD-10-CM | POA: Insufficient documentation

## 2014-07-08 DIAGNOSIS — I10 Essential (primary) hypertension: Secondary | ICD-10-CM | POA: Insufficient documentation

## 2014-07-08 DIAGNOSIS — Z8614 Personal history of Methicillin resistant Staphylococcus aureus infection: Secondary | ICD-10-CM | POA: Insufficient documentation

## 2014-07-08 DIAGNOSIS — R1030 Lower abdominal pain, unspecified: Secondary | ICD-10-CM | POA: Diagnosis not present

## 2014-07-08 DIAGNOSIS — R52 Pain, unspecified: Secondary | ICD-10-CM

## 2014-07-08 DIAGNOSIS — G8929 Other chronic pain: Secondary | ICD-10-CM | POA: Diagnosis not present

## 2014-07-08 DIAGNOSIS — K219 Gastro-esophageal reflux disease without esophagitis: Secondary | ICD-10-CM | POA: Diagnosis not present

## 2014-07-08 DIAGNOSIS — Z87891 Personal history of nicotine dependence: Secondary | ICD-10-CM | POA: Diagnosis not present

## 2014-07-08 DIAGNOSIS — Z9851 Tubal ligation status: Secondary | ICD-10-CM | POA: Diagnosis not present

## 2014-07-08 DIAGNOSIS — Z8542 Personal history of malignant neoplasm of other parts of uterus: Secondary | ICD-10-CM | POA: Diagnosis not present

## 2014-07-08 DIAGNOSIS — Z862 Personal history of diseases of the blood and blood-forming organs and certain disorders involving the immune mechanism: Secondary | ICD-10-CM | POA: Diagnosis not present

## 2014-07-08 DIAGNOSIS — R140 Abdominal distension (gaseous): Secondary | ICD-10-CM | POA: Diagnosis not present

## 2014-07-08 DIAGNOSIS — R102 Pelvic and perineal pain: Secondary | ICD-10-CM | POA: Diagnosis not present

## 2014-07-08 DIAGNOSIS — Z8639 Personal history of other endocrine, nutritional and metabolic disease: Secondary | ICD-10-CM | POA: Diagnosis not present

## 2014-07-08 DIAGNOSIS — Z9981 Dependence on supplemental oxygen: Secondary | ICD-10-CM | POA: Diagnosis not present

## 2014-07-08 LAB — URINALYSIS, ROUTINE W REFLEX MICROSCOPIC
Bilirubin Urine: NEGATIVE
Glucose, UA: NEGATIVE mg/dL
Hgb urine dipstick: NEGATIVE
Ketones, ur: NEGATIVE mg/dL
Leukocytes, UA: NEGATIVE
Nitrite: NEGATIVE
Protein, ur: NEGATIVE mg/dL
Specific Gravity, Urine: 1.024 (ref 1.005–1.030)
Urobilinogen, UA: 0.2 mg/dL (ref 0.0–1.0)
pH: 5.5 (ref 5.0–8.0)

## 2014-07-08 MED ORDER — NYSTATIN 100000 UNIT/ML MT SUSP: 500000.0000 [IU] | Freq: Four times a day (QID) | OROMUCOSAL | Status: DC
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
## 2014-07-08 NOTE — Discharge Instructions (Signed)
Abdominal Pain, Women °Abdominal (stomach, pelvic, or belly) pain can be caused by many things. It is important to tell your doctor: °· The location of the pain. °· Does it come and go or is it present all the time? °· Are there things that start the pain (eating certain foods, exercise)? °· Are there other symptoms associated with the pain (fever, nausea, vomiting, diarrhea)? °All of this is helpful to know when trying to find the cause of the pain. °CAUSES  °· Stomach: virus or bacteria infection, or ulcer. °· Intestine: appendicitis (inflamed appendix), regional ileitis (Crohn's disease), ulcerative colitis (inflamed colon), irritable bowel syndrome, diverticulitis (inflamed diverticulum of the colon), or cancer of the stomach or intestine. °· Gallbladder disease or stones in the gallbladder. °· Kidney disease, kidney stones, or infection. °· Pancreas infection or cancer. °· Fibromyalgia (pain disorder). °· Diseases of the female organs: °¨ Uterus: fibroid (non-cancerous) tumors or infection. °¨ Fallopian tubes: infection or tubal pregnancy. °¨ Ovary: cysts or tumors. °¨ Pelvic adhesions (scar tissue). °¨ Endometriosis (uterus lining tissue growing in the pelvis and on the pelvic organs). °¨ Pelvic congestion syndrome (female organs filling up with blood just before the menstrual period). °¨ Pain with the menstrual period. °¨ Pain with ovulation (producing an egg). °¨ Pain with an IUD (intrauterine device, birth control) in the uterus. °¨ Cancer of the female organs. °· Functional pain (pain not caused by a disease, may improve without treatment). °· Psychological pain. °· Depression. °DIAGNOSIS  °Your doctor will decide the seriousness of your pain by doing an examination. °· Blood tests. °· X-rays. °· Ultrasound. °· CT scan (computed tomography, special type of X-ray). °· MRI (magnetic resonance imaging). °· Cultures, for infection. °· Barium enema (dye inserted in the large intestine, to better view it with  X-rays). °· Colonoscopy (looking in intestine with a lighted tube). °· Laparoscopy (minor surgery, looking in abdomen with a lighted tube). °· Major abdominal exploratory surgery (looking in abdomen with a large incision). °TREATMENT  °The treatment will depend on the cause of the pain.  °· Many cases can be observed and treated at home. °· Over-the-counter medicines recommended by your caregiver. °· Prescription medicine. °· Antibiotics, for infection. °· Birth control pills, for painful periods or for ovulation pain. °· Hormone treatment, for endometriosis. °· Nerve blocking injections. °· Physical therapy. °· Antidepressants. °· Counseling with a psychologist or psychiatrist. °· Minor or major surgery. °HOME CARE INSTRUCTIONS  °· Do not take laxatives, unless directed by your caregiver. °· Take over-the-counter pain medicine only if ordered by your caregiver. Do not take aspirin because it can cause an upset stomach or bleeding. °· Try a clear liquid diet (broth or water) as ordered by your caregiver. Slowly move to a bland diet, as tolerated, if the pain is related to the stomach or intestine. °· Have a thermometer and take your temperature several times a day, and record it. °· Bed rest and sleep, if it helps the pain. °· Avoid sexual intercourse, if it causes pain. °· Avoid stressful situations. °· Keep your follow-up appointments and tests, as your caregiver orders. °· If the pain does not go away with medicine or surgery, you may try: °¨ Acupuncture. °¨ Relaxation exercises (yoga, meditation). °¨ Group therapy. °¨ Counseling. °SEEK MEDICAL CARE IF:  °· You notice certain foods cause stomach pain. °· Your home care treatment is not helping your pain. °· You need stronger pain medicine. °· You want your IUD removed. °· You feel faint or   lightheaded. °· You develop nausea and vomiting. °· You develop a rash. °· You are having side effects or an allergy to your medicine. °SEEK IMMEDIATE MEDICAL CARE IF:  °· Your  pain does not go away or gets worse. °· You have a fever. °· Your pain is felt only in portions of the abdomen. The right side could possibly be appendicitis. The left lower portion of the abdomen could be colitis or diverticulitis. °· You are passing blood in your stools (bright red or black tarry stools, with or without vomiting). °· You have blood in your urine. °· You develop chills, with or without a fever. °· You pass out. °MAKE SURE YOU:  °· Understand these instructions. °· Will watch your condition. °· Will get help right away if you are not doing well or get worse. °Document Released: 05/28/2007 Document Revised: 12/15/2013 Document Reviewed: 06/17/2009 °ExitCare® Patient Information ©2015 [employer]. This information is not intended to replace advice given to you by your health care provider. Make sure you discuss any questions you have with your health care provider. ° °

## 2014-07-08 NOTE — ED Notes (Signed)
C/o lower abd/pelvic pain x "couple weeks"-recent GB removal

## 2014-07-09 DIAGNOSIS — R002 Palpitations: Secondary | ICD-10-CM | POA: Diagnosis not present

## 2014-07-09 DIAGNOSIS — I472 Ventricular tachycardia: Secondary | ICD-10-CM | POA: Diagnosis not present

## 2014-07-11 DIAGNOSIS — K219 Gastro-esophageal reflux disease without esophagitis: Secondary | ICD-10-CM | POA: Diagnosis not present

## 2014-07-11 DIAGNOSIS — G35 Multiple sclerosis: Secondary | ICD-10-CM | POA: Diagnosis not present

## 2014-07-11 DIAGNOSIS — Z7952 Long term (current) use of systemic steroids: Secondary | ICD-10-CM | POA: Diagnosis not present

## 2014-07-11 DIAGNOSIS — Z882 Allergy status to sulfonamides status: Secondary | ICD-10-CM | POA: Diagnosis not present

## 2014-07-11 DIAGNOSIS — R0602 Shortness of breath: Secondary | ICD-10-CM | POA: Diagnosis not present

## 2014-07-11 DIAGNOSIS — J45909 Unspecified asthma, uncomplicated: Secondary | ICD-10-CM | POA: Diagnosis not present

## 2014-07-11 DIAGNOSIS — T887XXA Unspecified adverse effect of drug or medicament, initial encounter: Secondary | ICD-10-CM | POA: Diagnosis not present

## 2014-07-11 DIAGNOSIS — Z91018 Allergy to other foods: Secondary | ICD-10-CM | POA: Diagnosis not present

## 2014-07-11 DIAGNOSIS — Z87892 Personal history of anaphylaxis: Secondary | ICD-10-CM | POA: Diagnosis not present

## 2014-07-11 DIAGNOSIS — Z888 Allergy status to other drugs, medicaments and biological substances status: Secondary | ICD-10-CM | POA: Diagnosis not present

## 2014-07-11 DIAGNOSIS — J9801 Acute bronchospasm: Secondary | ICD-10-CM | POA: Diagnosis not present

## 2014-07-11 DIAGNOSIS — E785 Hyperlipidemia, unspecified: Secondary | ICD-10-CM | POA: Diagnosis not present

## 2014-07-11 DIAGNOSIS — R0600 Dyspnea, unspecified: Secondary | ICD-10-CM | POA: Diagnosis not present

## 2014-07-11 DIAGNOSIS — I10 Essential (primary) hypertension: Secondary | ICD-10-CM | POA: Diagnosis not present

## 2014-07-11 DIAGNOSIS — Z79899 Other long term (current) drug therapy: Secondary | ICD-10-CM | POA: Diagnosis not present

## 2014-07-11 DIAGNOSIS — F419 Anxiety disorder, unspecified: Secondary | ICD-10-CM | POA: Diagnosis not present

## 2014-07-11 DIAGNOSIS — Z881 Allergy status to other antibiotic agents status: Secondary | ICD-10-CM | POA: Diagnosis not present

## 2014-07-11 DIAGNOSIS — Z7951 Long term (current) use of inhaled steroids: Secondary | ICD-10-CM | POA: Diagnosis not present

## 2014-07-11 DIAGNOSIS — Z880 Allergy status to penicillin: Secondary | ICD-10-CM | POA: Diagnosis not present

## 2014-07-11 DIAGNOSIS — F17211 Nicotine dependence, cigarettes, in remission: Secondary | ICD-10-CM | POA: Diagnosis not present

## 2014-07-11 DIAGNOSIS — Z885 Allergy status to narcotic agent status: Secondary | ICD-10-CM | POA: Diagnosis not present

## 2014-07-12 DIAGNOSIS — R0602 Shortness of breath: Secondary | ICD-10-CM | POA: Diagnosis not present

## 2014-07-12 DIAGNOSIS — M7989 Other specified soft tissue disorders: Secondary | ICD-10-CM | POA: Diagnosis not present

## 2014-07-12 DIAGNOSIS — R140 Abdominal distension (gaseous): Secondary | ICD-10-CM | POA: Diagnosis not present

## 2014-07-13 ENCOUNTER — Ambulatory Visit (INDEPENDENT_AMBULATORY_CARE_PROVIDER_SITE_OTHER): Payer: Medicare Other | Admitting: Family Medicine

## 2014-07-13 ENCOUNTER — Encounter: Payer: Self-pay | Admitting: Family Medicine

## 2014-07-13 VITALS — BP 151/94 | HR 111 | Wt 192.0 lb

## 2014-07-13 DIAGNOSIS — R635 Abnormal weight gain: Secondary | ICD-10-CM | POA: Diagnosis not present

## 2014-07-13 DIAGNOSIS — J328 Other chronic sinusitis: Secondary | ICD-10-CM | POA: Diagnosis not present

## 2014-07-13 DIAGNOSIS — G35 Multiple sclerosis: Secondary | ICD-10-CM

## 2014-07-13 DIAGNOSIS — E876 Hypokalemia: Secondary | ICD-10-CM

## 2014-07-13 DIAGNOSIS — I10 Essential (primary) hypertension: Secondary | ICD-10-CM | POA: Diagnosis not present

## 2014-07-13 DIAGNOSIS — R140 Abdominal distension (gaseous): Secondary | ICD-10-CM

## 2014-07-13 MED ORDER — HYDROCHLOROTHIAZIDE 12.5 MG PO TABS: 12.5000 mg | ORAL_TABLET | Freq: Every day | ORAL | Status: DC

## 2014-07-13 MED ORDER — LOSARTAN POTASSIUM 25 MG PO TABS: 25.0000 mg | ORAL_TABLET | Freq: Every day | ORAL | Status: DC

## 2014-07-13 NOTE — Progress Notes (Signed)
   Subjective:    Patient ID: Jennifer [name], female    DOB: [birth year], 48 y.o.   MRN: [id number]  HPI Se is upset today bc got a letter for [facility]. They have discharged her bc they couldn't get in touch.    She doesn't want to take the metoprolol. Has been more SOB even off the medication.  She is willing to try something else but wants to avoid beta blockers if at all possible.  Planning on starting new medication for MS, next Monday.  She is very nervous about this.  She is upset about her weight increasing. She is still feeling bloating and has been retaining fluid in her hands and stomach.    Still having a lot of sinus symptoms including congestion and intermittent stuffiness. No fevers chills or sweats. She has been noticing some bloody and occasionally green nasal discharge. Though sometimes is just white. She did start using her nasal rinses again as well as started her nasal steroid spray again about 2 days ago.  Review of Systems     Objective:   Physical Exam  Constitutional: She is oriented to person, place, and time. She appears well-developed and well-nourished.  HENT:  Head: Normocephalic and atraumatic.  Right Ear: External ear normal.  Left Ear: External ear normal.  Nose: Nose normal.  Mouth/Throat: Oropharynx is clear and moist.  TMs and canals are clear.   Eyes: Conjunctivae and EOM are normal. Pupils are equal, round, and reactive to light.  Neck: Neck supple. No thyromegaly present.  Cardiovascular: Normal rate, regular rhythm and normal heart sounds.   Pulmonary/Chest: Effort normal and breath sounds normal. She has no wheezes.  Lymphadenopathy:    She has no cervical adenopathy.  Neurological: She is alert and oriented to person, place, and time.  Skin: Skin is warm and dry.  Psychiatric: She has a normal mood and affect.          Assessment & Plan:  Hypokalemia - will recheck potassium this week. She had it checked over the weekend  and it dropped slightly when she went to the emergency department. We may need to consider putting her on daily potassium supplementation.  Hypertension-do not think she's tried losartan before. She did develop a cough with lisinopril. Discussed that this will not help with the palpitations but I don't think she's going to be able tolerate a beta blocker or calcium channel blocker at this point in time so we will just focus on trying to get her blood pressure down overall.  Weight gain - discussed seeing calorie goals. Recommend use My Fitness pal.  Even the basic version which is free should help guide her as far as caloric intake goals. I know she's not able to exercise regularly at this point.  Bloating/edema - Can use the hctz 12.5mg  PRN.  Maybe once a week. Just make sure to take potassium with it. Still has her liquid potassium at home.   Chronic sinusitis - continue with nasal saline and nasal steroid. IF not better in 3-4 days then call the office.  Or call sooner if develops a fever chills or sweats or increasing pain.

## 2014-07-13 NOTE — Patient Instructions (Signed)
Low-Sodium Eating Plan °Sodium raises blood pressure and causes water to be held in the body. Getting less sodium from food will help lower your blood pressure, reduce any swelling, and protect your heart, liver, and kidneys. We get sodium by adding salt (sodium chloride) to food. Most of our sodium comes from canned, boxed, and frozen foods. Restaurant foods, fast foods, and pizza are also very high in sodium. Even if you take medicine to lower your blood pressure or to reduce fluid in your body, getting less sodium from your food is important. °WHAT IS MY PLAN? °Most people should limit their sodium intake to 2,300 mg a day. Your health care provider recommends that you limit your sodium intake to __________ a day.  °WHAT DO I NEED TO KNOW ABOUT THIS EATING PLAN? °For the low-sodium eating plan, you will follow these general guidelines: °· Choose foods with a % Daily Value for sodium of less than 5% (as listed on the food label).   °· Use salt-free seasonings or herbs instead of table salt or sea salt.   °· Check with your health care provider or pharmacist before using salt substitutes.   °· Eat fresh foods. °· Eat more vegetables and fruits. °· Limit canned vegetables. If you do use them, rinse them well to decrease the sodium.   °· Limit cheese to 1 oz (28 g) per day.    °· Eat lower-sodium products, often labeled as "lower sodium" or "no salt added." °· Avoid foods that contain monosodium glutamate (MSG). MSG is sometimes added to Chinese food and some canned foods.   °· Check food labels (Nutrition Facts labels) on foods to learn how much sodium is in one serving. °· Eat more home-cooked food and less restaurant, buffet, and fast food.  °· When eating at a restaurant, ask that your food be prepared with less salt or none, if possible.   °HOW DO I READ FOOD LABELS FOR SODIUM INFORMATION? °The Nutrition Facts label lists the amount of sodium in one serving of the food. If you eat more than one serving, you must  multiply the listed amount of sodium by the number of servings. °Food labels may also identify foods as: °· Sodium free--Less than 5 mg in a serving. °· Very low sodium--35 mg or less in a serving. °· Low sodium--140 mg or less in a serving. °· Light in sodium--50% less sodium in a serving. For example, if a food that usually has 300 mg of sodium is changed to become light in sodium, it will have 150 mg of sodium. °· Reduced sodium--25% less sodium in a serving. For example, if a food that usually has 400 mg of sodium is changed to reduced sodium, it will have 300 mg of sodium. °WHAT FOODS CAN I EAT? °Grains  °Low-sodium cereals, including oats, puffed wheat and rice, and shredded wheat cereals. Low-sodium crackers. Unsalted rice and pasta. Lower-sodium bread.  °Vegetables  °Frozen or fresh vegetables. Low-sodium or reduced-sodium canned vegetables. Low-sodium or reduced-sodium tomato sauce and paste. Low-sodium or reduced-sodium tomato and vegetable juices.  °Fruits  °Fresh, frozen, and canned fruit. Fruit juice.  °Meat and Other Protein Products  °Low-sodium canned tuna and salmon. Fresh or frozen meat, poultry, seafood, and fish. Lamb. Unsalted nuts. Dried beans, peas, and lentils without added salt. Unsalted canned beans. Homemade soups without salt. Eggs.  °Dairy  °Milk. Soy milk. Ricotta cheese. Low-sodium or reduced-sodium cheeses. Yogurt.  °Condiments  °Fresh and dried herbs and spices. Salt-free seasonings. Onion and garlic powders. Low-sodium varieties of mustard and ketchup. Lemon juice.  °Fats and Oils   °  Reduced-sodium salad dressings. Unsalted butter.   °Other  °Unsalted popcorn and pretzels.  °The items listed above may not be a complete list of recommended foods or beverages. Contact your dietitian for more options. °WHAT FOODS ARE NOT RECOMMENDED? °Grains  °Instant hot cereals. Bread stuffing, pancake, and biscuit mixes. Croutons. Seasoned rice or pasta mixes. Noodle soup cups. Boxed or frozen  macaroni and cheese. Self-rising flour. Regular salted crackers. °Vegetables  °Regular canned vegetables. Regular canned tomato sauce and paste. Regular tomato and vegetable juices. Frozen vegetables in sauces. Salted french fries. Olives. Pickles. Relishes. Sauerkraut. Salsa. °Meat and Other Protein Products  °Salted, canned, smoked, spiced, or pickled meats, seafood, or fish. Bacon, ham, sausage, hot dogs, corned beef, chipped beef, and packaged luncheon meats. Salt pork. Jerky. Pickled herring. Anchovies, regular canned tuna, and sardines. Salted nuts. °Dairy  °Processed cheese and cheese spreads. Cheese curds. Blue cheese and cottage cheese. Buttermilk.  °Condiments  °Onion and garlic salt, seasoned salt, table salt, and sea salt. Canned and packaged gravies. Worcestershire sauce. Tartar sauce. Barbecue sauce. Teriyaki sauce. Soy sauce, including reduced sodium. Steak sauce. Fish sauce. Oyster sauce. Cocktail sauce. Horseradish. Regular ketchup and mustard. Meat flavorings and tenderizers. Bouillon cubes. Hot sauce. Tabasco sauce. Marinades. Taco seasonings. Relishes. °Fats and Oils   °Regular salad dressings. Salted butter. Margarine. Ghee. Bacon fat.  °Other  °Potato and tortilla chips. Corn chips and puffs. Salted popcorn and pretzels. Canned or dried soups. Pizza. Frozen entrees and pot pies.   °The items listed above may not be a complete list of foods and beverages to avoid. Contact your dietitian for more information. °Document Released: 01/20/2002 Document Revised: 08/05/2013 Document Reviewed: 06/04/2013 °ExitCare® Patient Information ©2015 [employer]. This information is not intended to replace advice given to you by your health care provider. Make sure you discuss any questions you have with your health care provider. ° °

## 2014-07-14 ENCOUNTER — Encounter: Payer: Self-pay | Admitting: Family Medicine

## 2014-07-14 DIAGNOSIS — R921 Mammographic calcification found on diagnostic imaging of breast: Secondary | ICD-10-CM | POA: Diagnosis not present

## 2014-07-14 DIAGNOSIS — R928 Other abnormal and inconclusive findings on diagnostic imaging of breast: Secondary | ICD-10-CM | POA: Diagnosis not present

## 2014-07-14 DIAGNOSIS — E876 Hypokalemia: Secondary | ICD-10-CM | POA: Diagnosis not present

## 2014-07-14 LAB — BASIC METABOLIC PANEL
Calcium: 8.8 mg/dL
Chloride: 107 mmol/L
Creatinine: 0.6 mg/dL (ref 0.5–1.1)
Glucose: 117 mg/dL
Potassium: 3.9 mmol/L (ref 3.4–5.3)

## 2014-07-15 ENCOUNTER — Ambulatory Visit: Payer: Medicare Other | Admitting: Family Medicine

## 2014-07-15 ENCOUNTER — Telehealth: Payer: Self-pay | Admitting: Family Medicine

## 2014-07-15 ENCOUNTER — Telehealth: Payer: Self-pay | Admitting: *Deleted

## 2014-07-15 DIAGNOSIS — R002 Palpitations: Secondary | ICD-10-CM | POA: Diagnosis not present

## 2014-07-15 DIAGNOSIS — R072 Precordial pain: Secondary | ICD-10-CM | POA: Diagnosis not present

## 2014-07-15 DIAGNOSIS — F459 Somatoform disorder, unspecified: Secondary | ICD-10-CM | POA: Diagnosis not present

## 2014-07-15 DIAGNOSIS — R110 Nausea: Secondary | ICD-10-CM | POA: Diagnosis not present

## 2014-07-15 DIAGNOSIS — I10 Essential (primary) hypertension: Secondary | ICD-10-CM | POA: Diagnosis not present

## 2014-07-15 DIAGNOSIS — K760 Fatty (change of) liver, not elsewhere classified: Secondary | ICD-10-CM | POA: Diagnosis not present

## 2014-07-15 DIAGNOSIS — G894 Chronic pain syndrome: Secondary | ICD-10-CM | POA: Diagnosis not present

## 2014-07-15 DIAGNOSIS — R0789 Other chest pain: Secondary | ICD-10-CM | POA: Diagnosis not present

## 2014-07-15 DIAGNOSIS — R079 Chest pain, unspecified: Secondary | ICD-10-CM | POA: Diagnosis not present

## 2014-07-15 DIAGNOSIS — J45909 Unspecified asthma, uncomplicated: Secondary | ICD-10-CM | POA: Diagnosis not present

## 2014-07-15 DIAGNOSIS — G4730 Sleep apnea, unspecified: Secondary | ICD-10-CM | POA: Diagnosis not present

## 2014-07-15 DIAGNOSIS — R0602 Shortness of breath: Secondary | ICD-10-CM | POA: Diagnosis not present

## 2014-07-15 DIAGNOSIS — R9439 Abnormal result of other cardiovascular function study: Secondary | ICD-10-CM | POA: Diagnosis not present

## 2014-07-15 DIAGNOSIS — Z8673 Personal history of transient ischemic attack (TIA), and cerebral infarction without residual deficits: Secondary | ICD-10-CM | POA: Diagnosis not present

## 2014-07-15 DIAGNOSIS — Z79899 Other long term (current) drug therapy: Secondary | ICD-10-CM | POA: Diagnosis not present

## 2014-07-15 NOTE — Telephone Encounter (Signed)
I called and left a voicemail [phone]) relaying her potassium level and to recheck her bw in 1 week. [name], CMA

## 2014-07-15 NOTE — Telephone Encounter (Signed)
Please call patient: I did get a copy of her blood work from [facility]. Her potassium looks good at 3.9. Does go ahead and have her start daily potassium. Please verify with her what prescription she already has at home area recommend a single dose daily and then we can recheck her potassium in one week to make sure it's not going too high.

## 2014-07-15 NOTE — Telephone Encounter (Signed)
Jennifer [name] calls this morning that she is having a heart cath on Monday at [facility]. She reports that the cardiologist found abnormal blood flow in her heart. She also said that she was contacting the director at the lab at [facility] that they failed to notifiy [place] of her potassium results. [name], CMA

## 2014-07-16 DIAGNOSIS — R079 Chest pain, unspecified: Secondary | ICD-10-CM | POA: Diagnosis not present

## 2014-07-16 DIAGNOSIS — E269 Hyperaldosteronism, unspecified: Secondary | ICD-10-CM | POA: Diagnosis not present

## 2014-07-16 DIAGNOSIS — R072 Precordial pain: Secondary | ICD-10-CM | POA: Diagnosis not present

## 2014-07-20 DIAGNOSIS — G35 Multiple sclerosis: Secondary | ICD-10-CM | POA: Diagnosis not present

## 2014-07-20 DIAGNOSIS — Z87820 Personal history of traumatic brain injury: Secondary | ICD-10-CM | POA: Diagnosis not present

## 2014-07-20 DIAGNOSIS — R7309 Other abnormal glucose: Secondary | ICD-10-CM | POA: Diagnosis not present

## 2014-07-20 DIAGNOSIS — G8929 Other chronic pain: Secondary | ICD-10-CM | POA: Diagnosis not present

## 2014-07-20 DIAGNOSIS — I721 Aneurysm of artery of upper extremity: Secondary | ICD-10-CM | POA: Diagnosis not present

## 2014-07-20 DIAGNOSIS — K760 Fatty (change of) liver, not elsewhere classified: Secondary | ICD-10-CM | POA: Diagnosis not present

## 2014-07-20 DIAGNOSIS — R0602 Shortness of breath: Secondary | ICD-10-CM | POA: Diagnosis not present

## 2014-07-20 DIAGNOSIS — M79621 Pain in right upper arm: Secondary | ICD-10-CM | POA: Diagnosis not present

## 2014-07-20 DIAGNOSIS — I10 Essential (primary) hypertension: Secondary | ICD-10-CM | POA: Diagnosis not present

## 2014-07-20 DIAGNOSIS — M79601 Pain in right arm: Secondary | ICD-10-CM | POA: Diagnosis not present

## 2014-07-21 ENCOUNTER — Ambulatory Visit (INDEPENDENT_AMBULATORY_CARE_PROVIDER_SITE_OTHER): Payer: Medicare Other

## 2014-07-21 ENCOUNTER — Encounter: Payer: Self-pay | Admitting: Family Medicine

## 2014-07-21 ENCOUNTER — Telehealth: Payer: Self-pay | Admitting: *Deleted

## 2014-07-21 ENCOUNTER — Ambulatory Visit (INDEPENDENT_AMBULATORY_CARE_PROVIDER_SITE_OTHER): Payer: Medicare Other | Admitting: Family Medicine

## 2014-07-21 ENCOUNTER — Other Ambulatory Visit: Payer: Self-pay | Admitting: Family Medicine

## 2014-07-21 VITALS — BP 135/89 | HR 108 | Temp 98.2°F | Wt 192.0 lb

## 2014-07-21 DIAGNOSIS — J329 Chronic sinusitis, unspecified: Secondary | ICD-10-CM | POA: Diagnosis not present

## 2014-07-21 DIAGNOSIS — J0100 Acute maxillary sinusitis, unspecified: Secondary | ICD-10-CM | POA: Diagnosis not present

## 2014-07-21 DIAGNOSIS — R0982 Postnasal drip: Secondary | ICD-10-CM | POA: Diagnosis not present

## 2014-07-21 MED ORDER — ATENOLOL 25 MG PO TABS: 12.5000 mg | ORAL_TABLET | Freq: Every day | ORAL | Status: DC

## 2014-07-21 MED ORDER — CLINDAMYCIN HCL 150 MG PO CAPS: 150.0000 mg | ORAL_CAPSULE | Freq: Two times a day (BID) | ORAL | Status: DC

## 2014-07-21 NOTE — Telephone Encounter (Signed)
Pt stated that she is having a lot of sinus drainage and throat pain. She would like for Dr. Madilyn Fireman order an xray of her sinus. She stated that she has a lot of thick white mucus, and her throat is hurting. She has been scheduled for today @ 145.[name] [place]

## 2014-07-21 NOTE — Progress Notes (Signed)
   Subjective:    Patient ID: Jennifer [name], female    DOB: Jan 02, 1966, 48 y.o.   MRN: [id number]  HPI Patient last seen 10 days ago.  She had discussed sinus congestion / She has started using her nasal sinus rinse and had started nasal steroid spray at that time. Told her ot come back if not better. Says getting thick white mucous. No has a ST. Painful with swallowing. Using some robitussion.  She feels really tired. No fever, chills.  She feels a lot of pressure in her posterior sinuses.  Says she feel slike has had sxs for seveal week snow.  Not running humidifier.   Not on antihistamines.  No tooth pain.  + HA and lightheaded.  Facial pressure bilaterally.     Review of Systems     Objective:   Physical Exam  Constitutional: She is oriented to person, place, and time. She appears well-developed and well-nourished.  HENT:  Head: Normocephalic and atraumatic.  Right Ear: External ear normal.  Left Ear: External ear normal.  Nose: Nose normal.  Mouth/Throat: Oropharynx is clear and moist.  TMs and canals are clear.   Eyes: Conjunctivae and EOM are normal. Pupils are equal, round, and reactive to light.  Neck: Neck supple. No thyromegaly present.  Cardiovascular: Normal rate, regular rhythm and normal heart sounds.   Pulmonary/Chest: Effort normal and breath sounds normal. She has no wheezes.  Lymphadenopathy:    She has no cervical adenopathy.  Neurological: She is alert and oriented to person, place, and time.  Skin: Skin is warm and dry.  Psychiatric: She has a normal mood and affect.          Assessment & Plan:  Chronic sinusitis - she really wants an xrya of her sinuses.  We'll order today. Suspect chronic sinusitis. Unfortunately she has multiple intolerances to drugs.will tx with low dose of clinda which she usually does ok.  Will see her back in about 10 days to make sure she is improving.  Continue with sinus rinses and nasal steroid spray. Recommend he may find the  air in her home.

## 2014-07-22 DIAGNOSIS — J312 Chronic pharyngitis: Secondary | ICD-10-CM | POA: Diagnosis not present

## 2014-07-22 DIAGNOSIS — R1314 Dysphagia, pharyngoesophageal phase: Secondary | ICD-10-CM | POA: Diagnosis not present

## 2014-07-22 DIAGNOSIS — J328 Other chronic sinusitis: Secondary | ICD-10-CM | POA: Diagnosis not present

## 2014-07-22 DIAGNOSIS — R05 Cough: Secondary | ICD-10-CM | POA: Diagnosis not present

## 2014-07-23 ENCOUNTER — Ambulatory Visit: Payer: Medicare Other | Admitting: Family Medicine

## 2014-07-24 ENCOUNTER — Emergency Department (HOSPITAL_BASED_OUTPATIENT_CLINIC_OR_DEPARTMENT_OTHER)
Admission: EM | Admit: 2014-07-24 | Discharge: 2014-07-24 | Disposition: A | Discharge status: Home / Self Care | Payer: Medicare Other | Attending: Emergency Medicine | Admitting: Emergency Medicine

## 2014-07-24 ENCOUNTER — Encounter (HOSPITAL_BASED_OUTPATIENT_CLINIC_OR_DEPARTMENT_OTHER): Payer: Self-pay

## 2014-07-24 ENCOUNTER — Encounter: Payer: Self-pay | Admitting: Family Medicine

## 2014-07-24 ENCOUNTER — Emergency Department (HOSPITAL_BASED_OUTPATIENT_CLINIC_OR_DEPARTMENT_OTHER): Payer: Medicare Other

## 2014-07-24 DIAGNOSIS — Z8542 Personal history of malignant neoplasm of other parts of uterus: Secondary | ICD-10-CM | POA: Insufficient documentation

## 2014-07-24 DIAGNOSIS — Z8639 Personal history of other endocrine, nutritional and metabolic disease: Secondary | ICD-10-CM | POA: Insufficient documentation

## 2014-07-24 DIAGNOSIS — R5383 Other fatigue: Secondary | ICD-10-CM | POA: Insufficient documentation

## 2014-07-24 DIAGNOSIS — R630 Anorexia: Secondary | ICD-10-CM | POA: Insufficient documentation

## 2014-07-24 DIAGNOSIS — Z79899 Other long term (current) drug therapy: Secondary | ICD-10-CM | POA: Diagnosis not present

## 2014-07-24 DIAGNOSIS — Z792 Long term (current) use of antibiotics: Secondary | ICD-10-CM | POA: Insufficient documentation

## 2014-07-24 DIAGNOSIS — R0789 Other chest pain: Secondary | ICD-10-CM | POA: Diagnosis not present

## 2014-07-24 DIAGNOSIS — Z880 Allergy status to penicillin: Secondary | ICD-10-CM | POA: Diagnosis not present

## 2014-07-24 DIAGNOSIS — R531 Weakness: Secondary | ICD-10-CM | POA: Insufficient documentation

## 2014-07-24 DIAGNOSIS — J45901 Unspecified asthma with (acute) exacerbation: Secondary | ICD-10-CM | POA: Diagnosis not present

## 2014-07-24 DIAGNOSIS — Z862 Personal history of diseases of the blood and blood-forming organs and certain disorders involving the immune mechanism: Secondary | ICD-10-CM | POA: Diagnosis not present

## 2014-07-24 DIAGNOSIS — K219 Gastro-esophageal reflux disease without esophagitis: Secondary | ICD-10-CM | POA: Insufficient documentation

## 2014-07-24 DIAGNOSIS — Z8544 Personal history of malignant neoplasm of other female genital organs: Secondary | ICD-10-CM | POA: Insufficient documentation

## 2014-07-24 DIAGNOSIS — Z8739 Personal history of other diseases of the musculoskeletal system and connective tissue: Secondary | ICD-10-CM | POA: Insufficient documentation

## 2014-07-24 DIAGNOSIS — Z87891 Personal history of nicotine dependence: Secondary | ICD-10-CM | POA: Diagnosis not present

## 2014-07-24 DIAGNOSIS — G8929 Other chronic pain: Secondary | ICD-10-CM | POA: Diagnosis not present

## 2014-07-24 DIAGNOSIS — R079 Chest pain, unspecified: Secondary | ICD-10-CM | POA: Diagnosis not present

## 2014-07-24 DIAGNOSIS — Z8614 Personal history of Methicillin resistant Staphylococcus aureus infection: Secondary | ICD-10-CM | POA: Diagnosis not present

## 2014-07-24 DIAGNOSIS — I10 Essential (primary) hypertension: Secondary | ICD-10-CM | POA: Insufficient documentation

## 2014-07-24 DIAGNOSIS — F4310 Post-traumatic stress disorder, unspecified: Secondary | ICD-10-CM | POA: Diagnosis not present

## 2014-07-24 DIAGNOSIS — G4730 Sleep apnea, unspecified: Secondary | ICD-10-CM | POA: Diagnosis not present

## 2014-07-24 DIAGNOSIS — Z9981 Dependence on supplemental oxygen: Secondary | ICD-10-CM | POA: Insufficient documentation

## 2014-07-24 DIAGNOSIS — R110 Nausea: Secondary | ICD-10-CM | POA: Diagnosis not present

## 2014-07-24 DIAGNOSIS — R072 Precordial pain: Secondary | ICD-10-CM | POA: Diagnosis not present

## 2014-07-24 LAB — CBC WITH DIFFERENTIAL/PLATELET
Basophils Absolute: 0 10*3/uL (ref 0.0–0.1)
Basophils Relative: 0 % (ref 0–1)
Eosinophils Absolute: 0.2 10*3/uL (ref 0.0–0.7)
Eosinophils Relative: 3 % (ref 0–5)
HCT: 40.4 % (ref 36.0–46.0)
Hemoglobin: 13 g/dL (ref 12.0–15.0)
Lymphocytes Relative: 26 % (ref 12–46)
Lymphs Abs: 1.8 10*3/uL (ref 0.7–4.0)
MCH: 28.3 pg (ref 26.0–34.0)
MCHC: 32.2 g/dL (ref 30.0–36.0)
MCV: 88 fL (ref 78.0–100.0)
Monocytes Absolute: 0.6 10*3/uL (ref 0.1–1.0)
Monocytes Relative: 8 % (ref 3–12)
Neutro Abs: 4.5 10*3/uL (ref 1.7–7.7)
Neutrophils Relative %: 63 % (ref 43–77)
Platelets: 229 10*3/uL (ref 150–400)
RBC: 4.59 MIL/uL (ref 3.87–5.11)
RDW: 14.3 % (ref 11.5–15.5)
WBC: 7.1 10*3/uL (ref 4.0–10.5)

## 2014-07-24 LAB — LIPASE, BLOOD: Lipase: 49 U/L (ref 11–59)

## 2014-07-24 LAB — COMPREHENSIVE METABOLIC PANEL
ALT: 33 U/L (ref 0–35)
AST: 16 U/L (ref 0–37)
Albumin: 3.9 g/dL (ref 3.5–5.2)
Alkaline Phosphatase: 103 U/L (ref 39–117)
Anion gap: 17 — ABNORMAL HIGH (ref 5–15)
BUN: 11 mg/dL (ref 6–23)
CO2: 23 mEq/L (ref 19–32)
Calcium: 9.2 mg/dL (ref 8.4–10.5)
Chloride: 105 mEq/L (ref 96–112)
Creatinine, Ser: 0.5 mg/dL (ref 0.50–1.10)
GFR calc Af Amer: >90 mL/min (ref >90)
GFR calc non Af Amer: >90 mL/min (ref >90)
Glucose, Bld: 87 mg/dL (ref 70–99)
Potassium: 3.9 mEq/L (ref 3.7–5.3)
Sodium: 145 mEq/L (ref 137–147)
Total Bilirubin: <0.2 mg/dL — ABNORMAL LOW (ref 0.3–1.2)
Total Protein: 7.8 g/dL (ref 6.0–8.3)

## 2014-07-24 LAB — URINALYSIS, ROUTINE W REFLEX MICROSCOPIC
Bilirubin Urine: NEGATIVE
Glucose, UA: NEGATIVE mg/dL
Hgb urine dipstick: NEGATIVE
Ketones, ur: NEGATIVE mg/dL
Leukocytes, UA: NEGATIVE
Nitrite: NEGATIVE
Protein, ur: NEGATIVE mg/dL
Specific Gravity, Urine: 1.016 (ref 1.005–1.030)
Urobilinogen, UA: 0.2 mg/dL (ref 0.0–1.0)
pH: 6.5 (ref 5.0–8.0)

## 2014-07-24 LAB — TROPONIN I
Troponin I: <0.3 ng/mL (ref <0.30)
Troponin I: <0.3 ng/mL (ref <0.30)

## 2014-07-24 LAB — D-DIMER, QUANTITATIVE (NOT AT ARMC): D-Dimer, Quant: <0.27 ug/mL-FEU (ref 0.00–0.48)

## 2014-07-24 MED ORDER — ONDANSETRON 4 MG PO TBDP
4.0000 mg | ORAL_TABLET | Freq: Once | ORAL | Status: AC
  Administered 2014-07-24: 4 mg via ORAL
  Filled 2014-07-24: qty 1

## 2014-07-24 MED ORDER — GI COCKTAIL ~~LOC~~
30.0000 mL | Freq: Once | ORAL | Status: DC
  Filled 2014-07-24: qty 30
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
## 2014-07-24 NOTE — ED Notes (Signed)
Pt states her whole body hurts and feels a pressure on her chest as she walks. Pt feels the same way seat and walking.

## 2014-07-24 NOTE — ED Notes (Addendum)
Pt reports chest pain that started this am at 0800.  Pain is left chest wall to midsternal radiating to left arm and jaw.  "I just felt bad and weak today".  Took a nap then still felt ill and came to ED for evaluation  Worst 8/10, now 7/10.  S/P normal heart cath 7 days ago (Dr. Donnetta Hutching)

## 2014-07-24 NOTE — Discharge Instructions (Signed)
Chest Pain (Nonspecific) There is no evidence of heart attack or blood clot in her lung. Follow-up for your lung function test next week as scheduled. Return to the ED if you develop new or worsening symptoms. It is often hard to give a specific diagnosis for the cause of chest pain. There is always a chance that your pain could be related to something serious, such as a heart attack or a blood clot in the lungs. You need to follow up with your health care provider for further evaluation. CAUSES   Heartburn.  Pneumonia or bronchitis.  Anxiety or stress.  Inflammation around your heart (pericarditis) or lung (pleuritis or pleurisy).  A blood clot in the lung.  A collapsed lung (pneumothorax). It can develop suddenly on its own (spontaneous pneumothorax) or from trauma to the chest.  Shingles infection (herpes zoster virus). The chest wall is composed of bones, muscles, and cartilage. Any of these can be the source of the pain.  The bones can be bruised by injury.  The muscles or cartilage can be strained by coughing or overwork.  The cartilage can be affected by inflammation and become sore (costochondritis). DIAGNOSIS  Lab tests or other studies may be needed to find the cause of your pain. Your health care provider may have you take a test called an ambulatory electrocardiogram (ECG). An ECG records your heartbeat patterns over a 24-hour period. You may also have other tests, such as:  Transthoracic echocardiogram (TTE). During echocardiography, sound waves are used to evaluate how blood flows through your heart.  Transesophageal echocardiogram (TEE).  Cardiac monitoring. This allows your health care provider to monitor your heart rate and rhythm in real time.  Holter monitor. This is a portable device that records your heartbeat and can help diagnose heart arrhythmias. It allows your health care provider to track your heart activity for several days, if needed.  Stress tests by  exercise or by giving medicine that makes the heart beat faster. TREATMENT   Treatment depends on what may be causing your chest pain. Treatment may include:  Acid blockers for heartburn.  Anti-inflammatory medicine.  Pain medicine for inflammatory conditions.  Antibiotics if an infection is present.  You may be advised to change lifestyle habits. This includes stopping smoking and avoiding alcohol, caffeine, and chocolate.  You may be advised to keep your head raised (elevated) when sleeping. This reduces the chance of acid going backward from your stomach into your esophagus. Most of the time, nonspecific chest pain will improve within 2-3 days with rest and mild pain medicine.  HOME CARE INSTRUCTIONS   If antibiotics were prescribed, take them as directed. Finish them even if you start to feel better.  For the next few days, avoid physical activities that bring on chest pain. Continue physical activities as directed.  Do not use any tobacco products, including cigarettes, chewing tobacco, or electronic cigarettes.  Avoid drinking alcohol.  Only take medicine as directed by your health care provider.  Follow your health care provider's suggestions for further testing if your chest pain does not go away.  Keep any follow-up appointments you made. If you do not go to an appointment, you could develop lasting (chronic) problems with pain. If there is any problem keeping an appointment, call to reschedule. SEEK MEDICAL CARE IF:   Your chest pain does not go away, even after treatment.  You have a rash with blisters on your chest.  You have a fever. SEEK IMMEDIATE MEDICAL CARE IF:  You have increased chest pain or pain that spreads to your arm, neck, jaw, back, or abdomen.  You have shortness of breath.  You have an increasing cough, or you cough up blood.  You have severe back or abdominal pain.  You feel nauseous or vomit.  You have severe weakness.  You  faint.  You have chills. This is an emergency. Do not wait to see if the pain will go away. Get medical help at once. Call your local emergency services (911 in [place] Do not drive yourself to the hospital. MAKE SURE YOU:   Understand these instructions.  Will watch your condition.  Will get help right away if you are not doing well or get worse. Document Released: 05/10/2005 Document Revised: 08/05/2013 Document Reviewed: 03/05/2008 [facility] Patient Information 2015 [place], [place]. This information is not intended to replace advice given to you by your health care provider. Make sure you discuss any questions you have with your health care provider.

## 2014-07-27 DIAGNOSIS — R0600 Dyspnea, unspecified: Secondary | ICD-10-CM | POA: Diagnosis not present

## 2014-07-27 DIAGNOSIS — R002 Palpitations: Secondary | ICD-10-CM | POA: Diagnosis not present

## 2014-07-27 DIAGNOSIS — R079 Chest pain, unspecified: Secondary | ICD-10-CM | POA: Diagnosis not present

## 2014-07-28 ENCOUNTER — Ambulatory Visit (INDEPENDENT_AMBULATORY_CARE_PROVIDER_SITE_OTHER): Payer: Medicare Other | Admitting: Family Medicine

## 2014-07-28 ENCOUNTER — Encounter: Payer: Self-pay | Admitting: Family Medicine

## 2014-07-28 VITALS — BP 147/93 | HR 85 | Wt 192.0 lb

## 2014-07-28 DIAGNOSIS — E2749 Other adrenocortical insufficiency: Secondary | ICD-10-CM | POA: Diagnosis not present

## 2014-07-28 DIAGNOSIS — G4733 Obstructive sleep apnea (adult) (pediatric): Secondary | ICD-10-CM

## 2014-07-28 DIAGNOSIS — R0981 Nasal congestion: Secondary | ICD-10-CM

## 2014-07-28 DIAGNOSIS — I10 Essential (primary) hypertension: Secondary | ICD-10-CM

## 2014-07-28 DIAGNOSIS — R002 Palpitations: Secondary | ICD-10-CM | POA: Diagnosis not present

## 2014-07-28 DIAGNOSIS — D649 Anemia, unspecified: Secondary | ICD-10-CM | POA: Diagnosis not present

## 2014-07-28 DIAGNOSIS — R0602 Shortness of breath: Secondary | ICD-10-CM | POA: Diagnosis not present

## 2014-07-28 DIAGNOSIS — E780 Pure hypercholesterolemia: Secondary | ICD-10-CM | POA: Diagnosis not present

## 2014-07-28 DIAGNOSIS — E2740 Unspecified adrenocortical insufficiency: Secondary | ICD-10-CM

## 2014-07-28 NOTE — Progress Notes (Signed)
   Subjective:    Patient ID: Jennifer [name], female    DOB: 1965-09-22, 48 y.o.   MRN: [id number]  HPI Has pulmonary function test on Friday. She has been SOB intermitantly.  Still having a lot of thick mucous.  Still having coughing spels about twice a day. Hs been sneezing more lately.  Still using her sinus rinses and feels helping some.    Saw Cards again and they recommended she go back to see Endocrinology. They feel some of her electrolytes problems are causing her palpitations.    Still having some flashes.  She says will get very hot and then will suddenly get cold after about 10-15 minutes.  Says her marriage is falling apart. Says she may be leaving him  in April.  Seh is working on a plan. She may move out [place] or stay with her sister for awhile.   HTN- says atenolol makes her feel like someone is giving her chest a "bear-hug".  She stopped the medication and does not want to start anything new at this time.  Still having some swallowing problem . Plans on seeing GI again seen.  Review of Systems     Objective:   Physical Exam  Constitutional: She is oriented to person, place, and time. She appears well-developed and well-nourished.  HENT:  Head: Normocephalic and atraumatic.  Cardiovascular: Normal rate, regular rhythm and normal heart sounds.   Pulmonary/Chest: Effort normal and breath sounds normal.  Neurological: She is alert and oriented to person, place, and time.  Skin: Skin is warm and dry.  Psychiatric: She has a normal mood and affect. Her behavior is normal.          Assessment & Plan:  Shortness of breath-has spirometry planned for Friday. Will awit results.    Chronic sinusitis - Caonintue nasal rinses and nasal steroid spray to help wth inflammation. Avoid "over" blowing the nose.    Home stressors with marital problems. Encouraged her to talk with her therapist to work through some of these things. Next  Hypertension-she's longer taking the  atenolol. She really wants to work on reducing her stress levels and see if this helps her blood pressure. She's recently noticed a connection between her elevated blood pressures in her stress levels. 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   Plans on contacting GI again about swallowing problems.

## 2014-07-29 ENCOUNTER — Encounter: Payer: Self-pay | Admitting: Family Medicine

## 2014-07-29 DIAGNOSIS — J4520 Mild intermittent asthma, uncomplicated: Secondary | ICD-10-CM | POA: Diagnosis not present

## 2014-07-29 DIAGNOSIS — J309 Allergic rhinitis, unspecified: Secondary | ICD-10-CM | POA: Diagnosis not present

## 2014-07-30 DIAGNOSIS — F331 Major depressive disorder, recurrent, moderate: Secondary | ICD-10-CM | POA: Diagnosis not present

## 2014-08-02 DIAGNOSIS — E876 Hypokalemia: Secondary | ICD-10-CM | POA: Diagnosis not present

## 2014-08-02 DIAGNOSIS — Z885 Allergy status to narcotic agent status: Secondary | ICD-10-CM | POA: Diagnosis not present

## 2014-08-02 DIAGNOSIS — Z87892 Personal history of anaphylaxis: Secondary | ICD-10-CM | POA: Diagnosis not present

## 2014-08-02 DIAGNOSIS — Z880 Allergy status to penicillin: Secondary | ICD-10-CM | POA: Diagnosis not present

## 2014-08-02 DIAGNOSIS — Z79899 Other long term (current) drug therapy: Secondary | ICD-10-CM | POA: Diagnosis not present

## 2014-08-02 DIAGNOSIS — Z91018 Allergy to other foods: Secondary | ICD-10-CM | POA: Diagnosis not present

## 2014-08-02 DIAGNOSIS — Z882 Allergy status to sulfonamides status: Secondary | ICD-10-CM | POA: Diagnosis not present

## 2014-08-02 DIAGNOSIS — R079 Chest pain, unspecified: Secondary | ICD-10-CM | POA: Diagnosis not present

## 2014-08-02 DIAGNOSIS — F17211 Nicotine dependence, cigarettes, in remission: Secondary | ICD-10-CM | POA: Diagnosis not present

## 2014-08-02 DIAGNOSIS — Z7952 Long term (current) use of systemic steroids: Secondary | ICD-10-CM | POA: Diagnosis not present

## 2014-08-02 DIAGNOSIS — Z91048 Other nonmedicinal substance allergy status: Secondary | ICD-10-CM | POA: Diagnosis not present

## 2014-08-02 DIAGNOSIS — F419 Anxiety disorder, unspecified: Secondary | ICD-10-CM | POA: Diagnosis not present

## 2014-08-02 DIAGNOSIS — Z881 Allergy status to other antibiotic agents status: Secondary | ICD-10-CM | POA: Diagnosis not present

## 2014-08-02 DIAGNOSIS — I10 Essential (primary) hypertension: Secondary | ICD-10-CM | POA: Diagnosis not present

## 2014-08-02 DIAGNOSIS — K219 Gastro-esophageal reflux disease without esophagitis: Secondary | ICD-10-CM | POA: Diagnosis not present

## 2014-08-02 DIAGNOSIS — J069 Acute upper respiratory infection, unspecified: Secondary | ICD-10-CM | POA: Diagnosis not present

## 2014-08-02 LAB — HEPATIC FUNCTION PANEL
ALT: 23 U/L (ref 7–35)
AST: 17 U/L (ref 13–35)
Alkaline Phosphatase: 95 U/L (ref 25–125)
Bilirubin, Total: 0.4 mg/dL

## 2014-08-02 LAB — CBC AND DIFFERENTIAL
HCT: 39 % (ref 36–46)
Hemoglobin: 12.4 g/dL (ref 12.0–16.0)
Neutrophils Absolute: 3 /uL
Platelets: 216 10*3/uL (ref 150–399)
WBC: 4.8 10^3/mL

## 2014-08-02 LAB — BASIC METABOLIC PANEL
BUN: 10 mg/dL (ref 4–21)
Creatinine: 0.6 mg/dL (ref 0.5–1.1)
Glucose: 122 mg/dL
Potassium: 3.4 mmol/L (ref 3.4–5.3)
Sodium: 141 mmol/L (ref 137–147)

## 2014-08-03 ENCOUNTER — Telehealth: Payer: Self-pay | Admitting: Family Medicine

## 2014-08-03 DIAGNOSIS — D509 Iron deficiency anemia, unspecified: Secondary | ICD-10-CM | POA: Diagnosis not present

## 2014-08-03 MED ORDER — POTASSIUM CHLORIDE 20 MEQ/15ML (10%) PO SOLN: 10.0000 meq | Freq: Every day | ORAL | Status: DC

## 2014-08-03 NOTE — Telephone Encounter (Signed)
Please call pt: Please verify if taking potassium or not.  There is a pheon note from 12/2 where I had asked her to take her potassium daily.  If she is taking it then I need to adjust her dose. If she is not taking it then we need to have her start and then recheck a bmp in one week.

## 2014-08-03 NOTE — Telephone Encounter (Signed)
Jennifer [name] states she is not taking potassium. What dosing of potasium should she take? She needs liquid.

## 2014-08-03 NOTE — Telephone Encounter (Signed)
Will send over  A new script

## 2014-08-03 NOTE — Telephone Encounter (Signed)
Patient advised.

## 2014-08-04 ENCOUNTER — Ambulatory Visit: Payer: Medicare Other | Admitting: Family Medicine

## 2014-08-04 DIAGNOSIS — R1110 Vomiting, unspecified: Secondary | ICD-10-CM | POA: Diagnosis not present

## 2014-08-04 DIAGNOSIS — Z87820 Personal history of traumatic brain injury: Secondary | ICD-10-CM | POA: Diagnosis not present

## 2014-08-04 DIAGNOSIS — I10 Essential (primary) hypertension: Secondary | ICD-10-CM | POA: Diagnosis not present

## 2014-08-04 DIAGNOSIS — R509 Fever, unspecified: Secondary | ICD-10-CM | POA: Diagnosis not present

## 2014-08-04 DIAGNOSIS — R05 Cough: Secondary | ICD-10-CM | POA: Diagnosis not present

## 2014-08-04 DIAGNOSIS — R0602 Shortness of breath: Secondary | ICD-10-CM | POA: Diagnosis not present

## 2014-08-04 DIAGNOSIS — G8929 Other chronic pain: Secondary | ICD-10-CM | POA: Diagnosis not present

## 2014-08-04 DIAGNOSIS — J018 Other acute sinusitis: Secondary | ICD-10-CM | POA: Diagnosis not present

## 2014-08-04 DIAGNOSIS — R7309 Other abnormal glucose: Secondary | ICD-10-CM | POA: Diagnosis not present

## 2014-08-04 DIAGNOSIS — K760 Fatty (change of) liver, not elsewhere classified: Secondary | ICD-10-CM | POA: Diagnosis not present

## 2014-08-04 DIAGNOSIS — G35 Multiple sclerosis: Secondary | ICD-10-CM | POA: Diagnosis not present

## 2014-08-04 DIAGNOSIS — E876 Hypokalemia: Secondary | ICD-10-CM | POA: Diagnosis not present

## 2014-08-04 DIAGNOSIS — R093 Abnormal sputum: Secondary | ICD-10-CM | POA: Diagnosis not present

## 2014-08-04 DIAGNOSIS — J019 Acute sinusitis, unspecified: Secondary | ICD-10-CM | POA: Diagnosis not present

## 2014-08-06 ENCOUNTER — Emergency Department (HOSPITAL_BASED_OUTPATIENT_CLINIC_OR_DEPARTMENT_OTHER): Payer: Medicare Other

## 2014-08-06 ENCOUNTER — Encounter (HOSPITAL_BASED_OUTPATIENT_CLINIC_OR_DEPARTMENT_OTHER): Payer: Self-pay

## 2014-08-06 ENCOUNTER — Emergency Department (HOSPITAL_BASED_OUTPATIENT_CLINIC_OR_DEPARTMENT_OTHER)
Admission: EM | Admit: 2014-08-06 | Discharge: 2014-08-06 | Disposition: A | Discharge status: Home / Self Care | Payer: Medicare Other | Attending: Emergency Medicine | Admitting: Emergency Medicine

## 2014-08-06 DIAGNOSIS — Z8541 Personal history of malignant neoplasm of cervix uteri: Secondary | ICD-10-CM | POA: Insufficient documentation

## 2014-08-06 DIAGNOSIS — Z9981 Dependence on supplemental oxygen: Secondary | ICD-10-CM | POA: Diagnosis not present

## 2014-08-06 DIAGNOSIS — K219 Gastro-esophageal reflux disease without esophagitis: Secondary | ICD-10-CM | POA: Insufficient documentation

## 2014-08-06 DIAGNOSIS — K5900 Constipation, unspecified: Secondary | ICD-10-CM | POA: Diagnosis not present

## 2014-08-06 DIAGNOSIS — R61 Generalized hyperhidrosis: Secondary | ICD-10-CM | POA: Insufficient documentation

## 2014-08-06 DIAGNOSIS — Z880 Allergy status to penicillin: Secondary | ICD-10-CM | POA: Diagnosis not present

## 2014-08-06 DIAGNOSIS — I471 Supraventricular tachycardia: Secondary | ICD-10-CM | POA: Insufficient documentation

## 2014-08-06 DIAGNOSIS — Z862 Personal history of diseases of the blood and blood-forming organs and certain disorders involving the immune mechanism: Secondary | ICD-10-CM | POA: Insufficient documentation

## 2014-08-06 DIAGNOSIS — G8929 Other chronic pain: Secondary | ICD-10-CM | POA: Diagnosis not present

## 2014-08-06 DIAGNOSIS — F4310 Post-traumatic stress disorder, unspecified: Secondary | ICD-10-CM | POA: Diagnosis not present

## 2014-08-06 DIAGNOSIS — J45901 Unspecified asthma with (acute) exacerbation: Secondary | ICD-10-CM | POA: Insufficient documentation

## 2014-08-06 DIAGNOSIS — R5383 Other fatigue: Secondary | ICD-10-CM | POA: Diagnosis not present

## 2014-08-06 DIAGNOSIS — G4730 Sleep apnea, unspecified: Secondary | ICD-10-CM | POA: Diagnosis not present

## 2014-08-06 DIAGNOSIS — R079 Chest pain, unspecified: Secondary | ICD-10-CM | POA: Insufficient documentation

## 2014-08-06 DIAGNOSIS — R140 Abdominal distension (gaseous): Secondary | ICD-10-CM | POA: Diagnosis not present

## 2014-08-06 DIAGNOSIS — Z79899 Other long term (current) drug therapy: Secondary | ICD-10-CM | POA: Diagnosis not present

## 2014-08-06 DIAGNOSIS — I10 Essential (primary) hypertension: Secondary | ICD-10-CM | POA: Insufficient documentation

## 2014-08-06 DIAGNOSIS — Z8639 Personal history of other endocrine, nutritional and metabolic disease: Secondary | ICD-10-CM | POA: Insufficient documentation

## 2014-08-06 DIAGNOSIS — R0602 Shortness of breath: Secondary | ICD-10-CM

## 2014-08-06 DIAGNOSIS — Z8614 Personal history of Methicillin resistant Staphylococcus aureus infection: Secondary | ICD-10-CM | POA: Diagnosis not present

## 2014-08-06 DIAGNOSIS — Z85828 Personal history of other malignant neoplasm of skin: Secondary | ICD-10-CM | POA: Diagnosis not present

## 2014-08-06 DIAGNOSIS — Z87891 Personal history of nicotine dependence: Secondary | ICD-10-CM | POA: Diagnosis not present

## 2014-08-06 DIAGNOSIS — Z792 Long term (current) use of antibiotics: Secondary | ICD-10-CM | POA: Diagnosis not present

## 2014-08-06 DIAGNOSIS — R0989 Other specified symptoms and signs involving the circulatory and respiratory systems: Secondary | ICD-10-CM | POA: Diagnosis not present

## 2014-08-06 DIAGNOSIS — R1013 Epigastric pain: Secondary | ICD-10-CM | POA: Diagnosis not present

## 2014-08-06 DIAGNOSIS — R05 Cough: Secondary | ICD-10-CM | POA: Diagnosis not present

## 2014-08-06 LAB — URINALYSIS, ROUTINE W REFLEX MICROSCOPIC
Bilirubin Urine: NEGATIVE
Glucose, UA: NEGATIVE mg/dL
Hgb urine dipstick: NEGATIVE
Ketones, ur: NEGATIVE mg/dL
Leukocytes, UA: NEGATIVE
Nitrite: NEGATIVE
Protein, ur: NEGATIVE mg/dL
Specific Gravity, Urine: 1.028 (ref 1.005–1.030)
Urobilinogen, UA: 0.2 mg/dL (ref 0.0–1.0)
pH: 5 (ref 5.0–8.0)

## 2014-08-06 LAB — BASIC METABOLIC PANEL
Anion gap: 10 (ref 5–15)
BUN: 13 mg/dL (ref 6–23)
CO2: 24 mmol/L (ref 19–32)
Calcium: 9.3 mg/dL (ref 8.4–10.5)
Chloride: 107 mEq/L (ref 96–112)
Creatinine, Ser: 0.49 mg/dL — ABNORMAL LOW (ref 0.50–1.10)
GFR calc Af Amer: >90 mL/min (ref >90)
GFR calc non Af Amer: >90 mL/min (ref >90)
Glucose, Bld: 98 mg/dL (ref 70–99)
Potassium: 3.5 mmol/L (ref 3.5–5.1)
Sodium: 141 mmol/L (ref 135–145)

## 2014-08-06 LAB — CBC
HCT: 37.3 % (ref 36.0–46.0)
Hemoglobin: 11.9 g/dL — ABNORMAL LOW (ref 12.0–15.0)
MCH: 28.3 pg (ref 26.0–34.0)
MCHC: 31.9 g/dL (ref 30.0–36.0)
MCV: 88.8 fL (ref 78.0–100.0)
Platelets: 220 10*3/uL (ref 150–400)
RBC: 4.2 MIL/uL (ref 3.87–5.11)
RDW: 14.7 % (ref 11.5–15.5)
WBC: 7.3 10*3/uL (ref 4.0–10.5)

## 2014-08-06 LAB — TROPONIN I: Troponin I: <0.03 ng/mL (ref <0.031)

## 2014-08-06 MED ORDER — POLYETHYLENE GLYCOL 3350 17 G PO PACK: 17.0000 g | PACK | Freq: Every day | ORAL | Status: DC

## 2014-08-06 NOTE — ED Notes (Signed)
C/o SOB, cough-being treated for sinus with abx-pt NAD
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
## 2014-08-06 NOTE — ED Notes (Signed)
Patient has multiple complaints, including chest pain and abdominal pain. Patient reports that she has not been able to take her K for the last 2 -3 days because of N/V - also reports that the her abdominal area is swollen. Neither of these noted at this time. Patient is up and walking around without distress.

## 2014-08-06 NOTE — ED Notes (Signed)
Patient reports that she is uneasy with this RN starting an IV line because she required the "doppler" to start her IV;s and she has a specific RN that does it the best. Awaiting MD to see the patient.

## 2014-08-06 NOTE — Discharge Instructions (Signed)
Return to the ED with any concerns including vomiting, difficulty breathing, fainting, decreased level of alertness/lethargy, or any other alarming symptoms

## 2014-08-07 ENCOUNTER — Telehealth (HOSPITAL_BASED_OUTPATIENT_CLINIC_OR_DEPARTMENT_OTHER): Payer: Self-pay | Admitting: Emergency Medicine

## 2014-08-09 ENCOUNTER — Encounter (HOSPITAL_BASED_OUTPATIENT_CLINIC_OR_DEPARTMENT_OTHER): Payer: Self-pay | Admitting: *Deleted

## 2014-08-09 ENCOUNTER — Emergency Department (HOSPITAL_BASED_OUTPATIENT_CLINIC_OR_DEPARTMENT_OTHER): Payer: Medicare Other

## 2014-08-09 ENCOUNTER — Emergency Department (HOSPITAL_BASED_OUTPATIENT_CLINIC_OR_DEPARTMENT_OTHER)
Admission: EM | Admit: 2014-08-09 | Discharge: 2014-08-09 | Disposition: A | Discharge status: Home / Self Care | Payer: Medicare Other | Attending: Emergency Medicine | Admitting: Emergency Medicine

## 2014-08-09 DIAGNOSIS — G4730 Sleep apnea, unspecified: Secondary | ICD-10-CM | POA: Insufficient documentation

## 2014-08-09 DIAGNOSIS — Z8739 Personal history of other diseases of the musculoskeletal system and connective tissue: Secondary | ICD-10-CM | POA: Diagnosis not present

## 2014-08-09 DIAGNOSIS — Z9089 Acquired absence of other organs: Secondary | ICD-10-CM | POA: Diagnosis not present

## 2014-08-09 DIAGNOSIS — R109 Unspecified abdominal pain: Secondary | ICD-10-CM

## 2014-08-09 DIAGNOSIS — R634 Abnormal weight loss: Secondary | ICD-10-CM | POA: Diagnosis not present

## 2014-08-09 DIAGNOSIS — Z792 Long term (current) use of antibiotics: Secondary | ICD-10-CM | POA: Diagnosis not present

## 2014-08-09 DIAGNOSIS — Z7951 Long term (current) use of inhaled steroids: Secondary | ICD-10-CM | POA: Diagnosis not present

## 2014-08-09 DIAGNOSIS — Z87891 Personal history of nicotine dependence: Secondary | ICD-10-CM | POA: Insufficient documentation

## 2014-08-09 DIAGNOSIS — J45909 Unspecified asthma, uncomplicated: Secondary | ICD-10-CM | POA: Insufficient documentation

## 2014-08-09 DIAGNOSIS — E785 Hyperlipidemia, unspecified: Secondary | ICD-10-CM | POA: Insufficient documentation

## 2014-08-09 DIAGNOSIS — R1084 Generalized abdominal pain: Secondary | ICD-10-CM | POA: Diagnosis not present

## 2014-08-09 DIAGNOSIS — K219 Gastro-esophageal reflux disease without esophagitis: Secondary | ICD-10-CM | POA: Diagnosis not present

## 2014-08-09 DIAGNOSIS — Z7952 Long term (current) use of systemic steroids: Secondary | ICD-10-CM | POA: Diagnosis not present

## 2014-08-09 DIAGNOSIS — R0602 Shortness of breath: Secondary | ICD-10-CM | POA: Diagnosis not present

## 2014-08-09 DIAGNOSIS — Z862 Personal history of diseases of the blood and blood-forming organs and certain disorders involving the immune mechanism: Secondary | ICD-10-CM | POA: Diagnosis not present

## 2014-08-09 DIAGNOSIS — G35 Multiple sclerosis: Secondary | ICD-10-CM | POA: Diagnosis not present

## 2014-08-09 DIAGNOSIS — Z9851 Tubal ligation status: Secondary | ICD-10-CM | POA: Insufficient documentation

## 2014-08-09 DIAGNOSIS — Z90710 Acquired absence of both cervix and uterus: Secondary | ICD-10-CM | POA: Diagnosis not present

## 2014-08-09 DIAGNOSIS — Z8541 Personal history of malignant neoplasm of cervix uteri: Secondary | ICD-10-CM | POA: Insufficient documentation

## 2014-08-09 DIAGNOSIS — Z91018 Allergy to other foods: Secondary | ICD-10-CM | POA: Diagnosis not present

## 2014-08-09 DIAGNOSIS — Z8614 Personal history of Methicillin resistant Staphylococcus aureus infection: Secondary | ICD-10-CM | POA: Insufficient documentation

## 2014-08-09 DIAGNOSIS — Z886 Allergy status to analgesic agent status: Secondary | ICD-10-CM | POA: Diagnosis not present

## 2014-08-09 DIAGNOSIS — Z882 Allergy status to sulfonamides status: Secondary | ICD-10-CM | POA: Diagnosis not present

## 2014-08-09 DIAGNOSIS — E269 Hyperaldosteronism, unspecified: Secondary | ICD-10-CM | POA: Diagnosis not present

## 2014-08-09 DIAGNOSIS — F419 Anxiety disorder, unspecified: Secondary | ICD-10-CM | POA: Diagnosis not present

## 2014-08-09 DIAGNOSIS — Z888 Allergy status to other drugs, medicaments and biological substances status: Secondary | ICD-10-CM | POA: Diagnosis not present

## 2014-08-09 DIAGNOSIS — G8929 Other chronic pain: Secondary | ICD-10-CM | POA: Insufficient documentation

## 2014-08-09 DIAGNOSIS — Z79899 Other long term (current) drug therapy: Secondary | ICD-10-CM | POA: Diagnosis not present

## 2014-08-09 DIAGNOSIS — Z883 Allergy status to other anti-infective agents status: Secondary | ICD-10-CM | POA: Diagnosis not present

## 2014-08-09 DIAGNOSIS — Z880 Allergy status to penicillin: Secondary | ICD-10-CM | POA: Diagnosis not present

## 2014-08-09 DIAGNOSIS — Z9981 Dependence on supplemental oxygen: Secondary | ICD-10-CM | POA: Insufficient documentation

## 2014-08-09 DIAGNOSIS — I10 Essential (primary) hypertension: Secondary | ICD-10-CM | POA: Insufficient documentation

## 2014-08-09 DIAGNOSIS — R112 Nausea with vomiting, unspecified: Secondary | ICD-10-CM | POA: Diagnosis not present

## 2014-08-09 DIAGNOSIS — R110 Nausea: Secondary | ICD-10-CM | POA: Diagnosis not present

## 2014-08-09 DIAGNOSIS — I471 Supraventricular tachycardia: Secondary | ICD-10-CM | POA: Diagnosis not present

## 2014-08-09 DIAGNOSIS — K625 Hemorrhage of anus and rectum: Secondary | ICD-10-CM | POA: Diagnosis not present

## 2014-08-09 DIAGNOSIS — Z885 Allergy status to narcotic agent status: Secondary | ICD-10-CM | POA: Diagnosis not present

## 2014-08-09 DIAGNOSIS — K5901 Slow transit constipation: Secondary | ICD-10-CM | POA: Diagnosis not present

## 2014-08-09 DIAGNOSIS — Z8542 Personal history of malignant neoplasm of other parts of uterus: Secondary | ICD-10-CM | POA: Diagnosis not present

## 2014-08-09 DIAGNOSIS — Z9049 Acquired absence of other specified parts of digestive tract: Secondary | ICD-10-CM | POA: Diagnosis not present

## 2014-08-09 DIAGNOSIS — R1013 Epigastric pain: Secondary | ICD-10-CM | POA: Diagnosis not present

## 2014-08-09 DIAGNOSIS — N289 Disorder of kidney and ureter, unspecified: Secondary | ICD-10-CM | POA: Diagnosis not present

## 2014-08-09 DIAGNOSIS — K5900 Constipation, unspecified: Secondary | ICD-10-CM | POA: Diagnosis not present

## 2014-08-09 DIAGNOSIS — Z881 Allergy status to other antibiotic agents status: Secondary | ICD-10-CM | POA: Diagnosis not present

## 2014-08-09 LAB — CBC WITH DIFFERENTIAL/PLATELET
Basophils Absolute: 0 10*3/uL (ref 0.0–0.1)
Basophils Relative: 1 % (ref 0–1)
Eosinophils Absolute: 0.2 10*3/uL (ref 0.0–0.7)
Eosinophils Relative: 4 % (ref 0–5)
HCT: 37.4 % (ref 36.0–46.0)
Hemoglobin: 12.2 g/dL (ref 12.0–15.0)
Lymphocytes Relative: 30 % (ref 12–46)
Lymphs Abs: 1.5 10*3/uL (ref 0.7–4.0)
MCH: 29 pg (ref 26.0–34.0)
MCHC: 32.6 g/dL (ref 30.0–36.0)
MCV: 88.8 fL (ref 78.0–100.0)
Monocytes Absolute: 0.4 10*3/uL (ref 0.1–1.0)
Monocytes Relative: 9 % (ref 3–12)
Neutro Abs: 2.7 10*3/uL (ref 1.7–7.7)
Neutrophils Relative %: 56 % (ref 43–77)
Platelets: 201 10*3/uL (ref 150–400)
RBC: 4.21 MIL/uL (ref 3.87–5.11)
RDW: 14.7 % (ref 11.5–15.5)
WBC: 4.8 10*3/uL (ref 4.0–10.5)

## 2014-08-09 LAB — I-STAT CG4 LACTIC ACID, ED: Lactic Acid, Venous: 1.28 mmol/L (ref 0.5–2.2)

## 2014-08-09 LAB — COMPREHENSIVE METABOLIC PANEL
ALT: 42 U/L — ABNORMAL HIGH (ref 0–35)
AST: 32 U/L (ref 0–37)
Albumin: 4.1 g/dL (ref 3.5–5.2)
Alkaline Phosphatase: 90 U/L (ref 39–117)
Anion gap: 8 (ref 5–15)
BUN: 13 mg/dL (ref 6–23)
CO2: 25 mmol/L (ref 19–32)
Calcium: 8.9 mg/dL (ref 8.4–10.5)
Chloride: 108 mEq/L (ref 96–112)
Creatinine, Ser: 0.5 mg/dL (ref 0.50–1.10)
GFR calc Af Amer: >90 mL/min (ref >90)
GFR calc non Af Amer: >90 mL/min (ref >90)
Glucose, Bld: 93 mg/dL (ref 70–99)
Potassium: 4 mmol/L (ref 3.5–5.1)
Sodium: 141 mmol/L (ref 135–145)
Total Bilirubin: 0.3 mg/dL (ref 0.3–1.2)
Total Protein: 7.6 g/dL (ref 6.0–8.3)

## 2014-08-09 LAB — URINALYSIS, ROUTINE W REFLEX MICROSCOPIC
Bilirubin Urine: NEGATIVE
Glucose, UA: NEGATIVE mg/dL
Hgb urine dipstick: NEGATIVE
Ketones, ur: NEGATIVE mg/dL
Leukocytes, UA: NEGATIVE
Nitrite: NEGATIVE
Protein, ur: NEGATIVE mg/dL
Specific Gravity, Urine: 1.026 (ref 1.005–1.030)
Urobilinogen, UA: 0.2 mg/dL (ref 0.0–1.0)
pH: 5 (ref 5.0–8.0)

## 2014-08-09 LAB — TROPONIN I: Troponin I: <0.03 ng/mL (ref <0.031)

## 2014-08-09 LAB — BRAIN NATRIURETIC PEPTIDE: B Natriuretic Peptide: 15.3 pg/mL (ref 0.0–100.0)

## 2014-08-09 LAB — LIPASE, BLOOD: Lipase: 43 U/L (ref 11–59)

## 2014-08-09 IMAGING — CR DG CHEST 2V
2 series · 2 of 2 positions shown · non-contrast
Comparison: 03/27/2013

CLINICAL DATA: Cough and short of breath

EXAM:
CHEST  2 VIEW

[w chest pa]
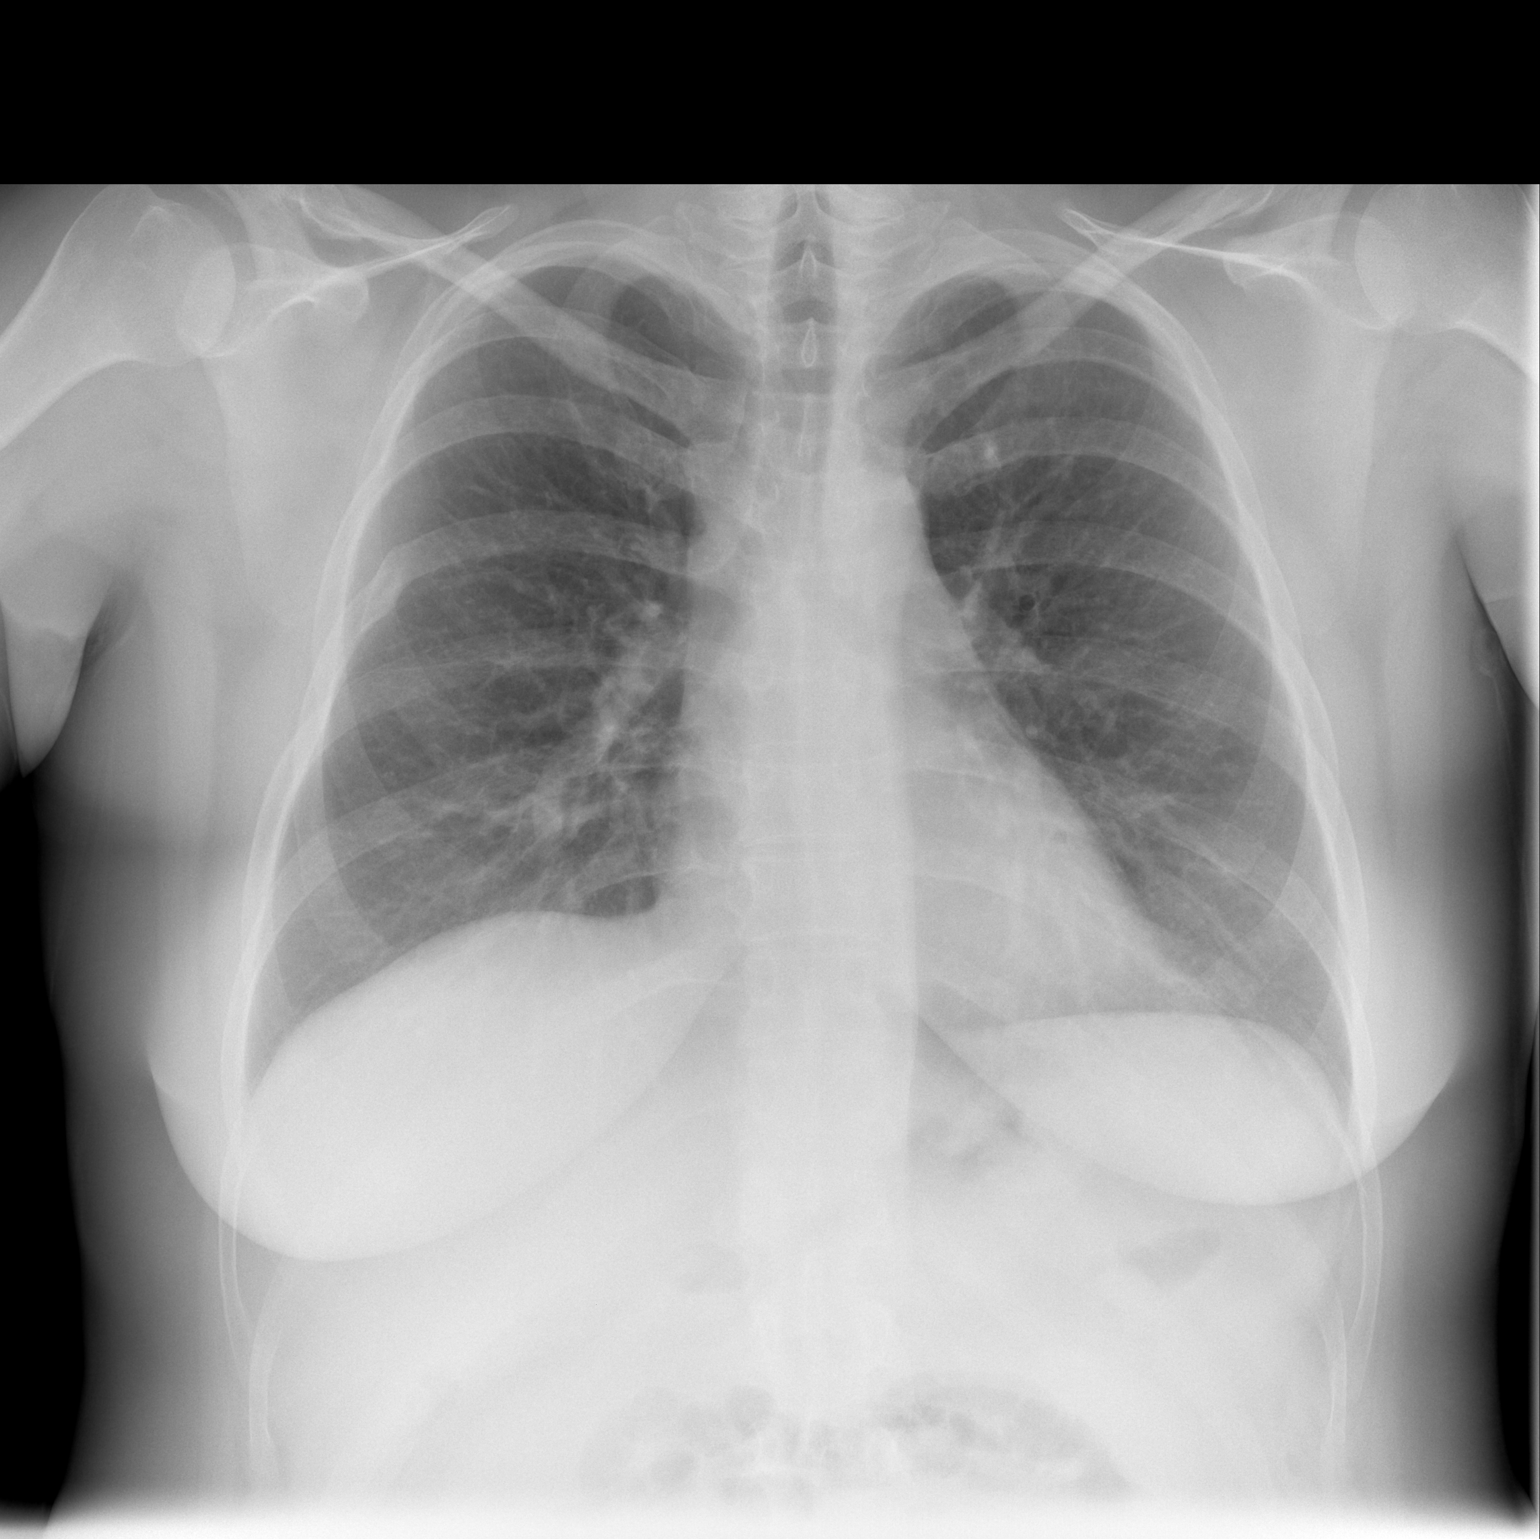

[w chest lat]
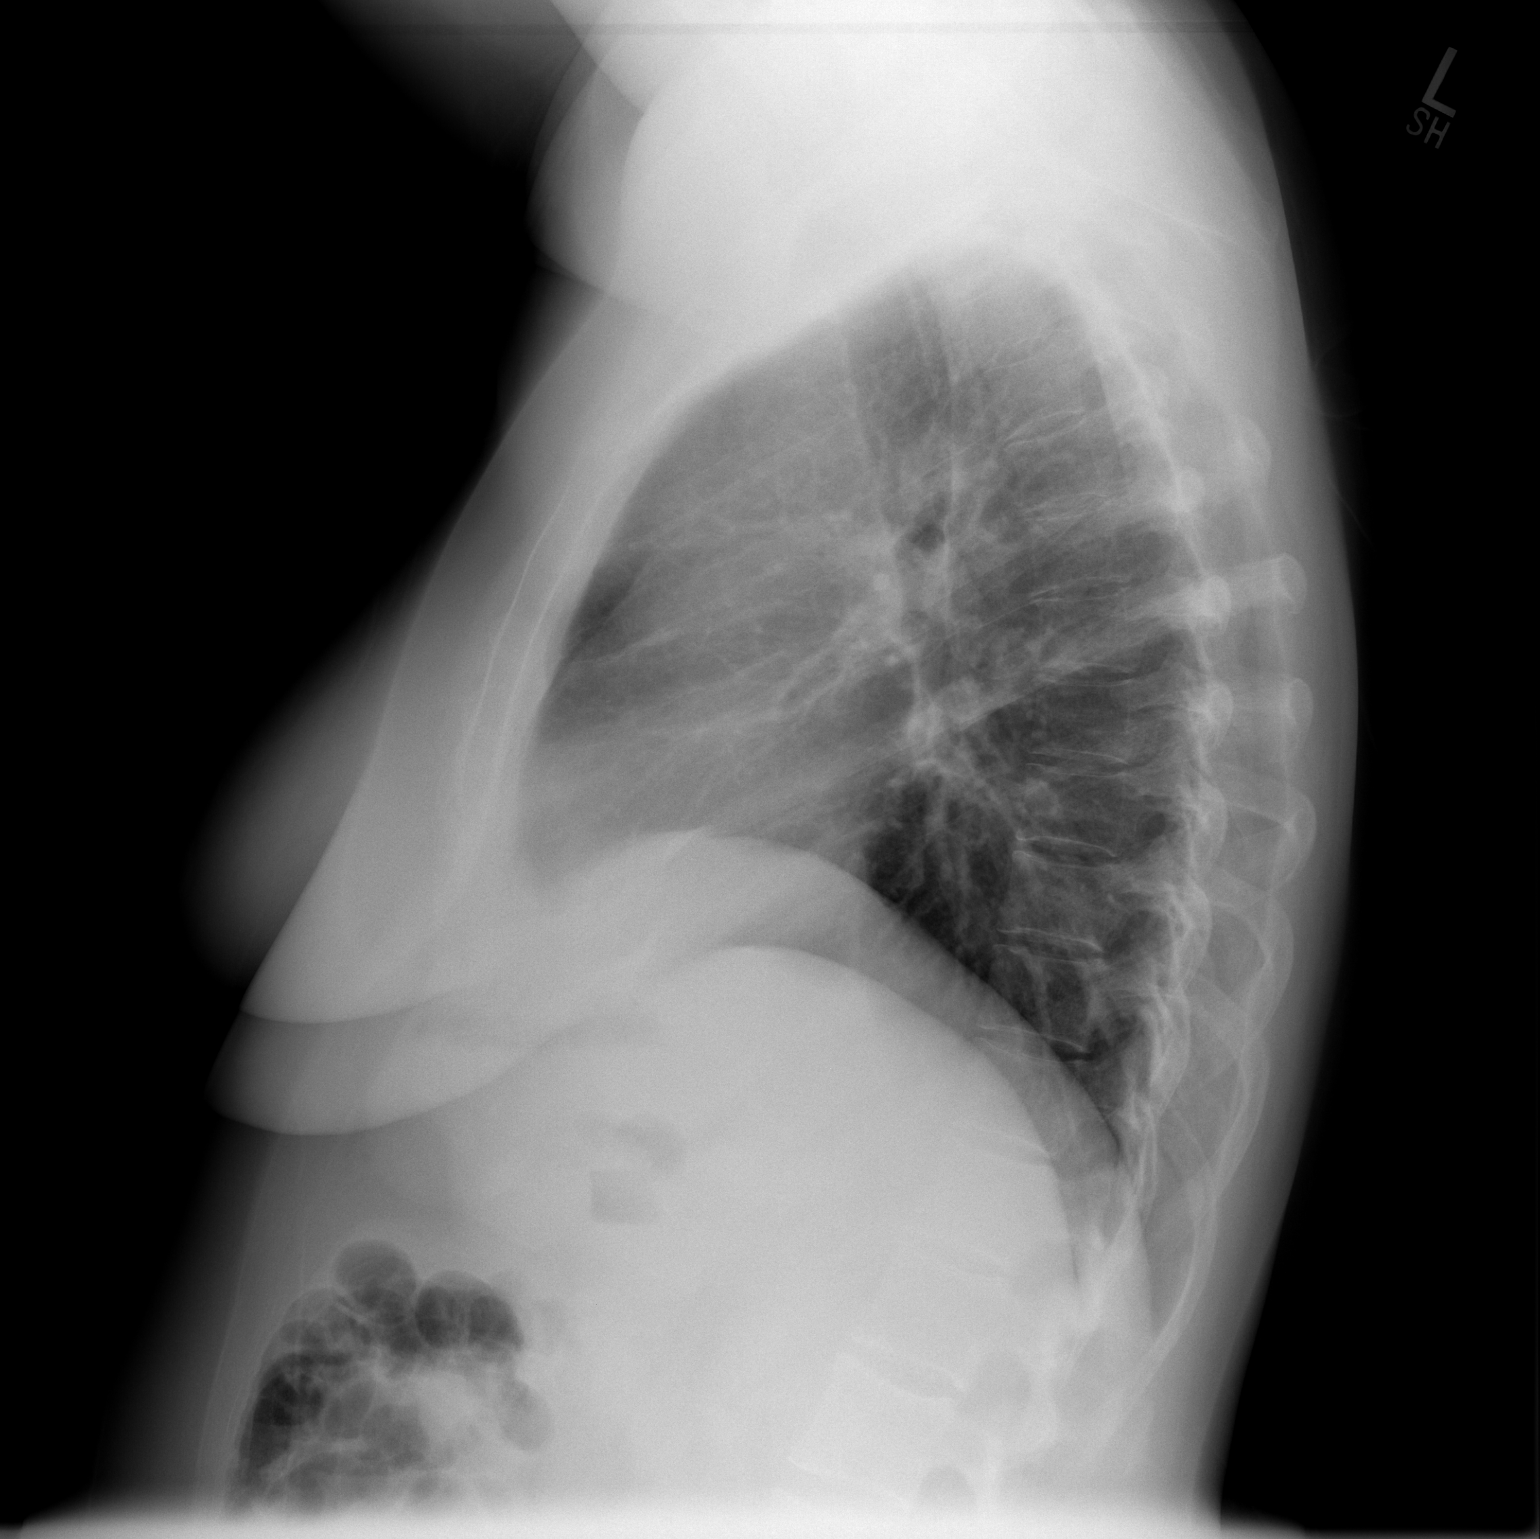

[2 of 2 positions shown; findings below may reference images not displayed]

FINDINGS: Normal heart size. Clear lungs. No pneumothorax or pleural effusion.
IMPRESSION: No active cardiopulmonary disease.

## 2014-08-09 MED ORDER — DICYCLOMINE HCL 20 MG PO TABS: 20.0000 mg | ORAL_TABLET | Freq: Two times a day (BID) | ORAL | Status: DC

## 2014-08-09 MED ORDER — MAGNESIUM CITRATE PO SOLN: 1.0000 | Freq: Once | ORAL | Status: DC

## 2014-08-09 MED ORDER — RANITIDINE HCL 150 MG PO TABS: 150.0000 mg | ORAL_TABLET | Freq: Two times a day (BID) | ORAL | Status: DC

## 2014-08-09 MED ORDER — IOHEXOL 300 MG/ML  SOLN
100.0000 mL | Freq: Once | INTRAMUSCULAR | Status: AC | PRN
  Administered 2014-08-09: 100 mL via INTRAVENOUS

## 2014-08-09 MED ORDER — IOHEXOL 300 MG/ML  SOLN
50.0000 mL | Freq: Once | INTRAMUSCULAR | Status: AC | PRN
  Administered 2014-08-09: 50 mL via ORAL

## 2014-08-09 MED ORDER — LACTULOSE 10 GM/15ML PO SOLN: 20.0000 g | Freq: Two times a day (BID) | ORAL | Status: DC | PRN
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
## 2014-08-09 NOTE — ED Notes (Signed)
C/o abd pain and distention, mild nausea and some related subjective sob. No dyspnea noted. (denies: vd, fever, bleeding or other sx), "feel bloated from abd to pelvis". Seen here 12/24 for the same. Has been taking miralax for 3d w/o relief. Reports 4lb. weight gain. Having problems since cholecystectomy in September. Reports "decent BM yesterday". Rates diffuse abd pain 8/10. Alert, NAD, calm, interactive.

## 2014-08-09 NOTE — ED Notes (Signed)
Patient states same condition as when was last seen. Patient states "just don't feel good, feels she has gained weight too".

## 2014-08-09 NOTE — ED Notes (Signed)
Patient states she will not put on a gown until caregiver is ready to see her, as she gets "itchy" in hospital gowns.

## 2014-08-09 NOTE — ED Notes (Signed)
I took a second BP with reading of 157/75 which was correct reading, as other cuff was too small.

## 2014-08-09 NOTE — Discharge Instructions (Signed)
Please follow-up with her GI specialist for further diagnostics. Utilize your primary care doctor as needed.  Abdominal Pain Many things can cause abdominal pain. Usually, abdominal pain is not caused by a disease and will improve without treatment. It can often be observed and treated at home. Your health care provider will do a physical exam and possibly order blood tests and X-rays to help determine the seriousness of your pain. However, in many cases, more time must pass before a clear cause of the pain can be found. Before that point, your health care provider may not know if you need more testing or further treatment. HOME CARE INSTRUCTIONS  Monitor your abdominal pain for any changes. The following actions may help to alleviate any discomfort you are experiencing:  Only take over-the-counter or prescription medicines as directed by your health care provider.  Do not take laxatives unless directed to do so by your health care provider.  Try a clear liquid diet (broth, tea, or water) as directed by your health care provider. Slowly move to a bland diet as tolerated. SEEK MEDICAL CARE IF:  You have unexplained abdominal pain.  You have abdominal pain associated with nausea or diarrhea.  You have pain when you urinate or have a bowel movement.  You experience abdominal pain that wakes you in the night.  You have abdominal pain that is worsened or improved by eating food.  You have abdominal pain that is worsened with eating fatty foods.  You have a fever. SEEK IMMEDIATE MEDICAL CARE IF:   Your pain does not go away within 2 hours.  You keep throwing up (vomiting).  Your pain is felt only in portions of the abdomen, such as the right side or the left lower portion of the abdomen.  You pass bloody or black tarry stools. MAKE SURE YOU:  Understand these instructions.   Will watch your condition.   Will get help right away if you are not doing well or get worse.  Document  Released: 05/10/2005 Document Revised: 08/05/2013 Document Reviewed: 04/09/2013 [facility] Patient Information 2015 [place], [place]. This information is not intended to replace advice given to you by your health care provider. Make sure you discuss any questions you have with your health care provider.

## 2014-08-10 ENCOUNTER — Telehealth: Payer: Self-pay

## 2014-08-10 DIAGNOSIS — K5900 Constipation, unspecified: Secondary | ICD-10-CM | POA: Diagnosis not present

## 2014-08-10 DIAGNOSIS — G8929 Other chronic pain: Secondary | ICD-10-CM | POA: Diagnosis not present

## 2014-08-10 DIAGNOSIS — R1084 Generalized abdominal pain: Secondary | ICD-10-CM | POA: Diagnosis not present

## 2014-08-10 DIAGNOSIS — Z87820 Personal history of traumatic brain injury: Secondary | ICD-10-CM | POA: Diagnosis not present

## 2014-08-10 DIAGNOSIS — G35 Multiple sclerosis: Secondary | ICD-10-CM | POA: Diagnosis not present

## 2014-08-10 DIAGNOSIS — R110 Nausea: Secondary | ICD-10-CM | POA: Diagnosis not present

## 2014-08-10 DIAGNOSIS — R109 Unspecified abdominal pain: Secondary | ICD-10-CM | POA: Diagnosis not present

## 2014-08-10 DIAGNOSIS — K5901 Slow transit constipation: Secondary | ICD-10-CM | POA: Diagnosis not present

## 2014-08-10 DIAGNOSIS — R7309 Other abnormal glucose: Secondary | ICD-10-CM | POA: Diagnosis not present

## 2014-08-10 DIAGNOSIS — K760 Fatty (change of) liver, not elsewhere classified: Secondary | ICD-10-CM | POA: Diagnosis not present

## 2014-08-10 DIAGNOSIS — I10 Essential (primary) hypertension: Secondary | ICD-10-CM | POA: Diagnosis not present

## 2014-08-10 NOTE — Telephone Encounter (Signed)
Left a message asking if patient was still going to get the bronchial methacholine challenge test.

## 2014-08-12 ENCOUNTER — Telehealth: Payer: Self-pay | Admitting: *Deleted

## 2014-08-12 DIAGNOSIS — M79601 Pain in right arm: Secondary | ICD-10-CM | POA: Diagnosis not present

## 2014-08-12 DIAGNOSIS — E785 Hyperlipidemia, unspecified: Secondary | ICD-10-CM | POA: Diagnosis not present

## 2014-08-12 DIAGNOSIS — Z885 Allergy status to narcotic agent status: Secondary | ICD-10-CM | POA: Diagnosis not present

## 2014-08-12 DIAGNOSIS — Z881 Allergy status to other antibiotic agents status: Secondary | ICD-10-CM | POA: Diagnosis not present

## 2014-08-12 DIAGNOSIS — R0602 Shortness of breath: Secondary | ICD-10-CM | POA: Diagnosis not present

## 2014-08-12 DIAGNOSIS — F419 Anxiety disorder, unspecified: Secondary | ICD-10-CM | POA: Diagnosis not present

## 2014-08-12 DIAGNOSIS — Z7952 Long term (current) use of systemic steroids: Secondary | ICD-10-CM | POA: Diagnosis not present

## 2014-08-12 DIAGNOSIS — F17211 Nicotine dependence, cigarettes, in remission: Secondary | ICD-10-CM | POA: Diagnosis not present

## 2014-08-12 DIAGNOSIS — Z79899 Other long term (current) drug therapy: Secondary | ICD-10-CM | POA: Diagnosis not present

## 2014-08-12 DIAGNOSIS — Z888 Allergy status to other drugs, medicaments and biological substances status: Secondary | ICD-10-CM | POA: Diagnosis not present

## 2014-08-12 DIAGNOSIS — Z87892 Personal history of anaphylaxis: Secondary | ICD-10-CM | POA: Diagnosis not present

## 2014-08-12 DIAGNOSIS — I10 Essential (primary) hypertension: Secondary | ICD-10-CM | POA: Diagnosis not present

## 2014-08-12 DIAGNOSIS — Z880 Allergy status to penicillin: Secondary | ICD-10-CM | POA: Diagnosis not present

## 2014-08-12 DIAGNOSIS — Z882 Allergy status to sulfonamides status: Secondary | ICD-10-CM | POA: Diagnosis not present

## 2014-08-12 DIAGNOSIS — Z7951 Long term (current) use of inhaled steroids: Secondary | ICD-10-CM | POA: Diagnosis not present

## 2014-08-12 DIAGNOSIS — Z91018 Allergy to other foods: Secondary | ICD-10-CM | POA: Diagnosis not present

## 2014-08-12 DIAGNOSIS — K219 Gastro-esophageal reflux disease without esophagitis: Secondary | ICD-10-CM | POA: Diagnosis not present

## 2014-08-12 DIAGNOSIS — R079 Chest pain, unspecified: Secondary | ICD-10-CM | POA: Diagnosis not present

## 2014-08-12 NOTE — Telephone Encounter (Signed)
Jennifer [name] called stating that she was having pain and swelling in her arm that they did her cath in. I told her that if she was having that much swelling and pain that went straight through her breast she needed to contact her cardiologist that did her procedure or go to the emergency room if she was hurting that bad. She was weepy and emotional and less than understanding but I reiterated to her that I was not able to provide her with an appt today and she should go to ER if she were in that much pain. She denied fever and only verbalized pain and swelling. I did schedule her to come in to office tomorrow as a follow up but explained to her that not to wait or hold off treatment because of this appt that she needed to contract her cardiologist. She verbalized some agreeance and hung up.

## 2014-08-13 ENCOUNTER — Ambulatory Visit (INDEPENDENT_AMBULATORY_CARE_PROVIDER_SITE_OTHER): Payer: Medicare Other | Admitting: Family Medicine

## 2014-08-13 ENCOUNTER — Encounter: Payer: Self-pay | Admitting: Family Medicine

## 2014-08-13 ENCOUNTER — Other Ambulatory Visit (HOSPITAL_BASED_OUTPATIENT_CLINIC_OR_DEPARTMENT_OTHER): Payer: Medicare Other

## 2014-08-13 VITALS — BP 144/86 | HR 96 | Temp 98.9°F | Wt 194.0 lb

## 2014-08-13 DIAGNOSIS — N63 Unspecified lump in breast: Secondary | ICD-10-CM | POA: Diagnosis not present

## 2014-08-13 DIAGNOSIS — M79621 Pain in right upper arm: Secondary | ICD-10-CM

## 2014-08-13 DIAGNOSIS — R140 Abdominal distension (gaseous): Secondary | ICD-10-CM | POA: Diagnosis not present

## 2014-08-13 DIAGNOSIS — K5901 Slow transit constipation: Secondary | ICD-10-CM | POA: Diagnosis not present

## 2014-08-13 DIAGNOSIS — E876 Hypokalemia: Secondary | ICD-10-CM | POA: Diagnosis not present

## 2014-08-13 DIAGNOSIS — M25511 Pain in right shoulder: Secondary | ICD-10-CM | POA: Diagnosis not present

## 2014-08-13 DIAGNOSIS — N644 Mastodynia: Secondary | ICD-10-CM

## 2014-08-13 DIAGNOSIS — Z1239 Encounter for other screening for malignant neoplasm of breast: Secondary | ICD-10-CM | POA: Diagnosis not present

## 2014-08-13 MED ORDER — POLYETHYLENE GLYCOL 3350 17 GM/SCOOP PO POWD: 17.0000 g | Freq: Two times a day (BID) | ORAL | Status: DC | PRN

## 2014-08-13 NOTE — Progress Notes (Signed)
   Subjective:    Patient ID: Jennifer [name], female    DOB: Mar 23, 1966, 48 y.o.   MRN: [id number]  HPI Right arm axillary pain. Feels tender and swollen. Says she feels a knot. She is worried it is related to recent catherization. She initially had some swelling just above the elbow after her catheterization. That did eventually resolve but now is more swollen under the axilla. She says it does radiate into the lateral part of the right breast. She says it's been sore for several days and feels like it's getting a little bit worse. No fevers chills or sweats. No erythema over the area. No trauma or injury.   Potassium - repeat potassium 3 days ago looks great.  Taking 38mEQ daily.   Has been in ED twice for SOB.  Had been bloated as well.  Says had xray and then 2 days later had CT scan which showed full bowels. She has started miralax.  She has had some bright red rectal bleeding. Has appt with GI to follow this up. Says the bloating has gotten a little better.   Review of Systems     Objective:   Physical Exam  Constitutional: She appears well-developed and well-nourished.  HENT:  Head: Normocephalic and atraumatic.  Pulmonary/Chest:    Abdominal: Soft. Bowel sounds are normal. She exhibits distension. She exhibits no mass. There is no tenderness. There is no rebound and no guarding.  Skin: Skin is warm and dry.  Psychiatric: She has a normal mood and affect. Her behavior is normal.          Assessment & Plan:  Right axillary pain - most likely cause is swollen lymph nodes. I do not palpate any distinct hard nodes or nodules. But she certainly is a little puffy and is tender in that area. No strain or injury. Recommend axillary ultrasound as well as ultrasound of the right breast and diagnostic me in my as the pain does radiate into her breast tissue. She says her breast is just been sore for several days. Again no recent injury or change in bra wear  Hypokalemia - Recheck in  one week. Currently potassium looks great. We'll continue with 10 mEq daily.   Full of stool. Increase miralax BID if she's not getting a good effect over the weekend then please call us back. Make sure to increase fiber. Drink plenty of water. I know she struggles with this but encouraged her to do her best. This helped keeps the stool much softer and moving through. Consider repeat imaging if she's not improving.

## 2014-08-15 ENCOUNTER — Encounter: Payer: Self-pay | Admitting: Family Medicine

## 2014-08-17 ENCOUNTER — Ambulatory Visit: Payer: Medicare Other | Admitting: Family Medicine

## 2014-08-17 ENCOUNTER — Emergency Department (HOSPITAL_BASED_OUTPATIENT_CLINIC_OR_DEPARTMENT_OTHER)
Admission: EM | Admit: 2014-08-17 | Discharge: 2014-08-17 | Disposition: A | Discharge status: Home / Self Care | Payer: Medicare Other | Attending: Emergency Medicine | Admitting: Emergency Medicine

## 2014-08-17 ENCOUNTER — Emergency Department (HOSPITAL_BASED_OUTPATIENT_CLINIC_OR_DEPARTMENT_OTHER): Payer: Medicare Other

## 2014-08-17 ENCOUNTER — Encounter (HOSPITAL_BASED_OUTPATIENT_CLINIC_OR_DEPARTMENT_OTHER): Payer: Self-pay | Admitting: Emergency Medicine

## 2014-08-17 DIAGNOSIS — Z880 Allergy status to penicillin: Secondary | ICD-10-CM | POA: Insufficient documentation

## 2014-08-17 DIAGNOSIS — Z8639 Personal history of other endocrine, nutritional and metabolic disease: Secondary | ICD-10-CM | POA: Insufficient documentation

## 2014-08-17 DIAGNOSIS — Z8542 Personal history of malignant neoplasm of other parts of uterus: Secondary | ICD-10-CM | POA: Diagnosis not present

## 2014-08-17 DIAGNOSIS — K5900 Constipation, unspecified: Secondary | ICD-10-CM | POA: Diagnosis not present

## 2014-08-17 DIAGNOSIS — I10 Essential (primary) hypertension: Secondary | ICD-10-CM | POA: Diagnosis not present

## 2014-08-17 DIAGNOSIS — K219 Gastro-esophageal reflux disease without esophagitis: Secondary | ICD-10-CM | POA: Insufficient documentation

## 2014-08-17 DIAGNOSIS — Z862 Personal history of diseases of the blood and blood-forming organs and certain disorders involving the immune mechanism: Secondary | ICD-10-CM | POA: Diagnosis not present

## 2014-08-17 DIAGNOSIS — J45909 Unspecified asthma, uncomplicated: Secondary | ICD-10-CM | POA: Insufficient documentation

## 2014-08-17 DIAGNOSIS — Z9981 Dependence on supplemental oxygen: Secondary | ICD-10-CM | POA: Diagnosis not present

## 2014-08-17 DIAGNOSIS — Z9049 Acquired absence of other specified parts of digestive tract: Secondary | ICD-10-CM | POA: Diagnosis not present

## 2014-08-17 DIAGNOSIS — R109 Unspecified abdominal pain: Secondary | ICD-10-CM

## 2014-08-17 DIAGNOSIS — Z79899 Other long term (current) drug therapy: Secondary | ICD-10-CM | POA: Diagnosis not present

## 2014-08-17 DIAGNOSIS — Z87891 Personal history of nicotine dependence: Secondary | ICD-10-CM | POA: Diagnosis not present

## 2014-08-17 DIAGNOSIS — R110 Nausea: Secondary | ICD-10-CM | POA: Insufficient documentation

## 2014-08-17 DIAGNOSIS — G4730 Sleep apnea, unspecified: Secondary | ICD-10-CM | POA: Diagnosis not present

## 2014-08-17 DIAGNOSIS — G35 Multiple sclerosis: Secondary | ICD-10-CM | POA: Diagnosis not present

## 2014-08-17 DIAGNOSIS — R112 Nausea with vomiting, unspecified: Secondary | ICD-10-CM | POA: Diagnosis not present

## 2014-08-17 DIAGNOSIS — Z8614 Personal history of Methicillin resistant Staphylococcus aureus infection: Secondary | ICD-10-CM | POA: Insufficient documentation

## 2014-08-17 DIAGNOSIS — Z8659 Personal history of other mental and behavioral disorders: Secondary | ICD-10-CM | POA: Diagnosis not present

## 2014-08-17 DIAGNOSIS — E876 Hypokalemia: Secondary | ICD-10-CM | POA: Diagnosis not present

## 2014-08-17 DIAGNOSIS — R1011 Right upper quadrant pain: Secondary | ICD-10-CM | POA: Diagnosis not present

## 2014-08-17 DIAGNOSIS — Z9851 Tubal ligation status: Secondary | ICD-10-CM | POA: Diagnosis not present

## 2014-08-17 LAB — CBC WITH DIFFERENTIAL/PLATELET
Basophils Absolute: 0 10*3/uL (ref 0.0–0.1)
Basophils Relative: 1 % (ref 0–1)
Eosinophils Absolute: 0.2 10*3/uL (ref 0.0–0.7)
Eosinophils Relative: 3 % (ref 0–5)
HCT: 40.1 % (ref 36.0–46.0)
Hemoglobin: 12.8 g/dL (ref 12.0–15.0)
Lymphocytes Relative: 26 % (ref 12–46)
Lymphs Abs: 1.5 10*3/uL (ref 0.7–4.0)
MCH: 28.3 pg (ref 26.0–34.0)
MCHC: 31.9 g/dL (ref 30.0–36.0)
MCV: 88.5 fL (ref 78.0–100.0)
Monocytes Absolute: 0.5 10*3/uL (ref 0.1–1.0)
Monocytes Relative: 8 % (ref 3–12)
Neutro Abs: 3.8 10*3/uL (ref 1.7–7.7)
Neutrophils Relative %: 62 % (ref 43–77)
Platelets: 214 10*3/uL (ref 150–400)
RBC: 4.53 MIL/uL (ref 3.87–5.11)
RDW: 14.9 % (ref 11.5–15.5)
WBC: 6.1 10*3/uL (ref 4.0–10.5)

## 2014-08-17 LAB — COMPREHENSIVE METABOLIC PANEL
ALT: 41 U/L — ABNORMAL HIGH (ref 0–35)
AST: 22 U/L (ref 0–37)
Albumin: 3.9 g/dL (ref 3.5–5.2)
Alkaline Phosphatase: 88 U/L (ref 39–117)
Anion gap: 6 (ref 5–15)
BUN: 13 mg/dL (ref 6–23)
CO2: 27 mmol/L (ref 19–32)
Calcium: 8.6 mg/dL (ref 8.4–10.5)
Chloride: 107 mEq/L (ref 96–112)
Creatinine, Ser: 0.46 mg/dL — ABNORMAL LOW (ref 0.50–1.10)
GFR calc Af Amer: >90 mL/min (ref >90)
GFR calc non Af Amer: >90 mL/min (ref >90)
Glucose, Bld: 114 mg/dL — ABNORMAL HIGH (ref 70–99)
Potassium: 3.5 mmol/L (ref 3.5–5.1)
Sodium: 140 mmol/L (ref 135–145)
Total Bilirubin: 0.3 mg/dL (ref 0.3–1.2)
Total Protein: 7 g/dL (ref 6.0–8.3)

## 2014-08-17 LAB — URINALYSIS, ROUTINE W REFLEX MICROSCOPIC
Bilirubin Urine: NEGATIVE
Glucose, UA: NEGATIVE mg/dL
Hgb urine dipstick: NEGATIVE
Ketones, ur: NEGATIVE mg/dL
Leukocytes, UA: NEGATIVE
Nitrite: NEGATIVE
Protein, ur: NEGATIVE mg/dL
Specific Gravity, Urine: 1.017 (ref 1.005–1.030)
Urobilinogen, UA: 0.2 mg/dL (ref 0.0–1.0)
pH: 6 (ref 5.0–8.0)

## 2014-08-17 LAB — TROPONIN I: Troponin I: <0.03 ng/mL (ref <0.031)

## 2014-08-17 LAB — LIPASE, BLOOD: Lipase: 37 U/L (ref 11–59)

## 2014-08-17 MED ORDER — ONDANSETRON HCL 4 MG PO TABS: 4.0000 mg | ORAL_TABLET | Freq: Four times a day (QID) | ORAL | Status: DC

## 2014-08-17 MED ORDER — SODIUM CHLORIDE 0.9 % IV BOLUS (SEPSIS)
1000.0000 mL | Freq: Once | INTRAVENOUS | Status: AC
  Administered 2014-08-17: 1000 mL via INTRAVENOUS

## 2014-08-17 MED ORDER — ONDANSETRON HCL 4 MG/2ML IJ SOLN
4.0000 mg | Freq: Once | INTRAMUSCULAR | Status: AC
  Administered 2014-08-17: 4 mg via INTRAVENOUS
  Filled 2014-08-17: qty 2

## 2014-08-17 NOTE — Discharge Instructions (Signed)
Follow-up with her primary care doctor. Return to the ER for any severe abdominal pain, nausea, vomiting, unable to tolerate fluids, high fever, difficulty urinating.   Abdominal Pain Many things can cause abdominal pain. Usually, abdominal pain is not caused by a disease and will improve without treatment. It can often be observed and treated at home. Your health care provider will do a physical exam and possibly order blood tests and X-rays to help determine the seriousness of your pain. However, in many cases, more time must pass before a clear cause of the pain can be found. Before that point, your health care provider may not know if you need more testing or further treatment. HOME CARE INSTRUCTIONS  Monitor your abdominal pain for any changes. The following actions may help to alleviate any discomfort you are experiencing:  Only take over-the-counter or prescription medicines as directed by your health care provider.  Do not take laxatives unless directed to do so by your health care provider.  Try a clear liquid diet (broth, tea, or water) as directed by your health care provider. Slowly move to a bland diet as tolerated. SEEK MEDICAL CARE IF:  You have unexplained abdominal pain.  You have abdominal pain associated with nausea or diarrhea.  You have pain when you urinate or have a bowel movement.  You experience abdominal pain that wakes you in the night.  You have abdominal pain that is worsened or improved by eating food.  You have abdominal pain that is worsened with eating fatty foods.  You have a fever. SEEK IMMEDIATE MEDICAL CARE IF:   Your pain does not go away within 2 hours.  You keep throwing up (vomiting).  Your pain is felt only in portions of the abdomen, such as the right side or the left lower portion of the abdomen.  You pass bloody or black tarry stools. MAKE SURE YOU:  Understand these instructions.   Will watch your condition.   Will get help  right away if you are not doing well or get worse.  Document Released: 05/10/2005 Document Revised: 08/05/2013 Document Reviewed: 04/09/2013 [facility] Patient Information 2015 [place], [place]. This information is not intended to replace advice given to you by your health care provider. Make sure you discuss any questions you have with your health care provider.

## 2014-08-17 NOTE — ED Notes (Signed)
Patient transported to X-ray 

## 2014-08-17 NOTE — ED Notes (Signed)
Pt c/o RUQ abdominal pain since this am.  Some N/V.  Pt states she has seen her MD recently and has increased her miralax lately.
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
## 2014-08-18 ENCOUNTER — Ambulatory Visit (INDEPENDENT_AMBULATORY_CARE_PROVIDER_SITE_OTHER): Payer: Medicare Other | Admitting: Endocrinology

## 2014-08-18 ENCOUNTER — Encounter: Payer: Self-pay | Admitting: Endocrinology

## 2014-08-18 VITALS — BP 136/84 | HR 97 | Temp 98.6°F | Ht 62.0 in | Wt 194.0 lb

## 2014-08-18 DIAGNOSIS — E269 Hyperaldosteronism, unspecified: Secondary | ICD-10-CM | POA: Diagnosis not present

## 2014-08-18 NOTE — Patient Instructions (Addendum)
Please sign a release of information from [facility].   blood tests are being requested for you today.  We'll let you know about the results.   i would be happy to reconsider the saline suppression test, or the dexamethasone suppression test.  We can discuss this later.
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
## 2014-08-19 DIAGNOSIS — K5900 Constipation, unspecified: Secondary | ICD-10-CM | POA: Diagnosis not present

## 2014-08-19 DIAGNOSIS — K589 Irritable bowel syndrome without diarrhea: Secondary | ICD-10-CM | POA: Diagnosis not present

## 2014-08-19 DIAGNOSIS — K602 Anal fissure, unspecified: Secondary | ICD-10-CM | POA: Diagnosis not present

## 2014-08-19 DIAGNOSIS — K6289 Other specified diseases of anus and rectum: Secondary | ICD-10-CM | POA: Diagnosis not present

## 2014-08-20 DIAGNOSIS — E876 Hypokalemia: Secondary | ICD-10-CM | POA: Diagnosis not present

## 2014-08-21 ENCOUNTER — Encounter: Payer: Self-pay | Admitting: Family Medicine

## 2014-08-21 ENCOUNTER — Ambulatory Visit (INDEPENDENT_AMBULATORY_CARE_PROVIDER_SITE_OTHER): Payer: Medicare Other | Admitting: Family Medicine

## 2014-08-21 VITALS — BP 124/82 | HR 74 | Ht 62.0 in | Wt 194.0 lb

## 2014-08-21 DIAGNOSIS — R0981 Nasal congestion: Secondary | ICD-10-CM

## 2014-08-21 DIAGNOSIS — R21 Rash and other nonspecific skin eruption: Secondary | ICD-10-CM | POA: Diagnosis not present

## 2014-08-21 DIAGNOSIS — N644 Mastodynia: Secondary | ICD-10-CM

## 2014-08-21 DIAGNOSIS — E876 Hypokalemia: Secondary | ICD-10-CM

## 2014-08-21 DIAGNOSIS — R1011 Right upper quadrant pain: Secondary | ICD-10-CM

## 2014-08-21 DIAGNOSIS — K5900 Constipation, unspecified: Secondary | ICD-10-CM | POA: Diagnosis not present

## 2014-08-21 DIAGNOSIS — M25511 Pain in right shoulder: Secondary | ICD-10-CM

## 2014-08-21 DIAGNOSIS — M79621 Pain in right upper arm: Secondary | ICD-10-CM

## 2014-08-21 DIAGNOSIS — K5909 Other constipation: Secondary | ICD-10-CM

## 2014-08-21 MED ORDER — POLYETHYLENE GLYCOL 3350 17 GM/SCOOP PO POWD: 17.0000 g | Freq: Two times a day (BID) | ORAL | Status: DC | PRN

## 2014-08-21 NOTE — Progress Notes (Signed)
   Subjective:    Patient ID: Jennifer [name], female    DOB: [birth year], 49 y.o.   MRN: [id number]  HPI  Has a mole on the left temple she would like to have looked at it. Not changing or tender.    Went to the ED for RUQ pain again. She was nauseated. She was given IV fluids.    Has felt dizzy today so stayed in bed this AM.  Says she feels off.  No vomiting. No fever.    Right foot. Rash that is red and scaling x 2 week. Has been itchy.  Not tried any treatments but lotion. No rash on the left foot.  No known triggers.    Constipation - Needs refill on bottle miralax.   Right neck pan- says tight to turn to the right.  Start getting pain for the last several days. No trauma or injury. Still feels like she is congested and getting thick white mucous.  Using her nasal saline.    Right axillary pain and fullness - had [place] and imaging, mammo and it was neg. She still feels a fullness. Has had before. Usually resolves on its own.   Review of Systems     Objective:   Physical Exam  Constitutional: She is oriented to person, place, and time. She appears well-developed and well-nourished.  HENT:  Head: Normocephalic and atraumatic.  Right Ear: External ear normal.  Left Ear: External ear normal.  Nose: Nose normal.  Mouth/Throat: Oropharynx is clear and moist.  TMs and canals are clear.   Eyes: Conjunctivae and EOM are normal. Pupils are equal, round, and reactive to light.  Neck: Neck supple. No thyromegaly present.  Cardiovascular: Normal rate, regular rhythm and normal heart sounds.   Pulmonary/Chest: Effort normal and breath sounds normal. She has no wheezes.  Lymphadenopathy:    She has no cervical adenopathy.  Neurological: She is alert and oriented to person, place, and time.  Skin: Skin is warm and dry.  Dry scaling erythematous rash along the edges of the foot. No open wound. KOH skin scraping collected.    Psychiatric: She has a normal mood and affect.           Assessment & Plan:  Mole on temples can be removed for shave biopsy. She can return at her convenience for this.   RUQ pain - Stil unclear etiology. Has been worked up many times for this.  I still think related to her slow transit bowels/constipation.  Rash on right foot - KOH to rule out tinea pedis. Consider dyshydrotic eczema. If neg will tx with topical steroid.    Chronic constipation - refilled miralax.    Right neck pain - Likey MSK strain. Work on Engineer, materials, gentle stretches.    Right axillary fullness - [place] and mammo normal. Gave reassurance.    Chronic congestion - more likely to be inflammation than infection. Call if color changes, feels worse, has more pain, or fever developes. Continue nasal saline rinse and nasal steroids spry.   Time spent 45 min, > 50% spent counseling about congestion, constipation, neck pain, axillary fullness, rash.

## 2014-08-21 NOTE — Patient Instructions (Signed)
We will call you about the skin scraping on your foot next week.  Can try the OTC  Lamisil cream twice a day until then.   Use your heating pad and stretching. Try to massage the tissue.

## 2014-08-22 LAB — KOH PREP: RESULT - KOH: NONE SEEN

## 2014-08-23 ENCOUNTER — Other Ambulatory Visit: Payer: Self-pay | Admitting: Family Medicine

## 2014-08-23 ENCOUNTER — Encounter: Payer: Self-pay | Admitting: Family Medicine

## 2014-08-23 MED ORDER — CLOBETASOL PROPIONATE 0.05 % EX CREA: 1.0000 "application " | TOPICAL_CREAM | Freq: Every day | CUTANEOUS | Status: DC

## 2014-08-25 DIAGNOSIS — R1084 Generalized abdominal pain: Secondary | ICD-10-CM | POA: Diagnosis not present

## 2014-08-25 DIAGNOSIS — I10 Essential (primary) hypertension: Secondary | ICD-10-CM | POA: Diagnosis not present

## 2014-08-25 DIAGNOSIS — K5900 Constipation, unspecified: Secondary | ICD-10-CM | POA: Diagnosis not present

## 2014-08-25 DIAGNOSIS — G35 Multiple sclerosis: Secondary | ICD-10-CM | POA: Diagnosis not present

## 2014-08-25 DIAGNOSIS — R110 Nausea: Secondary | ICD-10-CM | POA: Diagnosis not present

## 2014-08-25 DIAGNOSIS — R140 Abdominal distension (gaseous): Secondary | ICD-10-CM | POA: Diagnosis not present

## 2014-08-25 DIAGNOSIS — Z87820 Personal history of traumatic brain injury: Secondary | ICD-10-CM | POA: Diagnosis not present

## 2014-08-27 DIAGNOSIS — F330 Major depressive disorder, recurrent, mild: Secondary | ICD-10-CM | POA: Diagnosis not present

## 2014-08-28 DIAGNOSIS — J328 Other chronic sinusitis: Secondary | ICD-10-CM | POA: Diagnosis not present

## 2014-08-31 ENCOUNTER — Ambulatory Visit: Payer: Medicare Other | Admitting: Physician Assistant

## 2014-08-31 ENCOUNTER — Emergency Department (HOSPITAL_BASED_OUTPATIENT_CLINIC_OR_DEPARTMENT_OTHER): Payer: Medicare Other

## 2014-08-31 ENCOUNTER — Emergency Department (HOSPITAL_BASED_OUTPATIENT_CLINIC_OR_DEPARTMENT_OTHER)
Admission: EM | Admit: 2014-08-31 | Discharge: 2014-08-31 | Disposition: A | Discharge status: Home / Self Care | Payer: Medicare Other | Attending: Emergency Medicine | Admitting: Emergency Medicine

## 2014-08-31 ENCOUNTER — Encounter: Payer: Self-pay | Admitting: Family Medicine

## 2014-08-31 DIAGNOSIS — Z8614 Personal history of Methicillin resistant Staphylococcus aureus infection: Secondary | ICD-10-CM | POA: Insufficient documentation

## 2014-08-31 DIAGNOSIS — K219 Gastro-esophageal reflux disease without esophagitis: Secondary | ICD-10-CM | POA: Diagnosis not present

## 2014-08-31 DIAGNOSIS — G35 Multiple sclerosis: Secondary | ICD-10-CM | POA: Diagnosis not present

## 2014-08-31 DIAGNOSIS — Z862 Personal history of diseases of the blood and blood-forming organs and certain disorders involving the immune mechanism: Secondary | ICD-10-CM | POA: Diagnosis not present

## 2014-08-31 DIAGNOSIS — R062 Wheezing: Secondary | ICD-10-CM

## 2014-08-31 DIAGNOSIS — Z87891 Personal history of nicotine dependence: Secondary | ICD-10-CM | POA: Diagnosis not present

## 2014-08-31 DIAGNOSIS — Z8639 Personal history of other endocrine, nutritional and metabolic disease: Secondary | ICD-10-CM | POA: Diagnosis not present

## 2014-08-31 DIAGNOSIS — Z880 Allergy status to penicillin: Secondary | ICD-10-CM | POA: Insufficient documentation

## 2014-08-31 DIAGNOSIS — Z8541 Personal history of malignant neoplasm of cervix uteri: Secondary | ICD-10-CM | POA: Diagnosis not present

## 2014-08-31 DIAGNOSIS — I10 Essential (primary) hypertension: Secondary | ICD-10-CM | POA: Diagnosis not present

## 2014-08-31 DIAGNOSIS — Z79899 Other long term (current) drug therapy: Secondary | ICD-10-CM | POA: Insufficient documentation

## 2014-08-31 DIAGNOSIS — Z8659 Personal history of other mental and behavioral disorders: Secondary | ICD-10-CM | POA: Insufficient documentation

## 2014-08-31 DIAGNOSIS — J45901 Unspecified asthma with (acute) exacerbation: Secondary | ICD-10-CM | POA: Insufficient documentation

## 2014-08-31 NOTE — ED Notes (Signed)
States she was wheezing earlier today. No wheezing on arrival to triage. Sinus drainage. She is suppose to be on an antibiotic but the pharmacy does not have the medicine.
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
## 2014-09-01 ENCOUNTER — Telehealth: Payer: Self-pay | Admitting: Endocrinology

## 2014-09-01 DIAGNOSIS — I499 Cardiac arrhythmia, unspecified: Secondary | ICD-10-CM | POA: Diagnosis not present

## 2014-09-01 DIAGNOSIS — E876 Hypokalemia: Secondary | ICD-10-CM | POA: Diagnosis not present

## 2014-09-01 DIAGNOSIS — E269 Hyperaldosteronism, unspecified: Secondary | ICD-10-CM

## 2014-09-01 DIAGNOSIS — J4520 Mild intermittent asthma, uncomplicated: Secondary | ICD-10-CM | POA: Diagnosis not present

## 2014-09-01 DIAGNOSIS — J309 Allergic rhinitis, unspecified: Secondary | ICD-10-CM | POA: Diagnosis not present

## 2014-09-01 NOTE — Telephone Encounter (Signed)
See below. Labs placed on desk for review.  Thanks!

## 2014-09-01 NOTE — Telephone Encounter (Signed)
Patient would like to know if her labs are back yet   Please advise     Thank You

## 2014-09-01 NOTE — Telephone Encounter (Signed)
We have obtained results: Aldosterone is normal, so you don't have to worry about the sligjhtly low renin. All you need to do is to maintain a normal blood pressure and potassium (which was borderline low). Options are to increase the potassium you are taking, or: Change the HCTZ to triamterene + HCTZ. Please let me know.

## 2014-09-02 NOTE — Telephone Encounter (Signed)
Requested call back to discuss.  

## 2014-09-02 NOTE — Telephone Encounter (Signed)
Patient is calling for the result of her lab work. °

## 2014-09-03 NOTE — Telephone Encounter (Signed)
Requested call back.  

## 2014-09-03 NOTE — Telephone Encounter (Signed)
No, you should conclude you do not have this.  It is ok with me to f/u with dr Suzi Roots. All the 2 of you need to do is to keep you blood pressure and potassium good. I would be happy to see you back here whenever you want.

## 2014-09-03 NOTE — Telephone Encounter (Signed)
Contacted pt and advised of note below. She states that she had not been taking her Potassium or the HCTZ. Pt sates that the HCTZ has dropped her potassium low before and she does not want to take this possible new medication. Also, since her Aldosterone was normal she wanted to know if she is still dx with Hyperaldosteronism?   Please advise, Thanks!

## 2014-09-04 ENCOUNTER — Ambulatory Visit: Payer: Medicare Other | Admitting: Family Medicine

## 2014-09-05 DIAGNOSIS — J019 Acute sinusitis, unspecified: Secondary | ICD-10-CM | POA: Diagnosis not present

## 2014-09-05 DIAGNOSIS — K219 Gastro-esophageal reflux disease without esophagitis: Secondary | ICD-10-CM | POA: Diagnosis not present

## 2014-09-05 DIAGNOSIS — I10 Essential (primary) hypertension: Secondary | ICD-10-CM | POA: Diagnosis not present

## 2014-09-05 DIAGNOSIS — G35 Multiple sclerosis: Secondary | ICD-10-CM | POA: Diagnosis not present

## 2014-09-05 DIAGNOSIS — J018 Other acute sinusitis: Secondary | ICD-10-CM | POA: Diagnosis not present

## 2014-09-07 ENCOUNTER — Telehealth: Payer: Self-pay | Admitting: Family Medicine

## 2014-09-07 DIAGNOSIS — R0602 Shortness of breath: Secondary | ICD-10-CM | POA: Diagnosis not present

## 2014-09-07 DIAGNOSIS — I10 Essential (primary) hypertension: Secondary | ICD-10-CM | POA: Diagnosis not present

## 2014-09-07 DIAGNOSIS — I471 Supraventricular tachycardia: Secondary | ICD-10-CM | POA: Diagnosis not present

## 2014-09-07 DIAGNOSIS — J45909 Unspecified asthma, uncomplicated: Secondary | ICD-10-CM | POA: Diagnosis not present

## 2014-09-07 DIAGNOSIS — Z765 Malingerer [conscious simulation]: Secondary | ICD-10-CM | POA: Diagnosis not present

## 2014-09-07 DIAGNOSIS — Z8542 Personal history of malignant neoplasm of other parts of uterus: Secondary | ICD-10-CM | POA: Diagnosis not present

## 2014-09-07 DIAGNOSIS — K219 Gastro-esophageal reflux disease without esophagitis: Secondary | ICD-10-CM | POA: Diagnosis not present

## 2014-09-07 NOTE — Telephone Encounter (Signed)
Please call patient on her know that her labs done from January 19 at [facility]. Her sodium potassium chloride look fantastic.

## 2014-09-07 NOTE — Telephone Encounter (Signed)
Dialed pt's listed number and was told I had the wrong number. Will try to contact pt at another time.

## 2014-09-08 ENCOUNTER — Telehealth: Payer: Self-pay | Admitting: Endocrinology

## 2014-09-08 ENCOUNTER — Emergency Department (HOSPITAL_BASED_OUTPATIENT_CLINIC_OR_DEPARTMENT_OTHER): Payer: Medicare Other

## 2014-09-08 ENCOUNTER — Encounter (HOSPITAL_BASED_OUTPATIENT_CLINIC_OR_DEPARTMENT_OTHER): Payer: Self-pay | Admitting: Emergency Medicine

## 2014-09-08 ENCOUNTER — Emergency Department (HOSPITAL_BASED_OUTPATIENT_CLINIC_OR_DEPARTMENT_OTHER)
Admission: EM | Admit: 2014-09-08 | Discharge: 2014-09-08 | Disposition: A | Discharge status: Home / Self Care | Payer: Medicare Other | Attending: Emergency Medicine | Admitting: Emergency Medicine

## 2014-09-08 DIAGNOSIS — Z8614 Personal history of Methicillin resistant Staphylococcus aureus infection: Secondary | ICD-10-CM | POA: Diagnosis not present

## 2014-09-08 DIAGNOSIS — Z87891 Personal history of nicotine dependence: Secondary | ICD-10-CM | POA: Diagnosis not present

## 2014-09-08 DIAGNOSIS — G8929 Other chronic pain: Secondary | ICD-10-CM | POA: Insufficient documentation

## 2014-09-08 DIAGNOSIS — K219 Gastro-esophageal reflux disease without esophagitis: Secondary | ICD-10-CM | POA: Insufficient documentation

## 2014-09-08 DIAGNOSIS — K5900 Constipation, unspecified: Secondary | ICD-10-CM | POA: Diagnosis not present

## 2014-09-08 DIAGNOSIS — R0602 Shortness of breath: Secondary | ICD-10-CM | POA: Diagnosis not present

## 2014-09-08 DIAGNOSIS — G4730 Sleep apnea, unspecified: Secondary | ICD-10-CM | POA: Insufficient documentation

## 2014-09-08 DIAGNOSIS — I10 Essential (primary) hypertension: Secondary | ICD-10-CM | POA: Diagnosis not present

## 2014-09-08 DIAGNOSIS — Z862 Personal history of diseases of the blood and blood-forming organs and certain disorders involving the immune mechanism: Secondary | ICD-10-CM | POA: Insufficient documentation

## 2014-09-08 DIAGNOSIS — R0982 Postnasal drip: Secondary | ICD-10-CM | POA: Insufficient documentation

## 2014-09-08 DIAGNOSIS — Z9981 Dependence on supplemental oxygen: Secondary | ICD-10-CM | POA: Diagnosis not present

## 2014-09-08 DIAGNOSIS — Z79899 Other long term (current) drug therapy: Secondary | ICD-10-CM | POA: Diagnosis not present

## 2014-09-08 DIAGNOSIS — J45909 Unspecified asthma, uncomplicated: Secondary | ICD-10-CM | POA: Insufficient documentation

## 2014-09-08 MED ORDER — POLYETHYLENE GLYCOL 3350 17 GM/SCOOP PO POWD: 17.0000 g | Freq: Every day | ORAL | Status: DC

## 2014-09-08 MED ORDER — GUAIFENESIN ER 600 MG PO TB12: 600.0000 mg | ORAL_TABLET | Freq: Two times a day (BID) | ORAL | Status: DC

## 2014-09-08 MED ORDER — DEXAMETHASONE 1 MG PO TABS: 1.0000 mg | ORAL_TABLET | Freq: Two times a day (BID) | ORAL | Status: DC

## 2014-09-08 MED ORDER — BENZONATATE 100 MG PO CAPS: 100.0000 mg | ORAL_CAPSULE | Freq: Three times a day (TID) | ORAL | Status: DC

## 2014-09-08 NOTE — Telephone Encounter (Signed)
Contacted pt and advised of note below. Pt stated that at her last ov it was mentioned she may need her Cortisol levels checked. Should the pt still have this done? Please advise, Thanks!

## 2014-09-08 NOTE — ED Notes (Addendum)
MD at bedside discussing test results and dispo plan of care. 

## 2014-09-08 NOTE — Telephone Encounter (Signed)
lvm w/ results.[name] [place] [employer]
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
## 2014-09-08 NOTE — Telephone Encounter (Signed)
you can do a "dexamethasone suppression test."  for this, you would take dexamethasone 1 mg at 10 pm, then come in for a "cortisol" blood test the next morning before 9 am.  you do not need to be fasting for this test. i have sent a prescription to your pharmacy, for the pill.

## 2014-09-08 NOTE — ED Notes (Signed)
Patient transported to X-ray 

## 2014-09-08 NOTE — Telephone Encounter (Signed)
Pharmacy advised to dispense 1 pill of the Decadron.

## 2014-09-08 NOTE — Telephone Encounter (Signed)
Unable to reach pt. Will try again at a later time.  

## 2014-09-08 NOTE — Discharge Instructions (Signed)
Constipation  Constipation is when a person:  · Poops (has a bowel movement) less than 3 times a week.  · Has a hard time pooping.  · Has poop that is dry, hard, or bigger than normal.  HOME CARE   · Eat foods with a lot of fiber in them. This includes fruits, vegetables, beans, and whole grains such as brown rice.  · Avoid fatty foods and foods with a lot of sugar. This includes french fries, hamburgers, cookies, candy, and soda.  · If you are not getting enough fiber from food, take products with added fiber in them (supplements).  · Drink enough fluid to keep your pee (urine) clear or pale yellow.  · Exercise on a regular basis, or as told by your doctor.  · Go to the restroom when you feel like you need to poop. Do not hold it.  · Only take medicine as told by your doctor. Do not take medicines that help you poop (laxatives) without talking to your doctor first.  GET HELP RIGHT AWAY IF:   · You have bright red blood in your poop (stool).  · Your constipation lasts more than 4 days or gets worse.  · You have belly (abdominal) or butt (rectal) pain.  · You have thin poop (as thin as a pencil).  · You lose weight, and it cannot be explained.  MAKE SURE YOU:   · Understand these instructions.  · Will watch your condition.  · Will get help right away if you are not doing well or get worse.  Document Released: 01/17/2008 Document Revised: 08/05/2013 Document Reviewed: 05/12/2013  ExitCare® Patient Information ©2015 [employer]. This information is not intended to replace advice given to you by your health care provider. Make sure you discuss any questions you have with your health care provider.

## 2014-09-08 NOTE — Telephone Encounter (Signed)
Pharmacy needs dosage clarification on Decadron rx speak with [facility] [phone]

## 2014-09-08 NOTE — Telephone Encounter (Signed)
Pt advised of note below and voiced understanding. Per pt's request she will have labs done at [facility]. Orders faxed to lab.

## 2014-09-08 NOTE — ED Notes (Addendum)
Sinus congestion and pressure with drainage for over a month. For a few days she has been having pain in her throat and neck and having pain with a deep breath.  Pt taking Levaquin from [facility] ED.

## 2014-09-09 ENCOUNTER — Encounter: Payer: Self-pay | Admitting: Family Medicine

## 2014-09-09 IMAGING — CR DG ANKLE COMPLETE 3+V*L*
3 series · 3 of 3 positions shown · non-contrast
Comparison: None.

CLINICAL DATA: Pain

EXAM:
LEFT ANKLE COMPLETE - 3+ VIEW

[t ankle joint ap left]
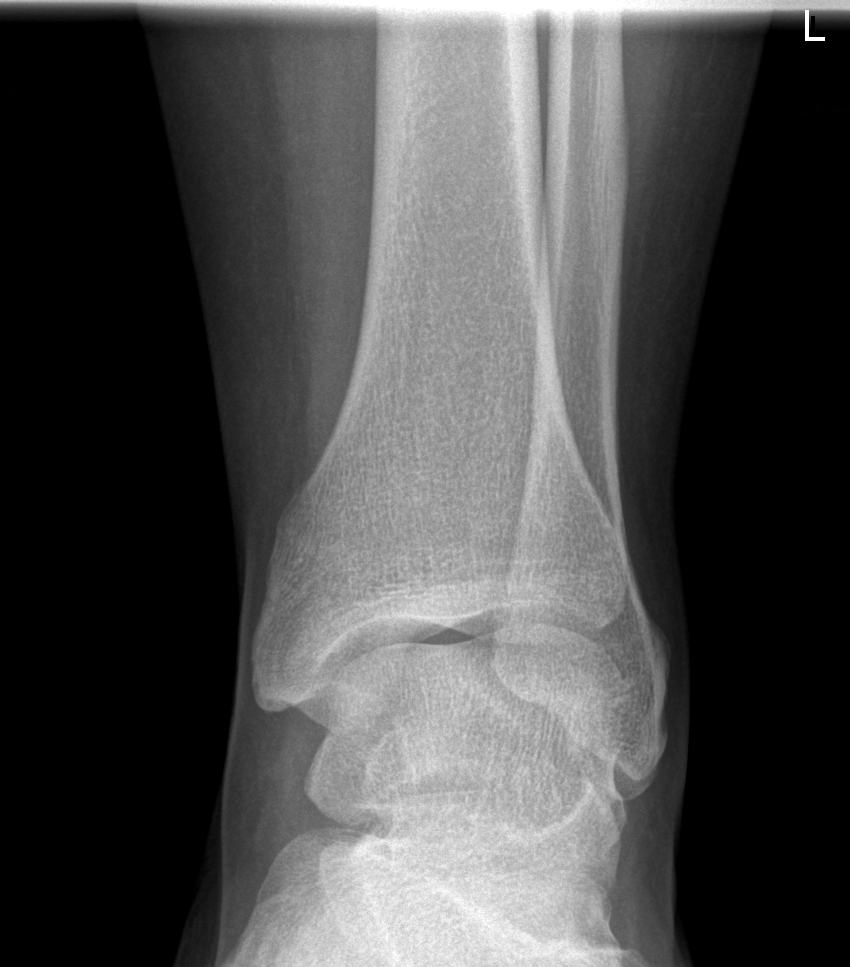

[t ankle joint oblique left]
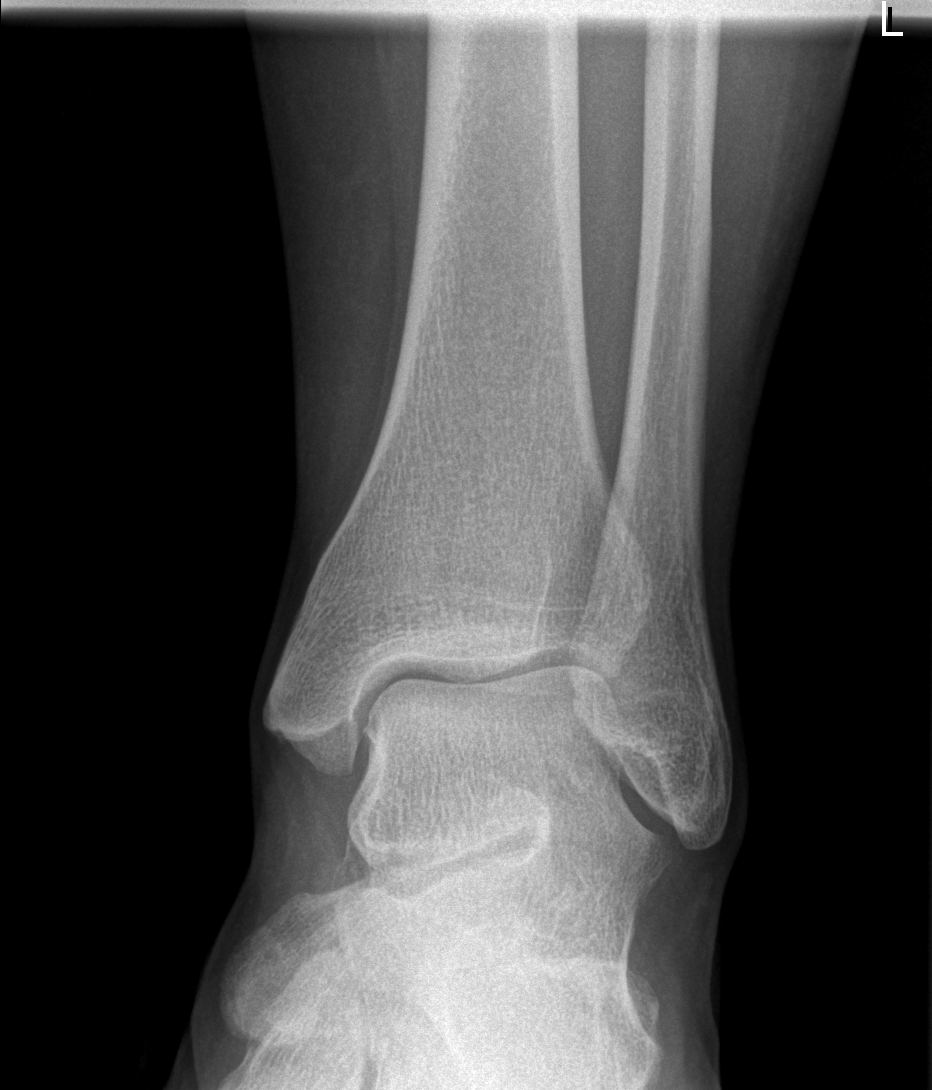

[t ankle joint lat left]
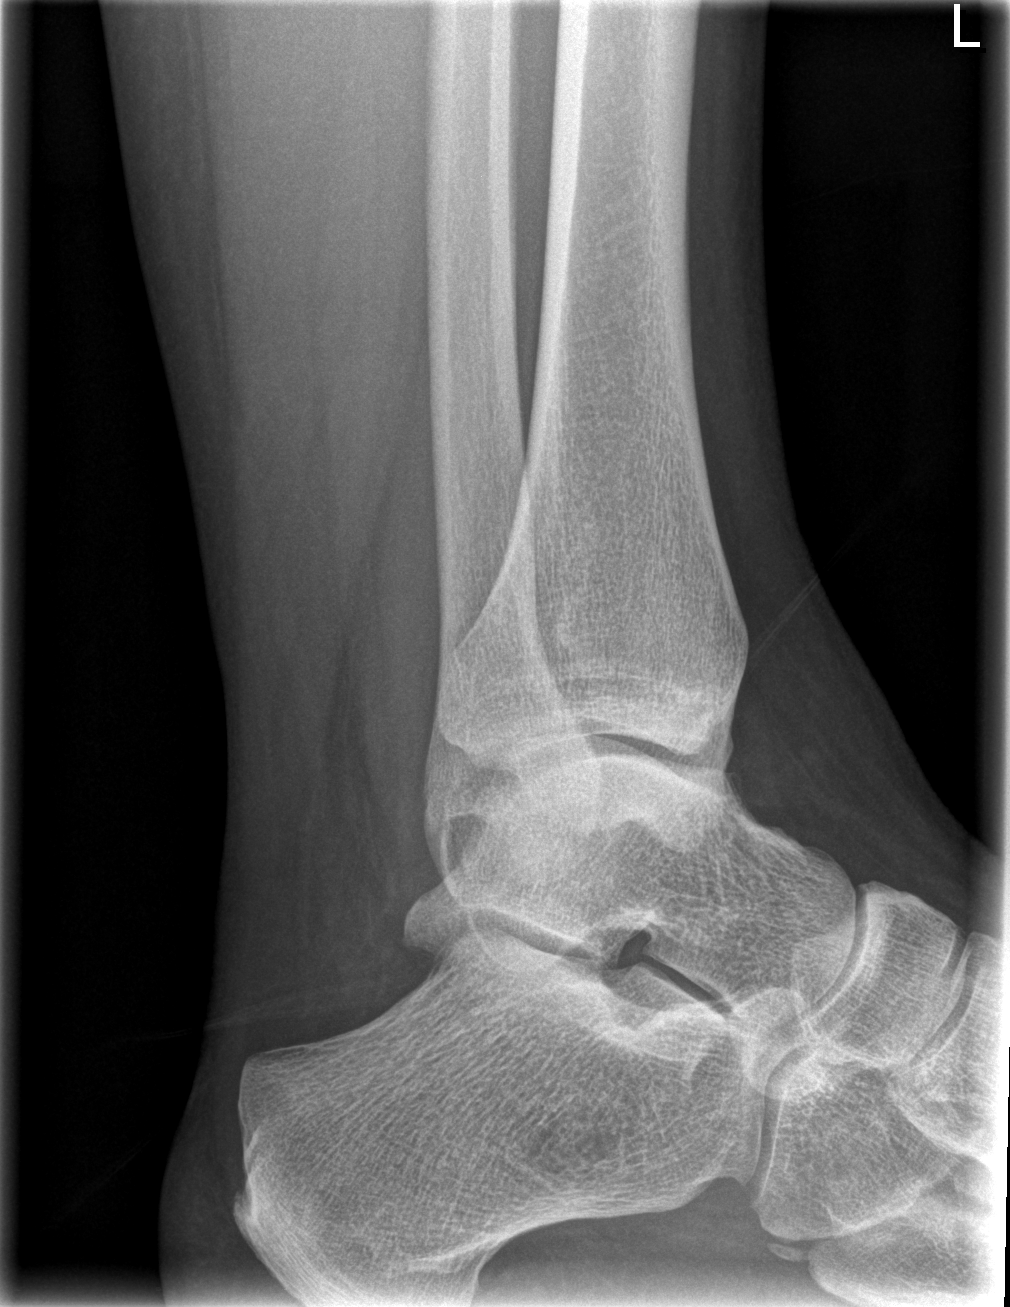

[3 of 3 positions shown; findings below may reference images not displayed]

FINDINGS: Frontal, oblique, and lateral views were obtained. There is no
fracture or effusion. Ankle mortise appears intact. No erosive
change.
IMPRESSION: No fracture or appreciable arthropathy. Mortise appears intact.

## 2014-09-10 ENCOUNTER — Encounter: Payer: Self-pay | Admitting: Family Medicine

## 2014-09-10 ENCOUNTER — Ambulatory Visit (INDEPENDENT_AMBULATORY_CARE_PROVIDER_SITE_OTHER): Payer: Medicare Other | Admitting: Family Medicine

## 2014-09-10 VITALS — BP 143/81 | HR 116 | Ht 62.0 in | Wt 196.0 lb

## 2014-09-10 DIAGNOSIS — D2239 Melanocytic nevi of other parts of face: Secondary | ICD-10-CM

## 2014-09-10 DIAGNOSIS — E269 Hyperaldosteronism, unspecified: Secondary | ICD-10-CM | POA: Diagnosis not present

## 2014-09-10 DIAGNOSIS — E876 Hypokalemia: Secondary | ICD-10-CM | POA: Diagnosis not present

## 2014-09-10 DIAGNOSIS — K5900 Constipation, unspecified: Secondary | ICD-10-CM | POA: Diagnosis not present

## 2014-09-10 DIAGNOSIS — D229 Melanocytic nevi, unspecified: Secondary | ICD-10-CM

## 2014-09-10 DIAGNOSIS — K5909 Other constipation: Secondary | ICD-10-CM

## 2014-09-10 DIAGNOSIS — L259 Unspecified contact dermatitis, unspecified cause: Secondary | ICD-10-CM | POA: Diagnosis not present

## 2014-09-10 DIAGNOSIS — D320 Benign neoplasm of cerebral meninges: Secondary | ICD-10-CM | POA: Diagnosis not present

## 2014-09-10 LAB — BASIC METABOLIC PANEL
BUN: 11 mg/dL (ref 4–21)
Creatinine: 0.6 mg/dL (ref 0.5–1.1)
Potassium: 3.9 mmol/L (ref 3.4–5.3)
Sodium: 142 mmol/L (ref 137–147)

## 2014-09-10 MED ORDER — MAGNESIUM CITRATE PO SOLN: 150.0000 mL | Freq: Every day | ORAL | Status: DC | PRN

## 2014-09-10 MED ORDER — HYDROCORTISONE 2.5 % RE CREA
1.0000 | TOPICAL_CREAM | Freq: Two times a day (BID) | RECTAL | Status: DC
Start: 2014-09-10 — End: 2014-11-26

## 2014-09-10 NOTE — Progress Notes (Signed)
   Subjective:    Patient ID: Jennifer [name], female    DOB: 03/04/1966, 49 y.o.   MRN: [id number]  HPI Patient said for biopsy of lesion on her left temple. She feels like it's been changing recently.   Constipation-She says she's having a really hard time doing the MiraLAX. She feels like the grittiness of it is making it difficult for her to swallow it. So she had switched to milk of magnesia for the last 3 days and has used a glycerin suppository and has not had a bowel movement. She is getting more bloated and feels more full.   Review of Systems     Objective:   Physical Exam  Constitutional: She is oriented to person, place, and time. She appears well-developed and well-nourished.  HENT:  Head: Normocephalic and atraumatic.  Neurological: She is alert and oriented to person, place, and time.  Skin: Skin is warm and dry.  Small 6 mm hyperpigmented papules on the left temple   Psychiatric: She has a normal mood and affect. Her behavior is normal.          Assessment & Plan:  Atypical nevus-shave biopsy performed as below. Patient tolerated well. Follow-up when care discussed. Next  Chronic constipation-recommend a trial of magnesium citrate. After that we can also consider one of the prescription medication such as Amitiza or Linzess. She also has a prescription of lactulose at home this can also be used. She would also like to rectal cream called in to the pharmacy because she is getting irritation from her bowel movements.  Shave Biopsy Procedure Note  Pre-operative Diagnosis: Dysplastic nevus  Post-operative Diagnosis: same  Locations:left temple  Indications: atypical nevus, has been getting larger.   Anesthesia: Lidocaine 1% with epinephrine    Procedure Details  History of allergy to iodine: no  Patient informed of the risks (including bleeding and infection) and benefits of the  procedure and Verbal informed consent obtained.  The lesion and  surrounding area were given a sterile prep using chlorhexidine and draped in the usual sterile fashion. A scalpel was used to shave an area of skin approximately 1cm by 1cm.  Hemostasis achieved with alumuninum chloride. Antibiotic ointment and a sterile dressing applied.  The specimen was sent for pathologic examination. The patient tolerated the procedure well.  EBL: 0 ml  Findings: Await pathology  Condition: Stable  Complications: none.  Plan: 1. Instructed to keep the wound dry and covered for 24-48h and clean thereafter. 2. Warning signs of infection were reviewed.   3. Recommended that the patient use OTC acetaminophen as needed for pain.  4. Return PRN.

## 2014-09-10 NOTE — Patient Instructions (Signed)
For your constipation we will try magnesium citrate. Drink about 150 mLs daily until you feel like you're having good soft bowel movements. This is just to get things moving. This will not be a maintenance regimen. We can always consider a prescription such as Amitiza or Linzess which are both FDA approved for chronic constipation. They are more maintenance drugs. You can also use the lactulose that you do have at home as needed. Again this would not be for maintenance but could be used from time to time to get her bowels moving.  Keep wound covered until tomorrow. Okay to get wet in the shower. Do not scrub at the wound. Do not use alcohol or peroxide on the wound. Just pat dry after shower. Apply a little bit of Vaseline daily for the next 2-3 weeks until healing is complete. You do not need to cover with a Band-Aid and less desired. We will call you once the biopsy results are available.

## 2014-09-11 DIAGNOSIS — Z7952 Long term (current) use of systemic steroids: Secondary | ICD-10-CM | POA: Diagnosis not present

## 2014-09-11 DIAGNOSIS — R110 Nausea: Secondary | ICD-10-CM | POA: Diagnosis not present

## 2014-09-11 DIAGNOSIS — Z882 Allergy status to sulfonamides status: Secondary | ICD-10-CM | POA: Diagnosis not present

## 2014-09-11 DIAGNOSIS — R109 Unspecified abdominal pain: Secondary | ICD-10-CM | POA: Diagnosis not present

## 2014-09-11 DIAGNOSIS — I10 Essential (primary) hypertension: Secondary | ICD-10-CM | POA: Diagnosis not present

## 2014-09-11 DIAGNOSIS — Z880 Allergy status to penicillin: Secondary | ICD-10-CM | POA: Diagnosis not present

## 2014-09-11 DIAGNOSIS — D509 Iron deficiency anemia, unspecified: Secondary | ICD-10-CM | POA: Diagnosis not present

## 2014-09-11 DIAGNOSIS — K2270 Barrett's esophagus without dysplasia: Secondary | ICD-10-CM | POA: Diagnosis not present

## 2014-09-11 DIAGNOSIS — E2609 Other primary hyperaldosteronism: Secondary | ICD-10-CM | POA: Diagnosis not present

## 2014-09-11 DIAGNOSIS — E785 Hyperlipidemia, unspecified: Secondary | ICD-10-CM | POA: Diagnosis not present

## 2014-09-11 DIAGNOSIS — Z87891 Personal history of nicotine dependence: Secondary | ICD-10-CM | POA: Diagnosis not present

## 2014-09-11 DIAGNOSIS — Z881 Allergy status to other antibiotic agents status: Secondary | ICD-10-CM | POA: Diagnosis not present

## 2014-09-11 DIAGNOSIS — K5900 Constipation, unspecified: Secondary | ICD-10-CM | POA: Diagnosis not present

## 2014-09-11 DIAGNOSIS — L259 Unspecified contact dermatitis, unspecified cause: Secondary | ICD-10-CM | POA: Diagnosis not present

## 2014-09-11 DIAGNOSIS — G35 Multiple sclerosis: Secondary | ICD-10-CM | POA: Diagnosis not present

## 2014-09-11 DIAGNOSIS — I471 Supraventricular tachycardia: Secondary | ICD-10-CM | POA: Diagnosis not present

## 2014-09-11 DIAGNOSIS — Z885 Allergy status to narcotic agent status: Secondary | ICD-10-CM | POA: Diagnosis not present

## 2014-09-11 DIAGNOSIS — Z889 Allergy status to unspecified drugs, medicaments and biological substances status: Secondary | ICD-10-CM | POA: Diagnosis not present

## 2014-09-11 DIAGNOSIS — Z7951 Long term (current) use of inhaled steroids: Secondary | ICD-10-CM | POA: Diagnosis not present

## 2014-09-11 DIAGNOSIS — Z91048 Other nonmedicinal substance allergy status: Secondary | ICD-10-CM | POA: Diagnosis not present

## 2014-09-11 DIAGNOSIS — R569 Unspecified convulsions: Secondary | ICD-10-CM | POA: Diagnosis not present

## 2014-09-11 DIAGNOSIS — F419 Anxiety disorder, unspecified: Secondary | ICD-10-CM | POA: Diagnosis not present

## 2014-09-11 DIAGNOSIS — J45909 Unspecified asthma, uncomplicated: Secondary | ICD-10-CM | POA: Diagnosis not present

## 2014-09-11 DIAGNOSIS — K219 Gastro-esophageal reflux disease without esophagitis: Secondary | ICD-10-CM | POA: Diagnosis not present

## 2014-09-11 DIAGNOSIS — Z791 Long term (current) use of non-steroidal anti-inflammatories (NSAID): Secondary | ICD-10-CM | POA: Diagnosis not present

## 2014-09-11 DIAGNOSIS — Z79899 Other long term (current) drug therapy: Secondary | ICD-10-CM | POA: Diagnosis not present

## 2014-09-11 DIAGNOSIS — G43909 Migraine, unspecified, not intractable, without status migrainosus: Secondary | ICD-10-CM | POA: Diagnosis not present

## 2014-09-13 DIAGNOSIS — I10 Essential (primary) hypertension: Secondary | ICD-10-CM | POA: Diagnosis not present

## 2014-09-13 DIAGNOSIS — G35 Multiple sclerosis: Secondary | ICD-10-CM | POA: Diagnosis not present

## 2014-09-13 DIAGNOSIS — R7309 Other abnormal glucose: Secondary | ICD-10-CM | POA: Diagnosis not present

## 2014-09-13 DIAGNOSIS — K5900 Constipation, unspecified: Secondary | ICD-10-CM | POA: Diagnosis not present

## 2014-09-13 DIAGNOSIS — R109 Unspecified abdominal pain: Secondary | ICD-10-CM | POA: Diagnosis not present

## 2014-09-13 DIAGNOSIS — R1084 Generalized abdominal pain: Secondary | ICD-10-CM | POA: Diagnosis not present

## 2014-09-13 DIAGNOSIS — K5901 Slow transit constipation: Secondary | ICD-10-CM | POA: Diagnosis not present

## 2014-09-13 DIAGNOSIS — Z87820 Personal history of traumatic brain injury: Secondary | ICD-10-CM | POA: Diagnosis not present

## 2014-09-15 ENCOUNTER — Encounter: Payer: Self-pay | Admitting: Family Medicine

## 2014-09-18 DIAGNOSIS — K2970 Gastritis, unspecified, without bleeding: Secondary | ICD-10-CM | POA: Diagnosis not present

## 2014-09-18 DIAGNOSIS — K200 Eosinophilic esophagitis: Secondary | ICD-10-CM | POA: Diagnosis not present

## 2014-09-18 DIAGNOSIS — R1310 Dysphagia, unspecified: Secondary | ICD-10-CM | POA: Diagnosis not present

## 2014-09-18 DIAGNOSIS — K2980 Duodenitis without bleeding: Secondary | ICD-10-CM | POA: Diagnosis not present

## 2014-09-20 ENCOUNTER — Encounter: Payer: Self-pay | Admitting: Emergency Medicine

## 2014-09-22 ENCOUNTER — Encounter: Payer: Self-pay | Admitting: Endocrinology

## 2014-09-22 ENCOUNTER — Emergency Department (HOSPITAL_BASED_OUTPATIENT_CLINIC_OR_DEPARTMENT_OTHER)
Admission: EM | Admit: 2014-09-22 | Discharge: 2014-09-22 | Disposition: A | Discharge status: Home / Self Care | Payer: Medicare Other | Attending: Emergency Medicine | Admitting: Emergency Medicine

## 2014-09-22 ENCOUNTER — Encounter (HOSPITAL_BASED_OUTPATIENT_CLINIC_OR_DEPARTMENT_OTHER): Payer: Self-pay

## 2014-09-22 ENCOUNTER — Emergency Department (HOSPITAL_BASED_OUTPATIENT_CLINIC_OR_DEPARTMENT_OTHER): Payer: Medicare Other

## 2014-09-22 DIAGNOSIS — Z9981 Dependence on supplemental oxygen: Secondary | ICD-10-CM | POA: Insufficient documentation

## 2014-09-22 DIAGNOSIS — R0981 Nasal congestion: Secondary | ICD-10-CM

## 2014-09-22 DIAGNOSIS — R05 Cough: Secondary | ICD-10-CM | POA: Insufficient documentation

## 2014-09-22 DIAGNOSIS — Z862 Personal history of diseases of the blood and blood-forming organs and certain disorders involving the immune mechanism: Secondary | ICD-10-CM | POA: Diagnosis not present

## 2014-09-22 DIAGNOSIS — Z79899 Other long term (current) drug therapy: Secondary | ICD-10-CM | POA: Insufficient documentation

## 2014-09-22 DIAGNOSIS — Z87891 Personal history of nicotine dependence: Secondary | ICD-10-CM | POA: Diagnosis not present

## 2014-09-22 DIAGNOSIS — J45909 Unspecified asthma, uncomplicated: Secondary | ICD-10-CM | POA: Diagnosis not present

## 2014-09-22 DIAGNOSIS — Z8659 Personal history of other mental and behavioral disorders: Secondary | ICD-10-CM | POA: Diagnosis not present

## 2014-09-22 DIAGNOSIS — G8929 Other chronic pain: Secondary | ICD-10-CM | POA: Insufficient documentation

## 2014-09-22 DIAGNOSIS — Z792 Long term (current) use of antibiotics: Secondary | ICD-10-CM | POA: Diagnosis not present

## 2014-09-22 DIAGNOSIS — I10 Essential (primary) hypertension: Secondary | ICD-10-CM | POA: Insufficient documentation

## 2014-09-22 DIAGNOSIS — Z8542 Personal history of malignant neoplasm of other parts of uterus: Secondary | ICD-10-CM | POA: Diagnosis not present

## 2014-09-22 DIAGNOSIS — Z880 Allergy status to penicillin: Secondary | ICD-10-CM | POA: Insufficient documentation

## 2014-09-22 DIAGNOSIS — Z8614 Personal history of Methicillin resistant Staphylococcus aureus infection: Secondary | ICD-10-CM | POA: Diagnosis not present

## 2014-09-22 DIAGNOSIS — Z8639 Personal history of other endocrine, nutritional and metabolic disease: Secondary | ICD-10-CM | POA: Insufficient documentation

## 2014-09-22 DIAGNOSIS — K5901 Slow transit constipation: Secondary | ICD-10-CM | POA: Diagnosis not present

## 2014-09-22 DIAGNOSIS — Z8739 Personal history of other diseases of the musculoskeletal system and connective tissue: Secondary | ICD-10-CM | POA: Diagnosis not present

## 2014-09-22 DIAGNOSIS — R059 Cough, unspecified: Secondary | ICD-10-CM
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
## 2014-09-22 NOTE — ED Notes (Signed)
Reports thick mucus and congestion in her nose. Reports trying to "rinse" her nose numerous times.

## 2014-09-22 NOTE — Discharge Instructions (Signed)
Upper Respiratory Infection, Adult An upper respiratory infection (URI) is also sometimes known as the common cold. The upper respiratory tract includes the nose, sinuses, throat, trachea, and bronchi. Bronchi are the airways leading to the lungs. Most people improve within 1 week, but symptoms can last up to 2 weeks. A residual cough may last even longer.  CAUSES Many different viruses can infect the tissues lining the upper respiratory tract. The tissues become irritated and inflamed and often become very moist. Mucus production is also common. A cold is contagious. You can easily spread the virus to others by oral contact. This includes kissing, sharing a glass, coughing, or sneezing. Touching your mouth or nose and then touching a surface, which is then touched by another person, can also spread the virus. SYMPTOMS  Symptoms typically develop 1 to 3 days after you come in contact with a cold virus. Symptoms vary from person to person. They may include:  Runny nose.  Sneezing.  Nasal congestion.  Sinus irritation.  Sore throat.  Loss of voice (laryngitis).  Cough.  Fatigue.  Muscle aches.  Loss of appetite.  Headache.  Low-grade fever. DIAGNOSIS  You might diagnose your own cold based on familiar symptoms, since most people get a cold 2 to 3 times a year. Your caregiver can confirm this based on your exam. Most importantly, your caregiver can check that your symptoms are not due to another disease such as strep throat, sinusitis, pneumonia, asthma, or epiglottitis. Blood tests, throat tests, and X-rays are not necessary to diagnose a common cold, but they may sometimes be helpful in excluding other more serious diseases. Your caregiver will decide if any further tests are required. RISKS AND COMPLICATIONS  You may be at risk for a more severe case of the common cold if you smoke cigarettes, have chronic heart disease (such as heart failure) or lung disease (such as asthma), or if  you have a weakened immune system. The very young and very old are also at risk for more serious infections. Bacterial sinusitis, middle ear infections, and bacterial pneumonia can complicate the common cold. The common cold can worsen asthma and chronic obstructive pulmonary disease (COPD). Sometimes, these complications can require emergency medical care and may be life-threatening. PREVENTION  The best way to protect against getting a cold is to practice good hygiene. Avoid oral or hand contact with people with cold symptoms. Wash your hands often if contact occurs. There is no clear evidence that vitamin C, vitamin E, echinacea, or exercise reduces the chance of developing a cold. However, it is always recommended to get plenty of rest and practice good nutrition. TREATMENT  Treatment is directed at relieving symptoms. There is no cure. Antibiotics are not effective, because the infection is caused by a virus, not by bacteria. Treatment may include:  Increased fluid intake. Sports drinks offer valuable electrolytes, sugars, and fluids.  Breathing heated mist or steam (vaporizer or shower).  Eating chicken soup or other clear broths, and maintaining good nutrition.  Getting plenty of rest.  Using gargles or lozenges for comfort.  Controlling fevers with ibuprofen or acetaminophen as directed by your caregiver.  Increasing usage of your inhaler if you have asthma. Zinc gel and zinc lozenges, taken in the first 24 hours of the common cold, can shorten the duration and lessen the severity of symptoms. Pain medicines may help with fever, muscle aches, and throat pain. A variety of non-prescription medicines are available to treat congestion and runny nose. Your caregiver   can make recommendations and may suggest nasal or lung inhalers for other symptoms.  HOME CARE INSTRUCTIONS   Only take over-the-counter or prescription medicines for pain, discomfort, or fever as directed by your  caregiver.  Use a warm mist humidifier or inhale steam from a shower to increase air moisture. This may keep secretions moist and make it easier to breathe.  Drink enough water and fluids to keep your urine clear or pale yellow.  Rest as needed.  Return to work when your temperature has returned to normal or as your caregiver advises. You may need to stay home longer to avoid infecting others. You can also use a face mask and careful hand washing to prevent spread of the virus. SEEK MEDICAL CARE IF:   After the first few days, you feel you are getting worse rather than better.  You need your caregiver's advice about medicines to control symptoms.  You develop chills, worsening shortness of breath, or brown or red sputum. These may be signs of pneumonia.  You develop yellow or brown nasal discharge or pain in the face, especially when you bend forward. These may be signs of sinusitis.  You develop a fever, swollen neck glands, pain with swallowing, or white areas in the back of your throat. These may be signs of strep throat. SEEK IMMEDIATE MEDICAL CARE IF:   You have a fever.  You develop severe or persistent headache, ear pain, sinus pain, or chest pain.  You develop wheezing, a prolonged cough, cough up blood, or have a change in your usual mucus (if you have chronic lung disease).  You develop sore muscles or a stiff neck. Document Released: 01/24/2001 Document Revised: 10/23/2011 Document Reviewed: 11/05/2013 ExitCare Patient Information 2015 [employer]. This information is not intended to replace advice given to you by your health care provider. Make sure you discuss any questions you have with your health care provider.  

## 2014-09-24 ENCOUNTER — Ambulatory Visit (INDEPENDENT_AMBULATORY_CARE_PROVIDER_SITE_OTHER): Payer: Medicare Other | Admitting: Family Medicine

## 2014-09-24 ENCOUNTER — Encounter: Payer: Self-pay | Admitting: Family Medicine

## 2014-09-24 VITALS — BP 137/86 | HR 95 | Wt 198.0 lb

## 2014-09-24 DIAGNOSIS — M24551 Contracture, right hip: Secondary | ICD-10-CM | POA: Diagnosis not present

## 2014-09-24 DIAGNOSIS — R0981 Nasal congestion: Secondary | ICD-10-CM

## 2014-09-24 DIAGNOSIS — K5900 Constipation, unspecified: Secondary | ICD-10-CM

## 2014-09-24 DIAGNOSIS — M7989 Other specified soft tissue disorders: Secondary | ICD-10-CM

## 2014-09-24 DIAGNOSIS — K5909 Other constipation: Secondary | ICD-10-CM

## 2014-09-24 NOTE — Progress Notes (Signed)
   Subjective:    Patient ID: Jennifer [name], female    DOB: 01-Jun-1966, 49 y.o.   MRN: [id number]  HPI   Still having some sinus congrstion. Says took the antibiotic. Says some better but still residual sxs. No fever.  Mostly nasal congestion, post nasal drip, thick, occ causing cough.   Constipation - tried the miralax but says it makes her throat feel thick and sto stopped it. Says the sensation will last for several minutes. Thinks may be having a reaction to the medication.    Her hands have been swelling in her hands every night for the last wek or so.  Says has been eating some salt foods.  Hands feel tight, like hard to flex fingers. No trauma or injury. No redness of joints.    Has had a hard time lifting her leg up to put her socks on . Says just feels stiff in her hips. Moreso on right compared to left.  No trauam or injury. Has gained weight.    Review of Systems     Objective:   Physical Exam  Constitutional: She is oriented to person, place, and time. She appears well-developed and well-nourished.  HENT:  Head: Normocephalic and atraumatic.  Cardiovascular: Normal rate, regular rhythm and normal heart sounds.   Pulmonary/Chest: Effort normal and breath sounds normal.  Abdominal: Soft. Bowel sounds are normal. She exhibits no distension and no mass.  Musculoskeletal:  Hands with no distinct swelling, erythema. NROM of finger. nontender over the joints.   Neurological: She is alert and oriented to person, place, and time.  Skin: Skin is warm and dry.  Psychiatric: She has a normal mood and affect. Her behavior is normal.          Assessment & Plan:  Chronic sinusitis - secondary toinflammation. No likely bacterial. Continue symptomatic care.   Constipation - can use stimulant laxatives if needed. Work on [employer]. Diet low in veggies and fruits.    Hand swelling- like secondary to inc salt intake and edema. No sign of joint inflamation or overall volume  overload.    Hip flexor  tightness/dec flexion mobiliyt- work on ROM. Can consider PT as well.

## 2014-09-25 ENCOUNTER — Telehealth: Payer: Self-pay

## 2014-09-25 NOTE — Telephone Encounter (Signed)
The patient called 09/23/14. She said several years ago during an office visit with Debbrah Alar she provided old records from [facility] from when she was around [age] years old.  She brought them in to show that she had previous history of seizures.  She stated she specifically told them to NOT scan the documents into her chart.  She noticed one of the nurses copying the records. She felt certain they had scanned the records into her chart and wants them removed. She also mentioned that she had "Break the Glass" on her chart previously and now it was not on her chart.  She wants it placed back on her chart.    I told Ms. Cowman I would research to find out what action should be taken and call her back.  After extensive research, I could not locate these records in her chart. I then called Demetra Shiner, Manufacturing systems engineer, to explain the situation. She recalled talking to Ms. Daponte previously about this and also confirmed that she couldn't locate the records in question. She had mailed an Amendment Request to Ms. Markos in October 2014. Lovey Newcomer also informed me that "Break the Glass" is put on a chart during registration.   I called Ms. Locatelli back on Friday, 09/25/2014 and informed her of my findings. I told her I would mail an amendment request form to complete,even though I could not locate these records in her chart. I explained the next time she came for a visit at any [facility] facility she could request the registration person to mark her chart as private "break the glass".    Throughout both conversations with Ms. Sobel she mentioned Abigail Butts at Dr. Gardiner Ramus office as the manager who has tried to help her with this and couldn't figure out how to put break the glass on the chart.  I told Ms. Santillano I would email Abigail Butts what I found out to see if she can make sure her and staff receive training to add break the glass.   The Amendment Request form was placed in the mail 09/25/14.

## 2014-09-28 ENCOUNTER — Encounter: Payer: Self-pay | Admitting: Family Medicine

## 2014-09-29 ENCOUNTER — Encounter (HOSPITAL_BASED_OUTPATIENT_CLINIC_OR_DEPARTMENT_OTHER): Payer: Self-pay | Admitting: *Deleted

## 2014-09-29 ENCOUNTER — Emergency Department (HOSPITAL_BASED_OUTPATIENT_CLINIC_OR_DEPARTMENT_OTHER)
Admission: EM | Admit: 2014-09-29 | Discharge: 2014-09-30 | Disposition: A | Discharge status: Home / Self Care | Payer: Medicare Other | Attending: Emergency Medicine | Admitting: Emergency Medicine

## 2014-09-29 ENCOUNTER — Emergency Department (HOSPITAL_BASED_OUTPATIENT_CLINIC_OR_DEPARTMENT_OTHER): Payer: Medicare Other

## 2014-09-29 DIAGNOSIS — Z87891 Personal history of nicotine dependence: Secondary | ICD-10-CM | POA: Insufficient documentation

## 2014-09-29 DIAGNOSIS — I10 Essential (primary) hypertension: Secondary | ICD-10-CM | POA: Insufficient documentation

## 2014-09-29 DIAGNOSIS — J45901 Unspecified asthma with (acute) exacerbation: Secondary | ICD-10-CM | POA: Insufficient documentation

## 2014-09-29 DIAGNOSIS — Z8659 Personal history of other mental and behavioral disorders: Secondary | ICD-10-CM | POA: Insufficient documentation

## 2014-09-29 DIAGNOSIS — Z9981 Dependence on supplemental oxygen: Secondary | ICD-10-CM | POA: Insufficient documentation

## 2014-09-29 DIAGNOSIS — Z79899 Other long term (current) drug therapy: Secondary | ICD-10-CM | POA: Diagnosis not present

## 2014-09-29 DIAGNOSIS — Z9851 Tubal ligation status: Secondary | ICD-10-CM | POA: Diagnosis not present

## 2014-09-29 DIAGNOSIS — G8929 Other chronic pain: Secondary | ICD-10-CM | POA: Diagnosis not present

## 2014-09-29 DIAGNOSIS — Z8542 Personal history of malignant neoplasm of other parts of uterus: Secondary | ICD-10-CM | POA: Insufficient documentation

## 2014-09-29 DIAGNOSIS — K5900 Constipation, unspecified: Secondary | ICD-10-CM | POA: Diagnosis not present

## 2014-09-29 DIAGNOSIS — R0602 Shortness of breath: Secondary | ICD-10-CM | POA: Diagnosis not present

## 2014-09-29 DIAGNOSIS — Z9889 Other specified postprocedural states: Secondary | ICD-10-CM | POA: Diagnosis not present

## 2014-09-29 DIAGNOSIS — R109 Unspecified abdominal pain: Secondary | ICD-10-CM | POA: Diagnosis not present

## 2014-09-29 DIAGNOSIS — R51 Headache: Secondary | ICD-10-CM | POA: Diagnosis not present

## 2014-09-29 DIAGNOSIS — Z880 Allergy status to penicillin: Secondary | ICD-10-CM | POA: Insufficient documentation

## 2014-09-29 DIAGNOSIS — R0981 Nasal congestion: Secondary | ICD-10-CM | POA: Insufficient documentation

## 2014-09-29 DIAGNOSIS — Z8639 Personal history of other endocrine, nutritional and metabolic disease: Secondary | ICD-10-CM | POA: Diagnosis not present

## 2014-09-29 DIAGNOSIS — Z9049 Acquired absence of other specified parts of digestive tract: Secondary | ICD-10-CM | POA: Insufficient documentation

## 2014-09-29 DIAGNOSIS — K219 Gastro-esophageal reflux disease without esophagitis: Secondary | ICD-10-CM | POA: Diagnosis not present

## 2014-09-29 DIAGNOSIS — R112 Nausea with vomiting, unspecified: Secondary | ICD-10-CM | POA: Insufficient documentation

## 2014-09-29 DIAGNOSIS — R52 Pain, unspecified: Secondary | ICD-10-CM

## 2014-09-29 DIAGNOSIS — Z8614 Personal history of Methicillin resistant Staphylococcus aureus infection: Secondary | ICD-10-CM | POA: Diagnosis not present

## 2014-09-29 DIAGNOSIS — F328 Other depressive episodes: Secondary | ICD-10-CM | POA: Diagnosis not present

## 2014-09-29 DIAGNOSIS — G4730 Sleep apnea, unspecified: Secondary | ICD-10-CM | POA: Diagnosis not present

## 2014-09-29 DIAGNOSIS — R6883 Chills (without fever): Secondary | ICD-10-CM | POA: Diagnosis not present

## 2014-09-29 DIAGNOSIS — R6884 Jaw pain: Secondary | ICD-10-CM | POA: Diagnosis not present

## 2014-09-29 DIAGNOSIS — G35 Multiple sclerosis: Secondary | ICD-10-CM | POA: Diagnosis not present

## 2014-09-29 DIAGNOSIS — R05 Cough: Secondary | ICD-10-CM | POA: Diagnosis not present

## 2014-09-29 DIAGNOSIS — Z5320 Procedure and treatment not carried out because of patient's decision for unspecified reasons: Secondary | ICD-10-CM | POA: Diagnosis not present

## 2014-09-29 LAB — CBC WITH DIFFERENTIAL/PLATELET
Basophils Absolute: 0 10*3/uL (ref 0.0–0.1)
Basophils Relative: 0 % (ref 0–1)
Eosinophils Absolute: 0.1 10*3/uL (ref 0.0–0.7)
Eosinophils Relative: 1 % (ref 0–5)
HCT: 41.7 % (ref 36.0–46.0)
Hemoglobin: 13.5 g/dL (ref 12.0–15.0)
Lymphocytes Relative: 16 % (ref 12–46)
Lymphs Abs: 1.3 10*3/uL (ref 0.7–4.0)
MCH: 28.2 pg (ref 26.0–34.0)
MCHC: 32.4 g/dL (ref 30.0–36.0)
MCV: 87.2 fL (ref 78.0–100.0)
Monocytes Absolute: 0.6 10*3/uL (ref 0.1–1.0)
Monocytes Relative: 8 % (ref 3–12)
Neutro Abs: 5.7 10*3/uL (ref 1.7–7.7)
Neutrophils Relative %: 75 % (ref 43–77)
Platelets: 219 10*3/uL (ref 150–400)
RBC: 4.78 MIL/uL (ref 3.87–5.11)
RDW: 14.5 % (ref 11.5–15.5)
WBC: 7.7 10*3/uL (ref 4.0–10.5)

## 2014-09-29 LAB — COMPREHENSIVE METABOLIC PANEL
ALT: 46 U/L — ABNORMAL HIGH (ref 0–35)
AST: 33 U/L (ref 0–37)
Albumin: 4.3 g/dL (ref 3.5–5.2)
Alkaline Phosphatase: 96 U/L (ref 39–117)
Anion gap: 6 (ref 5–15)
BUN: 16 mg/dL (ref 6–23)
CO2: 26 mmol/L (ref 19–32)
Calcium: 9 mg/dL (ref 8.4–10.5)
Chloride: 111 mmol/L (ref 96–112)
Creatinine, Ser: 0.54 mg/dL (ref 0.50–1.10)
GFR calc Af Amer: >90 mL/min (ref >90)
GFR calc non Af Amer: >90 mL/min (ref >90)
Glucose, Bld: 112 mg/dL — ABNORMAL HIGH (ref 70–99)
Potassium: 3.7 mmol/L (ref 3.5–5.1)
Sodium: 143 mmol/L (ref 135–145)
Total Bilirubin: 0.2 mg/dL — ABNORMAL LOW (ref 0.3–1.2)
Total Protein: 7.6 g/dL (ref 6.0–8.3)

## 2014-09-29 MED ORDER — IBUPROFEN 400 MG PO TABS
400.0000 mg | ORAL_TABLET | Freq: Once | ORAL | Status: AC
  Administered 2014-09-29: 400 mg via ORAL
  Filled 2014-09-29: qty 1

## 2014-09-29 MED ORDER — ONDANSETRON HCL 4 MG/2ML IJ SOLN
4.0000 mg | Freq: Once | INTRAMUSCULAR | Status: AC
  Administered 2014-09-29: 4 mg via INTRAVENOUS
  Filled 2014-09-29: qty 2

## 2014-09-29 MED ORDER — ALUM & MAG HYDROXIDE-SIMETH 200-200-20 MG/5ML PO SUSP
30.0000 mL | Freq: Once | ORAL | Status: AC
  Administered 2014-09-29: 30 mL via ORAL
  Filled 2014-09-29: qty 30

## 2014-09-29 MED ORDER — ACETAMINOPHEN 500 MG PO TABS
1000.0000 mg | ORAL_TABLET | Freq: Once | ORAL | Status: AC
  Administered 2014-09-29: 1000 mg via ORAL
  Filled 2014-09-29: qty 2

## 2014-09-29 MED ORDER — SODIUM CHLORIDE 0.9 % IV BOLUS (SEPSIS)
1000.0000 mL | Freq: Once | INTRAVENOUS | Status: AC
  Administered 2014-09-29: 1000 mL via INTRAVENOUS

## 2014-09-29 MED ORDER — SODIUM CHLORIDE 0.9 % IV SOLN: Freq: Once | INTRAVENOUS | Status: DC

## 2014-09-29 MED ORDER — IBUPROFEN 100 MG/5ML PO SUSP
ORAL | Status: AC
  Filled 2014-09-29: qty 10

## 2014-09-29 MED ORDER — SODIUM CHLORIDE 0.9 % IV SOLN
Freq: Once | INTRAVENOUS | Status: AC
  Administered 2014-09-29: 1000 mL via INTRAVENOUS
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
## 2014-09-29 NOTE — Discharge Instructions (Signed)
As discussed, it is important that you follow up as soon as possible with your physician for continued management of your condition. ° °If you develop any new, or concerning changes in your condition, please return to the emergency department immediately. ° °

## 2014-09-29 NOTE — ED Notes (Signed)
Pt. Has been seen by Resp. Therapist. Donnie Coffin.

## 2014-09-30 ENCOUNTER — Telehealth: Payer: Self-pay

## 2014-09-30 DIAGNOSIS — F319 Bipolar disorder, unspecified: Secondary | ICD-10-CM | POA: Diagnosis not present

## 2014-09-30 IMAGING — US US ABDOMEN COMPLETE
1 series · 14 of 25 positions shown · non-contrast
Comparison: 04/11/2013

CLINICAL DATA: Right upper quadrant pain, history of cholelithiasis

EXAM:
ULTRASOUND ABDOMEN COMPLETE

[Series 1: us abdomen complete · 0.37mm/px · 14 of 68 slices shown]
[im 1/68]
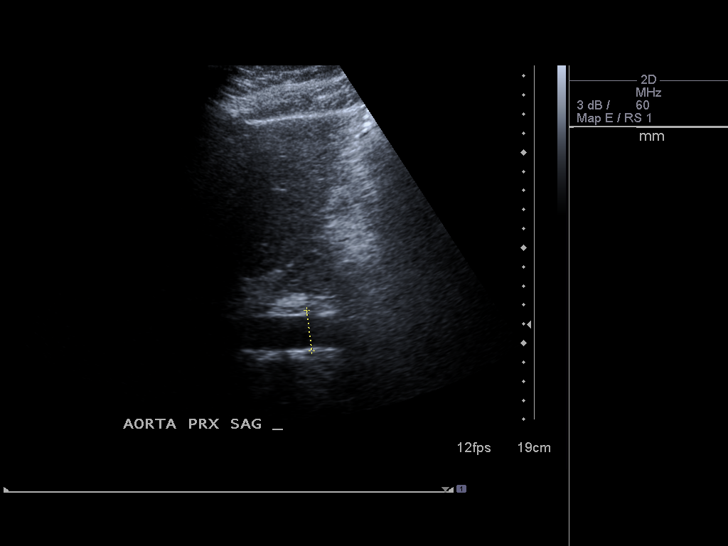
[im 6/68]
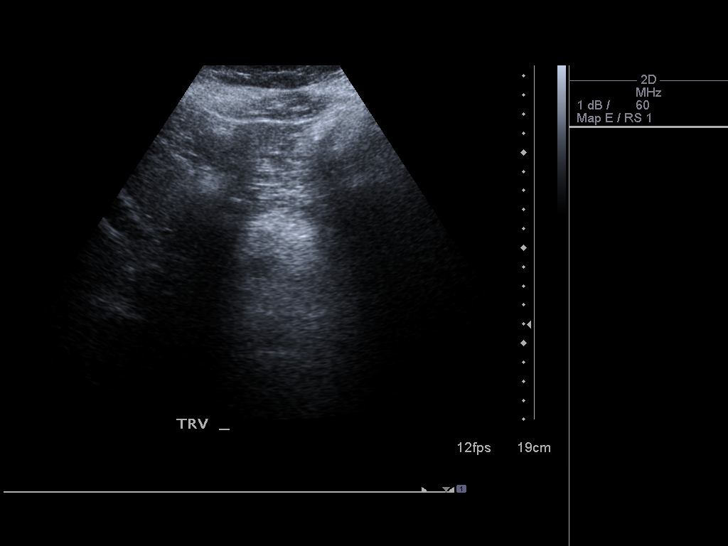
[im 12/68]
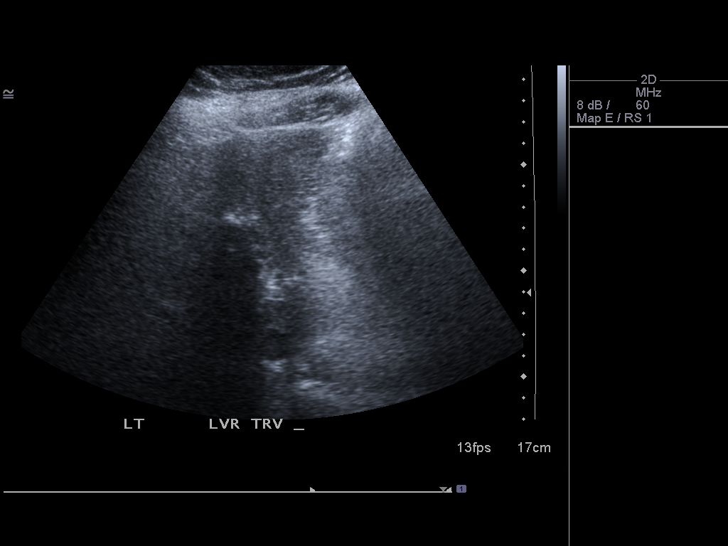
[im 17/68]
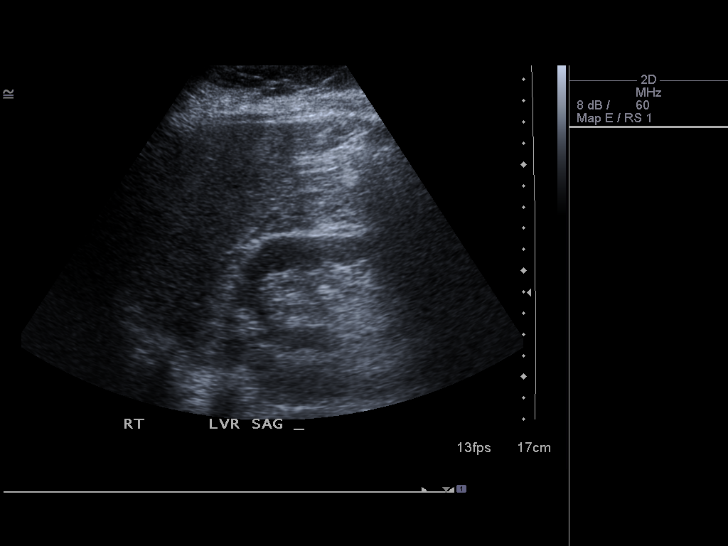
[im 23/68]
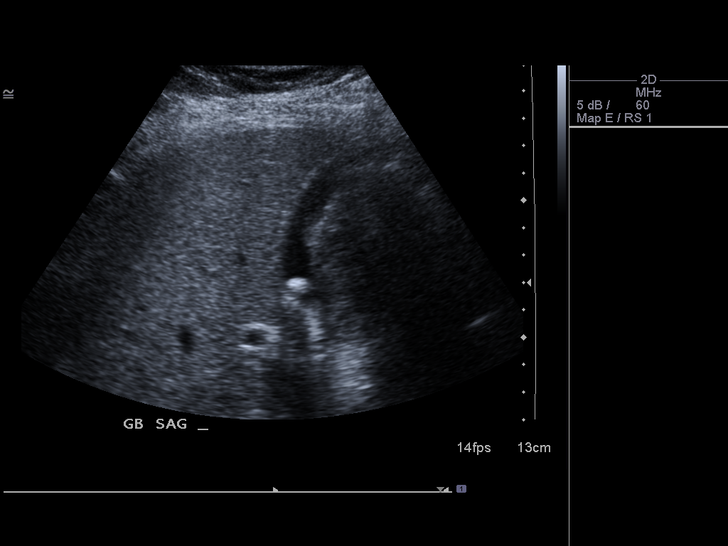
[im 26/68]
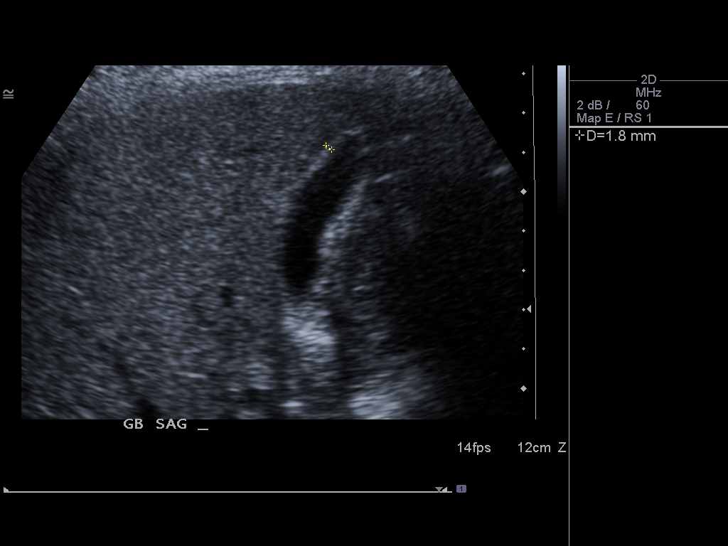
[im 31/68]
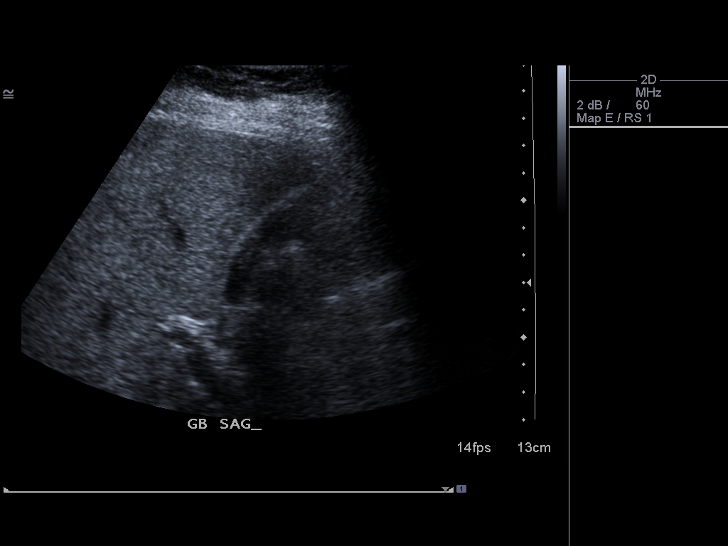
[im 37/68]
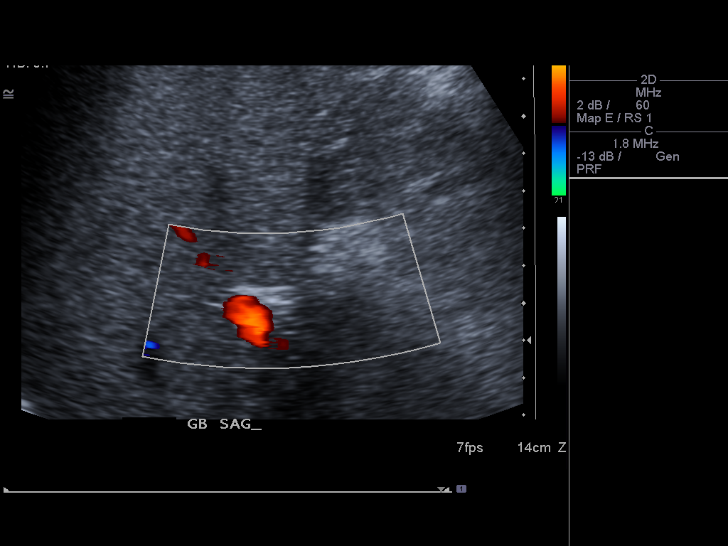
[im 42/68]
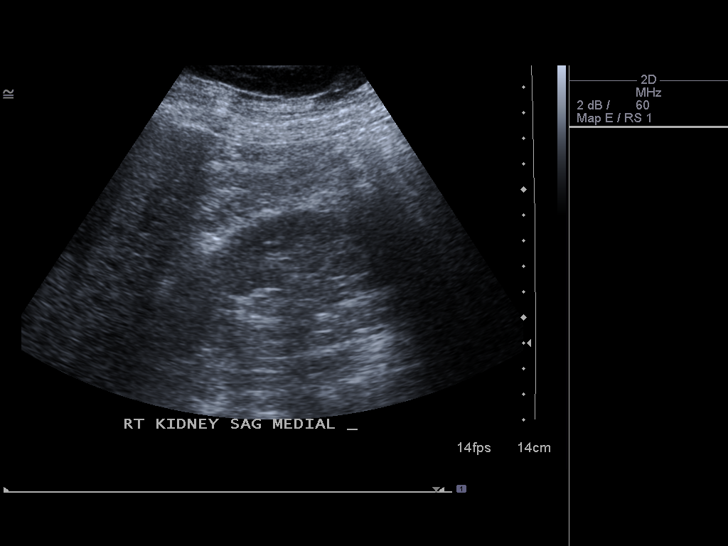
[im 45/68]
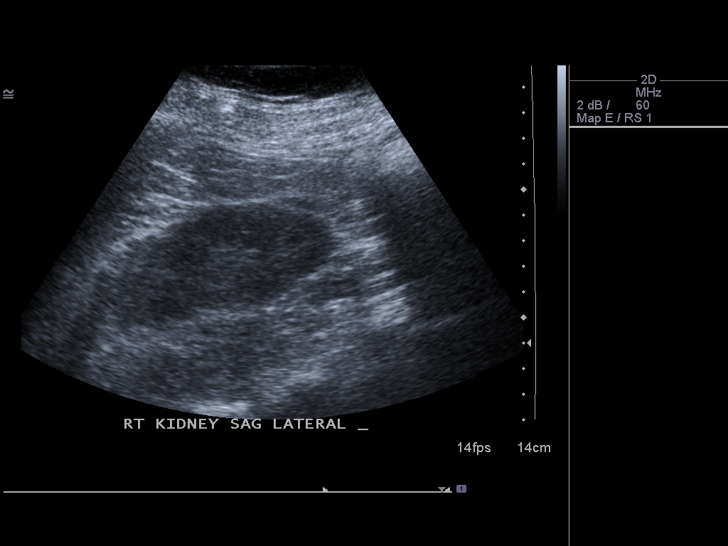
[im 51/68]
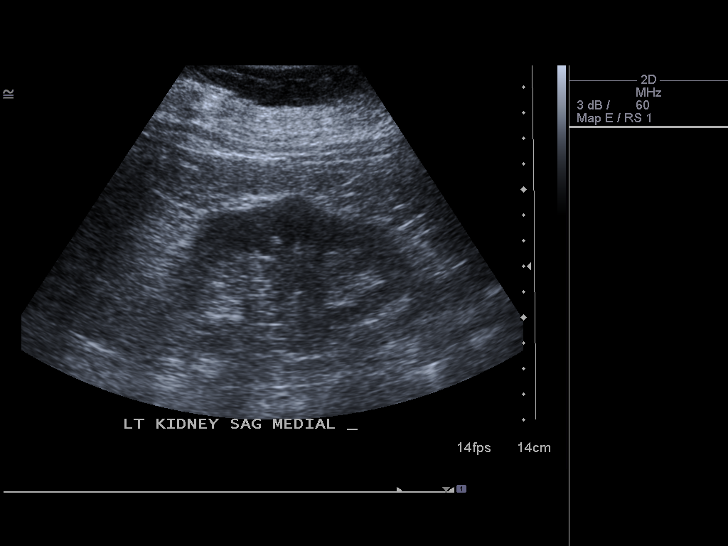
[im 56/68]
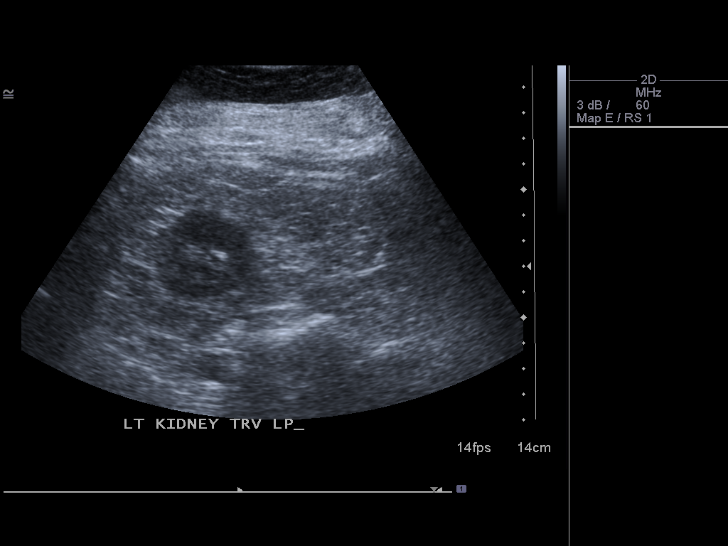
[im 62/68]
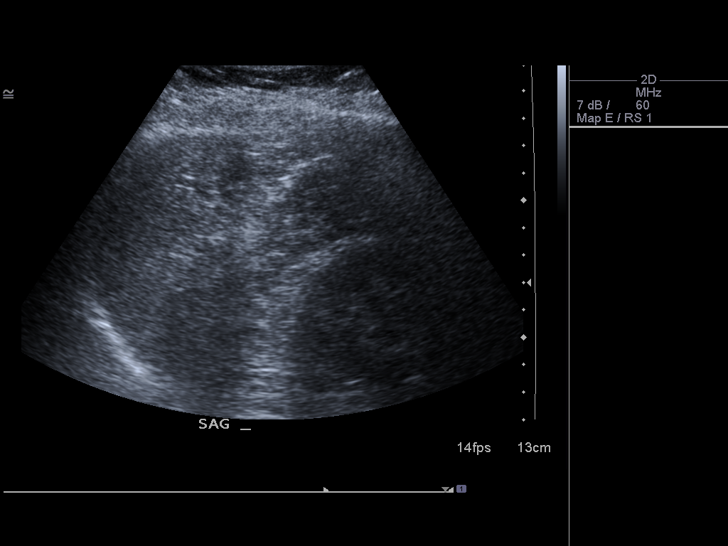
[im 68/68]
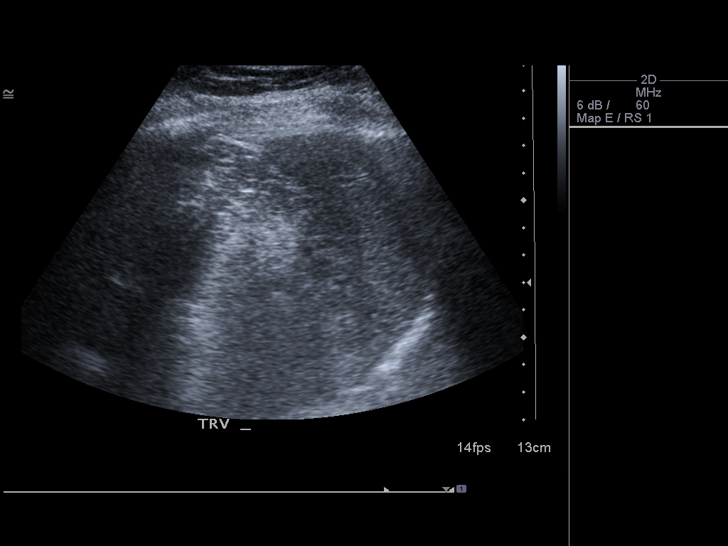

[14 of 25 positions shown; findings below may reference images not displayed]

FINDINGS: Gallbladder

Well distended with single mobile gallstone identified. No
gallbladder wall thickening is noted.

Common bile duct

Diameter: 3 mm.

Liver

No focal lesion identified. Within normal limits in parenchymal
echogenicity.

IVC

No abnormality visualized.

Pancreas

Visualized portion unremarkable.

Spleen

Size and appearance within normal limits.

Right Kidney

Length: 12.4 cm.. Echogenicity within normal limits. No mass or
hydronephrosis visualized.

Left Kidney

Length: 11.3 cm.. Echogenicity within normal limits. No mass or
hydronephrosis visualized.

Abdominal aorta

No aneurysm visualized.
IMPRESSION: Stable cholelithiasis.  No acute abnormality is noted.

## 2014-09-30 IMAGING — CR DG ABDOMEN ACUTE W/ 1V CHEST
3 series · 3 of 3 positions shown · non-contrast
Comparison: 07/03/2013

CLINICAL DATA: Abdominal pain, nausea, constipation

EXAM:
ACUTE ABDOMEN SERIES (ABDOMEN 2 VIEW & CHEST 1 VIEW)

[w chest pa]
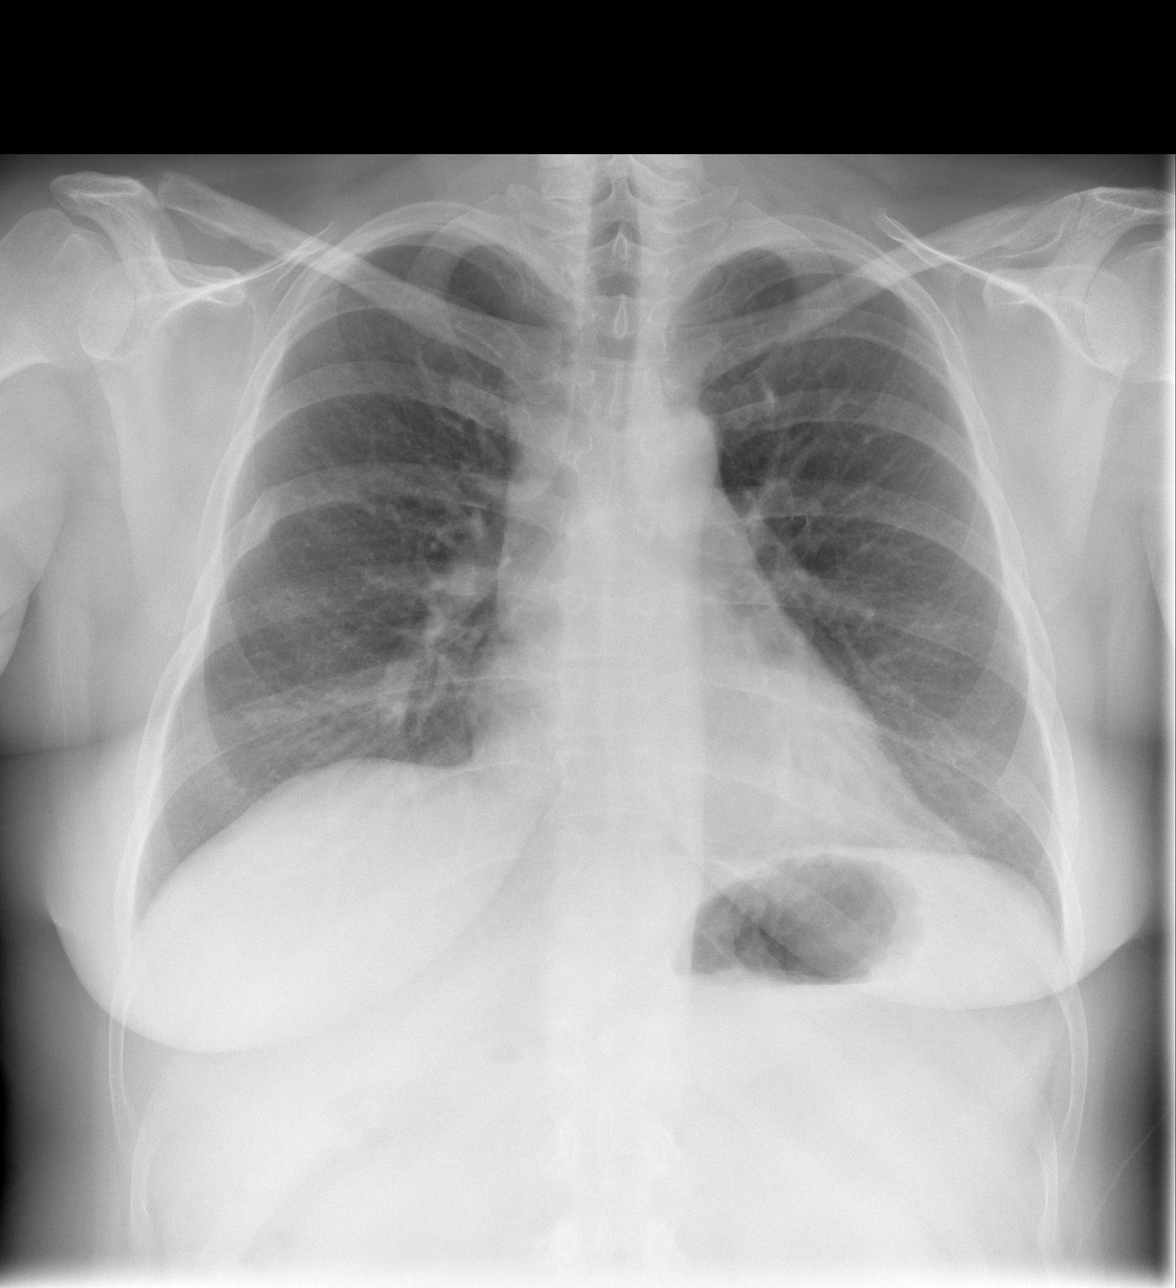

[w abdomen upright]
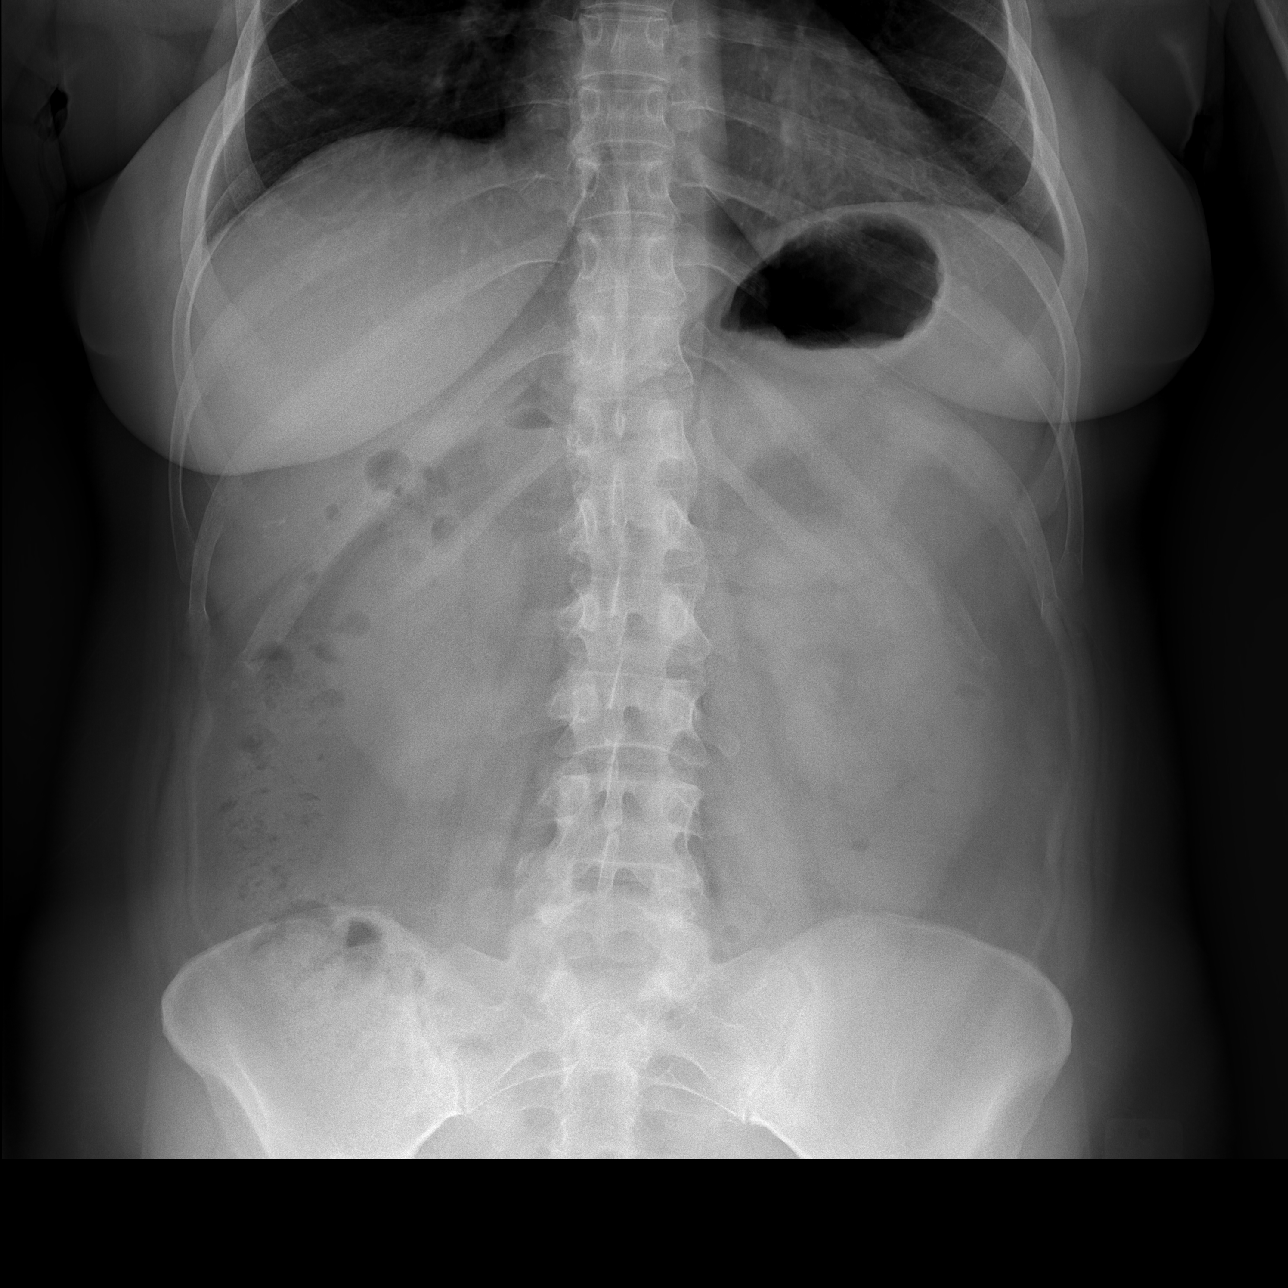

[t abdomen supine]
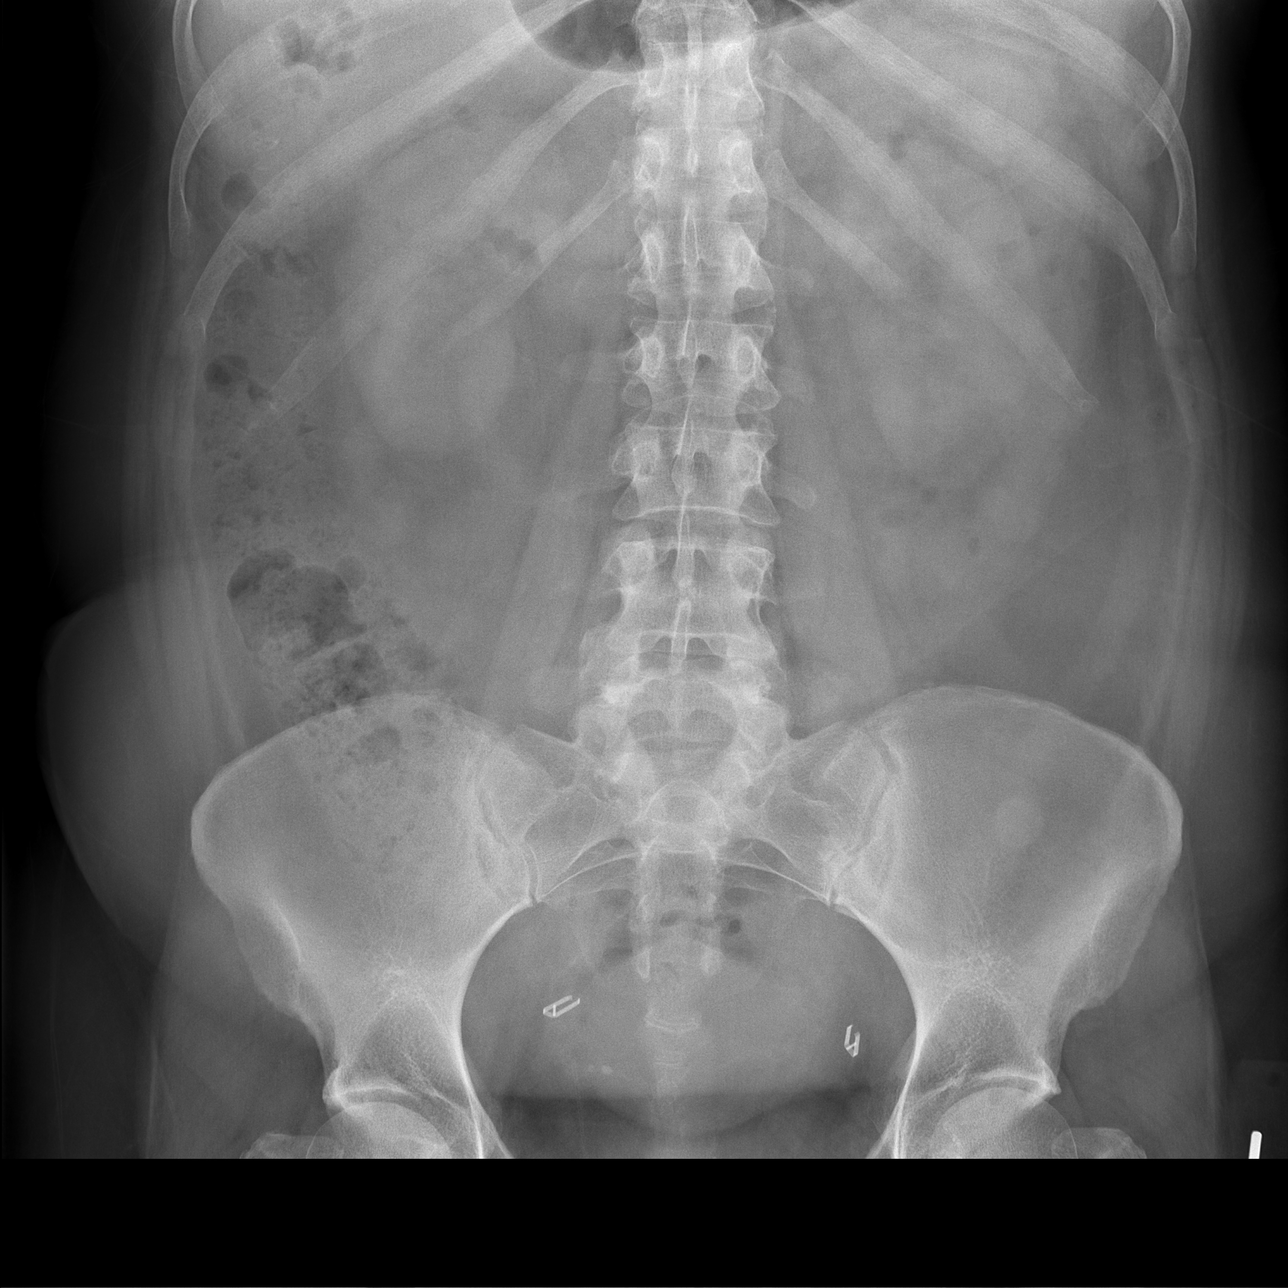

[3 of 3 positions shown; findings below may reference images not displayed]

FINDINGS: Cardiomediastinal silhouette is stable. No acute infiltrate or
pulmonary edema. Old right rib fracture again noted. There is
nonspecific nonobstructive bowel gas pattern. Post tubal ligation
surgical clips are noted. No free abdominal air.
IMPRESSION: Negative abdominal radiographs.  No acute cardiopulmonary disease.

## 2014-09-30 NOTE — Telephone Encounter (Signed)
Jennifer [name] called this morning for a work-in appointment. There were no available slots. I called patient to schedule her for another day. No answer, left message for return call.

## 2014-10-05 ENCOUNTER — Encounter: Payer: Self-pay | Admitting: Family Medicine

## 2014-10-05 DIAGNOSIS — R7309 Other abnormal glucose: Secondary | ICD-10-CM | POA: Diagnosis not present

## 2014-10-05 DIAGNOSIS — J45909 Unspecified asthma, uncomplicated: Secondary | ICD-10-CM | POA: Diagnosis not present

## 2014-10-05 DIAGNOSIS — Z79899 Other long term (current) drug therapy: Secondary | ICD-10-CM | POA: Diagnosis not present

## 2014-10-05 DIAGNOSIS — E162 Hypoglycemia, unspecified: Secondary | ICD-10-CM | POA: Diagnosis not present

## 2014-10-05 DIAGNOSIS — E259 Adrenogenital disorder, unspecified: Secondary | ICD-10-CM | POA: Diagnosis not present

## 2014-10-05 DIAGNOSIS — Z8673 Personal history of transient ischemic attack (TIA), and cerebral infarction without residual deficits: Secondary | ICD-10-CM | POA: Diagnosis not present

## 2014-10-05 DIAGNOSIS — G894 Chronic pain syndrome: Secondary | ICD-10-CM | POA: Diagnosis not present

## 2014-10-05 DIAGNOSIS — M545 Low back pain: Secondary | ICD-10-CM | POA: Diagnosis not present

## 2014-10-05 DIAGNOSIS — Z8542 Personal history of malignant neoplasm of other parts of uterus: Secondary | ICD-10-CM | POA: Diagnosis not present

## 2014-10-05 DIAGNOSIS — G35 Multiple sclerosis: Secondary | ICD-10-CM | POA: Diagnosis not present

## 2014-10-05 DIAGNOSIS — I471 Supraventricular tachycardia: Secondary | ICD-10-CM | POA: Diagnosis not present

## 2014-10-05 DIAGNOSIS — K648 Other hemorrhoids: Secondary | ICD-10-CM | POA: Diagnosis not present

## 2014-10-05 DIAGNOSIS — K219 Gastro-esophageal reflux disease without esophagitis: Secondary | ICD-10-CM | POA: Diagnosis not present

## 2014-10-05 DIAGNOSIS — K2270 Barrett's esophagus without dysplasia: Secondary | ICD-10-CM | POA: Diagnosis not present

## 2014-10-05 DIAGNOSIS — I10 Essential (primary) hypertension: Secondary | ICD-10-CM | POA: Diagnosis not present

## 2014-10-05 DIAGNOSIS — K5790 Diverticulosis of intestine, part unspecified, without perforation or abscess without bleeding: Secondary | ICD-10-CM | POA: Diagnosis not present

## 2014-10-05 DIAGNOSIS — E876 Hypokalemia: Secondary | ICD-10-CM | POA: Diagnosis not present

## 2014-10-05 DIAGNOSIS — E2681 Bartter's syndrome: Secondary | ICD-10-CM | POA: Diagnosis not present

## 2014-10-05 DIAGNOSIS — K760 Fatty (change of) liver, not elsewhere classified: Secondary | ICD-10-CM | POA: Diagnosis not present

## 2014-10-05 DIAGNOSIS — G4730 Sleep apnea, unspecified: Secondary | ICD-10-CM | POA: Diagnosis not present

## 2014-10-05 DIAGNOSIS — R569 Unspecified convulsions: Secondary | ICD-10-CM | POA: Diagnosis not present

## 2014-10-05 DIAGNOSIS — D649 Anemia, unspecified: Secondary | ICD-10-CM | POA: Diagnosis not present

## 2014-10-05 DIAGNOSIS — N816 Rectocele: Secondary | ICD-10-CM | POA: Diagnosis not present

## 2014-10-05 DIAGNOSIS — G43909 Migraine, unspecified, not intractable, without status migrainosus: Secondary | ICD-10-CM | POA: Diagnosis not present

## 2014-10-05 DIAGNOSIS — F450 Somatization disorder: Secondary | ICD-10-CM | POA: Diagnosis not present

## 2014-10-05 LAB — CMP14+LP+1AC+CBC/D/PLT+TSH
Calcium: 8.8 mg/dL
Chloride: 106 mmol/L

## 2014-10-05 LAB — BASIC METABOLIC PANEL
BUN: 12 mg/dL (ref 4–21)
Creatinine: 0.6 mg/dL (ref 0.5–1.1)
Glucose: 105 mg/dL
Potassium: 3.9 mmol/L (ref 3.4–5.3)
Sodium: 143 mmol/L (ref 137–147)

## 2014-10-06 DIAGNOSIS — I10 Essential (primary) hypertension: Secondary | ICD-10-CM | POA: Diagnosis not present

## 2014-10-06 DIAGNOSIS — R000 Tachycardia, unspecified: Secondary | ICD-10-CM | POA: Diagnosis not present

## 2014-10-07 ENCOUNTER — Telehealth: Payer: Self-pay | Admitting: Family Medicine

## 2014-10-07 NOTE — Telephone Encounter (Signed)
Please call pt: potassium looks great.

## 2014-10-07 NOTE — Telephone Encounter (Signed)
Patient called today wanting a follow up hospital appointment. There is no available appointments today. She is having jaw pain and is concerned about what the cause maybe. Patient asked if she could be seen in the Urgent Care. I called over to Urgent Care and Dr Assunta Found suggested she see a dentist. Patient advised.

## 2014-10-08 ENCOUNTER — Telehealth: Payer: Self-pay | Admitting: Family Medicine

## 2014-10-08 NOTE — Telephone Encounter (Signed)
Patient called and states that she was suppose to have a referral entered for her to go to PT at [facility]

## 2014-10-10 ENCOUNTER — Other Ambulatory Visit: Payer: Self-pay | Admitting: *Deleted

## 2014-10-10 DIAGNOSIS — M25551 Pain in right hip: Secondary | ICD-10-CM

## 2014-10-10 IMAGING — CT CT ANGIO CHEST
1 of 2 series · 19 of 32 positions shown · IV contrast (OMNIPAQUE 300)
Comparison: Chest x-ray accompanying and abdominal series dated
July 07, 2013.

CLINICAL DATA: Dyspnea, elevated D-dimer, history of uterine
malignancy.

EXAM:
CT ANGIOGRAPHY CHEST WITH CONTRAST
TECHNIQUE: Multidetector CT imaging of the chest was performed using the
standard protocol during bolus administration of intravenous
contrast. Multiplanar CT image reconstructions including MIPs were
obtained to evaluate the vascular anatomy.
CONTRAST:  100mL OMNIPAQUE IOHEXOL 350 MG/ML SOLN

[Series 6: thins for pacs · axial · 0.62mm/px · z∈[-201,-10]mm · 19 of 213 slices shown]
[im 11/213  lung]
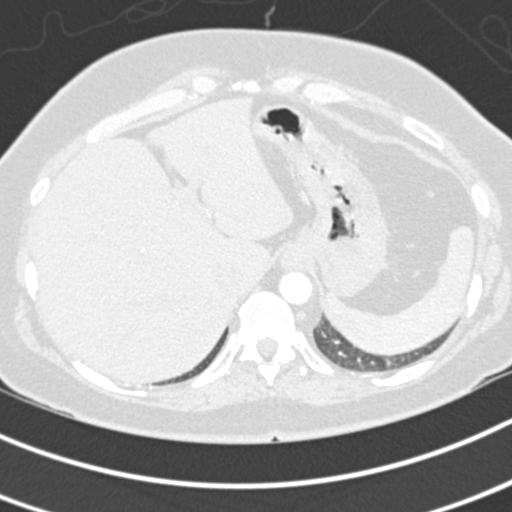
[im 22/213  mediastinal]
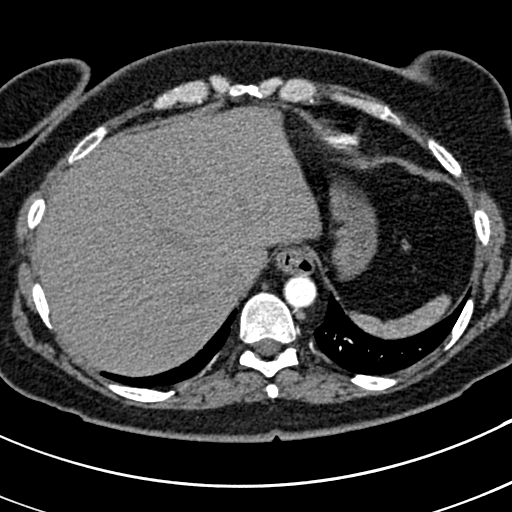
[im 32/213  lung]
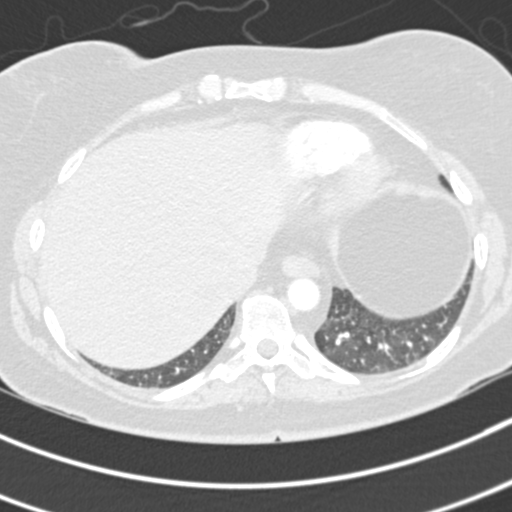
[im 54/213  mediastinal]
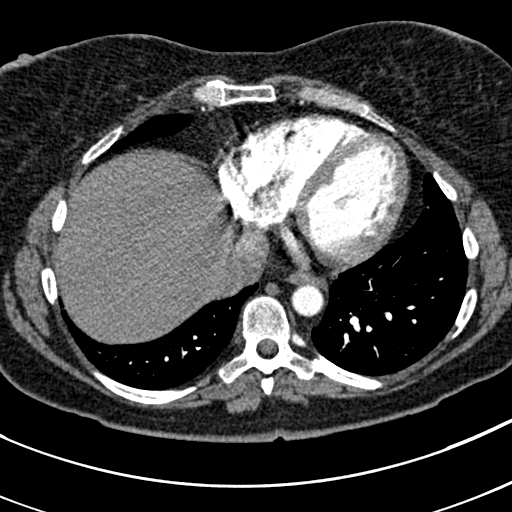
[im 64/213  lung]
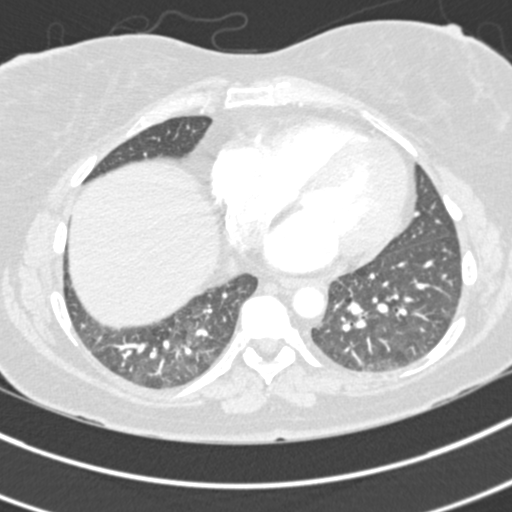
[im 71/213  mediastinal]
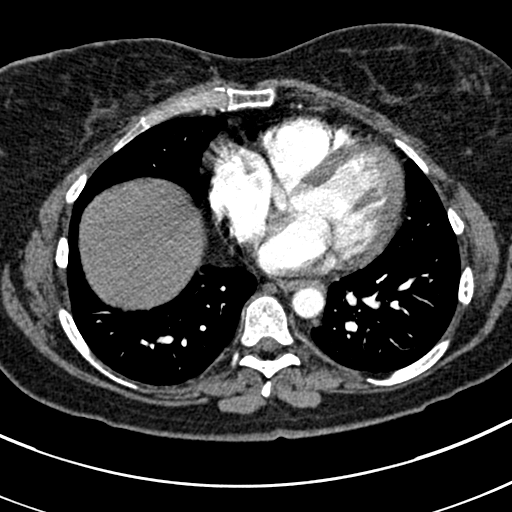
[im 75/213  lung]
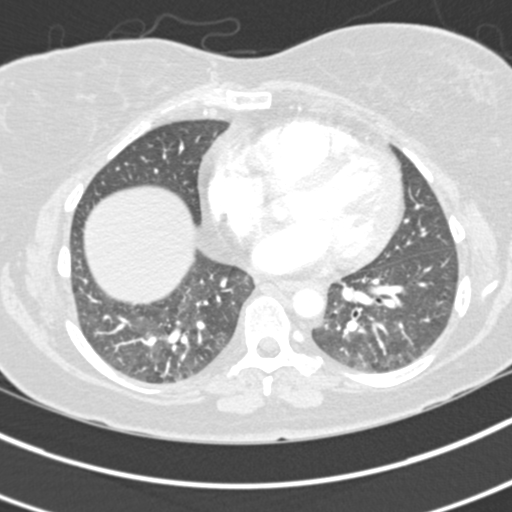
[im 85/213  mediastinal]
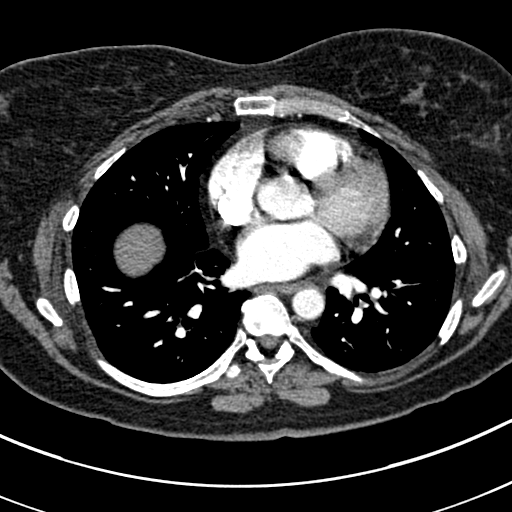
[im 96/213  lung]
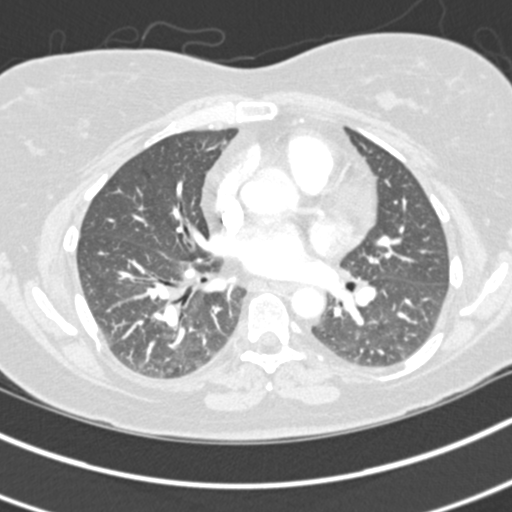
[im 107/213  mediastinal]
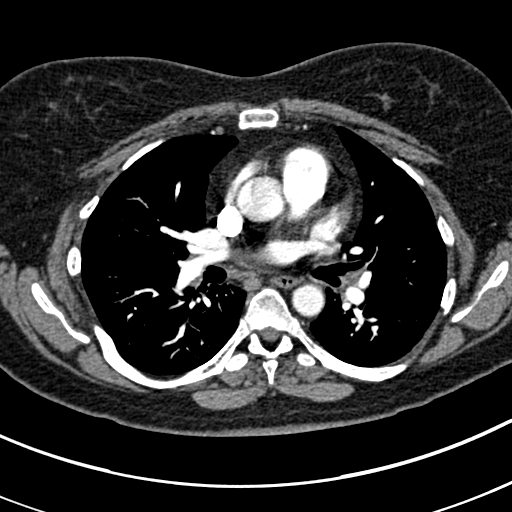
[im 117/213  lung]
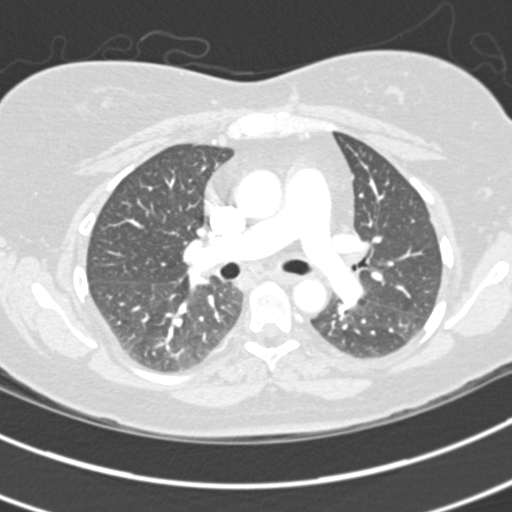
[im 128/213  mediastinal]
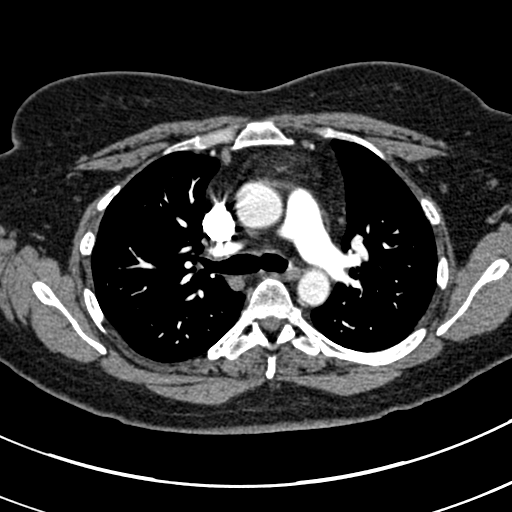
[im 138/213  lung]
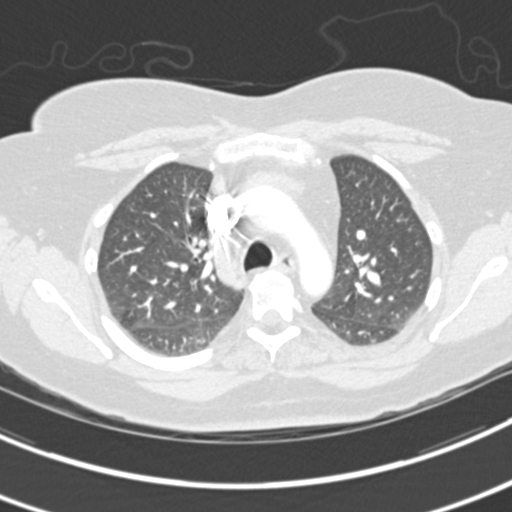
[im 142/213  mediastinal]
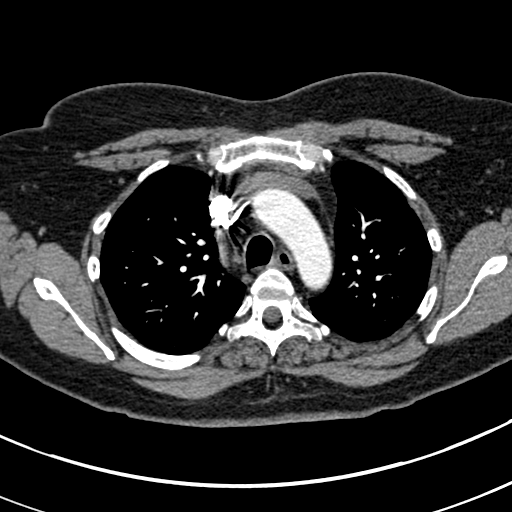
[im 149/213  lung]
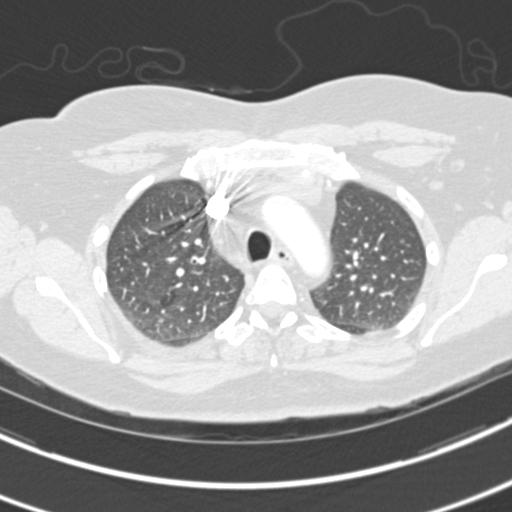
[im 160/213  mediastinal]
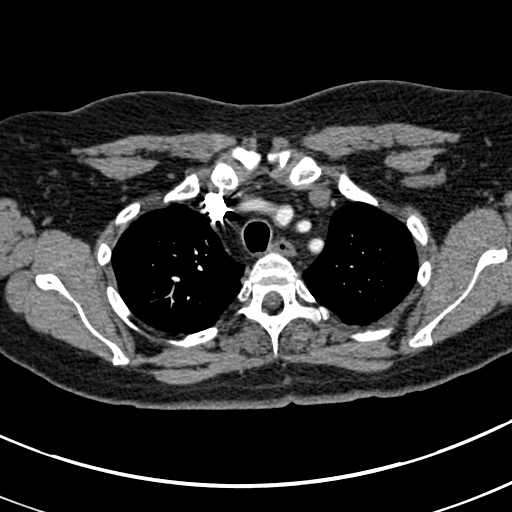
[im 181/213  lung]
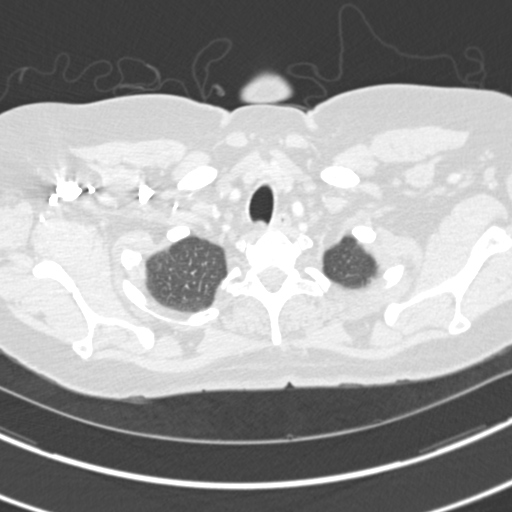
[im 191/213  mediastinal]
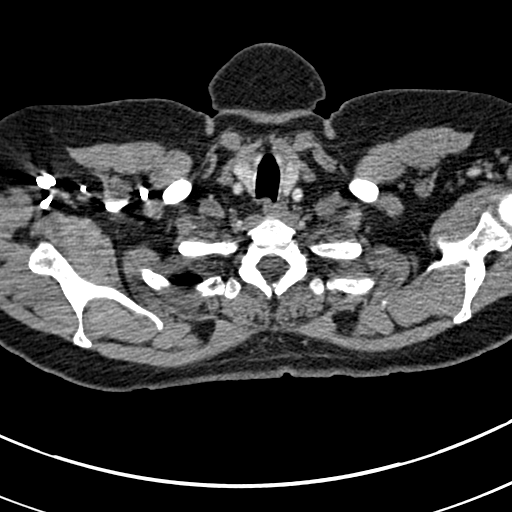
[im 202/213  lung]
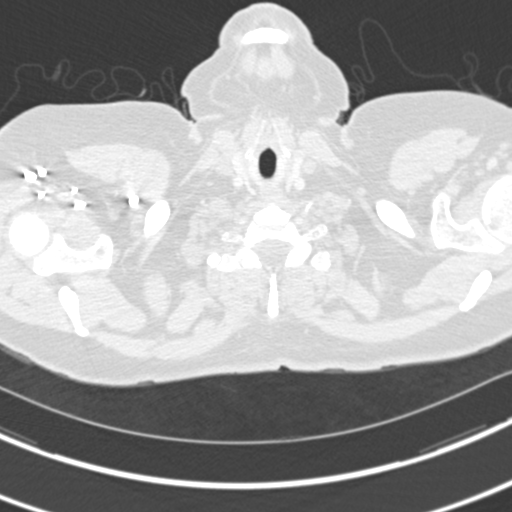

[19 of 32 positions shown; findings below may reference images not displayed]

FINDINGS: Contrast within the central and peripheral pulmonary arterial tree
is normal. There are no filling defects to suggest an acute
pulmonary embolism. The caliber of the thoracic aorta is normal.
There is no evidence of a false lumen. The cardiac chambers are
top-normal in size. There is no pleural nor pericardial effusion.
There is a small hiatal hernia. There is no mediastinal nor hilar
lymphadenopathy. The observed portions of the thyroid gland appear
normal.

At lung window settings there is no interstitial or alveolar
infiltrate. No pulmonary parenchymal nodules or masses are
demonstrated. There is no pneumothorax or pneumomediastinum.

Within the upper abdomen the observed portions of the liver and
spleen appear normal. The thoracic vertebral bodies are preserved in
height.
IMPRESSION: 1. There is no evidence of an acute pulmonary embolism. No acute
thoracic aortic pathology is demonstrated. Review of the MIP images
confirms the above findings.
2. There is a top-normal cardiac chamber size without evidence of
pulmonary interstitial edema.
3. There is no evidence of pneumonia nor other acute pulmonary
parenchymal abnormality. There are no findings suspicious for
metastatic uterine malignancy nor other new metastatic disease.

## 2014-10-11 DIAGNOSIS — M545 Low back pain: Secondary | ICD-10-CM | POA: Diagnosis not present

## 2014-10-11 DIAGNOSIS — R109 Unspecified abdominal pain: Secondary | ICD-10-CM | POA: Diagnosis not present

## 2014-10-12 ENCOUNTER — Telehealth: Payer: Self-pay | Admitting: Family Medicine

## 2014-10-12 ENCOUNTER — Emergency Department (HOSPITAL_BASED_OUTPATIENT_CLINIC_OR_DEPARTMENT_OTHER)
Admission: EM | Admit: 2014-10-12 | Discharge: 2014-10-12 | Disposition: A | Discharge status: Home / Self Care | Payer: Medicare Other | Attending: Emergency Medicine | Admitting: Emergency Medicine

## 2014-10-12 ENCOUNTER — Encounter (HOSPITAL_BASED_OUTPATIENT_CLINIC_OR_DEPARTMENT_OTHER): Payer: Self-pay | Admitting: *Deleted

## 2014-10-12 DIAGNOSIS — F459 Somatoform disorder, unspecified: Secondary | ICD-10-CM | POA: Diagnosis not present

## 2014-10-12 DIAGNOSIS — Z79899 Other long term (current) drug therapy: Secondary | ICD-10-CM | POA: Insufficient documentation

## 2014-10-12 DIAGNOSIS — G43909 Migraine, unspecified, not intractable, without status migrainosus: Secondary | ICD-10-CM | POA: Diagnosis not present

## 2014-10-12 DIAGNOSIS — M545 Low back pain, unspecified: Secondary | ICD-10-CM

## 2014-10-12 DIAGNOSIS — G8929 Other chronic pain: Secondary | ICD-10-CM | POA: Insufficient documentation

## 2014-10-12 DIAGNOSIS — R112 Nausea with vomiting, unspecified: Secondary | ICD-10-CM | POA: Diagnosis not present

## 2014-10-12 DIAGNOSIS — Z87891 Personal history of nicotine dependence: Secondary | ICD-10-CM | POA: Insufficient documentation

## 2014-10-12 DIAGNOSIS — Z880 Allergy status to penicillin: Secondary | ICD-10-CM | POA: Insufficient documentation

## 2014-10-12 DIAGNOSIS — I471 Supraventricular tachycardia: Secondary | ICD-10-CM | POA: Diagnosis not present

## 2014-10-12 DIAGNOSIS — K644 Residual hemorrhoidal skin tags: Secondary | ICD-10-CM | POA: Diagnosis not present

## 2014-10-12 DIAGNOSIS — K5790 Diverticulosis of intestine, part unspecified, without perforation or abscess without bleeding: Secondary | ICD-10-CM | POA: Diagnosis not present

## 2014-10-12 DIAGNOSIS — R300 Dysuria: Secondary | ICD-10-CM | POA: Diagnosis not present

## 2014-10-12 DIAGNOSIS — Z8541 Personal history of malignant neoplasm of cervix uteri: Secondary | ICD-10-CM | POA: Diagnosis not present

## 2014-10-12 DIAGNOSIS — Z9889 Other specified postprocedural states: Secondary | ICD-10-CM | POA: Diagnosis not present

## 2014-10-12 DIAGNOSIS — G4730 Sleep apnea, unspecified: Secondary | ICD-10-CM | POA: Diagnosis not present

## 2014-10-12 DIAGNOSIS — K2270 Barrett's esophagus without dysplasia: Secondary | ICD-10-CM | POA: Diagnosis not present

## 2014-10-12 DIAGNOSIS — J45909 Unspecified asthma, uncomplicated: Secondary | ICD-10-CM | POA: Diagnosis not present

## 2014-10-12 DIAGNOSIS — Z8542 Personal history of malignant neoplasm of other parts of uterus: Secondary | ICD-10-CM | POA: Diagnosis not present

## 2014-10-12 DIAGNOSIS — E162 Hypoglycemia, unspecified: Secondary | ICD-10-CM | POA: Diagnosis not present

## 2014-10-12 DIAGNOSIS — Z8669 Personal history of other diseases of the nervous system and sense organs: Secondary | ICD-10-CM | POA: Insufficient documentation

## 2014-10-12 DIAGNOSIS — Z8614 Personal history of Methicillin resistant Staphylococcus aureus infection: Secondary | ICD-10-CM | POA: Diagnosis not present

## 2014-10-12 DIAGNOSIS — M5441 Lumbago with sciatica, right side: Secondary | ICD-10-CM | POA: Insufficient documentation

## 2014-10-12 DIAGNOSIS — Z8673 Personal history of transient ischemic attack (TIA), and cerebral infarction without residual deficits: Secondary | ICD-10-CM | POA: Diagnosis not present

## 2014-10-12 DIAGNOSIS — K5900 Constipation, unspecified: Secondary | ICD-10-CM | POA: Diagnosis not present

## 2014-10-12 DIAGNOSIS — E269 Hyperaldosteronism, unspecified: Secondary | ICD-10-CM | POA: Diagnosis not present

## 2014-10-12 DIAGNOSIS — K219 Gastro-esophageal reflux disease without esophagitis: Secondary | ICD-10-CM | POA: Insufficient documentation

## 2014-10-12 DIAGNOSIS — I10 Essential (primary) hypertension: Secondary | ICD-10-CM | POA: Diagnosis not present

## 2014-10-12 DIAGNOSIS — K648 Other hemorrhoids: Secondary | ICD-10-CM | POA: Diagnosis not present

## 2014-10-12 DIAGNOSIS — Z9981 Dependence on supplemental oxygen: Secondary | ICD-10-CM | POA: Diagnosis not present

## 2014-10-12 DIAGNOSIS — E785 Hyperlipidemia, unspecified: Secondary | ICD-10-CM | POA: Diagnosis not present

## 2014-10-12 DIAGNOSIS — Z87442 Personal history of urinary calculi: Secondary | ICD-10-CM | POA: Diagnosis not present

## 2014-10-12 DIAGNOSIS — D649 Anemia, unspecified: Secondary | ICD-10-CM | POA: Diagnosis not present

## 2014-10-12 DIAGNOSIS — G35 Multiple sclerosis: Secondary | ICD-10-CM | POA: Diagnosis not present

## 2014-10-12 DIAGNOSIS — Z862 Personal history of diseases of the blood and blood-forming organs and certain disorders involving the immune mechanism: Secondary | ICD-10-CM | POA: Diagnosis not present

## 2014-10-12 DIAGNOSIS — G894 Chronic pain syndrome: Secondary | ICD-10-CM | POA: Diagnosis not present

## 2014-10-12 DIAGNOSIS — R569 Unspecified convulsions: Secondary | ICD-10-CM | POA: Diagnosis not present

## 2014-10-12 DIAGNOSIS — R109 Unspecified abdominal pain: Secondary | ICD-10-CM | POA: Diagnosis not present

## 2014-10-12 DIAGNOSIS — R3911 Hesitancy of micturition: Secondary | ICD-10-CM | POA: Diagnosis not present
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
## 2014-10-12 NOTE — Telephone Encounter (Signed)
Patient called in tears and pain wanting to get in BEFORE her Thursday appointment Rectal/Vagina pain and would like to be worked in and would like to talk to [place] to see what to do?

## 2014-10-12 NOTE — Discharge Instructions (Signed)

## 2014-10-12 NOTE — ED Notes (Signed)
Pt c/o lower back pain x 1 week, seen at [place] ED last PM for same

## 2014-10-14 NOTE — Telephone Encounter (Signed)
Referral placed.

## 2014-10-14 NOTE — Telephone Encounter (Signed)
Patient will be seen tomorrow.

## 2014-10-15 ENCOUNTER — Encounter: Payer: Self-pay | Admitting: Family Medicine

## 2014-10-15 ENCOUNTER — Ambulatory Visit (INDEPENDENT_AMBULATORY_CARE_PROVIDER_SITE_OTHER): Payer: Medicare Other | Admitting: Family Medicine

## 2014-10-15 VITALS — BP 138/82 | HR 90 | Wt 198.0 lb

## 2014-10-15 DIAGNOSIS — M25551 Pain in right hip: Secondary | ICD-10-CM | POA: Diagnosis not present

## 2014-10-15 DIAGNOSIS — M25512 Pain in left shoulder: Secondary | ICD-10-CM

## 2014-10-15 DIAGNOSIS — R220 Localized swelling, mass and lump, head: Secondary | ICD-10-CM

## 2014-10-15 DIAGNOSIS — Z6836 Body mass index (BMI) 36.0-36.9, adult: Secondary | ICD-10-CM | POA: Diagnosis not present

## 2014-10-15 DIAGNOSIS — M549 Dorsalgia, unspecified: Secondary | ICD-10-CM

## 2014-10-15 DIAGNOSIS — M5442 Lumbago with sciatica, left side: Secondary | ICD-10-CM

## 2014-10-15 DIAGNOSIS — E669 Obesity, unspecified: Secondary | ICD-10-CM

## 2014-10-15 DIAGNOSIS — M25511 Pain in right shoulder: Secondary | ICD-10-CM | POA: Diagnosis not present

## 2014-10-15 DIAGNOSIS — M25552 Pain in left hip: Secondary | ICD-10-CM | POA: Diagnosis not present

## 2014-10-15 NOTE — Telephone Encounter (Signed)
Pt to be informed at ov .Jennifer [name]
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
## 2014-10-15 NOTE — Patient Instructions (Signed)
Use My [employer]itness [employer].  Start with walking with the neighbor  Avoid milk products   Liraglutide injection (Weight Management) What is this medicine? LIRAGLUTIDE (LIR a GLOO tide) is used with a reduced calorie diet and exercise to help you lose weight. This medicine may be used for other purposes; ask your health care provider or pharmacist if you have questions. COMMON BRAND NAME(S): Saxenda What should I tell my health care provider before I take this medicine? They need to know if you have any of these conditions: -endocrine tumors (MEN 2) or if someone in your family had these tumors -gallstones -high cholesterol -history of alcohol abuse problem -history of pancreatitis -kidney disease or if you are on dialysis -liver disease -previous swelling of the tongue, face, or lips with difficulty breathing, difficulty swallowing, hoarseness, or tightening of the throat -stomach problems -suicidal thoughts, plans, or attempt; a previous suicide attempt by you or a family member -thyroid cancer or if someone in your family had thyroid cancer -an unusual or allergic reaction to liraglutide, medicines, foods, dyes, or preservatives -pregnant or trying to get pregnant -breast-feeding How should I use this medicine? This medicine is for injection under the skin of your upper leg, stomach area, or upper arm. You will be taught how to prepare and give this medicine. Use exactly as directed. Take your medicine at regular intervals. Do not take it more often than directed. It is important that you put your used needles and syringes in a special sharps container. Do not put them in a trash can. If you do not have a sharps container, call your pharmacist or healthcare provider to get one. A special MedGuide will be given to you by the pharmacist with each prescription and refill. Be sure to read this information carefully each time. Talk to your pediatrician regarding the use of this medicine in  children. Special care may be needed. Overdosage: If you think you've taken too much of this medicine contact a poison control center or emergency room at once. Overdosage: If you think you have taken too much of this medicine contact a poison control center or emergency room at once. NOTE: This medicine is only for you. Do not share this medicine with others. What if I miss a dose? If you miss a dose, take it as soon as you can. If it is almost time for your next dose, take only that dose. Do not take double or extra doses. If you miss your dose for 3 days or more, call your doctor or health care professional to talk about how to restart this medicine. What may interact with this medicine? -acetaminophen -atorvastatin -birth control pills -digoxin -griseofulvin -lisinopril This list may not describe all possible interactions. Give your health care provider a list of all the medicines, herbs, non-prescription drugs, or dietary supplements you use. Also tell them if you smoke, drink alcohol, or use illegal drugs. Some items may interact with your medicine. What should I watch for while using this medicine? Visit your doctor or health care professional for regular checks on your progress. This medicine is intended to be used in addition to a healthy diet and appropriate exercise. The best results are achieved this way. Do not increase or in any way change your dose without consulting your doctor or health care professional. This medicine may affect blood sugar levels. If you have diabetes, check with your doctor or health care professional before you change your diet or the dose of your diabetic  medicine. Patients and their families should watch out for worsening depression or thoughts of suicide. Also watch out for sudden changes in feelings such as feeling anxious, agitated, panicky, irritable, hostile, aggressive, impulsive, severely restless, overly excited and hyperactive, or not being able to  sleep. If this happens, especially at the beginning of treatment or after a change in dose, call your health care professional. What side effects may I notice from receiving this medicine? Side effects that you should report to your doctor or health care professional as soon as possible: -allergic reactions like skin rash, itching or hives, swelling of the face, lips, or tongue -breathing problems -fever, chills -loss of appetite -signs and symptoms of low blood sugar such as feeling anxious, confusion, dizziness, increased hunger, unusually weak or tired, sweating, shakiness, cold, irritable, headache, blurred vision, fast heartbeat, loss of consciousness -trouble passing urine or change in the amount of urine -unusual stomach pain or upset -vomiting Side effects that usually do not require medical attention (Report these to your doctor or health care professional if they continue or are bothersome.): -constipation -diarrhea -fatigue -headache -nausea This list may not describe all possible side effects. Call your doctor for medical advice about side effects. You may report side effects to FDA at 1-800-FDA-1088. Where should I keep my medicine? Keep out of the reach of children. Store unopened pen in a refrigerator between 2 and 8 degrees C (36 and 46 degrees F). Do not freeze or use if the medicine has been frozen. Protect from light and excessive heat. After you first use the pen, it can be stored at room temperature between 15 and 30 degrees C (59 and 86 degrees F) or in a refrigerator. Throw away your used pen after 30 days or after the expiration date, whichever comes first. Do not store your pen with the needle attached. If the needle is left on, medicine may leak from the pen. NOTE: This sheet is a summary. It may not cover all possible information. If you have questions about this medicine, talk to your doctor, pharmacist, or health care provider.  2015, Elsevier/Gold Standard.  (2013-09-25 12:29:49) Lorcaserin oral tablets What is this medicine? LORCASERIN (lor ca SER in) is used to promote and maintain weight loss in obese patients. This medicine should be used with a reduced calorie diet and, if appropriate, an exercise program. This medicine may be used for other purposes; ask your health care provider or pharmacist if you have questions. COMMON BRAND NAME(S): Belviq What should I tell my health care provider before I take this medicine? They need to know if you have any of these conditions: -anatomical deformation of the penis, Peyronie's disease, or history of priapism (painful and prolonged erection) -diabetes -heart disease -history of blood diseases, like sickle cell anemia or leukemia -history of irregular heartbeat -kidney disease -liver disease -suicidal thoughts, plans, or attempt; a previous suicide attempt by you or a family member -an unusual or allergic reaction to lorcaserin, other medicines, foods, dyes, or preservatives -pregnant or trying to get pregnant -breast-feeding How should I use this medicine? Take this medicine by mouth with a glass of water. Follow the directions on the prescription label. You can take it with or without food. Take your medicine at regular intervals. Do not take it more often than directed. Do not stop taking except on your doctor's advice. Talk to your pediatrician regarding the use of this medicine in children. Special care may be needed. Overdosage: If you think you've taken  too much of this medicine contact a poison control center or emergency room at once. Overdosage: If you think you have taken too much of this medicine contact a poison control center or emergency room at once. NOTE: This medicine is only for you. Do not share this medicine with others. What if I miss a dose? If you miss a dose, take it as soon as you can. If it is almost time for your next dose, take only that dose. Do not take double or extra  doses. What may interact with this medicine? -cabergoline -certain medicines for depression, anxiety, or psychotic disturbances -certain medicines for erectile dysfunction -certain medicines for migraine headache like almotriptan, eletriptan, frovatriptan, naratriptan, rizatriptan, sumatriptan, zolmitriptan -dextromethorphan -linezolid -MAOIs like Carbex, Eldepryl, Marplan, Nardil, and Parnate -medicines for diabetes -orlistat -tramadol -St. John's Wort -stimulant medicines for attention disorders, weight loss, or to stay awake This list may not describe all possible interactions. Give your health care provider a list of all the medicines, herbs, non-prescription drugs, or dietary supplements you use. Also tell them if you smoke, drink alcohol, or use illegal drugs. Some items may interact with your medicine. What should I watch for while using this medicine? This medicine is intended to be used in addition to a healthy diet and appropriate exercise. The best results are achieved this way. Do not increase or in any way change your dose without consulting your doctor or health care professional. Your doctor should tell you to stop taking this medicine if you do not lose a certain amount of weight within the first 12 weeks of treatment. Visit your doctor or health care professional for regular checkups. Your doctor may order blood tests or other tests to see how you are doing. Do not drive, use machinery, or do anything that needs mental alertness until you know how this medicine affects you. This medicine may affect blood sugar levels. If you have diabetes, check with your doctor or health care professional before you change your diet or the dose of your diabetic medicine. Patients and their families should watch out for worsening depression or thoughts of suicide. Also watch out for sudden changes in feelings such as feeling anxious, agitated, panicky, irritable, hostile, aggressive, impulsive,  severely restless, overly excited and hyperactive, or not being able to sleep. If this happens, especially at the beginning of treatment or after a change in dose, call your health care professional. Contact you doctor or health care professional right away if the erection lasts longer than 4 hours or if it becomes painful. This may be a sign of serious problem and must be treated right away to prevent permanent damage. What side effects may I notice from receiving this medicine? Side effects that you should report to your doctor or health care professional as soon as possible: -allergic reactions like skin rash, itching or hives, swelling of the face, lips, or tongue -abnormal production of milk -breast enlargement in both males and females -breathing problems -changes in emotions or moods -changes in vision -confusion -erection lasting more than 4 hours -fast or irregular heart beat -feeling faint or lightheaded, falls -fever or chills, sore throat -hallucination, loss of contact with reality -high or low blood pressure -menstrual changes -restlessness -seizures -slow or irregular heartbeat -stiff muscles -sweating -suicidal thoughts or other mood changes -tremors -trouble walking -unusually weak or tired -vomiting Side effects that usually do not require medical attention (Report these to your doctor or health care professional if they continue or are  bothersome.): -back pain -constipation -cough -dizziness -dry mouth -low blood sugar if you have diabetes (ask your doctor or healthcare professional for a list of these symptoms) -nausea -tiredness This list may not describe all possible side effects. Call your doctor for medical advice about side effects. You may report side effects to FDA at 1-800-FDA-1088. Where should I keep my medicine? Keep out of the reach of children. Store at room temperature between 15 and 30 degrees C (59 and 86 degrees F). Throw away any unused  medicine after the expiration date. NOTE: This sheet is a summary. It may not cover all possible information. If you have questions about this medicine, talk to your doctor, pharmacist, or health care provider.  2015, Elsevier/Gold Standard. (2011-02-14 17:15:17)

## 2014-10-16 DIAGNOSIS — D1803 Hemangioma of intra-abdominal structures: Secondary | ICD-10-CM | POA: Diagnosis not present

## 2014-10-16 DIAGNOSIS — R112 Nausea with vomiting, unspecified: Secondary | ICD-10-CM | POA: Diagnosis not present

## 2014-10-16 DIAGNOSIS — R7309 Other abnormal glucose: Secondary | ICD-10-CM | POA: Diagnosis not present

## 2014-10-16 DIAGNOSIS — G894 Chronic pain syndrome: Secondary | ICD-10-CM | POA: Diagnosis not present

## 2014-10-16 DIAGNOSIS — K2270 Barrett's esophagus without dysplasia: Secondary | ICD-10-CM | POA: Diagnosis not present

## 2014-10-16 DIAGNOSIS — K760 Fatty (change of) liver, not elsewhere classified: Secondary | ICD-10-CM | POA: Diagnosis not present

## 2014-10-16 DIAGNOSIS — Z87820 Personal history of traumatic brain injury: Secondary | ICD-10-CM | POA: Diagnosis not present

## 2014-10-16 DIAGNOSIS — G35 Multiple sclerosis: Secondary | ICD-10-CM | POA: Diagnosis not present

## 2014-10-16 DIAGNOSIS — E876 Hypokalemia: Secondary | ICD-10-CM | POA: Diagnosis not present

## 2014-10-16 DIAGNOSIS — F4310 Post-traumatic stress disorder, unspecified: Secondary | ICD-10-CM | POA: Diagnosis not present

## 2014-10-16 DIAGNOSIS — M545 Low back pain: Secondary | ICD-10-CM | POA: Diagnosis not present

## 2014-10-16 DIAGNOSIS — R1031 Right lower quadrant pain: Secondary | ICD-10-CM | POA: Diagnosis not present

## 2014-10-16 DIAGNOSIS — I10 Essential (primary) hypertension: Secondary | ICD-10-CM | POA: Diagnosis not present

## 2014-10-16 DIAGNOSIS — E269 Hyperaldosteronism, unspecified: Secondary | ICD-10-CM | POA: Diagnosis not present

## 2014-10-16 LAB — BASIC METABOLIC PANEL
Creatinine: 0.5 mg/dL (ref 0.5–1.1)
Glucose: 105 mg/dL
Potassium: 3.7 mmol/L (ref 3.4–5.3)
Sodium: 143 mmol/L (ref 137–147)

## 2014-10-16 LAB — COMPREHENSIVE METABOLIC PANEL: EGFR (Non-African Amer.): >90

## 2014-10-17 DIAGNOSIS — D1803 Hemangioma of intra-abdominal structures: Secondary | ICD-10-CM | POA: Diagnosis not present

## 2014-10-18 IMAGING — CR DG CHEST 2V
2 series · 2 of 2 positions shown · non-contrast
Comparison: [HOSPITAL] CTA chest dated 07/17/2013

CLINICAL DATA: Chest pain, nausea

EXAM:
CHEST  2 VIEW

[w chest pa]
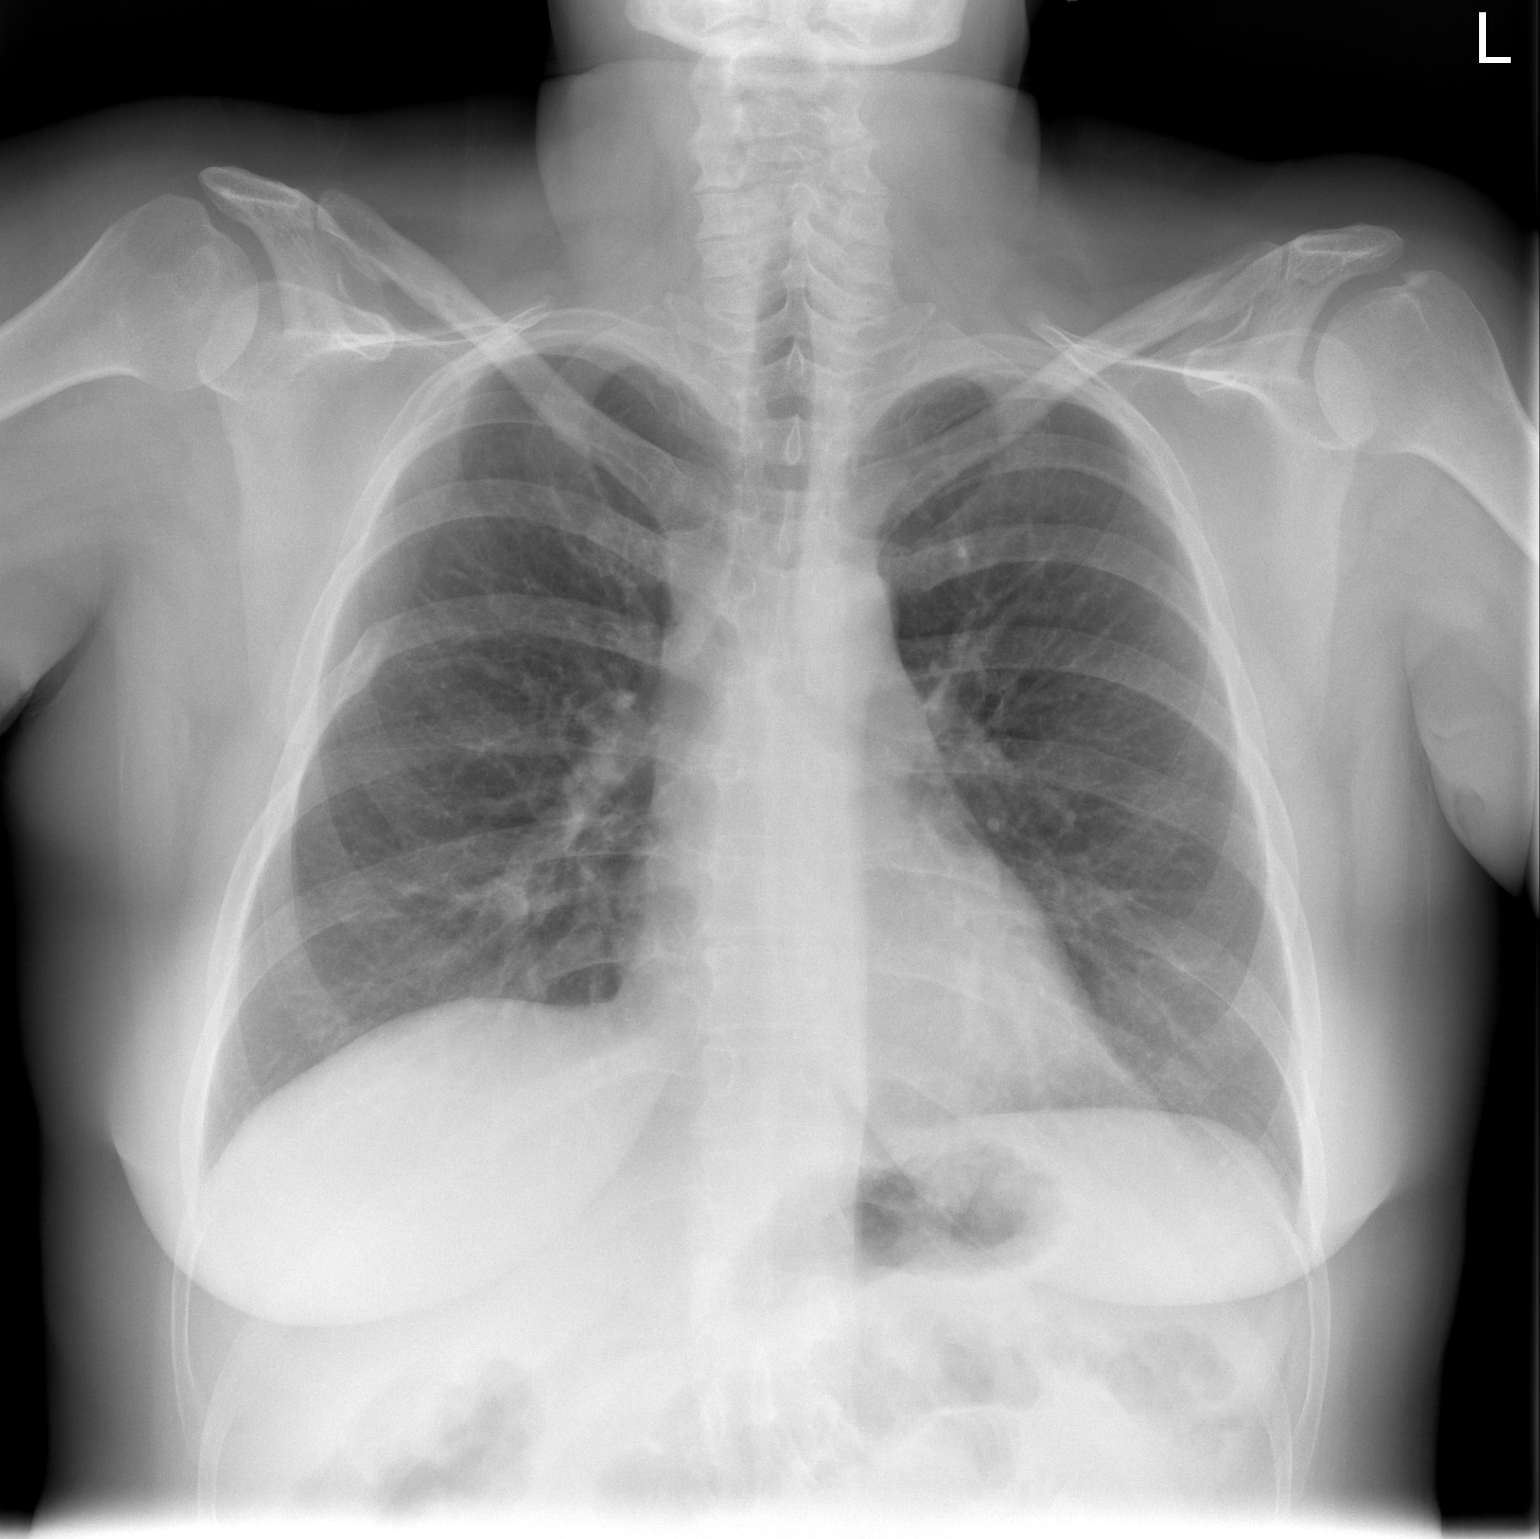

[w chest lat]
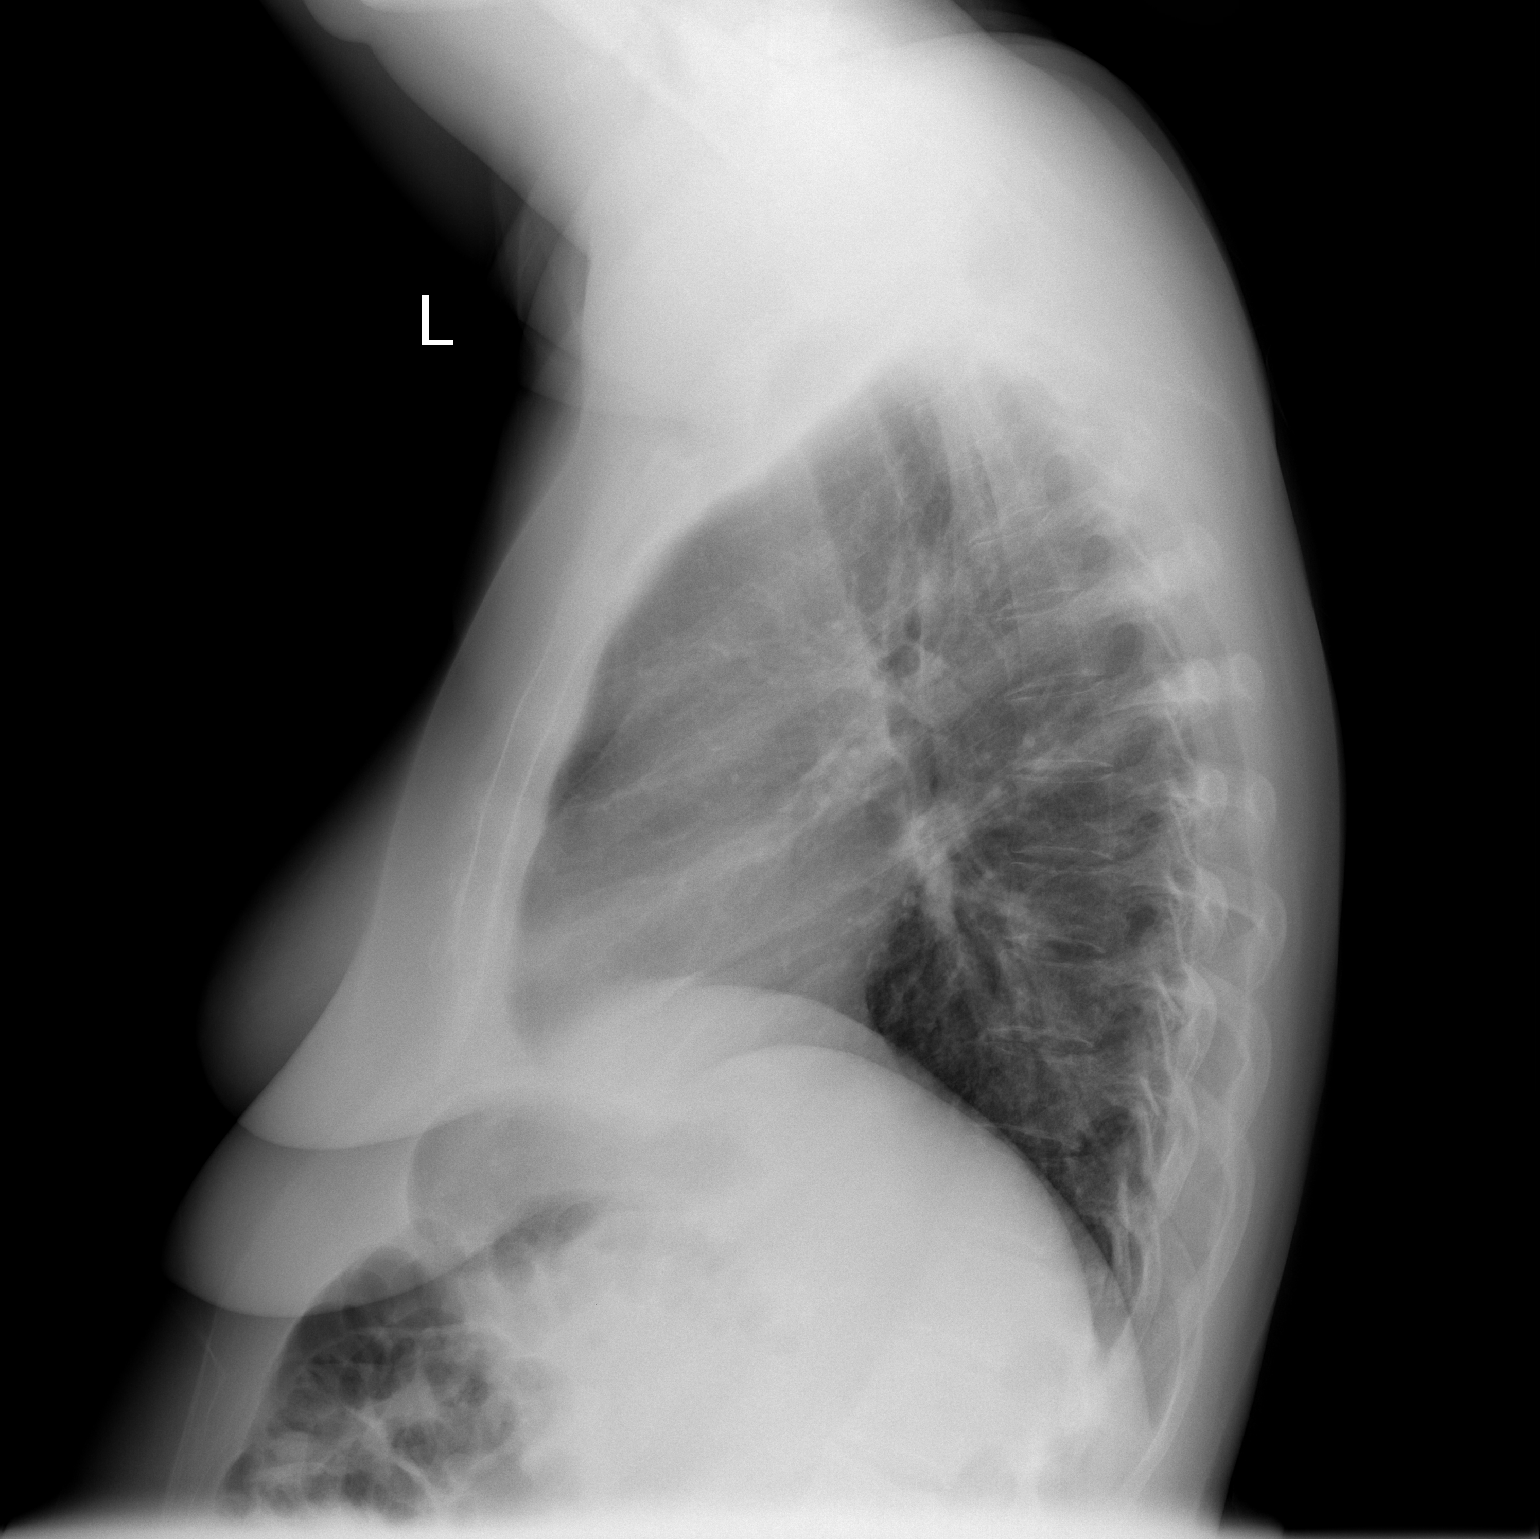

[2 of 2 positions shown; findings below may reference images not displayed]

FINDINGS: Lungs are clear. No pleural effusion or pneumothorax.

The heart is normal in size.

Right lateral 6th rib deformity.
IMPRESSION: No evidence of acute cardiopulmonary disease.

## 2014-10-19 ENCOUNTER — Telehealth: Payer: Self-pay | Admitting: Family Medicine

## 2014-10-19 DIAGNOSIS — J029 Acute pharyngitis, unspecified: Secondary | ICD-10-CM | POA: Diagnosis not present

## 2014-10-19 DIAGNOSIS — G35 Multiple sclerosis: Secondary | ICD-10-CM | POA: Diagnosis not present

## 2014-10-19 DIAGNOSIS — F4310 Post-traumatic stress disorder, unspecified: Secondary | ICD-10-CM | POA: Diagnosis not present

## 2014-10-19 DIAGNOSIS — J309 Allergic rhinitis, unspecified: Secondary | ICD-10-CM | POA: Diagnosis not present

## 2014-10-19 DIAGNOSIS — G894 Chronic pain syndrome: Secondary | ICD-10-CM | POA: Diagnosis not present

## 2014-10-19 DIAGNOSIS — I10 Essential (primary) hypertension: Secondary | ICD-10-CM | POA: Diagnosis not present

## 2014-10-19 DIAGNOSIS — T7800XA Anaphylactic reaction due to unspecified food, initial encounter: Secondary | ICD-10-CM | POA: Diagnosis not present

## 2014-10-19 DIAGNOSIS — I471 Supraventricular tachycardia: Secondary | ICD-10-CM | POA: Diagnosis not present

## 2014-10-19 DIAGNOSIS — Z9889 Other specified postprocedural states: Secondary | ICD-10-CM | POA: Diagnosis not present

## 2014-10-19 DIAGNOSIS — Z90710 Acquired absence of both cervix and uterus: Secondary | ICD-10-CM | POA: Diagnosis not present

## 2014-10-19 DIAGNOSIS — J4520 Mild intermittent asthma, uncomplicated: Secondary | ICD-10-CM | POA: Diagnosis not present

## 2014-10-19 DIAGNOSIS — Z79899 Other long term (current) drug therapy: Secondary | ICD-10-CM | POA: Diagnosis not present

## 2014-10-19 NOTE — Telephone Encounter (Signed)
Re:  Referral [facility] for PT Re: Jennifer [name], scheduler with [facility] called. They do not think this patient is a good match  because over the years they have tried to help this patient and have not been successful.

## 2014-10-19 NOTE — Telephone Encounter (Signed)
Call patient: Her potassium is in the normal range but borderline low. I want her to get back on her potassium. I believe when she was here last time she had said that she had discontinued it.

## 2014-10-20 DIAGNOSIS — J45909 Unspecified asthma, uncomplicated: Secondary | ICD-10-CM | POA: Diagnosis not present

## 2014-10-20 DIAGNOSIS — G35 Multiple sclerosis: Secondary | ICD-10-CM | POA: Diagnosis not present

## 2014-10-20 DIAGNOSIS — K219 Gastro-esophageal reflux disease without esophagitis: Secondary | ICD-10-CM | POA: Diagnosis not present

## 2014-10-20 DIAGNOSIS — R0602 Shortness of breath: Secondary | ICD-10-CM | POA: Diagnosis not present

## 2014-10-20 DIAGNOSIS — Z87891 Personal history of nicotine dependence: Secondary | ICD-10-CM | POA: Diagnosis not present

## 2014-10-20 DIAGNOSIS — F419 Anxiety disorder, unspecified: Secondary | ICD-10-CM | POA: Diagnosis not present

## 2014-10-20 DIAGNOSIS — J040 Acute laryngitis: Secondary | ICD-10-CM | POA: Diagnosis not present

## 2014-10-20 DIAGNOSIS — F329 Major depressive disorder, single episode, unspecified: Secondary | ICD-10-CM | POA: Diagnosis not present

## 2014-10-20 DIAGNOSIS — I10 Essential (primary) hypertension: Secondary | ICD-10-CM | POA: Diagnosis not present

## 2014-10-20 NOTE — Telephone Encounter (Signed)
Pt informed.Jennifer [name] Lynetta  

## 2014-10-20 NOTE — Telephone Encounter (Signed)
Pt informed of results and recommendations. She said that her tongue was swelling and she has been talking to [facility] about this. She informed them that Dr. Madilyn Fireman had done an [place] of this last yr and was told to f/u on this in 1 yr. Jennifer [name]'

## 2014-10-22 ENCOUNTER — Emergency Department (HOSPITAL_BASED_OUTPATIENT_CLINIC_OR_DEPARTMENT_OTHER): Payer: Medicare Other

## 2014-10-22 ENCOUNTER — Emergency Department (HOSPITAL_BASED_OUTPATIENT_CLINIC_OR_DEPARTMENT_OTHER)
Admission: EM | Admit: 2014-10-22 | Discharge: 2014-10-22 | Disposition: A | Discharge status: Home / Self Care | Payer: Medicare Other | Attending: Emergency Medicine | Admitting: Emergency Medicine

## 2014-10-22 ENCOUNTER — Encounter (HOSPITAL_BASED_OUTPATIENT_CLINIC_OR_DEPARTMENT_OTHER): Payer: Self-pay | Admitting: *Deleted

## 2014-10-22 DIAGNOSIS — R1110 Vomiting, unspecified: Secondary | ICD-10-CM | POA: Diagnosis present

## 2014-10-22 DIAGNOSIS — Z880 Allergy status to penicillin: Secondary | ICD-10-CM | POA: Insufficient documentation

## 2014-10-22 DIAGNOSIS — G4730 Sleep apnea, unspecified: Secondary | ICD-10-CM | POA: Insufficient documentation

## 2014-10-22 DIAGNOSIS — Z9981 Dependence on supplemental oxygen: Secondary | ICD-10-CM | POA: Insufficient documentation

## 2014-10-22 DIAGNOSIS — J45909 Unspecified asthma, uncomplicated: Secondary | ICD-10-CM | POA: Diagnosis not present

## 2014-10-22 DIAGNOSIS — Z8614 Personal history of Methicillin resistant Staphylococcus aureus infection: Secondary | ICD-10-CM | POA: Insufficient documentation

## 2014-10-22 DIAGNOSIS — Z862 Personal history of diseases of the blood and blood-forming organs and certain disorders involving the immune mechanism: Secondary | ICD-10-CM | POA: Insufficient documentation

## 2014-10-22 DIAGNOSIS — Z8639 Personal history of other endocrine, nutritional and metabolic disease: Secondary | ICD-10-CM | POA: Diagnosis not present

## 2014-10-22 DIAGNOSIS — I10 Essential (primary) hypertension: Secondary | ICD-10-CM | POA: Insufficient documentation

## 2014-10-22 DIAGNOSIS — R51 Headache: Secondary | ICD-10-CM

## 2014-10-22 DIAGNOSIS — R519 Headache, unspecified: Secondary | ICD-10-CM

## 2014-10-22 DIAGNOSIS — R55 Syncope and collapse: Secondary | ICD-10-CM | POA: Insufficient documentation

## 2014-10-22 DIAGNOSIS — Z8542 Personal history of malignant neoplasm of other parts of uterus: Secondary | ICD-10-CM | POA: Insufficient documentation

## 2014-10-22 DIAGNOSIS — Z79899 Other long term (current) drug therapy: Secondary | ICD-10-CM | POA: Diagnosis not present

## 2014-10-22 DIAGNOSIS — Z7952 Long term (current) use of systemic steroids: Secondary | ICD-10-CM | POA: Insufficient documentation

## 2014-10-22 DIAGNOSIS — G35 Multiple sclerosis: Secondary | ICD-10-CM | POA: Insufficient documentation

## 2014-10-22 DIAGNOSIS — Z9889 Other specified postprocedural states: Secondary | ICD-10-CM | POA: Diagnosis not present

## 2014-10-22 DIAGNOSIS — B349 Viral infection, unspecified: Secondary | ICD-10-CM

## 2014-10-22 DIAGNOSIS — K219 Gastro-esophageal reflux disease without esophagitis: Secondary | ICD-10-CM | POA: Insufficient documentation

## 2014-10-22 DIAGNOSIS — Z87891 Personal history of nicotine dependence: Secondary | ICD-10-CM | POA: Diagnosis not present

## 2014-10-22 DIAGNOSIS — Z8659 Personal history of other mental and behavioral disorders: Secondary | ICD-10-CM | POA: Insufficient documentation

## 2014-10-22 DIAGNOSIS — R42 Dizziness and giddiness: Secondary | ICD-10-CM | POA: Diagnosis not present

## 2014-10-22 LAB — CBC WITH DIFFERENTIAL/PLATELET
Basophils Absolute: 0 10*3/uL (ref 0.0–0.1)
Basophils Relative: 0 % (ref 0–1)
Eosinophils Absolute: 0.1 10*3/uL (ref 0.0–0.7)
Eosinophils Relative: 2 % (ref 0–5)
HCT: 40 % (ref 36.0–46.0)
Hemoglobin: 13 g/dL (ref 12.0–15.0)
Lymphocytes Relative: 19 % (ref 12–46)
Lymphs Abs: 1.3 10*3/uL (ref 0.7–4.0)
MCH: 28.8 pg (ref 26.0–34.0)
MCHC: 32.5 g/dL (ref 30.0–36.0)
MCV: 88.5 fL (ref 78.0–100.0)
Monocytes Absolute: 0.6 10*3/uL (ref 0.1–1.0)
Monocytes Relative: 9 % (ref 3–12)
Neutro Abs: 4.7 10*3/uL (ref 1.7–7.7)
Neutrophils Relative %: 70 % (ref 43–77)
Platelets: 224 10*3/uL (ref 150–400)
RBC: 4.52 MIL/uL (ref 3.87–5.11)
RDW: 14.6 % (ref 11.5–15.5)
WBC: 6.7 10*3/uL (ref 4.0–10.5)

## 2014-10-22 LAB — BASIC METABOLIC PANEL
Anion gap: 8 (ref 5–15)
BUN: 14 mg/dL (ref 6–23)
CO2: 26 mmol/L (ref 19–32)
Calcium: 8.9 mg/dL (ref 8.4–10.5)
Chloride: 105 mmol/L (ref 96–112)
Creatinine, Ser: 0.75 mg/dL (ref 0.50–1.10)
GFR calc Af Amer: >90 mL/min (ref >90)
GFR calc non Af Amer: >90 mL/min (ref >90)
Glucose, Bld: 111 mg/dL — ABNORMAL HIGH (ref 70–99)
Potassium: 3.4 mmol/L — ABNORMAL LOW (ref 3.5–5.1)
Sodium: 139 mmol/L (ref 135–145)

## 2014-10-22 MED ORDER — ONDANSETRON HCL 4 MG/2ML IJ SOLN
4.0000 mg | Freq: Once | INTRAMUSCULAR | Status: AC
  Administered 2014-10-22: 4 mg via INTRAVENOUS
  Filled 2014-10-22: qty 2

## 2014-10-22 MED ORDER — SODIUM CHLORIDE 0.9 % IV SOLN: INTRAVENOUS | Status: DC

## 2014-10-22 MED ORDER — SODIUM CHLORIDE 0.9 % IV BOLUS (SEPSIS)
1000.0000 mL | Freq: Once | INTRAVENOUS | Status: AC
  Administered 2014-10-22: 1000 mL via INTRAVENOUS

## 2014-10-22 MED ORDER — ONDANSETRON 4 MG PO TBDP: 4.0000 mg | ORAL_TABLET | Freq: Three times a day (TID) | ORAL | Status: DC | PRN

## 2014-10-22 NOTE — Discharge Instructions (Signed)
Exam Zofran as needed. If symptoms persist follow-up with your neurologist. Return for any new or worse symptoms. Workup today without any significant findings.

## 2014-10-22 NOTE — ED Notes (Signed)
Woke with lightheadedness. Vomiting after going for a walk. Headache now.
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
## 2014-10-23 NOTE — Addendum Note (Signed)
Addended by: Teddy Spike on: 10/23/2014 08:07 AM   Modules accepted: Orders

## 2014-10-26 ENCOUNTER — Encounter: Payer: Self-pay | Admitting: Family Medicine

## 2014-10-27 ENCOUNTER — Encounter: Payer: Self-pay | Admitting: Family Medicine

## 2014-10-28 ENCOUNTER — Encounter: Payer: Self-pay | Admitting: Family Medicine

## 2014-10-29 DIAGNOSIS — J309 Allergic rhinitis, unspecified: Secondary | ICD-10-CM | POA: Diagnosis not present

## 2014-10-29 DIAGNOSIS — J4530 Mild persistent asthma, uncomplicated: Secondary | ICD-10-CM | POA: Diagnosis not present

## 2014-10-29 DIAGNOSIS — E876 Hypokalemia: Secondary | ICD-10-CM | POA: Diagnosis not present

## 2014-10-29 DIAGNOSIS — L500 Allergic urticaria: Secondary | ICD-10-CM | POA: Diagnosis not present

## 2014-10-29 DIAGNOSIS — D649 Anemia, unspecified: Secondary | ICD-10-CM | POA: Diagnosis not present

## 2014-10-30 DIAGNOSIS — R194 Change in bowel habit: Secondary | ICD-10-CM | POA: Diagnosis not present

## 2014-10-30 DIAGNOSIS — K5900 Constipation, unspecified: Secondary | ICD-10-CM | POA: Diagnosis not present

## 2014-10-30 DIAGNOSIS — K625 Hemorrhage of anus and rectum: Secondary | ICD-10-CM | POA: Diagnosis not present

## 2014-10-30 DIAGNOSIS — R1084 Generalized abdominal pain: Secondary | ICD-10-CM | POA: Diagnosis not present

## 2014-10-30 DIAGNOSIS — R143 Flatulence: Secondary | ICD-10-CM | POA: Diagnosis not present

## 2014-10-30 DIAGNOSIS — R109 Unspecified abdominal pain: Secondary | ICD-10-CM | POA: Diagnosis not present

## 2014-10-30 DIAGNOSIS — R1031 Right lower quadrant pain: Secondary | ICD-10-CM | POA: Diagnosis not present

## 2014-10-30 DIAGNOSIS — Z8719 Personal history of other diseases of the digestive system: Secondary | ICD-10-CM | POA: Diagnosis not present

## 2014-10-30 DIAGNOSIS — Z8541 Personal history of malignant neoplasm of cervix uteri: Secondary | ICD-10-CM | POA: Diagnosis not present

## 2014-10-30 DIAGNOSIS — R0602 Shortness of breath: Secondary | ICD-10-CM | POA: Diagnosis not present

## 2014-10-30 DIAGNOSIS — R140 Abdominal distension (gaseous): Secondary | ICD-10-CM | POA: Diagnosis not present

## 2014-10-30 DIAGNOSIS — R1030 Lower abdominal pain, unspecified: Secondary | ICD-10-CM | POA: Diagnosis not present

## 2014-10-30 DIAGNOSIS — R1900 Intra-abdominal and pelvic swelling, mass and lump, unspecified site: Secondary | ICD-10-CM | POA: Diagnosis not present

## 2014-10-30 IMAGING — CT CT ABD-PELV W/ CM
2 of 6 series · 16 of 46 positions shown, 18 images · IV contrast (APPLIED)
Comparison: CT of the abdomen and pelvis performed 10/23/2012, and
abdominal ultrasound performed 07/07/2013

ADDENDUM:
Patient reportedly has had previous appendectomy. The area denoted
as appendix appears to represent a pelvic ligamentous structure. It
appears normal.
CLINICAL DATA: Left upper quadrant abdominal pain. Recent
hysterectomy.

EXAM:
CT ABDOMEN AND PELVIS WITH CONTRAST
TECHNIQUE: Multidetector CT imaging of the abdomen and pelvis was performed
using the standard protocol following bolus administration of
intravenous contrast.
CONTRAST:  100mL OMNIPAQUE IOHEXOL 300 MG/ML  SOLN

[Series 2: abd/pelvis 5.0 b31f · axial · 0.86mm/px · z∈[-426,-21]mm · 13 of 95 slices shown, 15 images]
[im 7/95  soft-tissue]
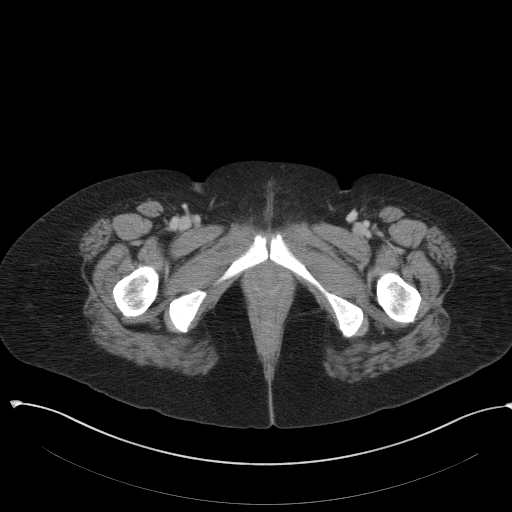
[im 7/95  bone]
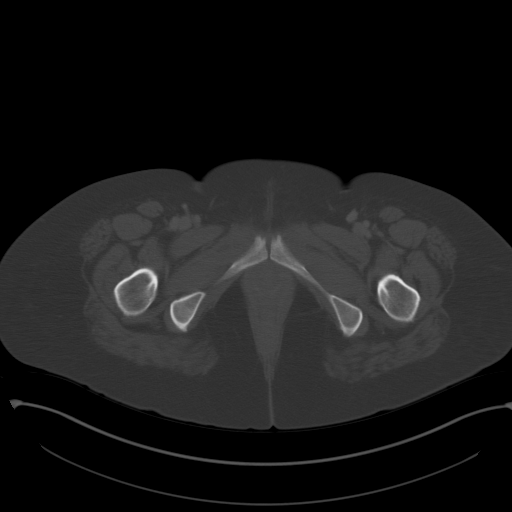
[im 13/95  soft-tissue]
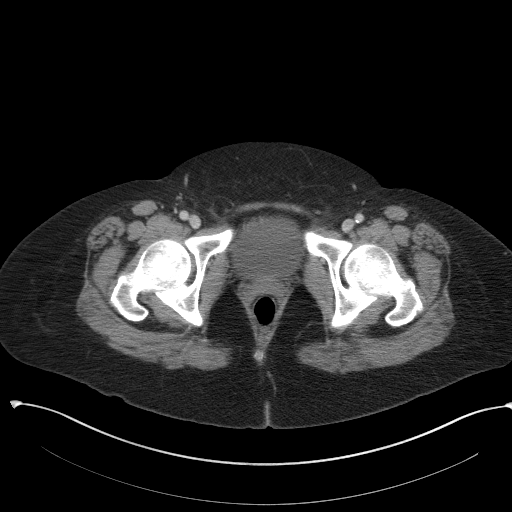
[im 19/95  soft-tissue]
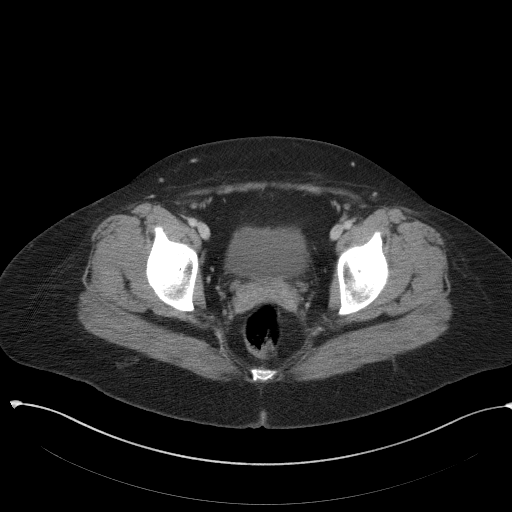
[im 26/95  soft-tissue]
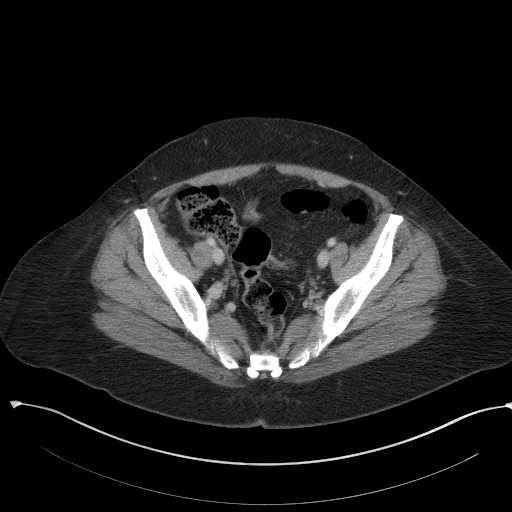
[im 32/95  soft-tissue]
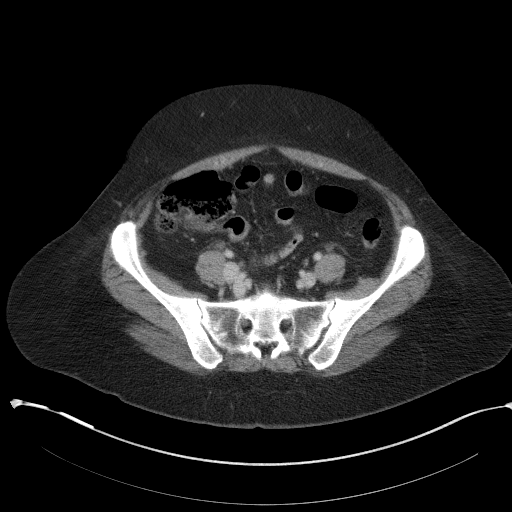
[im 38/95  soft-tissue]
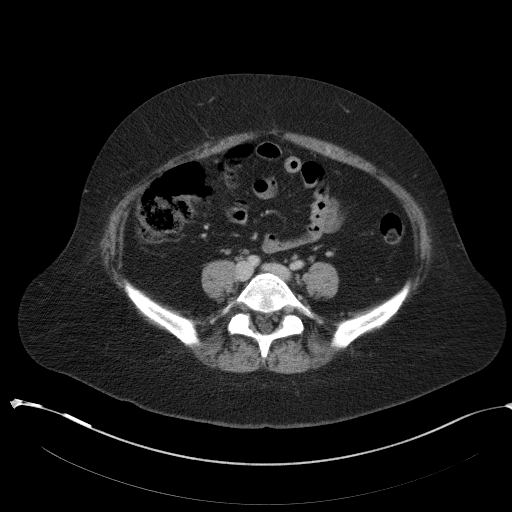
[im 51/95  soft-tissue]
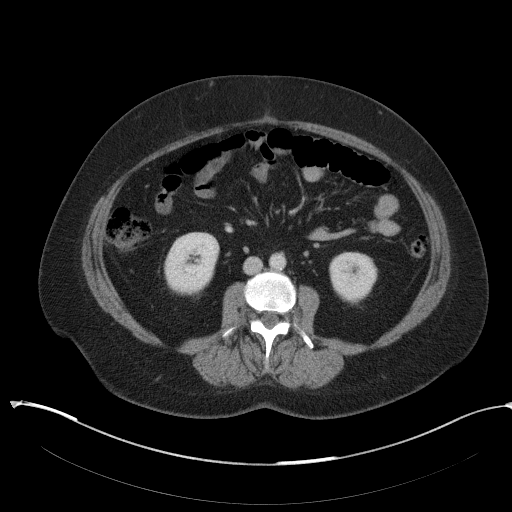
[im 57/95  soft-tissue]
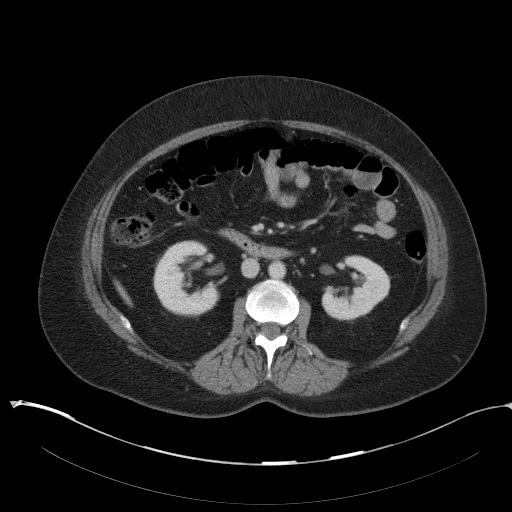
[im 63/95  soft-tissue]
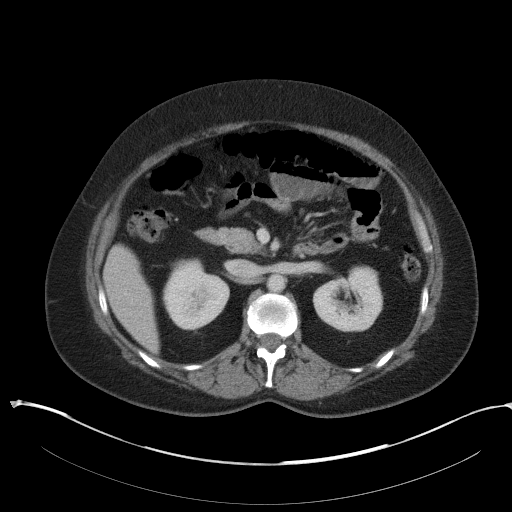
[im 63/95  bone]
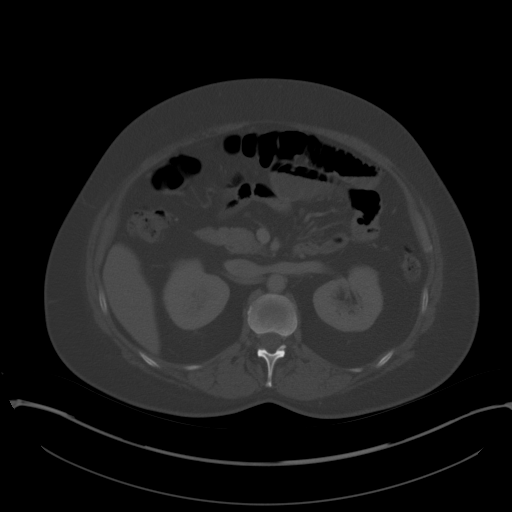
[im 69/95  soft-tissue]
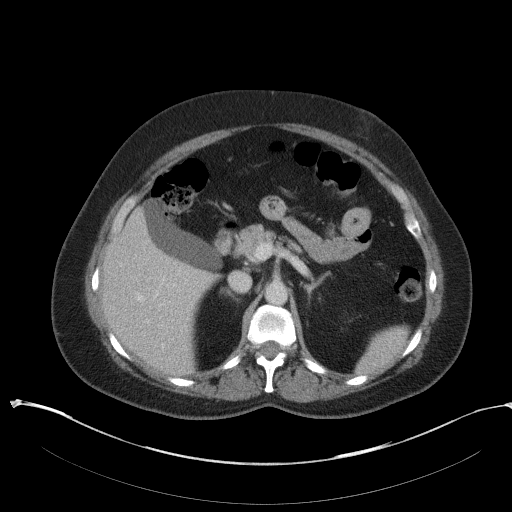
[im 76/95  soft-tissue]
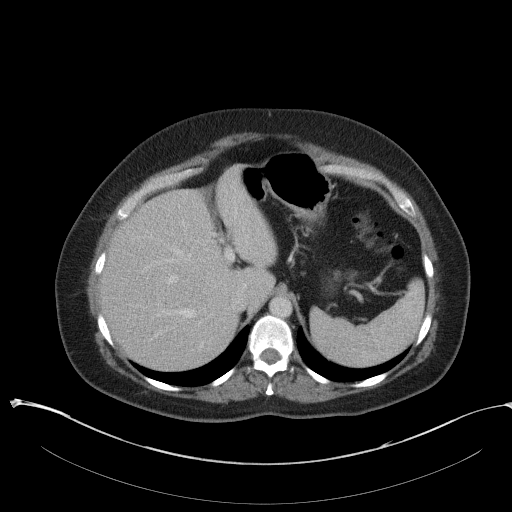
[im 82/95  soft-tissue]
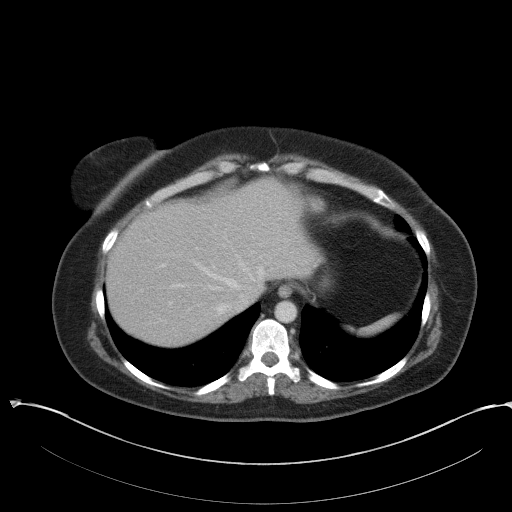
[im 88/95  soft-tissue]
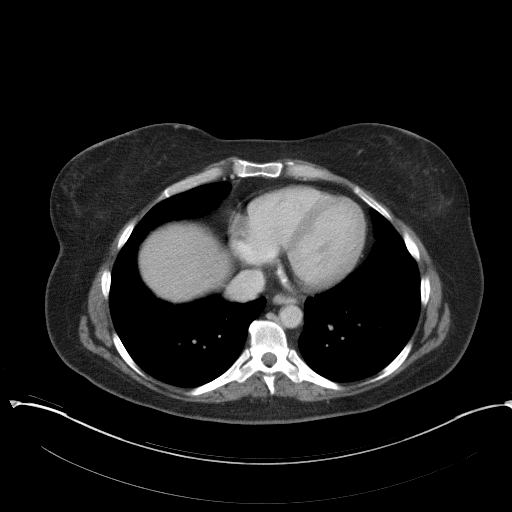

[Series 5: abd/pelvis 3.0 coronal · coronal · 0.91mm/px · 3 of 97 slices shown]
[im 33/97  soft-tissue]
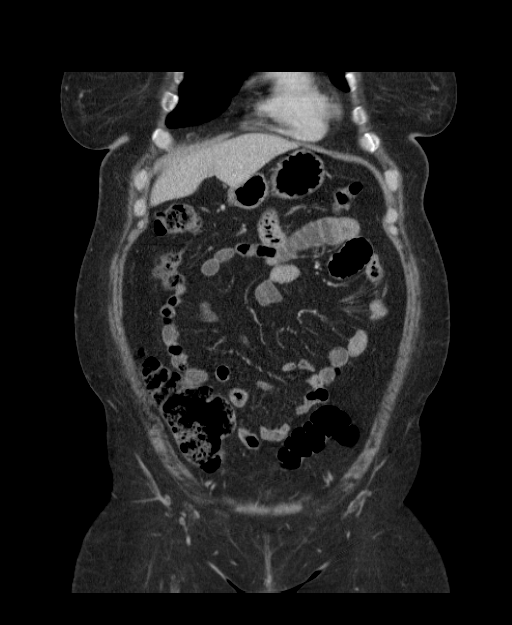
[im 43/97  soft-tissue]
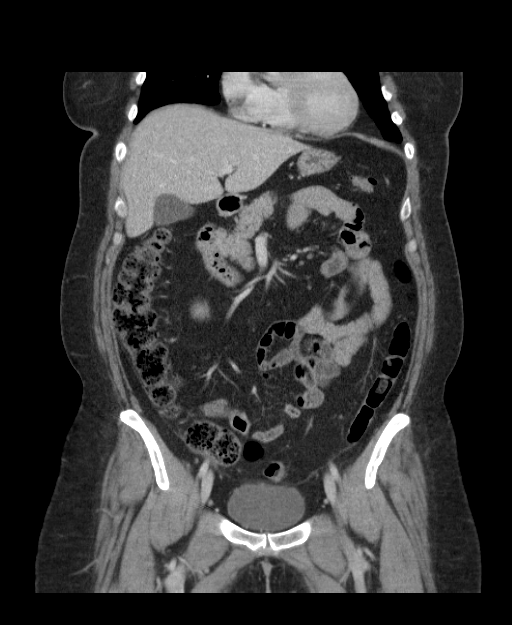
[im 54/97  soft-tissue]
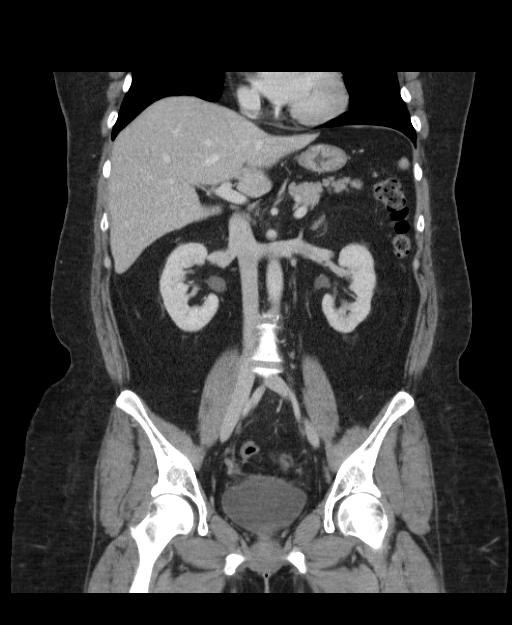

[16 of 46 positions shown; findings below may reference images not displayed]

FINDINGS: The visualized lung bases are clear.

A 1.4 cm hypodensity at the right hepatic lobe demonstrates minimal
nodular filling and most likely reflects an hemangioma. The
gallbladder is within normal limits. The pancreas and adrenal glands
are unremarkable.

Minimal nonspecific perinephric stranding is noted bilaterally. The
kidneys are otherwise unremarkable in appearance. There is no
evidence of hydronephrosis. No renal or ureteral stones are seen.

The small bowel is unremarkable in appearance. The stomach is within
normal limits. No acute vascular abnormalities are seen. Minimal
calcification is noted at the distal abdominal aorta.

The appendix appears to extend to the edge of the right inguinal
canal, and is unremarkable in appearance. There is no evidence for
appendicitis. The colon is partially decompressed and is
unremarkable in appearance.

The bladder is mildly distended and grossly unremarkable. The
patient is status posthysterectomy. Trace fluid within the pelvis is
likely physiologic in nature. No suspicious adnexal masses are seen.
No inguinal lymphadenopathy is seen.

No acute osseous abnormalities are identified.
IMPRESSION: 1. No acute abnormality seen within the abdomen or pelvis.
2. Status post hysterectomy; the vaginal cuff is unremarkable in
appearance.
3. Stable 1.4 cm hemangioma at the right hepatic lobe.

## 2014-10-30 IMAGING — CR DG ABDOMEN ACUTE W/ 1V CHEST
4 series · 4 of 4 positions shown · non-contrast
Comparison: Chest in two views abdomen 07/31/2013.

CLINICAL DATA: Abdominal pain. Status post hysterectomy 07/29/2013.

EXAM:
ACUTE ABDOMEN SERIES (ABDOMEN 2 VIEW & CHEST 1 VIEW)

[w chest pa]
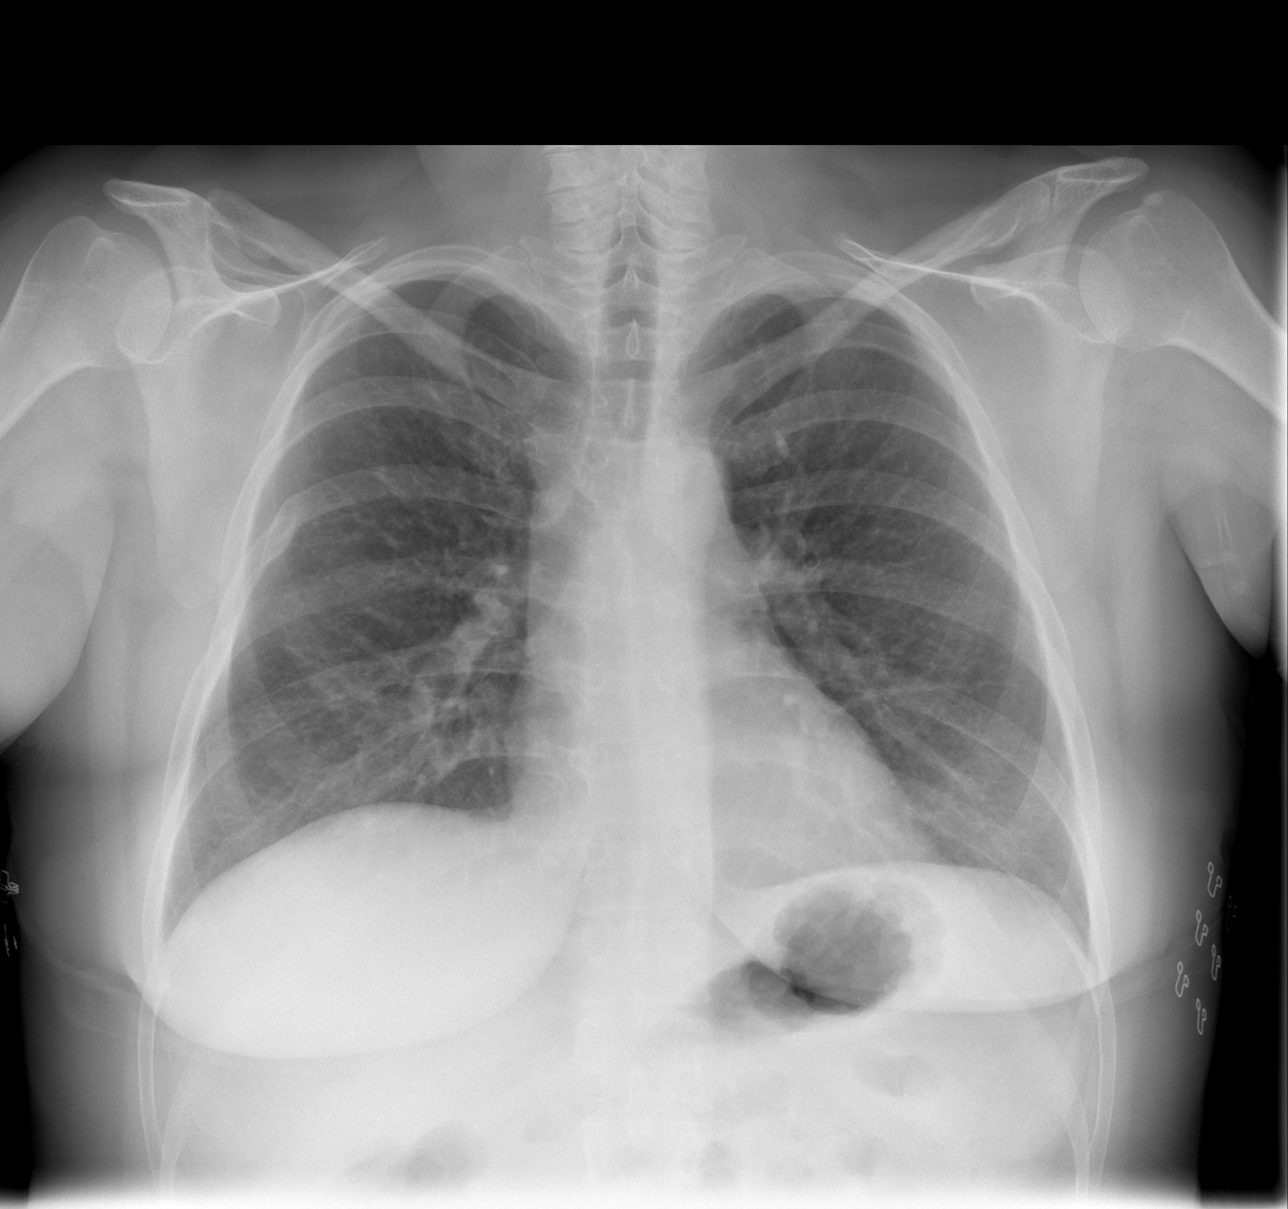

[w abdomen upright]
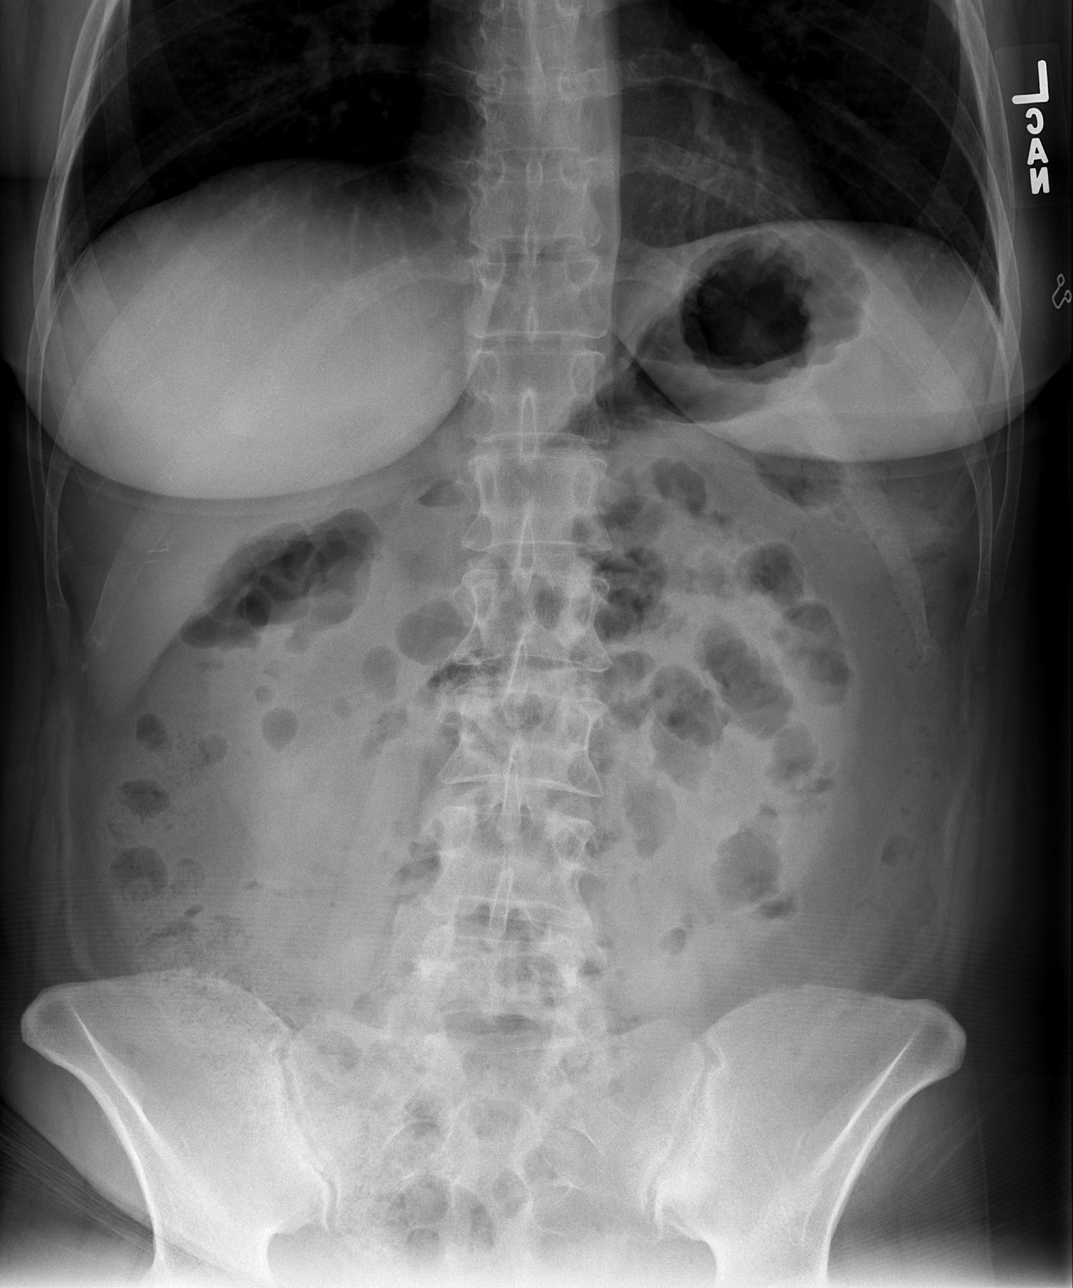

[t abdomen supine (1 of 2)]
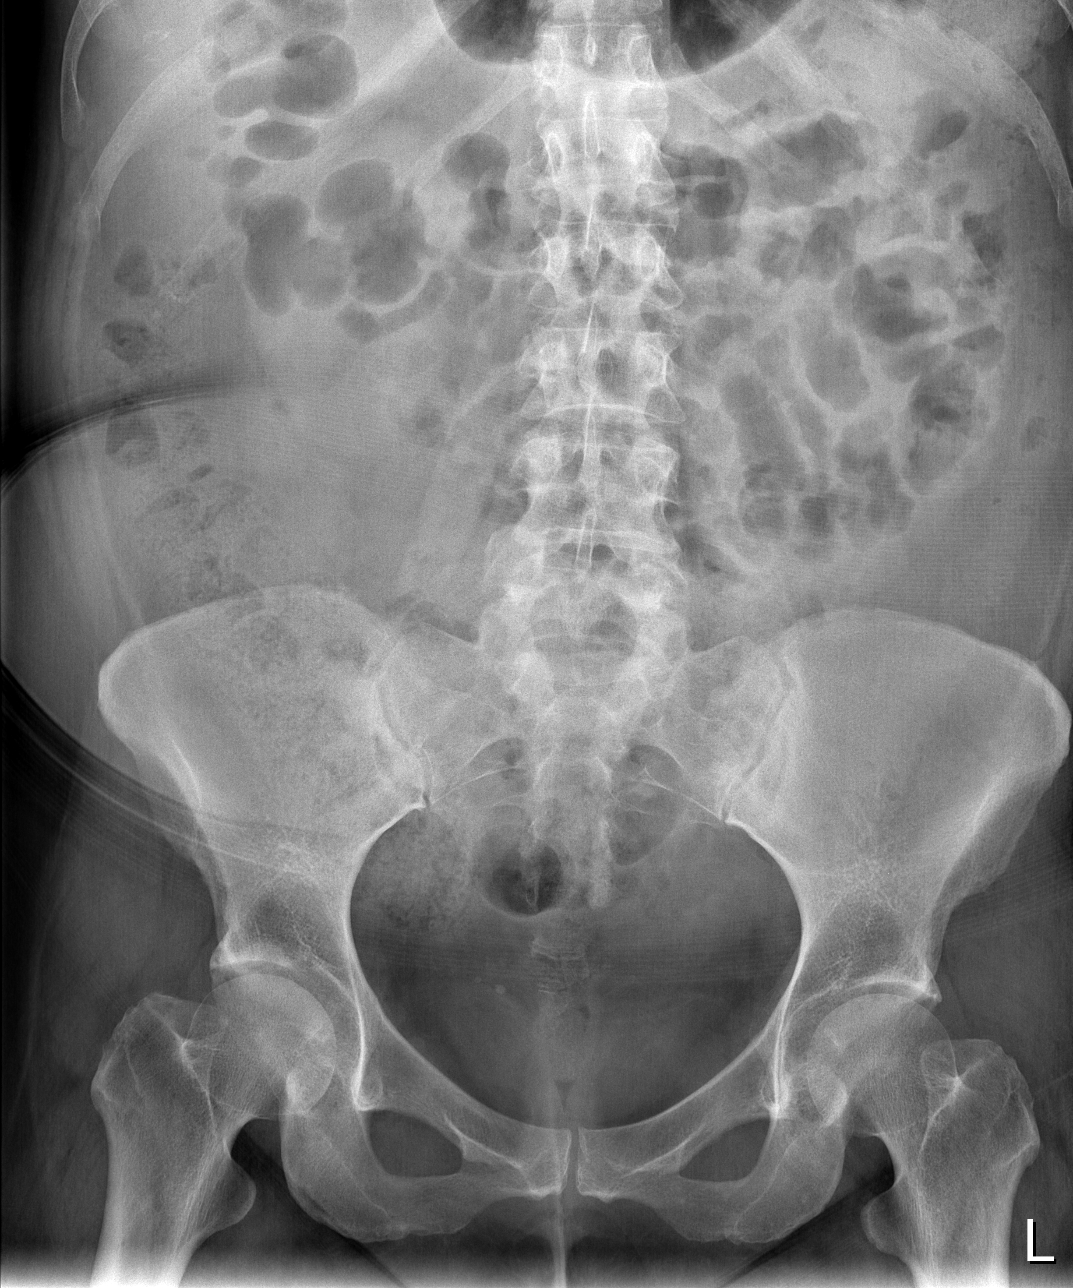

[t abdomen supine (2 of 2)]
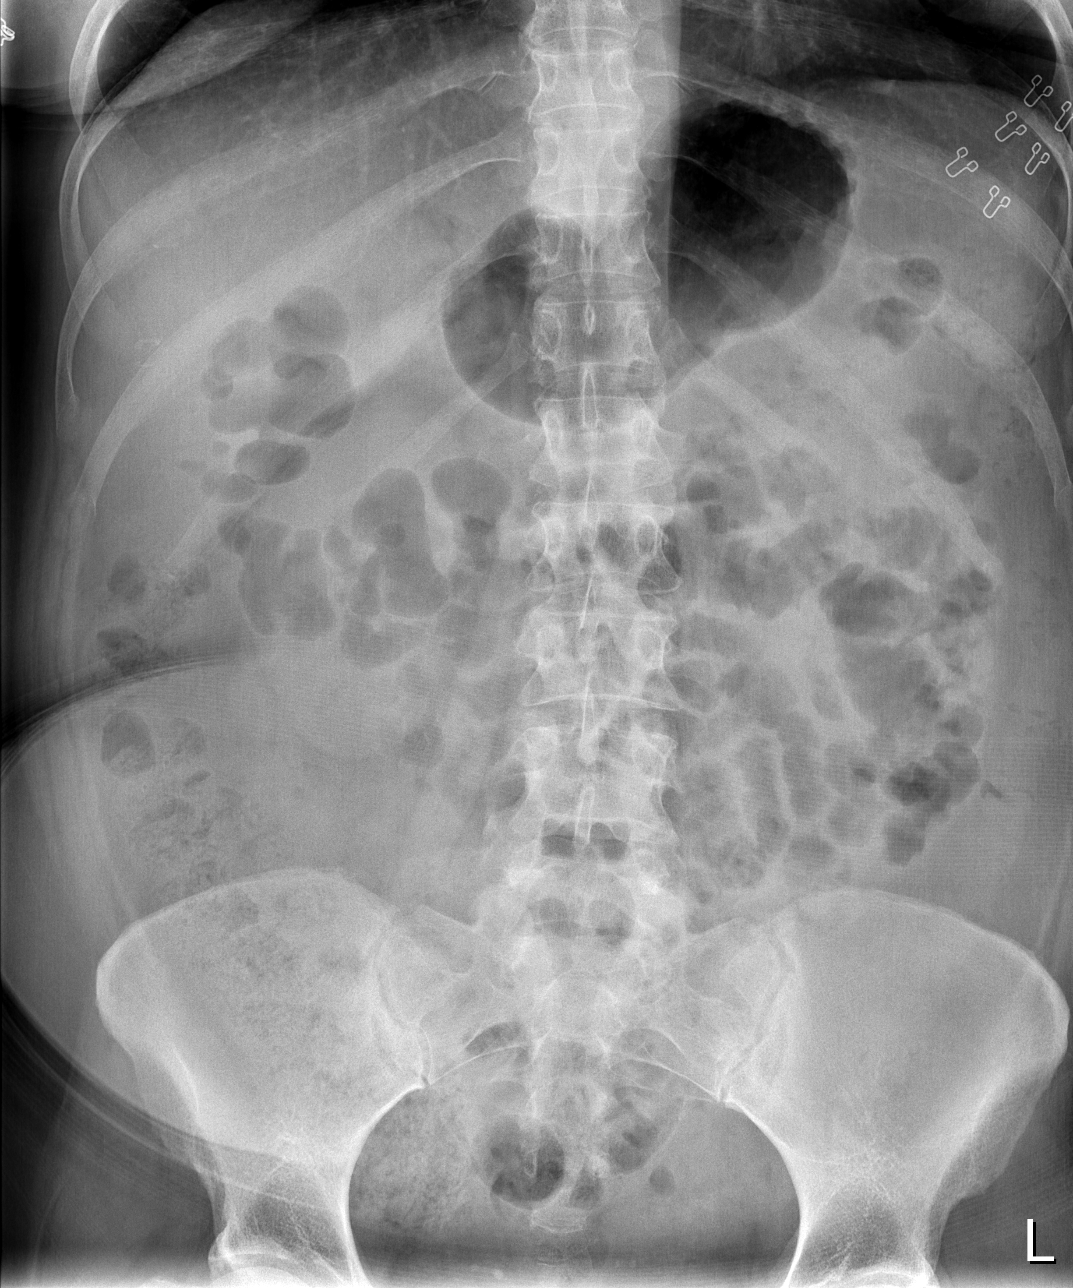

[4 of 4 positions shown; findings below may reference images not displayed]

FINDINGS: Single view of the chest demonstrates clear lungs and normal heart
size. No pneumothorax or pleural effusion. Remote right 6th rib
fracture is noted.

Two views of the abdomen show no free intraperitoneal air. The bowel
gas pattern is nonobstructive. No abnormal abdominal calcification
is seen.
IMPRESSION: No acute finding chest or abdomen.

## 2014-10-31 DIAGNOSIS — I10 Essential (primary) hypertension: Secondary | ICD-10-CM | POA: Diagnosis not present

## 2014-10-31 DIAGNOSIS — G35 Multiple sclerosis: Secondary | ICD-10-CM | POA: Diagnosis not present

## 2014-10-31 DIAGNOSIS — R7309 Other abnormal glucose: Secondary | ICD-10-CM | POA: Diagnosis not present

## 2014-10-31 DIAGNOSIS — S1010XA Unspecified superficial injuries of throat, initial encounter: Secondary | ICD-10-CM | POA: Diagnosis not present

## 2014-10-31 DIAGNOSIS — R1310 Dysphagia, unspecified: Secondary | ICD-10-CM | POA: Diagnosis not present

## 2014-10-31 DIAGNOSIS — K760 Fatty (change of) liver, not elsewhere classified: Secondary | ICD-10-CM | POA: Diagnosis not present

## 2014-10-31 DIAGNOSIS — K2270 Barrett's esophagus without dysplasia: Secondary | ICD-10-CM | POA: Diagnosis not present

## 2014-10-31 DIAGNOSIS — Z87820 Personal history of traumatic brain injury: Secondary | ICD-10-CM | POA: Diagnosis not present

## 2014-10-31 DIAGNOSIS — G894 Chronic pain syndrome: Secondary | ICD-10-CM | POA: Diagnosis not present

## 2014-11-03 ENCOUNTER — Ambulatory Visit (INDEPENDENT_AMBULATORY_CARE_PROVIDER_SITE_OTHER): Payer: Medicare Other | Admitting: Family Medicine

## 2014-11-03 ENCOUNTER — Encounter: Payer: Self-pay | Admitting: Family Medicine

## 2014-11-03 VITALS — BP 139/83 | HR 95 | Ht 62.0 in | Wt 195.0 lb

## 2014-11-03 DIAGNOSIS — R55 Syncope and collapse: Secondary | ICD-10-CM

## 2014-11-03 DIAGNOSIS — E876 Hypokalemia: Secondary | ICD-10-CM | POA: Diagnosis not present

## 2014-11-03 DIAGNOSIS — R7301 Impaired fasting glucose: Secondary | ICD-10-CM

## 2014-11-03 DIAGNOSIS — R5383 Other fatigue: Secondary | ICD-10-CM | POA: Diagnosis not present

## 2014-11-03 LAB — POCT GLYCOSYLATED HEMOGLOBIN (HGB A1C): Hemoglobin A1C: 5.9

## 2014-11-03 LAB — GLUCOSE, POCT (MANUAL RESULT ENTRY): POC Glucose: 111 mg/dl — AB (ref 70–99)
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
## 2014-11-04 ENCOUNTER — Telehealth: Payer: Self-pay | Admitting: Family Medicine

## 2014-11-04 ENCOUNTER — Encounter: Payer: Self-pay | Admitting: Family Medicine

## 2014-11-04 LAB — CBC WITH DIFFERENTIAL/PLATELET
Basophils Absolute: 0.1 10*3/uL (ref 0.0–0.1)
Basophils Relative: 1 % (ref 0–1)
Eosinophils Absolute: 0.1 10*3/uL (ref 0.0–0.7)
Eosinophils Relative: 2 % (ref 0–5)
HCT: 41.7 % (ref 36.0–46.0)
Hemoglobin: 13.6 g/dL (ref 12.0–15.0)
Lymphocytes Relative: 29 % (ref 12–46)
Lymphs Abs: 1.5 10*3/uL (ref 0.7–4.0)
MCH: 28 pg (ref 26.0–34.0)
MCHC: 32.6 g/dL (ref 30.0–36.0)
MCV: 85.8 fL (ref 78.0–100.0)
MPV: 10.8 fL (ref 8.6–12.4)
Monocytes Absolute: 0.4 10*3/uL (ref 0.1–1.0)
Monocytes Relative: 8 % (ref 3–12)
Neutro Abs: 3 10*3/uL (ref 1.7–7.7)
Neutrophils Relative %: 60 % (ref 43–77)
Platelets: 211 10*3/uL (ref 150–400)
RBC: 4.86 MIL/uL (ref 3.87–5.11)
RDW: 15.6 % — ABNORMAL HIGH (ref 11.5–15.5)
WBC: 5 10*3/uL (ref 4.0–10.5)

## 2014-11-04 LAB — BASIC METABOLIC PANEL
BUN: 12 mg/dL (ref 6–23)
CO2: 25 mEq/L (ref 19–32)
Calcium: 9.3 mg/dL (ref 8.4–10.5)
Chloride: 106 mEq/L (ref 96–112)
Creat: 0.39 mg/dL — ABNORMAL LOW (ref 0.50–1.10)
Glucose, Bld: 79 mg/dL (ref 70–99)
Potassium: 3.8 mEq/L (ref 3.5–5.3)
Sodium: 140 mEq/L (ref 135–145)

## 2014-11-04 NOTE — Telephone Encounter (Signed)
Patient called to see about results from blood work completed yesterday. Per note on labs, Pt advised "BMP is ok. Blood count is normal."   Pt verbalized understanding stating she feels like she may become dehydrated because she doesn't feel like getting out of the bed. Advised Pt to set a goal for the day to increase her fluid intake. Patient stated she will set this goal, and work hard to increase her fluids today.  No further questions.

## 2014-11-06 DIAGNOSIS — F438 Other reactions to severe stress: Secondary | ICD-10-CM | POA: Diagnosis not present

## 2014-11-06 DIAGNOSIS — Z87820 Personal history of traumatic brain injury: Secondary | ICD-10-CM | POA: Diagnosis not present

## 2014-11-06 DIAGNOSIS — M79602 Pain in left arm: Secondary | ICD-10-CM | POA: Diagnosis not present

## 2014-11-06 DIAGNOSIS — G35 Multiple sclerosis: Secondary | ICD-10-CM | POA: Diagnosis not present

## 2014-11-06 DIAGNOSIS — I10 Essential (primary) hypertension: Secondary | ICD-10-CM | POA: Diagnosis not present

## 2014-11-06 DIAGNOSIS — R457 State of emotional shock and stress, unspecified: Secondary | ICD-10-CM | POA: Diagnosis not present

## 2014-11-06 DIAGNOSIS — R7309 Other abnormal glucose: Secondary | ICD-10-CM | POA: Diagnosis not present

## 2014-11-06 DIAGNOSIS — G8929 Other chronic pain: Secondary | ICD-10-CM | POA: Diagnosis not present

## 2014-11-06 DIAGNOSIS — R072 Precordial pain: Secondary | ICD-10-CM | POA: Diagnosis not present

## 2014-11-07 DIAGNOSIS — G8929 Other chronic pain: Secondary | ICD-10-CM | POA: Diagnosis not present

## 2014-11-07 DIAGNOSIS — G35 Multiple sclerosis: Secondary | ICD-10-CM | POA: Diagnosis not present

## 2014-11-07 DIAGNOSIS — Z87820 Personal history of traumatic brain injury: Secondary | ICD-10-CM | POA: Diagnosis not present

## 2014-11-07 DIAGNOSIS — R0602 Shortness of breath: Secondary | ICD-10-CM | POA: Diagnosis not present

## 2014-11-07 DIAGNOSIS — R221 Localized swelling, mass and lump, neck: Secondary | ICD-10-CM | POA: Diagnosis not present

## 2014-11-07 DIAGNOSIS — I10 Essential (primary) hypertension: Secondary | ICD-10-CM | POA: Diagnosis not present

## 2014-11-07 DIAGNOSIS — R7309 Other abnormal glucose: Secondary | ICD-10-CM | POA: Diagnosis not present

## 2014-11-07 DIAGNOSIS — K760 Fatty (change of) liver, not elsewhere classified: Secondary | ICD-10-CM | POA: Diagnosis not present

## 2014-11-07 DIAGNOSIS — Z711 Person with feared health complaint in whom no diagnosis is made: Secondary | ICD-10-CM | POA: Diagnosis not present

## 2014-11-08 ENCOUNTER — Encounter (HOSPITAL_BASED_OUTPATIENT_CLINIC_OR_DEPARTMENT_OTHER): Payer: Self-pay

## 2014-11-08 ENCOUNTER — Emergency Department (HOSPITAL_BASED_OUTPATIENT_CLINIC_OR_DEPARTMENT_OTHER)
Admission: EM | Admit: 2014-11-08 | Discharge: 2014-11-08 | Disposition: A | Discharge status: Home / Self Care | Payer: Medicare Other | Attending: Emergency Medicine | Admitting: Emergency Medicine

## 2014-11-08 ENCOUNTER — Other Ambulatory Visit: Payer: Self-pay

## 2014-11-08 DIAGNOSIS — Z87891 Personal history of nicotine dependence: Secondary | ICD-10-CM | POA: Insufficient documentation

## 2014-11-08 DIAGNOSIS — J3089 Other allergic rhinitis: Secondary | ICD-10-CM

## 2014-11-08 DIAGNOSIS — G8929 Other chronic pain: Secondary | ICD-10-CM | POA: Diagnosis not present

## 2014-11-08 DIAGNOSIS — Z8614 Personal history of Methicillin resistant Staphylococcus aureus infection: Secondary | ICD-10-CM | POA: Insufficient documentation

## 2014-11-08 DIAGNOSIS — Z7952 Long term (current) use of systemic steroids: Secondary | ICD-10-CM | POA: Insufficient documentation

## 2014-11-08 DIAGNOSIS — J45909 Unspecified asthma, uncomplicated: Secondary | ICD-10-CM | POA: Diagnosis not present

## 2014-11-08 DIAGNOSIS — K219 Gastro-esophageal reflux disease without esophagitis: Secondary | ICD-10-CM | POA: Insufficient documentation

## 2014-11-08 DIAGNOSIS — Z792 Long term (current) use of antibiotics: Secondary | ICD-10-CM | POA: Diagnosis not present

## 2014-11-08 DIAGNOSIS — Z8669 Personal history of other diseases of the nervous system and sense organs: Secondary | ICD-10-CM | POA: Diagnosis not present

## 2014-11-08 DIAGNOSIS — R05 Cough: Secondary | ICD-10-CM | POA: Diagnosis present

## 2014-11-08 DIAGNOSIS — J302 Other seasonal allergic rhinitis: Secondary | ICD-10-CM | POA: Diagnosis not present

## 2014-11-08 DIAGNOSIS — Z8659 Personal history of other mental and behavioral disorders: Secondary | ICD-10-CM | POA: Diagnosis not present

## 2014-11-08 DIAGNOSIS — Z79899 Other long term (current) drug therapy: Secondary | ICD-10-CM | POA: Diagnosis not present

## 2014-11-08 DIAGNOSIS — Z880 Allergy status to penicillin: Secondary | ICD-10-CM | POA: Insufficient documentation

## 2014-11-08 DIAGNOSIS — Z85828 Personal history of other malignant neoplasm of skin: Secondary | ICD-10-CM | POA: Insufficient documentation

## 2014-11-08 DIAGNOSIS — Z8542 Personal history of malignant neoplasm of other parts of uterus: Secondary | ICD-10-CM | POA: Diagnosis not present

## 2014-11-08 DIAGNOSIS — Z862 Personal history of diseases of the blood and blood-forming organs and certain disorders involving the immune mechanism: Secondary | ICD-10-CM | POA: Insufficient documentation

## 2014-11-08 DIAGNOSIS — I10 Essential (primary) hypertension: Secondary | ICD-10-CM | POA: Insufficient documentation

## 2014-11-08 DIAGNOSIS — R079 Chest pain, unspecified: Secondary | ICD-10-CM | POA: Insufficient documentation

## 2014-11-08 MED ORDER — ALBUTEROL SULFATE (2.5 MG/3ML) 0.083% IN NEBU: 5.0000 mg | INHALATION_SOLUTION | Freq: Once | RESPIRATORY_TRACT | Status: DC

## 2014-11-08 MED ORDER — DEXAMETHASONE SODIUM PHOSPHATE 10 MG/ML IJ SOLN
4.0000 mg | Freq: Once | INTRAMUSCULAR | Status: DC
  Filled 2014-11-08: qty 1

## 2014-11-08 MED ORDER — SODIUM CHLORIDE 0.9 % IN NEBU
INHALATION_SOLUTION | RESPIRATORY_TRACT | Status: AC
  Filled 2014-11-08: qty 3

## 2014-11-08 MED ORDER — DIPHENHYDRAMINE HCL 25 MG PO CAPS
25.0000 mg | ORAL_CAPSULE | Freq: Once | ORAL | Status: AC
  Administered 2014-11-08: 25 mg via ORAL
  Filled 2014-11-08: qty 1

## 2014-11-08 MED ORDER — LEVALBUTEROL HCL 1.25 MG/0.5ML IN NEBU
1.2500 mg | INHALATION_SOLUTION | Freq: Once | RESPIRATORY_TRACT | Status: AC
  Administered 2014-11-08: 1.25 mg via RESPIRATORY_TRACT
  Filled 2014-11-08: qty 0.5

## 2014-11-08 MED ORDER — FAMOTIDINE 20 MG PO TABS
20.0000 mg | ORAL_TABLET | Freq: Once | ORAL | Status: AC
  Administered 2014-11-08: 20 mg via ORAL
  Filled 2014-11-08: qty 1

## 2014-11-08 NOTE — Discharge Instructions (Signed)
Allergies °Allergies may happen from anything your body is sensitive to. This may be food, medicines, pollens, chemicals, and nearly anything around you in everyday life that produces allergens. An allergen is anything that causes an allergy producing substance. Heredity is often a factor in causing these problems. This means you may have some of the same allergies as your parents. °Food allergies happen in all age groups. Food allergies are some of the most severe and life threatening. Some common food allergies are cow's milk, seafood, eggs, nuts, wheat, and soybeans. °SYMPTOMS  °· Swelling around the mouth. °· An itchy red rash or hives. °· Vomiting or diarrhea. °· Difficulty breathing. °SEVERE ALLERGIC REACTIONS ARE LIFE-THREATENING. °This reaction is called anaphylaxis. It can cause the mouth and throat to swell and cause difficulty with breathing and swallowing. In severe reactions only a trace amount of food (for example, peanut oil in a salad) may cause death within seconds. °Seasonal allergies occur in all age groups. These are seasonal because they usually occur during the same season every year. They may be a reaction to molds, grass pollens, or tree pollens. Other causes of problems are house dust mite allergens, pet dander, and mold spores. The symptoms often consist of nasal congestion, a runny itchy nose associated with sneezing, and tearing itchy eyes. There is often an associated itching of the mouth and ears. The problems happen when you come in contact with pollens and other allergens. Allergens are the particles in the air that the body reacts to with an allergic reaction. This causes you to release allergic antibodies. Through a chain of events, these eventually cause you to release histamine into the blood stream. Although it is meant to be protective to the body, it is this release that causes your discomfort. This is why you were given anti-histamines to feel better.  If you are unable to  pinpoint the offending allergen, it may be determined by skin or blood testing. Allergies cannot be cured but can be controlled with medicine. °Hay fever is a collection of all or some of the seasonal allergy problems. It may often be treated with simple over-the-counter medicine such as diphenhydramine. Take medicine as directed. Do not drink alcohol or drive while taking this medicine. Check with your caregiver or package insert for child dosages. °If these medicines are not effective, there are many new medicines your caregiver can prescribe. Stronger medicine such as nasal spray, eye drops, and corticosteroids may be used if the first things you try do not work well. Other treatments such as immunotherapy or desensitizing injections can be used if all else fails. Follow up with your caregiver if problems continue. These seasonal allergies are usually not life threatening. They are generally more of a nuisance that can often be handled using medicine. °HOME CARE INSTRUCTIONS  °· If unsure what causes a reaction, keep a diary of foods eaten and symptoms that follow. Avoid foods that cause reactions. °· If hives or rash are present: °¨ Take medicine as directed. °¨ You may use an over-the-counter antihistamine (diphenhydramine) for hives and itching as needed. °¨ Apply cold compresses (cloths) to the skin or take baths in cool water. Avoid hot baths or showers. Heat will make a rash and itching worse. °· If you are severely allergic: °¨ Following a treatment for a severe reaction, hospitalization is often required for closer follow-up. °¨ Wear a medic-alert bracelet or necklace stating the allergy. °¨ You and your family must learn how to give adrenaline or use   an anaphylaxis kit. °¨ If you have had a severe reaction, always carry your anaphylaxis kit or EpiPen® with you. Use this medicine as directed by your caregiver if a severe reaction is occurring. Failure to do so could have a fatal outcome. °SEEK MEDICAL  CARE IF: °· You suspect a food allergy. Symptoms generally happen within 30 minutes of eating a food. °· Your symptoms have not gone away within 2 days or are getting worse. °· You develop new symptoms. °· You want to retest yourself or your child with a food or drink you think causes an allergic reaction. Never do this if an anaphylactic reaction to that food or drink has happened before. Only do this under the care of a caregiver. °SEEK IMMEDIATE MEDICAL CARE IF:  °· You have difficulty breathing, are wheezing, or have a tight feeling in your chest or throat. °· You have a swollen mouth, or you have hives, swelling, or itching all over your body. °· You have had a severe reaction that has responded to your anaphylaxis kit or an EpiPen®. These reactions may return when the medicine has worn off. These reactions should be considered life threatening. °MAKE SURE YOU:  °· Understand these instructions. °· Will watch your condition. °· Will get help right away if you are not doing well or get worse. °Document Released: 10/24/2002 Document Revised: 11/25/2012 Document Reviewed: 03/30/2008 °[employer]® Patient Information ©2015 [employer]. This information is not intended to replace advice given to you by your health care provider. Make sure you discuss any questions you have with your health care provider. ° °

## 2014-11-08 NOTE — ED Notes (Signed)
Pt reports cough, shortness of breath, chest discomfort, sore throat, vomiting, since last night.
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
## 2014-11-09 DIAGNOSIS — J019 Acute sinusitis, unspecified: Secondary | ICD-10-CM | POA: Diagnosis not present

## 2014-11-09 DIAGNOSIS — J4520 Mild intermittent asthma, uncomplicated: Secondary | ICD-10-CM | POA: Diagnosis not present

## 2014-11-09 DIAGNOSIS — R062 Wheezing: Secondary | ICD-10-CM | POA: Diagnosis not present

## 2014-11-10 DIAGNOSIS — J302 Other seasonal allergic rhinitis: Secondary | ICD-10-CM | POA: Diagnosis not present

## 2014-11-10 DIAGNOSIS — E785 Hyperlipidemia, unspecified: Secondary | ICD-10-CM | POA: Diagnosis not present

## 2014-11-10 DIAGNOSIS — G43909 Migraine, unspecified, not intractable, without status migrainosus: Secondary | ICD-10-CM | POA: Diagnosis not present

## 2014-11-10 DIAGNOSIS — Z880 Allergy status to penicillin: Secondary | ICD-10-CM | POA: Diagnosis not present

## 2014-11-10 DIAGNOSIS — Z885 Allergy status to narcotic agent status: Secondary | ICD-10-CM | POA: Diagnosis not present

## 2014-11-10 DIAGNOSIS — Z882 Allergy status to sulfonamides status: Secondary | ICD-10-CM | POA: Diagnosis not present

## 2014-11-10 DIAGNOSIS — J040 Acute laryngitis: Secondary | ICD-10-CM | POA: Diagnosis not present

## 2014-11-10 DIAGNOSIS — J45909 Unspecified asthma, uncomplicated: Secondary | ICD-10-CM | POA: Diagnosis not present

## 2014-11-10 DIAGNOSIS — Z888 Allergy status to other drugs, medicaments and biological substances status: Secondary | ICD-10-CM | POA: Diagnosis not present

## 2014-11-10 DIAGNOSIS — E269 Hyperaldosteronism, unspecified: Secondary | ICD-10-CM | POA: Diagnosis not present

## 2014-11-10 DIAGNOSIS — D509 Iron deficiency anemia, unspecified: Secondary | ICD-10-CM | POA: Diagnosis not present

## 2014-11-10 DIAGNOSIS — R221 Localized swelling, mass and lump, neck: Secondary | ICD-10-CM | POA: Diagnosis not present

## 2014-11-10 DIAGNOSIS — Z87891 Personal history of nicotine dependence: Secondary | ICD-10-CM | POA: Diagnosis not present

## 2014-11-10 DIAGNOSIS — Z79899 Other long term (current) drug therapy: Secondary | ICD-10-CM | POA: Diagnosis not present

## 2014-11-10 DIAGNOSIS — I10 Essential (primary) hypertension: Secondary | ICD-10-CM | POA: Diagnosis not present

## 2014-11-10 DIAGNOSIS — K219 Gastro-esophageal reflux disease without esophagitis: Secondary | ICD-10-CM | POA: Diagnosis not present

## 2014-11-10 DIAGNOSIS — Z8542 Personal history of malignant neoplasm of other parts of uterus: Secondary | ICD-10-CM | POA: Diagnosis not present

## 2014-11-10 DIAGNOSIS — G35 Multiple sclerosis: Secondary | ICD-10-CM | POA: Diagnosis not present

## 2014-11-10 DIAGNOSIS — F419 Anxiety disorder, unspecified: Secondary | ICD-10-CM | POA: Diagnosis not present

## 2014-11-10 DIAGNOSIS — R05 Cough: Secondary | ICD-10-CM | POA: Diagnosis not present

## 2014-11-10 DIAGNOSIS — K2270 Barrett's esophagus without dysplasia: Secondary | ICD-10-CM | POA: Diagnosis not present

## 2014-11-10 DIAGNOSIS — R7309 Other abnormal glucose: Secondary | ICD-10-CM | POA: Diagnosis not present

## 2014-11-10 DIAGNOSIS — Z881 Allergy status to other antibiotic agents status: Secondary | ICD-10-CM | POA: Diagnosis not present

## 2014-11-12 ENCOUNTER — Ambulatory Visit: Payer: Medicare Other | Admitting: Family Medicine

## 2014-11-12 DIAGNOSIS — R109 Unspecified abdominal pain: Secondary | ICD-10-CM | POA: Diagnosis not present

## 2014-11-12 DIAGNOSIS — K5790 Diverticulosis of intestine, part unspecified, without perforation or abscess without bleeding: Secondary | ICD-10-CM | POA: Diagnosis not present

## 2014-11-12 DIAGNOSIS — J45909 Unspecified asthma, uncomplicated: Secondary | ICD-10-CM | POA: Diagnosis not present

## 2014-11-12 DIAGNOSIS — E269 Hyperaldosteronism, unspecified: Secondary | ICD-10-CM | POA: Diagnosis not present

## 2014-11-12 DIAGNOSIS — R05 Cough: Secondary | ICD-10-CM | POA: Diagnosis not present

## 2014-11-12 DIAGNOSIS — D649 Anemia, unspecified: Secondary | ICD-10-CM | POA: Diagnosis not present

## 2014-11-12 DIAGNOSIS — G43909 Migraine, unspecified, not intractable, without status migrainosus: Secondary | ICD-10-CM | POA: Diagnosis not present

## 2014-11-12 DIAGNOSIS — K760 Fatty (change of) liver, not elsewhere classified: Secondary | ICD-10-CM | POA: Diagnosis not present

## 2014-11-12 DIAGNOSIS — M7590 Shoulder lesion, unspecified, unspecified shoulder: Secondary | ICD-10-CM | POA: Diagnosis not present

## 2014-11-12 DIAGNOSIS — I10 Essential (primary) hypertension: Secondary | ICD-10-CM | POA: Diagnosis not present

## 2014-11-12 DIAGNOSIS — M19019 Primary osteoarthritis, unspecified shoulder: Secondary | ICD-10-CM | POA: Diagnosis not present

## 2014-11-12 DIAGNOSIS — J060 Acute laryngopharyngitis: Secondary | ICD-10-CM | POA: Diagnosis not present

## 2014-11-12 DIAGNOSIS — K929 Disease of digestive system, unspecified: Secondary | ICD-10-CM | POA: Diagnosis not present

## 2014-11-12 DIAGNOSIS — R000 Tachycardia, unspecified: Secondary | ICD-10-CM | POA: Diagnosis not present

## 2014-11-12 DIAGNOSIS — F459 Somatoform disorder, unspecified: Secondary | ICD-10-CM | POA: Diagnosis not present

## 2014-11-12 DIAGNOSIS — K648 Other hemorrhoids: Secondary | ICD-10-CM | POA: Diagnosis not present

## 2014-11-12 DIAGNOSIS — I471 Supraventricular tachycardia: Secondary | ICD-10-CM | POA: Diagnosis not present

## 2014-11-12 DIAGNOSIS — C519 Malignant neoplasm of vulva, unspecified: Secondary | ICD-10-CM | POA: Diagnosis not present

## 2014-11-12 DIAGNOSIS — G4730 Sleep apnea, unspecified: Secondary | ICD-10-CM | POA: Diagnosis not present

## 2014-11-12 DIAGNOSIS — K2270 Barrett's esophagus without dysplasia: Secondary | ICD-10-CM | POA: Diagnosis not present

## 2014-11-12 DIAGNOSIS — J989 Respiratory disorder, unspecified: Secondary | ICD-10-CM | POA: Diagnosis not present

## 2014-11-12 DIAGNOSIS — J40 Bronchitis, not specified as acute or chronic: Secondary | ICD-10-CM | POA: Diagnosis not present

## 2014-11-12 DIAGNOSIS — G894 Chronic pain syndrome: Secondary | ICD-10-CM | POA: Diagnosis not present

## 2014-11-12 DIAGNOSIS — M7550 Bursitis of unspecified shoulder: Secondary | ICD-10-CM | POA: Diagnosis not present

## 2014-11-12 DIAGNOSIS — K219 Gastro-esophageal reflux disease without esophagitis: Secondary | ICD-10-CM | POA: Diagnosis not present

## 2014-11-12 DIAGNOSIS — R569 Unspecified convulsions: Secondary | ICD-10-CM | POA: Diagnosis not present

## 2014-11-12 LAB — BASIC METABOLIC PANEL
BUN: 14 mg/dL (ref 4–21)
Creatinine: 0.4 mg/dL — AB (ref 0.5–1.1)
Glucose: 111 mg/dL
Potassium: 3.9 mmol/L (ref 3.4–5.3)
Sodium: 145 mmol/L (ref 137–147)

## 2014-11-12 LAB — CMP14+LP+1AC+CBC/D/PLT+TSH
Anion gap: 12
Calcium: 8.9 mg/dL
Chloride: 110 mmol/L

## 2014-11-13 ENCOUNTER — Telehealth: Payer: Self-pay | Admitting: Family Medicine

## 2014-11-13 NOTE — Telephone Encounter (Signed)
Call patient: Got her BMP results back from yesterday. Potassium looks okay but on the lower end of normal. Just make sure taking potassium supplement daily.

## 2014-11-13 NOTE — Telephone Encounter (Signed)
#   is not in service. Tried several times and got the same message,. Will try again later.Jennifer [name] [place]

## 2014-11-16 ENCOUNTER — Ambulatory Visit (INDEPENDENT_AMBULATORY_CARE_PROVIDER_SITE_OTHER): Payer: Medicare Other | Admitting: Family Medicine

## 2014-11-16 ENCOUNTER — Encounter: Payer: Self-pay | Admitting: Family Medicine

## 2014-11-16 VITALS — BP 137/89 | HR 95 | Temp 98.3°F | Resp 18 | Ht 62.0 in | Wt 194.0 lb

## 2014-11-16 DIAGNOSIS — E876 Hypokalemia: Secondary | ICD-10-CM | POA: Diagnosis not present

## 2014-11-16 DIAGNOSIS — R05 Cough: Secondary | ICD-10-CM | POA: Diagnosis not present

## 2014-11-16 DIAGNOSIS — R109 Unspecified abdominal pain: Secondary | ICD-10-CM | POA: Diagnosis not present

## 2014-11-16 DIAGNOSIS — Z8719 Personal history of other diseases of the digestive system: Secondary | ICD-10-CM | POA: Diagnosis not present

## 2014-11-16 DIAGNOSIS — G894 Chronic pain syndrome: Secondary | ICD-10-CM | POA: Diagnosis not present

## 2014-11-16 DIAGNOSIS — K760 Fatty (change of) liver, not elsewhere classified: Secondary | ICD-10-CM | POA: Diagnosis not present

## 2014-11-16 DIAGNOSIS — I471 Supraventricular tachycardia: Secondary | ICD-10-CM | POA: Diagnosis not present

## 2014-11-16 DIAGNOSIS — E269 Hyperaldosteronism, unspecified: Secondary | ICD-10-CM | POA: Diagnosis not present

## 2014-11-16 DIAGNOSIS — R079 Chest pain, unspecified: Secondary | ICD-10-CM | POA: Diagnosis not present

## 2014-11-16 DIAGNOSIS — R569 Unspecified convulsions: Secondary | ICD-10-CM | POA: Diagnosis not present

## 2014-11-16 DIAGNOSIS — Z87442 Personal history of urinary calculi: Secondary | ICD-10-CM | POA: Diagnosis not present

## 2014-11-16 DIAGNOSIS — K219 Gastro-esophageal reflux disease without esophagitis: Secondary | ICD-10-CM | POA: Diagnosis not present

## 2014-11-16 DIAGNOSIS — R0602 Shortness of breath: Secondary | ICD-10-CM | POA: Diagnosis not present

## 2014-11-16 DIAGNOSIS — R490 Dysphonia: Secondary | ICD-10-CM | POA: Diagnosis not present

## 2014-11-16 DIAGNOSIS — F459 Somatoform disorder, unspecified: Secondary | ICD-10-CM | POA: Diagnosis not present

## 2014-11-16 DIAGNOSIS — J309 Allergic rhinitis, unspecified: Secondary | ICD-10-CM

## 2014-11-16 DIAGNOSIS — G35 Multiple sclerosis: Secondary | ICD-10-CM | POA: Diagnosis not present

## 2014-11-16 DIAGNOSIS — K2270 Barrett's esophagus without dysplasia: Secondary | ICD-10-CM | POA: Diagnosis not present

## 2014-11-16 DIAGNOSIS — I10 Essential (primary) hypertension: Secondary | ICD-10-CM | POA: Diagnosis not present

## 2014-11-16 DIAGNOSIS — E162 Hypoglycemia, unspecified: Secondary | ICD-10-CM | POA: Diagnosis not present

## 2014-11-16 DIAGNOSIS — G4730 Sleep apnea, unspecified: Secondary | ICD-10-CM | POA: Diagnosis not present

## 2014-11-16 DIAGNOSIS — J45909 Unspecified asthma, uncomplicated: Secondary | ICD-10-CM | POA: Diagnosis not present

## 2014-11-16 DIAGNOSIS — Z8542 Personal history of malignant neoplasm of other parts of uterus: Secondary | ICD-10-CM | POA: Diagnosis not present

## 2014-11-16 DIAGNOSIS — Z8673 Personal history of transient ischemic attack (TIA), and cerebral infarction without residual deficits: Secondary | ICD-10-CM | POA: Diagnosis not present

## 2014-11-16 DIAGNOSIS — J40 Bronchitis, not specified as acute or chronic: Secondary | ICD-10-CM | POA: Diagnosis not present

## 2014-11-16 DIAGNOSIS — D649 Anemia, unspecified: Secondary | ICD-10-CM | POA: Diagnosis not present

## 2014-11-16 DIAGNOSIS — Z79899 Other long term (current) drug therapy: Secondary | ICD-10-CM | POA: Diagnosis not present

## 2014-11-16 MED ORDER — FEXOFENADINE HCL 180 MG PO TABS: 180.0000 mg | ORAL_TABLET | Freq: Every day | ORAL | Status: DC

## 2014-11-16 NOTE — Patient Instructions (Signed)
Increase ranitidine to twice a day for about a week.

## 2014-11-16 NOTE — Progress Notes (Signed)
   Subjective:    Patient ID: Jennifer [name], female    DOB: 02/22/1966, 49 y.o.   MRN: [id number]  HPI Martin Majestic to the ED for SOB, raspy voice.  After cleaned her car and was cleaning the pollen off her porch and felt like her throat was "closing up" and started coughing uup chunks of food.  Says then felt like was losing her voice. Deep breaths trigger coughing.  Has been on benadryl, qnasl and nasal rinse and zinc lozenges and mucinex in the AM.  No fever, chills or sweats. She is very worried that she could have a pulmonary embolism because of the shortness of breath. She also wonders if she would benefit from being on an antibiotic. She does have a history of vocal cord dysfunction.   Review of Systems     Objective:   Physical Exam  Constitutional: She is oriented to person, place, and time. She appears well-developed and well-nourished.  HENT:  Head: Normocephalic and atraumatic.  Right Ear: External ear normal.  Left Ear: External ear normal.  Nose: Nose normal.  Mouth/Throat: Oropharynx is clear and moist.  TMs and canals are clear.   Eyes: Conjunctivae and EOM are normal. Pupils are equal, round, and reactive to light.  Neck: Neck supple. No thyromegaly present.  Cardiovascular: Normal rate, regular rhythm and normal heart sounds.   Pulmonary/Chest: Effort normal and breath sounds normal. She has no wheezes.  Lymphadenopathy:    She has no cervical adenopathy.  Neurological: She is alert and oriented to person, place, and time.  Skin: Skin is warm and dry.  Psychiatric: She has a normal mood and affect.          Assessment & Plan:  SOB- I think related to allergies. Lung exam is clear and pulse ox is normal. Gave her reassurance. Throat looks normla. Does have some cervical LN. Reassured her she doesn't need a d-dimer. She has a choronically elevate elvel that makes it confusing too.   AR- recommend try allegra. Says claritin only works for 2 hours.  I think this would  provide more significant relief than just when necessary Benadryl. Continue with nasal steroid spray and saline.   Hoarseness- I think the hoarseness is most likely either allergy related or possibly due to some irritation of the vocal cords after vomiting. I can reassured her that this should have nothing to do with an actual tear or damage to the structure the vocal cords but certainly could be some mild inflammation.

## 2014-11-17 ENCOUNTER — Encounter: Payer: Self-pay | Admitting: Family Medicine

## 2014-11-17 IMAGING — CR DG CHEST 2V
2 series · 2 of 2 positions shown · non-contrast
Comparison: 08/06/2013

CLINICAL DATA: Right leg pain

EXAM:
CHEST  2 VIEW

[w chest pa]
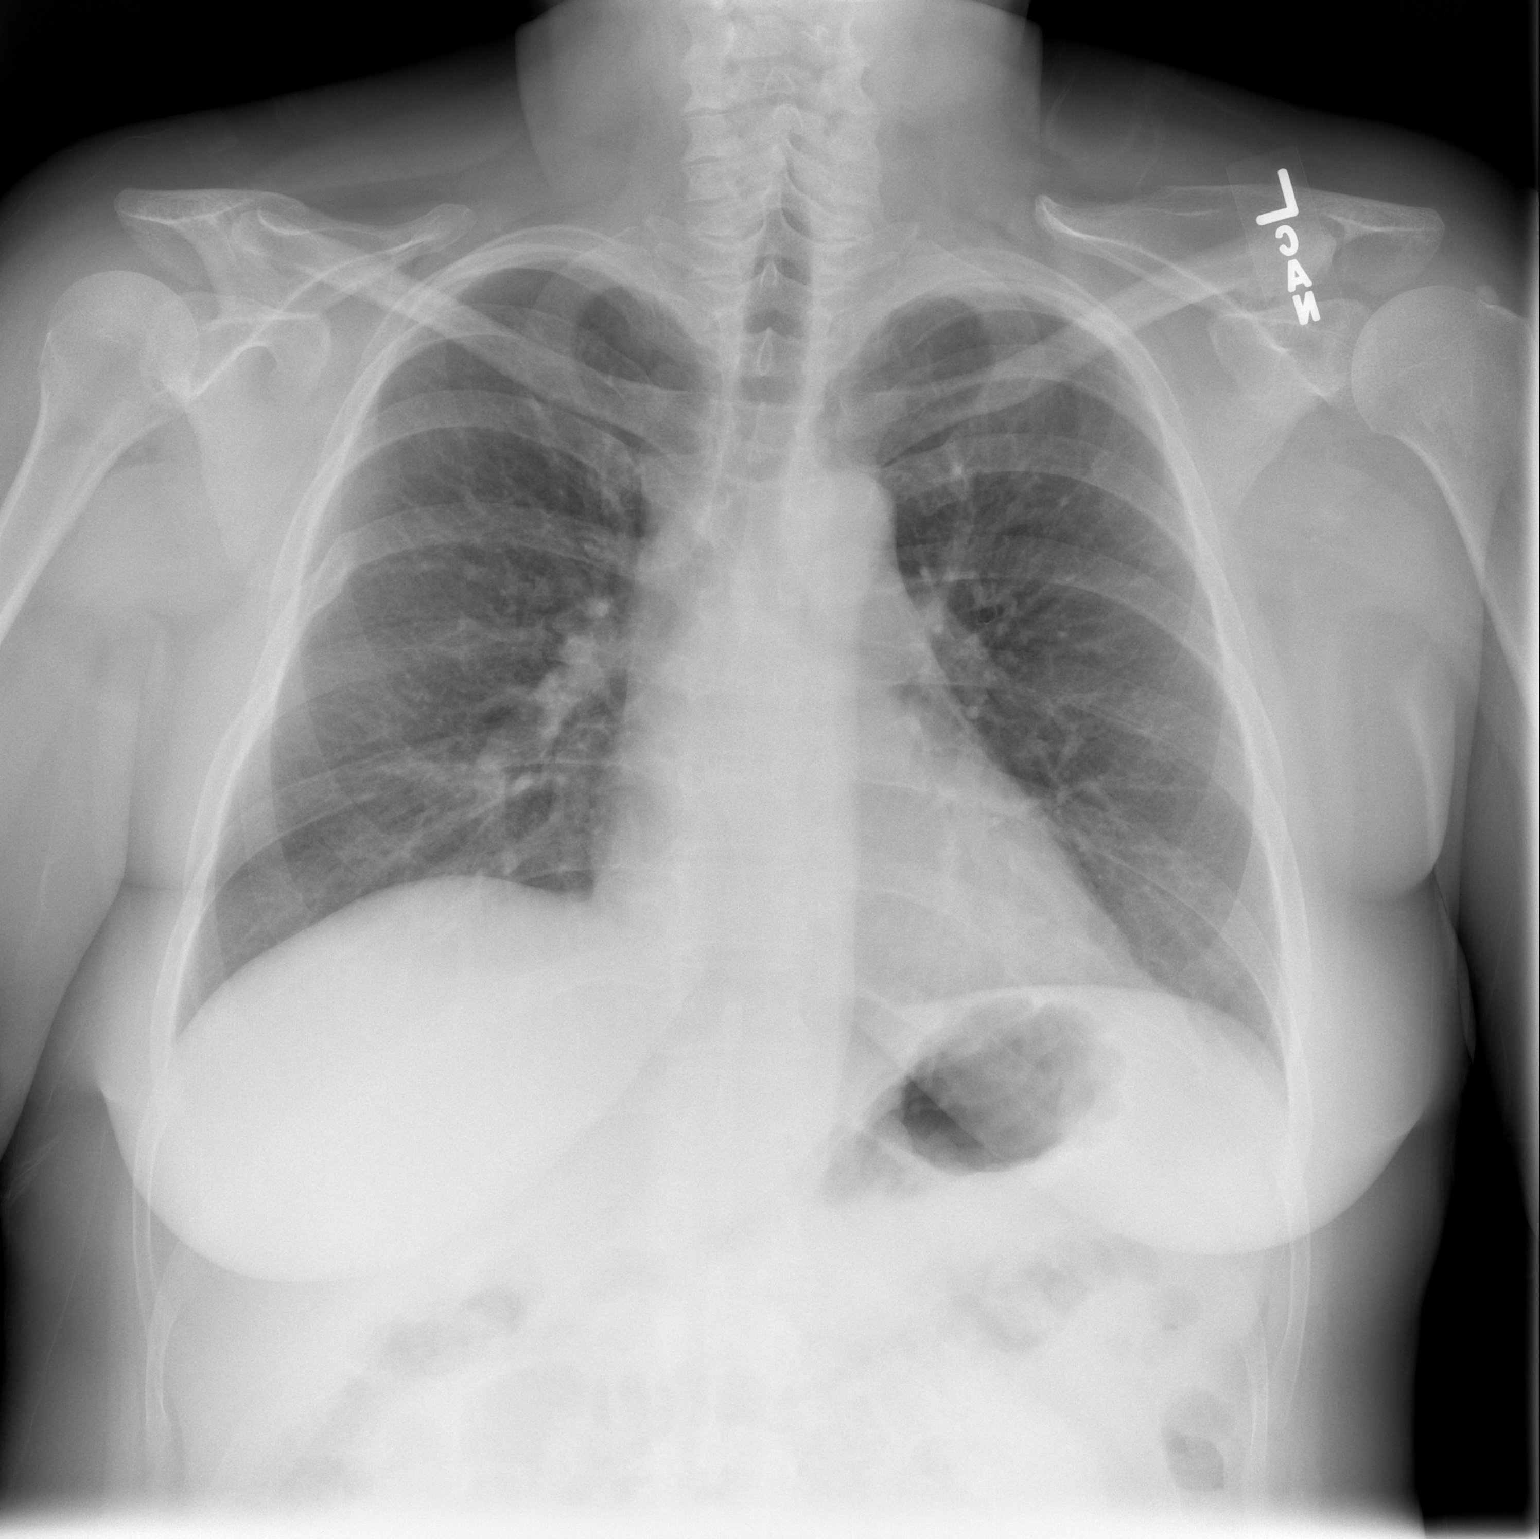

[w chest lat]
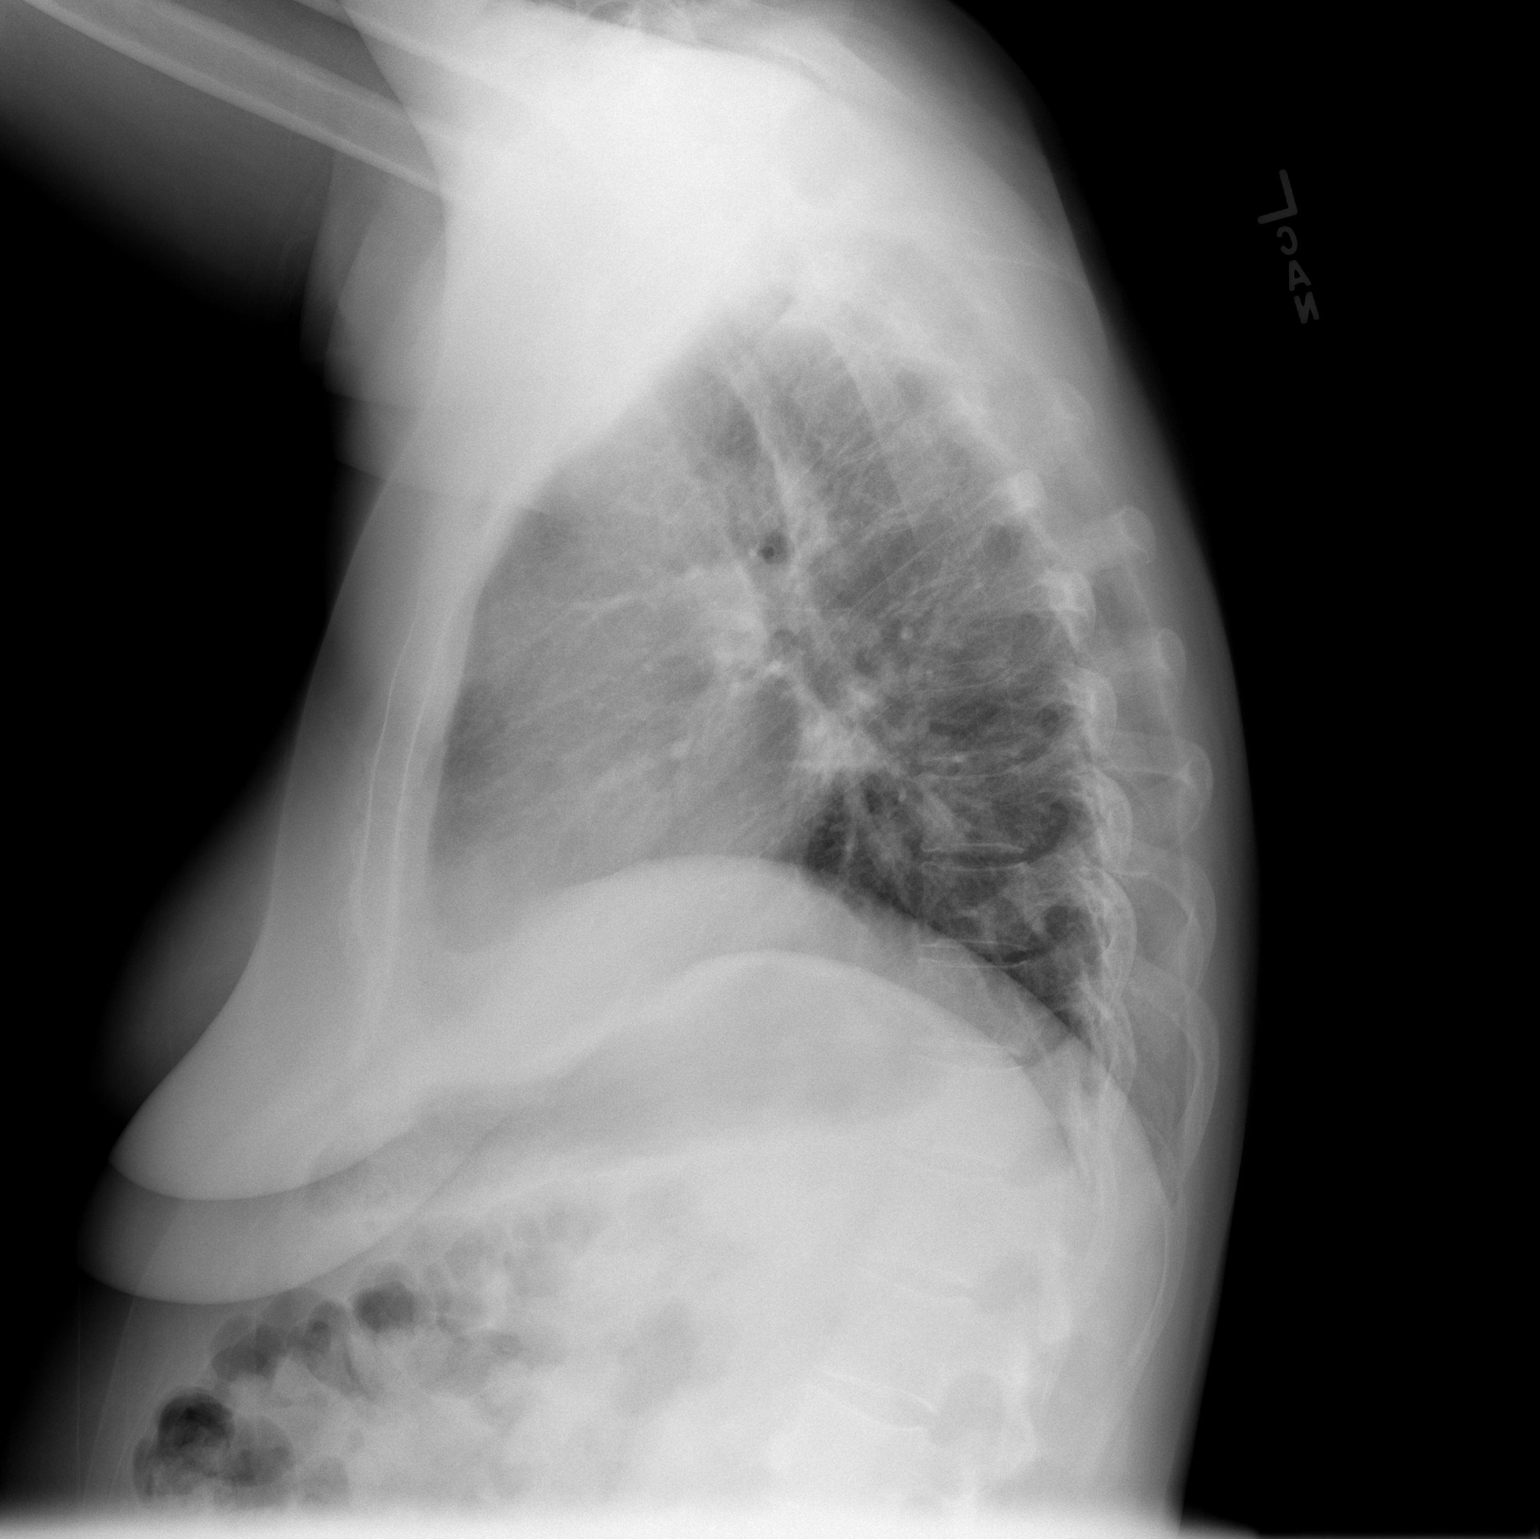

[2 of 2 positions shown; findings below may reference images not displayed]

FINDINGS: Cardiomediastinal silhouette is stable. Old right rib fracture again
noted. No acute infiltrate or pleural effusion. No pulmonary edema.
IMPRESSION: No active cardiopulmonary disease.

## 2014-11-18 DIAGNOSIS — R490 Dysphonia: Secondary | ICD-10-CM | POA: Diagnosis not present

## 2014-11-18 DIAGNOSIS — J387 Other diseases of larynx: Secondary | ICD-10-CM | POA: Diagnosis not present

## 2014-11-18 DIAGNOSIS — J309 Allergic rhinitis, unspecified: Secondary | ICD-10-CM | POA: Diagnosis not present

## 2014-11-18 IMAGING — US US EXTREM LOW VENOUS*R*
1 series · 14 of 20 positions shown · non-contrast
Comparison: 09/12/2012

CLINICAL DATA: Right hip and thigh pain. History of uterine cancer.

EXAM:
Right LOWER EXTREMITY VENOUS DOPPLER ULTRASOUND
TECHNIQUE: Gray-scale sonography with graded compression, as well as color
Doppler and duplex ultrasound, were performed to evaluate the deep
venous system from the level of the common femoral vein through the
popliteal and proximal calf veins. Spectral Doppler was utilized to
evaluate flow at rest and with distal augmentation maneuvers.

[Series 1: us extrem low venous*right* · 14 of 20 slices shown]
[im 1/20]
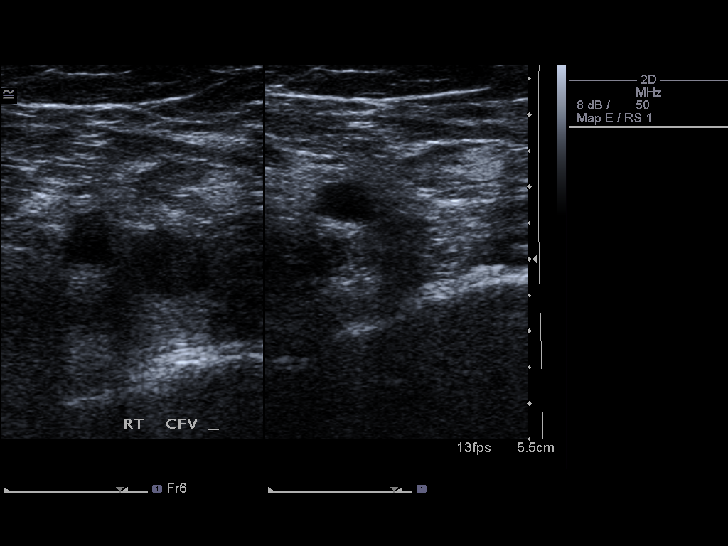
[im 3/20]
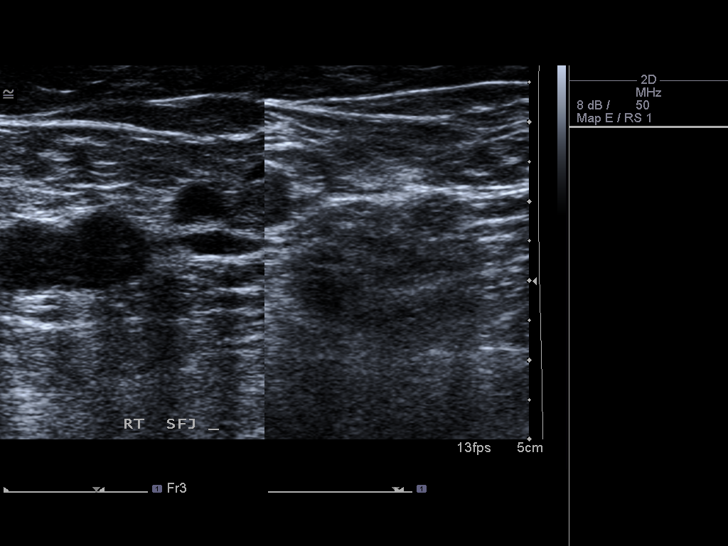
[im 4/20]
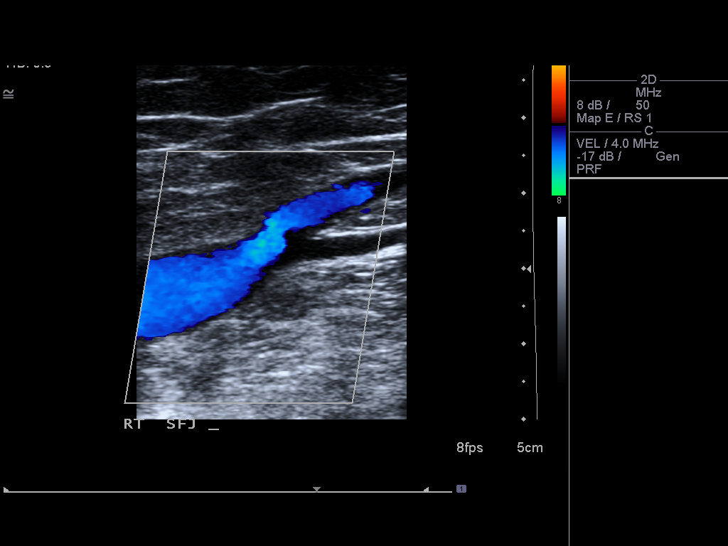
[im 6/20]
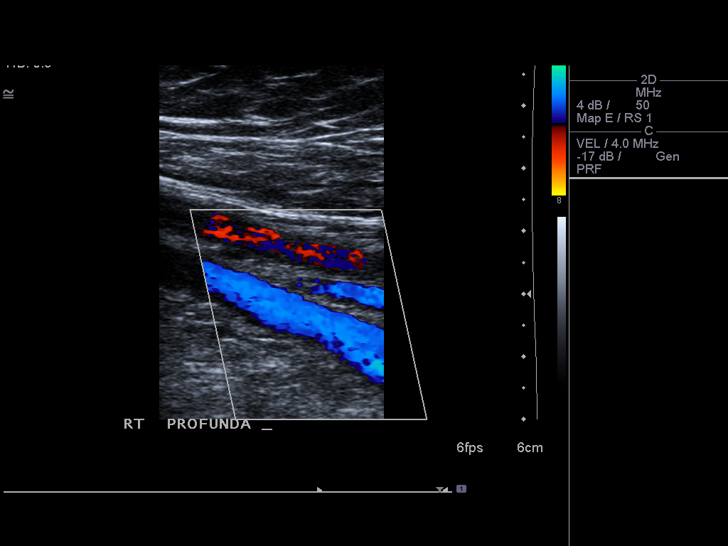
[im 7/20]
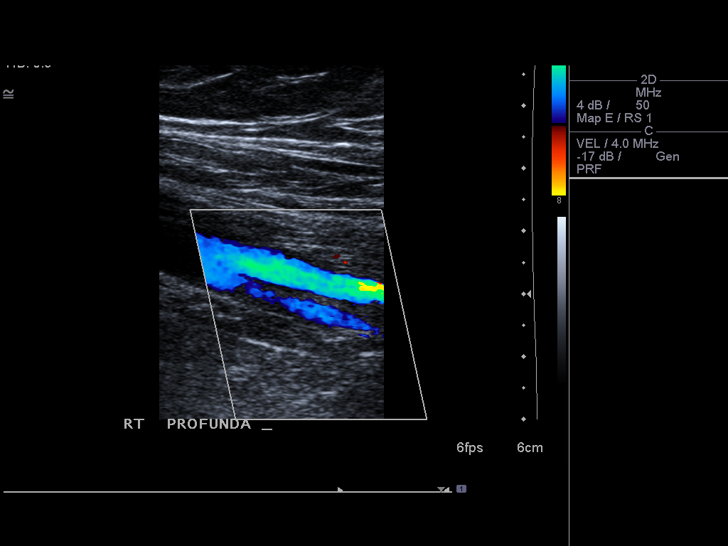
[im 8/20]
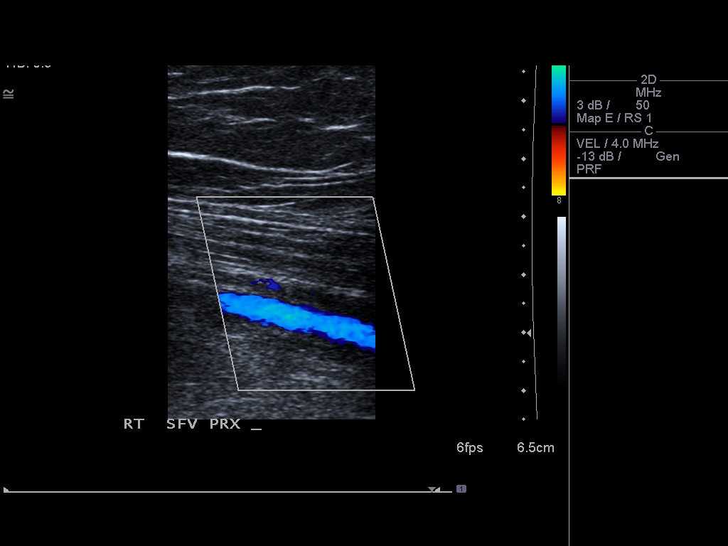
[im 10/20]
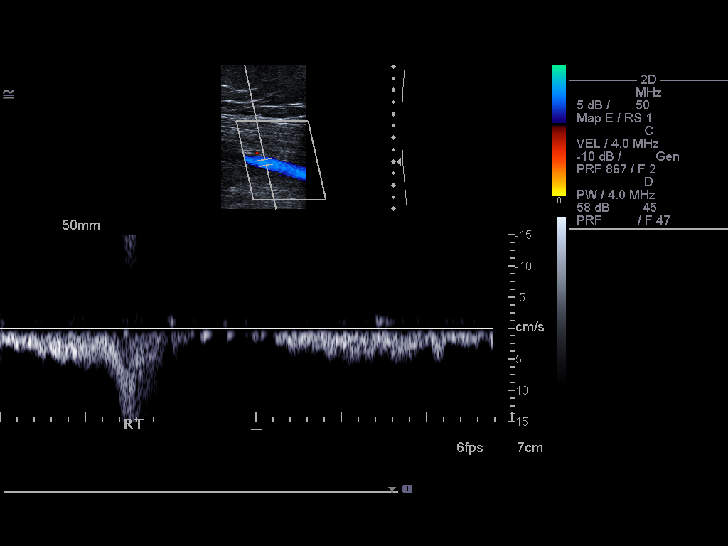
[im 11/20]
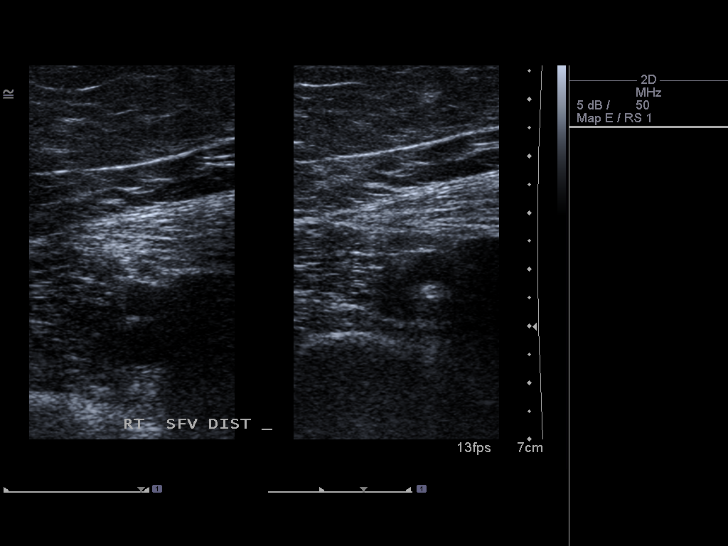
[im 13/20]
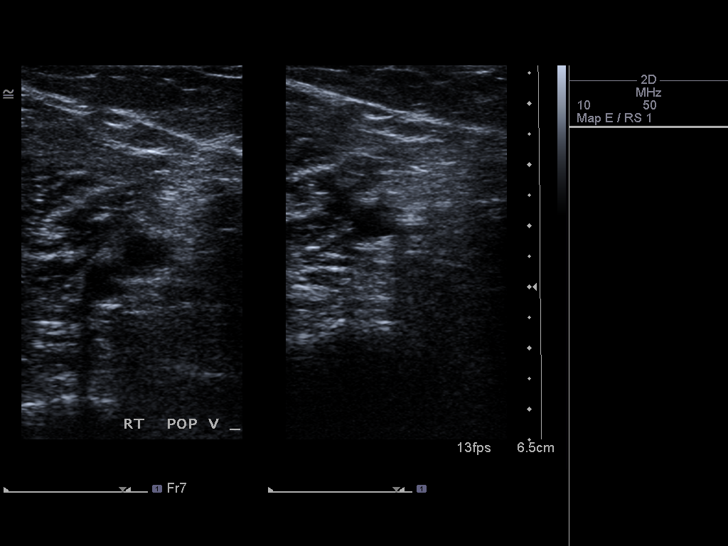
[im 14/20]
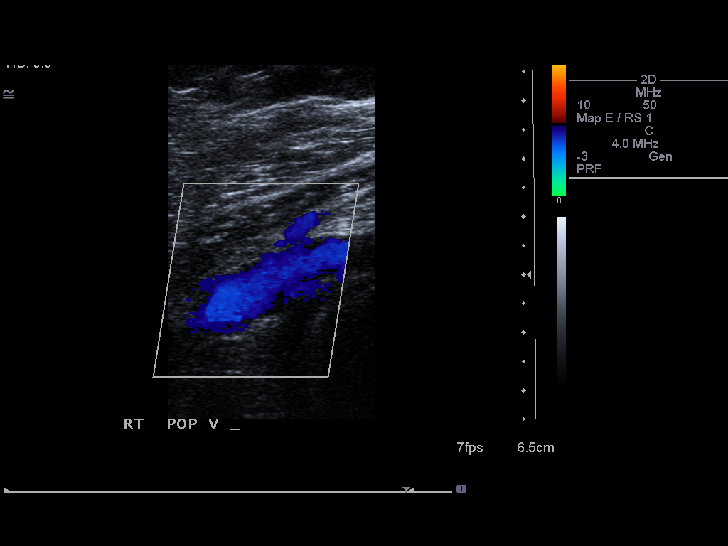
[im 16/20]
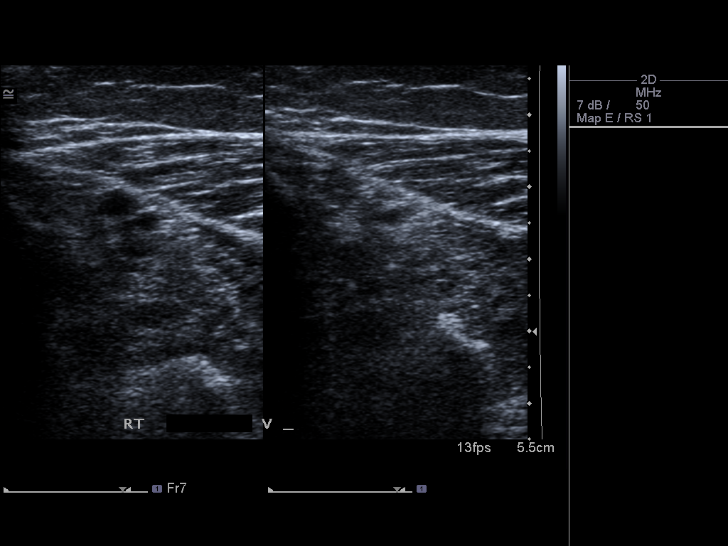
[im 17/20]
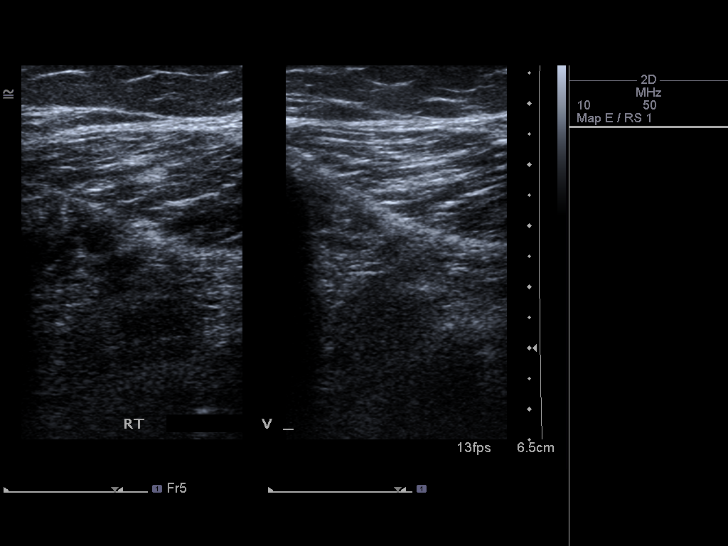
[im 18/20]
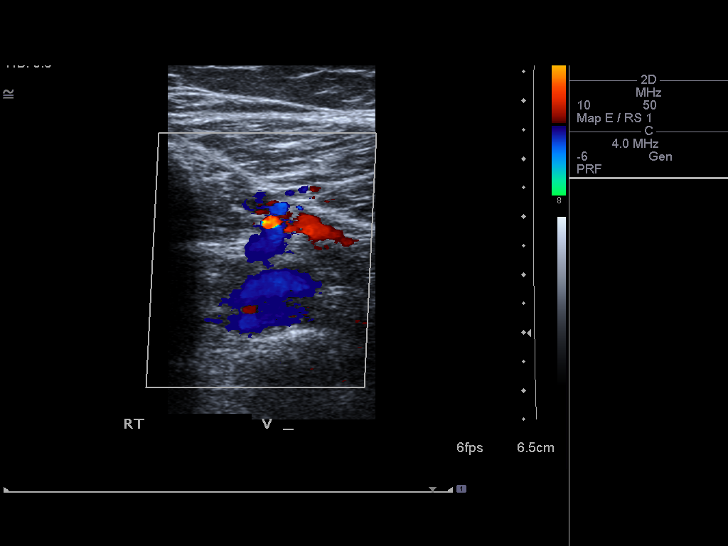
[im 20/20]
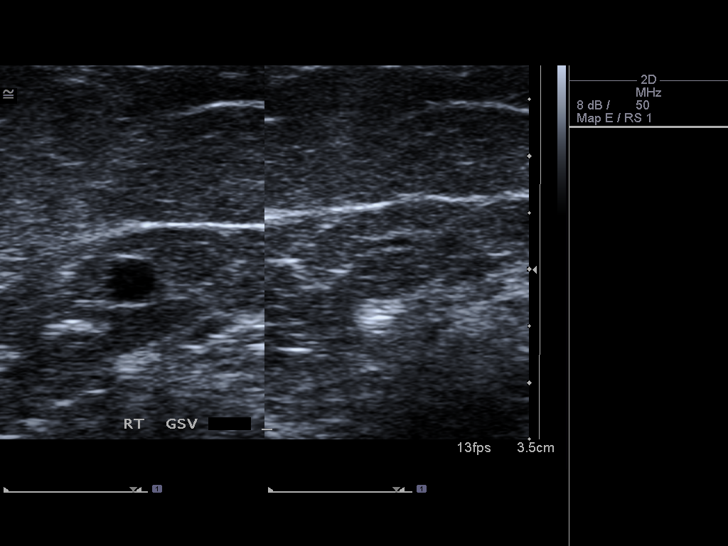

[14 of 20 positions shown; findings below may reference images not displayed]

FINDINGS: Thrombus within deep veins:  None visualized.

Compressibility of deep veins:  Normal.

Duplex waveform respiratory phasicity:  Normal.

Duplex waveform response to augmentation:  Normal.

Venous reflux:  None visualized.

Other findings:  None visualized.
IMPRESSION: No evidence of right lower extremity deep venous thrombosis.

## 2014-11-19 DIAGNOSIS — N281 Cyst of kidney, acquired: Secondary | ICD-10-CM | POA: Diagnosis not present

## 2014-11-19 DIAGNOSIS — K5730 Diverticulosis of large intestine without perforation or abscess without bleeding: Secondary | ICD-10-CM | POA: Diagnosis not present

## 2014-11-19 DIAGNOSIS — D1803 Hemangioma of intra-abdominal structures: Secondary | ICD-10-CM | POA: Diagnosis not present

## 2014-11-19 DIAGNOSIS — R109 Unspecified abdominal pain: Secondary | ICD-10-CM | POA: Diagnosis not present

## 2014-11-19 DIAGNOSIS — E876 Hypokalemia: Secondary | ICD-10-CM | POA: Diagnosis not present

## 2014-11-19 LAB — BASIC METABOLIC PANEL
Creatinine: 0.4 mg/dL — AB (ref 0.5–1.1)
Glucose: 98 mg/dL
Potassium: 4 mmol/L (ref 3.4–5.3)
Sodium: 139 mmol/L (ref 137–147)

## 2014-11-20 IMAGING — US US ABDOMEN LIMITED
1 series · 14 of 25 positions shown · non-contrast
Comparison: CT of the abdomen from earlier the same day

CLINICAL DATA: Rule out cholecystitis

EXAM:
US ABDOMEN LIMITED - RIGHT UPPER QUADRANT

[Series 1: us abdomen limited · 0.25mm/px · 14 of 45 slices shown]
[im 1/45]
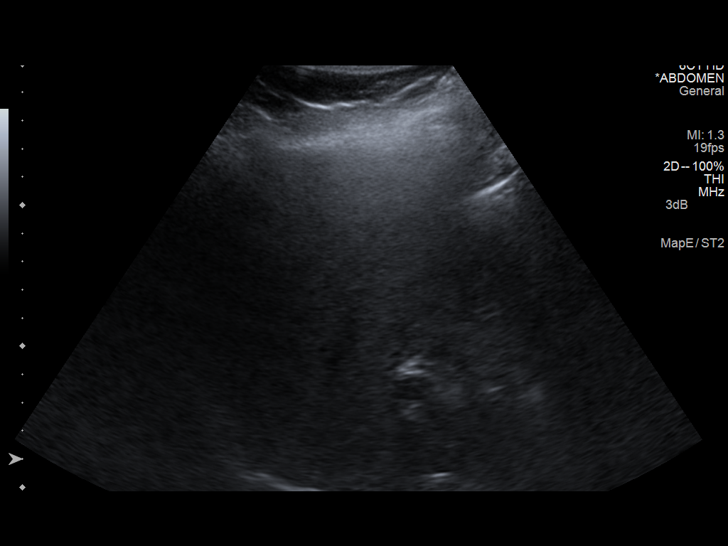
[im 4/45]
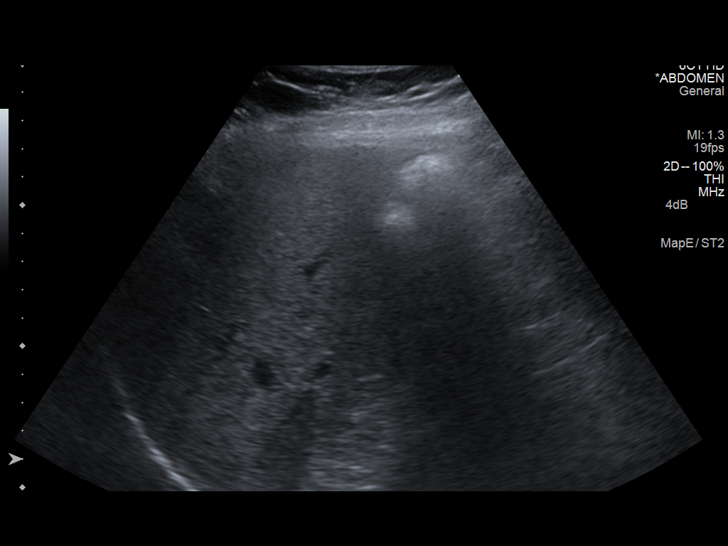
[im 8/45]
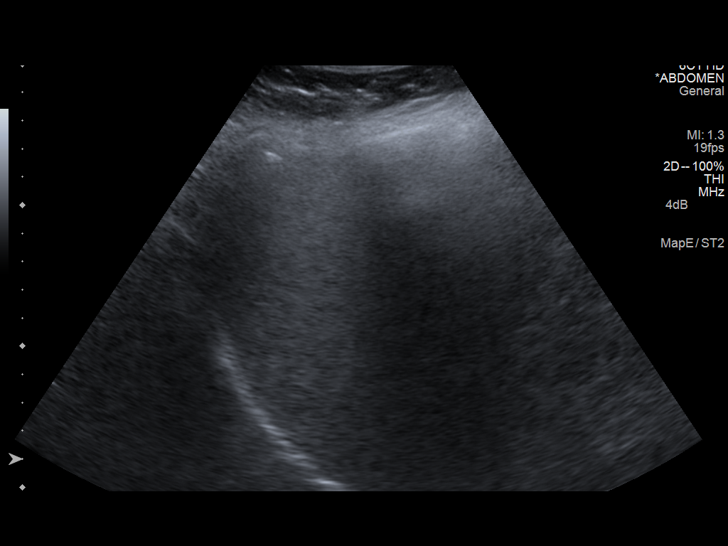
[im 12/45]
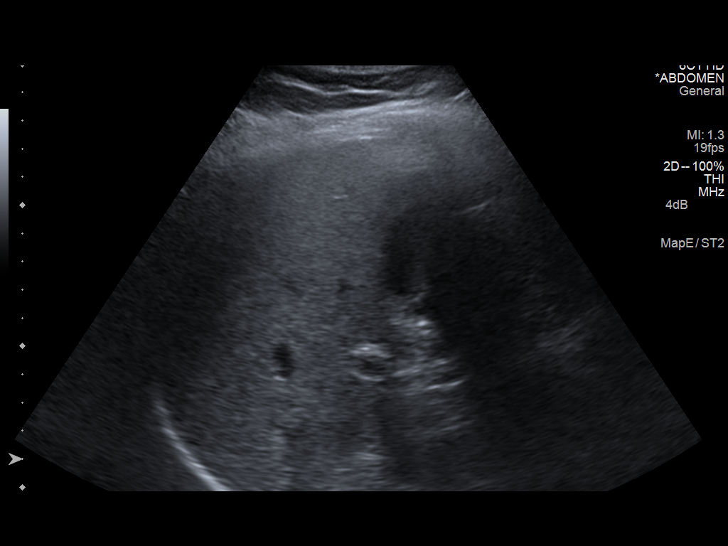
[im 15/45]
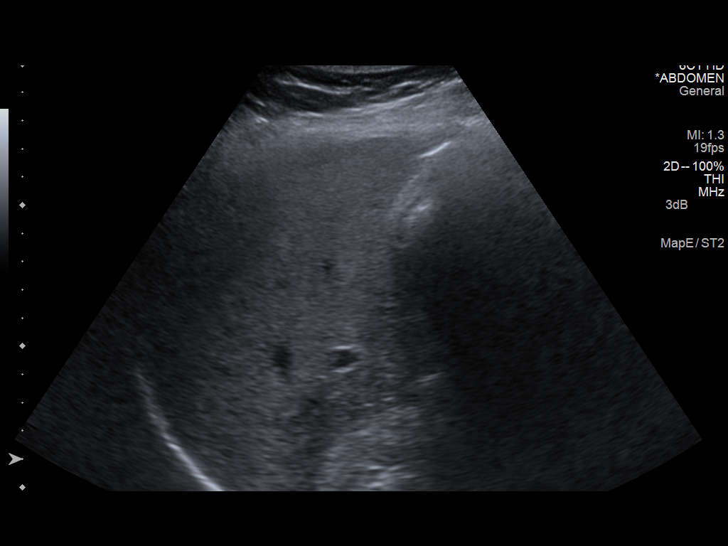
[im 17/45]
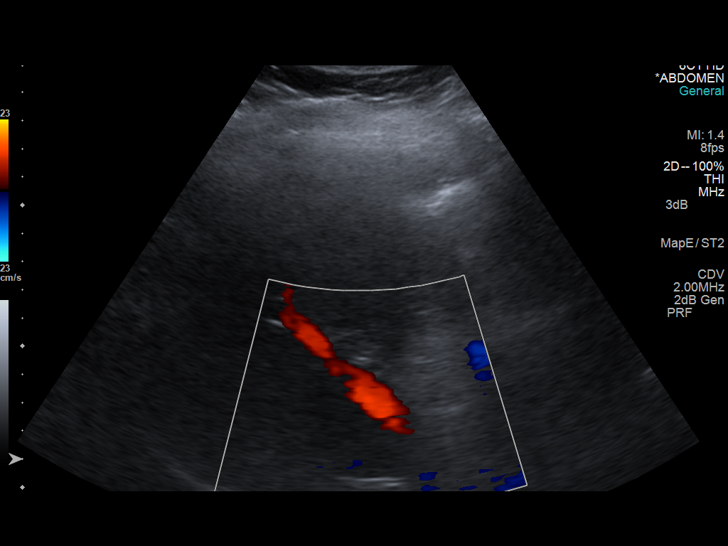
[im 21/45]
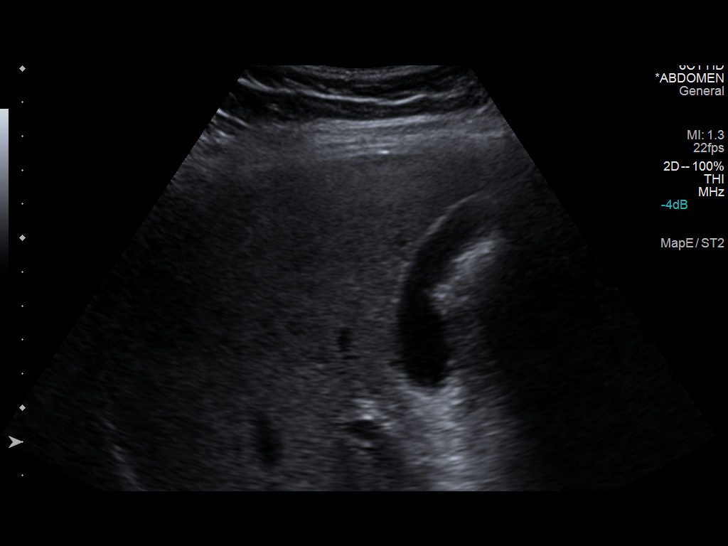
[im 24/45]
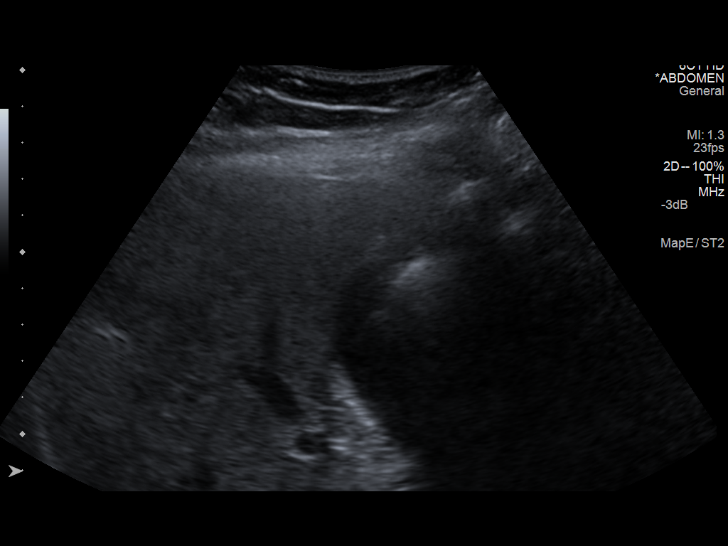
[im 28/45]
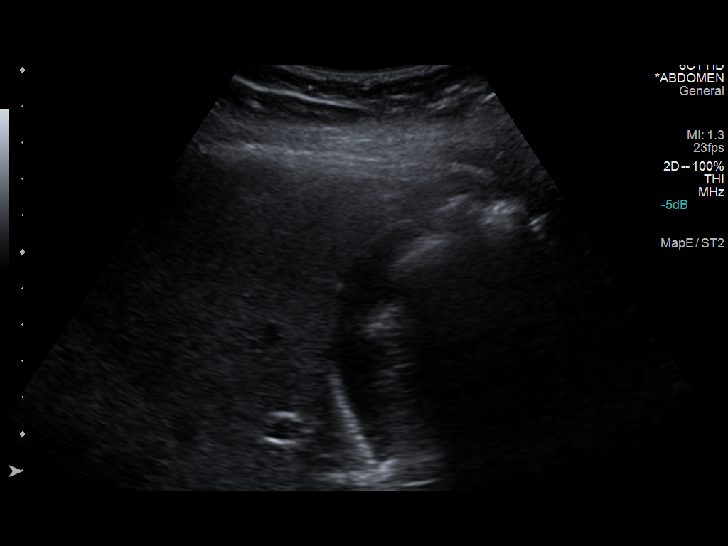
[im 30/45]
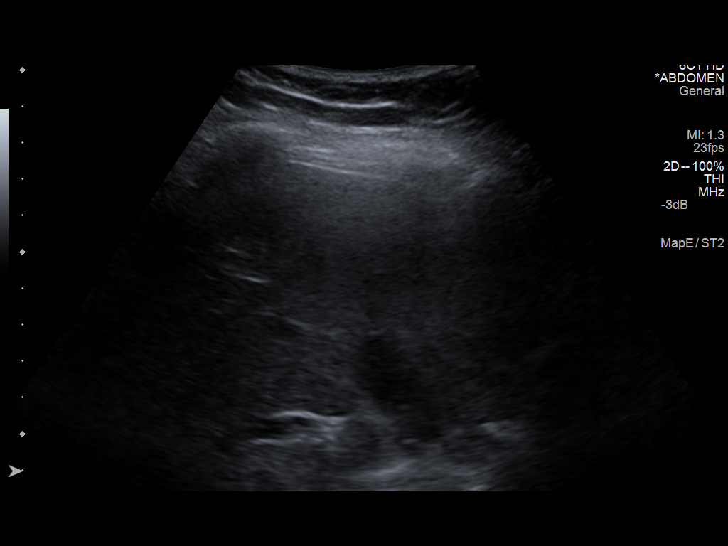
[im 34/45]
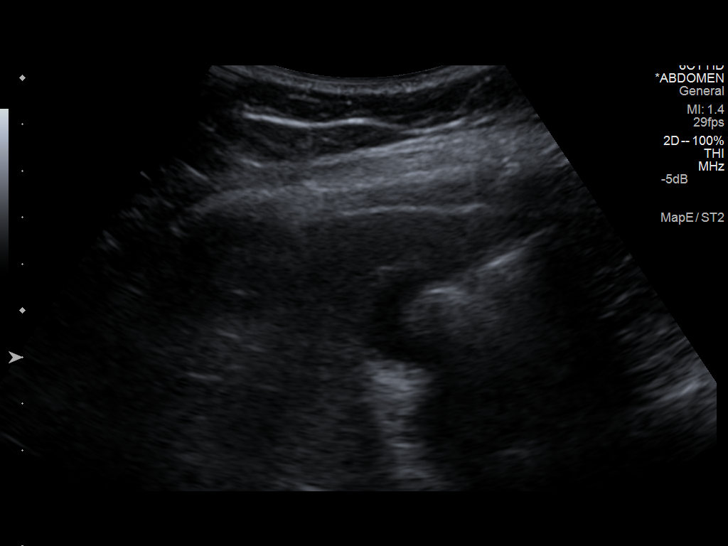
[im 37/45]
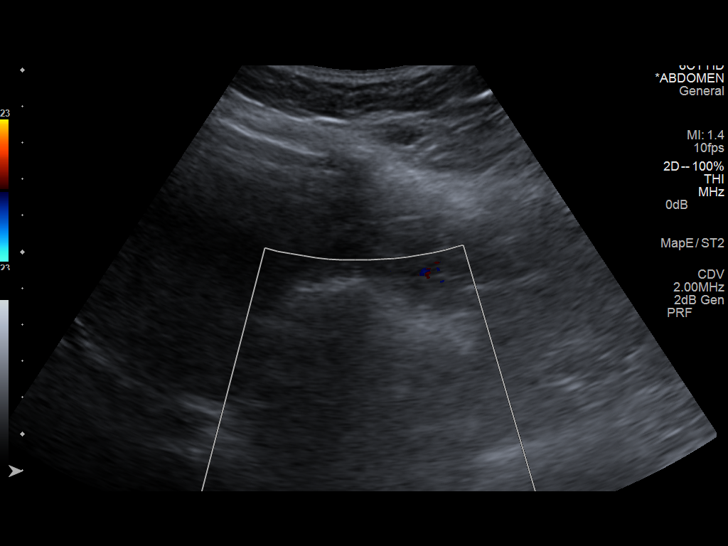
[im 41/45]
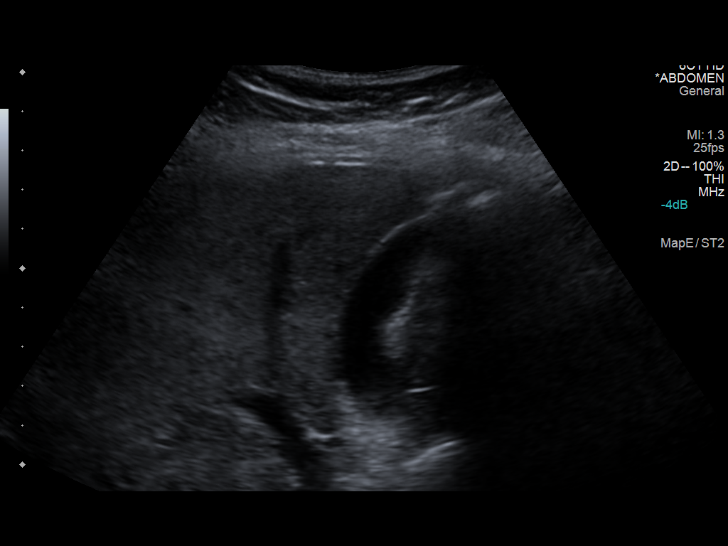
[im 45/45]
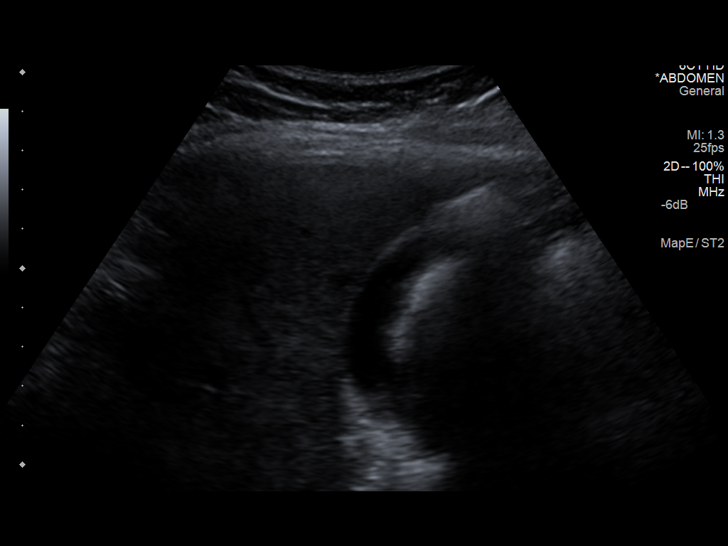

[14 of 25 positions shown; findings below may reference images not displayed]

FINDINGS: Gallbladder

No gallstones or wall thickening visualized. It is noted that there
was stone seen on recent sonography (07/07/2013), possibly not
visualized today due to difficulty with patient positioning. No
sonographic Murphy sign noted. The gallbladder is not dilated.

Common bile duct

Diameter: 4 mm

Liver:

The hepatic echotexture is coarsened and the echogenicity is
increased, compatible with mild steatotic infiltration. No focal
abnormality seen. Recently demonstrated right hepatic hemangioma was
not visualized due difficulty with sonographic windows/patient
positioning.
IMPRESSION: Negative for acute cholecystitis.

## 2014-11-20 IMAGING — CT CT ABD-PELV W/ CM
1 of 3 series · 14 of 32 positions shown, 19 images · IV contrast (OMNIPAQUE 300)
Comparison: MRI abdomen and pelvis 03/05/2013. CT abdomen and
pelvis 10/23/2012.

CLINICAL DATA: Robotic assisted total hysterectomy and bilateral
salpingo-oophorectomy on 07/29/2013, pathology revealing endometrial
adenocarcinoma with superficial myometrial invasion. Patient
presents today with nausea and vomiting and increasing back pain.

EXAM:
CT ABDOMEN AND PELVIS WITH CONTRAST
TECHNIQUE: Multidetector CT imaging of the abdomen and pelvis was performed
using the standard protocol following bolus administration of
intravenous contrast.
CONTRAST:  100mL OMNIPAQUE IOHEXOL 300 MG/ML IV. Oral contrast was
also administered.

[Series 2: abd/pel with · axial · 0.74mm/px · z∈[-302,+133]mm · 14 of 97 slices shown, 19 images]
[im 5/97  soft-tissue]
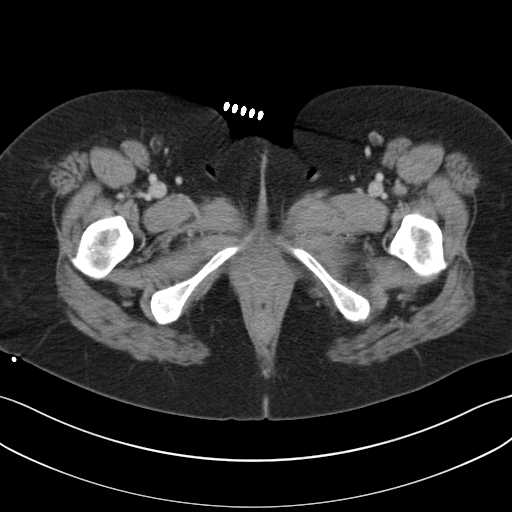
[im 5/97  bone]
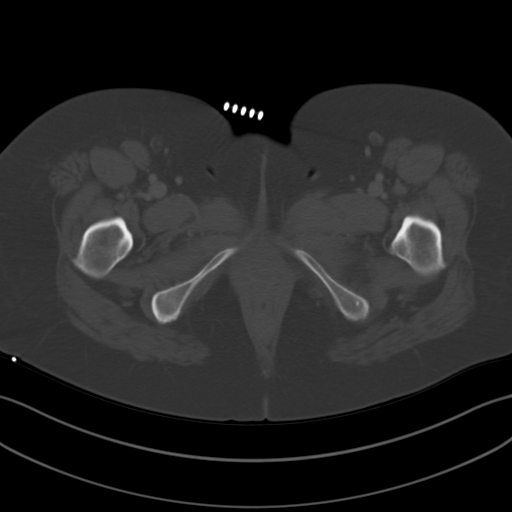
[im 15/97  soft-tissue]
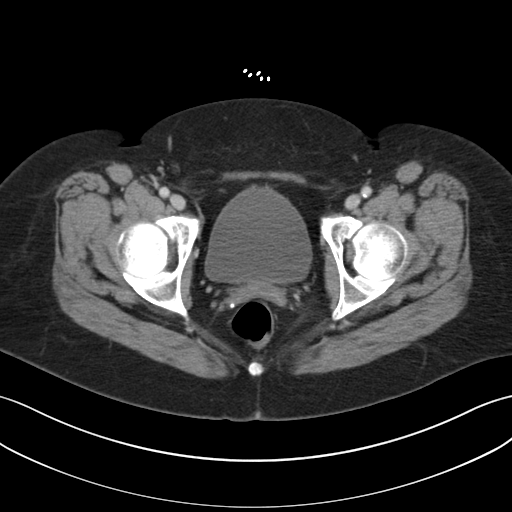
[im 20/97  soft-tissue]
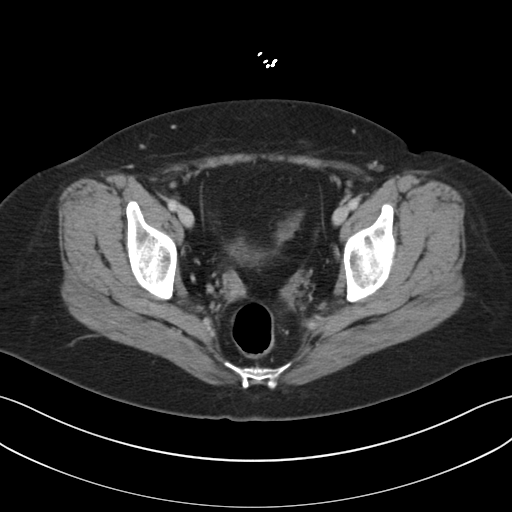
[im 29/97  soft-tissue]
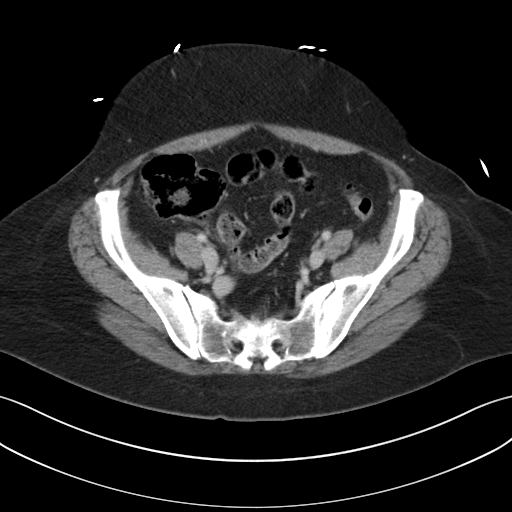
[im 34/97  soft-tissue]
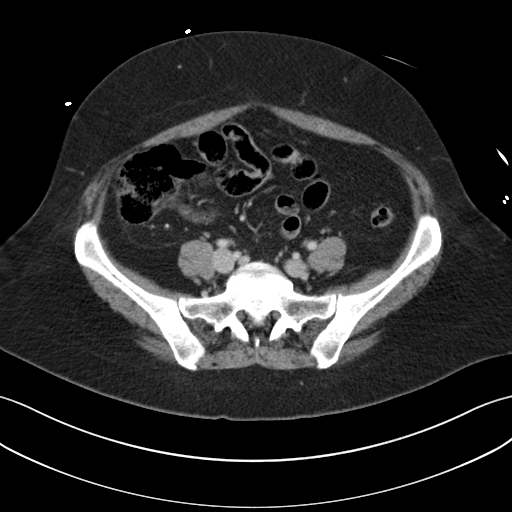
[im 44/97  soft-tissue]
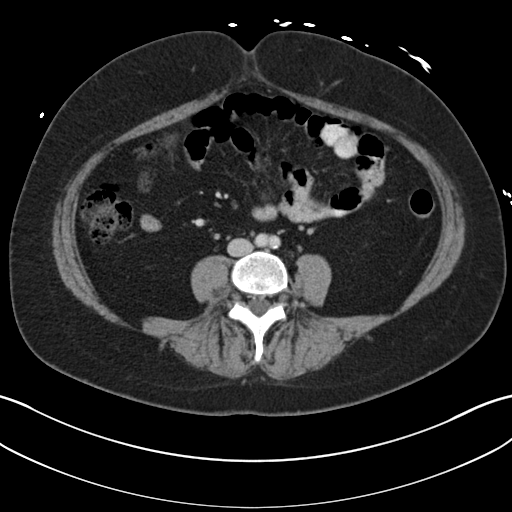
[im 49/97  soft-tissue]
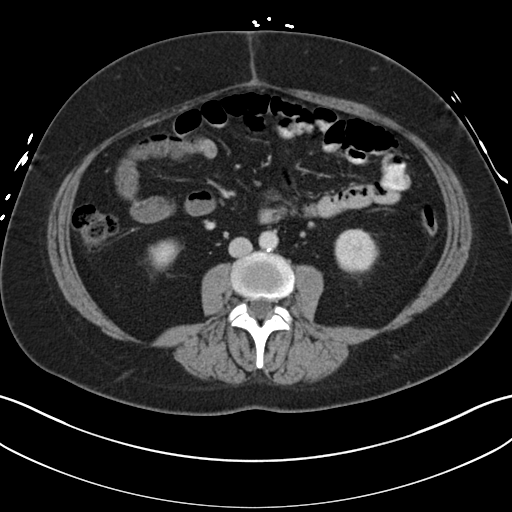
[im 53/97  soft-tissue]
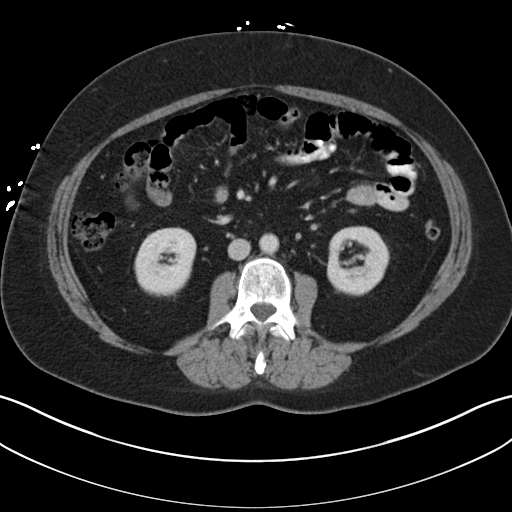
[im 63/97  soft-tissue]
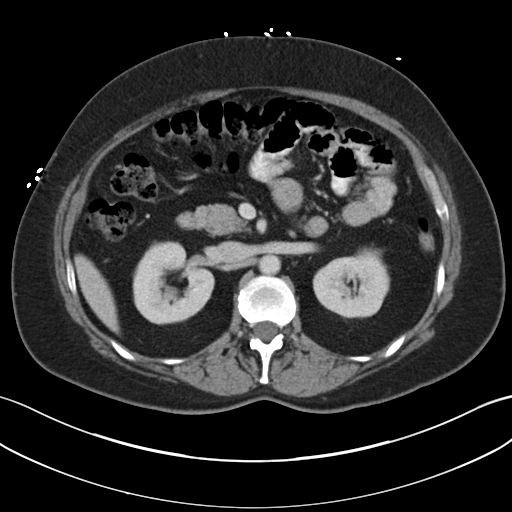
[im 63/97  bone]
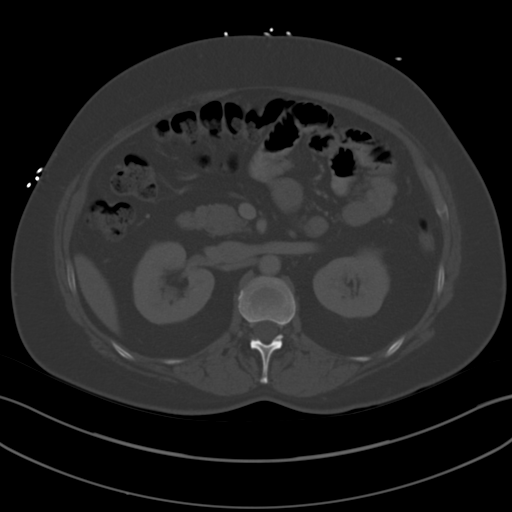
[im 68/97  soft-tissue]
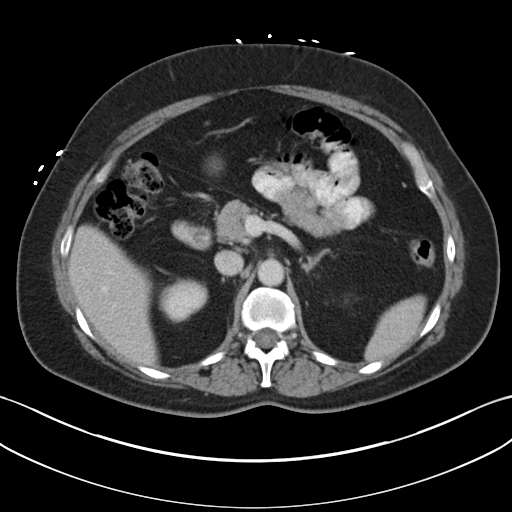
[im 77/97  soft-tissue]
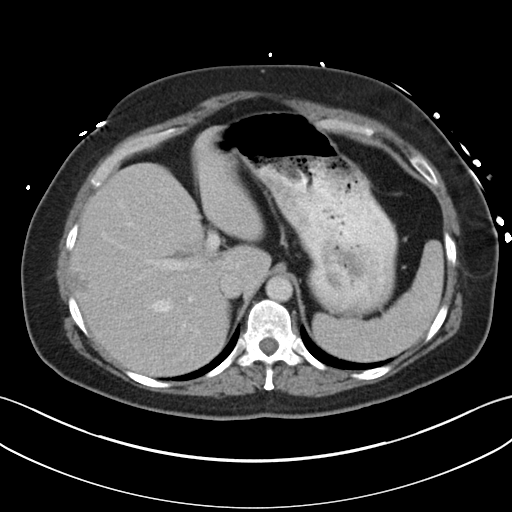
[im 77/97  lung]
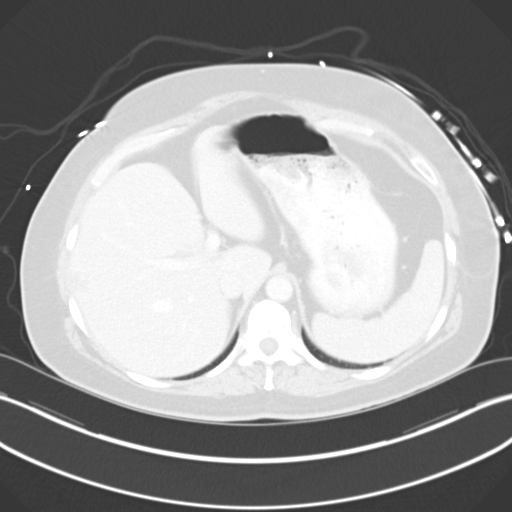
[im 82/97  soft-tissue]
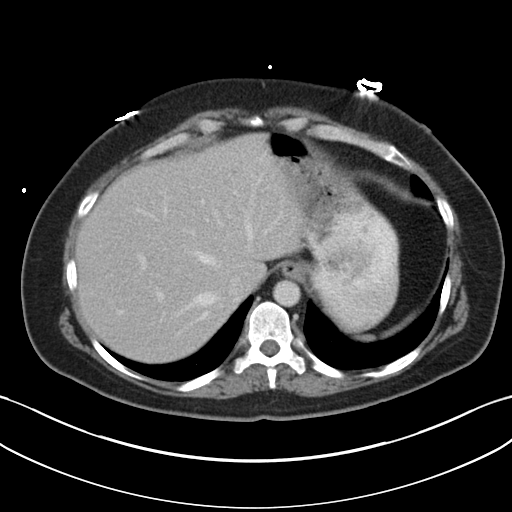
[im 82/97  lung]
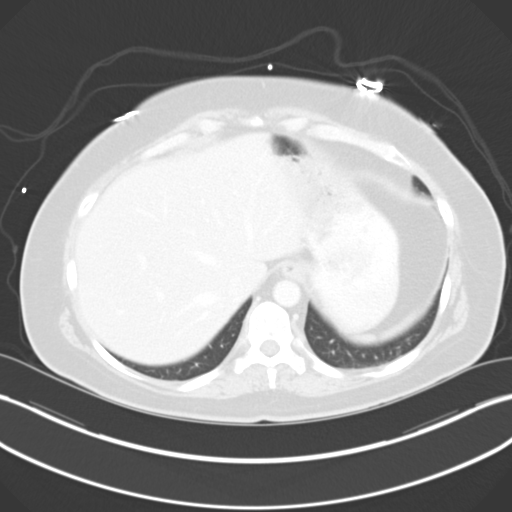
[im 87/97  lung]
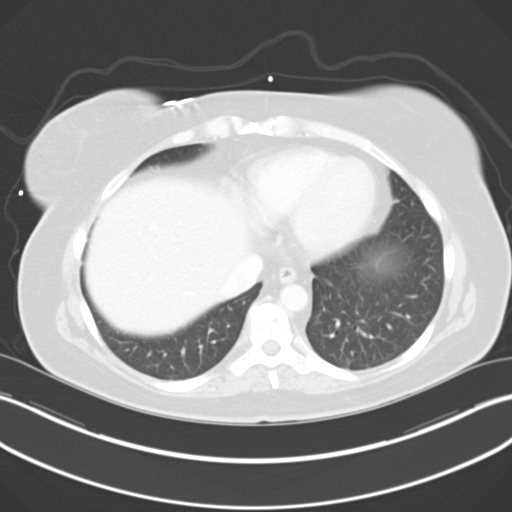
[im 92/97  soft-tissue]
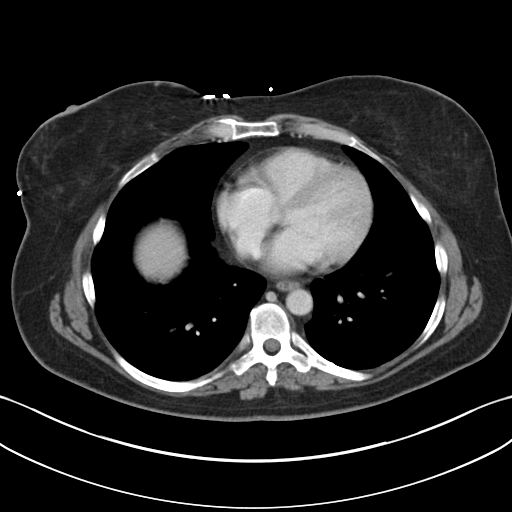
[im 92/97  lung]
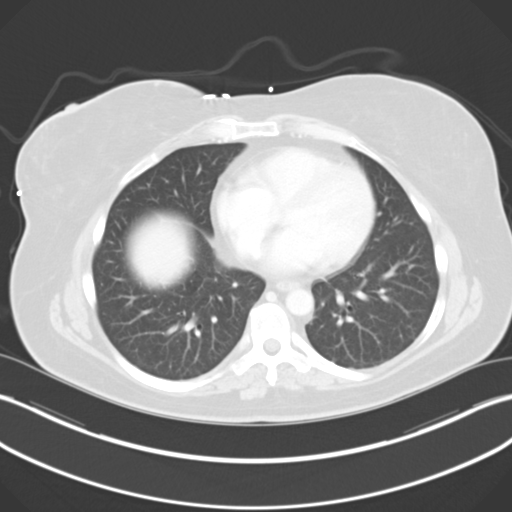

[14 of 32 positions shown; findings below may reference images not displayed]

FINDINGS: Approximate 1.7 cm hemangioma in the anterior segment right lobe of
liver, unchanged from the prior examinations. No significant
abnormality involving the liver. Normal-appearing spleen, pancreas,
adrenal glands, kidneys, and gallbladder. No biliary ductal
dilation. Mild aortoiliac atherosclerosis without aneurysm. No
significant lymphadenopathy in the abdomen or pelvis.

Stomach normal in appearance, filled with food. Normal appearing
small bowel and colon. Cecum extends low into the right side of the
pelvis. Appendix surgically absent. No ascites.

Uterus and ovaries surgically absent. No evidence of residual or
recurrent tumor in the pelvis. Urinary bladder unremarkable.
Phleboliths low in the right side of the pelvis.

Bone window images unremarkable. Visualized lung bases clear. Heart
size normal.
IMPRESSION: 1. No acute abnormalities involving the abdomen or pelvis.
2. No evidence of residual or recurrent tumor in the pelvis.
3. Stable 1.7 cm hemangioma in the anterior segment right lobe of
liver.
4. Mild aortoiliac atherosclerosis which is somewhat advanced for
age.

## 2014-11-21 IMAGING — CT CT ANGIO CHEST
1 of 2 series · 19 of 32 positions shown · IV contrast ([ID] OMNI 350)
Comparison: 08/24/2013

CLINICAL DATA: Recent surgery was shortness of breath. Rule out
pulmonary embolism.

EXAM:
CT ANGIOGRAPHY CHEST WITH CONTRAST
TECHNIQUE: Multidetector CT imaging of the chest was performed using the
standard protocol during bolus administration of intravenous
contrast. Multiplanar CT image reconstructions including MIPs were
obtained to evaluate the vascular anatomy.
CONTRAST:  100mL OMNIPAQUE IOHEXOL 350 MG/ML SOLN

[Series 5: thins for pacs · axial · 0.63mm/px · z∈[+1642,+1822]mm · 19 of 202 slices shown]
[im 11/202  lung]
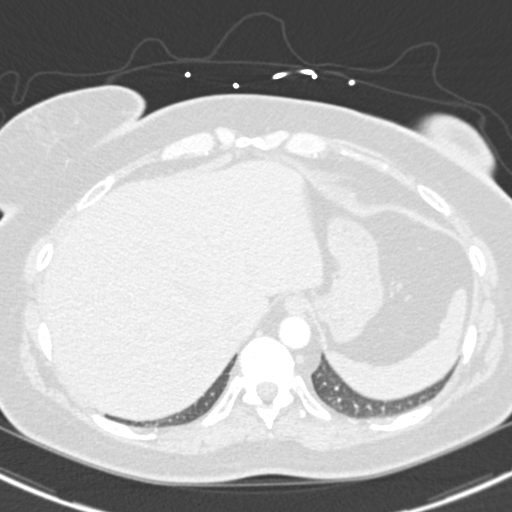
[im 21/202  mediastinal]
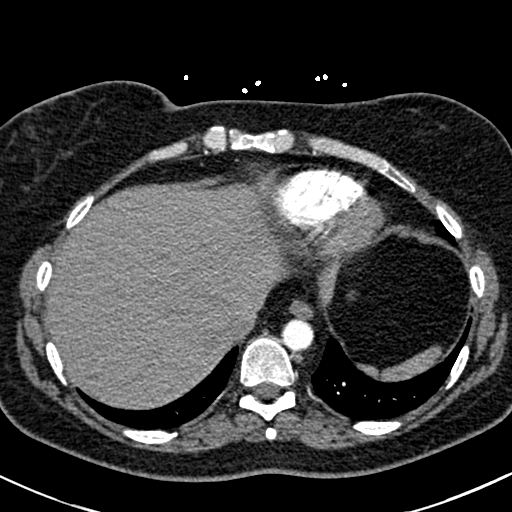
[im 31/202  lung]
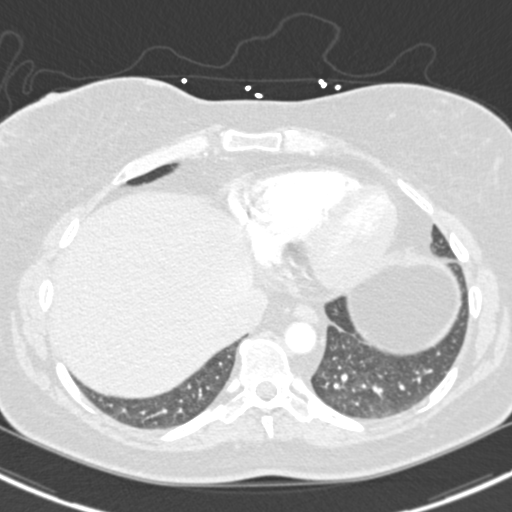
[im 51/202  mediastinal]
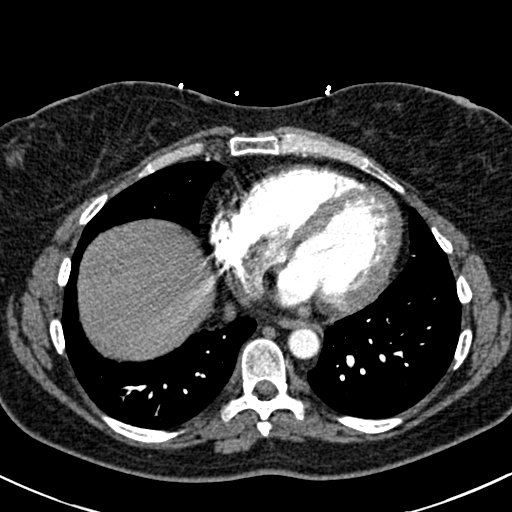
[im 61/202  lung]
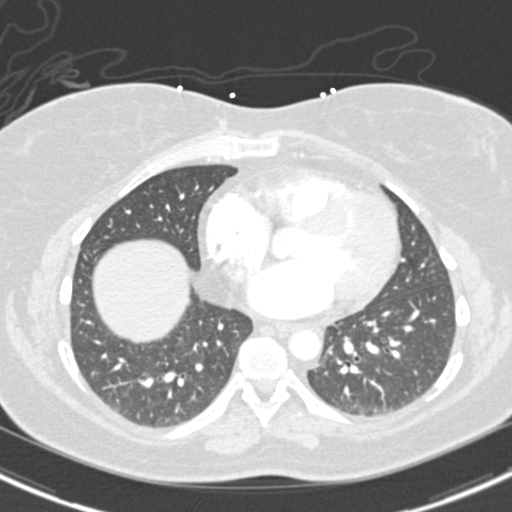
[im 68/202  mediastinal]
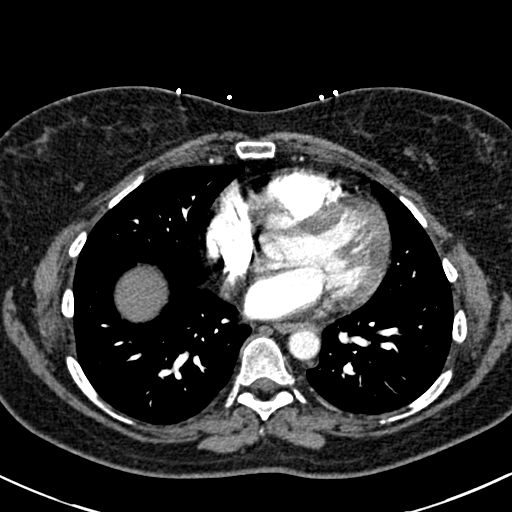
[im 71/202  lung]
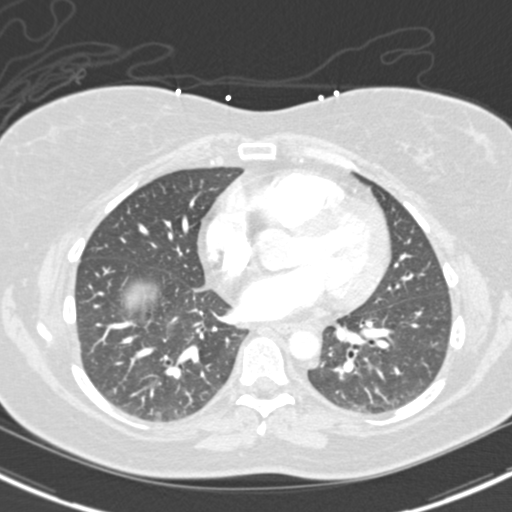
[im 81/202  mediastinal]
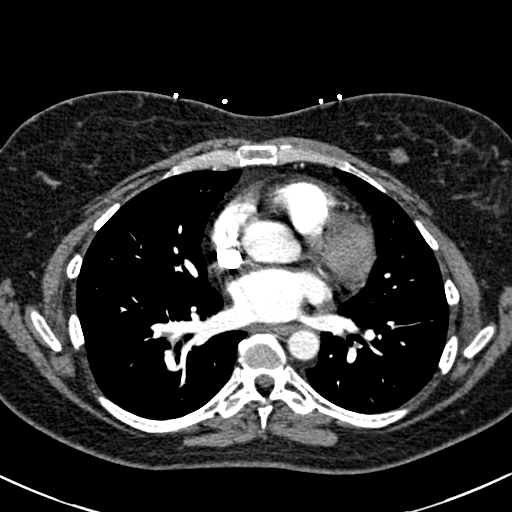
[im 91/202  lung]
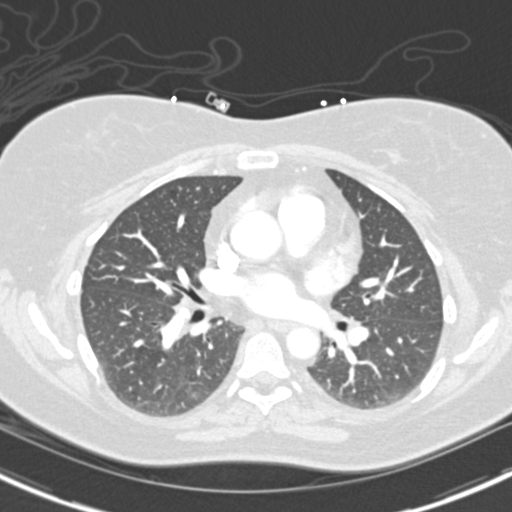
[im 101/202  mediastinal]
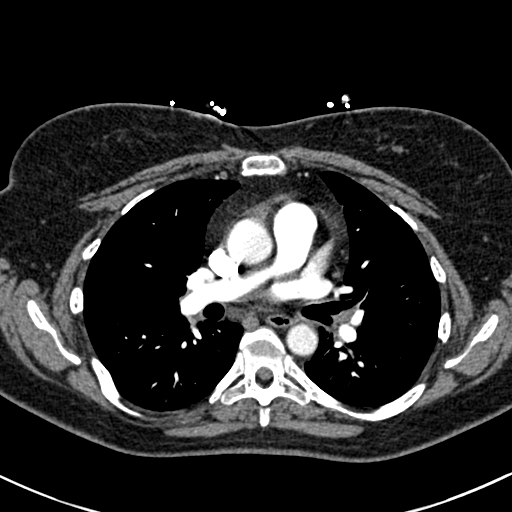
[im 111/202  lung]
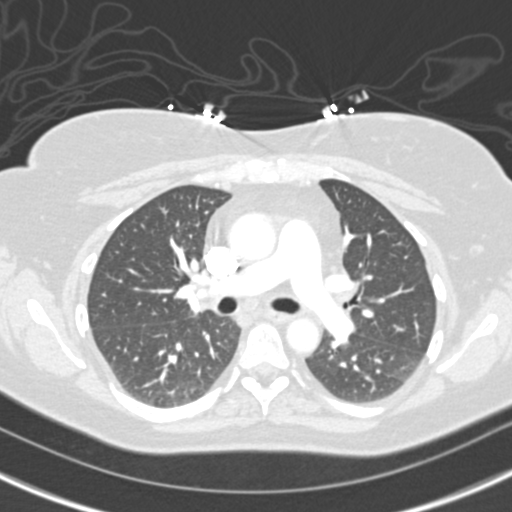
[im 121/202  mediastinal]
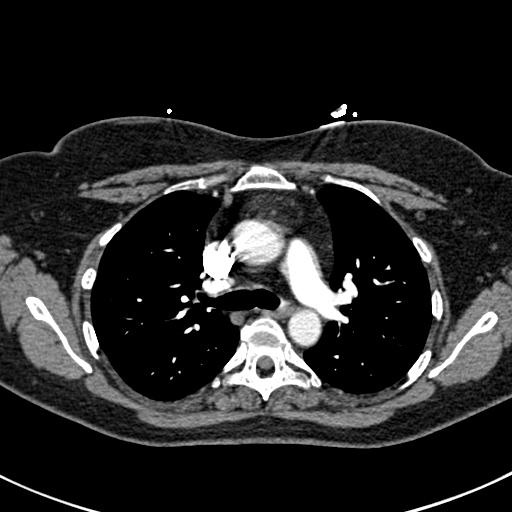
[im 131/202  lung]
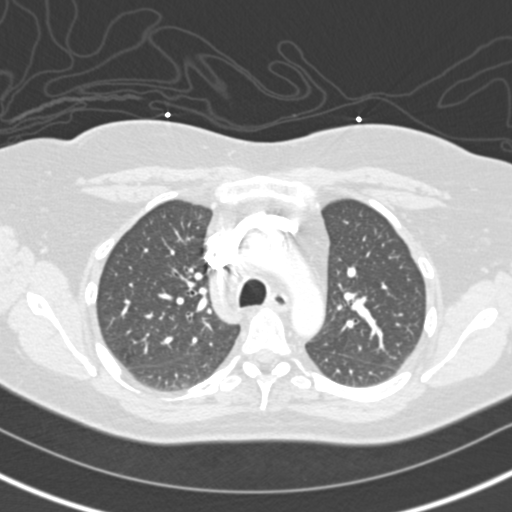
[im 135/202  mediastinal]
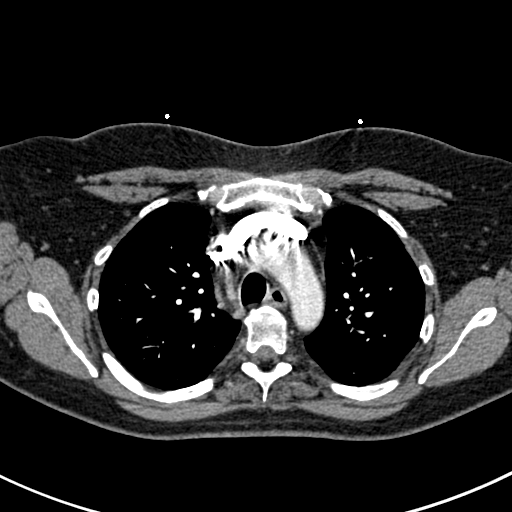
[im 141/202  lung]
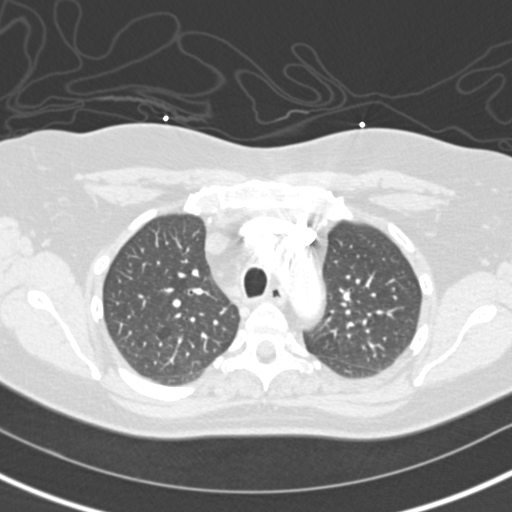
[im 151/202  mediastinal]
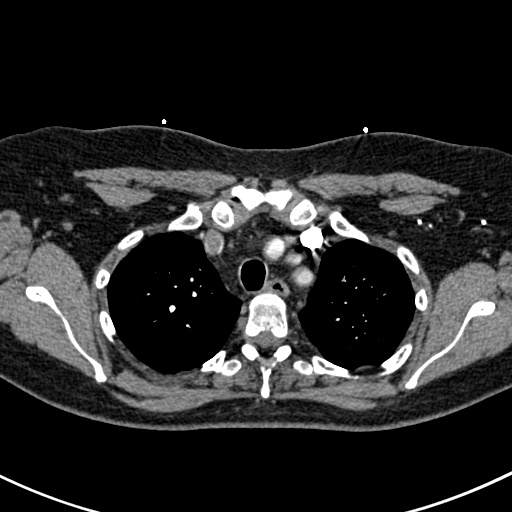
[im 171/202  lung]
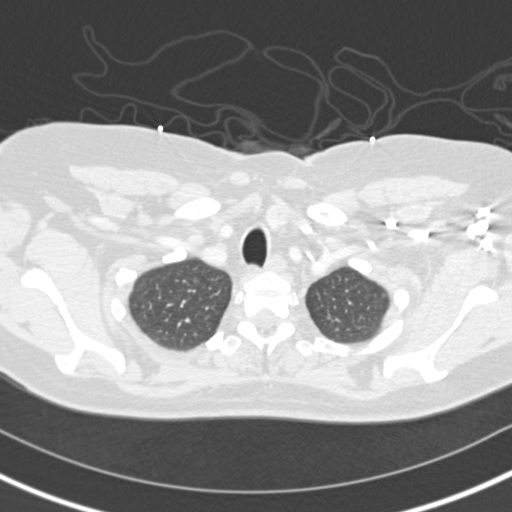
[im 181/202  mediastinal]
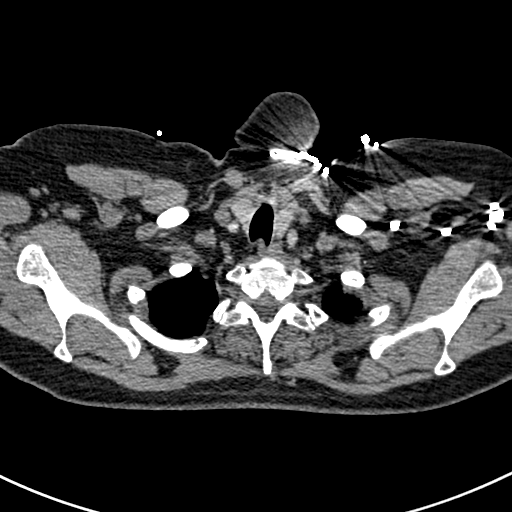
[im 191/202  lung]
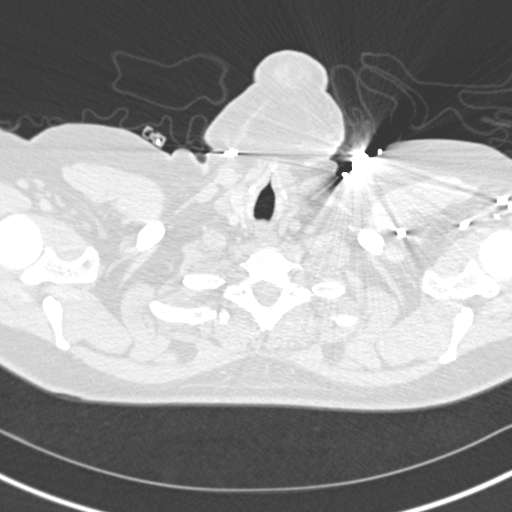

[19 of 32 positions shown; findings below may reference images not displayed]

FINDINGS: THORACIC INLET/BODY WALL:

Focal 14 mm upper left breast density has been previously reported
and evaluated by mammography.

MEDIASTINUM:

Normal heart size. No pericardial effusion. No acute vascular
abnormality, including pulmonary embolism or aortic dissection. No
adenopathy.

LUNG WINDOWS:

No consolidation.  No effusion.  No suspicious pulmonary nodule.

UPPER ABDOMEN:

No acute findings.

OSSEOUS:

Remote posterior right sixth rib fracture.

Review of the MIP images confirms the above findings.
IMPRESSION: Negative for pulmonary embolism or other acute intrathoracic
abnormality.

## 2014-11-23 DIAGNOSIS — Z79899 Other long term (current) drug therapy: Secondary | ICD-10-CM | POA: Diagnosis not present

## 2014-11-23 DIAGNOSIS — J45909 Unspecified asthma, uncomplicated: Secondary | ICD-10-CM | POA: Diagnosis not present

## 2014-11-23 DIAGNOSIS — Z8673 Personal history of transient ischemic attack (TIA), and cerebral infarction without residual deficits: Secondary | ICD-10-CM | POA: Diagnosis not present

## 2014-11-23 DIAGNOSIS — J018 Other acute sinusitis: Secondary | ICD-10-CM | POA: Diagnosis not present

## 2014-11-23 DIAGNOSIS — I10 Essential (primary) hypertension: Secondary | ICD-10-CM | POA: Diagnosis not present

## 2014-11-24 ENCOUNTER — Encounter (HOSPITAL_BASED_OUTPATIENT_CLINIC_OR_DEPARTMENT_OTHER): Payer: Self-pay | Admitting: Emergency Medicine

## 2014-11-24 ENCOUNTER — Emergency Department (HOSPITAL_BASED_OUTPATIENT_CLINIC_OR_DEPARTMENT_OTHER): Payer: Medicare Other

## 2014-11-24 ENCOUNTER — Emergency Department (HOSPITAL_BASED_OUTPATIENT_CLINIC_OR_DEPARTMENT_OTHER)
Admission: EM | Admit: 2014-11-24 | Discharge: 2014-11-24 | Disposition: A | Discharge status: Home / Self Care | Payer: Medicare Other | Attending: Emergency Medicine | Admitting: Emergency Medicine

## 2014-11-24 DIAGNOSIS — M79601 Pain in right arm: Secondary | ICD-10-CM | POA: Diagnosis not present

## 2014-11-24 DIAGNOSIS — M79621 Pain in right upper arm: Secondary | ICD-10-CM | POA: Diagnosis not present

## 2014-11-24 DIAGNOSIS — Z862 Personal history of diseases of the blood and blood-forming organs and certain disorders involving the immune mechanism: Secondary | ICD-10-CM | POA: Insufficient documentation

## 2014-11-24 DIAGNOSIS — R51 Headache: Secondary | ICD-10-CM | POA: Insufficient documentation

## 2014-11-24 DIAGNOSIS — Z8542 Personal history of malignant neoplasm of other parts of uterus: Secondary | ICD-10-CM | POA: Insufficient documentation

## 2014-11-24 DIAGNOSIS — Z8614 Personal history of Methicillin resistant Staphylococcus aureus infection: Secondary | ICD-10-CM | POA: Insufficient documentation

## 2014-11-24 DIAGNOSIS — Z792 Long term (current) use of antibiotics: Secondary | ICD-10-CM | POA: Diagnosis not present

## 2014-11-24 DIAGNOSIS — M25511 Pain in right shoulder: Secondary | ICD-10-CM | POA: Diagnosis not present

## 2014-11-24 DIAGNOSIS — Z7952 Long term (current) use of systemic steroids: Secondary | ICD-10-CM | POA: Insufficient documentation

## 2014-11-24 DIAGNOSIS — Z87891 Personal history of nicotine dependence: Secondary | ICD-10-CM | POA: Diagnosis not present

## 2014-11-24 DIAGNOSIS — E785 Hyperlipidemia, unspecified: Secondary | ICD-10-CM | POA: Diagnosis not present

## 2014-11-24 DIAGNOSIS — Z79899 Other long term (current) drug therapy: Secondary | ICD-10-CM | POA: Diagnosis not present

## 2014-11-24 DIAGNOSIS — J45909 Unspecified asthma, uncomplicated: Secondary | ICD-10-CM | POA: Diagnosis not present

## 2014-11-24 DIAGNOSIS — I10 Essential (primary) hypertension: Secondary | ICD-10-CM | POA: Diagnosis not present

## 2014-11-24 DIAGNOSIS — Z880 Allergy status to penicillin: Secondary | ICD-10-CM | POA: Insufficient documentation

## 2014-11-24 DIAGNOSIS — G8929 Other chronic pain: Secondary | ICD-10-CM | POA: Diagnosis not present

## 2014-11-24 DIAGNOSIS — K219 Gastro-esophageal reflux disease without esophagitis: Secondary | ICD-10-CM | POA: Diagnosis not present

## 2014-11-24 DIAGNOSIS — M7989 Other specified soft tissue disorders: Secondary | ICD-10-CM | POA: Diagnosis not present

## 2014-11-24 MED ORDER — ACETAMINOPHEN 500 MG PO TABS
1000.0000 mg | ORAL_TABLET | Freq: Once | ORAL | Status: AC
  Administered 2014-11-24: 1000 mg via ORAL
  Filled 2014-11-24: qty 2

## 2014-11-24 NOTE — ED Notes (Signed)
MD at bedside. 

## 2014-11-24 NOTE — ED Notes (Signed)
Pt reports MRI in right arm last Thursday and pain with swelling began. States "they put dye in my vein and the vein blew."
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
## 2014-11-26 ENCOUNTER — Telehealth: Payer: Self-pay | Admitting: Family Medicine

## 2014-11-26 ENCOUNTER — Ambulatory Visit (INDEPENDENT_AMBULATORY_CARE_PROVIDER_SITE_OTHER): Payer: Medicare Other | Admitting: Family Medicine

## 2014-11-26 ENCOUNTER — Encounter: Payer: Self-pay | Admitting: Family Medicine

## 2014-11-26 VITALS — BP 150/78 | HR 95 | Ht 62.0 in | Wt 195.0 lb

## 2014-11-26 DIAGNOSIS — F4310 Post-traumatic stress disorder, unspecified: Secondary | ICD-10-CM

## 2014-11-26 DIAGNOSIS — M7989 Other specified soft tissue disorders: Secondary | ICD-10-CM | POA: Diagnosis not present

## 2014-11-26 DIAGNOSIS — M25511 Pain in right shoulder: Secondary | ICD-10-CM

## 2014-11-26 DIAGNOSIS — J029 Acute pharyngitis, unspecified: Secondary | ICD-10-CM

## 2014-11-26 DIAGNOSIS — E041 Nontoxic single thyroid nodule: Secondary | ICD-10-CM

## 2014-11-26 DIAGNOSIS — R0982 Postnasal drip: Secondary | ICD-10-CM

## 2014-11-26 NOTE — Telephone Encounter (Signed)
Patient called requesting [place] to schedule her MRI Rt Shoulder. Pt states she can not work around the time schedule with Imaging at [facility] or [facility].
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
## 2014-11-27 ENCOUNTER — Encounter: Payer: Self-pay | Admitting: Family Medicine

## 2014-11-27 DIAGNOSIS — J301 Allergic rhinitis due to pollen: Secondary | ICD-10-CM | POA: Diagnosis not present

## 2014-11-27 DIAGNOSIS — R1314 Dysphagia, pharyngoesophageal phase: Secondary | ICD-10-CM | POA: Diagnosis not present

## 2014-11-27 DIAGNOSIS — J387 Other diseases of larynx: Secondary | ICD-10-CM | POA: Diagnosis not present

## 2014-12-03 ENCOUNTER — Telehealth: Payer: Self-pay | Admitting: Family Medicine

## 2014-12-03 ENCOUNTER — Emergency Department (HOSPITAL_BASED_OUTPATIENT_CLINIC_OR_DEPARTMENT_OTHER)
Admission: EM | Admit: 2014-12-03 | Discharge: 2014-12-03 | Disposition: A | Discharge status: Home / Self Care | Payer: Medicare Other | Attending: Emergency Medicine | Admitting: Emergency Medicine

## 2014-12-03 ENCOUNTER — Encounter (HOSPITAL_BASED_OUTPATIENT_CLINIC_OR_DEPARTMENT_OTHER): Payer: Self-pay | Admitting: *Deleted

## 2014-12-03 DIAGNOSIS — Z8719 Personal history of other diseases of the digestive system: Secondary | ICD-10-CM | POA: Insufficient documentation

## 2014-12-03 DIAGNOSIS — Z9851 Tubal ligation status: Secondary | ICD-10-CM | POA: Insufficient documentation

## 2014-12-03 DIAGNOSIS — K219 Gastro-esophageal reflux disease without esophagitis: Secondary | ICD-10-CM | POA: Diagnosis not present

## 2014-12-03 DIAGNOSIS — Z885 Allergy status to narcotic agent status: Secondary | ICD-10-CM | POA: Diagnosis not present

## 2014-12-03 DIAGNOSIS — I10 Essential (primary) hypertension: Secondary | ICD-10-CM | POA: Insufficient documentation

## 2014-12-03 DIAGNOSIS — Z8639 Personal history of other endocrine, nutritional and metabolic disease: Secondary | ICD-10-CM | POA: Diagnosis not present

## 2014-12-03 DIAGNOSIS — Z87891 Personal history of nicotine dependence: Secondary | ICD-10-CM | POA: Insufficient documentation

## 2014-12-03 DIAGNOSIS — Z8739 Personal history of other diseases of the musculoskeletal system and connective tissue: Secondary | ICD-10-CM | POA: Insufficient documentation

## 2014-12-03 DIAGNOSIS — Z8614 Personal history of Methicillin resistant Staphylococcus aureus infection: Secondary | ICD-10-CM | POA: Insufficient documentation

## 2014-12-03 DIAGNOSIS — N898 Other specified noninflammatory disorders of vagina: Secondary | ICD-10-CM | POA: Diagnosis not present

## 2014-12-03 DIAGNOSIS — J45909 Unspecified asthma, uncomplicated: Secondary | ICD-10-CM | POA: Diagnosis not present

## 2014-12-03 DIAGNOSIS — Z8669 Personal history of other diseases of the nervous system and sense organs: Secondary | ICD-10-CM | POA: Insufficient documentation

## 2014-12-03 DIAGNOSIS — R490 Dysphonia: Secondary | ICD-10-CM | POA: Diagnosis not present

## 2014-12-03 DIAGNOSIS — Z9889 Other specified postprocedural states: Secondary | ICD-10-CM | POA: Insufficient documentation

## 2014-12-03 DIAGNOSIS — Z9049 Acquired absence of other specified parts of digestive tract: Secondary | ICD-10-CM | POA: Insufficient documentation

## 2014-12-03 DIAGNOSIS — F329 Major depressive disorder, single episode, unspecified: Secondary | ICD-10-CM | POA: Diagnosis not present

## 2014-12-03 DIAGNOSIS — Z79899 Other long term (current) drug therapy: Secondary | ICD-10-CM | POA: Diagnosis not present

## 2014-12-03 DIAGNOSIS — R109 Unspecified abdominal pain: Secondary | ICD-10-CM | POA: Diagnosis present

## 2014-12-03 DIAGNOSIS — Z8542 Personal history of malignant neoplasm of other parts of uterus: Secondary | ICD-10-CM | POA: Insufficient documentation

## 2014-12-03 DIAGNOSIS — R000 Tachycardia, unspecified: Secondary | ICD-10-CM

## 2014-12-03 DIAGNOSIS — Z888 Allergy status to other drugs, medicaments and biological substances status: Secondary | ICD-10-CM | POA: Diagnosis not present

## 2014-12-03 DIAGNOSIS — F419 Anxiety disorder, unspecified: Secondary | ICD-10-CM | POA: Diagnosis not present

## 2014-12-03 DIAGNOSIS — Z9981 Dependence on supplemental oxygen: Secondary | ICD-10-CM | POA: Insufficient documentation

## 2014-12-03 DIAGNOSIS — R569 Unspecified convulsions: Secondary | ICD-10-CM | POA: Diagnosis not present

## 2014-12-03 DIAGNOSIS — Z881 Allergy status to other antibiotic agents status: Secondary | ICD-10-CM | POA: Diagnosis not present

## 2014-12-03 DIAGNOSIS — Z202 Contact with and (suspected) exposure to infections with a predominantly sexual mode of transmission: Secondary | ICD-10-CM

## 2014-12-03 DIAGNOSIS — R1310 Dysphagia, unspecified: Secondary | ICD-10-CM | POA: Diagnosis not present

## 2014-12-03 DIAGNOSIS — Z880 Allergy status to penicillin: Secondary | ICD-10-CM | POA: Insufficient documentation

## 2014-12-03 DIAGNOSIS — R196 Halitosis: Secondary | ICD-10-CM | POA: Diagnosis not present

## 2014-12-03 DIAGNOSIS — G35 Multiple sclerosis: Secondary | ICD-10-CM | POA: Diagnosis not present

## 2014-12-03 LAB — URINALYSIS, ROUTINE W REFLEX MICROSCOPIC
Bilirubin Urine: NEGATIVE
Glucose, UA: NEGATIVE mg/dL
Hgb urine dipstick: NEGATIVE
Ketones, ur: NEGATIVE mg/dL
Nitrite: NEGATIVE
Protein, ur: NEGATIVE mg/dL
Specific Gravity, Urine: 1.023 (ref 1.005–1.030)
Urobilinogen, UA: 1 mg/dL (ref 0.0–1.0)
pH: 6 (ref 5.0–8.0)

## 2014-12-03 LAB — WET PREP, GENITAL
Trich, Wet Prep: NONE SEEN
Yeast Wet Prep HPF POC: NONE SEEN

## 2014-12-03 LAB — URINE MICROSCOPIC-ADD ON

## 2014-12-03 NOTE — Telephone Encounter (Signed)
Patient called clinic stating she is having extreme muscle cramps to the point that she cannot walk. Pt was in the UC for "female problems" earlier today and experienced an extreme leg cramp there. Pt states they did not order any labs for her other than "female labs" and she wonders if maybe her "Potassium or Magnesium" are low? Reviewed last Potassium levels with her, she replied her "levels fluctuate drastically." Pt wonders if this is something related to her MS or if it is something else. Advised we do not have any opening in the clinic tomorrow but she could go back to UC if she cannot wait until next week. Pt wants to know if Dr. Madilyn Fireman would like to order some labs (sent to [facility]) for evaluation sooner. Please advise.

## 2014-12-03 NOTE — ED Notes (Signed)
NP at bedside for pelvic

## 2014-12-03 NOTE — Discharge Instructions (Signed)
Follow up with PCP regarding outpatient counseling as discussed. If cultures positive and further treatment required you will be notified.

## 2014-12-03 NOTE — ED Notes (Signed)
Patient preparing for discharge. 

## 2014-12-03 NOTE — ED Notes (Signed)
Abdominal pain and bloating. Dysuria.
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
## 2014-12-04 DIAGNOSIS — K219 Gastro-esophageal reflux disease without esophagitis: Secondary | ICD-10-CM | POA: Diagnosis not present

## 2014-12-04 DIAGNOSIS — J45909 Unspecified asthma, uncomplicated: Secondary | ICD-10-CM | POA: Diagnosis not present

## 2014-12-04 DIAGNOSIS — E876 Hypokalemia: Secondary | ICD-10-CM | POA: Diagnosis not present

## 2014-12-04 DIAGNOSIS — G35 Multiple sclerosis: Secondary | ICD-10-CM | POA: Diagnosis not present

## 2014-12-04 DIAGNOSIS — I10 Essential (primary) hypertension: Secondary | ICD-10-CM | POA: Diagnosis not present

## 2014-12-04 DIAGNOSIS — R072 Precordial pain: Secondary | ICD-10-CM | POA: Diagnosis not present

## 2014-12-04 DIAGNOSIS — Z79899 Other long term (current) drug therapy: Secondary | ICD-10-CM | POA: Diagnosis not present

## 2014-12-04 DIAGNOSIS — M62838 Other muscle spasm: Secondary | ICD-10-CM | POA: Diagnosis not present

## 2014-12-04 DIAGNOSIS — Z85858 Personal history of malignant neoplasm of other endocrine glands: Secondary | ICD-10-CM | POA: Diagnosis not present

## 2014-12-04 DIAGNOSIS — Z8542 Personal history of malignant neoplasm of other parts of uterus: Secondary | ICD-10-CM | POA: Diagnosis not present

## 2014-12-04 DIAGNOSIS — M62831 Muscle spasm of calf: Secondary | ICD-10-CM | POA: Diagnosis not present

## 2014-12-04 LAB — HEPATIC FUNCTION PANEL
ALT: 28 U/L (ref 7–35)
AST: 16 U/L (ref 13–35)
Alkaline Phosphatase: 96 U/L (ref 25–125)
Bilirubin, Total: 0.2 mg/dL

## 2014-12-04 LAB — BASIC METABOLIC PANEL
BUN: 14 mg/dL (ref 4–21)
Creatinine: 0.5 mg/dL (ref 0.5–1.1)
Glucose: 134 mg/dL
Potassium: 3.5 mmol/L (ref 3.4–5.3)
Sodium: 146 mmol/L (ref 137–147)

## 2014-12-04 LAB — GC/CHLAMYDIA PROBE AMP (~~LOC~~) NOT AT ARMC
Chlamydia: NEGATIVE
Neisseria Gonorrhea: NEGATIVE

## 2014-12-04 LAB — CBC AND DIFFERENTIAL
HCT: 42 % (ref 36–46)
Hemoglobin: 13.7 g/dL (ref 12.0–16.0)
Platelets: 210 10*3/uL (ref 150–399)
WBC: 6.4 10^3/mL

## 2014-12-04 LAB — RPR: RPR Ser Ql: NONREACTIVE

## 2014-12-04 LAB — HIV ANTIBODY (ROUTINE TESTING W REFLEX): HIV Screen 4th Generation wRfx: NONREACTIVE

## 2014-12-04 NOTE — Telephone Encounter (Signed)
Lab orders faxed to [facility] at (F): [phone] attn Judeen Hammans [place]. They will get Pt to sign release so records can be sent here after visit.

## 2014-12-04 NOTE — Telephone Encounter (Signed)
Spoke with Pt, who stated her symptoms were were and now she was having some discomfort in her chest. Advised Pt to go to ED and be evaluated and the lab work could be completed there. Verbalized understanding stating she would go to [facility] ED since it is closer to her house. Advised this was fine and to fill out a consent so her record from today's visit can be faxed to [place]. No further questions.

## 2014-12-04 NOTE — Telephone Encounter (Signed)
Okay to order BMP and magnesium level.

## 2014-12-07 DIAGNOSIS — E876 Hypokalemia: Secondary | ICD-10-CM | POA: Diagnosis not present

## 2014-12-07 LAB — BASIC METABOLIC PANEL
BUN: 11 mg/dL (ref 4–21)
Creatinine: 0.5 mg/dL (ref 0.5–1.1)
Glucose: 98 mg/dL
Potassium: 3.8 mmol/L (ref 3.4–5.3)
Sodium: 140 mmol/L (ref 137–147)

## 2014-12-08 DIAGNOSIS — R490 Dysphonia: Secondary | ICD-10-CM | POA: Diagnosis not present

## 2014-12-08 DIAGNOSIS — Z5189 Encounter for other specified aftercare: Secondary | ICD-10-CM | POA: Diagnosis not present

## 2014-12-09 ENCOUNTER — Ambulatory Visit: Payer: Medicare Other | Admitting: Family Medicine

## 2014-12-11 ENCOUNTER — Telehealth: Payer: Self-pay | Admitting: Family Medicine

## 2014-12-11 NOTE — Telephone Encounter (Signed)
lvm informing pt of results.Jennifer [name]  

## 2014-12-11 NOTE — Telephone Encounter (Signed)
Please call patient: Electrolytes look great. Kidney function is stable.

## 2014-12-15 DIAGNOSIS — R1310 Dysphagia, unspecified: Secondary | ICD-10-CM | POA: Diagnosis not present

## 2014-12-17 ENCOUNTER — Encounter: Payer: Self-pay | Admitting: Family Medicine

## 2014-12-17 ENCOUNTER — Other Ambulatory Visit: Payer: Self-pay | Admitting: *Deleted

## 2014-12-17 DIAGNOSIS — R0789 Other chest pain: Secondary | ICD-10-CM | POA: Diagnosis not present

## 2014-12-17 DIAGNOSIS — Z79899 Other long term (current) drug therapy: Secondary | ICD-10-CM | POA: Diagnosis not present

## 2014-12-17 DIAGNOSIS — Z1159 Encounter for screening for other viral diseases: Secondary | ICD-10-CM

## 2014-12-17 DIAGNOSIS — N644 Mastodynia: Secondary | ICD-10-CM

## 2014-12-17 DIAGNOSIS — G501 Atypical facial pain: Secondary | ICD-10-CM | POA: Diagnosis not present

## 2014-12-17 DIAGNOSIS — I69998 Other sequelae following unspecified cerebrovascular disease: Secondary | ICD-10-CM | POA: Diagnosis not present

## 2014-12-17 DIAGNOSIS — M79621 Pain in right upper arm: Secondary | ICD-10-CM

## 2014-12-17 DIAGNOSIS — Z87828 Personal history of other (healed) physical injury and trauma: Secondary | ICD-10-CM

## 2014-12-17 DIAGNOSIS — J45909 Unspecified asthma, uncomplicated: Secondary | ICD-10-CM | POA: Diagnosis not present

## 2014-12-17 DIAGNOSIS — Z711 Person with feared health complaint in whom no diagnosis is made: Secondary | ICD-10-CM | POA: Diagnosis not present

## 2014-12-17 DIAGNOSIS — Z8673 Personal history of transient ischemic attack (TIA), and cerebral infarction without residual deficits: Secondary | ICD-10-CM | POA: Diagnosis not present

## 2014-12-17 DIAGNOSIS — K219 Gastro-esophageal reflux disease without esophagitis: Secondary | ICD-10-CM | POA: Diagnosis not present

## 2014-12-17 DIAGNOSIS — E876 Hypokalemia: Secondary | ICD-10-CM

## 2014-12-17 DIAGNOSIS — E785 Hyperlipidemia, unspecified: Secondary | ICD-10-CM

## 2014-12-17 DIAGNOSIS — G609 Hereditary and idiopathic neuropathy, unspecified: Secondary | ICD-10-CM | POA: Diagnosis not present

## 2014-12-17 DIAGNOSIS — G35 Multiple sclerosis: Secondary | ICD-10-CM | POA: Diagnosis not present

## 2014-12-17 DIAGNOSIS — M25511 Pain in right shoulder: Secondary | ICD-10-CM | POA: Diagnosis not present

## 2014-12-17 DIAGNOSIS — M19011 Primary osteoarthritis, right shoulder: Secondary | ICD-10-CM | POA: Diagnosis not present

## 2014-12-17 DIAGNOSIS — R000 Tachycardia, unspecified: Secondary | ICD-10-CM

## 2014-12-17 DIAGNOSIS — R0982 Postnasal drip: Secondary | ICD-10-CM

## 2014-12-17 DIAGNOSIS — I10 Essential (primary) hypertension: Secondary | ICD-10-CM | POA: Diagnosis not present

## 2014-12-21 DIAGNOSIS — G35 Multiple sclerosis: Secondary | ICD-10-CM | POA: Diagnosis not present

## 2014-12-21 DIAGNOSIS — E876 Hypokalemia: Secondary | ICD-10-CM | POA: Diagnosis not present

## 2014-12-21 DIAGNOSIS — Z8673 Personal history of transient ischemic attack (TIA), and cerebral infarction without residual deficits: Secondary | ICD-10-CM | POA: Diagnosis not present

## 2014-12-21 DIAGNOSIS — Y999 Unspecified external cause status: Secondary | ICD-10-CM | POA: Diagnosis not present

## 2014-12-21 DIAGNOSIS — M7989 Other specified soft tissue disorders: Secondary | ICD-10-CM | POA: Diagnosis not present

## 2014-12-21 DIAGNOSIS — S3991XA Unspecified injury of abdomen, initial encounter: Secondary | ICD-10-CM | POA: Diagnosis not present

## 2014-12-21 DIAGNOSIS — J45909 Unspecified asthma, uncomplicated: Secondary | ICD-10-CM | POA: Diagnosis not present

## 2014-12-21 DIAGNOSIS — Z8542 Personal history of malignant neoplasm of other parts of uterus: Secondary | ICD-10-CM | POA: Diagnosis not present

## 2014-12-21 DIAGNOSIS — I10 Essential (primary) hypertension: Secondary | ICD-10-CM | POA: Diagnosis not present

## 2014-12-21 DIAGNOSIS — M79641 Pain in right hand: Secondary | ICD-10-CM | POA: Diagnosis not present

## 2014-12-21 DIAGNOSIS — S60221A Contusion of right hand, initial encounter: Secondary | ICD-10-CM | POA: Diagnosis not present

## 2014-12-21 LAB — LIPID PANEL
Cholesterol: 167 mg/dL (ref 0–200)
LDL Cholesterol: 103 mg/dL
Triglycerides: 121 mg/dL (ref 40–160)

## 2014-12-21 LAB — BASIC METABOLIC PANEL
Creatinine: 0.5 mg/dL (ref 0.5–1.1)
Glucose: 145 mg/dL
Potassium: 3.6 mmol/L (ref 3.4–5.3)
Sodium: 142 mmol/L (ref 137–147)

## 2014-12-21 LAB — HEPATIC FUNCTION PANEL
ALT: 29 U/L (ref 7–35)
AST: 19 U/L (ref 13–35)
Alkaline Phosphatase: 95 U/L (ref 25–125)

## 2014-12-22 DIAGNOSIS — F330 Major depressive disorder, recurrent, mild: Secondary | ICD-10-CM | POA: Diagnosis not present

## 2014-12-23 ENCOUNTER — Telehealth: Payer: Self-pay | Admitting: *Deleted

## 2014-12-23 NOTE — Telephone Encounter (Addendum)
Pt called and stated that she had an MRI done on her shoulder at [facility], and she had also got labs drawn I informed her that we have not received any of her results as of this point but this does not mean that it has not been faxed over. I did print off her MRI results and placed them in Dr. Gardiner Ramus box for review.[name] [place]

## 2014-12-24 ENCOUNTER — Ambulatory Visit: Payer: Medicare Other | Admitting: Licensed Clinical Social Worker

## 2014-12-24 DIAGNOSIS — I471 Supraventricular tachycardia: Secondary | ICD-10-CM | POA: Diagnosis not present

## 2014-12-24 DIAGNOSIS — R569 Unspecified convulsions: Secondary | ICD-10-CM | POA: Diagnosis not present

## 2014-12-24 DIAGNOSIS — J45909 Unspecified asthma, uncomplicated: Secondary | ICD-10-CM | POA: Diagnosis not present

## 2014-12-24 DIAGNOSIS — G35 Multiple sclerosis: Secondary | ICD-10-CM | POA: Diagnosis not present

## 2014-12-24 DIAGNOSIS — M549 Dorsalgia, unspecified: Secondary | ICD-10-CM | POA: Diagnosis not present

## 2014-12-24 DIAGNOSIS — Z8542 Personal history of malignant neoplasm of other parts of uterus: Secondary | ICD-10-CM | POA: Diagnosis not present

## 2014-12-24 DIAGNOSIS — D509 Iron deficiency anemia, unspecified: Secondary | ICD-10-CM | POA: Diagnosis not present

## 2014-12-24 DIAGNOSIS — E785 Hyperlipidemia, unspecified: Secondary | ICD-10-CM | POA: Diagnosis not present

## 2014-12-24 DIAGNOSIS — G44209 Tension-type headache, unspecified, not intractable: Secondary | ICD-10-CM | POA: Diagnosis not present

## 2014-12-24 DIAGNOSIS — Z885 Allergy status to narcotic agent status: Secondary | ICD-10-CM | POA: Diagnosis not present

## 2014-12-24 DIAGNOSIS — R7309 Other abnormal glucose: Secondary | ICD-10-CM | POA: Diagnosis not present

## 2014-12-24 DIAGNOSIS — F419 Anxiety disorder, unspecified: Secondary | ICD-10-CM | POA: Diagnosis not present

## 2014-12-24 DIAGNOSIS — E269 Hyperaldosteronism, unspecified: Secondary | ICD-10-CM | POA: Diagnosis not present

## 2014-12-24 DIAGNOSIS — Z8589 Personal history of malignant neoplasm of other organs and systems: Secondary | ICD-10-CM | POA: Diagnosis not present

## 2014-12-24 DIAGNOSIS — K219 Gastro-esophageal reflux disease without esophagitis: Secondary | ICD-10-CM | POA: Diagnosis not present

## 2014-12-24 DIAGNOSIS — K2270 Barrett's esophagus without dysplasia: Secondary | ICD-10-CM | POA: Diagnosis not present

## 2014-12-24 DIAGNOSIS — G8929 Other chronic pain: Secondary | ICD-10-CM | POA: Diagnosis not present

## 2014-12-24 DIAGNOSIS — I10 Essential (primary) hypertension: Secondary | ICD-10-CM | POA: Diagnosis not present

## 2014-12-24 DIAGNOSIS — R42 Dizziness and giddiness: Secondary | ICD-10-CM | POA: Diagnosis not present

## 2014-12-24 DIAGNOSIS — D071 Carcinoma in situ of vulva: Secondary | ICD-10-CM | POA: Diagnosis not present

## 2014-12-24 DIAGNOSIS — Z87891 Personal history of nicotine dependence: Secondary | ICD-10-CM | POA: Diagnosis not present

## 2014-12-24 DIAGNOSIS — Z880 Allergy status to penicillin: Secondary | ICD-10-CM | POA: Diagnosis not present

## 2014-12-24 DIAGNOSIS — Z882 Allergy status to sulfonamides status: Secondary | ICD-10-CM | POA: Diagnosis not present

## 2014-12-24 DIAGNOSIS — Z888 Allergy status to other drugs, medicaments and biological substances status: Secondary | ICD-10-CM | POA: Diagnosis not present

## 2014-12-24 DIAGNOSIS — E2609 Other primary hyperaldosteronism: Secondary | ICD-10-CM | POA: Diagnosis not present

## 2014-12-24 DIAGNOSIS — Z881 Allergy status to other antibiotic agents status: Secondary | ICD-10-CM | POA: Diagnosis not present

## 2014-12-25 ENCOUNTER — Telehealth: Payer: Self-pay | Admitting: Family Medicine

## 2014-12-25 NOTE — Telephone Encounter (Signed)
Patient returned clinic call, was unable to speak with her. Attempted callback, no answer.

## 2014-12-25 NOTE — Telephone Encounter (Signed)
Attempted to contact Pt to give results, no answer. Left message and callback information.

## 2014-12-25 NOTE — Telephone Encounter (Signed)
Pt called. She wants to know her lab results she  also has questions about working out.

## 2014-12-25 NOTE — Telephone Encounter (Signed)
Please call patient: MRI of right shoulder performed on May 5 shows that the rotator cuff is intact. There is a little bit of trace subacromial bursitis. A little bit of mild before meals joint arthritis. And a possible tiny tear at the posterior labrum. I would be happy to get her in with an orthopedist for further consult or we could consider physical therapy as well.

## 2014-12-28 ENCOUNTER — Other Ambulatory Visit: Payer: Self-pay

## 2014-12-28 ENCOUNTER — Emergency Department (HOSPITAL_BASED_OUTPATIENT_CLINIC_OR_DEPARTMENT_OTHER)
Admission: EM | Admit: 2014-12-28 | Discharge: 2014-12-28 | Disposition: A | Discharge status: Home / Self Care | Payer: Medicare Other | Attending: Emergency Medicine | Admitting: Emergency Medicine

## 2014-12-28 ENCOUNTER — Encounter (HOSPITAL_BASED_OUTPATIENT_CLINIC_OR_DEPARTMENT_OTHER): Payer: Self-pay | Admitting: Emergency Medicine

## 2014-12-28 DIAGNOSIS — Z8659 Personal history of other mental and behavioral disorders: Secondary | ICD-10-CM | POA: Insufficient documentation

## 2014-12-28 DIAGNOSIS — K219 Gastro-esophageal reflux disease without esophagitis: Secondary | ICD-10-CM | POA: Diagnosis not present

## 2014-12-28 DIAGNOSIS — G4730 Sleep apnea, unspecified: Secondary | ICD-10-CM | POA: Diagnosis not present

## 2014-12-28 DIAGNOSIS — I10 Essential (primary) hypertension: Secondary | ICD-10-CM | POA: Insufficient documentation

## 2014-12-28 DIAGNOSIS — R5383 Other fatigue: Secondary | ICD-10-CM | POA: Insufficient documentation

## 2014-12-28 DIAGNOSIS — Z862 Personal history of diseases of the blood and blood-forming organs and certain disorders involving the immune mechanism: Secondary | ICD-10-CM | POA: Insufficient documentation

## 2014-12-28 DIAGNOSIS — Z8639 Personal history of other endocrine, nutritional and metabolic disease: Secondary | ICD-10-CM | POA: Insufficient documentation

## 2014-12-28 DIAGNOSIS — J45909 Unspecified asthma, uncomplicated: Secondary | ICD-10-CM | POA: Insufficient documentation

## 2014-12-28 DIAGNOSIS — Z79899 Other long term (current) drug therapy: Secondary | ICD-10-CM | POA: Insufficient documentation

## 2014-12-28 DIAGNOSIS — Z9889 Other specified postprocedural states: Secondary | ICD-10-CM | POA: Insufficient documentation

## 2014-12-28 DIAGNOSIS — Z792 Long term (current) use of antibiotics: Secondary | ICD-10-CM | POA: Insufficient documentation

## 2014-12-28 DIAGNOSIS — Z9981 Dependence on supplemental oxygen: Secondary | ICD-10-CM | POA: Diagnosis not present

## 2014-12-28 DIAGNOSIS — R002 Palpitations: Secondary | ICD-10-CM | POA: Diagnosis not present

## 2014-12-28 DIAGNOSIS — Z880 Allergy status to penicillin: Secondary | ICD-10-CM | POA: Insufficient documentation

## 2014-12-28 DIAGNOSIS — Z7952 Long term (current) use of systemic steroids: Secondary | ICD-10-CM | POA: Diagnosis not present

## 2014-12-28 DIAGNOSIS — Z8614 Personal history of Methicillin resistant Staphylococcus aureus infection: Secondary | ICD-10-CM | POA: Diagnosis not present

## 2014-12-28 DIAGNOSIS — Z8542 Personal history of malignant neoplasm of other parts of uterus: Secondary | ICD-10-CM | POA: Insufficient documentation

## 2014-12-28 DIAGNOSIS — Z87891 Personal history of nicotine dependence: Secondary | ICD-10-CM | POA: Diagnosis not present

## 2014-12-28 DIAGNOSIS — Z8544 Personal history of malignant neoplasm of other female genital organs: Secondary | ICD-10-CM | POA: Diagnosis not present

## 2014-12-28 LAB — BASIC METABOLIC PANEL
Anion gap: 7 (ref 5–15)
BUN: 13 mg/dL (ref 6–20)
CO2: 25 mmol/L (ref 22–32)
Calcium: 9 mg/dL (ref 8.9–10.3)
Chloride: 108 mmol/L (ref 101–111)
Creatinine, Ser: 0.56 mg/dL (ref 0.44–1.00)
GFR calc Af Amer: >60 mL/min (ref >60)
GFR calc non Af Amer: >60 mL/min (ref >60)
Glucose, Bld: 137 mg/dL — ABNORMAL HIGH (ref 65–99)
Potassium: 3.5 mmol/L (ref 3.5–5.1)
Sodium: 140 mmol/L (ref 135–145)

## 2014-12-28 NOTE — Discharge Instructions (Signed)
As discussed, your evaluation today has been largely reassuring.  But, it is important that you monitor your condition carefully, and do not hesitate to return to the ED if you develop new, or concerning changes in your condition. ? ?Otherwise, please follow-up with your physician for appropriate ongoing care. ? ?

## 2014-12-28 NOTE — ED Notes (Signed)
Pt states she has been exercising more lately.  Pt states she is having more palpitations lately.  Pt concerned she has been feeling hot, stomach has been bothering her.
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
## 2014-12-29 ENCOUNTER — Encounter: Payer: Self-pay | Admitting: Family Medicine

## 2014-12-29 NOTE — Telephone Encounter (Signed)
Please call pt: cholesterol looks great!  No acute infection with HSV

## 2014-12-31 ENCOUNTER — Encounter: Payer: Self-pay | Admitting: Family Medicine

## 2014-12-31 ENCOUNTER — Ambulatory Visit (INDEPENDENT_AMBULATORY_CARE_PROVIDER_SITE_OTHER): Payer: Medicare Other | Admitting: Family Medicine

## 2014-12-31 VITALS — BP 141/90 | HR 100 | Ht 62.0 in | Wt 193.0 lb

## 2014-12-31 DIAGNOSIS — M25561 Pain in right knee: Secondary | ICD-10-CM | POA: Diagnosis not present

## 2014-12-31 DIAGNOSIS — R21 Rash and other nonspecific skin eruption: Secondary | ICD-10-CM

## 2014-12-31 DIAGNOSIS — Z202 Contact with and (suspected) exposure to infections with a predominantly sexual mode of transmission: Secondary | ICD-10-CM

## 2014-12-31 DIAGNOSIS — IMO0001 Reserved for inherently not codable concepts without codable children: Secondary | ICD-10-CM

## 2014-12-31 DIAGNOSIS — E876 Hypokalemia: Secondary | ICD-10-CM | POA: Diagnosis not present

## 2014-12-31 DIAGNOSIS — R002 Palpitations: Secondary | ICD-10-CM

## 2014-12-31 LAB — BASIC METABOLIC PANEL
Creatinine: 0.6 mg/dL (ref 0.5–1.1)
Potassium: 4 mmol/L (ref 3.4–5.3)
Sodium: 140 mmol/L (ref 137–147)

## 2014-12-31 NOTE — Progress Notes (Signed)
   Subjective:    Patient ID: Jennifer [name], female    DOB: 07-08-1966, 49 y.o.   MRN: [id number]  HPI Patient has been to the ED a couple times since I last saw her for vaginal irritation and for palpitations. She has started working out in [employer]. She has been going several days per week.  Noting her right knee popping a lot.  She says it's not painful popping. Does seem to happen when she is exercising. She know she has had palms with that knee before.  Has been snoring a lot.  Hx of borderline sleep apnea.    Went to tanning bed and about 2-3 days in a row and noticed a rash on her belly. She she fels better with a little. Tan.   She decided to really change her lifestyle and habits and history going to the gym a couple weeks ago. She's doing very well. She is mostly doing some weights with some light aerobic activity. She still gets occasionally lightheaded but is usually able to sit down and rest and then feels okay. She's a little frustrated because she's only lost 2 pounds.  She is concerned may have been exposed to an STD.  She had STD testing done. He wants to know if she should be retested at some point.  Review of Systems     Objective:   Physical Exam  Constitutional: She is oriented to person, place, and time. She appears well-developed and well-nourished.  HENT:  Head: Normocephalic and atraumatic.  Cardiovascular: Normal rate, regular rhythm and normal heart sounds.   Pulmonary/Chest: Effort normal and breath sounds normal.  Abdominal:  Scattered petechial rash on the lower abdomen and upper abdomen. There is a little bit more erythema over the lower abdomen. No cracking or peeling or scaling of the skin.  Neurological: She is alert and oriented to person, place, and time.  Skin: Skin is warm and dry.  Psychiatric: She has a normal mood and affect. Her behavior is normal.          Assessment & Plan:  Palpitations -stable. She has had a history of palpitations.  Evidently her recent exercise has increased from. Next  Obesity/BMI 35-I'm very excited that she has really made herself a priority. I'm glad she is going to the gym glad she is down 2 pounds. I think this is fantastic. Encouraged her to continue to work on it and hopefully she can find an exercise partner. This helps keep the motivation up. Next  Right knee popping-could certainly try a neoprene sleeve. He can typically by 1 at [facility] for between $10-$20. This will provide some extra support but was still allow her to flex. If she starts experiencing pain at any point in time then please let me know. Next  Recommend repeat STD screening in about 3 months since she had a possible exposure.  Rash, abdominal wall-possibly from over tanning or from the tanning bed itself. Could be exposure to chemicals on the tanning bed. She noticed this started after about the third time she gone to tan. Consider could be other exposure such as lotions etc. She are has a topical steroid cream at home and encouraged her to use this for the next couple days to see if it improves. If not then please let me know.

## 2014-12-31 NOTE — Telephone Encounter (Signed)
Pt informed at ov.Jennifer [name]

## 2014-12-31 NOTE — Telephone Encounter (Signed)
Pt informed at ov.[name] [facility]

## 2015-01-01 DIAGNOSIS — J301 Allergic rhinitis due to pollen: Secondary | ICD-10-CM | POA: Diagnosis not present

## 2015-01-01 DIAGNOSIS — R1314 Dysphagia, pharyngoesophageal phase: Secondary | ICD-10-CM | POA: Diagnosis not present

## 2015-01-01 DIAGNOSIS — I10 Essential (primary) hypertension: Secondary | ICD-10-CM | POA: Diagnosis not present

## 2015-01-01 DIAGNOSIS — R0989 Other specified symptoms and signs involving the circulatory and respiratory systems: Secondary | ICD-10-CM | POA: Diagnosis not present

## 2015-01-01 DIAGNOSIS — H5319 Other subjective visual disturbances: Secondary | ICD-10-CM | POA: Diagnosis not present

## 2015-01-01 DIAGNOSIS — L740 Miliaria rubra: Secondary | ICD-10-CM | POA: Diagnosis not present

## 2015-01-01 DIAGNOSIS — R05 Cough: Secondary | ICD-10-CM | POA: Diagnosis not present

## 2015-01-01 DIAGNOSIS — Z8673 Personal history of transient ischemic attack (TIA), and cerebral infarction without residual deficits: Secondary | ICD-10-CM | POA: Diagnosis not present

## 2015-01-01 DIAGNOSIS — R51 Headache: Secondary | ICD-10-CM | POA: Diagnosis not present

## 2015-01-01 DIAGNOSIS — G4733 Obstructive sleep apnea (adult) (pediatric): Secondary | ICD-10-CM | POA: Diagnosis not present

## 2015-01-01 LAB — ESTIMATED GFR: EGFR (Non-African Amer.): >90

## 2015-01-01 LAB — BASIC METABOLIC PANEL
BUN: 11 mg/dL (ref 4–21)
Creatinine: 0.5 mg/dL (ref 0.5–1.1)

## 2015-01-01 LAB — CK TOTAL AND CKMB (NOT AT ARMC): CK Total: 79

## 2015-01-05 ENCOUNTER — Encounter: Payer: Self-pay | Admitting: Family Medicine

## 2015-01-05 LAB — CHLORIDE: Chloride: 107 mmol/L

## 2015-01-05 LAB — ESTIMATED GFR: EGFR (Non-African Amer.): >90

## 2015-01-05 LAB — ALBUMIN: Albumin: 3.5

## 2015-01-05 LAB — PROTEIN, TOTAL: Total Protein: 6.9 g/dL

## 2015-01-06 DIAGNOSIS — Z87891 Personal history of nicotine dependence: Secondary | ICD-10-CM | POA: Diagnosis not present

## 2015-01-06 DIAGNOSIS — K219 Gastro-esophageal reflux disease without esophagitis: Secondary | ICD-10-CM | POA: Diagnosis not present

## 2015-01-06 DIAGNOSIS — J45909 Unspecified asthma, uncomplicated: Secondary | ICD-10-CM | POA: Diagnosis not present

## 2015-01-06 DIAGNOSIS — I10 Essential (primary) hypertension: Secondary | ICD-10-CM | POA: Diagnosis not present

## 2015-01-06 DIAGNOSIS — R590 Localized enlarged lymph nodes: Secondary | ICD-10-CM | POA: Diagnosis not present

## 2015-01-07 ENCOUNTER — Encounter (HOSPITAL_BASED_OUTPATIENT_CLINIC_OR_DEPARTMENT_OTHER): Payer: Self-pay

## 2015-01-07 ENCOUNTER — Emergency Department (HOSPITAL_BASED_OUTPATIENT_CLINIC_OR_DEPARTMENT_OTHER)
Admission: EM | Admit: 2015-01-07 | Discharge: 2015-01-07 | Disposition: A | Discharge status: Home / Self Care | Payer: Medicare Other | Attending: Emergency Medicine | Admitting: Emergency Medicine

## 2015-01-07 DIAGNOSIS — S39011A Strain of muscle, fascia and tendon of abdomen, initial encounter: Secondary | ICD-10-CM | POA: Diagnosis not present

## 2015-01-07 DIAGNOSIS — Z8639 Personal history of other endocrine, nutritional and metabolic disease: Secondary | ICD-10-CM | POA: Insufficient documentation

## 2015-01-07 DIAGNOSIS — Z90710 Acquired absence of both cervix and uterus: Secondary | ICD-10-CM | POA: Diagnosis not present

## 2015-01-07 DIAGNOSIS — Z8659 Personal history of other mental and behavioral disorders: Secondary | ICD-10-CM | POA: Diagnosis not present

## 2015-01-07 DIAGNOSIS — R222 Localized swelling, mass and lump, trunk: Secondary | ICD-10-CM | POA: Diagnosis not present

## 2015-01-07 DIAGNOSIS — G4733 Obstructive sleep apnea (adult) (pediatric): Secondary | ICD-10-CM | POA: Diagnosis not present

## 2015-01-07 DIAGNOSIS — Y9289 Other specified places as the place of occurrence of the external cause: Secondary | ICD-10-CM | POA: Insufficient documentation

## 2015-01-07 DIAGNOSIS — Z79899 Other long term (current) drug therapy: Secondary | ICD-10-CM | POA: Diagnosis not present

## 2015-01-07 DIAGNOSIS — Z9049 Acquired absence of other specified parts of digestive tract: Secondary | ICD-10-CM | POA: Diagnosis not present

## 2015-01-07 DIAGNOSIS — Z880 Allergy status to penicillin: Secondary | ICD-10-CM | POA: Insufficient documentation

## 2015-01-07 DIAGNOSIS — Z8614 Personal history of Methicillin resistant Staphylococcus aureus infection: Secondary | ICD-10-CM | POA: Insufficient documentation

## 2015-01-07 DIAGNOSIS — Z8542 Personal history of malignant neoplasm of other parts of uterus: Secondary | ICD-10-CM | POA: Diagnosis not present

## 2015-01-07 DIAGNOSIS — F329 Major depressive disorder, single episode, unspecified: Secondary | ICD-10-CM | POA: Diagnosis not present

## 2015-01-07 DIAGNOSIS — M7989 Other specified soft tissue disorders: Secondary | ICD-10-CM | POA: Diagnosis not present

## 2015-01-07 DIAGNOSIS — Y998 Other external cause status: Secondary | ICD-10-CM | POA: Insufficient documentation

## 2015-01-07 DIAGNOSIS — Z955 Presence of coronary angioplasty implant and graft: Secondary | ICD-10-CM | POA: Diagnosis not present

## 2015-01-07 DIAGNOSIS — F4310 Post-traumatic stress disorder, unspecified: Secondary | ICD-10-CM | POA: Diagnosis not present

## 2015-01-07 DIAGNOSIS — Z8742 Personal history of other diseases of the female genital tract: Secondary | ICD-10-CM | POA: Diagnosis not present

## 2015-01-07 DIAGNOSIS — Z9981 Dependence on supplemental oxygen: Secondary | ICD-10-CM | POA: Insufficient documentation

## 2015-01-07 DIAGNOSIS — K219 Gastro-esophageal reflux disease without esophagitis: Secondary | ICD-10-CM | POA: Insufficient documentation

## 2015-01-07 DIAGNOSIS — Z792 Long term (current) use of antibiotics: Secondary | ICD-10-CM | POA: Insufficient documentation

## 2015-01-07 DIAGNOSIS — G43909 Migraine, unspecified, not intractable, without status migrainosus: Secondary | ICD-10-CM | POA: Diagnosis not present

## 2015-01-07 DIAGNOSIS — Y9389 Activity, other specified: Secondary | ICD-10-CM | POA: Diagnosis not present

## 2015-01-07 DIAGNOSIS — S3991XA Unspecified injury of abdomen, initial encounter: Secondary | ICD-10-CM | POA: Diagnosis present

## 2015-01-07 DIAGNOSIS — G35 Multiple sclerosis: Secondary | ICD-10-CM | POA: Diagnosis not present

## 2015-01-07 DIAGNOSIS — I10 Essential (primary) hypertension: Secondary | ICD-10-CM | POA: Diagnosis not present

## 2015-01-07 DIAGNOSIS — Z862 Personal history of diseases of the blood and blood-forming organs and certain disorders involving the immune mechanism: Secondary | ICD-10-CM | POA: Diagnosis not present

## 2015-01-07 DIAGNOSIS — R600 Localized edema: Secondary | ICD-10-CM | POA: Diagnosis not present

## 2015-01-07 DIAGNOSIS — G4730 Sleep apnea, unspecified: Secondary | ICD-10-CM | POA: Diagnosis not present

## 2015-01-07 DIAGNOSIS — Z87891 Personal history of nicotine dependence: Secondary | ICD-10-CM | POA: Diagnosis not present

## 2015-01-07 DIAGNOSIS — Z9889 Other specified postprocedural states: Secondary | ICD-10-CM | POA: Diagnosis not present

## 2015-01-07 DIAGNOSIS — Z7952 Long term (current) use of systemic steroids: Secondary | ICD-10-CM | POA: Insufficient documentation

## 2015-01-07 DIAGNOSIS — R531 Weakness: Secondary | ICD-10-CM | POA: Diagnosis not present

## 2015-01-07 DIAGNOSIS — J45909 Unspecified asthma, uncomplicated: Secondary | ICD-10-CM | POA: Insufficient documentation

## 2015-01-07 DIAGNOSIS — M79601 Pain in right arm: Secondary | ICD-10-CM | POA: Diagnosis not present

## 2015-01-07 DIAGNOSIS — X58XXXA Exposure to other specified factors, initial encounter: Secondary | ICD-10-CM | POA: Diagnosis not present

## 2015-01-07 DIAGNOSIS — S39001A Unspecified injury of muscle, fascia and tendon of abdomen, initial encounter: Secondary | ICD-10-CM | POA: Diagnosis not present
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
## 2015-01-07 NOTE — Discharge Instructions (Signed)
Ibuprofen 600 mg every 6 hours as needed for pain.  Warm compresses to the area as needed for pain relief.  Follow-up with your primary Dr. if not improving in the next week, and return to the ER if your symptoms significantly worsen or change.   Muscle Strain A muscle strain is an injury that occurs when a muscle is stretched beyond its normal length. Usually a small number of muscle fibers are torn when this happens. Muscle strain is rated in degrees. First-degree strains have the least amount of muscle fiber tearing and pain. Second-degree and third-degree strains have increasingly more tearing and pain.  Usually, recovery from muscle strain takes 1-2 weeks. Complete healing takes 5-6 weeks.  CAUSES  Muscle strain happens when a sudden, violent force placed on a muscle stretches it too far. This may occur with lifting, sports, or a fall.  RISK FACTORS Muscle strain is especially common in athletes.  SIGNS AND SYMPTOMS At the site of the muscle strain, there may be:  Pain.  Bruising.  Swelling.  Difficulty using the muscle due to pain or lack of normal function. DIAGNOSIS  Your health care provider will perform a physical exam and ask about your medical history. TREATMENT  Often, the best treatment for a muscle strain is resting, icing, and applying cold compresses to the injured area.  HOME CARE INSTRUCTIONS   Use the PRICE method of treatment to promote muscle healing during the first 2-3 days after your injury. The PRICE method involves:  Protecting the muscle from being injured again.  Restricting your activity and resting the injured body part.  Icing your injury. To do this, put ice in a plastic bag. Place a towel between your skin and the bag. Then, apply the ice and leave it on from 15-20 minutes each hour. After the third day, switch to moist heat packs.  Apply compression to the injured area with a splint or elastic bandage. Be careful not to wrap it too tightly.  This may interfere with blood circulation or increase swelling.  Elevate the injured body part above the level of your heart as often as you can.  Only take over-the-counter or prescription medicines for pain, discomfort, or fever as directed by your health care provider.  Warming up prior to exercise helps to prevent future muscle strains. SEEK MEDICAL CARE IF:   You have increasing pain or swelling in the injured area.  You have numbness, tingling, or a significant loss of strength in the injured area. MAKE SURE YOU:   Understand these instructions.  Will watch your condition.  Will get help right away if you are not doing well or get worse. Document Released: 07/31/2005 Document Revised: 05/21/2013 Document Reviewed: 02/27/2013 [name] Patient Information 2015 [place], [place]. This information is not intended to replace advice given to you by your health care provider. Make sure you discuss any questions you have with your health care provider.

## 2015-01-07 NOTE — ED Notes (Signed)
MD at bedside. 

## 2015-01-07 NOTE — ED Notes (Signed)
Pt reports working out last night. Reports Right upper abdominal pain since working out. Sts "i think I may have a hernia"

## 2015-01-08 DIAGNOSIS — Z9889 Other specified postprocedural states: Secondary | ICD-10-CM | POA: Diagnosis not present

## 2015-01-08 DIAGNOSIS — R600 Localized edema: Secondary | ICD-10-CM | POA: Diagnosis not present

## 2015-01-08 DIAGNOSIS — R222 Localized swelling, mass and lump, trunk: Secondary | ICD-10-CM | POA: Diagnosis not present

## 2015-01-12 ENCOUNTER — Encounter: Payer: Self-pay | Admitting: Family Medicine

## 2015-01-12 DIAGNOSIS — R220 Localized swelling, mass and lump, head: Secondary | ICD-10-CM | POA: Diagnosis not present

## 2015-01-12 DIAGNOSIS — R196 Halitosis: Secondary | ICD-10-CM | POA: Diagnosis not present

## 2015-01-13 ENCOUNTER — Encounter: Payer: Self-pay | Admitting: Family Medicine

## 2015-01-15 IMAGING — CR DG CHEST 2V
2 series · 2 of 2 positions shown · non-contrast
Comparison: none

[w chest pa]
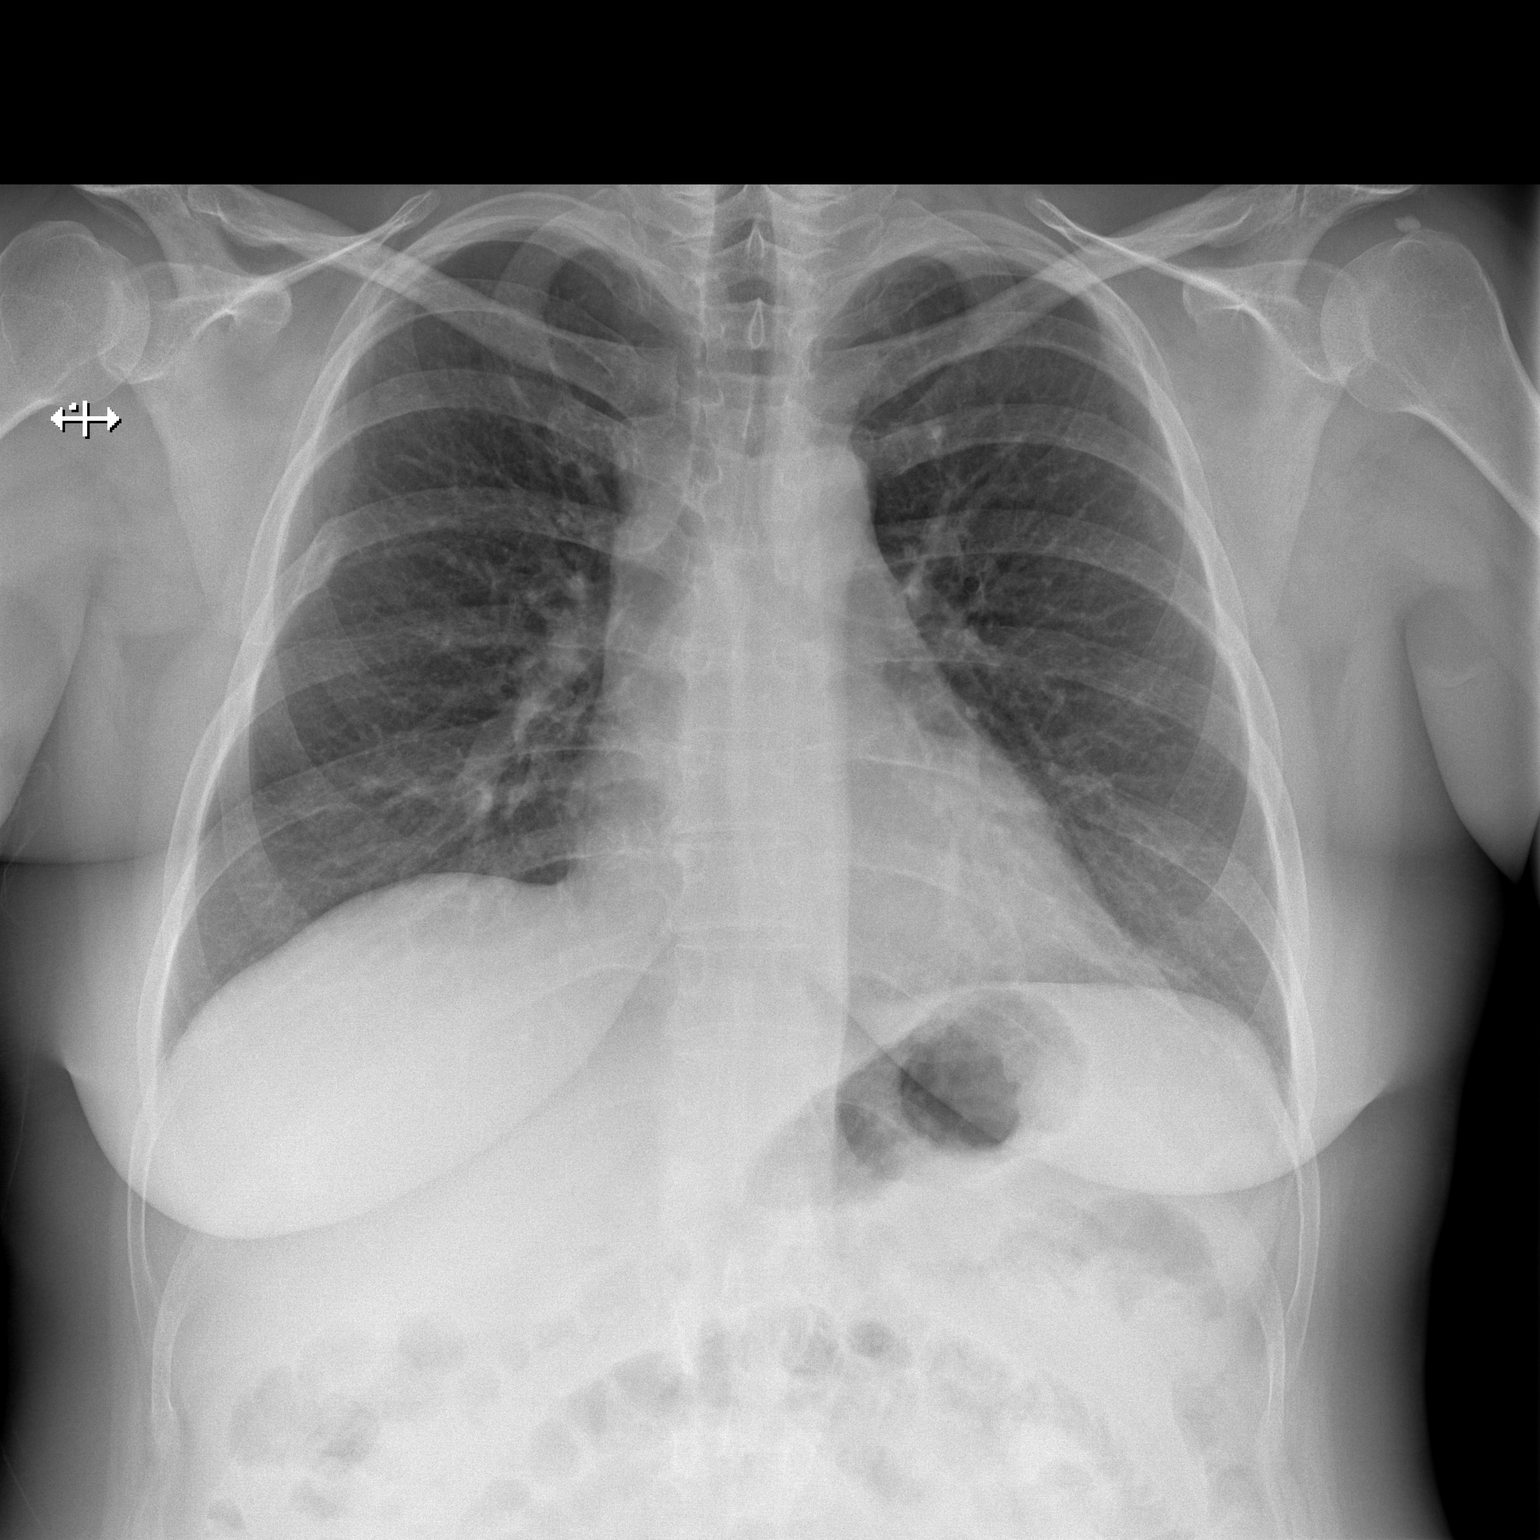

[w chest lat]
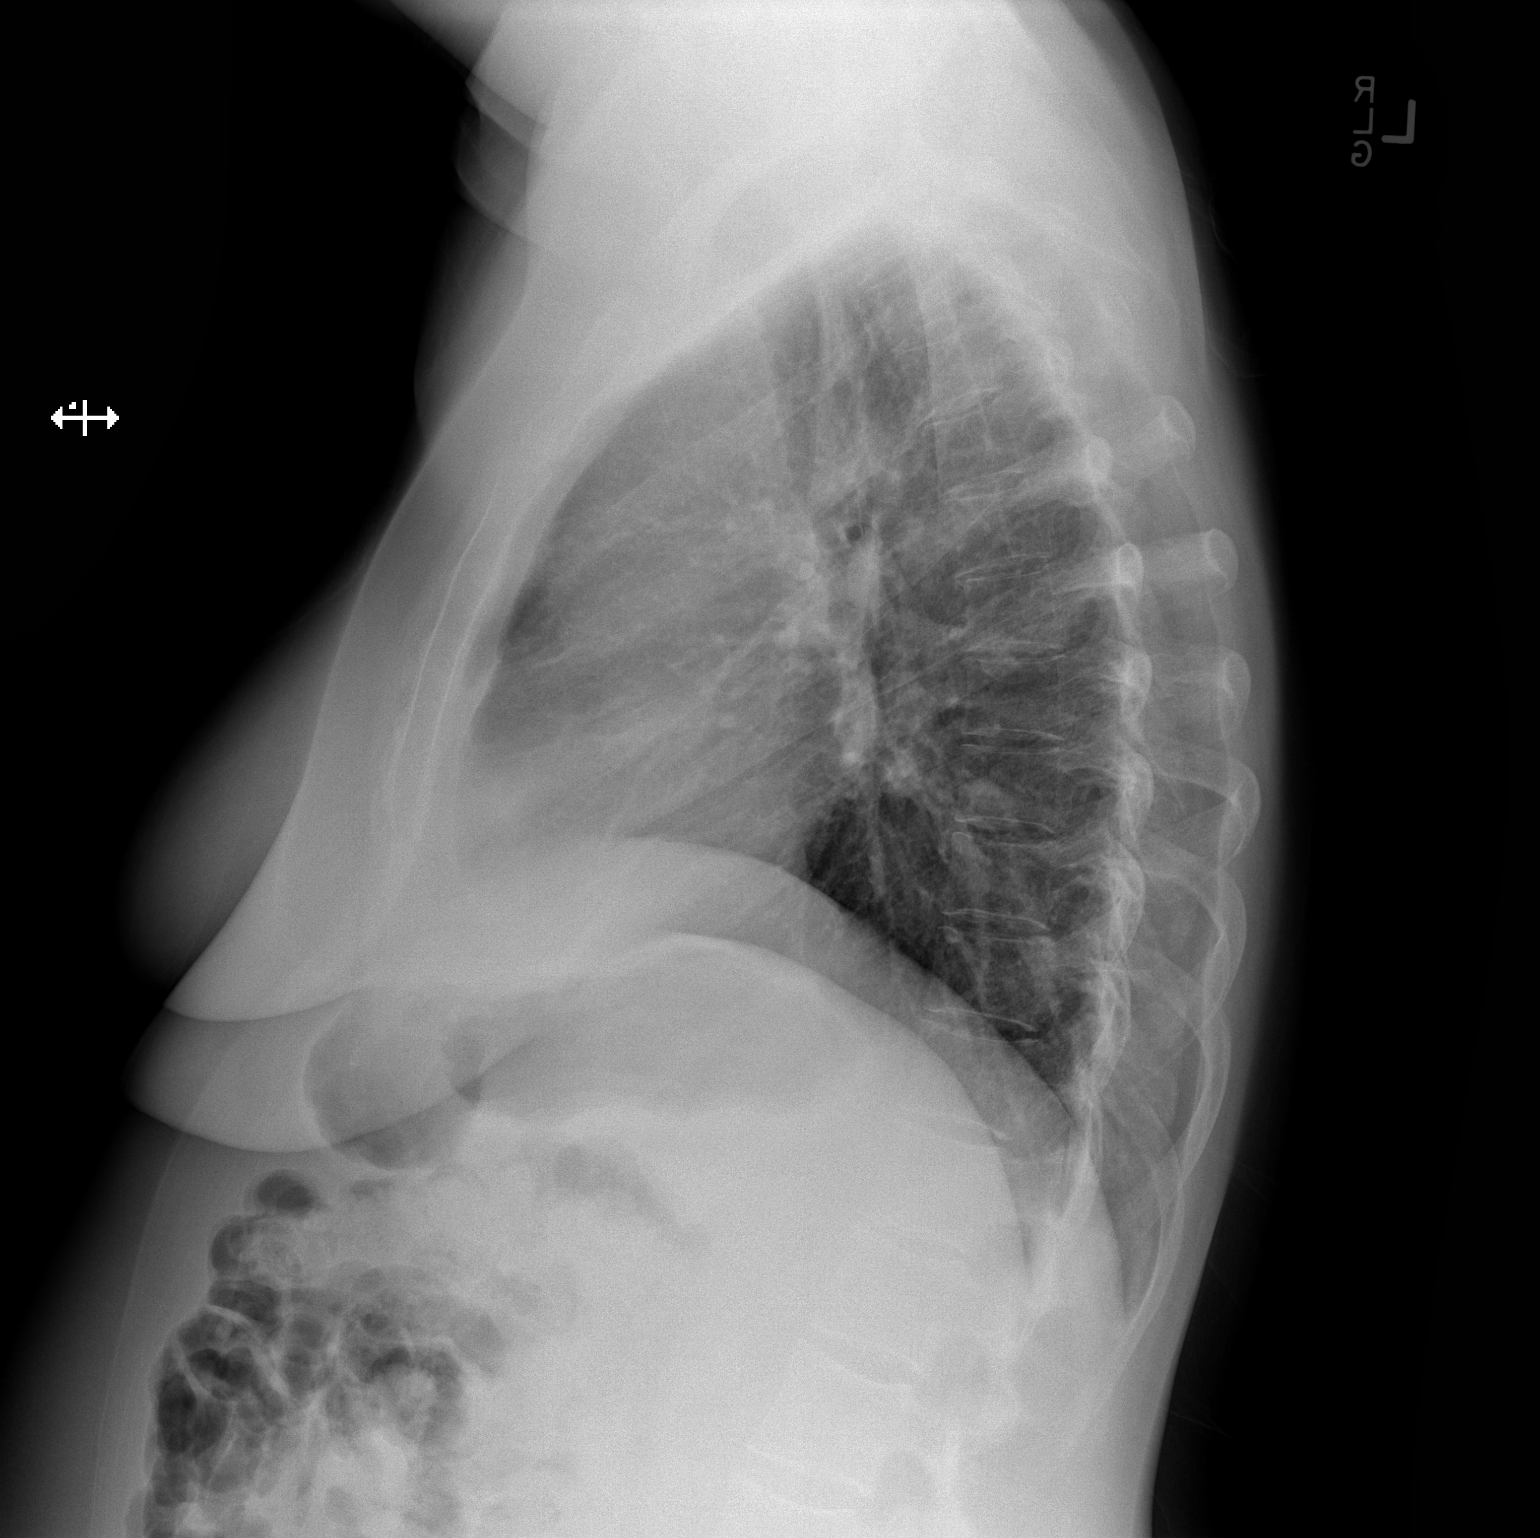

[2 of 2 positions shown; findings below may reference images not displayed]

CLINICAL DATA
Sore throat

EXAM
CHEST  2 VIEW

COMPARISON
10/15/2013

FINDINGS
The heart size and mediastinal contours are within normal limits.
Both lungs are clear. The visualized skeletal structures are
unremarkable.

IMPRESSION
No active cardiopulmonary disease.

SIGNATURE

## 2015-01-15 IMAGING — CT CT NECK W/ CM
2 series · 10 of 14 positions shown, 12 images · IV contrast (OMNIPAQUE 300)
Comparison: none

[Series 2: neck with st · axial · 0.39mm/px · z∈[-240,-120]mm · 4 of 102 slices shown]
[im 21/102  bone]
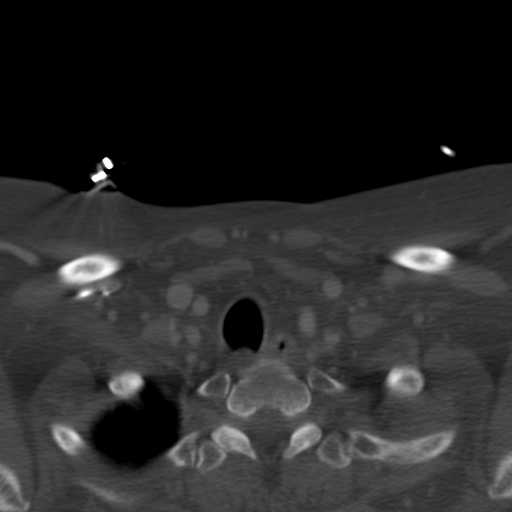
[im 41/102  bone]
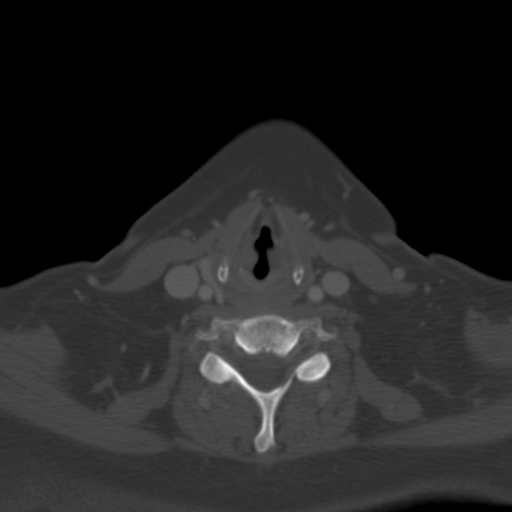
[im 61/102  bone]
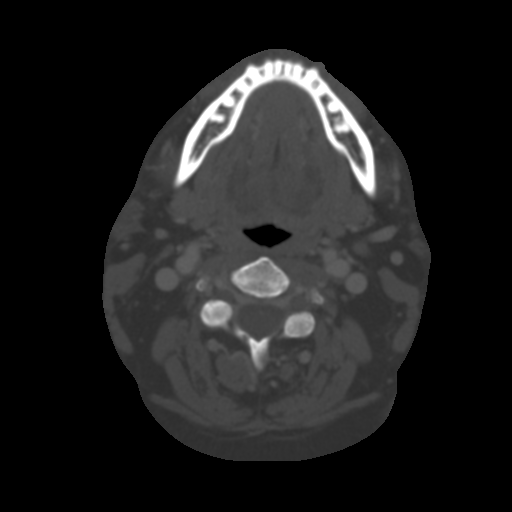
[im 81/102  bone]
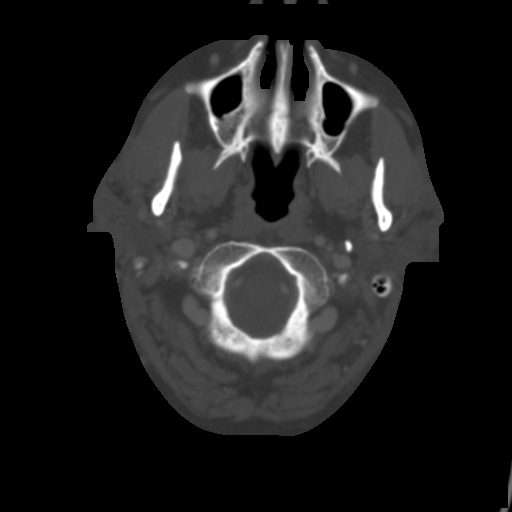

[Series 7: axial recons · axial · 0.39mm/px · z∈[-290,-129]mm · 6 of 120 slices shown, 8 images]
[im 18/120  soft-tissue]
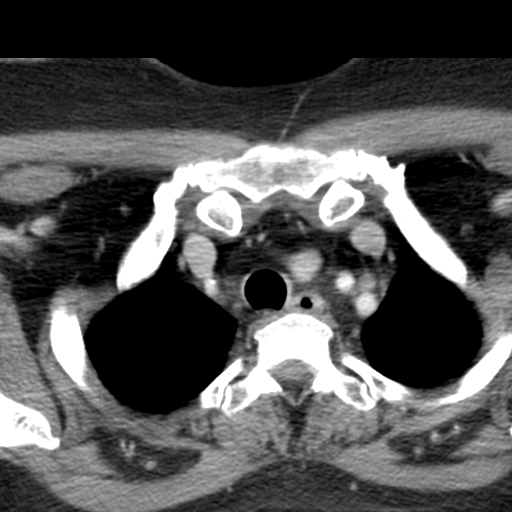
[im 18/120  bone]
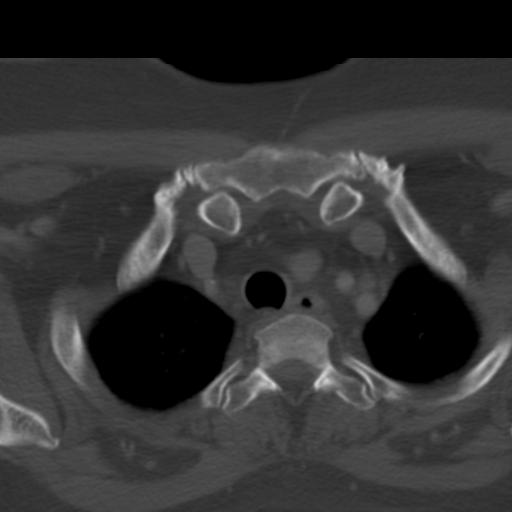
[im 35/120  bone]
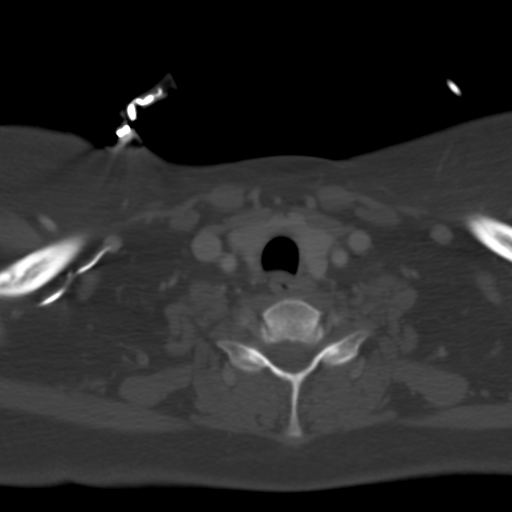
[im 52/120  bone]
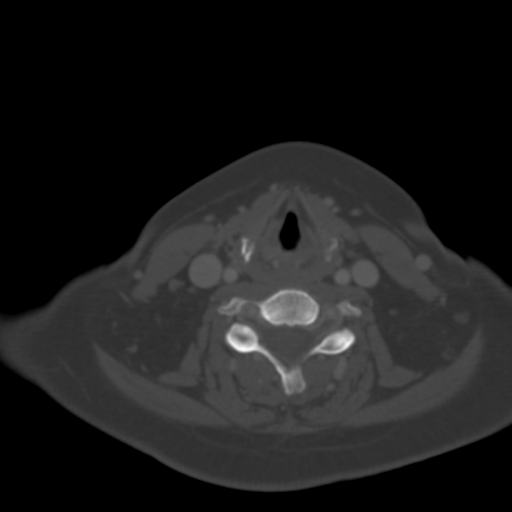
[im 69/120  bone]
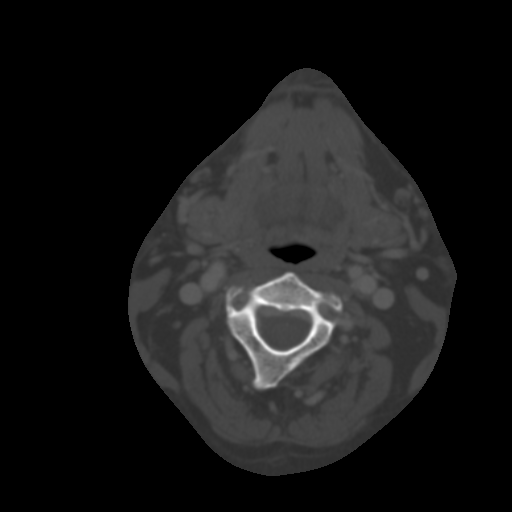
[im 86/120  soft-tissue]
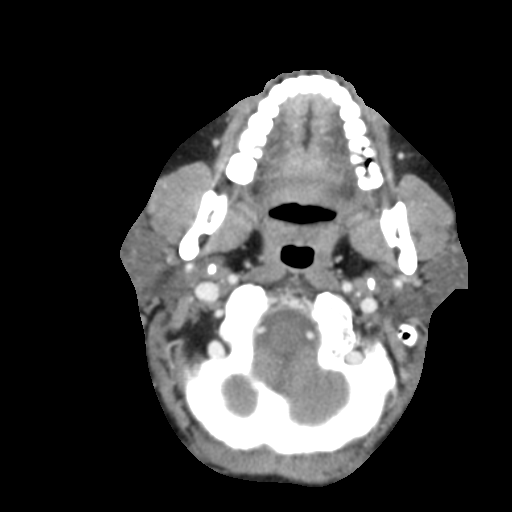
[im 86/120  bone]
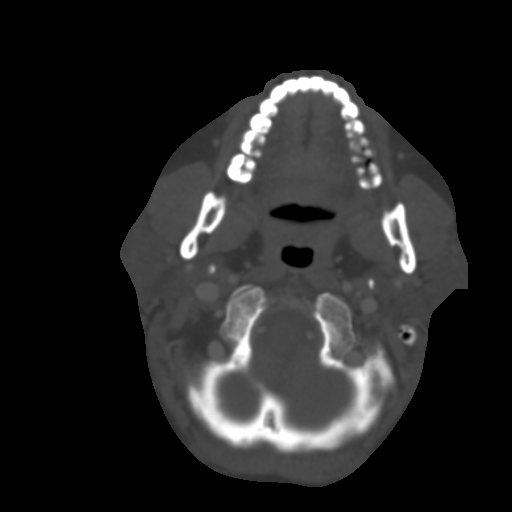
[im 103/120  bone]
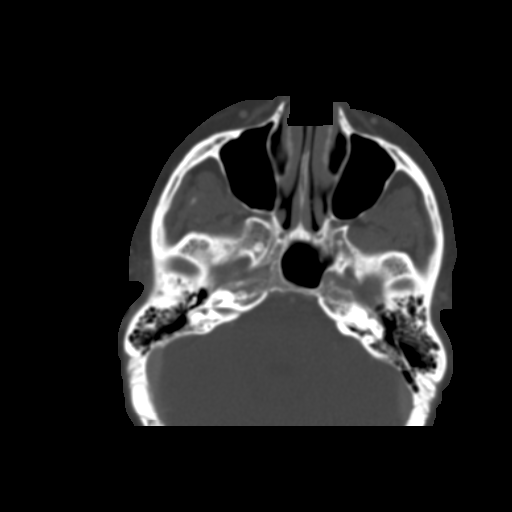

[10 of 14 positions shown; findings below may reference images not displayed]

CLINICAL DATA
Sore throat

EXAM
CT NECK WITH CONTRAST

TECHNIQUE
Multidetector CT imaging of the neck was performed using the
standard protocol following the bolus administration of intravenous
contrast.

CONTRAST
100mL OMNIPAQUE IOHEXOL 300 MG/ML  SOLN

COMPARISON
None.

FINDINGS
The tonsils are normal. No mass or abscess. No evidence of acute
tonsillitis. The tongue base is normal. Airway is normal. Larynx is
normal. Lung apices are clear.

Thyroid is normal bilaterally. Parotid and submandibular glands are
normal.

Negative for mass or adenopathy. Left level 2B lymph node measures 7
mm.

IMPRESSION
Negative

SIGNATURE

## 2015-01-18 DIAGNOSIS — D508 Other iron deficiency anemias: Secondary | ICD-10-CM | POA: Diagnosis not present

## 2015-01-18 DIAGNOSIS — E876 Hypokalemia: Secondary | ICD-10-CM | POA: Diagnosis not present

## 2015-01-18 LAB — CALCIUM
Anion gap: 14
Calcium: 8.8 mg/dL

## 2015-01-18 LAB — BASIC METABOLIC PANEL
BUN: 11 mg/dL (ref 4–21)
Creatinine: 0.6 mg/dL (ref 0.5–1.1)
Glucose: 116 mg/dL
Potassium: 3.8 mmol/L (ref 3.4–5.3)
Sodium: 141 mmol/L (ref 137–147)

## 2015-01-18 LAB — CHLORIDE: Chloride: 100 mmol/L

## 2015-01-18 LAB — ESTIMATED GFR
GFR, Est African American: >90
GFR, Est Non African American: >90

## 2015-01-18 LAB — BUN: BUN/Creatinine Ratio: 18

## 2015-01-18 LAB — CO2, TOTAL: CO2: 27 mmol/L

## 2015-01-20 ENCOUNTER — Telehealth: Payer: Self-pay | Admitting: Family Medicine

## 2015-01-20 DIAGNOSIS — I10 Essential (primary) hypertension: Secondary | ICD-10-CM | POA: Diagnosis not present

## 2015-01-20 DIAGNOSIS — R002 Palpitations: Secondary | ICD-10-CM | POA: Diagnosis not present

## 2015-01-20 DIAGNOSIS — I471 Supraventricular tachycardia: Secondary | ICD-10-CM | POA: Diagnosis not present

## 2015-01-20 DIAGNOSIS — G35 Multiple sclerosis: Secondary | ICD-10-CM | POA: Diagnosis not present

## 2015-01-20 NOTE — Telephone Encounter (Signed)
Call pt: BMP is ok.

## 2015-01-22 ENCOUNTER — Encounter: Payer: Self-pay | Admitting: *Deleted

## 2015-01-22 ENCOUNTER — Emergency Department (INDEPENDENT_AMBULATORY_CARE_PROVIDER_SITE_OTHER)
Admission: EM | Admit: 2015-01-22 | Discharge: 2015-01-22 | Disposition: A | Discharge status: Home / Self Care | Payer: Medicare Other | Source: Home / Self Care | Attending: Family Medicine | Admitting: Family Medicine

## 2015-01-22 DIAGNOSIS — L03115 Cellulitis of right lower limb: Secondary | ICD-10-CM

## 2015-01-22 MED ORDER — MUPIROCIN 2 % EX OINT: TOPICAL_OINTMENT | CUTANEOUS | Status: DC
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
## 2015-01-22 NOTE — ED Notes (Signed)
Pt reports skin tag to right inner thigh is red and inflamed x 2-3 days. C/o pain, denies drainage.

## 2015-01-22 NOTE — Discharge Instructions (Signed)
Keep skin tag covered with bandage, and change daily until healed.   Cellulitis Cellulitis is an infection of the skin and the tissue beneath it. The infected area is usually red and tender. Cellulitis occurs most often in the arms and lower legs.  CAUSES  Cellulitis is caused by bacteria that enter the skin through cracks or cuts in the skin. The most common types of bacteria that cause cellulitis are staphylococci and streptococci. SIGNS AND SYMPTOMS   Redness and warmth.  Swelling.  Tenderness or pain.  Fever. DIAGNOSIS  Your health care provider can usually determine what is wrong based on a physical exam. Blood tests may also be done. TREATMENT  Treatment usually involves taking an antibiotic medicine. HOME CARE INSTRUCTIONS   Take your antibiotic medicine as directed by your health care provider. Finish the antibiotic even if you start to feel better.  Keep the infected arm or leg elevated to reduce swelling.  Apply a warm cloth to the affected area up to 4 times per day to relieve pain.  Take medicines only as directed by your health care provider.  Keep all follow-up visits as directed by your health care provider. SEEK MEDICAL CARE IF:   You notice red streaks coming from the infected area.  Your red area gets larger or turns dark in color.  Your bone or joint underneath the infected area becomes painful after the skin has healed.  Your infection returns in the same area or another area.  You notice a swollen bump in the infected area.  You develop new symptoms.  You have a fever. SEEK IMMEDIATE MEDICAL CARE IF:   You feel very sleepy.  You develop vomiting or diarrhea.  You have a general ill feeling (malaise) with muscle aches and pains. MAKE SURE YOU:   Understand these instructions.  Will watch your condition.  Will get help right away if you are not doing well or get worse. Document Released: 05/10/2005 Document Revised: 12/15/2013 Document  Reviewed: 10/16/2011 [facility] Patient Information 2015 [place], [place]. This information is not intended to replace advice given to you by your health care provider. Make sure you discuss any questions you have with your health care provider.

## 2015-01-25 ENCOUNTER — Encounter (HOSPITAL_BASED_OUTPATIENT_CLINIC_OR_DEPARTMENT_OTHER): Payer: Self-pay | Admitting: Emergency Medicine

## 2015-01-25 ENCOUNTER — Ambulatory Visit (INDEPENDENT_AMBULATORY_CARE_PROVIDER_SITE_OTHER): Payer: Medicare Other | Admitting: Family Medicine

## 2015-01-25 ENCOUNTER — Encounter: Payer: Self-pay | Admitting: Family Medicine

## 2015-01-25 ENCOUNTER — Emergency Department (HOSPITAL_BASED_OUTPATIENT_CLINIC_OR_DEPARTMENT_OTHER)
Admission: EM | Admit: 2015-01-25 | Discharge: 2015-01-25 | Disposition: A | Discharge status: Home / Self Care | Payer: Medicare Other | Attending: Emergency Medicine | Admitting: Emergency Medicine

## 2015-01-25 VITALS — BP 137/90 | HR 96 | Wt 194.0 lb

## 2015-01-25 DIAGNOSIS — R5383 Other fatigue: Secondary | ICD-10-CM | POA: Insufficient documentation

## 2015-01-25 DIAGNOSIS — Z7952 Long term (current) use of systemic steroids: Secondary | ICD-10-CM | POA: Diagnosis not present

## 2015-01-25 DIAGNOSIS — L918 Other hypertrophic disorders of the skin: Secondary | ICD-10-CM | POA: Diagnosis not present

## 2015-01-25 DIAGNOSIS — Z8542 Personal history of malignant neoplasm of other parts of uterus: Secondary | ICD-10-CM | POA: Diagnosis not present

## 2015-01-25 DIAGNOSIS — Z8659 Personal history of other mental and behavioral disorders: Secondary | ICD-10-CM | POA: Insufficient documentation

## 2015-01-25 DIAGNOSIS — R002 Palpitations: Secondary | ICD-10-CM | POA: Insufficient documentation

## 2015-01-25 DIAGNOSIS — J45909 Unspecified asthma, uncomplicated: Secondary | ICD-10-CM | POA: Insufficient documentation

## 2015-01-25 DIAGNOSIS — Z862 Personal history of diseases of the blood and blood-forming organs and certain disorders involving the immune mechanism: Secondary | ICD-10-CM | POA: Insufficient documentation

## 2015-01-25 DIAGNOSIS — R928 Other abnormal and inconclusive findings on diagnostic imaging of breast: Secondary | ICD-10-CM

## 2015-01-25 DIAGNOSIS — R635 Abnormal weight gain: Secondary | ICD-10-CM | POA: Diagnosis not present

## 2015-01-25 DIAGNOSIS — Z9981 Dependence on supplemental oxygen: Secondary | ICD-10-CM | POA: Insufficient documentation

## 2015-01-25 DIAGNOSIS — Z79899 Other long term (current) drug therapy: Secondary | ICD-10-CM | POA: Insufficient documentation

## 2015-01-25 DIAGNOSIS — Z8639 Personal history of other endocrine, nutritional and metabolic disease: Secondary | ICD-10-CM | POA: Diagnosis not present

## 2015-01-25 DIAGNOSIS — Z87891 Personal history of nicotine dependence: Secondary | ICD-10-CM | POA: Insufficient documentation

## 2015-01-25 DIAGNOSIS — Z880 Allergy status to penicillin: Secondary | ICD-10-CM | POA: Insufficient documentation

## 2015-01-25 DIAGNOSIS — L089 Local infection of the skin and subcutaneous tissue, unspecified: Secondary | ICD-10-CM

## 2015-01-25 DIAGNOSIS — R109 Unspecified abdominal pain: Secondary | ICD-10-CM | POA: Diagnosis not present

## 2015-01-25 DIAGNOSIS — K219 Gastro-esophageal reflux disease without esophagitis: Secondary | ICD-10-CM | POA: Insufficient documentation

## 2015-01-25 DIAGNOSIS — Z8614 Personal history of Methicillin resistant Staphylococcus aureus infection: Secondary | ICD-10-CM | POA: Diagnosis not present

## 2015-01-25 DIAGNOSIS — I10 Essential (primary) hypertension: Secondary | ICD-10-CM | POA: Insufficient documentation

## 2015-01-25 DIAGNOSIS — G4730 Sleep apnea, unspecified: Secondary | ICD-10-CM | POA: Diagnosis not present

## 2015-01-25 DIAGNOSIS — Z9889 Other specified postprocedural states: Secondary | ICD-10-CM | POA: Diagnosis not present

## 2015-01-25 DIAGNOSIS — R079 Chest pain, unspecified: Secondary | ICD-10-CM

## 2015-01-25 LAB — TROPONIN I: Troponin I: <0.03 ng/mL (ref <0.031)
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
## 2015-01-25 NOTE — ED Notes (Signed)
Patient reports "tachycardia".  Reports chest pain and abdominal pain for the past "few days".

## 2015-01-25 NOTE — Progress Notes (Signed)
Subjective:    Patient ID: Jennifer [name], female    DOB: 21-Feb-1966, 49 y.o.   MRN: [id number]  HPI Has an irritated skin tag on right inner thigh. Went to ED and was hoping to have it removed.  Says it was red.  Has been using mupiricin oint ment on it.    Has really been working on weight loss. Says has been trying to drink water.  Has been going ot the gym daily.  Says some days she does better on some days with food choices.  Other days will "blow it out".  has been eating more tuna. Trying to incorporate more veggies and fruits.  She is unhappy bc she hasn't lost weight.  Has been ore bloated.  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    Still having occasional palpitations. She said happened the other day while she was at the gym. Her heart rate went up into the 150s within just a couple minutes of being on the machine. Her cardiologist recommended that she decrease intensity of exercise of her heart rate goes over 150. 80% of her maximal heart rate is around 137,  think this is definitely a reasonable. She also had a meeting with a trainer who helped her set up a workout schedule that she's plans on starting to follow.  Review of Systems     Objective:   Physical Exam  Constitutional: She is oriented to person, place, and time. She appears well-developed and well-nourished.  HENT:  Head: Normocephalic and atraumatic.  Cardiovascular: Normal rate, regular rhythm and normal heart sounds.   Pulmonary/Chest: Effort normal and breath sounds normal.  Neurological: She is alert and oriented to person, place, and time.  Skin: Skin is warm and dry.  Psychiatric: She has a normal mood and affect. Her behavior is normal.          Assessment & Plan:  Inflamed skin tag -  Removed. Patient tolerated well.   Abnormal weight gain. - still recommend My Fitness Pal to help her track calories. I think she has exercise component and has made it very routine for her which is fantastic. She  now has more of a schedule after working with a Clinical research associate. Keep u pthe good work for exercise.  I think the key at this point is really working on caloric intake and becoming more consistent with that. Next  Palpitations. Following with cardiology. He thinks setting her heart rate goal is important. And she can back down on the intensity of the exercise for heart rate crosses 150.  She would also like to start doing her mammograms over at the breast Center in [place] instead of with [facility]. New order placed. She prefers Fridays, as her husband can drive her  Skin Tag Removal Procedure Note  Pre-operative Diagnosis: Classic skin tags (acrochordon)  Post-operative Diagnosis: Classic skin tags (acrochordon)  Locations:right inner thigh  Indications: await pathology  Anesthesia: Lidocaine 1% with epinephrine without added sodium bicarbonate  Procedure Details  The risks (including bleeding and infection) and benefits of the procedure and Verbal informed consent obtained. Using sterile iris scissors, multiple skin tags were snipped off at their bases after cleansing with Betadine.  Bleeding was controlled by pressure.   Findings: sent for pathological exam since patient concerned.   Condition: Stable  Complications: none.  Plan: 1. Instructed to keep the wounds dry and covered for 24-48h and clean thereafter. 2. Warning signs of infection were reviewed.  3. Recommended that the patient use OTC acetaminophen as needed for pain.  4. Return as needed. 5. No alcohol or peroxide on the wound.

## 2015-01-25 NOTE — Discharge Instructions (Signed)
Follow-up with your primary doctor.   Chest Pain (Nonspecific) It is often hard to give a specific diagnosis for the cause of chest pain. There is always a chance that your pain could be related to something serious, such as a heart attack or a blood clot in the lungs. You need to follow up with your health care provider for further evaluation. CAUSES   Heartburn.  Pneumonia or bronchitis.  Anxiety or stress.  Inflammation around your heart (pericarditis) or lung (pleuritis or pleurisy).  A blood clot in the lung.  A collapsed lung (pneumothorax). It can develop suddenly on its own (spontaneous pneumothorax) or from trauma to the chest.  Shingles infection (herpes zoster virus). The chest wall is composed of bones, muscles, and cartilage. Any of these can be the source of the pain.  The bones can be bruised by injury.  The muscles or cartilage can be strained by coughing or overwork.  The cartilage can be affected by inflammation and become sore (costochondritis). DIAGNOSIS  Lab tests or other studies may be needed to find the cause of your pain. Your health care provider may have you take a test called an ambulatory electrocardiogram (ECG). An ECG records your heartbeat patterns over a 24-hour period. You may also have other tests, such as:  Transthoracic echocardiogram (TTE). During echocardiography, sound waves are used to evaluate how blood flows through your heart.  Transesophageal echocardiogram (TEE).  Cardiac monitoring. This allows your health care provider to monitor your heart rate and rhythm in real time.  Holter monitor. This is a portable device that records your heartbeat and can help diagnose heart arrhythmias. It allows your health care provider to track your heart activity for several days, if needed.  Stress tests by exercise or by giving medicine that makes the heart beat faster. TREATMENT   Treatment depends on what may be causing your chest pain.  Treatment may include:  Acid blockers for heartburn.  Anti-inflammatory medicine.  Pain medicine for inflammatory conditions.  Antibiotics if an infection is present.  You may be advised to change lifestyle habits. This includes stopping smoking and avoiding alcohol, caffeine, and chocolate.  You may be advised to keep your head raised (elevated) when sleeping. This reduces the chance of acid going backward from your stomach into your esophagus. Most of the time, nonspecific chest pain will improve within 2-3 days with rest and mild pain medicine.  HOME CARE INSTRUCTIONS   If antibiotics were prescribed, take them as directed. Finish them even if you start to feel better.  For the next few days, avoid physical activities that bring on chest pain. Continue physical activities as directed.  Do not use any tobacco products, including cigarettes, chewing tobacco, or electronic cigarettes.  Avoid drinking alcohol.  Only take medicine as directed by your health care provider.  Follow your health care provider's suggestions for further testing if your chest pain does not go away.  Keep any follow-up appointments you made. If you do not go to an appointment, you could develop lasting (chronic) problems with pain. If there is any problem keeping an appointment, call to reschedule. SEEK MEDICAL CARE IF:   Your chest pain does not go away, even after treatment.  You have a rash with blisters on your chest.  You have a fever. SEEK IMMEDIATE MEDICAL CARE IF:   You have increased chest pain or pain that spreads to your arm, neck, jaw, back, or abdomen.  You have shortness of breath.  You  have an increasing cough, or you cough up blood.  You have severe back or abdominal pain.  You feel nauseous or vomit.  You have severe weakness.  You faint.  You have chills. This is an emergency. Do not wait to see if the pain will go away. Get medical help at once. Call your local emergency  services (911 in [place] Do not drive yourself to the hospital. MAKE SURE YOU:   Understand these instructions.  Will watch your condition.  Will get help right away if you are not doing well or get worse. Document Released: 05/10/2005 Document Revised: 08/05/2013 Document Reviewed: 03/05/2008 [facility] Patient Information 2015 [place], [place]. This information is not intended to replace advice given to you by your health care provider. Make sure you discuss any questions you have with your health care provider.

## 2015-01-26 DIAGNOSIS — F330 Major depressive disorder, recurrent, mild: Secondary | ICD-10-CM | POA: Diagnosis not present

## 2015-01-27 ENCOUNTER — Encounter: Payer: Self-pay | Admitting: Family Medicine

## 2015-01-28 NOTE — Telephone Encounter (Signed)
Pt informed at OV .[name]  

## 2015-02-01 DIAGNOSIS — M545 Low back pain: Secondary | ICD-10-CM | POA: Diagnosis not present

## 2015-02-01 DIAGNOSIS — J45909 Unspecified asthma, uncomplicated: Secondary | ICD-10-CM | POA: Diagnosis not present

## 2015-02-01 DIAGNOSIS — I10 Essential (primary) hypertension: Secondary | ICD-10-CM | POA: Diagnosis not present

## 2015-02-01 DIAGNOSIS — G894 Chronic pain syndrome: Secondary | ICD-10-CM | POA: Diagnosis not present

## 2015-02-01 DIAGNOSIS — R110 Nausea: Secondary | ICD-10-CM | POA: Diagnosis not present

## 2015-02-01 DIAGNOSIS — M549 Dorsalgia, unspecified: Secondary | ICD-10-CM | POA: Diagnosis not present

## 2015-02-01 DIAGNOSIS — M25552 Pain in left hip: Secondary | ICD-10-CM | POA: Diagnosis not present

## 2015-02-01 DIAGNOSIS — G35 Multiple sclerosis: Secondary | ICD-10-CM | POA: Diagnosis not present

## 2015-02-01 LAB — BASIC METABOLIC PANEL
Creatinine: 0.5 mg/dL (ref 0.5–1.1)
Glucose: 94 mg/dL
Potassium: 4 mmol/L (ref 3.4–5.3)
Sodium: 142 mmol/L (ref 137–147)

## 2015-02-01 LAB — ESTIMATED GFR: GFR, Est Non African American: >90

## 2015-02-02 ENCOUNTER — Telehealth: Payer: Self-pay | Admitting: Family Medicine

## 2015-02-02 ENCOUNTER — Encounter: Payer: Self-pay | Admitting: Sports Medicine

## 2015-02-02 ENCOUNTER — Ambulatory Visit (INDEPENDENT_AMBULATORY_CARE_PROVIDER_SITE_OTHER): Payer: Medicare Other

## 2015-02-02 ENCOUNTER — Telehealth: Payer: Self-pay | Admitting: Sports Medicine

## 2015-02-02 ENCOUNTER — Ambulatory Visit (INDEPENDENT_AMBULATORY_CARE_PROVIDER_SITE_OTHER): Payer: Medicare Other | Admitting: Sports Medicine

## 2015-02-02 DIAGNOSIS — M51369 Other intervertebral disc degeneration, lumbar region without mention of lumbar back pain or lower extremity pain: Secondary | ICD-10-CM

## 2015-02-02 DIAGNOSIS — M545 Low back pain: Secondary | ICD-10-CM | POA: Diagnosis not present

## 2015-02-02 DIAGNOSIS — M5136 Other intervertebral disc degeneration, lumbar region: Secondary | ICD-10-CM

## 2015-02-02 DIAGNOSIS — S3992XA Unspecified injury of lower back, initial encounter: Secondary | ICD-10-CM | POA: Diagnosis not present

## 2015-02-02 MED ORDER — CAPSAICIN-MENTHOL 0.025-1.25 % EX PTCH: MEDICATED_PATCH | CUTANEOUS | Status: DC

## 2015-02-02 MED ORDER — IBUPROFEN-FAMOTIDINE 800-26.6 MG PO TABS
1.0000 | ORAL_TABLET | Freq: Three times a day (TID) | ORAL | Status: DC
Start: 2015-02-02 — End: 2015-03-10

## 2015-02-02 NOTE — Telephone Encounter (Signed)
Patient walked-in and request to know if she can still go to the gym or should she wait until the x-ray comes back. Should she wait or can she go an be careful. Please advise. Thanks

## 2015-02-02 NOTE — Assessment & Plan Note (Signed)
Recent fall, this likely represents a simple lumbar strain. She does have some tenderness over the spinous processes, we are going to obtain an x-ray. Duexis per patient request. Rehabilitation exercises. Topical Salonpas patches over-the-counter.

## 2015-02-02 NOTE — Progress Notes (Signed)
  Subjective:    CC: Back pain  HPI: Jennifer [name] is a pleasant 49 year old female, she does have a history of L5-S1 degenerative disc disease with a small annular tear at the L5-S1 level, is at least she slipped and fell in the bathtub, she had pain, minimal in the lower part of her lumbar spine with only axial pain, nothing radicular. She was seen in the emergency department, a urinalysis was done that showed negative. Overall she is improved significantly, no bowel or bladder dysfunction, no saddle numbness.  Past medical history, Surgical history, Family history not pertinant except as noted below, Social history, Allergies, and medications have been entered into the medical record, reviewed, and no changes needed.   Review of Systems: No fevers, chills, night sweats, weight loss, chest pain, or shortness of breath.   Objective:    General: Well Developed, well nourished, and in no acute distress.  Neuro: Alert and oriented x3, extra-ocular muscles intact, sensation grossly intact.  HEENT: Normocephalic, atraumatic, pupils equal round reactive to light, neck supple, no masses, no lymphadenopathy, thyroid nonpalpable.  Skin: Warm and dry, no rashes. Cardiac: Regular rate and rhythm, no murmurs rubs or gallops, no lower extremity edema.  Respiratory: Clear to auscultation bilaterally. Not using accessory muscles, speaking in full sentences. Back Exam:  Inspection: Unremarkable  Motion: Flexion 45 deg, Extension 45 deg, Side Bending to 45 deg bilaterally,  Rotation to 45 deg bilaterally  SLR laying: Negative  XSLR laying: Negative  Palpable tenderness: Minimally tender over the sacroiliac joints. FABER: negative. Sensory change: Gross sensation intact to all lumbar and sacral dermatomes.  Reflexes: 2+ at both patellar tendons, 2+ at achilles tendons, Babinski's downgoing.  Strength at foot  Plantar-flexion: 5/5 Dorsi-flexion: 5/5 Eversion: 5/5 Inversion: 5/5  Leg strength  Quad: 5/5  Hamstring: 5/5 Hip flexor: 5/5 Hip abductors: 5/5  Gait unremarkable.  X-rays personally reviewed and show minimal decrease in disc trace at the L5-S1 level, and well-corticated sacroiliac joints.  Impression and Recommendations:

## 2015-02-02 NOTE — Telephone Encounter (Signed)
Please call patient: Basic metabolic panel performed at [facility] regional looks great.

## 2015-02-02 NOTE — Telephone Encounter (Signed)
X-rays are negative with the exception of known L5-S1 degenerative changes, no restrictions. Avoid positions of deep torso flexion

## 2015-02-03 ENCOUNTER — Other Ambulatory Visit: Payer: Self-pay | Admitting: Family Medicine

## 2015-02-03 ENCOUNTER — Telehealth: Payer: Self-pay | Admitting: Sports Medicine

## 2015-02-03 DIAGNOSIS — K219 Gastro-esophageal reflux disease without esophagitis: Secondary | ICD-10-CM | POA: Diagnosis not present

## 2015-02-03 DIAGNOSIS — S46911A Strain of unspecified muscle, fascia and tendon at shoulder and upper arm level, right arm, initial encounter: Secondary | ICD-10-CM | POA: Diagnosis not present

## 2015-02-03 DIAGNOSIS — S46811A Strain of other muscles, fascia and tendons at shoulder and upper arm level, right arm, initial encounter: Secondary | ICD-10-CM | POA: Diagnosis not present

## 2015-02-03 DIAGNOSIS — Z91018 Allergy to other foods: Secondary | ICD-10-CM | POA: Diagnosis not present

## 2015-02-03 DIAGNOSIS — W1840XA Slipping, tripping and stumbling without falling, unspecified, initial encounter: Secondary | ICD-10-CM | POA: Diagnosis not present

## 2015-02-03 DIAGNOSIS — R928 Other abnormal and inconclusive findings on diagnostic imaging of breast: Secondary | ICD-10-CM

## 2015-02-03 DIAGNOSIS — Z882 Allergy status to sulfonamides status: Secondary | ICD-10-CM | POA: Diagnosis not present

## 2015-02-03 DIAGNOSIS — Z87891 Personal history of nicotine dependence: Secondary | ICD-10-CM | POA: Diagnosis not present

## 2015-02-03 DIAGNOSIS — Z881 Allergy status to other antibiotic agents status: Secondary | ICD-10-CM | POA: Diagnosis not present

## 2015-02-03 DIAGNOSIS — Z885 Allergy status to narcotic agent status: Secondary | ICD-10-CM | POA: Diagnosis not present

## 2015-02-03 DIAGNOSIS — Z880 Allergy status to penicillin: Secondary | ICD-10-CM | POA: Diagnosis not present

## 2015-02-03 DIAGNOSIS — I10 Essential (primary) hypertension: Secondary | ICD-10-CM | POA: Diagnosis not present

## 2015-02-03 DIAGNOSIS — J45909 Unspecified asthma, uncomplicated: Secondary | ICD-10-CM | POA: Diagnosis not present

## 2015-02-03 DIAGNOSIS — Z888 Allergy status to other drugs, medicaments and biological substances status: Secondary | ICD-10-CM | POA: Diagnosis not present

## 2015-02-03 DIAGNOSIS — Z79899 Other long term (current) drug therapy: Secondary | ICD-10-CM | POA: Diagnosis not present

## 2015-02-03 DIAGNOSIS — E785 Hyperlipidemia, unspecified: Secondary | ICD-10-CM | POA: Diagnosis not present

## 2015-02-03 DIAGNOSIS — Z7952 Long term (current) use of systemic steroids: Secondary | ICD-10-CM | POA: Diagnosis not present

## 2015-02-03 DIAGNOSIS — Z8542 Personal history of malignant neoplasm of other parts of uterus: Secondary | ICD-10-CM | POA: Diagnosis not present

## 2015-02-03 DIAGNOSIS — M25511 Pain in right shoulder: Secondary | ICD-10-CM | POA: Diagnosis not present

## 2015-02-03 DIAGNOSIS — R52 Pain, unspecified: Secondary | ICD-10-CM | POA: Diagnosis not present

## 2015-02-03 DIAGNOSIS — S4991XA Unspecified injury of right shoulder and upper arm, initial encounter: Secondary | ICD-10-CM | POA: Diagnosis not present

## 2015-02-03 DIAGNOSIS — R921 Mammographic calcification found on diagnostic imaging of breast: Secondary | ICD-10-CM

## 2015-02-03 DIAGNOSIS — S299XXA Unspecified injury of thorax, initial encounter: Secondary | ICD-10-CM | POA: Diagnosis not present

## 2015-02-03 DIAGNOSIS — G35 Multiple sclerosis: Secondary | ICD-10-CM | POA: Diagnosis not present

## 2015-02-03 DIAGNOSIS — Z792 Long term (current) use of antibiotics: Secondary | ICD-10-CM | POA: Diagnosis not present

## 2015-02-03 DIAGNOSIS — F419 Anxiety disorder, unspecified: Secondary | ICD-10-CM | POA: Diagnosis not present

## 2015-02-03 DIAGNOSIS — Z7951 Long term (current) use of inhaled steroids: Secondary | ICD-10-CM | POA: Diagnosis not present

## 2015-02-03 NOTE — Telephone Encounter (Signed)
Called pt to adv of her results and the phone number listed stated it is not in service or has been disconnected unable to leave a vm.

## 2015-02-03 NOTE — Telephone Encounter (Signed)
Called pt, [phone] is disconnected. This is the only number we have on file.

## 2015-02-03 NOTE — Telephone Encounter (Signed)
Just make sure yall get a good phone number for her next time she comes in.

## 2015-02-04 ENCOUNTER — Encounter: Payer: Self-pay | Admitting: Family Medicine

## 2015-02-09 DIAGNOSIS — R42 Dizziness and giddiness: Secondary | ICD-10-CM | POA: Diagnosis not present

## 2015-02-09 DIAGNOSIS — R109 Unspecified abdominal pain: Secondary | ICD-10-CM | POA: Diagnosis not present

## 2015-02-09 DIAGNOSIS — Z87891 Personal history of nicotine dependence: Secondary | ICD-10-CM | POA: Diagnosis not present

## 2015-02-10 ENCOUNTER — Encounter (HOSPITAL_BASED_OUTPATIENT_CLINIC_OR_DEPARTMENT_OTHER): Payer: Self-pay | Admitting: *Deleted

## 2015-02-10 ENCOUNTER — Emergency Department (HOSPITAL_BASED_OUTPATIENT_CLINIC_OR_DEPARTMENT_OTHER)
Admission: EM | Admit: 2015-02-10 | Discharge: 2015-02-10 | Disposition: A | Discharge status: Home / Self Care | Payer: Medicare Other | Attending: Emergency Medicine | Admitting: Emergency Medicine

## 2015-02-10 DIAGNOSIS — Z7952 Long term (current) use of systemic steroids: Secondary | ICD-10-CM | POA: Insufficient documentation

## 2015-02-10 DIAGNOSIS — Z8544 Personal history of malignant neoplasm of other female genital organs: Secondary | ICD-10-CM | POA: Diagnosis not present

## 2015-02-10 DIAGNOSIS — Z8614 Personal history of Methicillin resistant Staphylococcus aureus infection: Secondary | ICD-10-CM | POA: Insufficient documentation

## 2015-02-10 DIAGNOSIS — Z880 Allergy status to penicillin: Secondary | ICD-10-CM | POA: Insufficient documentation

## 2015-02-10 DIAGNOSIS — Z8639 Personal history of other endocrine, nutritional and metabolic disease: Secondary | ICD-10-CM | POA: Diagnosis not present

## 2015-02-10 DIAGNOSIS — Z862 Personal history of diseases of the blood and blood-forming organs and certain disorders involving the immune mechanism: Secondary | ICD-10-CM | POA: Insufficient documentation

## 2015-02-10 DIAGNOSIS — I10 Essential (primary) hypertension: Secondary | ICD-10-CM

## 2015-02-10 DIAGNOSIS — G4730 Sleep apnea, unspecified: Secondary | ICD-10-CM | POA: Insufficient documentation

## 2015-02-10 DIAGNOSIS — Z9981 Dependence on supplemental oxygen: Secondary | ICD-10-CM | POA: Diagnosis not present

## 2015-02-10 DIAGNOSIS — Z8659 Personal history of other mental and behavioral disorders: Secondary | ICD-10-CM | POA: Insufficient documentation

## 2015-02-10 DIAGNOSIS — Z8542 Personal history of malignant neoplasm of other parts of uterus: Secondary | ICD-10-CM | POA: Insufficient documentation

## 2015-02-10 DIAGNOSIS — K219 Gastro-esophageal reflux disease without esophagitis: Secondary | ICD-10-CM | POA: Insufficient documentation

## 2015-02-10 DIAGNOSIS — Z8742 Personal history of other diseases of the female genital tract: Secondary | ICD-10-CM | POA: Insufficient documentation

## 2015-02-10 DIAGNOSIS — Z8739 Personal history of other diseases of the musculoskeletal system and connective tissue: Secondary | ICD-10-CM | POA: Insufficient documentation

## 2015-02-10 DIAGNOSIS — Z87891 Personal history of nicotine dependence: Secondary | ICD-10-CM | POA: Insufficient documentation

## 2015-02-10 DIAGNOSIS — J45909 Unspecified asthma, uncomplicated: Secondary | ICD-10-CM | POA: Diagnosis not present

## 2015-02-10 DIAGNOSIS — Z791 Long term (current) use of non-steroidal anti-inflammatories (NSAID): Secondary | ICD-10-CM | POA: Insufficient documentation

## 2015-02-10 DIAGNOSIS — Z79899 Other long term (current) drug therapy: Secondary | ICD-10-CM | POA: Diagnosis not present

## 2015-02-10 DIAGNOSIS — Z9889 Other specified postprocedural states: Secondary | ICD-10-CM | POA: Insufficient documentation

## 2015-02-10 LAB — CBC WITH DIFFERENTIAL/PLATELET
Basophils Absolute: 0 10*3/uL (ref 0.0–0.1)
Basophils Relative: 1 % (ref 0–1)
Eosinophils Absolute: 0.1 10*3/uL (ref 0.0–0.7)
Eosinophils Relative: 2 % (ref 0–5)
HCT: 41.1 % (ref 36.0–46.0)
Hemoglobin: 13.3 g/dL (ref 12.0–15.0)
Lymphocytes Relative: 23 % (ref 12–46)
Lymphs Abs: 1.2 10*3/uL (ref 0.7–4.0)
MCH: 29 pg (ref 26.0–34.0)
MCHC: 32.4 g/dL (ref 30.0–36.0)
MCV: 89.5 fL (ref 78.0–100.0)
Monocytes Absolute: 0.5 10*3/uL (ref 0.1–1.0)
Monocytes Relative: 9 % (ref 3–12)
Neutro Abs: 3.6 10*3/uL (ref 1.7–7.7)
Neutrophils Relative %: 65 % (ref 43–77)
Platelets: 208 10*3/uL (ref 150–400)
RBC: 4.59 MIL/uL (ref 3.87–5.11)
RDW: 14 % (ref 11.5–15.5)
WBC: 5.4 10*3/uL (ref 4.0–10.5)

## 2015-02-10 LAB — BASIC METABOLIC PANEL
Anion gap: 9 (ref 5–15)
BUN: 13 mg/dL (ref 6–20)
CO2: 25 mmol/L (ref 22–32)
Calcium: 9 mg/dL (ref 8.9–10.3)
Chloride: 106 mmol/L (ref 101–111)
Creatinine, Ser: 0.65 mg/dL (ref 0.44–1.00)
GFR calc Af Amer: >60 mL/min (ref >60)
GFR calc non Af Amer: >60 mL/min (ref >60)
Glucose, Bld: 117 mg/dL — ABNORMAL HIGH (ref 65–99)
Potassium: 3.6 mmol/L (ref 3.5–5.1)
Sodium: 140 mmol/L (ref 135–145)

## 2015-02-10 LAB — TROPONIN I: Troponin I: <0.03 ng/mL (ref <0.031)

## 2015-02-10 MED ORDER — SODIUM CHLORIDE 0.9 % IV BOLUS (SEPSIS)
1000.0000 mL | Freq: Once | INTRAVENOUS | Status: AC
  Administered 2015-02-10: 1000 mL via INTRAVENOUS

## 2015-02-10 NOTE — ED Notes (Addendum)
Pt c/o increased BP and dizziness x 2 days Seen by [facility] yesterday for same.
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
## 2015-02-10 NOTE — ED Notes (Signed)
np at bedside

## 2015-02-10 NOTE — Discharge Instructions (Signed)

## 2015-02-11 ENCOUNTER — Telehealth: Payer: Self-pay

## 2015-02-11 DIAGNOSIS — F329 Major depressive disorder, single episode, unspecified: Secondary | ICD-10-CM | POA: Diagnosis not present

## 2015-02-11 DIAGNOSIS — I158 Other secondary hypertension: Secondary | ICD-10-CM | POA: Diagnosis not present

## 2015-02-11 DIAGNOSIS — J45909 Unspecified asthma, uncomplicated: Secondary | ICD-10-CM | POA: Diagnosis not present

## 2015-02-11 DIAGNOSIS — I471 Supraventricular tachycardia: Secondary | ICD-10-CM | POA: Diagnosis not present

## 2015-02-11 DIAGNOSIS — F419 Anxiety disorder, unspecified: Secondary | ICD-10-CM | POA: Diagnosis not present

## 2015-02-11 DIAGNOSIS — I159 Secondary hypertension, unspecified: Secondary | ICD-10-CM | POA: Diagnosis not present

## 2015-02-11 DIAGNOSIS — R079 Chest pain, unspecified: Secondary | ICD-10-CM | POA: Diagnosis not present

## 2015-02-11 DIAGNOSIS — Z9861 Coronary angioplasty status: Secondary | ICD-10-CM | POA: Diagnosis not present

## 2015-02-11 DIAGNOSIS — R51 Headache: Secondary | ICD-10-CM | POA: Diagnosis not present

## 2015-02-11 DIAGNOSIS — R5383 Other fatigue: Secondary | ICD-10-CM | POA: Diagnosis not present

## 2015-02-11 DIAGNOSIS — Z87891 Personal history of nicotine dependence: Secondary | ICD-10-CM | POA: Diagnosis not present

## 2015-02-11 DIAGNOSIS — I10 Essential (primary) hypertension: Secondary | ICD-10-CM | POA: Diagnosis not present

## 2015-02-11 DIAGNOSIS — Z8542 Personal history of malignant neoplasm of other parts of uterus: Secondary | ICD-10-CM | POA: Diagnosis not present

## 2015-02-11 DIAGNOSIS — G4733 Obstructive sleep apnea (adult) (pediatric): Secondary | ICD-10-CM | POA: Diagnosis not present

## 2015-02-11 DIAGNOSIS — G35 Multiple sclerosis: Secondary | ICD-10-CM | POA: Diagnosis not present

## 2015-02-11 NOTE — Telephone Encounter (Signed)
She should probably come here for a nurse visit to make sure that she actually has elevated blood pressure before starting medication.

## 2015-02-11 NOTE — Telephone Encounter (Signed)
Elan complains of elevated blood pressure, weakness, headaches, nausea and body aches. 146/94 lying 161/110 sitting 159/114 standing. She went to the ED last night and was given fluids. She has taken Micardis in the past.

## 2015-02-11 NOTE — Telephone Encounter (Signed)
Left detailed message.   

## 2015-02-12 DIAGNOSIS — G894 Chronic pain syndrome: Secondary | ICD-10-CM | POA: Diagnosis not present

## 2015-02-12 DIAGNOSIS — I10 Essential (primary) hypertension: Secondary | ICD-10-CM | POA: Diagnosis not present

## 2015-02-12 DIAGNOSIS — Z87891 Personal history of nicotine dependence: Secondary | ICD-10-CM | POA: Diagnosis not present

## 2015-02-12 DIAGNOSIS — Z79899 Other long term (current) drug therapy: Secondary | ICD-10-CM | POA: Diagnosis not present

## 2015-02-12 DIAGNOSIS — R0782 Intercostal pain: Secondary | ICD-10-CM | POA: Diagnosis not present

## 2015-02-12 DIAGNOSIS — R079 Chest pain, unspecified: Secondary | ICD-10-CM | POA: Diagnosis not present

## 2015-02-12 DIAGNOSIS — G35 Multiple sclerosis: Secondary | ICD-10-CM | POA: Diagnosis not present

## 2015-02-12 DIAGNOSIS — J45909 Unspecified asthma, uncomplicated: Secondary | ICD-10-CM | POA: Diagnosis not present

## 2015-02-14 DIAGNOSIS — D509 Iron deficiency anemia, unspecified: Secondary | ICD-10-CM | POA: Diagnosis not present

## 2015-02-14 DIAGNOSIS — K2270 Barrett's esophagus without dysplasia: Secondary | ICD-10-CM | POA: Diagnosis not present

## 2015-02-14 DIAGNOSIS — Z87891 Personal history of nicotine dependence: Secondary | ICD-10-CM | POA: Diagnosis not present

## 2015-02-14 DIAGNOSIS — Z880 Allergy status to penicillin: Secondary | ICD-10-CM | POA: Diagnosis not present

## 2015-02-14 DIAGNOSIS — Z79899 Other long term (current) drug therapy: Secondary | ICD-10-CM | POA: Diagnosis not present

## 2015-02-14 DIAGNOSIS — I10 Essential (primary) hypertension: Secondary | ICD-10-CM | POA: Diagnosis not present

## 2015-02-14 DIAGNOSIS — E785 Hyperlipidemia, unspecified: Secondary | ICD-10-CM | POA: Diagnosis not present

## 2015-02-14 DIAGNOSIS — Z885 Allergy status to narcotic agent status: Secondary | ICD-10-CM | POA: Diagnosis not present

## 2015-02-14 DIAGNOSIS — Z881 Allergy status to other antibiotic agents status: Secondary | ICD-10-CM | POA: Diagnosis not present

## 2015-02-14 DIAGNOSIS — Z91048 Other nonmedicinal substance allergy status: Secondary | ICD-10-CM | POA: Diagnosis not present

## 2015-02-14 DIAGNOSIS — F419 Anxiety disorder, unspecified: Secondary | ICD-10-CM | POA: Diagnosis not present

## 2015-02-14 DIAGNOSIS — Z882 Allergy status to sulfonamides status: Secondary | ICD-10-CM | POA: Diagnosis not present

## 2015-02-14 DIAGNOSIS — R51 Headache: Secondary | ICD-10-CM | POA: Diagnosis not present

## 2015-02-14 DIAGNOSIS — E269 Hyperaldosteronism, unspecified: Secondary | ICD-10-CM | POA: Diagnosis not present

## 2015-02-14 DIAGNOSIS — Z8542 Personal history of malignant neoplasm of other parts of uterus: Secondary | ICD-10-CM | POA: Diagnosis not present

## 2015-02-14 DIAGNOSIS — Z888 Allergy status to other drugs, medicaments and biological substances status: Secondary | ICD-10-CM | POA: Diagnosis not present

## 2015-02-14 DIAGNOSIS — G35 Multiple sclerosis: Secondary | ICD-10-CM | POA: Diagnosis not present

## 2015-02-15 DIAGNOSIS — R51 Headache: Secondary | ICD-10-CM | POA: Diagnosis not present

## 2015-02-16 ENCOUNTER — Telehealth: Payer: Self-pay

## 2015-02-16 DIAGNOSIS — Z79899 Other long term (current) drug therapy: Secondary | ICD-10-CM | POA: Diagnosis not present

## 2015-02-16 DIAGNOSIS — Z882 Allergy status to sulfonamides status: Secondary | ICD-10-CM | POA: Diagnosis not present

## 2015-02-16 DIAGNOSIS — J309 Allergic rhinitis, unspecified: Secondary | ICD-10-CM | POA: Diagnosis not present

## 2015-02-16 DIAGNOSIS — Z880 Allergy status to penicillin: Secondary | ICD-10-CM | POA: Diagnosis not present

## 2015-02-16 DIAGNOSIS — R0602 Shortness of breath: Secondary | ICD-10-CM | POA: Diagnosis not present

## 2015-02-16 DIAGNOSIS — T447X5A Adverse effect of beta-adrenoreceptor antagonists, initial encounter: Secondary | ICD-10-CM | POA: Diagnosis not present

## 2015-02-16 DIAGNOSIS — J45909 Unspecified asthma, uncomplicated: Secondary | ICD-10-CM | POA: Diagnosis not present

## 2015-02-16 DIAGNOSIS — R002 Palpitations: Secondary | ICD-10-CM | POA: Diagnosis not present

## 2015-02-16 DIAGNOSIS — G35 Multiple sclerosis: Secondary | ICD-10-CM | POA: Diagnosis not present

## 2015-02-16 DIAGNOSIS — T783XXA Angioneurotic edema, initial encounter: Secondary | ICD-10-CM | POA: Diagnosis not present

## 2015-02-16 DIAGNOSIS — T464X5A Adverse effect of angiotensin-converting-enzyme inhibitors, initial encounter: Secondary | ICD-10-CM | POA: Diagnosis not present

## 2015-02-16 DIAGNOSIS — Z87891 Personal history of nicotine dependence: Secondary | ICD-10-CM | POA: Diagnosis not present

## 2015-02-16 DIAGNOSIS — T782XXA Anaphylactic shock, unspecified, initial encounter: Secondary | ICD-10-CM | POA: Diagnosis not present

## 2015-02-16 DIAGNOSIS — I10 Essential (primary) hypertension: Secondary | ICD-10-CM | POA: Diagnosis not present

## 2015-02-16 DIAGNOSIS — G894 Chronic pain syndrome: Secondary | ICD-10-CM | POA: Diagnosis not present

## 2015-02-16 DIAGNOSIS — Z888 Allergy status to other drugs, medicaments and biological substances status: Secondary | ICD-10-CM | POA: Diagnosis not present

## 2015-02-16 DIAGNOSIS — Z885 Allergy status to narcotic agent status: Secondary | ICD-10-CM | POA: Diagnosis not present

## 2015-02-16 NOTE — Telephone Encounter (Signed)
Jennifer [name] has had elevated blood pressure. 160/110-200/1015. She saw cardiology and they gave her Micardis 40 mg samples. This medication did not help bring her blood pressure down. It could have been due to the fact they were expired a year ago. She is wanting something for her elevated blood pressure. She has amlodipine 5 mg at home.

## 2015-02-16 NOTE — Telephone Encounter (Signed)
Patient advised.

## 2015-02-16 NOTE — Telephone Encounter (Signed)
Ok to start amlodipine.

## 2015-02-17 DIAGNOSIS — Z9049 Acquired absence of other specified parts of digestive tract: Secondary | ICD-10-CM | POA: Diagnosis not present

## 2015-02-17 DIAGNOSIS — I10 Essential (primary) hypertension: Secondary | ICD-10-CM | POA: Diagnosis not present

## 2015-02-17 DIAGNOSIS — Z885 Allergy status to narcotic agent status: Secondary | ICD-10-CM | POA: Diagnosis not present

## 2015-02-17 DIAGNOSIS — Z886 Allergy status to analgesic agent status: Secondary | ICD-10-CM | POA: Diagnosis not present

## 2015-02-17 DIAGNOSIS — Z90710 Acquired absence of both cervix and uterus: Secondary | ICD-10-CM | POA: Diagnosis not present

## 2015-02-17 DIAGNOSIS — R079 Chest pain, unspecified: Secondary | ICD-10-CM | POA: Diagnosis not present

## 2015-02-17 DIAGNOSIS — Z8542 Personal history of malignant neoplasm of other parts of uterus: Secondary | ICD-10-CM | POA: Diagnosis not present

## 2015-02-17 DIAGNOSIS — J45909 Unspecified asthma, uncomplicated: Secondary | ICD-10-CM | POA: Diagnosis not present

## 2015-02-17 DIAGNOSIS — T783XXA Angioneurotic edema, initial encounter: Secondary | ICD-10-CM | POA: Diagnosis not present

## 2015-02-17 DIAGNOSIS — E269 Hyperaldosteronism, unspecified: Secondary | ICD-10-CM | POA: Diagnosis not present

## 2015-02-17 DIAGNOSIS — J040 Acute laryngitis: Secondary | ICD-10-CM | POA: Diagnosis not present

## 2015-02-17 DIAGNOSIS — Z883 Allergy status to other anti-infective agents status: Secondary | ICD-10-CM | POA: Diagnosis not present

## 2015-02-17 DIAGNOSIS — R000 Tachycardia, unspecified: Secondary | ICD-10-CM | POA: Diagnosis not present

## 2015-02-17 DIAGNOSIS — Z9851 Tubal ligation status: Secondary | ICD-10-CM | POA: Diagnosis not present

## 2015-02-17 DIAGNOSIS — F419 Anxiety disorder, unspecified: Secondary | ICD-10-CM | POA: Diagnosis not present

## 2015-02-17 DIAGNOSIS — R569 Unspecified convulsions: Secondary | ICD-10-CM | POA: Diagnosis not present

## 2015-02-17 DIAGNOSIS — G35 Multiple sclerosis: Secondary | ICD-10-CM | POA: Diagnosis not present

## 2015-02-17 DIAGNOSIS — G8929 Other chronic pain: Secondary | ICD-10-CM | POA: Diagnosis not present

## 2015-02-17 DIAGNOSIS — M549 Dorsalgia, unspecified: Secondary | ICD-10-CM | POA: Diagnosis not present

## 2015-02-17 DIAGNOSIS — K219 Gastro-esophageal reflux disease without esophagitis: Secondary | ICD-10-CM | POA: Diagnosis not present

## 2015-02-17 DIAGNOSIS — Z881 Allergy status to other antibiotic agents status: Secondary | ICD-10-CM | POA: Diagnosis not present

## 2015-02-17 DIAGNOSIS — R064 Hyperventilation: Secondary | ICD-10-CM | POA: Diagnosis not present

## 2015-02-17 DIAGNOSIS — M8888 Osteitis deformans of other bones: Secondary | ICD-10-CM | POA: Diagnosis not present

## 2015-02-17 DIAGNOSIS — K2270 Barrett's esophagus without dysplasia: Secondary | ICD-10-CM | POA: Diagnosis not present

## 2015-02-17 DIAGNOSIS — R221 Localized swelling, mass and lump, neck: Secondary | ICD-10-CM | POA: Diagnosis not present

## 2015-02-17 DIAGNOSIS — Z87891 Personal history of nicotine dependence: Secondary | ICD-10-CM | POA: Diagnosis not present

## 2015-02-17 DIAGNOSIS — E785 Hyperlipidemia, unspecified: Secondary | ICD-10-CM | POA: Diagnosis not present

## 2015-02-17 IMAGING — US US ABDOMEN COMPLETE
1 series · 13 of 25 positions shown · non-contrast
Comparison: CT ABD/PELVIS W CM dated 08/27/2013; US ABDOMEN COMPLETE
dated 07/07/2013; CT ABD/PELVIS W CM dated 08/06/2013

CLINICAL DATA: Right-sided flank pain.

EXAM:
ULTRASOUND ABDOMEN COMPLETE

[Series 1: us abdomen complete · 0.41mm/px · 13 of 70 slices shown]
[im 1/70]
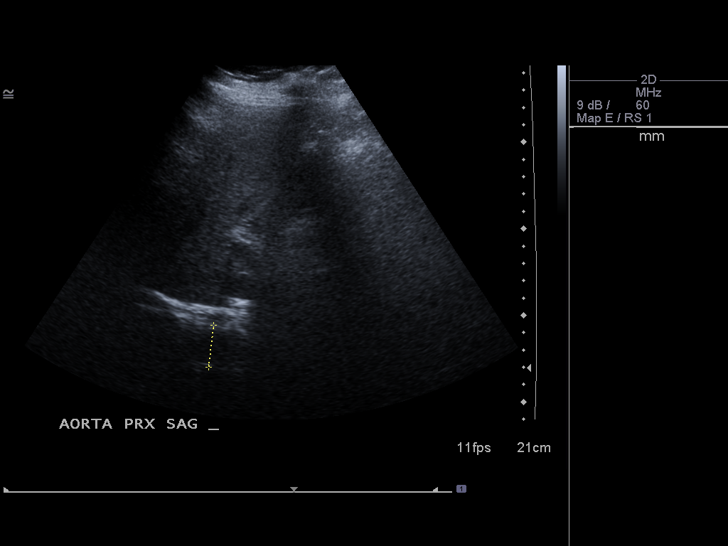
[im 6/70]
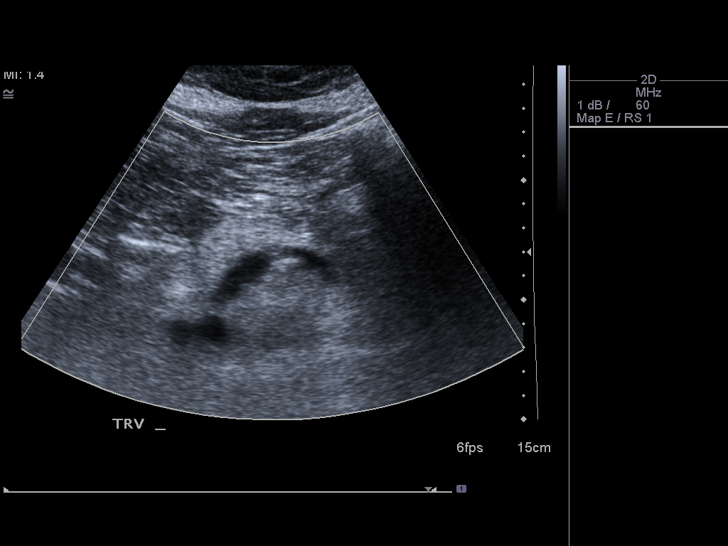
[im 12/70]
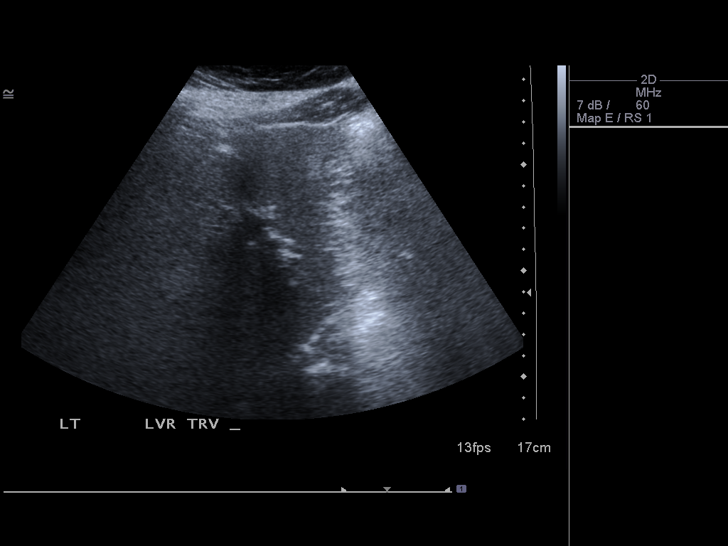
[im 18/70]
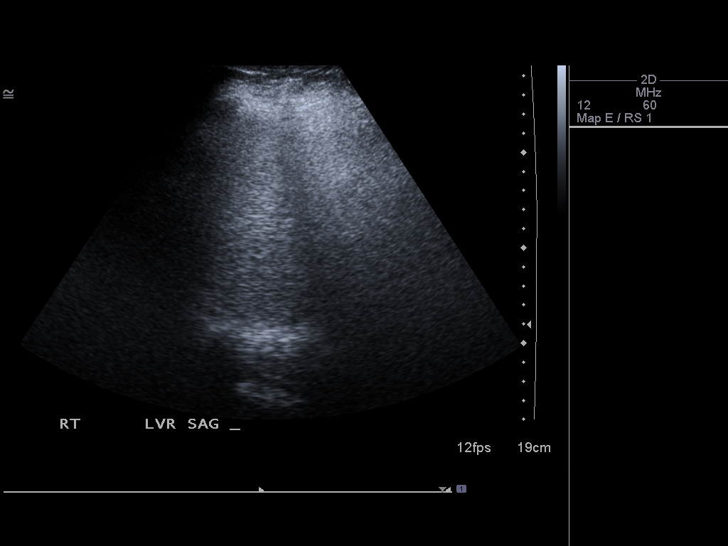
[im 24/70]
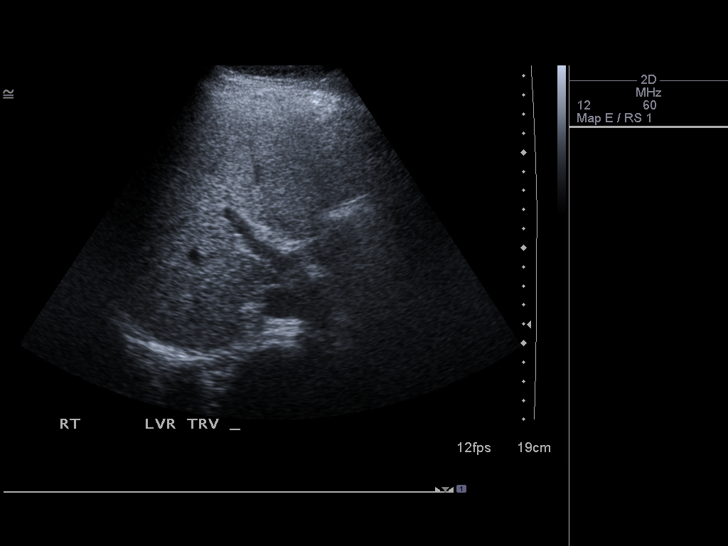
[im 29/70]
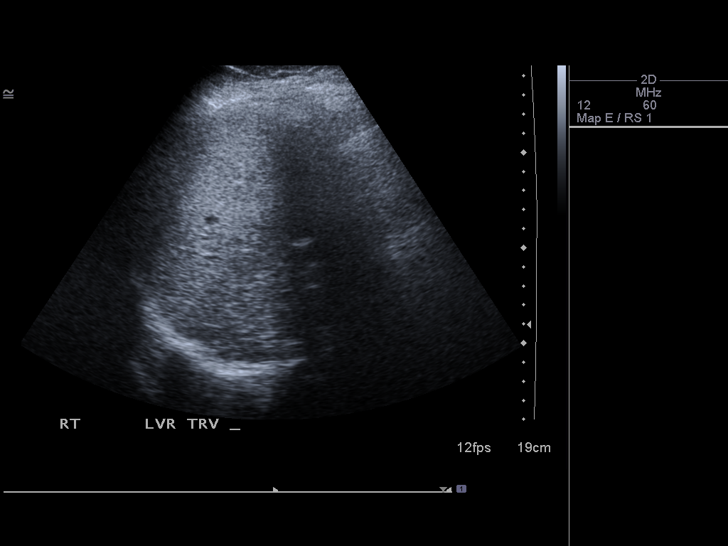
[im 35/70]
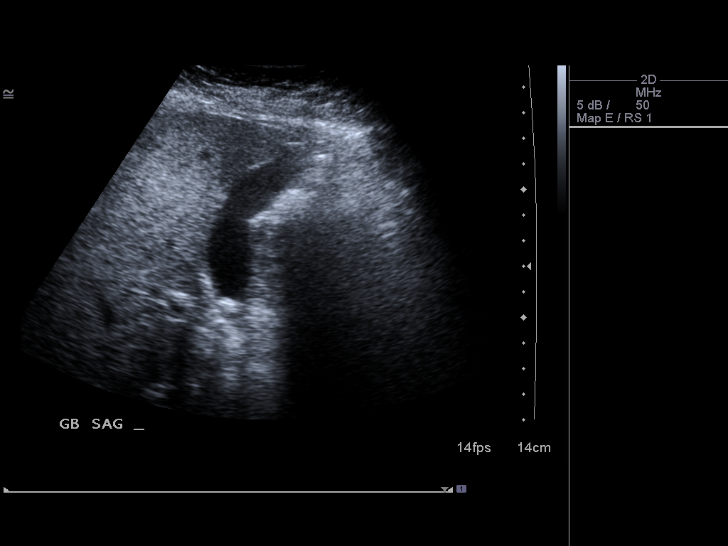
[im 41/70]
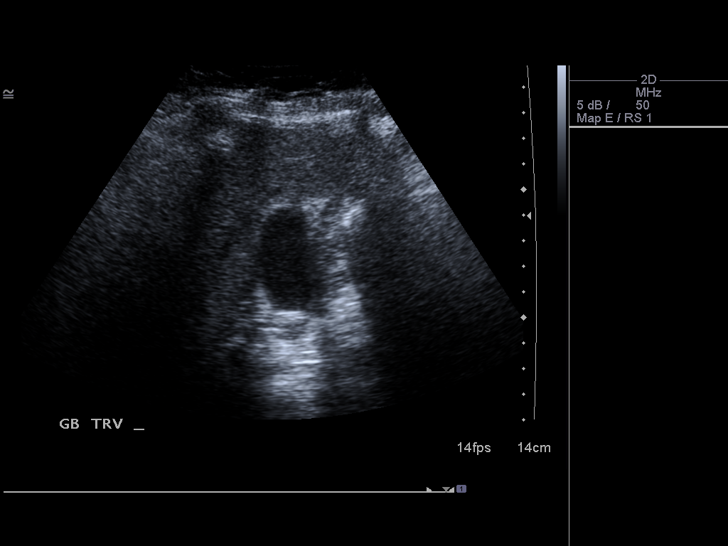
[im 47/70]
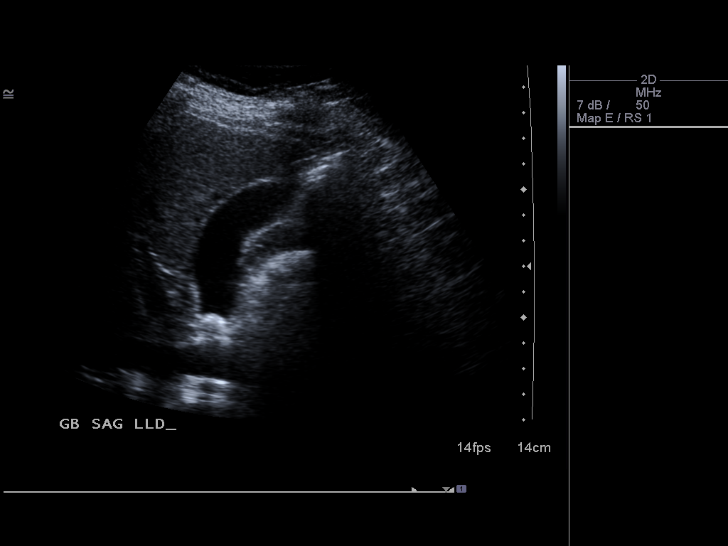
[im 52/70]
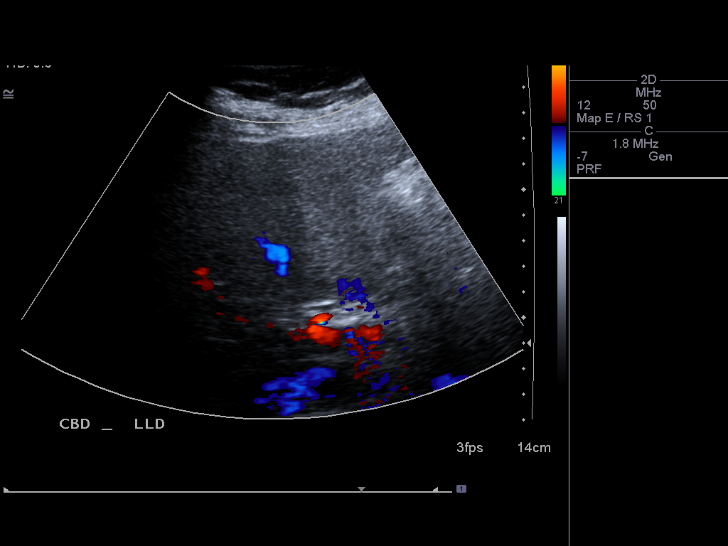
[im 58/70]
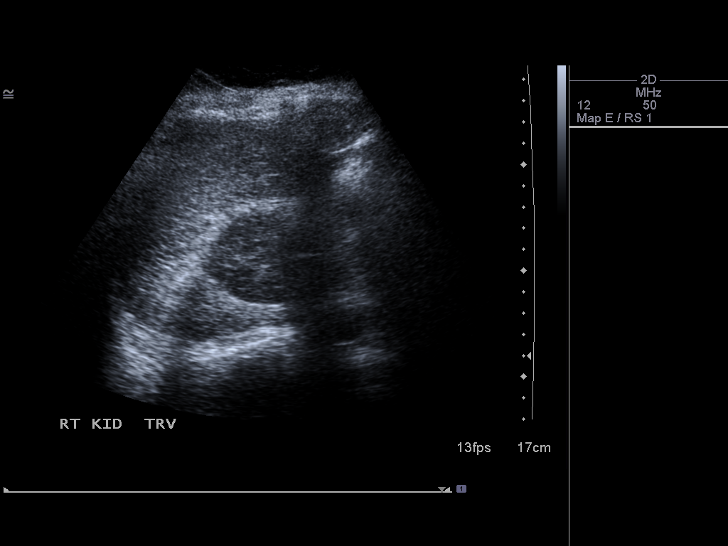
[im 64/70]
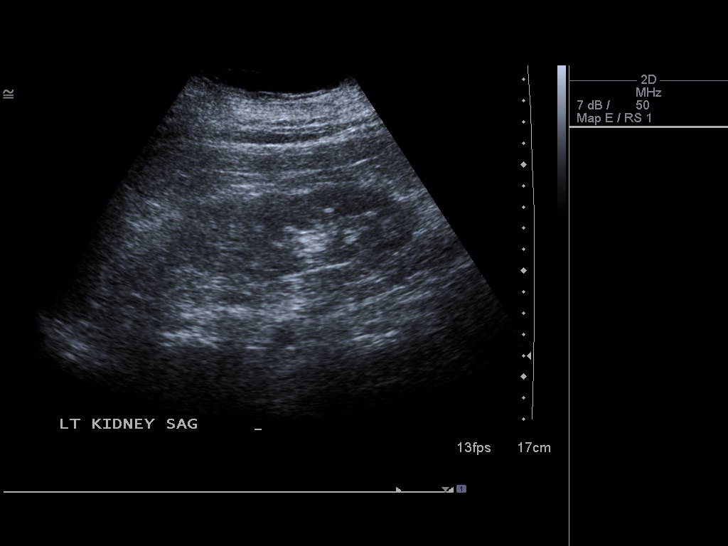
[im 70/70]
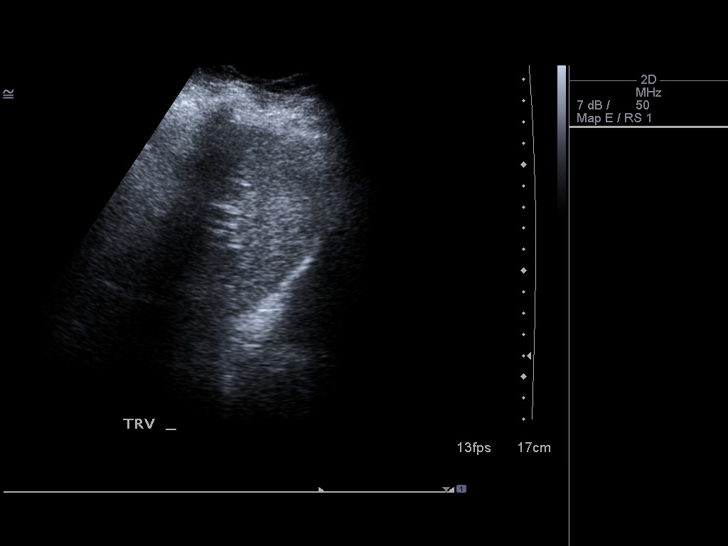

[13 of 25 positions shown; findings below may reference images not displayed]

FINDINGS: Gallbladder:

Stable cholelithiasis with roughly 9 mm calculus identified in the
neck of the gallbladder. No associated gallbladder wall thickening
or sonographic Murphy sign.

Common bile duct:

Diameter: Normal caliber of 2 mm.

Liver:

The liver demonstrates coarse echotexture and increased
echogenicity, likely reflecting diffuse steatosis. Echogenicity of
the liver appears more prominent and more heterogeneous than by
prior ultrasound. This likely represents progression of steatosis.
No overt cirrhotic contour abnormalities or focal lesions are
identified. There is no evidence of intrahepatic biliary ductal
dilatation. The portal vein is open.

IVC:

No abnormality visualized.

Pancreas:

Visualized portion unremarkable.

Spleen:

Size and appearance within normal limits.

Right Kidney:

Length: 12.4 cm. Echogenicity within normal limits. No mass or
hydronephrosis visualized.

Left Kidney:

Length: 12 cm. Echogenicity within normal limits. No mass or
hydronephrosis visualized.

Abdominal aorta:

No aneurysm visualized.

Other findings:

None.
IMPRESSION: 1. Stable sonographic appearance of cholelithiasis without evidence
by ultrasound of cholecystitis or biliary obstruction.
2. Progressive increase an hepatic echogenicity and parenchymal
heterogeneity by ultrasound likely reflects worsening
steatosis/parenchymal disease. No overt cirrhotic changes are
identified.

## 2015-02-19 ENCOUNTER — Ambulatory Visit: Payer: Medicare Other | Admitting: Family Medicine

## 2015-02-19 DIAGNOSIS — G4733 Obstructive sleep apnea (adult) (pediatric): Secondary | ICD-10-CM | POA: Diagnosis not present

## 2015-02-19 DIAGNOSIS — Z955 Presence of coronary angioplasty implant and graft: Secondary | ICD-10-CM | POA: Diagnosis not present

## 2015-02-19 DIAGNOSIS — Z9049 Acquired absence of other specified parts of digestive tract: Secondary | ICD-10-CM | POA: Diagnosis not present

## 2015-02-19 DIAGNOSIS — R591 Generalized enlarged lymph nodes: Secondary | ICD-10-CM | POA: Diagnosis not present

## 2015-02-19 DIAGNOSIS — R599 Enlarged lymph nodes, unspecified: Secondary | ICD-10-CM | POA: Diagnosis not present

## 2015-02-19 DIAGNOSIS — R590 Localized enlarged lymph nodes: Secondary | ICD-10-CM | POA: Diagnosis not present

## 2015-02-19 DIAGNOSIS — J029 Acute pharyngitis, unspecified: Secondary | ICD-10-CM | POA: Diagnosis not present

## 2015-02-19 DIAGNOSIS — Z90710 Acquired absence of both cervix and uterus: Secondary | ICD-10-CM | POA: Diagnosis not present

## 2015-02-19 DIAGNOSIS — R220 Localized swelling, mass and lump, head: Secondary | ICD-10-CM | POA: Diagnosis not present

## 2015-02-19 DIAGNOSIS — F329 Major depressive disorder, single episode, unspecified: Secondary | ICD-10-CM | POA: Diagnosis not present

## 2015-02-19 DIAGNOSIS — Z79899 Other long term (current) drug therapy: Secondary | ICD-10-CM | POA: Diagnosis not present

## 2015-02-19 DIAGNOSIS — G35 Multiple sclerosis: Secondary | ICD-10-CM | POA: Diagnosis not present

## 2015-02-19 DIAGNOSIS — R221 Localized swelling, mass and lump, neck: Secondary | ICD-10-CM | POA: Diagnosis not present

## 2015-02-19 DIAGNOSIS — Z87891 Personal history of nicotine dependence: Secondary | ICD-10-CM | POA: Diagnosis not present

## 2015-02-19 DIAGNOSIS — R9431 Abnormal electrocardiogram [ECG] [EKG]: Secondary | ICD-10-CM | POA: Diagnosis not present

## 2015-02-19 DIAGNOSIS — G894 Chronic pain syndrome: Secondary | ICD-10-CM | POA: Diagnosis not present

## 2015-02-19 DIAGNOSIS — R072 Precordial pain: Secondary | ICD-10-CM | POA: Diagnosis not present

## 2015-02-19 DIAGNOSIS — R499 Unspecified voice and resonance disorder: Secondary | ICD-10-CM | POA: Diagnosis not present

## 2015-02-19 DIAGNOSIS — J45909 Unspecified asthma, uncomplicated: Secondary | ICD-10-CM | POA: Diagnosis not present

## 2015-02-19 DIAGNOSIS — R1310 Dysphagia, unspecified: Secondary | ICD-10-CM | POA: Diagnosis not present

## 2015-02-19 DIAGNOSIS — R079 Chest pain, unspecified: Secondary | ICD-10-CM | POA: Diagnosis not present

## 2015-02-19 DIAGNOSIS — I10 Essential (primary) hypertension: Secondary | ICD-10-CM | POA: Diagnosis not present

## 2015-02-20 DIAGNOSIS — R079 Chest pain, unspecified: Secondary | ICD-10-CM | POA: Diagnosis not present

## 2015-02-20 DIAGNOSIS — R9431 Abnormal electrocardiogram [ECG] [EKG]: Secondary | ICD-10-CM | POA: Diagnosis not present

## 2015-02-22 ENCOUNTER — Telehealth: Payer: Self-pay | Admitting: Family Medicine

## 2015-02-22 NOTE — Telephone Encounter (Signed)
Pt called and wants an appt with Dr. Madilyn Fireman but she doesn't have any opening and pt does not want to see another provider. She said she has been very sick for two weeks and would like for you to call her. She doesn't want to speak to any other nurse.

## 2015-02-23 ENCOUNTER — Encounter (HOSPITAL_BASED_OUTPATIENT_CLINIC_OR_DEPARTMENT_OTHER): Payer: Self-pay | Admitting: *Deleted

## 2015-02-23 ENCOUNTER — Emergency Department (HOSPITAL_BASED_OUTPATIENT_CLINIC_OR_DEPARTMENT_OTHER)
Admission: EM | Admit: 2015-02-23 | Discharge: 2015-02-23 | Disposition: A | Discharge status: Home / Self Care | Payer: Medicare Other | Attending: Emergency Medicine | Admitting: Emergency Medicine

## 2015-02-23 DIAGNOSIS — Z79899 Other long term (current) drug therapy: Secondary | ICD-10-CM | POA: Diagnosis not present

## 2015-02-23 DIAGNOSIS — M7981 Nontraumatic hematoma of soft tissue: Secondary | ICD-10-CM | POA: Insufficient documentation

## 2015-02-23 DIAGNOSIS — G4730 Sleep apnea, unspecified: Secondary | ICD-10-CM | POA: Insufficient documentation

## 2015-02-23 DIAGNOSIS — I10 Essential (primary) hypertension: Secondary | ICD-10-CM | POA: Diagnosis not present

## 2015-02-23 DIAGNOSIS — F419 Anxiety disorder, unspecified: Secondary | ICD-10-CM | POA: Diagnosis not present

## 2015-02-23 DIAGNOSIS — M79601 Pain in right arm: Secondary | ICD-10-CM | POA: Diagnosis not present

## 2015-02-23 DIAGNOSIS — R220 Localized swelling, mass and lump, head: Secondary | ICD-10-CM | POA: Diagnosis not present

## 2015-02-23 DIAGNOSIS — Z8542 Personal history of malignant neoplasm of other parts of uterus: Secondary | ICD-10-CM | POA: Insufficient documentation

## 2015-02-23 DIAGNOSIS — M542 Cervicalgia: Secondary | ICD-10-CM | POA: Diagnosis not present

## 2015-02-23 DIAGNOSIS — Z9981 Dependence on supplemental oxygen: Secondary | ICD-10-CM | POA: Diagnosis not present

## 2015-02-23 DIAGNOSIS — Z8639 Personal history of other endocrine, nutritional and metabolic disease: Secondary | ICD-10-CM | POA: Diagnosis not present

## 2015-02-23 DIAGNOSIS — K219 Gastro-esophageal reflux disease without esophagitis: Secondary | ICD-10-CM | POA: Insufficient documentation

## 2015-02-23 DIAGNOSIS — Z8541 Personal history of malignant neoplasm of cervix uteri: Secondary | ICD-10-CM | POA: Insufficient documentation

## 2015-02-23 DIAGNOSIS — Z880 Allergy status to penicillin: Secondary | ICD-10-CM | POA: Diagnosis not present

## 2015-02-23 DIAGNOSIS — Z7951 Long term (current) use of inhaled steroids: Secondary | ICD-10-CM | POA: Insufficient documentation

## 2015-02-23 DIAGNOSIS — M79602 Pain in left arm: Secondary | ICD-10-CM | POA: Diagnosis not present

## 2015-02-23 DIAGNOSIS — Z8614 Personal history of Methicillin resistant Staphylococcus aureus infection: Secondary | ICD-10-CM | POA: Insufficient documentation

## 2015-02-23 DIAGNOSIS — J45909 Unspecified asthma, uncomplicated: Secondary | ICD-10-CM | POA: Insufficient documentation

## 2015-02-23 DIAGNOSIS — Z862 Personal history of diseases of the blood and blood-forming organs and certain disorders involving the immune mechanism: Secondary | ICD-10-CM | POA: Insufficient documentation

## 2015-02-23 DIAGNOSIS — Z87891 Personal history of nicotine dependence: Secondary | ICD-10-CM | POA: Diagnosis not present

## 2015-02-23 DIAGNOSIS — G35 Multiple sclerosis: Secondary | ICD-10-CM | POA: Diagnosis not present

## 2015-02-23 DIAGNOSIS — Z9889 Other specified postprocedural states: Secondary | ICD-10-CM | POA: Diagnosis not present

## 2015-02-23 DIAGNOSIS — G8929 Other chronic pain: Secondary | ICD-10-CM | POA: Diagnosis not present

## 2015-02-23 DIAGNOSIS — T783XXA Angioneurotic edema, initial encounter: Secondary | ICD-10-CM | POA: Diagnosis not present

## 2015-02-23 DIAGNOSIS — E876 Hypokalemia: Secondary | ICD-10-CM | POA: Diagnosis not present

## 2015-02-23 MED ORDER — DEXAMETHASONE 4 MG PO TABS
ORAL_TABLET | ORAL | Status: AC
  Filled 2015-02-23: qty 3

## 2015-02-23 MED ORDER — FAMOTIDINE 20 MG PO TABS
20.0000 mg | ORAL_TABLET | Freq: Once | ORAL | Status: AC
  Administered 2015-02-23: 20 mg via ORAL
  Filled 2015-02-23: qty 1

## 2015-02-23 MED ORDER — DEXAMETHASONE 4 MG PO TABS
10.0000 mg | ORAL_TABLET | Freq: Once | ORAL | Status: AC
  Administered 2015-02-23: 10 mg via ORAL

## 2015-02-23 MED ORDER — DEXAMETHASONE SODIUM PHOSPHATE 10 MG/ML IJ SOLN
10.0000 mg | Freq: Once | INTRAMUSCULAR | Status: DC
  Filled 2015-02-23: qty 1

## 2015-02-23 MED ORDER — DIPHENHYDRAMINE HCL 25 MG PO CAPS
50.0000 mg | ORAL_CAPSULE | Freq: Once | ORAL | Status: DC
  Filled 2015-02-23: qty 2

## 2015-02-23 NOTE — Discharge Instructions (Signed)
It is important for you to follow-up with your PCP and allergist for further evaluation and management of your symptoms. There does not appear to be an emergent cause for your symptoms today. Return to ED for worsening symptoms. Usual EpiPen as prescribed.

## 2015-02-23 NOTE — ED Notes (Signed)
C/o bruising to bil under arms and has had u/s for same. States her tonque has been swelling on edges for past week. Pt has had multi lab test recently for same and all results are not back yet.

## 2015-02-23 NOTE — Telephone Encounter (Signed)
lvm for rtn call.Jennifer [name] [place]
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
## 2015-02-24 ENCOUNTER — Telehealth: Payer: Self-pay | Admitting: Family Medicine

## 2015-02-24 DIAGNOSIS — T7840XA Allergy, unspecified, initial encounter: Secondary | ICD-10-CM | POA: Diagnosis not present

## 2015-02-24 DIAGNOSIS — Z886 Allergy status to analgesic agent status: Secondary | ICD-10-CM | POA: Diagnosis not present

## 2015-02-24 DIAGNOSIS — G4733 Obstructive sleep apnea (adult) (pediatric): Secondary | ICD-10-CM | POA: Diagnosis not present

## 2015-02-24 DIAGNOSIS — J45909 Unspecified asthma, uncomplicated: Secondary | ICD-10-CM | POA: Diagnosis not present

## 2015-02-24 DIAGNOSIS — Z87891 Personal history of nicotine dependence: Secondary | ICD-10-CM | POA: Diagnosis not present

## 2015-02-24 DIAGNOSIS — I10 Essential (primary) hypertension: Secondary | ICD-10-CM | POA: Diagnosis not present

## 2015-02-24 DIAGNOSIS — G35 Multiple sclerosis: Secondary | ICD-10-CM | POA: Diagnosis not present

## 2015-02-24 DIAGNOSIS — Z888 Allergy status to other drugs, medicaments and biological substances status: Secondary | ICD-10-CM | POA: Diagnosis not present

## 2015-02-24 DIAGNOSIS — G894 Chronic pain syndrome: Secondary | ICD-10-CM | POA: Diagnosis not present

## 2015-02-24 DIAGNOSIS — Z885 Allergy status to narcotic agent status: Secondary | ICD-10-CM | POA: Diagnosis not present

## 2015-02-24 DIAGNOSIS — Z79899 Other long term (current) drug therapy: Secondary | ICD-10-CM | POA: Diagnosis not present

## 2015-02-24 NOTE — Telephone Encounter (Signed)
Please call patient and let her know that her potassium is dropped a little bit again. She needs to make sure that she is taking her potassium supplement

## 2015-02-25 ENCOUNTER — Encounter: Payer: Self-pay | Admitting: Family Medicine

## 2015-02-25 ENCOUNTER — Ambulatory Visit (INDEPENDENT_AMBULATORY_CARE_PROVIDER_SITE_OTHER): Payer: Medicare Other | Admitting: Family Medicine

## 2015-02-25 VITALS — BP 134/80 | HR 94 | Temp 98.1°F | Ht 62.0 in | Wt 195.0 lb

## 2015-02-25 DIAGNOSIS — R590 Localized enlarged lymph nodes: Secondary | ICD-10-CM | POA: Diagnosis not present

## 2015-02-25 DIAGNOSIS — L509 Urticaria, unspecified: Secondary | ICD-10-CM | POA: Diagnosis not present

## 2015-02-25 DIAGNOSIS — I10 Essential (primary) hypertension: Secondary | ICD-10-CM | POA: Diagnosis not present

## 2015-02-25 DIAGNOSIS — E860 Dehydration: Secondary | ICD-10-CM

## 2015-02-25 DIAGNOSIS — B370 Candidal stomatitis: Secondary | ICD-10-CM | POA: Diagnosis not present

## 2015-02-25 DIAGNOSIS — T783XXD Angioneurotic edema, subsequent encounter: Secondary | ICD-10-CM

## 2015-02-25 LAB — POCT URINALYSIS DIPSTICK
Bilirubin, UA: NEGATIVE
Blood, UA: NEGATIVE
Glucose, UA: NEGATIVE
Ketones, UA: NEGATIVE
Leukocytes, UA: NEGATIVE
Nitrite, UA: NEGATIVE
Protein, UA: 30
Spec Grav, UA: >=1.03
Urobilinogen, UA: 0.2
pH, UA: 5.5

## 2015-02-25 MED ORDER — LEVOFLOXACIN 250 MG PO TABS: 250.0000 mg | ORAL_TABLET | Freq: Every day | ORAL | Status: DC

## 2015-02-25 MED ORDER — NYSTATIN 100000 UNIT/ML MT SUSP: 5.0000 mL | Freq: Four times a day (QID) | OROMUCOSAL | Status: DC

## 2015-02-25 NOTE — Telephone Encounter (Signed)
Pt notified in office today.

## 2015-02-25 NOTE — Progress Notes (Signed)
Subjective:    Patient ID: Jennifer [name], female    DOB: 17-Jun-1966, 49 y.o.   MRN: [id number]  HPI She has been spending hours at the gym so some cardio.  She was swimmy headed.  Went to the cardiologists and they tried to start her on Micardis. She took it for 4-5 days over July 4th.  They had given her expired samples.  She had called her and we told her she could restart the amlodipine but she never started it. She started to get mouth swelling and hives. Not sure if was a reaction to the Micardis. Went to ED and they did a CT scan at [facility] and told had swollen LNs so was given an antibiotic. She stopped it because felt like she couldn't stayed hydrated enough to take it.  She feels like she is getting dehydrated.  No urinary sxs.  She feels like her tongue is thick. Her allergist wants a tryptase checked when this happened.  They put her on steroids at the ED.  She now feels like she's getting thrush in her mouth.  Review of Systems     Objective:   Physical Exam  Constitutional: She is oriented to person, place, and time. She appears well-developed and well-nourished.  HENT:  Head: Normocephalic and atraumatic.  Right Ear: External ear normal.  Left Ear: External ear normal.  Nose: Nose normal.  Mouth/Throat: Oropharynx is clear and moist.  TMs and canals are clear. Thick white mucus in the back of the throat and along the edges of the tongue. There is some indentation along the side of the tongue with imprints of her teeth.  Eyes: Conjunctivae and EOM are normal. Pupils are equal, round, and reactive to light.  Neck: Neck supple. No thyromegaly present.  Bilateral anterior cervical lymphadenopathy proximal like to have centimeters in size and tender to touch.  Cardiovascular: Normal rate, regular rhythm and normal heart sounds.   Pulmonary/Chest: Effort normal and breath sounds normal. She has no wheezes.  Lymphadenopathy:    She has cervical adenopathy.  Neurological: She is  alert and oriented to person, place, and time.  Skin: Skin is warm and dry.  Psychiatric: She has a normal mood and affect.          Assessment & Plan:  HTN -  Blood pressure actually looks okay today that stone the prehypertensive range thus she's a little mildly dehydrated based on her exam and history today. Make sure hydrating well. Encouraged her to really push fluids today. And continue to monitor blood pressures. Encouraged her to keep a log of the pressures and bring them in with her when I see her next week.  Hives/tongue -   The acuteness has seemed to resolve. She did show me a photo of her face was quite red around the mouth and the nose. That has resolved but she still has a sensation that her tongue feels a little bit thick. She does have little grooves in the side of her tongue wear her teeth and press upon it. So she could still have a little trace edema. Encouraged her just to stay hydrated and take it easy over the next few days. 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 I did go ahead and give her a lab slip to check a tryptase level next time this happens. She had a standing order from her other physician but unfortunately they would not draw  to the emergency department.    Cervical lymphadenopathy - she has tolerated Levaquin in the past.  Start 250mg  daily.  Will start nystatin swish and swallow as well.

## 2015-02-25 NOTE — Addendum Note (Signed)
Addended by: Beatris Ship L on: 02/25/2015 02:20 PM   Modules accepted: Orders

## 2015-02-26 LAB — KOH PREP

## 2015-03-01 DIAGNOSIS — E611 Iron deficiency: Secondary | ICD-10-CM | POA: Diagnosis not present

## 2015-03-01 DIAGNOSIS — Z8542 Personal history of malignant neoplasm of other parts of uterus: Secondary | ICD-10-CM | POA: Diagnosis not present

## 2015-03-01 DIAGNOSIS — R5383 Other fatigue: Secondary | ICD-10-CM | POA: Diagnosis not present

## 2015-03-01 DIAGNOSIS — Z90710 Acquired absence of both cervix and uterus: Secondary | ICD-10-CM | POA: Diagnosis not present

## 2015-03-01 DIAGNOSIS — E269 Hyperaldosteronism, unspecified: Secondary | ICD-10-CM | POA: Diagnosis not present

## 2015-03-01 DIAGNOSIS — Z885 Allergy status to narcotic agent status: Secondary | ICD-10-CM | POA: Diagnosis not present

## 2015-03-01 DIAGNOSIS — E041 Nontoxic single thyroid nodule: Secondary | ICD-10-CM | POA: Diagnosis not present

## 2015-03-01 DIAGNOSIS — G35 Multiple sclerosis: Secondary | ICD-10-CM | POA: Diagnosis not present

## 2015-03-01 DIAGNOSIS — R590 Localized enlarged lymph nodes: Secondary | ICD-10-CM | POA: Diagnosis not present

## 2015-03-01 DIAGNOSIS — C549 Malignant neoplasm of corpus uteri, unspecified: Secondary | ICD-10-CM | POA: Diagnosis not present

## 2015-03-02 ENCOUNTER — Telehealth: Payer: Self-pay | Admitting: *Deleted

## 2015-03-02 ENCOUNTER — Ambulatory Visit: Payer: Medicare Other | Admitting: Sports Medicine

## 2015-03-02 NOTE — Telephone Encounter (Signed)
Pt called today stating that he throat is now hurting and she's almost done with the nystatin mouthwash.  She stated that it's almost like the thrush has traveled down to her throat.  She wants to know if something can be sent in today for her, she has an appt with you tomorrow.  Please advise.

## 2015-03-02 NOTE — Telephone Encounter (Signed)
Okay to refill but she needs to be swallowing every time to completely treat the infection.

## 2015-03-02 NOTE — Telephone Encounter (Signed)
Pt states sometimes she is swishing and sometimes she swallows the mouthwash. Her stomach has been bothering her and wonders if it is due to the times she has swallowed the mouthwash. Pt is completely out of the mouthwash at this time.   Pt also states her throat is still very swollen and wonders if she should be referred to an ENT. Will route to PCP for review.

## 2015-03-02 NOTE — Telephone Encounter (Signed)
Has she been swallowing the nystatin. If so then it is not down her throat. If she has only been switching and gargling then she could have thrush down her throat.

## 2015-03-03 ENCOUNTER — Ambulatory Visit (INDEPENDENT_AMBULATORY_CARE_PROVIDER_SITE_OTHER): Payer: Medicare Other | Admitting: Family Medicine

## 2015-03-03 ENCOUNTER — Encounter: Payer: Self-pay | Admitting: Family Medicine

## 2015-03-03 VITALS — BP 134/77 | HR 90 | Ht 62.0 in | Wt 192.0 lb

## 2015-03-03 DIAGNOSIS — B370 Candidal stomatitis: Secondary | ICD-10-CM

## 2015-03-03 DIAGNOSIS — E041 Nontoxic single thyroid nodule: Secondary | ICD-10-CM | POA: Diagnosis not present

## 2015-03-03 DIAGNOSIS — F458 Other somatoform disorders: Secondary | ICD-10-CM

## 2015-03-03 DIAGNOSIS — K118 Other diseases of salivary glands: Secondary | ICD-10-CM | POA: Diagnosis not present

## 2015-03-03 DIAGNOSIS — R198 Other specified symptoms and signs involving the digestive system and abdomen: Secondary | ICD-10-CM

## 2015-03-03 DIAGNOSIS — R609 Edema, unspecified: Secondary | ICD-10-CM

## 2015-03-03 DIAGNOSIS — I10 Essential (primary) hypertension: Secondary | ICD-10-CM

## 2015-03-03 DIAGNOSIS — R0989 Other specified symptoms and signs involving the circulatory and respiratory systems: Secondary | ICD-10-CM

## 2015-03-03 MED ORDER — NYSTATIN 100000 UNIT/ML MT SUSP: 5.0000 mL | Freq: Four times a day (QID) | OROMUCOSAL | Status: DC

## 2015-03-03 NOTE — Telephone Encounter (Signed)
Rx sent in to pharmacy, Pt coming in today for appt.

## 2015-03-03 NOTE — Addendum Note (Signed)
Addended by: Huel Cote on: 03/03/2015 09:59 AM   Modules accepted: Orders

## 2015-03-03 NOTE — Progress Notes (Signed)
   Subjective:    Patient ID: Jennifer [name], female    DOB: 11-09-1965, 49 y.o.   MRN: [id number]  HPI Hypertension- Pt denies chest pain, SOB, dizziness, or heart palpitations.  Taking meds as directed w/o problems.  Denies medication side effects.    Patient comes in today still complaining of some difficulty swallowing and feeling some thickness in her tongue and back of her throat. After I last saw her about a week ago she did go back to the emergency department and did have a CT of the neck soft tissue with contrast performed on July 8 for thousand and 16. It did show prominent submandibular glands bilaterally. As well as a dense structure in the right thyroid lobe. Unable to tell if it was more of a cyst or nodule. There was minimal change of the submandicular glands compared to previous study. And there was no lymphadenopathy. I actually put her on a low dose of Levaquin when I last saw her she was not able to complete the about it that she was given in the emergency department. She still has a couple more days left on map.  I also started her on treatment for thrush a week ago. She admits that she was not swallowing the medication some of the time. Sometimes she wishes swishing and spitting. But she feels like she still has a little bit of the thrush left in her mouth. She caught a couple days ago wondering if it can actually move into her throat and esophagus. We just sent a new full prescription over to the pharmacy yesterday for her to start and make sure that she swallows with each use.   Review of Systems     Objective:   Physical Exam  Constitutional: She is oriented to person, place, and time. She appears well-developed and well-nourished.  HENT:  Head: Normocephalic and atraumatic.  Nose: Nose normal.  Mouth/Throat: Oropharynx is clear and moist.  Neck: Neck supple. No thyromegaly present.  The submandicular glands are still swollen and mildly tender.    Cardiovascular:  Normal rate, regular rhythm and normal heart sounds.   Pulmonary/Chest: Effort normal and breath sounds normal.  Lymphadenopathy:    She has no cervical adenopathy.  Neurological: She is alert and oriented to person, place, and time.  Skin: Skin is warm and dry.  Psychiatric: She has a normal mood and affect. Her behavior is normal.          Assessment & Plan:  HTN - blood pressure actually looks really well controlled today. Hold off on the amlodipine.    Neck swelling with trouble swallowing- likely combination of swollen submandibular glands and thrush.    [name] - make sure to pick up new rx adn make sure swallowing med for each dose. Will recheck her throat in one week.   Swollen bilateral submandibular glands x 1 month. Can try sialigogue.  Dr. Mylo Red at [facility] at [place]. Has appt on Friday.    Thyroid nodule-based on size do not recommend needle biopsy. But we can schedule her for a thyroid ultrasound in 6-12 months. Looked back at [place] in 03/25/15 ans was noted to have 0.5 cm nodule in the right gland as well.

## 2015-03-04 ENCOUNTER — Ambulatory Visit: Payer: Medicare Other | Admitting: Family Medicine

## 2015-03-04 DIAGNOSIS — J029 Acute pharyngitis, unspecified: Secondary | ICD-10-CM | POA: Diagnosis not present

## 2015-03-04 DIAGNOSIS — K219 Gastro-esophageal reflux disease without esophagitis: Secondary | ICD-10-CM | POA: Diagnosis not present

## 2015-03-04 DIAGNOSIS — Z87891 Personal history of nicotine dependence: Secondary | ICD-10-CM | POA: Diagnosis not present

## 2015-03-04 DIAGNOSIS — Z9049 Acquired absence of other specified parts of digestive tract: Secondary | ICD-10-CM | POA: Diagnosis not present

## 2015-03-04 DIAGNOSIS — R499 Unspecified voice and resonance disorder: Secondary | ICD-10-CM | POA: Diagnosis not present

## 2015-03-04 DIAGNOSIS — R682 Dry mouth, unspecified: Secondary | ICD-10-CM | POA: Diagnosis not present

## 2015-03-04 DIAGNOSIS — Z90710 Acquired absence of both cervix and uterus: Secondary | ICD-10-CM | POA: Diagnosis not present

## 2015-03-04 DIAGNOSIS — Z79899 Other long term (current) drug therapy: Secondary | ICD-10-CM | POA: Diagnosis not present

## 2015-03-04 DIAGNOSIS — J45909 Unspecified asthma, uncomplicated: Secondary | ICD-10-CM | POA: Diagnosis not present

## 2015-03-04 DIAGNOSIS — F329 Major depressive disorder, single episode, unspecified: Secondary | ICD-10-CM | POA: Diagnosis not present

## 2015-03-04 DIAGNOSIS — F458 Other somatoform disorders: Secondary | ICD-10-CM | POA: Diagnosis not present

## 2015-03-04 DIAGNOSIS — R070 Pain in throat: Secondary | ICD-10-CM | POA: Diagnosis not present

## 2015-03-04 DIAGNOSIS — G4733 Obstructive sleep apnea (adult) (pediatric): Secondary | ICD-10-CM | POA: Diagnosis not present

## 2015-03-04 DIAGNOSIS — I10 Essential (primary) hypertension: Secondary | ICD-10-CM | POA: Diagnosis not present

## 2015-03-04 DIAGNOSIS — G894 Chronic pain syndrome: Secondary | ICD-10-CM | POA: Diagnosis not present

## 2015-03-04 DIAGNOSIS — G35 Multiple sclerosis: Secondary | ICD-10-CM | POA: Diagnosis not present

## 2015-03-05 DIAGNOSIS — Z882 Allergy status to sulfonamides status: Secondary | ICD-10-CM | POA: Diagnosis not present

## 2015-03-05 DIAGNOSIS — K219 Gastro-esophageal reflux disease without esophagitis: Secondary | ICD-10-CM | POA: Diagnosis not present

## 2015-03-05 DIAGNOSIS — Z888 Allergy status to other drugs, medicaments and biological substances status: Secondary | ICD-10-CM | POA: Diagnosis not present

## 2015-03-05 DIAGNOSIS — Z881 Allergy status to other antibiotic agents status: Secondary | ICD-10-CM | POA: Diagnosis not present

## 2015-03-05 DIAGNOSIS — F458 Other somatoform disorders: Secondary | ICD-10-CM | POA: Diagnosis not present

## 2015-03-05 DIAGNOSIS — J387 Other diseases of larynx: Secondary | ICD-10-CM | POA: Diagnosis not present

## 2015-03-05 DIAGNOSIS — Z885 Allergy status to narcotic agent status: Secondary | ICD-10-CM | POA: Diagnosis not present

## 2015-03-05 DIAGNOSIS — Z880 Allergy status to penicillin: Secondary | ICD-10-CM | POA: Diagnosis not present

## 2015-03-05 DIAGNOSIS — Z87891 Personal history of nicotine dependence: Secondary | ICD-10-CM | POA: Diagnosis not present

## 2015-03-05 DIAGNOSIS — K118 Other diseases of salivary glands: Secondary | ICD-10-CM | POA: Diagnosis not present

## 2015-03-07 IMAGING — CR DG CHEST 2V
2 series · 2 of 2 positions shown · non-contrast
Comparison: 11/20/2013

CLINICAL DATA: Cough for several days, past history uterine cancer,
asthma, hypertension

EXAM:
CHEST  2 VIEW

[w chest pa]
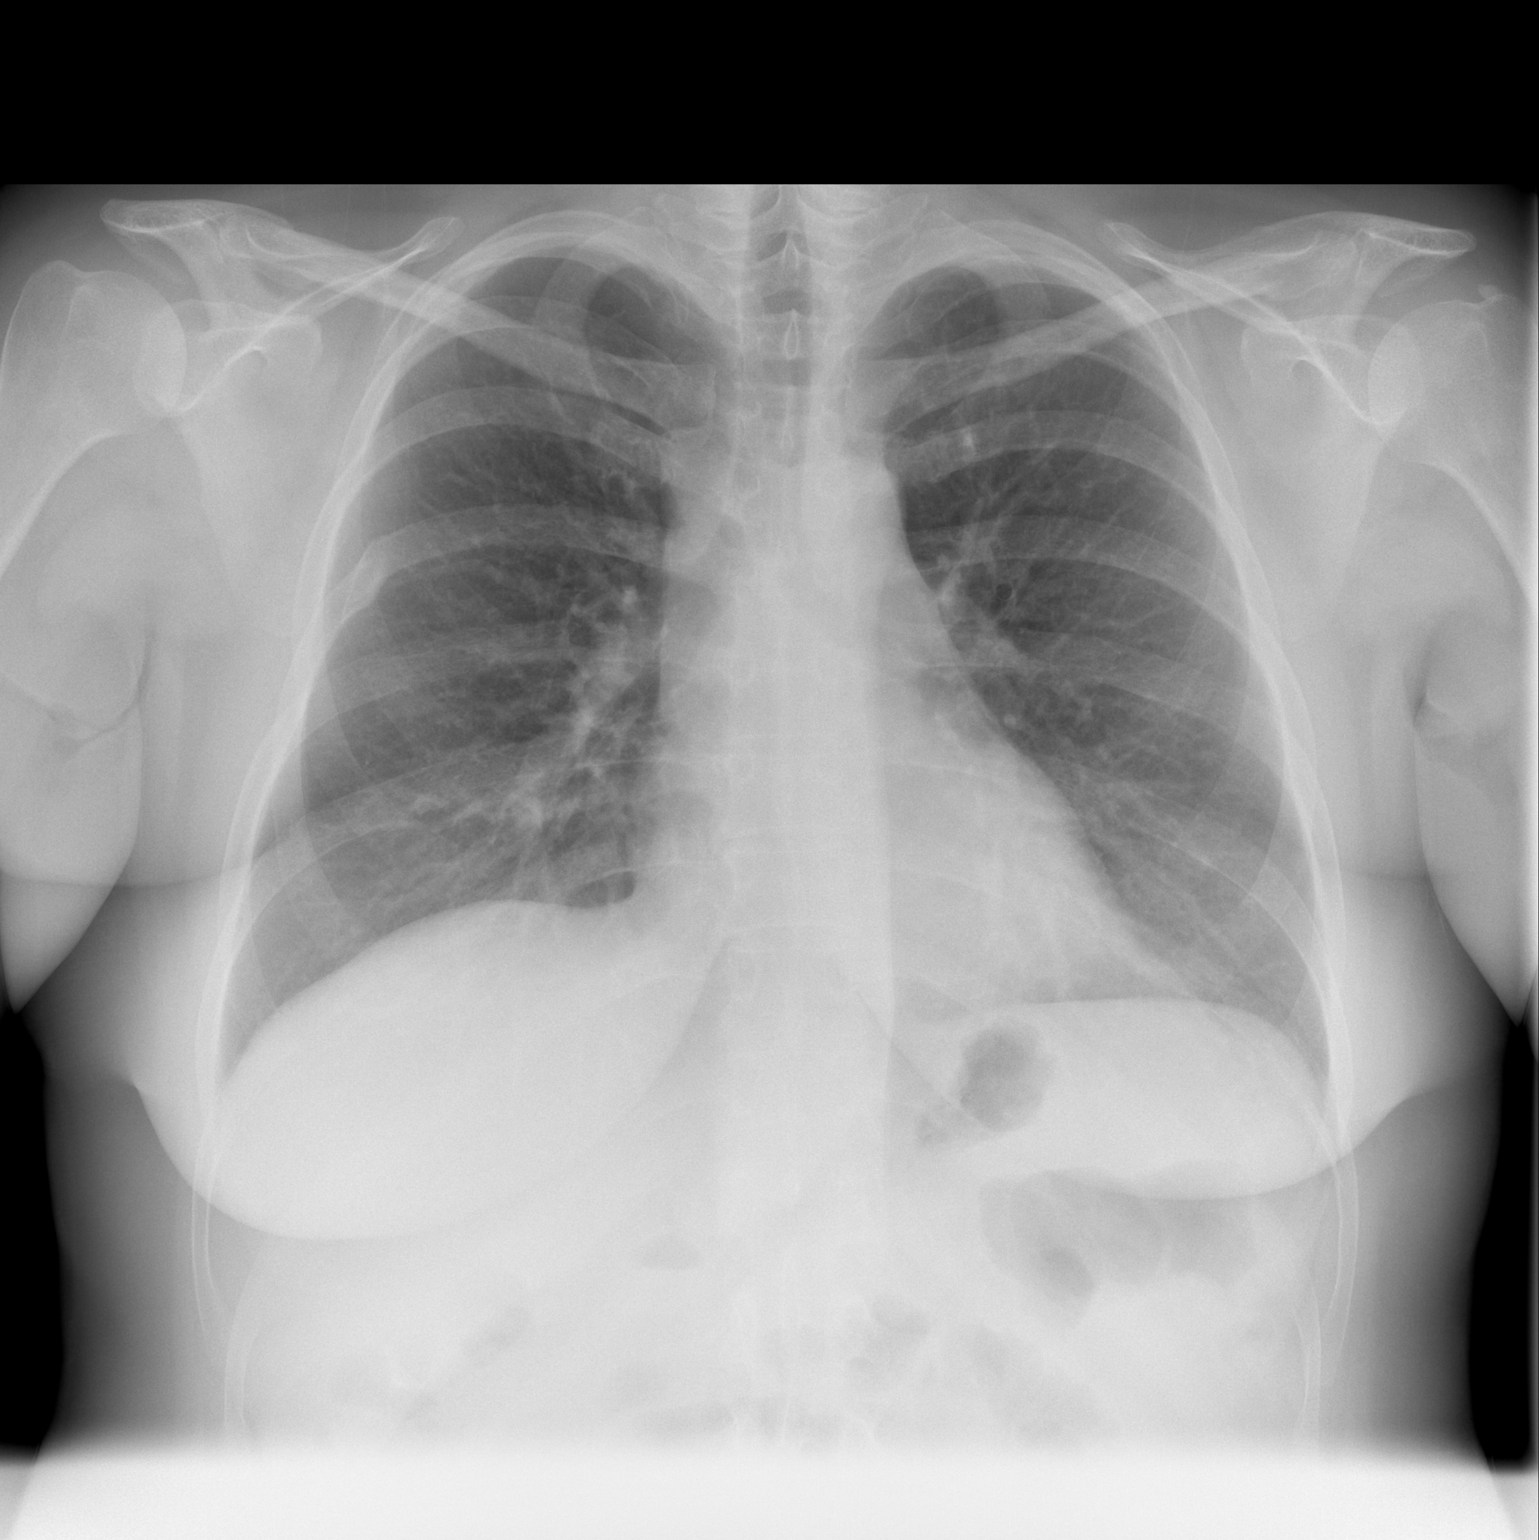

[w chest lat]
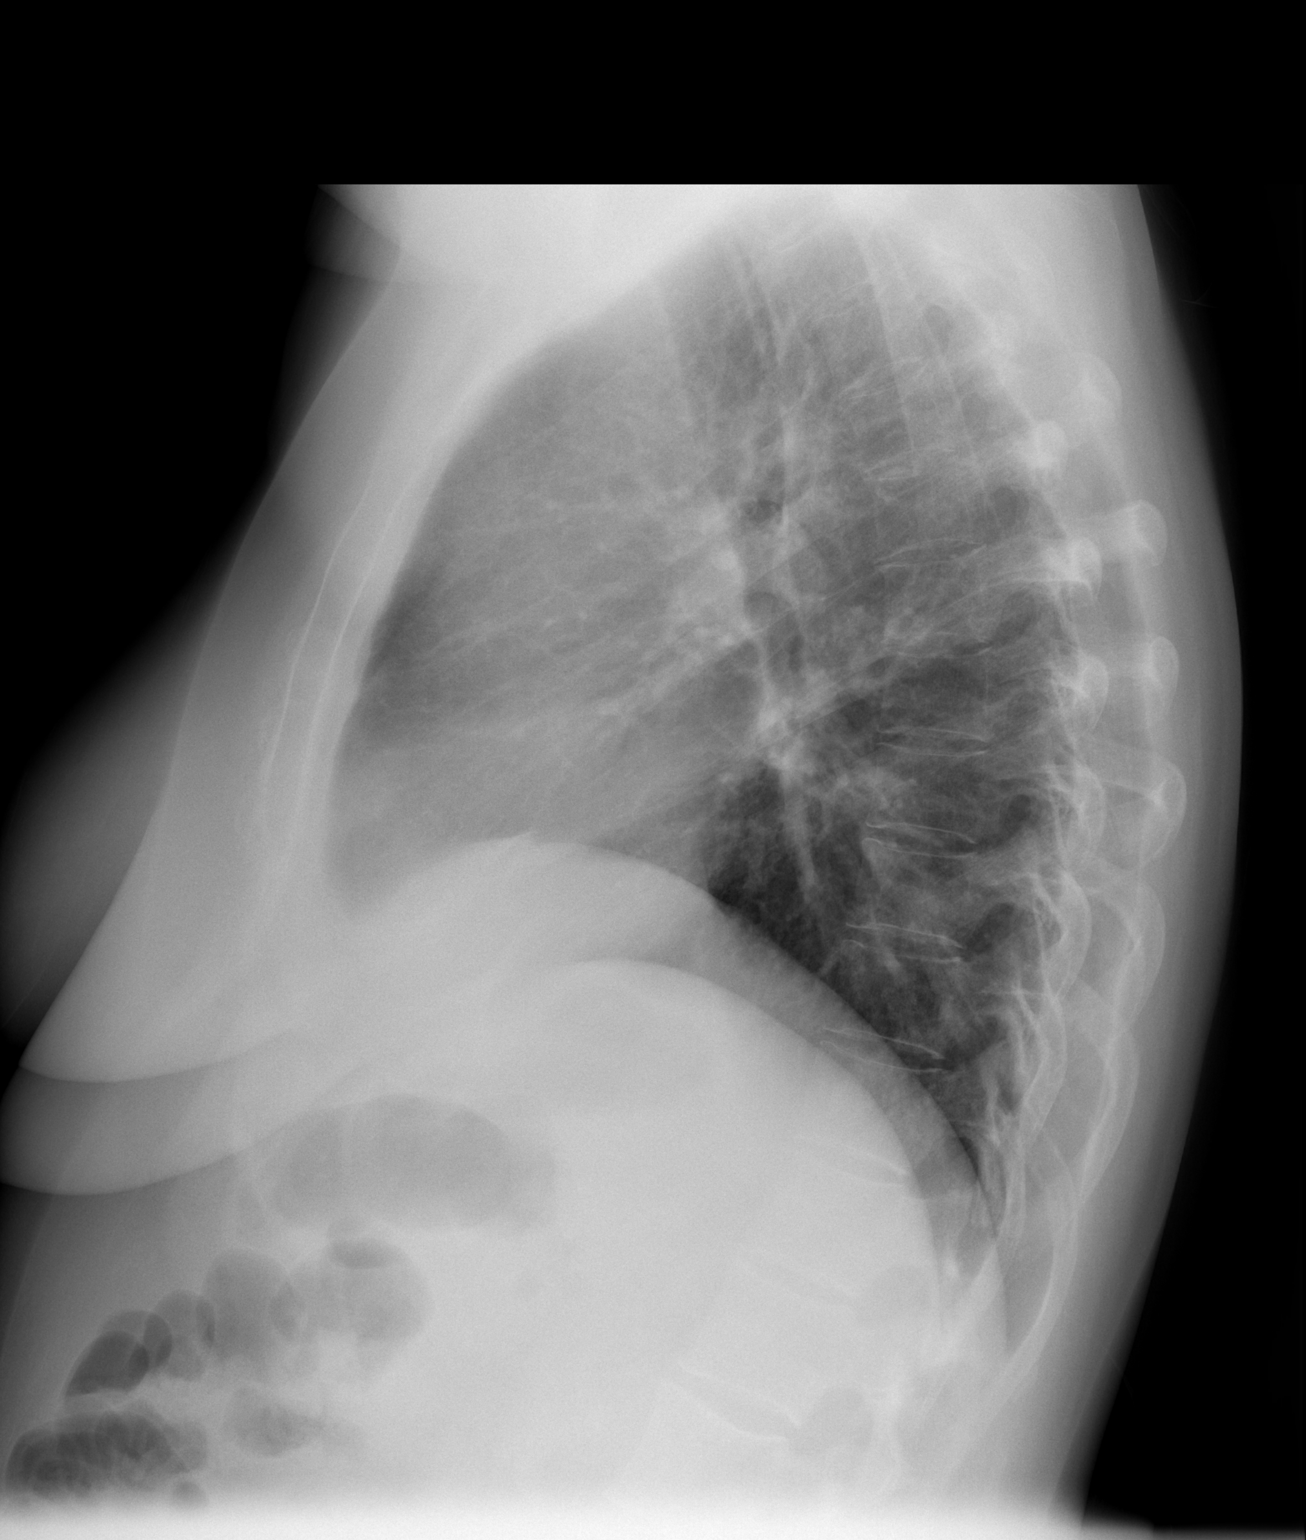

[2 of 2 positions shown; findings below may reference images not displayed]

FINDINGS: Normal heart size, mediastinal contours, and pulmonary vascularity.

Lungs clear.

No pleural effusion or pneumothorax.

Old fracture posterior lateral RIGHT sixth rib.
IMPRESSION: No acute abnormalities.

## 2015-03-10 ENCOUNTER — Encounter: Payer: Self-pay | Admitting: Family Medicine

## 2015-03-10 ENCOUNTER — Ambulatory Visit (INDEPENDENT_AMBULATORY_CARE_PROVIDER_SITE_OTHER): Payer: Medicare Other | Admitting: Family Medicine

## 2015-03-10 VITALS — BP 138/87 | HR 96 | Ht 62.0 in | Wt 193.0 lb

## 2015-03-10 DIAGNOSIS — R110 Nausea: Secondary | ICD-10-CM | POA: Diagnosis not present

## 2015-03-10 DIAGNOSIS — K2100 Gastro-esophageal reflux disease with esophagitis, without bleeding: Secondary | ICD-10-CM

## 2015-03-10 DIAGNOSIS — F329 Major depressive disorder, single episode, unspecified: Secondary | ICD-10-CM

## 2015-03-10 DIAGNOSIS — Z87898 Personal history of other specified conditions: Secondary | ICD-10-CM | POA: Diagnosis not present

## 2015-03-10 DIAGNOSIS — E041 Nontoxic single thyroid nodule: Secondary | ICD-10-CM | POA: Diagnosis not present

## 2015-03-10 DIAGNOSIS — R1012 Left upper quadrant pain: Secondary | ICD-10-CM | POA: Diagnosis not present

## 2015-03-10 DIAGNOSIS — B370 Candidal stomatitis: Secondary | ICD-10-CM | POA: Diagnosis not present

## 2015-03-10 DIAGNOSIS — E876 Hypokalemia: Secondary | ICD-10-CM | POA: Diagnosis not present

## 2015-03-10 DIAGNOSIS — R1011 Right upper quadrant pain: Secondary | ICD-10-CM | POA: Diagnosis not present

## 2015-03-10 DIAGNOSIS — R1013 Epigastric pain: Secondary | ICD-10-CM | POA: Diagnosis not present

## 2015-03-10 DIAGNOSIS — F32A Depression, unspecified: Secondary | ICD-10-CM

## 2015-03-10 LAB — BASIC METABOLIC PANEL
BUN: 16 mg/dL (ref 4–21)
Creatinine: 0.5 mg/dL (ref 0.5–1.1)
Glucose: 132 mg/dL
Potassium: 3.8 mmol/L (ref 3.4–5.3)
Sodium: 141 mmol/L (ref 137–147)

## 2015-03-10 LAB — COMPREHENSIVE METABOLIC PANEL: Calcium, Ser: 8.9

## 2015-03-10 MED ORDER — OMEPRAZOLE 40 MG PO CPDR: 40.0000 mg | DELAYED_RELEASE_CAPSULE | Freq: Every day | ORAL | Status: DC

## 2015-03-10 NOTE — Progress Notes (Signed)
   Subjective:    Patient ID: Jennifer [name], female    DOB: 1965/10/14, 49 y.o.   MRN: [id number]  HPI Today patient is here to follow-up for his thrush and some mild submandibular gland swelling. She did see ENT last Friday.  At originally treated her thrush with nystatin swish and swallow but she was not actually swallowing the medication. So I gave her second round of treatment last week.  Had endoscopy and was told had signs of reflux again.  Was not given any Rx but told to get prilosec OTC.   Review of Systems     Objective:   Physical Exam  Constitutional: She is oriented to person, place, and time. She appears well-developed and well-nourished.  HENT:  Head: Normocephalic and atraumatic.  Right Ear: External ear normal.  Left Ear: External ear normal.  Mouth/Throat: Oropharynx is clear and moist. No oropharyngeal exudate.  Cardiovascular: Normal rate, regular rhythm and normal heart sounds.   Pulmonary/Chest: Effort normal and breath sounds normal.  Neurological: She is alert and oriented to person, place, and time.  Skin: Skin is warm and dry.  Psychiatric: She has a normal mood and affect. Her behavior is normal.          Assessment & Plan:  Thrush-resolved. Can keep the rest of the medication in case needs to use it again.    Depression - she is really starting to feel down again and not wanting to get up and do things during the day. She says she can feel it coming on. For a while she was doing great and going to the gym and started getting some chest pain while going to the gym so has quit. Encouraged her to ease herself back into her exercise routine. Again encouraged her to get back in with a therapist or counselor. She had worked with someone in [place] area for almost 10 years.  GERD- will start PPI. Sent to pharmacy.   Thyroid nodule-we will repeat her ultrasound in February.

## 2015-03-11 ENCOUNTER — Telehealth: Payer: Self-pay | Admitting: Family Medicine

## 2015-03-11 ENCOUNTER — Ambulatory Visit (INDEPENDENT_AMBULATORY_CARE_PROVIDER_SITE_OTHER): Payer: Medicare Other | Admitting: Licensed Clinical Social Worker

## 2015-03-11 DIAGNOSIS — F332 Major depressive disorder, recurrent severe without psychotic features: Secondary | ICD-10-CM

## 2015-03-11 NOTE — Telephone Encounter (Signed)
Call patient: Potassium is okay at 3.8. Just make sure continuing to take potassium supplement. It is just borderline low.

## 2015-03-12 NOTE — Telephone Encounter (Signed)
Pt notified of lab results and to continue taking her supplement. Pt verbalized understanding. No further questions at this time.

## 2015-03-15 ENCOUNTER — Emergency Department (HOSPITAL_BASED_OUTPATIENT_CLINIC_OR_DEPARTMENT_OTHER): Payer: Medicare Other

## 2015-03-15 ENCOUNTER — Encounter (HOSPITAL_BASED_OUTPATIENT_CLINIC_OR_DEPARTMENT_OTHER): Payer: Self-pay | Admitting: *Deleted

## 2015-03-15 ENCOUNTER — Telehealth: Payer: Self-pay | Admitting: *Deleted

## 2015-03-15 ENCOUNTER — Emergency Department (HOSPITAL_BASED_OUTPATIENT_CLINIC_OR_DEPARTMENT_OTHER)
Admission: EM | Admit: 2015-03-15 | Discharge: 2015-03-15 | Disposition: A | Discharge status: Home / Self Care | Payer: Medicare Other | Attending: Emergency Medicine | Admitting: Emergency Medicine

## 2015-03-15 DIAGNOSIS — Z8639 Personal history of other endocrine, nutritional and metabolic disease: Secondary | ICD-10-CM | POA: Diagnosis not present

## 2015-03-15 DIAGNOSIS — K219 Gastro-esophageal reflux disease without esophagitis: Secondary | ICD-10-CM | POA: Diagnosis not present

## 2015-03-15 DIAGNOSIS — R05 Cough: Secondary | ICD-10-CM | POA: Diagnosis not present

## 2015-03-15 DIAGNOSIS — G4730 Sleep apnea, unspecified: Secondary | ICD-10-CM | POA: Diagnosis not present

## 2015-03-15 DIAGNOSIS — Z9889 Other specified postprocedural states: Secondary | ICD-10-CM | POA: Diagnosis not present

## 2015-03-15 DIAGNOSIS — G8929 Other chronic pain: Secondary | ICD-10-CM | POA: Diagnosis not present

## 2015-03-15 DIAGNOSIS — J45909 Unspecified asthma, uncomplicated: Secondary | ICD-10-CM | POA: Diagnosis not present

## 2015-03-15 DIAGNOSIS — R0602 Shortness of breath: Secondary | ICD-10-CM | POA: Diagnosis not present

## 2015-03-15 DIAGNOSIS — I10 Essential (primary) hypertension: Secondary | ICD-10-CM | POA: Diagnosis not present

## 2015-03-15 DIAGNOSIS — Z9981 Dependence on supplemental oxygen: Secondary | ICD-10-CM | POA: Insufficient documentation

## 2015-03-15 DIAGNOSIS — Z79899 Other long term (current) drug therapy: Secondary | ICD-10-CM | POA: Insufficient documentation

## 2015-03-15 DIAGNOSIS — G894 Chronic pain syndrome: Secondary | ICD-10-CM | POA: Diagnosis not present

## 2015-03-15 DIAGNOSIS — J209 Acute bronchitis, unspecified: Secondary | ICD-10-CM | POA: Diagnosis not present

## 2015-03-15 DIAGNOSIS — Z888 Allergy status to other drugs, medicaments and biological substances status: Secondary | ICD-10-CM | POA: Diagnosis not present

## 2015-03-15 DIAGNOSIS — J029 Acute pharyngitis, unspecified: Secondary | ICD-10-CM | POA: Diagnosis not present

## 2015-03-15 DIAGNOSIS — R1084 Generalized abdominal pain: Secondary | ICD-10-CM | POA: Diagnosis not present

## 2015-03-15 DIAGNOSIS — J45901 Unspecified asthma with (acute) exacerbation: Secondary | ICD-10-CM | POA: Diagnosis not present

## 2015-03-15 DIAGNOSIS — J40 Bronchitis, not specified as acute or chronic: Secondary | ICD-10-CM

## 2015-03-15 DIAGNOSIS — R490 Dysphonia: Secondary | ICD-10-CM | POA: Diagnosis not present

## 2015-03-15 DIAGNOSIS — Z880 Allergy status to penicillin: Secondary | ICD-10-CM | POA: Diagnosis not present

## 2015-03-15 DIAGNOSIS — Z885 Allergy status to narcotic agent status: Secondary | ICD-10-CM | POA: Diagnosis not present

## 2015-03-15 DIAGNOSIS — D649 Anemia, unspecified: Secondary | ICD-10-CM | POA: Diagnosis not present

## 2015-03-15 DIAGNOSIS — G35 Multiple sclerosis: Secondary | ICD-10-CM | POA: Diagnosis not present

## 2015-03-15 DIAGNOSIS — Z87442 Personal history of urinary calculi: Secondary | ICD-10-CM | POA: Diagnosis not present

## 2015-03-15 DIAGNOSIS — Z87891 Personal history of nicotine dependence: Secondary | ICD-10-CM | POA: Diagnosis not present

## 2015-03-15 DIAGNOSIS — R739 Hyperglycemia, unspecified: Secondary | ICD-10-CM | POA: Diagnosis not present

## 2015-03-15 DIAGNOSIS — F419 Anxiety disorder, unspecified: Secondary | ICD-10-CM | POA: Diagnosis not present

## 2015-03-15 DIAGNOSIS — Z8542 Personal history of malignant neoplasm of other parts of uterus: Secondary | ICD-10-CM | POA: Diagnosis not present

## 2015-03-15 DIAGNOSIS — F17210 Nicotine dependence, cigarettes, uncomplicated: Secondary | ICD-10-CM | POA: Diagnosis not present

## 2015-03-15 DIAGNOSIS — Z886 Allergy status to analgesic agent status: Secondary | ICD-10-CM | POA: Diagnosis not present

## 2015-03-15 DIAGNOSIS — Z9851 Tubal ligation status: Secondary | ICD-10-CM | POA: Diagnosis not present

## 2015-03-15 DIAGNOSIS — Z8659 Personal history of other mental and behavioral disorders: Secondary | ICD-10-CM | POA: Diagnosis not present

## 2015-03-15 DIAGNOSIS — Z882 Allergy status to sulfonamides status: Secondary | ICD-10-CM | POA: Diagnosis not present

## 2015-03-15 DIAGNOSIS — Z91018 Allergy to other foods: Secondary | ICD-10-CM | POA: Diagnosis not present

## 2015-03-15 DIAGNOSIS — J040 Acute laryngitis: Secondary | ICD-10-CM | POA: Diagnosis not present

## 2015-03-15 DIAGNOSIS — E785 Hyperlipidemia, unspecified: Secondary | ICD-10-CM | POA: Diagnosis not present

## 2015-03-15 DIAGNOSIS — J050 Acute obstructive laryngitis [croup]: Secondary | ICD-10-CM | POA: Diagnosis not present

## 2015-03-15 MED ORDER — DEXAMETHASONE 4 MG PO TABS
12.0000 mg | ORAL_TABLET | Freq: Once | ORAL | Status: AC
  Administered 2015-03-15: 12 mg via ORAL

## 2015-03-15 MED ORDER — DEXAMETHASONE 4 MG PO TABS: 10.0000 mg | ORAL_TABLET | Freq: Once | ORAL | Status: DC

## 2015-03-15 MED ORDER — ALBUTEROL SULFATE HFA 108 (90 BASE) MCG/ACT IN AERS: 2.0000 | INHALATION_SPRAY | Freq: Once | RESPIRATORY_TRACT | Status: DC

## 2015-03-15 MED ORDER — BENZONATATE 100 MG PO CAPS: 100.0000 mg | ORAL_CAPSULE | Freq: Three times a day (TID) | ORAL | Status: DC | PRN

## 2015-03-15 MED ORDER — PREDNISONE 50 MG PO TABS: 60.0000 mg | ORAL_TABLET | Freq: Once | ORAL | Status: DC

## 2015-03-15 MED ORDER — DEXAMETHASONE 4 MG PO TABS
ORAL_TABLET | ORAL | Status: AC
  Filled 2015-03-15: qty 3

## 2015-03-15 MED ORDER — ALBUTEROL SULFATE HFA 108 (90 BASE) MCG/ACT IN AERS
2.0000 | INHALATION_SPRAY | Freq: Once | RESPIRATORY_TRACT | Status: AC
  Administered 2015-03-15: 2 via RESPIRATORY_TRACT
  Filled 2015-03-15: qty 6.7

## 2015-03-15 NOTE — ED Notes (Signed)
Pt here with URI that began yesterday.  Pt reports productive cough and some hoarseness as well as pain with swallowing.  Pt is alert and oriented.  Pt reports mild sob with exertion

## 2015-03-15 NOTE — Discharge Instructions (Signed)
Acute Bronchitis °Bronchitis is inflammation of the airways that extend from the windpipe into the lungs (bronchi). The inflammation often causes mucus to develop. This leads to a cough, which is the most common symptom of bronchitis.  °In acute bronchitis, the condition usually develops suddenly and goes away over time, usually in a couple weeks. Smoking, allergies, and asthma can make bronchitis worse. Repeated episodes of bronchitis may cause further lung problems.  °CAUSES °Acute bronchitis is most often caused by the same virus that causes a cold. The virus can spread from person to person (contagious) through coughing, sneezing, and touching contaminated objects. °SIGNS AND SYMPTOMS  °· Cough.   °· Fever.   °· Coughing up mucus.   °· Body aches.   °· Chest congestion.   °· Chills.   °· Shortness of breath.   °· Sore throat.   °DIAGNOSIS  °Acute bronchitis is usually diagnosed through a physical exam. Your health care provider will also ask you questions about your medical history. Tests, such as chest X-rays, are sometimes done to rule out other conditions.  °TREATMENT  °Acute bronchitis usually goes away in a couple weeks. Oftentimes, no medical treatment is necessary. Medicines are sometimes given for relief of fever or cough. Antibiotic medicines are usually not needed but may be prescribed in certain situations. In some cases, an inhaler may be recommended to help reduce shortness of breath and control the cough. A cool mist vaporizer may also be used to help thin bronchial secretions and make it easier to clear the chest.  °HOME CARE INSTRUCTIONS °· Get plenty of rest.   °· Drink enough fluids to keep your urine clear or pale yellow (unless you have a medical condition that requires fluid restriction). Increasing fluids may help thin your respiratory secretions (sputum) and reduce chest congestion, and it will prevent dehydration.   °· Take medicines only as directed by your health care provider. °· If  you were prescribed an antibiotic medicine, finish it all even if you start to feel better. °· Avoid smoking and secondhand smoke. Exposure to cigarette smoke or irritating chemicals will make bronchitis worse. If you are a smoker, consider using nicotine gum or skin patches to help control withdrawal symptoms. Quitting smoking will help your lungs heal faster.   °· Reduce the chances of another bout of acute bronchitis by washing your hands frequently, avoiding people with cold symptoms, and trying not to touch your hands to your mouth, nose, or eyes.   °· Keep all follow-up visits as directed by your health care provider.   °SEEK MEDICAL CARE IF: °Your symptoms do not improve after 1 week of treatment.  °SEEK IMMEDIATE MEDICAL CARE IF: °· You develop an increased fever or chills.   °· You have chest pain.   °· You have severe shortness of breath. °· You have bloody sputum.   °· You develop dehydration. °· You faint or repeatedly feel like you are going to pass out. °· You develop repeated vomiting. °· You develop a severe headache. °MAKE SURE YOU:  °· Understand these instructions. °· Will watch your condition. °· Will get help right away if you are not doing well or get worse. °Document Released: 09/07/2004 Document Revised: 12/15/2013 Document Reviewed: 01/21/2013 °ExitCare® Patient Information ©2015 [employer]. This information is not intended to replace advice given to you by your health care provider. Make sure you discuss any questions you have with your health care provider. ° °
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
## 2015-03-15 NOTE — Telephone Encounter (Signed)
Pt left vm for a return call.  Went to call pt back & saw that she went to the ED.

## 2015-03-17 ENCOUNTER — Telehealth: Payer: Self-pay | Admitting: Family Medicine

## 2015-03-17 ENCOUNTER — Ambulatory Visit: Payer: Medicare Other | Admitting: Family Medicine

## 2015-03-17 DIAGNOSIS — F418 Other specified anxiety disorders: Secondary | ICD-10-CM | POA: Diagnosis not present

## 2015-03-17 DIAGNOSIS — Z882 Allergy status to sulfonamides status: Secondary | ICD-10-CM | POA: Diagnosis not present

## 2015-03-17 DIAGNOSIS — R0602 Shortness of breath: Secondary | ICD-10-CM | POA: Diagnosis not present

## 2015-03-17 DIAGNOSIS — F458 Other somatoform disorders: Secondary | ICD-10-CM | POA: Diagnosis not present

## 2015-03-17 DIAGNOSIS — Z79899 Other long term (current) drug therapy: Secondary | ICD-10-CM | POA: Diagnosis not present

## 2015-03-17 DIAGNOSIS — Z8542 Personal history of malignant neoplasm of other parts of uterus: Secondary | ICD-10-CM | POA: Diagnosis not present

## 2015-03-17 DIAGNOSIS — Z888 Allergy status to other drugs, medicaments and biological substances status: Secondary | ICD-10-CM | POA: Diagnosis not present

## 2015-03-17 DIAGNOSIS — E785 Hyperlipidemia, unspecified: Secondary | ICD-10-CM | POA: Diagnosis not present

## 2015-03-17 DIAGNOSIS — Z87891 Personal history of nicotine dependence: Secondary | ICD-10-CM | POA: Diagnosis not present

## 2015-03-17 DIAGNOSIS — G35 Multiple sclerosis: Secondary | ICD-10-CM | POA: Diagnosis not present

## 2015-03-17 DIAGNOSIS — J029 Acute pharyngitis, unspecified: Secondary | ICD-10-CM | POA: Diagnosis not present

## 2015-03-17 DIAGNOSIS — I10 Essential (primary) hypertension: Secondary | ICD-10-CM | POA: Diagnosis not present

## 2015-03-17 DIAGNOSIS — K219 Gastro-esophageal reflux disease without esophagitis: Secondary | ICD-10-CM | POA: Diagnosis not present

## 2015-03-17 DIAGNOSIS — B370 Candidal stomatitis: Secondary | ICD-10-CM | POA: Diagnosis not present

## 2015-03-17 DIAGNOSIS — F419 Anxiety disorder, unspecified: Secondary | ICD-10-CM | POA: Diagnosis not present

## 2015-03-17 DIAGNOSIS — Z91018 Allergy to other foods: Secondary | ICD-10-CM | POA: Diagnosis not present

## 2015-03-17 DIAGNOSIS — R0600 Dyspnea, unspecified: Secondary | ICD-10-CM | POA: Diagnosis not present

## 2015-03-17 DIAGNOSIS — J312 Chronic pharyngitis: Secondary | ICD-10-CM | POA: Diagnosis not present

## 2015-03-17 NOTE — Telephone Encounter (Signed)
Call pt: she hasn't schedule her mammo at [facility]. She needs to call them.

## 2015-03-18 DIAGNOSIS — R1013 Epigastric pain: Secondary | ICD-10-CM | POA: Diagnosis not present

## 2015-03-18 DIAGNOSIS — Z9089 Acquired absence of other organs: Secondary | ICD-10-CM | POA: Diagnosis not present

## 2015-03-18 DIAGNOSIS — Z9049 Acquired absence of other specified parts of digestive tract: Secondary | ICD-10-CM | POA: Diagnosis not present

## 2015-03-18 DIAGNOSIS — K760 Fatty (change of) liver, not elsewhere classified: Secondary | ICD-10-CM | POA: Diagnosis not present

## 2015-03-19 ENCOUNTER — Emergency Department (HOSPITAL_COMMUNITY): Payer: Medicare Other

## 2015-03-19 ENCOUNTER — Emergency Department (HOSPITAL_BASED_OUTPATIENT_CLINIC_OR_DEPARTMENT_OTHER)
Admission: EM | Admit: 2015-03-19 | Discharge: 2015-03-19 | Disposition: A | Discharge status: Home / Self Care | Payer: Medicare Other | Attending: Emergency Medicine | Admitting: Emergency Medicine

## 2015-03-19 ENCOUNTER — Telehealth: Payer: Self-pay | Admitting: Emergency Medicine

## 2015-03-19 ENCOUNTER — Encounter (HOSPITAL_BASED_OUTPATIENT_CLINIC_OR_DEPARTMENT_OTHER): Payer: Self-pay | Admitting: *Deleted

## 2015-03-19 DIAGNOSIS — M47816 Spondylosis without myelopathy or radiculopathy, lumbar region: Secondary | ICD-10-CM | POA: Diagnosis not present

## 2015-03-19 DIAGNOSIS — R0602 Shortness of breath: Secondary | ICD-10-CM | POA: Diagnosis not present

## 2015-03-19 DIAGNOSIS — M545 Low back pain, unspecified: Secondary | ICD-10-CM

## 2015-03-19 DIAGNOSIS — J45909 Unspecified asthma, uncomplicated: Secondary | ICD-10-CM | POA: Diagnosis not present

## 2015-03-19 DIAGNOSIS — M549 Dorsalgia, unspecified: Secondary | ICD-10-CM

## 2015-03-19 LAB — COMPREHENSIVE METABOLIC PANEL
ALT: 21 U/L (ref 14–54)
AST: 18 U/L (ref 15–41)
Albumin: 3.7 g/dL (ref 3.5–5.0)
Alkaline Phosphatase: 88 U/L (ref 38–126)
Anion gap: 9 (ref 5–15)
BUN: 13 mg/dL (ref 6–20)
CO2: 26 mmol/L (ref 22–32)
Calcium: 9 mg/dL (ref 8.9–10.3)
Chloride: 107 mmol/L (ref 101–111)
Creatinine, Ser: 0.56 mg/dL (ref 0.44–1.00)
GFR calc Af Amer: >60 mL/min (ref >60)
GFR calc non Af Amer: >60 mL/min (ref >60)
Glucose, Bld: 111 mg/dL — ABNORMAL HIGH (ref 65–99)
Potassium: 3.3 mmol/L — ABNORMAL LOW (ref 3.5–5.1)
Sodium: 142 mmol/L (ref 135–145)
Total Bilirubin: 0.3 mg/dL (ref 0.3–1.2)
Total Protein: 7.1 g/dL (ref 6.5–8.1)

## 2015-03-19 LAB — CBC WITH DIFFERENTIAL/PLATELET
Basophils Absolute: 0 10*3/uL (ref 0.0–0.1)
Basophils Relative: 0 % (ref 0–1)
Eosinophils Absolute: 0.2 10*3/uL (ref 0.0–0.7)
Eosinophils Relative: 3 % (ref 0–5)
HCT: 42.1 % (ref 36.0–46.0)
Hemoglobin: 13.7 g/dL (ref 12.0–15.0)
Lymphocytes Relative: 21 % (ref 12–46)
Lymphs Abs: 1.4 10*3/uL (ref 0.7–4.0)
MCH: 28.7 pg (ref 26.0–34.0)
MCHC: 32.5 g/dL (ref 30.0–36.0)
MCV: 88.3 fL (ref 78.0–100.0)
Monocytes Absolute: 0.7 10*3/uL (ref 0.1–1.0)
Monocytes Relative: 10 % (ref 3–12)
Neutro Abs: 4.5 10*3/uL (ref 1.7–7.7)
Neutrophils Relative %: 66 % (ref 43–77)
Platelets: 221 10*3/uL (ref 150–400)
RBC: 4.77 MIL/uL (ref 3.87–5.11)
RDW: 13.8 % (ref 11.5–15.5)
WBC: 6.9 10*3/uL (ref 4.0–10.5)

## 2015-03-19 LAB — D-DIMER, QUANTITATIVE (NOT AT ARMC): D-Dimer, Quant: 0.31 ug/mL-FEU (ref 0.00–0.48)

## 2015-03-19 LAB — URINALYSIS, ROUTINE W REFLEX MICROSCOPIC
Bilirubin Urine: NEGATIVE
Glucose, UA: NEGATIVE mg/dL
Hgb urine dipstick: NEGATIVE
Ketones, ur: NEGATIVE mg/dL
Leukocytes, UA: NEGATIVE
Nitrite: NEGATIVE
Protein, ur: NEGATIVE mg/dL
Specific Gravity, Urine: 1.025 (ref 1.005–1.030)
Urobilinogen, UA: 0.2 mg/dL (ref 0.0–1.0)
pH: 5.5 (ref 5.0–8.0)

## 2015-03-19 LAB — SEDIMENTATION RATE: Sed Rate: 33 mm/hr — ABNORMAL HIGH (ref 0–22)

## 2015-03-19 LAB — C-REACTIVE PROTEIN: CRP: 1.3 mg/dL — ABNORMAL HIGH (ref <1.0)

## 2015-03-19 MED ORDER — SODIUM CHLORIDE 0.9 % IV BOLUS (SEPSIS)
1000.0000 mL | Freq: Once | INTRAVENOUS | Status: AC
  Administered 2015-03-19: 1000 mL via INTRAVENOUS

## 2015-03-19 MED ORDER — GADOBENATE DIMEGLUMINE 529 MG/ML IV SOLN: 19.0000 mL | Freq: Once | INTRAVENOUS | Status: DC | PRN

## 2015-03-19 NOTE — Discharge Instructions (Signed)
Please read and follow all provided instructions.  Your diagnoses today include:  1. Midline low back pain without sciatica   2. Back pain   3. Shortness of breath     Tests performed today include:  Vital signs - see below for your results today  Blood counts and electrolytes - normal  D-dimer - normal  MRI of lower back - shows bulging disc at L5/S1 but no severe problems.  Medications prescribed:   None  Take any prescribed medications only as directed.  Home care instructions:   Follow any educational materials contained in this packet  Please rest, use ice or heat on your back for the next several days  Do not lift, push, pull anything more than 10 pounds for the next week  Follow-up instructions: Please follow-up with your primary care provider in the next week for further evaluation of your symptoms.   Return instructions:  SEEK IMMEDIATE MEDICAL ATTENTION IF YOU HAVE:  New numbness, tingling, weakness, or problem with the use of your arms or legs  Severe back pain not relieved with medications  Loss control of your bowels or bladder  Increasing pain in any areas of the body (such as chest or abdominal pain)  Shortness of breath, dizziness, or fainting.   Worsening nausea (feeling sick to your stomach), vomiting, fever, or sweats  Any other emergent concerns regarding your health   Additional Information:  Your vital signs today were: BP 147/93 mmHg   Pulse 86   Resp 18   SpO2 97%   LMP 06/25/2013 If your blood pressure (BP) was elevated above 135/85 this visit, please have this repeated by your doctor within one month. --------------

## 2015-03-19 NOTE — ED Notes (Signed)
Pt amb to triage with quick steady gait in nad. Pt reports low back pain x 3 days, causing her to have a headache. Denies any injury or trauma.

## 2015-03-19 NOTE — ED Provider Notes (Signed)
Patient transferred to [facility] from [facility] by POV. Patient with history of back pain presents with worsening of her chronic back pain in her lower back. Patient reported urinary incontinence and was sent to the emergency department for an MRI to rule out more serious pathology after discussing case with her PCP. MRI was performed without any acute findings. Patient has chronic disc degeneration at the L5-S1 level.  Upon speaking with the patient further, she reports generalized fatigue and malaise which she relates to her history of MS. However she also reports much worse shortness of breath and decreased exercise tolerance especially over the past one month. Patient states that she was able to do 30 minutes on a stairmaster one month ago but has since been able to do very little exercise without becoming short of breath. No history of PE. No lower extremity swelling. Patient does have some generalized chest tightness at times. For this reason a chest x-ray and d-dimer were ordered. These were negative.  Patient informed of all results. She is to follow-up with her PCP on Monday as planned. Also encouraged follow-up with her neurologist to discuss if these symptoms could be related to her MS. Patient verbalizes understanding and agrees with plan.  10:48 PM Exam:  Gen NAD; Heart RRR, nml S1,S2, no m/r/g; Lungs CTAB; Abd soft, NT, no rebound or guarding; Back paraspinous and mid-line tenderness over the lumbar spine; Ext 2+ pedal pulses bilaterally, no edema.  Vital signs reviewed and are as follows: Filed Vitals:   03/19/15 2215  BP: 147/93  Pulse: 86  Resp: 18   Results for orders placed or performed during the hospital encounter of 03/19/15 (from the past 24 hour(s))  Urinalysis, Routine w reflex microscopic-may I&O cath if menses (not at [facility])     Status: Abnormal   Collection Time: 03/19/15  1:40 PM  Result Value Ref Range   Color, Urine YELLOW YELLOW   APPearance CLOUDY (A)  CLEAR   Specific Gravity, Urine 1.025 1.005 - 1.030   pH 5.5 5.0 - 8.0   Glucose, UA NEGATIVE NEGATIVE mg/dL   Hgb urine dipstick NEGATIVE NEGATIVE   Bilirubin Urine NEGATIVE NEGATIVE   Ketones, ur NEGATIVE NEGATIVE mg/dL   Protein, ur NEGATIVE NEGATIVE mg/dL   Urobilinogen, UA 0.2 0.0 - 1.0 mg/dL   Nitrite NEGATIVE NEGATIVE   Leukocytes, UA NEGATIVE NEGATIVE  CBC with Differential     Status: None   Collection Time: 03/19/15  1:50 PM  Result Value Ref Range   WBC 6.9 4.0 - 10.5 K/uL   RBC 4.77 3.87 - 5.11 MIL/uL   Hemoglobin 13.7 12.0 - 15.0 g/dL   HCT 42.1 36.0 - 46.0 %   MCV 88.3 78.0 - 100.0 fL   MCH 28.7 26.0 - 34.0 pg   MCHC 32.5 30.0 - 36.0 g/dL   RDW 13.8 11.5 - 15.5 %   Platelets 221 150 - 400 K/uL   Neutrophils Relative % 66 43 - 77 %   Neutro Abs 4.5 1.7 - 7.7 K/uL   Lymphocytes Relative 21 12 - 46 %   Lymphs Abs 1.4 0.7 - 4.0 K/uL   Monocytes Relative 10 3 - 12 %   Monocytes Absolute 0.7 0.1 - 1.0 K/uL   Eosinophils Relative 3 0 - 5 %   Eosinophils Absolute 0.2 0.0 - 0.7 K/uL   Basophils Relative 0 0 - 1 %   Basophils Absolute 0.0 0.0 - 0.1 K/uL  Comprehensive metabolic panel  Status: Abnormal   Collection Time: 03/19/15  1:50 PM  Result Value Ref Range   Sodium 142 135 - 145 mmol/L   Potassium 3.3 (L) 3.5 - 5.1 mmol/L   Chloride 107 101 - 111 mmol/L   CO2 26 22 - 32 mmol/L   Glucose, Bld 111 (H) 65 - 99 mg/dL   BUN 13 6 - 20 mg/dL   Creatinine, Ser 0.56 0.44 - 1.00 mg/dL   Calcium 9.0 8.9 - 10.3 mg/dL   Total Protein 7.1 6.5 - 8.1 g/dL   Albumin 3.7 3.5 - 5.0 g/dL   AST 18 15 - 41 U/L   ALT 21 14 - 54 U/L   Alkaline Phosphatase 88 38 - 126 U/L   Total Bilirubin 0.3 0.3 - 1.2 mg/dL   GFR calc non Af Amer >60 >60 mL/min   GFR calc Af Amer >60 >60 mL/min   Anion gap 9 5 - 15  Sedimentation rate     Status: Abnormal   Collection Time: 03/19/15  1:50 PM  Result Value Ref Range   Sed Rate 33 (H) 0 - 22 mm/hr  C-reactive protein     Status: Abnormal    Collection Time: 03/19/15  1:50 PM  Result Value Ref Range   CRP 1.3 (H) <1.0 mg/dL  D-dimer, quantitative (not at [facility])     Status: None   Collection Time: 03/19/15 10:00 PM  Result Value Ref Range   D-Dimer, Quant 0.31 0.00 - 0.48 ug/mL-FEU   *Note: Due to a large number of results and/or encounters for the requested time period, some results have not been displayed. A complete set of results can be found in Results Review.    Carlisle Cater, PA-C 03/19/15 2251  Ezequiel Essex, MD 03/20/15 [phone]

## 2015-03-19 NOTE — ED Notes (Signed)
Bladder scanned patient at 1400, Patient measured 0ML, RN Trilby notified.
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
## 2015-03-24 ENCOUNTER — Ambulatory Visit (INDEPENDENT_AMBULATORY_CARE_PROVIDER_SITE_OTHER): Payer: Medicare Other | Admitting: Family Medicine

## 2015-03-24 ENCOUNTER — Encounter: Payer: Self-pay | Admitting: Family Medicine

## 2015-03-24 VITALS — BP 141/87 | HR 95 | Temp 98.5°F | Wt 194.0 lb

## 2015-03-24 DIAGNOSIS — M5136 Other intervertebral disc degeneration, lumbar region: Secondary | ICD-10-CM

## 2015-03-24 DIAGNOSIS — J309 Allergic rhinitis, unspecified: Secondary | ICD-10-CM | POA: Diagnosis not present

## 2015-03-24 DIAGNOSIS — M25511 Pain in right shoulder: Secondary | ICD-10-CM

## 2015-03-24 DIAGNOSIS — B370 Candidal stomatitis: Secondary | ICD-10-CM | POA: Diagnosis not present

## 2015-03-24 MED ORDER — LIDOCAINE 5 % EX PTCH: 1.0000 | MEDICATED_PATCH | CUTANEOUS | Status: DC

## 2015-03-24 MED ORDER — LANSOPRAZOLE 30 MG PO TBDP: 30.0000 mg | ORAL_TABLET | Freq: Every day | ORAL | Status: DC

## 2015-03-24 MED ORDER — FEXOFENADINE HCL 180 MG PO TABS: 180.0000 mg | ORAL_TABLET | Freq: Every day | ORAL | Status: DC

## 2015-03-24 NOTE — Progress Notes (Signed)
   Subjective:    Patient ID: Jennifer [name], female    DOB: 1966-06-17, 49 y.o.   MRN: [id number]  HPI Martin Majestic to the ED recently for low back pain.  Noticing some urinary leakage with exercise and sneezing. They did a UA 5 days ago and it was neg.  They did an MRI and told she had a bulging disc. Has also been having some right shoulder pain. She had been scrubbing some stain out of her carpet.  Has been using Tylenol.  She had some numbness in her fingers.  Has also been getting some pricking sensation in her toes on the right foot.    Still has thrush. She is repeating the nystatin swish and swallow. Has f/u with ENT next week.  Feels like she is having a hard time breathing bc of thickness in her throat and nose.  Getting white nasal dischare from the nose. Occ cough but not persistant.  She still occasionally gets a fullness sensation in her throat.  She is going to try to go back to the gym this weak.    Was using lidoderm patch previously on her shoulder and really helped would like to try that again.    She did recently see a therapist and they have recommended that she work with someone who is particularly trained in traumatic stress. She has not heard back from them yet. I encouraged her to give them a call next week if she hasn't heard from them. I do agree that things she would benefit from some IV specialist some PTSD.  Review of Systems     Objective:   Physical Exam  Constitutional: She is oriented to person, place, and time. She appears well-developed and well-nourished.  HENT:  Head: Normocephalic and atraumatic.  Right Ear: External ear normal.  Left Ear: External ear normal.  Nose: Nose normal.  Mouth/Throat: Oropharynx is clear and moist.  Eyes: Conjunctivae and EOM are normal. Pupils are equal, round, and reactive to light.  Neck: Neck supple. No thyromegaly present.  Cardiovascular: Normal rate, regular rhythm and normal heart sounds.   Pulmonary/Chest: Effort  normal and breath sounds normal. She has no wheezes.  Musculoskeletal:  Right shoulder with tenderness along the lateal edge.  Pain with full extension.  Elbow and wrist with NROM   Lymphadenopathy:    She has no cervical adenopathy.  Neurological: She is alert and oriented to person, place, and time.  Skin: Skin is warm and dry.  Psychiatric: She has a normal mood and affect.          Assessment & Plan:  DDD L5-S1 - discussed options. I really think she'll benefit from physical therapy. Encouraged her to consider it. We have to make referral at she's willing to do so. Because of reflux issues I recommended she avoid proton pump inhibitors. Stick with Tylenol for pain relief for now.  AR - Recommend restart allegra. Already on nasal steroid spray.    Right shoulder pain consistent with bursitis- will try to get the lidoderm patch covered with insurance.  Also given handout on stretches to do on her on at home.  Thrush-continue complete course of nystatin swish and swallow. She is to follow with ENT next week to reevaluate.  Time spent 45 minutes, greater than 50% time spent discussing her back pain, allergies, cough, shorter pain, and thrush.

## 2015-03-25 ENCOUNTER — Ambulatory Visit: Payer: Medicare Other | Admitting: Licensed Clinical Social Worker

## 2015-03-25 DIAGNOSIS — G35 Multiple sclerosis: Secondary | ICD-10-CM | POA: Diagnosis not present

## 2015-03-25 DIAGNOSIS — F458 Other somatoform disorders: Secondary | ICD-10-CM | POA: Diagnosis not present

## 2015-03-26 DIAGNOSIS — Z87891 Personal history of nicotine dependence: Secondary | ICD-10-CM | POA: Diagnosis not present

## 2015-03-26 DIAGNOSIS — J45909 Unspecified asthma, uncomplicated: Secondary | ICD-10-CM | POA: Diagnosis not present

## 2015-03-26 DIAGNOSIS — M25511 Pain in right shoulder: Secondary | ICD-10-CM | POA: Diagnosis not present

## 2015-03-26 DIAGNOSIS — I10 Essential (primary) hypertension: Secondary | ICD-10-CM | POA: Diagnosis not present

## 2015-03-26 DIAGNOSIS — F329 Major depressive disorder, single episode, unspecified: Secondary | ICD-10-CM | POA: Diagnosis not present

## 2015-03-26 DIAGNOSIS — G8929 Other chronic pain: Secondary | ICD-10-CM | POA: Diagnosis not present

## 2015-03-28 IMAGING — CR DG CHEST 2V
2 series · 2 of 2 positions shown · non-contrast
Comparison: None.

CLINICAL DATA: Chest pain.

EXAM:
CHEST  2 VIEW

[w chest pa]
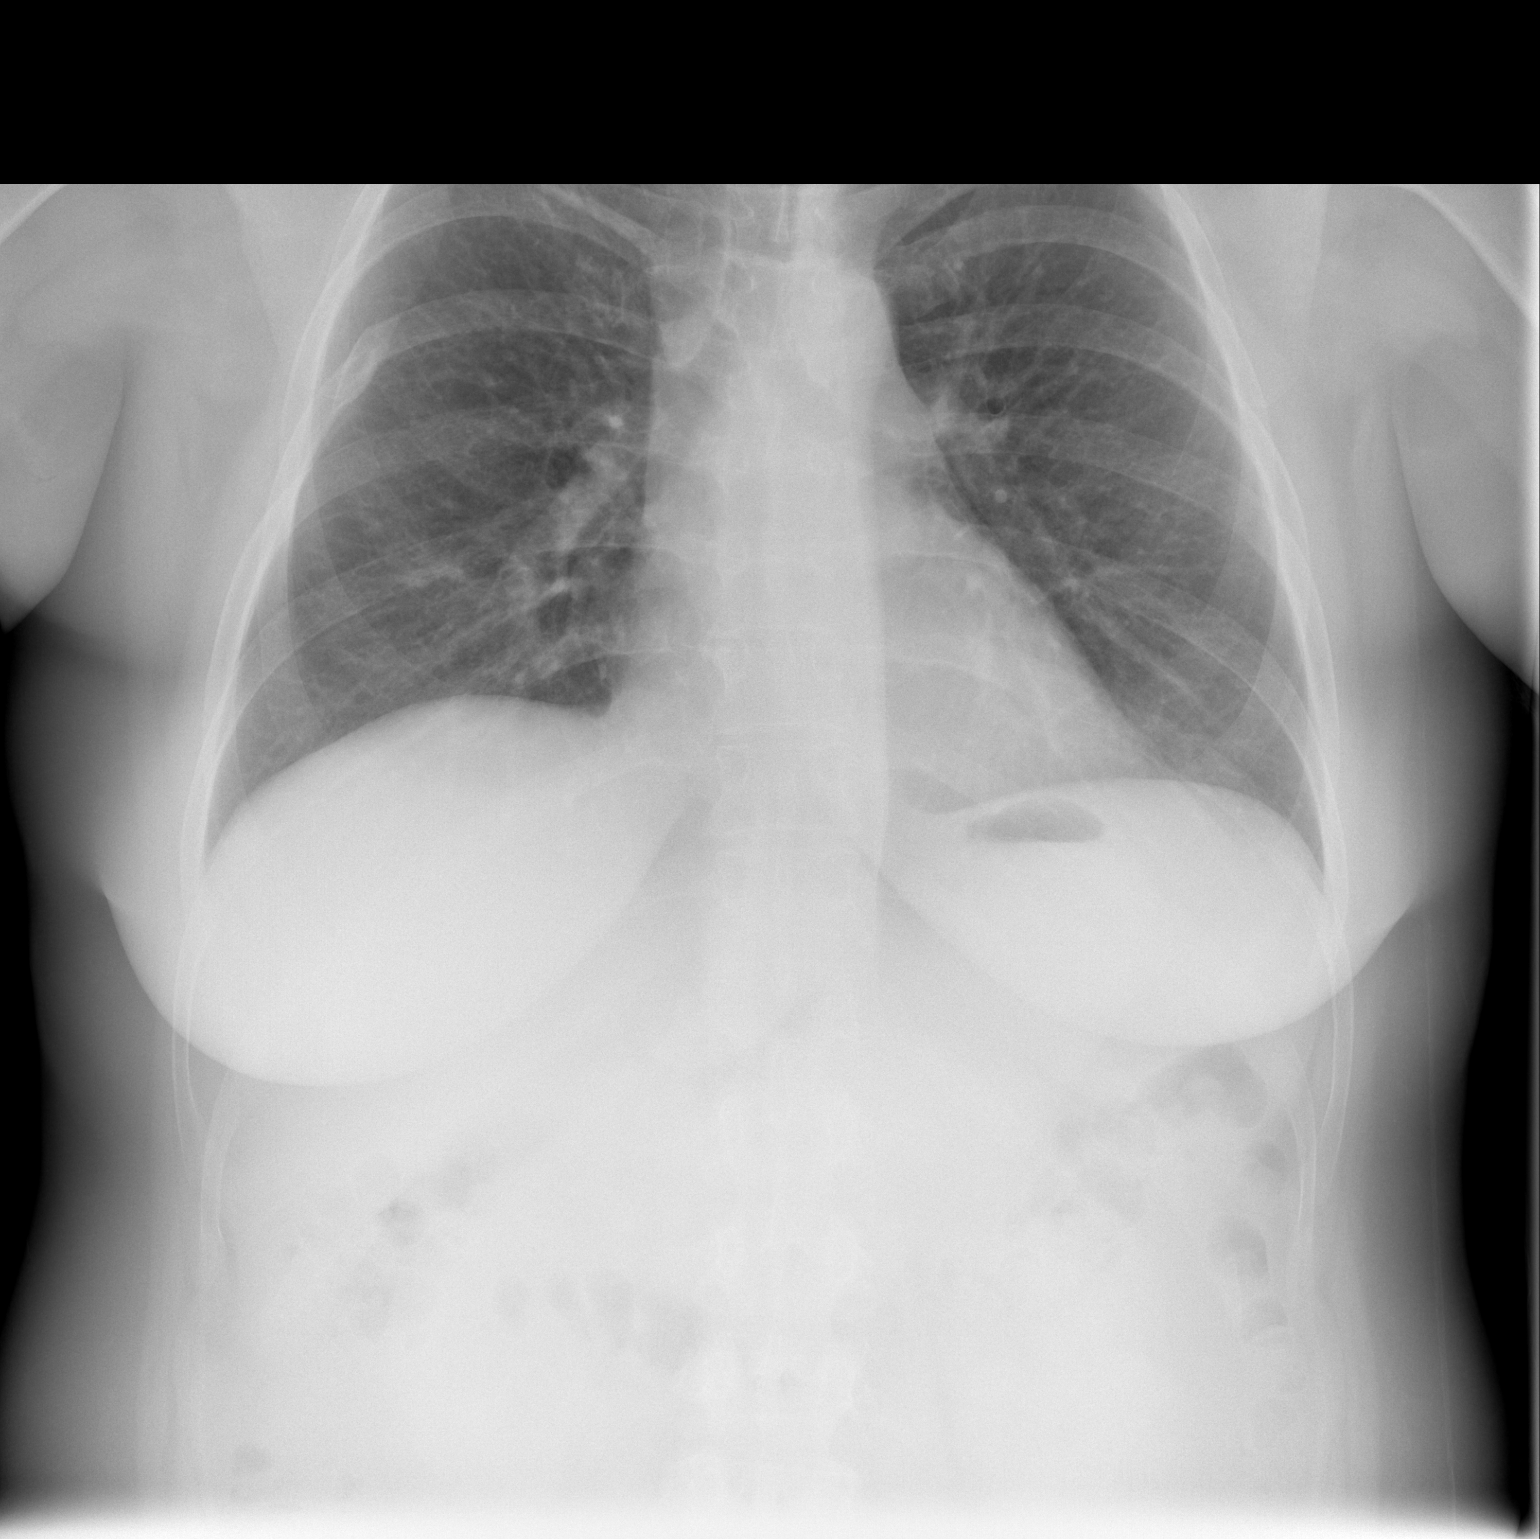

[w chest lat]
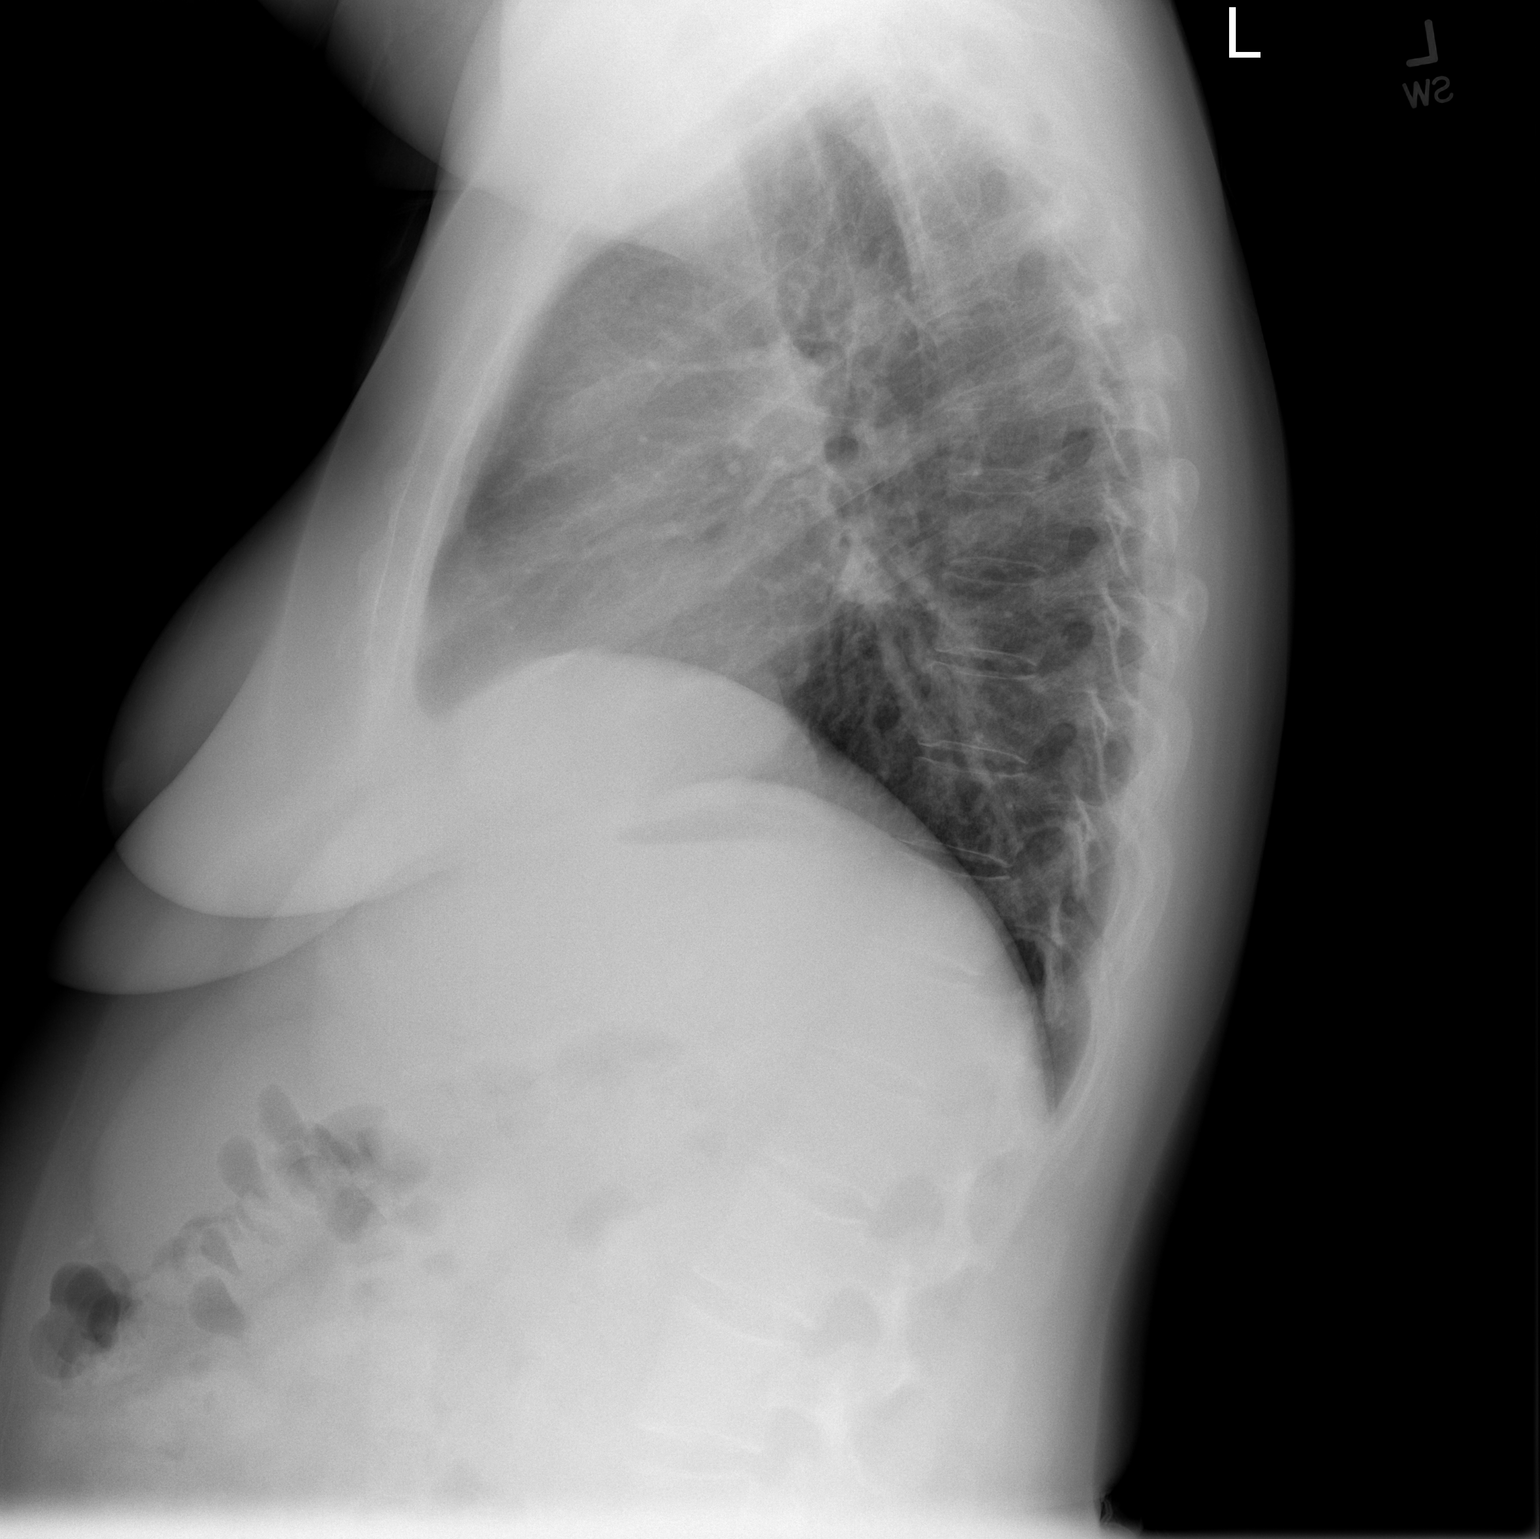

[2 of 2 positions shown; findings below may reference images not displayed]

FINDINGS: The heart size and mediastinal contours are within normal limits.
Both lungs are clear. There is a remote healed fracture of the right
fifth rib. No acute bony abnormality. The visualized bowel gas
pattern is nonobstructive.
IMPRESSION: No active cardiopulmonary disease.

## 2015-03-29 ENCOUNTER — Telehealth: Payer: Self-pay | Admitting: Family Medicine

## 2015-03-29 ENCOUNTER — Encounter: Payer: Self-pay | Admitting: Family Medicine

## 2015-03-29 ENCOUNTER — Other Ambulatory Visit: Payer: Self-pay

## 2015-03-29 DIAGNOSIS — Z79899 Other long term (current) drug therapy: Secondary | ICD-10-CM | POA: Diagnosis not present

## 2015-03-29 DIAGNOSIS — M5412 Radiculopathy, cervical region: Secondary | ICD-10-CM | POA: Diagnosis not present

## 2015-03-29 DIAGNOSIS — K219 Gastro-esophageal reflux disease without esophagitis: Secondary | ICD-10-CM | POA: Diagnosis not present

## 2015-03-29 DIAGNOSIS — M25511 Pain in right shoulder: Secondary | ICD-10-CM | POA: Diagnosis not present

## 2015-03-29 DIAGNOSIS — Z791 Long term (current) use of non-steroidal anti-inflammatories (NSAID): Secondary | ICD-10-CM | POA: Diagnosis not present

## 2015-03-29 DIAGNOSIS — M13811 Other specified arthritis, right shoulder: Secondary | ICD-10-CM | POA: Diagnosis not present

## 2015-03-29 DIAGNOSIS — Z91018 Allergy to other foods: Secondary | ICD-10-CM | POA: Diagnosis not present

## 2015-03-29 DIAGNOSIS — Z885 Allergy status to narcotic agent status: Secondary | ICD-10-CM | POA: Diagnosis not present

## 2015-03-29 DIAGNOSIS — F329 Major depressive disorder, single episode, unspecified: Secondary | ICD-10-CM | POA: Diagnosis not present

## 2015-03-29 DIAGNOSIS — E785 Hyperlipidemia, unspecified: Secondary | ICD-10-CM | POA: Diagnosis not present

## 2015-03-29 DIAGNOSIS — M19011 Primary osteoarthritis, right shoulder: Secondary | ICD-10-CM | POA: Diagnosis not present

## 2015-03-29 DIAGNOSIS — F419 Anxiety disorder, unspecified: Secondary | ICD-10-CM | POA: Diagnosis not present

## 2015-03-29 DIAGNOSIS — R569 Unspecified convulsions: Secondary | ICD-10-CM | POA: Diagnosis not present

## 2015-03-29 DIAGNOSIS — Z7951 Long term (current) use of inhaled steroids: Secondary | ICD-10-CM | POA: Diagnosis not present

## 2015-03-29 DIAGNOSIS — Z87891 Personal history of nicotine dependence: Secondary | ICD-10-CM | POA: Diagnosis not present

## 2015-03-29 DIAGNOSIS — R52 Pain, unspecified: Secondary | ICD-10-CM | POA: Diagnosis not present

## 2015-03-29 DIAGNOSIS — Z79891 Long term (current) use of opiate analgesic: Secondary | ICD-10-CM | POA: Diagnosis not present

## 2015-03-29 DIAGNOSIS — Z8542 Personal history of malignant neoplasm of other parts of uterus: Secondary | ICD-10-CM | POA: Diagnosis not present

## 2015-03-29 DIAGNOSIS — Z881 Allergy status to other antibiotic agents status: Secondary | ICD-10-CM | POA: Diagnosis not present

## 2015-03-29 DIAGNOSIS — J45909 Unspecified asthma, uncomplicated: Secondary | ICD-10-CM | POA: Diagnosis not present

## 2015-03-29 DIAGNOSIS — I10 Essential (primary) hypertension: Secondary | ICD-10-CM | POA: Diagnosis not present

## 2015-03-29 DIAGNOSIS — Z888 Allergy status to other drugs, medicaments and biological substances status: Secondary | ICD-10-CM | POA: Diagnosis not present

## 2015-03-29 DIAGNOSIS — Z882 Allergy status to sulfonamides status: Secondary | ICD-10-CM | POA: Diagnosis not present

## 2015-03-29 DIAGNOSIS — G35 Multiple sclerosis: Secondary | ICD-10-CM | POA: Diagnosis not present

## 2015-03-29 DIAGNOSIS — Z880 Allergy status to penicillin: Secondary | ICD-10-CM | POA: Diagnosis not present

## 2015-03-29 NOTE — Progress Notes (Signed)
Left message for Pt informing OK to use the diclofenac patches and informed we are in the process of getting her lidocaine patches approved but they are not approved yet. Provided callback information for any further questions.

## 2015-03-29 NOTE — Progress Notes (Unsigned)
I think this is OK. It works locally and can provide her some pain relief. Thank you for giving her the ice pack.

## 2015-03-29 NOTE — Telephone Encounter (Signed)
PA:  Lidocaine patch: Case ID# [id number], sent to clinical review. Will receive decision via fax.  Prevacid: Authorization #:[id number], valid 03/29/15-08/14/15. Pharmacy notified.

## 2015-03-29 NOTE — Progress Notes (Unsigned)
Pt walked into clinic today stating her Right Shoulder has been giving her extreme pain. She went to ED last night and an Orthopedist this am regarding the issue. Ortho gave her an Rx for diclofenac (FLECTOR) 1.3 % PTCH, Pt questioned if this was safe for her to take. Upon review of allergy list there is not one listed regarding this new Rx. Will route to PCP for review to make sure. Pt was also given an ice pack while in office today to try and ease her pain.

## 2015-03-30 DIAGNOSIS — R52 Pain, unspecified: Secondary | ICD-10-CM | POA: Diagnosis not present

## 2015-03-30 DIAGNOSIS — R079 Chest pain, unspecified: Secondary | ICD-10-CM | POA: Diagnosis not present

## 2015-03-30 DIAGNOSIS — S43491A Other sprain of right shoulder joint, initial encounter: Secondary | ICD-10-CM | POA: Diagnosis not present

## 2015-03-30 DIAGNOSIS — M25511 Pain in right shoulder: Secondary | ICD-10-CM | POA: Diagnosis not present

## 2015-03-30 DIAGNOSIS — M19011 Primary osteoarthritis, right shoulder: Secondary | ICD-10-CM | POA: Diagnosis not present

## 2015-03-30 DIAGNOSIS — R0789 Other chest pain: Secondary | ICD-10-CM | POA: Diagnosis not present

## 2015-03-31 ENCOUNTER — Ambulatory Visit (INDEPENDENT_AMBULATORY_CARE_PROVIDER_SITE_OTHER): Payer: Medicare Other | Admitting: Family Medicine

## 2015-03-31 ENCOUNTER — Encounter: Payer: Self-pay | Admitting: Family Medicine

## 2015-03-31 VITALS — BP 144/89 | HR 106 | Wt 194.0 lb

## 2015-03-31 DIAGNOSIS — I10 Essential (primary) hypertension: Secondary | ICD-10-CM | POA: Diagnosis not present

## 2015-03-31 DIAGNOSIS — M25511 Pain in right shoulder: Secondary | ICD-10-CM

## 2015-03-31 MED ORDER — HYDROCODONE-ACETAMINOPHEN 5-325 MG PO TABS: 0.5000 | ORAL_TABLET | Freq: Four times a day (QID) | ORAL | Status: DC | PRN

## 2015-03-31 NOTE — Progress Notes (Signed)
   Subjective:    Patient ID: Jennifer [name], female    DOB: 1965-08-27, 49 y.o.   MRN: [id number]  HPI Having Right shoulder pain- she had an MRI on 03/30/15 with contrast.  Says the pain has been severe.  Very painful with moving he shoulder. She has been itching it.  She says the diclofenac patch for her shoulder caused a reaction.  Says her right arm and hand have been swollen as well.  Pain is mostly over the the left shoulder.  Say her pain right now is 5/10.   She was given a perception for pain medication emergency department but never filled it. She's had multiple side effects to drugs and is very nervous about taking something like that but at the same time she says her pain is quite severe.   blood pressure has still been little borderline elevated though some pressures at home have been normal. She has not started the blood pressure medication and wants no what she should do.  Review of Systems     Objective:   Physical Exam  Constitutional: She appears well-developed and well-nourished.  HENT:  Head: Normocephalic and atraumatic.  Musculoskeletal:  Right shoulder is tender along the border of the acromion. She has pain when she gets to about 30 of extension. Nontender over the elbow and wrist. She does have some trace edema of the fingers and hand. Radial pulses 2+.  Skin: Skin is warm.  Psychiatric: She has a normal mood and affect.          Assessment & Plan:   right shoulder pain/posterior labrum tear - she is Re: Connected with orthopedic practice. They're going to schedule her with an orthopedist to possibly discuss surgery. If she does not hear from them by the end of day today than encourage her to call tomorrow. I did review her results and care everywhere. For the short-term will give her perception for hydrocodone to use as needed. Encouraged her start with half a tab or even quarter of a tab. Warned about potential for sedation and constipation. She may need to  use a stool softener with it.   Hypertension-encourage her to just continue to hold off on the blood pressure medication at this point time. She's actually in a lot of pain today which could be elevating her pressure and right now  I think if she's going to try the hydrocodone does want to mix medications so that we don't know which medicine is causing a potential side effect.

## 2015-04-01 NOTE — Telephone Encounter (Signed)
Left a message for a return call for date of scheduled mammogram.

## 2015-04-05 ENCOUNTER — Ambulatory Visit (INDEPENDENT_AMBULATORY_CARE_PROVIDER_SITE_OTHER): Payer: Medicare Other | Admitting: Osteopathic Medicine

## 2015-04-05 ENCOUNTER — Encounter: Payer: Self-pay | Admitting: Osteopathic Medicine

## 2015-04-05 VITALS — BP 154/88 | HR 101 | Temp 99.2°F | Wt 193.0 lb

## 2015-04-05 DIAGNOSIS — J0120 Acute ethmoidal sinusitis, unspecified: Secondary | ICD-10-CM | POA: Diagnosis not present

## 2015-04-05 DIAGNOSIS — Z889 Allergy status to unspecified drugs, medicaments and biological substances status: Secondary | ICD-10-CM

## 2015-04-05 DIAGNOSIS — I10 Essential (primary) hypertension: Secondary | ICD-10-CM | POA: Diagnosis not present

## 2015-04-05 DIAGNOSIS — Z9109 Other allergy status, other than to drugs and biological substances: Secondary | ICD-10-CM | POA: Diagnosis not present

## 2015-04-05 MED ORDER — MOMETASONE FUROATE 50 MCG/ACT NA SUSP: 2.0000 | Freq: Every day | NASAL | Status: DC

## 2015-04-05 MED ORDER — OXYMETAZOLINE HCL 0.05 % NA SOLN
1.0000 | Freq: Two times a day (BID) | NASAL | Status: DC | PRN
Start: 2015-04-05 — End: 2015-05-14

## 2015-04-05 MED ORDER — CLINDAMYCIN HCL 300 MG PO CAPS: 300.0000 mg | ORAL_CAPSULE | Freq: Three times a day (TID) | ORAL | Status: DC
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
## 2015-04-05 NOTE — Patient Instructions (Signed)
Sinusitis Sinusitis is redness, soreness, and inflammation of the paranasal sinuses. Paranasal sinuses are air pockets within the bones of your face (beneath the eyes, the middle of the forehead, or above the eyes). In healthy paranasal sinuses, mucus is able to drain out, and air is able to circulate through them by way of your nose. However, when your paranasal sinuses are inflamed, mucus and air can become trapped. This can allow bacteria and other germs to grow and cause infection. Sinusitis can develop quickly and last only a short time (acute) or continue over a long period (chronic). Sinusitis that lasts for more than 12 weeks is considered chronic.  CAUSES  Causes of sinusitis include:  Allergies.  Structural abnormalities, such as displacement of the cartilage that separates your nostrils (deviated septum), which can decrease the air flow through your nose and sinuses and affect sinus drainage.  Functional abnormalities, such as when the small hairs (cilia) that line your sinuses and help remove mucus do not work properly or are not present. SIGNS AND SYMPTOMS  Symptoms of acute and chronic sinusitis are the same. The primary symptoms are pain and pressure around the affected sinuses. Other symptoms include:  Upper toothache.  Earache.  Headache.  Bad breath.  Decreased sense of smell and taste.  A cough, which worsens when you are lying flat.  Fatigue.  Fever.  Thick drainage from your nose, which often is green and may contain pus (purulent).  Swelling and warmth over the affected sinuses. DIAGNOSIS  Your health care provider will perform a physical exam. During the exam, your health care provider may:  Look in your nose for signs of abnormal growths in your nostrils (nasal polyps).  Tap over the affected sinus to check for signs of infection.  View the inside of your sinuses (endoscopy) using an imaging device that has a light attached (endoscope). If your health  care provider suspects that you have chronic sinusitis, one or more of the following tests may be recommended:  Allergy tests.  Nasal culture. A sample of mucus is taken from your nose, sent to a lab, and screened for bacteria.  Nasal cytology. A sample of mucus is taken from your nose and examined by your health care provider to determine if your sinusitis is related to an allergy. TREATMENT  Most cases of acute sinusitis are related to a viral infection and will resolve on their own within 10 days. Sometimes medicines are prescribed to help relieve symptoms (pain medicine, decongestants, nasal steroid sprays, or saline sprays).  However, for sinusitis related to a bacterial infection, your health care provider will prescribe antibiotic medicines. These are medicines that will help kill the bacteria causing the infection.  Rarely, sinusitis is caused by a fungal infection. In theses cases, your health care provider will prescribe antifungal medicine. For some cases of chronic sinusitis, surgery is needed. Generally, these are cases in which sinusitis recurs more than 3 times per year, despite other treatments. HOME CARE INSTRUCTIONS   Drink plenty of water. Water helps thin the mucus so your sinuses can drain more easily.  Use a humidifier.  Inhale steam 3 to 4 times a day (for example, sit in the bathroom with the shower running).  Apply a warm, moist washcloth to your face 3 to 4 times a day, or as directed by your health care provider.  Use saline nasal sprays to help moisten and clean your sinuses.  Take medicines only as directed by your health care provider.    If you were prescribed either an antibiotic or antifungal medicine, finish it all even if you start to feel better. SEEK IMMEDIATE MEDICAL CARE IF:  You have increasing pain or severe headaches.  You have nausea, vomiting, or drowsiness.  You have swelling around your face.  You have vision problems.  You have a stiff  neck.  You have difficulty breathing. MAKE SURE YOU:   Understand these instructions.  Will watch your condition.  Will get help right away if you are not doing well or get worse. Document Released: 07/31/2005 Document Revised: 12/15/2013 Document Reviewed: 08/15/2011 Exit[employer] Patient Information 2015 [employer]. This information is not intended to replace advice given to you by your health care provider. Make sure you discuss any questions you have with your health care provider.  

## 2015-04-06 DIAGNOSIS — R05 Cough: Secondary | ICD-10-CM | POA: Diagnosis not present

## 2015-04-06 DIAGNOSIS — R0789 Other chest pain: Secondary | ICD-10-CM | POA: Diagnosis not present

## 2015-04-06 DIAGNOSIS — Z859 Personal history of malignant neoplasm, unspecified: Secondary | ICD-10-CM | POA: Diagnosis not present

## 2015-04-06 DIAGNOSIS — R0602 Shortness of breath: Secondary | ICD-10-CM | POA: Diagnosis not present

## 2015-04-06 DIAGNOSIS — G894 Chronic pain syndrome: Secondary | ICD-10-CM | POA: Diagnosis not present

## 2015-04-06 DIAGNOSIS — J029 Acute pharyngitis, unspecified: Secondary | ICD-10-CM | POA: Diagnosis not present

## 2015-04-06 DIAGNOSIS — Z87891 Personal history of nicotine dependence: Secondary | ICD-10-CM | POA: Diagnosis not present

## 2015-04-06 DIAGNOSIS — J00 Acute nasopharyngitis [common cold]: Secondary | ICD-10-CM | POA: Diagnosis not present

## 2015-04-06 DIAGNOSIS — R490 Dysphonia: Secondary | ICD-10-CM | POA: Diagnosis not present

## 2015-04-06 DIAGNOSIS — Z885 Allergy status to narcotic agent status: Secondary | ICD-10-CM | POA: Diagnosis not present

## 2015-04-06 DIAGNOSIS — G40909 Epilepsy, unspecified, not intractable, without status epilepticus: Secondary | ICD-10-CM | POA: Diagnosis not present

## 2015-04-06 DIAGNOSIS — R0981 Nasal congestion: Secondary | ICD-10-CM | POA: Diagnosis not present

## 2015-04-06 DIAGNOSIS — I10 Essential (primary) hypertension: Secondary | ICD-10-CM | POA: Diagnosis not present

## 2015-04-06 DIAGNOSIS — G35 Multiple sclerosis: Secondary | ICD-10-CM | POA: Diagnosis not present

## 2015-04-06 DIAGNOSIS — Z888 Allergy status to other drugs, medicaments and biological substances status: Secondary | ICD-10-CM | POA: Diagnosis not present

## 2015-04-06 DIAGNOSIS — J45909 Unspecified asthma, uncomplicated: Secondary | ICD-10-CM | POA: Diagnosis not present

## 2015-04-06 DIAGNOSIS — J069 Acute upper respiratory infection, unspecified: Secondary | ICD-10-CM | POA: Diagnosis not present

## 2015-04-06 DIAGNOSIS — Z880 Allergy status to penicillin: Secondary | ICD-10-CM | POA: Diagnosis not present

## 2015-04-06 DIAGNOSIS — R079 Chest pain, unspecified: Secondary | ICD-10-CM | POA: Diagnosis not present

## 2015-04-06 DIAGNOSIS — Z886 Allergy status to analgesic agent status: Secondary | ICD-10-CM | POA: Diagnosis not present

## 2015-04-07 DIAGNOSIS — R1319 Other dysphagia: Secondary | ICD-10-CM | POA: Diagnosis not present

## 2015-04-07 DIAGNOSIS — F418 Other specified anxiety disorders: Secondary | ICD-10-CM | POA: Diagnosis not present

## 2015-04-07 DIAGNOSIS — J312 Chronic pharyngitis: Secondary | ICD-10-CM | POA: Diagnosis not present

## 2015-04-07 DIAGNOSIS — R490 Dysphonia: Secondary | ICD-10-CM | POA: Diagnosis not present

## 2015-04-07 DIAGNOSIS — K117 Disturbances of salivary secretion: Secondary | ICD-10-CM | POA: Diagnosis not present

## 2015-04-08 DIAGNOSIS — J4540 Moderate persistent asthma, uncomplicated: Secondary | ICD-10-CM | POA: Diagnosis not present

## 2015-04-08 DIAGNOSIS — J309 Allergic rhinitis, unspecified: Secondary | ICD-10-CM | POA: Diagnosis not present

## 2015-04-09 DIAGNOSIS — M25511 Pain in right shoulder: Secondary | ICD-10-CM | POA: Diagnosis not present

## 2015-04-09 DIAGNOSIS — M542 Cervicalgia: Secondary | ICD-10-CM | POA: Diagnosis not present

## 2015-04-09 NOTE — Telephone Encounter (Signed)
Jennifer [name] states the ENT doesn't want her to be exposed to any more x-rays. She would like Dr Madilyn Fireman talk to the Dr Warren Lacy.

## 2015-04-10 NOTE — Telephone Encounter (Signed)
I think keeping imagine to a minum is a good idea unless it is really going ot change the outcome.  She does need a mammogram and I know he would be ok with that.

## 2015-04-12 DIAGNOSIS — Z8542 Personal history of malignant neoplasm of other parts of uterus: Secondary | ICD-10-CM | POA: Diagnosis not present

## 2015-04-12 DIAGNOSIS — J45909 Unspecified asthma, uncomplicated: Secondary | ICD-10-CM | POA: Diagnosis not present

## 2015-04-12 DIAGNOSIS — I10 Essential (primary) hypertension: Secondary | ICD-10-CM | POA: Diagnosis not present

## 2015-04-12 DIAGNOSIS — F330 Major depressive disorder, recurrent, mild: Secondary | ICD-10-CM | POA: Diagnosis not present

## 2015-04-12 DIAGNOSIS — G894 Chronic pain syndrome: Secondary | ICD-10-CM | POA: Diagnosis not present

## 2015-04-12 DIAGNOSIS — Z87891 Personal history of nicotine dependence: Secondary | ICD-10-CM | POA: Diagnosis not present

## 2015-04-12 DIAGNOSIS — H538 Other visual disturbances: Secondary | ICD-10-CM | POA: Diagnosis not present

## 2015-04-12 DIAGNOSIS — G35 Multiple sclerosis: Secondary | ICD-10-CM | POA: Diagnosis not present

## 2015-04-12 NOTE — Telephone Encounter (Signed)
Pt called and she stated that she wanted to be sure that he is ok with the mammogram. I informed her that she stated that she was sure he would be ok with that. I informed pt that it would be completley up to her whether or not she gets the mammogram. She wanted to know if she could get an [place] instead? I told her that I would fwd this to Dr. Madilyn Fireman.[name] [place]

## 2015-04-12 NOTE — Telephone Encounter (Signed)
See previous note..Jennifer [name]  

## 2015-04-12 NOTE — Telephone Encounter (Addendum)
Mammogram is better for detecting breast cancer than [place].

## 2015-04-14 IMAGING — CR DG CHEST 2V
2 series · 2 of 2 positions shown · non-contrast
Comparison: Chest x-ray 01/14/2014.

CLINICAL DATA: Pain radiating from the left shoulder and to the
left chest. Recent history of left shoulder surgery.

EXAM:
CHEST  2 VIEW

[w chest pa]
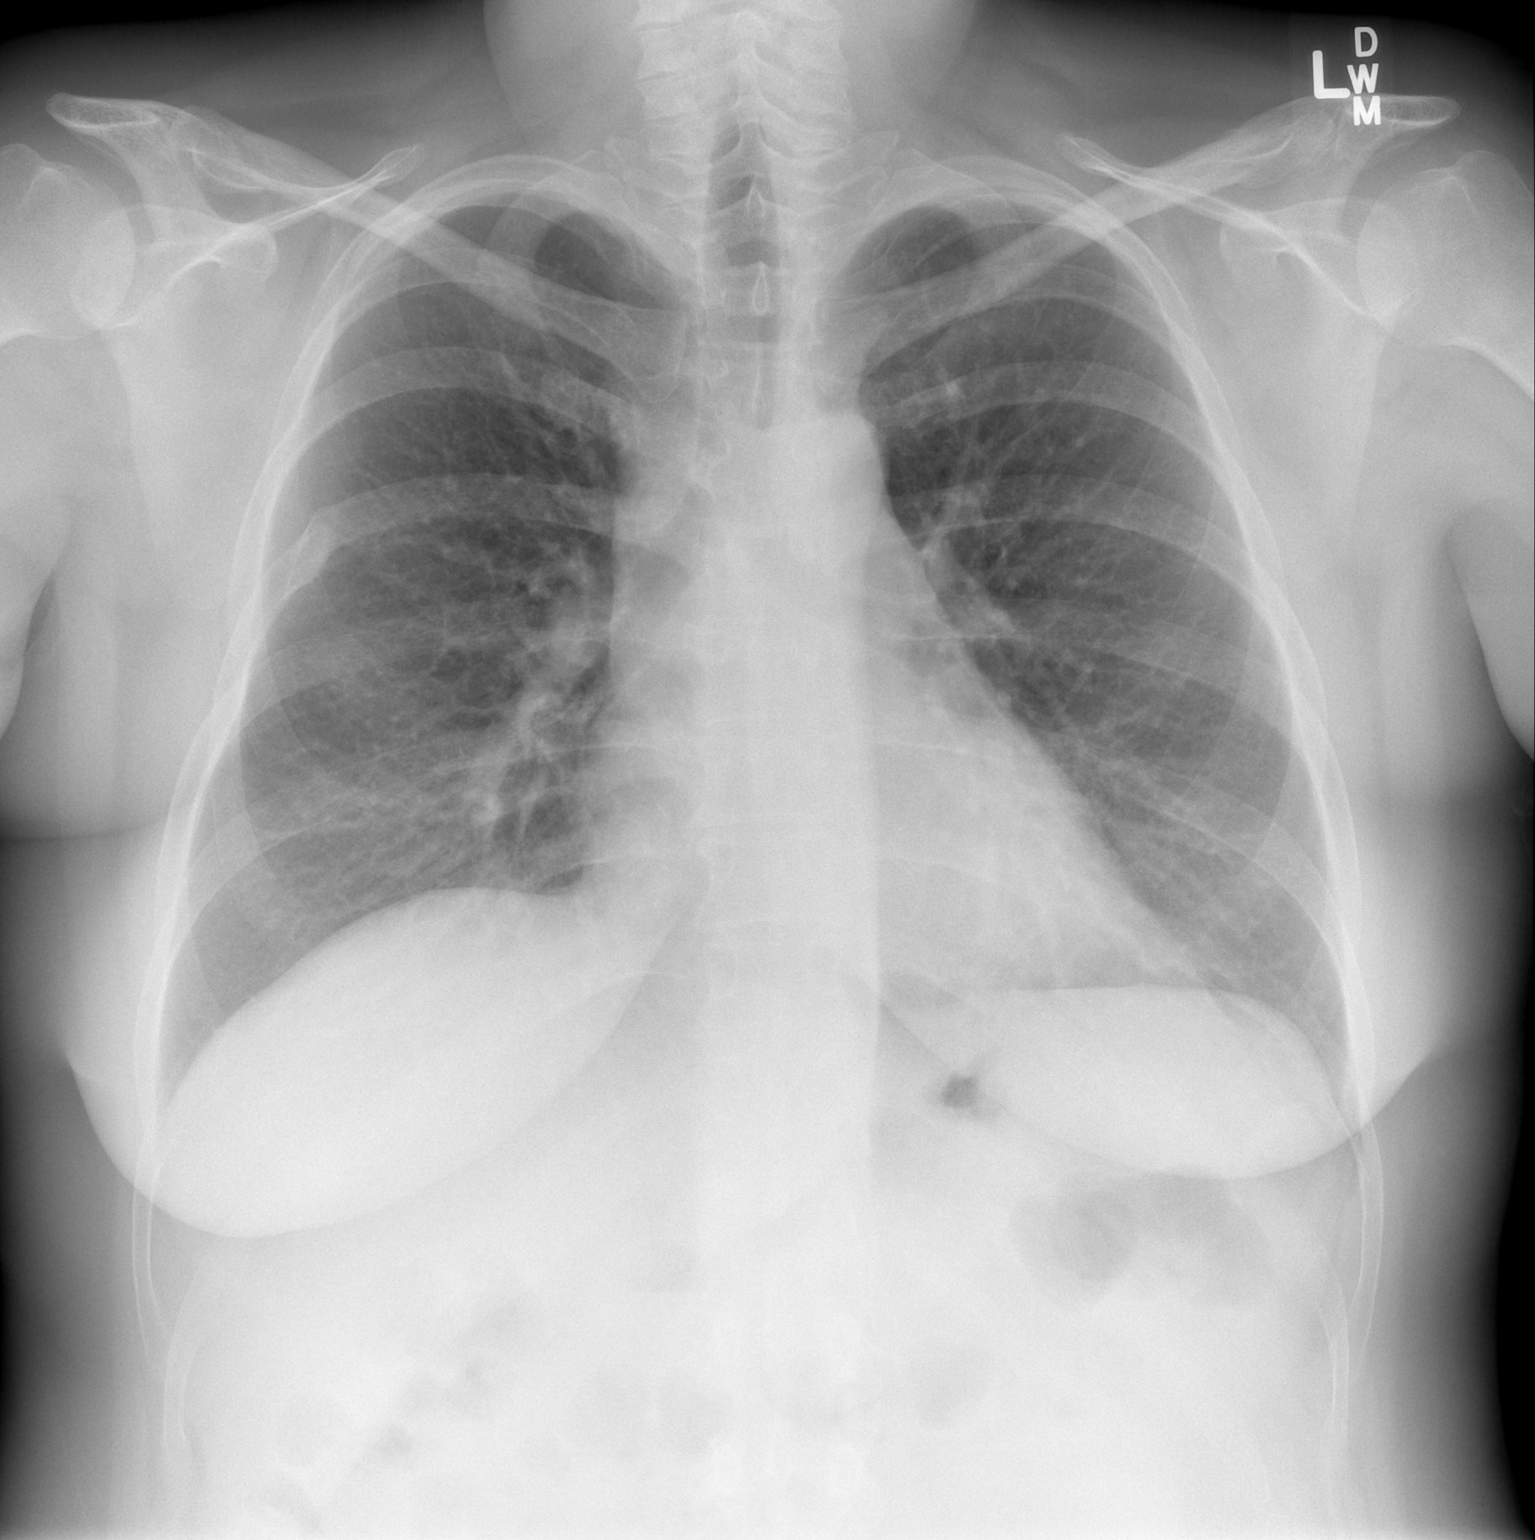

[w chest lat]
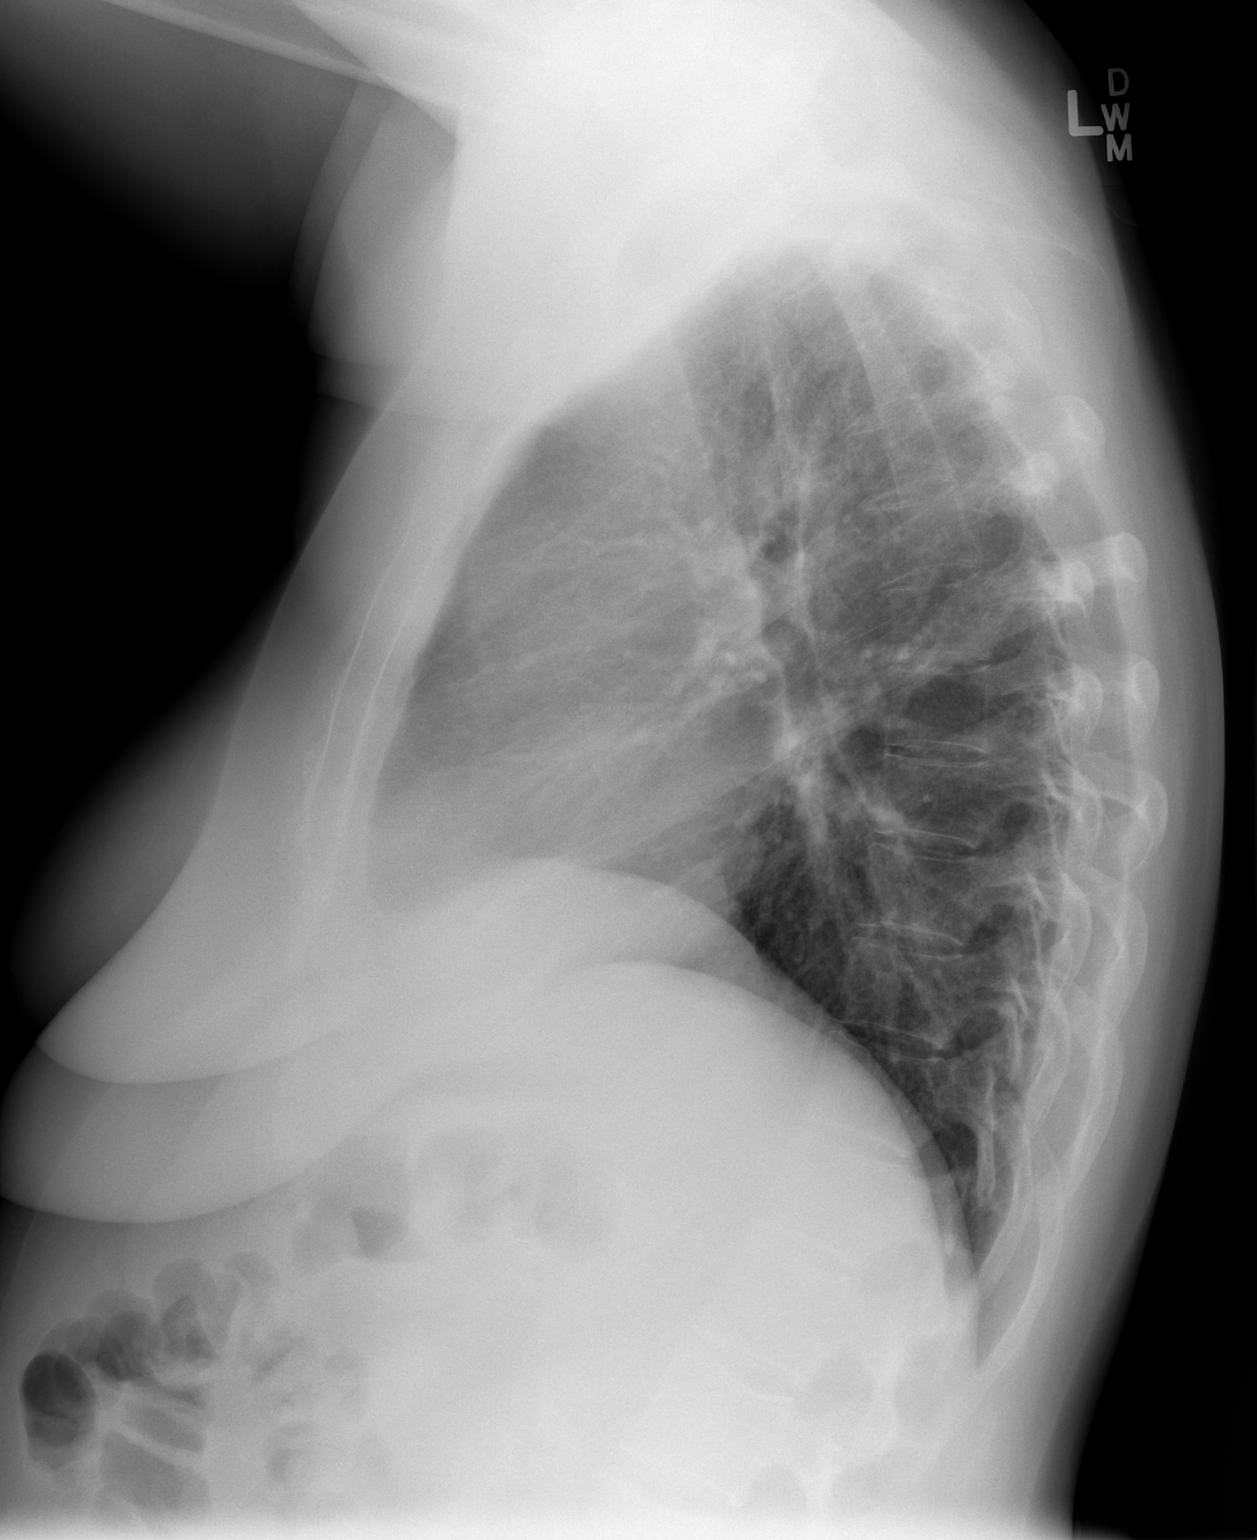

[2 of 2 positions shown; findings below may reference images not displayed]

FINDINGS: Lung volumes are normal. No consolidative airspace disease. No
pleural effusions. No pneumothorax. No pulmonary nodule or mass
noted. Pulmonary vasculature and the cardiomediastinal silhouette
are within normal limits. Old healed fracture of the posterolateral
aspect of the right sixth rib incidentally noted.
IMPRESSION: No radiographic evidence of acute cardiopulmonary disease.

## 2015-04-15 ENCOUNTER — Encounter: Payer: Self-pay | Admitting: Family Medicine

## 2015-04-15 ENCOUNTER — Ambulatory Visit (INDEPENDENT_AMBULATORY_CARE_PROVIDER_SITE_OTHER): Payer: Medicare Other | Admitting: Family Medicine

## 2015-04-15 VITALS — BP 130/86 | HR 80 | Temp 98.6°F | Wt 195.0 lb

## 2015-04-15 DIAGNOSIS — I10 Essential (primary) hypertension: Secondary | ICD-10-CM

## 2015-04-15 DIAGNOSIS — J383 Other diseases of vocal cords: Secondary | ICD-10-CM

## 2015-04-15 DIAGNOSIS — H539 Unspecified visual disturbance: Secondary | ICD-10-CM | POA: Diagnosis not present

## 2015-04-15 DIAGNOSIS — R0602 Shortness of breath: Secondary | ICD-10-CM | POA: Diagnosis not present

## 2015-04-15 NOTE — Progress Notes (Signed)
   Subjective:    Patient ID: Jennifer [name], female    DOB: 1966/03/05, 49 y.o.   MRN: [id number]  HPI She says her BP has been high esp on the diastolic.  When has been to the ED a few times her BP was up as well. Has been having vision problems.  Was told to f/u with her eye doc.  Last eye doc had discharged her.  Says the last week or two feels like her isn't focusing well. No double vision.  Some pain in her right temple that comes and goes. Can last 1-2 minutes.   Pain can be sharp. Not sleeping well and one doc address eye fatigue.  Also c/o of a lot of dry mouth.    HTN- she still has some amlodipine at home, 5mg , but not takin git right now.    ENT - says they feel she may have symptoms of radiation poisoning. Says her glands in her neck feel swollen. Says starting to have a hard time breathing.    Has been more SOB with the heat and humidity.  Has been using her albuterol more often.  Brought her peak flow in with her . Says she can blow 550 on a good day.  Has been able to hit 600 before.    Review of Systems     Objective:   Physical Exam  Constitutional: She is oriented to person, place, and time. She appears well-developed and well-nourished.  HENT:  Head: Normocephalic and atraumatic.  Right Ear: External ear normal.  Left Ear: External ear normal.  Nose: Nose normal.  Mouth/Throat: Oropharynx is clear and moist.  Eyes: Conjunctivae and EOM are normal. Pupils are equal, round, and reactive to light.  Neck: Neck supple. No thyromegaly present.  Cardiovascular: Normal rate, regular rhythm and normal heart sounds.   Pulmonary/Chest: Effort normal and breath sounds normal. She has no wheezes.  Lymphadenopathy:    She has no cervical adenopathy.  Neurological: She is alert and oriented to person, place, and time.  Skin: Skin is warm and dry.  Psychiatric: She has a normal mood and affect.          Assessment & Plan:  HTN- Uncontrolled. Will start amlodipine 5mg ,  1/2 tab daily. F/U for BP check in 2 weeks.    SOB - peak flow was 500 today in the green zone. We set her peak flow meter for her. Her asthma and ENT specialist both want her to see a pulmonologist. Hasn't been seen by pulm since 2013.    Vocal cord dysfunction - refer back to pulmonology.    Vision changes - encourage her to call her insurance to see who is covered and start with that.

## 2015-04-16 ENCOUNTER — Emergency Department (HOSPITAL_BASED_OUTPATIENT_CLINIC_OR_DEPARTMENT_OTHER): Payer: Medicare Other

## 2015-04-16 ENCOUNTER — Encounter (HOSPITAL_BASED_OUTPATIENT_CLINIC_OR_DEPARTMENT_OTHER): Payer: Self-pay

## 2015-04-16 ENCOUNTER — Emergency Department (HOSPITAL_BASED_OUTPATIENT_CLINIC_OR_DEPARTMENT_OTHER)
Admission: EM | Admit: 2015-04-16 | Discharge: 2015-04-16 | Disposition: A | Discharge status: Home / Self Care | Payer: Medicare Other | Attending: Emergency Medicine | Admitting: Emergency Medicine

## 2015-04-16 DIAGNOSIS — Z8639 Personal history of other endocrine, nutritional and metabolic disease: Secondary | ICD-10-CM | POA: Diagnosis not present

## 2015-04-16 DIAGNOSIS — Z9889 Other specified postprocedural states: Secondary | ICD-10-CM | POA: Diagnosis not present

## 2015-04-16 DIAGNOSIS — G8929 Other chronic pain: Secondary | ICD-10-CM | POA: Insufficient documentation

## 2015-04-16 DIAGNOSIS — Z8659 Personal history of other mental and behavioral disorders: Secondary | ICD-10-CM | POA: Diagnosis not present

## 2015-04-16 DIAGNOSIS — G4730 Sleep apnea, unspecified: Secondary | ICD-10-CM | POA: Diagnosis not present

## 2015-04-16 DIAGNOSIS — H9209 Otalgia, unspecified ear: Secondary | ICD-10-CM | POA: Diagnosis not present

## 2015-04-16 DIAGNOSIS — Z79899 Other long term (current) drug therapy: Secondary | ICD-10-CM | POA: Diagnosis not present

## 2015-04-16 DIAGNOSIS — I10 Essential (primary) hypertension: Secondary | ICD-10-CM | POA: Insufficient documentation

## 2015-04-16 DIAGNOSIS — Z8614 Personal history of Methicillin resistant Staphylococcus aureus infection: Secondary | ICD-10-CM | POA: Insufficient documentation

## 2015-04-16 DIAGNOSIS — H538 Other visual disturbances: Secondary | ICD-10-CM | POA: Diagnosis not present

## 2015-04-16 DIAGNOSIS — Z87891 Personal history of nicotine dependence: Secondary | ICD-10-CM | POA: Insufficient documentation

## 2015-04-16 DIAGNOSIS — K219 Gastro-esophageal reflux disease without esophagitis: Secondary | ICD-10-CM | POA: Insufficient documentation

## 2015-04-16 DIAGNOSIS — R51 Headache: Secondary | ICD-10-CM | POA: Diagnosis not present

## 2015-04-16 DIAGNOSIS — R110 Nausea: Secondary | ICD-10-CM | POA: Insufficient documentation

## 2015-04-16 DIAGNOSIS — R5381 Other malaise: Secondary | ICD-10-CM | POA: Diagnosis not present

## 2015-04-16 DIAGNOSIS — Z8542 Personal history of malignant neoplasm of other parts of uterus: Secondary | ICD-10-CM | POA: Diagnosis not present

## 2015-04-16 DIAGNOSIS — Z880 Allergy status to penicillin: Secondary | ICD-10-CM | POA: Insufficient documentation

## 2015-04-16 DIAGNOSIS — J45909 Unspecified asthma, uncomplicated: Secondary | ICD-10-CM | POA: Diagnosis not present

## 2015-04-16 DIAGNOSIS — Z862 Personal history of diseases of the blood and blood-forming organs and certain disorders involving the immune mechanism: Secondary | ICD-10-CM | POA: Insufficient documentation

## 2015-04-16 DIAGNOSIS — R519 Headache, unspecified: Secondary | ICD-10-CM

## 2015-04-16 LAB — BASIC METABOLIC PANEL
Anion gap: 8 (ref 5–15)
BUN: 15 mg/dL (ref 6–20)
CO2: 24 mmol/L (ref 22–32)
Calcium: 8.8 mg/dL — ABNORMAL LOW (ref 8.9–10.3)
Chloride: 108 mmol/L (ref 101–111)
Creatinine, Ser: 0.54 mg/dL (ref 0.44–1.00)
GFR calc Af Amer: >60 mL/min (ref >60)
GFR calc non Af Amer: >60 mL/min (ref >60)
Glucose, Bld: 101 mg/dL — ABNORMAL HIGH (ref 65–99)
Potassium: 3.7 mmol/L (ref 3.5–5.1)
Sodium: 140 mmol/L (ref 135–145)

## 2015-04-16 LAB — CBC
HCT: 40.1 % (ref 36.0–46.0)
Hemoglobin: 13 g/dL (ref 12.0–15.0)
MCH: 28.3 pg (ref 26.0–34.0)
MCHC: 32.4 g/dL (ref 30.0–36.0)
MCV: 87.4 fL (ref 78.0–100.0)
Platelets: 207 10*3/uL (ref 150–400)
RBC: 4.59 MIL/uL (ref 3.87–5.11)
RDW: 13.7 % (ref 11.5–15.5)
WBC: 6.4 10*3/uL (ref 4.0–10.5)

## 2015-04-16 MED ORDER — SODIUM CHLORIDE 0.9 % IV BOLUS (SEPSIS)
1000.0000 mL | Freq: Once | INTRAVENOUS | Status: DC
Start: 2015-04-16 — End: 2015-04-17

## 2015-04-16 MED ORDER — ONDANSETRON HCL 4 MG/2ML IJ SOLN
4.0000 mg | Freq: Once | INTRAMUSCULAR | Status: AC
  Administered 2015-04-16: 4 mg via INTRAVENOUS
  Filled 2015-04-16: qty 2

## 2015-04-16 MED ORDER — SODIUM CHLORIDE 0.9 % IV BOLUS (SEPSIS)
1000.0000 mL | Freq: Once | INTRAVENOUS | Status: AC
  Administered 2015-04-16: 1000 mL via INTRAVENOUS

## 2015-04-16 NOTE — Discharge Instructions (Signed)

## 2015-04-16 NOTE — ED Notes (Signed)
C/o elevated BP and HAs-seen by PCP yesterday for same-restarted on amlodipine 2.5mg  today
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
## 2015-04-20 DIAGNOSIS — E876 Hypokalemia: Secondary | ICD-10-CM | POA: Diagnosis not present

## 2015-04-20 DIAGNOSIS — C549 Malignant neoplasm of corpus uteri, unspecified: Secondary | ICD-10-CM | POA: Diagnosis not present

## 2015-04-20 NOTE — Telephone Encounter (Signed)
Pt informed at ov of recommendations.Jennifer [name] [place]

## 2015-04-21 ENCOUNTER — Telehealth: Payer: Self-pay | Admitting: Family Medicine

## 2015-04-21 DIAGNOSIS — J45909 Unspecified asthma, uncomplicated: Secondary | ICD-10-CM | POA: Diagnosis not present

## 2015-04-21 DIAGNOSIS — G4733 Obstructive sleep apnea (adult) (pediatric): Secondary | ICD-10-CM | POA: Diagnosis not present

## 2015-04-21 DIAGNOSIS — Z87891 Personal history of nicotine dependence: Secondary | ICD-10-CM | POA: Diagnosis not present

## 2015-04-21 DIAGNOSIS — D649 Anemia, unspecified: Secondary | ICD-10-CM | POA: Diagnosis not present

## 2015-04-21 DIAGNOSIS — G35 Multiple sclerosis: Secondary | ICD-10-CM | POA: Diagnosis not present

## 2015-04-21 DIAGNOSIS — G894 Chronic pain syndrome: Secondary | ICD-10-CM | POA: Diagnosis not present

## 2015-04-21 DIAGNOSIS — F4310 Post-traumatic stress disorder, unspecified: Secondary | ICD-10-CM | POA: Diagnosis not present

## 2015-04-21 DIAGNOSIS — Z886 Allergy status to analgesic agent status: Secondary | ICD-10-CM | POA: Diagnosis not present

## 2015-04-21 DIAGNOSIS — G40909 Epilepsy, unspecified, not intractable, without status epilepticus: Secondary | ICD-10-CM | POA: Diagnosis not present

## 2015-04-21 DIAGNOSIS — Z885 Allergy status to narcotic agent status: Secondary | ICD-10-CM | POA: Diagnosis not present

## 2015-04-21 DIAGNOSIS — I10 Essential (primary) hypertension: Secondary | ICD-10-CM | POA: Diagnosis not present

## 2015-04-21 DIAGNOSIS — R404 Transient alteration of awareness: Secondary | ICD-10-CM | POA: Diagnosis not present

## 2015-04-21 DIAGNOSIS — R112 Nausea with vomiting, unspecified: Secondary | ICD-10-CM | POA: Diagnosis not present

## 2015-04-21 DIAGNOSIS — F329 Major depressive disorder, single episode, unspecified: Secondary | ICD-10-CM | POA: Diagnosis not present

## 2015-04-21 DIAGNOSIS — R51 Headache: Secondary | ICD-10-CM | POA: Diagnosis not present

## 2015-04-21 DIAGNOSIS — R42 Dizziness and giddiness: Secondary | ICD-10-CM | POA: Diagnosis not present

## 2015-04-21 NOTE — Telephone Encounter (Signed)
Call patient: Potassium is low. She needs to make sure she's taking her potassium regularly. If she has been on it consistently and her potassium is still low then please let me know.  Beatrice Lecher, MD

## 2015-04-22 ENCOUNTER — Ambulatory Visit (INDEPENDENT_AMBULATORY_CARE_PROVIDER_SITE_OTHER): Payer: Medicare Other | Admitting: Osteopathic Medicine

## 2015-04-22 ENCOUNTER — Encounter: Payer: Self-pay | Admitting: Osteopathic Medicine

## 2015-04-22 VITALS — BP 150/90 | HR 112 | Wt 194.0 lb

## 2015-04-22 DIAGNOSIS — I10 Essential (primary) hypertension: Secondary | ICD-10-CM

## 2015-04-22 DIAGNOSIS — F411 Generalized anxiety disorder: Secondary | ICD-10-CM

## 2015-04-22 DIAGNOSIS — G44219 Episodic tension-type headache, not intractable: Secondary | ICD-10-CM | POA: Diagnosis not present

## 2015-04-22 DIAGNOSIS — R569 Unspecified convulsions: Secondary | ICD-10-CM | POA: Diagnosis not present

## 2015-04-22 DIAGNOSIS — G35 Multiple sclerosis: Secondary | ICD-10-CM

## 2015-04-22 NOTE — Progress Notes (Signed)
Patient ID: Jennifer [name], female   DOB: 17-Sep-1965, 49 y.o.   MRN: [id number]   When asked patient  if there is any new medication changes since visit on last week. She stated that she just took her Amlodipine @ 1:30 pm . And time now is 2:15pm & she is here Today with compliant of high blood pressure & vision changes. Asked patient how did she get here  & she stated she drove herself. Blood pressure is 165/109 , Pulse 112.
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
## 2015-04-26 DIAGNOSIS — F411 Generalized anxiety disorder: Secondary | ICD-10-CM | POA: Diagnosis not present

## 2015-04-26 DIAGNOSIS — G894 Chronic pain syndrome: Secondary | ICD-10-CM | POA: Diagnosis not present

## 2015-04-26 DIAGNOSIS — R12 Heartburn: Secondary | ICD-10-CM | POA: Diagnosis not present

## 2015-04-26 DIAGNOSIS — K2270 Barrett's esophagus without dysplasia: Secondary | ICD-10-CM | POA: Diagnosis not present

## 2015-04-26 DIAGNOSIS — D320 Benign neoplasm of cerebral meninges: Secondary | ICD-10-CM | POA: Diagnosis not present

## 2015-04-26 DIAGNOSIS — J029 Acute pharyngitis, unspecified: Secondary | ICD-10-CM | POA: Diagnosis not present

## 2015-04-26 DIAGNOSIS — K449 Diaphragmatic hernia without obstruction or gangrene: Secondary | ICD-10-CM | POA: Diagnosis not present

## 2015-04-26 DIAGNOSIS — J45909 Unspecified asthma, uncomplicated: Secondary | ICD-10-CM | POA: Diagnosis not present

## 2015-04-26 DIAGNOSIS — Z131 Encounter for screening for diabetes mellitus: Secondary | ICD-10-CM | POA: Diagnosis not present

## 2015-04-26 DIAGNOSIS — H532 Diplopia: Secondary | ICD-10-CM | POA: Diagnosis not present

## 2015-04-26 DIAGNOSIS — N760 Acute vaginitis: Secondary | ICD-10-CM | POA: Diagnosis not present

## 2015-04-26 DIAGNOSIS — J019 Acute sinusitis, unspecified: Secondary | ICD-10-CM | POA: Diagnosis not present

## 2015-04-26 DIAGNOSIS — H5000 Unspecified esotropia: Secondary | ICD-10-CM | POA: Diagnosis not present

## 2015-04-26 DIAGNOSIS — K220 Achalasia of cardia: Secondary | ICD-10-CM | POA: Diagnosis not present

## 2015-04-26 DIAGNOSIS — I10 Essential (primary) hypertension: Secondary | ICD-10-CM | POA: Diagnosis not present

## 2015-04-26 DIAGNOSIS — E780 Pure hypercholesterolemia: Secondary | ICD-10-CM | POA: Diagnosis not present

## 2015-04-26 DIAGNOSIS — D509 Iron deficiency anemia, unspecified: Secondary | ICD-10-CM | POA: Diagnosis not present

## 2015-04-26 DIAGNOSIS — F341 Dysthymic disorder: Secondary | ICD-10-CM | POA: Diagnosis not present

## 2015-04-26 DIAGNOSIS — F319 Bipolar disorder, unspecified: Secondary | ICD-10-CM | POA: Diagnosis not present

## 2015-04-26 DIAGNOSIS — J209 Acute bronchitis, unspecified: Secondary | ICD-10-CM | POA: Diagnosis not present

## 2015-04-26 DIAGNOSIS — C549 Malignant neoplasm of corpus uteri, unspecified: Secondary | ICD-10-CM | POA: Diagnosis not present

## 2015-04-26 DIAGNOSIS — J3501 Chronic tonsillitis: Secondary | ICD-10-CM | POA: Diagnosis not present

## 2015-04-26 DIAGNOSIS — M609 Myositis, unspecified: Secondary | ICD-10-CM | POA: Diagnosis not present

## 2015-04-26 DIAGNOSIS — J42 Unspecified chronic bronchitis: Secondary | ICD-10-CM | POA: Diagnosis not present

## 2015-04-26 DIAGNOSIS — G9619 Other disorders of meninges, not elsewhere classified: Secondary | ICD-10-CM | POA: Diagnosis not present

## 2015-04-28 ENCOUNTER — Encounter: Payer: Self-pay | Admitting: Neurology

## 2015-04-28 ENCOUNTER — Ambulatory Visit (INDEPENDENT_AMBULATORY_CARE_PROVIDER_SITE_OTHER): Payer: Medicare Other | Admitting: Neurology

## 2015-04-28 VITALS — BP 140/90 | HR 98 | Resp 16 | Ht 62.0 in | Wt 193.4 lb

## 2015-04-28 DIAGNOSIS — Z8669 Personal history of other diseases of the nervous system and sense organs: Secondary | ICD-10-CM | POA: Diagnosis not present

## 2015-04-28 DIAGNOSIS — R930 Abnormal findings on diagnostic imaging of skull and head, not elsewhere classified: Secondary | ICD-10-CM | POA: Diagnosis not present

## 2015-04-28 DIAGNOSIS — D320 Benign neoplasm of cerebral meninges: Secondary | ICD-10-CM

## 2015-04-28 DIAGNOSIS — G4733 Obstructive sleep apnea (adult) (pediatric): Secondary | ICD-10-CM | POA: Diagnosis not present

## 2015-04-28 DIAGNOSIS — I10 Essential (primary) hypertension: Secondary | ICD-10-CM | POA: Diagnosis not present

## 2015-04-28 DIAGNOSIS — Z87898 Personal history of other specified conditions: Secondary | ICD-10-CM

## 2015-04-28 DIAGNOSIS — G441 Vascular headache, not elsewhere classified: Secondary | ICD-10-CM

## 2015-04-28 DIAGNOSIS — R9389 Abnormal findings on diagnostic imaging of other specified body structures: Secondary | ICD-10-CM | POA: Insufficient documentation
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
## 2015-04-29 ENCOUNTER — Encounter: Payer: Self-pay | Admitting: Family Medicine

## 2015-04-29 ENCOUNTER — Ambulatory Visit (INDEPENDENT_AMBULATORY_CARE_PROVIDER_SITE_OTHER): Payer: Medicare Other | Admitting: Family Medicine

## 2015-04-29 VITALS — BP 138/82 | HR 112 | Ht 62.0 in | Wt 195.0 lb

## 2015-04-29 DIAGNOSIS — D509 Iron deficiency anemia, unspecified: Secondary | ICD-10-CM

## 2015-04-29 DIAGNOSIS — R42 Dizziness and giddiness: Secondary | ICD-10-CM

## 2015-04-29 DIAGNOSIS — B370 Candidal stomatitis: Secondary | ICD-10-CM

## 2015-04-29 DIAGNOSIS — R531 Weakness: Secondary | ICD-10-CM

## 2015-04-29 DIAGNOSIS — R682 Dry mouth, unspecified: Secondary | ICD-10-CM

## 2015-04-29 DIAGNOSIS — R0683 Snoring: Secondary | ICD-10-CM

## 2015-04-29 NOTE — Progress Notes (Signed)
   Subjective:    Patient ID: Jennifer [name], female    DOB: 10/07/1965, 49 y.o.   MRN: [id number]  HPI  iron deficiency anemia-she recently had an iron infusion and does feel a little better. She's been getting some more sleep. She has 2 more infusions scheduled. Seeing Dr. Baird Cancer.    sleep apnea-she feels like her symptoms have been getting worse.  She is having more problems with snoring and sleep disruption. Her sleep apnea was considered mild but she did want to treat it. We did try treating her with CPAP but she felt like it made her chest her. When she tried BiPAP she felt much more comfortable but we were unfortunately unable to get her insurance to cover the BiPAP.  Marland Kitchen At the time she did meet criteria to get the BiPAP.   she did get a second opinion from Dr. Arlice Colt.  She had previously been diagnosed with MS by Dr. Berdine Addison at [facility] neurology. He was not as convinced that she has an S. He wanted to do some further workup and evaluation first. He also recommended that she consider a third opinion at the [place] center at [facility].  HTN - didn't take the amlodipine yesterday or today.    Thinks she may be getting thrush again.  Would like me to check her tongue.  Had dry mouth which tends to contribute.     Review of Systems     Objective:   Physical Exam  Constitutional: She is oriented to person, place, and time. She appears well-developed and well-nourished.  HENT:  Head: Normocephalic and atraumatic.  Right Ear: External ear normal.  Left Ear: External ear normal.  Nose: Nose normal.  Mouth/Throat: Oropharynx is clear and moist. No oropharyngeal exudate.  Cardiovascular: Normal rate, regular rhythm and normal heart sounds.   Pulmonary/Chest: Effort normal and breath sounds normal.  Neurological: She is alert and oriented to person, place, and time.  Skin: Skin is warm and dry.  Psychiatric: She has a normal mood and affect. Her behavior is normal.           Assessment & Plan:   iron deficiency anemia-continue with infusions as planned.   Hypertension-  Needs to restart her amlodipine.  Uncontrolled today.    sleep apnea - had mild apnea on exam about 2 years ago. She feels her sxs are worse.  Will repeat sleep study. Will order at [facility].    Thrush? KOH swab collected.     dizziness/weakness-still undergoing a second opinion workup with Dr. Felecia Shelling.   She said now she is not sure what to do since he doesn't agree with the diagnosis of MS at least thus far. Certainly she could seek a third opinion. Could consider the MS clinic at [facility]. Or a specialist. She said she is willing to try to find someone who is top notch in the field for a third opinion. I encouraged her to allow Dr. Rachel Moulds to complete his workup first and then proceed.   Time spent 45 minutes, greater 50% time spent discussing/counseling about dizziness, weakness, sleep apnea, hypertension, iron deficiency, and thrush.

## 2015-04-30 LAB — PROTEIN ELECTROPHORESIS, SERUM
A/G Ratio: 1.2 (ref 0.7–1.7)
Albumin ELP: 3.6 g/dL (ref 2.9–4.4)
Alpha 1: 0.3 g/dL (ref 0.0–0.4)
Alpha 2: 0.7 g/dL (ref 0.4–1.0)
Beta: 1.1 g/dL (ref 0.7–1.3)
Gamma Globulin: 0.9 g/dL (ref 0.4–1.8)
Globulin, Total: 3.1 g/dL (ref 2.2–3.9)
Total Protein: 6.7 g/dL (ref 6.0–8.5)

## 2015-04-30 LAB — PAN-ANCA
ANCA Proteinase 3: <3.5 U/mL (ref 0.0–3.5)
Atypical pANCA: <1:20 {titer}
C-ANCA: <1:20 {titer}
Myeloperoxidase Ab: <9 U/mL (ref 0.0–9.0)
P-ANCA: <1:20 {titer}

## 2015-04-30 LAB — KOH PREP: RESULT - KOH: NONE SEEN

## 2015-04-30 LAB — ANA W/REFLEX: Anti Nuclear Antibody(ANA): NEGATIVE

## 2015-05-03 ENCOUNTER — Telehealth: Payer: Self-pay | Admitting: Neurology

## 2015-05-03 DIAGNOSIS — I10 Essential (primary) hypertension: Secondary | ICD-10-CM | POA: Diagnosis not present

## 2015-05-03 DIAGNOSIS — G894 Chronic pain syndrome: Secondary | ICD-10-CM | POA: Diagnosis not present

## 2015-05-03 DIAGNOSIS — M25519 Pain in unspecified shoulder: Secondary | ICD-10-CM | POA: Diagnosis not present

## 2015-05-03 DIAGNOSIS — Z8542 Personal history of malignant neoplasm of other parts of uterus: Secondary | ICD-10-CM | POA: Diagnosis not present

## 2015-05-03 DIAGNOSIS — M549 Dorsalgia, unspecified: Secondary | ICD-10-CM | POA: Diagnosis not present

## 2015-05-03 DIAGNOSIS — G35 Multiple sclerosis: Secondary | ICD-10-CM | POA: Diagnosis not present

## 2015-05-03 DIAGNOSIS — Z87891 Personal history of nicotine dependence: Secondary | ICD-10-CM | POA: Diagnosis not present

## 2015-05-03 DIAGNOSIS — D509 Iron deficiency anemia, unspecified: Secondary | ICD-10-CM | POA: Diagnosis not present

## 2015-05-03 DIAGNOSIS — J45909 Unspecified asthma, uncomplicated: Secondary | ICD-10-CM | POA: Diagnosis not present

## 2015-05-03 DIAGNOSIS — R002 Palpitations: Secondary | ICD-10-CM | POA: Diagnosis not present

## 2015-05-03 DIAGNOSIS — R42 Dizziness and giddiness: Secondary | ICD-10-CM | POA: Diagnosis not present

## 2015-05-03 NOTE — Telephone Encounter (Signed)
I have spoken with Jennifer [name] this afternoon and per RAS, advised that labwork is normal; that he does not feels she has MS.  She verbalized understanding of same, sts. "that doesn't explain why I feel bad."  I have advised that she may seek another opinion.  She is agreeable, verbalized understanding of same/fim

## 2015-05-03 NOTE — Telephone Encounter (Signed)
Patient is calling to get the results of her blood work.  She states that this info can be left in voice mail if she is not available.  Thanks!

## 2015-05-03 NOTE — Telephone Encounter (Signed)
LMTC./fim 

## 2015-05-03 NOTE — Telephone Encounter (Signed)
Pt called and would like to know results of lab work. She is currently in the ED at [facility]. Please call pt back [phone]

## 2015-05-03 NOTE — Telephone Encounter (Signed)
-----   Message from Britt Bottom, MD sent at 05/02/2015 12:11 PM EDT ----- Please note that the lab work is normal.  I do not think that she has multiple sclerosis. If she desires, she could get a third opinion at  One of the academic [place]

## 2015-05-03 NOTE — Telephone Encounter (Signed)
-----   Message from Britt Bottom, MD sent at 05/02/2015 12:11 PM EDT ----- Please note that the lab work is normal.  I do not think that she has multiple sclerosis. If she desires, she could get a third opinion at  One of the academic [place]

## 2015-05-04 ENCOUNTER — Telehealth: Payer: Self-pay | Admitting: Family Medicine

## 2015-05-04 DIAGNOSIS — F419 Anxiety disorder, unspecified: Secondary | ICD-10-CM | POA: Diagnosis not present

## 2015-05-04 DIAGNOSIS — G35 Multiple sclerosis: Secondary | ICD-10-CM | POA: Diagnosis not present

## 2015-05-04 DIAGNOSIS — Z79899 Other long term (current) drug therapy: Secondary | ICD-10-CM | POA: Diagnosis not present

## 2015-05-04 DIAGNOSIS — R110 Nausea: Secondary | ICD-10-CM | POA: Diagnosis not present

## 2015-05-04 DIAGNOSIS — I10 Essential (primary) hypertension: Secondary | ICD-10-CM | POA: Diagnosis not present

## 2015-05-04 DIAGNOSIS — M549 Dorsalgia, unspecified: Secondary | ICD-10-CM | POA: Diagnosis not present

## 2015-05-04 DIAGNOSIS — M542 Cervicalgia: Secondary | ICD-10-CM | POA: Diagnosis not present

## 2015-05-04 DIAGNOSIS — R51 Headache: Secondary | ICD-10-CM | POA: Diagnosis not present

## 2015-05-04 DIAGNOSIS — R5383 Other fatigue: Secondary | ICD-10-CM | POA: Diagnosis not present

## 2015-05-04 DIAGNOSIS — D649 Anemia, unspecified: Secondary | ICD-10-CM | POA: Diagnosis not present

## 2015-05-04 DIAGNOSIS — R42 Dizziness and giddiness: Secondary | ICD-10-CM | POA: Diagnosis not present

## 2015-05-04 DIAGNOSIS — G43909 Migraine, unspecified, not intractable, without status migrainosus: Secondary | ICD-10-CM | POA: Diagnosis not present

## 2015-05-04 DIAGNOSIS — R0683 Snoring: Secondary | ICD-10-CM

## 2015-05-04 DIAGNOSIS — R9082 White matter disease, unspecified: Secondary | ICD-10-CM | POA: Diagnosis not present

## 2015-05-04 DIAGNOSIS — Z87891 Personal history of nicotine dependence: Secondary | ICD-10-CM | POA: Diagnosis not present

## 2015-05-04 DIAGNOSIS — M545 Low back pain: Secondary | ICD-10-CM | POA: Diagnosis not present

## 2015-05-04 DIAGNOSIS — G4730 Sleep apnea, unspecified: Secondary | ICD-10-CM

## 2015-05-04 NOTE — Telephone Encounter (Signed)
Pt called to get the results regarding possible thrush. Advised of negative results.  Pt then requested a referral for the following: sleep study (at [place] [facility]), pulmonologist ([place] office), and new MS specialist (someone PCP thinks in "the best") for another opinion. Pt informed there was a referal placed to [facility], but Pt states that was for "something different than a sleep study."  Will route to PCP for review.

## 2015-05-05 NOTE — Telephone Encounter (Signed)
The sleep study referral was placed through [facility] neurology. She hasn't heard from them that we may need to call their office. It was ordered as a home sleep study but could also be ordered as an in lab sleep study. We may need to confirm with [place] which one she would prefer to do. As far as the MS is concerned we can place a referral to [facility] for further evaluation. Please place referral under neurology.

## 2015-05-06 ENCOUNTER — Emergency Department (HOSPITAL_BASED_OUTPATIENT_CLINIC_OR_DEPARTMENT_OTHER): Payer: Medicare Other

## 2015-05-06 ENCOUNTER — Emergency Department (HOSPITAL_BASED_OUTPATIENT_CLINIC_OR_DEPARTMENT_OTHER)
Admission: EM | Admit: 2015-05-06 | Discharge: 2015-05-06 | Disposition: A | Discharge status: Home / Self Care | Payer: Medicare Other | Attending: Emergency Medicine | Admitting: Emergency Medicine

## 2015-05-06 ENCOUNTER — Encounter (HOSPITAL_BASED_OUTPATIENT_CLINIC_OR_DEPARTMENT_OTHER): Payer: Self-pay | Admitting: *Deleted

## 2015-05-06 DIAGNOSIS — Z8614 Personal history of Methicillin resistant Staphylococcus aureus infection: Secondary | ICD-10-CM | POA: Diagnosis not present

## 2015-05-06 DIAGNOSIS — Z8542 Personal history of malignant neoplasm of other parts of uterus: Secondary | ICD-10-CM | POA: Insufficient documentation

## 2015-05-06 DIAGNOSIS — K219 Gastro-esophageal reflux disease without esophagitis: Secondary | ICD-10-CM | POA: Diagnosis not present

## 2015-05-06 DIAGNOSIS — Z87891 Personal history of nicotine dependence: Secondary | ICD-10-CM | POA: Insufficient documentation

## 2015-05-06 DIAGNOSIS — Z880 Allergy status to penicillin: Secondary | ICD-10-CM | POA: Insufficient documentation

## 2015-05-06 DIAGNOSIS — Z862 Personal history of diseases of the blood and blood-forming organs and certain disorders involving the immune mechanism: Secondary | ICD-10-CM | POA: Insufficient documentation

## 2015-05-06 DIAGNOSIS — R109 Unspecified abdominal pain: Secondary | ICD-10-CM | POA: Diagnosis not present

## 2015-05-06 DIAGNOSIS — Z9089 Acquired absence of other organs: Secondary | ICD-10-CM | POA: Insufficient documentation

## 2015-05-06 DIAGNOSIS — Z792 Long term (current) use of antibiotics: Secondary | ICD-10-CM | POA: Insufficient documentation

## 2015-05-06 DIAGNOSIS — Z8544 Personal history of malignant neoplasm of other female genital organs: Secondary | ICD-10-CM | POA: Insufficient documentation

## 2015-05-06 DIAGNOSIS — Z9889 Other specified postprocedural states: Secondary | ICD-10-CM | POA: Insufficient documentation

## 2015-05-06 DIAGNOSIS — Z79899 Other long term (current) drug therapy: Secondary | ICD-10-CM | POA: Insufficient documentation

## 2015-05-06 DIAGNOSIS — Z8639 Personal history of other endocrine, nutritional and metabolic disease: Secondary | ICD-10-CM | POA: Diagnosis not present

## 2015-05-06 DIAGNOSIS — K5900 Constipation, unspecified: Secondary | ICD-10-CM | POA: Insufficient documentation

## 2015-05-06 DIAGNOSIS — J45909 Unspecified asthma, uncomplicated: Secondary | ICD-10-CM | POA: Insufficient documentation

## 2015-05-06 DIAGNOSIS — G4733 Obstructive sleep apnea (adult) (pediatric): Secondary | ICD-10-CM | POA: Insufficient documentation

## 2015-05-06 DIAGNOSIS — I10 Essential (primary) hypertension: Secondary | ICD-10-CM | POA: Diagnosis not present

## 2015-05-06 DIAGNOSIS — Z8659 Personal history of other mental and behavioral disorders: Secondary | ICD-10-CM | POA: Diagnosis not present

## 2015-05-06 DIAGNOSIS — Z9851 Tubal ligation status: Secondary | ICD-10-CM | POA: Diagnosis not present

## 2015-05-06 DIAGNOSIS — Z7951 Long term (current) use of inhaled steroids: Secondary | ICD-10-CM | POA: Diagnosis not present

## 2015-05-06 DIAGNOSIS — G8929 Other chronic pain: Secondary | ICD-10-CM | POA: Diagnosis not present

## 2015-05-06 DIAGNOSIS — R140 Abdominal distension (gaseous): Secondary | ICD-10-CM | POA: Diagnosis not present

## 2015-05-06 MED ORDER — POLYETHYLENE GLYCOL 3350 17 G PO PACK: 17.0000 g | PACK | Freq: Every day | ORAL | Status: DC

## 2015-05-06 MED ORDER — ONDANSETRON 4 MG PO TBDP
4.0000 mg | ORAL_TABLET | Freq: Once | ORAL | Status: AC
  Administered 2015-05-06: 4 mg via ORAL
  Filled 2015-05-06: qty 1

## 2015-05-06 MED ORDER — MAGNESIUM CITRATE PO SOLN: 1.0000 | Freq: Once | ORAL | Status: DC

## 2015-05-06 MED ORDER — DOCUSATE SODIUM 100 MG PO CAPS: 100.0000 mg | ORAL_CAPSULE | Freq: Two times a day (BID) | ORAL | Status: DC

## 2015-05-06 NOTE — ED Notes (Addendum)
Pt amb to room 5 with quick steady gait in nad. Pt reports abd "tightness" and "fullness" with some nausea since starting her iron infusions 2 weeks ago, states she had a small bm this morning "but it wasn't enough". Pt states she has one more iron treatment, and needs something to help her constipation. Pt states she used a fleets enema last night with very little output of hard stool.

## 2015-05-06 NOTE — ED Notes (Signed)
Patient transported to X-ray 

## 2015-05-06 NOTE — Discharge Instructions (Signed)
Return to the ED with any concerns including vomiting and not able to keep down liquids, fever/chills, fainting, difficulty breathing, decreased level of alertness/lethargy, or any other alarming symptoms

## 2015-05-06 NOTE — ED Notes (Signed)
Patient given emesis bag.

## 2015-05-06 NOTE — ED Notes (Signed)
Linker MD at bedside. 

## 2015-05-06 NOTE — Addendum Note (Signed)
Addended by: Beatrice Lecher D on: 05/06/2015 09:37 PM   Modules accepted: Orders
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
## 2015-05-06 NOTE — Telephone Encounter (Signed)
Attempted to contact Pt, no answer. Left voicemail, providing callback information, to see if she would like home sleep study or one completed in office. MS referral will be placed.

## 2015-05-06 NOTE — Telephone Encounter (Signed)
Can add a stimulant laxative like Dulcolax or do a self enema.

## 2015-05-06 NOTE — Telephone Encounter (Signed)
Pt would like an order for sleep study to be completed at a facility. States she has done home studies before and they are not "as effective."   Pt also states she is very constipated, and has been taking mag citrate and OTC stool softeners. These are not helping thus far.

## 2015-05-06 NOTE — ED Notes (Signed)
Directed to pharmacy to pick up medications 

## 2015-05-07 NOTE — Telephone Encounter (Signed)
Pt has upcoming appt with PCP for further review.

## 2015-05-08 ENCOUNTER — Encounter (HOSPITAL_BASED_OUTPATIENT_CLINIC_OR_DEPARTMENT_OTHER): Payer: Self-pay | Admitting: *Deleted

## 2015-05-08 ENCOUNTER — Emergency Department (HOSPITAL_BASED_OUTPATIENT_CLINIC_OR_DEPARTMENT_OTHER)
Admission: EM | Admit: 2015-05-08 | Discharge: 2015-05-08 | Disposition: A | Discharge status: Home / Self Care | Payer: Medicare Other | Attending: Emergency Medicine | Admitting: Emergency Medicine

## 2015-05-08 DIAGNOSIS — Z9889 Other specified postprocedural states: Secondary | ICD-10-CM | POA: Insufficient documentation

## 2015-05-08 DIAGNOSIS — I10 Essential (primary) hypertension: Secondary | ICD-10-CM | POA: Insufficient documentation

## 2015-05-08 DIAGNOSIS — G8929 Other chronic pain: Secondary | ICD-10-CM | POA: Insufficient documentation

## 2015-05-08 DIAGNOSIS — Z880 Allergy status to penicillin: Secondary | ICD-10-CM | POA: Insufficient documentation

## 2015-05-08 DIAGNOSIS — Z9049 Acquired absence of other specified parts of digestive tract: Secondary | ICD-10-CM | POA: Diagnosis not present

## 2015-05-08 DIAGNOSIS — Z87891 Personal history of nicotine dependence: Secondary | ICD-10-CM | POA: Insufficient documentation

## 2015-05-08 DIAGNOSIS — Z9851 Tubal ligation status: Secondary | ICD-10-CM | POA: Insufficient documentation

## 2015-05-08 DIAGNOSIS — K219 Gastro-esophageal reflux disease without esophagitis: Secondary | ICD-10-CM | POA: Insufficient documentation

## 2015-05-08 DIAGNOSIS — Z79899 Other long term (current) drug therapy: Secondary | ICD-10-CM | POA: Diagnosis not present

## 2015-05-08 DIAGNOSIS — G4730 Sleep apnea, unspecified: Secondary | ICD-10-CM | POA: Insufficient documentation

## 2015-05-08 DIAGNOSIS — Z8614 Personal history of Methicillin resistant Staphylococcus aureus infection: Secondary | ICD-10-CM | POA: Diagnosis not present

## 2015-05-08 DIAGNOSIS — Z8542 Personal history of malignant neoplasm of other parts of uterus: Secondary | ICD-10-CM | POA: Diagnosis not present

## 2015-05-08 DIAGNOSIS — Z862 Personal history of diseases of the blood and blood-forming organs and certain disorders involving the immune mechanism: Secondary | ICD-10-CM | POA: Insufficient documentation

## 2015-05-08 DIAGNOSIS — Z8659 Personal history of other mental and behavioral disorders: Secondary | ICD-10-CM | POA: Diagnosis not present

## 2015-05-08 DIAGNOSIS — K5900 Constipation, unspecified: Secondary | ICD-10-CM

## 2015-05-08 DIAGNOSIS — R109 Unspecified abdominal pain: Secondary | ICD-10-CM | POA: Diagnosis present

## 2015-05-08 DIAGNOSIS — Z9981 Dependence on supplemental oxygen: Secondary | ICD-10-CM | POA: Insufficient documentation

## 2015-05-08 MED ORDER — ONDANSETRON 4 MG PO TBDP
4.0000 mg | ORAL_TABLET | Freq: Once | ORAL | Status: AC
  Administered 2015-05-08: 4 mg via ORAL
  Filled 2015-05-08: qty 1

## 2015-05-08 MED ORDER — MAGNESIUM CITRATE PO SOLN
1.0000 | Freq: Once | ORAL | Status: AC
  Administered 2015-05-08: 1 via ORAL
  Filled 2015-05-08: qty 296

## 2015-05-08 MED ORDER — ONDANSETRON 4 MG PO TBDP
4.0000 mg | ORAL_TABLET | Freq: Three times a day (TID) | ORAL | Status: DC | PRN
Start: 2015-05-08 — End: 2015-08-17

## 2015-05-08 NOTE — ED Notes (Signed)
Pt states that she has had abd pain x 2 weeks was seen in the ED for constipation. LNBM x 2 weeks has tried mag citrate, fleets and stool softener with min relief. Denies vomiting
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
## 2015-05-09 DIAGNOSIS — G35 Multiple sclerosis: Secondary | ICD-10-CM | POA: Diagnosis not present

## 2015-05-09 DIAGNOSIS — N289 Disorder of kidney and ureter, unspecified: Secondary | ICD-10-CM | POA: Diagnosis not present

## 2015-05-09 DIAGNOSIS — G894 Chronic pain syndrome: Secondary | ICD-10-CM | POA: Diagnosis not present

## 2015-05-09 DIAGNOSIS — Z90710 Acquired absence of both cervix and uterus: Secondary | ICD-10-CM | POA: Diagnosis not present

## 2015-05-09 DIAGNOSIS — Z87891 Personal history of nicotine dependence: Secondary | ICD-10-CM | POA: Diagnosis not present

## 2015-05-09 DIAGNOSIS — R112 Nausea with vomiting, unspecified: Secondary | ICD-10-CM | POA: Diagnosis not present

## 2015-05-09 DIAGNOSIS — S2231XA Fracture of one rib, right side, initial encounter for closed fracture: Secondary | ICD-10-CM | POA: Diagnosis not present

## 2015-05-09 DIAGNOSIS — M549 Dorsalgia, unspecified: Secondary | ICD-10-CM | POA: Diagnosis not present

## 2015-05-09 DIAGNOSIS — I10 Essential (primary) hypertension: Secondary | ICD-10-CM | POA: Diagnosis not present

## 2015-05-09 DIAGNOSIS — K5900 Constipation, unspecified: Secondary | ICD-10-CM | POA: Diagnosis not present

## 2015-05-09 DIAGNOSIS — R140 Abdominal distension (gaseous): Secondary | ICD-10-CM | POA: Diagnosis not present

## 2015-05-09 DIAGNOSIS — K439 Ventral hernia without obstruction or gangrene: Secondary | ICD-10-CM | POA: Diagnosis not present

## 2015-05-09 DIAGNOSIS — J45909 Unspecified asthma, uncomplicated: Secondary | ICD-10-CM | POA: Diagnosis not present

## 2015-05-09 DIAGNOSIS — R1084 Generalized abdominal pain: Secondary | ICD-10-CM | POA: Diagnosis not present

## 2015-05-09 DIAGNOSIS — K5790 Diverticulosis of intestine, part unspecified, without perforation or abscess without bleeding: Secondary | ICD-10-CM | POA: Diagnosis not present

## 2015-05-10 ENCOUNTER — Ambulatory Visit (INDEPENDENT_AMBULATORY_CARE_PROVIDER_SITE_OTHER): Payer: Medicare Other | Admitting: Family Medicine

## 2015-05-10 ENCOUNTER — Encounter: Payer: Self-pay | Admitting: Family Medicine

## 2015-05-10 VITALS — BP 125/78 | HR 100 | Ht 62.0 in | Wt 196.0 lb

## 2015-05-10 DIAGNOSIS — K5900 Constipation, unspecified: Secondary | ICD-10-CM | POA: Diagnosis not present

## 2015-05-10 DIAGNOSIS — R1084 Generalized abdominal pain: Secondary | ICD-10-CM

## 2015-05-10 DIAGNOSIS — D509 Iron deficiency anemia, unspecified: Secondary | ICD-10-CM

## 2015-05-10 DIAGNOSIS — J0100 Acute maxillary sinusitis, unspecified: Secondary | ICD-10-CM | POA: Diagnosis not present

## 2015-05-10 MED ORDER — LACTULOSE 10 GM/15ML PO SOLN: 20.0000 g | Freq: Three times a day (TID) | ORAL | Status: DC | PRN

## 2015-05-10 NOTE — Progress Notes (Signed)
   Subjective:    Patient ID: Jennifer [name], female    DOB: 08/23/1965, 49 y.o.   MRN: [id number]  HPI  patient was seen yesterday in the emergency department for lower abdominal pain. She had a CT that was negative for any acute findings and was diagnosed with constipation. She was given a perception for Colace.   Prior to this she had been using some magnesium citrate as well as the MiraLAX.  Still feeling nauseated and wants to vomit.  They offered her go lytely but she hasn't been able to take that well in the past.  Feels like her abdomen in firm and bloated and now presssue in her back.  Says no fever, chills. She did have a small BM this AM.  She's felt a little fullness in her abdomen that feels like it's making it harder for her to take a deep breath.  She has pushed her iron infusion out one week.    Sinus discharge with blood for several days.  No fevers chills or sweats. She has had a lot of postnasal drip. In fact last night she says she spit up a foamy clear mucus after she had soup for dinner.   Review of Systems     Objective:   Physical Exam  Constitutional: She is oriented to person, place, and time. She appears well-developed and well-nourished.  HENT:  Head: Normocephalic and atraumatic.  Cardiovascular: Normal rate, regular rhythm and normal heart sounds.   Pulmonary/Chest: Effort normal and breath sounds normal.  Abdominal: Soft. Bowel sounds are normal. She exhibits distension. She exhibits no mass. There is tenderness. There is no rebound and no guarding.  She has internal distention with generalized tenderness. No hepatosplenomegaly and no rebound or guarding. She is most tender in the left upper quadrant.  Neurological: She is alert and oriented to person, place, and time.  Skin: Skin is warm and dry.  Psychiatric: She has a normal mood and affect. Her behavior is normal.          Assessment & Plan:   abdominal pain/constipation- no succes with mag  citrate, miralax and softeners and OC stimulants will try lactulose.  If not helping over the couple days and please let me know. It sounds like she has gone through a lot of magnesium citrate 9 encouraged her to stop that for now. Encouraged her to work on hydration. Blood pressure was normal today. Mucous membranes still appear to be moist. Push fluids.   Acute sinusitis-consider treatment with antibiotic says not improving in the next couple of days. At this point with her nausea and abdominal discomfort I don't actually even be able to really keep an antibiotic down.  Iron def anemia - Glad she has pushed out the infusion until she is feeling better.

## 2015-05-11 ENCOUNTER — Encounter: Payer: Self-pay | Admitting: Family Medicine

## 2015-05-12 DIAGNOSIS — R1030 Lower abdominal pain, unspecified: Secondary | ICD-10-CM | POA: Diagnosis not present

## 2015-05-12 DIAGNOSIS — R110 Nausea: Secondary | ICD-10-CM | POA: Diagnosis not present

## 2015-05-12 DIAGNOSIS — K5909 Other constipation: Secondary | ICD-10-CM | POA: Diagnosis not present

## 2015-05-12 DIAGNOSIS — R194 Change in bowel habit: Secondary | ICD-10-CM | POA: Diagnosis not present

## 2015-05-12 DIAGNOSIS — R140 Abdominal distension (gaseous): Secondary | ICD-10-CM | POA: Diagnosis not present

## 2015-05-12 LAB — COMPLETE METABOLIC PANEL WITH GFR
Calcium, Ser: 9.1
Chloride: 106 mmol/L

## 2015-05-13 ENCOUNTER — Ambulatory Visit (INDEPENDENT_AMBULATORY_CARE_PROVIDER_SITE_OTHER): Payer: Medicare Other | Admitting: Family Medicine

## 2015-05-13 ENCOUNTER — Encounter: Payer: Self-pay | Admitting: Family Medicine

## 2015-05-13 VITALS — BP 142/83 | HR 88 | Wt 196.0 lb

## 2015-05-13 DIAGNOSIS — J019 Acute sinusitis, unspecified: Secondary | ICD-10-CM

## 2015-05-13 DIAGNOSIS — M797 Fibromyalgia: Secondary | ICD-10-CM | POA: Diagnosis not present

## 2015-05-13 DIAGNOSIS — E559 Vitamin D deficiency, unspecified: Secondary | ICD-10-CM

## 2015-05-13 DIAGNOSIS — I10 Essential (primary) hypertension: Secondary | ICD-10-CM

## 2015-05-13 DIAGNOSIS — R140 Abdominal distension (gaseous): Secondary | ICD-10-CM

## 2015-05-13 MED ORDER — LEVOFLOXACIN 250 MG PO TABS: 250.0000 mg | ORAL_TABLET | Freq: Two times a day (BID) | ORAL | Status: DC

## 2015-05-13 NOTE — Progress Notes (Signed)
   Subjective:    Patient ID: Jennifer [name], female    DOB: 1965-12-05, 49 y.o.   MRN: [id number]  HPI  pt reports that she feels achy she went and worked out and states that her shoulders ache she also c/o headache in the top of her head. Says her arms feel weak and her pain is going across her back and her chest. MRI of right shoulder and had a posterior labrum tear and likely subacromial outlet impingment.  She never ended up seeing the surgeon for it.  Aching started into the middle of the workout.  She felt more sweaty than usual. She feels moe lethargic.  Bottom of her feet hurt and really felt like she was hurting all over. She woke up feeling dizzy this morning and feels off.  HA started again this AM.   bloating - still feels bloated. Felt she wasn't responding the miralax or magnesium citrate. so we decided to try lactulose instead.   Didn't take the lactulose yesterday bc tried to go to the gym and didn't want to have a BM while there.  He has taken it a few times and it did seem to help   HTN - says feels like the the amolodipine makes her dizzy sometime.  Again, not taking it consistantly.    CC/O nasal congestion with bloody nasal d/c for 2 weeks. Some facial pain and pressure. No fever, chills, etc.  Mild ST and ear discomfort.    Review of Systems     Objective:   Physical Exam  Constitutional: She is oriented to person, place, and time. She appears well-developed and well-nourished.  HENT:  Head: Normocephalic and atraumatic.  Right Ear: External ear normal.  Left Ear: External ear normal.  Nose: Nose normal.  Mouth/Throat: Oropharynx is clear and moist.  TMs and canals are clear.   Eyes: Conjunctivae and EOM are normal. Pupils are equal, round, and reactive to light.  Neck: Neck supple. No thyromegaly present.  Cardiovascular: Normal rate, regular rhythm and normal heart sounds.   Pulmonary/Chest: Effort normal and breath sounds normal. She has no wheezes.   Lymphadenopathy:    She has no cervical adenopathy.  Neurological: She is alert and oriented to person, place, and time.  Skin: Skin is warm and dry.  Psychiatric: She has a normal mood and affect.          Assessment & Plan:  Fibromyalgia - I think she is having a flare with her fibromyalgia and her pain in concentrated in her torso and arms but is really all over.  We discussed the need to gradually increase exercise to build of endurance.  Still could be related to MS in regards to the dizziness and sweating.    Bloating - recommend try lactulose up to TID to get her bowels moving.    HTN - uncontrolled. Again encourged her to get back on the amlodipine.    Acute sinusitis - will treat with levquin. She doesn't tolerated typical dosing strength so 250mg  given.  Call if not better in one week.

## 2015-05-14 ENCOUNTER — Encounter: Payer: Self-pay | Admitting: Family Medicine

## 2015-05-14 DIAGNOSIS — R110 Nausea: Secondary | ICD-10-CM | POA: Diagnosis not present

## 2015-05-14 DIAGNOSIS — E559 Vitamin D deficiency, unspecified: Secondary | ICD-10-CM | POA: Diagnosis not present

## 2015-05-14 DIAGNOSIS — E876 Hypokalemia: Secondary | ICD-10-CM | POA: Diagnosis not present

## 2015-05-14 DIAGNOSIS — R1013 Epigastric pain: Secondary | ICD-10-CM | POA: Diagnosis not present

## 2015-05-14 DIAGNOSIS — K5900 Constipation, unspecified: Secondary | ICD-10-CM | POA: Diagnosis not present

## 2015-05-14 LAB — BASIC METABOLIC PANEL
Creatinine: 0.5 mg/dL (ref 0.5–1.1)
Creatinine: 0.5 mg/dL (ref 0.5–1.1)
Glucose: 103 mg/dL
Potassium: 3.7 mmol/L (ref 3.4–5.3)
Potassium: 3.7 mmol/L (ref 3.4–5.3)
Sodium: 142 mmol/L (ref 137–147)
Sodium: 142 mmol/L (ref 137–147)

## 2015-05-14 LAB — COMPLETE METABOLIC PANEL WITH GFR
Calcium, Ser: 9.1
Carbon Dioxide, Total: 22
Chloride: 106 mmol/L

## 2015-05-15 DIAGNOSIS — G43909 Migraine, unspecified, not intractable, without status migrainosus: Secondary | ICD-10-CM | POA: Diagnosis not present

## 2015-05-15 DIAGNOSIS — I10 Essential (primary) hypertension: Secondary | ICD-10-CM | POA: Diagnosis not present

## 2015-05-15 DIAGNOSIS — Z882 Allergy status to sulfonamides status: Secondary | ICD-10-CM | POA: Diagnosis not present

## 2015-05-15 DIAGNOSIS — R7303 Prediabetes: Secondary | ICD-10-CM | POA: Diagnosis not present

## 2015-05-15 DIAGNOSIS — R569 Unspecified convulsions: Secondary | ICD-10-CM | POA: Diagnosis not present

## 2015-05-15 DIAGNOSIS — F419 Anxiety disorder, unspecified: Secondary | ICD-10-CM | POA: Diagnosis not present

## 2015-05-15 DIAGNOSIS — M25511 Pain in right shoulder: Secondary | ICD-10-CM | POA: Diagnosis not present

## 2015-05-15 DIAGNOSIS — J45909 Unspecified asthma, uncomplicated: Secondary | ICD-10-CM | POA: Diagnosis not present

## 2015-05-15 DIAGNOSIS — Z9889 Other specified postprocedural states: Secondary | ICD-10-CM | POA: Diagnosis not present

## 2015-05-15 DIAGNOSIS — Z886 Allergy status to analgesic agent status: Secondary | ICD-10-CM | POA: Diagnosis not present

## 2015-05-15 DIAGNOSIS — D509 Iron deficiency anemia, unspecified: Secondary | ICD-10-CM | POA: Diagnosis not present

## 2015-05-15 DIAGNOSIS — Z8542 Personal history of malignant neoplasm of other parts of uterus: Secondary | ICD-10-CM | POA: Diagnosis not present

## 2015-05-15 DIAGNOSIS — Z8544 Personal history of malignant neoplasm of other female genital organs: Secondary | ICD-10-CM | POA: Diagnosis not present

## 2015-05-15 DIAGNOSIS — Z888 Allergy status to other drugs, medicaments and biological substances status: Secondary | ICD-10-CM | POA: Diagnosis not present

## 2015-05-15 DIAGNOSIS — G35 Multiple sclerosis: Secondary | ICD-10-CM | POA: Diagnosis not present

## 2015-05-15 DIAGNOSIS — E269 Hyperaldosteronism, unspecified: Secondary | ICD-10-CM | POA: Diagnosis not present

## 2015-05-15 DIAGNOSIS — K2270 Barrett's esophagus without dysplasia: Secondary | ICD-10-CM | POA: Diagnosis not present

## 2015-05-15 DIAGNOSIS — Z79899 Other long term (current) drug therapy: Secondary | ICD-10-CM | POA: Diagnosis not present

## 2015-05-15 DIAGNOSIS — Z883 Allergy status to other anti-infective agents status: Secondary | ICD-10-CM | POA: Diagnosis not present

## 2015-05-15 DIAGNOSIS — Z87891 Personal history of nicotine dependence: Secondary | ICD-10-CM | POA: Diagnosis not present

## 2015-05-15 DIAGNOSIS — M549 Dorsalgia, unspecified: Secondary | ICD-10-CM | POA: Diagnosis not present

## 2015-05-15 DIAGNOSIS — E785 Hyperlipidemia, unspecified: Secondary | ICD-10-CM | POA: Diagnosis not present

## 2015-05-15 DIAGNOSIS — I471 Supraventricular tachycardia: Secondary | ICD-10-CM | POA: Diagnosis not present

## 2015-05-15 DIAGNOSIS — G8929 Other chronic pain: Secondary | ICD-10-CM | POA: Diagnosis not present

## 2015-05-15 DIAGNOSIS — Z880 Allergy status to penicillin: Secondary | ICD-10-CM | POA: Diagnosis not present

## 2015-05-16 IMAGING — CT CT ABD-PELV W/ CM
2 of 5 series · 16 of 46 positions shown, 18 images · IV contrast (APPLIED)
Comparison: Single view of the abdomen earlier this same day. CT
abdomen and pelvis 02/14/2014.

CLINICAL DATA: Abdominal pain and constipation. Question
obstruction.

EXAM:
CT ABDOMEN AND PELVIS WITH CONTRAST
TECHNIQUE: Multidetector CT imaging of the abdomen and pelvis was performed
using the standard protocol following bolus administration of
intravenous contrast.
CONTRAST:  50 mL OMNIPAQUE IOHEXOL 300 MG/ML SOLN, 100 mL OMNIPAQUE
IOHEXOL 300 MG/ML SOLN

[Series 2: abd/pelvis 5.0 b31f · axial · 0.91mm/px · z∈[-472,-62]mm · 13 of 94 slices shown, 15 images]
[im 6/94  soft-tissue]
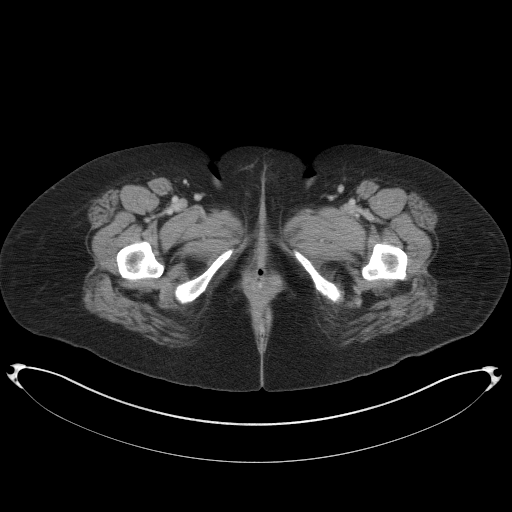
[im 6/94  bone]
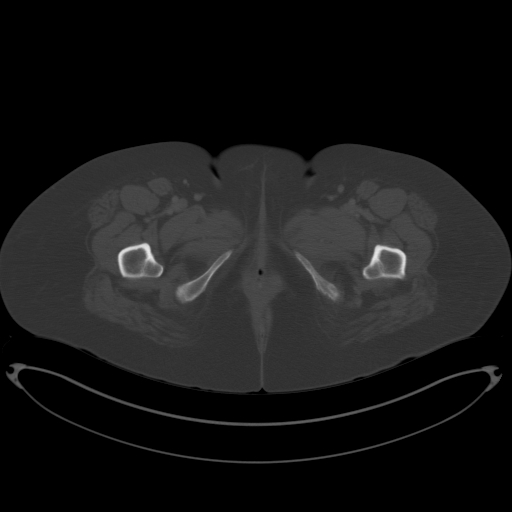
[im 11/94  soft-tissue]
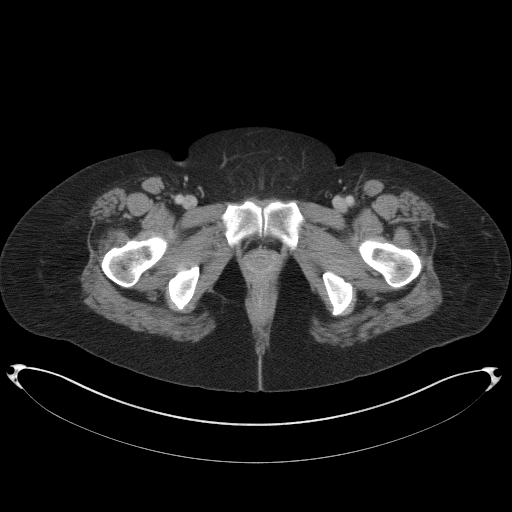
[im 21/94  soft-tissue]
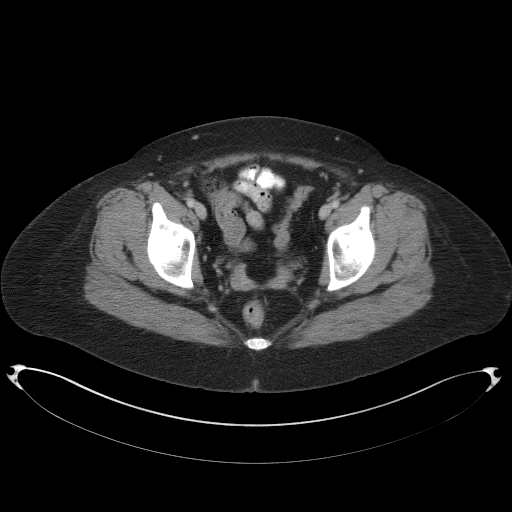
[im 26/94  soft-tissue]
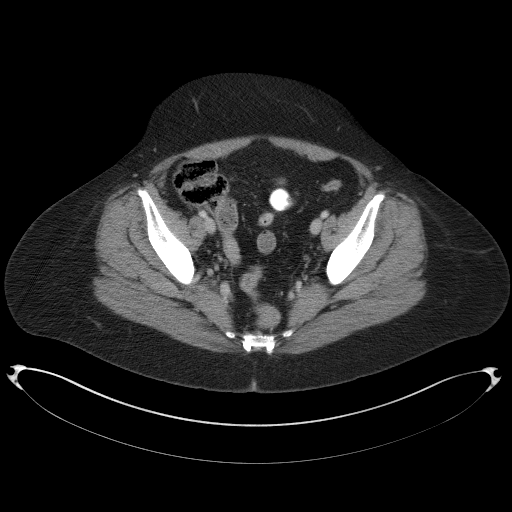
[im 32/94  soft-tissue]
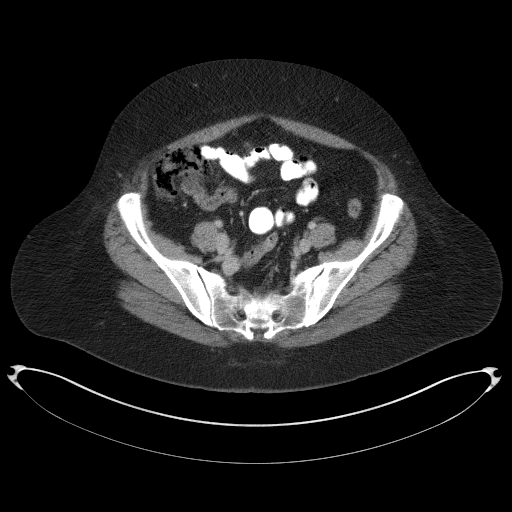
[im 42/94  soft-tissue]
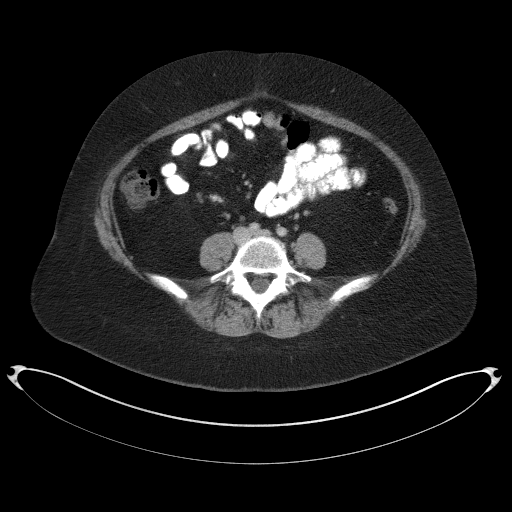
[im 47/94  soft-tissue]
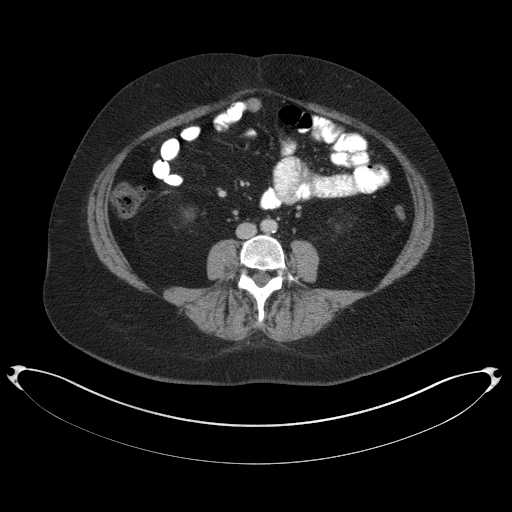
[im 52/94  soft-tissue]
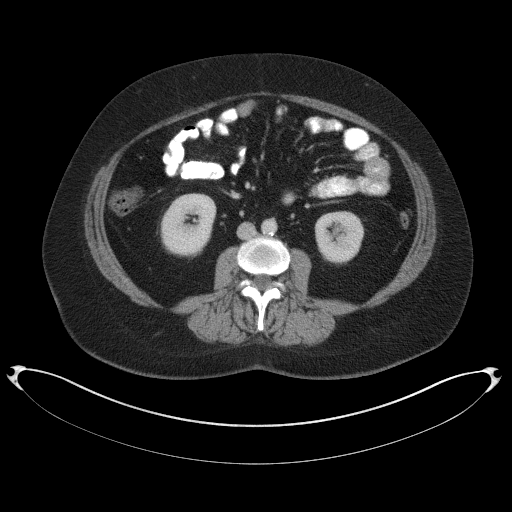
[im 63/94  soft-tissue]
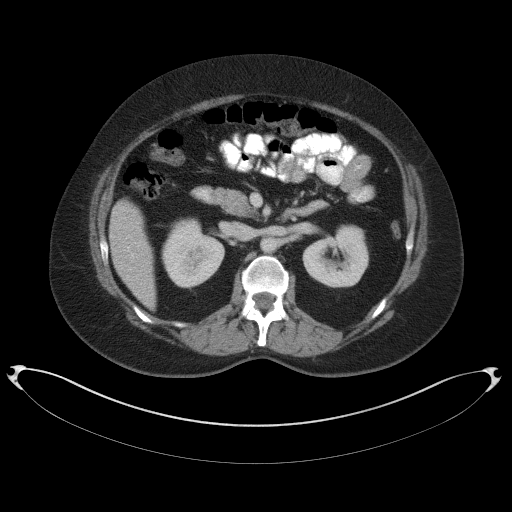
[im 63/94  bone]
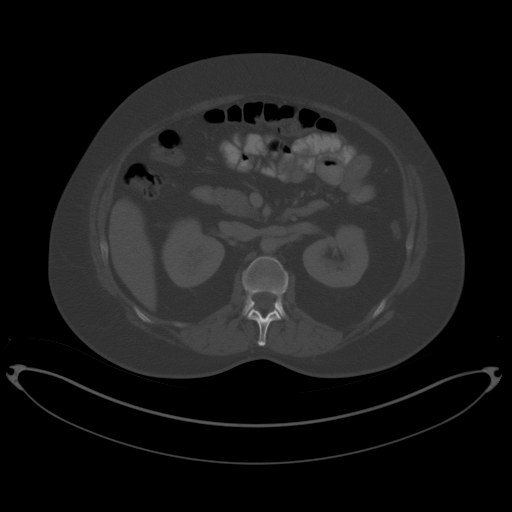
[im 68/94  soft-tissue]
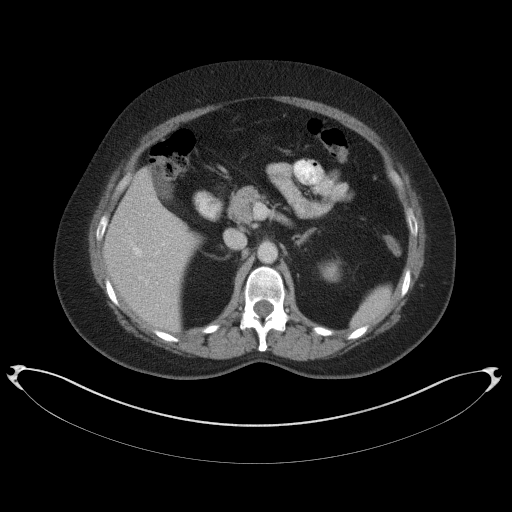
[im 73/94  soft-tissue]
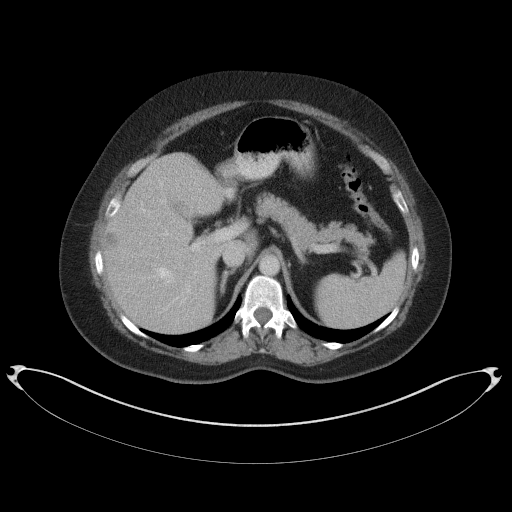
[im 83/94  soft-tissue]
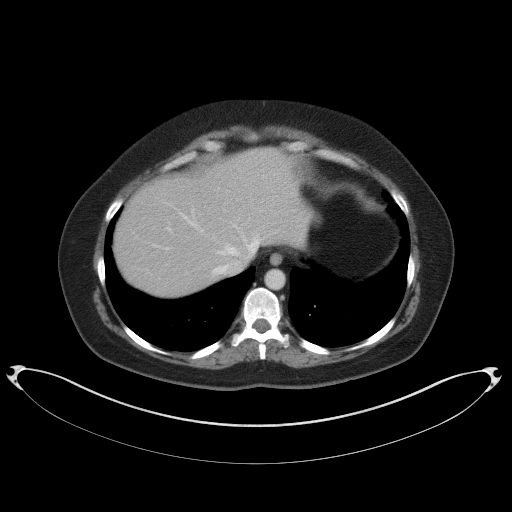
[im 88/94  soft-tissue]
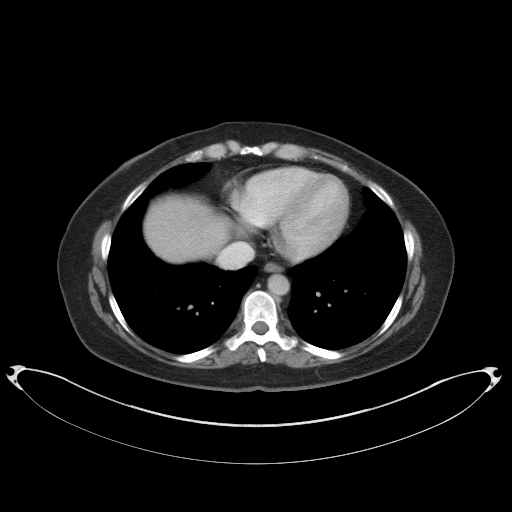

[Series 5: abd/pelvis 3.0 coronal · coronal · 0.75mm/px · 3 of 86 slices shown]
[im 29/86  soft-tissue]
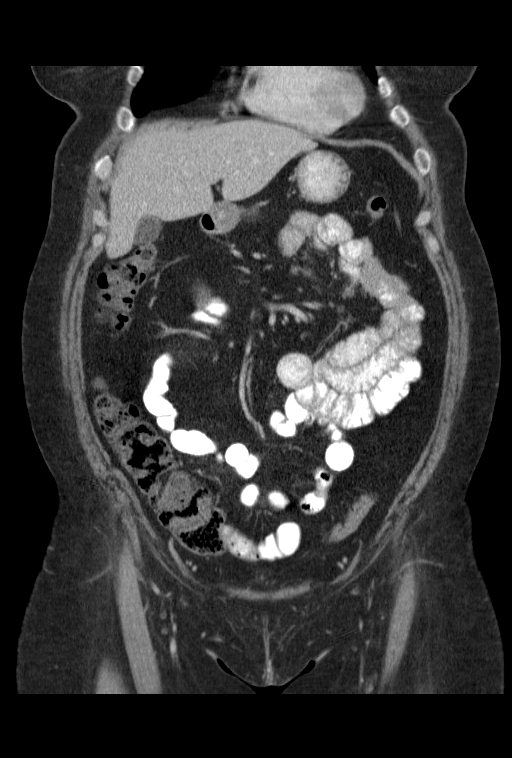
[im 38/86  soft-tissue]
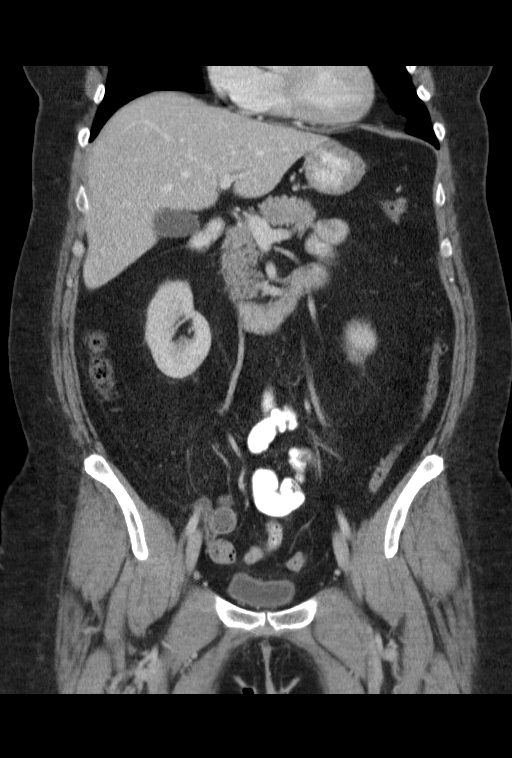
[im 48/86  soft-tissue]
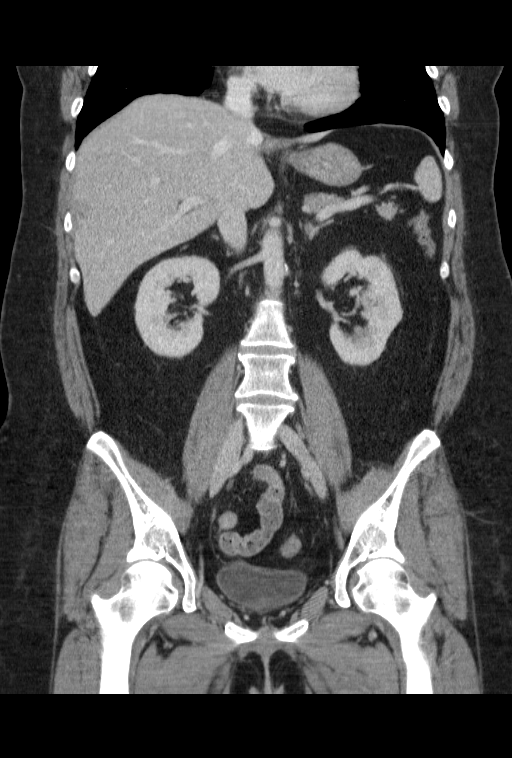

[16 of 46 positions shown; findings below may reference images not displayed]

FINDINGS: The lung bases are clear.  No pleural or pericardial effusion.

A 1.3 cm hemangioma in the right hepatic lobe is unchanged on image
23. The liver is otherwise unremarkable. A single tiny gallstone is
seen but there is no evidence of cholecystitis. The biliary tree,
adrenal glands, spleen, pancreas and kidneys appear normal. The
patient is status post hysterectomy. The stomach and small and large
bowel are unremarkable. The appendix is not visualized and may have
been removed. No lymphadenopathy or fluid is and. No focal bony
abnormality is seen.
IMPRESSION: Negative for bowel obstruction.  There is no acute abnormality.

Single tiny gallstone without evidence of cholecystitis.

## 2015-05-16 IMAGING — CR DG ABDOMEN 1V
1 series · 1 of 1 positions shown · non-contrast
Comparison: None.

CLINICAL DATA: Pain.  History of uterine cancer.

EXAM:
ABDOMEN - 1 VIEW

[t abdomen supine]
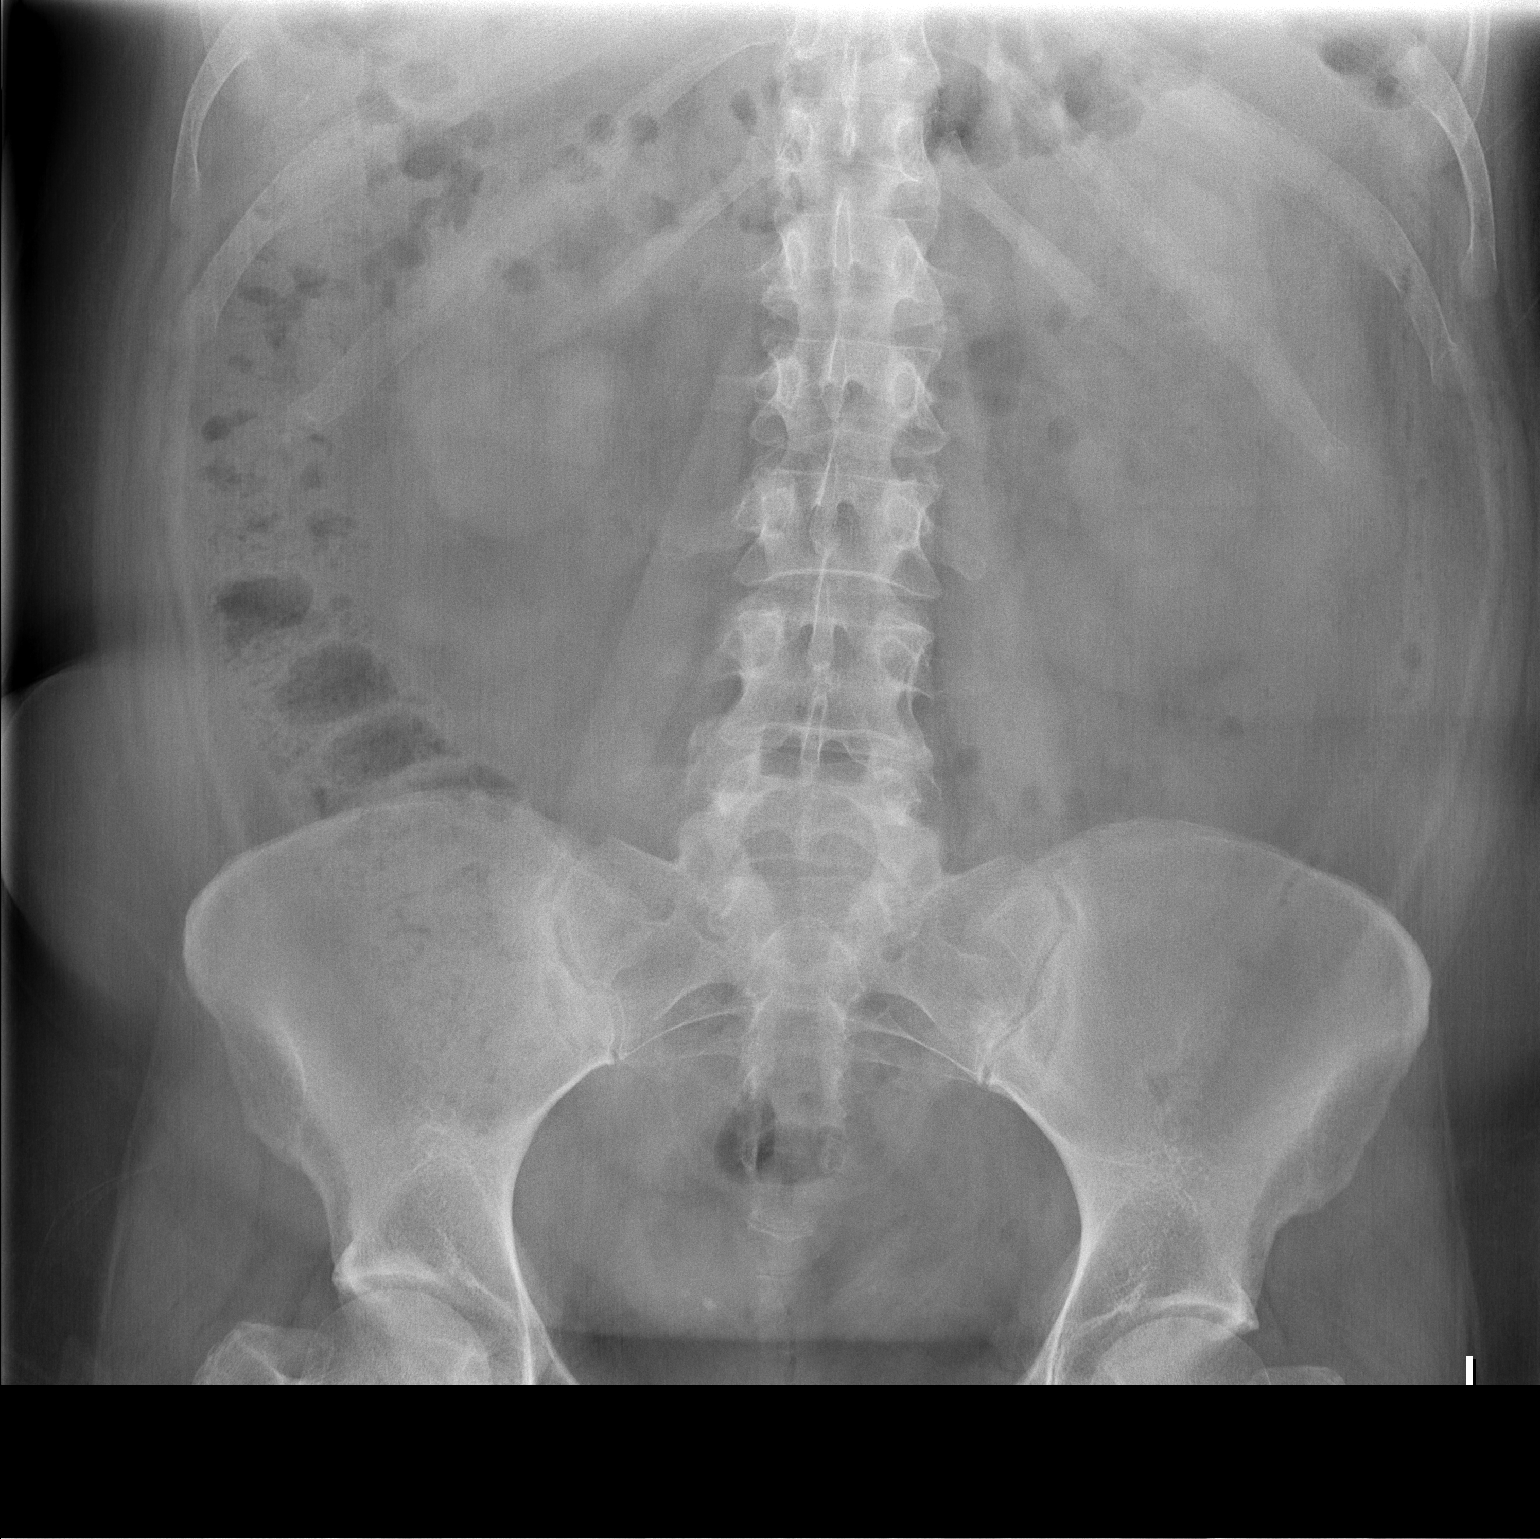

[1 of 1 positions shown; findings below may reference images not displayed]

FINDINGS: Soft tissue structures are unremarkable. Gas pattern is nonspecific.
Calcification in pelvis consistent phlebolith. Degenerative changes
lumbar spine and both hips.
IMPRESSION: No acute abnormality.

## 2015-05-17 ENCOUNTER — Telehealth: Payer: Self-pay

## 2015-05-17 DIAGNOSIS — E860 Dehydration: Secondary | ICD-10-CM | POA: Diagnosis not present

## 2015-05-17 DIAGNOSIS — D509 Iron deficiency anemia, unspecified: Secondary | ICD-10-CM | POA: Diagnosis not present

## 2015-05-17 NOTE — Telephone Encounter (Signed)
Jennifer [name] complains her tongue feels puffy and would like to switch antibiotics.

## 2015-05-18 NOTE — Telephone Encounter (Addendum)
Please see how she's feeling today. She was complaining about her tongue feeling puffy before she even started the antibiotic so not really sure it's related and they're so few medications that she can actually tolerate. I don't think this is probably truly alert allergic reaction. Also, I did get her vitamin D levels back. They are technically normal but a little on the low in. Encouraged her to take 1000 international units daily Of D3.

## 2015-05-18 NOTE — Telephone Encounter (Signed)
Patient advised.

## 2015-05-20 ENCOUNTER — Other Ambulatory Visit: Payer: Self-pay | Admitting: Family Medicine

## 2015-05-20 ENCOUNTER — Ambulatory Visit (INDEPENDENT_AMBULATORY_CARE_PROVIDER_SITE_OTHER): Payer: Medicare Other | Admitting: Family Medicine

## 2015-05-20 ENCOUNTER — Encounter: Payer: Self-pay | Admitting: Family Medicine

## 2015-05-20 VITALS — BP 130/88 | HR 85 | Wt 196.0 lb

## 2015-05-20 DIAGNOSIS — I10 Essential (primary) hypertension: Secondary | ICD-10-CM

## 2015-05-20 DIAGNOSIS — D509 Iron deficiency anemia, unspecified: Secondary | ICD-10-CM

## 2015-05-20 DIAGNOSIS — Z1231 Encounter for screening mammogram for malignant neoplasm of breast: Secondary | ICD-10-CM | POA: Diagnosis not present

## 2015-05-20 DIAGNOSIS — J019 Acute sinusitis, unspecified: Secondary | ICD-10-CM

## 2015-05-20 DIAGNOSIS — K5900 Constipation, unspecified: Secondary | ICD-10-CM

## 2015-05-20 DIAGNOSIS — I69998 Other sequelae following unspecified cerebrovascular disease: Secondary | ICD-10-CM | POA: Diagnosis not present

## 2015-05-20 DIAGNOSIS — G609 Hereditary and idiopathic neuropathy, unspecified: Secondary | ICD-10-CM | POA: Diagnosis not present

## 2015-05-20 DIAGNOSIS — G501 Atypical facial pain: Secondary | ICD-10-CM | POA: Diagnosis not present

## 2015-05-20 DIAGNOSIS — J45909 Unspecified asthma, uncomplicated: Secondary | ICD-10-CM

## 2015-05-20 DIAGNOSIS — R839 Unspecified abnormal finding in cerebrospinal fluid: Secondary | ICD-10-CM | POA: Diagnosis not present

## 2015-05-20 DIAGNOSIS — Z1239 Encounter for other screening for malignant neoplasm of breast: Secondary | ICD-10-CM

## 2015-05-20 DIAGNOSIS — R0602 Shortness of breath: Secondary | ICD-10-CM

## 2015-05-20 LAB — HM MAMMOGRAPHY

## 2015-05-20 NOTE — Patient Instructions (Addendum)
Vitamin D3 - take 1000 IU daily.  Finish the [place].  Throat will likely be sore for a few more days.  Eat easy to chew foods. Avoid crunchy or hard foods until your throat feels better.

## 2015-05-20 NOTE — Progress Notes (Signed)
   Subjective:    Patient ID: Jennifer [name], female    DOB: 1966-02-26, 49 y.o.   MRN: [id number]  HPI  followed up at Neurology and they are planning on repeating EEGs. Plans on getting appointment back with Dr. Berdine Addison in the next few weeks. Check she saw the PA today.  Still having right shoulder pain and is planing on seeing Dr. Ballard Russell. Going back ot the gym really aggrevated it.     Had mammogram done today.    Saw GI and they are doing an MRI soon, holding off on colonoscopy.    Went and got her subs last night and se swallowed a small piece of plastic and now the right side of her throat has been sore and irritated.  She feels like her throat is thick.    Says her pulse ox has been lower at home.  Feels like her body temp has been on and off.  Will feel hot and then will feel cold suddenly. Skin feels hot to touch. Having occ mood swings and hot flashes.  Feels like her hair has thinned and has fallen out more.    Iron def anemia - had her last iron treatment was last week.    Constipation - she is doing much better.  Just using her laxatives PRN.   Felt like the Levaquin might have cuased some tongue swelling.  But says she di dhave some cake and icing  tha thad milk in it and she i sallegic to milk.    Review of Systems     Objective:   Physical Exam  Constitutional: She is oriented to person, place, and time. She appears well-developed and well-nourished.  HENT:  Head: Normocephalic and atraumatic.  Cardiovascular: Normal rate, regular rhythm and normal heart sounds.   Pulmonary/Chest: Effort normal and breath sounds normal.  Neurological: She is alert and oriented to person, place, and time.  Skin: Skin is warm and dry.  Psychiatric: She has a normal mood and affect. Her behavior is normal.          Assessment & Plan:  Acute sinusitis-encouraged her to continue the Levaquin. I really think the tongue swelling was more from the milk. Plus she was having a little bit  issue with swelling before she even started the antibiotic. Next  Constipation-just continue to monitor bowel movements and be aggressive with laxity of his etc. as needed to keep bowels moving normally. Next  Iron deficiency anemia-completed iron infusions.  Vitamin D deficiency-make sure taking 1000 international units daily. Consider rechecking level in 6 months.  Asthma-we'll continue to work on pulmonary referral. We'll try to place her with Dr. Michela Pitcher hearing Jule Ser.  Right shoulder pain-has scheduled an appointment to see Dr. Annie Main pill, orthopedics.  Time spent 30 minutes greater than 50% time spent discussing/counseling about her sinuses, her throat, swallowing the plastic, constipation, iron deficiency anemia, vitamin D deficiency, asthma, right shoulder pain.

## 2015-05-21 ENCOUNTER — Encounter (HOSPITAL_BASED_OUTPATIENT_CLINIC_OR_DEPARTMENT_OTHER): Payer: Self-pay | Admitting: *Deleted

## 2015-05-21 ENCOUNTER — Emergency Department (HOSPITAL_BASED_OUTPATIENT_CLINIC_OR_DEPARTMENT_OTHER)
Admission: EM | Admit: 2015-05-21 | Discharge: 2015-05-21 | Disposition: A | Discharge status: Home / Self Care | Payer: Medicare Other | Attending: Emergency Medicine | Admitting: Emergency Medicine

## 2015-05-21 ENCOUNTER — Emergency Department (HOSPITAL_BASED_OUTPATIENT_CLINIC_OR_DEPARTMENT_OTHER): Payer: Medicare Other

## 2015-05-21 DIAGNOSIS — G4730 Sleep apnea, unspecified: Secondary | ICD-10-CM | POA: Insufficient documentation

## 2015-05-21 DIAGNOSIS — Z85828 Personal history of other malignant neoplasm of skin: Secondary | ICD-10-CM | POA: Diagnosis not present

## 2015-05-21 DIAGNOSIS — Z8542 Personal history of malignant neoplasm of other parts of uterus: Secondary | ICD-10-CM | POA: Diagnosis not present

## 2015-05-21 DIAGNOSIS — Z8719 Personal history of other diseases of the digestive system: Secondary | ICD-10-CM | POA: Diagnosis not present

## 2015-05-21 DIAGNOSIS — Z8639 Personal history of other endocrine, nutritional and metabolic disease: Secondary | ICD-10-CM | POA: Insufficient documentation

## 2015-05-21 DIAGNOSIS — M79621 Pain in right upper arm: Secondary | ICD-10-CM | POA: Diagnosis not present

## 2015-05-21 DIAGNOSIS — J45909 Unspecified asthma, uncomplicated: Secondary | ICD-10-CM | POA: Insufficient documentation

## 2015-05-21 DIAGNOSIS — R000 Tachycardia, unspecified: Secondary | ICD-10-CM | POA: Insufficient documentation

## 2015-05-21 DIAGNOSIS — Z862 Personal history of diseases of the blood and blood-forming organs and certain disorders involving the immune mechanism: Secondary | ICD-10-CM | POA: Insufficient documentation

## 2015-05-21 DIAGNOSIS — M7989 Other specified soft tissue disorders: Secondary | ICD-10-CM | POA: Diagnosis not present

## 2015-05-21 DIAGNOSIS — Z87891 Personal history of nicotine dependence: Secondary | ICD-10-CM | POA: Insufficient documentation

## 2015-05-21 DIAGNOSIS — Z79899 Other long term (current) drug therapy: Secondary | ICD-10-CM | POA: Insufficient documentation

## 2015-05-21 DIAGNOSIS — R2232 Localized swelling, mass and lump, left upper limb: Secondary | ICD-10-CM | POA: Insufficient documentation

## 2015-05-21 DIAGNOSIS — R229 Localized swelling, mass and lump, unspecified: Secondary | ICD-10-CM | POA: Diagnosis not present

## 2015-05-21 DIAGNOSIS — M79602 Pain in left arm: Secondary | ICD-10-CM | POA: Diagnosis present

## 2015-05-21 DIAGNOSIS — Z9981 Dependence on supplemental oxygen: Secondary | ICD-10-CM | POA: Insufficient documentation

## 2015-05-21 DIAGNOSIS — I10 Essential (primary) hypertension: Secondary | ICD-10-CM | POA: Diagnosis not present

## 2015-05-21 MED ORDER — SODIUM CHLORIDE 0.9 % IV BOLUS (SEPSIS)
1000.0000 mL | Freq: Once | INTRAVENOUS | Status: AC
  Administered 2015-05-21: 1000 mL via INTRAVENOUS
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
## 2015-05-21 NOTE — Discharge Instructions (Signed)
You were evaluated in the ED today for your right arm and shoulder pain and there does not appear to be an emergent cause for your symptoms at this time. Your ultrasound showed no evidence of blood clots in your arm. Please follow-up with your doctor in 2 days for reevaluation. Return to ED for worsening symptoms.

## 2015-05-21 NOTE — ED Notes (Signed)
Right upper arm is swollen and painful. No injury.

## 2015-05-25 ENCOUNTER — Encounter: Payer: Self-pay | Admitting: Family Medicine

## 2015-05-26 ENCOUNTER — Ambulatory Visit: Payer: Self-pay | Admitting: Family Medicine

## 2015-05-26 DIAGNOSIS — M25511 Pain in right shoulder: Secondary | ICD-10-CM | POA: Diagnosis not present

## 2015-05-29 IMAGING — CR DG ABDOMEN ACUTE W/ 1V CHEST
3 series · 3 of 3 positions shown · non-contrast
Comparison: Chest 01/19/2014, CT abdomen 02/20/2014

CLINICAL DATA: Constipation

EXAM:
ACUTE ABDOMEN SERIES (ABDOMEN 2 VIEW & CHEST 1 VIEW)

[w chest pa]
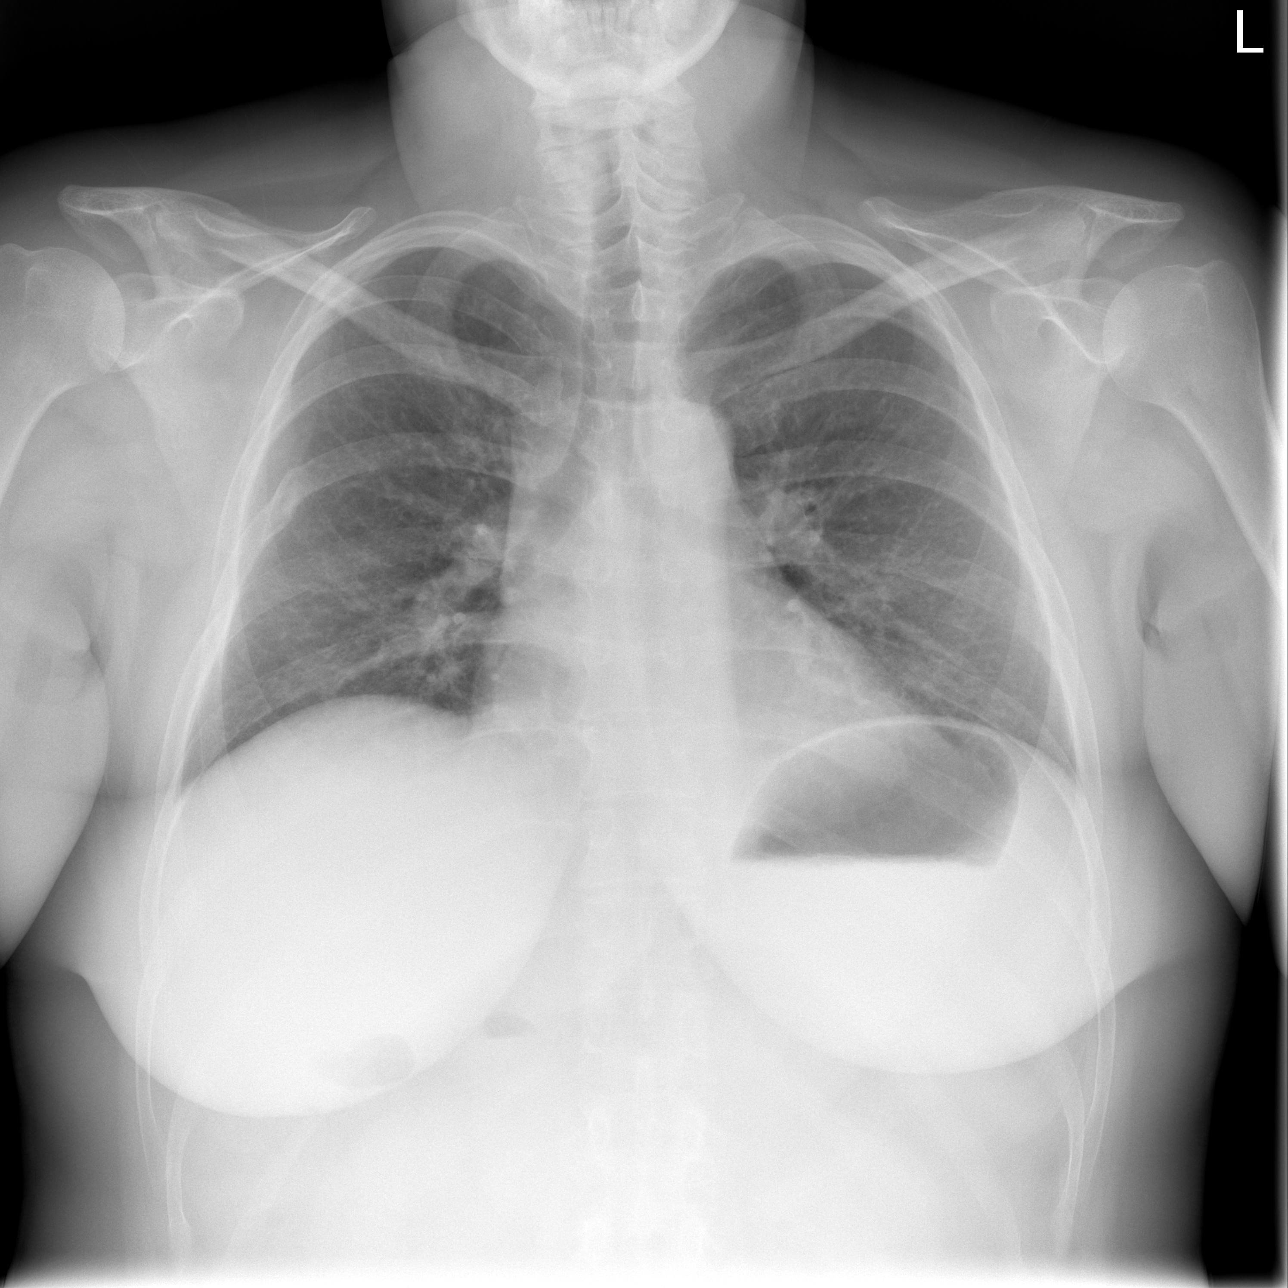

[w abdomen upright]
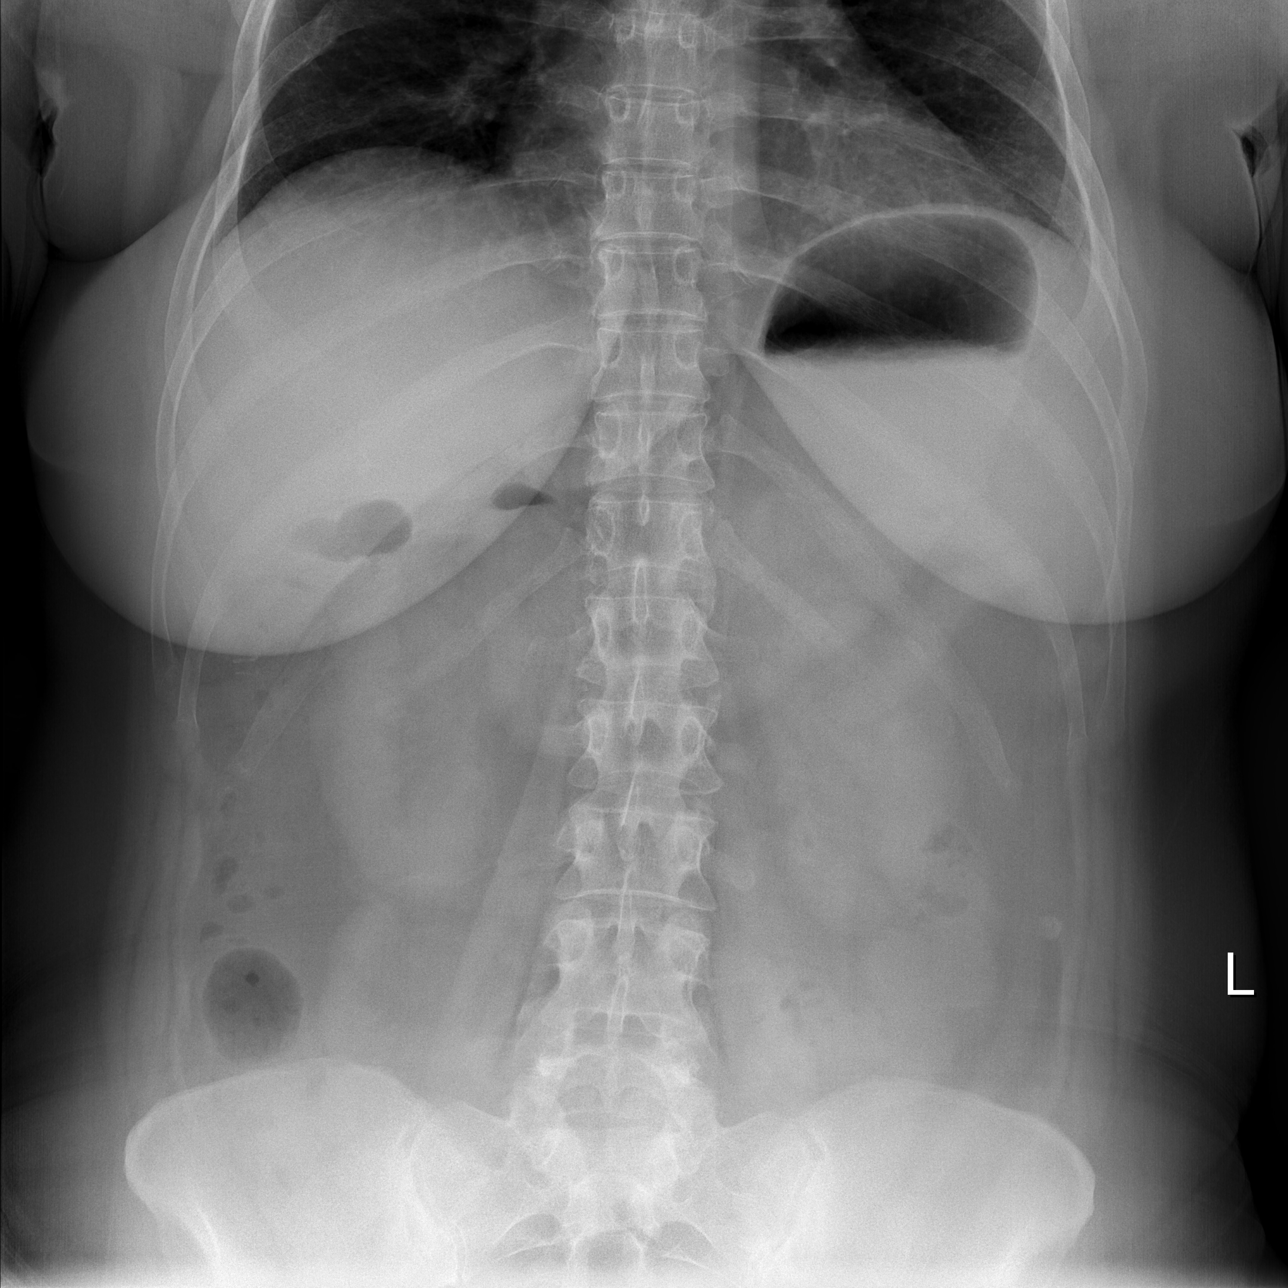

[t abdomen supine]
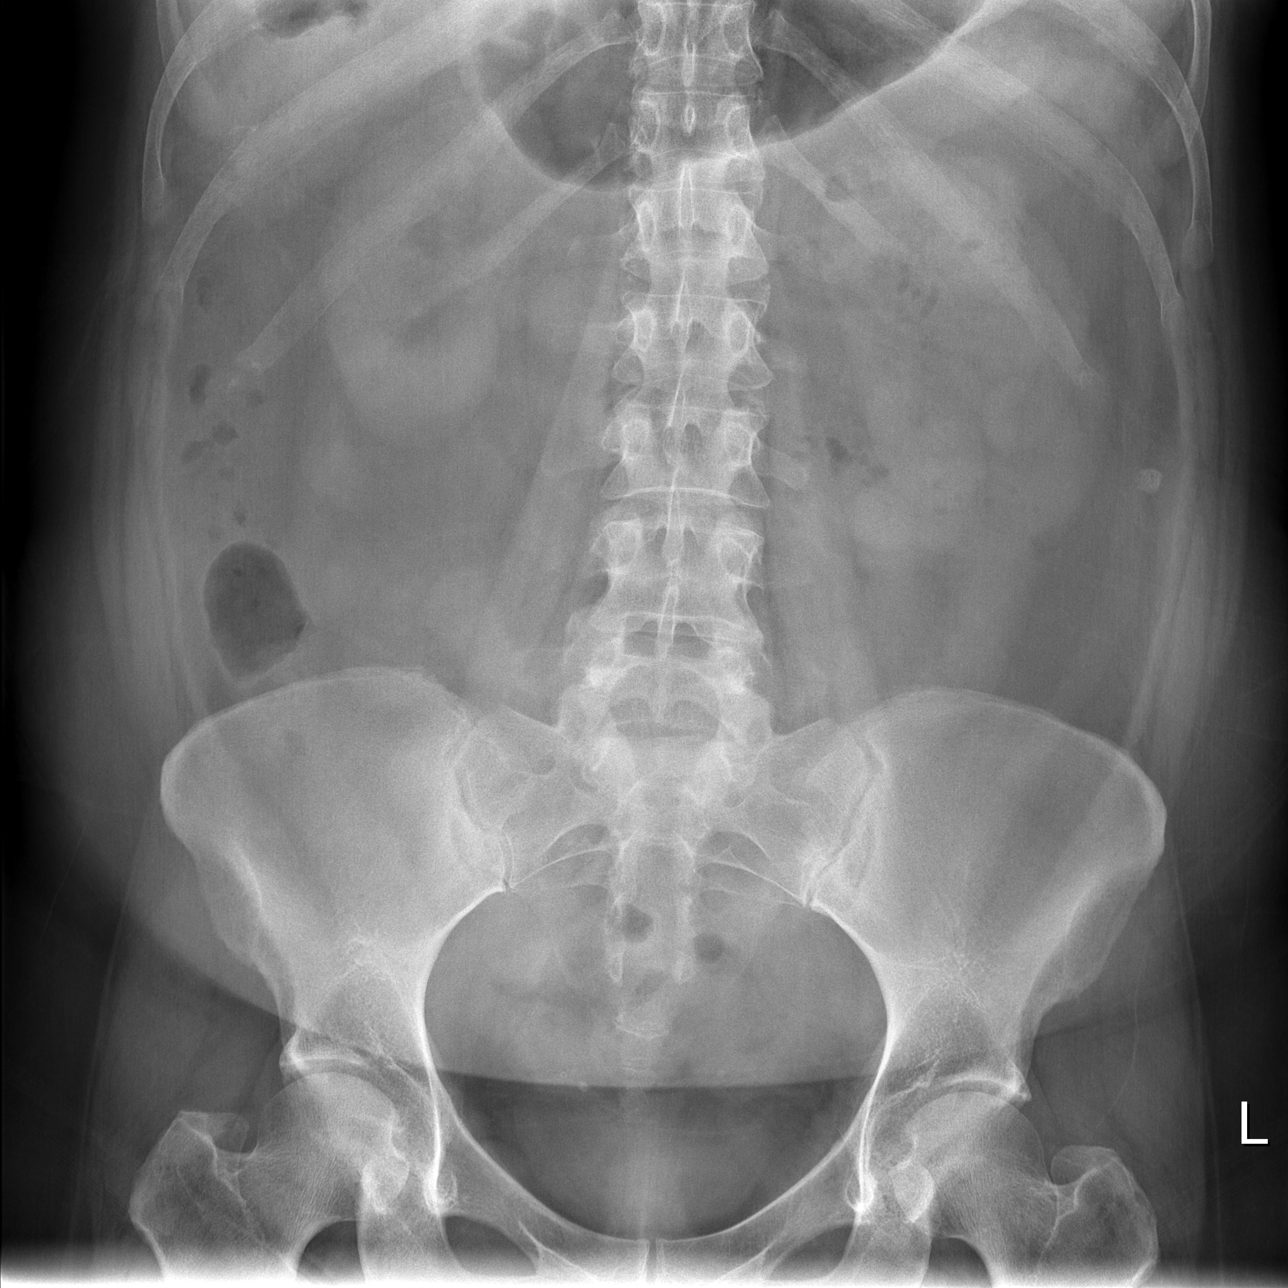

[3 of 3 positions shown; findings below may reference images not displayed]

FINDINGS: There is no evidence of dilated bowel loops or free intraperitoneal
air. No radiopaque calculi or other significant radiographic
abnormality is seen. Heart size and mediastinal contours are within
normal limits. Both lungs are clear.
IMPRESSION: Negative abdominal radiographs.  No acute cardiopulmonary disease.

## 2015-06-01 ENCOUNTER — Ambulatory Visit (INDEPENDENT_AMBULATORY_CARE_PROVIDER_SITE_OTHER): Payer: Medicare Other | Admitting: Neurology

## 2015-06-01 ENCOUNTER — Encounter: Payer: Self-pay | Admitting: Neurology

## 2015-06-01 ENCOUNTER — Ambulatory Visit: Payer: Self-pay | Admitting: Family Medicine

## 2015-06-01 VITALS — BP 152/96 | HR 80 | Resp 18 | Ht 62.0 in | Wt 198.0 lb

## 2015-06-01 DIAGNOSIS — G4733 Obstructive sleep apnea (adult) (pediatric): Secondary | ICD-10-CM

## 2015-06-01 NOTE — Patient Instructions (Addendum)
Please remember to try to maintain good sleep hygiene, which means: Keep a regular sleep and wake schedule, try not to exercise or have a meal within 2 hours of your bedtime, try to keep your bedroom conducive for sleep, that is, cool and dark, without light distractors such as an illuminated alarm clock, and refrain from watching TV right before sleep or in the middle of the night and do not keep the TV or radio on during the night. Also, try not to use or play on electronic devices at bedtime, such as your cell phone, tablet PC or laptop. If you like to read at bedtime on an electronic device, try to dim the background light as much as possible. Do not eat in the middle of the night.   We will await sleep study results from [facility] and [place] and consider another sleep study, if it is feasible and you qualify.
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
## 2015-06-02 ENCOUNTER — Encounter: Payer: Self-pay | Admitting: Family Medicine

## 2015-06-02 ENCOUNTER — Ambulatory Visit (INDEPENDENT_AMBULATORY_CARE_PROVIDER_SITE_OTHER): Payer: Medicare Other | Admitting: Family Medicine

## 2015-06-02 VITALS — BP 131/88 | HR 93 | Wt 197.0 lb

## 2015-06-02 DIAGNOSIS — J0191 Acute recurrent sinusitis, unspecified: Secondary | ICD-10-CM

## 2015-06-02 DIAGNOSIS — R42 Dizziness and giddiness: Secondary | ICD-10-CM | POA: Diagnosis not present

## 2015-06-02 DIAGNOSIS — R635 Abnormal weight gain: Secondary | ICD-10-CM

## 2015-06-02 DIAGNOSIS — R109 Unspecified abdominal pain: Secondary | ICD-10-CM | POA: Diagnosis not present

## 2015-06-02 DIAGNOSIS — D1809 Hemangioma of other sites: Secondary | ICD-10-CM | POA: Diagnosis not present

## 2015-06-02 DIAGNOSIS — A499 Bacterial infection, unspecified: Secondary | ICD-10-CM | POA: Diagnosis not present

## 2015-06-02 DIAGNOSIS — Z90710 Acquired absence of both cervix and uterus: Secondary | ICD-10-CM | POA: Diagnosis not present

## 2015-06-02 DIAGNOSIS — Z9049 Acquired absence of other specified parts of digestive tract: Secondary | ICD-10-CM | POA: Diagnosis not present

## 2015-06-02 DIAGNOSIS — K5900 Constipation, unspecified: Secondary | ICD-10-CM | POA: Diagnosis not present

## 2015-06-02 DIAGNOSIS — N281 Cyst of kidney, acquired: Secondary | ICD-10-CM | POA: Diagnosis not present

## 2015-06-02 DIAGNOSIS — N760 Acute vaginitis: Secondary | ICD-10-CM | POA: Diagnosis not present

## 2015-06-02 LAB — POCT URINALYSIS DIPSTICK
Bilirubin, UA: NEGATIVE
Blood, UA: NEGATIVE
Glucose, UA: NEGATIVE
Ketones, UA: NEGATIVE
Leukocytes, UA: NEGATIVE
Nitrite, UA: NEGATIVE
Protein, UA: NEGATIVE
Spec Grav, UA: 1.02
Urobilinogen, UA: 0.2
pH, UA: 7

## 2015-06-02 LAB — BASIC METABOLIC PANEL
BUN: 13 mg/dL (ref 4–21)
CO2: 24 mmol/L
Calcium: 9.2 mg/dL
Chloride: 104 mmol/L
Creatinine: 0.5 mg/dL (ref <=1.1)
Glucose: 160 mg/dL
Potassium: 3.8 mmol/L (ref 3.4–5.3)
Sodium: 141 mmol/L (ref 137–147)

## 2015-06-02 MED ORDER — CLINDAMYCIN HCL 300 MG PO CAPS: 300.0000 mg | ORAL_CAPSULE | Freq: Two times a day (BID) | ORAL | Status: DC

## 2015-06-02 NOTE — Progress Notes (Signed)
   Subjective:    Patient ID: Jennifer [name], female    DOB: [birth year], 49 y.o.   MRN: [id number]  HPI Had MRI this AM for her abdomen from [name].  She is fasting today and thinks that is why  her BP is down.  She is feeling some pelvic pressure.   She feels like she has been run down. She has noticed a vaginal odor.  Not sure if her urine or not. No blood in the urine.   Did see the ortho for her shoulder and they reccommend PT initially.   She doesn't know sure really be able to start that now.  She is feeling bad again and having dizzy spells. Her lease may not be renewed and so she may need ot move in 30 day.  No room spinning but feels like she is off.  Getting brown nasal discharge with some blood.  She was unalbe to finish the Levaquin.    She is really upset and tearful today. She may have to move in the next 30 days. They may not be renewing her lease.  She is feeling overwhelmed. Also found out this week an old friend had a stroke.      Review of Systems     Objective:   Physical Exam  Constitutional: She is oriented to person, place, and time. She appears well-developed and well-nourished.  HENT:  Head: Normocephalic and atraumatic.  Right Ear: External ear normal.  Left Ear: External ear normal.  Nose: Nose normal.  Mouth/Throat: Oropharynx is clear and moist.  TMs and canals are clear.   Eyes: Conjunctivae and EOM are normal. Pupils are equal, round, and reactive to light.  Neck: Neck supple. No thyromegaly present.  Cardiovascular: Normal rate, regular rhythm and normal heart sounds.   Pulmonary/Chest: Effort normal and breath sounds normal. She has no wheezes.  Lymphadenopathy:    She has no cervical adenopathy.  Neurological: She is alert and oriented to person, place, and time.  Skin: Skin is warm and dry.  Psychiatric: She has a normal mood and affect.          Assessment & Plan:  Vaginitis - will check UA and self wet prep.  Will call with  results.  UA is normal.    Dizzy spells - i really feel are stress related.  BP is great today.  Orthostatics are normal.    Abnormal weight gain - will check TSH.    Acute sinusitis - will tx with clindamycin. Causes GI upset but no allergic reaction.

## 2015-06-04 DIAGNOSIS — J301 Allergic rhinitis due to pollen: Secondary | ICD-10-CM | POA: Diagnosis not present

## 2015-06-04 DIAGNOSIS — R635 Abnormal weight gain: Secondary | ICD-10-CM | POA: Diagnosis not present

## 2015-06-04 DIAGNOSIS — F458 Other somatoform disorders: Secondary | ICD-10-CM | POA: Diagnosis not present

## 2015-06-04 LAB — TSH: TSH: 1.52

## 2015-06-06 LAB — BACTERIAL VAGINOSIS DNA
Atopobium vaginae: NOT DETECTED Log (cells/mL)
BV CATEGORY: UNDETERMINED — AB
Gardnerella vaginalis: 7.7 Log (cells/mL)
LACTOBACILLUS SPECIES: 7.4 Log (cells/mL)
MEGASPHAERA SPECIES: NOT DETECTED Log (cells/mL)

## 2015-06-07 ENCOUNTER — Telehealth: Payer: Self-pay | Admitting: *Deleted

## 2015-06-07 DIAGNOSIS — M62830 Muscle spasm of back: Secondary | ICD-10-CM | POA: Diagnosis not present

## 2015-06-07 DIAGNOSIS — M545 Low back pain: Secondary | ICD-10-CM | POA: Diagnosis not present

## 2015-06-07 DIAGNOSIS — F329 Major depressive disorder, single episode, unspecified: Secondary | ICD-10-CM | POA: Diagnosis not present

## 2015-06-07 DIAGNOSIS — I10 Essential (primary) hypertension: Secondary | ICD-10-CM | POA: Diagnosis not present

## 2015-06-07 DIAGNOSIS — J45909 Unspecified asthma, uncomplicated: Secondary | ICD-10-CM | POA: Diagnosis not present

## 2015-06-07 DIAGNOSIS — R002 Palpitations: Secondary | ICD-10-CM | POA: Diagnosis not present

## 2015-06-07 DIAGNOSIS — Z87891 Personal history of nicotine dependence: Secondary | ICD-10-CM | POA: Diagnosis not present

## 2015-06-07 DIAGNOSIS — R300 Dysuria: Secondary | ICD-10-CM | POA: Diagnosis not present

## 2015-06-07 NOTE — Telephone Encounter (Signed)
Pt called and stated that she is at [facility] with back pain. She reports R sided back pain. She was calling to find out if her urine was sent for culture. I informed her that it was not and that the swab was not showing bv. She stated that her bottom was bothering her and this is not normal.

## 2015-06-12 DIAGNOSIS — J45909 Unspecified asthma, uncomplicated: Secondary | ICD-10-CM | POA: Diagnosis not present

## 2015-06-12 DIAGNOSIS — D509 Iron deficiency anemia, unspecified: Secondary | ICD-10-CM | POA: Diagnosis not present

## 2015-06-12 DIAGNOSIS — E785 Hyperlipidemia, unspecified: Secondary | ICD-10-CM | POA: Diagnosis not present

## 2015-06-12 DIAGNOSIS — E2609 Other primary hyperaldosteronism: Secondary | ICD-10-CM | POA: Diagnosis not present

## 2015-06-12 DIAGNOSIS — J329 Chronic sinusitis, unspecified: Secondary | ICD-10-CM | POA: Diagnosis not present

## 2015-06-12 DIAGNOSIS — C519 Malignant neoplasm of vulva, unspecified: Secondary | ICD-10-CM | POA: Diagnosis not present

## 2015-06-12 DIAGNOSIS — Z8542 Personal history of malignant neoplasm of other parts of uterus: Secondary | ICD-10-CM | POA: Diagnosis not present

## 2015-06-12 DIAGNOSIS — G35 Multiple sclerosis: Secondary | ICD-10-CM | POA: Diagnosis not present

## 2015-06-12 DIAGNOSIS — K219 Gastro-esophageal reflux disease without esophagitis: Secondary | ICD-10-CM | POA: Diagnosis not present

## 2015-06-12 DIAGNOSIS — Z888 Allergy status to other drugs, medicaments and biological substances status: Secondary | ICD-10-CM | POA: Diagnosis not present

## 2015-06-12 DIAGNOSIS — Z885 Allergy status to narcotic agent status: Secondary | ICD-10-CM | POA: Diagnosis not present

## 2015-06-12 DIAGNOSIS — Z87891 Personal history of nicotine dependence: Secondary | ICD-10-CM | POA: Diagnosis not present

## 2015-06-12 DIAGNOSIS — G8929 Other chronic pain: Secondary | ICD-10-CM | POA: Diagnosis not present

## 2015-06-12 DIAGNOSIS — F419 Anxiety disorder, unspecified: Secondary | ICD-10-CM | POA: Diagnosis not present

## 2015-06-12 DIAGNOSIS — Z91018 Allergy to other foods: Secondary | ICD-10-CM | POA: Diagnosis not present

## 2015-06-12 DIAGNOSIS — J069 Acute upper respiratory infection, unspecified: Secondary | ICD-10-CM | POA: Diagnosis not present

## 2015-06-12 DIAGNOSIS — R7303 Prediabetes: Secondary | ICD-10-CM | POA: Diagnosis not present

## 2015-06-12 DIAGNOSIS — Z882 Allergy status to sulfonamides status: Secondary | ICD-10-CM | POA: Diagnosis not present

## 2015-06-12 DIAGNOSIS — M549 Dorsalgia, unspecified: Secondary | ICD-10-CM | POA: Diagnosis not present

## 2015-06-12 DIAGNOSIS — R05 Cough: Secondary | ICD-10-CM | POA: Diagnosis not present

## 2015-06-12 DIAGNOSIS — Z7951 Long term (current) use of inhaled steroids: Secondary | ICD-10-CM | POA: Diagnosis not present

## 2015-06-12 DIAGNOSIS — Z881 Allergy status to other antibiotic agents status: Secondary | ICD-10-CM | POA: Diagnosis not present

## 2015-06-12 DIAGNOSIS — J019 Acute sinusitis, unspecified: Secondary | ICD-10-CM | POA: Diagnosis not present

## 2015-06-12 DIAGNOSIS — K2270 Barrett's esophagus without dysplasia: Secondary | ICD-10-CM | POA: Diagnosis not present

## 2015-06-12 DIAGNOSIS — Z880 Allergy status to penicillin: Secondary | ICD-10-CM | POA: Diagnosis not present

## 2015-06-12 DIAGNOSIS — I471 Supraventricular tachycardia: Secondary | ICD-10-CM | POA: Diagnosis not present

## 2015-06-12 DIAGNOSIS — I10 Essential (primary) hypertension: Secondary | ICD-10-CM | POA: Diagnosis not present

## 2015-06-14 ENCOUNTER — Ambulatory Visit: Payer: Self-pay | Admitting: Osteopathic Medicine

## 2015-06-14 ENCOUNTER — Ambulatory Visit (INDEPENDENT_AMBULATORY_CARE_PROVIDER_SITE_OTHER): Payer: Medicare Other | Admitting: Family Medicine

## 2015-06-14 ENCOUNTER — Encounter: Payer: Self-pay | Admitting: Family Medicine

## 2015-06-14 VITALS — BP 140/88 | HR 86 | Temp 98.9°F | Ht 62.0 in | Wt 197.0 lb

## 2015-06-14 DIAGNOSIS — B349 Viral infection, unspecified: Secondary | ICD-10-CM

## 2015-06-14 DIAGNOSIS — G35 Multiple sclerosis: Secondary | ICD-10-CM | POA: Diagnosis not present

## 2015-06-14 DIAGNOSIS — I10 Essential (primary) hypertension: Secondary | ICD-10-CM | POA: Diagnosis not present

## 2015-06-14 DIAGNOSIS — J329 Chronic sinusitis, unspecified: Secondary | ICD-10-CM | POA: Diagnosis not present

## 2015-06-14 DIAGNOSIS — G8929 Other chronic pain: Secondary | ICD-10-CM | POA: Diagnosis not present

## 2015-06-14 DIAGNOSIS — B9789 Other viral agents as the cause of diseases classified elsewhere: Secondary | ICD-10-CM

## 2015-06-14 DIAGNOSIS — H578 Other specified disorders of eye and adnexa: Secondary | ICD-10-CM | POA: Diagnosis not present

## 2015-06-14 DIAGNOSIS — H02843 Edema of right eye, unspecified eyelid: Secondary | ICD-10-CM

## 2015-06-14 DIAGNOSIS — Z87891 Personal history of nicotine dependence: Secondary | ICD-10-CM | POA: Diagnosis not present

## 2015-06-14 DIAGNOSIS — Z8542 Personal history of malignant neoplasm of other parts of uterus: Secondary | ICD-10-CM | POA: Diagnosis not present

## 2015-06-14 DIAGNOSIS — H5711 Ocular pain, right eye: Secondary | ICD-10-CM | POA: Diagnosis not present

## 2015-06-14 MED ORDER — POLYMYXIN B-TRIMETHOPRIM 10000-0.1 UNIT/ML-% OP SOLN: 2.0000 [drp] | OPHTHALMIC | Status: DC

## 2015-06-14 NOTE — Progress Notes (Signed)
   Subjective:    Patient ID: Jennifer [name], female    DOB: 26-Feb-1966, 49 y.o.   MRN: [id number]  HPI  Has been sweating and really fatigued. She has had some facial pressure with some mild sinus congestion. Her blood pressure has been high as well.  Has had a HA with  It.  She took her potassium last night. Her heart has been racing as well. BP was til up this AM. BP has been running in the 160s.  Has also been complaining of right upper lid painful and swollen.  Says her eye feels tender in the inner corner.  No trauma or injury.   She went to [facility] this AM for her sinuses and for her eye.  She says at urgent care they actually put Diane her out of make sure there was no irritation or scratched her cornea and was told that it was normal. She says the vision is sometime blurry.     Review of Systems     Objective:   Physical Exam  Constitutional: She is oriented to person, place, and time. She appears well-developed and well-nourished.  HENT:  Head: Normocephalic and atraumatic.  Right Ear: External ear normal.  Left Ear: External ear normal.  Nose: Nose normal.  Mouth/Throat: Oropharynx is clear and moist.  Eyes: Conjunctivae and EOM are normal. Pupils are equal, round, and reactive to light. Right eye exhibits no discharge. Left eye exhibits no discharge. No scleral icterus.  Edge of right upper eyelid is red and swollen and tender to touch    Neck: Neck supple. No thyromegaly present.  Cardiovascular: Normal rate, regular rhythm and normal heart sounds.   Pulmonary/Chest: Effort normal and breath sounds normal. She has no wheezes.  Lymphadenopathy:    She has no cervical adenopathy.  Neurological: She is alert and oriented to person, place, and time.  Skin: Skin is warm and dry.  Psychiatric: She has a normal mood and affect.          Assessment & Plan:  Hypertension-blood pressure here in the office was okay but borderline. Encouraged her to consider restarting her  amlodipine that she sees as it's in to do that. She also has a tendency to have hypokalemia and has not been taking her potassium lately they did restart it last night. Typically when her potassium drop she starts to not feel well so encouraged her to get on that regularly and keep her appointment on Thursday.  Sinus pain pressure-most likely viral sinusitis. We can take a look again on Thursday if she feels like she's not improving or getting worse.  Edema of the upper light lid-contact dermatitis versus early cellulitis. I don't see any evidence of stye or trauma to the eye. Will treat with Polytrim eyedrops. Call if becomes more painful or change in vision. Vision test today was 20/40 in the right eye. 20/15 with corrected vision.

## 2015-06-16 ENCOUNTER — Ambulatory Visit: Payer: Self-pay | Admitting: Family Medicine

## 2015-06-16 ENCOUNTER — Ambulatory Visit (INDEPENDENT_AMBULATORY_CARE_PROVIDER_SITE_OTHER): Payer: Medicare Other | Admitting: Family Medicine

## 2015-06-16 ENCOUNTER — Encounter: Payer: Self-pay | Admitting: Family Medicine

## 2015-06-16 VITALS — BP 133/80 | HR 100 | Temp 98.4°F | Resp 18 | Wt 198.4 lb

## 2015-06-16 DIAGNOSIS — R002 Palpitations: Secondary | ICD-10-CM | POA: Diagnosis not present

## 2015-06-16 DIAGNOSIS — R0789 Other chest pain: Secondary | ICD-10-CM | POA: Diagnosis not present

## 2015-06-16 DIAGNOSIS — R635 Abnormal weight gain: Secondary | ICD-10-CM

## 2015-06-16 DIAGNOSIS — F4310 Post-traumatic stress disorder, unspecified: Secondary | ICD-10-CM

## 2015-06-16 DIAGNOSIS — J309 Allergic rhinitis, unspecified: Secondary | ICD-10-CM

## 2015-06-16 DIAGNOSIS — H02843 Edema of right eye, unspecified eyelid: Secondary | ICD-10-CM

## 2015-06-16 MED ORDER — POTASSIUM CHLORIDE 20 MEQ/15ML (10%) PO SOLN: 10.0000 meq | Freq: Every day | ORAL | Status: DC

## 2015-06-16 NOTE — Progress Notes (Signed)
   Subjective:    Patient ID: Jennifer [name], female    DOB: [birth year], 49 y.o.   MRN: [id number]  HPI Sitting in the waiting area and felt like her heart was racing and then felt like it jumped.  Still hurts some. Has had some tachy issues on and off. She did restart her potassium. A few days ago. She avoid caffeine.  She started sweating when this happened.       She is concerned about her weight gain. She had her thyroid checked about 2 weeks ago at [facility].  TSH was 1.5 which is great. Has been trying to eat more fruit. Says she does often skip meals. She admits has been eating out more often.  She is also not as active the last few months.    Still has chronic nasal congestion adn fatigued.  Says her husband has had similar sxs.  She has had mold on nad off in her current apartment., She is looking for a new place.   Swelling on right upper eyelid is mildly swollen. Has been using the Polytrim and says it gets much better.    Review of Systems     Objective:   Physical Exam  Constitutional: She is oriented to person, place, and time. She appears well-developed and well-nourished.  HENT:  Head: Normocephalic and atraumatic.  Right Ear: External ear normal.  Left Ear: External ear normal.  Nose: Nose normal.  Mouth/Throat: Oropharynx is clear and moist.  TMs and canals are clear. Some white nasal mucous.  Eyes: Conjunctivae and EOM are normal. Pupils are equal, round, and reactive to light.  Neck: Neck supple. No thyromegaly present.  Cardiovascular: Normal rate, regular rhythm and normal heart sounds.   Pulmonary/Chest: Effort normal and breath sounds normal. She has no wheezes.  Lymphadenopathy:    She has no cervical adenopathy.  Neurological: She is alert and oriented to person, place, and time.  Skin: Skin is warm and dry.  Psychiatric: She has a normal mood and affect.          Assessment & Plan:  Atypical chest pain - EKG is normal. Gave reassurance. Do not  think this is cardiac related at all area she's had multiple cardiac workups in the past. The pain actually improved while she was here in the office.  Weight gain - we have checked her thyroid is not her thyroid. I really think is secondary to her decreased activity level and we have discussed this previously. Encouraged her to be as active as she feels she is able to and she really needs to make sure that she's eating healthy low-carb diet which she does struggle with. And also needs to quit skipping meals which she also does habitually.  Anxiety/PTSD - gave reassurance. She will check to see if she needs a refill on her alprazolam. She just can't remember.  AR- she is using her fluticasone.  Zyrtec makes her feel worse.    Eyelid swelling is significantly improved.

## 2015-06-17 IMAGING — US US SOFT TISSUE HEAD/NECK
1 series · 14 of 25 positions shown · non-contrast
Comparison: CT scan of the neck 10/22/2013

CLINICAL DATA: Right cervical chain tenderness

EXAM:
THYROID ULTRASOUND
TECHNIQUE: Ultrasound examination of the thyroid gland and adjacent soft
tissues was performed.

[Series 1: us soft tissue head/neck · 0.06mm/px · 14 of 33 slices shown]
[im 1/33]
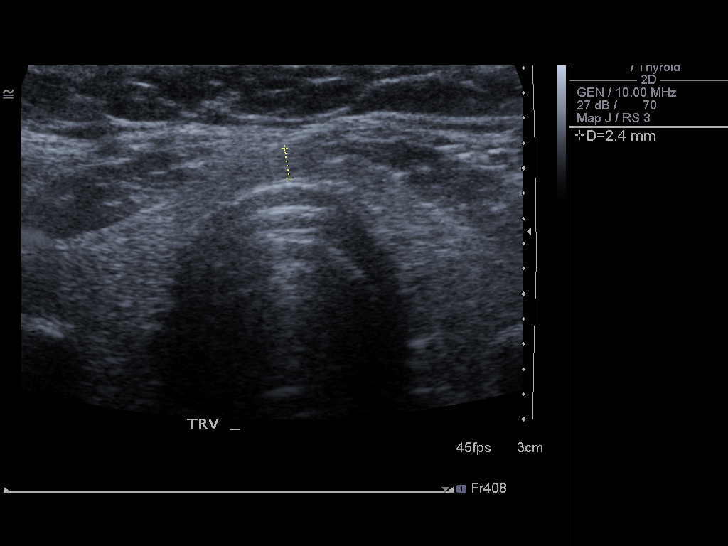
[im 3/33]
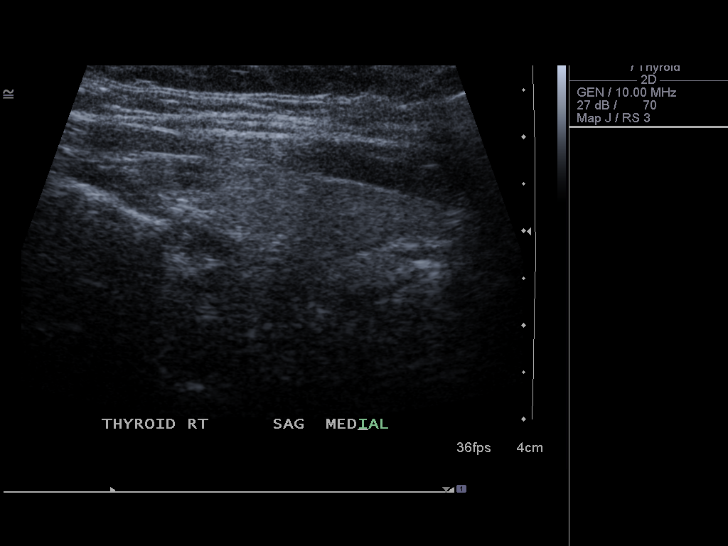
[im 6/33]
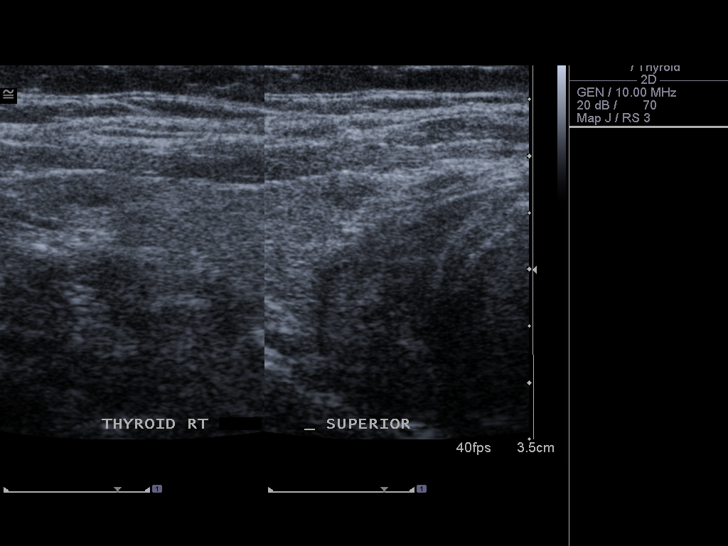
[im 9/33]
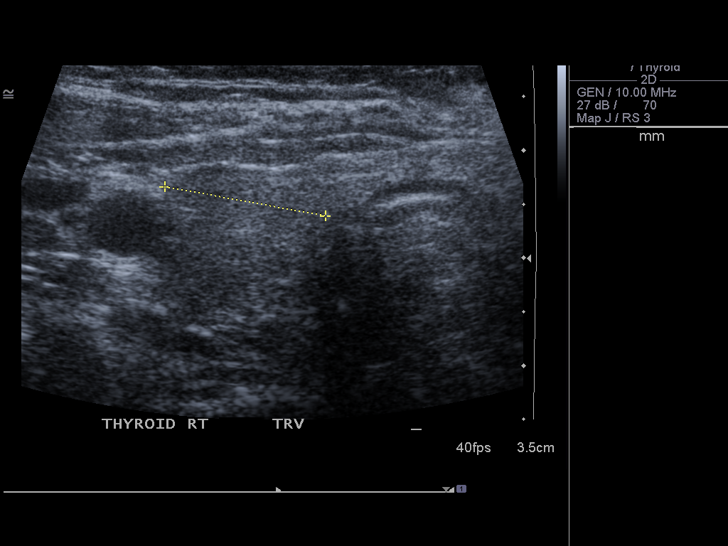
[im 11/33]
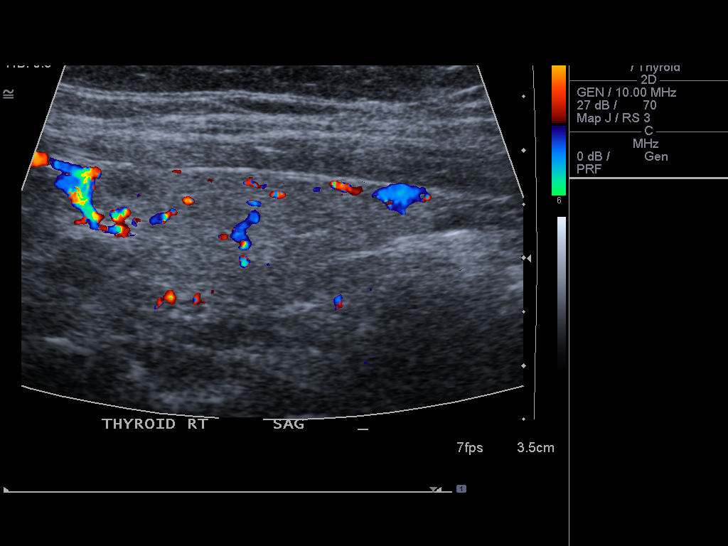
[im 13/33]
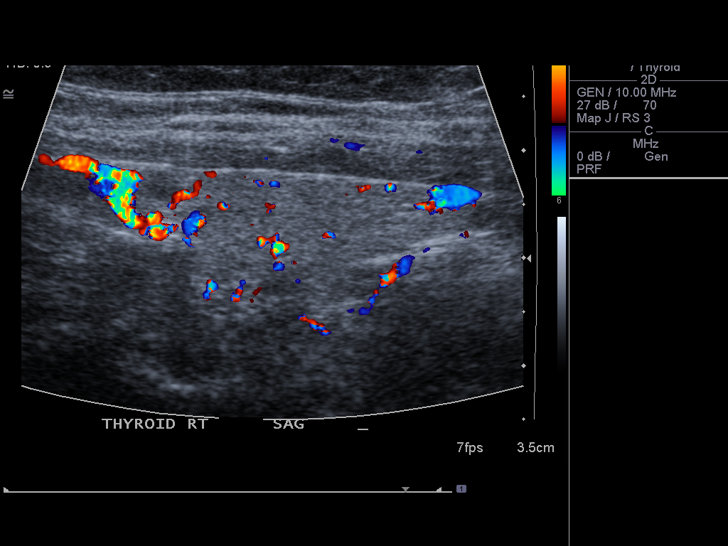
[im 15/33]
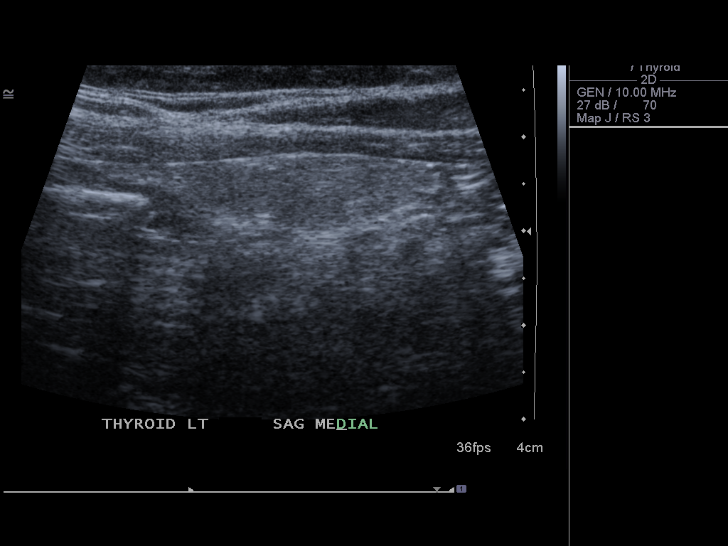
[im 18/33]
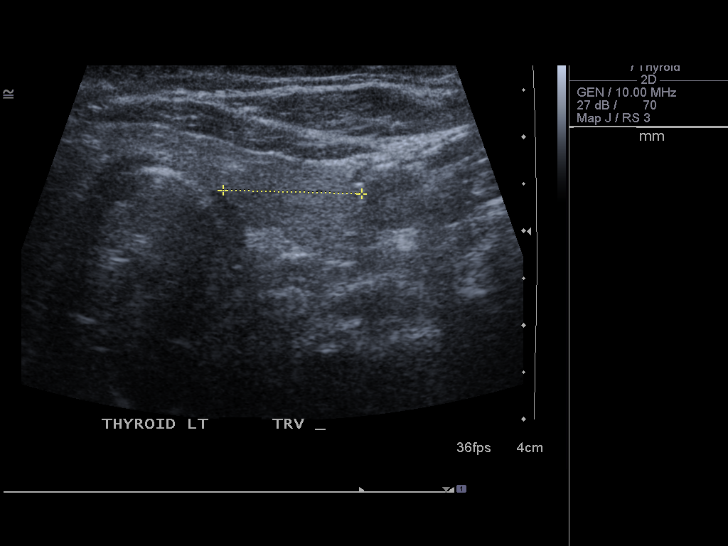
[im 21/33]
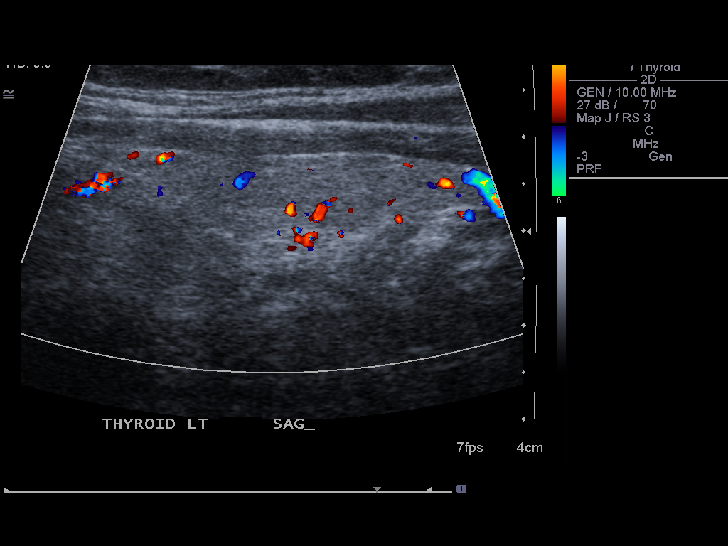
[im 22/33]
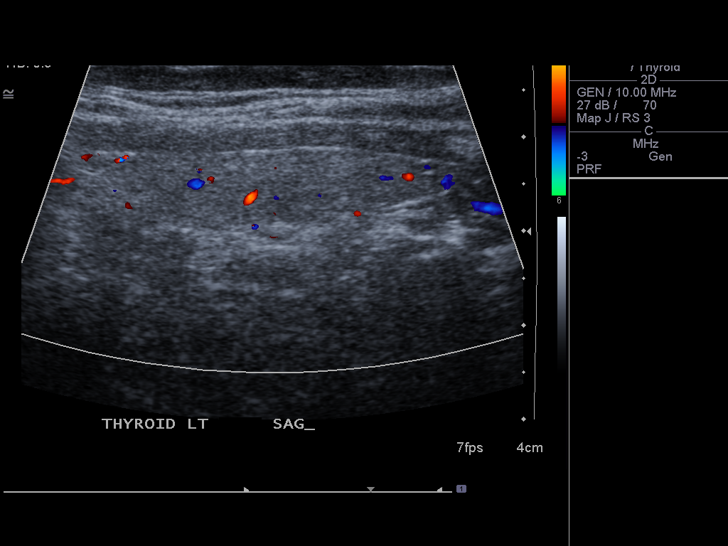
[im 25/33]
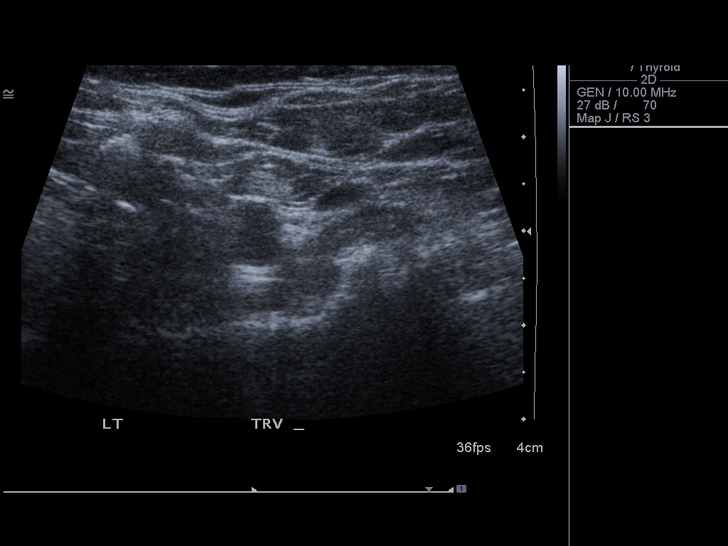
[im 27/33]
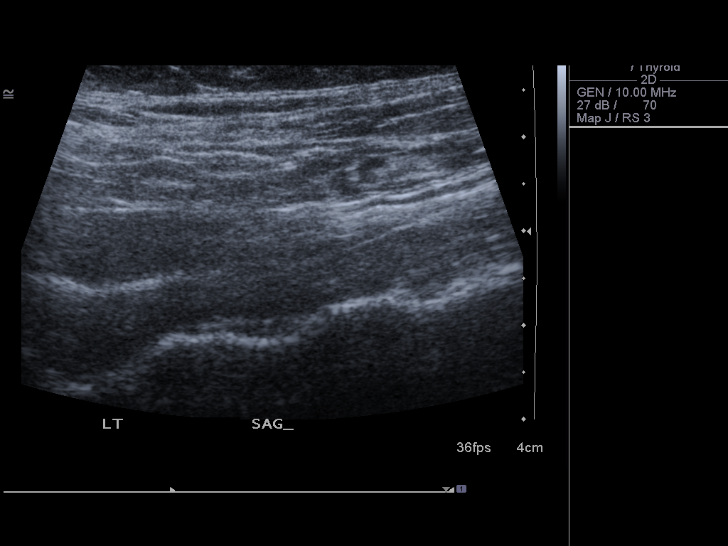
[im 30/33]
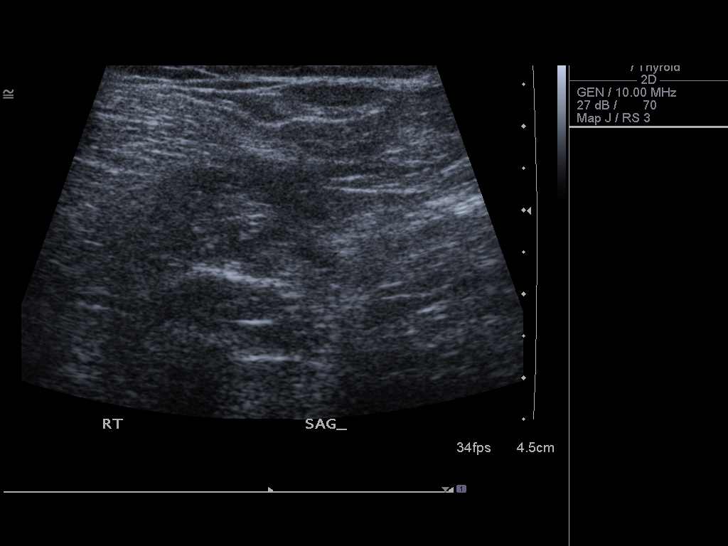
[im 33/33]
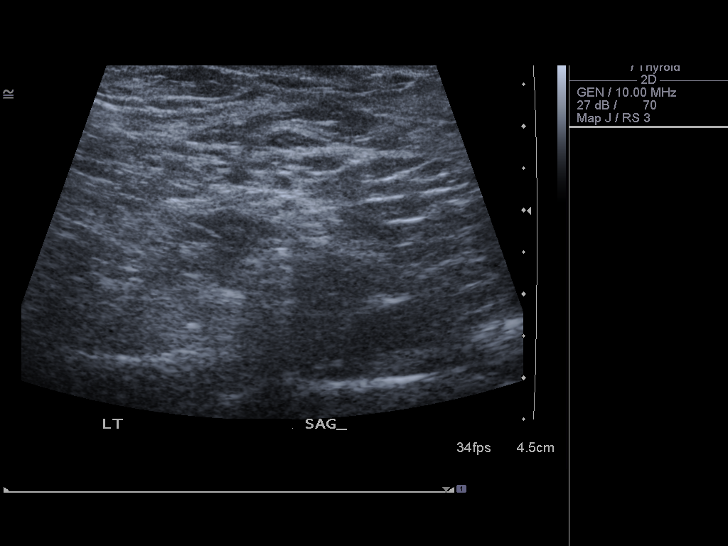

[14 of 25 positions shown; findings below may reference images not displayed]

FINDINGS: Right thyroid lobe

Measurements: 3.9 x 1.5 x 1.5 cm. Solitary 5 x 3 x 5 mm hypoechoic
nodule in the superior aspect of the gland.

Left thyroid lobe

Measurements: 3.9 x 1.4 x 1.5 cm.  No nodules visualized.

Isthmus

Thickness: 0.2 cm.  No nodules visualized.

Lymphadenopathy

None visualized.
IMPRESSION: Small 5 mm hypoechoic nodule in the superior right thyroid gland.
Findings do not meet current SRU consensus criteria for biopsy.
Follow-up by clinical exam is recommended. If patient has known risk
factors for thyroid carcinoma, consider follow-up ultrasound in 12
months. If patient is clinically hyperthyroid, consider nuclear
medicine thyroid uptake and scan.Reference: Management of Thyroid
Nodules Detected at US: Society of Radiologists in Ultrasound

Negative for suspicious cervical chain adenopathy.

## 2015-06-18 ENCOUNTER — Encounter: Payer: Self-pay | Admitting: Family Medicine

## 2015-06-23 DIAGNOSIS — G35 Multiple sclerosis: Secondary | ICD-10-CM | POA: Diagnosis not present

## 2015-06-23 DIAGNOSIS — G8929 Other chronic pain: Secondary | ICD-10-CM | POA: Diagnosis not present

## 2015-06-23 DIAGNOSIS — R1030 Lower abdominal pain, unspecified: Secondary | ICD-10-CM | POA: Diagnosis not present

## 2015-06-23 DIAGNOSIS — I10 Essential (primary) hypertension: Secondary | ICD-10-CM | POA: Diagnosis not present

## 2015-06-23 DIAGNOSIS — Z859 Personal history of malignant neoplasm, unspecified: Secondary | ICD-10-CM | POA: Diagnosis not present

## 2015-06-23 DIAGNOSIS — K5901 Slow transit constipation: Secondary | ICD-10-CM | POA: Diagnosis not present

## 2015-06-23 DIAGNOSIS — Z87891 Personal history of nicotine dependence: Secondary | ICD-10-CM | POA: Diagnosis not present

## 2015-06-24 IMAGING — CR DG FINGER THUMB 2+V*L*
3 series · 3 of 3 positions shown · non-contrast
Comparison: 01/13/2013

CLINICAL DATA: Left thumb pain for 1 month.

EXAM:
LEFT THUMB 2+V

[view not recorded (1 of 3)]
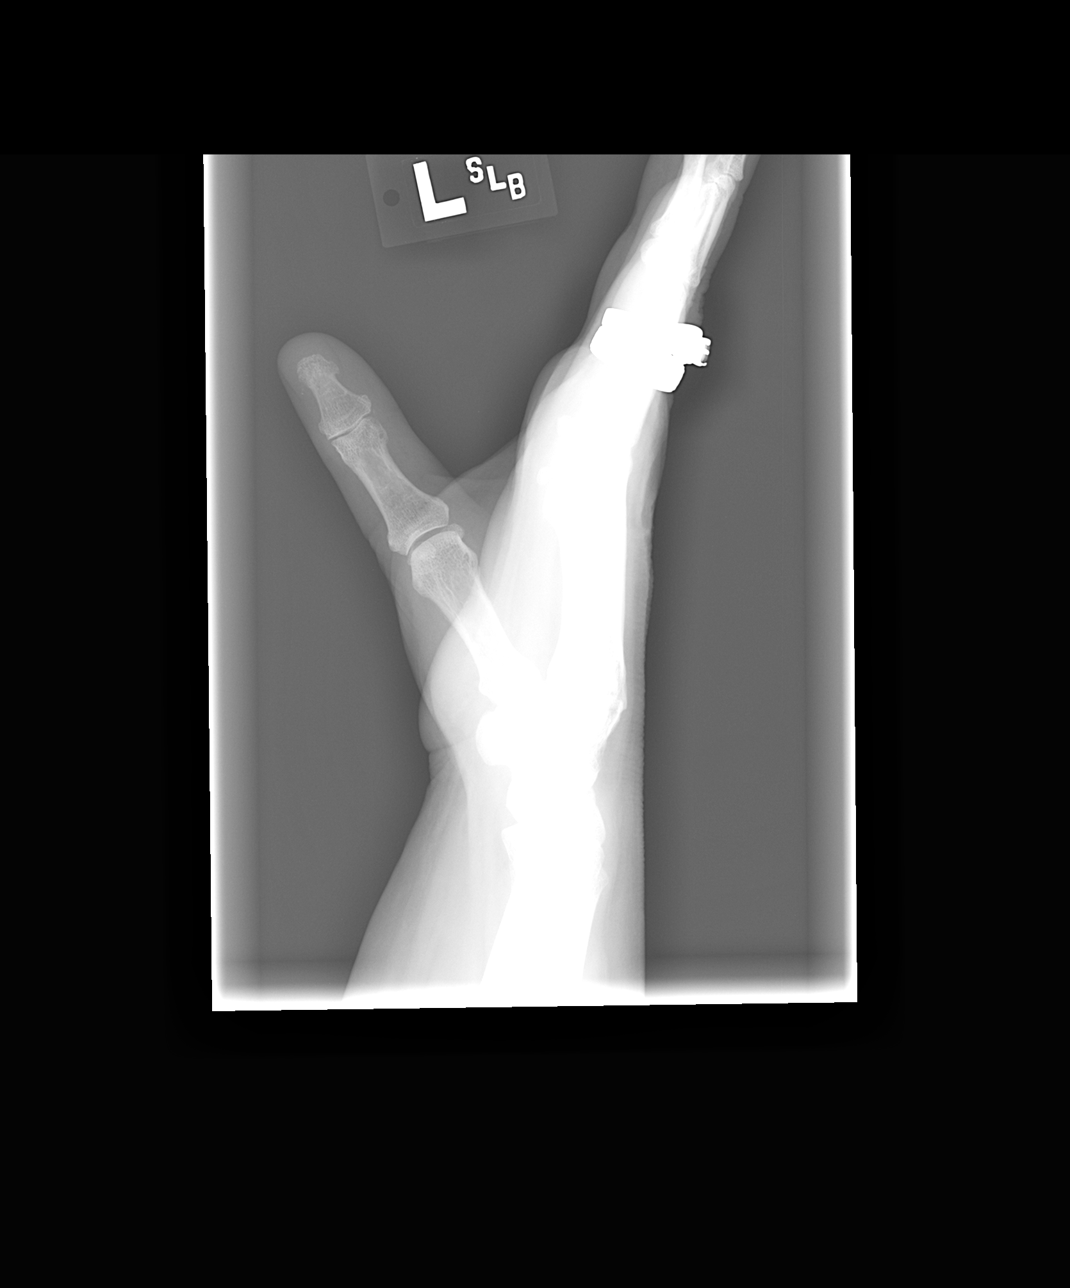

[view not recorded (2 of 3)]
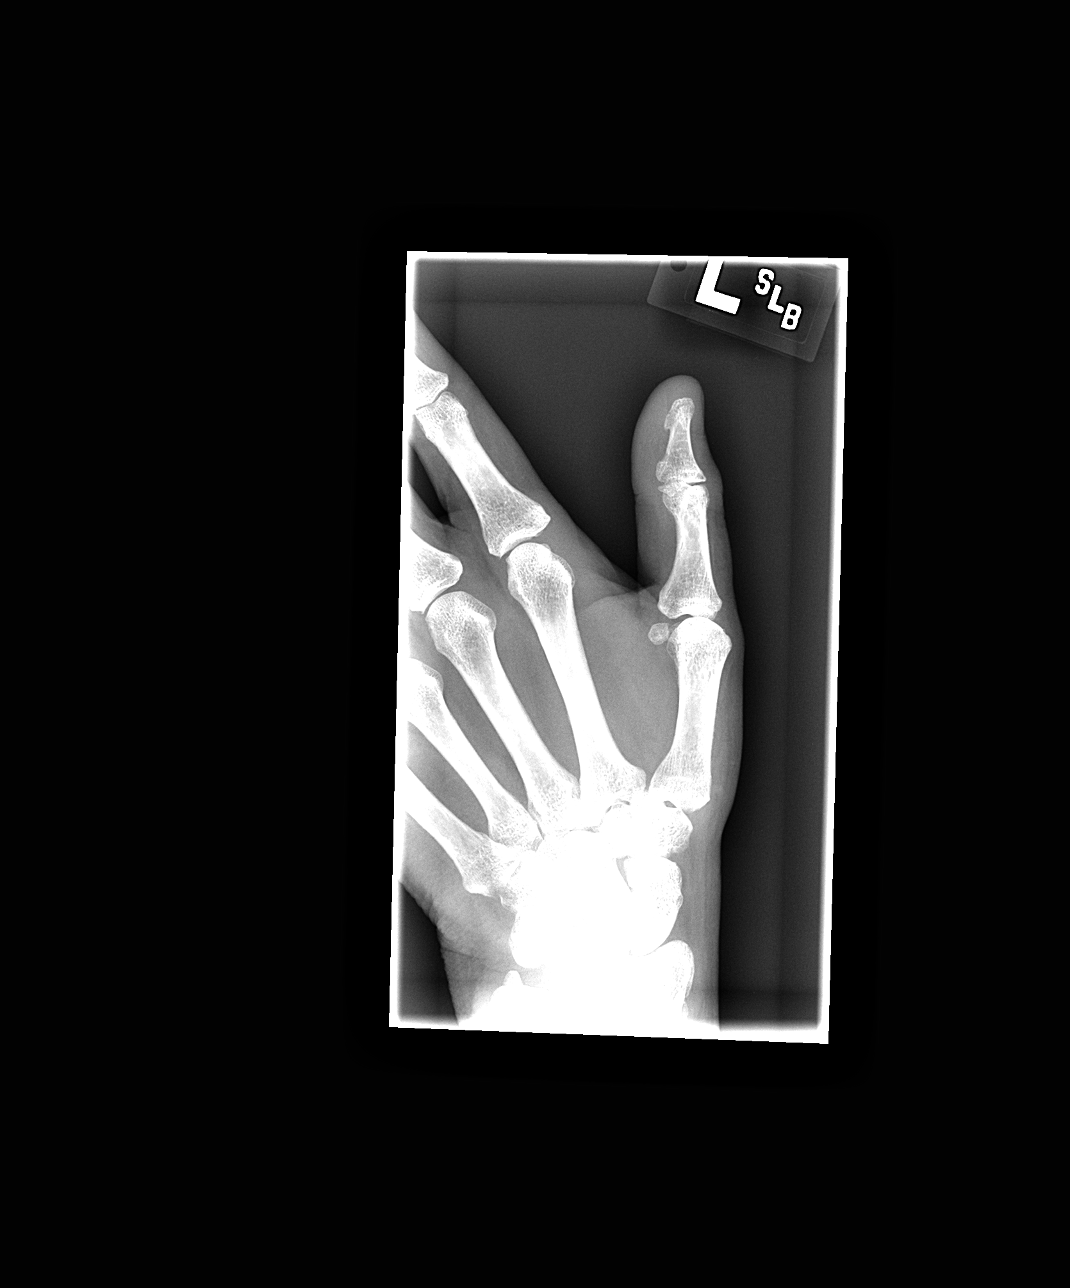

[view not recorded (3 of 3)]
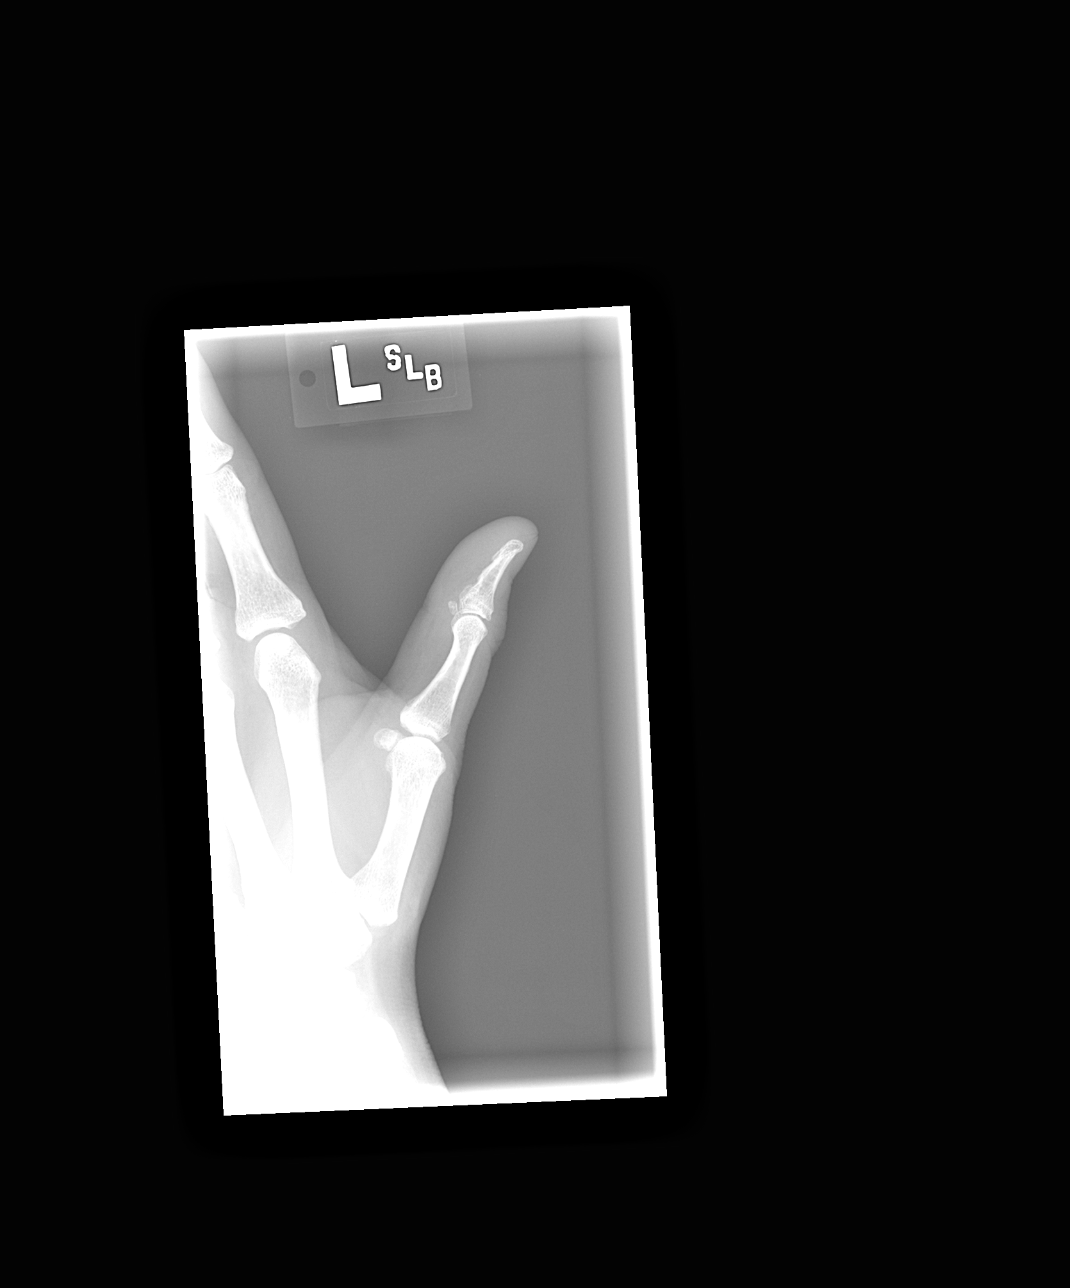

[3 of 3 positions shown; findings below may reference images not displayed]

FINDINGS: There is slight joint space narrowing and marginal osteophyte
formation at the IP joint of the thumb with a tiny soft tissue
calcification along the ulnar aspect of the IP joint. There is
slight narrowing of the radial aspect of the first metacarpal
phalangeal joint. The carpal metacarpal joint appears normal.
IMPRESSION: Slight arthritic changes primarily of the IP joint of the thumb. The
joint space narrowing at the IP joint appears new since 01/13/2013.

## 2015-06-29 ENCOUNTER — Encounter (HOSPITAL_BASED_OUTPATIENT_CLINIC_OR_DEPARTMENT_OTHER): Payer: Self-pay

## 2015-06-29 ENCOUNTER — Emergency Department (HOSPITAL_BASED_OUTPATIENT_CLINIC_OR_DEPARTMENT_OTHER)
Admission: EM | Admit: 2015-06-29 | Discharge: 2015-06-29 | Disposition: A | Discharge status: Home / Self Care | Payer: Medicare Other | Attending: Emergency Medicine | Admitting: Emergency Medicine

## 2015-06-29 DIAGNOSIS — Z8639 Personal history of other endocrine, nutritional and metabolic disease: Secondary | ICD-10-CM | POA: Insufficient documentation

## 2015-06-29 DIAGNOSIS — Z79899 Other long term (current) drug therapy: Secondary | ICD-10-CM | POA: Insufficient documentation

## 2015-06-29 DIAGNOSIS — Y9289 Other specified places as the place of occurrence of the external cause: Secondary | ICD-10-CM | POA: Diagnosis not present

## 2015-06-29 DIAGNOSIS — G8929 Other chronic pain: Secondary | ICD-10-CM | POA: Diagnosis not present

## 2015-06-29 DIAGNOSIS — I10 Essential (primary) hypertension: Secondary | ICD-10-CM | POA: Diagnosis not present

## 2015-06-29 DIAGNOSIS — S90862A Insect bite (nonvenomous), left foot, initial encounter: Secondary | ICD-10-CM | POA: Diagnosis not present

## 2015-06-29 DIAGNOSIS — Z792 Long term (current) use of antibiotics: Secondary | ICD-10-CM | POA: Diagnosis not present

## 2015-06-29 DIAGNOSIS — Y9389 Activity, other specified: Secondary | ICD-10-CM | POA: Insufficient documentation

## 2015-06-29 DIAGNOSIS — G4730 Sleep apnea, unspecified: Secondary | ICD-10-CM | POA: Insufficient documentation

## 2015-06-29 DIAGNOSIS — Z7951 Long term (current) use of inhaled steroids: Secondary | ICD-10-CM | POA: Insufficient documentation

## 2015-06-29 DIAGNOSIS — W57XXXA Bitten or stung by nonvenomous insect and other nonvenomous arthropods, initial encounter: Secondary | ICD-10-CM | POA: Diagnosis not present

## 2015-06-29 DIAGNOSIS — Z87891 Personal history of nicotine dependence: Secondary | ICD-10-CM | POA: Diagnosis not present

## 2015-06-29 DIAGNOSIS — Y998 Other external cause status: Secondary | ICD-10-CM | POA: Diagnosis not present

## 2015-06-29 DIAGNOSIS — Z8659 Personal history of other mental and behavioral disorders: Secondary | ICD-10-CM | POA: Insufficient documentation

## 2015-06-29 DIAGNOSIS — Z880 Allergy status to penicillin: Secondary | ICD-10-CM | POA: Diagnosis not present

## 2015-06-29 DIAGNOSIS — B349 Viral infection, unspecified: Secondary | ICD-10-CM | POA: Diagnosis not present

## 2015-06-29 DIAGNOSIS — Z9981 Dependence on supplemental oxygen: Secondary | ICD-10-CM | POA: Insufficient documentation

## 2015-06-29 DIAGNOSIS — K219 Gastro-esophageal reflux disease without esophagitis: Secondary | ICD-10-CM | POA: Insufficient documentation

## 2015-06-29 DIAGNOSIS — J45901 Unspecified asthma with (acute) exacerbation: Secondary | ICD-10-CM | POA: Diagnosis not present

## 2015-06-29 DIAGNOSIS — Z862 Personal history of diseases of the blood and blood-forming organs and certain disorders involving the immune mechanism: Secondary | ICD-10-CM | POA: Diagnosis not present

## 2015-06-29 DIAGNOSIS — S90562A Insect bite (nonvenomous), left ankle, initial encounter: Secondary | ICD-10-CM | POA: Diagnosis not present

## 2015-06-29 DIAGNOSIS — Z8541 Personal history of malignant neoplasm of cervix uteri: Secondary | ICD-10-CM | POA: Insufficient documentation
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
## 2015-06-29 NOTE — ED Notes (Signed)
Pt with approx dime size area to left lateral ankle-light pink in color with scaling skin-no redness/drainage/warmth noted

## 2015-06-29 NOTE — ED Provider Notes (Signed)
Patient relates a multitude of symptoms. All very benign and mild. Her main complaint is that she has a red Merrisa Skorupski on her ankle that she is concerned as a spider bite. Has breakdown of the skin there but no obvious sign of cellulitis or lymphangitis. She reports some back pain is tenderness in the musculature of her back. Reports shortness of breath that has clear breath sounds, is not tachypneic, nor hypoxemic. No chest pain. Reports a mild headache and sinus congestion. I think this is very likely all related to a viral process. I'm uncertain if this is a bite to her ankle however, no signs of complication from this. Plan is expectant management rest, Motrin, recheck as needed.  Tanna Furry, MD 06/29/15 [phone]

## 2015-06-29 NOTE — Discharge Instructions (Signed)
Keep the area of the bite clean area and recheck with any signs of infection.  Rest, push fluids and stay hydrated, Motrin as needed for body aches.

## 2015-06-29 NOTE — ED Notes (Addendum)
?   Insect/spider bite to left foot x 2 days-c/o back pain and HA with SOB-pt NAD-steady gait-pt states she called PCP to be seen today with noted c/o- was told no appt available

## 2015-07-01 ENCOUNTER — Encounter: Payer: Self-pay | Admitting: Family Medicine

## 2015-07-01 ENCOUNTER — Ambulatory Visit (INDEPENDENT_AMBULATORY_CARE_PROVIDER_SITE_OTHER): Payer: Medicare Other | Admitting: Family Medicine

## 2015-07-01 VITALS — BP 128/82 | HR 87 | Temp 98.3°F | Resp 18 | Wt 198.7 lb

## 2015-07-01 DIAGNOSIS — I10 Essential (primary) hypertension: Secondary | ICD-10-CM | POA: Diagnosis not present

## 2015-07-01 DIAGNOSIS — J329 Chronic sinusitis, unspecified: Secondary | ICD-10-CM | POA: Diagnosis not present

## 2015-07-01 DIAGNOSIS — R059 Cough, unspecified: Secondary | ICD-10-CM

## 2015-07-01 DIAGNOSIS — M549 Dorsalgia, unspecified: Secondary | ICD-10-CM | POA: Diagnosis not present

## 2015-07-01 DIAGNOSIS — R05 Cough: Secondary | ICD-10-CM | POA: Diagnosis not present

## 2015-07-01 DIAGNOSIS — T63301A Toxic effect of unspecified spider venom, accidental (unintentional), initial encounter: Secondary | ICD-10-CM

## 2015-07-01 NOTE — Progress Notes (Signed)
Subjective:    Patient ID: Jennifer [name], female    DOB: 06/14/1966, 49 y.o.   MRN: [id number]  HPI Hypertension- Pt denies chest pain, SOB, dizziness, or heart palpitations.  Taking meds as directed w/o problems.  Denies medication side effects.    Cough - Also c/o nasal congesetion.  She is still using her nasal rinse.  She is using her nasal steroid.  Can't run her humidifer bc they are moving and it is packed up.  No wheezing or SOB.  Feels like her diaphragm feels like it is tight.  Having some blood in her nose as well.   Insect bite - On the left outer ankle. At first thought the cat scratched her but then started stinging and burning.  Went to UC. Says she hs felt bad as well.  They told her likely spider bite. Says now it feels reallly dry.    Has been having some bilateal back pain - Says her stress levels have been high and she feels like that agrevates her back. Not using heat or ice.  She says before when she would get really stressed she would get a lot of tension and tightness in her chest now it seems to really affect her back. No alleviating factors or worsening factors per se. No recent injury or trauma that she has been doing a lot of packing up boxes that she is moving in the next week or 2.  Review of Systems     Objective:   Physical Exam  Constitutional: She is oriented to person, place, and time. She appears well-developed and well-nourished.  HENT:  Head: Normocephalic and atraumatic.  Right Ear: External ear normal.  Left Ear: External ear normal.  Nose: Nose normal.  Mouth/Throat: Oropharynx is clear and moist.  TMs and canals are clear.   Eyes: Conjunctivae and EOM are normal. Pupils are equal, round, and reactive to light.  Neck: Neck supple. No thyromegaly present.  Cardiovascular: Normal rate, regular rhythm and normal heart sounds.   Pulmonary/Chest: Effort normal and breath sounds normal. She has no wheezes.  Lymphadenopathy:    She has no cervical  adenopathy.  Neurological: She is alert and oriented to person, place, and time.  Skin: Skin is warm and dry.  Psychiatric: She has a normal mood and affect.    She has an approximately 1 cm oval shaped area on the left outer ankle that looks like the skin has peeled surface of rim of dry skin. The inner area appears to be healing well. There is just a slight bit of erythema around the edge. No induration. It does appear to be dry.      Assessment & Plan:  HTN - repeat blood pressure looks fantastic. We'll continue to monitor. She does feel little bit lightheaded today.  Cough/sinusitis-I think she just has chronic nasal symptoms at this point. She considers she still could have a virus. I really want out off on antibiotic that this point. Check she still has an old prescription sitting at the pharmacy from the last time that she was sick but she never picked up. So certainly if she starts to feel worse over the weekend she is welcome pick that up and start taking it.   Spider bite - looks like it is healing well.  Some rednes around the border. Use mupiricin ointment. Call if she feels like it's getting worse or larger. I really don't think her symptoms at all are related to spider  bite. I think is just her cough and nasal congestion.  Bilateral mid back pain-work on gentle stretches. Recommend that she is a heating pad and/or ice as needed. She certainly could try taking a half a tab of her Valium in the evening at bedtime to help relax the muscles if she still has her prescription. If she's completely out and she can call us back and let us know.

## 2015-07-02 IMAGING — US US ABDOMEN COMPLETE
1 series · 13 of 25 positions shown · non-contrast
Comparison: [REDACTED] right upper quadrant
ultrasound 04/06/2014 and earlier.

CLINICAL DATA: 48-year-old female with right upper quadrant pain
and nausea. Gallstones. Initial encounter.

EXAM:
ULTRASOUND ABDOMEN COMPLETE

[Series 1: us abdomen complete · 0.35mm/px · 13 of 68 slices shown]
[im 1/68]
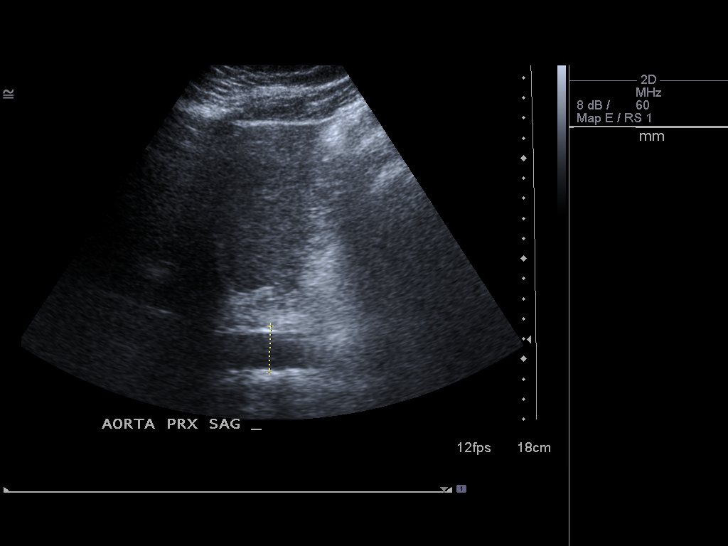
[im 6/68]
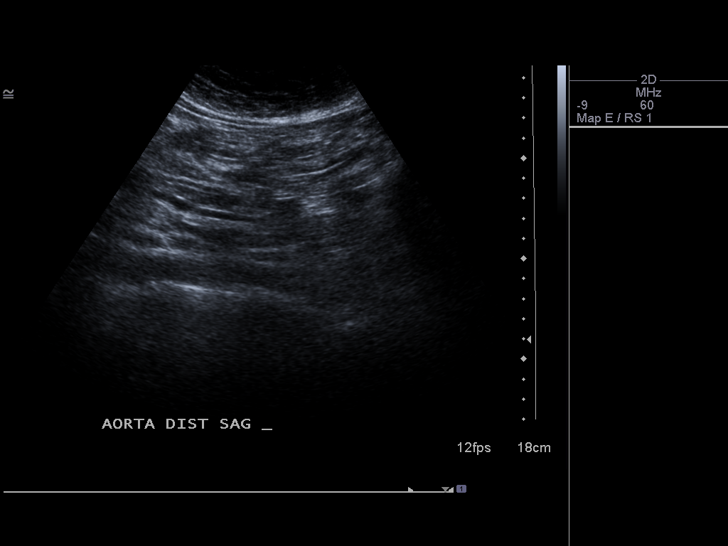
[im 12/68]
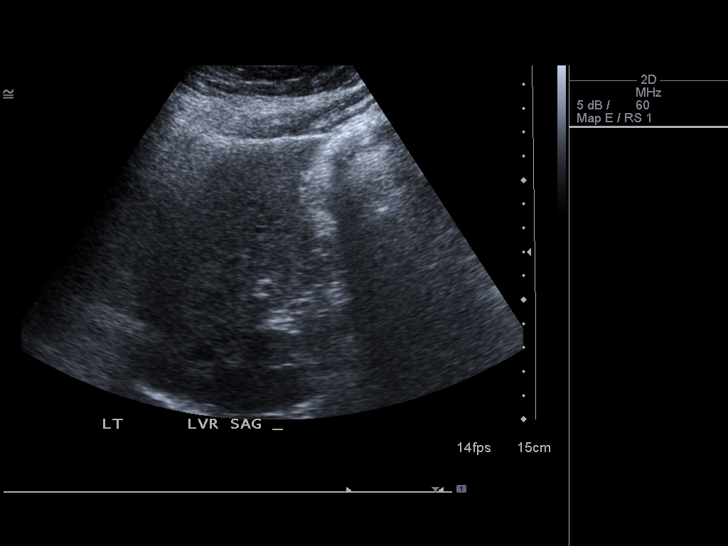
[im 17/68]
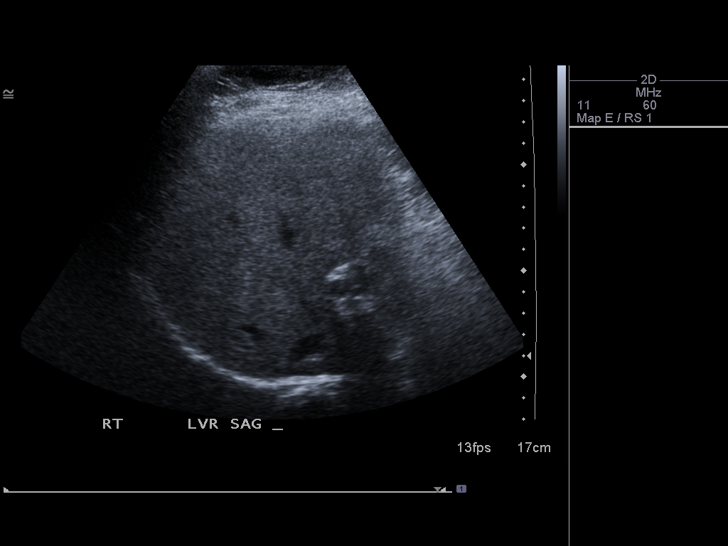
[im 23/68]
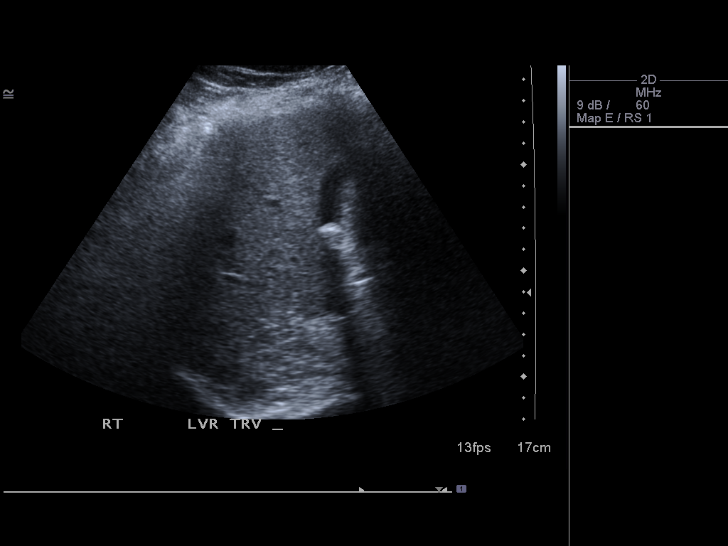
[im 28/68]
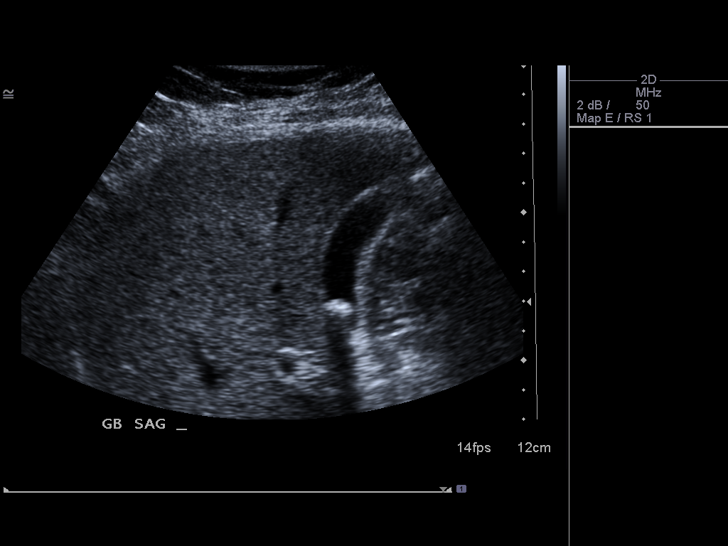
[im 34/68]
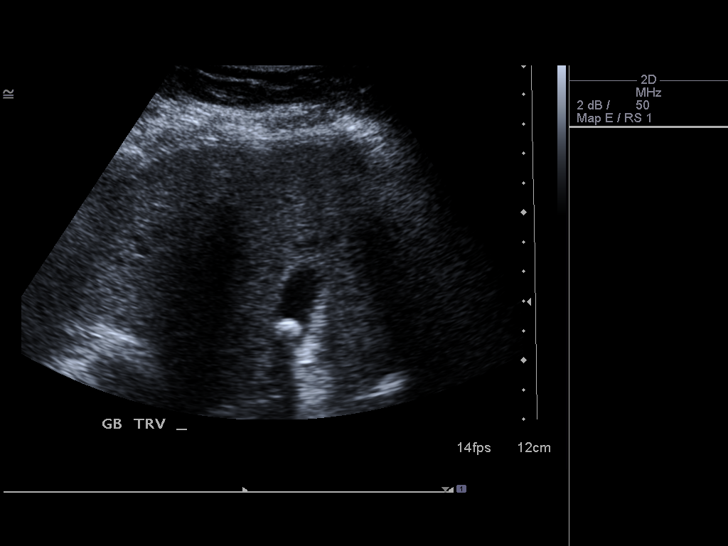
[im 40/68]
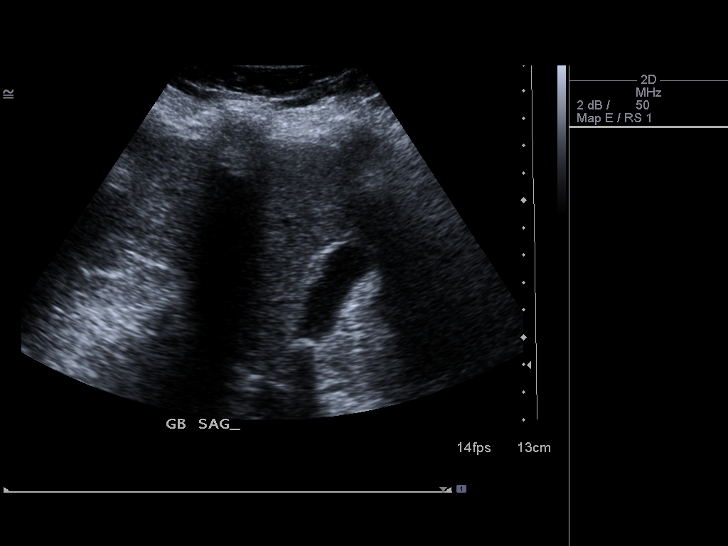
[im 45/68]
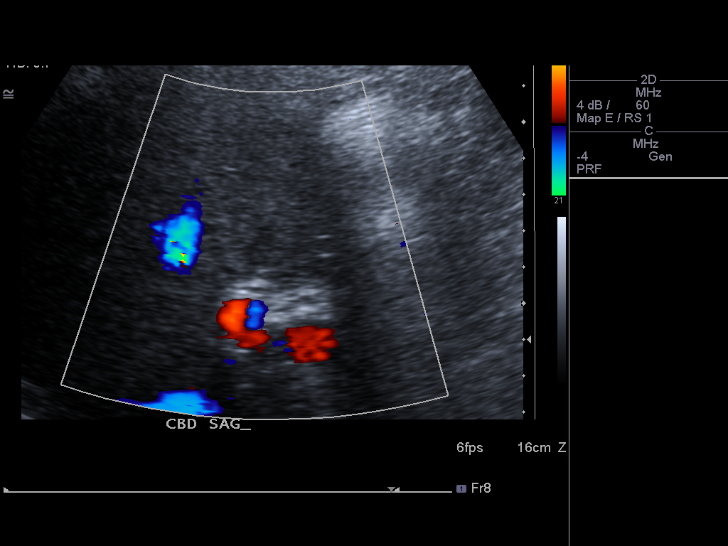
[im 51/68]
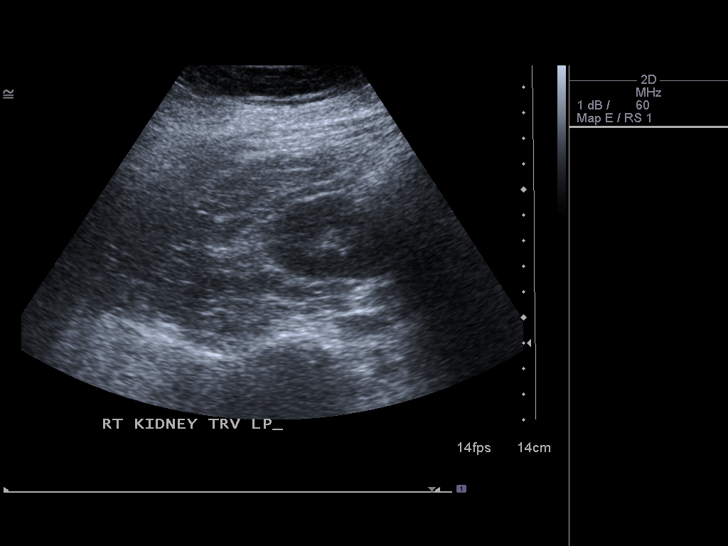
[im 56/68]
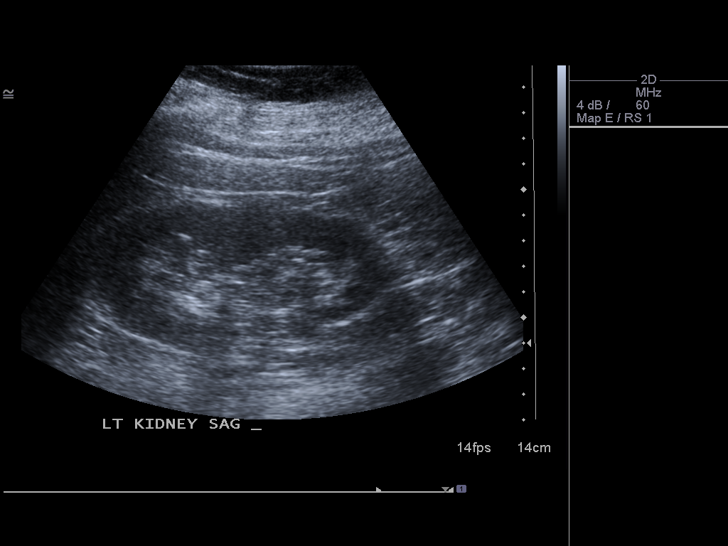
[im 62/68]
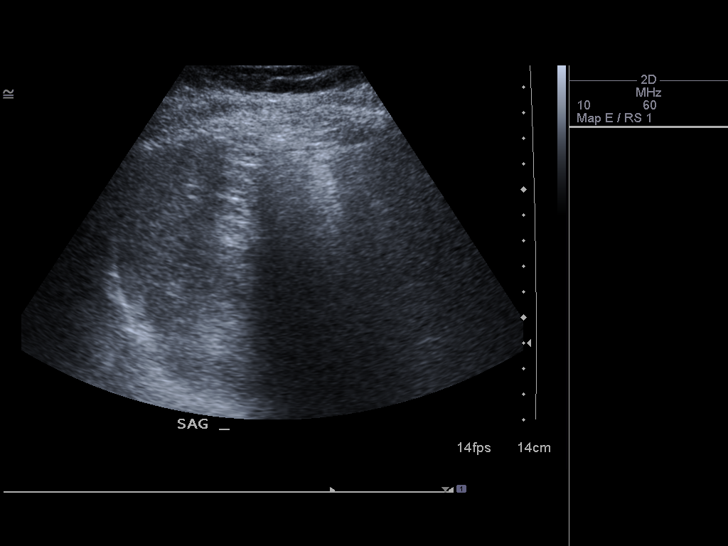
[im 68/68]
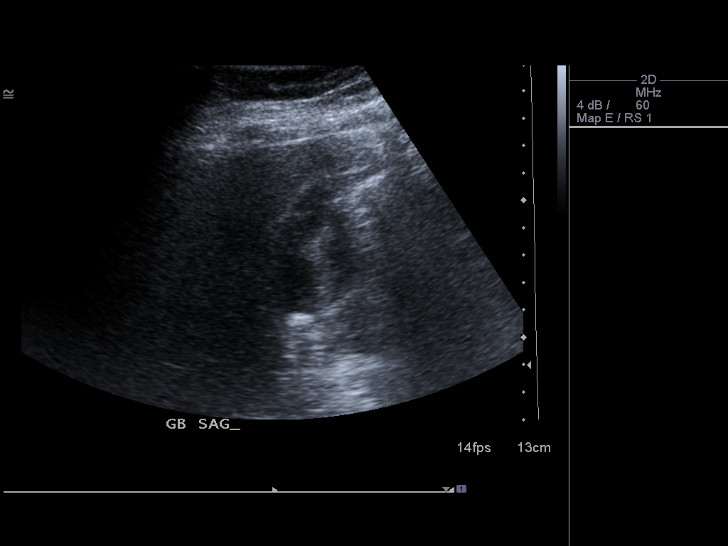

[13 of 25 positions shown; findings below may reference images not displayed]

FINDINGS: Gallbladder:

Nonmobile gallstone in the gallbladder neck, 9 mm diameter (image
31). Still, gallbladder wall thickness remains normal at 2 mm and no
sonographic Murphy sign is elicited.

Common bile duct:

Diameter: 2 mm, normal

Liver:

Mildly echogenic. No intrahepatic biliary ductal dilatation. No
discrete liver lesion.

IVC:

Incompletely visualized due to overlying bowel gas, visualized
portions within normal limits.

Pancreas:

Incompletely visualized due to overlying bowel gas, visualized
portions within normal limits.

Spleen:

Incompletely visualized due to overlying bowel gas, visualized
portions within normal limits.

Right Kidney:

Length: 11.6 cm. Echogenicity within normal limits. No mass or
hydronephrosis visualized.

Left Kidney:

Length: 11.3 cm. Echogenicity within normal limits. No mass or
hydronephrosis visualized.

Abdominal aorta:

Incompletely visualized due to overlying bowel gas, visualized
portions within normal limits.

Other findings:

None.
IMPRESSION: 1. A 9 mm gallstone appears to be lodged in the gallbladder neck,
but no wall thickening or sonographic Murphy sign to strongly
suggest acute cholecystitis.
2. No evidence of biliary obstruction.

## 2015-07-04 DIAGNOSIS — K5900 Constipation, unspecified: Secondary | ICD-10-CM | POA: Diagnosis not present

## 2015-07-04 DIAGNOSIS — Z79899 Other long term (current) drug therapy: Secondary | ICD-10-CM | POA: Diagnosis not present

## 2015-07-04 DIAGNOSIS — I472 Ventricular tachycardia: Secondary | ICD-10-CM | POA: Diagnosis not present

## 2015-07-04 DIAGNOSIS — Z880 Allergy status to penicillin: Secondary | ICD-10-CM | POA: Diagnosis not present

## 2015-07-04 DIAGNOSIS — Z91018 Allergy to other foods: Secondary | ICD-10-CM | POA: Diagnosis not present

## 2015-07-04 DIAGNOSIS — R1031 Right lower quadrant pain: Secondary | ICD-10-CM | POA: Diagnosis not present

## 2015-07-04 DIAGNOSIS — E785 Hyperlipidemia, unspecified: Secondary | ICD-10-CM | POA: Diagnosis not present

## 2015-07-04 DIAGNOSIS — Z888 Allergy status to other drugs, medicaments and biological substances status: Secondary | ICD-10-CM | POA: Diagnosis not present

## 2015-07-04 DIAGNOSIS — R110 Nausea: Secondary | ICD-10-CM | POA: Diagnosis not present

## 2015-07-04 DIAGNOSIS — Z882 Allergy status to sulfonamides status: Secondary | ICD-10-CM | POA: Diagnosis not present

## 2015-07-04 DIAGNOSIS — Z792 Long term (current) use of antibiotics: Secondary | ICD-10-CM | POA: Diagnosis not present

## 2015-07-04 DIAGNOSIS — Z885 Allergy status to narcotic agent status: Secondary | ICD-10-CM | POA: Diagnosis not present

## 2015-07-04 DIAGNOSIS — Z87891 Personal history of nicotine dependence: Secondary | ICD-10-CM | POA: Diagnosis not present

## 2015-07-04 DIAGNOSIS — R569 Unspecified convulsions: Secondary | ICD-10-CM | POA: Diagnosis not present

## 2015-07-04 DIAGNOSIS — E269 Hyperaldosteronism, unspecified: Secondary | ICD-10-CM | POA: Diagnosis not present

## 2015-07-04 DIAGNOSIS — G35 Multiple sclerosis: Secondary | ICD-10-CM | POA: Diagnosis not present

## 2015-07-04 DIAGNOSIS — K219 Gastro-esophageal reflux disease without esophagitis: Secondary | ICD-10-CM | POA: Diagnosis not present

## 2015-07-04 DIAGNOSIS — Z79891 Long term (current) use of opiate analgesic: Secondary | ICD-10-CM | POA: Diagnosis not present

## 2015-07-04 DIAGNOSIS — I10 Essential (primary) hypertension: Secondary | ICD-10-CM | POA: Diagnosis not present

## 2015-07-04 DIAGNOSIS — Z791 Long term (current) use of non-steroidal anti-inflammatories (NSAID): Secondary | ICD-10-CM | POA: Diagnosis not present

## 2015-07-04 DIAGNOSIS — D509 Iron deficiency anemia, unspecified: Secondary | ICD-10-CM | POA: Diagnosis not present

## 2015-07-04 DIAGNOSIS — R1030 Lower abdominal pain, unspecified: Secondary | ICD-10-CM | POA: Diagnosis not present

## 2015-07-04 DIAGNOSIS — C519 Malignant neoplasm of vulva, unspecified: Secondary | ICD-10-CM | POA: Diagnosis not present

## 2015-07-04 DIAGNOSIS — Z7951 Long term (current) use of inhaled steroids: Secondary | ICD-10-CM | POA: Diagnosis not present

## 2015-07-04 DIAGNOSIS — Z881 Allergy status to other antibiotic agents status: Secondary | ICD-10-CM | POA: Diagnosis not present

## 2015-07-04 DIAGNOSIS — F419 Anxiety disorder, unspecified: Secondary | ICD-10-CM | POA: Diagnosis not present

## 2015-07-04 DIAGNOSIS — J45909 Unspecified asthma, uncomplicated: Secondary | ICD-10-CM | POA: Diagnosis not present

## 2015-07-04 DIAGNOSIS — Z8542 Personal history of malignant neoplasm of other parts of uterus: Secondary | ICD-10-CM | POA: Diagnosis not present

## 2015-07-04 LAB — BASIC METABOLIC PANEL
BUN: 12 mg/dL (ref 4–21)
Creatinine: 0.7 mg/dL (ref 0.5–1.1)
Glucose: 91 mg/dL
Potassium: 3.6 mmol/L (ref 3.4–5.3)
Sodium: 145 mmol/L (ref 137–147)

## 2015-07-04 LAB — HEPATIC FUNCTION PANEL
ALT: 23 U/L (ref 7–35)
AST: 14 U/L (ref 13–35)
Alkaline Phosphatase: 103 U/L (ref 25–125)
Bilirubin, Total: 0.3 mg/dL

## 2015-07-04 IMAGING — CR DG ABDOMEN 2V
1 series · 1 of 1 positions shown · non-contrast
Comparison: None.

CLINICAL DATA: Abdominal pain

EXAM:
ABDOMEN - 2 VIEW

[view not recorded]
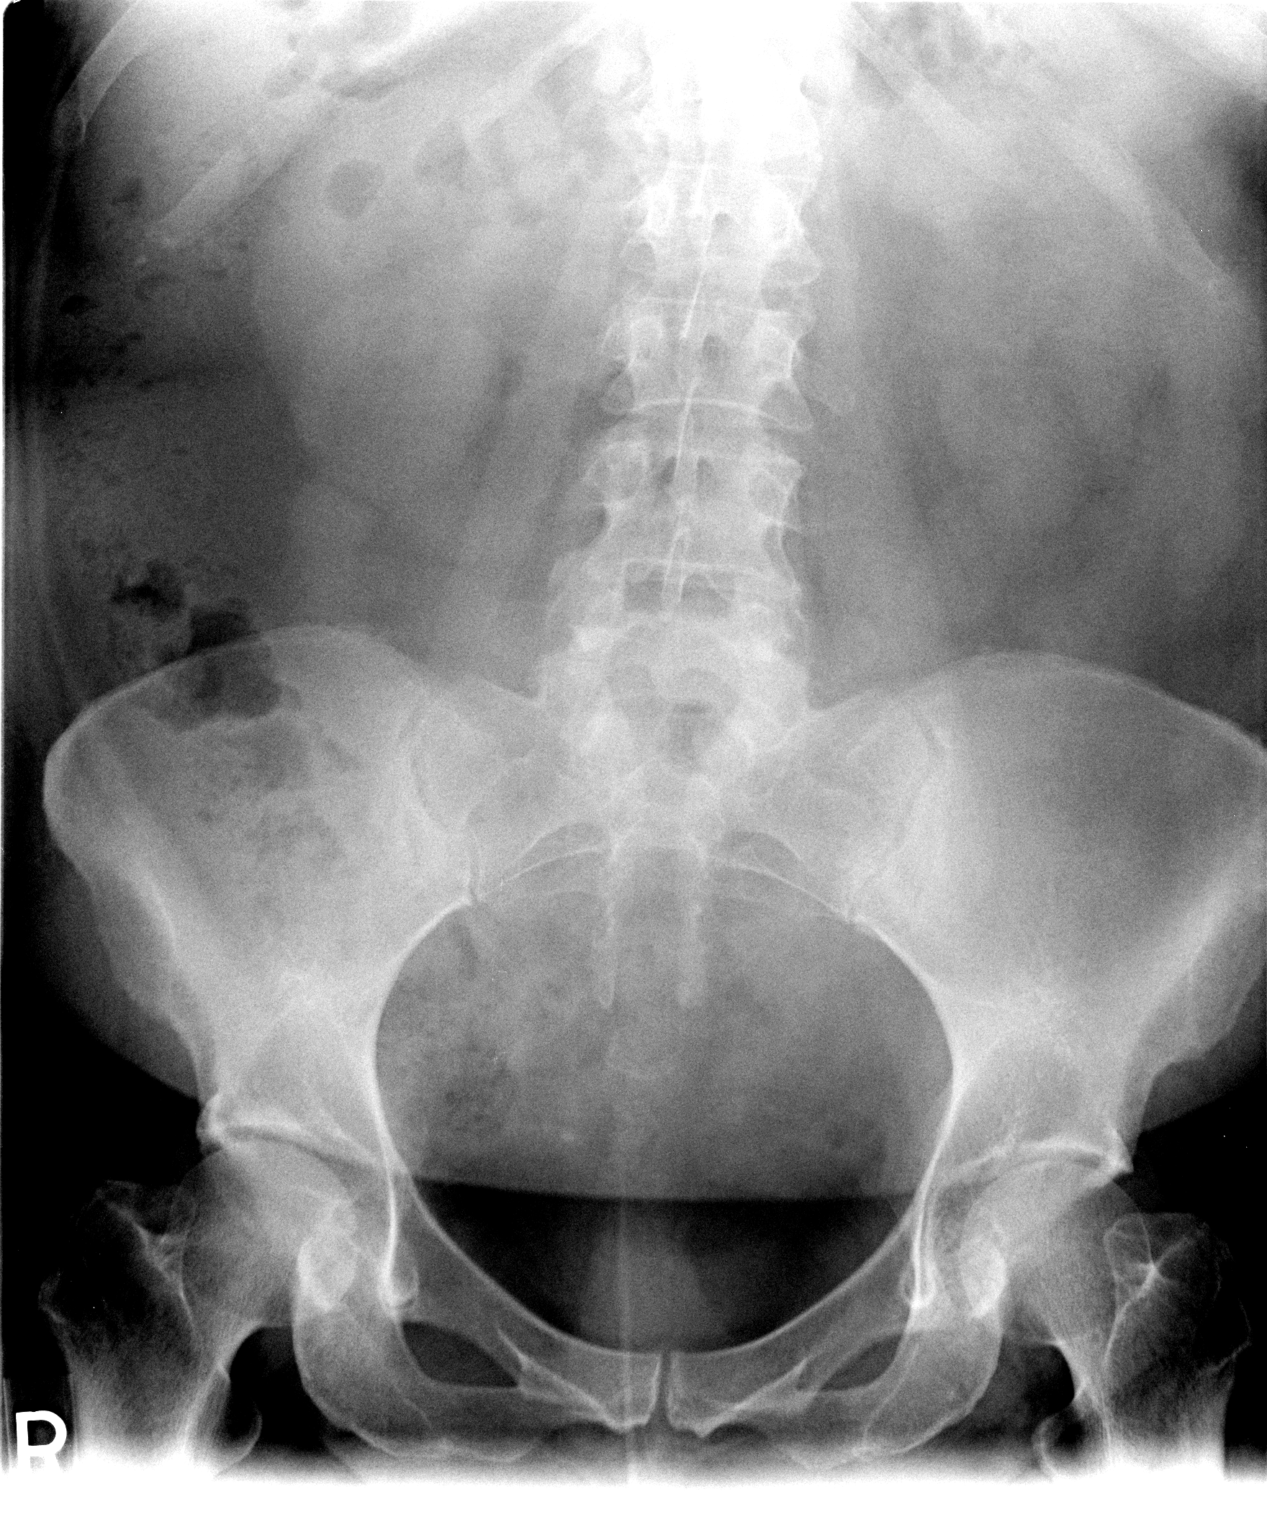

[1 of 1 positions shown; findings below may reference images not displayed]

FINDINGS: Scattered large and small bowel gas is noted. No abnormal mass or
abnormal calcifications are seen. No acute bony abnormality is
noted. No free air is noted.
IMPRESSION: No acute abnormality seen.

## 2015-07-05 ENCOUNTER — Encounter: Payer: Self-pay | Admitting: Emergency Medicine

## 2015-07-06 ENCOUNTER — Emergency Department (HOSPITAL_BASED_OUTPATIENT_CLINIC_OR_DEPARTMENT_OTHER): Payer: Medicare Other

## 2015-07-06 ENCOUNTER — Emergency Department (HOSPITAL_BASED_OUTPATIENT_CLINIC_OR_DEPARTMENT_OTHER)
Admission: EM | Admit: 2015-07-06 | Discharge: 2015-07-06 | Disposition: A | Discharge status: Home / Self Care | Payer: Medicare Other | Attending: Emergency Medicine | Admitting: Emergency Medicine

## 2015-07-06 ENCOUNTER — Encounter (HOSPITAL_BASED_OUTPATIENT_CLINIC_OR_DEPARTMENT_OTHER): Payer: Self-pay | Admitting: Emergency Medicine

## 2015-07-06 DIAGNOSIS — Z87891 Personal history of nicotine dependence: Secondary | ICD-10-CM | POA: Insufficient documentation

## 2015-07-06 DIAGNOSIS — J45901 Unspecified asthma with (acute) exacerbation: Secondary | ICD-10-CM | POA: Insufficient documentation

## 2015-07-06 DIAGNOSIS — R2243 Localized swelling, mass and lump, lower limb, bilateral: Secondary | ICD-10-CM | POA: Diagnosis not present

## 2015-07-06 DIAGNOSIS — K219 Gastro-esophageal reflux disease without esophagitis: Secondary | ICD-10-CM | POA: Insufficient documentation

## 2015-07-06 DIAGNOSIS — I10 Essential (primary) hypertension: Secondary | ICD-10-CM | POA: Diagnosis not present

## 2015-07-06 DIAGNOSIS — R05 Cough: Secondary | ICD-10-CM

## 2015-07-06 DIAGNOSIS — Z9981 Dependence on supplemental oxygen: Secondary | ICD-10-CM | POA: Diagnosis not present

## 2015-07-06 DIAGNOSIS — Z7951 Long term (current) use of inhaled steroids: Secondary | ICD-10-CM | POA: Insufficient documentation

## 2015-07-06 DIAGNOSIS — Z880 Allergy status to penicillin: Secondary | ICD-10-CM | POA: Diagnosis not present

## 2015-07-06 DIAGNOSIS — M549 Dorsalgia, unspecified: Secondary | ICD-10-CM | POA: Diagnosis not present

## 2015-07-06 DIAGNOSIS — R0602 Shortness of breath: Secondary | ICD-10-CM

## 2015-07-06 DIAGNOSIS — Z79899 Other long term (current) drug therapy: Secondary | ICD-10-CM | POA: Diagnosis not present

## 2015-07-06 DIAGNOSIS — R059 Cough, unspecified: Secondary | ICD-10-CM

## 2015-07-06 DIAGNOSIS — Z8659 Personal history of other mental and behavioral disorders: Secondary | ICD-10-CM | POA: Diagnosis not present

## 2015-07-06 DIAGNOSIS — Z8542 Personal history of malignant neoplasm of other parts of uterus: Secondary | ICD-10-CM | POA: Insufficient documentation

## 2015-07-06 DIAGNOSIS — G4730 Sleep apnea, unspecified: Secondary | ICD-10-CM | POA: Insufficient documentation

## 2015-07-06 DIAGNOSIS — J4521 Mild intermittent asthma with (acute) exacerbation: Secondary | ICD-10-CM | POA: Diagnosis not present

## 2015-07-06 DIAGNOSIS — J069 Acute upper respiratory infection, unspecified: Secondary | ICD-10-CM | POA: Diagnosis not present

## 2015-07-06 DIAGNOSIS — G35 Multiple sclerosis: Secondary | ICD-10-CM | POA: Diagnosis not present

## 2015-07-06 DIAGNOSIS — Z862 Personal history of diseases of the blood and blood-forming organs and certain disorders involving the immune mechanism: Secondary | ICD-10-CM | POA: Diagnosis not present

## 2015-07-06 DIAGNOSIS — G8929 Other chronic pain: Secondary | ICD-10-CM | POA: Insufficient documentation

## 2015-07-06 DIAGNOSIS — Z8614 Personal history of Methicillin resistant Staphylococcus aureus infection: Secondary | ICD-10-CM | POA: Diagnosis not present

## 2015-07-06 DIAGNOSIS — R0981 Nasal congestion: Secondary | ICD-10-CM | POA: Diagnosis not present

## 2015-07-06 LAB — BASIC METABOLIC PANEL
Anion gap: 8 (ref 5–15)
BUN: 16 mg/dL (ref 6–20)
CO2: 24 mmol/L (ref 22–32)
Calcium: 9.2 mg/dL (ref 8.9–10.3)
Chloride: 108 mmol/L (ref 101–111)
Creatinine, Ser: 0.65 mg/dL (ref 0.44–1.00)
GFR calc Af Amer: >60 mL/min (ref >60)
GFR calc non Af Amer: >60 mL/min (ref >60)
Glucose, Bld: 164 mg/dL — ABNORMAL HIGH (ref 65–99)
Potassium: 3.8 mmol/L (ref 3.5–5.1)
Sodium: 140 mmol/L (ref 135–145)

## 2015-07-06 LAB — CBC WITH DIFFERENTIAL/PLATELET
Basophils Absolute: 0 10*3/uL (ref 0.0–0.1)
Basophils Relative: 0 %
Eosinophils Absolute: 0 10*3/uL (ref 0.0–0.7)
Eosinophils Relative: 0 %
HCT: 42.2 % (ref 36.0–46.0)
Hemoglobin: 13.9 g/dL (ref 12.0–15.0)
Lymphocytes Relative: 9 %
Lymphs Abs: 0.8 10*3/uL (ref 0.7–4.0)
MCH: 29.4 pg (ref 26.0–34.0)
MCHC: 32.9 g/dL (ref 30.0–36.0)
MCV: 89.4 fL (ref 78.0–100.0)
Monocytes Absolute: 0.2 10*3/uL (ref 0.1–1.0)
Monocytes Relative: 2 %
Neutro Abs: 7.7 10*3/uL (ref 1.7–7.7)
Neutrophils Relative %: 89 %
Platelets: 207 10*3/uL (ref 150–400)
RBC: 4.72 MIL/uL (ref 3.87–5.11)
RDW: 14.5 % (ref 11.5–15.5)
WBC: 8.6 10*3/uL (ref 4.0–10.5)

## 2015-07-06 LAB — D-DIMER, QUANTITATIVE (NOT AT ARMC): D-Dimer, Quant: <0.27 ug/mL-FEU (ref 0.00–0.50)

## 2015-07-06 MED ORDER — ACETAMINOPHEN 500 MG PO TABS: 500.0000 mg | ORAL_TABLET | Freq: Four times a day (QID) | ORAL | Status: DC | PRN

## 2015-07-06 NOTE — ED Notes (Signed)
Patient states she started having an asthma attack today.  She states she couldn't get into her doctor and she was seen at [facility].  She started feeling worse and came here.

## 2015-07-06 NOTE — ED Notes (Signed)
Complains of increased shortness of breath and nasal congestion.  Reports went to [facility] and was dx with asthma flare up but they did no test.  Reports back pain and abdominal and vaginal swelling.  Reports pain in woke up this am to a sharp pain in right leg that shot up leg.  Pt reports it feels like someone is squeezing her lungs and she can't breath.  Pt is in no distress but is tearful upon assessment.  Pt reports she usually doesn't have to use her inhaler but has had to use it multiple times today.

## 2015-07-06 NOTE — Discharge Instructions (Signed)
1. Medications: tylenol, usual home medications 2. Treatment: rest, drink plenty of fluids 3. Follow Up: please followup with your primary doctor for discussion of your diagnoses and further evaluation after today's visit; please return to the ER for fever, severe chest pain or shortness of breath, new or worsening symptoms   Cough, Adult Coughing is a reflex that clears your throat and your airways. Coughing helps to heal and protect your lungs. It is normal to cough occasionally, but a cough that happens with other symptoms or lasts a long time may be a sign of a condition that needs treatment. A cough may last only 2-3 weeks (acute), or it may last longer than 8 weeks (chronic). CAUSES Coughing is commonly caused by:  Breathing in substances that irritate your lungs.  A viral or bacterial respiratory infection.  Allergies.  Asthma.  Postnasal drip.  Smoking.  Acid backing up from the stomach into the esophagus (gastroesophageal reflux).  Certain medicines.  Chronic lung problems, including COPD (or rarely, lung cancer).  Other medical conditions such as heart failure. HOME CARE INSTRUCTIONS  Pay attention to any changes in your symptoms. Take these actions to help with your discomfort:  Take medicines only as told by your health care provider.  If you were prescribed an antibiotic medicine, take it as told by your health care provider. Do not stop taking the antibiotic even if you start to feel better.  Talk with your health care provider before you take a cough suppressant medicine.  Drink enough fluid to keep your urine clear or pale yellow.  If the air is dry, use a cold steam vaporizer or humidifier in your bedroom or your home to help loosen secretions.  Avoid anything that causes you to cough at work or at home.  If your cough is worse at night, try sleeping in a semi-upright position.  Avoid cigarette smoke. If you smoke, quit smoking. If you need help  quitting, ask your health care provider.  Avoid caffeine.  Avoid alcohol.  Rest as needed. SEEK MEDICAL CARE IF:   You have new symptoms.  You cough up pus.  Your cough does not get better after 2-3 weeks, or your cough gets worse.  You cannot control your cough with suppressant medicines and you are losing sleep.  You develop pain that is getting worse or pain that is not controlled with pain medicines.  You have a fever.  You have unexplained weight loss.  You have night sweats. SEEK IMMEDIATE MEDICAL CARE IF:  You cough up blood.  You have difficulty breathing.  Your heartbeat is very fast.   This information is not intended to replace advice given to you by your health care provider. Make sure you discuss any questions you have with your health care provider.   Document Released: 01/27/2011 Document Revised: 04/21/2015 Document Reviewed: 10/07/2014 Elsevier Interactive Patient Education 2016 [place] of Breath Shortness of breath means you have trouble breathing. It could also mean that you have a medical problem. You should get immediate medical care for shortness of breath. CAUSES   Not enough oxygen in the air such as with high altitudes or a smoke-filled room.  Certain lung diseases, infections, or problems.  Heart disease or conditions, such as angina or heart failure.  Low red blood cells (anemia).  Poor physical fitness, which can cause shortness of breath when you exercise.  Chest or back injuries or stiffness.  Being overweight.  Smoking.  Anxiety, which can  make you feel like you are not getting enough air. DIAGNOSIS  Serious medical problems can often be found during your physical exam. Tests may also be done to determine why you are having shortness of breath. Tests may include:  Chest X-rays.  Lung function tests.  Blood tests.  An electrocardiogram (ECG).  An ambulatory electrocardiogram. An ambulatory ECG records  your heartbeat patterns over a 24-hour period.  Exercise testing.  A transthoracic echocardiogram (TTE). During echocardiography, sound waves are used to evaluate how blood flows through your heart.  A transesophageal echocardiogram (TEE).  Imaging scans. Your health care provider may not be able to find a cause for your shortness of breath after your exam. In this case, it is important to have a follow-up exam with your health care provider as directed.  TREATMENT  Treatment for shortness of breath depends on the cause of your symptoms and can vary greatly. HOME CARE INSTRUCTIONS   Do not smoke. Smoking is a common cause of shortness of breath. If you smoke, ask for help to quit.  Avoid being around chemicals or things that may bother your breathing, such as paint fumes and dust.  Rest as needed. Slowly resume your usual activities.  If medicines were prescribed, take them as directed for the full length of time directed. This includes oxygen and any inhaled medicines.  Keep all follow-up appointments as directed by your health care provider. SEEK MEDICAL CARE IF:   Your condition does not improve in the time expected.  You have a hard time doing your normal activities even with rest.  You have any new symptoms. SEEK IMMEDIATE MEDICAL CARE IF:   Your shortness of breath gets worse.  You feel light-headed, faint, or develop a cough not controlled with medicines.  You start coughing up blood.  You have pain with breathing.  You have chest pain or pain in your arms, shoulders, or abdomen.  You have a fever.  You are unable to walk up stairs or exercise the way you normally do. MAKE SURE YOU:  Understand these instructions.  Will watch your condition.  Will get help right away if you are not doing well or get worse.   This information is not intended to replace advice given to you by your health care provider. Make sure you discuss any questions you have with your  health care provider.   Document Released: 04/25/2001 Document Revised: 08/05/2013 Document Reviewed: 10/16/2011 Elsevier Interactive Patient Education 2016 [employer].   Emergency Department Resource Guide 1) Find a Doctor and Pay Out of Pocket Although you won't have to find out who is covered by your insurance plan, it is a good idea to ask around and get recommendations. You will then need to call the office and see if the doctor you have chosen will accept you as a new patient and what types of options they offer for patients who are self-pay. Some doctors offer discounts or will set up payment plans for their patients who do not have insurance, but you will need to ask so you aren't surprised when you get to your appointment.  2) Contact Your Local Health Department Not all health departments have doctors that can see patients for sick visits, but many do, so it is worth a call to see if yours does. If you don't know where your local health department is, you can check in your phone book. The CDC also has a tool to help you locate your state's health department,  and many state websites also have listings of all of their local health departments.  3) Find a [facility] If your illness is not likely to be very severe or complicated, you may want to try a walk in clinic. These are popping up all over the country in pharmacies, drugstores, and shopping centers. They're usually staffed by nurse practitioners or physician assistants that have been trained to treat common illnesses and complaints. They're usually fairly quick and inexpensive. However, if you have serious medical issues or chronic medical problems, these are probably not your best option.  No Primary Care Doctor: - Call Health Connect at  [phone] - they can help you locate a primary care doctor that  accepts your insurance, provides certain services, etc. - Physician Referral Service- [phone]  Chronic Pain  Problems: Organization         Address  Phone   Notes  [facility]  [phone] Patients need to be referred by their primary care doctor.   Medication Assistance: Organization         Address  Phone   Notes  [facility] Medication [facility] [address] [place], [place] [address] [phone] --Must be a resident of [facility] -- Must have NO insurance coverage whatsoever (no Medicaid/ Medicare, etc.) -- The pt. MUST have a primary care doctor that directs their care regularly and follows them in the community   Med[facility]ist  [phone]   [employer]  [phone]    Agencies that provide inexpensive medical care: Organization         Address  Phone   Notes  [place]  [phone]   [facility]    [phone]   [facility] [place], [place] [address] [phone]   [place] [address], [place] [phone]   Planned Parenthood    [phone]   [facility]    [phone]   [facility] [place], [name] Phone:  [phone], Fax:  [phone] Hours of Operation:  9 am - 6 pm, M-F.  Also accepts Medicaid/Medicare and self-pay.  [facility] [place], [address], [place] Phone: [phone], Fax: [phone]. Hours of Operation:  8:30 am - 5:30 pm, M-F.  Also accepts Medicaid and self-pay.  [facility] [place] [address] [place] Phone: [phone]   [place], [place], [place] [phone], Ext. [phone] Mondays & Thursdays: 7-9 AM.  First 15 patients are seen on a first come, first serve basis.    [place] Providers:  Organization         Address  Phone   Notes  [facility] [address], [place] [phone] Also  accepts self-pay patients.  [facility] [id number] [place], [place]  [phone]   [name], [address], [place] [phone]   [facility] [address], [place] [phone]   Lucianne Lei [address], [place]   [phone] Only accepts [place] Access [place] patients after they have their name applied to their card.   Self-Pay (no insurance) in [facility]:  Organization  Address  Phone   Notes  Sickle Cell Patients, [facility] [place] [phone]   [facility] [place] [phone]   [facility] [place]  [place], [address], [place] [phone]   [facility]/Dr. Osei-Bonsu  [address], [place] or [address], [place] [phone] Phone number for both [place] and [place] locations is the same.  [facility] [address] [place] [phone]   [facility] [address] [place] or [address] [phone]   [facility] [address], [place] [phone], phone; [phone], fax Sees patients 1st and 3rd Saturday of every month.  Must not qualify for public or private insurance (i.e. Medicaid, Medicare, [place] Health Choice, Veterans' Benefits)  Household income should be no more than 200% of the poverty level The clinic cannot treat you if you are pregnant or think you are pregnant  Sexually transmitted diseases are not treated at the clinic.    Dental Care: Organization         Address  Phone  Notes  [facility] Department of [facility] [place] [phone] Accepts children up to age 23 who are enrolled in [place] or [place]; pregnant  women with a Medicaid card; and children who have applied for Medicaid or [place] Health Choice, but were declined, whose parents can pay a reduced fee at time of service.  [facility] Department of [facility]  [address], [place] [phone] Accepts children up to age 45 who are enrolled in [place] or [place]; pregnant women with a Medicaid card; and children who have applied for Medicaid or [place] Health Choice, but were declined, whose parents can pay a reduced fee at time of service.  [facility] Adult [facility] Access PRO[facility]  [place] [phone] Patients are seen by appointment only. Walk-ins are not accepted. [facility] will see patients 12 years of age and older. Monday - Tuesday (8am-5pm) Most Wednesdays (8:30-5pm) $30 per visit, cash only  [facility] Adult [facility] Access [facility]  [address], [address]  [facility] [phone] Patients are seen by appointment only. Walk-ins are not accepted. [place] will see patients 69 years of age and older. One Wednesday Evening (Monthly: Volunteer Based).  $30 per visit, cash only  [place]  [phone] for adults; Children under age 31, call Graduate Pediatric Dentistry at [phone]. Children aged 22-14, please call [phone] to request a pediatric application.  Dental services are provided in all areas of dental care including fillings, crowns and bridges, complete and partial dentures, implants, gum treatment, root canals, and extractions. Preventive care is also provided. Treatment is provided to both adults and children. Patients are selected via a lottery and there is often a waiting list.   [facility] [address], [place]  [phone] [email or web address]   [facility] [address] [place], [place] [phone], Ext. [phone] Second and Fourth Thursday of each month, opens at 6:30 AM; Clinic ends at 9 AM.  Patients are  seen on a first-come first-served basis, and a limited number are seen during each clinic.   [facility]  [address] [place], [place]  [place], [place] [phone]   Eligibility Requirements You must have lived in [place], [place], or [place] counties for at least the last three months.   You cannot be eligible for state or federal sponsored [employer], including [employer], [place], or [employer].   You generally cannot be eligible for healthcare insurance through your employer.    How to apply: Eligibility screenings are held every Tuesday and Wednesday afternoon from 1:00 pm until 4:00 pm. You do not need an appointment for the interview!  [facility] [address] [place], [place]   [place]  [place] Department  [place]  [phone]    Behavioral Health Resources in the Community: Intensive Outpatient Programs Organization         Address  Phone  Notes  [facility] [place]. [address] [employer], [place] [phone]   [facility] Outpatient [address], [place], [place]   ADS: Alcohol & Drug Svcs [address], [place], [place]   [place] [address]  [place], [place] or [phone]   Substance Abuse Resources Organization         Address  Phone  Notes  Alcohol and Drug Services  [phone]   [place]  [phone]   [facility] [place]   Chinita Pester  [phone]   Residential & Outpatient Substance Abuse Program  [phone]   Psychological Services Organization         Address  Phone  Notes  [facility] [place]  [place]  [phone]   [place] [address], [place] or [phone]    Mobile Crisis  Teams Organization         Address  Phone  Notes  [facility], Mobile Crisis Care Unit  [phone]   [facility]  [address] [place], [place]   [place] [address] [place] [phone]    Self-Help/Support Groups Organization         Address  Phone             Notes  [place]. of [place] - variety of support groups  [place] Call for more information  Narcotics Anonymous (NA), Caring Services [address], [employer] [place]  2 meetings at this location   Special educational needs teacher         Address  Phone  Notes  [facility]AP Residential Treatment [place],    [place]  [phone]   [facility]  [address] [place] [id number], [place], [place]   [place] Johnson, [place] [phone] Admissions: 8am-3pm M-F  Incentives Substance [place] [address]    [place], [place] [phone]   [facility] [address] [place] [address] Jadene Pierini [place], [place]   [facility] [address],  [place], [place]   [facility]ight Programs - Intensive Outpatient [address] Kristeen [address], [place], [place]   [facility] ([place].) [place].,  [place], [place] or [phone]   [facility]idential Treatment Services (RTS) [address], [place], [place] Accepts Medicaid  [facility] [place] [address]  [place] [phone] Substance Abuse/Addiction Treatment   [facility] Resources Organization  Address  Phone  Notes  [facility]  [phone]   Domenic Schwab, PhD [address] Arlis Porta [place], [place]   [phone] or [phone]   [place], [place] [phone]   [address], [place], [place] [phone]  Insurance/Medicaid/sponsorship through [facility] and Families [address], [address] [place]                                    [place], [place] [phone] [place] [address] [place], [place] [phone]    Dr. Adele Schilder  [phone]   [facility] Dept. 1) [address] [place] 2) [place] 3)  [address], [place] [phone]  [phone]   [place] [phone] or [phone] (After Hours)
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
## 2015-07-07 ENCOUNTER — Other Ambulatory Visit: Payer: Self-pay | Admitting: Family Medicine

## 2015-07-15 ENCOUNTER — Other Ambulatory Visit: Payer: Self-pay | Admitting: Family Medicine

## 2015-07-15 ENCOUNTER — Ambulatory Visit (INDEPENDENT_AMBULATORY_CARE_PROVIDER_SITE_OTHER): Payer: Medicare Other | Admitting: Family Medicine

## 2015-07-15 ENCOUNTER — Telehealth: Payer: Self-pay | Admitting: Neurology

## 2015-07-15 ENCOUNTER — Encounter: Payer: Self-pay | Admitting: Family Medicine

## 2015-07-15 VITALS — BP 160/94 | HR 94 | Temp 98.6°F | Resp 18 | Wt 198.2 lb

## 2015-07-15 DIAGNOSIS — Z77120 Contact with and (suspected) exposure to mold (toxic): Secondary | ICD-10-CM

## 2015-07-15 DIAGNOSIS — M25579 Pain in unspecified ankle and joints of unspecified foot: Secondary | ICD-10-CM

## 2015-07-15 DIAGNOSIS — R5383 Other fatigue: Secondary | ICD-10-CM | POA: Diagnosis not present

## 2015-07-15 DIAGNOSIS — Z202 Contact with and (suspected) exposure to infections with a predominantly sexual mode of transmission: Secondary | ICD-10-CM | POA: Diagnosis not present

## 2015-07-15 DIAGNOSIS — D509 Iron deficiency anemia, unspecified: Secondary | ICD-10-CM | POA: Diagnosis not present

## 2015-07-15 DIAGNOSIS — E611 Iron deficiency: Secondary | ICD-10-CM

## 2015-07-15 DIAGNOSIS — E876 Hypokalemia: Secondary | ICD-10-CM | POA: Diagnosis not present

## 2015-07-15 MED ORDER — LORCASERIN HCL 10 MG PO TABS: 1.0000 | ORAL_TABLET | Freq: Two times a day (BID) | ORAL | Status: DC

## 2015-07-15 MED ORDER — AMLODIPINE BESYLATE 2.5 MG PO TABS: 2.5000 mg | ORAL_TABLET | Freq: Every day | ORAL | Status: DC

## 2015-07-15 MED ORDER — CLOBETASOL PROPIONATE 0.05 % EX CREA: 1.0000 "application " | TOPICAL_CREAM | Freq: Every day | CUTANEOUS | Status: DC

## 2015-07-15 NOTE — Progress Notes (Signed)
   Subjective:    Patient ID: Jennifer [name], female    DOB: September 04, 1965, 49 y.o.   MRN: [id number]  HPI She finally moved to her new apartment.  She is not super happy with the place but has it set up.  She is back into her routine.  She has been walking stairs some.  hasn't been able to get back to gym bc having some dizziness episodes.    Started feeling dizzy the other day but was under some stress that day turning in her old keys for her old apartment.  Hasn't seen Dr. Berdine Addison in awhile.  Home BPs have been good.  See had mold damage in the old apartment. She does fee like she has breathing much better in her new place.    She is still havings a lot of nasal congestion and drianage. She never took the antibiotic.  No fever, chillls or sweats.    She wants to be tested for aspergillus. She had mold exposure in her old apartment for over a year.     Review of Systems     Objective:   Physical Exam  Constitutional: She is oriented to person, place, and time. She appears well-developed and well-nourished.  HENT:  Head: Normocephalic and atraumatic.  Right Ear: External ear normal.  Left Ear: External ear normal.  Nose: Nose normal.  Mouth/Throat: Oropharynx is clear and moist.  TMs and canals are clear.   Eyes: Conjunctivae and EOM are normal. Pupils are equal, round, and reactive to light.  Neck: Neck supple. No thyromegaly present.  Cardiovascular: Normal rate, regular rhythm and normal heart sounds.   Pulmonary/Chest: Effort normal and breath sounds normal. She has no wheezes.  Lymphadenopathy:    She has no cervical adenopathy.  Neurological: She is alert and oriented to person, place, and time.  Skin: Skin is warm and dry.  Psychiatric: She has a normal mood and affect.          Assessment & Plan:  Mold exposure-will test for Aspergillus. The she is now out of that environment so hopefully she'll start feeling better and loose as far as her chronic congestion and  postnasal drip. Next  Allergic rhinitis-continue with routine care. I still do not recommend any course of antibiotic set this point. It was anything that looks like an acute bacterial infection.  Dizziness-exam is normal today except her blood pressures mildly elevated. Did encourage her to consider restarting her amlodipine. She's gone on off of it several times. I do think that her dizziness is oftentimes triggered by stress.

## 2015-07-15 NOTE — Telephone Encounter (Signed)
We can do a sleep study here and take it from there. I have previously reviewed her HSTs from 4/15. She will likely not qualify for BiPAP, but we can try CPAP if indicated and if she qualifies.  Pls call patient back to advise.

## 2015-07-15 NOTE — Telephone Encounter (Signed)
I spoke to patient and she is aware that we will order sleep study here. She is aware of information below.

## 2015-07-15 NOTE — Telephone Encounter (Signed)
Study is scanned in under Media dated 11/25/13 and 11/26/13.

## 2015-07-15 NOTE — Telephone Encounter (Signed)
Pt called sts records were to be rec'd from [facility] and [facility] for Dr Rexene Alberts to review and to discuss with pt regarding sleep study. Please call and advise

## 2015-07-16 ENCOUNTER — Encounter: Payer: Self-pay | Admitting: Family Medicine

## 2015-07-16 ENCOUNTER — Telehealth: Payer: Self-pay | Admitting: Family Medicine

## 2015-07-16 DIAGNOSIS — F329 Major depressive disorder, single episode, unspecified: Secondary | ICD-10-CM | POA: Diagnosis not present

## 2015-07-16 DIAGNOSIS — T7840XA Allergy, unspecified, initial encounter: Secondary | ICD-10-CM | POA: Diagnosis not present

## 2015-07-16 DIAGNOSIS — F4310 Post-traumatic stress disorder, unspecified: Secondary | ICD-10-CM | POA: Diagnosis not present

## 2015-07-16 DIAGNOSIS — J45909 Unspecified asthma, uncomplicated: Secondary | ICD-10-CM | POA: Diagnosis not present

## 2015-07-16 DIAGNOSIS — Z79899 Other long term (current) drug therapy: Secondary | ICD-10-CM | POA: Diagnosis not present

## 2015-07-16 DIAGNOSIS — G35 Multiple sclerosis: Secondary | ICD-10-CM | POA: Diagnosis not present

## 2015-07-16 DIAGNOSIS — I10 Essential (primary) hypertension: Secondary | ICD-10-CM | POA: Diagnosis not present

## 2015-07-16 DIAGNOSIS — E876 Hypokalemia: Secondary | ICD-10-CM

## 2015-07-16 LAB — KOH PREP: RESULT - KOH: NONE SEEN

## 2015-07-16 NOTE — Telephone Encounter (Signed)
Call patient: Her ferritin level looks good. Total iron is still on the low end. Electrolytes and kidney function are normal and blood count is normal. Hemoglobin looks good at 13.9.

## 2015-07-19 ENCOUNTER — Emergency Department (HOSPITAL_BASED_OUTPATIENT_CLINIC_OR_DEPARTMENT_OTHER): Payer: Medicare Other

## 2015-07-19 ENCOUNTER — Emergency Department (HOSPITAL_BASED_OUTPATIENT_CLINIC_OR_DEPARTMENT_OTHER)
Admission: EM | Admit: 2015-07-19 | Discharge: 2015-07-19 | Disposition: A | Discharge status: Home / Self Care | Payer: Medicare Other | Attending: Emergency Medicine | Admitting: Emergency Medicine

## 2015-07-19 ENCOUNTER — Telehealth: Payer: Self-pay

## 2015-07-19 ENCOUNTER — Encounter (HOSPITAL_BASED_OUTPATIENT_CLINIC_OR_DEPARTMENT_OTHER): Payer: Self-pay | Admitting: *Deleted

## 2015-07-19 DIAGNOSIS — K219 Gastro-esophageal reflux disease without esophagitis: Secondary | ICD-10-CM | POA: Insufficient documentation

## 2015-07-19 DIAGNOSIS — Z862 Personal history of diseases of the blood and blood-forming organs and certain disorders involving the immune mechanism: Secondary | ICD-10-CM | POA: Diagnosis not present

## 2015-07-19 DIAGNOSIS — Z8614 Personal history of Methicillin resistant Staphylococcus aureus infection: Secondary | ICD-10-CM | POA: Diagnosis not present

## 2015-07-19 DIAGNOSIS — G8929 Other chronic pain: Secondary | ICD-10-CM | POA: Diagnosis not present

## 2015-07-19 DIAGNOSIS — Z8659 Personal history of other mental and behavioral disorders: Secondary | ICD-10-CM | POA: Diagnosis not present

## 2015-07-19 DIAGNOSIS — Z8541 Personal history of malignant neoplasm of cervix uteri: Secondary | ICD-10-CM | POA: Insufficient documentation

## 2015-07-19 DIAGNOSIS — I10 Essential (primary) hypertension: Secondary | ICD-10-CM | POA: Diagnosis not present

## 2015-07-19 DIAGNOSIS — Z87891 Personal history of nicotine dependence: Secondary | ICD-10-CM | POA: Insufficient documentation

## 2015-07-19 DIAGNOSIS — Z880 Allergy status to penicillin: Secondary | ICD-10-CM | POA: Diagnosis not present

## 2015-07-19 DIAGNOSIS — Z79899 Other long term (current) drug therapy: Secondary | ICD-10-CM | POA: Diagnosis not present

## 2015-07-19 DIAGNOSIS — R000 Tachycardia, unspecified: Secondary | ICD-10-CM | POA: Insufficient documentation

## 2015-07-19 DIAGNOSIS — Z7952 Long term (current) use of systemic steroids: Secondary | ICD-10-CM | POA: Diagnosis not present

## 2015-07-19 DIAGNOSIS — G4700 Insomnia, unspecified: Secondary | ICD-10-CM | POA: Diagnosis not present

## 2015-07-19 DIAGNOSIS — M7989 Other specified soft tissue disorders: Secondary | ICD-10-CM | POA: Diagnosis not present

## 2015-07-19 DIAGNOSIS — R0789 Other chest pain: Secondary | ICD-10-CM

## 2015-07-19 DIAGNOSIS — M79651 Pain in right thigh: Secondary | ICD-10-CM

## 2015-07-19 DIAGNOSIS — M791 Myalgia, unspecified site: Secondary | ICD-10-CM

## 2015-07-19 DIAGNOSIS — Z7951 Long term (current) use of inhaled steroids: Secondary | ICD-10-CM | POA: Insufficient documentation

## 2015-07-19 DIAGNOSIS — J45909 Unspecified asthma, uncomplicated: Secondary | ICD-10-CM | POA: Insufficient documentation

## 2015-07-19 DIAGNOSIS — R52 Pain, unspecified: Secondary | ICD-10-CM | POA: Diagnosis present

## 2015-07-19 NOTE — Telephone Encounter (Signed)
Left detailed message advising of recommendations.  

## 2015-07-19 NOTE — Telephone Encounter (Signed)
1-Jennifer [name] called and reports a "funky feeling" at night and believes it is her blood pressure elevated. She does not check her blood pressure at night. She checked her blood pressure this morning, before taking the amlodipine, and the reading was 137/92. She thinks she should be on blood pressure medication twice a day. She states she feels better about an hour after taking the amlodipine.  2-Two days ago she was cleaning her panty on her knees and when she stood up her knee popped and now she has a pain. The pain is not bad enough for pain medication.

## 2015-07-19 NOTE — Discharge Instructions (Signed)
Please follow up with your doctor for further management of your symptoms.  You do not have any evidence of blood clot in your leg today.  However, you will need to have a repeat ultrasound of your right leg in 5-7 weeks.  Please discuss this with your doctor.

## 2015-07-19 NOTE — ED Notes (Signed)
Body aches. Sob and pain in her right leg and swelling in her knee.

## 2015-07-19 NOTE — ED Notes (Signed)
Pt here frequently with same complaints. Pt was here 11/22 complaining of SOB has CTA that was negative.  Pt complains of sob and swelling to right leg.  Pt also complains of generalized body aches.

## 2015-07-19 NOTE — Telephone Encounter (Signed)
She is definitely on a low dose of amlodipine so she would like to take one in the morning and 1 in the evening she certainly can. Or she can even try taking half of a tab twice a day. As far as the knee is concerned recommend icing and resting it and if it's not improving over the next few days then I'm happy to see her for her knee.
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
## 2015-07-19 NOTE — ED Notes (Signed)
Pt requesting medication for pain.  Ok'd to take own chewable tylenol.  Pt updated on delay in care and testing.

## 2015-07-19 NOTE — ED Notes (Signed)
Pt transported to [place]. Pt refused to put on gown to go to [place] reports it makes her itch.

## 2015-07-20 ENCOUNTER — Ambulatory Visit (INDEPENDENT_AMBULATORY_CARE_PROVIDER_SITE_OTHER): Payer: Medicare Other | Admitting: Family Medicine

## 2015-07-20 ENCOUNTER — Ambulatory Visit (HOSPITAL_BASED_OUTPATIENT_CLINIC_OR_DEPARTMENT_OTHER)
Admission: RE | Admit: 2015-07-20 | Discharge: 2015-07-20 | Disposition: A | Discharge status: Home / Self Care | Payer: Medicare Other | Source: Ambulatory Visit | Attending: Family Medicine | Admitting: Family Medicine

## 2015-07-20 ENCOUNTER — Encounter: Payer: Self-pay | Admitting: Family Medicine

## 2015-07-20 VITALS — BP 137/84 | HR 80 | Wt 199.0 lb

## 2015-07-20 DIAGNOSIS — R531 Weakness: Secondary | ICD-10-CM

## 2015-07-20 DIAGNOSIS — E041 Nontoxic single thyroid nodule: Secondary | ICD-10-CM | POA: Diagnosis not present

## 2015-07-20 DIAGNOSIS — R221 Localized swelling, mass and lump, neck: Secondary | ICD-10-CM | POA: Insufficient documentation

## 2015-07-20 DIAGNOSIS — D509 Iron deficiency anemia, unspecified: Secondary | ICD-10-CM | POA: Diagnosis not present

## 2015-07-20 DIAGNOSIS — E079 Disorder of thyroid, unspecified: Secondary | ICD-10-CM | POA: Diagnosis not present

## 2015-07-20 DIAGNOSIS — R5383 Other fatigue: Secondary | ICD-10-CM | POA: Diagnosis not present

## 2015-07-20 DIAGNOSIS — Z77120 Contact with and (suspected) exposure to mold (toxic): Secondary | ICD-10-CM | POA: Diagnosis not present

## 2015-07-20 DIAGNOSIS — Z202 Contact with and (suspected) exposure to infections with a predominantly sexual mode of transmission: Secondary | ICD-10-CM | POA: Diagnosis not present
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
## 2015-07-21 ENCOUNTER — Ambulatory Visit: Payer: Self-pay | Admitting: Family Medicine

## 2015-07-21 ENCOUNTER — Telehealth: Payer: Self-pay | Admitting: Family Medicine

## 2015-07-21 NOTE — Telephone Encounter (Signed)
Left detailed message on patient vm with results as noted below. Cosandra Plouffe,CMA  

## 2015-07-21 NOTE — Telephone Encounter (Signed)
Left detailed message on patient vm with results as noted below. [name],CMA  

## 2015-07-21 NOTE — Telephone Encounter (Signed)
Please call patient: Metabolic panel labs look normal. Ferritin is okay. Negative for hepatitis C. And blood count looks normal. White blood cells are stable and no sign of acute infection.

## 2015-07-22 DIAGNOSIS — D509 Iron deficiency anemia, unspecified: Secondary | ICD-10-CM | POA: Diagnosis not present

## 2015-07-23 ENCOUNTER — Telehealth: Payer: Self-pay

## 2015-07-23 DIAGNOSIS — Z79899 Other long term (current) drug therapy: Secondary | ICD-10-CM | POA: Diagnosis not present

## 2015-07-23 DIAGNOSIS — J028 Acute pharyngitis due to other specified organisms: Secondary | ICD-10-CM | POA: Diagnosis not present

## 2015-07-23 DIAGNOSIS — G35 Multiple sclerosis: Secondary | ICD-10-CM | POA: Diagnosis not present

## 2015-07-23 DIAGNOSIS — G8929 Other chronic pain: Secondary | ICD-10-CM | POA: Diagnosis not present

## 2015-07-23 DIAGNOSIS — J45909 Unspecified asthma, uncomplicated: Secondary | ICD-10-CM | POA: Diagnosis not present

## 2015-07-23 DIAGNOSIS — B9789 Other viral agents as the cause of diseases classified elsewhere: Secondary | ICD-10-CM | POA: Diagnosis not present

## 2015-07-23 DIAGNOSIS — I10 Essential (primary) hypertension: Secondary | ICD-10-CM | POA: Diagnosis not present

## 2015-07-23 DIAGNOSIS — F329 Major depressive disorder, single episode, unspecified: Secondary | ICD-10-CM | POA: Diagnosis not present

## 2015-07-23 DIAGNOSIS — F4310 Post-traumatic stress disorder, unspecified: Secondary | ICD-10-CM | POA: Diagnosis not present

## 2015-07-23 DIAGNOSIS — Z87891 Personal history of nicotine dependence: Secondary | ICD-10-CM | POA: Diagnosis not present

## 2015-07-23 NOTE — Telephone Encounter (Signed)
The following are Pt's results from [facility] on 07/20/15:    CBC w/ Differential (07/20/2015 1:30 PM) Only the most recent of 6 results within the time period is included.  CBC w/ Differential (07/20/2015 1:30 PM)  Component Value Range  WBC 6.8 4.0-10.5 10*9/L  RBC 4.64 3.87-5.11 10*12/L  HGB 14.2 12.0-15.0 g/dL  HCT 42.4 36.0-46.0 %  MCV 91.4 78.0-100.0 fL  MCH 30.6 26.0-34.0 pg  MCHC 33.5 30.0-36.0 g/dL  RDW 14.5 11.5-15.5 %  MPV 10.5 8.8-12.7 fL  Platelet 211 150-400 10*9/L  Neutrophils % 65.6 %  Lymphocytes % 25.0 %  Monocytes % 7.4 %  Eosinophils % 1.6 %  Basophils % 0.4 %  Absolute Neutrophils 4.4 1.7-7.7 10*9/L  Absolute Lymphocytes 1.7 0.7-4.0 10*9/L  Absolute Monocytes 0.5 0.1-1.0 10*9/L  Absolute Eosinophils 0.1 0.0-0.7 10*9/L  Absolute Basophils 0.0 0.0-0.1 10*9/L   CBC w/ Differential (07/20/2015 1:30 PM)  Specimen  Blood  Back to top of Results from Last 3 Months   Sedimentation Rate (07/20/2015 1:30 PM) Only the most recent of 2 results within the time period is included.  Sedimentation Rate (07/20/2015 1:30 PM)  Component Value Range  Sed Rate 31 (H) 0-22 mm/h   Sedimentation Rate (07/20/2015 1:30 PM)  Specimen  Blood  Back to top of Results from Last 3 Months   CBC w/ Differential (07/20/2015 1:30 PM) Only the most recent of 6 results within the time period is included.  CBC w/ Differential (07/20/2015 1:30 PM)  Specimen  Blood   CBC w/ Differential (07/20/2015 1:30 PM)  Narrative  The following orders were created for panel order CBC w/ Differential.  Procedure Abnormality Status   --------- ----------- ------   CBC w/ [id number]] Final result     Please view results for these tests on the individual orders.

## 2015-07-23 NOTE — Telephone Encounter (Signed)
Patient states her glands feel worse today. She now has hoarse voice. Please advise.

## 2015-07-26 ENCOUNTER — Other Ambulatory Visit: Payer: Self-pay

## 2015-07-26 ENCOUNTER — Telehealth: Payer: Self-pay | Admitting: Neurology

## 2015-07-26 DIAGNOSIS — G4733 Obstructive sleep apnea (adult) (pediatric): Secondary | ICD-10-CM

## 2015-07-26 NOTE — Telephone Encounter (Signed)
I see the order under procedures, are you able to see that?

## 2015-07-26 NOTE — Telephone Encounter (Signed)
I didn't get her message until Friday evening.  Has she finished her prednisone.  As long as she can still eat and swallow may have to give it time to allow the swelling to go down.

## 2015-07-26 NOTE — Telephone Encounter (Signed)
Spoke with patient.  She finished the prednisone a couple of days ago.  Still not feeling good.  Complained of both legs hurting as well as back.  Seeing GI on Wednesday because of swallowing issues

## 2015-07-26 NOTE — Telephone Encounter (Signed)
Patient called wanting to schedule her sleep study but I don't see an order.  Could I get an order?  Please Skype me when the order is in.

## 2015-07-26 NOTE — Telephone Encounter (Signed)
error 

## 2015-07-27 NOTE — Telephone Encounter (Signed)
For now I would say just try to rest and stay really well hydrated. The Foley make sure avoiding any type of milk products especially with the throat feeling swollen. And keep appointment on Wednesday with GI.

## 2015-07-27 NOTE — Telephone Encounter (Signed)
Called patient regarding rest and hydration.  Is seeing GI tomorrow.  Will call if problems arise.

## 2015-07-29 ENCOUNTER — Emergency Department (HOSPITAL_BASED_OUTPATIENT_CLINIC_OR_DEPARTMENT_OTHER)
Admission: EM | Admit: 2015-07-29 | Discharge: 2015-07-29 | Disposition: A | Discharge status: Home / Self Care | Payer: Medicare Other | Attending: Emergency Medicine | Admitting: Emergency Medicine

## 2015-07-29 ENCOUNTER — Encounter (HOSPITAL_BASED_OUTPATIENT_CLINIC_OR_DEPARTMENT_OTHER): Payer: Self-pay | Admitting: *Deleted

## 2015-07-29 ENCOUNTER — Emergency Department (HOSPITAL_BASED_OUTPATIENT_CLINIC_OR_DEPARTMENT_OTHER): Payer: Medicare Other

## 2015-07-29 ENCOUNTER — Telehealth: Payer: Self-pay | Admitting: Family Medicine

## 2015-07-29 DIAGNOSIS — G4730 Sleep apnea, unspecified: Secondary | ICD-10-CM | POA: Diagnosis not present

## 2015-07-29 DIAGNOSIS — Z7951 Long term (current) use of inhaled steroids: Secondary | ICD-10-CM | POA: Insufficient documentation

## 2015-07-29 DIAGNOSIS — F329 Major depressive disorder, single episode, unspecified: Secondary | ICD-10-CM | POA: Diagnosis not present

## 2015-07-29 DIAGNOSIS — Z8739 Personal history of other diseases of the musculoskeletal system and connective tissue: Secondary | ICD-10-CM | POA: Diagnosis not present

## 2015-07-29 DIAGNOSIS — R05 Cough: Secondary | ICD-10-CM | POA: Diagnosis not present

## 2015-07-29 DIAGNOSIS — G8929 Other chronic pain: Secondary | ICD-10-CM | POA: Insufficient documentation

## 2015-07-29 DIAGNOSIS — Z880 Allergy status to penicillin: Secondary | ICD-10-CM | POA: Insufficient documentation

## 2015-07-29 DIAGNOSIS — Z8614 Personal history of Methicillin resistant Staphylococcus aureus infection: Secondary | ICD-10-CM | POA: Diagnosis not present

## 2015-07-29 DIAGNOSIS — Z87891 Personal history of nicotine dependence: Secondary | ICD-10-CM | POA: Diagnosis not present

## 2015-07-29 DIAGNOSIS — Z862 Personal history of diseases of the blood and blood-forming organs and certain disorders involving the immune mechanism: Secondary | ICD-10-CM | POA: Diagnosis not present

## 2015-07-29 DIAGNOSIS — R070 Pain in throat: Secondary | ICD-10-CM | POA: Insufficient documentation

## 2015-07-29 DIAGNOSIS — R490 Dysphonia: Secondary | ICD-10-CM | POA: Insufficient documentation

## 2015-07-29 DIAGNOSIS — R040 Epistaxis: Secondary | ICD-10-CM | POA: Diagnosis not present

## 2015-07-29 DIAGNOSIS — Z8639 Personal history of other endocrine, nutritional and metabolic disease: Secondary | ICD-10-CM | POA: Insufficient documentation

## 2015-07-29 DIAGNOSIS — J45909 Unspecified asthma, uncomplicated: Secondary | ICD-10-CM | POA: Diagnosis not present

## 2015-07-29 DIAGNOSIS — K219 Gastro-esophageal reflux disease without esophagitis: Secondary | ICD-10-CM | POA: Diagnosis not present

## 2015-07-29 DIAGNOSIS — Z8542 Personal history of malignant neoplasm of other parts of uterus: Secondary | ICD-10-CM | POA: Insufficient documentation

## 2015-07-29 DIAGNOSIS — I10 Essential (primary) hypertension: Secondary | ICD-10-CM | POA: Diagnosis not present

## 2015-07-29 DIAGNOSIS — J069 Acute upper respiratory infection, unspecified: Secondary | ICD-10-CM | POA: Diagnosis not present

## 2015-07-29 DIAGNOSIS — Z79899 Other long term (current) drug therapy: Secondary | ICD-10-CM | POA: Diagnosis not present

## 2015-07-29 DIAGNOSIS — M542 Cervicalgia: Secondary | ICD-10-CM | POA: Diagnosis present

## 2015-07-29 DIAGNOSIS — R110 Nausea: Secondary | ICD-10-CM | POA: Diagnosis not present

## 2015-07-29 DIAGNOSIS — G35 Multiple sclerosis: Secondary | ICD-10-CM | POA: Diagnosis not present

## 2015-07-29 DIAGNOSIS — K589 Irritable bowel syndrome without diarrhea: Secondary | ICD-10-CM | POA: Diagnosis not present

## 2015-07-29 LAB — BASIC METABOLIC PANEL
Anion gap: 8 (ref 5–15)
BUN: 13 mg/dL (ref 6–20)
CO2: 24 mmol/L (ref 22–32)
Calcium: 8.8 mg/dL — ABNORMAL LOW (ref 8.9–10.3)
Chloride: 109 mmol/L (ref 101–111)
Creatinine, Ser: 0.57 mg/dL (ref 0.44–1.00)
GFR calc Af Amer: >60 mL/min (ref >60)
GFR calc non Af Amer: >60 mL/min (ref >60)
Glucose, Bld: 165 mg/dL — ABNORMAL HIGH (ref 65–99)
Potassium: 3.3 mmol/L — ABNORMAL LOW (ref 3.5–5.1)
Sodium: 141 mmol/L (ref 135–145)

## 2015-07-29 LAB — CBC WITH DIFFERENTIAL/PLATELET
Basophils Absolute: 0 10*3/uL (ref 0.0–0.1)
Basophils Relative: 0 %
Eosinophils Absolute: 0.1 10*3/uL (ref 0.0–0.7)
Eosinophils Relative: 2 %
HCT: 43.1 % (ref 36.0–46.0)
Hemoglobin: 14 g/dL (ref 12.0–15.0)
Lymphocytes Relative: 20 %
Lymphs Abs: 1.3 10*3/uL (ref 0.7–4.0)
MCH: 29.7 pg (ref 26.0–34.0)
MCHC: 32.5 g/dL (ref 30.0–36.0)
MCV: 91.5 fL (ref 78.0–100.0)
Monocytes Absolute: 0.3 10*3/uL (ref 0.1–1.0)
Monocytes Relative: 5 %
Neutro Abs: 4.6 10*3/uL (ref 1.7–7.7)
Neutrophils Relative %: 73 %
Platelets: 200 10*3/uL (ref 150–400)
RBC: 4.71 MIL/uL (ref 3.87–5.11)
RDW: 14.6 % (ref 11.5–15.5)
WBC: 6.3 10*3/uL (ref 4.0–10.5)

## 2015-07-29 LAB — MONONUCLEOSIS SCREEN: Mono Screen: NEGATIVE

## 2015-07-29 LAB — RAPID STREP SCREEN (MED CTR MEBANE ONLY): Streptococcus, Group A Screen (Direct): NEGATIVE

## 2015-07-29 MED ORDER — ONDANSETRON 4 MG PO TBDP
4.0000 mg | ORAL_TABLET | Freq: Once | ORAL | Status: AC
  Administered 2015-07-29: 4 mg via ORAL
  Filled 2015-07-29: qty 1

## 2015-07-29 NOTE — Telephone Encounter (Signed)
Left msg for pt to call back

## 2015-07-29 NOTE — ED Notes (Signed)
Neck pain. Woke with hoarseness. She has had swelling in her neck for several weeks. Her MD did an [place] of her neck and did not see any abnormalities.

## 2015-07-29 NOTE — Telephone Encounter (Addendum)
Please call patient: The test for Aspergillus antigen was negative. Antibodies to the thyroid were negative. Thus no sign of Hashimoto's. And negative for herpes simplex 1 and herpes simplex 2.
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
## 2015-07-29 NOTE — ED Provider Notes (Signed)
Medical screening examination/treatment/procedure(s) were conducted as a shared visit with non-physician practitioner(s) and myself.  I personally evaluated the patient during the encounter.   EKG Interpretation None       I have personally seen and examined this patient. She is complaining of worsening voice as well as trouble breathing for 3 weeks, a feeling of a foreign body in her throat such as mucus, and now worsening cough and bloody sinus drainage. She has no signs arrest for distress and is speaking in complete and very long sentences without any signs of increased work of breathing. Her oxygen saturations 100%. She has no stridor or wheezing. She is complaining of chest tightness that is likely related to her coughing. Chest x-ray is unremarkable. She does have a history of vocal cord dysfunction while this may be playing a role she's not have any risk for distress, stridor, or other emergent signs would indicate eat for emergent ENT consult, airway management, or other acute treatment. I think she most likely has viral symptoms with her new drainage as well as a large anxiety component. She was seen at [facility] just this morning but states they didn't do anything for her except refer her to ENT. I strongly encouraged to keep this ENT follow-up. Given no distress and benign workup, she is stable for discharge with return precautions.  Sherwood Gambler, MD 07/29/15 [phone]

## 2015-07-29 NOTE — Discharge Instructions (Signed)
Jennifer [name],  Nice meeting you! Please follow-up with your ENT doctor. Return to the emergency department if you develop fevers, increasing pain, have trouble swallowing or breathing. Feel better soon!  S. Wendie Simmer, PA-C   Hoarseness Hoarseness is any abnormal change in your voice.Hoarseness can make it difficult to speak. Your voice may sound raspy, breathy, or strained. Hoarseness is caused by a problem with the vocal cords. The vocal cords are two bands of tissue inside your voice box (larynx). When you speak, your vocal cords move back and forth to create sound. The surfaces of your vocal cords need to be smooth for your voice to sound clear. Swelling or lumps on the vocal cords can cause hoarseness. Common causes of vocal cord problems include:  Upper airway infection.  A long-term cough.  Straining or overusing your voice.  Smoking.  Allergies.  Vocal cord growths.  Stomach acids that flow up from your stomach and irritate your vocal cords (gastroesophageal reflux). HOME CARE INSTRUCTIONS Watch your condition for any changes. To ease any discomfort that you feel:  Rest your voice. Do not whisper. Whispering can cause muscle strain.  Do not speak in a loud or harsh voice that makes your hoarseness worse.  Do not use any tobacco products, including cigarettes, chewing tobacco, or electronic cigarettes. If you need help quitting, ask your health care provider.  Avoid secondhand smoke.  Do not eat foods that give you heartburn. Heartburn can make gastroesophageal reflux worse.  Do not drink coffee.  Do not drink alcohol.  Drink enough fluids to keep your urine clear or pale yellow.  Use a humidifier if the air in your home is dry. SEEK MEDICAL CARE IF:  You have hoarseness that lasts longer than 3 weeks.  You almost lose or completelylose your voice for longer than 3 days.  You have pain when you swallow or try to talk.  You feel a lump in your  neck. SEEK IMMEDIATE MEDICAL CARE IF:  You have trouble swallowing.  You feel as though you are choking when you swallow.  You cough up blood or vomit blood.  You have trouble breathing.   This information is not intended to replace advice given to you by your health care provider. Make sure you discuss any questions you have with your health care provider.   Document Released: 07/14/2005 Document Revised: 12/15/2014 Document Reviewed: 07/22/2014 Elsevier Interactive Patient Education [employer].

## 2015-07-30 ENCOUNTER — Telehealth: Payer: Self-pay

## 2015-07-30 ENCOUNTER — Encounter: Payer: Self-pay | Admitting: Family Medicine

## 2015-07-30 ENCOUNTER — Ambulatory Visit (INDEPENDENT_AMBULATORY_CARE_PROVIDER_SITE_OTHER): Payer: Medicare Other | Admitting: Internal Medicine

## 2015-07-30 ENCOUNTER — Encounter: Payer: Self-pay | Admitting: Internal Medicine

## 2015-07-30 ENCOUNTER — Ambulatory Visit (INDEPENDENT_AMBULATORY_CARE_PROVIDER_SITE_OTHER): Payer: Medicare Other | Admitting: Family Medicine

## 2015-07-30 VITALS — BP 144/82 | HR 98 | Temp 98.0°F | Ht 62.0 in | Wt 200.0 lb

## 2015-07-30 VITALS — BP 142/94 | HR 98 | Temp 98.9°F | Resp 20 | Ht 62.0 in | Wt 199.4 lb

## 2015-07-30 DIAGNOSIS — J019 Acute sinusitis, unspecified: Secondary | ICD-10-CM | POA: Diagnosis not present

## 2015-07-30 DIAGNOSIS — I10 Essential (primary) hypertension: Secondary | ICD-10-CM

## 2015-07-30 DIAGNOSIS — J4521 Mild intermittent asthma with (acute) exacerbation: Secondary | ICD-10-CM | POA: Diagnosis not present

## 2015-07-30 DIAGNOSIS — J4520 Mild intermittent asthma, uncomplicated: Secondary | ICD-10-CM | POA: Insufficient documentation

## 2015-07-30 DIAGNOSIS — K2100 Gastro-esophageal reflux disease with esophagitis, without bleeding: Secondary | ICD-10-CM

## 2015-07-30 DIAGNOSIS — J3089 Other allergic rhinitis: Secondary | ICD-10-CM | POA: Diagnosis not present

## 2015-07-30 LAB — PULMONARY FUNCTION TEST

## 2015-07-30 MED ORDER — CLINDAMYCIN HCL 75 MG PO CAPS: 75.0000 mg | ORAL_CAPSULE | Freq: Three times a day (TID) | ORAL | Status: DC

## 2015-07-30 MED ORDER — SPIRONOLACTONE 25 MG PO TABS: 25.0000 mg | ORAL_TABLET | Freq: Every day | ORAL | Status: DC

## 2015-07-30 NOTE — Progress Notes (Signed)
History of Present Illness: Jennifer [name] is a 49 y.o. female presented for a sick visit  HPI Comments: Asthma with vocal cord dysfunction: For the past week, patient has been having shortness of breath associated with a possible sinus infection. A few weeks ago, she took Decadron for shortness of breath but did not feel that it helped.  Allergic rhinitis: Skin testing in the past was positive for mold. She has moved to a new home that does not have mold recently. For the past week she has been having nasal drainage sneezing hoarseness. She was seen by her primary care physician today and prescribed clindamycin.  Adverse reactions to foods: She has increased mucus with large amounts of dairy. She had an equivocal IgE in the past.   Assessment and Plan: Other allergic rhinitis  With current sinusitis  Take clindamycin per PCP  Continue Nasonex 2 sprays each nostril daily. Use nasal saline lavage prior to nasal spray  Asthma  Intermittent, with vocal cord dysfunction  Continue Xopenex as needed    Return in about 6 months (around 01/28/2016).  Medications ordered this encounter:  No orders of the defined types were placed in this encounter.    Diagnostics: Spirometry: FEV1 2.52L or 96%, FEV1/FVC  92%.  This is a normal study.  Physical Exam: BP 142/94 mmHg  Pulse 98  Temp(Src) 98.9 F (37.2 C) (Oral)  Resp 20  Ht 5\' 2"  (1.575 m)  Wt 199 lb 6.4 oz (90.447 kg)  BMI 36.46 kg/m2  LMP 06/25/2013   Physical Exam  Constitutional: She appears well-developed and well-nourished. No distress.  HENT:  Right Ear: External ear normal.  Left Ear: External ear normal.  Nose: Nose normal.  Mouth/Throat: Oropharynx is clear and moist.  Eyes: Conjunctivae are normal. Right eye exhibits no discharge. Left eye exhibits no discharge.  Cardiovascular: Normal rate, regular rhythm and normal heart sounds.   No murmur heard. Pulmonary/Chest: Effort normal and breath sounds normal. No  respiratory distress. She has no wheezes. She has no rales.  Abdominal: Soft. Bowel sounds are normal.  Musculoskeletal: She exhibits no edema.  Lymphadenopathy:    She has no cervical adenopathy.  Neurological: She is alert.  Skin: No rash noted.  Vitals reviewed.   Medications: Current outpatient prescriptions:  .  acetaminophen (TYLENOL) 500 MG tablet, Take 1 tablet (500 mg total) by mouth every 6 (six) hours as needed., Disp: 30 tablet, Rfl: 0 .  amLODipine (NORVASC) 2.5 MG tablet, Take 1 tablet (2.5 mg total) by mouth daily., Disp: 90 tablet, Rfl: 1 .  clindamycin (CLEOCIN) 75 MG capsule, Take 1 capsule (75 mg total) by mouth 3 (three) times daily., Disp: 30 capsule, Rfl: 0 .  clobetasol cream (TEMOVATE) [id number] %, Apply 1 application topically daily., Disp: 60 g, Rfl: 0 .  EPINEPHrine (EPIPEN 2-PAK) 0.3 mg/0.3 mL SOAJ injection, Inject 0.3 mg into the muscle as needed (allergic reaction). , Disp: , Rfl:  .  fluticasone (FLONASE) 50 MCG/ACT nasal spray, Place 1 spray into the nose., Disp: , Rfl:  .  lansoprazole (PREVACID SOLUTAB) 30 MG disintegrating tablet, Take 1 tablet (30 mg total) by mouth daily., Disp: 30 tablet, Rfl: 2 .  Lorcaserin HCl (BELVIQ) 10 MG TABS, Take 1 tablet by mouth 2 (two) times daily., Disp: 60 tablet, Rfl: 0 .  magnesium citrate SOLN, Take 296 mLs (1 Bottle total) by mouth once., Disp: 195 mL, Rfl: 2 .  meclizine (ANTIVERT) 25 MG tablet, Take 12.5-25 mg by mouth., Disp: ,  Rfl:  .  mometasone (NASONEX) 50 MCG/ACT nasal spray, Place 2 sprays into the nose daily., Disp: 17 g, Rfl: 12 .  ondansetron (ZOFRAN ODT) 4 MG disintegrating tablet, Take 1-2 tablets (4-8 mg total) by mouth every 8 (eight) hours as needed for nausea or vomiting., Disp: 20 tablet, Rfl: 0 .  polyethylene glycol (MIRALAX) packet, Take 17 g by mouth daily., Disp: 14 each, Rfl: 0 .  potassium chloride 20 MEQ/15ML (10%) SOLN, Take 7.5 mLs (10 mEq total) by mouth daily., Disp: 3800 mL, Rfl: 1 .   RANITIDINE HCL PO, Take by mouth., Disp: , Rfl:  .  spironolactone (ALDACTONE) 25 MG tablet, Take 1 tablet (25 mg total) by mouth daily., Disp: 30 tablet, Rfl: 1 .  UNABLE TO FIND, Med Name:  Dilatiazem 2% Cream Use Daily For Fistuals, Disp: , Rfl:   Drug Allergies:  Allergies  Allergen Reactions  . Coreg [Carvedilol] Shortness Of Breath    CP  . Mushroom Extract Complex Anaphylaxis  . Nitrofurantoin Shortness Of Breath    REACTION: sweats  . Promethazine Hcl Anaphylaxis    jittery  . Telmisartan Swelling  . Adhesive [Tape]     EKG monitor patches, some tapes"reddnes,blisters"  . Aspirin Other (See Comments)    flushing  . Atenolol Other (See Comments)    Squeezing chest sensation  . Avelox [Moxifloxacin Hcl In Nacl] Itching and Other (See Comments)    Shortness of breath  . Azithromycin Other (See Comments)    Lip swelling, SOB.   . Beta Adrenergic Blockers     Feels like chest tighting "Metoprolol"  . Butorphanol Tartrate     REACTION: unknown  . Ciprofloxacin     REACTION: tongue swells  . Clonidine Hydrochloride     REACTION: makes blood pressure high  . Cortisone   . Cyprodenate Itching  . Doxycycline   . Fentanyl     High [facility] record   . Fluoxetine Hcl Other (See Comments)    REACTION: headaches  . Iron   . Ketorolac Tromethamine     jittery  . Lidocaine Other (See Comments)    "It messes me up".  "I can't take it."  . Lisinopril Cough    REACTION: cough  . Metoclopramide Hcl Other (See Comments)    Has a twitchy feeling  . Metoprolol     Other reaction(s): OTHER  . Milk-Related Compounds   . Montelukast Sodium     Unknown"Singulair"  . Naproxen   . Paroxetine Other (See Comments)    REACTION: headaches  . Pravastatin Other (See Comments)    Myalgias  . Sertraline Hcl     REACTION: headaches  . Spironolactone   . Stelazine [Trifluoperazine]   . Tobramycin     itching , rash  . Trifluoperazine Hcl     REACTION: unknown  .  Vancomycin     Other reaction(s): Other (See Comments), Unknown Other Reaction: all mycins  . Versed [Midazolam]     High [facility] medical record  . Ceftriaxone Sodium Rash  . Erythromycin Rash  . Metronidazole Rash  . Penicillins Rash  . Prochlorperazine Anxiety  . Quinolones Rash and Swelling  . Sulfonamide Derivatives Rash  . Venlafaxine Anxiety  . Zyrtec [Cetirizine Hcl] Rash    All over body    ROS: Per HPI unless specifically indicated below Review of Systems  Thank you for the opportunity to care for this patient.  Please do not hesitate to contact me with questions.

## 2015-07-30 NOTE — Progress Notes (Signed)
   Subjective:    Patient ID: Jennifer [name], female    DOB: 15-Oct-1965, 49 y.o.   MRN: [id number]  HPI Started feeling worse yesterday.  Vomited yesterday morning.  Getting gagged with her cough. No fever.  Bloody sinuses the last 2 days she is still feeling swollen and she is horse.  Having reflux as well. She took ranitidine. Sinuses are bloody again for the last few days.    Has been feeling off balance again. Says feels like she is falling back.  Has had period episodes where she has felt like this before has been going on for years.    Hypertension- doing OK with amlodipine but hasn't been taking it consistantly. Thinks he would really like to try the spironolactone again. Says when she did take it her BP was the best controlled.  She had a hard time keeping her fluids up .   Review of Systems     Objective:   Physical Exam  Constitutional: She is oriented to person, place, and time. She appears well-developed and well-nourished.  HENT:  Head: Normocephalic and atraumatic.  Right Ear: External ear normal.  Left Ear: External ear normal.  Nose: Nose normal.  Mouth/Throat: Oropharynx is clear and moist.  TMs and canals are clear.   Eyes: Conjunctivae and EOM are normal. Pupils are equal, round, and reactive to light.  Neck: Neck supple. No thyromegaly present.  Cardiovascular: Normal rate, regular rhythm and normal heart sounds.   Pulmonary/Chest: Effort normal and breath sounds normal. She has no wheezes.  Lymphadenopathy:    She has no cervical adenopathy.  Neurological: She is alert and oriented to person, place, and time.  Skin: Skin is warm and dry.  Psychiatric: She has a normal mood and affect.          Assessment & Plan:  Acute sinusitis - will treat with clidamycin . F/U in 2 weeks. Call if not better in one week.  Continue with nasal saline and humidifier.    GERD - restart the ranitidine. Some of her current sxss with her throat irriation could be reflux  related.    HTN - she wants to retry the spironolactone. Will start with low dose of 25mg . Will have to hydrate well on this.

## 2015-07-30 NOTE — Patient Instructions (Signed)
Other allergic rhinitis  With current sinusitis  Take clindamycin per PCP  Continue Nasonex 2 sprays each nostril daily. Use nasal saline lavage prior to nasal spray  Asthma  Intermittent, with vocal cord dysfunction  Continue Xopenex as needed

## 2015-07-30 NOTE — Telephone Encounter (Signed)
The pharmacy called and state that they don't have the 75 mg of the clindamycin and wants to know if it could be switched to the 150 mg. Please advise.

## 2015-07-30 NOTE — Telephone Encounter (Signed)
Please advise on directions for clindamycin.

## 2015-07-30 NOTE — Telephone Encounter (Signed)
Yes-- that's OK

## 2015-07-30 NOTE — Telephone Encounter (Signed)
Patient seen in office, lab results given then.

## 2015-07-30 NOTE — Assessment & Plan Note (Signed)
   Intermittent, with vocal cord dysfunction  Continue Xopenex as needed

## 2015-07-30 NOTE — Telephone Encounter (Signed)
Okay to do 150 mg twice a day.

## 2015-07-30 NOTE — Assessment & Plan Note (Signed)
   With current sinusitis  Take clindamycin per PCP  Continue Nasonex 2 sprays each nostril daily. Use nasal saline lavage prior to nasal spray

## 2015-07-31 LAB — CULTURE, GROUP A STREP: Strep A Culture: NEGATIVE

## 2015-08-01 ENCOUNTER — Encounter: Payer: Self-pay | Admitting: Family Medicine

## 2015-08-01 IMAGING — CR DG CHEST 2V
2 series · 2 of 2 positions shown · non-contrast
Comparison: 04/17/2014 and chest CT 04/22/2014

CLINICAL DATA: Right upper quadrant pain.

EXAM:
CHEST  2 VIEW

[view not recorded (1 of 2)]
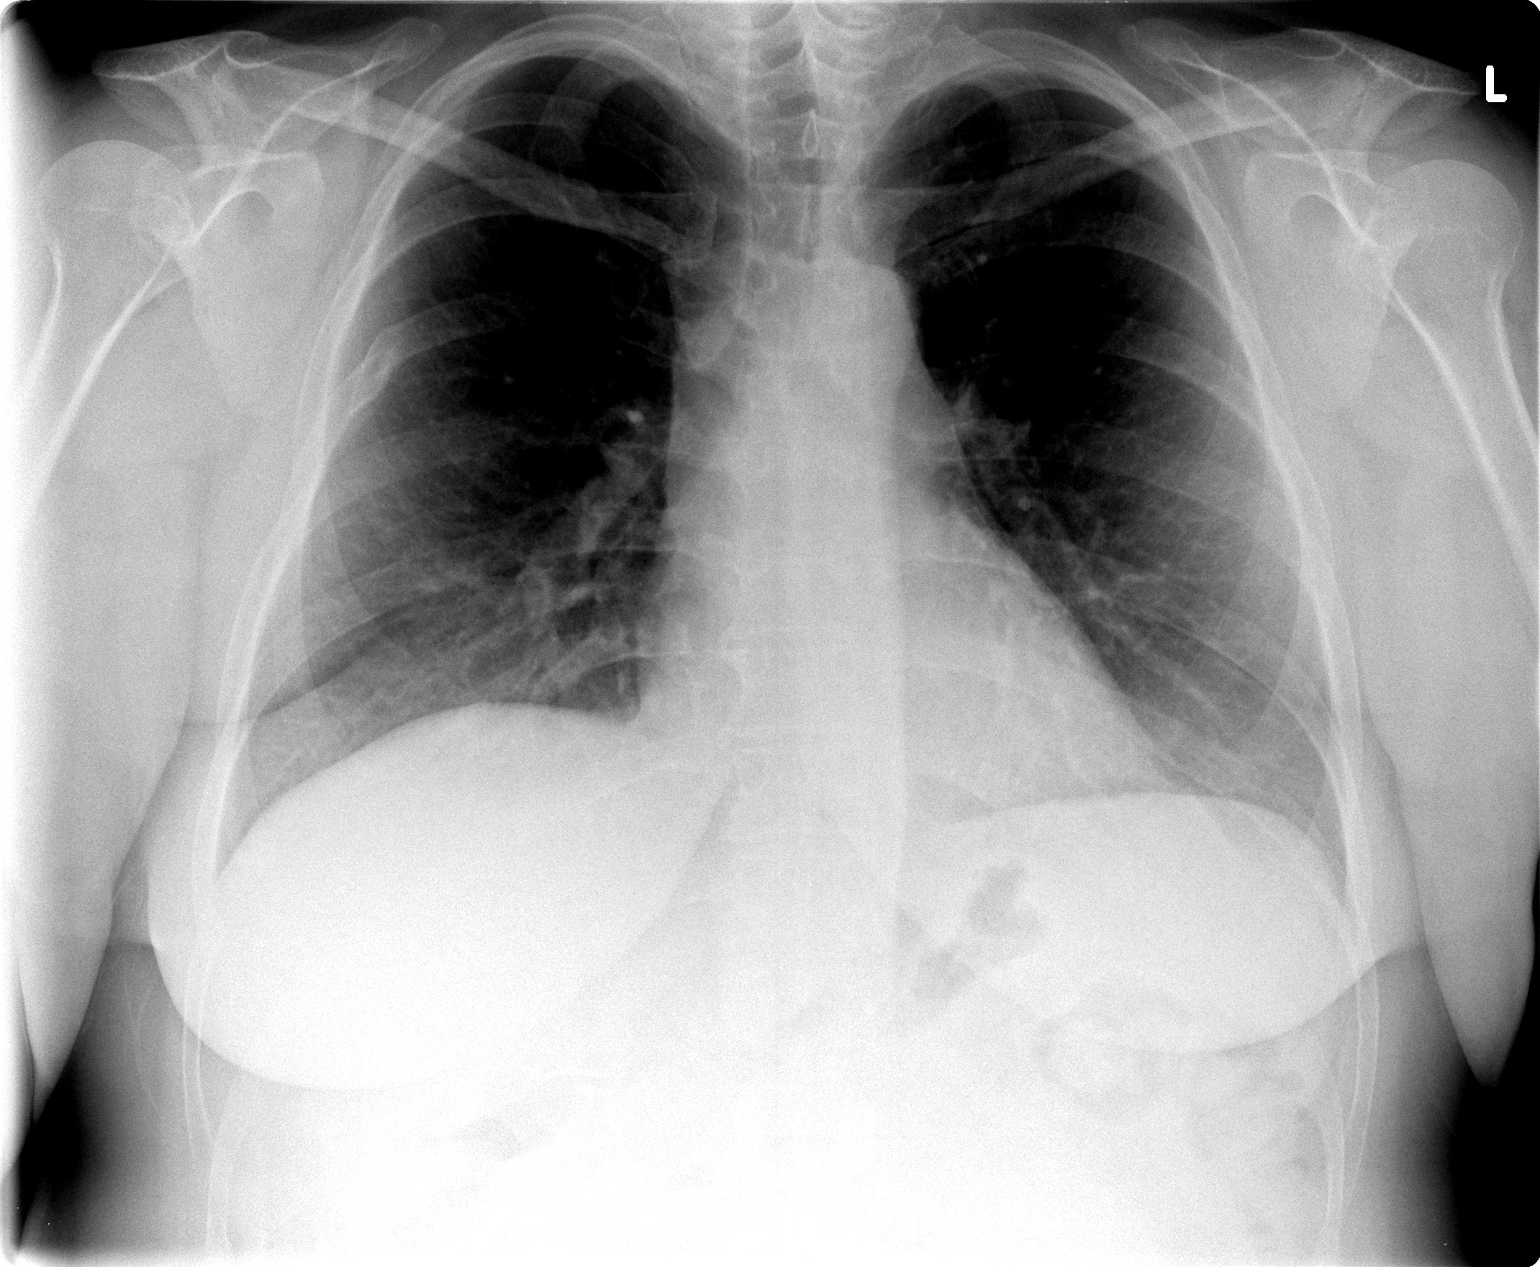

[view not recorded (2 of 2)]
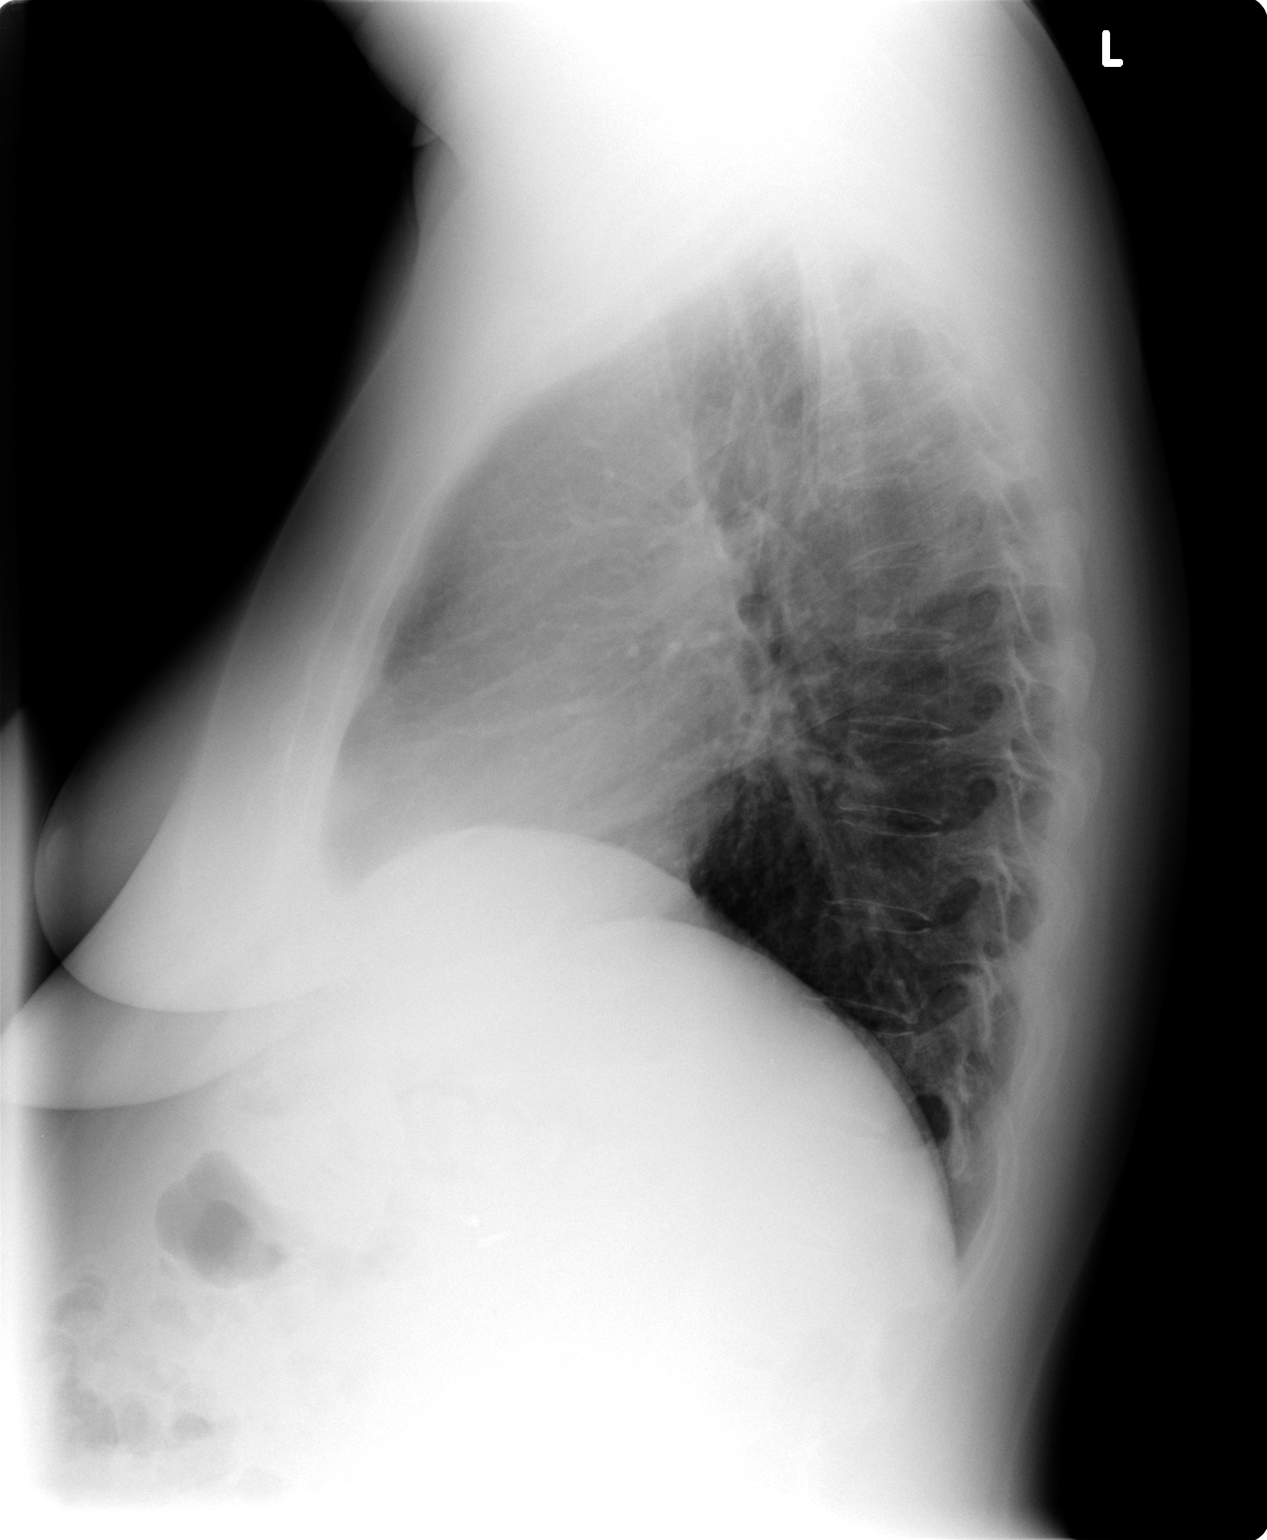

[2 of 2 positions shown; findings below may reference images not displayed]

FINDINGS: There is an old right sixth rib fracture. Lungs are clear
bilaterally. Heart and mediastinum are within normal limits. The
trachea is midline. No acute bone abnormality.
IMPRESSION: No active cardiopulmonary disease.

## 2015-08-02 ENCOUNTER — Telehealth: Payer: Self-pay

## 2015-08-02 DIAGNOSIS — E876 Hypokalemia: Secondary | ICD-10-CM | POA: Diagnosis not present

## 2015-08-02 DIAGNOSIS — J069 Acute upper respiratory infection, unspecified: Secondary | ICD-10-CM | POA: Diagnosis not present

## 2015-08-02 DIAGNOSIS — G35 Multiple sclerosis: Secondary | ICD-10-CM | POA: Diagnosis not present

## 2015-08-02 MED ORDER — CLINDAMYCIN HCL 150 MG PO CAPS: 150.0000 mg | ORAL_CAPSULE | Freq: Two times a day (BID) | ORAL | Status: DC

## 2015-08-02 NOTE — Telephone Encounter (Signed)
New rx for clindamycin sent to pharmacy.

## 2015-08-02 NOTE — Telephone Encounter (Signed)
Jennifer [name] wants to know if she should continue taking potassium now that she was switched to spironolactone. Her potassium was 3.9 today.

## 2015-08-02 NOTE — Telephone Encounter (Signed)
Ok to hold potassium and then recheck in one week.

## 2015-08-03 ENCOUNTER — Ambulatory Visit: Payer: Self-pay | Admitting: Family Medicine

## 2015-08-03 ENCOUNTER — Encounter: Payer: Self-pay | Admitting: Internal Medicine

## 2015-08-03 NOTE — Telephone Encounter (Signed)
lmsg for pt to hold the potassium and recheck in one week

## 2015-08-04 ENCOUNTER — Emergency Department (HOSPITAL_BASED_OUTPATIENT_CLINIC_OR_DEPARTMENT_OTHER)
Admission: EM | Admit: 2015-08-04 | Discharge: 2015-08-04 | Disposition: A | Discharge status: Home / Self Care | Payer: Medicare Other | Attending: Emergency Medicine | Admitting: Emergency Medicine

## 2015-08-04 ENCOUNTER — Encounter (HOSPITAL_BASED_OUTPATIENT_CLINIC_OR_DEPARTMENT_OTHER): Payer: Self-pay

## 2015-08-04 ENCOUNTER — Encounter: Payer: Self-pay | Admitting: *Deleted

## 2015-08-04 DIAGNOSIS — Z79899 Other long term (current) drug therapy: Secondary | ICD-10-CM | POA: Insufficient documentation

## 2015-08-04 DIAGNOSIS — F17210 Nicotine dependence, cigarettes, uncomplicated: Secondary | ICD-10-CM | POA: Diagnosis not present

## 2015-08-04 DIAGNOSIS — Z7952 Long term (current) use of systemic steroids: Secondary | ICD-10-CM | POA: Diagnosis not present

## 2015-08-04 DIAGNOSIS — Z8542 Personal history of malignant neoplasm of other parts of uterus: Secondary | ICD-10-CM | POA: Diagnosis not present

## 2015-08-04 DIAGNOSIS — J45909 Unspecified asthma, uncomplicated: Secondary | ICD-10-CM | POA: Diagnosis not present

## 2015-08-04 DIAGNOSIS — K219 Gastro-esophageal reflux disease without esophagitis: Secondary | ICD-10-CM | POA: Insufficient documentation

## 2015-08-04 DIAGNOSIS — Z8544 Personal history of malignant neoplasm of other female genital organs: Secondary | ICD-10-CM | POA: Insufficient documentation

## 2015-08-04 DIAGNOSIS — Z880 Allergy status to penicillin: Secondary | ICD-10-CM | POA: Diagnosis not present

## 2015-08-04 DIAGNOSIS — R110 Nausea: Secondary | ICD-10-CM

## 2015-08-04 DIAGNOSIS — R0981 Nasal congestion: Secondary | ICD-10-CM | POA: Diagnosis not present

## 2015-08-04 DIAGNOSIS — Z8614 Personal history of Methicillin resistant Staphylococcus aureus infection: Secondary | ICD-10-CM | POA: Diagnosis not present

## 2015-08-04 DIAGNOSIS — G4730 Sleep apnea, unspecified: Secondary | ICD-10-CM | POA: Insufficient documentation

## 2015-08-04 DIAGNOSIS — Z87890 Personal history of sex reassignment: Secondary | ICD-10-CM | POA: Insufficient documentation

## 2015-08-04 DIAGNOSIS — Z8639 Personal history of other endocrine, nutritional and metabolic disease: Secondary | ICD-10-CM | POA: Insufficient documentation

## 2015-08-04 DIAGNOSIS — I10 Essential (primary) hypertension: Secondary | ICD-10-CM | POA: Insufficient documentation

## 2015-08-04 DIAGNOSIS — Z862 Personal history of diseases of the blood and blood-forming organs and certain disorders involving the immune mechanism: Secondary | ICD-10-CM | POA: Diagnosis not present

## 2015-08-04 DIAGNOSIS — Z7951 Long term (current) use of inhaled steroids: Secondary | ICD-10-CM | POA: Insufficient documentation

## 2015-08-04 DIAGNOSIS — Z9851 Tubal ligation status: Secondary | ICD-10-CM | POA: Insufficient documentation

## 2015-08-04 DIAGNOSIS — Z792 Long term (current) use of antibiotics: Secondary | ICD-10-CM | POA: Diagnosis not present

## 2015-08-04 LAB — CBC WITH DIFFERENTIAL/PLATELET
Basophils Absolute: 0 K/uL (ref 0.0–0.1)
Basophils Relative: 1 %
Eosinophils Absolute: 0.2 K/uL (ref 0.0–0.7)
Eosinophils Relative: 3 %
HCT: 42.2 % (ref 36.0–46.0)
Hemoglobin: 13.6 g/dL (ref 12.0–15.0)
Lymphocytes Relative: 26 %
Lymphs Abs: 1.6 K/uL (ref 0.7–4.0)
MCH: 29.7 pg (ref 26.0–34.0)
MCHC: 32.2 g/dL (ref 30.0–36.0)
MCV: 92.1 fL (ref 78.0–100.0)
Monocytes Absolute: 0.5 K/uL (ref 0.1–1.0)
Monocytes Relative: 9 %
Neutro Abs: 3.8 K/uL (ref 1.7–7.7)
Neutrophils Relative %: 61 %
Platelets: 177 K/uL (ref 150–400)
RBC: 4.58 MIL/uL (ref 3.87–5.11)
RDW: 14 % (ref 11.5–15.5)
WBC: 6.1 K/uL (ref 4.0–10.5)

## 2015-08-04 LAB — URINALYSIS, ROUTINE W REFLEX MICROSCOPIC
Bilirubin Urine: NEGATIVE
Glucose, UA: NEGATIVE mg/dL
Hgb urine dipstick: NEGATIVE
Ketones, ur: NEGATIVE mg/dL
Leukocytes, UA: NEGATIVE
Nitrite: NEGATIVE
Protein, ur: NEGATIVE mg/dL
Specific Gravity, Urine: 1.017 (ref 1.005–1.030)
pH: 6.5 (ref 5.0–8.0)

## 2015-08-04 LAB — BASIC METABOLIC PANEL WITH GFR
Anion gap: 6 (ref 5–15)
BUN: 12 mg/dL (ref 6–20)
CO2: 25 mmol/L (ref 22–32)
Calcium: 8.5 mg/dL — ABNORMAL LOW (ref 8.9–10.3)
Chloride: 108 mmol/L (ref 101–111)
Creatinine, Ser: 0.52 mg/dL (ref 0.44–1.00)
GFR calc Af Amer: >60 mL/min
GFR calc non Af Amer: >60 mL/min
Glucose, Bld: 94 mg/dL (ref 65–99)
Potassium: 3.4 mmol/L — ABNORMAL LOW (ref 3.5–5.1)
Sodium: 139 mmol/L (ref 135–145)

## 2015-08-04 MED ORDER — SODIUM CHLORIDE 0.9 % IV BOLUS (SEPSIS)
1000.0000 mL | Freq: Once | INTRAVENOUS | Status: AC
  Administered 2015-08-04: 1000 mL via INTRAVENOUS

## 2015-08-04 MED ORDER — ONDANSETRON 4 MG PO TBDP: 4.0000 mg | ORAL_TABLET | Freq: Three times a day (TID) | ORAL | Status: DC | PRN

## 2015-08-04 MED ORDER — ONDANSETRON HCL 4 MG/2ML IJ SOLN
2.0000 mg | Freq: Once | INTRAMUSCULAR | Status: AC
  Administered 2015-08-04: 2 mg via INTRAVENOUS
  Filled 2015-08-04: qty 2

## 2015-08-04 MED ORDER — ONDANSETRON HCL 4 MG PO TABS: 4.0000 mg | ORAL_TABLET | Freq: Four times a day (QID) | ORAL | Status: DC

## 2015-08-04 NOTE — ED Notes (Signed)
C/o abd pain, nausea-taking abx for sinus infection x 1 week-pt NAD

## 2015-08-04 NOTE — Discharge Instructions (Signed)
Nausea, Adult Nausea is the feeling that you have an upset stomach or have to vomit. Nausea by itself is not likely a serious concern, but it may be an early sign of more serious medical problems. As nausea gets worse, it can lead to vomiting. If vomiting develops, there is the risk of dehydration.  CAUSES   Viral infections.  Food poisoning.  Medicines.  Pregnancy.  Motion sickness.  Migraine headaches.  Emotional distress.  Severe pain from any source.  Alcohol intoxication. HOME CARE INSTRUCTIONS  Get plenty of rest.  Ask your caregiver about specific rehydration instructions.  Eat small amounts of food and sip liquids more often.  Take all medicines as told by your caregiver. SEEK MEDICAL CARE IF:  You have not improved after 2 days, or you get worse.  You have a headache. SEEK IMMEDIATE MEDICAL CARE IF:   You have a fever.  You faint.  You keep vomiting or have blood in your vomit.  You are extremely weak or dehydrated.  You have dark or bloody stools.  You have severe chest or abdominal pain. MAKE SURE YOU:  Understand these instructions.  Will watch your condition.  Will get help right away if you are not doing well or get worse.   This information is not intended to replace advice given to you by your health care provider. Make sure you discuss any questions you have with your health care provider.   Follow up with your primary care provider as needed for re-evaluation. Complete home antibiotics. If sinus symptoms persist after antibiotic completion, schedule appointment with your ENT provider. Continue home nasal rinsing. Return to the ED if you experience severe worsening of your symptoms, fever, difficulty breathing, chest pain, vomiting.
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
## 2015-08-10 DIAGNOSIS — Z87891 Personal history of nicotine dependence: Secondary | ICD-10-CM | POA: Diagnosis not present

## 2015-08-10 DIAGNOSIS — Z886 Allergy status to analgesic agent status: Secondary | ICD-10-CM | POA: Diagnosis not present

## 2015-08-10 DIAGNOSIS — Z885 Allergy status to narcotic agent status: Secondary | ICD-10-CM | POA: Diagnosis not present

## 2015-08-10 DIAGNOSIS — E876 Hypokalemia: Secondary | ICD-10-CM | POA: Diagnosis not present

## 2015-08-10 DIAGNOSIS — R000 Tachycardia, unspecified: Secondary | ICD-10-CM | POA: Diagnosis not present

## 2015-08-10 DIAGNOSIS — G40909 Epilepsy, unspecified, not intractable, without status epilepticus: Secondary | ICD-10-CM | POA: Diagnosis not present

## 2015-08-10 DIAGNOSIS — J45909 Unspecified asthma, uncomplicated: Secondary | ICD-10-CM | POA: Diagnosis not present

## 2015-08-10 DIAGNOSIS — R079 Chest pain, unspecified: Secondary | ICD-10-CM | POA: Diagnosis not present

## 2015-08-10 DIAGNOSIS — Z888 Allergy status to other drugs, medicaments and biological substances status: Secondary | ICD-10-CM | POA: Diagnosis not present

## 2015-08-10 DIAGNOSIS — G4730 Sleep apnea, unspecified: Secondary | ICD-10-CM | POA: Diagnosis not present

## 2015-08-10 DIAGNOSIS — R002 Palpitations: Secondary | ICD-10-CM | POA: Diagnosis not present

## 2015-08-10 DIAGNOSIS — I10 Essential (primary) hypertension: Secondary | ICD-10-CM | POA: Diagnosis not present

## 2015-08-12 ENCOUNTER — Encounter: Payer: Self-pay | Admitting: Family Medicine

## 2015-08-13 ENCOUNTER — Ambulatory Visit (INDEPENDENT_AMBULATORY_CARE_PROVIDER_SITE_OTHER): Payer: Medicare Other | Admitting: Family Medicine

## 2015-08-13 ENCOUNTER — Encounter: Payer: Self-pay | Admitting: Family Medicine

## 2015-08-13 VITALS — BP 159/94 | HR 94 | Wt 201.0 lb

## 2015-08-13 DIAGNOSIS — J0101 Acute recurrent maxillary sinusitis: Secondary | ICD-10-CM

## 2015-08-13 DIAGNOSIS — I10 Essential (primary) hypertension: Secondary | ICD-10-CM

## 2015-08-13 DIAGNOSIS — E876 Hypokalemia: Secondary | ICD-10-CM

## 2015-08-13 DIAGNOSIS — M797 Fibromyalgia: Secondary | ICD-10-CM | POA: Diagnosis not present

## 2015-08-13 MED ORDER — CLINDAMYCIN HCL 150 MG PO CAPS: 150.0000 mg | ORAL_CAPSULE | Freq: Two times a day (BID) | ORAL | Status: DC

## 2015-08-13 MED ORDER — METOPROLOL TARTRATE 25 MG PO TABS: 12.5000 mg | ORAL_TABLET | Freq: Two times a day (BID) | ORAL | Status: DC

## 2015-08-13 NOTE — Progress Notes (Signed)
Subjective:    Patient ID: Jennifer [name], female    DOB: May 28, 1966, 49 y.o.   MRN: [id number]  HPI HTN - she tried to spironolactone again for a few days and she didn't feel well on the med so stopped it again. Then went to ED for racing heart.  Has had neg w/u before.  Has also had hard time swallowing again.  Feels like can't breath well after she eats.  She has really cut milk out which is a trigger for here.  While in the emergency permit she was told that her potassium was low. She actually held her potassium while taking the spironolactone for a few days.  Sinus pain and pressure and congestion. She did feel better on the clindamycin but says within a few days after stopping it she started to feel worse again. She is now having some left maxillary sinus pressure and a lot of thick sputum and congestion. She has been using her saline irrigation. She feels like her throat is getting clogged with phlegm. She also feels like it's swollen at times.  Hasn't started weight loss medication.  She has gained another pounds and is upset about that.  She still wants it a little more reading on it before she starts anything. She's been trying to eat more salads and try to be a little bit more active though no designated exercise time per se.  Over the last couple weeks she's also been exerting more lower extremity pain. The thighs and calves have been very sore and painful periods been difficult for her to go up and down stairs at her apartment. She's also been having some knee pain but has known osteoporosis the knees. The left chest wall and axilla are hurting again as well as her left shoulder.   Review of Systems     Objective:   Physical Exam  Constitutional: She is oriented to person, place, and time. She appears well-developed and well-nourished.  HENT:  Head: Normocephalic and atraumatic.  Right Ear: External ear normal.  Left Ear: External ear normal.  Nose: Nose normal.   Mouth/Throat: Oropharynx is clear and moist.  TMs and canals are clear.   Eyes: Conjunctivae and EOM are normal. Pupils are equal, round, and reactive to light.  Neck: Neck supple. No thyromegaly present.  Cardiovascular: Normal rate, regular rhythm and normal heart sounds.   Pulmonary/Chest: Effort normal and breath sounds normal. She has no wheezes.  Lymphadenopathy:    She has no cervical adenopathy.  Neurological: She is alert and oriented to person, place, and time.  Skin: Skin is warm and dry.  Psychiatric: She has a normal mood and affect.          Assessment & Plan:  Hypertension-uncontrolled. She would like to retry a beta blocker at low dose. She knows it would help her tachycardic episodes in addition to controlling her blood pressure. She is willing to retry metoprolol. She tends to get a chest tightness when she takes beta blockers so we will see how this goes. Start with 25 mg once a day half tab and if tolerated increase to a half a tab twice a day. Follow-up in 2 weeks. Can always consider restarting amlodipine but at a higher dose.   Hypokalemia - go ahead and restart potassium. She was given some exercise limitation in the emergency department but has not taken her home dose since then. Okay to restart home daily therapeutic regimen since she is off the spironolactone.  Acute sinusitis-she did improve with the clindamycin but started to feel bad after completing her 10 day regimen. We will try a second round of antibiotics but for 15 days instead of 10 this time. Continue with nasal saline irrigation and nasal steroid spray. She really is very limited in what antibiotics she can take. Normally I would switch her to a fluoroquinolone but she does not tolerate these well.  Fibromyalgia-her symptoms are consistent with fibromyalgia. The way she is describing her pain and tenderness of tissue it's most consistent with this. We discussed that there are 3 main treatments for  prior myalgia including medication, regular exercise that is moderated and working on sleep quality. She admits she is not sleeping well recently. We discussed that we could certainly consider a trial of medication. Gabapentin could be a good choice for her as it's also an anti-seizure drug with her history of seizures in addition to helping with hot flashes which she currently experiences in addition to helping with fibro pain. Will consider starting this medication when I see her back in 2 weeks.

## 2015-08-14 DIAGNOSIS — R0789 Other chest pain: Secondary | ICD-10-CM | POA: Diagnosis not present

## 2015-08-14 DIAGNOSIS — M791 Myalgia: Secondary | ICD-10-CM | POA: Diagnosis not present

## 2015-08-14 DIAGNOSIS — F329 Major depressive disorder, single episode, unspecified: Secondary | ICD-10-CM | POA: Diagnosis not present

## 2015-08-14 DIAGNOSIS — Z8542 Personal history of malignant neoplasm of other parts of uterus: Secondary | ICD-10-CM | POA: Diagnosis not present

## 2015-08-14 DIAGNOSIS — J45909 Unspecified asthma, uncomplicated: Secondary | ICD-10-CM | POA: Diagnosis not present

## 2015-08-14 DIAGNOSIS — G4733 Obstructive sleep apnea (adult) (pediatric): Secondary | ICD-10-CM | POA: Diagnosis not present

## 2015-08-14 DIAGNOSIS — I10 Essential (primary) hypertension: Secondary | ICD-10-CM | POA: Diagnosis not present

## 2015-08-14 DIAGNOSIS — G8929 Other chronic pain: Secondary | ICD-10-CM | POA: Diagnosis not present

## 2015-08-14 DIAGNOSIS — G35 Multiple sclerosis: Secondary | ICD-10-CM | POA: Diagnosis not present

## 2015-08-16 DIAGNOSIS — Z87891 Personal history of nicotine dependence: Secondary | ICD-10-CM | POA: Diagnosis not present

## 2015-08-16 DIAGNOSIS — C4499 Other specified malignant neoplasm of skin, unspecified: Secondary | ICD-10-CM | POA: Diagnosis not present

## 2015-08-16 DIAGNOSIS — Z79899 Other long term (current) drug therapy: Secondary | ICD-10-CM | POA: Diagnosis not present

## 2015-08-16 DIAGNOSIS — R9431 Abnormal electrocardiogram [ECG] [EKG]: Secondary | ICD-10-CM | POA: Diagnosis not present

## 2015-08-16 DIAGNOSIS — M25519 Pain in unspecified shoulder: Secondary | ICD-10-CM | POA: Diagnosis not present

## 2015-08-16 DIAGNOSIS — K2270 Barrett's esophagus without dysplasia: Secondary | ICD-10-CM | POA: Diagnosis not present

## 2015-08-16 DIAGNOSIS — K219 Gastro-esophageal reflux disease without esophagitis: Secondary | ICD-10-CM | POA: Diagnosis not present

## 2015-08-16 DIAGNOSIS — E269 Hyperaldosteronism, unspecified: Secondary | ICD-10-CM | POA: Diagnosis not present

## 2015-08-16 DIAGNOSIS — R569 Unspecified convulsions: Secondary | ICD-10-CM | POA: Diagnosis not present

## 2015-08-16 DIAGNOSIS — I10 Essential (primary) hypertension: Secondary | ICD-10-CM | POA: Diagnosis not present

## 2015-08-16 DIAGNOSIS — J45909 Unspecified asthma, uncomplicated: Secondary | ICD-10-CM | POA: Diagnosis not present

## 2015-08-16 DIAGNOSIS — R079 Chest pain, unspecified: Secondary | ICD-10-CM | POA: Diagnosis not present

## 2015-08-16 DIAGNOSIS — Z7951 Long term (current) use of inhaled steroids: Secondary | ICD-10-CM | POA: Diagnosis not present

## 2015-08-16 DIAGNOSIS — G35 Multiple sclerosis: Secondary | ICD-10-CM | POA: Diagnosis not present

## 2015-08-16 DIAGNOSIS — M25512 Pain in left shoulder: Secondary | ICD-10-CM | POA: Diagnosis not present

## 2015-08-16 DIAGNOSIS — F419 Anxiety disorder, unspecified: Secondary | ICD-10-CM | POA: Diagnosis not present

## 2015-08-16 DIAGNOSIS — R0789 Other chest pain: Secondary | ICD-10-CM | POA: Diagnosis not present

## 2015-08-16 LAB — HEPATIC FUNCTION PANEL
ALT: 23 U/L (ref 7–35)
AST: 18 U/L (ref 13–35)
Alkaline Phosphatase: 94 U/L (ref 25–125)
Bilirubin, Total: 0.3 mg/dL

## 2015-08-16 LAB — D-DIMER, QUANTITATIVE: D-dimer: <0.1

## 2015-08-16 LAB — COMPREHENSIVE METABOLIC PANEL
Albumin/Glob SerPl: 1.3
Albumin: 3.7
BUN/Creatinine Ratio: 31
CO2: 24 mmol/L
Chloride: 106 mmol/L
EGFR (Non-African Amer.): 121
Globulin: 2.9
Sodium: 143
Total Protein: 6.6 g/dL

## 2015-08-16 LAB — BASIC METABOLIC PANEL
BUN: 13 mg/dL (ref 4–21)
Creatinine: 0.4 mg/dL — AB (ref 0.5–1.1)
Glucose: 146 mg/dL
Potassium: 3.9 mmol/L (ref 3.4–5.3)

## 2015-08-17 ENCOUNTER — Telehealth: Payer: Self-pay

## 2015-08-17 ENCOUNTER — Ambulatory Visit (INDEPENDENT_AMBULATORY_CARE_PROVIDER_SITE_OTHER): Payer: Medicare Other | Admitting: Family Medicine

## 2015-08-17 ENCOUNTER — Encounter: Payer: Self-pay | Admitting: Family Medicine

## 2015-08-17 VITALS — BP 151/90 | HR 87 | Wt 200.0 lb

## 2015-08-17 DIAGNOSIS — I10 Essential (primary) hypertension: Secondary | ICD-10-CM

## 2015-08-17 DIAGNOSIS — R519 Headache, unspecified: Secondary | ICD-10-CM

## 2015-08-17 DIAGNOSIS — R51 Headache: Secondary | ICD-10-CM

## 2015-08-17 DIAGNOSIS — R002 Palpitations: Secondary | ICD-10-CM

## 2015-08-17 DIAGNOSIS — Z5321 Procedure and treatment not carried out due to patient leaving prior to being seen by health care provider: Secondary | ICD-10-CM | POA: Diagnosis not present

## 2015-08-17 DIAGNOSIS — R251 Tremor, unspecified: Secondary | ICD-10-CM | POA: Diagnosis not present

## 2015-08-17 MED ORDER — METOPROLOL SUCCINATE ER 25 MG PO TB24: 25.0000 mg | ORAL_TABLET | Freq: Every day | ORAL | Status: DC

## 2015-08-17 MED ORDER — ACETAMINOPHEN 325 MG PO TABS
650.0000 mg | ORAL_TABLET | Freq: Once | ORAL | Status: AC
  Administered 2015-08-17: 650 mg via ORAL

## 2015-08-17 NOTE — Telephone Encounter (Signed)
Patient is taking metoprolol 12.5 at 11am and 9pm.  She is waking up in the morning and feels like her blood pressure is elevated.  Appointment made for patient to see Dr Madilyn Fireman to discuss issue.

## 2015-08-17 NOTE — Progress Notes (Signed)
   Subjective:    Patient ID: Jennifer [name], female    DOB: 11-24-1965, 50 y.o.   MRN: [id number]  HPI She has been waking up really weak and shakey. Her heart has been palpitating  Getting  A throbbing in her head. Has been taking the metoprolol BID and does seem to help. Went ot ED at [facility] last night. Having cold sweats and has had a cough.  Says taking her BP medicine seems ot ease her throbbing in her head.  Says her chest and left shoulder have hurt.  Has had stomach pain. Bowels aremoving OK.  Feels like her nasal congestion is better.  She is not on the amlodipine.  She is taking her clindamycin.  Hypertension-she did start taking the metoprolol half a tab twice a day. She actually feels like it helps with her headache and chest pain about 30 minutes after she takes it. In fact she has not had any chest tightness was taking at this time as she has had in the past. In fact I saw her week ago she decided she wanted to retry the medication because she's been having more frequent palpitations. She does feel like it has helped control her palpitations as well.  Review of Systems     Objective:   Physical Exam  Constitutional: She is oriented to person, place, and time. She appears well-developed and well-nourished.  HENT:  Head: Normocephalic and atraumatic.  Right Ear: External ear normal.  Left Ear: External ear normal.  Nose: Nose normal.  Mouth/Throat: Oropharynx is clear and moist.  TMs and canals are clear.   Eyes: Conjunctivae and EOM are normal. Pupils are equal, round, and reactive to light.  Neck: Neck supple. No thyromegaly present.  Cardiovascular: Normal rate, regular rhythm and normal heart sounds.   Pulmonary/Chest: Effort normal and breath sounds normal. She has no wheezes.  Lymphadenopathy:    She has no cervical adenopathy.  Neurological: She is alert and oriented to person, place, and time.  Skin: Skin is warm and dry.  Psychiatric: She has a normal mood and  affect.          Assessment & Plan:  HA, throbbing - unclear etiology at this point. Certainly could be because her blood pressures elevated. It could also be from sinus pressure and pain. It sounds like she has been trying to hydrate well so is unlikely to be from dehydration. Recommend Tylenol for pain control at this point in time.  Hypertension-uncontrolled. Recommend switching to the extended release metoprolol she does seem to get some relief after she takes the medication she just felt like it wearing off too soon. She tolerates a half a tab that we can hopefully try increasing her to a whole tab for better control of blood pressure and symptoms. She has not had any chest tightness since this starting the medication which is reassuring.  Palpitations-better controlled on the beta blocker.

## 2015-08-18 ENCOUNTER — Ambulatory Visit: Payer: Self-pay | Admitting: Family Medicine

## 2015-08-19 ENCOUNTER — Telehealth: Payer: Self-pay | Admitting: *Deleted

## 2015-08-19 DIAGNOSIS — G35 Multiple sclerosis: Secondary | ICD-10-CM | POA: Diagnosis not present

## 2015-08-19 DIAGNOSIS — E876 Hypokalemia: Secondary | ICD-10-CM | POA: Diagnosis not present

## 2015-08-19 DIAGNOSIS — Z8542 Personal history of malignant neoplasm of other parts of uterus: Secondary | ICD-10-CM | POA: Diagnosis not present

## 2015-08-19 DIAGNOSIS — R000 Tachycardia, unspecified: Secondary | ICD-10-CM | POA: Diagnosis not present

## 2015-08-19 DIAGNOSIS — G8929 Other chronic pain: Secondary | ICD-10-CM | POA: Diagnosis not present

## 2015-08-19 DIAGNOSIS — I10 Essential (primary) hypertension: Secondary | ICD-10-CM | POA: Diagnosis not present

## 2015-08-19 DIAGNOSIS — Z87891 Personal history of nicotine dependence: Secondary | ICD-10-CM | POA: Diagnosis not present

## 2015-08-19 DIAGNOSIS — R002 Palpitations: Secondary | ICD-10-CM | POA: Diagnosis not present

## 2015-08-19 NOTE — Telephone Encounter (Signed)
Pt called to reschedule her appointment with Dr. Denman George on 08/20/2015. Pt states " Im not feeling well , My blood pressure is high. I want to reschedule". Pt was offered various days, but declined. Pt states  She needs an appointment on a Friday in the afternoon. Pt was offered Feb 3,2017 but declined because she has a appointment on this date already. Pt was scheduled to see Dr. Fermin Schwab on 2/30/2017.

## 2015-08-20 ENCOUNTER — Ambulatory Visit: Payer: Medicare Other | Admitting: Gynecologic Oncology

## 2015-08-22 ENCOUNTER — Emergency Department (HOSPITAL_BASED_OUTPATIENT_CLINIC_OR_DEPARTMENT_OTHER): Payer: Medicare Other

## 2015-08-22 ENCOUNTER — Emergency Department (HOSPITAL_BASED_OUTPATIENT_CLINIC_OR_DEPARTMENT_OTHER)
Admission: EM | Admit: 2015-08-22 | Discharge: 2015-08-22 | Disposition: A | Discharge status: Home / Self Care | Payer: Medicare Other | Attending: Emergency Medicine | Admitting: Emergency Medicine

## 2015-08-22 ENCOUNTER — Encounter (HOSPITAL_BASED_OUTPATIENT_CLINIC_OR_DEPARTMENT_OTHER): Payer: Self-pay | Admitting: Emergency Medicine

## 2015-08-22 DIAGNOSIS — G4730 Sleep apnea, unspecified: Secondary | ICD-10-CM | POA: Insufficient documentation

## 2015-08-22 DIAGNOSIS — F329 Major depressive disorder, single episode, unspecified: Secondary | ICD-10-CM | POA: Diagnosis not present

## 2015-08-22 DIAGNOSIS — J45909 Unspecified asthma, uncomplicated: Secondary | ICD-10-CM | POA: Insufficient documentation

## 2015-08-22 DIAGNOSIS — R079 Chest pain, unspecified: Secondary | ICD-10-CM | POA: Diagnosis not present

## 2015-08-22 DIAGNOSIS — I10 Essential (primary) hypertension: Secondary | ICD-10-CM | POA: Insufficient documentation

## 2015-08-22 DIAGNOSIS — Z7951 Long term (current) use of inhaled steroids: Secondary | ICD-10-CM | POA: Diagnosis not present

## 2015-08-22 DIAGNOSIS — R5381 Other malaise: Secondary | ICD-10-CM | POA: Diagnosis not present

## 2015-08-22 DIAGNOSIS — K219 Gastro-esophageal reflux disease without esophagitis: Secondary | ICD-10-CM | POA: Insufficient documentation

## 2015-08-22 DIAGNOSIS — Z79899 Other long term (current) drug therapy: Secondary | ICD-10-CM | POA: Diagnosis not present

## 2015-08-22 DIAGNOSIS — R05 Cough: Secondary | ICD-10-CM | POA: Insufficient documentation

## 2015-08-22 DIAGNOSIS — J069 Acute upper respiratory infection, unspecified: Secondary | ICD-10-CM | POA: Diagnosis not present

## 2015-08-22 DIAGNOSIS — R1110 Vomiting, unspecified: Secondary | ICD-10-CM | POA: Diagnosis not present

## 2015-08-22 DIAGNOSIS — Z862 Personal history of diseases of the blood and blood-forming organs and certain disorders involving the immune mechanism: Secondary | ICD-10-CM | POA: Diagnosis not present

## 2015-08-22 DIAGNOSIS — Z87891 Personal history of nicotine dependence: Secondary | ICD-10-CM | POA: Diagnosis not present

## 2015-08-22 DIAGNOSIS — J988 Other specified respiratory disorders: Secondary | ICD-10-CM | POA: Diagnosis not present

## 2015-08-22 DIAGNOSIS — Z8659 Personal history of other mental and behavioral disorders: Secondary | ICD-10-CM | POA: Diagnosis not present

## 2015-08-22 DIAGNOSIS — Z8541 Personal history of malignant neoplasm of cervix uteri: Secondary | ICD-10-CM | POA: Insufficient documentation

## 2015-08-22 DIAGNOSIS — Z880 Allergy status to penicillin: Secondary | ICD-10-CM | POA: Insufficient documentation

## 2015-08-22 DIAGNOSIS — Z8614 Personal history of Methicillin resistant Staphylococcus aureus infection: Secondary | ICD-10-CM | POA: Diagnosis not present

## 2015-08-22 DIAGNOSIS — Z8639 Personal history of other endocrine, nutritional and metabolic disease: Secondary | ICD-10-CM | POA: Insufficient documentation

## 2015-08-22 DIAGNOSIS — G8929 Other chronic pain: Secondary | ICD-10-CM | POA: Diagnosis not present

## 2015-08-22 DIAGNOSIS — R059 Cough, unspecified: Secondary | ICD-10-CM

## 2015-08-22 DIAGNOSIS — Z792 Long term (current) use of antibiotics: Secondary | ICD-10-CM | POA: Insufficient documentation

## 2015-08-22 DIAGNOSIS — R000 Tachycardia, unspecified: Secondary | ICD-10-CM | POA: Diagnosis not present

## 2015-08-22 MED ORDER — BENZONATATE 100 MG PO CAPS: 100.0000 mg | ORAL_CAPSULE | Freq: Three times a day (TID) | ORAL | Status: DC | PRN

## 2015-08-22 MED ORDER — ALBUTEROL SULFATE (2.5 MG/3ML) 0.083% IN NEBU
2.5000 mg | INHALATION_SOLUTION | Freq: Once | RESPIRATORY_TRACT | Status: AC
  Administered 2015-08-22: 2.5 mg via RESPIRATORY_TRACT
  Filled 2015-08-22: qty 3

## 2015-08-22 NOTE — ED Notes (Signed)
C/o cough x2 days, with sore throat, states she has been seen for this prior to this visit.States she has vomited but believes it to be due to coughing. Denies fever, or chills.

## 2015-08-22 NOTE — Discharge Instructions (Signed)
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
## 2015-08-24 ENCOUNTER — Ambulatory Visit (INDEPENDENT_AMBULATORY_CARE_PROVIDER_SITE_OTHER): Payer: Medicare Other | Admitting: Pediatrics

## 2015-08-24 ENCOUNTER — Encounter: Payer: Self-pay | Admitting: Pediatrics

## 2015-08-24 VITALS — BP 168/100 | HR 106 | Temp 98.7°F | Resp 20

## 2015-08-24 DIAGNOSIS — J383 Other diseases of vocal cords: Secondary | ICD-10-CM | POA: Insufficient documentation

## 2015-08-24 DIAGNOSIS — R062 Wheezing: Secondary | ICD-10-CM | POA: Diagnosis not present

## 2015-08-24 DIAGNOSIS — K219 Gastro-esophageal reflux disease without esophagitis: Secondary | ICD-10-CM | POA: Diagnosis not present

## 2015-08-24 DIAGNOSIS — Z87892 Personal history of anaphylaxis: Secondary | ICD-10-CM | POA: Diagnosis not present

## 2015-08-24 DIAGNOSIS — R093 Abnormal sputum: Secondary | ICD-10-CM | POA: Diagnosis not present

## 2015-08-24 DIAGNOSIS — R221 Localized swelling, mass and lump, neck: Secondary | ICD-10-CM | POA: Diagnosis not present

## 2015-08-24 DIAGNOSIS — Z8709 Personal history of other diseases of the respiratory system: Secondary | ICD-10-CM | POA: Diagnosis not present

## 2015-08-24 DIAGNOSIS — I471 Supraventricular tachycardia: Secondary | ICD-10-CM | POA: Diagnosis not present

## 2015-08-24 DIAGNOSIS — Z888 Allergy status to other drugs, medicaments and biological substances status: Secondary | ICD-10-CM | POA: Diagnosis not present

## 2015-08-24 DIAGNOSIS — Z886 Allergy status to analgesic agent status: Secondary | ICD-10-CM | POA: Diagnosis not present

## 2015-08-24 DIAGNOSIS — Z8542 Personal history of malignant neoplasm of other parts of uterus: Secondary | ICD-10-CM | POA: Diagnosis not present

## 2015-08-24 DIAGNOSIS — Z7951 Long term (current) use of inhaled steroids: Secondary | ICD-10-CM | POA: Diagnosis not present

## 2015-08-24 DIAGNOSIS — J45901 Unspecified asthma with (acute) exacerbation: Secondary | ICD-10-CM | POA: Insufficient documentation

## 2015-08-24 DIAGNOSIS — I10 Essential (primary) hypertension: Secondary | ICD-10-CM | POA: Diagnosis not present

## 2015-08-24 DIAGNOSIS — Z79899 Other long term (current) drug therapy: Secondary | ICD-10-CM | POA: Diagnosis not present

## 2015-08-24 DIAGNOSIS — R05 Cough: Secondary | ICD-10-CM | POA: Diagnosis not present

## 2015-08-24 DIAGNOSIS — J4521 Mild intermittent asthma with (acute) exacerbation: Secondary | ICD-10-CM

## 2015-08-24 DIAGNOSIS — J45909 Unspecified asthma, uncomplicated: Secondary | ICD-10-CM | POA: Diagnosis not present

## 2015-08-24 DIAGNOSIS — Z91018 Allergy to other foods: Secondary | ICD-10-CM | POA: Diagnosis not present

## 2015-08-24 DIAGNOSIS — Z882 Allergy status to sulfonamides status: Secondary | ICD-10-CM | POA: Diagnosis not present

## 2015-08-24 DIAGNOSIS — Z880 Allergy status to penicillin: Secondary | ICD-10-CM | POA: Diagnosis not present

## 2015-08-24 DIAGNOSIS — G35 Multiple sclerosis: Secondary | ICD-10-CM | POA: Diagnosis not present

## 2015-08-24 DIAGNOSIS — Z87891 Personal history of nicotine dependence: Secondary | ICD-10-CM | POA: Diagnosis not present

## 2015-08-24 DIAGNOSIS — J069 Acute upper respiratory infection, unspecified: Secondary | ICD-10-CM | POA: Diagnosis not present

## 2015-08-24 DIAGNOSIS — Z8589 Personal history of malignant neoplasm of other organs and systems: Secondary | ICD-10-CM | POA: Diagnosis not present

## 2015-08-24 DIAGNOSIS — Z885 Allergy status to narcotic agent status: Secondary | ICD-10-CM | POA: Diagnosis not present

## 2015-08-24 DIAGNOSIS — E785 Hyperlipidemia, unspecified: Secondary | ICD-10-CM | POA: Diagnosis not present

## 2015-08-24 DIAGNOSIS — R7303 Prediabetes: Secondary | ICD-10-CM | POA: Diagnosis not present

## 2015-08-24 LAB — PULMONARY FUNCTION TEST

## 2015-08-24 MED ORDER — LEVALBUTEROL HCL 1.25 MG/3ML IN NEBU: 1.2500 mg | INHALATION_SOLUTION | Freq: Four times a day (QID) | RESPIRATORY_TRACT | Status: DC | PRN

## 2015-08-24 MED ORDER — LEVALBUTEROL HCL 1.25 MG/3ML IN NEBU
1.2500 mg | INHALATION_SOLUTION | Freq: Once | RESPIRATORY_TRACT | Status: AC
  Administered 2015-08-24: 1.25 mg via RESPIRATORY_TRACT

## 2015-08-24 MED ORDER — MOMETASONE FUROATE 50 MCG/ACT NA SUSP: 2.0000 | Freq: Every day | NASAL | Status: DC

## 2015-08-24 MED ORDER — DEXAMETHASONE 4 MG PO TABS: ORAL_TABLET | ORAL | Status: DC

## 2015-08-24 NOTE — Progress Notes (Signed)
[place] [address] Dept: [phone]  FOLLOW UP NOTE  Patient ID: Jennifer [name], female    DOB: 1966/05/05  Age: 50 y.o. MRN: [id number] Date of Office Visit: 08/24/2015  Assessment Chief Complaint: Cough and Wheezing  HPI Jennifer [name] presents for treatment for coughing spells for about 4 days. These symptoms began with a slight sore throat. Her husband and her son have been ill with respiratory infections. She is having a clear postnasal drainage. She had a sinus infection about 3 weeks ago treated with clindamycin.. She cannot tolerate prednisone or Medrol but she can tolerate Decadron. She is scheduled for an upper endoscopy and a colonoscopy next month. She has gastroesophageal reflux and has a history of Barrett's esophagus   Current medications are Xopenex 2 puffs every 6 hours if needed, Nasonex 2 sprays per nostril once a day if needed and Robitussin-DM . Her other medications are outlined in the chart and include metoprolol 25 mg every 24 hours and lansoprazole 30 mg once a day   Drug Allergies:  Allergies  Allergen Reactions  . Coreg [Carvedilol] Shortness Of Breath    CP  . Mushroom Extract Complex Anaphylaxis  . Nitrofurantoin Shortness Of Breath    REACTION: sweats  . Promethazine Hcl Anaphylaxis    jittery  . Telmisartan Swelling  . Adhesive [Tape]     EKG monitor patches, some tapes"reddnes,blisters"  . Aspirin Other (See Comments)    flushing  . Atenolol Other (See Comments)    Squeezing chest sensation  . Avelox [Moxifloxacin Hcl In Nacl] Itching and Other (See Comments)    Shortness of breath  . Azithromycin Other (See Comments)    Lip swelling, SOB.   . Beta Adrenergic Blockers     Feels like chest tighting "Metoprolol"  . Butorphanol Tartrate     REACTION: unknown  . Butorphanol Tartrate Other (See Comments)    Patient aggitated  . Cetirizine Other (See Comments)    Broke out in hives the day before, took Zyrtec &  wasn't sure if this made it worse  . Ciprofloxacin     REACTION: tongue swells  . Clonidine Hydrochloride     REACTION: makes blood pressure high  . Cortisone   . Cyprodenate Itching  . Doxycycline   . Fentanyl     [facility] record   . Fluoxetine Hcl Other (See Comments)    REACTION: headaches  . Iron   . Ketorolac Other (See Comments)    I can't sit still  . Ketorolac Tromethamine     jittery  . Lidocaine Other (See Comments)    "It messes me up".  "I can't take it."  . Lisinopril Cough    REACTION: cough  . Metoclopramide Hcl Other (See Comments)    Has a twitchy feeling  . Metoprolol     Other reaction(s): OTHER  . Milk-Related Compounds   . Montelukast Sodium     Unknown"Singulair"  . Naproxen   . Paroxetine Other (See Comments)    REACTION: headaches  . Pravastatin Other (See Comments)    Myalgias  . Promethazine Other (See Comments)    I can't sit still  . Sertraline Hcl     REACTION: headaches  . Spironolactone   . Stelazine [Trifluoperazine]   . Tobramycin     itching , rash  . Trifluoperazine Hcl     REACTION: unknown  . Vancomycin     Other reaction(s): Other (See Comments), Unknown Other  Reaction: all mycins  . Versed [Midazolam]     [facility] medical record  . Ceftriaxone Sodium Rash  . Erythromycin Rash  . Metronidazole Rash  . Penicillins Rash  . Prochlorperazine Anxiety  . Quinolones Rash and Swelling  . Sulfonamide Derivatives Rash  . Venlafaxine Anxiety  . Zyrtec [Cetirizine Hcl] Rash    All over body    Physical Exam: BP 168/100 mmHg  Pulse 106  Temp(Src) 98.7 F (37.1 C) (Oral)  Resp 20  LMP 06/25/2013   Physical Exam  Constitutional: She is oriented to person, place, and time. She appears well-developed and well-nourished.  HENT:  Eyes normal. Ears normal. Nose normal. Pharynx normal.  Neck: Neck supple.  Cardiovascular:  S1 and S2 normal no murmurs  Pulmonary/Chest:  Clear to percussion and  auscultation except for mild wheezing  Lymphadenopathy:    She has no cervical adenopathy.  Neurological: She is alert and oriented to person, place, and time.  Psychiatric: She has a normal mood and affect. Her behavior is normal. Judgment and thought content normal.  Vitals reviewed.   Diagnostics:   FVC 3.09 L FEV1 2.75 L. Predicted FVC 3.29 L predicted FEV1 2.62 L-spirometry in the normal range  Assessment and Plan: 1. Asthma with acute exacerbation, mild intermittent   2. Vocal cord dysfunction   3. Gastroesophageal reflux disease without esophagitis     Meds ordered this encounter  Medications  . levalbuterol (XOPENEX) nebulizer solution 1.25 mg    Sig:   . mometasone (NASONEX) 50 MCG/ACT nasal spray    Sig: Place 2 sprays into the nose daily.    Dispense:  17 g    Refill:  5    FOR STUFFY NOSE OR DRAINAGE  . levalbuterol (XOPENEX) 1.25 MG/3ML nebulizer solution    Sig: Take 1.25 mg by nebulization every 6 (six) hours as needed for wheezing.    Dispense:  90 mL    Refill:  1  . dexamethasone (DECADRON) 4 MG tablet    Sig: TAKE ONE TABLET BY MOUTH TWICE A DAY FOR 4 DAYS, THEN ONE TABLET ON THE FIFTH DAY    Dispense:  20 tablet    Refill:  0    Patient Instructions  Xopenex 2 puffs every 6 hours if needed for coughing or wheezing Nasal saline irrigations at night followed by Nasonex generic 2 sprays per nostril once a day Dexamethasone 4 mg twice a day for 4 days, 4 mg in the fifth day Continue on your other medications Call me if you're not doing better on this treatment plan You have a nebulizer prescribed to use Xopenex 1.25 mg-one unit dose every 6 hours if needed    Return in about 3 months (around 11/22/2015).    Thank you for the opportunity to care for this patient.  Please do not hesitate to contact me with questions.  Penne Lash, M.D.  [facility] [address] [place], [place] [address] [phone]

## 2015-08-24 NOTE — Patient Instructions (Addendum)
Xopenex 2 puffs every 6 hours if needed for coughing or wheezing Nasal saline irrigations at night followed by Nasonex generic 2 sprays per nostril once a day Dexamethasone 4 mg twice a day for 4 days, 4 mg in the fifth day Continue on your other medications Call me if you're not doing better on this treatment plan You have a nebulizer prescribed to use Xopenex 1.25 mg-one unit dose every 6 hours if needed

## 2015-08-25 ENCOUNTER — Telehealth: Payer: Self-pay

## 2015-08-25 DIAGNOSIS — G9619 Other disorders of meninges, not elsewhere classified: Secondary | ICD-10-CM | POA: Diagnosis not present

## 2015-08-25 DIAGNOSIS — J309 Allergic rhinitis, unspecified: Secondary | ICD-10-CM | POA: Diagnosis not present

## 2015-08-25 DIAGNOSIS — M609 Myositis, unspecified: Secondary | ICD-10-CM | POA: Diagnosis not present

## 2015-08-25 DIAGNOSIS — H5000 Unspecified esotropia: Secondary | ICD-10-CM | POA: Diagnosis not present

## 2015-08-25 DIAGNOSIS — F411 Generalized anxiety disorder: Secondary | ICD-10-CM | POA: Diagnosis not present

## 2015-08-25 DIAGNOSIS — E119 Type 2 diabetes mellitus without complications: Secondary | ICD-10-CM | POA: Diagnosis not present

## 2015-08-25 DIAGNOSIS — N760 Acute vaginitis: Secondary | ICD-10-CM | POA: Diagnosis not present

## 2015-08-25 DIAGNOSIS — E873 Alkalosis: Secondary | ICD-10-CM | POA: Diagnosis not present

## 2015-08-25 DIAGNOSIS — D320 Benign neoplasm of cerebral meninges: Secondary | ICD-10-CM | POA: Diagnosis not present

## 2015-08-25 DIAGNOSIS — R42 Dizziness and giddiness: Secondary | ICD-10-CM | POA: Diagnosis not present

## 2015-08-25 DIAGNOSIS — F341 Dysthymic disorder: Secondary | ICD-10-CM | POA: Diagnosis not present

## 2015-08-25 DIAGNOSIS — J4521 Mild intermittent asthma with (acute) exacerbation: Secondary | ICD-10-CM

## 2015-08-25 DIAGNOSIS — R1310 Dysphagia, unspecified: Secondary | ICD-10-CM | POA: Diagnosis not present

## 2015-08-25 DIAGNOSIS — D509 Iron deficiency anemia, unspecified: Secondary | ICD-10-CM | POA: Diagnosis not present

## 2015-08-25 DIAGNOSIS — R05 Cough: Secondary | ICD-10-CM | POA: Diagnosis not present

## 2015-08-25 DIAGNOSIS — J42 Unspecified chronic bronchitis: Secondary | ICD-10-CM | POA: Diagnosis not present

## 2015-08-25 DIAGNOSIS — I10 Essential (primary) hypertension: Secondary | ICD-10-CM | POA: Diagnosis not present

## 2015-08-25 DIAGNOSIS — K220 Achalasia of cardia: Secondary | ICD-10-CM | POA: Diagnosis not present

## 2015-08-25 DIAGNOSIS — R0600 Dyspnea, unspecified: Secondary | ICD-10-CM | POA: Diagnosis not present

## 2015-08-25 DIAGNOSIS — Z131 Encounter for screening for diabetes mellitus: Secondary | ICD-10-CM | POA: Diagnosis not present

## 2015-08-25 DIAGNOSIS — R12 Heartburn: Secondary | ICD-10-CM | POA: Diagnosis not present

## 2015-08-25 DIAGNOSIS — K2270 Barrett's esophagus without dysplasia: Secondary | ICD-10-CM | POA: Diagnosis not present

## 2015-08-25 DIAGNOSIS — F319 Bipolar disorder, unspecified: Secondary | ICD-10-CM | POA: Diagnosis not present

## 2015-08-25 DIAGNOSIS — Q078 Other specified congenital malformations of nervous system: Secondary | ICD-10-CM | POA: Diagnosis not present

## 2015-08-25 DIAGNOSIS — J3501 Chronic tonsillitis: Secondary | ICD-10-CM | POA: Diagnosis not present

## 2015-08-25 DIAGNOSIS — C549 Malignant neoplasm of corpus uteri, unspecified: Secondary | ICD-10-CM | POA: Diagnosis not present

## 2015-08-25 DIAGNOSIS — H532 Diplopia: Secondary | ICD-10-CM | POA: Diagnosis not present

## 2015-08-25 MED ORDER — ALBUTEROL SULFATE (2.5 MG/3ML) 0.083% IN NEBU: INHALATION_SOLUTION | RESPIRATORY_TRACT | Status: DC

## 2015-08-25 NOTE — Telephone Encounter (Signed)
Spoke with Jennifer [name].  Per [facility], levalbuterol 1.25 neb sol is not covered under pt Medicare part D. It is covered under her Medicare part B.  Cost is $50 for 4 boxes.  Per pt, she cannot afford to pay for this and wants [place] to call in albuterol 0.083% neb solution.  Patient states "I would rather deal with a fast heart rate than not be able to breathe."  I called Medicare to see if a PA could be initiated.  Medicare states the medication is not covered under part D unless pt is in a long term care facility.  It is covered under Medicare B and no further PA can be filed. I called [facility] and spoke with Yomi, the pharmacist. Verbally called in albuterol 0.083% unit dose ampules, 1 ampule in nebulizer every 4 hours prn cough or wheeze. We were also required to fax in RX with diagnosis code J45.21. Called Jennifer [name] back to inform of albuterol called in to [facility].  [name] informed.

## 2015-08-26 NOTE — Telephone Encounter (Signed)
Message above by Dr.Bardelas read.

## 2015-08-26 NOTE — Telephone Encounter (Signed)
Agree with plan 

## 2015-08-27 ENCOUNTER — Emergency Department (HOSPITAL_BASED_OUTPATIENT_CLINIC_OR_DEPARTMENT_OTHER)
Admission: EM | Admit: 2015-08-27 | Discharge: 2015-08-27 | Disposition: A | Discharge status: Home / Self Care | Payer: Medicare Other | Attending: Emergency Medicine | Admitting: Emergency Medicine

## 2015-08-27 ENCOUNTER — Emergency Department (HOSPITAL_BASED_OUTPATIENT_CLINIC_OR_DEPARTMENT_OTHER): Payer: Medicare Other

## 2015-08-27 ENCOUNTER — Encounter (HOSPITAL_BASED_OUTPATIENT_CLINIC_OR_DEPARTMENT_OTHER): Payer: Self-pay | Admitting: *Deleted

## 2015-08-27 DIAGNOSIS — Z862 Personal history of diseases of the blood and blood-forming organs and certain disorders involving the immune mechanism: Secondary | ICD-10-CM | POA: Insufficient documentation

## 2015-08-27 DIAGNOSIS — Z8659 Personal history of other mental and behavioral disorders: Secondary | ICD-10-CM | POA: Insufficient documentation

## 2015-08-27 DIAGNOSIS — I10 Essential (primary) hypertension: Secondary | ICD-10-CM | POA: Diagnosis not present

## 2015-08-27 DIAGNOSIS — R05 Cough: Secondary | ICD-10-CM | POA: Diagnosis not present

## 2015-08-27 DIAGNOSIS — Z87891 Personal history of nicotine dependence: Secondary | ICD-10-CM | POA: Diagnosis not present

## 2015-08-27 DIAGNOSIS — K219 Gastro-esophageal reflux disease without esophagitis: Secondary | ICD-10-CM | POA: Diagnosis not present

## 2015-08-27 DIAGNOSIS — Z8614 Personal history of Methicillin resistant Staphylococcus aureus infection: Secondary | ICD-10-CM | POA: Diagnosis not present

## 2015-08-27 DIAGNOSIS — G4730 Sleep apnea, unspecified: Secondary | ICD-10-CM | POA: Insufficient documentation

## 2015-08-27 DIAGNOSIS — Z880 Allergy status to penicillin: Secondary | ICD-10-CM | POA: Diagnosis not present

## 2015-08-27 DIAGNOSIS — Z8542 Personal history of malignant neoplasm of other parts of uterus: Secondary | ICD-10-CM | POA: Insufficient documentation

## 2015-08-27 DIAGNOSIS — J45901 Unspecified asthma with (acute) exacerbation: Secondary | ICD-10-CM | POA: Insufficient documentation

## 2015-08-27 DIAGNOSIS — Z85828 Personal history of other malignant neoplasm of skin: Secondary | ICD-10-CM | POA: Diagnosis not present

## 2015-08-27 DIAGNOSIS — Z7951 Long term (current) use of inhaled steroids: Secondary | ICD-10-CM | POA: Insufficient documentation

## 2015-08-27 DIAGNOSIS — J069 Acute upper respiratory infection, unspecified: Secondary | ICD-10-CM | POA: Diagnosis not present

## 2015-08-27 DIAGNOSIS — Z79899 Other long term (current) drug therapy: Secondary | ICD-10-CM | POA: Diagnosis not present

## 2015-08-27 DIAGNOSIS — Z9981 Dependence on supplemental oxygen: Secondary | ICD-10-CM | POA: Diagnosis not present

## 2015-08-27 DIAGNOSIS — G8929 Other chronic pain: Secondary | ICD-10-CM | POA: Diagnosis not present

## 2015-08-27 DIAGNOSIS — R0602 Shortness of breath: Secondary | ICD-10-CM | POA: Diagnosis present

## 2015-08-27 DIAGNOSIS — J45909 Unspecified asthma, uncomplicated: Secondary | ICD-10-CM | POA: Diagnosis not present

## 2015-08-27 LAB — CBC WITH DIFFERENTIAL/PLATELET
Basophils Absolute: 0 10*3/uL (ref 0.0–0.1)
Basophils Relative: 0 %
Eosinophils Absolute: 0 10*3/uL (ref 0.0–0.7)
Eosinophils Relative: 0 %
HCT: 45.6 % (ref 36.0–46.0)
Hemoglobin: 14.9 g/dL (ref 12.0–15.0)
Lymphocytes Relative: 32 %
Lymphs Abs: 2.7 10*3/uL (ref 0.7–4.0)
MCH: 29.9 pg (ref 26.0–34.0)
MCHC: 32.7 g/dL (ref 30.0–36.0)
MCV: 91.4 fL (ref 78.0–100.0)
Monocytes Absolute: 0.3 10*3/uL (ref 0.1–1.0)
Monocytes Relative: 3 %
Myelocytes: 2 %
Neutro Abs: 5.4 10*3/uL (ref 1.7–7.7)
Neutrophils Relative %: 63 %
Platelets: 193 10*3/uL (ref 150–400)
RBC: 4.99 MIL/uL (ref 3.87–5.11)
RDW: 13.8 % (ref 11.5–15.5)
WBC: 8.4 10*3/uL (ref 4.0–10.5)

## 2015-08-27 LAB — BASIC METABOLIC PANEL
Anion gap: 10 (ref 5–15)
BUN: 17 mg/dL (ref 6–20)
CO2: 22 mmol/L (ref 22–32)
Calcium: 8.9 mg/dL (ref 8.9–10.3)
Chloride: 111 mmol/L (ref 101–111)
Creatinine, Ser: 0.66 mg/dL (ref 0.44–1.00)
GFR calc Af Amer: >60 mL/min (ref >60)
GFR calc non Af Amer: >60 mL/min (ref >60)
Glucose, Bld: 89 mg/dL (ref 65–99)
Potassium: 3.1 mmol/L — ABNORMAL LOW (ref 3.5–5.1)
Sodium: 143 mmol/L (ref 135–145)

## 2015-08-27 MED ORDER — POTASSIUM CHLORIDE CRYS ER 20 MEQ PO TBCR
40.0000 meq | EXTENDED_RELEASE_TABLET | Freq: Once | ORAL | Status: DC
  Filled 2015-08-27: qty 2

## 2015-08-27 MED ORDER — POTASSIUM CHLORIDE 20 MEQ PO PACK
40.0000 meq | PACK | Freq: Two times a day (BID) | ORAL | Status: DC
  Filled 2015-08-27: qty 2

## 2015-08-27 MED ORDER — POTASSIUM CHLORIDE 20 MEQ/15ML (10%) PO SOLN
ORAL | Status: AC
  Administered 2015-08-27: 40 meq
  Filled 2015-08-27: qty 30

## 2015-08-27 MED ORDER — IPRATROPIUM-ALBUTEROL 0.5-2.5 (3) MG/3ML IN SOLN
3.0000 mL | Freq: Four times a day (QID) | RESPIRATORY_TRACT | Status: DC
  Administered 2015-08-27: 3 mL via RESPIRATORY_TRACT
  Filled 2015-08-27: qty 3

## 2015-08-27 MED ORDER — ALBUTEROL SULFATE (2.5 MG/3ML) 0.083% IN NEBU
2.5000 mg | INHALATION_SOLUTION | Freq: Once | RESPIRATORY_TRACT | Status: AC
  Administered 2015-08-27: 2.5 mg via RESPIRATORY_TRACT
  Filled 2015-08-27: qty 3

## 2015-08-27 MED ORDER — DEXAMETHASONE SODIUM PHOSPHATE 10 MG/ML IJ SOLN
10.0000 mg | Freq: Once | INTRAMUSCULAR | Status: AC
  Administered 2015-08-27: 10 mg via INTRAVENOUS
  Filled 2015-08-27: qty 1

## 2015-08-27 NOTE — ED Notes (Signed)
MD at bedside. 
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
## 2015-08-27 NOTE — ED Notes (Signed)
Pt has been using her prescription medication, cough syrup, throat drops, and Vicks vapor rub. Pt became tearful in regards to lack of sleeping and fear of having pneumonia.

## 2015-08-27 NOTE — Discharge Instructions (Signed)
You were seen today for your continued cough as well as not feeling well. This appears to be secondary to a continued viral infection and your asthma. Continue to take the steroids and breathing treatments as directed by your asthma doctor. Try to rest at home. Drink lots of fluids to keep herself well-hydrated. Follow-up with your primary care physician next week.  Upper Respiratory Infection, Adult Most upper respiratory infections (URIs) are a viral infection of the air passages leading to the lungs. A URI affects the nose, throat, and upper air passages. The most common type of URI is nasopharyngitis and is typically referred to as "the common cold." URIs run their course and usually go away on their own. Most of the time, a URI does not require medical attention, but sometimes a bacterial infection in the upper airways can follow a viral infection. This is called a secondary infection. Sinus and middle ear infections are common types of secondary upper respiratory infections. Bacterial pneumonia can also complicate a URI. A URI can worsen asthma and chronic obstructive pulmonary disease (COPD). Sometimes, these complications can require emergency medical care and may be life threatening.  CAUSES Almost all URIs are caused by viruses. A virus is a type of germ and can spread from one person to another.  RISKS FACTORS You may be at risk for a URI if:   You smoke.   You have chronic heart or lung disease.  You have a weakened defense (immune) system.   You are very young or very old.   You have nasal allergies or asthma.  You work in crowded or poorly ventilated areas.  You work in health care facilities or schools. SIGNS AND SYMPTOMS  Symptoms typically develop 2-3 days after you come in contact with a cold virus. Most viral URIs last 7-10 days. However, viral URIs from the influenza virus (flu virus) can last 14-18 days and are typically more severe. Symptoms may include:   Runny or  stuffy (congested) nose.   Sneezing.   Cough.   Sore throat.   Headache.   Fatigue.   Fever.   Loss of appetite.   Pain in your forehead, behind your eyes, and over your cheekbones (sinus pain).  Muscle aches.  DIAGNOSIS  Your health care provider may diagnose a URI by:  Physical exam.  Tests to check that your symptoms are not due to another condition such as:  Strep throat.  Sinusitis.  Pneumonia.  Asthma. TREATMENT  A URI goes away on its own with time. It cannot be cured with medicines, but medicines may be prescribed or recommended to relieve symptoms. Medicines may help:  Reduce your fever.  Reduce your cough.  Relieve nasal congestion. HOME CARE INSTRUCTIONS   Take medicines only as directed by your health care provider.   Gargle warm saltwater or take cough drops to comfort your throat as directed by your health care provider.  Use a warm mist humidifier or inhale steam from a shower to increase air moisture. This may make it easier to breathe.  Drink enough fluid to keep your urine clear or pale yellow.   Eat soups and other clear broths and maintain good nutrition.   Rest as needed.   Return to work when your temperature has returned to normal or as your health care provider advises. You may need to stay home longer to avoid infecting others. You can also use a face mask and careful hand washing to prevent spread of the virus.  Increase the usage of your inhaler if you have asthma.   Do not use any tobacco products, including cigarettes, chewing tobacco, or electronic cigarettes. If you need help quitting, ask your health care provider. PREVENTION  The best way to protect yourself from getting a cold is to practice good hygiene.   Avoid oral or hand contact with people with cold symptoms.   Wash your hands often if contact occurs.  There is no clear evidence that vitamin C, vitamin E, echinacea, or exercise reduces the chance  of developing a cold. However, it is always recommended to get plenty of rest, exercise, and practice good nutrition.  SEEK MEDICAL CARE IF:   You are getting worse rather than better.   Your symptoms are not controlled by medicine.   You have chills.  You have worsening shortness of breath.  You have brown or red mucus.  You have yellow or brown nasal discharge.  You have pain in your face, especially when you bend forward.  You have a fever.  You have swollen neck glands.  You have pain while swallowing.  You have white areas in the back of your throat. SEEK IMMEDIATE MEDICAL CARE IF:   You have severe or persistent:  Headache.  Ear pain.  Sinus pain.  Chest pain.  You have chronic lung disease and any of the following:  Wheezing.  Prolonged cough.  Coughing up blood.  A change in your usual mucus.  You have a stiff neck.  You have changes in your:  Vision.  Hearing.  Thinking.  Mood. MAKE SURE YOU:   Understand these instructions.  Will watch your condition.  Will get help right away if you are not doing well or get worse.   This information is not intended to replace advice given to you by your health care provider. Make sure you discuss any questions you have with your health care provider.   Document Released: 01/24/2001 Document Revised: 12/15/2014 Document Reviewed: 11/05/2013 Elsevier Interactive Patient Education [employer].

## 2015-08-27 NOTE — ED Notes (Signed)
Cough. She has been using a neb machine and is taking steroids with no relief.

## 2015-08-29 DIAGNOSIS — J45909 Unspecified asthma, uncomplicated: Secondary | ICD-10-CM | POA: Diagnosis not present

## 2015-08-29 DIAGNOSIS — B9789 Other viral agents as the cause of diseases classified elsewhere: Secondary | ICD-10-CM | POA: Diagnosis not present

## 2015-08-29 DIAGNOSIS — F329 Major depressive disorder, single episode, unspecified: Secondary | ICD-10-CM | POA: Diagnosis not present

## 2015-08-29 DIAGNOSIS — I10 Essential (primary) hypertension: Secondary | ICD-10-CM | POA: Diagnosis not present

## 2015-08-29 DIAGNOSIS — F4310 Post-traumatic stress disorder, unspecified: Secondary | ICD-10-CM | POA: Diagnosis not present

## 2015-08-29 DIAGNOSIS — J069 Acute upper respiratory infection, unspecified: Secondary | ICD-10-CM | POA: Diagnosis not present

## 2015-08-29 DIAGNOSIS — Z79899 Other long term (current) drug therapy: Secondary | ICD-10-CM | POA: Diagnosis not present

## 2015-08-30 DIAGNOSIS — R569 Unspecified convulsions: Secondary | ICD-10-CM | POA: Diagnosis not present

## 2015-08-30 DIAGNOSIS — R110 Nausea: Secondary | ICD-10-CM | POA: Diagnosis not present

## 2015-08-30 DIAGNOSIS — R05 Cough: Secondary | ICD-10-CM | POA: Diagnosis not present

## 2015-08-30 DIAGNOSIS — R531 Weakness: Secondary | ICD-10-CM | POA: Diagnosis not present

## 2015-08-30 DIAGNOSIS — J45909 Unspecified asthma, uncomplicated: Secondary | ICD-10-CM | POA: Diagnosis not present

## 2015-08-30 DIAGNOSIS — I10 Essential (primary) hypertension: Secondary | ICD-10-CM | POA: Diagnosis not present

## 2015-08-30 DIAGNOSIS — F4310 Post-traumatic stress disorder, unspecified: Secondary | ICD-10-CM | POA: Diagnosis not present

## 2015-08-30 DIAGNOSIS — G8929 Other chronic pain: Secondary | ICD-10-CM | POA: Diagnosis not present

## 2015-08-30 DIAGNOSIS — G35 Multiple sclerosis: Secondary | ICD-10-CM | POA: Diagnosis not present

## 2015-08-30 DIAGNOSIS — D649 Anemia, unspecified: Secondary | ICD-10-CM | POA: Diagnosis not present

## 2015-08-30 DIAGNOSIS — R1010 Upper abdominal pain, unspecified: Secondary | ICD-10-CM | POA: Diagnosis not present

## 2015-08-30 DIAGNOSIS — R0781 Pleurodynia: Secondary | ICD-10-CM | POA: Diagnosis not present

## 2015-08-30 DIAGNOSIS — R42 Dizziness and giddiness: Secondary | ICD-10-CM | POA: Diagnosis not present

## 2015-08-30 DIAGNOSIS — Z87891 Personal history of nicotine dependence: Secondary | ICD-10-CM | POA: Diagnosis not present

## 2015-08-30 DIAGNOSIS — J040 Acute laryngitis: Secondary | ICD-10-CM | POA: Diagnosis not present

## 2015-08-30 DIAGNOSIS — R0602 Shortness of breath: Secondary | ICD-10-CM | POA: Diagnosis not present

## 2015-08-30 DIAGNOSIS — G4733 Obstructive sleep apnea (adult) (pediatric): Secondary | ICD-10-CM | POA: Diagnosis not present

## 2015-08-30 IMAGING — CR DG CHEST 2V
2 series · 2 of 2 positions shown · non-contrast
Comparison: Chest radiograph 05/22/2014

CLINICAL DATA: Productive cough for 1 week was shortness of breath.
Central chest pain 2 days ago.

EXAM:
CHEST  2 VIEW

[w chest pa]
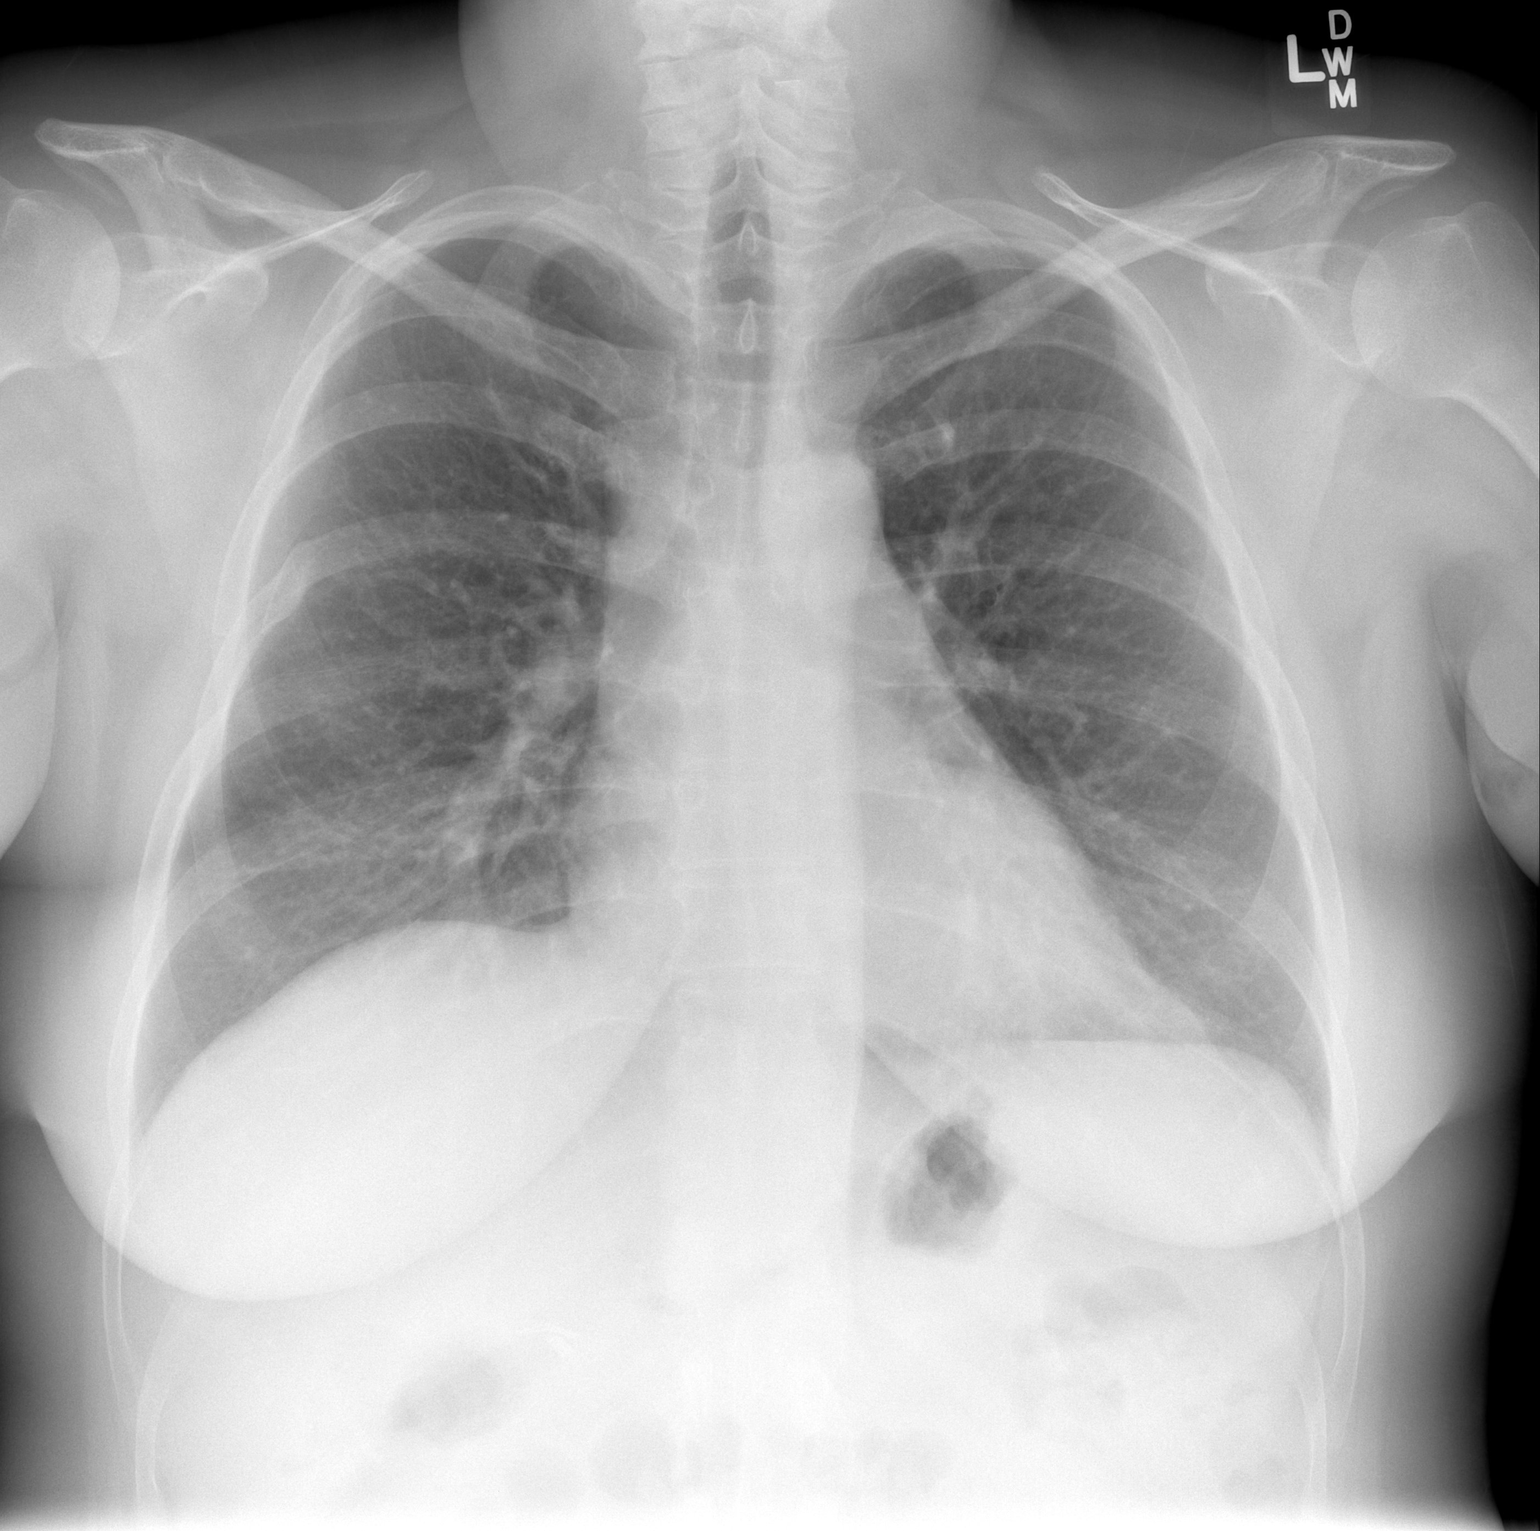

[w chest lat]
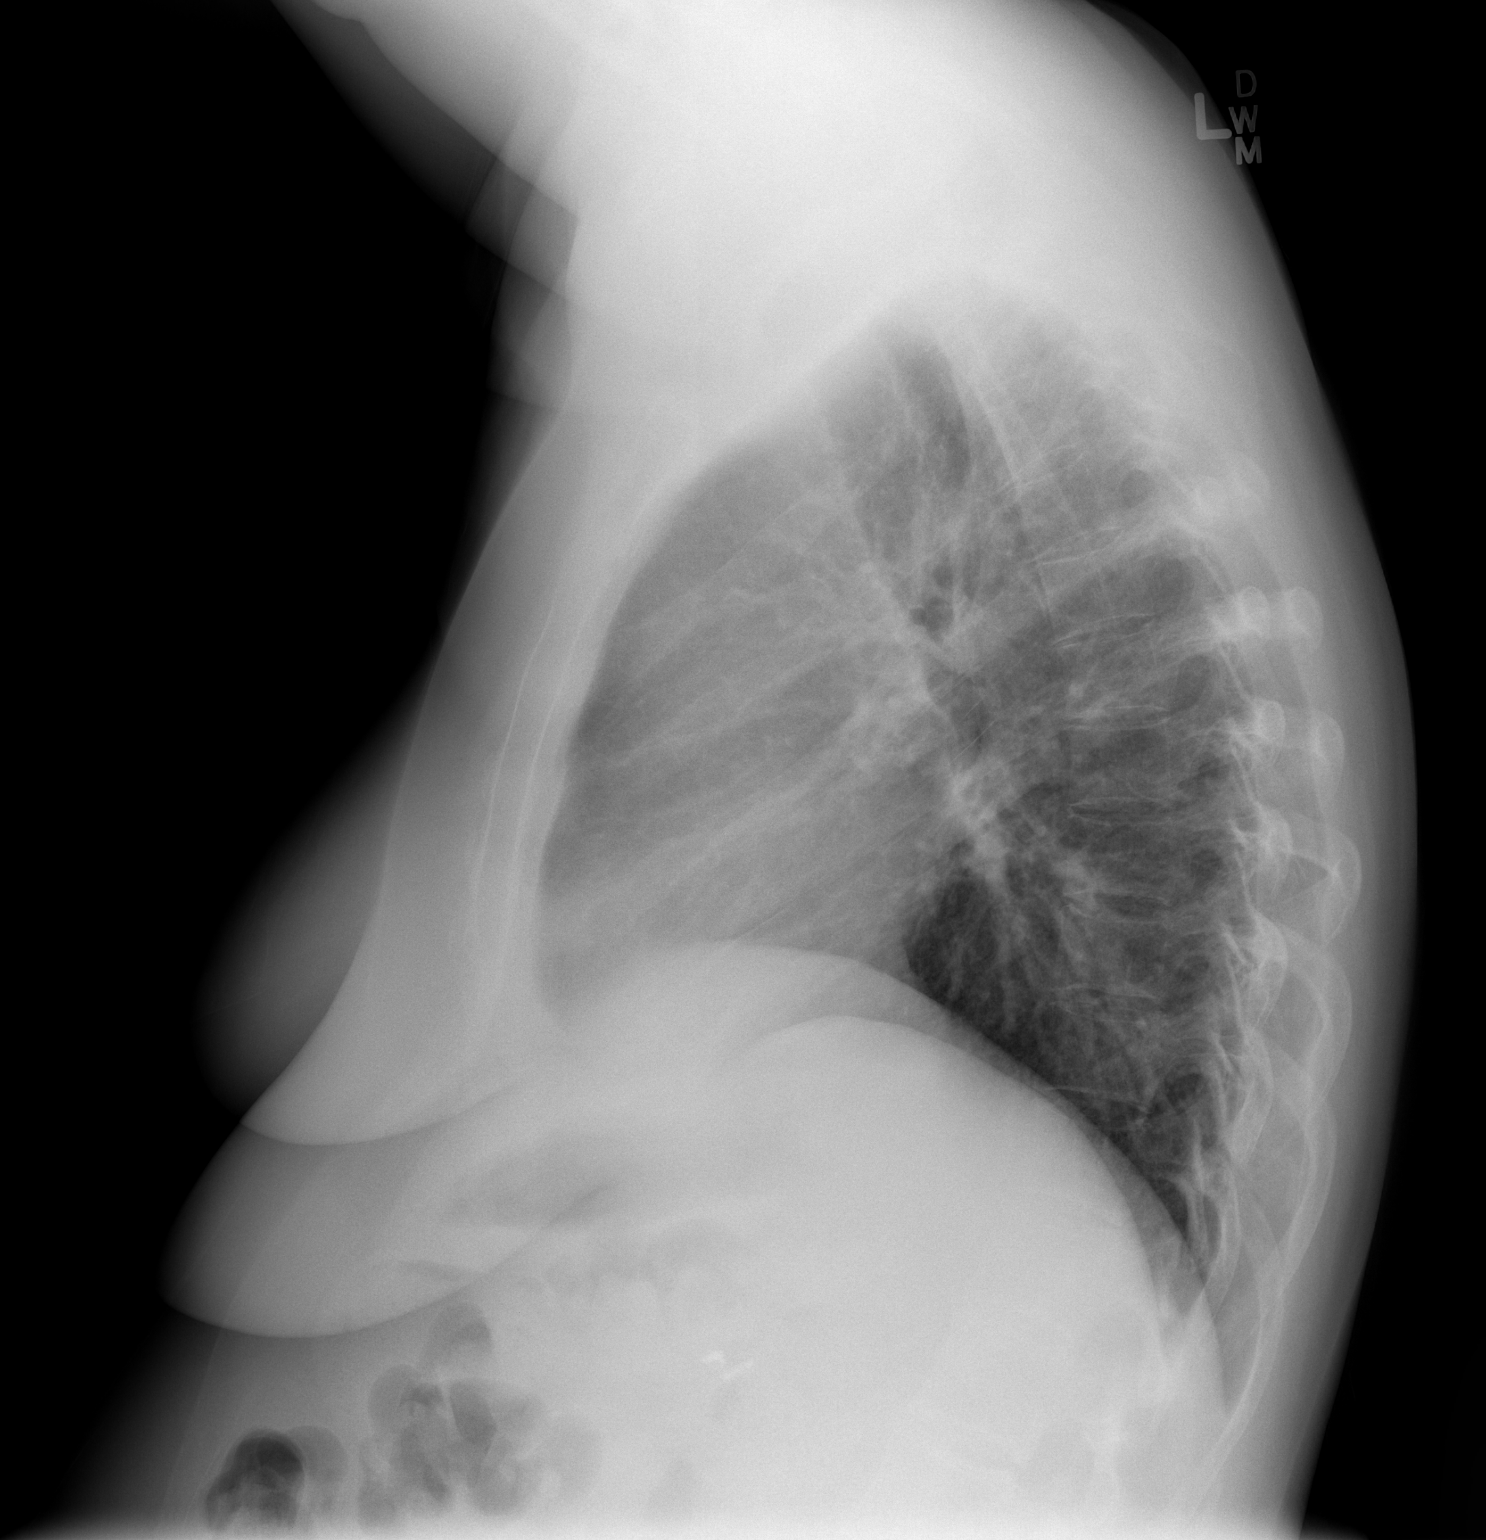

[2 of 2 positions shown; findings below may reference images not displayed]

FINDINGS: Heart, mediastinal, and hilar contours and pulmonary vascularity are
normal. The lungs are normally expanded and clear. Remote healed
fracture of the right sixth rib is noted. No acute bony abnormality
is appreciated. Visualized upper abdomen is normal. Cholecystectomy
clips present.
IMPRESSION: No active cardiopulmonary disease.

## 2015-08-31 ENCOUNTER — Ambulatory Visit: Payer: Self-pay | Admitting: Family Medicine

## 2015-09-01 ENCOUNTER — Emergency Department (HOSPITAL_BASED_OUTPATIENT_CLINIC_OR_DEPARTMENT_OTHER): Payer: Medicare Other

## 2015-09-01 ENCOUNTER — Other Ambulatory Visit: Payer: Self-pay

## 2015-09-01 ENCOUNTER — Encounter (HOSPITAL_BASED_OUTPATIENT_CLINIC_OR_DEPARTMENT_OTHER): Payer: Self-pay

## 2015-09-01 ENCOUNTER — Emergency Department (HOSPITAL_BASED_OUTPATIENT_CLINIC_OR_DEPARTMENT_OTHER)
Admission: EM | Admit: 2015-09-01 | Discharge: 2015-09-01 | Disposition: A | Discharge status: Home / Self Care | Payer: Medicare Other | Attending: Emergency Medicine | Admitting: Emergency Medicine

## 2015-09-01 DIAGNOSIS — R5383 Other fatigue: Secondary | ICD-10-CM | POA: Diagnosis not present

## 2015-09-01 DIAGNOSIS — J45901 Unspecified asthma with (acute) exacerbation: Secondary | ICD-10-CM | POA: Diagnosis not present

## 2015-09-01 DIAGNOSIS — Z79899 Other long term (current) drug therapy: Secondary | ICD-10-CM | POA: Insufficient documentation

## 2015-09-01 DIAGNOSIS — Z8614 Personal history of Methicillin resistant Staphylococcus aureus infection: Secondary | ICD-10-CM | POA: Insufficient documentation

## 2015-09-01 DIAGNOSIS — R000 Tachycardia, unspecified: Secondary | ICD-10-CM | POA: Diagnosis not present

## 2015-09-01 DIAGNOSIS — G8929 Other chronic pain: Secondary | ICD-10-CM | POA: Insufficient documentation

## 2015-09-01 DIAGNOSIS — R42 Dizziness and giddiness: Secondary | ICD-10-CM | POA: Diagnosis not present

## 2015-09-01 DIAGNOSIS — Z880 Allergy status to penicillin: Secondary | ICD-10-CM | POA: Insufficient documentation

## 2015-09-01 DIAGNOSIS — Z87891 Personal history of nicotine dependence: Secondary | ICD-10-CM | POA: Diagnosis not present

## 2015-09-01 DIAGNOSIS — R079 Chest pain, unspecified: Secondary | ICD-10-CM | POA: Diagnosis not present

## 2015-09-01 DIAGNOSIS — J069 Acute upper respiratory infection, unspecified: Secondary | ICD-10-CM | POA: Diagnosis not present

## 2015-09-01 DIAGNOSIS — R531 Weakness: Secondary | ICD-10-CM | POA: Insufficient documentation

## 2015-09-01 DIAGNOSIS — R630 Anorexia: Secondary | ICD-10-CM | POA: Insufficient documentation

## 2015-09-01 DIAGNOSIS — I10 Essential (primary) hypertension: Secondary | ICD-10-CM | POA: Insufficient documentation

## 2015-09-01 DIAGNOSIS — Z7951 Long term (current) use of inhaled steroids: Secondary | ICD-10-CM | POA: Diagnosis not present

## 2015-09-01 DIAGNOSIS — Z8542 Personal history of malignant neoplasm of other parts of uterus: Secondary | ICD-10-CM | POA: Insufficient documentation

## 2015-09-01 DIAGNOSIS — R0789 Other chest pain: Secondary | ICD-10-CM | POA: Diagnosis not present

## 2015-09-01 DIAGNOSIS — R05 Cough: Secondary | ICD-10-CM | POA: Diagnosis not present

## 2015-09-01 LAB — BASIC METABOLIC PANEL
Anion gap: 9 (ref 5–15)
BUN: 15 mg/dL (ref 6–20)
CO2: 25 mmol/L (ref 22–32)
Calcium: 8.2 mg/dL — ABNORMAL LOW (ref 8.9–10.3)
Chloride: 107 mmol/L (ref 101–111)
Creatinine, Ser: 0.6 mg/dL (ref 0.44–1.00)
GFR calc Af Amer: >60 mL/min (ref >60)
GFR calc non Af Amer: >60 mL/min (ref >60)
Glucose, Bld: 143 mg/dL — ABNORMAL HIGH (ref 65–99)
Potassium: 3.3 mmol/L — ABNORMAL LOW (ref 3.5–5.1)
Sodium: 141 mmol/L (ref 135–145)

## 2015-09-01 LAB — CBC WITH DIFFERENTIAL/PLATELET
Basophils Absolute: 0 10*3/uL (ref 0.0–0.1)
Basophils Relative: 0 %
Eosinophils Absolute: 0.2 10*3/uL (ref 0.0–0.7)
Eosinophils Relative: 2 %
HCT: 42.5 % (ref 36.0–46.0)
Hemoglobin: 14 g/dL (ref 12.0–15.0)
Lymphocytes Relative: 23 %
Lymphs Abs: 1.7 10*3/uL (ref 0.7–4.0)
MCH: 30 pg (ref 26.0–34.0)
MCHC: 32.9 g/dL (ref 30.0–36.0)
MCV: 91.2 fL (ref 78.0–100.0)
Monocytes Absolute: 0.6 10*3/uL (ref 0.1–1.0)
Monocytes Relative: 9 %
Neutro Abs: 4.9 10*3/uL (ref 1.7–7.7)
Neutrophils Relative %: 66 %
Platelets: 184 10*3/uL (ref 150–400)
RBC: 4.66 MIL/uL (ref 3.87–5.11)
RDW: 14 % (ref 11.5–15.5)
WBC: 7.5 10*3/uL (ref 4.0–10.5)

## 2015-09-01 LAB — TROPONIN I: Troponin I: <0.03 ng/mL (ref <0.031)

## 2015-09-01 LAB — BRAIN NATRIURETIC PEPTIDE: B Natriuretic Peptide: 22.4 pg/mL (ref 0.0–100.0)

## 2015-09-01 LAB — D-DIMER, QUANTITATIVE: D-Dimer, Quant: <0.27 ug/mL-FEU (ref 0.00–0.50)

## 2015-09-01 MED ORDER — POTASSIUM CHLORIDE 20 MEQ/15ML (10%) PO SOLN
40.0000 meq | Freq: Once | ORAL | Status: AC
  Administered 2015-09-01: 40 meq via ORAL
  Filled 2015-09-01: qty 30

## 2015-09-01 MED ORDER — ALBUTEROL SULFATE HFA 108 (90 BASE) MCG/ACT IN AERS
1.0000 | INHALATION_SPRAY | Freq: Four times a day (QID) | RESPIRATORY_TRACT | Status: DC | PRN
Start: 2015-09-01 — End: 2015-09-14

## 2015-09-01 MED ORDER — BENZONATATE 100 MG PO CAPS: 100.0000 mg | ORAL_CAPSULE | Freq: Three times a day (TID) | ORAL | Status: DC

## 2015-09-01 MED ORDER — IPRATROPIUM-ALBUTEROL 0.5-2.5 (3) MG/3ML IN SOLN
3.0000 mL | Freq: Four times a day (QID) | RESPIRATORY_TRACT | Status: DC
  Administered 2015-09-01: 3 mL via RESPIRATORY_TRACT
  Filled 2015-09-01: qty 3

## 2015-09-01 MED ORDER — POTASSIUM CHLORIDE CRYS ER 20 MEQ PO TBCR
40.0000 meq | EXTENDED_RELEASE_TABLET | Freq: Once | ORAL | Status: DC
  Filled 2015-09-01: qty 2

## 2015-09-01 MED ORDER — DEXAMETHASONE SODIUM PHOSPHATE 10 MG/ML IJ SOLN
10.0000 mg | Freq: Once | INTRAMUSCULAR | Status: AC
  Administered 2015-09-01: 10 mg via INTRAVENOUS
  Filled 2015-09-01: qty 1

## 2015-09-01 MED ORDER — SODIUM CHLORIDE 0.9 % IV BOLUS (SEPSIS)
1000.0000 mL | Freq: Once | INTRAVENOUS | Status: AC
  Administered 2015-09-01: 1000 mL via INTRAVENOUS

## 2015-09-01 NOTE — ED Notes (Addendum)
Pt reports RUQ pain, intermittent chest pain, and feels like "my heart is racy." PT reports she has not taken her metoprolol or her potassium.

## 2015-09-01 NOTE — ED Notes (Signed)
MD at bedside. 

## 2015-09-01 NOTE — Progress Notes (Signed)
Pt walked from room to bathroom with pulse oximetry. He Sp02 remained 100%, HR 102-112.

## 2015-09-01 NOTE — Discharge Instructions (Signed)
Upper Respiratory Infection, Adult There is no evidence of heart attack or blood clot in the lung. Follow up with your doctor. Return to the ED if you develop new or worsening symptoms. Most upper respiratory infections (URIs) are a viral infection of the air passages leading to the lungs. A URI affects the nose, throat, and upper air passages. The most common type of URI is nasopharyngitis and is typically referred to as "the common cold." URIs run their course and usually go away on their own. Most of the time, a URI does not require medical attention, but sometimes a bacterial infection in the upper airways can follow a viral infection. This is called a secondary infection. Sinus and middle ear infections are common types of secondary upper respiratory infections. Bacterial pneumonia can also complicate a URI. A URI can worsen asthma and chronic obstructive pulmonary disease (COPD). Sometimes, these complications can require emergency medical care and may be life threatening.  CAUSES Almost all URIs are caused by viruses. A virus is a type of germ and can spread from one person to another.  RISKS FACTORS You may be at risk for a URI if:   You smoke.   You have chronic heart or lung disease.  You have a weakened defense (immune) system.   You are very young or very old.   You have nasal allergies or asthma.  You work in crowded or poorly ventilated areas.  You work in health care facilities or schools. SIGNS AND SYMPTOMS  Symptoms typically develop 2-3 days after you come in contact with a cold virus. Most viral URIs last 7-10 days. However, viral URIs from the influenza virus (flu virus) can last 14-18 days and are typically more severe. Symptoms may include:   Runny or stuffy (congested) nose.   Sneezing.   Cough.   Sore throat.   Headache.   Fatigue.   Fever.   Loss of appetite.   Pain in your forehead, behind your eyes, and over your cheekbones (sinus  pain).  Muscle aches.  DIAGNOSIS  Your health care provider may diagnose a URI by:  Physical exam.  Tests to check that your symptoms are not due to another condition such as:  Strep throat.  Sinusitis.  Pneumonia.  Asthma. TREATMENT  A URI goes away on its own with time. It cannot be cured with medicines, but medicines may be prescribed or recommended to relieve symptoms. Medicines may help:  Reduce your fever.  Reduce your cough.  Relieve nasal congestion. HOME CARE INSTRUCTIONS   Take medicines only as directed by your health care provider.   Gargle warm saltwater or take cough drops to comfort your throat as directed by your health care provider.  Use a warm mist humidifier or inhale steam from a shower to increase air moisture. This may make it easier to breathe.  Drink enough fluid to keep your urine clear or pale yellow.   Eat soups and other clear broths and maintain good nutrition.   Rest as needed.   Return to work when your temperature has returned to normal or as your health care provider advises. You may need to stay home longer to avoid infecting others. You can also use a face mask and careful hand washing to prevent spread of the virus.  Increase the usage of your inhaler if you have asthma.   Do not use any tobacco products, including cigarettes, chewing tobacco, or electronic cigarettes. If you need help quitting, ask your health care  provider. PREVENTION  The best way to protect yourself from getting a cold is to practice good hygiene.   Avoid oral or hand contact with people with cold symptoms.   Wash your hands often if contact occurs.  There is no clear evidence that vitamin C, vitamin E, echinacea, or exercise reduces the chance of developing a cold. However, it is always recommended to get plenty of rest, exercise, and practice good nutrition.  SEEK MEDICAL CARE IF:   You are getting worse rather than better.   Your symptoms are  not controlled by medicine.   You have chills.  You have worsening shortness of breath.  You have brown or red mucus.  You have yellow or brown nasal discharge.  You have pain in your face, especially when you bend forward.  You have a fever.  You have swollen neck glands.  You have pain while swallowing.  You have white areas in the back of your throat. SEEK IMMEDIATE MEDICAL CARE IF:   You have severe or persistent:  Headache.  Ear pain.  Sinus pain.  Chest pain.  You have chronic lung disease and any of the following:  Wheezing.  Prolonged cough.  Coughing up blood.  A change in your usual mucus.  You have a stiff neck.  You have changes in your:  Vision.  Hearing.  Thinking.  Mood. MAKE SURE YOU:   Understand these instructions.  Will watch your condition.  Will get help right away if you are not doing well or get worse.   This information is not intended to replace advice given to you by your health care provider. Make sure you discuss any questions you have with your health care provider.   Document Released: 01/24/2001 Document Revised: 12/15/2014 Document Reviewed: 11/05/2013 Elsevier Interactive Patient Education [employer].
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
## 2015-09-02 DIAGNOSIS — D320 Benign neoplasm of cerebral meninges: Secondary | ICD-10-CM | POA: Diagnosis not present

## 2015-09-02 DIAGNOSIS — I471 Supraventricular tachycardia: Secondary | ICD-10-CM | POA: Diagnosis not present

## 2015-09-02 DIAGNOSIS — I10 Essential (primary) hypertension: Secondary | ICD-10-CM | POA: Diagnosis not present

## 2015-09-03 ENCOUNTER — Encounter: Payer: Self-pay | Admitting: Family Medicine

## 2015-09-03 ENCOUNTER — Encounter (HOSPITAL_BASED_OUTPATIENT_CLINIC_OR_DEPARTMENT_OTHER): Payer: Self-pay | Admitting: Emergency Medicine

## 2015-09-03 ENCOUNTER — Emergency Department (HOSPITAL_BASED_OUTPATIENT_CLINIC_OR_DEPARTMENT_OTHER)
Admission: EM | Admit: 2015-09-03 | Discharge: 2015-09-03 | Disposition: A | Discharge status: Home / Self Care | Payer: Medicare Other | Attending: Emergency Medicine | Admitting: Emergency Medicine

## 2015-09-03 DIAGNOSIS — Z7951 Long term (current) use of inhaled steroids: Secondary | ICD-10-CM | POA: Diagnosis not present

## 2015-09-03 DIAGNOSIS — Z880 Allergy status to penicillin: Secondary | ICD-10-CM | POA: Diagnosis not present

## 2015-09-03 DIAGNOSIS — I10 Essential (primary) hypertension: Secondary | ICD-10-CM | POA: Insufficient documentation

## 2015-09-03 DIAGNOSIS — K219 Gastro-esophageal reflux disease without esophagitis: Secondary | ICD-10-CM | POA: Diagnosis not present

## 2015-09-03 DIAGNOSIS — G4730 Sleep apnea, unspecified: Secondary | ICD-10-CM | POA: Diagnosis not present

## 2015-09-03 DIAGNOSIS — Z79899 Other long term (current) drug therapy: Secondary | ICD-10-CM | POA: Insufficient documentation

## 2015-09-03 DIAGNOSIS — J45909 Unspecified asthma, uncomplicated: Secondary | ICD-10-CM | POA: Diagnosis not present

## 2015-09-03 DIAGNOSIS — Z9889 Other specified postprocedural states: Secondary | ICD-10-CM | POA: Insufficient documentation

## 2015-09-03 DIAGNOSIS — Z87891 Personal history of nicotine dependence: Secondary | ICD-10-CM | POA: Insufficient documentation

## 2015-09-03 DIAGNOSIS — D320 Benign neoplasm of cerebral meninges: Secondary | ICD-10-CM | POA: Diagnosis not present

## 2015-09-03 DIAGNOSIS — R079 Chest pain, unspecified: Secondary | ICD-10-CM | POA: Diagnosis not present

## 2015-09-03 DIAGNOSIS — Z8742 Personal history of other diseases of the female genital tract: Secondary | ICD-10-CM | POA: Diagnosis not present

## 2015-09-03 DIAGNOSIS — R05 Cough: Secondary | ICD-10-CM | POA: Diagnosis not present

## 2015-09-03 DIAGNOSIS — Z8614 Personal history of Methicillin resistant Staphylococcus aureus infection: Secondary | ICD-10-CM | POA: Insufficient documentation

## 2015-09-03 DIAGNOSIS — Z9981 Dependence on supplemental oxygen: Secondary | ICD-10-CM | POA: Diagnosis not present

## 2015-09-03 DIAGNOSIS — Z8542 Personal history of malignant neoplasm of other parts of uterus: Secondary | ICD-10-CM | POA: Insufficient documentation

## 2015-09-03 DIAGNOSIS — Z862 Personal history of diseases of the blood and blood-forming organs and certain disorders involving the immune mechanism: Secondary | ICD-10-CM | POA: Insufficient documentation

## 2015-09-03 DIAGNOSIS — G8929 Other chronic pain: Secondary | ICD-10-CM

## 2015-09-03 DIAGNOSIS — R0602 Shortness of breath: Secondary | ICD-10-CM | POA: Diagnosis not present

## 2015-09-03 DIAGNOSIS — Z8639 Personal history of other endocrine, nutritional and metabolic disease: Secondary | ICD-10-CM | POA: Diagnosis not present

## 2015-09-03 NOTE — Discharge Instructions (Signed)
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
## 2015-09-03 NOTE — ED Notes (Signed)
Patient states that she continues to have pain to her right side and flank area, patient states that she feels like someone is grabbing her lung and squeezing it.

## 2015-09-03 NOTE — ED Notes (Signed)
MD at bedside. 

## 2015-09-06 ENCOUNTER — Encounter: Payer: Self-pay | Admitting: Family Medicine

## 2015-09-06 ENCOUNTER — Ambulatory Visit (INDEPENDENT_AMBULATORY_CARE_PROVIDER_SITE_OTHER): Payer: Medicare Other | Admitting: Family Medicine

## 2015-09-06 VITALS — BP 155/95 | HR 91 | Temp 99.2°F | Wt 187.0 lb

## 2015-09-06 DIAGNOSIS — R9389 Abnormal findings on diagnostic imaging of other specified body structures: Secondary | ICD-10-CM

## 2015-09-06 DIAGNOSIS — Z91018 Allergy to other foods: Secondary | ICD-10-CM | POA: Diagnosis not present

## 2015-09-06 DIAGNOSIS — M79604 Pain in right leg: Secondary | ICD-10-CM | POA: Diagnosis not present

## 2015-09-06 DIAGNOSIS — Z888 Allergy status to other drugs, medicaments and biological substances status: Secondary | ICD-10-CM | POA: Diagnosis not present

## 2015-09-06 DIAGNOSIS — Z886 Allergy status to analgesic agent status: Secondary | ICD-10-CM | POA: Diagnosis not present

## 2015-09-06 DIAGNOSIS — E785 Hyperlipidemia, unspecified: Secondary | ICD-10-CM | POA: Diagnosis not present

## 2015-09-06 DIAGNOSIS — K2270 Barrett's esophagus without dysplasia: Secondary | ICD-10-CM | POA: Diagnosis not present

## 2015-09-06 DIAGNOSIS — Z87892 Personal history of anaphylaxis: Secondary | ICD-10-CM | POA: Diagnosis not present

## 2015-09-06 DIAGNOSIS — D509 Iron deficiency anemia, unspecified: Secondary | ICD-10-CM | POA: Diagnosis not present

## 2015-09-06 DIAGNOSIS — Z8542 Personal history of malignant neoplasm of other parts of uterus: Secondary | ICD-10-CM | POA: Diagnosis not present

## 2015-09-06 DIAGNOSIS — R569 Unspecified convulsions: Secondary | ICD-10-CM | POA: Diagnosis not present

## 2015-09-06 DIAGNOSIS — J069 Acute upper respiratory infection, unspecified: Secondary | ICD-10-CM

## 2015-09-06 DIAGNOSIS — Z883 Allergy status to other anti-infective agents status: Secondary | ICD-10-CM | POA: Diagnosis not present

## 2015-09-06 DIAGNOSIS — Z8589 Personal history of malignant neoplasm of other organs and systems: Secondary | ICD-10-CM | POA: Diagnosis not present

## 2015-09-06 DIAGNOSIS — G35 Multiple sclerosis: Secondary | ICD-10-CM | POA: Diagnosis not present

## 2015-09-06 DIAGNOSIS — F419 Anxiety disorder, unspecified: Secondary | ICD-10-CM | POA: Diagnosis not present

## 2015-09-06 DIAGNOSIS — J040 Acute laryngitis: Secondary | ICD-10-CM | POA: Diagnosis not present

## 2015-09-06 DIAGNOSIS — Z885 Allergy status to narcotic agent status: Secondary | ICD-10-CM | POA: Diagnosis not present

## 2015-09-06 DIAGNOSIS — K219 Gastro-esophageal reflux disease without esophagitis: Secondary | ICD-10-CM | POA: Diagnosis not present

## 2015-09-06 DIAGNOSIS — I10 Essential (primary) hypertension: Secondary | ICD-10-CM | POA: Diagnosis not present

## 2015-09-06 DIAGNOSIS — Z880 Allergy status to penicillin: Secondary | ICD-10-CM | POA: Diagnosis not present

## 2015-09-06 DIAGNOSIS — M549 Dorsalgia, unspecified: Secondary | ICD-10-CM | POA: Diagnosis not present

## 2015-09-06 DIAGNOSIS — I471 Supraventricular tachycardia: Secondary | ICD-10-CM | POA: Diagnosis not present

## 2015-09-06 DIAGNOSIS — R938 Abnormal findings on diagnostic imaging of other specified body structures: Secondary | ICD-10-CM

## 2015-09-06 DIAGNOSIS — R059 Cough, unspecified: Secondary | ICD-10-CM

## 2015-09-06 DIAGNOSIS — R05 Cough: Secondary | ICD-10-CM | POA: Diagnosis not present

## 2015-09-06 DIAGNOSIS — Z882 Allergy status to sulfonamides status: Secondary | ICD-10-CM | POA: Diagnosis not present

## 2015-09-06 DIAGNOSIS — J45909 Unspecified asthma, uncomplicated: Secondary | ICD-10-CM | POA: Diagnosis not present

## 2015-09-06 DIAGNOSIS — G8929 Other chronic pain: Secondary | ICD-10-CM | POA: Diagnosis not present

## 2015-09-06 DIAGNOSIS — R7303 Prediabetes: Secondary | ICD-10-CM | POA: Diagnosis not present

## 2015-09-06 DIAGNOSIS — E269 Hyperaldosteronism, unspecified: Secondary | ICD-10-CM | POA: Diagnosis not present

## 2015-09-06 DIAGNOSIS — M542 Cervicalgia: Secondary | ICD-10-CM | POA: Diagnosis not present

## 2015-09-06 MED ORDER — CLINDAMYCIN HCL 150 MG PO CAPS: 150.0000 mg | ORAL_CAPSULE | Freq: Two times a day (BID) | ORAL | Status: DC

## 2015-09-06 NOTE — Progress Notes (Signed)
   Subjective:    Patient ID: KINDEL WINKFIELD, female    DOB: 06/24/1966, 50 y.o.   MRN: [id number]  HPI Has had 15 days of cough, SOB and laryngitis and cough.  Went to ED and given Rx for doxy and given Depo-medol IM 10mg  this AM.  Says hasn't felt well. Had neg flu and neg strep. She also had a neck x-ray since she had been complaining of feeling like her throat was swollen again. Has been sore more on the right side. She has been using her albuterol once a day.  She is tearful in the office today. She has had some sweats but not sure if she has actually had a fever.  She is hurting on her right leg again and her right ribs and side as well. She has been coughing.  Has felt sore. Using heating pad.  Given a cough medicine.  She is actually due for repeat ultrasound. She had one done on 07/19/2015 to rule out a DVT because of pain and swelling near her right knee. They noticed multiple scattered echogenic lesions in the muscles of the medial right thigh primarily distally. It was felt to be consistent with intramuscular hematomas but they had recommended follow-up in 4-6 weeks to follow the area.  HTN - BP has been well controlled.  He says she's been tracking at home and when she has gone to the ED her blood pressures have actually looked great. There was high here today. She has not been taking her blood pressure medication this last week or so since she's been sick.   Review of Systems     Objective:   Physical Exam  Constitutional: She is oriented to person, place, and time. She appears well-developed and well-nourished.  HENT:  Head: Normocephalic and atraumatic.  Right Ear: External ear normal.  Left Ear: External ear normal.  Nose: Nose normal.  Mouth/Throat: Oropharynx is clear and moist.  TMs and canals are clear.   Eyes: Conjunctivae and EOM are normal. Pupils are equal, round, and reactive to light.  Neck: Neck supple. No thyromegaly present.  Cardiovascular: Normal rate,  regular rhythm and normal heart sounds.   Pulmonary/Chest: Effort normal and breath sounds normal. She has no wheezes.  Lymphadenopathy:    She has no cervical adenopathy.  Neurological: She is alert and oriented to person, place, and time.  Skin: Skin is warm and dry.  Psychiatric: She has a normal mood and affect.          Assessment & Plan:  Laryngitis-unclear if more viral or bacterial infection. Consider pertussis though she has been immunized. Did swab her today for PCR and culture. She says she's not able to take the doxycycline that she was given the emergency department sinus which clindamycin. It's really one of the only antibiotics that she seems to tolerate well. Encouraged her to go home and rest today. Next  Right leg/thigh pain-we'll go ahead and move forward with repeat ultrasound since she is due for this. To follow-up on the lesions and the muscle tissue. Next  Hypertension-uncontrolled here in the office today though she reports good blood pressures at home. Encouraged her to go home and rest today and try to restart her blood pressure medication.

## 2015-09-07 ENCOUNTER — Ambulatory Visit (HOSPITAL_BASED_OUTPATIENT_CLINIC_OR_DEPARTMENT_OTHER)
Admission: RE | Admit: 2015-09-07 | Discharge: 2015-09-07 | Disposition: A | Discharge status: Home / Self Care | Payer: Medicare Other | Source: Ambulatory Visit | Attending: Family Medicine | Admitting: Family Medicine

## 2015-09-07 DIAGNOSIS — M79604 Pain in right leg: Secondary | ICD-10-CM | POA: Diagnosis not present

## 2015-09-07 DIAGNOSIS — R938 Abnormal findings on diagnostic imaging of other specified body structures: Secondary | ICD-10-CM | POA: Diagnosis not present

## 2015-09-07 DIAGNOSIS — M799 Soft tissue disorder, unspecified: Secondary | ICD-10-CM | POA: Diagnosis not present

## 2015-09-07 DIAGNOSIS — R9389 Abnormal findings on diagnostic imaging of other specified body structures: Secondary | ICD-10-CM

## 2015-09-08 ENCOUNTER — Encounter: Payer: Self-pay | Admitting: Family Medicine

## 2015-09-08 DIAGNOSIS — R112 Nausea with vomiting, unspecified: Secondary | ICD-10-CM | POA: Diagnosis not present

## 2015-09-08 DIAGNOSIS — Z8542 Personal history of malignant neoplasm of other parts of uterus: Secondary | ICD-10-CM | POA: Diagnosis not present

## 2015-09-08 DIAGNOSIS — R109 Unspecified abdominal pain: Secondary | ICD-10-CM | POA: Diagnosis not present

## 2015-09-08 DIAGNOSIS — R0602 Shortness of breath: Secondary | ICD-10-CM | POA: Diagnosis not present

## 2015-09-08 DIAGNOSIS — E785 Hyperlipidemia, unspecified: Secondary | ICD-10-CM | POA: Diagnosis not present

## 2015-09-08 DIAGNOSIS — G35 Multiple sclerosis: Secondary | ICD-10-CM | POA: Diagnosis not present

## 2015-09-08 DIAGNOSIS — R0781 Pleurodynia: Secondary | ICD-10-CM | POA: Diagnosis not present

## 2015-09-08 DIAGNOSIS — Z87891 Personal history of nicotine dependence: Secondary | ICD-10-CM | POA: Diagnosis not present

## 2015-09-08 DIAGNOSIS — R509 Fever, unspecified: Secondary | ICD-10-CM | POA: Diagnosis not present

## 2015-09-08 DIAGNOSIS — I10 Essential (primary) hypertension: Secondary | ICD-10-CM | POA: Diagnosis not present

## 2015-09-08 DIAGNOSIS — G8929 Other chronic pain: Secondary | ICD-10-CM | POA: Diagnosis not present

## 2015-09-08 DIAGNOSIS — R1011 Right upper quadrant pain: Secondary | ICD-10-CM | POA: Diagnosis not present

## 2015-09-10 ENCOUNTER — Encounter (HOSPITAL_COMMUNITY): Payer: Self-pay

## 2015-09-10 ENCOUNTER — Emergency Department (HOSPITAL_COMMUNITY): Payer: Medicare Other

## 2015-09-10 ENCOUNTER — Emergency Department (HOSPITAL_COMMUNITY)
Admission: EM | Admit: 2015-09-10 | Discharge: 2015-09-10 | Disposition: A | Discharge status: Home / Self Care | Payer: Medicare Other | Attending: Emergency Medicine | Admitting: Emergency Medicine

## 2015-09-10 DIAGNOSIS — Z8542 Personal history of malignant neoplasm of other parts of uterus: Secondary | ICD-10-CM | POA: Insufficient documentation

## 2015-09-10 DIAGNOSIS — Z8614 Personal history of Methicillin resistant Staphylococcus aureus infection: Secondary | ICD-10-CM | POA: Insufficient documentation

## 2015-09-10 DIAGNOSIS — Z9851 Tubal ligation status: Secondary | ICD-10-CM | POA: Diagnosis not present

## 2015-09-10 DIAGNOSIS — Z9049 Acquired absence of other specified parts of digestive tract: Secondary | ICD-10-CM | POA: Diagnosis not present

## 2015-09-10 DIAGNOSIS — K219 Gastro-esophageal reflux disease without esophagitis: Secondary | ICD-10-CM | POA: Diagnosis not present

## 2015-09-10 DIAGNOSIS — Z8659 Personal history of other mental and behavioral disorders: Secondary | ICD-10-CM | POA: Diagnosis not present

## 2015-09-10 DIAGNOSIS — Z7952 Long term (current) use of systemic steroids: Secondary | ICD-10-CM | POA: Insufficient documentation

## 2015-09-10 DIAGNOSIS — R1011 Right upper quadrant pain: Secondary | ICD-10-CM | POA: Diagnosis not present

## 2015-09-10 DIAGNOSIS — Z87891 Personal history of nicotine dependence: Secondary | ICD-10-CM | POA: Insufficient documentation

## 2015-09-10 DIAGNOSIS — Z7951 Long term (current) use of inhaled steroids: Secondary | ICD-10-CM | POA: Diagnosis not present

## 2015-09-10 DIAGNOSIS — E663 Overweight: Secondary | ICD-10-CM | POA: Insufficient documentation

## 2015-09-10 DIAGNOSIS — Z880 Allergy status to penicillin: Secondary | ICD-10-CM | POA: Diagnosis not present

## 2015-09-10 DIAGNOSIS — Z79899 Other long term (current) drug therapy: Secondary | ICD-10-CM | POA: Insufficient documentation

## 2015-09-10 DIAGNOSIS — K5900 Constipation, unspecified: Secondary | ICD-10-CM

## 2015-09-10 DIAGNOSIS — Z9889 Other specified postprocedural states: Secondary | ICD-10-CM | POA: Diagnosis not present

## 2015-09-10 DIAGNOSIS — Z8544 Personal history of malignant neoplasm of other female genital organs: Secondary | ICD-10-CM | POA: Insufficient documentation

## 2015-09-10 DIAGNOSIS — J45909 Unspecified asthma, uncomplicated: Secondary | ICD-10-CM | POA: Diagnosis not present

## 2015-09-10 DIAGNOSIS — Z9089 Acquired absence of other organs: Secondary | ICD-10-CM | POA: Diagnosis not present

## 2015-09-10 DIAGNOSIS — Z792 Long term (current) use of antibiotics: Secondary | ICD-10-CM | POA: Insufficient documentation

## 2015-09-10 DIAGNOSIS — I10 Essential (primary) hypertension: Secondary | ICD-10-CM | POA: Diagnosis not present

## 2015-09-10 DIAGNOSIS — Z862 Personal history of diseases of the blood and blood-forming organs and certain disorders involving the immune mechanism: Secondary | ICD-10-CM | POA: Diagnosis not present

## 2015-09-10 DIAGNOSIS — G4733 Obstructive sleep apnea (adult) (pediatric): Secondary | ICD-10-CM | POA: Diagnosis not present

## 2015-09-10 DIAGNOSIS — R140 Abdominal distension (gaseous): Secondary | ICD-10-CM | POA: Diagnosis not present

## 2015-09-10 LAB — COMPREHENSIVE METABOLIC PANEL
ALT: 38 U/L (ref 14–54)
AST: 20 U/L (ref 15–41)
Albumin: 3.8 g/dL (ref 3.5–5.0)
Alkaline Phosphatase: 83 U/L (ref 38–126)
Anion gap: 11 (ref 5–15)
BUN: 14 mg/dL (ref 6–20)
CO2: 24 mmol/L (ref 22–32)
Calcium: 9 mg/dL (ref 8.9–10.3)
Chloride: 106 mmol/L (ref 101–111)
Creatinine, Ser: 0.61 mg/dL (ref 0.44–1.00)
GFR calc Af Amer: >60 mL/min (ref >60)
GFR calc non Af Amer: >60 mL/min (ref >60)
Glucose, Bld: 100 mg/dL — ABNORMAL HIGH (ref 65–99)
Potassium: 3.6 mmol/L (ref 3.5–5.1)
Sodium: 141 mmol/L (ref 135–145)
Total Bilirubin: 0.8 mg/dL (ref 0.3–1.2)
Total Protein: 6.7 g/dL (ref 6.5–8.1)

## 2015-09-10 LAB — URINALYSIS, ROUTINE W REFLEX MICROSCOPIC
Bilirubin Urine: NEGATIVE
Glucose, UA: NEGATIVE mg/dL
Hgb urine dipstick: NEGATIVE
Ketones, ur: NEGATIVE mg/dL
Leukocytes, UA: NEGATIVE
Nitrite: NEGATIVE
Protein, ur: NEGATIVE mg/dL
Specific Gravity, Urine: 1.021 (ref 1.005–1.030)
pH: 6.5 (ref 5.0–8.0)

## 2015-09-10 LAB — RAPID URINE DRUG SCREEN, HOSP PERFORMED
Amphetamines: NOT DETECTED
Barbiturates: NOT DETECTED
Benzodiazepines: NOT DETECTED
Cocaine: NOT DETECTED
Opiates: NOT DETECTED
Tetrahydrocannabinol: NOT DETECTED

## 2015-09-10 LAB — BORDETELLA PERTUSSIS PCR
B parapertussis, DNA: NOT DETECTED
B pertussis, DNA: NOT DETECTED

## 2015-09-10 LAB — CBC
HCT: 45 % (ref 36.0–46.0)
Hemoglobin: 14.8 g/dL (ref 12.0–15.0)
MCH: 30.5 pg (ref 26.0–34.0)
MCHC: 32.9 g/dL (ref 30.0–36.0)
MCV: 92.8 fL (ref 78.0–100.0)
Platelets: 167 10*3/uL (ref 150–400)
RBC: 4.85 MIL/uL (ref 3.87–5.11)
RDW: 13.7 % (ref 11.5–15.5)
WBC: 8.5 10*3/uL (ref 4.0–10.5)

## 2015-09-10 LAB — ETHANOL: Alcohol, Ethyl (B): <5 mg/dL (ref <5)

## 2015-09-10 LAB — LIPASE, BLOOD: Lipase: 36 U/L (ref 11–51)

## 2015-09-10 IMAGING — CT CT HEAD W/O CM
1 series · 16 of 30 positions shown, 20 images · non-contrast
Comparison: 01/04/2014.  MRI 09/05/2013

CLINICAL DATA: Occipital region headache, several days duration.
Hypertension.

EXAM:
CT HEAD WITHOUT CONTRAST
TECHNIQUE: Contiguous axial images were obtained from the base of the skull
through the vertex without intravenous contrast.

[Series 2: head 4.8 h37s · axial · 0.45mm/px · z∈[-131,+9]mm · 16 of 32 slices shown, 20 images]
[im 2/32  brain]
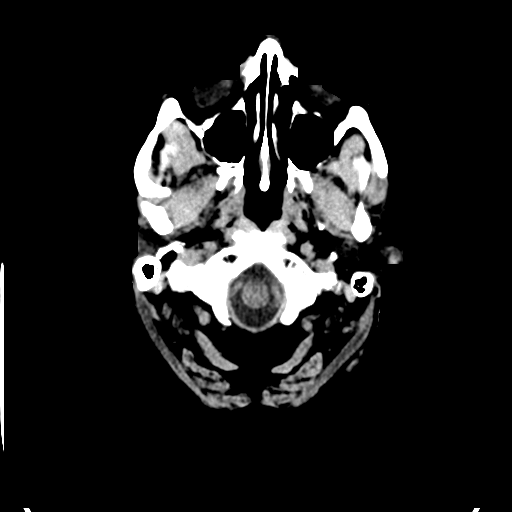
[im 2/32  bone]
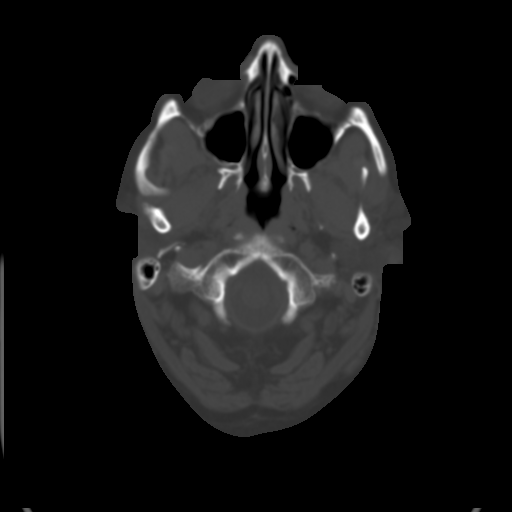
[im 4/32  brain]
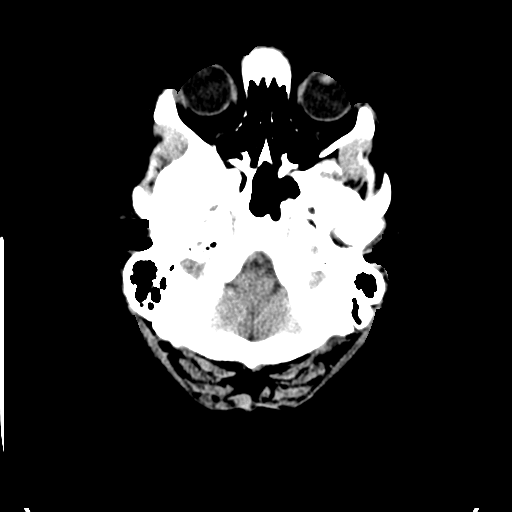
[im 6/32  brain]
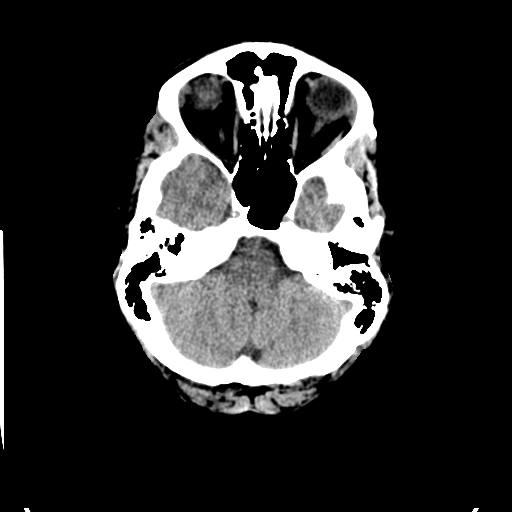
[im 8/32  brain]
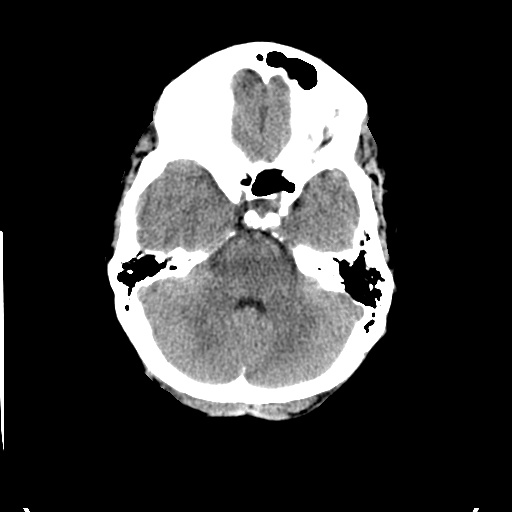
[im 9/32  brain]
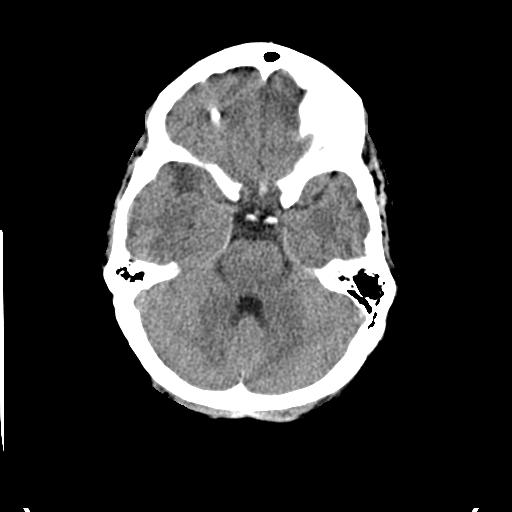
[im 9/32  bone]
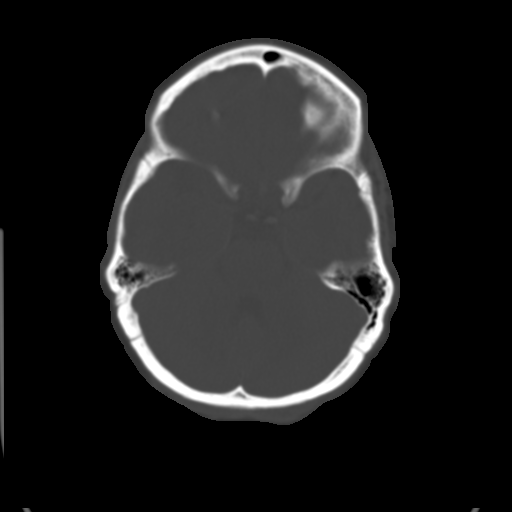
[im 11/32  brain]
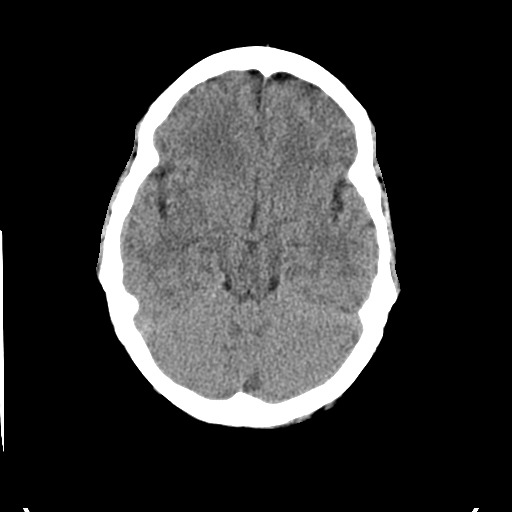
[im 13/32  brain]
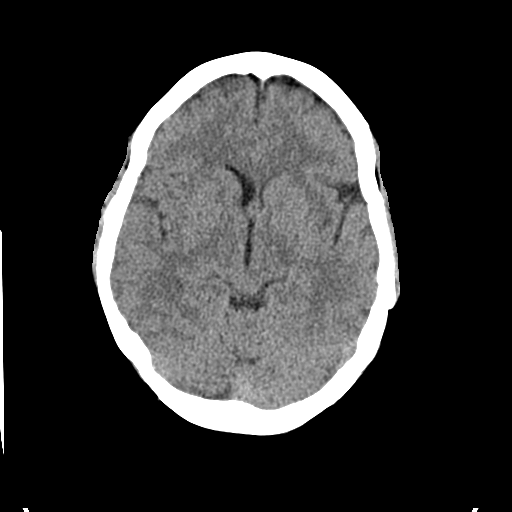
[im 15/32  brain]
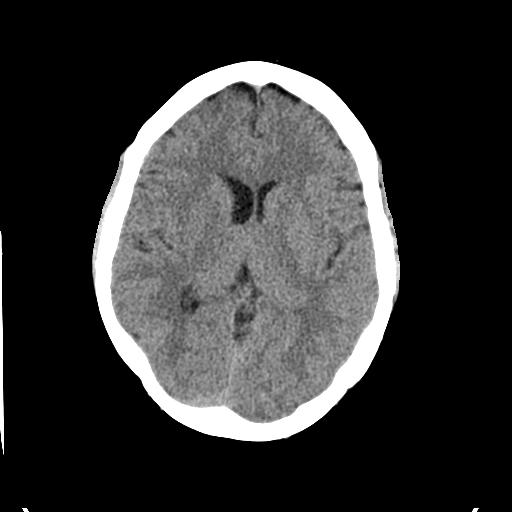
[im 17/32  brain]
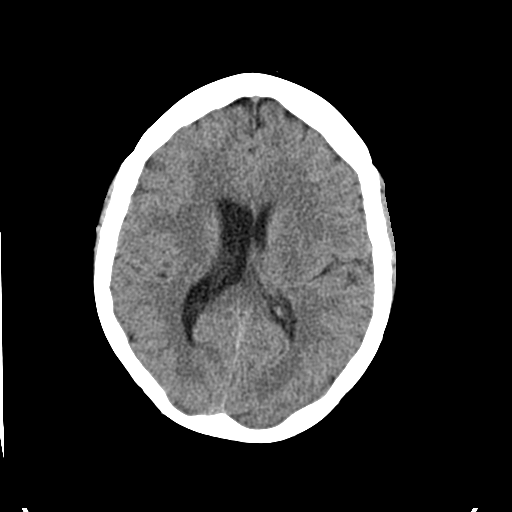
[im 17/32  bone]
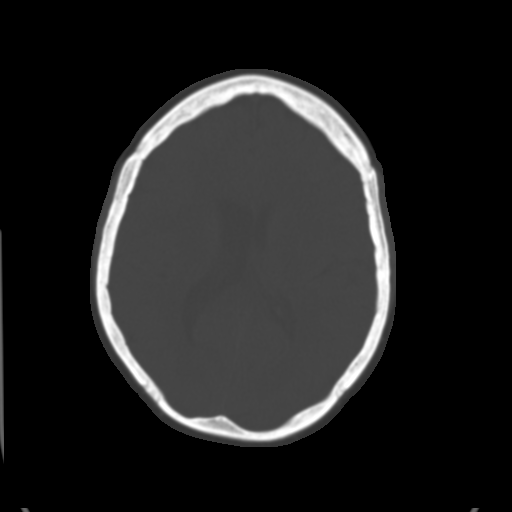
[im 19/32  brain]
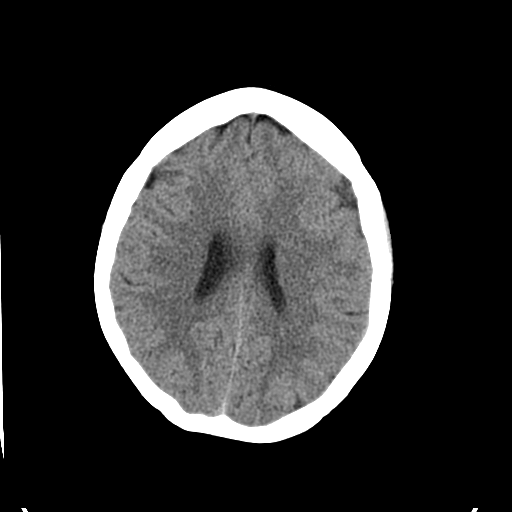
[im 21/32  brain]
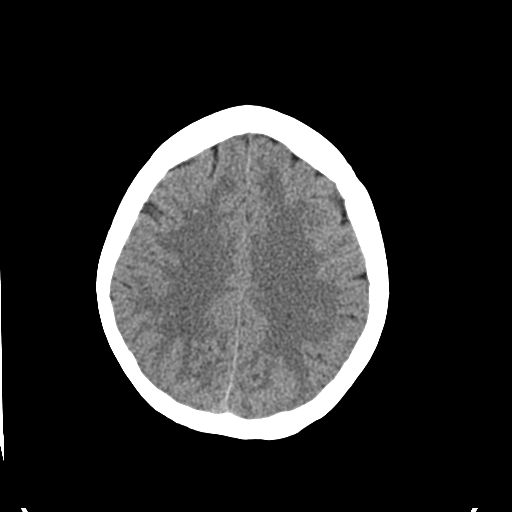
[im 23/32  brain]
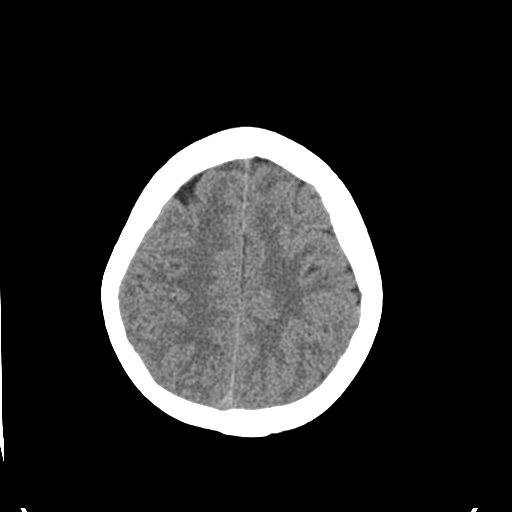
[im 24/32  brain]
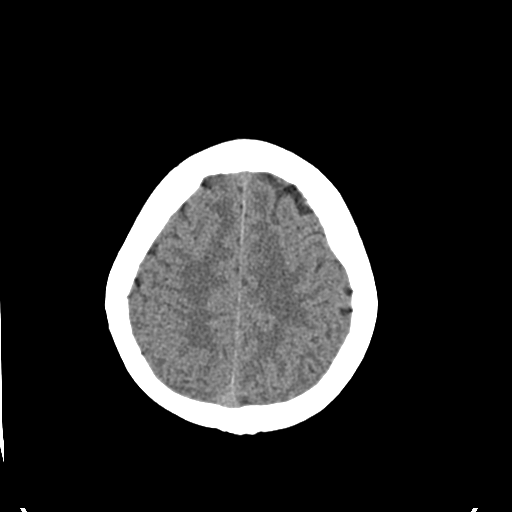
[im 24/32  bone]
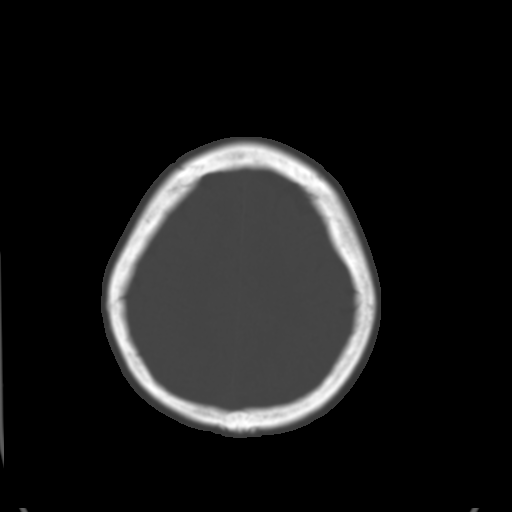
[im 26/32  brain]
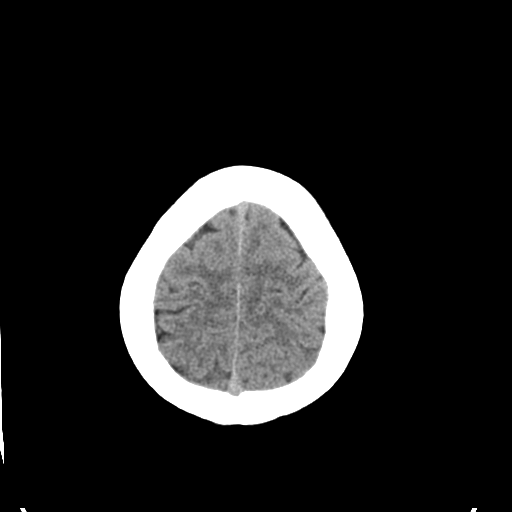
[im 28/32  brain]
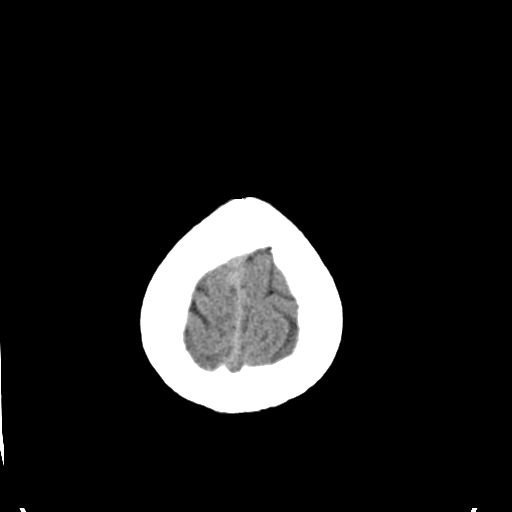
[im 30/32  brain]
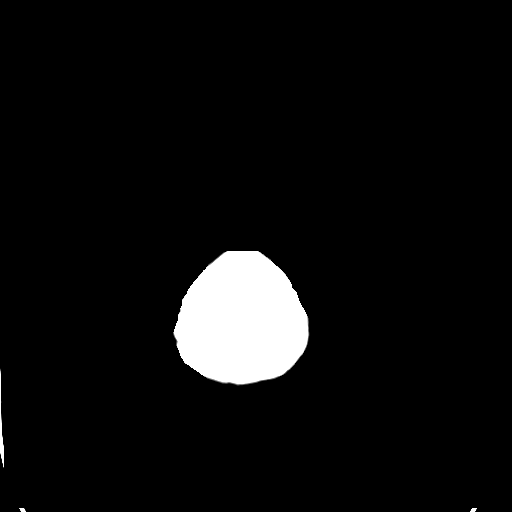

[16 of 30 positions shown; findings below may reference images not displayed]

FINDINGS: The brain has normal appearance without evidence of old or acute
infarction, intra-axial mass lesion, hemorrhage, hydrocephalus or
extra-axial collection. There is a 5 mm meningioma projecting to the
left from the falx near the vertex, not significant. There is slight
congenital asymmetry of ventricular size, not a significant finding.
The calvarium is unremarkable. Sinuses, middle ears and mastoids are
clear.
IMPRESSION: No acute or significant finding relating to the presentation. Slight
asymmetry and ventricular size from right to left which is a normal
variant. Sub cm meningioma along the left side of the upper falx,
unchanged since previous studies.

## 2015-09-10 MED ORDER — SODIUM CHLORIDE 0.9 % IV SOLN
INTRAVENOUS | Status: DC
  Administered 2015-09-10: 12:00:00 via INTRAVENOUS

## 2015-09-10 MED ORDER — SODIUM CHLORIDE 0.9 % IV BOLUS (SEPSIS)
1000.0000 mL | Freq: Once | INTRAVENOUS | Status: AC
  Administered 2015-09-10: 1000 mL via INTRAVENOUS

## 2015-09-10 MED ORDER — LACTULOSE 10 GM/15ML PO SOLN: 10.0000 g | Freq: Every day | ORAL | Status: DC | PRN

## 2015-09-10 NOTE — ED Notes (Signed)
Telephone communication with [place] about results.

## 2015-09-10 NOTE — ED Notes (Signed)
Ambulated to bathroom W/O difficulty
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
## 2015-09-10 NOTE — Discharge Instructions (Signed)
Abdominal Pain, Adult °Many things can cause abdominal pain. Usually, abdominal pain is not caused by a disease and will improve without treatment. It can often be observed and treated at home. Your health care provider will do a physical exam and possibly order blood tests and X-rays to help determine the seriousness of your pain. However, in many cases, more time must pass before a clear cause of the pain can be found. Before that point, your health care provider may not know if you need more testing or further treatment. °HOME CARE INSTRUCTIONS °Monitor your abdominal pain for any changes. The following actions may help to alleviate any discomfort you are experiencing: °· Only take over-the-counter or prescription medicines as directed by your health care provider. °· Do not take laxatives unless directed to do so by your health care provider. °· Try a clear liquid diet (broth, tea, or water) as directed by your health care provider. Slowly move to a bland diet as tolerated. °SEEK MEDICAL CARE IF: °· You have unexplained abdominal pain. °· You have abdominal pain associated with nausea or diarrhea. °· You have pain when you urinate or have a bowel movement. °· You experience abdominal pain that wakes you in the night. °· You have abdominal pain that is worsened or improved by eating food. °· You have abdominal pain that is worsened with eating fatty foods. °· You have a fever. °SEEK IMMEDIATE MEDICAL CARE IF: °· Your pain does not go away within 2 hours. °· You keep throwing up (vomiting). °· Your pain is felt only in portions of the abdomen, such as the right side or the left lower portion of the abdomen. °· You pass bloody or black tarry stools. °MAKE SURE YOU: °· Understand these instructions. °· Will watch your condition. °· Will get help right away if you are not doing well or get worse. °  °This information is not intended to replace advice given to you by your health care provider. Make sure you discuss  any questions you have with your health care provider. °  °Document Released: 05/10/2005 Document Revised: 04/21/2015 Document Reviewed: 04/09/2013 °Elsevier Interactive Patient Education ©2016 [employer] ° °Constipation, Adult °Constipation is when a person has fewer than three bowel movements a week, has difficulty having a bowel movement, or has stools that are dry, hard, or larger than normal. As people grow older, constipation is more common. A low-fiber diet, not taking in enough fluids, and taking certain medicines may make constipation worse.  °CAUSES  °· Certain medicines, such as antidepressants, pain medicine, iron supplements, antacids, and water pills.   °· Certain diseases, such as diabetes, irritable bowel syndrome (IBS), thyroid disease, or depression.   °· Not drinking enough water.   °· Not eating enough fiber-rich foods.   °· Stress or travel.   °· Lack of physical activity or exercise.   °· Ignoring the urge to have a bowel movement.   °· Using laxatives too much.   °SIGNS AND SYMPTOMS  °· Having fewer than three bowel movements a week.   °· Straining to have a bowel movement.   °· Having stools that are hard, dry, or larger than normal.   °· Feeling full or bloated.   °· Pain in the lower abdomen.   °· Not feeling relief after having a bowel movement.   °DIAGNOSIS  °Your health care provider will take a medical history and perform a physical exam. Further testing may be done for severe constipation. Some tests may include: °· A barium enema X-ray to examine your rectum, colon, and, sometimes,   your small intestine.   °· A sigmoidoscopy to examine your lower colon.   °· A colonoscopy to examine your entire colon. °TREATMENT  °Treatment will depend on the severity of your constipation and what is causing it. Some dietary treatments include drinking more fluids and eating more fiber-rich foods. Lifestyle treatments may include regular exercise. If these diet and lifestyle recommendations do not  help, your health care provider may recommend taking over-the-counter laxative medicines to help you have bowel movements. Prescription medicines may be prescribed if over-the-counter medicines do not work.  °HOME CARE INSTRUCTIONS  °· Eat foods that have a lot of fiber, such as fruits, vegetables, whole grains, and beans. °· Limit foods high in fat and processed sugars, such as french fries, hamburgers, cookies, candies, and soda.   °· A fiber supplement may be added to your diet if you cannot get enough fiber from foods.   °· Drink enough fluids to keep your urine clear or pale yellow.   °· Exercise regularly or as directed by your health care provider.   °· Go to the restroom when you have the urge to go. Do not hold it.   °· Only take over-the-counter or prescription medicines as directed by your health care provider. Do not take other medicines for constipation without talking to your health care provider first.   °SEEK IMMEDIATE MEDICAL CARE IF:  °· You have bright red blood in your stool.   °· Your constipation lasts for more than 4 days or gets worse.   °· You have abdominal or rectal pain.   °· You have thin, pencil-like stools.   °· You have unexplained weight loss. °MAKE SURE YOU:  °· Understand these instructions. °· Will watch your condition. °· Will get help right away if you are not doing well or get worse. °  °This information is not intended to replace advice given to you by your health care provider. Make sure you discuss any questions you have with your health care provider. °  °Document Released: 04/28/2004 Document Revised: 08/21/2014 Document Reviewed: 05/12/2013 °Elsevier Interactive Patient Education ©2016 Elsevier Inc. ° °

## 2015-09-10 NOTE — ED Notes (Addendum)
US at bedside

## 2015-09-10 NOTE — ED Notes (Signed)
Pt ambulating to bathroom for urine specimen.

## 2015-09-10 NOTE — ED Notes (Signed)
Bed: WA21 Expected date:  Expected time:  Means of arrival:  Comments: 

## 2015-09-10 NOTE — ED Notes (Signed)
Pt states she does not want her IV fluids. Fluids d/c

## 2015-09-10 NOTE — ED Notes (Signed)
Patient transported to X-ray 

## 2015-09-10 NOTE — ED Notes (Signed)
Pt seen at [facility] a week ago for rt flank pain. Told she was constipated.  Pt states no ultrasounds or exams done.  Pt nauseated with no fever.  States low grade fever with testing.  Last bm slow.  Struggling.  Frequent urination.

## 2015-09-13 ENCOUNTER — Other Ambulatory Visit: Payer: Self-pay | Admitting: Family Medicine

## 2015-09-13 DIAGNOSIS — R000 Tachycardia, unspecified: Secondary | ICD-10-CM | POA: Diagnosis not present

## 2015-09-13 DIAGNOSIS — R142 Eructation: Secondary | ICD-10-CM | POA: Diagnosis not present

## 2015-09-13 DIAGNOSIS — R490 Dysphonia: Secondary | ICD-10-CM | POA: Diagnosis not present

## 2015-09-13 DIAGNOSIS — J069 Acute upper respiratory infection, unspecified: Secondary | ICD-10-CM | POA: Diagnosis not present

## 2015-09-13 DIAGNOSIS — J3489 Other specified disorders of nose and nasal sinuses: Secondary | ICD-10-CM | POA: Insufficient documentation

## 2015-09-13 DIAGNOSIS — R110 Nausea: Secondary | ICD-10-CM | POA: Diagnosis not present

## 2015-09-13 DIAGNOSIS — J45901 Unspecified asthma with (acute) exacerbation: Secondary | ICD-10-CM | POA: Diagnosis not present

## 2015-09-13 DIAGNOSIS — Z87891 Personal history of nicotine dependence: Secondary | ICD-10-CM | POA: Diagnosis not present

## 2015-09-13 DIAGNOSIS — K5900 Constipation, unspecified: Secondary | ICD-10-CM | POA: Diagnosis not present

## 2015-09-13 DIAGNOSIS — E669 Obesity, unspecified: Secondary | ICD-10-CM | POA: Diagnosis not present

## 2015-09-13 DIAGNOSIS — I10 Essential (primary) hypertension: Secondary | ICD-10-CM | POA: Diagnosis not present

## 2015-09-13 DIAGNOSIS — R079 Chest pain, unspecified: Secondary | ICD-10-CM | POA: Diagnosis not present

## 2015-09-13 DIAGNOSIS — D179 Benign lipomatous neoplasm, unspecified: Secondary | ICD-10-CM

## 2015-09-13 DIAGNOSIS — J45909 Unspecified asthma, uncomplicated: Secondary | ICD-10-CM | POA: Diagnosis not present

## 2015-09-13 DIAGNOSIS — R0981 Nasal congestion: Secondary | ICD-10-CM | POA: Diagnosis not present

## 2015-09-13 DIAGNOSIS — K219 Gastro-esophageal reflux disease without esophagitis: Secondary | ICD-10-CM | POA: Diagnosis not present

## 2015-09-13 DIAGNOSIS — J342 Deviated nasal septum: Secondary | ICD-10-CM | POA: Diagnosis not present

## 2015-09-13 DIAGNOSIS — R9431 Abnormal electrocardiogram [ECG] [EKG]: Secondary | ICD-10-CM | POA: Diagnosis not present

## 2015-09-13 DIAGNOSIS — R1011 Right upper quadrant pain: Secondary | ICD-10-CM | POA: Diagnosis not present

## 2015-09-13 DIAGNOSIS — Z9049 Acquired absence of other specified parts of digestive tract: Secondary | ICD-10-CM | POA: Diagnosis not present

## 2015-09-13 DIAGNOSIS — R0781 Pleurodynia: Secondary | ICD-10-CM | POA: Diagnosis not present

## 2015-09-13 DIAGNOSIS — Z9889 Other specified postprocedural states: Secondary | ICD-10-CM | POA: Diagnosis not present

## 2015-09-14 ENCOUNTER — Ambulatory Visit (INDEPENDENT_AMBULATORY_CARE_PROVIDER_SITE_OTHER): Payer: Medicare Other | Admitting: Family Medicine

## 2015-09-14 ENCOUNTER — Encounter: Payer: Self-pay | Admitting: Family Medicine

## 2015-09-14 VITALS — BP 158/93 | HR 126 | Wt 198.0 lb

## 2015-09-14 DIAGNOSIS — I10 Essential (primary) hypertension: Secondary | ICD-10-CM | POA: Diagnosis not present

## 2015-09-14 DIAGNOSIS — K5900 Constipation, unspecified: Secondary | ICD-10-CM | POA: Diagnosis not present

## 2015-09-14 DIAGNOSIS — Z87891 Personal history of nicotine dependence: Secondary | ICD-10-CM | POA: Diagnosis not present

## 2015-09-14 DIAGNOSIS — E876 Hypokalemia: Secondary | ICD-10-CM | POA: Diagnosis not present

## 2015-09-14 DIAGNOSIS — R0981 Nasal congestion: Secondary | ICD-10-CM | POA: Diagnosis not present

## 2015-09-14 DIAGNOSIS — Z8542 Personal history of malignant neoplasm of other parts of uterus: Secondary | ICD-10-CM | POA: Diagnosis not present

## 2015-09-14 DIAGNOSIS — R1011 Right upper quadrant pain: Secondary | ICD-10-CM

## 2015-09-14 DIAGNOSIS — G35 Multiple sclerosis: Secondary | ICD-10-CM | POA: Diagnosis not present

## 2015-09-14 DIAGNOSIS — R140 Abdominal distension (gaseous): Secondary | ICD-10-CM | POA: Diagnosis not present

## 2015-09-14 DIAGNOSIS — G8929 Other chronic pain: Secondary | ICD-10-CM | POA: Diagnosis not present

## 2015-09-14 DIAGNOSIS — R05 Cough: Secondary | ICD-10-CM | POA: Diagnosis not present

## 2015-09-14 DIAGNOSIS — K648 Other hemorrhoids: Secondary | ICD-10-CM | POA: Diagnosis not present

## 2015-09-14 NOTE — Progress Notes (Signed)
   Subjective:    Patient ID: Jennifer [name], female    DOB: 1965-10-17, 50 y.o.   MRN: [id number]  HPI Martin Majestic to ED yesterday for right sided abdominal pain. She has a hx of GERD, IBS, esoph dysmotility.  She had a normal EKG. Just had CT of chest/abdomen/pelvis.  Neg for obstruction.  She was originally scheduled for endoscopy on Friday but missed her pre-op because she has been sick.  She has been taking lactuolose for 3 days but didn't really seem to move her bowels.  Worse with cough feels like a pressure and fullness and heavy.  Having pencil thin stools.  Feels like fractured  Rib.  She had been sick recently with a persistent cough. The cough is some better but still there.  She is more comfortable if she leans back.    Drank a whole bottle of mag citrate yesterday. She has started to move her bowels but feels like it's not as much it should be. And she still having the right upper quadrant pain. In fact she feels like it's a little bit more tender today. No nausea vomiting. She has had some watery stool. She says her rectal area is getting irritated and sore.    Review of Systems     Objective:   Physical Exam  Constitutional: She is oriented to person, place, and time. She appears well-developed and well-nourished.  HENT:  Head: Normocephalic and atraumatic.  Cardiovascular: Normal rate, regular rhythm and normal heart sounds.   Pulmonary/Chest: Effort normal and breath sounds normal.  Abdominal: Soft. Bowel sounds are normal. She exhibits no distension and no mass. There is tenderness. There is no rebound and no guarding.  Tender diffusely but pain radiated into her back when I pressed on the RUQ and LUQ.    Neurological: She is alert and oriented to person, place, and time.  Skin: Skin is warm and dry.  Psychiatric: She has a normal mood and affect. Her behavior is normal.          Assessment & Plan:  RUQ pain - she had a CT of chest abdomen and pelvis about 5 days ago  which was essentially negative. No sign of rib fracture or infection at the base of the lung. No sign of inflammation in the liver. Most likely related to gas trapped in the bowels. There was no sign of acute obstruction. She is actually scheduled for endoscopy on Friday and I encouraged her to keep that appointment. Certainly she can dose the maxillary treated again this evening to help get her bowels moving. She does not want anything for pain. Also consider could be radiating from her back/spine. That she's not having any spinal pain at this time. She also has a prescription for cough suppressant which should help reduce pain and discomfort.  Hypokalemia-she did start her potassium again this morning. She had not been taking it consistently and did have low potassium at the hospital and was given some extra through the IV. Encouraged her to continue to make sure that she is hydrating well.  Time spent 45 minutes, greater than 50% time spent counseling about the right upper quadrant pain and hypokalemia.

## 2015-09-15 ENCOUNTER — Ambulatory Visit (HOSPITAL_BASED_OUTPATIENT_CLINIC_OR_DEPARTMENT_OTHER): Payer: Medicare Other

## 2015-09-17 DIAGNOSIS — G35 Multiple sclerosis: Secondary | ICD-10-CM | POA: Diagnosis not present

## 2015-09-17 DIAGNOSIS — R109 Unspecified abdominal pain: Secondary | ICD-10-CM | POA: Diagnosis not present

## 2015-09-17 DIAGNOSIS — K210 Gastro-esophageal reflux disease with esophagitis: Secondary | ICD-10-CM | POA: Diagnosis not present

## 2015-09-17 DIAGNOSIS — K5900 Constipation, unspecified: Secondary | ICD-10-CM | POA: Diagnosis not present

## 2015-09-17 DIAGNOSIS — R1031 Right lower quadrant pain: Secondary | ICD-10-CM | POA: Diagnosis not present

## 2015-09-17 DIAGNOSIS — K648 Other hemorrhoids: Secondary | ICD-10-CM | POA: Diagnosis not present

## 2015-09-17 DIAGNOSIS — E669 Obesity, unspecified: Secondary | ICD-10-CM | POA: Diagnosis not present

## 2015-09-17 DIAGNOSIS — K5909 Other constipation: Secondary | ICD-10-CM | POA: Diagnosis not present

## 2015-09-17 DIAGNOSIS — R1084 Generalized abdominal pain: Secondary | ICD-10-CM | POA: Diagnosis not present

## 2015-09-17 DIAGNOSIS — N189 Chronic kidney disease, unspecified: Secondary | ICD-10-CM | POA: Diagnosis not present

## 2015-09-17 DIAGNOSIS — I10 Essential (primary) hypertension: Secondary | ICD-10-CM | POA: Diagnosis not present

## 2015-09-17 DIAGNOSIS — K449 Diaphragmatic hernia without obstruction or gangrene: Secondary | ICD-10-CM | POA: Diagnosis not present

## 2015-09-17 DIAGNOSIS — Z8719 Personal history of other diseases of the digestive system: Secondary | ICD-10-CM | POA: Diagnosis not present

## 2015-09-17 DIAGNOSIS — K219 Gastro-esophageal reflux disease without esophagitis: Secondary | ICD-10-CM | POA: Diagnosis not present

## 2015-09-17 DIAGNOSIS — D509 Iron deficiency anemia, unspecified: Secondary | ICD-10-CM | POA: Diagnosis not present

## 2015-09-17 DIAGNOSIS — R1011 Right upper quadrant pain: Secondary | ICD-10-CM | POA: Diagnosis not present

## 2015-09-17 DIAGNOSIS — Z8542 Personal history of malignant neoplasm of other parts of uterus: Secondary | ICD-10-CM | POA: Diagnosis not present

## 2015-09-17 DIAGNOSIS — K5730 Diverticulosis of large intestine without perforation or abscess without bleeding: Secondary | ICD-10-CM | POA: Diagnosis not present

## 2015-09-17 DIAGNOSIS — Z79899 Other long term (current) drug therapy: Secondary | ICD-10-CM | POA: Diagnosis not present

## 2015-09-17 DIAGNOSIS — Z87891 Personal history of nicotine dependence: Secondary | ICD-10-CM | POA: Diagnosis not present

## 2015-09-21 ENCOUNTER — Telehealth: Payer: Self-pay

## 2015-09-21 DIAGNOSIS — I471 Supraventricular tachycardia: Secondary | ICD-10-CM | POA: Diagnosis not present

## 2015-09-21 DIAGNOSIS — G35 Multiple sclerosis: Secondary | ICD-10-CM | POA: Diagnosis not present

## 2015-09-21 DIAGNOSIS — I10 Essential (primary) hypertension: Secondary | ICD-10-CM | POA: Diagnosis not present

## 2015-09-21 LAB — CULTURE, BORDETELLA PERTUSSIS

## 2015-09-22 ENCOUNTER — Ambulatory Visit (INDEPENDENT_AMBULATORY_CARE_PROVIDER_SITE_OTHER): Payer: Medicare Other | Admitting: Family Medicine

## 2015-09-22 ENCOUNTER — Encounter: Payer: Self-pay | Admitting: Family Medicine

## 2015-09-22 VITALS — BP 157/87 | HR 115 | Wt 198.7 lb

## 2015-09-22 DIAGNOSIS — R002 Palpitations: Secondary | ICD-10-CM | POA: Diagnosis not present

## 2015-09-22 DIAGNOSIS — E876 Hypokalemia: Secondary | ICD-10-CM | POA: Diagnosis not present

## 2015-09-22 DIAGNOSIS — I10 Essential (primary) hypertension: Secondary | ICD-10-CM | POA: Diagnosis not present

## 2015-09-22 DIAGNOSIS — R0789 Other chest pain: Secondary | ICD-10-CM

## 2015-09-22 NOTE — Progress Notes (Signed)
   Subjective:    Patient ID: Jennifer [name], female    DOB: [birth year], 50 y.o.   MRN: [id number]  HPI HTN - patient comes in today concerned about her blood pressure. It seems to have jumped up more recently. She was taking metoprolol for about a month and did well but stopped it when she became very sick. She did restarted a few days ago. Her heart rate has also been up. Unfortunately, over the last several year she's had episodes with her blood pressure and pulse suddenly jumping up and staying up for days at a time and then at other times it's very normal.  Palpitations - HR has been urnnign in the 130s intermittantly.  She denies drinking caffeine but does drink decaf green tea and coffee. Says her chest has been botherine her again.  She is getting back in wht Dr. Talbert Forest. She has been on the Toprol for about a month until she got sick. They started a few days ago.  She saw cards yesterday and they had recommended change to atenonol but hasn't done well in the past.  He is concerned because yesterday she had an episode of sharp chest pain. It was very brief and not associated with any shortness of breath or diaphoresis.  Review of Systems     Objective:   Physical Exam  Constitutional: She is oriented to person, place, and time. She appears well-developed and well-nourished.  HENT:  Head: Normocephalic and atraumatic.  Cardiovascular: Normal rate, regular rhythm and normal heart sounds.   Pulmonary/Chest: Effort normal and breath sounds normal.  Neurological: She is alert and oriented to person, place, and time.  Skin: Skin is warm and dry.  Psychiatric: She has a normal mood and affect. Her behavior is normal.          Assessment & Plan:  Palpitations - has an appointment scheduled with Dr. act very. In the meantime we'll increase metoprolol to 50 mg.  Hypertension-we discussed the natural course of blood pressure and the fact that it can fluctuate multiple times throughout  the day depending on factor such as diet, hydration, sleep, pain etc. What we are looking for is that most of the blood pressures are well controlled. Encouraged her to restart the metoprolol and in fact go ahead and increase to 50 mg to see if we can get the blood pressure down. I reminded her once again that it can take 2 weeks to reach full efficacy to control her blood pressure and her pulse. I think this would help control her palpitations as well. Did encourage her to keep a blood pressure diary and encouraged her to check her pressure about 3 times a week..    A typical chest pain-EKG performed today. EKG shows rate of 92 bpm, normal sinus rhythm with normal axis and no acute ST-T wave changes.

## 2015-09-22 NOTE — Patient Instructions (Addendum)
Increase Toprol XL to 1 in AM and 1 in PM.   Keep a blood pressure diary. Try to track her blood pressure about 3 times per week and note the days that you take your medicine and don't take your medicine. Also note any extenuating circumstances such as dietary changes or not sleeping or chest pain or swelling.    DASH Eating Plan DASH stands for "Dietary Approaches to Stop Hypertension." The DASH eating plan is a healthy eating plan that has been shown to reduce high blood pressure (hypertension). Additional health benefits may include reducing the risk of type 2 diabetes mellitus, heart disease, and stroke. The DASH eating plan may also help with weight loss. WHAT DO I NEED TO KNOW ABOUT THE DASH EATING PLAN? For the DASH eating plan, you will follow these general guidelines:  Choose foods with a percent daily value for sodium of less than 5% (as listed on the food label).  Use salt-free seasonings or herbs instead of table salt or sea salt.  Check with your health care provider or pharmacist before using salt substitutes.  Eat lower-sodium products, often labeled as "lower sodium" or "no salt added."  Eat fresh foods.  Eat more vegetables, fruits, and low-fat dairy products.  Choose whole grains. Look for the word "whole" as the first word in the ingredient list.  Choose fish and skinless chicken or [place] more often than red meat. Limit fish, poultry, and meat to 6 oz (170 g) each day.  Limit sweets, desserts, sugars, and sugary drinks.  Choose heart-healthy fats.  Limit cheese to 1 oz (28 g) per day.  Eat more home-cooked food and less restaurant, buffet, and fast food.  Limit fried foods.  Cook foods using methods other than frying.  Limit canned vegetables. If you do use them, rinse them well to decrease the sodium.  When eating at a restaurant, ask that your food be prepared with less salt, or no salt if possible. WHAT FOODS CAN I EAT? Seek help from a dietitian for  individual calorie needs. Grains Whole grain or whole wheat bread. Brown rice. Whole grain or whole wheat pasta. Quinoa, bulgur, and whole grain cereals. Low-sodium cereals. Corn or whole wheat flour tortillas. Whole grain cornbread. Whole grain crackers. Low-sodium crackers. Vegetables Fresh or frozen vegetables (raw, steamed, roasted, or grilled). Low-sodium or reduced-sodium tomato and vegetable juices. Low-sodium or reduced-sodium tomato sauce and paste. Low-sodium or reduced-sodium canned vegetables.  Fruits All fresh, canned (in natural juice), or frozen fruits. Meat and Other Protein Products Ground beef (85% or leaner), grass-fed beef, or beef trimmed of fat. Skinless chicken or [place]. Ground chicken or [place]. Pork trimmed of fat. All fish and seafood. Eggs. Dried beans, peas, or lentils. Unsalted nuts and seeds. Unsalted canned beans. Dairy Low-fat dairy products, such as skim or 1% milk, 2% or reduced-fat cheeses, low-fat ricotta or cottage cheese, or plain low-fat yogurt. Low-sodium or reduced-sodium cheeses. Fats and Oils Tub margarines without trans fats. Light or reduced-fat mayonnaise and salad dressings (reduced sodium). Avocado. Safflower, olive, or canola oils. Natural peanut or almond butter. Other Unsalted popcorn and pretzels. The items listed above may not be a complete list of recommended foods or beverages. Contact your dietitian for more options. WHAT FOODS ARE NOT RECOMMENDED? Grains White bread. White pasta. White rice. Refined cornbread. Bagels and croissants. Crackers that contain trans fat. Vegetables Creamed or fried vegetables. Vegetables in a cheese sauce. Regular canned vegetables. Regular canned tomato sauce and paste. Regular tomato  and vegetable juices. Fruits Dried fruits. Canned fruit in light or heavy syrup. Fruit juice. Meat and Other Protein Products Fatty cuts of meat. Ribs, chicken wings, bacon, sausage, bologna, salami, chitterlings, fatback, hot  dogs, bratwurst, and packaged luncheon meats. Salted nuts and seeds. Canned beans with salt. Dairy Whole or 2% milk, cream, half-and-half, and cream cheese. Whole-fat or sweetened yogurt. Full-fat cheeses or blue cheese. Nondairy creamers and whipped toppings. Processed cheese, cheese spreads, or cheese curds. Condiments Onion and garlic salt, seasoned salt, table salt, and sea salt. Canned and packaged gravies. Worcestershire sauce. Tartar sauce. Barbecue sauce. Teriyaki sauce. Soy sauce, including reduced sodium. Steak sauce. Fish sauce. Oyster sauce. Cocktail sauce. Horseradish. Ketchup and mustard. Meat flavorings and tenderizers. Bouillon cubes. Hot sauce. Tabasco sauce. Marinades. Taco seasonings. Relishes. Fats and Oils Butter, stick margarine, lard, shortening, ghee, and bacon fat. Coconut, palm kernel, or palm oils. Regular salad dressings. Other Pickles and olives. Salted popcorn and pretzels. The items listed above may not be a complete list of foods and beverages to avoid. Contact your dietitian for more information. WHERE CAN I FIND MORE INFORMATION? National Heart, Lung, and [employer] Institute: travelstabloid.com   This information is not intended to replace advice given to you by your health care provider. Make sure you discuss any questions you have with your health care provider.   Document Released: 07/20/2011 Document Revised: 08/21/2014 Document Reviewed: 06/04/2013 Elsevier Interactive Patient Education [employer].

## 2015-09-23 ENCOUNTER — Telehealth: Payer: Self-pay | Admitting: Family Medicine

## 2015-09-23 DIAGNOSIS — I10 Essential (primary) hypertension: Secondary | ICD-10-CM

## 2015-09-23 DIAGNOSIS — Z888 Allergy status to other drugs, medicaments and biological substances status: Secondary | ICD-10-CM | POA: Diagnosis not present

## 2015-09-23 DIAGNOSIS — Z885 Allergy status to narcotic agent status: Secondary | ICD-10-CM | POA: Diagnosis not present

## 2015-09-23 DIAGNOSIS — J45909 Unspecified asthma, uncomplicated: Secondary | ICD-10-CM | POA: Diagnosis not present

## 2015-09-23 DIAGNOSIS — D649 Anemia, unspecified: Secondary | ICD-10-CM | POA: Diagnosis not present

## 2015-09-23 DIAGNOSIS — Z8542 Personal history of malignant neoplasm of other parts of uterus: Secondary | ICD-10-CM | POA: Diagnosis not present

## 2015-09-23 DIAGNOSIS — G35 Multiple sclerosis: Secondary | ICD-10-CM | POA: Diagnosis not present

## 2015-09-23 DIAGNOSIS — E876 Hypokalemia: Secondary | ICD-10-CM | POA: Diagnosis not present

## 2015-09-23 DIAGNOSIS — G8929 Other chronic pain: Secondary | ICD-10-CM | POA: Diagnosis not present

## 2015-09-23 DIAGNOSIS — R002 Palpitations: Secondary | ICD-10-CM | POA: Diagnosis not present

## 2015-09-23 DIAGNOSIS — Z87891 Personal history of nicotine dependence: Secondary | ICD-10-CM | POA: Diagnosis not present

## 2015-09-23 DIAGNOSIS — R5383 Other fatigue: Secondary | ICD-10-CM | POA: Diagnosis not present

## 2015-09-23 DIAGNOSIS — K449 Diaphragmatic hernia without obstruction or gangrene: Secondary | ICD-10-CM | POA: Diagnosis not present

## 2015-09-23 DIAGNOSIS — Z886 Allergy status to analgesic agent status: Secondary | ICD-10-CM | POA: Diagnosis not present

## 2015-09-23 DIAGNOSIS — K5790 Diverticulosis of intestine, part unspecified, without perforation or abscess without bleeding: Secondary | ICD-10-CM | POA: Diagnosis not present

## 2015-09-23 NOTE — Telephone Encounter (Signed)
Call pt: potassium is low. She needs to restart her potassium.

## 2015-09-23 NOTE — Telephone Encounter (Signed)
Spoke with Pt, states she went to [facility] ED and got some oral potassium there and was advised to take her oral at home as directed on her bottle. Pt states she will take it but questions why she can't keep this level up.   Pt also questions the imaging results from a CT scan [facility] ED did on 09/08/15. I have printed these results and placed in PCP box for review. Pt advised we would contact her again with PCP's impression.

## 2015-09-24 ENCOUNTER — Ambulatory Visit: Payer: Medicare Other | Admitting: Gynecology

## 2015-09-24 NOTE — Telephone Encounter (Signed)
Pt advised of PCP impression regarding CT scan. Pt questions why no one has discussed her cholesterol issues with her before. It has been a couple months and due to lifestyle changes a new lab order has been placed to recheck these values. Will fax order to [facility], Pt go there weekly for Potassium checks. Advised Pt those results would be discussed at her upcoming appt on 10/05/15. Reminded Pt of appt and appt time. Verbalized understanding.

## 2015-09-24 NOTE — Telephone Encounter (Signed)
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 It has benign looking features so we typically do not work this up further. She was noted to have some atherosclerosis of aorta. This is hardening of the arteries. This typically happens with hypertension and with the positive cholesterol onto the blood vessels. A cholesterol lowering drug at bedtime could be helpful with this and will help reduce plaque formation. Controlling blood pressure will be helpful as well.

## 2015-09-26 ENCOUNTER — Ambulatory Visit (HOSPITAL_BASED_OUTPATIENT_CLINIC_OR_DEPARTMENT_OTHER)
Admission: RE | Admit: 2015-09-26 | Discharge: 2015-09-26 | Disposition: A | Discharge status: Home / Self Care | Payer: Medicare Other | Source: Ambulatory Visit | Attending: Family Medicine | Admitting: Family Medicine

## 2015-09-26 ENCOUNTER — Encounter (HOSPITAL_BASED_OUTPATIENT_CLINIC_OR_DEPARTMENT_OTHER): Payer: Self-pay | Admitting: Emergency Medicine

## 2015-09-26 ENCOUNTER — Emergency Department (HOSPITAL_BASED_OUTPATIENT_CLINIC_OR_DEPARTMENT_OTHER)
Admission: EM | Admit: 2015-09-26 | Discharge: 2015-09-26 | Disposition: A | Discharge status: Home / Self Care | Payer: Medicare Other | Attending: Emergency Medicine | Admitting: Emergency Medicine

## 2015-09-26 DIAGNOSIS — J45909 Unspecified asthma, uncomplicated: Secondary | ICD-10-CM | POA: Diagnosis not present

## 2015-09-26 DIAGNOSIS — Z5321 Procedure and treatment not carried out due to patient leaving prior to being seen by health care provider: Secondary | ICD-10-CM

## 2015-09-26 DIAGNOSIS — D1724 Benign lipomatous neoplasm of skin and subcutaneous tissue of left leg: Secondary | ICD-10-CM

## 2015-09-26 DIAGNOSIS — R2240 Localized swelling, mass and lump, unspecified lower limb: Secondary | ICD-10-CM | POA: Insufficient documentation

## 2015-09-26 DIAGNOSIS — G8929 Other chronic pain: Secondary | ICD-10-CM | POA: Diagnosis not present

## 2015-09-26 DIAGNOSIS — R42 Dizziness and giddiness: Secondary | ICD-10-CM | POA: Insufficient documentation

## 2015-09-26 DIAGNOSIS — D179 Benign lipomatous neoplasm, unspecified: Secondary | ICD-10-CM

## 2015-09-26 LAB — URINALYSIS, ROUTINE W REFLEX MICROSCOPIC
Bilirubin Urine: NEGATIVE
Glucose, UA: NEGATIVE mg/dL
Hgb urine dipstick: NEGATIVE
Ketones, ur: NEGATIVE mg/dL
Leukocytes, UA: NEGATIVE
Nitrite: NEGATIVE
Protein, ur: NEGATIVE mg/dL
Specific Gravity, Urine: 1.024 (ref 1.005–1.030)
pH: 6 (ref 5.0–8.0)

## 2015-09-26 NOTE — ED Notes (Signed)
Patient called for room, not in waiting room.  

## 2015-09-26 NOTE — ED Notes (Signed)
Pt called for 2nd time by RN, patient not found in waiting room or surrounding area.

## 2015-09-26 NOTE — ED Notes (Signed)
Patient states that she was here for a "test to evaluate the bumps on her leg" and she started to feel lightheaded. The patient reports that she has been unsteady on her feet. The patient is unsure if she has problem with her BP or K. Patient describes an ataxica type of walking. Patient walked to triage with steady gait and has talked without any changes in triage, and trying to guess multiple reasons for this feeling.

## 2015-09-26 NOTE — ED Notes (Signed)
Pt called by RN for 2nd time to come to a room.  Patient not found in ED, lobby, hallway or bathroom.

## 2015-09-27 ENCOUNTER — Telehealth: Payer: Self-pay

## 2015-09-27 NOTE — Telephone Encounter (Signed)
Jennifer [name] states she is now having facial swelling. She states this is probably due to the metoprolol. She wanted to know if there is something else she can take?

## 2015-09-27 NOTE — ED Provider Notes (Signed)
Patient left without being seen, I did not see or evaluate them.   Leo Grosser, MD 09/27/15 [phone]

## 2015-09-27 NOTE — Telephone Encounter (Signed)
Pt stated that she feels that she will need something and will need something by the morning because she knows her bp will be elevated.Jennifer [name] [place]

## 2015-09-28 ENCOUNTER — Encounter (HOSPITAL_BASED_OUTPATIENT_CLINIC_OR_DEPARTMENT_OTHER): Payer: Self-pay | Admitting: *Deleted

## 2015-09-28 ENCOUNTER — Emergency Department (HOSPITAL_BASED_OUTPATIENT_CLINIC_OR_DEPARTMENT_OTHER)
Admission: EM | Admit: 2015-09-28 | Discharge: 2015-09-28 | Disposition: A | Discharge status: Home / Self Care | Payer: Medicare Other | Attending: Emergency Medicine | Admitting: Emergency Medicine

## 2015-09-28 DIAGNOSIS — Z8614 Personal history of Methicillin resistant Staphylococcus aureus infection: Secondary | ICD-10-CM | POA: Insufficient documentation

## 2015-09-28 DIAGNOSIS — I471 Supraventricular tachycardia: Secondary | ICD-10-CM | POA: Insufficient documentation

## 2015-09-28 DIAGNOSIS — Z8541 Personal history of malignant neoplasm of cervix uteri: Secondary | ICD-10-CM | POA: Diagnosis not present

## 2015-09-28 DIAGNOSIS — Z880 Allergy status to penicillin: Secondary | ICD-10-CM | POA: Diagnosis not present

## 2015-09-28 DIAGNOSIS — Z87891 Personal history of nicotine dependence: Secondary | ICD-10-CM | POA: Insufficient documentation

## 2015-09-28 DIAGNOSIS — Z7951 Long term (current) use of inhaled steroids: Secondary | ICD-10-CM | POA: Diagnosis not present

## 2015-09-28 DIAGNOSIS — G4730 Sleep apnea, unspecified: Secondary | ICD-10-CM | POA: Insufficient documentation

## 2015-09-28 DIAGNOSIS — J45909 Unspecified asthma, uncomplicated: Secondary | ICD-10-CM | POA: Diagnosis not present

## 2015-09-28 DIAGNOSIS — Z9889 Other specified postprocedural states: Secondary | ICD-10-CM | POA: Insufficient documentation

## 2015-09-28 DIAGNOSIS — Z792 Long term (current) use of antibiotics: Secondary | ICD-10-CM | POA: Diagnosis not present

## 2015-09-28 DIAGNOSIS — Z79899 Other long term (current) drug therapy: Secondary | ICD-10-CM | POA: Insufficient documentation

## 2015-09-28 DIAGNOSIS — M542 Cervicalgia: Secondary | ICD-10-CM | POA: Diagnosis present

## 2015-09-28 DIAGNOSIS — J029 Acute pharyngitis, unspecified: Secondary | ICD-10-CM | POA: Diagnosis not present

## 2015-09-28 DIAGNOSIS — K219 Gastro-esophageal reflux disease without esophagitis: Secondary | ICD-10-CM | POA: Insufficient documentation

## 2015-09-28 DIAGNOSIS — Z8639 Personal history of other endocrine, nutritional and metabolic disease: Secondary | ICD-10-CM | POA: Insufficient documentation

## 2015-09-28 DIAGNOSIS — I10 Essential (primary) hypertension: Secondary | ICD-10-CM | POA: Diagnosis not present

## 2015-09-28 DIAGNOSIS — G8929 Other chronic pain: Secondary | ICD-10-CM | POA: Insufficient documentation

## 2015-09-28 DIAGNOSIS — Z8659 Personal history of other mental and behavioral disorders: Secondary | ICD-10-CM | POA: Diagnosis not present

## 2015-09-28 DIAGNOSIS — Z862 Personal history of diseases of the blood and blood-forming organs and certain disorders involving the immune mechanism: Secondary | ICD-10-CM | POA: Insufficient documentation

## 2015-09-28 LAB — CBC WITH DIFFERENTIAL/PLATELET
Basophils Absolute: 0 10*3/uL (ref 0.0–0.1)
Basophils Relative: 0 %
Eosinophils Absolute: 0.1 10*3/uL (ref 0.0–0.7)
Eosinophils Relative: 2 %
HCT: 43.1 % (ref 36.0–46.0)
Hemoglobin: 14.2 g/dL (ref 12.0–15.0)
Lymphocytes Relative: 23 %
Lymphs Abs: 1.6 10*3/uL (ref 0.7–4.0)
MCH: 30.5 pg (ref 26.0–34.0)
MCHC: 32.9 g/dL (ref 30.0–36.0)
MCV: 92.7 fL (ref 78.0–100.0)
Monocytes Absolute: 0.7 10*3/uL (ref 0.1–1.0)
Monocytes Relative: 10 %
Neutro Abs: 4.4 10*3/uL (ref 1.7–7.7)
Neutrophils Relative %: 65 %
Platelets: 198 10*3/uL (ref 150–400)
RBC: 4.65 MIL/uL (ref 3.87–5.11)
RDW: 13.2 % (ref 11.5–15.5)
WBC: 6.8 10*3/uL (ref 4.0–10.5)

## 2015-09-28 LAB — RAPID STREP SCREEN (MED CTR MEBANE ONLY): Streptococcus, Group A Screen (Direct): NEGATIVE
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
## 2015-09-28 NOTE — ED Notes (Signed)
Neck pain and swelling x 2 days.

## 2015-09-28 NOTE — Discharge Instructions (Signed)
Pharyngitis Take Tylenol as directed every 4 hours as needed for discomfort. See your primary care doctor if not improving in a week. Return if you develop difficulty breathing or difficulty swallowing. Your blood pressure should be rechecked in 3 weeks. Today's is mildly elevated at 156/96. Pharyngitis is a sore throat (pharynx). There is redness, pain, and swelling of your throat. HOME CARE   Drink enough fluids to keep your pee (urine) clear or pale yellow.  Only take medicine as told by your doctor.  You may get sick again if you do not take medicine as told. Finish your medicines, even if you start to feel better.  Do not take aspirin.  Rest.  Rinse your mouth (gargle) with salt water ( tsp of salt per 1 qt of water) every 1-2 hours. This will help the pain.  If you are not at risk for choking, you can suck on hard candy or sore throat lozenges. GET HELP IF:  You have large, tender lumps on your neck.  You have a rash.  You cough up green, yellow-brown, or bloody spit. GET HELP RIGHT AWAY IF:   You have a stiff neck.  You drool or cannot swallow liquids.  You throw up (vomit) or are not able to keep medicine or liquids down.  You have very bad pain that does not go away with medicine.  You have problems breathing (not from a stuffy nose). MAKE SURE YOU:   Understand these instructions.  Will watch your condition.  Will get help right away if you are not doing well or get worse.   This information is not intended to replace advice given to you by your health care provider. Make sure you discuss any questions you have with your health care provider.   Document Released: 01/17/2008 Document Revised: 05/21/2013 Document Reviewed: 04/07/2013 Elsevier Interactive Patient Education [employer].

## 2015-09-28 NOTE — Telephone Encounter (Signed)
She can go bak to the amlodipine for now and then we can adjust her dose once her swelling goes back down.

## 2015-09-29 DIAGNOSIS — Z87891 Personal history of nicotine dependence: Secondary | ICD-10-CM | POA: Diagnosis not present

## 2015-09-29 DIAGNOSIS — R109 Unspecified abdominal pain: Secondary | ICD-10-CM | POA: Diagnosis not present

## 2015-09-29 DIAGNOSIS — D509 Iron deficiency anemia, unspecified: Secondary | ICD-10-CM | POA: Diagnosis not present

## 2015-09-29 DIAGNOSIS — J45909 Unspecified asthma, uncomplicated: Secondary | ICD-10-CM | POA: Diagnosis not present

## 2015-09-29 DIAGNOSIS — G4733 Obstructive sleep apnea (adult) (pediatric): Secondary | ICD-10-CM | POA: Diagnosis not present

## 2015-09-29 DIAGNOSIS — R0789 Other chest pain: Secondary | ICD-10-CM | POA: Diagnosis not present

## 2015-09-29 DIAGNOSIS — G35 Multiple sclerosis: Secondary | ICD-10-CM | POA: Diagnosis not present

## 2015-09-29 DIAGNOSIS — G8929 Other chronic pain: Secondary | ICD-10-CM | POA: Diagnosis not present

## 2015-09-29 DIAGNOSIS — R569 Unspecified convulsions: Secondary | ICD-10-CM | POA: Diagnosis not present

## 2015-09-29 DIAGNOSIS — F4310 Post-traumatic stress disorder, unspecified: Secondary | ICD-10-CM | POA: Diagnosis not present

## 2015-09-29 DIAGNOSIS — I10 Essential (primary) hypertension: Secondary | ICD-10-CM | POA: Diagnosis not present

## 2015-09-29 DIAGNOSIS — F418 Other specified anxiety disorders: Secondary | ICD-10-CM | POA: Diagnosis not present

## 2015-09-29 DIAGNOSIS — R000 Tachycardia, unspecified: Secondary | ICD-10-CM | POA: Diagnosis not present

## 2015-09-29 DIAGNOSIS — R079 Chest pain, unspecified: Secondary | ICD-10-CM | POA: Diagnosis not present

## 2015-09-30 DIAGNOSIS — N281 Cyst of kidney, acquired: Secondary | ICD-10-CM | POA: Diagnosis not present

## 2015-09-30 DIAGNOSIS — K5900 Constipation, unspecified: Secondary | ICD-10-CM | POA: Diagnosis not present

## 2015-09-30 DIAGNOSIS — D1809 Hemangioma of other sites: Secondary | ICD-10-CM | POA: Diagnosis not present

## 2015-09-30 DIAGNOSIS — K5909 Other constipation: Secondary | ICD-10-CM | POA: Diagnosis not present

## 2015-09-30 DIAGNOSIS — R109 Unspecified abdominal pain: Secondary | ICD-10-CM | POA: Diagnosis not present

## 2015-09-30 NOTE — Telephone Encounter (Signed)
Pt states she stopped the metoprolol and the ED wants her to start taking Diltiazem. Pt will start that this weekend and she has a follow up next week.

## 2015-10-01 ENCOUNTER — Telehealth: Payer: Self-pay | Admitting: Family Medicine

## 2015-10-01 LAB — CULTURE, GROUP A STREP (THRC)

## 2015-10-01 NOTE — Telephone Encounter (Signed)
Received requests from [facility] for Norvasc. We did not refill the medication as she had Artie called [place] yesterday and said she was taking Cardizem instead.

## 2015-10-02 DIAGNOSIS — Z883 Allergy status to other anti-infective agents status: Secondary | ICD-10-CM | POA: Diagnosis not present

## 2015-10-02 DIAGNOSIS — K219 Gastro-esophageal reflux disease without esophagitis: Secondary | ICD-10-CM | POA: Diagnosis not present

## 2015-10-02 DIAGNOSIS — R140 Abdominal distension (gaseous): Secondary | ICD-10-CM | POA: Diagnosis not present

## 2015-10-02 DIAGNOSIS — Z7951 Long term (current) use of inhaled steroids: Secondary | ICD-10-CM | POA: Diagnosis not present

## 2015-10-02 DIAGNOSIS — I10 Essential (primary) hypertension: Secondary | ICD-10-CM | POA: Diagnosis not present

## 2015-10-02 DIAGNOSIS — Z880 Allergy status to penicillin: Secondary | ICD-10-CM | POA: Diagnosis not present

## 2015-10-02 DIAGNOSIS — Z885 Allergy status to narcotic agent status: Secondary | ICD-10-CM | POA: Diagnosis not present

## 2015-10-02 DIAGNOSIS — G8929 Other chronic pain: Secondary | ICD-10-CM | POA: Diagnosis not present

## 2015-10-02 DIAGNOSIS — Z882 Allergy status to sulfonamides status: Secondary | ICD-10-CM | POA: Diagnosis not present

## 2015-10-02 DIAGNOSIS — Z91018 Allergy to other foods: Secondary | ICD-10-CM | POA: Diagnosis not present

## 2015-10-02 DIAGNOSIS — Z791 Long term (current) use of non-steroidal anti-inflammatories (NSAID): Secondary | ICD-10-CM | POA: Diagnosis not present

## 2015-10-02 DIAGNOSIS — G35 Multiple sclerosis: Secondary | ICD-10-CM | POA: Diagnosis not present

## 2015-10-02 DIAGNOSIS — F419 Anxiety disorder, unspecified: Secondary | ICD-10-CM | POA: Diagnosis not present

## 2015-10-02 DIAGNOSIS — R1011 Right upper quadrant pain: Secondary | ICD-10-CM | POA: Diagnosis not present

## 2015-10-02 DIAGNOSIS — Z888 Allergy status to other drugs, medicaments and biological substances status: Secondary | ICD-10-CM | POA: Diagnosis not present

## 2015-10-02 DIAGNOSIS — M549 Dorsalgia, unspecified: Secondary | ICD-10-CM | POA: Diagnosis not present

## 2015-10-02 DIAGNOSIS — E269 Hyperaldosteronism, unspecified: Secondary | ICD-10-CM | POA: Diagnosis not present

## 2015-10-02 DIAGNOSIS — Z79899 Other long term (current) drug therapy: Secondary | ICD-10-CM | POA: Diagnosis not present

## 2015-10-02 DIAGNOSIS — J45909 Unspecified asthma, uncomplicated: Secondary | ICD-10-CM | POA: Diagnosis not present

## 2015-10-02 DIAGNOSIS — K2270 Barrett's esophagus without dysplasia: Secondary | ICD-10-CM | POA: Diagnosis not present

## 2015-10-02 DIAGNOSIS — K5900 Constipation, unspecified: Secondary | ICD-10-CM | POA: Diagnosis not present

## 2015-10-02 DIAGNOSIS — Z87891 Personal history of nicotine dependence: Secondary | ICD-10-CM | POA: Diagnosis not present

## 2015-10-02 DIAGNOSIS — Z8542 Personal history of malignant neoplasm of other parts of uterus: Secondary | ICD-10-CM | POA: Diagnosis not present

## 2015-10-02 DIAGNOSIS — E785 Hyperlipidemia, unspecified: Secondary | ICD-10-CM | POA: Diagnosis not present

## 2015-10-03 DIAGNOSIS — K5900 Constipation, unspecified: Secondary | ICD-10-CM | POA: Diagnosis not present

## 2015-10-03 DIAGNOSIS — R140 Abdominal distension (gaseous): Secondary | ICD-10-CM | POA: Diagnosis not present

## 2015-10-05 ENCOUNTER — Encounter: Payer: Self-pay | Admitting: Family Medicine

## 2015-10-05 ENCOUNTER — Ambulatory Visit (INDEPENDENT_AMBULATORY_CARE_PROVIDER_SITE_OTHER): Payer: Medicare Other | Admitting: Family Medicine

## 2015-10-05 VITALS — BP 140/78 | HR 100 | Wt 199.0 lb

## 2015-10-05 DIAGNOSIS — R1011 Right upper quadrant pain: Secondary | ICD-10-CM | POA: Diagnosis not present

## 2015-10-05 DIAGNOSIS — I10 Essential (primary) hypertension: Secondary | ICD-10-CM | POA: Diagnosis not present

## 2015-10-05 DIAGNOSIS — K5900 Constipation, unspecified: Secondary | ICD-10-CM | POA: Diagnosis not present

## 2015-10-05 DIAGNOSIS — K5909 Other constipation: Secondary | ICD-10-CM

## 2015-10-05 MED ORDER — DICYCLOMINE HCL 10 MG PO CAPS: 10.0000 mg | ORAL_CAPSULE | Freq: Three times a day (TID) | ORAL | Status: DC

## 2015-10-05 NOTE — Progress Notes (Signed)
   Subjective:    Patient ID: Jennifer [name], female    DOB: 1966-04-02, 50 y.o.   MRN: [id number]  HPI  Had colonoscpy last week adn got a good report. She is still having some right abdominal pain.  She is not sure if from her gut or if MSK.  Hx of cholecystectomy.  Has been having more muscle spasms in her back as well.  She was given dicyclomine which did help her sxs. She restarted her miralax for the last 2 days. Abdomen has felt more tight and bloated.   Follow-up hypertension-She retried the spironolactone but stopped it bc she is worried about getting dehydrated.  She was given a new rx for diltiazem SR 120mg  cap.     Review of Systems     Objective:   Physical Exam  Constitutional: She is oriented to person, place, and time. She appears well-developed and well-nourished.  HENT:  Head: Normocephalic and atraumatic.  Cardiovascular: Normal rate, regular rhythm and normal heart sounds.   Pulmonary/Chest: Effort normal and breath sounds normal.  Neurological: She is alert and oriented to person, place, and time.  Skin: Skin is warm and dry.  Psychiatric: She has a normal mood and affect. Her behavior is normal.          Assessment & Plan:  Chronic constipation - she is back on miralax. Will add bentyl.  Sounds like she actually responded really well to this. New prescription sent to the pharmacy. Monitor for constipation. She may need to be on something consistently to help keep her bowels moving and then using the Bentyl for pain gas and cramping.  HTN - she is going to start diltizem.  She is [facility] filled the prescription just hasn't started it yet. Like to see her back in 2 weeks to see how she's doing well on it.  RUQ pain - I really think this could be related to just sensitivity of the gut with excess bleeding and gas. She just had an abdominal plain film done 2 days ago which showed just a small amount of stool which is not surprising consider she just had her  colonoscopy last week and did a clean out. But it did show a lot of dilated bowel loops. Plain that this is consistent with excess gas.  She plans on joining silver sneakers.  Posterior to do so and to go as often as she can. If she doesn't feel great and encouraged her to go in and at least do some stretching and a light workout.  She knows she need to go back ot see Dr. Berdine Addison. She is feeling like her balance is off again.  Noticing her gait is off.    Time spent 35 minutes spent, greater than 50% time spent counseling about constipation and hypertension.

## 2015-10-06 DIAGNOSIS — J343 Hypertrophy of nasal turbinates: Secondary | ICD-10-CM | POA: Diagnosis not present

## 2015-10-06 DIAGNOSIS — Z87891 Personal history of nicotine dependence: Secondary | ICD-10-CM | POA: Diagnosis not present

## 2015-10-06 DIAGNOSIS — J342 Deviated nasal septum: Secondary | ICD-10-CM | POA: Diagnosis not present

## 2015-10-06 DIAGNOSIS — J3489 Other specified disorders of nose and nasal sinuses: Secondary | ICD-10-CM | POA: Diagnosis not present

## 2015-10-06 DIAGNOSIS — R0981 Nasal congestion: Secondary | ICD-10-CM | POA: Diagnosis not present

## 2015-10-07 ENCOUNTER — Encounter: Payer: Self-pay | Admitting: Family Medicine

## 2015-10-07 DIAGNOSIS — D649 Anemia, unspecified: Secondary | ICD-10-CM | POA: Diagnosis not present

## 2015-10-07 DIAGNOSIS — I10 Essential (primary) hypertension: Secondary | ICD-10-CM | POA: Diagnosis not present

## 2015-10-07 DIAGNOSIS — R0789 Other chest pain: Secondary | ICD-10-CM | POA: Diagnosis not present

## 2015-10-07 DIAGNOSIS — R000 Tachycardia, unspecified: Secondary | ICD-10-CM | POA: Diagnosis not present

## 2015-10-07 DIAGNOSIS — R55 Syncope and collapse: Secondary | ICD-10-CM | POA: Diagnosis not present

## 2015-10-07 DIAGNOSIS — Z87891 Personal history of nicotine dependence: Secondary | ICD-10-CM | POA: Diagnosis not present

## 2015-10-07 DIAGNOSIS — E876 Hypokalemia: Secondary | ICD-10-CM | POA: Diagnosis not present

## 2015-10-07 DIAGNOSIS — I160 Hypertensive urgency: Secondary | ICD-10-CM | POA: Diagnosis not present

## 2015-10-07 DIAGNOSIS — R42 Dizziness and giddiness: Secondary | ICD-10-CM | POA: Diagnosis not present

## 2015-10-07 DIAGNOSIS — R002 Palpitations: Secondary | ICD-10-CM | POA: Diagnosis not present

## 2015-10-07 DIAGNOSIS — G35 Multiple sclerosis: Secondary | ICD-10-CM | POA: Diagnosis not present

## 2015-10-07 DIAGNOSIS — Z8542 Personal history of malignant neoplasm of other parts of uterus: Secondary | ICD-10-CM | POA: Diagnosis not present

## 2015-10-07 DIAGNOSIS — G8929 Other chronic pain: Secondary | ICD-10-CM | POA: Diagnosis not present

## 2015-10-07 LAB — BASIC METABOLIC PANEL
BUN: 4 mg/dL (ref 4–21)
Creatinine: 0.5 mg/dL (ref 0.5–1.1)
Glucose: 103 mg/dL
Potassium: 3.9 mmol/L (ref 3.4–5.3)
Sodium: 140 mmol/L (ref 137–147)

## 2015-10-07 LAB — LIPID PANEL
Cholesterol: 210 mg/dL — AB (ref 0–200)
HDL: 41 mg/dL (ref 35–70)
LDL Cholesterol: 136 mg/dL
Triglycerides: 118 mg/dL (ref 40–160)
Triglycerides: 163 mg/dL — AB (ref 40–160)

## 2015-10-08 DIAGNOSIS — I471 Supraventricular tachycardia: Secondary | ICD-10-CM | POA: Diagnosis not present

## 2015-10-08 DIAGNOSIS — I10 Essential (primary) hypertension: Secondary | ICD-10-CM | POA: Diagnosis not present

## 2015-10-08 NOTE — Telephone Encounter (Signed)
Please call patient: I got her blood work from [facility] [employer] [facility]. Her potassium looks good at 3.9 and she actually looks really well hydrated which is fantastic. Total cholesterol and LDL are up just a little bit. But overall not too bad. She'll glycerides are also just up a little. Continue to work on healthy diet and I know she is working on trying to get back into exercise which is fantastic.

## 2015-10-09 DIAGNOSIS — G8929 Other chronic pain: Secondary | ICD-10-CM | POA: Diagnosis not present

## 2015-10-09 DIAGNOSIS — I10 Essential (primary) hypertension: Secondary | ICD-10-CM | POA: Diagnosis not present

## 2015-10-09 DIAGNOSIS — Z87891 Personal history of nicotine dependence: Secondary | ICD-10-CM | POA: Diagnosis not present

## 2015-10-09 DIAGNOSIS — F329 Major depressive disorder, single episode, unspecified: Secondary | ICD-10-CM | POA: Diagnosis not present

## 2015-10-09 DIAGNOSIS — Z79899 Other long term (current) drug therapy: Secondary | ICD-10-CM | POA: Diagnosis not present

## 2015-10-09 DIAGNOSIS — R079 Chest pain, unspecified: Secondary | ICD-10-CM | POA: Diagnosis not present

## 2015-10-09 DIAGNOSIS — R000 Tachycardia, unspecified: Secondary | ICD-10-CM | POA: Diagnosis not present

## 2015-10-09 DIAGNOSIS — F419 Anxiety disorder, unspecified: Secondary | ICD-10-CM | POA: Diagnosis not present

## 2015-10-09 DIAGNOSIS — R002 Palpitations: Secondary | ICD-10-CM | POA: Diagnosis not present

## 2015-10-09 DIAGNOSIS — J45909 Unspecified asthma, uncomplicated: Secondary | ICD-10-CM | POA: Diagnosis not present

## 2015-10-11 DIAGNOSIS — G4733 Obstructive sleep apnea (adult) (pediatric): Secondary | ICD-10-CM | POA: Diagnosis not present

## 2015-10-11 DIAGNOSIS — F419 Anxiety disorder, unspecified: Secondary | ICD-10-CM | POA: Diagnosis not present

## 2015-10-11 DIAGNOSIS — R000 Tachycardia, unspecified: Secondary | ICD-10-CM | POA: Diagnosis not present

## 2015-10-11 DIAGNOSIS — I471 Supraventricular tachycardia: Secondary | ICD-10-CM | POA: Diagnosis not present

## 2015-10-11 DIAGNOSIS — I10 Essential (primary) hypertension: Secondary | ICD-10-CM | POA: Diagnosis not present

## 2015-10-12 ENCOUNTER — Other Ambulatory Visit: Payer: Self-pay

## 2015-10-12 ENCOUNTER — Emergency Department (HOSPITAL_BASED_OUTPATIENT_CLINIC_OR_DEPARTMENT_OTHER): Payer: Medicare Other

## 2015-10-12 ENCOUNTER — Encounter (HOSPITAL_BASED_OUTPATIENT_CLINIC_OR_DEPARTMENT_OTHER): Payer: Self-pay | Admitting: *Deleted

## 2015-10-12 ENCOUNTER — Emergency Department (HOSPITAL_BASED_OUTPATIENT_CLINIC_OR_DEPARTMENT_OTHER)
Admission: EM | Admit: 2015-10-12 | Discharge: 2015-10-12 | Disposition: A | Discharge status: Home / Self Care | Payer: Medicare Other | Attending: Physician Assistant | Admitting: Physician Assistant

## 2015-10-12 DIAGNOSIS — J45901 Unspecified asthma with (acute) exacerbation: Secondary | ICD-10-CM | POA: Diagnosis not present

## 2015-10-12 DIAGNOSIS — Z8719 Personal history of other diseases of the digestive system: Secondary | ICD-10-CM | POA: Diagnosis not present

## 2015-10-12 DIAGNOSIS — Z7951 Long term (current) use of inhaled steroids: Secondary | ICD-10-CM | POA: Diagnosis not present

## 2015-10-12 DIAGNOSIS — Z8639 Personal history of other endocrine, nutritional and metabolic disease: Secondary | ICD-10-CM | POA: Insufficient documentation

## 2015-10-12 DIAGNOSIS — I10 Essential (primary) hypertension: Secondary | ICD-10-CM | POA: Diagnosis not present

## 2015-10-12 DIAGNOSIS — Z8669 Personal history of other diseases of the nervous system and sense organs: Secondary | ICD-10-CM | POA: Insufficient documentation

## 2015-10-12 DIAGNOSIS — Z880 Allergy status to penicillin: Secondary | ICD-10-CM | POA: Diagnosis not present

## 2015-10-12 DIAGNOSIS — R05 Cough: Secondary | ICD-10-CM | POA: Diagnosis not present

## 2015-10-12 DIAGNOSIS — Z8659 Personal history of other mental and behavioral disorders: Secondary | ICD-10-CM | POA: Diagnosis not present

## 2015-10-12 DIAGNOSIS — R42 Dizziness and giddiness: Secondary | ICD-10-CM | POA: Insufficient documentation

## 2015-10-12 DIAGNOSIS — R0602 Shortness of breath: Secondary | ICD-10-CM

## 2015-10-12 DIAGNOSIS — Z87891 Personal history of nicotine dependence: Secondary | ICD-10-CM | POA: Insufficient documentation

## 2015-10-12 DIAGNOSIS — R000 Tachycardia, unspecified: Secondary | ICD-10-CM | POA: Diagnosis not present

## 2015-10-12 DIAGNOSIS — Z862 Personal history of diseases of the blood and blood-forming organs and certain disorders involving the immune mechanism: Secondary | ICD-10-CM | POA: Insufficient documentation

## 2015-10-12 LAB — COMPREHENSIVE METABOLIC PANEL
ALT: 33 U/L (ref 14–54)
AST: 24 U/L (ref 15–41)
Albumin: 4.1 g/dL (ref 3.5–5.0)
Alkaline Phosphatase: 87 U/L (ref 38–126)
Anion gap: 12 (ref 5–15)
BUN: 13 mg/dL (ref 6–20)
CO2: 23 mmol/L (ref 22–32)
Calcium: 9.1 mg/dL (ref 8.9–10.3)
Chloride: 107 mmol/L (ref 101–111)
Creatinine, Ser: 0.5 mg/dL (ref 0.44–1.00)
GFR calc Af Amer: >60 mL/min (ref >60)
GFR calc non Af Amer: >60 mL/min (ref >60)
Glucose, Bld: 103 mg/dL — ABNORMAL HIGH (ref 65–99)
Potassium: 3.6 mmol/L (ref 3.5–5.1)
Sodium: 142 mmol/L (ref 135–145)
Total Bilirubin: 0.4 mg/dL (ref 0.3–1.2)
Total Protein: 7.3 g/dL (ref 6.5–8.1)

## 2015-10-12 LAB — CBC WITH DIFFERENTIAL/PLATELET
Basophils Absolute: 0 10*3/uL (ref 0.0–0.1)
Basophils Relative: 1 %
Eosinophils Absolute: 0.1 10*3/uL (ref 0.0–0.7)
Eosinophils Relative: 2 %
HCT: 42.3 % (ref 36.0–46.0)
Hemoglobin: 14.4 g/dL (ref 12.0–15.0)
Lymphocytes Relative: 16 %
Lymphs Abs: 0.8 10*3/uL (ref 0.7–4.0)
MCH: 31.3 pg (ref 26.0–34.0)
MCHC: 34 g/dL (ref 30.0–36.0)
MCV: 92 fL (ref 78.0–100.0)
Monocytes Absolute: 0.8 10*3/uL (ref 0.1–1.0)
Monocytes Relative: 15 %
Neutro Abs: 3.4 10*3/uL (ref 1.7–7.7)
Neutrophils Relative %: 66 %
Platelets: 180 10*3/uL (ref 150–400)
RBC: 4.6 MIL/uL (ref 3.87–5.11)
RDW: 13.3 % (ref 11.5–15.5)
WBC: 5.1 10*3/uL (ref 4.0–10.5)

## 2015-10-12 LAB — TROPONIN I: Troponin I: <0.03 ng/mL (ref <0.031)

## 2015-10-12 LAB — D-DIMER, QUANTITATIVE (NOT AT ARMC): D-Dimer, Quant: 0.33 ug/mL-FEU (ref 0.00–0.50)

## 2015-10-12 NOTE — ED Notes (Signed)
Pt given d/c instructions as per chart. Verbalizes understanding. No questions. 

## 2015-10-12 NOTE — ED Notes (Signed)
Sob and chest pain when she was vacuuming today. She felt a little sob yesterday. She is crying at triage. States she was started on Bystolic today.
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
## 2015-10-12 NOTE — Discharge Instructions (Signed)
We are unsure what caused your shortness of breath. You have normal oxygen requirements here, your chest xray is normal, you have no blood clot and your lungs are clear.  Please follow up with your regular phsyciian and cardiologist as needed for further care   Shortness of Breath Shortness of breath means you have trouble breathing. Shortness of breath needs medical care right away. HOME CARE   Do not smoke.  Avoid being around chemicals or things (paint fumes, dust) that may bother your breathing.  Rest as needed. Slowly begin your normal activities.  Only take medicines as told by your doctor.  Keep all doctor visits as told. GET HELP RIGHT AWAY IF:   Your shortness of breath gets worse.  You feel lightheaded, pass out (faint), or have a cough that is not helped by medicine.  You cough up blood.  You have pain with breathing.  You have pain in your chest, arms, shoulders, or belly (abdomen).  You have a fever.  You cannot walk up stairs or exercise the way you normally do.  You do not get better in the time expected.  You have a hard time doing normal activities even with rest.  You have problems with your medicines.  You have any new symptoms. MAKE SURE YOU:  Understand these instructions.  Will watch your condition.  Will get help right away if you are not doing well or get worse.   This information is not intended to replace advice given to you by your health care provider. Make sure you discuss any questions you have with your health care provider.   Document Released: 01/17/2008 Document Revised: 08/05/2013 Document Reviewed: 10/16/2011 Elsevier Interactive Patient Education [employer].

## 2015-10-13 ENCOUNTER — Encounter: Payer: Self-pay | Admitting: Internal Medicine

## 2015-10-13 ENCOUNTER — Ambulatory Visit (INDEPENDENT_AMBULATORY_CARE_PROVIDER_SITE_OTHER): Payer: Medicare Other | Admitting: Internal Medicine

## 2015-10-13 VITALS — BP 148/98 | HR 108 | Temp 99.2°F | Resp 20

## 2015-10-13 DIAGNOSIS — J4520 Mild intermittent asthma, uncomplicated: Secondary | ICD-10-CM | POA: Diagnosis not present

## 2015-10-13 DIAGNOSIS — J3089 Other allergic rhinitis: Secondary | ICD-10-CM

## 2015-10-13 MED ORDER — AZELASTINE-FLUTICASONE 137-50 MCG/ACT NA SUSP: NASAL | Status: DC

## 2015-10-13 MED ORDER — LEVOCETIRIZINE DIHYDROCHLORIDE 5 MG PO TABS: 5.0000 mg | ORAL_TABLET | Freq: Every evening | ORAL | Status: DC

## 2015-10-13 MED ORDER — LEVALBUTEROL TARTRATE 45 MCG/ACT IN AERO: 2.0000 | INHALATION_SPRAY | Freq: Four times a day (QID) | RESPIRATORY_TRACT | Status: DC | PRN

## 2015-10-13 NOTE — Patient Instructions (Signed)
Asthma  Intermittent, with vocal cord dysfunction  Continue Xopenex as needed   Other allergic rhinitis  Currently not well controlled due to springtime allergens  Start Xyzal 5 mg daily  Stop Nasonex and switch to Dymista 1 spray each nostril twice a day  Follow-up with Dr. Vicente Masson in ENT regarding surgery

## 2015-10-13 NOTE — Assessment & Plan Note (Signed)
   Currently not well controlled due to springtime allergens  Start Xyzal 5 mg daily  Stop Nasonex and switch to Dymista 1 spray each nostril twice a day  Follow-up with Dr. Vicente Masson in ENT regarding surgery

## 2015-10-13 NOTE — Telephone Encounter (Signed)
lvm informing pt of results and recommendations.Jennifer [name]  

## 2015-10-13 NOTE — Assessment & Plan Note (Signed)
   Intermittent, with vocal cord dysfunction  Continue Xopenex as needed

## 2015-10-13 NOTE — Progress Notes (Signed)
History of Present Illness: Jennifer [name] is a 50 y.o. female presenting for follow-up  HPI Comments: Asthma with vocal cord dysfunction: Following her antibiotics and steroid injection last visit, patient improved. She is having good symptom control  Allergic rhinitis: Skin testing in the past was positive for mold. She did well following antibiotics until 3 days ago when she developed increased sneezing, congestion and a dry cough. In the past, she was on Dymista and felt like it helped. Of note, she has been evaluated by Dr. Vicente Masson at [facility], ENT and she is planned for septoplasty and turbinate reduction bilaterally  Adverse reactions to foods: She has increased mucus with large amounts of dairy. She had an equivocal IgE in the past.   Current Outpatient Prescriptions on File Prior to Visit  Medication Sig Dispense Refill  . EPINEPHrine (EPIPEN 2-PAK) 0.3 mg/0.3 mL SOAJ injection Inject 0.3 mg into the muscle as needed (allergic reaction).     Marland Kitchen levalbuterol (XOPENEX) 1.25 MG/3ML nebulizer solution Take 1.25 mg by nebulization every 6 (six) hours as needed for wheezing. 90 mL 1  . mometasone (NASONEX) 50 MCG/ACT nasal spray Place 2 sprays into the nose daily. 17 g 12  . acetaminophen (TYLENOL) 500 MG tablet Take 1 tablet (500 mg total) by mouth every 6 (six) hours as needed. (Patient not taking: Reported on 10/13/2015) 30 tablet 0  . dicyclomine (BENTYL) 10 MG capsule Take 1 capsule (10 mg total) by mouth 4 (four) times daily -  before meals and at bedtime. (Patient not taking: Reported on 10/13/2015) 120 capsule 2  . magnesium citrate SOLN Take 296 mLs (1 Bottle total) by mouth once. (Patient not taking: Reported on 10/13/2015) 195 mL 2  . meclizine (ANTIVERT) 25 MG tablet Take 12.5-25 mg by mouth as needed for dizziness. Reported on 10/13/2015    . Nebivolol HCl (BYSTOLIC PO) Take by mouth. Reported on 10/13/2015    . potassium chloride 20 MEQ/15ML (10%) SOLN Take 7.5 mLs (10 mEq total) by  mouth daily. (Patient not taking: Reported on 10/13/2015) 3800 mL 1   No current facility-administered medications on file prior to visit.    Assessment and Plan: Mild intermittent asthma  Intermittent, with vocal cord dysfunction  Continue Xopenex as needed   Other allergic rhinitis  Currently not well controlled due to springtime allergens  Start Xyzal 5 mg daily  Stop Nasonex and switch to Dymista 1 spray each nostril twice a day  Follow-up with Dr. Vicente Masson in ENT regarding surgery     Return in about 1 year (around 10/12/2016).  Meds ordered this encounter  Medications  . sotalol (BETAPACE) 80 MG tablet    Sig: Take 40 mg by mouth. Reported on 10/13/2015  . levocetirizine (XYZAL) 5 MG tablet    Sig: Take 1 tablet (5 mg total) by mouth every evening.    Dispense:  30 tablet    Refill:  5  . Azelastine-Fluticasone 137-50 MCG/ACT SUSP    Sig: Use one spray each nostril twice a day    Dispense:  23 g    Refill:  5  . DISCONTD: levalbuterol (XOPENEX HFA) 45 MCG/ACT inhaler    Sig: Inhale 2 puffs into the lungs every 6 (six) hours as needed for wheezing.  . levalbuterol (XOPENEX HFA) 45 MCG/ACT inhaler    Sig: Inhale 2 puffs into the lungs every 6 (six) hours as needed for wheezing.    Dispense:  1 Inhaler    Refill:  1  Diagnostics: Spirometry: FEV1 2.55 L or 98 %, FEV1/FVC  89 %.  This is a normal study  Physical Exam: BP 148/98 mmHg  Pulse 108  Temp(Src) 99.2 F (37.3 C) (Oral)  Resp 20  LMP 06/25/2013   Physical Exam  Constitutional: She appears well-developed and well-nourished. No distress.  HENT:  Right Ear: External ear normal.  Left Ear: External ear normal.  Nose: Nose normal.  Mouth/Throat: Oropharynx is clear and moist.  Eyes: Conjunctivae are normal. Right eye exhibits no discharge. Left eye exhibits no discharge.  Cardiovascular: Normal rate, regular rhythm and normal heart sounds.   No murmur heard. Pulmonary/Chest: Effort normal and breath  sounds normal. No respiratory distress. She has no wheezes. She has no rales.  Abdominal: Soft. Bowel sounds are normal.  Musculoskeletal: She exhibits no edema.  Lymphadenopathy:    She has no cervical adenopathy.  Neurological: She is alert.  Skin: No rash noted.  Vitals reviewed.   Drug Allergies:  Allergies  Allergen Reactions  . Coreg [Carvedilol] Shortness Of Breath    CP  . Mushroom Extract Complex Anaphylaxis  . Nitrofurantoin Shortness Of Breath    REACTION: sweats  . Promethazine Hcl Anaphylaxis    jittery  . Telmisartan Swelling  . Adhesive [Tape]     EKG monitor patches, some tapes"reddnes,blisters"  . Aspirin Other (See Comments)    flushing  . Atenolol Other (See Comments)    Squeezing chest sensation  . Avelox [Moxifloxacin Hcl In Nacl] Itching and Other (See Comments)    Shortness of breath  . Azithromycin Other (See Comments)    Lip swelling, SOB.   . Beta Adrenergic Blockers     Feels like chest tighting "Metoprolol"  . Butorphanol Tartrate     REACTION: unknown  . Butorphanol Tartrate Other (See Comments)    Patient aggitated  . Cetirizine Other (See Comments)    Broke out in hives the day before, took Zyrtec &  wasn't sure if this made it worse  . Ciprofloxacin     REACTION: tongue swells  . Clonidine Hydrochloride     REACTION: makes blood pressure high  . Cortisone   . Cyprodenate Itching  . Doxycycline   . Fentanyl     [facility] record   . Fluoxetine Hcl Other (See Comments)    REACTION: headaches  . Iron   . Ketorolac Other (See Comments)    I can't sit still  . Ketorolac Tromethamine     jittery  . Lidocaine Other (See Comments)    "It messes me up".  "I can't take it."  . Lisinopril Cough    REACTION: cough  . Metoclopramide Hcl Other (See Comments)    Has a twitchy feeling  . Metoprolol     Other reaction(s): OTHER  . Milk-Related Compounds   . Montelukast Sodium     Unknown"Singulair"  . Naproxen   .  Paroxetine Other (See Comments)    REACTION: headaches  . Pravastatin Other (See Comments)    Myalgias  . Promethazine Other (See Comments)    I can't sit still  . Sertraline Hcl     REACTION: headaches  . Spironolactone   . Stelazine [Trifluoperazine]   . Tobramycin     itching , rash  . Trifluoperazine Hcl     REACTION: unknown  . Vancomycin     Other reaction(s): Other (See Comments), Unknown Other Reaction: all mycins  . Versed [Midazolam]     [facility]  record  . Ceftriaxone Sodium Rash  . Erythromycin Rash  . Metronidazole Rash  . Penicillins Rash  . Prochlorperazine Anxiety  . Quinolones Rash and Swelling  . Sulfonamide Derivatives Rash  . Venlafaxine Anxiety  . Zyrtec [Cetirizine Hcl] Rash    All over body    ROS: Per HPI unless specifically indicated below Review of Systems  Thank you for the opportunity to care for this patient.  Please do not hesitate to contact me with questions.

## 2015-10-14 ENCOUNTER — Encounter: Payer: Self-pay | Admitting: Family Medicine

## 2015-10-14 DIAGNOSIS — G8929 Other chronic pain: Secondary | ICD-10-CM | POA: Diagnosis not present

## 2015-10-14 DIAGNOSIS — I10 Essential (primary) hypertension: Secondary | ICD-10-CM | POA: Diagnosis not present

## 2015-10-14 DIAGNOSIS — Z87891 Personal history of nicotine dependence: Secondary | ICD-10-CM | POA: Diagnosis not present

## 2015-10-14 DIAGNOSIS — R509 Fever, unspecified: Secondary | ICD-10-CM | POA: Diagnosis not present

## 2015-10-14 DIAGNOSIS — G35 Multiple sclerosis: Secondary | ICD-10-CM | POA: Diagnosis not present

## 2015-10-14 DIAGNOSIS — J069 Acute upper respiratory infection, unspecified: Secondary | ICD-10-CM | POA: Diagnosis not present

## 2015-10-14 DIAGNOSIS — J349 Unspecified disorder of nose and nasal sinuses: Secondary | ICD-10-CM | POA: Diagnosis not present

## 2015-10-14 DIAGNOSIS — R0981 Nasal congestion: Secondary | ICD-10-CM | POA: Diagnosis not present

## 2015-10-14 DIAGNOSIS — R05 Cough: Secondary | ICD-10-CM | POA: Diagnosis not present

## 2015-10-14 DIAGNOSIS — D649 Anemia, unspecified: Secondary | ICD-10-CM | POA: Diagnosis not present

## 2015-10-14 DIAGNOSIS — R0602 Shortness of breath: Secondary | ICD-10-CM | POA: Diagnosis not present

## 2015-10-17 IMAGING — CR DG CHEST 2V
2 series · 2 of 2 positions shown · non-contrast
Comparison: 06/06/2014 and earlier.

CLINICAL DATA: 48-year-old female with chest pain beginning at 0000
hrs. Left chest wall and sternum pain radiating to the jaw and left
upper extremity. Initial encounter.

EXAM:
CHEST  2 VIEW

[w chest pa]
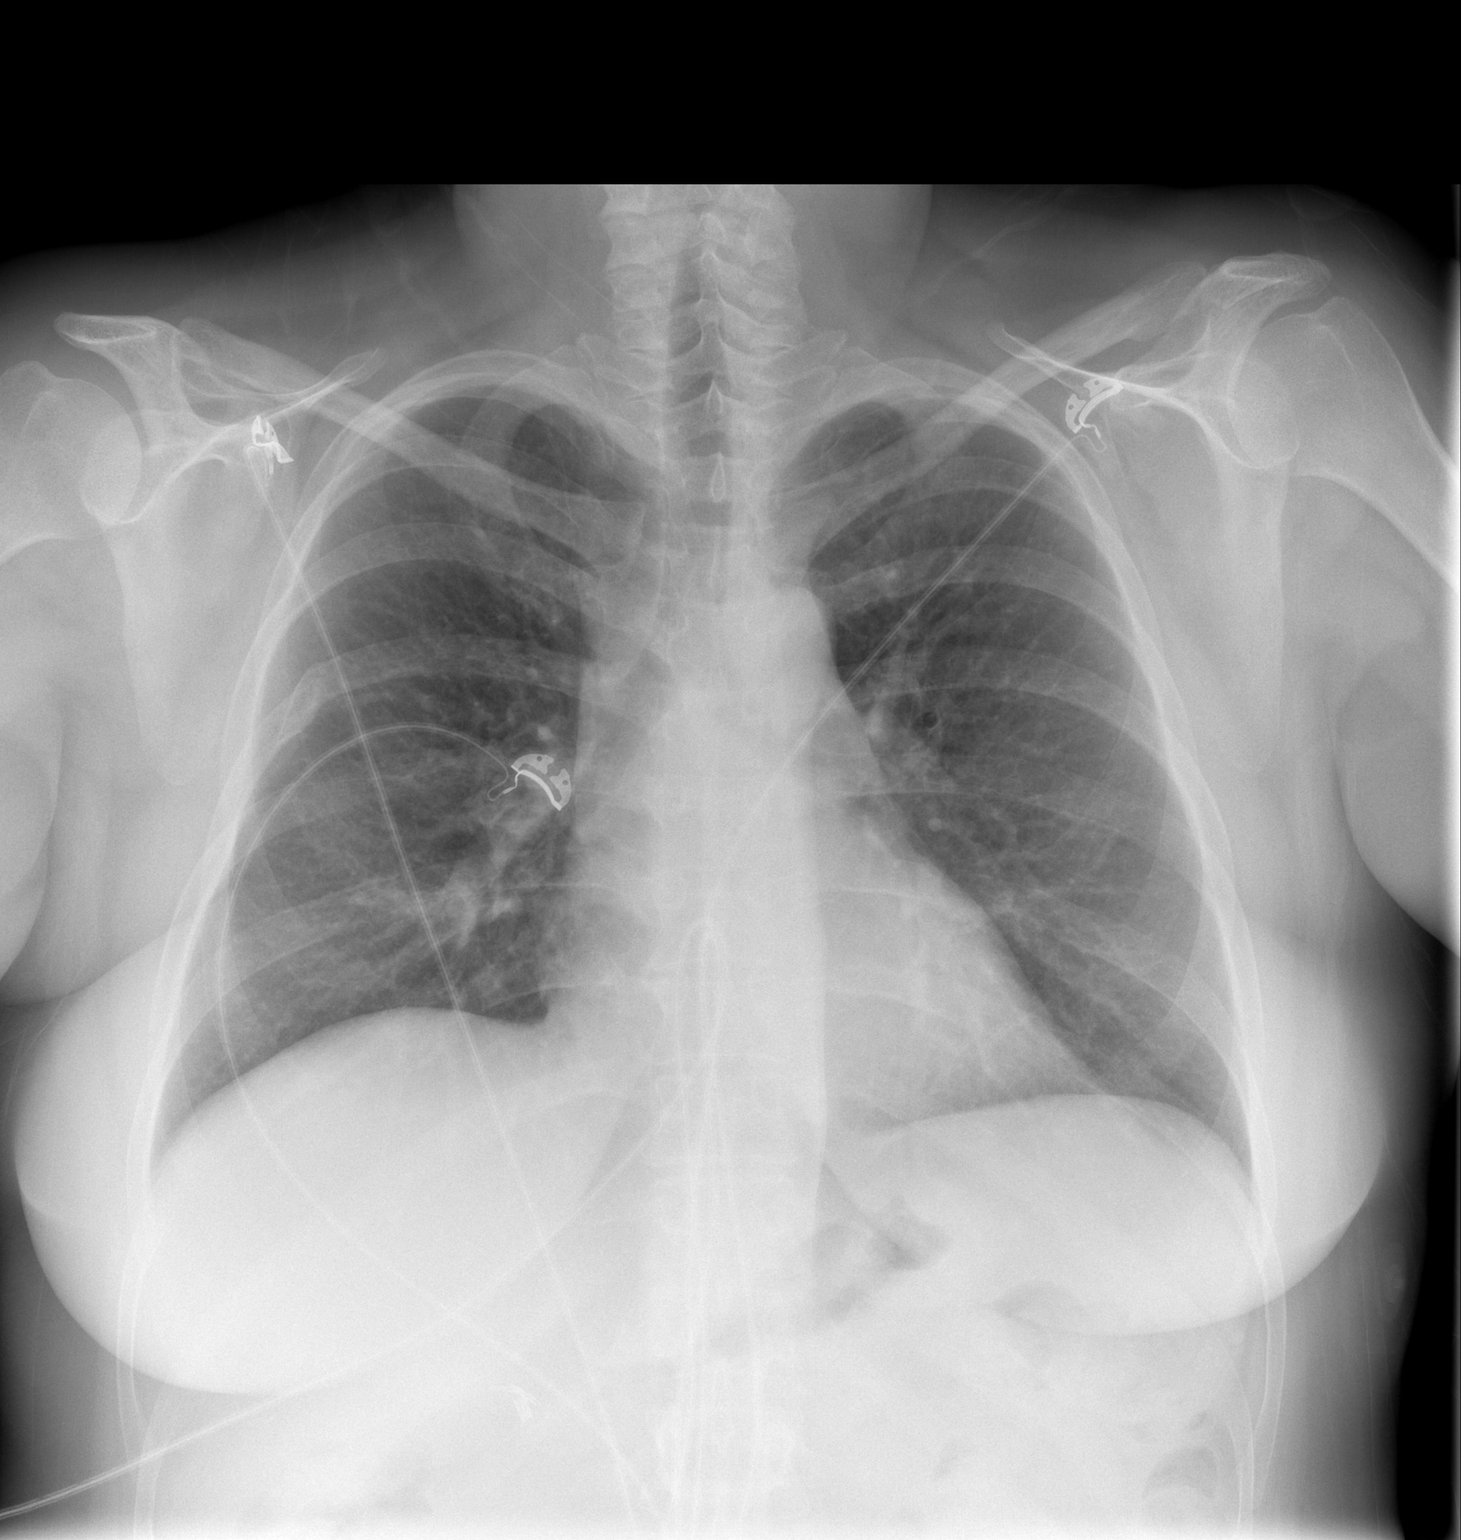

[w chest lat]
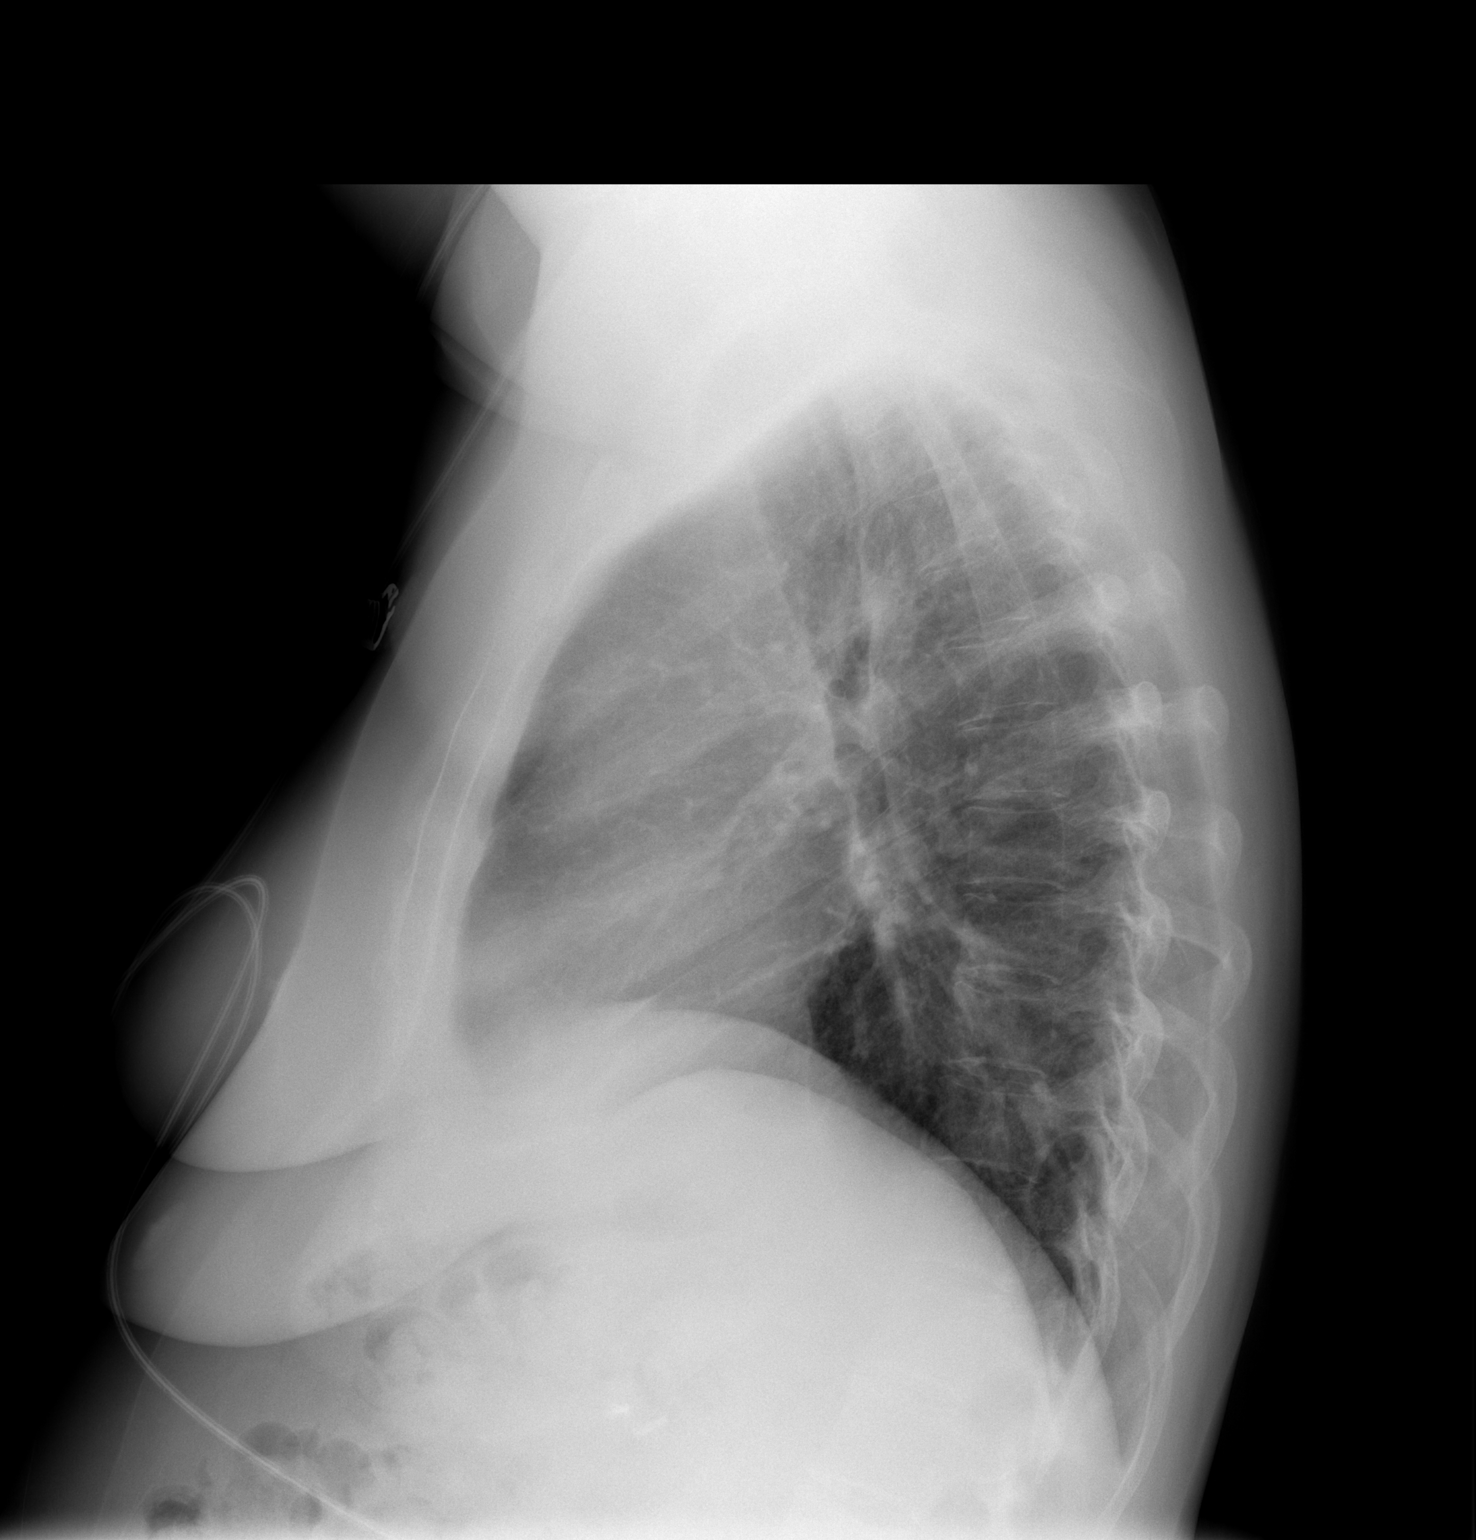

[2 of 2 positions shown; findings below may reference images not displayed]

FINDINGS: Lung volumes are stable, low normal. Normal cardiac size and
mediastinal contours. Visualized tracheal air column is within
normal limits. Lung parenchyma stable and clear. No pneumothorax or
pleural effusion. Stable cholecystectomy clips. No acute osseous
abnormality identified.
IMPRESSION: Negative, no acute cardiopulmonary abnormality.

## 2015-10-18 ENCOUNTER — Encounter: Payer: Self-pay | Admitting: *Deleted

## 2015-10-19 ENCOUNTER — Ambulatory Visit (INDEPENDENT_AMBULATORY_CARE_PROVIDER_SITE_OTHER): Payer: Medicare Other | Admitting: Family Medicine

## 2015-10-19 ENCOUNTER — Encounter: Payer: Self-pay | Admitting: Family Medicine

## 2015-10-19 VITALS — BP 134/83 | HR 102 | Wt 202.0 lb

## 2015-10-19 DIAGNOSIS — N9489 Other specified conditions associated with female genital organs and menstrual cycle: Secondary | ICD-10-CM | POA: Diagnosis not present

## 2015-10-19 DIAGNOSIS — C519 Malignant neoplasm of vulva, unspecified: Secondary | ICD-10-CM

## 2015-10-19 DIAGNOSIS — R000 Tachycardia, unspecified: Secondary | ICD-10-CM | POA: Diagnosis not present

## 2015-10-19 DIAGNOSIS — C4499 Other specified malignant neoplasm of skin, unspecified: Secondary | ICD-10-CM

## 2015-10-19 DIAGNOSIS — I10 Essential (primary) hypertension: Secondary | ICD-10-CM | POA: Diagnosis not present

## 2015-10-19 DIAGNOSIS — R300 Dysuria: Secondary | ICD-10-CM

## 2015-10-19 DIAGNOSIS — N898 Other specified noninflammatory disorders of vagina: Secondary | ICD-10-CM

## 2015-10-19 DIAGNOSIS — M25562 Pain in left knee: Secondary | ICD-10-CM

## 2015-10-19 DIAGNOSIS — R2681 Unsteadiness on feet: Secondary | ICD-10-CM

## 2015-10-19 DIAGNOSIS — M25561 Pain in right knee: Secondary | ICD-10-CM

## 2015-10-19 DIAGNOSIS — D649 Anemia, unspecified: Secondary | ICD-10-CM | POA: Diagnosis not present

## 2015-10-19 LAB — WET PREP FOR TRICH, YEAST, CLUE
Clue Cells Wet Prep HPF POC: NONE SEEN
Trich, Wet Prep: NONE SEEN
WBC, Wet Prep HPF POC: NONE SEEN
Yeast Wet Prep HPF POC: NONE SEEN

## 2015-10-19 LAB — FSH/LH
FSH: 49.7
LH: 27.4

## 2015-10-19 LAB — POCT URINALYSIS DIPSTICK
Bilirubin, UA: NEGATIVE
Blood, UA: NEGATIVE
Glucose, UA: NEGATIVE
Ketones, UA: NEGATIVE
Leukocytes, UA: NEGATIVE
Nitrite, UA: NEGATIVE
Protein, UA: NEGATIVE
Spec Grav, UA: 1.025
Urobilinogen, UA: 0.2
pH, UA: 5.5

## 2015-10-19 LAB — CBC AND DIFFERENTIAL
HCT: 41 % (ref 36–46)
Hemoglobin: 14.1 g/dL (ref 12.0–16.0)
Platelets: 205 10*3/uL (ref 150–399)
WBC: 5.7 10^3/mL

## 2015-10-19 LAB — PROGESTERONE: Progesterone: 0.4

## 2015-10-19 LAB — TSH: TSH: 2.11 u[IU]/mL (ref <=5.90)

## 2015-10-19 LAB — BASIC METABOLIC PANEL
BUN: 14 mg/dL (ref 4–21)
Creatinine: 0.5 mg/dL (ref <=1.1)
Glucose: 99 mg/dL
Potassium: 4.2 mmol/L (ref 3.4–5.3)
Sodium: 143 mmol/L (ref 137–147)

## 2015-10-19 LAB — ESTRADIOL: Estradiol: <20

## 2015-10-19 LAB — IRON: Iron: 79

## 2015-10-19 LAB — FERRITIN: Ferritin: 432.3

## 2015-10-19 NOTE — Progress Notes (Signed)
Subjective:    Patient ID: Jennifer [name], female    DOB: Mar 04, 1966, 50 y.o.   MRN: [id number]  HPI She is having problems with tachycardia as well.  They started her on sotalol. She has been on it for a week.  Cardiology recommended we check her thyroid, electrolytes and calcium. Will check CBC with iron.  She wants to check her labs at [facility]. Went to the ED a few days ago and her pulse was in the 140s.  She has noticed a benefit so far on the sotalol.  Though still have some side effects.   She hasn't been able to exercise.  She is having balance issues again.   Has had some right and left low back pain and some dysuria for severeal days.  No hematuria.  No fever.  She has noticed a vaginal odor as well.  She does complain of some suprapubic discomfort.  Has had a few night when she is having some pain from mid thigh down to feet on both sides. Seems to bother her mostly at night.   Still needs to f/u with gyn/onc.     Saw Dr. Vicente Masson from ENT and they discussed turbinate surgery.  She is Thinking about having it done in early summer.  Constipation-she's actually been moving her bowels more normally which she is happy about. That she is still having some suprapubic discomfort.  Review of Systems     Objective:   Physical Exam  Constitutional: She is oriented to person, place, and time. She appears well-developed and well-nourished.  HENT:  Head: Normocephalic and atraumatic.  Eyes: Conjunctivae and EOM are normal.  Cardiovascular: Normal rate.   Pulmonary/Chest: Effort normal.  Abdominal: Soft. Bowel sounds are normal. She exhibits no distension and no mass. There is tenderness. There is no rebound and no guarding.  Mild suprapubic tenderness   Musculoskeletal:  Normal lumbar flexion, extension, rotation right and left and side bending tender over the lower lumbar spine and the right lumbar paraspinous muscles. No CVA tenderness.    Neurological: She is alert and oriented to  person, place, and time.  Skin: Skin is dry. No pallor.  Psychiatric: She has a normal mood and affect. Her behavior is normal.  Vitals reviewed.         Assessment & Plan:  Tachycardia-overall she is actually doing well the sotalol. She does feel like she's having some side effects but she has noticed improvement in her blood pressure and her pulse which I think is fantastic. Strongly encouraged her to stick with this medication for a while. Especially if she seeing the benefits. She will 40 out follow-up with cardiology soon.   Low back pain-most consistent with musculoskeletal pain. I'm able to re-create her discomfort pressing on her lumbar spine. Recommend home stretches. Handout provided. We did go ahead and do a urinalysis just to rule out UTI since she has had some suprapubic discomfort as well as some pressure with urination. Urinalysis was negative but we'll send for culture for confirmation.  Gait instability-she really needs to follow back up with her neurologist. Not she might have MS. She does have waxing and waning of lower extremity strength as well as gait instability.  Bilateral leg pain-unclear etiology. It seems to happen at night when she's lying down so it seems to be positional. Certainly she is due to check electrolytes as well as a TSH as well as a CBC to weigh hemoglobin. Will rule out any underlying  cause. Consider that this could be coming from her lumbar spine as well.  Pagets disease of the vulva-she needs to follow back up with her gynecologist oncologist. She really should be evaluated yearly and strongly encouraged her to make this appointment.  Deviated septum-planning for surgery this summer.  Time spent 30 minutes, greater than 50% time spent counseling about tachycardia, gait insulin, leg pain, low back pain etc.

## 2015-10-20 ENCOUNTER — Other Ambulatory Visit: Payer: Self-pay | Admitting: *Deleted

## 2015-10-20 ENCOUNTER — Encounter: Payer: Self-pay | Admitting: Family Medicine

## 2015-10-20 ENCOUNTER — Telehealth: Payer: Self-pay | Admitting: Family Medicine

## 2015-10-20 DIAGNOSIS — I10 Essential (primary) hypertension: Secondary | ICD-10-CM | POA: Diagnosis not present

## 2015-10-20 DIAGNOSIS — E269 Hyperaldosteronism, unspecified: Secondary | ICD-10-CM

## 2015-10-20 DIAGNOSIS — G894 Chronic pain syndrome: Secondary | ICD-10-CM | POA: Diagnosis not present

## 2015-10-20 DIAGNOSIS — G35 Multiple sclerosis: Secondary | ICD-10-CM | POA: Diagnosis not present

## 2015-10-20 DIAGNOSIS — R61 Generalized hyperhidrosis: Secondary | ICD-10-CM | POA: Diagnosis not present

## 2015-10-20 NOTE — Telephone Encounter (Signed)
Pt informed.Jennifer [name]  

## 2015-10-20 NOTE — Telephone Encounter (Addendum)
Please call patient and let her know that her basic metabolic panel looks good. Potassium I also looks good. Iron stores look great. Thyroid level looks great too.  Hormone levels are consistent with being postmenopausal. Nothing unusual.

## 2015-10-21 ENCOUNTER — Encounter: Payer: Self-pay | Admitting: Family Medicine

## 2015-10-21 DIAGNOSIS — I10 Essential (primary) hypertension: Secondary | ICD-10-CM | POA: Diagnosis not present

## 2015-10-21 DIAGNOSIS — R1084 Generalized abdominal pain: Secondary | ICD-10-CM | POA: Diagnosis not present

## 2015-10-21 DIAGNOSIS — I701 Atherosclerosis of renal artery: Secondary | ICD-10-CM | POA: Diagnosis not present

## 2015-10-21 DIAGNOSIS — R269 Unspecified abnormalities of gait and mobility: Secondary | ICD-10-CM | POA: Diagnosis not present

## 2015-10-21 LAB — URINE CULTURE: Colony Count: 40000

## 2015-10-25 ENCOUNTER — Emergency Department (HOSPITAL_BASED_OUTPATIENT_CLINIC_OR_DEPARTMENT_OTHER)
Admission: EM | Admit: 2015-10-25 | Discharge: 2015-10-25 | Disposition: A | Discharge status: Home / Self Care | Payer: Medicare Other | Attending: Emergency Medicine | Admitting: Emergency Medicine

## 2015-10-25 DIAGNOSIS — R110 Nausea: Secondary | ICD-10-CM | POA: Diagnosis not present

## 2015-10-25 DIAGNOSIS — M79604 Pain in right leg: Secondary | ICD-10-CM | POA: Diagnosis not present

## 2015-10-25 DIAGNOSIS — R51 Headache: Secondary | ICD-10-CM | POA: Diagnosis not present

## 2015-10-25 DIAGNOSIS — F4310 Post-traumatic stress disorder, unspecified: Secondary | ICD-10-CM | POA: Insufficient documentation

## 2015-10-25 DIAGNOSIS — Z79899 Other long term (current) drug therapy: Secondary | ICD-10-CM | POA: Diagnosis not present

## 2015-10-25 DIAGNOSIS — Z7952 Long term (current) use of systemic steroids: Secondary | ICD-10-CM | POA: Diagnosis not present

## 2015-10-25 DIAGNOSIS — M79605 Pain in left leg: Secondary | ICD-10-CM | POA: Diagnosis not present

## 2015-10-25 DIAGNOSIS — G35 Multiple sclerosis: Secondary | ICD-10-CM | POA: Insufficient documentation

## 2015-10-25 DIAGNOSIS — M545 Low back pain, unspecified: Secondary | ICD-10-CM

## 2015-10-25 DIAGNOSIS — M79606 Pain in leg, unspecified: Secondary | ICD-10-CM

## 2015-10-25 DIAGNOSIS — I10 Essential (primary) hypertension: Secondary | ICD-10-CM | POA: Insufficient documentation

## 2015-10-25 DIAGNOSIS — Z8639 Personal history of other endocrine, nutritional and metabolic disease: Secondary | ICD-10-CM | POA: Insufficient documentation

## 2015-10-25 DIAGNOSIS — Z8541 Personal history of malignant neoplasm of cervix uteri: Secondary | ICD-10-CM | POA: Insufficient documentation

## 2015-10-25 DIAGNOSIS — Z87891 Personal history of nicotine dependence: Secondary | ICD-10-CM | POA: Diagnosis not present

## 2015-10-25 DIAGNOSIS — J45909 Unspecified asthma, uncomplicated: Secondary | ICD-10-CM | POA: Diagnosis not present

## 2015-10-25 DIAGNOSIS — G8929 Other chronic pain: Secondary | ICD-10-CM | POA: Insufficient documentation

## 2015-10-25 DIAGNOSIS — Z8719 Personal history of other diseases of the digestive system: Secondary | ICD-10-CM | POA: Diagnosis not present

## 2015-10-25 DIAGNOSIS — M549 Dorsalgia, unspecified: Secondary | ICD-10-CM | POA: Diagnosis present

## 2015-10-25 LAB — BASIC METABOLIC PANEL
Anion gap: 9 (ref 5–15)
BUN: 16 mg/dL (ref 6–20)
CO2: 25 mmol/L (ref 22–32)
Calcium: 8.9 mg/dL (ref 8.9–10.3)
Chloride: 106 mmol/L (ref 101–111)
Creatinine, Ser: 0.53 mg/dL (ref 0.44–1.00)
GFR calc Af Amer: >60 mL/min (ref >60)
GFR calc non Af Amer: >60 mL/min (ref >60)
Glucose, Bld: 100 mg/dL — ABNORMAL HIGH (ref 65–99)
Potassium: 4.1 mmol/L (ref 3.5–5.1)
Sodium: 140 mmol/L (ref 135–145)

## 2015-10-25 LAB — URINALYSIS, ROUTINE W REFLEX MICROSCOPIC
Bilirubin Urine: NEGATIVE
Glucose, UA: NEGATIVE mg/dL
Hgb urine dipstick: NEGATIVE
Ketones, ur: NEGATIVE mg/dL
Leukocytes, UA: NEGATIVE
Nitrite: NEGATIVE
Protein, ur: NEGATIVE mg/dL
Specific Gravity, Urine: 1.023 (ref 1.005–1.030)
pH: 6.5 (ref 5.0–8.0)
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
## 2015-10-25 NOTE — Discharge Instructions (Signed)

## 2015-10-25 NOTE — ED Notes (Signed)
Pt reports starting new BP meds last week.  Reports leg weakness, back pain since that time.  Pt ambulatory without difficulty.  No acute distress noted.

## 2015-10-28 ENCOUNTER — Ambulatory Visit (INDEPENDENT_AMBULATORY_CARE_PROVIDER_SITE_OTHER): Payer: Medicare Other | Admitting: Osteopathic Medicine

## 2015-10-28 ENCOUNTER — Telehealth: Payer: Self-pay | Admitting: *Deleted

## 2015-10-28 VITALS — BP 165/84 | HR 90 | Ht 62.0 in | Wt 205.0 lb

## 2015-10-28 DIAGNOSIS — F458 Other somatoform disorders: Secondary | ICD-10-CM | POA: Diagnosis not present

## 2015-10-28 DIAGNOSIS — R0989 Other specified symptoms and signs involving the circulatory and respiratory systems: Secondary | ICD-10-CM

## 2015-10-28 DIAGNOSIS — R209 Unspecified disturbances of skin sensation: Secondary | ICD-10-CM | POA: Diagnosis not present

## 2015-10-28 DIAGNOSIS — Z87891 Personal history of nicotine dependence: Secondary | ICD-10-CM | POA: Diagnosis not present

## 2015-10-28 DIAGNOSIS — Z886 Allergy status to analgesic agent status: Secondary | ICD-10-CM | POA: Diagnosis not present

## 2015-10-28 DIAGNOSIS — R198 Other specified symptoms and signs involving the digestive system and abdomen: Secondary | ICD-10-CM

## 2015-10-28 DIAGNOSIS — G35 Multiple sclerosis: Secondary | ICD-10-CM | POA: Diagnosis not present

## 2015-10-28 DIAGNOSIS — Z888 Allergy status to other drugs, medicaments and biological substances status: Secondary | ICD-10-CM | POA: Diagnosis not present

## 2015-10-28 DIAGNOSIS — M549 Dorsalgia, unspecified: Secondary | ICD-10-CM | POA: Diagnosis not present

## 2015-10-28 DIAGNOSIS — G8929 Other chronic pain: Secondary | ICD-10-CM | POA: Diagnosis not present

## 2015-10-28 DIAGNOSIS — R202 Paresthesia of skin: Secondary | ICD-10-CM | POA: Diagnosis not present

## 2015-10-28 DIAGNOSIS — R1310 Dysphagia, unspecified: Secondary | ICD-10-CM | POA: Diagnosis not present

## 2015-10-28 DIAGNOSIS — I10 Essential (primary) hypertension: Secondary | ICD-10-CM | POA: Diagnosis not present

## 2015-10-28 DIAGNOSIS — M79673 Pain in unspecified foot: Secondary | ICD-10-CM | POA: Diagnosis not present

## 2015-10-28 DIAGNOSIS — Z885 Allergy status to narcotic agent status: Secondary | ICD-10-CM | POA: Diagnosis not present

## 2015-10-28 DIAGNOSIS — D649 Anemia, unspecified: Secondary | ICD-10-CM | POA: Diagnosis not present

## 2015-10-28 DIAGNOSIS — M79674 Pain in right toe(s): Secondary | ICD-10-CM | POA: Diagnosis not present

## 2015-10-28 LAB — BASIC METABOLIC PANEL
BUN: 24 mg/dL — AB (ref 4–21)
Creatinine: 0.5 mg/dL (ref 0.5–1.1)
Glucose: 120 mg/dL
Potassium: 4.1 mmol/L (ref 3.4–5.3)
Sodium: 141 mmol/L (ref 137–147)

## 2015-10-28 MED ORDER — DEXAMETHASONE SODIUM PHOSPHATE 4 MG/ML IJ SOLN: 4.0000 mg | Freq: Once | INTRAMUSCULAR | Status: DC

## 2015-10-28 MED ORDER — DEXAMETHASONE SODIUM PHOSPHATE 4 MG/ML IJ SOLN
4.0000 mg | Freq: Once | INTRAMUSCULAR | Status: AC
  Administered 2015-10-28: 4 mg via INTRAMUSCULAR

## 2015-10-28 NOTE — Progress Notes (Signed)
HPI: Jennifer [name] is a 50 y.o. female who presents to [place]  today for chief complaint of:  Chief Complaint  Patient presents with  . Allergic Reaction   ALLERGIC REACTION EVALUATION  Symptoms: feels like food sitting in the throat, tried to make herself throw up to feel better. Over past 2 days noticed drooling a bit while awake and feeling swollen throat and pain. Eating ok today.   Substance ingested/exposed? Meatloaf, oatmeal the following morning, not sure what triggered, ate burger another day and felt "hung up" in the throat.  Recent medications changed - labetalol.  Time ingested/exposed? 2 days ago   Danger signs present...   Rapid progression of symptoms? No   Respiratory distress (eg, stridor, wheezing, dyspnea, increased work of breathing, persistent cough, cyanosis)? No   Vomiting? No   Abdominal pain? No   Hypotension? No   Dysrhythmia? No   Chest pain? No   Collapse? No   Previous history of ENT w/u for similar symptoms, required dilations and botox injections into the throat. Patient cannot recall specific diagnosis given to her by ENT, states that they did a test where they measured her swallowing while she was eating something and she said something in her throat wasn't working properly, she needed Botox injections but her previous ENT is no longer working at that office and she does not like any of the other doctors there.  Known allergies include... Allergies  Allergen Reactions  . Coreg [Carvedilol] Shortness Of Breath    CP  . Mushroom Extract Complex Anaphylaxis  . Nitrofurantoin Shortness Of Breath    REACTION: sweats  . Promethazine Hcl Anaphylaxis    jittery  . Telmisartan Swelling  . Adhesive [Tape]     EKG monitor patches, some tapes"reddnes,blisters"  . Aspirin Other (See Comments)    flushing  . Atenolol Other (See Comments)    Squeezing chest sensation  . Avelox [Moxifloxacin Hcl In Nacl]  Itching and Other (See Comments)    Shortness of breath  . Azithromycin Other (See Comments)    Lip swelling, SOB.   . Beta Adrenergic Blockers     Feels like chest tighting "Metoprolol"  . Butorphanol Tartrate     REACTION: unknown  . Butorphanol Tartrate Other (See Comments)    Patient aggitated  . Cetirizine Other (See Comments)    Broke out in hives the day before, took Zyrtec &  wasn't sure if this made it worse  . Ciprofloxacin     REACTION: tongue swells  . Clonidine Hydrochloride     REACTION: makes blood pressure high  . Cortisone   . Cyprodenate Itching  . Doxycycline   . Fentanyl     High [facility] record   . Fluoxetine Hcl Other (See Comments)    REACTION: headaches  . Iron   . Ketorolac Other (See Comments)    I can't sit still  . Ketorolac Tromethamine     jittery  . Lidocaine Other (See Comments)    "It messes me up".  "I can't take it."  . Lisinopril Cough    REACTION: cough  . Metoclopramide Hcl Other (See Comments)    Has a twitchy feeling  . Metoprolol     Other reaction(s): OTHER  . Milk-Related Compounds   . Montelukast Sodium     Unknown"Singulair"  . Naproxen   . Paroxetine Other (See Comments)    REACTION: headaches  . Pravastatin Other (See Comments)  Myalgias  . Promethazine Other (See Comments)    I can't sit still  . Sertraline Hcl     REACTION: headaches  . Spironolactone   . Stelazine [Trifluoperazine]   . Tobramycin     itching , rash  . Trifluoperazine Hcl     REACTION: unknown  . Vancomycin     Other reaction(s): Other (See Comments), Unknown Other Reaction: all mycins  . Versed [Midazolam]     High [facility] medical record  . Ceftriaxone Sodium Rash  . Erythromycin Rash  . Metronidazole Rash  . Penicillins Rash  . Prochlorperazine Anxiety  . Quinolones Rash and Swelling  . Sulfonamide Derivatives Rash  . Venlafaxine Anxiety  . Zyrtec [Cetirizine Hcl] Rash    All over body      Past medical,  social and family history reviewed. Current medications and allergies reviewed.   Review of Systems: CONSTITUTIONAL:  No  fever, no chills HEAD/EYES/EARS/NOSE/THROAT: No  headache, no vision change, no hearing change, No sore throat, (+) feeling of stuck in throat as per HPI CARDIAC: No  chest pain, No  pressure, No palpitations, No  orthopnea RESPIRATORY: No cough, No  shortness of breath/wheeze GASTROINTESTINAL: No  nausea, No  vomiting, No  abdominal pain, (+) abdominal bloating SKIN: No  rash/wounds/concerning lesions NEUROLOGIC: No  weakness, No  dizziness, No  slurred speech    Exam:  BP 165/84 mmHg  Pulse 90  Ht 5\' 2"  (1.575 m)  Wt 205 lb (92.987 kg)  BMI 37.49 kg/m2  LMP 06/25/2013 Constitutional: VSS, see above. General Appearance: alert, well-developed, well-nourished, NAD Eyes: Normal lids and conjunctive, non-icteric sclera Ears, Nose, Mouth, Throat: Normal external inspection ears/nares/mouth/lips/gums, MMM;       posterior pharynx without erythema, without exudate, without edema Neck: No masses, trachea midline. normal lymph nodes Respiratory: Normal respiratory effort. No  wheeze/rhonchi/rales   ASSESSMENT/PLAN: Patient does not show any signs of anaphylactic reaction, most likely globus sensation versus other swallowing dysfunction, would consider barium swallow but patient says she doesn't really want to do this, consider ENT referral if no better, injection of Decadron given today to hopefully calm down any inflammation however patient is strongly advised that she may need to follow-up with [name] and throat for further workup and treatment. ER/RTC precautions reviewed. Follow-up with Dr. Madilyn Fireman to recheck blood pressure, elevated today but patient reports significant stress/anxiety, reviewed recent results and BP ok.   Globus sensation - Plan: dexamethasone (DECADRON) injection 4 mg  Multiple sclerosis ([facility])  Swallowing dysfunction - Plan: dexamethasone  (DECADRON) injection 4 mg, dexamethasone (DECADRON) injection 4 mg   Return if symptoms worsen or fail to improve, and as directed by Dr. Madilyn Fireman.

## 2015-10-28 NOTE — Patient Instructions (Signed)
If you are no better with steroid treatment after a few days, we ill need to send you back to ENT for further workup and treatment for swallowing dysfunction.  If you are concerned about a severe allergic reaction at any point, like if you are having difficulty breathing, severe dizziness or lightheadedness, rash or other concerns, you should be seen in an emergency room ASAP.   Dysphagia Swallowing problems (dysphagia) occur when solids and liquids seem to stick in your throat on the way down to your stomach, or the food takes longer to get to the stomach. Other symptoms include regurgitating food, noises coming from the throat, chest discomfort with swallowing, and a feeling of fullness or the feeling of something being stuck in your throat when swallowing. When blockage in your throat is complete, it may be associated with drooling. CAUSES  Problems with swallowing may occur because of problems with the muscles. The food cannot be propelled in the usual manner into your stomach. You may have ulcers, scar tissue, or inflammation in the tube down which food travels from your mouth to your stomach (esophagus), which blocks food from passing normally into the stomach. Causes of inflammation include:  Acid reflux from your stomach into your esophagus.  Infection.  Radiation treatment for cancer.  Medicines taken without enough fluids to wash them down into your stomach. You may have nerve problems that prevent signals from being sent to the muscles of your esophagus to contract and move your food down to your stomach. Globus pharyngeus is a relatively common problem in which there is a sense of an obstruction or difficulty in swallowing, without any physical abnormalities of the swallowing passages being found. This problem usually improves over time with reassurance and testing to rule out other causes. DIAGNOSIS Dysphagia can be diagnosed and its cause can be determined by tests in which you swallow  a white substance that helps illuminate the inside of your throat (contrast medium) while X-rays are taken. Sometimes a flexible telescope that is inserted down your throat (endoscopy) to look at your esophagus and stomach is used. TREATMENT   If the dysphagia is caused by acid reflux or infection, medicines may be used.  If the dysphagia is caused by problems with your swallowing muscles, swallowing therapy may be used to help you strengthen your swallowing muscles.  If the dysphagia is caused by a blockage or mass, procedures to remove the blockage may be done. HOME CARE INSTRUCTIONS  Try to eat soft food that is easier to swallow and check your weight on a daily basis to be sure that it is not decreasing.  Be sure to drink liquids when sitting upright (not lying down). SEEK MEDICAL CARE IF:  You are losing weight because you are unable to swallow.  You are coughing when you drink liquids (aspiration).  You are coughing up partially digested food. SEEK IMMEDIATE MEDICAL CARE IF:  You are unable to swallow your own saliva .  You are having shortness of breath or a fever, or both.  You have a hoarse voice along with difficulty swallowing. MAKE SURE YOU:  Understand these instructions.  Will watch your condition.  Will get help right away if you are not doing well or get worse.   This information is not intended to replace advice given to you by your health care provider. Make sure you discuss any questions you have with your health care provider.   Document Released: 07/28/2000 Document Revised: 08/21/2014 Document Reviewed: 01/17/2013  Chartered certified accountant Patient Education [employer].

## 2015-10-28 NOTE — Telephone Encounter (Signed)
Called pt at her request. She had some additional questions. lvm for her to rtn call.Jennifer [name] [place]

## 2015-10-29 ENCOUNTER — Encounter: Payer: Self-pay | Admitting: Family Medicine

## 2015-10-29 NOTE — Telephone Encounter (Signed)
Left message for patient to call office for recommendations.

## 2015-10-29 NOTE — Telephone Encounter (Signed)
Pt is to see you Tuesday.  She is trying to get an appointment with Dr. Darene Lamer or Georgina Snell around the same time.

## 2015-10-29 NOTE — Telephone Encounter (Signed)
Patient is requesting MRI of her spine. She will need to be seen for this. Recommend that she sees one of our sports medicine providers for further evaluation and to see if she may need a further workup including an MRI. In addition I did receive her lab report. Her electrolytes look great including her potassium. Symmetric sure to continue the potassium supplement. Jennifer [name] function is stable. Calcium is normal.

## 2015-10-30 IMAGING — CR DG CHEST 2V
2 series · 2 of 2 positions shown · non-contrast
Comparison: 08/04/2014

CLINICAL DATA: Shortness of breath, cough, congestion for 1 week

EXAM:
CHEST  2 VIEW

[w chest pa]
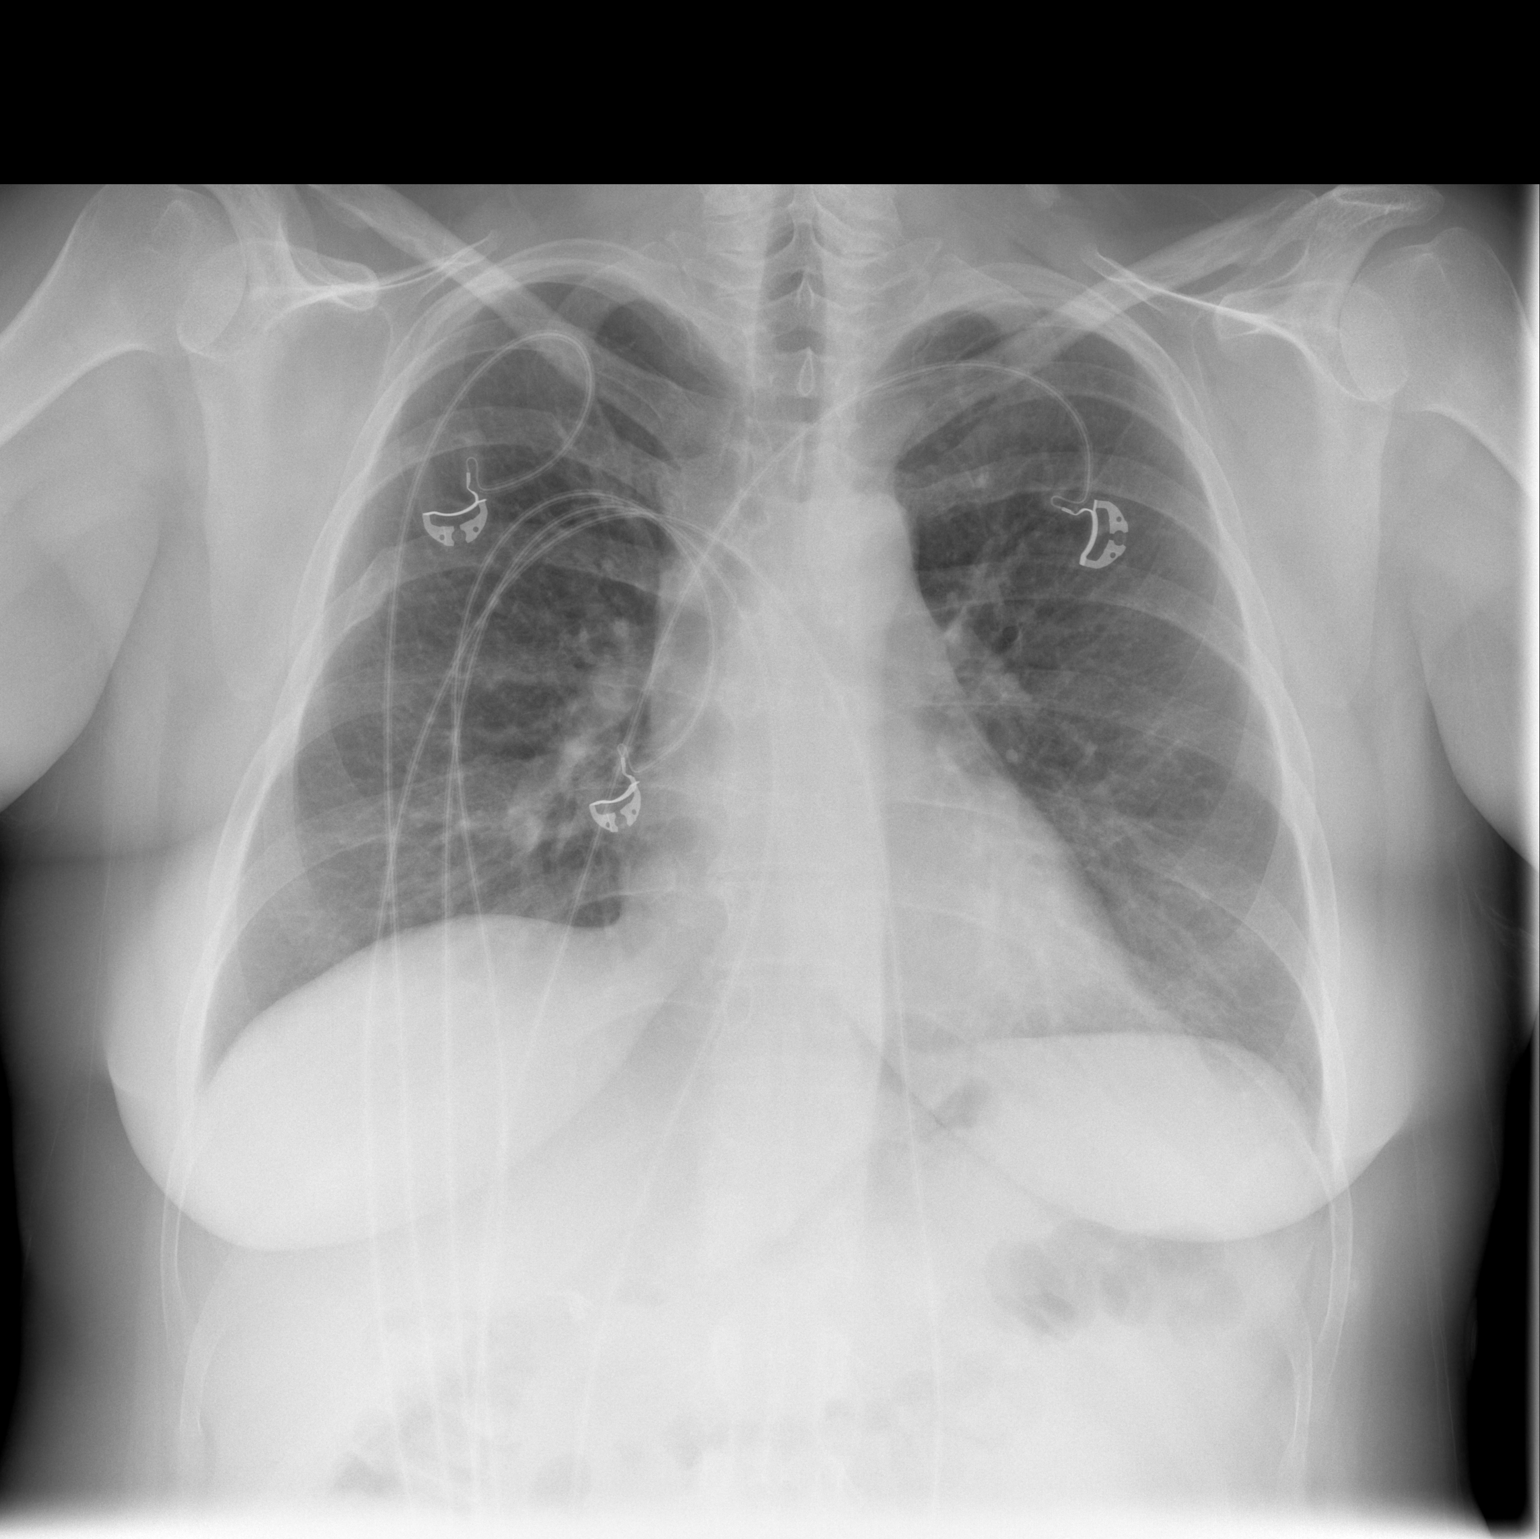

[w chest lat]
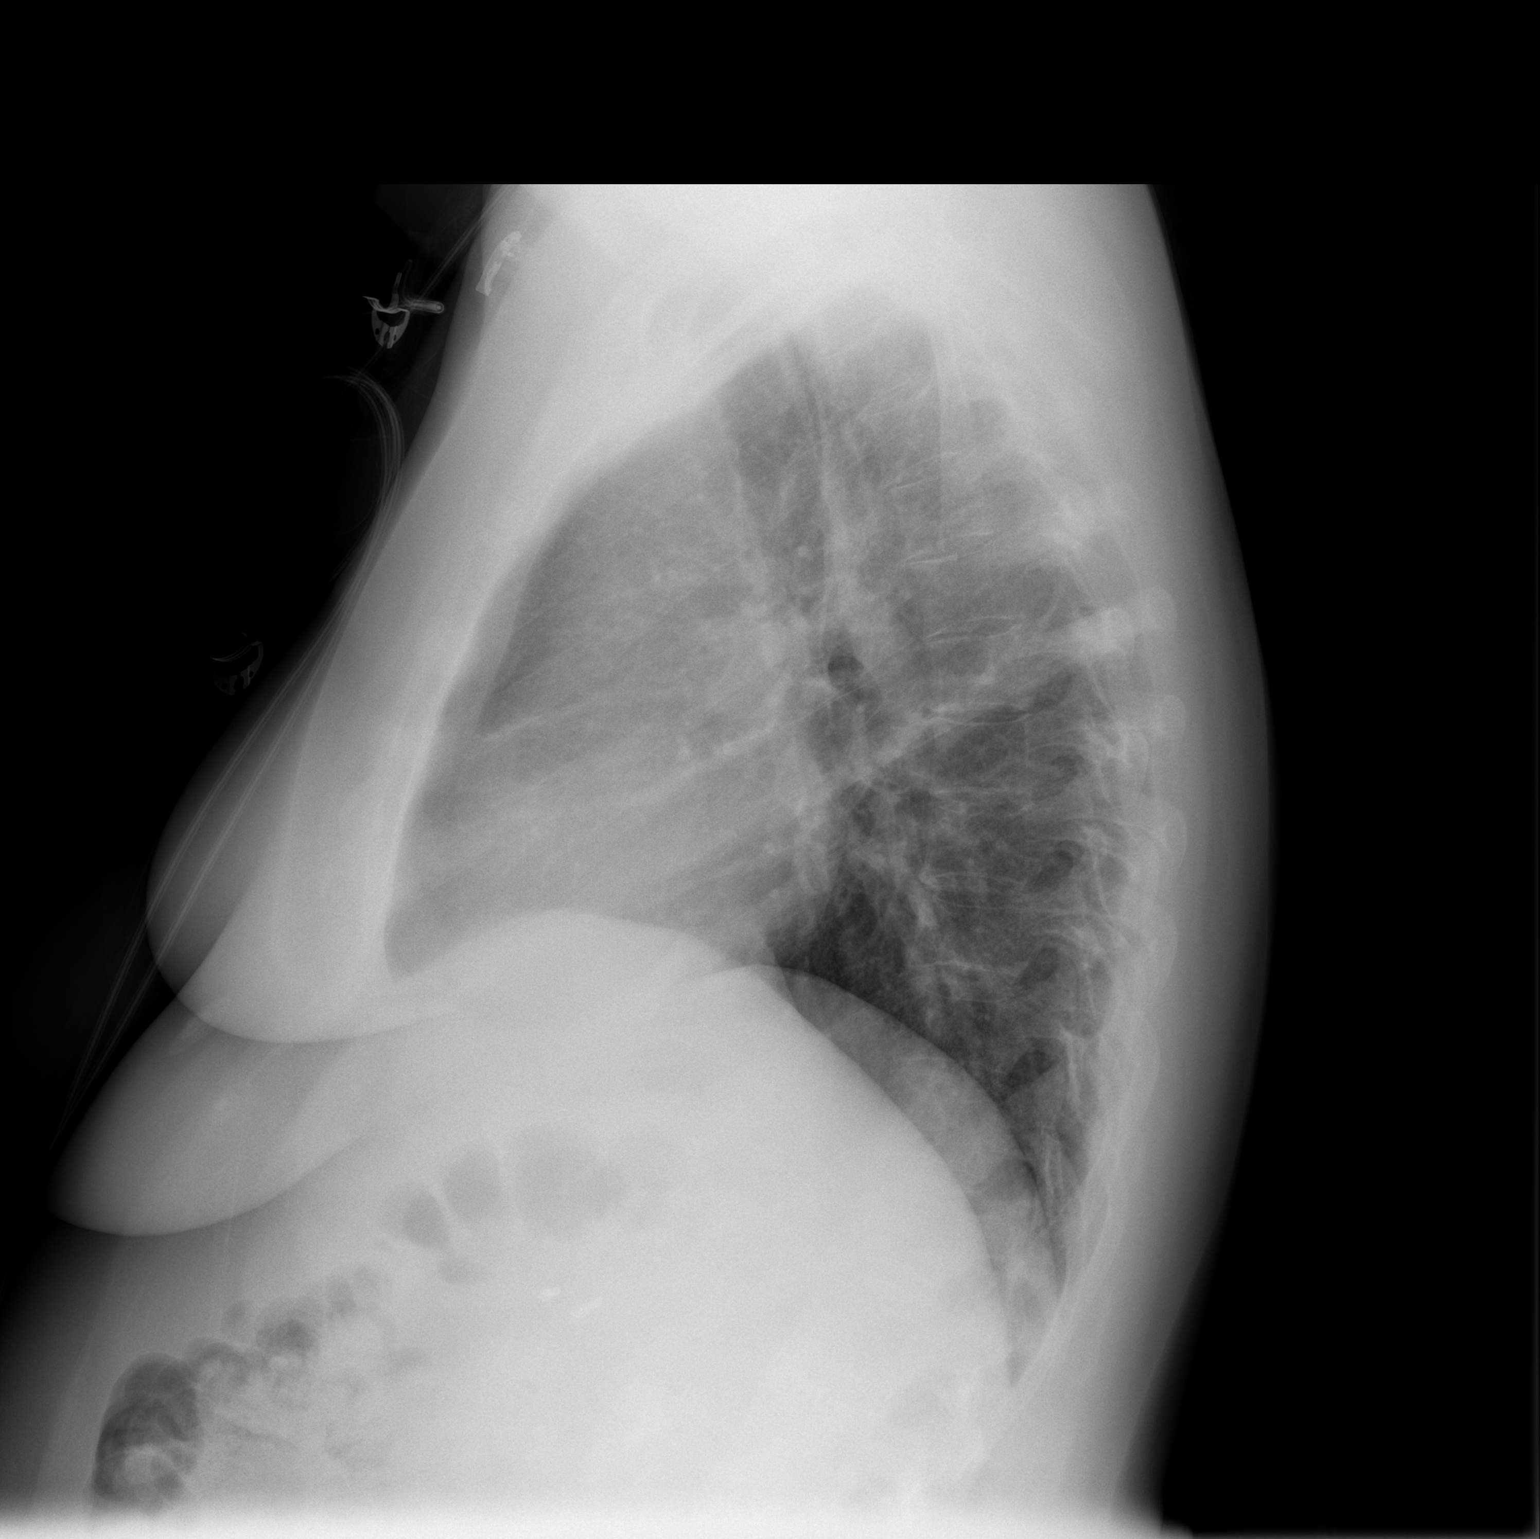

[2 of 2 positions shown; findings below may reference images not displayed]

FINDINGS: The heart size and mediastinal contours are within normal limits.
Both lungs are clear but hypoaerated with crowding of the
bronchovascular markings at the lung bases. Remote right lateral
sixth rib fracture reidentified. The visualized skeletal structures
are unremarkable.
IMPRESSION: No active cardiopulmonary disease.

## 2015-11-01 ENCOUNTER — Telehealth: Payer: Self-pay | Admitting: Family Medicine

## 2015-11-01 DIAGNOSIS — J45909 Unspecified asthma, uncomplicated: Secondary | ICD-10-CM | POA: Diagnosis not present

## 2015-11-01 DIAGNOSIS — G8929 Other chronic pain: Secondary | ICD-10-CM | POA: Diagnosis not present

## 2015-11-01 DIAGNOSIS — M79661 Pain in right lower leg: Secondary | ICD-10-CM | POA: Diagnosis not present

## 2015-11-01 DIAGNOSIS — R569 Unspecified convulsions: Secondary | ICD-10-CM | POA: Diagnosis not present

## 2015-11-01 DIAGNOSIS — Z880 Allergy status to penicillin: Secondary | ICD-10-CM | POA: Diagnosis not present

## 2015-11-01 DIAGNOSIS — I10 Essential (primary) hypertension: Secondary | ICD-10-CM | POA: Diagnosis not present

## 2015-11-01 DIAGNOSIS — M7989 Other specified soft tissue disorders: Secondary | ICD-10-CM | POA: Diagnosis not present

## 2015-11-01 DIAGNOSIS — Z87891 Personal history of nicotine dependence: Secondary | ICD-10-CM | POA: Diagnosis not present

## 2015-11-01 DIAGNOSIS — D509 Iron deficiency anemia, unspecified: Secondary | ICD-10-CM | POA: Diagnosis not present

## 2015-11-01 DIAGNOSIS — R202 Paresthesia of skin: Secondary | ICD-10-CM | POA: Diagnosis not present

## 2015-11-01 DIAGNOSIS — M79604 Pain in right leg: Secondary | ICD-10-CM | POA: Diagnosis not present

## 2015-11-01 DIAGNOSIS — R200 Anesthesia of skin: Secondary | ICD-10-CM | POA: Diagnosis not present

## 2015-11-01 DIAGNOSIS — G35 Multiple sclerosis: Secondary | ICD-10-CM | POA: Diagnosis not present

## 2015-11-01 DIAGNOSIS — G4733 Obstructive sleep apnea (adult) (pediatric): Secondary | ICD-10-CM | POA: Diagnosis not present

## 2015-11-01 NOTE — Telephone Encounter (Signed)
Call patient: Aldosterone level was slightly elevated at 24. [place] and activity was low at less than 0.6. Electrolytes were normal. Kidney function is stable. Calcium looks great.

## 2015-11-02 ENCOUNTER — Encounter: Payer: Self-pay | Admitting: Family Medicine

## 2015-11-02 ENCOUNTER — Ambulatory Visit (INDEPENDENT_AMBULATORY_CARE_PROVIDER_SITE_OTHER): Payer: Medicare Other | Admitting: Family Medicine

## 2015-11-02 ENCOUNTER — Ambulatory Visit (INDEPENDENT_AMBULATORY_CARE_PROVIDER_SITE_OTHER): Payer: Medicare Other | Admitting: Sports Medicine

## 2015-11-02 VITALS — BP 146/99 | HR 103 | Resp 18 | Wt 205.1 lb

## 2015-11-02 VITALS — BP 143/84 | HR 93 | Ht 62.0 in | Wt 205.0 lb

## 2015-11-02 DIAGNOSIS — R635 Abnormal weight gain: Secondary | ICD-10-CM | POA: Diagnosis not present

## 2015-11-02 DIAGNOSIS — I4729 Other ventricular tachycardia: Secondary | ICD-10-CM

## 2015-11-02 DIAGNOSIS — E876 Hypokalemia: Secondary | ICD-10-CM

## 2015-11-02 DIAGNOSIS — I472 Ventricular tachycardia: Secondary | ICD-10-CM

## 2015-11-02 DIAGNOSIS — M25551 Pain in right hip: Secondary | ICD-10-CM | POA: Diagnosis not present

## 2015-11-02 DIAGNOSIS — M1711 Unilateral primary osteoarthritis, right knee: Secondary | ICD-10-CM

## 2015-11-02 DIAGNOSIS — I10 Essential (primary) hypertension: Secondary | ICD-10-CM | POA: Diagnosis not present

## 2015-11-02 LAB — BASIC METABOLIC PANEL
BUN: 14 mg/dL (ref 4–21)
Creatinine: 0.6 mg/dL (ref <=1.1)
Glucose: 156 mg/dL
Potassium: 4 mmol/L (ref 3.4–5.3)
Sodium: 143 mmol/L (ref 137–147)

## 2015-11-02 IMAGING — CT CT ABD-PELV W/ CM
2 of 5 series · 16 of 46 positions shown, 18 images · IV contrast (omnipaque)
Comparison: 02/20/2014 Lung bases clear.

Gallbladder surgically absent.

Liver, spleen, pancreas,

CLINICAL DATA: Abdominal pain and distention, nausea and shortness
of breath for 4 days, cholecystectomy April 2014, history GERD,
asthma, uterine cancer, hypertension, multiple sclerosis

EXAM:
CT ABDOMEN AND PELVIS WITH CONTRAST
TECHNIQUE: Multidetector CT imaging of the abdomen and pelvis was performed
using the standard protocol following bolus administration of
intravenous contrast.
CONTRAST:  100mL OMNIPAQUE IOHEXOL 300 MG/ML SOLN, 50mL OMNIPAQUE
IOHEXOL 300 MG/ML SOLN

[Series 2: abd/pelvis 5.0 b31f · axial · 0.77mm/px · z∈[+635,+1040]mm · 13 of 91 slices shown, 15 images]
[im 5/91  soft-tissue]
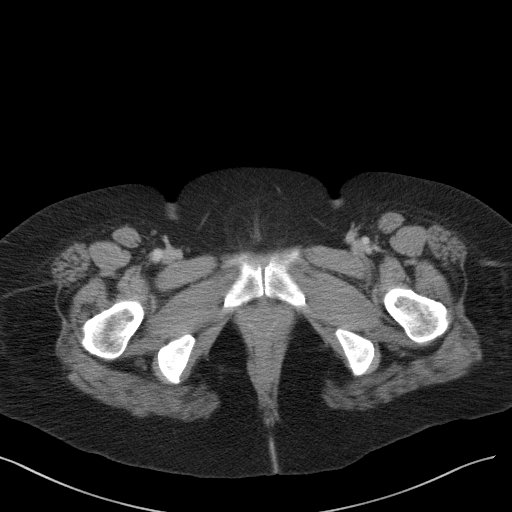
[im 5/91  bone]
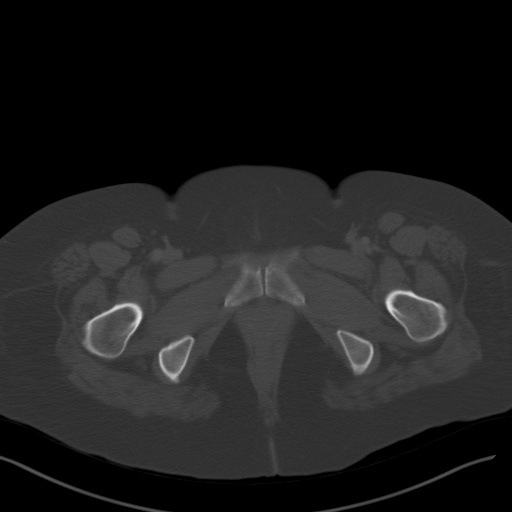
[im 15/91  soft-tissue]
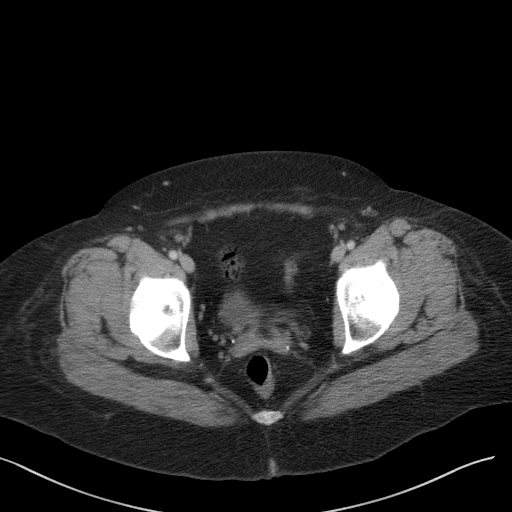
[im 19/91  soft-tissue]
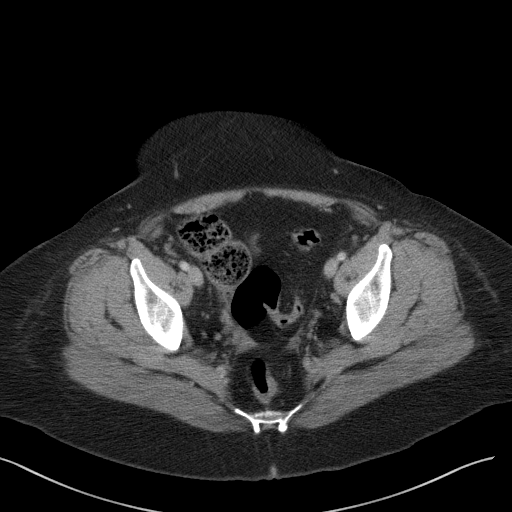
[im 24/91  soft-tissue]
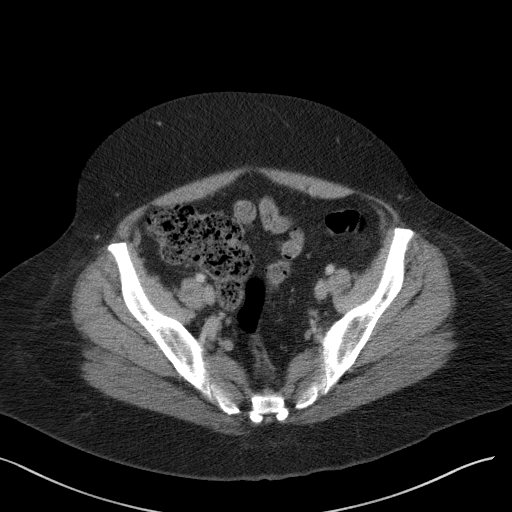
[im 34/91  soft-tissue]
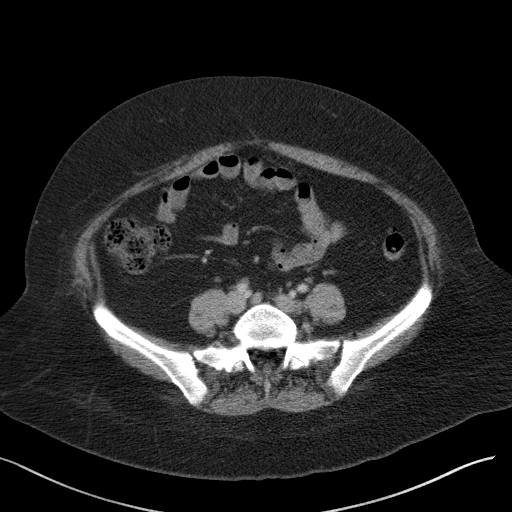
[im 38/91  soft-tissue]
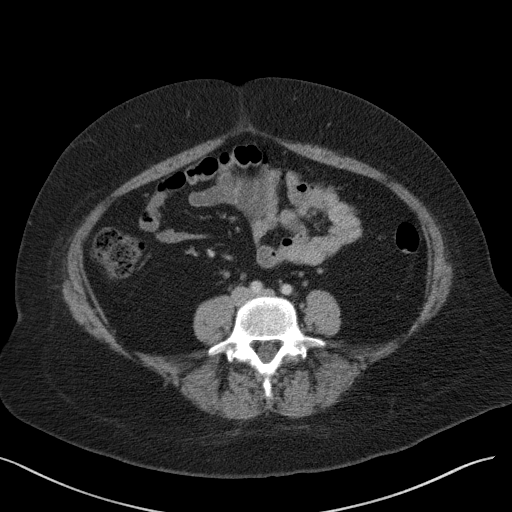
[im 48/91  soft-tissue]
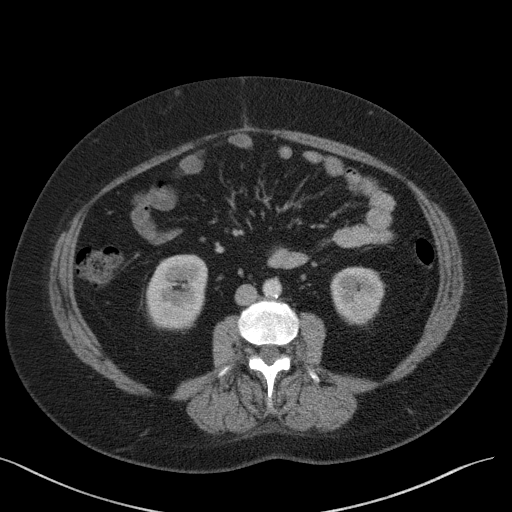
[im 53/91  soft-tissue]
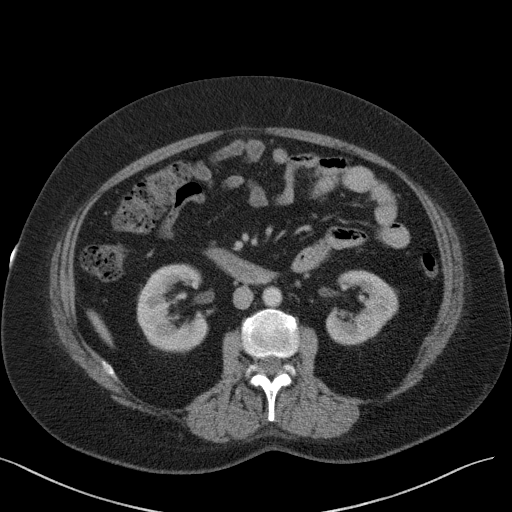
[im 57/91  soft-tissue]
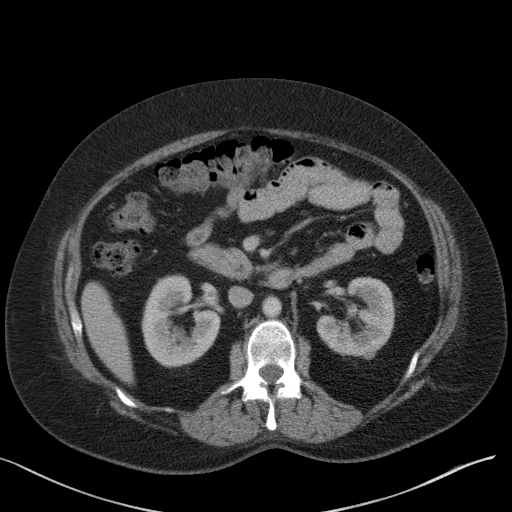
[im 57/91  bone]
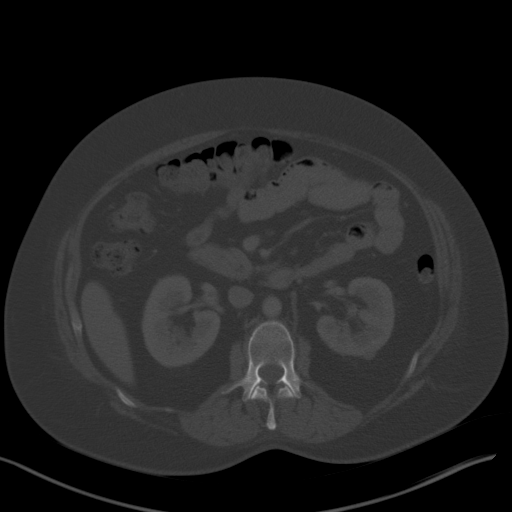
[im 67/91  soft-tissue]
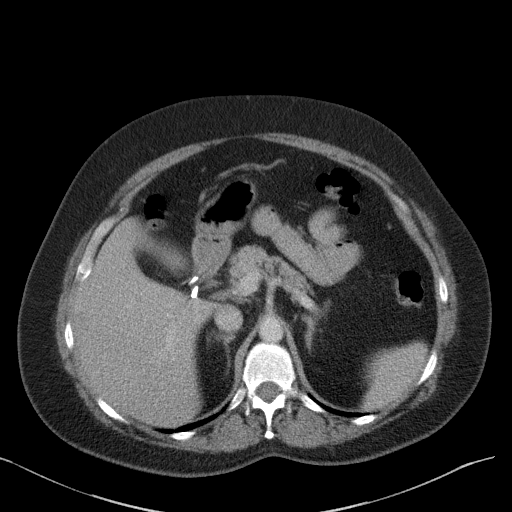
[im 72/91  soft-tissue]
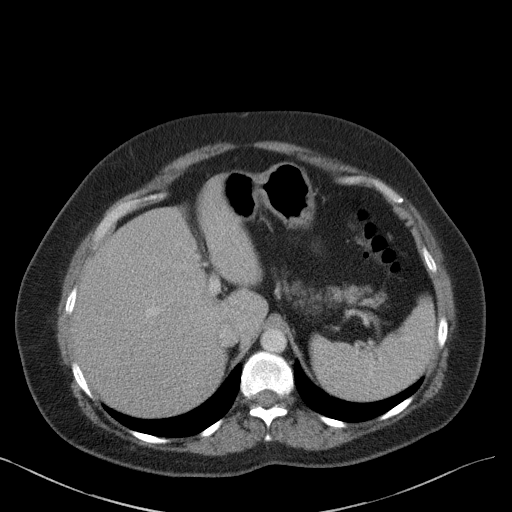
[im 76/91  soft-tissue]
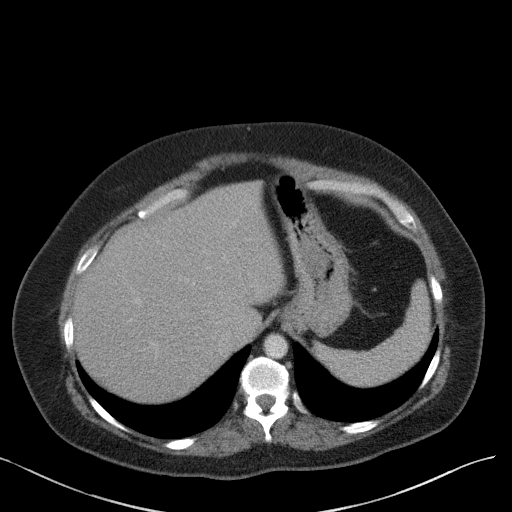
[im 86/91  soft-tissue]
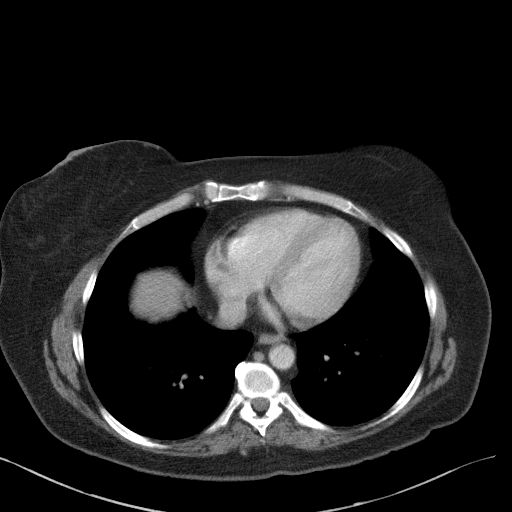

[Series 5: abd/pelvis 3.0 coronal · coronal · 0.77mm/px · 3 of 106 slices shown]
[im 36/106  soft-tissue]
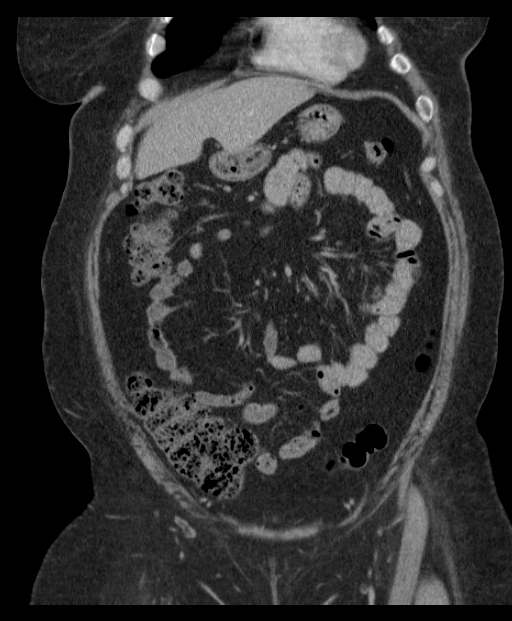
[im 47/106  soft-tissue]
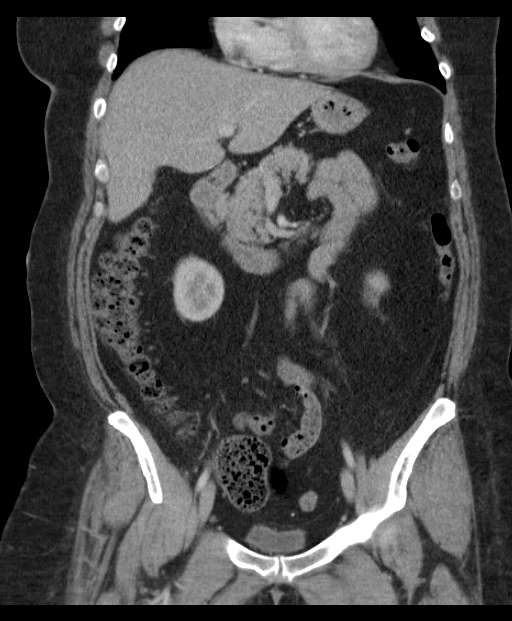
[im 59/106  soft-tissue]
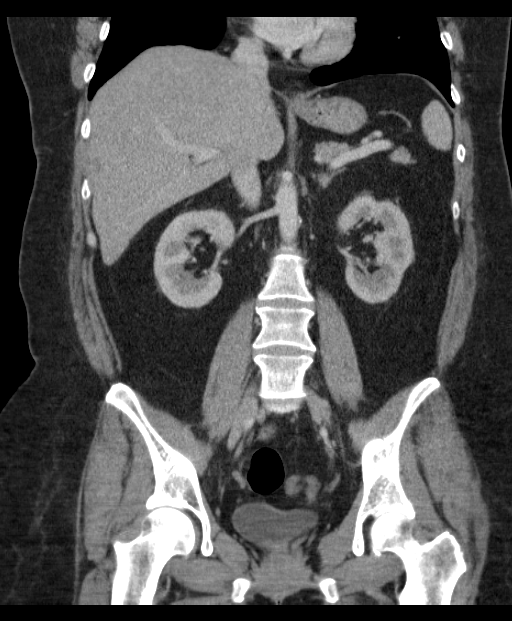

[16 of 46 positions shown; findings below may reference images not displayed]

FINDINGS: Lung bases clear.

Liver, spleen, pancreas, RIGHT kidney and adrenal glands normal.

Tiny nodular density LEFT kidney 9 mm diameter image 35,
indeterminate attenuation.

Appendix surgically absent by history.

Stomach and bowel loops normal appearance.

No mass, adenopathy, free fluid, free air or inflammatory process.

Unremarkable bladder and ureters.

Uterus surgically absent with nonvisualization of ovaries.

No acute osseous findings.

Minimal scattered atherosclerotic calcification.
IMPRESSION: No acute intra-abdominal or intrapelvic abnormalities.

9 mm nonspecific posterior mid LEFT renal lesion, stable in size
since 5262 therefore likely representing a benign process.

## 2015-11-02 NOTE — Progress Notes (Signed)
  Subjective:    CC: back pain with radiation down the right leg  HPI: This is a 50 year old female with known lumbar degenerative disc disease, lately she's had pain in her back with radiation down the right leg to the toes, worse with sitting, flexion, Valsalva. Symptoms are moderate, persistent.  Right knee pain: Localize the medial joint line, swelling, no mechanical symptoms. Pain is moderate, persistent.  Past medical history, Surgical history, Family history not pertinant except as noted below, Social history, Allergies, and medications have been entered into the medical record, reviewed, and no changes needed.   Review of Systems: No fevers, chills, night sweats, weight loss, chest pain, or shortness of breath.   Objective:    General: Well Developed, well nourished, and in no acute distress.  Neuro: Alert and oriented x3, extra-ocular muscles intact, sensation grossly intact.  HEENT: Normocephalic, atraumatic, pupils equal round reactive to light, neck supple, no masses, no lymphadenopathy, thyroid nonpalpable.  Skin: Warm and dry, no rashes. Cardiac: Regular rate and rhythm, no murmurs rubs or gallops, no lower extremity edema.  Respiratory: Clear to auscultation bilaterally. Not using accessory muscles, speaking in full sentences. Right Knee: Visibly swollen with tenderness at the medial joint line ROM normal in flexion and extension and lower leg rotation. Ligaments with solid consistent endpoints including ACL, PCL, LCL, MCL. Negative Mcmurray's and provocative meniscal tests. Non painful patellar compression. Patellar and quadriceps tendons unremarkable. Hamstring and quadriceps strength is normal.  Procedure: Real-time Ultrasound Guided Injection of right knee Device: GE Logiq E  Verbal informed consent obtained.  Time-out conducted.  Noted no overlying erythema, induration, or other signs of local infection.  Skin prepped in a sterile fashion.  Local anesthesia:  Topical Ethyl chloride.  With sterile technique and under real time ultrasound guidance:  0.5 mL Depo-Medrol 40, 2 mL lidocaine, 2 mL Marcaine injected easily. Completed without difficulty  Pain immediately resolved suggesting accurate placement of the medication.  Advised to call if fevers/chills, erythema, induration, drainage, or persistent bleeding.  Images permanently stored and available for review in the ultrasound unit.  Impression: Technically successful ultrasound guided injection.  Impression and Recommendations:

## 2015-11-02 NOTE — Assessment & Plan Note (Signed)
MRI from 2016 shows L5-S1 degenerative disc disease, the disc does come close to the exiting right L5 nerve root. We are going to proceed with aggressive physical therapy for a few weeks, if no improvement she will need to proceed with a lumbar epidural. We will use half dose steroids, and she will have to tolerate any minimal side effects from the steroids.

## 2015-11-02 NOTE — Patient Instructions (Signed)
Bring in BP log and pulse. Only check one a day.  Take all BPs while sitting up in a chair after resting for 5 minutes.    Ideal BP is 120/80  BP is high if over 140/90 Call if BP is over 160/100 Go to ED if BP is over 200/110  BP is low if under 100/55 If low then skip a dose of BP med and increase fluid intake and then recheck BP in 6 hours.

## 2015-11-02 NOTE — Telephone Encounter (Signed)
Pt informed at OV today.Jennifer [name]  

## 2015-11-02 NOTE — Progress Notes (Signed)
   Subjective:    Patient ID: Jennifer [name], female    DOB: 1965-09-15, 50 y.o.   MRN: [id number]  HPI HTN - She was previously on sotalol for blood pressure. It was doing a good job controlling blood pressure and pulse but was causing excessive sweating. She said he sees she does shower she starts sweating and could barely even put her makeup on. She spoke with the cardiologist and they decided to switch to labetalol. The swelling has actually been much better on the labetalol. But she feels like it may have decreased her pressure too much. In fact she took her blood pressure a couple of days ago and it was 106/67 lying down so she stopped the evening dose and just decided to take half tablet in the morning. She's noticed that hasn't been controlling her pulse quite as well. In fact last week she went to emergency department and her pulse was in the 140s.  Hypokalemia - just had potassium checked. It was at goal and she was taking her supplement.    He reports that overall she is feeling a little better. She now has a card for silver sneakers and would like to try going back to the gym. She's very concerned because she's gained another 5 pounds. She's not sure if her bowels are backed up or if it's actual weight gain.  Review of Systems     Objective:   Physical Exam  Constitutional: She is oriented to person, place, and time. She appears well-developed and well-nourished.  HENT:  Head: Normocephalic and atraumatic.  Cardiovascular: Normal rate, regular rhythm and normal heart sounds.   Pulmonary/Chest: Effort normal and breath sounds normal.  Neurological: She is alert and oriented to person, place, and time.  Skin: Skin is warm and dry.  Psychiatric: She has a normal mood and affect. Her behavior is normal.          Assessment & Plan:  HTN - uncontrolled today. Increase labetolol to BID.  Continue half a tab daily.  We discussed that there are going to be times where her blood  pressure is not at goal and when she will still be tachycardic even on a beta blocker. I explained with all blood pressure medications were looking for is if the numbers are in control most of the time. This is where I think it would be extremely helpful if she could just keep a log with blood pressure and pulse checking it only once a day and not over checking it and bringing that back in the next 2 weeks and that we can really have a better objective look at the overall numbers and how well controlled they are. It's not going to be perfect but were just looking for better control.  Tachycardia - I definitely think best suited to a beta blocker. We will continue to monitor. I really want her to write things down instead of just reporting to me when her blood pressure or her pulse goes hives we can really better track at their to trend if it seems to be occurring more in the mornings her more in the evenings. Next  Abnormal weight gain-I really think just getting back into the gym and exercising is going to be the most helpful for getting her weight under control. We recently checked her thyroid it's completely normal.   Hypokalemia - at goal.    Time spent 40 minutes, rated 50% time spent counseling about blood pressure tachycardia and hypokalemia.

## 2015-11-02 NOTE — Assessment & Plan Note (Signed)
Aspiration and injection with half dose Depo-Medrol today. Return in one month.

## 2015-11-04 ENCOUNTER — Telehealth: Payer: Self-pay

## 2015-11-04 ENCOUNTER — Encounter: Payer: Self-pay | Admitting: Family Medicine

## 2015-11-04 ENCOUNTER — Telehealth: Payer: Self-pay | Admitting: Family Medicine

## 2015-11-04 DIAGNOSIS — Z885 Allergy status to narcotic agent status: Secondary | ICD-10-CM | POA: Diagnosis not present

## 2015-11-04 DIAGNOSIS — E269 Hyperaldosteronism, unspecified: Secondary | ICD-10-CM | POA: Diagnosis not present

## 2015-11-04 DIAGNOSIS — I10 Essential (primary) hypertension: Secondary | ICD-10-CM | POA: Diagnosis not present

## 2015-11-04 DIAGNOSIS — Z888 Allergy status to other drugs, medicaments and biological substances status: Secondary | ICD-10-CM | POA: Diagnosis not present

## 2015-11-04 DIAGNOSIS — Z886 Allergy status to analgesic agent status: Secondary | ICD-10-CM | POA: Diagnosis not present

## 2015-11-04 DIAGNOSIS — G8929 Other chronic pain: Secondary | ICD-10-CM | POA: Diagnosis not present

## 2015-11-04 DIAGNOSIS — R450 Nervousness: Secondary | ICD-10-CM | POA: Diagnosis not present

## 2015-11-04 DIAGNOSIS — R51 Headache: Secondary | ICD-10-CM | POA: Diagnosis not present

## 2015-11-04 DIAGNOSIS — R61 Generalized hyperhidrosis: Secondary | ICD-10-CM | POA: Diagnosis not present

## 2015-11-04 DIAGNOSIS — G35 Multiple sclerosis: Secondary | ICD-10-CM | POA: Diagnosis not present

## 2015-11-04 DIAGNOSIS — J45909 Unspecified asthma, uncomplicated: Secondary | ICD-10-CM | POA: Diagnosis not present

## 2015-11-04 DIAGNOSIS — Z87891 Personal history of nicotine dependence: Secondary | ICD-10-CM | POA: Diagnosis not present

## 2015-11-04 DIAGNOSIS — I471 Supraventricular tachycardia: Secondary | ICD-10-CM | POA: Diagnosis not present

## 2015-11-04 NOTE — Telephone Encounter (Signed)
call call patient: Metabolic panel looks great

## 2015-11-04 NOTE — Telephone Encounter (Signed)
Pt.notified

## 2015-11-04 NOTE — Telephone Encounter (Signed)
Pt states she's having more pain above her right kneecap after having her injection on Tuesday. Informed pt to allow 3 days for steroid used to work but informed me that the pain isn't in the back where initial pain was but above kneecap. Please assist.

## 2015-11-04 NOTE — Telephone Encounter (Signed)
Just give it time, nothing is wrong with the knee.

## 2015-11-05 NOTE — Telephone Encounter (Signed)
Attempted calling pt and call could not be completed as dialed. Will send message in [place].

## 2015-11-09 ENCOUNTER — Emergency Department (HOSPITAL_BASED_OUTPATIENT_CLINIC_OR_DEPARTMENT_OTHER)
Admission: EM | Admit: 2015-11-09 | Discharge: 2015-11-09 | Disposition: A | Discharge status: Home / Self Care | Payer: Medicare Other | Attending: Emergency Medicine | Admitting: Emergency Medicine

## 2015-11-09 ENCOUNTER — Encounter (HOSPITAL_BASED_OUTPATIENT_CLINIC_OR_DEPARTMENT_OTHER): Payer: Self-pay | Admitting: *Deleted

## 2015-11-09 DIAGNOSIS — Z87891 Personal history of nicotine dependence: Secondary | ICD-10-CM | POA: Insufficient documentation

## 2015-11-09 DIAGNOSIS — Z9981 Dependence on supplemental oxygen: Secondary | ICD-10-CM | POA: Insufficient documentation

## 2015-11-09 DIAGNOSIS — Z8541 Personal history of malignant neoplasm of cervix uteri: Secondary | ICD-10-CM | POA: Insufficient documentation

## 2015-11-09 DIAGNOSIS — Z8719 Personal history of other diseases of the digestive system: Secondary | ICD-10-CM | POA: Diagnosis not present

## 2015-11-09 DIAGNOSIS — Z8614 Personal history of Methicillin resistant Staphylococcus aureus infection: Secondary | ICD-10-CM | POA: Diagnosis not present

## 2015-11-09 DIAGNOSIS — Z8639 Personal history of other endocrine, nutritional and metabolic disease: Secondary | ICD-10-CM | POA: Insufficient documentation

## 2015-11-09 DIAGNOSIS — G4730 Sleep apnea, unspecified: Secondary | ICD-10-CM | POA: Insufficient documentation

## 2015-11-09 DIAGNOSIS — Z79899 Other long term (current) drug therapy: Secondary | ICD-10-CM | POA: Insufficient documentation

## 2015-11-09 DIAGNOSIS — R079 Chest pain, unspecified: Secondary | ICD-10-CM | POA: Insufficient documentation

## 2015-11-09 DIAGNOSIS — Z8544 Personal history of malignant neoplasm of other female genital organs: Secondary | ICD-10-CM | POA: Diagnosis not present

## 2015-11-09 DIAGNOSIS — Z9851 Tubal ligation status: Secondary | ICD-10-CM | POA: Diagnosis not present

## 2015-11-09 DIAGNOSIS — Z8659 Personal history of other mental and behavioral disorders: Secondary | ICD-10-CM | POA: Diagnosis not present

## 2015-11-09 DIAGNOSIS — R109 Unspecified abdominal pain: Secondary | ICD-10-CM | POA: Diagnosis not present

## 2015-11-09 DIAGNOSIS — J45909 Unspecified asthma, uncomplicated: Secondary | ICD-10-CM | POA: Insufficient documentation

## 2015-11-09 DIAGNOSIS — Z862 Personal history of diseases of the blood and blood-forming organs and certain disorders involving the immune mechanism: Secondary | ICD-10-CM | POA: Insufficient documentation

## 2015-11-09 DIAGNOSIS — E876 Hypokalemia: Secondary | ICD-10-CM | POA: Diagnosis not present

## 2015-11-09 DIAGNOSIS — Z8739 Personal history of other diseases of the musculoskeletal system and connective tissue: Secondary | ICD-10-CM | POA: Insufficient documentation

## 2015-11-09 DIAGNOSIS — Z9889 Other specified postprocedural states: Secondary | ICD-10-CM | POA: Diagnosis not present

## 2015-11-09 DIAGNOSIS — Z7951 Long term (current) use of inhaled steroids: Secondary | ICD-10-CM | POA: Diagnosis not present

## 2015-11-09 DIAGNOSIS — Z880 Allergy status to penicillin: Secondary | ICD-10-CM | POA: Diagnosis not present

## 2015-11-09 DIAGNOSIS — R5383 Other fatigue: Secondary | ICD-10-CM | POA: Diagnosis not present

## 2015-11-09 DIAGNOSIS — R110 Nausea: Secondary | ICD-10-CM

## 2015-11-09 DIAGNOSIS — I10 Essential (primary) hypertension: Secondary | ICD-10-CM | POA: Insufficient documentation

## 2015-11-09 LAB — BASIC METABOLIC PANEL
BUN: 14 mg/dL (ref 4–21)
Creatinine: 0.6 mg/dL (ref 0.5–1.1)
Glucose: 118 mg/dL
Potassium: 4 mmol/L (ref 3.4–5.3)
Sodium: 140 mmol/L (ref 137–147)

## 2015-11-09 LAB — CBC AND DIFFERENTIAL
HCT: 41 % (ref 36–46)
Hemoglobin: 13.6 g/dL (ref 12.0–16.0)
WBC: 6.5 10^3/mL

## 2015-11-09 MED ORDER — ONDANSETRON 4 MG PO TBDP
4.0000 mg | ORAL_TABLET | Freq: Once | ORAL | Status: AC
  Administered 2015-11-09: 4 mg via ORAL
  Filled 2015-11-09: qty 1
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
## 2015-11-09 NOTE — ED Notes (Signed)
Delay explained to pt. Pt alert and ambulating in lobby. NAD

## 2015-11-09 NOTE — ED Notes (Signed)
Nausea, abdominal pain and hypertension when they took her BP at the gym today.

## 2015-11-10 ENCOUNTER — Encounter: Payer: Self-pay | Admitting: Family Medicine

## 2015-11-11 ENCOUNTER — Telehealth: Payer: Self-pay | Admitting: *Deleted

## 2015-11-11 ENCOUNTER — Ambulatory Visit: Payer: Medicare Other | Attending: Gynecologic Oncology | Admitting: Gynecologic Oncology

## 2015-11-11 ENCOUNTER — Encounter: Payer: Self-pay | Admitting: Gynecologic Oncology

## 2015-11-11 ENCOUNTER — Other Ambulatory Visit (HOSPITAL_COMMUNITY)
Admission: RE | Admit: 2015-11-11 | Discharge: 2015-11-11 | Disposition: A | Discharge status: Home / Self Care | Payer: Medicare Other | Source: Ambulatory Visit | Attending: Gynecologic Oncology | Admitting: Gynecologic Oncology

## 2015-11-11 VITALS — BP 151/81 | HR 92 | Temp 99.3°F | Wt 204.5 lb

## 2015-11-11 DIAGNOSIS — Z87891 Personal history of nicotine dependence: Secondary | ICD-10-CM | POA: Insufficient documentation

## 2015-11-11 DIAGNOSIS — E785 Hyperlipidemia, unspecified: Secondary | ICD-10-CM | POA: Diagnosis not present

## 2015-11-11 DIAGNOSIS — G8929 Other chronic pain: Secondary | ICD-10-CM | POA: Insufficient documentation

## 2015-11-11 DIAGNOSIS — R102 Pelvic and perineal pain: Secondary | ICD-10-CM | POA: Diagnosis not present

## 2015-11-11 DIAGNOSIS — C541 Malignant neoplasm of endometrium: Secondary | ICD-10-CM

## 2015-11-11 DIAGNOSIS — I471 Supraventricular tachycardia: Secondary | ICD-10-CM | POA: Diagnosis not present

## 2015-11-11 DIAGNOSIS — K219 Gastro-esophageal reflux disease without esophagitis: Secondary | ICD-10-CM | POA: Insufficient documentation

## 2015-11-11 DIAGNOSIS — R51 Headache: Secondary | ICD-10-CM | POA: Diagnosis not present

## 2015-11-11 DIAGNOSIS — R1031 Right lower quadrant pain: Secondary | ICD-10-CM

## 2015-11-11 DIAGNOSIS — J45909 Unspecified asthma, uncomplicated: Secondary | ICD-10-CM | POA: Insufficient documentation

## 2015-11-11 DIAGNOSIS — C519 Malignant neoplasm of vulva, unspecified: Secondary | ICD-10-CM

## 2015-11-11 DIAGNOSIS — N904 Leukoplakia of vulva: Secondary | ICD-10-CM | POA: Diagnosis not present

## 2015-11-11 DIAGNOSIS — Z8544 Personal history of malignant neoplasm of other female genital organs: Secondary | ICD-10-CM

## 2015-11-11 DIAGNOSIS — G4730 Sleep apnea, unspecified: Secondary | ICD-10-CM | POA: Insufficient documentation

## 2015-11-11 DIAGNOSIS — C4499 Other specified malignant neoplasm of skin, unspecified: Secondary | ICD-10-CM | POA: Insufficient documentation

## 2015-11-11 DIAGNOSIS — M25511 Pain in right shoulder: Secondary | ICD-10-CM | POA: Diagnosis not present

## 2015-11-11 DIAGNOSIS — F40240 Claustrophobia: Secondary | ICD-10-CM | POA: Diagnosis not present

## 2015-11-11 DIAGNOSIS — Z8542 Personal history of malignant neoplasm of other parts of uterus: Secondary | ICD-10-CM

## 2015-11-11 DIAGNOSIS — Z01411 Encounter for gynecological examination (general) (routine) with abnormal findings: Secondary | ICD-10-CM | POA: Diagnosis not present

## 2015-11-11 DIAGNOSIS — M542 Cervicalgia: Secondary | ICD-10-CM | POA: Diagnosis not present

## 2015-11-11 DIAGNOSIS — I10 Essential (primary) hypertension: Secondary | ICD-10-CM | POA: Insufficient documentation

## 2015-11-11 DIAGNOSIS — Z87828 Personal history of other (healed) physical injury and trauma: Secondary | ICD-10-CM | POA: Diagnosis not present

## 2015-11-11 DIAGNOSIS — G35 Multiple sclerosis: Secondary | ICD-10-CM | POA: Insufficient documentation

## 2015-11-11 DIAGNOSIS — F4310 Post-traumatic stress disorder, unspecified: Secondary | ICD-10-CM | POA: Insufficient documentation

## 2015-11-11 DIAGNOSIS — M545 Low back pain: Secondary | ICD-10-CM | POA: Insufficient documentation
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
## 2015-11-11 NOTE — Patient Instructions (Signed)
We will call you with the results of your pap smear and vulvar biopsy from today.  I will also reach out to Polo Riley, Education officer, museum, for her to give you a call or arrange an appt.  Please call our office for any questions or concerns.

## 2015-11-11 NOTE — Telephone Encounter (Signed)
Call received by this RN from pt stating " I hope they sent me to the right person who can help me "  Pt states she was previously seen by Dr Rogelio Seen " and had a full hysterectomy ".  " I missed my follow up appointment because I was sick but now am having pain and am scared "  Pt became emotional per stating above.  This RN validated pt's concern but also need for additional information.  Pain is across the pubic area- with increased pain noted in left side "like where an ovary would be "   Pt states she feels bloated and area is tender to touch.  She denies any bowel concerns with verbalized history of chronic constipation " but I have never had any pain like this with my constipation"  Per chart review- noted pt cancelled appointment with Dr Charna Elizabeth in Feb of this year.  This RN informed pt her concerns would be taken to the ONC/GYN office for appropriate follow up.  Best number to contact patient given per this call is [phone].

## 2015-11-14 DIAGNOSIS — N939 Abnormal uterine and vaginal bleeding, unspecified: Secondary | ICD-10-CM | POA: Diagnosis not present

## 2015-11-14 DIAGNOSIS — Z9861 Coronary angioplasty status: Secondary | ICD-10-CM | POA: Diagnosis not present

## 2015-11-14 DIAGNOSIS — J45909 Unspecified asthma, uncomplicated: Secondary | ICD-10-CM | POA: Diagnosis not present

## 2015-11-14 DIAGNOSIS — Z79899 Other long term (current) drug therapy: Secondary | ICD-10-CM | POA: Diagnosis not present

## 2015-11-14 DIAGNOSIS — I10 Essential (primary) hypertension: Secondary | ICD-10-CM | POA: Diagnosis not present

## 2015-11-14 DIAGNOSIS — N9089 Other specified noninflammatory disorders of vulva and perineum: Secondary | ICD-10-CM | POA: Diagnosis not present

## 2015-11-14 DIAGNOSIS — G35 Multiple sclerosis: Secondary | ICD-10-CM | POA: Diagnosis not present

## 2015-11-14 DIAGNOSIS — Z8542 Personal history of malignant neoplasm of other parts of uterus: Secondary | ICD-10-CM | POA: Diagnosis not present

## 2015-11-14 DIAGNOSIS — G4733 Obstructive sleep apnea (adult) (pediatric): Secondary | ICD-10-CM | POA: Diagnosis not present

## 2015-11-15 ENCOUNTER — Encounter: Payer: Self-pay | Admitting: *Deleted

## 2015-11-15 ENCOUNTER — Emergency Department (HOSPITAL_BASED_OUTPATIENT_CLINIC_OR_DEPARTMENT_OTHER)
Admission: EM | Admit: 2015-11-15 | Discharge: 2015-11-15 | Disposition: A | Discharge status: Home / Self Care | Payer: Medicare Other | Attending: Emergency Medicine | Admitting: Emergency Medicine

## 2015-11-15 ENCOUNTER — Encounter (HOSPITAL_BASED_OUTPATIENT_CLINIC_OR_DEPARTMENT_OTHER): Payer: Self-pay | Admitting: *Deleted

## 2015-11-15 ENCOUNTER — Telehealth: Payer: Self-pay

## 2015-11-15 DIAGNOSIS — G4730 Sleep apnea, unspecified: Secondary | ICD-10-CM | POA: Insufficient documentation

## 2015-11-15 DIAGNOSIS — F419 Anxiety disorder, unspecified: Secondary | ICD-10-CM | POA: Insufficient documentation

## 2015-11-15 DIAGNOSIS — J45909 Unspecified asthma, uncomplicated: Secondary | ICD-10-CM | POA: Diagnosis not present

## 2015-11-15 DIAGNOSIS — IMO0001 Reserved for inherently not codable concepts without codable children: Secondary | ICD-10-CM

## 2015-11-15 DIAGNOSIS — G8929 Other chronic pain: Secondary | ICD-10-CM | POA: Diagnosis not present

## 2015-11-15 DIAGNOSIS — Z8639 Personal history of other endocrine, nutritional and metabolic disease: Secondary | ICD-10-CM | POA: Diagnosis not present

## 2015-11-15 DIAGNOSIS — I10 Essential (primary) hypertension: Secondary | ICD-10-CM | POA: Diagnosis not present

## 2015-11-15 DIAGNOSIS — Z79899 Other long term (current) drug therapy: Secondary | ICD-10-CM | POA: Insufficient documentation

## 2015-11-15 DIAGNOSIS — Z8544 Personal history of malignant neoplasm of other female genital organs: Secondary | ICD-10-CM | POA: Insufficient documentation

## 2015-11-15 DIAGNOSIS — Z87891 Personal history of nicotine dependence: Secondary | ICD-10-CM | POA: Diagnosis not present

## 2015-11-15 DIAGNOSIS — Z8614 Personal history of Methicillin resistant Staphylococcus aureus infection: Secondary | ICD-10-CM | POA: Insufficient documentation

## 2015-11-15 DIAGNOSIS — R51 Headache: Secondary | ICD-10-CM | POA: Insufficient documentation

## 2015-11-15 DIAGNOSIS — R030 Elevated blood-pressure reading, without diagnosis of hypertension: Secondary | ICD-10-CM

## 2015-11-15 DIAGNOSIS — Z880 Allergy status to penicillin: Secondary | ICD-10-CM | POA: Diagnosis not present

## 2015-11-15 DIAGNOSIS — Z9981 Dependence on supplemental oxygen: Secondary | ICD-10-CM | POA: Insufficient documentation

## 2015-11-15 DIAGNOSIS — Z9889 Other specified postprocedural states: Secondary | ICD-10-CM | POA: Diagnosis not present

## 2015-11-15 DIAGNOSIS — Z862 Personal history of diseases of the blood and blood-forming organs and certain disorders involving the immune mechanism: Secondary | ICD-10-CM | POA: Diagnosis not present

## 2015-11-15 DIAGNOSIS — R110 Nausea: Secondary | ICD-10-CM | POA: Diagnosis not present

## 2015-11-15 DIAGNOSIS — Z8541 Personal history of malignant neoplasm of cervix uteri: Secondary | ICD-10-CM | POA: Diagnosis not present

## 2015-11-15 DIAGNOSIS — Z8719 Personal history of other diseases of the digestive system: Secondary | ICD-10-CM | POA: Diagnosis not present

## 2015-11-15 DIAGNOSIS — R519 Headache, unspecified: Secondary | ICD-10-CM

## 2015-11-15 LAB — BASIC METABOLIC PANEL
Anion gap: 9 (ref 5–15)
BUN: 9 mg/dL (ref 6–20)
CO2: 24 mmol/L (ref 22–32)
Calcium: 9.1 mg/dL (ref 8.9–10.3)
Chloride: 108 mmol/L (ref 101–111)
Creatinine, Ser: 0.51 mg/dL (ref 0.44–1.00)
GFR calc Af Amer: >60 mL/min (ref >60)
GFR calc non Af Amer: >60 mL/min (ref >60)
Glucose, Bld: 119 mg/dL — ABNORMAL HIGH (ref 65–99)
Potassium: 3.6 mmol/L (ref 3.5–5.1)
Sodium: 141 mmol/L (ref 135–145)

## 2015-11-15 LAB — CBC WITH DIFFERENTIAL/PLATELET
Basophils Absolute: 0 10*3/uL (ref 0.0–0.1)
Basophils Relative: 1 %
Eosinophils Absolute: 0.2 10*3/uL (ref 0.0–0.7)
Eosinophils Relative: 3 %
HCT: 41.7 % (ref 36.0–46.0)
Hemoglobin: 13.7 g/dL (ref 12.0–15.0)
Lymphocytes Relative: 24 %
Lymphs Abs: 1.3 10*3/uL (ref 0.7–4.0)
MCH: 30.6 pg (ref 26.0–34.0)
MCHC: 32.9 g/dL (ref 30.0–36.0)
MCV: 93.3 fL (ref 78.0–100.0)
Monocytes Absolute: 0.5 10*3/uL (ref 0.1–1.0)
Monocytes Relative: 10 %
Neutro Abs: 3.5 10*3/uL (ref 1.7–7.7)
Neutrophils Relative %: 62 %
Platelets: 199 10*3/uL (ref 150–400)
RBC: 4.47 MIL/uL (ref 3.87–5.11)
RDW: 12.8 % (ref 11.5–15.5)
WBC: 5.5 10*3/uL (ref 4.0–10.5)

## 2015-11-15 LAB — CYTOLOGY - PAP

## 2015-11-15 MED ORDER — ONDANSETRON 4 MG PO TBDP
4.0000 mg | ORAL_TABLET | Freq: Once | ORAL | Status: AC
  Administered 2015-11-15: 4 mg via ORAL
  Filled 2015-11-15: qty 1
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
## 2015-11-15 NOTE — ED Notes (Addendum)
Pt sts she awoke at apprx. 5:30 am today "feeling horrible". She has many vague complaints with some discomfort under her left breast, nausea, and her head feels foggy. She sts her BP was high (diastolic [id number]) this am so she took her BP med early without relief. She sts she has been taking labetalol for apprx 1 month. She is supposed to see her PCP tomorrow. Pt describes left chest discomfort as a pinching sensation that started after bending over a few days ago.

## 2015-11-15 NOTE — Progress Notes (Signed)
[facility] Work  Clinical Social Work was referred by [facility] oncology for survivorship support.  Per Joylene John, NP, patient requesting support to cope with previous cancer diagnosis.  Clinical Social Worker attempted to contact patient, phone did not have mailbox, CSW unable to leave message. CSW will attempt to contact patient again.    Polo Riley, MSW, LCSW, OSW-C Clinical Social Worker [facility] [phone]

## 2015-11-15 NOTE — ED Notes (Signed)
PA at bedside.

## 2015-11-15 NOTE — Telephone Encounter (Signed)
Orders received from [facility] to contact the patient with "normal" PAP results collected on 11/11/2015 during MD visit with Dr Janie Morning , biopsy results pending. Attempted to  Contacted the patient to updated with PAP results being "normal" and that her biopsy results are pending. No answer , left a detailed message with call back information given if additional questions arise.

## 2015-11-16 ENCOUNTER — Ambulatory Visit (INDEPENDENT_AMBULATORY_CARE_PROVIDER_SITE_OTHER): Payer: Medicare Other | Admitting: Family Medicine

## 2015-11-16 ENCOUNTER — Encounter: Payer: Self-pay | Admitting: Family Medicine

## 2015-11-16 VITALS — BP 129/68 | HR 90 | Wt 204.0 lb

## 2015-11-16 DIAGNOSIS — I10 Essential (primary) hypertension: Secondary | ICD-10-CM | POA: Diagnosis not present

## 2015-11-16 DIAGNOSIS — R110 Nausea: Secondary | ICD-10-CM | POA: Diagnosis not present

## 2015-11-16 DIAGNOSIS — R252 Cramp and spasm: Secondary | ICD-10-CM

## 2015-11-16 DIAGNOSIS — R42 Dizziness and giddiness: Secondary | ICD-10-CM

## 2015-11-16 MED ORDER — ONDANSETRON 4 MG PO TBDP: 4.0000 mg | ORAL_TABLET | Freq: Three times a day (TID) | ORAL | Status: DC | PRN
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
## 2015-11-17 ENCOUNTER — Encounter: Payer: Self-pay | Admitting: Osteopathic Medicine

## 2015-11-17 ENCOUNTER — Encounter: Payer: Self-pay | Admitting: Family Medicine

## 2015-11-17 ENCOUNTER — Ambulatory Visit (INDEPENDENT_AMBULATORY_CARE_PROVIDER_SITE_OTHER): Payer: Medicare Other | Admitting: Osteopathic Medicine

## 2015-11-17 ENCOUNTER — Telehealth: Payer: Self-pay | Admitting: Gynecologic Oncology

## 2015-11-17 ENCOUNTER — Encounter: Payer: Self-pay | Admitting: *Deleted

## 2015-11-17 VITALS — BP 147/83 | HR 106 | Temp 99.2°F | Wt 206.0 lb

## 2015-11-17 DIAGNOSIS — B349 Viral infection, unspecified: Secondary | ICD-10-CM | POA: Diagnosis not present

## 2015-11-17 DIAGNOSIS — R0989 Other specified symptoms and signs involving the circulatory and respiratory systems: Secondary | ICD-10-CM

## 2015-11-17 DIAGNOSIS — F458 Other somatoform disorders: Secondary | ICD-10-CM | POA: Diagnosis not present

## 2015-11-17 DIAGNOSIS — R198 Other specified symptoms and signs involving the digestive system and abdomen: Secondary | ICD-10-CM

## 2015-11-17 DIAGNOSIS — J029 Acute pharyngitis, unspecified: Secondary | ICD-10-CM

## 2015-11-17 LAB — POCT RAPID STREP A (OFFICE): Rapid Strep A Screen: NEGATIVE

## 2015-11-17 LAB — MAGNESIUM: Blood: 2.1

## 2015-11-17 MED ORDER — DEXAMETHASONE SODIUM PHOSPHATE 4 MG/ML IJ SOLN
2.0000 mg | Freq: Once | INTRAMUSCULAR | Status: AC
  Administered 2015-11-17: 2 mg via INTRAMUSCULAR

## 2015-11-17 NOTE — Progress Notes (Signed)
HPI: Jennifer [name] is a 50 y.o. female who presents to [facility] today for chief complaint of:  Chief Complaint  Patient presents with  . Sore Throat     . Location: throat . Quality: sore, cough, feels a bit sore and tight/swollen . Severity: worse since yesterday  . Duration: started yesterday . Timing: constant, worse with eating and with cough . Context: no sick contacts. Been on ENT for scope in the past.  . Modifying factors: took some Tylenol  . Assoc signs/symptoms: see ROS   Past medical, social and family history reviewed. Medications and allergies reviewed.    Review of Systems: CONSTITUTIONAL:  No  fever, no chills, No  unintentional weight changes HEAD/EYES/EARS/NOSE/THROAT: No  headache, no vision change, no hearing change, (+) sore throat, No  sinus pressure, (+) globus sensation CARDIAC: No  chest pain, No  pressure, No palpitations, No  orthopnea RESPIRATORY: (+) cough, No  shortness of breath/wheeze GASTROINTESTINAL: (+) nausea, No  vomiting, No  abdominal pain, SKIN: No  rash/wounds/concerning lesions  Exam:  BP 147/83 mmHg  Pulse 106  Temp(Src) 99.2 F (37.3 C) (Oral)  Wt 206 lb (93.441 kg)  LMP 06/25/2013 Constitutional: VS see above. General Appearance: alert, well-developed, well-nourished, NAD Eyes: Normal lids and conjunctive, non-icteric sclera,  Ears, Nose, Mouth, Throat: MMM, Normal external inspection ears/nares/mouth/lips/gums,  Pharynx no erythema, no exudate. (+) canker sore on L tongue.  Neck: No masses, trachea midline. No thyroid enlargement/tenderness/mass appreciated. No lymphadenopathy though pt is tender anterior LN Respiratory: Normal respiratory effort. no wheeze, no rhonchi, no rales Cardiovascular: S1/S2 normal, no murmur, no rub/gallop auscultated. RRR.  Gastrointestinal: Nontender, no masses.   Psychiatric: Fair judgment/insight. Normal mood and affect. Oriented x3.    Results for orders  placed or performed in visit on 11/17/15 (from the past 72 hour(s))  POCT rapid strep A     Status: None   Collection Time: 11/17/15  2:12 PM  Result Value Ref Range   Rapid Strep A Screen Negative Negative   *Note: Due to a large number of results and/or encounters for the requested time period, some results have not been displayed. A complete set of results can be found in Results Review.      ASSESSMENT/PLAN:   Sore throat - Plan: POCT rapid strep A, dexamethasone (DECADRON) injection 2 mg  Globus sensation - Plan: dexamethasone (DECADRON) injection 2 mg  Pharyngitis with viral syndrome - Plan: dexamethasone (DECADRON) injection 2 mg  Return if symptoms worsen or fail to improve.

## 2015-11-17 NOTE — Patient Instructions (Signed)
Your symptoms are most consistent with the onset of a viral upper respiratory infection such as a cold or a pharyngitis. Sometimes the dose of steroids can help alleviate some of the inflammation, you can also take Tylenol for pain/inflammation, use honey or cough lozenges, herbal teas. The other option is something caught a globus sensation, this is when patients typically will feel like something is sticking in the throat even though nothing necessarily is, usually we need to refer to ear nose and throat for that they can do a scope in the office for follow up with imaging studies if needed. Please let us know if you're not doing any better/if you get worse.

## 2015-11-17 NOTE — Telephone Encounter (Signed)
Attempted to call patient with results of pap smear and vulvar biopsy.  Unable to leave message.  Will retry later today.

## 2015-11-17 NOTE — Progress Notes (Signed)
CSW SECOND ATTEMPT TO REACH PATIENT:  Clinical Social Work was referred by GYN oncology for survivorship support. Per Joylene John, NP, patient requesting support to cope with previous cancer diagnosis. Clinical Social Worker attempted to contact patient, phone did not have mailbox, CSW unable to leave message.  Polo Riley, MSW, LCSW, OSW-C Clinical Social Worker [facility] [phone]

## 2015-11-18 ENCOUNTER — Telehealth: Payer: Self-pay | Admitting: Gynecologic Oncology

## 2015-11-18 NOTE — Telephone Encounter (Signed)
Attempted to call patient with results of pap smear and vulvar biopsy.  Unavailable and unable to leave a message.  Will retry at a later time.

## 2015-11-22 ENCOUNTER — Telehealth: Payer: Self-pay | Admitting: Gynecologic Oncology

## 2015-11-22 ENCOUNTER — Encounter: Payer: Self-pay | Admitting: Family Medicine

## 2015-11-22 ENCOUNTER — Ambulatory Visit (INDEPENDENT_AMBULATORY_CARE_PROVIDER_SITE_OTHER): Payer: Medicare Other | Admitting: Family Medicine

## 2015-11-22 VITALS — BP 143/72 | HR 96 | Wt 207.0 lb

## 2015-11-22 DIAGNOSIS — R110 Nausea: Secondary | ICD-10-CM | POA: Diagnosis not present

## 2015-11-22 DIAGNOSIS — R079 Chest pain, unspecified: Secondary | ICD-10-CM | POA: Diagnosis not present

## 2015-11-22 DIAGNOSIS — R1011 Right upper quadrant pain: Secondary | ICD-10-CM

## 2015-11-22 DIAGNOSIS — R5383 Other fatigue: Secondary | ICD-10-CM | POA: Diagnosis not present

## 2015-11-22 DIAGNOSIS — G35 Multiple sclerosis: Secondary | ICD-10-CM | POA: Diagnosis not present

## 2015-11-22 DIAGNOSIS — I10 Essential (primary) hypertension: Secondary | ICD-10-CM

## 2015-11-22 DIAGNOSIS — D649 Anemia, unspecified: Secondary | ICD-10-CM | POA: Diagnosis not present

## 2015-11-22 DIAGNOSIS — R8290 Unspecified abnormal findings in urine: Secondary | ICD-10-CM

## 2015-11-22 DIAGNOSIS — R0602 Shortness of breath: Secondary | ICD-10-CM | POA: Diagnosis not present

## 2015-11-22 DIAGNOSIS — Z87891 Personal history of nicotine dependence: Secondary | ICD-10-CM | POA: Diagnosis not present

## 2015-11-22 DIAGNOSIS — M79601 Pain in right arm: Secondary | ICD-10-CM | POA: Diagnosis not present

## 2015-11-22 DIAGNOSIS — G894 Chronic pain syndrome: Secondary | ICD-10-CM | POA: Diagnosis not present

## 2015-11-22 MED ORDER — CLINDAMYCIN HCL 150 MG PO CAPS: 150.0000 mg | ORAL_CAPSULE | Freq: Three times a day (TID) | ORAL | Status: DC

## 2015-11-22 MED ORDER — DIAZEPAM 2 MG PO TABS: 1.0000 mg | ORAL_TABLET | Freq: Four times a day (QID) | ORAL | Status: DC | PRN

## 2015-11-22 NOTE — Telephone Encounter (Signed)
"  I don't agree with that diagnosis."  Patient called stating she saw her results on Mychart.  Advised that our office had attempted to reach out to her and were unable to leave voicemail messages.  Discussed pap smear results and vulvar biopsy.  Patient stating she does not have "alot of itching" and she does not agree with the diagnosis of lichen sclerosis.  Wanting another review from pathology.  Advised that I would contact the path department and request another pathologist to review the biopsy and would notify her when results available.

## 2015-11-22 NOTE — Patient Instructions (Signed)
Start gabapentin 100mg  at bedtime. After 5 days can increase to 2 tabs at bedtime.

## 2015-11-22 NOTE — Progress Notes (Signed)
Subjective:    Patient ID: Jennifer [name], female    DOB: [birth year], 50 y.o.   MRN: [id number]  HPI She is here today for follow-up for hypertension. When I saw her last time we decided to increase her labetalol to 3 times a day. It seemed to be controlling her blood pressures in the afternoons but in the morning before her first dose it seemed to be running a lot higher. She has been taking it 3 times a day but also feels like she's noticed some increased shortness of breath over the last 3-4 days. She's not sure if it's related to the labetalol but thinks that it could be. She's also been experiencing some significant nasal congestion with a lot of pressure over the left maxillary sinus. She's also been getting some blood out when she blows. She's been doing her saline rinses daily. She just feels like she's had a harder time catching her breath. In fact she tried to the gym on Thursday and had a hard time exercising and keeping her balance that day.   He also complains of an odor in the groin. She said she's been checked for bacterial vaginitis etc. and she feels like it's coming more from the urethra area anyway. We did do a urine culture about 6 weeks ago but was contaminated. Dysuria or hematuria.  She has not started the gabapentin yet. She is a little bit nervous about starting it wanted to make sure it wasn't going to worsen her shortness of breath.  He also complains of intermittent severe right upper quadrant pain. Has happened several times and usually just last a few seconds but when it does happen it's extremely intense and painful. She denies any nausea or vomiting with it. It seems to be somewhat positional triggered. She notices happen more with bending over. It actually happened here she was trying to get her urine sample for [place] today. History cholecystectomy.     Review of Systems     Objective:   Physical Exam  Constitutional: She is oriented to person, place, and time.  She appears well-developed and well-nourished.  HENT:  Head: Normocephalic and atraumatic.  Right Ear: External ear normal.  Left Ear: External ear normal.  Nose: Nose normal.  Mouth/Throat: Oropharynx is clear and moist.  TMs and canals are clear. She does have some mild swelling of the left maxillary sinus. No erythema. Thurman's appear erythematous and inflamed.  Eyes: Conjunctivae and EOM are normal. Pupils are equal, round, and reactive to light.  Neck: Neck supple. No thyromegaly present.  Cardiovascular: Normal rate, regular rhythm and normal heart sounds.   Pulmonary/Chest: Effort normal and breath sounds normal. She has no wheezes.  Abdominal: Soft. Bowel sounds are normal. She exhibits no distension and no mass. There is tenderness. There is no rebound and no guarding.  Mild tenderness in the right upper quadrant. No palpable masses or lesions.  Lymphadenopathy:    She has no cervical adenopathy.  Neurological: She is alert and oriented to person, place, and time.  Skin: Skin is warm and dry.  Psychiatric: She has a normal mood and affect.        Assessment & Plan:  Acute left maxillary sinusitis-we'll treat with clindamycin as is warm to few in about etc. she actually tolerates. Will treat with 150 mg 3 times a day for 10 days. I'll if not improving or better. Continue with regular allergy medications as well as daily nasal sinus rinses. Next  Urine odor-will send another urine culture. The one that we send about 5 weeks ago was contaminated certainly reflecting today.  Shortness of breath-explained that it may or may not be related to the beta blocker. In fact beta blockers rarely actually call shortness of breath. For now she would prefer to decrease her dose back down to twice a day and see if she notices a difference. We may consider adding back amlodipine at a low dose to the labetalol for better blood pressure control. Next  Did encourage her to go ahead and start her  gabapentin. We will hold off on adding the amlodipine intestinal the gabapentin for at least a week to make sure that she is tolerating it well.  RUQ pain - spleen is consistent with abdominal wall spasm versus diaphragm spasms-we did discuss that she needs to make sure that she's not eating foods are causing a lot of excess pressure or gas in the stomach which can then pressed on the diaphragm. Her symptoms that really seem to be triggered when she bends over.

## 2015-11-22 NOTE — Telephone Encounter (Signed)
Spoke with Maudie Mercury in Pathology.  Request placed for second opinion with Dr. Lyndon Code for vulvar biopsy due to patient's concerns.

## 2015-11-23 ENCOUNTER — Telehealth: Payer: Self-pay | Admitting: Gynecologic Oncology

## 2015-11-23 ENCOUNTER — Ambulatory Visit: Payer: Medicare Other | Admitting: Pediatrics

## 2015-11-23 LAB — URINE CULTURE
Colony Count: NO GROWTH
Organism ID, Bacteria: NO GROWTH

## 2015-11-23 NOTE — Telephone Encounter (Signed)
Attempted to call patient to let her know the outcome of her second opinion pathology read from her vulvar biopsy.  Per Dr. Lyndon Code, the biopsy looks more like inflammation of the skin.  Not Paget's of the vulva.

## 2015-11-24 ENCOUNTER — Encounter: Payer: Self-pay | Admitting: *Deleted

## 2015-11-24 IMAGING — CR DG CHEST 2V
2 series · 2 of 2 positions shown · non-contrast
Comparison: August 17, 2014

CLINICAL DATA: One day history of wheezing

EXAM:
CHEST  2 VIEW

[w chest pa]
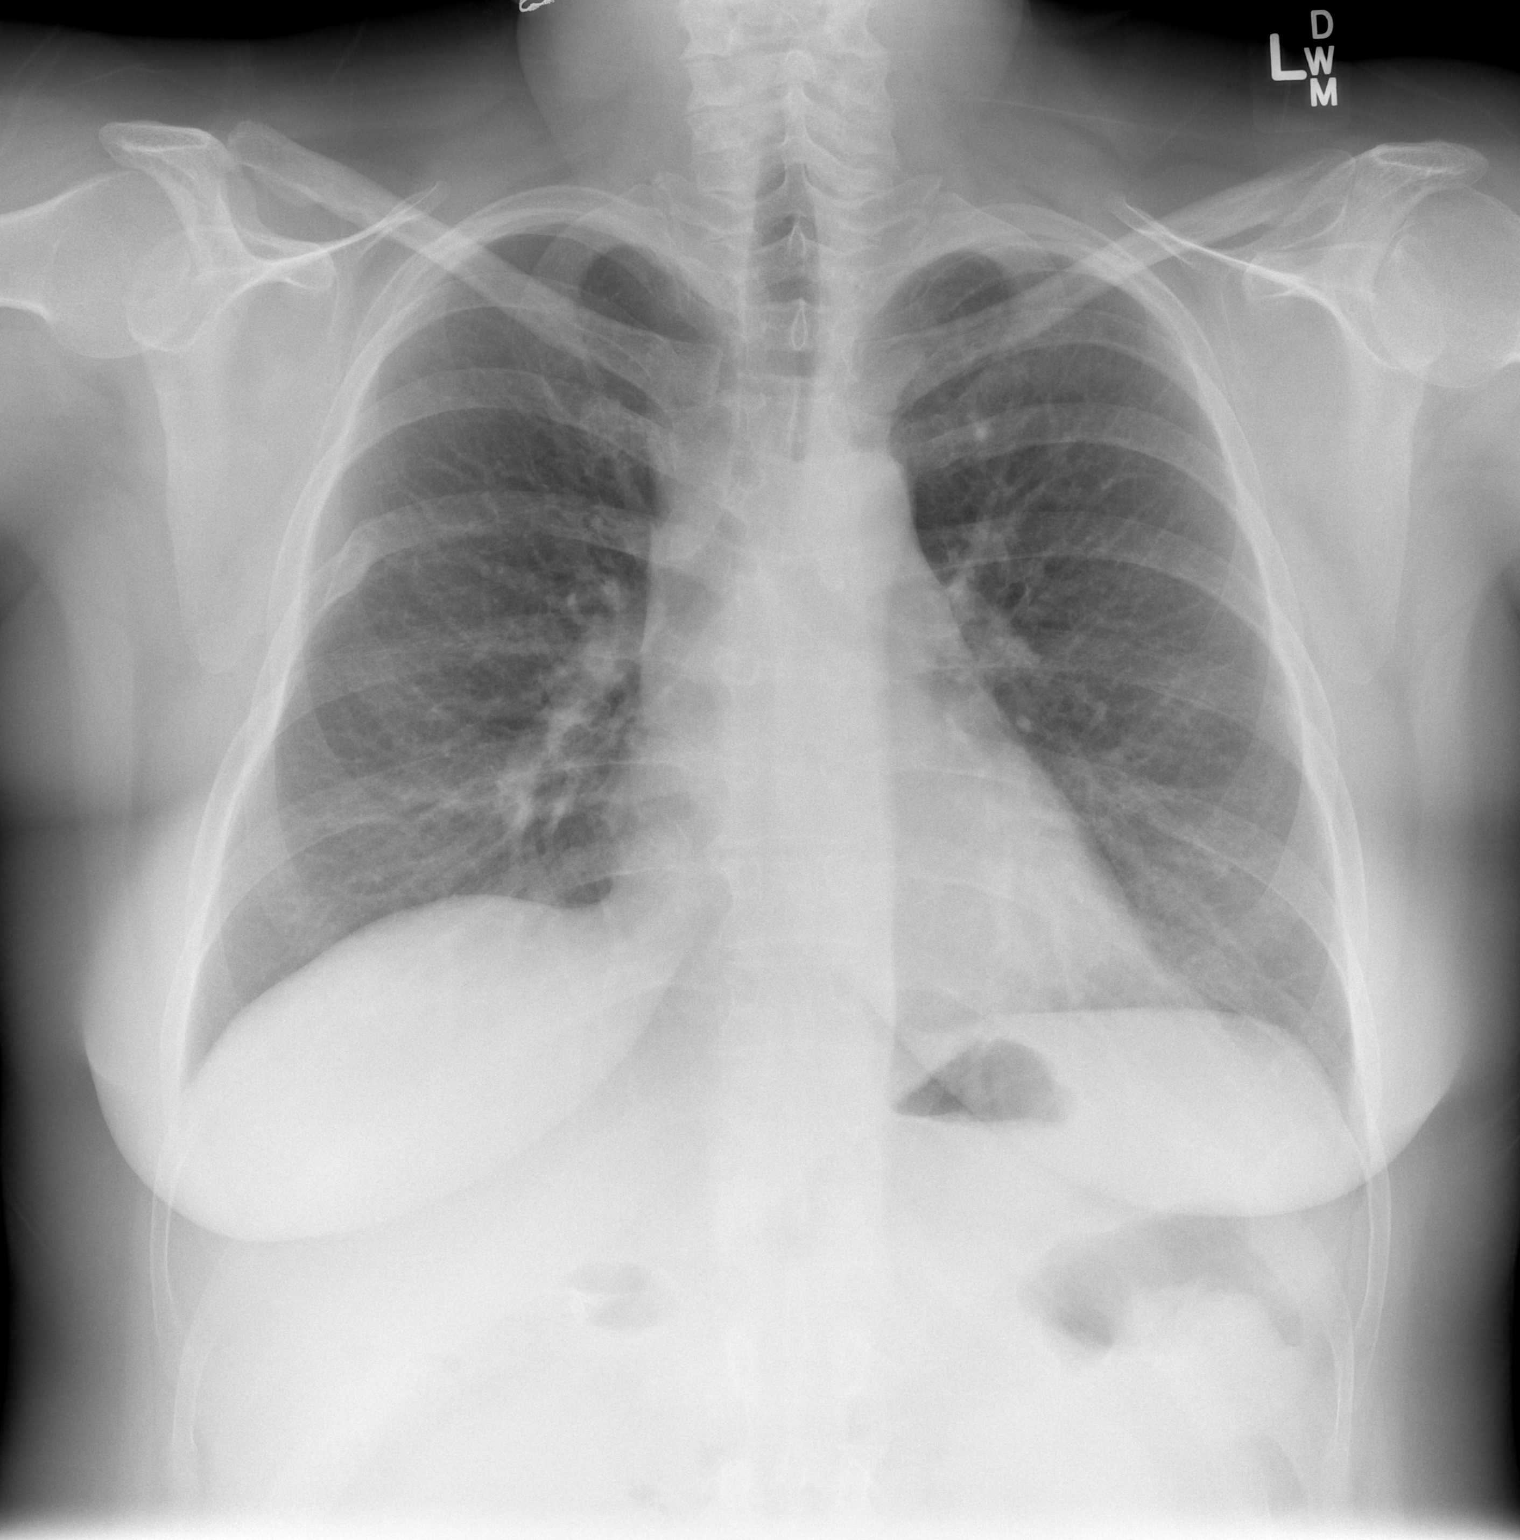

[w chest lat]
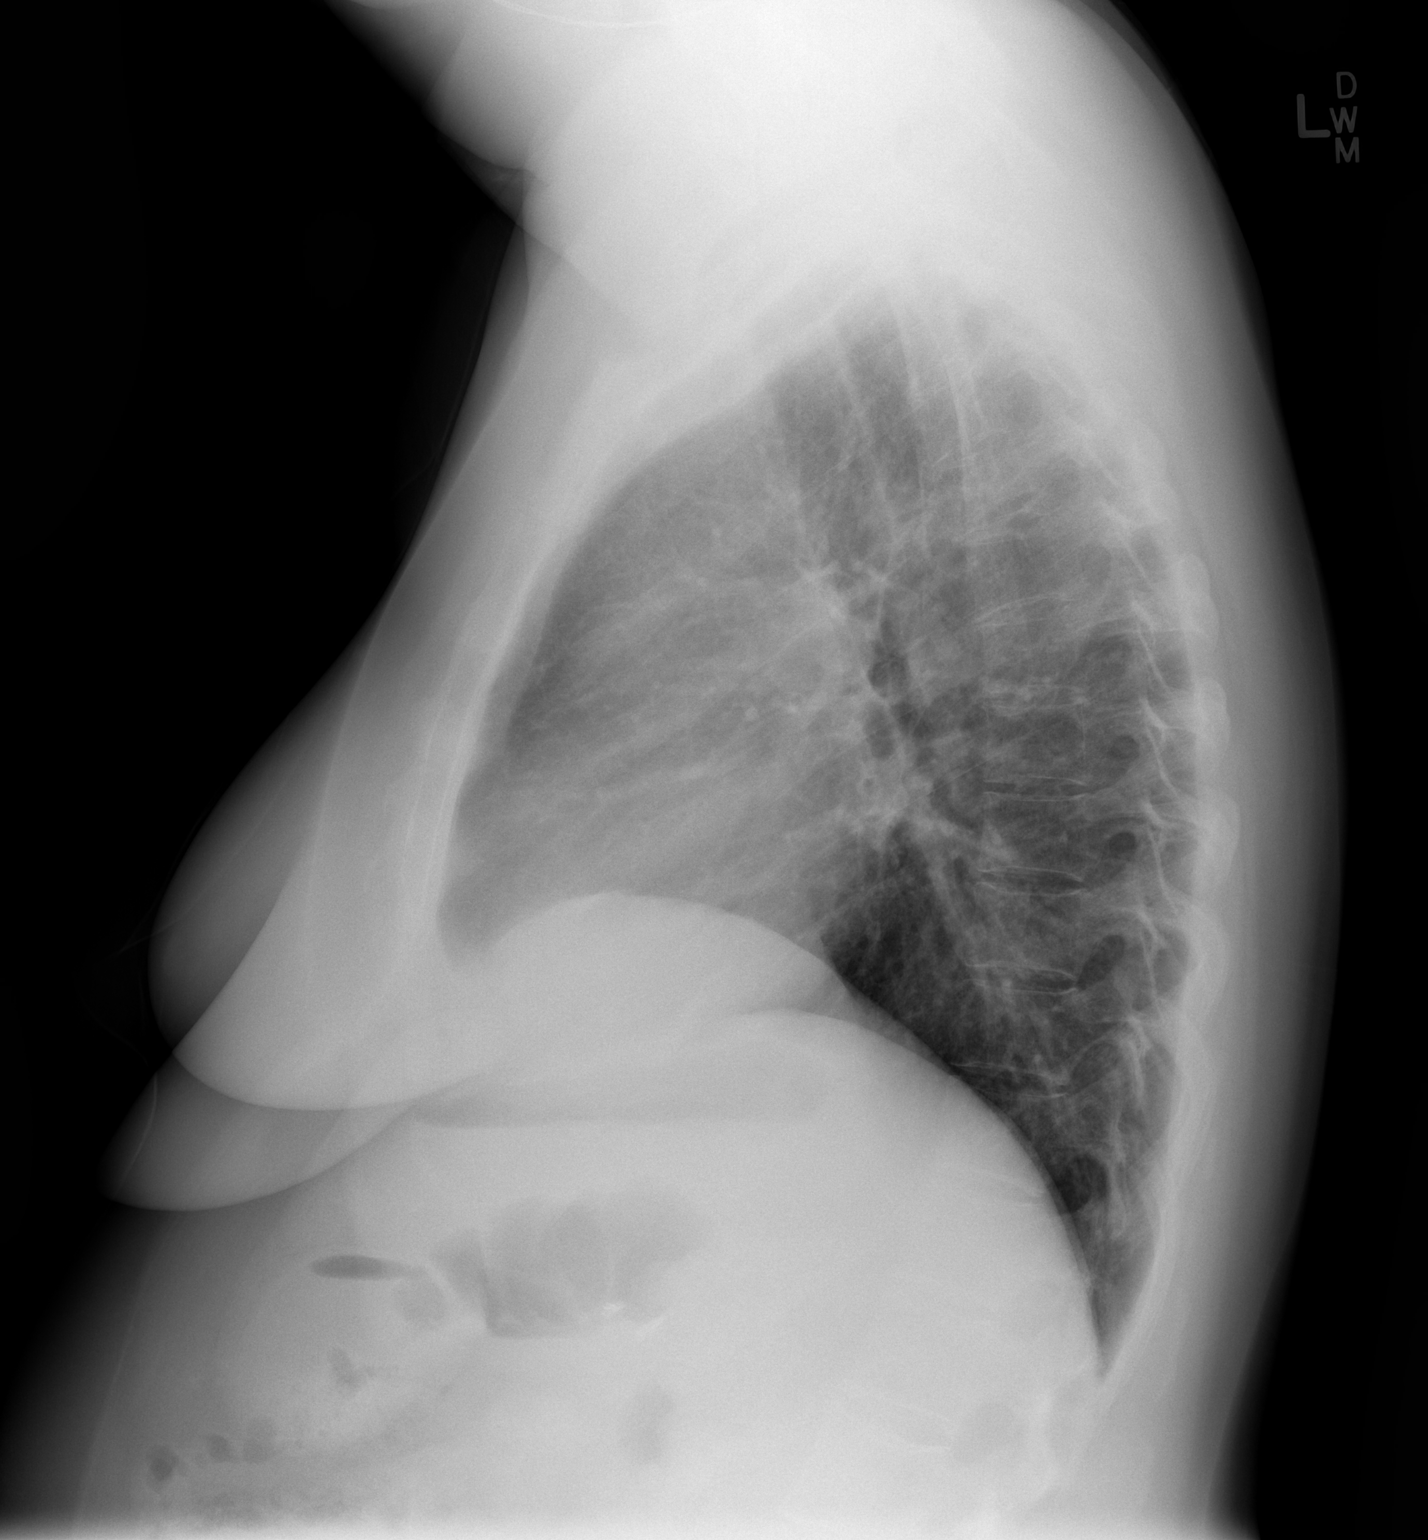

[2 of 2 positions shown; findings below may reference images not displayed]

FINDINGS: There is no edema or consolidation. The heart size and pulmonary
vascularity are normal. No adenopathy. There is an old healed
fracture of the posterolateral right sixth rib.
IMPRESSION: No edema or consolidation.

## 2015-11-24 NOTE — Progress Notes (Unsigned)
[place] Work  Clinical Social Work was referred by [employer] oncology for assessment of psychosocial needs, specifically related to coping during survivorship.  Patient returned CSW's phone call.  Mrs. Guppy shared she is having difficulty coping after cancer diagnosis, reported anger towards diagnosis, feelings of depression, and fear of recurrence.  CSW provided brief emotional support through active listening and affirmations. Patient is seeking peer connection and support.  Mrs. Nakatani participated in [facility] program (but indicated she did not complete program) and recently participated in art classes at [employer].  Based upon patient's emotional symptoms, CSW strongly encouraged patient to utilize counseling services for emotional coping skills and processing of cancer experience- patient already established with a counselor.  CSW provided information on [employer] (peer to peer mentor program) and GYN cancer support group.   Polo Riley, MSW, LCSW, OSW-C Clinical Social Worker [facility] [phone]

## 2015-11-25 ENCOUNTER — Emergency Department (HOSPITAL_BASED_OUTPATIENT_CLINIC_OR_DEPARTMENT_OTHER): Payer: Medicare Other

## 2015-11-25 ENCOUNTER — Encounter (HOSPITAL_BASED_OUTPATIENT_CLINIC_OR_DEPARTMENT_OTHER): Payer: Self-pay

## 2015-11-25 ENCOUNTER — Emergency Department (HOSPITAL_BASED_OUTPATIENT_CLINIC_OR_DEPARTMENT_OTHER)
Admission: EM | Admit: 2015-11-25 | Discharge: 2015-11-25 | Disposition: A | Discharge status: Home / Self Care | Payer: Medicare Other | Attending: Emergency Medicine | Admitting: Emergency Medicine

## 2015-11-25 DIAGNOSIS — I10 Essential (primary) hypertension: Secondary | ICD-10-CM | POA: Diagnosis not present

## 2015-11-25 DIAGNOSIS — Z8542 Personal history of malignant neoplasm of other parts of uterus: Secondary | ICD-10-CM | POA: Insufficient documentation

## 2015-11-25 DIAGNOSIS — M7989 Other specified soft tissue disorders: Secondary | ICD-10-CM

## 2015-11-25 DIAGNOSIS — J45909 Unspecified asthma, uncomplicated: Secondary | ICD-10-CM | POA: Diagnosis not present

## 2015-11-25 DIAGNOSIS — M79601 Pain in right arm: Secondary | ICD-10-CM | POA: Diagnosis present

## 2015-11-25 DIAGNOSIS — E785 Hyperlipidemia, unspecified: Secondary | ICD-10-CM | POA: Diagnosis not present

## 2015-11-25 DIAGNOSIS — Z79899 Other long term (current) drug therapy: Secondary | ICD-10-CM | POA: Insufficient documentation

## 2015-11-25 DIAGNOSIS — Z87891 Personal history of nicotine dependence: Secondary | ICD-10-CM | POA: Diagnosis not present

## 2015-11-25 MED ORDER — DICLOFENAC SODIUM 1 % TD GEL: 2.0000 g | Freq: Four times a day (QID) | TRANSDERMAL | Status: DC

## 2015-11-25 NOTE — ED Notes (Signed)
Patient c/o right arm pain and swelling, pain is intermittent and shooting from shoulder to fingers. Patient reports the shooting sensation goes to different fingers, not consistent to one digit. Patient was seen at [facility] on Monday for the same complaint, patient states she had some blood work done and an EKG, was not satisfied with the work up she received there. Patient states since then she has had swelling to the right arm, there is some swelling noted to the axilla and the elbow. Patient reports decreased strength to right arm, and increased weakness to the limb.  Patient is seen by Dr Darene Lamer, @[facility] for sports medicine. Patient has an appt with Dr T on 12/02/2015.
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
## 2015-11-25 NOTE — ED Notes (Signed)
C/o pain to entire right arm-denies injury-states pain since last visit here-NAD-steady gait

## 2015-11-25 NOTE — ED Notes (Signed)
PA at bedside.

## 2015-11-25 NOTE — Discharge Instructions (Signed)
Please read and follow all provided instructions.  Your diagnoses today include:  1. Arm swelling    Tests performed today include:  Ultrasound of arm - no blood clots  Vital signs. See below for your results today.   Medications prescribed:   Voltaren gel - topical anti-inflammatory  Take any prescribed medications only as directed.  Home care instructions:  Follow any educational materials contained in this packet.  Follow-up instructions: Please follow-up with your primary care provider in the next 7 days for further evaluation of your symptoms.   Return instructions:   Please return to the Emergency Department if you experience worsening symptoms.   Please return if you have any other emergent concerns.   Additional Information:  Your vital signs today were: BP 140/93 mmHg   Pulse 111   Temp(Src) 99.2 F (37.3 C) (Oral)   Resp 16   Ht 5\' 2"  (1.575 m)   Wt 93.895 kg   BMI 37.85 kg/m2   SpO2 98%   LMP 06/25/2013 If your blood pressure (BP) was elevated above 135/85 this visit, please have this repeated by your doctor within one month. --------------

## 2015-11-25 NOTE — ED Notes (Signed)
Patient transported to Ultrasound 

## 2015-11-29 DIAGNOSIS — H538 Other visual disturbances: Secondary | ICD-10-CM | POA: Diagnosis not present

## 2015-11-29 DIAGNOSIS — R05 Cough: Secondary | ICD-10-CM | POA: Diagnosis not present

## 2015-11-29 DIAGNOSIS — Z885 Allergy status to narcotic agent status: Secondary | ICD-10-CM | POA: Diagnosis not present

## 2015-11-29 DIAGNOSIS — R0602 Shortness of breath: Secondary | ICD-10-CM | POA: Diagnosis not present

## 2015-11-29 DIAGNOSIS — Z886 Allergy status to analgesic agent status: Secondary | ICD-10-CM | POA: Diagnosis not present

## 2015-11-29 DIAGNOSIS — G35 Multiple sclerosis: Secondary | ICD-10-CM | POA: Diagnosis not present

## 2015-11-29 DIAGNOSIS — I10 Essential (primary) hypertension: Secondary | ICD-10-CM | POA: Diagnosis not present

## 2015-11-29 DIAGNOSIS — Z888 Allergy status to other drugs, medicaments and biological substances status: Secondary | ICD-10-CM | POA: Diagnosis not present

## 2015-11-29 DIAGNOSIS — J45909 Unspecified asthma, uncomplicated: Secondary | ICD-10-CM | POA: Diagnosis not present

## 2015-11-29 DIAGNOSIS — H539 Unspecified visual disturbance: Secondary | ICD-10-CM | POA: Diagnosis not present

## 2015-12-01 ENCOUNTER — Ambulatory Visit (INDEPENDENT_AMBULATORY_CARE_PROVIDER_SITE_OTHER): Payer: Medicare Other | Admitting: Family Medicine

## 2015-12-01 ENCOUNTER — Ambulatory Visit: Payer: Self-pay | Admitting: Family Medicine

## 2015-12-01 ENCOUNTER — Encounter: Payer: Self-pay | Admitting: Family Medicine

## 2015-12-01 VITALS — BP 144/82 | HR 86 | Wt 208.0 lb

## 2015-12-01 DIAGNOSIS — H9201 Otalgia, right ear: Secondary | ICD-10-CM

## 2015-12-01 DIAGNOSIS — I10 Essential (primary) hypertension: Secondary | ICD-10-CM | POA: Diagnosis not present

## 2015-12-01 DIAGNOSIS — M545 Low back pain, unspecified: Secondary | ICD-10-CM

## 2015-12-01 DIAGNOSIS — H538 Other visual disturbances: Secondary | ICD-10-CM

## 2015-12-01 DIAGNOSIS — E876 Hypokalemia: Secondary | ICD-10-CM | POA: Diagnosis not present

## 2015-12-01 MED ORDER — AMLODIPINE BESYLATE 2.5 MG PO TABS: 2.5000 mg | ORAL_TABLET | Freq: Every day | ORAL | Status: DC
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
## 2015-12-02 ENCOUNTER — Ambulatory Visit (INDEPENDENT_AMBULATORY_CARE_PROVIDER_SITE_OTHER): Payer: Medicare Other | Admitting: Sports Medicine

## 2015-12-02 ENCOUNTER — Telehealth: Payer: Self-pay | Admitting: Sports Medicine

## 2015-12-02 ENCOUNTER — Telehealth: Payer: Self-pay | Admitting: *Deleted

## 2015-12-02 ENCOUNTER — Encounter: Payer: Self-pay | Admitting: Sports Medicine

## 2015-12-02 VITALS — BP 134/82 | HR 102 | Resp 18 | Wt 208.1 lb

## 2015-12-02 DIAGNOSIS — M25551 Pain in right hip: Secondary | ICD-10-CM

## 2015-12-02 DIAGNOSIS — M129 Arthropathy, unspecified: Secondary | ICD-10-CM

## 2015-12-02 DIAGNOSIS — M19019 Primary osteoarthritis, unspecified shoulder: Secondary | ICD-10-CM | POA: Insufficient documentation

## 2015-12-02 DIAGNOSIS — R000 Tachycardia, unspecified: Secondary | ICD-10-CM | POA: Diagnosis not present

## 2015-12-02 DIAGNOSIS — M1711 Unilateral primary osteoarthritis, right knee: Secondary | ICD-10-CM | POA: Diagnosis not present

## 2015-12-02 DIAGNOSIS — Z202 Contact with and (suspected) exposure to infections with a predominantly sexual mode of transmission: Secondary | ICD-10-CM

## 2015-12-02 DIAGNOSIS — I10 Essential (primary) hypertension: Secondary | ICD-10-CM | POA: Diagnosis not present

## 2015-12-02 DIAGNOSIS — R0789 Other chest pain: Secondary | ICD-10-CM | POA: Diagnosis not present

## 2015-12-02 DIAGNOSIS — M19011 Primary osteoarthritis, right shoulder: Secondary | ICD-10-CM

## 2015-12-02 NOTE — Assessment & Plan Note (Signed)
MRI from [place] shows right acromioclavicular arthritis, acromial spurring, tear in the posterior labrum with some labral cyst, no rotator cuff disease. Acromioclavicular injection as above.

## 2015-12-02 NOTE — Telephone Encounter (Signed)
Options include NSAIDS, additional steroid injections, or saving up for visco.  She can pick.

## 2015-12-02 NOTE — Telephone Encounter (Signed)
Submitted for approval on Orthovisc. Awaiting confirmation.  

## 2015-12-02 NOTE — Telephone Encounter (Signed)
-----   Message from Silverio Decamp, MD sent at 12/02/2015 10:35 AM EDT ----- OrthoVisc right knee, xray confirmed OA, failed steroid injection. Fayrene Fearing

## 2015-12-02 NOTE — Telephone Encounter (Signed)
Pt's lab orders faxed to [facility] lab. She will be going to have them drawn tomorrow.Jennifer [name]

## 2015-12-02 NOTE — Progress Notes (Signed)
  Subjective:    CC: follow-up  HPI: Right shoulder pain: MRI done at an outside facility shows a posterior labrum tear as well as acromioclavicular arthritis, has failed NSAIDs and home rehabilitation exercises.  Right knee osteoarthritis: X-ray confirmed, failed steroid injection at the last visit.  Low back pain: With L5-S1 degenerative disc disease, failed physical therapy, agreeable to proceed with interventional treatment.  Past medical history, Surgical history, Family history not pertinant except as noted below, Social history, Allergies, and medications have been entered into the medical record, reviewed, and no changes needed.   Review of Systems: No fevers, chills, night sweats, weight loss, chest pain, or shortness of breath.   Objective:    General: Well Developed, well nourished, and in no acute distress.  Neuro: Alert and oriented x3, extra-ocular muscles intact, sensation grossly intact.  HEENT: Normocephalic, atraumatic, pupils equal round reactive to light, neck supple, no masses, no lymphadenopathy, thyroid nonpalpable.  Skin: Warm and dry, no rashes. Cardiac: Regular rate and rhythm, no murmurs rubs or gallops, no lower extremity edema.  Respiratory: Clear to auscultation bilaterally. Not using accessory muscles, speaking in full sentences. Right Shoulder: Inspection reveals no abnormalities, atrophy or asymmetry. Tender over the acromioclavicular joint ROM is full in all planes. Rotator cuff strength normal throughout. No signs of impingement with negative Neer and Hawkin's tests, empty can. Speeds and Yergason's tests normal. No labral pathology noted with negative Obrien's, negative crank, negative clunk, and good stability. Normal scapular function observed. No painful arc and no drop arm sign. No apprehension sign  Procedure: Real-time Ultrasound Guided Injection of right acromioclavicular joint Device: GE Logiq E  Verbal informed consent obtained.    Time-out conducted.  Noted no overlying erythema, induration, or other signs of local infection.  Skin prepped in a sterile fashion.  Local anesthesia: Topical Ethyl chloride.  With sterile technique and under real time ultrasound guidance:  1/2 mL lidocaine, 1/2 mL kenalog 40 injected easily. Completed without difficulty  Pain immediately resolved suggesting accurate placement of the medication.  Advised to call if fevers/chills, erythema, induration, drainage, or persistent bleeding.  Images permanently stored and available for review in the ultrasound unit.  Impression: Technically successful ultrasound guided injection.  Impression and Recommendations:

## 2015-12-02 NOTE — Telephone Encounter (Signed)
Spoke with Pt, she states she had an MRI on the affected knee in the past and they recommended her get a knee scope. Will route to Dr. Dianah Field for review. Attempted to see if an Ortho referral was required if she would prefer [facility] or [facility]- Pt unable to determine which would be easiest to get to. She will think about it.

## 2015-12-02 NOTE — Assessment & Plan Note (Signed)
Minimal response to steroid injection, we are going to get her approved for Orthovisc.

## 2015-12-02 NOTE — Telephone Encounter (Signed)
Receieved the following information from OV benefits investigation:  Patient has [employer] Medicare complete 2 HMO plan with an effective date of 10/13/2015. [id number] is covered at 80% & [id number] is covered at 100% of the contracted rate when performed in an office setting. *Deductible does not apply. If out of pocket is met, coverage goes to 100% & copay will no longer apply. $35.00 copay for specialist office visit applies. Ref# [id number]  Called and spoke with Pt, advised of her estimated OOP cost. Pt states she can not afford anything OOP because she lives "paycheck to paycheck." Pt states "Dr. Darene Lamer is going to have to come up with something better and cheaper than that to help me." Will route for review.

## 2015-12-02 NOTE — Assessment & Plan Note (Signed)
Insufficient improvement with physical therapy. At this point we are going to proceed with a right L5-S1 interlaminar epidural.

## 2015-12-03 ENCOUNTER — Telehealth: Payer: Self-pay | Admitting: *Deleted

## 2015-12-03 NOTE — Telephone Encounter (Signed)
Pharmacy called related to Rx: diclofenac sodium (VOLTAREN) 1 % GEL not covered by pt insurance.

## 2015-12-03 NOTE — Telephone Encounter (Signed)
Noted, she can let me know.

## 2015-12-06 ENCOUNTER — Telehealth: Payer: Self-pay | Admitting: Family Medicine

## 2015-12-06 DIAGNOSIS — S43431D Superior glenoid labrum lesion of right shoulder, subsequent encounter: Secondary | ICD-10-CM

## 2015-12-06 DIAGNOSIS — M19011 Primary osteoarthritis, right shoulder: Secondary | ICD-10-CM

## 2015-12-06 NOTE — Telephone Encounter (Signed)
Pt called office stating she got an injection in her right shoulder by Dr. Darene Lamer and now she can't "lift her arm as high as she used too." Pt states tylenol doesn't help with the pain. Will route to Provider for review.

## 2015-12-07 ENCOUNTER — Ambulatory Visit: Payer: Medicare Other | Admitting: Pediatrics

## 2015-12-07 DIAGNOSIS — G40909 Epilepsy, unspecified, not intractable, without status epilepticus: Secondary | ICD-10-CM | POA: Diagnosis not present

## 2015-12-07 DIAGNOSIS — F4310 Post-traumatic stress disorder, unspecified: Secondary | ICD-10-CM | POA: Diagnosis not present

## 2015-12-07 DIAGNOSIS — Z8542 Personal history of malignant neoplasm of other parts of uterus: Secondary | ICD-10-CM | POA: Diagnosis not present

## 2015-12-07 DIAGNOSIS — Z885 Allergy status to narcotic agent status: Secondary | ICD-10-CM | POA: Diagnosis not present

## 2015-12-07 DIAGNOSIS — Z888 Allergy status to other drugs, medicaments and biological substances status: Secondary | ICD-10-CM | POA: Diagnosis not present

## 2015-12-07 DIAGNOSIS — D649 Anemia, unspecified: Secondary | ICD-10-CM | POA: Diagnosis not present

## 2015-12-07 DIAGNOSIS — M25511 Pain in right shoulder: Secondary | ICD-10-CM | POA: Diagnosis not present

## 2015-12-07 DIAGNOSIS — Z87891 Personal history of nicotine dependence: Secondary | ICD-10-CM | POA: Diagnosis not present

## 2015-12-07 DIAGNOSIS — Z114 Encounter for screening for human immunodeficiency virus [HIV]: Secondary | ICD-10-CM | POA: Diagnosis not present

## 2015-12-07 DIAGNOSIS — I10 Essential (primary) hypertension: Secondary | ICD-10-CM | POA: Diagnosis not present

## 2015-12-07 DIAGNOSIS — G35 Multiple sclerosis: Secondary | ICD-10-CM | POA: Diagnosis not present

## 2015-12-07 DIAGNOSIS — Z886 Allergy status to analgesic agent status: Secondary | ICD-10-CM | POA: Diagnosis not present

## 2015-12-07 LAB — BASIC METABOLIC PANEL
BUN: 17 mg/dL (ref 4–21)
CO2: 24 mmol/L
Calcium: 8.8 mg/dL
Chloride: 105 mmol/L
Creatinine: 0.5 mg/dL (ref 0.5–1.1)
Glucose: 125 mg/dL
Potassium: 3.7 mmol/L (ref 3.4–5.3)
Sodium: 143 mmol/L (ref 137–147)

## 2015-12-07 LAB — CBC AND DIFFERENTIAL
HCT: 42 % (ref 36–46)
Hemoglobin: 13.8 g/dL (ref 12.0–16.0)
Platelets: 216 10*3/uL (ref 150–399)
WBC: 7 10^3/mL

## 2015-12-07 LAB — HEPATITIS C ANTIBODY
Hepatitis C Ab: NEGATIVE
Hepatitis C Ab: NEGATIVE

## 2015-12-07 LAB — CBC
MCH: 30.4
MCHC: 32.6
MCV: 93.2
MPV: 10.5 fL (ref 7.8–11)
RBC: 4.54
RDW: 13.1

## 2015-12-07 LAB — HSV 1 ANTIBODY, IGG
HSV 1 IgG Index:: NEGATIVE
HSV 2 IgG: NEGATIVE

## 2015-12-07 LAB — HIV ANTIBODY: HIV 2 Ab: NEGATIVE

## 2015-12-07 NOTE — Telephone Encounter (Signed)
Pt states she went to the ED this am and they put her arm in a sling. She states the pain is still bad. Will route to Provider for recommendation.

## 2015-12-07 NOTE — Telephone Encounter (Signed)
Give it time, also need some physical therapy.if she doesn't want to do this, then we can refer to shoulder surgery. She has a labral tear and acromioclavicular arthritis, so she would be a candidate for arthroscopic labral repair and distal clavicle excision.  Orders placed for physical therapy.

## 2015-12-07 NOTE — Telephone Encounter (Signed)
Pt states she cannot afford PT because it's $40 every trip. Per verbal from Dr. Darene Lamer, [place] to place referral for Ortho in [facility] per Pt request.

## 2015-12-09 ENCOUNTER — Telehealth: Payer: Self-pay

## 2015-12-09 ENCOUNTER — Emergency Department (HOSPITAL_BASED_OUTPATIENT_CLINIC_OR_DEPARTMENT_OTHER)
Admission: EM | Admit: 2015-12-09 | Discharge: 2015-12-09 | Disposition: A | Discharge status: Home / Self Care | Payer: Medicare Other | Attending: Emergency Medicine | Admitting: Emergency Medicine

## 2015-12-09 ENCOUNTER — Encounter (HOSPITAL_BASED_OUTPATIENT_CLINIC_OR_DEPARTMENT_OTHER): Payer: Self-pay | Admitting: Emergency Medicine

## 2015-12-09 DIAGNOSIS — Z87891 Personal history of nicotine dependence: Secondary | ICD-10-CM | POA: Insufficient documentation

## 2015-12-09 DIAGNOSIS — R110 Nausea: Secondary | ICD-10-CM | POA: Insufficient documentation

## 2015-12-09 DIAGNOSIS — J45909 Unspecified asthma, uncomplicated: Secondary | ICD-10-CM | POA: Insufficient documentation

## 2015-12-09 DIAGNOSIS — Z79899 Other long term (current) drug therapy: Secondary | ICD-10-CM | POA: Diagnosis not present

## 2015-12-09 DIAGNOSIS — R1013 Epigastric pain: Secondary | ICD-10-CM | POA: Diagnosis not present

## 2015-12-09 DIAGNOSIS — I10 Essential (primary) hypertension: Secondary | ICD-10-CM | POA: Diagnosis not present

## 2015-12-09 DIAGNOSIS — E785 Hyperlipidemia, unspecified: Secondary | ICD-10-CM | POA: Insufficient documentation

## 2015-12-09 LAB — CBC WITH DIFFERENTIAL/PLATELET
Basophils Absolute: 0 10*3/uL (ref 0.0–0.1)
Basophils Relative: 0 %
Eosinophils Absolute: 0.2 10*3/uL (ref 0.0–0.7)
Eosinophils Relative: 2 %
HCT: 42.6 % (ref 36.0–46.0)
Hemoglobin: 14.2 g/dL (ref 12.0–15.0)
Lymphocytes Relative: 22 %
Lymphs Abs: 1.6 10*3/uL (ref 0.7–4.0)
MCH: 30.9 pg (ref 26.0–34.0)
MCHC: 33.3 g/dL (ref 30.0–36.0)
MCV: 92.8 fL (ref 78.0–100.0)
Monocytes Absolute: 0.6 10*3/uL (ref 0.1–1.0)
Monocytes Relative: 8 %
Neutro Abs: 5.1 10*3/uL (ref 1.7–7.7)
Neutrophils Relative %: 68 %
Platelets: 209 10*3/uL (ref 150–400)
RBC: 4.59 MIL/uL (ref 3.87–5.11)
RDW: 12.9 % (ref 11.5–15.5)
WBC: 7.5 10*3/uL (ref 4.0–10.5)

## 2015-12-09 LAB — COMPREHENSIVE METABOLIC PANEL
ALT: 18 U/L (ref 14–54)
AST: 14 U/L — ABNORMAL LOW (ref 15–41)
Albumin: 4 g/dL (ref 3.5–5.0)
Alkaline Phosphatase: 79 U/L (ref 38–126)
Anion gap: 4 — ABNORMAL LOW (ref 5–15)
BUN: 16 mg/dL (ref 6–20)
CO2: 23 mmol/L (ref 22–32)
Calcium: 8.7 mg/dL — ABNORMAL LOW (ref 8.9–10.3)
Chloride: 109 mmol/L (ref 101–111)
Creatinine, Ser: 0.48 mg/dL (ref 0.44–1.00)
GFR calc Af Amer: >60 mL/min (ref >60)
GFR calc non Af Amer: >60 mL/min (ref >60)
Glucose, Bld: 86 mg/dL (ref 65–99)
Potassium: 3.6 mmol/L (ref 3.5–5.1)
Sodium: 136 mmol/L (ref 135–145)
Total Bilirubin: 0.5 mg/dL (ref 0.3–1.2)
Total Protein: 6.9 g/dL (ref 6.5–8.1)

## 2015-12-09 LAB — URINALYSIS, ROUTINE W REFLEX MICROSCOPIC
Bilirubin Urine: NEGATIVE
Glucose, UA: NEGATIVE mg/dL
Hgb urine dipstick: NEGATIVE
Ketones, ur: NEGATIVE mg/dL
Leukocytes, UA: NEGATIVE
Nitrite: NEGATIVE
Protein, ur: NEGATIVE mg/dL
Specific Gravity, Urine: 1.026 (ref 1.005–1.030)
pH: 5.5 (ref 5.0–8.0)

## 2015-12-09 LAB — LIPASE, BLOOD: Lipase: 33 U/L (ref 11–51)

## 2015-12-09 LAB — TROPONIN I: Troponin I: <0.03 ng/mL (ref <0.031)

## 2015-12-09 MED ORDER — DICYCLOMINE HCL 20 MG PO TABS: 20.0000 mg | ORAL_TABLET | Freq: Two times a day (BID) | ORAL | Status: DC

## 2015-12-09 MED ORDER — ONDANSETRON HCL 4 MG/2ML IJ SOLN
4.0000 mg | Freq: Once | INTRAMUSCULAR | Status: AC
  Administered 2015-12-09: 4 mg via INTRAVENOUS
  Filled 2015-12-09: qty 2

## 2015-12-09 MED ORDER — SODIUM CHLORIDE 0.9 % IV BOLUS (SEPSIS)
1000.0000 mL | Freq: Once | INTRAVENOUS | Status: AC
  Administered 2015-12-09: 1000 mL via INTRAVENOUS

## 2015-12-09 MED ORDER — ALUM & MAG HYDROXIDE-SIMETH 200-200-20 MG/5ML PO SUSP: 15.0000 mL | Freq: Four times a day (QID) | ORAL | Status: DC | PRN

## 2015-12-09 MED ORDER — FAMOTIDINE 20 MG PO TABS: 20.0000 mg | ORAL_TABLET | Freq: Two times a day (BID) | ORAL | Status: DC

## 2015-12-09 MED ORDER — ALUM & MAG HYDROXIDE-SIMETH 200-200-20 MG/5ML PO SUSP
15.0000 mL | Freq: Once | ORAL | Status: AC
  Administered 2015-12-09: 15 mL via ORAL
  Filled 2015-12-09: qty 30

## 2015-12-09 MED ORDER — FAMOTIDINE IN NACL 20-0.9 MG/50ML-% IV SOLN
20.0000 mg | Freq: Once | INTRAVENOUS | Status: AC
  Administered 2015-12-09: 20 mg via INTRAVENOUS
  Filled 2015-12-09: qty 50

## 2015-12-09 MED ORDER — PANTOPRAZOLE SODIUM 40 MG PO TBEC
40.0000 mg | DELAYED_RELEASE_TABLET | Freq: Once | ORAL | Status: DC
  Filled 2015-12-09: qty 1

## 2015-12-09 NOTE — ED Notes (Signed)
Attempted to stick patient for labs.  Stuck left hand, pt refused to allow to flush.  Removed IV.

## 2015-12-09 NOTE — Telephone Encounter (Signed)
ok 
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
## 2015-12-09 NOTE — ED Notes (Signed)
Pt story changed multiple times regarding abdominal  Pain, wanted to talk about shoulder pain and treatment that was done, when redirected pt is mainly concerned about dehydration. Has not notified her PCP

## 2015-12-09 NOTE — Discharge Instructions (Signed)

## 2015-12-09 NOTE — ED Notes (Signed)
Pt reports that she had injection of dexamathosone at urgent care due to arm pain on thur, this weekend she developed upper abdominal pain and felt "light headed" over weekend, presents today for  Evaluation of nausea, but no vomiting

## 2015-12-10 ENCOUNTER — Encounter: Payer: Self-pay | Admitting: Family Medicine

## 2015-12-11 DIAGNOSIS — D329 Benign neoplasm of meninges, unspecified: Secondary | ICD-10-CM | POA: Diagnosis not present

## 2015-12-11 DIAGNOSIS — R51 Headache: Secondary | ICD-10-CM | POA: Diagnosis not present

## 2015-12-11 DIAGNOSIS — D320 Benign neoplasm of cerebral meninges: Secondary | ICD-10-CM | POA: Diagnosis not present

## 2015-12-13 ENCOUNTER — Encounter: Payer: Self-pay | Admitting: Family Medicine

## 2015-12-13 DIAGNOSIS — Z880 Allergy status to penicillin: Secondary | ICD-10-CM | POA: Diagnosis not present

## 2015-12-13 DIAGNOSIS — R9431 Abnormal electrocardiogram [ECG] [EKG]: Secondary | ICD-10-CM | POA: Diagnosis not present

## 2015-12-13 DIAGNOSIS — Z79899 Other long term (current) drug therapy: Secondary | ICD-10-CM | POA: Diagnosis not present

## 2015-12-13 DIAGNOSIS — Z91018 Allergy to other foods: Secondary | ICD-10-CM | POA: Diagnosis not present

## 2015-12-13 DIAGNOSIS — E785 Hyperlipidemia, unspecified: Secondary | ICD-10-CM | POA: Diagnosis not present

## 2015-12-13 DIAGNOSIS — Z885 Allergy status to narcotic agent status: Secondary | ICD-10-CM | POA: Diagnosis not present

## 2015-12-13 DIAGNOSIS — R05 Cough: Secondary | ICD-10-CM | POA: Diagnosis not present

## 2015-12-13 DIAGNOSIS — Z888 Allergy status to other drugs, medicaments and biological substances status: Secondary | ICD-10-CM | POA: Diagnosis not present

## 2015-12-13 DIAGNOSIS — Z7951 Long term (current) use of inhaled steroids: Secondary | ICD-10-CM | POA: Diagnosis not present

## 2015-12-13 DIAGNOSIS — R42 Dizziness and giddiness: Secondary | ICD-10-CM | POA: Diagnosis not present

## 2015-12-13 DIAGNOSIS — G35 Multiple sclerosis: Secondary | ICD-10-CM | POA: Diagnosis not present

## 2015-12-13 DIAGNOSIS — Z883 Allergy status to other anti-infective agents status: Secondary | ICD-10-CM | POA: Diagnosis not present

## 2015-12-13 DIAGNOSIS — Z87891 Personal history of nicotine dependence: Secondary | ICD-10-CM | POA: Diagnosis not present

## 2015-12-13 DIAGNOSIS — I10 Essential (primary) hypertension: Secondary | ICD-10-CM | POA: Diagnosis not present

## 2015-12-13 DIAGNOSIS — R55 Syncope and collapse: Secondary | ICD-10-CM | POA: Diagnosis not present

## 2015-12-13 DIAGNOSIS — R531 Weakness: Secondary | ICD-10-CM | POA: Diagnosis not present

## 2015-12-13 DIAGNOSIS — Z8669 Personal history of other diseases of the nervous system and sense organs: Secondary | ICD-10-CM | POA: Diagnosis not present

## 2015-12-13 DIAGNOSIS — Z882 Allergy status to sulfonamides status: Secondary | ICD-10-CM | POA: Diagnosis not present

## 2015-12-13 DIAGNOSIS — K219 Gastro-esophageal reflux disease without esophagitis: Secondary | ICD-10-CM | POA: Diagnosis not present

## 2015-12-14 ENCOUNTER — Encounter: Payer: Self-pay | Admitting: Family Medicine

## 2015-12-14 ENCOUNTER — Telehealth: Payer: Self-pay | Admitting: Family Medicine

## 2015-12-14 DIAGNOSIS — J45909 Unspecified asthma, uncomplicated: Secondary | ICD-10-CM | POA: Diagnosis not present

## 2015-12-14 DIAGNOSIS — R61 Generalized hyperhidrosis: Secondary | ICD-10-CM | POA: Diagnosis not present

## 2015-12-14 DIAGNOSIS — R110 Nausea: Secondary | ICD-10-CM | POA: Diagnosis not present

## 2015-12-14 DIAGNOSIS — R0602 Shortness of breath: Secondary | ICD-10-CM | POA: Diagnosis not present

## 2015-12-14 DIAGNOSIS — G35 Multiple sclerosis: Secondary | ICD-10-CM | POA: Diagnosis not present

## 2015-12-14 DIAGNOSIS — R109 Unspecified abdominal pain: Secondary | ICD-10-CM | POA: Diagnosis not present

## 2015-12-14 DIAGNOSIS — R05 Cough: Secondary | ICD-10-CM | POA: Diagnosis not present

## 2015-12-14 DIAGNOSIS — Z87891 Personal history of nicotine dependence: Secondary | ICD-10-CM | POA: Diagnosis not present

## 2015-12-14 DIAGNOSIS — G4733 Obstructive sleep apnea (adult) (pediatric): Secondary | ICD-10-CM | POA: Diagnosis not present

## 2015-12-14 DIAGNOSIS — I10 Essential (primary) hypertension: Secondary | ICD-10-CM | POA: Diagnosis not present

## 2015-12-14 DIAGNOSIS — Z886 Allergy status to analgesic agent status: Secondary | ICD-10-CM | POA: Diagnosis not present

## 2015-12-14 DIAGNOSIS — D509 Iron deficiency anemia, unspecified: Secondary | ICD-10-CM | POA: Diagnosis not present

## 2015-12-14 DIAGNOSIS — G8929 Other chronic pain: Secondary | ICD-10-CM | POA: Diagnosis not present

## 2015-12-14 DIAGNOSIS — R569 Unspecified convulsions: Secondary | ICD-10-CM | POA: Diagnosis not present

## 2015-12-14 NOTE — Telephone Encounter (Signed)
Left detailed message on patient vm with results as noted below. Minnie Legros,CMA  

## 2015-12-14 NOTE — Telephone Encounter (Signed)
Call pt: alll labs look great. Neg for HIV, Hep c, etc

## 2015-12-15 ENCOUNTER — Encounter: Payer: Self-pay | Admitting: Family Medicine

## 2015-12-15 ENCOUNTER — Ambulatory Visit (INDEPENDENT_AMBULATORY_CARE_PROVIDER_SITE_OTHER): Payer: Medicare Other | Admitting: Family Medicine

## 2015-12-15 VITALS — BP 151/95 | HR 97 | Wt 198.0 lb

## 2015-12-15 DIAGNOSIS — I10 Essential (primary) hypertension: Secondary | ICD-10-CM

## 2015-12-15 DIAGNOSIS — F458 Other somatoform disorders: Secondary | ICD-10-CM | POA: Diagnosis not present

## 2015-12-15 DIAGNOSIS — R09A2 Foreign body sensation, throat: Secondary | ICD-10-CM

## 2015-12-15 DIAGNOSIS — K5900 Constipation, unspecified: Secondary | ICD-10-CM | POA: Diagnosis not present

## 2015-12-15 DIAGNOSIS — R198 Other specified symptoms and signs involving the digestive system and abdomen: Secondary | ICD-10-CM

## 2015-12-15 DIAGNOSIS — E162 Hypoglycemia, unspecified: Secondary | ICD-10-CM | POA: Diagnosis not present

## 2015-12-15 DIAGNOSIS — R0989 Other specified symptoms and signs involving the circulatory and respiratory systems: Secondary | ICD-10-CM

## 2015-12-15 DIAGNOSIS — K5909 Other constipation: Secondary | ICD-10-CM

## 2015-12-15 MED ORDER — NEBIVOLOL HCL 2.5 MG PO TABS: 2.5000 mg | ORAL_TABLET | Freq: Every day | ORAL | Status: DC
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
## 2015-12-16 ENCOUNTER — Emergency Department (HOSPITAL_BASED_OUTPATIENT_CLINIC_OR_DEPARTMENT_OTHER)
Admission: EM | Admit: 2015-12-16 | Discharge: 2015-12-16 | Disposition: A | Discharge status: Home / Self Care | Payer: Medicare Other | Attending: Emergency Medicine | Admitting: Emergency Medicine

## 2015-12-16 ENCOUNTER — Emergency Department (HOSPITAL_BASED_OUTPATIENT_CLINIC_OR_DEPARTMENT_OTHER): Payer: Medicare Other

## 2015-12-16 ENCOUNTER — Encounter (HOSPITAL_BASED_OUTPATIENT_CLINIC_OR_DEPARTMENT_OTHER): Payer: Self-pay | Admitting: *Deleted

## 2015-12-16 ENCOUNTER — Telehealth: Payer: Self-pay | Admitting: *Deleted

## 2015-12-16 DIAGNOSIS — Y9302 Activity, running: Secondary | ICD-10-CM | POA: Insufficient documentation

## 2015-12-16 DIAGNOSIS — Z87891 Personal history of nicotine dependence: Secondary | ICD-10-CM | POA: Diagnosis not present

## 2015-12-16 DIAGNOSIS — S99921A Unspecified injury of right foot, initial encounter: Secondary | ICD-10-CM | POA: Diagnosis present

## 2015-12-16 DIAGNOSIS — I10 Essential (primary) hypertension: Secondary | ICD-10-CM | POA: Insufficient documentation

## 2015-12-16 DIAGNOSIS — Z8542 Personal history of malignant neoplasm of other parts of uterus: Secondary | ICD-10-CM | POA: Diagnosis not present

## 2015-12-16 DIAGNOSIS — Z79899 Other long term (current) drug therapy: Secondary | ICD-10-CM | POA: Diagnosis not present

## 2015-12-16 DIAGNOSIS — J45909 Unspecified asthma, uncomplicated: Secondary | ICD-10-CM | POA: Insufficient documentation

## 2015-12-16 DIAGNOSIS — S92424A Nondisplaced fracture of distal phalanx of right great toe, initial encounter for closed fracture: Secondary | ICD-10-CM | POA: Diagnosis not present

## 2015-12-16 DIAGNOSIS — Y92000 Kitchen of unspecified non-institutional (private) residence as  the place of occurrence of the external cause: Secondary | ICD-10-CM | POA: Insufficient documentation

## 2015-12-16 DIAGNOSIS — S92425A Nondisplaced fracture of distal phalanx of left great toe, initial encounter for closed fracture: Secondary | ICD-10-CM | POA: Diagnosis not present

## 2015-12-16 DIAGNOSIS — F329 Major depressive disorder, single episode, unspecified: Secondary | ICD-10-CM | POA: Insufficient documentation

## 2015-12-16 DIAGNOSIS — W228XXA Striking against or struck by other objects, initial encounter: Secondary | ICD-10-CM | POA: Insufficient documentation

## 2015-12-16 DIAGNOSIS — E785 Hyperlipidemia, unspecified: Secondary | ICD-10-CM | POA: Insufficient documentation

## 2015-12-16 DIAGNOSIS — Y999 Unspecified external cause status: Secondary | ICD-10-CM | POA: Diagnosis not present

## 2015-12-16 DIAGNOSIS — S92405A Nondisplaced unspecified fracture of left great toe, initial encounter for closed fracture: Secondary | ICD-10-CM

## 2015-12-16 IMAGING — CR DG CHEST 2V
2 series · 2 of 2 positions shown · non-contrast
Comparison: 09/08/2014

CLINICAL DATA: Productive cough, congestion.

EXAM:
CHEST  2 VIEW

[w chest pa]
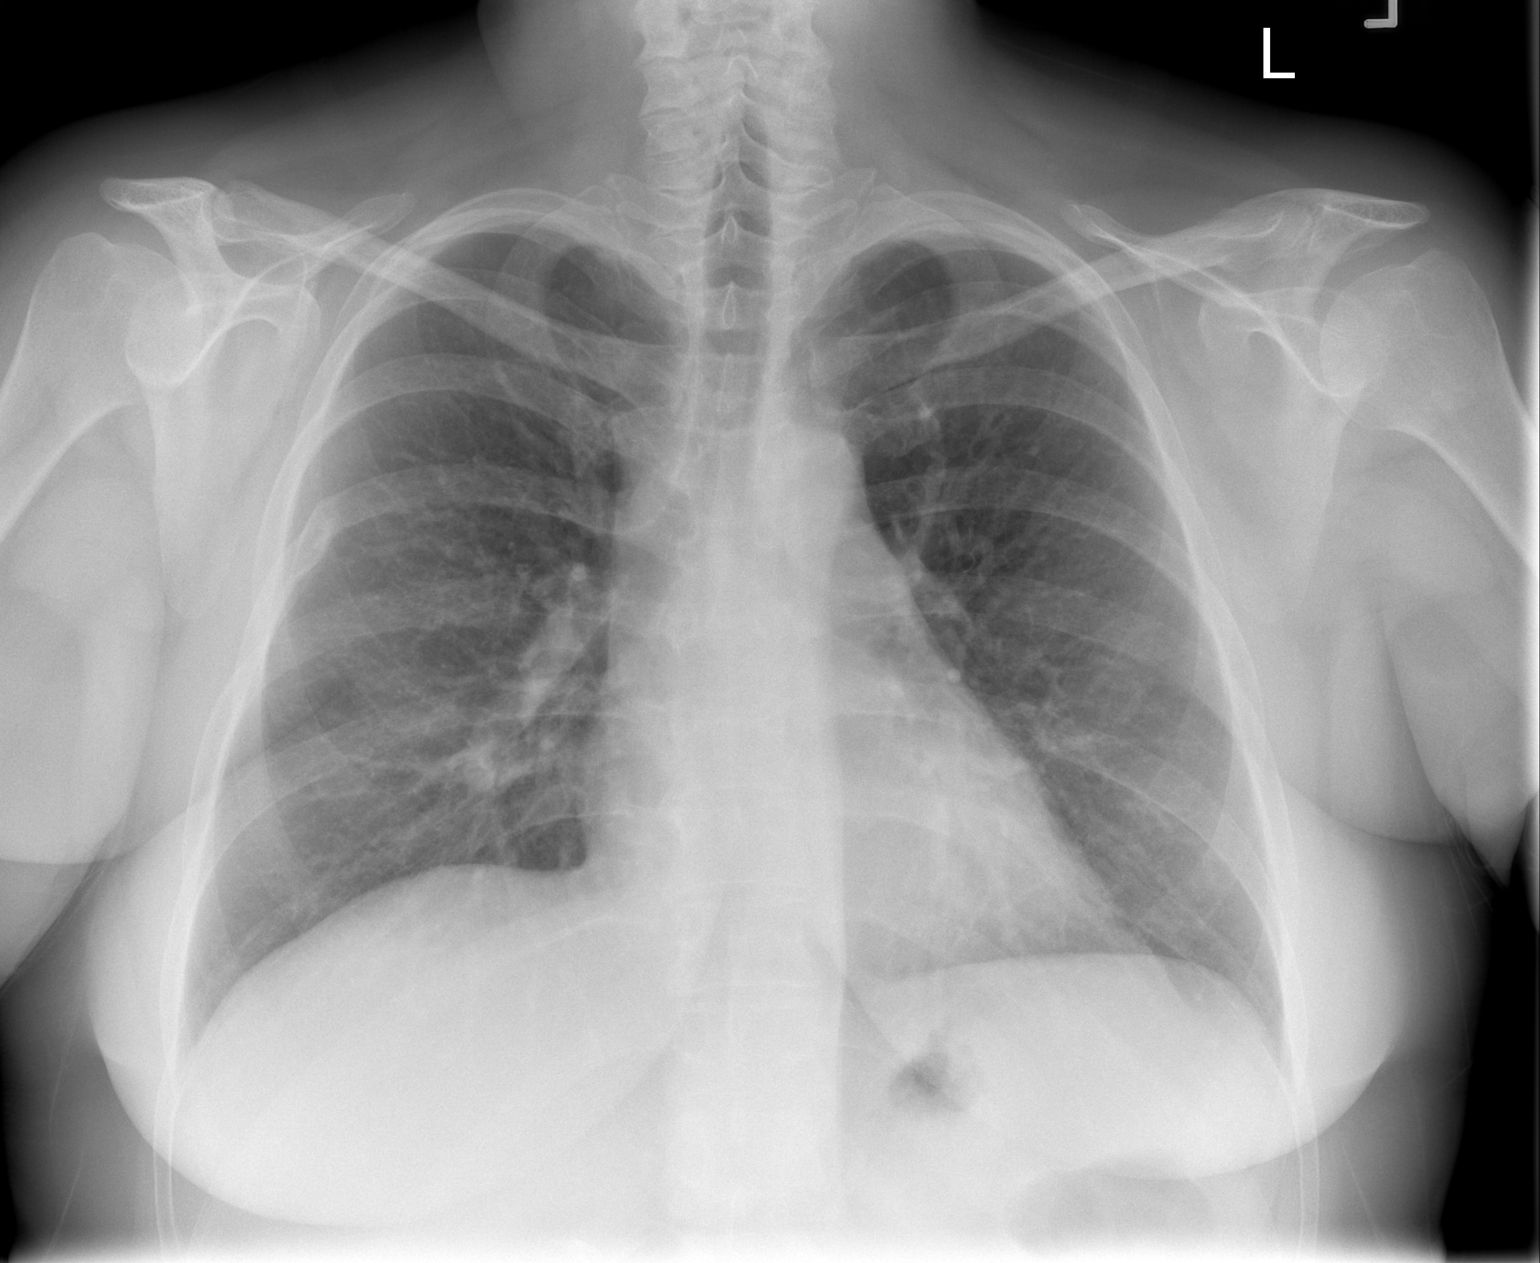

[w chest lat]
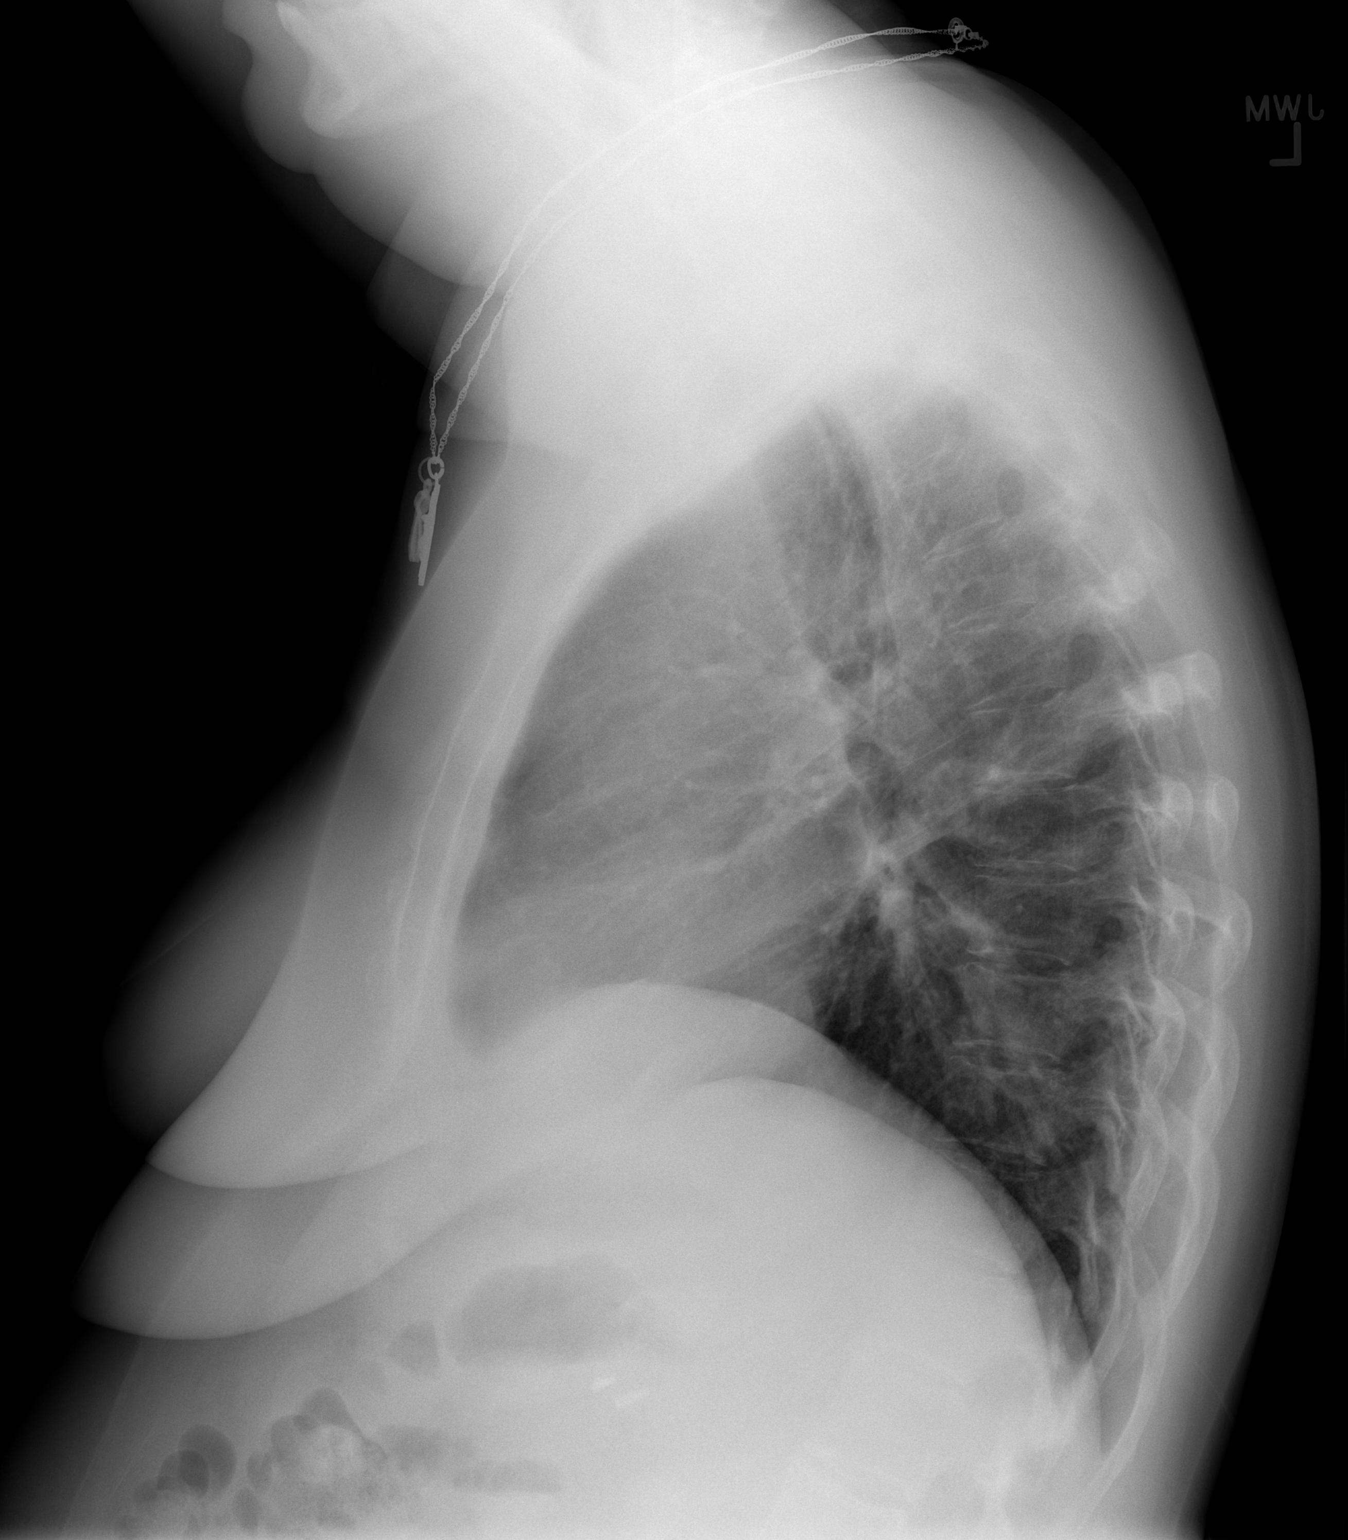

[2 of 2 positions shown; findings below may reference images not displayed]

FINDINGS: Heart and mediastinal contours are within normal limits. No focal
opacities or effusions. No acute bony abnormality. Old healed right
sixth rib fracture. No change.
IMPRESSION: No active cardiopulmonary disease.

## 2015-12-16 NOTE — ED Notes (Signed)
Right great toe pain. States she ran into something an hour ago.
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
## 2015-12-16 NOTE — Telephone Encounter (Signed)
Spoke with Jennifer [name] and she stated that she was seen in the ED and was told that she fractured her L great toe, she feels that the cam walker is to much and wanted to know if she actually has to wear this or can she just buddy tape her toes together and slide her foot into a shoe so that she is able to maneuver throughout the day.she was told that she will need to schedule a f/u appt with an ortho surgeon and would like to get either Dr. Gardiner Ramus or Dr. Mcneil Sober  opinion about how she should proceed. Will fwd to pcp for advice.[name]

## 2015-12-16 NOTE — ED Notes (Signed)
During D/c that patient in a rush to leave. Reports that she has no one to drive her home and that she will have to talk the cam walker off for driving. Reiterated to the patient that this is not advised, and the patient acknowledged the information, but reports that she has no choice. PAtient walking out at that time while talking.

## 2015-12-16 NOTE — Discharge Instructions (Signed)
You have a broken toe. Please do not bear weight on it until follow-up with orthopedic surgeon. Referral given to you above.   Toe Fracture A toe fracture is a break in one of the toe bones (phalanges). HOME CARE If You Have a Cast:  Do not stick anything inside the cast to scratch your skin.  Check the skin around the cast every day. Tell your doctor about any concerns. Do not put lotion on the skin underneath the cast. You may put lotion on dry skin around the edges of the cast.  Do not put pressure on any part of the cast until it is fully hardened. This may take many hours.  Keep the cast clean and dry. Bathing  Do not take baths, swim, or use a hot tub until your doctor says that you can. Ask your doctor if you can take showers. You may only be allowed to take sponge baths for bathing.  If your doctor says that bathing and showering are okay, cover the cast or bandage (dressing) with a watertight plastic bag to protect it from water. Do not let the cast or bandage get wet. Managing Pain, Stiffness, and Swelling  If you do not have a cast, put ice on the injured area if told by your doctor:  Put ice in a plastic bag.  Place a towel between your skin and the bag.  Leave the ice on for 20 minutes, 2-3 times per day.  Move your toes often to avoid stiffness and to lessen swelling.  Raise (elevate) the injured area above the level of your heart while you are sitting or lying down. Driving  Do not drive or use heavy machinery while taking pain medicine.  Do not drive while wearing a cast on a foot that you use for driving. Activity  Return to your normal activities as told by your doctor. Ask your doctor what activities are safe for you.  Perform exercises daily as told by your doctor or therapist. Safety  Do not use your leg to support your body weight until your doctor says that you can. Use crutches or other tools to help you move around as told by your doctor. General  Instructions  If your toe was taped to a toe that is next to it (buddy taping), follow your doctor's instructions for changing the gauze and tape. Change it more often:  If the gauze and tape get wet. If this happens, dry the space between the toes.  If the gauze and tape are too tight and they cause your toe to become pale or to lose feeling (numb).  Wear a protective shoe as told by your doctor. If you were not given one, wear sturdy shoes that support your foot. Your shoes should not pinch your toes. Your shoes should not fit tightly against your toes.  Do not use any tobacco products, including cigarettes, chewing tobacco, or e-cigarettes. Tobacco can delay bone healing. If you need help quitting, ask your doctor.  Take medicines only as told by your doctor.  Keep all follow-up visits as told by your doctor. This is important. GET HELP IF:  You have a fever.  Your pain medicine is not helping.  Your toe feels cold.  You lose feeling (have numbness) in your toe.  You still have pain after one week of rest and treatment.  You still have pain after your doctor has said that you can start walking again.  You have pain or tingling in  your foot, and it is not going away.  You have loss of feeling in your foot, and it is not going away. GET HELP RIGHT AWAY IF:  You have severe pain.  You have redness or swelling (inflammation) in your toe, and it is getting worse.  You have pain or loss of feeling in your toe, and it is getting worse.  Your toe is blue.   This information is not intended to replace advice given to you by your health care provider. Make sure you discuss any questions you have with your health care provider.   Document Released: 01/17/2008 Document Revised: 12/15/2014 Document Reviewed: 05/27/2014 Elsevier Interactive Patient Education [employer].

## 2015-12-17 ENCOUNTER — Encounter: Payer: Self-pay | Admitting: Family Medicine

## 2015-12-17 ENCOUNTER — Ambulatory Visit (INDEPENDENT_AMBULATORY_CARE_PROVIDER_SITE_OTHER): Payer: Medicare Other | Admitting: Family Medicine

## 2015-12-17 VITALS — BP 133/83 | HR 89 | Ht 62.0 in | Wt 198.0 lb

## 2015-12-17 DIAGNOSIS — S92911A Unspecified fracture of right toe(s), initial encounter for closed fracture: Secondary | ICD-10-CM | POA: Insufficient documentation
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
## 2015-12-17 NOTE — Telephone Encounter (Signed)
Called and lvm informing her of recommendations.Jennifer [name]

## 2015-12-17 NOTE — Patient Instructions (Signed)
You have a distal phalanx fracture of your great toe. Wear postop shoe or cam boot when up and walking around. Ice area 15 minutes at a time 3-4 times a day. Elevate above your heart as much as possible for the swelling. Tylenol 500mg  1-2 tabs three times a day. If you can take anti-inflammatories can try aleve 2 tabs twice a day with food OR ibuprofen 600mg  three times a day with food. Let me know if you want to try hydrocodone or oxycodone for pain. Follow up with me in 2 weeks - we will repeat your x-rays. Expect 4-6 weeks for this to be completely healed.

## 2015-12-17 NOTE — Assessment & Plan Note (Signed)
Right great toe distal phalanx fracture - independently reviewed radiographs - this is small, nondisplaced but is intraarticular.  She should do well with conservative treatment.  Does not tolerate buddy taping due to pain.  Icing, elevation, tylenol.  Switched to a short cam walker which was more comfortable.  F/u in 2 weeks - will repeat radiographs.

## 2015-12-17 NOTE — Telephone Encounter (Signed)
Since the fracture does involve the articular surface even though it is nondisplaced I think she really does need to use the cam walker. Unfortunately on either heavy and akward. But I think she needs to use it for at least the next week and then follow-up with Dr. Darene Lamer next week to have him check the toe and see if it's healing. May be at that point she could use a postop shoe instead.

## 2015-12-18 DIAGNOSIS — Z79899 Other long term (current) drug therapy: Secondary | ICD-10-CM | POA: Diagnosis not present

## 2015-12-18 DIAGNOSIS — R1031 Right lower quadrant pain: Secondary | ICD-10-CM | POA: Diagnosis not present

## 2015-12-18 DIAGNOSIS — G4733 Obstructive sleep apnea (adult) (pediatric): Secondary | ICD-10-CM | POA: Diagnosis not present

## 2015-12-18 DIAGNOSIS — I10 Essential (primary) hypertension: Secondary | ICD-10-CM | POA: Diagnosis not present

## 2015-12-18 DIAGNOSIS — Z9861 Coronary angioplasty status: Secondary | ICD-10-CM | POA: Diagnosis not present

## 2015-12-18 DIAGNOSIS — G35 Multiple sclerosis: Secondary | ICD-10-CM | POA: Diagnosis not present

## 2015-12-18 DIAGNOSIS — J45909 Unspecified asthma, uncomplicated: Secondary | ICD-10-CM | POA: Diagnosis not present

## 2015-12-18 DIAGNOSIS — S92404D Nondisplaced unspecified fracture of right great toe, subsequent encounter for fracture with routine healing: Secondary | ICD-10-CM | POA: Diagnosis not present

## 2015-12-21 DIAGNOSIS — I10 Essential (primary) hypertension: Secondary | ICD-10-CM | POA: Diagnosis not present

## 2015-12-21 DIAGNOSIS — G35 Multiple sclerosis: Secondary | ICD-10-CM | POA: Diagnosis not present

## 2015-12-21 DIAGNOSIS — K589 Irritable bowel syndrome without diarrhea: Secondary | ICD-10-CM | POA: Diagnosis not present

## 2015-12-21 DIAGNOSIS — J45909 Unspecified asthma, uncomplicated: Secondary | ICD-10-CM | POA: Diagnosis not present

## 2015-12-21 DIAGNOSIS — Z87891 Personal history of nicotine dependence: Secondary | ICD-10-CM | POA: Diagnosis not present

## 2015-12-21 DIAGNOSIS — K219 Gastro-esophageal reflux disease without esophagitis: Secondary | ICD-10-CM | POA: Diagnosis not present

## 2015-12-21 DIAGNOSIS — R609 Edema, unspecified: Secondary | ICD-10-CM | POA: Diagnosis not present

## 2015-12-21 DIAGNOSIS — R220 Localized swelling, mass and lump, head: Secondary | ICD-10-CM | POA: Diagnosis not present

## 2015-12-22 ENCOUNTER — Emergency Department (HOSPITAL_BASED_OUTPATIENT_CLINIC_OR_DEPARTMENT_OTHER): Payer: Medicare Other

## 2015-12-22 ENCOUNTER — Encounter (HOSPITAL_BASED_OUTPATIENT_CLINIC_OR_DEPARTMENT_OTHER): Payer: Self-pay | Admitting: *Deleted

## 2015-12-22 ENCOUNTER — Emergency Department (HOSPITAL_BASED_OUTPATIENT_CLINIC_OR_DEPARTMENT_OTHER)
Admission: EM | Admit: 2015-12-22 | Discharge: 2015-12-22 | Disposition: A | Discharge status: Home / Self Care | Payer: Medicare Other | Attending: Emergency Medicine | Admitting: Emergency Medicine

## 2015-12-22 DIAGNOSIS — Y939 Activity, unspecified: Secondary | ICD-10-CM | POA: Diagnosis not present

## 2015-12-22 DIAGNOSIS — Y929 Unspecified place or not applicable: Secondary | ICD-10-CM | POA: Insufficient documentation

## 2015-12-22 DIAGNOSIS — Z8542 Personal history of malignant neoplasm of other parts of uterus: Secondary | ICD-10-CM | POA: Diagnosis not present

## 2015-12-22 DIAGNOSIS — Y999 Unspecified external cause status: Secondary | ICD-10-CM | POA: Diagnosis not present

## 2015-12-22 DIAGNOSIS — F329 Major depressive disorder, single episode, unspecified: Secondary | ICD-10-CM | POA: Insufficient documentation

## 2015-12-22 DIAGNOSIS — R2689 Other abnormalities of gait and mobility: Secondary | ICD-10-CM | POA: Insufficient documentation

## 2015-12-22 DIAGNOSIS — J45909 Unspecified asthma, uncomplicated: Secondary | ICD-10-CM | POA: Insufficient documentation

## 2015-12-22 DIAGNOSIS — E785 Hyperlipidemia, unspecified: Secondary | ICD-10-CM | POA: Insufficient documentation

## 2015-12-22 DIAGNOSIS — I10 Essential (primary) hypertension: Secondary | ICD-10-CM | POA: Insufficient documentation

## 2015-12-22 DIAGNOSIS — R200 Anesthesia of skin: Secondary | ICD-10-CM | POA: Diagnosis not present

## 2015-12-22 DIAGNOSIS — H538 Other visual disturbances: Secondary | ICD-10-CM | POA: Insufficient documentation

## 2015-12-22 DIAGNOSIS — M79674 Pain in right toe(s): Secondary | ICD-10-CM | POA: Diagnosis present

## 2015-12-22 DIAGNOSIS — Z79899 Other long term (current) drug therapy: Secondary | ICD-10-CM | POA: Diagnosis not present

## 2015-12-22 DIAGNOSIS — Z87891 Personal history of nicotine dependence: Secondary | ICD-10-CM | POA: Insufficient documentation

## 2015-12-22 DIAGNOSIS — X58XXXD Exposure to other specified factors, subsequent encounter: Secondary | ICD-10-CM | POA: Insufficient documentation

## 2015-12-22 DIAGNOSIS — R52 Pain, unspecified: Secondary | ICD-10-CM

## 2015-12-22 DIAGNOSIS — S92919A Unspecified fracture of unspecified toe(s), initial encounter for closed fracture: Secondary | ICD-10-CM

## 2015-12-22 DIAGNOSIS — R42 Dizziness and giddiness: Secondary | ICD-10-CM | POA: Diagnosis not present

## 2015-12-22 DIAGNOSIS — G35 Multiple sclerosis: Secondary | ICD-10-CM | POA: Diagnosis not present

## 2015-12-22 DIAGNOSIS — S92911D Unspecified fracture of right toe(s), subsequent encounter for fracture with routine healing: Secondary | ICD-10-CM

## 2015-12-22 DIAGNOSIS — S92421D Displaced fracture of distal phalanx of right great toe, subsequent encounter for fracture with routine healing: Secondary | ICD-10-CM | POA: Insufficient documentation

## 2015-12-22 DIAGNOSIS — M79671 Pain in right foot: Secondary | ICD-10-CM | POA: Diagnosis not present

## 2015-12-22 NOTE — ED Notes (Signed)
Pt reports increased pain to her right foot, pt states she has been removing her boot to drive and wonders if she has injured it again.
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
## 2015-12-22 NOTE — Discharge Instructions (Signed)
Please read and follow all provided instructions.  Your diagnoses today include:  1. Toe fracture, left, with routine healing, subsequent encounter   2. Toe fracture   3. Pain    Tests performed today include:  An x-ray of the affected area - shows toe fracture, no new fractures  Vital signs. See below for your results today.   Medications prescribed:   None  Take any prescribed medications only as directed.  Home care instructions:   Follow any educational materials contained in this packet  Follow R.I.C.E. Protocol:  R - rest your injury   I  - use ice on injury without applying directly to skin  C - compress injury with bandage or splint  E - elevate the injury as much as possible  Follow-up instructions: Please follow-up with your primary care provider or your orthopedic physician (bone specialist) if you continue to have significant pain.   Return instructions:   Please return if your toes or feet are numb or tingling, appear gray or blue, or you have severe pain (also elevate the leg and loosen splint or wrap if you were given one)  Please return to the Emergency Department if you experience worsening symptoms.   Please return if you have any other emergent concerns.  Additional Information:  Your vital signs today were: BP 165/97 mmHg   Pulse 102   Temp(Src) 98.4 F (36.9 C) (Oral)   Resp 18   Ht 5\' 2"  (1.575 m)   Wt 89.812 kg   BMI 36.21 kg/m2   SpO2 99%   LMP 06/25/2013 If your blood pressure (BP) was elevated above 135/85 this visit, please have this repeated by your doctor within one month. -------------- If prescribed crutches for your injury: use crutches with non-weight bearing for the first few days. Then, you may walk as the pain allows, or as instructed. Start gradually with weight bearing on the affected side. Once you can walk pain free, then try jogging. When you can run forwards, then you can try moving side-to-side. If you cannot walk without  crutches in one week, you need a re-check. --------------

## 2015-12-23 DIAGNOSIS — Z79899 Other long term (current) drug therapy: Secondary | ICD-10-CM | POA: Diagnosis not present

## 2015-12-23 DIAGNOSIS — Z883 Allergy status to other anti-infective agents status: Secondary | ICD-10-CM | POA: Diagnosis not present

## 2015-12-23 DIAGNOSIS — J45901 Unspecified asthma with (acute) exacerbation: Secondary | ICD-10-CM | POA: Diagnosis not present

## 2015-12-23 DIAGNOSIS — G43009 Migraine without aura, not intractable, without status migrainosus: Secondary | ICD-10-CM | POA: Diagnosis not present

## 2015-12-23 DIAGNOSIS — K1230 Oral mucositis (ulcerative), unspecified: Secondary | ICD-10-CM | POA: Diagnosis not present

## 2015-12-23 DIAGNOSIS — G4733 Obstructive sleep apnea (adult) (pediatric): Secondary | ICD-10-CM | POA: Diagnosis not present

## 2015-12-23 DIAGNOSIS — Z87891 Personal history of nicotine dependence: Secondary | ICD-10-CM | POA: Diagnosis not present

## 2015-12-23 DIAGNOSIS — R569 Unspecified convulsions: Secondary | ICD-10-CM | POA: Diagnosis not present

## 2015-12-23 DIAGNOSIS — R1314 Dysphagia, pharyngoesophageal phase: Secondary | ICD-10-CM | POA: Diagnosis not present

## 2015-12-23 DIAGNOSIS — E2609 Other primary hyperaldosteronism: Secondary | ICD-10-CM | POA: Diagnosis not present

## 2015-12-23 DIAGNOSIS — Z881 Allergy status to other antibiotic agents status: Secondary | ICD-10-CM | POA: Diagnosis not present

## 2015-12-23 DIAGNOSIS — G8929 Other chronic pain: Secondary | ICD-10-CM | POA: Diagnosis not present

## 2015-12-23 DIAGNOSIS — R0602 Shortness of breath: Secondary | ICD-10-CM | POA: Diagnosis not present

## 2015-12-23 DIAGNOSIS — J4520 Mild intermittent asthma, uncomplicated: Secondary | ICD-10-CM | POA: Diagnosis not present

## 2015-12-23 DIAGNOSIS — E785 Hyperlipidemia, unspecified: Secondary | ICD-10-CM | POA: Diagnosis not present

## 2015-12-23 DIAGNOSIS — Z882 Allergy status to sulfonamides status: Secondary | ICD-10-CM | POA: Diagnosis not present

## 2015-12-23 DIAGNOSIS — Z7951 Long term (current) use of inhaled steroids: Secondary | ICD-10-CM | POA: Diagnosis not present

## 2015-12-23 DIAGNOSIS — K219 Gastro-esophageal reflux disease without esophagitis: Secondary | ICD-10-CM | POA: Diagnosis not present

## 2015-12-23 DIAGNOSIS — Z880 Allergy status to penicillin: Secondary | ICD-10-CM | POA: Diagnosis not present

## 2015-12-23 DIAGNOSIS — Z885 Allergy status to narcotic agent status: Secondary | ICD-10-CM | POA: Diagnosis not present

## 2015-12-23 DIAGNOSIS — I10 Essential (primary) hypertension: Secondary | ICD-10-CM | POA: Diagnosis not present

## 2015-12-23 DIAGNOSIS — Z888 Allergy status to other drugs, medicaments and biological substances status: Secondary | ICD-10-CM | POA: Diagnosis not present

## 2015-12-23 DIAGNOSIS — M549 Dorsalgia, unspecified: Secondary | ICD-10-CM | POA: Diagnosis not present

## 2015-12-23 DIAGNOSIS — Z91018 Allergy to other foods: Secondary | ICD-10-CM | POA: Diagnosis not present

## 2015-12-23 DIAGNOSIS — Z791 Long term (current) use of non-steroidal anti-inflammatories (NSAID): Secondary | ICD-10-CM | POA: Diagnosis not present

## 2015-12-23 IMAGING — DX DG CHEST 2V
2 series · 2 of 2 positions shown · non-contrast
Comparison: PA and lateral chest 09/22/2014 and 08/06/2014. CT
chest 04/22/2014.

CLINICAL DATA: Cough, shortness of breath and abdominal pain for 2
days.

EXAM:
CHEST  2 VIEW

[chest pa]
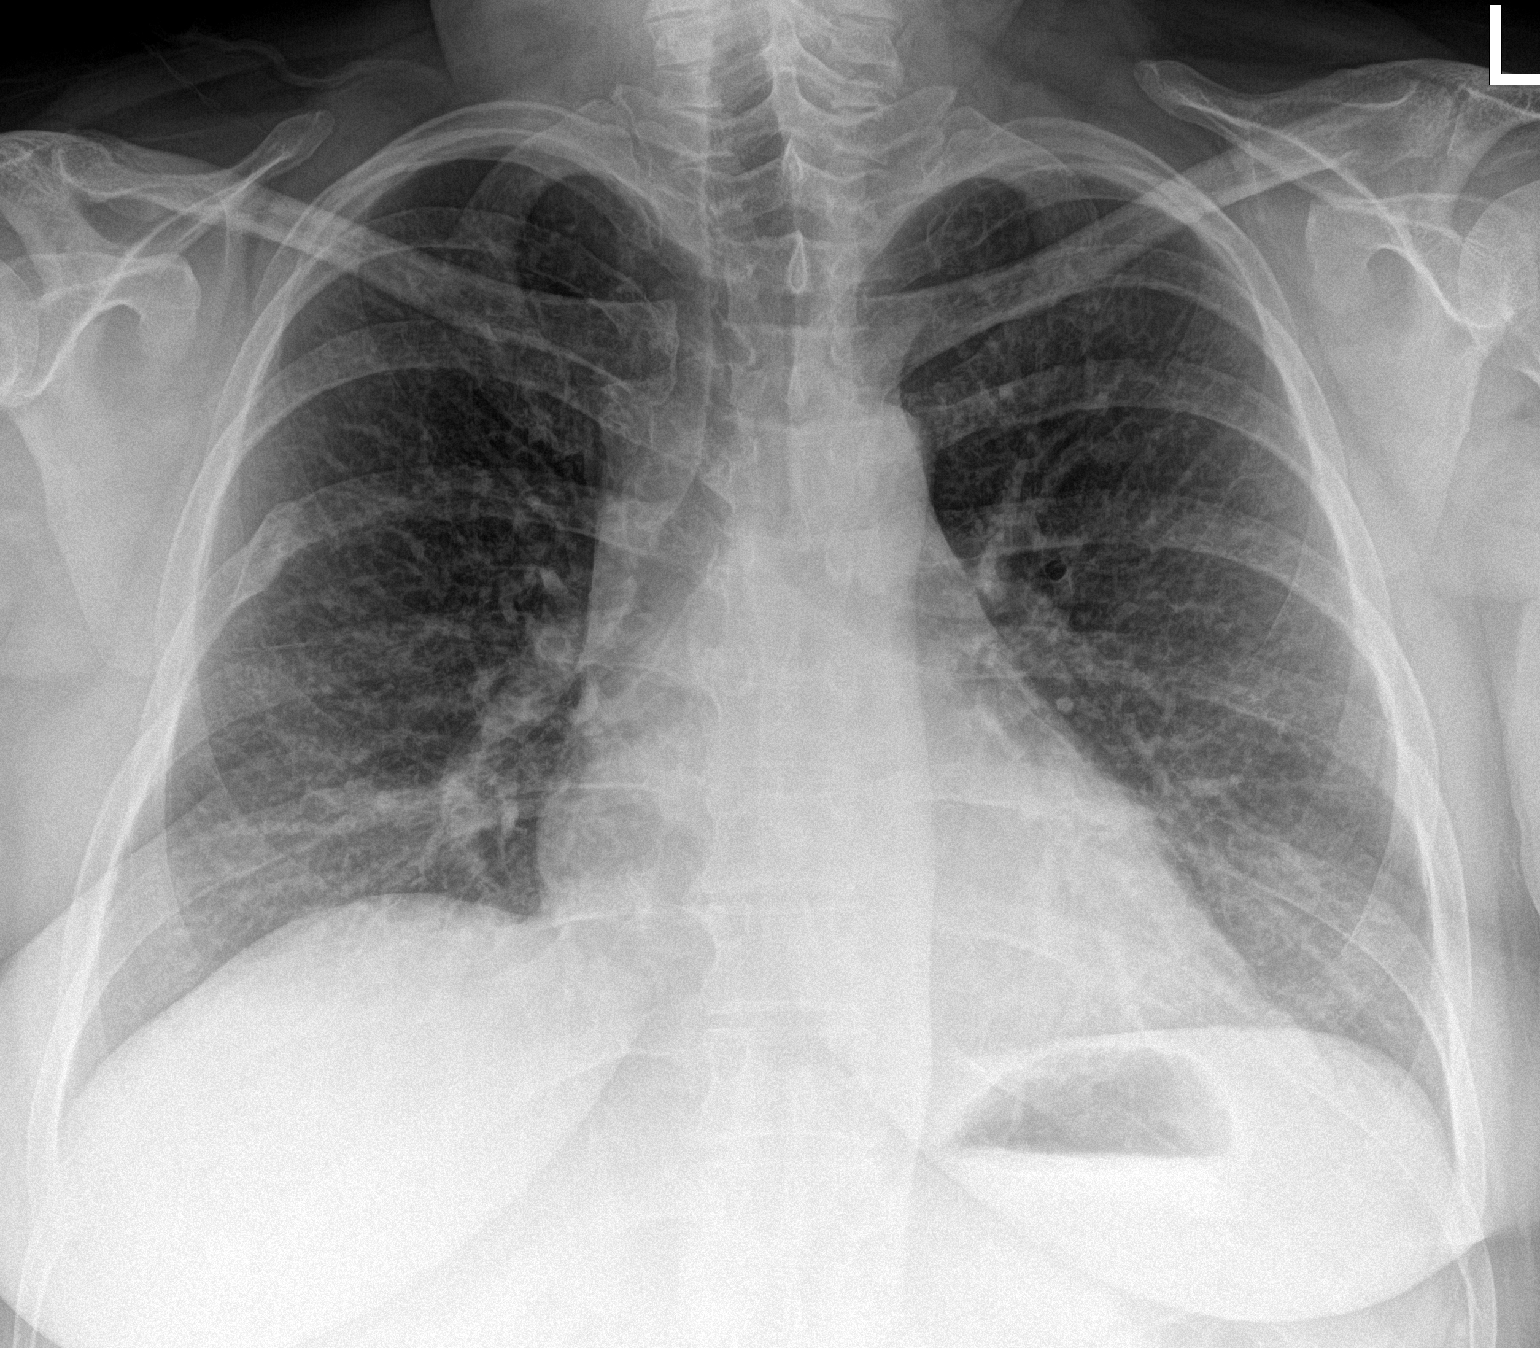

[chest lat]
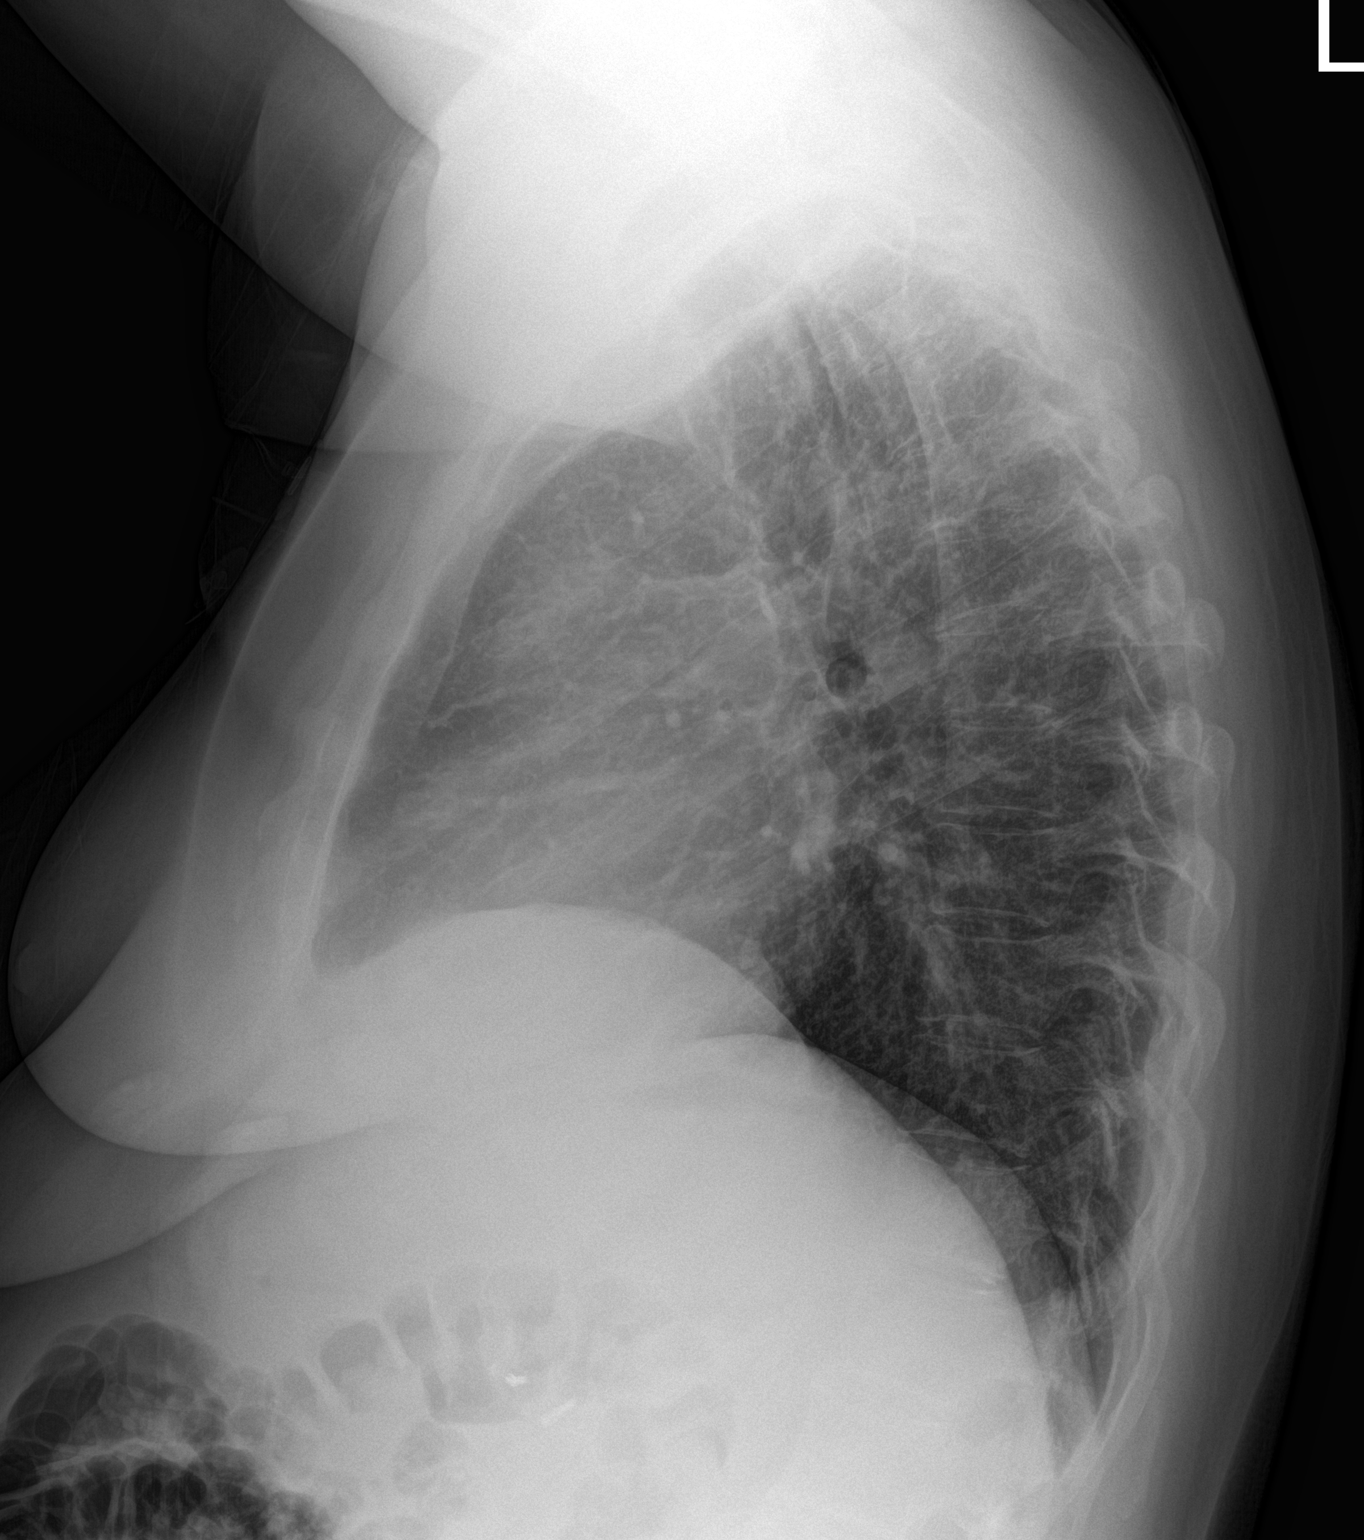

[2 of 2 positions shown; findings below may reference images not displayed]

FINDINGS: The lungs are clear. Heart size is upper normal. No pneumothorax or
pleural effusion. Remote right sixth rib fracture is again seen.
IMPRESSION: No acute disease.

## 2015-12-24 ENCOUNTER — Encounter: Payer: Self-pay | Admitting: Family Medicine

## 2015-12-27 ENCOUNTER — Telehealth: Payer: Self-pay | Admitting: Family Medicine

## 2015-12-27 NOTE — Telephone Encounter (Signed)
Pt states she was able to get the Bystolic Rx, but has not started taking it yet. She is taking her Amlodipine 2.5mg  at night but has weaned off Labetalol. Pt states her BP has been running high (170/90's) and has headache. Pt does state her pulse is in the 70's. Pt questions if she should begin the Bystolic and if she should be taking it in the am or pm. Will route to PCP for review.

## 2015-12-27 NOTE — Telephone Encounter (Signed)
Spoke with PCP regarding Rx changes. Received verbal for Pt to begin Bystolic in am, continue same dose Amlodipine at night. Pt states since its "so late in the day" she will "start the Bystolic tomorrow morning." Pt reminded of her upcoming follow up appointment next week. No further questions/concerns at this time.

## 2015-12-28 DIAGNOSIS — G8929 Other chronic pain: Secondary | ICD-10-CM | POA: Diagnosis not present

## 2015-12-28 DIAGNOSIS — G4733 Obstructive sleep apnea (adult) (pediatric): Secondary | ICD-10-CM | POA: Diagnosis not present

## 2015-12-28 DIAGNOSIS — D649 Anemia, unspecified: Secondary | ICD-10-CM | POA: Diagnosis not present

## 2015-12-28 DIAGNOSIS — Z886 Allergy status to analgesic agent status: Secondary | ICD-10-CM | POA: Diagnosis not present

## 2015-12-28 DIAGNOSIS — G35 Multiple sclerosis: Secondary | ICD-10-CM | POA: Diagnosis not present

## 2015-12-28 DIAGNOSIS — Z711 Person with feared health complaint in whom no diagnosis is made: Secondary | ICD-10-CM | POA: Diagnosis not present

## 2015-12-28 DIAGNOSIS — I10 Essential (primary) hypertension: Secondary | ICD-10-CM | POA: Diagnosis not present

## 2015-12-28 DIAGNOSIS — Z885 Allergy status to narcotic agent status: Secondary | ICD-10-CM | POA: Diagnosis not present

## 2015-12-28 DIAGNOSIS — J45909 Unspecified asthma, uncomplicated: Secondary | ICD-10-CM | POA: Diagnosis not present

## 2015-12-28 DIAGNOSIS — Z888 Allergy status to other drugs, medicaments and biological substances status: Secondary | ICD-10-CM | POA: Diagnosis not present

## 2015-12-28 DIAGNOSIS — Z87891 Personal history of nicotine dependence: Secondary | ICD-10-CM | POA: Diagnosis not present

## 2015-12-28 LAB — BASIC METABOLIC PANEL
BUN: 12 mg/dL (ref 4–21)
Creatinine: 0.7 mg/dL (ref 0.5–1.1)
Glucose: 98 mg/dL
Potassium: 3.9 mmol/L (ref 3.4–5.3)
Sodium: 142 mmol/L (ref 137–147)

## 2015-12-28 LAB — CBC AND DIFFERENTIAL
HCT: 44 % (ref 36–46)
Hemoglobin: 14.7 g/dL (ref 12.0–16.0)
Platelets: 193 10*3/uL (ref 150–399)
WBC: 7.8 10^3/mL

## 2015-12-30 ENCOUNTER — Telehealth: Payer: Self-pay | Admitting: *Deleted

## 2015-12-30 NOTE — Telephone Encounter (Signed)
Error

## 2015-12-31 ENCOUNTER — Emergency Department (HOSPITAL_BASED_OUTPATIENT_CLINIC_OR_DEPARTMENT_OTHER)
Admission: EM | Admit: 2015-12-31 | Discharge: 2015-12-31 | Disposition: A | Discharge status: Home / Self Care | Payer: Medicare Other | Attending: Emergency Medicine | Admitting: Emergency Medicine

## 2015-12-31 ENCOUNTER — Encounter (HOSPITAL_BASED_OUTPATIENT_CLINIC_OR_DEPARTMENT_OTHER): Payer: Self-pay | Admitting: *Deleted

## 2015-12-31 ENCOUNTER — Ambulatory Visit: Payer: Self-pay | Admitting: Family Medicine

## 2015-12-31 DIAGNOSIS — Z79899 Other long term (current) drug therapy: Secondary | ICD-10-CM | POA: Diagnosis not present

## 2015-12-31 DIAGNOSIS — Z87891 Personal history of nicotine dependence: Secondary | ICD-10-CM | POA: Insufficient documentation

## 2015-12-31 DIAGNOSIS — I10 Essential (primary) hypertension: Secondary | ICD-10-CM | POA: Diagnosis not present

## 2015-12-31 DIAGNOSIS — M7989 Other specified soft tissue disorders: Secondary | ICD-10-CM | POA: Diagnosis present

## 2015-12-31 DIAGNOSIS — M25511 Pain in right shoulder: Secondary | ICD-10-CM | POA: Diagnosis not present

## 2015-12-31 DIAGNOSIS — E785 Hyperlipidemia, unspecified: Secondary | ICD-10-CM | POA: Insufficient documentation

## 2015-12-31 DIAGNOSIS — J45909 Unspecified asthma, uncomplicated: Secondary | ICD-10-CM | POA: Insufficient documentation

## 2015-12-31 LAB — CBC WITH DIFFERENTIAL/PLATELET
Basophils Absolute: 0 10*3/uL (ref 0.0–0.1)
Basophils Relative: 1 %
Eosinophils Absolute: 0.2 10*3/uL (ref 0.0–0.7)
Eosinophils Relative: 2 %
HCT: 43.7 % (ref 36.0–46.0)
Hemoglobin: 14.6 g/dL (ref 12.0–15.0)
Lymphocytes Relative: 19 %
Lymphs Abs: 1.7 10*3/uL (ref 0.7–4.0)
MCH: 30.9 pg (ref 26.0–34.0)
MCHC: 33.4 g/dL (ref 30.0–36.0)
MCV: 92.4 fL (ref 78.0–100.0)
Monocytes Absolute: 0.7 10*3/uL (ref 0.1–1.0)
Monocytes Relative: 8 %
Neutro Abs: 6 10*3/uL (ref 1.7–7.7)
Neutrophils Relative %: 70 %
Platelets: 204 10*3/uL (ref 150–400)
RBC: 4.73 MIL/uL (ref 3.87–5.11)
RDW: 13.1 % (ref 11.5–15.5)
WBC: 8.6 10*3/uL (ref 4.0–10.5)

## 2015-12-31 LAB — BASIC METABOLIC PANEL
Anion gap: 6 (ref 5–15)
BUN: 17 mg/dL (ref 6–20)
CO2: 24 mmol/L (ref 22–32)
Calcium: 8.7 mg/dL — ABNORMAL LOW (ref 8.9–10.3)
Chloride: 108 mmol/L (ref 101–111)
Creatinine, Ser: 0.48 mg/dL (ref 0.44–1.00)
GFR calc Af Amer: >60 mL/min (ref >60)
GFR calc non Af Amer: >60 mL/min (ref >60)
Glucose, Bld: 103 mg/dL — ABNORMAL HIGH (ref 65–99)
Potassium: 3.5 mmol/L (ref 3.5–5.1)
Sodium: 138 mmol/L (ref 135–145)

## 2015-12-31 NOTE — Discharge Instructions (Signed)
Take ibuprofen every 4-6 hours as needed for your pain. Use heat 3-4 times daily alternating 20 minutes on, 20 minutes off. Do stretching exercises that we discussed as tolerated. You may try the shoulder range of motion exercises as indicated below as well. Please follow-up with Dr. Trevor Mace office for further evaluation and treatment of your symptoms. Please follow-up with your primary care provider for further evaluation and treatment of your blood pressure. Please return to emergency department if you develop any new or worsening symptoms.   Shoulder Pain The shoulder is the joint that connects your arms to your body. The bones that form the shoulder joint include the upper arm bone (humerus), the shoulder blade (scapula), and the collarbone (clavicle). The top of the humerus is shaped like a ball and fits into a rather flat socket on the scapula (glenoid cavity). A combination of muscles and strong, fibrous tissues that connect muscles to bones (tendons) support your shoulder joint and hold the ball in the socket. Small, fluid-filled sacs (bursae) are located in different areas of the joint. They act as cushions between the bones and the overlying soft tissues and help reduce friction between the gliding tendons and the bone as you move your arm. Your shoulder joint allows a wide range of motion in your arm. This range of motion allows you to do things like scratch your back or throw a ball. However, this range of motion also makes your shoulder more prone to pain from overuse and injury. Causes of shoulder pain can originate from both injury and overuse and usually can be grouped in the following four categories: 1. Redness, swelling, and pain (inflammation) of the tendon (tendinitis) or the bursae (bursitis). 2. Instability, such as a dislocation of the joint. 3. Inflammation of the joint (arthritis). 4. Broken bone (fracture). HOME CARE INSTRUCTIONS  1. Apply ice to the sore area.  Put ice in  a plastic bag.  Place a towel between your skin and the bag.  Leave the ice on for 15-20 minutes, 3-4 times per day for the first 2 days, or as directed by your health care provider. 2. Stop using cold packs if they do not help with the pain. 3. If you have a shoulder sling or immobilizer, wear it as long as your caregiver instructs. Only remove it to shower or bathe. Move your arm as little as possible, but keep your hand moving to prevent swelling. 4. Squeeze a soft ball or foam pad as much as possible to help prevent swelling. 5. Only take over-the-counter or prescription medicines for pain, discomfort, or fever as directed by your caregiver. SEEK MEDICAL CARE IF:  1. Your shoulder pain increases, or new pain develops in your arm, hand, or fingers. 2. Your hand or fingers become cold and numb. 3. Your pain is not relieved with medicines. SEEK IMMEDIATE MEDICAL CARE IF:  1. Your arm, hand, or fingers are numb or tingling. 2. Your arm, hand, or fingers are significantly swollen or turn white or blue. MAKE SURE YOU:  1. Understand these instructions. 2. Will watch your condition. 3. Will get help right away if you are not doing well or get worse.   This information is not intended to replace advice given to you by your health care provider. Make sure you discuss any questions you have with your health care provider.   Document Released: 05/10/2005 Document Revised: 08/21/2014 Document Reviewed: 11/23/2014 Elsevier Interactive Patient Education 2016 [employer].  Shoulder Range of Motion Exercises  Shoulder range of motion (ROM) exercises are designed to keep the shoulder moving freely. They are often recommended for people who have shoulder pain. MOVEMENT EXERCISE When you are able, do this exercise 5-6 days per week, or as told by your health care provider. Work toward doing 2 sets of 10 swings. Pendulum Exercise How To Do This Exercise Lying Down 5. Lie face-down on a bed with your  abdomen close to the side of the bed. 6. Let your arm hang over the side of the bed. 7. Relax your shoulder, arm, and hand. 8. Slowly and gently swing your arm forward and back. Do not use your neck muscles to swing your arm. They should be relaxed. If you are struggling to swing your arm, have someone gently swing it for you. When you do this exercise for the first time, swing your arm at a 15 degree angle for 15 seconds, or swing your arm 10 times. As pain lessens over time, increase the angle of the swing to 30-45 degrees. 9. Repeat steps 1-4 with the other arm. How To Do This Exercise While Standing 6. Stand next to a sturdy chair or table and hold on to it with your hand.  Bend forward at the waist.  Bend your knees slightly.  Relax your other arm and let it hang limp.  Relax the shoulder blade of the arm that is hanging and let it drop.  While keeping your shoulder relaxed, use body motion to swing your arm in small circles. The first time you do this exercise, swing your arm for about 30 seconds or 10 times. When you do it next time, swing your arm for a little longer.  Stand up tall and relax.  Repeat steps 1-7, this time changing the direction of the circles. 7. Repeat steps 1-8 with the other arm. STRETCHING EXERCISES Do these exercises 3-4 times per day on 5-6 days per week or as told by your health care provider. Work toward holding the stretch for 20 seconds. Stretching Exercise 1 4. Lift your arm straight out in front of you. 5. Bend your arm 90 degrees at the elbow (right angle) so your forearm goes across your body and looks like the letter "L." 6. Use your other arm to gently pull the elbow forward and across your body. 7. Repeat steps 1-3 with the other arm. Stretching Exercise 2 You will need a towel or rope for this exercise. 3. Bend one arm behind your back with the palm facing outward. 4. Hold a towel with your other hand. 5. Reach the arm that holds the towel  above your head, and bend that arm at the elbow. Your wrist should be behind your neck. 6. Use your free hand to grab the free end of the towel. 7. With the higher hand, gently pull the towel up behind you. 8. With the lower hand, pull the towel down behind you. 9. Repeat steps 1-6 with the other arm. STRENGTHENING EXERCISES Do each of these exercises at four different times of day (sessions) every day or as told by your health care provider. To begin with, repeat each exercise 5 times (repetitions). Work toward doing 3 sets of 12 repetitions or as told by your health care provider. Strengthening Exercise 1 You will need a light weight for this activity. As you grow stronger, you may use a heavier weight. 4. Standing with a weight in your hand, lift your arm straight out to the side until it is at  the same height as your shoulder. 5. Bend your arm at 90 degrees so that your fingers are pointing to the ceiling. 6. Slowly raise your hand until your arm is straight up in the air. 7. Repeat steps 1-3 with the other arm. Strengthening Exercise 2 You will need a light weight for this activity. As you grow stronger, you may use a heavier weight. 1. Standing with a weight in your hand, gradually move your straight arm in an arc, starting at your side, then out in front of you, then straight up over your head. 2. Gradually move your other arm in an arc, starting at your side, then out in front of you, then straight up over your head. 3. Repeat steps 1-2 with the other arm. Strengthening Exercise 3 You will need an elastic band for this activity. As you grow stronger, gradually increase the size of the bands or increase the number of bands that you use at one time. 1. While standing, hold an elastic band in one hand and raise that arm up in the air. 2. With your other hand, pull down the band until that hand is by your side. 3. Repeat steps 1-2 with the other arm.   This information is not intended to  replace advice given to you by your health care provider. Make sure you discuss any questions you have with your health care provider.   Document Released: 04/29/2003 Document Revised: 12/15/2014 Document Reviewed: 07/27/2014 Elsevier Interactive Patient Education [employer].
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
## 2015-12-31 NOTE — ED Notes (Signed)
Right arm swelling and pain since last night.

## 2016-01-02 DIAGNOSIS — Z886 Allergy status to analgesic agent status: Secondary | ICD-10-CM | POA: Diagnosis not present

## 2016-01-02 DIAGNOSIS — Z885 Allergy status to narcotic agent status: Secondary | ICD-10-CM | POA: Diagnosis not present

## 2016-01-02 DIAGNOSIS — I10 Essential (primary) hypertension: Secondary | ICD-10-CM | POA: Diagnosis not present

## 2016-01-02 DIAGNOSIS — Z87891 Personal history of nicotine dependence: Secondary | ICD-10-CM | POA: Diagnosis not present

## 2016-01-02 DIAGNOSIS — R42 Dizziness and giddiness: Secondary | ICD-10-CM | POA: Diagnosis not present

## 2016-01-02 DIAGNOSIS — G35 Multiple sclerosis: Secondary | ICD-10-CM | POA: Diagnosis not present

## 2016-01-02 DIAGNOSIS — D649 Anemia, unspecified: Secondary | ICD-10-CM | POA: Diagnosis not present

## 2016-01-02 DIAGNOSIS — G4730 Sleep apnea, unspecified: Secondary | ICD-10-CM | POA: Diagnosis not present

## 2016-01-02 DIAGNOSIS — R002 Palpitations: Secondary | ICD-10-CM | POA: Diagnosis not present

## 2016-01-02 DIAGNOSIS — Z888 Allergy status to other drugs, medicaments and biological substances status: Secondary | ICD-10-CM | POA: Diagnosis not present

## 2016-01-02 DIAGNOSIS — G8929 Other chronic pain: Secondary | ICD-10-CM | POA: Diagnosis not present

## 2016-01-03 DIAGNOSIS — Z882 Allergy status to sulfonamides status: Secondary | ICD-10-CM | POA: Diagnosis not present

## 2016-01-03 DIAGNOSIS — E785 Hyperlipidemia, unspecified: Secondary | ICD-10-CM | POA: Diagnosis not present

## 2016-01-03 DIAGNOSIS — G35 Multiple sclerosis: Secondary | ICD-10-CM | POA: Diagnosis not present

## 2016-01-03 DIAGNOSIS — Z8589 Personal history of malignant neoplasm of other organs and systems: Secondary | ICD-10-CM | POA: Diagnosis not present

## 2016-01-03 DIAGNOSIS — I471 Supraventricular tachycardia: Secondary | ICD-10-CM | POA: Diagnosis not present

## 2016-01-03 DIAGNOSIS — Z9049 Acquired absence of other specified parts of digestive tract: Secondary | ICD-10-CM | POA: Diagnosis not present

## 2016-01-03 DIAGNOSIS — Z885 Allergy status to narcotic agent status: Secondary | ICD-10-CM | POA: Diagnosis not present

## 2016-01-03 DIAGNOSIS — R1013 Epigastric pain: Secondary | ICD-10-CM | POA: Diagnosis not present

## 2016-01-03 DIAGNOSIS — J45909 Unspecified asthma, uncomplicated: Secondary | ICD-10-CM | POA: Diagnosis not present

## 2016-01-03 DIAGNOSIS — R7303 Prediabetes: Secondary | ICD-10-CM | POA: Diagnosis not present

## 2016-01-03 DIAGNOSIS — I10 Essential (primary) hypertension: Secondary | ICD-10-CM | POA: Diagnosis not present

## 2016-01-03 DIAGNOSIS — R42 Dizziness and giddiness: Secondary | ICD-10-CM | POA: Diagnosis not present

## 2016-01-03 DIAGNOSIS — Z87891 Personal history of nicotine dependence: Secondary | ICD-10-CM | POA: Diagnosis not present

## 2016-01-03 DIAGNOSIS — Z79899 Other long term (current) drug therapy: Secondary | ICD-10-CM | POA: Diagnosis not present

## 2016-01-03 DIAGNOSIS — R0602 Shortness of breath: Secondary | ICD-10-CM | POA: Diagnosis not present

## 2016-01-03 DIAGNOSIS — Z880 Allergy status to penicillin: Secondary | ICD-10-CM | POA: Diagnosis not present

## 2016-01-03 DIAGNOSIS — D649 Anemia, unspecified: Secondary | ICD-10-CM | POA: Diagnosis not present

## 2016-01-03 DIAGNOSIS — Z886 Allergy status to analgesic agent status: Secondary | ICD-10-CM | POA: Diagnosis not present

## 2016-01-03 DIAGNOSIS — Z888 Allergy status to other drugs, medicaments and biological substances status: Secondary | ICD-10-CM | POA: Diagnosis not present

## 2016-01-03 DIAGNOSIS — K219 Gastro-esophageal reflux disease without esophagitis: Secondary | ICD-10-CM | POA: Diagnosis not present

## 2016-01-03 DIAGNOSIS — Z883 Allergy status to other anti-infective agents status: Secondary | ICD-10-CM | POA: Diagnosis not present

## 2016-01-03 DIAGNOSIS — Z91018 Allergy to other foods: Secondary | ICD-10-CM | POA: Diagnosis not present

## 2016-01-05 ENCOUNTER — Encounter: Payer: Self-pay | Admitting: Family Medicine

## 2016-01-05 ENCOUNTER — Ambulatory Visit (INDEPENDENT_AMBULATORY_CARE_PROVIDER_SITE_OTHER): Payer: Medicare Other | Admitting: Family Medicine

## 2016-01-05 VITALS — BP 142/81 | HR 90 | Resp 18 | Wt 206.1 lb

## 2016-01-05 DIAGNOSIS — I10 Essential (primary) hypertension: Secondary | ICD-10-CM

## 2016-01-05 DIAGNOSIS — R1013 Epigastric pain: Secondary | ICD-10-CM

## 2016-01-05 DIAGNOSIS — F332 Major depressive disorder, recurrent severe without psychotic features: Secondary | ICD-10-CM

## 2016-01-05 MED ORDER — PROPRANOLOL HCL 10 MG PO TABS: 5.0000 mg | ORAL_TABLET | Freq: Two times a day (BID) | ORAL | Status: DC

## 2016-01-05 NOTE — Progress Notes (Signed)
Subjective:    CC: Feeling down.   HPI: Patient comes in today complaining that she's been feeling more down over the last several weeks she's been "hibernating". She's been staying in bed and not really getting up in doing anything. She said she's been having multiple conversations with herself to get out and do things but just hasn't felt motivated. She has been dissipating in an online support program for depression and anxiety through [place]. Check she just got a letter from her previous therapist that she is going to be leaving [place]. She has not seen a therapist in over a year but one time was going regularly for several years. She seems very distraught by this even though she hasn't been seen her recently. He notes she has been withdrawing socially. Not went to reach out to friends and family. She has had some thoughts of not being here but denies any thoughts of wanting to harm herself  Hypertension-uncontrolled. She says the Bystolic made her extremely fatigued and sleepy and so decided to stop it on her own. We trended then tried the amlodipine but says it really wasn't controlling her blood pressure and in fact went to the emergency room 2 days ago for elevated blood pressure levels. She decided to restart the labetalol. Even though she doesn't feel good on it and she says it causes GI upset and epigastric discomfort it does seem to control her blood pressure. She's been taking a quarter of a pill twice a day. Next  Constipation-she did take 2 Ex-Lax last night. She's been very nauseated and had some epigastric pain which she relates the labetalol but says it could also be coming from her constipation as well. Next  She feels like she just has an internal tremor even though she's not noticing an extra tremor in her hands.  Past medical history, Surgical history, Family history not pertinant except as noted below, Social history, Allergies, and medications have been entered into  the medical record, reviewed, and corrections made.   Review of Systems: No fevers, chills, night sweats, weight loss, chest pain, or shortness of breath.   Objective:    General: Well Developed, well nourished, and in no acute distress.  Neuro: Alert and oriented x3, extra-ocular muscles intact, sensation grossly intact.  HEENT: Normocephalic, atraumatic  Skin: Warm and dry, no rashes. Cardiac: Regular rate and rhythm, no murmurs rubs or gallops, no lower extremity edema.  Respiratory: Clear to auscultation bilaterally. Not using accessory muscles, speaking in full sentences.   Impression and Recommendations:    Hypertension-blood pressure is borderline today but typically is well controlled when she's on the labetalol. Recommend a trial of a different beta blocker just to see if maybe she tolerates it better. We discussed trying propranolol. New prescription sent to the pharmacy. Will add Bystolic to intolerance list. Next constipation-recommend repeat dosing of Ex-Lax to see me to get her bowels moving. Next  Epigastric discomfort-unclear if this is likely related to constipation or the labetalol. Could certainly use Tums etc. if she needs to. She was previously on a PPI but is no longer taking 1. Next  Depression-it definitely sounds like she is in an acute depressive episode. We discussed therapy versus medications and trying to increase exercise. She is, try to find out if her therapist Earnest Bailey is moving to a new location. If she can find out where the we can place an referral. If not then I strongly encouraged her to consider getting a new  therapist that she seems extremely hesitant about this. She says she doesn't want to have to retell her life story but I expressed this and think it's extremely important for her to try to help herself. We also discussed possibly considering a medication like Cymbalta. It could help with depression as well as her fibromyalgia a  She also mentions some  tingling in her feet and toes but we did not go into this in depth today.  Time spent 45 minutes, greater than 50% time spent counseling about blood pressure, abdominal pain, and depression.

## 2016-01-06 ENCOUNTER — Telehealth: Payer: Self-pay | Admitting: Family Medicine

## 2016-01-06 DIAGNOSIS — R140 Abdominal distension (gaseous): Secondary | ICD-10-CM

## 2016-01-06 DIAGNOSIS — R109 Unspecified abdominal pain: Secondary | ICD-10-CM

## 2016-01-06 NOTE — Telephone Encounter (Signed)
lvm informing pt of recommendations. Asked that she return call for allergy testing.Jennifer [name]

## 2016-01-06 NOTE — Telephone Encounter (Signed)
Call patient: I went back and looked and we did a food panel test on her back in 2014. It was an IgE test. That's when we found the milk issue. But I would like for her to consider doing a food panel that is for the IgG antibody to see if this could be causing some of her bloating issue. If she is okay with this and please place order.

## 2016-01-07 ENCOUNTER — Encounter (HOSPITAL_BASED_OUTPATIENT_CLINIC_OR_DEPARTMENT_OTHER): Payer: Self-pay | Admitting: Emergency Medicine

## 2016-01-07 ENCOUNTER — Emergency Department (HOSPITAL_BASED_OUTPATIENT_CLINIC_OR_DEPARTMENT_OTHER)
Admission: EM | Admit: 2016-01-07 | Discharge: 2016-01-07 | Disposition: A | Discharge status: Home / Self Care | Payer: Medicare Other | Attending: Emergency Medicine | Admitting: Emergency Medicine

## 2016-01-07 ENCOUNTER — Telehealth: Payer: Self-pay | Admitting: *Deleted

## 2016-01-07 ENCOUNTER — Emergency Department (HOSPITAL_BASED_OUTPATIENT_CLINIC_OR_DEPARTMENT_OTHER): Payer: Medicare Other

## 2016-01-07 DIAGNOSIS — G8929 Other chronic pain: Secondary | ICD-10-CM | POA: Diagnosis not present

## 2016-01-07 DIAGNOSIS — I10 Essential (primary) hypertension: Secondary | ICD-10-CM | POA: Diagnosis not present

## 2016-01-07 DIAGNOSIS — Z79899 Other long term (current) drug therapy: Secondary | ICD-10-CM | POA: Insufficient documentation

## 2016-01-07 DIAGNOSIS — R109 Unspecified abdominal pain: Secondary | ICD-10-CM | POA: Diagnosis not present

## 2016-01-07 DIAGNOSIS — E785 Hyperlipidemia, unspecified: Secondary | ICD-10-CM | POA: Diagnosis not present

## 2016-01-07 DIAGNOSIS — Z87891 Personal history of nicotine dependence: Secondary | ICD-10-CM | POA: Diagnosis not present

## 2016-01-07 DIAGNOSIS — J45909 Unspecified asthma, uncomplicated: Secondary | ICD-10-CM | POA: Insufficient documentation

## 2016-01-07 DIAGNOSIS — R1030 Lower abdominal pain, unspecified: Secondary | ICD-10-CM | POA: Diagnosis not present

## 2016-01-07 DIAGNOSIS — K5900 Constipation, unspecified: Secondary | ICD-10-CM | POA: Diagnosis not present

## 2016-01-07 DIAGNOSIS — R1032 Left lower quadrant pain: Secondary | ICD-10-CM | POA: Diagnosis not present

## 2016-01-07 DIAGNOSIS — E876 Hypokalemia: Secondary | ICD-10-CM | POA: Diagnosis not present

## 2016-01-07 DIAGNOSIS — R140 Abdominal distension (gaseous): Secondary | ICD-10-CM | POA: Diagnosis not present

## 2016-01-07 LAB — CBC AND DIFFERENTIAL
HCT: 42 % (ref 36–46)
Hemoglobin: 14.3 g/dL (ref 12.0–16.0)
Neutrophils Absolute: 5 /uL
Platelets: 213 10*3/uL (ref 150–399)
WBC: 6.9 10^3/mL

## 2016-01-07 LAB — BASIC METABOLIC PANEL
BUN: 18 mg/dL (ref 4–21)
Creatinine: 0.7 mg/dL (ref <=1.1)
Potassium: 3.8 mmol/L (ref 3.4–5.3)
Sodium: 144 mmol/L (ref 137–147)

## 2016-01-07 NOTE — Telephone Encounter (Signed)
Pt called and stated that her belly is still hurting. She said when she drinks fluids she feels full, lower part of her belly below her belly button. She was c/o this yesterday and said that you know about this. Will fwd to pcp for advice.[name] [place]

## 2016-01-07 NOTE — ED Notes (Signed)
MD at bedside. 
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
## 2016-01-07 NOTE — Discharge Instructions (Signed)
Constipation, Adult Try milk of magnesia as directed for constipation. Schedule an office appointment with your gastroenterologist to help further determine the cause of your abdominal discomfort. Constipation is when a person:  Poops (has a bowel movement) less than 3 times a week.  Has a hard time pooping.  Has poop that is dry, hard, or bigger than normal. HOME CARE   Eat foods with a lot of fiber in them. This includes fruits, vegetables, beans, and whole grains such as brown rice.  Avoid fatty foods and foods with a lot of sugar. This includes french fries, hamburgers, cookies, candy, and soda.  If you are not getting enough fiber from food, take products with added fiber in them (supplements).  Drink enough fluid to keep your pee (urine) clear or pale yellow.  Exercise on a regular basis, or as told by your doctor.  Go to the restroom when you feel like you need to poop. Do not hold it.  Only take medicine as told by your doctor. Do not take medicines that help you poop (laxatives) without talking to your doctor first. GET HELP RIGHT AWAY IF:   You have bright red blood in your poop (stool).  Your constipation lasts more than 4 days or gets worse.  You have belly (abdominal) or butt (rectal) pain.  You have thin poop (as thin as a pencil).  You lose weight, and it cannot be explained. MAKE SURE YOU:   Understand these instructions.  Will watch your condition.  Will get help right away if you are not doing well or get worse.   This information is not intended to replace advice given to you by your health care provider. Make sure you discuss any questions you have with your health care provider.   Document Released: 01/17/2008 Document Revised: 08/21/2014 Document Reviewed: 05/12/2013 Elsevier Interactive Patient Education [employer].

## 2016-01-07 NOTE — ED Notes (Addendum)
Pt reports lower abdominal bloating and pain x2 days, pt has been taking x-lax with little relief.  Pt has had small bowel movements over the last two days and has had mucus in stool.  Pt also states she has not been able to take in fluids/food due to feeling bloated, but has not thrown up.  Pt anxious in triage.  Pt refused to dress into gown due to it causing a rash in the past.

## 2016-01-10 DIAGNOSIS — R140 Abdominal distension (gaseous): Secondary | ICD-10-CM | POA: Diagnosis not present

## 2016-01-10 DIAGNOSIS — Z886 Allergy status to analgesic agent status: Secondary | ICD-10-CM | POA: Diagnosis not present

## 2016-01-10 DIAGNOSIS — G35 Multiple sclerosis: Secondary | ICD-10-CM | POA: Diagnosis not present

## 2016-01-10 DIAGNOSIS — K5900 Constipation, unspecified: Secondary | ICD-10-CM | POA: Diagnosis not present

## 2016-01-10 DIAGNOSIS — I10 Essential (primary) hypertension: Secondary | ICD-10-CM | POA: Diagnosis not present

## 2016-01-10 DIAGNOSIS — R1031 Right lower quadrant pain: Secondary | ICD-10-CM | POA: Diagnosis not present

## 2016-01-10 DIAGNOSIS — G4733 Obstructive sleep apnea (adult) (pediatric): Secondary | ICD-10-CM | POA: Diagnosis not present

## 2016-01-10 DIAGNOSIS — R109 Unspecified abdominal pain: Secondary | ICD-10-CM | POA: Diagnosis not present

## 2016-01-10 DIAGNOSIS — G8929 Other chronic pain: Secondary | ICD-10-CM | POA: Diagnosis not present

## 2016-01-10 DIAGNOSIS — Z888 Allergy status to other drugs, medicaments and biological substances status: Secondary | ICD-10-CM | POA: Diagnosis not present

## 2016-01-10 DIAGNOSIS — R1032 Left lower quadrant pain: Secondary | ICD-10-CM | POA: Diagnosis not present

## 2016-01-10 DIAGNOSIS — Z885 Allergy status to narcotic agent status: Secondary | ICD-10-CM | POA: Diagnosis not present

## 2016-01-10 DIAGNOSIS — Z87891 Personal history of nicotine dependence: Secondary | ICD-10-CM | POA: Diagnosis not present

## 2016-01-10 DIAGNOSIS — Z9049 Acquired absence of other specified parts of digestive tract: Secondary | ICD-10-CM | POA: Diagnosis not present

## 2016-01-10 DIAGNOSIS — I878 Other specified disorders of veins: Secondary | ICD-10-CM | POA: Diagnosis not present

## 2016-01-12 ENCOUNTER — Telehealth: Payer: Self-pay | Admitting: *Deleted

## 2016-01-12 NOTE — Telephone Encounter (Signed)
Try 3 capfuls of MiraLAX mixed in 24 ounces. Can repeat daily until stool is liquid.

## 2016-01-12 NOTE — Telephone Encounter (Signed)
Called and informed pt of results. She stated that she is still having a lot of stool and wanted to know if she should get another xray([facility]). She has taken several laxatives and has not gotten any relief she has been back and forth to the ED and doesn't want to have to go back. Will fwd to pcp for f/u.[name] [place]

## 2016-01-13 ENCOUNTER — Encounter: Payer: Self-pay | Admitting: Family Medicine

## 2016-01-13 DIAGNOSIS — K5900 Constipation, unspecified: Secondary | ICD-10-CM | POA: Diagnosis not present

## 2016-01-13 DIAGNOSIS — R140 Abdominal distension (gaseous): Secondary | ICD-10-CM | POA: Diagnosis not present

## 2016-01-13 DIAGNOSIS — D649 Anemia, unspecified: Secondary | ICD-10-CM | POA: Diagnosis not present

## 2016-01-13 DIAGNOSIS — R1030 Lower abdominal pain, unspecified: Secondary | ICD-10-CM | POA: Diagnosis not present

## 2016-01-13 DIAGNOSIS — I10 Essential (primary) hypertension: Secondary | ICD-10-CM | POA: Diagnosis not present

## 2016-01-13 DIAGNOSIS — G8929 Other chronic pain: Secondary | ICD-10-CM | POA: Diagnosis not present

## 2016-01-13 DIAGNOSIS — G35 Multiple sclerosis: Secondary | ICD-10-CM | POA: Diagnosis not present

## 2016-01-13 DIAGNOSIS — R1011 Right upper quadrant pain: Secondary | ICD-10-CM | POA: Diagnosis not present

## 2016-01-13 DIAGNOSIS — R110 Nausea: Secondary | ICD-10-CM | POA: Diagnosis not present

## 2016-01-13 LAB — BASIC METABOLIC PANEL: Glucose: 126 mg/dL

## 2016-01-15 IMAGING — CR DG CHEST 2V
2 series · 2 of 2 positions shown · non-contrast
Comparison: 09/29/2014

CLINICAL DATA: bilateral temporal region headache today after
walking, dizziness, hx of HTN, has extensive medical hx, no other
complaints

EXAM:
CHEST - 2 VIEW

[w chest pa]
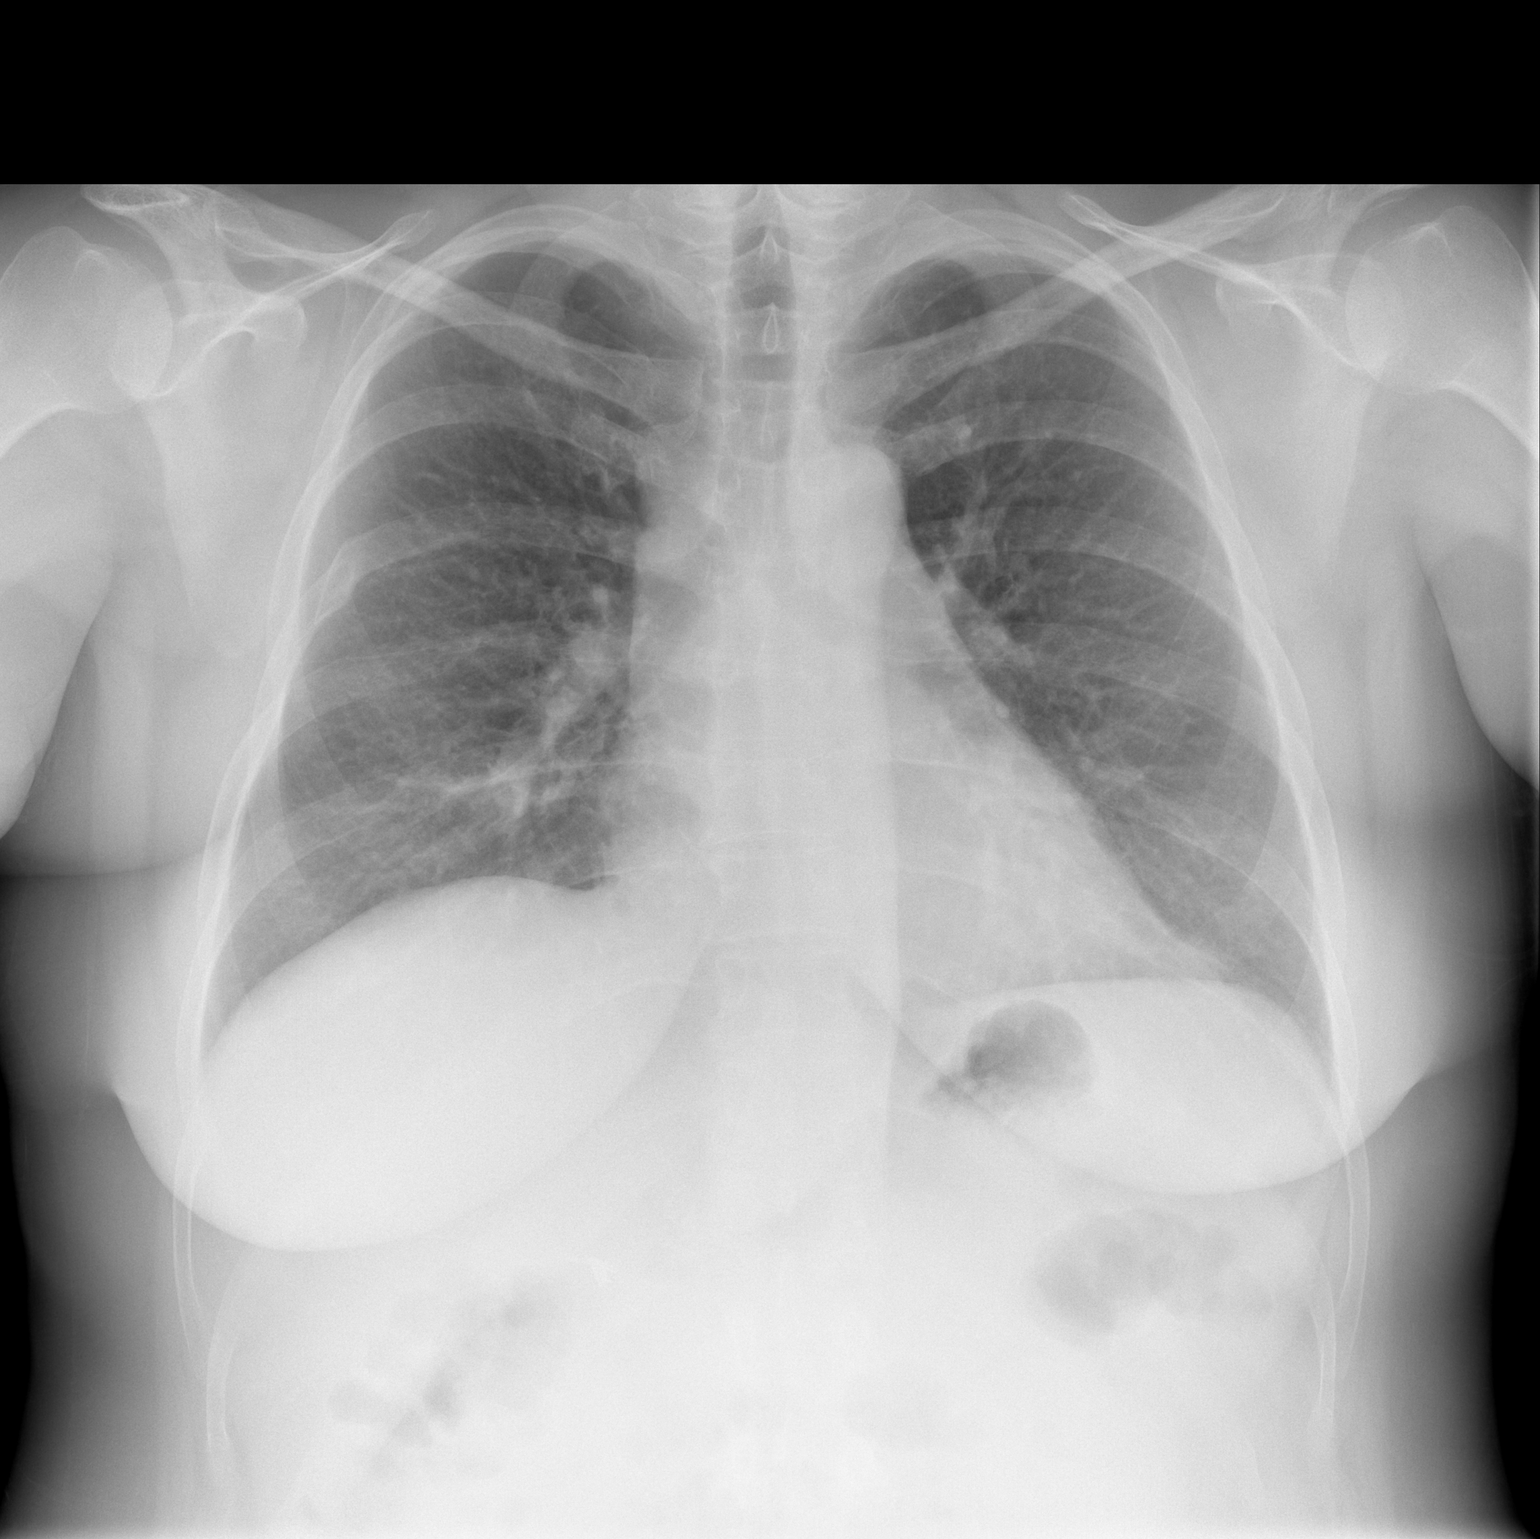

[w chest lat]
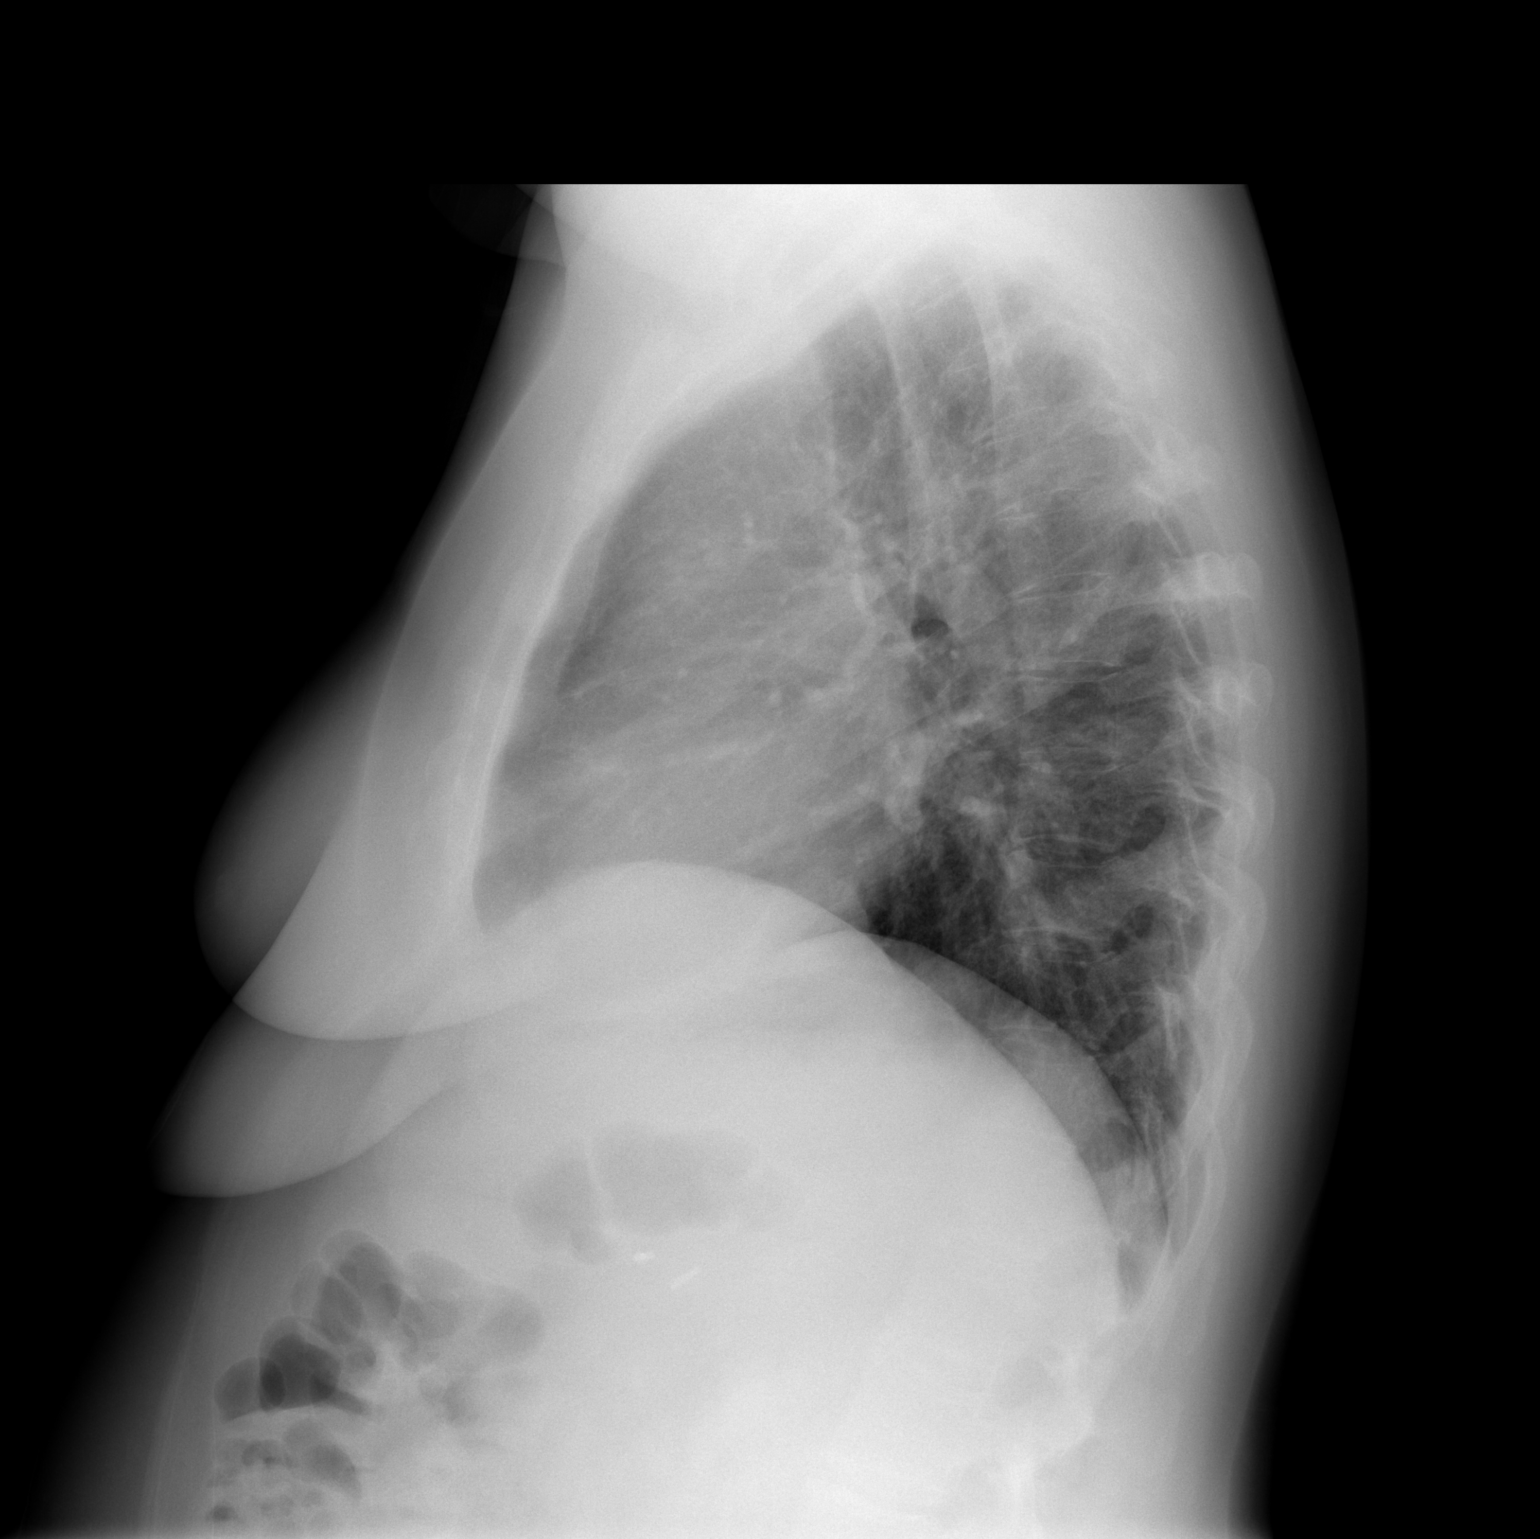

[2 of 2 positions shown; findings below may reference images not displayed]

FINDINGS: Lungs are clear. Heart size and mediastinal contours are within
normal limits.
No effusion.
Surgical clips right upper abdomen. Old healed right sixth rib
fracture.
IMPRESSION: No acute cardiopulmonary disease.

## 2016-01-17 DIAGNOSIS — R1084 Generalized abdominal pain: Secondary | ICD-10-CM | POA: Diagnosis not present

## 2016-01-17 DIAGNOSIS — Z91018 Allergy to other foods: Secondary | ICD-10-CM | POA: Diagnosis not present

## 2016-01-17 DIAGNOSIS — M549 Dorsalgia, unspecified: Secondary | ICD-10-CM | POA: Diagnosis not present

## 2016-01-17 DIAGNOSIS — Z87891 Personal history of nicotine dependence: Secondary | ICD-10-CM | POA: Diagnosis not present

## 2016-01-17 DIAGNOSIS — K219 Gastro-esophageal reflux disease without esophagitis: Secondary | ICD-10-CM | POA: Diagnosis not present

## 2016-01-17 DIAGNOSIS — Z885 Allergy status to narcotic agent status: Secondary | ICD-10-CM | POA: Diagnosis not present

## 2016-01-17 DIAGNOSIS — E2609 Other primary hyperaldosteronism: Secondary | ICD-10-CM | POA: Diagnosis not present

## 2016-01-17 DIAGNOSIS — N281 Cyst of kidney, acquired: Secondary | ICD-10-CM | POA: Diagnosis not present

## 2016-01-17 DIAGNOSIS — K5909 Other constipation: Secondary | ICD-10-CM | POA: Diagnosis not present

## 2016-01-17 DIAGNOSIS — I471 Supraventricular tachycardia: Secondary | ICD-10-CM | POA: Diagnosis not present

## 2016-01-17 DIAGNOSIS — Z886 Allergy status to analgesic agent status: Secondary | ICD-10-CM | POA: Diagnosis not present

## 2016-01-17 DIAGNOSIS — K2270 Barrett's esophagus without dysplasia: Secondary | ICD-10-CM | POA: Diagnosis not present

## 2016-01-17 DIAGNOSIS — E785 Hyperlipidemia, unspecified: Secondary | ICD-10-CM | POA: Diagnosis not present

## 2016-01-17 DIAGNOSIS — G35 Multiple sclerosis: Secondary | ICD-10-CM | POA: Diagnosis not present

## 2016-01-17 DIAGNOSIS — Z881 Allergy status to other antibiotic agents status: Secondary | ICD-10-CM | POA: Diagnosis not present

## 2016-01-17 DIAGNOSIS — R109 Unspecified abdominal pain: Secondary | ICD-10-CM | POA: Diagnosis not present

## 2016-01-17 DIAGNOSIS — Z880 Allergy status to penicillin: Secondary | ICD-10-CM | POA: Diagnosis not present

## 2016-01-17 DIAGNOSIS — R110 Nausea: Secondary | ICD-10-CM | POA: Diagnosis not present

## 2016-01-17 DIAGNOSIS — G43909 Migraine, unspecified, not intractable, without status migrainosus: Secondary | ICD-10-CM | POA: Diagnosis not present

## 2016-01-17 DIAGNOSIS — Z888 Allergy status to other drugs, medicaments and biological substances status: Secondary | ICD-10-CM | POA: Diagnosis not present

## 2016-01-17 DIAGNOSIS — N200 Calculus of kidney: Secondary | ICD-10-CM | POA: Diagnosis not present

## 2016-01-17 DIAGNOSIS — G8929 Other chronic pain: Secondary | ICD-10-CM | POA: Diagnosis not present

## 2016-01-17 DIAGNOSIS — R569 Unspecified convulsions: Secondary | ICD-10-CM | POA: Diagnosis not present

## 2016-01-17 DIAGNOSIS — K5900 Constipation, unspecified: Secondary | ICD-10-CM | POA: Diagnosis not present

## 2016-01-17 DIAGNOSIS — Z882 Allergy status to sulfonamides status: Secondary | ICD-10-CM | POA: Diagnosis not present

## 2016-01-17 DIAGNOSIS — I10 Essential (primary) hypertension: Secondary | ICD-10-CM | POA: Diagnosis not present

## 2016-01-17 NOTE — Telephone Encounter (Signed)
Jennifer [name] states she has tried Ex-lax, magnesium citrate and milk of magnesia without relief. She will not try the Miralax because it doesn't work for her. She states she is going back to [facility] to have comparison X-rays.

## 2016-01-19 DIAGNOSIS — G8929 Other chronic pain: Secondary | ICD-10-CM | POA: Diagnosis not present

## 2016-01-19 DIAGNOSIS — G4733 Obstructive sleep apnea (adult) (pediatric): Secondary | ICD-10-CM | POA: Diagnosis not present

## 2016-01-19 DIAGNOSIS — G35 Multiple sclerosis: Secondary | ICD-10-CM | POA: Diagnosis not present

## 2016-01-19 DIAGNOSIS — R451 Restlessness and agitation: Secondary | ICD-10-CM | POA: Diagnosis not present

## 2016-01-19 DIAGNOSIS — R109 Unspecified abdominal pain: Secondary | ICD-10-CM | POA: Diagnosis not present

## 2016-01-19 DIAGNOSIS — I10 Essential (primary) hypertension: Secondary | ICD-10-CM | POA: Diagnosis not present

## 2016-01-19 DIAGNOSIS — R112 Nausea with vomiting, unspecified: Secondary | ICD-10-CM | POA: Diagnosis not present

## 2016-01-19 DIAGNOSIS — E860 Dehydration: Secondary | ICD-10-CM | POA: Diagnosis not present

## 2016-01-19 DIAGNOSIS — K5900 Constipation, unspecified: Secondary | ICD-10-CM | POA: Diagnosis not present

## 2016-01-19 DIAGNOSIS — Z87891 Personal history of nicotine dependence: Secondary | ICD-10-CM | POA: Diagnosis not present

## 2016-01-19 DIAGNOSIS — R079 Chest pain, unspecified: Secondary | ICD-10-CM | POA: Diagnosis not present

## 2016-01-19 DIAGNOSIS — D649 Anemia, unspecified: Secondary | ICD-10-CM | POA: Diagnosis not present

## 2016-01-20 ENCOUNTER — Ambulatory Visit (INDEPENDENT_AMBULATORY_CARE_PROVIDER_SITE_OTHER): Payer: Medicare Other | Admitting: Family Medicine

## 2016-01-20 ENCOUNTER — Encounter: Payer: Self-pay | Admitting: Family Medicine

## 2016-01-20 VITALS — BP 140/79 | HR 85 | Wt 207.0 lb

## 2016-01-20 DIAGNOSIS — F332 Major depressive disorder, recurrent severe without psychotic features: Secondary | ICD-10-CM | POA: Diagnosis not present

## 2016-01-20 DIAGNOSIS — I10 Essential (primary) hypertension: Secondary | ICD-10-CM | POA: Diagnosis not present

## 2016-01-20 DIAGNOSIS — K5900 Constipation, unspecified: Secondary | ICD-10-CM

## 2016-01-20 DIAGNOSIS — R140 Abdominal distension (gaseous): Secondary | ICD-10-CM | POA: Diagnosis not present

## 2016-01-20 DIAGNOSIS — K5909 Other constipation: Secondary | ICD-10-CM

## 2016-01-20 MED ORDER — LINACLOTIDE 290 MCG PO CAPS: 290.0000 ug | ORAL_CAPSULE | Freq: Every day | ORAL | Status: DC

## 2016-01-20 NOTE — Progress Notes (Signed)
Subjective:    CC: F/U BP  HPI:  HTN - she stopped the propranolol and is back on the labetolol.  About 2 days ago she started to get some pain in her left chest radiating down her left arm again. She actually went to the emergency department and they checked her out. She was also having some difficulty with constipation as well. They gave her prescription for Cytotec. She said she didn't pick it up because she saw that it was a hormone treatment and was confused about his use for chronic constipation.  F/U Depression - she has not felt more motivated to get up and do things but says she just feels still sad and off. She still feels trapped in her marriage currently. She is really just not happy overall. I asked her if she was able to find out where her therapist from several years ago and moved to. She said she had some paperwork at home that she was in a look at. But we may need to find her a new therapist.  Chronic constipation - she finally had a fairly normal bowel movement today for the first time in a couple of weeks. She said she's tried milk of magnesia, she tried some laxatives. She's tried increasing her fiber. Tried drinking prune juice etc. She says she really doesn't tolerate MiraLAX well because she feels like it makes her throat feel like it's getting bit. She is even tried diluting the MiraLAX.  Past medical history, Surgical history, Family history not pertinant except as noted below, Social history, Allergies, and medications have been entered into the medical record, reviewed, and corrections made.   Review of Systems: No fevers, chills, night sweats, weight loss, chest pain, or shortness of breath.   Objective:    General: Well Developed, well nourished, and in no acute distress.  Neuro: Alert and oriented x3, extra-ocular muscles intact, sensation grossly intact.  HEENT: Normocephalic, atraumatic  Skin: Warm and dry, no rashes. Cardiac: Regular rate and rhythm, no murmurs  rubs or gallops, no lower extremity edema.  Respiratory: Clear to auscultation bilaterally. Not using accessory muscles, speaking in full sentences. Abd: Nontender, normal BS.  Mildly tender in the epigastric and LUQ area.     Impression and Recommendations:   HTN - Continue with labetalol for now we'll follow over the next 2 weeks.  Depression - still encourage her to get in with a therapist or counselor. I really think it would make a big difference. She is not interested in medication at this time.  Constipation-recommend a trial of Linzess or Amitiza. We'll start with Linzess since it's once a day. Continue to increase fiber in the diet and drink plenty of water. Make sure to take the medication about 30 minutes before first [place] day on an empty stomach.

## 2016-01-20 NOTE — Patient Instructions (Signed)
Call me on Monday and let me know if you're not able to find out where her old therapist is working and if they take your insurance.

## 2016-01-24 ENCOUNTER — Emergency Department (HOSPITAL_BASED_OUTPATIENT_CLINIC_OR_DEPARTMENT_OTHER)
Admission: EM | Admit: 2016-01-24 | Discharge: 2016-01-24 | Disposition: A | Discharge status: Home / Self Care | Payer: Medicare Other | Attending: Emergency Medicine | Admitting: Emergency Medicine

## 2016-01-24 ENCOUNTER — Encounter (HOSPITAL_BASED_OUTPATIENT_CLINIC_OR_DEPARTMENT_OTHER): Payer: Self-pay

## 2016-01-24 DIAGNOSIS — J45909 Unspecified asthma, uncomplicated: Secondary | ICD-10-CM | POA: Insufficient documentation

## 2016-01-24 DIAGNOSIS — E785 Hyperlipidemia, unspecified: Secondary | ICD-10-CM | POA: Diagnosis not present

## 2016-01-24 DIAGNOSIS — Z8542 Personal history of malignant neoplasm of other parts of uterus: Secondary | ICD-10-CM | POA: Diagnosis not present

## 2016-01-24 DIAGNOSIS — Z87891 Personal history of nicotine dependence: Secondary | ICD-10-CM | POA: Insufficient documentation

## 2016-01-24 DIAGNOSIS — Z79899 Other long term (current) drug therapy: Secondary | ICD-10-CM | POA: Insufficient documentation

## 2016-01-24 DIAGNOSIS — K219 Gastro-esophageal reflux disease without esophagitis: Secondary | ICD-10-CM | POA: Insufficient documentation

## 2016-01-24 DIAGNOSIS — M549 Dorsalgia, unspecified: Secondary | ICD-10-CM | POA: Diagnosis present

## 2016-01-24 DIAGNOSIS — IMO0001 Reserved for inherently not codable concepts without codable children: Secondary | ICD-10-CM

## 2016-01-24 MED ORDER — SUCRALFATE 1 GM/10ML PO SUSP: 1.0000 g | Freq: Three times a day (TID) | ORAL | Status: DC
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
## 2016-01-24 NOTE — ED Notes (Signed)
C/o right mid/side back pain-started yesterday after getting choked on food-NAD-steady gait

## 2016-01-24 NOTE — Discharge Instructions (Signed)
Gastroesophageal Reflux Disease, Adult Normally, food travels down the esophagus and stays in the stomach to be digested. However, when a person has gastroesophageal reflux disease (GERD), food and stomach acid move back up into the esophagus. When this happens, the esophagus becomes sore and inflamed. Over time, GERD can create small holes (ulcers) in the lining of the esophagus.  CAUSES This condition is caused by a problem with the muscle between the esophagus and the stomach (lower esophageal sphincter, or LES). Normally, the LES muscle closes after food passes through the esophagus to the stomach. When the LES is weakened or abnormal, it does not close properly, and that allows food and stomach acid to go back up into the esophagus. The LES can be weakened by certain dietary substances, medicines, and medical conditions, including:  Tobacco use.  Pregnancy.  Having a hiatal hernia.  Heavy alcohol use.  Certain foods and beverages, such as coffee, chocolate, onions, and peppermint. RISK FACTORS This condition is more likely to develop in:  People who have an increased body weight.  People who have connective tissue disorders.  People who use NSAID medicines. SYMPTOMS Symptoms of this condition include:  Heartburn.  Difficult or painful swallowing.  The feeling of having a lump in the throat.  Abitter taste in the mouth.  Bad breath.  Having a large amount of saliva.  Having an upset or bloated stomach.  Belching.  Chest pain.  Shortness of breath or wheezing.  Ongoing (chronic) cough or a night-time cough.  Wearing away of tooth enamel.  Weight loss. Different conditions can cause chest pain. Make sure to see your health care provider if you experience chest pain. DIAGNOSIS Your health care provider will take a medical history and perform a physical exam. To determine if you have mild or severe GERD, your health care provider may also monitor how you respond  to treatment. You may also have other tests, including:  An endoscopy toexamine your stomach and esophagus with a small camera.  A test thatmeasures the acidity level in your esophagus.  A test thatmeasures how much pressure is on your esophagus.  A barium swallow or modified barium swallow to show the shape, size, and functioning of your esophagus. TREATMENT The goal of treatment is to help relieve your symptoms and to prevent complications. Treatment for this condition may vary depending on how severe your symptoms are. Your health care provider may recommend:  Changes to your diet.  Medicine.  Surgery. HOME CARE INSTRUCTIONS Diet  Follow a diet as recommended by your health care provider. This may involve avoiding foods and drinks such as:  Coffee and tea (with or without caffeine).  Drinks that containalcohol.  Energy drinks and sports drinks.  Carbonated drinks or sodas.  Chocolate and cocoa.  Peppermint and mint flavorings.  Garlic and onions.  Horseradish.  Spicy and acidic foods, including peppers, chili powder, curry powder, vinegar, hot sauces, and barbecue sauce.  Citrus fruit juices and citrus fruits, such as oranges, lemons, and limes.  Tomato-based foods, such as red sauce, chili, salsa, and pizza with red sauce.  Fried and fatty foods, such as donuts, french fries, potato chips, and high-fat dressings.  High-fat meats, such as hot dogs and fatty cuts of red and white meats, such as rib eye steak, sausage, ham, and bacon.  High-fat dairy items, such as whole milk, butter, and cream cheese.  Eat small, frequent meals instead of large meals.  Avoid drinking large amounts of liquid with your   meals.  Avoid eating meals during the 2-3 hours before bedtime.  Avoid lying down right after you eat.  Do not exercise right after you eat. General Instructions  Pay attention to any changes in your symptoms.  Take over-the-counter and prescription  medicines only as told by your health care provider. Do not take aspirin, ibuprofen, or other NSAIDs unless your health care provider told you to do so.  Do not use any tobacco products, including cigarettes, chewing tobacco, and e-cigarettes. If you need help quitting, ask your health care provider.  Wear loose-fitting clothing. Do not wear anything tight around your waist that causes pressure on your abdomen.  Raise (elevate) the head of your bed 6 inches (15cm).  Try to reduce your stress, such as with yoga or meditation. If you need help reducing stress, ask your health care provider.  If you are overweight, reduce your weight to an amount that is healthy for you. Ask your health care provider for guidance about a safe weight loss goal.  Keep all follow-up visits as told by your health care provider. This is important. SEEK MEDICAL CARE IF:  You have new symptoms.  You have unexplained weight loss.  You have difficulty swallowing, or it hurts to swallow.  You have wheezing or a persistent cough.  Your symptoms do not improve with treatment.  You have a hoarse voice. SEEK IMMEDIATE MEDICAL CARE IF:  You have pain in your arms, neck, jaw, teeth, or back.  You feel sweaty, dizzy, or light-headed.  You have chest pain or shortness of breath.  You vomit and your vomit looks like blood or coffee grounds.  You faint.  Your stool is bloody or black.  You cannot swallow, drink, or eat.   This information is not intended to replace advice given to you by your health care provider. Make sure you discuss any questions you have with your health care provider.   Document Released: 05/10/2005 Document Revised: 04/21/2015 Document Reviewed: 11/25/2014 Elsevier Interactive Patient Education 2016 [employer]  

## 2016-01-25 ENCOUNTER — Encounter: Payer: Self-pay | Admitting: Pediatrics

## 2016-01-25 ENCOUNTER — Ambulatory Visit (INDEPENDENT_AMBULATORY_CARE_PROVIDER_SITE_OTHER): Payer: Medicare Other | Admitting: Pediatrics

## 2016-01-25 VITALS — BP 144/90 | HR 88 | Temp 98.1°F | Resp 20

## 2016-01-25 DIAGNOSIS — E876 Hypokalemia: Secondary | ICD-10-CM | POA: Diagnosis not present

## 2016-01-25 DIAGNOSIS — J4520 Mild intermittent asthma, uncomplicated: Secondary | ICD-10-CM

## 2016-01-25 DIAGNOSIS — Z9989 Dependence on other enabling machines and devices: Secondary | ICD-10-CM

## 2016-01-25 DIAGNOSIS — G4733 Obstructive sleep apnea (adult) (pediatric): Secondary | ICD-10-CM | POA: Diagnosis not present

## 2016-01-25 DIAGNOSIS — K219 Gastro-esophageal reflux disease without esophagitis: Secondary | ICD-10-CM | POA: Diagnosis not present

## 2016-01-25 LAB — BASIC METABOLIC PANEL
BUN: 10 mg/dL (ref 4–21)
Creatinine: 0.6 mg/dL (ref 0.5–1.1)
Glucose: 135 mg/dL
Potassium: 3.9 mmol/L (ref 3.4–5.3)
Sodium: 144 mmol/L (ref 137–147)

## 2016-01-25 MED ORDER — BECLOMETHASONE DIPROPIONATE 40 MCG/ACT IN AERS: 1.0000 | INHALATION_SPRAY | Freq: Two times a day (BID) | RESPIRATORY_TRACT | Status: DC

## 2016-01-25 NOTE — Progress Notes (Signed)
[place] [id number] Dept: [phone]  FOLLOW UP NOTE  Patient ID: Jennifer [name], female    DOB: [birth year]  Age: 50 y.o. MRN: [id number] Date of Office Visit: 01/25/2016  Assessment Chief Complaint: Follow-up  HPI GIONNI KONDRACKI presents for follow-up of asthma. She is on labetalol and they  plan to increase her dose of labetalol for  control of tachycardia and high blood pressure. She wants to know  which medications to use for asthmatics symptoms. She has had coughing spells from inhaled steroids in the past. She has obstructive sleep apnea and cannot tolerate CPAP. She may have multiple sclerosis  Current medications are Xopenex 2 puffs every 6 hours if needed. Her other medications are outlined in the chart.   Drug Allergies:  Allergies  Allergen Reactions  . Coreg [Carvedilol] Shortness Of Breath    CP  . Mushroom Extract Complex Anaphylaxis  . Nitrofurantoin Shortness Of Breath    REACTION: sweats  . Promethazine Hcl Anaphylaxis    jittery  . Telmisartan Swelling  . Adhesive [Tape]     EKG monitor patches, some tapes"reddnes,blisters"  . Aspirin Other (See Comments)    flushing  . Atenolol Other (See Comments)    Squeezing chest sensation  . Avelox [Moxifloxacin Hcl In Nacl] Itching and Other (See Comments)    Shortness of breath  . Azithromycin Other (See Comments)    Lip swelling, SOB.   . Beta Adrenergic Blockers     Feels like chest tighting "Metoprolol"  . Butorphanol Tartrate     REACTION: unknown  . Butorphanol Tartrate Other (See Comments)    Patient aggitated  . Cetirizine Other (See Comments)    Broke out in hives the day before, took Zyrtec &  wasn't sure if this made it worse  . Ciprofloxacin     REACTION: tongue swells  . Clonidine Hydrochloride     REACTION: makes blood pressure high  . Cortisone   . Cyprodenate Itching  . Doxycycline   . Fentanyl     High [facility] record   . Fluoxetine Hcl Other  (See Comments)    REACTION: headaches  . Iron   . Ketorolac Other (See Comments)    I can't sit still  . Ketorolac Tromethamine     jittery  . Lidocaine Other (See Comments)    "It messes me up".  "I can't take it."  . Lisinopril Cough    REACTION: cough  . Metoclopramide Hcl Other (See Comments)    Has a twitchy feeling  . Metoprolol     Other reaction(s): OTHER  . Milk-Related Compounds   . Montelukast Sodium     Unknown"Singulair"  . Naproxen   . Paroxetine Other (See Comments)    REACTION: headaches  . Pravastatin Other (See Comments)    Myalgias  . Promethazine Other (See Comments)    I can't sit still  . Sertraline Hcl     REACTION: headaches  . Spironolactone   . Stelazine [Trifluoperazine]   . Tobramycin     itching , rash  . Trifluoperazine Hcl     REACTION: unknown  . Vancomycin     Other reaction(s): Other (See Comments), Unknown Other Reaction: all mycins  . Versed [Midazolam]     High [facility] medical record  . Ceftriaxone Sodium Rash  . Erythromycin Rash  . Metronidazole Rash  . Penicillins Rash  . Prochlorperazine Anxiety  . Quinolones Rash and Swelling  . Sulfonamide Derivatives  Rash  . Venlafaxine Anxiety  . Zyrtec [Cetirizine Hcl] Rash    All over body    Physical Exam: BP 144/90 mmHg  Pulse 88  Temp(Src) 98.1 F (36.7 C)  Resp 20  LMP 06/25/2013   Physical Exam  Constitutional: She is oriented to person, place, and time. She appears well-developed and well-nourished.  HENT:  Eyes normal. Ears normal. Nose normal. Pharynx normal.  Neck: Neck supple.  Cardiovascular:  S1 and S2 normal no murmurs  Pulmonary/Chest:  Clear to percussion and auscultation  Lymphadenopathy:    She has no cervical adenopathy.  Neurological: She is alert and oriented to person, place, and time.  Psychiatric: She has a normal mood and affect. Her behavior is normal. Judgment and thought content normal.  Vitals reviewed.   Diagnostics:  FVC 2.75  L FEV1 2.50 L. Predicted FVC 3.28 L predicted FEV1 2.60 L-the spirometry is in the normal range  Assessment and Plan: 1. Mild intermittent asthma, uncomplicated   2. Obstructive sleep apnea   3. Gastroesophageal reflux disease without esophagitis     Meds ordered this encounter  Medications  . beclomethasone (QVAR) 40 MCG/ACT inhaler    Sig: Inhale 1 puff into the lungs 2 (two) times daily.    Dispense:  1 Inhaler    Refill:  5    To prevent cough or wheeze. Please keep rx on file. Pt. Will call when needed.    Patient Instructions  Xopenex 2 puffs every 6 hours if needed for coughing or wheezing. You may use Xopenex 2 puffs 5-15 minutes before exercise If you develop more breathing difficulties, start on Qvar 40-1 puff twice a day with a spacer to prevent coughing or wheezing. Then rinse, gargle  and spit out Call me if you are not doing well on this treatment plan    Return in about 6 months (around 07/26/2016).    Thank you for the opportunity to care for this patient.  Please do not hesitate to contact me with questions.  Penne Lash, M.D.  [facility] [address] [place], [place] [address] [phone]

## 2016-01-25 NOTE — Patient Instructions (Addendum)
Xopenex 2 puffs every 6 hours if needed for coughing or wheezing. You may use Xopenex 2 puffs 5-15 minutes before exercise If you develop more breathing difficulties, start on Qvar 40-1 puff twice a day with a spacer to prevent coughing or wheezing. Then rinse, gargle  and spit out Call me if you are not doing well on this treatment plan

## 2016-01-26 DIAGNOSIS — E7800 Pure hypercholesterolemia, unspecified: Secondary | ICD-10-CM | POA: Diagnosis not present

## 2016-01-26 DIAGNOSIS — R0602 Shortness of breath: Secondary | ICD-10-CM | POA: Diagnosis not present

## 2016-01-26 DIAGNOSIS — R61 Generalized hyperhidrosis: Secondary | ICD-10-CM | POA: Diagnosis not present

## 2016-01-26 DIAGNOSIS — I10 Essential (primary) hypertension: Secondary | ICD-10-CM | POA: Diagnosis not present

## 2016-01-26 DIAGNOSIS — Z885 Allergy status to narcotic agent status: Secondary | ICD-10-CM | POA: Diagnosis not present

## 2016-01-26 DIAGNOSIS — Z888 Allergy status to other drugs, medicaments and biological substances status: Secondary | ICD-10-CM | POA: Diagnosis not present

## 2016-01-26 DIAGNOSIS — R002 Palpitations: Secondary | ICD-10-CM | POA: Diagnosis not present

## 2016-01-26 DIAGNOSIS — E785 Hyperlipidemia, unspecified: Secondary | ICD-10-CM | POA: Diagnosis not present

## 2016-01-26 DIAGNOSIS — R000 Tachycardia, unspecified: Secondary | ICD-10-CM | POA: Diagnosis not present

## 2016-01-26 DIAGNOSIS — Z886 Allergy status to analgesic agent status: Secondary | ICD-10-CM | POA: Diagnosis not present

## 2016-01-26 DIAGNOSIS — Z79899 Other long term (current) drug therapy: Secondary | ICD-10-CM | POA: Diagnosis not present

## 2016-01-28 ENCOUNTER — Emergency Department (HOSPITAL_BASED_OUTPATIENT_CLINIC_OR_DEPARTMENT_OTHER)
Admission: EM | Admit: 2016-01-28 | Discharge: 2016-01-28 | Disposition: A | Discharge status: Home / Self Care | Payer: Medicare Other | Attending: Emergency Medicine | Admitting: Emergency Medicine

## 2016-01-28 ENCOUNTER — Encounter (HOSPITAL_BASED_OUTPATIENT_CLINIC_OR_DEPARTMENT_OTHER): Payer: Self-pay | Admitting: *Deleted

## 2016-01-28 DIAGNOSIS — J45909 Unspecified asthma, uncomplicated: Secondary | ICD-10-CM | POA: Insufficient documentation

## 2016-01-28 DIAGNOSIS — Z79899 Other long term (current) drug therapy: Secondary | ICD-10-CM | POA: Insufficient documentation

## 2016-01-28 DIAGNOSIS — Z87891 Personal history of nicotine dependence: Secondary | ICD-10-CM | POA: Diagnosis not present

## 2016-01-28 DIAGNOSIS — R1032 Left lower quadrant pain: Secondary | ICD-10-CM | POA: Diagnosis not present

## 2016-01-28 DIAGNOSIS — R1031 Right lower quadrant pain: Secondary | ICD-10-CM | POA: Diagnosis not present

## 2016-01-28 DIAGNOSIS — R002 Palpitations: Secondary | ICD-10-CM | POA: Diagnosis not present

## 2016-01-28 DIAGNOSIS — R1084 Generalized abdominal pain: Secondary | ICD-10-CM | POA: Diagnosis not present

## 2016-01-28 DIAGNOSIS — R112 Nausea with vomiting, unspecified: Secondary | ICD-10-CM | POA: Diagnosis not present

## 2016-01-28 DIAGNOSIS — I10 Essential (primary) hypertension: Secondary | ICD-10-CM | POA: Diagnosis not present

## 2016-01-28 DIAGNOSIS — E785 Hyperlipidemia, unspecified: Secondary | ICD-10-CM | POA: Diagnosis not present

## 2016-01-28 LAB — COMPREHENSIVE METABOLIC PANEL
ALT: 32 U/L (ref 14–54)
AST: 19 U/L (ref 15–41)
Albumin: 4.2 g/dL (ref 3.5–5.0)
Alkaline Phosphatase: 76 U/L (ref 38–126)
Anion gap: 8 (ref 5–15)
BUN: 10 mg/dL (ref 6–20)
CO2: 26 mmol/L (ref 22–32)
Calcium: 9.3 mg/dL (ref 8.9–10.3)
Chloride: 106 mmol/L (ref 101–111)
Creatinine, Ser: 0.52 mg/dL (ref 0.44–1.00)
GFR calc Af Amer: >60 mL/min (ref >60)
GFR calc non Af Amer: >60 mL/min (ref >60)
Glucose, Bld: 80 mg/dL (ref 65–99)
Potassium: 3.7 mmol/L (ref 3.5–5.1)
Sodium: 140 mmol/L (ref 135–145)
Total Bilirubin: 0.6 mg/dL (ref 0.3–1.2)
Total Protein: 7.4 g/dL (ref 6.5–8.1)

## 2016-01-28 LAB — CBC WITH DIFFERENTIAL/PLATELET
Basophils Absolute: 0 10*3/uL (ref 0.0–0.1)
Basophils Relative: 0 %
Eosinophils Absolute: 0.1 10*3/uL (ref 0.0–0.7)
Eosinophils Relative: 2 %
HCT: 42.3 % (ref 36.0–46.0)
Hemoglobin: 14.1 g/dL (ref 12.0–15.0)
Lymphocytes Relative: 24 %
Lymphs Abs: 1.8 10*3/uL (ref 0.7–4.0)
MCH: 30.6 pg (ref 26.0–34.0)
MCHC: 33.3 g/dL (ref 30.0–36.0)
MCV: 91.8 fL (ref 78.0–100.0)
Monocytes Absolute: 0.7 10*3/uL (ref 0.1–1.0)
Monocytes Relative: 10 %
Neutro Abs: 4.6 10*3/uL (ref 1.7–7.7)
Neutrophils Relative %: 64 %
Platelets: 201 10*3/uL (ref 150–400)
RBC: 4.61 MIL/uL (ref 3.87–5.11)
RDW: 13.2 % (ref 11.5–15.5)
WBC: 7.3 10*3/uL (ref 4.0–10.5)

## 2016-01-28 LAB — URINALYSIS, ROUTINE W REFLEX MICROSCOPIC
Bilirubin Urine: NEGATIVE
Glucose, UA: NEGATIVE mg/dL
Hgb urine dipstick: NEGATIVE
Ketones, ur: NEGATIVE mg/dL
Leukocytes, UA: NEGATIVE
Nitrite: NEGATIVE
Protein, ur: NEGATIVE mg/dL
Specific Gravity, Urine: 1.02 (ref 1.005–1.030)
pH: 6 (ref 5.0–8.0)

## 2016-01-28 LAB — LIPASE, BLOOD: Lipase: 25 U/L (ref 11–51)

## 2016-01-28 MED ORDER — ONDANSETRON 4 MG PO TBDP
4.0000 mg | ORAL_TABLET | Freq: Once | ORAL | Status: AC
  Administered 2016-01-28: 4 mg via ORAL
  Filled 2016-01-28: qty 1

## 2016-01-28 MED ORDER — ONDANSETRON 4 MG PO TBDP: 4.0000 mg | ORAL_TABLET | ORAL | Status: DC | PRN
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
## 2016-01-28 NOTE — ED Notes (Signed)
Vomiting since coming home from her doctors appointment today.

## 2016-01-28 NOTE — Discharge Instructions (Signed)

## 2016-01-28 NOTE — ED Notes (Signed)
Pt reports N/V following her cardiology check up today around noon. Pt has not vomited since she arrived here but states she feels like she needs to. Pt c/o lower mid abd pain. Pt denies urinary symptoms.

## 2016-02-02 ENCOUNTER — Encounter: Payer: Self-pay | Admitting: Family Medicine

## 2016-02-04 DIAGNOSIS — Z79899 Other long term (current) drug therapy: Secondary | ICD-10-CM | POA: Diagnosis not present

## 2016-02-04 DIAGNOSIS — E860 Dehydration: Secondary | ICD-10-CM | POA: Diagnosis not present

## 2016-02-04 DIAGNOSIS — I10 Essential (primary) hypertension: Secondary | ICD-10-CM | POA: Diagnosis not present

## 2016-02-04 DIAGNOSIS — Z87891 Personal history of nicotine dependence: Secondary | ICD-10-CM | POA: Diagnosis not present

## 2016-02-04 DIAGNOSIS — T63461A Toxic effect of venom of wasps, accidental (unintentional), initial encounter: Secondary | ICD-10-CM | POA: Diagnosis not present

## 2016-02-04 DIAGNOSIS — J45909 Unspecified asthma, uncomplicated: Secondary | ICD-10-CM | POA: Diagnosis not present

## 2016-02-10 ENCOUNTER — Ambulatory Visit: Payer: Self-pay | Admitting: Family Medicine

## 2016-02-11 ENCOUNTER — Emergency Department (HOSPITAL_BASED_OUTPATIENT_CLINIC_OR_DEPARTMENT_OTHER): Payer: Medicare Other

## 2016-02-11 ENCOUNTER — Encounter (HOSPITAL_BASED_OUTPATIENT_CLINIC_OR_DEPARTMENT_OTHER): Payer: Self-pay | Admitting: *Deleted

## 2016-02-11 ENCOUNTER — Emergency Department (HOSPITAL_BASED_OUTPATIENT_CLINIC_OR_DEPARTMENT_OTHER)
Admission: EM | Admit: 2016-02-11 | Discharge: 2016-02-11 | Disposition: A | Discharge status: Home / Self Care | Payer: Medicare Other | Attending: Emergency Medicine | Admitting: Emergency Medicine

## 2016-02-11 DIAGNOSIS — Z87891 Personal history of nicotine dependence: Secondary | ICD-10-CM | POA: Insufficient documentation

## 2016-02-11 DIAGNOSIS — R682 Dry mouth, unspecified: Secondary | ICD-10-CM

## 2016-02-11 DIAGNOSIS — R0981 Nasal congestion: Secondary | ICD-10-CM | POA: Diagnosis not present

## 2016-02-11 DIAGNOSIS — Z8542 Personal history of malignant neoplasm of other parts of uterus: Secondary | ICD-10-CM | POA: Diagnosis not present

## 2016-02-11 DIAGNOSIS — R5383 Other fatigue: Secondary | ICD-10-CM | POA: Diagnosis not present

## 2016-02-11 DIAGNOSIS — M79646 Pain in unspecified finger(s): Secondary | ICD-10-CM | POA: Diagnosis not present

## 2016-02-11 DIAGNOSIS — R002 Palpitations: Secondary | ICD-10-CM | POA: Diagnosis not present

## 2016-02-11 DIAGNOSIS — M79602 Pain in left arm: Secondary | ICD-10-CM

## 2016-02-11 DIAGNOSIS — Z79899 Other long term (current) drug therapy: Secondary | ICD-10-CM | POA: Diagnosis not present

## 2016-02-11 DIAGNOSIS — E785 Hyperlipidemia, unspecified: Secondary | ICD-10-CM | POA: Insufficient documentation

## 2016-02-11 DIAGNOSIS — R42 Dizziness and giddiness: Secondary | ICD-10-CM | POA: Diagnosis not present

## 2016-02-11 DIAGNOSIS — M79622 Pain in left upper arm: Secondary | ICD-10-CM | POA: Diagnosis not present

## 2016-02-11 DIAGNOSIS — D509 Iron deficiency anemia, unspecified: Secondary | ICD-10-CM | POA: Diagnosis not present

## 2016-02-11 DIAGNOSIS — G35 Multiple sclerosis: Secondary | ICD-10-CM | POA: Diagnosis not present

## 2016-02-11 DIAGNOSIS — J45909 Unspecified asthma, uncomplicated: Secondary | ICD-10-CM | POA: Diagnosis not present

## 2016-02-11 DIAGNOSIS — R1084 Generalized abdominal pain: Secondary | ICD-10-CM | POA: Diagnosis not present

## 2016-02-11 DIAGNOSIS — R5381 Other malaise: Secondary | ICD-10-CM | POA: Diagnosis not present

## 2016-02-11 DIAGNOSIS — I10 Essential (primary) hypertension: Secondary | ICD-10-CM | POA: Diagnosis not present

## 2016-02-11 DIAGNOSIS — R110 Nausea: Secondary | ICD-10-CM | POA: Diagnosis not present

## 2016-02-11 DIAGNOSIS — R0602 Shortness of breath: Secondary | ICD-10-CM | POA: Diagnosis not present

## 2016-02-11 LAB — COMPREHENSIVE METABOLIC PANEL
ALT: 30 U/L (ref 14–54)
AST: 16 U/L (ref 15–41)
Albumin: 3.9 g/dL (ref 3.5–5.0)
Alkaline Phosphatase: 79 U/L (ref 38–126)
Anion gap: 7 (ref 5–15)
BUN: 11 mg/dL (ref 6–20)
CO2: 24 mmol/L (ref 22–32)
Calcium: 8.9 mg/dL (ref 8.9–10.3)
Chloride: 109 mmol/L (ref 101–111)
Creatinine, Ser: 0.51 mg/dL (ref 0.44–1.00)
GFR calc Af Amer: >60 mL/min (ref >60)
GFR calc non Af Amer: >60 mL/min (ref >60)
Glucose, Bld: 90 mg/dL (ref 65–99)
Potassium: 3.4 mmol/L — ABNORMAL LOW (ref 3.5–5.1)
Sodium: 140 mmol/L (ref 135–145)
Total Bilirubin: 0.7 mg/dL (ref 0.3–1.2)
Total Protein: 6.9 g/dL (ref 6.5–8.1)

## 2016-02-11 LAB — TROPONIN I: Troponin I: <0.03 ng/mL (ref <0.03)

## 2016-02-11 LAB — CBC WITH DIFFERENTIAL/PLATELET
Basophils Absolute: 0 10*3/uL (ref 0.0–0.1)
Basophils Relative: 0 %
Eosinophils Absolute: 0.1 10*3/uL (ref 0.0–0.7)
Eosinophils Relative: 2 %
HCT: 42.1 % (ref 36.0–46.0)
Hemoglobin: 13.9 g/dL (ref 12.0–15.0)
Lymphocytes Relative: 24 %
Lymphs Abs: 1.6 10*3/uL (ref 0.7–4.0)
MCH: 30.4 pg (ref 26.0–34.0)
MCHC: 33 g/dL (ref 30.0–36.0)
MCV: 92.1 fL (ref 78.0–100.0)
Monocytes Absolute: 0.5 10*3/uL (ref 0.1–1.0)
Monocytes Relative: 8 %
Neutro Abs: 4.3 10*3/uL (ref 1.7–7.7)
Neutrophils Relative %: 66 %
Platelets: 193 10*3/uL (ref 150–400)
RBC: 4.57 MIL/uL (ref 3.87–5.11)
RDW: 13.2 % (ref 11.5–15.5)
WBC: 6.5 10*3/uL (ref 4.0–10.5)

## 2016-02-11 LAB — URINALYSIS, ROUTINE W REFLEX MICROSCOPIC
Bilirubin Urine: NEGATIVE
Glucose, UA: NEGATIVE mg/dL
Hgb urine dipstick: NEGATIVE
Ketones, ur: NEGATIVE mg/dL
Leukocytes, UA: NEGATIVE
Nitrite: NEGATIVE
Protein, ur: NEGATIVE mg/dL
Specific Gravity, Urine: 1.02 (ref 1.005–1.030)
pH: 5.5 (ref 5.0–8.0)

## 2016-02-11 LAB — LIPASE, BLOOD: Lipase: 22 U/L (ref 11–51)

## 2016-02-11 MED ORDER — POTASSIUM CHLORIDE 20 MEQ/15ML (10%) PO SOLN
40.0000 meq | Freq: Once | ORAL | Status: AC
  Administered 2016-02-11: 40 meq via ORAL
  Filled 2016-02-11: qty 30

## 2016-02-11 MED ORDER — POTASSIUM CHLORIDE CRYS ER 20 MEQ PO TBCR: 40.0000 meq | EXTENDED_RELEASE_TABLET | Freq: Once | ORAL | Status: DC

## 2016-02-11 NOTE — ED Notes (Addendum)
Pt has care plan. Pt yelling at family member on the phone prior to assessment. Pt reports shooting pain down lt arm from shoulder to finger tips- describes shooting from wrist down to finger tips and chronic ache to lt shoulder. Abdominal pain, dry mouth. Multiple complaints. Insomnia, reports a head pressure and took blood pressure prior to taking BP medication and lower number 133. Sweating and reports ongoing fatigue. Was unable to go to endocrinologist doctors appointment was sent there by cardiologist. Also reports not having her "weekly labs" in two week that she gets at [facility] .

## 2016-02-11 NOTE — ED Notes (Signed)
States she feels bad. Pain in both arms but mostly left. She felt SOB last night. Her heart was fluttering during the SOB. Abdominal pain. Her tongue and mouth are parched. Her voice feels raspy. This am her BP at home was elevated. States she has been on the same BP medication for a while.
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
## 2016-02-11 NOTE — ED Notes (Signed)
PT on automatic VS

## 2016-02-12 DIAGNOSIS — J45909 Unspecified asthma, uncomplicated: Secondary | ICD-10-CM | POA: Diagnosis not present

## 2016-02-12 DIAGNOSIS — I10 Essential (primary) hypertension: Secondary | ICD-10-CM | POA: Diagnosis not present

## 2016-02-12 DIAGNOSIS — K146 Glossodynia: Secondary | ICD-10-CM | POA: Diagnosis not present

## 2016-02-12 DIAGNOSIS — Z87891 Personal history of nicotine dependence: Secondary | ICD-10-CM | POA: Diagnosis not present

## 2016-02-12 DIAGNOSIS — Z79899 Other long term (current) drug therapy: Secondary | ICD-10-CM | POA: Diagnosis not present

## 2016-02-17 IMAGING — US US EXTREM  UP VENOUS*R*
1 series · 13 of 24 positions shown · non-contrast
Comparison: None.

CLINICAL DATA: Acute right shoulder pain for 1 day.



[Series 1: us extrem up venous*right* · 0.08mm/px · 13 of 34 slices shown]
[im 1/34]
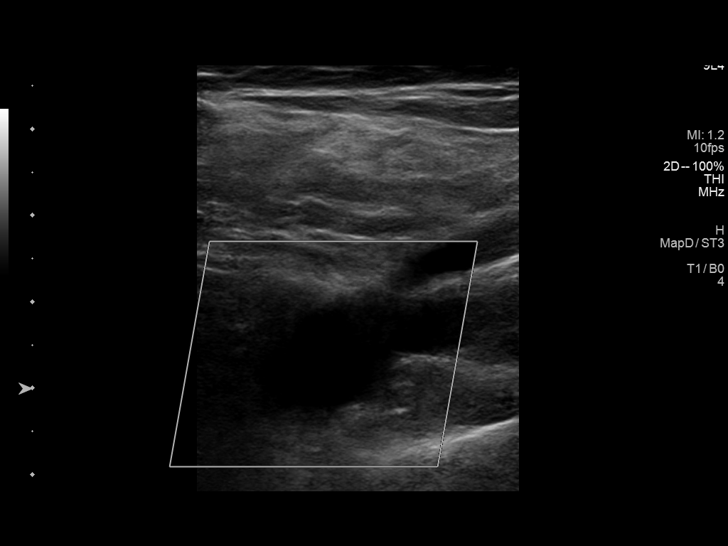
[im 3/34]
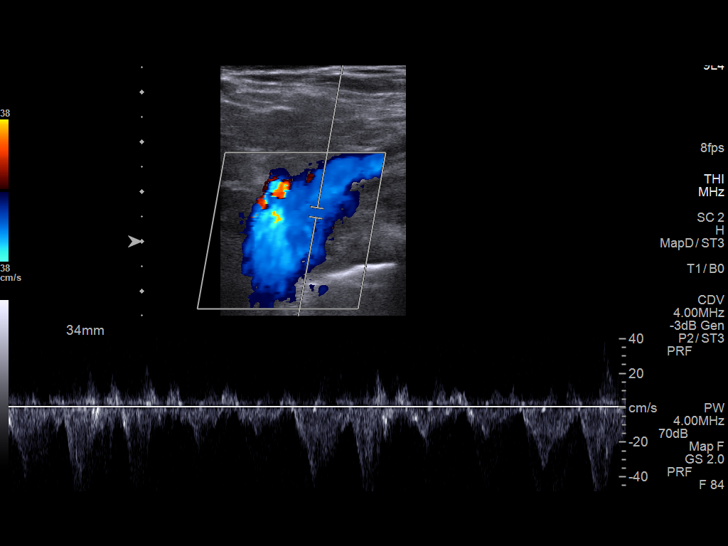
[im 6/34]
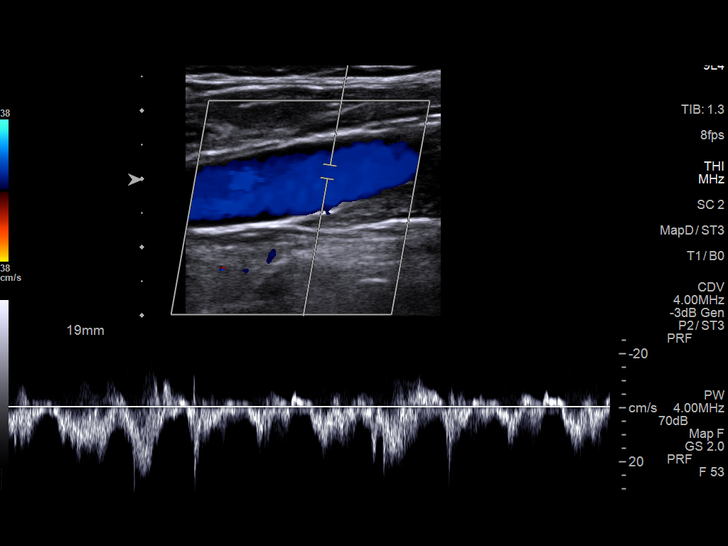
[im 9/34]
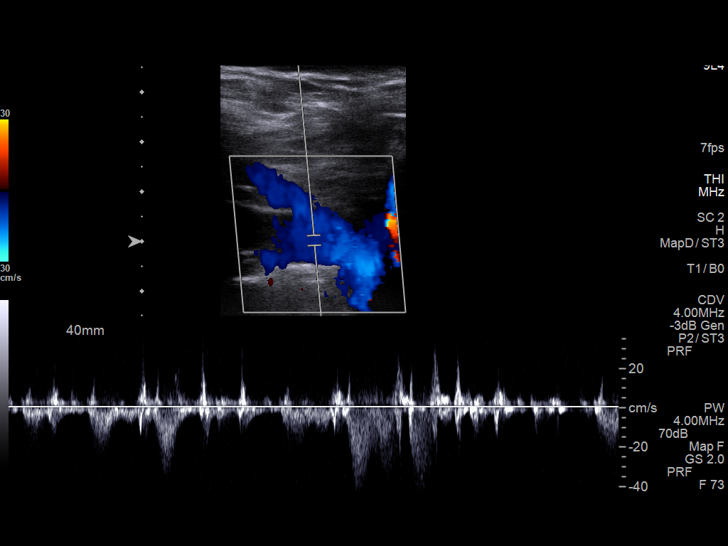
[im 12/34]
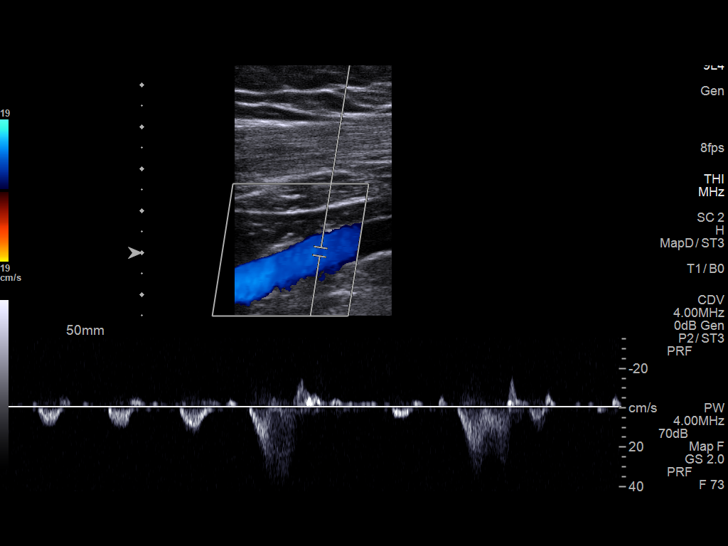
[im 15/34]
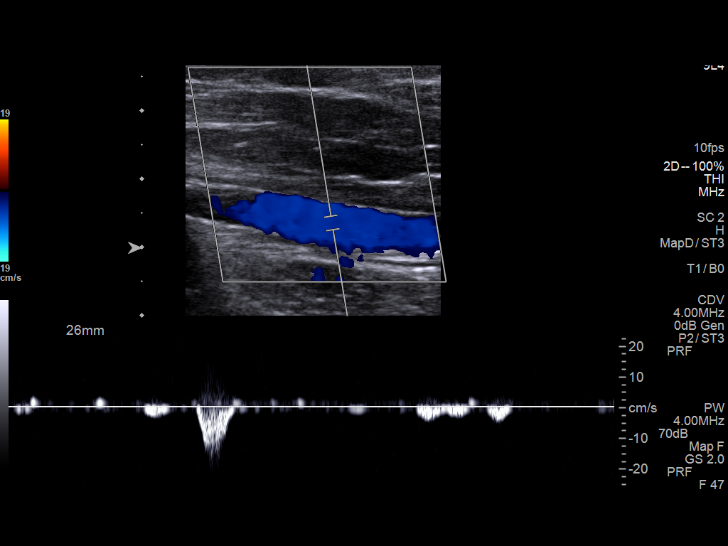
[im 18/34]
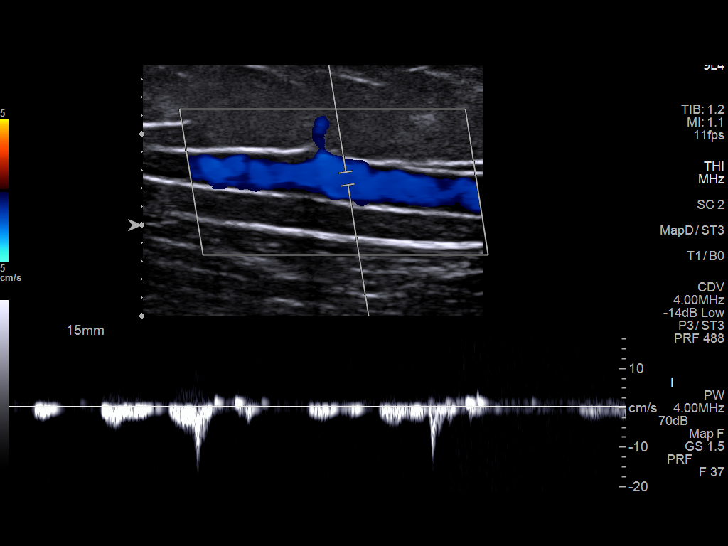
[im 19/34]
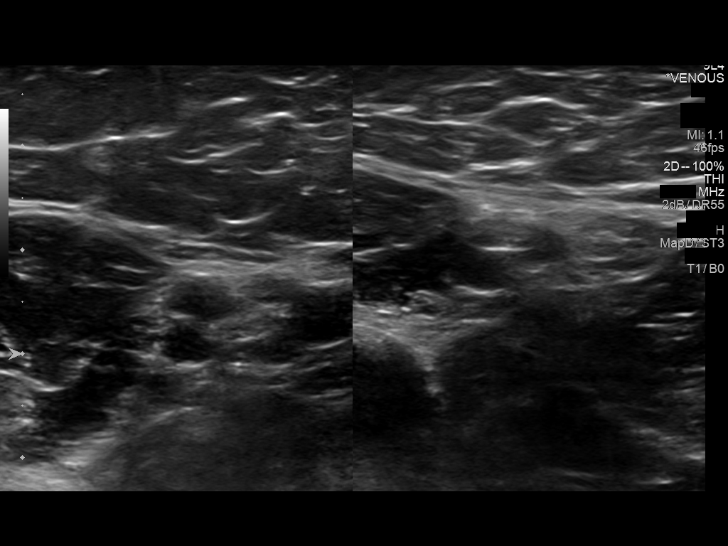
[im 22/34]
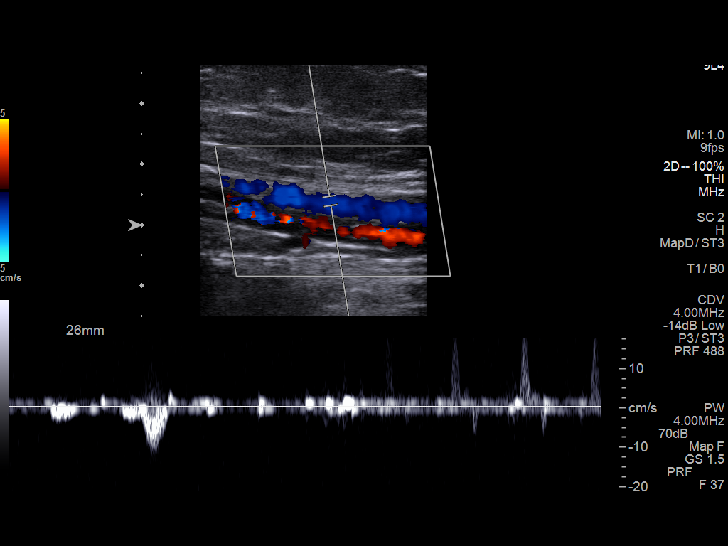
[im 25/34]
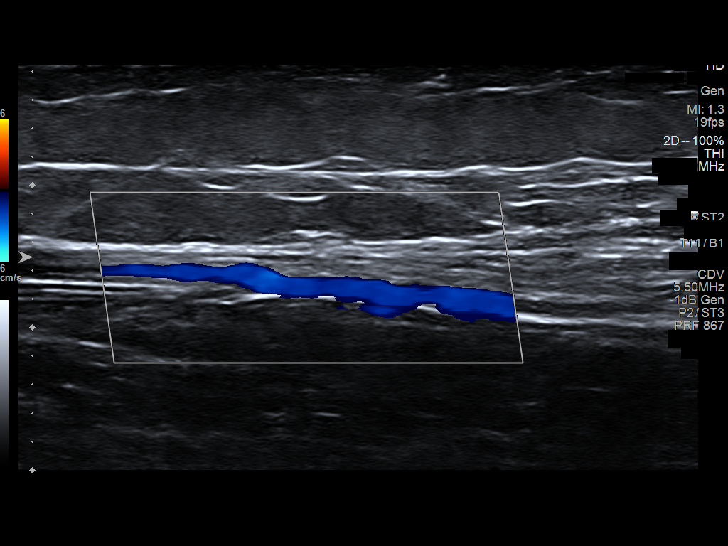
[im 28/34]
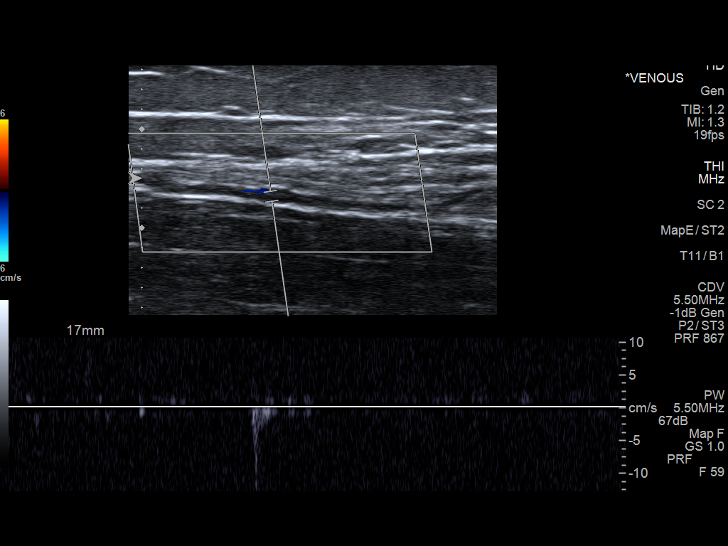
[im 31/34]
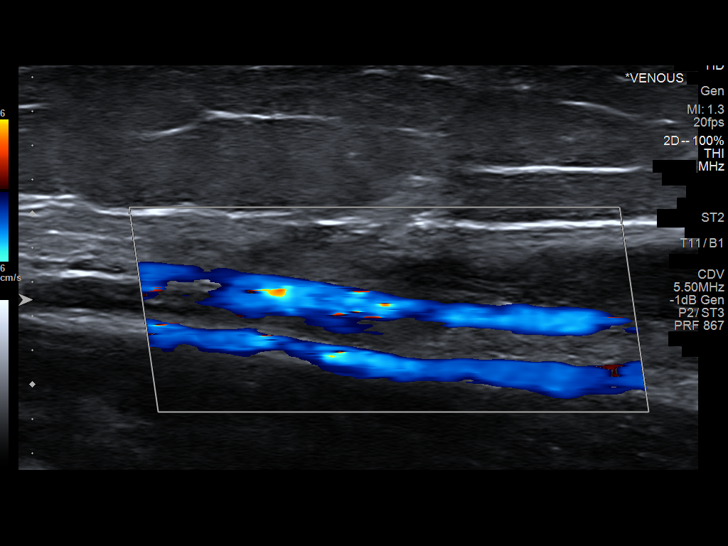
[im 34/34]
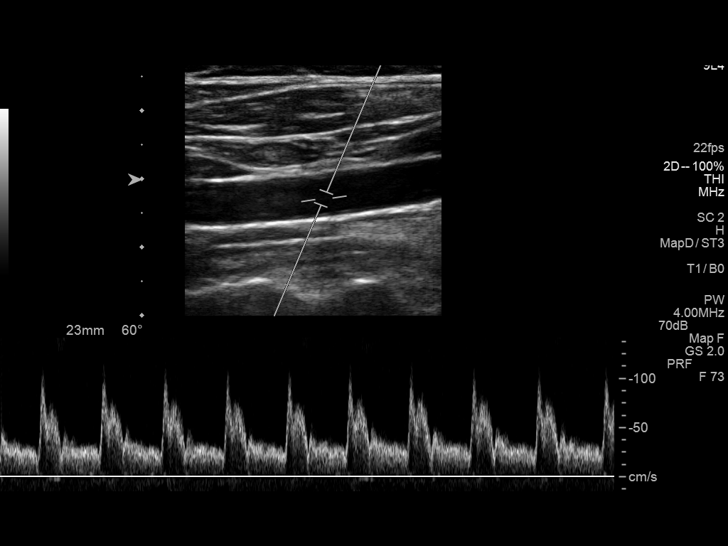

[13 of 24 positions shown; findings below may reference images not displayed]

FINDINGS: Contralateral Subclavian Vein: Respiratory phasicity is normal and
symmetric with the symptomatic side. No evidence of thrombus. Normal
compressibility.

Internal Jugular Vein: No evidence of thrombus. Normal
compressibility, respiratory phasicity and response to augmentation.

Subclavian Vein: No evidence of thrombus. Normal compressibility,
respiratory phasicity and response to augmentation.

Axillary Vein: No evidence of thrombus. Normal compressibility,
respiratory phasicity and response to augmentation.

Cephalic Vein: No evidence of thrombus. Normal compressibility,
respiratory phasicity and response to augmentation.

Basilic Vein: No evidence of thrombus. Normal compressibility,
respiratory phasicity and response to augmentation.

Brachial Veins: No evidence of thrombus. Normal compressibility,
respiratory phasicity and response to augmentation.

Radial Veins: No evidence of thrombus. Normal compressibility,
respiratory phasicity and response to augmentation.

Ulnar Veins: No evidence of thrombus. Normal compressibility,
respiratory phasicity and response to augmentation.

Venous Reflux:  None visualized.

Other Findings:  None visualized.
IMPRESSION: No evidence of deep venous thrombosis seen in right upper extremity.

## 2016-02-18 DIAGNOSIS — S86911A Strain of unspecified muscle(s) and tendon(s) at lower leg level, right leg, initial encounter: Secondary | ICD-10-CM | POA: Diagnosis not present

## 2016-02-18 DIAGNOSIS — I10 Essential (primary) hypertension: Secondary | ICD-10-CM | POA: Diagnosis not present

## 2016-02-18 DIAGNOSIS — M25561 Pain in right knee: Secondary | ICD-10-CM | POA: Diagnosis not present

## 2016-02-18 DIAGNOSIS — Z87891 Personal history of nicotine dependence: Secondary | ICD-10-CM | POA: Diagnosis not present

## 2016-02-18 DIAGNOSIS — Z886 Allergy status to analgesic agent status: Secondary | ICD-10-CM | POA: Diagnosis not present

## 2016-02-18 DIAGNOSIS — Z885 Allergy status to narcotic agent status: Secondary | ICD-10-CM | POA: Diagnosis not present

## 2016-02-18 DIAGNOSIS — J45909 Unspecified asthma, uncomplicated: Secondary | ICD-10-CM | POA: Diagnosis not present

## 2016-02-18 DIAGNOSIS — S86811A Strain of other muscle(s) and tendon(s) at lower leg level, right leg, initial encounter: Secondary | ICD-10-CM | POA: Diagnosis not present

## 2016-02-19 DIAGNOSIS — M25561 Pain in right knee: Secondary | ICD-10-CM | POA: Diagnosis not present

## 2016-02-22 ENCOUNTER — Ambulatory Visit (INDEPENDENT_AMBULATORY_CARE_PROVIDER_SITE_OTHER): Payer: Medicare Other | Admitting: Family Medicine

## 2016-02-22 ENCOUNTER — Encounter: Payer: Self-pay | Admitting: Family Medicine

## 2016-02-22 VITALS — BP 126/75 | HR 91 | Wt 207.0 lb

## 2016-02-22 DIAGNOSIS — I10 Essential (primary) hypertension: Secondary | ICD-10-CM | POA: Diagnosis not present

## 2016-02-22 DIAGNOSIS — Z79890 Hormone replacement therapy (postmenopausal): Secondary | ICD-10-CM | POA: Diagnosis not present

## 2016-02-22 DIAGNOSIS — R7301 Impaired fasting glucose: Secondary | ICD-10-CM

## 2016-02-22 DIAGNOSIS — R7309 Other abnormal glucose: Secondary | ICD-10-CM

## 2016-02-22 DIAGNOSIS — E876 Hypokalemia: Secondary | ICD-10-CM | POA: Diagnosis not present

## 2016-02-22 DIAGNOSIS — M25561 Pain in right knee: Secondary | ICD-10-CM | POA: Diagnosis not present

## 2016-02-22 LAB — BASIC METABOLIC PANEL
BUN: 14 mg/dL (ref 4–21)
Creatinine: 0.6 mg/dL (ref <=1.1)
Glucose: 131 mg/dL
Potassium: 4 mmol/L (ref 3.4–5.3)
Sodium: 145 mmol/L (ref 137–147)

## 2016-02-22 LAB — POCT GLYCOSYLATED HEMOGLOBIN (HGB A1C): Hemoglobin A1C: 5.7

## 2016-02-22 MED ORDER — ESTRADIOL 0.0375 MG/24HR TD PTTW: 1.0000 | MEDICATED_PATCH | TRANSDERMAL | Status: DC

## 2016-02-22 NOTE — Progress Notes (Signed)
Subjective:    CC: HTN  HPI:  HTN - she is back on the labetolol.  She says she starting to feel more short of breath at times and feeling like a heaviness in her arms and legs that occurs more in the evening times. She says this is what it felt like when she first started labetalol and when she's been on other beta blockers in the past. She denies any chest pain that she's having some pain radiating towards her shoulder blade.  IFG - No inc thirst or urination.   Right knee pain - she say she has had problems with her right knee on and off.  More recently she had bit down to clean up the litter box and when she tried to stand back up she felt a popping and tearing sensation in her right knee. It has been tender and swelling on and off since then. She went to the emergency department and they had recommended a knee brace for support and to rest and elevate it. She says the brace they gave her Sliding off and so she just got rid of it because it wouldn't stay on.  Wanting to go on HRT. She feels like is having mood swing, sleep issues. She is having a hard time losing weight. Some hot flashes as well. She also has a hard time swallowing pills.    Past medical history, Surgical history, Family history not pertinant except as noted below, Social history, Allergies, and medications have been entered into the medical record, reviewed, and corrections made.   Review of Systems: No fevers, chills, night sweats, weight loss, chest pain, or shortness of breath.   Objective:    General: Well Developed, well nourished, and in no acute distress.  Neuro: Alert and oriented x3, extra-ocular muscles intact, sensation grossly intact.  HEENT: Normocephalic, atraumatic  Skin: Warm and dry, no rashes. Cardiac: Regular rate and rhythm, no murmurs rubs or gallops, no lower extremity edema.  Respiratory: Clear to auscultation bilaterally. Not using accessory muscles, speaking in full sentences.   Impression  and Recommendations:   Hypertension-continue labetalol. Repeat blood pressure looks fantastic today. Encouraged her to stick with the regimen for now. I'm not really convinced that shortness of breath and heaviness in the arms is actually the cause.  Right knee pain - fitted for knee brace today. Follow-up in a couple of weeks with one of our sports medicine providers.  HRT - restart HRT for insomnia, fatigue, mood swings, etc.  we'll start with Vivelle dot. Hopefully this will be covered by her insurance. One about potential side effects including increased risk for breast cancer. She has had a hysterectomy. She will need to get her mammograms done yearly. She'll be due in October again.  IFG - improved from previous.  Continue work on diet and exercise. Recheck in 6-9 months.  Lab Results  Component Value Date   HGBA1C 5.7 02/22/2016

## 2016-02-22 NOTE — Patient Instructions (Signed)
Recommend see sports medicine in about 2 weeks.

## 2016-02-23 ENCOUNTER — Telehealth: Payer: Self-pay | Admitting: Family Medicine

## 2016-02-23 DIAGNOSIS — M25579 Pain in unspecified ankle and joints of unspecified foot: Secondary | ICD-10-CM

## 2016-02-23 NOTE — Telephone Encounter (Signed)
Please call patient and let her know that her BMP looked normal.

## 2016-02-23 NOTE — Telephone Encounter (Signed)
Pt informed.Jennifer [name], Jennifer [name] Pt would like podiatry referral for downstairs. Referral sent.Jennifer [name]

## 2016-02-24 DIAGNOSIS — Z885 Allergy status to narcotic agent status: Secondary | ICD-10-CM | POA: Diagnosis not present

## 2016-02-24 DIAGNOSIS — Z886 Allergy status to analgesic agent status: Secondary | ICD-10-CM | POA: Diagnosis not present

## 2016-02-24 DIAGNOSIS — R0602 Shortness of breath: Secondary | ICD-10-CM | POA: Diagnosis not present

## 2016-02-24 DIAGNOSIS — S301XXA Contusion of abdominal wall, initial encounter: Secondary | ICD-10-CM | POA: Diagnosis not present

## 2016-02-24 DIAGNOSIS — R110 Nausea: Secondary | ICD-10-CM | POA: Diagnosis not present

## 2016-02-24 DIAGNOSIS — S3992XA Unspecified injury of lower back, initial encounter: Secondary | ICD-10-CM | POA: Diagnosis not present

## 2016-02-24 DIAGNOSIS — Z888 Allergy status to other drugs, medicaments and biological substances status: Secondary | ICD-10-CM | POA: Diagnosis not present

## 2016-02-24 DIAGNOSIS — R102 Pelvic and perineal pain: Secondary | ICD-10-CM | POA: Diagnosis not present

## 2016-02-24 DIAGNOSIS — Z8249 Family history of ischemic heart disease and other diseases of the circulatory system: Secondary | ICD-10-CM | POA: Diagnosis not present

## 2016-02-24 DIAGNOSIS — D649 Anemia, unspecified: Secondary | ICD-10-CM | POA: Diagnosis not present

## 2016-02-24 DIAGNOSIS — S3991XA Unspecified injury of abdomen, initial encounter: Secondary | ICD-10-CM | POA: Diagnosis not present

## 2016-02-24 DIAGNOSIS — I10 Essential (primary) hypertension: Secondary | ICD-10-CM | POA: Diagnosis not present

## 2016-02-24 DIAGNOSIS — M545 Low back pain: Secondary | ICD-10-CM | POA: Diagnosis not present

## 2016-02-24 DIAGNOSIS — R1900 Intra-abdominal and pelvic swelling, mass and lump, unspecified site: Secondary | ICD-10-CM | POA: Diagnosis not present

## 2016-02-24 DIAGNOSIS — Z87891 Personal history of nicotine dependence: Secondary | ICD-10-CM | POA: Diagnosis not present

## 2016-02-24 DIAGNOSIS — M79672 Pain in left foot: Secondary | ICD-10-CM | POA: Diagnosis not present

## 2016-02-24 DIAGNOSIS — G35 Multiple sclerosis: Secondary | ICD-10-CM | POA: Diagnosis not present

## 2016-02-25 DIAGNOSIS — S3991XA Unspecified injury of abdomen, initial encounter: Secondary | ICD-10-CM | POA: Diagnosis not present

## 2016-02-25 DIAGNOSIS — I10 Essential (primary) hypertension: Secondary | ICD-10-CM | POA: Diagnosis not present

## 2016-02-25 DIAGNOSIS — S3992XA Unspecified injury of lower back, initial encounter: Secondary | ICD-10-CM | POA: Diagnosis not present

## 2016-02-26 DIAGNOSIS — R102 Pelvic and perineal pain: Secondary | ICD-10-CM | POA: Diagnosis not present

## 2016-02-26 DIAGNOSIS — Z8249 Family history of ischemic heart disease and other diseases of the circulatory system: Secondary | ICD-10-CM | POA: Diagnosis not present

## 2016-02-26 DIAGNOSIS — R109 Unspecified abdominal pain: Secondary | ICD-10-CM | POA: Diagnosis not present

## 2016-02-26 DIAGNOSIS — Z885 Allergy status to narcotic agent status: Secondary | ICD-10-CM | POA: Diagnosis not present

## 2016-02-26 DIAGNOSIS — M545 Low back pain: Secondary | ICD-10-CM | POA: Diagnosis not present

## 2016-02-26 DIAGNOSIS — R0602 Shortness of breath: Secondary | ICD-10-CM | POA: Diagnosis not present

## 2016-02-26 DIAGNOSIS — G35 Multiple sclerosis: Secondary | ICD-10-CM | POA: Diagnosis not present

## 2016-02-26 DIAGNOSIS — D649 Anemia, unspecified: Secondary | ICD-10-CM | POA: Diagnosis not present

## 2016-02-26 DIAGNOSIS — Z87891 Personal history of nicotine dependence: Secondary | ICD-10-CM | POA: Diagnosis not present

## 2016-02-26 DIAGNOSIS — Z886 Allergy status to analgesic agent status: Secondary | ICD-10-CM | POA: Diagnosis not present

## 2016-02-26 DIAGNOSIS — S301XXA Contusion of abdominal wall, initial encounter: Secondary | ICD-10-CM | POA: Diagnosis not present

## 2016-02-26 DIAGNOSIS — Z888 Allergy status to other drugs, medicaments and biological substances status: Secondary | ICD-10-CM | POA: Diagnosis not present

## 2016-02-26 DIAGNOSIS — K429 Umbilical hernia without obstruction or gangrene: Secondary | ICD-10-CM | POA: Diagnosis not present

## 2016-02-26 DIAGNOSIS — I10 Essential (primary) hypertension: Secondary | ICD-10-CM | POA: Diagnosis not present

## 2016-02-26 DIAGNOSIS — M47819 Spondylosis without myelopathy or radiculopathy, site unspecified: Secondary | ICD-10-CM | POA: Diagnosis not present

## 2016-02-26 DIAGNOSIS — K5900 Constipation, unspecified: Secondary | ICD-10-CM | POA: Diagnosis not present

## 2016-02-27 DIAGNOSIS — R109 Unspecified abdominal pain: Secondary | ICD-10-CM | POA: Diagnosis not present

## 2016-02-29 ENCOUNTER — Encounter (HOSPITAL_BASED_OUTPATIENT_CLINIC_OR_DEPARTMENT_OTHER): Payer: Self-pay | Admitting: *Deleted

## 2016-02-29 ENCOUNTER — Emergency Department (HOSPITAL_BASED_OUTPATIENT_CLINIC_OR_DEPARTMENT_OTHER)
Admission: EM | Admit: 2016-02-29 | Discharge: 2016-02-29 | Disposition: A | Discharge status: Home / Self Care | Payer: Medicare Other | Attending: Emergency Medicine | Admitting: Emergency Medicine

## 2016-02-29 ENCOUNTER — Telehealth: Payer: Self-pay | Admitting: Family Medicine

## 2016-02-29 DIAGNOSIS — R21 Rash and other nonspecific skin eruption: Secondary | ICD-10-CM | POA: Diagnosis present

## 2016-02-29 DIAGNOSIS — Z8542 Personal history of malignant neoplasm of other parts of uterus: Secondary | ICD-10-CM | POA: Diagnosis not present

## 2016-02-29 DIAGNOSIS — F329 Major depressive disorder, single episode, unspecified: Secondary | ICD-10-CM | POA: Insufficient documentation

## 2016-02-29 DIAGNOSIS — J45909 Unspecified asthma, uncomplicated: Secondary | ICD-10-CM | POA: Insufficient documentation

## 2016-02-29 DIAGNOSIS — Z87891 Personal history of nicotine dependence: Secondary | ICD-10-CM | POA: Insufficient documentation

## 2016-02-29 DIAGNOSIS — E785 Hyperlipidemia, unspecified: Secondary | ICD-10-CM | POA: Insufficient documentation

## 2016-02-29 DIAGNOSIS — I10 Essential (primary) hypertension: Secondary | ICD-10-CM | POA: Diagnosis not present

## 2016-02-29 DIAGNOSIS — N952 Postmenopausal atrophic vaginitis: Secondary | ICD-10-CM | POA: Diagnosis not present

## 2016-02-29 MED ORDER — BENZOCAINE-RESORCINOL 5-2 % VA CREA: TOPICAL_CREAM | Freq: Every day | VAGINAL | Status: DC

## 2016-02-29 NOTE — ED Notes (Signed)
Pelvic cart set up per PA request. Pt states "I don't need all those swabs done and I'm going to be pissed if she does them." PA made aware.

## 2016-02-29 NOTE — Telephone Encounter (Signed)
Pt called clinic today stating she has a "female issue" that cannot wait until her appointment. Pt reports she has "redness, pain, and burning on the lips around her vagina." Pt reports no discharge, no itching, no urinary symptoms. Pt states she "has not had sex" so is not worried about potential STD. Pt reports no change in laundry detergent, soap, or lotion. Will route to PCP to see if anything can be sent to the pharmacy prior to her appointment next week.

## 2016-02-29 NOTE — ED Notes (Signed)
States she had a fall last week and was seen at [facility] regional for bruising. She had a negative CT of her abdomen. She is her today for burning and redness on her pubic area. She does not want to see a female doctor. Only a female PA or NP

## 2016-02-29 NOTE — ED Notes (Addendum)
Pt states she was just at [facility] 7/15 and had CT scan there.

## 2016-02-29 NOTE — Telephone Encounter (Signed)
Dr. Sheppard Coil out of office this week. Left VM to schedule an OV this week for Pt.

## 2016-02-29 NOTE — Telephone Encounter (Signed)
Call pt: Ok to put on schedule for tomorrow for acute issues. Sounds like a yeast infection. Can put on with Lanelle Bal since I am booked.

## 2016-02-29 NOTE — Discharge Instructions (Signed)
Atrophic Vaginitis Atrophic vaginitis is a condition in which the tissues that line the vagina become dry and thin. This condition is most common in women who have stopped having regular menstrual periods (menopause). This usually starts when a woman is 63-50 years old. Estrogen helps to keep the vagina moist. It stimulates the vagina to produce a clear fluid that lubricates the vagina for sexual intercourse. This fluid also protects the vagina from infection. Lack of estrogen can cause the lining of the vagina to get thinner and dryer. The vagina may also shrink in size. It may become less elastic. Atrophic vaginitis tends to get worse over time as a woman's estrogen level drops. CAUSES This condition is caused by the normal drop in estrogen that happens around the time of menopause. RISK FACTORS Certain conditions or situations may lower a woman's estrogen level, which increases her risk of atrophic vaginitis. These include:  Taking medicine that blocks estrogen.  Having ovaries removed surgically.  Being treated for cancer with X-ray treatment (radiation) or medicines (chemotherapy).  Exercising very hard and often.  Having an eating disorder (anorexia).  Giving birth or breastfeeding.  Being over the age of 28.  Smoking. SYMPTOMS Symptoms of this condition include:  Pain, soreness, or bleeding during sexual intercourse (dyspareunia).  Vaginal burning, irritation, or itching.  Pain or bleeding during a vaginal examination using a speculum (pelvic exam).  Loss of interest in sexual activity.  Having burning pain when passing urine.  Vaginal discharge that is brown or yellow. In some cases, there are no symptoms. DIAGNOSIS This condition is diagnosed with a medical history and physical exam. This will include a pelvic exam that checks whether the inside of your vagina appears pale, thin, or dry. Rarely, you may also have other tests, including:  A urine test.  A test that  checks the acid balance in your vaginal fluid (acid balance test). TREATMENT Treatment for this condition may depend on the severity of your symptoms. Treatment may include:  Using an over-the-counter vaginal lubricant before you have sexual intercourse.  Using a long-acting vaginal moisturizer.  Using low-dose vaginal estrogen for moderate to severe symptoms that do not respond to other treatments. Options include creams, tablets, and inserts (vaginal rings). Before using vaginal estrogen, tell your health care provider if you have a history of:  Breast cancer.  Endometrial cancer.  Blood clots.  Taking medicines. You may be able to take a daily pill for dyspareunia. Discuss all of the risks of this medicine with your health care provider. It is usually not recommended for women who have a family history or personal history of breast cancer. If your symptoms are very mild and you are not sexually active, you may not need treatment. HOME CARE INSTRUCTIONS  Take medicines only as directed by your health care provider. Do not use herbal or alternative medicines unless your health care provider says that you can.  Use over-the-counter creams, lubricants, or moisturizers for dryness only as directed by your health care provider.  If your atrophic vaginitis is caused by menopause, discuss all of your menopausal symptoms and treatment options with your health care provider.  Do not douche.  Do not use products that can make your vagina dry. These include:  Scented feminine sprays.  Scented tampons.  Scented soaps.  If it hurts to have sex, talk with your sexual partner. SEEK MEDICAL CARE IF:  Your discharge looks different than normal.  Your vagina has an unusual smell.  You have  new symptoms.  Your symptoms do not improve with treatment.  Your symptoms get worse.   This information is not intended to replace advice given to you by your health care provider. Make sure you  discuss any questions you have with your health care provider.   Follow up with your OBGYN for re-evaluation. Use cream daily for help with inflammation and burning sensation. Return to the ED if you experience vaginal bleeding, abdominal pain, fevers, vomiting.

## 2016-02-29 NOTE — ED Notes (Signed)
Attempted to assess pt, pt refuses to discuss why she is here in the ED. Pt states she will only talk to the female PA. Pt refuses to answer any questions or allow for a nursing assessment.

## 2016-02-29 NOTE — ED Notes (Signed)
Pt does not want this nurse to take care of her. Mortimer Fries charge nurse made aware.

## 2016-03-01 DIAGNOSIS — Z8249 Family history of ischemic heart disease and other diseases of the circulatory system: Secondary | ICD-10-CM | POA: Diagnosis not present

## 2016-03-01 DIAGNOSIS — S39012A Strain of muscle, fascia and tendon of lower back, initial encounter: Secondary | ICD-10-CM | POA: Diagnosis not present

## 2016-03-01 DIAGNOSIS — Z886 Allergy status to analgesic agent status: Secondary | ICD-10-CM | POA: Diagnosis not present

## 2016-03-01 DIAGNOSIS — I10 Essential (primary) hypertension: Secondary | ICD-10-CM | POA: Diagnosis not present

## 2016-03-01 DIAGNOSIS — D649 Anemia, unspecified: Secondary | ICD-10-CM | POA: Diagnosis not present

## 2016-03-01 DIAGNOSIS — Z885 Allergy status to narcotic agent status: Secondary | ICD-10-CM | POA: Diagnosis not present

## 2016-03-01 DIAGNOSIS — R531 Weakness: Secondary | ICD-10-CM | POA: Diagnosis not present

## 2016-03-01 DIAGNOSIS — R51 Headache: Secondary | ICD-10-CM | POA: Diagnosis not present

## 2016-03-01 DIAGNOSIS — R079 Chest pain, unspecified: Secondary | ICD-10-CM | POA: Diagnosis not present

## 2016-03-01 DIAGNOSIS — M549 Dorsalgia, unspecified: Secondary | ICD-10-CM | POA: Diagnosis not present

## 2016-03-01 DIAGNOSIS — R000 Tachycardia, unspecified: Secondary | ICD-10-CM | POA: Diagnosis not present

## 2016-03-01 DIAGNOSIS — Z87891 Personal history of nicotine dependence: Secondary | ICD-10-CM | POA: Diagnosis not present

## 2016-03-01 DIAGNOSIS — S300XXA Contusion of lower back and pelvis, initial encounter: Secondary | ICD-10-CM | POA: Diagnosis not present

## 2016-03-01 DIAGNOSIS — Z888 Allergy status to other drugs, medicaments and biological substances status: Secondary | ICD-10-CM | POA: Diagnosis not present

## 2016-03-01 DIAGNOSIS — G35 Multiple sclerosis: Secondary | ICD-10-CM | POA: Diagnosis not present

## 2016-03-01 NOTE — Telephone Encounter (Signed)
Left a second VM for Pt to schedule an appointment.
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
## 2016-03-03 DIAGNOSIS — I10 Essential (primary) hypertension: Secondary | ICD-10-CM | POA: Diagnosis not present

## 2016-03-03 DIAGNOSIS — I471 Supraventricular tachycardia: Secondary | ICD-10-CM | POA: Diagnosis not present

## 2016-03-03 DIAGNOSIS — R002 Palpitations: Secondary | ICD-10-CM | POA: Diagnosis not present

## 2016-03-06 ENCOUNTER — Encounter (HOSPITAL_BASED_OUTPATIENT_CLINIC_OR_DEPARTMENT_OTHER): Payer: Self-pay

## 2016-03-06 ENCOUNTER — Telehealth: Payer: Self-pay | Admitting: *Deleted

## 2016-03-06 ENCOUNTER — Emergency Department (HOSPITAL_BASED_OUTPATIENT_CLINIC_OR_DEPARTMENT_OTHER): Payer: Medicare Other

## 2016-03-06 ENCOUNTER — Emergency Department (HOSPITAL_BASED_OUTPATIENT_CLINIC_OR_DEPARTMENT_OTHER)
Admission: EM | Admit: 2016-03-06 | Discharge: 2016-03-06 | Disposition: A | Discharge status: Home / Self Care | Payer: Medicare Other | Attending: Emergency Medicine | Admitting: Emergency Medicine

## 2016-03-06 DIAGNOSIS — R002 Palpitations: Secondary | ICD-10-CM | POA: Insufficient documentation

## 2016-03-06 DIAGNOSIS — F329 Major depressive disorder, single episode, unspecified: Secondary | ICD-10-CM | POA: Insufficient documentation

## 2016-03-06 DIAGNOSIS — I10 Essential (primary) hypertension: Secondary | ICD-10-CM | POA: Diagnosis not present

## 2016-03-06 DIAGNOSIS — R0601 Orthopnea: Secondary | ICD-10-CM | POA: Insufficient documentation

## 2016-03-06 DIAGNOSIS — Z79899 Other long term (current) drug therapy: Secondary | ICD-10-CM | POA: Diagnosis not present

## 2016-03-06 DIAGNOSIS — E785 Hyperlipidemia, unspecified: Secondary | ICD-10-CM | POA: Insufficient documentation

## 2016-03-06 DIAGNOSIS — Z8542 Personal history of malignant neoplasm of other parts of uterus: Secondary | ICD-10-CM | POA: Insufficient documentation

## 2016-03-06 DIAGNOSIS — Z87891 Personal history of nicotine dependence: Secondary | ICD-10-CM | POA: Insufficient documentation

## 2016-03-06 DIAGNOSIS — M549 Dorsalgia, unspecified: Secondary | ICD-10-CM | POA: Diagnosis not present

## 2016-03-06 DIAGNOSIS — R0602 Shortness of breath: Secondary | ICD-10-CM | POA: Diagnosis not present

## 2016-03-06 DIAGNOSIS — R062 Wheezing: Secondary | ICD-10-CM | POA: Diagnosis not present

## 2016-03-06 LAB — COMPREHENSIVE METABOLIC PANEL
ALT: 38 U/L (ref 14–54)
AST: 21 U/L (ref 15–41)
Albumin: 3.7 g/dL (ref 3.5–5.0)
Alkaline Phosphatase: 79 U/L (ref 38–126)
Anion gap: 6 (ref 5–15)
BUN: 15 mg/dL (ref 6–20)
CO2: 25 mmol/L (ref 22–32)
Calcium: 8.8 mg/dL — ABNORMAL LOW (ref 8.9–10.3)
Chloride: 107 mmol/L (ref 101–111)
Creatinine, Ser: 0.57 mg/dL (ref 0.44–1.00)
GFR calc Af Amer: >60 mL/min (ref >60)
GFR calc non Af Amer: >60 mL/min (ref >60)
Glucose, Bld: 122 mg/dL — ABNORMAL HIGH (ref 65–99)
Potassium: 3.5 mmol/L (ref 3.5–5.1)
Sodium: 138 mmol/L (ref 135–145)
Total Bilirubin: 0.4 mg/dL (ref 0.3–1.2)
Total Protein: 6.8 g/dL (ref 6.5–8.1)

## 2016-03-06 LAB — CBC WITH DIFFERENTIAL/PLATELET
Basophils Absolute: 0 10*3/uL (ref 0.0–0.1)
Basophils Relative: 1 %
Eosinophils Absolute: 0.1 10*3/uL (ref 0.0–0.7)
Eosinophils Relative: 2 %
HCT: 40.8 % (ref 36.0–46.0)
Hemoglobin: 13.5 g/dL (ref 12.0–15.0)
Lymphocytes Relative: 19 %
Lymphs Abs: 1.2 10*3/uL (ref 0.7–4.0)
MCH: 30.3 pg (ref 26.0–34.0)
MCHC: 33.1 g/dL (ref 30.0–36.0)
MCV: 91.5 fL (ref 78.0–100.0)
Monocytes Absolute: 0.6 10*3/uL (ref 0.1–1.0)
Monocytes Relative: 10 %
Neutro Abs: 4.3 10*3/uL (ref 1.7–7.7)
Neutrophils Relative %: 68 %
Platelets: 205 10*3/uL (ref 150–400)
RBC: 4.46 MIL/uL (ref 3.87–5.11)
RDW: 13 % (ref 11.5–15.5)
WBC: 6.3 10*3/uL (ref 4.0–10.5)

## 2016-03-06 LAB — BRAIN NATRIURETIC PEPTIDE: B Natriuretic Peptide: 46 pg/mL (ref 0.0–100.0)

## 2016-03-06 LAB — D-DIMER, QUANTITATIVE (NOT AT ARMC): D-Dimer, Quant: <0.27 ug/mL-FEU (ref 0.00–0.50)

## 2016-03-06 LAB — TROPONIN I: Troponin I: <0.03 ng/mL (ref <0.03)

## 2016-03-06 NOTE — ED Provider Notes (Signed)
Pt seen and evaluated.  D/W Kayla rose Pa-C.  Pt with CC of palpitations, and currently wearing event monitor from Dr. Lance Bosch office [facility]).  Has ROS + for DOE, sob,palpitaions, and almost universally + including weight gain, deconditioning, and "its hot and I don't have AC in my car".  EKG with PR.111. No ectopy. Lasb reassuring with normal HB, and _ trop, d-dimer, and BNP.  Normal exam. + Obesity.  Appropriate for Dc and f/U with cardiologist.   Tanna Furry, MD 03/06/16 1600

## 2016-03-06 NOTE — Telephone Encounter (Signed)
PA initiated over the phone PA refer # [id number]

## 2016-03-06 NOTE — ED Notes (Signed)
PA at the bedside.
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
## 2016-03-06 NOTE — ED Triage Notes (Addendum)
C/o SOB, back pain x 3 days-NAD-steady gait

## 2016-03-06 NOTE — Discharge Instructions (Signed)
Your workup today did not show any emergent cause for your shortness of breath. Please follow-up with your primary care doctor and her cardiologist.

## 2016-03-08 ENCOUNTER — Ambulatory Visit (INDEPENDENT_AMBULATORY_CARE_PROVIDER_SITE_OTHER): Payer: Medicare Other | Admitting: Family Medicine

## 2016-03-08 ENCOUNTER — Telehealth: Payer: Self-pay

## 2016-03-08 ENCOUNTER — Encounter: Payer: Self-pay | Admitting: Family Medicine

## 2016-03-08 VITALS — BP 120/69 | HR 96 | Temp 99.2°F | Resp 20 | Ht 62.0 in | Wt 207.0 lb

## 2016-03-08 DIAGNOSIS — R000 Tachycardia, unspecified: Secondary | ICD-10-CM | POA: Diagnosis not present

## 2016-03-08 DIAGNOSIS — I10 Essential (primary) hypertension: Secondary | ICD-10-CM | POA: Diagnosis not present

## 2016-03-08 DIAGNOSIS — J320 Chronic maxillary sinusitis: Secondary | ICD-10-CM

## 2016-03-08 DIAGNOSIS — K5901 Slow transit constipation: Secondary | ICD-10-CM

## 2016-03-08 DIAGNOSIS — H04123 Dry eye syndrome of bilateral lacrimal glands: Secondary | ICD-10-CM

## 2016-03-08 MED ORDER — CLINDAMYCIN HCL 75 MG PO CAPS: 75.0000 mg | ORAL_CAPSULE | Freq: Three times a day (TID) | ORAL | 0 refills | Status: DC

## 2016-03-08 NOTE — Patient Instructions (Signed)
Benefiber mixed with cup of water or drink daily.  Then take you Ex-Lax twice a week.

## 2016-03-08 NOTE — Telephone Encounter (Signed)
Do you want this to read 150mg  1 Q day?

## 2016-03-08 NOTE — Progress Notes (Signed)
Subjective:    CC:   HPI:  Having some swelling and nasal pressure on the left side of her face.  Yesterday she felt a little off balance. She says her symptoms have been present for about 2 weeks. No fevers chills. She has been having a lot of sweating. She typically doesn't nasal rinse every morning and says yesterday was not able to move fluid through because it was so swollen. She's been getting a thick white discharge.    He is currently wearing a Holter monitor. She's having a paradox of her blood pressure and her pulse. She says sometimes her pressures will shoot up even though she's been taking her medication regularly. She takes a half a tab labetalol twice a day. And then other times her blood pressure will be most low but then she tends to become tachycardic. She had an episode at the gym yesterday where she felt a little off. She actually started leaning to the left side when she was going around the desk and just felt a little lightheaded. She's noticed that she's been sweating excessively.  She also been coming in complaining of abdominal pain and bloating. She did have a CT of the abdomen and pelvis with contrast done at [facility]. She did have multiple loops of the bowel suggestive of chronic stasis. Some mild aortoiliac atherosclerotic disease. Small fat-containing umbilical hernia and otherwise negative scan. She doesn't tolerate MiraLAX and just uses Ex-Lax occasionally. In fact she took a few days ago and had several bowel movements that day.  Past medical history, Surgical history, Family history not pertinant except as noted below, Social history, Allergies, and medications have been entered into the medical record, reviewed, and corrections made.   Review of Systems: No fevers, chills, night sweats, weight loss, chest pain, or shortness of breath.   Objective:    General: Well Developed, well nourished, and in no acute distress.  Neuro: Alert and oriented x3,  extra-ocular muscles intact, sensation grossly intact.  HEENT: Normocephalic, atraumatic, Op clear, EOMi, no cerv LNs.  TMs and canals are clear bilaterally.    Skin: Warm and dry, no rashes. Cardiac: Regular rate and rhythm, no murmurs rubs or gallops, no lower extremity edema.  Respiratory: Clear to auscultation bilaterally. Not using accessory muscles, speaking in full sentences.     Impression and Recommendations:   Left-sided maxillary sinusitis-we'll treat with clindamycin based on her current allergies. She doesn't tolerate high doses so I did stick with the 75 mg dose.  Bowel stasis-discussed getting on a regimen. Recommend start Benefiber daily and actually keep a log of the frequency of bowel movements. She can use Ex-Lax 2 or 3 times a week but encouraged her to do more scheduled instead of waiting until she hasn't had a bowel movement for a couple of days.  Tachycardia-she's currently wearing a Holter monitor for 14 days. She's hoping that they will be able to extend to 30 days. She had an episode where her heart was "racing quite fast. She is noticing now that it's very easy to get up 220 beats when she does go to the gym but it doesn't seem to come back down afterwards.  Dry eye-she did want to know if I would be willing to prescribe her Restasis. She really needs to get that from her eye doctor. In the interim she can use eye moisturizing drops.GenTeal is a good brand.

## 2016-03-08 NOTE — Telephone Encounter (Signed)
Ok to change to 150mg?

## 2016-03-09 ENCOUNTER — Telehealth: Payer: Self-pay | Admitting: Gynecologic Oncology

## 2016-03-09 DIAGNOSIS — D320 Benign neoplasm of cerebral meninges: Secondary | ICD-10-CM | POA: Diagnosis not present

## 2016-03-09 DIAGNOSIS — I10 Essential (primary) hypertension: Secondary | ICD-10-CM | POA: Diagnosis not present

## 2016-03-09 DIAGNOSIS — I471 Supraventricular tachycardia: Secondary | ICD-10-CM | POA: Diagnosis not present

## 2016-03-09 DIAGNOSIS — E269 Hyperaldosteronism, unspecified: Secondary | ICD-10-CM | POA: Diagnosis not present

## 2016-03-09 NOTE — Telephone Encounter (Signed)
150mg TID

## 2016-03-09 NOTE — Telephone Encounter (Signed)
Pharmacy called, verified change. No further questions.

## 2016-03-09 NOTE — Telephone Encounter (Signed)
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  "I just feel unstable.  Miserable, can't sleep, excessive weight gain, sweaty at night, emotional."  Hormone creams/patches were discussed at her visit for atrophic vaginitis but she thinks her insurance will not cover it.  Advised her concerns would be discussed with a GYN Oncologist and we would give her a call back.  Advised to call for any needs in between that time.

## 2016-03-13 NOTE — Telephone Encounter (Signed)
PA was denied for estradiol. Letter placed in providers box

## 2016-03-14 DIAGNOSIS — Z888 Allergy status to other drugs, medicaments and biological substances status: Secondary | ICD-10-CM | POA: Diagnosis not present

## 2016-03-14 DIAGNOSIS — J45909 Unspecified asthma, uncomplicated: Secondary | ICD-10-CM | POA: Diagnosis not present

## 2016-03-14 DIAGNOSIS — R531 Weakness: Secondary | ICD-10-CM | POA: Diagnosis not present

## 2016-03-14 DIAGNOSIS — Z9889 Other specified postprocedural states: Secondary | ICD-10-CM | POA: Diagnosis not present

## 2016-03-14 DIAGNOSIS — D509 Iron deficiency anemia, unspecified: Secondary | ICD-10-CM | POA: Diagnosis not present

## 2016-03-14 DIAGNOSIS — K5730 Diverticulosis of large intestine without perforation or abscess without bleeding: Secondary | ICD-10-CM | POA: Diagnosis not present

## 2016-03-14 DIAGNOSIS — I10 Essential (primary) hypertension: Secondary | ICD-10-CM | POA: Diagnosis not present

## 2016-03-14 DIAGNOSIS — M549 Dorsalgia, unspecified: Secondary | ICD-10-CM | POA: Diagnosis not present

## 2016-03-14 DIAGNOSIS — G35 Multiple sclerosis: Secondary | ICD-10-CM | POA: Diagnosis not present

## 2016-03-14 DIAGNOSIS — R7303 Prediabetes: Secondary | ICD-10-CM | POA: Diagnosis not present

## 2016-03-14 DIAGNOSIS — G8929 Other chronic pain: Secondary | ICD-10-CM | POA: Diagnosis not present

## 2016-03-14 DIAGNOSIS — E785 Hyperlipidemia, unspecified: Secondary | ICD-10-CM | POA: Diagnosis not present

## 2016-03-14 DIAGNOSIS — R1030 Lower abdominal pain, unspecified: Secondary | ICD-10-CM | POA: Diagnosis not present

## 2016-03-14 DIAGNOSIS — Z9049 Acquired absence of other specified parts of digestive tract: Secondary | ICD-10-CM | POA: Diagnosis not present

## 2016-03-14 DIAGNOSIS — R569 Unspecified convulsions: Secondary | ICD-10-CM | POA: Diagnosis not present

## 2016-03-14 DIAGNOSIS — I471 Supraventricular tachycardia: Secondary | ICD-10-CM | POA: Diagnosis not present

## 2016-03-14 DIAGNOSIS — K2270 Barrett's esophagus without dysplasia: Secondary | ICD-10-CM | POA: Diagnosis not present

## 2016-03-14 DIAGNOSIS — K5790 Diverticulosis of intestine, part unspecified, without perforation or abscess without bleeding: Secondary | ICD-10-CM | POA: Diagnosis not present

## 2016-03-14 DIAGNOSIS — E2609 Other primary hyperaldosteronism: Secondary | ICD-10-CM | POA: Diagnosis not present

## 2016-03-14 DIAGNOSIS — Z9861 Coronary angioplasty status: Secondary | ICD-10-CM | POA: Diagnosis not present

## 2016-03-14 DIAGNOSIS — Z87891 Personal history of nicotine dependence: Secondary | ICD-10-CM | POA: Diagnosis not present

## 2016-03-15 ENCOUNTER — Telehealth: Payer: Self-pay | Admitting: Gynecologic Oncology

## 2016-03-15 DIAGNOSIS — N951 Menopausal and female climacteric states: Secondary | ICD-10-CM

## 2016-03-15 MED ORDER — ESTRADIOL 0.5 MG PO TABS: 0.5000 mg | ORAL_TABLET | Freq: Every day | ORAL | 3 refills | Status: DC

## 2016-03-15 NOTE — Telephone Encounter (Signed)
Denied letter placed in providers box

## 2016-03-15 NOTE — Telephone Encounter (Signed)
Left message for patient asking her to please call the office to discuss Dr. Elenora Gamma recommendations for initiating estradiol 0.5 mg once daily for her menopausal symptoms.  Advised her to please call the office to discuss.

## 2016-03-15 NOTE — Telephone Encounter (Signed)
Spoke with patient about Dr. Elenora Gamma recommendations for estradiol 0.5 mg daily for her menopausal symptoms.  Patient stating she is currently wearing a heart monitor until the end of the month.  Patient open to trying the medication.  No interaction alerts flagged when placing the orders with her other medications.  Patient advised to please call and update our office on how she is feeling after beginning the medication.  All questions answered.

## 2016-03-17 DIAGNOSIS — G35 Multiple sclerosis: Secondary | ICD-10-CM | POA: Diagnosis not present

## 2016-03-17 DIAGNOSIS — R61 Generalized hyperhidrosis: Secondary | ICD-10-CM | POA: Diagnosis not present

## 2016-03-17 DIAGNOSIS — K5900 Constipation, unspecified: Secondary | ICD-10-CM | POA: Diagnosis not present

## 2016-03-17 DIAGNOSIS — R079 Chest pain, unspecified: Secondary | ICD-10-CM | POA: Diagnosis not present

## 2016-03-17 DIAGNOSIS — Z87891 Personal history of nicotine dependence: Secondary | ICD-10-CM | POA: Diagnosis not present

## 2016-03-17 DIAGNOSIS — Z886 Allergy status to analgesic agent status: Secondary | ICD-10-CM | POA: Diagnosis not present

## 2016-03-17 DIAGNOSIS — D649 Anemia, unspecified: Secondary | ICD-10-CM | POA: Diagnosis not present

## 2016-03-17 DIAGNOSIS — Z885 Allergy status to narcotic agent status: Secondary | ICD-10-CM | POA: Diagnosis not present

## 2016-03-17 DIAGNOSIS — K449 Diaphragmatic hernia without obstruction or gangrene: Secondary | ICD-10-CM | POA: Diagnosis not present

## 2016-03-17 DIAGNOSIS — R0602 Shortness of breath: Secondary | ICD-10-CM | POA: Diagnosis not present

## 2016-03-17 DIAGNOSIS — Z888 Allergy status to other drugs, medicaments and biological substances status: Secondary | ICD-10-CM | POA: Diagnosis not present

## 2016-03-17 DIAGNOSIS — I10 Essential (primary) hypertension: Secondary | ICD-10-CM | POA: Diagnosis not present

## 2016-03-17 DIAGNOSIS — Z8249 Family history of ischemic heart disease and other diseases of the circulatory system: Secondary | ICD-10-CM | POA: Diagnosis not present

## 2016-03-20 ENCOUNTER — Telehealth: Payer: Self-pay

## 2016-03-20 DIAGNOSIS — Z886 Allergy status to analgesic agent status: Secondary | ICD-10-CM | POA: Diagnosis not present

## 2016-03-20 DIAGNOSIS — R110 Nausea: Secondary | ICD-10-CM | POA: Diagnosis not present

## 2016-03-20 DIAGNOSIS — R079 Chest pain, unspecified: Secondary | ICD-10-CM | POA: Diagnosis not present

## 2016-03-20 DIAGNOSIS — Z888 Allergy status to other drugs, medicaments and biological substances status: Secondary | ICD-10-CM | POA: Diagnosis not present

## 2016-03-20 DIAGNOSIS — K589 Irritable bowel syndrome without diarrhea: Secondary | ICD-10-CM | POA: Diagnosis not present

## 2016-03-20 DIAGNOSIS — D649 Anemia, unspecified: Secondary | ICD-10-CM | POA: Diagnosis not present

## 2016-03-20 DIAGNOSIS — K5900 Constipation, unspecified: Secondary | ICD-10-CM | POA: Diagnosis not present

## 2016-03-20 DIAGNOSIS — R109 Unspecified abdominal pain: Secondary | ICD-10-CM | POA: Diagnosis not present

## 2016-03-20 DIAGNOSIS — K581 Irritable bowel syndrome with constipation: Secondary | ICD-10-CM | POA: Diagnosis not present

## 2016-03-20 DIAGNOSIS — Z885 Allergy status to narcotic agent status: Secondary | ICD-10-CM | POA: Diagnosis not present

## 2016-03-20 DIAGNOSIS — G35 Multiple sclerosis: Secondary | ICD-10-CM | POA: Diagnosis not present

## 2016-03-20 DIAGNOSIS — R195 Other fecal abnormalities: Secondary | ICD-10-CM

## 2016-03-20 DIAGNOSIS — I10 Essential (primary) hypertension: Secondary | ICD-10-CM | POA: Diagnosis not present

## 2016-03-20 DIAGNOSIS — Z8249 Family history of ischemic heart disease and other diseases of the circulatory system: Secondary | ICD-10-CM | POA: Diagnosis not present

## 2016-03-20 DIAGNOSIS — Z87891 Personal history of nicotine dependence: Secondary | ICD-10-CM | POA: Diagnosis not present

## 2016-03-20 DIAGNOSIS — K5790 Diverticulosis of intestine, part unspecified, without perforation or abscess without bleeding: Secondary | ICD-10-CM | POA: Diagnosis not present

## 2016-03-20 NOTE — Telephone Encounter (Signed)
Placed order for stool culture and c diff. Faxed order to [facility] lab. [phone]. Patient is aware.

## 2016-03-20 NOTE — Telephone Encounter (Signed)
We can order stool culture and c diff as well.  Ok to place order and she can go to the lab.

## 2016-03-20 NOTE — Telephone Encounter (Signed)
Nelta called and states she Ex-lax and the Fleet is not helping with BM. She states her stool is thin and is mostly mucus. She has abdominal bloating and her BM has a very foul smell. She would like to get her stool tested. She wanted to come in for appointment. There are no openings. She reports this has been going on for more than 2 months. Please advise.

## 2016-03-22 ENCOUNTER — Telehealth: Payer: Self-pay | Admitting: *Deleted

## 2016-03-22 ENCOUNTER — Emergency Department (HOSPITAL_BASED_OUTPATIENT_CLINIC_OR_DEPARTMENT_OTHER)
Admission: EM | Admit: 2016-03-22 | Discharge: 2016-03-23 | Disposition: A | Discharge status: Home / Self Care | Payer: Medicare Other | Attending: Emergency Medicine | Admitting: Emergency Medicine

## 2016-03-22 ENCOUNTER — Encounter (HOSPITAL_BASED_OUTPATIENT_CLINIC_OR_DEPARTMENT_OTHER): Payer: Self-pay

## 2016-03-22 ENCOUNTER — Emergency Department (HOSPITAL_BASED_OUTPATIENT_CLINIC_OR_DEPARTMENT_OTHER): Payer: Medicare Other

## 2016-03-22 DIAGNOSIS — R61 Generalized hyperhidrosis: Secondary | ICD-10-CM | POA: Diagnosis not present

## 2016-03-22 DIAGNOSIS — R195 Other fecal abnormalities: Secondary | ICD-10-CM | POA: Diagnosis not present

## 2016-03-22 DIAGNOSIS — Z87891 Personal history of nicotine dependence: Secondary | ICD-10-CM | POA: Diagnosis not present

## 2016-03-22 DIAGNOSIS — Z79899 Other long term (current) drug therapy: Secondary | ICD-10-CM | POA: Diagnosis not present

## 2016-03-22 DIAGNOSIS — J45909 Unspecified asthma, uncomplicated: Secondary | ICD-10-CM | POA: Diagnosis not present

## 2016-03-22 DIAGNOSIS — K5900 Constipation, unspecified: Secondary | ICD-10-CM

## 2016-03-22 DIAGNOSIS — Z8541 Personal history of malignant neoplasm of cervix uteri: Secondary | ICD-10-CM | POA: Diagnosis not present

## 2016-03-22 DIAGNOSIS — I10 Essential (primary) hypertension: Secondary | ICD-10-CM | POA: Insufficient documentation

## 2016-03-22 LAB — BASIC METABOLIC PANEL
Anion gap: 9 (ref 5–15)
BUN: 16 mg/dL (ref 6–20)
CO2: 23 mmol/L (ref 22–32)
Calcium: 8.6 mg/dL — ABNORMAL LOW (ref 8.9–10.3)
Chloride: 109 mmol/L (ref 101–111)
Creatinine, Ser: 0.71 mg/dL (ref 0.44–1.00)
GFR calc Af Amer: >60 mL/min (ref >60)
GFR calc non Af Amer: >60 mL/min (ref >60)
Glucose, Bld: 138 mg/dL — ABNORMAL HIGH (ref 65–99)
Potassium: 3.2 mmol/L — ABNORMAL LOW (ref 3.5–5.1)
Sodium: 141 mmol/L (ref 135–145)

## 2016-03-22 LAB — CBC WITH DIFFERENTIAL/PLATELET
Basophils Absolute: 0 10*3/uL (ref 0.0–0.1)
Basophils Relative: 1 %
Eosinophils Absolute: 0.1 10*3/uL (ref 0.0–0.7)
Eosinophils Relative: 2 %
HCT: 39.8 % (ref 36.0–46.0)
Hemoglobin: 13.1 g/dL (ref 12.0–15.0)
Lymphocytes Relative: 24 %
Lymphs Abs: 1.6 10*3/uL (ref 0.7–4.0)
MCH: 30.3 pg (ref 26.0–34.0)
MCHC: 32.9 g/dL (ref 30.0–36.0)
MCV: 92.1 fL (ref 78.0–100.0)
Monocytes Absolute: 0.6 10*3/uL (ref 0.1–1.0)
Monocytes Relative: 8 %
Neutro Abs: 4.3 10*3/uL (ref 1.7–7.7)
Neutrophils Relative %: 65 %
Platelets: 201 10*3/uL (ref 150–400)
RBC: 4.32 MIL/uL (ref 3.87–5.11)
RDW: 13 % (ref 11.5–15.5)
WBC: 6.6 10*3/uL (ref 4.0–10.5)

## 2016-03-22 MED ORDER — IOPAMIDOL (ISOVUE-300) INJECTION 61%
100.0000 mL | Freq: Once | INTRAVENOUS | Status: AC | PRN
  Administered 2016-03-22: 100 mL via INTRAVENOUS

## 2016-03-22 MED ORDER — CLOBETASOL PROPIONATE 0.05 % EX CREA: 1.0000 "application " | TOPICAL_CREAM | Freq: Every day | CUTANEOUS | 99 refills | Status: DC

## 2016-03-22 MED ORDER — FLEET ENEMA 7-19 GM/118ML RE ENEM: 1.0000 | ENEMA | Freq: Every day | RECTAL | 0 refills | Status: DC | PRN

## 2016-03-22 NOTE — ED Notes (Signed)
Patient transported to CT 
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
## 2016-03-22 NOTE — ED Triage Notes (Addendum)
C/o constipation x 1 month-also c/o change in form and color of stools x 2-3 months-pt NAD-steady gait

## 2016-03-22 NOTE — Telephone Encounter (Signed)
Pt informed that c-diff was neg. She also asked for a refill of the  Clobetasol sent to [facility]. rx sent.Jennifer [name] [place]

## 2016-03-24 ENCOUNTER — Telehealth: Payer: Self-pay | Admitting: Family Medicine

## 2016-03-24 ENCOUNTER — Ambulatory Visit: Payer: Self-pay | Admitting: Family Medicine

## 2016-03-24 NOTE — Telephone Encounter (Signed)
Call patient: We are Jennifer [name] informed her that her C. difficile was negative but we did receive her stool culture results today as well and those are negative. Says she doesn't have any Escherichia coli or Salmonella or Shigella or Campylobacter or Giardia

## 2016-03-24 NOTE — Telephone Encounter (Signed)
SPOKE TO PATIENT GAVE HER RESULTS AS NOTED BELOW. Aina Rossbach,CMA  

## 2016-03-28 ENCOUNTER — Ambulatory Visit (INDEPENDENT_AMBULATORY_CARE_PROVIDER_SITE_OTHER): Payer: Medicare Other | Admitting: Family Medicine

## 2016-03-28 ENCOUNTER — Encounter: Payer: Self-pay | Admitting: Family Medicine

## 2016-03-28 ENCOUNTER — Ambulatory Visit (INDEPENDENT_AMBULATORY_CARE_PROVIDER_SITE_OTHER): Payer: Medicare Other

## 2016-03-28 VITALS — BP 140/81 | HR 98 | Resp 19 | Ht 62.0 in | Wt 208.0 lb

## 2016-03-28 DIAGNOSIS — G35 Multiple sclerosis: Secondary | ICD-10-CM | POA: Diagnosis not present

## 2016-03-28 DIAGNOSIS — J019 Acute sinusitis, unspecified: Secondary | ICD-10-CM | POA: Diagnosis not present

## 2016-03-28 DIAGNOSIS — R1011 Right upper quadrant pain: Secondary | ICD-10-CM | POA: Diagnosis not present

## 2016-03-28 DIAGNOSIS — R0602 Shortness of breath: Secondary | ICD-10-CM | POA: Diagnosis not present

## 2016-03-28 DIAGNOSIS — M79645 Pain in left finger(s): Secondary | ICD-10-CM

## 2016-03-28 DIAGNOSIS — Z87891 Personal history of nicotine dependence: Secondary | ICD-10-CM | POA: Diagnosis not present

## 2016-03-28 DIAGNOSIS — I10 Essential (primary) hypertension: Secondary | ICD-10-CM | POA: Diagnosis not present

## 2016-03-28 DIAGNOSIS — G8929 Other chronic pain: Secondary | ICD-10-CM | POA: Diagnosis not present

## 2016-03-28 DIAGNOSIS — M7989 Other specified soft tissue disorders: Secondary | ICD-10-CM | POA: Diagnosis not present

## 2016-03-28 NOTE — Addendum Note (Signed)
Addended by: Teddy Spike on: 03/28/2016 09:21 AM   Modules accepted: Orders
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
## 2016-03-28 NOTE — Patient Instructions (Signed)
Clindamycin 150mg  is twice a day.

## 2016-03-29 ENCOUNTER — Emergency Department (HOSPITAL_BASED_OUTPATIENT_CLINIC_OR_DEPARTMENT_OTHER)
Admission: EM | Admit: 2016-03-29 | Discharge: 2016-03-29 | Disposition: A | Discharge status: Home / Self Care | Payer: Medicare Other | Attending: Emergency Medicine | Admitting: Emergency Medicine

## 2016-03-29 ENCOUNTER — Encounter (HOSPITAL_BASED_OUTPATIENT_CLINIC_OR_DEPARTMENT_OTHER): Payer: Self-pay

## 2016-03-29 DIAGNOSIS — R05 Cough: Secondary | ICD-10-CM

## 2016-03-29 DIAGNOSIS — Z87891 Personal history of nicotine dependence: Secondary | ICD-10-CM | POA: Diagnosis not present

## 2016-03-29 DIAGNOSIS — J45909 Unspecified asthma, uncomplicated: Secondary | ICD-10-CM | POA: Diagnosis not present

## 2016-03-29 DIAGNOSIS — R059 Cough, unspecified: Secondary | ICD-10-CM

## 2016-03-29 DIAGNOSIS — R0981 Nasal congestion: Secondary | ICD-10-CM | POA: Diagnosis not present

## 2016-03-29 DIAGNOSIS — I10 Essential (primary) hypertension: Secondary | ICD-10-CM | POA: Diagnosis not present

## 2016-03-29 DIAGNOSIS — Z79899 Other long term (current) drug therapy: Secondary | ICD-10-CM | POA: Insufficient documentation

## 2016-03-29 DIAGNOSIS — Z8542 Personal history of malignant neoplasm of other parts of uterus: Secondary | ICD-10-CM | POA: Insufficient documentation

## 2016-03-29 MED ORDER — DIPHENHYDRAMINE HCL 25 MG PO TABS: 25.0000 mg | ORAL_TABLET | Freq: Four times a day (QID) | ORAL | 0 refills | Status: DC | PRN

## 2016-03-29 NOTE — Telephone Encounter (Signed)
Thanks for letting me know!
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
## 2016-03-29 NOTE — ED Notes (Signed)
MD at bedside. 

## 2016-03-29 NOTE — ED Triage Notes (Signed)
Pt c/o SOB, itching all over after starting abx from PCP yesterday-pt NAD-RT assessed-lungs clear-NAD-steady gait

## 2016-03-30 ENCOUNTER — Encounter: Payer: Self-pay | Admitting: Family Medicine

## 2016-03-30 ENCOUNTER — Telehealth: Payer: Self-pay

## 2016-03-30 NOTE — Telephone Encounter (Signed)
Verneal called and states she had to stop the clindamycin because of shortness of breath. She states she still has the issues infection. She would like to have another antibiotic. She would try to take doxycycline or low dose Levaquin. Please advise.

## 2016-03-31 MED ORDER — LEVOFLOXACIN 250 MG PO TABS: 250.0000 mg | ORAL_TABLET | Freq: Every day | ORAL | 0 refills | Status: DC

## 2016-03-31 NOTE — Telephone Encounter (Signed)
Sent rx for low dose levaquin.

## 2016-03-31 NOTE — Telephone Encounter (Signed)
Patient advised.

## 2016-04-02 DIAGNOSIS — R002 Palpitations: Secondary | ICD-10-CM | POA: Diagnosis not present

## 2016-04-04 DIAGNOSIS — D649 Anemia, unspecified: Secondary | ICD-10-CM | POA: Diagnosis not present

## 2016-04-04 DIAGNOSIS — R000 Tachycardia, unspecified: Secondary | ICD-10-CM | POA: Diagnosis not present

## 2016-04-04 DIAGNOSIS — I10 Essential (primary) hypertension: Secondary | ICD-10-CM | POA: Diagnosis not present

## 2016-04-04 DIAGNOSIS — Z79899 Other long term (current) drug therapy: Secondary | ICD-10-CM | POA: Diagnosis not present

## 2016-04-05 ENCOUNTER — Telehealth: Payer: Self-pay | Admitting: *Deleted

## 2016-04-05 MED ORDER — DOXYCYCLINE HYCLATE 100 MG PO TABS: 100.0000 mg | ORAL_TABLET | Freq: Two times a day (BID) | ORAL | 0 refills | Status: DC

## 2016-04-05 NOTE — Telephone Encounter (Signed)
Pt informed of recommendations and voiced understanding.Jennifer [name], Jennifer [name]

## 2016-04-05 NOTE — Telephone Encounter (Signed)
Spoke w/pt and she stated that she would like to try a low dose of doxy. She reports that her sinuses are not getting any better. She also has been experiencing a bloody nose and has been using the mupirocin ointment for this.   She also wanted Dr. Madilyn Fireman to know that her bp and heart rate have been going crazy and she feels worse after taking the bp meds and wanted to know if she thinks that this should be switched. I told her that she should continue on the med and just f/u with pcp about this at next OV.   Will fwd to pcp for advice.Jennifer [name]

## 2016-04-05 NOTE — Telephone Encounter (Signed)
OK, new rx sent for doxycycline. If she has GI upset with SHE can always split in half and take a half a tab twice a day. Also I agree as far as the blood pressure and heart rate goes just encourage her to take her medication as consistently as possible. And write down her blood pressures and pulses. Do not check more than twice a day. Bring in a log of those numbers with her at her next appointment.

## 2016-04-06 DIAGNOSIS — Z885 Allergy status to narcotic agent status: Secondary | ICD-10-CM | POA: Diagnosis not present

## 2016-04-06 DIAGNOSIS — R55 Syncope and collapse: Secondary | ICD-10-CM | POA: Diagnosis not present

## 2016-04-06 DIAGNOSIS — M791 Myalgia: Secondary | ICD-10-CM | POA: Diagnosis not present

## 2016-04-06 DIAGNOSIS — Z8249 Family history of ischemic heart disease and other diseases of the circulatory system: Secondary | ICD-10-CM | POA: Diagnosis not present

## 2016-04-06 DIAGNOSIS — R61 Generalized hyperhidrosis: Secondary | ICD-10-CM | POA: Diagnosis not present

## 2016-04-06 DIAGNOSIS — R002 Palpitations: Secondary | ICD-10-CM | POA: Diagnosis not present

## 2016-04-06 DIAGNOSIS — Z886 Allergy status to analgesic agent status: Secondary | ICD-10-CM | POA: Diagnosis not present

## 2016-04-06 DIAGNOSIS — G35 Multiple sclerosis: Secondary | ICD-10-CM | POA: Diagnosis not present

## 2016-04-06 DIAGNOSIS — D649 Anemia, unspecified: Secondary | ICD-10-CM | POA: Diagnosis not present

## 2016-04-06 DIAGNOSIS — R5383 Other fatigue: Secondary | ICD-10-CM | POA: Diagnosis not present

## 2016-04-06 DIAGNOSIS — Z888 Allergy status to other drugs, medicaments and biological substances status: Secondary | ICD-10-CM | POA: Diagnosis not present

## 2016-04-06 DIAGNOSIS — I10 Essential (primary) hypertension: Secondary | ICD-10-CM | POA: Diagnosis not present

## 2016-04-06 DIAGNOSIS — Z87891 Personal history of nicotine dependence: Secondary | ICD-10-CM | POA: Diagnosis not present

## 2016-04-11 DIAGNOSIS — R002 Palpitations: Secondary | ICD-10-CM | POA: Diagnosis not present

## 2016-04-11 DIAGNOSIS — Z87891 Personal history of nicotine dependence: Secondary | ICD-10-CM | POA: Diagnosis not present

## 2016-04-11 DIAGNOSIS — R000 Tachycardia, unspecified: Secondary | ICD-10-CM | POA: Diagnosis not present

## 2016-04-11 DIAGNOSIS — Z885 Allergy status to narcotic agent status: Secondary | ICD-10-CM | POA: Diagnosis not present

## 2016-04-11 DIAGNOSIS — R531 Weakness: Secondary | ICD-10-CM | POA: Diagnosis not present

## 2016-04-11 DIAGNOSIS — D649 Anemia, unspecified: Secondary | ICD-10-CM | POA: Diagnosis not present

## 2016-04-11 DIAGNOSIS — Z8249 Family history of ischemic heart disease and other diseases of the circulatory system: Secondary | ICD-10-CM | POA: Diagnosis not present

## 2016-04-11 DIAGNOSIS — Z886 Allergy status to analgesic agent status: Secondary | ICD-10-CM | POA: Diagnosis not present

## 2016-04-11 DIAGNOSIS — Z888 Allergy status to other drugs, medicaments and biological substances status: Secondary | ICD-10-CM | POA: Diagnosis not present

## 2016-04-11 DIAGNOSIS — I10 Essential (primary) hypertension: Secondary | ICD-10-CM | POA: Diagnosis not present

## 2016-04-11 DIAGNOSIS — G35 Multiple sclerosis: Secondary | ICD-10-CM | POA: Diagnosis not present

## 2016-04-11 DIAGNOSIS — J029 Acute pharyngitis, unspecified: Secondary | ICD-10-CM | POA: Diagnosis not present

## 2016-04-11 DIAGNOSIS — R079 Chest pain, unspecified: Secondary | ICD-10-CM | POA: Diagnosis not present

## 2016-04-11 DIAGNOSIS — R0602 Shortness of breath: Secondary | ICD-10-CM | POA: Diagnosis not present

## 2016-04-12 ENCOUNTER — Ambulatory Visit (INDEPENDENT_AMBULATORY_CARE_PROVIDER_SITE_OTHER): Payer: Medicare Other | Admitting: Family Medicine

## 2016-04-12 ENCOUNTER — Encounter: Payer: Self-pay | Admitting: Family Medicine

## 2016-04-12 VITALS — BP 135/78 | HR 103 | Ht 62.0 in | Wt 208.0 lb

## 2016-04-12 DIAGNOSIS — I10 Essential (primary) hypertension: Secondary | ICD-10-CM

## 2016-04-12 DIAGNOSIS — B353 Tinea pedis: Secondary | ICD-10-CM | POA: Diagnosis not present

## 2016-04-12 DIAGNOSIS — J383 Other diseases of vocal cords: Secondary | ICD-10-CM | POA: Diagnosis not present

## 2016-04-12 DIAGNOSIS — R196 Halitosis: Secondary | ICD-10-CM

## 2016-04-12 DIAGNOSIS — J019 Acute sinusitis, unspecified: Secondary | ICD-10-CM | POA: Diagnosis not present

## 2016-04-12 MED ORDER — TERBINAFINE HCL 1 % EX CREA: 1.0000 "application " | TOPICAL_CREAM | Freq: Two times a day (BID) | CUTANEOUS | 0 refills | Status: DC

## 2016-04-12 NOTE — Progress Notes (Signed)
Subjective:    CC: HTN   HPI:  She comes in today complaining of a rash on her left foot particularly between the fourth and fifth toes. She's been using clobetasol cream on it. It has been irritated and itchy. She says she regularly 6 her feet and vinegar and Listerine and has been doing this as well.  HTN - She also caught a reported that she felt like her blood pressures and pulse are going up and down. She thought maybe she was feeling worse after taking the blood pressure medications. She has been on the labetalol for approx 3 months, so this would be very unusual to suddenly start having problems with the medication.She was told that she may have had some episodes of atrial flutter while wearing her cardiac monitor.  Sinusitis-she called and said she felt like her sinuses were really not getting any better so we decided to call and let us doxycycline. Told her that she could split the tabs in half if she started to have some GI upset with it. She said she took the doxazosin for couple of days and actually did not upset her stomach but then stopped it. She also wanted emergency department for her symptoms and was told to take an antihistamine and not take the antibiotic. But she feels like she still not better so plans on restarting it.  She s scheduled to see cardiology in the next couple of weeks. She turned in her cardiac monitor and was told that she may have had some episodes of atrial flutter says she is awaiting to see what they may say. She is still taking her labetalol currently.  She's also concerned that she is getting bad breath again. She has a history of recurrent tonsillar stones. Sometimes she is able to remove them herself and other times not. She says she swishes with blistering regularly.   Past medical history, Surgical history, Family history not pertinant except as noted below, Social history, Allergies, and medications have been entered into the medical record, reviewed,  and corrections made.   Review of Systems: No fevers, chills, night sweats, weight loss, chest pain, or shortness of breath.   Objective:    General: Well Developed, well nourished, and in no acute distress.  Neuro: Alert and oriented x3, extra-ocular muscles intact, sensation grossly intact.  HEENT: Normocephalic, atraumatic , [place] clear, TMs and canals are clear bilaterally. No significant cervical lymphadenopathy.  Skin: Warm and dry, no rashes.Between her fourth and fifth toes on her right foot she has macerated skin with cracking. Cardiac: Regular rate and rhythm, no murmurs rubs or gallops, no lower extremity edema.  Respiratory: Clear to auscultation bilaterally. Not using accessory muscles, speaking in full sentences.   Impression and Recommendations:   HTN - Continue with labetalol for now and keep appointment next week for further evaluation and to go over recent cardiac results.  Sinusitis - Plans on restarting her antibiotic. I do feel like some of her reaction is probably just from histamine release. Continue with nasal saline rinse etc.  Tinea pedis - Treat with topical Lamisil x 2 weeks.   Palpitations-have follow-up with cardiology in the next couple of weeks to review her cardiac monitor.   Bad breath-recommend salt water gargles at bedtime. It is possible that she has had some new tonsillar stones form so gargling should help.

## 2016-04-14 DIAGNOSIS — E269 Hyperaldosteronism, unspecified: Secondary | ICD-10-CM | POA: Diagnosis not present

## 2016-04-14 DIAGNOSIS — E785 Hyperlipidemia, unspecified: Secondary | ICD-10-CM | POA: Diagnosis not present

## 2016-04-14 DIAGNOSIS — I471 Supraventricular tachycardia: Secondary | ICD-10-CM | POA: Diagnosis not present

## 2016-04-14 DIAGNOSIS — G4733 Obstructive sleep apnea (adult) (pediatric): Secondary | ICD-10-CM | POA: Diagnosis not present

## 2016-04-14 DIAGNOSIS — I472 Ventricular tachycardia: Secondary | ICD-10-CM | POA: Diagnosis not present

## 2016-04-18 ENCOUNTER — Emergency Department (HOSPITAL_BASED_OUTPATIENT_CLINIC_OR_DEPARTMENT_OTHER)
Admission: EM | Admit: 2016-04-18 | Discharge: 2016-04-18 | Disposition: A | Discharge status: Home / Self Care | Payer: Medicare Other | Attending: Emergency Medicine | Admitting: Emergency Medicine

## 2016-04-18 ENCOUNTER — Encounter (HOSPITAL_BASED_OUTPATIENT_CLINIC_OR_DEPARTMENT_OTHER): Payer: Self-pay

## 2016-04-18 DIAGNOSIS — J3489 Other specified disorders of nose and nasal sinuses: Secondary | ICD-10-CM | POA: Diagnosis not present

## 2016-04-18 DIAGNOSIS — Z87891 Personal history of nicotine dependence: Secondary | ICD-10-CM | POA: Diagnosis not present

## 2016-04-18 DIAGNOSIS — R0981 Nasal congestion: Secondary | ICD-10-CM

## 2016-04-18 DIAGNOSIS — J029 Acute pharyngitis, unspecified: Secondary | ICD-10-CM | POA: Insufficient documentation

## 2016-04-18 DIAGNOSIS — J45909 Unspecified asthma, uncomplicated: Secondary | ICD-10-CM | POA: Insufficient documentation

## 2016-04-18 DIAGNOSIS — R05 Cough: Secondary | ICD-10-CM | POA: Diagnosis not present

## 2016-04-18 DIAGNOSIS — R0602 Shortness of breath: Secondary | ICD-10-CM | POA: Diagnosis not present

## 2016-04-18 DIAGNOSIS — Z8542 Personal history of malignant neoplasm of other parts of uterus: Secondary | ICD-10-CM | POA: Diagnosis not present

## 2016-04-18 DIAGNOSIS — Z79899 Other long term (current) drug therapy: Secondary | ICD-10-CM | POA: Insufficient documentation

## 2016-04-18 DIAGNOSIS — I10 Essential (primary) hypertension: Secondary | ICD-10-CM | POA: Insufficient documentation

## 2016-04-18 DIAGNOSIS — R059 Cough, unspecified: Secondary | ICD-10-CM

## 2016-04-18 NOTE — ED Triage Notes (Signed)
C/o SOB, cough x 2-3 days-NAD-steady gait

## 2016-04-18 NOTE — ED Notes (Signed)
PA at bedside.
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
## 2016-04-18 NOTE — Discharge Instructions (Signed)
Call your primary care doctor tomorrow morning regarding the doxycycline and schedule appointment to be seen this week regarding your symptoms. Follow up with your pulmonologist also this week.  Return to the emergency department if you experience worsening shortness of breath, difficulty swallowing, fever, headache, or any other concerning symptoms.

## 2016-04-20 ENCOUNTER — Other Ambulatory Visit: Payer: Self-pay

## 2016-04-20 DIAGNOSIS — R5383 Other fatigue: Secondary | ICD-10-CM

## 2016-04-20 DIAGNOSIS — E876 Hypokalemia: Secondary | ICD-10-CM | POA: Diagnosis not present

## 2016-04-20 LAB — CBC AND DIFFERENTIAL
HCT: 43 % (ref 36–46)
Hemoglobin: 14.2 g/dL (ref 12.0–16.0)
Platelets: 65 10*3/uL — AB (ref 150–399)
WBC: 6.5 10^3/mL

## 2016-04-21 ENCOUNTER — Telehealth: Payer: Self-pay | Admitting: Family Medicine

## 2016-04-21 DIAGNOSIS — B353 Tinea pedis: Secondary | ICD-10-CM | POA: Diagnosis not present

## 2016-04-21 DIAGNOSIS — R5383 Other fatigue: Secondary | ICD-10-CM | POA: Diagnosis not present

## 2016-04-21 DIAGNOSIS — I10 Essential (primary) hypertension: Secondary | ICD-10-CM | POA: Diagnosis not present

## 2016-04-21 DIAGNOSIS — I471 Supraventricular tachycardia: Secondary | ICD-10-CM | POA: Diagnosis not present

## 2016-04-21 DIAGNOSIS — Z8249 Family history of ischemic heart disease and other diseases of the circulatory system: Secondary | ICD-10-CM | POA: Diagnosis not present

## 2016-04-21 DIAGNOSIS — Z87891 Personal history of nicotine dependence: Secondary | ICD-10-CM | POA: Diagnosis not present

## 2016-04-21 DIAGNOSIS — Z885 Allergy status to narcotic agent status: Secondary | ICD-10-CM | POA: Diagnosis not present

## 2016-04-21 DIAGNOSIS — Z888 Allergy status to other drugs, medicaments and biological substances status: Secondary | ICD-10-CM | POA: Diagnosis not present

## 2016-04-21 DIAGNOSIS — E785 Hyperlipidemia, unspecified: Secondary | ICD-10-CM | POA: Diagnosis not present

## 2016-04-21 DIAGNOSIS — Z886 Allergy status to analgesic agent status: Secondary | ICD-10-CM | POA: Diagnosis not present

## 2016-04-21 DIAGNOSIS — R0789 Other chest pain: Secondary | ICD-10-CM | POA: Diagnosis not present

## 2016-04-21 DIAGNOSIS — R739 Hyperglycemia, unspecified: Secondary | ICD-10-CM | POA: Diagnosis not present

## 2016-04-21 DIAGNOSIS — E1165 Type 2 diabetes mellitus with hyperglycemia: Secondary | ICD-10-CM | POA: Diagnosis not present

## 2016-04-21 IMAGING — US US EXTREM  UP VENOUS*R*
1 series · 13 of 24 positions shown · non-contrast
Comparison: None.

CLINICAL DATA: 49-year-old female with 1 week history of right
axillary pain and swelling



[Series 1: us extrem up venous*right* · 0.07mm/px · 13 of 37 slices shown]
[im 1/37]
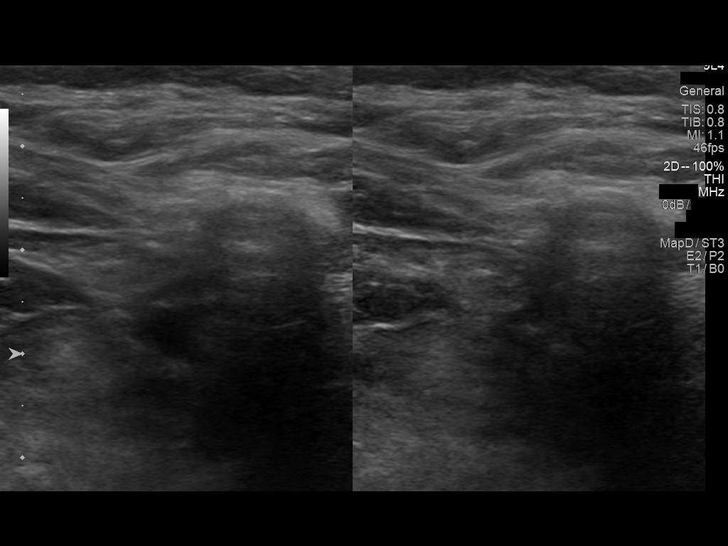
[im 4/37]
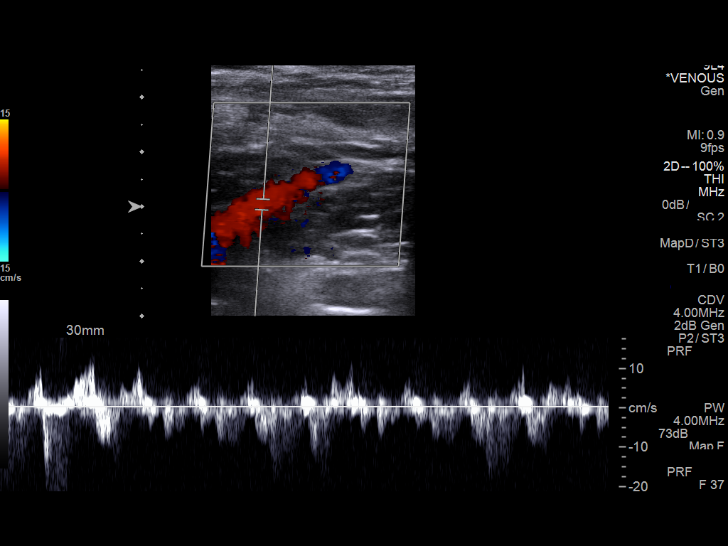
[im 7/37]
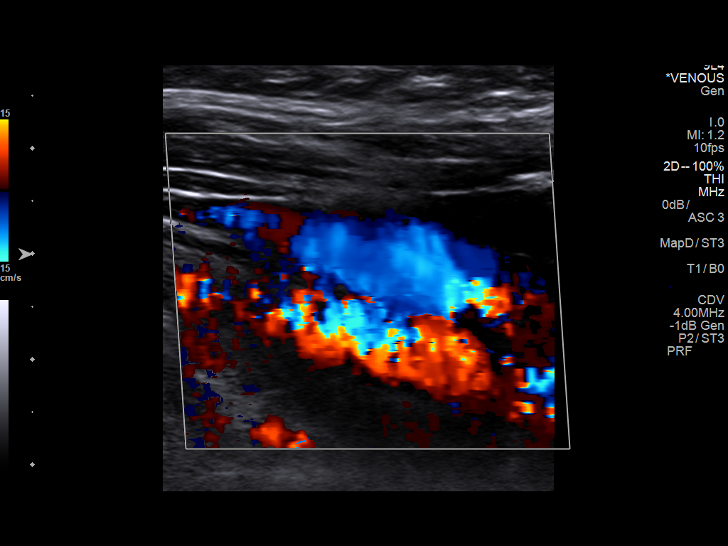
[im 10/37]
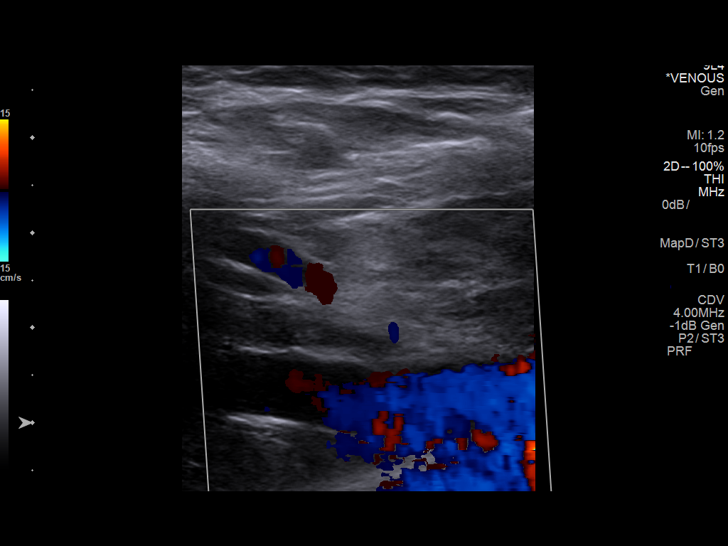
[im 13/37]
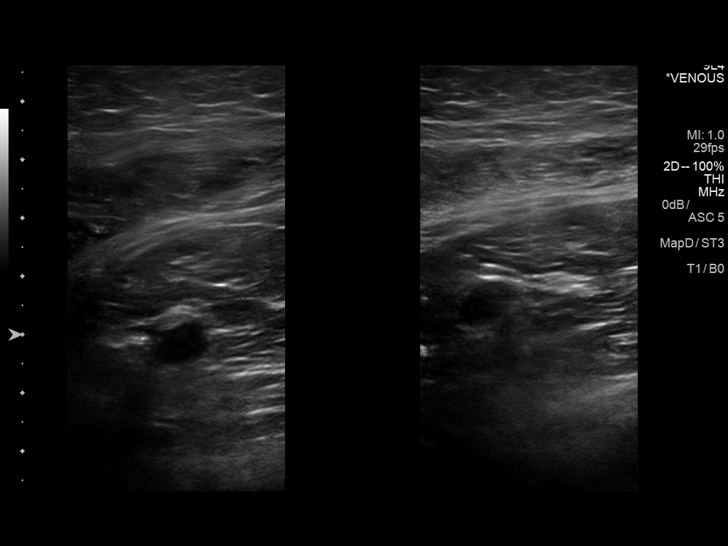
[im 16/37]
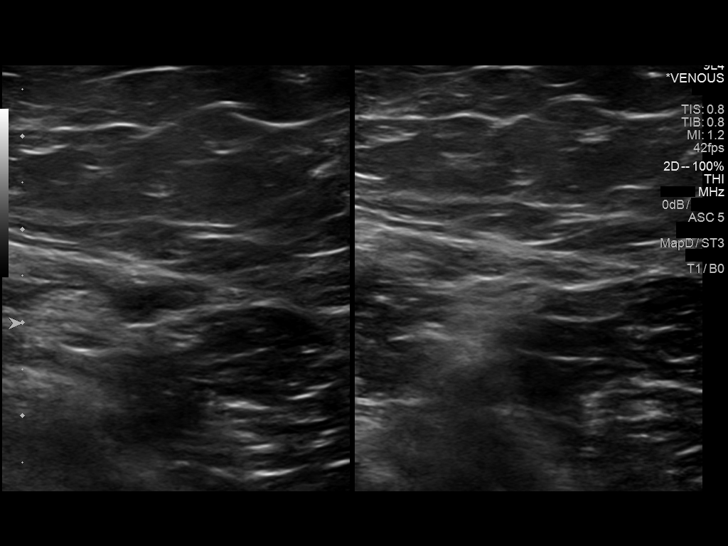
[im 19/37]
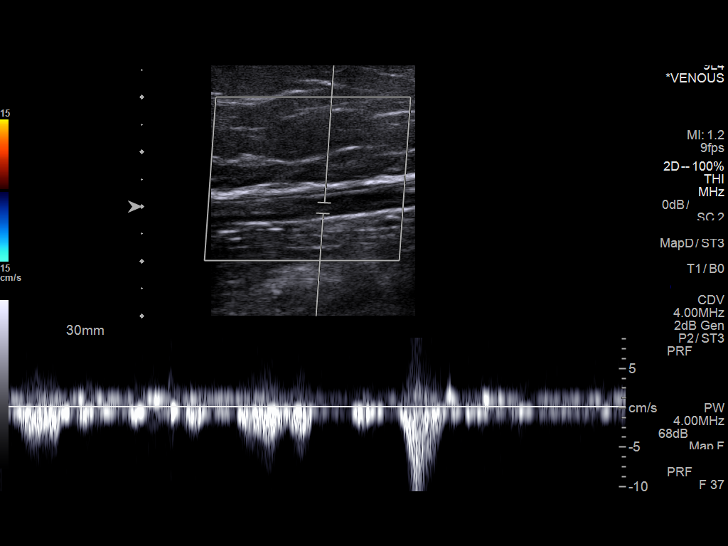
[im 21/37]
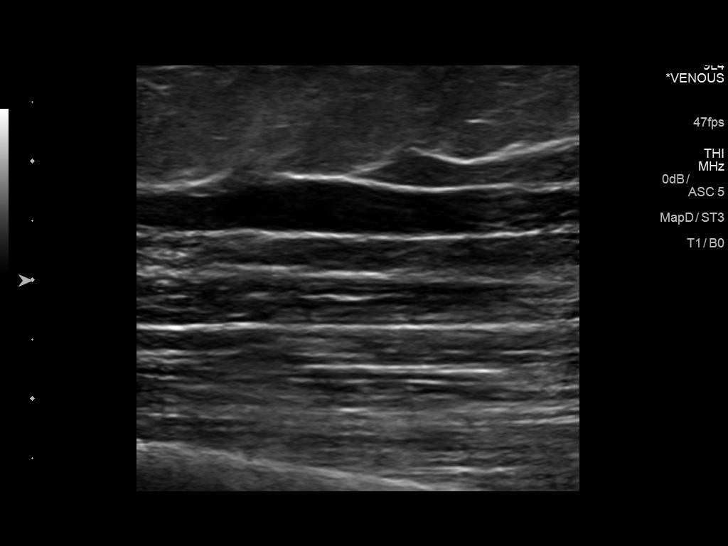
[im 24/37]
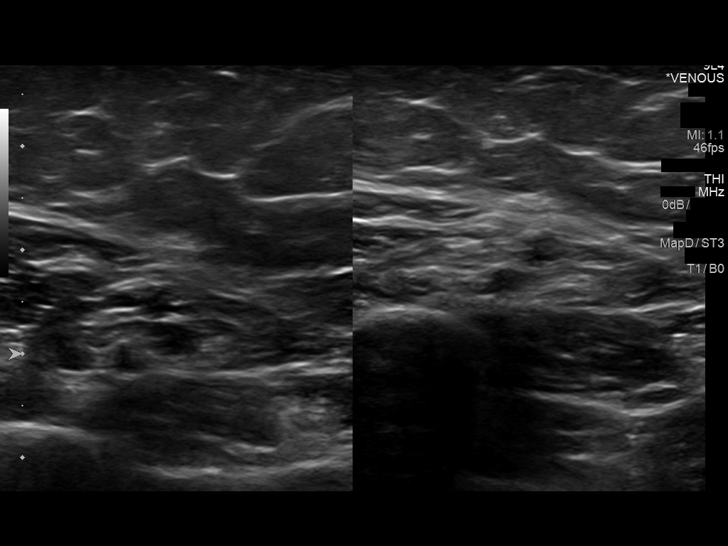
[im 27/37]
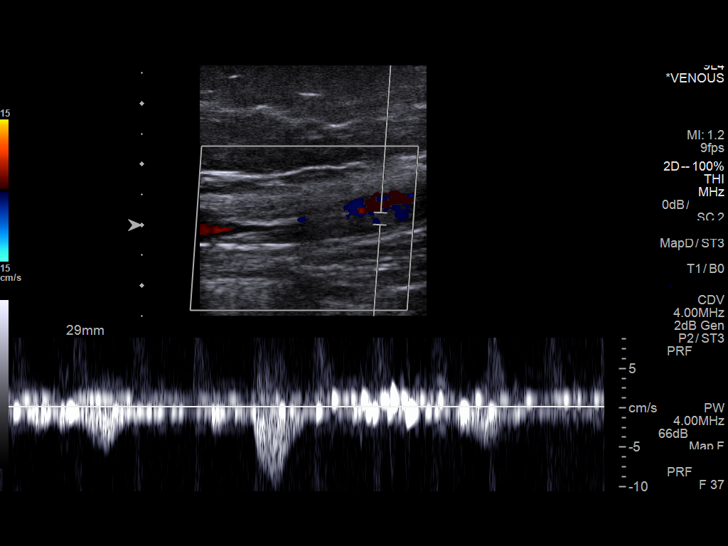
[im 30/37]
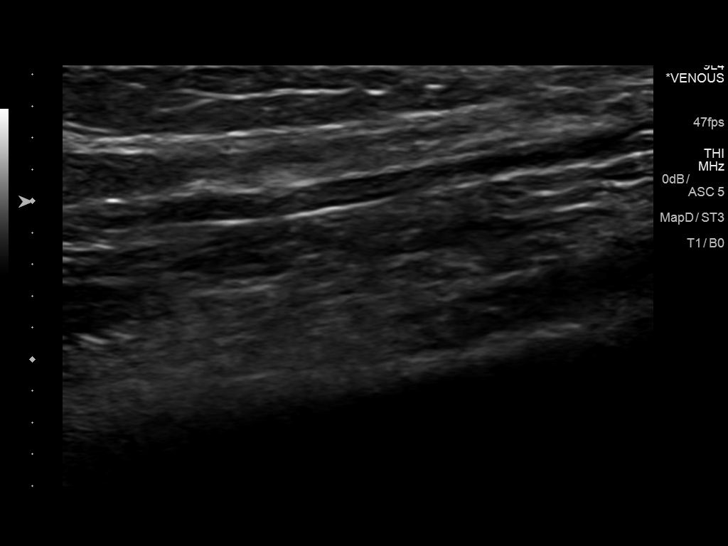
[im 33/37]
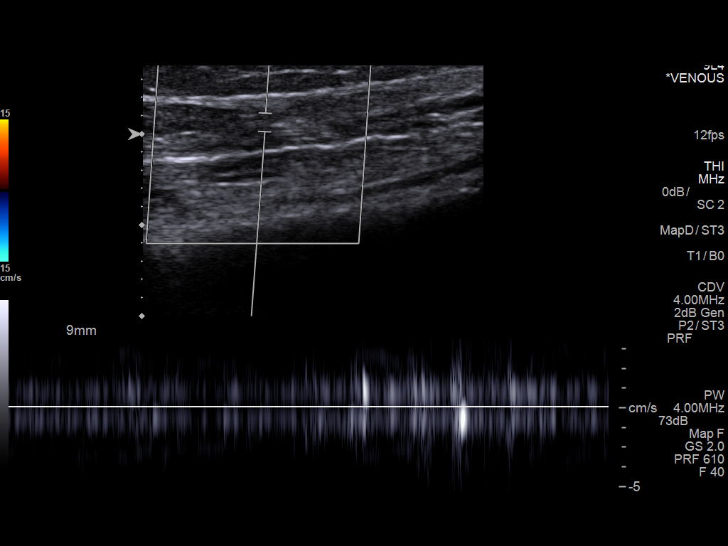
[im 37/37]
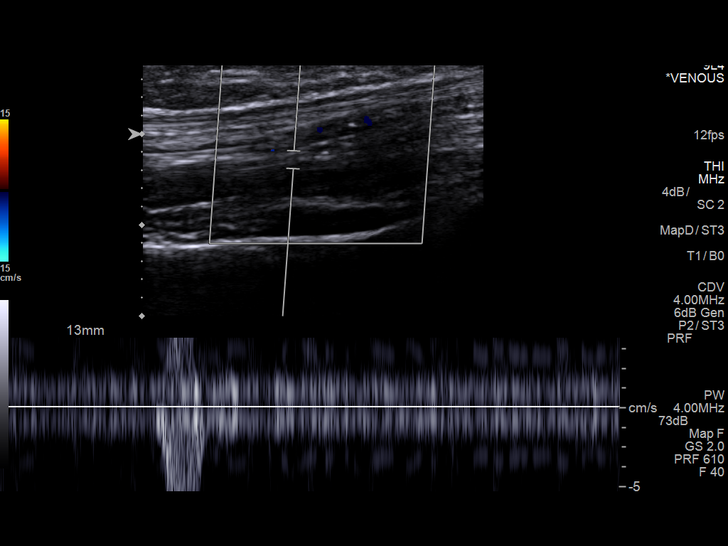

[13 of 24 positions shown; findings below may reference images not displayed]

FINDINGS: Contralateral Subclavian Vein: Respiratory phasicity is normal and
symmetric with the symptomatic side. No evidence of thrombus. Normal
compressibility.

Internal Jugular Vein: No evidence of thrombus. Normal
compressibility, respiratory phasicity and response to augmentation.

Subclavian Vein: No evidence of thrombus. Normal compressibility,
respiratory phasicity and response to augmentation.

Axillary Vein: No evidence of thrombus. Normal compressibility,
respiratory phasicity and response to augmentation.

Cephalic Vein: No evidence of thrombus. Normal compressibility,
respiratory phasicity and response to augmentation.

Basilic Vein: No evidence of thrombus. Normal compressibility,
respiratory phasicity and response to augmentation.

Brachial Veins: No evidence of thrombus. Normal compressibility,
respiratory phasicity and response to augmentation.

Radial Veins: No evidence of thrombus. Normal compressibility,
respiratory phasicity and response to augmentation.

Ulnar Veins: No evidence of thrombus. Normal compressibility,
respiratory phasicity and response to augmentation.

Venous Reflux:  None visualized.

Other Findings:  None visualized.
IMPRESSION: No evidence of deep venous thrombosis.

## 2016-04-21 NOTE — Telephone Encounter (Signed)
Call patient: Received her lab work this morning. Electrolytes are normal. Kidney function looks fantastic. Glucose was 129 but I'm assuming she wasn't fasting. Blood cell count is normal. Hemoglobin looks great. No anemia or sign of acute infection or difficulties with bleeding.

## 2016-04-21 NOTE — Telephone Encounter (Addendum)
Pt informed of results. She informed me that she is at [facility] now. She stated that she is still not feeling any better and that the doxy wasn't sent.Maryruth Eve, [place] Crocker

## 2016-04-21 NOTE — Telephone Encounter (Addendum)
Jennifer [name] called back and states she went to [facility] ED. Her glucose was 202 mg/dl. She wanted to come in for an appointment. I told her she could wait until her appointment next Wednesday.

## 2016-04-25 DIAGNOSIS — I10 Essential (primary) hypertension: Secondary | ICD-10-CM | POA: Diagnosis not present

## 2016-04-25 DIAGNOSIS — R002 Palpitations: Secondary | ICD-10-CM | POA: Diagnosis not present

## 2016-04-25 DIAGNOSIS — R079 Chest pain, unspecified: Secondary | ICD-10-CM | POA: Diagnosis not present

## 2016-04-25 DIAGNOSIS — R0602 Shortness of breath: Secondary | ICD-10-CM | POA: Diagnosis not present

## 2016-04-25 DIAGNOSIS — E785 Hyperlipidemia, unspecified: Secondary | ICD-10-CM | POA: Diagnosis not present

## 2016-04-25 DIAGNOSIS — R1013 Epigastric pain: Secondary | ICD-10-CM | POA: Diagnosis not present

## 2016-04-25 DIAGNOSIS — Z888 Allergy status to other drugs, medicaments and biological substances status: Secondary | ICD-10-CM | POA: Diagnosis not present

## 2016-04-25 DIAGNOSIS — Z885 Allergy status to narcotic agent status: Secondary | ICD-10-CM | POA: Diagnosis not present

## 2016-04-25 DIAGNOSIS — R5383 Other fatigue: Secondary | ICD-10-CM | POA: Diagnosis not present

## 2016-04-25 DIAGNOSIS — Z886 Allergy status to analgesic agent status: Secondary | ICD-10-CM | POA: Diagnosis not present

## 2016-04-25 DIAGNOSIS — G35 Multiple sclerosis: Secondary | ICD-10-CM | POA: Diagnosis not present

## 2016-04-25 DIAGNOSIS — Z8249 Family history of ischemic heart disease and other diseases of the circulatory system: Secondary | ICD-10-CM | POA: Diagnosis not present

## 2016-04-26 ENCOUNTER — Encounter: Payer: Self-pay | Admitting: Family Medicine

## 2016-04-26 ENCOUNTER — Ambulatory Visit (INDEPENDENT_AMBULATORY_CARE_PROVIDER_SITE_OTHER): Payer: Medicare Other | Admitting: Family Medicine

## 2016-04-26 VITALS — BP 144/82 | HR 105 | Ht 62.0 in | Wt 207.0 lb

## 2016-04-26 DIAGNOSIS — N644 Mastodynia: Secondary | ICD-10-CM

## 2016-04-26 DIAGNOSIS — R0602 Shortness of breath: Secondary | ICD-10-CM

## 2016-04-26 DIAGNOSIS — R220 Localized swelling, mass and lump, head: Secondary | ICD-10-CM

## 2016-04-26 DIAGNOSIS — I10 Essential (primary) hypertension: Secondary | ICD-10-CM | POA: Diagnosis not present

## 2016-04-26 MED ORDER — EPINEPHRINE 0.3 MG/0.3ML IJ SOAJ: 0.3000 mg | INTRAMUSCULAR | 1 refills | Status: DC | PRN

## 2016-04-26 NOTE — Progress Notes (Signed)
Subjective:    CC: HTN, check tongue today.   HPI:  Patient comes in today complaining that she was feeling weak and tired and like her arms were heavy. She felt like it was coming from her labetalol so she called her cardiologist, Dr. act very and they have now switched her to clonidine. She darted it yesterday. She also started a new medication called flecainide. She started that on September 1. She says yesterday she noticed that she had some ridges on the sides her time and was concerned that her tongue might be starting to swell. She's had this reaction to medications in the past. She says she has avoided milk products in general except for a couple of days ago.  She also complains of a pain/ache in her right breast. She says she normally does self breast exams and has not felt any nodules per se. But she says when she was checking her breast it actually made the pain worse. She feels like it actually feels swollen on the lateral edge of the breast into the right axilla. She's had problems with swelling in her right axilla before on and off over the years that I have known her. She says this morning she noticed a vein that look like it was bulging more. She says her sister was recently diagnosed with a breast lump and is undergoing biopsy.  Past medical history, Surgical history, Family history not pertinant except as noted below, Social history, Allergies, and medications have been entered into the medical record, reviewed, and corrections made.   Review of Systems: No fevers, chills, night sweats, weight loss, chest pain, or shortness of breath.   Objective:    General: Well Developed, well nourished, and in no acute distress.  Neuro: Alert and oriented x3, extra-ocular muscles intact, sensation grossly intact.  HEENT: Normocephalic, atraumatic. OP is clear.  Some slight indenction on side of tongue from her teeth.  No redness or rash. TMs and canals are clear bilaterall. No sig cervical  LNs.   Skin: Warm and dry, no rashes. Cardiac: Regular rate and rhythm, no murmurs rubs or gallops, no lower extremity edema.  Respiratory: Clear to auscultation bilaterally. Not using accessory muscles, speaking in full sentences.   Impression and Recommendations:   Right breast pain with axillary swelling-we'll go ahead and schedule for diagnostic mammogram and ultrasound. She was due for her repeat screening mammogram in about 3 weeks anyway. She's had intermittent swelling in the axilla before it usually seems to resolve on its own.  Shortness of breath/fatigue-she does feel like it's a little bit better off of the beta blocker.  Tongue swelling. She does have some slight indentations from her teeth on the edge of her tongue. I actually encouraged her just to keep an eye on it in the next couple of days and see if it worsens. Avoid any other foods that may be triggering her symptoms.  HTN - not at goal today but she is transition meds.

## 2016-04-27 IMAGING — CR DG LUMBAR SPINE COMPLETE 4+V
5 series · 5 of 5 positions shown · non-contrast
Comparison: CT abdomen pelvis of 10/17/2014

CLINICAL DATA: Slipped and fell over the weekend injuring her lower
back now with some pain and stiffness

EXAM:
LUMBAR SPINE - COMPLETE 4+ VIEW

[l-spine ap]
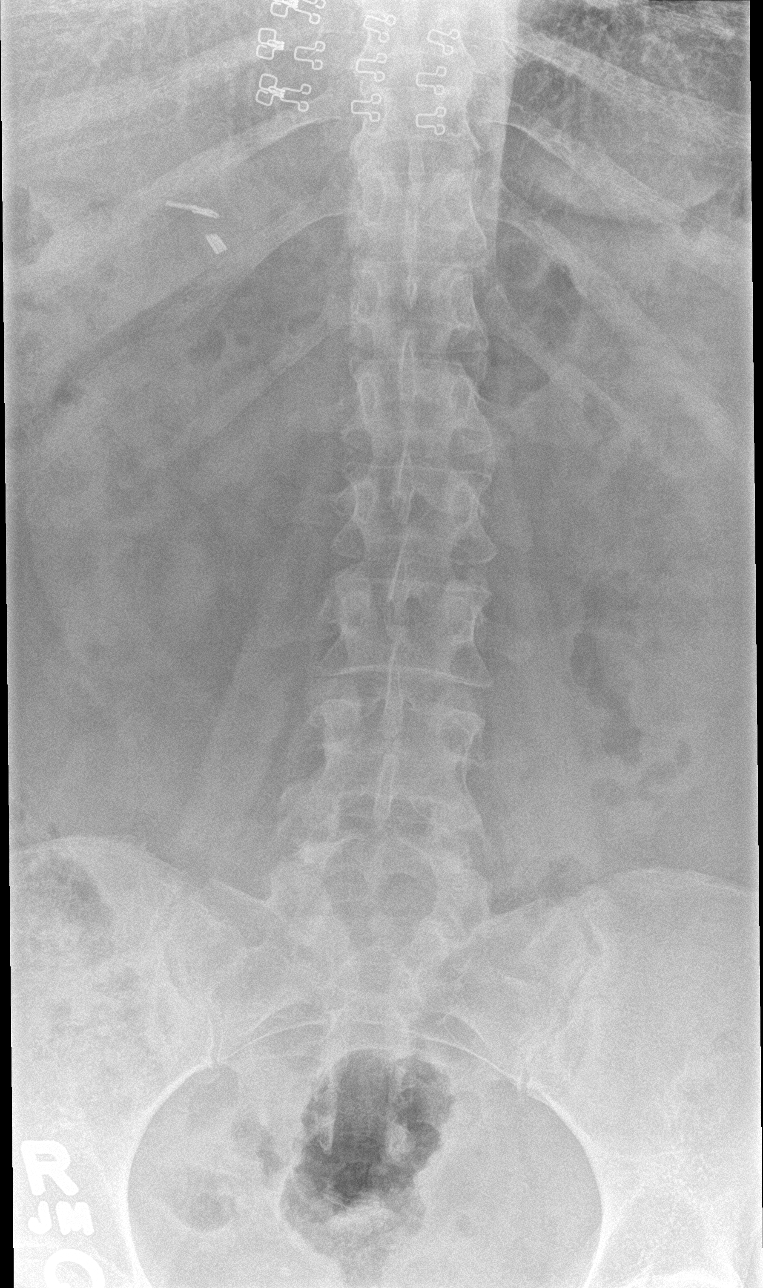

[l-spine obl (1 of 2)]
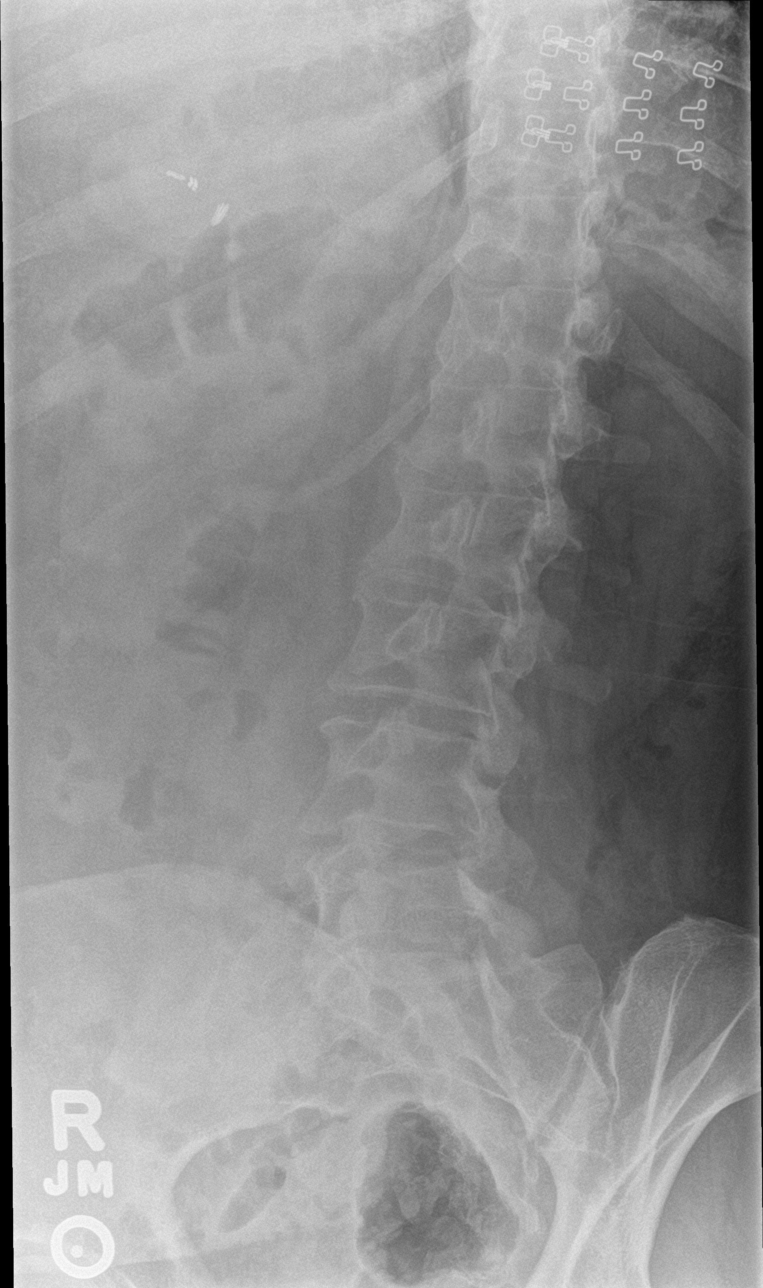

[l-spine obl (2 of 2)]
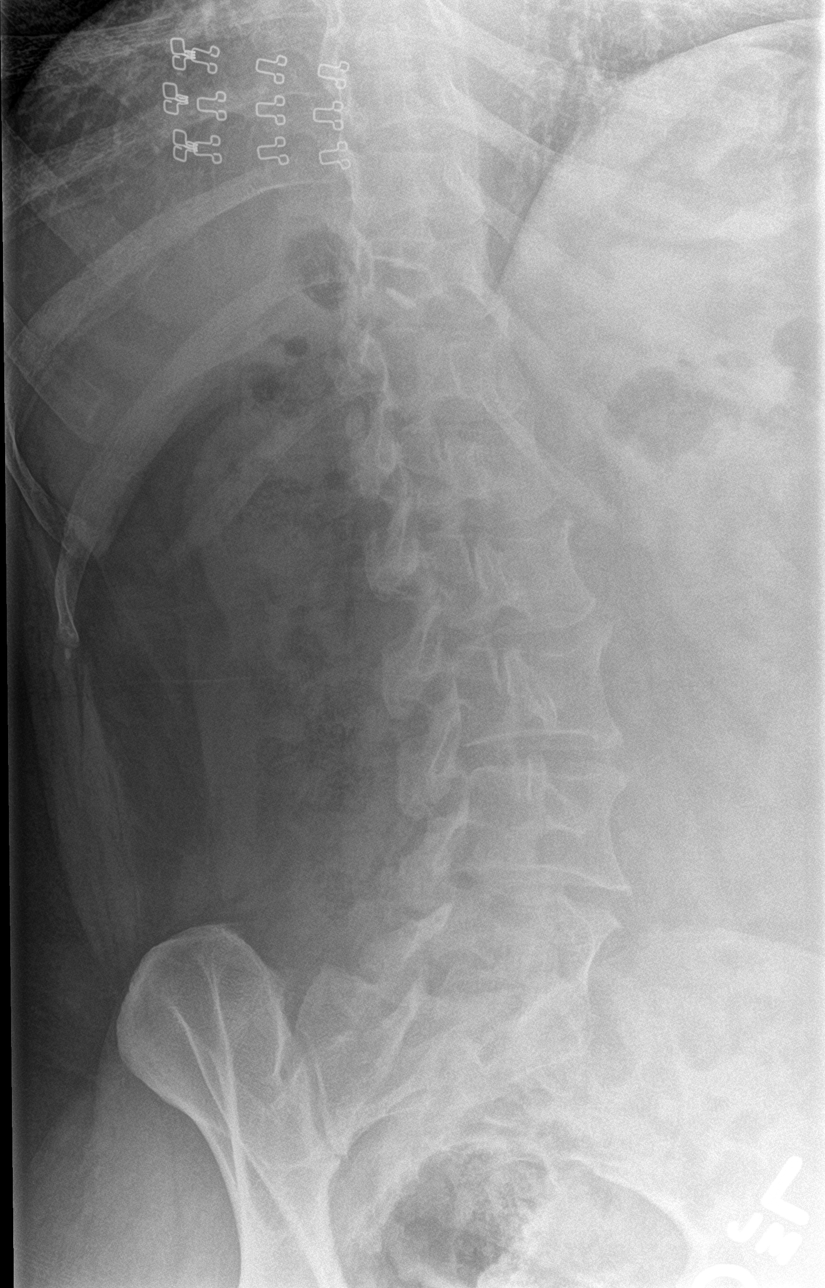

[l-spine lat]
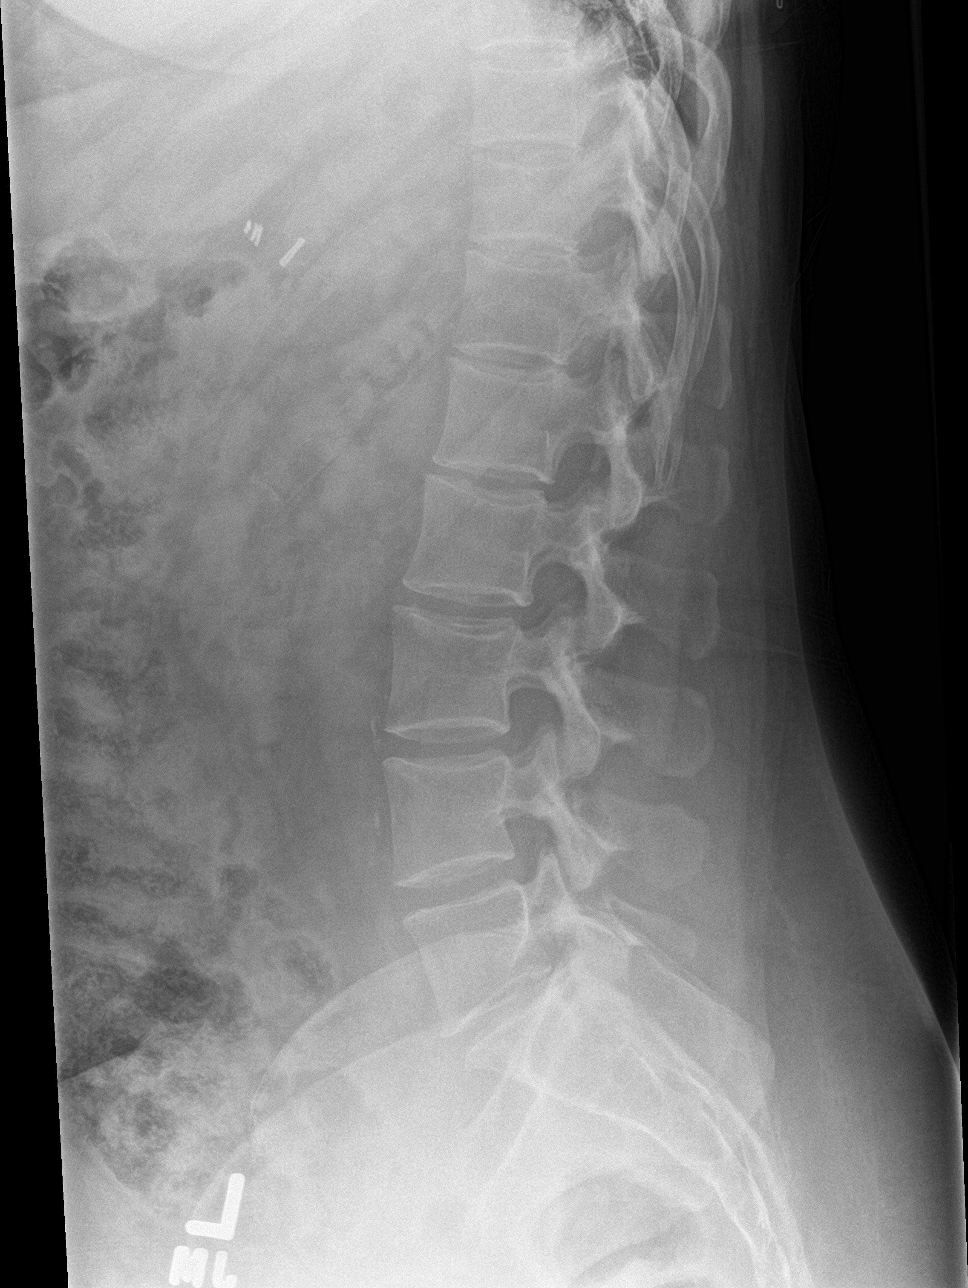

[l-spine spot]
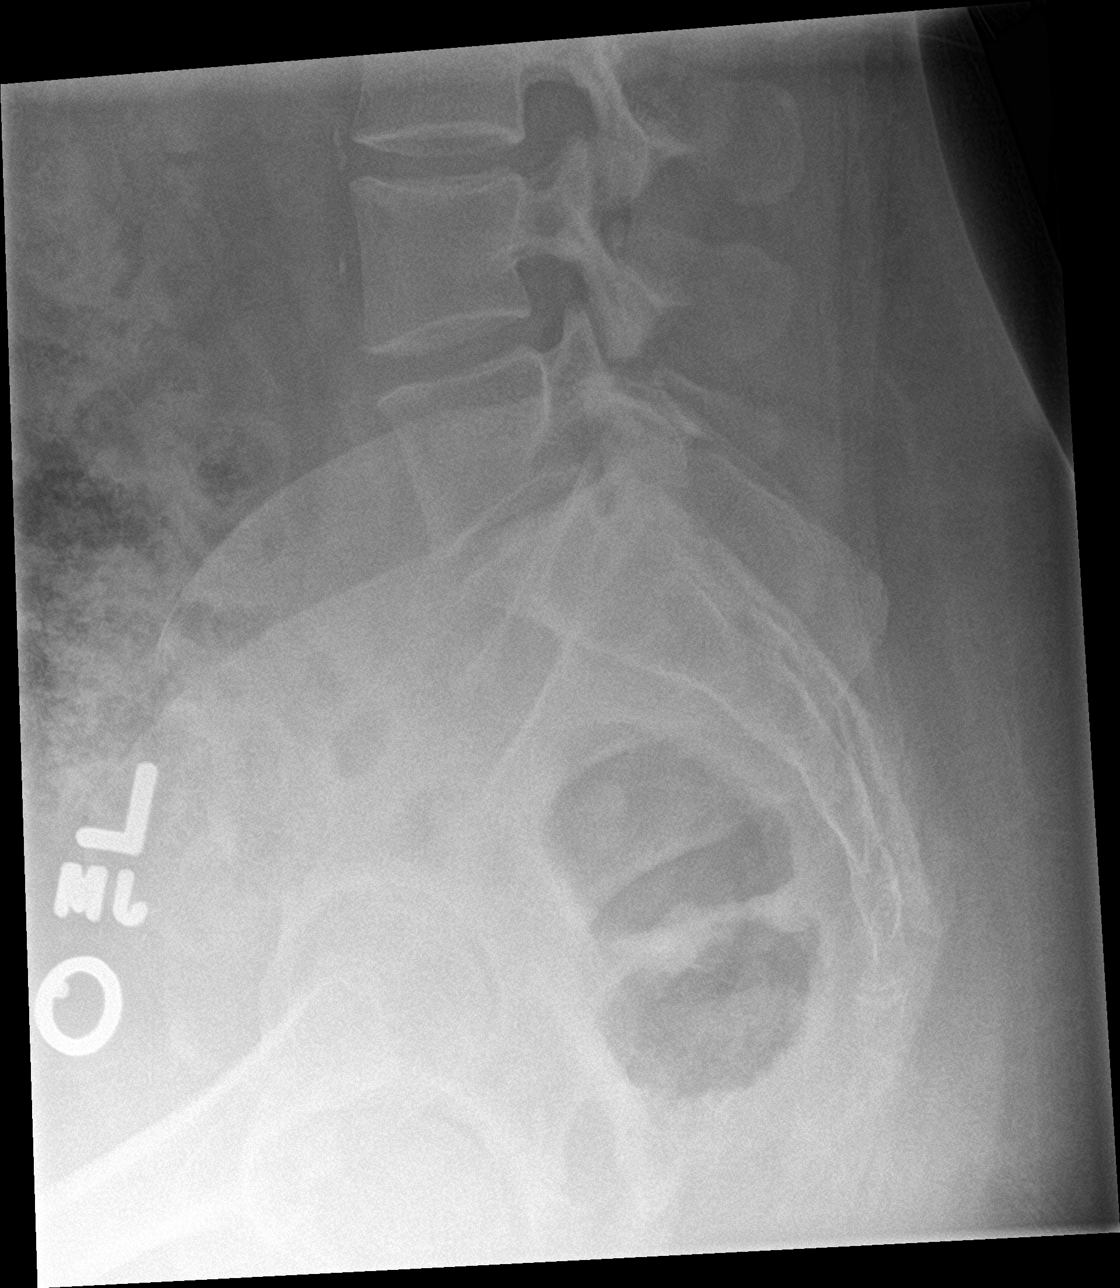

[5 of 5 positions shown; findings below may reference images not displayed]

FINDINGS: The lumbar vertebrae are in normal alignment. Intervertebral disc
spaces are within normal limits with perhaps minimally decreased
disc space at L5-S1. No significant degenerative change is seen. The
SI joints are corticated.
IMPRESSION: Normal alignment.  Very minimal decrease in disc space at L5-S1

## 2016-04-28 ENCOUNTER — Telehealth: Payer: Self-pay | Admitting: *Deleted

## 2016-04-28 ENCOUNTER — Other Ambulatory Visit: Payer: Self-pay

## 2016-04-28 DIAGNOSIS — E876 Hypokalemia: Secondary | ICD-10-CM | POA: Diagnosis not present

## 2016-04-28 DIAGNOSIS — R0602 Shortness of breath: Secondary | ICD-10-CM

## 2016-04-28 DIAGNOSIS — N644 Mastodynia: Secondary | ICD-10-CM

## 2016-04-28 LAB — D-DIMER, QUANTITATIVE: D-dimer: <0.27

## 2016-04-28 LAB — BASIC METABOLIC PANEL
BUN: 17 mg/dL (ref 4–21)
Creatinine: 0.5 mg/dL (ref 0.5–1.1)
Glucose: 136 mg/dL
Sodium: 141 mmol/L (ref 137–147)

## 2016-04-28 MED ORDER — ALBUTEROL SULFATE HFA 108 (90 BASE) MCG/ACT IN AERS: 1.0000 | INHALATION_SPRAY | Freq: Four times a day (QID) | RESPIRATORY_TRACT | 0 refills | Status: DC | PRN

## 2016-04-28 NOTE — Telephone Encounter (Signed)
Spoke with pt and she stated that she is having some feeling tired and having sob. She states that she hasn't been feeling well all week. [phone]

## 2016-05-02 ENCOUNTER — Telehealth: Payer: Self-pay | Admitting: Allergy

## 2016-05-02 ENCOUNTER — Other Ambulatory Visit: Payer: Self-pay | Admitting: Allergy

## 2016-05-02 MED ORDER — LEVALBUTEROL TARTRATE 45 MCG/ACT IN AERO: 2.0000 | INHALATION_SPRAY | Freq: Four times a day (QID) | RESPIRATORY_TRACT | 1 refills | Status: DC | PRN

## 2016-05-02 NOTE — Telephone Encounter (Signed)
done

## 2016-05-02 NOTE — Telephone Encounter (Signed)
Patient called said she needed a letter that she was tested for mold and is allergic to it  And can not be in it or clean it because she can't breath.  She says her place where she is living now has mold. She is on two new heart  medications. Slecainide Acetate  She is taking 25mg  twice a day but will be increasing it. And also Clondine 0.1mg   twice a day.

## 2016-05-04 ENCOUNTER — Telehealth: Payer: Self-pay | Admitting: Family Medicine

## 2016-05-04 DIAGNOSIS — Z87891 Personal history of nicotine dependence: Secondary | ICD-10-CM | POA: Diagnosis not present

## 2016-05-04 DIAGNOSIS — G4730 Sleep apnea, unspecified: Secondary | ICD-10-CM | POA: Diagnosis not present

## 2016-05-04 DIAGNOSIS — K760 Fatty (change of) liver, not elsewhere classified: Secondary | ICD-10-CM | POA: Diagnosis not present

## 2016-05-04 DIAGNOSIS — D649 Anemia, unspecified: Secondary | ICD-10-CM | POA: Diagnosis not present

## 2016-05-04 DIAGNOSIS — R1011 Right upper quadrant pain: Secondary | ICD-10-CM | POA: Diagnosis not present

## 2016-05-04 DIAGNOSIS — I10 Essential (primary) hypertension: Secondary | ICD-10-CM | POA: Diagnosis not present

## 2016-05-04 DIAGNOSIS — E785 Hyperlipidemia, unspecified: Secondary | ICD-10-CM | POA: Diagnosis not present

## 2016-05-04 DIAGNOSIS — Z9049 Acquired absence of other specified parts of digestive tract: Secondary | ICD-10-CM | POA: Diagnosis not present

## 2016-05-04 DIAGNOSIS — Z888 Allergy status to other drugs, medicaments and biological substances status: Secondary | ICD-10-CM | POA: Diagnosis not present

## 2016-05-04 DIAGNOSIS — Z8249 Family history of ischemic heart disease and other diseases of the circulatory system: Secondary | ICD-10-CM | POA: Diagnosis not present

## 2016-05-04 DIAGNOSIS — R531 Weakness: Secondary | ICD-10-CM | POA: Diagnosis not present

## 2016-05-04 DIAGNOSIS — Z885 Allergy status to narcotic agent status: Secondary | ICD-10-CM | POA: Diagnosis not present

## 2016-05-04 DIAGNOSIS — G35 Multiple sclerosis: Secondary | ICD-10-CM | POA: Diagnosis not present

## 2016-05-04 DIAGNOSIS — R109 Unspecified abdominal pain: Secondary | ICD-10-CM | POA: Diagnosis not present

## 2016-05-04 DIAGNOSIS — Z886 Allergy status to analgesic agent status: Secondary | ICD-10-CM | POA: Diagnosis not present

## 2016-05-04 DIAGNOSIS — R110 Nausea: Secondary | ICD-10-CM | POA: Diagnosis not present

## 2016-05-04 NOTE — Telephone Encounter (Signed)
Letter done. Informed patient.

## 2016-05-04 NOTE — Telephone Encounter (Signed)
Call patient: Let patient know that labs are normal and d-dimer was negative so no sign of blood clot.

## 2016-05-05 NOTE — Telephone Encounter (Signed)
Left VM of results, callback provided for any questions/concerns.

## 2016-05-10 ENCOUNTER — Ambulatory Visit (INDEPENDENT_AMBULATORY_CARE_PROVIDER_SITE_OTHER): Payer: Medicare Other | Admitting: Family Medicine

## 2016-05-10 ENCOUNTER — Encounter: Payer: Self-pay | Admitting: Family Medicine

## 2016-05-10 VITALS — BP 147/79 | HR 99 | Wt 203.0 lb

## 2016-05-10 DIAGNOSIS — I472 Ventricular tachycardia, unspecified: Secondary | ICD-10-CM

## 2016-05-10 DIAGNOSIS — R251 Tremor, unspecified: Secondary | ICD-10-CM | POA: Diagnosis not present

## 2016-05-10 DIAGNOSIS — I10 Essential (primary) hypertension: Secondary | ICD-10-CM

## 2016-05-10 DIAGNOSIS — I4729 Other ventricular tachycardia: Secondary | ICD-10-CM

## 2016-05-10 DIAGNOSIS — R7301 Impaired fasting glucose: Secondary | ICD-10-CM | POA: Diagnosis not present

## 2016-05-10 DIAGNOSIS — R1011 Right upper quadrant pain: Secondary | ICD-10-CM

## 2016-05-10 LAB — POCT GLYCOSYLATED HEMOGLOBIN (HGB A1C): Hemoglobin A1C: 5.7

## 2016-05-10 NOTE — Progress Notes (Signed)
Subjective:    CC: HTN  HPI:  Hypertension- Pt denies chest pain, SOB, dizziness, or heart palpitations.  Taking meds as directed w/o problems.  Denies medication side effects.  Now on clonidine for control.  Palpitations-she is now on flecainide. She started getting some right upper quadrant pain and went to the emergency department. She was told that it can sometimes cause liver pain so she decided to stop it. She's been off of it for about a week. She says she still not feeling great. Also couple of days ago she experienced a slight tremor. She is worried it is from the medication. She was assured bowels from the medication either.  He also wanted to ask me about donating her body to science when she dies and whether or not I thought that was a good idea or not.    Past medical history, Surgical history, Family history not pertinant except as noted below, Social history, Allergies, and medications have been entered into the medical record, reviewed, and corrections made.   Review of Systems: No fevers, chills, night sweats, weight loss, chest pain, or shortness of breath.   Objective:    General: Well Developed, well nourished, and in no acute distress.  Neuro: Alert and oriented x3, extra-ocular muscles intact, sensation grossly intact.  HEENT: Normocephalic, atraumatic  Skin: Warm and dry, no rashes. Cardiac: Regular rate and rhythm, no murmurs rubs or gallops, no lower extremity edema.  Respiratory: Clear to auscultation bilaterally. Not using accessory muscles, speaking in full sentences.   Impression and Recommendations:     Hypertension-blood pressure mildly elevated today though she just took her medication right before coming today. She'll follow up in 2 weeks so we can recheck at that time.  Paroxysmal ventricular tachycardia-she's been off the flecainide for about a week because she was concerned it was causing some symptoms.  I reassured her that I was not aware of that  medication actually causing liver pain. I think it would be fine for her to restart it.  Tremor-flecainidecan cause a tremor but she guarded and off the medication for about 5 days before she experiences tremors I think it's unlikely to be related. Explained the clonidine does not typically cause a tremor. I think it may have been from something else. Either increased activity, increased adrenaline from stress, or possibly a blood sugar issue.  Abdominal pain seems to have improved. Reminded her that she often gets abdominal pain completely unrelated to her current medications. It seems to flare from time to time depending on her diet and depending on if she is struggling with her constipation.

## 2016-05-12 ENCOUNTER — Encounter (HOSPITAL_BASED_OUTPATIENT_CLINIC_OR_DEPARTMENT_OTHER): Payer: Self-pay | Admitting: *Deleted

## 2016-05-12 ENCOUNTER — Emergency Department (HOSPITAL_BASED_OUTPATIENT_CLINIC_OR_DEPARTMENT_OTHER)
Admission: EM | Admit: 2016-05-12 | Discharge: 2016-05-12 | Disposition: A | Discharge status: Home / Self Care | Payer: Medicare Other | Attending: Emergency Medicine | Admitting: Emergency Medicine

## 2016-05-12 DIAGNOSIS — I10 Essential (primary) hypertension: Secondary | ICD-10-CM | POA: Insufficient documentation

## 2016-05-12 DIAGNOSIS — J45909 Unspecified asthma, uncomplicated: Secondary | ICD-10-CM | POA: Diagnosis not present

## 2016-05-12 DIAGNOSIS — Z79899 Other long term (current) drug therapy: Secondary | ICD-10-CM | POA: Insufficient documentation

## 2016-05-12 DIAGNOSIS — R002 Palpitations: Secondary | ICD-10-CM | POA: Diagnosis not present

## 2016-05-12 DIAGNOSIS — Z87891 Personal history of nicotine dependence: Secondary | ICD-10-CM | POA: Diagnosis not present

## 2016-05-12 DIAGNOSIS — Z7951 Long term (current) use of inhaled steroids: Secondary | ICD-10-CM | POA: Diagnosis not present

## 2016-05-12 LAB — COMPREHENSIVE METABOLIC PANEL
ALT: 29 U/L (ref 14–54)
AST: 18 U/L (ref 15–41)
Albumin: 4.1 g/dL (ref 3.5–5.0)
Alkaline Phosphatase: 84 U/L (ref 38–126)
Anion gap: 7 (ref 5–15)
BUN: 14 mg/dL (ref 6–20)
CO2: 24 mmol/L (ref 22–32)
Calcium: 9 mg/dL (ref 8.9–10.3)
Chloride: 110 mmol/L (ref 101–111)
Creatinine, Ser: 0.62 mg/dL (ref 0.44–1.00)
GFR calc Af Amer: >60 mL/min (ref >60)
GFR calc non Af Amer: >60 mL/min (ref >60)
Glucose, Bld: 85 mg/dL (ref 65–99)
Potassium: 3.6 mmol/L (ref 3.5–5.1)
Sodium: 141 mmol/L (ref 135–145)
Total Bilirubin: 0.4 mg/dL (ref 0.3–1.2)
Total Protein: 7.3 g/dL (ref 6.5–8.1)

## 2016-05-12 LAB — CBC WITH DIFFERENTIAL/PLATELET
Basophils Absolute: 0 10*3/uL (ref 0.0–0.1)
Basophils Relative: 1 %
Eosinophils Absolute: 0.1 10*3/uL (ref 0.0–0.7)
Eosinophils Relative: 2 %
HCT: 44.1 % (ref 36.0–46.0)
Hemoglobin: 14.6 g/dL (ref 12.0–15.0)
Lymphocytes Relative: 24 %
Lymphs Abs: 1.4 10*3/uL (ref 0.7–4.0)
MCH: 29.9 pg (ref 26.0–34.0)
MCHC: 33.1 g/dL (ref 30.0–36.0)
MCV: 90.4 fL (ref 78.0–100.0)
Monocytes Absolute: 0.7 10*3/uL (ref 0.1–1.0)
Monocytes Relative: 12 %
Neutro Abs: 3.6 10*3/uL (ref 1.7–7.7)
Neutrophils Relative %: 61 %
Platelets: 186 10*3/uL (ref 150–400)
RBC: 4.88 MIL/uL (ref 3.87–5.11)
RDW: 13.4 % (ref 11.5–15.5)
WBC: 5.8 10*3/uL (ref 4.0–10.5)

## 2016-05-12 MED ORDER — POTASSIUM CHLORIDE 20 MEQ/15ML (10%) PO SOLN
ORAL | Status: AC
  Filled 2016-05-12: qty 15

## 2016-05-12 MED ORDER — POTASSIUM CHLORIDE CRYS ER 20 MEQ PO TBCR
20.0000 meq | EXTENDED_RELEASE_TABLET | Freq: Two times a day (BID) | ORAL | Status: DC
  Administered 2016-05-12: 20 meq via ORAL
  Filled 2016-05-12: qty 1
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
## 2016-05-12 NOTE — ED Triage Notes (Signed)
Pt c/o palpitations x3 days

## 2016-05-15 ENCOUNTER — Encounter: Payer: Self-pay | Admitting: Family Medicine

## 2016-05-17 DIAGNOSIS — N6321 Unspecified lump in the left breast, upper outer quadrant: Secondary | ICD-10-CM | POA: Diagnosis not present

## 2016-05-17 DIAGNOSIS — G35 Multiple sclerosis: Secondary | ICD-10-CM | POA: Diagnosis not present

## 2016-05-17 DIAGNOSIS — R21 Rash and other nonspecific skin eruption: Secondary | ICD-10-CM | POA: Diagnosis not present

## 2016-05-17 DIAGNOSIS — R928 Other abnormal and inconclusive findings on diagnostic imaging of breast: Secondary | ICD-10-CM | POA: Diagnosis not present

## 2016-05-17 DIAGNOSIS — J029 Acute pharyngitis, unspecified: Secondary | ICD-10-CM | POA: Diagnosis not present

## 2016-05-17 DIAGNOSIS — E785 Hyperlipidemia, unspecified: Secondary | ICD-10-CM | POA: Diagnosis not present

## 2016-05-17 DIAGNOSIS — I10 Essential (primary) hypertension: Secondary | ICD-10-CM | POA: Diagnosis not present

## 2016-05-17 DIAGNOSIS — R0602 Shortness of breath: Secondary | ICD-10-CM | POA: Diagnosis not present

## 2016-05-17 DIAGNOSIS — N6310 Unspecified lump in the right breast, unspecified quadrant: Secondary | ICD-10-CM | POA: Diagnosis not present

## 2016-05-17 DIAGNOSIS — N644 Mastodynia: Secondary | ICD-10-CM | POA: Diagnosis not present

## 2016-05-17 DIAGNOSIS — R002 Palpitations: Secondary | ICD-10-CM | POA: Diagnosis not present

## 2016-05-17 DIAGNOSIS — R55 Syncope and collapse: Secondary | ICD-10-CM | POA: Diagnosis not present

## 2016-05-17 DIAGNOSIS — R079 Chest pain, unspecified: Secondary | ICD-10-CM | POA: Diagnosis not present

## 2016-05-19 DIAGNOSIS — J45909 Unspecified asthma, uncomplicated: Secondary | ICD-10-CM | POA: Diagnosis not present

## 2016-05-19 DIAGNOSIS — I10 Essential (primary) hypertension: Secondary | ICD-10-CM | POA: Diagnosis not present

## 2016-05-19 DIAGNOSIS — I472 Ventricular tachycardia: Secondary | ICD-10-CM | POA: Diagnosis not present

## 2016-05-19 DIAGNOSIS — E269 Hyperaldosteronism, unspecified: Secondary | ICD-10-CM | POA: Diagnosis not present

## 2016-05-23 ENCOUNTER — Emergency Department (HOSPITAL_BASED_OUTPATIENT_CLINIC_OR_DEPARTMENT_OTHER)
Admission: EM | Admit: 2016-05-23 | Discharge: 2016-05-23 | Disposition: A | Discharge status: Home / Self Care | Payer: Medicare Other | Attending: Emergency Medicine | Admitting: Emergency Medicine

## 2016-05-23 ENCOUNTER — Encounter (HOSPITAL_BASED_OUTPATIENT_CLINIC_OR_DEPARTMENT_OTHER): Payer: Self-pay | Admitting: Emergency Medicine

## 2016-05-23 DIAGNOSIS — Y999 Unspecified external cause status: Secondary | ICD-10-CM | POA: Diagnosis not present

## 2016-05-23 DIAGNOSIS — I10 Essential (primary) hypertension: Secondary | ICD-10-CM | POA: Insufficient documentation

## 2016-05-23 DIAGNOSIS — K219 Gastro-esophageal reflux disease without esophagitis: Secondary | ICD-10-CM | POA: Diagnosis not present

## 2016-05-23 DIAGNOSIS — G35 Multiple sclerosis: Secondary | ICD-10-CM | POA: Diagnosis not present

## 2016-05-23 DIAGNOSIS — J029 Acute pharyngitis, unspecified: Secondary | ICD-10-CM | POA: Diagnosis not present

## 2016-05-23 DIAGNOSIS — Y929 Unspecified place or not applicable: Secondary | ICD-10-CM | POA: Diagnosis not present

## 2016-05-23 DIAGNOSIS — R569 Unspecified convulsions: Secondary | ICD-10-CM | POA: Diagnosis not present

## 2016-05-23 DIAGNOSIS — J019 Acute sinusitis, unspecified: Secondary | ICD-10-CM | POA: Diagnosis not present

## 2016-05-23 DIAGNOSIS — Z87891 Personal history of nicotine dependence: Secondary | ICD-10-CM | POA: Diagnosis not present

## 2016-05-23 DIAGNOSIS — Z79899 Other long term (current) drug therapy: Secondary | ICD-10-CM | POA: Insufficient documentation

## 2016-05-23 DIAGNOSIS — E785 Hyperlipidemia, unspecified: Secondary | ICD-10-CM | POA: Diagnosis not present

## 2016-05-23 DIAGNOSIS — J45909 Unspecified asthma, uncomplicated: Secondary | ICD-10-CM | POA: Diagnosis not present

## 2016-05-23 DIAGNOSIS — J069 Acute upper respiratory infection, unspecified: Secondary | ICD-10-CM | POA: Diagnosis not present

## 2016-05-23 DIAGNOSIS — S90111A Contusion of right great toe without damage to nail, initial encounter: Secondary | ICD-10-CM | POA: Insufficient documentation

## 2016-05-23 DIAGNOSIS — E269 Hyperaldosteronism, unspecified: Secondary | ICD-10-CM | POA: Diagnosis not present

## 2016-05-23 DIAGNOSIS — Y9301 Activity, walking, marching and hiking: Secondary | ICD-10-CM | POA: Diagnosis not present

## 2016-05-23 DIAGNOSIS — R531 Weakness: Secondary | ICD-10-CM | POA: Diagnosis not present

## 2016-05-23 DIAGNOSIS — J00 Acute nasopharyngitis [common cold]: Secondary | ICD-10-CM | POA: Diagnosis not present

## 2016-05-23 DIAGNOSIS — M79674 Pain in right toe(s): Secondary | ICD-10-CM

## 2016-05-23 DIAGNOSIS — Z7951 Long term (current) use of inhaled steroids: Secondary | ICD-10-CM | POA: Diagnosis not present

## 2016-05-23 DIAGNOSIS — R7303 Prediabetes: Secondary | ICD-10-CM | POA: Diagnosis not present

## 2016-05-23 DIAGNOSIS — K2270 Barrett's esophagus without dysplasia: Secondary | ICD-10-CM | POA: Diagnosis not present

## 2016-05-23 DIAGNOSIS — W1839XA Other fall on same level, initial encounter: Secondary | ICD-10-CM | POA: Insufficient documentation

## 2016-05-23 DIAGNOSIS — R0602 Shortness of breath: Secondary | ICD-10-CM | POA: Diagnosis not present

## 2016-05-23 MED ORDER — DEXAMETHASONE 4 MG PO TABS
2.0000 mg | ORAL_TABLET | Freq: Once | ORAL | Status: AC
  Administered 2016-05-23: 2 mg via ORAL

## 2016-05-23 MED ORDER — DEXAMETHASONE 4 MG PO TABS
ORAL_TABLET | ORAL | Status: AC
  Filled 2016-05-23: qty 1
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
## 2016-05-23 NOTE — Discharge Instructions (Signed)
Follow up with your primary care provider for discussion of today's diagnosis.  Return to ER for new or worsening symptoms, any additional concerns.

## 2016-05-23 NOTE — ED Triage Notes (Addendum)
SOB, cough, sore throat x 2 days. Also concerned about possibly reinjuring her L big toe.

## 2016-05-24 ENCOUNTER — Ambulatory Visit (INDEPENDENT_AMBULATORY_CARE_PROVIDER_SITE_OTHER): Payer: Medicare Other

## 2016-05-24 ENCOUNTER — Encounter: Payer: Self-pay | Admitting: Family Medicine

## 2016-05-24 ENCOUNTER — Ambulatory Visit (INDEPENDENT_AMBULATORY_CARE_PROVIDER_SITE_OTHER): Payer: Medicare Other | Admitting: Family Medicine

## 2016-05-24 VITALS — BP 150/70 | HR 103 | Wt 203.0 lb

## 2016-05-24 DIAGNOSIS — M7989 Other specified soft tissue disorders: Secondary | ICD-10-CM | POA: Diagnosis not present

## 2016-05-24 DIAGNOSIS — J019 Acute sinusitis, unspecified: Secondary | ICD-10-CM | POA: Diagnosis not present

## 2016-05-24 DIAGNOSIS — M25474 Effusion, right foot: Secondary | ICD-10-CM | POA: Diagnosis not present

## 2016-05-24 DIAGNOSIS — I10 Essential (primary) hypertension: Secondary | ICD-10-CM

## 2016-05-24 DIAGNOSIS — M79674 Pain in right toe(s): Secondary | ICD-10-CM | POA: Diagnosis not present

## 2016-05-24 DIAGNOSIS — S90111A Contusion of right great toe without damage to nail, initial encounter: Secondary | ICD-10-CM | POA: Diagnosis not present

## 2016-05-24 NOTE — Progress Notes (Signed)
Subjective:    CC:   HPI:  Pt was seen in the ED yesterday for sore throat.  Nasal congestion and productive cough. She was started on doxycycline. She did take her first pill yesterday. They also gave her 1 pill of prednisone in the ED. She had declined an injection.  HTN - Saw Cardiology yesterday.  She felt the clonidine was pushing her BP up.  she decided to restart the labetolol.    She's also complaining of right great toe pain. She actually fractured the distal phalanx back in May when she had her great toe are running through the house. It was nondisplaced fracture. She hit it again this weekend and is worried that she may have refractured it.  Past medical history, Surgical history, Family history not pertinant except as noted below, Social history, Allergies, and medications have been entered into the medical record, reviewed, and corrections made.   Review of Systems: No fevers, chills, night sweats, weight loss, chest pain, or shortness of breath.   Objective:    General: Well Developed, well nourished, and in no acute distress.  Neuro: Alert and oriented x3, extra-ocular muscles intact, sensation grossly intact.  HEENT: Normocephalic, atraumatic , Oropharynx is clear. Tympanic membranes and canals are clear bilaterally. Nares are erythematous and swollen but not completely occluding her nasal passages. No significant cervical lymphadenopathy. Skin: Warm and dry, no rashes. Cardiac: Regular rate and rhythm, no murmurs rubs or gallops, no lower extremity edema.  Respiratory: Clear to auscultation bilaterally. Not using accessory muscles, speaking in full sentences. MSK: Right great toe is significantly swollen with some bruising at the distal and proximal joints. Painful with flexion and extension.   Impression and Recommendations:   Acute rhinosinusitis-just started doxycycline yesterday. Encouraged hydration and she hasn't been drinking a lot of fluid over the next couple  days especially while taking the antibiotic.  Hypertension-she's decided to restart the labetalol. She actually did really well on this for several months. She did feel like it was causing some fatigue and chest heaviness been out of all the medication she's tried it really think this is probably going to be the best one for her.  Right great toe pain-x-ray did not show fracture. Will place in postop shoe for support for the next 2 weeks. She should be able to gradually put pressure on it.

## 2016-05-24 NOTE — Patient Instructions (Addendum)
Wear postop shoe during the day for the next 2 weeks. After that can start to gradually quit using it.

## 2016-05-29 ENCOUNTER — Telehealth: Payer: Self-pay

## 2016-05-29 NOTE — Telephone Encounter (Signed)
Patient's call was returned , no answer , left a detailed message with request for the patient to schedule a follow up appointment with Dr Janie Morning to discuss "premarin". Melissa Cross, APNP aware.

## 2016-05-30 ENCOUNTER — Telehealth: Payer: Self-pay | Admitting: Gynecologic Oncology

## 2016-05-30 NOTE — Telephone Encounter (Signed)
Patient called requesting premarin vaginal cream.  She reports "vaginal pain with intercourse and dryness."  She states she had tried the oral estrogen but since she is having cardiac issues, she had stopped and wanted to try to vaginal estrogen.  Reporting mood swings and not sure if she is having hot flashes.  Advised her that the vaginal estrogen is not significantly absorbed and would probably not assist with her symptoms of mood swings and questionable hot flashes.  Advised that she needs to be seeing a GYN every six months.  She was last seen by [place] in March.  Patient stating she was thinking she only needed exams once a year.  Advised that per Dr. Elenora Gamma note she was supposed to follow up in six months.  Patient stating she will try to get in with her GYN in [facility] because she has issues with her car and she is concerned about the longer drive to come here.  She states she will try to see Dr. Joya Gaskins then see [place] in March.  Advised that it would be best for her to have someone perform an exam before prescribing vaginal estrogen.  All questions answered.  Advised to call us back with an update once she had scheduled an appt with Dr. Joya Gaskins.

## 2016-06-01 DIAGNOSIS — E876 Hypokalemia: Secondary | ICD-10-CM | POA: Diagnosis not present

## 2016-06-07 ENCOUNTER — Encounter: Payer: Self-pay | Admitting: Family Medicine

## 2016-06-07 ENCOUNTER — Ambulatory Visit (INDEPENDENT_AMBULATORY_CARE_PROVIDER_SITE_OTHER): Payer: Medicare Other | Admitting: Family Medicine

## 2016-06-07 VITALS — BP 139/77 | HR 99 | Wt 203.0 lb

## 2016-06-07 DIAGNOSIS — H6122 Impacted cerumen, left ear: Secondary | ICD-10-CM

## 2016-06-07 DIAGNOSIS — T23262A Burn of second degree of back of left hand, initial encounter: Secondary | ICD-10-CM | POA: Diagnosis not present

## 2016-06-07 DIAGNOSIS — M549 Dorsalgia, unspecified: Secondary | ICD-10-CM

## 2016-06-07 DIAGNOSIS — I10 Essential (primary) hypertension: Secondary | ICD-10-CM | POA: Diagnosis not present

## 2016-06-07 IMAGING — DX DG CHEST 2V
2 series · 2 of 2 positions shown · non-contrast
Comparison: 02/12/2015

CLINICAL DATA: Shortness of breath. Productive cough. Symptoms for
1 day.

EXAM:
CHEST  2 VIEW

[chest pa]
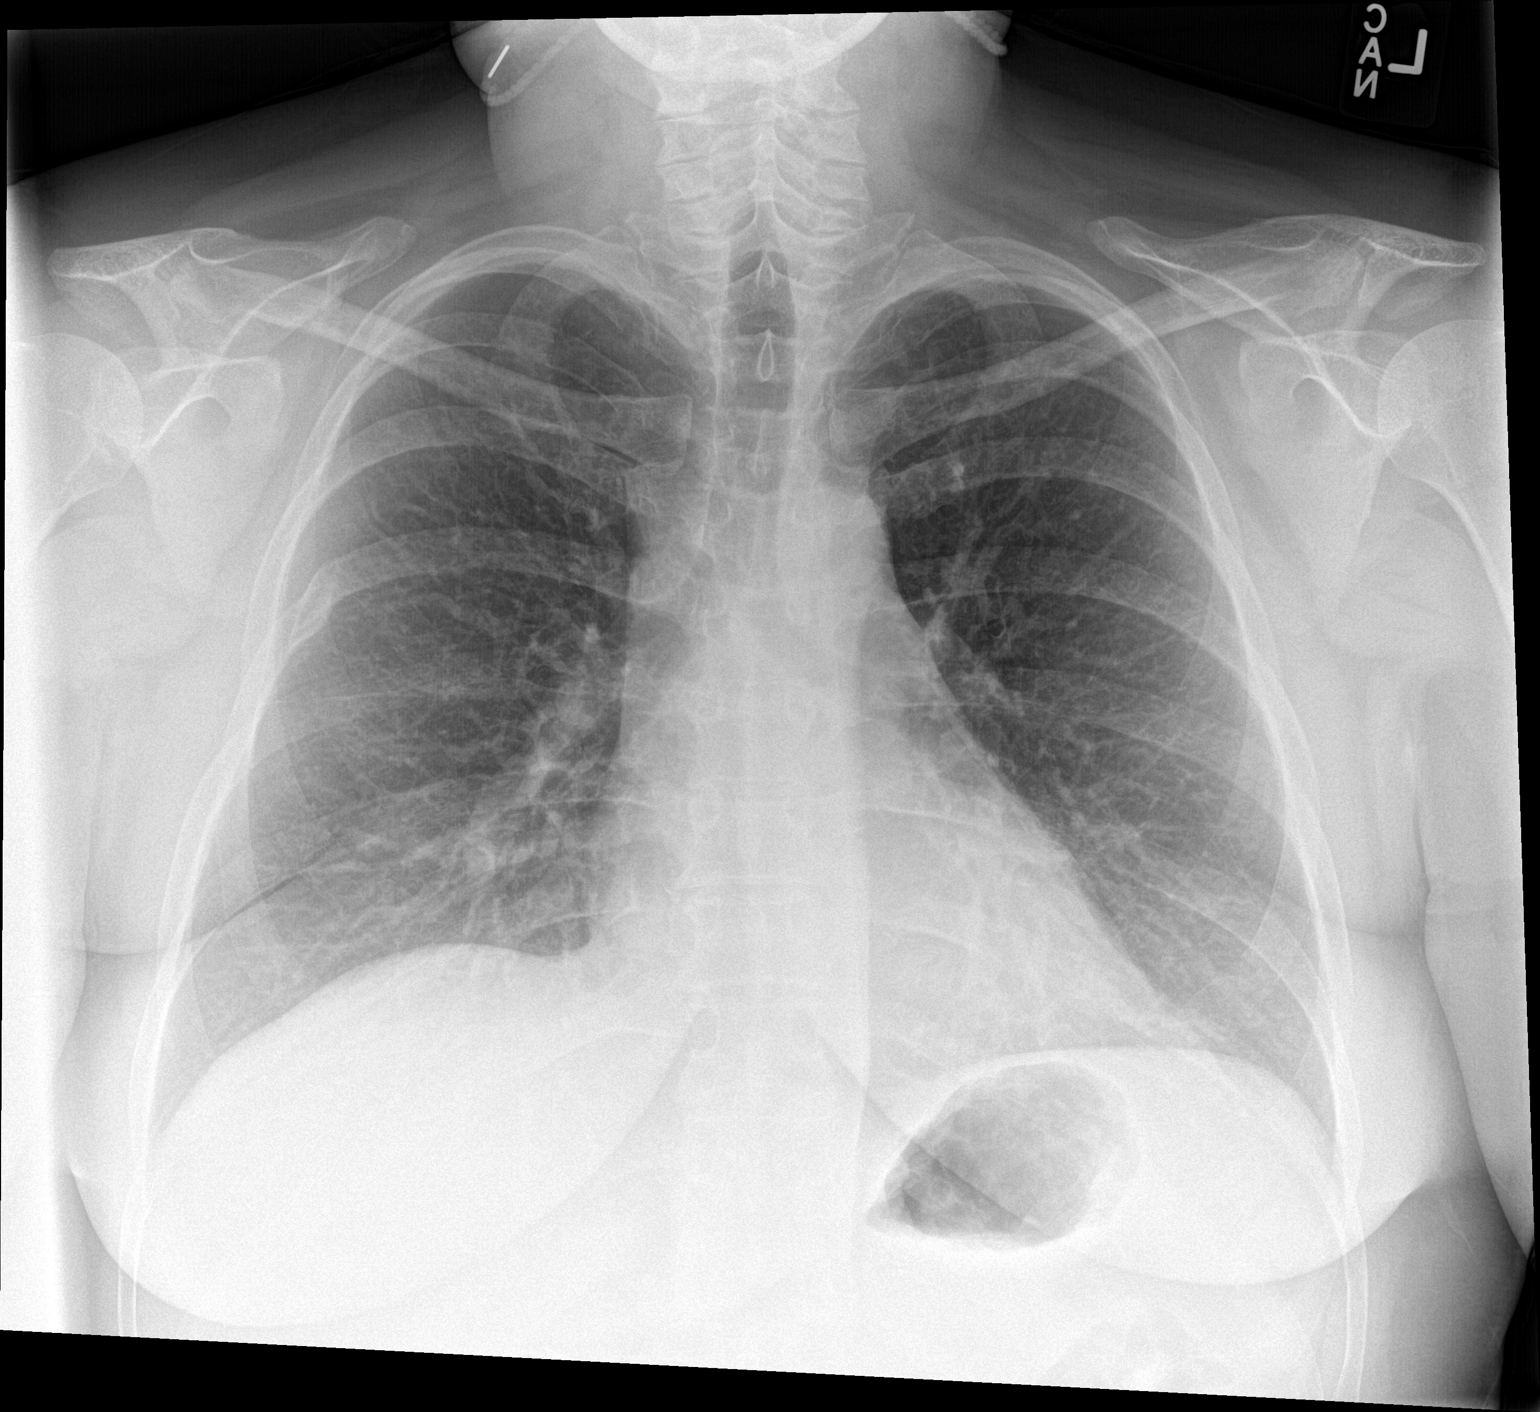

[chest lat]
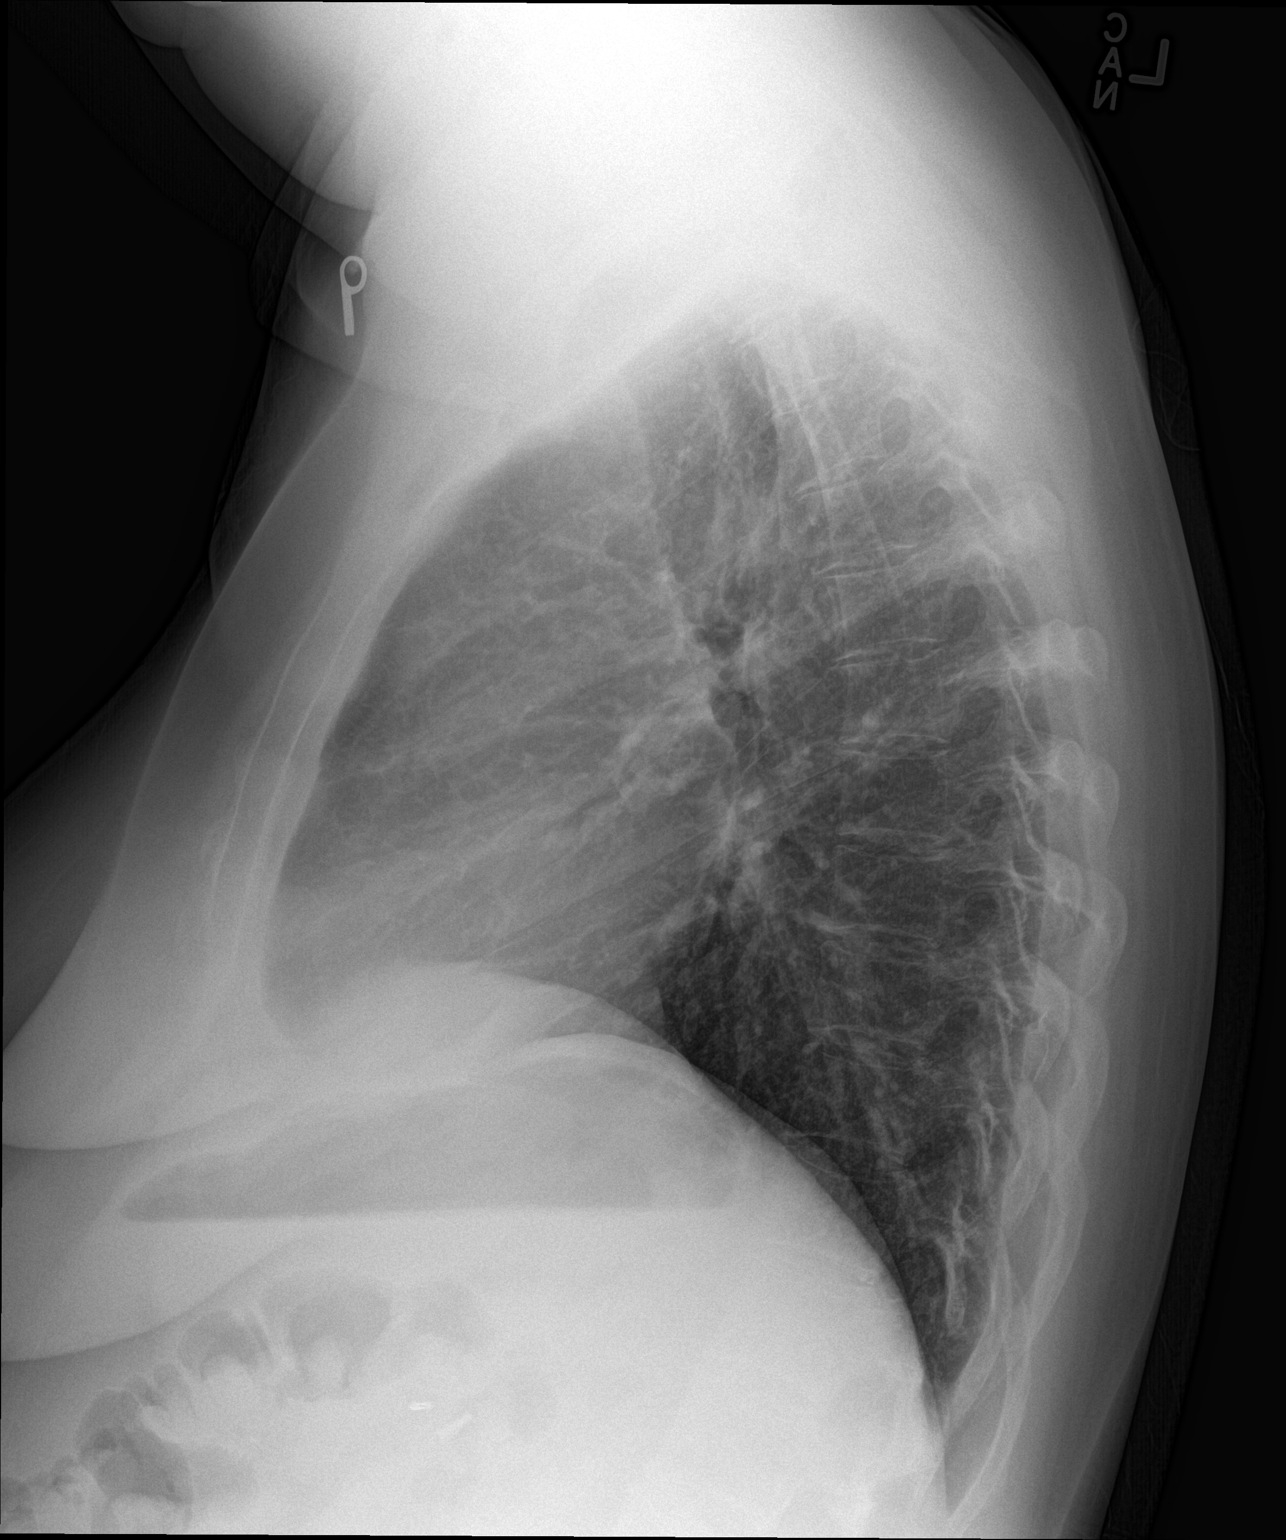

[2 of 2 positions shown; findings below may reference images not displayed]

FINDINGS: The cardiomediastinal contours are normal. The lungs are clear.
Pulmonary vasculature is normal. No consolidation, pleural effusion,
or pneumothorax. Remote right rib fracture again seen. No acute
osseous abnormalities are seen.
IMPRESSION: No acute pulmonary process.
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
## 2016-06-07 NOTE — Patient Instructions (Signed)
Work on stretches and physical therapy exercise. Apply heat for 10-15 minutes twice a day. Once before stretches and then again before bedtime. Can use ice inbetween.

## 2016-06-08 DIAGNOSIS — Z87891 Personal history of nicotine dependence: Secondary | ICD-10-CM | POA: Diagnosis not present

## 2016-06-08 DIAGNOSIS — M549 Dorsalgia, unspecified: Secondary | ICD-10-CM | POA: Diagnosis not present

## 2016-06-08 DIAGNOSIS — Z711 Person with feared health complaint in whom no diagnosis is made: Secondary | ICD-10-CM | POA: Diagnosis not present

## 2016-06-08 DIAGNOSIS — Z885 Allergy status to narcotic agent status: Secondary | ICD-10-CM | POA: Diagnosis not present

## 2016-06-08 DIAGNOSIS — E785 Hyperlipidemia, unspecified: Secondary | ICD-10-CM | POA: Diagnosis not present

## 2016-06-08 DIAGNOSIS — Z888 Allergy status to other drugs, medicaments and biological substances status: Secondary | ICD-10-CM | POA: Diagnosis not present

## 2016-06-08 DIAGNOSIS — R0602 Shortness of breath: Secondary | ICD-10-CM | POA: Diagnosis not present

## 2016-06-08 DIAGNOSIS — Z886 Allergy status to analgesic agent status: Secondary | ICD-10-CM | POA: Diagnosis not present

## 2016-06-08 DIAGNOSIS — G8929 Other chronic pain: Secondary | ICD-10-CM | POA: Diagnosis not present

## 2016-06-08 DIAGNOSIS — J45909 Unspecified asthma, uncomplicated: Secondary | ICD-10-CM | POA: Diagnosis not present

## 2016-06-08 DIAGNOSIS — I10 Essential (primary) hypertension: Secondary | ICD-10-CM | POA: Diagnosis not present

## 2016-06-09 ENCOUNTER — Emergency Department (HOSPITAL_BASED_OUTPATIENT_CLINIC_OR_DEPARTMENT_OTHER)
Admission: EM | Admit: 2016-06-09 | Discharge: 2016-06-09 | Disposition: A | Discharge status: Home / Self Care | Payer: Medicare Other | Attending: Emergency Medicine | Admitting: Emergency Medicine

## 2016-06-09 ENCOUNTER — Encounter (HOSPITAL_BASED_OUTPATIENT_CLINIC_OR_DEPARTMENT_OTHER): Payer: Self-pay | Admitting: *Deleted

## 2016-06-09 DIAGNOSIS — Z7951 Long term (current) use of inhaled steroids: Secondary | ICD-10-CM | POA: Insufficient documentation

## 2016-06-09 DIAGNOSIS — Z79891 Long term (current) use of opiate analgesic: Secondary | ICD-10-CM | POA: Insufficient documentation

## 2016-06-09 DIAGNOSIS — J45909 Unspecified asthma, uncomplicated: Secondary | ICD-10-CM | POA: Insufficient documentation

## 2016-06-09 DIAGNOSIS — I10 Essential (primary) hypertension: Secondary | ICD-10-CM | POA: Diagnosis not present

## 2016-06-09 DIAGNOSIS — Z87891 Personal history of nicotine dependence: Secondary | ICD-10-CM | POA: Insufficient documentation

## 2016-06-09 DIAGNOSIS — J029 Acute pharyngitis, unspecified: Secondary | ICD-10-CM | POA: Diagnosis not present

## 2016-06-09 DIAGNOSIS — Z79899 Other long term (current) drug therapy: Secondary | ICD-10-CM | POA: Diagnosis not present

## 2016-06-09 LAB — RAPID STREP SCREEN (MED CTR MEBANE ONLY): Streptococcus, Group A Screen (Direct): NEGATIVE
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
## 2016-06-09 NOTE — ED Triage Notes (Signed)
C/o sorethroat with white patches in throat since yesterday. Also states she feels like her tongue and upper lip is swollen.  No obvious swelling noted.

## 2016-06-10 DIAGNOSIS — G35 Multiple sclerosis: Secondary | ICD-10-CM | POA: Diagnosis not present

## 2016-06-10 DIAGNOSIS — M79642 Pain in left hand: Secondary | ICD-10-CM | POA: Diagnosis not present

## 2016-06-10 DIAGNOSIS — Z885 Allergy status to narcotic agent status: Secondary | ICD-10-CM | POA: Diagnosis not present

## 2016-06-10 DIAGNOSIS — Z888 Allergy status to other drugs, medicaments and biological substances status: Secondary | ICD-10-CM | POA: Diagnosis not present

## 2016-06-10 DIAGNOSIS — I10 Essential (primary) hypertension: Secondary | ICD-10-CM | POA: Diagnosis not present

## 2016-06-10 DIAGNOSIS — Z8249 Family history of ischemic heart disease and other diseases of the circulatory system: Secondary | ICD-10-CM | POA: Diagnosis not present

## 2016-06-10 DIAGNOSIS — Z9289 Personal history of other medical treatment: Secondary | ICD-10-CM | POA: Diagnosis not present

## 2016-06-10 DIAGNOSIS — E785 Hyperlipidemia, unspecified: Secondary | ICD-10-CM | POA: Diagnosis not present

## 2016-06-10 DIAGNOSIS — M25531 Pain in right wrist: Secondary | ICD-10-CM | POA: Diagnosis not present

## 2016-06-10 DIAGNOSIS — Z87891 Personal history of nicotine dependence: Secondary | ICD-10-CM | POA: Diagnosis not present

## 2016-06-10 DIAGNOSIS — D649 Anemia, unspecified: Secondary | ICD-10-CM | POA: Diagnosis not present

## 2016-06-10 DIAGNOSIS — S6991XA Unspecified injury of right wrist, hand and finger(s), initial encounter: Secondary | ICD-10-CM | POA: Diagnosis not present

## 2016-06-10 DIAGNOSIS — M7989 Other specified soft tissue disorders: Secondary | ICD-10-CM | POA: Diagnosis not present

## 2016-06-10 DIAGNOSIS — M79645 Pain in left finger(s): Secondary | ICD-10-CM | POA: Diagnosis not present

## 2016-06-10 DIAGNOSIS — S6992XA Unspecified injury of left wrist, hand and finger(s), initial encounter: Secondary | ICD-10-CM | POA: Diagnosis not present

## 2016-06-10 DIAGNOSIS — Z886 Allergy status to analgesic agent status: Secondary | ICD-10-CM | POA: Diagnosis not present

## 2016-06-10 DIAGNOSIS — J45909 Unspecified asthma, uncomplicated: Secondary | ICD-10-CM | POA: Diagnosis not present

## 2016-06-11 IMAGING — DX DG CHEST 2V
2 series · 2 of 2 positions shown · non-contrast
Comparison: 03/15/2015

CLINICAL DATA: Increasing lethargy with back pain. Hx:asthma, MS,
uterine cancer

EXAM:
CHEST  2 VIEW

[chest pa]
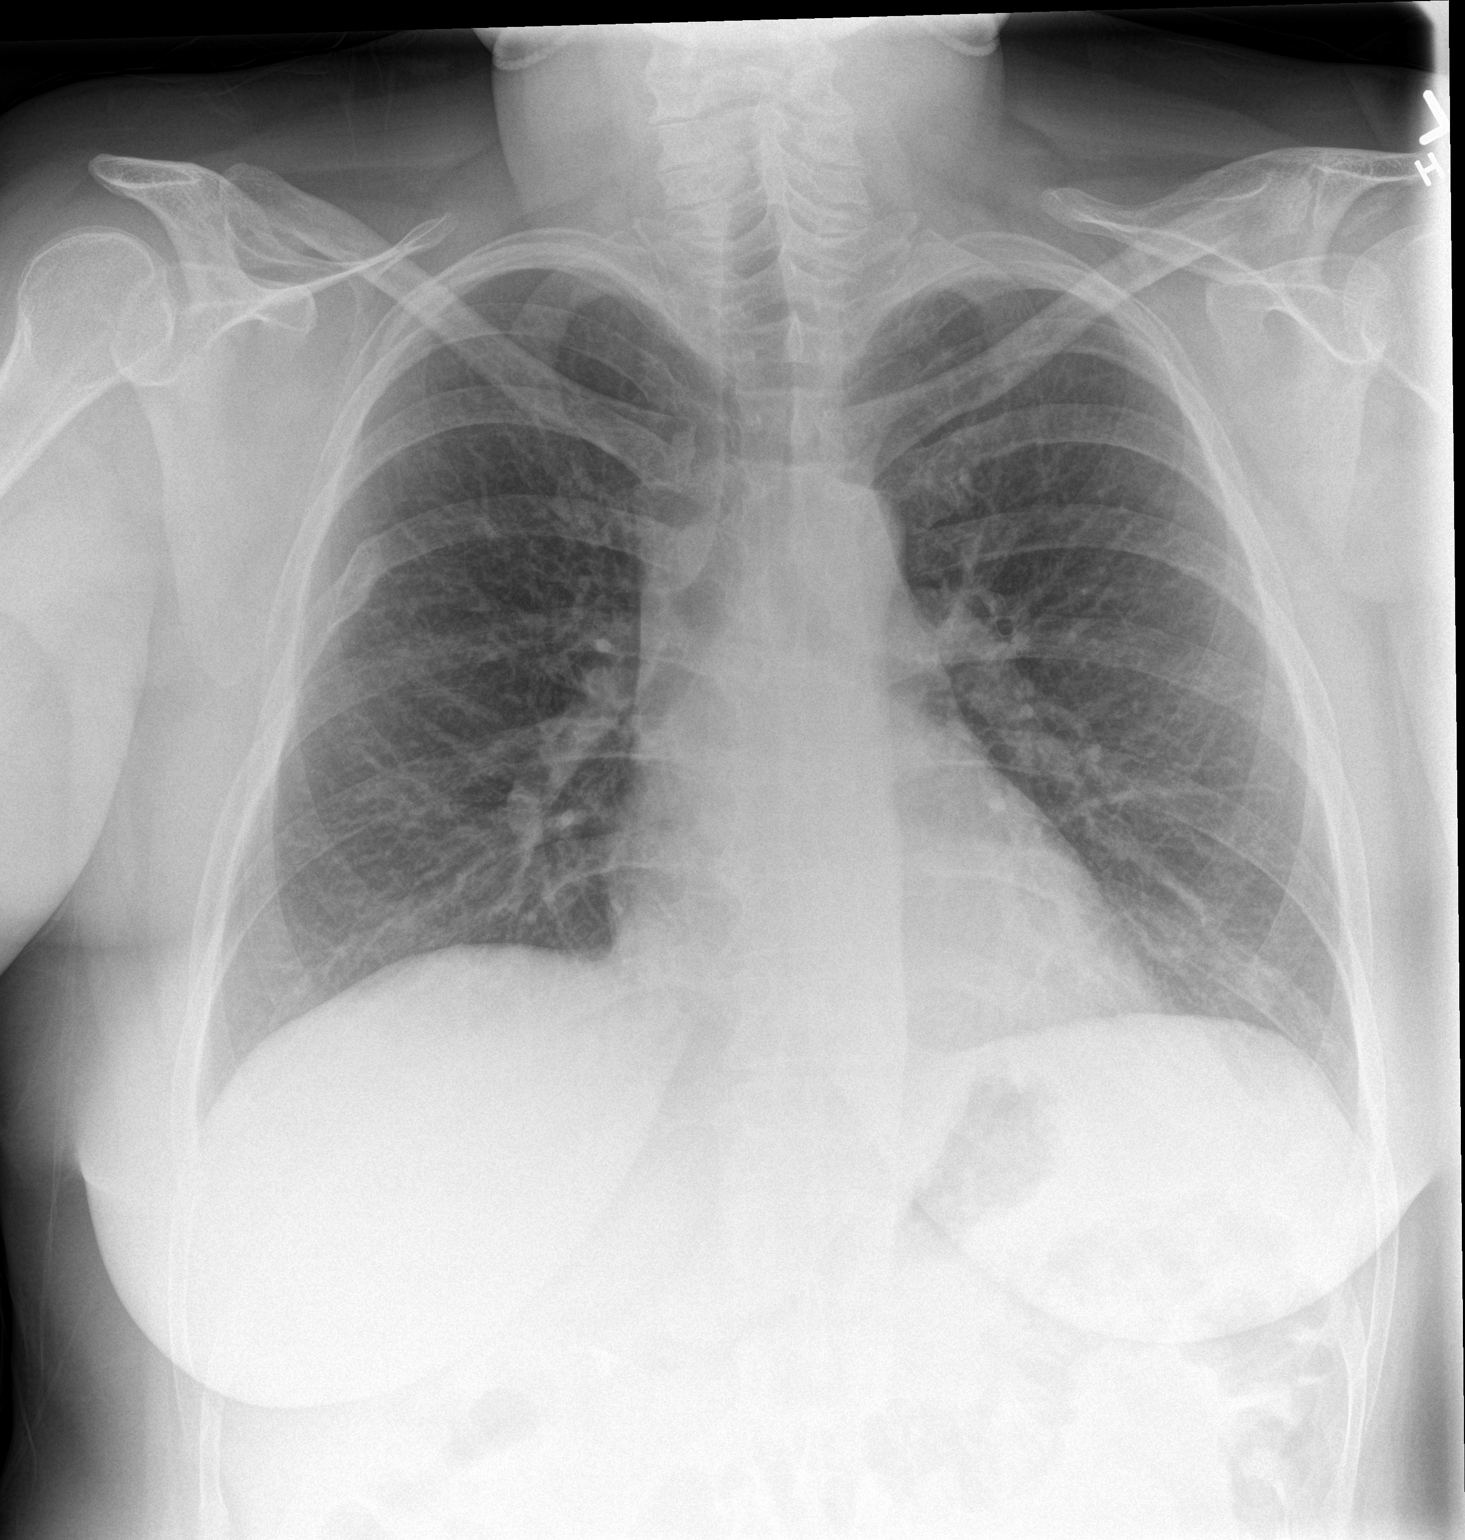

[chest lat]
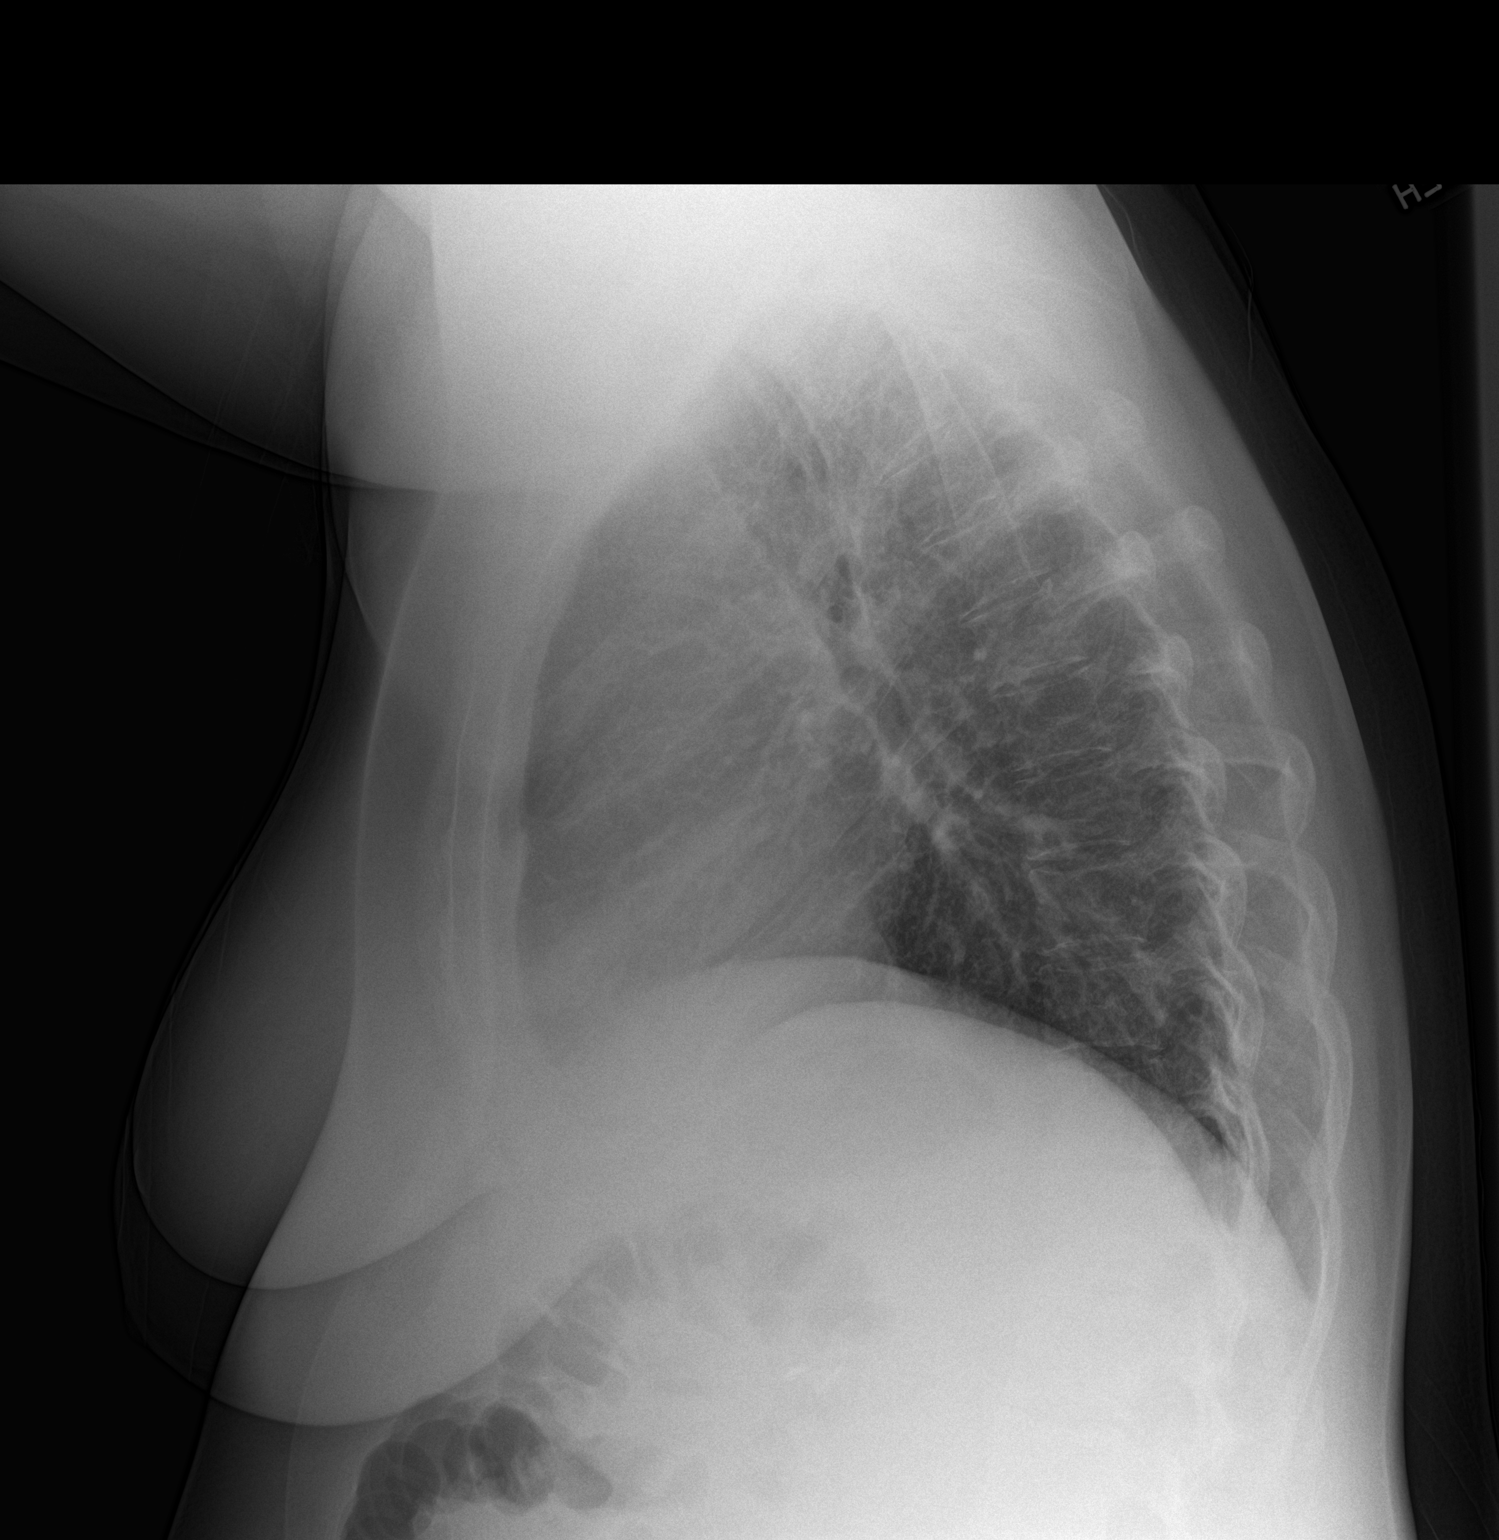

[2 of 2 positions shown; findings below may reference images not displayed]

FINDINGS: Cardiac silhouette normal in size and configuration. Normal
mediastinal and hilar contours.

Lungs are clear.  No pleural effusion or pneumothorax.

Old, healed right rib fracture.  Bony thorax otherwise unremarkable.
IMPRESSION: No active cardiopulmonary disease.

## 2016-06-11 IMAGING — MR MR LUMBAR SPINE WO/W CM
4 of 7 series · 19 of 48 positions shown · IV contrast (multihance)
Comparison: None.

CLINICAL DATA: Low back pain for 3 days, resulting in headache.

EXAM:
MRI LUMBAR SPINE WITHOUT AND WITH CONTRAST
TECHNIQUE: Multiplanar and multiecho pulse sequences of the lumbar spine were
obtained without and with intravenous contrast.
CONTRAST:  MultiHance 19 mL.

[Series 200: T2 · sagittal · 4.0mm · 0.55mm/px · 4 of 13 slices shown (1 of 2)]
[im 1/13]
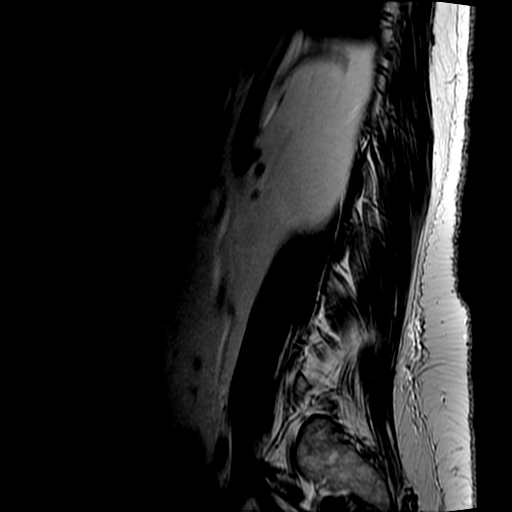
[im 5/13]
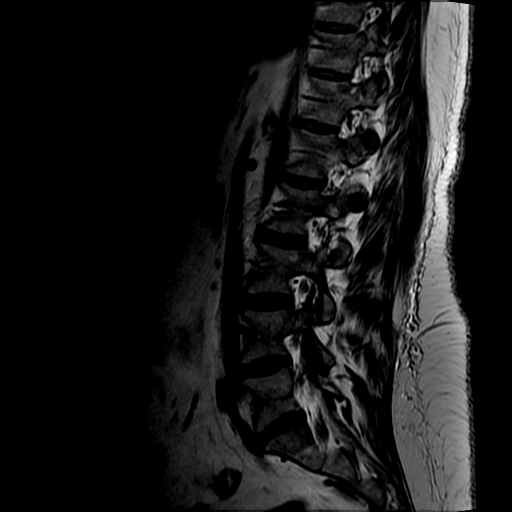
[im 9/13]
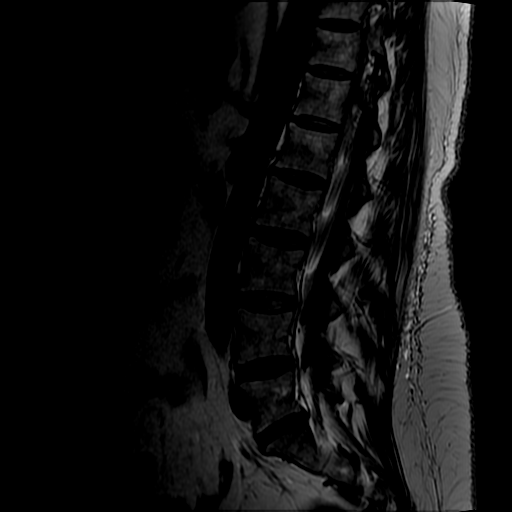
[im 13/13]
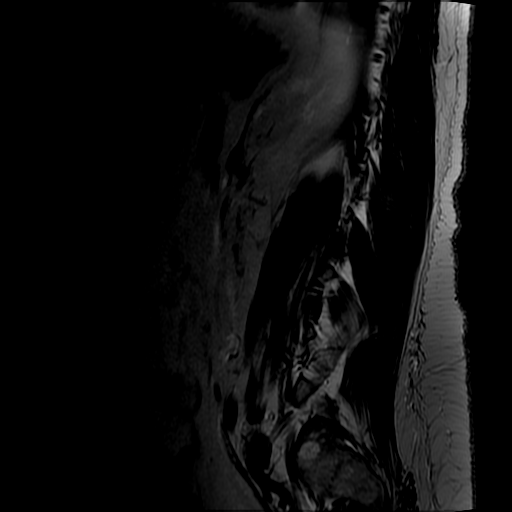

[Series 400: T1 · sagittal · 4.0mm · 0.55mm/px · 3 of 13 slices shown (1 of 2)]
[im 1/13]
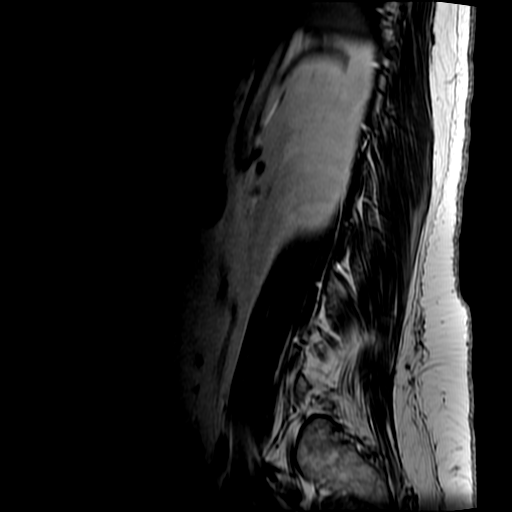
[im 9/13]
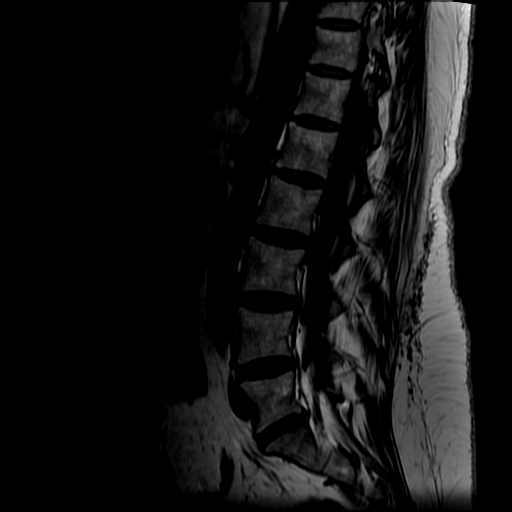
[im 13/13]
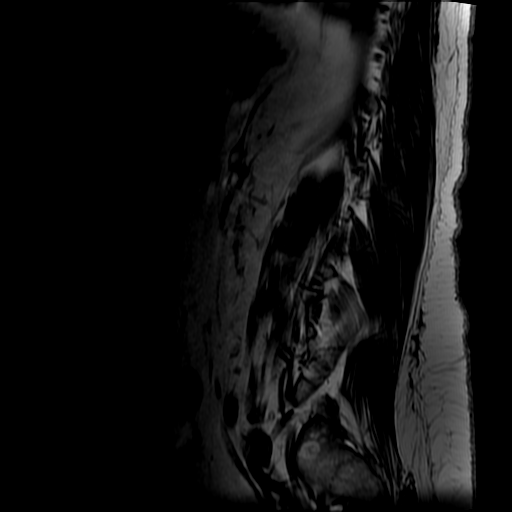

[Series 500: T2 · axial · 4.0mm · 0.39mm/px · z∈[-192,+20]mm · 9 of 44 slices shown (2 of 2)]
[im 1/44]
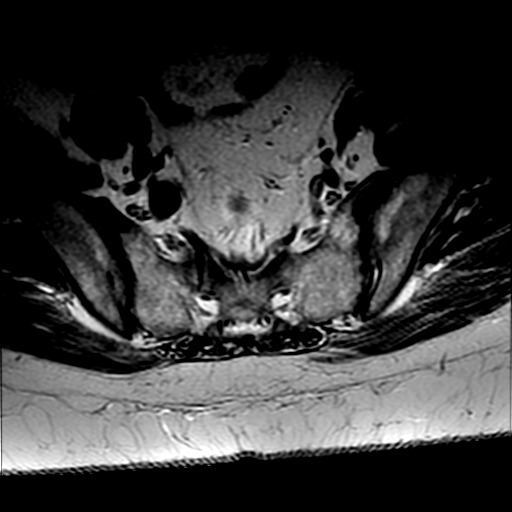
[im 5/44]
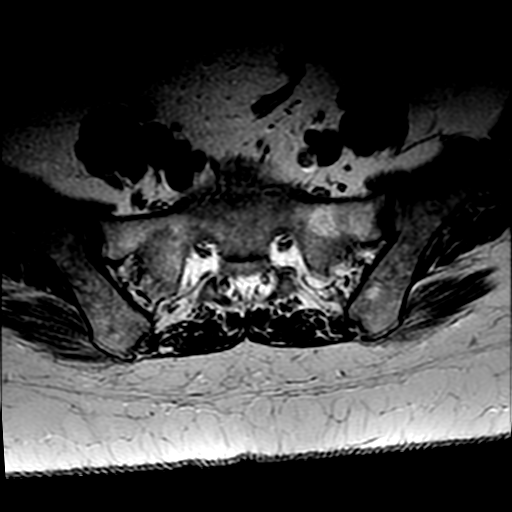
[im 9/44]
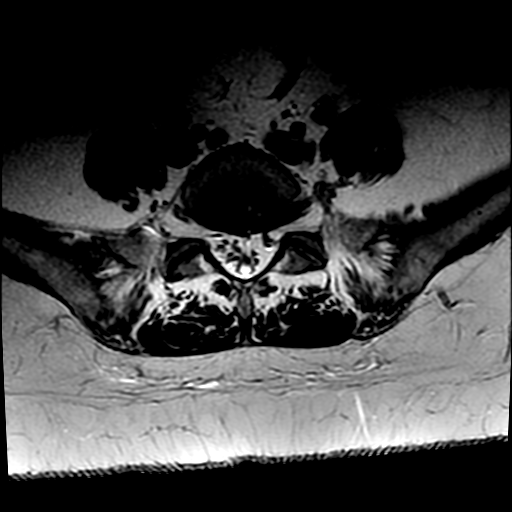
[im 13/44]
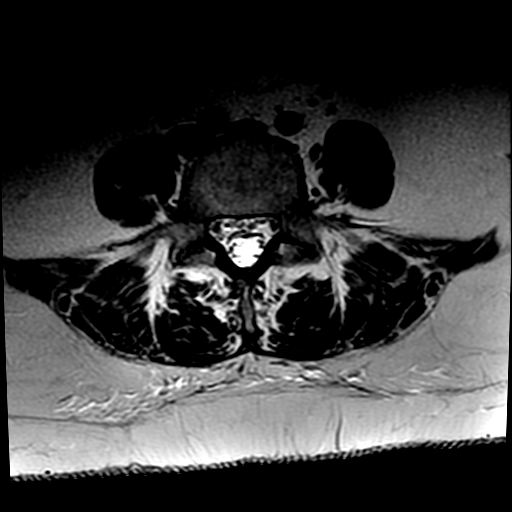
[im 18/44]
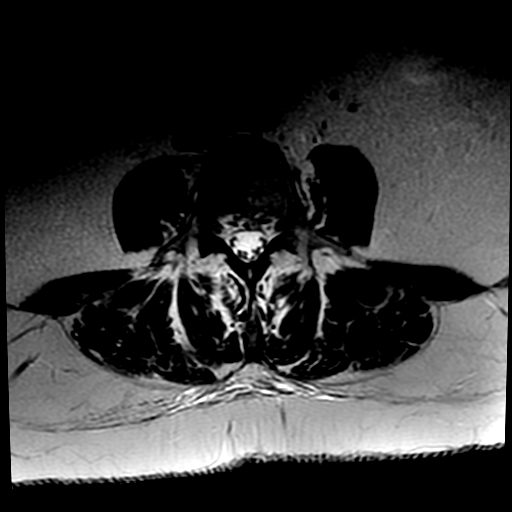
[im 22/44]
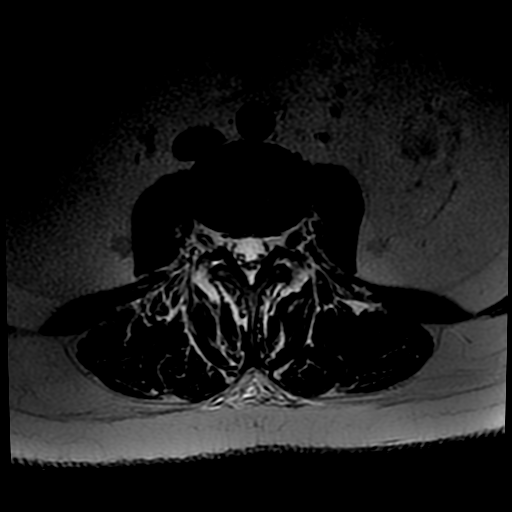
[im 26/44]
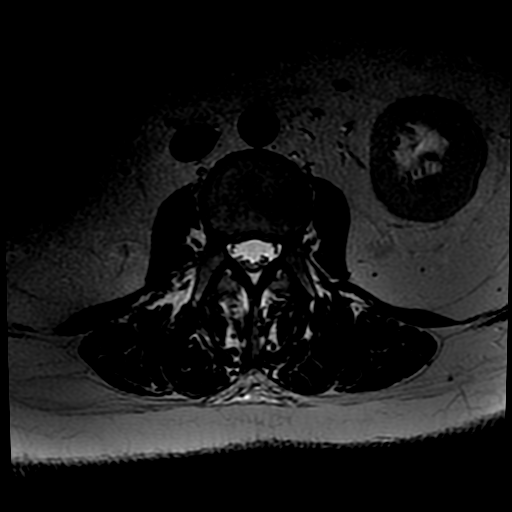
[im 31/44]
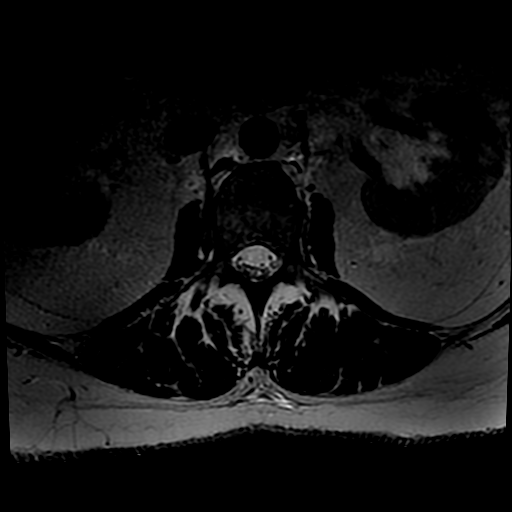
[im 39/44]
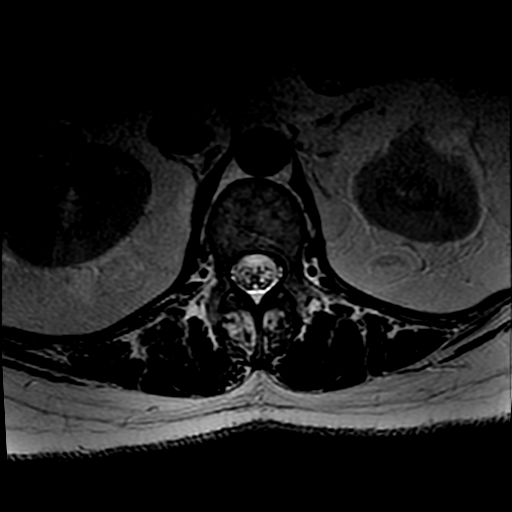

[Series 600: T1 · axial · 4.0mm · 0.39mm/px · z∈[-172,+20]mm · 3 of 44 slices shown (2 of 2)]
[im 5/44]
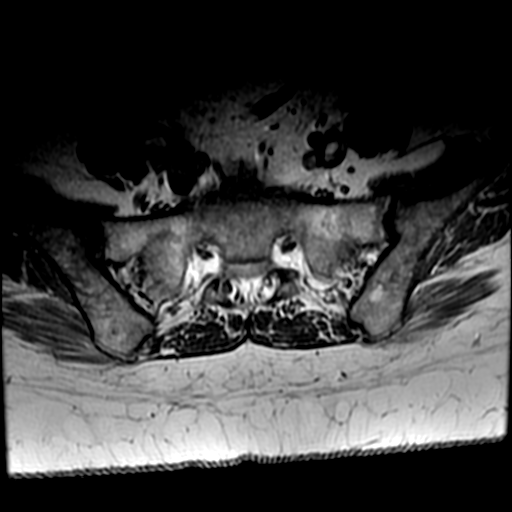
[im 22/44]
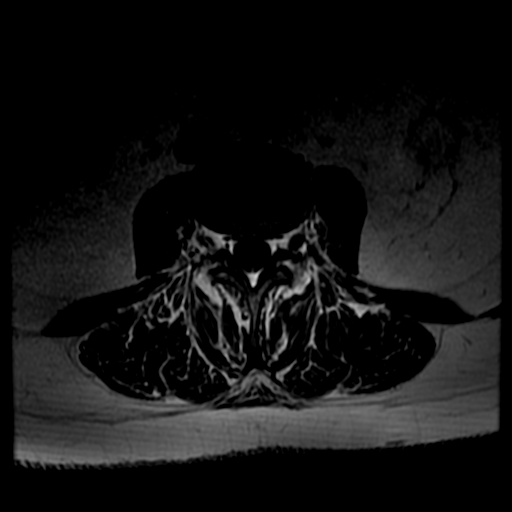
[im 39/44]
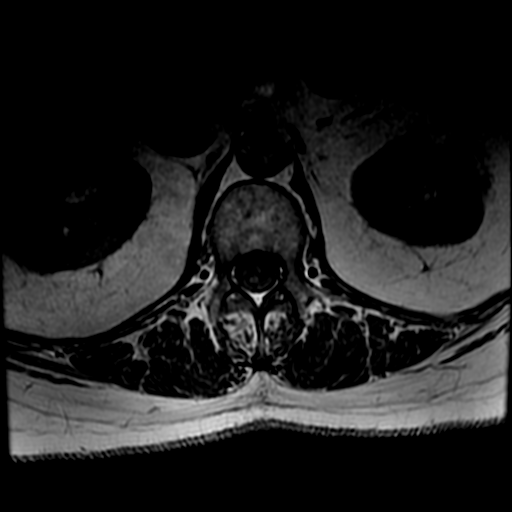

[19 of 48 positions shown; findings below may reference images not displayed]

FINDINGS: Segmentation: Normal.

Alignment:  Normal.

Vertebrae: No worrisome osseous lesion.

Conus medullaris: Normal in size, signal, and location.

Paraspinal tissues: No evidence for hydronephrosis or paravertebral
mass.

Disc levels:

L1-L2:  Normal.

L2-L3:  Normal.

L3-L4:  Normal.

L4-L5:  Normal.

L5-S1: Disc space narrowing. Chronic annular tear has become
vascularized without significant protrusion of disc material. No
subarticular zone or foraminal zone narrowing. Mild facet
arthropathy.
IMPRESSION: Chronic disc degeneration L5-S1. No frank disc protrusion or spinal
stenosis. No neural impingement or subluxation.

## 2016-06-12 LAB — CULTURE, GROUP A STREP (THRC)

## 2016-06-15 DIAGNOSIS — E269 Hyperaldosteronism, unspecified: Secondary | ICD-10-CM | POA: Diagnosis not present

## 2016-06-15 DIAGNOSIS — E876 Hypokalemia: Secondary | ICD-10-CM | POA: Diagnosis not present

## 2016-06-15 DIAGNOSIS — I10 Essential (primary) hypertension: Secondary | ICD-10-CM | POA: Diagnosis not present

## 2016-06-15 DIAGNOSIS — I471 Supraventricular tachycardia: Secondary | ICD-10-CM | POA: Diagnosis not present

## 2016-06-15 DIAGNOSIS — I472 Ventricular tachycardia: Secondary | ICD-10-CM | POA: Diagnosis not present

## 2016-06-15 LAB — BASIC METABOLIC PANEL
BUN: 12 mg/dL (ref 4–21)
Creatinine: 0.6 mg/dL (ref 0.5–1.1)
Glucose: 125 mg/dL
Potassium: 3.9 mmol/L (ref 3.4–5.3)
Sodium: 144 mmol/L (ref 137–147)

## 2016-06-16 DIAGNOSIS — H524 Presbyopia: Secondary | ICD-10-CM | POA: Diagnosis not present

## 2016-06-16 DIAGNOSIS — H5212 Myopia, left eye: Secondary | ICD-10-CM | POA: Diagnosis not present

## 2016-06-16 DIAGNOSIS — H52223 Regular astigmatism, bilateral: Secondary | ICD-10-CM | POA: Diagnosis not present

## 2016-06-16 DIAGNOSIS — H16223 Keratoconjunctivitis sicca, not specified as Sjogren's, bilateral: Secondary | ICD-10-CM | POA: Diagnosis not present

## 2016-06-19 IMAGING — US US EXTREM LOW VENOUS*R*
1 series · 13 of 24 positions shown · non-contrast
Comparison: August 25, 2013

CLINICAL DATA: Pain and swelling right knee region

EXAM:
RIGHT LOWER EXTREMITY VENOUS DUPLEX ULTRASOUND
TECHNIQUE: Gray-scale sonography with graded compression, as well as color
Doppler and duplex ultrasound were performed to evaluate the right
lower extremity deep venous system from the level of the common
femoral vein and including the common femoral, femoral, profunda
femoral, popliteal and calf veins including the posterior tibial,
peroneal and gastrocnemius veins when visible. The superficial great
saphenous vein was also interrogated. Spectral Doppler was utilized
to evaluate flow at rest and with distal augmentation maneuvers in
the common femoral, femoral and popliteal veins.

[Series 1: us extrem low venous*right* · 0.08mm/px · 13 of 45 slices shown]
[im 1/45]
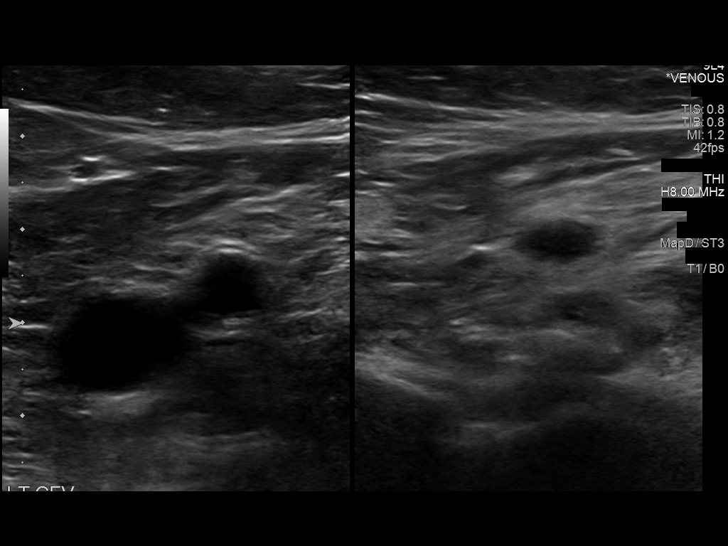
[im 4/45]
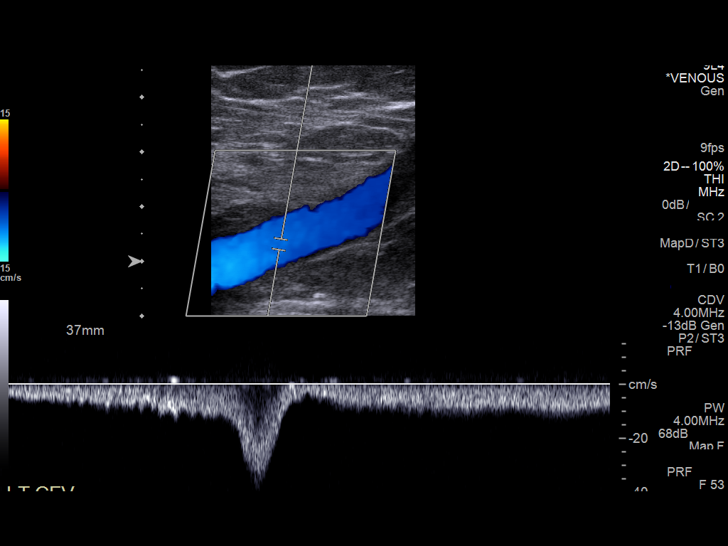
[im 8/45]
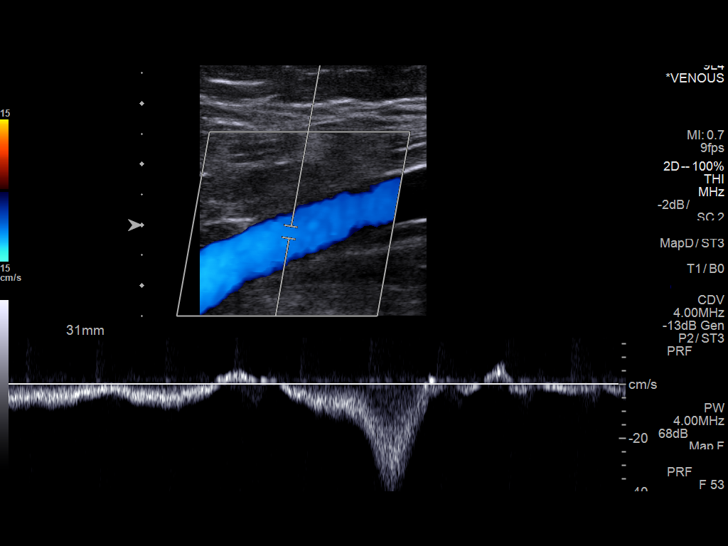
[im 12/45]
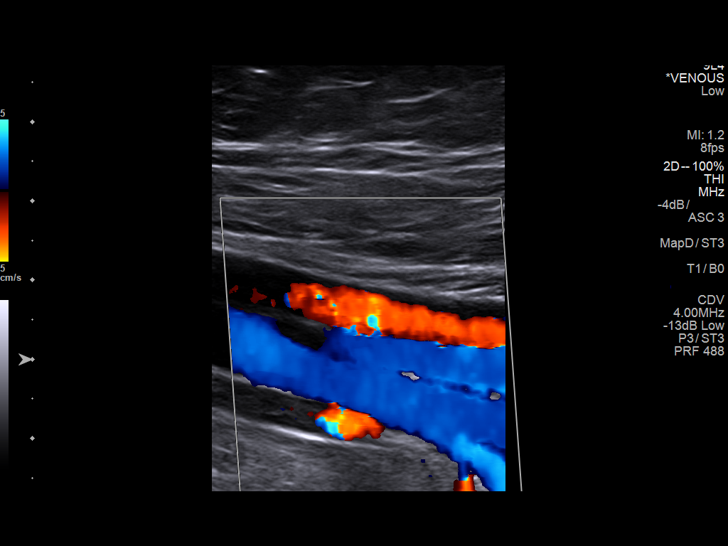
[im 16/45]
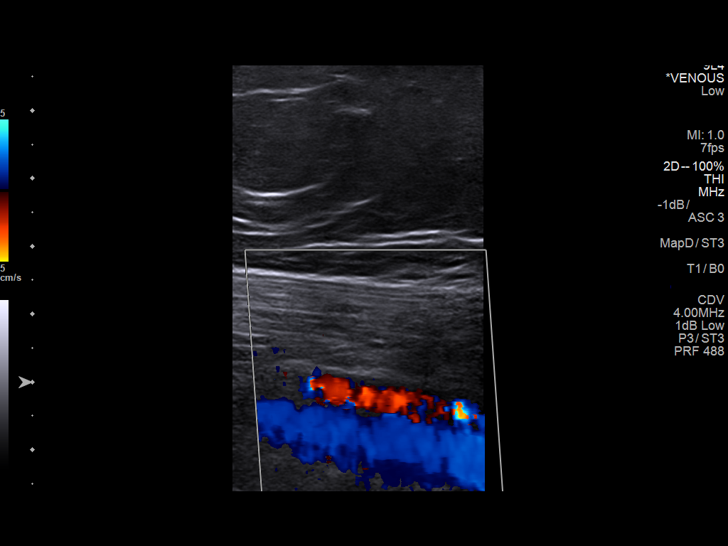
[im 20/45]
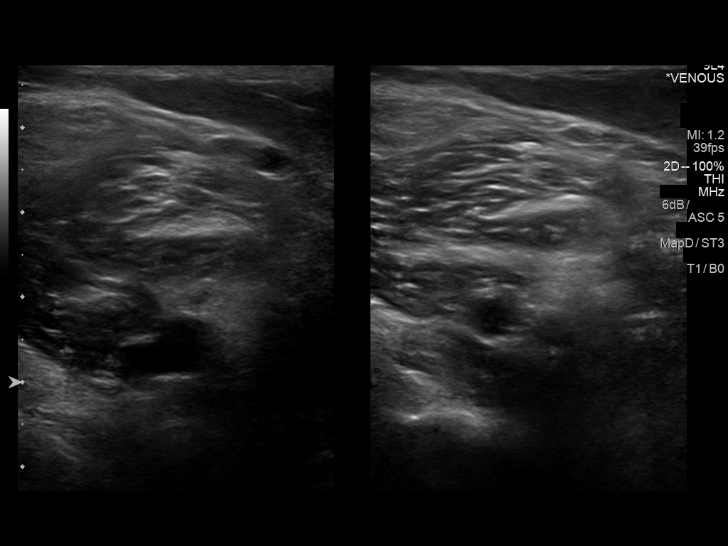
[im 23/45]
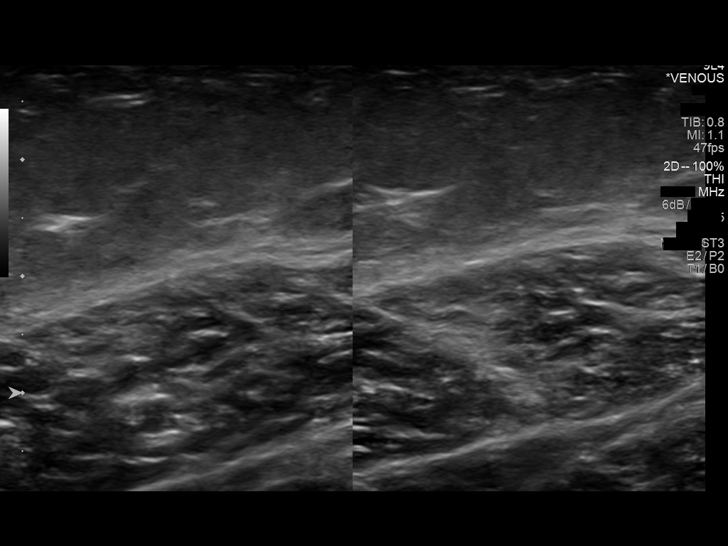
[im 25/45]
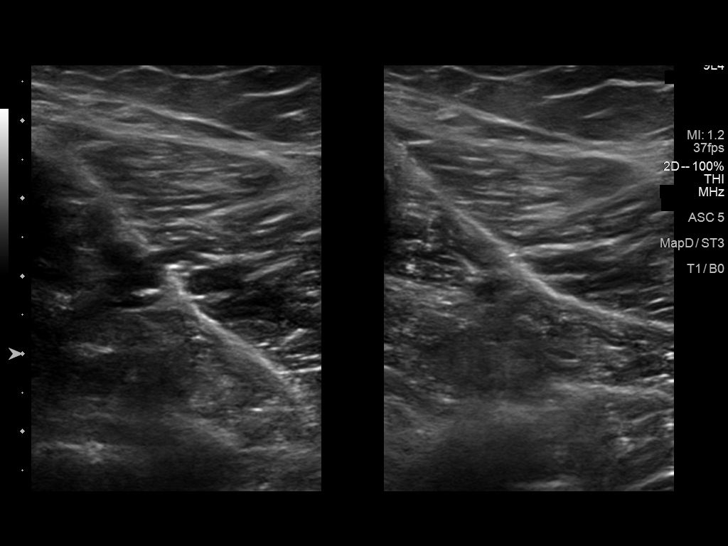
[im 29/45]
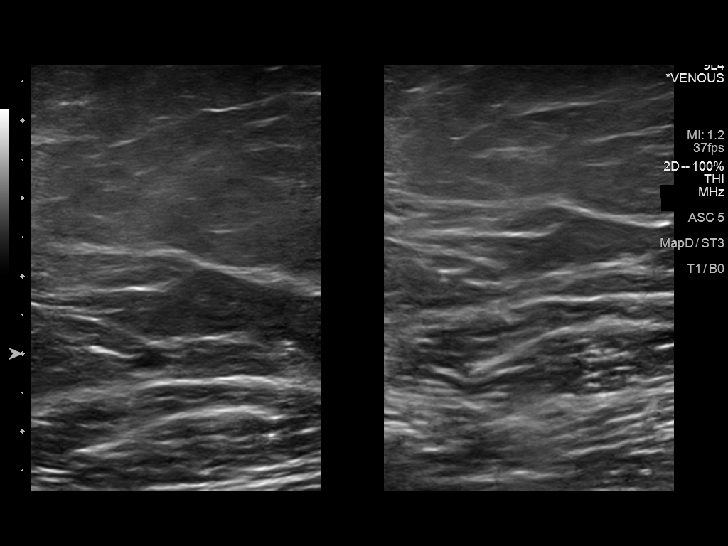
[im 33/45]
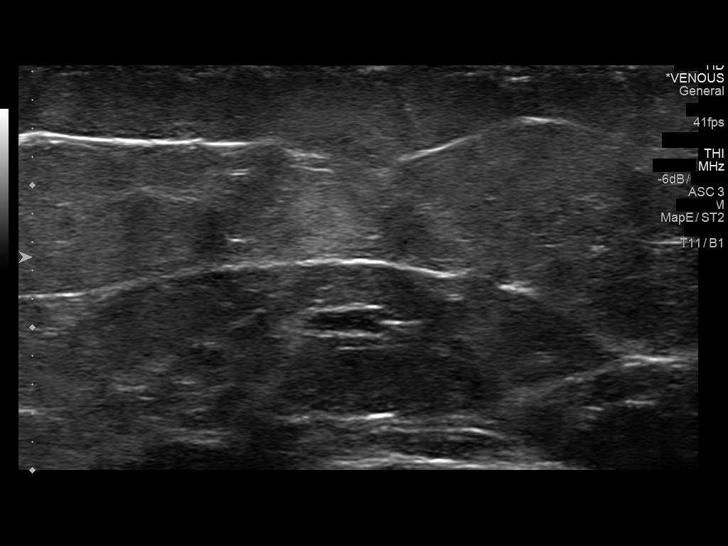
[im 37/45]
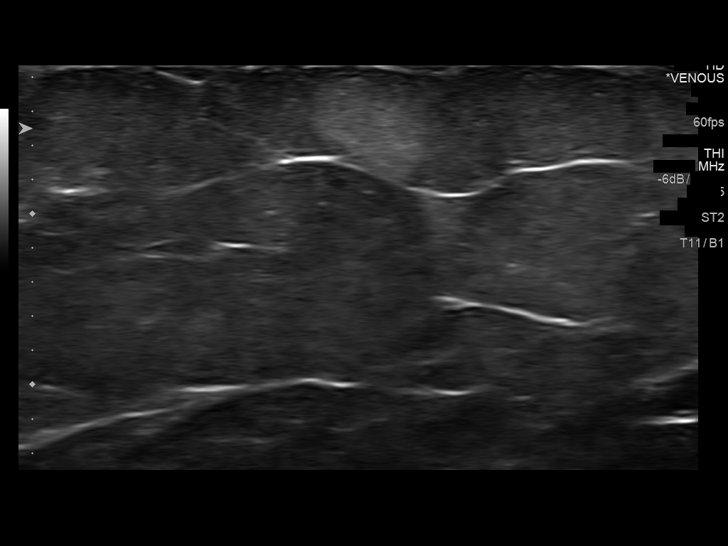
[im 41/45]
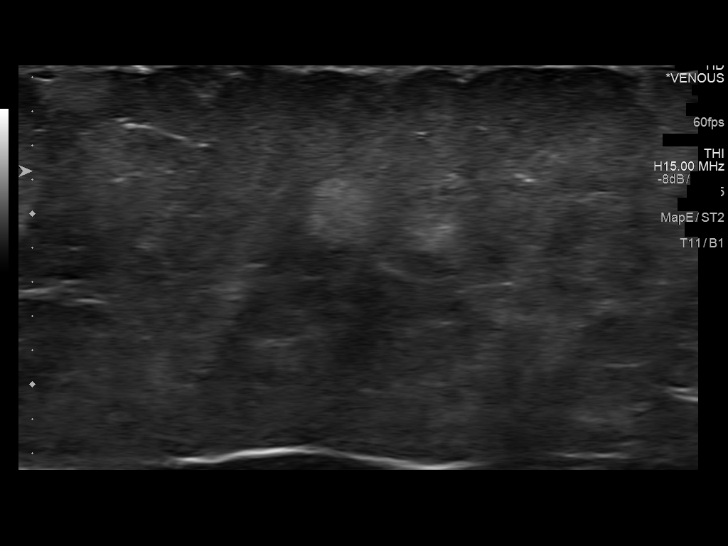
[im 45/45]
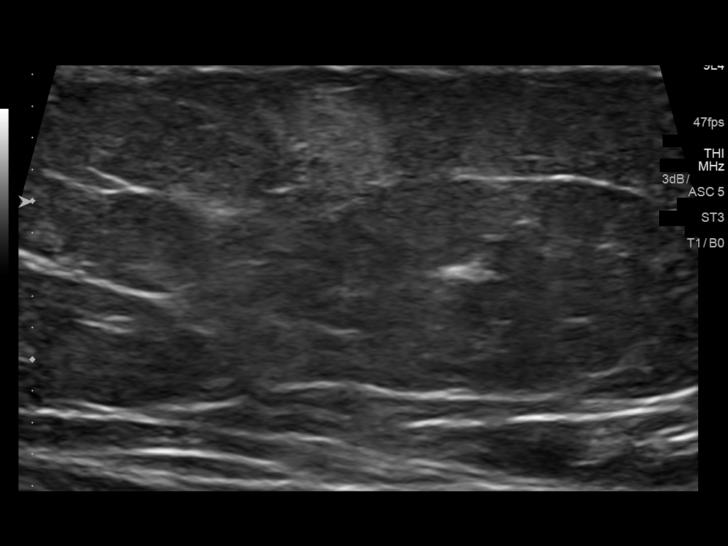

[13 of 24 positions shown; findings below may reference images not displayed]

FINDINGS: Contralateral Common Femoral Vein: Respiratory phasicity is normal
and symmetric with the symptomatic side. No evidence of thrombus.
Normal compressibility.

Common Femoral Vein: No evidence of thrombus. Normal
compressibility, respiratory phasicity and response to augmentation.

Saphenofemoral Junction: No evidence of thrombus. Normal
compressibility and flow on color Doppler imaging.

Profunda Femoral Vein: No evidence of thrombus. Normal
compressibility and flow on color Doppler imaging.

Femoral Vein: No evidence of thrombus. Normal compressibility,
respiratory phasicity and response to augmentation.

Popliteal Vein: No evidence of thrombus. Normal compressibility,
respiratory phasicity and response to augmentation.

Calf Veins: No evidence of thrombus. Normal compressibility and flow
on color Doppler imaging.

Superficial Great Saphenous Vein: No evidence of thrombus. Normal
compressibility and flow on color Doppler imaging.

Venous Reflux:  None.

Other Findings: Scattered echogenic somewhat well-defined lesions
are noted within the muscles of the right thigh.
IMPRESSION: No evidence of right lower extremity deep venous thrombosis. Left
common femoral vein also patent. Multiple scattered echogenic
well-defined lesions are noted in the muscles of the medial right
thigh, primarily distally. The largest of these foci measures 7 x 5
mm. The appearance is most suggestive of intramuscular hematomas.
This appearance is rather unusual, and a followup ultrasound in 4-6
weeks to further evaluate these areas may well be warranted.

## 2016-06-21 ENCOUNTER — Emergency Department (HOSPITAL_BASED_OUTPATIENT_CLINIC_OR_DEPARTMENT_OTHER)
Admission: EM | Admit: 2016-06-21 | Discharge: 2016-06-21 | Disposition: A | Discharge status: Home / Self Care | Payer: Medicare Other | Attending: Emergency Medicine | Admitting: Emergency Medicine

## 2016-06-21 ENCOUNTER — Encounter (HOSPITAL_BASED_OUTPATIENT_CLINIC_OR_DEPARTMENT_OTHER): Payer: Self-pay | Admitting: Respiratory Therapy

## 2016-06-21 ENCOUNTER — Encounter: Payer: Self-pay | Admitting: Allergy and Immunology

## 2016-06-21 ENCOUNTER — Ambulatory Visit (INDEPENDENT_AMBULATORY_CARE_PROVIDER_SITE_OTHER): Payer: Medicare Other | Admitting: Allergy and Immunology

## 2016-06-21 DIAGNOSIS — J0100 Acute maxillary sinusitis, unspecified: Secondary | ICD-10-CM | POA: Diagnosis not present

## 2016-06-21 DIAGNOSIS — Z87891 Personal history of nicotine dependence: Secondary | ICD-10-CM | POA: Insufficient documentation

## 2016-06-21 DIAGNOSIS — I10 Essential (primary) hypertension: Secondary | ICD-10-CM | POA: Insufficient documentation

## 2016-06-21 DIAGNOSIS — J4521 Mild intermittent asthma with (acute) exacerbation: Secondary | ICD-10-CM

## 2016-06-21 DIAGNOSIS — J0191 Acute recurrent sinusitis, unspecified: Secondary | ICD-10-CM | POA: Insufficient documentation

## 2016-06-21 DIAGNOSIS — J45901 Unspecified asthma with (acute) exacerbation: Secondary | ICD-10-CM | POA: Diagnosis not present

## 2016-06-21 DIAGNOSIS — J3089 Other allergic rhinitis: Secondary | ICD-10-CM | POA: Diagnosis not present

## 2016-06-21 DIAGNOSIS — R079 Chest pain, unspecified: Secondary | ICD-10-CM | POA: Diagnosis present

## 2016-06-21 DIAGNOSIS — Z79899 Other long term (current) drug therapy: Secondary | ICD-10-CM | POA: Diagnosis not present

## 2016-06-21 DIAGNOSIS — J019 Acute sinusitis, unspecified: Secondary | ICD-10-CM | POA: Insufficient documentation

## 2016-06-21 MED ORDER — AZELASTINE-FLUTICASONE 137-50 MCG/ACT NA SUSP: NASAL | 5 refills | Status: DC

## 2016-06-21 MED ORDER — AZELASTINE HCL 0.15 % NA SOLN: 2.0000 | Freq: Two times a day (BID) | NASAL | 5 refills | Status: DC

## 2016-06-21 MED ORDER — FLUTICASONE PROPIONATE HFA 110 MCG/ACT IN AERO: 2.0000 | INHALATION_SPRAY | Freq: Two times a day (BID) | RESPIRATORY_TRACT | 5 refills | Status: DC

## 2016-06-21 MED ORDER — BECLOMETHASONE DIPROPIONATE 40 MCG/ACT IN AERS: 2.0000 | INHALATION_SPRAY | Freq: Two times a day (BID) | RESPIRATORY_TRACT | 5 refills | Status: DC
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
## 2016-06-21 NOTE — Assessment & Plan Note (Signed)
   Continue appropriate allergen avoidance measures.  A prescription has been provided for Dymista (as above).  Nasal saline lavage and add guaifenesin as needed for thick mucus drainage.

## 2016-06-21 NOTE — ED Triage Notes (Signed)
Pt seen at md office this am.  Pt states she is worsening with her asthma, started having chest pain after breathing treatment in office.  She has not gotten her meds filled.  Pt states she doesn't feel better after the treatment.
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
## 2016-06-21 NOTE — Assessment & Plan Note (Addendum)
Systemic corticosteroids will not be prescribed due to Jennifer [name]'s history of adverse side effects.    For now, and during respiratory tract infections or asthma flares, add Qvar 40 g to 3 inhalations 2 times per day until symptoms have returned to baseline.  Continue levalbuterol every 4-6 hours as needed and 15 minutes prior to exercise.  The patient has been asked to contact me if her symptoms persist or progress. Otherwise, she may return for follow up in 4 months.

## 2016-06-21 NOTE — Patient Instructions (Addendum)
Asthma with acute exacerbation Systemic corticosteroids will not be prescribed due to Jinnie's history of adverse side effects.    For now, and during respiratory tract infections or asthma flares, add Qvar 40 g to 3 inhalations 2 times per day until symptoms have returned to baseline.  Continue levalbuterol every 4-6 hours as needed and 15 minutes prior to exercise.  The patient has been asked to contact me if her symptoms persist or progress. Otherwise, she may return for follow up in 4 months.  Acute sinusitis  A sample and prescription hav been provided for Dymista nasal spray, 1-2 sprays per nostril 2 times daily. Proper nasal spray technique has been discussed and demonstrated.   Nasal saline lavage (NeilMed) as needed has been recommended prior to medicated nasal sprays and as needed along with instructions for proper administration.  For thick post nasal drainage, add guaifenesin [phone] mg (Mucinex)  twice daily as needed with adequate hydration as discussed.  Other allergic rhinitis  Continue appropriate allergen avoidance measures.  A prescription has been provided for Dymista (as above).  Nasal saline lavage and add guaifenesin as needed for thick mucus drainage.   Return in about 4 months (around 10/19/2016), or if symptoms worsen or fail to improve.

## 2016-06-21 NOTE — Assessment & Plan Note (Addendum)
   A sample and prescription hav been provided for Dymista nasal spray, 1-2 sprays per nostril 2 times daily. Proper nasal spray technique has been discussed and demonstrated.   Nasal saline lavage (NeilMed) as needed has been recommended prior to medicated nasal sprays and as needed along with instructions for proper administration.  For thick post nasal drainage, add guaifenesin [phone] mg (Mucinex)  twice daily as needed with adequate hydration as discussed.

## 2016-06-21 NOTE — Addendum Note (Signed)
Addended by: Gerre Pebbles A on: 06/21/2016 04:53 PM   Modules accepted: Orders

## 2016-06-21 NOTE — ED Notes (Signed)
Ambulated patient in the hallway with pulse OX. O2 stayed between 98-100% , HR 100-104. PA made aware.

## 2016-06-22 DIAGNOSIS — Z885 Allergy status to narcotic agent status: Secondary | ICD-10-CM | POA: Diagnosis not present

## 2016-06-22 DIAGNOSIS — I10 Essential (primary) hypertension: Secondary | ICD-10-CM | POA: Diagnosis not present

## 2016-06-22 DIAGNOSIS — K219 Gastro-esophageal reflux disease without esophagitis: Secondary | ICD-10-CM | POA: Diagnosis not present

## 2016-06-22 DIAGNOSIS — Z880 Allergy status to penicillin: Secondary | ICD-10-CM | POA: Diagnosis not present

## 2016-06-22 DIAGNOSIS — D509 Iron deficiency anemia, unspecified: Secondary | ICD-10-CM | POA: Diagnosis not present

## 2016-06-22 DIAGNOSIS — J329 Chronic sinusitis, unspecified: Secondary | ICD-10-CM | POA: Diagnosis not present

## 2016-06-22 DIAGNOSIS — Z882 Allergy status to sulfonamides status: Secondary | ICD-10-CM | POA: Diagnosis not present

## 2016-06-22 DIAGNOSIS — Z883 Allergy status to other anti-infective agents status: Secondary | ICD-10-CM | POA: Diagnosis not present

## 2016-06-22 DIAGNOSIS — E269 Hyperaldosteronism, unspecified: Secondary | ICD-10-CM | POA: Diagnosis not present

## 2016-06-22 DIAGNOSIS — G35 Multiple sclerosis: Secondary | ICD-10-CM | POA: Diagnosis not present

## 2016-06-22 DIAGNOSIS — I471 Supraventricular tachycardia: Secondary | ICD-10-CM | POA: Diagnosis not present

## 2016-06-22 DIAGNOSIS — Z8589 Personal history of malignant neoplasm of other organs and systems: Secondary | ICD-10-CM | POA: Diagnosis not present

## 2016-06-22 DIAGNOSIS — Z87891 Personal history of nicotine dependence: Secondary | ICD-10-CM | POA: Diagnosis not present

## 2016-06-22 DIAGNOSIS — R7303 Prediabetes: Secondary | ICD-10-CM | POA: Diagnosis not present

## 2016-06-22 DIAGNOSIS — R079 Chest pain, unspecified: Secondary | ICD-10-CM | POA: Diagnosis not present

## 2016-06-22 DIAGNOSIS — E785 Hyperlipidemia, unspecified: Secondary | ICD-10-CM | POA: Diagnosis not present

## 2016-06-22 DIAGNOSIS — K2270 Barrett's esophagus without dysplasia: Secondary | ICD-10-CM | POA: Diagnosis not present

## 2016-06-22 DIAGNOSIS — M549 Dorsalgia, unspecified: Secondary | ICD-10-CM | POA: Diagnosis not present

## 2016-06-22 DIAGNOSIS — J069 Acute upper respiratory infection, unspecified: Secondary | ICD-10-CM | POA: Diagnosis not present

## 2016-06-22 DIAGNOSIS — Z886 Allergy status to analgesic agent status: Secondary | ICD-10-CM | POA: Diagnosis not present

## 2016-06-22 DIAGNOSIS — G8929 Other chronic pain: Secondary | ICD-10-CM | POA: Diagnosis not present

## 2016-06-22 DIAGNOSIS — R0602 Shortness of breath: Secondary | ICD-10-CM | POA: Diagnosis not present

## 2016-06-22 DIAGNOSIS — R569 Unspecified convulsions: Secondary | ICD-10-CM | POA: Diagnosis not present

## 2016-06-23 DIAGNOSIS — Z9289 Personal history of other medical treatment: Secondary | ICD-10-CM | POA: Diagnosis not present

## 2016-06-23 DIAGNOSIS — I10 Essential (primary) hypertension: Secondary | ICD-10-CM | POA: Diagnosis not present

## 2016-06-23 DIAGNOSIS — Z885 Allergy status to narcotic agent status: Secondary | ICD-10-CM | POA: Diagnosis not present

## 2016-06-23 DIAGNOSIS — Z8249 Family history of ischemic heart disease and other diseases of the circulatory system: Secondary | ICD-10-CM | POA: Diagnosis not present

## 2016-06-23 DIAGNOSIS — J3489 Other specified disorders of nose and nasal sinuses: Secondary | ICD-10-CM | POA: Diagnosis not present

## 2016-06-23 DIAGNOSIS — R05 Cough: Secondary | ICD-10-CM | POA: Diagnosis not present

## 2016-06-23 DIAGNOSIS — J069 Acute upper respiratory infection, unspecified: Secondary | ICD-10-CM | POA: Diagnosis not present

## 2016-06-23 DIAGNOSIS — J45901 Unspecified asthma with (acute) exacerbation: Secondary | ICD-10-CM | POA: Diagnosis not present

## 2016-06-23 DIAGNOSIS — Z87891 Personal history of nicotine dependence: Secondary | ICD-10-CM | POA: Diagnosis not present

## 2016-06-23 DIAGNOSIS — Z886 Allergy status to analgesic agent status: Secondary | ICD-10-CM | POA: Diagnosis not present

## 2016-06-23 DIAGNOSIS — R0602 Shortness of breath: Secondary | ICD-10-CM | POA: Diagnosis not present

## 2016-06-23 DIAGNOSIS — Z880 Allergy status to penicillin: Secondary | ICD-10-CM | POA: Diagnosis not present

## 2016-06-23 DIAGNOSIS — R093 Abnormal sputum: Secondary | ICD-10-CM | POA: Diagnosis not present

## 2016-06-23 DIAGNOSIS — Z888 Allergy status to other drugs, medicaments and biological substances status: Secondary | ICD-10-CM | POA: Diagnosis not present

## 2016-06-23 DIAGNOSIS — E785 Hyperlipidemia, unspecified: Secondary | ICD-10-CM | POA: Diagnosis not present

## 2016-06-23 DIAGNOSIS — G4730 Sleep apnea, unspecified: Secondary | ICD-10-CM | POA: Diagnosis not present

## 2016-06-23 DIAGNOSIS — R0600 Dyspnea, unspecified: Secondary | ICD-10-CM | POA: Diagnosis not present

## 2016-06-23 DIAGNOSIS — G35 Multiple sclerosis: Secondary | ICD-10-CM | POA: Diagnosis not present

## 2016-06-25 DIAGNOSIS — R0602 Shortness of breath: Secondary | ICD-10-CM | POA: Diagnosis not present

## 2016-06-25 DIAGNOSIS — R05 Cough: Secondary | ICD-10-CM | POA: Diagnosis not present

## 2016-06-25 DIAGNOSIS — Z885 Allergy status to narcotic agent status: Secondary | ICD-10-CM | POA: Diagnosis not present

## 2016-06-25 DIAGNOSIS — J45909 Unspecified asthma, uncomplicated: Secondary | ICD-10-CM | POA: Diagnosis not present

## 2016-06-25 DIAGNOSIS — J069 Acute upper respiratory infection, unspecified: Secondary | ICD-10-CM | POA: Diagnosis not present

## 2016-06-25 DIAGNOSIS — Z886 Allergy status to analgesic agent status: Secondary | ICD-10-CM | POA: Diagnosis not present

## 2016-06-25 DIAGNOSIS — Z8249 Family history of ischemic heart disease and other diseases of the circulatory system: Secondary | ICD-10-CM | POA: Diagnosis not present

## 2016-06-25 DIAGNOSIS — I10 Essential (primary) hypertension: Secondary | ICD-10-CM | POA: Diagnosis not present

## 2016-06-25 DIAGNOSIS — Z87891 Personal history of nicotine dependence: Secondary | ICD-10-CM | POA: Diagnosis not present

## 2016-06-25 DIAGNOSIS — Z888 Allergy status to other drugs, medicaments and biological substances status: Secondary | ICD-10-CM | POA: Diagnosis not present

## 2016-06-25 DIAGNOSIS — E785 Hyperlipidemia, unspecified: Secondary | ICD-10-CM | POA: Diagnosis not present

## 2016-06-25 DIAGNOSIS — Z880 Allergy status to penicillin: Secondary | ICD-10-CM | POA: Diagnosis not present

## 2016-06-26 ENCOUNTER — Ambulatory Visit (INDEPENDENT_AMBULATORY_CARE_PROVIDER_SITE_OTHER): Payer: Medicare Other | Admitting: Family Medicine

## 2016-06-26 ENCOUNTER — Encounter: Payer: Self-pay | Admitting: Family Medicine

## 2016-06-26 VITALS — BP 137/78 | HR 108 | Wt 206.0 lb

## 2016-06-26 DIAGNOSIS — K219 Gastro-esophageal reflux disease without esophagitis: Secondary | ICD-10-CM | POA: Diagnosis not present

## 2016-06-26 DIAGNOSIS — J209 Acute bronchitis, unspecified: Secondary | ICD-10-CM

## 2016-06-26 DIAGNOSIS — T17308A Unspecified foreign body in larynx causing other injury, initial encounter: Secondary | ICD-10-CM | POA: Diagnosis not present

## 2016-06-26 NOTE — Progress Notes (Signed)
Subjective:    CC: URI   HPI: Patient went back to the hospital yesterday for cough and congestion and shortness of breath that she been experiencing for about a week. They placed her on doxycycline. Chest x-ray is normal.He feels like there something sort of causing her throat to feel swollen and thick. She says it feels like it's hard to breathe that time. She feels like it's mostly in her upper chest and neck area though. She has been having some wheezing and in fact says she got an albuterol breathing treatment yesterday. She is just feeling extremely anxious about it. He was hoping to have spirometry done today and was upset that she was not on the schedule for that. She does have an appointment on Thursday though she will try to get it scheduled then. She does seem to get some relief from the dexamethasone area she had a shot on Monday and Tuesday the ED and did feel like it was helpful. She does have some 1 mg of Topamax is on pills in her back. She wonders if she could take those.   Past medical history, Surgical history, Family history not pertinant except as noted below, Social history, Allergies, and medications have been entered into the medical record, reviewed, and corrections made.   Review of Systems: No fevers, chills, night sweats, weight loss, chest pain.  Objective:    General: Well Developed, well nourished, and in no acute distress.  Neuro: Alert and oriented x3, extra-ocular muscles intact, sensation grossly intact.  HEENT: Normocephalic, atraumatic  Skin: Warm and dry, no rashes. Cardiac: Regular rate and rhythm, no murmurs rubs or gallops, no lower extremity edema.  Respiratory: Clear to auscultation bilaterally. Not using accessory muscles, speaking in full sentences.   Impression and Recommendations:   Bronchitis- today her peak flow is in the green zone. We had a long discussion about only using her albuterol if her peak flow is no longer in the green zone. I  explained it is not going to help the fullness in her throat sensation it's just can open up the small bronchial tubes. I think a lot of her thickness in her throat is actually anxiety related. I did encourage her to just relax and make sure she is hydrating well. I also discussed taking Benadryl to help with any swelling or inflammation that could be from a histamine release. Encouraged her to make sure that she completes her doxycycline. You can use her Xopenex 2-4 puffs as needed if she is in the yellow zone. Okay to use Robitussin for cough. We discussed again about avoiding a very forceful cough which can really irritate inflame the throat. I reassured her that if she is able to blow in the green zone on her peak flow and she is not having anything in her chest that obstructing. The she had a chest x-ray yesterday which was essentially normal. Okay to take the dexamethasone 1 mg tab once a day 5-7 days.  Choking/gagging-she does have a history of reflux issues and strongly encouraged her to restart her ranitidine to get her symptoms back under control.  GERD - see note above.

## 2016-06-26 NOTE — Patient Instructions (Addendum)
Restart your Zantac.   Take you doxycycline Robitussion as per bottle for cough.  Benadry 25mg  twice a day to help with the swelling in your throat.   Only use your Xopenex (2-4 puffs) if you are in the yellow zone on your meter.

## 2016-06-27 ENCOUNTER — Other Ambulatory Visit: Payer: Self-pay | Admitting: Allergy

## 2016-06-27 MED ORDER — FLUTICASONE PROPIONATE HFA 110 MCG/ACT IN AERO: 2.0000 | INHALATION_SPRAY | Freq: Two times a day (BID) | RESPIRATORY_TRACT | 5 refills | Status: DC

## 2016-06-28 ENCOUNTER — Emergency Department (HOSPITAL_BASED_OUTPATIENT_CLINIC_OR_DEPARTMENT_OTHER)
Admission: EM | Admit: 2016-06-28 | Discharge: 2016-06-28 | Disposition: A | Discharge status: Home / Self Care | Payer: Medicare Other | Attending: Emergency Medicine | Admitting: Emergency Medicine

## 2016-06-28 ENCOUNTER — Encounter (HOSPITAL_BASED_OUTPATIENT_CLINIC_OR_DEPARTMENT_OTHER): Payer: Self-pay | Admitting: *Deleted

## 2016-06-28 DIAGNOSIS — I10 Essential (primary) hypertension: Secondary | ICD-10-CM | POA: Insufficient documentation

## 2016-06-28 DIAGNOSIS — R140 Abdominal distension (gaseous): Secondary | ICD-10-CM | POA: Insufficient documentation

## 2016-06-28 DIAGNOSIS — R05 Cough: Secondary | ICD-10-CM | POA: Diagnosis not present

## 2016-06-28 DIAGNOSIS — J4520 Mild intermittent asthma, uncomplicated: Secondary | ICD-10-CM | POA: Diagnosis not present

## 2016-06-28 DIAGNOSIS — Z87891 Personal history of nicotine dependence: Secondary | ICD-10-CM | POA: Insufficient documentation

## 2016-06-28 DIAGNOSIS — Z8541 Personal history of malignant neoplasm of cervix uteri: Secondary | ICD-10-CM | POA: Diagnosis not present

## 2016-06-28 DIAGNOSIS — R059 Cough, unspecified: Secondary | ICD-10-CM

## 2016-06-28 DIAGNOSIS — R079 Chest pain, unspecified: Secondary | ICD-10-CM | POA: Insufficient documentation

## 2016-06-28 DIAGNOSIS — R0981 Nasal congestion: Secondary | ICD-10-CM | POA: Diagnosis not present

## 2016-06-28 DIAGNOSIS — R0602 Shortness of breath: Secondary | ICD-10-CM | POA: Insufficient documentation

## 2016-06-28 DIAGNOSIS — M549 Dorsalgia, unspecified: Secondary | ICD-10-CM | POA: Insufficient documentation

## 2016-06-28 NOTE — ED Notes (Addendum)
Pt c/o cough, congestion x 2 weeks,shortness of breath,  Pt talking complete sentences no distress noted  Has been seen many times for same,  States last pm started having pain between shoulder blades and in rt chest
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
## 2016-06-29 ENCOUNTER — Ambulatory Visit (INDEPENDENT_AMBULATORY_CARE_PROVIDER_SITE_OTHER): Payer: Medicare Other | Admitting: Family Medicine

## 2016-06-29 ENCOUNTER — Encounter: Payer: Self-pay | Admitting: Family Medicine

## 2016-06-29 ENCOUNTER — Encounter: Payer: Medicare Other | Admitting: Family Medicine

## 2016-06-29 VITALS — BP 126/84 | HR 108 | Ht 62.0 in | Wt 205.0 lb

## 2016-06-29 DIAGNOSIS — J209 Acute bronchitis, unspecified: Secondary | ICD-10-CM

## 2016-06-29 DIAGNOSIS — R0602 Shortness of breath: Secondary | ICD-10-CM

## 2016-06-29 DIAGNOSIS — K219 Gastro-esophageal reflux disease without esophagitis: Secondary | ICD-10-CM

## 2016-06-29 DIAGNOSIS — J019 Acute sinusitis, unspecified: Secondary | ICD-10-CM | POA: Diagnosis not present

## 2016-06-29 MED ORDER — LEVOFLOXACIN 500 MG PO TABS: 500.0000 mg | ORAL_TABLET | Freq: Every day | ORAL | 0 refills | Status: DC

## 2016-06-29 MED ORDER — IPRATROPIUM BROMIDE 0.02 % IN SOLN
0.5000 mg | Freq: Once | RESPIRATORY_TRACT | Status: AC
  Administered 2016-06-29: 0.5 mg via RESPIRATORY_TRACT

## 2016-06-29 MED ORDER — BENZONATATE 200 MG PO CAPS: 200.0000 mg | ORAL_CAPSULE | Freq: Two times a day (BID) | ORAL | 0 refills | Status: DC | PRN

## 2016-06-29 MED ORDER — DEXAMETHASONE SODIUM PHOSPHATE 4 MG/ML IJ SOLN
4.0000 mg | Freq: Once | INTRAMUSCULAR | Status: AC
  Administered 2016-06-29: 4 mg via INTRAMUSCULAR

## 2016-06-29 NOTE — Progress Notes (Signed)
Patient came into clinic today for spirometry. Pt reports she has had some increase recently with shortness of breath. Pt was able to preform spirometry well, no immediate complications. Pt was placed in room to go over results with PCP.

## 2016-06-29 NOTE — Progress Notes (Signed)
Error

## 2016-06-29 NOTE — Progress Notes (Signed)
  CC: spirometry    Subjective:    Patient ID: Jennifer [name], female    DOB: [birth year], 50 y.o.   MRN: [id number]  HPI Patient comes in today for spirometry. She's been having episodes of shortness of breath not necessarily with activity. Also some intermittent chest pain with it but she's had significant and extensive cardiac workups in the past. More recently she's been sick with a bronchitis type illness. With increased cough and some sputum production. She does get short of breath with the cough. She's been told in the past that she has asthma.   She did have to stop her doxycycline that she was taking because of GI upset. No fevers chills or sweats. Has felt very fatigued.  Review of Systems     Objective:   Physical Exam  Constitutional: She is oriented to person, place, and time. She appears well-developed and well-nourished.  HENT:  Head: Normocephalic and atraumatic.  Right Ear: External ear normal.  Left Ear: External ear normal.  Nose: Nose normal.  Mouth/Throat: Oropharynx is clear and moist.  TMs and canals are clear.   Eyes: Conjunctivae and EOM are normal. Pupils are equal, round, and reactive to light.  Neck: Neck supple. No thyromegaly present.  Cardiovascular: Normal rate, regular rhythm and normal heart sounds.   Pulmonary/Chest: Effort normal and breath sounds normal. She has no wheezes.  Lymphadenopathy:    She has no cervical adenopathy.  Neurological: She is alert and oriented to person, place, and time.  Skin: Skin is warm and dry.  Psychiatric: She has a normal mood and affect.       Assessment & Plan:  Shortness of breath,-spirometry is actually essentially normal today. No significant improvement postbronchodilator with levalbuterol. Gave reassurance that even though at times she is feeling short of breath and heavy in her neck and upper chest that she is actually moving air extremely well and her pulse ox is normal which is very reassuring. I think  some of the discomfort that she is feeling is just inflammation from her persistent coughing. We discussed that most importantly we need to get her cough under control. Next  Acute bronchitis/sinusitis-we'll try to treat the cough itself with Tessalon Perles. She still continues to have significant nasal symptoms as well. She had to stop her doxycycline because it was upsetting her stomach. Will switch to Levaquin. She has multiple drug allergies as seen on her medication list. She tends to get into cyclical coughing episodes. Also given steroid injection here in the office today.

## 2016-06-29 NOTE — Patient Instructions (Signed)
Try hard to suppress your cough some.

## 2016-07-03 DIAGNOSIS — Z87891 Personal history of nicotine dependence: Secondary | ICD-10-CM | POA: Diagnosis not present

## 2016-07-03 DIAGNOSIS — G4730 Sleep apnea, unspecified: Secondary | ICD-10-CM | POA: Diagnosis not present

## 2016-07-03 DIAGNOSIS — M791 Myalgia: Secondary | ICD-10-CM | POA: Diagnosis not present

## 2016-07-03 DIAGNOSIS — E785 Hyperlipidemia, unspecified: Secondary | ICD-10-CM | POA: Diagnosis not present

## 2016-07-03 DIAGNOSIS — D649 Anemia, unspecified: Secondary | ICD-10-CM | POA: Diagnosis not present

## 2016-07-03 DIAGNOSIS — R0600 Dyspnea, unspecified: Secondary | ICD-10-CM | POA: Diagnosis not present

## 2016-07-03 DIAGNOSIS — M25511 Pain in right shoulder: Secondary | ICD-10-CM | POA: Diagnosis not present

## 2016-07-03 DIAGNOSIS — M25512 Pain in left shoulder: Secondary | ICD-10-CM | POA: Diagnosis not present

## 2016-07-03 DIAGNOSIS — Z8249 Family history of ischemic heart disease and other diseases of the circulatory system: Secondary | ICD-10-CM | POA: Diagnosis not present

## 2016-07-03 DIAGNOSIS — Z9289 Personal history of other medical treatment: Secondary | ICD-10-CM | POA: Diagnosis not present

## 2016-07-03 DIAGNOSIS — R490 Dysphonia: Secondary | ICD-10-CM | POA: Diagnosis not present

## 2016-07-03 DIAGNOSIS — Z885 Allergy status to narcotic agent status: Secondary | ICD-10-CM | POA: Diagnosis not present

## 2016-07-03 DIAGNOSIS — R062 Wheezing: Secondary | ICD-10-CM | POA: Diagnosis not present

## 2016-07-03 DIAGNOSIS — I10 Essential (primary) hypertension: Secondary | ICD-10-CM | POA: Diagnosis not present

## 2016-07-03 DIAGNOSIS — G35 Multiple sclerosis: Secondary | ICD-10-CM | POA: Diagnosis not present

## 2016-07-03 DIAGNOSIS — Z888 Allergy status to other drugs, medicaments and biological substances status: Secondary | ICD-10-CM | POA: Diagnosis not present

## 2016-07-03 DIAGNOSIS — Z886 Allergy status to analgesic agent status: Secondary | ICD-10-CM | POA: Diagnosis not present

## 2016-07-03 DIAGNOSIS — R05 Cough: Secondary | ICD-10-CM | POA: Diagnosis not present

## 2016-07-04 ENCOUNTER — Other Ambulatory Visit: Payer: Self-pay | Admitting: *Deleted

## 2016-07-05 ENCOUNTER — Emergency Department (HOSPITAL_BASED_OUTPATIENT_CLINIC_OR_DEPARTMENT_OTHER): Payer: Medicare Other

## 2016-07-05 ENCOUNTER — Encounter (HOSPITAL_BASED_OUTPATIENT_CLINIC_OR_DEPARTMENT_OTHER): Payer: Self-pay

## 2016-07-05 ENCOUNTER — Emergency Department (HOSPITAL_BASED_OUTPATIENT_CLINIC_OR_DEPARTMENT_OTHER)
Admission: EM | Admit: 2016-07-05 | Discharge: 2016-07-05 | Disposition: A | Discharge status: Home / Self Care | Payer: Medicare Other | Attending: Emergency Medicine | Admitting: Emergency Medicine

## 2016-07-05 DIAGNOSIS — I10 Essential (primary) hypertension: Secondary | ICD-10-CM | POA: Insufficient documentation

## 2016-07-05 DIAGNOSIS — R059 Cough, unspecified: Secondary | ICD-10-CM

## 2016-07-05 DIAGNOSIS — Z8541 Personal history of malignant neoplasm of cervix uteri: Secondary | ICD-10-CM | POA: Diagnosis not present

## 2016-07-05 DIAGNOSIS — Z87891 Personal history of nicotine dependence: Secondary | ICD-10-CM | POA: Diagnosis not present

## 2016-07-05 DIAGNOSIS — Z79899 Other long term (current) drug therapy: Secondary | ICD-10-CM | POA: Diagnosis not present

## 2016-07-05 DIAGNOSIS — R05 Cough: Secondary | ICD-10-CM | POA: Insufficient documentation

## 2016-07-05 DIAGNOSIS — R062 Wheezing: Secondary | ICD-10-CM | POA: Insufficient documentation

## 2016-07-05 DIAGNOSIS — R079 Chest pain, unspecified: Secondary | ICD-10-CM | POA: Insufficient documentation

## 2016-07-05 MED ORDER — DEXAMETHASONE 4 MG PO TABS: 4.0000 mg | ORAL_TABLET | Freq: Two times a day (BID) | ORAL | 0 refills | Status: DC

## 2016-07-05 MED ORDER — DEXAMETHASONE SODIUM PHOSPHATE 10 MG/ML IJ SOLN
4.0000 mg | Freq: Once | INTRAMUSCULAR | Status: AC
Start: 2016-07-05 — End: 2016-07-05
  Administered 2016-07-05: 4 mg via INTRAMUSCULAR
  Filled 2016-07-05: qty 1

## 2016-07-05 NOTE — ED Notes (Signed)
States has had a cough off and on for weeks. States SOB today
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
## 2016-07-05 NOTE — ED Triage Notes (Signed)
Reports coughing and wheezing.  Reports coughing up yellow/white frothy sputum.  Patient having no difficulty speaking.  Patient reports she has been to multiple places and numerous steriod shots and nothing is helping.

## 2016-07-07 DIAGNOSIS — Z885 Allergy status to narcotic agent status: Secondary | ICD-10-CM | POA: Diagnosis not present

## 2016-07-07 DIAGNOSIS — E785 Hyperlipidemia, unspecified: Secondary | ICD-10-CM | POA: Diagnosis not present

## 2016-07-07 DIAGNOSIS — Z79899 Other long term (current) drug therapy: Secondary | ICD-10-CM | POA: Diagnosis not present

## 2016-07-07 DIAGNOSIS — J40 Bronchitis, not specified as acute or chronic: Secondary | ICD-10-CM | POA: Diagnosis not present

## 2016-07-07 DIAGNOSIS — Z87891 Personal history of nicotine dependence: Secondary | ICD-10-CM | POA: Diagnosis not present

## 2016-07-07 DIAGNOSIS — R112 Nausea with vomiting, unspecified: Secondary | ICD-10-CM | POA: Diagnosis not present

## 2016-07-07 DIAGNOSIS — Z886 Allergy status to analgesic agent status: Secondary | ICD-10-CM | POA: Diagnosis not present

## 2016-07-07 DIAGNOSIS — Z888 Allergy status to other drugs, medicaments and biological substances status: Secondary | ICD-10-CM | POA: Diagnosis not present

## 2016-07-07 DIAGNOSIS — R05 Cough: Secondary | ICD-10-CM | POA: Diagnosis not present

## 2016-07-07 DIAGNOSIS — R6883 Chills (without fever): Secondary | ICD-10-CM | POA: Diagnosis not present

## 2016-07-07 DIAGNOSIS — J45909 Unspecified asthma, uncomplicated: Secondary | ICD-10-CM | POA: Diagnosis not present

## 2016-07-07 DIAGNOSIS — I10 Essential (primary) hypertension: Secondary | ICD-10-CM | POA: Diagnosis not present

## 2016-07-09 IMAGING — CT CT HEAD W/O CM
1 series · 15 of 30 positions shown, 19 images · non-contrast
Comparison: CT of the head performed 10/22/2014, and MRI of the
brain performed 09/10/2014

CLINICAL DATA: Acute onset of headache and hyperglycemia. Visual
changes and temporal pain. Initial encounter.

EXAM:
CT HEAD WITHOUT CONTRAST
TECHNIQUE: Contiguous axial images were obtained from the base of the skull
through the vertex without intravenous contrast.

[Series 2: head 4.8 h37s · axial · 0.50mm/px · z∈[-109,+51]mm · 15 of 36 slices shown, 19 images]
[im 2/36  brain]
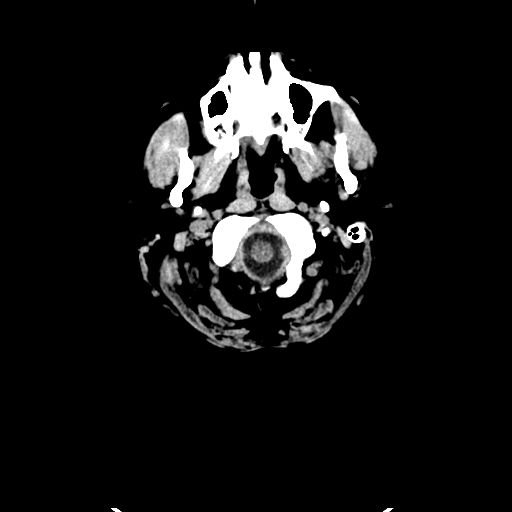
[im 2/36  bone]
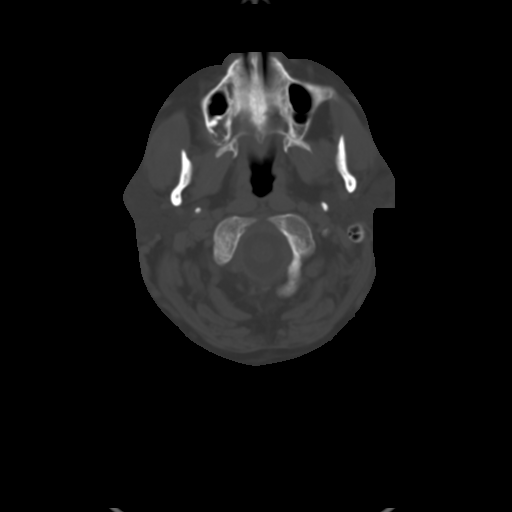
[im 4/36  brain]
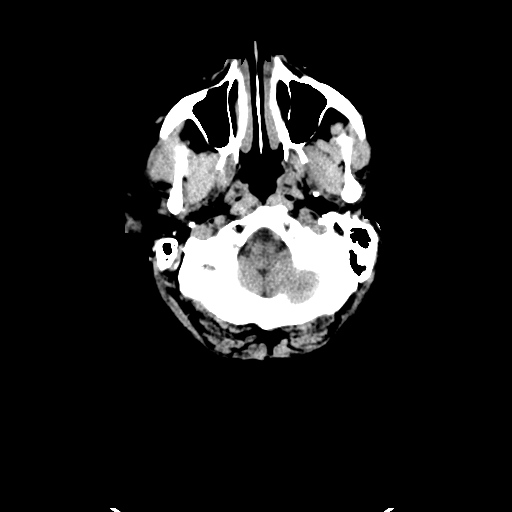
[im 7/36  brain]
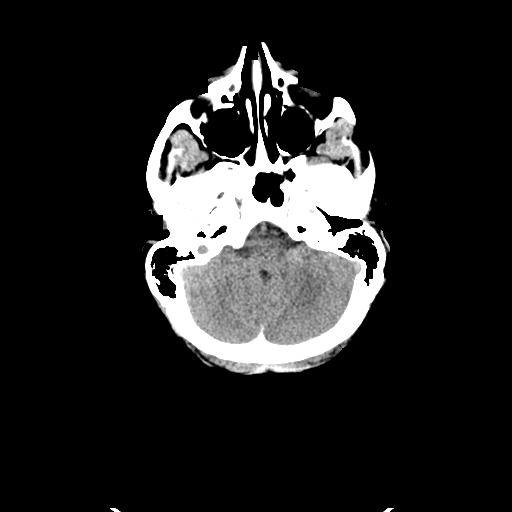
[im 9/36  brain]
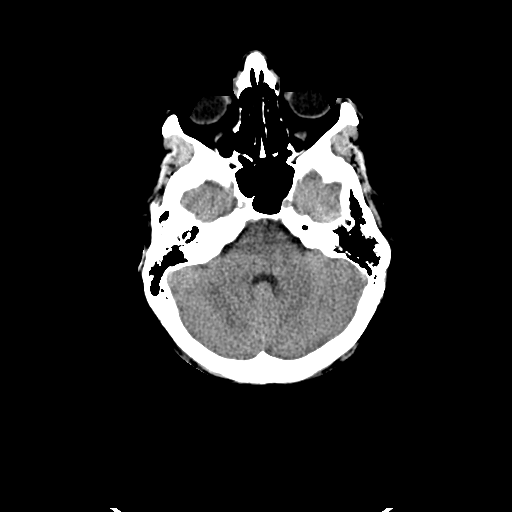
[im 11/36  brain]
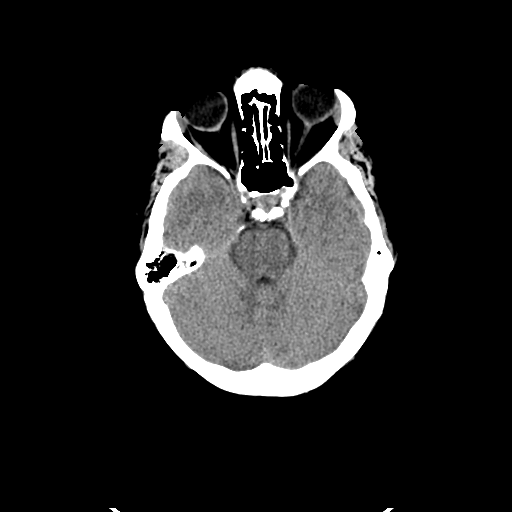
[im 11/36  bone]
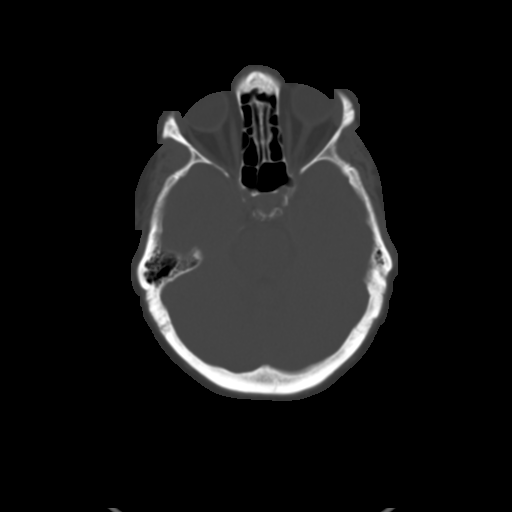
[im 14/36  brain]
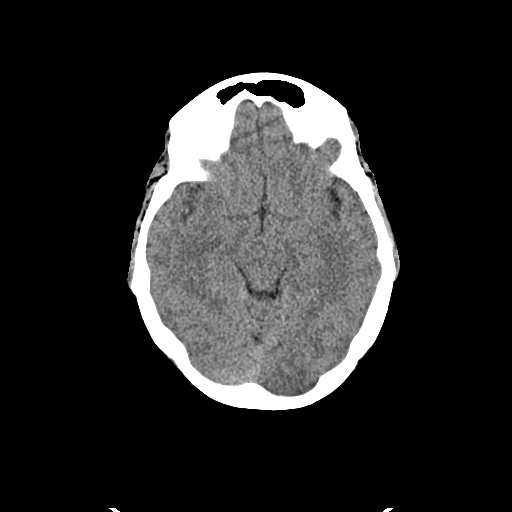
[im 16/36  brain]
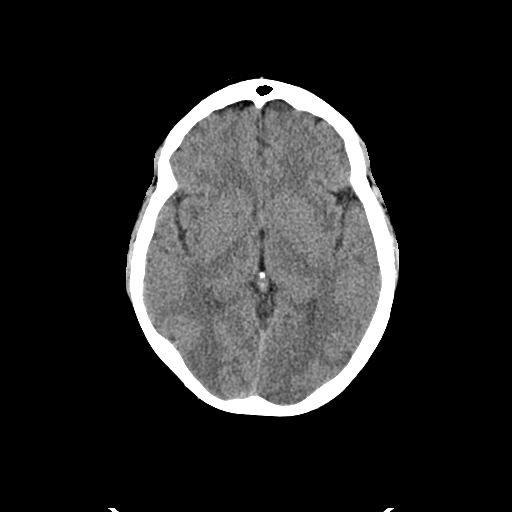
[im 19/36  brain]
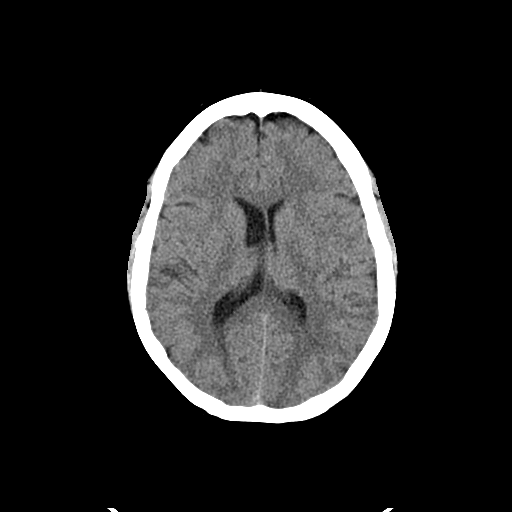
[im 20/36  brain]
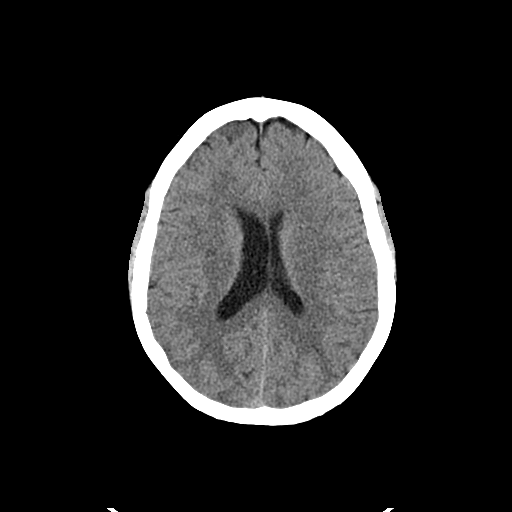
[im 20/36  bone]
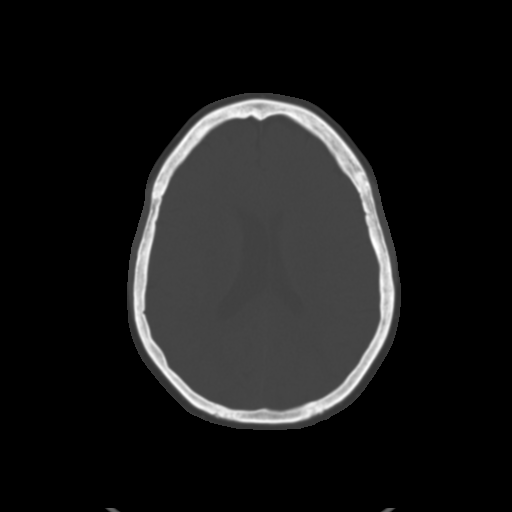
[im 22/36  brain]
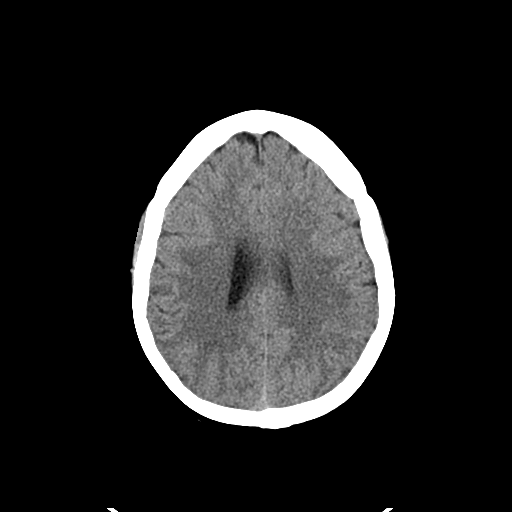
[im 25/36  brain]
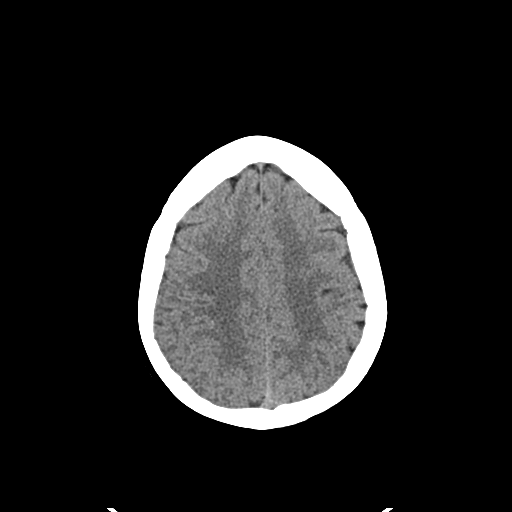
[im 27/36  brain]
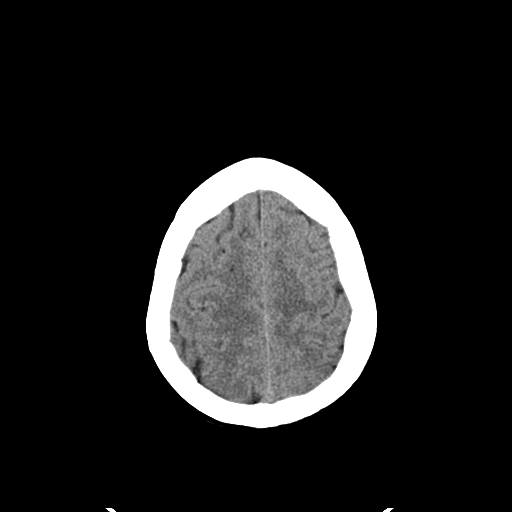
[im 29/36  brain]
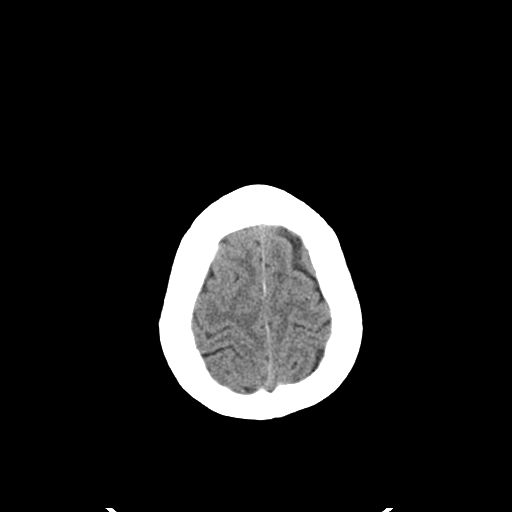
[im 29/36  bone]
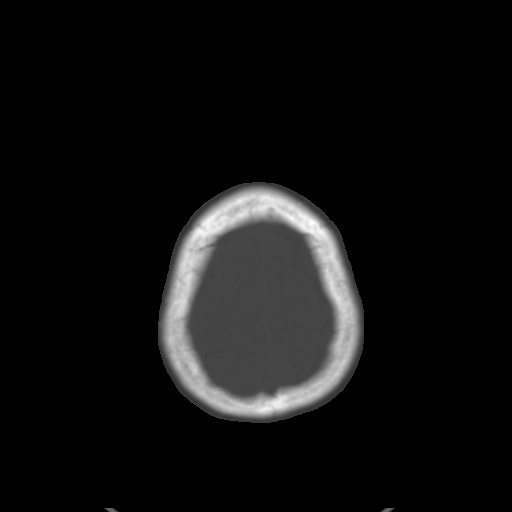
[im 32/36  brain]
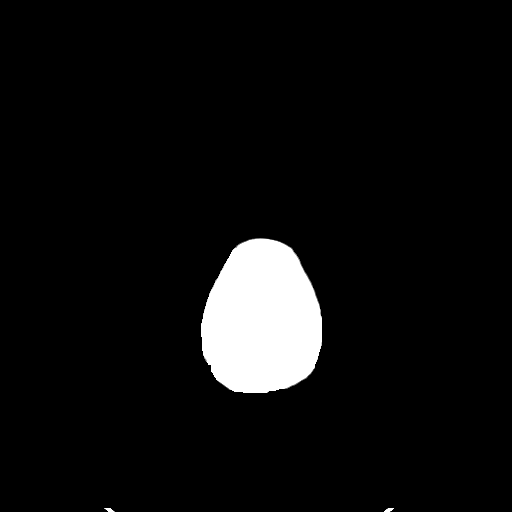
[im 34/36  brain]
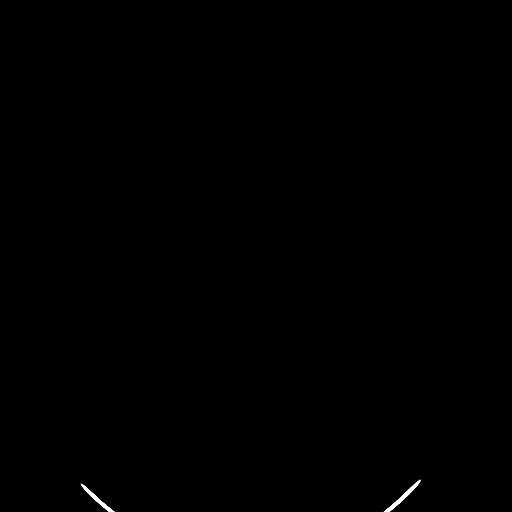

[15 of 30 positions shown; findings below may reference images not displayed]

FINDINGS: There is no evidence of acute infarction, or intra- or extra-axial
hemorrhage on CT.

A stable tiny 6 mm left parafalcine meningioma is noted.

The posterior fossa, including the cerebellum, brainstem and fourth
ventricle, is within normal limits. The third and lateral
ventricles, and basal ganglia are unremarkable in appearance. The
cerebral hemispheres are symmetric in appearance, with normal
gray-white differentiation. No mass effect or midline shift is seen.

There is no evidence of fracture; visualized osseous structures are
unremarkable in appearance. The orbits are within normal limits. The
paranasal sinuses and mastoid air cells are well-aerated. No
significant soft tissue abnormalities are seen.
IMPRESSION: 1. No acute intracranial pathology seen on CT.
2. Stable tiny 6 mm left parafalcine meningioma noted.

## 2016-07-10 ENCOUNTER — Telehealth: Payer: Self-pay | Admitting: Family Medicine

## 2016-07-10 MED ORDER — LEVOFLOXACIN 500 MG PO TABS: 500.0000 mg | ORAL_TABLET | Freq: Every day | ORAL | 0 refills | Status: DC

## 2016-07-10 NOTE — Telephone Encounter (Signed)
rx sent to pharmacy.Jennifer [name]

## 2016-07-10 NOTE — Telephone Encounter (Signed)
Patient called and would like to know if she can have another round of antibiotics called in Levaquin 500 still having congestion, cough and she has changed pharmacy to [facility] on [address] and [facility] in [place]. Thanks

## 2016-07-11 DIAGNOSIS — R05 Cough: Secondary | ICD-10-CM | POA: Diagnosis not present

## 2016-07-11 DIAGNOSIS — R0989 Other specified symptoms and signs involving the circulatory and respiratory systems: Secondary | ICD-10-CM | POA: Diagnosis not present

## 2016-07-11 DIAGNOSIS — G35 Multiple sclerosis: Secondary | ICD-10-CM | POA: Diagnosis not present

## 2016-07-11 DIAGNOSIS — E876 Hypokalemia: Secondary | ICD-10-CM | POA: Diagnosis not present

## 2016-07-11 DIAGNOSIS — I10 Essential (primary) hypertension: Secondary | ICD-10-CM | POA: Diagnosis not present

## 2016-07-11 DIAGNOSIS — D649 Anemia, unspecified: Secondary | ICD-10-CM | POA: Diagnosis not present

## 2016-07-11 DIAGNOSIS — E785 Hyperlipidemia, unspecified: Secondary | ICD-10-CM | POA: Diagnosis not present

## 2016-07-11 DIAGNOSIS — Z87891 Personal history of nicotine dependence: Secondary | ICD-10-CM | POA: Diagnosis not present

## 2016-07-11 DIAGNOSIS — R062 Wheezing: Secondary | ICD-10-CM | POA: Diagnosis not present

## 2016-07-11 LAB — BASIC METABOLIC PANEL
BUN: 16 mg/dL (ref 4–21)
Creatinine: 0.7 mg/dL (ref 0.5–1.1)
Glucose: 140 mg/dL
Potassium: 3.9 mmol/L (ref 3.4–5.3)
Sodium: 145 mmol/L (ref 137–147)

## 2016-07-12 ENCOUNTER — Telehealth: Payer: Self-pay

## 2016-07-12 NOTE — Telephone Encounter (Signed)
Notified patient of recommendations.

## 2016-07-12 NOTE — Telephone Encounter (Signed)
We just tested her for diabets in September and she is pre-diabetic.  If sugar goes up need to increase water intake and avoid sweets and carbs.

## 2016-07-12 NOTE — Telephone Encounter (Signed)
Pt was recently at [facility].  They Rx her dexmethasone.  After taking the med pt became dizzy and sweaty. She check her BS and it was at 298. This morning when she checked it fasting it was around 140.  She wants to know what she should do when this happens? She is concerned that she is a diabetic. Please advise.

## 2016-07-13 ENCOUNTER — Encounter: Payer: Self-pay | Admitting: Family Medicine

## 2016-07-13 ENCOUNTER — Ambulatory Visit (INDEPENDENT_AMBULATORY_CARE_PROVIDER_SITE_OTHER): Payer: Medicare Other | Admitting: Family Medicine

## 2016-07-13 VITALS — BP 144/76 | HR 93 | Temp 98.2°F | Ht 62.0 in | Wt 207.0 lb

## 2016-07-13 DIAGNOSIS — J209 Acute bronchitis, unspecified: Secondary | ICD-10-CM

## 2016-07-13 DIAGNOSIS — R058 Other specified cough: Secondary | ICD-10-CM

## 2016-07-13 DIAGNOSIS — R7309 Other abnormal glucose: Secondary | ICD-10-CM | POA: Diagnosis not present

## 2016-07-13 DIAGNOSIS — R05 Cough: Secondary | ICD-10-CM

## 2016-07-13 DIAGNOSIS — E876 Hypokalemia: Secondary | ICD-10-CM

## 2016-07-13 NOTE — Progress Notes (Signed)
Subjective:    CC: F/U cough and congestion.    HPI:  She is still not feeling well. Has been not feeling well for almost 4 weeks.  She still has nasal congestion and cough. She was treated with prednisone, doxy (didn't finish course) , and 5 days of Levaquin. She initially had more of a dry cough and then became more productive. Now she feels like it's more in her upper airway. She feels like it's swollen and at times it's been hard to breathe. Her husband has had a similar upper respiratory infection. Trying to drink hot tea with honey. She's been taking some Mucinex DM. She has not been using her nasal steroid recently because she was doing well with her allergies. No fevers or chills. She does feel short of breath at times.  Tessalon Perles.    Her blood sugars have been high as well. Mostly while she's been on the prednisone. She bought her own home glucometer and has been monitoring it. The highest was over 200. Her blood sugar was 95 this morning.  Past medical history, Surgical history, Family history not pertinant except as noted below, Social history, Allergies, and medications have been entered into the medical record, reviewed, and corrections made.   Review of Systems: No fevers, chills, night sweats, weight loss, chest pain.   Objective:    General: Well Developed, well nourished, and in no acute distress.  Neuro: Alert and oriented x3, extra-ocular muscles intact, sensation grossly intact.  HEENT: Normocephalic, atraumatic  Skin: Warm and dry, no rashes. Cardiac: Regular rate and rhythm, no murmurs rubs or gallops, no lower extremity edema.  Respiratory: Clear to auscultation bilaterally. Not using accessory muscles, speaking in full sentences.   Impression and Recommendations:    Post infectious cough. Gave reassurance.  Reassured her doesn't need another antibiotic.  Can use her Mucinex DM. Can fill the 4 days of prednisone given by the ED.    Given an order for ending  labs every 2 weeks to follow her potassium. She's not currently taking her potassium supplement. She feels she doesn't need it right now.  Elevated glucose-secondary to prednisone. We just a test for diabetes in September and her A1c was 5.7 at that time. We can certainly recheck the level after 90 days which would be the end of December. Discussed the importance of making sure that she is avoiding concentrated sweets and eating a low carb diet while on prednisone. Will continue to monitor.  Time spent 30 minutes, greater than 50% time spent counseling about her cough and recent illness.

## 2016-07-13 NOTE — Patient Instructions (Signed)
Make sure taking Zantac Restart  Your nasal spray.

## 2016-07-14 ENCOUNTER — Encounter (HOSPITAL_BASED_OUTPATIENT_CLINIC_OR_DEPARTMENT_OTHER): Payer: Self-pay | Admitting: *Deleted

## 2016-07-14 ENCOUNTER — Emergency Department (HOSPITAL_BASED_OUTPATIENT_CLINIC_OR_DEPARTMENT_OTHER): Payer: Medicare Other

## 2016-07-14 ENCOUNTER — Emergency Department (HOSPITAL_BASED_OUTPATIENT_CLINIC_OR_DEPARTMENT_OTHER)
Admission: EM | Admit: 2016-07-14 | Discharge: 2016-07-14 | Disposition: A | Discharge status: Home / Self Care | Payer: Medicare Other | Attending: Emergency Medicine | Admitting: Emergency Medicine

## 2016-07-14 DIAGNOSIS — Z87891 Personal history of nicotine dependence: Secondary | ICD-10-CM | POA: Insufficient documentation

## 2016-07-14 DIAGNOSIS — J45901 Unspecified asthma with (acute) exacerbation: Secondary | ICD-10-CM | POA: Diagnosis not present

## 2016-07-14 DIAGNOSIS — R05 Cough: Secondary | ICD-10-CM | POA: Diagnosis not present

## 2016-07-14 DIAGNOSIS — R059 Cough, unspecified: Secondary | ICD-10-CM

## 2016-07-14 DIAGNOSIS — R220 Localized swelling, mass and lump, head: Secondary | ICD-10-CM | POA: Diagnosis not present

## 2016-07-14 DIAGNOSIS — I10 Essential (primary) hypertension: Secondary | ICD-10-CM | POA: Insufficient documentation

## 2016-07-14 DIAGNOSIS — R0602 Shortness of breath: Secondary | ICD-10-CM | POA: Diagnosis not present

## 2016-07-14 DIAGNOSIS — R51 Headache: Secondary | ICD-10-CM | POA: Diagnosis not present

## 2016-07-14 LAB — CBC WITH DIFFERENTIAL/PLATELET
Basophils Absolute: 0 10*3/uL (ref 0.0–0.1)
Basophils Relative: 1 %
Eosinophils Absolute: 0.2 10*3/uL (ref 0.0–0.7)
Eosinophils Relative: 3 %
HCT: 40.3 % (ref 36.0–46.0)
Hemoglobin: 13.2 g/dL (ref 12.0–15.0)
Lymphocytes Relative: 22 %
Lymphs Abs: 1.4 10*3/uL (ref 0.7–4.0)
MCH: 30.1 pg (ref 26.0–34.0)
MCHC: 32.8 g/dL (ref 30.0–36.0)
MCV: 92 fL (ref 78.0–100.0)
Monocytes Absolute: 0.7 10*3/uL (ref 0.1–1.0)
Monocytes Relative: 10 %
Neutro Abs: 4.3 10*3/uL (ref 1.7–7.7)
Neutrophils Relative %: 64 %
Platelets: 178 10*3/uL (ref 150–400)
RBC: 4.38 MIL/uL (ref 3.87–5.11)
RDW: 13.7 % (ref 11.5–15.5)
WBC: 6.5 10*3/uL (ref 4.0–10.5)

## 2016-07-14 LAB — BASIC METABOLIC PANEL
Anion gap: 9 (ref 5–15)
BUN: 12 mg/dL (ref 6–20)
CO2: 24 mmol/L (ref 22–32)
Calcium: 8.5 mg/dL — ABNORMAL LOW (ref 8.9–10.3)
Chloride: 107 mmol/L (ref 101–111)
Creatinine, Ser: 0.58 mg/dL (ref 0.44–1.00)
GFR calc Af Amer: >60 mL/min (ref >60)
GFR calc non Af Amer: >60 mL/min (ref >60)
Glucose, Bld: 91 mg/dL (ref 65–99)
Potassium: 3.4 mmol/L — ABNORMAL LOW (ref 3.5–5.1)
Sodium: 140 mmol/L (ref 135–145)

## 2016-07-14 MED ORDER — BENZONATATE 100 MG PO CAPS: 100.0000 mg | ORAL_CAPSULE | Freq: Three times a day (TID) | ORAL | 0 refills | Status: DC
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
## 2016-07-14 NOTE — ED Triage Notes (Signed)
Facial swelling. States her ears hurt and she has a sinus infection and URI.

## 2016-07-17 ENCOUNTER — Telehealth: Payer: Self-pay | Admitting: Family Medicine

## 2016-07-17 MED ORDER — POTASSIUM CHLORIDE 20 MEQ/15ML (10%) PO SOLN: 10.0000 meq | Freq: Every day | ORAL | 1 refills | Status: DC

## 2016-07-17 NOTE — Telephone Encounter (Signed)
Should be on her med list. Ok to refill.

## 2016-07-17 NOTE — Telephone Encounter (Signed)
Pt states she went to the [facility] ED and her potassium was 3.4. Pt is requesting an Rx for liquid potassium. Will route to PCP. Pt would like this to go to [facility] in [place] point on [address].

## 2016-07-18 ENCOUNTER — Telehealth: Payer: Self-pay | Admitting: *Deleted

## 2016-07-18 DIAGNOSIS — Z87891 Personal history of nicotine dependence: Secondary | ICD-10-CM | POA: Diagnosis not present

## 2016-07-18 DIAGNOSIS — R079 Chest pain, unspecified: Secondary | ICD-10-CM | POA: Diagnosis not present

## 2016-07-18 DIAGNOSIS — Z886 Allergy status to analgesic agent status: Secondary | ICD-10-CM | POA: Diagnosis not present

## 2016-07-18 DIAGNOSIS — R05 Cough: Secondary | ICD-10-CM | POA: Diagnosis not present

## 2016-07-18 DIAGNOSIS — Z888 Allergy status to other drugs, medicaments and biological substances status: Secondary | ICD-10-CM | POA: Diagnosis not present

## 2016-07-18 DIAGNOSIS — R61 Generalized hyperhidrosis: Secondary | ICD-10-CM | POA: Diagnosis not present

## 2016-07-18 DIAGNOSIS — R0602 Shortness of breath: Secondary | ICD-10-CM | POA: Diagnosis not present

## 2016-07-18 DIAGNOSIS — Z8249 Family history of ischemic heart disease and other diseases of the circulatory system: Secondary | ICD-10-CM | POA: Diagnosis not present

## 2016-07-18 DIAGNOSIS — R0600 Dyspnea, unspecified: Secondary | ICD-10-CM | POA: Diagnosis not present

## 2016-07-18 DIAGNOSIS — Z885 Allergy status to narcotic agent status: Secondary | ICD-10-CM | POA: Diagnosis not present

## 2016-07-18 DIAGNOSIS — J45909 Unspecified asthma, uncomplicated: Secondary | ICD-10-CM | POA: Diagnosis not present

## 2016-07-18 DIAGNOSIS — I10 Essential (primary) hypertension: Secondary | ICD-10-CM | POA: Diagnosis not present

## 2016-07-18 DIAGNOSIS — R42 Dizziness and giddiness: Secondary | ICD-10-CM | POA: Diagnosis not present

## 2016-07-18 DIAGNOSIS — E785 Hyperlipidemia, unspecified: Secondary | ICD-10-CM | POA: Diagnosis not present

## 2016-07-18 DIAGNOSIS — G35 Multiple sclerosis: Secondary | ICD-10-CM | POA: Diagnosis not present

## 2016-07-18 DIAGNOSIS — Z9289 Personal history of other medical treatment: Secondary | ICD-10-CM | POA: Diagnosis not present

## 2016-07-18 DIAGNOSIS — E876 Hypokalemia: Secondary | ICD-10-CM

## 2016-07-18 LAB — BASIC METABOLIC PANEL
BUN: 13 mg/dL (ref 4–21)
Creatinine: 0.6 mg/dL (ref 0.5–1.1)
Glucose: 110 mg/dL
Potassium: 4 mmol/L (ref 3.4–5.3)
Sodium: 143 mmol/L (ref 137–147)

## 2016-07-18 NOTE — Telephone Encounter (Signed)
Jennifer [name] called and stated that pt's labs were not placed as a standing order. However, she would go ahead and take the order. This was an [place].   When I looked back at the order the lab was placed as a standing lab.[name] [place]

## 2016-07-18 NOTE — Telephone Encounter (Signed)
Lab order faxed.Jennifer [name] [place]

## 2016-07-20 ENCOUNTER — Ambulatory Visit (INDEPENDENT_AMBULATORY_CARE_PROVIDER_SITE_OTHER): Payer: Medicare Other | Admitting: Family Medicine

## 2016-07-20 ENCOUNTER — Ambulatory Visit (HOSPITAL_BASED_OUTPATIENT_CLINIC_OR_DEPARTMENT_OTHER)
Admission: RE | Admit: 2016-07-20 | Discharge: 2016-07-20 | Disposition: A | Discharge status: Home / Self Care | Payer: Medicare Other | Source: Ambulatory Visit | Attending: Family Medicine | Admitting: Family Medicine

## 2016-07-20 ENCOUNTER — Encounter: Payer: Self-pay | Admitting: Family Medicine

## 2016-07-20 VITALS — BP 154/88 | HR 92 | Temp 99.1°F | Ht 62.0 in | Wt 206.0 lb

## 2016-07-20 DIAGNOSIS — T814XXA Infection following a procedure, initial encounter: Secondary | ICD-10-CM | POA: Diagnosis not present

## 2016-07-20 DIAGNOSIS — M79662 Pain in left lower leg: Secondary | ICD-10-CM | POA: Diagnosis not present

## 2016-07-20 DIAGNOSIS — E860 Dehydration: Secondary | ICD-10-CM

## 2016-07-20 DIAGNOSIS — R079 Chest pain, unspecified: Secondary | ICD-10-CM | POA: Diagnosis not present

## 2016-07-20 DIAGNOSIS — T8140XA Infection following a procedure, unspecified, initial encounter: Secondary | ICD-10-CM

## 2016-07-20 LAB — POCT URINALYSIS DIPSTICK
Bilirubin, UA: NEGATIVE
Blood, UA: NEGATIVE
Glucose, UA: NEGATIVE
Leukocytes, UA: NEGATIVE
Nitrite, UA: NEGATIVE
Protein, UA: NEGATIVE
Spec Grav, UA: 1.02
Urobilinogen, UA: 0.2
pH, UA: 5

## 2016-07-20 LAB — GLUCOSE, POCT (MANUAL RESULT ENTRY): POC Glucose: 102 mg/dl — AB (ref 70–99)

## 2016-07-20 NOTE — Progress Notes (Signed)
Subjective:    CC: URI  HPI:  50 year old female here today to follow-up for recent emergency department visit. I saw her proximally 6 days ago for as per respiratory symptoms. She had significant cough. In fact I had seen her the last couple of times for the similar symptoms. Evidently she started to experience right-sided chest pain and pain under the axilla and so went to the emergency department on December 5, proximal 2 days ago. When I last saw her I recommended that she really work hard on trying to suppress the cough as she does tend to get into a coughing type cycle which irritates her throat and then causes more cough. We also discussed not taking another antibiotic because at this point I felt like her cough was postinfectious in nature.Is concerned because she starting to feel like her throat is swelling again. She feels like her voice is still going in and out. They she does admit that her cough is a little better than it was.  Pain in her left calf around her ankle and into the calf. Felt like intense pain and numbness that lasted for 3-4 min. Then the leg felt weak.  She had been previously getting spasms in that leg.  She has a small bruise in that location.    She admits she has not been drinking as much water as she should over the last couple days as well. She's felt more sweaty but has not had a fever. Even on the way here she felt sweaty. She said she didn't get a chance to check her blood glucose.  Past medical history, Surgical history, Family history not pertinant except as noted below, Social history, Allergies, and medications have been entered into the medical record, reviewed, and corrections made.   Review of Systems: No fevers, chills, night sweats, weight loss, chest pain, or shortness of breath.   Objective:    General: Well Developed, well nourished, and in no acute distress.  Neuro: Alert and oriented x3, extra-ocular muscles intact, sensation grossly intact.   HEENT: Normocephalic, atraumaticTongue is clear.  Skin: Warm and dry, no rashes. Cardiac: Regular rate and rhythm, no murmurs rubs or gallops, no lower extremity edema.  Respiratory: Clear to auscultation bilaterally. Not using accessory muscles, speaking in full sentences. Ext:  I did measure both calves. She's 39 and half centimeters on the left calf and 40 cm on the right No Significant Difference. No Induration. There Is a Small bruise just above the medial malleolus on the left leg. She is extremely tender with calf squeeze on the upper portion of the calf muscle. Ankle with normal range of motion. Dorsal pedal pulse 2+. Good capillary refill of the toes , left foot.    Impression and Recommendations:     Left calf pain-since pain was so sudden in onset and she has tenderness on squeeze of the calf today I would recommend that we schedule her for venous Doppler. She has been in bed and much more sedentary over the last 4 weeks and typical for her. It may also just be a pain or spasm in the muscle that she feels like it's different from a typical charley horse muscle spasm.  Postinfectious cough-I still feel like she has a postinfectious cough. Lungs are completely clear on exam today. Personally I do feel like her voice is actually much stronger and not nearly as weak as it was when I saw her about a week ago. I tried to give her a  sponge reassurance as I could.   Right sided chest pain-I do not think this is related to pneumonia or a pulmonary embolism at this point. She's had recurrent chest pain on and off and does have a chronically elevated d-dimer which is another reason I did not check that today.  Diaphoresis-unclear cause. They when I look back at my note she's been complaining of sweating for several months I really don't think that this is new. Her blood glucose here in the office was 102 which is normal a reassurance.

## 2016-07-21 DIAGNOSIS — R1011 Right upper quadrant pain: Secondary | ICD-10-CM | POA: Diagnosis not present

## 2016-07-21 DIAGNOSIS — R109 Unspecified abdominal pain: Secondary | ICD-10-CM | POA: Diagnosis not present

## 2016-07-21 DIAGNOSIS — R110 Nausea: Secondary | ICD-10-CM | POA: Diagnosis not present

## 2016-07-21 DIAGNOSIS — Z87891 Personal history of nicotine dependence: Secondary | ICD-10-CM | POA: Diagnosis not present

## 2016-07-21 DIAGNOSIS — D649 Anemia, unspecified: Secondary | ICD-10-CM | POA: Diagnosis not present

## 2016-07-21 DIAGNOSIS — G35 Multiple sclerosis: Secondary | ICD-10-CM | POA: Diagnosis not present

## 2016-07-21 DIAGNOSIS — E785 Hyperlipidemia, unspecified: Secondary | ICD-10-CM | POA: Diagnosis not present

## 2016-07-21 DIAGNOSIS — I10 Essential (primary) hypertension: Secondary | ICD-10-CM | POA: Diagnosis not present

## 2016-07-21 DIAGNOSIS — G4730 Sleep apnea, unspecified: Secondary | ICD-10-CM | POA: Diagnosis not present

## 2016-07-21 DIAGNOSIS — K5900 Constipation, unspecified: Secondary | ICD-10-CM | POA: Diagnosis not present

## 2016-07-21 DIAGNOSIS — J45909 Unspecified asthma, uncomplicated: Secondary | ICD-10-CM | POA: Diagnosis not present

## 2016-07-21 DIAGNOSIS — Z8249 Family history of ischemic heart disease and other diseases of the circulatory system: Secondary | ICD-10-CM | POA: Diagnosis not present

## 2016-07-25 ENCOUNTER — Encounter: Payer: Self-pay | Admitting: Family Medicine

## 2016-07-25 ENCOUNTER — Ambulatory Visit: Payer: Medicare Other | Admitting: Pediatrics

## 2016-07-25 ENCOUNTER — Ambulatory Visit (INDEPENDENT_AMBULATORY_CARE_PROVIDER_SITE_OTHER): Payer: Medicare Other | Admitting: Family Medicine

## 2016-07-25 VITALS — BP 146/79 | HR 112 | Ht 62.0 in | Wt 207.0 lb

## 2016-07-25 DIAGNOSIS — E538 Deficiency of other specified B group vitamins: Secondary | ICD-10-CM | POA: Diagnosis not present

## 2016-07-25 DIAGNOSIS — M7752 Other enthesopathy of left foot: Secondary | ICD-10-CM

## 2016-07-25 DIAGNOSIS — M25572 Pain in left ankle and joints of left foot: Secondary | ICD-10-CM | POA: Diagnosis not present

## 2016-07-25 DIAGNOSIS — R42 Dizziness and giddiness: Secondary | ICD-10-CM | POA: Diagnosis not present

## 2016-07-25 DIAGNOSIS — E559 Vitamin D deficiency, unspecified: Secondary | ICD-10-CM

## 2016-07-25 DIAGNOSIS — R05 Cough: Secondary | ICD-10-CM | POA: Diagnosis not present

## 2016-07-25 DIAGNOSIS — R058 Other specified cough: Secondary | ICD-10-CM

## 2016-07-25 NOTE — Progress Notes (Signed)
Subjective:    CC:   HPI:  She is following up today. When I last saw her about 5 days ago she was having left calf pain and it was tender with squeezing. We sent her for a Doppler since she has chronically elevated d-dimer levels and it was negative. We did follow-up with her the following day and she was still having some pain there. We discussed using heat or ice and gentle stretches to try to work it out.  She says still having pain on the top of her foot and numbness on the bottom of her foot.  She did fall off a stool several weeks ago and wonders if she may have injured it around that time that didn't really start bothering her until about a week or so ago.  She also still continues to have an upper respiratory type inflammation/infection going on. She still has voice hoarseness and cough. Still feels chest is congested some. Mostly in the Right lower lung area Below her breasts. She still feels like there's some tightness and congestion there and, radiates towards her back..   She does have an upcoming appointment with ENT in 2 days as well as a GI appointment on December 20 at [facility] gastroenterology.  She says she just doesn't feel well today. Work up feeling a little dizzy and lightheaded. She's also been feeling her palpitations a little bit more. Pulse running around 112.  She also doesn't like her new dry eye medication Lifitegrast.  She says she wonders if it causing a little bit of irritation. She noticed that the lid will look just slightly red after use and she says it makes her eyes feel more goopy.  Past medical history, Surgical history, Family history not pertinant except as noted below, Social history, Allergies, and medications have been entered into the medical record, reviewed, and corrections made.   Review of Systems: No fevers, chills, night sweats, weight loss, chest pain, or shortness of breath. His complain of feeling a little lightheaded today.  Objective:     General: Well Developed, well nourished, and in no acute distress.  Neuro: Alert and oriented x3, extra-ocular muscles intact, sensation grossly intact.  HEENT: Normocephalic, atraumatic  Skin: Warm and dry, no rashes. Cardiac: Regular rate and rhythm, no murmurs rubs or gallops, no lower extremity edema.  Respiratory: Clear to auscultation bilaterally. Not using accessory muscles, speaking in full sentences. MSK: Left ankle is nontender on exam. Normal range of motion. No swelling or edema or erythema or rash. She has some pain with dorsiflexion against resistance and with eversion against resistance.   Impression and Recommendations:   Post infectious cough-still consistent with postinfectious cough. It's not completely resolved but does continue to improve slowly.  Dizziness-we'll have her do orthostatics today. Did encourage her to push fluids.    Left ankle pain/suspect left anterior tibial tendinopathy-we'll place her in an ASO for the next 2 weeks to rest it. She does not do well with NSAIDs. Ice as needed. And then after 2 weeks will start working on strengthening dorsiflexors. I'll have her start by doing exercises where she uses her toe to draw the Altab[place] and then can start doing exercises against resistance with a band. Given handout today.  HTN - Repeat blood pressure doing orthostatics was improved. We'll continue to monitor.  Dry eye syndrome-encouraged her to call her eye doctor and see if they can switch her back to Restasis which she feels like works better.  Patient will like to have her vitamin D and B12 checked. She has had deficiencies of both previously. In fact when she was seeing Dr. Berdine Addison she was actually getting B12 injections and has not done this in quite some time.

## 2016-07-27 ENCOUNTER — Ambulatory Visit: Payer: Self-pay | Admitting: Family Medicine

## 2016-07-27 DIAGNOSIS — Z883 Allergy status to other anti-infective agents status: Secondary | ICD-10-CM | POA: Diagnosis not present

## 2016-07-27 DIAGNOSIS — R0602 Shortness of breath: Secondary | ICD-10-CM | POA: Diagnosis not present

## 2016-07-27 DIAGNOSIS — K219 Gastro-esophageal reflux disease without esophagitis: Secondary | ICD-10-CM | POA: Diagnosis not present

## 2016-07-27 DIAGNOSIS — Z79899 Other long term (current) drug therapy: Secondary | ICD-10-CM | POA: Diagnosis not present

## 2016-07-27 DIAGNOSIS — G35 Multiple sclerosis: Secondary | ICD-10-CM | POA: Diagnosis not present

## 2016-07-27 DIAGNOSIS — R1011 Right upper quadrant pain: Secondary | ICD-10-CM | POA: Diagnosis not present

## 2016-07-27 DIAGNOSIS — Z882 Allergy status to sulfonamides status: Secondary | ICD-10-CM | POA: Diagnosis not present

## 2016-07-27 DIAGNOSIS — Z8589 Personal history of malignant neoplasm of other organs and systems: Secondary | ICD-10-CM | POA: Diagnosis not present

## 2016-07-27 DIAGNOSIS — Z888 Allergy status to other drugs, medicaments and biological substances status: Secondary | ICD-10-CM | POA: Diagnosis not present

## 2016-07-27 DIAGNOSIS — Z87891 Personal history of nicotine dependence: Secondary | ICD-10-CM | POA: Diagnosis not present

## 2016-07-27 DIAGNOSIS — Z881 Allergy status to other antibiotic agents status: Secondary | ICD-10-CM | POA: Diagnosis not present

## 2016-07-27 DIAGNOSIS — E785 Hyperlipidemia, unspecified: Secondary | ICD-10-CM | POA: Diagnosis not present

## 2016-07-27 DIAGNOSIS — J45909 Unspecified asthma, uncomplicated: Secondary | ICD-10-CM | POA: Diagnosis not present

## 2016-07-27 DIAGNOSIS — K5900 Constipation, unspecified: Secondary | ICD-10-CM | POA: Diagnosis not present

## 2016-07-27 DIAGNOSIS — Z880 Allergy status to penicillin: Secondary | ICD-10-CM | POA: Diagnosis not present

## 2016-07-27 DIAGNOSIS — R109 Unspecified abdominal pain: Secondary | ICD-10-CM | POA: Diagnosis not present

## 2016-07-27 DIAGNOSIS — Z885 Allergy status to narcotic agent status: Secondary | ICD-10-CM | POA: Diagnosis not present

## 2016-07-27 DIAGNOSIS — Z91018 Allergy to other foods: Secondary | ICD-10-CM | POA: Diagnosis not present

## 2016-07-29 IMAGING — CR DG ABDOMEN ACUTE W/ 1V CHEST
4 series · 4 of 4 positions shown · non-contrast
Comparison: None.

CLINICAL DATA: Acute abdominal pain and distention.

EXAM:
DG ABDOMEN ACUTE W/ 1V CHEST

[w chest pa]
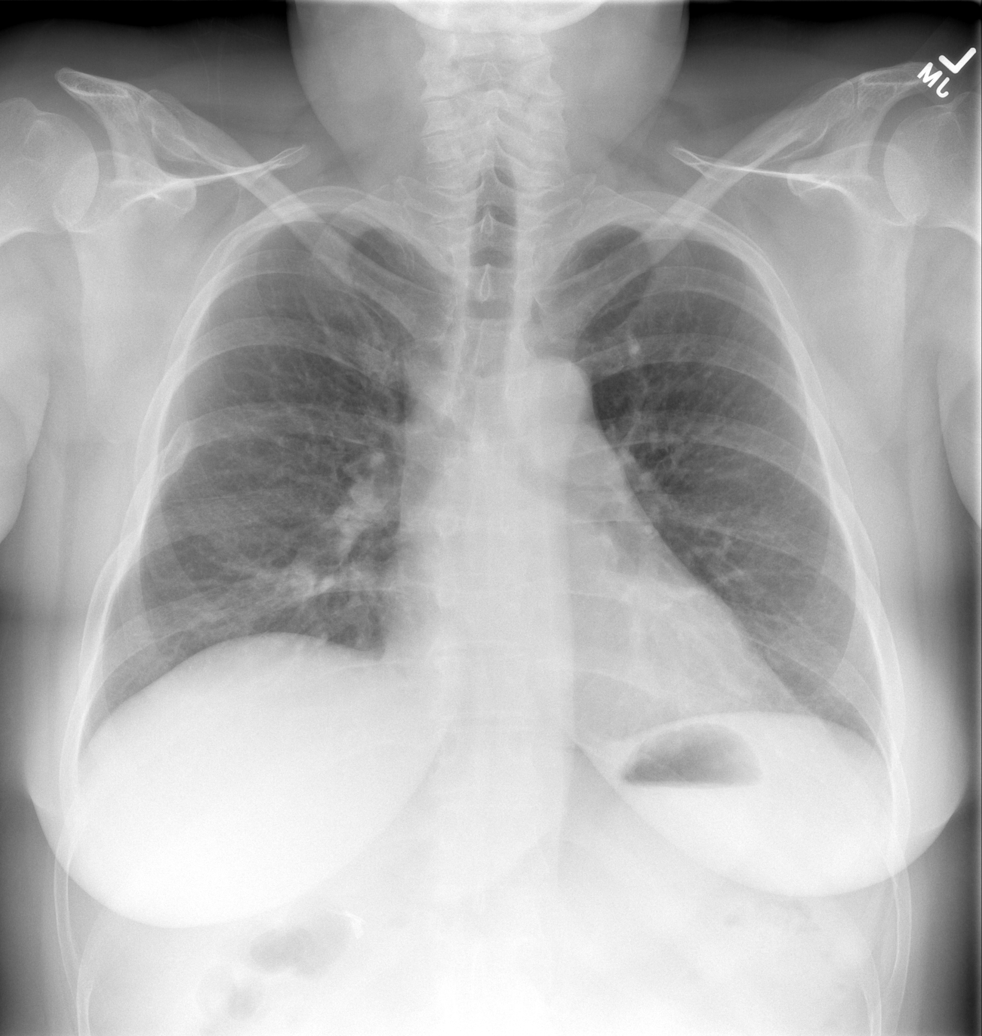

[w abdomen upright *]
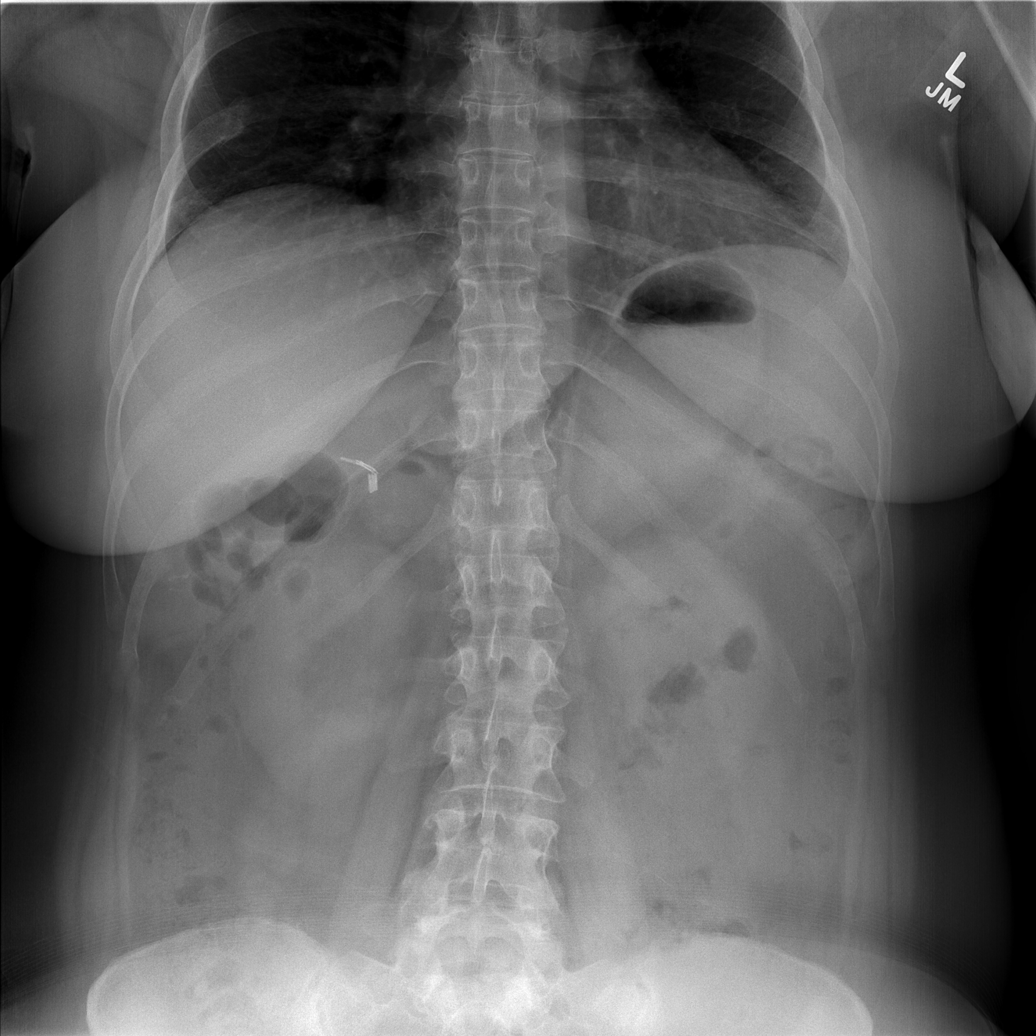

[t abdomen supine (1 of 2)]
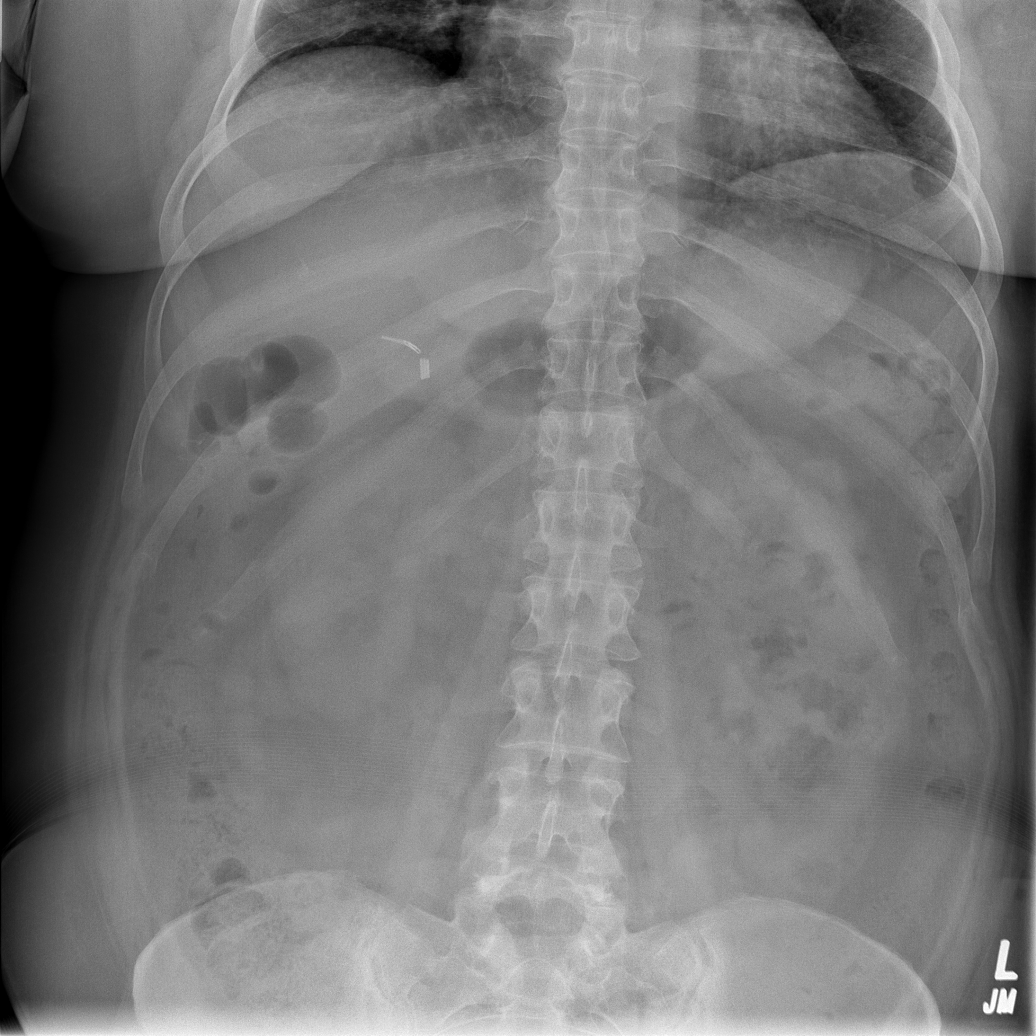

[t abdomen supine (2 of 2)]
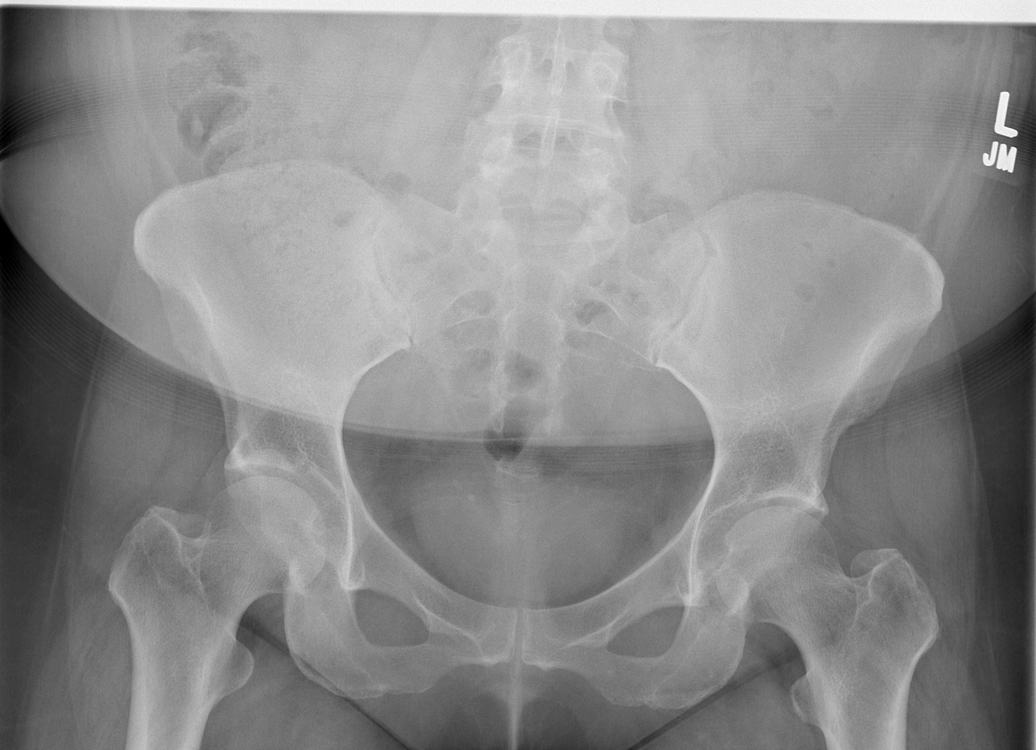

[4 of 4 positions shown; findings below may reference images not displayed]

FINDINGS: The cardiomediastinal silhouette is unremarkable.

The lungs are clear.

There is no evidence of airspace disease, pleural effusion or
pneumothorax.

The bowel gas pattern is unremarkable.

There is no evidence of bowel obstruction or pneumoperitoneum.

No suspicious calcifications are identified.

Cholecystectomy clips are noted. A remote right rib fracture is
identified.
IMPRESSION: Under abdominal radiographs.  No acute cardiopulmonary disease.

## 2016-07-31 ENCOUNTER — Ambulatory Visit (INDEPENDENT_AMBULATORY_CARE_PROVIDER_SITE_OTHER): Payer: Medicare Other | Admitting: Pediatrics

## 2016-07-31 ENCOUNTER — Encounter: Payer: Self-pay | Admitting: Pediatrics

## 2016-07-31 VITALS — BP 174/98 | HR 97 | Temp 98.8°F | Resp 20

## 2016-07-31 DIAGNOSIS — D649 Anemia, unspecified: Secondary | ICD-10-CM | POA: Diagnosis not present

## 2016-07-31 DIAGNOSIS — J4520 Mild intermittent asthma, uncomplicated: Secondary | ICD-10-CM | POA: Diagnosis not present

## 2016-07-31 DIAGNOSIS — Z79899 Other long term (current) drug therapy: Secondary | ICD-10-CM | POA: Insufficient documentation

## 2016-07-31 DIAGNOSIS — J342 Deviated nasal septum: Secondary | ICD-10-CM | POA: Diagnosis not present

## 2016-07-31 DIAGNOSIS — K219 Gastro-esophageal reflux disease without esophagitis: Secondary | ICD-10-CM | POA: Diagnosis not present

## 2016-07-31 DIAGNOSIS — I472 Ventricular tachycardia, unspecified: Secondary | ICD-10-CM

## 2016-07-31 DIAGNOSIS — E876 Hypokalemia: Secondary | ICD-10-CM | POA: Diagnosis not present

## 2016-07-31 DIAGNOSIS — J383 Other diseases of vocal cords: Secondary | ICD-10-CM

## 2016-07-31 DIAGNOSIS — E559 Vitamin D deficiency, unspecified: Secondary | ICD-10-CM | POA: Diagnosis not present

## 2016-07-31 DIAGNOSIS — J3089 Other allergic rhinitis: Secondary | ICD-10-CM

## 2016-07-31 DIAGNOSIS — E538 Deficiency of other specified B group vitamins: Secondary | ICD-10-CM | POA: Diagnosis not present

## 2016-07-31 LAB — BASIC METABOLIC PANEL
BUN: 13 mg/dL (ref 4–21)
Creatinine: 0.6 mg/dL (ref 0.5–1.1)
Glucose: 130 mg/dL
Potassium: 4 mmol/L (ref 3.4–5.3)
Sodium: 139 mmol/L (ref 137–147)

## 2016-07-31 LAB — VITAMIN D 25 HYDROXY (VIT D DEFICIENCY, FRACTURES): Vit D, 25-Hydroxy: 25

## 2016-07-31 LAB — VITAMIN B12: VITAMIN B12: 346

## 2016-07-31 MED ORDER — LEVALBUTEROL TARTRATE 45 MCG/ACT IN AERO: 2.0000 | INHALATION_SPRAY | Freq: Four times a day (QID) | RESPIRATORY_TRACT | 5 refills | Status: DC | PRN

## 2016-07-31 NOTE — Progress Notes (Signed)
[place] [id number] Dept: [phone]  FOLLOW UP NOTE  Patient ID: Jennifer [name], female    DOB: 1966/06/12  Age: 50 y.o. 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 The cough started 1 month ago and the shortness of breath 2 days ago. She has vocal cord dysfunction and mild asthma. She has not improved significantly with inhaled   steroids, montelukast . She cannot take albuterol. She has gastroesophageal reflux. She sees a cardiologist for supraventricular tachycardia and is on 100 mg of labetalol which could affect her breathing function. She is seeing her gastroenterologist in 2 days. She had an ENT exam at [facility] and reported a mild deviation to the right side. She has a history of paroxysmal ventricular tachycardia. Steroids increase her glucose  Current medications are Xopenex 2 puffs every 6 hours if needed and ranitidine 150 mg once a day. Her other medications are outlined in the chart   Drug Allergies:  Allergies  Allergen Reactions  . Aspirin Other (See Comments) and Hives    flushing flushing  . Azithromycin Other (See Comments), Itching and Shortness Of Breath    Lip swelling, SOB.  Lip swelling, SOB.   . Codeine Shortness Of Breath  . Coreg [Carvedilol] Shortness Of Breath    CP  . Moxifloxacin Itching, Other (See Comments), Rash and Shortness Of Breath    Shortness of breath  . Mushroom Extract Complex Anaphylaxis  . Nitrofurantoin Shortness Of Breath    REACTION: sweats  . Promethazine Hcl Anaphylaxis    jittery  . Promethazine Hcl Anaphylaxis    jittery  . Beta Adrenergic Blockers Other (See Comments)    Feels like chest tightening "Metoprolol" Feels like chest tighting Feels like chest tighting "Metoprolol"  . Cetirizine Other (See Comments) and Rash    Broke out in hives the day  before, took Zyrtec &  wasn't sure if this made it worse All over body Broke out in hives the day before, took Zyrtec &  wasn't sure if this made it worse  . Serotonin Reuptake Inhibitors (Ssris) Other (See Comments)    Headache  . Telmisartan Swelling  . Ace Inhibitors Swelling  . Atenolol Other (See Comments)    Squeezing chest sensation Squeezing chest sensation  . Avelox [Moxifloxacin Hcl In Nacl] Itching and Other (See Comments)    Shortness of breath  . Butorphanol Tartrate     REACTION: unknown  . Butorphanol Tartrate Other (See Comments)    Patient aggitated Patient aggitated REACTION: unknown Patient aggitated  . Ciprofloxacin     REACTION: tongue swells  . Clonidine Hcl     REACTION: makes blood pressure high  . Clonidine Hydrochloride     REACTION: makes blood pressure high  . Cortisone   . Cyprodenate Itching  . Doxycycline   . Fentanyl     High [facility] record   . Fluoxetine Hcl Other (See Comments)    REACTION: headaches  . Iron   . Ketorolac Other (See Comments)    I can't sit still  . Ketorolac Tromethamine     jittery  . Ketorolac Tromethamine     jittery  . Labetalol Other (See Comments)    "Really feeling bad"  . Lac Bovis Other (See Comments)    Other reaction(s): Angioedema (ALLERGY/intolerance) 05/02/2013 Ige Results show mild IgE of  0.11 Other reaction(s): Angioedema (ALLERGY/intolerance) 05/02/2013 Ige Results show mild IgE of 0.11  . Lactalbumin   . Lidocaine Other (See Comments)    "It messes me up".  "I can't take it."  . Lisinopril Cough    REACTION: cough  . Metoclopramide Hcl Other (See Comments)    Has a twitchy feeling  . Metoprolol     Other reaction(s): OTHER  . Milk-Related Compounds   . Montelukast Other (See Comments)    Don't remember Don't remember  . Montelukast Sodium     Unknown"Singulair"  . Naproxen   . Other Other (See Comments)    Other reaction(s): Other (See Comments) Uncoded Allergy.  Allergen: IRON IV, Other Reaction: Not Assessed Other reaction(s): Other (See Comments) Uncoded Allergy. Allergen: steriods, Other Reaction: Not Assessed Uncoded Allergy. Allergen: IRON IV, Other Reaction: Not Assessed Uncoded Allergy. Allergen: steriods, Other Reaction: Not Assessed Other reaction(s): Flushing (ALLERGY/intolerance), GI Upset (intolerance), Hypertension (intolerance), Increased Heart Rate (intolerance), Mental Status Changes (intolerance), Other (See Comments), Tachycardia / Palpitations(intolerance) Hospital gowns leave a rash. Anything sticky leaves a rash. Heart monitor tapes cause a very bad rash. Antiemetics makes jittery. Anti-nausea medication causes unknown reaction--PT can only take Zofran. All pain medication has unknown reaction. Antibiotics cause unknown reaction--except Levaquin. Steroids cause hives and redness.  . Paroxetine Other (See Comments)    REACTION: headaches  . Pravastatin Other (See Comments)    Myalgias  . Promethazine Other (See Comments)    I can't sit still  . Sertraline Hcl     REACTION: headaches  . Sertraline Hcl     REACTION: headaches  . Spironolactone   . Stelazine [Trifluoperazine]   . Sulfamethoxazole     Other reaction(s): Other (See Comments) Not sure about reaction; was a long time ago  . Tobramycin     itching , rash  . Trifluoperazine Hcl     REACTION: unknown  . Trifluoperazine Hcl     REACTION: unknown  . Vancomycin     Other reaction(s): Other (See Comments), Unknown Other Reaction: all mycins  . Versed [Midazolam]     High [facility] medical record  . Whey   . Adhesive [Tape] Rash    EKG monitor patches, some tapes"reddnes,blisters"  . Butorphanol Anxiety  . Ceftriaxone Rash  . Ceftriaxone Sodium Rash  . Erythromycin Rash  . Erythromycin Base Itching and Rash  . Metoclopramide Itching and Other (See Comments)    Other reaction(s): Other (See Comments) Makes me talk funny Other reaction(s):  Agitation Has a twitchy feeling  . Metronidazole Rash  . Penicillins Rash  . Prednisone Anxiety and Palpitations  . Prochlorperazine Anxiety  . Prochlorperazine Edisylate Anxiety  . Quinolones Rash and Swelling  . Sulfa Antibiotics Other (See Comments) and Rash    Other reaction(s): SHORTNESS OF BREATH  . Sulfonamide Derivatives Rash  . Venlafaxine Anxiety  . Zyrtec [Cetirizine Hcl] Rash    All over body    Physical Exam: BP (!) 174/98   Pulse 97   Temp 98.8 F (37.1 C) (Oral)   Resp 20   LMP 06/25/2013   SpO2 97%    Physical Exam  Constitutional: She is oriented to person, place, and time. She appears well-developed and well-nourished.  HENT:  Eyes normal. Ears normal. Nose mild swelling of nasal turbinates. Pharynx normal.  Neck: Neck supple.  Cardiovascular:  S1 and S2 normal no murmurs  Pulmonary/Chest:  Clear to percussion auscultation  Lymphadenopathy:    She has no cervical adenopathy.  Neurological: She is alert and oriented to person, place, and time.  Psychiatric: She has a normal mood and affect. Her behavior is normal. Judgment and thought content normal.  Vitals reviewed.   Diagnostics:  FVC 2.82 L FEV1 2.47 L. Predicted FVC 3.28 L predicted FEV1 2.60 L-the spirometry is in the normal range  Assessment and Plan: 1. Mild intermittent asthma without complication   2. Gastroesophageal reflux disease without esophagitis   3. Other allergic rhinitis   4. Current use of beta blocker   5. Ventricular tachycardia ([place])   6. Vocal cord dysfunction   7. Deviated nasal septum     Meds ordered this encounter  Medications  . levalbuterol (XOPENEX HFA) 45 MCG/ACT inhaler    Sig: Inhale 2 puffs into the lungs every 6 (six) hours as needed for wheezing.    Dispense:  1 Inhaler    Refill:  5    Patient Instructions  Xopenex 2 puffs every 6 hours if needed for coughing or wheezing Guaifenesin [phone]  mg twice a day ( Mucinex) Rhinocort 1 spray per nostril  twice a day after doing nasal irrigations Robitussin-DM for cough You need to completely control gastroesophageal reflux Monitor your blood pressure and make sure that it falls . I would not use large amounts of beta blockers. Your shortness of breath today is not related to asthma   Return in about 1 year (around 07/31/2017).    Thank you for the opportunity to care for this patient.  Please do not hesitate to contact me with questions.  Penne Lash, M.D.  [facility] [address] [place], [place] [address] [phone]

## 2016-07-31 NOTE — Patient Instructions (Addendum)
Xopenex 2 puffs every 6 hours if needed for coughing or wheezing Guaifenesin [phone]  mg twice a day ( Mucinex) Rhinocort 1 spray per nostril twice a day after doing nasal irrigations Robitussin-DM for cough You need to completely control gastroesophageal reflux Monitor your blood pressure and make sure that it falls . I would not use large amounts of beta blockers. Your shortness of breath today is not related to asthma

## 2016-08-02 DIAGNOSIS — R1011 Right upper quadrant pain: Secondary | ICD-10-CM | POA: Diagnosis not present

## 2016-08-03 DIAGNOSIS — J018 Other acute sinusitis: Secondary | ICD-10-CM | POA: Diagnosis not present

## 2016-08-03 DIAGNOSIS — R1310 Dysphagia, unspecified: Secondary | ICD-10-CM | POA: Diagnosis not present

## 2016-08-03 DIAGNOSIS — J328 Other chronic sinusitis: Secondary | ICD-10-CM | POA: Diagnosis not present

## 2016-08-03 DIAGNOSIS — B370 Candidal stomatitis: Secondary | ICD-10-CM | POA: Diagnosis not present

## 2016-08-03 DIAGNOSIS — R490 Dysphonia: Secondary | ICD-10-CM | POA: Diagnosis not present

## 2016-08-07 DIAGNOSIS — G35 Multiple sclerosis: Secondary | ICD-10-CM | POA: Diagnosis not present

## 2016-08-07 DIAGNOSIS — I499 Cardiac arrhythmia, unspecified: Secondary | ICD-10-CM | POA: Diagnosis not present

## 2016-08-07 DIAGNOSIS — K449 Diaphragmatic hernia without obstruction or gangrene: Secondary | ICD-10-CM | POA: Diagnosis not present

## 2016-08-07 DIAGNOSIS — Z886 Allergy status to analgesic agent status: Secondary | ICD-10-CM | POA: Diagnosis not present

## 2016-08-07 DIAGNOSIS — M25561 Pain in right knee: Secondary | ICD-10-CM | POA: Diagnosis not present

## 2016-08-07 DIAGNOSIS — D509 Iron deficiency anemia, unspecified: Secondary | ICD-10-CM | POA: Diagnosis not present

## 2016-08-07 DIAGNOSIS — E46 Unspecified protein-calorie malnutrition: Secondary | ICD-10-CM | POA: Diagnosis not present

## 2016-08-07 DIAGNOSIS — Z885 Allergy status to narcotic agent status: Secondary | ICD-10-CM | POA: Diagnosis not present

## 2016-08-07 DIAGNOSIS — G8929 Other chronic pain: Secondary | ICD-10-CM | POA: Diagnosis not present

## 2016-08-07 DIAGNOSIS — Z8249 Family history of ischemic heart disease and other diseases of the circulatory system: Secondary | ICD-10-CM | POA: Diagnosis not present

## 2016-08-07 DIAGNOSIS — Z87891 Personal history of nicotine dependence: Secondary | ICD-10-CM | POA: Diagnosis not present

## 2016-08-07 DIAGNOSIS — Z888 Allergy status to other drugs, medicaments and biological substances status: Secondary | ICD-10-CM | POA: Diagnosis not present

## 2016-08-07 DIAGNOSIS — M222X1 Patellofemoral disorders, right knee: Secondary | ICD-10-CM | POA: Diagnosis not present

## 2016-08-07 DIAGNOSIS — E785 Hyperlipidemia, unspecified: Secondary | ICD-10-CM | POA: Diagnosis not present

## 2016-08-07 DIAGNOSIS — K5790 Diverticulosis of intestine, part unspecified, without perforation or abscess without bleeding: Secondary | ICD-10-CM | POA: Diagnosis not present

## 2016-08-07 DIAGNOSIS — I10 Essential (primary) hypertension: Secondary | ICD-10-CM | POA: Diagnosis not present

## 2016-08-07 DIAGNOSIS — Z9049 Acquired absence of other specified parts of digestive tract: Secondary | ICD-10-CM | POA: Diagnosis not present

## 2016-08-07 DIAGNOSIS — G4730 Sleep apnea, unspecified: Secondary | ICD-10-CM | POA: Diagnosis not present

## 2016-08-07 DIAGNOSIS — Z87442 Personal history of urinary calculi: Secondary | ICD-10-CM | POA: Diagnosis not present

## 2016-08-07 DIAGNOSIS — M79604 Pain in right leg: Secondary | ICD-10-CM | POA: Diagnosis not present

## 2016-08-07 DIAGNOSIS — C4490 Unspecified malignant neoplasm of skin, unspecified: Secondary | ICD-10-CM | POA: Diagnosis not present

## 2016-08-07 DIAGNOSIS — J45909 Unspecified asthma, uncomplicated: Secondary | ICD-10-CM | POA: Diagnosis not present

## 2016-08-08 ENCOUNTER — Encounter: Payer: Self-pay | Admitting: Family Medicine

## 2016-08-08 ENCOUNTER — Telehealth: Payer: Self-pay | Admitting: Sports Medicine

## 2016-08-08 DIAGNOSIS — M79604 Pain in right leg: Secondary | ICD-10-CM | POA: Diagnosis not present

## 2016-08-08 NOTE — Telephone Encounter (Signed)
Patient called clinic today requesting an MRI on her right knee. Pt reports her "knee hurts" and went to [facility] ED last night for it. They did an xray, results in [place]. Pt has not done therapy or conservative measures at this point. Unsure of ROM limitations, does report pain is above her patella.  Advised Pt we would need to evaluate her knee to determine if MRI is appropriate. Pt does have appt this week with PCP. Offered appt with Dr. Dianah Field, denied stating she just needs the order. Pt would like MRI to be done at [facility]. Pt advised to wear her knee brace, elevate, and alternate using ice/heat until upcoming appt. Will route for review.

## 2016-08-09 ENCOUNTER — Telehealth: Payer: Self-pay | Admitting: *Deleted

## 2016-08-09 ENCOUNTER — Encounter: Payer: Self-pay | Admitting: *Deleted

## 2016-08-09 NOTE — Telephone Encounter (Signed)
Pt advised of recommendations, appt made for next week with Dr. Darene Lamer.

## 2016-08-09 NOTE — Telephone Encounter (Signed)
Called pt regarding appt 1/3 0815- notified pt MD will not be in office at [place] and we would like to move appt time only to 9am. Pt upset states she is depending on her sons girlfriend for transportation and may not be able to come at 9am.informed pt she can still come at [place] however she will not see MD until 9am. Pt verbalized she will call her ride and hopefully she will still make it. No further concerns.

## 2016-08-09 NOTE — Telephone Encounter (Signed)
Before we order an MRI and need to make sure to the appropriate next step. She should either see myself or Dr. Georgina Snell.

## 2016-08-09 NOTE — Progress Notes (Signed)
[facility]  Clinical Social Work received call from [employer], applications, patient reported she does not have transportation to get to upcoming appointment.  CSW attempted to contact patient, left message on patient's voicemail to return call.   Polo Riley, MSW, LCSW, OSW-C Clinical Social Worker [facility] [phone]

## 2016-08-10 ENCOUNTER — Encounter: Payer: Self-pay | Admitting: Family Medicine

## 2016-08-10 ENCOUNTER — Ambulatory Visit (INDEPENDENT_AMBULATORY_CARE_PROVIDER_SITE_OTHER): Payer: Medicare Other | Admitting: Family Medicine

## 2016-08-10 VITALS — BP 139/86 | HR 91 | Ht 62.0 in | Wt 206.0 lb

## 2016-08-10 DIAGNOSIS — R7301 Impaired fasting glucose: Secondary | ICD-10-CM | POA: Diagnosis not present

## 2016-08-10 DIAGNOSIS — R935 Abnormal findings on diagnostic imaging of other abdominal regions, including retroperitoneum: Secondary | ICD-10-CM | POA: Diagnosis not present

## 2016-08-10 DIAGNOSIS — M25561 Pain in right knee: Secondary | ICD-10-CM | POA: Diagnosis not present

## 2016-08-10 DIAGNOSIS — N281 Cyst of kidney, acquired: Secondary | ICD-10-CM | POA: Diagnosis not present

## 2016-08-10 DIAGNOSIS — R1011 Right upper quadrant pain: Secondary | ICD-10-CM | POA: Diagnosis not present

## 2016-08-10 DIAGNOSIS — I10 Essential (primary) hypertension: Secondary | ICD-10-CM

## 2016-08-10 DIAGNOSIS — D1803 Hemangioma of intra-abdominal structures: Secondary | ICD-10-CM | POA: Diagnosis not present

## 2016-08-10 LAB — POCT GLYCOSYLATED HEMOGLOBIN (HGB A1C): Hemoglobin A1C: 6.1

## 2016-08-10 NOTE — Progress Notes (Signed)
Subjective:    CC: right knee pain  HPI:  Right knee worse 1-1/2 weeks. She has had problems on and off with her right knee. In fact she had an MRI in February 2015 done at [facility] which showed some prepatellar bursitis as well as some arthritis behind the patella. But over the last week and a half has been worse. It's been very stiff and 8. She feels like she's unable to fully flex. She denies any recent injury or trauma. It's actually been swelling a lot more.  She has been using a cane.    Hypertension- Pt denies chest pain, SOB, dizziness, or heart palpitations.  Taking meds as directed w/o problems.  Denies medication side effects.    She does have a follow-up appointment scheduled with Dr. Ethel Rana, her gastric neurologist to reexamine her liver. She still been having some intermittent right upper quadrant pain.  Impaired fasting glucose- no inc thirst or urination.    Past medical history, Surgical history, Family history not pertinant except as noted below, Social history, Allergies, and medications have been entered into the medical record, reviewed, and corrections made.   Review of Systems: No fevers, chills, night sweats, weight loss, chest pain, or shortness of breath.   Objective:    General: Well Developed, well nourished, and in no acute distress.  Neuro: Alert and oriented x3, extra-ocular muscles intact, sensation grossly intact.  HEENT: Normocephalic, atraumatic  Skin: Warm and dry, no rashes. Cardiac: Regular rate and rhythm, no murmurs rubs or gallops, no lower extremity edema.  Respiratory: Clear to auscultation bilaterally. Not using accessory muscles, speaking in full sentences.   Impression and Recommendations:    Right knee pain- will get MRI. Can use heat or ice PRN.  REst.  Will call with results once available.   IFG - A1c up to 6.1.  Up from last time. Work on diet and exercise. Discussed cutting out sugary beverages and watching carb intake.     RUQ pain - seeing [name].    She had an MRI done. Awaiting results.    HTN - improved on recheck today.

## 2016-08-12 DIAGNOSIS — M25561 Pain in right knee: Secondary | ICD-10-CM | POA: Diagnosis not present

## 2016-08-15 ENCOUNTER — Encounter (HOSPITAL_BASED_OUTPATIENT_CLINIC_OR_DEPARTMENT_OTHER): Payer: Self-pay | Admitting: *Deleted

## 2016-08-15 ENCOUNTER — Telehealth: Payer: Self-pay | Admitting: Sports Medicine

## 2016-08-15 ENCOUNTER — Emergency Department (HOSPITAL_BASED_OUTPATIENT_CLINIC_OR_DEPARTMENT_OTHER)
Admission: EM | Admit: 2016-08-15 | Discharge: 2016-08-15 | Disposition: A | Discharge status: Home / Self Care | Payer: Medicare Other | Attending: Physician Assistant | Admitting: Physician Assistant

## 2016-08-15 ENCOUNTER — Emergency Department (HOSPITAL_BASED_OUTPATIENT_CLINIC_OR_DEPARTMENT_OTHER): Payer: Medicare Other

## 2016-08-15 ENCOUNTER — Ambulatory Visit: Payer: Self-pay | Admitting: Sports Medicine

## 2016-08-15 DIAGNOSIS — J45909 Unspecified asthma, uncomplicated: Secondary | ICD-10-CM | POA: Insufficient documentation

## 2016-08-15 DIAGNOSIS — Z79899 Other long term (current) drug therapy: Secondary | ICD-10-CM | POA: Insufficient documentation

## 2016-08-15 DIAGNOSIS — Z87891 Personal history of nicotine dependence: Secondary | ICD-10-CM | POA: Insufficient documentation

## 2016-08-15 DIAGNOSIS — R55 Syncope and collapse: Secondary | ICD-10-CM | POA: Diagnosis not present

## 2016-08-15 DIAGNOSIS — I10 Essential (primary) hypertension: Secondary | ICD-10-CM | POA: Diagnosis not present

## 2016-08-15 DIAGNOSIS — Z8542 Personal history of malignant neoplasm of other parts of uterus: Secondary | ICD-10-CM | POA: Insufficient documentation

## 2016-08-15 DIAGNOSIS — R109 Unspecified abdominal pain: Secondary | ICD-10-CM | POA: Insufficient documentation

## 2016-08-15 DIAGNOSIS — R079 Chest pain, unspecified: Secondary | ICD-10-CM | POA: Diagnosis not present

## 2016-08-15 DIAGNOSIS — R42 Dizziness and giddiness: Secondary | ICD-10-CM | POA: Diagnosis not present

## 2016-08-15 DIAGNOSIS — E876 Hypokalemia: Secondary | ICD-10-CM | POA: Diagnosis not present

## 2016-08-15 DIAGNOSIS — M1711 Unilateral primary osteoarthritis, right knee: Secondary | ICD-10-CM

## 2016-08-15 LAB — BASIC METABOLIC PANEL
Anion gap: 9 (ref 5–15)
BUN: 13 mg/dL (ref 6–20)
BUN: 14 mg/dL (ref 4–21)
CO2: 21 mmol/L — ABNORMAL LOW (ref 22–32)
Calcium: 9.1 mg/dL (ref 8.9–10.3)
Chloride: 107 mmol/L (ref 101–111)
Creatinine, Ser: 0.44 mg/dL (ref 0.44–1.00)
Creatinine: 0.6 mg/dL (ref 0.5–1.1)
GFR calc Af Amer: >60 mL/min (ref >60)
GFR calc non Af Amer: >60 mL/min (ref >60)
Glucose, Bld: 93 mg/dL (ref 65–99)
Glucose: 125 mg/dL
Potassium: 3.7 mmol/L (ref 3.5–5.1)
Potassium: 3.9 mmol/L (ref 3.4–5.3)
Sodium: 137 mmol/L (ref 135–145)
Sodium: 141 mmol/L (ref 137–147)

## 2016-08-15 LAB — CBC
HCT: 40.7 % (ref 36.0–46.0)
Hemoglobin: 13.4 g/dL (ref 12.0–15.0)
MCH: 29.6 pg (ref 26.0–34.0)
MCHC: 32.9 g/dL (ref 30.0–36.0)
MCV: 89.8 fL (ref 78.0–100.0)
Platelets: 223 10*3/uL (ref 150–400)
RBC: 4.53 MIL/uL (ref 3.87–5.11)
RDW: 13.5 % (ref 11.5–15.5)
WBC: 6.3 10*3/uL (ref 4.0–10.5)

## 2016-08-15 LAB — TROPONIN I: Troponin I: <0.03 ng/mL (ref <0.03)

## 2016-08-15 NOTE — Telephone Encounter (Signed)
MRI simply shows a tiny inferior articular surface tear in the posterior medial corner of the medial meniscus, there is chondral thinning in multiple other locations and some fluid around the semimembranosus.  At this point her diagnosis is still just osteoarthritis, and she is now a candidate for viscous supplementation, please work on approval.
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
## 2016-08-15 NOTE — ED Triage Notes (Addendum)
Lightheaded. She drove herself here. She is laughing and very talkative at triage without SOB.

## 2016-08-15 NOTE — Telephone Encounter (Signed)
Pt would like MRI results from recent knee MRI. She would also like recommendations, and if she has any restrictions. Routing.

## 2016-08-15 NOTE — Telephone Encounter (Signed)
Pt advised of results. Will submit OV approval.

## 2016-08-15 NOTE — Discharge Instructions (Signed)
You were seen today with presyncope. We are unsure what caused it. However you're labs physical exam and vital signs are safe right now. Please return with follow-up with your cardiologist, GI doctor, primary care physician.

## 2016-08-15 NOTE — ED Notes (Signed)
Asked pt to get into a gown, pt stated "You don't know me well enough, I don't get into gowns because I am allergic to them"

## 2016-08-16 ENCOUNTER — Telehealth: Payer: Self-pay | Admitting: Family Medicine

## 2016-08-16 ENCOUNTER — Encounter: Payer: Self-pay | Admitting: Gynecologic Oncology

## 2016-08-16 ENCOUNTER — Other Ambulatory Visit: Payer: Self-pay | Admitting: Gynecologic Oncology

## 2016-08-16 ENCOUNTER — Ambulatory Visit: Payer: Medicare Other | Attending: Gynecologic Oncology | Admitting: Gynecologic Oncology

## 2016-08-16 VITALS — BP 144/86 | HR 99 | Temp 98.5°F | Resp 18 | Ht 62.0 in | Wt 206.0 lb

## 2016-08-16 DIAGNOSIS — R102 Pelvic and perineal pain: Secondary | ICD-10-CM

## 2016-08-16 DIAGNOSIS — F4310 Post-traumatic stress disorder, unspecified: Secondary | ICD-10-CM | POA: Diagnosis not present

## 2016-08-16 DIAGNOSIS — Z8249 Family history of ischemic heart disease and other diseases of the circulatory system: Secondary | ICD-10-CM | POA: Insufficient documentation

## 2016-08-16 DIAGNOSIS — Z9049 Acquired absence of other specified parts of digestive tract: Secondary | ICD-10-CM | POA: Insufficient documentation

## 2016-08-16 DIAGNOSIS — Z833 Family history of diabetes mellitus: Secondary | ICD-10-CM | POA: Insufficient documentation

## 2016-08-16 DIAGNOSIS — K219 Gastro-esophageal reflux disease without esophagitis: Secondary | ICD-10-CM | POA: Insufficient documentation

## 2016-08-16 DIAGNOSIS — Z803 Family history of malignant neoplasm of breast: Secondary | ICD-10-CM | POA: Insufficient documentation

## 2016-08-16 DIAGNOSIS — J45909 Unspecified asthma, uncomplicated: Secondary | ICD-10-CM | POA: Diagnosis not present

## 2016-08-16 DIAGNOSIS — Z801 Family history of malignant neoplasm of trachea, bronchus and lung: Secondary | ICD-10-CM | POA: Diagnosis not present

## 2016-08-16 DIAGNOSIS — F329 Major depressive disorder, single episode, unspecified: Secondary | ICD-10-CM | POA: Insufficient documentation

## 2016-08-16 DIAGNOSIS — C541 Malignant neoplasm of endometrium: Secondary | ICD-10-CM | POA: Diagnosis not present

## 2016-08-16 DIAGNOSIS — N899 Noninflammatory disorder of vagina, unspecified: Secondary | ICD-10-CM | POA: Diagnosis not present

## 2016-08-16 DIAGNOSIS — Z880 Allergy status to penicillin: Secondary | ICD-10-CM | POA: Insufficient documentation

## 2016-08-16 DIAGNOSIS — Z8542 Personal history of malignant neoplasm of other parts of uterus: Secondary | ICD-10-CM

## 2016-08-16 DIAGNOSIS — Z811 Family history of alcohol abuse and dependence: Secondary | ICD-10-CM | POA: Diagnosis not present

## 2016-08-16 DIAGNOSIS — Z79899 Other long term (current) drug therapy: Secondary | ICD-10-CM | POA: Insufficient documentation

## 2016-08-16 DIAGNOSIS — Z87891 Personal history of nicotine dependence: Secondary | ICD-10-CM | POA: Insufficient documentation

## 2016-08-16 DIAGNOSIS — I10 Essential (primary) hypertension: Secondary | ICD-10-CM | POA: Insufficient documentation

## 2016-08-16 DIAGNOSIS — Z888 Allergy status to other drugs, medicaments and biological substances status: Secondary | ICD-10-CM | POA: Insufficient documentation

## 2016-08-16 DIAGNOSIS — E559 Vitamin D deficiency, unspecified: Secondary | ICD-10-CM | POA: Diagnosis not present

## 2016-08-16 DIAGNOSIS — Z818 Family history of other mental and behavioral disorders: Secondary | ICD-10-CM | POA: Insufficient documentation

## 2016-08-16 DIAGNOSIS — Z9889 Other specified postprocedural states: Secondary | ICD-10-CM | POA: Diagnosis not present

## 2016-08-16 DIAGNOSIS — E785 Hyperlipidemia, unspecified: Secondary | ICD-10-CM | POA: Diagnosis not present

## 2016-08-16 DIAGNOSIS — G4730 Sleep apnea, unspecified: Secondary | ICD-10-CM | POA: Insufficient documentation

## 2016-08-16 MED ORDER — ESTRADIOL 10 MCG VA TABS: 10.0000 ug | ORAL_TABLET | VAGINAL | 11 refills | Status: DC
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
## 2016-08-16 NOTE — Patient Instructions (Addendum)
We will see you back in our office in approximately 6 months. Please call any time if you have questions or concerns prior to your appointment.   Atrophic Vaginitis Introduction Atrophic vaginitis is when the tissues that line the vagina become dry and thin. This is caused by a drop in estrogen. Estrogen helps:  To keep the vagina moist.  To make a clear fluid that helps:  To lubricate the vagina for sex.  To protect the vagina from infection. If the lining of the vagina is dry and thin, it may:  Make sex painful. It may also cause bleeding.  Cause a feeling of:  Burning.  Irritation.  Itchiness.  Make an exam of your vagina painful. It may also cause bleeding.  Make you lose interest in sex.  Cause a burning feeling when you pee.  Make your vaginal fluid (discharge) brown or yellow. For some women, there are no symptoms. This condition is most common in women who do not get their regular menstrual periods anymore (menopause). This often starts when a woman is 87-51 years old. Follow these instructions at home:  Take medicines only as told by your doctor. Do not use any herbal or alternative medicines unless your doctor says it is okay.  Use over-the-counter products for dryness only as told by your doctor. These include:  Creams.  Lubricants.  Moisturizers.  Do not douche.  Do not use products that can make your vagina dry. These include:  Scented feminine sprays.  Scented tampons.  Scented soaps.  If it hurts to have sex, tell your sexual partner. Contact a doctor if:  Your discharge looks different than normal.  Your vagina has an unusual smell.  You have new symptoms.  Your symptoms do not get better with treatment.  Your symptoms get worse. This information is not intended to replace advice given to you by your health care provider. Make sure you discuss any questions you have with your health care provider. Document Released: 01/17/2008  Document Revised: 01/06/2016 Document Reviewed: 07/22/2014  2017 Elsevier

## 2016-08-16 NOTE — Telephone Encounter (Signed)
Call patient: I did receive her lab results. Vitamin D is 25 which is in the normal range but I would continue her extra supplement. Especially through the winter. Beatrice Lecher, MD

## 2016-08-17 DIAGNOSIS — I471 Supraventricular tachycardia: Secondary | ICD-10-CM | POA: Diagnosis not present

## 2016-08-17 DIAGNOSIS — E269 Hyperaldosteronism, unspecified: Secondary | ICD-10-CM | POA: Diagnosis not present

## 2016-08-17 DIAGNOSIS — I472 Ventricular tachycardia: Secondary | ICD-10-CM | POA: Diagnosis not present

## 2016-08-17 DIAGNOSIS — I10 Essential (primary) hypertension: Secondary | ICD-10-CM | POA: Diagnosis not present

## 2016-08-17 MED ORDER — ESTRADIOL 10 MCG VA TABS: 10.0000 ug | ORAL_TABLET | VAGINAL | 11 refills | Status: DC

## 2016-08-17 NOTE — Telephone Encounter (Signed)
Pt informed of results and given recommendations. She stated that she is not currently taking any supplements and cannot afford it at this time but will p/u when she can.Jennifer [name]   Pt also wanted to know if Reubin Milan has heard anything regarding the approval for the medication that Dr. Darene Lamer wanted her to start. I told her that I would have Kelsi to contact her regarding this.Elouise Munroe '

## 2016-08-17 NOTE — Addendum Note (Signed)
Addended by: Joylene John D on: 08/17/2016 12:08 PM   Modules accepted: Orders

## 2016-08-21 NOTE — Telephone Encounter (Signed)
Received the following information from OV benefits investigation:   Correct Policy # [id number]. Patient has Medicare Replacement HMO plan with an effective date of 10/13/2015. Submit PCP referral for initial visit with claim. [id number] is covered at 80% & [id number] is covered at 100% of the contracted rate when performed in an office setting. $50.00 Copay for Specialist Office visit applies. *Deductible does not apply. If Out of Pocket is met, coverage goes to 100% & Copay will no longer apply. As per rep Dr Clearence Ped is showing to be out of network with this policy, although there is no out of network benefits for this plan. Above benefits reflect in network level coverage only. Reference: [id number]   Called and discussed this info with Pt, she cannot afford injections. Will discuss alternative options at upcoming [place].

## 2016-08-21 NOTE — Telephone Encounter (Signed)
Called and discussed this info with Pt, she cannot afford injections. Will discuss alternative options at upcoming [place].

## 2016-08-21 NOTE — Telephone Encounter (Signed)
Submitted for approval on Orthovisc. Awaiting confirmation.  

## 2016-08-22 DIAGNOSIS — R079 Chest pain, unspecified: Secondary | ICD-10-CM | POA: Diagnosis not present

## 2016-08-22 DIAGNOSIS — I10 Essential (primary) hypertension: Secondary | ICD-10-CM | POA: Diagnosis not present

## 2016-08-22 DIAGNOSIS — R05 Cough: Secondary | ICD-10-CM | POA: Diagnosis not present

## 2016-08-22 DIAGNOSIS — R0781 Pleurodynia: Secondary | ICD-10-CM | POA: Diagnosis not present

## 2016-08-22 DIAGNOSIS — R0600 Dyspnea, unspecified: Secondary | ICD-10-CM | POA: Diagnosis not present

## 2016-08-22 DIAGNOSIS — R0602 Shortness of breath: Secondary | ICD-10-CM | POA: Diagnosis not present

## 2016-08-22 DIAGNOSIS — Z79899 Other long term (current) drug therapy: Secondary | ICD-10-CM | POA: Diagnosis not present

## 2016-08-24 ENCOUNTER — Ambulatory Visit (INDEPENDENT_AMBULATORY_CARE_PROVIDER_SITE_OTHER): Payer: Medicare Other | Admitting: Family Medicine

## 2016-08-24 ENCOUNTER — Ambulatory Visit (INDEPENDENT_AMBULATORY_CARE_PROVIDER_SITE_OTHER): Payer: Medicare Other | Admitting: Sports Medicine

## 2016-08-24 ENCOUNTER — Encounter: Payer: Self-pay | Admitting: Sports Medicine

## 2016-08-24 ENCOUNTER — Encounter: Payer: Self-pay | Admitting: Family Medicine

## 2016-08-24 VITALS — BP 121/77 | HR 101 | Ht 62.0 in | Wt 207.0 lb

## 2016-08-24 DIAGNOSIS — I10 Essential (primary) hypertension: Secondary | ICD-10-CM

## 2016-08-24 DIAGNOSIS — L298 Other pruritus: Secondary | ICD-10-CM | POA: Diagnosis not present

## 2016-08-24 DIAGNOSIS — M2550 Pain in unspecified joint: Secondary | ICD-10-CM | POA: Diagnosis not present

## 2016-08-24 DIAGNOSIS — M797 Fibromyalgia: Secondary | ICD-10-CM

## 2016-08-24 DIAGNOSIS — M1711 Unilateral primary osteoarthritis, right knee: Secondary | ICD-10-CM

## 2016-08-24 MED ORDER — DICLOFENAC SODIUM 1 % TD GEL: 4.0000 g | Freq: Four times a day (QID) | TRANSDERMAL | 99 refills | Status: DC

## 2016-08-24 MED ORDER — TRIAMCINOLONE ACETONIDE 0.5 % EX OINT: 1.0000 "application " | TOPICAL_OINTMENT | Freq: Every day | CUTANEOUS | 0 refills | Status: DC | PRN

## 2016-08-24 NOTE — Assessment & Plan Note (Signed)
In the past MRI simply showed expected mild degenerative changes, symptoms are predominantly patellofemoral, hip abductor's are weak on the right. She will do some ibuprofen and aggressive hip abductor rehabilitation exercises. Return in 4 weeks to recheck hip strength and knee pain. Patients highly resistant to any form of injection.

## 2016-08-24 NOTE — Progress Notes (Signed)
Subjective:    CC:   HPI:  HTN - She is now on her TM and has been weaning off of the labetalol. I believe today's her last day on the medication. She's been feeling fatigued and overall just not feeling well. In fact she went to the emergency department recently for shortness of breath. She is having  She also reports that she's been getting some blood out of her sinuses for the last week. No fevers chills or sweats. She's been getting some yellow mucous mucus. She mentioned that at the emergency department. At the time they felt like it was just a cold.  She's also had a few red bumps on her right abdomen. She noticed it was very itchy last night and saw the bumps. She says she tries to use cocoa butter to moisturize. She's not using any additional cream on it. She hasn't changed her detergent soaps etc.  He also complains that she's having a lot of joint pain today. Particularly in her right knee and her left shoulder and left anterior chest wall.  Past medical history, Surgical history, Family history not pertinant except as noted below, Social history, Allergies, and medications have been entered into the medical record, reviewed, and corrections made.   Review of Systems: No fevers, chills, night sweats, weight loss, chest pain, or shortness of breath.   Objective:    General: Well Developed, well nourished, and in no acute distress.  Neuro: Alert and oriented x3, extra-ocular muscles intact, sensation grossly intact.  HEENT: Normocephalic, atraumatic  Skin: Warm and dry. She has had a few erythematous fine papules on the right lower abdomen. They are excoriated. In the skin looks a little bit more dry in this area. Cardiac: Regular rate and rhythm, no murmurs rubs or gallops, no lower extremity edema.  Respiratory: Clear to auscultation bilaterally. Not using accessory muscles, speaking in full sentences. Musculoskeletal: She is tender over both upper arms above the elbow and over  the forearms bilaterally. She has some tenderness on the right upper chest. She has tenderness over the upper back on the right and left. She has tenderness over both upper thighs.   Impression and Recommendations:   Hypertension-blood pressure looks fantastic today.  Winter itch-we'll treat with moisturizing and a topical steroid.   Polyarthralgia. Her knees are particularly bothering her today especially her right knee. Recommend a trial of a topical NSAID since she really doesn't want to take oral NSAIDs.  Fibromyalgia Once again I discussed with her that I really think she has a diagnosis of fibromyalgia. I gave her handouts a complete at home to go through an marker areas of discomfort and symptoms. I'll have her bring it back when she comes back in in 2 weeks and we can go over together. Discussed that regular exercise can be very helpful to control symptoms. As long as she doesn't go to the gym and overdue it. Rated mild exercise especially initially is very helpful. We can certainly discuss other treatment options. Will hold off on any medication adjustments or additions today since she recently just went to the [place]. I want to make sure that she tolerates it well and there is no concern about interacting side effects.

## 2016-08-24 NOTE — Patient Instructions (Signed)
Hip Rehabilitation Protocol:  1.  Side leg raises.  3x30 with no weight, then 3x15 with 2 lb ankle weight, then 3x15 with 5 lb ankle weight 2.  Standing hip rotation.  3x30 with no weight, then 3x15 with 2 lb ankle weight, then 3x15 with 5 lb ankle weight. 3.  Side step ups.  3x30 with no weight, then 3x15 with 5 lbs in backpack, then 3x15 with 10 lbs in backpack. 

## 2016-08-24 NOTE — Progress Notes (Signed)
  Subjective:    CC: Right knee pain  HPI: This is a moderately pleasant 51 year old female, comes in with a long-standing history of right knee pain, we diagnosed her with hip osteoarthritis in the past, she had an injection about a year ago, having persistent pain. Really not taking much of her ibuprofen, pain is localized under the kneecap, moderate, persistent without radiation.  Past medical history:  Negative.  See flowsheet/record as well for more information.  Surgical history: Negative.  See flowsheet/record as well for more information.  Family history: Negative.  See flowsheet/record as well for more information.  Social history: Negative.  See flowsheet/record as well for more information.  Allergies, and medications have been entered into the medical record, reviewed, and no changes needed.   Review of Systems: No fevers, chills, night sweats, weight loss, chest pain, or shortness of breath.   Objective:    General: Well Developed, well nourished, and in no acute distress.  Neuro: Alert and oriented x3, extra-ocular muscles intact, sensation grossly intact.  HEENT: Normocephalic, atraumatic, pupils equal round reactive to light, neck supple, no masses, no lymphadenopathy, thyroid nonpalpable.  Skin: Warm and dry, no rashes. Cardiac: Regular rate and rhythm, no murmurs rubs or gallops, no lower extremity edema.  Respiratory: Clear to auscultation bilaterally. Not using accessory muscles, speaking in full sentences. Right Knee: Normal to inspection with no erythema or effusion or obvious bony abnormalities. Only minimal swelling, tender to palpation under the medial and lateral patellar facets and lesser so at the medial joint line ROM normal in flexion and extension and lower leg rotation. Ligaments with solid consistent endpoints including ACL, PCL, LCL, MCL. Negative Mcmurray's and provocative meniscal tests. Non painful patellar compression. Patellar and quadriceps  tendons unremarkable. Hamstring and quadriceps strength is normal. Hip abductor's are weak on the right side but not the left.  Impression and Recommendations:    Primary osteoarthritis of right knee In the past MRI simply showed expected mild degenerative changes, symptoms are predominantly patellofemoral, hip abductor's are weak on the right. She will do some ibuprofen and aggressive hip abductor rehabilitation exercises. Return in 4 weeks to recheck hip strength and knee pain. Patients highly resistant to any form of injection.

## 2016-08-27 IMAGING — US US EXTREM LOW*L* LIMITED
1 series · 14 of 25 positions shown · non-contrast
Comparison: Lower extremity ultrasound dated 09/07/2015.

CLINICAL DATA: Soft tissue lumps. Previously described lipoma was
within the right lower extremity.

EXAM:
ULTRASOUND LEFT LOWER EXTREMITY LIMITED
TECHNIQUE: Ultrasound examination of the lower extremity soft tissues was
performed in the area of clinical concern.

[Series 1: us extrem low*left* limited · 0.05mm/px · 30 acquisitions, 14 frames shown]
[im 1/30]
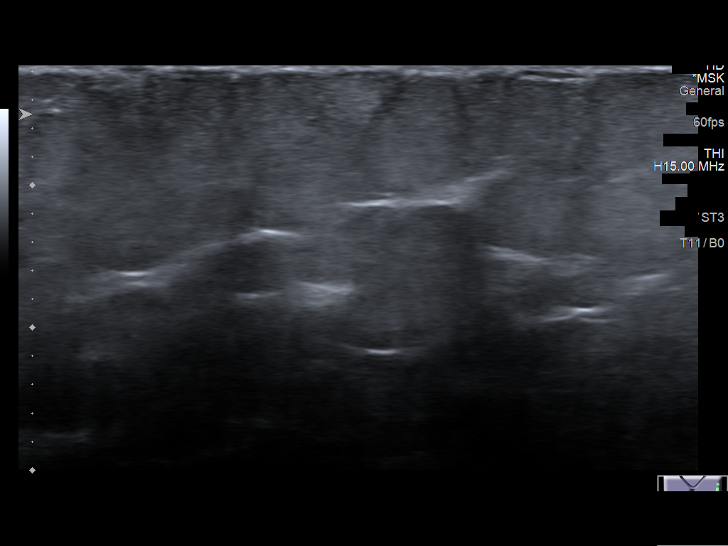
[im 3/30]
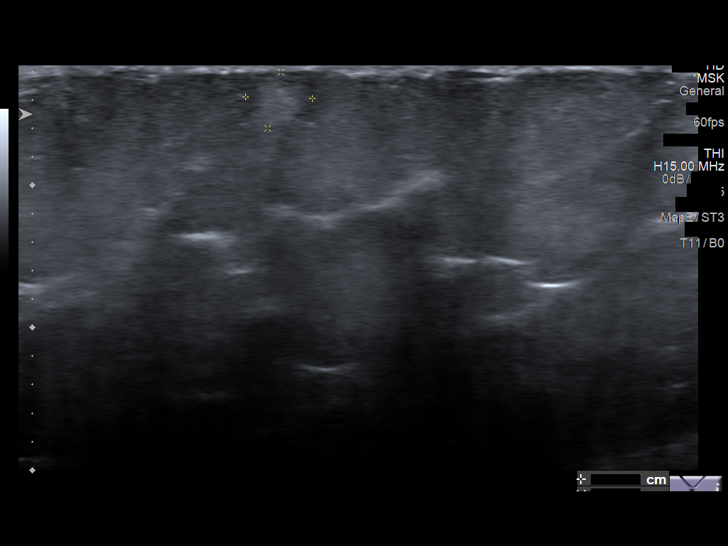
[im 5/30]
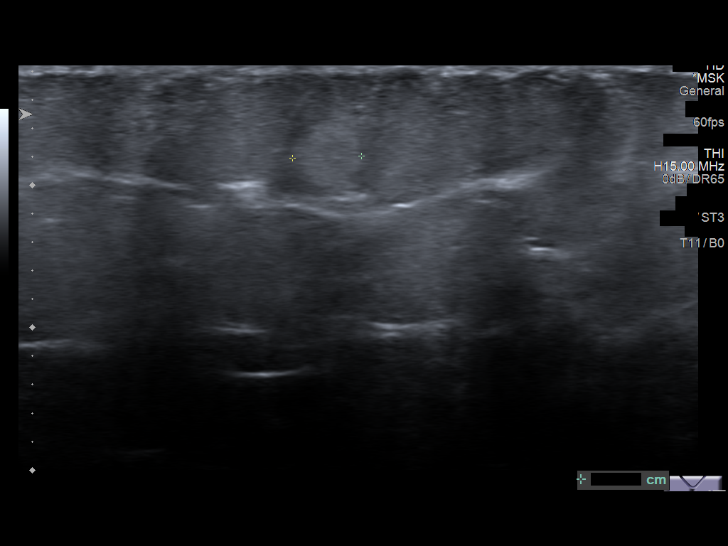
[im 8/30]
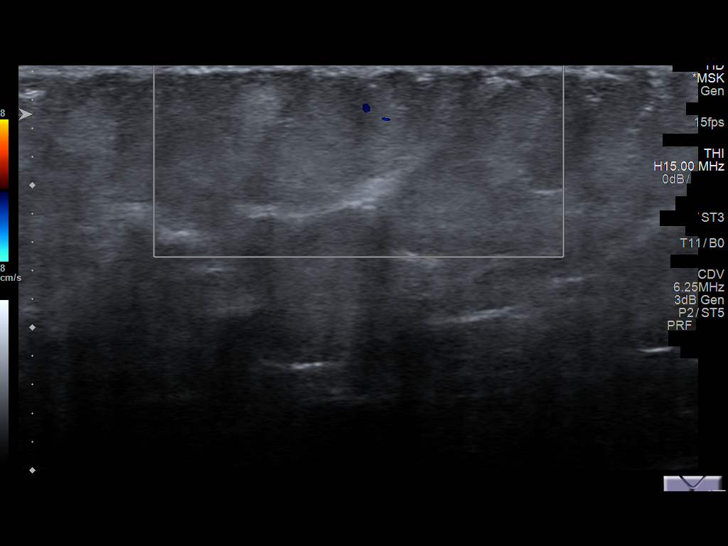
[im 10/30]
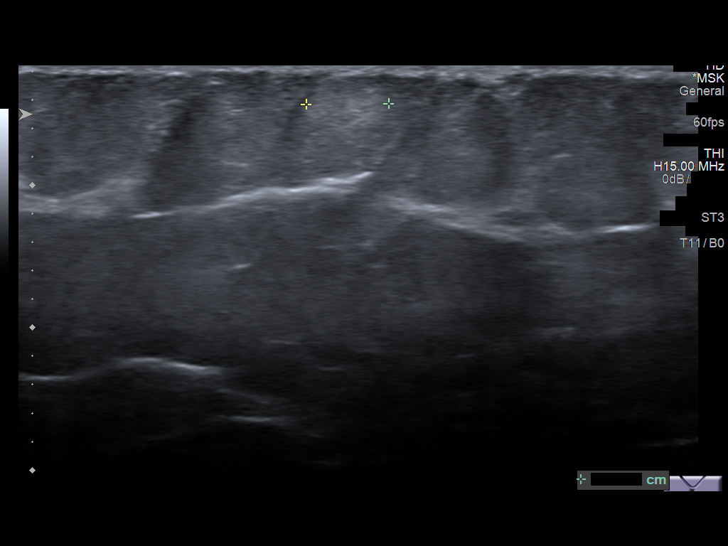
[im 11/30]
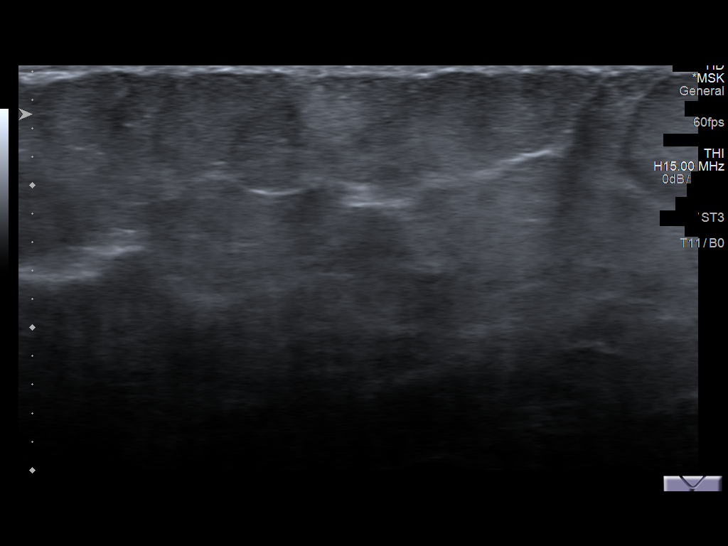
[im 14/30]
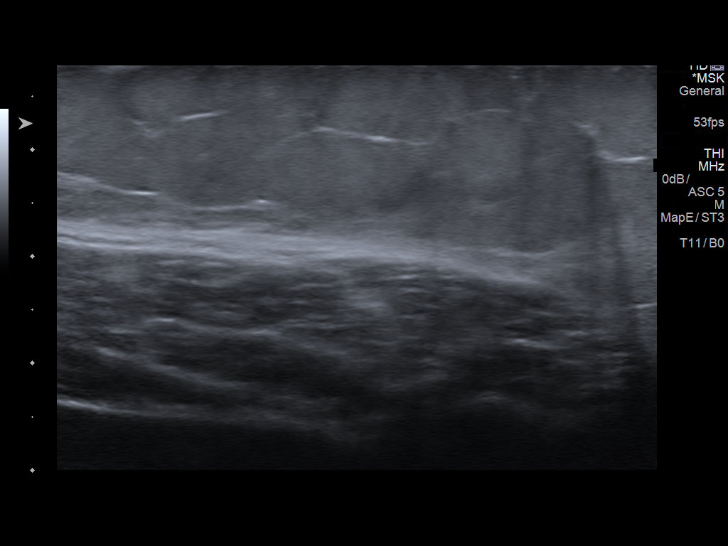
[im 16/30]
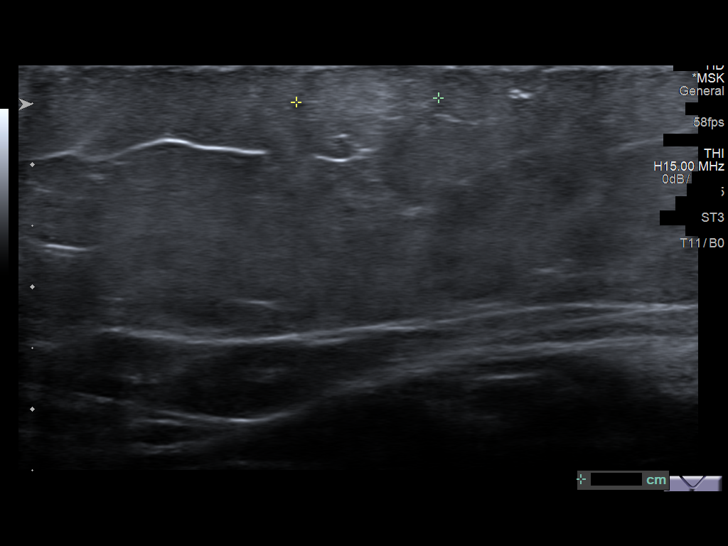
[im 19/30]
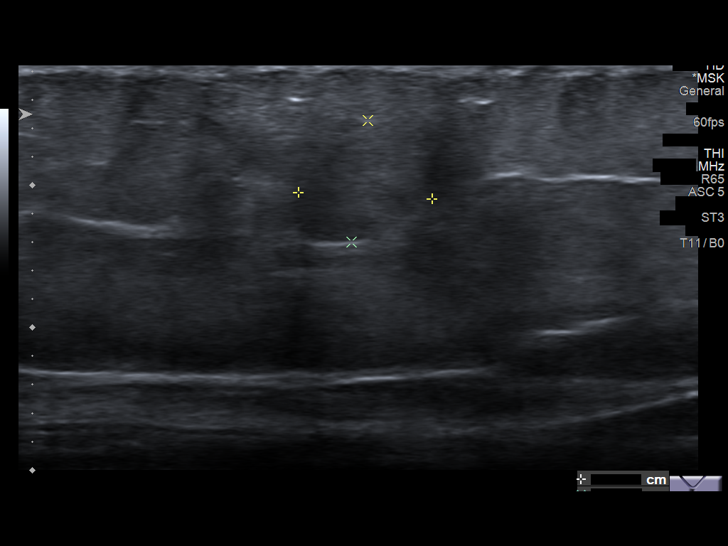
[im 20/30]
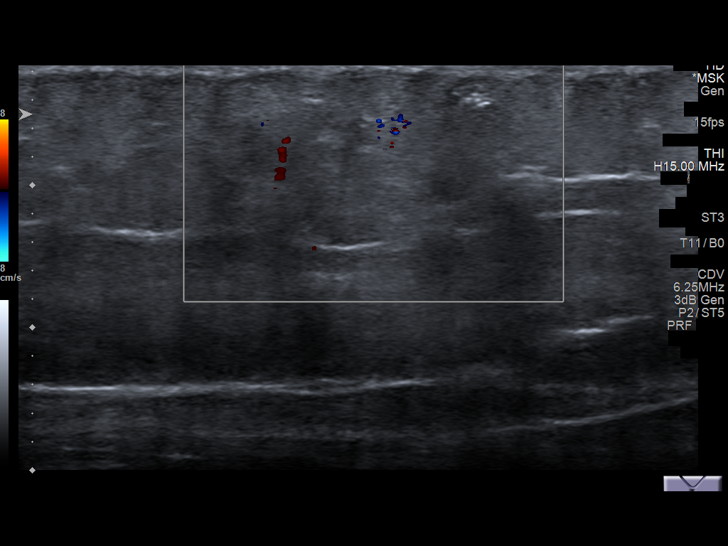
[im 22/30]
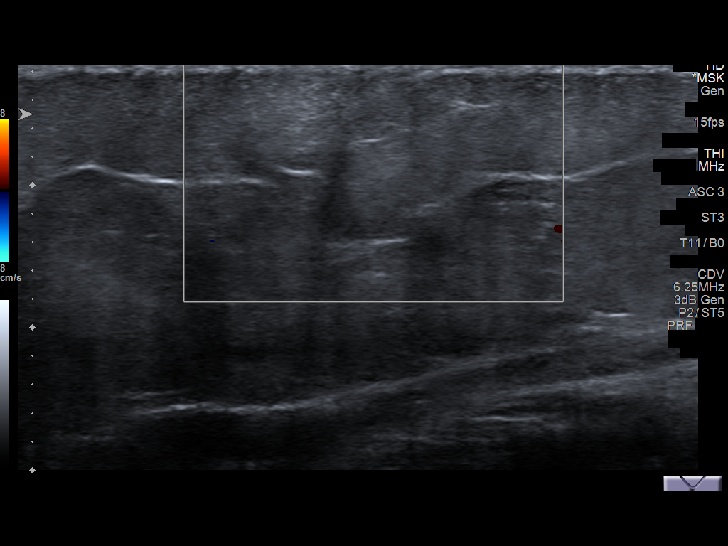
[im 25/30]
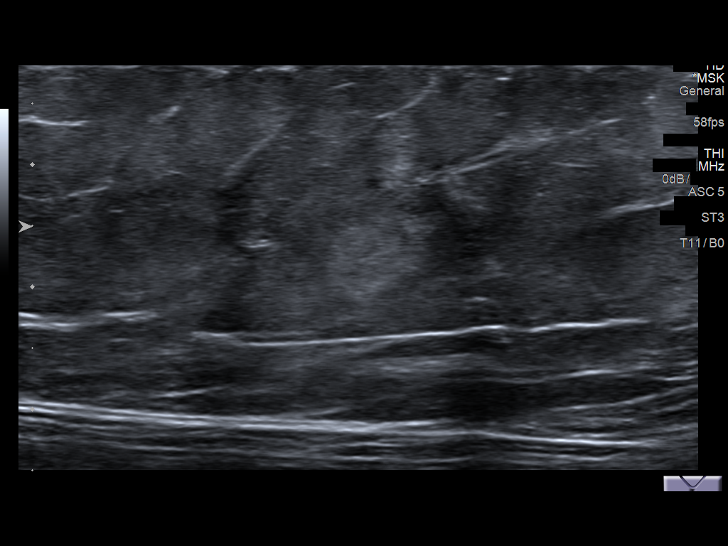
[im 27/30]
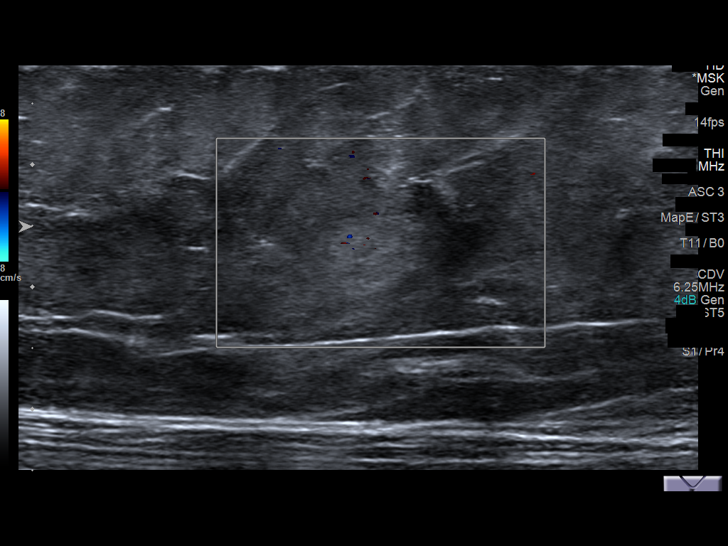
[im 30/30]
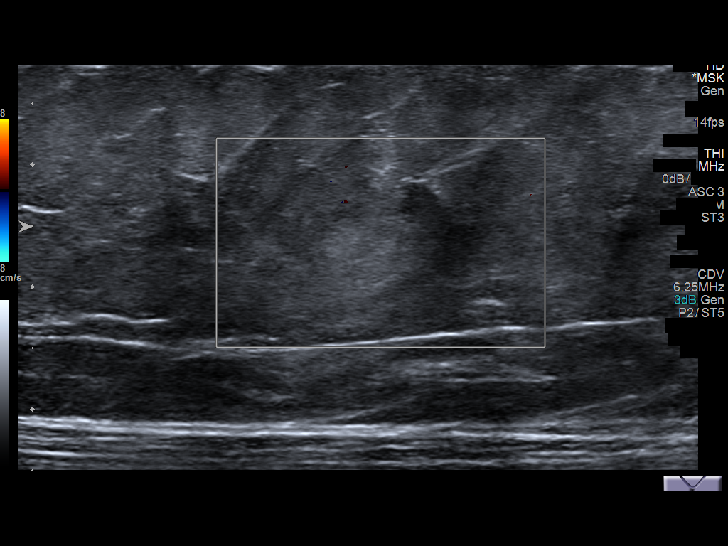

[14 of 25 positions shown; findings below may reference images not displayed]

FINDINGS: Scattered circumscribed hyperechoic masses are appreciated within
the subcutaneous soft tissues of the left thigh, avascular, all
compatible with benign lipomas, largest measuring approximately 1 cm
greatest dimension. No suspicious masses.
IMPRESSION: Multiple benign lipomas within the soft tissues of the left thigh,
largest measuring 1 cm.

## 2016-08-29 DIAGNOSIS — Z880 Allergy status to penicillin: Secondary | ICD-10-CM | POA: Diagnosis not present

## 2016-08-29 DIAGNOSIS — I10 Essential (primary) hypertension: Secondary | ICD-10-CM | POA: Diagnosis not present

## 2016-08-29 DIAGNOSIS — Z885 Allergy status to narcotic agent status: Secondary | ICD-10-CM | POA: Diagnosis not present

## 2016-08-29 DIAGNOSIS — Z9289 Personal history of other medical treatment: Secondary | ICD-10-CM | POA: Diagnosis not present

## 2016-08-29 DIAGNOSIS — Z881 Allergy status to other antibiotic agents status: Secondary | ICD-10-CM | POA: Diagnosis not present

## 2016-08-29 DIAGNOSIS — R200 Anesthesia of skin: Secondary | ICD-10-CM | POA: Diagnosis not present

## 2016-08-29 DIAGNOSIS — E785 Hyperlipidemia, unspecified: Secondary | ICD-10-CM | POA: Diagnosis not present

## 2016-08-29 DIAGNOSIS — R51 Headache: Secondary | ICD-10-CM | POA: Diagnosis not present

## 2016-08-29 DIAGNOSIS — G4730 Sleep apnea, unspecified: Secondary | ICD-10-CM | POA: Diagnosis not present

## 2016-08-29 DIAGNOSIS — Z886 Allergy status to analgesic agent status: Secondary | ICD-10-CM | POA: Diagnosis not present

## 2016-08-29 DIAGNOSIS — E2609 Other primary hyperaldosteronism: Secondary | ICD-10-CM | POA: Diagnosis not present

## 2016-08-29 DIAGNOSIS — J0110 Acute frontal sinusitis, unspecified: Secondary | ICD-10-CM | POA: Diagnosis not present

## 2016-08-29 DIAGNOSIS — H538 Other visual disturbances: Secondary | ICD-10-CM | POA: Diagnosis not present

## 2016-08-29 DIAGNOSIS — Z882 Allergy status to sulfonamides status: Secondary | ICD-10-CM | POA: Diagnosis not present

## 2016-08-29 DIAGNOSIS — Z8249 Family history of ischemic heart disease and other diseases of the circulatory system: Secondary | ICD-10-CM | POA: Diagnosis not present

## 2016-08-29 DIAGNOSIS — Z9089 Acquired absence of other organs: Secondary | ICD-10-CM | POA: Diagnosis not present

## 2016-08-29 DIAGNOSIS — I471 Supraventricular tachycardia: Secondary | ICD-10-CM | POA: Diagnosis not present

## 2016-08-29 DIAGNOSIS — Z79899 Other long term (current) drug therapy: Secondary | ICD-10-CM | POA: Diagnosis not present

## 2016-08-29 DIAGNOSIS — H539 Unspecified visual disturbance: Secondary | ICD-10-CM | POA: Diagnosis not present

## 2016-08-29 DIAGNOSIS — G35 Multiple sclerosis: Secondary | ICD-10-CM | POA: Diagnosis not present

## 2016-08-29 DIAGNOSIS — Z888 Allergy status to other drugs, medicaments and biological substances status: Secondary | ICD-10-CM | POA: Diagnosis not present

## 2016-08-29 DIAGNOSIS — Z9049 Acquired absence of other specified parts of digestive tract: Secondary | ICD-10-CM | POA: Diagnosis not present

## 2016-08-29 DIAGNOSIS — D649 Anemia, unspecified: Secondary | ICD-10-CM | POA: Diagnosis not present

## 2016-08-29 DIAGNOSIS — J45909 Unspecified asthma, uncomplicated: Secondary | ICD-10-CM | POA: Diagnosis not present

## 2016-08-29 DIAGNOSIS — Z87891 Personal history of nicotine dependence: Secondary | ICD-10-CM | POA: Diagnosis not present

## 2016-08-31 ENCOUNTER — Ambulatory Visit: Payer: Self-pay | Admitting: Family Medicine

## 2016-09-01 ENCOUNTER — Other Ambulatory Visit: Payer: Self-pay | Admitting: *Deleted

## 2016-09-01 MED ORDER — DICLOFENAC SODIUM 1 % TD GEL: 4.0000 g | Freq: Four times a day (QID) | TRANSDERMAL | 99 refills | Status: DC

## 2016-09-05 ENCOUNTER — Telehealth: Payer: Self-pay | Admitting: *Deleted

## 2016-09-05 NOTE — Telephone Encounter (Signed)
Diclofenac approved through the insurance. Approval # is [id number]. Pharm notified

## 2016-09-07 ENCOUNTER — Encounter: Payer: Self-pay | Admitting: Family Medicine

## 2016-09-07 ENCOUNTER — Telehealth: Payer: Self-pay | Admitting: *Deleted

## 2016-09-07 ENCOUNTER — Emergency Department (HOSPITAL_BASED_OUTPATIENT_CLINIC_OR_DEPARTMENT_OTHER)
Admission: EM | Admit: 2016-09-07 | Discharge: 2016-09-07 | Disposition: A | Discharge status: Home / Self Care | Payer: Medicare Other | Attending: Emergency Medicine | Admitting: Emergency Medicine

## 2016-09-07 ENCOUNTER — Encounter (HOSPITAL_BASED_OUTPATIENT_CLINIC_OR_DEPARTMENT_OTHER): Payer: Self-pay | Admitting: *Deleted

## 2016-09-07 ENCOUNTER — Ambulatory Visit (INDEPENDENT_AMBULATORY_CARE_PROVIDER_SITE_OTHER): Payer: Medicare Other | Admitting: Family Medicine

## 2016-09-07 VITALS — BP 140/80 | HR 100 | Ht 62.0 in | Wt 203.0 lb

## 2016-09-07 DIAGNOSIS — R079 Chest pain, unspecified: Secondary | ICD-10-CM | POA: Diagnosis not present

## 2016-09-07 DIAGNOSIS — I10 Essential (primary) hypertension: Secondary | ICD-10-CM

## 2016-09-07 DIAGNOSIS — R0789 Other chest pain: Secondary | ICD-10-CM | POA: Diagnosis present

## 2016-09-07 DIAGNOSIS — M797 Fibromyalgia: Secondary | ICD-10-CM | POA: Diagnosis not present

## 2016-09-07 DIAGNOSIS — E876 Hypokalemia: Secondary | ICD-10-CM | POA: Diagnosis not present

## 2016-09-07 DIAGNOSIS — R51 Headache: Secondary | ICD-10-CM

## 2016-09-07 DIAGNOSIS — Z87891 Personal history of nicotine dependence: Secondary | ICD-10-CM | POA: Diagnosis not present

## 2016-09-07 DIAGNOSIS — R002 Palpitations: Secondary | ICD-10-CM | POA: Insufficient documentation

## 2016-09-07 DIAGNOSIS — R519 Headache, unspecified: Secondary | ICD-10-CM

## 2016-09-07 DIAGNOSIS — J4520 Mild intermittent asthma, uncomplicated: Secondary | ICD-10-CM | POA: Insufficient documentation

## 2016-09-07 LAB — BASIC METABOLIC PANEL
BUN: 12 mg/dL (ref 4–21)
Creatinine: 0.5 mg/dL (ref 0.5–1.1)
Glucose: 125 mg/dL
Potassium: 4.2 mmol/L (ref 3.4–5.3)
Sodium: 140 mmol/L (ref 137–147)

## 2016-09-07 LAB — BASIC METABOLIC PANEL WITH GFR: Calcium: 9.5 mg/dL

## 2016-09-07 NOTE — Progress Notes (Signed)
Subjective:    CC: HTN  HPI:  HTN - restarted the labetalol. Stopped the [place].  She decided to stop the [place] because she felt like she was noticing some facial swelling particularly over the maxillary sinus area. That she was also on antibiotics around that time for sinus infection. She decided to restart her on labetalol. It does seem to be effective for lowering her blood pressure but she does feel like it makes her more short of breath when she takes it. She plans on following up with cardiology soon. She is not currently exercising.  Also c/o frontal HA for about 1.5 weeks.  Occ throbbing sensation on the top lfet of her head.  She hasn't been taking anything for the headache. She did go to the emergency department at one point. They offered her Toradol but she had a reaction to it in the past did not take anything. She says it seems to be there in the mornings when she first wakes up and then can progressively get worse throughout the day. It's not waking her up at night though she admits she is not sleeping well. She denies any other known triggers for the headaches. She does have a prior history of migraines this is really hasn't had a problem with those in years. She still has some mild nasal congestion.  She also is complaining of some left-sided chest pain. She said it started suddenly yesterday. No known triggers again. She said it lasted for just a few minutes and then eased off. She said it persisted she would've contacted emergency room. She says afterwards she felt like her heart was fluttering a little bit. Next  Somatic sxs - I did have her complete the form for diagnostic criteria for fibromyalgia. Her wide spread pain index score was 14. And her symptom severity score was 8 which meets the diagnostic criteria for fibromyalgia.  Past medical history, Surgical history, Family history not pertinant except as noted below, Social history, Allergies, and medications have been entered  into the medical record, reviewed, and corrections made.   Review of Systems: No fevers, chills, night sweats, weight loss, chest pain, or shortness of breath.   Objective:    General: Well Developed, well nourished, and in no acute distress.  Neuro: Alert and oriented x3, extra-ocular muscles intact, sensation grossly intact.  HEENT: Normocephalic, atraumatic  Skin: Warm and dry, no rashes. Cardiac: Regular rate and rhythm, no murmurs rubs or gallops, no lower extremity edema.  Respiratory: Clear to auscultation bilaterally. Not using accessory muscles, speaking in full sentences.   Impression and Recommendations:    HTN - BP borderline. She is back on labetolol. Encouraged to work on exercise.   Frontal HA - unclear trigger of her headaches over the last week and a half. She has not been sleeping well typically only a few hours each night. Explained that lack of sleep can trigger headaches as well as major dietary changes. She says she really tries to avoid all caffeine but has been eating some chocolate recently. Also recommend that she avoid nut products for the next week just to see if this improves her headaches as well. If not and please let me know. I don't think this is related to her sinuses which actually seemed to be better compared to when I saw her last week.  Left sided chest pain  - I think this is directly related to her fibromyalgia. Heart exam is normal and she's had multiple cardiac workups in  the past.  Fibromyalgia-reviewed the symptoms score with her. Explained that she does meet diagnostic criteria for fibromyalgia. She's had additional blood work that rules out other problems. I really think this explains her diffuse widespread pain. Today in particular she's having a lot of discomfort over her arms and over her left chest. He also complains about her feet. We certainly could consider treatment options.

## 2016-09-07 NOTE — Telephone Encounter (Signed)
Diclofenac approved.faxed pharmacy the notification
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
## 2016-09-07 NOTE — ED Triage Notes (Signed)
She was seen by her MD this am for check up. After being discharged she went to do errands and decided she should come here for a check up for pain under her breast. No distress.

## 2016-09-08 ENCOUNTER — Encounter (HOSPITAL_BASED_OUTPATIENT_CLINIC_OR_DEPARTMENT_OTHER): Payer: Self-pay | Admitting: *Deleted

## 2016-09-08 ENCOUNTER — Emergency Department (HOSPITAL_BASED_OUTPATIENT_CLINIC_OR_DEPARTMENT_OTHER): Payer: Medicare Other

## 2016-09-08 ENCOUNTER — Emergency Department (HOSPITAL_BASED_OUTPATIENT_CLINIC_OR_DEPARTMENT_OTHER)
Admission: EM | Admit: 2016-09-08 | Discharge: 2016-09-08 | Disposition: A | Discharge status: Home / Self Care | Payer: Medicare Other | Attending: Emergency Medicine | Admitting: Emergency Medicine

## 2016-09-08 DIAGNOSIS — Z79899 Other long term (current) drug therapy: Secondary | ICD-10-CM | POA: Insufficient documentation

## 2016-09-08 DIAGNOSIS — Y92810 Car as the place of occurrence of the external cause: Secondary | ICD-10-CM | POA: Diagnosis not present

## 2016-09-08 DIAGNOSIS — W228XXA Striking against or struck by other objects, initial encounter: Secondary | ICD-10-CM | POA: Insufficient documentation

## 2016-09-08 DIAGNOSIS — S60051A Contusion of right little finger without damage to nail, initial encounter: Secondary | ICD-10-CM | POA: Diagnosis not present

## 2016-09-08 DIAGNOSIS — Z87891 Personal history of nicotine dependence: Secondary | ICD-10-CM | POA: Diagnosis not present

## 2016-09-08 DIAGNOSIS — S60419A Abrasion of unspecified finger, initial encounter: Secondary | ICD-10-CM

## 2016-09-08 DIAGNOSIS — S6991XA Unspecified injury of right wrist, hand and finger(s), initial encounter: Secondary | ICD-10-CM | POA: Diagnosis not present

## 2016-09-08 DIAGNOSIS — Y999 Unspecified external cause status: Secondary | ICD-10-CM | POA: Diagnosis not present

## 2016-09-08 DIAGNOSIS — Y939 Activity, unspecified: Secondary | ICD-10-CM | POA: Insufficient documentation

## 2016-09-08 DIAGNOSIS — M79644 Pain in right finger(s): Secondary | ICD-10-CM | POA: Diagnosis not present

## 2016-09-08 DIAGNOSIS — J45909 Unspecified asthma, uncomplicated: Secondary | ICD-10-CM | POA: Diagnosis not present

## 2016-09-08 DIAGNOSIS — M7989 Other specified soft tissue disorders: Secondary | ICD-10-CM | POA: Diagnosis not present

## 2016-09-08 DIAGNOSIS — I10 Essential (primary) hypertension: Secondary | ICD-10-CM | POA: Diagnosis not present

## 2016-09-08 DIAGNOSIS — S60416A Abrasion of right little finger, initial encounter: Secondary | ICD-10-CM | POA: Diagnosis not present

## 2016-09-08 NOTE — ED Notes (Signed)
Patient transported to X-ray 

## 2016-09-08 NOTE — ED Triage Notes (Signed)
Abrasion to her right 5th digit. She hit it on a metal piece inside her car door. Pain in her 4th and 5th digits.
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
## 2016-09-11 DIAGNOSIS — Z885 Allergy status to narcotic agent status: Secondary | ICD-10-CM | POA: Diagnosis not present

## 2016-09-11 DIAGNOSIS — Z9289 Personal history of other medical treatment: Secondary | ICD-10-CM | POA: Diagnosis not present

## 2016-09-11 DIAGNOSIS — G4730 Sleep apnea, unspecified: Secondary | ICD-10-CM | POA: Diagnosis not present

## 2016-09-11 DIAGNOSIS — Z888 Allergy status to other drugs, medicaments and biological substances status: Secondary | ICD-10-CM | POA: Diagnosis not present

## 2016-09-11 DIAGNOSIS — E785 Hyperlipidemia, unspecified: Secondary | ICD-10-CM | POA: Diagnosis not present

## 2016-09-11 DIAGNOSIS — G35 Multiple sclerosis: Secondary | ICD-10-CM | POA: Diagnosis not present

## 2016-09-11 DIAGNOSIS — R61 Generalized hyperhidrosis: Secondary | ICD-10-CM | POA: Diagnosis not present

## 2016-09-11 DIAGNOSIS — R079 Chest pain, unspecified: Secondary | ICD-10-CM | POA: Diagnosis not present

## 2016-09-11 DIAGNOSIS — M79602 Pain in left arm: Secondary | ICD-10-CM | POA: Diagnosis not present

## 2016-09-11 DIAGNOSIS — R51 Headache: Secondary | ICD-10-CM | POA: Diagnosis not present

## 2016-09-11 DIAGNOSIS — I10 Essential (primary) hypertension: Secondary | ICD-10-CM | POA: Diagnosis not present

## 2016-09-11 DIAGNOSIS — Z886 Allergy status to analgesic agent status: Secondary | ICD-10-CM | POA: Diagnosis not present

## 2016-09-11 DIAGNOSIS — Z87891 Personal history of nicotine dependence: Secondary | ICD-10-CM | POA: Diagnosis not present

## 2016-09-11 DIAGNOSIS — Z880 Allergy status to penicillin: Secondary | ICD-10-CM | POA: Diagnosis not present

## 2016-09-12 ENCOUNTER — Other Ambulatory Visit: Payer: Self-pay

## 2016-09-12 NOTE — Patient Outreach (Signed)
[facility]) Care Management  09/12/2016  Jennifer [name] 12-10-1965 [id number]  Screening call completed to patient referred from the ED List.  HIPAA identifiers confirmed.  Patient reports she has multiple chronic illnesses including HTN, cancer, and tachycardia.  She takes more than 12 medications. She frequently goes to the ED, but states usually when she calls her PCP they advise her to go to ED.  She states she sometimes goes to Urgent Care.  [facility] services explained to patient. Patient refused services.  Offered to mail [facility] brochure with contact information, but she refused that as well.  Encouraged patient to call [facility] if she decides she would like our services.  Plan:  Notify PCP and CMA of case closure due to refusal of services.  Candie Mile, RN, MSN [place] [phone] Fax [phone]

## 2016-09-13 ENCOUNTER — Telehealth: Payer: Self-pay | Admitting: *Deleted

## 2016-09-13 NOTE — Telephone Encounter (Signed)
Patient called and states her left arm and lower back are hurting and want to know if she can have an MRI.

## 2016-09-14 ENCOUNTER — Ambulatory Visit (INDEPENDENT_AMBULATORY_CARE_PROVIDER_SITE_OTHER): Payer: Medicare Other

## 2016-09-14 ENCOUNTER — Ambulatory Visit (INDEPENDENT_AMBULATORY_CARE_PROVIDER_SITE_OTHER): Payer: Medicare Other | Admitting: Family Medicine

## 2016-09-14 ENCOUNTER — Encounter: Payer: Self-pay | Admitting: Family Medicine

## 2016-09-14 VITALS — BP 140/82 | HR 87 | Wt 207.0 lb

## 2016-09-14 DIAGNOSIS — M542 Cervicalgia: Secondary | ICD-10-CM

## 2016-09-14 DIAGNOSIS — M545 Low back pain, unspecified: Secondary | ICD-10-CM

## 2016-09-14 DIAGNOSIS — M419 Scoliosis, unspecified: Secondary | ICD-10-CM | POA: Diagnosis not present

## 2016-09-14 DIAGNOSIS — I700 Atherosclerosis of aorta: Secondary | ICD-10-CM

## 2016-09-14 DIAGNOSIS — M4692 Unspecified inflammatory spondylopathy, cervical region: Secondary | ICD-10-CM | POA: Diagnosis not present

## 2016-09-14 DIAGNOSIS — M25512 Pain in left shoulder: Secondary | ICD-10-CM

## 2016-09-14 DIAGNOSIS — R1013 Epigastric pain: Secondary | ICD-10-CM | POA: Diagnosis not present

## 2016-09-14 DIAGNOSIS — G8929 Other chronic pain: Secondary | ICD-10-CM

## 2016-09-14 DIAGNOSIS — R0789 Other chest pain: Secondary | ICD-10-CM | POA: Diagnosis not present

## 2016-09-14 DIAGNOSIS — I471 Supraventricular tachycardia: Secondary | ICD-10-CM | POA: Diagnosis not present

## 2016-09-14 DIAGNOSIS — M47816 Spondylosis without myelopathy or radiculopathy, lumbar region: Secondary | ICD-10-CM | POA: Diagnosis not present

## 2016-09-14 DIAGNOSIS — I10 Essential (primary) hypertension: Secondary | ICD-10-CM | POA: Diagnosis not present
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
## 2016-09-14 NOTE — Telephone Encounter (Signed)
Patient notified via v/m.

## 2016-09-14 NOTE — Patient Instructions (Signed)
Thank you for coming in today. Get xray today.  Do home PT.  In 2 weeks if not better we will do formal PT.  Recheck in 4-6 weeks.  See hand out   Come back or go to the emergency room if you notice new weakness new numbness problems walking or bowel or bladder problems.

## 2016-09-14 NOTE — Telephone Encounter (Signed)
NO, would recommend PT or see Dr. Darene Lamer

## 2016-09-21 ENCOUNTER — Ambulatory Visit (INDEPENDENT_AMBULATORY_CARE_PROVIDER_SITE_OTHER): Payer: Medicare Other | Admitting: Family Medicine

## 2016-09-21 VITALS — BP 136/72 | HR 105 | Ht 62.0 in | Wt 208.0 lb

## 2016-09-21 DIAGNOSIS — M545 Low back pain, unspecified: Secondary | ICD-10-CM

## 2016-09-21 DIAGNOSIS — Z888 Allergy status to other drugs, medicaments and biological substances status: Secondary | ICD-10-CM | POA: Diagnosis not present

## 2016-09-21 DIAGNOSIS — E785 Hyperlipidemia, unspecified: Secondary | ICD-10-CM | POA: Diagnosis not present

## 2016-09-21 DIAGNOSIS — Z23 Encounter for immunization: Secondary | ICD-10-CM

## 2016-09-21 DIAGNOSIS — J45909 Unspecified asthma, uncomplicated: Secondary | ICD-10-CM | POA: Diagnosis not present

## 2016-09-21 DIAGNOSIS — I10 Essential (primary) hypertension: Secondary | ICD-10-CM | POA: Diagnosis not present

## 2016-09-21 DIAGNOSIS — G4730 Sleep apnea, unspecified: Secondary | ICD-10-CM | POA: Diagnosis not present

## 2016-09-21 DIAGNOSIS — Z880 Allergy status to penicillin: Secondary | ICD-10-CM | POA: Diagnosis not present

## 2016-09-21 DIAGNOSIS — M25511 Pain in right shoulder: Secondary | ICD-10-CM

## 2016-09-21 DIAGNOSIS — G8929 Other chronic pain: Secondary | ICD-10-CM

## 2016-09-21 DIAGNOSIS — Z885 Allergy status to narcotic agent status: Secondary | ICD-10-CM | POA: Diagnosis not present

## 2016-09-21 DIAGNOSIS — Z87891 Personal history of nicotine dependence: Secondary | ICD-10-CM | POA: Diagnosis not present

## 2016-09-21 DIAGNOSIS — Z886 Allergy status to analgesic agent status: Secondary | ICD-10-CM | POA: Diagnosis not present

## 2016-09-21 DIAGNOSIS — M797 Fibromyalgia: Secondary | ICD-10-CM

## 2016-09-21 DIAGNOSIS — D649 Anemia, unspecified: Secondary | ICD-10-CM | POA: Diagnosis not present

## 2016-09-21 DIAGNOSIS — G35 Multiple sclerosis: Secondary | ICD-10-CM | POA: Diagnosis not present

## 2016-09-21 DIAGNOSIS — Z9289 Personal history of other medical treatment: Secondary | ICD-10-CM | POA: Diagnosis not present

## 2016-09-21 DIAGNOSIS — M19019 Primary osteoarthritis, unspecified shoulder: Secondary | ICD-10-CM | POA: Diagnosis not present

## 2016-09-21 DIAGNOSIS — R110 Nausea: Secondary | ICD-10-CM | POA: Diagnosis not present

## 2016-09-21 MED ORDER — CYCLOBENZAPRINE HCL 10 MG PO TABS: ORAL_TABLET | ORAL | 0 refills | Status: DC

## 2016-09-21 MED ORDER — MUPIROCIN 2 % EX OINT: TOPICAL_OINTMENT | Freq: Two times a day (BID) | CUTANEOUS | 0 refills | Status: DC

## 2016-09-21 NOTE — Progress Notes (Signed)
Subjective:    CC: HTN, 2 week f/u   HPI:  Hypertension- Pt denies chest pain, SOB, dizziness, or heart palpitations.  Taking meds as directed w/o problems.  Denies medication side effects.  She is on the labetalol.  She did actually get to the gym once last week. She said she walked on the treadmill for 30 minutes and afterwards felt lightheaded and worn out a time she went home. Hasn't been back since.  Still feeling some SOB.   She is having more difficulty with pain.  In particular the right outer shoulder is bothering her. She says it's tender just right on the external shoulder joint just at the edge of the acromium. Her back is also been bothering her. Particularly in the low back. And in her neck. Occasionally she'll get a shooting pain up her spine that will actually trigger a headache.  Still having some episodes of lightheadedness.   Has felt more down lately.  Says has really been couped up in the house because she hasn't felt well. Says has been more tearful.    Past medical history, Surgical history, Family history not pertinant except as noted below, Social history, Allergies, and medications have been entered into the medical record, reviewed, and corrections made.   Review of Systems: No fevers, chills, night sweats, weight loss, chest pain, or shortness of breath.   Objective:    General: Well Developed, well nourished, and in no acute distress.  Neuro: Alert and oriented x3, extra-ocular muscles intact, sensation grossly intact.  HEENT: Normocephalic, atraumatic  Skin: Warm and dry, no rashes. Cardiac: Regular rate and rhythm, no murmurs rubs or gallops, no lower extremity edema.  Respiratory: Clear to auscultation bilaterally. Not using accessory muscles, speaking in full sentences.   Impression and Recommendations:    HTN - Well controlled. Continue current regimen. Follow up in  1 months.   Fibromyalgia - Discussed trial of low dose flexeril at bedtime.  She can  quarter the 10mg  tab. I want her to try this nightly for 2 weeks until I see her back.    Back Pain- trial of muscle relaxer at betime. Work on graded exercise.  Also dicussed could consider cymbalta in the future.    Right shoulder pain - work on exercises and f/u back up with sports med if not improving.     Mild depression -we discussed maybe volunteering to get out of the house since unable to work. Not interested in medication at this time.  Flu vaccine given today.

## 2016-09-22 ENCOUNTER — Encounter: Payer: Self-pay | Admitting: Sports Medicine

## 2016-09-22 ENCOUNTER — Ambulatory Visit (INDEPENDENT_AMBULATORY_CARE_PROVIDER_SITE_OTHER): Payer: Medicare Other | Admitting: Sports Medicine

## 2016-09-22 DIAGNOSIS — M19011 Primary osteoarthritis, right shoulder: Secondary | ICD-10-CM

## 2016-09-22 NOTE — Progress Notes (Signed)
  Subjective:    CC: Right shoulder pain  HPI: This is a 51 year old female, she has a long history of right-sided shoulder pain, MRI from 2016 showed glenohumeral degenerative changes, labral tearing, acromioclavicular osteoarthritis, rotator cuff was intact. She tried oral NSAIDs, massage, icing, physical therapy without any improvement and is agreeable to proceed with injection.  Past medical history:  Negative.  See flowsheet/record as well for more information.  Surgical history: Negative.  See flowsheet/record as well for more information.  Family history: Negative.  See flowsheet/record as well for more information.  Social history: Negative.  See flowsheet/record as well for more information.  Allergies, and medications have been entered into the medical record, reviewed, and no changes needed.   Review of Systems: No fevers, chills, night sweats, weight loss, chest pain, or shortness of breath.   Objective:    General: Well Developed, well nourished, and in no acute distress.  Neuro: Alert and oriented x3, extra-ocular muscles intact, sensation grossly intact.  HEENT: Normocephalic, atraumatic, pupils equal round reactive to light, neck supple, no masses, no lymphadenopathy, thyroid nonpalpable.  Skin: Warm and dry, no rashes. Cardiac: Regular rate and rhythm, no murmurs rubs or gallops, no lower extremity edema.  Respiratory: Clear to auscultation bilaterally. Not using accessory muscles, speaking in full sentences. Right Shoulder: Inspection reveals no abnormalities, atrophy or asymmetry. Palpation is normal with no tenderness over AC joint or bicipital groove. ROM is full in all planes. Rotator cuff strength normal throughout. No signs of impingement with negative Neer and Hawkin's tests, empty can. Speeds and Yergason's tests normal. Positive Obrien's, negative crank, negative clunk, and good stability. Normal scapular function observed. No painful arc and no drop arm  sign. No apprehension sign  Procedure: Real-time Ultrasound Guided Injection of right glenohumeral joint Device: GE Logiq E  Verbal informed consent obtained.  Time-out conducted.  Noted no overlying erythema, induration, or other signs of local infection.  Skin prepped in a sterile fashion.  Local anesthesia: Topical Ethyl chloride.  With sterile technique and under real time ultrasound guidance:  Using a 22-gauge spinal needle advanced into the glenohumeral joint from a posterior approach, I injected 1 mL Kenalog 40, 2 mL lidocaine, 2 mL Marcaine. Completed without difficulty  Pain immediately resolved suggesting accurate placement of the medication.  Advised to call if fevers/chills, erythema, induration, drainage, or persistent bleeding.  Images permanently stored and available for review in the ultrasound unit.  Impression: Technically successful ultrasound guided injection.  Impression and Recommendations:    Arthritis of right acromioclavicular joint Pain today's referable to the right glenohumeral joint, she does have labral tearing with glenohumeral degenerative changes on MRI. At this point we are going to proceed with glenohumeral injection with close follow-up.

## 2016-09-22 NOTE — Assessment & Plan Note (Signed)
Pain today's referable to the right glenohumeral joint, she does have labral tearing with glenohumeral degenerative changes on MRI. At this point we are going to proceed with glenohumeral injection with close follow-up.

## 2016-09-25 ENCOUNTER — Encounter (HOSPITAL_BASED_OUTPATIENT_CLINIC_OR_DEPARTMENT_OTHER): Payer: Self-pay | Admitting: *Deleted

## 2016-09-25 ENCOUNTER — Emergency Department (HOSPITAL_BASED_OUTPATIENT_CLINIC_OR_DEPARTMENT_OTHER)
Admission: EM | Admit: 2016-09-25 | Discharge: 2016-09-25 | Disposition: A | Discharge status: Home / Self Care | Payer: Medicare Other | Attending: Emergency Medicine | Admitting: Emergency Medicine

## 2016-09-25 DIAGNOSIS — R42 Dizziness and giddiness: Secondary | ICD-10-CM

## 2016-09-25 DIAGNOSIS — Z8541 Personal history of malignant neoplasm of cervix uteri: Secondary | ICD-10-CM | POA: Insufficient documentation

## 2016-09-25 DIAGNOSIS — I10 Essential (primary) hypertension: Secondary | ICD-10-CM | POA: Insufficient documentation

## 2016-09-25 DIAGNOSIS — J45909 Unspecified asthma, uncomplicated: Secondary | ICD-10-CM | POA: Insufficient documentation

## 2016-09-25 DIAGNOSIS — H539 Unspecified visual disturbance: Secondary | ICD-10-CM | POA: Diagnosis not present

## 2016-09-25 DIAGNOSIS — R55 Syncope and collapse: Secondary | ICD-10-CM | POA: Insufficient documentation

## 2016-09-25 DIAGNOSIS — Z87891 Personal history of nicotine dependence: Secondary | ICD-10-CM | POA: Diagnosis not present

## 2016-09-25 DIAGNOSIS — Z79899 Other long term (current) drug therapy: Secondary | ICD-10-CM | POA: Insufficient documentation
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
## 2016-09-25 NOTE — ED Triage Notes (Signed)
States she has been sweating for the past 2 days. Lightheaded and pain in her shoulders. Her MD wanted to give her a steroid shot but she refused. She is no distress at triage. Drove herself here.

## 2016-09-25 NOTE — ED Notes (Addendum)
Pt states she got really sweaty, lightheaded, with spotty vision. Pt states she has also been complaining to her PCP that both her shoulders have been hurting but they have not listened. Pt feels faint with blurry vision but drove herself into the ED.

## 2016-09-25 NOTE — ED Notes (Signed)
ED Provider at bedside. [name]

## 2016-09-28 IMAGING — DX DG CHEST 2V
2 series · 2 of 2 positions shown · non-contrast
Comparison: 05/06/2015

CLINICAL DATA: Shortness of breath for 2 days. No relief with
breathing treatment and steroids at [REDACTED]
this morning.

EXAM:
CHEST  2 VIEW

[chest pa]
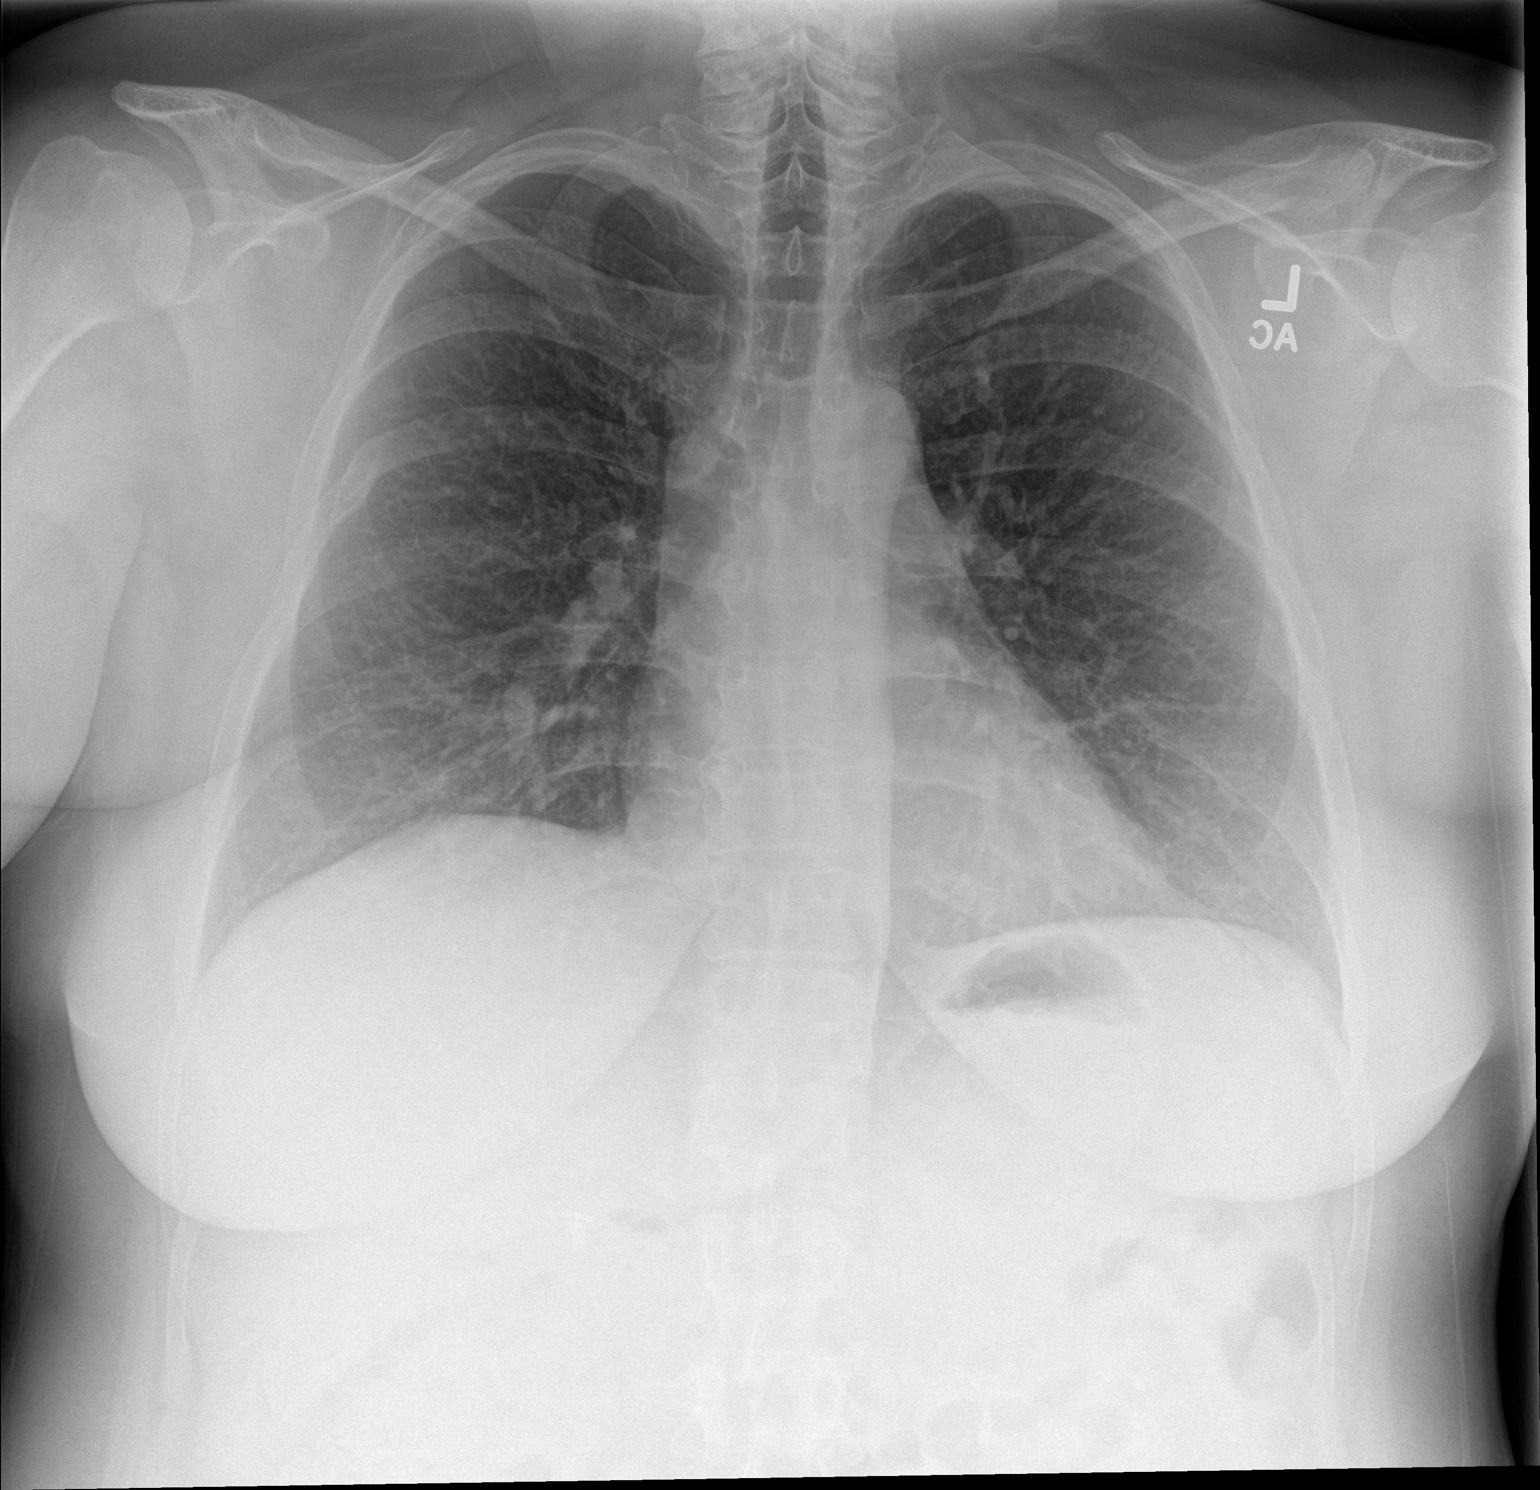

[chest lat]
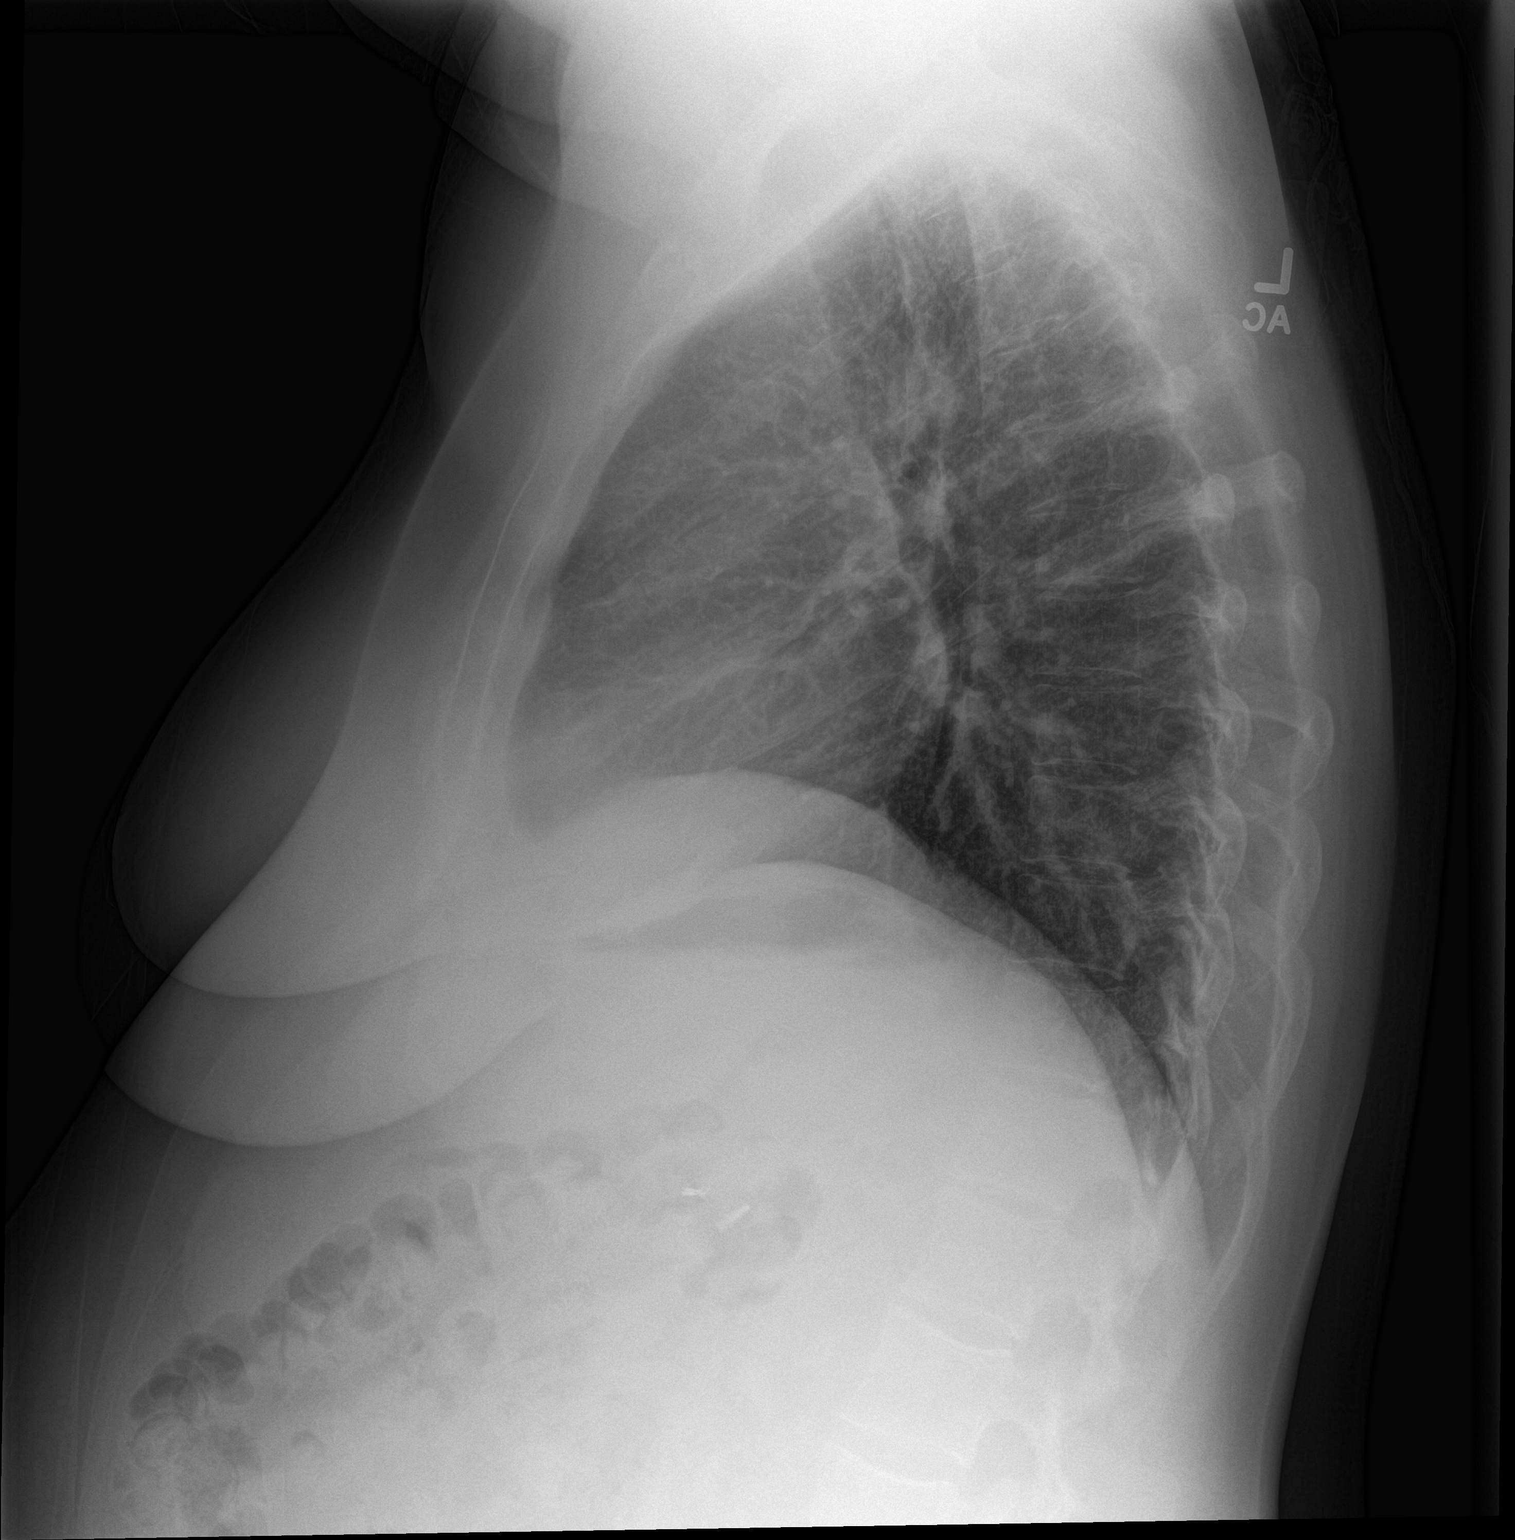

[2 of 2 positions shown; findings below may reference images not displayed]

FINDINGS: Normal heart size and pulmonary vascularity. No focal airspace
disease or consolidation in the lungs. No blunting of costophrenic
angles. No pneumothorax. Mediastinal contours appear intact. Old
right rib deformities. Surgical clips in the right upper quadrant.
IMPRESSION: No active cardiopulmonary disease.

## 2016-10-01 DIAGNOSIS — Z8249 Family history of ischemic heart disease and other diseases of the circulatory system: Secondary | ICD-10-CM | POA: Diagnosis not present

## 2016-10-01 DIAGNOSIS — K219 Gastro-esophageal reflux disease without esophagitis: Secondary | ICD-10-CM | POA: Diagnosis not present

## 2016-10-01 DIAGNOSIS — M549 Dorsalgia, unspecified: Secondary | ICD-10-CM | POA: Diagnosis not present

## 2016-10-01 DIAGNOSIS — Z9889 Other specified postprocedural states: Secondary | ICD-10-CM | POA: Diagnosis not present

## 2016-10-01 DIAGNOSIS — I471 Supraventricular tachycardia: Secondary | ICD-10-CM | POA: Diagnosis not present

## 2016-10-01 DIAGNOSIS — Z87891 Personal history of nicotine dependence: Secondary | ICD-10-CM | POA: Diagnosis not present

## 2016-10-01 DIAGNOSIS — D509 Iron deficiency anemia, unspecified: Secondary | ICD-10-CM | POA: Diagnosis not present

## 2016-10-01 DIAGNOSIS — R7303 Prediabetes: Secondary | ICD-10-CM | POA: Diagnosis not present

## 2016-10-01 DIAGNOSIS — G35 Multiple sclerosis: Secondary | ICD-10-CM | POA: Diagnosis not present

## 2016-10-01 DIAGNOSIS — Z885 Allergy status to narcotic agent status: Secondary | ICD-10-CM | POA: Diagnosis not present

## 2016-10-01 DIAGNOSIS — E785 Hyperlipidemia, unspecified: Secondary | ICD-10-CM | POA: Diagnosis not present

## 2016-10-01 DIAGNOSIS — Z9049 Acquired absence of other specified parts of digestive tract: Secondary | ICD-10-CM | POA: Diagnosis not present

## 2016-10-01 DIAGNOSIS — K5730 Diverticulosis of large intestine without perforation or abscess without bleeding: Secondary | ICD-10-CM | POA: Diagnosis not present

## 2016-10-01 DIAGNOSIS — K2270 Barrett's esophagus without dysplasia: Secondary | ICD-10-CM | POA: Diagnosis not present

## 2016-10-01 DIAGNOSIS — Z880 Allergy status to penicillin: Secondary | ICD-10-CM | POA: Diagnosis not present

## 2016-10-01 DIAGNOSIS — G8929 Other chronic pain: Secondary | ICD-10-CM | POA: Diagnosis not present

## 2016-10-01 DIAGNOSIS — Z9289 Personal history of other medical treatment: Secondary | ICD-10-CM | POA: Diagnosis not present

## 2016-10-01 DIAGNOSIS — E2609 Other primary hyperaldosteronism: Secondary | ICD-10-CM | POA: Diagnosis not present

## 2016-10-01 DIAGNOSIS — R140 Abdominal distension (gaseous): Secondary | ICD-10-CM | POA: Diagnosis not present

## 2016-10-01 DIAGNOSIS — Z886 Allergy status to analgesic agent status: Secondary | ICD-10-CM | POA: Diagnosis not present

## 2016-10-01 DIAGNOSIS — R109 Unspecified abdominal pain: Secondary | ICD-10-CM | POA: Diagnosis not present

## 2016-10-01 DIAGNOSIS — R1310 Dysphagia, unspecified: Secondary | ICD-10-CM | POA: Diagnosis not present

## 2016-10-01 DIAGNOSIS — R61 Generalized hyperhidrosis: Secondary | ICD-10-CM | POA: Diagnosis not present

## 2016-10-01 DIAGNOSIS — Z888 Allergy status to other drugs, medicaments and biological substances status: Secondary | ICD-10-CM | POA: Diagnosis not present

## 2016-10-01 DIAGNOSIS — R569 Unspecified convulsions: Secondary | ICD-10-CM | POA: Diagnosis not present

## 2016-10-01 DIAGNOSIS — M797 Fibromyalgia: Secondary | ICD-10-CM | POA: Diagnosis not present

## 2016-10-01 DIAGNOSIS — K5900 Constipation, unspecified: Secondary | ICD-10-CM | POA: Diagnosis not present

## 2016-10-01 DIAGNOSIS — Z9861 Coronary angioplasty status: Secondary | ICD-10-CM | POA: Diagnosis not present

## 2016-10-01 DIAGNOSIS — I10 Essential (primary) hypertension: Secondary | ICD-10-CM | POA: Diagnosis not present

## 2016-10-01 DIAGNOSIS — R112 Nausea with vomiting, unspecified: Secondary | ICD-10-CM | POA: Diagnosis not present

## 2016-10-01 DIAGNOSIS — Z85828 Personal history of other malignant neoplasm of skin: Secondary | ICD-10-CM | POA: Diagnosis not present

## 2016-10-01 DIAGNOSIS — J45909 Unspecified asthma, uncomplicated: Secondary | ICD-10-CM | POA: Diagnosis not present

## 2016-10-05 ENCOUNTER — Encounter: Payer: Self-pay | Admitting: Sports Medicine

## 2016-10-05 ENCOUNTER — Encounter: Payer: Self-pay | Admitting: Family Medicine

## 2016-10-05 ENCOUNTER — Telehealth: Payer: Self-pay | Admitting: Family Medicine

## 2016-10-05 ENCOUNTER — Ambulatory Visit (INDEPENDENT_AMBULATORY_CARE_PROVIDER_SITE_OTHER): Payer: Medicare Other | Admitting: Family Medicine

## 2016-10-05 ENCOUNTER — Ambulatory Visit (INDEPENDENT_AMBULATORY_CARE_PROVIDER_SITE_OTHER): Payer: Medicare Other | Admitting: Sports Medicine

## 2016-10-05 VITALS — BP 119/73 | HR 96 | Ht 62.0 in | Wt 206.0 lb

## 2016-10-05 DIAGNOSIS — I10 Essential (primary) hypertension: Secondary | ICD-10-CM | POA: Diagnosis not present

## 2016-10-05 DIAGNOSIS — R42 Dizziness and giddiness: Secondary | ICD-10-CM

## 2016-10-05 DIAGNOSIS — M5412 Radiculopathy, cervical region: Secondary | ICD-10-CM | POA: Diagnosis not present

## 2016-10-05 DIAGNOSIS — M5030 Other cervical disc degeneration, unspecified cervical region: Secondary | ICD-10-CM

## 2016-10-05 DIAGNOSIS — M19011 Primary osteoarthritis, right shoulder: Secondary | ICD-10-CM

## 2016-10-05 DIAGNOSIS — F339 Major depressive disorder, recurrent, unspecified: Secondary | ICD-10-CM

## 2016-10-05 DIAGNOSIS — M797 Fibromyalgia: Secondary | ICD-10-CM

## 2016-10-05 DIAGNOSIS — K5909 Other constipation: Secondary | ICD-10-CM

## 2016-10-05 MED ORDER — GABAPENTIN 100 MG PO CAPS: ORAL_CAPSULE | ORAL | 3 refills | Status: DC

## 2016-10-05 NOTE — Progress Notes (Signed)
Subjective:    CC: Fibromyalgia  HPI:   Fibromyalgia - patient here today for follow-up. We have discussed her diagnosis several weeks ago. Had recommended a trial of a quarter tablet of cyclobenzaprine at bedtime. She said she took it a couple times and didn't like how it made her feel so she quit taking it.. She also says that she's not been sleeping well which is been a persistent complaint. And sometimes she'll actually taking a nap around 7 PM at night and has been staying up late.  She's also having intermittent episodes of dizziness. In fact she actually went back to emergency department recently for recurrent symptoms. While there she spoke with the PA who suggested a possible diagnosis of dysautonomia. Socially since she has problems with her blood pressure being up and down as well as her pulse and having this dizziness related to position change etc. She brought in a paper about that today and wanted to discuss it.  She is also still struggling with constipation and went to the emergency department for that as well. She is taken 2 bottles of magnesium citrate as well as over-the-counter stimulant laxative. She does feel like she is moving her bowels more now.  She also saw Dr. Dianah Field, her sports medicine provider had recommended a trial of gabapentin for cervical radiculopathy. She is very nervous about trying any medication. She would like to discuss that today as well. Hypertension- Pt denies chest pain, SOB, dizziness, or heart palpitations.  Taking meds as directed w/o problems.  Denies medication side effects.    She felt more depressed and down recently. She's noticed that she's been staying in more not getting out. She's been withdrawing from friends and she's been more angry and irritable. She's not currently enrolled in any type of therapy or counseling and is not interested in medication treatment at this time.   Past medical history, Surgical history, Family history not  pertinant except as noted below, Social history, Allergies, and medications have been entered into the medical record, reviewed, and corrections made.   Review of Systems: No fevers, chills, night sweats, weight loss, chest pain, or shortness of breath.   Objective:    General: Well Developed, well nourished, and in no acute distress.  Neuro: Alert and oriented x3, extra-ocular muscles intact, sensation grossly intact.  HEENT: Normocephalic, atraumatic  Skin: Warm and dry, no rashes. Cardiac: Regular rate and rhythm, no murmurs rubs or gallops, no lower extremity edema.  Respiratory: Clear to auscultation bilaterally. Not using accessory muscles, speaking in full sentences.   Impression and Recommendations:    Fibromyalgia - Discussed diagnosis again. I still would like for her to try the quarter tab of the Flexeril at bedtime for about 10 days in a row just to see if this helps with her pain as well as her sleep issues. But she also needs to work on her sleep hygiene. Actually think she would be a good candidate for Cymbalta. I think you could help with some of her depression, fibrous symptoms as well as possible muscular skeletal back pain. HTN - blood pressure looks absolutely phenomenal today. Continue current regimen.  Chronic constipation-continue with regimen. Can use laxatives as needed. Really encourage her to drink more fluid this is always been a bit of a struggle for her.  Cervical radiculopathy-consider trial of gabapentin.  Recurrent depression, discussed possible trial of Cymbalta. She's tried multiple medications in the past without success and often with side effects. Discussed therapy and counseling  as well. It has benefited her in the past and she feel like financial she can't do it and she doesn't have reliable transportation now.    Dizziness - consider dysautonomia. Can see if we can try to get her scheduled for tilt table test.  Time spent 45 min, > 50% spent  counseling about her myalgia, chronic constipation, cervical radiculopathy, depression, dizziness

## 2016-10-05 NOTE — Assessment & Plan Note (Signed)
She is complaining of additional shoulder pain with radiation down to the fingers, highly consistent with cervical radiculopathy. X-ray did show C4-C5 degenerative changes, we are going to proceed with an MRI, and she agrees to take gabapentin 100 and slowly up taper.

## 2016-10-05 NOTE — Telephone Encounter (Signed)
Left VM for EP lab at [facility] regarding scheduling.

## 2016-10-05 NOTE — Telephone Encounter (Signed)
Call regional physicians at [facility] and see if we would be able to order and schedule a tilt table test or if this has to go through cardiology to be scheduled.  Beatrice Lecher, MD

## 2016-10-05 NOTE — Progress Notes (Signed)
  Subjective:    CC: Follow-up  HPI: Right shoulder pain: We did a glenohumeral injection about 2 weeks ago, she has returned early. MRI did show multiple internal derangements in the shoulder, and symptoms were predominantly glenohumeral at the last visit.  She's done well, her right shoulder pain has resolved and she has full range of motion. Unfortunately she is complaining of neck pain radiating down the arm to the second and third fingers. She did have a x-ray not too long ago that showed some C4-C5 degenerative disc disease, an MRI from many years ago showed no protruding discs.  Past medical history:  Negative.  See flowsheet/record as well for more information.  Surgical history: Negative.  See flowsheet/record as well for more information.  Family history: Negative.  See flowsheet/record as well for more information.  Social history: Negative.  See flowsheet/record as well for more information.  Allergies, and medications have been entered into the medical record, reviewed, and no changes needed.   Review of Systems: No fevers, chills, night sweats, weight loss, chest pain, or shortness of breath.   Objective:    General: Well Developed, well nourished, and in no acute distress.  Neuro: Alert and oriented x3, extra-ocular muscles intact, sensation grossly intact.  HEENT: Normocephalic, atraumatic, pupils equal round reactive to light, neck supple, no masses, no lymphadenopathy, thyroid nonpalpable.  Skin: Warm and dry, no rashes. Cardiac: Regular rate and rhythm, no murmurs rubs or gallops, no lower extremity edema.  Respiratory: Clear to auscultation bilaterally. Not using accessory muscles, speaking in full sentences. Neck: Positive Spurling sign on the right Full neck range of motion Grip strength and sensation normal in bilateral hands Strength good C4 to T1 distribution No sensory change to C4 to T1 Reflexes normal  Impression and Recommendations:    Arthritis of right  acromioclavicular joint Multiple derangements, we did do a glenohumeral injection at the last time, she is actually responded very well and is pain-free with regards to her shoulder. Has full range of motion. She is complaining of additional shoulder pain with radiation down to the fingers, highly consistent with cervical radiculopathy. X-ray did show C4-C5 degenerative changes, we are going to proceed with an MRI, and she agrees to take gabapentin 100 and slowly up taper.  DDD (degenerative disc disease), cervical She is complaining of additional shoulder pain with radiation down to the fingers, highly consistent with cervical radiculopathy. X-ray did show C4-C5 degenerative changes, we are going to proceed with an MRI, and she agrees to take gabapentin 100 and slowly up taper.  I spent 25 minutes with this patient, greater than 50% was face-to-face time counseling regarding the above diagnoses

## 2016-10-05 NOTE — Assessment & Plan Note (Signed)
Multiple derangements, we did do a glenohumeral injection at the last time, she is actually responded very well and is pain-free with regards to her shoulder. Has full range of motion. She is complaining of additional shoulder pain with radiation down to the fingers, highly consistent with cervical radiculopathy. X-ray did show C4-C5 degenerative changes, we are going to proceed with an MRI, and she agrees to take gabapentin 100 and slowly up taper.

## 2016-10-06 DIAGNOSIS — K449 Diaphragmatic hernia without obstruction or gangrene: Secondary | ICD-10-CM | POA: Diagnosis not present

## 2016-10-06 DIAGNOSIS — Z9049 Acquired absence of other specified parts of digestive tract: Secondary | ICD-10-CM | POA: Diagnosis not present

## 2016-10-06 DIAGNOSIS — G35 Multiple sclerosis: Secondary | ICD-10-CM | POA: Diagnosis not present

## 2016-10-06 DIAGNOSIS — K209 Esophagitis, unspecified: Secondary | ICD-10-CM | POA: Diagnosis not present

## 2016-10-06 DIAGNOSIS — K2950 Unspecified chronic gastritis without bleeding: Secondary | ICD-10-CM | POA: Diagnosis not present

## 2016-10-06 DIAGNOSIS — K219 Gastro-esophageal reflux disease without esophagitis: Secondary | ICD-10-CM | POA: Diagnosis not present

## 2016-10-06 DIAGNOSIS — G4730 Sleep apnea, unspecified: Secondary | ICD-10-CM | POA: Diagnosis not present

## 2016-10-06 DIAGNOSIS — I10 Essential (primary) hypertension: Secondary | ICD-10-CM | POA: Diagnosis not present

## 2016-10-06 DIAGNOSIS — M797 Fibromyalgia: Secondary | ICD-10-CM | POA: Diagnosis not present

## 2016-10-06 DIAGNOSIS — Z79899 Other long term (current) drug therapy: Secondary | ICD-10-CM | POA: Diagnosis not present

## 2016-10-06 DIAGNOSIS — Z880 Allergy status to penicillin: Secondary | ICD-10-CM | POA: Diagnosis not present

## 2016-10-06 DIAGNOSIS — J45909 Unspecified asthma, uncomplicated: Secondary | ICD-10-CM | POA: Diagnosis not present

## 2016-10-06 DIAGNOSIS — K317 Polyp of stomach and duodenum: Secondary | ICD-10-CM | POA: Diagnosis not present

## 2016-10-06 LAB — BASIC METABOLIC PANEL
BUN: 14 mg/dL (ref 4–21)
Glucose: 107 mg/dL
Potassium: 4.4 mmol/L (ref 3.4–5.3)
Sodium: 141 mmol/L (ref 137–147)

## 2016-10-06 NOTE — Telephone Encounter (Signed)
Ok please let patient know. She would have to request this through her CArdiologist.

## 2016-10-06 NOTE — Telephone Encounter (Signed)
Please see update

## 2016-10-06 NOTE — Telephone Encounter (Signed)
Pt advised.

## 2016-10-06 NOTE — Telephone Encounter (Signed)
Called and spoke with Pt, she was just coming our of a surgical procedure. States she sees Dr. Minna Merritts, at [facility] cardiology [phone]). Questions if we could call and ask if she needs to be seen of if he would place the order. Reports she was seen in office "not too long ago." Called and left message with nurse for review. Direct callback information provided.  Pt also questions if PCP found out if she should start the gabapentin (NEURONTIN) 100 MG capsule Rx. Routing for review.

## 2016-10-06 NOTE — Telephone Encounter (Signed)
Called to get update from EP lab [phone]), was advised this order must come from the Cardiology office.

## 2016-10-06 NOTE — Telephone Encounter (Signed)
Yes, let have her start the gabapentin.

## 2016-10-07 ENCOUNTER — Ambulatory Visit (HOSPITAL_BASED_OUTPATIENT_CLINIC_OR_DEPARTMENT_OTHER)
Admission: RE | Admit: 2016-10-07 | Discharge: 2016-10-07 | Disposition: A | Discharge status: Home / Self Care | Payer: Medicare Other | Source: Ambulatory Visit | Attending: Sports Medicine | Admitting: Sports Medicine

## 2016-10-07 DIAGNOSIS — J45909 Unspecified asthma, uncomplicated: Secondary | ICD-10-CM | POA: Diagnosis not present

## 2016-10-07 DIAGNOSIS — R109 Unspecified abdominal pain: Secondary | ICD-10-CM | POA: Diagnosis not present

## 2016-10-07 DIAGNOSIS — Z885 Allergy status to narcotic agent status: Secondary | ICD-10-CM | POA: Diagnosis not present

## 2016-10-07 DIAGNOSIS — M542 Cervicalgia: Secondary | ICD-10-CM | POA: Diagnosis not present

## 2016-10-07 DIAGNOSIS — M4722 Other spondylosis with radiculopathy, cervical region: Secondary | ICD-10-CM | POA: Insufficient documentation

## 2016-10-07 DIAGNOSIS — Z888 Allergy status to other drugs, medicaments and biological substances status: Secondary | ICD-10-CM | POA: Diagnosis not present

## 2016-10-07 DIAGNOSIS — M5030 Other cervical disc degeneration, unspecified cervical region: Secondary | ICD-10-CM | POA: Insufficient documentation

## 2016-10-07 DIAGNOSIS — M797 Fibromyalgia: Secondary | ICD-10-CM | POA: Diagnosis not present

## 2016-10-07 DIAGNOSIS — Z880 Allergy status to penicillin: Secondary | ICD-10-CM | POA: Diagnosis not present

## 2016-10-07 DIAGNOSIS — E785 Hyperlipidemia, unspecified: Secondary | ICD-10-CM | POA: Diagnosis not present

## 2016-10-07 DIAGNOSIS — M4802 Spinal stenosis, cervical region: Secondary | ICD-10-CM | POA: Insufficient documentation

## 2016-10-07 DIAGNOSIS — G35 Multiple sclerosis: Secondary | ICD-10-CM | POA: Diagnosis not present

## 2016-10-07 DIAGNOSIS — Z8249 Family history of ischemic heart disease and other diseases of the circulatory system: Secondary | ICD-10-CM | POA: Diagnosis not present

## 2016-10-07 DIAGNOSIS — Z87891 Personal history of nicotine dependence: Secondary | ICD-10-CM | POA: Diagnosis not present

## 2016-10-07 DIAGNOSIS — R05 Cough: Secondary | ICD-10-CM | POA: Diagnosis not present

## 2016-10-07 DIAGNOSIS — R0602 Shortness of breath: Secondary | ICD-10-CM | POA: Diagnosis not present

## 2016-10-07 DIAGNOSIS — D649 Anemia, unspecified: Secondary | ICD-10-CM | POA: Diagnosis not present

## 2016-10-07 DIAGNOSIS — G4730 Sleep apnea, unspecified: Secondary | ICD-10-CM | POA: Diagnosis not present

## 2016-10-07 DIAGNOSIS — I10 Essential (primary) hypertension: Secondary | ICD-10-CM | POA: Diagnosis not present

## 2016-10-07 DIAGNOSIS — Z886 Allergy status to analgesic agent status: Secondary | ICD-10-CM | POA: Diagnosis not present

## 2016-10-07 DIAGNOSIS — R42 Dizziness and giddiness: Secondary | ICD-10-CM | POA: Diagnosis not present

## 2016-10-07 DIAGNOSIS — Z9289 Personal history of other medical treatment: Secondary | ICD-10-CM | POA: Diagnosis not present

## 2016-10-09 ENCOUNTER — Encounter: Payer: Self-pay | Admitting: Family Medicine

## 2016-10-16 ENCOUNTER — Encounter: Payer: Self-pay | Admitting: Family Medicine

## 2016-10-16 ENCOUNTER — Ambulatory Visit (INDEPENDENT_AMBULATORY_CARE_PROVIDER_SITE_OTHER): Payer: Medicare Other | Admitting: Family Medicine

## 2016-10-16 VITALS — BP 138/101 | HR 116 | Temp 98.5°F | Wt 206.0 lb

## 2016-10-16 DIAGNOSIS — R220 Localized swelling, mass and lump, head: Secondary | ICD-10-CM | POA: Diagnosis not present

## 2016-10-16 DIAGNOSIS — R21 Rash and other nonspecific skin eruption: Secondary | ICD-10-CM | POA: Diagnosis not present

## 2016-10-16 MED ORDER — DIAZEPAM 2 MG PO TABS: 2.0000 mg | ORAL_TABLET | Freq: Every evening | ORAL | 0 refills | Status: DC | PRN

## 2016-10-16 MED ORDER — DEXAMETHASONE SODIUM PHOSPHATE 4 MG/ML IJ SOLN
2.0000 mg | Freq: Once | INTRAMUSCULAR | Status: AC
  Administered 2016-10-16: 2 mg via INTRAMUSCULAR
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
## 2016-10-17 ENCOUNTER — Telehealth: Payer: Self-pay

## 2016-10-17 NOTE — Telephone Encounter (Signed)
Pt reports that she had eaten mushrooms when she had the allergic reaction.  She is asking that you test her for a mushroom allergy. She also asked would the test tell her the type of mushroom she is allergic to? She would like for this to be faxed over to [facility]. Please advise.

## 2016-10-17 NOTE — Telephone Encounter (Signed)
Will need to call the lab and see if food panel I ordered includes mushroom. I am not sure.  If not then can order mushroom antibody.

## 2016-10-18 ENCOUNTER — Other Ambulatory Visit: Payer: Self-pay

## 2016-10-18 DIAGNOSIS — R220 Localized swelling, mass and lump, head: Secondary | ICD-10-CM

## 2016-10-18 NOTE — Telephone Encounter (Signed)
Rx was already at [facility] for Valium. Allergy labs faxed to [facility] on 10/18/2016.

## 2016-10-19 ENCOUNTER — Telehealth: Payer: Self-pay | Admitting: Family Medicine

## 2016-10-19 ENCOUNTER — Emergency Department (HOSPITAL_BASED_OUTPATIENT_CLINIC_OR_DEPARTMENT_OTHER): Payer: Medicare Other

## 2016-10-19 ENCOUNTER — Encounter (HOSPITAL_BASED_OUTPATIENT_CLINIC_OR_DEPARTMENT_OTHER): Payer: Self-pay | Admitting: *Deleted

## 2016-10-19 ENCOUNTER — Other Ambulatory Visit: Payer: Self-pay

## 2016-10-19 ENCOUNTER — Ambulatory Visit: Payer: Medicare Other | Admitting: Allergy and Immunology

## 2016-10-19 ENCOUNTER — Emergency Department (HOSPITAL_BASED_OUTPATIENT_CLINIC_OR_DEPARTMENT_OTHER)
Admission: EM | Admit: 2016-10-19 | Discharge: 2016-10-19 | Disposition: A | Discharge status: Home / Self Care | Payer: Medicare Other | Attending: Emergency Medicine | Admitting: Emergency Medicine

## 2016-10-19 DIAGNOSIS — R079 Chest pain, unspecified: Secondary | ICD-10-CM | POA: Insufficient documentation

## 2016-10-19 DIAGNOSIS — Y9389 Activity, other specified: Secondary | ICD-10-CM | POA: Insufficient documentation

## 2016-10-19 DIAGNOSIS — E876 Hypokalemia: Secondary | ICD-10-CM | POA: Diagnosis not present

## 2016-10-19 DIAGNOSIS — I10 Essential (primary) hypertension: Secondary | ICD-10-CM | POA: Diagnosis not present

## 2016-10-19 DIAGNOSIS — R0789 Other chest pain: Secondary | ICD-10-CM | POA: Diagnosis not present

## 2016-10-19 DIAGNOSIS — Y999 Unspecified external cause status: Secondary | ICD-10-CM | POA: Insufficient documentation

## 2016-10-19 DIAGNOSIS — R220 Localized swelling, mass and lump, head: Secondary | ICD-10-CM | POA: Diagnosis not present

## 2016-10-19 DIAGNOSIS — S299XXA Unspecified injury of thorax, initial encounter: Secondary | ICD-10-CM | POA: Diagnosis not present

## 2016-10-19 DIAGNOSIS — W228XXA Striking against or struck by other objects, initial encounter: Secondary | ICD-10-CM | POA: Insufficient documentation

## 2016-10-19 DIAGNOSIS — R21 Rash and other nonspecific skin eruption: Secondary | ICD-10-CM | POA: Diagnosis not present

## 2016-10-19 DIAGNOSIS — Y929 Unspecified place or not applicable: Secondary | ICD-10-CM | POA: Insufficient documentation

## 2016-10-19 DIAGNOSIS — J45909 Unspecified asthma, uncomplicated: Secondary | ICD-10-CM | POA: Insufficient documentation

## 2016-10-19 DIAGNOSIS — Z87891 Personal history of nicotine dependence: Secondary | ICD-10-CM | POA: Insufficient documentation

## 2016-10-19 LAB — CBC AND DIFFERENTIAL
HCT: 45 % (ref 36–46)
Hemoglobin: 14.7 g/dL (ref 12.0–16.0)
Neutrophils Absolute: 4 /uL
Platelets: 218 10*3/uL (ref 150–399)
WBC: 5.4 10^3/mL

## 2016-10-19 NOTE — Discharge Instructions (Signed)
Your chest x-ray does not show broken bone or lung injury.  Take ibuprofen, tylenol and ice packs. This can last for 1-2 weeks before fully better  Return for worsening symptoms, including fever, difficulty breathing, passing out or any other symptoms concerning to you

## 2016-10-19 NOTE — Telephone Encounter (Signed)
Call pt: blood count is normal. Hemoglobin looks great!   Jennifer Lecher, MD
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
## 2016-10-19 NOTE — ED Triage Notes (Signed)
2 days ago she hit her left chest on the leg of a toddler bed. Bruising noted.

## 2016-10-20 ENCOUNTER — Ambulatory Visit: Payer: Self-pay | Admitting: Sports Medicine

## 2016-10-20 ENCOUNTER — Ambulatory Visit (INDEPENDENT_AMBULATORY_CARE_PROVIDER_SITE_OTHER): Payer: Medicare Other | Admitting: Family Medicine

## 2016-10-20 ENCOUNTER — Encounter: Payer: Self-pay | Admitting: Family Medicine

## 2016-10-20 VITALS — BP 143/77 | HR 109 | Ht 62.0 in | Wt 205.0 lb

## 2016-10-20 DIAGNOSIS — R220 Localized swelling, mass and lump, head: Secondary | ICD-10-CM

## 2016-10-20 DIAGNOSIS — I10 Essential (primary) hypertension: Secondary | ICD-10-CM

## 2016-10-20 DIAGNOSIS — M797 Fibromyalgia: Secondary | ICD-10-CM | POA: Diagnosis not present

## 2016-10-20 DIAGNOSIS — F339 Major depressive disorder, recurrent, unspecified: Secondary | ICD-10-CM | POA: Diagnosis not present
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
## 2016-10-20 NOTE — Telephone Encounter (Signed)
Pt informed at [place] .Jennifer [name] Sigurd

## 2016-10-20 NOTE — Patient Instructions (Addendum)
Make sure taking MiraLAX daily to keep bowels moving. Make sure drink plenty of water. Call your insurance and check on the co-pay for a behavioral health visit. If it's reasonable to can always work on a referral.

## 2016-10-21 IMAGING — DX DG CHEST 2V
2 series · 2 of 2 positions shown · non-contrast
Comparison: 07/06/2015

CLINICAL DATA: Cough, hoarseness for several weeks

EXAM:
CHEST  2 VIEW

[chest pa]
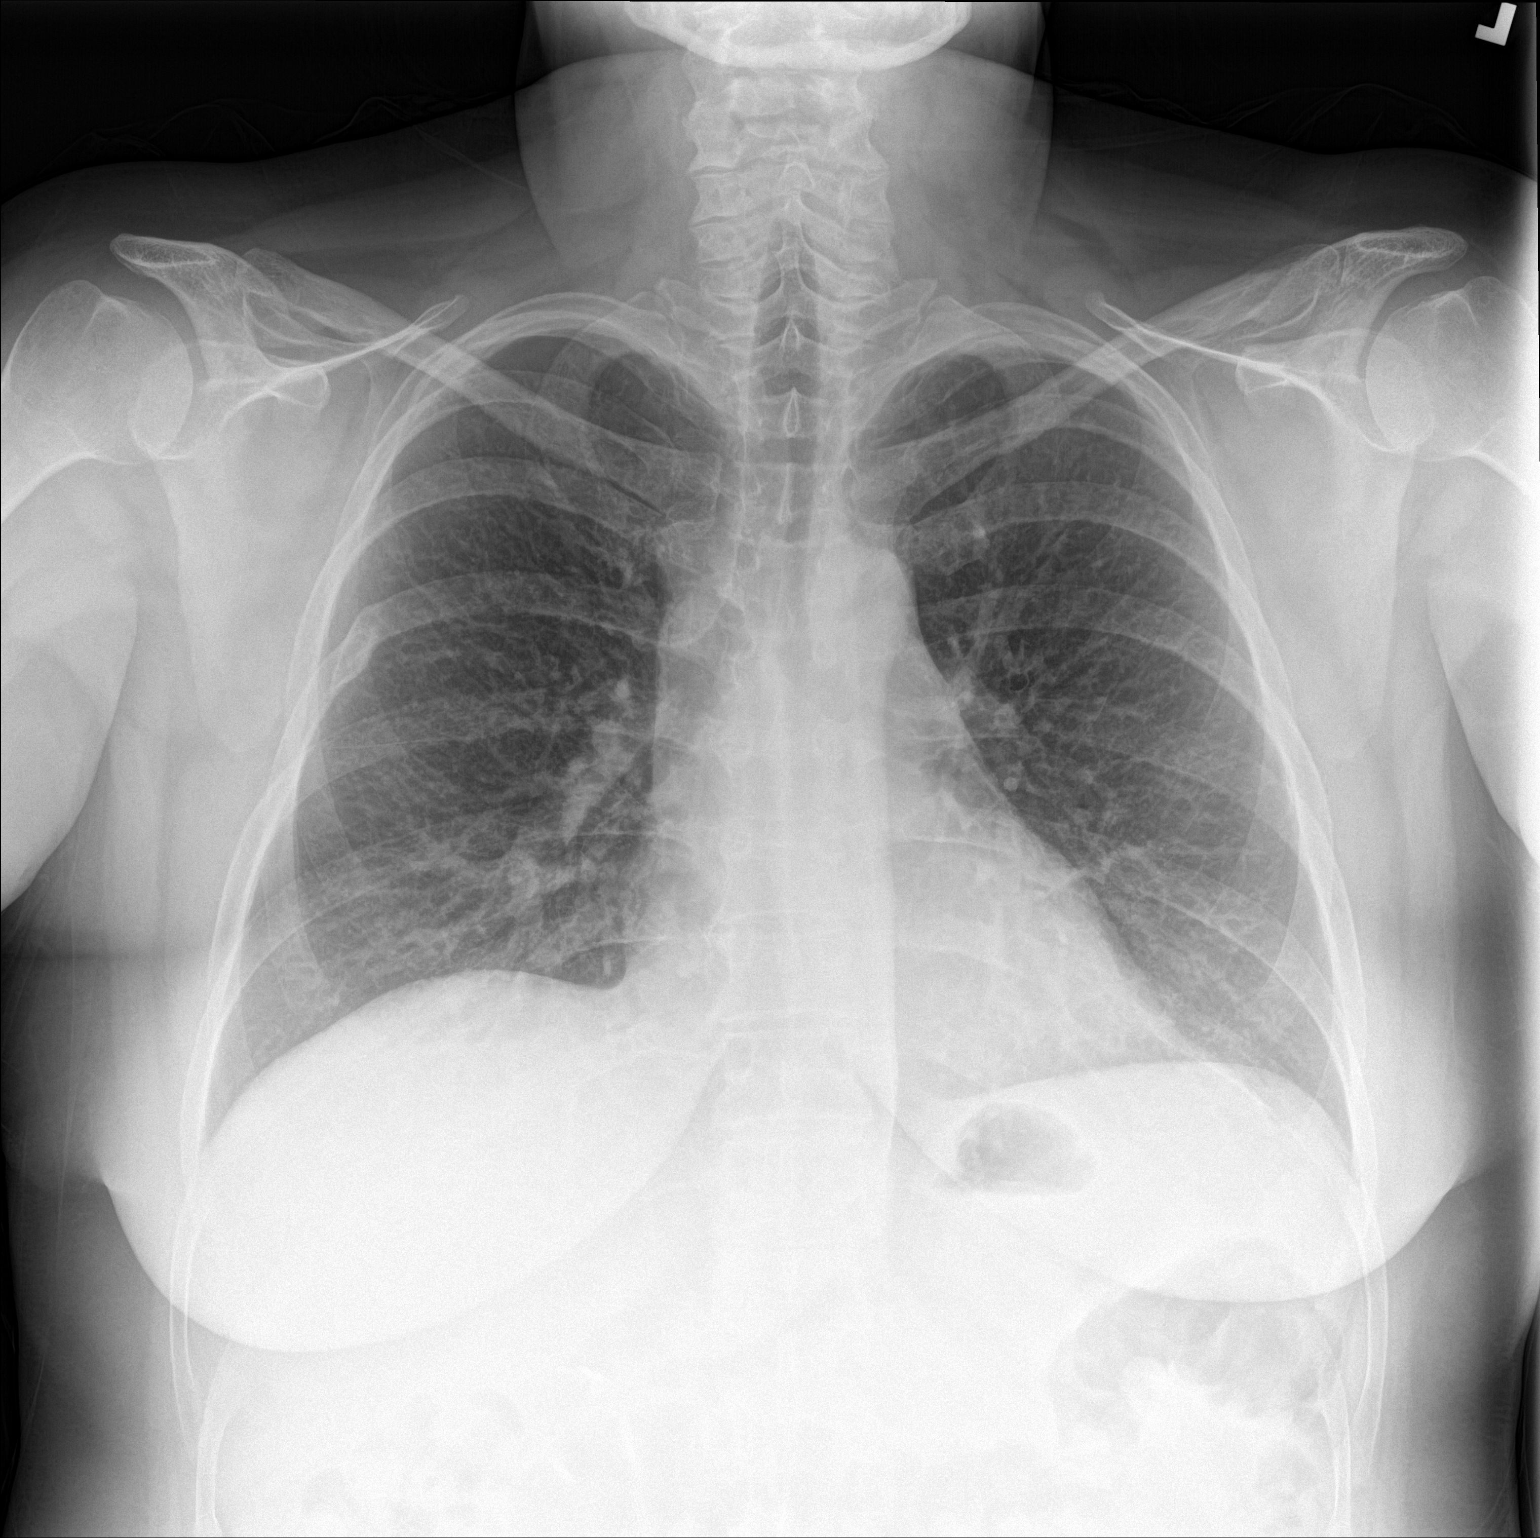

[chest lat]
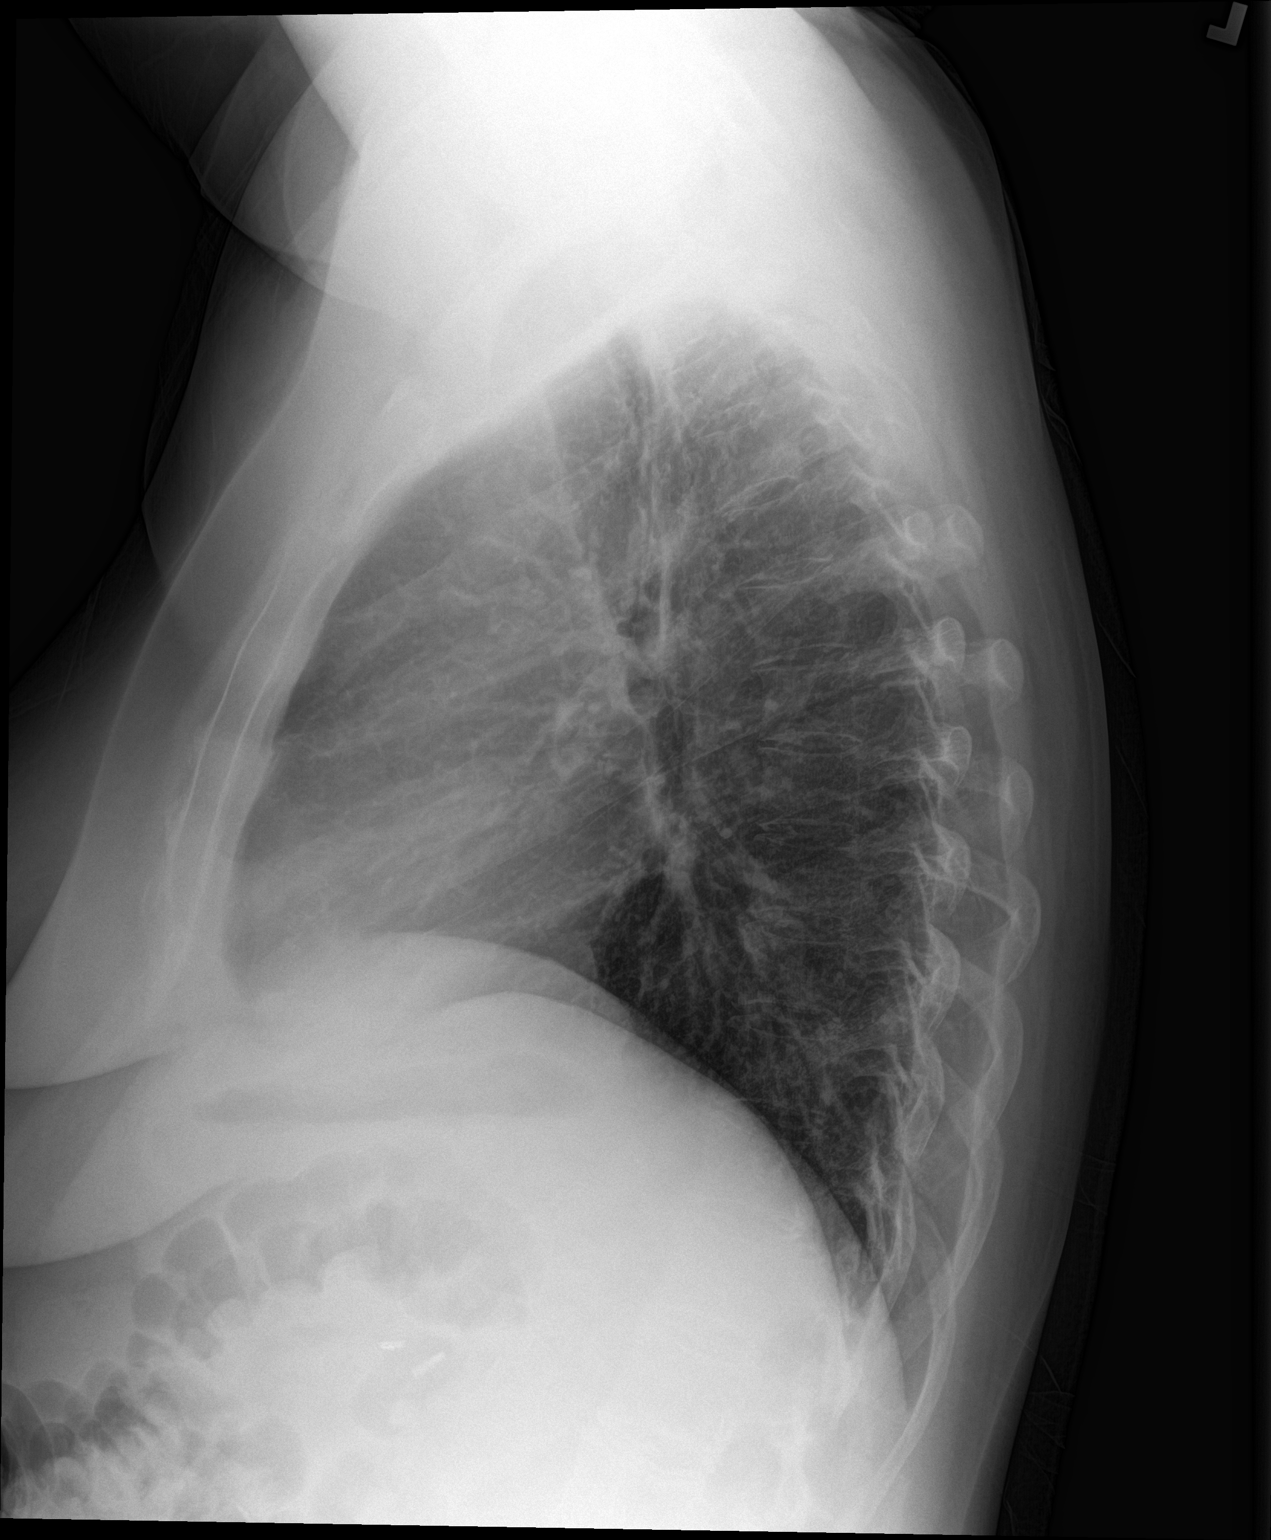

[2 of 2 positions shown; findings below may reference images not displayed]

FINDINGS: Cardiomediastinal silhouette is stable. No acute infiltrate or
pleural effusion. No pulmonary edema. Old fracture of the right
sixth rib again noted.
IMPRESSION: No active cardiopulmonary disease.

## 2016-10-23 ENCOUNTER — Encounter: Payer: Self-pay | Admitting: Family Medicine

## 2016-10-24 ENCOUNTER — Telehealth: Payer: Self-pay | Admitting: Family Medicine

## 2016-10-24 ENCOUNTER — Encounter: Payer: Self-pay | Admitting: Family Medicine

## 2016-10-24 DIAGNOSIS — M79602 Pain in left arm: Secondary | ICD-10-CM | POA: Diagnosis not present

## 2016-10-24 DIAGNOSIS — G4730 Sleep apnea, unspecified: Secondary | ICD-10-CM | POA: Diagnosis not present

## 2016-10-24 DIAGNOSIS — D649 Anemia, unspecified: Secondary | ICD-10-CM | POA: Diagnosis not present

## 2016-10-24 DIAGNOSIS — R0602 Shortness of breath: Secondary | ICD-10-CM | POA: Diagnosis not present

## 2016-10-24 DIAGNOSIS — J45909 Unspecified asthma, uncomplicated: Secondary | ICD-10-CM | POA: Diagnosis not present

## 2016-10-24 DIAGNOSIS — I10 Essential (primary) hypertension: Secondary | ICD-10-CM | POA: Diagnosis not present

## 2016-10-24 DIAGNOSIS — G35 Multiple sclerosis: Secondary | ICD-10-CM | POA: Diagnosis not present

## 2016-10-24 DIAGNOSIS — Z886 Allergy status to analgesic agent status: Secondary | ICD-10-CM | POA: Diagnosis not present

## 2016-10-24 DIAGNOSIS — R05 Cough: Secondary | ICD-10-CM | POA: Diagnosis not present

## 2016-10-24 DIAGNOSIS — Z9289 Personal history of other medical treatment: Secondary | ICD-10-CM | POA: Diagnosis not present

## 2016-10-24 DIAGNOSIS — Z880 Allergy status to penicillin: Secondary | ICD-10-CM | POA: Diagnosis not present

## 2016-10-24 DIAGNOSIS — Z885 Allergy status to narcotic agent status: Secondary | ICD-10-CM | POA: Diagnosis not present

## 2016-10-24 DIAGNOSIS — Z888 Allergy status to other drugs, medicaments and biological substances status: Secondary | ICD-10-CM | POA: Diagnosis not present

## 2016-10-24 DIAGNOSIS — I517 Cardiomegaly: Secondary | ICD-10-CM | POA: Diagnosis not present

## 2016-10-24 DIAGNOSIS — S40012A Contusion of left shoulder, initial encounter: Secondary | ICD-10-CM | POA: Diagnosis not present

## 2016-10-24 DIAGNOSIS — M791 Myalgia: Secondary | ICD-10-CM | POA: Diagnosis not present

## 2016-10-24 DIAGNOSIS — Z8249 Family history of ischemic heart disease and other diseases of the circulatory system: Secondary | ICD-10-CM | POA: Diagnosis not present

## 2016-10-24 DIAGNOSIS — E785 Hyperlipidemia, unspecified: Secondary | ICD-10-CM | POA: Diagnosis not present

## 2016-10-24 NOTE — Telephone Encounter (Signed)
Pt called office to see if she was able to have milk now, reports latest allergen tested showed she was not allergic.  Pt does state the swelling in her tongue has come back. She is currently at the [facility] in [place] for eval. Questions what could be causing this. Pt has an appt tomorrow with allergist.

## 2016-10-24 NOTE — Telephone Encounter (Signed)
Pt advised.

## 2016-10-24 NOTE — Telephone Encounter (Signed)
I would ask the allergist before starting back on milk.

## 2016-10-25 ENCOUNTER — Telehealth: Payer: Self-pay | Admitting: *Deleted

## 2016-10-25 ENCOUNTER — Encounter: Payer: Self-pay | Admitting: Allergy and Immunology

## 2016-10-25 ENCOUNTER — Ambulatory Visit (INDEPENDENT_AMBULATORY_CARE_PROVIDER_SITE_OTHER): Payer: Medicare Other | Admitting: Allergy and Immunology

## 2016-10-25 DIAGNOSIS — J3089 Other allergic rhinitis: Secondary | ICD-10-CM

## 2016-10-25 DIAGNOSIS — T783XXA Angioneurotic edema, initial encounter: Secondary | ICD-10-CM | POA: Diagnosis not present

## 2016-10-25 DIAGNOSIS — T7840XA Allergy, unspecified, initial encounter: Secondary | ICD-10-CM

## 2016-10-25 DIAGNOSIS — J4520 Mild intermittent asthma, uncomplicated: Secondary | ICD-10-CM

## 2016-10-25 MED ORDER — LEVALBUTEROL TARTRATE 45 MCG/ACT IN AERO: 2.0000 | INHALATION_SPRAY | Freq: Four times a day (QID) | RESPIRATORY_TRACT | 5 refills | Status: DC | PRN

## 2016-10-25 MED ORDER — EPINEPHRINE 0.3 MG/0.3ML IJ SOAJ: 0.3000 mg | INTRAMUSCULAR | 1 refills | Status: DC | PRN

## 2016-10-25 NOTE — Assessment & Plan Note (Signed)
Intermittent, with vocal cord dysfunction.  Spirometry today reveals normal ventilatory function.  She will return to pulmonology for methacholine challenge to clarify the etiology.  For now, continue albuterol HFA, 1-2 inhalations every 4-6 hours as needed.
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
## 2016-10-25 NOTE — Telephone Encounter (Signed)
lvm informing lab that the wrong labs were ordered on pt and that Dr. Madilyn Fireman had ordered food panel IgG nog IgE and that this will need to be corrected. Asked that if they have any questions to rtn call.[name] [place]

## 2016-10-25 NOTE — Assessment & Plan Note (Signed)
Stable.  Continue appropriate allergen avoidance measures, Dymista as needed, and nasal saline irrigation as needed.

## 2016-10-25 NOTE — Assessment & Plan Note (Signed)
Mild tongue swelling with unclear etiology.  Food allergen skin testing was negative today despite a positive histamine control.  The patient is not taking an ACE inhibitor. NSAIDs may exacerbate angioedema but in this case are not the underlying etiology as demonstrated by the fact that the patient has experienced angioedema in the absence of NSAIDs. There are no concomitant symptoms concerning for anaphylaxis or constitutional symptoms worrisome for an underlying malignancy. Will order labs to rule out hereditary angioedema, acquired angioedema, urticaria associated angioedema, and other potential etiologies.   The following labs have been ordered: tryptase, C4, C1 esterase inhibitor (quantitative and functional), C1q, factor XII, and galactose-alpha-1,3-galactose IgE level.  The patient will be notified with further recommendations after lab results have returned.  Should symptoms recur, a  journal is to be kept recording any foods eaten, beverages consumed, medications taken, activities performed, and environmental conditions within a 6 hour period prior to the onset of symptoms. For any symptoms concerning for anaphylaxis, 911 is to be called immediately.

## 2016-10-25 NOTE — Patient Instructions (Addendum)
Angioedema Mild tongue swelling with unclear etiology.  Food allergen skin testing was negative today despite a positive histamine control.  The patient is not taking an ACE inhibitor. NSAIDs may exacerbate angioedema but in this case are not the underlying etiology as demonstrated by the fact that the patient has experienced angioedema in the absence of NSAIDs. There are no concomitant symptoms concerning for anaphylaxis or constitutional symptoms worrisome for an underlying malignancy. Will order labs to rule out hereditary angioedema, acquired angioedema, urticaria associated angioedema, and other potential etiologies.   The following labs have been ordered: tryptase, C4, C1 esterase inhibitor (quantitative and functional), C1q, factor XII, and galactose-alpha-1,3-galactose IgE level.  The patient will be notified with further recommendations after lab results have returned.  Should symptoms recur, a  journal is to be kept recording any foods eaten, beverages consumed, medications taken, activities performed, and environmental conditions within a 6 hour period prior to the onset of symptoms. For any symptoms concerning for anaphylaxis, 911 is to be called immediately.  Mild intermittent asthma Intermittent, with vocal cord dysfunction.  Spirometry today reveals normal ventilatory function.  She will return to pulmonology for methacholine challenge to clarify the etiology.  For now, continue albuterol HFA, 1-2 inhalations every 4-6 hours as needed.  Other allergic rhinitis Stable.  Continue appropriate allergen avoidance measures, Dymista as needed, and nasal saline irrigation as needed.   When lab results have returned the patient will be called with further recommendations and follow up instructions.

## 2016-10-26 IMAGING — US US EXTREM  UP VENOUS*R*
1 series · 13 of 24 positions shown · non-contrast
Comparison: None.

CLINICAL DATA: Right arm pain and swelling.



[Series 1: us extrem up venous*right* · 0.11mm/px · 13 of 32 slices shown]
[im 1/32]
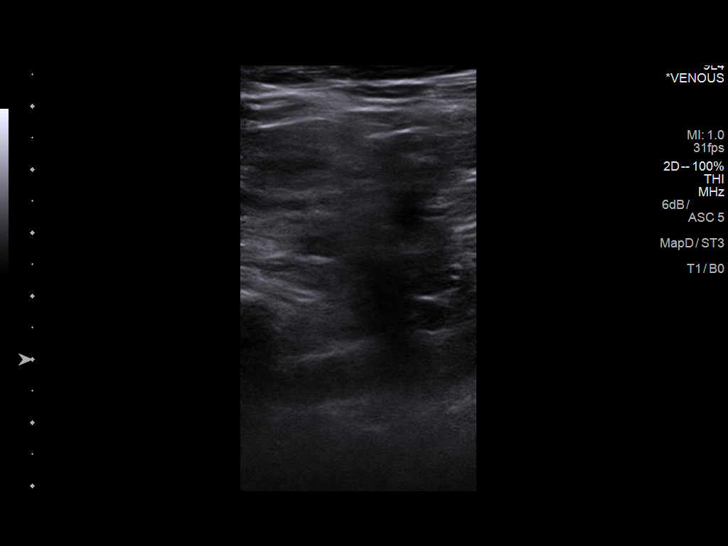
[im 3/32]
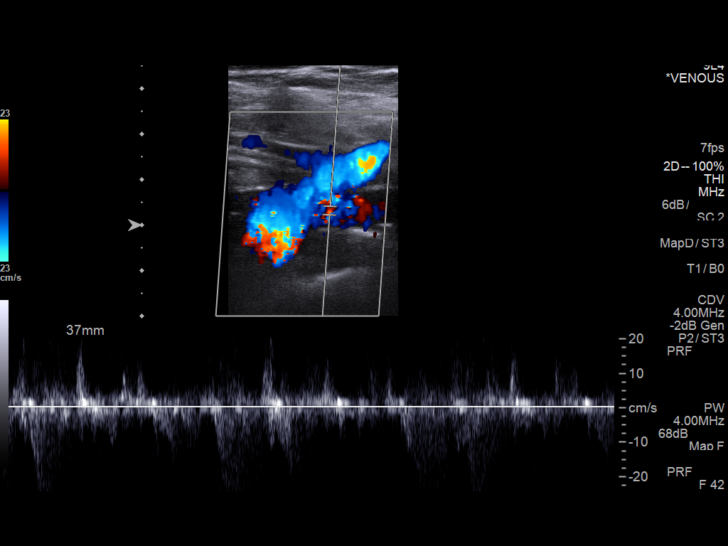
[im 6/32]
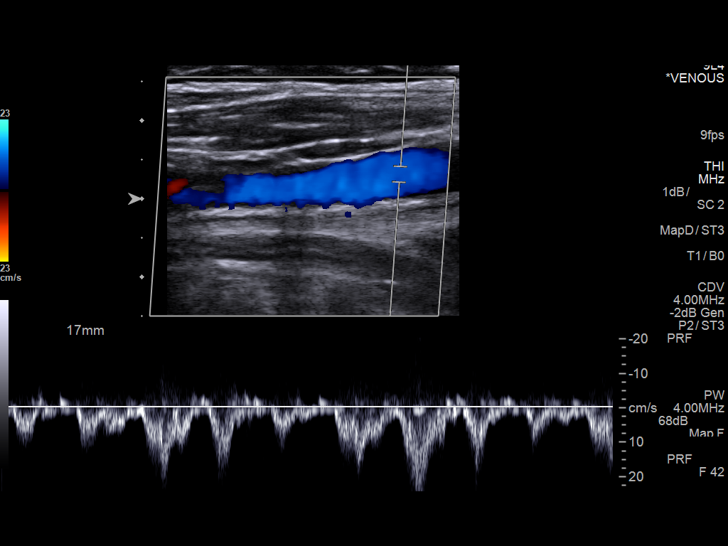
[im 9/32]
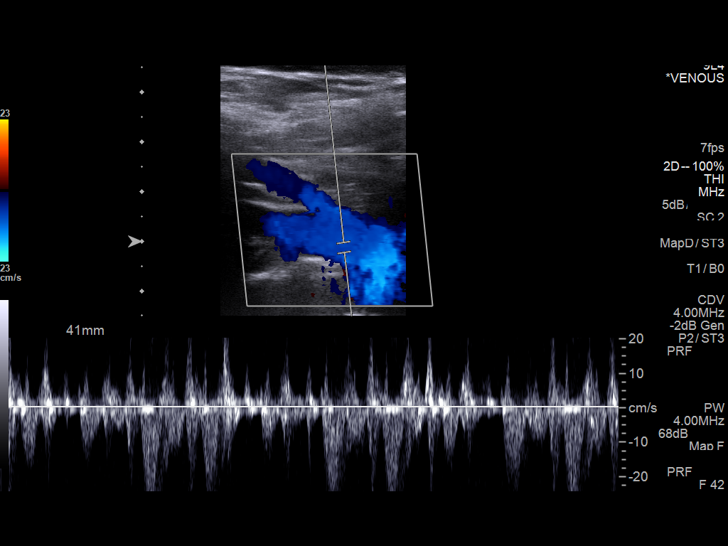
[im 11/32]
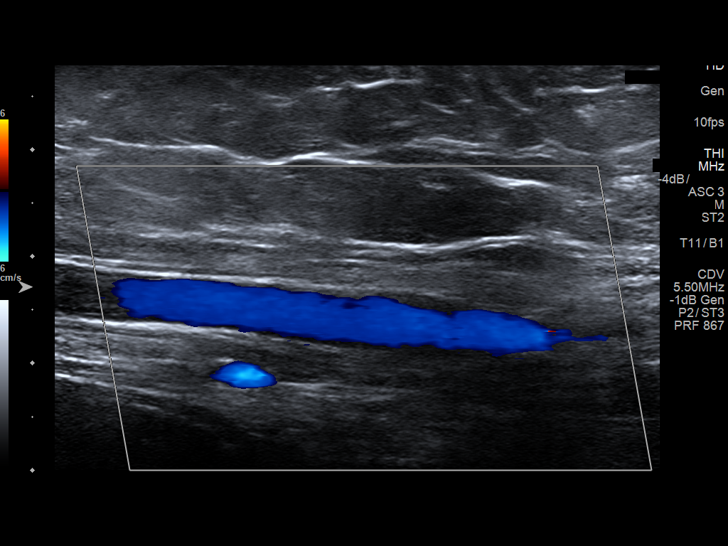
[im 14/32]
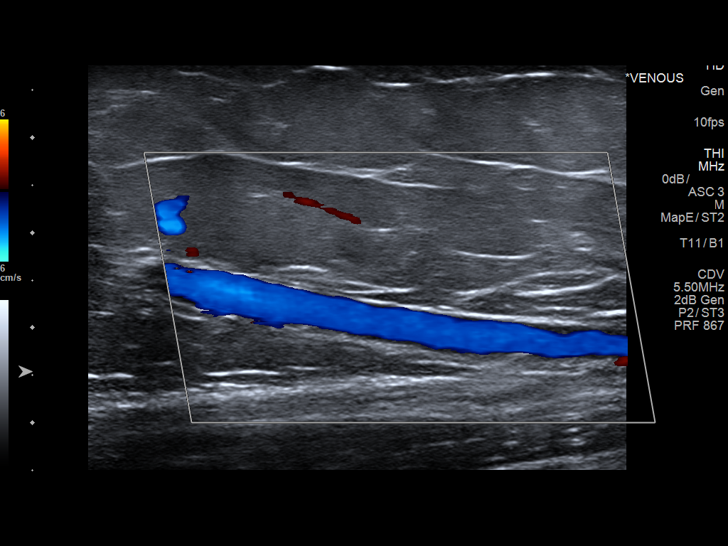
[im 17/32]
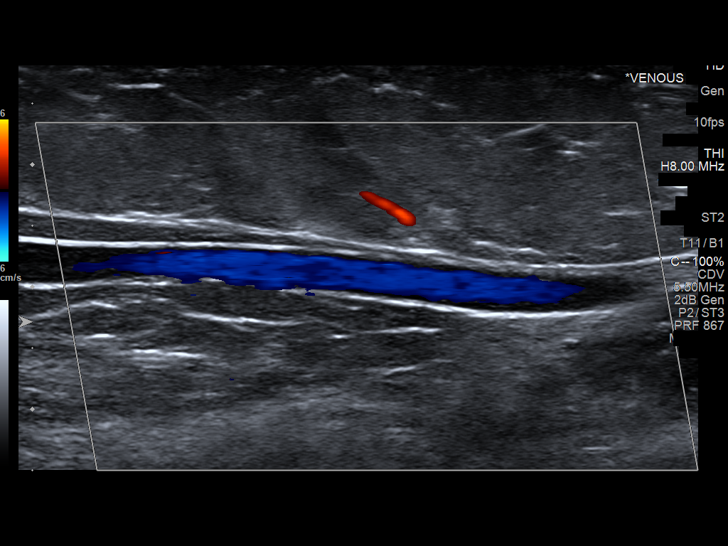
[im 18/32]
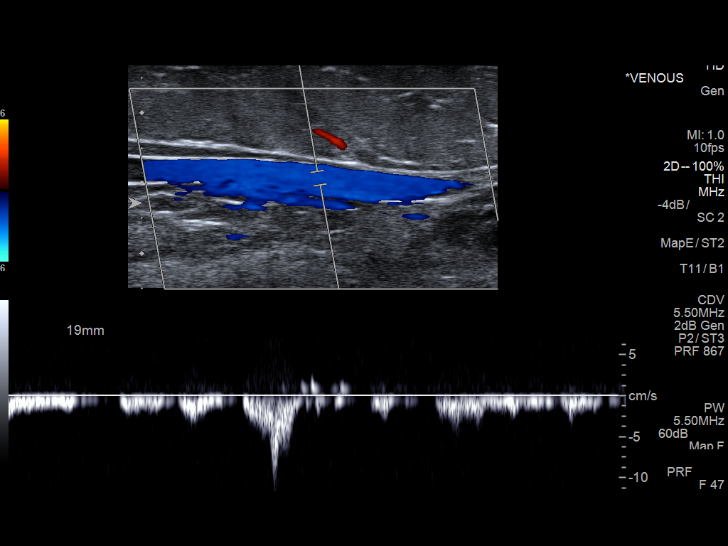
[im 21/32]
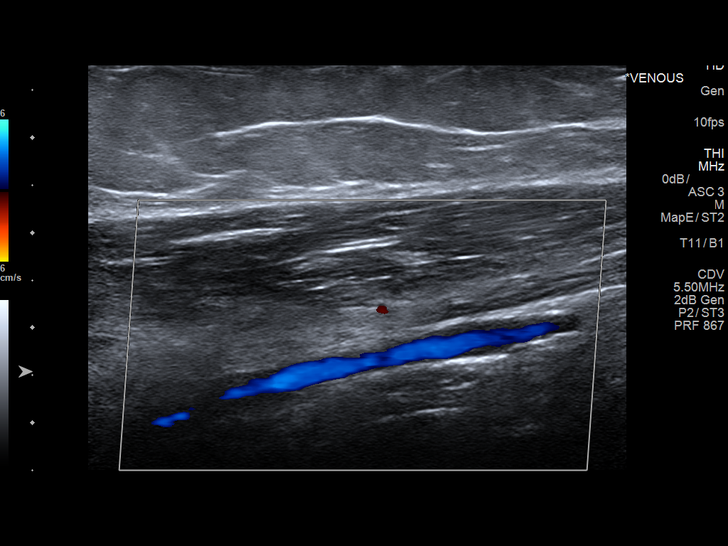
[im 23/32]
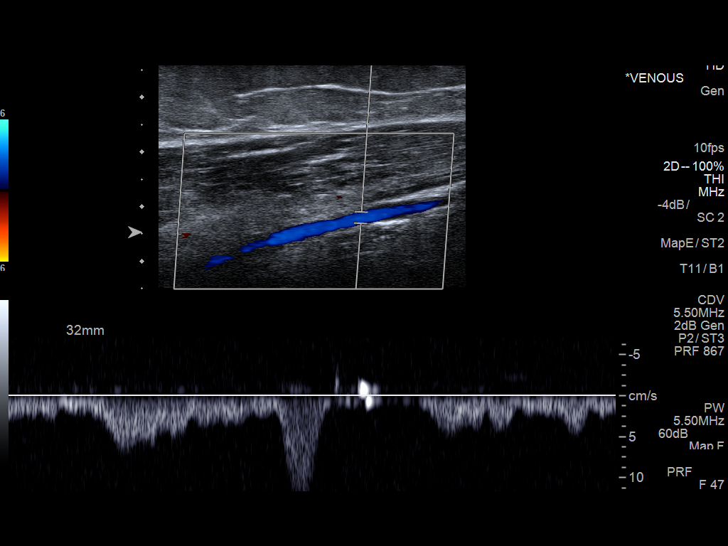
[im 26/32]
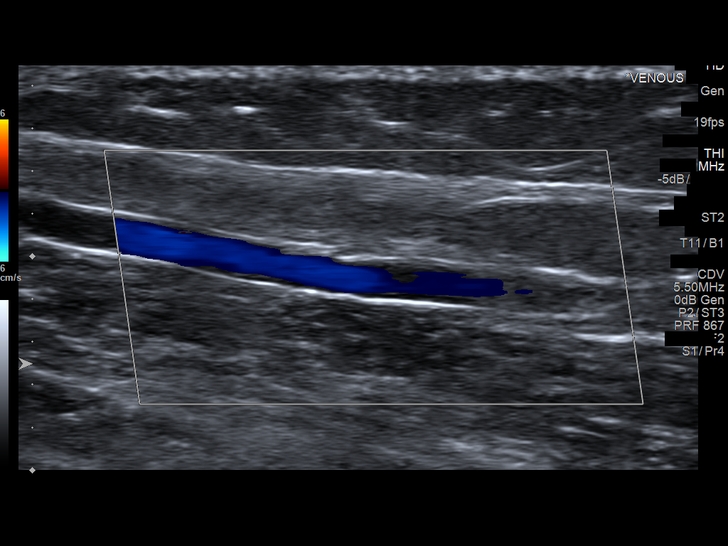
[im 29/32]
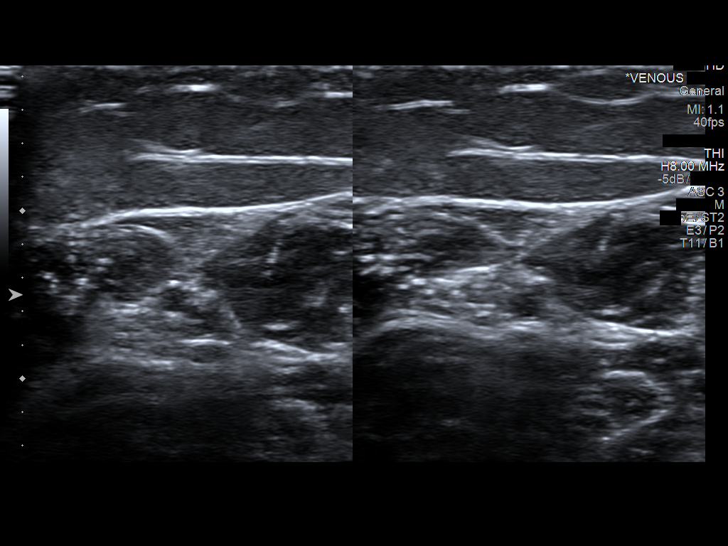
[im 32/32]
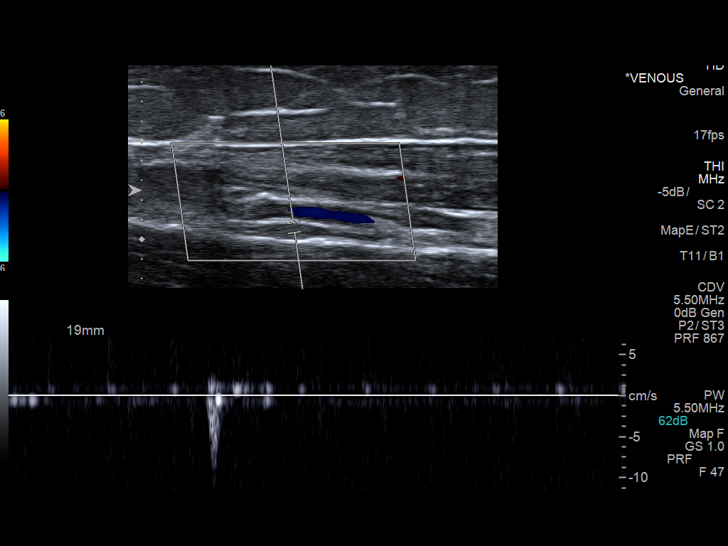

[13 of 24 positions shown; findings below may reference images not displayed]

FINDINGS: Contralateral Subclavian Vein: Respiratory phasicity is normal and
symmetric with the symptomatic side. No evidence of thrombus. Normal
compressibility.

Internal Jugular Vein: No evidence of thrombus. Normal
compressibility, respiratory phasicity and response to augmentation.

Subclavian Vein: No evidence of thrombus. Normal compressibility,
respiratory phasicity and response to augmentation.

Axillary Vein: No evidence of thrombus. Normal compressibility,
respiratory phasicity and response to augmentation.

Cephalic Vein: No evidence of thrombus. Normal compressibility,
respiratory phasicity and response to augmentation.

Basilic Vein: No evidence of thrombus. Normal compressibility,
respiratory phasicity and response to augmentation.

Brachial Veins: No evidence of thrombus. Normal compressibility,
respiratory phasicity and response to augmentation.

Radial Veins: No evidence of thrombus. Normal compressibility,
respiratory phasicity and response to augmentation.

Ulnar Veins: No evidence of thrombus. Normal compressibility,
respiratory phasicity and response to augmentation.

Venous Reflux:  None visualized.

Other Findings:  None visualized.
IMPRESSION: No evidence of deep venous thrombosis.

## 2016-10-26 NOTE — Telephone Encounter (Signed)
Received callback from [facility] with lab at [facility]. They have corrected the lab error and sent out the IgG for processing. Will credit account for incorrect lab that was processed.

## 2016-10-27 DIAGNOSIS — G35 Multiple sclerosis: Secondary | ICD-10-CM | POA: Diagnosis not present

## 2016-10-27 DIAGNOSIS — Z8249 Family history of ischemic heart disease and other diseases of the circulatory system: Secondary | ICD-10-CM | POA: Diagnosis not present

## 2016-10-27 DIAGNOSIS — K0510 Chronic gingivitis, plaque induced: Secondary | ICD-10-CM | POA: Diagnosis not present

## 2016-10-27 DIAGNOSIS — Z880 Allergy status to penicillin: Secondary | ICD-10-CM | POA: Diagnosis not present

## 2016-10-27 DIAGNOSIS — Z87891 Personal history of nicotine dependence: Secondary | ICD-10-CM | POA: Diagnosis not present

## 2016-10-27 DIAGNOSIS — E785 Hyperlipidemia, unspecified: Secondary | ICD-10-CM | POA: Diagnosis not present

## 2016-10-27 DIAGNOSIS — Z9289 Personal history of other medical treatment: Secondary | ICD-10-CM | POA: Diagnosis not present

## 2016-10-27 DIAGNOSIS — R221 Localized swelling, mass and lump, neck: Secondary | ICD-10-CM | POA: Diagnosis not present

## 2016-10-27 DIAGNOSIS — I10 Essential (primary) hypertension: Secondary | ICD-10-CM | POA: Diagnosis not present

## 2016-10-27 DIAGNOSIS — Z886 Allergy status to analgesic agent status: Secondary | ICD-10-CM | POA: Diagnosis not present

## 2016-10-27 DIAGNOSIS — J301 Allergic rhinitis due to pollen: Secondary | ICD-10-CM | POA: Diagnosis not present

## 2016-10-27 DIAGNOSIS — D649 Anemia, unspecified: Secondary | ICD-10-CM | POA: Diagnosis not present

## 2016-10-27 DIAGNOSIS — Z888 Allergy status to other drugs, medicaments and biological substances status: Secondary | ICD-10-CM | POA: Diagnosis not present

## 2016-10-27 DIAGNOSIS — G4730 Sleep apnea, unspecified: Secondary | ICD-10-CM | POA: Diagnosis not present

## 2016-10-27 DIAGNOSIS — M797 Fibromyalgia: Secondary | ICD-10-CM | POA: Diagnosis not present

## 2016-10-27 DIAGNOSIS — Z885 Allergy status to narcotic agent status: Secondary | ICD-10-CM | POA: Diagnosis not present

## 2016-10-30 DIAGNOSIS — I472 Ventricular tachycardia: Secondary | ICD-10-CM | POA: Diagnosis not present

## 2016-10-30 DIAGNOSIS — I471 Supraventricular tachycardia: Secondary | ICD-10-CM | POA: Diagnosis not present

## 2016-10-30 DIAGNOSIS — I10 Essential (primary) hypertension: Secondary | ICD-10-CM | POA: Diagnosis not present

## 2016-10-30 DIAGNOSIS — E269 Hyperaldosteronism, unspecified: Secondary | ICD-10-CM | POA: Diagnosis not present

## 2016-10-30 DIAGNOSIS — G4733 Obstructive sleep apnea (adult) (pediatric): Secondary | ICD-10-CM | POA: Diagnosis not present

## 2016-10-31 ENCOUNTER — Emergency Department (HOSPITAL_BASED_OUTPATIENT_CLINIC_OR_DEPARTMENT_OTHER)
Admission: EM | Admit: 2016-10-31 | Discharge: 2016-10-31 | Disposition: A | Discharge status: Home / Self Care | Payer: Medicare Other | Attending: Emergency Medicine | Admitting: Emergency Medicine

## 2016-10-31 ENCOUNTER — Encounter (HOSPITAL_BASED_OUTPATIENT_CLINIC_OR_DEPARTMENT_OTHER): Payer: Self-pay

## 2016-10-31 DIAGNOSIS — Z79899 Other long term (current) drug therapy: Secondary | ICD-10-CM | POA: Insufficient documentation

## 2016-10-31 DIAGNOSIS — K5909 Other constipation: Secondary | ICD-10-CM | POA: Insufficient documentation

## 2016-10-31 DIAGNOSIS — Z87891 Personal history of nicotine dependence: Secondary | ICD-10-CM | POA: Insufficient documentation

## 2016-10-31 DIAGNOSIS — K219 Gastro-esophageal reflux disease without esophagitis: Secondary | ICD-10-CM | POA: Insufficient documentation

## 2016-10-31 DIAGNOSIS — R1013 Epigastric pain: Secondary | ICD-10-CM | POA: Diagnosis not present

## 2016-10-31 DIAGNOSIS — J45909 Unspecified asthma, uncomplicated: Secondary | ICD-10-CM | POA: Insufficient documentation

## 2016-10-31 LAB — COMPREHENSIVE METABOLIC PANEL
ALT: 20 U/L (ref 14–54)
AST: 16 U/L (ref 15–41)
Albumin: 4 g/dL (ref 3.5–5.0)
Alkaline Phosphatase: 79 U/L (ref 38–126)
Anion gap: 7 (ref 5–15)
BUN: 10 mg/dL (ref 6–20)
CO2: 25 mmol/L (ref 22–32)
Calcium: 9 mg/dL (ref 8.9–10.3)
Chloride: 108 mmol/L (ref 101–111)
Creatinine, Ser: 0.53 mg/dL (ref 0.44–1.00)
GFR calc Af Amer: >60 mL/min (ref >60)
GFR calc non Af Amer: >60 mL/min (ref >60)
Glucose, Bld: 99 mg/dL (ref 65–99)
Potassium: 3.8 mmol/L (ref 3.5–5.1)
Sodium: 140 mmol/L (ref 135–145)
Total Bilirubin: 0.4 mg/dL (ref 0.3–1.2)
Total Protein: 6.8 g/dL (ref 6.5–8.1)

## 2016-10-31 LAB — CBC WITH DIFFERENTIAL/PLATELET
Basophils Absolute: 0 10*3/uL (ref 0.0–0.1)
Basophils Relative: 1 %
Eosinophils Absolute: 0.1 10*3/uL (ref 0.0–0.7)
Eosinophils Relative: 2 %
HCT: 42.2 % (ref 36.0–46.0)
Hemoglobin: 13.9 g/dL (ref 12.0–15.0)
Lymphocytes Relative: 22 %
Lymphs Abs: 1.3 10*3/uL (ref 0.7–4.0)
MCH: 30 pg (ref 26.0–34.0)
MCHC: 32.9 g/dL (ref 30.0–36.0)
MCV: 91.1 fL (ref 78.0–100.0)
Monocytes Absolute: 0.6 10*3/uL (ref 0.1–1.0)
Monocytes Relative: 10 %
Neutro Abs: 3.9 10*3/uL (ref 1.7–7.7)
Neutrophils Relative %: 65 %
Platelets: 192 10*3/uL (ref 150–400)
RBC: 4.63 MIL/uL (ref 3.87–5.11)
RDW: 13.7 % (ref 11.5–15.5)
WBC: 5.9 10*3/uL (ref 4.0–10.5)

## 2016-10-31 LAB — URINALYSIS, ROUTINE W REFLEX MICROSCOPIC
Bilirubin Urine: NEGATIVE
Glucose, UA: NEGATIVE mg/dL
Hgb urine dipstick: NEGATIVE
Ketones, ur: NEGATIVE mg/dL
Leukocytes, UA: NEGATIVE
Nitrite: NEGATIVE
Protein, ur: NEGATIVE mg/dL
Specific Gravity, Urine: 1.019 (ref 1.005–1.030)
pH: 5.5 (ref 5.0–8.0)

## 2016-10-31 LAB — LIPASE, BLOOD: Lipase: 27 U/L (ref 11–51)

## 2016-10-31 MED ORDER — ONDANSETRON HCL 4 MG PO TABS: 4.0000 mg | ORAL_TABLET | Freq: Three times a day (TID) | ORAL | 0 refills | Status: DC | PRN

## 2016-10-31 MED ORDER — GI COCKTAIL ~~LOC~~: 30.0000 mL | Freq: Once | ORAL | Status: DC

## 2016-10-31 MED ORDER — ALUM & MAG HYDROXIDE-SIMETH 200-200-20 MG/5ML PO SUSP
30.0000 mL | Freq: Once | ORAL | Status: AC
  Administered 2016-10-31: 30 mL via ORAL
  Filled 2016-10-31: qty 30

## 2016-10-31 MED ORDER — ONDANSETRON 4 MG PO TBDP
4.0000 mg | ORAL_TABLET | Freq: Once | ORAL | Status: AC
  Administered 2016-10-31: 4 mg via ORAL
  Filled 2016-10-31: qty 1

## 2016-10-31 MED ORDER — LACTULOSE 10 GM/15ML PO SOLN: 10.0000 g | Freq: Two times a day (BID) | ORAL | 0 refills | Status: DC | PRN

## 2016-10-31 MED ORDER — ALUM & MAG HYDROXIDE-SIMETH 400-400-40 MG/5ML PO SUSP: 15.0000 mL | Freq: Four times a day (QID) | ORAL | 0 refills | Status: DC | PRN

## 2016-10-31 NOTE — ED Triage Notes (Addendum)
c/o abd pain x 2-3 days, constipation- n/v x yesterday-NAD-steady gait

## 2016-10-31 NOTE — Discharge Instructions (Signed)
Your blood work is reassuring. Please follow-up with your PCP.  Return without fail for worsening symptoms, including fever, intractable vomiting, escalating pain or any other symptoms concerning to you.
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
## 2016-11-02 ENCOUNTER — Telehealth: Payer: Self-pay

## 2016-11-02 ENCOUNTER — Ambulatory Visit (INDEPENDENT_AMBULATORY_CARE_PROVIDER_SITE_OTHER): Payer: Medicare Other | Admitting: Family Medicine

## 2016-11-02 ENCOUNTER — Encounter: Payer: Self-pay | Admitting: Family Medicine

## 2016-11-02 ENCOUNTER — Ambulatory Visit (INDEPENDENT_AMBULATORY_CARE_PROVIDER_SITE_OTHER): Payer: Medicare Other | Admitting: Sports Medicine

## 2016-11-02 VITALS — BP 133/82 | HR 109 | Ht 62.0 in | Wt 210.0 lb

## 2016-11-02 DIAGNOSIS — Z885 Allergy status to narcotic agent status: Secondary | ICD-10-CM | POA: Diagnosis not present

## 2016-11-02 DIAGNOSIS — Z87891 Personal history of nicotine dependence: Secondary | ICD-10-CM | POA: Diagnosis not present

## 2016-11-02 DIAGNOSIS — R109 Unspecified abdominal pain: Secondary | ICD-10-CM | POA: Diagnosis not present

## 2016-11-02 DIAGNOSIS — Z9889 Other specified postprocedural states: Secondary | ICD-10-CM | POA: Diagnosis not present

## 2016-11-02 DIAGNOSIS — R7303 Prediabetes: Secondary | ICD-10-CM | POA: Diagnosis not present

## 2016-11-02 DIAGNOSIS — K2270 Barrett's esophagus without dysplasia: Secondary | ICD-10-CM | POA: Diagnosis not present

## 2016-11-02 DIAGNOSIS — J45909 Unspecified asthma, uncomplicated: Secondary | ICD-10-CM | POA: Diagnosis not present

## 2016-11-02 DIAGNOSIS — M5030 Other cervical disc degeneration, unspecified cervical region: Secondary | ICD-10-CM

## 2016-11-02 DIAGNOSIS — Z883 Allergy status to other anti-infective agents status: Secondary | ICD-10-CM | POA: Diagnosis not present

## 2016-11-02 DIAGNOSIS — Z91018 Allergy to other foods: Secondary | ICD-10-CM | POA: Diagnosis not present

## 2016-11-02 DIAGNOSIS — I471 Supraventricular tachycardia: Secondary | ICD-10-CM | POA: Diagnosis not present

## 2016-11-02 DIAGNOSIS — M549 Dorsalgia, unspecified: Secondary | ICD-10-CM | POA: Diagnosis not present

## 2016-11-02 DIAGNOSIS — K5900 Constipation, unspecified: Secondary | ICD-10-CM | POA: Diagnosis not present

## 2016-11-02 DIAGNOSIS — I10 Essential (primary) hypertension: Secondary | ICD-10-CM

## 2016-11-02 DIAGNOSIS — K9049 Malabsorption due to intolerance, not elsewhere classified: Secondary | ICD-10-CM

## 2016-11-02 DIAGNOSIS — K5909 Other constipation: Secondary | ICD-10-CM

## 2016-11-02 DIAGNOSIS — R110 Nausea: Secondary | ICD-10-CM | POA: Diagnosis not present

## 2016-11-02 DIAGNOSIS — R1084 Generalized abdominal pain: Secondary | ICD-10-CM | POA: Diagnosis not present

## 2016-11-02 DIAGNOSIS — R569 Unspecified convulsions: Secondary | ICD-10-CM | POA: Diagnosis not present

## 2016-11-02 DIAGNOSIS — Z9861 Coronary angioplasty status: Secondary | ICD-10-CM | POA: Diagnosis not present

## 2016-11-02 DIAGNOSIS — Z882 Allergy status to sulfonamides status: Secondary | ICD-10-CM | POA: Diagnosis not present

## 2016-11-02 DIAGNOSIS — Z888 Allergy status to other drugs, medicaments and biological substances status: Secondary | ICD-10-CM | POA: Diagnosis not present

## 2016-11-02 DIAGNOSIS — E2609 Other primary hyperaldosteronism: Secondary | ICD-10-CM | POA: Diagnosis not present

## 2016-11-02 DIAGNOSIS — Z880 Allergy status to penicillin: Secondary | ICD-10-CM | POA: Diagnosis not present

## 2016-11-02 NOTE — Telephone Encounter (Signed)
Noted  

## 2016-11-02 NOTE — Telephone Encounter (Signed)
Patient called.  States she had labs from [facility] "Food Panel 2 IgG"  on 10/24/16 that showed positive several foods. She said it has Dr. Verlin Fester as ordering physician.  Cannot find an order in Epic for this.  Patient was skin tested to foods in clinic on 10/25/16 that showed negative to all foods. Called the lab at [facility] to fax these result.  Received. Lab result given to Dr. Verlin Fester.  Patient is very worried about "positive foods" and wants Dr. Verlin Fester to review these labs and call her.

## 2016-11-02 NOTE — Telephone Encounter (Addendum)
Per Dr. Verlin Fester, he DID NOT order these labs even though his name is on the lab order.  Called and spoke with Lonn Georgia at [facility] lab.  She said she knew Dr.Catherine Metheney ordered this but she "had to use Dr. Verlin Fester as the ordering doctor because there were already other labs done this day ordered by  Dr. Verlin Fester and she had to use the same doctor to put any additional orders in." I informed Kayla, per Dr. Verlin Fester, this was unacceptable and to have her manager call us when this was resolved and Dr. [name] name is REMOVED from these labs (Food Panel II IgG) ((mushroom IgG) (yeast saccharomyces cerevisiae IgG). Have patient call Dr. Ward Chatters to review this lab as he did not order this. Spoke with patient and she will follow up with Dr. Ward Chatters.

## 2016-11-02 NOTE — Assessment & Plan Note (Signed)
MRI showed some facet arthritis, and disc protrusions, nothing surgical. At this point she is just going to do gabapentin at bedtime, stay active and return to see me as needed.

## 2016-11-02 NOTE — Progress Notes (Signed)
  Subjective:    CC: MRI results  HPI: Jennifer [name] returns, we obtained a cervical spine MRI, she was having neck pain on the right side with occasional radiation down the hand, her radicular symptoms are better, she never actually took her gabapentin 100 mg, principal complaint is only slight pain and lack of range of motion when turning to the right.  Past medical history:  Negative.  See flowsheet/record as well for more information.  Surgical history: Negative.  See flowsheet/record as well for more information.  Family history: Negative.  See flowsheet/record as well for more information.  Social history: Negative.  See flowsheet/record as well for more information.  Allergies, and medications have been entered into the medical record, reviewed, and no changes needed.   Review of Systems: No fevers, chills, night sweats, weight loss, chest pain, or shortness of breath.   Objective:    General: Well Developed, well nourished, and in no acute distress.  Neuro: Alert and oriented x3, extra-ocular muscles intact, sensation grossly intact.  HEENT: Normocephalic, atraumatic, pupils equal round reactive to light, neck supple, no masses, no lymphadenopathy, thyroid nonpalpable.  Skin: Warm and dry, no rashes. Cardiac: Regular rate and rhythm, no murmurs rubs or gallops, no lower extremity edema.  Respiratory: Clear to auscultation bilaterally. Not using accessory muscles, speaking in full sentences.  MRI personally reviewed and shows multilevel spondylosis, relatively mild with some facet arthritis as well as. No overt areas of foraminal stenosis.  Impression and Recommendations:    DDD (degenerative disc disease), cervical MRI showed some facet arthritis, and disc protrusions, nothing surgical. At this point she is just going to do gabapentin at bedtime, stay active and return to see me as needed.  I spent 25 minutes with this patient, greater than 50% was face-to-face time counseling  regarding the above diagnoses

## 2016-11-02 NOTE — Progress Notes (Signed)
Subjective:    CC: HTN  HPI:  Hypertension- Pt denies chest pain, SOB, dizziness, or heart palpitations.  Taking meds as directed w/o problems.  Denies medication side effects.    She has felt more bloated with her stomach. She is taking lactulose and fleets and says fluid is coming back up in her throat.  She took magnesium citrate about 3 days ago. She said she's only moved a small amount of stool and a lot of gas. She just in a lot of discomfort from it. No actual vomiting.  She is now seeing an allergist and they're doing some additional blood work. She had asked that those labs be sent to [place] as well just them aware. We have recently done some IgG testing but unfortunately the lab actually ran IgE levels. She called them to have those rerunning corrected. We have not received the report yet.   Past medical history, Surgical history, Family history not pertinant except as noted below, Social history, Allergies, and medications have been entered into the medical record, reviewed, and corrections made.   Review of Systems: No fevers, chills, night sweats, weight loss, chest pain, or shortness of breath.   Objective:    General: Well Developed, well nourished, and in no acute distress.  Neuro: Alert and oriented x3, extra-ocular muscles intact, sensation grossly intact.  HEENT: Normocephalic, atraumatic  Skin: Warm and dry, no rashes. Cardiac: Regular rate and rhythm, no murmurs rubs or gallops, no lower extremity edema.  Respiratory: Clear to auscultation bilaterally. Not using accessory muscles, speaking in full sentences. Abd: Normal bowel sounds. Generally tender. No organomegaly. Increased tympany.   Impression and Recommendations:     HTN - Well controlled. Continue current regimen. Follow up in  3 months.     Chronic constipation -work on increasing water intake.  Discussed abdominal massage, taking a walk today.  Continue with the [place].  Increase to TID.  Take the  magnesium citrate by late afternoon if not response to the lactulose     Food intolerance-we did call to get a copy of the IgG food report. I explained that these are not true allergies in fact her IgE levels were normal. These are foods that may just cause some GI distress such as bloating and gas and change in bowel movements. These are things that she may want to just keep to a minimum or could consider completely avoiding in her diet.  IgG levels were mildly elevated for mushroom, casein and, egg white. They were just slightly more elevated oat, rye, wheat, MALT, and barley.

## 2016-11-02 NOTE — Patient Instructions (Signed)
Increase the lactulose to three time a day Can repeat the mag citrate later today.

## 2016-11-03 ENCOUNTER — Ambulatory Visit: Payer: Self-pay | Admitting: Family Medicine

## 2016-11-08 DIAGNOSIS — H5712 Ocular pain, left eye: Secondary | ICD-10-CM | POA: Diagnosis not present

## 2016-11-08 DIAGNOSIS — Z888 Allergy status to other drugs, medicaments and biological substances status: Secondary | ICD-10-CM | POA: Diagnosis not present

## 2016-11-08 DIAGNOSIS — Z885 Allergy status to narcotic agent status: Secondary | ICD-10-CM | POA: Diagnosis not present

## 2016-11-08 DIAGNOSIS — Z9289 Personal history of other medical treatment: Secondary | ICD-10-CM | POA: Diagnosis not present

## 2016-11-08 DIAGNOSIS — G35 Multiple sclerosis: Secondary | ICD-10-CM | POA: Diagnosis not present

## 2016-11-08 DIAGNOSIS — Z8249 Family history of ischemic heart disease and other diseases of the circulatory system: Secondary | ICD-10-CM | POA: Diagnosis not present

## 2016-11-08 DIAGNOSIS — E876 Hypokalemia: Secondary | ICD-10-CM | POA: Diagnosis not present

## 2016-11-08 DIAGNOSIS — S0502XA Injury of conjunctiva and corneal abrasion without foreign body, left eye, initial encounter: Secondary | ICD-10-CM | POA: Diagnosis not present

## 2016-11-08 DIAGNOSIS — E785 Hyperlipidemia, unspecified: Secondary | ICD-10-CM | POA: Diagnosis not present

## 2016-11-08 DIAGNOSIS — Z886 Allergy status to analgesic agent status: Secondary | ICD-10-CM | POA: Diagnosis not present

## 2016-11-08 DIAGNOSIS — D649 Anemia, unspecified: Secondary | ICD-10-CM | POA: Diagnosis not present

## 2016-11-08 DIAGNOSIS — Z880 Allergy status to penicillin: Secondary | ICD-10-CM | POA: Diagnosis not present

## 2016-11-08 DIAGNOSIS — Z87891 Personal history of nicotine dependence: Secondary | ICD-10-CM | POA: Diagnosis not present

## 2016-11-08 DIAGNOSIS — M797 Fibromyalgia: Secondary | ICD-10-CM | POA: Diagnosis not present

## 2016-11-08 DIAGNOSIS — J45909 Unspecified asthma, uncomplicated: Secondary | ICD-10-CM | POA: Diagnosis not present

## 2016-11-08 DIAGNOSIS — I10 Essential (primary) hypertension: Secondary | ICD-10-CM | POA: Diagnosis not present

## 2016-11-08 DIAGNOSIS — R220 Localized swelling, mass and lump, head: Secondary | ICD-10-CM | POA: Diagnosis not present

## 2016-11-08 DIAGNOSIS — Z882 Allergy status to sulfonamides status: Secondary | ICD-10-CM | POA: Diagnosis not present

## 2016-11-08 LAB — BMP8+ANION GAP: Anion gap: 14

## 2016-11-08 LAB — BASIC METABOLIC PANEL
BUN: 14 mg/dL (ref 4–21)
Creatinine: 0.6 mg/dL (ref 0.5–1.1)
Glucose: 88 mg/dL
Potassium: 4.1 mmol/L (ref 3.4–5.3)
Sodium: 144 mmol/L (ref 137–147)

## 2016-11-08 LAB — ESTIMATED GFR
GFR, Est African American: >60
GFR, Est Non African American: >60

## 2016-11-08 LAB — CO2, TOTAL: Carbon Dioxide, Total: 23

## 2016-11-08 LAB — CALCIUM: Calcium: 8.8 mg/dL

## 2016-11-09 ENCOUNTER — Ambulatory Visit (INDEPENDENT_AMBULATORY_CARE_PROVIDER_SITE_OTHER): Payer: Medicare Other | Admitting: Family Medicine

## 2016-11-09 ENCOUNTER — Encounter: Payer: Self-pay | Admitting: Family Medicine

## 2016-11-09 ENCOUNTER — Telehealth: Payer: Self-pay | Admitting: Family Medicine

## 2016-11-09 DIAGNOSIS — S0502XA Injury of conjunctiva and corneal abrasion without foreign body, left eye, initial encounter: Secondary | ICD-10-CM | POA: Diagnosis not present

## 2016-11-09 DIAGNOSIS — Z23 Encounter for immunization: Secondary | ICD-10-CM | POA: Diagnosis not present

## 2016-11-09 LAB — CHLORIDE: Chloride: 107 mmol/L

## 2016-11-09 NOTE — Progress Notes (Signed)
   Subjective:    Patient ID: Jennifer [name], female    DOB: [birth year], 51 y.o.   MRN: [id number]  HPI Patient scratched cornea of left eye yesterday with hairbrush. ED suggested Tdap; reviewed by Dr.Metheney and it will be given today.    Review of Systems     Objective:   Physical Exam        Assessment & Plan:  Patient tolerated injection well without complications.

## 2016-11-09 NOTE — Progress Notes (Signed)
Agree with above 

## 2016-11-09 NOTE — Telephone Encounter (Signed)
Since has been 5 years would recommend booster.

## 2016-11-09 NOTE — Telephone Encounter (Signed)
Pt called clinic to see if she needs to get an updated tetanus shot. Pt reports she scratched her eye with a dirty plastic hairbrush. She went to [facility] ED for eval, they wanted to give her a tetanus shot. Pt last had one in 2013. Pt would like to know if she should get a booster or if she is OK with her current immunization. Routing for review.

## 2016-11-09 NOTE — Telephone Encounter (Signed)
Pt advised. She is going to go back to the ED and see if they will administer and fax a report. Offered Pt a NV today, she would prefer to try [place] location first. If they can not do it, she will contact me for a NV appt.

## 2016-11-14 IMAGING — CR DG CHEST 2V
2 series · 2 of 2 positions shown · non-contrast
Comparison: Chest radiograph performed 07/29/2015

CLINICAL DATA: Acute onset of cough and sore throat. Vomiting.
Initial encounter.

EXAM:
CHEST  2 VIEW

[w chest pa]
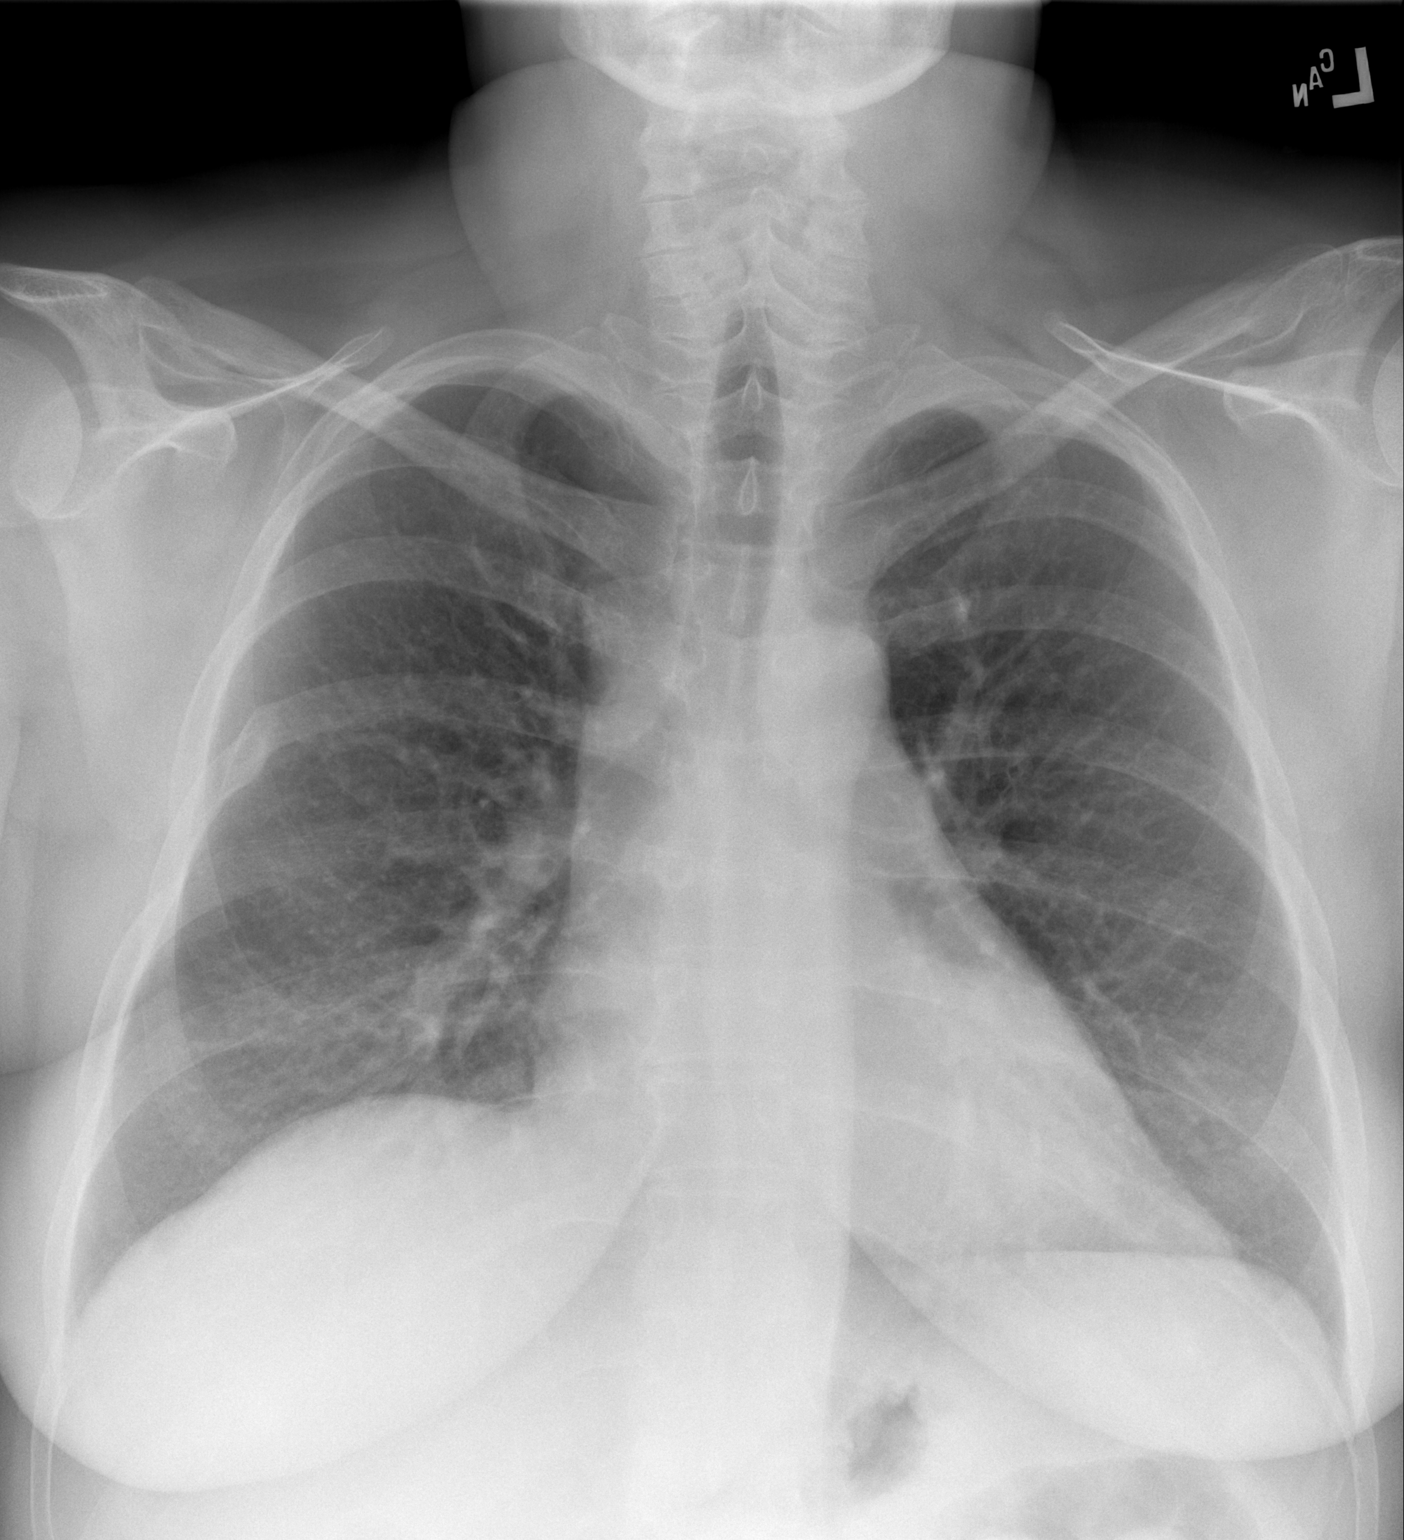

[w chest lat]
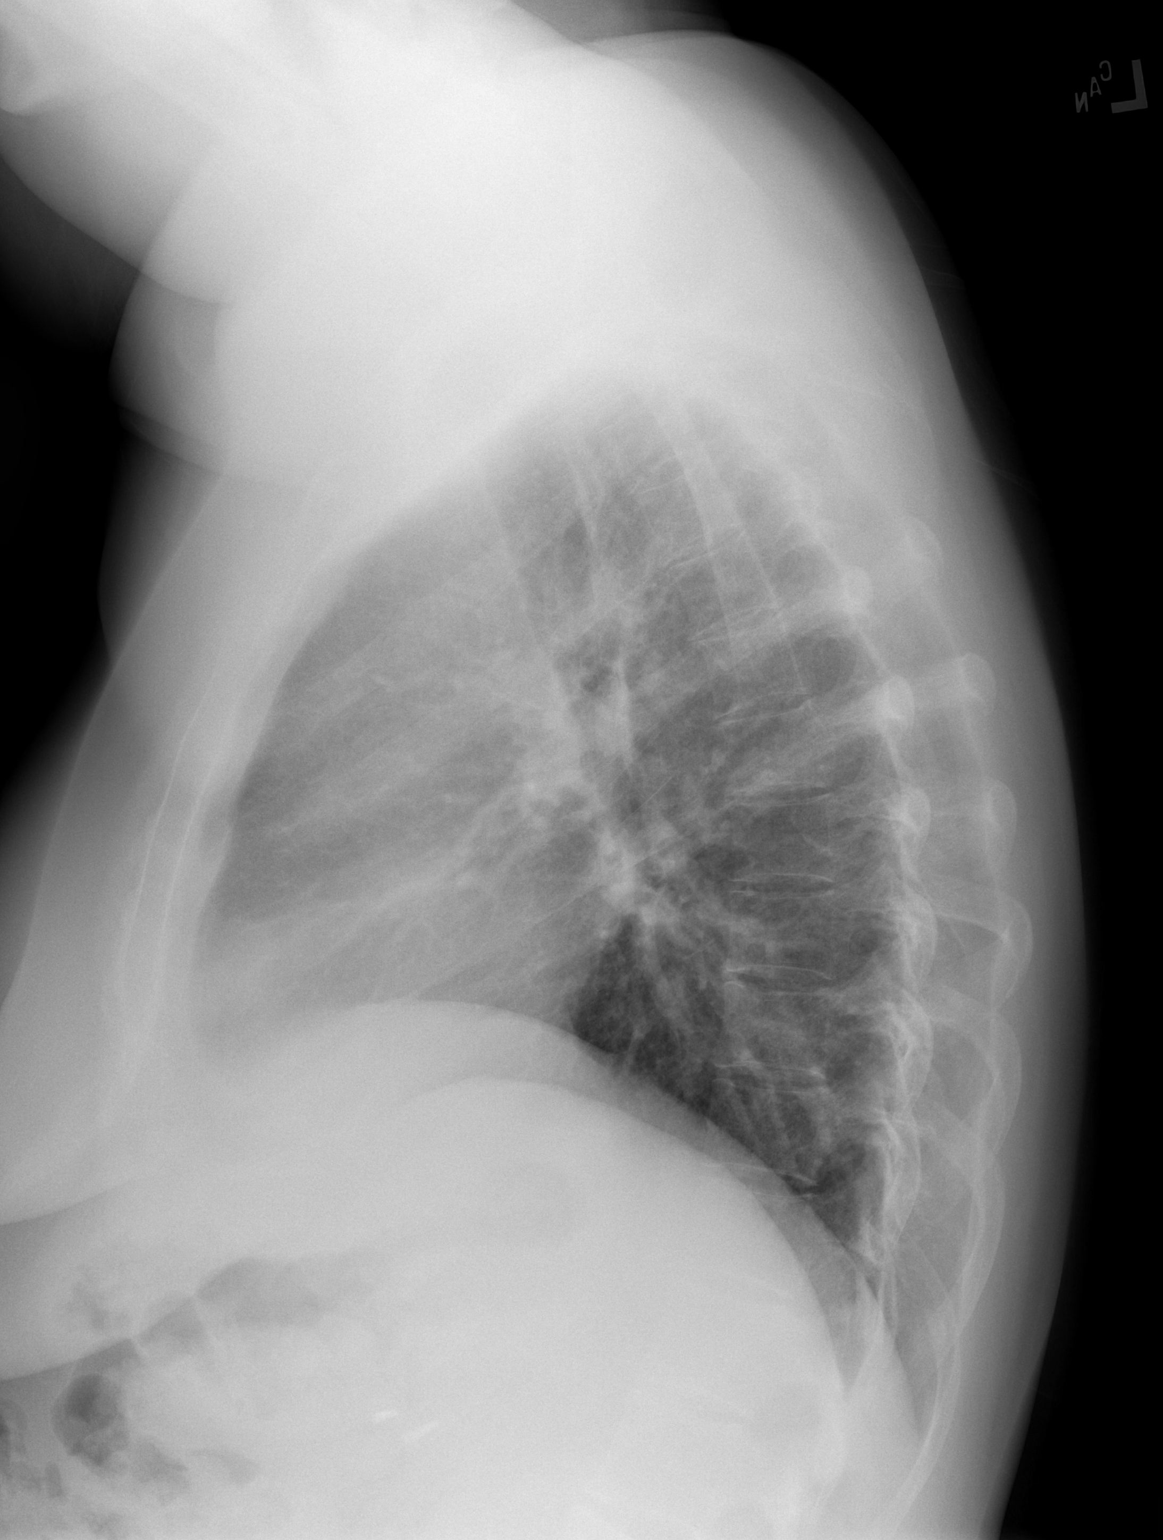

[2 of 2 positions shown; findings below may reference images not displayed]

FINDINGS: The lungs are well-aerated and clear. There is no evidence of focal
opacification, pleural effusion or pneumothorax.

The heart is normal in size; the mediastinal contour is within
normal limits. No acute osseous abnormalities are seen. There is a
chronic right sixth lateral rib deformity. Clips are noted within
the right upper quadrant, reflecting prior cholecystectomy.
IMPRESSION: No acute cardiopulmonary process seen.

## 2016-11-15 DIAGNOSIS — M47812 Spondylosis without myelopathy or radiculopathy, cervical region: Secondary | ICD-10-CM | POA: Diagnosis not present

## 2016-11-15 DIAGNOSIS — R51 Headache: Secondary | ICD-10-CM | POA: Diagnosis not present

## 2016-11-15 DIAGNOSIS — Z880 Allergy status to penicillin: Secondary | ICD-10-CM | POA: Diagnosis not present

## 2016-11-15 DIAGNOSIS — I10 Essential (primary) hypertension: Secondary | ICD-10-CM | POA: Diagnosis not present

## 2016-11-15 DIAGNOSIS — R531 Weakness: Secondary | ICD-10-CM | POA: Diagnosis not present

## 2016-11-15 DIAGNOSIS — J45909 Unspecified asthma, uncomplicated: Secondary | ICD-10-CM | POA: Diagnosis not present

## 2016-11-15 DIAGNOSIS — M797 Fibromyalgia: Secondary | ICD-10-CM | POA: Diagnosis not present

## 2016-11-15 DIAGNOSIS — Z79899 Other long term (current) drug therapy: Secondary | ICD-10-CM | POA: Diagnosis not present

## 2016-11-15 DIAGNOSIS — E785 Hyperlipidemia, unspecified: Secondary | ICD-10-CM | POA: Diagnosis not present

## 2016-11-15 DIAGNOSIS — R202 Paresthesia of skin: Secondary | ICD-10-CM | POA: Diagnosis not present

## 2016-11-15 DIAGNOSIS — G35 Multiple sclerosis: Secondary | ICD-10-CM | POA: Diagnosis not present

## 2016-11-16 ENCOUNTER — Encounter: Payer: Self-pay | Admitting: Family Medicine

## 2016-11-16 ENCOUNTER — Telehealth: Payer: Self-pay | Admitting: Family Medicine

## 2016-11-16 ENCOUNTER — Ambulatory Visit (INDEPENDENT_AMBULATORY_CARE_PROVIDER_SITE_OTHER): Payer: Medicare Other | Admitting: Family Medicine

## 2016-11-16 VITALS — BP 134/83 | HR 91 | Ht 62.0 in | Wt 209.7 lb

## 2016-11-16 DIAGNOSIS — R0789 Other chest pain: Secondary | ICD-10-CM | POA: Diagnosis not present

## 2016-11-16 DIAGNOSIS — I10 Essential (primary) hypertension: Secondary | ICD-10-CM | POA: Diagnosis not present

## 2016-11-16 DIAGNOSIS — R140 Abdominal distension (gaseous): Secondary | ICD-10-CM

## 2016-11-16 DIAGNOSIS — K5909 Other constipation: Secondary | ICD-10-CM

## 2016-11-16 DIAGNOSIS — R7309 Other abnormal glucose: Secondary | ICD-10-CM | POA: Diagnosis not present

## 2016-11-16 DIAGNOSIS — R002 Palpitations: Secondary | ICD-10-CM

## 2016-11-16 LAB — POCT URINALYSIS DIPSTICK
Bilirubin, UA: NEGATIVE
Blood, UA: NEGATIVE
Glucose, UA: NEGATIVE
Ketones, UA: NEGATIVE
Leukocytes, UA: NEGATIVE
Nitrite, UA: NEGATIVE
Protein, UA: NEGATIVE
Spec Grav, UA: 1.035 (ref 1.030–1.035)
Urobilinogen, UA: 0.2 (ref ?–2.0)
pH, UA: 5.5 (ref 5.0–8.0)

## 2016-11-16 LAB — POCT GLYCOSYLATED HEMOGLOBIN (HGB A1C): Hemoglobin A1C: 5.9

## 2016-11-16 NOTE — Telephone Encounter (Signed)
Order signed.

## 2016-11-16 NOTE — Telephone Encounter (Signed)
Pt called clinic today stating she would like a new referral to a new cardiologist. Pt reports she is "fed up" with the cardiologist in [place]. Pt states she will get the tilt table test done in [place] before she leaves the practice. Request to see Dr. Alroy Dust with [facility] in [place]. Will pend referral for PCP to sign off on.

## 2016-11-16 NOTE — Progress Notes (Addendum)
Subjective:    CC: HTN -   HPI:  HTN - says her BP was really high yesterday when went to the ED but she was in pain as well. She feels like the whole right side of her body is bothering her all the way from her foot up to her head. In fact they did a head CT which was negative. No sign of sinusitis. She has had some low back discomfort as well. She feels like she has been running a low grade temperature. As the complains of a pressure on the right side of her face into her right jaw. She did get a T tdap booster last week and wasn't sure if this could be triggering some of this.  F/U chronic constipation -  47 family had a little bit larger bowel movement a few days ago but has gone back to having the pencil thin stools. She still feels very bloated mostly in her upper abdomen versus her lower abdomen. Though she's had a little cramping in her lower abdomen. She has felt quite nauseated and felt like she was going to vomit several times.  Impaired fasting glucose-no increased thirst or urination. No symptoms consistent with hypoglycemia.  She's also been getting some low back spasms. Worse on the right radiating down into the right buttock area. No symptoms into the legs.  Past medical history, Surgical history, Family history not pertinant except as noted below, Social history, Allergies, and medications have been entered into the medical record, reviewed, and corrections made.   Review of Systems: No fevers, chills, night sweats, weight loss, chest pain, or shortness of breath.   Objective:    General: Well Developed, well nourished, and in no acute distress.  Neuro: Alert and oriented x3, extra-ocular muscles intact, sensation grossly intact.  HEENT: Normocephalic, atraumatic  Skin: Warm and dry, no rashes. Cardiac: Regular rate and rhythm, no murmurs rubs or gallops, no lower extremity edema.  Respiratory: Clear to auscultation bilaterally. Not using accessory muscles, speaking in full  sentences.   Impression and Recommendations:    HTN - Blood pressure at goal today. We'll again will continue to monitor.  Chronic constipation - Continue with lactulose since I do think that helps her with regularity and okay to use magnesium citrate as needed.  IFG - Stable. Recheck in 35months.    Lab Results  Component Value Date   HGBA1C 5.9 11/16/2016   Abdominal bloating-suspect related to her chronic constipation. I did go ahead and do a urinalysis today just to rule out UTI.  Right sided body pain-most suspect her fibromyalgia as the likely cause.

## 2016-11-17 ENCOUNTER — Telehealth: Payer: Self-pay | Admitting: Family Medicine

## 2016-11-17 ENCOUNTER — Ambulatory Visit: Payer: Self-pay | Admitting: Family Medicine

## 2016-11-17 DIAGNOSIS — D508 Other iron deficiency anemias: Secondary | ICD-10-CM | POA: Diagnosis not present

## 2016-11-17 NOTE — Telephone Encounter (Signed)
Left detailed message with information on the foods there is a sensitivity to and to call if there are any questions.

## 2016-11-17 NOTE — Telephone Encounter (Signed)
Call patient: To let her know that I did finally get the IgG testing on the food allergies. This is the one I had originally wanted instead of the IgE. On the IgG levels it looks like she is have a sensitivity to barley, MALT, wheat, rye, and oat   Beatrice Lecher, MD

## 2016-11-19 IMAGING — CR DG CHEST 2V
2 series · 2 of 2 positions shown · non-contrast
Comparison: August 22, 2015.

CLINICAL DATA: Cough, wheezing.

EXAM:
CHEST  2 VIEW

[w chest pa]
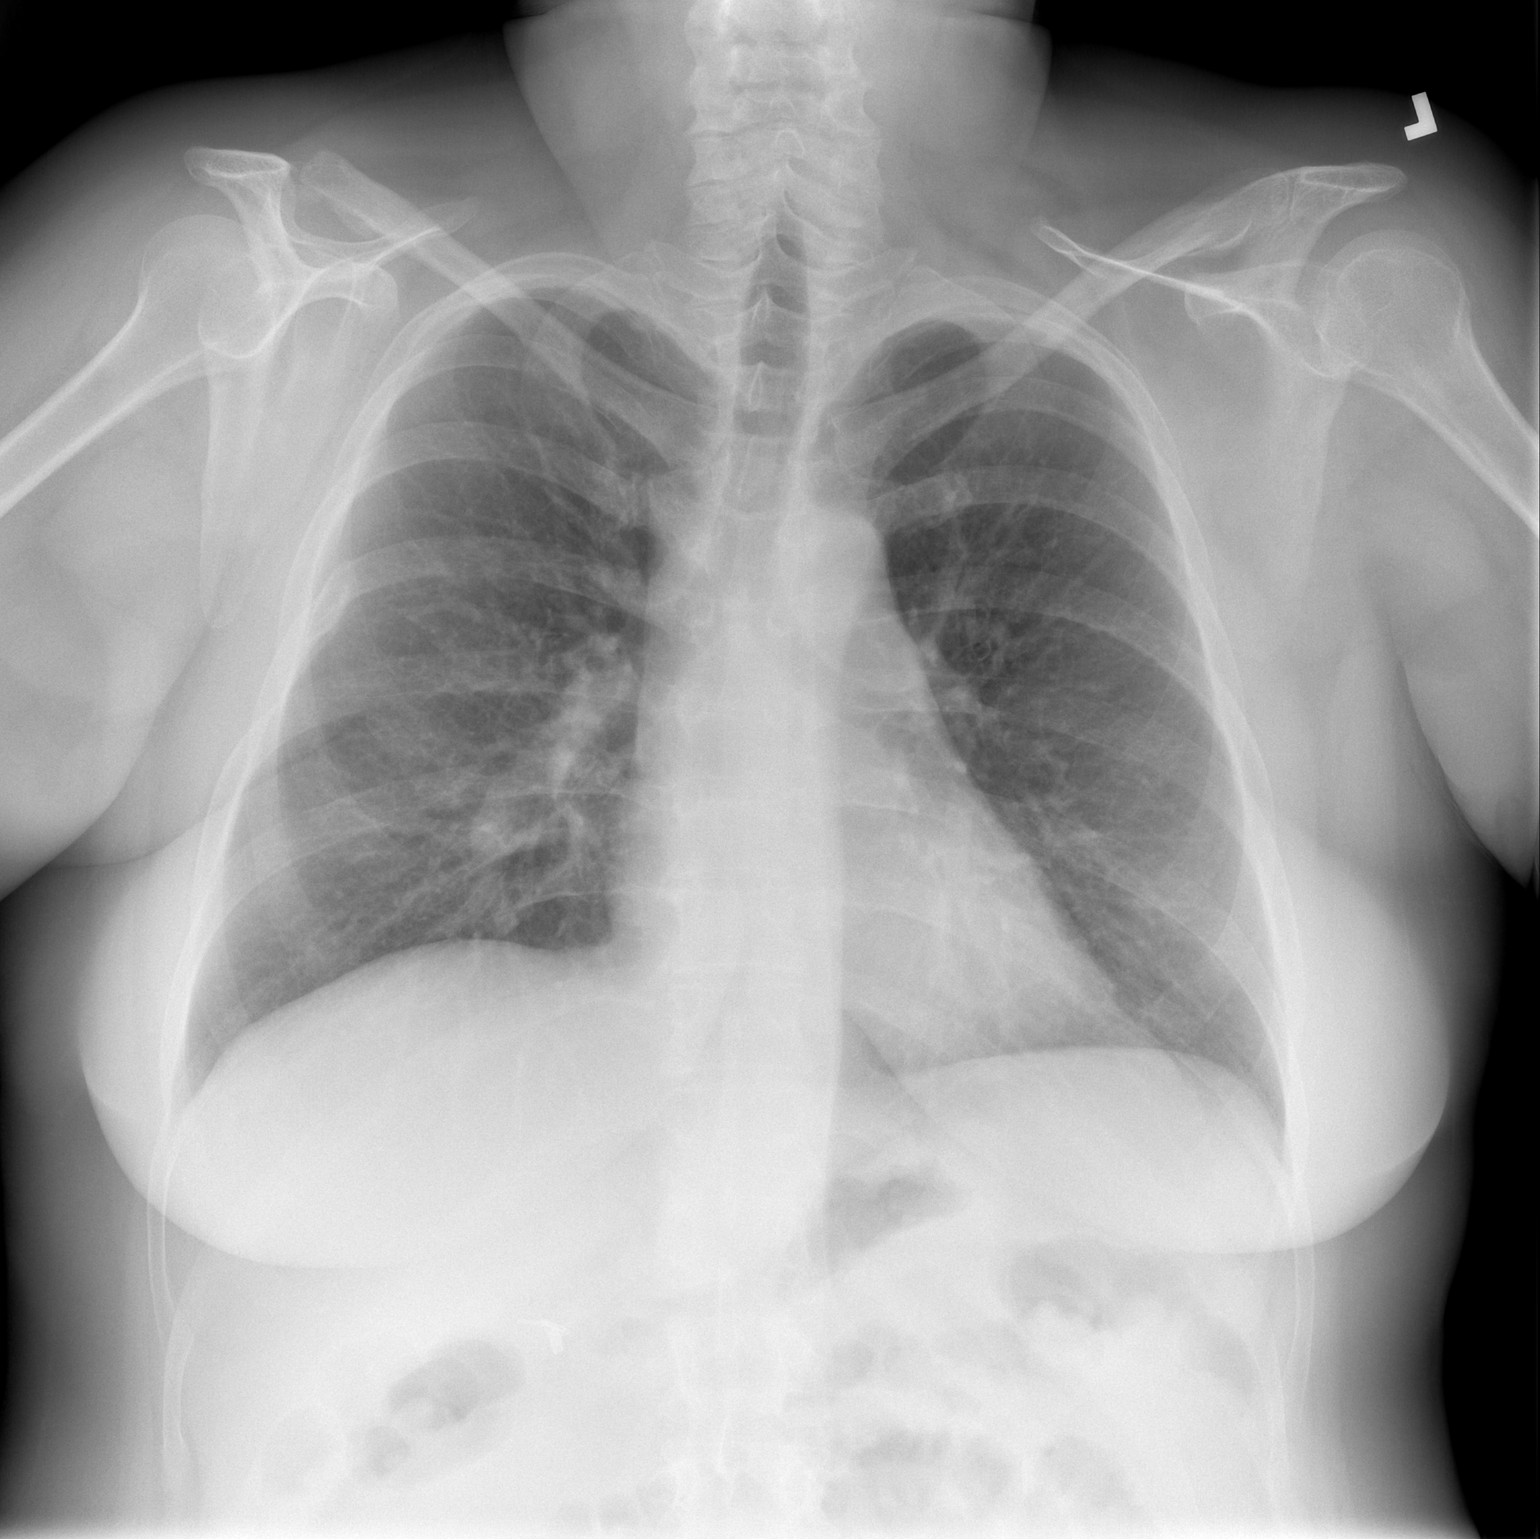

[w chest lat]
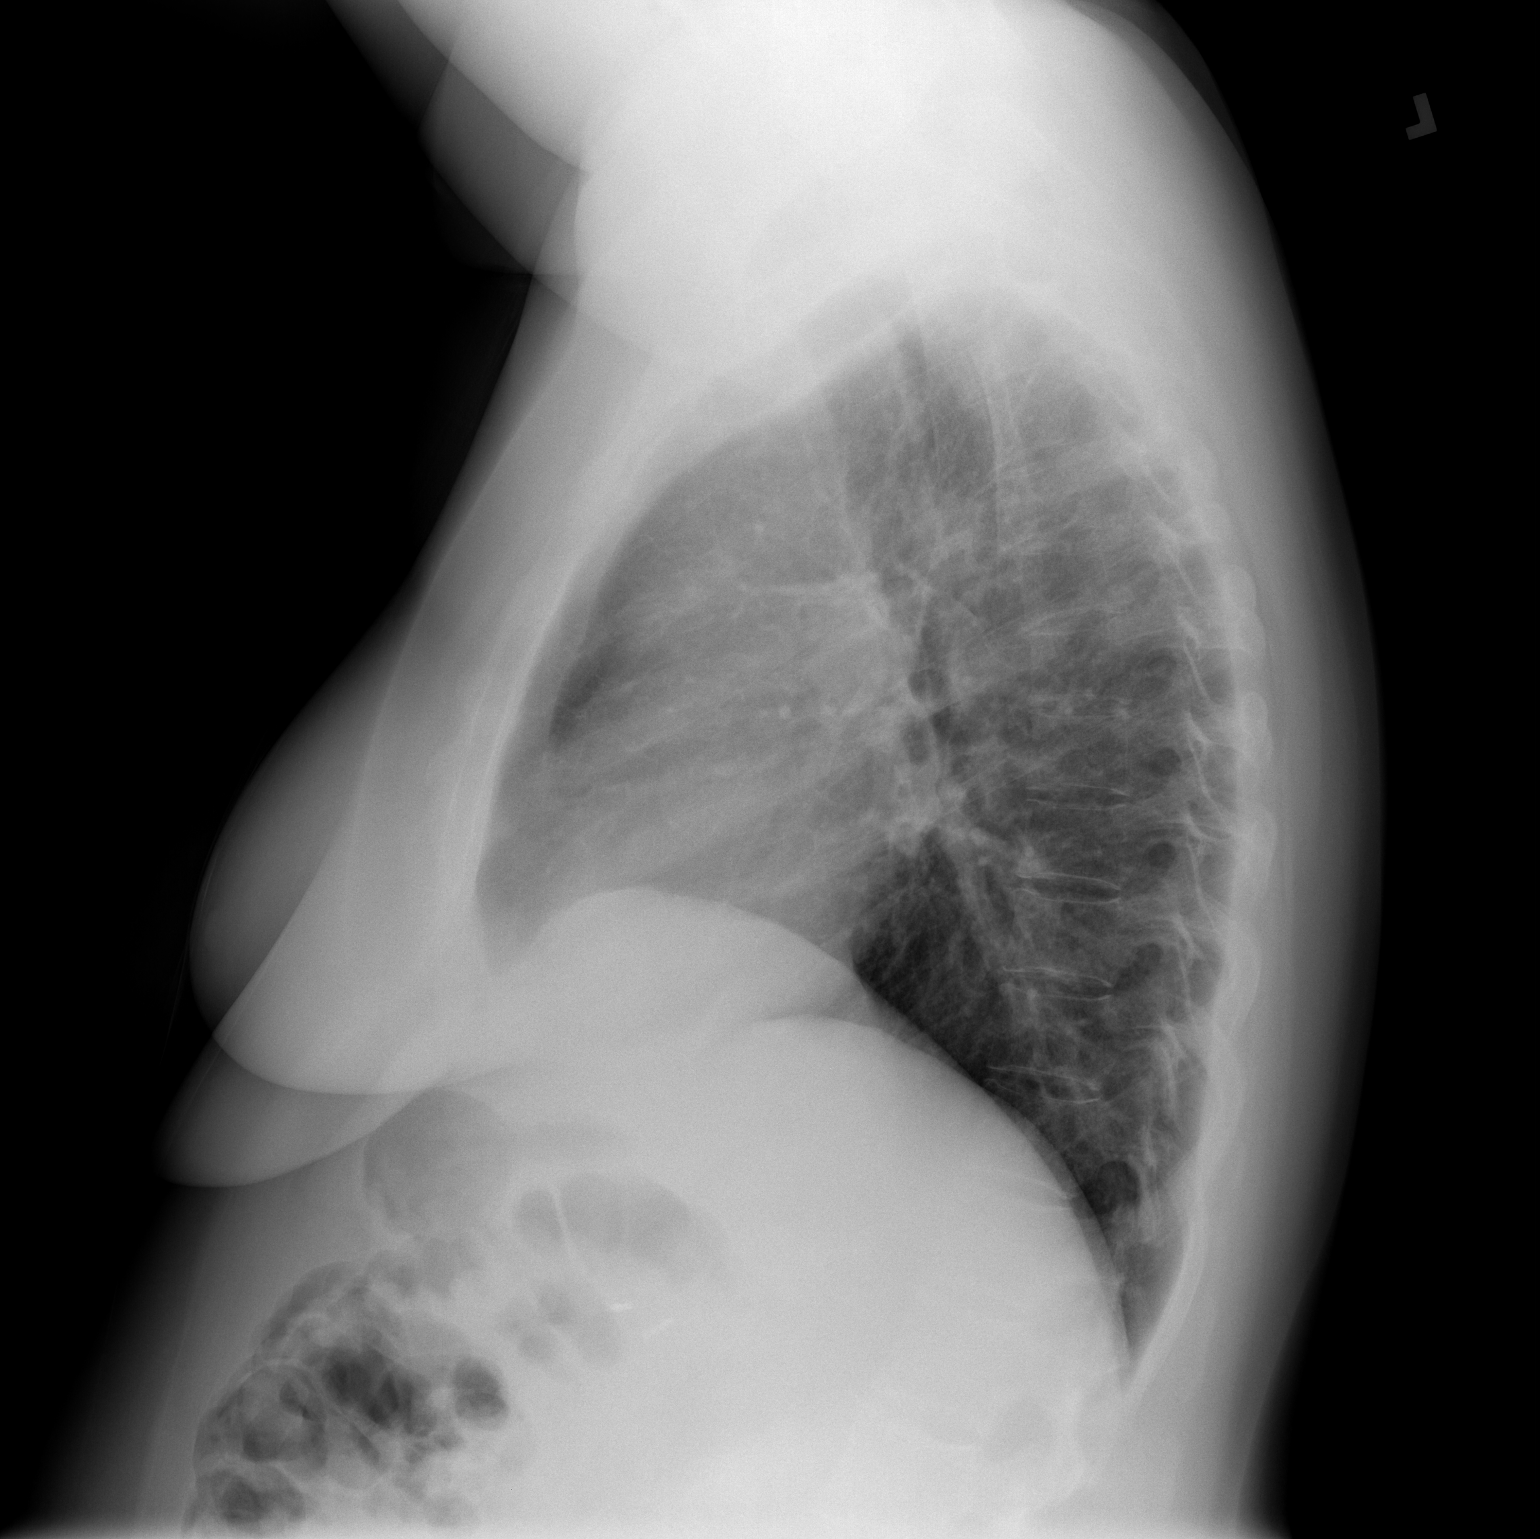

[2 of 2 positions shown; findings below may reference images not displayed]

FINDINGS: The heart size and mediastinal contours are within normal limits.
Both lungs are clear. No pneumothorax or pleural effusion is noted.
Old right rib fracture is noted.
IMPRESSION: No active cardiopulmonary disease.

## 2016-11-20 ENCOUNTER — Emergency Department (HOSPITAL_BASED_OUTPATIENT_CLINIC_OR_DEPARTMENT_OTHER)
Admission: EM | Admit: 2016-11-20 | Discharge: 2016-11-20 | Disposition: A | Discharge status: Home / Self Care | Payer: Medicare Other | Attending: Emergency Medicine | Admitting: Emergency Medicine

## 2016-11-20 ENCOUNTER — Telehealth: Payer: Self-pay

## 2016-11-20 ENCOUNTER — Encounter (HOSPITAL_BASED_OUTPATIENT_CLINIC_OR_DEPARTMENT_OTHER): Payer: Self-pay | Admitting: *Deleted

## 2016-11-20 DIAGNOSIS — M79601 Pain in right arm: Secondary | ICD-10-CM | POA: Insufficient documentation

## 2016-11-20 DIAGNOSIS — Z87891 Personal history of nicotine dependence: Secondary | ICD-10-CM | POA: Diagnosis not present

## 2016-11-20 DIAGNOSIS — Z79899 Other long term (current) drug therapy: Secondary | ICD-10-CM | POA: Diagnosis not present

## 2016-11-20 DIAGNOSIS — I10 Essential (primary) hypertension: Secondary | ICD-10-CM | POA: Diagnosis not present

## 2016-11-20 DIAGNOSIS — J45909 Unspecified asthma, uncomplicated: Secondary | ICD-10-CM | POA: Diagnosis not present

## 2016-11-20 DIAGNOSIS — M539 Dorsopathy, unspecified: Secondary | ICD-10-CM
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
## 2016-11-20 NOTE — Telephone Encounter (Signed)
Pt left VM stating the pain in her right arm is coming back like it was before and would like to know what to do. Would like something else besides PT and injections at this time. Please advise.

## 2016-11-20 NOTE — ED Triage Notes (Signed)
Pain and swelling in her right arm x 2 weeks. She is moving her arm with no difficulty. Radial pulse palpated. Warm to touch.

## 2016-11-21 DIAGNOSIS — R7303 Prediabetes: Secondary | ICD-10-CM | POA: Diagnosis not present

## 2016-11-21 DIAGNOSIS — E785 Hyperlipidemia, unspecified: Secondary | ICD-10-CM | POA: Diagnosis not present

## 2016-11-21 DIAGNOSIS — K5900 Constipation, unspecified: Secondary | ICD-10-CM | POA: Diagnosis not present

## 2016-11-21 DIAGNOSIS — E269 Hyperaldosteronism, unspecified: Secondary | ICD-10-CM | POA: Diagnosis not present

## 2016-11-21 DIAGNOSIS — D509 Iron deficiency anemia, unspecified: Secondary | ICD-10-CM | POA: Diagnosis not present

## 2016-11-21 DIAGNOSIS — E2609 Other primary hyperaldosteronism: Secondary | ICD-10-CM | POA: Diagnosis not present

## 2016-11-21 DIAGNOSIS — G8929 Other chronic pain: Secondary | ICD-10-CM | POA: Diagnosis not present

## 2016-11-21 DIAGNOSIS — Z883 Allergy status to other anti-infective agents status: Secondary | ICD-10-CM | POA: Diagnosis not present

## 2016-11-21 DIAGNOSIS — R569 Unspecified convulsions: Secondary | ICD-10-CM | POA: Diagnosis not present

## 2016-11-21 DIAGNOSIS — R0602 Shortness of breath: Secondary | ICD-10-CM | POA: Diagnosis not present

## 2016-11-21 DIAGNOSIS — G35 Multiple sclerosis: Secondary | ICD-10-CM | POA: Diagnosis not present

## 2016-11-21 DIAGNOSIS — Z9861 Coronary angioplasty status: Secondary | ICD-10-CM | POA: Diagnosis not present

## 2016-11-21 DIAGNOSIS — Z9049 Acquired absence of other specified parts of digestive tract: Secondary | ICD-10-CM | POA: Diagnosis not present

## 2016-11-21 DIAGNOSIS — R1310 Dysphagia, unspecified: Secondary | ICD-10-CM | POA: Diagnosis not present

## 2016-11-21 DIAGNOSIS — J45909 Unspecified asthma, uncomplicated: Secondary | ICD-10-CM | POA: Diagnosis not present

## 2016-11-21 DIAGNOSIS — Z9889 Other specified postprocedural states: Secondary | ICD-10-CM | POA: Diagnosis not present

## 2016-11-21 DIAGNOSIS — M549 Dorsalgia, unspecified: Secondary | ICD-10-CM | POA: Diagnosis not present

## 2016-11-21 DIAGNOSIS — I10 Essential (primary) hypertension: Secondary | ICD-10-CM | POA: Diagnosis not present

## 2016-11-21 DIAGNOSIS — G219 Secondary parkinsonism, unspecified: Secondary | ICD-10-CM | POA: Diagnosis not present

## 2016-11-21 DIAGNOSIS — I471 Supraventricular tachycardia: Secondary | ICD-10-CM | POA: Diagnosis not present

## 2016-11-21 DIAGNOSIS — K5909 Other constipation: Secondary | ICD-10-CM | POA: Diagnosis not present

## 2016-11-21 NOTE — Telephone Encounter (Signed)
Information given and pt is hesitant about the gabapentin and doesn't feel treatment will help with her history of bulging discs. Pt states she really feels like something else is wrong since her last MRI. Would like to be seen by neck and spine doctor with [facility].

## 2016-11-21 NOTE — Telephone Encounter (Signed)
Other than surgery, injections and physical therapy is all we have, she could consider going up on her gabapentin, doubling the current dose.

## 2016-11-21 NOTE — Telephone Encounter (Signed)
Have her pick a doctor and I'll place the referral.

## 2016-11-22 DIAGNOSIS — R05 Cough: Secondary | ICD-10-CM | POA: Diagnosis not present

## 2016-11-22 DIAGNOSIS — I471 Supraventricular tachycardia: Secondary | ICD-10-CM | POA: Diagnosis not present

## 2016-11-22 DIAGNOSIS — K220 Achalasia of cardia: Secondary | ICD-10-CM | POA: Diagnosis not present

## 2016-11-22 DIAGNOSIS — K2270 Barrett's esophagus without dysplasia: Secondary | ICD-10-CM | POA: Diagnosis not present

## 2016-11-22 DIAGNOSIS — K581 Irritable bowel syndrome with constipation: Secondary | ICD-10-CM | POA: Diagnosis not present

## 2016-11-23 ENCOUNTER — Telehealth: Payer: Self-pay | Admitting: Family Medicine

## 2016-11-23 NOTE — Telephone Encounter (Signed)
Referral placed, she can discuss MRI with the new providers.

## 2016-11-23 NOTE — Telephone Encounter (Signed)
error 

## 2016-11-23 NOTE — Telephone Encounter (Signed)
Pt would like to be seen at the [facility] in [facility]. Pt still thinks she needs another MRI of her back because she has a pain radiating up her spine every since her spinal tap. States she did the tilt table and felt instant relief when the pressure was relieved of spine. Please assist

## 2016-11-24 ENCOUNTER — Telehealth: Payer: Self-pay | Admitting: Family Medicine

## 2016-11-24 IMAGING — CR DG CHEST 2V
2 series · 2 of 2 positions shown · non-contrast
Comparison: Radiographs 08/27/2015 and 08/22/2015.

CLINICAL DATA: Cough and chest congestion with left and lower right
chest pain for 10 days. History of hypertension and asthma.

EXAM:
CHEST  2 VIEW

[w chest pa]
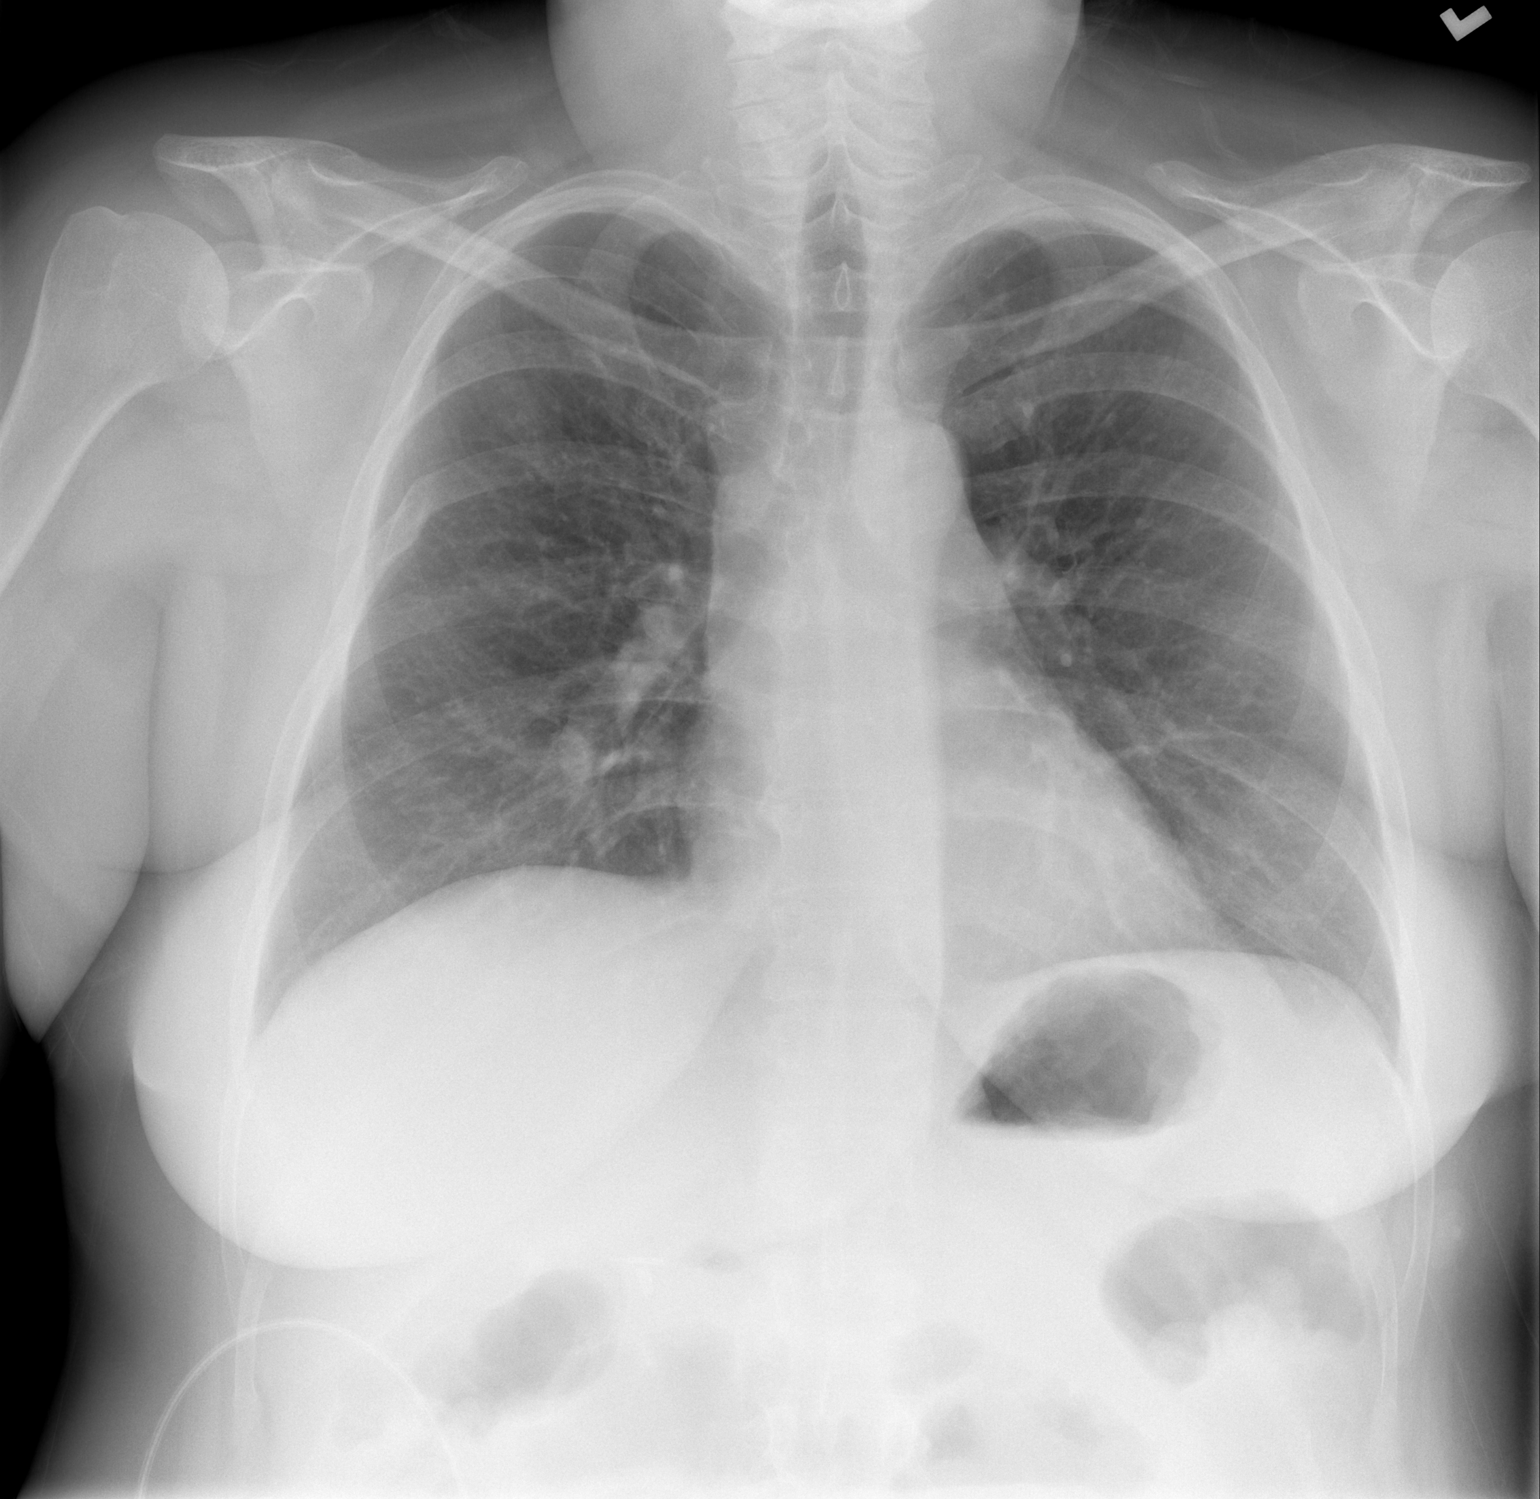

[w chest lat]
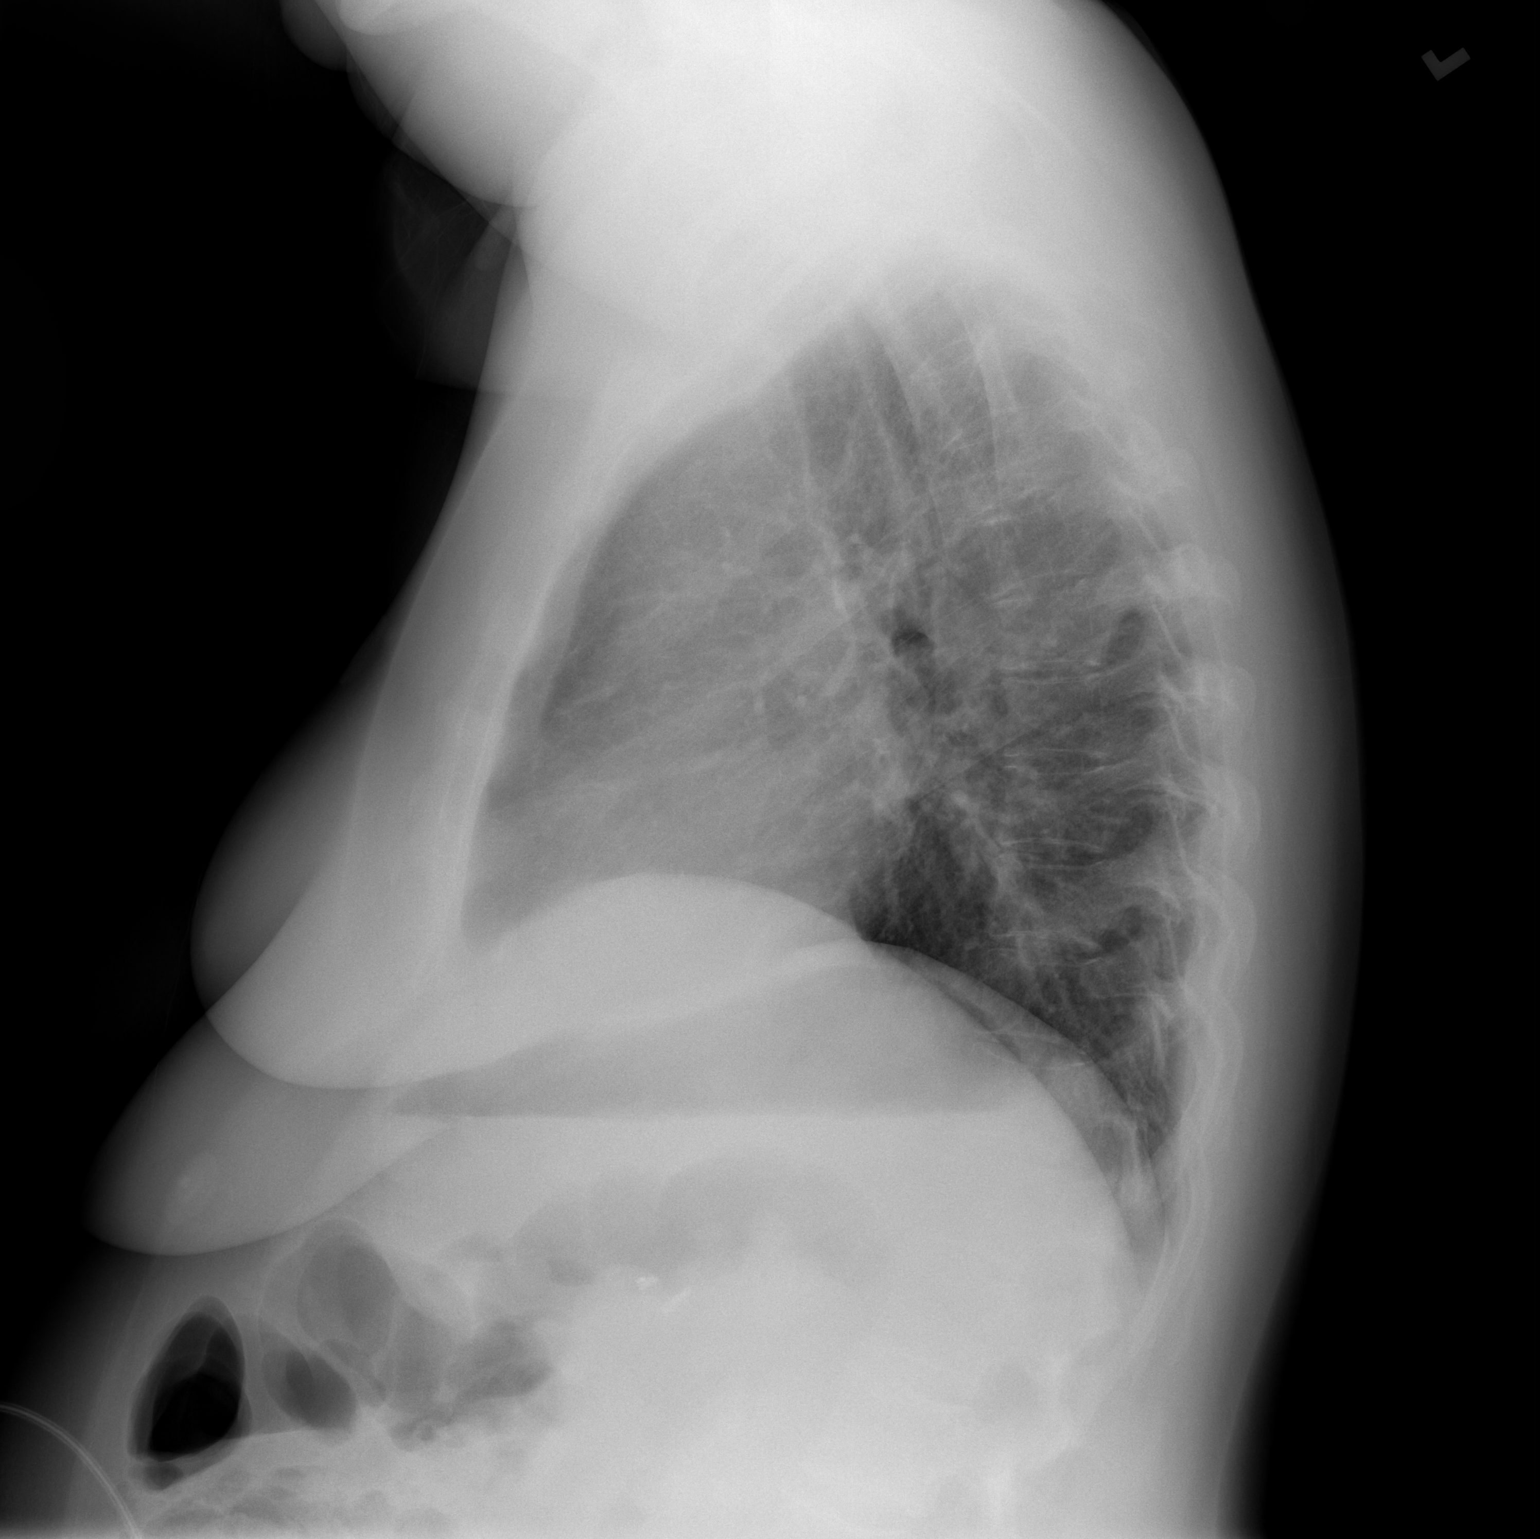

[2 of 2 positions shown; findings below may reference images not displayed]

FINDINGS: The heart size and mediastinal contours are stable. The lungs are
clear. There is no pleural effusion or pneumothorax. Old rib
fracture noted on the right. No acute osseous findings are seen.
IMPRESSION: Stable chest.  No acute cardiopulmonary process.

## 2016-11-24 NOTE — Telephone Encounter (Signed)
Pt.notified

## 2016-11-24 NOTE — Telephone Encounter (Signed)
Left VM for Pt to return clinic call regarding results, callback information provided. 

## 2016-11-24 NOTE — Telephone Encounter (Signed)
Call patient: I did receive the note from her recent tilt table test. It was normal.

## 2016-11-28 ENCOUNTER — Telehealth: Payer: Self-pay

## 2016-11-28 DIAGNOSIS — R110 Nausea: Secondary | ICD-10-CM | POA: Diagnosis not present

## 2016-11-28 DIAGNOSIS — J45909 Unspecified asthma, uncomplicated: Secondary | ICD-10-CM | POA: Diagnosis not present

## 2016-11-28 DIAGNOSIS — D649 Anemia, unspecified: Secondary | ICD-10-CM | POA: Diagnosis not present

## 2016-11-28 DIAGNOSIS — I10 Essential (primary) hypertension: Secondary | ICD-10-CM | POA: Diagnosis not present

## 2016-11-28 DIAGNOSIS — Z880 Allergy status to penicillin: Secondary | ICD-10-CM | POA: Diagnosis not present

## 2016-11-28 DIAGNOSIS — M797 Fibromyalgia: Secondary | ICD-10-CM | POA: Diagnosis not present

## 2016-11-28 DIAGNOSIS — Z9289 Personal history of other medical treatment: Secondary | ICD-10-CM | POA: Diagnosis not present

## 2016-11-28 DIAGNOSIS — G4730 Sleep apnea, unspecified: Secondary | ICD-10-CM | POA: Diagnosis not present

## 2016-11-28 DIAGNOSIS — Z888 Allergy status to other drugs, medicaments and biological substances status: Secondary | ICD-10-CM | POA: Diagnosis not present

## 2016-11-28 DIAGNOSIS — Z886 Allergy status to analgesic agent status: Secondary | ICD-10-CM | POA: Diagnosis not present

## 2016-11-28 DIAGNOSIS — G35 Multiple sclerosis: Secondary | ICD-10-CM | POA: Diagnosis not present

## 2016-11-28 DIAGNOSIS — R1031 Right lower quadrant pain: Secondary | ICD-10-CM | POA: Diagnosis not present

## 2016-11-28 DIAGNOSIS — R109 Unspecified abdominal pain: Secondary | ICD-10-CM | POA: Diagnosis not present

## 2016-11-28 DIAGNOSIS — Z87891 Personal history of nicotine dependence: Secondary | ICD-10-CM | POA: Diagnosis not present

## 2016-11-28 DIAGNOSIS — Z885 Allergy status to narcotic agent status: Secondary | ICD-10-CM | POA: Diagnosis not present

## 2016-11-28 DIAGNOSIS — Z882 Allergy status to sulfonamides status: Secondary | ICD-10-CM | POA: Diagnosis not present

## 2016-11-28 DIAGNOSIS — Z8249 Family history of ischemic heart disease and other diseases of the circulatory system: Secondary | ICD-10-CM | POA: Diagnosis not present

## 2016-11-28 DIAGNOSIS — E785 Hyperlipidemia, unspecified: Secondary | ICD-10-CM | POA: Diagnosis not present

## 2016-11-28 NOTE — Telephone Encounter (Signed)
Pt states she tried to make an appointment with the specialists but they are requesting that she bring in extra money for x-rays. Pt states she didn't realize this was an independent office and would like to know if you are willing to order her another MRI on her spine because she feels she's having different symptoms since her last MRI. Pt does not feel x-rays will show anything and doesn't want to have to pay them for extra images. Please advise.

## 2016-11-28 NOTE — Telephone Encounter (Signed)
She has established spine care with another provider, further management can be per them. If she needs x-rays then she can just come here, have her most recent lumbar and cervical spine x-rays and MRI burned to a disc, and have them decide on exam and history whether another MRI is needed. I can't make this determination over the phone.

## 2016-11-29 NOTE — Telephone Encounter (Signed)
Pt has not been scheduled an appointment to be seen by specialist yet.

## 2016-11-29 NOTE — Telephone Encounter (Signed)
Have her come in and discuss exactly why she needs another MRI.  She just had one in February.  Short of major trauma things don't change that quickly in the cervical spine to where a new MRI would change the management of the situation.

## 2016-11-30 ENCOUNTER — Ambulatory Visit (INDEPENDENT_AMBULATORY_CARE_PROVIDER_SITE_OTHER): Payer: Medicare Other | Admitting: Family Medicine

## 2016-11-30 ENCOUNTER — Encounter: Payer: Self-pay | Admitting: Family Medicine

## 2016-11-30 VITALS — BP 153/92 | HR 91 | Wt 211.0 lb

## 2016-11-30 DIAGNOSIS — R1084 Generalized abdominal pain: Secondary | ICD-10-CM | POA: Diagnosis not present

## 2016-11-30 DIAGNOSIS — L918 Other hypertrophic disorders of the skin: Secondary | ICD-10-CM

## 2016-11-30 DIAGNOSIS — R42 Dizziness and giddiness: Secondary | ICD-10-CM | POA: Diagnosis not present

## 2016-11-30 DIAGNOSIS — I10 Essential (primary) hypertension: Secondary | ICD-10-CM | POA: Diagnosis not present

## 2016-11-30 DIAGNOSIS — W450XXA Nail entering through skin, initial encounter: Secondary | ICD-10-CM | POA: Diagnosis not present

## 2016-11-30 DIAGNOSIS — K5909 Other constipation: Secondary | ICD-10-CM

## 2016-11-30 DIAGNOSIS — H9202 Otalgia, left ear: Secondary | ICD-10-CM | POA: Diagnosis not present

## 2016-11-30 NOTE — Progress Notes (Signed)
Subjective:    CC:   HPI:  Left ear Pain - She complains of left ear pain for a few days. She says it feels like a deep ache. She feels that there may be a little wax in the ear. No drainage. No upper respiratory infection. No fevers chills or sweats.  Abdominal pain-she is complaining of feeling like her belly is feeling distended and hard again. She had palms with intermittent constipation on and off. She usually uses lactulose to help her go. Sometimes she'll take it for a while and then stop. She's been having a lot more discomfort in the left lower quadrant close to the suprapubic area.  She has a fingernail on her left fifth finger that she thinks is about to fall off. It was cut and damaged about 2 months ago. She just wants me to look at it today.  Cardiac-she recently had her tilt table test which was essentially normal. Her cardiologist felt like it was likely still related to vertigo or vestibular issue. And recommended that she follow-up with neurology. Of note, she was discharged from Dr. Lance Bosch practice. She felt like this was done a little inferiorly as she's been trying to apply for patient assistance for well over a year  Hypertension- Pt denies chest pain, SOB, dizziness, or heart palpitations.  Taking meds as directed w/o problems.  Denies medication side effects.    He has several skin tags under her right axilla she would like me to look at today. As skin lesion on her back.  Past medical history, Surgical history, Family history not pertinant except as noted below, Social history, Allergies, and medications have been entered into the medical record, reviewed, and corrections made.   Review of Systems: No fevers, chills, night sweats, weight loss, chest pain, or shortness of breath.   Objective:    General: Well Developed, well nourished, and in no acute distress.  Neuro: Alert and oriented x3, extra-ocular muscles intact, sensation grossly intact.  HEENT:  Normocephalic, atraumatic, Oropharynx clear, TMs and canals are clear bilaterally. No significant cervical lymphadenopathy.  Skin: Warm and dry, no rashes.She has several skin tags under her right axilla and a small approximately 3 mm pink papular skin mole on her left upper back. Cardiac: Regular rate and rhythm, no murmurs rubs or gallops, no lower extremity edema.  Respiratory: Clear to auscultation bilaterally. Not using accessory muscles, speaking in full sentences. Abd: Increased tympany with some bloating. No masses and no organomegaly. Nontender on exam   Impression and Recommendations:    Left otalgia-recommend that she restart her Flonase or Nasonex. Suspect eustachian tube dysfunction since the ear exam itself is normal.  Abdominal pain-no red flag symptoms today. Explained that when she notices her belly is more distended and hard it is probably because there is too much stool in the colon. Explained that we need to be aggressive in keeping the bowels moving. She did restart her lactulose yesterday so encouraged her to continue with this since it does usually work well for her.  Nail injury-gave reassurance. She wants to know if it should be taken off and I recommended that she just let it fall off on its own. If she's worried about catching on her clothing etc. she can certainly wear a Band-Aid over it.  Dizziness/vertigo-she does do the Epley maneuvers at home when she starts again symptoms.  Hypertension-blood pressure is a little elevated today. It was better on recheck. Consider adjusting medication but she is extremely sensitive  to medications and rather give it a week or 2 and recheck it before we make an adjustment.  Skin tags-I'll be happy to see her back to have these removed.  Time spent 45 minutes, greater than 50% time spent counseling about left otalgia, dizziness, abdominal pain, nail injury, hypertension, skin tags.

## 2016-11-30 NOTE — Patient Instructions (Addendum)
Restart your flonase or nasonex. Schedule for skin tag removal

## 2016-12-03 IMAGING — CR DG ABDOMEN ACUTE W/ 1V CHEST
4 series · 4 of 4 positions shown · non-contrast
Comparison: Chest in two views abdomen 05/06/2015. PA and lateral
chest 09/01/2015.

CLINICAL DATA: Cough and congestion for approximately 2 weeks. Now
with right upper quadrant pain. Initial encounter.

EXAM:
DG ABDOMEN ACUTE W/ 1V CHEST

[w chest pa]
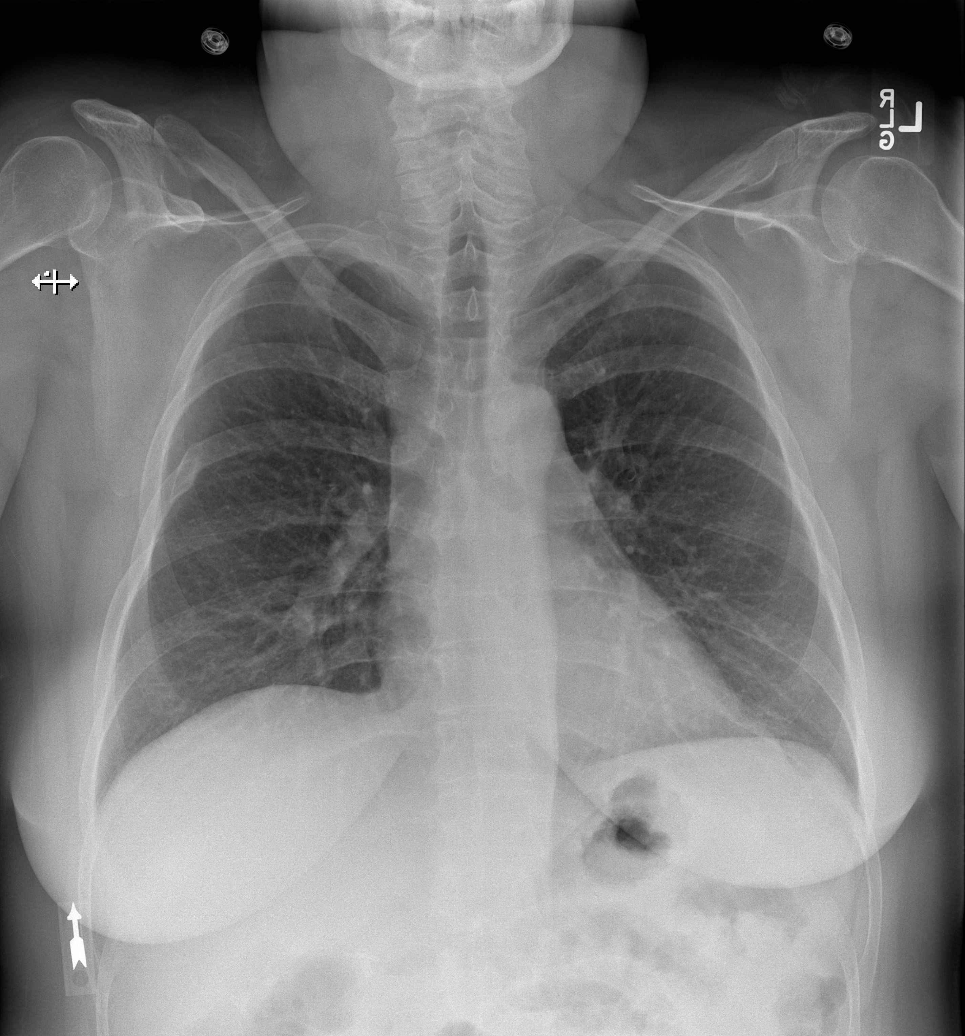

[w abdomen upright]
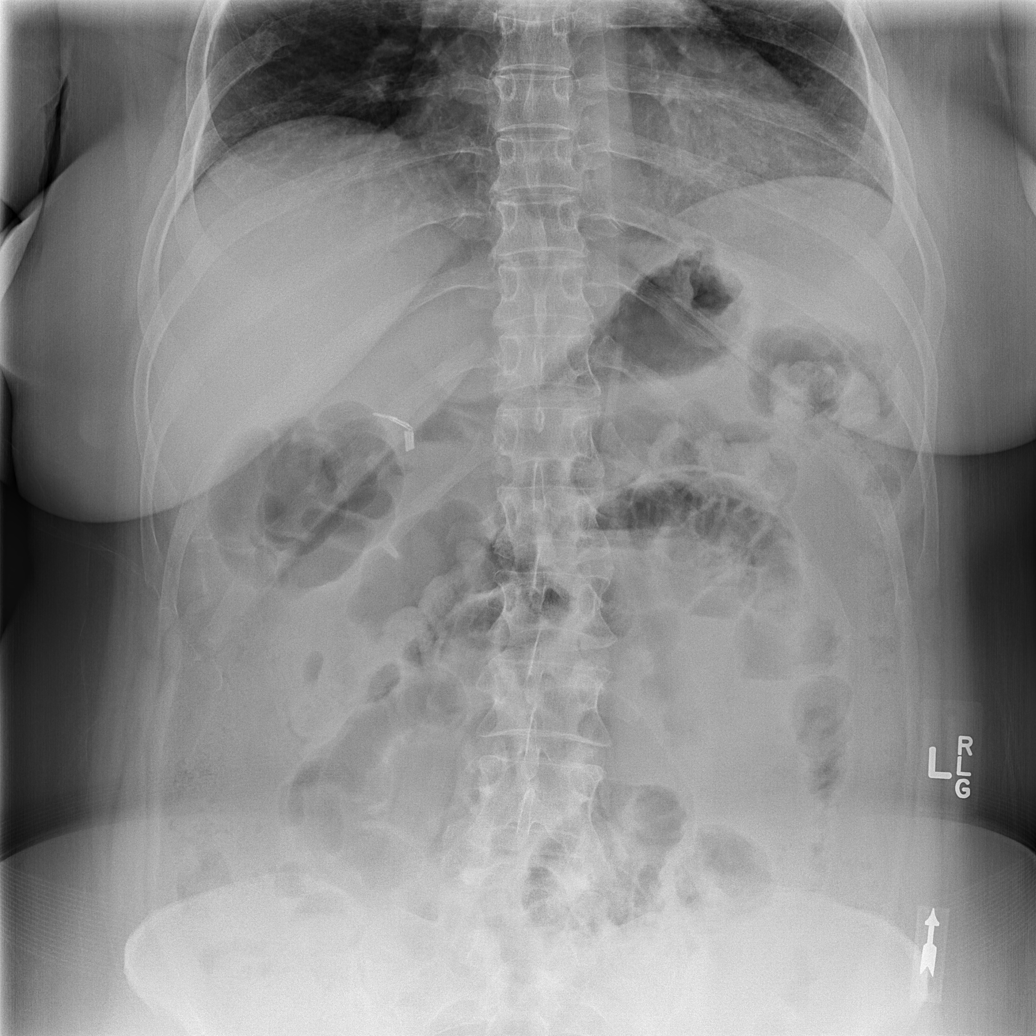

[t abdomen supine (1 of 2)]
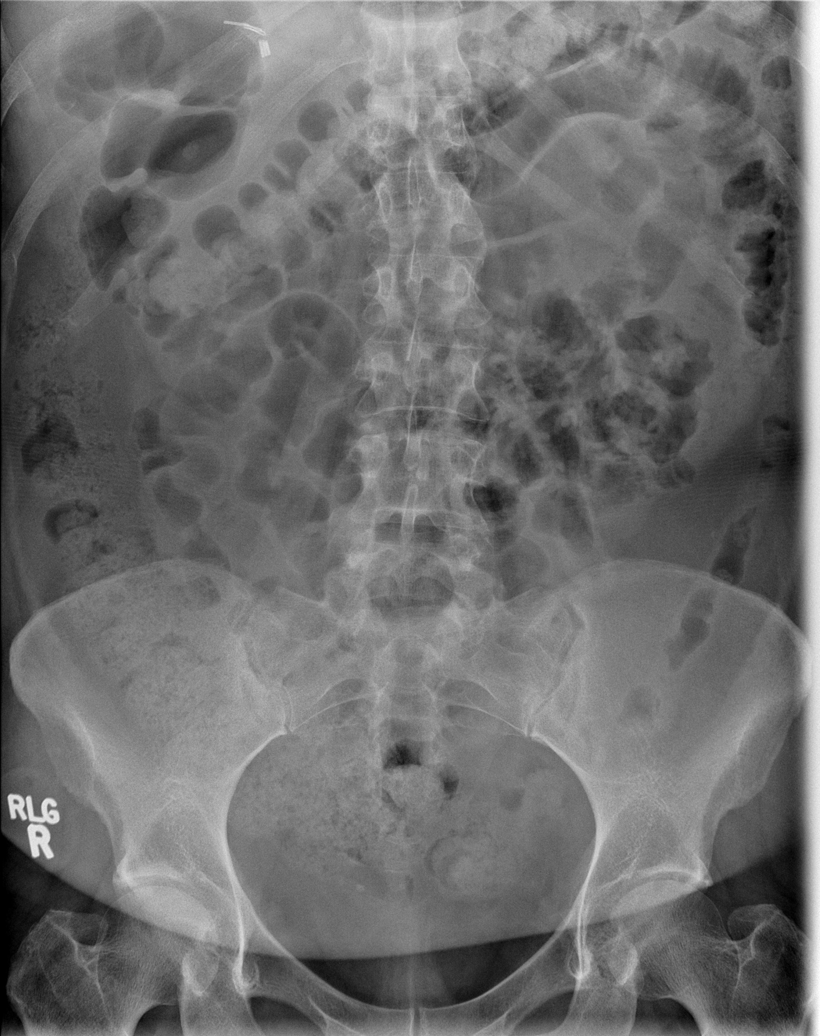

[t abdomen supine (2 of 2)]
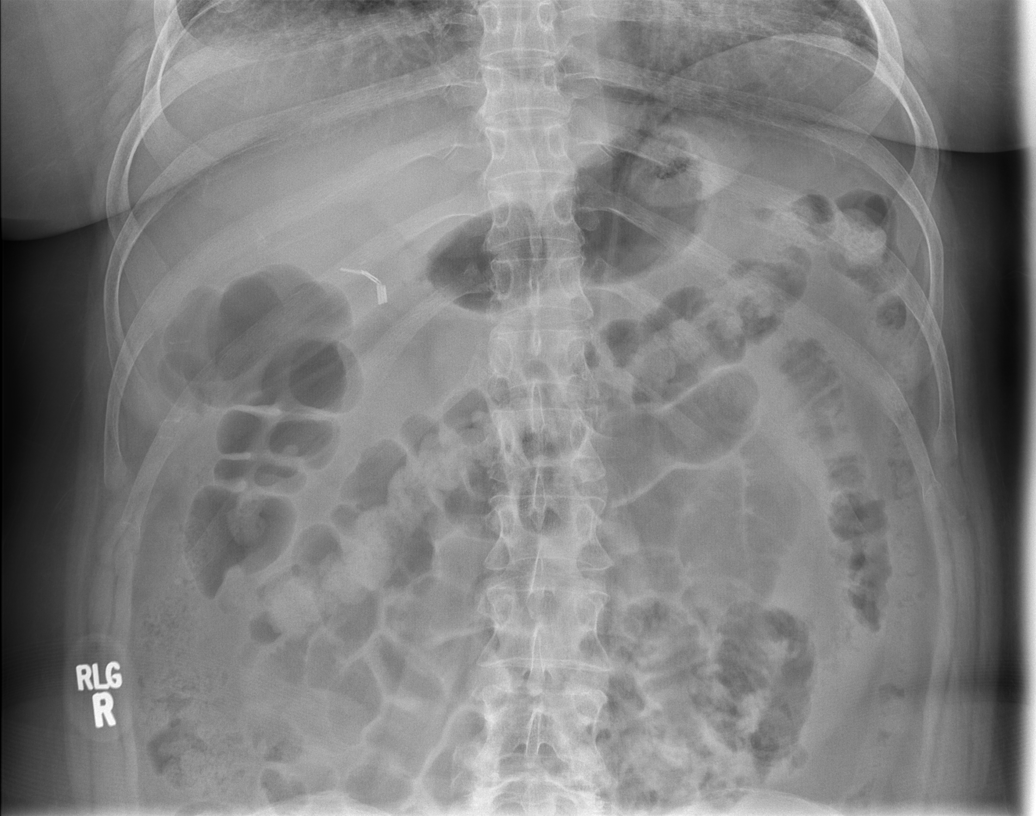

[4 of 4 positions shown; findings below may reference images not displayed]

FINDINGS: Single view of the chest demonstrates clear lungs and normal heart
size. No pneumothorax or pleural effusion. Remote right sixth rib
fracture is again seen.

Two views of the abdomen show no free intraperitoneal air. The bowel
gas pattern is nonobstructive. Cholecystectomy clips are noted. No
abnormal abdominal calcification is identified.
IMPRESSION: No acute finding chest or abdomen.

## 2016-12-06 DIAGNOSIS — R109 Unspecified abdominal pain: Secondary | ICD-10-CM | POA: Diagnosis not present

## 2016-12-06 DIAGNOSIS — Z885 Allergy status to narcotic agent status: Secondary | ICD-10-CM | POA: Diagnosis not present

## 2016-12-06 DIAGNOSIS — R220 Localized swelling, mass and lump, head: Secondary | ICD-10-CM | POA: Diagnosis not present

## 2016-12-06 DIAGNOSIS — G35 Multiple sclerosis: Secondary | ICD-10-CM | POA: Diagnosis not present

## 2016-12-06 DIAGNOSIS — Z880 Allergy status to penicillin: Secondary | ICD-10-CM | POA: Diagnosis not present

## 2016-12-06 DIAGNOSIS — R112 Nausea with vomiting, unspecified: Secondary | ICD-10-CM | POA: Diagnosis not present

## 2016-12-06 DIAGNOSIS — M797 Fibromyalgia: Secondary | ICD-10-CM | POA: Diagnosis not present

## 2016-12-06 DIAGNOSIS — Z882 Allergy status to sulfonamides status: Secondary | ICD-10-CM | POA: Diagnosis not present

## 2016-12-06 DIAGNOSIS — Z886 Allergy status to analgesic agent status: Secondary | ICD-10-CM | POA: Diagnosis not present

## 2016-12-06 DIAGNOSIS — R0600 Dyspnea, unspecified: Secondary | ICD-10-CM | POA: Diagnosis not present

## 2016-12-06 DIAGNOSIS — Z888 Allergy status to other drugs, medicaments and biological substances status: Secondary | ICD-10-CM | POA: Diagnosis not present

## 2016-12-06 DIAGNOSIS — R0789 Other chest pain: Secondary | ICD-10-CM | POA: Diagnosis not present

## 2016-12-06 DIAGNOSIS — Z8249 Family history of ischemic heart disease and other diseases of the circulatory system: Secondary | ICD-10-CM | POA: Diagnosis not present

## 2016-12-06 DIAGNOSIS — I10 Essential (primary) hypertension: Secondary | ICD-10-CM | POA: Diagnosis not present

## 2016-12-06 DIAGNOSIS — J45909 Unspecified asthma, uncomplicated: Secondary | ICD-10-CM | POA: Diagnosis not present

## 2016-12-06 DIAGNOSIS — R05 Cough: Secondary | ICD-10-CM | POA: Diagnosis not present

## 2016-12-06 DIAGNOSIS — R61 Generalized hyperhidrosis: Secondary | ICD-10-CM | POA: Diagnosis not present

## 2016-12-06 DIAGNOSIS — R070 Pain in throat: Secondary | ICD-10-CM | POA: Diagnosis not present

## 2016-12-06 DIAGNOSIS — Z87891 Personal history of nicotine dependence: Secondary | ICD-10-CM | POA: Diagnosis not present

## 2016-12-06 DIAGNOSIS — R0602 Shortness of breath: Secondary | ICD-10-CM | POA: Diagnosis not present

## 2016-12-06 DIAGNOSIS — E785 Hyperlipidemia, unspecified: Secondary | ICD-10-CM | POA: Diagnosis not present

## 2016-12-06 DIAGNOSIS — Z9289 Personal history of other medical treatment: Secondary | ICD-10-CM | POA: Diagnosis not present

## 2016-12-07 ENCOUNTER — Ambulatory Visit (INDEPENDENT_AMBULATORY_CARE_PROVIDER_SITE_OTHER): Payer: Medicare Other | Admitting: Family Medicine

## 2016-12-07 ENCOUNTER — Encounter: Payer: Self-pay | Admitting: Family Medicine

## 2016-12-07 VITALS — BP 139/98 | HR 100 | Ht 62.0 in | Wt 212.0 lb

## 2016-12-07 DIAGNOSIS — R1111 Vomiting without nausea: Secondary | ICD-10-CM

## 2016-12-07 DIAGNOSIS — R490 Dysphonia: Secondary | ICD-10-CM | POA: Diagnosis not present

## 2016-12-07 DIAGNOSIS — R221 Localized swelling, mass and lump, neck: Secondary | ICD-10-CM

## 2016-12-07 MED ORDER — DEXAMETHASONE SODIUM PHOSPHATE 4 MG/ML IJ SOLN
4.0000 mg | Freq: Once | INTRAMUSCULAR | Status: AC
  Administered 2016-12-07: 4 mg via INTRAMUSCULAR

## 2016-12-07 MED ORDER — DIPHENHYDRAMINE HCL 25 MG PO CAPS
50.0000 mg | ORAL_CAPSULE | Freq: Once | ORAL | Status: AC
  Administered 2016-12-07: 50 mg via ORAL
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
## 2016-12-08 ENCOUNTER — Encounter (HOSPITAL_BASED_OUTPATIENT_CLINIC_OR_DEPARTMENT_OTHER): Payer: Self-pay | Admitting: *Deleted

## 2016-12-08 ENCOUNTER — Emergency Department (HOSPITAL_BASED_OUTPATIENT_CLINIC_OR_DEPARTMENT_OTHER)
Admission: EM | Admit: 2016-12-08 | Discharge: 2016-12-08 | Disposition: A | Discharge status: Home / Self Care | Payer: Medicare Other | Attending: Emergency Medicine | Admitting: Emergency Medicine

## 2016-12-08 ENCOUNTER — Telehealth: Payer: Self-pay | Admitting: Family Medicine

## 2016-12-08 DIAGNOSIS — R112 Nausea with vomiting, unspecified: Secondary | ICD-10-CM | POA: Diagnosis not present

## 2016-12-08 DIAGNOSIS — I10 Essential (primary) hypertension: Secondary | ICD-10-CM | POA: Insufficient documentation

## 2016-12-08 DIAGNOSIS — Z87891 Personal history of nicotine dependence: Secondary | ICD-10-CM | POA: Insufficient documentation

## 2016-12-08 DIAGNOSIS — J392 Other diseases of pharynx: Secondary | ICD-10-CM

## 2016-12-08 DIAGNOSIS — R220 Localized swelling, mass and lump, head: Secondary | ICD-10-CM | POA: Diagnosis not present

## 2016-12-08 DIAGNOSIS — R221 Localized swelling, mass and lump, neck: Secondary | ICD-10-CM | POA: Insufficient documentation

## 2016-12-08 DIAGNOSIS — R0602 Shortness of breath: Secondary | ICD-10-CM | POA: Insufficient documentation

## 2016-12-08 DIAGNOSIS — R05 Cough: Secondary | ICD-10-CM | POA: Diagnosis not present

## 2016-12-08 DIAGNOSIS — Z79899 Other long term (current) drug therapy: Secondary | ICD-10-CM | POA: Insufficient documentation

## 2016-12-08 DIAGNOSIS — M542 Cervicalgia: Secondary | ICD-10-CM | POA: Diagnosis not present

## 2016-12-08 DIAGNOSIS — J45909 Unspecified asthma, uncomplicated: Secondary | ICD-10-CM | POA: Diagnosis not present

## 2016-12-08 DIAGNOSIS — M791 Myalgia: Secondary | ICD-10-CM | POA: Diagnosis not present

## 2016-12-08 MED ORDER — ONDANSETRON 4 MG PO TBDP
4.0000 mg | ORAL_TABLET | Freq: Once | ORAL | Status: AC
  Administered 2016-12-08: 4 mg via ORAL
  Filled 2016-12-08: qty 1

## 2016-12-08 MED ORDER — DEXAMETHASONE 4 MG PO TABS: 4.0000 mg | ORAL_TABLET | Freq: Two times a day (BID) | ORAL | 0 refills | Status: DC

## 2016-12-08 MED ORDER — DIPHENHYDRAMINE HCL 25 MG PO CAPS
25.0000 mg | ORAL_CAPSULE | Freq: Once | ORAL | Status: AC
  Administered 2016-12-08: 25 mg via ORAL
  Filled 2016-12-08: qty 1

## 2016-12-08 NOTE — Telephone Encounter (Signed)
Pt called for update. Advised we would contact her once PCP responds. Pt states she is at the surgical center wit her son who had surgery today. If she doesn't answer her phone she request a detailed voicemail. Does report she has not started the steroid yet, and cant take benadryl until her son is stabilized and gets to come home.

## 2016-12-08 NOTE — Telephone Encounter (Signed)
Patient has dexamathasone 4mg . She has ten tablets left. She states she is not feeling any better and she says her neck is still "puffy." She wants to know how she should take the remainder of her medication(it is an old prescription from something else) or if you will call in a new prescription steroid.

## 2016-12-08 NOTE — Discharge Instructions (Signed)
Take dexamethasone as prescribed for the next 5 days. Benadryl and zofran as needed. Follow up with your PCP for reevaluation next week and keep your appointment for the upper endoscopy. Return to the ED with any concerning symptoms.

## 2016-12-08 NOTE — ED Triage Notes (Signed)
Pt reports neck swelling xseveral days. Reports seeing PCP yesterday and receiving steroid shot and Benadryl. Pt able to speaking in complete sentences. NAD noted at this time. Also reports n/v.
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
## 2016-12-08 NOTE — ED Notes (Signed)
Pt discharged to home NAD.  

## 2016-12-08 NOTE — Telephone Encounter (Signed)
We can give her oral dexamethasone rx.  Someone else in chart so unable to send.

## 2016-12-08 NOTE — ED Notes (Signed)
Pt reports pain to her throat for 3 days, she has been having trouble eating, where she will spit up food minutes after. Pt reports increased saliva and drooling when she sleeps. Pt also reports increased SOB. Pt in NAD at this time.

## 2016-12-08 NOTE — ED Notes (Signed)
ED Provider at bedside. 

## 2016-12-08 NOTE — Telephone Encounter (Signed)
Please call and check on her today and see if she is feeling a little better.

## 2016-12-08 NOTE — ED Notes (Signed)
RT in to assess pt.  

## 2016-12-08 NOTE — Telephone Encounter (Signed)
Left VM for Pt to return clinic call for update.

## 2016-12-11 DIAGNOSIS — E876 Hypokalemia: Secondary | ICD-10-CM | POA: Diagnosis not present

## 2016-12-11 DIAGNOSIS — R0602 Shortness of breath: Secondary | ICD-10-CM | POA: Diagnosis not present

## 2016-12-11 DIAGNOSIS — K5900 Constipation, unspecified: Secondary | ICD-10-CM | POA: Diagnosis not present

## 2016-12-11 DIAGNOSIS — R002 Palpitations: Secondary | ICD-10-CM | POA: Diagnosis not present

## 2016-12-11 DIAGNOSIS — R112 Nausea with vomiting, unspecified: Secondary | ICD-10-CM | POA: Diagnosis not present

## 2016-12-11 DIAGNOSIS — R531 Weakness: Secondary | ICD-10-CM | POA: Diagnosis not present

## 2016-12-11 DIAGNOSIS — R079 Chest pain, unspecified: Secondary | ICD-10-CM | POA: Diagnosis not present

## 2016-12-11 DIAGNOSIS — R61 Generalized hyperhidrosis: Secondary | ICD-10-CM | POA: Diagnosis not present

## 2016-12-11 DIAGNOSIS — R0600 Dyspnea, unspecified: Secondary | ICD-10-CM | POA: Diagnosis not present

## 2016-12-11 DIAGNOSIS — Z79899 Other long term (current) drug therapy: Secondary | ICD-10-CM | POA: Diagnosis not present

## 2016-12-11 DIAGNOSIS — I10 Essential (primary) hypertension: Secondary | ICD-10-CM | POA: Diagnosis not present

## 2016-12-11 NOTE — Telephone Encounter (Signed)
Patient went to ED. See progress note

## 2016-12-12 ENCOUNTER — Encounter: Payer: Self-pay | Admitting: Family Medicine

## 2016-12-12 ENCOUNTER — Ambulatory Visit (INDEPENDENT_AMBULATORY_CARE_PROVIDER_SITE_OTHER): Payer: Medicare Other | Admitting: Family Medicine

## 2016-12-12 VITALS — BP 142/88 | HR 86 | Ht 62.0 in

## 2016-12-12 DIAGNOSIS — I10 Essential (primary) hypertension: Secondary | ICD-10-CM

## 2016-12-12 DIAGNOSIS — J301 Allergic rhinitis due to pollen: Secondary | ICD-10-CM

## 2016-12-12 DIAGNOSIS — R05 Cough: Secondary | ICD-10-CM | POA: Diagnosis not present

## 2016-12-12 DIAGNOSIS — K220 Achalasia of cardia: Secondary | ICD-10-CM | POA: Diagnosis not present

## 2016-12-12 DIAGNOSIS — R7301 Impaired fasting glucose: Secondary | ICD-10-CM | POA: Diagnosis not present

## 2016-12-12 DIAGNOSIS — R221 Localized swelling, mass and lump, neck: Secondary | ICD-10-CM

## 2016-12-12 DIAGNOSIS — R1310 Dysphagia, unspecified: Secondary | ICD-10-CM | POA: Diagnosis not present

## 2016-12-12 MED ORDER — RANITIDINE HCL 150 MG PO CAPS: 150.0000 mg | ORAL_CAPSULE | Freq: Two times a day (BID) | ORAL | 1 refills | Status: DC

## 2016-12-12 MED ORDER — MOMETASONE FUROATE 50 MCG/ACT NA SUSP: 2.0000 | Freq: Every day | NASAL | 12 refills | Status: DC

## 2016-12-12 NOTE — Patient Instructions (Signed)
Avoid all dairy products. Restart her ranitidine. Okay to increase labetalol to whole tab in the morning and half tablet in the evening. Really push liquids and fluids today to moisturize her mouth and to hydrate.

## 2016-12-12 NOTE — Progress Notes (Signed)
Subjective:    CC: throat swollen.    HPI:  Patient comes in still complaining of feeling a sensation that her throat is swollen.  Today she feels like her mouth is very dry and that her lip is swollen and almost like her tongue is bitten side of her mouth. That she's not noticing indentations on the side of her tongue. Check she went to have a dental cleaning done yesterday. It was just a typical cleaning. But today she just looks like her mouth is excessively dry. She's been experiencing breakouts of sweats but this is been going on for several weeks. She also was having an episode of palpitations that she went to the emergency department a couple days ago. She was given to patient yesterday by the dentist called [facility] to take as needed to help with her gums. She been expressing some soreness and thought she might be getting a cavity but it turned out it was just a little bit of gum irritation.   Allergic rhinitis-she prefers Nasonex as Flonase tends to give her more nosebleeds. She wants to know if it can be cheaper if we read it as a prescription instead of buying over-the-counter she feels like she's been in a lot of money on it. She still been doing her nasal rinses. She has noticed a slight increase since her nasal congestion.  He also reports that she's had some elevated blood sugars. She had one that was 218 the other day but she has been taking dexamethasone which could certainly cause an elevation in her blood sugars.  Past medical history, Surgical history, Family history not pertinant except as noted below, Social history, Allergies, and medications have been entered into the medical record, reviewed, and corrections made.   Review of Systems: No fevers, chills, night sweats, weight loss, chest pain, or shortness of breath.   Objective:    General: Well Developed, well nourished, and in no acute distress.  Neuro: Alert and oriented x3, extra-ocular muscles intact, sensation  grossly intact.  HEENT: Normocephalic, atraumatic  Skin: Warm and dry, no rashes. Cardiac: Regular rate and rhythm, no murmurs rubs or gallops, no lower extremity edema.  Respiratory: Clear to auscultation bilaterally. Not using accessory muscles, speaking in full sentences.   Impression and Recommendations:   Throat swelling - recommend restart ranitidine.  Reflux could be aggrevating sxs.  Hold off on using the Pre[facility]ident for now. Can restart when feeling better.  Work on hydrating the mouth well with lots of rinsing of water in the mouth today. She can also consider some over-the-counter products such as Biotene to help moisturize the mouth. Also discussed that I want her to avoid all dairy. In the past the specimen is been a big trigger for the sensation of fullness and thickness in her throat as well as increased mucus production.   AR- will try to send nasonex to pharmacy as a script.    Elevated blood glucose-she does have a diagnosis of impaired fasting glucose. Her last him 11 A1c was 5.9 about 3 weeks ago on April 5. Just make sure drinking plain fluid and really monitoring diet. + Suspect that the recent dosing of dexamethasone has probably been bumping her blood sugars.  HTN - have her increase her labetalol to a whole tab in the morning and a half a tab in the evening.

## 2016-12-13 ENCOUNTER — Encounter: Payer: Self-pay | Admitting: Family Medicine

## 2016-12-14 ENCOUNTER — Ambulatory Visit (INDEPENDENT_AMBULATORY_CARE_PROVIDER_SITE_OTHER): Payer: Medicare Other | Admitting: Family Medicine

## 2016-12-14 ENCOUNTER — Ambulatory Visit: Payer: Medicare Other | Admitting: Family Medicine

## 2016-12-14 ENCOUNTER — Encounter: Payer: Self-pay | Admitting: Family Medicine

## 2016-12-14 VITALS — BP 125/78 | HR 113 | Ht 62.0 in | Wt 211.0 lb

## 2016-12-14 DIAGNOSIS — F419 Anxiety disorder, unspecified: Secondary | ICD-10-CM

## 2016-12-14 DIAGNOSIS — D3617 Benign neoplasm of peripheral nerves and autonomic nervous system of trunk, unspecified: Secondary | ICD-10-CM | POA: Diagnosis not present

## 2016-12-14 DIAGNOSIS — D229 Melanocytic nevi, unspecified: Secondary | ICD-10-CM

## 2016-12-14 DIAGNOSIS — L989 Disorder of the skin and subcutaneous tissue, unspecified: Secondary | ICD-10-CM | POA: Diagnosis not present

## 2016-12-14 DIAGNOSIS — I10 Essential (primary) hypertension: Secondary | ICD-10-CM

## 2016-12-14 DIAGNOSIS — J301 Allergic rhinitis due to pollen: Secondary | ICD-10-CM | POA: Diagnosis not present
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
## 2016-12-15 ENCOUNTER — Other Ambulatory Visit: Payer: Self-pay

## 2016-12-15 MED ORDER — LABETALOL HCL 100 MG PO TABS: ORAL_TABLET | ORAL | 3 refills | Status: DC

## 2016-12-15 NOTE — Telephone Encounter (Signed)
Patient called request a new script for Labetalol 100 mg with the instructions Take 100 mg in the morning and a half tablet in the evening.  Patient stated that she was told by PCP to increase her dose to 100 mg in the morning and 50 mg at night. Please see OV note below and advise if a new Rx is approved.  Durwin Davisson,CMA    Instructions      Return if symptoms worsen or fail to improve.  Avoid all dairy products. Restart her ranitidine. Okay to increase labetalol to whole tab in the morning and half tablet in the evening. Really push liquids and fluids today to moisturize her mouth and to hydrate.

## 2016-12-15 NOTE — Telephone Encounter (Signed)
OK, new rx sent with new instructions.

## 2016-12-16 ENCOUNTER — Emergency Department (HOSPITAL_BASED_OUTPATIENT_CLINIC_OR_DEPARTMENT_OTHER)
Admission: EM | Admit: 2016-12-16 | Discharge: 2016-12-16 | Disposition: A | Discharge status: Home / Self Care | Payer: Medicare Other | Attending: Emergency Medicine | Admitting: Emergency Medicine

## 2016-12-16 ENCOUNTER — Emergency Department (HOSPITAL_BASED_OUTPATIENT_CLINIC_OR_DEPARTMENT_OTHER): Payer: Medicare Other

## 2016-12-16 ENCOUNTER — Encounter (HOSPITAL_BASED_OUTPATIENT_CLINIC_OR_DEPARTMENT_OTHER): Payer: Self-pay | Admitting: Emergency Medicine

## 2016-12-16 DIAGNOSIS — Z87891 Personal history of nicotine dependence: Secondary | ICD-10-CM | POA: Diagnosis not present

## 2016-12-16 DIAGNOSIS — R0602 Shortness of breath: Secondary | ICD-10-CM | POA: Diagnosis not present

## 2016-12-16 DIAGNOSIS — R002 Palpitations: Secondary | ICD-10-CM | POA: Diagnosis not present

## 2016-12-16 DIAGNOSIS — J45909 Unspecified asthma, uncomplicated: Secondary | ICD-10-CM | POA: Insufficient documentation

## 2016-12-16 DIAGNOSIS — R42 Dizziness and giddiness: Secondary | ICD-10-CM | POA: Diagnosis not present

## 2016-12-16 DIAGNOSIS — R0789 Other chest pain: Secondary | ICD-10-CM | POA: Insufficient documentation

## 2016-12-16 DIAGNOSIS — I10 Essential (primary) hypertension: Secondary | ICD-10-CM | POA: Insufficient documentation

## 2016-12-16 DIAGNOSIS — Z79899 Other long term (current) drug therapy: Secondary | ICD-10-CM | POA: Diagnosis not present

## 2016-12-16 DIAGNOSIS — R079 Chest pain, unspecified: Secondary | ICD-10-CM | POA: Diagnosis not present

## 2016-12-16 LAB — COMPREHENSIVE METABOLIC PANEL
ALT: 23 U/L (ref 14–54)
AST: 17 U/L (ref 15–41)
Albumin: 3.6 g/dL (ref 3.5–5.0)
Alkaline Phosphatase: 73 U/L (ref 38–126)
Anion gap: 7 (ref 5–15)
BUN: 13 mg/dL (ref 6–20)
CO2: 24 mmol/L (ref 22–32)
Calcium: 8.9 mg/dL (ref 8.9–10.3)
Chloride: 109 mmol/L (ref 101–111)
Creatinine, Ser: 0.8 mg/dL (ref 0.44–1.00)
GFR calc Af Amer: >60 mL/min (ref >60)
GFR calc non Af Amer: >60 mL/min (ref >60)
Glucose, Bld: 135 mg/dL — ABNORMAL HIGH (ref 65–99)
Potassium: 3.6 mmol/L (ref 3.5–5.1)
Sodium: 140 mmol/L (ref 135–145)
Total Bilirubin: 0.3 mg/dL (ref 0.3–1.2)
Total Protein: 6.6 g/dL (ref 6.5–8.1)

## 2016-12-16 LAB — CBC WITH DIFFERENTIAL/PLATELET
Basophils Absolute: 0 10*3/uL (ref 0.0–0.1)
Basophils Relative: 1 %
Eosinophils Absolute: 0.2 10*3/uL (ref 0.0–0.7)
Eosinophils Relative: 2 %
HCT: 40.9 % (ref 36.0–46.0)
Hemoglobin: 13.4 g/dL (ref 12.0–15.0)
Lymphocytes Relative: 18 %
Lymphs Abs: 1.4 10*3/uL (ref 0.7–4.0)
MCH: 29.5 pg (ref 26.0–34.0)
MCHC: 32.8 g/dL (ref 30.0–36.0)
MCV: 89.9 fL (ref 78.0–100.0)
Monocytes Absolute: 0.9 10*3/uL (ref 0.1–1.0)
Monocytes Relative: 11 %
Neutro Abs: 5.6 10*3/uL (ref 1.7–7.7)
Neutrophils Relative %: 68 %
Platelets: 187 10*3/uL (ref 150–400)
RBC: 4.55 MIL/uL (ref 3.87–5.11)
RDW: 14.2 % (ref 11.5–15.5)
WBC: 8.1 10*3/uL (ref 4.0–10.5)

## 2016-12-16 LAB — D-DIMER, QUANTITATIVE: D-Dimer, Quant: 0.28 ug/mL-FEU (ref 0.00–0.50)

## 2016-12-16 LAB — TROPONIN I
Troponin I: <0.03 ng/mL (ref <0.03)
Troponin I: <0.03 ng/mL (ref <0.03)

## 2016-12-16 MED ORDER — LORAZEPAM 1 MG PO TABS
1.0000 mg | ORAL_TABLET | Freq: Once | ORAL | Status: DC
  Filled 2016-12-16: qty 1

## 2016-12-16 NOTE — ED Notes (Signed)
Blood draws done by EMT Lilia Pro

## 2016-12-16 NOTE — ED Notes (Signed)
Redraw trop again
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
## 2016-12-16 NOTE — ED Triage Notes (Addendum)
States," Was folding clothes and started sweating and hurting real bad to my chest and then I used my little pulse ox and my heart rate was 130 " Took Labetalol 100mg  x 30 min ago

## 2016-12-19 ENCOUNTER — Other Ambulatory Visit: Payer: Self-pay

## 2016-12-19 DIAGNOSIS — T7840XA Allergy, unspecified, initial encounter: Secondary | ICD-10-CM | POA: Diagnosis not present

## 2016-12-19 DIAGNOSIS — Z885 Allergy status to narcotic agent status: Secondary | ICD-10-CM | POA: Diagnosis not present

## 2016-12-19 DIAGNOSIS — Z880 Allergy status to penicillin: Secondary | ICD-10-CM | POA: Diagnosis not present

## 2016-12-19 DIAGNOSIS — Z87891 Personal history of nicotine dependence: Secondary | ICD-10-CM | POA: Diagnosis not present

## 2016-12-19 DIAGNOSIS — J029 Acute pharyngitis, unspecified: Secondary | ICD-10-CM | POA: Diagnosis not present

## 2016-12-19 DIAGNOSIS — T498X5A Adverse effect of other topical agents, initial encounter: Secondary | ICD-10-CM | POA: Diagnosis not present

## 2016-12-19 DIAGNOSIS — R05 Cough: Secondary | ICD-10-CM | POA: Diagnosis not present

## 2016-12-19 DIAGNOSIS — Z882 Allergy status to sulfonamides status: Secondary | ICD-10-CM | POA: Diagnosis not present

## 2016-12-19 DIAGNOSIS — E785 Hyperlipidemia, unspecified: Secondary | ICD-10-CM | POA: Diagnosis not present

## 2016-12-19 DIAGNOSIS — J45909 Unspecified asthma, uncomplicated: Secondary | ICD-10-CM | POA: Diagnosis not present

## 2016-12-19 DIAGNOSIS — M797 Fibromyalgia: Secondary | ICD-10-CM | POA: Diagnosis not present

## 2016-12-19 DIAGNOSIS — Z886 Allergy status to analgesic agent status: Secondary | ICD-10-CM | POA: Diagnosis not present

## 2016-12-19 DIAGNOSIS — I10 Essential (primary) hypertension: Secondary | ICD-10-CM | POA: Diagnosis not present

## 2016-12-19 DIAGNOSIS — G35 Multiple sclerosis: Secondary | ICD-10-CM | POA: Diagnosis not present

## 2016-12-19 DIAGNOSIS — Z79899 Other long term (current) drug therapy: Secondary | ICD-10-CM | POA: Diagnosis not present

## 2016-12-19 MED ORDER — LABETALOL HCL 100 MG PO TABS: ORAL_TABLET | ORAL | 3 refills | Status: DC

## 2016-12-21 ENCOUNTER — Telehealth: Payer: Self-pay

## 2016-12-21 ENCOUNTER — Other Ambulatory Visit: Payer: Self-pay

## 2016-12-21 MED ORDER — MOMETASONE FUROATE 50 MCG/ACT NA SUSP: 2.0000 | Freq: Every day | NASAL | 12 refills | Status: DC

## 2016-12-21 NOTE — Telephone Encounter (Signed)
Pt called and said she has congestion and a sore throat.  Please advise.

## 2016-12-21 NOTE — Telephone Encounter (Signed)
We can schedule her for tomorrow.

## 2016-12-22 ENCOUNTER — Encounter (HOSPITAL_BASED_OUTPATIENT_CLINIC_OR_DEPARTMENT_OTHER): Payer: Self-pay | Admitting: Emergency Medicine

## 2016-12-22 ENCOUNTER — Emergency Department (HOSPITAL_BASED_OUTPATIENT_CLINIC_OR_DEPARTMENT_OTHER)
Admission: EM | Admit: 2016-12-22 | Discharge: 2016-12-22 | Disposition: A | Discharge status: Home / Self Care | Payer: Medicare Other | Attending: Emergency Medicine | Admitting: Emergency Medicine

## 2016-12-22 DIAGNOSIS — Z79899 Other long term (current) drug therapy: Secondary | ICD-10-CM | POA: Insufficient documentation

## 2016-12-22 DIAGNOSIS — Z87891 Personal history of nicotine dependence: Secondary | ICD-10-CM | POA: Insufficient documentation

## 2016-12-22 DIAGNOSIS — J45909 Unspecified asthma, uncomplicated: Secondary | ICD-10-CM | POA: Insufficient documentation

## 2016-12-22 DIAGNOSIS — I10 Essential (primary) hypertension: Secondary | ICD-10-CM | POA: Diagnosis not present

## 2016-12-22 DIAGNOSIS — R0981 Nasal congestion: Secondary | ICD-10-CM | POA: Diagnosis not present

## 2016-12-22 MED ORDER — CLINDAMYCIN HCL 150 MG PO CAPS: 150.0000 mg | ORAL_CAPSULE | Freq: Four times a day (QID) | ORAL | 0 refills | Status: DC

## 2016-12-22 MED ORDER — MOMETASONE FUROATE 50 MCG/ACT NA SUSP: 2.0000 | Freq: Every day | NASAL | 12 refills | Status: DC

## 2016-12-22 NOTE — ED Triage Notes (Signed)
Patient in the waiting room talking to this RN about her complaint, triage at Nurse first desk. The patient reports that she has nasal congestion. She has had this since she was here last time, patient states that she had tried several OTC medications. The patient reports that she has gone to her DR but nothing has helped

## 2016-12-22 NOTE — Discharge Instructions (Signed)
Take Nasonex as needed. Continue with nasal lavages, allergy medications, and humidifier for comfort. Wait to start Cleocin if symptoms do not improve in 2 days. Follow with her primary care for reevaluation. Return to the ED if you develop any concerning symptoms.

## 2016-12-22 NOTE — Telephone Encounter (Signed)
Pt notified & transferred to scheduling.
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
## 2016-12-27 DIAGNOSIS — R109 Unspecified abdominal pain: Secondary | ICD-10-CM | POA: Diagnosis not present

## 2016-12-27 DIAGNOSIS — Z882 Allergy status to sulfonamides status: Secondary | ICD-10-CM | POA: Diagnosis not present

## 2016-12-27 DIAGNOSIS — R1084 Generalized abdominal pain: Secondary | ICD-10-CM | POA: Diagnosis not present

## 2016-12-27 DIAGNOSIS — M797 Fibromyalgia: Secondary | ICD-10-CM | POA: Diagnosis not present

## 2016-12-27 DIAGNOSIS — I10 Essential (primary) hypertension: Secondary | ICD-10-CM | POA: Diagnosis not present

## 2016-12-27 DIAGNOSIS — Z9289 Personal history of other medical treatment: Secondary | ICD-10-CM | POA: Diagnosis not present

## 2016-12-27 DIAGNOSIS — G35 Multiple sclerosis: Secondary | ICD-10-CM | POA: Diagnosis not present

## 2016-12-27 DIAGNOSIS — Z886 Allergy status to analgesic agent status: Secondary | ICD-10-CM | POA: Diagnosis not present

## 2016-12-27 DIAGNOSIS — J45909 Unspecified asthma, uncomplicated: Secondary | ICD-10-CM | POA: Diagnosis not present

## 2016-12-27 DIAGNOSIS — E785 Hyperlipidemia, unspecified: Secondary | ICD-10-CM | POA: Diagnosis not present

## 2016-12-27 DIAGNOSIS — G8929 Other chronic pain: Secondary | ICD-10-CM | POA: Diagnosis not present

## 2016-12-27 DIAGNOSIS — Z8249 Family history of ischemic heart disease and other diseases of the circulatory system: Secondary | ICD-10-CM | POA: Diagnosis not present

## 2016-12-27 DIAGNOSIS — Z87891 Personal history of nicotine dependence: Secondary | ICD-10-CM | POA: Diagnosis not present

## 2016-12-27 DIAGNOSIS — Z885 Allergy status to narcotic agent status: Secondary | ICD-10-CM | POA: Diagnosis not present

## 2016-12-27 DIAGNOSIS — J0100 Acute maxillary sinusitis, unspecified: Secondary | ICD-10-CM | POA: Diagnosis not present

## 2016-12-27 DIAGNOSIS — Z880 Allergy status to penicillin: Secondary | ICD-10-CM | POA: Diagnosis not present

## 2016-12-27 DIAGNOSIS — Z888 Allergy status to other drugs, medicaments and biological substances status: Secondary | ICD-10-CM | POA: Diagnosis not present

## 2016-12-27 DIAGNOSIS — R1010 Upper abdominal pain, unspecified: Secondary | ICD-10-CM | POA: Diagnosis not present

## 2016-12-28 ENCOUNTER — Ambulatory Visit (INDEPENDENT_AMBULATORY_CARE_PROVIDER_SITE_OTHER): Payer: Medicare Other | Admitting: Family Medicine

## 2016-12-28 ENCOUNTER — Encounter: Payer: Self-pay | Admitting: Family Medicine

## 2016-12-28 VITALS — BP 150/82 | HR 97 | Ht 62.0 in | Wt 210.0 lb

## 2016-12-28 DIAGNOSIS — G8929 Other chronic pain: Secondary | ICD-10-CM | POA: Diagnosis not present

## 2016-12-28 DIAGNOSIS — Z789 Other specified health status: Secondary | ICD-10-CM

## 2016-12-28 DIAGNOSIS — I10 Essential (primary) hypertension: Secondary | ICD-10-CM | POA: Diagnosis not present

## 2016-12-28 DIAGNOSIS — J0110 Acute frontal sinusitis, unspecified: Secondary | ICD-10-CM

## 2016-12-28 DIAGNOSIS — M545 Low back pain: Secondary | ICD-10-CM | POA: Diagnosis not present

## 2016-12-28 MED ORDER — CLINDAMYCIN HCL 75 MG PO CAPS: 75.0000 mg | ORAL_CAPSULE | Freq: Two times a day (BID) | ORAL | 0 refills | Status: DC

## 2016-12-28 MED ORDER — VALSARTAN 40 MG PO TABS: 20.0000 mg | ORAL_TABLET | Freq: Every day | ORAL | 0 refills | Status: DC
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
## 2016-12-29 ENCOUNTER — Ambulatory Visit: Payer: Medicare Other | Admitting: Family Medicine

## 2016-12-31 DIAGNOSIS — Z9049 Acquired absence of other specified parts of digestive tract: Secondary | ICD-10-CM | POA: Diagnosis not present

## 2016-12-31 DIAGNOSIS — G35 Multiple sclerosis: Secondary | ICD-10-CM | POA: Diagnosis not present

## 2016-12-31 DIAGNOSIS — R1013 Epigastric pain: Secondary | ICD-10-CM | POA: Diagnosis not present

## 2016-12-31 DIAGNOSIS — Z87891 Personal history of nicotine dependence: Secondary | ICD-10-CM | POA: Diagnosis not present

## 2016-12-31 DIAGNOSIS — M797 Fibromyalgia: Secondary | ICD-10-CM | POA: Diagnosis not present

## 2016-12-31 DIAGNOSIS — R12 Heartburn: Secondary | ICD-10-CM | POA: Diagnosis not present

## 2016-12-31 DIAGNOSIS — I10 Essential (primary) hypertension: Secondary | ICD-10-CM | POA: Diagnosis not present

## 2016-12-31 DIAGNOSIS — E785 Hyperlipidemia, unspecified: Secondary | ICD-10-CM | POA: Diagnosis not present

## 2016-12-31 DIAGNOSIS — J45909 Unspecified asthma, uncomplicated: Secondary | ICD-10-CM | POA: Diagnosis not present

## 2016-12-31 DIAGNOSIS — Z9889 Other specified postprocedural states: Secondary | ICD-10-CM | POA: Diagnosis not present

## 2017-01-02 DIAGNOSIS — R1110 Vomiting, unspecified: Secondary | ICD-10-CM | POA: Diagnosis not present

## 2017-01-02 DIAGNOSIS — R1310 Dysphagia, unspecified: Secondary | ICD-10-CM | POA: Diagnosis not present

## 2017-01-02 DIAGNOSIS — R1010 Upper abdominal pain, unspecified: Secondary | ICD-10-CM | POA: Diagnosis not present

## 2017-01-02 DIAGNOSIS — K5904 Chronic idiopathic constipation: Secondary | ICD-10-CM | POA: Diagnosis not present

## 2017-01-03 DIAGNOSIS — I10 Essential (primary) hypertension: Secondary | ICD-10-CM | POA: Diagnosis not present

## 2017-01-03 DIAGNOSIS — I471 Supraventricular tachycardia: Secondary | ICD-10-CM | POA: Diagnosis not present

## 2017-01-03 DIAGNOSIS — E269 Hyperaldosteronism, unspecified: Secondary | ICD-10-CM | POA: Diagnosis not present

## 2017-01-03 DIAGNOSIS — E785 Hyperlipidemia, unspecified: Secondary | ICD-10-CM | POA: Diagnosis not present

## 2017-01-03 DIAGNOSIS — Z881 Allergy status to other antibiotic agents status: Secondary | ICD-10-CM | POA: Diagnosis not present

## 2017-01-03 DIAGNOSIS — R0602 Shortness of breath: Secondary | ICD-10-CM | POA: Diagnosis not present

## 2017-01-03 DIAGNOSIS — R1012 Left upper quadrant pain: Secondary | ICD-10-CM | POA: Diagnosis not present

## 2017-01-03 DIAGNOSIS — Z9049 Acquired absence of other specified parts of digestive tract: Secondary | ICD-10-CM | POA: Diagnosis not present

## 2017-01-03 DIAGNOSIS — R1013 Epigastric pain: Secondary | ICD-10-CM | POA: Diagnosis not present

## 2017-01-03 DIAGNOSIS — R1011 Right upper quadrant pain: Secondary | ICD-10-CM | POA: Diagnosis not present

## 2017-01-03 DIAGNOSIS — R109 Unspecified abdominal pain: Secondary | ICD-10-CM | POA: Diagnosis not present

## 2017-01-03 DIAGNOSIS — G35 Multiple sclerosis: Secondary | ICD-10-CM | POA: Diagnosis not present

## 2017-01-03 DIAGNOSIS — Z79899 Other long term (current) drug therapy: Secondary | ICD-10-CM | POA: Diagnosis not present

## 2017-01-03 DIAGNOSIS — Z885 Allergy status to narcotic agent status: Secondary | ICD-10-CM | POA: Diagnosis not present

## 2017-01-03 DIAGNOSIS — K219 Gastro-esophageal reflux disease without esophagitis: Secondary | ICD-10-CM | POA: Diagnosis not present

## 2017-01-03 DIAGNOSIS — Z91018 Allergy to other foods: Secondary | ICD-10-CM | POA: Diagnosis not present

## 2017-01-03 DIAGNOSIS — Z882 Allergy status to sulfonamides status: Secondary | ICD-10-CM | POA: Diagnosis not present

## 2017-01-03 DIAGNOSIS — Z888 Allergy status to other drugs, medicaments and biological substances status: Secondary | ICD-10-CM | POA: Diagnosis not present

## 2017-01-03 DIAGNOSIS — Z87891 Personal history of nicotine dependence: Secondary | ICD-10-CM | POA: Diagnosis not present

## 2017-01-04 IMAGING — CR DG CHEST 2V
2 series · 2 of 2 positions shown · non-contrast
Comparison: September 10, 2015

CLINICAL DATA: Two day history of cough and congestion.
Tachycardia.

EXAM:
CHEST  2 VIEW

[w chest pa]
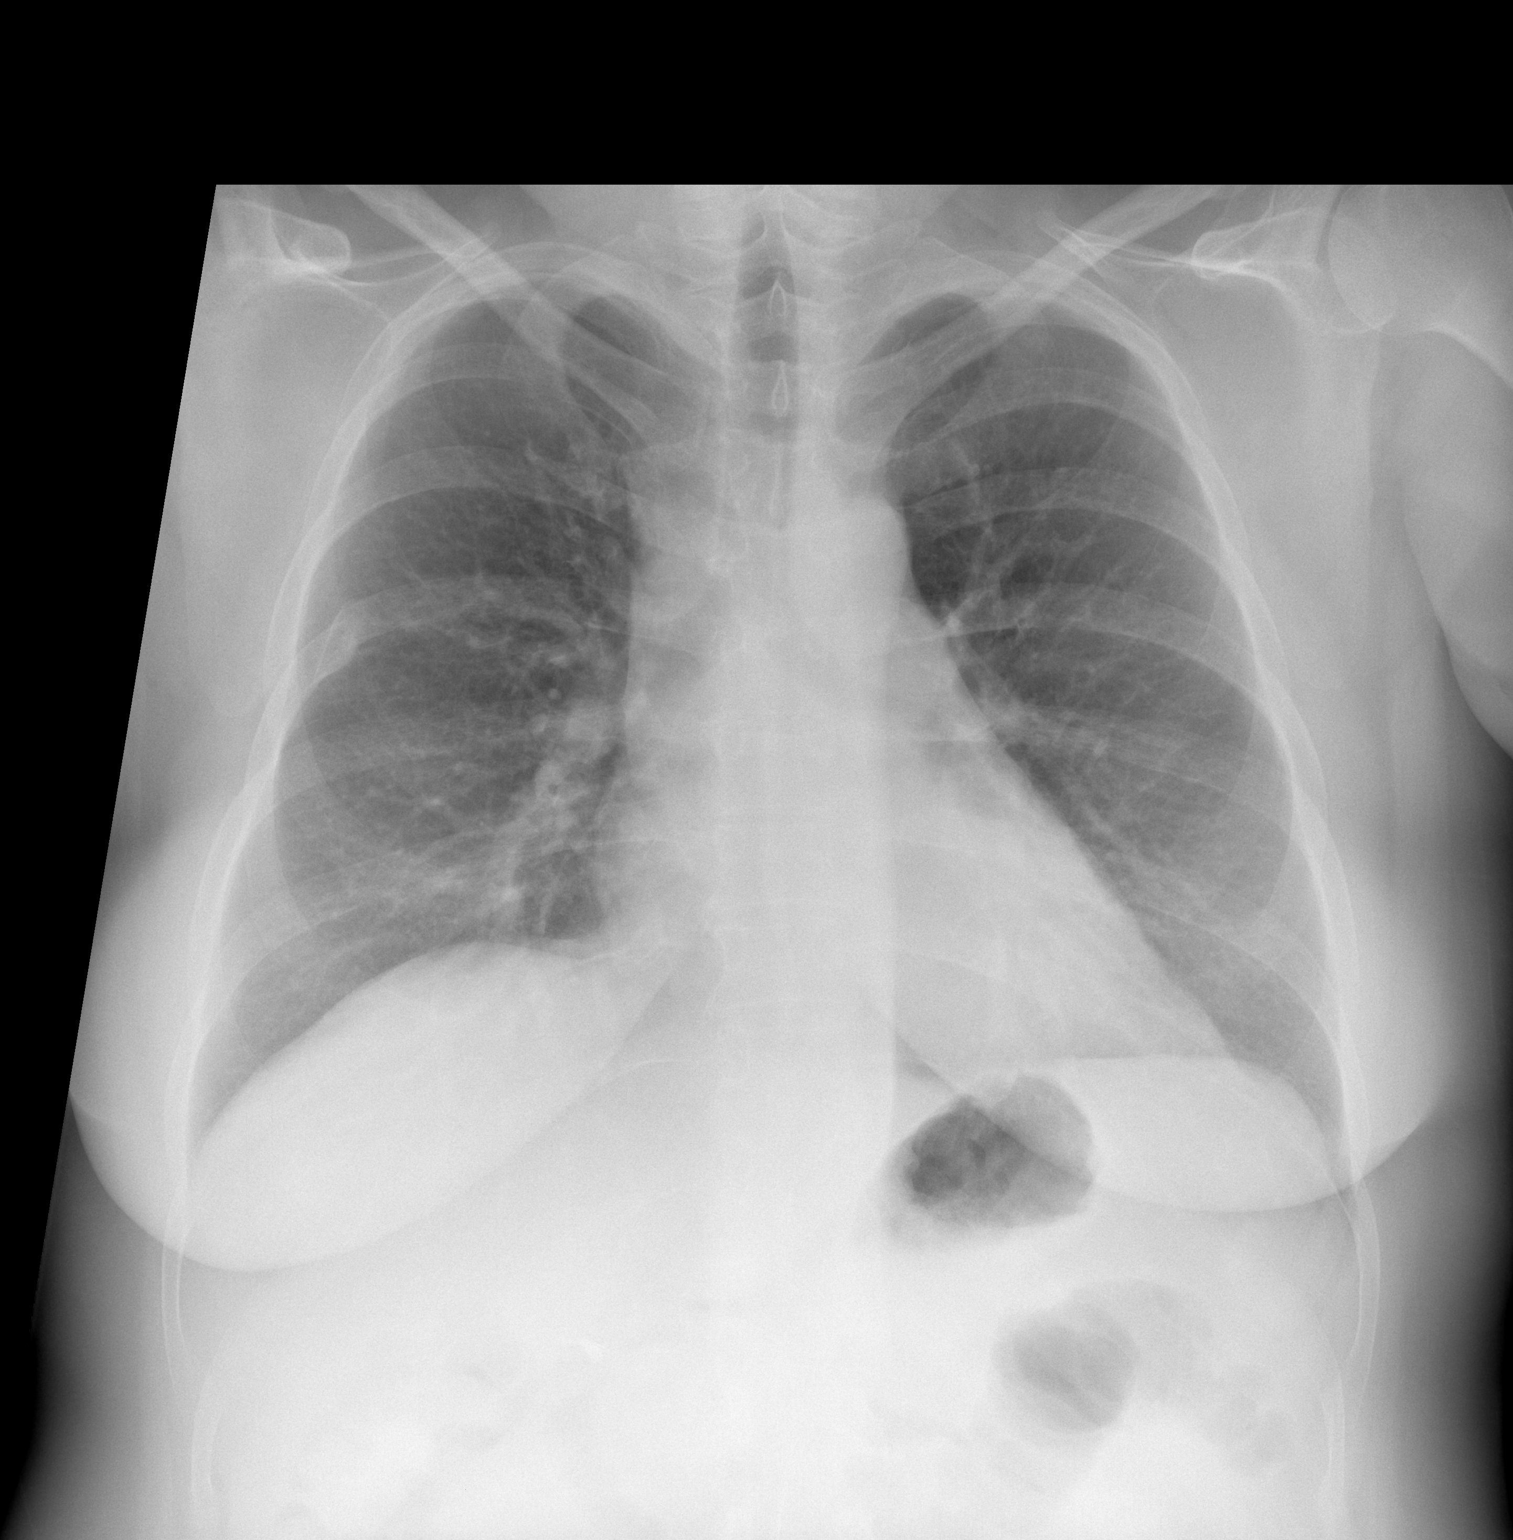

[w chest lat]
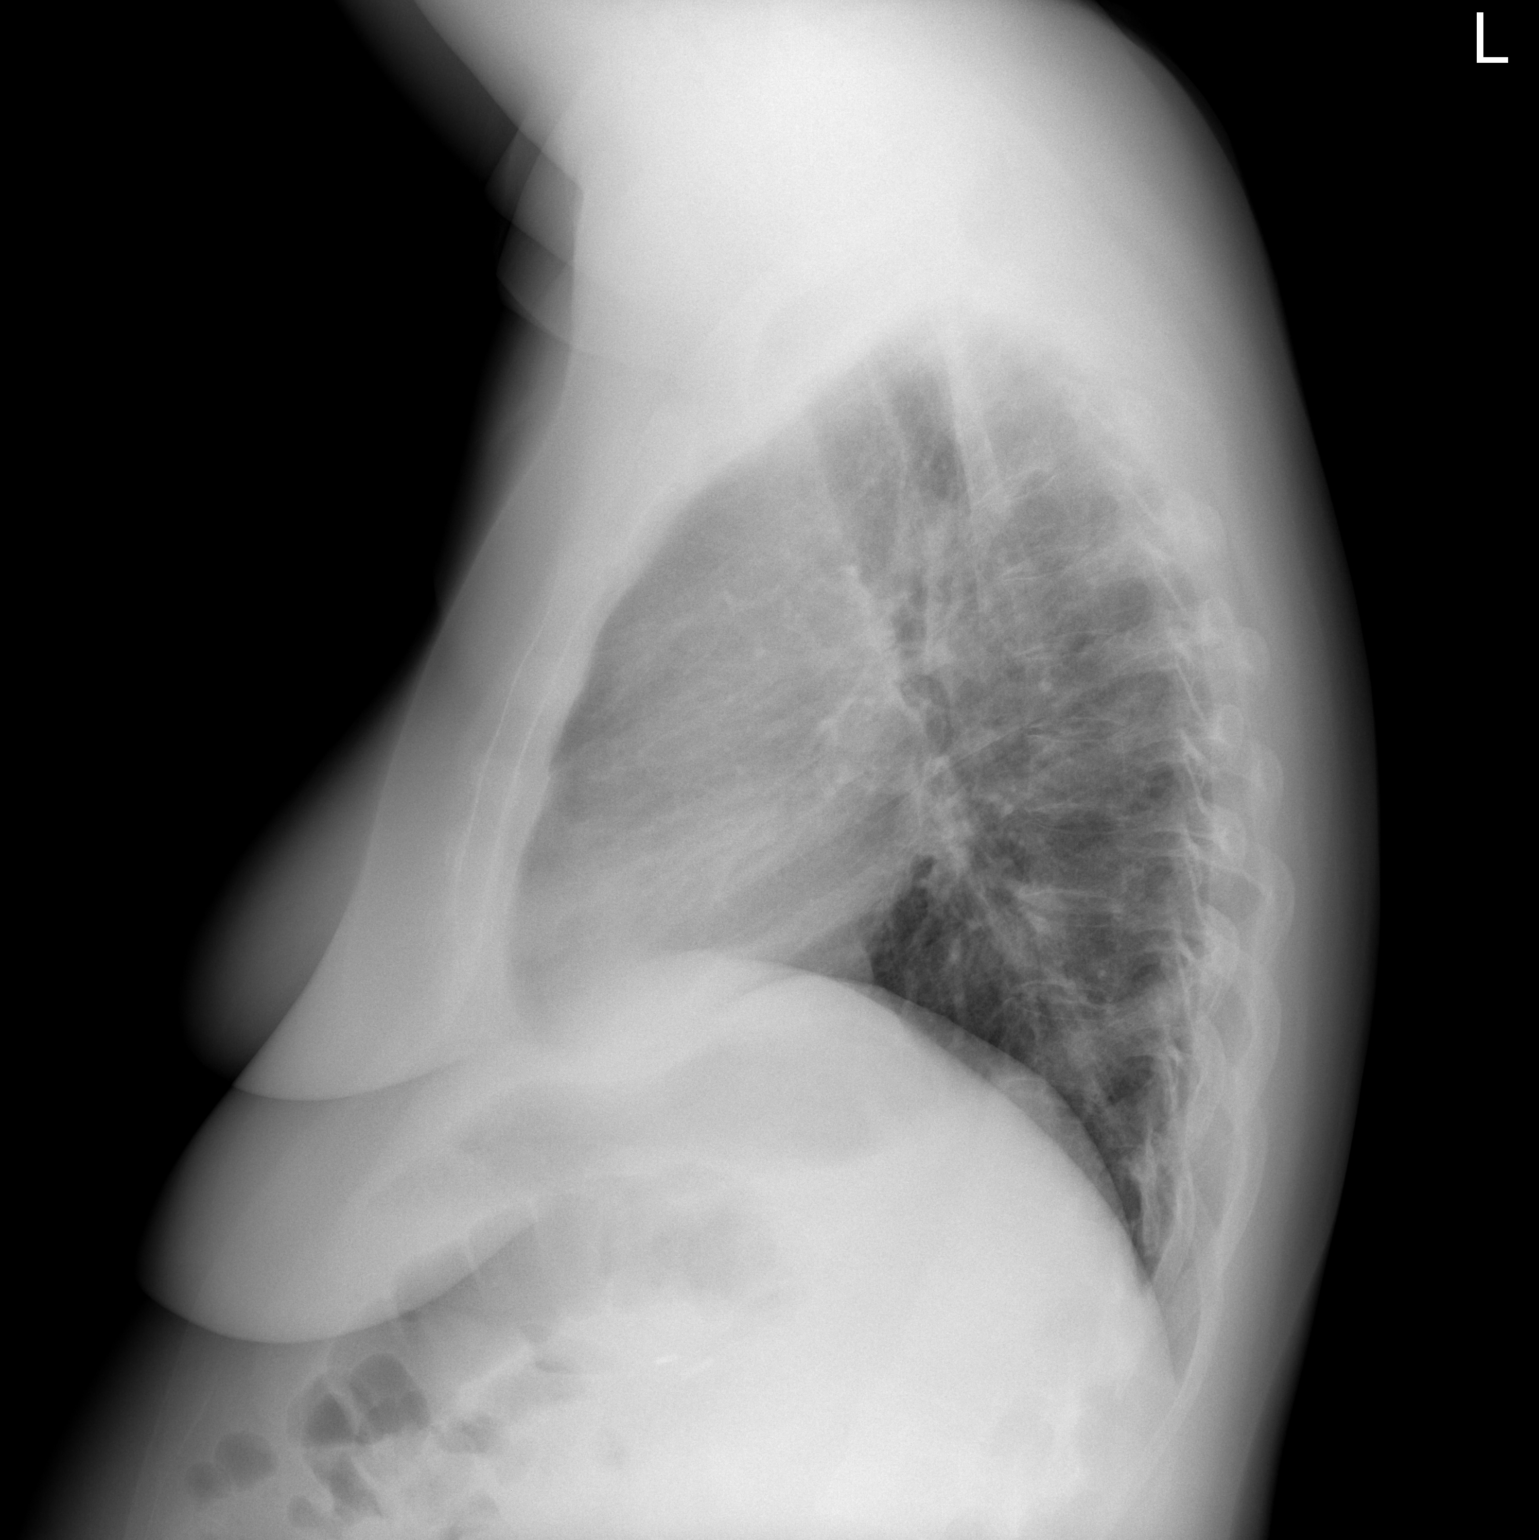

[2 of 2 positions shown; findings below may reference images not displayed]

FINDINGS: There is no edema or consolidation. Heart size and pulmonary
vascularity are normal. No adenopathy. There is an old healed
fracture of the posterior right sixth rib. There are surgical clips
in the right upper quadrant of the abdomen.
IMPRESSION: No edema or consolidation.

## 2017-01-09 ENCOUNTER — Telehealth: Payer: Self-pay | Admitting: Family Medicine

## 2017-01-09 DIAGNOSIS — E876 Hypokalemia: Secondary | ICD-10-CM | POA: Diagnosis not present

## 2017-01-09 DIAGNOSIS — I10 Essential (primary) hypertension: Secondary | ICD-10-CM | POA: Diagnosis not present

## 2017-01-09 LAB — LIPID PANEL
Cholesterol: 189 mg/dL (ref 0–200)
HDL: 39 mg/dL (ref 35–70)
LDL Cholesterol: 128 mg/dL
LDl/HDL Ratio: 4.8
Triglycerides: 112 mg/dL (ref 40–160)

## 2017-01-09 LAB — BASIC METABOLIC PANEL
BUN: 11 mg/dL (ref 4–21)
Creatinine: 0.6 mg/dL (ref 0.5–1.1)
Glucose: 103 mg/dL
Potassium: 4.1 mmol/L (ref 3.4–5.3)
Sodium: 140 mmol/L (ref 137–147)

## 2017-01-09 NOTE — Telephone Encounter (Signed)
Call patient: Electrolytes are normal on recent labs done at [place]. Triglycerides are at goal. Total cholesterol looks good. LDL is just mildly elevated at 128.  Beatrice Lecher, MD

## 2017-01-09 NOTE — Telephone Encounter (Signed)
Pt given results.Jennifer [name]

## 2017-01-10 ENCOUNTER — Encounter: Payer: Self-pay | Admitting: Family Medicine

## 2017-01-11 ENCOUNTER — Ambulatory Visit (INDEPENDENT_AMBULATORY_CARE_PROVIDER_SITE_OTHER): Payer: Medicare Other | Admitting: Family Medicine

## 2017-01-11 ENCOUNTER — Encounter: Payer: Self-pay | Admitting: Family Medicine

## 2017-01-11 VITALS — BP 133/74 | HR 102 | Ht 62.25 in | Wt 209.0 lb

## 2017-01-11 DIAGNOSIS — Q383 Other congenital malformations of tongue: Secondary | ICD-10-CM

## 2017-01-11 DIAGNOSIS — Z6837 Body mass index (BMI) 37.0-37.9, adult: Secondary | ICD-10-CM

## 2017-01-11 DIAGNOSIS — I10 Essential (primary) hypertension: Secondary | ICD-10-CM | POA: Diagnosis not present

## 2017-01-11 DIAGNOSIS — G479 Sleep disorder, unspecified: Secondary | ICD-10-CM

## 2017-01-11 NOTE — Patient Instructions (Signed)
Jennifer [name] is a milk protein free option.
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
## 2017-01-12 DIAGNOSIS — Z882 Allergy status to sulfonamides status: Secondary | ICD-10-CM | POA: Diagnosis not present

## 2017-01-12 DIAGNOSIS — Z8249 Family history of ischemic heart disease and other diseases of the circulatory system: Secondary | ICD-10-CM | POA: Diagnosis not present

## 2017-01-12 DIAGNOSIS — J029 Acute pharyngitis, unspecified: Secondary | ICD-10-CM | POA: Diagnosis not present

## 2017-01-12 DIAGNOSIS — Z885 Allergy status to narcotic agent status: Secondary | ICD-10-CM | POA: Diagnosis not present

## 2017-01-12 DIAGNOSIS — E785 Hyperlipidemia, unspecified: Secondary | ICD-10-CM | POA: Diagnosis not present

## 2017-01-12 DIAGNOSIS — I10 Essential (primary) hypertension: Secondary | ICD-10-CM | POA: Diagnosis not present

## 2017-01-12 DIAGNOSIS — J45909 Unspecified asthma, uncomplicated: Secondary | ICD-10-CM | POA: Diagnosis not present

## 2017-01-12 DIAGNOSIS — Z880 Allergy status to penicillin: Secondary | ICD-10-CM | POA: Diagnosis not present

## 2017-01-12 DIAGNOSIS — Z9289 Personal history of other medical treatment: Secondary | ICD-10-CM | POA: Diagnosis not present

## 2017-01-12 DIAGNOSIS — M797 Fibromyalgia: Secondary | ICD-10-CM | POA: Diagnosis not present

## 2017-01-12 DIAGNOSIS — Z886 Allergy status to analgesic agent status: Secondary | ICD-10-CM | POA: Diagnosis not present

## 2017-01-12 DIAGNOSIS — Z87891 Personal history of nicotine dependence: Secondary | ICD-10-CM | POA: Diagnosis not present

## 2017-01-12 DIAGNOSIS — Z888 Allergy status to other drugs, medicaments and biological substances status: Secondary | ICD-10-CM | POA: Diagnosis not present

## 2017-01-12 DIAGNOSIS — G35 Multiple sclerosis: Secondary | ICD-10-CM | POA: Diagnosis not present

## 2017-01-17 ENCOUNTER — Encounter (HOSPITAL_BASED_OUTPATIENT_CLINIC_OR_DEPARTMENT_OTHER): Payer: Self-pay | Admitting: *Deleted

## 2017-01-17 DIAGNOSIS — K219 Gastro-esophageal reflux disease without esophagitis: Secondary | ICD-10-CM | POA: Diagnosis not present

## 2017-01-17 DIAGNOSIS — J45909 Unspecified asthma, uncomplicated: Secondary | ICD-10-CM | POA: Diagnosis not present

## 2017-01-17 DIAGNOSIS — I10 Essential (primary) hypertension: Secondary | ICD-10-CM | POA: Insufficient documentation

## 2017-01-17 DIAGNOSIS — D649 Anemia, unspecified: Secondary | ICD-10-CM | POA: Insufficient documentation

## 2017-01-17 DIAGNOSIS — Z79899 Other long term (current) drug therapy: Secondary | ICD-10-CM | POA: Insufficient documentation

## 2017-01-17 DIAGNOSIS — Z87891 Personal history of nicotine dependence: Secondary | ICD-10-CM | POA: Insufficient documentation

## 2017-01-17 DIAGNOSIS — R079 Chest pain, unspecified: Secondary | ICD-10-CM | POA: Diagnosis present

## 2017-01-17 DIAGNOSIS — R0600 Dyspnea, unspecified: Secondary | ICD-10-CM | POA: Insufficient documentation

## 2017-01-17 DIAGNOSIS — R1310 Dysphagia, unspecified: Secondary | ICD-10-CM | POA: Diagnosis not present

## 2017-01-17 NOTE — ED Triage Notes (Addendum)
pt c/o left sided chest pain with SOB and "trouble swallowing " after swallow study done today. Pt c/o pro cough

## 2017-01-18 ENCOUNTER — Emergency Department (HOSPITAL_BASED_OUTPATIENT_CLINIC_OR_DEPARTMENT_OTHER): Payer: Medicare Other

## 2017-01-18 ENCOUNTER — Emergency Department (HOSPITAL_BASED_OUTPATIENT_CLINIC_OR_DEPARTMENT_OTHER)
Admission: EM | Admit: 2017-01-18 | Discharge: 2017-01-18 | Disposition: A | Discharge status: Home / Self Care | Payer: Medicare Other | Attending: Emergency Medicine | Admitting: Emergency Medicine

## 2017-01-18 DIAGNOSIS — R079 Chest pain, unspecified: Secondary | ICD-10-CM | POA: Diagnosis not present

## 2017-01-18 DIAGNOSIS — R0689 Other abnormalities of breathing: Secondary | ICD-10-CM

## 2017-01-18 NOTE — ED Notes (Signed)
Pt refused to put on gown  States makes her itch

## 2017-01-18 NOTE — Discharge Instructions (Signed)
Return to the ED with any concerns including difficulty breathing, vomiting and not able to keep down liquids, decreased level of alertness/lethargy, or any other alarming symptoms °
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
## 2017-01-18 NOTE — ED Notes (Signed)
Pt states had food stuck and then started coughing thinks it went down wrong,. abd pain  Coughing up "slime",  Cp, arm pain ,sob many complaints

## 2017-01-25 ENCOUNTER — Encounter: Payer: Self-pay | Admitting: Family Medicine

## 2017-01-25 ENCOUNTER — Ambulatory Visit: Payer: Self-pay | Admitting: Family Medicine

## 2017-01-25 ENCOUNTER — Ambulatory Visit (INDEPENDENT_AMBULATORY_CARE_PROVIDER_SITE_OTHER): Payer: Medicare Other | Admitting: Family Medicine

## 2017-01-25 ENCOUNTER — Other Ambulatory Visit: Payer: Self-pay | Admitting: Family Medicine

## 2017-01-25 VITALS — BP 140/78 | HR 93 | Temp 98.9°F | Ht 62.21 in | Wt 209.0 lb

## 2017-01-25 DIAGNOSIS — I10 Essential (primary) hypertension: Secondary | ICD-10-CM

## 2017-01-25 DIAGNOSIS — R251 Tremor, unspecified: Secondary | ICD-10-CM | POA: Diagnosis not present

## 2017-01-25 DIAGNOSIS — G479 Sleep disorder, unspecified: Secondary | ICD-10-CM | POA: Diagnosis not present

## 2017-01-25 DIAGNOSIS — R7309 Other abnormal glucose: Secondary | ICD-10-CM

## 2017-01-25 DIAGNOSIS — E876 Hypokalemia: Secondary | ICD-10-CM | POA: Diagnosis not present

## 2017-01-25 DIAGNOSIS — Z6837 Body mass index (BMI) 37.0-37.9, adult: Secondary | ICD-10-CM | POA: Diagnosis not present

## 2017-01-25 LAB — BASIC METABOLIC PANEL
BUN: 14 (ref 4–21)
Creatinine: 0.6 (ref 0.5–1.1)
Glucose: 105
Potassium: 4.1 (ref 3.4–5.3)
Sodium: 141 (ref 137–147)

## 2017-01-25 LAB — GLUCOSE, POCT (MANUAL RESULT ENTRY): POC Glucose: 168 mg/dl — AB (ref 70–99)
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
## 2017-01-26 ENCOUNTER — Telehealth: Payer: Self-pay | Admitting: Family Medicine

## 2017-01-26 DIAGNOSIS — G35 Multiple sclerosis: Secondary | ICD-10-CM | POA: Diagnosis not present

## 2017-01-26 DIAGNOSIS — J45909 Unspecified asthma, uncomplicated: Secondary | ICD-10-CM | POA: Diagnosis not present

## 2017-01-26 DIAGNOSIS — Z87891 Personal history of nicotine dependence: Secondary | ICD-10-CM | POA: Diagnosis not present

## 2017-01-26 DIAGNOSIS — I10 Essential (primary) hypertension: Secondary | ICD-10-CM | POA: Diagnosis not present

## 2017-01-26 DIAGNOSIS — M79602 Pain in left arm: Secondary | ICD-10-CM | POA: Diagnosis not present

## 2017-01-26 DIAGNOSIS — M542 Cervicalgia: Secondary | ICD-10-CM | POA: Diagnosis not present

## 2017-01-26 DIAGNOSIS — Z8249 Family history of ischemic heart disease and other diseases of the circulatory system: Secondary | ICD-10-CM | POA: Diagnosis not present

## 2017-01-26 DIAGNOSIS — Z9289 Personal history of other medical treatment: Secondary | ICD-10-CM | POA: Diagnosis not present

## 2017-01-26 DIAGNOSIS — M25512 Pain in left shoulder: Secondary | ICD-10-CM | POA: Diagnosis not present

## 2017-01-26 DIAGNOSIS — R0789 Other chest pain: Secondary | ICD-10-CM | POA: Diagnosis not present

## 2017-01-26 NOTE — Telephone Encounter (Signed)
Call patient: Electrolytes are normal. Kidney function is stable on labs done at [facility]

## 2017-02-02 DIAGNOSIS — Z79899 Other long term (current) drug therapy: Secondary | ICD-10-CM | POA: Diagnosis not present

## 2017-02-02 DIAGNOSIS — I493 Ventricular premature depolarization: Secondary | ICD-10-CM | POA: Diagnosis not present

## 2017-02-02 DIAGNOSIS — R0602 Shortness of breath: Secondary | ICD-10-CM | POA: Diagnosis not present

## 2017-02-02 DIAGNOSIS — I471 Supraventricular tachycardia: Secondary | ICD-10-CM | POA: Diagnosis not present

## 2017-02-02 DIAGNOSIS — R0789 Other chest pain: Secondary | ICD-10-CM | POA: Diagnosis not present

## 2017-02-02 DIAGNOSIS — J028 Acute pharyngitis due to other specified organisms: Secondary | ICD-10-CM | POA: Diagnosis not present

## 2017-02-02 DIAGNOSIS — R000 Tachycardia, unspecified: Secondary | ICD-10-CM | POA: Diagnosis not present

## 2017-02-02 DIAGNOSIS — R05 Cough: Secondary | ICD-10-CM | POA: Diagnosis not present

## 2017-02-02 DIAGNOSIS — Z87891 Personal history of nicotine dependence: Secondary | ICD-10-CM | POA: Diagnosis not present

## 2017-02-02 DIAGNOSIS — R5383 Other fatigue: Secondary | ICD-10-CM | POA: Diagnosis not present

## 2017-02-02 DIAGNOSIS — J029 Acute pharyngitis, unspecified: Secondary | ICD-10-CM | POA: Diagnosis not present

## 2017-02-02 DIAGNOSIS — I10 Essential (primary) hypertension: Secondary | ICD-10-CM | POA: Diagnosis not present

## 2017-02-03 DIAGNOSIS — Z87891 Personal history of nicotine dependence: Secondary | ICD-10-CM | POA: Diagnosis not present

## 2017-02-03 DIAGNOSIS — R0602 Shortness of breath: Secondary | ICD-10-CM | POA: Diagnosis not present

## 2017-02-03 DIAGNOSIS — Z883 Allergy status to other anti-infective agents status: Secondary | ICD-10-CM | POA: Diagnosis not present

## 2017-02-03 DIAGNOSIS — Z882 Allergy status to sulfonamides status: Secondary | ICD-10-CM | POA: Diagnosis not present

## 2017-02-03 DIAGNOSIS — Z91018 Allergy to other foods: Secondary | ICD-10-CM | POA: Diagnosis not present

## 2017-02-03 DIAGNOSIS — J45909 Unspecified asthma, uncomplicated: Secondary | ICD-10-CM | POA: Diagnosis not present

## 2017-02-03 DIAGNOSIS — Z79899 Other long term (current) drug therapy: Secondary | ICD-10-CM | POA: Diagnosis not present

## 2017-02-03 DIAGNOSIS — R42 Dizziness and giddiness: Secondary | ICD-10-CM | POA: Diagnosis not present

## 2017-02-03 DIAGNOSIS — R05 Cough: Secondary | ICD-10-CM | POA: Diagnosis not present

## 2017-02-03 DIAGNOSIS — Z881 Allergy status to other antibiotic agents status: Secondary | ICD-10-CM | POA: Diagnosis not present

## 2017-02-03 DIAGNOSIS — Z7951 Long term (current) use of inhaled steroids: Secondary | ICD-10-CM | POA: Diagnosis not present

## 2017-02-03 DIAGNOSIS — Z880 Allergy status to penicillin: Secondary | ICD-10-CM | POA: Diagnosis not present

## 2017-02-03 DIAGNOSIS — Z885 Allergy status to narcotic agent status: Secondary | ICD-10-CM | POA: Diagnosis not present

## 2017-02-03 DIAGNOSIS — R5383 Other fatigue: Secondary | ICD-10-CM | POA: Diagnosis not present

## 2017-02-03 DIAGNOSIS — K219 Gastro-esophageal reflux disease without esophagitis: Secondary | ICD-10-CM | POA: Diagnosis not present

## 2017-02-03 DIAGNOSIS — Z888 Allergy status to other drugs, medicaments and biological substances status: Secondary | ICD-10-CM | POA: Diagnosis not present

## 2017-02-05 ENCOUNTER — Telehealth: Payer: Self-pay

## 2017-02-05 NOTE — Telephone Encounter (Signed)
Jennifer [name] was seen over the weekend for shortness of breath and elevated blood pressure. She states she has talked to the pharmacy and they advised that the Labetalol is causing her breathing issues. She would like to switch back to Metoprolol 50 mg BID. She has been scheduled for a follow up tomorrow. Please advise.

## 2017-02-06 ENCOUNTER — Ambulatory Visit (INDEPENDENT_AMBULATORY_CARE_PROVIDER_SITE_OTHER): Payer: Medicare Other | Admitting: Family Medicine

## 2017-02-06 ENCOUNTER — Encounter: Payer: Self-pay | Admitting: Family Medicine

## 2017-02-06 ENCOUNTER — Telehealth: Payer: Self-pay

## 2017-02-06 VITALS — BP 143/81 | HR 110 | Ht 62.0 in | Wt 211.0 lb

## 2017-02-06 DIAGNOSIS — I10 Essential (primary) hypertension: Secondary | ICD-10-CM

## 2017-02-06 DIAGNOSIS — K143 Hypertrophy of tongue papillae: Secondary | ICD-10-CM | POA: Diagnosis not present

## 2017-02-06 DIAGNOSIS — R0602 Shortness of breath: Secondary | ICD-10-CM

## 2017-02-06 MED ORDER — METOPROLOL TARTRATE 50 MG PO TABS: 50.0000 mg | ORAL_TABLET | Freq: Two times a day (BID) | ORAL | 0 refills | Status: DC

## 2017-02-06 NOTE — Progress Notes (Signed)
Subjective:    CC: SOB    HPI:  51 year old female comes in today to follow-up on recent shortness of breath. She recently went to the emergency department on June 22 and June 23. She was losing her voice and getting raspy. She felt like she was more short of breath. She really feels like the labetalol is affecting her breathing in addition to the recent heat. The temperature has been in the 90s here locally over the last couple of weeks. They did do a cardiac workup which is essentially negative. Normal troponins. She would like to try metoprolol again instead of the labetalol. She currently has a heart monitor on which she placed on about 3 days ago and is undergoing a full workup with cardiology. She says that she feels the shortness of breath is worse when she gets hot and worse about an hour after she takes her medication and after eating. Her blood pressure was quite elevated in the emergency department. At one point actually did orthostatics and her blood pressure was 238/116. She's not been able to exercise and has been laying in the bed this past week because of the shortness of breath.  Hypertension-she would like to switch off the labetalol and retry metoprolol which she has tried a couple times in the past.  Also wants me to check her tongue today. She feels like it's feeling a little sick and has little bit more for white coating on it. In. She just wants to make sure she's not getting thrush again.  Past medical history, Surgical history, Family history not pertinant except as noted below, Social history, Allergies, and medications have been entered into the medical record, reviewed, and corrections made.   Review of Systems: No fevers, chills, night sweats, weight loss.  + cough  Objective:    General: Well Developed, well nourished, and in no acute distress.  Neuro: Alert and oriented x3, extra-ocular muscles intact, sensation grossly intact.  HEENT: Normocephalic, atraumatic,  Oropharynx is clear. Tongue does have a little bit of white discoloration but no erythema around the edges to suggest thrush. There is some ridging along the side of the tongue where there are indentations from her teeth.  Skin: Warm and dry, no rashes. Cardiac: Regular rate and rhythm, no murmurs rubs or gallops, no lower extremity edema.  Respiratory: Clear to auscultation bilaterally. Not using accessory muscles, speaking in full sentences.   Impression and Recommendations:   Shortness of breath-unclear etiology. She's had a negative workup at the emergency department. Negative d-dimer and negative troponins. She's had recurrent issues with chest pain and shortness of breath. With multiple negative workups. She is currently undergoing a new cardiology workup. At this point I think it's reasonable to try switching her labetalol to metoprolol and see if she feels better. I don't think switching the beta blockers will have a big impact on her cardiac monitor. She never did try the valsartan. She did pick up the prescription but didn't try it. Did encourage her to make sure she is hydrating well. Did reassure her that her pulse ox is normal today.  Hypertension-see note above.  Tongue coating -no evidence of thrush at this time but she will be coming back in later this weeks if she feels like it's getting worse then I'll be happy to take a look again. She does have some bridging on the side of her tongue.

## 2017-02-06 NOTE — Telephone Encounter (Signed)
I will address that office visit today.

## 2017-02-06 NOTE — Telephone Encounter (Signed)
Pt called to cancel her appointment tomorrow with Dr. Alycia Rossetti as she does not have transportation.  She will call back to reschedule when she has transportation.

## 2017-02-07 ENCOUNTER — Ambulatory Visit: Payer: Self-pay | Admitting: Gynecologic Oncology

## 2017-02-07 ENCOUNTER — Encounter: Payer: Self-pay | Admitting: Family Medicine

## 2017-02-07 LAB — CALCIUM: Calcium: 8.9

## 2017-02-08 ENCOUNTER — Ambulatory Visit (INDEPENDENT_AMBULATORY_CARE_PROVIDER_SITE_OTHER): Payer: Medicare Other | Admitting: Family Medicine

## 2017-02-08 ENCOUNTER — Encounter: Payer: Self-pay | Admitting: Family Medicine

## 2017-02-08 VITALS — BP 133/83 | HR 101 | Ht 62.21 in | Wt 212.0 lb

## 2017-02-08 DIAGNOSIS — I10 Essential (primary) hypertension: Secondary | ICD-10-CM

## 2017-02-08 DIAGNOSIS — M25511 Pain in right shoulder: Secondary | ICD-10-CM

## 2017-02-08 DIAGNOSIS — R42 Dizziness and giddiness: Secondary | ICD-10-CM

## 2017-02-08 DIAGNOSIS — M25512 Pain in left shoulder: Secondary | ICD-10-CM

## 2017-02-08 DIAGNOSIS — L309 Dermatitis, unspecified: Secondary | ICD-10-CM

## 2017-02-08 DIAGNOSIS — K2100 Gastro-esophageal reflux disease with esophagitis, without bleeding: Secondary | ICD-10-CM

## 2017-02-08 DIAGNOSIS — R0683 Snoring: Secondary | ICD-10-CM

## 2017-02-08 DIAGNOSIS — K140 Glossitis: Secondary | ICD-10-CM
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
## 2017-02-08 NOTE — Patient Instructions (Signed)
Can try Aquaphor and Eucerin for her feet

## 2017-02-09 DIAGNOSIS — R1310 Dysphagia, unspecified: Secondary | ICD-10-CM | POA: Diagnosis not present

## 2017-02-09 DIAGNOSIS — R0602 Shortness of breath: Secondary | ICD-10-CM | POA: Diagnosis not present

## 2017-02-09 DIAGNOSIS — Z87891 Personal history of nicotine dependence: Secondary | ICD-10-CM | POA: Diagnosis not present

## 2017-02-09 DIAGNOSIS — G35 Multiple sclerosis: Secondary | ICD-10-CM | POA: Diagnosis not present

## 2017-02-09 DIAGNOSIS — Z886 Allergy status to analgesic agent status: Secondary | ICD-10-CM | POA: Diagnosis not present

## 2017-02-09 DIAGNOSIS — I10 Essential (primary) hypertension: Secondary | ICD-10-CM | POA: Diagnosis not present

## 2017-02-09 DIAGNOSIS — Z885 Allergy status to narcotic agent status: Secondary | ICD-10-CM | POA: Diagnosis not present

## 2017-02-09 DIAGNOSIS — Z882 Allergy status to sulfonamides status: Secondary | ICD-10-CM | POA: Diagnosis not present

## 2017-02-09 DIAGNOSIS — Z880 Allergy status to penicillin: Secondary | ICD-10-CM | POA: Diagnosis not present

## 2017-02-09 DIAGNOSIS — Z888 Allergy status to other drugs, medicaments and biological substances status: Secondary | ICD-10-CM | POA: Diagnosis not present

## 2017-02-09 DIAGNOSIS — E785 Hyperlipidemia, unspecified: Secondary | ICD-10-CM | POA: Diagnosis not present

## 2017-02-09 DIAGNOSIS — J45909 Unspecified asthma, uncomplicated: Secondary | ICD-10-CM | POA: Diagnosis not present

## 2017-02-09 DIAGNOSIS — Z9289 Personal history of other medical treatment: Secondary | ICD-10-CM | POA: Diagnosis not present

## 2017-02-09 DIAGNOSIS — R5381 Other malaise: Secondary | ICD-10-CM | POA: Diagnosis not present

## 2017-02-09 DIAGNOSIS — R0789 Other chest pain: Secondary | ICD-10-CM | POA: Diagnosis not present

## 2017-02-09 DIAGNOSIS — M797 Fibromyalgia: Secondary | ICD-10-CM | POA: Diagnosis not present

## 2017-02-09 DIAGNOSIS — R5383 Other fatigue: Secondary | ICD-10-CM | POA: Diagnosis not present

## 2017-02-09 NOTE — Addendum Note (Signed)
Addended by: Beatrice Lecher D on: 02/09/2017 02:47 PM   Modules accepted: Orders, SmartSet

## 2017-02-15 ENCOUNTER — Emergency Department (HOSPITAL_BASED_OUTPATIENT_CLINIC_OR_DEPARTMENT_OTHER)
Admission: EM | Admit: 2017-02-15 | Discharge: 2017-02-16 | Disposition: A | Discharge status: Home / Self Care | Payer: Medicare Other | Attending: Emergency Medicine | Admitting: Emergency Medicine

## 2017-02-15 ENCOUNTER — Encounter (HOSPITAL_BASED_OUTPATIENT_CLINIC_OR_DEPARTMENT_OTHER): Payer: Self-pay | Admitting: Emergency Medicine

## 2017-02-15 DIAGNOSIS — Z87891 Personal history of nicotine dependence: Secondary | ICD-10-CM | POA: Diagnosis not present

## 2017-02-15 DIAGNOSIS — I10 Essential (primary) hypertension: Secondary | ICD-10-CM | POA: Insufficient documentation

## 2017-02-15 DIAGNOSIS — R0602 Shortness of breath: Secondary | ICD-10-CM | POA: Insufficient documentation

## 2017-02-15 DIAGNOSIS — Z91010 Allergy to peanuts: Secondary | ICD-10-CM | POA: Insufficient documentation

## 2017-02-15 DIAGNOSIS — J45909 Unspecified asthma, uncomplicated: Secondary | ICD-10-CM | POA: Insufficient documentation

## 2017-02-15 DIAGNOSIS — Z79899 Other long term (current) drug therapy: Secondary | ICD-10-CM | POA: Insufficient documentation
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
## 2017-02-15 NOTE — ED Triage Notes (Signed)
SOB x 2-3 days and pain in abdomen.

## 2017-02-15 NOTE — Telephone Encounter (Signed)
Pt notified of results -EH/RMA   

## 2017-02-16 ENCOUNTER — Telehealth: Payer: Self-pay | Admitting: *Deleted

## 2017-02-16 NOTE — Telephone Encounter (Signed)
Pt wanted to know if it would be ok for her to take a 2 mg dexmethasone until she is seen on Monday she reports that she feels "swollen in her throat" will fwd to pcp for advice.Elouise Munroe'

## 2017-02-16 NOTE — Telephone Encounter (Signed)
Pt called and stated that her head feels whoozy. She feels that her throat is clamping down on her and she can hardly breath.   I asked her if she had tried cooled compresses. She stated that she has AC in her car now. I told her that it's not about having AC.   She reports that her stomach feels distended. Asked her when her last BM was she said she had 2 earlier. She is not cramping just discomfort and some cramping. She feels like she is clamping down in her throat. She reports that she was walking when this started and her head started feeling whoozy she came home and laid down and took her bp it was 154/92 the 1st time the 2nd time which was about 3 mins later 143/92. I told her that she should not be checking her bp's so close together. She said that she knows.   I advised her to drinks some hot tea with fresh lemon and honey, lay down, elevate her feet, get a cold compress and place it over her forehead and try to just relax. Also told her that it would be ok for her to take the 2 mg dexamtheasone over the weekend.Maryruth Eve, Lahoma Crocker

## 2017-02-17 DIAGNOSIS — R221 Localized swelling, mass and lump, neck: Secondary | ICD-10-CM | POA: Diagnosis not present

## 2017-02-17 DIAGNOSIS — R05 Cough: Secondary | ICD-10-CM | POA: Diagnosis not present

## 2017-02-17 DIAGNOSIS — R070 Pain in throat: Secondary | ICD-10-CM | POA: Diagnosis not present

## 2017-02-17 DIAGNOSIS — R140 Abdominal distension (gaseous): Secondary | ICD-10-CM | POA: Diagnosis not present

## 2017-02-17 DIAGNOSIS — R109 Unspecified abdominal pain: Secondary | ICD-10-CM | POA: Diagnosis not present

## 2017-02-17 DIAGNOSIS — R1310 Dysphagia, unspecified: Secondary | ICD-10-CM | POA: Diagnosis not present

## 2017-02-17 DIAGNOSIS — R0602 Shortness of breath: Secondary | ICD-10-CM | POA: Diagnosis not present

## 2017-02-18 DIAGNOSIS — R0602 Shortness of breath: Secondary | ICD-10-CM | POA: Diagnosis not present

## 2017-02-19 ENCOUNTER — Ambulatory Visit (INDEPENDENT_AMBULATORY_CARE_PROVIDER_SITE_OTHER): Payer: Medicare Other | Admitting: Family Medicine

## 2017-02-19 VITALS — BP 138/87 | HR 86 | Temp 98.9°F | Ht 62.0 in | Wt 213.0 lb

## 2017-02-19 DIAGNOSIS — R198 Other specified symptoms and signs involving the digestive system and abdomen: Secondary | ICD-10-CM

## 2017-02-19 DIAGNOSIS — F458 Other somatoform disorders: Secondary | ICD-10-CM

## 2017-02-19 DIAGNOSIS — R42 Dizziness and giddiness: Secondary | ICD-10-CM | POA: Diagnosis not present

## 2017-02-19 DIAGNOSIS — I10 Essential (primary) hypertension: Secondary | ICD-10-CM

## 2017-02-19 DIAGNOSIS — R0989 Other specified symptoms and signs involving the circulatory and respiratory systems: Secondary | ICD-10-CM

## 2017-02-19 DIAGNOSIS — R141 Gas pain: Secondary | ICD-10-CM | POA: Diagnosis not present

## 2017-02-19 DIAGNOSIS — R059 Cough, unspecified: Secondary | ICD-10-CM

## 2017-02-19 DIAGNOSIS — R05 Cough: Secondary | ICD-10-CM | POA: Diagnosis not present

## 2017-02-19 NOTE — Patient Instructions (Signed)
Take your steroid.
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
## 2017-02-20 ENCOUNTER — Ambulatory Visit (INDEPENDENT_AMBULATORY_CARE_PROVIDER_SITE_OTHER): Payer: Medicare Other | Admitting: Pediatrics

## 2017-02-20 ENCOUNTER — Encounter: Payer: Self-pay | Admitting: Pediatrics

## 2017-02-20 VITALS — BP 138/90 | HR 100 | Temp 98.1°F | Resp 18 | Ht 62.21 in | Wt 213.8 lb

## 2017-02-20 DIAGNOSIS — Z9989 Dependence on other enabling machines and devices: Secondary | ICD-10-CM | POA: Diagnosis not present

## 2017-02-20 DIAGNOSIS — Z79899 Other long term (current) drug therapy: Secondary | ICD-10-CM

## 2017-02-20 DIAGNOSIS — G35 Multiple sclerosis: Secondary | ICD-10-CM

## 2017-02-20 DIAGNOSIS — I10 Essential (primary) hypertension: Secondary | ICD-10-CM | POA: Diagnosis not present

## 2017-02-20 DIAGNOSIS — G4733 Obstructive sleep apnea (adult) (pediatric): Secondary | ICD-10-CM

## 2017-02-20 DIAGNOSIS — K219 Gastro-esophageal reflux disease without esophagitis: Secondary | ICD-10-CM

## 2017-02-20 DIAGNOSIS — J4520 Mild intermittent asthma, uncomplicated: Secondary | ICD-10-CM | POA: Diagnosis not present

## 2017-02-20 NOTE — Progress Notes (Signed)
[place] [id number] Dept: [phone]  FOLLOW UP NOTE  Patient ID: Jennifer [name], female    DOB: 1966-07-19  Age: 51 y.o. MRN: [id number] Date of Office Visit: 02/20/2017  Assessment  Chief Complaint: Cough (upper chest feels tight and having trouble breathing. sx x 2 weeks.  Hot weather makes sx worse.)  HPI Jennifer [name] presents for for evaluation of a cough for about 2 weeks. The coughing is in her upper chest. When she goes outside in the heat the cough gets worse. We suspect that she has vocal cord dysfunction but she has not had a methacholine challenge.. She has not tolerated inhaled  steroids in the past. She has Xopenex to use 2 puffs every 6 hours if needed but it  does not seem to help her greatly. If she takes prednisone , her blood sugars increase , but she can tolerate Decadron 4 mg once a day for a few days  days. She has not had episodes of swelling of her tongue. She can tolerate metoprolol without any breathing problems.  Current medications are Xopenex 2 puffs every 6 hours if needed. Her other medications are outlined in the chart   Drug Allergies:  Allergies  Allergen Reactions  . Aspirin Other (See Comments) and Hives    flushing flushing  . Azithromycin Other (See Comments), Itching and Shortness Of Breath    Lip swelling, SOB.  Lip swelling, SOB.   . Codeine Shortness Of Breath  . Coreg [Carvedilol] Shortness Of Breath    CP  . Moxifloxacin Itching, Other (See Comments), Rash and Shortness Of Breath    Shortness of breath  . Mushroom Extract Complex Anaphylaxis  . Nitrofurantoin Shortness Of Breath    Patient said unaware of this allergen REACTION: sweats REACTION: sweats  . Peanuts [Peanut Oil] Anaphylaxis    Other reaction(s): Other (See Comments) Per allergist,do not take  . Promethazine Hcl Anaphylaxis    jittery  . Quinolones Rash and Swelling  . Telmisartan Swelling    Tongue swelling  . Tobramycin Itching   itching , rash  . Beta Adrenergic Blockers Other (See Comments)    Feels like chest tightening "Metoprolol" Feels like chest tighting Feels like chest tighting "Metoprolol"  . Cetirizine Other (See Comments) and Rash    Broke out in hives the day before, took Zyrtec &  wasn't sure if this made it worse All over body Broke out in hives the day before, took Zyrtec &  wasn't sure if this made it worse  . Erythromycin Rash  . Penicillins Rash  . Pravastatin Other (See Comments)    Myalgias Myalgias  . Serotonin Reuptake Inhibitors (Ssris) Other (See Comments)    Headache  . Ace Inhibitors Swelling  . Atenolol Other (See Comments)    Squeezing chest sensation Squeezing chest sensation  . Avelox [Moxifloxacin Hcl In Nacl] Itching and Other (See Comments)    Shortness of breath  . Butorphanol Tartrate Other (See Comments)    Patient aggitated Patient aggitated REACTION: unknown Patient aggitated  . Ciprofloxacin     REACTION: tongue swells  . Clonidine Hcl     REACTION: makes blood pressure high  . Cortisone   . Doxycycline Other (See Comments)  . Fentanyl     High [facility] record   . Fluoxetine Hcl Other (See Comments)    REACTION: headaches  . Ketorolac Tromethamine     jittery  . Labetalol Other (See Comments)    "  Really feeling bad"  . Lac Bovis Other (See Comments)    Other reaction(s): Angioedema (ALLERGY/intolerance) 05/02/2013 Ige Results show mild IgE of 0.11 Other reaction(s): Angioedema (ALLERGY/intolerance) 05/02/2013 Ige Results show mild IgE of 0.11  . Lactalbumin   . Lidocaine Other (See Comments)    "It messes me up".  "I can't take it."  . Lisinopril Cough    Other reaction(s): Cough REACTION: cough REACTION: cough  . Metoclopramide Hcl Other (See Comments)    Has a twitchy feeling  . Metoprolol     Other reaction(s): OTHER  . Milk-Related Compounds   . Montelukast Other (See Comments)    Don't remember Don't remember  . Naproxen  Other (See Comments)    FLUSHING  . Other Other (See Comments)    Other reaction(s): Other (See Comments) Uncoded Allergy. Allergen: IRON IV, Other Reaction: Not Assessed Other reaction(s): Other (See Comments) Uncoded Allergy. Allergen: steriods, Other Reaction: Not Assessed Uncoded Allergy. Allergen: IRON IV, Other Reaction: Not Assessed Uncoded Allergy. Allergen: steriods, Other Reaction: Not Assessed Other reaction(s): Flushing (ALLERGY/intolerance), GI Upset (intolerance), Hypertension (intolerance), Increased Heart Rate (intolerance), Mental Status Changes (intolerance), Other (See Comments), Tachycardia / Palpitations(intolerance) Hospital gowns leave a rash. Anything sticky leaves a rash. Heart monitor tapes cause a very bad rash. Antiemetics makes jittery. Anti-nausea medication causes unknown reaction--PT can only take Zofran. All pain medication has unknown reaction. Antibiotics cause unknown reaction--except Levaquin. Steroids cause hives and redness.  . Paroxetine Other (See Comments)    Other reaction(s): Other (See Comments) REACTION: headaches REACTION: headaches  . Promethazine Other (See Comments)    I can't sit still I can't sit still I can't sit still  . Sertraline Hcl     REACTION: headaches  . Sertraline Hcl     REACTION: headaches  . Spironolactone   . Stelazine [Trifluoperazine]   . Sulfamethoxazole     Other reaction(s): Other (See Comments) Not sure about reaction; was a long time ago  . Trifluoperazine Hcl     REACTION: unknown  . Trifluoperazine Hcl     REACTION: unknown  . Vancomycin Other (See Comments)     Unknown reaction to all mycins Other reaction(s): Other (See Comments), Unknown Other Reaction: all mycins  . Versed [Midazolam]     [facility] medical record [facility] medical record  . Whey   . Adhesive [Tape] Rash    EKG monitor patches, some tapes"reddnes,blisters"  . Butorphanol Anxiety  . Ceftriaxone Rash    . Cyprodenate Itching  . Erythromycin Base Itching and Rash  . Iron Rash    I am anemic but there are certain irons that I break out in a rash I am anemic but there are certain irons that I break out in a rash  . Metoclopramide Itching and Other (See Comments)    Other reaction(s): Other (See Comments) Makes me talk funny Other reaction(s): Agitation Has a twitchy feeling  . Metronidazole Rash  . Prednisone Anxiety and Palpitations  . Prochlorperazine Anxiety  . Prochlorperazine Edisylate Anxiety  . Sulfa Antibiotics Other (See Comments) and Rash    Other reaction(s): SHORTNESS OF BREATH  . Venlafaxine Anxiety  . Zyrtec [Cetirizine Hcl] Rash    All over body    Physical Exam: BP 138/90 (BP Location: Right Arm, Patient Position: Sitting, Cuff Size: Normal)   Pulse 100   Temp 98.1 F (36.7 C) (Oral)   Resp 18   Ht 5' 2.21" (1.58 m)   Wt 213 lb 12.8  oz (97 kg)   LMP 06/25/2013   SpO2 98%   BMI 38.85 kg/m    Physical Exam  Constitutional: She is oriented to person, place, and time. She appears well-developed and well-nourished.  HENT:  Eyes normal. Ears normal. Nose normal. Pharynx normal.  Neck: Neck supple.  Cardiovascular:  S1 and S2 normal no murmurs  Pulmonary/Chest:  Clear to percussion and auscultation  Lymphadenopathy:    She has no cervical adenopathy.  Neurological: She is alert and oriented to person, place, and time.  Psychiatric: She has a normal mood and affect. Her behavior is normal. Judgment and thought content normal.  Vitals reviewed.   Diagnostics:  FVC 2.75 L FEV1 2.53 L. Predicted FVC 3.26 L predicted FEV1 2.58 L. After Xopenex 1.25 mg by nebulization ,  FVC 2.62 L FEV1 2.37 L-the spirometry is in the normal range and there was no significant improvement after Xopenex  Assessment and Plan: 1. Mild intermittent asthma without complication   2. Multiple sclerosis ([place])   3. Gastroesophageal reflux disease without esophagitis   4. Essential  hypertension   5. Current use of beta blocker   6. Obstructive sleep apnea treated with continuous positive airway pressure (CPAP)        Patient Instructions  Xopenex 2 puffs every 6 hours if needed for cough or wheeze Scheduled a methacholine challenge. Her breathing function did not improve with the Xopenex. We suspect vocal cord dysfunction Decadron 4 mg once a day for 5 days Call us if she is not doing well on this treatment plan Continue on her other medications  If she has an allergic reaction,  take Benadryl 50 mg every 4 hours and if she has life-threatening symptoms inject  with EpiPen 0.3 mg. Then write what she had to eat or drink in the previous 4 hours   Return in about 6 months (around 08/23/2017).    Thank you for the opportunity to care for this patient.  Please do not hesitate to contact me with questions.  Penne Lash, M.D.  [facility] [address] [place], [place] [address] [phone]

## 2017-02-21 ENCOUNTER — Ambulatory Visit: Payer: Medicare Other | Admitting: Allergy and Immunology

## 2017-02-21 ENCOUNTER — Telehealth: Payer: Self-pay | Admitting: *Deleted

## 2017-02-21 DIAGNOSIS — E876 Hypokalemia: Secondary | ICD-10-CM | POA: Diagnosis not present

## 2017-02-21 DIAGNOSIS — R490 Dysphonia: Secondary | ICD-10-CM | POA: Diagnosis not present

## 2017-02-21 LAB — BASIC METABOLIC PANEL
BUN: 18 (ref 4–21)
Creatinine: 0.6 (ref 0.5–1.1)
Glucose: 84
Potassium: 3.9 (ref 3.4–5.3)
Sodium: 144 (ref 137–147)

## 2017-02-21 NOTE — Patient Instructions (Addendum)
Xopenex 2 puffs every 6 hours if needed for cough or wheeze Scheduled a methacholine challenge. Her breathing function did not improve with the Xopenex. We suspect vocal cord dysfunction Decadron 4 mg once a day for 5 days Call us if she is not doing well on this treatment plan Continue on her other medications  If she has an allergic reaction,  take Benadryl 50 mg every 4 hours and if she has life-threatening symptoms inject  with EpiPen 0.3 mg. Then write what she had to eat or drink in the previous 4 hours

## 2017-02-21 NOTE — Telephone Encounter (Signed)
Called pt back lvm advising her to return call in the morning.Jennifer [name] [place]

## 2017-02-21 NOTE — Telephone Encounter (Signed)
Pt lvm asking for a return call. She did not give any detailed information for the call.Jennifer [name] [place]

## 2017-02-22 ENCOUNTER — Telehealth: Payer: Self-pay | Admitting: *Deleted

## 2017-02-22 ENCOUNTER — Telehealth: Payer: Self-pay | Admitting: Family Medicine

## 2017-02-22 ENCOUNTER — Ambulatory Visit: Payer: Self-pay | Admitting: Family Medicine

## 2017-02-22 DIAGNOSIS — I517 Cardiomegaly: Secondary | ICD-10-CM | POA: Diagnosis not present

## 2017-02-22 DIAGNOSIS — I519 Heart disease, unspecified: Secondary | ICD-10-CM | POA: Diagnosis not present

## 2017-02-22 DIAGNOSIS — I340 Nonrheumatic mitral (valve) insufficiency: Secondary | ICD-10-CM | POA: Diagnosis not present

## 2017-02-22 NOTE — Telephone Encounter (Signed)
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

## 2017-02-22 NOTE — Telephone Encounter (Signed)
Call pt: BMP from 02/21/17 if normal.

## 2017-02-23 NOTE — Telephone Encounter (Signed)
Normal results left on vm -EH/RMA  

## 2017-02-23 NOTE — Telephone Encounter (Signed)
Pt came by and p/u what was needed.Jennifer [name] [place]

## 2017-02-24 ENCOUNTER — Encounter: Payer: Self-pay | Admitting: Family Medicine

## 2017-02-24 DIAGNOSIS — Z888 Allergy status to other drugs, medicaments and biological substances status: Secondary | ICD-10-CM | POA: Diagnosis not present

## 2017-02-24 DIAGNOSIS — Z79899 Other long term (current) drug therapy: Secondary | ICD-10-CM | POA: Diagnosis not present

## 2017-02-24 DIAGNOSIS — R000 Tachycardia, unspecified: Secondary | ICD-10-CM | POA: Diagnosis not present

## 2017-02-24 DIAGNOSIS — M797 Fibromyalgia: Secondary | ICD-10-CM | POA: Diagnosis not present

## 2017-02-24 DIAGNOSIS — Z9289 Personal history of other medical treatment: Secondary | ICD-10-CM | POA: Diagnosis not present

## 2017-02-24 DIAGNOSIS — E876 Hypokalemia: Secondary | ICD-10-CM | POA: Diagnosis not present

## 2017-02-24 DIAGNOSIS — Z882 Allergy status to sulfonamides status: Secondary | ICD-10-CM | POA: Diagnosis not present

## 2017-02-24 DIAGNOSIS — E785 Hyperlipidemia, unspecified: Secondary | ICD-10-CM | POA: Diagnosis not present

## 2017-02-24 DIAGNOSIS — Z87891 Personal history of nicotine dependence: Secondary | ICD-10-CM | POA: Diagnosis not present

## 2017-02-24 DIAGNOSIS — I10 Essential (primary) hypertension: Secondary | ICD-10-CM | POA: Diagnosis not present

## 2017-02-24 DIAGNOSIS — J45909 Unspecified asthma, uncomplicated: Secondary | ICD-10-CM | POA: Diagnosis not present

## 2017-02-24 DIAGNOSIS — R0602 Shortness of breath: Secondary | ICD-10-CM | POA: Diagnosis not present

## 2017-02-24 DIAGNOSIS — G35 Multiple sclerosis: Secondary | ICD-10-CM | POA: Diagnosis not present

## 2017-02-24 DIAGNOSIS — Z886 Allergy status to analgesic agent status: Secondary | ICD-10-CM | POA: Diagnosis not present

## 2017-02-24 DIAGNOSIS — Z880 Allergy status to penicillin: Secondary | ICD-10-CM | POA: Diagnosis not present

## 2017-02-24 DIAGNOSIS — Z885 Allergy status to narcotic agent status: Secondary | ICD-10-CM | POA: Diagnosis not present

## 2017-02-27 ENCOUNTER — Encounter: Payer: Self-pay | Admitting: Family Medicine

## 2017-02-27 ENCOUNTER — Telehealth: Payer: Self-pay

## 2017-02-27 ENCOUNTER — Ambulatory Visit (INDEPENDENT_AMBULATORY_CARE_PROVIDER_SITE_OTHER): Payer: Medicare Other | Admitting: Family Medicine

## 2017-02-27 VITALS — BP 144/77 | HR 78 | Wt 213.0 lb

## 2017-02-27 DIAGNOSIS — R233 Spontaneous ecchymoses: Secondary | ICD-10-CM

## 2017-02-27 DIAGNOSIS — R490 Dysphonia: Secondary | ICD-10-CM | POA: Diagnosis not present

## 2017-02-27 DIAGNOSIS — E876 Hypokalemia: Secondary | ICD-10-CM

## 2017-02-27 DIAGNOSIS — R000 Tachycardia, unspecified: Secondary | ICD-10-CM

## 2017-02-27 DIAGNOSIS — I10 Essential (primary) hypertension: Secondary | ICD-10-CM

## 2017-02-27 DIAGNOSIS — R238 Other skin changes: Secondary | ICD-10-CM

## 2017-02-27 DIAGNOSIS — R109 Unspecified abdominal pain: Secondary | ICD-10-CM | POA: Diagnosis not present

## 2017-02-27 DIAGNOSIS — R2232 Localized swelling, mass and lump, left upper limb: Secondary | ICD-10-CM | POA: Diagnosis not present

## 2017-02-27 LAB — POCT URINALYSIS DIPSTICK
Bilirubin, UA: NEGATIVE
Blood, UA: NEGATIVE
Glucose, UA: NEGATIVE
Leukocytes, UA: NEGATIVE
Nitrite, UA: NEGATIVE
Protein, UA: NEGATIVE
Spec Grav, UA: 1.02 (ref 1.010–1.025)
Urobilinogen, UA: 0.2 E.U./dL
pH, UA: 7 (ref 5.0–8.0)

## 2017-02-27 MED ORDER — NIFEDIPINE 10 MG PO CAPS: 10.0000 mg | ORAL_CAPSULE | Freq: Three times a day (TID) | ORAL | 0 refills | Status: DC
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
## 2017-02-27 NOTE — Telephone Encounter (Signed)
Pt called stating her recent order for BMP does not say it's standing. Called Lonn Georgia [phone]) and gave a verbal order that pt can have standing BMP. Order verbally accepted.

## 2017-03-01 ENCOUNTER — Other Ambulatory Visit: Payer: Self-pay

## 2017-03-01 DIAGNOSIS — R531 Weakness: Secondary | ICD-10-CM | POA: Diagnosis not present

## 2017-03-01 DIAGNOSIS — K2270 Barrett's esophagus without dysplasia: Secondary | ICD-10-CM | POA: Diagnosis not present

## 2017-03-01 DIAGNOSIS — Z888 Allergy status to other drugs, medicaments and biological substances status: Secondary | ICD-10-CM | POA: Diagnosis not present

## 2017-03-01 DIAGNOSIS — Z881 Allergy status to other antibiotic agents status: Secondary | ICD-10-CM | POA: Diagnosis not present

## 2017-03-01 DIAGNOSIS — Z79899 Other long term (current) drug therapy: Secondary | ICD-10-CM | POA: Diagnosis not present

## 2017-03-01 DIAGNOSIS — I050 Rheumatic mitral stenosis: Secondary | ICD-10-CM | POA: Diagnosis not present

## 2017-03-01 DIAGNOSIS — Z882 Allergy status to sulfonamides status: Secondary | ICD-10-CM | POA: Diagnosis not present

## 2017-03-01 DIAGNOSIS — Z87891 Personal history of nicotine dependence: Secondary | ICD-10-CM | POA: Diagnosis not present

## 2017-03-01 DIAGNOSIS — E269 Hyperaldosteronism, unspecified: Secondary | ICD-10-CM | POA: Diagnosis not present

## 2017-03-01 DIAGNOSIS — I10 Essential (primary) hypertension: Secondary | ICD-10-CM | POA: Diagnosis not present

## 2017-03-01 DIAGNOSIS — R569 Unspecified convulsions: Secondary | ICD-10-CM | POA: Diagnosis not present

## 2017-03-01 DIAGNOSIS — Z880 Allergy status to penicillin: Secondary | ICD-10-CM | POA: Diagnosis not present

## 2017-03-01 DIAGNOSIS — R0602 Shortness of breath: Secondary | ICD-10-CM | POA: Diagnosis not present

## 2017-03-01 DIAGNOSIS — R002 Palpitations: Secondary | ICD-10-CM | POA: Diagnosis not present

## 2017-03-01 DIAGNOSIS — I471 Supraventricular tachycardia: Secondary | ICD-10-CM | POA: Diagnosis not present

## 2017-03-01 DIAGNOSIS — R0789 Other chest pain: Secondary | ICD-10-CM | POA: Diagnosis not present

## 2017-03-01 DIAGNOSIS — R451 Restlessness and agitation: Secondary | ICD-10-CM | POA: Diagnosis not present

## 2017-03-01 DIAGNOSIS — Z885 Allergy status to narcotic agent status: Secondary | ICD-10-CM | POA: Diagnosis not present

## 2017-03-01 DIAGNOSIS — R2232 Localized swelling, mass and lump, left upper limb: Secondary | ICD-10-CM

## 2017-03-01 DIAGNOSIS — R079 Chest pain, unspecified: Secondary | ICD-10-CM | POA: Diagnosis not present

## 2017-03-01 DIAGNOSIS — E785 Hyperlipidemia, unspecified: Secondary | ICD-10-CM | POA: Diagnosis not present

## 2017-03-01 DIAGNOSIS — K219 Gastro-esophageal reflux disease without esophagitis: Secondary | ICD-10-CM | POA: Diagnosis not present

## 2017-03-02 ENCOUNTER — Telehealth: Payer: Self-pay

## 2017-03-02 ENCOUNTER — Other Ambulatory Visit: Payer: Self-pay | Admitting: Family Medicine

## 2017-03-02 DIAGNOSIS — R002 Palpitations: Secondary | ICD-10-CM | POA: Diagnosis not present

## 2017-03-02 DIAGNOSIS — R079 Chest pain, unspecified: Secondary | ICD-10-CM | POA: Diagnosis not present

## 2017-03-02 DIAGNOSIS — R0602 Shortness of breath: Secondary | ICD-10-CM | POA: Diagnosis not present

## 2017-03-02 DIAGNOSIS — Z79899 Other long term (current) drug therapy: Secondary | ICD-10-CM | POA: Diagnosis not present

## 2017-03-02 DIAGNOSIS — R0789 Other chest pain: Secondary | ICD-10-CM | POA: Diagnosis not present

## 2017-03-02 DIAGNOSIS — I10 Essential (primary) hypertension: Secondary | ICD-10-CM | POA: Diagnosis not present

## 2017-03-02 DIAGNOSIS — I471 Supraventricular tachycardia: Secondary | ICD-10-CM | POA: Diagnosis not present

## 2017-03-02 NOTE — Telephone Encounter (Signed)
-----   Message from Huel Cote, LPN sent at [id number]  8:23 AM EDT ----- Regarding: FW: US soft tissue   ----- Message ----- From: Katha Hamming Sent: 02/28/2017   4:51 PM To: [name], MD, # Subject: US soft tissue                                 Per Ivin Booty, our ultrasound technologist, this order should be changed to the [place] code of [place].  [place] Right upper extrem LTD nonvascular.  Thanks, Kathlene November [facility] - imaging

## 2017-03-02 NOTE — Telephone Encounter (Signed)
Order properly placed by Levada Dy. -EH/RMA

## 2017-03-02 NOTE — Progress Notes (Signed)
error 

## 2017-03-03 DIAGNOSIS — Z711 Person with feared health complaint in whom no diagnosis is made: Secondary | ICD-10-CM | POA: Diagnosis not present

## 2017-03-03 DIAGNOSIS — Z87891 Personal history of nicotine dependence: Secondary | ICD-10-CM | POA: Diagnosis not present

## 2017-03-03 DIAGNOSIS — R0600 Dyspnea, unspecified: Secondary | ICD-10-CM | POA: Diagnosis not present

## 2017-03-03 DIAGNOSIS — J45909 Unspecified asthma, uncomplicated: Secondary | ICD-10-CM | POA: Diagnosis not present

## 2017-03-03 DIAGNOSIS — I10 Essential (primary) hypertension: Secondary | ICD-10-CM | POA: Diagnosis not present

## 2017-03-03 DIAGNOSIS — G35 Multiple sclerosis: Secondary | ICD-10-CM | POA: Diagnosis not present

## 2017-03-03 DIAGNOSIS — R05 Cough: Secondary | ICD-10-CM | POA: Diagnosis not present

## 2017-03-04 DIAGNOSIS — R0600 Dyspnea, unspecified: Secondary | ICD-10-CM | POA: Diagnosis not present

## 2017-03-05 ENCOUNTER — Ambulatory Visit (HOSPITAL_BASED_OUTPATIENT_CLINIC_OR_DEPARTMENT_OTHER): Payer: Medicare Other

## 2017-03-06 ENCOUNTER — Encounter: Payer: Self-pay | Admitting: Family Medicine

## 2017-03-06 ENCOUNTER — Ambulatory Visit (INDEPENDENT_AMBULATORY_CARE_PROVIDER_SITE_OTHER): Payer: Medicare Other | Admitting: Family Medicine

## 2017-03-06 ENCOUNTER — Ambulatory Visit (HOSPITAL_BASED_OUTPATIENT_CLINIC_OR_DEPARTMENT_OTHER)
Admission: RE | Admit: 2017-03-06 | Discharge: 2017-03-06 | Disposition: A | Discharge status: Home / Self Care | Payer: Medicare Other | Source: Ambulatory Visit | Attending: Family Medicine | Admitting: Family Medicine

## 2017-03-06 VITALS — BP 136/78 | HR 75 | Ht 62.0 in | Wt 212.0 lb

## 2017-03-06 DIAGNOSIS — R2232 Localized swelling, mass and lump, left upper limb: Secondary | ICD-10-CM | POA: Diagnosis not present

## 2017-03-06 DIAGNOSIS — R0602 Shortness of breath: Secondary | ICD-10-CM

## 2017-03-06 DIAGNOSIS — R42 Dizziness and giddiness: Secondary | ICD-10-CM | POA: Diagnosis not present

## 2017-03-06 DIAGNOSIS — R498 Other voice and resonance disorders: Secondary | ICD-10-CM

## 2017-03-06 DIAGNOSIS — R490 Dysphonia: Secondary | ICD-10-CM | POA: Diagnosis not present

## 2017-03-06 DIAGNOSIS — I10 Essential (primary) hypertension: Secondary | ICD-10-CM | POA: Diagnosis not present

## 2017-03-06 DIAGNOSIS — R5383 Other fatigue: Secondary | ICD-10-CM | POA: Diagnosis not present

## 2017-03-06 DIAGNOSIS — J385 Laryngeal spasm: Secondary | ICD-10-CM | POA: Diagnosis not present

## 2017-03-06 DIAGNOSIS — G4733 Obstructive sleep apnea (adult) (pediatric): Secondary | ICD-10-CM | POA: Diagnosis not present

## 2017-03-06 DIAGNOSIS — R2231 Localized swelling, mass and lump, right upper limb: Secondary | ICD-10-CM | POA: Diagnosis not present

## 2017-03-06 DIAGNOSIS — R0683 Snoring: Secondary | ICD-10-CM | POA: Diagnosis not present

## 2017-03-06 NOTE — Progress Notes (Signed)
Subjective:    CC:   HPI: Follow-up hypertension-she's still taking the metoprolol but she is concerned because she still having intermittent episodes of tachycardia. We had discussed her trying nifedipine to also help lower her blood pressure. She actually never tried it before. She has not had a chance to try it yet. Her internist at the hospital is actually recommended maybe trying hydralazine. She was given a perception for 5 mg to take twice a day. She hasn't had a chance to start it but did feel the bottle and brought it with her today. She was recently hospitalized. She started feeling some chest pressure particularly on the left side radiating into her left back and went to the emergency department. They decided to admit her overnight and go ahead and do a stress test which was normal. They did do the stress test well on metoprolol. She does have a follow-up with Dr. act very her regular cardiologist on August 6. She felt like labetalol did a little bit better job in controlling her blood pressure but says it made her feel tired and also made her feel short of breath. They're now in retrospect she's not sure if the shortness of breath was more related to the tension in her throat which she is currently getting care for. In fact she just started speech therapy where they are working on getting her throat more open and helping with her voice. She's had weakness and cracking of the voice.  Today she just feels a little weak and unsteady in general. She's not sure if it was just the stress of driving to [place] and then driving here. She also had some road traffic which got her upset as she was fearful she was going to be late for her appointment. She just feels a little"bobble-headed" today.  And like her balance is off just a little.  Past medical history, Surgical history, Family history not pertinant except as noted below, Social history, Allergies, and medications have been entered into the medical  record, reviewed, and corrections made.   Review of Systems: No fevers, chills, night sweats, weight loss, chest pain, or shortness of breath.   Objective:    General: Well Developed, well nourished, and in no acute distress.  Neuro: Alert and oriented x3, extra-ocular muscles intact, sensation grossly intact.  HEENT: Normocephalic, atraumatic  Skin: Warm and dry, no rashes. Cardiac: Regular rate and rhythm, no murmurs rubs or gallops, no lower extremity edema.  Respiratory: Clear to auscultation bilaterally. Not using accessory muscles, speaking in full sentences.   Impression and Recommendations:    Hypertension-continue metoprolol. I think it's a Foley worth trying hydralazine. She could still stand to have her blood pressure lowered by about another 5-10 points.  Shortness of breath-seems to have been better since starting the speech therapy and learning how to do them neck massages to release the tension in her neck.  Voice weakness-seeing ENT in working with a therapist.  Chest pressure-resolved. She did not have a cardiac event was ruled out during her hospital stay. Keep follow-up appointment with cardiology.  Feeling a little lightheaded today. This recurrent. Blood pressure actually looks good. Encouraged her to go home and hydrate well.  Internist at the hospital also missed and vaginal hormones. Discussed that we could certainly consider that she actually needs to follow with her oncologist first and get approval for hormone replacement therapy before we can start that.

## 2017-03-07 DIAGNOSIS — E876 Hypokalemia: Secondary | ICD-10-CM | POA: Diagnosis not present

## 2017-03-10 IMAGING — DX DG TOE GREAT 2+V*R*
3 series · 3 of 3 positions shown · non-contrast
Comparison: None.

CLINICAL DATA: Patient states that she was running through her
house and hit her right great toe, has bruising and pains to entire
right great toe, unable to fully turn right foot in for lateral
image, no other complaints

EXAM:
RIGHT GREAT TOE

[toe ap]
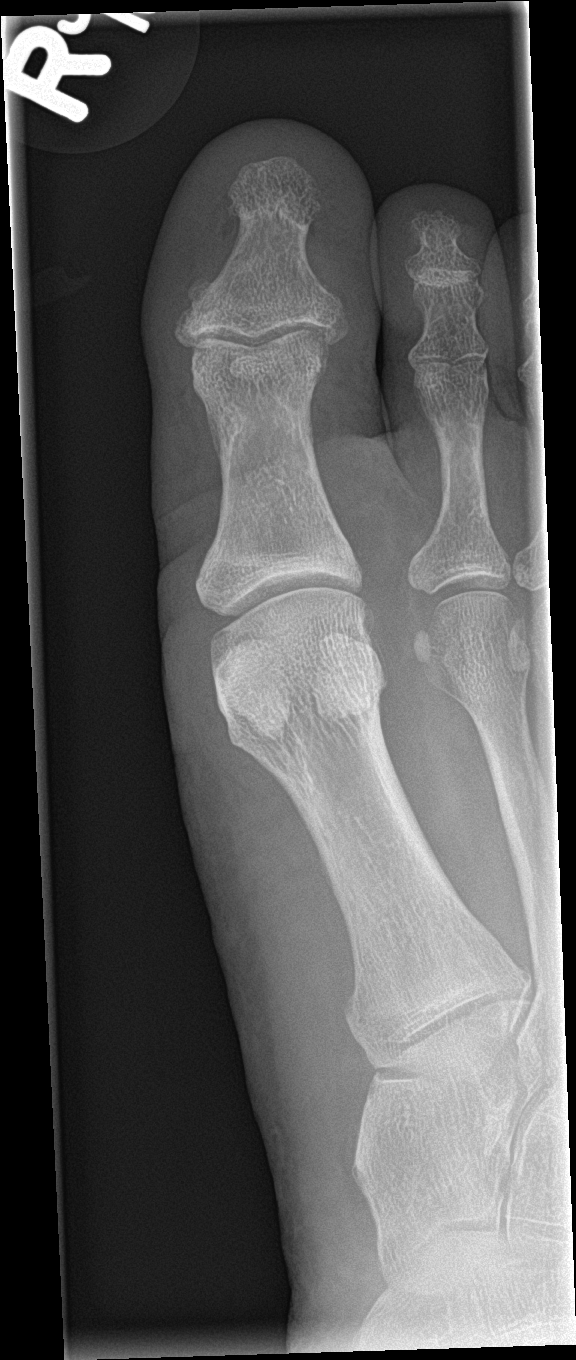

[toe obl]
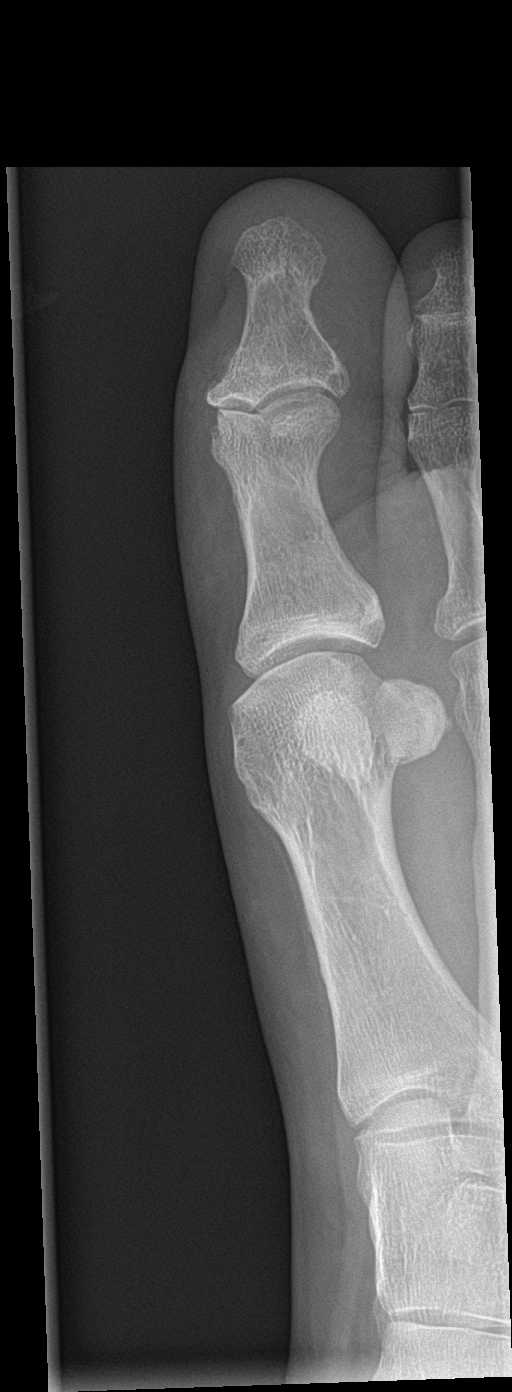

[toe lat]
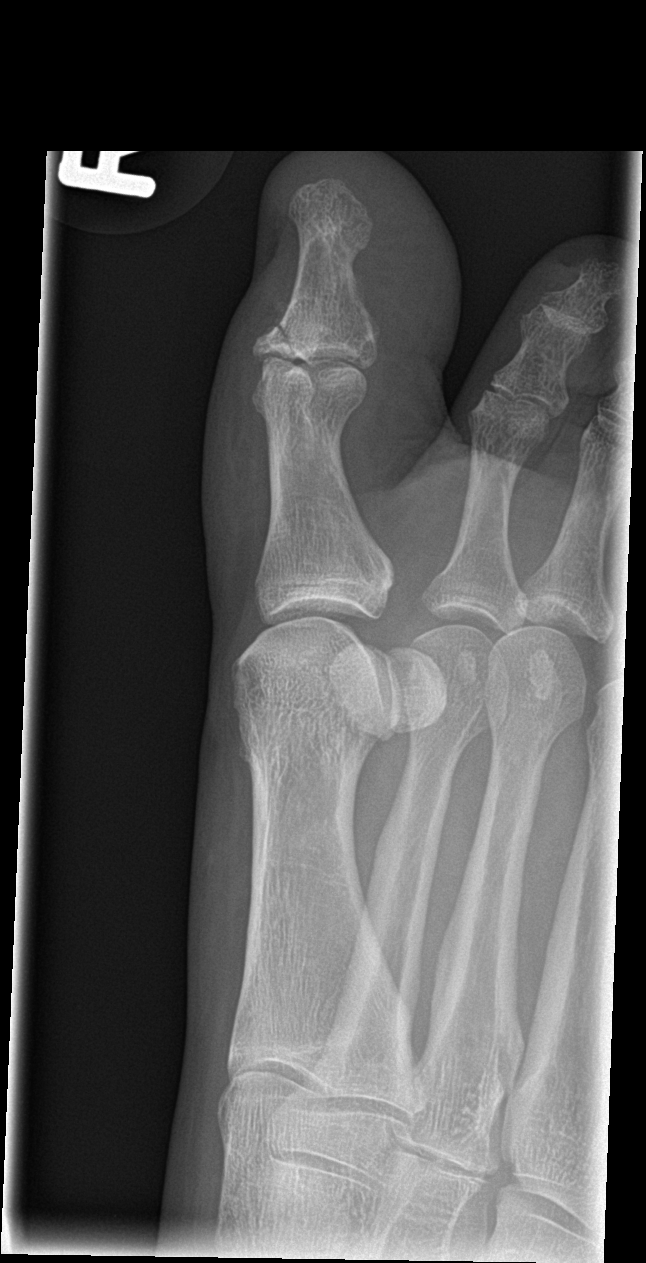

[3 of 3 positions shown; findings below may reference images not displayed]

FINDINGS: There is a nondisplaced fracture across the dorsal, medial base of
the distal phalanx. Fracture intersects the articular surface at the
IP joint. No fracture comminution.

There are no other fractures.  Joints are normally aligned.

There is soft tissue swelling at the IP joint and proximal aspect of
the dorsal, medial great toe.
IMPRESSION: Nondisplaced fracture of the base of the distal phalanx of the right
great toe. This involves the IP joint articular surface.

## 2017-03-17 DIAGNOSIS — J45909 Unspecified asthma, uncomplicated: Secondary | ICD-10-CM | POA: Diagnosis not present

## 2017-03-17 DIAGNOSIS — M797 Fibromyalgia: Secondary | ICD-10-CM | POA: Diagnosis not present

## 2017-03-17 DIAGNOSIS — R202 Paresthesia of skin: Secondary | ICD-10-CM | POA: Diagnosis not present

## 2017-03-17 DIAGNOSIS — Z882 Allergy status to sulfonamides status: Secondary | ICD-10-CM | POA: Diagnosis not present

## 2017-03-17 DIAGNOSIS — R002 Palpitations: Secondary | ICD-10-CM | POA: Diagnosis not present

## 2017-03-17 DIAGNOSIS — E785 Hyperlipidemia, unspecified: Secondary | ICD-10-CM | POA: Diagnosis not present

## 2017-03-17 DIAGNOSIS — Z880 Allergy status to penicillin: Secondary | ICD-10-CM | POA: Diagnosis not present

## 2017-03-17 DIAGNOSIS — G35 Multiple sclerosis: Secondary | ICD-10-CM | POA: Diagnosis not present

## 2017-03-17 DIAGNOSIS — Z886 Allergy status to analgesic agent status: Secondary | ICD-10-CM | POA: Diagnosis not present

## 2017-03-17 DIAGNOSIS — M79601 Pain in right arm: Secondary | ICD-10-CM | POA: Diagnosis not present

## 2017-03-17 DIAGNOSIS — Z885 Allergy status to narcotic agent status: Secondary | ICD-10-CM | POA: Diagnosis not present

## 2017-03-17 DIAGNOSIS — Z9289 Personal history of other medical treatment: Secondary | ICD-10-CM | POA: Diagnosis not present

## 2017-03-17 DIAGNOSIS — Z888 Allergy status to other drugs, medicaments and biological substances status: Secondary | ICD-10-CM | POA: Diagnosis not present

## 2017-03-17 DIAGNOSIS — I10 Essential (primary) hypertension: Secondary | ICD-10-CM | POA: Diagnosis not present

## 2017-03-17 DIAGNOSIS — Z87891 Personal history of nicotine dependence: Secondary | ICD-10-CM | POA: Diagnosis not present

## 2017-03-19 ENCOUNTER — Encounter (HOSPITAL_BASED_OUTPATIENT_CLINIC_OR_DEPARTMENT_OTHER): Payer: Self-pay

## 2017-03-19 ENCOUNTER — Emergency Department (HOSPITAL_BASED_OUTPATIENT_CLINIC_OR_DEPARTMENT_OTHER)
Admission: EM | Admit: 2017-03-19 | Discharge: 2017-03-19 | Disposition: A | Discharge status: Home / Self Care | Payer: Medicare Other | Attending: Emergency Medicine | Admitting: Emergency Medicine

## 2017-03-19 ENCOUNTER — Emergency Department (HOSPITAL_BASED_OUTPATIENT_CLINIC_OR_DEPARTMENT_OTHER): Payer: Medicare Other

## 2017-03-19 DIAGNOSIS — I10 Essential (primary) hypertension: Secondary | ICD-10-CM | POA: Diagnosis not present

## 2017-03-19 DIAGNOSIS — M79601 Pain in right arm: Secondary | ICD-10-CM | POA: Diagnosis not present

## 2017-03-19 DIAGNOSIS — G8929 Other chronic pain: Secondary | ICD-10-CM | POA: Diagnosis not present

## 2017-03-19 DIAGNOSIS — I491 Atrial premature depolarization: Secondary | ICD-10-CM | POA: Diagnosis not present

## 2017-03-19 DIAGNOSIS — Z91010 Allergy to peanuts: Secondary | ICD-10-CM | POA: Insufficient documentation

## 2017-03-19 DIAGNOSIS — M25511 Pain in right shoulder: Secondary | ICD-10-CM | POA: Diagnosis not present

## 2017-03-19 DIAGNOSIS — Z87891 Personal history of nicotine dependence: Secondary | ICD-10-CM | POA: Diagnosis not present

## 2017-03-19 DIAGNOSIS — Z5321 Procedure and treatment not carried out due to patient leaving prior to being seen by health care provider: Secondary | ICD-10-CM | POA: Diagnosis not present

## 2017-03-19 DIAGNOSIS — I493 Ventricular premature depolarization: Secondary | ICD-10-CM | POA: Diagnosis not present

## 2017-03-19 DIAGNOSIS — I471 Supraventricular tachycardia: Secondary | ICD-10-CM | POA: Diagnosis not present

## 2017-03-19 DIAGNOSIS — M542 Cervicalgia: Secondary | ICD-10-CM | POA: Diagnosis not present

## 2017-03-19 DIAGNOSIS — M25531 Pain in right wrist: Secondary | ICD-10-CM | POA: Diagnosis not present

## 2017-03-19 DIAGNOSIS — J45909 Unspecified asthma, uncomplicated: Secondary | ICD-10-CM | POA: Diagnosis not present

## 2017-03-19 DIAGNOSIS — R202 Paresthesia of skin: Secondary | ICD-10-CM | POA: Diagnosis not present

## 2017-03-19 MED ORDER — ONDANSETRON 4 MG PO TBDP
4.0000 mg | ORAL_TABLET | Freq: Once | ORAL | Status: AC
  Administered 2017-03-19: 4 mg via ORAL

## 2017-03-19 MED ORDER — ONDANSETRON 4 MG PO TBDP: 4.0000 mg | ORAL_TABLET | Freq: Three times a day (TID) | ORAL | 0 refills | Status: DC | PRN

## 2017-03-19 MED ORDER — ONDANSETRON 4 MG PO TBDP
ORAL_TABLET | ORAL | Status: AC
  Filled 2017-03-19: qty 1

## 2017-03-19 MED ORDER — IBUPROFEN 800 MG PO TABS: 800.0000 mg | ORAL_TABLET | Freq: Three times a day (TID) | ORAL | 0 refills | Status: DC | PRN

## 2017-03-19 MED FILL — IBUPROFEN 800 MG TABLET: 800 | 7 days supply | Qty: 21 | Fill #0

## 2017-03-19 NOTE — ED Notes (Signed)
ED Provider at bedside. 

## 2017-03-19 NOTE — ED Triage Notes (Signed)
C/o pain to right arm x 2-3 weeks- with pain to right side of neck-worse with movement-denies injury-NAD-steady gait

## 2017-03-19 NOTE — Discharge Instructions (Signed)
We believe that your symptoms are caused by musculoskeletal strain.  Please read through the included information about additional care such as heating pads, over-the-counter pain medicine.  If you were provided a prescription please use it only as needed and as instructed.  Remember that early mobility and using the affected part of your body is actually better than keeping it immobile. ° °Follow-up with the doctor listed as recommended or return to the emergency department with new or worsening symptoms that concern you. ° °
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
## 2017-03-20 ENCOUNTER — Ambulatory Visit (INDEPENDENT_AMBULATORY_CARE_PROVIDER_SITE_OTHER): Payer: Medicare Other | Admitting: Sports Medicine

## 2017-03-20 ENCOUNTER — Other Ambulatory Visit: Payer: Self-pay | Admitting: Family Medicine

## 2017-03-20 ENCOUNTER — Encounter: Payer: Self-pay | Admitting: Family Medicine

## 2017-03-20 ENCOUNTER — Telehealth: Payer: Self-pay | Admitting: Family Medicine

## 2017-03-20 ENCOUNTER — Ambulatory Visit (INDEPENDENT_AMBULATORY_CARE_PROVIDER_SITE_OTHER): Payer: Medicare Other | Admitting: Family Medicine

## 2017-03-20 VITALS — BP 140/84 | HR 84 | Wt 214.0 lb

## 2017-03-20 DIAGNOSIS — F339 Major depressive disorder, recurrent, unspecified: Secondary | ICD-10-CM | POA: Diagnosis not present

## 2017-03-20 DIAGNOSIS — M5030 Other cervical disc degeneration, unspecified cervical region: Secondary | ICD-10-CM | POA: Diagnosis not present

## 2017-03-20 DIAGNOSIS — Z6839 Body mass index (BMI) 39.0-39.9, adult: Secondary | ICD-10-CM

## 2017-03-20 DIAGNOSIS — G35 Multiple sclerosis: Secondary | ICD-10-CM

## 2017-03-20 DIAGNOSIS — G35D Multiple sclerosis, unspecified: Secondary | ICD-10-CM

## 2017-03-20 DIAGNOSIS — M25511 Pain in right shoulder: Secondary | ICD-10-CM | POA: Diagnosis not present

## 2017-03-20 DIAGNOSIS — I10 Essential (primary) hypertension: Secondary | ICD-10-CM | POA: Diagnosis not present

## 2017-03-20 DIAGNOSIS — R0683 Snoring: Secondary | ICD-10-CM

## 2017-03-20 DIAGNOSIS — R7301 Impaired fasting glucose: Secondary | ICD-10-CM

## 2017-03-20 DIAGNOSIS — R635 Abnormal weight gain: Secondary | ICD-10-CM | POA: Diagnosis not present

## 2017-03-20 LAB — POCT GLYCOSYLATED HEMOGLOBIN (HGB A1C): Hemoglobin A1C: 6

## 2017-03-20 MED ORDER — METOPROLOL TARTRATE 50 MG PO TABS: 50.0000 mg | ORAL_TABLET | Freq: Two times a day (BID) | ORAL | 1 refills | Status: DC

## 2017-03-20 MED ORDER — DULOXETINE HCL 30 MG PO CPEP: 30.0000 mg | ORAL_CAPSULE | Freq: Every day | ORAL | 3 refills | Status: DC

## 2017-03-20 MED ORDER — IBUPROFEN 200 MG PO TABS
800.0000 mg | ORAL_TABLET | Freq: Once | ORAL | Status: AC
  Administered 2017-03-20: 800 mg via ORAL

## 2017-03-20 NOTE — Progress Notes (Signed)
Subjective:    CC: HTN, IFG  HPI:  Hypertension- Pt denies chest pain, SOB, dizziness, or heart palpitations.  Taking meds as directed w/o problems.  Denies medication side effects.  She did see cardiology yesterday and they recommended that she increase her metoprolol to a whole tab. That she has been splitting it in half. They felt like this would help control her PACs, PVCs and intermittent SVT. She has still not started the valsartan and. In fact currently it's on recall some to go ahead and remove it from her medication list.  She's been feeling more down and depressed recently. She said she had about a week where it was difficult for her to get out of bed. Being around her granddaughter tells help bring her out of it. She has been feeling better the last couple of days. She's not really been exercising at this point.  She's feeling very stressed and overwhelmed because she continues to gain weight but she is very inactive as well. She's not on any medications that should be causing weight gain.  She's having persistent neck and right shoulder pain and now getting prickling sensations down her arm. It's been waking her up at night and been extremely painful. In fact she went to the emergency room yesterday. She was given a prescription for 800 mg ibuprofen but did not take it because she was fearful that it may raise her blood pressure after she read the package insert.  She also turned her sleep study back in and wants to go over the results today. She did a home sleep study and turned it in about 2 weeks ago.  Impaired fasting glucose-no increased thirst or urination. No symptoms consistent with hypoglycemia.     Past medical history, Surgical history, Family history not pertinant except as noted below, Social history, Allergies, and medications have been entered into the medical record, reviewed, and corrections made.   Review of Systems: No fevers, chills, night sweats, weight loss,  chest pain, or shortness of breath.   Objective:    General: Well Developed, well nourished, and in no acute distress.  Neuro: Alert and oriented x3, extra-ocular muscles intact, sensation grossly intact.  HEENT: Normocephalic, atraumatic  Skin: Warm and dry, no rashes. Cardiac: Regular rate and rhythm, no murmurs rubs or gallops, no lower extremity edema.  Respiratory: Clear to auscultation bilaterally. Not using accessory muscles, speaking in full sentences.   Impression and Recommendations:   HTN - Blood pressure borderline elevated today but she is also quite anxious she is to see one of our sports medicine provider's and is concerned that she has pelvic and I get an injection today. I did encourage her to increase her metoprolol to whole tab, 50 mg daily.   IFG - 11 A1c up to 6.0 today. Again discussed the importance of healthy diet and regular exercise. I think starting with a walking program for 10-15 minutes a days a great start to eventually progress to maybe 30 minutes a day and maybe even getting back to the gym which is been helpful for her in the past.  Right neck and shoulder pain-seeing sports medicine this afternoon. Acutely here in the office I did give her 800 mg of ibuprofen as well as some ice to place over the shoulder.  Recurrent depression-I do think she would benefit from a therapist but says financially right now she cannot afford it. She says she artery as a lot on medical bills. We discussed the possibility of  starting Cymbalta 30 mg. I think he could be helpful with her mood as well as her chronic muscular skeletal pain and for her fibromyalgia. She said she will think about it so went ahead and sent a prescription to the pharmacy in case she decides she wants to do so.  Snoring -  unfortunately I do not see a copy of the sleep study report so we will call to obtain this.  Weight gain/BMI 39-at this point I really feel like she would benefit from nutrition therapy  counseling session. But she is worried about the cost.  Encouraged her to start exercising daily and start counting her calories.  Time spent 45 minutes, greater than 50% time spent counseling about blood pressure, glucose, Mr. skeletal pain, depression, snoring and abnormal weight gain.

## 2017-03-20 NOTE — Progress Notes (Signed)
  Subjective:    CC: Right shoulder pain  HPI: Jennifer [name] is a 51 year old female, she is a fairly complex medical history mostly related to somatization and hypochondriasis. She does have some mild to moderate orthopedic problems, more recently she has been complaining more of pain radiating from her neck, down the anterior aspect of her upper arm described as burning and bee stings, further down the upper arm to the lateral forearm and to what she describes as mostly the first 3 fingers but occasionally all 5. She also tells me that her pain is significant worse with turning her head to the right. Years ago we obtain a C-spine MRI that showed right-sided foraminal stenosis at multiple levels, we added gabapentin, and no further intervention was needed. She is tearful in the exam room and afraid.   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 She's never had any evidence of demyelination in her brain or spinal cord. She did have a lumbar puncture with 3 oligoclonal bands, but overall not enough symptoms to have a diagnosis of multiple sclerosis.   Past medical history:  Negative.  See flowsheet/record as well for more information.  Surgical history: Negative.  See flowsheet/record as well for more information.  Family history: Negative.  See flowsheet/record as well for more information.  Social history: Negative.  See flowsheet/record as well for more information.  Allergies, and medications have been entered into the medical record, reviewed, and no changes needed.   Review of Systems: No fevers, chills, night sweats, weight loss, chest pain, or shortness of breath.   Objective:    General: Well Developed, well nourished, and in no acute distress.  Neuro: Alert and oriented x3, extra-ocular muscles intact, sensation grossly  intact.  HEENT: Normocephalic, atraumatic, pupils equal round reactive to light, neck supple, no masses, no lymphadenopathy, thyroid nonpalpable.  Skin: Warm and dry, no rashes. Cardiac: Regular rate and rhythm, no murmurs rubs or gallops, no lower extremity edema.  Respiratory: Clear to auscultation bilaterally. Not using accessory muscles, speaking in full sentences. Neck: Negative spurling's Full neck range of motion Grip strength and sensation normal in bilateral hands Strength good C4 to T1 distribution No sensory change to C4 to T1 Reflexes normal  Impression and Recommendations:    DDD (degenerative disc disease), cervical Recurrence of right cervical radiculitis, mostly upper cervical, C5 versus C6. MRI from February did show cervical degenerative changes with mild multilevel right foraminal stenosis. At this point we are going to proceed with a right C6-C7 interlaminar epidural. She does have some pathology in her shoulder but I dont think her current symptoms are coming from her shoulder. Dr. Madilyn Fireman did start Cymbalta which I think will be tremendously effective.   There was some discussion about multiple sclerosis which seems she completely does not have, she had 3 oligoclonal bands on a lumbar puncture, but no imaging findings to suggest multiple sclerosis. 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  I spent 40 minutes with this patient, greater than 50% was face-to-face time counseling regarding the above diagnoses

## 2017-03-20 NOTE — Telephone Encounter (Signed)
Please call and see if we can get her recent home sleep study test. She said she actually turned it in about 2 weeks ago and I have not seen the report yet.

## 2017-03-20 NOTE — Assessment & Plan Note (Addendum)
Recurrence of right cervical radiculitis, mostly upper cervical, C5 versus C6. MRI from February did show cervical degenerative changes with mild multilevel right foraminal stenosis. At this point we are going to proceed with a right C6-C7 interlaminar epidural. She does have some pathology in her shoulder but I dont think her current symptoms are coming from her shoulder. Dr. Madilyn Fireman did start Cymbalta which I think will be tremendously effective.   There was some discussion about multiple sclerosis which seems she completely does not have, she had 3 oligoclonal bands on a lumbar puncture, but no imaging findings to suggest multiple sclerosis. 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

## 2017-03-21 ENCOUNTER — Telehealth: Payer: Self-pay | Admitting: *Deleted

## 2017-03-21 NOTE — Telephone Encounter (Signed)
Call pt: sleep study results show that she slept for about 7-1/2 hours which is a good sampling time. Her apnea-hypopnea index was 5.6 indicating mild sleep apnea. She had more apneas lying straight on her back. She maintained her oxygen well while sleeping. This is mild enough that we do not have to treat it but we could consider it especially since she is having some difficulty with her blood pressure sometimes controlling sleep apnea can improve blood pressure but hers is so mild that it may or may not make a huge difference.

## 2017-03-21 NOTE — Telephone Encounter (Signed)
Spoke w/roberta she will get pt scheduled.[name] [place]

## 2017-03-22 ENCOUNTER — Telehealth: Payer: Self-pay

## 2017-03-22 NOTE — Telephone Encounter (Signed)
Patient called stated that she wants her MRI done @ [facility] where she has gotten her previous MRIs done at. Patient  Wants to know what is the reason for MRI? stated that if you trying to rule out MS she already got a Dx from Dr. Berdine Addison. Please advise. [name],CMA

## 2017-03-22 NOTE — Telephone Encounter (Signed)
No authorization required for MR BRAIN W WO CONTRAST.

## 2017-03-22 NOTE — Telephone Encounter (Signed)
cALLED PATIENT TO GIVE RESULTS TO BUT KEEP GET A FAST BUSY SIGNAL. Shulamit Donofrio,CMA

## 2017-03-22 NOTE — Telephone Encounter (Signed)
Spoke to patient gave her results as noted below. Patient verbally understands.

## 2017-03-23 ENCOUNTER — Telehealth: Payer: Self-pay

## 2017-03-23 NOTE — Telephone Encounter (Signed)
Guess this has to be done where she wants it.

## 2017-03-23 NOTE — Telephone Encounter (Signed)
Patient called  Stated that she would like to get her MRI done at [facility] and she wants an open MRI instead of closed. Please advise as this is the patient 2nd call in 2 days. She is scheduled for [facility] on Saturday so she wants to get this taken care of. [name],CMA

## 2017-03-23 NOTE — Telephone Encounter (Signed)
Jennifer [name] has already taken care of this and patient has been informed. [name],CMA

## 2017-03-26 ENCOUNTER — Telehealth: Payer: Self-pay

## 2017-03-26 NOTE — Telephone Encounter (Signed)
Pt is having swelling in lower extremities, and states that her feet "weird", she is wondering if she needs an appointment and sooner rather than later.

## 2017-03-26 NOTE — Telephone Encounter (Signed)
Ok to put her on at 1 on Wednesday if needed.

## 2017-03-27 NOTE — Telephone Encounter (Signed)
Left VM for Pt to return clinic call.  

## 2017-03-28 ENCOUNTER — Encounter: Payer: Self-pay | Admitting: Family Medicine

## 2017-03-28 ENCOUNTER — Ambulatory Visit (INDEPENDENT_AMBULATORY_CARE_PROVIDER_SITE_OTHER): Payer: Medicare Other | Admitting: Family Medicine

## 2017-03-28 VITALS — BP 140/78 | HR 91 | Wt 213.0 lb

## 2017-03-28 DIAGNOSIS — R252 Cramp and spasm: Secondary | ICD-10-CM | POA: Diagnosis not present

## 2017-03-28 DIAGNOSIS — R202 Paresthesia of skin: Secondary | ICD-10-CM | POA: Diagnosis not present

## 2017-03-28 DIAGNOSIS — M25511 Pain in right shoulder: Secondary | ICD-10-CM

## 2017-03-28 DIAGNOSIS — M549 Dorsalgia, unspecified: Secondary | ICD-10-CM

## 2017-03-28 DIAGNOSIS — K5909 Other constipation: Secondary | ICD-10-CM | POA: Diagnosis not present

## 2017-03-28 DIAGNOSIS — D509 Iron deficiency anemia, unspecified: Secondary | ICD-10-CM | POA: Diagnosis not present

## 2017-03-28 DIAGNOSIS — R200 Anesthesia of skin: Secondary | ICD-10-CM

## 2017-03-28 DIAGNOSIS — E876 Hypokalemia: Secondary | ICD-10-CM | POA: Diagnosis not present

## 2017-03-28 LAB — BASIC METABOLIC PANEL
Anion gap: 16
BUN: 13 (ref 4–21)
Calcium: 9.2
Carbon Dioxide, Total: 26
Chloride: 104
Creatinine: 0.6 (ref 0.5–1.1)
EGFR (Non-African Amer.): >60
Glucose: 98
Potassium: 3.9 (ref 3.4–5.3)
Sodium: 146 (ref 137–147)

## 2017-03-28 LAB — VITAMIN B1: Vitamin B1 (Thiamine), Blood: 147

## 2017-03-28 LAB — VITAMIN B6: Vitamin B6: 5

## 2017-03-28 LAB — VITAMIN B12: Vitamin B-12: 393

## 2017-03-28 NOTE — Telephone Encounter (Signed)
Patient scheduled and she is aware of the appointment time.

## 2017-03-28 NOTE — Progress Notes (Signed)
Subjective:    Patient ID: Jennifer [name], female    DOB: 01-23-1966, 51 y.o.   MRN: [id number]  HPI [age]-year-old female comes in today specifically for bilateral feet and lower leg tingling and pain. She says it's been going on for a few weeks at this point but it seems to be getting worse. Her right is worse than the left. She has been getting pain in both feet. She says when she wakes up in the morning and actually feels swollen even though they don't actually looks swollen. They look a little bit more red to her than usual. She also reports tingling and numbness up to the mid tibia area. No recent changes to medications. We have written a perception for Cymbalta I saw her week ago. She has picked it up but has not actually started it yet. She is also getting that most numbness feeling shooting pain down the right arm as well but she is also getting pain when she reaches back and her right shoulder. She is scheduled for brain MRI tomorrow. She also reports that her feet felt ice cold.  She's also been experiencing a lot of drawing and cramping of the muscles particularly in her feet. She says she's notices when she gets on all fours to clean the litter box and her feet are completely flat against the floor it will sometimes tend to cause cramping and spasms. She usually has to get up. It's incredibly painful when it does happen. Next  Chronic constipation-she says her bowels actually been moving okay. She did take some lactulose a couple days ago and had some results with it.  She also reports a sharp shooting pain that will start in her mid back and upper spine and then trigger her headache. It mostly happens when she is lying flat on her back with her head just slightly proximal to that she can read her phone.  Review of Systems     Objective:   Physical Exam  Constitutional: She is oriented to person, place, and time. She appears well-developed and well-nourished.  HENT:  Head:  Normocephalic and atraumatic.  Eyes: Conjunctivae and EOM are normal.  Cardiovascular: Normal rate.   Pulmonary/Chest: Effort normal.  Musculoskeletal:  Feet appear normal. No significant swelling of the feet. There is some trace pretibial edema on the left side compared to none on the right. Dorsal pedal pulses and posterior tibial pulses 2+ bilaterally. Ankles with normal range of motion. She was tender to touch over her feet and anterior shins.  Neurological: She is alert and oriented to person, place, and time.  Skin: Skin is dry. No pallor.  Psychiatric: She has a normal mood and affect. Her behavior is normal.  Vitals reviewed.         Assessment & Plan:  Numbness and tingling in both feet - Unclear etiology. Will check for deficiencies. Will call with results.  REcommend elevate her feet.   She does have some trace eating edema along the left anterior tibia but none on the right. Consider early onset peripheral neuropathy.  Muscle cramping/drawing-will evaluate for potassium and electrolyte imbalance. Will also check CK. Recent A1c shows that she is prediabetic. So we'll check glucose as well.   Chronic constipation-seems stable. That she has been more nauseated recently.  Back pain that shoots up to her neck and then triggers a headache-plain that it sounds like it's positional since it seems to occur most the time when she's lying flat on  her back. Could be a discogenic issue that's triggering this.  Right shoulder pain-she did get relief from 800 mg ibuprofen that we gave her when she was here in the office. She has not taken it since then. Encouraged her to take it again and she did take it before she left the office today we gave her an ice pack. Encouraged her to schedule her follow-up with Dr. Dianah Field on Friday or early next week.

## 2017-03-29 ENCOUNTER — Telehealth: Payer: Self-pay | Admitting: Sports Medicine

## 2017-03-29 ENCOUNTER — Ambulatory Visit: Payer: Self-pay | Admitting: Family Medicine

## 2017-03-29 ENCOUNTER — Telehealth: Payer: Self-pay | Admitting: Family Medicine

## 2017-03-29 DIAGNOSIS — M62838 Other muscle spasm: Secondary | ICD-10-CM | POA: Diagnosis not present

## 2017-03-29 DIAGNOSIS — R51 Headache: Secondary | ICD-10-CM | POA: Diagnosis not present

## 2017-03-29 DIAGNOSIS — E785 Hyperlipidemia, unspecified: Secondary | ICD-10-CM | POA: Diagnosis not present

## 2017-03-29 DIAGNOSIS — M542 Cervicalgia: Secondary | ICD-10-CM | POA: Diagnosis not present

## 2017-03-29 DIAGNOSIS — Z880 Allergy status to penicillin: Secondary | ICD-10-CM | POA: Diagnosis not present

## 2017-03-29 DIAGNOSIS — J45909 Unspecified asthma, uncomplicated: Secondary | ICD-10-CM | POA: Diagnosis not present

## 2017-03-29 DIAGNOSIS — M25561 Pain in right knee: Secondary | ICD-10-CM | POA: Diagnosis not present

## 2017-03-29 DIAGNOSIS — M7989 Other specified soft tissue disorders: Secondary | ICD-10-CM | POA: Diagnosis not present

## 2017-03-29 DIAGNOSIS — R202 Paresthesia of skin: Secondary | ICD-10-CM | POA: Diagnosis not present

## 2017-03-29 DIAGNOSIS — G35 Multiple sclerosis: Secondary | ICD-10-CM | POA: Diagnosis not present

## 2017-03-29 DIAGNOSIS — Z885 Allergy status to narcotic agent status: Secondary | ICD-10-CM | POA: Diagnosis not present

## 2017-03-29 DIAGNOSIS — M25511 Pain in right shoulder: Secondary | ICD-10-CM | POA: Diagnosis not present

## 2017-03-29 DIAGNOSIS — Z9289 Personal history of other medical treatment: Secondary | ICD-10-CM | POA: Diagnosis not present

## 2017-03-29 DIAGNOSIS — Z882 Allergy status to sulfonamides status: Secondary | ICD-10-CM | POA: Diagnosis not present

## 2017-03-29 DIAGNOSIS — M79605 Pain in left leg: Secondary | ICD-10-CM | POA: Diagnosis not present

## 2017-03-29 DIAGNOSIS — M5030 Other cervical disc degeneration, unspecified cervical region: Secondary | ICD-10-CM | POA: Diagnosis not present

## 2017-03-29 DIAGNOSIS — M797 Fibromyalgia: Secondary | ICD-10-CM | POA: Diagnosis not present

## 2017-03-29 DIAGNOSIS — Z87891 Personal history of nicotine dependence: Secondary | ICD-10-CM | POA: Diagnosis not present

## 2017-03-29 DIAGNOSIS — Z888 Allergy status to other drugs, medicaments and biological substances status: Secondary | ICD-10-CM | POA: Diagnosis not present

## 2017-03-29 DIAGNOSIS — M79604 Pain in right leg: Secondary | ICD-10-CM | POA: Diagnosis not present

## 2017-03-29 DIAGNOSIS — Z886 Allergy status to analgesic agent status: Secondary | ICD-10-CM | POA: Diagnosis not present

## 2017-03-29 DIAGNOSIS — D320 Benign neoplasm of cerebral meninges: Secondary | ICD-10-CM | POA: Diagnosis not present

## 2017-03-29 DIAGNOSIS — G8929 Other chronic pain: Secondary | ICD-10-CM | POA: Diagnosis not present

## 2017-03-29 DIAGNOSIS — I10 Essential (primary) hypertension: Secondary | ICD-10-CM | POA: Diagnosis not present

## 2017-03-29 NOTE — Telephone Encounter (Signed)
Call patient: Electrolytes look great. Kidney function is stable. Calcium is normal. Glucose is normal. Vitamin B12 actually looks good.  Beatrice Lecher, MD

## 2017-03-29 NOTE — Telephone Encounter (Signed)
Pt called clinic today stating she did not want to get the Brain MRI with and without contrast. Pt states she doesn't want contrast. Spoke with ordering Provider and advised Pt that if she is not getting contrast, then she shouldn't get the scan at all.  Pt opted to leave the imaging facility today. Pt states she would rather have a CT scan than MRI. Advised Pt she does have an appt next week with ordering Provider and this could be discussed at that time.
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
## 2017-03-29 NOTE — Telephone Encounter (Signed)
Thank you Jennifer [name] for this detailed description of the issue and your patience with this patient.  This patient has misrepresented and falsified a physician order from me, this is akin to forgery, for this reason I will no longer be providing her with sports medicine care. There are several other sports medicine providers in the area.  Letter written.

## 2017-03-30 ENCOUNTER — Encounter: Payer: Self-pay | Admitting: Family Medicine

## 2017-03-30 ENCOUNTER — Ambulatory Visit (INDEPENDENT_AMBULATORY_CARE_PROVIDER_SITE_OTHER): Payer: Medicare Other | Admitting: Family Medicine

## 2017-03-30 DIAGNOSIS — M25511 Pain in right shoulder: Secondary | ICD-10-CM

## 2017-03-30 MED ORDER — DICLOFENAC SODIUM 75 MG PO TBEC: 75.0000 mg | DELAYED_RELEASE_TABLET | Freq: Two times a day (BID) | ORAL | 1 refills | Status: DC

## 2017-03-30 NOTE — Telephone Encounter (Signed)
Called pt and gave her the results.

## 2017-03-30 NOTE — Patient Instructions (Signed)
You have rotator cuff impingement Try to avoid painful activities (overhead activities, lifting with extended arm) as much as possible. Diclofenac 75mg  twice a day with food for pain and inflammation. Can take tylenol in addition to this. Subacromial injection may be beneficial to help with pain and to decrease inflammation. Consider physical therapy with transition to home exercise program. Do home exercise program with theraband and scapular stabilization exercises daily 3 sets of 10 once a day. If not improving at follow-up we will consider further imaging, injection, physical therapy, and/or nitro patches. Follow up with me in 5-6 weeks for your shoulder but can make appointment sooner to review your neck and low back.

## 2017-04-01 IMAGING — CR DG ABDOMEN 1V
1 series · 1 of 1 positions shown · non-contrast
Comparison: 09/14/2015

CLINICAL DATA: Lower abdominal pain, constipation, bloating

EXAM:
ABDOMEN - 1 VIEW

[t abdomen supine]
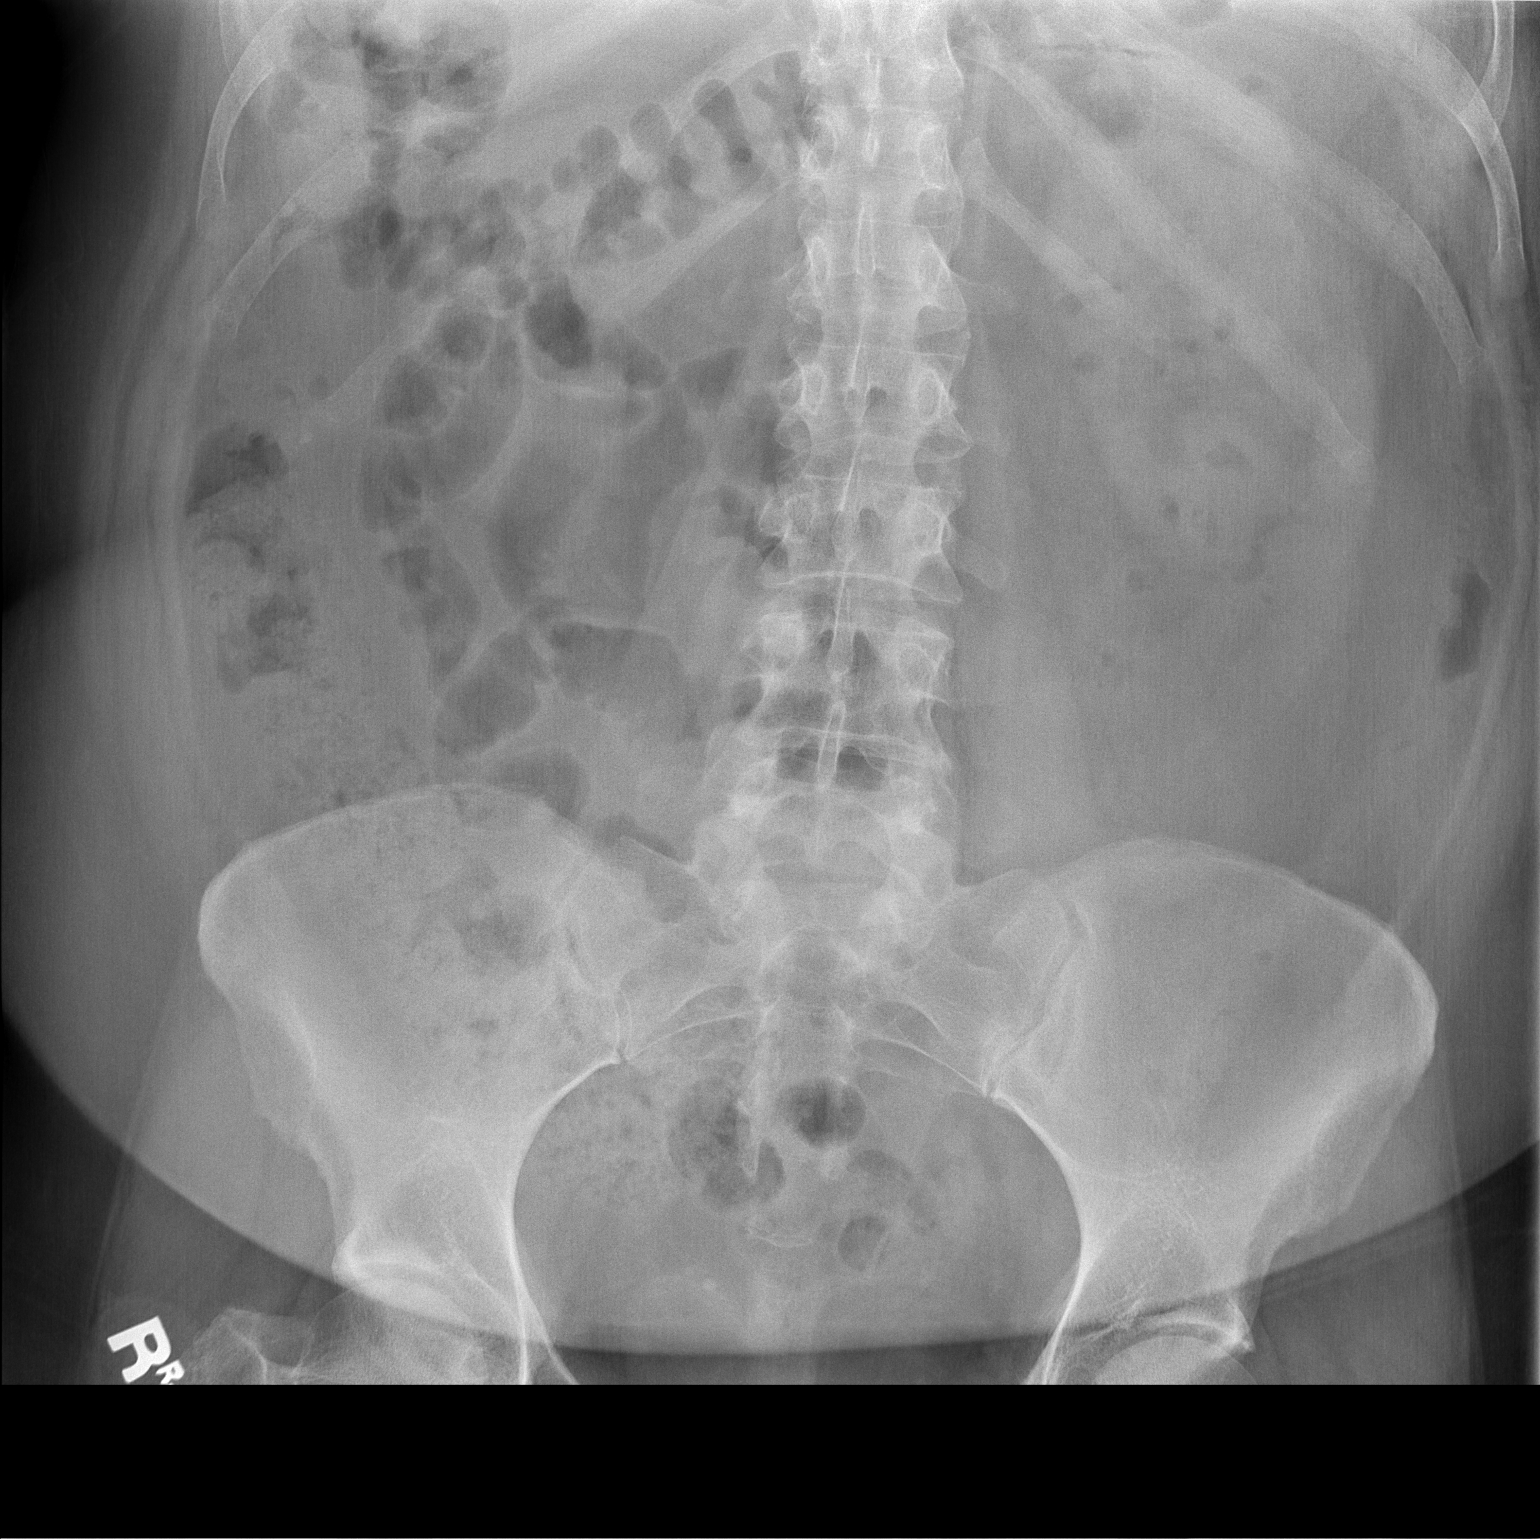

[1 of 1 positions shown; findings below may reference images not displayed]

FINDINGS: Nonobstructive bowel gas pattern. Moderate stool burden in the
colon. No organomegaly or visible free air. No acute bony
abnormality.
IMPRESSION: No acute findings.  Moderate stool burden.

## 2017-04-02 DIAGNOSIS — M25511 Pain in right shoulder: Secondary | ICD-10-CM | POA: Insufficient documentation
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
## 2017-04-02 NOTE — Assessment & Plan Note (Signed)
consistent with rotator cuff impingement.  Shown home exercises to do daily.  Try diclofenac twice a day with food for pain and inflammation - advised not to take ibuprofen with this.  Consider physical therapy, nitro patches, injection if not improving.  F/u in 5-6 weeks.

## 2017-04-04 ENCOUNTER — Telehealth: Payer: Self-pay | Admitting: Family Medicine

## 2017-04-04 DIAGNOSIS — R0600 Dyspnea, unspecified: Secondary | ICD-10-CM | POA: Diagnosis not present

## 2017-04-04 DIAGNOSIS — R1310 Dysphagia, unspecified: Secondary | ICD-10-CM | POA: Diagnosis not present

## 2017-04-04 DIAGNOSIS — J029 Acute pharyngitis, unspecified: Secondary | ICD-10-CM | POA: Diagnosis not present

## 2017-04-04 NOTE — Telephone Encounter (Signed)
Call patient: Jennifer [name] is on the low end of normal. Normal ranges 5 up to 50 and her value was 5. Recommend starting an over-the-counter [name] supplement. Petra Kuba made is a good brand. Vitamin B1 is normal.

## 2017-04-05 ENCOUNTER — Telehealth: Payer: Self-pay

## 2017-04-05 ENCOUNTER — Ambulatory Visit (INDEPENDENT_AMBULATORY_CARE_PROVIDER_SITE_OTHER): Payer: Medicare Other | Admitting: Family Medicine

## 2017-04-05 ENCOUNTER — Encounter: Payer: Self-pay | Admitting: Family Medicine

## 2017-04-05 ENCOUNTER — Ambulatory Visit: Payer: Self-pay | Admitting: Sports Medicine

## 2017-04-05 VITALS — BP 139/85 | HR 92 | Wt 215.0 lb

## 2017-04-05 DIAGNOSIS — J0110 Acute frontal sinusitis, unspecified: Secondary | ICD-10-CM

## 2017-04-05 DIAGNOSIS — E531 Pyridoxine deficiency: Secondary | ICD-10-CM | POA: Insufficient documentation

## 2017-04-05 DIAGNOSIS — T783XXA Angioneurotic edema, initial encounter: Secondary | ICD-10-CM | POA: Diagnosis not present

## 2017-04-05 DIAGNOSIS — J3089 Other allergic rhinitis: Secondary | ICD-10-CM

## 2017-04-05 DIAGNOSIS — I10 Essential (primary) hypertension: Secondary | ICD-10-CM

## 2017-04-05 DIAGNOSIS — I493 Ventricular premature depolarization: Secondary | ICD-10-CM

## 2017-04-05 MED ORDER — DOXYCYCLINE MONOHYDRATE 25 MG/5ML PO SUSR: 100.0000 mg | Freq: Two times a day (BID) | ORAL | 0 refills | Status: DC

## 2017-04-05 MED ORDER — RANITIDINE HCL 150 MG/10ML PO SYRP: 150.0000 mg | ORAL_SOLUTION | Freq: Two times a day (BID) | ORAL | 5 refills | Status: DC

## 2017-04-05 MED ORDER — EPINEPHRINE 0.3 MG/0.3ML IJ SOAJ: 0.3000 mg | INTRAMUSCULAR | 1 refills | Status: DC | PRN

## 2017-04-05 NOTE — Telephone Encounter (Signed)
Richardson Landry from [facility] called and said that patient stated that doxy upsets her stomack really bad.  Last time she had clindamycin 75mg /tsp.  Please advise.
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
## 2017-04-05 NOTE — Telephone Encounter (Signed)
Discussed with pt at [place] today.Jennifer [name] [place]

## 2017-04-06 MED ORDER — CLINDAMYCIN PALMITATE HCL 75 MG/5ML PO SOLR: 150.0000 mg | Freq: Three times a day (TID) | ORAL | 0 refills | Status: DC

## 2017-04-06 NOTE — Telephone Encounter (Signed)
New rx sent to pharamcy

## 2017-04-06 NOTE — Telephone Encounter (Signed)
Pt.notified

## 2017-04-09 ENCOUNTER — Telehealth: Payer: Self-pay

## 2017-04-09 DIAGNOSIS — E876 Hypokalemia: Secondary | ICD-10-CM | POA: Diagnosis not present

## 2017-04-09 LAB — BASIC METABOLIC PANEL
BUN: 12 (ref 4–21)
Creatinine: 0.6 (ref 0.5–1.1)
Glucose: 128
Potassium: 3.8 (ref 3.4–5.3)
Sodium: 144 (ref 137–147)

## 2017-04-09 NOTE — Telephone Encounter (Signed)
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 She said this is the reason for her voice going in and out throat tightness

## 2017-04-09 NOTE — Telephone Encounter (Signed)
OK 

## 2017-04-11 ENCOUNTER — Encounter: Payer: Self-pay | Admitting: Family Medicine

## 2017-04-11 ENCOUNTER — Encounter (HOSPITAL_BASED_OUTPATIENT_CLINIC_OR_DEPARTMENT_OTHER): Payer: Self-pay | Admitting: *Deleted

## 2017-04-11 ENCOUNTER — Emergency Department (HOSPITAL_BASED_OUTPATIENT_CLINIC_OR_DEPARTMENT_OTHER)
Admission: EM | Admit: 2017-04-11 | Discharge: 2017-04-11 | Disposition: A | Discharge status: Home / Self Care | Payer: Medicare Other | Attending: Emergency Medicine | Admitting: Emergency Medicine

## 2017-04-11 DIAGNOSIS — M79604 Pain in right leg: Secondary | ICD-10-CM | POA: Diagnosis not present

## 2017-04-11 DIAGNOSIS — G35 Multiple sclerosis: Secondary | ICD-10-CM | POA: Diagnosis not present

## 2017-04-11 DIAGNOSIS — Z8540 Personal history of malignant neoplasm of unspecified female genital organ: Secondary | ICD-10-CM | POA: Diagnosis not present

## 2017-04-11 DIAGNOSIS — Z87891 Personal history of nicotine dependence: Secondary | ICD-10-CM | POA: Insufficient documentation

## 2017-04-11 DIAGNOSIS — I10 Essential (primary) hypertension: Secondary | ICD-10-CM | POA: Diagnosis not present

## 2017-04-11 DIAGNOSIS — J45909 Unspecified asthma, uncomplicated: Secondary | ICD-10-CM | POA: Diagnosis not present

## 2017-04-11 DIAGNOSIS — Z79899 Other long term (current) drug therapy: Secondary | ICD-10-CM | POA: Insufficient documentation

## 2017-04-11 DIAGNOSIS — R42 Dizziness and giddiness: Secondary | ICD-10-CM | POA: Diagnosis present
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
## 2017-04-11 NOTE — ED Triage Notes (Signed)
Pt c/o dizziness  [place] right leg pain x 2 weeks

## 2017-04-11 NOTE — ED Notes (Signed)
ED Provider at bedside. 

## 2017-04-11 NOTE — ED Notes (Signed)
Pt c/o pain from the bottom of her foot all the way to her groin.  Right back of her knees when she bend her knees.  She also stated that she does not feel good for a week.  Her vision is also blurry.  Her vision was checked a year ago.

## 2017-04-13 ENCOUNTER — Encounter: Payer: Self-pay | Admitting: Family Medicine

## 2017-04-16 DIAGNOSIS — Z886 Allergy status to analgesic agent status: Secondary | ICD-10-CM | POA: Diagnosis not present

## 2017-04-16 DIAGNOSIS — Z880 Allergy status to penicillin: Secondary | ICD-10-CM | POA: Diagnosis not present

## 2017-04-16 DIAGNOSIS — Z9049 Acquired absence of other specified parts of digestive tract: Secondary | ICD-10-CM | POA: Diagnosis not present

## 2017-04-16 DIAGNOSIS — K589 Irritable bowel syndrome without diarrhea: Secondary | ICD-10-CM | POA: Diagnosis not present

## 2017-04-16 DIAGNOSIS — K5900 Constipation, unspecified: Secondary | ICD-10-CM | POA: Diagnosis not present

## 2017-04-16 DIAGNOSIS — Z881 Allergy status to other antibiotic agents status: Secondary | ICD-10-CM | POA: Diagnosis not present

## 2017-04-16 DIAGNOSIS — I10 Essential (primary) hypertension: Secondary | ICD-10-CM | POA: Diagnosis not present

## 2017-04-16 DIAGNOSIS — J45909 Unspecified asthma, uncomplicated: Secondary | ICD-10-CM | POA: Diagnosis not present

## 2017-04-16 DIAGNOSIS — K219 Gastro-esophageal reflux disease without esophagitis: Secondary | ICD-10-CM | POA: Diagnosis not present

## 2017-04-16 DIAGNOSIS — Z882 Allergy status to sulfonamides status: Secondary | ICD-10-CM | POA: Diagnosis not present

## 2017-04-16 DIAGNOSIS — R1084 Generalized abdominal pain: Secondary | ICD-10-CM | POA: Diagnosis not present

## 2017-04-16 DIAGNOSIS — Z87891 Personal history of nicotine dependence: Secondary | ICD-10-CM | POA: Diagnosis not present

## 2017-04-16 DIAGNOSIS — Z79899 Other long term (current) drug therapy: Secondary | ICD-10-CM | POA: Diagnosis not present

## 2017-04-17 ENCOUNTER — Ambulatory Visit: Payer: Self-pay | Admitting: Family Medicine

## 2017-04-18 ENCOUNTER — Encounter: Payer: Self-pay | Admitting: Family Medicine

## 2017-04-18 ENCOUNTER — Emergency Department (HOSPITAL_BASED_OUTPATIENT_CLINIC_OR_DEPARTMENT_OTHER)
Admission: EM | Admit: 2017-04-18 | Discharge: 2017-04-18 | Disposition: A | Discharge status: Home / Self Care | Payer: Medicare Other | Attending: Emergency Medicine | Admitting: Emergency Medicine

## 2017-04-18 ENCOUNTER — Encounter (HOSPITAL_BASED_OUTPATIENT_CLINIC_OR_DEPARTMENT_OTHER): Payer: Self-pay | Admitting: Emergency Medicine

## 2017-04-18 ENCOUNTER — Ambulatory Visit (INDEPENDENT_AMBULATORY_CARE_PROVIDER_SITE_OTHER): Payer: Medicare Other | Admitting: Family Medicine

## 2017-04-18 VITALS — BP 156/81 | HR 81 | Temp 98.7°F | Wt 216.0 lb

## 2017-04-18 DIAGNOSIS — R109 Unspecified abdominal pain: Secondary | ICD-10-CM | POA: Diagnosis not present

## 2017-04-18 DIAGNOSIS — R110 Nausea: Secondary | ICD-10-CM | POA: Insufficient documentation

## 2017-04-18 DIAGNOSIS — G35 Multiple sclerosis: Secondary | ICD-10-CM | POA: Diagnosis not present

## 2017-04-18 DIAGNOSIS — Z9049 Acquired absence of other specified parts of digestive tract: Secondary | ICD-10-CM | POA: Diagnosis not present

## 2017-04-18 DIAGNOSIS — K648 Other hemorrhoids: Secondary | ICD-10-CM | POA: Diagnosis not present

## 2017-04-18 DIAGNOSIS — K5900 Constipation, unspecified: Secondary | ICD-10-CM | POA: Insufficient documentation

## 2017-04-18 DIAGNOSIS — K429 Umbilical hernia without obstruction or gangrene: Secondary | ICD-10-CM | POA: Diagnosis not present

## 2017-04-18 DIAGNOSIS — K5909 Other constipation: Secondary | ICD-10-CM | POA: Diagnosis not present

## 2017-04-18 DIAGNOSIS — Z888 Allergy status to other drugs, medicaments and biological substances status: Secondary | ICD-10-CM | POA: Diagnosis not present

## 2017-04-18 DIAGNOSIS — R140 Abdominal distension (gaseous): Secondary | ICD-10-CM | POA: Diagnosis not present

## 2017-04-18 DIAGNOSIS — I10 Essential (primary) hypertension: Secondary | ICD-10-CM | POA: Diagnosis not present

## 2017-04-18 DIAGNOSIS — K6289 Other specified diseases of anus and rectum: Secondary | ICD-10-CM

## 2017-04-18 DIAGNOSIS — Z880 Allergy status to penicillin: Secondary | ICD-10-CM | POA: Diagnosis not present

## 2017-04-18 DIAGNOSIS — Z91018 Allergy to other foods: Secondary | ICD-10-CM | POA: Diagnosis not present

## 2017-04-18 DIAGNOSIS — Z87891 Personal history of nicotine dependence: Secondary | ICD-10-CM | POA: Diagnosis not present

## 2017-04-18 DIAGNOSIS — R112 Nausea with vomiting, unspecified: Secondary | ICD-10-CM | POA: Diagnosis not present

## 2017-04-18 DIAGNOSIS — Z882 Allergy status to sulfonamides status: Secondary | ICD-10-CM | POA: Diagnosis not present

## 2017-04-18 DIAGNOSIS — Z885 Allergy status to narcotic agent status: Secondary | ICD-10-CM | POA: Diagnosis not present

## 2017-04-18 DIAGNOSIS — Z8589 Personal history of malignant neoplasm of other organs and systems: Secondary | ICD-10-CM | POA: Diagnosis not present

## 2017-04-18 DIAGNOSIS — N289 Disorder of kidney and ureter, unspecified: Secondary | ICD-10-CM | POA: Diagnosis not present

## 2017-04-18 DIAGNOSIS — R000 Tachycardia, unspecified: Secondary | ICD-10-CM | POA: Diagnosis not present

## 2017-04-18 DIAGNOSIS — Z5321 Procedure and treatment not carried out due to patient leaving prior to being seen by health care provider: Secondary | ICD-10-CM | POA: Diagnosis not present

## 2017-04-18 DIAGNOSIS — R569 Unspecified convulsions: Secondary | ICD-10-CM | POA: Diagnosis not present

## 2017-04-18 DIAGNOSIS — Z79899 Other long term (current) drug therapy: Secondary | ICD-10-CM | POA: Diagnosis not present

## 2017-04-18 DIAGNOSIS — Z881 Allergy status to other antibiotic agents status: Secondary | ICD-10-CM | POA: Diagnosis not present

## 2017-04-18 DIAGNOSIS — R1084 Generalized abdominal pain: Secondary | ICD-10-CM | POA: Diagnosis not present

## 2017-04-18 MED ORDER — LIDOCAINE HCL 2 % EX CREA: 1.0000 "application " | TOPICAL_CREAM | Freq: Three times a day (TID) | CUTANEOUS | 99 refills | Status: DC | PRN

## 2017-04-18 MED ORDER — HYDROCORTISONE 2.5 % RE CREA: 1.0000 "application " | TOPICAL_CREAM | Freq: Two times a day (BID) | RECTAL | 3 refills | Status: DC

## 2017-04-18 NOTE — Progress Notes (Signed)
8/15 

## 2017-04-18 NOTE — ED Triage Notes (Addendum)
Patient states that she has not had a bm "in a while" she went to her PMD this am and was checked. The patient reports that she has taken a fleets and magenium and still nothing has passed. Patient states that she is Nauseated and anytime she tries to eat anything she throws up. Patient is crying in pain in triage. Stomach is larger and patient appears to be bloated to this RN. Patient states that zofran will make it worse

## 2017-04-18 NOTE — ED Notes (Signed)
Patient no longer visualized in the waiting room

## 2017-04-18 NOTE — ED Notes (Signed)
Called for pt out in the waiting area, no answer
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
## 2017-04-18 NOTE — Patient Instructions (Signed)
Try to get some water in today. Start a stimulant laxative as soon as she gets home. If you do not have a bowel movement in the next 24 hour and please let me know.

## 2017-04-19 ENCOUNTER — Telehealth: Payer: Self-pay | Admitting: Family Medicine

## 2017-04-19 ENCOUNTER — Telehealth: Payer: Self-pay | Admitting: *Deleted

## 2017-04-19 NOTE — Telephone Encounter (Signed)
Returned patient's call and scheduled an appt for September 19th at 10:45am

## 2017-04-19 NOTE — Telephone Encounter (Signed)
Patient called and states that she would like for Tonya to call her back Today when she gets a chance. Patient is wondering if there is prescription for a enema to help with constipation. Not feeling well.

## 2017-04-20 ENCOUNTER — Other Ambulatory Visit: Payer: Self-pay | Admitting: *Deleted

## 2017-04-20 ENCOUNTER — Encounter: Payer: Self-pay | Admitting: *Deleted

## 2017-04-20 NOTE — Telephone Encounter (Signed)
Called pt back and lvm informing her that I did not know of any medication for edema that would help with constipation also.Maryruth Eve, Lahoma Crocker

## 2017-04-20 NOTE — Patient Outreach (Addendum)
[place] [facility]) Care Management  04/20/2017  Jennifer [name] 1965/10/10 [id number]  RN Health Coach telephone call to patient.  Patient picked up phone and answered and then hung phone up. Plan: RN will send patient a brochure on [facility] services. [place] [facility] Management [phone]

## 2017-04-23 ENCOUNTER — Encounter (HOSPITAL_BASED_OUTPATIENT_CLINIC_OR_DEPARTMENT_OTHER): Payer: Self-pay | Admitting: Emergency Medicine

## 2017-04-23 ENCOUNTER — Emergency Department (HOSPITAL_BASED_OUTPATIENT_CLINIC_OR_DEPARTMENT_OTHER)
Admission: EM | Admit: 2017-04-23 | Discharge: 2017-04-23 | Disposition: A | Discharge status: Home / Self Care | Payer: Medicare Other | Attending: Emergency Medicine | Admitting: Emergency Medicine

## 2017-04-23 DIAGNOSIS — I10 Essential (primary) hypertension: Secondary | ICD-10-CM | POA: Diagnosis not present

## 2017-04-23 DIAGNOSIS — J45909 Unspecified asthma, uncomplicated: Secondary | ICD-10-CM | POA: Diagnosis not present

## 2017-04-23 DIAGNOSIS — Z91010 Allergy to peanuts: Secondary | ICD-10-CM | POA: Diagnosis not present

## 2017-04-23 DIAGNOSIS — Z87891 Personal history of nicotine dependence: Secondary | ICD-10-CM | POA: Diagnosis not present

## 2017-04-23 DIAGNOSIS — R0981 Nasal congestion: Secondary | ICD-10-CM | POA: Diagnosis present

## 2017-04-23 DIAGNOSIS — Z8542 Personal history of malignant neoplasm of other parts of uterus: Secondary | ICD-10-CM | POA: Diagnosis not present

## 2017-04-23 DIAGNOSIS — R079 Chest pain, unspecified: Secondary | ICD-10-CM | POA: Diagnosis not present

## 2017-04-23 DIAGNOSIS — J069 Acute upper respiratory infection, unspecified: Secondary | ICD-10-CM | POA: Insufficient documentation

## 2017-04-23 DIAGNOSIS — Z79899 Other long term (current) drug therapy: Secondary | ICD-10-CM | POA: Insufficient documentation

## 2017-04-23 NOTE — ED Triage Notes (Signed)
Pt states she saw her md last week. And was given antibiotics to take if her sinuses started to give her problems.  Pt has been on them for 2 1/2 days.  Pt now is having some pain in her ribs with deep breaths.  Pt denies fever.  Pt states she is constipated and feels bloated.

## 2017-04-23 NOTE — Discharge Instructions (Signed)
Please read attached information. If you experience any new or worsening signs or symptoms please return to the emergency room for evaluation. Please follow-up with your primary care provider or specialist as discussed.  °
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
## 2017-04-24 DIAGNOSIS — R0602 Shortness of breath: Secondary | ICD-10-CM | POA: Diagnosis not present

## 2017-04-24 DIAGNOSIS — J3089 Other allergic rhinitis: Secondary | ICD-10-CM | POA: Diagnosis not present

## 2017-04-24 DIAGNOSIS — J069 Acute upper respiratory infection, unspecified: Secondary | ICD-10-CM | POA: Diagnosis not present

## 2017-04-24 DIAGNOSIS — R079 Chest pain, unspecified: Secondary | ICD-10-CM | POA: Diagnosis not present

## 2017-04-24 DIAGNOSIS — R05 Cough: Secondary | ICD-10-CM | POA: Diagnosis not present

## 2017-04-24 DIAGNOSIS — J302 Other seasonal allergic rhinitis: Secondary | ICD-10-CM | POA: Diagnosis not present

## 2017-04-24 DIAGNOSIS — R51 Headache: Secondary | ICD-10-CM | POA: Diagnosis not present

## 2017-04-24 DIAGNOSIS — R030 Elevated blood-pressure reading, without diagnosis of hypertension: Secondary | ICD-10-CM | POA: Diagnosis not present

## 2017-04-26 ENCOUNTER — Ambulatory Visit (INDEPENDENT_AMBULATORY_CARE_PROVIDER_SITE_OTHER): Payer: Medicare Other | Admitting: Family Medicine

## 2017-04-26 ENCOUNTER — Encounter: Payer: Self-pay | Admitting: Family Medicine

## 2017-04-26 VITALS — BP 142/75 | HR 87 | Temp 99.1°F | Ht 62.0 in | Wt 217.0 lb

## 2017-04-26 DIAGNOSIS — F430 Acute stress reaction: Secondary | ICD-10-CM

## 2017-04-26 DIAGNOSIS — Z882 Allergy status to sulfonamides status: Secondary | ICD-10-CM | POA: Diagnosis not present

## 2017-04-26 DIAGNOSIS — E785 Hyperlipidemia, unspecified: Secondary | ICD-10-CM | POA: Diagnosis not present

## 2017-04-26 DIAGNOSIS — R0602 Shortness of breath: Secondary | ICD-10-CM | POA: Diagnosis not present

## 2017-04-26 DIAGNOSIS — Z880 Allergy status to penicillin: Secondary | ICD-10-CM | POA: Diagnosis not present

## 2017-04-26 DIAGNOSIS — K5909 Other constipation: Secondary | ICD-10-CM

## 2017-04-26 DIAGNOSIS — Z888 Allergy status to other drugs, medicaments and biological substances status: Secondary | ICD-10-CM | POA: Diagnosis not present

## 2017-04-26 DIAGNOSIS — I10 Essential (primary) hypertension: Secondary | ICD-10-CM | POA: Diagnosis not present

## 2017-04-26 DIAGNOSIS — Z885 Allergy status to narcotic agent status: Secondary | ICD-10-CM | POA: Diagnosis not present

## 2017-04-26 DIAGNOSIS — J069 Acute upper respiratory infection, unspecified: Secondary | ICD-10-CM | POA: Diagnosis not present

## 2017-04-26 DIAGNOSIS — G35 Multiple sclerosis: Secondary | ICD-10-CM | POA: Diagnosis not present

## 2017-04-26 DIAGNOSIS — Z881 Allergy status to other antibiotic agents status: Secondary | ICD-10-CM | POA: Diagnosis not present

## 2017-04-26 DIAGNOSIS — Z87891 Personal history of nicotine dependence: Secondary | ICD-10-CM | POA: Diagnosis not present

## 2017-04-26 DIAGNOSIS — R635 Abnormal weight gain: Secondary | ICD-10-CM

## 2017-04-26 DIAGNOSIS — Z79899 Other long term (current) drug therapy: Secondary | ICD-10-CM | POA: Diagnosis not present

## 2017-04-26 DIAGNOSIS — K219 Gastro-esophageal reflux disease without esophagitis: Secondary | ICD-10-CM | POA: Diagnosis not present

## 2017-04-26 DIAGNOSIS — D509 Iron deficiency anemia, unspecified: Secondary | ICD-10-CM | POA: Diagnosis not present

## 2017-04-26 DIAGNOSIS — Z91018 Allergy to other foods: Secondary | ICD-10-CM | POA: Diagnosis not present

## 2017-04-26 MED ORDER — DEXAMETHASONE SODIUM PHOSPHATE 4 MG/ML IJ SOLN
2.0000 mg | Freq: Once | INTRAMUSCULAR | Status: AC
  Administered 2017-04-26: 2 mg via INTRAMUSCULAR

## 2017-04-26 MED ORDER — HYDROCODONE-HOMATROPINE 5-1.5 MG/5ML PO SYRP: 5.0000 mL | ORAL_SOLUTION | Freq: Four times a day (QID) | ORAL | 0 refills | Status: DC | PRN
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
## 2017-04-26 NOTE — Patient Instructions (Signed)
Take a stool softener with each meal Ok ot take the Ex-Lax at bedtime, 2 tabs.

## 2017-04-30 ENCOUNTER — Telehealth: Payer: Self-pay | Admitting: *Deleted

## 2017-04-30 DIAGNOSIS — I10 Essential (primary) hypertension: Secondary | ICD-10-CM | POA: Diagnosis not present

## 2017-04-30 DIAGNOSIS — Z91018 Allergy to other foods: Secondary | ICD-10-CM | POA: Diagnosis not present

## 2017-04-30 DIAGNOSIS — J069 Acute upper respiratory infection, unspecified: Secondary | ICD-10-CM | POA: Diagnosis not present

## 2017-04-30 DIAGNOSIS — Z881 Allergy status to other antibiotic agents status: Secondary | ICD-10-CM | POA: Diagnosis not present

## 2017-04-30 DIAGNOSIS — Z882 Allergy status to sulfonamides status: Secondary | ICD-10-CM | POA: Diagnosis not present

## 2017-04-30 DIAGNOSIS — Z79899 Other long term (current) drug therapy: Secondary | ICD-10-CM | POA: Diagnosis not present

## 2017-04-30 DIAGNOSIS — G35 Multiple sclerosis: Secondary | ICD-10-CM | POA: Diagnosis not present

## 2017-04-30 DIAGNOSIS — Z880 Allergy status to penicillin: Secondary | ICD-10-CM | POA: Diagnosis not present

## 2017-04-30 DIAGNOSIS — R002 Palpitations: Secondary | ICD-10-CM | POA: Diagnosis not present

## 2017-04-30 DIAGNOSIS — Z87891 Personal history of nicotine dependence: Secondary | ICD-10-CM | POA: Diagnosis not present

## 2017-04-30 DIAGNOSIS — Z8589 Personal history of malignant neoplasm of other organs and systems: Secondary | ICD-10-CM | POA: Diagnosis not present

## 2017-04-30 DIAGNOSIS — J040 Acute laryngitis: Secondary | ICD-10-CM | POA: Diagnosis not present

## 2017-04-30 DIAGNOSIS — Z888 Allergy status to other drugs, medicaments and biological substances status: Secondary | ICD-10-CM | POA: Diagnosis not present

## 2017-04-30 DIAGNOSIS — J45909 Unspecified asthma, uncomplicated: Secondary | ICD-10-CM | POA: Diagnosis not present

## 2017-04-30 DIAGNOSIS — R0602 Shortness of breath: Secondary | ICD-10-CM | POA: Diagnosis not present

## 2017-04-30 DIAGNOSIS — Z885 Allergy status to narcotic agent status: Secondary | ICD-10-CM | POA: Diagnosis not present

## 2017-04-30 DIAGNOSIS — R0600 Dyspnea, unspecified: Secondary | ICD-10-CM | POA: Diagnosis not present

## 2017-04-30 DIAGNOSIS — R569 Unspecified convulsions: Secondary | ICD-10-CM | POA: Diagnosis not present

## 2017-04-30 NOTE — Telephone Encounter (Signed)
Attempted to return patients call regarding the need to cancel an appt for September 19th, no answer. Appt canceled and LMOM to call the office to reschedule

## 2017-05-02 ENCOUNTER — Ambulatory Visit: Payer: Self-pay | Admitting: Gynecologic Oncology

## 2017-05-02 ENCOUNTER — Telehealth: Payer: Self-pay | Admitting: *Deleted

## 2017-05-02 NOTE — Telephone Encounter (Addendum)
Patient called and rescheduled her appt from today to November. Patient moved appt due to being sick. Patient asked "How long to I have to still come to be checked. I just want this finished. I travel to far and have to many health issues. Can I justed wait and come on the last appt I need to be seen." Explained to the patient that "we normal monitor patients up to five years then release you." Patient then asked "I need a Friday afternoon appt around 3pm."Patient was very adament about the date and time. Explained to the patient " It is your choose to come on scheduled or to wait until the last appt in the surveillance time." Patient decided to take the appt for November.

## 2017-05-03 ENCOUNTER — Ambulatory Visit: Payer: Self-pay | Admitting: Family Medicine

## 2017-05-03 ENCOUNTER — Encounter (HOSPITAL_BASED_OUTPATIENT_CLINIC_OR_DEPARTMENT_OTHER): Payer: Self-pay | Admitting: Emergency Medicine

## 2017-05-03 ENCOUNTER — Emergency Department (HOSPITAL_BASED_OUTPATIENT_CLINIC_OR_DEPARTMENT_OTHER)
Admission: EM | Admit: 2017-05-03 | Discharge: 2017-05-03 | Disposition: A | Discharge status: Home / Self Care | Payer: Medicare Other | Attending: Emergency Medicine | Admitting: Emergency Medicine

## 2017-05-03 ENCOUNTER — Emergency Department (HOSPITAL_BASED_OUTPATIENT_CLINIC_OR_DEPARTMENT_OTHER): Payer: Medicare Other

## 2017-05-03 ENCOUNTER — Telehealth: Payer: Self-pay | Admitting: Family Medicine

## 2017-05-03 DIAGNOSIS — Z79899 Other long term (current) drug therapy: Secondary | ICD-10-CM | POA: Diagnosis not present

## 2017-05-03 DIAGNOSIS — R109 Unspecified abdominal pain: Secondary | ICD-10-CM

## 2017-05-03 DIAGNOSIS — Z87891 Personal history of nicotine dependence: Secondary | ICD-10-CM | POA: Insufficient documentation

## 2017-05-03 DIAGNOSIS — R140 Abdominal distension (gaseous): Secondary | ICD-10-CM | POA: Diagnosis not present

## 2017-05-03 DIAGNOSIS — R0602 Shortness of breath: Secondary | ICD-10-CM | POA: Diagnosis not present

## 2017-05-03 DIAGNOSIS — R112 Nausea with vomiting, unspecified: Secondary | ICD-10-CM | POA: Diagnosis not present

## 2017-05-03 DIAGNOSIS — I10 Essential (primary) hypertension: Secondary | ICD-10-CM | POA: Insufficient documentation

## 2017-05-03 DIAGNOSIS — R1084 Generalized abdominal pain: Secondary | ICD-10-CM | POA: Diagnosis not present

## 2017-05-03 DIAGNOSIS — K5900 Constipation, unspecified: Secondary | ICD-10-CM | POA: Diagnosis not present

## 2017-05-03 DIAGNOSIS — Z5321 Procedure and treatment not carried out due to patient leaving prior to being seen by health care provider: Secondary | ICD-10-CM | POA: Diagnosis not present

## 2017-05-03 DIAGNOSIS — R05 Cough: Secondary | ICD-10-CM | POA: Diagnosis not present

## 2017-05-03 MED ORDER — DICYCLOMINE HCL 10 MG PO CAPS
10.0000 mg | ORAL_CAPSULE | Freq: Once | ORAL | Status: AC
  Administered 2017-05-03: 10 mg via ORAL
  Filled 2017-05-03: qty 1

## 2017-05-03 MED ORDER — DICYCLOMINE HCL 20 MG PO TABS: 20.0000 mg | ORAL_TABLET | Freq: Two times a day (BID) | ORAL | 0 refills | Status: DC

## 2017-05-03 NOTE — ED Notes (Signed)
Patient had a complete work up at [facility]

## 2017-05-03 NOTE — ED Notes (Signed)
ED Provider at bedside. 

## 2017-05-03 NOTE — Discharge Instructions (Signed)
Continue taking the Lactulose as directed.   You can take the Docusate Sodium up to 4 times a day. Do not exceed 300 mg a day.   You can continue using enemas as directed.  Make sure he didn't need any of fluids and staying hydrated.  You can take Bentyl for the pain.   Follow-up with her primary care doctor in the next 24-48 hours for further evaluation.  Return the emergency Department for worsening abdominal pain, constipation, blood in her stools, chest pain, difficult breathing or any other worsening or concerning symptoms.

## 2017-05-03 NOTE — ED Notes (Signed)
MD given report of the patients behavior and wishes. MD rees gave permission to put in for an Abdominal x-ray
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
## 2017-05-03 NOTE — Telephone Encounter (Signed)
Patient called canceled her appointment advised that she is going to the emergency department her stomach is very tight and hurting really bad and every time she eats she vomits. Pt request to know if she can be seen next Monday or Tuesday possibly adv pt will send a phone message. Thanks

## 2017-05-03 NOTE — ED Triage Notes (Addendum)
Patient states that she is having generalized abdominal pain from her epigastric region to her right lower quad region. The patient reports that she has Mucous that she is coughing up and taking stool softer and is having intermittent Bowel movements. Patient reports that she is very "tight" to her abdominal area. Patient is tearful and very upsets in triage and acting "abnormal" from her usual behavior. The patient is usually calm and cooperative, patient states that she is really not feeling well today and "You know me I usually don't get this upset"  This RN noted a change in the patients demeanor and actions while triage and noted that they are not her "usual"

## 2017-05-03 NOTE — ED Notes (Signed)
Pt just had blood drawn at [facility] earlier today; results in chart. PA-C notified. No redraw needed.

## 2017-05-04 ENCOUNTER — Ambulatory Visit: Payer: Medicare Other | Admitting: Family Medicine

## 2017-05-04 NOTE — Telephone Encounter (Signed)
w can put her in a Tuesday slot

## 2017-05-05 DIAGNOSIS — Z886 Allergy status to analgesic agent status: Secondary | ICD-10-CM | POA: Diagnosis not present

## 2017-05-05 DIAGNOSIS — K5900 Constipation, unspecified: Secondary | ICD-10-CM | POA: Diagnosis not present

## 2017-05-05 DIAGNOSIS — Z882 Allergy status to sulfonamides status: Secondary | ICD-10-CM | POA: Diagnosis not present

## 2017-05-05 DIAGNOSIS — Z888 Allergy status to other drugs, medicaments and biological substances status: Secondary | ICD-10-CM | POA: Diagnosis not present

## 2017-05-05 DIAGNOSIS — M791 Myalgia: Secondary | ICD-10-CM | POA: Diagnosis not present

## 2017-05-05 DIAGNOSIS — R9431 Abnormal electrocardiogram [ECG] [EKG]: Secondary | ICD-10-CM | POA: Diagnosis not present

## 2017-05-05 DIAGNOSIS — Z884 Allergy status to anesthetic agent status: Secondary | ICD-10-CM | POA: Diagnosis not present

## 2017-05-05 DIAGNOSIS — Z885 Allergy status to narcotic agent status: Secondary | ICD-10-CM | POA: Diagnosis not present

## 2017-05-05 DIAGNOSIS — M7989 Other specified soft tissue disorders: Secondary | ICD-10-CM | POA: Diagnosis not present

## 2017-05-05 DIAGNOSIS — Z79899 Other long term (current) drug therapy: Secondary | ICD-10-CM | POA: Diagnosis not present

## 2017-05-05 DIAGNOSIS — Z880 Allergy status to penicillin: Secondary | ICD-10-CM | POA: Diagnosis not present

## 2017-05-05 DIAGNOSIS — Z881 Allergy status to other antibiotic agents status: Secondary | ICD-10-CM | POA: Diagnosis not present

## 2017-05-05 DIAGNOSIS — I10 Essential (primary) hypertension: Secondary | ICD-10-CM | POA: Diagnosis not present

## 2017-05-05 DIAGNOSIS — J45909 Unspecified asthma, uncomplicated: Secondary | ICD-10-CM | POA: Diagnosis not present

## 2017-05-05 DIAGNOSIS — R600 Localized edema: Secondary | ICD-10-CM | POA: Diagnosis not present

## 2017-05-06 IMAGING — CR DG CHEST 2V
2 series · 2 of 2 positions shown · non-contrast
Comparison: Radiograph January 26, 2016.

CLINICAL DATA: Shortness of breath.

EXAM:
CHEST  2 VIEW

[w chest pa]
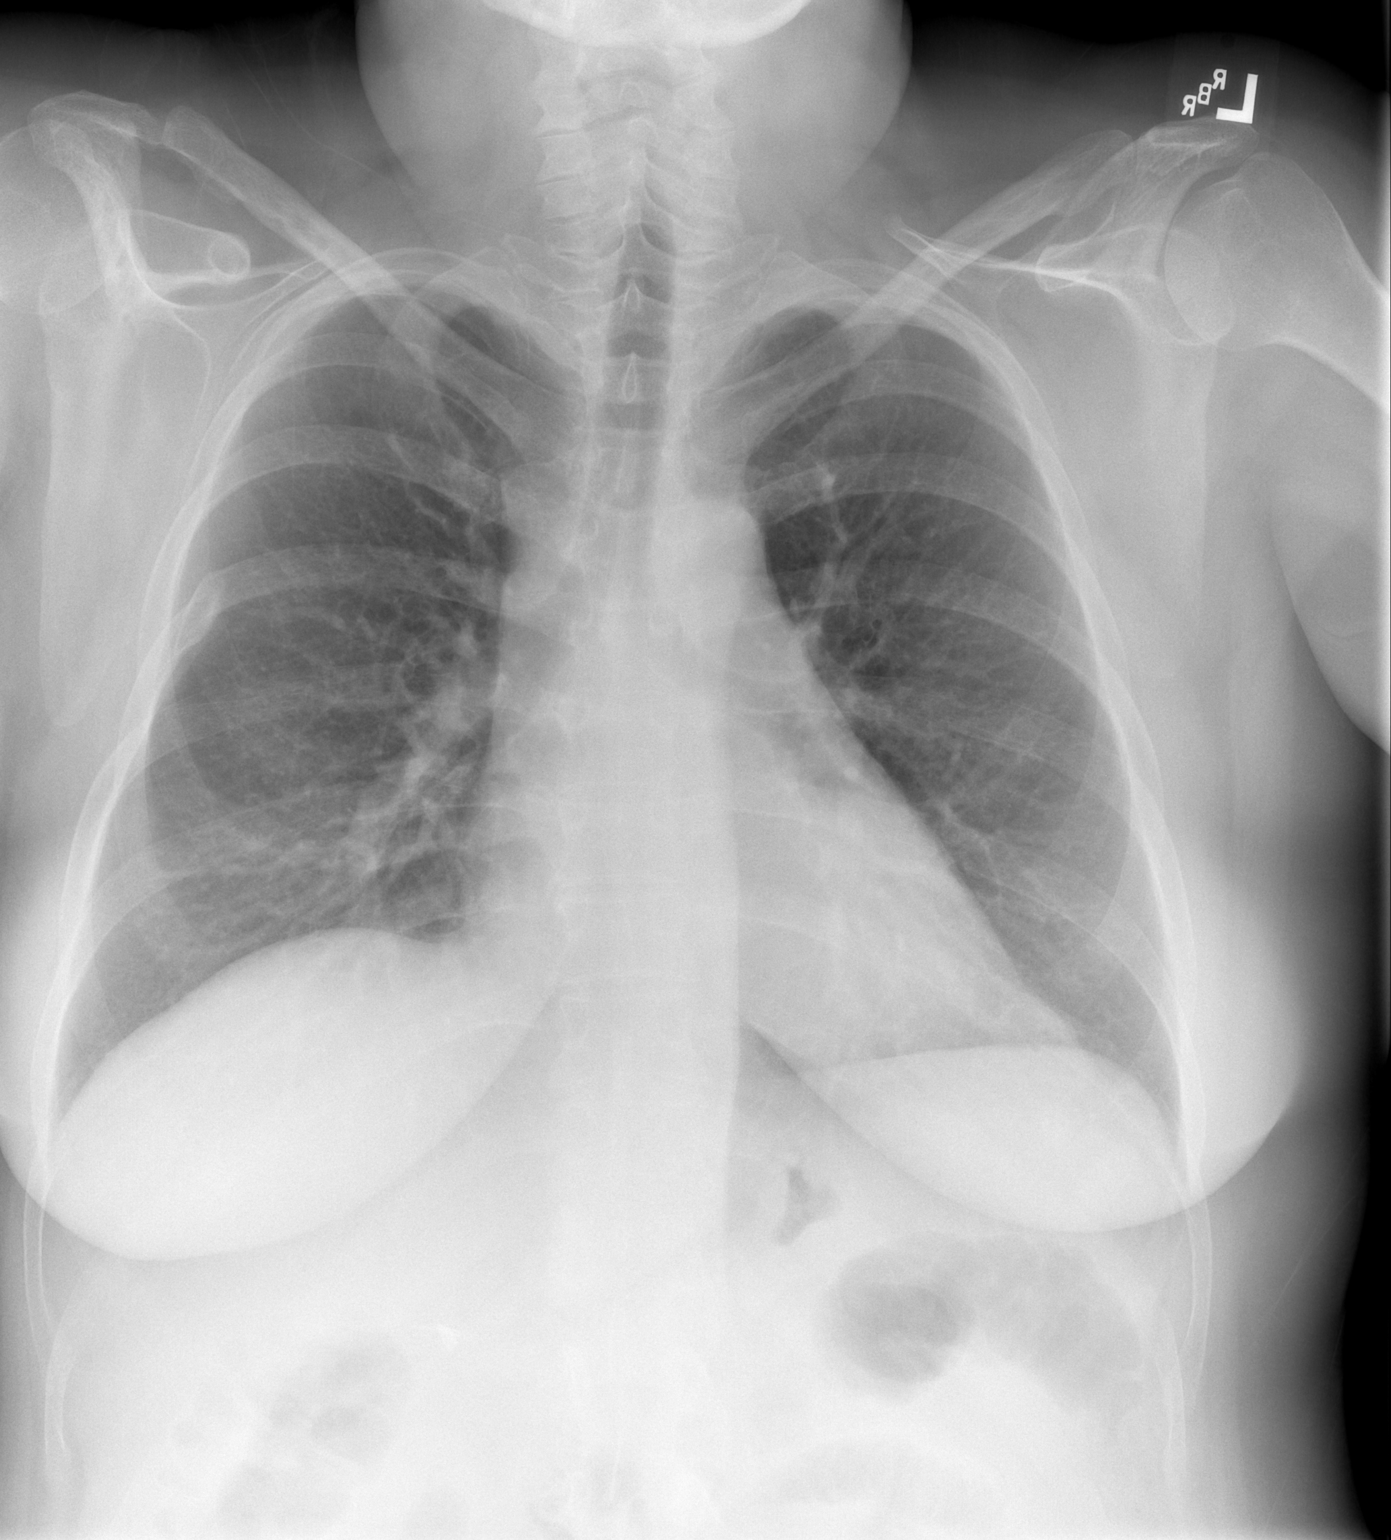

[w chest lat]
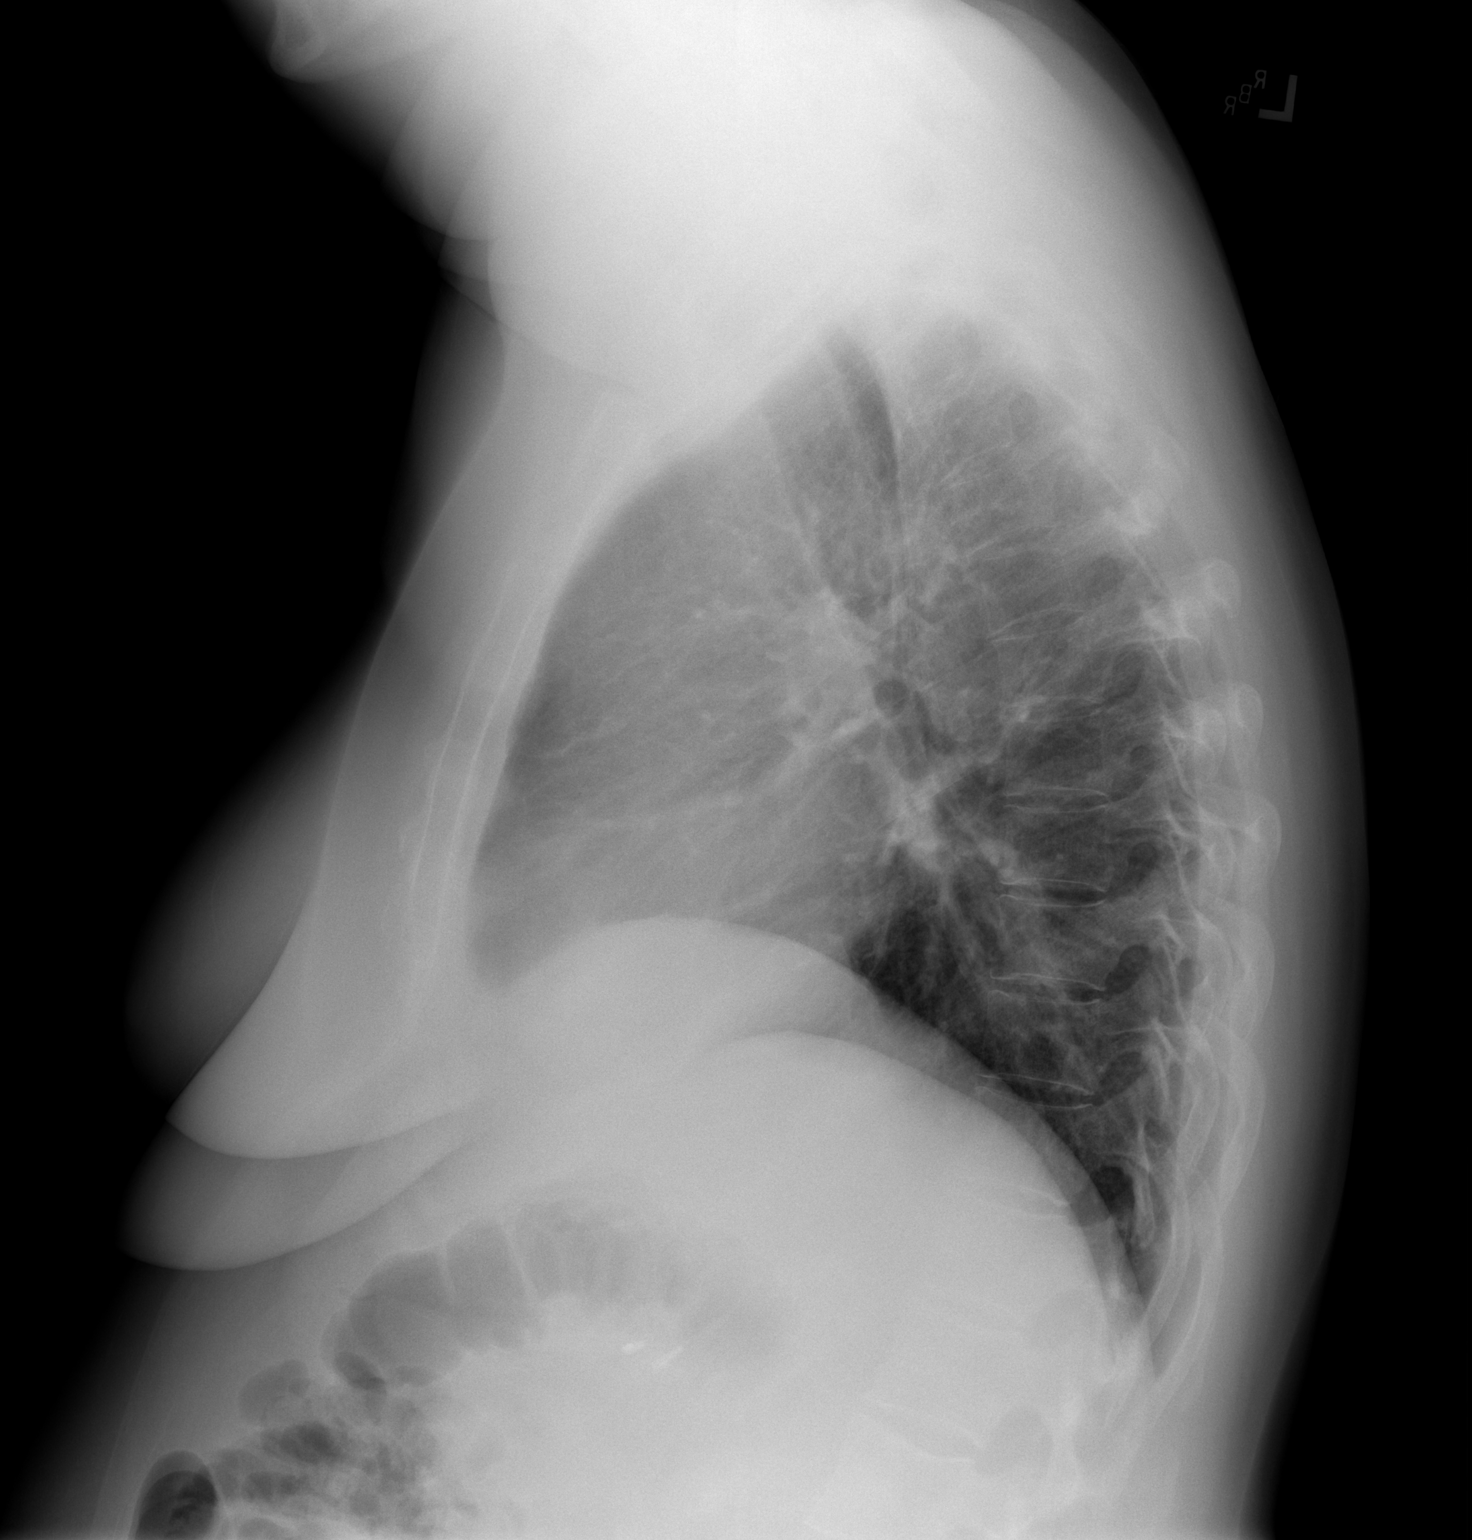

[2 of 2 positions shown; findings below may reference images not displayed]

FINDINGS: The heart size and mediastinal contours are within normal limits.
Both lungs are clear. No pneumothorax or pleural effusion is noted.
Right rib fracture is again noted.
IMPRESSION: No active cardiopulmonary disease.

## 2017-05-07 ENCOUNTER — Telehealth: Payer: Self-pay | Admitting: Family Medicine

## 2017-05-07 ENCOUNTER — Ambulatory Visit: Payer: Self-pay | Admitting: Family Medicine

## 2017-05-07 NOTE — Telephone Encounter (Signed)
Patient is requesting that Lanagan call her back

## 2017-05-08 NOTE — Telephone Encounter (Signed)
Pt reports that her fingers, feet(painful when she walks) and legs are swollen and they gave her 5 tablets of spironolactone. She reports that she is unable to close her hands. They are puffy and she is in a lot of pain both of her legs are swelling she feels that her body is turning against her. She stated that she thought when she spoke w/Jennifer yesterday that she made an appt for her tomorrow however, she doesn't have an appt until next Wednesday. She would like to know if she can be seen? Will fwd to pcp.[name] [place]

## 2017-05-09 NOTE — Telephone Encounter (Signed)
Elevated extremities to help with the fluids. Make sure eating extremely clear low-salt diet because any extra salt will make her retain fluid even more. Continue to take the spironolactone daily. Keep water intake at 60 ounces per day.

## 2017-05-09 NOTE — Telephone Encounter (Signed)
We can repeat these labs if she would like.  I think we last ordered about a year ago.

## 2017-05-09 NOTE — Telephone Encounter (Signed)
Pt has appt scheduled.Jennifer [name] [place]

## 2017-05-09 NOTE — Telephone Encounter (Signed)
Pt asked about doing a repeat aldosterone and renin lab could this have something to do with her retaining fluid? Pt was advised of Dr. Gardiner Ramus recommendations. Will fwd this back to pcp .Jennifer [name] [place]

## 2017-05-11 DIAGNOSIS — R1010 Upper abdominal pain, unspecified: Secondary | ICD-10-CM | POA: Diagnosis not present

## 2017-05-11 DIAGNOSIS — Z882 Allergy status to sulfonamides status: Secondary | ICD-10-CM | POA: Diagnosis not present

## 2017-05-11 DIAGNOSIS — Z8679 Personal history of other diseases of the circulatory system: Secondary | ICD-10-CM | POA: Diagnosis not present

## 2017-05-11 DIAGNOSIS — J45909 Unspecified asthma, uncomplicated: Secondary | ICD-10-CM | POA: Diagnosis not present

## 2017-05-11 DIAGNOSIS — R079 Chest pain, unspecified: Secondary | ICD-10-CM | POA: Diagnosis not present

## 2017-05-11 DIAGNOSIS — Z880 Allergy status to penicillin: Secondary | ICD-10-CM | POA: Diagnosis not present

## 2017-05-11 DIAGNOSIS — I10 Essential (primary) hypertension: Secondary | ICD-10-CM | POA: Diagnosis not present

## 2017-05-11 DIAGNOSIS — Z9049 Acquired absence of other specified parts of digestive tract: Secondary | ICD-10-CM | POA: Diagnosis not present

## 2017-05-11 DIAGNOSIS — Z884 Allergy status to anesthetic agent status: Secondary | ICD-10-CM | POA: Diagnosis not present

## 2017-05-11 DIAGNOSIS — R61 Generalized hyperhidrosis: Secondary | ICD-10-CM | POA: Diagnosis not present

## 2017-05-11 DIAGNOSIS — Z881 Allergy status to other antibiotic agents status: Secondary | ICD-10-CM | POA: Diagnosis not present

## 2017-05-11 DIAGNOSIS — Z888 Allergy status to other drugs, medicaments and biological substances status: Secondary | ICD-10-CM | POA: Diagnosis not present

## 2017-05-11 DIAGNOSIS — K219 Gastro-esophageal reflux disease without esophagitis: Secondary | ICD-10-CM | POA: Diagnosis not present

## 2017-05-11 DIAGNOSIS — Z8719 Personal history of other diseases of the digestive system: Secondary | ICD-10-CM | POA: Diagnosis not present

## 2017-05-11 DIAGNOSIS — Z886 Allergy status to analgesic agent status: Secondary | ICD-10-CM | POA: Diagnosis not present

## 2017-05-11 DIAGNOSIS — R9431 Abnormal electrocardiogram [ECG] [EKG]: Secondary | ICD-10-CM | POA: Diagnosis not present

## 2017-05-11 DIAGNOSIS — Z87891 Personal history of nicotine dependence: Secondary | ICD-10-CM | POA: Diagnosis not present

## 2017-05-11 DIAGNOSIS — Z885 Allergy status to narcotic agent status: Secondary | ICD-10-CM | POA: Diagnosis not present

## 2017-05-11 DIAGNOSIS — M7989 Other specified soft tissue disorders: Secondary | ICD-10-CM | POA: Diagnosis not present

## 2017-05-12 DIAGNOSIS — R9431 Abnormal electrocardiogram [ECG] [EKG]: Secondary | ICD-10-CM | POA: Diagnosis not present

## 2017-05-15 ENCOUNTER — Emergency Department (HOSPITAL_BASED_OUTPATIENT_CLINIC_OR_DEPARTMENT_OTHER)
Admission: EM | Admit: 2017-05-15 | Discharge: 2017-05-15 | Disposition: A | Discharge status: Home / Self Care | Payer: Medicare Other | Attending: Emergency Medicine | Admitting: Emergency Medicine

## 2017-05-15 DIAGNOSIS — M79605 Pain in left leg: Secondary | ICD-10-CM | POA: Insufficient documentation

## 2017-05-15 DIAGNOSIS — R002 Palpitations: Secondary | ICD-10-CM | POA: Diagnosis not present

## 2017-05-15 DIAGNOSIS — I10 Essential (primary) hypertension: Secondary | ICD-10-CM | POA: Diagnosis not present

## 2017-05-15 DIAGNOSIS — J45909 Unspecified asthma, uncomplicated: Secondary | ICD-10-CM | POA: Insufficient documentation

## 2017-05-15 DIAGNOSIS — Z8542 Personal history of malignant neoplasm of other parts of uterus: Secondary | ICD-10-CM | POA: Insufficient documentation

## 2017-05-15 DIAGNOSIS — Z87891 Personal history of nicotine dependence: Secondary | ICD-10-CM | POA: Insufficient documentation

## 2017-05-15 DIAGNOSIS — Z8541 Personal history of malignant neoplasm of cervix uteri: Secondary | ICD-10-CM | POA: Insufficient documentation

## 2017-05-15 DIAGNOSIS — Z91010 Allergy to peanuts: Secondary | ICD-10-CM | POA: Insufficient documentation

## 2017-05-15 DIAGNOSIS — M79604 Pain in right leg: Secondary | ICD-10-CM | POA: Diagnosis not present

## 2017-05-15 DIAGNOSIS — Z79899 Other long term (current) drug therapy: Secondary | ICD-10-CM | POA: Insufficient documentation

## 2017-05-15 LAB — BASIC METABOLIC PANEL
Anion gap: 7 (ref 5–15)
BUN: 12 mg/dL (ref 6–20)
CO2: 24 mmol/L (ref 22–32)
Calcium: 8.9 mg/dL (ref 8.9–10.3)
Chloride: 108 mmol/L (ref 101–111)
Creatinine, Ser: 0.61 mg/dL (ref 0.44–1.00)
GFR calc Af Amer: >60 mL/min (ref >60)
GFR calc non Af Amer: >60 mL/min (ref >60)
Glucose, Bld: 95 mg/dL (ref 65–99)
Potassium: 3.7 mmol/L (ref 3.5–5.1)
Sodium: 139 mmol/L (ref 135–145)

## 2017-05-15 NOTE — Telephone Encounter (Signed)
Patient has a follow up appointment tomorrow. 
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
## 2017-05-15 NOTE — ED Triage Notes (Signed)
Pt reports bilateral leg pain, mainly on left side. Pt was seen twice at [facility] recently for same issue; could not get in to see PCP today.

## 2017-05-15 NOTE — Discharge Instructions (Signed)
Today's lab and cardiac monitoring without any acute findings. Keep your appointment with your primary care doctor scheduled for tomorrow. Return for any new or worse symptoms.

## 2017-05-15 NOTE — ED Notes (Signed)
ED Provider at bedside. 

## 2017-05-16 ENCOUNTER — Other Ambulatory Visit: Payer: Self-pay | Admitting: *Deleted

## 2017-05-16 ENCOUNTER — Ambulatory Visit (INDEPENDENT_AMBULATORY_CARE_PROVIDER_SITE_OTHER): Payer: Medicare Other | Admitting: Family Medicine

## 2017-05-16 ENCOUNTER — Encounter: Payer: Self-pay | Admitting: Family Medicine

## 2017-05-16 ENCOUNTER — Telehealth: Payer: Self-pay | Admitting: *Deleted

## 2017-05-16 VITALS — BP 137/82 | HR 85 | Ht 62.0 in | Wt 215.0 lb

## 2017-05-16 DIAGNOSIS — I10 Essential (primary) hypertension: Secondary | ICD-10-CM

## 2017-05-16 DIAGNOSIS — Z91048 Other nonmedicinal substance allergy status: Secondary | ICD-10-CM | POA: Diagnosis not present

## 2017-05-16 DIAGNOSIS — E876 Hypokalemia: Secondary | ICD-10-CM | POA: Diagnosis not present

## 2017-05-16 DIAGNOSIS — R0789 Other chest pain: Secondary | ICD-10-CM

## 2017-05-16 DIAGNOSIS — R21 Rash and other nonspecific skin eruption: Secondary | ICD-10-CM

## 2017-05-16 DIAGNOSIS — E611 Iron deficiency: Secondary | ICD-10-CM | POA: Diagnosis not present

## 2017-05-16 MED ORDER — SPIRONOLACTONE 25 MG PO TABS: 12.5000 mg | ORAL_TABLET | Freq: Every day | ORAL | 1 refills | Status: DC

## 2017-05-16 MED ORDER — LACTULOSE 10 GM/15ML PO SOLN: 20.0000 g | Freq: Three times a day (TID) | ORAL | 3 refills | Status: DC | PRN

## 2017-05-16 MED ORDER — TERBINAFINE HCL 1 % EX CREA: 1.0000 "application " | TOPICAL_CREAM | Freq: Two times a day (BID) | CUTANEOUS | 0 refills | Status: DC

## 2017-05-16 NOTE — Telephone Encounter (Signed)
Lab order faxed to [facility] for pt to have labs done today.Jennifer [name] [place]

## 2017-05-16 NOTE — Patient Instructions (Addendum)
Follow up one week.  Stop the steroid cream.

## 2017-05-16 NOTE — Progress Notes (Signed)
Subjective:    CC: Leg pain   HPI:  She had pain in her left calf and went to [facility] and had a negative doppler.  Still having pain and swelling and having throbbing in her left foot.  She feels like her entire legs including her thighs or swelling. About a week and a half to 2 weeks ago she started noticing that her inner thighs were rubbing together and then started to develop pain in both lower extremities. Right now it's painful the bottoms of both feet and painful to even walk. She was given spironolactone in the emergency department and took it for 5 days. Her weight is down 2 pounds and she did feel like her swelling was improving. She ran out of the medication about 2 days ago and feels like she starting to swell again.  She also has a rash on her foot. She has been using an old topical steroid cream.    She did move into a new apartment is still worried about potential for mold exposure. Tinel's a little crusty in her new apartment.  Past medical history, Surgical history, Family history not pertinant except as noted below, Social history, Allergies, and medications have been entered into the medical record, reviewed, and corrections made.   Review of Systems: + excessive sweating.   Objective:    General: Well Developed, well nourished, and in no acute distress.  Neuro: Alert and oriented x3, extra-ocular muscles intact, sensation grossly intact.  HEENT: Normocephalic, atraumatic  Skin: Warm and dry, no rashes. Cardiac: Regular rate and rhythm, no murmurs rubs or gallops, no lower extremity edema.  Respiratory: Clear to auscultation bilaterally. Not using accessory muscles, speaking in full sentences.   Impression and Recommendations:    Left calf pain - Doppler was negative. No sign of blood clot.  Unclear etiology. May be related to her swelling. Will try to improve that with the diuretic.  Avoid excess salt intake.     Bilateral LE swelling-we'll restart spironlactone.  If she responds to half tab, then  okay to continue this.  Foot rash. This consistent with tinea pedis based on exam today. Will have her stop the steroid and switch to Lamisil topical cream. New prescription sent to the pharmacy. Scarping sent for KOH.    Atypical chest pain -  She is still having some sxs and she  wans to be recheck for mold.    Hypokalemia - Told her not to take any extra potassium wjile on the spironolactone.

## 2017-05-18 DIAGNOSIS — Z888 Allergy status to other drugs, medicaments and biological substances status: Secondary | ICD-10-CM | POA: Diagnosis not present

## 2017-05-18 DIAGNOSIS — Z87891 Personal history of nicotine dependence: Secondary | ICD-10-CM | POA: Diagnosis not present

## 2017-05-18 DIAGNOSIS — Z886 Allergy status to analgesic agent status: Secondary | ICD-10-CM | POA: Diagnosis not present

## 2017-05-18 DIAGNOSIS — R9431 Abnormal electrocardiogram [ECG] [EKG]: Secondary | ICD-10-CM | POA: Diagnosis not present

## 2017-05-18 DIAGNOSIS — G44209 Tension-type headache, unspecified, not intractable: Secondary | ICD-10-CM | POA: Diagnosis not present

## 2017-05-18 DIAGNOSIS — Z881 Allergy status to other antibiotic agents status: Secondary | ICD-10-CM | POA: Diagnosis not present

## 2017-05-18 DIAGNOSIS — I10 Essential (primary) hypertension: Secondary | ICD-10-CM | POA: Diagnosis not present

## 2017-05-18 DIAGNOSIS — G8929 Other chronic pain: Secondary | ICD-10-CM | POA: Diagnosis not present

## 2017-05-18 DIAGNOSIS — Z885 Allergy status to narcotic agent status: Secondary | ICD-10-CM | POA: Diagnosis not present

## 2017-05-18 DIAGNOSIS — Z862 Personal history of diseases of the blood and blood-forming organs and certain disorders involving the immune mechanism: Secondary | ICD-10-CM | POA: Diagnosis not present

## 2017-05-18 DIAGNOSIS — Z79899 Other long term (current) drug therapy: Secondary | ICD-10-CM | POA: Diagnosis not present

## 2017-05-18 DIAGNOSIS — R0789 Other chest pain: Secondary | ICD-10-CM | POA: Diagnosis not present

## 2017-05-18 DIAGNOSIS — R51 Headache: Secondary | ICD-10-CM | POA: Diagnosis not present

## 2017-05-18 DIAGNOSIS — Z880 Allergy status to penicillin: Secondary | ICD-10-CM | POA: Diagnosis not present

## 2017-05-18 DIAGNOSIS — J45909 Unspecified asthma, uncomplicated: Secondary | ICD-10-CM | POA: Diagnosis not present

## 2017-05-18 LAB — FUNGAL STAIN
FUNGAL SMEAR:: NONE SEEN
MICRO NUMBER:: 81098720
SPECIMEN QUALITY:: ADEQUATE

## 2017-05-21 ENCOUNTER — Telehealth: Payer: Self-pay | Admitting: Family Medicine

## 2017-05-21 NOTE — Telephone Encounter (Signed)
Call pt: aldosterone level is normal.  Renin normal as well.

## 2017-05-22 DIAGNOSIS — I493 Ventricular premature depolarization: Secondary | ICD-10-CM | POA: Diagnosis not present

## 2017-05-22 DIAGNOSIS — I491 Atrial premature depolarization: Secondary | ICD-10-CM | POA: Diagnosis not present

## 2017-05-22 DIAGNOSIS — I471 Supraventricular tachycardia: Secondary | ICD-10-CM | POA: Insufficient documentation

## 2017-05-22 DIAGNOSIS — I10 Essential (primary) hypertension: Secondary | ICD-10-CM | POA: Diagnosis not present

## 2017-05-22 NOTE — Telephone Encounter (Signed)
Pt informed of results.Jennifer [name]

## 2017-05-24 ENCOUNTER — Ambulatory Visit (INDEPENDENT_AMBULATORY_CARE_PROVIDER_SITE_OTHER): Payer: Medicare Other | Admitting: Family Medicine

## 2017-05-24 ENCOUNTER — Encounter (HOSPITAL_BASED_OUTPATIENT_CLINIC_OR_DEPARTMENT_OTHER): Payer: Self-pay

## 2017-05-24 ENCOUNTER — Emergency Department (HOSPITAL_BASED_OUTPATIENT_CLINIC_OR_DEPARTMENT_OTHER)
Admission: EM | Admit: 2017-05-24 | Discharge: 2017-05-24 | Disposition: A | Discharge status: Home / Self Care | Payer: Medicare Other | Attending: Emergency Medicine | Admitting: Emergency Medicine

## 2017-05-24 ENCOUNTER — Encounter: Payer: Self-pay | Admitting: Family Medicine

## 2017-05-24 VITALS — BP 148/72 | HR 88 | Temp 98.5°F | Wt 215.0 lb

## 2017-05-24 DIAGNOSIS — Z8541 Personal history of malignant neoplasm of cervix uteri: Secondary | ICD-10-CM | POA: Diagnosis not present

## 2017-05-24 DIAGNOSIS — Z91010 Allergy to peanuts: Secondary | ICD-10-CM | POA: Diagnosis not present

## 2017-05-24 DIAGNOSIS — R21 Rash and other nonspecific skin eruption: Secondary | ICD-10-CM | POA: Diagnosis not present

## 2017-05-24 DIAGNOSIS — Z79899 Other long term (current) drug therapy: Secondary | ICD-10-CM | POA: Insufficient documentation

## 2017-05-24 DIAGNOSIS — J069 Acute upper respiratory infection, unspecified: Secondary | ICD-10-CM | POA: Insufficient documentation

## 2017-05-24 DIAGNOSIS — I10 Essential (primary) hypertension: Secondary | ICD-10-CM

## 2017-05-24 DIAGNOSIS — R519 Headache, unspecified: Secondary | ICD-10-CM

## 2017-05-24 DIAGNOSIS — G4489 Other headache syndrome: Secondary | ICD-10-CM | POA: Diagnosis not present

## 2017-05-24 DIAGNOSIS — J45909 Unspecified asthma, uncomplicated: Secondary | ICD-10-CM | POA: Diagnosis not present

## 2017-05-24 DIAGNOSIS — B349 Viral infection, unspecified: Secondary | ICD-10-CM | POA: Diagnosis not present

## 2017-05-24 DIAGNOSIS — Z87891 Personal history of nicotine dependence: Secondary | ICD-10-CM | POA: Diagnosis not present

## 2017-05-24 DIAGNOSIS — R51 Headache: Secondary | ICD-10-CM | POA: Diagnosis not present

## 2017-05-24 DIAGNOSIS — R221 Localized swelling, mass and lump, neck: Secondary | ICD-10-CM

## 2017-05-24 MED ORDER — DEXAMETHASONE SODIUM PHOSPHATE 4 MG/ML IJ SOLN
4.0000 mg | Freq: Once | INTRAMUSCULAR | Status: AC
  Administered 2017-05-24: 4 mg via INTRAMUSCULAR

## 2017-05-24 NOTE — Discharge Instructions (Signed)
I reviewed your office visit with your primary care doctor today they do give you a shot of dexamethasone. Based on your allergy profile this is about the only medicine he can, get help for the headache. Recommend he go home and rest. Follow-up with your doctor as needed.

## 2017-05-24 NOTE — ED Notes (Signed)
ED Provider at bedside. 
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
## 2017-05-24 NOTE — ED Triage Notes (Signed)
Pt c/o HA x 2 days-states she was seen by [facility] today for resp infection but did not have a HA at that time so she did not discuss with PCP-NAD-steady gait

## 2017-05-25 ENCOUNTER — Telehealth: Payer: Self-pay | Admitting: Family Medicine

## 2017-05-25 NOTE — Telephone Encounter (Signed)
Call patient: The IgE antibody level testing that we did was negative for the different types of molds.

## 2017-05-28 NOTE — Telephone Encounter (Signed)
Notified patient, verbalized understanding.

## 2017-05-29 DIAGNOSIS — R609 Edema, unspecified: Secondary | ICD-10-CM | POA: Diagnosis not present

## 2017-05-29 DIAGNOSIS — Z8249 Family history of ischemic heart disease and other diseases of the circulatory system: Secondary | ICD-10-CM | POA: Insufficient documentation

## 2017-05-29 DIAGNOSIS — E876 Hypokalemia: Secondary | ICD-10-CM | POA: Diagnosis not present

## 2017-05-29 DIAGNOSIS — I10 Essential (primary) hypertension: Secondary | ICD-10-CM | POA: Diagnosis not present

## 2017-05-29 DIAGNOSIS — M79604 Pain in right leg: Secondary | ICD-10-CM | POA: Diagnosis not present

## 2017-05-29 DIAGNOSIS — Z87891 Personal history of nicotine dependence: Secondary | ICD-10-CM | POA: Diagnosis not present

## 2017-05-29 DIAGNOSIS — E611 Iron deficiency: Secondary | ICD-10-CM | POA: Diagnosis not present

## 2017-05-29 DIAGNOSIS — R600 Localized edema: Secondary | ICD-10-CM | POA: Diagnosis not present

## 2017-05-29 DIAGNOSIS — Z91048 Other nonmedicinal substance allergy status: Secondary | ICD-10-CM | POA: Diagnosis not present

## 2017-05-29 DIAGNOSIS — M79605 Pain in left leg: Secondary | ICD-10-CM | POA: Diagnosis not present

## 2017-05-29 LAB — BASIC METABOLIC PANEL
BUN: 9 (ref 4–21)
Creatinine: 0.6 (ref 0.5–1.1)
Glucose: 108
Potassium: 3.8 (ref 3.4–5.3)
Sodium: 141 (ref 137–147)

## 2017-05-30 ENCOUNTER — Encounter: Payer: Self-pay | Admitting: Family Medicine

## 2017-05-30 ENCOUNTER — Ambulatory Visit (INDEPENDENT_AMBULATORY_CARE_PROVIDER_SITE_OTHER): Payer: Medicare Other | Admitting: Family Medicine

## 2017-05-30 VITALS — BP 138/70 | HR 87 | Ht 62.0 in | Wt 216.0 lb

## 2017-05-30 DIAGNOSIS — R05 Cough: Secondary | ICD-10-CM | POA: Diagnosis not present

## 2017-05-30 DIAGNOSIS — R600 Localized edema: Secondary | ICD-10-CM | POA: Insufficient documentation

## 2017-05-30 DIAGNOSIS — M549 Dorsalgia, unspecified: Secondary | ICD-10-CM | POA: Diagnosis not present

## 2017-05-30 DIAGNOSIS — Z23 Encounter for immunization: Secondary | ICD-10-CM

## 2017-05-30 DIAGNOSIS — I10 Essential (primary) hypertension: Secondary | ICD-10-CM

## 2017-05-30 DIAGNOSIS — R058 Other specified cough: Secondary | ICD-10-CM

## 2017-05-30 IMAGING — DX DG CHEST 1V
1 series · 1 of 1 positions shown · non-contrast
Comparison: 02/11/2016

CLINICAL DATA: Shortness of breath for 3 days.  History of asthma.

EXAM:
CHEST 1 VIEW

[chest pa]
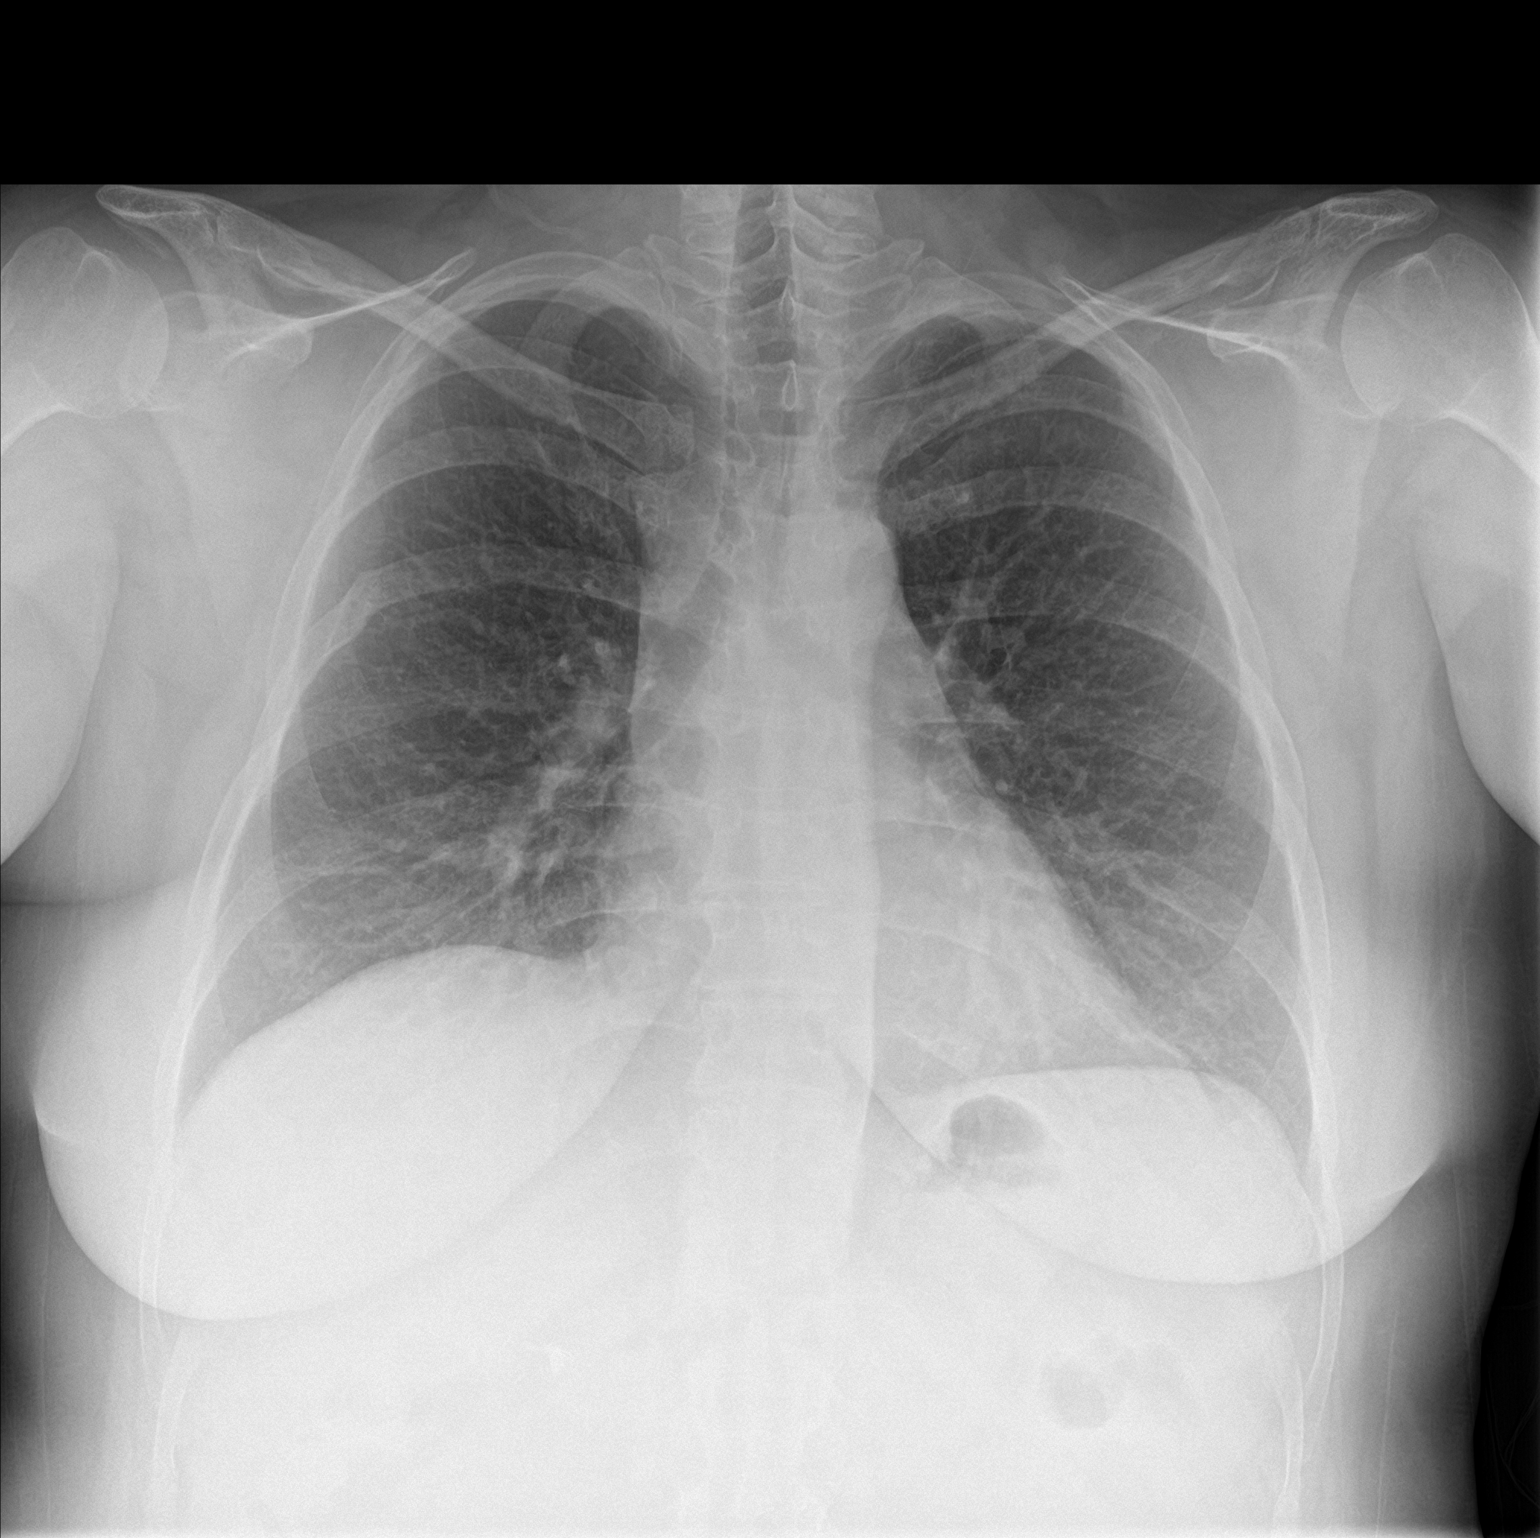

[1 of 1 positions shown; findings below may reference images not displayed]

FINDINGS: The heart size and mediastinal contours are within normal limits.
Both lungs are clear. The visualized skeletal structures are without
acute abnormality. Old right-sided right rib fractures noted.
IMPRESSION: No active disease.

## 2017-05-30 NOTE — Progress Notes (Signed)
Subjective:    CC:   HPI:  Hypertension- Pt denies chest pain, SOB, dizziness, or heart palpitations.  Taking meds as directed w/o problems.  Denies medication side effects.    Bilateral LE swelling.    Today she is having more left ankle pain and still some swelling bilat but worse on the elft.  She also feels like her thighs are bigger and titer at times. She did see cardiology and they did a venous Doppler. They just saw a mild amount of insufficiency but nothing significant and they recommended compression stockings to help with the swelling.  URI - she is overall feeling some better but still expressing some cough with occasional phlegm production. She feels like it's mostly just coming from the throat area. No significant nasal congestion or wheezing.  Today she also feels like there is a moving fluid sensation just under the left breast and laterally toward the axilla.  She still continues to have mid to low back pain. She says when she's lying on her back with her head propped up slightly and on her phone or watching TV she'll sometimes get a sudden shooting pain upper back that results in a throbbing in the back of her head. She says it started ever since she had a spinal tap  Past medical history, Surgical history, Family history not pertinant except as noted below, Social history, Allergies, and medications have been entered into the medical record, reviewed, and corrections made.   Review of Systems: No fevers, chills, night sweats, weight loss, chest pain, or shortness of breath.   Objective:    General: Well Developed, well nourished, and in no acute distress.  Neuro: Alert and oriented x3, extra-ocular muscles intact, sensation grossly intact.  HEENT: Normocephalic, atraumatic  Skin: Warm and dry, no rashes. Cardiac: Regular rate and rhythm, no murmurs rubs or gallops, no lower extremity edema.  Respiratory: Clear to auscultation bilaterally. Not using accessory muscles,  speaking in full sentences. MSK: Some trace edema over that lower anterior tibia  Impression and Recommendations:    HTN - Blood pressure looks a little bit better today. If she restarts her spironolactone that should lower even more.  Bilateral LE swelling.  Tried to reassure her she does not have renal failure, liver failure, heart failure, blood clots etc. I do think some of the images venous insufficiency is likely contributing but also her weight gain over the last years likely contribute and. Did encourage her to get the prescription filled for the compression stockings. Certainly she can restart her spondylectomy she would like. So recommend weight loss.  Mid to low back pain-she has a known herniated disc. I do think she would benefit from physical therapy but also recommended that she avoid lying in the position that clearly triggers the pain and headaches. And if she does experience it to get up new stretch exercises and anti-inflammatory and heating pad if needed.  Upper respiratory infection/post viral cough-overall she is getting better. Lungs are clear on exam.

## 2017-06-01 ENCOUNTER — Encounter: Payer: Self-pay | Admitting: Family Medicine

## 2017-06-01 DIAGNOSIS — Z1231 Encounter for screening mammogram for malignant neoplasm of breast: Secondary | ICD-10-CM | POA: Diagnosis not present

## 2017-06-01 DIAGNOSIS — I714 Abdominal aortic aneurysm, without rupture: Secondary | ICD-10-CM | POA: Diagnosis not present

## 2017-06-01 DIAGNOSIS — Z8249 Family history of ischemic heart disease and other diseases of the circulatory system: Secondary | ICD-10-CM | POA: Diagnosis not present

## 2017-06-01 LAB — RENIN
Aldosterone: 20
Renin Activity: <0.6

## 2017-06-04 LAB — HM MAMMOGRAPHY

## 2017-06-06 DIAGNOSIS — I10 Essential (primary) hypertension: Secondary | ICD-10-CM | POA: Diagnosis not present

## 2017-06-06 DIAGNOSIS — Z87891 Personal history of nicotine dependence: Secondary | ICD-10-CM | POA: Diagnosis not present

## 2017-06-06 DIAGNOSIS — E611 Iron deficiency: Secondary | ICD-10-CM | POA: Diagnosis not present

## 2017-06-06 DIAGNOSIS — Z91048 Other nonmedicinal substance allergy status: Secondary | ICD-10-CM | POA: Diagnosis not present

## 2017-06-06 DIAGNOSIS — E876 Hypokalemia: Secondary | ICD-10-CM | POA: Diagnosis not present

## 2017-06-07 DIAGNOSIS — R51 Headache: Secondary | ICD-10-CM | POA: Diagnosis not present

## 2017-06-07 DIAGNOSIS — Z862 Personal history of diseases of the blood and blood-forming organs and certain disorders involving the immune mechanism: Secondary | ICD-10-CM | POA: Diagnosis not present

## 2017-06-07 DIAGNOSIS — Z09 Encounter for follow-up examination after completed treatment for conditions other than malignant neoplasm: Secondary | ICD-10-CM | POA: Diagnosis not present

## 2017-06-07 DIAGNOSIS — R21 Rash and other nonspecific skin eruption: Secondary | ICD-10-CM | POA: Diagnosis not present

## 2017-06-13 ENCOUNTER — Ambulatory Visit (INDEPENDENT_AMBULATORY_CARE_PROVIDER_SITE_OTHER): Payer: Medicare Other | Admitting: Family Medicine

## 2017-06-13 ENCOUNTER — Encounter: Payer: Self-pay | Admitting: Family Medicine

## 2017-06-13 ENCOUNTER — Ambulatory Visit (INDEPENDENT_AMBULATORY_CARE_PROVIDER_SITE_OTHER): Payer: Medicare Other

## 2017-06-13 ENCOUNTER — Telehealth: Payer: Self-pay | Admitting: *Deleted

## 2017-06-13 VITALS — BP 145/84 | HR 87 | Ht 62.0 in | Wt 215.0 lb

## 2017-06-13 DIAGNOSIS — F418 Other specified anxiety disorders: Secondary | ICD-10-CM | POA: Diagnosis not present

## 2017-06-13 DIAGNOSIS — M79644 Pain in right finger(s): Secondary | ICD-10-CM

## 2017-06-13 DIAGNOSIS — M797 Fibromyalgia: Secondary | ICD-10-CM | POA: Diagnosis not present

## 2017-06-13 MED ORDER — ALPRAZOLAM 0.25 MG PO TABS: 0.2500 mg | ORAL_TABLET | Freq: Two times a day (BID) | ORAL | 0 refills | Status: DC | PRN
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
## 2017-06-13 NOTE — Telephone Encounter (Signed)
Called and [facility] for the patient to call the office. Needed to confirm appt for Friday

## 2017-06-14 ENCOUNTER — Encounter (HOSPITAL_BASED_OUTPATIENT_CLINIC_OR_DEPARTMENT_OTHER): Payer: Self-pay | Admitting: *Deleted

## 2017-06-14 ENCOUNTER — Emergency Department (HOSPITAL_BASED_OUTPATIENT_CLINIC_OR_DEPARTMENT_OTHER)
Admission: EM | Admit: 2017-06-14 | Discharge: 2017-06-14 | Disposition: A | Discharge status: Home / Self Care | Payer: Medicare Other | Attending: Emergency Medicine | Admitting: Emergency Medicine

## 2017-06-14 DIAGNOSIS — J45909 Unspecified asthma, uncomplicated: Secondary | ICD-10-CM | POA: Diagnosis not present

## 2017-06-14 DIAGNOSIS — R0602 Shortness of breath: Secondary | ICD-10-CM | POA: Diagnosis present

## 2017-06-14 DIAGNOSIS — Z91010 Allergy to peanuts: Secondary | ICD-10-CM | POA: Diagnosis not present

## 2017-06-14 DIAGNOSIS — I10 Essential (primary) hypertension: Secondary | ICD-10-CM | POA: Insufficient documentation

## 2017-06-14 DIAGNOSIS — Z79899 Other long term (current) drug therapy: Secondary | ICD-10-CM | POA: Diagnosis not present

## 2017-06-14 DIAGNOSIS — Z87891 Personal history of nicotine dependence: Secondary | ICD-10-CM | POA: Insufficient documentation

## 2017-06-14 DIAGNOSIS — R072 Precordial pain: Secondary | ICD-10-CM | POA: Diagnosis not present

## 2017-06-14 LAB — TROPONIN I: Troponin I: <0.03 ng/mL (ref <0.03)

## 2017-06-14 NOTE — ED Notes (Signed)
PT discharged to home NAD.  

## 2017-06-14 NOTE — Discharge Instructions (Signed)
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
## 2017-06-14 NOTE — ED Triage Notes (Addendum)
SOB since yesterday. She was seen by her MD yesterday but the pain started after she left the office. She thought it was anxiety but pain in her right breast has not gone away since yesterday. She is in no respiratory distress at triage. Ambulatory with no difficulty.

## 2017-06-15 ENCOUNTER — Ambulatory Visit: Payer: Self-pay | Admitting: Gynecology

## 2017-06-15 DIAGNOSIS — I10 Essential (primary) hypertension: Secondary | ICD-10-CM | POA: Diagnosis not present

## 2017-06-15 DIAGNOSIS — Z91048 Other nonmedicinal substance allergy status: Secondary | ICD-10-CM | POA: Diagnosis not present

## 2017-06-15 DIAGNOSIS — E876 Hypokalemia: Secondary | ICD-10-CM | POA: Diagnosis not present

## 2017-06-15 DIAGNOSIS — E611 Iron deficiency: Secondary | ICD-10-CM | POA: Diagnosis not present

## 2017-06-15 IMAGING — CT CT ABD-PELV W/ CM
2 of 5 series · 15 of 46 positions shown, 17 images · IV contrast (APPLIED)
Comparison: Prior CT from 02/27/2016.

CLINICAL DATA: Initial evaluation for acute left lower quadrant
pain, tenderness. Constipation.

EXAM:
CT ABDOMEN AND PELVIS WITH CONTRAST
TECHNIQUE: Multidetector CT imaging of the abdomen and pelvis was performed
using the standard protocol following bolus administration of
intravenous contrast.
CONTRAST:  100mL ZA7P3V-4SS IOPAMIDOL (ZA7P3V-4SS) INJECTION 61%

[Series 2: axial st · axial · 0.98mm/px · z∈[-471,+9]mm · 12 of 110 slices shown, 14 images]
[im 7/110  soft-tissue]
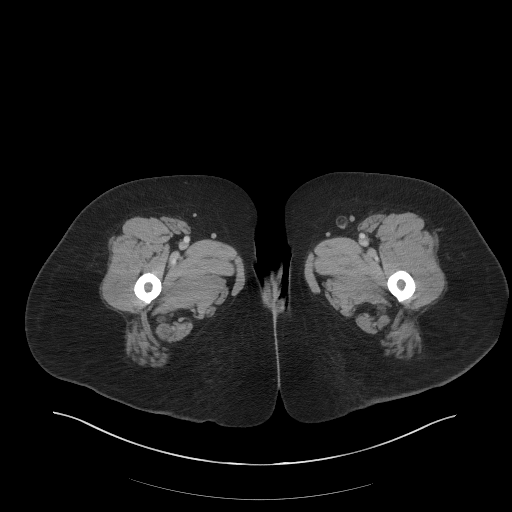
[im 7/110  bone]
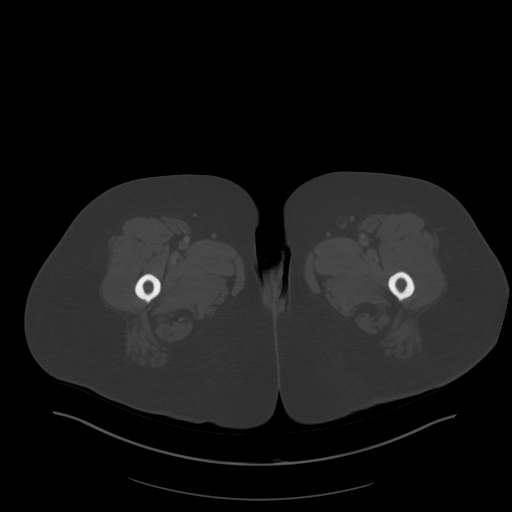
[im 19/110  soft-tissue]
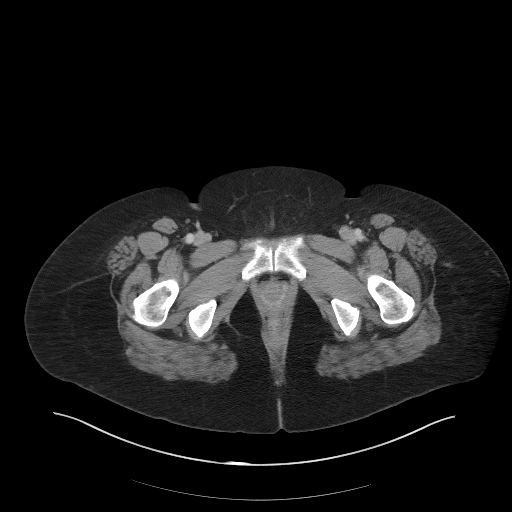
[im 25/110  soft-tissue]
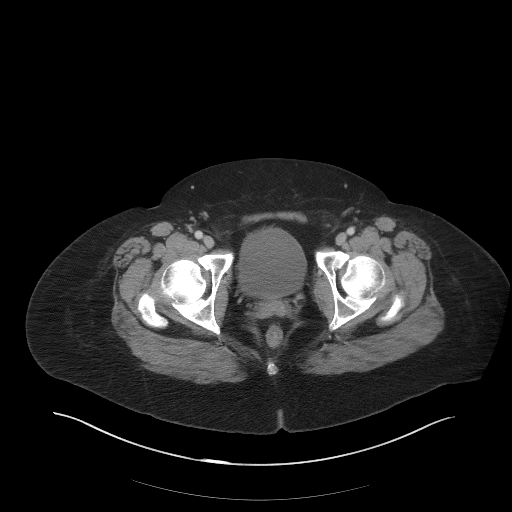
[im 31/110  soft-tissue]
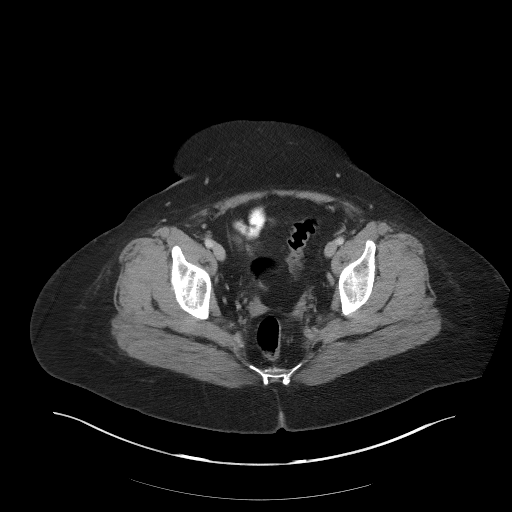
[im 43/110  soft-tissue]
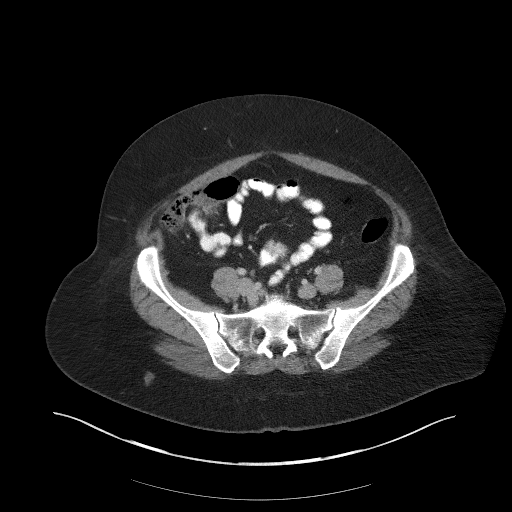
[im 49/110  soft-tissue]
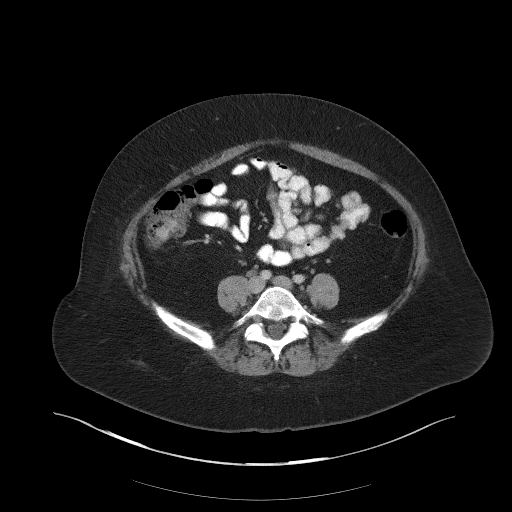
[im 61/110  soft-tissue]
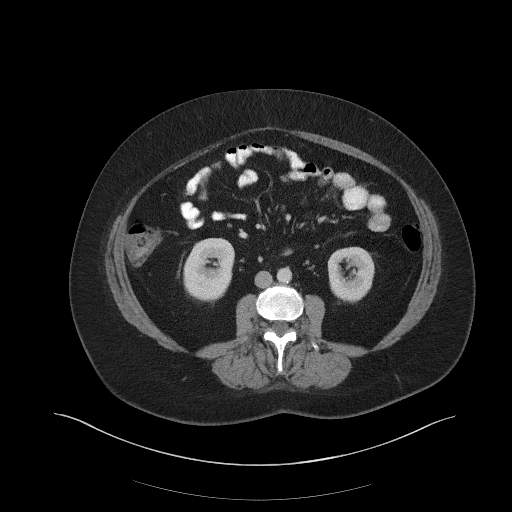
[im 67/110  soft-tissue]
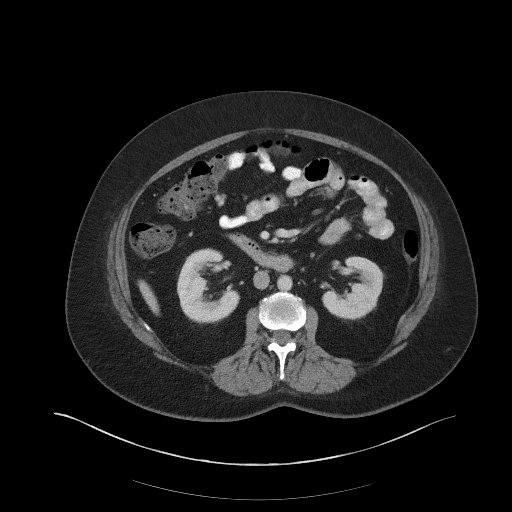
[im 79/110  soft-tissue]
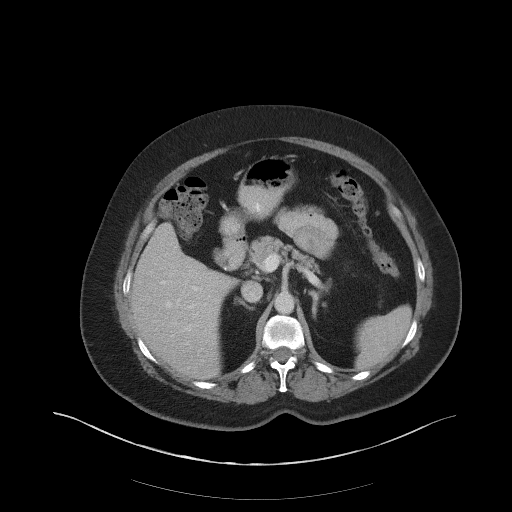
[im 79/110  bone]
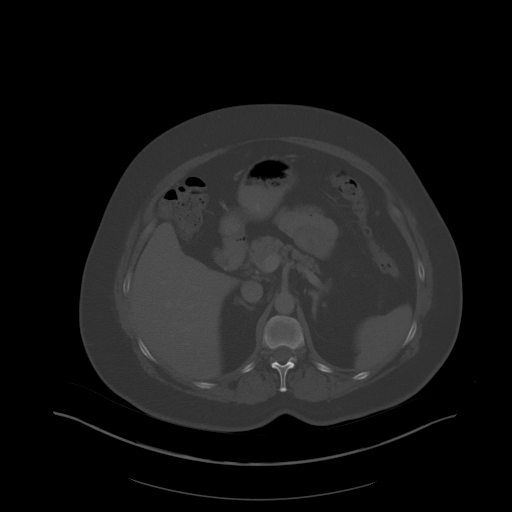
[im 85/110  soft-tissue]
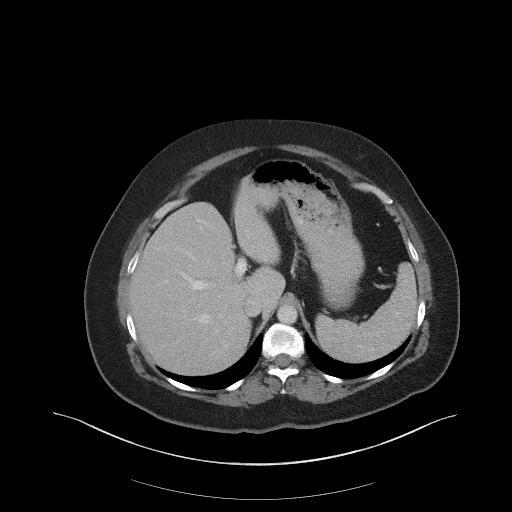
[im 91/110  soft-tissue]
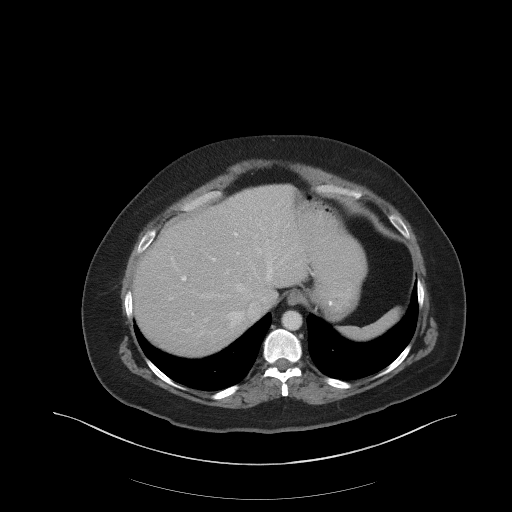
[im 103/110  soft-tissue]
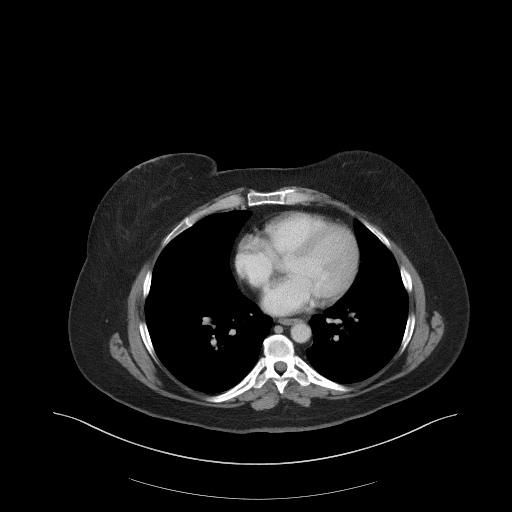

[Series 5: coronal st · coronal · 0.91mm/px · 3 of 101 slices shown]
[im 34/101  soft-tissue]
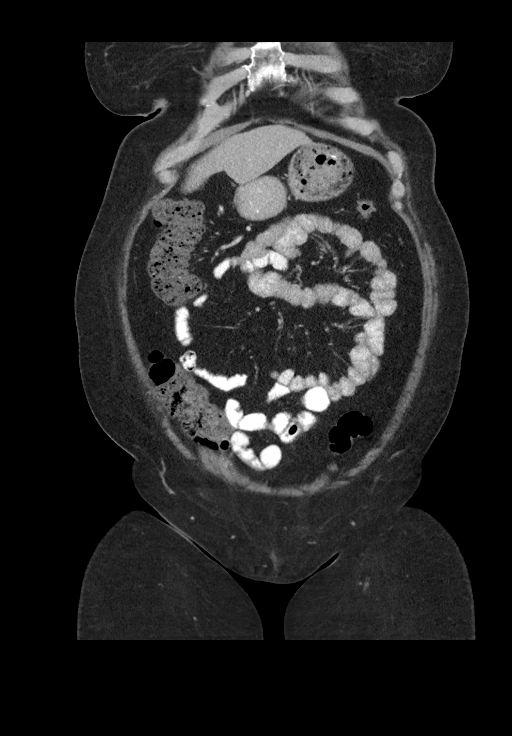
[im 45/101  soft-tissue]
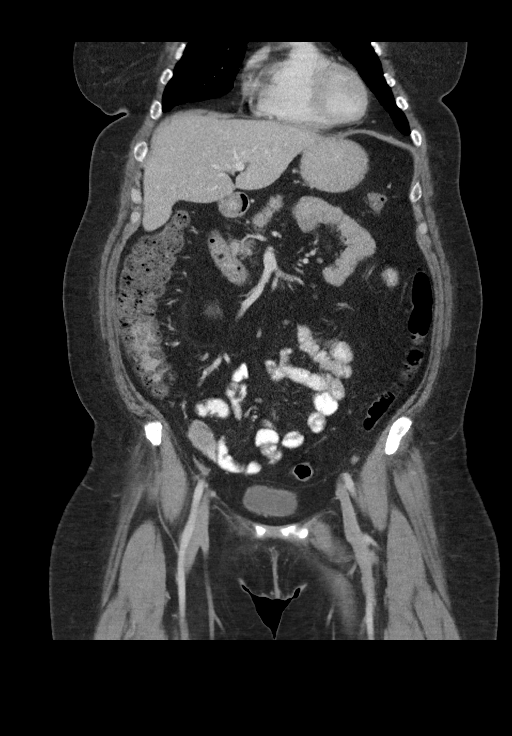
[im 56/101  soft-tissue]
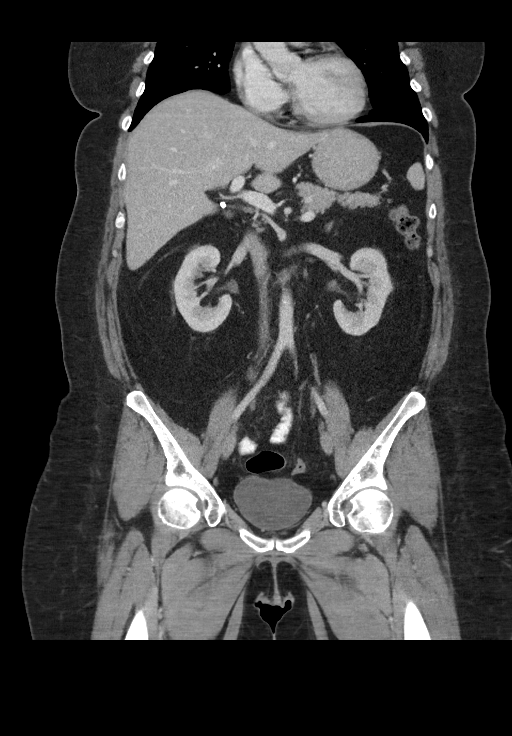

[15 of 46 positions shown; findings below may reference images not displayed]

FINDINGS: Visualized lung bases are clear.

12 mm hypodense lesion within the subcapsular right hepatic lobe
noted (series 2, image 29), indeterminate, but suspected to reflect
a small benign hemangioma. This similar to previous. Liver otherwise
unremarkable. Gallbladder surgically absent. No biliary dilatation.
Spleen, adrenal glands, and pancreas demonstrate a normal contrast
enhanced appearance.

Kidneys are equal size with symmetric enhancement. Subcentimeter
hypodense lesion within the left kidney too small the characterize.
No nephrolithiasis, hydronephrosis, or focal enhancing renal mass.

Stomach within normal limits. No evidence for bowel obstruction.
Appendix not visualize, compatible with history of prior
appendectomy. Moderate amount of retained stool within the colon,
suggesting constipation. No abnormal wall thickening, mucosal
enhancement, or inflammatory fat stranding seen about the bowels. No
significant diverticular disease identified.

Bladder within normal limits. Uterus is absent. Ovaries not
discretely identified.

No free air or fluid. No pathologically enlarged intra-abdominal or
pelvic lymph nodes. Mild aorto bi-iliac atherosclerotic disease. No
aneurysm.

Tiny fat containing ventral hernia noted.

No acute osseous abnormality. No worrisome lytic or blastic osseous
lesions.
IMPRESSION: 1. No CT evidence for acute intra-abdominal or pelvic process. No
significant diverticular disease identified.
2. Moderate amount of retained stool within the colon, suggesting
constipation.

## 2017-06-19 LAB — HM DIABETES EYE EXAM

## 2017-06-20 ENCOUNTER — Ambulatory Visit (INDEPENDENT_AMBULATORY_CARE_PROVIDER_SITE_OTHER): Payer: Medicare Other | Admitting: Family Medicine

## 2017-06-20 ENCOUNTER — Telehealth: Payer: Self-pay

## 2017-06-20 ENCOUNTER — Encounter: Payer: Self-pay | Admitting: Family Medicine

## 2017-06-20 VITALS — BP 147/80 | HR 88 | Ht 62.0 in | Wt 215.0 lb

## 2017-06-20 DIAGNOSIS — L299 Pruritus, unspecified: Secondary | ICD-10-CM | POA: Diagnosis not present

## 2017-06-20 DIAGNOSIS — M542 Cervicalgia: Secondary | ICD-10-CM

## 2017-06-20 DIAGNOSIS — I10 Essential (primary) hypertension: Secondary | ICD-10-CM

## 2017-06-20 DIAGNOSIS — E876 Hypokalemia: Secondary | ICD-10-CM

## 2017-06-20 NOTE — Telephone Encounter (Signed)
Jennifer [name] would like to have a gadolinium test done. She would like [place] to fax the order to [facility]. Please advise.   Fax - [phone]

## 2017-06-20 NOTE — Telephone Encounter (Signed)
[facility] called and would like another standing order for BMP. Last standing order will expire soon. Please advise.    [phone]
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
## 2017-06-20 NOTE — Patient Instructions (Signed)
Can try scalpacin for the itching on her scalp.

## 2017-06-21 IMAGING — US US EXTREM LOW VENOUS*L*
1 series · 13 of 24 positions shown · non-contrast
Comparison: None.

CLINICAL DATA: Left ankle and calf pain for the past 4 weeks.
History of uterine cancer. Evaluate for DVT.



[Series 1: us extrem low venous*left* · 0.08mm/px · 13 of 24 slices shown]
[im 1/24]
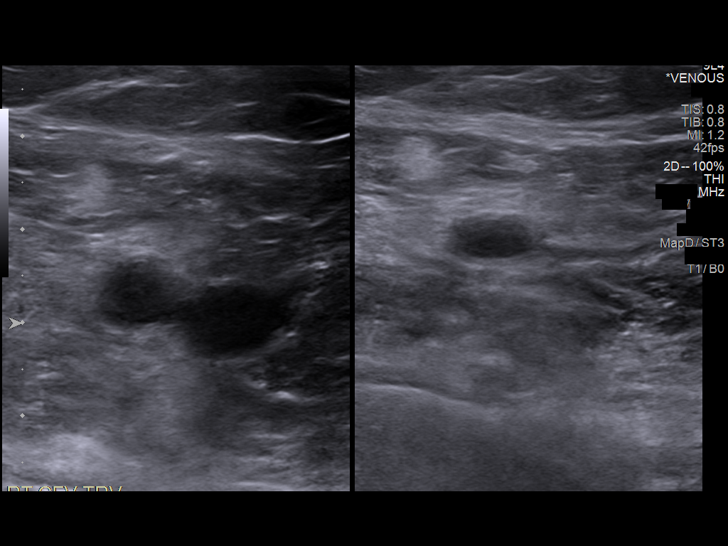
[im 3/24]
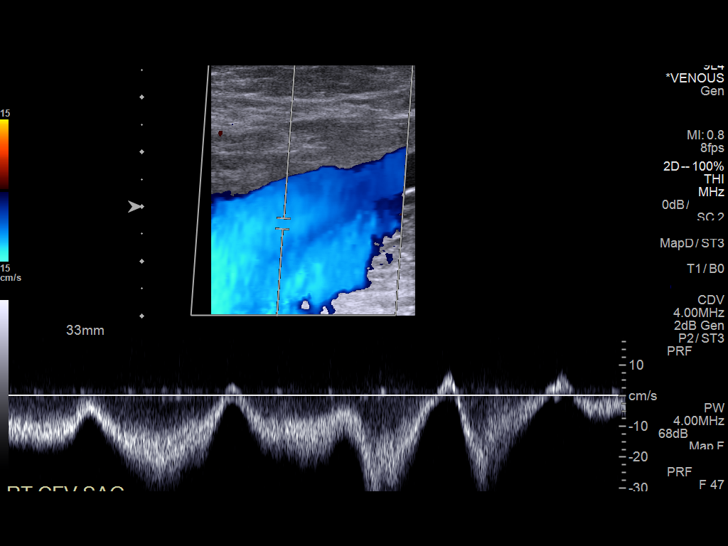
[im 5/24]
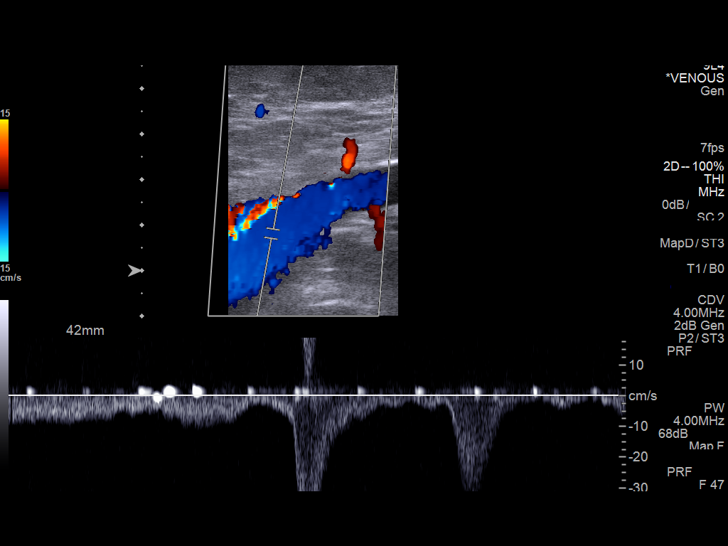
[im 7/24]
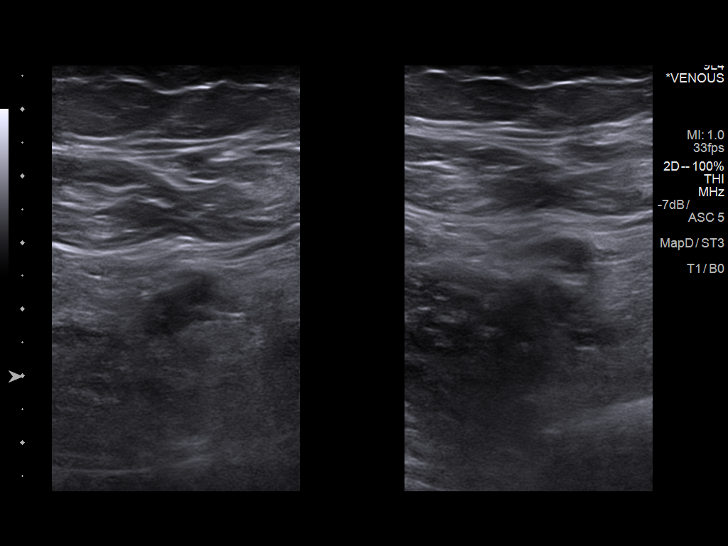
[im 9/24]
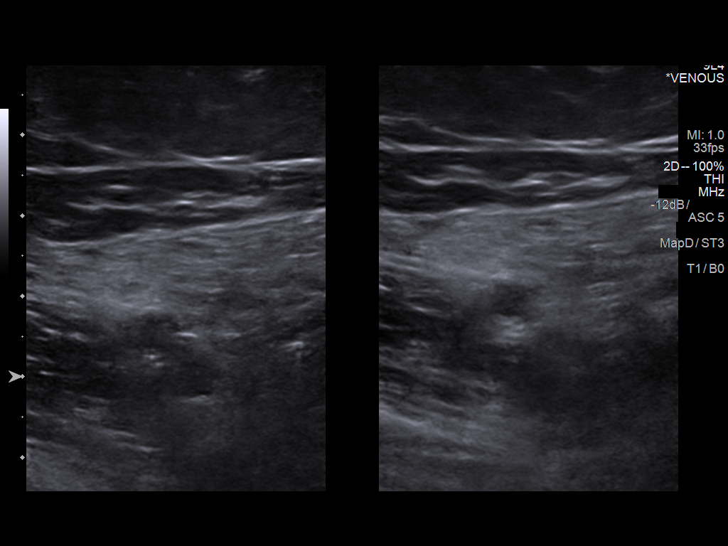
[im 11/24]
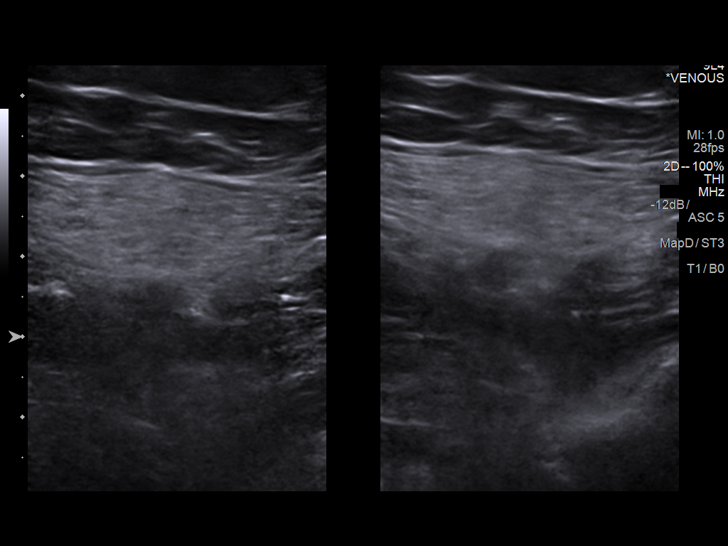
[im 13/24]
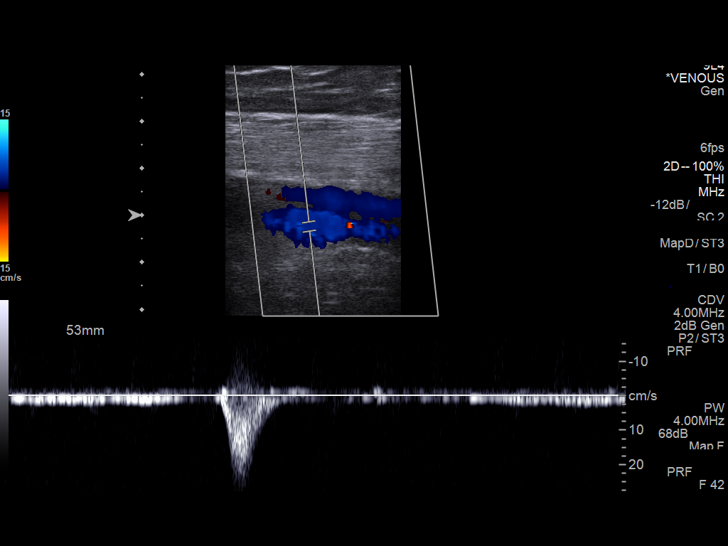
[im 14/24]
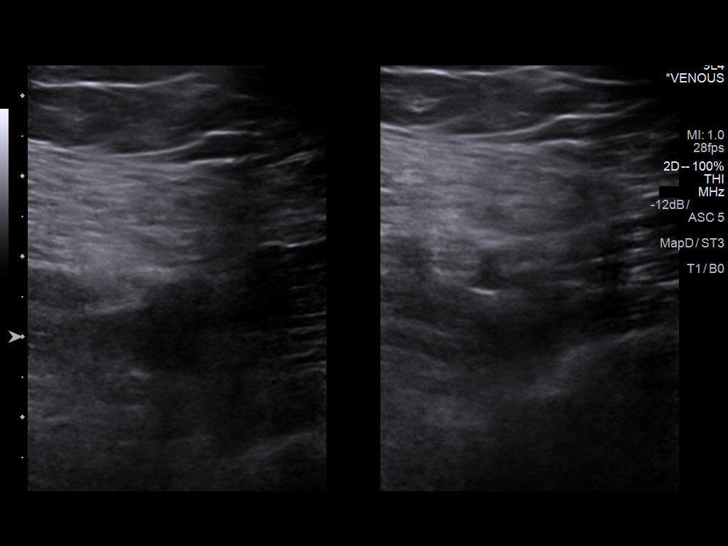
[im 16/24]
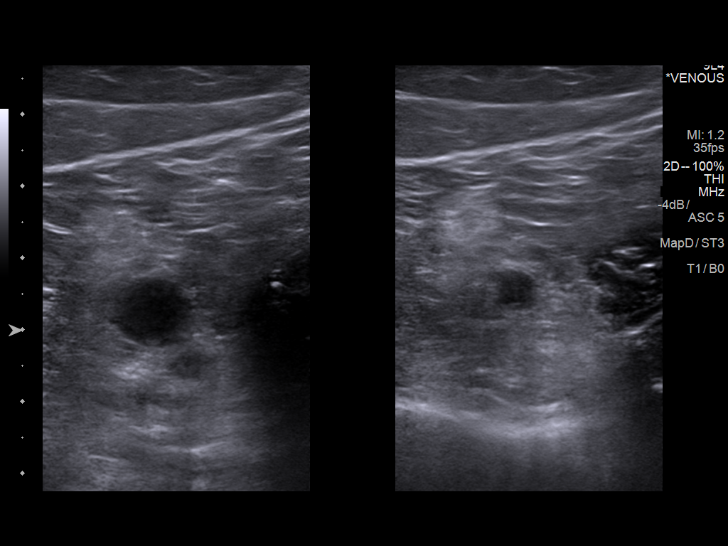
[im 18/24]
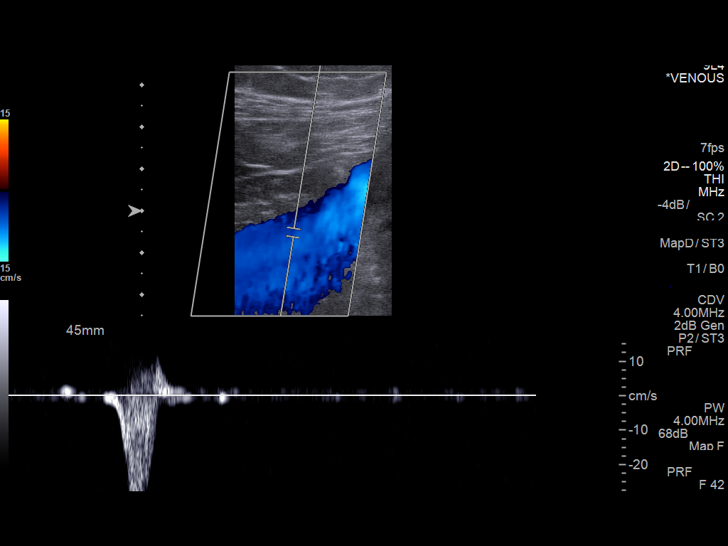
[im 20/24]
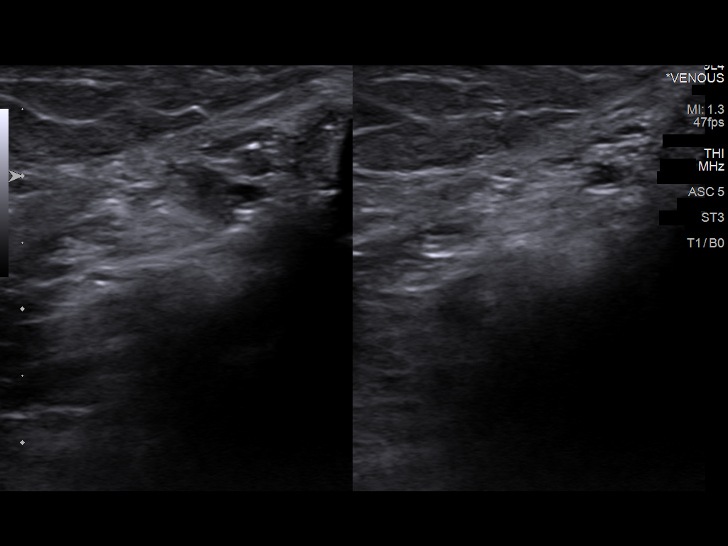
[im 22/24]
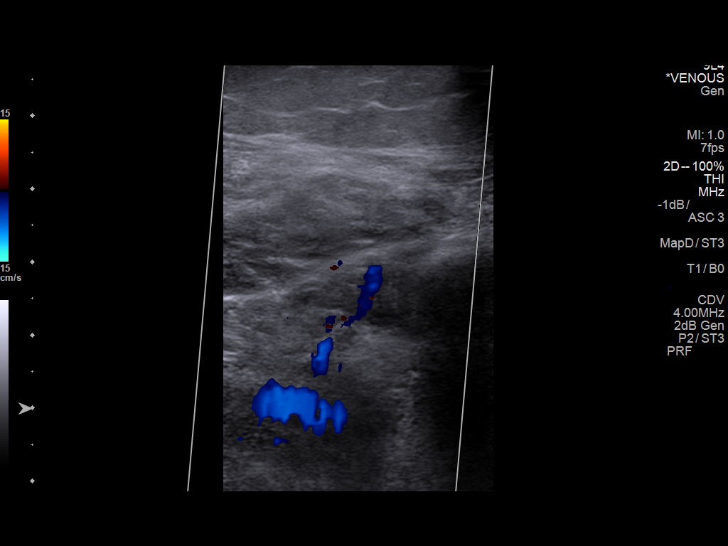
[im 24/24]
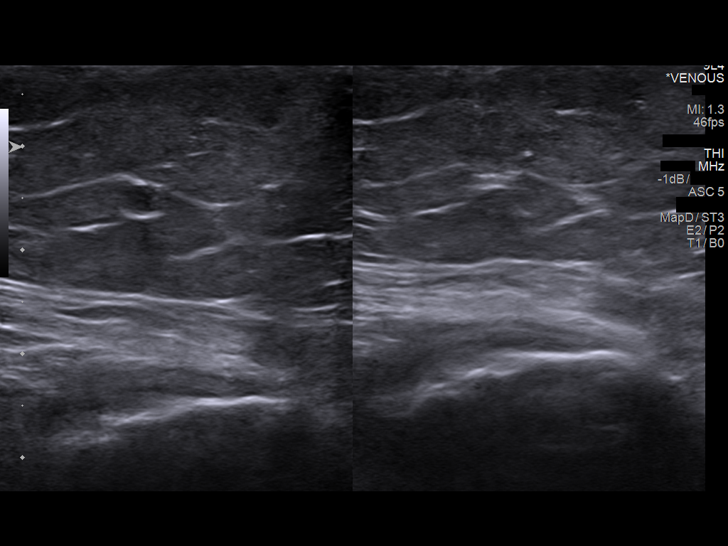

[13 of 24 positions shown; findings below may reference images not displayed]

FINDINGS: Contralateral Common Femoral Vein: Respiratory phasicity is normal
and symmetric with the symptomatic side. No evidence of thrombus.
Normal compressibility.

Common Femoral Vein: No evidence of thrombus. Normal
compressibility, respiratory phasicity and response to augmentation.

Saphenofemoral Junction: No evidence of thrombus. Normal
compressibility and flow on color Doppler imaging.

Profunda Femoral Vein: No evidence of thrombus. Normal
compressibility and flow on color Doppler imaging.

Femoral Vein: No evidence of thrombus. Normal compressibility,
respiratory phasicity and response to augmentation.

Popliteal Vein: No evidence of thrombus. Normal compressibility,
respiratory phasicity and response to augmentation.

Calf Veins: No evidence of thrombus. Normal compressibility and flow
on color Doppler imaging.

Superficial Great Saphenous Vein: No evidence of thrombus. Normal
compressibility and flow on color Doppler imaging.

Venous Reflux:  None.

Other Findings:  None.
IMPRESSION: No evidence DVT within the left lower extremity.
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
## 2017-06-21 NOTE — Telephone Encounter (Signed)
Faxed order

## 2017-06-21 NOTE — Telephone Encounter (Signed)
There is is no reason to do it. If she has some deposition of the metal from previous MRI then there is not way to remove it. She would just want to avoid future exposure if possible.  I am not aware of a test for it anyway. She doesn't have any kidney damage so that is reassuring as well.

## 2017-06-21 NOTE — Telephone Encounter (Signed)
Okay to place for standing order for BMP every 2 weeks with as needed refill.

## 2017-06-21 NOTE — Telephone Encounter (Signed)
She wants a test for gadolinium toxicity.

## 2017-06-21 NOTE — Telephone Encounter (Signed)
Gadolinium is used for contrast so not sure what type of test she is actually talking about.

## 2017-06-22 NOTE — Telephone Encounter (Signed)
Message left on vm with providers reccomendations

## 2017-06-25 ENCOUNTER — Telehealth: Payer: Self-pay

## 2017-06-25 NOTE — Telephone Encounter (Addendum)
Pt has cancelled multiple follow up appointments. Pt would rather go to physician in [place]. Reviewed with Joylene John, NP.  She stated that there is not a gyn onc. In [place].  The follow up recommendations for her disease is to have a pelvic exam once a year.  She could see Dr. Joya Gaskins in [facility] for those exams and he could refer back to this office if needed.

## 2017-06-27 ENCOUNTER — Ambulatory Visit (INDEPENDENT_AMBULATORY_CARE_PROVIDER_SITE_OTHER): Payer: Medicare Other | Admitting: Family Medicine

## 2017-06-27 VITALS — BP 173/78 | HR 100 | Ht 62.0 in | Wt 217.0 lb

## 2017-06-27 DIAGNOSIS — N898 Other specified noninflammatory disorders of vagina: Secondary | ICD-10-CM

## 2017-06-27 DIAGNOSIS — C4499 Other specified malignant neoplasm of skin, unspecified: Secondary | ICD-10-CM

## 2017-06-27 DIAGNOSIS — C519 Malignant neoplasm of vulva, unspecified: Secondary | ICD-10-CM

## 2017-06-27 DIAGNOSIS — R635 Abnormal weight gain: Secondary | ICD-10-CM

## 2017-06-27 DIAGNOSIS — I10 Essential (primary) hypertension: Secondary | ICD-10-CM

## 2017-06-27 NOTE — Progress Notes (Signed)
Subjective:    Patient ID: Jennifer [name], female    DOB: [birth year], 51 y.o.   MRN: [id number]  HPI 51 yo female comes today to f/u on hypertension-she has been having episodes of feeling almost lightheaded like she is going to fall backwards.  They have been intermittent and can happen at different times.  She thought maybe her blood pressure was going a little low so actually decreased her metoprolol to 25 mg.  She feels like her blood pressures have been up and downs.  She has not checked her blood pressure during 1 of these episodes to see if her blood pressure was high or low.  Usually they are brief.  She did take 25 mg of metoprolol around 10 AM this morning about an hour or 2-1/2 before her visit.  She is also very concerned about weight gain.  She feels like her abdomen is more distended than usual.  In fact she went to the emergency department yesterday and had an x-ray done of the abdomen just to rule out constipation though she felt like her stools were actually moving really well for her.  She feels like she has not had any major dietary changes and if anything feels like she is been more active with her grandchildren.  She is also noticed an odor from the groin area.  Not sure if it is vaginal or not but just feels like it becoming more noticeable.  She denies any change in discharge etc.  She does have a prior history of patch days and says she is going to have difficulty getting into [facility] to see her GYN gynecologist oncologist so they recommended that she at least be seen by a regular gynecologist yearly.   Review of Systems     Objective:   Physical Exam  Constitutional: She is oriented to person, place, and time. She appears well-developed and well-nourished.  HENT:  Head: Normocephalic and atraumatic.  Right Ear: External ear normal.  Left Ear: External ear normal.  Nose: Nose normal.  Mouth/Throat: Oropharynx is clear and moist.  TMs and canals are clear.    Eyes: Conjunctivae and EOM are normal. Pupils are equal, round, and reactive to light.  Neck: Neck supple. No thyromegaly present.  Cardiovascular: Normal rate, regular rhythm and normal heart sounds.  Pulmonary/Chest: Effort normal and breath sounds normal. She has no wheezes.  Abdominal: Soft. Bowel sounds are normal. She exhibits no distension and no mass. There is tenderness. There is no rebound and no guarding.  Diffuse tenderness  Lymphadenopathy:    She has no cervical adenopathy.  Neurological: She is alert and oriented to person, place, and time.  Skin: Skin is warm and dry.  Psychiatric: She has a normal mood and affect.          Assessment & Plan:  Abnormal weight gain-part of the weight gain I think is just her lack of activity.  When she was losing weight and doing well she was going to the gym regularly but she is just not felt well enough to do so.  I think she likely has been more active being around her grandchildren but is probably still not burning a significant amount of calories enough to lose weight.  Also check a thyroid level which we have not done in about a year and a half.  Do think she has significant bloating on exam today.  Odor-likely vaginal odor-had patient do a self wet prep today and will call with  results once available.  Hypertension-I want her to go back up to 50 mg.  Certainly if she has another 1 of these lightheaded episodes I want her to actually check her pressure if possible to see if it is high or low before adjusting her medication.  I really prefer that she call here and let me know what is going on.  History of paget's disease of the vulva, treated in 2013 at [facility] -still needs to be followed yearly .  She will not be fully released for about another year we need to try to get her in with GYN down the hall for at least a regular exam.

## 2017-06-27 NOTE — Patient Instructions (Signed)
Increase metoprolol to 50mg  again.

## 2017-06-28 LAB — URINE CULTURE
MICRO NUMBER:: 81284429
Result:: NO GROWTH
SPECIMEN QUALITY:: ADEQUATE

## 2017-06-28 LAB — WET PREP FOR TRICH, YEAST, CLUE
MICRO NUMBER:: 81284455
Specimen Quality: ADEQUATE

## 2017-06-29 ENCOUNTER — Encounter (HOSPITAL_BASED_OUTPATIENT_CLINIC_OR_DEPARTMENT_OTHER): Payer: Self-pay | Admitting: *Deleted

## 2017-06-29 ENCOUNTER — Emergency Department (HOSPITAL_BASED_OUTPATIENT_CLINIC_OR_DEPARTMENT_OTHER)
Admission: EM | Admit: 2017-06-29 | Discharge: 2017-06-29 | Disposition: A | Discharge status: Home / Self Care | Payer: Medicare Other | Attending: Emergency Medicine | Admitting: Emergency Medicine

## 2017-06-29 ENCOUNTER — Other Ambulatory Visit: Payer: Self-pay

## 2017-06-29 ENCOUNTER — Encounter: Payer: Self-pay | Admitting: Family Medicine

## 2017-06-29 ENCOUNTER — Telehealth: Payer: Self-pay | Admitting: Family Medicine

## 2017-06-29 DIAGNOSIS — M25512 Pain in left shoulder: Secondary | ICD-10-CM | POA: Diagnosis not present

## 2017-06-29 DIAGNOSIS — J45909 Unspecified asthma, uncomplicated: Secondary | ICD-10-CM | POA: Insufficient documentation

## 2017-06-29 DIAGNOSIS — Z87891 Personal history of nicotine dependence: Secondary | ICD-10-CM | POA: Insufficient documentation

## 2017-06-29 DIAGNOSIS — R002 Palpitations: Secondary | ICD-10-CM | POA: Diagnosis not present

## 2017-06-29 DIAGNOSIS — Z8541 Personal history of malignant neoplasm of cervix uteri: Secondary | ICD-10-CM | POA: Insufficient documentation

## 2017-06-29 DIAGNOSIS — R0789 Other chest pain: Secondary | ICD-10-CM | POA: Insufficient documentation

## 2017-06-29 DIAGNOSIS — Z91010 Allergy to peanuts: Secondary | ICD-10-CM | POA: Diagnosis not present

## 2017-06-29 DIAGNOSIS — R6884 Jaw pain: Secondary | ICD-10-CM | POA: Diagnosis not present

## 2017-06-29 DIAGNOSIS — I10 Essential (primary) hypertension: Secondary | ICD-10-CM | POA: Diagnosis not present

## 2017-06-29 DIAGNOSIS — Z79899 Other long term (current) drug therapy: Secondary | ICD-10-CM | POA: Insufficient documentation

## 2017-06-29 LAB — CBC
HCT: 43.4 % (ref 36.0–46.0)
Hemoglobin: 14.1 g/dL (ref 12.0–15.0)
MCH: 28.8 pg (ref 26.0–34.0)
MCHC: 32.5 g/dL (ref 30.0–36.0)
MCV: 88.8 fL (ref 78.0–100.0)
Platelets: 198 10*3/uL (ref 150–400)
RBC: 4.89 MIL/uL (ref 3.87–5.11)
RDW: 14.2 % (ref 11.5–15.5)
WBC: 5.9 10*3/uL (ref 4.0–10.5)

## 2017-06-29 LAB — BASIC METABOLIC PANEL
Anion gap: 9 (ref 5–15)
BUN: 14 mg/dL (ref 6–20)
CO2: 22 mmol/L (ref 22–32)
Calcium: 8.9 mg/dL (ref 8.9–10.3)
Chloride: 108 mmol/L (ref 101–111)
Creatinine, Ser: 0.72 mg/dL (ref 0.44–1.00)
GFR calc Af Amer: >60 mL/min (ref >60)
GFR calc non Af Amer: >60 mL/min (ref >60)
Glucose, Bld: 134 mg/dL — ABNORMAL HIGH (ref 65–99)
Potassium: 3.6 mmol/L (ref 3.5–5.1)
Sodium: 139 mmol/L (ref 135–145)

## 2017-06-29 LAB — TROPONIN I: Troponin I: <0.03 ng/mL (ref <0.03)
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
## 2017-06-29 NOTE — Telephone Encounter (Signed)
Pt called. She got a message through [employer] saying that she was overdue for a mammogram, she says she had a mammogram done 2 mnths ago.

## 2017-06-29 NOTE — ED Notes (Signed)
ED Provider at bedside. 

## 2017-06-29 NOTE — ED Triage Notes (Signed)
Palpitations since last night. Hx of same in the past. She is in no distress at triage. Very talkative.

## 2017-06-29 NOTE — Discharge Instructions (Signed)
If symptoms persist, follow-up with your cardiologist on Monday. Continue to take all your at home medications as prescribed. Make sure you are staying well-hydrated with water. Make sure that you are eating and sleeping well. Return to the emergency room if you develop persistent high fevers, difficulty breathing, persistent vomiting, or any new or worsening symptoms.

## 2017-07-02 NOTE — Telephone Encounter (Signed)
Mammogram was completed at [facility] on 06/04/2017.  Records requested.

## 2017-07-04 ENCOUNTER — Other Ambulatory Visit: Payer: Self-pay | Admitting: Family Medicine

## 2017-07-04 ENCOUNTER — Other Ambulatory Visit: Payer: Self-pay | Admitting: *Deleted

## 2017-07-04 ENCOUNTER — Ambulatory Visit (INDEPENDENT_AMBULATORY_CARE_PROVIDER_SITE_OTHER): Payer: Medicare Other | Admitting: Family Medicine

## 2017-07-04 ENCOUNTER — Encounter: Payer: Self-pay | Admitting: Family Medicine

## 2017-07-04 VITALS — BP 141/79 | HR 77 | Ht 62.0 in | Wt 216.0 lb

## 2017-07-04 DIAGNOSIS — Z7189 Other specified counseling: Secondary | ICD-10-CM

## 2017-07-04 DIAGNOSIS — C519 Malignant neoplasm of vulva, unspecified: Secondary | ICD-10-CM

## 2017-07-04 DIAGNOSIS — N898 Other specified noninflammatory disorders of vagina: Secondary | ICD-10-CM | POA: Diagnosis not present

## 2017-07-04 DIAGNOSIS — C4499 Other specified malignant neoplasm of skin, unspecified: Secondary | ICD-10-CM

## 2017-07-04 DIAGNOSIS — I10 Essential (primary) hypertension: Secondary | ICD-10-CM

## 2017-07-04 DIAGNOSIS — Z7184 Encounter for health counseling related to travel: Secondary | ICD-10-CM
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
## 2017-07-09 LAB — SURESWAB BACTERIAL VAGINOSIS/ITIS
Atopobium vaginae: NOT DETECTED Log (cells/mL)
C. albicans, DNA: NOT DETECTED
C. glabrata, DNA: NOT DETECTED
C. parapsilosis, DNA: NOT DETECTED
C. tropicalis, DNA: NOT DETECTED
Gardnerella vaginalis: NOT DETECTED Log (cells/mL)
LACTOBACILLUS SPECIES: NOT DETECTED Log (cells/mL)
MEGASPHAERA SPECIES: NOT DETECTED Log (cells/mL)
Trichomonas vaginalis RNA: NOT DETECTED

## 2017-07-10 ENCOUNTER — Encounter: Payer: Self-pay | Admitting: *Deleted

## 2017-07-12 ENCOUNTER — Telehealth: Payer: Self-pay | Admitting: Family Medicine

## 2017-07-12 NOTE — Telephone Encounter (Signed)
Disregard prevs phone message pt has appt scheduled for 07/13/17 and now would not say what concern was about stated has previously discussed concern and not feeling any better.

## 2017-07-12 NOTE — Telephone Encounter (Signed)
Patient called advised that she is not feeling well was seen by another provider for swollen legs and infection. Pt states as of yesterday her temp has increased from normal 97.8 to 99.8 and she can not tell if it is from her infection or if she is just feeling bad. Pt requesting to get 2:40 slot adv pt that is not 40 min option pt said she does not need 40 mins she can come in and get out in 20 mins. Jennifer [name] me she would call the 4:20 pt and ask her to come in at 2:40 so Jennifer [name] can have a full 20 and she said that is too late. Pt seem like she wasn't even sure she could make the 2:40 time and said could I hold it and she call bac to let me know if she could come I adv her I would send a message to pcp and could only hold slot until before lunch because other patients may need that slot. Sorry so long but it seems she wants this slot held too see if she is able to even come in because she is unsure she feels so bad and I offered urgent care she said they always tell her they cant help her see her pcp. Please adv

## 2017-07-13 ENCOUNTER — Ambulatory Visit (INDEPENDENT_AMBULATORY_CARE_PROVIDER_SITE_OTHER): Payer: Medicare Other | Admitting: Family Medicine

## 2017-07-13 ENCOUNTER — Encounter: Payer: Self-pay | Admitting: Family Medicine

## 2017-07-13 VITALS — BP 146/90 | HR 86 | Temp 99.0°F | Ht 62.0 in | Wt 216.0 lb

## 2017-07-13 DIAGNOSIS — R0789 Other chest pain: Secondary | ICD-10-CM | POA: Diagnosis not present

## 2017-07-13 DIAGNOSIS — C4499 Other specified malignant neoplasm of skin, unspecified: Secondary | ICD-10-CM

## 2017-07-13 DIAGNOSIS — L081 Erythrasma: Secondary | ICD-10-CM | POA: Diagnosis not present

## 2017-07-13 DIAGNOSIS — C519 Malignant neoplasm of vulva, unspecified: Secondary | ICD-10-CM

## 2017-07-13 MED ORDER — CLINDAMYCIN PHOS-BENZOYL PEROX 1-5 % EX GEL: Freq: Two times a day (BID) | CUTANEOUS | 0 refills | Status: DC

## 2017-07-13 MED ORDER — CLOTRIMAZOLE 1 % EX CREA: 1.0000 "application " | TOPICAL_CREAM | Freq: Two times a day (BID) | CUTANEOUS | 0 refills | Status: DC
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
## 2017-07-13 NOTE — Patient Instructions (Signed)
Apply both creams twice daily.  You can even mix them together if you would like.  Just rub in gently to the skin and around the perineum and on the inner thighs.  Let dry for 5-10 minutes before putting clothing on. Follow-up appointment next week.  If the rash is still present then we will do a biopsy.

## 2017-07-17 ENCOUNTER — Ambulatory Visit (INDEPENDENT_AMBULATORY_CARE_PROVIDER_SITE_OTHER): Payer: Medicare Other | Admitting: Family Medicine

## 2017-07-17 ENCOUNTER — Encounter: Payer: Self-pay | Admitting: Family Medicine

## 2017-07-17 VITALS — BP 137/77 | HR 93 | Ht 62.0 in | Wt 217.0 lb

## 2017-07-17 DIAGNOSIS — R140 Abdominal distension (gaseous): Secondary | ICD-10-CM

## 2017-07-17 DIAGNOSIS — N9089 Other specified noninflammatory disorders of vulva and perineum: Secondary | ICD-10-CM

## 2017-07-17 DIAGNOSIS — K5909 Other constipation: Secondary | ICD-10-CM

## 2017-07-17 DIAGNOSIS — L081 Erythrasma: Secondary | ICD-10-CM | POA: Diagnosis not present

## 2017-07-17 DIAGNOSIS — C4499 Other specified malignant neoplasm of skin, unspecified: Secondary | ICD-10-CM

## 2017-07-17 DIAGNOSIS — C519 Malignant neoplasm of vulva, unspecified: Secondary | ICD-10-CM
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
## 2017-07-18 ENCOUNTER — Encounter: Payer: Self-pay | Admitting: Family Medicine

## 2017-07-19 ENCOUNTER — Encounter (HOSPITAL_BASED_OUTPATIENT_CLINIC_OR_DEPARTMENT_OTHER): Payer: Self-pay

## 2017-07-19 ENCOUNTER — Other Ambulatory Visit: Payer: Self-pay

## 2017-07-19 ENCOUNTER — Emergency Department (HOSPITAL_BASED_OUTPATIENT_CLINIC_OR_DEPARTMENT_OTHER)
Admission: EM | Admit: 2017-07-19 | Discharge: 2017-07-19 | Disposition: A | Discharge status: Home / Self Care | Payer: Medicare Other | Attending: Emergency Medicine | Admitting: Emergency Medicine

## 2017-07-19 ENCOUNTER — Ambulatory Visit (HOSPITAL_COMMUNITY): Payer: Self-pay | Admitting: Licensed Clinical Social Worker

## 2017-07-19 DIAGNOSIS — R1013 Epigastric pain: Secondary | ICD-10-CM | POA: Insufficient documentation

## 2017-07-19 DIAGNOSIS — Z87891 Personal history of nicotine dependence: Secondary | ICD-10-CM | POA: Diagnosis not present

## 2017-07-19 DIAGNOSIS — I10 Essential (primary) hypertension: Secondary | ICD-10-CM | POA: Diagnosis not present

## 2017-07-19 DIAGNOSIS — J45909 Unspecified asthma, uncomplicated: Secondary | ICD-10-CM | POA: Insufficient documentation

## 2017-07-19 DIAGNOSIS — Z91010 Allergy to peanuts: Secondary | ICD-10-CM | POA: Insufficient documentation

## 2017-07-19 DIAGNOSIS — Z79899 Other long term (current) drug therapy: Secondary | ICD-10-CM | POA: Insufficient documentation

## 2017-07-19 LAB — CBC WITH DIFFERENTIAL/PLATELET
Basophils Absolute: 0 10*3/uL (ref 0.0–0.1)
Basophils Relative: 0 %
Eosinophils Absolute: 0.1 10*3/uL (ref 0.0–0.7)
Eosinophils Relative: 2 %
HCT: 43.4 % (ref 36.0–46.0)
Hemoglobin: 14.4 g/dL (ref 12.0–15.0)
Lymphocytes Relative: 18 %
Lymphs Abs: 1.2 10*3/uL (ref 0.7–4.0)
MCH: 29.4 pg (ref 26.0–34.0)
MCHC: 33.2 g/dL (ref 30.0–36.0)
MCV: 88.6 fL (ref 78.0–100.0)
Monocytes Absolute: 0.5 10*3/uL (ref 0.1–1.0)
Monocytes Relative: 8 %
Neutro Abs: 4.6 10*3/uL (ref 1.7–7.7)
Neutrophils Relative %: 72 %
Platelets: 205 10*3/uL (ref 150–400)
RBC: 4.9 MIL/uL (ref 3.87–5.11)
RDW: 14.1 % (ref 11.5–15.5)
WBC: 6.5 10*3/uL (ref 4.0–10.5)

## 2017-07-19 LAB — URINALYSIS, MICROSCOPIC (REFLEX)

## 2017-07-19 LAB — BASIC METABOLIC PANEL WITH GFR
Anion gap: 5 (ref 5–15)
BUN: 17 mg/dL (ref 6–20)
CO2: 25 mmol/L (ref 22–32)
Calcium: 8.7 mg/dL — ABNORMAL LOW (ref 8.9–10.3)
Chloride: 108 mmol/L (ref 101–111)
Creatinine, Ser: 0.56 mg/dL (ref 0.44–1.00)
GFR calc Af Amer: >60 mL/min
GFR calc non Af Amer: >60 mL/min
Glucose, Bld: 144 mg/dL — ABNORMAL HIGH (ref 65–99)
Potassium: 3.9 mmol/L (ref 3.5–5.1)
Sodium: 138 mmol/L (ref 135–145)

## 2017-07-19 LAB — URINALYSIS, ROUTINE W REFLEX MICROSCOPIC
Bilirubin Urine: NEGATIVE
Glucose, UA: NEGATIVE mg/dL
Ketones, ur: NEGATIVE mg/dL
Leukocytes, UA: NEGATIVE
Nitrite: NEGATIVE
Protein, ur: NEGATIVE mg/dL
Specific Gravity, Urine: 1.025 (ref 1.005–1.030)
pH: 6 (ref 5.0–8.0)

## 2017-07-19 NOTE — ED Triage Notes (Signed)
Pt reports abdominal pain for several days with some nausea. Pt has history of GI issues.

## 2017-07-19 NOTE — Discharge Instructions (Signed)
Please read instructions below. Drink clear liquids until your stomach feels better. Then, slowly introduce bland foods into your diet as tolerated, such as bread, rice, apples, bananas. Try to cut foods high in acidity, such as coffee, and avoid greasy/fatty foods as those can also be irritative to your stomach and harder to digest if you don't have a gallbladder. You can continue taking ranitidine if that provides you with relief. You can take your prescribed zofran as needed for nausea. Follow up with your primary care if symptoms persist. Return to the ER for severe abdominal pain, fever, uncontrollable vomiting, or new or concerning symptoms.
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
## 2017-07-20 ENCOUNTER — Encounter: Payer: Self-pay | Admitting: Family Medicine

## 2017-07-25 ENCOUNTER — Ambulatory Visit: Payer: Self-pay | Admitting: Family Medicine

## 2017-07-25 NOTE — Progress Notes (Deleted)
   Subjective:    Patient ID: Jennifer [name], female    DOB: 1965-08-16, 51 y.o.   MRN: [id number]  HPI 51 yo female comes in for wound check.  We did a perineal biopsy at the last office visit.  Biopsy report just showed squamous epithelial hyperplasia with no malignancy identified.  She has a prior history of Paget's.  She does have an appointment coming up with GYN I believe on the 20th of this month.  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 - NO DYSPLASIA OR MALIGNANCY IDENTIFIED.   Review of Systems     Objective:   Physical Exam        Assessment & Plan:

## 2017-07-27 ENCOUNTER — Ambulatory Visit: Payer: Self-pay | Admitting: Family Medicine

## 2017-07-27 ENCOUNTER — Encounter: Payer: Self-pay | Admitting: Family Medicine

## 2017-07-27 ENCOUNTER — Other Ambulatory Visit: Payer: Self-pay | Admitting: Family Medicine

## 2017-07-27 NOTE — Progress Notes (Deleted)
   Subjective:    Patient ID: Jennifer [name], female    DOB: 07-06-1966, 51 y.o.   MRN: [id number]  HPI 51 year old female comes in today for wound check.  We did a perineal biopsy a little over a week ago because she has a prior history of Paget's and wanted to make sure that she was not expensing a recurrence of this.  The biopsy came back negative for Paget's   Review of Systems     Objective:   Physical Exam        Assessment & Plan:

## 2017-07-31 ENCOUNTER — Ambulatory Visit: Payer: Self-pay | Admitting: Obstetrics and Gynecology

## 2017-08-01 ENCOUNTER — Encounter: Payer: Self-pay | Admitting: Family Medicine

## 2017-08-01 ENCOUNTER — Ambulatory Visit (INDEPENDENT_AMBULATORY_CARE_PROVIDER_SITE_OTHER): Payer: Medicare Other | Admitting: Family Medicine

## 2017-08-01 ENCOUNTER — Telehealth: Payer: Self-pay | Admitting: Family Medicine

## 2017-08-01 VITALS — BP 145/73 | HR 95 | Temp 98.6°F | Ht 62.0 in | Wt 217.0 lb

## 2017-08-01 DIAGNOSIS — K5909 Other constipation: Secondary | ICD-10-CM

## 2017-08-01 DIAGNOSIS — J209 Acute bronchitis, unspecified: Secondary | ICD-10-CM

## 2017-08-01 NOTE — Patient Instructions (Addendum)
Acute Bronchitis, Adult Acute bronchitis is sudden (acute) swelling of the air tubes (bronchi) in the lungs. Acute bronchitis causes these tubes to fill with mucus, which can make it hard to breathe. It can also cause coughing or wheezing. In adults, acute bronchitis usually goes away within 2 weeks. A cough caused by bronchitis may last up to 3 weeks. Smoking, allergies, and asthma can make the condition worse. Repeated episodes of bronchitis may cause further lung problems, such as chronic obstructive pulmonary disease (COPD). What are the causes? This condition can be caused by germs and by substances that irritate the lungs, including:  Cold and flu viruses. This condition is most often caused by the same virus that causes a cold.  Bacteria.  Exposure to tobacco smoke, dust, fumes, and air pollution.  What increases the risk? This condition is more likely to develop in people who:  Have close contact with someone with acute bronchitis.  Are exposed to lung irritants, such as tobacco smoke, dust, fumes, and vapors.  Have a weak immune system.  Have a respiratory condition such as asthma.  What are the signs or symptoms? Symptoms of this condition include:  A cough.  Coughing up clear, yellow, or green mucus.  Wheezing.  Chest congestion.  Shortness of breath.  A fever.  Body aches.  Chills.  A sore throat.  How is this diagnosed? This condition is usually diagnosed with a physical exam. During the exam, your health care provider may order tests, such as chest X-rays, to rule out other conditions. He or she may also:  Test a sample of your mucus for bacterial infection.  Check the level of oxygen in your blood. This is done to check for pneumonia.  Do a chest X-ray or lung function testing to rule out pneumonia and other conditions.  Perform blood tests.  Your health care provider will also ask about your symptoms and medical history. How is this  treated? Most cases of acute bronchitis clear up over time without treatment. Your health care provider may recommend:  Drinking more fluids. Drinking more makes your mucus thinner, which may make it easier to breathe.  Taking a medicine for a fever or cough.  Taking an antibiotic medicine.  Using an inhaler to help improve shortness of breath and to control a cough.  Using a cool mist vaporizer or humidifier to make it easier to breathe.  Follow these instructions at home: Medicines  Take over-the-counter and prescription medicines only as told by your health care provider.  If you were prescribed an antibiotic, take it as told by your health care provider. Do not stop taking the antibiotic even if you start to feel better. General instructions  Get plenty of rest.  Drink enough fluids to keep your urine clear or pale yellow.  Avoid smoking and secondhand smoke. Exposure to cigarette smoke or irritating chemicals will make bronchitis worse. If you smoke and you need help quitting, ask your health care provider. Quitting smoking will help your lungs heal faster.  Use an inhaler, cool mist vaporizer, or humidifier as told by your health care provider.  Keep all follow-up visits as told by your health care provider. This is important. How is this prevented? To lower your risk of getting this condition again:  Wash your hands often with soap and water. If soap and water are not available, use hand sanitizer.  Avoid contact with people who have cold symptoms.  Try not to touch your hands to your   mouth, nose, or eyes.  Make sure to get the flu shot every year.  Contact a health care provider if:  Your symptoms do not improve in 2 weeks of treatment. Get help right away if:  You cough up blood.  You have chest pain.  You have severe shortness of breath.  You become dehydrated.  You faint or keep feeling like you are going to faint.  You keep vomiting.  You have a  severe headache.  Your fever or chills gets worse. This information is not intended to replace advice given to you by your health care provider. Make sure you discuss any questions you have with your health care provider. Document Released: 09/07/2004 Document Revised: 02/23/2016 Document Reviewed: 01/19/2016 Elsevier Interactive Patient Education  2018 [employer]  
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
## 2017-08-02 ENCOUNTER — Emergency Department (HOSPITAL_BASED_OUTPATIENT_CLINIC_OR_DEPARTMENT_OTHER)
Admission: EM | Admit: 2017-08-02 | Discharge: 2017-08-02 | Disposition: A | Discharge status: Home / Self Care | Payer: Medicare Other | Attending: Emergency Medicine | Admitting: Emergency Medicine

## 2017-08-02 ENCOUNTER — Encounter (HOSPITAL_BASED_OUTPATIENT_CLINIC_OR_DEPARTMENT_OTHER): Payer: Self-pay | Admitting: *Deleted

## 2017-08-02 ENCOUNTER — Other Ambulatory Visit: Payer: Self-pay

## 2017-08-02 DIAGNOSIS — I10 Essential (primary) hypertension: Secondary | ICD-10-CM | POA: Insufficient documentation

## 2017-08-02 DIAGNOSIS — J45909 Unspecified asthma, uncomplicated: Secondary | ICD-10-CM | POA: Insufficient documentation

## 2017-08-02 DIAGNOSIS — J209 Acute bronchitis, unspecified: Secondary | ICD-10-CM | POA: Diagnosis not present

## 2017-08-02 DIAGNOSIS — G35 Multiple sclerosis: Secondary | ICD-10-CM | POA: Insufficient documentation

## 2017-08-02 DIAGNOSIS — Z87891 Personal history of nicotine dependence: Secondary | ICD-10-CM | POA: Insufficient documentation

## 2017-08-02 DIAGNOSIS — E785 Hyperlipidemia, unspecified: Secondary | ICD-10-CM | POA: Insufficient documentation

## 2017-08-02 DIAGNOSIS — R05 Cough: Secondary | ICD-10-CM | POA: Diagnosis present

## 2017-08-02 DIAGNOSIS — Z79899 Other long term (current) drug therapy: Secondary | ICD-10-CM | POA: Insufficient documentation

## 2017-08-02 NOTE — Discharge Instructions (Signed)
For pain control please take Ibuprofen (also known as Motrin or Advil) 400mg  (this is normally 2 over the counter pills) every 6 hours. Take with food to minimize stomach irritation.  Please follow with your primary care doctor in the next 2 days for a check-up. They must obtain records for further management.   Do not hesitate to return to the Emergency Department for any new, worsening or concerning symptoms.
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
## 2017-08-02 NOTE — ED Triage Notes (Signed)
Pt c/o cough  And bil rib pain. DX Bronchitis x 1 day ago

## 2017-08-11 ENCOUNTER — Other Ambulatory Visit: Payer: Self-pay

## 2017-08-11 ENCOUNTER — Emergency Department (HOSPITAL_BASED_OUTPATIENT_CLINIC_OR_DEPARTMENT_OTHER)
Admission: EM | Admit: 2017-08-11 | Discharge: 2017-08-11 | Disposition: A | Discharge status: Home / Self Care | Payer: Medicare Other | Attending: Emergency Medicine | Admitting: Emergency Medicine

## 2017-08-11 ENCOUNTER — Encounter (HOSPITAL_BASED_OUTPATIENT_CLINIC_OR_DEPARTMENT_OTHER): Payer: Self-pay | Admitting: Respiratory Therapy

## 2017-08-11 DIAGNOSIS — Z87891 Personal history of nicotine dependence: Secondary | ICD-10-CM | POA: Diagnosis not present

## 2017-08-11 DIAGNOSIS — J069 Acute upper respiratory infection, unspecified: Secondary | ICD-10-CM | POA: Diagnosis not present

## 2017-08-11 DIAGNOSIS — I10 Essential (primary) hypertension: Secondary | ICD-10-CM | POA: Insufficient documentation

## 2017-08-11 DIAGNOSIS — R05 Cough: Secondary | ICD-10-CM | POA: Diagnosis present

## 2017-08-11 DIAGNOSIS — J4520 Mild intermittent asthma, uncomplicated: Secondary | ICD-10-CM | POA: Insufficient documentation

## 2017-08-11 DIAGNOSIS — Z79899 Other long term (current) drug therapy: Secondary | ICD-10-CM | POA: Diagnosis not present

## 2017-08-11 DIAGNOSIS — B9789 Other viral agents as the cause of diseases classified elsewhere: Secondary | ICD-10-CM | POA: Insufficient documentation

## 2017-08-11 NOTE — ED Triage Notes (Signed)
Pt reports a cough x2 weeks that has worsened and making her back and "lung".  Pt was at [facility] last PM and LWBS. Pt reports SOB but is speaking very fast and in complete sentences. Pt in NAD on assessment.

## 2017-08-11 NOTE — ED Notes (Signed)
Pt very tearful on assessment. Pt talking over this RN. Pt not answering direct questions. Constantly speaking on her care at [facility]. This RN reassuring pt and allowing pt to vent. RN kept pt on monitor to assure her we are monitoring her O2 levels. Pt has equal and non labored resp. Pt informed the MD will be in to see her shortly.

## 2017-08-11 NOTE — Discharge Instructions (Signed)
Continue taking your doxycycline that was prescribed 2 evenings ago.  Continue over-the-counter medications as needed for symptomatic relief.  Follow-up with your primary doctor in the next 1-2 weeks if not improving.

## 2017-08-11 NOTE — ED Notes (Signed)
ED Provider at bedside. 

## 2017-08-11 NOTE — ED Notes (Signed)
Pt stormed out asking to be "let out of the department" as she walked towards the front doors.  Pt left on own volition, no distress noted, pt left without discharge instructions.
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
## 2017-08-15 ENCOUNTER — Encounter: Payer: Self-pay | Admitting: Family Medicine

## 2017-08-15 ENCOUNTER — Ambulatory Visit (INDEPENDENT_AMBULATORY_CARE_PROVIDER_SITE_OTHER): Payer: Medicare HMO | Admitting: Family Medicine

## 2017-08-15 VITALS — BP 158/89 | HR 88 | Ht 62.0 in | Wt 217.0 lb

## 2017-08-15 DIAGNOSIS — J208 Acute bronchitis due to other specified organisms: Secondary | ICD-10-CM | POA: Diagnosis not present

## 2017-08-15 DIAGNOSIS — R51 Headache: Secondary | ICD-10-CM

## 2017-08-15 DIAGNOSIS — E876 Hypokalemia: Secondary | ICD-10-CM | POA: Diagnosis not present

## 2017-08-15 DIAGNOSIS — R519 Headache, unspecified: Secondary | ICD-10-CM

## 2017-08-15 DIAGNOSIS — B9689 Other specified bacterial agents as the cause of diseases classified elsewhere: Secondary | ICD-10-CM

## 2017-08-15 DIAGNOSIS — R0602 Shortness of breath: Secondary | ICD-10-CM

## 2017-08-15 LAB — BASIC METABOLIC PANEL
BUN: 18 (ref 4–21)
Creatinine: 0.8 (ref 0.5–1.1)
Glucose: 98
Potassium: 4.1 (ref 3.4–5.3)
Sodium: 142 (ref 137–147)

## 2017-08-15 MED ORDER — IPRATROPIUM-ALBUTEROL 0.5-2.5 (3) MG/3ML IN SOLN
3.0000 mL | Freq: Once | RESPIRATORY_TRACT | Status: AC
  Administered 2017-08-15: 3 mL via RESPIRATORY_TRACT

## 2017-08-15 MED ORDER — IPRATROPIUM-ALBUTEROL 0.5-2.5 (3) MG/3ML IN SOLN: 3.0000 mL | Freq: Four times a day (QID) | RESPIRATORY_TRACT | Status: DC

## 2017-08-15 MED ORDER — CEFIXIME 200 MG PO CHEW: 200.0000 mg | CHEWABLE_TABLET | Freq: Two times a day (BID) | ORAL | 0 refills | Status: DC
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
## 2017-08-15 NOTE — Patient Instructions (Addendum)
Acute Bronchitis, Adult Acute bronchitis is sudden (acute) swelling of the air tubes (bronchi) in the lungs. Acute bronchitis causes these tubes to fill with mucus, which can make it hard to breathe. It can also cause coughing or wheezing. In adults, acute bronchitis usually goes away within 2 weeks. A cough caused by bronchitis may last up to 3 weeks. Smoking, allergies, and asthma can make the condition worse. Repeated episodes of bronchitis may cause further lung problems, such as chronic obstructive pulmonary disease (COPD). What are the causes? This condition can be caused by germs and by substances that irritate the lungs, including:  Cold and flu viruses. This condition is most often caused by the same virus that causes a cold.  Bacteria.  Exposure to tobacco smoke, dust, fumes, and air pollution.  What increases the risk? This condition is more likely to develop in people who:  Have close contact with someone with acute bronchitis.  Are exposed to lung irritants, such as tobacco smoke, dust, fumes, and vapors.  Have a weak immune system.  Have a respiratory condition such as asthma.  What are the signs or symptoms? Symptoms of this condition include:  A cough.  Coughing up clear, yellow, or green mucus.  Wheezing.  Chest congestion.  Shortness of breath.  A fever.  Body aches.  Chills.  A sore throat.  How is this diagnosed? This condition is usually diagnosed with a physical exam. During the exam, your health care provider may order tests, such as chest X-rays, to rule out other conditions. He or she may also:  Test a sample of your mucus for bacterial infection.  Check the level of oxygen in your blood. This is done to check for pneumonia.  Do a chest X-ray or lung function testing to rule out pneumonia and other conditions.  Perform blood tests.  Your health care provider will also ask about your symptoms and medical history. How is this  treated? Most cases of acute bronchitis clear up over time without treatment. Your health care provider may recommend:  Drinking more fluids. Drinking more makes your mucus thinner, which may make it easier to breathe.  Taking a medicine for a fever or cough.  Taking an antibiotic medicine.  Using an inhaler to help improve shortness of breath and to control a cough.  Using a cool mist vaporizer or humidifier to make it easier to breathe.  Follow these instructions at home: Medicines  Take over-the-counter and prescription medicines only as told by your health care provider.  If you were prescribed an antibiotic, take it as told by your health care provider. Do not stop taking the antibiotic even if you start to feel better. General instructions  Get plenty of rest.  Drink enough fluids to keep your urine clear or pale yellow.  Avoid smoking and secondhand smoke. Exposure to cigarette smoke or irritating chemicals will make bronchitis worse. If you smoke and you need help quitting, ask your health care provider. Quitting smoking will help your lungs heal faster.  Use an inhaler, cool mist vaporizer, or humidifier as told by your health care provider.  Keep all follow-up visits as told by your health care provider. This is important. How is this prevented? To lower your risk of getting this condition again:  Wash your hands often with soap and water. If soap and water are not available, use hand sanitizer.  Avoid contact with people who have cold symptoms.  Try not to touch your hands to your   mouth, nose, or eyes.  Make sure to get the flu shot every year.  Contact a health care provider if:  Your symptoms do not improve in 2 weeks of treatment. Get help right away if:  You cough up blood.  You have chest pain.  You have severe shortness of breath.  You become dehydrated.  You faint or keep feeling like you are going to faint.  You keep vomiting.  You have a  severe headache.  Your fever or chills gets worse. This information is not intended to replace advice given to you by your health care provider. Make sure you discuss any questions you have with your health care provider. Document Released: 09/07/2004 Document Revised: 02/23/2016 Document Reviewed: 01/19/2016 Elsevier Interactive Patient Education  2018 [employer]  

## 2017-08-17 DIAGNOSIS — M791 Myalgia, unspecified site: Secondary | ICD-10-CM | POA: Diagnosis not present

## 2017-08-17 DIAGNOSIS — R0602 Shortness of breath: Secondary | ICD-10-CM | POA: Diagnosis not present

## 2017-08-17 DIAGNOSIS — R0982 Postnasal drip: Secondary | ICD-10-CM | POA: Diagnosis not present

## 2017-08-17 DIAGNOSIS — R490 Dysphonia: Secondary | ICD-10-CM | POA: Diagnosis not present

## 2017-08-17 DIAGNOSIS — Z87891 Personal history of nicotine dependence: Secondary | ICD-10-CM | POA: Diagnosis not present

## 2017-08-17 DIAGNOSIS — M79601 Pain in right arm: Secondary | ICD-10-CM | POA: Diagnosis not present

## 2017-08-17 DIAGNOSIS — R079 Chest pain, unspecified: Secondary | ICD-10-CM | POA: Diagnosis not present

## 2017-08-17 DIAGNOSIS — I517 Cardiomegaly: Secondary | ICD-10-CM | POA: Diagnosis not present

## 2017-08-17 DIAGNOSIS — R05 Cough: Secondary | ICD-10-CM | POA: Diagnosis not present

## 2017-08-17 DIAGNOSIS — J069 Acute upper respiratory infection, unspecified: Secondary | ICD-10-CM | POA: Diagnosis not present

## 2017-08-17 IMAGING — DX DG TOE GREAT 2+V*R*
4 series · 4 of 4 positions shown · non-contrast
Comparison: Right foot series 12/22/2015.

CLINICAL DATA: 50-year-old female status post right great toe
injury 4 days ago, blunt trauma. Pain swelling and bruising. Initial
encounter.

EXAM:
RIGHT GREAT TOE

[toe ap]
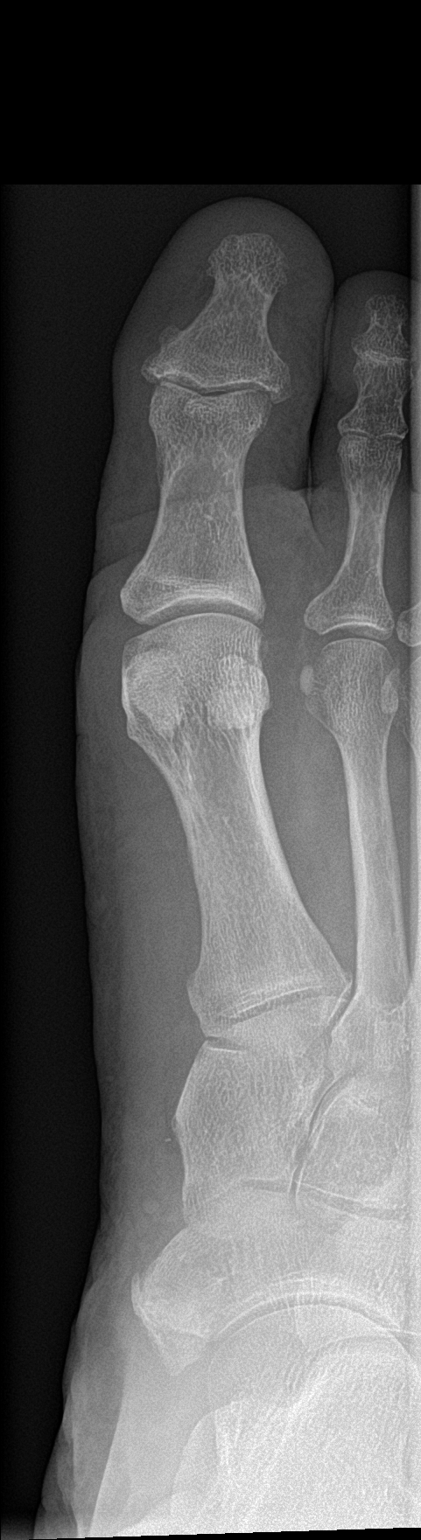

[toe obl]
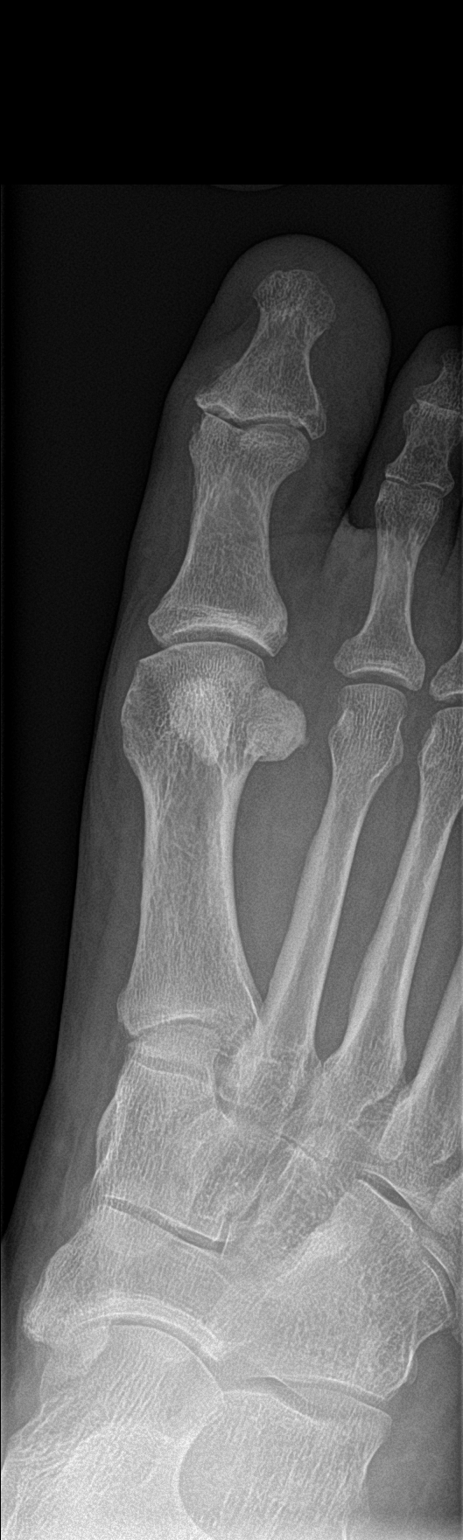

[toe lat (1 of 2)]
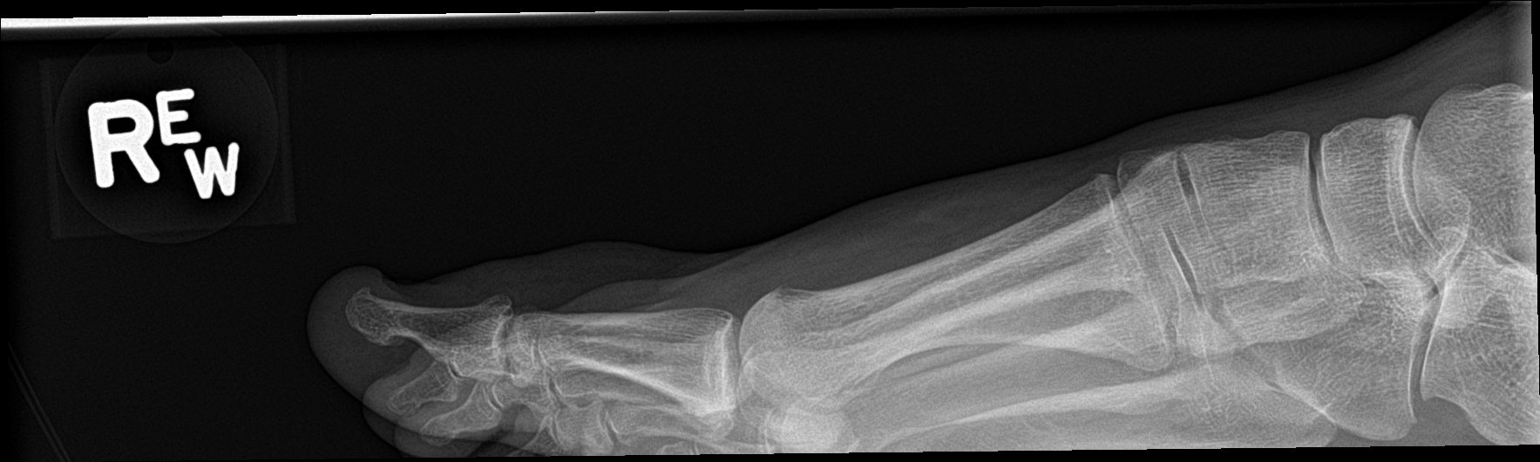

[toe lat (2 of 2)]
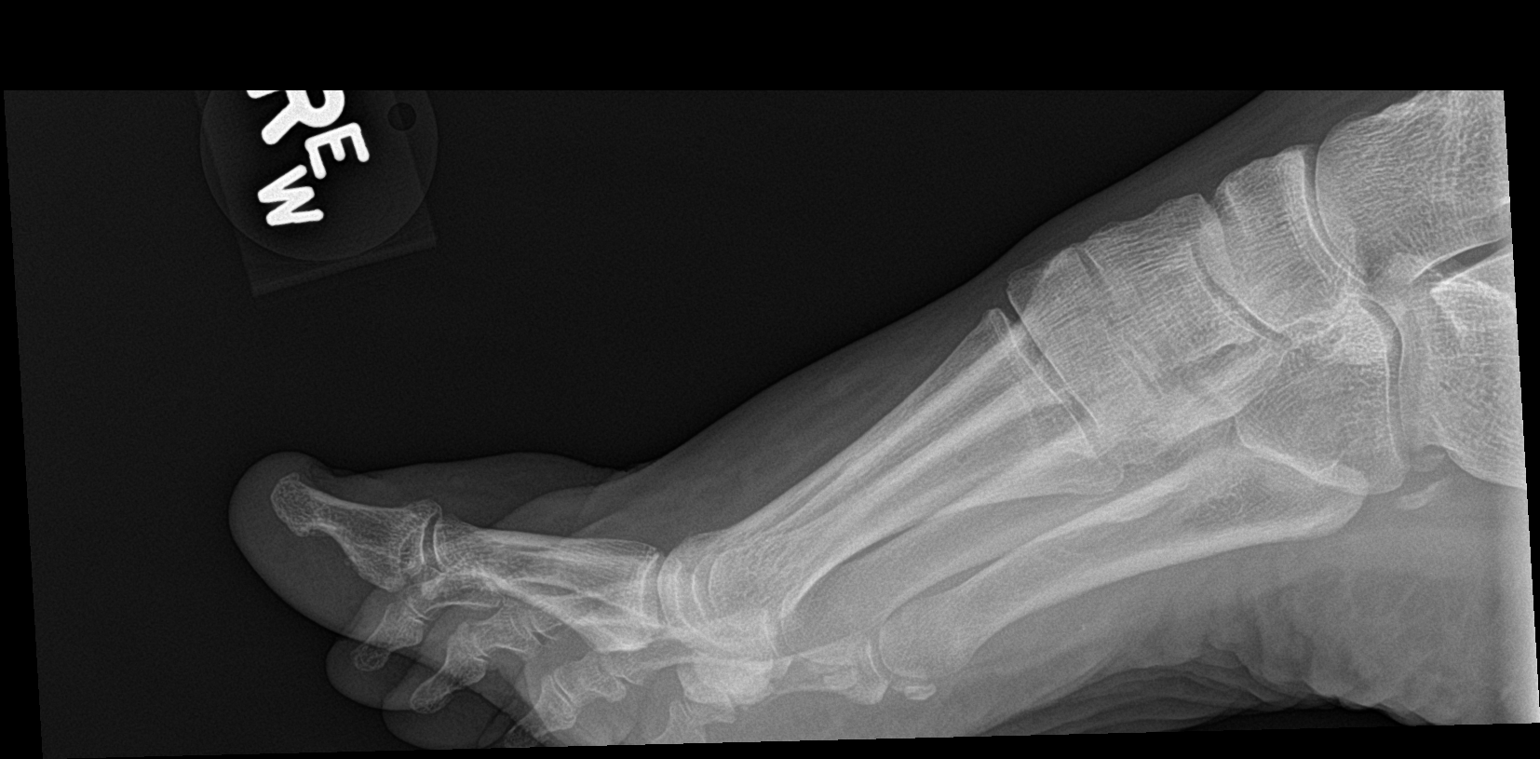

[4 of 4 positions shown; findings below may reference images not displayed]

FINDINGS: Right first toe phalanges appear stable and intact. First ray joint
spaces appear stable. First meta tarsal is intact. Other visible
osseous structures appear stable and intact.
IMPRESSION: No acute fracture or dislocation identified about the right great
toe.

## 2017-08-22 ENCOUNTER — Encounter: Payer: Self-pay | Admitting: Family Medicine

## 2017-08-22 ENCOUNTER — Ambulatory Visit (INDEPENDENT_AMBULATORY_CARE_PROVIDER_SITE_OTHER): Payer: Medicare HMO | Admitting: Family Medicine

## 2017-08-22 VITALS — BP 162/86 | HR 80 | Ht 62.0 in | Wt 217.0 lb

## 2017-08-22 DIAGNOSIS — I10 Essential (primary) hypertension: Secondary | ICD-10-CM

## 2017-08-22 DIAGNOSIS — R05 Cough: Secondary | ICD-10-CM

## 2017-08-22 DIAGNOSIS — R635 Abnormal weight gain: Secondary | ICD-10-CM | POA: Diagnosis not present

## 2017-08-22 DIAGNOSIS — J40 Bronchitis, not specified as acute or chronic: Secondary | ICD-10-CM | POA: Diagnosis not present

## 2017-08-22 DIAGNOSIS — R059 Cough, unspecified: Secondary | ICD-10-CM

## 2017-08-22 DIAGNOSIS — E669 Obesity, unspecified: Secondary | ICD-10-CM

## 2017-08-22 DIAGNOSIS — M7989 Other specified soft tissue disorders: Secondary | ICD-10-CM | POA: Diagnosis not present

## 2017-08-22 DIAGNOSIS — I493 Ventricular premature depolarization: Secondary | ICD-10-CM | POA: Diagnosis not present

## 2017-08-22 DIAGNOSIS — I471 Supraventricular tachycardia: Secondary | ICD-10-CM | POA: Diagnosis not present

## 2017-08-22 DIAGNOSIS — I491 Atrial premature depolarization: Secondary | ICD-10-CM | POA: Diagnosis not present
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
## 2017-08-22 NOTE — Patient Instructions (Signed)
Stop your saline rinse.

## 2017-08-23 ENCOUNTER — Emergency Department (INDEPENDENT_AMBULATORY_CARE_PROVIDER_SITE_OTHER)
Admission: EM | Admit: 2017-08-23 | Discharge: 2017-08-23 | Disposition: A | Discharge status: Home / Self Care | Payer: Medicare HMO | Source: Home / Self Care

## 2017-08-23 ENCOUNTER — Other Ambulatory Visit: Payer: Self-pay

## 2017-08-23 ENCOUNTER — Encounter: Payer: Self-pay | Admitting: *Deleted

## 2017-08-23 ENCOUNTER — Encounter: Payer: Self-pay | Admitting: Family Medicine

## 2017-08-23 DIAGNOSIS — F458 Other somatoform disorders: Secondary | ICD-10-CM | POA: Diagnosis not present

## 2017-08-23 DIAGNOSIS — H00011 Hordeolum externum right upper eyelid: Secondary | ICD-10-CM | POA: Diagnosis not present

## 2017-08-23 DIAGNOSIS — R05 Cough: Secondary | ICD-10-CM | POA: Diagnosis not present

## 2017-08-23 DIAGNOSIS — R6889 Other general symptoms and signs: Secondary | ICD-10-CM | POA: Diagnosis not present

## 2017-08-23 DIAGNOSIS — J029 Acute pharyngitis, unspecified: Secondary | ICD-10-CM | POA: Diagnosis not present

## 2017-08-23 DIAGNOSIS — M79641 Pain in right hand: Secondary | ICD-10-CM

## 2017-08-23 DIAGNOSIS — R0989 Other specified symptoms and signs involving the circulatory and respiratory systems: Secondary | ICD-10-CM | POA: Diagnosis not present

## 2017-08-23 MED ORDER — POLYMYXIN B-TRIMETHOPRIM 10000-0.1 UNIT/ML-% OP SOLN: 1.0000 [drp] | OPHTHALMIC | 0 refills | Status: DC
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
## 2017-08-23 NOTE — ED Triage Notes (Signed)
Patient c/o right eye pain x 3 days, possible stye. Also c/o 1 week of later right hand pain and pain in her 4th and 5th fingers. No known injury but does report hitting her fist on the counter when she gets upset.

## 2017-08-23 NOTE — Discharge Instructions (Signed)
Warm compresses to swollen area of your eyelid.  Rest right hand

## 2017-08-24 ENCOUNTER — Encounter: Payer: Self-pay | Admitting: Family Medicine

## 2017-08-27 ENCOUNTER — Encounter: Payer: Self-pay | Admitting: Family Medicine

## 2017-08-28 ENCOUNTER — Other Ambulatory Visit: Payer: Self-pay | Admitting: Family Medicine

## 2017-08-28 DIAGNOSIS — J3089 Other allergic rhinitis: Secondary | ICD-10-CM

## 2017-08-28 DIAGNOSIS — T783XXA Angioneurotic edema, initial encounter: Secondary | ICD-10-CM

## 2017-08-29 DIAGNOSIS — E876 Hypokalemia: Secondary | ICD-10-CM | POA: Diagnosis not present

## 2017-08-30 ENCOUNTER — Ambulatory Visit (INDEPENDENT_AMBULATORY_CARE_PROVIDER_SITE_OTHER): Payer: Medicare HMO | Admitting: Family Medicine

## 2017-08-30 ENCOUNTER — Encounter: Payer: Self-pay | Admitting: Family Medicine

## 2017-08-30 VITALS — BP 127/79 | HR 58 | Ht 62.0 in | Wt 215.0 lb

## 2017-08-30 DIAGNOSIS — R1010 Upper abdominal pain, unspecified: Secondary | ICD-10-CM | POA: Diagnosis not present

## 2017-08-30 DIAGNOSIS — I10 Essential (primary) hypertension: Secondary | ICD-10-CM

## 2017-08-30 DIAGNOSIS — H00021 Hordeolum internum right upper eyelid: Secondary | ICD-10-CM | POA: Diagnosis not present

## 2017-08-30 NOTE — Progress Notes (Signed)
Subjective:    CC: BP, abdominal pain   HPI:  Patient comes in today with a couple of different concerns.  First she has a stye on her right upper eyelid.  She ended up going to the emergency department for further evaluation.  They put her on some drops and she has been doing some warm compresses.  It has not really changed but has not gotten worse either.  It is a little bit tender.  She also recently had some lab work done and her chloride was off by about one point.  She does want to make sure that it was not concerning or significant.  In regards to her mood.  She is still not seeing a therapist or counselor currently.  Overall she does feel positive about things.  She is been spending a lot of time with her granddaughter who she loves very much and this is been a very positive thing for her.  She does get frustrated at her parents as they often times seem to leave her.  She also reports that yesterday she had an  episode of lightheadedness and dizziness.   Hypertension- Pt denies chest pain, SOB, dizziness, or heart palpitations.  Taking meds as directed w/o problems.  Denies medication side effects.    She is having some pressure in her upper right and left quadrants. It feels tight at time.  No nausea vomiting or diarrhea.  She says she has been moving her bowels really well.  She is concerned that it may be the metoprolol causing this discomfort.  She switched from regular release to the extended release version.   Past medical history, Surgical history, Family history not pertinant except as noted below, Social history, Allergies, and medications have been entered into the medical record, reviewed, and corrections made.   Review of Systems: No fevers, chills, night sweats, weight loss, chest pain, or shortness of breath.   Objective:    General: Well Developed, well nourished, and in no acute distress.  Neuro: Alert and oriented x3, extra-ocular muscles intact, sensation grossly  intact.  HEENT: Normocephalic, atraumatic  Skin: Warm and dry, no rashes. Cardiac: Regular rate and rhythm, no murmurs rubs or gallops, no lower extremity edema.  Respiratory: Clear to auscultation bilaterally. Not using accessory muscles, speaking in full sentences.   Impression and Recommendations:    HTN  - Well controlled. Continue current regimen. Follow up in  2-3 months.    Right upper eyelid hordeolum - continue with compresses and drops. F/U with eye doc if not better in 2 weeks.    Elevated chloride. - Gave reassurance that is it ok.    Upper abdominal pain-just gave reassurance.  I really think it is bloating and is pushing up on her diaphragm.  She has lost 2 pounds that she was last here which is great.  I really want her to continue to work on this.  Did encourage her to consider restarting a probiotic.  Gets worse and she can give [place] a call back.  Mood - encouraged her to see a therapist and counselor.

## 2017-08-31 ENCOUNTER — Telehealth: Payer: Self-pay

## 2017-08-31 ENCOUNTER — Telehealth: Payer: Self-pay | Admitting: *Deleted

## 2017-08-31 ENCOUNTER — Encounter: Payer: Self-pay | Admitting: Family Medicine

## 2017-08-31 ENCOUNTER — Encounter: Payer: Self-pay | Admitting: General Practice

## 2017-08-31 DIAGNOSIS — C519 Malignant neoplasm of vulva, unspecified: Secondary | ICD-10-CM | POA: Diagnosis not present

## 2017-08-31 DIAGNOSIS — C541 Malignant neoplasm of endometrium: Secondary | ICD-10-CM | POA: Diagnosis not present

## 2017-08-31 NOTE — Telephone Encounter (Signed)
Per request from [facility], faxed the records to the office. Medical release sent

## 2017-08-31 NOTE — Telephone Encounter (Signed)
Patient calling in requesting to be switched back to Metoprolol Tartrate 50 BID. Patient states she's calling from her OBGYN this morning and her BP is 140/100 - she feels like the Metoprolol Succinate XL 100 mg is not working.  Patient advised message would be forwarded to PCP and she will be contacted once PCP responds.

## 2017-08-31 NOTE — Telephone Encounter (Signed)
Patient called with several questions regarding appointment here at [facility], transportation and additional resources.   Gave her the numbers for scheduling, Impact, and social worker here per her request.

## 2017-08-31 NOTE — Progress Notes (Signed)
[place] CSW Progress Note  Patient called, needs help w transportation to upcoming appointment at [facility] on 2/8.  Reviewed options w patient - she is eligible for transportation help as member benefit from [facility].  CSW verified eligibility w provider.  Pt informed she needs to call [employer] to schedule rides to appointments - is eligible for up to 12 one way trips of 25 miles or less/year.  Brochure mailed w contact information for [employer].  Edwyna Shell, LCSW Clinical Social Worker Phone:  [phone]

## 2017-09-03 MED ORDER — METOPROLOL TARTRATE 100 MG PO TABS: 100.0000 mg | ORAL_TABLET | Freq: Two times a day (BID) | ORAL | 0 refills | Status: DC

## 2017-09-03 NOTE — Telephone Encounter (Signed)
I sent over new script and sent a my Chart message.

## 2017-09-05 DIAGNOSIS — R079 Chest pain, unspecified: Secondary | ICD-10-CM | POA: Diagnosis not present

## 2017-09-05 DIAGNOSIS — R0789 Other chest pain: Secondary | ICD-10-CM | POA: Diagnosis not present

## 2017-09-05 DIAGNOSIS — R1010 Upper abdominal pain, unspecified: Secondary | ICD-10-CM | POA: Diagnosis not present

## 2017-09-05 DIAGNOSIS — M7989 Other specified soft tissue disorders: Secondary | ICD-10-CM | POA: Diagnosis not present

## 2017-09-05 DIAGNOSIS — R0602 Shortness of breath: Secondary | ICD-10-CM | POA: Diagnosis not present

## 2017-09-05 DIAGNOSIS — R002 Palpitations: Secondary | ICD-10-CM | POA: Diagnosis not present

## 2017-09-05 DIAGNOSIS — R5383 Other fatigue: Secondary | ICD-10-CM | POA: Diagnosis not present

## 2017-09-06 DIAGNOSIS — R079 Chest pain, unspecified: Secondary | ICD-10-CM | POA: Diagnosis not present

## 2017-09-08 DIAGNOSIS — I10 Essential (primary) hypertension: Secondary | ICD-10-CM | POA: Diagnosis not present

## 2017-09-08 DIAGNOSIS — Z91018 Allergy to other foods: Secondary | ICD-10-CM | POA: Diagnosis not present

## 2017-09-08 DIAGNOSIS — M25531 Pain in right wrist: Secondary | ICD-10-CM | POA: Diagnosis not present

## 2017-09-08 DIAGNOSIS — G35 Multiple sclerosis: Secondary | ICD-10-CM | POA: Diagnosis not present

## 2017-09-08 DIAGNOSIS — E669 Obesity, unspecified: Secondary | ICD-10-CM | POA: Diagnosis not present

## 2017-09-08 DIAGNOSIS — M7989 Other specified soft tissue disorders: Secondary | ICD-10-CM | POA: Diagnosis not present

## 2017-09-08 DIAGNOSIS — K219 Gastro-esophageal reflux disease without esophagitis: Secondary | ICD-10-CM | POA: Diagnosis not present

## 2017-09-08 DIAGNOSIS — Z883 Allergy status to other anti-infective agents status: Secondary | ICD-10-CM | POA: Diagnosis not present

## 2017-09-08 DIAGNOSIS — M79601 Pain in right arm: Secondary | ICD-10-CM | POA: Diagnosis not present

## 2017-09-08 DIAGNOSIS — Z87891 Personal history of nicotine dependence: Secondary | ICD-10-CM | POA: Diagnosis not present

## 2017-09-08 DIAGNOSIS — E785 Hyperlipidemia, unspecified: Secondary | ICD-10-CM | POA: Diagnosis not present

## 2017-09-10 ENCOUNTER — Encounter: Payer: Self-pay | Admitting: Physician Assistant

## 2017-09-10 ENCOUNTER — Telehealth: Payer: Self-pay | Admitting: Family Medicine

## 2017-09-10 ENCOUNTER — Ambulatory Visit (INDEPENDENT_AMBULATORY_CARE_PROVIDER_SITE_OTHER): Payer: Medicare HMO | Admitting: Physician Assistant

## 2017-09-10 VITALS — BP 145/91 | HR 84

## 2017-09-10 DIAGNOSIS — G5601 Carpal tunnel syndrome, right upper limb: Secondary | ICD-10-CM | POA: Diagnosis not present

## 2017-09-10 DIAGNOSIS — M25511 Pain in right shoulder: Secondary | ICD-10-CM | POA: Diagnosis not present

## 2017-09-10 DIAGNOSIS — R7303 Prediabetes: Secondary | ICD-10-CM

## 2017-09-10 DIAGNOSIS — G8929 Other chronic pain: Secondary | ICD-10-CM | POA: Diagnosis not present

## 2017-09-10 LAB — POCT GLYCOSYLATED HEMOGLOBIN (HGB A1C): Hemoglobin A1C: 6.2

## 2017-09-10 MED ORDER — GABAPENTIN 100 MG PO CAPS: 100.0000 mg | ORAL_CAPSULE | Freq: Every day | ORAL | 0 refills | Status: DC

## 2017-09-10 MED ORDER — MELOXICAM 15 MG PO TABS: 15.0000 mg | ORAL_TABLET | Freq: Every day | ORAL | 0 refills | Status: DC

## 2017-09-10 MED ORDER — ACETAMINOPHEN ER 650 MG PO TBCR: 1300.0000 mg | EXTENDED_RELEASE_TABLET | Freq: Three times a day (TID) | ORAL | 0 refills | Status: DC | PRN
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
## 2017-09-10 NOTE — Patient Instructions (Signed)
For your shoulder pain: - stop Ibuprofen - take Meloxicam 1 tablet with breakfast - take Tylenol Arthritis 1300 mg every 8 hours as needed for pain (max dose 4g daily) - take Gabapentin at bedtime. Start with 1 tablet, increase as needed to 3 tablets - physical therapy - follow-up with Dr. Georgina Snell in 4 weeks  For your wrist pain: - night time splinting - physical therapy   Prediabetes Eating Plan Prediabetes-also called impaired glucose tolerance or impaired fasting glucose-is a condition that causes blood sugar (blood glucose) levels to be higher than normal. Following a healthy diet can help to keep prediabetes under control. It can also help to lower the risk of type 2 diabetes and heart disease, which are increased in people who have prediabetes. Along with regular exercise, a healthy diet:  Promotes weight loss.  Helps to control blood sugar levels.  Helps to improve the way that the body uses insulin.  What do I need to know about this eating plan?  Use the glycemic index (GI) to plan your meals. The index tells you how quickly a food will raise your blood sugar. Choose low-GI foods. These foods take a longer time to raise blood sugar.  Pay close attention to the amount of carbohydrates in the food that you eat. Carbohydrates increase blood sugar levels.  Keep track of how many calories you take in. Eating the right amount of calories will help you to achieve a healthy weight. Losing about 7 percent of your starting weight can help to prevent type 2 diabetes.  You may want to follow a Mediterranean diet. This diet includes a lot of vegetables, lean meats or fish, whole grains, fruits, and healthy oils and fats. What foods can I eat? Grains Whole grains, such as whole-wheat or whole-grain breads, crackers, cereals, and pasta. Unsweetened oatmeal. Bulgur. Barley. Quinoa. Brown rice. Corn or whole-wheat flour tortillas or taco shells. Vegetables Lettuce. Spinach. Peas. Beets.  Cauliflower. Cabbage. Broccoli. Carrots. Tomatoes. Squash. Eggplant. Herbs. Peppers. Onions. Cucumbers. Brussels sprouts. Fruits Berries. Bananas. Apples. Oranges. Grapes. Papaya. Mango. Pomegranate. Kiwi. Grapefruit. Cherries. Meats and Other Protein Sources Seafood. Lean meats, such as chicken and [place] or lean cuts of pork and beef. Tofu. Eggs. Nuts. Beans. Dairy Low-fat or fat-free dairy products, such as yogurt, cottage cheese, and cheese. Beverages Water. Tea. Coffee. Sugar-free or diet soda. Seltzer water. Milk. Milk alternatives, such as soy or almond milk. Condiments Mustard. Relish. Low-fat, low-sugar ketchup. Low-fat, low-sugar barbecue sauce. Low-fat or fat-free mayonnaise. Sweets and Desserts Sugar-free or low-fat pudding. Sugar-free or low-fat ice cream and other frozen treats. Fats and Oils Avocado. Walnuts. Olive oil. The items listed above may not be a complete list of recommended foods or beverages. Contact your dietitian for more options. What foods are not recommended? Grains Refined white flour and flour products, such as bread, pasta, snack foods, and cereals. Beverages Sweetened drinks, such as sweet iced tea and soda. Sweets and Desserts Baked goods, such as cake, cupcakes, pastries, cookies, and cheesecake. The items listed above may not be a complete list of foods and beverages to avoid. Contact your dietitian for more information. This information is not intended to replace advice given to you by your health care provider. Make sure you discuss any questions you have with your health care provider. Document Released: 12/15/2014 Document Revised: 01/06/2016 Document Reviewed: 08/26/2014 Elsevier Interactive Patient Education  2017 [employer].

## 2017-09-10 NOTE — Telephone Encounter (Signed)
Jennifer [name] states she needs you to call her today with a question about her medicine and said she needs to talk to you  About how to take it because you will probably remember the conversation previously had about meds.

## 2017-09-10 NOTE — Telephone Encounter (Signed)
Pt came in stating that she is unhappy with the nutritionist she was previously referred to. She stated that they lost her paperwork at first and now they are requiring her to call her insurance with various diagnosis codes to see if the particular diagnoses are covered by her insurance. Pt does not feel comfortable doing this and expected the doctor's office to do this for her. She is requesting that she be referred to a different nutritionist and would like one closer to [place] if one is available. Please advise. Thanks!

## 2017-09-11 ENCOUNTER — Encounter: Payer: Self-pay | Admitting: Family Medicine

## 2017-09-11 ENCOUNTER — Other Ambulatory Visit: Payer: Self-pay | Admitting: Family Medicine

## 2017-09-11 ENCOUNTER — Ambulatory Visit (INDEPENDENT_AMBULATORY_CARE_PROVIDER_SITE_OTHER): Payer: Medicare HMO | Admitting: Family Medicine

## 2017-09-11 DIAGNOSIS — M25511 Pain in right shoulder: Secondary | ICD-10-CM

## 2017-09-11 NOTE — Telephone Encounter (Signed)
Called pt back and she stated that per cardiology they did not want her to go up to metoprolol 100 mg BID. She stated that if everyone is agreeable to this is ok with the change. She stated she just wants to get a better understanding. She feels that when her HR is too low she feels bad.   Advised pt to call cardiology to see what they advise for her to do. She states that she currently is taking the 50 mg in the AM and 50 mg in the PM. Will fwd to pcp for FYI.Elouise Munroe, [place]

## 2017-09-11 NOTE — Patient Instructions (Signed)
You have rotator cuff impingement with radial nerve irritation based on distribution. Try to avoid painful activities (overhead activities, lifting with extended arm) as much as possible. Tylenol 500mg  1-2 tabs three times a day as needed for pain. Consider taking the meloxicam as we discussed.  Otherwise ibuprofen is ok but don't take both of them. Salon pas patches may help you with the pain. I think you'll get better without starting the home exercises but if over next 1-2 weeks this persists use yellow theraband and start low (1 set of 5) and work way up ideally to 3 sets of 10 once a day of each exercise. Follow up with me in 1 month or as needed.

## 2017-09-12 ENCOUNTER — Encounter: Payer: Self-pay | Admitting: Family Medicine

## 2017-09-12 NOTE — Assessment & Plan Note (Signed)
consistent with rotator cuff impingement.  Tylenol up to three times a day.  She was given meloxicam at PCPs office and written for PT but states she does not want to do these - I advised her she needs to inform providers when this is the case so treatment program can be altered.  She has several allergies making options limited.  Encouraged to try salon pas patches, will consider filling the meloxicam.  We went over a very basic strengthening program with yellow theraband she could try but expect she will improve over 1-2 weeks.  F/u in 1 month or prn.
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
## 2017-09-13 ENCOUNTER — Ambulatory Visit (INDEPENDENT_AMBULATORY_CARE_PROVIDER_SITE_OTHER): Payer: Medicare HMO | Admitting: Family Medicine

## 2017-09-13 ENCOUNTER — Encounter: Payer: Self-pay | Admitting: Family Medicine

## 2017-09-13 VITALS — BP 158/62 | HR 79 | Ht 62.0 in | Wt 215.0 lb

## 2017-09-13 DIAGNOSIS — M25511 Pain in right shoulder: Secondary | ICD-10-CM

## 2017-09-13 DIAGNOSIS — G8929 Other chronic pain: Secondary | ICD-10-CM | POA: Diagnosis not present

## 2017-09-13 DIAGNOSIS — R7303 Prediabetes: Secondary | ICD-10-CM | POA: Diagnosis not present

## 2017-09-13 DIAGNOSIS — I10 Essential (primary) hypertension: Secondary | ICD-10-CM | POA: Diagnosis not present

## 2017-09-13 DIAGNOSIS — Z6839 Body mass index (BMI) 39.0-39.9, adult: Secondary | ICD-10-CM | POA: Diagnosis not present

## 2017-09-13 LAB — GLUCOSE, POCT (MANUAL RESULT ENTRY): POC Glucose: 95 mg/dl (ref 70–99)
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
## 2017-09-13 NOTE — Patient Instructions (Signed)
We will work on trying to redo your nutrition referral. Continue to do your band exercises. I want to see you back and retest your glucose, A1c and 3 months.

## 2017-09-14 ENCOUNTER — Telehealth: Payer: Self-pay | Admitting: Family Medicine

## 2017-09-14 DIAGNOSIS — M25431 Effusion, right wrist: Secondary | ICD-10-CM | POA: Diagnosis not present

## 2017-09-14 DIAGNOSIS — M24111 Other articular cartilage disorders, right shoulder: Secondary | ICD-10-CM | POA: Diagnosis not present

## 2017-09-14 DIAGNOSIS — M25511 Pain in right shoulder: Secondary | ICD-10-CM | POA: Diagnosis not present

## 2017-09-14 NOTE — Telephone Encounter (Signed)
Patient calling regarding her right shoulder pain. States pain has become a lot worse. She went to the ED at [place] last night.  She said the pain has been making her sick and unable to sleep. She is unable to put her hand behind her back and is having difficulty getting dressed and turning the wheel when driving. Patient also stated that her fingers started to swell yesterday and are still swollen this morning.

## 2017-09-14 NOTE — Telephone Encounter (Signed)
Left message

## 2017-09-14 NOTE — Telephone Encounter (Signed)
I discussed this with her when she was here but the problem is because of her many allergies and intolerances there isn't much else we can do from a treatment perspective.  Typically we would consider a cortisone injection, physical therapy, nitro patches, other anti-inflammatory or prednisone.  She cannot take cortisone injection or prednisone due to allergy, does not want to take meloxicam and is instead taking ibuprofen.  She declined physical therapy.  Nitro patches carry risk of headaches.  Advanced imaging would only be indicated if she had something surgical which based on her examination she does not.

## 2017-09-14 NOTE — Telephone Encounter (Signed)
So we could do this one of two ways then - she could inquire about the injection with her doctor in [place] - maybe they have a record of which steroid she tolerated.  Or we could order the MRI.  Level of pain does not indicate that it's something surgical though.  Typically a severe loss of strength from an acute injury would indicate this but if she would like to do MRI we could order that.

## 2017-09-14 NOTE — Telephone Encounter (Signed)
Patient was given information. She states she had a type on injection done previously with her doctor in [place], she stated these helped. Patient is going to contact office in [place] and find out the type of injection.   Patient is interested in MRI. She states she thinks it could be surgical due to this being the worst pain she has experienced in her shoulder. Patient says she has always had issues with discomfort but nothing like she is feeling now. Patient does not see her shoulder getting better on its on.   Patient was asked about physical therapy and she is concerned with the pain being too much to handle at these visits.

## 2017-09-15 DIAGNOSIS — C55 Malignant neoplasm of uterus, part unspecified: Secondary | ICD-10-CM | POA: Insufficient documentation

## 2017-09-18 ENCOUNTER — Telehealth: Payer: Self-pay

## 2017-09-18 ENCOUNTER — Encounter: Payer: Self-pay | Admitting: Physician Assistant

## 2017-09-18 DIAGNOSIS — G8929 Other chronic pain: Secondary | ICD-10-CM | POA: Insufficient documentation

## 2017-09-18 DIAGNOSIS — R7303 Prediabetes: Secondary | ICD-10-CM | POA: Insufficient documentation

## 2017-09-18 DIAGNOSIS — M25511 Pain in right shoulder: Principal | ICD-10-CM

## 2017-09-18 DIAGNOSIS — G5601 Carpal tunnel syndrome, right upper limb: Secondary | ICD-10-CM | POA: Insufficient documentation

## 2017-09-18 MED ORDER — TOPIRAMATE 25 MG PO TABS: 25.0000 mg | ORAL_TABLET | Freq: Every day | ORAL | 0 refills | Status: DC

## 2017-09-18 NOTE — Telephone Encounter (Signed)
Jennifer [name] wants to start a new medication Topamax. She wants it for headaches, weight loss, blood pressure and seizures. Mainly for weight loss.

## 2017-09-18 NOTE — Telephone Encounter (Signed)
Ok new rx sent to pharmacy.

## 2017-09-19 DIAGNOSIS — E876 Hypokalemia: Secondary | ICD-10-CM | POA: Diagnosis not present

## 2017-09-19 LAB — BASIC METABOLIC PANEL
BUN: 15 (ref 4–21)
Creatinine: 0.6 (ref 0.5–1.1)
Glucose: 96
Potassium: 3.9 (ref 3.4–5.3)
Sodium: 140 (ref 137–147)

## 2017-09-19 NOTE — Telephone Encounter (Signed)
Patient advised.

## 2017-09-20 ENCOUNTER — Telehealth: Payer: Self-pay | Admitting: *Deleted

## 2017-09-20 ENCOUNTER — Encounter: Payer: Self-pay | Admitting: Family Medicine

## 2017-09-20 ENCOUNTER — Ambulatory Visit (INDEPENDENT_AMBULATORY_CARE_PROVIDER_SITE_OTHER): Payer: Medicare HMO | Admitting: Family Medicine

## 2017-09-20 VITALS — BP 165/89 | HR 90 | Temp 98.5°F | Ht 62.0 in | Wt 215.0 lb

## 2017-09-20 DIAGNOSIS — J029 Acute pharyngitis, unspecified: Secondary | ICD-10-CM | POA: Diagnosis not present

## 2017-09-20 DIAGNOSIS — J0100 Acute maxillary sinusitis, unspecified: Secondary | ICD-10-CM

## 2017-09-20 NOTE — Telephone Encounter (Signed)
Patient called and moved her appt from tomorrow to March 29th

## 2017-09-20 NOTE — Progress Notes (Signed)
   Subjective:    Patient ID: Jennifer [name], female    DOB: May 15, 1966, 52 y.o.   MRN: [id number]  HPI 1 week of nasal congestion and pressure underneath her eyes as well as sore throat particularly on the right side of her throat.  She has had some sick contacts within her home.  No fevers chills or sweats.Pressure under both eyes.  She has been using her nasal saline rinse.  She feels like her throat is a little thick at times.  She also feels like her ears have been bothering her.  She has felt a little dizzy at times too.    In regards to the sore throat over the weekend she felt like some food or something got stuck on the right side of that throat.  Is just been irritated and sore ever since then.  She is also had an issue with cryptic tonsils and tonsillar stones in the past and is noticing a bad odor to her breath.  Review of Systems     Objective:   Physical Exam  Constitutional: She is oriented to person, place, and time. She appears well-developed and well-nourished.  HENT:  Head: Normocephalic and atraumatic.  Right Ear: External ear normal.  Left Ear: External ear normal.  Nose: Nose normal.  Mouth/Throat: Oropharynx is clear and moist.  TMs and canals are clear.   Eyes: Conjunctivae and EOM are normal. Pupils are equal, round, and reactive to light.  Neck: Neck supple. No thyromegaly present.  Cardiovascular: Normal rate, regular rhythm and normal heart sounds.  Pulmonary/Chest: Effort normal and breath sounds normal. She has no wheezes.  Lymphadenopathy:    She has no cervical adenopathy.  Neurological: She is alert and oriented to person, place, and time.  Skin: Skin is warm and dry.  Psychiatric: She has a normal mood and affect.          Assessment & Plan:  Acute sinusitis-viral versus bacterial.  At this point she has had symptoms for a week.  I had previously sent in a prescription earlier in January which she did pick up, for cefixime based on her current  drug list allergies.  She actually ended up not taking the medication because she started to feel better.  Encouraged her to give it a couple more days with her symptoms to see if she starts to improve before filling the antibiotic taking it. Continue nasal saline.   Right-sided sore throat-unclear etiology.  I did not see a tonsillar stone on exam but she says sometimes are little more hidden.  I did encourage her not to try to remove it or pick at it.  This can actually cause an infection.  Just continue with salt water gargles and typically these will loosen on their own.  So consider that the bad breath could be coming from sinus discharge.

## 2017-09-20 NOTE — Patient Instructions (Addendum)
Ok to use the Cefexime this weekend if you are not getting better.     Sinusitis, Adult Sinusitis is soreness and inflammation of your sinuses. Sinuses are hollow spaces in the bones around your face. Your sinuses are located:  Around your eyes.  In the middle of your forehead.  Behind your nose.  In your cheekbones.  Your sinuses and nasal passages are lined with a stringy fluid (mucus). Mucus normally drains out of your sinuses. When your nasal tissues become inflamed or swollen, the mucus can become trapped or blocked so air cannot flow through your sinuses. This allows bacteria, viruses, and funguses to grow, which leads to infection. Sinusitis can develop quickly and last for 7?10 days (acute) or for more than 12 weeks (chronic). Sinusitis often develops after a cold. What are the causes? This condition is caused by anything that creates swelling in the sinuses or stops mucus from draining, including:  Allergies.  Asthma.  Bacterial or viral infection.  Abnormally shaped bones between the nasal passages.  Nasal growths that contain mucus (nasal polyps).  Narrow sinus openings.  Pollutants, such as chemicals or irritants in the air.  A foreign object stuck in the nose.  A fungal infection. This is rare.  What increases the risk? The following factors may make you more likely to develop this condition:  Having allergies or asthma.  Having had a recent cold or respiratory tract infection.  Having structural deformities or blockages in your nose or sinuses.  Having a weak immune system.  Doing a lot of swimming or diving.  Overusing nasal sprays.  Smoking.  What are the signs or symptoms? The main symptoms of this condition are pain and a feeling of pressure around the affected sinuses. Other symptoms include:  Upper toothache.  Earache.  Headache.  Bad breath.  Decreased sense of smell and taste.  A cough that may get worse at  night.  Fatigue.  Fever.  Thick drainage from your nose. The drainage is often green and it may contain pus (purulent).  Stuffy nose or congestion.  Postnasal drip. This is when extra mucus collects in the throat or back of the nose.  Swelling and warmth over the affected sinuses.  Sore throat.  Sensitivity to light.  How is this diagnosed? This condition is diagnosed based on symptoms, a medical history, and a physical exam. To find out if your condition is acute or chronic, your health care provider may:  Look in your nose for signs of nasal polyps.  Tap over the affected sinus to check for signs of infection.  View the inside of your sinuses using an imaging device that has a light attached (endoscope).  If your health care provider suspects that you have chronic sinusitis, you may also:  Be tested for allergies.  Have a sample of mucus taken from your nose (nasal culture) and checked for bacteria.  Have a mucus sample examined to see if your sinusitis is related to an allergy.  If your sinusitis does not respond to treatment and it lasts longer than 8 weeks, you may have an MRI or CT scan to check your sinuses. These scans also help to determine how severe your infection is. In rare cases, a bone biopsy may be done to rule out more serious types of fungal sinus disease. How is this treated? Treatment for sinusitis depends on the cause and whether your condition is chronic or acute. If a virus is causing your sinusitis, your symptoms will  go away on their own within 10 days. You may be given medicines to relieve your symptoms, including:  Topical nasal decongestants. They shrink swollen nasal passages and let mucus drain from your sinuses.  Antihistamines. These drugs block inflammation that is triggered by allergies. This can help to ease swelling in your nose and sinuses.  Topical nasal corticosteroids. These are nasal sprays that ease inflammation and swelling in  your nose and sinuses.  Nasal saline washes. These rinses can help to get rid of thick mucus in your nose.  If your condition is caused by bacteria, you will be given an antibiotic medicine. If your condition is caused by a fungus, you will be given an antifungal medicine. Surgery may be needed to correct underlying conditions, such as narrow nasal passages. Surgery may also be needed to remove polyps. Follow these instructions at home: Medicines  Take, use, or apply over-the-counter and prescription medicines only as told by your health care provider. These may include nasal sprays.  If you were prescribed an antibiotic medicine, take it as told by your health care provider. Do not stop taking the antibiotic even if you start to feel better. Hydrate and Humidify  Drink enough water to keep your urine clear or pale yellow. Staying hydrated will help to thin your mucus.  Use a cool mist humidifier to keep the humidity level in your home above 50%.  Inhale steam for 10-15 minutes, 3-4 times a day or as told by your health care provider. You can do this in the bathroom while a hot shower is running.  Limit your exposure to cool or dry air. Rest  Rest as much as possible.  Sleep with your head raised (elevated).  Make sure to get enough sleep each night. General instructions  Apply a warm, moist washcloth to your face 3-4 times a day or as told by your health care provider. This will help with discomfort.  Wash your hands often with soap and water to reduce your exposure to viruses and other germs. If soap and water are not available, use hand sanitizer.  Do not smoke. Avoid being around people who are smoking (secondhand smoke).  Keep all follow-up visits as told by your health care provider. This is important. Contact a health care provider if:  You have a fever.  Your symptoms get worse.  Your symptoms do not improve within 10 days. Get help right away if:  You have a  severe headache.  You have persistent vomiting.  You have pain or swelling around your face or eyes.  You have vision problems.  You develop confusion.  Your neck is stiff.  You have trouble breathing. This information is not intended to replace advice given to you by your health care provider. Make sure you discuss any questions you have with your health care provider. Document Released: 07/31/2005 Document Revised: 03/26/2016 Document Reviewed: 05/26/2015 Elsevier Interactive Patient Education  [employer].

## 2017-09-21 ENCOUNTER — Ambulatory Visit: Payer: Self-pay | Admitting: Gynecology

## 2017-09-24 ENCOUNTER — Encounter: Payer: Self-pay | Admitting: Family Medicine

## 2017-09-25 ENCOUNTER — Emergency Department (HOSPITAL_BASED_OUTPATIENT_CLINIC_OR_DEPARTMENT_OTHER)
Admission: EM | Admit: 2017-09-25 | Discharge: 2017-09-25 | Disposition: A | Discharge status: Home / Self Care | Payer: Medicare HMO | Attending: Emergency Medicine | Admitting: Emergency Medicine

## 2017-09-25 ENCOUNTER — Other Ambulatory Visit: Payer: Self-pay

## 2017-09-25 ENCOUNTER — Encounter (HOSPITAL_BASED_OUTPATIENT_CLINIC_OR_DEPARTMENT_OTHER): Payer: Self-pay | Admitting: Emergency Medicine

## 2017-09-25 DIAGNOSIS — R0789 Other chest pain: Secondary | ICD-10-CM | POA: Insufficient documentation

## 2017-09-25 DIAGNOSIS — J45909 Unspecified asthma, uncomplicated: Secondary | ICD-10-CM | POA: Diagnosis not present

## 2017-09-25 DIAGNOSIS — I10 Essential (primary) hypertension: Secondary | ICD-10-CM | POA: Insufficient documentation

## 2017-09-25 DIAGNOSIS — M549 Dorsalgia, unspecified: Secondary | ICD-10-CM | POA: Diagnosis not present

## 2017-09-25 DIAGNOSIS — Z87891 Personal history of nicotine dependence: Secondary | ICD-10-CM | POA: Diagnosis not present

## 2017-09-25 DIAGNOSIS — R05 Cough: Secondary | ICD-10-CM | POA: Insufficient documentation

## 2017-09-25 DIAGNOSIS — Z79899 Other long term (current) drug therapy: Secondary | ICD-10-CM | POA: Insufficient documentation

## 2017-09-25 LAB — CBG MONITORING, ED: Glucose-Capillary: 71 mg/dL (ref 65–99)

## 2017-09-25 MED ORDER — ACETAMINOPHEN 500 MG PO TABS: 500.0000 mg | ORAL_TABLET | Freq: Once | ORAL | Status: DC

## 2017-09-25 NOTE — ED Triage Notes (Addendum)
Patient states that she is having pain to her right breast and through to her right upper back - patient states that she is also felling light head and dizzy - Also feels shaky and sweaty  - blood sugar was checked and patient was given peanut butter a crackers

## 2017-09-25 NOTE — Discharge Instructions (Signed)
Take Tylenol every 4 hours as needed for pain.  Keep your scheduled appointment with Dr.Metheney scheduled for 09/27/2017.  Ask Dr.Metheney to recheck your blood pressure
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
## 2017-09-26 ENCOUNTER — Telehealth: Payer: Self-pay | Admitting: Family Medicine

## 2017-09-26 ENCOUNTER — Other Ambulatory Visit: Payer: Self-pay | Admitting: *Deleted

## 2017-09-26 NOTE — Telephone Encounter (Signed)
[name],  Can you please call [name]  And update her on the nutrition referral because she states its been a while and she wants to go ahead and know and get started

## 2017-09-26 NOTE — Patient Outreach (Signed)
[facility]) Care Management  09/26/2017  TANIYA DASHER Jan 07, 1966 [id number]   Telephone Screen  Referral Date:  09/26/2017 Referral Source:  [facility] ED Census Reason for Referral:  6 or more ED visits in the past 6 months Insurance:  [employer]   Outreach Attempt:  Outreach attempt #1 to patient for telephone screening. No answer. RN Health Coach left HIPAA compliant voicemail message along with contact information.  Plan:  RN Health Coach will make another outreach attempt to patient within one business day.   [place] [phone] [email or web address]

## 2017-09-27 ENCOUNTER — Other Ambulatory Visit: Payer: Self-pay | Admitting: *Deleted

## 2017-09-27 ENCOUNTER — Ambulatory Visit (INDEPENDENT_AMBULATORY_CARE_PROVIDER_SITE_OTHER): Payer: Medicare HMO | Admitting: Family Medicine

## 2017-09-27 ENCOUNTER — Encounter: Payer: Self-pay | Admitting: Family Medicine

## 2017-09-27 VITALS — BP 150/82 | HR 76 | Ht 62.0 in | Wt 218.0 lb

## 2017-09-27 DIAGNOSIS — R7303 Prediabetes: Secondary | ICD-10-CM

## 2017-09-27 DIAGNOSIS — I10 Essential (primary) hypertension: Secondary | ICD-10-CM

## 2017-09-27 DIAGNOSIS — R0981 Nasal congestion: Secondary | ICD-10-CM | POA: Diagnosis not present

## 2017-09-27 DIAGNOSIS — M79621 Pain in right upper arm: Secondary | ICD-10-CM

## 2017-09-27 LAB — GLUCOSE, POCT (MANUAL RESULT ENTRY): POC Glucose: 97 mg/dl (ref 70–99)

## 2017-09-27 NOTE — Patient Outreach (Addendum)
[facility]) Care Management  09/27/2017  ERICIA MOXLEY Mar 06, 1966 [id number]   Telephone Screen  Referral Date:  09/26/2017 Referral Source:  [facility] ED Census Reason for Referral:  6 or more ED visits in the past 6 months Insurance:  [employer]   Outreach Attempt:   Outreach attempt #2 to patient for telephone screening.  Patient answered and immediately stated "I don't know how many times I have told y'all to take me off the call list, I am getting sick of it" and then hung up the phone.  Plan:  RN Health Coach will notify [place] Assistant of case closure status and patient's request not to be called in the future.  [place] [phone] [email or web address]
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
## 2017-09-28 IMAGING — DX DG CHEST 2V
2 series · 2 of 2 positions shown · non-contrast
Comparison: Radiographs June 25, 2016.

CLINICAL DATA: Cough, congestion.

EXAM:
CHEST  2 VIEW

[chest pa]
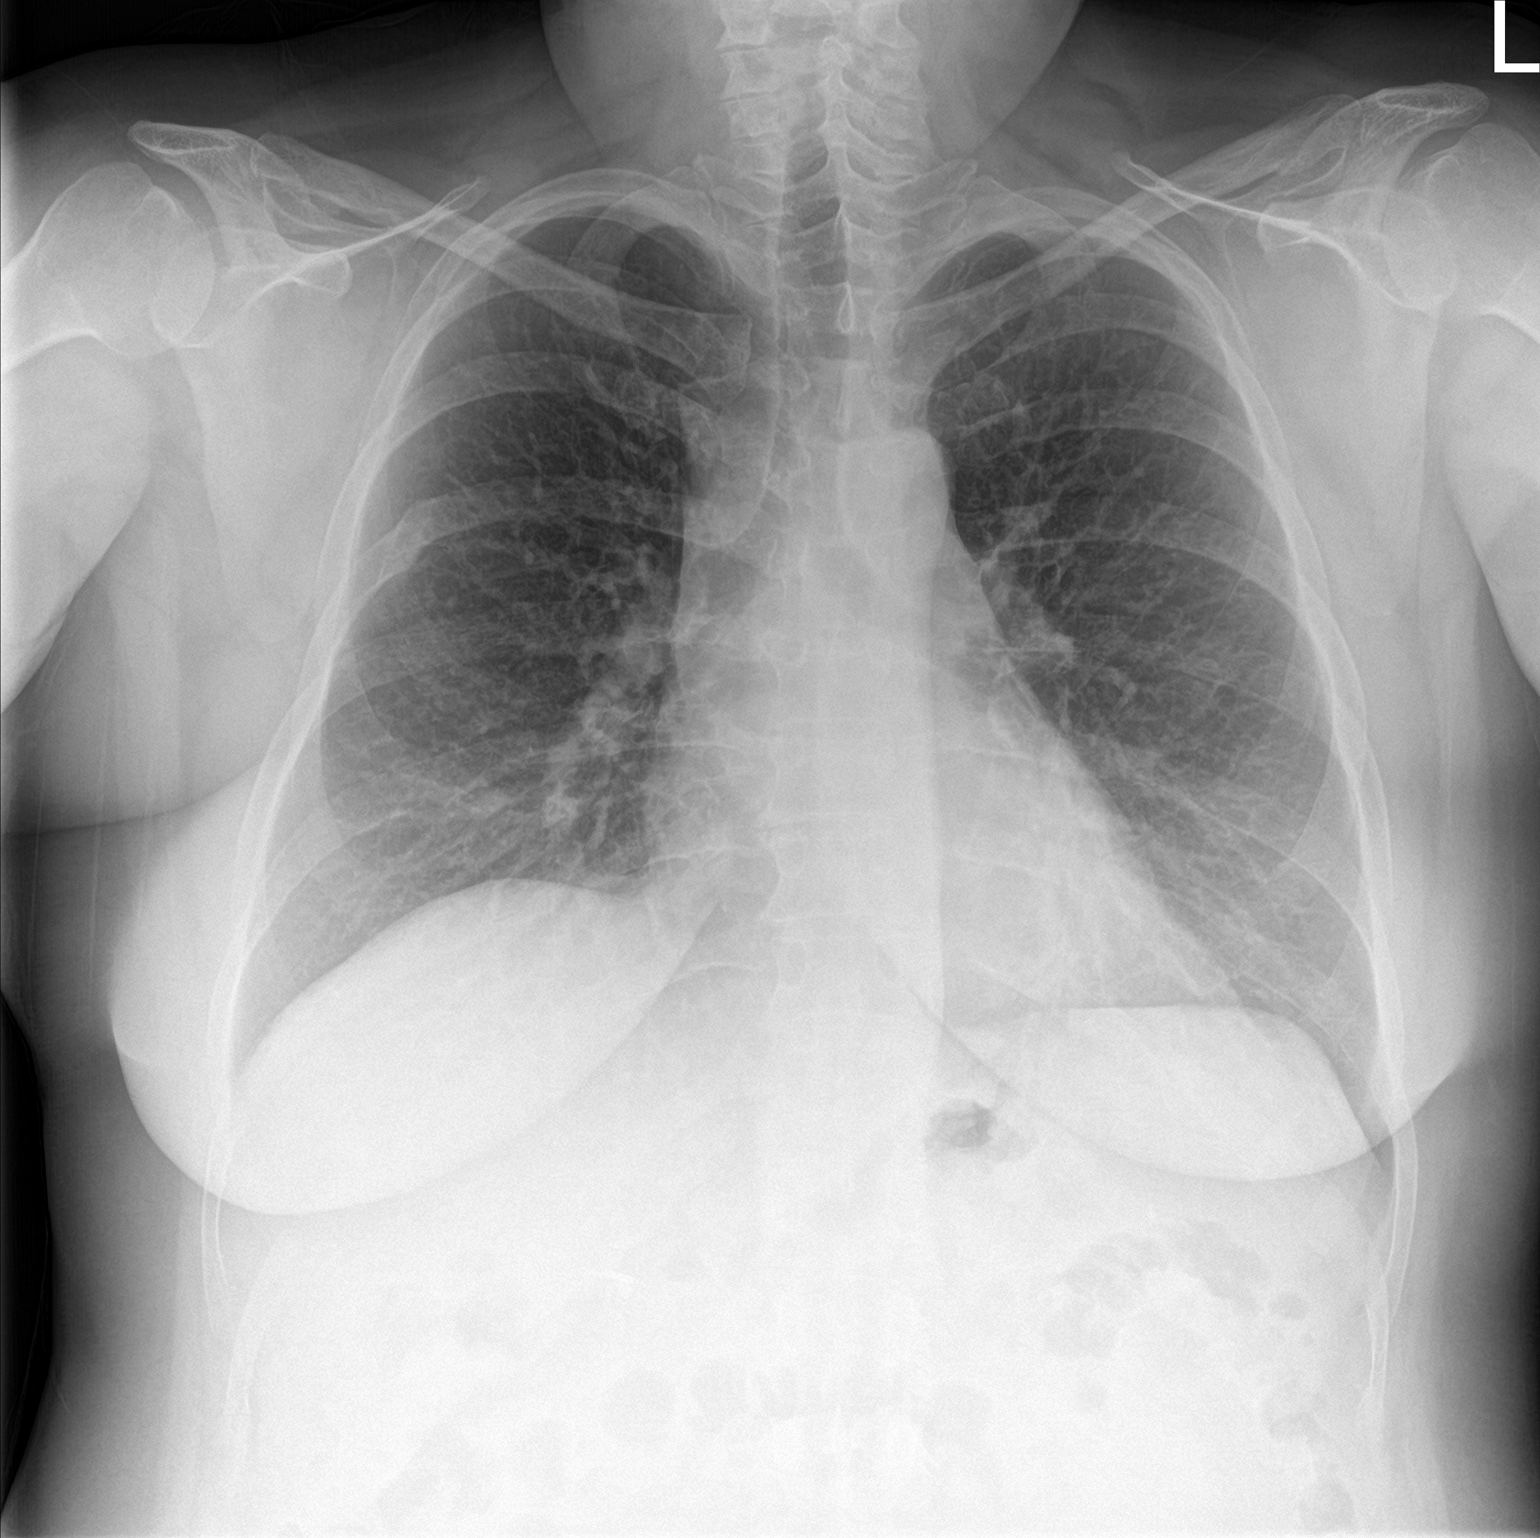

[chest lat]
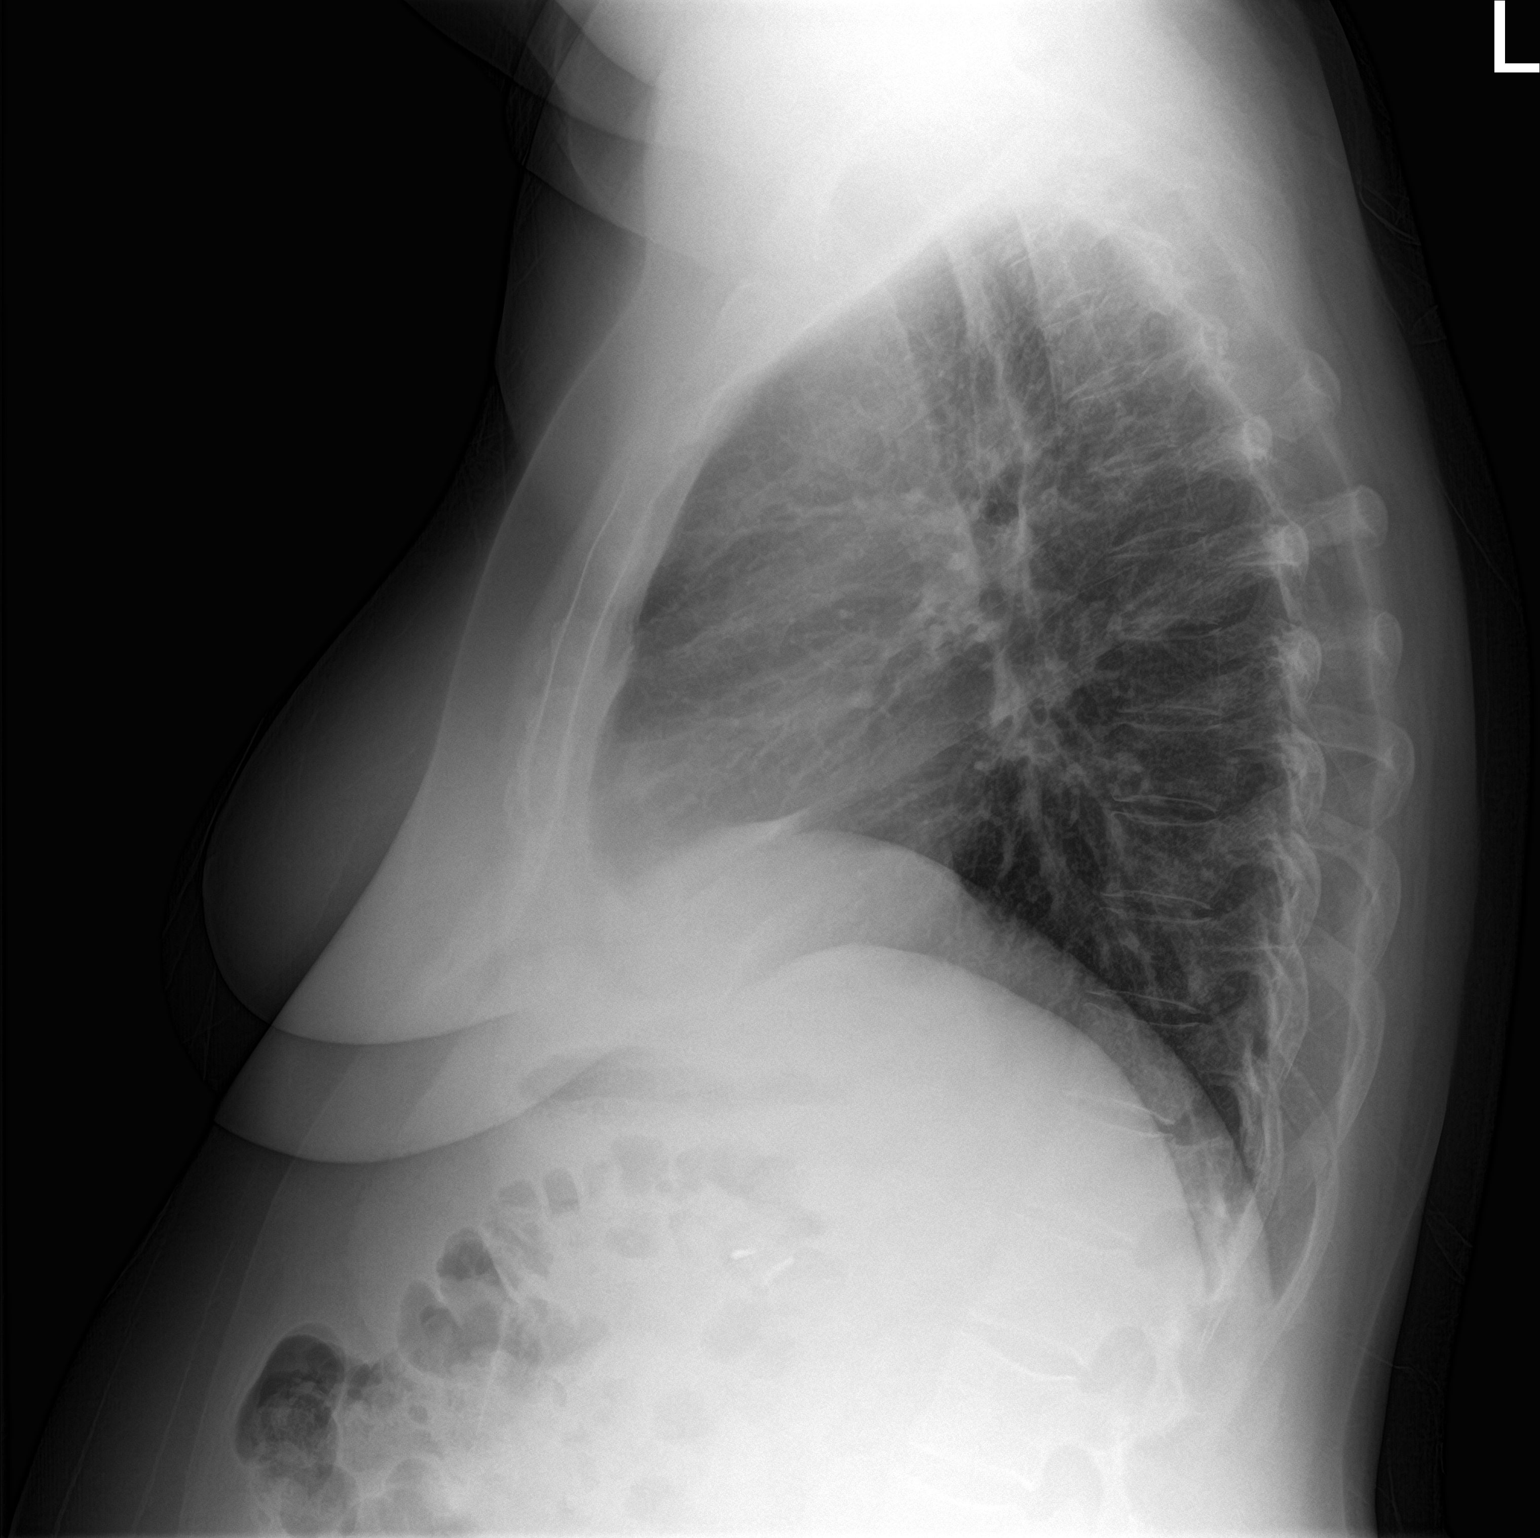

[2 of 2 positions shown; findings below may reference images not displayed]

FINDINGS: The heart size and mediastinal contours are within normal limits.
Both lungs are clear. No pneumothorax or pleural effusion is noted.
Old right rib fracture is noted.
IMPRESSION: No active cardiopulmonary disease.

## 2017-10-07 IMAGING — DX DG CHEST 2V
2 series · 2 of 2 positions shown · non-contrast
Comparison: 07/07/2016

CLINICAL DATA: Cough and shortness of breath.

EXAM:
CHEST  2 VIEW

[chest pa]
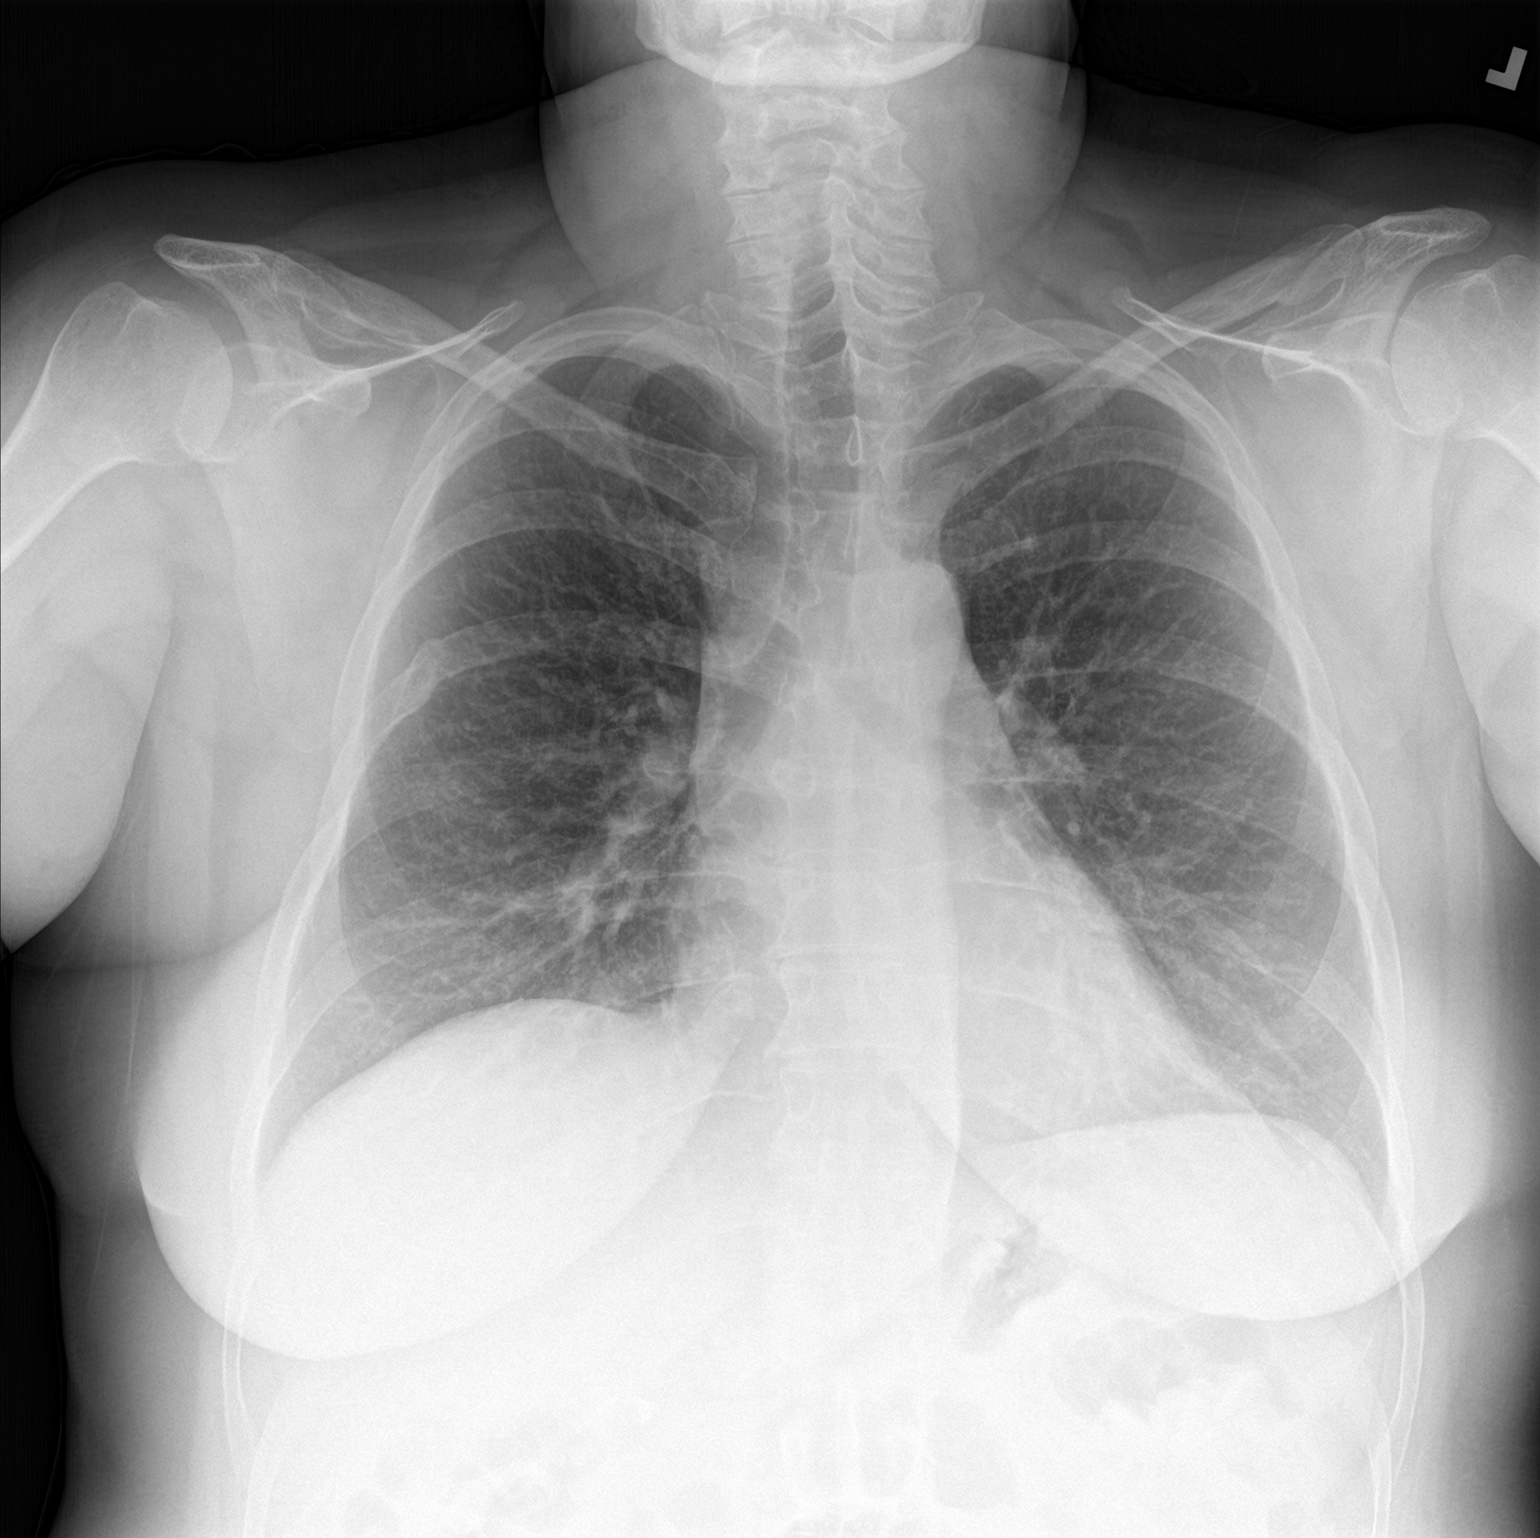

[chest lat]
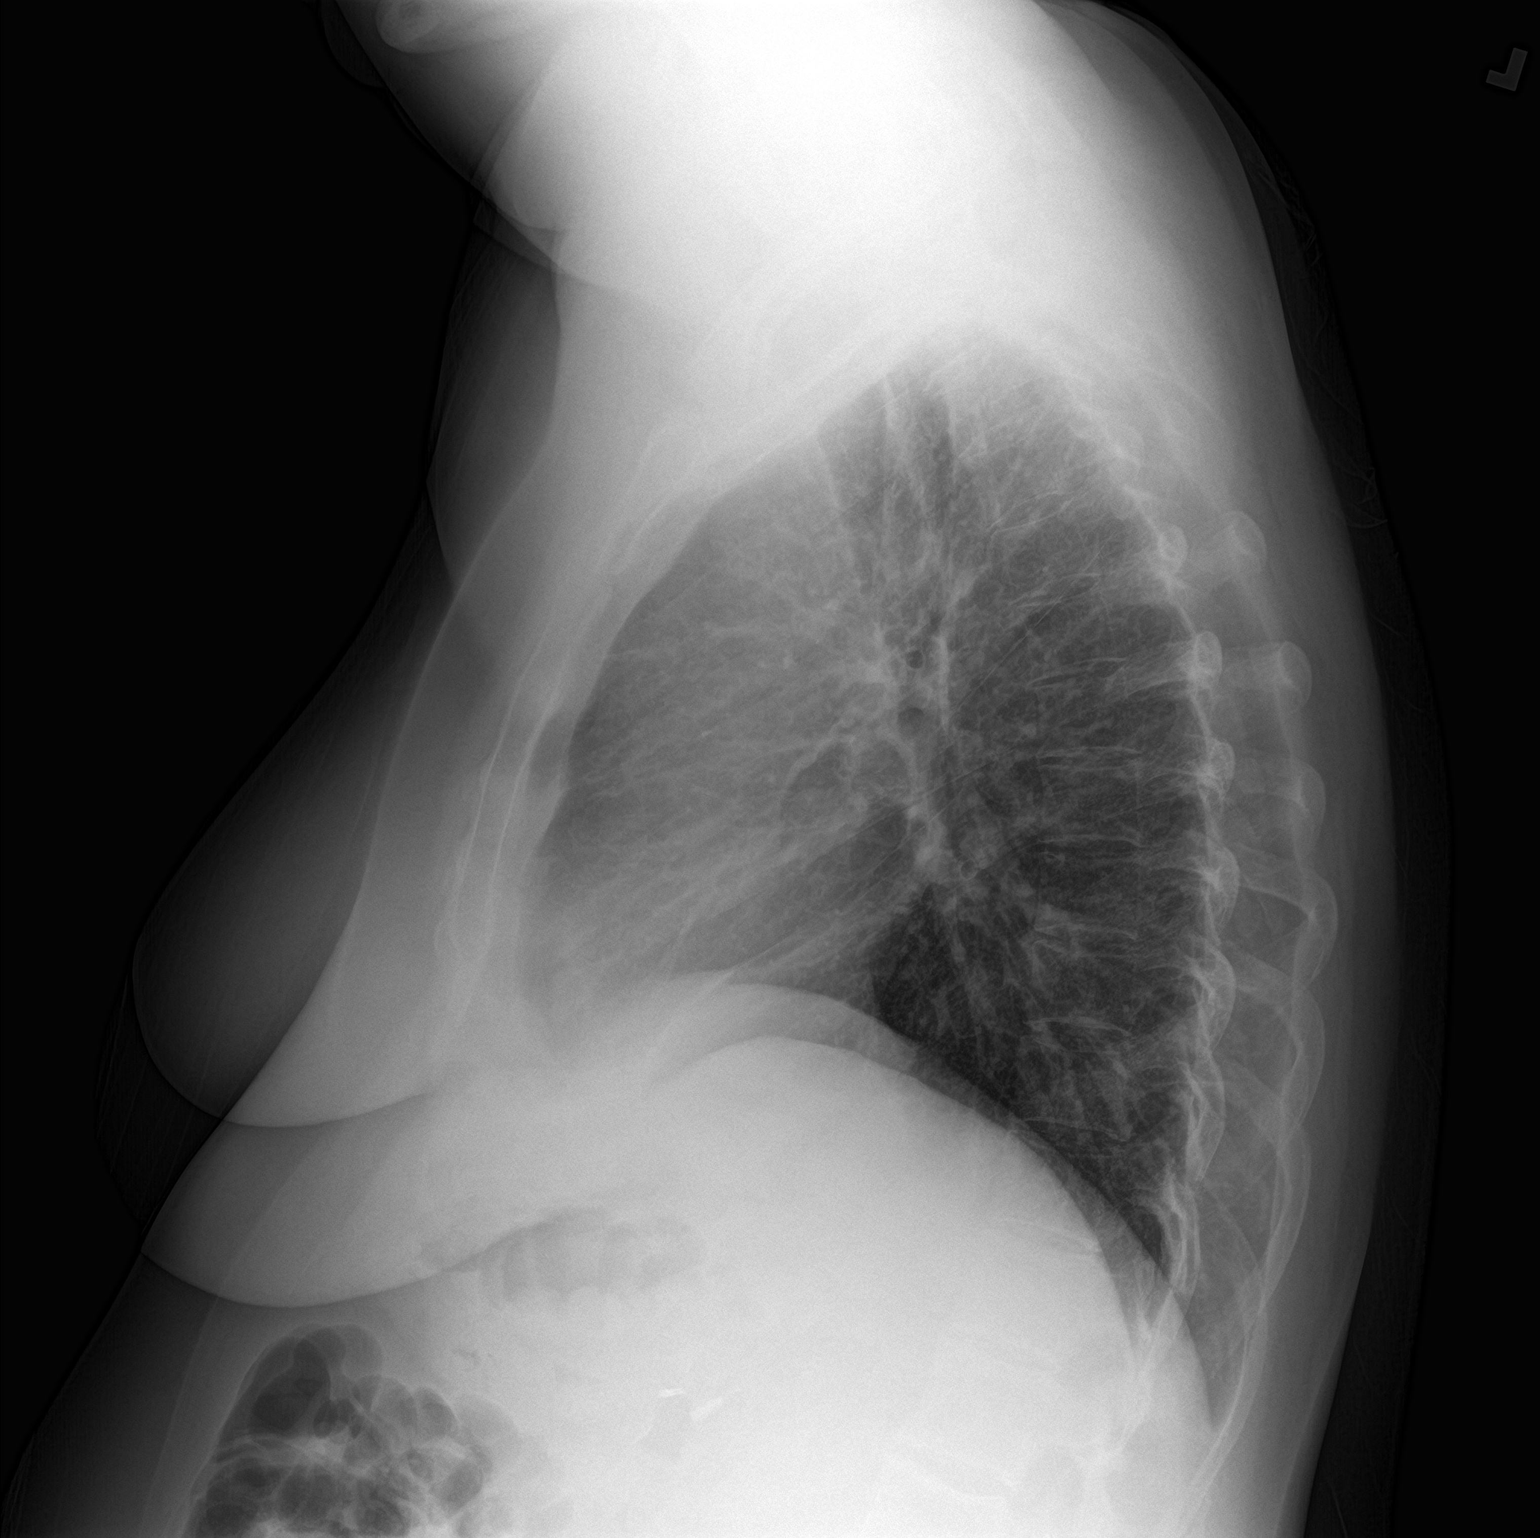

[2 of 2 positions shown; findings below may reference images not displayed]

FINDINGS: The heart size and pulmonary vascularity are normal and the lungs
are clear. No effusions. Old healed right sixth rib fracture.
IMPRESSION: No active cardiopulmonary disease.

## 2017-10-08 DIAGNOSIS — R079 Chest pain, unspecified: Secondary | ICD-10-CM | POA: Diagnosis not present

## 2017-10-08 DIAGNOSIS — Z5321 Procedure and treatment not carried out due to patient leaving prior to being seen by health care provider: Secondary | ICD-10-CM | POA: Diagnosis not present

## 2017-10-09 NOTE — Telephone Encounter (Signed)
I would go ahead with MR arthrogram of that shoulder so we could compare it to the one she had in 2016 instead of a standard MRI.

## 2017-10-09 NOTE — Telephone Encounter (Signed)
Patient called back regarding pain in right shoulder. Patient states she was playing with her granddaughter recently and feels that she pulled/tore something in her shoulder due to worsening pain. She is interested in an MRI but states she had an arthrogram in the past that showed a tear and would like to know which imaging would be most beneficial.

## 2017-10-10 DIAGNOSIS — R5383 Other fatigue: Secondary | ICD-10-CM | POA: Diagnosis not present

## 2017-10-10 DIAGNOSIS — S46811A Strain of other muscles, fascia and tendons at shoulder and upper arm level, right arm, initial encounter: Secondary | ICD-10-CM | POA: Diagnosis not present

## 2017-10-10 DIAGNOSIS — X58XXXA Exposure to other specified factors, initial encounter: Secondary | ICD-10-CM | POA: Diagnosis not present

## 2017-10-10 DIAGNOSIS — R0602 Shortness of breath: Secondary | ICD-10-CM | POA: Diagnosis not present

## 2017-10-10 DIAGNOSIS — R51 Headache: Secondary | ICD-10-CM | POA: Diagnosis not present

## 2017-10-10 DIAGNOSIS — Y999 Unspecified external cause status: Secondary | ICD-10-CM | POA: Diagnosis not present

## 2017-10-10 DIAGNOSIS — R05 Cough: Secondary | ICD-10-CM | POA: Diagnosis not present

## 2017-10-11 ENCOUNTER — Encounter: Payer: Self-pay | Admitting: Family Medicine

## 2017-10-11 ENCOUNTER — Ambulatory Visit (INDEPENDENT_AMBULATORY_CARE_PROVIDER_SITE_OTHER): Payer: Medicare HMO | Admitting: Family Medicine

## 2017-10-11 VITALS — BP 141/69 | HR 78 | Ht 62.0 in | Wt 218.0 lb

## 2017-10-11 DIAGNOSIS — R635 Abnormal weight gain: Secondary | ICD-10-CM

## 2017-10-11 DIAGNOSIS — I471 Supraventricular tachycardia: Secondary | ICD-10-CM

## 2017-10-11 DIAGNOSIS — C519 Malignant neoplasm of vulva, unspecified: Secondary | ICD-10-CM

## 2017-10-11 DIAGNOSIS — I10 Essential (primary) hypertension: Secondary | ICD-10-CM | POA: Diagnosis not present

## 2017-10-11 DIAGNOSIS — C4499 Other specified malignant neoplasm of skin, unspecified: Secondary | ICD-10-CM | POA: Diagnosis not present

## 2017-10-11 DIAGNOSIS — Z6839 Body mass index (BMI) 39.0-39.9, adult: Secondary | ICD-10-CM | POA: Diagnosis not present

## 2017-10-11 DIAGNOSIS — R490 Dysphonia: Secondary | ICD-10-CM | POA: Diagnosis not present

## 2017-10-11 DIAGNOSIS — B372 Candidiasis of skin and nail: Secondary | ICD-10-CM | POA: Diagnosis not present

## 2017-10-11 MED ORDER — NYSTATIN 100000 UNIT/GM EX POWD: Freq: Four times a day (QID) | CUTANEOUS | 0 refills | Status: DC

## 2017-10-11 NOTE — Patient Instructions (Addendum)
[facility] at [phone]  [facility] location.
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
## 2017-10-11 NOTE — Addendum Note (Signed)
Addended by: Beatrice Lecher D on: 10/11/2017 12:22 PM   Modules accepted: Level of Service

## 2017-10-15 ENCOUNTER — Ambulatory Visit: Payer: Self-pay | Admitting: Family Medicine

## 2017-10-16 ENCOUNTER — Ambulatory Visit (INDEPENDENT_AMBULATORY_CARE_PROVIDER_SITE_OTHER): Payer: Medicare HMO | Admitting: Family Medicine

## 2017-10-16 ENCOUNTER — Encounter: Payer: Self-pay | Admitting: Family Medicine

## 2017-10-16 VITALS — BP 149/90 | HR 80 | Temp 99.1°F | Wt 219.0 lb

## 2017-10-16 DIAGNOSIS — J0101 Acute recurrent maxillary sinusitis: Secondary | ICD-10-CM

## 2017-10-16 DIAGNOSIS — I10 Essential (primary) hypertension: Secondary | ICD-10-CM | POA: Diagnosis not present

## 2017-10-16 DIAGNOSIS — I471 Supraventricular tachycardia: Secondary | ICD-10-CM | POA: Diagnosis not present

## 2017-10-16 MED ORDER — AZELASTINE HCL 0.1 % NA SOLN: 1.0000 | Freq: Two times a day (BID) | NASAL | 12 refills | Status: DC

## 2017-10-16 NOTE — Patient Instructions (Signed)
Thank you for coming in today. Try the astelin spray.  If not better take the antibiotic Dr Madilyn Fireman prescribed in January.  Recheck with Dr Madilyn Fireman as needed.  Heating pad and sinus rinse may help.    Sinusitis, Adult Sinusitis is soreness and inflammation of your sinuses. Sinuses are hollow spaces in the bones around your face. They are located:  Around your eyes.  In the middle of your forehead.  Behind your nose.  In your cheekbones.  Your sinuses and nasal passages are lined with a stringy fluid (mucus). Mucus normally drains out of your sinuses. When your nasal tissues get inflamed or swollen, the mucus can get trapped or blocked so air cannot flow through your sinuses. This lets bacteria, viruses, and funguses grow, and that leads to infection. Follow these instructions at home: Medicines  Take, use, or apply over-the-counter and prescription medicines only as told by your doctor. These may include nasal sprays.  If you were prescribed an antibiotic medicine, take it as told by your doctor. Do not stop taking the antibiotic even if you start to feel better. Hydrate and Humidify  Drink enough water to keep your pee (urine) clear or pale yellow.  Use a cool mist humidifier to keep the humidity level in your home above 50%.  Breathe in steam for 10-15 minutes, 3-4 times a day or as told by your doctor. You can do this in the bathroom while a hot shower is running.  Try not to spend time in cool or dry air. Rest  Rest as much as possible.  Sleep with your head raised (elevated).  Make sure to get enough sleep each night. General instructions  Put a warm, moist washcloth on your face 3-4 times a day or as told by your doctor. This will help with discomfort.  Wash your hands often with soap and water. If there is no soap and water, use hand sanitizer.  Do not smoke. Avoid being around people who are smoking (secondhand smoke).  Keep all follow-up visits as told by  your doctor. This is important. Contact a doctor if:  You have a fever.  Your symptoms get worse.  Your symptoms do not get better within 10 days. Get help right away if:  You have a very bad headache.  You cannot stop throwing up (vomiting).  You have pain or swelling around your face or eyes.  You have trouble seeing.  You feel confused.  Your neck is stiff.  You have trouble breathing. This information is not intended to replace advice given to you by your health care provider. Make sure you discuss any questions you have with your health care provider. Document Released: 01/17/2008 Document Revised: 03/26/2016 Document Reviewed: 05/26/2015 Elsevier Interactive Patient Education  [employer].
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
## 2017-10-17 DIAGNOSIS — B349 Viral infection, unspecified: Secondary | ICD-10-CM | POA: Diagnosis not present

## 2017-10-17 DIAGNOSIS — S0003XA Contusion of scalp, initial encounter: Secondary | ICD-10-CM | POA: Diagnosis not present

## 2017-10-17 DIAGNOSIS — R112 Nausea with vomiting, unspecified: Secondary | ICD-10-CM | POA: Diagnosis not present

## 2017-10-17 DIAGNOSIS — E860 Dehydration: Secondary | ICD-10-CM | POA: Diagnosis not present

## 2017-10-17 DIAGNOSIS — R51 Headache: Secondary | ICD-10-CM | POA: Diagnosis not present

## 2017-10-17 DIAGNOSIS — R509 Fever, unspecified: Secondary | ICD-10-CM | POA: Diagnosis not present

## 2017-10-17 DIAGNOSIS — X58XXXA Exposure to other specified factors, initial encounter: Secondary | ICD-10-CM | POA: Diagnosis not present

## 2017-10-17 DIAGNOSIS — R0989 Other specified symptoms and signs involving the circulatory and respiratory systems: Secondary | ICD-10-CM | POA: Diagnosis not present

## 2017-10-17 DIAGNOSIS — R197 Diarrhea, unspecified: Secondary | ICD-10-CM | POA: Diagnosis not present

## 2017-10-17 DIAGNOSIS — N390 Urinary tract infection, site not specified: Secondary | ICD-10-CM | POA: Diagnosis not present

## 2017-10-17 DIAGNOSIS — Y999 Unspecified external cause status: Secondary | ICD-10-CM | POA: Diagnosis not present

## 2017-10-18 ENCOUNTER — Encounter (HOSPITAL_BASED_OUTPATIENT_CLINIC_OR_DEPARTMENT_OTHER): Payer: Self-pay | Admitting: *Deleted

## 2017-10-18 ENCOUNTER — Other Ambulatory Visit: Payer: Self-pay

## 2017-10-18 ENCOUNTER — Emergency Department (HOSPITAL_BASED_OUTPATIENT_CLINIC_OR_DEPARTMENT_OTHER)
Admission: EM | Admit: 2017-10-18 | Discharge: 2017-10-18 | Disposition: A | Discharge status: Home / Self Care | Payer: Medicare HMO | Attending: Emergency Medicine | Admitting: Emergency Medicine

## 2017-10-18 DIAGNOSIS — Z87891 Personal history of nicotine dependence: Secondary | ICD-10-CM | POA: Diagnosis not present

## 2017-10-18 DIAGNOSIS — R200 Anesthesia of skin: Secondary | ICD-10-CM | POA: Diagnosis not present

## 2017-10-18 DIAGNOSIS — I10 Essential (primary) hypertension: Secondary | ICD-10-CM | POA: Insufficient documentation

## 2017-10-18 DIAGNOSIS — R0981 Nasal congestion: Secondary | ICD-10-CM | POA: Diagnosis not present

## 2017-10-18 DIAGNOSIS — Z79899 Other long term (current) drug therapy: Secondary | ICD-10-CM | POA: Diagnosis not present

## 2017-10-18 DIAGNOSIS — R1084 Generalized abdominal pain: Secondary | ICD-10-CM | POA: Diagnosis not present

## 2017-10-18 DIAGNOSIS — J45909 Unspecified asthma, uncomplicated: Secondary | ICD-10-CM | POA: Diagnosis not present

## 2017-10-18 DIAGNOSIS — R197 Diarrhea, unspecified: Secondary | ICD-10-CM | POA: Diagnosis not present

## 2017-10-18 MED ORDER — SODIUM CHLORIDE 0.9 % IV BOLUS (SEPSIS)
1000.0000 mL | Freq: Once | INTRAVENOUS | Status: AC
  Administered 2017-10-18: 1000 mL via INTRAVENOUS

## 2017-10-18 MED ORDER — SODIUM CHLORIDE 0.9 % IV SOLN
INTRAVENOUS | Status: DC
Start: ? — End: 2017-10-18

## 2017-10-18 MED ORDER — DICYCLOMINE HCL 10 MG/ML IM SOLN
20.0000 mg | Freq: Once | INTRAMUSCULAR | Status: AC
  Administered 2017-10-18: 20 mg via INTRAMUSCULAR
  Filled 2017-10-18: qty 2

## 2017-10-18 NOTE — ED Triage Notes (Signed)
She was seen yesterday at [facility] ED for abdominal pain and diarrhea. She was diagnosed with a UTI. Nausea. She is here today for same. She was started on an antibiotic yesterday.
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
## 2017-10-22 ENCOUNTER — Encounter: Payer: Self-pay | Admitting: Family Medicine

## 2017-10-22 ENCOUNTER — Ambulatory Visit: Payer: Medicare HMO | Admitting: Family Medicine

## 2017-10-22 VITALS — BP 164/100 | HR 96 | Temp 98.7°F | Resp 28

## 2017-10-22 DIAGNOSIS — I10 Essential (primary) hypertension: Secondary | ICD-10-CM

## 2017-10-22 DIAGNOSIS — J4520 Mild intermittent asthma, uncomplicated: Secondary | ICD-10-CM

## 2017-10-22 DIAGNOSIS — J3089 Other allergic rhinitis: Secondary | ICD-10-CM | POA: Diagnosis not present

## 2017-10-22 DIAGNOSIS — G35 Multiple sclerosis: Secondary | ICD-10-CM

## 2017-10-22 DIAGNOSIS — Z79899 Other long term (current) drug therapy: Secondary | ICD-10-CM

## 2017-10-22 DIAGNOSIS — I472 Ventricular tachycardia, unspecified: Secondary | ICD-10-CM

## 2017-10-22 DIAGNOSIS — J383 Other diseases of vocal cords: Secondary | ICD-10-CM | POA: Diagnosis not present

## 2017-10-22 DIAGNOSIS — G4733 Obstructive sleep apnea (adult) (pediatric): Secondary | ICD-10-CM | POA: Diagnosis not present

## 2017-10-22 DIAGNOSIS — K219 Gastro-esophageal reflux disease without esophagitis: Secondary | ICD-10-CM | POA: Diagnosis not present

## 2017-10-22 DIAGNOSIS — Z9989 Dependence on other enabling machines and devices: Secondary | ICD-10-CM | POA: Diagnosis not present

## 2017-10-22 MED ORDER — RANITIDINE HCL 150 MG PO TABS: 150.0000 mg | ORAL_TABLET | Freq: Two times a day (BID) | ORAL | 5 refills | Status: DC

## 2017-10-22 NOTE — Patient Instructions (Addendum)
Xopenex 2 puffs every 6 hours if needed for cough or wheeze Begin ranitidine 150 mg twice a day to control reflux Begin Mucinex 1200 mg twice a day to thin post nasal drip Increase fluids as tolerated to thin secretions Call us if she is not doing well on this treatment plan Continue on her other medications  If you have an allergic reaction,  take Benadryl 50 mg every 4 hours and if you have life-threatening symptoms inject  with EpiPen 0.3 mg. Then write what you had to eat or drink in the previous 4 hours  Follow up in 6 months or sooner if needed

## 2017-10-22 NOTE — Progress Notes (Addendum)
[place] [id number] Dept: [phone]  FOLLOW UP NOTE  Patient ID: Jennifer [name], female    DOB: 16-Jan-1966  Age: 52 y.o. MRN: [id number] Date of Office Visit: 10/22/2017  Assessment  Chief Complaint: Cough (chest tightness); Wheezing; and Nasal Congestion (bloody discharge)  HPI Jennifer [name] is a 52 year old female who presents to the clinic for a sick visit. She was last seen in this clinic on 02/20/2017 by [name] for evaluation of mild intermittent asthma, multiple scleroses, gastroesophageal reflux disease, essential hypertension with use of a beta blocker, and obstructive sleep apnea treated with continuous positive airway pressure. At that visit, she was noted to have a cough for 2 weeks priot to the appointment. The cough was aggravated by heat.     At today's visit, [name] reports not feeling well in general for about the last 2 weeks. She reports that one week ago she began to have nasal congestion, body aches, low grade temperature, sore throat, voice changes, and cough with clear sputum.She went to the Emergency Department on 10/17/2017 where she received clindamycin for a urinary tract infection. She reports she took this medicine for a few days when she began to feel bloated and her reflux increased at which point she stopped taking the clindamycin.  She reports that last night she began coughing more which lead to post-tussive vomiting and she had trouble swallowing a green bean and reports "it went down the wrong way" and felt as though it was stuck in her throat. Today she heard a whistling sound in her throat and felt a brief chest tightness for which she took a Xopenex treatment via nebulizer which she reported as providing no relief. She is reporting her symptoms in the office today as cough with clear sputum, wheeze, thick post nasal drainage, nasal drainage with occasional blood tinge,  voice changes, sore throat, and increased throat clearing.  She has been using Astelin nasal spray, mometasone nasal spray, and nasal rinses daily. She reports a decrease in fluid intake over the last week. She reports heartburn every day for about 2 weeks. She denies any caffeine intake.   Her current medications are listed in the chart.   Drug Allergies:  Allergies  Allergen Reactions  . Ciprofloxacin Swelling    REACTION: tongue swells  . Milk-Related Compounds Swelling    Throat feels tight  . Sulfa Antibiotics Rash, Other (See Comments) and Shortness Of Breath    Other reaction(s): SHORTNESS OF BREATH Other reaction(s): SHORTNESS OF BREATH   . Telmisartan Swelling    Tongue swelling, Micardis  . Ace Inhibitors Cough  . Aspirin Hives and Other (See Comments)    flushing flushing  . Avelox [Moxifloxacin Hcl In Nacl] Itching       . Azithromycin     Lip swelling, SOB.     . Beta Adrenergic Blockers Other (See Comments)    Feels like chest tightening labetalol, bystolic  Feels like chest tightening "Metoprolol"   . Buspar [Buspirone] Other (See Comments)    Light headed  . Butorphanol Tartrate Other (See Comments)    Patient aggitated  . Cetirizine Hives and Rash       . Clonidine Hcl     REACTION: makes blood pressure high  . Codeine Other (See Comments)    Feels sob  . Cortisone     Feels like she is going crazy  . Erythromycin Rash  . Fentanyl Other (See Comments)    aggressive   .  Fluoxetine Hcl Other (See Comments)    REACTION: headaches  . Ketorolac Tromethamine     jittery  . Lidocaine Other (See Comments)    When it involves the throat,   . Lisinopril Cough    Other reaction(s): Cough REACTION: cough REACTION: cough  . Metoclopramide Hcl Other (See Comments)    Dystonic reaction  . Montelukast Other (See Comments)    Singulair  . Mushroom Extract Complex Other (See Comments)    Didn't feel right  . Naproxen Other (See Comments)    FLUSHING  . Paroxetine Other (See Comments)    REACTION: headaches  .  Penicillins Rash  . Pravastatin Other (See Comments)    Myalgias Myalgias  . Promethazine Other (See Comments)    Dystonic reaction  . Promethazine Hcl Other (See Comments)    jittery  . Quinolones Swelling and Rash  . Serotonin Reuptake Inhibitors (Ssris) Other (See Comments)    Headache Effexor, prozac, zoloft,   . Sertraline Hcl     REACTION: headaches  . Stelazine [Trifluoperazine] Other (See Comments)    Dystonic reaction  . Tobramycin Itching    itching , rash  . Trifluoperazine Hcl     dystonic  . Versed [Midazolam]     agitation  . Whey     Milk allergy  . Adhesive [Tape] Rash    EKG monitor patches, some tapes"reddnes,blisters"  . Butorphanol Anxiety    Patient agitated  . Ceftriaxone Rash    rocephin  . Erythromycin Base Itching and Rash  . Iron Rash    Flushing with certain IV types  . Metoclopramide Itching and Other (See Comments)    Dystonic reaction  . Metronidazole Rash  . Prednisone Anxiety and Palpitations  . Prochlorperazine Anxiety    Compazine:  Dystonic reaction  . Venlafaxine Anxiety  . Zyrtec [Cetirizine Hcl] Rash    All over body    Physical Exam: BP (!) 164/100 (BP Location: Right Arm, Patient Position: Sitting, Cuff Size: Large)   Pulse 96   Temp 98.7 F (37.1 C) (Oral)   Resp (!) 28   LMP 06/25/2013   SpO2 97%    Physical Exam  Constitutional: She is oriented to person, place, and time. She appears well-developed.  HENT:  Head: Normocephalic and atraumatic.  Right Ear: External ear normal.  Left Ear: External ear normal.  Bilateral nares erythematous and edematous with no drainage noted. Pharynx slightly erythematous with no exudate noted.  Eyes: Conjunctivae are normal.  Neck: Normal range of motion. Neck supple.  Cardiovascular: Normal rate, regular rhythm and normal heart sounds.  S1S2 normal. Normal rate and rhythm. No murmur noted.   Pulmonary/Chest: Effort normal and breath sounds normal.  Lungs clear to auscultation    Musculoskeletal: Normal range of motion.  Neurological: She is alert and oriented to person, place, and time.  Skin: Skin is warm and dry.  Psychiatric: She has a normal mood and affect. Her behavior is normal. Judgment and thought content normal.    Diagnostics: FVC 2.79, FEV1 2.51.  Predicted FVC 3.  2 4, predicted FEV1 2.55.  Spirometry is within the normal range.  Assessment and Plan: 1. Mild intermittent asthma without complication   2. Current use of beta blocker   3. Vocal cord dysfunction   4. Gastroesophageal reflux disease without esophagitis   5. Other allergic rhinitis   6. Multiple sclerosis ([place])   7. Obstructive sleep apnea treated with continuous positive airway pressure (CPAP)   8. Ventricular tachycardia (  [place])   9. Essential hypertension     Meds ordered this encounter  Medications  . ranitidine (ZANTAC) 150 MG tablet    Sig: Take 1 tablet (150 mg total) by mouth 2 (two) times daily.    Dispense:  60 tablet    Refill:  5    Patient Instructions  Xopenex 2 puffs every 6 hours if needed for cough or wheeze Begin ranitidine 150 mg twice a day to control reflux Begin Mucinex 1200 mg twice a day to thin post nasal drip Increase fluids as tolerated to thin secretions Call us if she is not doing well on this treatment plan Continue on her other medications  If you have an allergic reaction,  take Benadryl 50 mg every 4 hours and if you have life-threatening symptoms inject  with EpiPen 0.3 mg. Then write what you had to eat or drink in the previous 4 hours  Follow up in 6 months or sooner if needed   Return in about 6 months (around 04/24/2018), or if symptoms worsen or fail to improve.   Zoeya Gramajo was seen in the clinic with [name] today. Thank you for the opportunity to care for this patient.  Please do not hesitate to contact me with questions.  Gareth Morgan, FNP [facility]  I have  provided oversight concerning Gareth Morgan' evaluation and treatment of this patient's health issues addressed during today's encounter. I agree with the assessment and therapeutic plan as outlined in the note.   Thank you for the opportunity to care for this patient.  Please do not hesitate to contact me with questions.  Penne Lash, M.D.  [facility] [address] [place], [place] [address] [phone]

## 2017-10-24 ENCOUNTER — Ambulatory Visit (INDEPENDENT_AMBULATORY_CARE_PROVIDER_SITE_OTHER): Payer: Medicare HMO | Admitting: Family Medicine

## 2017-10-24 ENCOUNTER — Encounter: Payer: Self-pay | Admitting: Family Medicine

## 2017-10-24 VITALS — BP 139/75 | HR 78 | Ht 62.0 in | Wt 218.0 lb

## 2017-10-24 DIAGNOSIS — B9789 Other viral agents as the cause of diseases classified elsewhere: Secondary | ICD-10-CM | POA: Diagnosis not present

## 2017-10-24 DIAGNOSIS — G35 Multiple sclerosis: Secondary | ICD-10-CM | POA: Diagnosis not present

## 2017-10-24 DIAGNOSIS — R6889 Other general symptoms and signs: Secondary | ICD-10-CM | POA: Diagnosis not present

## 2017-10-24 DIAGNOSIS — R0989 Other specified symptoms and signs involving the circulatory and respiratory systems: Secondary | ICD-10-CM

## 2017-10-24 DIAGNOSIS — R0602 Shortness of breath: Secondary | ICD-10-CM | POA: Diagnosis not present

## 2017-10-24 DIAGNOSIS — R140 Abdominal distension (gaseous): Secondary | ICD-10-CM | POA: Diagnosis not present

## 2017-10-24 DIAGNOSIS — R399 Unspecified symptoms and signs involving the genitourinary system: Secondary | ICD-10-CM | POA: Diagnosis not present

## 2017-10-24 DIAGNOSIS — R300 Dysuria: Secondary | ICD-10-CM

## 2017-10-24 DIAGNOSIS — I10 Essential (primary) hypertension: Secondary | ICD-10-CM | POA: Diagnosis not present

## 2017-10-24 DIAGNOSIS — M549 Dorsalgia, unspecified: Secondary | ICD-10-CM | POA: Diagnosis not present

## 2017-10-24 DIAGNOSIS — R208 Other disturbances of skin sensation: Secondary | ICD-10-CM | POA: Diagnosis not present

## 2017-10-24 DIAGNOSIS — R7303 Prediabetes: Secondary | ICD-10-CM | POA: Diagnosis not present

## 2017-10-24 DIAGNOSIS — G8929 Other chronic pain: Secondary | ICD-10-CM | POA: Diagnosis not present

## 2017-10-24 DIAGNOSIS — R0981 Nasal congestion: Secondary | ICD-10-CM | POA: Diagnosis not present

## 2017-10-24 DIAGNOSIS — Z8589 Personal history of malignant neoplasm of other organs and systems: Secondary | ICD-10-CM | POA: Diagnosis not present

## 2017-10-24 DIAGNOSIS — R05 Cough: Secondary | ICD-10-CM | POA: Diagnosis not present

## 2017-10-24 DIAGNOSIS — J029 Acute pharyngitis, unspecified: Secondary | ICD-10-CM | POA: Diagnosis not present

## 2017-10-24 DIAGNOSIS — E785 Hyperlipidemia, unspecified: Secondary | ICD-10-CM | POA: Diagnosis not present

## 2017-10-24 LAB — POCT URINALYSIS DIPSTICK
Bilirubin, UA: NEGATIVE
Blood, UA: NEGATIVE
Glucose, UA: NEGATIVE
Ketones, UA: NEGATIVE
Leukocytes, UA: NEGATIVE
Nitrite, UA: NEGATIVE
Protein, UA: NEGATIVE
Spec Grav, UA: 1.025 (ref 1.010–1.025)
Urobilinogen, UA: 0.2 E.U./dL
pH, UA: 5.5 (ref 5.0–8.0)
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
## 2017-10-25 ENCOUNTER — Ambulatory Visit: Payer: Self-pay | Admitting: Family Medicine

## 2017-10-25 LAB — URINE CULTURE
MICRO NUMBER:: 90320238
Result:: NO GROWTH
SPECIMEN QUALITY:: ADEQUATE

## 2017-10-29 ENCOUNTER — Telehealth: Payer: Self-pay | Admitting: Family Medicine

## 2017-10-29 NOTE — Telephone Encounter (Signed)
Patient left a voicemail asking for order for arthrogram (?) of shoulder to be faxed to [phone].  If you have any questions you can call her at [phone].

## 2017-10-29 NOTE — Telephone Encounter (Signed)
She called back again, says she has been waiting a couple weeks on this.  Please call patient back with status of this today.

## 2017-10-30 NOTE — Addendum Note (Signed)
Addended by: Sherrie George F on: 10/30/2017 07:58 AM   Modules accepted: Orders

## 2017-10-31 DIAGNOSIS — E876 Hypokalemia: Secondary | ICD-10-CM | POA: Diagnosis not present

## 2017-11-02 NOTE — Addendum Note (Signed)
Addended by: Sherrie George F on: 11/02/2017 11:41 AM   Modules accepted: Orders

## 2017-11-02 NOTE — Addendum Note (Signed)
Addended by: Sherrie George F on: 11/02/2017 10:16 AM   Modules accepted: Orders

## 2017-11-05 ENCOUNTER — Telehealth: Payer: Self-pay | Admitting: *Deleted

## 2017-11-05 NOTE — Telephone Encounter (Signed)
Called and spoke to a nurse at [facility]. Patient was seen there on 1/18, patient needs to call and schedule a follow up appt. Per telephone note on 2/2 by Dr. Dema Severin they have patient's records.

## 2017-11-06 DIAGNOSIS — H52223 Regular astigmatism, bilateral: Secondary | ICD-10-CM | POA: Diagnosis not present

## 2017-11-06 DIAGNOSIS — H5203 Hypermetropia, bilateral: Secondary | ICD-10-CM | POA: Diagnosis not present

## 2017-11-06 DIAGNOSIS — H40019 Open angle with borderline findings, low risk, unspecified eye: Secondary | ICD-10-CM | POA: Diagnosis not present

## 2017-11-06 DIAGNOSIS — H52209 Unspecified astigmatism, unspecified eye: Secondary | ICD-10-CM | POA: Diagnosis not present

## 2017-11-06 DIAGNOSIS — H40013 Open angle with borderline findings, low risk, bilateral: Secondary | ICD-10-CM | POA: Diagnosis not present

## 2017-11-06 DIAGNOSIS — H524 Presbyopia: Secondary | ICD-10-CM | POA: Diagnosis not present

## 2017-11-06 DIAGNOSIS — I10 Essential (primary) hypertension: Secondary | ICD-10-CM | POA: Diagnosis not present

## 2017-11-08 ENCOUNTER — Encounter: Payer: Self-pay | Admitting: Family Medicine

## 2017-11-08 ENCOUNTER — Ambulatory Visit (INDEPENDENT_AMBULATORY_CARE_PROVIDER_SITE_OTHER): Payer: Medicare HMO | Admitting: Family Medicine

## 2017-11-08 VITALS — BP 137/71 | HR 81 | Ht 62.0 in | Wt 217.0 lb

## 2017-11-08 DIAGNOSIS — F5101 Primary insomnia: Secondary | ICD-10-CM | POA: Diagnosis not present

## 2017-11-08 DIAGNOSIS — G43009 Migraine without aura, not intractable, without status migrainosus: Secondary | ICD-10-CM

## 2017-11-08 DIAGNOSIS — B372 Candidiasis of skin and nail: Secondary | ICD-10-CM | POA: Diagnosis not present

## 2017-11-08 DIAGNOSIS — R002 Palpitations: Secondary | ICD-10-CM

## 2017-11-08 DIAGNOSIS — I10 Essential (primary) hypertension: Secondary | ICD-10-CM | POA: Diagnosis not present

## 2017-11-08 IMAGING — CR DG CHEST 2V
2 series · 2 of 2 positions shown · non-contrast
Comparison: Chest radiograph 3835

CLINICAL DATA: Chest pain x today with some lightheaded and
dizziness. HX: Uterine CA, MS, SVT, HTN, Asthma, Former smoker,
Hysterectomy

EXAM:
CHEST  2 VIEW

[w chest pa]
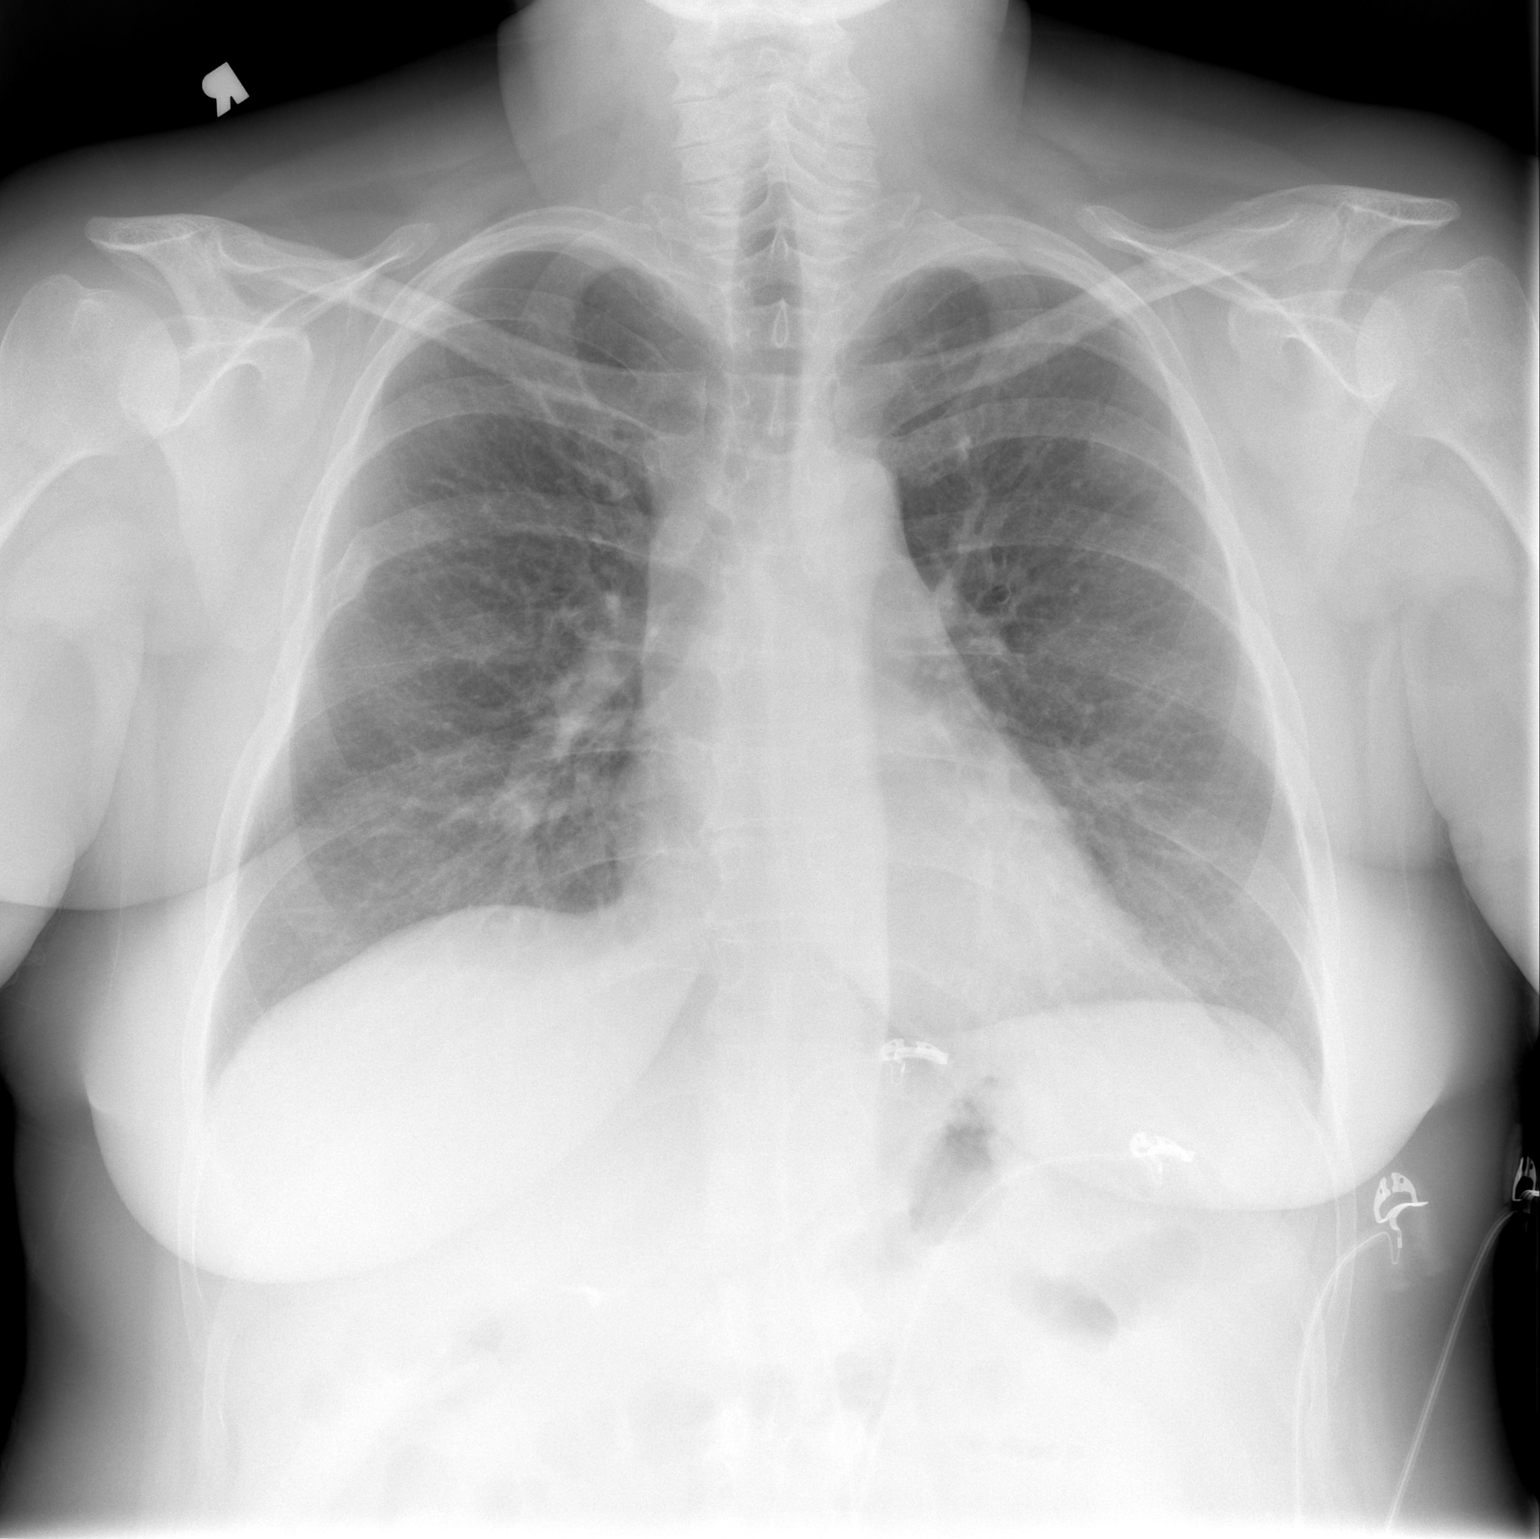

[w chest lat]
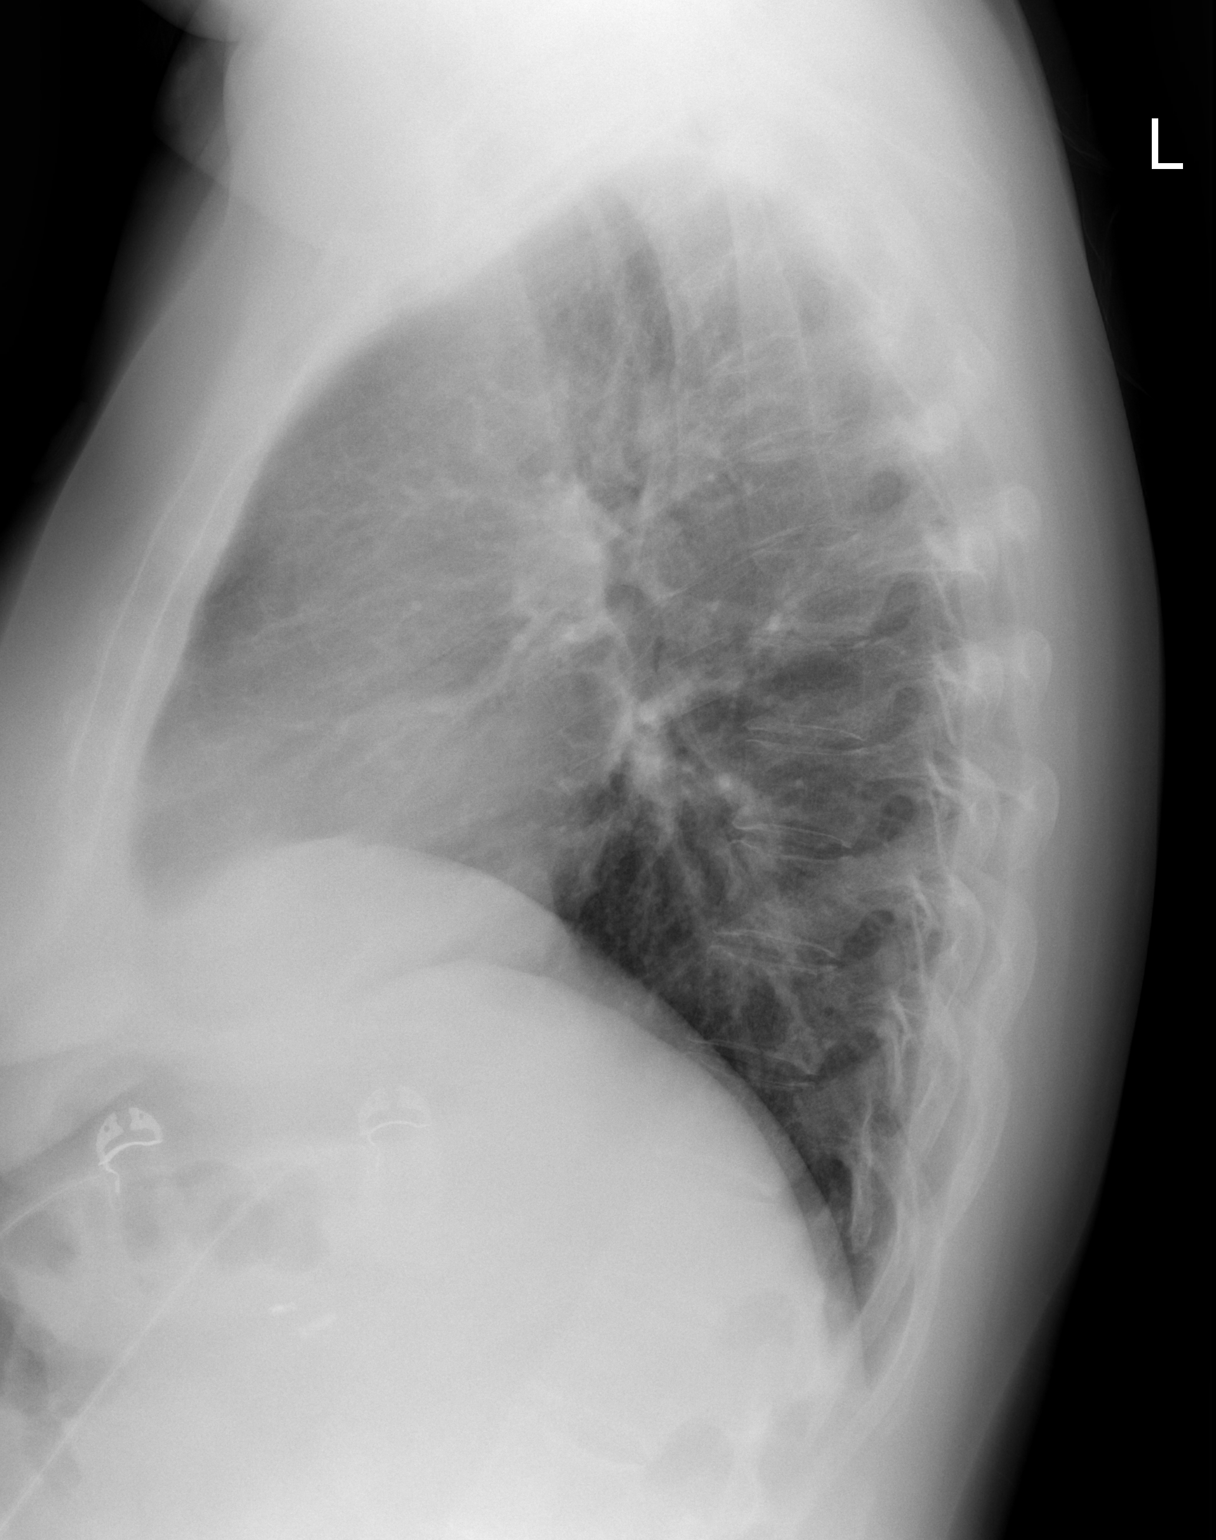

[2 of 2 positions shown; findings below may reference images not displayed]

FINDINGS: Normal mediastinum and cardiac silhouette. Chronic central
bronchitic markings. Normal pulmonary vasculature. No effusion,
infiltrate, or pneumothorax. Remote RIGHT rib fractures.
IMPRESSION: Chronic bronchitic markings.  No acute findings

## 2017-11-08 NOTE — Patient Instructions (Addendum)
[place] on [employer] for sleep.   We will work on trying to find someone who specialized in PTSD

## 2017-11-08 NOTE — Progress Notes (Signed)
Subjective:    CC: 2 week follow-up.    HPI:  She is feeling extremely tired again. She is feeling really down again as well.  She notices every few weeks she feels like she goes through these episodes where she gets very physically tired but also gets very down and depressed with it.  It makes it hard to get motivated.  She is also been experiencing some very poor sleep quality over the last 2 weeks.  She says she is not sleeping during the day but just cannot fall asleep during the night.  She will eventually drift off and then just weight back up very alert.  It happens multiple times.  She does not feel like she really gets into a good deep restful sleep until about 5 AM and then has to get up a few hours later.  She denies any change or increase in caffeine intake particularly in the afternoons or evenings.  She has been under some stress but nothing new and nothing out of the usual.  She is having more frequent PACs and PVCs.  she is on a betblocker.    Also wanted me to like to take a look over the rash over her lower abdomen underneath the abdominal crease.  She is been using the powder.  She says it still feels a little irritated.  She still has not started the Topamax yet.  She is been very fearful to start it but she did fill the prescription.  She wanted to know what the half life of the drug is.  She is worried about having a reaction.  Hypertension- Pt denies chest pain, SOB, dizziness, or heart palpitations.  Taking meds as directed w/o problems.  Denies medication side effects.     Past medical history, Surgical history, Family history not pertinant except as noted below, Social history, Allergies, and medications have been entered into the medical record, reviewed, and corrections made.   Review of Systems: No fevers, chills, night sweats, weight loss, chest pain, or shortness of breath.   Objective:    General: Well Developed, well nourished, and in no acute distress.   Neuro: Alert and oriented x3, extra-ocular muscles intact, sensation grossly intact.  HEENT: Normocephalic, atraumatic  Skin: Warm and dry, no rashes. Cardiac: Regular rate and rhythm, no murmurs rubs or gallops, no lower extremity edema.  Respiratory: Clear to auscultation bilaterally. Not using accessory muscles, speaking in full sentences.   Impression and Recommendations:   Insomnia-unclear what may be triggering this for her for the last 2 weeks but is clearly not chronic.  We discussed strategies such as avoiding screen time for an hour before bedtime.  Also encouraged her to use sleep meditation and gave her a mentation for [place] on YouTube.  We also discussed making sure that the bedroom is cool dark and quiet.  And avoiding excess caffeine.  Migraine headaches-we discussed that she could certainly start with half a tab of the Topamax once a day and see if she tolerates that well before moving up to a whole tab.  The half-life is 21 hours.  Palpitations-likely just her PVCs and PACs.  I think she also has some intermittent SVT.  She will need to follow-up with cardiology if the frequency persists.  Otherwise continue taking her beta-blocker daily.  Hypertension-blood pressure well controlled today.  Rash-looks significantly better with the nystatin powder.

## 2017-11-09 ENCOUNTER — Ambulatory Visit: Payer: Self-pay | Admitting: Gynecology

## 2017-11-09 ENCOUNTER — Other Ambulatory Visit: Payer: Self-pay | Admitting: Family Medicine

## 2017-11-12 NOTE — Addendum Note (Signed)
Addended by: Sherrie George F on: 11/12/2017 09:22 AM   Modules accepted: Orders

## 2017-11-13 DIAGNOSIS — R112 Nausea with vomiting, unspecified: Secondary | ICD-10-CM | POA: Diagnosis not present

## 2017-11-13 DIAGNOSIS — R1011 Right upper quadrant pain: Secondary | ICD-10-CM | POA: Diagnosis not present

## 2017-11-13 DIAGNOSIS — Z87891 Personal history of nicotine dependence: Secondary | ICD-10-CM | POA: Diagnosis not present

## 2017-11-13 DIAGNOSIS — R69 Illness, unspecified: Secondary | ICD-10-CM | POA: Diagnosis not present

## 2017-11-13 DIAGNOSIS — R5383 Other fatigue: Secondary | ICD-10-CM | POA: Diagnosis not present

## 2017-11-13 DIAGNOSIS — J029 Acute pharyngitis, unspecified: Secondary | ICD-10-CM | POA: Diagnosis not present

## 2017-11-14 DIAGNOSIS — Z0100 Encounter for examination of eyes and vision without abnormal findings: Secondary | ICD-10-CM | POA: Diagnosis not present

## 2017-11-14 DIAGNOSIS — H52209 Unspecified astigmatism, unspecified eye: Secondary | ICD-10-CM | POA: Diagnosis not present

## 2017-11-14 DIAGNOSIS — H524 Presbyopia: Secondary | ICD-10-CM | POA: Diagnosis not present

## 2017-11-15 DIAGNOSIS — B9789 Other viral agents as the cause of diseases classified elsewhere: Secondary | ICD-10-CM | POA: Diagnosis not present

## 2017-11-15 DIAGNOSIS — J069 Acute upper respiratory infection, unspecified: Secondary | ICD-10-CM | POA: Diagnosis not present

## 2017-11-15 DIAGNOSIS — R05 Cough: Secondary | ICD-10-CM | POA: Diagnosis not present

## 2017-11-15 DIAGNOSIS — J029 Acute pharyngitis, unspecified: Secondary | ICD-10-CM | POA: Diagnosis not present

## 2017-11-15 DIAGNOSIS — Z87891 Personal history of nicotine dependence: Secondary | ICD-10-CM | POA: Diagnosis not present

## 2017-11-16 ENCOUNTER — Emergency Department (HOSPITAL_BASED_OUTPATIENT_CLINIC_OR_DEPARTMENT_OTHER)
Admission: EM | Admit: 2017-11-16 | Discharge: 2017-11-17 | Disposition: A | Discharge status: Home / Self Care | Payer: Medicare HMO | Attending: Emergency Medicine | Admitting: Emergency Medicine

## 2017-11-16 ENCOUNTER — Other Ambulatory Visit: Payer: Self-pay

## 2017-11-16 ENCOUNTER — Encounter (HOSPITAL_BASED_OUTPATIENT_CLINIC_OR_DEPARTMENT_OTHER): Payer: Self-pay | Admitting: *Deleted

## 2017-11-16 DIAGNOSIS — I10 Essential (primary) hypertension: Secondary | ICD-10-CM | POA: Insufficient documentation

## 2017-11-16 DIAGNOSIS — Z87891 Personal history of nicotine dependence: Secondary | ICD-10-CM | POA: Diagnosis not present

## 2017-11-16 DIAGNOSIS — Z8542 Personal history of malignant neoplasm of other parts of uterus: Secondary | ICD-10-CM | POA: Insufficient documentation

## 2017-11-16 DIAGNOSIS — J45909 Unspecified asthma, uncomplicated: Secondary | ICD-10-CM | POA: Diagnosis not present

## 2017-11-16 DIAGNOSIS — J069 Acute upper respiratory infection, unspecified: Secondary | ICD-10-CM | POA: Diagnosis not present

## 2017-11-16 DIAGNOSIS — B9789 Other viral agents as the cause of diseases classified elsewhere: Secondary | ICD-10-CM | POA: Diagnosis not present

## 2017-11-16 DIAGNOSIS — J029 Acute pharyngitis, unspecified: Secondary | ICD-10-CM | POA: Diagnosis not present

## 2017-11-16 DIAGNOSIS — Z79899 Other long term (current) drug therapy: Secondary | ICD-10-CM | POA: Diagnosis not present

## 2017-11-16 DIAGNOSIS — R05 Cough: Secondary | ICD-10-CM | POA: Diagnosis not present

## 2017-11-16 LAB — CBG MONITORING, ED: Glucose-Capillary: 121 mg/dL — ABNORMAL HIGH (ref 65–99)

## 2017-11-16 MED ORDER — BENZONATATE 100 MG PO CAPS: 100.0000 mg | ORAL_CAPSULE | Freq: Three times a day (TID) | ORAL | 0 refills | Status: DC

## 2017-11-16 MED ORDER — BENZONATATE 100 MG PO CAPS
200.0000 mg | ORAL_CAPSULE | Freq: Once | ORAL | Status: DC
  Filled 2017-11-16: qty 2

## 2017-11-16 MED ORDER — SUCRALFATE 1 G PO TABS
1.0000 g | ORAL_TABLET | Freq: Once | ORAL | Status: AC
  Administered 2017-11-16: 1 g via ORAL
  Filled 2017-11-16: qty 1

## 2017-11-16 NOTE — ED Triage Notes (Signed)
Pt reports about 1 week of cough (productive cough-green and brown) and sore throat. Has had some low grade fevers).

## 2017-11-17 ENCOUNTER — Encounter (HOSPITAL_BASED_OUTPATIENT_CLINIC_OR_DEPARTMENT_OTHER): Payer: Self-pay | Admitting: Emergency Medicine
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
## 2017-11-18 DIAGNOSIS — R05 Cough: Secondary | ICD-10-CM | POA: Diagnosis not present

## 2017-11-18 DIAGNOSIS — J069 Acute upper respiratory infection, unspecified: Secondary | ICD-10-CM | POA: Diagnosis not present

## 2017-11-18 DIAGNOSIS — J3489 Other specified disorders of nose and nasal sinuses: Secondary | ICD-10-CM | POA: Diagnosis not present

## 2017-11-18 DIAGNOSIS — R5381 Other malaise: Secondary | ICD-10-CM | POA: Diagnosis not present

## 2017-11-18 DIAGNOSIS — B9789 Other viral agents as the cause of diseases classified elsewhere: Secondary | ICD-10-CM | POA: Diagnosis not present

## 2017-11-19 ENCOUNTER — Encounter: Payer: Self-pay | Admitting: Family Medicine

## 2017-11-19 ENCOUNTER — Ambulatory Visit: Payer: Medicare HMO | Admitting: Family Medicine

## 2017-11-19 VITALS — BP 140/100 | HR 96 | Temp 98.3°F | Resp 20

## 2017-11-19 DIAGNOSIS — I10 Essential (primary) hypertension: Secondary | ICD-10-CM

## 2017-11-19 DIAGNOSIS — I472 Ventricular tachycardia, unspecified: Secondary | ICD-10-CM

## 2017-11-19 DIAGNOSIS — J383 Other diseases of vocal cords: Secondary | ICD-10-CM | POA: Diagnosis not present

## 2017-11-19 DIAGNOSIS — J3089 Other allergic rhinitis: Secondary | ICD-10-CM | POA: Diagnosis not present

## 2017-11-19 DIAGNOSIS — K219 Gastro-esophageal reflux disease without esophagitis: Secondary | ICD-10-CM | POA: Diagnosis not present

## 2017-11-19 DIAGNOSIS — R69 Illness, unspecified: Secondary | ICD-10-CM | POA: Diagnosis not present

## 2017-11-19 DIAGNOSIS — J4530 Mild persistent asthma, uncomplicated: Secondary | ICD-10-CM | POA: Diagnosis not present

## 2017-11-19 DIAGNOSIS — Z79899 Other long term (current) drug therapy: Secondary | ICD-10-CM | POA: Diagnosis not present

## 2017-11-19 DIAGNOSIS — J4520 Mild intermittent asthma, uncomplicated: Secondary | ICD-10-CM | POA: Insufficient documentation

## 2017-11-19 MED ORDER — LEVALBUTEROL HCL 1.25 MG/3ML IN NEBU: INHALATION_SOLUTION | RESPIRATORY_TRACT | 1 refills | Status: DC

## 2017-11-19 NOTE — Patient Instructions (Addendum)
Xopenex 2 puffs every 6 hours if needed for cough or wheeze Continue ranitidine 150 mg twice a day to control reflux Continue Mucinex 1200 mg twice a day to thin post nasal drip Increase fluids as tolerated to thin secretions Begin Nasonex nasal spray two sprays in each nostril twice a day for a stuffy nose Finish the decadron provided by the ED last night Call us if she is not doing well on this treatment plan Continue on her other medications Follow up for your blood pressure with your primary care provider. If you have an allergic reaction,  take Benadryl 50 mg every 4 hours and if you have life-threatening symptoms inject  with EpiPen 0.3 mg. Then write what you had to eat or drink in the previous 4 hours  Follow up in 6 months or sooner if needed

## 2017-11-19 NOTE — Progress Notes (Signed)
[facility] [id number] Dept: [phone]  FOLLOW UP NOTE  Patient ID: Jennifer [name], female    DOB: 04-23-1966  Age: 52 y.o. MRN: [id number] Date of Office Visit: 11/19/2017  Assessment  Chief Complaint: Allergic Rhinitis  and Asthma  HPI Jennifer [name] is 52 year old female who presents for a sick visit. She was last seen in this clinic on 10/22/2017 by Gareth Morgan NP for evaluation of allergic rhinitis, GERD and asthma. At that visit, she continued Xopenex and began ranitidine, and mucinex.   She reports going to the emergency department last night for shortness of breath, eyes burning, and a low number on her peak flow meter. She received a prescription for dexamethasone 1 mg two times a day for 5 days and was released.   At today's visit, she reports shortness of breath that is worse when she is outside with a cough that is sometimes productive with a yellow sputum, however, she reports the cough is frequently dry.  She denies wheeze at today's visit.  She is currently using her Xopenex inhaler a couple of times when she is in distress which is moderately effective.  Allergic rhinitis is reported as moderately well controlled. She is currently using Flonase nasal spray on an as-needed basis and cetirizine 10 mg once a day.  She reports red dry itchy eyes.  She is currently taking Systane eyedrops which were recommended by her ophthalmologist with moderate relief.  Her medications are listed in the chart.   Drug Allergies:  Allergies  Allergen Reactions  . Ciprofloxacin Swelling    REACTION: tongue swells  . Milk-Related Compounds Swelling    Throat feels tight  . Mushroom Extract Complex Other (See Comments) and Anaphylaxis    Didn't feel right Per allergist do not take  . Sulfa Antibiotics Rash, Other (See Comments) and Shortness Of Breath    Other reaction(s): SHORTNESS OF BREATH Other reaction(s): SHORTNESS OF BREATH  Other reaction(s): SHORTNESS OF  BREATH  . Telmisartan Swelling    Tongue swelling, Micardis  . Ace Inhibitors Cough  . Aspirin Hives and Other (See Comments)    flushing flushing  . Avelox [Moxifloxacin Hcl In Nacl] Itching       . Azithromycin     Lip swelling, SOB.     . Beta Adrenergic Blockers Other (See Comments)    Feels like chest tightening labetalol, bystolic  Feels like chest tightening "Metoprolol"   . Buspar [Buspirone] Other (See Comments)    Light headed  . Butorphanol Tartrate Other (See Comments)    Patient aggitated  . Cetirizine Hives and Rash       . Clonidine Hcl     REACTION: makes blood pressure high  . Codeine Other (See Comments)    Feels sob  . Cortisone     Feels like she is going crazy  . Erythromycin Rash  . Fentanyl Other (See Comments)    aggressive   . Fluoxetine Hcl Other (See Comments)    REACTION: headaches  . Ketorolac Tromethamine     jittery  . Lidocaine Other (See Comments)    When it involves the throat,   . Lisinopril Cough    Other reaction(s): Cough REACTION: cough REACTION: cough  . Metoclopramide Hcl Other (See Comments)    Dystonic reaction  . Montelukast Other (See Comments)    Singulair  . Naproxen Other (See Comments)    FLUSHING  . Paroxetine Other (See Comments)    REACTION:  headaches  . Penicillins Rash  . Pravastatin Other (See Comments)    Myalgias Myalgias  . Promethazine Other (See Comments)    Dystonic reaction  . Promethazine Hcl Other (See Comments)    jittery  . Quinolones Swelling and Rash  . Serotonin Reuptake Inhibitors (Ssris) Other (See Comments)    Headache Effexor, prozac, zoloft,   . Sertraline Hcl     REACTION: headaches  . Stelazine [Trifluoperazine] Other (See Comments)    Dystonic reaction  . Tobramycin Itching    itching , rash  . Trifluoperazine Hcl     dystonic  . Versed [Midazolam]     agitation  . Whey     Milk allergy  . Montelukast Sodium     Unknown"Singulair"  . Propoxyphene     Other  reaction(s): Other (See Comments) Uncoded Allergy. Allergen: IRON IV, Other Reaction: Not Assessed Other reaction(s): Other (See Comments) Uncoded Allergy. Allergen: steriods, Other Reaction: Not Assessed  . Adhesive [Tape] Rash    EKG monitor patches, some tapes"reddnes,blisters"  . Butorphanol Anxiety    Patient agitated  . Ceftriaxone Rash    rocephin  . Erythromycin Base Itching and Rash  . Iron Rash    Flushing with certain IV types  . Metoclopramide Itching and Other (See Comments)    Dystonic reaction  . Metronidazole Rash  . Other Other (See Comments) and Rash    Other reaction(s): Other (See Comments) Uncoded Allergy. Allergen: IRON IV, Other Reaction: Not Assessed Other reaction(s): Other (See Comments) Uncoded Allergy. Allergen: steriods, Other Reaction: Not Assessed Uncoded Allergy. Allergen: IRON IV, Other Reaction: Not Assessed Uncoded Allergy. Allergen: steriods, Other Reaction: Not Assessed Other reaction(s): Flushing (ALLERGY/intolerance), GI Upset (intolerance), Hypertension (intolerance), Increased Heart Rate (intolerance), Mental Status Changes (intolerance), Other (See Comments), Tachycardia / Palpitations(intolerance) Hospital gowns leave a rash. Anything sticky leaves a rash. Heart monitor tapes cause a very bad rash. Antiemetics makes jittery. Anti-nausea medication causes unknown reaction--PT can only take Zofran. All pain medication has unknown reaction. Antibiotics cause unknown reaction--except Levaquin. Steroids cause hives and redness.  . Prednisone Anxiety and Palpitations  . Prochlorperazine Anxiety    Compazine:  Dystonic reaction  . Venlafaxine Anxiety  . Zyrtec [Cetirizine Hcl] Rash    All over body    Physical Exam: BP (!) 140/100   Pulse 96   Temp 98.3 F (36.8 C) (Oral)   Resp 20   LMP 06/25/2013   SpO2 97%    Physical Exam  Constitutional: She is oriented to person, place, and time. She appears well-developed and  well-nourished.  HENT:  Head: Normocephalic and atraumatic.  Right Ear: External ear normal.  Left Ear: External ear normal.  Bilateral nares slightly erythematous and edematous with clear nasal drainage noted.  She does have a deviation of the septum.  Eyes normal.  Ears normal.  Pharynx slightly erythematous no exudate noted  Eyes: Conjunctivae are normal.  Neck: Normal range of motion.  Cardiovascular: Normal rate, regular rhythm and normal heart sounds.  No murmur noted  Pulmonary/Chest: Effort normal and breath sounds normal.  Lungs clear to auscultation  Musculoskeletal: Normal range of motion.  Neurological: She is alert and oriented to person, place, and time.  Skin: Skin is warm and dry.  Psychiatric: She has a normal mood and affect. Her behavior is normal. Judgment and thought content normal.    Diagnostics: FVC 2.77, FEV1 2.43.  Predicted FVC 3.24, predicted FEV1 2.55.  Spirometry is within the normal range.  Assessment and Plan:  1. Mild persistent asthma without complication   2. Current use of beta blocker   3. Vocal cord dysfunction   4. Gastroesophageal reflux disease without esophagitis   5. Other allergic rhinitis   6. Essential hypertension   7. Ventricular tachycardia ([place])     Meds ordered this encounter  Medications  . levalbuterol (XOPENEX) 1.25 MG/3ML nebulizer solution    Sig: 1 vial in nebulizer every 6 hours as needed for coughing or wheezing.    Dispense:  90 mL    Refill:  1    Patient Instructions  Xopenex 2 puffs every 6 hours if needed for cough or wheeze Continue ranitidine 150 mg twice a day to control reflux Continue Mucinex 1200 mg twice a day to thin post nasal drip Increase fluids as tolerated to thin secretions Begin Nasonex nasal spray two sprays in each nostril twice a day for a stuffy nose Finish the decadron provided by the ED last night Call us if she is not doing well on this treatment plan Continue on her other  medications Follow up for your blood pressure with your primary care provider. If you have an allergic reaction,  take Benadryl 50 mg every 4 hours and if you have life-threatening symptoms inject  with EpiPen 0.3 mg. Then write what you had to eat or drink in the previous 4 hours  Follow up in 6 months or sooner if needed   Return in about 6 months (around 05/21/2018), or if symptoms worsen or fail to improve.   Thank you for the opportunity to care for this patient.  Please do not hesitate to contact me with questions.  Gareth Morgan, FNP [facility]  I have provided oversight concerning Gareth Morgan' evaluation and treatment of this patient's health issues addressed during today's encounter. I agree with the assessment and therapeutic plan as outlined in the note.   Thank you for the opportunity to care for this patient.  Please do not hesitate to contact me with questions.  Penne Lash, M.D.  [facility] [address] [place], [place] [address] [phone]

## 2017-11-21 ENCOUNTER — Telehealth: Payer: Self-pay | Admitting: Allergy

## 2017-11-21 ENCOUNTER — Other Ambulatory Visit: Payer: Self-pay | Admitting: Allergy

## 2017-11-21 ENCOUNTER — Telehealth: Payer: Self-pay | Admitting: *Deleted

## 2017-11-21 ENCOUNTER — Ambulatory Visit (INDEPENDENT_AMBULATORY_CARE_PROVIDER_SITE_OTHER): Payer: Medicare HMO | Admitting: Family Medicine

## 2017-11-21 ENCOUNTER — Encounter: Payer: Self-pay | Admitting: Family Medicine

## 2017-11-21 VITALS — BP 143/85 | HR 76 | Temp 99.6°F | Ht 62.0 in | Wt 219.0 lb

## 2017-11-21 DIAGNOSIS — J4520 Mild intermittent asthma, uncomplicated: Secondary | ICD-10-CM

## 2017-11-21 DIAGNOSIS — Z888 Allergy status to other drugs, medicaments and biological substances status: Secondary | ICD-10-CM | POA: Diagnosis not present

## 2017-11-21 DIAGNOSIS — J40 Bronchitis, not specified as acute or chronic: Secondary | ICD-10-CM | POA: Diagnosis not present

## 2017-11-21 DIAGNOSIS — J209 Acute bronchitis, unspecified: Secondary | ICD-10-CM | POA: Diagnosis not present

## 2017-11-21 DIAGNOSIS — R05 Cough: Secondary | ICD-10-CM | POA: Diagnosis not present

## 2017-11-21 DIAGNOSIS — G35 Multiple sclerosis: Secondary | ICD-10-CM | POA: Diagnosis not present

## 2017-11-21 DIAGNOSIS — K5901 Slow transit constipation: Secondary | ICD-10-CM | POA: Diagnosis not present

## 2017-11-21 DIAGNOSIS — Z885 Allergy status to narcotic agent status: Secondary | ICD-10-CM | POA: Diagnosis not present

## 2017-11-21 DIAGNOSIS — Z880 Allergy status to penicillin: Secondary | ICD-10-CM | POA: Diagnosis not present

## 2017-11-21 DIAGNOSIS — Z87891 Personal history of nicotine dependence: Secondary | ICD-10-CM | POA: Diagnosis not present

## 2017-11-21 DIAGNOSIS — Z8589 Personal history of malignant neoplasm of other organs and systems: Secondary | ICD-10-CM | POA: Diagnosis not present

## 2017-11-21 DIAGNOSIS — Z87892 Personal history of anaphylaxis: Secondary | ICD-10-CM | POA: Diagnosis not present

## 2017-11-21 DIAGNOSIS — Z79899 Other long term (current) drug therapy: Secondary | ICD-10-CM | POA: Diagnosis not present

## 2017-11-21 DIAGNOSIS — Z882 Allergy status to sulfonamides status: Secondary | ICD-10-CM | POA: Diagnosis not present

## 2017-11-21 MED ORDER — MOMETASONE FUROATE 50 MCG/ACT NA SUSP: 2.0000 | Freq: Every day | NASAL | 1 refills | Status: DC

## 2017-11-21 MED ORDER — LEVALBUTEROL TARTRATE 45 MCG/ACT IN AERO: 2.0000 | INHALATION_SPRAY | Freq: Four times a day (QID) | RESPIRATORY_TRACT | 1 refills | Status: DC | PRN

## 2017-11-21 MED ORDER — CLINDAMYCIN HCL 150 MG PO CAPS: 150.0000 mg | ORAL_CAPSULE | Freq: Three times a day (TID) | ORAL | 0 refills | Status: DC

## 2017-11-21 MED ORDER — AZELASTINE HCL 0.1 % NA SOLN: 1.0000 | Freq: Two times a day (BID) | NASAL | 2 refills | Status: DC

## 2017-11-21 NOTE — Telephone Encounter (Signed)
Jennifer [name], Jennifer [name] called and said some of her meds didn't get sent in. I sent Mometasone, Xopenex, and Azelastine. Patient was asking about some kind of Pulmonary test they did at hospital with  medicine to see if she had real asthma. I am sorry I didn't  know what kind of test she was talking about.Thank you.

## 2017-11-21 NOTE — Telephone Encounter (Signed)
Called and spoke with the patient, patient is sick and cancelled her appt. Patient is to see a doctor at [facility] in [place]. She will call back if she needs an appt.
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
## 2017-11-21 NOTE — Patient Instructions (Addendum)
Go ahead and use the Robitussin-DM to help control the cough. Sure to drink plenty of water that will help keep the mucus loose. Use her nebulizer 3 times a day in the morning, around lunchtime, and around dinnertime.  Do not use it right before bedtime unless she absolutely needed as it will likely keep you awake. Okay to take the clindamycin 2 or 3 times per days.

## 2017-11-22 ENCOUNTER — Ambulatory Visit: Payer: Self-pay | Admitting: Family Medicine

## 2017-11-22 NOTE — Telephone Encounter (Signed)
Thank you for sending the medications. Will you please confirm that the medications are available to her at the pharmacy please? Thanks. Can you also please contact her and tell her that Dr. [name] Sine said that she can get the methylcholine challenge test that shows if she has asthma or not through her pulmonologist. Thank you

## 2017-11-23 ENCOUNTER — Ambulatory Visit: Payer: Self-pay | Admitting: Obstetrics

## 2017-11-24 DIAGNOSIS — B9789 Other viral agents as the cause of diseases classified elsewhere: Secondary | ICD-10-CM | POA: Diagnosis not present

## 2017-11-24 DIAGNOSIS — R05 Cough: Secondary | ICD-10-CM | POA: Diagnosis not present

## 2017-11-24 DIAGNOSIS — R079 Chest pain, unspecified: Secondary | ICD-10-CM | POA: Diagnosis not present

## 2017-11-24 DIAGNOSIS — J069 Acute upper respiratory infection, unspecified: Secondary | ICD-10-CM | POA: Diagnosis not present

## 2017-11-24 DIAGNOSIS — R69 Illness, unspecified: Secondary | ICD-10-CM | POA: Diagnosis not present

## 2017-11-24 DIAGNOSIS — R0789 Other chest pain: Secondary | ICD-10-CM | POA: Diagnosis not present

## 2017-11-26 MED ORDER — AZELASTINE HCL 0.1 % NA SOLN: 1.0000 | Freq: Two times a day (BID) | NASAL | 2 refills | Status: DC

## 2017-11-26 NOTE — Telephone Encounter (Signed)
Can you refer this patient ro pulmonology? She needs a methylcholine challenge

## 2017-11-26 NOTE — Telephone Encounter (Signed)
I just sent in the [place]. Can you please let her know the medication will be at her pharmacy. Also have her continue to use Xopenex HFA. I will see what I can do about a referral to pulmonology

## 2017-11-26 NOTE — Telephone Encounter (Signed)
Called patient said she got all her meds except the Azelastine. She said she thought they had to order it. She said she did not have a pulmonologist. Jennifer [name] she is still coughing bad.She said the Xopenex nebulizer made her feel funny. Said the Xopenex HFA was ok.

## 2017-11-27 ENCOUNTER — Other Ambulatory Visit: Payer: Self-pay

## 2017-11-27 ENCOUNTER — Emergency Department (HOSPITAL_BASED_OUTPATIENT_CLINIC_OR_DEPARTMENT_OTHER)
Admission: EM | Admit: 2017-11-27 | Discharge: 2017-11-27 | Disposition: A | Discharge status: Home / Self Care | Payer: Medicare HMO | Attending: Emergency Medicine | Admitting: Emergency Medicine

## 2017-11-27 ENCOUNTER — Encounter (HOSPITAL_BASED_OUTPATIENT_CLINIC_OR_DEPARTMENT_OTHER): Payer: Self-pay | Admitting: *Deleted

## 2017-11-27 ENCOUNTER — Telehealth: Payer: Self-pay | Admitting: Allergy

## 2017-11-27 DIAGNOSIS — I10 Essential (primary) hypertension: Secondary | ICD-10-CM | POA: Insufficient documentation

## 2017-11-27 DIAGNOSIS — R05 Cough: Secondary | ICD-10-CM

## 2017-11-27 DIAGNOSIS — J4530 Mild persistent asthma, uncomplicated: Secondary | ICD-10-CM | POA: Diagnosis not present

## 2017-11-27 DIAGNOSIS — Z87891 Personal history of nicotine dependence: Secondary | ICD-10-CM | POA: Insufficient documentation

## 2017-11-27 DIAGNOSIS — J40 Bronchitis, not specified as acute or chronic: Secondary | ICD-10-CM | POA: Diagnosis not present

## 2017-11-27 DIAGNOSIS — Z79899 Other long term (current) drug therapy: Secondary | ICD-10-CM | POA: Diagnosis not present

## 2017-11-27 DIAGNOSIS — R059 Cough, unspecified: Secondary | ICD-10-CM

## 2017-11-27 DIAGNOSIS — R0602 Shortness of breath: Secondary | ICD-10-CM | POA: Diagnosis not present
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
## 2017-11-27 NOTE — Discharge Instructions (Signed)
1.  Complete your antibiotics as prescribed.  Continue your inhalers and Robitussin as needed. 2.  Sometimes chronic and persistent cough can be caused by reflux.  Take your ranitidine twice daily for 2 weeks to see if her symptoms improve. 3.  As you been having persistent symptoms despite treatment, you should follow-up with a pulmonologist as well as your family doctor.  Schedule appointment appointment as soon as possible.

## 2017-11-27 NOTE — Telephone Encounter (Signed)
Per [name], schedule patient with Dr. Camillo Flaming at [facility]  Scheduled patient for 01/11/18 at 9:20 am.  Patient to arrive 15 minutes early, bring insurance card, any copays, ID, and list of all current medications. Faxed last 3 OV notes to [phone] for Dr. Camillo Flaming to review.  [name] informed. Called patient and reviewed all appointment information.  Patient verbalized understanding.  [facility] [address] [place], [place]  [address] [phone] phone [phone] fax Dr. Francena Hanly

## 2017-11-27 NOTE — ED Triage Notes (Signed)
Pt c/o SOB and pro cough x 2 weeks

## 2017-11-27 NOTE — Telephone Encounter (Signed)
[name], [facility] Guiles called and wanted you to refer her to a Pulmonologist. She said she thought it was [facility] but mow may be [facility]. Her phone number is [phone]. She said she was not any better.

## 2017-11-27 NOTE — Telephone Encounter (Signed)
Has she seen a pulmonologist before in [place] or [place] . Where does she she go for treatment of her cancer

## 2017-11-28 ENCOUNTER — Encounter: Payer: Self-pay | Admitting: Family Medicine

## 2017-11-28 ENCOUNTER — Ambulatory Visit (INDEPENDENT_AMBULATORY_CARE_PROVIDER_SITE_OTHER): Payer: Medicare HMO | Admitting: Family Medicine

## 2017-11-28 VITALS — BP 138/74 | HR 75 | Ht 62.0 in | Wt 220.0 lb

## 2017-11-28 DIAGNOSIS — R05 Cough: Secondary | ICD-10-CM | POA: Diagnosis not present

## 2017-11-28 DIAGNOSIS — R140 Abdominal distension (gaseous): Secondary | ICD-10-CM | POA: Diagnosis not present

## 2017-11-28 DIAGNOSIS — R0602 Shortness of breath: Secondary | ICD-10-CM | POA: Diagnosis not present

## 2017-11-28 DIAGNOSIS — R52 Pain, unspecified: Secondary | ICD-10-CM

## 2017-11-28 DIAGNOSIS — R058 Other specified cough: Secondary | ICD-10-CM

## 2017-11-28 DIAGNOSIS — L905 Scar conditions and fibrosis of skin: Secondary | ICD-10-CM | POA: Diagnosis not present

## 2017-11-28 MED ORDER — PANTOPRAZOLE SODIUM 20 MG PO TBEC
20.0000 mg | DELAYED_RELEASE_TABLET | Freq: Every day | ORAL | 2 refills | Status: DC
Start: 2017-11-28 — End: 2017-12-06
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
## 2017-11-30 ENCOUNTER — Other Ambulatory Visit: Payer: Self-pay | Admitting: *Deleted

## 2017-11-30 NOTE — Patient Outreach (Signed)
[facility]) Care Management  11/30/2017  [name] Oct 17, 1965 [id number]   Telephone Screen  Referral Date:  11/30/2017 Referral Source:  [facility] ED Census Reason for Referral:  6 or more ED visits in the past 6 months Insurance:  [employer]   Outreach Attempt:  Patient has previously requested not to be called by [facility] and requested to be added to the do not call list.  Plan: [place] will mark patient not active and close case. RN Health Coach will notify [place] Management Assistant to place patient on the do not call list per her previous request.  Hubert Azure RN [place] [phone] [email or web address]

## 2017-12-02 IMAGING — CR DG FINGER LITTLE 2+V*R*
3 series · 3 of 3 positions shown · non-contrast
Comparison: Right hand radiographs on 12/21/2014

CLINICAL DATA: Right fifth finger injury with swelling and pain.
Initial encounter.

EXAM:
RIGHT LITTLE FINGER 2+V

[x finger pa right]
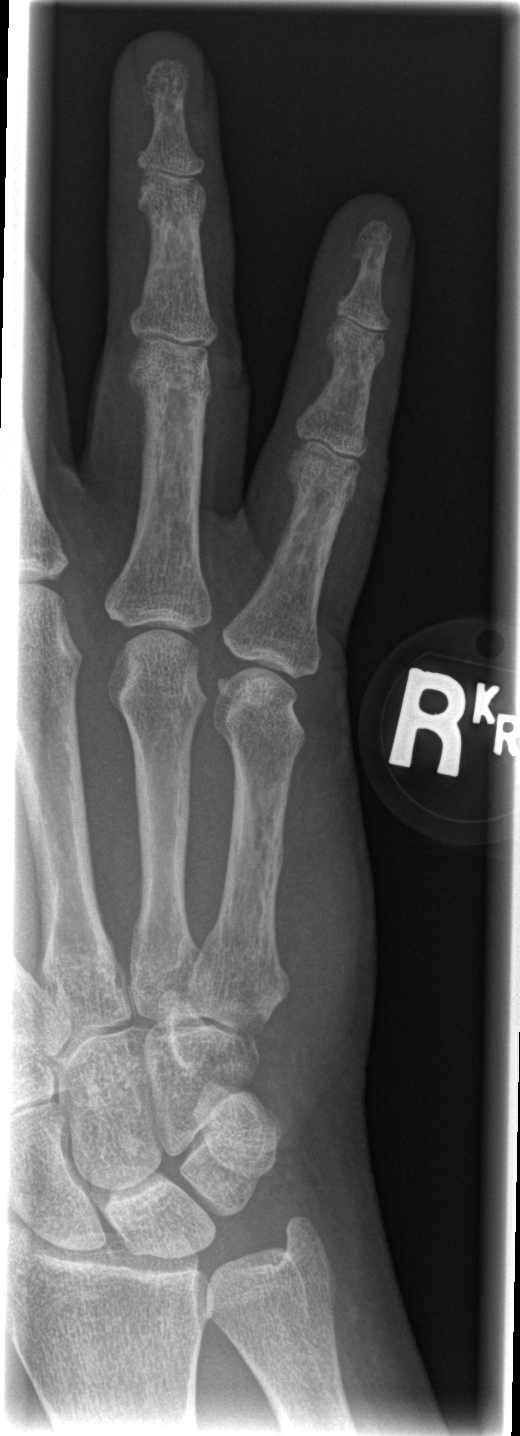

[x finger obl. right]
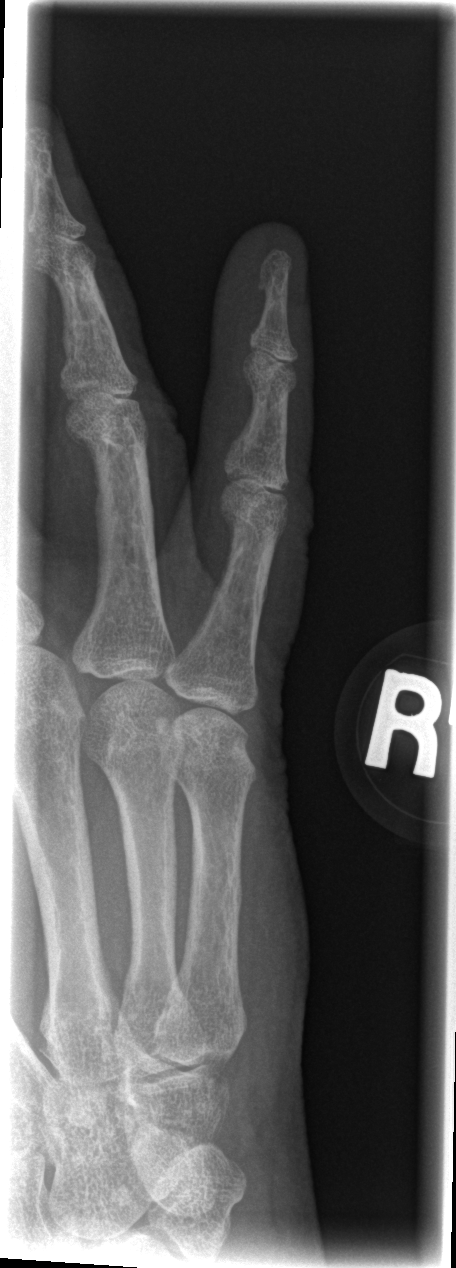

[x finger lateral right]
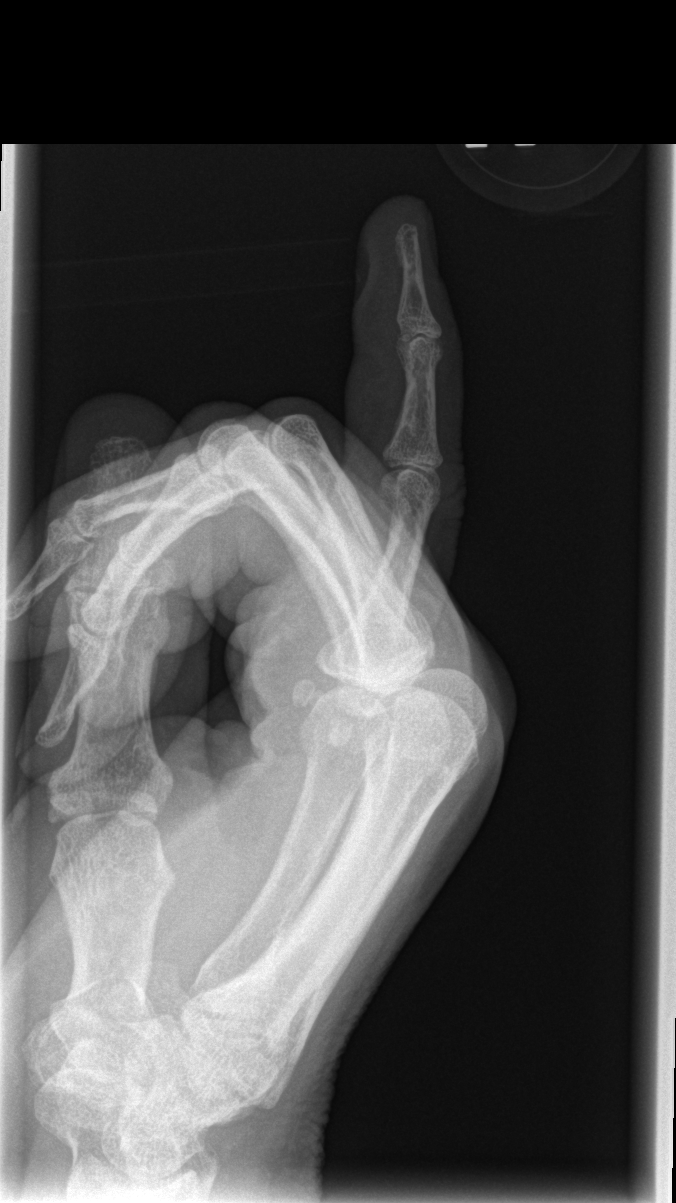

[3 of 3 positions shown; findings below may reference images not displayed]

FINDINGS: There is no evidence of fracture or dislocation. There is no
evidence of arthropathy or other focal bone abnormality. Soft
tissues are unremarkable.
IMPRESSION: Negative.

## 2017-12-05 DIAGNOSIS — R61 Generalized hyperhidrosis: Secondary | ICD-10-CM | POA: Diagnosis not present

## 2017-12-05 DIAGNOSIS — R072 Precordial pain: Secondary | ICD-10-CM | POA: Diagnosis not present

## 2017-12-05 DIAGNOSIS — R079 Chest pain, unspecified: Secondary | ICD-10-CM | POA: Diagnosis not present

## 2017-12-05 DIAGNOSIS — R51 Headache: Secondary | ICD-10-CM | POA: Diagnosis not present

## 2017-12-05 DIAGNOSIS — I10 Essential (primary) hypertension: Secondary | ICD-10-CM | POA: Diagnosis not present

## 2017-12-05 DIAGNOSIS — R002 Palpitations: Secondary | ICD-10-CM | POA: Diagnosis not present

## 2017-12-05 DIAGNOSIS — Z87891 Personal history of nicotine dependence: Secondary | ICD-10-CM | POA: Diagnosis not present
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
## 2017-12-06 ENCOUNTER — Encounter: Payer: Self-pay | Admitting: Family Medicine

## 2017-12-06 ENCOUNTER — Ambulatory Visit (INDEPENDENT_AMBULATORY_CARE_PROVIDER_SITE_OTHER): Payer: Medicare HMO | Admitting: Family Medicine

## 2017-12-06 ENCOUNTER — Telehealth: Payer: Self-pay

## 2017-12-06 VITALS — BP 166/86 | HR 78 | Ht 62.0 in | Wt 218.0 lb

## 2017-12-06 DIAGNOSIS — R635 Abnormal weight gain: Secondary | ICD-10-CM

## 2017-12-06 DIAGNOSIS — R21 Rash and other nonspecific skin eruption: Secondary | ICD-10-CM | POA: Diagnosis not present

## 2017-12-06 DIAGNOSIS — M79672 Pain in left foot: Secondary | ICD-10-CM

## 2017-12-06 DIAGNOSIS — R7303 Prediabetes: Secondary | ICD-10-CM | POA: Diagnosis not present

## 2017-12-06 DIAGNOSIS — I471 Supraventricular tachycardia: Secondary | ICD-10-CM | POA: Diagnosis not present

## 2017-12-06 DIAGNOSIS — I10 Essential (primary) hypertension: Secondary | ICD-10-CM | POA: Diagnosis not present

## 2017-12-06 DIAGNOSIS — R002 Palpitations: Secondary | ICD-10-CM

## 2017-12-06 DIAGNOSIS — Z6839 Body mass index (BMI) 39.0-39.9, adult: Secondary | ICD-10-CM | POA: Diagnosis not present

## 2017-12-06 DIAGNOSIS — R079 Chest pain, unspecified: Secondary | ICD-10-CM | POA: Diagnosis not present

## 2017-12-06 DIAGNOSIS — M79671 Pain in right foot: Secondary | ICD-10-CM | POA: Diagnosis not present

## 2017-12-06 LAB — IRON,TIBC AND FERRITIN PANEL
%SAT: 12
Ferritin: 127.5
Iron: 45
TIBC: 372

## 2017-12-06 LAB — FOLATE: Folate: 17.1

## 2017-12-06 LAB — CBC AND DIFFERENTIAL
HCT: 41 (ref 36–46)
Hemoglobin: 13.9 (ref 12.0–16.0)
Platelets: 218 (ref 150–399)
WBC: 6.5

## 2017-12-06 LAB — VITAMIN B12: Vitamin B-12: 301

## 2017-12-06 LAB — POCT GLYCOSYLATED HEMOGLOBIN (HGB A1C): Hemoglobin A1C: 6.1

## 2017-12-06 MED ORDER — OMEPRAZOLE 20 MG PO CPDR: 20.0000 mg | DELAYED_RELEASE_CAPSULE | Freq: Every day | ORAL | 0 refills | Status: DC

## 2017-12-06 NOTE — Telephone Encounter (Signed)
Pharmacy called and states Jennifer [name] states she has already tried and failed Protonix in the past. She would like Omeprazole instead. Please advise.

## 2017-12-06 NOTE — Telephone Encounter (Signed)
Error

## 2017-12-06 NOTE — Telephone Encounter (Signed)
New rx sent

## 2017-12-07 ENCOUNTER — Other Ambulatory Visit: Payer: Self-pay | Admitting: Family Medicine

## 2017-12-08 DIAGNOSIS — M25531 Pain in right wrist: Secondary | ICD-10-CM | POA: Diagnosis not present

## 2017-12-08 DIAGNOSIS — S6992XA Unspecified injury of left wrist, hand and finger(s), initial encounter: Secondary | ICD-10-CM | POA: Diagnosis not present

## 2017-12-08 DIAGNOSIS — M25532 Pain in left wrist: Secondary | ICD-10-CM | POA: Diagnosis not present

## 2017-12-08 DIAGNOSIS — S3992XA Unspecified injury of lower back, initial encounter: Secondary | ICD-10-CM | POA: Diagnosis not present

## 2017-12-08 DIAGNOSIS — M533 Sacrococcygeal disorders, not elsewhere classified: Secondary | ICD-10-CM | POA: Diagnosis not present

## 2017-12-08 DIAGNOSIS — Y999 Unspecified external cause status: Secondary | ICD-10-CM | POA: Diagnosis not present

## 2017-12-08 DIAGNOSIS — Y9351 Activity, roller skating (inline) and skateboarding: Secondary | ICD-10-CM | POA: Diagnosis not present

## 2017-12-08 DIAGNOSIS — M545 Low back pain: Secondary | ICD-10-CM | POA: Diagnosis not present

## 2017-12-08 IMAGING — DX DG LUMBAR SPINE COMPLETE 4+V
5 series · 5 of 5 positions shown · non-contrast
Comparison: MRI 08/10/2016 .

CLINICAL DATA: Chronic pain.  Headaches.

EXAM:
LUMBAR SPINE - COMPLETE 4+ VIEW

[l-spine ap]
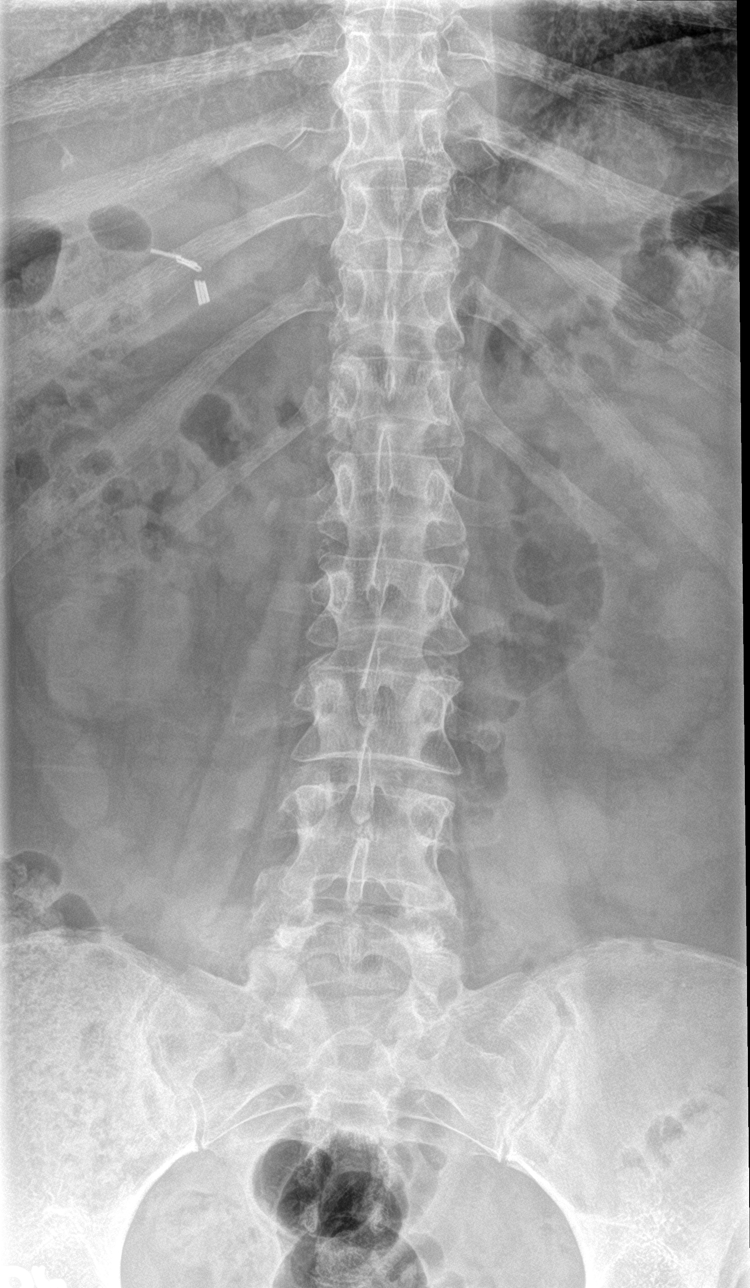

[l-spine obl (1 of 2)]
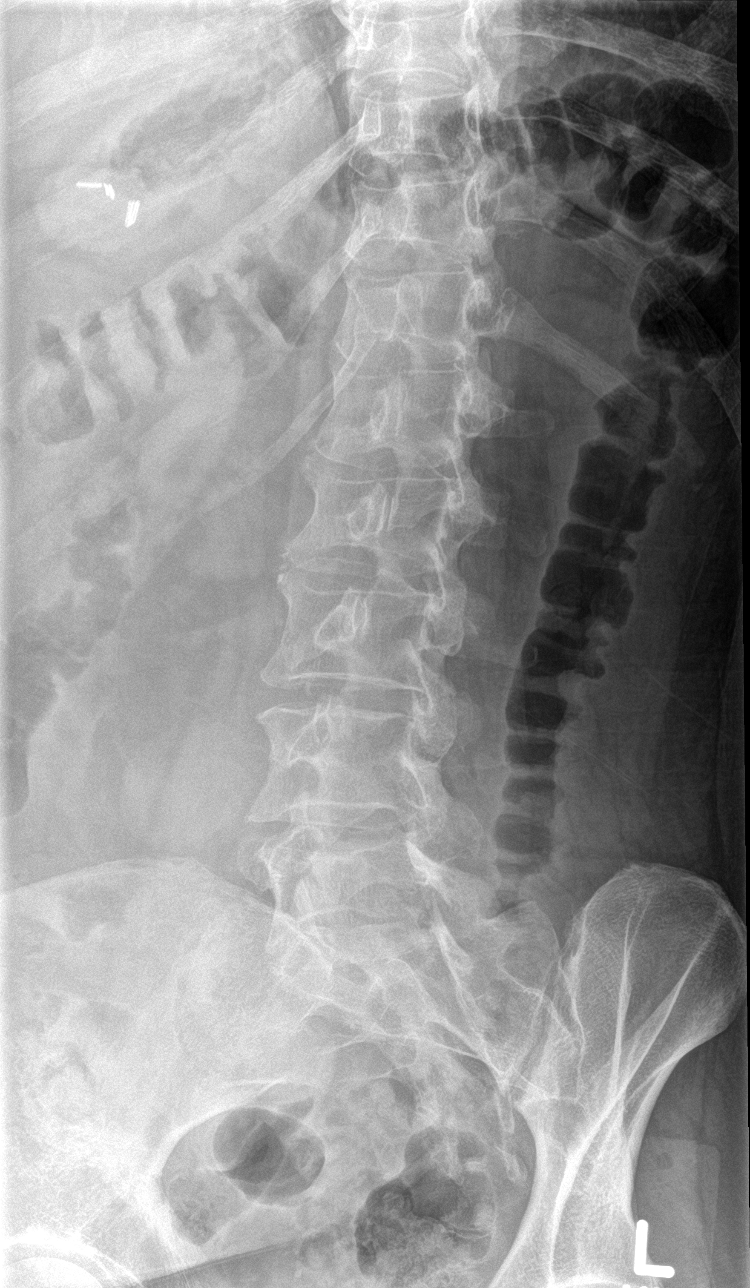

[l-spine obl (2 of 2)]
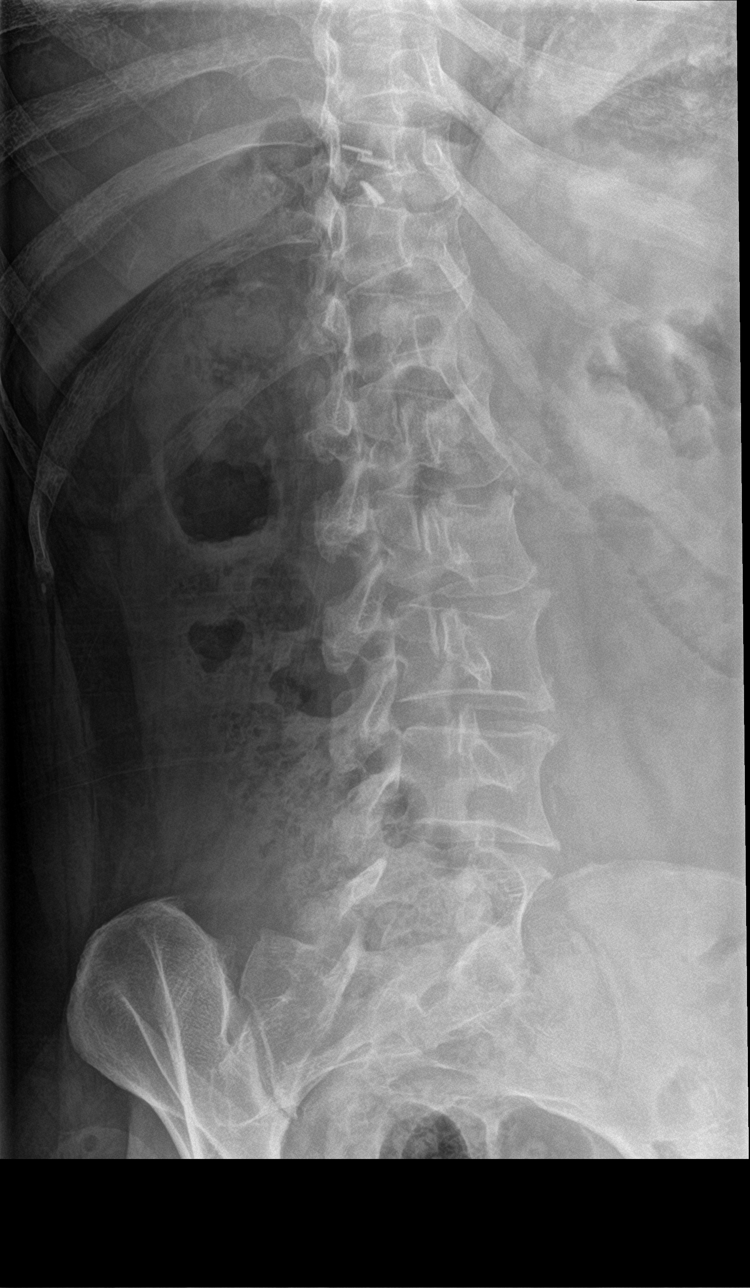

[l-spine lat]
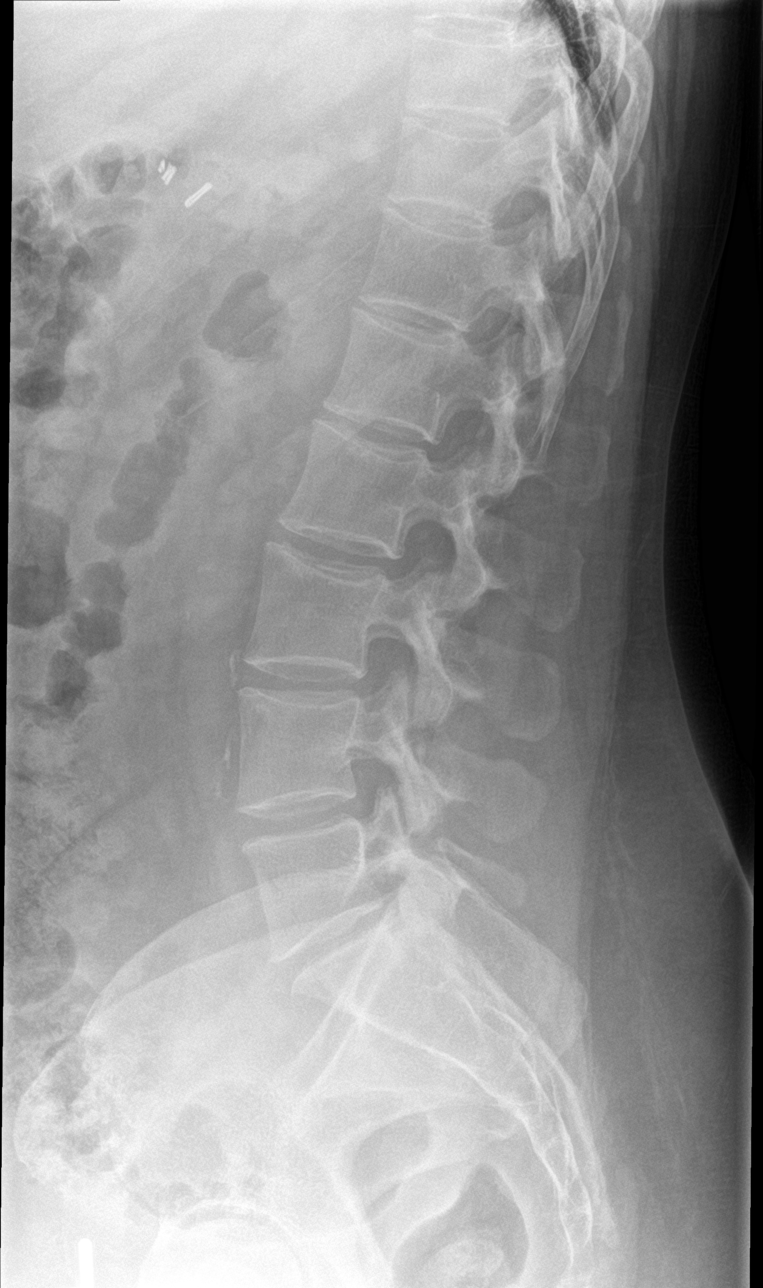

[l-spine spot]
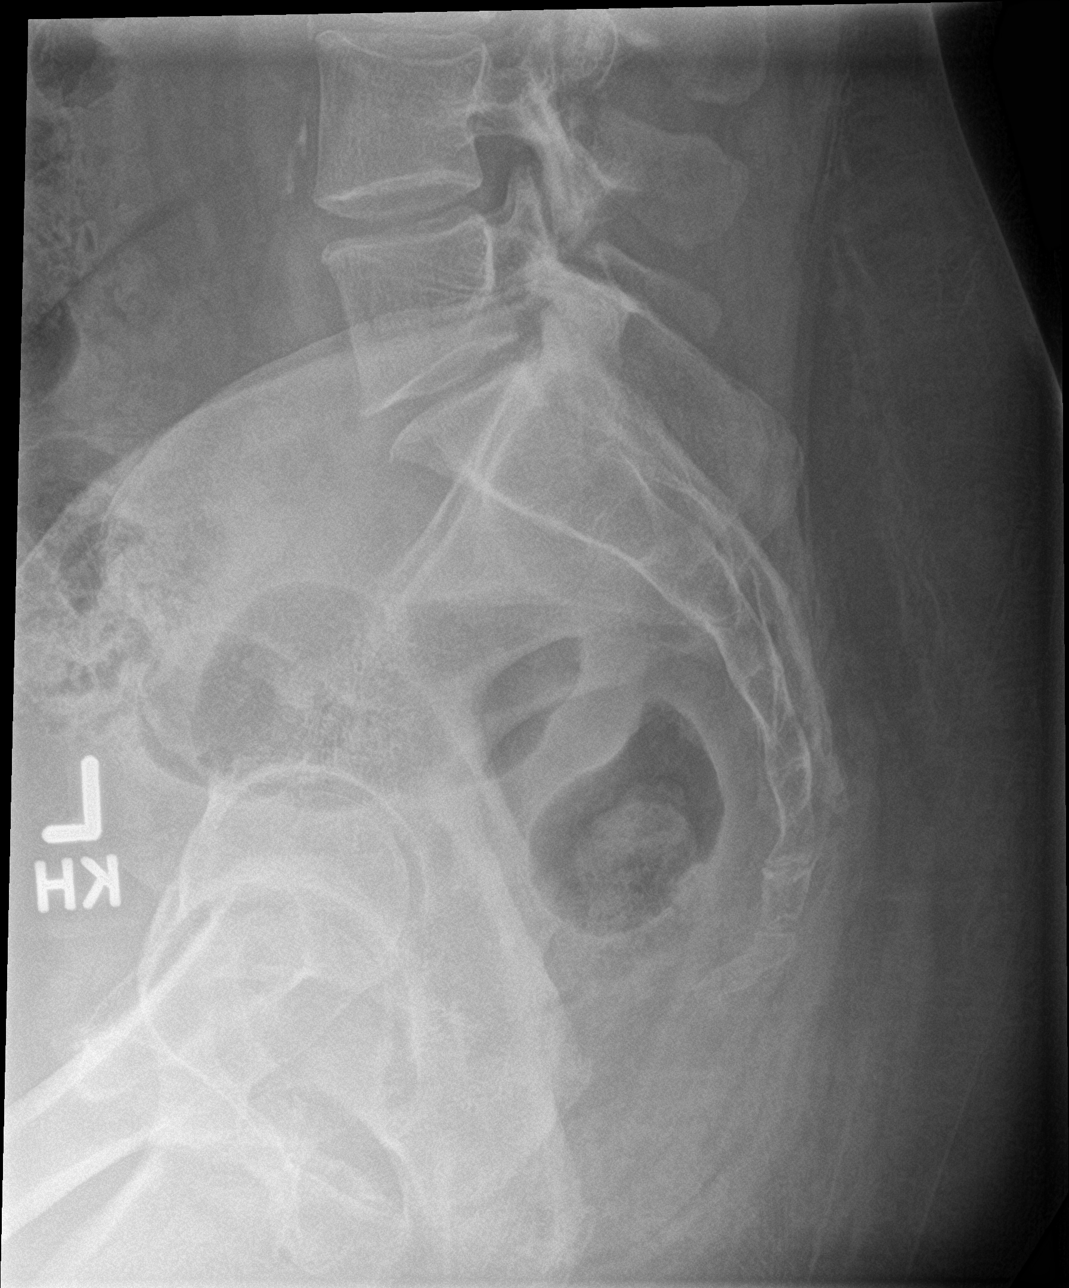

[5 of 5 positions shown; findings below may reference images not displayed]

FINDINGS: Mild scoliosis concave right. Diffuse multilevel degenerative
change. No acute abnormality identified. Surgical clips upper
abdomen. Abdominal aortic atherosclerotic vascular calcification.
IMPRESSION: 1. Mild scoliosis concave right. Diffuse mild degenerative change.
No acute bony abnormality.

2.  Aortic atherosclerotic vascular disease.

## 2017-12-10 ENCOUNTER — Ambulatory Visit (INDEPENDENT_AMBULATORY_CARE_PROVIDER_SITE_OTHER): Payer: Medicare HMO | Admitting: Family Medicine

## 2017-12-10 ENCOUNTER — Encounter: Payer: Self-pay | Admitting: Family Medicine

## 2017-12-10 ENCOUNTER — Telehealth: Payer: Self-pay

## 2017-12-10 VITALS — BP 141/87 | HR 76 | Ht 62.0 in | Wt 218.0 lb

## 2017-12-10 DIAGNOSIS — M25532 Pain in left wrist: Secondary | ICD-10-CM | POA: Diagnosis not present

## 2017-12-10 DIAGNOSIS — M533 Sacrococcygeal disorders, not elsewhere classified: Secondary | ICD-10-CM

## 2017-12-10 DIAGNOSIS — M545 Low back pain, unspecified: Secondary | ICD-10-CM

## 2017-12-10 DIAGNOSIS — E876 Hypokalemia: Secondary | ICD-10-CM | POA: Diagnosis not present

## 2017-12-10 LAB — BASIC METABOLIC PANEL
BUN: 15 (ref 4–21)
Creatinine: 0.6 (ref 0.5–1.1)
Glucose: 130
Potassium: 3.9 (ref 3.4–5.3)
Sodium: 142 (ref 137–147)

## 2017-12-10 LAB — COMPLETE METABOLIC PANEL WITH GFR
CO2: 22
Chloride: 112

## 2017-12-10 LAB — VITAMIN D 25 HYDROXY (VIT D DEFICIENCY, FRACTURES): Vit D, 25-Hydroxy: 40

## 2017-12-10 MED ORDER — TRAMADOL HCL 50 MG PO TABS: 50.0000 mg | ORAL_TABLET | Freq: Three times a day (TID) | ORAL | 0 refills | Status: DC | PRN
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
## 2017-12-10 NOTE — Telephone Encounter (Signed)
Note was blank?

## 2017-12-12 ENCOUNTER — Telehealth: Payer: Self-pay | Admitting: Family Medicine

## 2017-12-12 DIAGNOSIS — I10 Essential (primary) hypertension: Secondary | ICD-10-CM | POA: Diagnosis not present

## 2017-12-12 DIAGNOSIS — G8911 Acute pain due to trauma: Secondary | ICD-10-CM | POA: Diagnosis not present

## 2017-12-12 DIAGNOSIS — K219 Gastro-esophageal reflux disease without esophagitis: Secondary | ICD-10-CM | POA: Diagnosis not present

## 2017-12-12 DIAGNOSIS — G43909 Migraine, unspecified, not intractable, without status migrainosus: Secondary | ICD-10-CM | POA: Diagnosis not present

## 2017-12-12 DIAGNOSIS — M47897 Other spondylosis, lumbosacral region: Secondary | ICD-10-CM | POA: Diagnosis not present

## 2017-12-12 DIAGNOSIS — J45909 Unspecified asthma, uncomplicated: Secondary | ICD-10-CM | POA: Diagnosis not present

## 2017-12-12 DIAGNOSIS — R69 Illness, unspecified: Secondary | ICD-10-CM | POA: Diagnosis not present

## 2017-12-12 DIAGNOSIS — M4606 Spinal enthesopathy, lumbar region: Secondary | ICD-10-CM | POA: Diagnosis not present

## 2017-12-12 DIAGNOSIS — Z043 Encounter for examination and observation following other accident: Secondary | ICD-10-CM | POA: Diagnosis not present

## 2017-12-12 DIAGNOSIS — E785 Hyperlipidemia, unspecified: Secondary | ICD-10-CM | POA: Diagnosis not present

## 2017-12-12 DIAGNOSIS — M533 Sacrococcygeal disorders, not elsewhere classified: Secondary | ICD-10-CM | POA: Diagnosis not present

## 2017-12-12 DIAGNOSIS — R7303 Prediabetes: Secondary | ICD-10-CM | POA: Diagnosis not present

## 2017-12-12 DIAGNOSIS — M2578 Osteophyte, vertebrae: Secondary | ICD-10-CM | POA: Diagnosis not present

## 2017-12-12 DIAGNOSIS — E269 Hyperaldosteronism, unspecified: Secondary | ICD-10-CM | POA: Diagnosis not present

## 2017-12-12 DIAGNOSIS — G35 Multiple sclerosis: Secondary | ICD-10-CM | POA: Diagnosis not present

## 2017-12-12 DIAGNOSIS — M545 Low back pain: Secondary | ICD-10-CM | POA: Diagnosis not present

## 2017-12-12 NOTE — Telephone Encounter (Signed)
Basic metabolic panel overall is okay.  Blood sugar was 130 but she was not fasting.  Vitamin D was 40 which is acceptable.

## 2017-12-12 NOTE — Telephone Encounter (Signed)
Pt informed of results.Jennifer [name]

## 2017-12-12 NOTE — Telephone Encounter (Signed)
Left VM for Pt to return clinic call regarding results, callback information provided. 

## 2017-12-14 ENCOUNTER — Encounter: Payer: Self-pay | Admitting: Family Medicine

## 2017-12-14 ENCOUNTER — Other Ambulatory Visit: Payer: Self-pay | Admitting: *Deleted

## 2017-12-14 ENCOUNTER — Ambulatory Visit (INDEPENDENT_AMBULATORY_CARE_PROVIDER_SITE_OTHER): Payer: Medicare HMO | Admitting: Family Medicine

## 2017-12-14 ENCOUNTER — Ambulatory Visit (INDEPENDENT_AMBULATORY_CARE_PROVIDER_SITE_OTHER): Payer: Medicare HMO

## 2017-12-14 VITALS — BP 148/77 | HR 72 | Wt 221.0 lb

## 2017-12-14 DIAGNOSIS — M533 Sacrococcygeal disorders, not elsewhere classified: Secondary | ICD-10-CM | POA: Diagnosis not present

## 2017-12-14 DIAGNOSIS — M25532 Pain in left wrist: Secondary | ICD-10-CM | POA: Diagnosis not present

## 2017-12-14 DIAGNOSIS — M545 Low back pain, unspecified: Secondary | ICD-10-CM

## 2017-12-14 DIAGNOSIS — S6992XA Unspecified injury of left wrist, hand and finger(s), initial encounter: Secondary | ICD-10-CM | POA: Diagnosis not present

## 2017-12-14 DIAGNOSIS — M25531 Pain in right wrist: Secondary | ICD-10-CM

## 2017-12-14 DIAGNOSIS — S3992XA Unspecified injury of lower back, initial encounter: Secondary | ICD-10-CM | POA: Diagnosis not present

## 2017-12-14 NOTE — Progress Notes (Signed)
Subjective:    I'm seeing this patient as a consultation for:  [name], MD   CC: Bilateral wrist pain and low back pain after fall  HPI: Jennifer [name] was in her normal state of health last week.  She was rollerskating with her children when she fell landing on her buttocks.  She was seen in the emergency department on April 27 where she had x-rays of her wrists and sacrum.  She had no definitive fracture seen on all imaging however the left wrist scaphoid bone had a small lucency.  She was treated with a thumb spica splint for conservative management of a possible radiographically occult scaphoid fracture.  In the interim she notes continued moderate to severe pain in her buttocks and low back.  She denies any pain radiating down her legs or weakness or numbness.  She denies any new bowel or bladder dysfunction.  She notes the pain is quite severe however.  She has trouble sitting and she has pain when she stands from a seated position.  Left wrist pain: She notes moderate left wrist pain.  She notes the brace is mildly helpful.  She denies any radiating pain weakness or numbness into her left hand.  Right wrist pain: She notes worsened right wrist pain.  She notes considerable bruising into her wrist.  She notes pain with wrist motion.  She is right-hand dominant.  Past medical history, Surgical history, Family history not pertinant except as noted below, Social history, Allergies, and medications have been entered into the medical record, reviewed, and no changes needed.   Review of Systems: As above  Objective:    Vitals:   12/14/17 1052  BP: (!) 148/77  Pulse: 72   General: Well Developed, well nourished, and in no acute distress.  Neuro/Psych: Alert and oriented x3, extra-ocular muscles intact, able to move all 4 extremities, sensation grossly intact. Skin: Warm and dry, no rashes noted.  Respiratory: Not using accessory muscles, speaking in full sentences, trachea  midline.  Cardiovascular: Pulses palpable, no extremity edema. Abdomen: Does not appear distended. MSK:  L-spine: Nontender to spinal midline.  Tender to palpation bilateral lumbar paraspinal muscles. Back motion limited in flexion and extension by pain. Lower extremity strength is intact. Sacrum and coccyx: Tender to palpation at coccyx. Right wrist: Ecchymosis present at the volar dorsal aspect of the wrist.  Tender to palpation at the anatomical snuffbox.  Pain with thumb and wrist motion present.  Pulses capillary refill and sensation are intact.  Left wrist: No significant ecchymosis or swelling.  Mildly tender to palpation at the radial styloid.  Wrist motion is slightly limited.  Pulses capillary refill and sensation are intact.   My personal interpretation of following x-ray images.  Formal radiology review is pending  Right wrist: Faint lucency through the distal portion of the scaphoid concerning for nondisplaced fracture.  Otherwise no acute changes present.  Left wrist: No obvious fractures noted.  No significant lucency noted in the left scaphoid.   L-spine: Degenerative changes present at L5-S1.  No other acute fractures or malalignment noted.  Coccyx: No obvious fracture present.  Impression and Recommendations:    Assessment and Plan: 52 y.o. female with  Low back pain and coccyx pain from fall.  Patient has coccydynia from fall without fracture.  We discussed treatment options including cushion pillow.  Her low back pain is myofascial disruption and dysfunction and spasm.  Physical therapy should be very helpful for this.  Referral placed.  Recheck in 2 weeks.  Right wrist pain: Patient has a history of fall with ecchymosis and tenderness.  Per report her initial x-ray last week did not show any obvious fracture.  I am concerned for a possible nondisplaced scaphoid fracture seen on today's x-ray.  Formal radiology review is pending.  Plan to treat with thumb spica splint  and recheck in 1 to 2 weeks.  If fracture is suspected after formal radiology read may be reasonable to obtain MRI or CT scan.  Left wrist pain: No obvious fracture per my review.  Formal radiology review is pending.  Patient is not particularly tender.  Plan to use thumb spica splint as needed.  Recheck in 2 weeks.   Orders Placed This Encounter  Procedures  . Ambulatory referral to Physical Therapy    Referral Priority:   Routine    Referral Type:   Physical Medicine    Referral Reason:   Specialty Services Required    Requested Specialty:   Physical Therapy   No orders of the defined types were placed in this encounter.   Discussed warning signs or symptoms. Please see discharge instructions. Patient expresses understanding.   I spent 40 minutes with this patient, greater than 50% was face-to-face time counseling regarding ddx and treatment plan.

## 2017-12-14 NOTE — Patient Instructions (Signed)
Thank you for coming in today. Attend PT.  Use the right wrist brace.  Recheck with me in 2 weeks or sooner if needed.  Use the left wrist brace as needed.    Tailbone Injury The tailbone (coccyx) is the small bone at the lower end of the spine. A tailbone injury may involve stretched ligaments, bruising, or a broken bone (fracture). Tailbone injuries can be painful, and some may take a long time to heal. What are the causes? This condition is often caused by falling and landing on the tailbone. Other causes include:  Repeated strain or friction from actions such as rowing and bicycling.  Childbirth.  In some cases, the cause may not be known. What increases the risk? This condition is more common in women than in men. What are the signs or symptoms? Symptoms of this condition include:  Pain in the lower back, especially when sitting.  Pain or difficulty when standing up from a sitting position.  Bruising in the tailbone area.  Painful bowel movements.  In women, pain during intercourse.  How is this diagnosed? This condition may be diagnosed based on your symptoms and a physical exam. X-rays may be taken if a fracture is suspected. You may also have other tests, such as a CT scan or MRI. How is this treated? This condition may be treated with medicines to help relieve your pain. Most tailbone injuries heal on their own in 4-6 weeks. However, recovery time may be longer if the injury involves a fracture. Follow these instructions at home:  Take medicines only as directed by your health care provider.  If directed, apply ice to the injured area: ? Put ice in a plastic bag. ? Place a towel between your skin and the bag. ? Leave the ice on for 20 minutes, 2-3 times per day for the first 1-2 days.  Sit on a large, rubber or inflated ring or cushion to ease your pain. Lean forward when you are sitting to help decrease discomfort.  Avoid sitting for long periods of  time.  Increase your activity as the pain allows. Perform any exercises that are recommended by your health care provider or physical therapist.  If you have pain during bowel movements, use stool softeners as directed by your health care provider.  Eat a diet that includes plenty of fiber to help prevent constipation.  Keep all follow-up visits as directed by your health care provider. This is important. How is this prevented? Wear appropriate padding and sports gear when bicycling and rowing. This can help to prevent developing an injury that is caused by repeated strain or friction. Contact a health care provider if:  Your pain becomes worse.  Your bowel movements cause a great deal of discomfort.  You are unable to have a bowel movement.  You have uncontrolled urine loss (urinary incontinence).  You have a fever. This information is not intended to replace advice given to you by your health care provider. Make sure you discuss any questions you have with your health care provider. Document Released: 07/28/2000 Document Revised: 03/30/2016 Document Reviewed: 07/27/2014 Elsevier Interactive Patient Education  2018 [employer].   Scaphoid Fracture A scaphoid fracture is a break in one of the small bones of the wrist. The scaphoid bone is located on the thumbside of the wrist. Itsupports the other seven bones that make up the wrist. The scaphoid bone has a poor blood supply, so it can take a long time to heal. You may  need to wear a cast or splint for several months. What are the causes? This injury is usually caused by a fall onto an outstretched hand and arm. This type of injury may also occur if you are in a motor vehicle collisionand you brace yourself with your hand. What increases the risk? The following factors may make you more likely to develop this injury:  Playingcontact sports.  Skiing, skating, or rollerblading.  What are the signs or symptoms? Symptoms of  this injury include:  Pain, especially when grasping or pinching with your thumb.  Pain when pressing on the base of your thumb, especially in the hollow area at the base of your thumb when your thumb is extended outward.  Swelling.  Bruising.  How is this diagnosed? This injury may be diagnosed based on:  Your history of injury.  A physical exam of your wrist and thumb.  X-rays.  CT scan or MRI. These tests are sometimes needed because this type of fracture may not show up on X-rays.  A scaphoid fracture may be hard to diagnose because pain may not start for a few days. Also, the fracture does not cause a deformity, and it may not limit movement. How is this treated? Treatment depends on the location of the fracture and whether the bone is out of place (displaced). Treatment may be surgical or nonsurgical:  You may need a cast or splint from the middle of your forearm down to your wrist. Yourthumb may be extended out and included in the cast or splint.  While your fracture is healing, it may be treated with sound waves or electricalenergy to stimulate healing.  A displaced fracture may require surgery to put the pieces of bone back in proper position. Screws or wires may be used to hold the bone in place.  You may need to do exercises (physical therapy) to restore wrist movement after your cast or splint is removed.  Follow these instructions at home: If you have a cast:  Do not stick anything inside the cast to scratch your skin. Doing that increases your risk of infection.  Check the skin around the cast every day. Report any concerns to your health care provider. You may put lotion on dry skin around the edges of the cast. Do not apply lotion to the skin underneath the cast.  Do not let your cast get wet if it is not waterproof.  Keep the cast clean. If you have a splint:  Wear the splint as told by your health care provider. Remove it only as told by your health  care provider.  Loosen the splint if your fingers tingle, become numb, or turn cold and blue.  Do not let your splint get wet if it is not waterproof.  Keep the splint clean. Bathing  Do not take baths, swim, or use a hot tub until your health care provider approves. Ask your health care provider if you can take showers. You may only be allowed to take sponge baths for bathing.  If your cast or splint is not waterproof, cover it with a watertight plastic bag when you take a bath or a shower. Managing pain, stiffness, and swelling  If directed, apply ice to the injured area. ? Put ice in a plastic bag. ? Place a towel between your skin and the bag. ? Leave the ice on for 20 minutes, 2-3 times per day.  Move your fingers often to avoid stiffness and to lessen swelling.  Raise (  elevate) the injured area above the level of your heart while you are sitting or lying down. Driving  Do not drive or operate heavy machinery while taking prescription pain medicine.  Ask your health care provider when it is safe to drive if you have a cast or splint on a hand that you use for driving. Activity  Return to your normal activities as told by your health care provider. Ask your health care provider what activities are safe for you. You may need to limit activities such as contact sports, throwing, pushing, climbing, and usingvibrating machinery.  Do not lift anything that is heavier than 1 lb (0.5 kg) with the affected hand until your health care provider tells you that it is safe.  Do exercises only as told by your health care provider. General instructions  Do not put pressure on any part of the cast or splint until it is fully hardened. This may take several hours.  Take over-the-counter and prescription medicines only as told by your health care provider.  Do not use any tobacco products, including cigarettes, chewing tobacco, or e-cigarettes. Tobacco can delay bone healing. If you need  help quitting, ask your health care provider.  Keep all follow-up visits as told by your health care provider. This is important. Contact a health care provider if:  Your pain or swelling gets worse even though you have had treatment.  You have pain, numbness, or coldness in your hand or fingers.  Your cast or splint becomes loose or damaged. Get help right away if:  You lose feeling in your hand or fingers.  Your fingers or fingernails turn pale or blue. This information is not intended to replace advice given to you by your health care provider. Make sure you discuss any questions you have with your health care provider. Document Released: 07/21/2002 Document Revised: 01/06/2016 Document Reviewed: 02/10/2015 Elsevier Interactive Patient Education  [employer].

## 2017-12-15 ENCOUNTER — Encounter

## 2017-12-20 ENCOUNTER — Ambulatory Visit (INDEPENDENT_AMBULATORY_CARE_PROVIDER_SITE_OTHER): Payer: Medicare HMO | Admitting: Family Medicine

## 2017-12-20 ENCOUNTER — Encounter: Payer: Self-pay | Admitting: Family Medicine

## 2017-12-20 VITALS — BP 157/92 | HR 79 | Ht 62.0 in | Wt 221.0 lb

## 2017-12-20 DIAGNOSIS — M79672 Pain in left foot: Secondary | ICD-10-CM | POA: Diagnosis not present

## 2017-12-20 DIAGNOSIS — M25532 Pain in left wrist: Secondary | ICD-10-CM

## 2017-12-20 DIAGNOSIS — M79671 Pain in right foot: Secondary | ICD-10-CM

## 2017-12-20 DIAGNOSIS — M25531 Pain in right wrist: Secondary | ICD-10-CM | POA: Diagnosis not present

## 2017-12-20 DIAGNOSIS — R0789 Other chest pain: Secondary | ICD-10-CM | POA: Diagnosis not present

## 2017-12-20 DIAGNOSIS — R799 Abnormal finding of blood chemistry, unspecified: Secondary | ICD-10-CM

## 2017-12-20 DIAGNOSIS — T22211A Burn of second degree of right forearm, initial encounter: Secondary | ICD-10-CM

## 2017-12-20 DIAGNOSIS — M533 Sacrococcygeal disorders, not elsewhere classified: Secondary | ICD-10-CM

## 2017-12-20 DIAGNOSIS — R5383 Other fatigue: Secondary | ICD-10-CM

## 2017-12-20 DIAGNOSIS — I10 Essential (primary) hypertension: Secondary | ICD-10-CM | POA: Diagnosis not present
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
## 2017-12-21 ENCOUNTER — Encounter: Payer: Self-pay | Admitting: Family Medicine

## 2017-12-21 ENCOUNTER — Ambulatory Visit: Payer: Medicare HMO | Admitting: Family Medicine

## 2017-12-21 DIAGNOSIS — M25511 Pain in right shoulder: Secondary | ICD-10-CM

## 2017-12-21 NOTE — Patient Instructions (Signed)
You have rotator cuff impingement still based on your exam though you have a history of labral tear with paralabral cysts. Try to avoid painful activities (overhead activities, lifting with extended arm) as much as possible. Tylenol 500mg  1-2 tabs three times a day as needed for pain. Consider taking the meloxicam. Salon pas patches may help you with the pain. Continue home exercises with yellow theraband. We will go ahead with the MRI arthrogram.

## 2017-12-23 ENCOUNTER — Encounter: Payer: Self-pay | Admitting: Family Medicine
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
## 2017-12-23 NOTE — Assessment & Plan Note (Signed)
Exam consistent with rotator cuff impingement.  She has history of labral tear however and is not improving with conservative treatment.  Will go ahead with MRI arthrogram to further assess.  Tylenol, consider meloxicam and salon pas.  Continue HEP.

## 2017-12-25 ENCOUNTER — Emergency Department (HOSPITAL_BASED_OUTPATIENT_CLINIC_OR_DEPARTMENT_OTHER)
Admission: EM | Admit: 2017-12-25 | Discharge: 2017-12-25 | Disposition: A | Discharge status: Home / Self Care | Payer: Medicare HMO | Attending: Emergency Medicine | Admitting: Emergency Medicine

## 2017-12-25 ENCOUNTER — Encounter (HOSPITAL_BASED_OUTPATIENT_CLINIC_OR_DEPARTMENT_OTHER): Payer: Self-pay

## 2017-12-25 ENCOUNTER — Other Ambulatory Visit: Payer: Self-pay

## 2017-12-25 DIAGNOSIS — J45909 Unspecified asthma, uncomplicated: Secondary | ICD-10-CM | POA: Insufficient documentation

## 2017-12-25 DIAGNOSIS — H532 Diplopia: Secondary | ICD-10-CM | POA: Insufficient documentation

## 2017-12-25 DIAGNOSIS — G43009 Migraine without aura, not intractable, without status migrainosus: Secondary | ICD-10-CM | POA: Diagnosis not present

## 2017-12-25 DIAGNOSIS — H52223 Regular astigmatism, bilateral: Secondary | ICD-10-CM | POA: Diagnosis not present

## 2017-12-25 DIAGNOSIS — M24571 Contracture, right ankle: Secondary | ICD-10-CM | POA: Insufficient documentation

## 2017-12-25 DIAGNOSIS — Z79899 Other long term (current) drug therapy: Secondary | ICD-10-CM | POA: Insufficient documentation

## 2017-12-25 DIAGNOSIS — Z87891 Personal history of nicotine dependence: Secondary | ICD-10-CM | POA: Diagnosis not present

## 2017-12-25 DIAGNOSIS — Z86011 Personal history of benign neoplasm of the brain: Secondary | ICD-10-CM | POA: Insufficient documentation

## 2017-12-25 DIAGNOSIS — I10 Essential (primary) hypertension: Secondary | ICD-10-CM | POA: Diagnosis not present

## 2017-12-25 DIAGNOSIS — Z8544 Personal history of malignant neoplasm of other female genital organs: Secondary | ICD-10-CM | POA: Diagnosis not present

## 2017-12-25 DIAGNOSIS — M722 Plantar fascial fibromatosis: Secondary | ICD-10-CM | POA: Diagnosis not present

## 2017-12-25 DIAGNOSIS — M24572 Contracture, left ankle: Secondary | ICD-10-CM | POA: Diagnosis not present

## 2017-12-25 DIAGNOSIS — H40013 Open angle with borderline findings, low risk, bilateral: Secondary | ICD-10-CM | POA: Diagnosis not present

## 2017-12-25 DIAGNOSIS — Z8542 Personal history of malignant neoplasm of other parts of uterus: Secondary | ICD-10-CM | POA: Insufficient documentation

## 2017-12-25 DIAGNOSIS — H5711 Ocular pain, right eye: Secondary | ICD-10-CM | POA: Insufficient documentation

## 2017-12-25 DIAGNOSIS — H5203 Hypermetropia, bilateral: Secondary | ICD-10-CM | POA: Diagnosis not present

## 2017-12-25 MED ORDER — FLUORESCEIN SODIUM 1 MG OP STRP
1.0000 | ORAL_STRIP | Freq: Once | OPHTHALMIC | Status: AC
  Administered 2017-12-25: 1 via OPHTHALMIC
  Filled 2017-12-25: qty 1

## 2017-12-25 MED ORDER — TETRACAINE HCL 0.5 % OP SOLN
1.0000 [drp] | Freq: Once | OPHTHALMIC | Status: AC
Start: 2017-12-25 — End: 2017-12-25
  Administered 2017-12-25: 1 [drp] via OPHTHALMIC
  Filled 2017-12-25: qty 4

## 2017-12-25 NOTE — ED Triage Notes (Signed)
C/o pain to right eye x 3 days-denies injury-states she "felt faint" today-NAD-steady gait

## 2017-12-25 NOTE — Discharge Instructions (Signed)
You can take Tylenol or Ibuprofen as directed for pain. You can alternate Tylenol and Ibuprofen every 4 hours. If you take Tylenol at 1pm, then you can take Ibuprofen at 5pm. Then you can take Tylenol again at 9pm.   As we discussed, follow-up with referred ophthalmology for further evaluation.  Return to the emergency department for any worsening pain, vision loss, redness or swelling around the eye, fevers or any other worsening or concerning symptoms.
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
## 2017-12-26 ENCOUNTER — Other Ambulatory Visit: Payer: Self-pay

## 2017-12-26 ENCOUNTER — Ambulatory Visit: Payer: Medicare HMO | Attending: Family Medicine | Admitting: Physical Therapy

## 2017-12-26 DIAGNOSIS — M545 Low back pain, unspecified: Secondary | ICD-10-CM

## 2017-12-26 DIAGNOSIS — R2689 Other abnormalities of gait and mobility: Secondary | ICD-10-CM | POA: Diagnosis not present

## 2017-12-26 DIAGNOSIS — M533 Sacrococcygeal disorders, not elsewhere classified: Secondary | ICD-10-CM

## 2017-12-26 DIAGNOSIS — E876 Hypokalemia: Secondary | ICD-10-CM | POA: Diagnosis not present

## 2017-12-26 DIAGNOSIS — M6281 Muscle weakness (generalized): Secondary | ICD-10-CM | POA: Diagnosis not present

## 2017-12-26 NOTE — Telephone Encounter (Signed)
Patient would like repeat Vit B lab order sent to [facility], please.

## 2017-12-26 NOTE — Patient Instructions (Addendum)
Sleeping on Back  Place pillow under knees. A pillow with cervical support and a roll around waist are also helpful. Copyright  VHI. All rights reserved.  Sleeping on Side Place pillow between knees. Use cervical support under neck and a roll around waist as needed. Copyright  VHI. All rights reserved.   Sleeping on Stomach   If this is the only desirable sleeping position, place pillow under lower legs, and under stomach or chest as needed.  Posture - Sitting   Sit upright, head facing forward. Try using a roll to support lower back. Keep shoulders relaxed, and avoid rounded back. Keep hips level with knees. Avoid crossing legs for long periods. Stand to Sit / Sit to Stand   To sit: Bend knees to lower self onto front edge of chair, then scoot back on seat. To stand: Reverse sequence by placing one foot forward, and scoot to front of seat. Use rocking motion to stand up.   Work Height and Reach  Ideal work height is no more than 2 to 4 inches below elbow level when standing, and at elbow level when sitting. Reaching should be limited to arm's length, with elbows slightly bent.  Bending  Bend at hips and knees, not back. Keep feet shoulder-width apart.    Posture - Standing   Good posture is important. Avoid slouching and forward head thrust. Maintain curve in low back and align ears over shoul- ders, hips over ankles.  Alternating Positions   Alternate tasks and change positions frequently to reduce fatigue and muscle tension. Take rest breaks. Computer Work   Position work to face forward. Use proper work and seat height. Keep shoulders back and down, wrists straight, and elbows at right angles. Use chair that provides full back support. Add footrest and lumbar roll as needed.  Getting Into / Out of Car  Lower self onto seat, scoot back, then bring in one leg at a time. Reverse sequence to get out.  Dressing  Lie on back to pull socks or slacks over feet, or sit  and bend leg while keeping back straight.    Housework - Sink  Place one foot on ledge of cabinet under sink when standing at sink for prolonged periods.   Pushing / Pulling  Pushing is preferable to pulling. Keep back in proper alignment, and use leg muscles to do the work.  Deep Squat   Squat and lift with both arms held against upper trunk. Tighten stomach muscles without holding breath. Use smooth movements to avoid jerking.  Avoid Twisting   Avoid twisting or bending back. Pivot around using foot movements, and bend at knees if needed when reaching for articles.  Carrying Luggage   Distribute weight evenly on both sides. Use a cart whenever possible. Do not twist trunk. Move body as a unit.   Lifting Principles .Maintain proper posture and head alignment. .Slide object as close as possible before lifting. .Move obstacles out of the way. .Test before lifting; ask for help if too heavy. .Tighten stomach muscles without holding breath. .Use smooth movements; do not jerk. .Use legs to do the work, and pivot with feet. .Distribute the work load symmetrically and close to the center of trunk. .Push instead of pull whenever possible.   Ask For Help   Ask for help and delegate to others when possible. Coordinate your movements when lifting together, and maintain the low back curve.  Log Roll   Lying on back, bend left knee and place left   arm across chest. Roll all in one movement to the right. Reverse to roll to the left. Always move as one unit. Housework - Sweeping  Use long-handled equipment to avoid stooping.   Housework - Wiping  Position yourself as close as possible to reach work surface. Avoid straining your back.  Laundry - Unloading Wash   To unload small items at bottom of washer, lift leg opposite to arm being used to reach.  Gardening - Raking  Move close to area to be raked. Use arm movements to do the work. Keep back straight and avoid  twisting.     Cart  When reaching into cart with one arm, lift opposite leg to keep back straight.   Getting Into / Out of Bed  Lower self to lie down on one side by raising legs and lowering head at the same time. Use arms to assist moving without twisting. Bend both knees to roll onto back if desired. To sit up, start from lying on side, and use same move-ments in reverse. Housework - Vacuuming  Hold the vacuum with arm held at side. Step back and forth to move it, keeping head up. Avoid twisting.   Laundry - Loading Wash  Position laundry basket so that bending and twisting can be avoided.   Laundry - Unloading Dryer  Squat down to reach into clothes dryer or use a reacher.  Gardening - Weeding / Planting  Squat or Kneel. Knee pads may be helpful.                    
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
## 2018-01-01 DIAGNOSIS — Z882 Allergy status to sulfonamides status: Secondary | ICD-10-CM | POA: Diagnosis not present

## 2018-01-01 DIAGNOSIS — Z880 Allergy status to penicillin: Secondary | ICD-10-CM | POA: Diagnosis not present

## 2018-01-01 DIAGNOSIS — E039 Hypothyroidism, unspecified: Secondary | ICD-10-CM | POA: Diagnosis not present

## 2018-01-01 DIAGNOSIS — Z888 Allergy status to other drugs, medicaments and biological substances status: Secondary | ICD-10-CM | POA: Diagnosis not present

## 2018-01-01 DIAGNOSIS — Z885 Allergy status to narcotic agent status: Secondary | ICD-10-CM | POA: Diagnosis not present

## 2018-01-01 DIAGNOSIS — D320 Benign neoplasm of cerebral meninges: Secondary | ICD-10-CM | POA: Diagnosis not present

## 2018-01-01 DIAGNOSIS — Z886 Allergy status to analgesic agent status: Secondary | ICD-10-CM | POA: Diagnosis not present

## 2018-01-01 DIAGNOSIS — D329 Benign neoplasm of meninges, unspecified: Secondary | ICD-10-CM | POA: Diagnosis not present

## 2018-01-01 DIAGNOSIS — Z881 Allergy status to other antibiotic agents status: Secondary | ICD-10-CM | POA: Diagnosis not present

## 2018-01-01 DIAGNOSIS — R51 Headache: Secondary | ICD-10-CM | POA: Diagnosis not present

## 2018-01-01 DIAGNOSIS — Z884 Allergy status to anesthetic agent status: Secondary | ICD-10-CM | POA: Diagnosis not present

## 2018-01-02 ENCOUNTER — Encounter: Payer: Self-pay | Admitting: Family Medicine

## 2018-01-03 ENCOUNTER — Encounter (HOSPITAL_BASED_OUTPATIENT_CLINIC_OR_DEPARTMENT_OTHER): Payer: Self-pay | Admitting: *Deleted

## 2018-01-03 ENCOUNTER — Ambulatory Visit: Payer: Self-pay | Admitting: Family Medicine

## 2018-01-03 ENCOUNTER — Emergency Department (HOSPITAL_BASED_OUTPATIENT_CLINIC_OR_DEPARTMENT_OTHER)
Admission: EM | Admit: 2018-01-03 | Discharge: 2018-01-03 | Disposition: A | Discharge status: Home / Self Care | Payer: Medicare HMO | Attending: Emergency Medicine | Admitting: Emergency Medicine

## 2018-01-03 ENCOUNTER — Other Ambulatory Visit: Payer: Self-pay

## 2018-01-03 DIAGNOSIS — Z8544 Personal history of malignant neoplasm of other female genital organs: Secondary | ICD-10-CM | POA: Diagnosis not present

## 2018-01-03 DIAGNOSIS — Z87891 Personal history of nicotine dependence: Secondary | ICD-10-CM | POA: Diagnosis not present

## 2018-01-03 DIAGNOSIS — R0789 Other chest pain: Secondary | ICD-10-CM | POA: Insufficient documentation

## 2018-01-03 DIAGNOSIS — Z79899 Other long term (current) drug therapy: Secondary | ICD-10-CM | POA: Diagnosis not present

## 2018-01-03 DIAGNOSIS — J45909 Unspecified asthma, uncomplicated: Secondary | ICD-10-CM | POA: Insufficient documentation

## 2018-01-03 DIAGNOSIS — I10 Essential (primary) hypertension: Secondary | ICD-10-CM | POA: Insufficient documentation

## 2018-01-03 DIAGNOSIS — R079 Chest pain, unspecified: Secondary | ICD-10-CM

## 2018-01-03 LAB — TROPONIN I: Troponin I: <0.03 ng/mL (ref <0.03)

## 2018-01-03 NOTE — ED Notes (Signed)
Pt cont await md eval.

## 2018-01-03 NOTE — ED Triage Notes (Signed)
Pt reports discomfort off and on in her chest with some intermittent sweating since working out at the gym yesterday. Denies any sob, dizzyness, nausea or other c/o.
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
## 2018-01-04 ENCOUNTER — Ambulatory Visit: Payer: Medicare HMO

## 2018-01-04 ENCOUNTER — Ambulatory Visit (INDEPENDENT_AMBULATORY_CARE_PROVIDER_SITE_OTHER): Payer: Medicare HMO | Admitting: Family Medicine

## 2018-01-04 ENCOUNTER — Other Ambulatory Visit: Payer: Self-pay | Admitting: Family Medicine

## 2018-01-04 ENCOUNTER — Telehealth: Payer: Self-pay | Admitting: Family Medicine

## 2018-01-04 ENCOUNTER — Encounter: Payer: Self-pay | Admitting: Family Medicine

## 2018-01-04 ENCOUNTER — Ambulatory Visit (INDEPENDENT_AMBULATORY_CARE_PROVIDER_SITE_OTHER): Payer: Medicare HMO

## 2018-01-04 VITALS — BP 136/78 | HR 87 | Ht 62.0 in | Wt 221.0 lb

## 2018-01-04 VITALS — BP 140/78 | HR 87 | Ht 62.0 in | Wt 221.0 lb

## 2018-01-04 DIAGNOSIS — M19042 Primary osteoarthritis, left hand: Secondary | ICD-10-CM | POA: Diagnosis not present

## 2018-01-04 DIAGNOSIS — R7303 Prediabetes: Secondary | ICD-10-CM | POA: Diagnosis not present

## 2018-01-04 DIAGNOSIS — M25532 Pain in left wrist: Secondary | ICD-10-CM

## 2018-01-04 DIAGNOSIS — K5909 Other constipation: Secondary | ICD-10-CM

## 2018-01-04 DIAGNOSIS — E531 Pyridoxine deficiency: Secondary | ICD-10-CM | POA: Diagnosis not present

## 2018-01-04 DIAGNOSIS — R0789 Other chest pain: Secondary | ICD-10-CM | POA: Diagnosis not present

## 2018-01-04 DIAGNOSIS — M79642 Pain in left hand: Secondary | ICD-10-CM | POA: Diagnosis not present

## 2018-01-04 DIAGNOSIS — Z13220 Encounter for screening for lipoid disorders: Secondary | ICD-10-CM

## 2018-01-04 DIAGNOSIS — M533 Sacrococcygeal disorders, not elsewhere classified: Secondary | ICD-10-CM | POA: Diagnosis not present

## 2018-01-04 DIAGNOSIS — I10 Essential (primary) hypertension: Secondary | ICD-10-CM | POA: Diagnosis not present

## 2018-01-04 LAB — POCT GLYCOSYLATED HEMOGLOBIN (HGB A1C): Hemoglobin A1C: 6.2 % — AB (ref 4.0–5.6)

## 2018-01-04 MED ORDER — DICLOFENAC SODIUM 1 % TD GEL: 2.0000 g | Freq: Four times a day (QID) | TRANSDERMAL | 11 refills | Status: DC

## 2018-01-04 MED ORDER — LACTULOSE 10 GM/15ML PO SOLN: 20.0000 g | Freq: Three times a day (TID) | ORAL | 3 refills | Status: DC | PRN

## 2018-01-04 NOTE — Progress Notes (Signed)
Subjective:    CC: F/U HTN  HPI:  Hypertension- Pt denies chest pain, SOB, dizziness, or heart palpitations.  Taking meds as directed w/o problems.  Denies medication side effects.    She has  actually been to the emergency department twice since I last saw her 2 weeks ago.  The last one being yesterday where she went for chest pain.  The day before she been exercising on the elliptical type machine and felt like she was sweating excessively for her norm and then started to explain some left-sided chest pain underneath the breast area.  She went home and had some palpitations and racing took her metoprolol in the next morning was still concerned so went into be evaluated.  She had an unchanged EKG and normal troponins and was discharged home.  He is actually been trying to exercise.  In fact she went to the gym on Monday Tuesday and Wednesday.  She mostly did some strengthening on the weight machines and then did about 10 minutes on the treadmill and 10 minutes on the elliptical.  Impaired fasting glucose-no increased thirst or urination. No symptoms consistent with hypoglycemia.  Also concerned that she is getting migraine headaches again.  She said she had them years ago to the point where she would have to just sit in a dark quiet room and even had to go the emergency department a few times.  She is actually done really well and has not had headaches of that severity in years but more recently she is been having particularly right-sided eye pain.  She actually went back to see her eye doctor and they checked everything out everything looked great.  He felt like she could actually be having ocular migraines and gave her beta-blocker eyedrops though he warned her they may or may not help since she is Artie on an oral beta-blocker.  But he did encourage her to try it.  She says she feels like it really has not helped very much they encouraged her to follow-up with her PCP.  Also been struggling with  foot pain.  She was diagnosed with possible plantar fasciitis as well as tinea pedis.  That she is not really had any itching at all but they did recommend Lamisil cream and she does plan to use it and try it for several weeks.  Like referral to podiatry.  Past medical history, Surgical history, Family history not pertinant except as noted below, Social history, Allergies, and medications have been entered into the medical record, reviewed, and corrections made.   Review of Systems: No fevers, chills, night sweats, weight loss, chest pain, or shortness of breath.   Objective:    General: Well Developed, well nourished, and in no acute distress.  Neuro: Alert and oriented x3, extra-ocular muscles intact, sensation grossly intact.  HEENT: Normocephalic, atraumatic  Skin: Warm and dry, no rashes. Cardiac: Regular rate and rhythm, no murmurs rubs or gallops, no lower extremity edema.  Respiratory: Clear to auscultation bilaterally. Not using accessory muscles, speaking in full sentences.   Impression and Recommendations:    HTN - Well controlled. Continue current regimen. Follow up in  3 months.    Atypical chest pain- Resolved.  I did congratulate her on her attempts to start exercising.  Just make sure that she does not moderation and does not push herself too much considering she is been extremely sedentary over the last couple of months.  IFG -hemoglobin A1c still elevated at 6.2.  She will  actually be a great candidate for metformin.  I think it would lower her A1c in addition to help curb her appetite and might even help improve some of her chronic constipation issues.  Constipation, chronic -refilled lactulose.  Urged her to use it daily instead of just as needed.  Migraine HA -we discussed options.  Unclear why she has more recently started having migraines but we could certainly consider Topamax which may help with weight loss as well as with her migraine headaches.  She will think  about it and let me know.  Foot pain-we placed referral to podiatry a couple of weeks ago but have not heard back.  We will check on the referral and make sure that they have attempted to contact the patient.  If she does have plantar fasciitis we discussed that the main treatments really are stretches and icing and good shoe wear in addition to possibly night splints.  Time spent 45 minutes, greater than 50% of time spent counseling about her hypertension, chest pain, prediabetes, constipation, migraine headaches, and foot pain

## 2018-01-04 NOTE — Progress Notes (Signed)
Subjective:     CC:  Follow-up wrist pain and coccydynia  HPI:  Jennifer [name] fell about a month ago.  She was seen on May 3 for coccyx pain and right wrist pain with minimal left wrist pain.  X-rays of her coccyx and right wrist were unremarkable.  She was treated with thumb spica splint for her right wrist as well as physical therapy for her buttocks pain.  Buttocks pain: Patient has a physical therapy has helped a great deal.  However recently the pain is slowly creeping back.  She denies any radiating pain weakness or numbness fevers or chills.  Notes that she has a follow-up appointment scheduled with physical therapy.  Pain: Patient notes that her right wrist pain has resolved however her left thumb and wrist is are now bothering her. She notes pain at the base of the thumb at the thenar eminence and at the first MCP.  She has minimal pain located in the anatomical snuffbox.  She has pain is worse with activity.  She has a thumb spica splint at home but has not been using it.  She denies any repeat injury.  She notes pain with thumb opposition.  No fevers or chills.  Problem list and medications reviewed  Review of Systems: See HPI  Objective:    Vitals:   01/04/18 1022  BP: 140/78  Pulse: 87   General: Well Developed, well nourished, and in no acute distress.  Neuro/Psych: Alert and oriented x3, extra-ocular muscles intact, able to move all 4 extremities, sensation grossly intact. Skin: Warm and dry, no rashes noted.  Respiratory: Not using accessory muscles, speaking in full sentences, trachea midline.  Cardiovascular: Pulses palpable, no extremity edema. Abdomen: Does not appear distended. MSK:  Buttocks: Mildly tender.  Pain with ambulation.  Lower extreme strength is intact.  Right wrist normal-appearing not particularly tender.  Normal motion.  Left wrist: Well-appearing without effusion or erythema or ecchymosis. Normal wrist motion.  Mildly tender to palpation  anatomical snuffbox.  Left hand: Minimal swelling at first MCP.  Tender to palpation at the dorsal and palmar aspect of the MCP.  Not tender at the ulnar or radial sides. Pain with thumb flexion and opposition.  Flexion and MCP is limited by about 10 degrees.  Opposition is limited to the fourth MCP compared to fifth MCP on the contralateral right hand No triggering present with flexion of interphalangeal joint. Stable ligamentous exam to ulnar and radial collateral ligament testing. Capillary refill sensation and pulses are intact.  X-ray left hand and wrist images my personal independent interpretation show no fracture at the carpals bones including scaphoid bone.  No fracture in the hand with special focus at MCP. Formal radiology review  Impression and Recommendations:    Assessment and Plan: 52 y.o. female with   Coccydynia: Plan to continue physical therapy and recheck in 2 weeks.  Recommend padding at home and activity as tolerated.  Left hand and wrist pain: As result of fall.  No obvious fracture seen.  Plan for thumb spica splint for symptom control.  Additionally lymphatic gel prescribed.  Recommend Aspercreme as well if not able to get diclofenac gel.  Recheck in 2 weeks.  If not better will proceed with  hand therapy versus advanced imaging.   Orders Placed This Encounter  Procedures  . DG Wrist Navic Only Left    Standing Status:   Future    Number of Occurrences:   1    Standing Expiration  Date:   03/07/2019    Order Specific Question:   Reason for Exam (SYMPTOM  OR DIAGNOSIS REQUIRED)    Answer:   eval ? scaphoid fx. Will get with hand xray    Order Specific Question:   Is patient pregnant?    Answer:   No    Order Specific Question:   Preferred imaging location?    Answer:   [facility]    Order Specific Question:   Radiology Contrast Protocol - do NOT remove file path    Answer:   \\[email or web address]  . DG Hand  Complete Left    Standing Status:   Future    Number of Occurrences:   1    Standing Expiration Date:   03/07/2019    Order Specific Question:   Reason for Exam (SYMPTOM  OR DIAGNOSIS REQUIRED)    Answer:   eval pain base of thumb after fall    Order Specific Question:   Is patient pregnant?    Answer:   No    Order Specific Question:   Preferred imaging location?    Answer:   [facility]    Order Specific Question:   Radiology Contrast Protocol - do NOT remove file path    Answer:   \\[email or web address]   Meds ordered this encounter  Medications  . diclofenac sodium (VOLTAREN) 1 % GEL    Sig: Apply 2 g topically 4 (four) times daily. To affected joint.    Dispense:  100 g    Refill:  11    Patient cannot tolerate high dose oral NSAIDs    Discussed warning signs or symptoms. Please see discharge instructions. Patient expresses understanding.

## 2018-01-04 NOTE — Telephone Encounter (Signed)
Pt advised. She will think on this and discuss at next OV with PCP.

## 2018-01-04 NOTE — Telephone Encounter (Signed)
Please call pt:  hemoglobin A1c still elevated at 6.2.  She will actually be a great candidate for metformin.  I think it would lower her A1c in addition to help curb her appetite and might even help improve some of her chronic constipation issues

## 2018-01-04 NOTE — Patient Instructions (Addendum)
Thank you for coming in today. Resume the brace for the left wrist and thumb.  Use the diclofenc gel or aspercream.  Recheck in 2 weeks.  Return sooner if needed.

## 2018-01-08 ENCOUNTER — Telehealth: Payer: Self-pay | Admitting: *Deleted

## 2018-01-08 NOTE — Telephone Encounter (Signed)
Attempted to return the patient's call, left a message to call the office back

## 2018-01-09 ENCOUNTER — Ambulatory Visit: Payer: Medicare HMO | Admitting: Physical Therapy

## 2018-01-11 ENCOUNTER — Telehealth: Payer: Self-pay | Admitting: *Deleted

## 2018-01-11 DIAGNOSIS — S2241XA Multiple fractures of ribs, right side, initial encounter for closed fracture: Secondary | ICD-10-CM | POA: Diagnosis not present

## 2018-01-11 DIAGNOSIS — R5383 Other fatigue: Secondary | ICD-10-CM | POA: Diagnosis not present

## 2018-01-11 DIAGNOSIS — M79622 Pain in left upper arm: Secondary | ICD-10-CM | POA: Diagnosis not present

## 2018-01-11 DIAGNOSIS — M791 Myalgia, unspecified site: Secondary | ICD-10-CM | POA: Diagnosis not present

## 2018-01-11 NOTE — Telephone Encounter (Signed)
Patient called this week and requested the offices help to find a regular gyn office that deals with paget's disease. Several offices in both in [place] called over several days to make the referral, no office found to help the patient with pagets disease. Called the patient explained the no offices found and to either come to oour ofice for her last visit, or try to go back to [facility]. Patient stated that "I will call back in a couple of week to make an appt and decision."

## 2018-01-12 IMAGING — DX DG RIBS W/ CHEST 3+V*L*
3 series · 3 of 3 positions shown · non-contrast
Comparison: 09/11/2016 chest x-ray

CLINICAL DATA: Left chest wall injury and pain.  Initial encounter.

EXAM:
LEFT RIBS AND CHEST - 3+ VIEW

[chest pa]
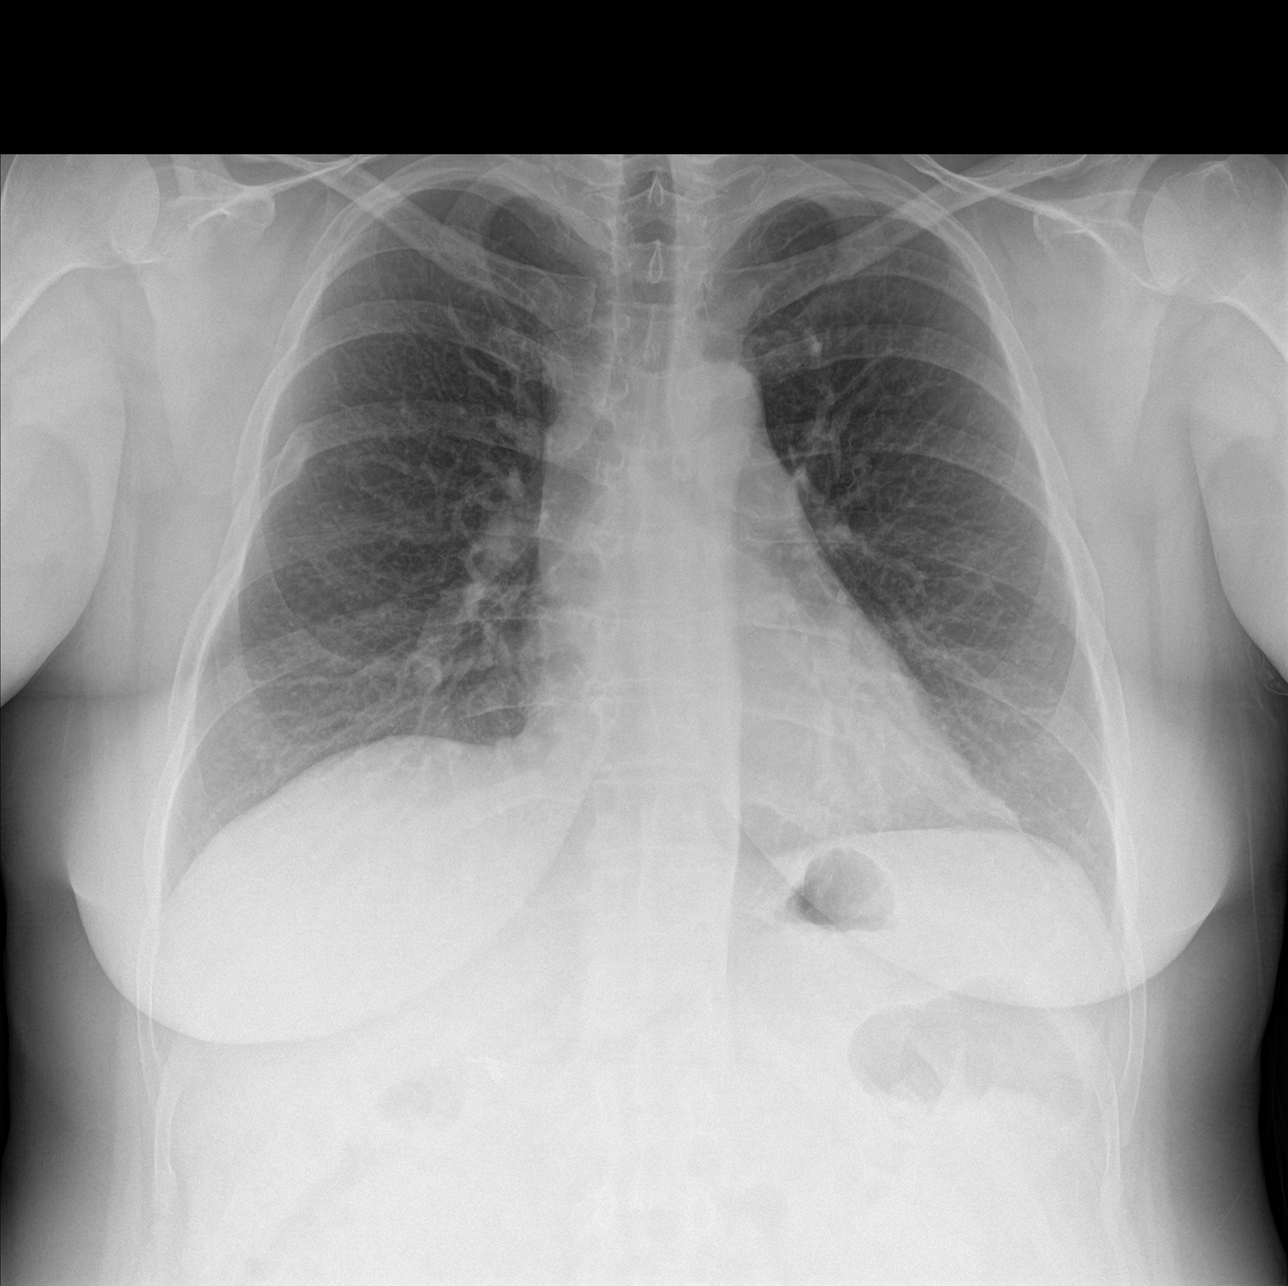

[rib pa]
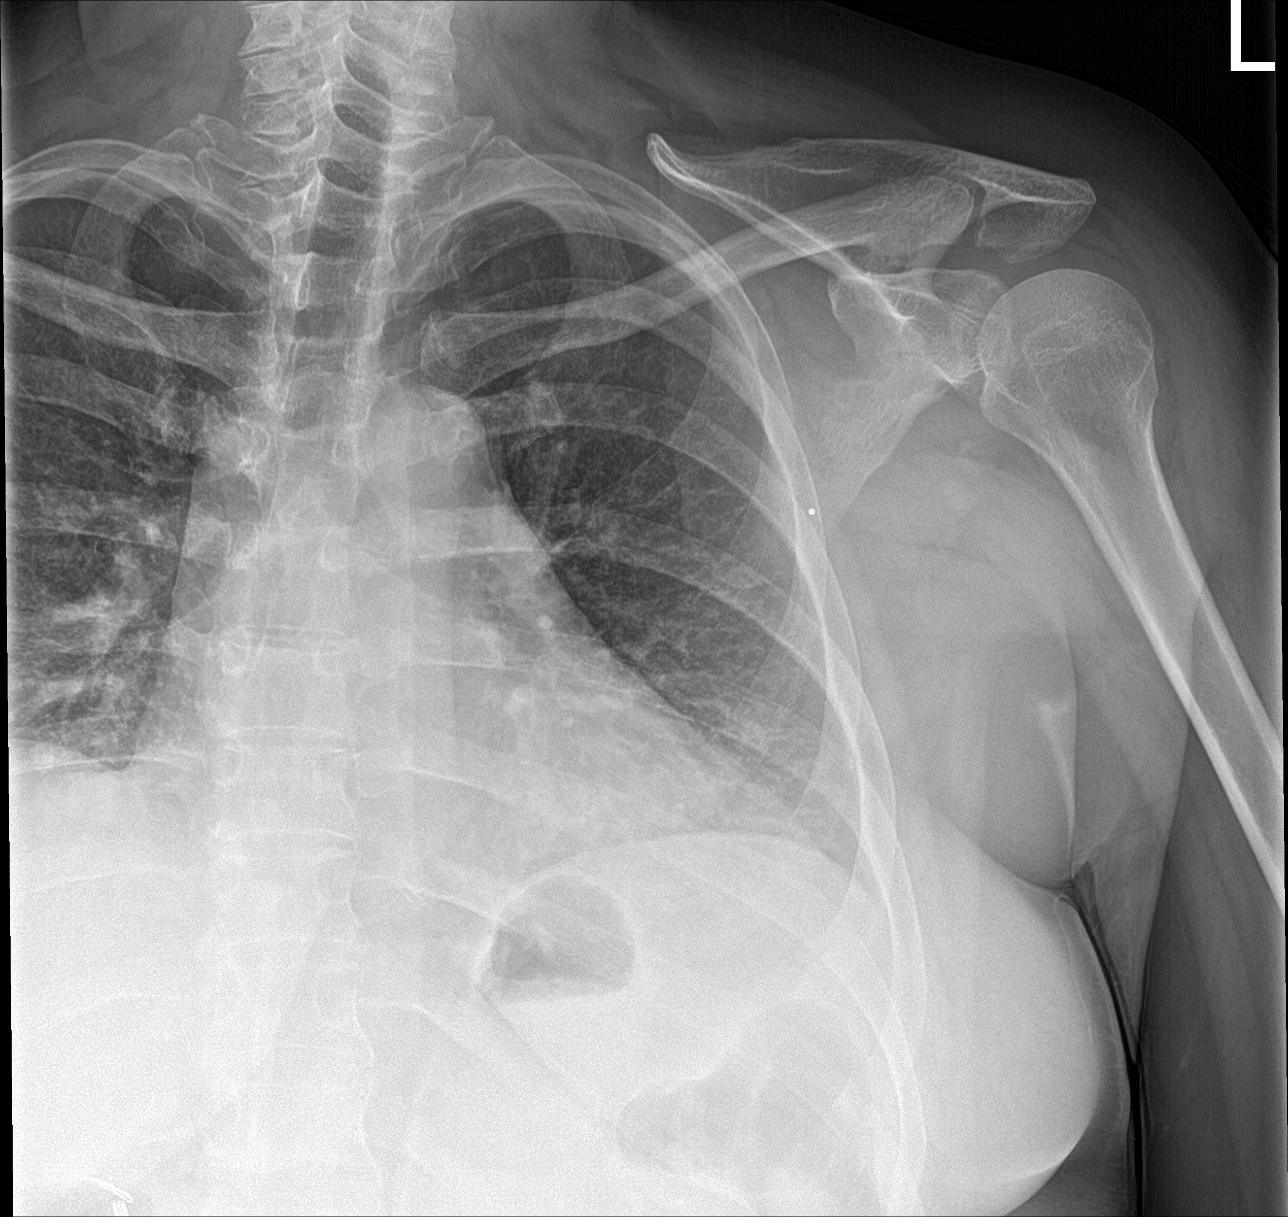

[rib pa obl]
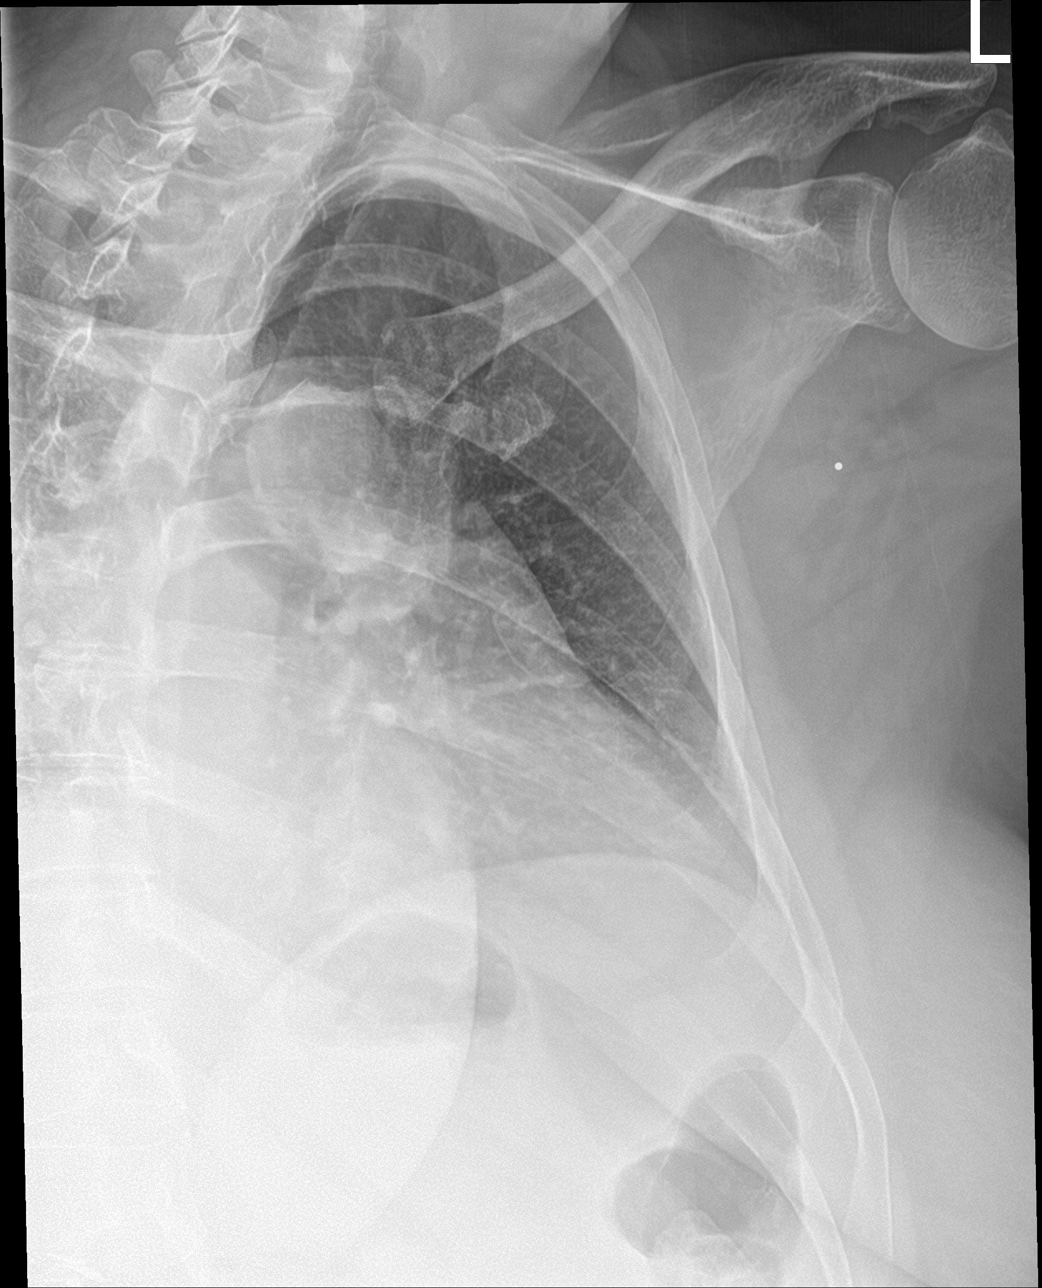

[3 of 3 positions shown; findings below may reference images not displayed]

FINDINGS: No fracture or other bone lesions are seen involving the left ribs.
There is a remote, healed right sixth rib fracture. There is no
evidence of pneumothorax or pleural effusion. Both lungs are clear.
Heart size and mediastinal contours are within normal limits.
IMPRESSION: Negative left ribs.  No evidence of intrathoracic injury.

## 2018-01-14 DIAGNOSIS — R9431 Abnormal electrocardiogram [ECG] [EKG]: Secondary | ICD-10-CM | POA: Diagnosis not present

## 2018-01-15 ENCOUNTER — Telehealth: Payer: Self-pay

## 2018-01-15 ENCOUNTER — Encounter (INDEPENDENT_AMBULATORY_CARE_PROVIDER_SITE_OTHER): Payer: Self-pay

## 2018-01-15 DIAGNOSIS — Z87891 Personal history of nicotine dependence: Secondary | ICD-10-CM | POA: Diagnosis not present

## 2018-01-15 DIAGNOSIS — R9431 Abnormal electrocardiogram [ECG] [EKG]: Secondary | ICD-10-CM | POA: Diagnosis not present

## 2018-01-15 DIAGNOSIS — K120 Recurrent oral aphthae: Secondary | ICD-10-CM | POA: Diagnosis not present

## 2018-01-15 DIAGNOSIS — R220 Localized swelling, mass and lump, head: Secondary | ICD-10-CM | POA: Diagnosis not present

## 2018-01-15 NOTE — Telephone Encounter (Signed)
Pt called wanting to know if she could see Dr Madilyn Fireman this AM. Pt states that she had a spot on her tongue that appeared last night and is starting to swell. Pt reports that she fears she may be having an allergic reaction and that the spot is bothering her. She denied any trouble breathing "yet".  I advised pt that if she thinks she is having an allergic reaction and if she thinks that she may start to develop breathing difficulty, she needs to have someone take her to ER to be evaluated.   Pt declines having someone take her, stating "I can drive myself", but she did agree to go to ER for evaluation.

## 2018-01-15 NOTE — Telephone Encounter (Signed)
Lowella called and states she has lost a close friend and she would like a short prescription for her anxiety. She states she doesn't want valium because it is to strong.

## 2018-01-16 ENCOUNTER — Encounter: Payer: Self-pay | Admitting: Family Medicine

## 2018-01-16 ENCOUNTER — Ambulatory Visit: Payer: Self-pay | Admitting: Family Medicine

## 2018-01-16 ENCOUNTER — Ambulatory Visit (INDEPENDENT_AMBULATORY_CARE_PROVIDER_SITE_OTHER): Payer: Medicare HMO | Admitting: Family Medicine

## 2018-01-16 VITALS — BP 129/69 | HR 86 | Ht 62.0 in | Wt 220.0 lb

## 2018-01-16 DIAGNOSIS — F4310 Post-traumatic stress disorder, unspecified: Secondary | ICD-10-CM

## 2018-01-16 DIAGNOSIS — Z6841 Body Mass Index (BMI) 40.0 and over, adult: Secondary | ICD-10-CM | POA: Diagnosis not present

## 2018-01-16 DIAGNOSIS — R635 Abnormal weight gain: Secondary | ICD-10-CM | POA: Diagnosis not present

## 2018-01-16 DIAGNOSIS — R7303 Prediabetes: Secondary | ICD-10-CM | POA: Diagnosis not present

## 2018-01-16 DIAGNOSIS — R69 Illness, unspecified: Secondary | ICD-10-CM | POA: Diagnosis not present

## 2018-01-16 NOTE — Telephone Encounter (Signed)
Patient seen in the office today and wants to hold off on any medication.
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
## 2018-01-17 ENCOUNTER — Ambulatory Visit: Payer: Medicare HMO | Admitting: Physical Therapy

## 2018-01-17 DIAGNOSIS — R1310 Dysphagia, unspecified: Secondary | ICD-10-CM | POA: Diagnosis not present

## 2018-01-17 DIAGNOSIS — R220 Localized swelling, mass and lump, head: Secondary | ICD-10-CM | POA: Diagnosis not present

## 2018-01-17 DIAGNOSIS — K117 Disturbances of salivary secretion: Secondary | ICD-10-CM | POA: Diagnosis not present

## 2018-01-17 DIAGNOSIS — R1110 Vomiting, unspecified: Secondary | ICD-10-CM | POA: Diagnosis not present

## 2018-01-24 ENCOUNTER — Telehealth: Payer: Self-pay | Admitting: Family Medicine

## 2018-01-24 DIAGNOSIS — Z13220 Encounter for screening for lipoid disorders: Secondary | ICD-10-CM | POA: Diagnosis not present

## 2018-01-24 DIAGNOSIS — R7303 Prediabetes: Secondary | ICD-10-CM | POA: Diagnosis not present

## 2018-01-24 DIAGNOSIS — E876 Hypokalemia: Secondary | ICD-10-CM | POA: Diagnosis not present

## 2018-01-24 LAB — LIPID PANEL
Cholesterol: 170 (ref 0–200)
HDL: 38 (ref 35–70)
LDL Cholesterol: 92
LDl/HDL Ratio: 4.5
Triglycerides: 200 — AB (ref 40–160)

## 2018-01-24 LAB — BASIC METABOLIC PANEL
BUN: 13 (ref 4–21)
Creatinine: 0.6 (ref 0.5–1.1)
Glucose: 108
Potassium: 4 (ref 3.4–5.3)
Sodium: 140 (ref 137–147)

## 2018-01-24 NOTE — Telephone Encounter (Signed)
Patient called and request that [facility] call her so she can ask a lab question.

## 2018-01-25 ENCOUNTER — Telehealth: Payer: Self-pay | Admitting: Family Medicine

## 2018-01-25 NOTE — Telephone Encounter (Signed)
IN addition to note below please let her know: The [employer] (including the one here in [place]) has a 'diabetes prevention program'. It is listed on the website and anyone with PREDIABETES can sign up. They will file insurance and apparently they get a health coach, exercise and nutrition! This seems like a great option for our patients that don't have coverage for nutrition but desperately need it.

## 2018-01-25 NOTE — Telephone Encounter (Signed)
lvm asking that pt rtn call .Jennifer [name], [place]

## 2018-01-25 NOTE — Telephone Encounter (Signed)
Patient: Metabolic panel overall looks good.  Glucose was just borderline elevated at 108.  Not sure if she was fasting or not.  Total cholesterol and LDL at goal.  Triglycerides just borderline elevated.

## 2018-01-29 ENCOUNTER — Ambulatory Visit: Payer: Medicare HMO | Admitting: Podiatry

## 2018-01-30 ENCOUNTER — Encounter: Payer: Self-pay | Admitting: Family Medicine

## 2018-01-30 ENCOUNTER — Telehealth: Payer: Self-pay | Admitting: Family Medicine

## 2018-01-30 ENCOUNTER — Ambulatory Visit (INDEPENDENT_AMBULATORY_CARE_PROVIDER_SITE_OTHER): Payer: Medicare HMO | Admitting: Family Medicine

## 2018-01-30 VITALS — BP 148/85 | HR 78 | Ht 62.0 in | Wt 222.0 lb

## 2018-01-30 DIAGNOSIS — I10 Essential (primary) hypertension: Secondary | ICD-10-CM | POA: Diagnosis not present

## 2018-01-30 DIAGNOSIS — R7303 Prediabetes: Secondary | ICD-10-CM

## 2018-01-30 DIAGNOSIS — D509 Iron deficiency anemia, unspecified: Secondary | ICD-10-CM | POA: Insufficient documentation

## 2018-01-30 DIAGNOSIS — K6289 Other specified diseases of anus and rectum: Secondary | ICD-10-CM | POA: Diagnosis not present

## 2018-01-30 DIAGNOSIS — D1801 Hemangioma of skin and subcutaneous tissue: Secondary | ICD-10-CM | POA: Diagnosis not present

## 2018-01-30 DIAGNOSIS — R635 Abnormal weight gain: Secondary | ICD-10-CM | POA: Diagnosis not present

## 2018-01-30 DIAGNOSIS — I781 Nevus, non-neoplastic: Secondary | ICD-10-CM

## 2018-01-30 MED ORDER — HYDROCORTISONE 2.5 % RE CREA
1.0000 | TOPICAL_CREAM | Freq: Two times a day (BID) | RECTAL | 2 refills | Status: DC
Start: 2018-01-30 — End: 2018-04-20
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
## 2018-01-30 NOTE — Telephone Encounter (Signed)
Patient called. She wants Tonya to give her a call because she needs help with how to take her vitamins.

## 2018-01-30 NOTE — Telephone Encounter (Signed)
Pt advised of this information .Jennifer [name] [place], [place]'

## 2018-01-30 NOTE — Telephone Encounter (Signed)
Spoke w/pt at [place] today.Jennifer [name] [place]

## 2018-01-31 DIAGNOSIS — X58XXXA Exposure to other specified factors, initial encounter: Secondary | ICD-10-CM | POA: Diagnosis not present

## 2018-01-31 DIAGNOSIS — T452X5A Adverse effect of vitamins, initial encounter: Secondary | ICD-10-CM | POA: Diagnosis not present

## 2018-01-31 DIAGNOSIS — T50995A Adverse effect of other drugs, medicaments and biological substances, initial encounter: Secondary | ICD-10-CM | POA: Diagnosis not present

## 2018-01-31 DIAGNOSIS — R197 Diarrhea, unspecified: Secondary | ICD-10-CM | POA: Diagnosis not present

## 2018-01-31 DIAGNOSIS — R112 Nausea with vomiting, unspecified: Secondary | ICD-10-CM | POA: Diagnosis not present

## 2018-01-31 DIAGNOSIS — Y999 Unspecified external cause status: Secondary | ICD-10-CM | POA: Diagnosis not present

## 2018-01-31 NOTE — Addendum Note (Signed)
Addended by: Sherrie George F on: 01/31/2018 03:25 PM   Modules accepted: Orders

## 2018-02-01 DIAGNOSIS — R079 Chest pain, unspecified: Secondary | ICD-10-CM | POA: Diagnosis not present

## 2018-02-01 DIAGNOSIS — R935 Abnormal findings on diagnostic imaging of other abdominal regions, including retroperitoneum: Secondary | ICD-10-CM | POA: Diagnosis not present

## 2018-02-01 DIAGNOSIS — Z90710 Acquired absence of both cervix and uterus: Secondary | ICD-10-CM | POA: Diagnosis not present

## 2018-02-01 DIAGNOSIS — Z885 Allergy status to narcotic agent status: Secondary | ICD-10-CM | POA: Diagnosis not present

## 2018-02-01 DIAGNOSIS — G35 Multiple sclerosis: Secondary | ICD-10-CM | POA: Diagnosis not present

## 2018-02-01 DIAGNOSIS — Z9049 Acquired absence of other specified parts of digestive tract: Secondary | ICD-10-CM | POA: Diagnosis not present

## 2018-02-01 DIAGNOSIS — Z881 Allergy status to other antibiotic agents status: Secondary | ICD-10-CM | POA: Diagnosis not present

## 2018-02-01 DIAGNOSIS — Z882 Allergy status to sulfonamides status: Secondary | ICD-10-CM | POA: Diagnosis not present

## 2018-02-01 DIAGNOSIS — R0602 Shortness of breath: Secondary | ICD-10-CM | POA: Diagnosis not present

## 2018-02-01 DIAGNOSIS — R1030 Lower abdominal pain, unspecified: Secondary | ICD-10-CM | POA: Diagnosis not present

## 2018-02-01 DIAGNOSIS — Z880 Allergy status to penicillin: Secondary | ICD-10-CM | POA: Diagnosis not present

## 2018-02-01 DIAGNOSIS — Z79899 Other long term (current) drug therapy: Secondary | ICD-10-CM | POA: Diagnosis not present

## 2018-02-01 DIAGNOSIS — I10 Essential (primary) hypertension: Secondary | ICD-10-CM | POA: Diagnosis not present

## 2018-02-01 DIAGNOSIS — R110 Nausea: Secondary | ICD-10-CM | POA: Diagnosis not present

## 2018-02-01 DIAGNOSIS — R109 Unspecified abdominal pain: Secondary | ICD-10-CM | POA: Diagnosis not present

## 2018-02-01 DIAGNOSIS — E2609 Other primary hyperaldosteronism: Secondary | ICD-10-CM | POA: Diagnosis not present

## 2018-02-01 DIAGNOSIS — R69 Illness, unspecified: Secondary | ICD-10-CM | POA: Diagnosis not present

## 2018-02-01 MED ORDER — BLOOD GLUCOSE METER KIT: PACK | 0 refills | Status: DC

## 2018-02-01 MED ORDER — METFORMIN HCL 500 MG PO TABS: 500.0000 mg | ORAL_TABLET | Freq: Two times a day (BID) | ORAL | 3 refills | Status: DC

## 2018-02-01 NOTE — Telephone Encounter (Signed)
Pt wanted to know about the b12 she took the supplement and it caused her to be sleepy, and have abdominal pain. She went to the ED and was told that it could've come from the supplement or she may have had a stomach bug. She said that her husband was not feeling well either.(diarrhea). She doesn't have diarrhea just   Balance B-100 natures blend (100 mg), was the supplement that she took. She was told that she can retry this to see if she can tolerate this for 1 wk.  She also said that there is another metformin that is smaller and not an extended release tablet that she believes she is able to take. She just wonders if she can get a meter and testing supplies. Maryruth Eve, [place]

## 2018-02-01 NOTE — Telephone Encounter (Signed)
Pt advised. Order placed.Jennifer [name] [place]

## 2018-02-01 NOTE — Telephone Encounter (Signed)
Okay, I think it was highly unlikely that the B12 was causing any stomach upset.  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  I think more she was more likely to have had a stomach bug especially if her husband was also not feeling great.  I did send over new prescription for the lower dose metformin it should be smaller.  She can also talk to the pharmacist as there are couple of different brands on the market and there may be one that is more than another.  In addition we can certainly send over a meter and testing supplies.  We will have to see if insurance covers it or not.

## 2018-02-02 ENCOUNTER — Other Ambulatory Visit: Payer: Self-pay

## 2018-02-02 ENCOUNTER — Encounter (HOSPITAL_BASED_OUTPATIENT_CLINIC_OR_DEPARTMENT_OTHER): Payer: Self-pay | Admitting: *Deleted

## 2018-02-02 DIAGNOSIS — R079 Chest pain, unspecified: Secondary | ICD-10-CM | POA: Diagnosis not present

## 2018-02-02 DIAGNOSIS — W231XXA Caught, crushed, jammed, or pinched between stationary objects, initial encounter: Secondary | ICD-10-CM | POA: Diagnosis not present

## 2018-02-02 DIAGNOSIS — S99921A Unspecified injury of right foot, initial encounter: Secondary | ICD-10-CM | POA: Insufficient documentation

## 2018-02-02 DIAGNOSIS — R110 Nausea: Secondary | ICD-10-CM | POA: Diagnosis not present

## 2018-02-02 DIAGNOSIS — R109 Unspecified abdominal pain: Secondary | ICD-10-CM | POA: Diagnosis not present

## 2018-02-02 DIAGNOSIS — Y939 Activity, unspecified: Secondary | ICD-10-CM | POA: Insufficient documentation

## 2018-02-02 DIAGNOSIS — Z5321 Procedure and treatment not carried out due to patient leaving prior to being seen by health care provider: Secondary | ICD-10-CM | POA: Insufficient documentation

## 2018-02-02 DIAGNOSIS — I10 Essential (primary) hypertension: Secondary | ICD-10-CM | POA: Diagnosis not present

## 2018-02-02 DIAGNOSIS — Z90710 Acquired absence of both cervix and uterus: Secondary | ICD-10-CM | POA: Diagnosis not present

## 2018-02-02 DIAGNOSIS — R0602 Shortness of breath: Secondary | ICD-10-CM | POA: Diagnosis not present

## 2018-02-02 DIAGNOSIS — Y998 Other external cause status: Secondary | ICD-10-CM | POA: Diagnosis not present

## 2018-02-02 DIAGNOSIS — Y929 Unspecified place or not applicable: Secondary | ICD-10-CM | POA: Insufficient documentation

## 2018-02-02 DIAGNOSIS — R1030 Lower abdominal pain, unspecified: Secondary | ICD-10-CM | POA: Diagnosis not present

## 2018-02-02 DIAGNOSIS — Z9049 Acquired absence of other specified parts of digestive tract: Secondary | ICD-10-CM | POA: Diagnosis not present

## 2018-02-02 DIAGNOSIS — R935 Abnormal findings on diagnostic imaging of other abdominal regions, including retroperitoneum: Secondary | ICD-10-CM | POA: Diagnosis not present

## 2018-02-02 NOTE — ED Triage Notes (Signed)
Pt reports right toe injury today. Edge of table hit her right big toe. Blood noted around border of toenail

## 2018-02-03 ENCOUNTER — Emergency Department (HOSPITAL_BASED_OUTPATIENT_CLINIC_OR_DEPARTMENT_OTHER)
Admission: EM | Admit: 2018-02-03 | Discharge: 2018-02-03 | Disposition: A | Discharge status: Home / Self Care | Payer: Medicare HMO | Attending: Emergency Medicine | Admitting: Emergency Medicine

## 2018-02-03 ENCOUNTER — Emergency Department (HOSPITAL_BASED_OUTPATIENT_CLINIC_OR_DEPARTMENT_OTHER): Payer: Medicare HMO

## 2018-02-03 DIAGNOSIS — W231XXA Caught, crushed, jammed, or pinched between stationary objects, initial encounter: Secondary | ICD-10-CM | POA: Diagnosis not present

## 2018-02-03 DIAGNOSIS — Z5321 Procedure and treatment not carried out due to patient leaving prior to being seen by health care provider: Secondary | ICD-10-CM | POA: Diagnosis not present

## 2018-02-03 DIAGNOSIS — S99921A Unspecified injury of right foot, initial encounter: Secondary | ICD-10-CM | POA: Diagnosis not present

## 2018-02-03 NOTE — ED Notes (Signed)
Pt not in room. Registration reports pt left.

## 2018-02-03 NOTE — ED Notes (Signed)
Pt states a solid wooden table fell on her right great toe. Presents with small amt of bleeding (oozing) at base of right toe. Swelling and bruising noted. Moves slightly. Feels touch. Cap refill < 3 sec. Pt states the tip of the toe is numb.

## 2018-02-03 NOTE — ED Notes (Signed)
Patient walked to and from the bathroom with no issues.

## 2018-02-04 NOTE — Addendum Note (Signed)
Addended by: Sherrie George F on: 02/04/2018 12:06 PM   Modules accepted: Orders

## 2018-02-05 ENCOUNTER — Telehealth: Payer: Self-pay | Admitting: *Deleted

## 2018-02-05 DIAGNOSIS — K220 Achalasia of cardia: Secondary | ICD-10-CM | POA: Insufficient documentation

## 2018-02-05 DIAGNOSIS — R198 Other specified symptoms and signs involving the digestive system and abdomen: Secondary | ICD-10-CM | POA: Diagnosis not present

## 2018-02-05 IMAGING — US US EXTREM UP*L* LTD
1 series · 9 of 9 positions shown · non-contrast
Comparison: None

CLINICAL DATA: Palpable lump in the left posterior mid arm
overlying the triceps muscle for 1 month

EXAM:
ULTRASOUND LEFT UPPER EXTREMITY LIMITED
TECHNIQUE: Ultrasound examination of the upper extremity soft tissues was
performed in the area of clinical concern.

[Series 1: us extrem up*left* ltd · 0.06mm/px · 9 of 9 slices shown]
[im 1/9]
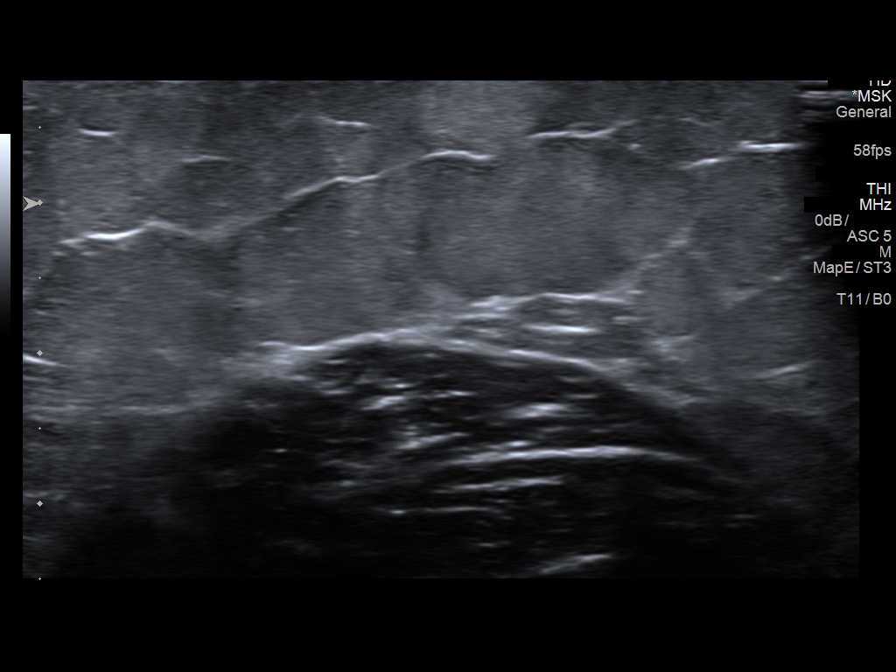
[im 2/9]
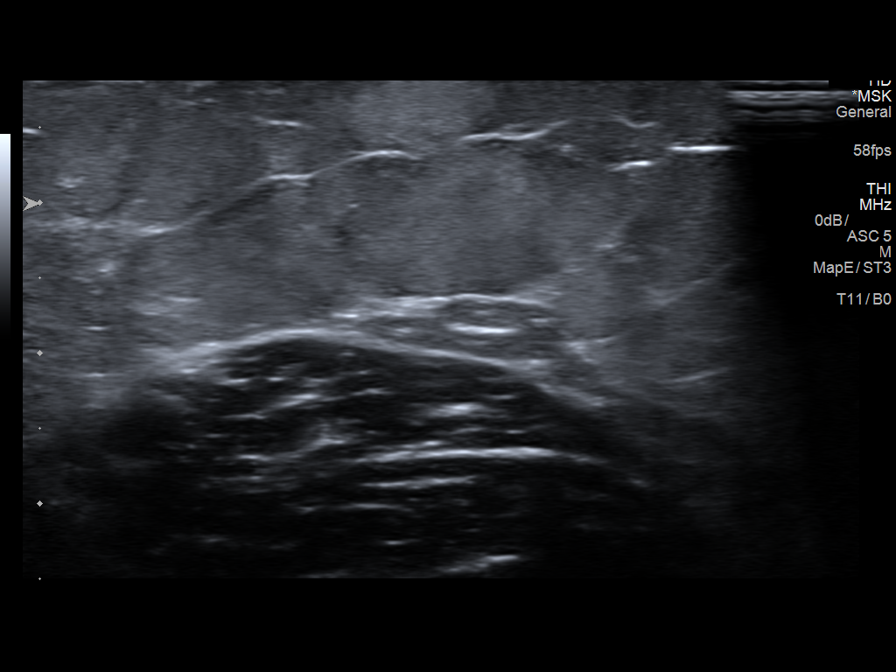
[im 3/9]
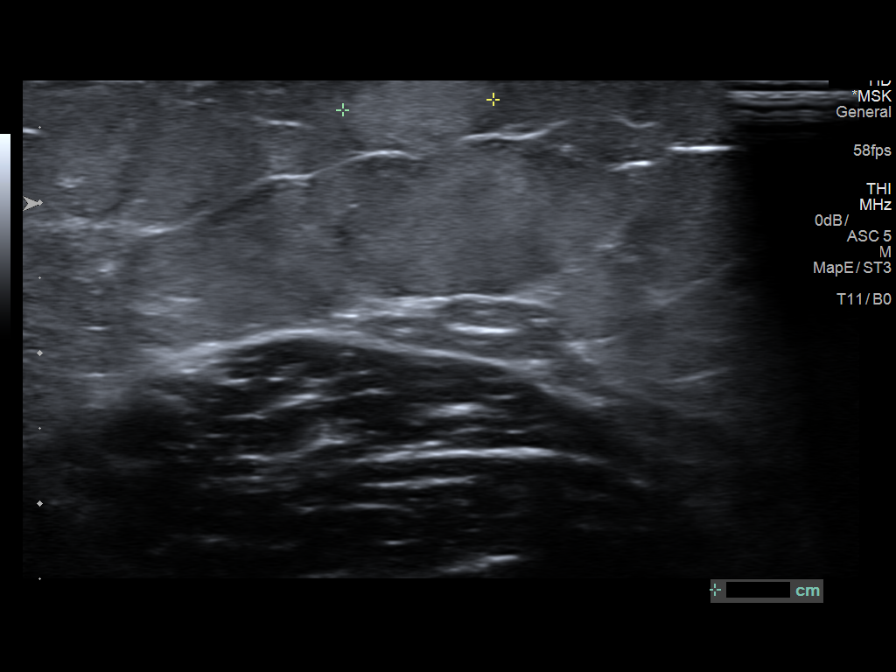
[im 4/9]
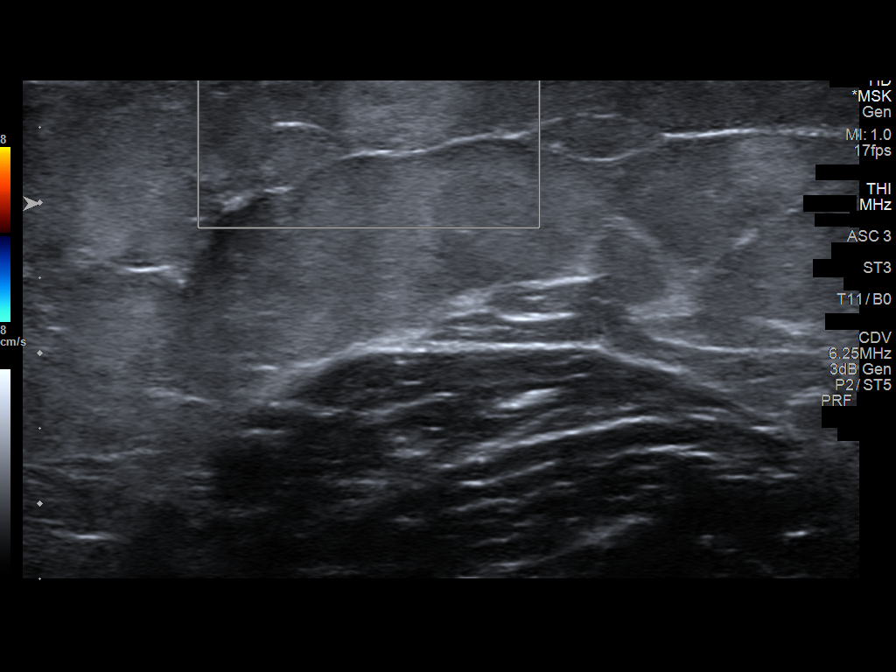
[im 5/9]
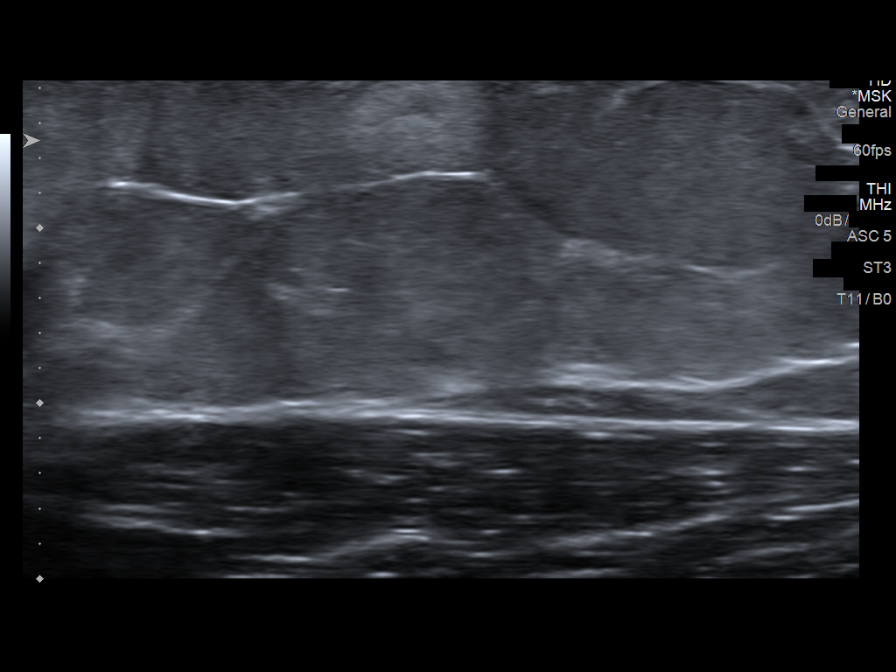
[im 6/9]
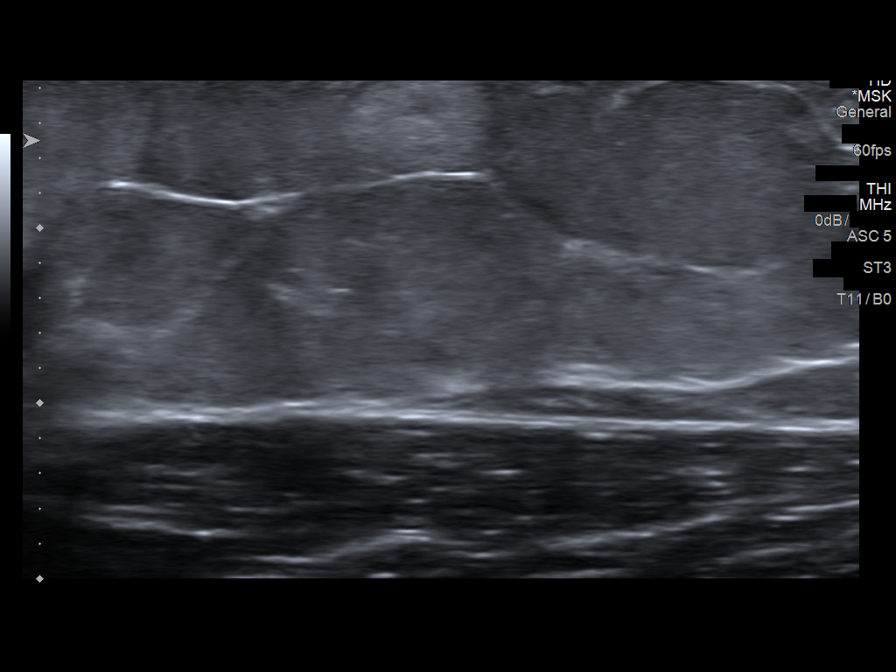
[im 7/9]
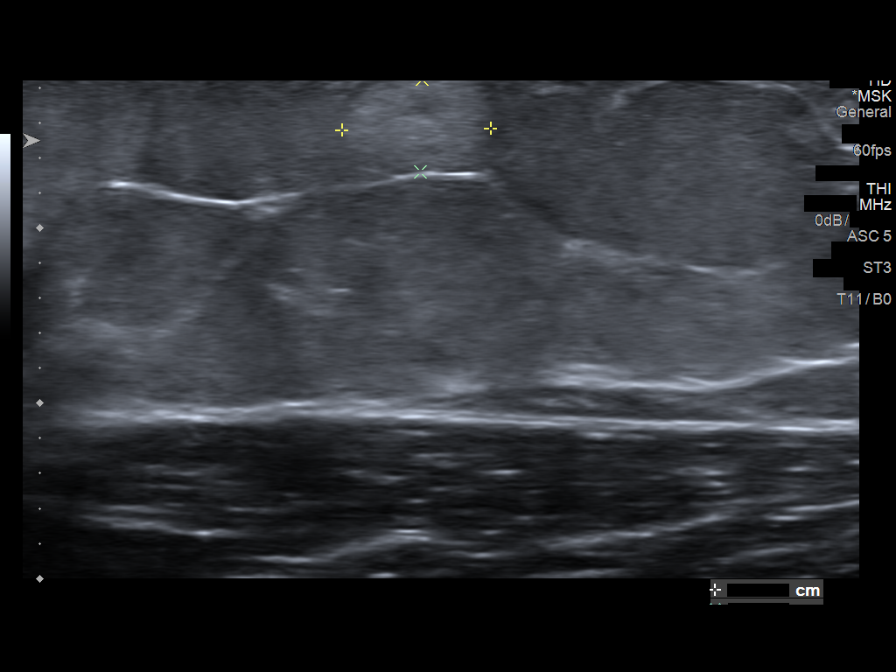
[im 8/9]
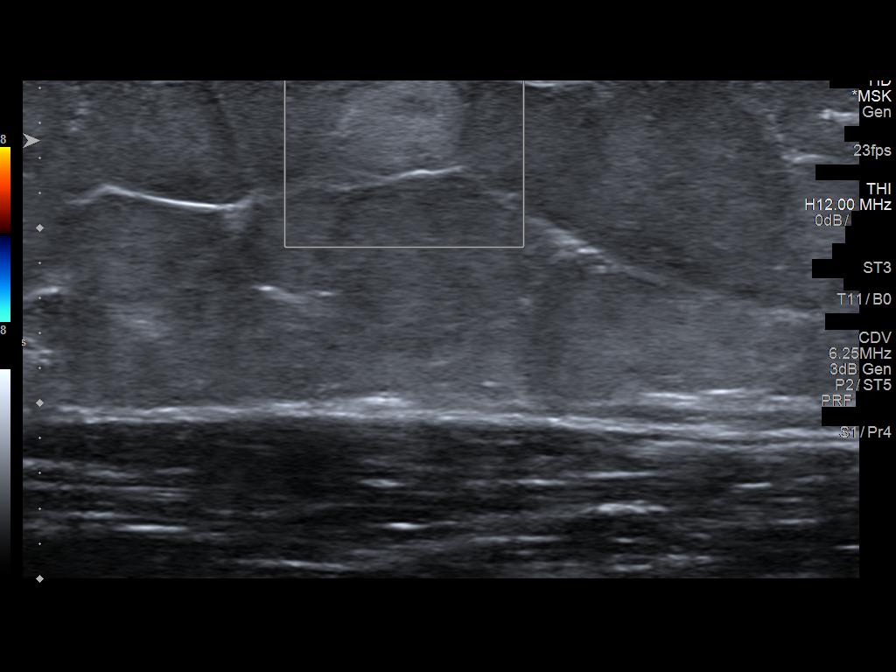
[im 9/9]
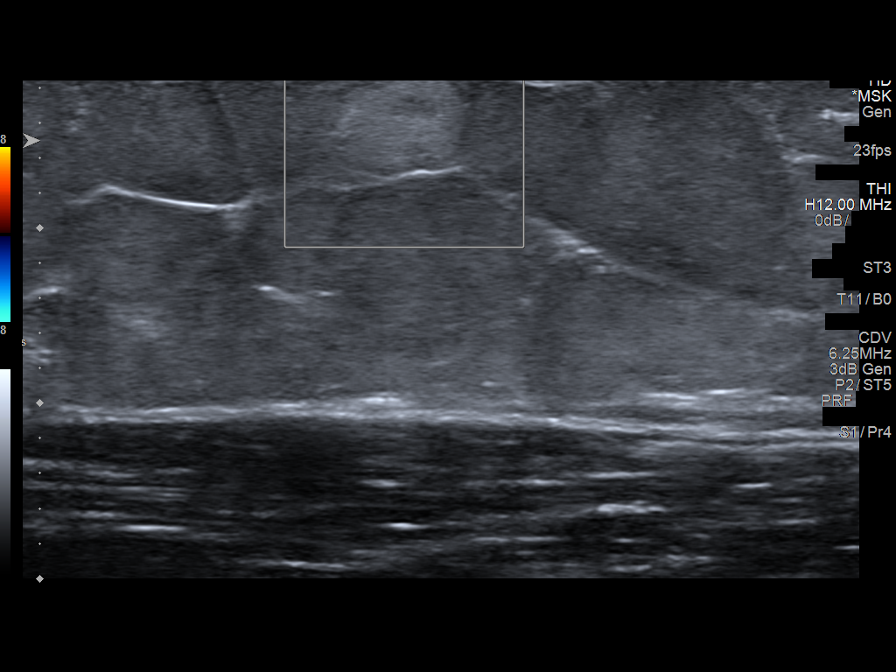

[9 of 9 positions shown; findings below may reference images not displayed]

FINDINGS: 0.9 x 0.5 x 1 cm well-circumscribed echogenic mass in the
subcutaneous fat just beneath the skin surface without internal
Doppler flow. The mass is located at the level of the triceps
muscle.

No posterior acoustic enhancement or shadowing.

No other soft tissue mass, fluid collection or hematoma. No
architectural distortion.
IMPRESSION: Echogenic 0.9 x 0.5 x 1 cm mass in the subcutaneous fat just beneath
the skin surface at the level of the triceps muscle. Appears is
concerning for a sebaceous cyst versus lipoma.
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
## 2018-02-05 NOTE — Telephone Encounter (Signed)
Please call pt to let her know that Dr. Madilyn Fireman can see her tomorrow @ 1120 .Jennifer [name] [place]

## 2018-02-05 NOTE — Telephone Encounter (Signed)
I called pt and informed her to come tomorrow at 11:20 for her appt

## 2018-02-06 ENCOUNTER — Ambulatory Visit (INDEPENDENT_AMBULATORY_CARE_PROVIDER_SITE_OTHER): Payer: Medicare HMO | Admitting: Family Medicine

## 2018-02-06 ENCOUNTER — Encounter: Payer: Self-pay | Admitting: Family Medicine

## 2018-02-06 VITALS — BP 142/78 | HR 84 | Ht 62.0 in | Wt 222.0 lb

## 2018-02-06 DIAGNOSIS — R600 Localized edema: Secondary | ICD-10-CM | POA: Diagnosis not present

## 2018-02-06 DIAGNOSIS — R7303 Prediabetes: Secondary | ICD-10-CM | POA: Diagnosis not present

## 2018-02-06 DIAGNOSIS — F430 Acute stress reaction: Secondary | ICD-10-CM | POA: Diagnosis not present

## 2018-02-06 DIAGNOSIS — R69 Illness, unspecified: Secondary | ICD-10-CM | POA: Diagnosis not present

## 2018-02-06 DIAGNOSIS — S99921A Unspecified injury of right foot, initial encounter: Secondary | ICD-10-CM | POA: Diagnosis not present

## 2018-02-06 DIAGNOSIS — I10 Essential (primary) hypertension: Secondary | ICD-10-CM

## 2018-02-06 LAB — VITAMIN B6: Vitamin B6 (pyridoxal-5-phoshate): 6

## 2018-02-06 MED ORDER — ONETOUCH VERIO VI SOLN: 99 refills | Status: DC

## 2018-02-06 NOTE — Patient Instructions (Signed)
Please check your blood sugar in the morning, fasting, before first meal the day on 2 days/week.  You can either write those down on a paper log or bring in your meter and we can look through your numbers when you come back again.
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
## 2018-02-07 ENCOUNTER — Encounter: Payer: Self-pay | Admitting: Family Medicine

## 2018-02-12 ENCOUNTER — Ambulatory Visit: Payer: Self-pay | Admitting: Family Medicine

## 2018-02-15 ENCOUNTER — Encounter: Payer: Self-pay | Admitting: Family Medicine

## 2018-02-15 ENCOUNTER — Ambulatory Visit (INDEPENDENT_AMBULATORY_CARE_PROVIDER_SITE_OTHER): Payer: Medicare HMO | Admitting: Family Medicine

## 2018-02-15 VITALS — BP 137/80 | HR 86 | Ht 62.0 in | Wt 221.0 lb

## 2018-02-15 DIAGNOSIS — R053 Chronic cough: Secondary | ICD-10-CM

## 2018-02-15 DIAGNOSIS — I10 Essential (primary) hypertension: Secondary | ICD-10-CM | POA: Diagnosis not present

## 2018-02-15 DIAGNOSIS — R1310 Dysphagia, unspecified: Secondary | ICD-10-CM

## 2018-02-15 DIAGNOSIS — R05 Cough: Secondary | ICD-10-CM | POA: Diagnosis not present

## 2018-02-15 NOTE — Progress Notes (Signed)
Subjective:    CC: Cough, choking.    HPI:  52 year old female comes in today because she is feeling the thickness in her throat again.  She is had this happen recurrently over the last several years.  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  More recently though she is had some green and brown nasal discharge so it has been more green the last couple of days.  She just feels like there is something stuck in her throat and that she has to clear it.  She has had a little bit of pain with swallowing and discomfort.  She did see GI and they recommended that she follow back up with ENT with Dr. Rowe Clack.  She does feel like she is a little bit more swollen externally on the right side of her neck.  No fevers chills or sweats.  She is not currently taking any cough or cold medication.  Also yesterday while she was at the pool she got choked on some pool water and says she coughed and coughed and felt like temporarily she could not breathe.  She is feeling better today.  Thought she might need steroids again. She has had green and brown nasal discharge.    Hypertension- Pt denies chest pain, SOB, dizziness, or heart palpitations.  Taking meds as directed w/o problems.  Denies medication side effects.      Past medical history, Surgical history, Family history not pertinant except as noted below, Social history, Allergies, and medications have been entered into the medical record, reviewed, and corrections made.   Review of Systems: No fevers, chills, night sweats, weight loss, chest pain, or shortness of breath.   Objective:    General: Well Developed, well nourished, and in no acute distress.  Neuro: Alert and oriented x3, extra-ocular muscles intact, sensation grossly intact.  HEENT: Normocephalic, atraumatic  Skin: Warm and dry, no rashes. Cardiac: Regular rate and rhythm, no murmurs rubs or gallops, no lower  extremity edema.  Respiratory: Clear to auscultation bilaterally. Not using accessory muscles, speaking in full sentences.   Impression and Recommendations:    Painful swallowing/"thick throat" - stay away from dairy.  This is often a big trigger for her especially if she is been eating dairy for multiple days in a row which she has.  I do not see anything significant enough to put her on steroids.  She is talking normally, breathing normally, glottic clear lung sounds.  Everything looks reassuring on exam.  She does not want to follow-up with ENT at this point because her new ENT is not willing to do the Botox and she feels like that the only thing that was really helpful.  I reviewed with her the importance of voice rest, not coughing because this continues to irritate the throat.  He can use lozenges if needed to try to suppress the cough.  Nasal congestion with discharge-likely viral and/or environmental.  Encouraged her to just keep an eye on it and let me know if it is getting worse.  If she is not improving then we can consider treatment with antibiotics if needed.  HTN - Well controlled. Continue current regimen. Follow up in 4 months.

## 2018-02-18 DIAGNOSIS — Y999 Unspecified external cause status: Secondary | ICD-10-CM | POA: Diagnosis not present

## 2018-02-18 DIAGNOSIS — W228XXA Striking against or struck by other objects, initial encounter: Secondary | ICD-10-CM | POA: Diagnosis not present

## 2018-02-18 DIAGNOSIS — R079 Chest pain, unspecified: Secondary | ICD-10-CM | POA: Diagnosis not present

## 2018-02-18 DIAGNOSIS — R072 Precordial pain: Secondary | ICD-10-CM | POA: Diagnosis not present

## 2018-02-18 DIAGNOSIS — R61 Generalized hyperhidrosis: Secondary | ICD-10-CM | POA: Diagnosis not present

## 2018-02-18 DIAGNOSIS — M25562 Pain in left knee: Secondary | ICD-10-CM | POA: Diagnosis not present

## 2018-02-19 DIAGNOSIS — R079 Chest pain, unspecified: Secondary | ICD-10-CM | POA: Diagnosis not present

## 2018-02-22 ENCOUNTER — Emergency Department (HOSPITAL_BASED_OUTPATIENT_CLINIC_OR_DEPARTMENT_OTHER): Payer: Medicare HMO

## 2018-02-22 ENCOUNTER — Other Ambulatory Visit: Payer: Self-pay

## 2018-02-22 ENCOUNTER — Emergency Department (HOSPITAL_BASED_OUTPATIENT_CLINIC_OR_DEPARTMENT_OTHER)
Admission: EM | Admit: 2018-02-22 | Discharge: 2018-02-22 | Disposition: A | Discharge status: Home / Self Care | Payer: Medicare HMO | Attending: Emergency Medicine | Admitting: Emergency Medicine

## 2018-02-22 ENCOUNTER — Encounter (HOSPITAL_BASED_OUTPATIENT_CLINIC_OR_DEPARTMENT_OTHER): Payer: Self-pay | Admitting: *Deleted

## 2018-02-22 DIAGNOSIS — R112 Nausea with vomiting, unspecified: Secondary | ICD-10-CM | POA: Insufficient documentation

## 2018-02-22 DIAGNOSIS — J45909 Unspecified asthma, uncomplicated: Secondary | ICD-10-CM | POA: Insufficient documentation

## 2018-02-22 DIAGNOSIS — Z87891 Personal history of nicotine dependence: Secondary | ICD-10-CM | POA: Diagnosis not present

## 2018-02-22 DIAGNOSIS — Z7984 Long term (current) use of oral hypoglycemic drugs: Secondary | ICD-10-CM | POA: Insufficient documentation

## 2018-02-22 DIAGNOSIS — Z8541 Personal history of malignant neoplasm of cervix uteri: Secondary | ICD-10-CM | POA: Diagnosis not present

## 2018-02-22 DIAGNOSIS — I10 Essential (primary) hypertension: Secondary | ICD-10-CM | POA: Insufficient documentation

## 2018-02-22 DIAGNOSIS — R109 Unspecified abdominal pain: Secondary | ICD-10-CM | POA: Insufficient documentation

## 2018-02-22 DIAGNOSIS — Z79899 Other long term (current) drug therapy: Secondary | ICD-10-CM | POA: Insufficient documentation

## 2018-02-22 LAB — URINALYSIS, ROUTINE W REFLEX MICROSCOPIC
Bilirubin Urine: NEGATIVE
Glucose, UA: NEGATIVE mg/dL
Hgb urine dipstick: NEGATIVE
Ketones, ur: NEGATIVE mg/dL
Leukocytes, UA: NEGATIVE
Nitrite: NEGATIVE
Protein, ur: NEGATIVE mg/dL
Specific Gravity, Urine: 1.025 (ref 1.005–1.030)
pH: 6 (ref 5.0–8.0)

## 2018-02-22 LAB — COMPREHENSIVE METABOLIC PANEL
ALT: 38 U/L (ref 0–44)
AST: 35 U/L (ref 15–41)
Albumin: 3.7 g/dL (ref 3.5–5.0)
Alkaline Phosphatase: 80 U/L (ref 38–126)
Anion gap: 9 (ref 5–15)
BUN: 14 mg/dL (ref 6–20)
CO2: 23 mmol/L (ref 22–32)
Calcium: 8.5 mg/dL — ABNORMAL LOW (ref 8.9–10.3)
Chloride: 109 mmol/L (ref 98–111)
Creatinine, Ser: 0.52 mg/dL (ref 0.44–1.00)
GFR calc Af Amer: >60 mL/min (ref >60)
GFR calc non Af Amer: >60 mL/min (ref >60)
Glucose, Bld: 164 mg/dL — ABNORMAL HIGH (ref 70–99)
Potassium: 3.8 mmol/L (ref 3.5–5.1)
Sodium: 141 mmol/L (ref 135–145)
Total Bilirubin: 0.3 mg/dL (ref 0.3–1.2)
Total Protein: 7.1 g/dL (ref 6.5–8.1)

## 2018-02-22 LAB — CBC WITH DIFFERENTIAL/PLATELET
Basophils Absolute: 0 10*3/uL (ref 0.0–0.1)
Basophils Relative: 0 %
Eosinophils Absolute: 0.2 10*3/uL (ref 0.0–0.7)
Eosinophils Relative: 2 %
HCT: 40.4 % (ref 36.0–46.0)
Hemoglobin: 13.5 g/dL (ref 12.0–15.0)
Lymphocytes Relative: 20 %
Lymphs Abs: 1.5 10*3/uL (ref 0.7–4.0)
MCH: 29.6 pg (ref 26.0–34.0)
MCHC: 33.4 g/dL (ref 30.0–36.0)
MCV: 88.6 fL (ref 78.0–100.0)
Monocytes Absolute: 0.7 10*3/uL (ref 0.1–1.0)
Monocytes Relative: 10 %
Neutro Abs: 5.1 10*3/uL (ref 1.7–7.7)
Neutrophils Relative %: 68 %
Platelets: 187 10*3/uL (ref 150–400)
RBC: 4.56 MIL/uL (ref 3.87–5.11)
RDW: 14 % (ref 11.5–15.5)
WBC: 7.5 10*3/uL (ref 4.0–10.5)

## 2018-02-22 LAB — LIPASE, BLOOD: Lipase: 41 U/L (ref 11–51)

## 2018-02-22 MED ORDER — ONDANSETRON HCL 4 MG PO TABS: 4.0000 mg | ORAL_TABLET | Freq: Three times a day (TID) | ORAL | 0 refills | Status: DC | PRN

## 2018-02-22 MED ORDER — DICYCLOMINE HCL 10 MG PO CAPS
10.0000 mg | ORAL_CAPSULE | Freq: Once | ORAL | Status: AC
Start: 2018-02-22 — End: 2018-02-22
  Administered 2018-02-22: 10 mg via ORAL
  Filled 2018-02-22: qty 1

## 2018-02-22 MED ORDER — ONDANSETRON 4 MG PO TBDP
4.0000 mg | ORAL_TABLET | Freq: Once | ORAL | Status: AC
  Administered 2018-02-22: 4 mg via ORAL
  Filled 2018-02-22: qty 1

## 2018-02-22 MED ORDER — DICYCLOMINE HCL 20 MG PO TABS: 20.0000 mg | ORAL_TABLET | Freq: Two times a day (BID) | ORAL | 0 refills | Status: DC | PRN

## 2018-02-22 NOTE — ED Notes (Signed)
ED Provider at bedside. 

## 2018-02-22 NOTE — Discharge Instructions (Addendum)
Gradually advance diet from clear liquids to bland diet to your normal regular diet as tolerated.  Follow-up with your primary physician and return for worsening pain, persistent vomiting or for any concerns.

## 2018-02-22 NOTE — ED Triage Notes (Signed)
Abdominal pain. Nausea.
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
## 2018-02-26 DIAGNOSIS — R002 Palpitations: Secondary | ICD-10-CM | POA: Diagnosis not present

## 2018-02-26 DIAGNOSIS — R079 Chest pain, unspecified: Secondary | ICD-10-CM | POA: Diagnosis not present

## 2018-02-26 DIAGNOSIS — J9811 Atelectasis: Secondary | ICD-10-CM | POA: Diagnosis not present

## 2018-02-26 DIAGNOSIS — R072 Precordial pain: Secondary | ICD-10-CM | POA: Diagnosis not present

## 2018-02-26 DIAGNOSIS — R9431 Abnormal electrocardiogram [ECG] [EKG]: Secondary | ICD-10-CM | POA: Diagnosis not present

## 2018-02-26 DIAGNOSIS — J984 Other disorders of lung: Secondary | ICD-10-CM | POA: Diagnosis not present

## 2018-02-27 DIAGNOSIS — S43021A Posterior subluxation of right humerus, initial encounter: Secondary | ICD-10-CM | POA: Diagnosis not present

## 2018-02-27 DIAGNOSIS — Z87891 Personal history of nicotine dependence: Secondary | ICD-10-CM | POA: Diagnosis not present

## 2018-02-27 DIAGNOSIS — S46111A Strain of muscle, fascia and tendon of long head of biceps, right arm, initial encounter: Secondary | ICD-10-CM | POA: Diagnosis not present

## 2018-02-27 DIAGNOSIS — E119 Type 2 diabetes mellitus without complications: Secondary | ICD-10-CM | POA: Diagnosis not present

## 2018-02-27 DIAGNOSIS — E785 Hyperlipidemia, unspecified: Secondary | ICD-10-CM | POA: Diagnosis not present

## 2018-02-27 DIAGNOSIS — Z885 Allergy status to narcotic agent status: Secondary | ICD-10-CM | POA: Diagnosis not present

## 2018-02-27 DIAGNOSIS — X58XXXA Exposure to other specified factors, initial encounter: Secondary | ICD-10-CM | POA: Diagnosis not present

## 2018-02-27 DIAGNOSIS — R002 Palpitations: Secondary | ICD-10-CM | POA: Diagnosis not present

## 2018-02-27 DIAGNOSIS — K219 Gastro-esophageal reflux disease without esophagitis: Secondary | ICD-10-CM | POA: Diagnosis not present

## 2018-02-27 DIAGNOSIS — J9811 Atelectasis: Secondary | ICD-10-CM | POA: Diagnosis not present

## 2018-02-27 DIAGNOSIS — M25811 Other specified joint disorders, right shoulder: Secondary | ICD-10-CM | POA: Diagnosis not present

## 2018-02-27 DIAGNOSIS — M19011 Primary osteoarthritis, right shoulder: Secondary | ICD-10-CM | POA: Diagnosis not present

## 2018-02-27 DIAGNOSIS — J984 Other disorders of lung: Secondary | ICD-10-CM | POA: Diagnosis not present

## 2018-02-27 DIAGNOSIS — G35 Multiple sclerosis: Secondary | ICD-10-CM | POA: Diagnosis not present

## 2018-02-27 DIAGNOSIS — I10 Essential (primary) hypertension: Secondary | ICD-10-CM | POA: Diagnosis not present

## 2018-02-27 DIAGNOSIS — Z888 Allergy status to other drugs, medicaments and biological substances status: Secondary | ICD-10-CM | POA: Diagnosis not present

## 2018-02-27 DIAGNOSIS — M25511 Pain in right shoulder: Secondary | ICD-10-CM | POA: Diagnosis not present

## 2018-02-27 DIAGNOSIS — R072 Precordial pain: Secondary | ICD-10-CM | POA: Diagnosis not present

## 2018-02-27 DIAGNOSIS — S43431A Superior glenoid labrum lesion of right shoulder, initial encounter: Secondary | ICD-10-CM | POA: Diagnosis not present

## 2018-02-27 DIAGNOSIS — S43491A Other sprain of right shoulder joint, initial encounter: Secondary | ICD-10-CM | POA: Diagnosis not present

## 2018-02-27 DIAGNOSIS — R079 Chest pain, unspecified: Secondary | ICD-10-CM | POA: Diagnosis not present

## 2018-02-28 ENCOUNTER — Ambulatory Visit (INDEPENDENT_AMBULATORY_CARE_PROVIDER_SITE_OTHER): Payer: Medicare HMO | Admitting: Family Medicine

## 2018-02-28 ENCOUNTER — Encounter: Payer: Self-pay | Admitting: Family Medicine

## 2018-02-28 VITALS — BP 136/72 | HR 80 | Ht 62.0 in | Wt 222.0 lb

## 2018-02-28 DIAGNOSIS — I471 Supraventricular tachycardia: Secondary | ICD-10-CM

## 2018-02-28 DIAGNOSIS — I10 Essential (primary) hypertension: Secondary | ICD-10-CM | POA: Diagnosis not present

## 2018-02-28 DIAGNOSIS — R5383 Other fatigue: Secondary | ICD-10-CM

## 2018-02-28 DIAGNOSIS — R7303 Prediabetes: Secondary | ICD-10-CM

## 2018-02-28 MED ORDER — SODIUM CHLORIDE 0.9 % IV SOLN
20.00 | INTRAVENOUS | Status: DC
Start: ? — End: 2018-02-28

## 2018-02-28 NOTE — Progress Notes (Signed)
Subjective:    CC: Palpitations  HPI: Hypertension-her blood pressure has been very elevated recently.  She is not exactly sure why but she actually went to the emergency department a couple of days ago because she was starting to experience some palpitations..  She feels like this is been getting gradually worse over the last year where when she expenses the palpitations she actually gets when he gets a headache and just feels bad in general worse before she would just feel her heart racing and feel little tired afterwards.  She notices a big difference if she forgets to take her metoprolol or takes it later than usual.  Was also experiencing some left-sided chest pain and tingling down her left arm when she went to the emergency department.  They spoke with the cardiologist on call and told her to take 100 mg of metoprolol consistently.  Previously she was just taking 100 mg when needed but was taking 50 mg twice a day on a regular basis.  She also just feels a little bit more agitated because her son stepchildren are now with them all the time in their home.  Impaired fasting glucose-she has not started the metformin yet but did start using her glucometer.  She is checked her blood sugar 3 times.  To the fasting blood sugars were under 130.  Past medical history, Surgical history, Family history not pertinant except as noted below, Social history, Allergies, and medications have been entered into the medical record, reviewed, and corrections made.   Review of Systems: No fevers, chills, night sweats, weight loss, chest pain, or shortness of breath.   Objective:    General: Well Developed, well nourished, and in no acute distress.  Neuro: Alert and oriented x3, extra-ocular muscles intact, sensation grossly intact.  HEENT: Normocephalic, atraumatic  Skin: Warm and dry, no rashes. Cardiac: Regular rate and rhythm, no murmurs rubs or gallops, no lower extremity edema.  Respiratory: Clear to  auscultation bilaterally. Not using accessory muscles, speaking in full sentences.   Impression and Recommendations:     Hypertension-blood pressure actually looks much better this morning.  Encouraged her to stick with 100 mg twice a day or at least take 75.  Sometimes she gets a little nervous but taking the higher dose.  I will write on a range of blood pressures for her that might be helpful as a guide.  IFG - wait to start metformin after see Cards on Tuesday.   Palpitations-keep follow-up appointment with cardiology on Tuesday.  She has a history of SVT.  Encouraged her to stick with 100 mg twice a day or at least take 75.  Sometimes she gets a little nervous but taking the higher dose  We decided to hold off on treating her skin tags around her neck today we will do that at the next visit.  Time spent 40 minutes, greater than 50% of time spent face-to-face counseling about blood pressure, palpitations and impaired fasting glucose.

## 2018-02-28 NOTE — Patient Instructions (Addendum)
BP Goal Optimal : 115-130/75-85 High BP: > 140/90 Very High BP . 160/100 Low BP: <110/65 Very Low BP: < 95/50  Please start the metformin after you see Cardiology

## 2018-03-01 ENCOUNTER — Ambulatory Visit (INDEPENDENT_AMBULATORY_CARE_PROVIDER_SITE_OTHER): Payer: Medicare HMO | Admitting: Family Medicine

## 2018-03-01 ENCOUNTER — Encounter: Payer: Self-pay | Admitting: Family Medicine

## 2018-03-01 VITALS — BP 148/97 | HR 81 | Ht 62.0 in | Wt 222.0 lb

## 2018-03-01 DIAGNOSIS — M25511 Pain in right shoulder: Secondary | ICD-10-CM

## 2018-03-01 NOTE — Patient Instructions (Signed)
We will refer you to an orthopedic surgeon to discuss possible arthroscopy of your shoulder. Follow up with me after this and we can review your options. Conservative treatment typically involves physical therapy, injections, nitro patches.

## 2018-03-03 ENCOUNTER — Encounter: Payer: Self-pay | Admitting: Family Medicine

## 2018-03-03 NOTE — Progress Notes (Signed)
Patient returned today to go over her MRI arthrogram of her right shoulder.  She continues to struggle with pain in this shoulder.  We also reviewed the arthrogram from 03/2015.  She has several issues that appear new compared to her old MR arthrogram.  Posteroinferior labral tear with small long head biceps tear, mild tendinopathy, possible instability but this is suggested more by location of humeral head in glenoid.  We discussed conservative measures to include physical therapy, nitro patches, injections (but she's allergic to steroids - she thinks there's one specific one she's not allergic to) vs ortho consultation - advised she at least consult with ortho about possible surgical options.  She has a lot of other medical conditions, believe additional conservative treatment is in her best interest.

## 2018-03-04 ENCOUNTER — Emergency Department (HOSPITAL_BASED_OUTPATIENT_CLINIC_OR_DEPARTMENT_OTHER): Payer: Medicare HMO

## 2018-03-04 ENCOUNTER — Encounter (HOSPITAL_BASED_OUTPATIENT_CLINIC_OR_DEPARTMENT_OTHER): Payer: Self-pay | Admitting: *Deleted

## 2018-03-04 ENCOUNTER — Emergency Department (HOSPITAL_BASED_OUTPATIENT_CLINIC_OR_DEPARTMENT_OTHER)
Admission: EM | Admit: 2018-03-04 | Discharge: 2018-03-04 | Disposition: A | Discharge status: Home / Self Care | Payer: Medicare HMO | Attending: Emergency Medicine | Admitting: Emergency Medicine

## 2018-03-04 ENCOUNTER — Other Ambulatory Visit: Payer: Self-pay

## 2018-03-04 DIAGNOSIS — Y92009 Unspecified place in unspecified non-institutional (private) residence as the place of occurrence of the external cause: Secondary | ICD-10-CM | POA: Insufficient documentation

## 2018-03-04 DIAGNOSIS — J45909 Unspecified asthma, uncomplicated: Secondary | ICD-10-CM | POA: Insufficient documentation

## 2018-03-04 DIAGNOSIS — Z87891 Personal history of nicotine dependence: Secondary | ICD-10-CM | POA: Diagnosis not present

## 2018-03-04 DIAGNOSIS — Z9049 Acquired absence of other specified parts of digestive tract: Secondary | ICD-10-CM | POA: Diagnosis not present

## 2018-03-04 DIAGNOSIS — Y998 Other external cause status: Secondary | ICD-10-CM | POA: Diagnosis not present

## 2018-03-04 DIAGNOSIS — I10 Essential (primary) hypertension: Secondary | ICD-10-CM | POA: Diagnosis not present

## 2018-03-04 DIAGNOSIS — Z8542 Personal history of malignant neoplasm of other parts of uterus: Secondary | ICD-10-CM | POA: Diagnosis not present

## 2018-03-04 DIAGNOSIS — Z8544 Personal history of malignant neoplasm of other female genital organs: Secondary | ICD-10-CM | POA: Insufficient documentation

## 2018-03-04 DIAGNOSIS — Z79899 Other long term (current) drug therapy: Secondary | ICD-10-CM | POA: Insufficient documentation

## 2018-03-04 DIAGNOSIS — S99922A Unspecified injury of left foot, initial encounter: Secondary | ICD-10-CM | POA: Insufficient documentation

## 2018-03-04 DIAGNOSIS — R0981 Nasal congestion: Secondary | ICD-10-CM | POA: Diagnosis not present

## 2018-03-04 DIAGNOSIS — W2203XA Walked into furniture, initial encounter: Secondary | ICD-10-CM | POA: Insufficient documentation

## 2018-03-04 DIAGNOSIS — Y9389 Activity, other specified: Secondary | ICD-10-CM | POA: Insufficient documentation

## 2018-03-04 DIAGNOSIS — G35 Multiple sclerosis: Secondary | ICD-10-CM | POA: Diagnosis not present

## 2018-03-04 NOTE — Addendum Note (Signed)
Addended by: Sherrie George F on: 03/04/2018 09:41 AM   Modules accepted: Orders
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
## 2018-03-04 NOTE — Discharge Instructions (Addendum)
Please read and follow all provided instructions.  You have been seen today for an injury to your left fifth toe as well as upper respiratory infection type symptoms.  Tests performed today include: An x-ray of the affected area - does NOT show any broken bones or dislocations.  Vital signs. See below for your results today.   Home care instructions: -- *PRICE in the first 24-48 hours after injury: Protect -avoid bumping the toe against things. Rest Ice- Do not apply ice pack directly to your skin, place towel or similar between your skin and ice/ice pack. Apply ice for 20 min, then remove for 40 min while awake Compression- Wear brace, elastic bandage, splint as directed by your provider- be sure to wear appropriate fitting shoes. Elevate affected extremity above the level of your heart when not walking around for the first 24-48 hours   Regarding your upper respiratory infection type symptoms we suspect these are viral and/or allergies, please take over-the-counter medications utilize nasal rinses as needed.  Follow-up instructions: Please follow-up with your primary care provider in 3 days for reevaluation of your symptoms  Return instructions:  Please return to the Emergency Department if you experience worsening symptoms.  Please return if you have any other emergent concerns. Additional Information:  Your vital signs today were: BP (!) 154/85 (BP Location: Left Arm)    Pulse 87    Temp 98.9 F (37.2 C) (Oral)    Resp 16    Ht 5\' 2"  (1.575 m)    Wt 100.7 kg (222 lb)    LMP 06/25/2013    SpO2 98%    BMI 40.60 kg/m  If your blood pressure (BP) was elevated above 135/85 this visit, please have this repeated by your doctor within one month. ---------------

## 2018-03-04 NOTE — ED Triage Notes (Signed)
Pt c/o left 5th toe injury x 1 day ago also c/o nasal congestion

## 2018-03-05 DIAGNOSIS — I471 Supraventricular tachycardia: Secondary | ICD-10-CM | POA: Diagnosis not present

## 2018-03-05 DIAGNOSIS — I493 Ventricular premature depolarization: Secondary | ICD-10-CM | POA: Diagnosis not present

## 2018-03-05 DIAGNOSIS — I10 Essential (primary) hypertension: Secondary | ICD-10-CM | POA: Diagnosis not present

## 2018-03-07 ENCOUNTER — Ambulatory Visit: Payer: Medicare HMO | Admitting: Psychology

## 2018-03-07 DIAGNOSIS — F331 Major depressive disorder, recurrent, moderate: Secondary | ICD-10-CM | POA: Diagnosis not present

## 2018-03-07 DIAGNOSIS — R69 Illness, unspecified: Secondary | ICD-10-CM | POA: Diagnosis not present

## 2018-03-12 DIAGNOSIS — R0981 Nasal congestion: Secondary | ICD-10-CM | POA: Diagnosis not present

## 2018-03-12 DIAGNOSIS — H9202 Otalgia, left ear: Secondary | ICD-10-CM | POA: Diagnosis not present

## 2018-03-13 ENCOUNTER — Ambulatory Visit: Payer: Medicare HMO | Admitting: Psychology

## 2018-03-14 NOTE — Telephone Encounter (Signed)
MR arthrogram completed

## 2018-03-19 DIAGNOSIS — R0981 Nasal congestion: Secondary | ICD-10-CM | POA: Diagnosis not present

## 2018-03-19 DIAGNOSIS — R479 Unspecified speech disturbances: Secondary | ICD-10-CM | POA: Diagnosis not present

## 2018-03-19 DIAGNOSIS — H9201 Otalgia, right ear: Secondary | ICD-10-CM | POA: Diagnosis not present

## 2018-03-19 DIAGNOSIS — R05 Cough: Secondary | ICD-10-CM | POA: Diagnosis not present

## 2018-03-19 DIAGNOSIS — J0100 Acute maxillary sinusitis, unspecified: Secondary | ICD-10-CM | POA: Diagnosis not present

## 2018-03-19 DIAGNOSIS — R0982 Postnasal drip: Secondary | ICD-10-CM | POA: Diagnosis not present

## 2018-03-20 ENCOUNTER — Encounter: Payer: Self-pay | Admitting: Family Medicine

## 2018-03-20 ENCOUNTER — Ambulatory Visit (INDEPENDENT_AMBULATORY_CARE_PROVIDER_SITE_OTHER): Payer: Medicare HMO | Admitting: Family Medicine

## 2018-03-20 VITALS — BP 142/84 | HR 76 | Ht 62.0 in | Wt 223.0 lb

## 2018-03-20 DIAGNOSIS — M79671 Pain in right foot: Secondary | ICD-10-CM

## 2018-03-20 DIAGNOSIS — I10 Essential (primary) hypertension: Secondary | ICD-10-CM | POA: Diagnosis not present

## 2018-03-20 DIAGNOSIS — R0602 Shortness of breath: Secondary | ICD-10-CM | POA: Diagnosis not present

## 2018-03-20 DIAGNOSIS — R2681 Unsteadiness on feet: Secondary | ICD-10-CM

## 2018-03-20 DIAGNOSIS — R0981 Nasal congestion: Secondary | ICD-10-CM

## 2018-03-20 DIAGNOSIS — M79672 Pain in left foot: Secondary | ICD-10-CM | POA: Diagnosis not present

## 2018-03-20 DIAGNOSIS — R29898 Other symptoms and signs involving the musculoskeletal system: Secondary | ICD-10-CM | POA: Diagnosis not present

## 2018-03-20 NOTE — Progress Notes (Addendum)
Subjective:    CC: multiple medication problems.    HPI:  Hypertension- Pt denies chest pain, SOB, dizziness, or heart palpitations.  Taking meds as directed w/o problems.  Denies medication side effects.    SOB x2 to 3 weeks-she is also just felt more short of breath lately.  She is been monitoring her pulse ox.  Normally she runs between 9800% but more recently has been between 94-95% so she is gone to the ED a couple of times.  They did do a chest x-ray about a week ago which showed a little bit of atelectasis but no sign of pneumonia or infection.  She has not been running a fever or temperature.  She is still having problems with sinues, throat and eas. She is having a lot of post nasal drip.  He has continued to do her sinus rinses regularly.  She also reports that she has been feeling weak in her legs and lower extremity.  She says even just getting up and trying to walk as an extreme effort for her she keeps trying to to do things and keep the house clean and stay active as she possibly can.  She is been having a lot of pain down her legs as well as in her feet in particular.  She was supposed to see podiatry but had to cancel her appointment has had difficulty rescheduling it.  He also feels like her legs are more stiff.  She has had episodes like this in the past for years since I really known her.  One point she had a tentative diagnosis of MS and was following with Dr. at [facility] but because she still owes them some money she has not been back in the last couple of years.  For her anxiety and depression she did finally go see a therapist but said that she did not quite feel comfortable with the therapist.  She says she did schedule a second appointment but is not sure if she really wants to go back or not.  Past medical history, Surgical history, Family history not pertinant except as noted below, Social history, Allergies, and medications have been entered into the medical record, reviewed,  and corrections made.   Review of Systems: No fevers, chills, night sweats, weight loss, chest pain, or shortness of breath.   Objective:    General: Well Developed, well nourished, and in no acute distress.  Neuro: Alert and oriented x3, extra-ocular muscles intact, sensation grossly intact.  HEENT: Normocephalic, atraumatic  Skin: Warm and dry, no rashes. Cardiac: Regular rate and rhythm, no murmurs rubs or gallops, no lower extremity edema.  Respiratory: Clear to auscultation bilaterally. Not using accessory muscles, speaking in full sentences.   Impression and Recommendations:    HTN -pressure is mildly elevated today.  She was told to stick with the 50 mg metoprolol.  Just encouraged her to continue to work on hydration and staying as active as she can in reducing stress levels.  Short of breath-she actually did spirometry in April with asthma and allergy and had normal results.  She has clear lungs on exam today and had a normal chest x-ray.  Do not think that this is cardiac as it is not just with activity.  Bilat foot pain -try to get her rescheduled with podiatry.  She would likely benefit from nerve conduction studies as well so again we will try to get her back in with neurology.  Lower Extremity weakness with gait instability-today she is  using a cane.  We will try to get her back in with neurology.  Her sinus congestion and drainage-likely more allergic related I do not think she has a sinusitis that is bacterial at this point but if it continues, or she is not getting better, or she is suddenly worse than please give [place] call back.  Depression/anxiety-encouraged her to at least go see the therapist one more time and if she has any specific concerns to share those.  If not then we will be on the search for another therapist for her.  Time spent 40 minutes with greater than 50% of time spent counseling about blood pressure, shortness of breath, bilateral foot pain, lower  extremely weakness, sinus congestion and drainage.

## 2018-03-20 NOTE — Patient Instructions (Signed)
Place referral back to Dr. Lawrence Marseilles.

## 2018-03-21 ENCOUNTER — Ambulatory Visit (INDEPENDENT_AMBULATORY_CARE_PROVIDER_SITE_OTHER): Payer: Medicare HMO | Admitting: Family Medicine

## 2018-03-21 ENCOUNTER — Encounter: Payer: Self-pay | Admitting: Family Medicine

## 2018-03-21 VITALS — BP 149/78 | HR 76

## 2018-03-21 VITALS — BP 160/90 | HR 96 | Temp 98.8°F | Resp 22 | Ht 61.5 in | Wt 219.0 lb

## 2018-03-21 DIAGNOSIS — L309 Dermatitis, unspecified: Secondary | ICD-10-CM

## 2018-03-21 DIAGNOSIS — I10 Essential (primary) hypertension: Secondary | ICD-10-CM | POA: Diagnosis not present

## 2018-03-21 DIAGNOSIS — J029 Acute pharyngitis, unspecified: Secondary | ICD-10-CM

## 2018-03-21 DIAGNOSIS — J3089 Other allergic rhinitis: Secondary | ICD-10-CM | POA: Diagnosis not present

## 2018-03-21 DIAGNOSIS — R0982 Postnasal drip: Secondary | ICD-10-CM | POA: Diagnosis not present

## 2018-03-21 DIAGNOSIS — L710 Perioral dermatitis: Secondary | ICD-10-CM | POA: Diagnosis not present

## 2018-03-21 DIAGNOSIS — Z79899 Other long term (current) drug therapy: Secondary | ICD-10-CM | POA: Diagnosis not present

## 2018-03-21 DIAGNOSIS — J4530 Mild persistent asthma, uncomplicated: Secondary | ICD-10-CM

## 2018-03-21 DIAGNOSIS — J3489 Other specified disorders of nose and nasal sinuses: Secondary | ICD-10-CM | POA: Diagnosis not present

## 2018-03-21 DIAGNOSIS — K130 Diseases of lips: Secondary | ICD-10-CM | POA: Diagnosis not present

## 2018-03-21 DIAGNOSIS — L282 Other prurigo: Secondary | ICD-10-CM | POA: Insufficient documentation

## 2018-03-21 LAB — PULMONARY FUNCTION TEST

## 2018-03-21 MED ORDER — LORATADINE 5 MG/5ML PO SYRP: 5.0000 mg | ORAL_SOLUTION | Freq: Every day | ORAL | 2 refills | Status: DC

## 2018-03-21 MED ORDER — TRIAMCINOLONE ACETONIDE 0.025 % EX OINT: 1.0000 "application " | TOPICAL_OINTMENT | Freq: Two times a day (BID) | CUTANEOUS | 0 refills | Status: DC

## 2018-03-21 NOTE — Patient Instructions (Signed)
Recommend try the claritin  - one a day.  Only use vaseline on the lips for now.  Ok to use the triamcinolone cream on the skin around the lips.
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
## 2018-03-21 NOTE — Patient Instructions (Addendum)
Xopenex 2 puffs every 6 hours if needed for cough or wheeze Begin ranitidine 150 mg twice a day to control reflux Continue Mucinex 1200 mg twice a day to thin post nasal drip Increase fluids as tolerated to thin secretions Begin Claritin 5 mg once a day Begin nasal saline rinses twice a day Call us if she is not doing well on this treatment plan. Begin a journal detailing medications, cosmetic products, or foods that exacerbate the rash on your lip.  Continue on your other medications as listed in the chart. Follow up for your blood pressure with your primary care provider. Your blood pressure was elevated for two separate readings at today's visit.  If you have an allergic reaction,  take Benadryl 50 mg every 4 hours and if you have life-threatening symptoms inject  with EpiPen 0.3 mg. Then write what you had to eat or drink in the previous 4 hours  Follow up in 6 months or sooner if needed

## 2018-03-21 NOTE — Progress Notes (Addendum)
[place] [address] Dept: [phone]  FOLLOW UP NOTE  Patient ID: Jennifer [name], female    DOB: [birth year]  Age: 52 y.o. MRN: [id number] Date of Office Visit: 03/21/2018  Assessment  Chief Complaint: Allergy Testing (itching/burning lips) and Shortness of Breath  HPI Jennifer [name] is a 52 year old female who presents to the clinic for an acute visit. She was last seen in the clinic on 11/19/2017 by Gareth Morgan, NP for evaluation of allergic rhinitis, asthma, and reflux. At that time, she reported her asthma as not well controlled with shortness of breath for which she had visited the emergency room the night before and received a prescription for an oral steroid.   At today's visit, she reports she has experienced a rash on her bottom lip with a burning sensation that began last night while she was sleeping. She reports that she looked at her lip in the mirror and then began to feel that her "throat was thick" and had trouble getting a breath out. She reports she calmed down enough to get back to sleep with no medical intervention. When she woke up in the morning she did not feel shortness of breath or gastrointestinal symptoms. The lesions are described as raised and red.  Individual lesions last about one day. She denies concomitant angioedema, cardiopulmonary symptoms and GI symptoms. She has not experienced unexpected weight loss, recurrent fevers or drenching night sweats. No specific medication, food, skin care product, detergent, soap, or other environmental triggers have been identified. She reports she may have eaten a food that she has developed an allergy to. She is suspicious of pistachio, peanut, milk, and any fruit in the melon family, however, she did not consume any of those foods last night. The symptoms do not seem to correlate with NSAIDs use or emotional stress. She did not have symptoms consistent with a respiratory tract infection at the time of symptom  onset. Jennifer [name] has tried to control symptoms with no medical intervention has been tried . She has not been evaluated and treated in the emergency department for these symptoms, however, she did visit her primary care doctor earlier today. Skin biopsy has not been performed. She denies fever, new joint or muscle pains.   She reports feeling nasal congestion, thick post nasal drainage, and increased throat clearing over the last 3 weeks. She occasionally uses medicated nasal sprays and is not currently using nasal saline rinses. She has recently visited a provider's office over the last few days and received a prescription for Doxycycline, which she has not started at this time.  Her current medications are listed in the chart.   Drug Allergies:  Allergies  Allergen Reactions  . Ciprofloxacin Swelling    REACTION: tongue swells  . Milk-Related Compounds Swelling    Throat feels tight  . Mushroom Extract Complex Other (See Comments) and Anaphylaxis    Didn't feel right Per allergist do not take  . Sulfa Antibiotics Rash, Other (See Comments) and Shortness Of Breath    Other reaction(s): SHORTNESS OF BREATH Other reaction(s): SHORTNESS OF BREATH  Other reaction(s): SHORTNESS OF BREATH  . Telmisartan Swelling    Tongue swelling, Micardis  . Ace Inhibitors Cough  . Aspirin Hives and Other (See Comments)    flushing flushing  . Avelox [Moxifloxacin Hcl In Nacl] Itching       . Azithromycin     Lip swelling, SOB.     . Beta Adrenergic Blockers Other (  See Comments)    Feels like chest tightening labetalol, bystolic  Feels like chest tightening "Metoprolol"   . Buspar [Buspirone] Other (See Comments)    Light headed  . Butorphanol Tartrate Other (See Comments)    Patient aggitated  . Cetirizine Hives and Rash       . Clonidine Hcl     REACTION: makes blood pressure high  . Codeine Other (See Comments)    Feels sob  . Cortisone     Feels like she is going crazy  . Erythromycin  Rash  . Fentanyl Other (See Comments)    aggressive   . Fluoxetine Hcl Other (See Comments)    REACTION: headaches  . Ketorolac Tromethamine     jittery  . Lidocaine Other (See Comments)    When it involves the throat,   . Lisinopril Cough    Other reaction(s): Cough REACTION: cough REACTION: cough  . Metoclopramide Hcl Other (See Comments)    Dystonic reaction  . Montelukast Other (See Comments)    Singulair  . Montelukast Sodium Other (See Comments)    Unknown"Singulair" Don't remember  . Naproxen Other (See Comments)    FLUSHING  . Paroxetine Other (See Comments)    REACTION: headaches  . Penicillins Rash  . Pravastatin Other (See Comments)    Myalgias Myalgias  . Promethazine Other (See Comments)    Dystonic reaction  . Promethazine Hcl Other (See Comments)    jittery  . Quinolones Swelling and Rash  . Serotonin Reuptake Inhibitors (Ssris) Other (See Comments)    Headache Effexor, prozac, zoloft,   . Sertraline Hcl     REACTION: headaches  . Stelazine [Trifluoperazine] Other (See Comments)    Dystonic reaction  . Tobramycin Itching    itching , rash  . Trifluoperazine Hcl     dystonic  . Versed [Midazolam]     agitation  . Whey     Milk allergy  . Propoxyphene     Other reaction(s): Other (See Comments) Uncoded Allergy. Allergen: IRON IV, Other Reaction: Not Assessed Other reaction(s): Other (See Comments) Uncoded Allergy. Allergen: steriods, Other Reaction: Not Assessed  . Adhesive [Tape] Rash    EKG monitor patches, some tapes"reddnes,blisters"  . Butorphanol Anxiety    Patient agitated  . Ceftriaxone Rash    rocephin  . Erythromycin Base Itching and Rash  . Iron Rash    Flushing with certain IV types  . Metoclopramide Itching and Other (See Comments)    Dystonic reaction  . Metronidazole Rash  . Other Other (See Comments) and Rash    Other reaction(s): Other (See Comments) Uncoded Allergy. Allergen: IRON IV, Other Reaction: Not Assessed Other  reaction(s): Other (See Comments) Uncoded Allergy. Allergen: steriods, Other Reaction: Not Assessed Uncoded Allergy. Allergen: IRON IV, Other Reaction: Not Assessed Uncoded Allergy. Allergen: steriods, Other Reaction: Not Assessed Other reaction(s): Flushing (ALLERGY/intolerance), GI Upset (intolerance), Hypertension (intolerance), Increased Heart Rate (intolerance), Mental Status Changes (intolerance), Other (See Comments), Tachycardia / Palpitations(intolerance) Hospital gowns leave a rash. Anything sticky leaves a rash. Heart monitor tapes cause a very bad rash. Antiemetics makes jittery. Anti-nausea medication causes unknown reaction--PT can only take Zofran. All pain medication has unknown reaction. Antibiotics cause unknown reaction--except Levaquin. Steroids cause hives and redness.  . Prednisone Anxiety and Palpitations  . Prochlorperazine Anxiety    Compazine:  Dystonic reaction  . Venlafaxine Anxiety  . Zyrtec [Cetirizine Hcl] Rash    All over body    Physical Exam: BP (!) 160/90   Pulse  96   Temp 98.8 F (37.1 C) (Oral)   Resp (!) 22   Ht 5' 1.5" (1.562 m)   Wt 219 lb (99.3 kg)   LMP 06/25/2013   SpO2 97%   BMI 40.71 kg/m    Physical Exam  Constitutional: She is oriented to person, place, and time. She appears well-developed and well-nourished.  HENT:  Head: Normocephalic.  Right Ear: External ear normal.  Left Ear: External ear normal.  Mouth/Throat: Oropharynx is clear and moist.  Bilateral nares erythematous and edematous with clear nasal drainage noted. Pharynx normal. Ears normal. Eyes normal.   Eyes: Conjunctivae are normal.  Neck: Normal range of motion. Neck supple.  Cardiovascular: Normal rate, regular rhythm and normal heart sounds.  No murmur noted. S1S2 regular rate and rhythm.   Pulmonary/Chest: Effort normal and breath sounds normal.  Lungs clear to auscultation  Musculoskeletal: Normal range of motion.  Neurological: She is alert and  oriented to person, place, and time.  Skin: Skin is warm and dry.  One red raised area on the left side of her lower lip noted. No open areas and no drainage noted.  Vitals reviewed.  Diagnostics: FVC 2.67, FEV1 2.36. Predicted FVC 3.14, predicted FEV1 2.48. Spirometry is within the normal range.   Assessment and Plan: 1. Dermatitis   2. Mild persistent asthma without complication   3. Essential hypertension   4. Other allergic rhinitis   5. Current use of beta blocker     Meds ordered this encounter  Medications  . loratadine (CLARITIN) 5 MG/5ML syrup    Sig: Take 5 mLs (5 mg total) by mouth daily.    Dispense:  180 mL    Refill:  2    Patient Instructions   Xopenex 2 puffs every 6 hours if needed for cough or wheeze  Begin ranitidine 150 mg twice a day to control reflux  Continue Mucinex 1200 mg twice a day to thin post nasal drip  Increase fluids as tolerated to thin secretions  Begin Claritin 5 mg once a day  Begin nasal saline rinses twice a day  Call us if she is not doing well on this treatment plan. Begin a journal detailing medications, cosmetic products, or foods that exacerbate the rash on your lip.   Continue on your other medications as listed in the chart.  Follow up for your blood pressure with your primary care provider. Your blood pressure was elevated for two separate readings at today's visit.   If you have an allergic reaction,  take Benadryl 50 mg every 4 hours and if you have life-threatening symptoms inject  with EpiPen 0.3 mg. Then write what you had to eat or drink in the previous 4 hours  Follow up in 6 months or sooner if needed [facility] will call if her condition worsens or does not improve. She will begin a journal detailing medications, cosmetic products, or foods that exacerbate the rash on her lip.  Return in about 6 months (around 09/21/2018), or if symptoms worsen or fail to improve.   Thank you for the opportunity to care for this  patient.  Please do not hesitate to contact me with questions.  Gareth Morgan, FNP [facility]  _________________________________________________  I have provided oversight concerning Jennifer [name] Amb's evaluation and treatment of this patient's health issues addressed during today's encounter.  I agree with the assessment and therapeutic plan as outlined in the note.   Signed,   Fonnie Jarvis  Verlin Fester, MD

## 2018-03-28 ENCOUNTER — Ambulatory Visit: Payer: Self-pay | Admitting: Psychology

## 2018-03-28 ENCOUNTER — Ambulatory Visit: Payer: Medicare HMO | Admitting: Podiatry

## 2018-03-29 DIAGNOSIS — R69 Illness, unspecified: Secondary | ICD-10-CM | POA: Diagnosis not present

## 2018-04-02 DIAGNOSIS — I471 Supraventricular tachycardia: Secondary | ICD-10-CM | POA: Diagnosis not present

## 2018-04-02 DIAGNOSIS — G4733 Obstructive sleep apnea (adult) (pediatric): Secondary | ICD-10-CM | POA: Diagnosis not present

## 2018-04-02 DIAGNOSIS — I491 Atrial premature depolarization: Secondary | ICD-10-CM | POA: Diagnosis not present

## 2018-04-02 DIAGNOSIS — I493 Ventricular premature depolarization: Secondary | ICD-10-CM | POA: Diagnosis not present

## 2018-04-02 DIAGNOSIS — Z6841 Body Mass Index (BMI) 40.0 and over, adult: Secondary | ICD-10-CM | POA: Diagnosis not present

## 2018-04-02 DIAGNOSIS — I10 Essential (primary) hypertension: Secondary | ICD-10-CM | POA: Diagnosis not present

## 2018-04-02 DIAGNOSIS — R7303 Prediabetes: Secondary | ICD-10-CM | POA: Diagnosis not present

## 2018-04-04 ENCOUNTER — Other Ambulatory Visit: Payer: Self-pay | Admitting: *Deleted

## 2018-04-04 DIAGNOSIS — R202 Paresthesia of skin: Secondary | ICD-10-CM | POA: Diagnosis not present

## 2018-04-04 DIAGNOSIS — R51 Headache: Secondary | ICD-10-CM | POA: Diagnosis not present

## 2018-04-04 DIAGNOSIS — E876 Hypokalemia: Secondary | ICD-10-CM

## 2018-04-04 DIAGNOSIS — S0990XA Unspecified injury of head, initial encounter: Secondary | ICD-10-CM | POA: Diagnosis not present

## 2018-04-04 DIAGNOSIS — Z87891 Personal history of nicotine dependence: Secondary | ICD-10-CM | POA: Diagnosis not present

## 2018-04-04 DIAGNOSIS — J328 Other chronic sinusitis: Secondary | ICD-10-CM | POA: Diagnosis not present

## 2018-04-04 DIAGNOSIS — E119 Type 2 diabetes mellitus without complications: Secondary | ICD-10-CM | POA: Diagnosis not present

## 2018-04-04 DIAGNOSIS — Z886 Allergy status to analgesic agent status: Secondary | ICD-10-CM | POA: Diagnosis not present

## 2018-04-04 LAB — BASIC METABOLIC PANEL
BUN: 14 (ref 4–21)
Creatinine: 0.6 (ref 0.5–1.1)
Glucose: 93
Potassium: 4.2 (ref 3.4–5.3)
Sodium: 141 (ref 137–147)

## 2018-04-04 LAB — VITAMIN D 25 HYDROXY (VIT D DEFICIENCY, FRACTURES): Vit D, 25-Hydroxy: 9.1

## 2018-04-08 ENCOUNTER — Telehealth: Payer: Self-pay

## 2018-04-08 NOTE — Telephone Encounter (Signed)
LM for Jennifer [name] that the gyn clinic was returning her call to set up and appointment.  She can call back to the office  Tomorrow to schedule appointment.

## 2018-04-09 ENCOUNTER — Encounter: Payer: Self-pay | Admitting: Family Medicine

## 2018-04-09 ENCOUNTER — Ambulatory Visit (INDEPENDENT_AMBULATORY_CARE_PROVIDER_SITE_OTHER): Payer: Medicare HMO | Admitting: Family Medicine

## 2018-04-09 VITALS — BP 136/84 | HR 92 | Ht 62.0 in | Wt 221.0 lb

## 2018-04-09 DIAGNOSIS — Z1159 Encounter for screening for other viral diseases: Secondary | ICD-10-CM

## 2018-04-09 DIAGNOSIS — J309 Allergic rhinitis, unspecified: Secondary | ICD-10-CM | POA: Diagnosis not present

## 2018-04-09 DIAGNOSIS — R69 Illness, unspecified: Secondary | ICD-10-CM | POA: Diagnosis not present

## 2018-04-09 DIAGNOSIS — Z113 Encounter for screening for infections with a predominantly sexual mode of transmission: Secondary | ICD-10-CM | POA: Diagnosis not present

## 2018-04-09 DIAGNOSIS — G4700 Insomnia, unspecified: Secondary | ICD-10-CM | POA: Diagnosis not present

## 2018-04-09 DIAGNOSIS — Z114 Encounter for screening for human immunodeficiency virus [HIV]: Secondary | ICD-10-CM | POA: Diagnosis not present

## 2018-04-09 DIAGNOSIS — R6889 Other general symptoms and signs: Secondary | ICD-10-CM | POA: Diagnosis not present

## 2018-04-09 DIAGNOSIS — Z8639 Personal history of other endocrine, nutritional and metabolic disease: Secondary | ICD-10-CM

## 2018-04-09 DIAGNOSIS — Z119 Encounter for screening for infectious and parasitic diseases, unspecified: Secondary | ICD-10-CM | POA: Diagnosis not present

## 2018-04-09 MED ORDER — MOMETASONE FUROATE 50 MCG/ACT NA SUSP: 2.0000 | Freq: Every day | NASAL | 12 refills | Status: DC

## 2018-04-09 NOTE — Progress Notes (Signed)
Subjective:    CC: 2 week f/u  HPI:  51 year old female is here today for follow-up.  She did go to [facility] Emergency department on August 22 for pressure around her head forgetfulness and tingling.  Did have a CT of her sinuses which was negative for any type of lesion or sinus infection.  They had a head CT which was negative for mass, fluid, infarction etc.  Pressure was elevated at 181/111 when she was at the emergency department.  Hypertension- Pt denies chest pain, SOB, dizziness, or heart palpitations.  Taking meds as directed w/o problems.  Denies medication side effects.    She would like to have STD testing.    She is not sleeping well. She wonders if her iron is low again. She has been under a lot of stress.  Was up until 5Am last night feeling sleepy but restless.   She has been have some facial itching. Worse over her eyes and forehead but then feels like it spreads to her face.      Past medical history, Surgical history, Family history not pertinant except as noted below, Social history, Allergies, and medications have been entered into the medical record, reviewed, and corrections made.   Review of Systems: No fevers, chills, night sweats, weight loss, chest pain, or shortness of breath.   Objective:    General: Well Developed, well nourished, and in no acute distress.  Neuro: Alert and oriented x3, extra-ocular muscles intact, sensation grossly intact.  HEENT: Normocephalic, atraumatic  Skin: Warm and dry, no rashes. Cardiac: Regular rate and rhythm, no murmurs rubs or gallops, no lower extremity edema.  Respiratory: Clear to auscultation bilaterally. Not using accessory muscles, speaking in full sentences.   Impression and Recommendations:   Chronic sinus sxs. - has ENT appt on Thur at [place] to restart Nasonex and her Claritin as she is also getting facial itching.  CT was negative for infection. Nasal saline rinses not helping.    Facial itching -  trial fo oral anthistamine.  If not improving consider may be her MS. Also consider allergy to her make-up.  May want to take a break from   STD testing - labs ordered.    Insomnia  - will check iron levels.  Also recommend using sleep meditation therapy.    HTN - BP at goal today. Will continue to monitor.

## 2018-04-09 NOTE — Patient Instructions (Signed)
Please restart your claritin and Mometasone nasal spray.

## 2018-04-10 DIAGNOSIS — M7731 Calcaneal spur, right foot: Secondary | ICD-10-CM | POA: Diagnosis not present

## 2018-04-10 DIAGNOSIS — M722 Plantar fascial fibromatosis: Secondary | ICD-10-CM | POA: Diagnosis not present

## 2018-04-10 DIAGNOSIS — B351 Tinea unguium: Secondary | ICD-10-CM | POA: Diagnosis not present

## 2018-04-10 DIAGNOSIS — M79671 Pain in right foot: Secondary | ICD-10-CM | POA: Diagnosis not present

## 2018-04-10 DIAGNOSIS — M79672 Pain in left foot: Secondary | ICD-10-CM | POA: Diagnosis not present

## 2018-04-10 DIAGNOSIS — M7732 Calcaneal spur, left foot: Secondary | ICD-10-CM | POA: Diagnosis not present

## 2018-04-11 ENCOUNTER — Encounter: Payer: Self-pay | Admitting: Family Medicine

## 2018-04-11 DIAGNOSIS — J343 Hypertrophy of nasal turbinates: Secondary | ICD-10-CM | POA: Diagnosis not present

## 2018-04-11 DIAGNOSIS — J310 Chronic rhinitis: Secondary | ICD-10-CM | POA: Diagnosis not present

## 2018-04-11 DIAGNOSIS — G501 Atypical facial pain: Secondary | ICD-10-CM | POA: Diagnosis not present

## 2018-04-11 DIAGNOSIS — R490 Dysphonia: Secondary | ICD-10-CM | POA: Diagnosis not present

## 2018-04-11 DIAGNOSIS — R51 Headache: Secondary | ICD-10-CM | POA: Diagnosis not present

## 2018-04-12 DIAGNOSIS — R079 Chest pain, unspecified: Secondary | ICD-10-CM | POA: Diagnosis not present

## 2018-04-12 DIAGNOSIS — R1110 Vomiting, unspecified: Secondary | ICD-10-CM | POA: Diagnosis not present

## 2018-04-12 DIAGNOSIS — R072 Precordial pain: Secondary | ICD-10-CM | POA: Diagnosis not present

## 2018-04-12 DIAGNOSIS — I10 Essential (primary) hypertension: Secondary | ICD-10-CM | POA: Diagnosis not present

## 2018-04-12 DIAGNOSIS — R05 Cough: Secondary | ICD-10-CM | POA: Diagnosis not present

## 2018-04-12 DIAGNOSIS — R109 Unspecified abdominal pain: Secondary | ICD-10-CM | POA: Diagnosis not present

## 2018-04-12 DIAGNOSIS — R61 Generalized hyperhidrosis: Secondary | ICD-10-CM | POA: Diagnosis not present

## 2018-04-12 NOTE — Telephone Encounter (Signed)
Pt scheduled to see Dr. Gerarda Fraction on 04-17-18 Pt verbalized understanding.

## 2018-04-13 IMAGING — DX DG CHEST 2V
2 series · 2 of 2 positions shown · non-contrast
Comparison: 12/16/2016

CLINICAL DATA: Left-sided chest pain with dyspnea and this fascia
after a swallowing study today.

EXAM:
CHEST  2 VIEW

[chest pa]
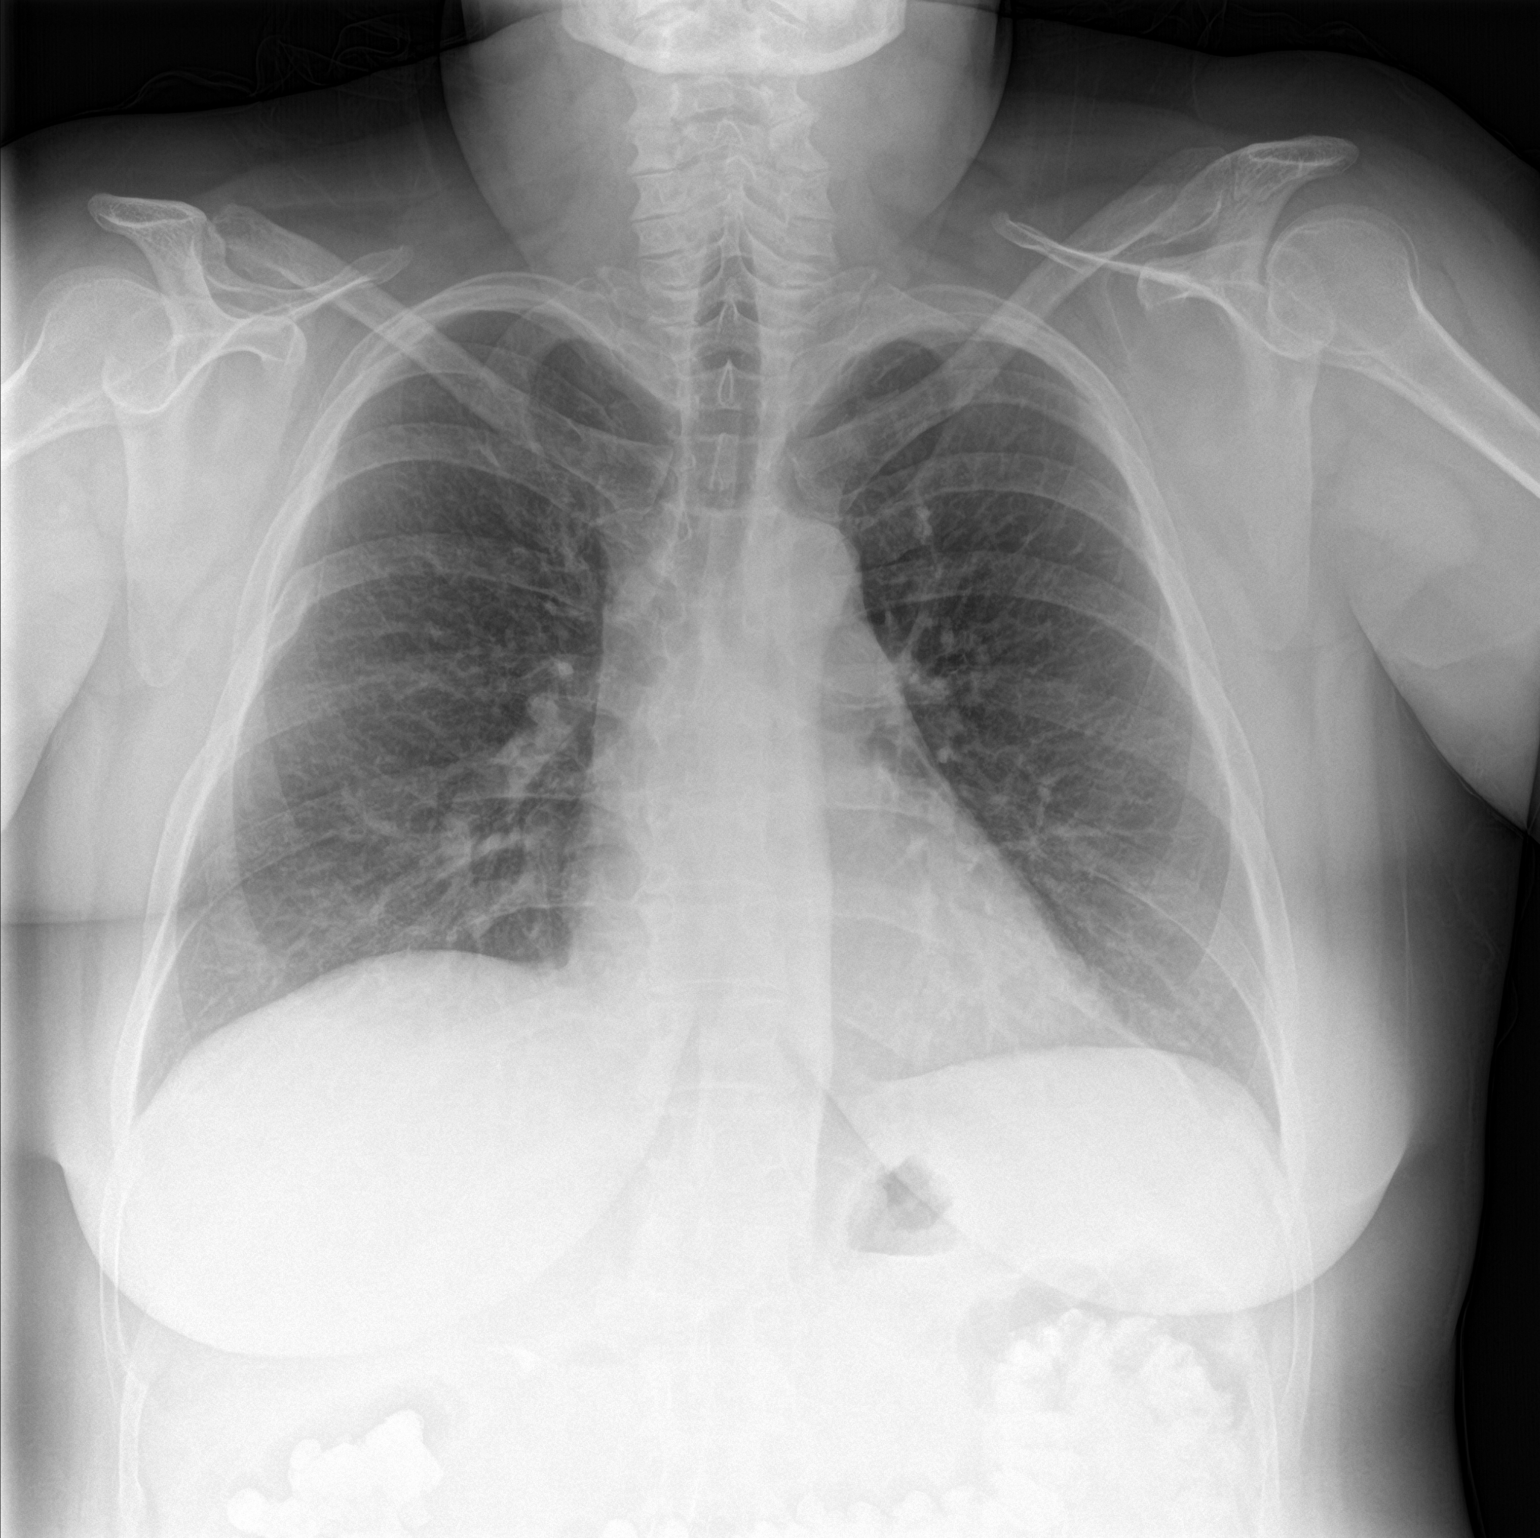

[chest lat]
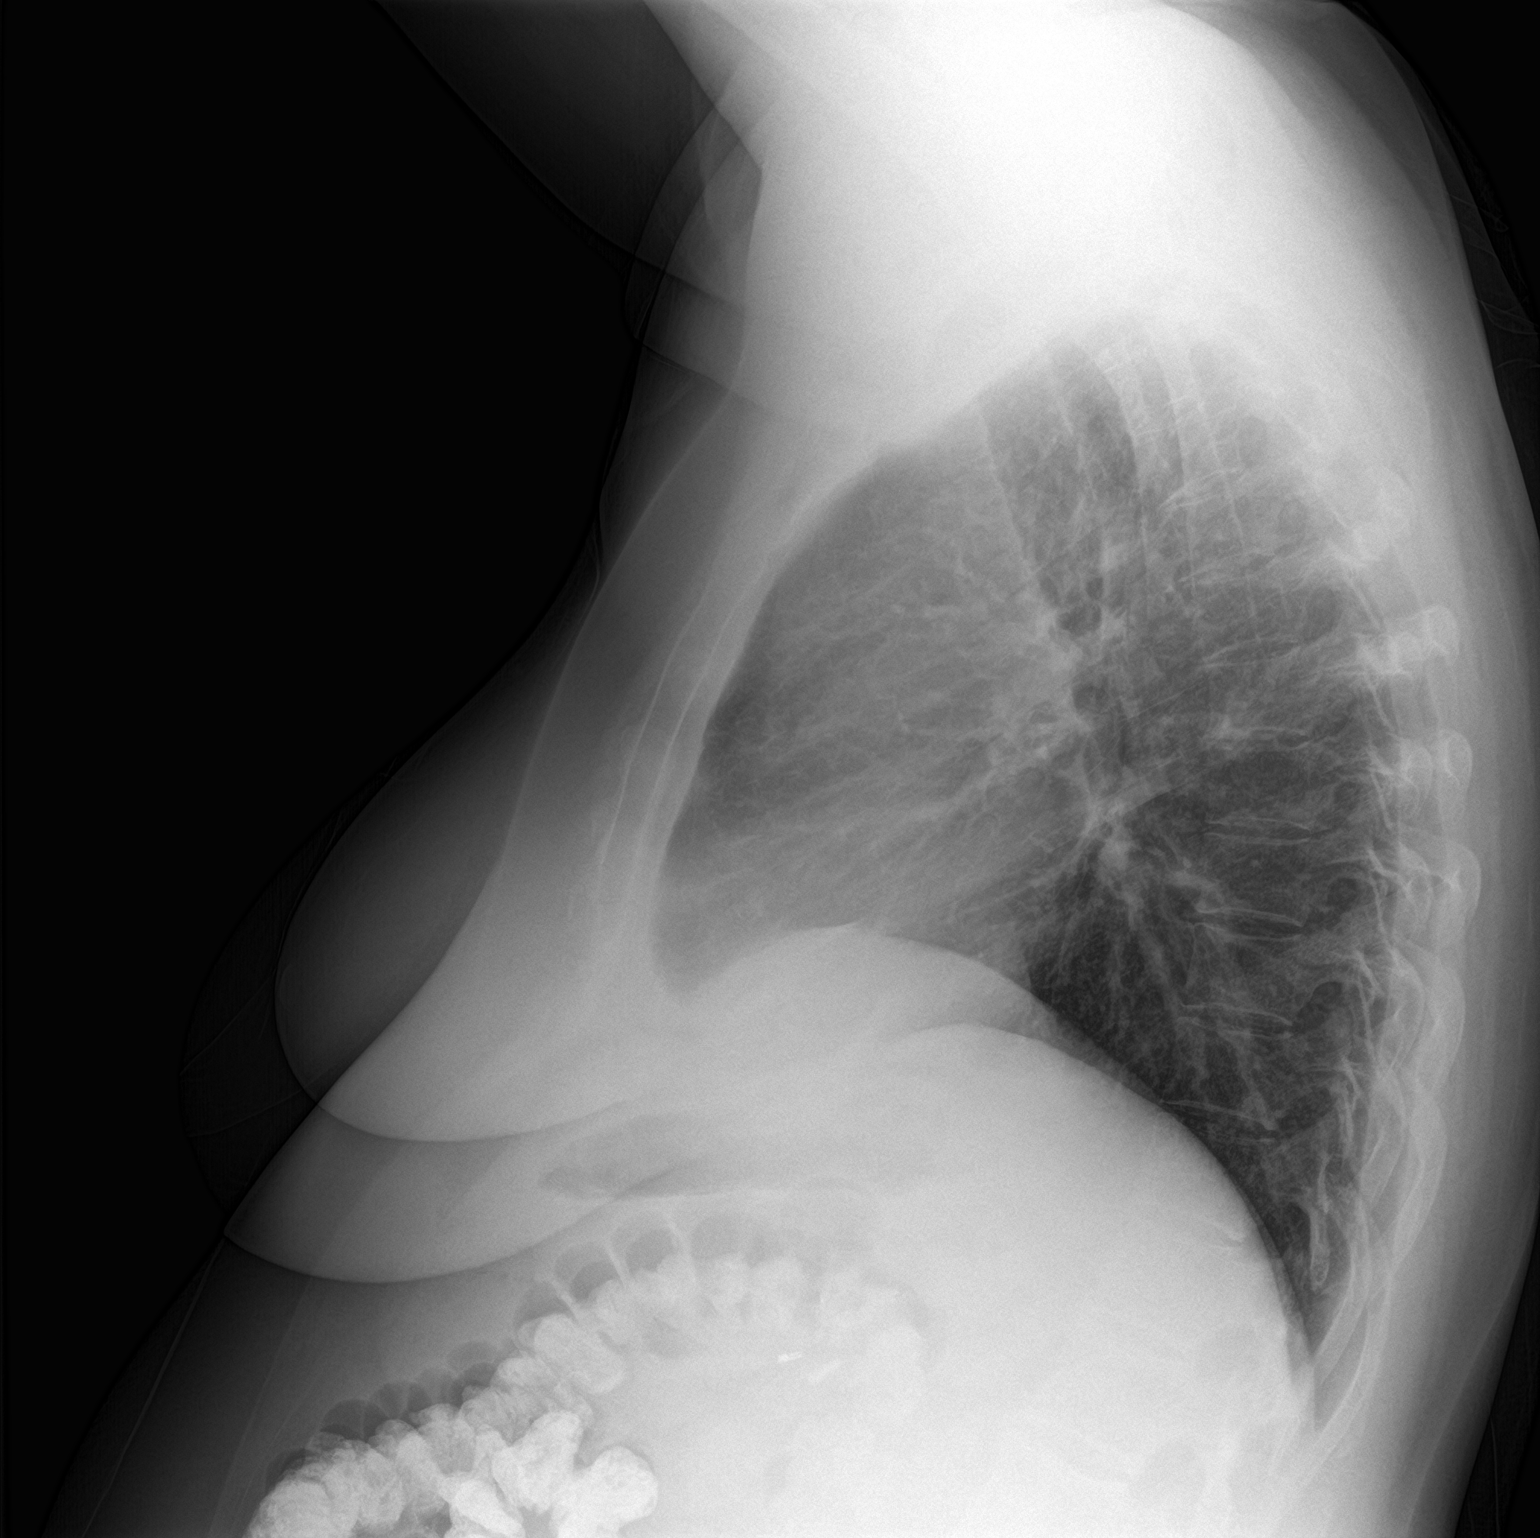

[2 of 2 positions shown; findings below may reference images not displayed]

FINDINGS: The heart size and mediastinal contours are within normal limits.
Both lungs are clear. No evidence of aspiration. The visualized
skeletal structures are unremarkable. Enteric contrast is seen
within large bowel.
IMPRESSION: No active cardiopulmonary disease.

## 2018-04-17 ENCOUNTER — Other Ambulatory Visit (HOSPITAL_COMMUNITY)
Admission: RE | Admit: 2018-04-17 | Discharge: 2018-04-17 | Disposition: A | Discharge status: Home / Self Care | Payer: Medicare HMO | Source: Ambulatory Visit | Attending: Obstetrics | Admitting: Obstetrics

## 2018-04-17 ENCOUNTER — Encounter: Payer: Self-pay | Admitting: Obstetrics

## 2018-04-17 ENCOUNTER — Inpatient Hospital Stay: Payer: Medicare HMO

## 2018-04-17 ENCOUNTER — Inpatient Hospital Stay: Payer: Medicare HMO | Attending: Obstetrics | Admitting: Obstetrics

## 2018-04-17 VITALS — BP 148/83 | HR 65 | Temp 98.4°F | Resp 19 | Ht 62.0 in | Wt 223.9 lb

## 2018-04-17 DIAGNOSIS — Z90710 Acquired absence of both cervix and uterus: Secondary | ICD-10-CM

## 2018-04-17 DIAGNOSIS — Z90722 Acquired absence of ovaries, bilateral: Secondary | ICD-10-CM

## 2018-04-17 DIAGNOSIS — Z8542 Personal history of malignant neoplasm of other parts of uterus: Secondary | ICD-10-CM

## 2018-04-17 DIAGNOSIS — M25559 Pain in unspecified hip: Secondary | ICD-10-CM

## 2018-04-17 DIAGNOSIS — C541 Malignant neoplasm of endometrium: Secondary | ICD-10-CM

## 2018-04-17 DIAGNOSIS — R350 Frequency of micturition: Secondary | ICD-10-CM

## 2018-04-17 DIAGNOSIS — Z08 Encounter for follow-up examination after completed treatment for malignant neoplasm: Secondary | ICD-10-CM | POA: Diagnosis not present

## 2018-04-17 LAB — URINALYSIS, COMPLETE (UACMP) WITH MICROSCOPIC
Bilirubin Urine: NEGATIVE
Glucose, UA: NEGATIVE mg/dL
Hgb urine dipstick: NEGATIVE
Ketones, ur: NEGATIVE mg/dL
Leukocytes, UA: NEGATIVE
Nitrite: NEGATIVE
Protein, ur: NEGATIVE mg/dL
Specific Gravity, Urine: 1.021 (ref 1.005–1.030)
pH: 5 (ref 5.0–8.0)

## 2018-04-17 NOTE — Patient Instructions (Addendum)
1. A pap was done and you will be called with results 2. Return in 6 months for another pelvic.  Call in January of 2020 to schedule appointment. With Dr. Gerarda Fraction. [phone]. 3. An ultrasound and urine test will be ordered due to your pelvic pain 4. 

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
## 2018-04-18 ENCOUNTER — Telehealth: Payer: Self-pay

## 2018-04-18 DIAGNOSIS — D72829 Elevated white blood cell count, unspecified: Secondary | ICD-10-CM

## 2018-04-18 LAB — URINE CULTURE: Culture: NO GROWTH

## 2018-04-18 NOTE — Telephone Encounter (Signed)
Jennifer [name] would like an order for CBC due to her elevated WBC count.   [facility]  Fax [phone]

## 2018-04-18 NOTE — Telephone Encounter (Signed)
Order placed and faxed

## 2018-04-18 NOTE — Telephone Encounter (Signed)
OK to order CBC with diff and fax over.

## 2018-04-20 ENCOUNTER — Other Ambulatory Visit: Payer: Self-pay

## 2018-04-20 ENCOUNTER — Emergency Department (HOSPITAL_BASED_OUTPATIENT_CLINIC_OR_DEPARTMENT_OTHER)
Admission: EM | Admit: 2018-04-20 | Discharge: 2018-04-20 | Disposition: A | Discharge status: Home / Self Care | Payer: Medicare HMO | Attending: Emergency Medicine | Admitting: Emergency Medicine

## 2018-04-20 ENCOUNTER — Emergency Department (HOSPITAL_BASED_OUTPATIENT_CLINIC_OR_DEPARTMENT_OTHER): Payer: Medicare HMO

## 2018-04-20 ENCOUNTER — Encounter (HOSPITAL_BASED_OUTPATIENT_CLINIC_OR_DEPARTMENT_OTHER): Payer: Self-pay | Admitting: *Deleted

## 2018-04-20 DIAGNOSIS — R0602 Shortness of breath: Secondary | ICD-10-CM | POA: Diagnosis not present

## 2018-04-20 DIAGNOSIS — Z8541 Personal history of malignant neoplasm of cervix uteri: Secondary | ICD-10-CM | POA: Insufficient documentation

## 2018-04-20 DIAGNOSIS — Z87891 Personal history of nicotine dependence: Secondary | ICD-10-CM | POA: Diagnosis not present

## 2018-04-20 DIAGNOSIS — Z8542 Personal history of malignant neoplasm of other parts of uterus: Secondary | ICD-10-CM | POA: Diagnosis not present

## 2018-04-20 DIAGNOSIS — R0789 Other chest pain: Secondary | ICD-10-CM | POA: Diagnosis not present

## 2018-04-20 DIAGNOSIS — Z79899 Other long term (current) drug therapy: Secondary | ICD-10-CM | POA: Insufficient documentation

## 2018-04-20 DIAGNOSIS — Z90710 Acquired absence of both cervix and uterus: Secondary | ICD-10-CM | POA: Diagnosis not present

## 2018-04-20 DIAGNOSIS — R079 Chest pain, unspecified: Secondary | ICD-10-CM | POA: Diagnosis not present

## 2018-04-20 DIAGNOSIS — R9431 Abnormal electrocardiogram [ECG] [EKG]: Secondary | ICD-10-CM | POA: Diagnosis not present

## 2018-04-20 DIAGNOSIS — J45909 Unspecified asthma, uncomplicated: Secondary | ICD-10-CM | POA: Insufficient documentation

## 2018-04-20 DIAGNOSIS — Z08 Encounter for follow-up examination after completed treatment for malignant neoplasm: Secondary | ICD-10-CM | POA: Diagnosis not present

## 2018-04-20 DIAGNOSIS — R350 Frequency of micturition: Secondary | ICD-10-CM | POA: Diagnosis not present

## 2018-04-20 DIAGNOSIS — I10 Essential (primary) hypertension: Secondary | ICD-10-CM | POA: Insufficient documentation

## 2018-04-20 LAB — BASIC METABOLIC PANEL
Anion gap: 9 (ref 5–15)
BUN: 16 mg/dL (ref 6–20)
CO2: 25 mmol/L (ref 22–32)
Calcium: 8.8 mg/dL — ABNORMAL LOW (ref 8.9–10.3)
Chloride: 109 mmol/L (ref 98–111)
Creatinine, Ser: 0.5 mg/dL (ref 0.44–1.00)
GFR calc Af Amer: >60 mL/min (ref >60)
GFR calc non Af Amer: >60 mL/min (ref >60)
Glucose, Bld: 76 mg/dL (ref 70–99)
Potassium: 3.7 mmol/L (ref 3.5–5.1)
Sodium: 143 mmol/L (ref 135–145)

## 2018-04-20 LAB — CBC
HCT: 42.1 % (ref 36.0–46.0)
Hemoglobin: 13.6 g/dL (ref 12.0–15.0)
MCH: 29.1 pg (ref 26.0–34.0)
MCHC: 32.3 g/dL (ref 30.0–36.0)
MCV: 90.1 fL (ref 78.0–100.0)
Platelets: 193 10*3/uL (ref 150–400)
RBC: 4.67 MIL/uL (ref 3.87–5.11)
RDW: 14.6 % (ref 11.5–15.5)
WBC: 8.1 10*3/uL (ref 4.0–10.5)

## 2018-04-20 LAB — TROPONIN I: Troponin I: <0.03 ng/mL (ref <0.03)

## 2018-04-20 MED ORDER — IBUPROFEN 400 MG PO TABS
600.0000 mg | ORAL_TABLET | Freq: Once | ORAL | Status: AC
  Administered 2018-04-20: 600 mg via ORAL
  Filled 2018-04-20: qty 1

## 2018-04-20 NOTE — Discharge Instructions (Signed)
Please read and follow all provided instructions.  Your diagnoses today include:  1. Atypical chest pain     Tests performed today include:  An EKG of your heart  A chest x-ray  Cardiac enzymes - a blood test for heart muscle damage  Blood counts and electrolytes  Vital signs. See below for your results today.   Medications prescribed:   None  Take any prescribed medications only as directed.  Follow-up instructions: Please follow-up with your primary care provider as soon as you can for further evaluation of your symptoms.   Return instructions:  SEEK IMMEDIATE MEDICAL ATTENTION IF:  You have severe chest pain, especially if the pain is crushing or pressure-like and spreads to the arms, back, neck, or jaw, or if you have sweating, nausea (feeling sick to your stomach), or shortness of breath. THIS IS AN EMERGENCY. Don't wait to see if the pain will go away. Get medical help at once. Call 911 or 0 (operator). DO NOT drive yourself to the hospital.   Your chest pain gets worse and does not go away with rest.   You have an attack of chest pain lasting longer than usual, despite rest and treatment with the medications your caregiver has prescribed.   You wake from sleep with chest pain or shortness of breath.  You feel dizzy or faint.  You have chest pain not typical of your usual pain for which you originally saw your caregiver.   You have any other emergent concerns regarding your health.  Additional Information: Chest pain comes from many different causes. Your caregiver has diagnosed you as having chest pain that is not specific for one problem, but does not require admission.  You are at low risk for an acute heart condition or other serious illness.   Your vital signs today were: BP (!) 149/86 (BP Location: Left Arm)    Pulse 89    Temp 98.7 F (37.1 C) (Oral)    Resp 20    Ht 5\' 2"  (1.575 m)    Wt 101.6 kg    LMP 06/25/2013    SpO2 99%    BMI 40.95 kg/m  If your  blood pressure (BP) was elevated above 135/85 this visit, please have this repeated by your doctor within one month. --------------
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
## 2018-04-20 NOTE — ED Notes (Signed)
EKG given to Dr. Yao.  

## 2018-04-20 NOTE — ED Notes (Signed)
Pt/family verbalized understanding of discharge instructions.   

## 2018-04-20 NOTE — ED Triage Notes (Signed)
Pt reports chest pain and pressure since yesterday. States she is had pain n her left arm x 2 yesterday. C/o nausea. States she feels likes she is having trouble catching her breath

## 2018-04-22 ENCOUNTER — Ambulatory Visit (HOSPITAL_BASED_OUTPATIENT_CLINIC_OR_DEPARTMENT_OTHER): Payer: Medicare HMO

## 2018-04-22 DIAGNOSIS — R9431 Abnormal electrocardiogram [ECG] [EKG]: Secondary | ICD-10-CM | POA: Diagnosis not present

## 2018-04-22 DIAGNOSIS — R0789 Other chest pain: Secondary | ICD-10-CM | POA: Diagnosis not present

## 2018-04-22 DIAGNOSIS — R0602 Shortness of breath: Secondary | ICD-10-CM | POA: Diagnosis not present

## 2018-04-22 DIAGNOSIS — D72829 Elevated white blood cell count, unspecified: Secondary | ICD-10-CM | POA: Diagnosis not present

## 2018-04-22 DIAGNOSIS — E876 Hypokalemia: Secondary | ICD-10-CM | POA: Diagnosis not present

## 2018-04-24 ENCOUNTER — Ambulatory Visit (INDEPENDENT_AMBULATORY_CARE_PROVIDER_SITE_OTHER): Payer: Medicare HMO | Admitting: Family Medicine

## 2018-04-24 ENCOUNTER — Encounter: Payer: Self-pay | Admitting: Family Medicine

## 2018-04-24 ENCOUNTER — Telehealth: Payer: Self-pay | Admitting: *Deleted

## 2018-04-24 VITALS — BP 141/76 | HR 73 | Ht 62.0 in | Wt 224.0 lb

## 2018-04-24 DIAGNOSIS — E611 Iron deficiency: Secondary | ICD-10-CM | POA: Diagnosis not present

## 2018-04-24 DIAGNOSIS — R140 Abdominal distension (gaseous): Secondary | ICD-10-CM

## 2018-04-24 DIAGNOSIS — L918 Other hypertrophic disorders of the skin: Secondary | ICD-10-CM

## 2018-04-24 DIAGNOSIS — L821 Other seborrheic keratosis: Secondary | ICD-10-CM

## 2018-04-24 DIAGNOSIS — K5909 Other constipation: Secondary | ICD-10-CM

## 2018-04-24 DIAGNOSIS — D225 Melanocytic nevi of trunk: Secondary | ICD-10-CM

## 2018-04-24 MED ORDER — NYSTATIN 100000 UNIT/ML MT SUSP: 5.0000 mL | Freq: Four times a day (QID) | OROMUCOSAL | 0 refills | Status: DC

## 2018-04-24 NOTE — Telephone Encounter (Signed)
Called and spoke with the patient regarding her canceled scan. Patient stated that ashe will call back next week to schedule the scan.

## 2018-04-24 NOTE — Progress Notes (Signed)
Subjective:    CC: skin lesions  HPI:  Jennifer [name] is here today to have several skin lesions addressed.  She is mentioned it previously a office visit some I encouraged her to schedule a separate time to address it.  She has one lesion on her right upper back near the upper thoracic spine that her husband noticed.  It is not bothersome but it seems to have gotten larger and the color is changing.  No prior history of skin cancer.  She also has several skin tags around her neck that she would like removed.  She has a few on the axilla but they are fairly small right now.  She also has a lesion underneath the left side of her chin that is raised but it is not a skin tablets more hard that she would like removed.  Again it is not bothersome or painful but she says she constantly wants to pick at it.  She also has a skin lesion underneath her right breast that has been there for several years that is bothersome that she would like to have removed.  He continues to have problems with constipation abdominal pain and bloating.  She has been taking her lactulose 3 times a day but really not moving much stool.  She still having to strain.  Past medical history, Surgical history, Family history not pertinant except as noted below, Social history, Allergies, and medications have been entered into the medical record, reviewed, and corrections made.   Review of Systems: No fevers, chills, night sweats, weight loss, chest pain, or shortness of breath.   Objective:    General: Well Developed, well nourished, and in no acute distress.  Neuro: Alert and oriented x3, extra-ocular muscles intact, sensation grossly intact.  HEENT: Normocephalic, atraumatic  Skin: Warm and dry, no rashes. Cardiac: Regular rate and rhythm, no murmurs rubs or gallops, no lower extremity edema.  Respiratory: Clear to auscultation bilaterally. Not using accessory muscles, speaking in full sentences. Abd:, Diffusely tender, increased  tympany.   Impression and Recommendations:    Seborrheic keratoses under the left chin-cryotherapy performed.  Patient tolerated well.  Skin tags-5 removed from around the neck.  The ones around the axilla are very small and to work on a weight and remove those at another time.  Atypical nevus on right upper back-shave biopsy performed.  Sample sent to pathology for further evaluation.  Will call with results once available.  Chronic constipation/bloating/abdominal pain  -she does not do well with MiraLAX and really does not want to take stimulant laxative.  Shave Biopsy Procedure Note  Pre-operative Diagnosis: Dysplastic nevi  Post-operative Diagnosis: same  Locations:right Upper back  Indications: await pathology.    Anesthesia: Lidocaine 1% with epinephrine without added sodium bicarbonate  Procedure Details  Patient informed of the risks (including bleeding and infection) and benefits of the  procedure and Verbal informed consent obtained.  The lesion and surrounding area were given a sterile prep using chlorhexidine and draped in the usual sterile fashion. A scalpel was used to shave an area of skin approximately 0.8cm by 0.8cm.  Hemostasis achieved with alumuninum chloride. Antibiotic ointment and a sterile dressing applied.  The specimen was sent for pathologic examination. The patient tolerated the procedure well.  EBL: 0 ml  Findings:   Condition: Stable  Complications: none.  Plan: 1. Instructed to keep the wound dry and covered for 24-48h and clean thereafter. 2. Warning signs of infection were reviewed.   3. Recommended that the  patient use OTC acetaminophen as needed for pain.  4. Return PRN.   Cryotherapy Procedure Note  Pre-operative Diagnosis: seborheaic keratosis  Post-operative Diagnosis: same  Locations: left neck   Indications: irritation  Anesthesia: not required    Procedure Details  Patient informed of risks (permanent scarring,  infection, light or dark discoloration, bleeding, infection, weakness, numbness and recurrence of the lesion) and benefits of the procedure and verbal informed consent obtained.  The areas are treated with liquid nitrogen therapy, frozen until ice ball extended 1-2 mm beyond lesion, allowed to thaw, and treated again. The patient tolerated procedure well.  The patient was instructed on post-op care, warned that there may be blister formation, redness and pain. Recommend OTC analgesia as needed for pain.  Condition: Stable  Complications: none.  Plan: 1. Instructed to keep the area dry and covered for 24-48h and clean thereafter. 2. Warning signs of infection were reviewed.   3. Recommended that the patient use OTC acetaminophen as needed for pain.  4. Return pRN.

## 2018-04-24 NOTE — Patient Instructions (Signed)
Ok to increase Lactulose to 78ml 4 times a day until have a soft bowel movement. It may take 48 hours to see a good response.   You can also try aloe juice. This works well for some people.   Also can use your Magnesium citrate if needed.

## 2018-04-25 LAB — CYTOLOGY - PAP: Diagnosis: NEGATIVE

## 2018-04-30 DIAGNOSIS — R9431 Abnormal electrocardiogram [ECG] [EKG]: Secondary | ICD-10-CM | POA: Diagnosis not present

## 2018-04-30 DIAGNOSIS — R0981 Nasal congestion: Secondary | ICD-10-CM | POA: Diagnosis not present

## 2018-04-30 DIAGNOSIS — R0602 Shortness of breath: Secondary | ICD-10-CM | POA: Diagnosis not present

## 2018-04-30 DIAGNOSIS — R5383 Other fatigue: Secondary | ICD-10-CM | POA: Diagnosis not present

## 2018-05-01 DIAGNOSIS — R9431 Abnormal electrocardiogram [ECG] [EKG]: Secondary | ICD-10-CM | POA: Diagnosis not present

## 2018-05-03 ENCOUNTER — Other Ambulatory Visit: Payer: Self-pay

## 2018-05-03 ENCOUNTER — Encounter (HOSPITAL_BASED_OUTPATIENT_CLINIC_OR_DEPARTMENT_OTHER): Payer: Self-pay | Admitting: Adult Health

## 2018-05-03 ENCOUNTER — Other Ambulatory Visit: Payer: Self-pay | Admitting: Family Medicine

## 2018-05-03 ENCOUNTER — Emergency Department (HOSPITAL_BASED_OUTPATIENT_CLINIC_OR_DEPARTMENT_OTHER)
Admission: EM | Admit: 2018-05-03 | Discharge: 2018-05-03 | Disposition: A | Discharge status: Home / Self Care | Payer: Medicare HMO | Attending: Emergency Medicine | Admitting: Emergency Medicine

## 2018-05-03 DIAGNOSIS — J45909 Unspecified asthma, uncomplicated: Secondary | ICD-10-CM | POA: Diagnosis not present

## 2018-05-03 DIAGNOSIS — R030 Elevated blood-pressure reading, without diagnosis of hypertension: Secondary | ICD-10-CM | POA: Diagnosis not present

## 2018-05-03 DIAGNOSIS — R002 Palpitations: Secondary | ICD-10-CM | POA: Insufficient documentation

## 2018-05-03 DIAGNOSIS — Z87891 Personal history of nicotine dependence: Secondary | ICD-10-CM | POA: Insufficient documentation

## 2018-05-03 DIAGNOSIS — I10 Essential (primary) hypertension: Secondary | ICD-10-CM | POA: Diagnosis not present

## 2018-05-03 LAB — COMPREHENSIVE METABOLIC PANEL
ALT: 27 U/L (ref 0–44)
AST: 20 U/L (ref 15–41)
Albumin: 4 g/dL (ref 3.5–5.0)
Alkaline Phosphatase: 74 U/L (ref 38–126)
Anion gap: 7 (ref 5–15)
BUN: 16 mg/dL (ref 6–20)
CO2: 28 mmol/L (ref 22–32)
Calcium: 8.7 mg/dL — ABNORMAL LOW (ref 8.9–10.3)
Chloride: 110 mmol/L (ref 98–111)
Creatinine, Ser: 0.7 mg/dL (ref 0.44–1.00)
GFR calc Af Amer: >60 mL/min (ref >60)
GFR calc non Af Amer: >60 mL/min (ref >60)
Glucose, Bld: 127 mg/dL — ABNORMAL HIGH (ref 70–99)
Potassium: 3.7 mmol/L (ref 3.5–5.1)
Sodium: 145 mmol/L (ref 135–145)
Total Bilirubin: 0.4 mg/dL (ref 0.3–1.2)
Total Protein: 7.4 g/dL (ref 6.5–8.1)

## 2018-05-03 LAB — CBC WITH DIFFERENTIAL/PLATELET
Basophils Absolute: 0 10*3/uL (ref 0.0–0.1)
Basophils Relative: 1 %
Eosinophils Absolute: 0.1 10*3/uL (ref 0.0–0.7)
Eosinophils Relative: 2 %
HCT: 43.5 % (ref 36.0–46.0)
Hemoglobin: 14.3 g/dL (ref 12.0–15.0)
Lymphocytes Relative: 18 %
Lymphs Abs: 1.4 10*3/uL (ref 0.7–4.0)
MCH: 29.7 pg (ref 26.0–34.0)
MCHC: 32.9 g/dL (ref 30.0–36.0)
MCV: 90.2 fL (ref 78.0–100.0)
Monocytes Absolute: 0.7 10*3/uL (ref 0.1–1.0)
Monocytes Relative: 8 %
Neutro Abs: 5.6 10*3/uL (ref 1.7–7.7)
Neutrophils Relative %: 71 %
Platelets: 183 10*3/uL (ref 150–400)
RBC: 4.82 MIL/uL (ref 3.87–5.11)
RDW: 14.5 % (ref 11.5–15.5)
WBC: 7.8 10*3/uL (ref 4.0–10.5)

## 2018-05-03 MED ORDER — METOPROLOL TARTRATE 50 MG PO TABS: 50.0000 mg | ORAL_TABLET | Freq: Once | ORAL | Status: DC

## 2018-05-03 NOTE — ED Notes (Signed)
Pt. Here due to B/P and has mult complaints.  Pt. Said she fell asleep or "went out"  Unsure due to she talks a lot.  Said her face felt funky and numb but feels fine now.  Pt. Said she didn't lay down to take a nap.  Pt. Drove self here.

## 2018-05-03 NOTE — Discharge Instructions (Signed)
Please follow up with PCP as needed. If your symptoms worsen you may return to the ED for revaluation.

## 2018-05-03 NOTE — ED Notes (Signed)
Pt. Speaks clearly and plain with no break in speech.  Pt. Reports her blood pressure at home was normal and here her blood pressure was up.

## 2018-05-03 NOTE — ED Notes (Signed)
Pt verbalizes understanding of d/c instructions and denies any further needs at this time. 
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
## 2018-05-03 NOTE — ED Triage Notes (Addendum)
Presents tearful stating, "I dont know what happened, I was out, I woke up and and I don't know what happened. MY whole face feels funky and my stomach is hurting and no one will do anything. I am just not feeling right. If I move I just feel off. MY mouth is completely dry too. The last time I felt like this my BP was over 200. I feel bad. I don't know." PT is having difficulty figuring out when this started and difficulty explaining. SHe is very tearful and endorses feeling anxious.

## 2018-05-07 DIAGNOSIS — Z886 Allergy status to analgesic agent status: Secondary | ICD-10-CM | POA: Diagnosis not present

## 2018-05-07 DIAGNOSIS — E119 Type 2 diabetes mellitus without complications: Secondary | ICD-10-CM | POA: Diagnosis not present

## 2018-05-07 DIAGNOSIS — Z881 Allergy status to other antibiotic agents status: Secondary | ICD-10-CM | POA: Diagnosis not present

## 2018-05-07 DIAGNOSIS — Z87891 Personal history of nicotine dependence: Secondary | ICD-10-CM | POA: Diagnosis not present

## 2018-05-07 DIAGNOSIS — I10 Essential (primary) hypertension: Secondary | ICD-10-CM | POA: Diagnosis not present

## 2018-05-07 DIAGNOSIS — S2231XA Fracture of one rib, right side, initial encounter for closed fracture: Secondary | ICD-10-CM | POA: Diagnosis not present

## 2018-05-07 DIAGNOSIS — Z8669 Personal history of other diseases of the nervous system and sense organs: Secondary | ICD-10-CM | POA: Diagnosis not present

## 2018-05-07 DIAGNOSIS — G35 Multiple sclerosis: Secondary | ICD-10-CM | POA: Diagnosis not present

## 2018-05-07 DIAGNOSIS — Z885 Allergy status to narcotic agent status: Secondary | ICD-10-CM | POA: Diagnosis not present

## 2018-05-07 DIAGNOSIS — E785 Hyperlipidemia, unspecified: Secondary | ICD-10-CM | POA: Diagnosis not present

## 2018-05-07 DIAGNOSIS — R55 Syncope and collapse: Secondary | ICD-10-CM | POA: Diagnosis not present

## 2018-05-07 DIAGNOSIS — R002 Palpitations: Secondary | ICD-10-CM | POA: Diagnosis not present

## 2018-05-09 ENCOUNTER — Ambulatory Visit (INDEPENDENT_AMBULATORY_CARE_PROVIDER_SITE_OTHER): Payer: Medicare HMO | Admitting: Family Medicine

## 2018-05-09 ENCOUNTER — Encounter: Payer: Self-pay | Admitting: Family Medicine

## 2018-05-09 VITALS — BP 172/90 | HR 88 | Wt 224.0 lb

## 2018-05-09 DIAGNOSIS — H539 Unspecified visual disturbance: Secondary | ICD-10-CM | POA: Diagnosis not present

## 2018-05-09 DIAGNOSIS — E611 Iron deficiency: Secondary | ICD-10-CM | POA: Diagnosis not present

## 2018-05-09 DIAGNOSIS — R1030 Lower abdominal pain, unspecified: Secondary | ICD-10-CM

## 2018-05-09 DIAGNOSIS — R55 Syncope and collapse: Secondary | ICD-10-CM
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
## 2018-05-10 DIAGNOSIS — R1084 Generalized abdominal pain: Secondary | ICD-10-CM | POA: Diagnosis not present

## 2018-05-10 DIAGNOSIS — R112 Nausea with vomiting, unspecified: Secondary | ICD-10-CM | POA: Diagnosis not present

## 2018-05-10 DIAGNOSIS — R1110 Vomiting, unspecified: Secondary | ICD-10-CM | POA: Diagnosis not present

## 2018-05-10 DIAGNOSIS — K5900 Constipation, unspecified: Secondary | ICD-10-CM | POA: Diagnosis not present

## 2018-05-11 DIAGNOSIS — R1110 Vomiting, unspecified: Secondary | ICD-10-CM | POA: Diagnosis not present

## 2018-05-11 DIAGNOSIS — R1084 Generalized abdominal pain: Secondary | ICD-10-CM | POA: Diagnosis not present

## 2018-05-13 DIAGNOSIS — R1013 Epigastric pain: Secondary | ICD-10-CM | POA: Diagnosis not present

## 2018-05-14 DIAGNOSIS — M7989 Other specified soft tissue disorders: Secondary | ICD-10-CM | POA: Diagnosis not present

## 2018-05-14 DIAGNOSIS — M7531 Calcific tendinitis of right shoulder: Secondary | ICD-10-CM | POA: Diagnosis not present

## 2018-05-14 DIAGNOSIS — R000 Tachycardia, unspecified: Secondary | ICD-10-CM | POA: Diagnosis not present

## 2018-05-14 DIAGNOSIS — I491 Atrial premature depolarization: Secondary | ICD-10-CM | POA: Diagnosis not present

## 2018-05-14 DIAGNOSIS — I471 Supraventricular tachycardia: Secondary | ICD-10-CM | POA: Diagnosis not present

## 2018-05-14 DIAGNOSIS — M6790 Unspecified disorder of synovium and tendon, unspecified site: Secondary | ICD-10-CM | POA: Diagnosis not present

## 2018-05-14 DIAGNOSIS — M25511 Pain in right shoulder: Secondary | ICD-10-CM | POA: Diagnosis not present

## 2018-05-14 DIAGNOSIS — I493 Ventricular premature depolarization: Secondary | ICD-10-CM | POA: Diagnosis not present

## 2018-05-14 DIAGNOSIS — R1013 Epigastric pain: Secondary | ICD-10-CM | POA: Diagnosis not present

## 2018-05-14 DIAGNOSIS — M79601 Pain in right arm: Secondary | ICD-10-CM | POA: Diagnosis not present

## 2018-05-14 DIAGNOSIS — E611 Iron deficiency: Secondary | ICD-10-CM | POA: Diagnosis not present

## 2018-05-14 DIAGNOSIS — E876 Hypokalemia: Secondary | ICD-10-CM | POA: Diagnosis not present

## 2018-05-16 ENCOUNTER — Other Ambulatory Visit: Payer: Self-pay

## 2018-05-16 ENCOUNTER — Emergency Department (HOSPITAL_BASED_OUTPATIENT_CLINIC_OR_DEPARTMENT_OTHER): Payer: Medicare HMO

## 2018-05-16 ENCOUNTER — Encounter (HOSPITAL_BASED_OUTPATIENT_CLINIC_OR_DEPARTMENT_OTHER): Payer: Self-pay | Admitting: *Deleted

## 2018-05-16 ENCOUNTER — Emergency Department (HOSPITAL_BASED_OUTPATIENT_CLINIC_OR_DEPARTMENT_OTHER)
Admission: EM | Admit: 2018-05-16 | Discharge: 2018-05-16 | Disposition: A | Discharge status: Home / Self Care | Payer: Medicare HMO | Attending: Emergency Medicine | Admitting: Emergency Medicine

## 2018-05-16 DIAGNOSIS — R2231 Localized swelling, mass and lump, right upper limb: Secondary | ICD-10-CM | POA: Diagnosis not present

## 2018-05-16 DIAGNOSIS — Z87891 Personal history of nicotine dependence: Secondary | ICD-10-CM | POA: Insufficient documentation

## 2018-05-16 DIAGNOSIS — Z79899 Other long term (current) drug therapy: Secondary | ICD-10-CM | POA: Diagnosis not present

## 2018-05-16 DIAGNOSIS — J45909 Unspecified asthma, uncomplicated: Secondary | ICD-10-CM | POA: Diagnosis not present

## 2018-05-16 DIAGNOSIS — M25511 Pain in right shoulder: Secondary | ICD-10-CM | POA: Diagnosis present

## 2018-05-16 DIAGNOSIS — M7989 Other specified soft tissue disorders: Secondary | ICD-10-CM | POA: Diagnosis not present

## 2018-05-16 DIAGNOSIS — I10 Essential (primary) hypertension: Secondary | ICD-10-CM | POA: Diagnosis not present

## 2018-05-16 DIAGNOSIS — R600 Localized edema: Secondary | ICD-10-CM | POA: Diagnosis not present

## 2018-05-16 NOTE — ED Notes (Signed)
Returned from [facility]/S

## 2018-05-16 NOTE — ED Notes (Signed)
Awaiting US.

## 2018-05-16 NOTE — ED Notes (Signed)
ED Provider at bedside. 

## 2018-05-16 NOTE — ED Notes (Signed)
Given snack and soda  
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
## 2018-05-16 NOTE — ED Triage Notes (Signed)
Right shoulder pain and swelling for a week. She was evaluation at [facility] yesterday and told she would need to schedule a repeat doppler.

## 2018-05-16 NOTE — ED Notes (Signed)
Patient transported to Ultrasound 

## 2018-05-17 ENCOUNTER — Telehealth: Payer: Self-pay | Admitting: Family Medicine

## 2018-05-17 DIAGNOSIS — I491 Atrial premature depolarization: Secondary | ICD-10-CM | POA: Diagnosis not present

## 2018-05-17 DIAGNOSIS — I493 Ventricular premature depolarization: Secondary | ICD-10-CM | POA: Diagnosis not present

## 2018-05-17 DIAGNOSIS — I471 Supraventricular tachycardia: Secondary | ICD-10-CM | POA: Diagnosis not present

## 2018-05-17 DIAGNOSIS — R000 Tachycardia, unspecified: Secondary | ICD-10-CM | POA: Diagnosis not present

## 2018-05-17 NOTE — Telephone Encounter (Signed)
Call pt: Jennifer [name] which is a reflection of [employer] looks good.

## 2018-05-17 NOTE — Telephone Encounter (Signed)
Attempted to contact Pt, no answer.  

## 2018-05-21 ENCOUNTER — Ambulatory Visit: Payer: Self-pay | Admitting: Family Medicine

## 2018-05-21 DIAGNOSIS — R9431 Abnormal electrocardiogram [ECG] [EKG]: Secondary | ICD-10-CM | POA: Diagnosis not present

## 2018-05-21 DIAGNOSIS — R002 Palpitations: Secondary | ICD-10-CM | POA: Diagnosis not present

## 2018-05-21 DIAGNOSIS — R5383 Other fatigue: Secondary | ICD-10-CM | POA: Diagnosis not present

## 2018-05-21 DIAGNOSIS — R531 Weakness: Secondary | ICD-10-CM | POA: Diagnosis not present

## 2018-05-21 DIAGNOSIS — I471 Supraventricular tachycardia: Secondary | ICD-10-CM | POA: Diagnosis not present

## 2018-05-22 DIAGNOSIS — I471 Supraventricular tachycardia: Secondary | ICD-10-CM | POA: Diagnosis not present

## 2018-05-22 DIAGNOSIS — I10 Essential (primary) hypertension: Secondary | ICD-10-CM | POA: Diagnosis not present

## 2018-05-22 NOTE — Telephone Encounter (Signed)
Called pt, no answer.Jennifer [name], [name]

## 2018-05-22 NOTE — Telephone Encounter (Signed)
Will discuss w/pt at appt tomorrow.Elouise Munroe, [place]

## 2018-05-23 ENCOUNTER — Encounter: Payer: Self-pay | Admitting: Family Medicine

## 2018-05-23 ENCOUNTER — Ambulatory Visit (INDEPENDENT_AMBULATORY_CARE_PROVIDER_SITE_OTHER): Payer: Medicare HMO | Admitting: Family Medicine

## 2018-05-23 VITALS — BP 147/73 | HR 72 | Ht 62.0 in | Wt 223.0 lb

## 2018-05-23 VITALS — BP 151/91 | HR 69 | Ht 62.0 in | Wt 223.0 lb

## 2018-05-23 DIAGNOSIS — I10 Essential (primary) hypertension: Secondary | ICD-10-CM

## 2018-05-23 DIAGNOSIS — R7303 Prediabetes: Secondary | ICD-10-CM | POA: Diagnosis not present

## 2018-05-23 DIAGNOSIS — E531 Pyridoxine deficiency: Secondary | ICD-10-CM | POA: Diagnosis not present

## 2018-05-23 DIAGNOSIS — M722 Plantar fascial fibromatosis: Secondary | ICD-10-CM

## 2018-05-23 DIAGNOSIS — R0981 Nasal congestion: Secondary | ICD-10-CM | POA: Diagnosis not present

## 2018-05-23 DIAGNOSIS — M7989 Other specified soft tissue disorders: Secondary | ICD-10-CM

## 2018-05-23 LAB — POCT GLYCOSYLATED HEMOGLOBIN (HGB A1C): Hemoglobin A1C: 6.1 % — AB (ref 4.0–5.6)

## 2018-05-23 NOTE — Patient Instructions (Addendum)
Start a Multivitamin to improve your B6.   After 8 weeks on the multivitamin then we can recheck your level  See if Cardiology wants you to increase your metoprolol.    Call if sinuses not better after 5 days on the nasal spray.

## 2018-05-23 NOTE — Patient Instructions (Addendum)
Thank you for coming in today. Schedule orthotic visit in the near future.  Work on the heel exercises.  Remember to go from the up position to the down position.  Soak your heels in a shallow pan of ice water.    Plantar Fasciitis Plantar fasciitis is a painful foot condition that affects the heel. It occurs when the band of tissue that connects the toes to the heel bone (plantar fascia) becomes irritated. This can happen after exercising too much or doing other repetitive activities (overuse injury). The pain from plantar fasciitis can range from mild irritation to severe pain that makes it difficult for you to walk or move. The pain is usually worse in the morning or after you have been sitting or lying down for a while. What are the causes? This condition may be caused by:  Standing for long periods of time.  Wearing shoes that do not fit.  Doing high-impact activities, including running, aerobics, and ballet.  Being overweight.  Having an abnormal way of walking (gait).  Having tight calf muscles.  Having high arches in your feet.  Starting a new athletic activity.  What are the signs or symptoms? The main symptom of this condition is heel pain. Other symptoms include:  Pain that gets worse after activity or exercise.  Pain that is worse in the morning or after resting.  Pain that goes away after you walk for a few minutes.  How is this diagnosed? This condition may be diagnosed based on your signs and symptoms. Your health care provider will also do a physical exam to check for:  A tender area on the bottom of your foot.  A high arch in your foot.  Pain when you move your foot.  Difficulty moving your foot.  You may also need to have imaging studies to confirm the diagnosis. These can include:  X-rays.  Ultrasound.  MRI.  How is this treated? Treatment for plantar fasciitis depends on the severity of the condition. Your treatment may include:  Rest,  ice, and over-the-counter pain medicines to manage your pain.  Exercises to stretch your calves and your plantar fascia.  A splint that holds your foot in a stretched, upward position while you sleep (night splint).  Physical therapy to relieve symptoms and prevent problems in the future.  Cortisone injections to relieve severe pain.  Extracorporeal shock wave therapy (ESWT) to stimulate damaged plantar fascia with electrical impulses. It is often used as a last resort before surgery.  Surgery, if other treatments have not worked after 12 months.  Follow these instructions at home:  Take medicines only as directed by your health care provider.  Avoid activities that cause pain.  Roll the bottom of your foot over a bag of ice or a bottle of cold water. Do this for 20 minutes, 3-4 times a day.  Perform simple stretches as directed by your health care provider.  Try wearing athletic shoes with air-sole or gel-sole cushions or soft shoe inserts.  Wear a night splint while sleeping, if directed by your health care provider.  Keep all follow-up appointments with your health care provider. How is this prevented?  Do not perform exercises or activities that cause heel pain.  Consider finding low-impact activities if you continue to have problems.  Lose weight if you need to. The best way to prevent plantar fasciitis is to avoid the activities that aggravate your plantar fascia. Contact a health care provider if:  Your symptoms do not  go away after treatment with home care measures.  Your pain gets worse.  Your pain affects your ability to move or do your daily activities. This information is not intended to replace advice given to you by your health care provider. Make sure you discuss any questions you have with your health care provider. Document Released: 04/25/2001 Document Revised: 01/03/2016 Document Reviewed: 06/10/2014 Elsevier Interactive Patient Education  [employer].
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
## 2018-05-23 NOTE — Progress Notes (Signed)
Foot pain
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
## 2018-05-24 ENCOUNTER — Telehealth: Payer: Self-pay | Admitting: Family Medicine

## 2018-05-24 MED ORDER — CLONIDINE HCL 0.1 MG PO TABS: 0.1000 mg | ORAL_TABLET | Freq: Three times a day (TID) | ORAL | 3 refills | Status: DC

## 2018-05-24 NOTE — Telephone Encounter (Addendum)
error 

## 2018-05-24 NOTE — Telephone Encounter (Signed)
Pt called. Her bp is still elevated and she is wanting low dose Clonidine to take along with her Metoprolol(50 mg twice a day).Marland Kitchenshe was in contact with her Cardiologist and they recommended that her Primary care Doc manage her bp. She is hoping she will get clonidine added for the weekend.  Thanks!

## 2018-05-24 NOTE — Telephone Encounter (Signed)
Still waiting to hear back?

## 2018-05-24 NOTE — Telephone Encounter (Signed)
Rx sent a new rx for clonidine.

## 2018-05-26 DIAGNOSIS — I491 Atrial premature depolarization: Secondary | ICD-10-CM | POA: Diagnosis not present

## 2018-05-26 DIAGNOSIS — I471 Supraventricular tachycardia: Secondary | ICD-10-CM | POA: Diagnosis not present

## 2018-05-27 DIAGNOSIS — I493 Ventricular premature depolarization: Secondary | ICD-10-CM | POA: Diagnosis not present

## 2018-05-27 DIAGNOSIS — I471 Supraventricular tachycardia: Secondary | ICD-10-CM | POA: Diagnosis not present

## 2018-05-27 DIAGNOSIS — I491 Atrial premature depolarization: Secondary | ICD-10-CM | POA: Diagnosis not present

## 2018-05-27 DIAGNOSIS — R000 Tachycardia, unspecified: Secondary | ICD-10-CM | POA: Diagnosis not present

## 2018-05-27 NOTE — Telephone Encounter (Signed)
Thanks

## 2018-05-28 DIAGNOSIS — S199XXA Unspecified injury of neck, initial encounter: Secondary | ICD-10-CM | POA: Diagnosis not present

## 2018-05-28 DIAGNOSIS — M25512 Pain in left shoulder: Secondary | ICD-10-CM | POA: Diagnosis not present

## 2018-05-28 DIAGNOSIS — R0602 Shortness of breath: Secondary | ICD-10-CM | POA: Diagnosis not present

## 2018-05-28 DIAGNOSIS — M5134 Other intervertebral disc degeneration, thoracic region: Secondary | ICD-10-CM | POA: Diagnosis not present

## 2018-05-28 DIAGNOSIS — M546 Pain in thoracic spine: Secondary | ICD-10-CM | POA: Diagnosis not present

## 2018-05-28 DIAGNOSIS — S300XXA Contusion of lower back and pelvis, initial encounter: Secondary | ICD-10-CM | POA: Diagnosis not present

## 2018-05-28 DIAGNOSIS — S20229A Contusion of unspecified back wall of thorax, initial encounter: Secondary | ICD-10-CM | POA: Diagnosis not present

## 2018-05-28 DIAGNOSIS — W228XXA Striking against or struck by other objects, initial encounter: Secondary | ICD-10-CM | POA: Diagnosis not present

## 2018-05-28 DIAGNOSIS — R531 Weakness: Secondary | ICD-10-CM | POA: Diagnosis not present

## 2018-05-28 DIAGNOSIS — R9431 Abnormal electrocardiogram [ECG] [EKG]: Secondary | ICD-10-CM | POA: Diagnosis not present

## 2018-05-28 DIAGNOSIS — S3992XA Unspecified injury of lower back, initial encounter: Secondary | ICD-10-CM | POA: Diagnosis not present

## 2018-05-28 DIAGNOSIS — R609 Edema, unspecified: Secondary | ICD-10-CM | POA: Diagnosis not present

## 2018-05-28 DIAGNOSIS — M25511 Pain in right shoulder: Secondary | ICD-10-CM | POA: Diagnosis not present

## 2018-05-28 DIAGNOSIS — M545 Low back pain: Secondary | ICD-10-CM | POA: Diagnosis not present

## 2018-05-28 DIAGNOSIS — M542 Cervicalgia: Secondary | ICD-10-CM | POA: Diagnosis not present

## 2018-05-28 DIAGNOSIS — S299XXA Unspecified injury of thorax, initial encounter: Secondary | ICD-10-CM | POA: Diagnosis not present

## 2018-05-28 DIAGNOSIS — Y999 Unspecified external cause status: Secondary | ICD-10-CM | POA: Diagnosis not present

## 2018-05-28 LAB — BASIC METABOLIC PANEL
BUN: 15 (ref 4–21)
Creatinine: 0.6 (ref 0.5–1.1)
Glucose: 102
Potassium: 3.6 (ref 3.4–5.3)
Sodium: 142 (ref 137–147)

## 2018-05-29 ENCOUNTER — Ambulatory Visit (INDEPENDENT_AMBULATORY_CARE_PROVIDER_SITE_OTHER): Payer: Medicare HMO | Admitting: Family Medicine

## 2018-05-29 ENCOUNTER — Encounter: Payer: Self-pay | Admitting: Family Medicine

## 2018-05-29 VITALS — BP 157/97 | HR 93 | Temp 97.9°F | Wt 222.0 lb

## 2018-05-29 DIAGNOSIS — M722 Plantar fascial fibromatosis: Secondary | ICD-10-CM

## 2018-05-29 DIAGNOSIS — M7989 Other specified soft tissue disorders: Secondary | ICD-10-CM

## 2018-05-29 DIAGNOSIS — R110 Nausea: Secondary | ICD-10-CM

## 2018-05-29 MED ORDER — ONDANSETRON 4 MG PO TBDP: 4.0000 mg | ORAL_TABLET | Freq: Three times a day (TID) | ORAL | 0 refills | Status: DC | PRN

## 2018-05-29 NOTE — Patient Instructions (Signed)
Thank you for coming in today. Use the orthotics and continue the home exercises.  Recheck in 1 month.  Return sooner if needed.   If your belly pain worsens, or you have high fever, bad vomiting, blood in your stool or black tarry stool go to the Emergency Room.   Let me know if you have problems with the zofran.
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
## 2018-05-29 NOTE — Telephone Encounter (Signed)
Have we gotten the results back on the arterial duplex yet?  See where it was done yesterday but I do not see the results when I click on the link under care everywhere I have not seen these come into my in basket.  Just want to make sure that they were okay before we refer her to lymphedema clinic.  I do believe that there is one at [facility] which I guess is now on but awake so we can try to refer her there.

## 2018-05-30 ENCOUNTER — Ambulatory Visit: Payer: Self-pay | Admitting: Family Medicine

## 2018-05-30 NOTE — Telephone Encounter (Signed)
Let patient know that we did get a copy of the results today and they are normal which is very reassuring.  Okay to place referral to lymphedema clinic per her preference.

## 2018-05-30 NOTE — Telephone Encounter (Signed)
Copy of report requested, to be faxed today

## 2018-05-30 NOTE — Addendum Note (Signed)
Addended by: Huel Cote on: 05/30/2018 01:23 PM   Modules accepted: Orders

## 2018-06-04 ENCOUNTER — Encounter: Payer: Self-pay | Admitting: Family Medicine

## 2018-06-05 DIAGNOSIS — R51 Headache: Secondary | ICD-10-CM | POA: Diagnosis not present

## 2018-06-05 DIAGNOSIS — R42 Dizziness and giddiness: Secondary | ICD-10-CM | POA: Diagnosis not present

## 2018-06-05 DIAGNOSIS — R531 Weakness: Secondary | ICD-10-CM | POA: Diagnosis not present

## 2018-06-05 DIAGNOSIS — G4489 Other headache syndrome: Secondary | ICD-10-CM | POA: Diagnosis not present

## 2018-06-05 DIAGNOSIS — I10 Essential (primary) hypertension: Secondary | ICD-10-CM | POA: Diagnosis not present

## 2018-06-06 ENCOUNTER — Telehealth: Payer: Self-pay

## 2018-06-06 DIAGNOSIS — E119 Type 2 diabetes mellitus without complications: Secondary | ICD-10-CM | POA: Diagnosis not present

## 2018-06-06 DIAGNOSIS — Z886 Allergy status to analgesic agent status: Secondary | ICD-10-CM | POA: Diagnosis not present

## 2018-06-06 DIAGNOSIS — Z884 Allergy status to anesthetic agent status: Secondary | ICD-10-CM | POA: Diagnosis not present

## 2018-06-06 DIAGNOSIS — G35 Multiple sclerosis: Secondary | ICD-10-CM | POA: Diagnosis not present

## 2018-06-06 DIAGNOSIS — Z87891 Personal history of nicotine dependence: Secondary | ICD-10-CM | POA: Diagnosis not present

## 2018-06-06 DIAGNOSIS — I10 Essential (primary) hypertension: Secondary | ICD-10-CM | POA: Diagnosis not present

## 2018-06-06 DIAGNOSIS — I471 Supraventricular tachycardia: Secondary | ICD-10-CM | POA: Diagnosis not present

## 2018-06-06 DIAGNOSIS — R42 Dizziness and giddiness: Secondary | ICD-10-CM | POA: Diagnosis not present

## 2018-06-06 DIAGNOSIS — Z8542 Personal history of malignant neoplasm of other parts of uterus: Secondary | ICD-10-CM | POA: Diagnosis not present

## 2018-06-06 DIAGNOSIS — J45909 Unspecified asthma, uncomplicated: Secondary | ICD-10-CM | POA: Diagnosis not present

## 2018-06-06 DIAGNOSIS — R51 Headache: Secondary | ICD-10-CM | POA: Diagnosis not present

## 2018-06-06 NOTE — Telephone Encounter (Signed)
Michalene states her blood pressure at the ED today was 200/101. Her blood pressure is still elevated. She was released and advised to follow up with PCP. She is taking Metoprolol 50 mg bid and clonidine 0.1 mg TID. Please advise.

## 2018-06-06 NOTE — Telephone Encounter (Signed)
Patient stopped by the office. Her blood pressure was 143/76. She wants to follow up with Dr Madilyn Fireman next week. Advised her to call us if needed.

## 2018-06-06 NOTE — Telephone Encounter (Signed)
Reviewed, blood pressure systolic in the 803O.  Looks like they did not give her any antihypertensives but lab work, EKG, CT head were okay.  If she would like to try increasing the metoprolol to 100 mg twice daily and following up with PCP on Monday, nurse visit just for blood pressure check on Monday is okay if PCP does not have any appointments available

## 2018-06-07 ENCOUNTER — Encounter: Payer: Self-pay | Admitting: Family Medicine

## 2018-06-10 DIAGNOSIS — L02411 Cutaneous abscess of right axilla: Secondary | ICD-10-CM | POA: Diagnosis not present

## 2018-06-12 ENCOUNTER — Ambulatory Visit (INDEPENDENT_AMBULATORY_CARE_PROVIDER_SITE_OTHER): Payer: Medicare HMO | Admitting: Family Medicine

## 2018-06-12 ENCOUNTER — Encounter: Payer: Self-pay | Admitting: Family Medicine

## 2018-06-12 VITALS — BP 152/96 | HR 91 | Ht 62.21 in | Wt 220.0 lb

## 2018-06-12 DIAGNOSIS — X58XXXA Exposure to other specified factors, initial encounter: Secondary | ICD-10-CM | POA: Diagnosis not present

## 2018-06-12 DIAGNOSIS — R030 Elevated blood-pressure reading, without diagnosis of hypertension: Secondary | ICD-10-CM | POA: Diagnosis not present

## 2018-06-12 DIAGNOSIS — R5383 Other fatigue: Secondary | ICD-10-CM

## 2018-06-12 DIAGNOSIS — Y999 Unspecified external cause status: Secondary | ICD-10-CM | POA: Diagnosis not present

## 2018-06-12 DIAGNOSIS — L02419 Cutaneous abscess of limb, unspecified: Secondary | ICD-10-CM

## 2018-06-12 DIAGNOSIS — R110 Nausea: Secondary | ICD-10-CM | POA: Diagnosis not present

## 2018-06-12 DIAGNOSIS — S40811A Abrasion of right upper arm, initial encounter: Secondary | ICD-10-CM | POA: Diagnosis not present

## 2018-06-12 DIAGNOSIS — L03119 Cellulitis of unspecified part of limb: Secondary | ICD-10-CM | POA: Diagnosis not present

## 2018-06-12 DIAGNOSIS — Z87891 Personal history of nicotine dependence: Secondary | ICD-10-CM | POA: Diagnosis not present

## 2018-06-12 DIAGNOSIS — I160 Hypertensive urgency: Secondary | ICD-10-CM

## 2018-06-12 IMAGING — DX DG SHOULDER 2+V*R*
3 series · 3 of 3 positions shown · non-contrast
Comparison: None.

CLINICAL DATA: Right shoulder pain for 3 weeks.

EXAM:
RIGHT SHOULDER - 2+ VIEW

[shoulder grashey]
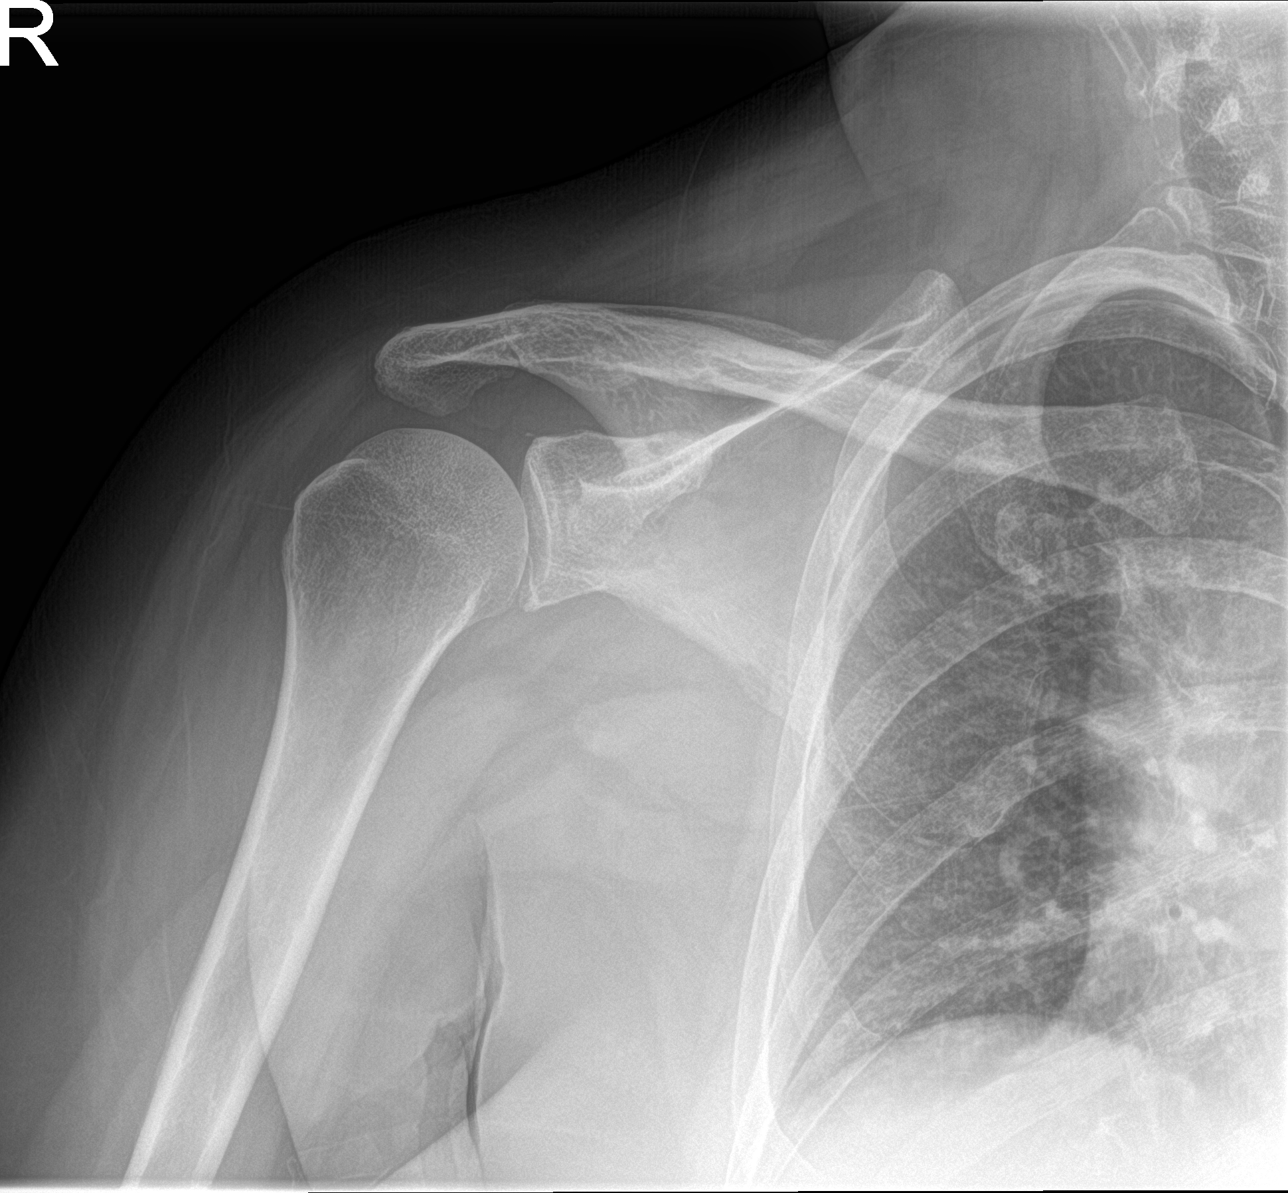

[shoulder y view]
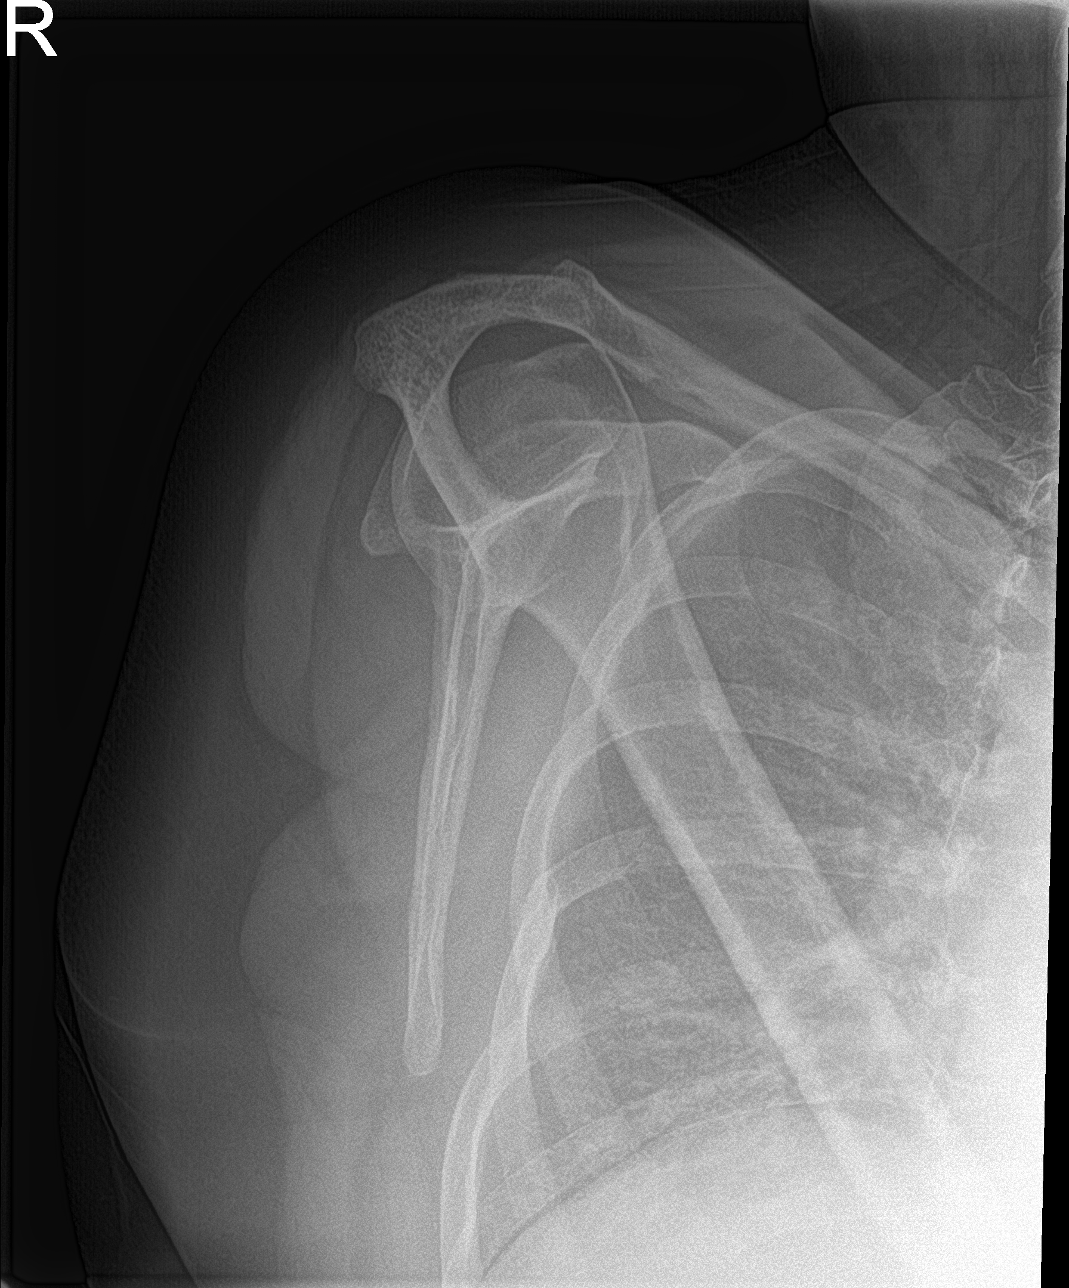

[shoulder axillary]
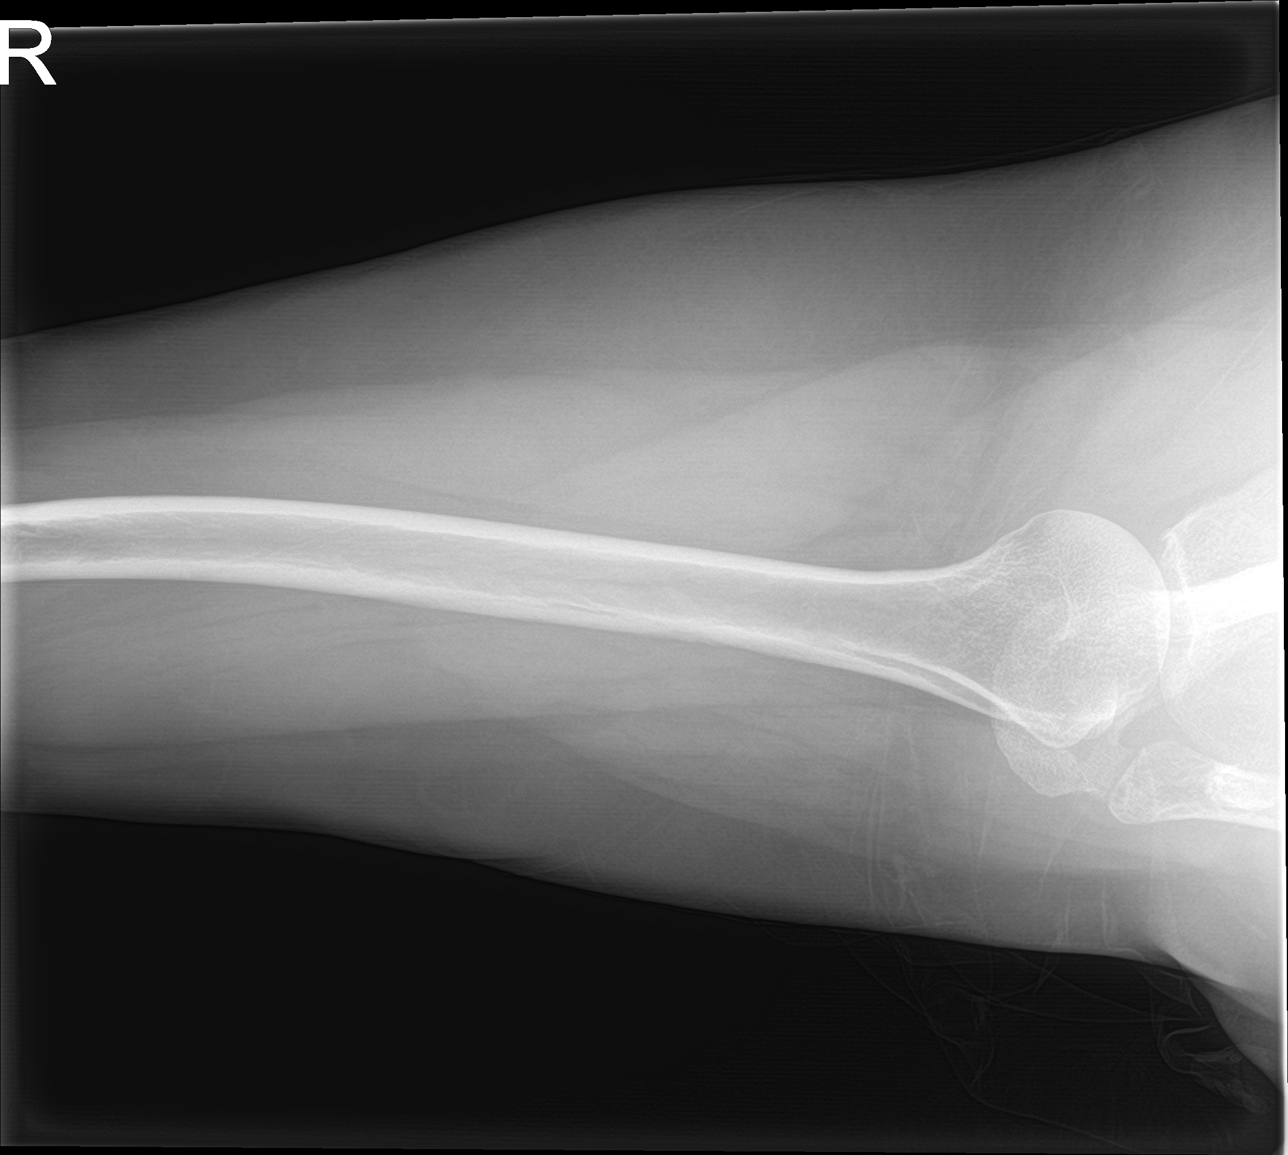

[3 of 3 positions shown; findings below may reference images not displayed]

FINDINGS: No significant degenerative changes or acute bony findings.
Mild/early glenohumeral joint degenerative changes with possible
chondrocalcinosis. No erosive changes. The visualized right lung is
grossly clear.
IMPRESSION: Mild/early glenohumeral joint degenerative changes with possible
chondrocalcinosis.

## 2018-06-12 NOTE — Progress Notes (Signed)
Subjective:    CC: ED F/U   HPI:  52 year old female is here today for follow-up for recent emergency department visits for elevated blood pressure.  She was seen on October 23 for weakness and then went back the following day on October 24 for elevated blood pressure.  At the time her systolic pressure was 469.  Did have a head CT which was essentially normal.  Very concerned because her blood pressures have been quite elevated and she still just not feeling well.  She is feeling very lethargic feeling dizzy when she stands up she just does not feel well she has had some pressure in the left side of her head.    She is also been dealing with an abscess in her right axilla.  She is been doing some warm compresses and it was drained in the emergency department.  She has not had a white blood cell count checked since then.  It did drain and it was lanced but they did not pack it.  She was put on doxycycline but that she could not tolerate it because of vomiting.  They did call in a prescription for clindamycin but she has not started it yet she wanted to make sure it would cover for staph because her granddaughter was recently diagnosed with a staph infection.  2019 October CT Head WO IV Contrast10/24/2019 [facility] Result Impression  IMPRESSION: No acute intracranial abnormality.  Electronically Signed by: Ross Marcus  Result Narrative  INDICATION:HA, dizziness.  COMPARISON:CT head 04/04/2018.  TECHNIQUE:Multiple axial images obtained from the skull base to the vertex without IV contrastwere obtained on10/24/2019 8:04 AM  FINDINGS:  No evidence of hydrocephalus. No intracranial mass, mass effect or midline shift. No acute infarction evident. No intracranial hemorrhage. Paranasal sinuses: Clear. Mastoid air cells: Clear. Calvarium: Intact.  Other Result Information  Acute Interface, Incoming Rad Results - 06/06/2018  9:37 AM EDT INDICATION:    HA,  dizziness.  COMPARISON:  CT head 04/04/2018.  TECHNIQUE:    Multiple axial images obtained from the skull base to the vertex without IV contrast  were obtained on  06/06/2018 8:04 AM  FINDINGS:  No evidence of hydrocephalus. No intracranial mass, mass effect or midline shift. No acute infarction evident. No intracranial hemorrhage. Paranasal sinuses: Clear. Mastoid air cells: Clear. Calvarium: Intact.   IMPRESSION: No acute intracranial abnormality.  Electronically Signed by: Ross Marcus  Status Results Details        Past medical history, Surgical history, Family history not pertinant except as noted below, Social history, Allergies, and medications have been entered into the medical record, reviewed, and corrections made.   Review of Systems: No fevers, chills, night sweats, weight loss, chest pain, or shortness of breath.   Objective:    General: Well Developed, well nourished, and in no acute distress.  Neuro: Alert and oriented x3, extra-ocular muscles intact, sensation grossly intact.  HEENT: Normocephalic, atraumatic  Skin: Warm and dry, no rashes.  Under the right axilla she has an approximately 1-1/2 cm by half a centimeter oval-shaped indurated lesion I can see where the incision was performed.  It actually looks well-healed there is no erythema or drainage from the wound currently. Cardiac: Regular rate and rhythm, no murmurs rubs or gallops, no lower extremity edema.  Respiratory: Clear to auscultation bilaterally. Not using accessory muscles, speaking in full sentences.   Impression and Recommendations:   Abscess in right axilla-there is still a fair amount of induration.  But no significant erythema  which is reassuring.  We discussed options.  She was unable to tolerate the doxycycline but does have a prescription to pick up for clindamycin.  I did double check and it will cover staph just not MRSA.  So encouraged her to pick that up and go ahead and start  it.  I will see her on Friday if is not improving before the weekend in case we have to lancet.  She has a second lesion just a little smaller and just distal to this that we will keep an eye on as well.  Warm compresses.  Will check CBC.   Hypertension-uncontrolled.  I really think she should go up on her metoprolol.  She is a little nervous to go up to 100 mg we discussed doing 75 mg twice a day.  She will go home and go ahead and take an extra 25 mg today since her pressure is a little high here.  Just encouraged her to continue to hydrate well and really minimize salt intake.  Lethargy and dizziness-I still really feel like she needs to get back in with neurology and we have discussed this several times.

## 2018-06-12 NOTE — Patient Instructions (Signed)
Pick up the clindamycin and go ahead and start that.

## 2018-06-14 ENCOUNTER — Ambulatory Visit: Payer: Self-pay | Admitting: Family Medicine

## 2018-06-14 DIAGNOSIS — G4733 Obstructive sleep apnea (adult) (pediatric): Secondary | ICD-10-CM | POA: Diagnosis not present

## 2018-06-14 DIAGNOSIS — I10 Essential (primary) hypertension: Secondary | ICD-10-CM | POA: Insufficient documentation

## 2018-06-14 DIAGNOSIS — R030 Elevated blood-pressure reading, without diagnosis of hypertension: Secondary | ICD-10-CM | POA: Insufficient documentation

## 2018-06-18 ENCOUNTER — Other Ambulatory Visit: Payer: Self-pay

## 2018-06-18 ENCOUNTER — Encounter (HOSPITAL_BASED_OUTPATIENT_CLINIC_OR_DEPARTMENT_OTHER): Payer: Self-pay | Admitting: *Deleted

## 2018-06-18 ENCOUNTER — Emergency Department (HOSPITAL_BASED_OUTPATIENT_CLINIC_OR_DEPARTMENT_OTHER)
Admission: EM | Admit: 2018-06-18 | Discharge: 2018-06-18 | Disposition: A | Discharge status: Home / Self Care | Payer: Medicare HMO | Attending: Emergency Medicine | Admitting: Emergency Medicine

## 2018-06-18 DIAGNOSIS — Z87891 Personal history of nicotine dependence: Secondary | ICD-10-CM | POA: Insufficient documentation

## 2018-06-18 DIAGNOSIS — Z79899 Other long term (current) drug therapy: Secondary | ICD-10-CM | POA: Diagnosis not present

## 2018-06-18 DIAGNOSIS — I10 Essential (primary) hypertension: Secondary | ICD-10-CM | POA: Diagnosis not present

## 2018-06-18 DIAGNOSIS — R1013 Epigastric pain: Secondary | ICD-10-CM | POA: Diagnosis not present

## 2018-06-18 DIAGNOSIS — D649 Anemia, unspecified: Secondary | ICD-10-CM | POA: Insufficient documentation

## 2018-06-18 DIAGNOSIS — J45909 Unspecified asthma, uncomplicated: Secondary | ICD-10-CM | POA: Insufficient documentation

## 2018-06-18 LAB — COMPREHENSIVE METABOLIC PANEL
ALT: 20 U/L (ref 0–44)
AST: 16 U/L (ref 15–41)
Albumin: 3.6 g/dL (ref 3.5–5.0)
Alkaline Phosphatase: 70 U/L (ref 38–126)
Anion gap: 8 (ref 5–15)
BUN: 15 mg/dL (ref 6–20)
CO2: 24 mmol/L (ref 22–32)
Calcium: 8.6 mg/dL — ABNORMAL LOW (ref 8.9–10.3)
Chloride: 108 mmol/L (ref 98–111)
Creatinine, Ser: 0.6 mg/dL (ref 0.44–1.00)
GFR calc Af Amer: >60 mL/min (ref >60)
GFR calc non Af Amer: >60 mL/min (ref >60)
Glucose, Bld: 100 mg/dL — ABNORMAL HIGH (ref 70–99)
Potassium: 3.8 mmol/L (ref 3.5–5.1)
Sodium: 140 mmol/L (ref 135–145)
Total Bilirubin: 0.3 mg/dL (ref 0.3–1.2)
Total Protein: 6.7 g/dL (ref 6.5–8.1)

## 2018-06-18 LAB — CBC WITH DIFFERENTIAL/PLATELET
Abs Immature Granulocytes: 0.02 10*3/uL (ref 0.00–0.07)
Basophils Absolute: 0.1 10*3/uL (ref 0.0–0.1)
Basophils Relative: 1 %
Eosinophils Absolute: 0.2 10*3/uL (ref 0.0–0.5)
Eosinophils Relative: 2 %
HCT: 42.7 % (ref 36.0–46.0)
Hemoglobin: 13.6 g/dL (ref 12.0–15.0)
Immature Granulocytes: 0 %
Lymphocytes Relative: 24 %
Lymphs Abs: 1.6 10*3/uL (ref 0.7–4.0)
MCH: 29.2 pg (ref 26.0–34.0)
MCHC: 31.9 g/dL (ref 30.0–36.0)
MCV: 91.8 fL (ref 80.0–100.0)
Monocytes Absolute: 0.7 10*3/uL (ref 0.1–1.0)
Monocytes Relative: 10 %
Neutro Abs: 4.2 10*3/uL (ref 1.7–7.7)
Neutrophils Relative %: 63 %
Platelets: 206 10*3/uL (ref 150–400)
RBC: 4.65 MIL/uL (ref 3.87–5.11)
RDW: 13.7 % (ref 11.5–15.5)
WBC: 6.8 10*3/uL (ref 4.0–10.5)
nRBC: 0 % (ref 0.0–0.2)

## 2018-06-18 LAB — URINALYSIS, ROUTINE W REFLEX MICROSCOPIC
Bilirubin Urine: NEGATIVE
Glucose, UA: NEGATIVE mg/dL
Hgb urine dipstick: NEGATIVE
Ketones, ur: NEGATIVE mg/dL
Leukocytes, UA: NEGATIVE
Nitrite: NEGATIVE
Protein, ur: NEGATIVE mg/dL
Specific Gravity, Urine: 1.025 (ref 1.005–1.030)
pH: 6 (ref 5.0–8.0)

## 2018-06-18 LAB — LIPASE, BLOOD: Lipase: 36 U/L (ref 11–51)

## 2018-06-18 LAB — PREGNANCY, URINE: Preg Test, Ur: NEGATIVE

## 2018-06-18 MED ORDER — FAMOTIDINE 20 MG PO TABS
40.0000 mg | ORAL_TABLET | Freq: Once | ORAL | Status: AC
  Administered 2018-06-18: 40 mg via ORAL
  Filled 2018-06-18: qty 2

## 2018-06-18 MED ORDER — ALUM & MAG HYDROXIDE-SIMETH 200-200-20 MG/5ML PO SUSP: 30.0000 mL | Freq: Once | ORAL | Status: DC

## 2018-06-18 MED ORDER — SUCRALFATE 1 GM/10ML PO SUSP
1.0000 g | Freq: Three times a day (TID) | ORAL | Status: DC
  Administered 2018-06-18: 1 g via ORAL
  Filled 2018-06-18: qty 10

## 2018-06-18 MED ORDER — PANTOPRAZOLE SODIUM 40 MG IV SOLR
40.0000 mg | Freq: Once | INTRAVENOUS | Status: DC
  Filled 2018-06-18: qty 40

## 2018-06-18 MED ORDER — SUCRALFATE 1 GM/10ML PO SUSP: 1.0000 g | Freq: Three times a day (TID) | ORAL | 0 refills | Status: DC

## 2018-06-18 MED ORDER — FAMOTIDINE IN NACL 20-0.9 MG/50ML-% IV SOLN
20.0000 mg | Freq: Once | INTRAVENOUS | Status: DC
  Filled 2018-06-18: qty 50

## 2018-06-18 MED ORDER — ONDANSETRON HCL 4 MG/2ML IJ SOLN
4.0000 mg | Freq: Once | INTRAMUSCULAR | Status: DC
  Filled 2018-06-18: qty 2

## 2018-06-18 MED ORDER — ONDANSETRON 4 MG PO TBDP
4.0000 mg | ORAL_TABLET | Freq: Once | ORAL | Status: AC
  Administered 2018-06-18: 4 mg via ORAL
  Filled 2018-06-18: qty 1

## 2018-06-18 NOTE — ED Notes (Signed)
ED Provider at bedside. 

## 2018-06-18 NOTE — Discharge Instructions (Signed)
You were seen in the ER for upper abdominal pain.  Labs looked normal.  Urine looked normal.  I reviewed your last EGD that was done 1 year ago and this was negative for ulcers, gastritis and biopsies for Barrett's esophagus were negative.  Return to the ER for fevers greater than 100.85F, vomiting, blood in your vomit, blood in your stool, burning with urination, blood in your urine, localized flank pain.  Use Carafate suspension as prescribed before meals.  Take either ranitidine or famotidine twice a day.  Avoid irritating foods and drinks such as alcohol, ibuprofen containing products, greasy or acidic foods as this can irritate her stomach more.

## 2018-06-18 NOTE — ED Triage Notes (Signed)
Abdominal pain for a week.
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
## 2018-06-19 LAB — URINE CULTURE: Culture: <10000 — AB

## 2018-06-20 ENCOUNTER — Encounter: Payer: Self-pay | Admitting: Family Medicine

## 2018-06-20 ENCOUNTER — Ambulatory Visit (INDEPENDENT_AMBULATORY_CARE_PROVIDER_SITE_OTHER): Payer: Medicare HMO | Admitting: Family Medicine

## 2018-06-20 ENCOUNTER — Ambulatory Visit (INDEPENDENT_AMBULATORY_CARE_PROVIDER_SITE_OTHER): Payer: Medicare HMO

## 2018-06-20 VITALS — BP 142/78 | HR 87 | Ht 62.0 in | Wt 224.0 lb

## 2018-06-20 VITALS — BP 143/68 | HR 87

## 2018-06-20 DIAGNOSIS — M79672 Pain in left foot: Secondary | ICD-10-CM | POA: Diagnosis not present

## 2018-06-20 DIAGNOSIS — R140 Abdominal distension (gaseous): Secondary | ICD-10-CM

## 2018-06-20 DIAGNOSIS — K5909 Other constipation: Secondary | ICD-10-CM

## 2018-06-20 DIAGNOSIS — R269 Unspecified abnormalities of gait and mobility: Secondary | ICD-10-CM

## 2018-06-20 DIAGNOSIS — M79671 Pain in right foot: Secondary | ICD-10-CM

## 2018-06-20 DIAGNOSIS — M722 Plantar fascial fibromatosis: Secondary | ICD-10-CM

## 2018-06-20 DIAGNOSIS — Z931 Gastrostomy status: Secondary | ICD-10-CM | POA: Diagnosis not present

## 2018-06-20 DIAGNOSIS — I10 Essential (primary) hypertension: Secondary | ICD-10-CM

## 2018-06-20 DIAGNOSIS — I471 Supraventricular tachycardia, unspecified: Secondary | ICD-10-CM

## 2018-06-20 DIAGNOSIS — M7989 Other specified soft tissue disorders: Secondary | ICD-10-CM | POA: Diagnosis not present

## 2018-06-20 MED ORDER — AMBULATORY NON FORMULARY MEDICATION: 0 refills | Status: DC

## 2018-06-20 NOTE — Progress Notes (Signed)
Subjective:    CC: BP check   HPI:  HTN -she reports that she did take her blood pressure pill this morning.  She feels like overall her blood pressures have been a little better than they have been over the last several weeks.  She has not increased her metoprolol to 75 mg she is staying at 50 mg and has not taken her Spironolactone in about 3 days.  PSVT - Saw say the cardiologist and felt she wasn't listened to.  She is interested in going back to see cardiology at [facility] which she saw years ago.  Now that they have a location in [facility] which is where she lives she is interested in seeing them again.  She is also having a lot of problems with her bowels again.  For the last week and a half she has had some nausea, early satiety increased belching and gas and epigastric pain.  Back she went to the emergency department yesterday.  They gave her Carafate and put her on Zantac.  They also gave her some Zofran she says the Zofran is a little constipating passed some hard pebbles this morning.  Past medical history, Surgical history, Family history not pertinant except as noted below, Social history, Allergies, and medications have been entered into the medical record, reviewed, and corrections made.   Review of Systems: No fevers, chills, night sweats, weight loss, chest pain, or shortness of breath.   Objective:    General: Well Developed, well nourished, and in no acute distress.  Neuro: Alert and oriented x3, extra-ocular muscles intact, sensation grossly intact.  HEENT: Normocephalic, atraumatic  Skin: Warm and dry, no rashes. Cardiac: Regular rate and rhythm, no murmurs rubs or gallops, no lower extremity edema.  Respiratory: Clear to auscultation bilaterally. Not using accessory muscles, speaking in full sentences.   Impression and Recommendations:    HTN - BP borderline.  Still encourage her to try inc metoprolol to 75mg .  10 you Spironolactone for now.  We will go ahead and  place new referral to [facility] at the [facility] location.  PSVT-we will refer.  Belching/bloating/gas-could consider a trial of probiotic.  She is taking them in the past but felt like it actually caused more bloating.  We also discussed getting her bowels moving.  Lactulose tends to work well for her so encouraged her to start her regimen this evening.  Chronic constipation-start lactulose as above.  She does have an appoint with her GI, [name] in about 2 weeks.  She just saw Dr. Georgina Snell for bilateral foot pain earlier this afternoon.  They are doing further work-up with MRI.  She is having of significant increased pain and is still having a hard time with balance and using her cane so they are going to get her a walker.
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
## 2018-06-20 NOTE — Patient Instructions (Signed)
Thank you for coming in today. You should hear about referral soon.  You should also hear about MRI.  Let me know if you do not hear anything.   Use the walker as needed.

## 2018-06-21 ENCOUNTER — Telehealth: Payer: Self-pay

## 2018-06-21 MED ORDER — AMBULATORY NON FORMULARY MEDICATION: 0 refills | Status: DC

## 2018-06-21 NOTE — Telephone Encounter (Signed)
Call from [facility] states pt needs RX for 4 wheeled walker sent, previous RX not specific.   Reprinted under Dr Madilyn Fireman since she is in office and faxed

## 2018-06-21 NOTE — Telephone Encounter (Signed)
Pt called stating she took RX to [facility] [phone]) and they would not fill the RX for her walker due to lack of office notes.   Called and spoke with Rise Paganini at [facility] and she states that she does not see the copy of RX scanned into the computer. She advised me to fax over a copy of office notes from Dr Georgina Snell and a copy of the RX if we have it to [phone]  I called and advised pt that I will fax over what they recommend, but she needs to give them a copy of her RX also so they can have it on file. Pt states she will not take them her RX until they receive the office notes.   Notes faxed with note to pharmacy to please call pt when they receive it so she can take them RX.

## 2018-06-24 ENCOUNTER — Telehealth: Payer: Self-pay

## 2018-06-24 DIAGNOSIS — R262 Difficulty in walking, not elsewhere classified: Secondary | ICD-10-CM | POA: Diagnosis not present

## 2018-06-24 DIAGNOSIS — R2689 Other abnormalities of gait and mobility: Secondary | ICD-10-CM | POA: Diagnosis not present

## 2018-06-24 DIAGNOSIS — M722 Plantar fascial fibromatosis: Secondary | ICD-10-CM | POA: Diagnosis not present

## 2018-06-24 DIAGNOSIS — G4733 Obstructive sleep apnea (adult) (pediatric): Secondary | ICD-10-CM | POA: Diagnosis not present

## 2018-06-24 NOTE — Telephone Encounter (Signed)
Jennifer [name] for [name] formally known as [name] called and stated that in order for insurance to cover walker for patient the office note needs to include referral for walker. Please advise. Jennifer [name],CMA

## 2018-06-25 DIAGNOSIS — J069 Acute upper respiratory infection, unspecified: Secondary | ICD-10-CM | POA: Diagnosis not present

## 2018-06-25 DIAGNOSIS — H9203 Otalgia, bilateral: Secondary | ICD-10-CM | POA: Diagnosis not present

## 2018-06-25 DIAGNOSIS — Z87891 Personal history of nicotine dependence: Secondary | ICD-10-CM | POA: Diagnosis not present

## 2018-06-25 DIAGNOSIS — R0982 Postnasal drip: Secondary | ICD-10-CM | POA: Diagnosis not present

## 2018-06-25 NOTE — Addendum Note (Signed)
Addended by: Beatrice Lecher D on: 06/25/2018 05:21 PM   Modules accepted: Orders

## 2018-06-25 NOTE — Telephone Encounter (Signed)
Printed office notes that show documentation from order for walker.  Fax to medical supply company.

## 2018-06-25 NOTE — Progress Notes (Signed)
MRI Rt foot changed, to WO per Provider request

## 2018-06-25 NOTE — Addendum Note (Signed)
Addended by: Alena Bills R on: 06/25/2018 03:52 PM   Modules accepted: Orders

## 2018-06-26 ENCOUNTER — Emergency Department (HOSPITAL_BASED_OUTPATIENT_CLINIC_OR_DEPARTMENT_OTHER)
Admission: EM | Admit: 2018-06-26 | Discharge: 2018-06-26 | Disposition: A | Discharge status: Home / Self Care | Payer: Medicare HMO | Attending: Emergency Medicine | Admitting: Emergency Medicine

## 2018-06-26 ENCOUNTER — Encounter (HOSPITAL_BASED_OUTPATIENT_CLINIC_OR_DEPARTMENT_OTHER): Payer: Self-pay

## 2018-06-26 ENCOUNTER — Other Ambulatory Visit: Payer: Self-pay

## 2018-06-26 ENCOUNTER — Telehealth: Payer: Self-pay | Admitting: Family Medicine

## 2018-06-26 DIAGNOSIS — Y929 Unspecified place or not applicable: Secondary | ICD-10-CM | POA: Diagnosis not present

## 2018-06-26 DIAGNOSIS — E785 Hyperlipidemia, unspecified: Secondary | ICD-10-CM | POA: Diagnosis not present

## 2018-06-26 DIAGNOSIS — Y33XXXA Other specified events, undetermined intent, initial encounter: Secondary | ICD-10-CM | POA: Insufficient documentation

## 2018-06-26 DIAGNOSIS — I10 Essential (primary) hypertension: Secondary | ICD-10-CM | POA: Insufficient documentation

## 2018-06-26 DIAGNOSIS — J45909 Unspecified asthma, uncomplicated: Secondary | ICD-10-CM | POA: Insufficient documentation

## 2018-06-26 DIAGNOSIS — Z87891 Personal history of nicotine dependence: Secondary | ICD-10-CM | POA: Insufficient documentation

## 2018-06-26 DIAGNOSIS — Y939 Activity, unspecified: Secondary | ICD-10-CM | POA: Insufficient documentation

## 2018-06-26 DIAGNOSIS — J069 Acute upper respiratory infection, unspecified: Secondary | ICD-10-CM | POA: Diagnosis not present

## 2018-06-26 DIAGNOSIS — S46812A Strain of other muscles, fascia and tendons at shoulder and upper arm level, left arm, initial encounter: Secondary | ICD-10-CM | POA: Diagnosis not present

## 2018-06-26 DIAGNOSIS — Y998 Other external cause status: Secondary | ICD-10-CM | POA: Insufficient documentation

## 2018-06-26 DIAGNOSIS — R0982 Postnasal drip: Secondary | ICD-10-CM | POA: Diagnosis not present

## 2018-06-26 DIAGNOSIS — H9203 Otalgia, bilateral: Secondary | ICD-10-CM | POA: Diagnosis not present

## 2018-06-26 DIAGNOSIS — R0789 Other chest pain: Secondary | ICD-10-CM | POA: Diagnosis not present

## 2018-06-26 DIAGNOSIS — Z0130 Encounter for examination of blood pressure without abnormal findings: Secondary | ICD-10-CM

## 2018-06-26 DIAGNOSIS — Z79899 Other long term (current) drug therapy: Secondary | ICD-10-CM | POA: Diagnosis not present

## 2018-06-26 DIAGNOSIS — S29012A Strain of muscle and tendon of back wall of thorax, initial encounter: Secondary | ICD-10-CM | POA: Insufficient documentation

## 2018-06-26 DIAGNOSIS — R51 Headache: Secondary | ICD-10-CM | POA: Diagnosis present

## 2018-06-26 HISTORY — DX: Ventricular premature depolarization: I49.3

## 2018-06-26 LAB — TROPONIN I: Troponin I: <0.03 ng/mL (ref <0.03)

## 2018-06-26 MED ORDER — ACETAMINOPHEN 500 MG PO TABS
1000.0000 mg | ORAL_TABLET | Freq: Once | ORAL | Status: DC
  Filled 2018-06-26: qty 2

## 2018-06-26 MED ORDER — ACETAMINOPHEN 160 MG/5ML PO SOLN
650.0000 mg | Freq: Once | ORAL | Status: AC
  Administered 2018-06-26: 650 mg via ORAL
  Filled 2018-06-26: qty 20.3
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
## 2018-06-26 NOTE — Discharge Instructions (Addendum)
Your evaluated today for elevated blood pressure and left-sided neck pain.  Take Tylenol as needed for your pain.  Follow-Up with PCP for reevaluation in 2 days.  Return to the ED for new or worsening symptoms

## 2018-06-26 NOTE — Telephone Encounter (Signed)
lvm asking pt to rtn call about how much medication she is taking. Maryruth Eve, [place] Crocker, CMA

## 2018-06-26 NOTE — Telephone Encounter (Signed)
Pt. Called in stating that her BP was high and her meds weren't working. She was upset and crying. Pt wanted to schedule for tomorrow morning, nothing available. I told her that I would make you aware.

## 2018-06-26 NOTE — Telephone Encounter (Signed)
She is taking the 75 mg of metoprolol which was recommended?

## 2018-06-26 NOTE — Telephone Encounter (Signed)
Pt returned call and stated that she has been taking the 75 mg of metoprolol. She said that her bp was elevated this morning. She went to [place], pt c/o headache, neck pain and feeling loopy. Pt advised that this would be sent to Dr. Madilyn Fireman.Maryruth Eve, [place] Crocker

## 2018-06-26 NOTE — ED Triage Notes (Signed)
C/o HA and elevated BP x 2 weeks-NAD-steady gait

## 2018-06-26 NOTE — Telephone Encounter (Signed)
Spoke to someone is [employer] and the paperwork was received and patient has picked up the walker. [name],CMA

## 2018-06-27 NOTE — Telephone Encounter (Signed)
I would rec 100mg  of metoprol throught the weekend and see if her pressures are better.

## 2018-06-28 NOTE — Telephone Encounter (Signed)
Pt informed of recommendations. She wanted to know if she should be taking 100 mg BID? She has been taking the 75 mg BID but wanted clarification about this. Will send to pcp for advice.Marland KitchenMarland KitchenAudelia Hives [place]

## 2018-06-28 NOTE — Telephone Encounter (Signed)
lvm advising pt of recommendations.Marland KitchenMarland KitchenAudelia [name] [place]

## 2018-06-28 NOTE — Telephone Encounter (Signed)
Yes, please take the metoprolol tartrate twice a day.

## 2018-07-01 ENCOUNTER — Telehealth: Payer: Self-pay | Admitting: Family Medicine

## 2018-07-01 NOTE — Telephone Encounter (Signed)
Completed peer to peer with [employer] Nurse line needed more information about duration of care.  Care has exceeded 43-month.  Found documentation from early May.  Authorization given.  Authorization number not obtained at the time of phone call.  Nurse line set authorization should be available via [employer] website in about 5 minutes.

## 2018-07-02 ENCOUNTER — Ambulatory Visit (INDEPENDENT_AMBULATORY_CARE_PROVIDER_SITE_OTHER): Payer: Medicare HMO | Admitting: Family Medicine

## 2018-07-02 ENCOUNTER — Encounter: Payer: Self-pay | Admitting: Family Medicine

## 2018-07-02 VITALS — BP 139/80 | HR 90 | Ht 61.81 in | Wt 225.0 lb

## 2018-07-02 VITALS — BP 139/80 | HR 90 | Wt 225.0 lb

## 2018-07-02 DIAGNOSIS — H9202 Otalgia, left ear: Secondary | ICD-10-CM | POA: Diagnosis not present

## 2018-07-02 DIAGNOSIS — M79671 Pain in right foot: Secondary | ICD-10-CM

## 2018-07-02 DIAGNOSIS — J018 Other acute sinusitis: Secondary | ICD-10-CM | POA: Diagnosis not present

## 2018-07-02 DIAGNOSIS — M79672 Pain in left foot: Secondary | ICD-10-CM

## 2018-07-02 DIAGNOSIS — I10 Essential (primary) hypertension: Secondary | ICD-10-CM

## 2018-07-02 DIAGNOSIS — M722 Plantar fascial fibromatosis: Secondary | ICD-10-CM

## 2018-07-02 DIAGNOSIS — R269 Unspecified abnormalities of gait and mobility: Secondary | ICD-10-CM | POA: Diagnosis not present

## 2018-07-02 MED ORDER — CLINDAMYCIN HCL 150 MG PO CAPS: 150.0000 mg | ORAL_CAPSULE | Freq: Three times a day (TID) | ORAL | 0 refills | Status: DC
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
## 2018-07-02 NOTE — Progress Notes (Signed)
Subjective:    CC: Left ear pain x 2 wks.    HPI:  Hypertension- Pt denies chest pain, SOB, dizziness, or heart palpitations.  Taking meds as directed w/o problems.  Denies medication side effects.  She had called since I last saw her and she was told to inc metoprolol to 100mg . BP is better but she feels it is still up and down/fluctuating.    Left ear pain x 2 weeks.  Dr. Caryn Section examined it.  Says it has been popping/cracking.  NO fever or chills. Blowing out blood and beige colored mucous from ehr sinuses. Feels dizzy when she stands up.  Feels like her sinus congestion is impacted like it is very hard to get it out.  She has been doing some sinus rinses as well as using mometasone nasal spray but not every day.  She also c/o of bilateral foot pain. Says worse esp at night. Painful to walk on. She does have a walker and is following up with sports med. Thinks was dx with periph neuropathy by neurology at one pointl    Past medical history, Surgical history, Family history not pertinant except as noted below, Social history, Allergies, and medications have been entered into the medical record, reviewed, and corrections made.   Review of Systems: No fevers, chills, night sweats, weight loss, chest pain, or shortness of breath.   Objective:    General: Well Developed, well nourished, and in no acute distress.  Neuro: Alert and oriented x3, extra-ocular muscles intact, sensation grossly intact.  HEENT: Normocephalic, atraumatic  Skin: Warm and dry, no rashes. Cardiac: Regular rate and rhythm, no murmurs rubs or gallops, no lower extremity edema.  Respiratory: Clear to auscultation bilaterally. Not using accessory muscles, speaking in full sentences.   Impression and Recommendations:    HTN -I do feel like her blood pressure looks better on the 100 mg metoprolol.  We had bumped it up after she had called and left a telephone message that her blood pressure was not well controlled in the 75  mg.  Sinusitis with left eustachian tube dysfunction-likely secondary to the sinusitis.  Will treat with the myosin.  Even if this is not first-line therapy for acute sinusitis is 1 of the few in a box that she can actually take in the past she had actually has improved from her sinus infections with this medication so we will send over prescription pharmacy.  Call if not better in 1 week.  You with Nasonex.  Lateral foot pain-does have a follow-up later today with sports medicine for further work-up and evaluation.  Now her pain is mostly in her heels.  She is being treated for plantar fasciitis.

## 2018-07-02 NOTE — Patient Instructions (Signed)
Sinusitis, Adult Sinusitis is soreness and inflammation of your sinuses. Sinuses are hollow spaces in the bones around your face. They are located:  Around your eyes.  In the middle of your forehead.  Behind your nose.  In your cheekbones.  Your sinuses and nasal passages are lined with a stringy fluid (mucus). Mucus normally drains out of your sinuses. When your nasal tissues get inflamed or swollen, the mucus can get trapped or blocked so air cannot flow through your sinuses. This lets bacteria, viruses, and funguses grow, and that leads to infection. Follow these instructions at home: Medicines  Take, use, or apply over-the-counter and prescription medicines only as told by your doctor. These may include nasal sprays.  If you were prescribed an antibiotic medicine, take it as told by your doctor. Do not stop taking the antibiotic even if you start to feel better. Hydrate and Humidify  Drink enough water to keep your pee (urine) clear or pale yellow.  Use a cool mist humidifier to keep the humidity level in your home above 50%.  Breathe in steam for 10-15 minutes, 3-4 times a day or as told by your doctor. You can do this in the bathroom while a hot shower is running.  Try not to spend time in cool or dry air. Rest  Rest as much as possible.  Sleep with your head raised (elevated).  Make sure to get enough sleep each night. General instructions  Put a warm, moist washcloth on your face 3-4 times a day or as told by your doctor. This will help with discomfort.  Wash your hands often with soap and water. If there is no soap and water, use hand sanitizer.  Do not smoke. Avoid being around people who are smoking (secondhand smoke).  Keep all follow-up visits as told by your doctor. This is important. Contact a doctor if:  You have a fever.  Your symptoms get worse.  Your symptoms do not get better within 10 days. Get help right away if:  You have a very bad  headache.  You cannot stop throwing up (vomiting).  You have pain or swelling around your face or eyes.  You have trouble seeing.  You feel confused.  Your neck is stiff.  You have trouble breathing. This information is not intended to replace advice given to you by your health care provider. Make sure you discuss any questions you have with your health care provider. Document Released: 01/17/2008 Document Revised: 03/26/2016 Document Reviewed: 05/26/2015 Elsevier Interactive Patient Education  2018 [employer]  

## 2018-07-02 NOTE — Patient Instructions (Addendum)
Thank you for coming in today. Use Night Splint at bedtime.  Use can also use str[place]burg sock at bedtime.  Use cam walker on the worse foot as needed.   Recheck after MRI.  Give it a few days.  Bring the disc.  Schedule with me Tueaday.   We should have the MRI read by then.

## 2018-07-04 ENCOUNTER — Other Ambulatory Visit: Payer: Self-pay | Admitting: Family Medicine

## 2018-07-04 DIAGNOSIS — M722 Plantar fascial fibromatosis: Secondary | ICD-10-CM | POA: Diagnosis not present

## 2018-07-04 DIAGNOSIS — L02419 Cutaneous abscess of limb, unspecified: Secondary | ICD-10-CM | POA: Diagnosis not present

## 2018-07-04 DIAGNOSIS — L03119 Cellulitis of unspecified part of limb: Secondary | ICD-10-CM | POA: Diagnosis not present

## 2018-07-04 DIAGNOSIS — E876 Hypokalemia: Secondary | ICD-10-CM | POA: Diagnosis not present

## 2018-07-04 DIAGNOSIS — I491 Atrial premature depolarization: Secondary | ICD-10-CM | POA: Insufficient documentation

## 2018-07-04 DIAGNOSIS — I493 Ventricular premature depolarization: Secondary | ICD-10-CM | POA: Insufficient documentation

## 2018-07-04 DIAGNOSIS — Z87891 Personal history of nicotine dependence: Secondary | ICD-10-CM | POA: Diagnosis not present

## 2018-07-04 DIAGNOSIS — R531 Weakness: Secondary | ICD-10-CM | POA: Diagnosis not present

## 2018-07-04 DIAGNOSIS — M79671 Pain in right foot: Secondary | ICD-10-CM | POA: Diagnosis not present

## 2018-07-04 DIAGNOSIS — I160 Hypertensive urgency: Secondary | ICD-10-CM | POA: Diagnosis not present

## 2018-07-04 DIAGNOSIS — M79672 Pain in left foot: Secondary | ICD-10-CM | POA: Diagnosis not present

## 2018-07-04 DIAGNOSIS — I471 Supraventricular tachycardia: Secondary | ICD-10-CM | POA: Insufficient documentation

## 2018-07-04 DIAGNOSIS — R9431 Abnormal electrocardiogram [ECG] [EKG]: Secondary | ICD-10-CM | POA: Diagnosis not present

## 2018-07-04 DIAGNOSIS — R0602 Shortness of breath: Secondary | ICD-10-CM | POA: Diagnosis not present

## 2018-07-04 LAB — BASIC METABOLIC PANEL
BUN: 15 (ref 4–21)
Glucose: 168
Potassium: 3.6 (ref 3.4–5.3)
Sodium: 140 (ref 137–147)

## 2018-07-04 NOTE — Progress Notes (Signed)
Error
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
## 2018-07-05 ENCOUNTER — Telehealth: Payer: Self-pay | Admitting: Family Medicine

## 2018-07-05 ENCOUNTER — Ambulatory Visit: Payer: Medicare HMO | Admitting: Cardiology

## 2018-07-05 ENCOUNTER — Encounter: Payer: Self-pay | Admitting: Cardiology

## 2018-07-05 ENCOUNTER — Other Ambulatory Visit: Payer: Self-pay | Admitting: Gynecologic Oncology

## 2018-07-05 ENCOUNTER — Telehealth: Payer: Self-pay | Admitting: Cardiology

## 2018-07-05 ENCOUNTER — Encounter

## 2018-07-05 ENCOUNTER — Telehealth: Payer: Self-pay

## 2018-07-05 DIAGNOSIS — I10 Essential (primary) hypertension: Secondary | ICD-10-CM | POA: Diagnosis not present

## 2018-07-05 DIAGNOSIS — I471 Supraventricular tachycardia: Secondary | ICD-10-CM

## 2018-07-05 DIAGNOSIS — E876 Hypokalemia: Secondary | ICD-10-CM | POA: Insufficient documentation

## 2018-07-05 DIAGNOSIS — I491 Atrial premature depolarization: Secondary | ICD-10-CM

## 2018-07-05 DIAGNOSIS — I493 Ventricular premature depolarization: Secondary | ICD-10-CM

## 2018-07-05 DIAGNOSIS — R002 Palpitations: Secondary | ICD-10-CM | POA: Diagnosis not present

## 2018-07-05 DIAGNOSIS — M25559 Pain in unspecified hip: Secondary | ICD-10-CM

## 2018-07-05 MED ORDER — FLECAINIDE ACETATE 50 MG PO TABS: 50.0000 mg | ORAL_TABLET | Freq: Two times a day (BID) | ORAL | 3 refills | Status: DC

## 2018-07-05 MED ORDER — POTASSIUM CHLORIDE 20 MEQ/15ML (10%) PO SOLN: 20.0000 meq | Freq: Every day | ORAL | 0 refills | Status: DC

## 2018-07-05 MED FILL — FLECAINIDE ACETATE 50 MG TA: 50 | 30 days supply | Qty: 60 | Fill #0 | Status: TO

## 2018-07-05 MED FILL — POTASSIUM CL 10% (20 MEQ/15: 20 MEQ/15ML | 32 days supply | Qty: 473 | Fill #0 | Status: TO

## 2018-07-05 NOTE — Telephone Encounter (Signed)
Patient called in stating that she had to cancel the appointment on 11/26 with Dr.Corey due to her getting a heart monitor placed. Wanted to know if Dr. [name]  nurse can call her back with results of the MRI of her feet and she will bring the disc in on 12/2. Please contact and advise.

## 2018-07-05 NOTE — Patient Instructions (Addendum)
Medication Instructions:  Your physician has recommended you make the following change in your medication:  START flecainide (tambocor) 50 mg: Take 1 tablet twice daily (START TOMORROW, 07/06/18)  START potassium chloride 20 mEq: Take 15 ml (20 mEq) daily   If you need a refill on your cardiac medications before your next appointment, please call your pharmacy.   Lab work: None  If you have labs (blood work) drawn today and your tests are completely normal, you will receive your results only by: Marland Kitchen MyChart Message (if you have MyChart) OR . A paper copy in the mail If you have any lab test that is abnormal or we need to change your treatment, we will call you to review the results.  Testing/Procedures: You had an EKG today.   Your physician has recommended that you wear a ZIO patch monitor. ZIO patch monitors are medical devices that record the heart's electrical activity. Doctors most often use these monitors to diagnose arrhythmias. Arrhythmias are problems with the speed or rhythm of the heartbeat. The monitor is a small, portable device. You can wear one while you do your normal daily activities. This is usually used to diagnose what is causing palpitations/syncope (passing out). Wear for 14 days.   Follow-Up: At [facility], you and your health needs are our priority.  As part of our continuing mission to provide you with exceptional heart care, we have created designated Provider Care Teams.  These Care Teams include your primary Cardiologist (physician) and Advanced Practice Providers (APPs -  Physician Assistants and Nurse Practitioners) who all work together to provide you with the care you need, when you need it. You will need a follow up appointment in 4 weeks.        1. Avoid all over-the-counter antihistamines except Claritin/Loratadine and Zyrtec/Cetrizine. 2. Avoid all combination including cold sinus allergies flu decongestant and sleep medications 3. You can use  Robitussin DM Mucinex and Mucinex DM for cough. 4. can use Tylenol aspirin ibuprofen and naproxen but no combinations such as sleep or sinus.    Flecainide tablets What is this medicine? FLECAINIDE (FLEK a nide) is an antiarrhythmic drug. This medicine is used to prevent irregular heart rhythm. It can also slow down fast heartbeats called tachycardia. This medicine may be used for other purposes; ask your health care provider or pharmacist if you have questions. COMMON BRAND NAME(S): Tambocor What should I tell my health care provider before I take this medicine? They need to know if you have any of these conditions: -abnormal levels of potassium in the blood -heart disease including heart rhythm and heart rate problems -kidney or liver disease -recent heart attack -an unusual or allergic reaction to flecainide, local anesthetics, other medicines, foods, dyes, or preservatives -pregnant or trying to get pregnant -breast-feeding How should I use this medicine? Take this medicine by mouth with a glass of water. Follow the directions on the prescription label. You can take this medicine with or without food. Take your doses at regular intervals. Do not take your medicine more often than directed. Do not stop taking this medicine suddenly. This may cause serious, heart-related side effects. If your doctor wants you to stop the medicine, the dose may be slowly lowered over time to avoid any side effects. Talk to your pediatrician regarding the use of this medicine in children. While this drug may be prescribed for children as young as 1 year of age for selected conditions, precautions do apply. Overdosage: If you think you  have taken too much of this medicine contact a poison control center or emergency room at once. NOTE: This medicine is only for you. Do not share this medicine with others. What if I miss a dose? If you miss a dose, take it as soon as you can. If it is almost time for your next  dose, take only that dose. Do not take double or extra doses. What may interact with this medicine? Do not take this medicine with any of the following medications: -amoxapine -arsenic trioxide -certain antibiotics like clarithromycin, erythromycin, gatifloxacin, gemifloxacin, levofloxacin, moxifloxacin, sparfloxacin, or troleandomycin -certain antidepressants called tricyclic antidepressants like amitriptyline, imipramine, or nortriptyline -certain medicines to control heart rhythm like disopyramide, dofetilide, encainide, moricizine, procainamide, propafenone, and quinidine -cisapride -cyclobenzaprine -delavirdine -droperidol -haloperidol -[place] -imatinib -levomethadyl -maprotiline -medicines for malaria like chloroquine and halofantrine -pentamidine -phenothiazines like chlorpromazine, mesoridazine, prochlorperazine, thioridazine -pimozide -quinine -ranolazine -ritonavir -sertindole -ziprasidone This medicine may also interact with the following medications: -cimetidine -medicines for angina or high blood pressure -medicines to control heart rhythm like amiodarone and digoxin This list may not describe all possible interactions. Give your health care provider a list of all the medicines, herbs, non-prescription drugs, or dietary supplements you use. Also tell them if you smoke, drink alcohol, or use illegal drugs. Some items may interact with your medicine. What should I watch for while using this medicine? Visit your doctor or health care professional for regular checks on your progress. Because your condition and the use of this medicine carries some risk, it is a good idea to carry an identification card, necklace or bracelet with details of your condition, medications and doctor or health care professional. Check your blood pressure and pulse rate regularly. Ask your health care professional what your blood pressure and pulse rate should be, and when you should contact him  or her. Your doctor or health care professional also may schedule regular blood tests and electrocardiograms to check your progress. You may get drowsy or dizzy. Do not drive, use machinery, or do anything that needs mental alertness until you know how this medicine affects you. Do not stand or sit up quickly, especially if you are an older patient. This reduces the risk of dizzy or fainting spells. Alcohol can make you more dizzy, increase flushing and rapid heartbeats. Avoid alcoholic drinks. What side effects may I notice from receiving this medicine? Side effects that you should report to your doctor or health care professional as soon as possible: -chest pain, continued irregular heartbeats -difficulty breathing -swelling of the legs or feet -trembling, shaking -unusually weak or tired Side effects that usually do not require medical attention (report to your doctor or health care professional if they continue or are bothersome): -blurred vision -constipation -headache -nausea, vomiting -stomach pain This list may not describe all possible side effects. Call your doctor for medical advice about side effects. You may report side effects to FDA at 1-800-FDA-1088. Where should I keep my medicine? Keep out of the reach of children. Store at room temperature between 15 and 30 degrees C (59 and 86 degrees F). Protect from light. Keep container tightly closed. Throw away any unused medicine after the expiration date. NOTE: This sheet is a summary. It may not cover all possible information. If you have questions about this medicine, talk to your doctor, pharmacist, or health care provider.  2018 Elsevier/Gold Standard (2007-12-04 16:46:09)

## 2018-07-05 NOTE — Telephone Encounter (Signed)
Prescriptions resent to [facility]. Patient notified.

## 2018-07-05 NOTE — Telephone Encounter (Signed)
Incoming call from pt regarding she's letting us know that she never had the transvag u/s because she doesn't feel comfortable with that and was told by scheduling that there needs to be an order for ultrasound that is not a transvaginal placed - pt reports she can not have the "wand" type.  Explained to her that they usually do that to get a more accurate u/s because on top of the belly can't see as well, pt said again she can't do the transvaginal.  I let her know if the order can be placed then she can call to be rescheduled. Pt voiced understanding and Per Joylene John NP , if pt still having pelvic pain then we can order the regular u/s not transvaginal, pt reports yes she is still having pelvic pain.  No other needs per pt at this time. Joylene John NP placed new order.    Outgoing call to pt to let her know that the new order was placed for pelvic ultrasound not transvag at [facility] -' just know it is not as accurate' per [facility] NP. No answer, left her VM with this information and our number and scheduling number if she wants to proceed with u/s so she can call to schedule, or included that she can call us back if she would like [place] to schedule it for her.

## 2018-07-06 DIAGNOSIS — Z91010 Allergy to peanuts: Secondary | ICD-10-CM | POA: Diagnosis not present

## 2018-07-06 DIAGNOSIS — E119 Type 2 diabetes mellitus without complications: Secondary | ICD-10-CM | POA: Diagnosis not present

## 2018-07-06 DIAGNOSIS — I471 Supraventricular tachycardia: Secondary | ICD-10-CM | POA: Diagnosis not present

## 2018-07-06 DIAGNOSIS — K219 Gastro-esophageal reflux disease without esophagitis: Secondary | ICD-10-CM | POA: Diagnosis not present

## 2018-07-06 DIAGNOSIS — Z881 Allergy status to other antibiotic agents status: Secondary | ICD-10-CM | POA: Diagnosis not present

## 2018-07-06 DIAGNOSIS — R079 Chest pain, unspecified: Secondary | ICD-10-CM | POA: Diagnosis not present

## 2018-07-06 DIAGNOSIS — Z87891 Personal history of nicotine dependence: Secondary | ICD-10-CM | POA: Diagnosis not present

## 2018-07-06 DIAGNOSIS — J069 Acute upper respiratory infection, unspecified: Secondary | ICD-10-CM | POA: Diagnosis not present

## 2018-07-06 DIAGNOSIS — G35 Multiple sclerosis: Secondary | ICD-10-CM | POA: Diagnosis not present

## 2018-07-06 DIAGNOSIS — R0602 Shortness of breath: Secondary | ICD-10-CM | POA: Diagnosis not present

## 2018-07-06 DIAGNOSIS — B9789 Other viral agents as the cause of diseases classified elsewhere: Secondary | ICD-10-CM | POA: Diagnosis not present

## 2018-07-06 DIAGNOSIS — E785 Hyperlipidemia, unspecified: Secondary | ICD-10-CM | POA: Diagnosis not present

## 2018-07-08 ENCOUNTER — Telehealth: Payer: Self-pay | Admitting: Family Medicine

## 2018-07-08 NOTE — Telephone Encounter (Signed)
Patient has been advised.  ,CMA  

## 2018-07-08 NOTE — Telephone Encounter (Signed)
MRI does show some mild tendinitis of the plantar fascia of the left foot.  No tear seen.  I will be able to tell you more once I see the images on the CD myself.  Please drop off the CD and schedule an appointment when able.

## 2018-07-08 NOTE — Telephone Encounter (Signed)
Call pt: catecholamine levels were normal on labs done at [facility]

## 2018-07-09 ENCOUNTER — Ambulatory Visit: Payer: Medicare HMO | Admitting: Family Medicine

## 2018-07-09 ENCOUNTER — Ambulatory Visit: Payer: Medicare HMO

## 2018-07-09 ENCOUNTER — Encounter (HOSPITAL_BASED_OUTPATIENT_CLINIC_OR_DEPARTMENT_OTHER): Payer: Self-pay

## 2018-07-09 ENCOUNTER — Telehealth: Payer: Self-pay | Admitting: Cardiology

## 2018-07-09 ENCOUNTER — Telehealth: Payer: Self-pay

## 2018-07-09 ENCOUNTER — Telehealth: Payer: Self-pay | Admitting: *Deleted

## 2018-07-09 ENCOUNTER — Emergency Department (HOSPITAL_BASED_OUTPATIENT_CLINIC_OR_DEPARTMENT_OTHER)
Admission: EM | Admit: 2018-07-09 | Discharge: 2018-07-09 | Disposition: A | Discharge status: Home / Self Care | Payer: Medicare HMO | Attending: Emergency Medicine | Admitting: Emergency Medicine

## 2018-07-09 DIAGNOSIS — Z87891 Personal history of nicotine dependence: Secondary | ICD-10-CM | POA: Insufficient documentation

## 2018-07-09 DIAGNOSIS — I10 Essential (primary) hypertension: Secondary | ICD-10-CM | POA: Insufficient documentation

## 2018-07-09 DIAGNOSIS — G35 Multiple sclerosis: Secondary | ICD-10-CM | POA: Insufficient documentation

## 2018-07-09 DIAGNOSIS — Z79899 Other long term (current) drug therapy: Secondary | ICD-10-CM | POA: Insufficient documentation

## 2018-07-09 DIAGNOSIS — J45909 Unspecified asthma, uncomplicated: Secondary | ICD-10-CM | POA: Diagnosis not present

## 2018-07-09 DIAGNOSIS — I491 Atrial premature depolarization: Secondary | ICD-10-CM

## 2018-07-09 DIAGNOSIS — R002 Palpitations: Secondary | ICD-10-CM | POA: Diagnosis not present

## 2018-07-09 DIAGNOSIS — I471 Supraventricular tachycardia: Secondary | ICD-10-CM

## 2018-07-09 DIAGNOSIS — R42 Dizziness and giddiness: Secondary | ICD-10-CM | POA: Diagnosis not present

## 2018-07-09 DIAGNOSIS — M542 Cervicalgia: Secondary | ICD-10-CM | POA: Insufficient documentation

## 2018-07-09 DIAGNOSIS — I493 Ventricular premature depolarization: Secondary | ICD-10-CM

## 2018-07-09 DIAGNOSIS — R5381 Other malaise: Secondary | ICD-10-CM | POA: Diagnosis not present

## 2018-07-09 DIAGNOSIS — R55 Syncope and collapse: Secondary | ICD-10-CM | POA: Diagnosis not present

## 2018-07-09 LAB — BASIC METABOLIC PANEL
Anion gap: 8 (ref 5–15)
BUN: 15 mg/dL (ref 6–20)
CO2: 24 mmol/L (ref 22–32)
Calcium: 8.8 mg/dL — ABNORMAL LOW (ref 8.9–10.3)
Chloride: 108 mmol/L (ref 98–111)
Creatinine, Ser: 0.56 mg/dL (ref 0.44–1.00)
GFR calc Af Amer: >60 mL/min (ref >60)
GFR calc non Af Amer: >60 mL/min (ref >60)
Glucose, Bld: 107 mg/dL — ABNORMAL HIGH (ref 70–99)
Potassium: 3.7 mmol/L (ref 3.5–5.1)
Sodium: 140 mmol/L (ref 135–145)

## 2018-07-09 LAB — CBC WITH DIFFERENTIAL/PLATELET
Abs Immature Granulocytes: 0.04 10*3/uL (ref 0.00–0.07)
Basophils Absolute: 0 10*3/uL (ref 0.0–0.1)
Basophils Relative: 1 %
Eosinophils Absolute: 0.2 10*3/uL (ref 0.0–0.5)
Eosinophils Relative: 2 %
HCT: 45.3 % (ref 36.0–46.0)
Hemoglobin: 14.3 g/dL (ref 12.0–15.0)
Immature Granulocytes: 1 %
Lymphocytes Relative: 23 %
Lymphs Abs: 1.7 10*3/uL (ref 0.7–4.0)
MCH: 29 pg (ref 26.0–34.0)
MCHC: 31.6 g/dL (ref 30.0–36.0)
MCV: 91.9 fL (ref 80.0–100.0)
Monocytes Absolute: 0.8 10*3/uL (ref 0.1–1.0)
Monocytes Relative: 10 %
Neutro Abs: 4.9 10*3/uL (ref 1.7–7.7)
Neutrophils Relative %: 63 %
Platelets: 212 10*3/uL (ref 150–400)
RBC: 4.93 MIL/uL (ref 3.87–5.11)
RDW: 13.9 % (ref 11.5–15.5)
WBC: 7.6 10*3/uL (ref 4.0–10.5)
nRBC: 0 % (ref 0.0–0.2)

## 2018-07-09 MED ORDER — SODIUM CHLORIDE 0.9 % IV BOLUS
1000.0000 mL | Freq: Once | INTRAVENOUS | Status: AC
  Administered 2018-07-09: 1000 mL via INTRAVENOUS

## 2018-07-09 MED ORDER — DICLOFENAC SODIUM 1 % TD GEL: 2.0000 g | Freq: Four times a day (QID) | TRANSDERMAL | 0 refills | Status: DC

## 2018-07-09 NOTE — Telephone Encounter (Signed)
BP is 160/102, pulse 93, oxygen 98

## 2018-07-09 NOTE — Telephone Encounter (Signed)
Electrophysiology referral has been placed. Will have front desk in [facility] schedule patient for next available appointment with Dr. Curt Bears.

## 2018-07-09 NOTE — Telephone Encounter (Signed)
Really do not think the metoprolol is increasing her blood pressure.  She is been taking it for a while and doing great on it overall.  These verify how much she took this morning.  Is she watching her salt?  She drinking plenty of water?  Eyes have her come in for nurse visit here if she would like [place] to check it.

## 2018-07-09 NOTE — ED Notes (Signed)
ED Provider at bedside. 

## 2018-07-09 NOTE — ED Triage Notes (Signed)
Pt c/o feeling funny in her head, states placed on a cardiac monitor this am but they wouldn't see her states needs to see her PCP; pt thinks her b/p is going up and down from her new meds
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
## 2018-07-09 NOTE — Telephone Encounter (Signed)
Dr. Bettina Gavia has been made aware that patient has not started flecainide and he recommends patient patient follow up with her PCP for hypertension management.

## 2018-07-09 NOTE — Addendum Note (Signed)
Addended by: Austin Miles on: 07/09/2018 11:56 AM   Modules accepted: Orders

## 2018-07-09 NOTE — ED Notes (Signed)
NAD at this time. Pt is stable and going home.  

## 2018-07-09 NOTE — ED Notes (Signed)
Pt laying down for 10 minutes before orthostatics

## 2018-07-09 NOTE — Discharge Instructions (Addendum)
Please try heating pads and lidocaine patches on the neck. These are over the counter Take Tylenol for pain  Please follow up with your doctor

## 2018-07-09 NOTE — Telephone Encounter (Signed)
Called pt- no ans and no VM set up

## 2018-07-09 NOTE — Telephone Encounter (Signed)
Pt was advised during phone call today by Shaune Pascal, [place].

## 2018-07-09 NOTE — Telephone Encounter (Signed)
Attempted pt again, unable to reach. It does appear pt was evaluated today at [facility] to Dr Madilyn Fireman

## 2018-07-09 NOTE — Telephone Encounter (Signed)
Jennifer [name] called and states the Metoprolol is increasing her blood pressure. She would like a different medication. She is very worried because her bottom number in over 100. She states she may need to EMS later if she starts to feel worse.

## 2018-07-09 NOTE — Telephone Encounter (Signed)
Pt was seen today for a 14 day Zio. She was hesitant about monitor due to having one the end of September but decided she would go ahead with the monitor. Placed monitor on pt and rescheduled her appt with Dr. Bettina Gavia so that results of monitor would be back in time for Dr. Bettina Gavia to review. Pt also wants to go ahead and get a referral to EP Dr. For heart arrhythmia's. Please put in referral now since these appt's are into Feb. At this time. Thank you.

## 2018-07-09 NOTE — Telephone Encounter (Signed)
Patient called stating "I had asked the [place] scan be changed from transvaginal to just the tummy thinking I wouldn't have to hold my bladder. But the scan people are saying I still with have to hold my bladder. I can't do that anymore. I can't have anymore CT done to having to much radiation in the past. Can I have a MRI instead; would that work." Per Lenna Sciara APP, we will aks Dr. Gerarda Fraction tomorrow. Patient given message and verbalized understanding

## 2018-07-09 NOTE — Telephone Encounter (Signed)
Please call patient regarding flecainide, patient had a monitor at [facility] so questioning why needing another one now. She is in our office now so please call as soon as possible. Notes look like doctor wanted her taking flecainide prior to having monitor done.

## 2018-07-10 ENCOUNTER — Telehealth: Payer: Self-pay | Admitting: *Deleted

## 2018-07-10 NOTE — Telephone Encounter (Signed)
Patient returned my call from this morning. Per Dr. Gerarda Fraction "patient can have an empty bladder for the TVUS scan, but she will be happy to order MRI. Not sure if insurance will cover MRI." Explained this to the patient. The patient stated "I do not want an internal [place] scan at all, please order the MRI. What will be the plan after the MRI. If the results are ok, will she release me; or do I have to wait the six months to see her back? Can I see her after the scan to go over the results. Does she really need to see me in the office again one more time." I explained that "Dr. Gerarda Fraction will want to see you one more time in the office, it can probably be earlier than six months, I will ask her." Patient verbalized understanding. Explained to the patient that I will schedule the MRI and call her back soon.

## 2018-07-15 ENCOUNTER — Ambulatory Visit (INDEPENDENT_AMBULATORY_CARE_PROVIDER_SITE_OTHER): Payer: Medicare HMO | Admitting: Family Medicine

## 2018-07-15 ENCOUNTER — Encounter: Payer: Self-pay | Admitting: Family Medicine

## 2018-07-15 ENCOUNTER — Telehealth: Payer: Self-pay | Admitting: *Deleted

## 2018-07-15 ENCOUNTER — Other Ambulatory Visit: Payer: Self-pay | Admitting: Obstetrics

## 2018-07-15 VITALS — BP 131/74 | HR 91 | Ht 62.0 in | Wt 225.0 lb

## 2018-07-15 DIAGNOSIS — M79672 Pain in left foot: Secondary | ICD-10-CM | POA: Diagnosis not present

## 2018-07-15 DIAGNOSIS — H9202 Otalgia, left ear: Secondary | ICD-10-CM

## 2018-07-15 DIAGNOSIS — I10 Essential (primary) hypertension: Secondary | ICD-10-CM | POA: Diagnosis not present

## 2018-07-15 DIAGNOSIS — K2100 Gastro-esophageal reflux disease with esophagitis, without bleeding: Secondary | ICD-10-CM

## 2018-07-15 DIAGNOSIS — E611 Iron deficiency: Secondary | ICD-10-CM | POA: Diagnosis not present

## 2018-07-15 DIAGNOSIS — M79671 Pain in right foot: Secondary | ICD-10-CM | POA: Diagnosis not present

## 2018-07-15 DIAGNOSIS — R102 Pelvic and perineal pain: Secondary | ICD-10-CM

## 2018-07-15 DIAGNOSIS — E876 Hypokalemia: Secondary | ICD-10-CM | POA: Diagnosis not present

## 2018-07-15 DIAGNOSIS — L739 Follicular disorder, unspecified: Secondary | ICD-10-CM

## 2018-07-15 LAB — BASIC METABOLIC PANEL
BUN: 13 (ref 4–21)
Creatinine: 0.6 (ref 0.5–1.1)
Glucose: 97
Potassium: 3.9 (ref 3.4–5.3)
Sodium: 141 (ref 137–147)

## 2018-07-15 MED ORDER — MUPIROCIN 2 % EX OINT: TOPICAL_OINTMENT | Freq: Three times a day (TID) | CUTANEOUS | 0 refills | Status: AC

## 2018-07-15 NOTE — Progress Notes (Signed)
Subjective:    CC:   HPI:  52 year old female comes in today complaining of some chest pain last evening.  She said she was not sure if it was reflux areas she is actually feeling a little better this morning.  Also recently saw cardiology and they recommended that she start flecainide.  She read the black box warning and decided against taking it.  She actually thinks she may have tried it years ago but really cannot remember.  She still would really like to consider changing her metoprolol to a different beta-blocker.  She remembers labetalol working well but it caused shortness of breath but is open to trying a different one.  She is also still complaining of her left ear.  It just feels full.  She feels like it is clogged.  She has not noticed any drainage.  She still had a lot of postnasal drip and intermittent nasal congestion.  She still using her nasal steroid spray most days, but has not used it in a couple of days.  Still just feels a little off balance and a most dizzy at times.  Has an inflamed follicle in her right groin area.  She says it started to get a little sore and irritated.  He thinks it was from shaving.   Past medical history, Surgical history, Family history not pertinant except as noted below, Social history, Allergies, and medications have been entered into the medical record, reviewed, and corrections made.   Review of Systems: No fevers, chills, night sweats, weight loss, chest pain, or shortness of breath.   Objective:    General: Well Developed, well nourished, and in no acute distress.  Neuro: Alert and oriented x3, extra-ocular muscles intact, sensation grossly intact.  HEENT: Normocephalic, atraumatic TM appears to be a little retracted compared to the right which appears normal.  No erythema or fluid. Skin: Warm and dry, no rashes. Cardiac: Regular rate and rhythm, no murmurs rubs or gallops, no lower extremity edema.  Respiratory: Clear to auscultation  bilaterally. Not using accessory muscles, speaking in full sentences.   Impression and Recommendations:    GERD-make sure taking Pepcid regularly.  OKTo use Carafate as needed.  Left ear fullness-her left TM looks a little retracted and with tympanometry she had high peak height with both ears which can indicate a copy tympanic membrane or ossicle disruption.  Hypertension-blood pressure actually looks really great today.  I do not think changing the metoprolol is unreasonable but encouraged her to call her new cardiologist, Dr. Bettina Gavia to see if he has any recommendations first.  Folliculitis-recommend treat with warm compresses and will give her some mupirocin appointment to apply 3 times a day.  Time spent 45 minutes, greater than 50% of time spent face-to-face counseling about reflux, left ear fullness, hypertension, and folliculitis.

## 2018-07-15 NOTE — Telephone Encounter (Signed)
Called and left the patient a message to call the office, needs to be scheduled for a MRI

## 2018-07-15 NOTE — Patient Instructions (Signed)
Recommend using her nasal steroid spray daily.  2 squirts in each nostril daily for a couple of weeks. Continue with your nasal saline irrigation.

## 2018-07-15 NOTE — Patient Instructions (Signed)
Thank you for coming in today. You should hear about Neurology EMG soon.   [facility] at [phone] and [phone]  Follow up with the foot doctor.   Let me know what people say.
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
## 2018-07-16 ENCOUNTER — Telehealth: Payer: Self-pay | Admitting: *Deleted

## 2018-07-16 NOTE — Telephone Encounter (Addendum)
Patient called yesterday and requested that her MRI be at [facility] the patient that we would discuss the request with Dr. Gerarda Fraction and call her after the scan was approved to schedule the scan. Patient request that after the scan is approved we let her call to schedule the scan.

## 2018-07-17 DIAGNOSIS — S6991XA Unspecified injury of right wrist, hand and finger(s), initial encounter: Secondary | ICD-10-CM | POA: Diagnosis not present

## 2018-07-17 DIAGNOSIS — R918 Other nonspecific abnormal finding of lung field: Secondary | ICD-10-CM | POA: Diagnosis not present

## 2018-07-17 DIAGNOSIS — M7989 Other specified soft tissue disorders: Secondary | ICD-10-CM | POA: Diagnosis not present

## 2018-07-17 DIAGNOSIS — R05 Cough: Secondary | ICD-10-CM | POA: Diagnosis not present

## 2018-07-17 DIAGNOSIS — Z87891 Personal history of nicotine dependence: Secondary | ICD-10-CM | POA: Diagnosis not present

## 2018-07-17 DIAGNOSIS — M79641 Pain in right hand: Secondary | ICD-10-CM | POA: Diagnosis not present

## 2018-07-18 ENCOUNTER — Telehealth: Payer: Self-pay | Admitting: *Deleted

## 2018-07-18 NOTE — Telephone Encounter (Signed)
Fax MRI order to [facility] at [phone]. Called and notified the patient, ask that she call us with the appt date/time to follow the results

## 2018-07-19 DIAGNOSIS — R002 Palpitations: Secondary | ICD-10-CM | POA: Diagnosis not present

## 2018-07-22 ENCOUNTER — Ambulatory Visit: Payer: Self-pay | Admitting: Cardiology

## 2018-07-23 ENCOUNTER — Telehealth: Payer: Self-pay | Admitting: *Deleted

## 2018-07-23 DIAGNOSIS — G35 Multiple sclerosis: Secondary | ICD-10-CM | POA: Diagnosis not present

## 2018-07-23 DIAGNOSIS — J45909 Unspecified asthma, uncomplicated: Secondary | ICD-10-CM | POA: Diagnosis not present

## 2018-07-23 DIAGNOSIS — R42 Dizziness and giddiness: Secondary | ICD-10-CM | POA: Diagnosis not present

## 2018-07-23 DIAGNOSIS — K219 Gastro-esophageal reflux disease without esophagitis: Secondary | ICD-10-CM | POA: Diagnosis not present

## 2018-07-23 DIAGNOSIS — E785 Hyperlipidemia, unspecified: Secondary | ICD-10-CM | POA: Diagnosis not present

## 2018-07-23 DIAGNOSIS — Z8542 Personal history of malignant neoplasm of other parts of uterus: Secondary | ICD-10-CM | POA: Diagnosis not present

## 2018-07-23 DIAGNOSIS — I10 Essential (primary) hypertension: Secondary | ICD-10-CM | POA: Diagnosis not present

## 2018-07-23 DIAGNOSIS — R569 Unspecified convulsions: Secondary | ICD-10-CM | POA: Diagnosis not present

## 2018-07-23 DIAGNOSIS — R05 Cough: Secondary | ICD-10-CM | POA: Diagnosis not present

## 2018-07-23 DIAGNOSIS — R0602 Shortness of breath: Secondary | ICD-10-CM | POA: Diagnosis not present

## 2018-07-23 DIAGNOSIS — G4730 Sleep apnea, unspecified: Secondary | ICD-10-CM | POA: Diagnosis not present

## 2018-07-23 DIAGNOSIS — E119 Type 2 diabetes mellitus without complications: Secondary | ICD-10-CM | POA: Diagnosis not present

## 2018-07-23 DIAGNOSIS — R0600 Dyspnea, unspecified: Secondary | ICD-10-CM | POA: Diagnosis not present

## 2018-07-23 NOTE — Telephone Encounter (Signed)
Called and left the patient a message to call the office back. Need to see if the patient has scheduled her MRI at [facility]

## 2018-07-24 DIAGNOSIS — G501 Atypical facial pain: Secondary | ICD-10-CM | POA: Insufficient documentation

## 2018-07-24 DIAGNOSIS — G4733 Obstructive sleep apnea (adult) (pediatric): Secondary | ICD-10-CM | POA: Diagnosis not present

## 2018-07-24 DIAGNOSIS — R569 Unspecified convulsions: Secondary | ICD-10-CM | POA: Diagnosis not present

## 2018-07-24 DIAGNOSIS — M79671 Pain in right foot: Secondary | ICD-10-CM | POA: Insufficient documentation

## 2018-07-24 DIAGNOSIS — R69 Illness, unspecified: Secondary | ICD-10-CM | POA: Diagnosis not present

## 2018-07-24 DIAGNOSIS — D320 Benign neoplasm of cerebral meninges: Secondary | ICD-10-CM | POA: Diagnosis not present

## 2018-07-24 DIAGNOSIS — R200 Anesthesia of skin: Secondary | ICD-10-CM | POA: Diagnosis not present

## 2018-07-24 DIAGNOSIS — R202 Paresthesia of skin: Secondary | ICD-10-CM | POA: Diagnosis not present

## 2018-07-24 DIAGNOSIS — M79672 Pain in left foot: Secondary | ICD-10-CM | POA: Diagnosis not present

## 2018-07-26 ENCOUNTER — Telehealth: Payer: Self-pay | Admitting: *Deleted

## 2018-07-26 NOTE — Telephone Encounter (Signed)
Called and spoke with the patient, she has scheduled her MRI at [facility] for 12/17

## 2018-07-27 IMAGING — DX DG ABDOMEN ACUTE W/ 1V CHEST
4 series · 4 of 4 positions shown · non-contrast
Comparison: None.

CLINICAL DATA: Abdominal pain, nausea, vomiting

EXAM:
DG ABDOMEN ACUTE W/ 1V CHEST

[chest pa]
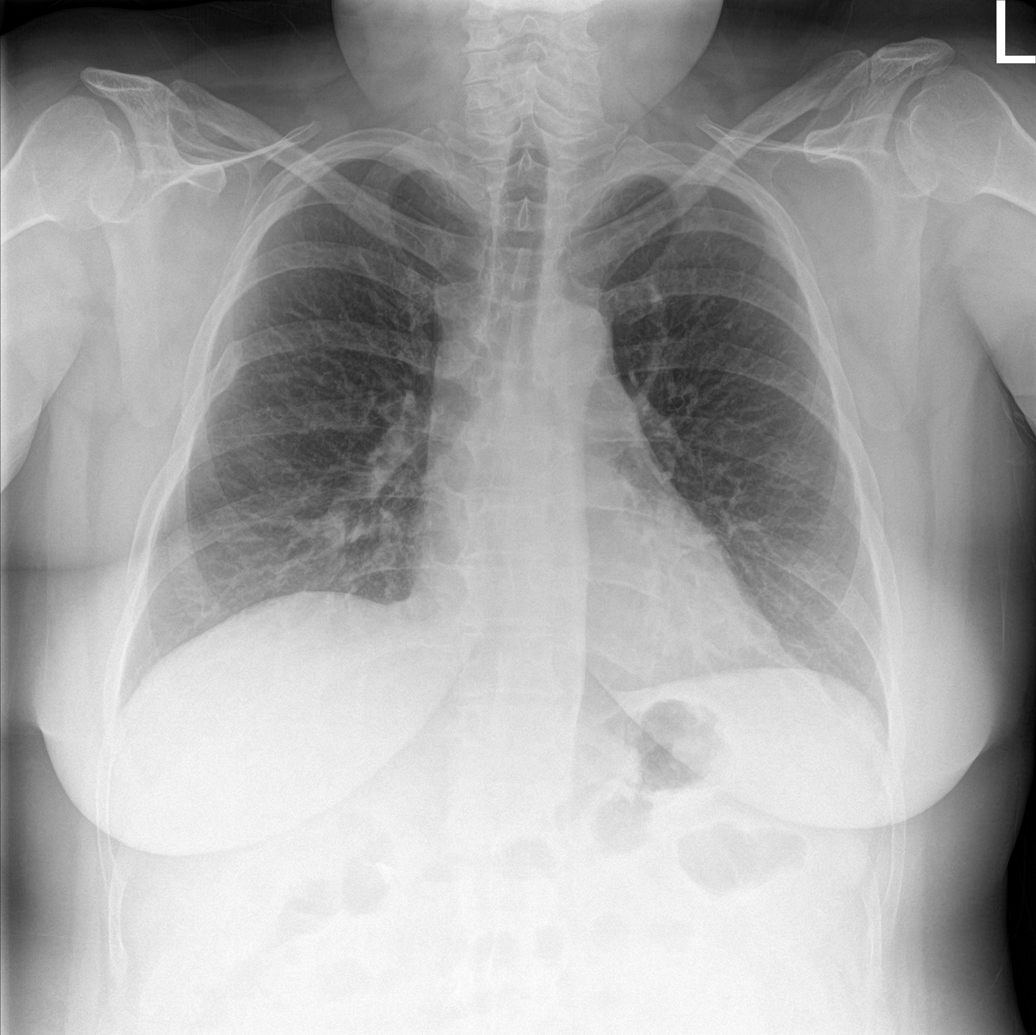

[abdomen erect]
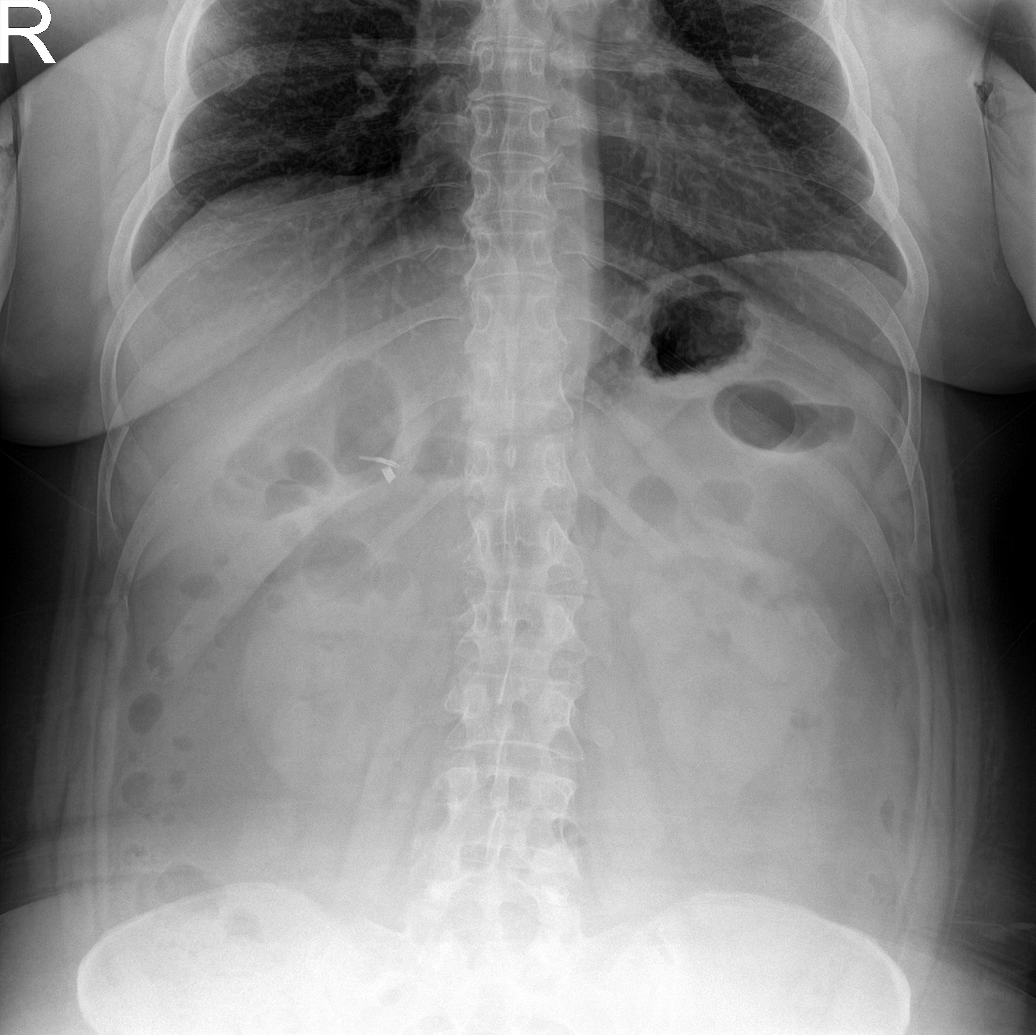

[abdomen supine (1 of 2)]
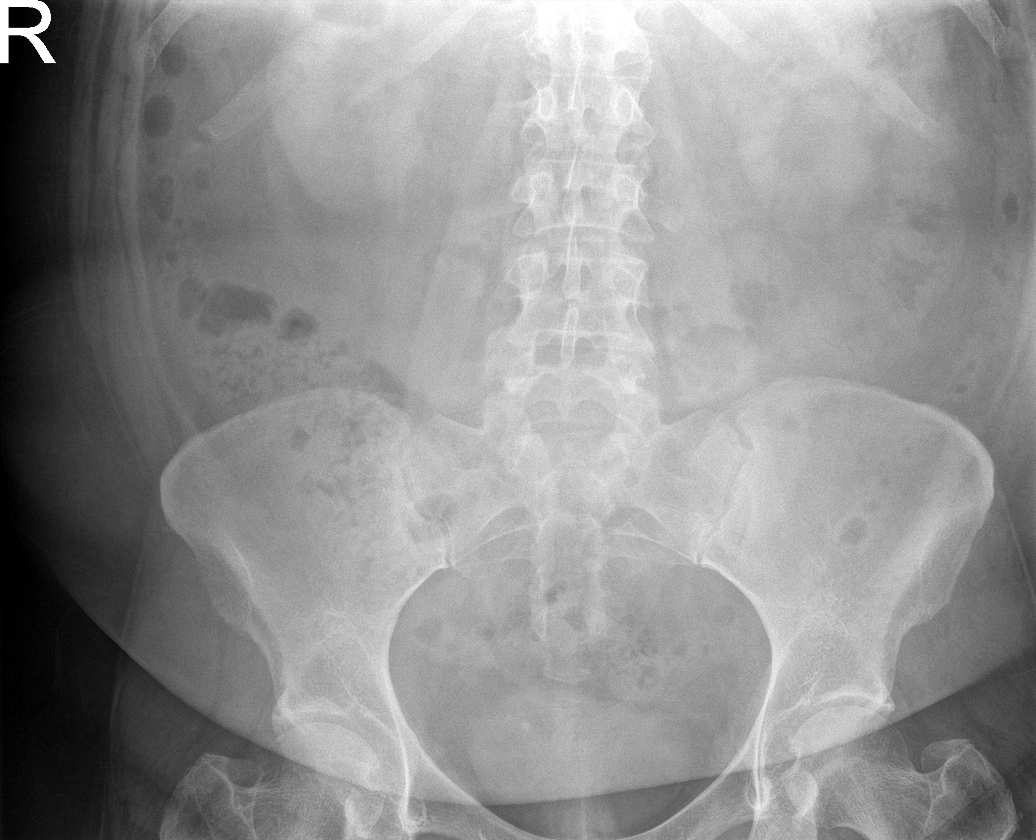

[abdomen supine (2 of 2)]
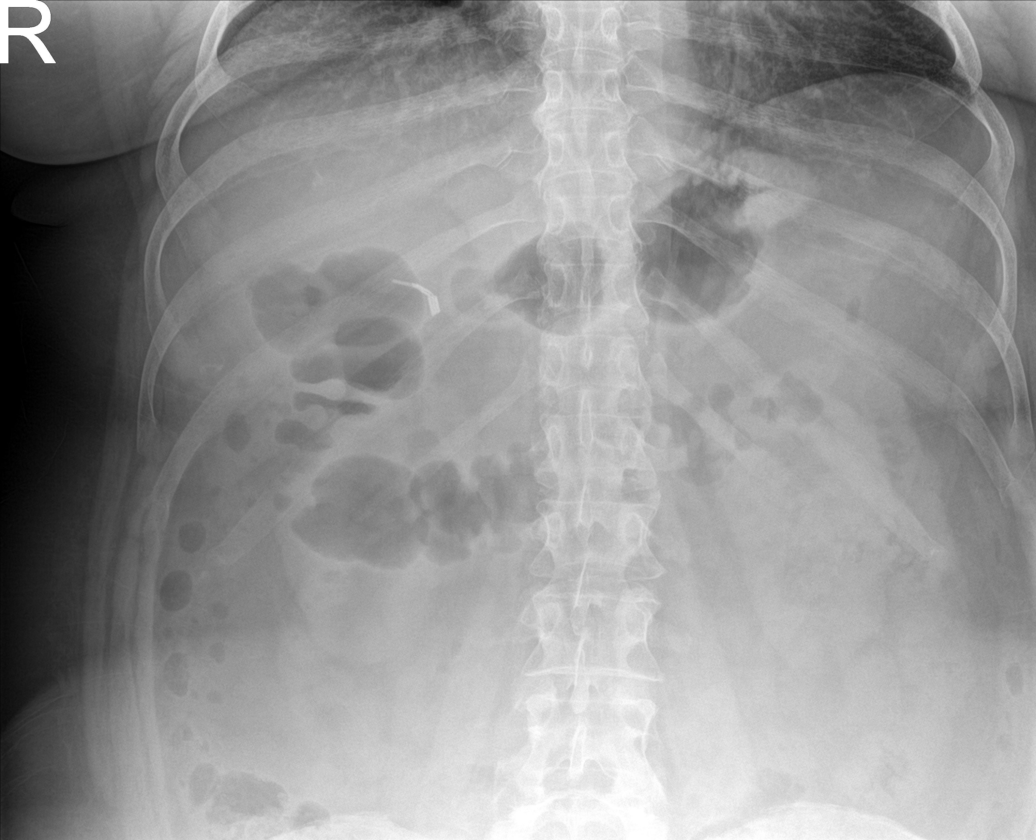

[4 of 4 positions shown; findings below may reference images not displayed]

FINDINGS: There is no evidence of dilated bowel loops or free intraperitoneal
air. No radiopaque calculi or other significant radiographic
abnormality is seen. Heart size and mediastinal contours are within
normal limits. Both lungs are clear.
IMPRESSION: Negative abdominal radiographs.  No acute cardiopulmonary disease.

## 2018-07-29 DIAGNOSIS — B9789 Other viral agents as the cause of diseases classified elsewhere: Secondary | ICD-10-CM | POA: Diagnosis not present

## 2018-07-29 DIAGNOSIS — R0981 Nasal congestion: Secondary | ICD-10-CM | POA: Diagnosis not present

## 2018-07-29 DIAGNOSIS — R05 Cough: Secondary | ICD-10-CM | POA: Diagnosis not present

## 2018-07-29 DIAGNOSIS — R0602 Shortness of breath: Secondary | ICD-10-CM | POA: Diagnosis not present

## 2018-07-29 DIAGNOSIS — J069 Acute upper respiratory infection, unspecified: Secondary | ICD-10-CM | POA: Diagnosis not present

## 2018-07-29 DIAGNOSIS — M545 Low back pain: Secondary | ICD-10-CM | POA: Diagnosis not present

## 2018-07-30 ENCOUNTER — Telehealth: Payer: Self-pay

## 2018-07-30 DIAGNOSIS — R102 Pelvic and perineal pain: Secondary | ICD-10-CM | POA: Diagnosis not present

## 2018-07-30 DIAGNOSIS — E876 Hypokalemia: Secondary | ICD-10-CM | POA: Diagnosis not present

## 2018-07-30 DIAGNOSIS — K5730 Diverticulosis of large intestine without perforation or abscess without bleeding: Secondary | ICD-10-CM | POA: Diagnosis not present

## 2018-07-30 LAB — BASIC METABOLIC PANEL
BUN: 14 (ref 4–21)
Creatinine: 0.8 (ref 0.5–1.1)
Glucose: 147
Potassium: 4 (ref 3.4–5.3)
Sodium: 141 (ref 137–147)

## 2018-07-30 NOTE — Telephone Encounter (Signed)
Outgoing call to pt per Joylene John NP regarding results of MRI 07/30/2018, "looks good, no acute findings, mild diverticulosis not "ititis"  Explained difference between two to pt.  Voiced understanding.  Pt asked about recommended f/u with Dr Gerarda Fraction.  Per notes and Melissa NP would be in March 2020.  Provided several appt possibilities but pt prefers a Friday appt in March, not available so pt said she would prefer April 3rd appt Friday, 930 am instead of March.  No other needs per her at this time.

## 2018-07-31 ENCOUNTER — Emergency Department (HOSPITAL_BASED_OUTPATIENT_CLINIC_OR_DEPARTMENT_OTHER)
Admission: EM | Admit: 2018-07-31 | Discharge: 2018-07-31 | Disposition: A | Discharge status: Home / Self Care | Payer: Medicare HMO | Attending: Emergency Medicine | Admitting: Emergency Medicine

## 2018-07-31 ENCOUNTER — Other Ambulatory Visit: Payer: Self-pay

## 2018-07-31 ENCOUNTER — Encounter: Payer: Self-pay | Admitting: Family Medicine

## 2018-07-31 ENCOUNTER — Emergency Department (HOSPITAL_BASED_OUTPATIENT_CLINIC_OR_DEPARTMENT_OTHER): Payer: Medicare HMO

## 2018-07-31 ENCOUNTER — Encounter (HOSPITAL_BASED_OUTPATIENT_CLINIC_OR_DEPARTMENT_OTHER): Payer: Self-pay

## 2018-07-31 DIAGNOSIS — J4530 Mild persistent asthma, uncomplicated: Secondary | ICD-10-CM | POA: Insufficient documentation

## 2018-07-31 DIAGNOSIS — Z87891 Personal history of nicotine dependence: Secondary | ICD-10-CM | POA: Insufficient documentation

## 2018-07-31 DIAGNOSIS — R0602 Shortness of breath: Secondary | ICD-10-CM | POA: Diagnosis not present

## 2018-07-31 DIAGNOSIS — I10 Essential (primary) hypertension: Secondary | ICD-10-CM | POA: Diagnosis not present

## 2018-07-31 DIAGNOSIS — R05 Cough: Secondary | ICD-10-CM | POA: Diagnosis not present

## 2018-07-31 DIAGNOSIS — R0981 Nasal congestion: Secondary | ICD-10-CM | POA: Diagnosis not present

## 2018-07-31 DIAGNOSIS — Z79899 Other long term (current) drug therapy: Secondary | ICD-10-CM | POA: Diagnosis not present

## 2018-07-31 DIAGNOSIS — R059 Cough, unspecified: Secondary | ICD-10-CM

## 2018-07-31 DIAGNOSIS — R0982 Postnasal drip: Secondary | ICD-10-CM | POA: Diagnosis not present
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
## 2018-07-31 NOTE — ED Triage Notes (Signed)
C/o flu like sx x 3 days-NAD-steady gait

## 2018-08-02 ENCOUNTER — Encounter: Payer: Self-pay | Admitting: Family Medicine

## 2018-08-02 ENCOUNTER — Ambulatory Visit (INDEPENDENT_AMBULATORY_CARE_PROVIDER_SITE_OTHER): Payer: Medicare HMO | Admitting: Family Medicine

## 2018-08-02 VITALS — BP 149/79 | HR 80 | Ht 62.0 in | Wt 224.0 lb

## 2018-08-02 DIAGNOSIS — L739 Follicular disorder, unspecified: Secondary | ICD-10-CM

## 2018-08-02 DIAGNOSIS — I10 Essential (primary) hypertension: Secondary | ICD-10-CM

## 2018-08-02 DIAGNOSIS — R42 Dizziness and giddiness: Secondary | ICD-10-CM | POA: Diagnosis not present

## 2018-08-02 DIAGNOSIS — R2242 Localized swelling, mass and lump, left lower limb: Secondary | ICD-10-CM

## 2018-08-02 DIAGNOSIS — R0789 Other chest pain: Secondary | ICD-10-CM

## 2018-08-02 DIAGNOSIS — M7989 Other specified soft tissue disorders: Secondary | ICD-10-CM

## 2018-08-02 NOTE — Patient Instructions (Signed)
Please take your spironolactone for the next couple of days to try to get some extra fluid off and help with the swelling and to lower your blood pressure.  Okay to hold your potassium on those days.  May want to try taking the spironolactone every third day or even every other day if you are able to tolerate it.

## 2018-08-02 NOTE — Progress Notes (Signed)
Subjective:    CC: axillar hurting and some upper back pain.   HPI:  Hypertension-she has been taking her metoprolol but has not been using her spironolactone.  She reports that she has felt short of breath infected so she went to the emergency department earlier this week.  She noticed that with activity and with lying on her right side she was feeling a little short of breath but with her wrist she felt fine.  He is also noticed a little bit of swelling around her left lower leg.  [place] because she is been having some bilateral chest pain that is been going into her upper back bilaterally.  Since she had contrast recently with an imaging study she is been having temporal headaches bilaterally.  She says she just felt bad and sick right afterwards for couple of days and just has felt really tired since then.  In general she has felt more achy all over.  In fact for the last couple of weeks she has been in bed a lot.  She is having recurrent right arm swelling and needs a new referral to the lymphedema clinic.  States he says it is now swollen all the way down to her wrist.  She is also had a little bit of pain in her left axilla as well.  Fact she says 1 day it felt so swollen she had a hard time reaching behind her.  Warm heat on it did make it feel a little better.  She feels like she is still having sinus symptoms.  She thinks she might need another antibiotic.  She was actually just seen in the emergency department 2 days ago for sinus congestion cough and postnasal drip.  No fevers chills or sweats.  Still continues to have some intermittent dizziness and lightheadedness.  She just feels like her gait is off.  Unfortunately she has had episodes of this on and off over several years.  Also has a lesion in her right groin area over the mons pubis.  She says it was quite swollen and irritated and tender but it has gone down significantly but she still has a little firmness underneath the  skin.  Past medical history, Surgical history, Family history not pertinant except as noted below, Social history, Allergies, and medications have been entered into the medical record, reviewed, and corrections made.   Review of Systems: No fevers, chills, night sweats, weight loss, chest pain, or shortness of breath.   Objective:    General: Well Developed, well nourished, and in no acute distress.  Neuro: Alert and oriented x3, extra-ocular muscles intact, sensation grossly intact.  HEENT: Normocephalic, atraumatic oropharynx clear, TMs and canals are clear bilaterally.  No significant cervical lymphadenopathy. Skin: Warm and dry, no rashes.  He does have what was likely an inflamed follicle or possible sebaceous cyst over the right side of the mons pubis. Cardiac: Regular rate and rhythm, no murmurs rubs or gallops, no lower extremity edema.  Respiratory: Clear to auscultation bilaterally. Not using accessory muscles, speaking in full sentences. MSK: Trace pitting edema over the left lower tibia.   Impression and Recommendations:   Hypertension-  Left lower extremity swelling-recommend that she take her spironolactone for the next couple of days.  She does have some trace edema on the left but none on the right.  Right arm swelling-she is also had this on and off intermittently for several years it will start in the axilla and then feel like it is  getting down the arm.  We will be happy placed in a referral back to the lymphedema clinic.  Headaches-unclear if related to the contrast or not that does seem to be when they were triggered but explained that if it is from the contrast there really is not a lot that I can do about it she will just have to give it some time.  Dizziness-think this is still some of the same intermittent dizziness that she is been experiencing previously.  Blood pressures actually little elevated today.  Actually think doing her spironolactone for a few days which  will help remove some fluid and decrease her pressure would be helpful.  She also may be getting little sinus pressure which could be contributing to the dizziness.  But I do not think there is anything worrisome at this point.  Shortness of breath-she is able to speak with any difficulty.  Pulse ox is normal and reassuring.  Chest exam is clear she also had a chest x-ray done in emergency department which is also reassuring.  May be related to the upper respiratory symptoms that she is been experiencing.  Sinusitis-she continues to have some congestion postnasal drip and intermittent cough.  She says is not persistent but will come in spurts.  Certainly if it gets worse over the holidays and okay to start prescription for antibiotics.  Atypical chest pain -I do not think it is cardiac in nature.  EKG today shows rate of 73 bpm, normal sinus rhythm with no acute changes.  Gave reassurance.  Folliculitis versus inflamed sebaceous cyst-since it seems to be going better on its own just encouraged her to continue to monitor it for the next week or 2 to see if it completely goes away if not then it may be a sebaceous cyst that needs to be excised.  Spent 45 minutes, range 50% of time spent counseling about arm swelling, hypertension, dizziness, lower extremity swelling, atypical chest pain, folliculitis.

## 2018-08-08 DIAGNOSIS — M25511 Pain in right shoulder: Secondary | ICD-10-CM | POA: Diagnosis not present

## 2018-08-08 DIAGNOSIS — Y999 Unspecified external cause status: Secondary | ICD-10-CM | POA: Diagnosis not present

## 2018-08-08 DIAGNOSIS — M24111 Other articular cartilage disorders, right shoulder: Secondary | ICD-10-CM | POA: Diagnosis not present

## 2018-08-08 DIAGNOSIS — X509XXA Other and unspecified overexertion or strenuous movements or postures, initial encounter: Secondary | ICD-10-CM | POA: Diagnosis not present

## 2018-08-09 ENCOUNTER — Ambulatory Visit: Payer: Self-pay | Admitting: Cardiology

## 2018-08-09 DIAGNOSIS — M19011 Primary osteoarthritis, right shoulder: Secondary | ICD-10-CM | POA: Diagnosis not present

## 2018-08-09 DIAGNOSIS — M7551 Bursitis of right shoulder: Secondary | ICD-10-CM | POA: Diagnosis not present

## 2018-08-09 DIAGNOSIS — M792 Neuralgia and neuritis, unspecified: Secondary | ICD-10-CM | POA: Diagnosis not present

## 2018-08-12 DIAGNOSIS — M79672 Pain in left foot: Secondary | ICD-10-CM | POA: Diagnosis not present

## 2018-08-12 DIAGNOSIS — M19011 Primary osteoarthritis, right shoulder: Secondary | ICD-10-CM | POA: Insufficient documentation

## 2018-08-12 DIAGNOSIS — M79671 Pain in right foot: Secondary | ICD-10-CM | POA: Diagnosis not present

## 2018-08-12 DIAGNOSIS — M7551 Bursitis of right shoulder: Secondary | ICD-10-CM | POA: Insufficient documentation

## 2018-08-12 DIAGNOSIS — M792 Neuralgia and neuritis, unspecified: Secondary | ICD-10-CM | POA: Insufficient documentation

## 2018-08-13 ENCOUNTER — Ambulatory Visit (INDEPENDENT_AMBULATORY_CARE_PROVIDER_SITE_OTHER): Payer: Medicare HMO | Admitting: Family Medicine

## 2018-08-13 ENCOUNTER — Encounter: Payer: Self-pay | Admitting: Family Medicine

## 2018-08-13 VITALS — HR 89 | Ht 62.0 in | Wt 221.0 lb

## 2018-08-13 DIAGNOSIS — E876 Hypokalemia: Secondary | ICD-10-CM | POA: Diagnosis not present

## 2018-08-13 DIAGNOSIS — M7551 Bursitis of right shoulder: Secondary | ICD-10-CM | POA: Diagnosis not present

## 2018-08-13 DIAGNOSIS — M7989 Other specified soft tissue disorders: Secondary | ICD-10-CM | POA: Diagnosis not present

## 2018-08-13 DIAGNOSIS — R7301 Impaired fasting glucose: Secondary | ICD-10-CM

## 2018-08-13 DIAGNOSIS — M792 Neuralgia and neuritis, unspecified: Secondary | ICD-10-CM | POA: Diagnosis not present

## 2018-08-13 DIAGNOSIS — R7309 Other abnormal glucose: Secondary | ICD-10-CM | POA: Diagnosis not present

## 2018-08-13 DIAGNOSIS — I10 Essential (primary) hypertension: Secondary | ICD-10-CM | POA: Diagnosis not present

## 2018-08-13 DIAGNOSIS — R0789 Other chest pain: Secondary | ICD-10-CM

## 2018-08-13 DIAGNOSIS — M19011 Primary osteoarthritis, right shoulder: Secondary | ICD-10-CM | POA: Diagnosis not present

## 2018-08-13 DIAGNOSIS — M25511 Pain in right shoulder: Secondary | ICD-10-CM

## 2018-08-13 LAB — BASIC METABOLIC PANEL
BUN: 12 (ref 4–21)
Creatinine: 0.6 (ref 0.5–1.1)
Glucose: 161
Potassium: 3.7 (ref 3.4–5.3)
Sodium: 142 (ref 137–147)

## 2018-08-13 MED ORDER — METOPROLOL TARTRATE 50 MG PO TABS: 50.0000 mg | ORAL_TABLET | Freq: Two times a day (BID) | ORAL | 1 refills | Status: DC

## 2018-08-13 NOTE — Progress Notes (Signed)
.   Subjective:    CC: BP and right arm pain  HPI:  Hypertension- Pt denies chest pain, SOB, dizziness, or heart palpitations.  Taking meds as directed w/o problems.  Denies medication side effects.    she is c/o chest pain that started this morining that radiates over into her R arm. she said that she was seen by the orthopedist and was told that the swelling is not coming from her shoulder.  She was having swelling going all the way into her right hand infection went to the emergency department for it.  She has been getting some intermittent numbness in her right and left hands as well.  They felt like the more severe deep pain that she is been experiencing in her right shoulder is not coming from the shoulder itself.  She is just feeling a little stressed and overwhelmed right now.  She did go see neurology and had a consult with them.  This neurologist does not think she has MS and so does not have a great explanation for her episodes of weakness and gait instability and vertigo.  She is also frustrated with her health problems and just wants to feel well.  .   Impaired fasting glucose-no increased thirst or urination. No symptoms consistent with hypoglycemia.   Past medical history, Surgical history, Family history not pertinant except as noted below, Social history, Allergies, and medications have been entered into the medical record, reviewed, and corrections made.   Review of Systems: No fevers, chills, night sweats, weight loss, chest pain, or shortness of breath.   Objective:    General: Well Developed, well nourished, and in no acute distress.  Neuro: Alert and oriented x3, extra-ocular muscles intact, sensation grossly intact.  HEENT: Normocephalic, atraumatic  Skin: Warm and dry, no rashes. Cardiac: Regular rate and rhythm, no murmurs rubs or gallops, no lower extremity edema.  Respiratory: Clear to auscultation bilaterally. Not using accessory muscles, speaking in full  sentences. MSK: Right shoulder nontender.  She does have full extension but has pain with full extension.  She is somewhat limited in internal rotation when she tries to reach behind her back.   Impression and Recommendations:   Right arm pain, with intermittant bilat hand numbness-I suspect it is probably actually coming more from her neck especially since she is been getting some intermittent numbness in her hands bilaterally.  She has some known cervical disc disease in the cervical spine.  We could certainly consider working this up further.  Her orthopedist has ordered some type of testing on that right arm she is not sure if it is a nerve conduction.  They have not called her to schedule it yet as she was just at the orthopedic office on Friday.  Right hand and arm swelling-I do think could be some component of lymphedema.  Hypertension- Will recheck in 2 weeks.    Impaired fasting glucose-she did just have a repeat hemoglobin A1c on Friday and is awaiting those results.  Atypical chest pain - gave reassurance.  I don't think this is cardia at all.    Time spent 40 min, > 50% spent fact to face counseling about right arm pain, chest pain, BP, IFG and hand numbness.

## 2018-08-13 NOTE — Patient Instructions (Signed)
To try to lower your glucose, work on low carb diet and sugar.  Recommend cinnamon capsules to lower blood sugar.  Plan to recheck A1C in 90 days.

## 2018-08-15 DIAGNOSIS — Z87891 Personal history of nicotine dependence: Secondary | ICD-10-CM | POA: Diagnosis not present

## 2018-08-15 DIAGNOSIS — R109 Unspecified abdominal pain: Secondary | ICD-10-CM | POA: Diagnosis not present

## 2018-08-15 DIAGNOSIS — R0602 Shortness of breath: Secondary | ICD-10-CM | POA: Diagnosis not present

## 2018-08-15 DIAGNOSIS — G8929 Other chronic pain: Secondary | ICD-10-CM | POA: Diagnosis not present

## 2018-08-15 DIAGNOSIS — R9431 Abnormal electrocardiogram [ECG] [EKG]: Secondary | ICD-10-CM | POA: Diagnosis not present

## 2018-08-16 ENCOUNTER — Encounter: Payer: Self-pay | Admitting: Family Medicine

## 2018-08-16 ENCOUNTER — Other Ambulatory Visit: Payer: Self-pay

## 2018-08-16 DIAGNOSIS — R7301 Impaired fasting glucose: Secondary | ICD-10-CM | POA: Insufficient documentation

## 2018-08-16 DIAGNOSIS — E1165 Type 2 diabetes mellitus with hyperglycemia: Secondary | ICD-10-CM | POA: Insufficient documentation

## 2018-08-16 DIAGNOSIS — E118 Type 2 diabetes mellitus with unspecified complications: Secondary | ICD-10-CM | POA: Insufficient documentation

## 2018-08-16 DIAGNOSIS — R9431 Abnormal electrocardiogram [ECG] [EKG]: Secondary | ICD-10-CM | POA: Diagnosis not present

## 2018-08-16 MED ORDER — METOPROLOL TARTRATE 100 MG PO TABS: 100.0000 mg | ORAL_TABLET | Freq: Two times a day (BID) | ORAL | 1 refills | Status: DC

## 2018-08-16 NOTE — Telephone Encounter (Signed)
Mykah called and state the Metoprolol was increased to 100 mg bid. She states the pharmacy still has Metoprolol 50 mg bid. Please advise.

## 2018-08-19 ENCOUNTER — Ambulatory Visit (INDEPENDENT_AMBULATORY_CARE_PROVIDER_SITE_OTHER): Payer: Medicare HMO | Admitting: Family Medicine

## 2018-08-19 VITALS — BP 152/91 | HR 72 | Ht 62.0 in | Wt 222.0 lb

## 2018-08-19 DIAGNOSIS — M25511 Pain in right shoulder: Secondary | ICD-10-CM | POA: Diagnosis not present

## 2018-08-19 NOTE — Patient Instructions (Signed)
Follow through with Dr. Linton Rump' recommendations. Start physical therapy, do home exercises on days you don't go to therapy. Heat 15 minutes at a time 3-4 times a day. Get the nerve study done as well. You're limited in what you can do for this unfortunately given your allergies. Follow up with me as needed.

## 2018-08-20 ENCOUNTER — Encounter: Payer: Self-pay | Admitting: Family Medicine

## 2018-08-20 DIAGNOSIS — M545 Low back pain: Secondary | ICD-10-CM | POA: Diagnosis not present

## 2018-08-20 DIAGNOSIS — R531 Weakness: Secondary | ICD-10-CM | POA: Diagnosis not present

## 2018-08-20 DIAGNOSIS — R2241 Localized swelling, mass and lump, right lower limb: Secondary | ICD-10-CM | POA: Diagnosis not present

## 2018-08-20 DIAGNOSIS — R05 Cough: Secondary | ICD-10-CM | POA: Diagnosis not present

## 2018-08-20 DIAGNOSIS — R0789 Other chest pain: Secondary | ICD-10-CM | POA: Diagnosis not present

## 2018-08-20 DIAGNOSIS — R079 Chest pain, unspecified: Secondary | ICD-10-CM | POA: Diagnosis not present

## 2018-08-20 DIAGNOSIS — R69 Illness, unspecified: Secondary | ICD-10-CM | POA: Diagnosis not present

## 2018-08-20 DIAGNOSIS — R2242 Localized swelling, mass and lump, left lower limb: Secondary | ICD-10-CM | POA: Diagnosis not present

## 2018-08-20 DIAGNOSIS — R002 Palpitations: Secondary | ICD-10-CM | POA: Diagnosis not present

## 2018-08-20 DIAGNOSIS — R9431 Abnormal electrocardiogram [ECG] [EKG]: Secondary | ICD-10-CM | POA: Diagnosis not present

## 2018-08-20 DIAGNOSIS — R0602 Shortness of breath: Secondary | ICD-10-CM | POA: Diagnosis not present
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
## 2018-08-21 ENCOUNTER — Encounter: Payer: Self-pay | Admitting: Family Medicine

## 2018-08-22 ENCOUNTER — Encounter: Payer: Self-pay | Admitting: Family Medicine

## 2018-08-22 DIAGNOSIS — R69 Illness, unspecified: Secondary | ICD-10-CM | POA: Diagnosis not present

## 2018-08-23 DIAGNOSIS — R200 Anesthesia of skin: Secondary | ICD-10-CM | POA: Diagnosis not present

## 2018-08-23 DIAGNOSIS — M7551 Bursitis of right shoulder: Secondary | ICD-10-CM | POA: Diagnosis not present

## 2018-08-23 DIAGNOSIS — M19011 Primary osteoarthritis, right shoulder: Secondary | ICD-10-CM | POA: Diagnosis not present

## 2018-08-23 DIAGNOSIS — R202 Paresthesia of skin: Secondary | ICD-10-CM | POA: Diagnosis not present

## 2018-08-23 DIAGNOSIS — M79621 Pain in right upper arm: Secondary | ICD-10-CM | POA: Diagnosis not present

## 2018-08-23 DIAGNOSIS — M792 Neuralgia and neuritis, unspecified: Secondary | ICD-10-CM | POA: Diagnosis not present

## 2018-08-25 DIAGNOSIS — Z87891 Personal history of nicotine dependence: Secondary | ICD-10-CM | POA: Diagnosis not present

## 2018-08-25 DIAGNOSIS — Z886 Allergy status to analgesic agent status: Secondary | ICD-10-CM | POA: Diagnosis not present

## 2018-08-25 DIAGNOSIS — M7989 Other specified soft tissue disorders: Secondary | ICD-10-CM | POA: Diagnosis not present

## 2018-08-25 DIAGNOSIS — M79621 Pain in right upper arm: Secondary | ICD-10-CM | POA: Diagnosis not present

## 2018-08-25 DIAGNOSIS — I471 Supraventricular tachycardia: Secondary | ICD-10-CM | POA: Diagnosis not present

## 2018-08-25 DIAGNOSIS — R9431 Abnormal electrocardiogram [ECG] [EKG]: Secondary | ICD-10-CM | POA: Diagnosis not present

## 2018-08-25 DIAGNOSIS — R918 Other nonspecific abnormal finding of lung field: Secondary | ICD-10-CM | POA: Diagnosis not present

## 2018-08-25 DIAGNOSIS — Z888 Allergy status to other drugs, medicaments and biological substances status: Secondary | ICD-10-CM | POA: Diagnosis not present

## 2018-08-25 DIAGNOSIS — Z91010 Allergy to peanuts: Secondary | ICD-10-CM | POA: Diagnosis not present

## 2018-08-25 DIAGNOSIS — R079 Chest pain, unspecified: Secondary | ICD-10-CM | POA: Diagnosis not present

## 2018-08-25 DIAGNOSIS — K219 Gastro-esophageal reflux disease without esophagitis: Secondary | ICD-10-CM | POA: Diagnosis not present

## 2018-08-25 DIAGNOSIS — E785 Hyperlipidemia, unspecified: Secondary | ICD-10-CM | POA: Diagnosis not present

## 2018-08-25 DIAGNOSIS — R2231 Localized swelling, mass and lump, right upper limb: Secondary | ICD-10-CM | POA: Diagnosis not present

## 2018-08-25 DIAGNOSIS — M25511 Pain in right shoulder: Secondary | ICD-10-CM | POA: Diagnosis not present

## 2018-08-25 DIAGNOSIS — E119 Type 2 diabetes mellitus without complications: Secondary | ICD-10-CM | POA: Diagnosis not present

## 2018-08-25 DIAGNOSIS — I10 Essential (primary) hypertension: Secondary | ICD-10-CM | POA: Diagnosis not present

## 2018-08-26 DIAGNOSIS — M7989 Other specified soft tissue disorders: Secondary | ICD-10-CM | POA: Diagnosis not present

## 2018-08-26 DIAGNOSIS — M25611 Stiffness of right shoulder, not elsewhere classified: Secondary | ICD-10-CM | POA: Diagnosis not present

## 2018-08-26 DIAGNOSIS — M25511 Pain in right shoulder: Secondary | ICD-10-CM | POA: Diagnosis not present

## 2018-08-27 ENCOUNTER — Ambulatory Visit (INDEPENDENT_AMBULATORY_CARE_PROVIDER_SITE_OTHER): Payer: Medicare HMO | Admitting: Family Medicine

## 2018-08-27 ENCOUNTER — Encounter: Payer: Self-pay | Admitting: Family Medicine

## 2018-08-27 VITALS — BP 146/85 | HR 96 | Ht 62.0 in | Wt 223.0 lb

## 2018-08-27 DIAGNOSIS — M25511 Pain in right shoulder: Secondary | ICD-10-CM

## 2018-08-27 DIAGNOSIS — G8929 Other chronic pain: Secondary | ICD-10-CM | POA: Diagnosis not present

## 2018-08-27 DIAGNOSIS — M7989 Other specified soft tissue disorders: Secondary | ICD-10-CM | POA: Diagnosis not present

## 2018-08-27 DIAGNOSIS — R0602 Shortness of breath: Secondary | ICD-10-CM

## 2018-08-27 DIAGNOSIS — R1013 Epigastric pain: Secondary | ICD-10-CM

## 2018-08-27 MED ORDER — TRAMADOL HCL 50 MG PO TABS: 50.0000 mg | ORAL_TABLET | Freq: Three times a day (TID) | ORAL | 0 refills | Status: DC | PRN

## 2018-08-27 NOTE — Patient Instructions (Addendum)
Taking your Pepcid or ranitidine twice a day and can use your carafate.  Ice instead of heat.   Try the tramadol with Tylenol.
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
## 2018-08-28 ENCOUNTER — Ambulatory Visit: Payer: Self-pay | Admitting: Family Medicine

## 2018-08-30 ENCOUNTER — Telehealth: Payer: Self-pay

## 2018-08-30 DIAGNOSIS — Z87891 Personal history of nicotine dependence: Secondary | ICD-10-CM | POA: Diagnosis not present

## 2018-08-30 DIAGNOSIS — R739 Hyperglycemia, unspecified: Secondary | ICD-10-CM | POA: Diagnosis not present

## 2018-08-30 DIAGNOSIS — R69 Illness, unspecified: Secondary | ICD-10-CM | POA: Diagnosis not present

## 2018-08-30 DIAGNOSIS — R0989 Other specified symptoms and signs involving the circulatory and respiratory systems: Secondary | ICD-10-CM | POA: Diagnosis not present

## 2018-08-30 DIAGNOSIS — I10 Essential (primary) hypertension: Secondary | ICD-10-CM | POA: Diagnosis not present

## 2018-08-30 DIAGNOSIS — R0602 Shortness of breath: Secondary | ICD-10-CM | POA: Diagnosis not present

## 2018-08-30 MED ORDER — SPIRONOLACTONE 25 MG PO TABS: ORAL_TABLET | ORAL | 1 refills | Status: DC

## 2018-08-30 NOTE — Telephone Encounter (Signed)
Jennifer [name] called and states she doesn't feel good today. She states she went to the ED and her blood pressure and glucose was both elevated. She also wants a refill on spironolactone, it is not on her current medication list.

## 2018-08-30 NOTE — Telephone Encounter (Signed)
Call patient: Prescription sent for spironolactone.  Just remind her that if she takes the spironolactone to not take potassium on the same day.  Spironolactone actually helps her hold onto potassium which is a good thing for her because she tends to run low sometimes.  Just encouraged her to try taking maybe every other day to help lower her blood pressure.  Really make sure she is diligent about watching her salt intake.

## 2018-08-30 NOTE — Telephone Encounter (Signed)
Patient advised of recommendations.  

## 2018-08-30 NOTE — Telephone Encounter (Signed)
Left a message advising patient of recommendations.  

## 2018-09-02 ENCOUNTER — Ambulatory Visit: Payer: Self-pay | Admitting: Family Medicine

## 2018-09-02 ENCOUNTER — Encounter: Payer: Self-pay | Admitting: *Deleted

## 2018-09-02 ENCOUNTER — Telehealth: Payer: Self-pay | Admitting: Family Medicine

## 2018-09-02 DIAGNOSIS — R6889 Other general symptoms and signs: Secondary | ICD-10-CM | POA: Diagnosis not present

## 2018-09-02 DIAGNOSIS — R7309 Other abnormal glucose: Secondary | ICD-10-CM | POA: Diagnosis not present

## 2018-09-02 DIAGNOSIS — R9431 Abnormal electrocardiogram [ECG] [EKG]: Secondary | ICD-10-CM | POA: Diagnosis not present

## 2018-09-02 DIAGNOSIS — I10 Essential (primary) hypertension: Secondary | ICD-10-CM | POA: Diagnosis not present

## 2018-09-02 DIAGNOSIS — Z87891 Personal history of nicotine dependence: Secondary | ICD-10-CM | POA: Diagnosis not present

## 2018-09-02 NOTE — Progress Notes (Deleted)
   Subjective:    Patient ID: Jennifer [name], female    DOB: [birth year], 53 y.o.   MRN: [id number]  HPI    Review of Systems     Objective:   Physical Exam        Assessment & Plan:

## 2018-09-02 NOTE — Telephone Encounter (Signed)
Patient went to [place] [facility] today for blood pressure and blood sugar issues. She has been discharged and they said she needs to follow up in 3 days, January 23rd. No open slots that day. Would like something before lunch. Please advise.

## 2018-09-02 NOTE — Telephone Encounter (Signed)
Routing for review and recommendation

## 2018-09-03 DIAGNOSIS — R9431 Abnormal electrocardiogram [ECG] [EKG]: Secondary | ICD-10-CM | POA: Diagnosis not present

## 2018-09-03 NOTE — Telephone Encounter (Signed)
Made patient aware and scheduled appointment. No further questions at this time.

## 2018-09-03 NOTE — Telephone Encounter (Signed)
PCP states Pt can be added on Friday before lunch at 11

## 2018-09-06 ENCOUNTER — Encounter: Payer: Self-pay | Admitting: Family Medicine

## 2018-09-06 ENCOUNTER — Ambulatory Visit (INDEPENDENT_AMBULATORY_CARE_PROVIDER_SITE_OTHER): Payer: Medicare HMO | Admitting: Family Medicine

## 2018-09-06 VITALS — BP 138/75 | HR 89 | Ht 62.0 in | Wt 223.0 lb

## 2018-09-06 DIAGNOSIS — H9202 Otalgia, left ear: Secondary | ICD-10-CM | POA: Diagnosis not present

## 2018-09-06 DIAGNOSIS — J019 Acute sinusitis, unspecified: Secondary | ICD-10-CM

## 2018-09-06 DIAGNOSIS — R7301 Impaired fasting glucose: Secondary | ICD-10-CM

## 2018-09-06 DIAGNOSIS — I10 Essential (primary) hypertension: Secondary | ICD-10-CM

## 2018-09-06 DIAGNOSIS — R2681 Unsteadiness on feet: Secondary | ICD-10-CM

## 2018-09-06 LAB — POCT GLYCOSYLATED HEMOGLOBIN (HGB A1C): Hemoglobin A1C: 6.2 % — AB (ref 4.0–5.6)

## 2018-09-06 IMAGING — DX DG FINGER INDEX 2+V*R*
3 series · 3 of 3 positions shown · non-contrast
Comparison: None.

CLINICAL DATA: Pain following closing second digit in door 1 week
ago, initial encounter

EXAM:
RIGHT INDEX FINGER 2+V

[finger ap]
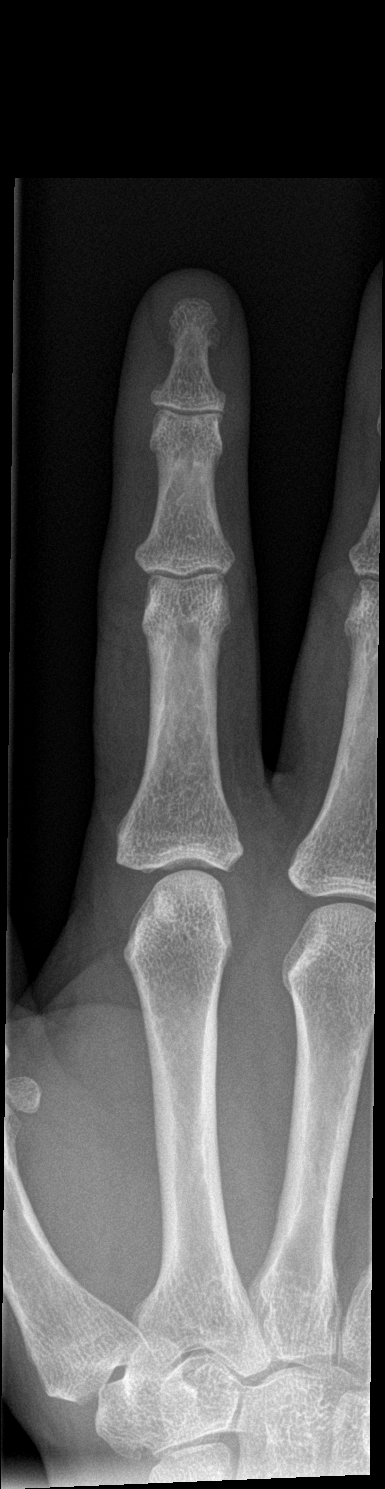

[finger obl]
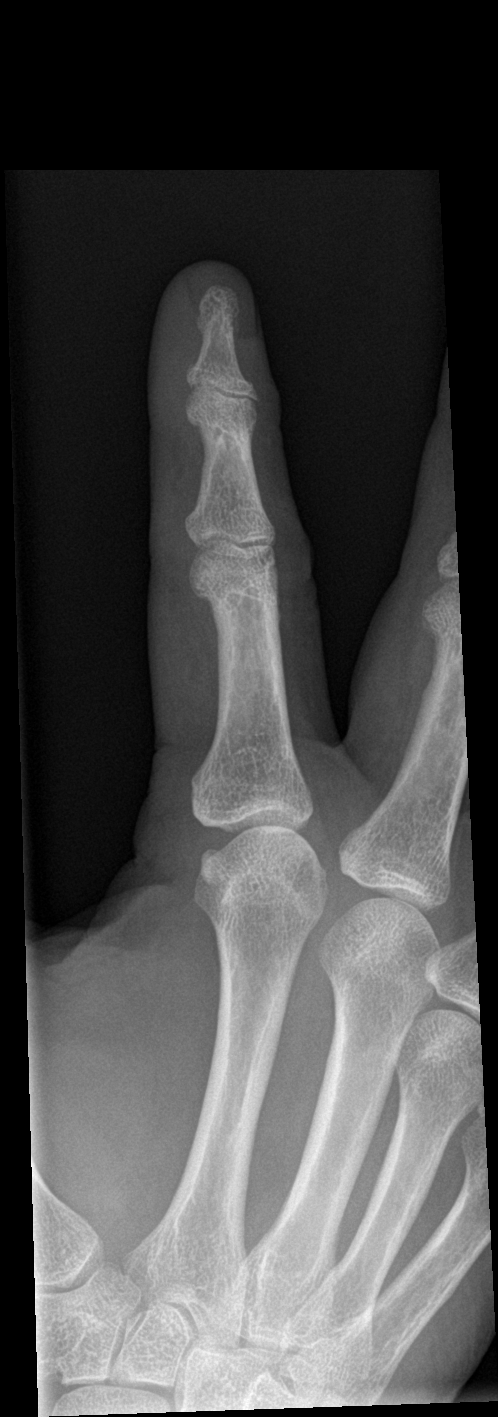

[finger lat]
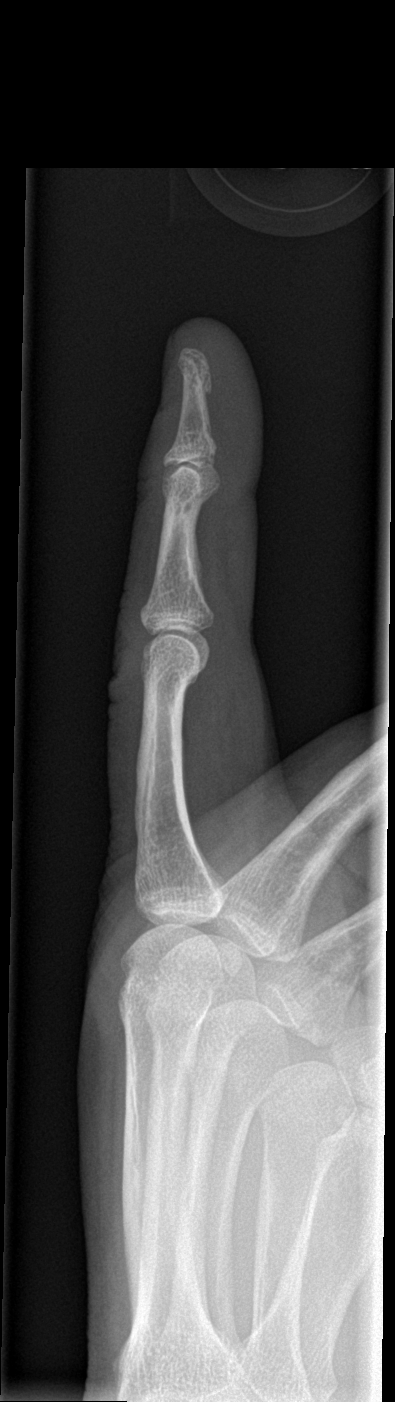

[3 of 3 positions shown; findings below may reference images not displayed]

FINDINGS: Mild degenerative changes are noted in the distal interphalangeal
joint. No acute fracture or dislocation is noted. No gross soft
tissue abnormality is seen.
IMPRESSION: No acute abnormality noted.

## 2018-09-06 MED ORDER — METFORMIN HCL 500 MG PO TABS: 250.0000 mg | ORAL_TABLET | Freq: Two times a day (BID) | ORAL | 3 refills | Status: DC

## 2018-09-06 MED ORDER — CLINDAMYCIN HCL 75 MG PO CAPS: 75.0000 mg | ORAL_CAPSULE | Freq: Four times a day (QID) | ORAL | 0 refills | Status: DC

## 2018-09-06 NOTE — Progress Notes (Signed)
Subjective:    CC: BP  HPI:  She has been getting some blood from her sinuses and has had a lot of drainage. Worse on the left side.  She has had some left ear fullness.  Some irritation to the throat and has been coughing a lot.  No fevers.  She has had some chills and sweats.  She feels like her sinuses are draining into the side of her throat and her left ear feels very full.  She says her BP has been elevated as well.  Very concerned about the recent increase in blood pressures.  She reports she has been taking her blood pressure medication regularly.  She really takes the Spironolactone on and off she does not take it daily or even every other day.  Impaired fasting glucose-no increased thirst or urination. No symptoms consistent with hypoglycemia. Glucose 88-236.    Feels like her balance is really off.  She has been leaning to the left side.  She is had similar symptoms on and off over the years.  It gets to the point where it is difficult to do daily activities around the house and even stand to put her make-up on.  Past medical history, Surgical history, Family history not pertinant except as noted below, Social history, Allergies, and medications have been entered into the medical record, reviewed, and corrections made.   Review of Systems: No fevers, chills, night sweats, weight loss, chest pain, or shortness of breath.   Objective:    General: Well Developed, well nourished, and in no acute distress.  Neuro: Alert and oriented x3, extra-ocular muscles intact, sensation grossly intact.  HEENT: Normocephalic, atraumatic,  OP is clear, no sig cervical lymphadenopthy.  TMs and canals are clear bilaterally. Skin: Warm and dry, no rashes. Cardiac: Regular rate and rhythm, no murmurs rubs or gallops, no lower extremity edema.  Respiratory: Clear to auscultation bilaterally. Not using accessory muscles, speaking in full sentences.   Impression and Recommendations:    HTN - repeat bp  was better but pressure have been high.  Gust several options and encouraged her to continue to take her medication regularly.  Explained that most patients require 3 blood pressure medications to actually control the blood pressure.  In addition to really working on diet and weight loss which would be helpful as well.  Will try spironolactone daily instead of every other day.  Discussed the option of adding another medication but she wants to hold off for now.  IFG  - up to 6.2.  Discussed metformin and option.  Also discussed potential side effects..  Work on cutting back on carbs and sugars.    Acute sinusitis - will tx with clindamycin.  If not better in 1 week.  Continue with nasal saline rinse and symptomatic care.  Gait Instability-again discussed with her that she would really need to see neurology about this.  She has had similar symptoms on and off over the years and we have yet to be able to determine the exact cause of this.  Time spent 40 minutes, greater than 50% of time spent face-to-face counseling about high blood pressure, glucose, sinusitis, and left ear pain.

## 2018-09-10 ENCOUNTER — Emergency Department (HOSPITAL_BASED_OUTPATIENT_CLINIC_OR_DEPARTMENT_OTHER)
Admission: EM | Admit: 2018-09-10 | Discharge: 2018-09-10 | Disposition: A | Discharge status: Home / Self Care | Payer: Medicare HMO | Attending: Emergency Medicine | Admitting: Emergency Medicine

## 2018-09-10 ENCOUNTER — Other Ambulatory Visit: Payer: Self-pay

## 2018-09-10 ENCOUNTER — Encounter (HOSPITAL_BASED_OUTPATIENT_CLINIC_OR_DEPARTMENT_OTHER): Payer: Self-pay | Admitting: Emergency Medicine

## 2018-09-10 DIAGNOSIS — I10 Essential (primary) hypertension: Secondary | ICD-10-CM | POA: Diagnosis not present

## 2018-09-10 DIAGNOSIS — R0602 Shortness of breath: Secondary | ICD-10-CM | POA: Insufficient documentation

## 2018-09-10 DIAGNOSIS — Z87891 Personal history of nicotine dependence: Secondary | ICD-10-CM | POA: Diagnosis not present

## 2018-09-10 DIAGNOSIS — Z7984 Long term (current) use of oral hypoglycemic drugs: Secondary | ICD-10-CM | POA: Diagnosis not present

## 2018-09-10 DIAGNOSIS — Z8541 Personal history of malignant neoplasm of cervix uteri: Secondary | ICD-10-CM | POA: Insufficient documentation

## 2018-09-10 DIAGNOSIS — J45909 Unspecified asthma, uncomplicated: Secondary | ICD-10-CM | POA: Insufficient documentation

## 2018-09-10 DIAGNOSIS — R002 Palpitations: Secondary | ICD-10-CM | POA: Diagnosis not present

## 2018-09-10 DIAGNOSIS — Z79899 Other long term (current) drug therapy: Secondary | ICD-10-CM | POA: Insufficient documentation

## 2018-09-10 DIAGNOSIS — M79602 Pain in left arm: Secondary | ICD-10-CM | POA: Diagnosis not present

## 2018-09-10 LAB — CBC WITH DIFFERENTIAL/PLATELET
Abs Immature Granulocytes: 0.03 10*3/uL (ref 0.00–0.07)
Basophils Absolute: 0 10*3/uL (ref 0.0–0.1)
Basophils Relative: 1 %
Eosinophils Absolute: 0.1 10*3/uL (ref 0.0–0.5)
Eosinophils Relative: 2 %
HCT: 42.4 % (ref 36.0–46.0)
Hemoglobin: 13.4 g/dL (ref 12.0–15.0)
Immature Granulocytes: 1 %
Lymphocytes Relative: 23 %
Lymphs Abs: 1.5 10*3/uL (ref 0.7–4.0)
MCH: 29.1 pg (ref 26.0–34.0)
MCHC: 31.6 g/dL (ref 30.0–36.0)
MCV: 92 fL (ref 80.0–100.0)
Monocytes Absolute: 0.6 10*3/uL (ref 0.1–1.0)
Monocytes Relative: 10 %
Neutro Abs: 4 10*3/uL (ref 1.7–7.7)
Neutrophils Relative %: 63 %
Platelets: 189 10*3/uL (ref 150–400)
RBC: 4.61 MIL/uL (ref 3.87–5.11)
RDW: 13.5 % (ref 11.5–15.5)
WBC: 6.2 10*3/uL (ref 4.0–10.5)
nRBC: 0 % (ref 0.0–0.2)

## 2018-09-10 LAB — BASIC METABOLIC PANEL
Anion gap: 5 (ref 5–15)
BUN: 14 mg/dL (ref 6–20)
CO2: 26 mmol/L (ref 22–32)
Calcium: 8.5 mg/dL — ABNORMAL LOW (ref 8.9–10.3)
Chloride: 107 mmol/L (ref 98–111)
Creatinine, Ser: 0.56 mg/dL (ref 0.44–1.00)
GFR calc Af Amer: >60 mL/min (ref >60)
GFR calc non Af Amer: >60 mL/min (ref >60)
Glucose, Bld: 97 mg/dL (ref 70–99)
Potassium: 3.7 mmol/L (ref 3.5–5.1)
Sodium: 138 mmol/L (ref 135–145)

## 2018-09-10 LAB — TROPONIN I: Troponin I: <0.03 ng/mL (ref <0.03)

## 2018-09-10 NOTE — ED Triage Notes (Signed)
Left arm pain from wrist to shoulder for "a day and a half." Also sts her heart has "been going funky on me".  Has appt with cardiology Friday. Sts the arm pain kept her up all night.

## 2018-09-10 NOTE — ED Notes (Signed)
NAD at this time. Pt is stable and going home.  

## 2018-09-10 NOTE — ED Notes (Signed)
C/o left shoulder and arm pain x 1.5 days,  Strong pain, states heart has been acting up,  Feels like it is shaking at times nd that it is going to stop,  States has some condition that it is hard to catch on monitors
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
## 2018-09-11 ENCOUNTER — Ambulatory Visit: Payer: Self-pay | Admitting: Family Medicine

## 2018-09-11 DIAGNOSIS — Z1231 Encounter for screening mammogram for malignant neoplasm of breast: Secondary | ICD-10-CM | POA: Diagnosis not present

## 2018-09-11 DIAGNOSIS — E876 Hypokalemia: Secondary | ICD-10-CM | POA: Diagnosis not present

## 2018-09-11 LAB — BASIC METABOLIC PANEL
BUN: 14 (ref 4–21)
Creatinine: 0.6 (ref 0.5–1.1)
Glucose: 189
Potassium: 3.6 (ref 3.4–5.3)
Sodium: 140 (ref 137–147)

## 2018-09-11 LAB — HM MAMMOGRAPHY

## 2018-09-13 ENCOUNTER — Encounter: Payer: Self-pay | Admitting: Cardiology

## 2018-09-13 ENCOUNTER — Ambulatory Visit: Payer: Medicare HMO | Admitting: Cardiology

## 2018-09-13 VITALS — BP 122/80 | HR 91 | Ht 62.0 in | Wt 224.0 lb

## 2018-09-13 DIAGNOSIS — I471 Supraventricular tachycardia: Secondary | ICD-10-CM | POA: Diagnosis not present

## 2018-09-13 DIAGNOSIS — I493 Ventricular premature depolarization: Secondary | ICD-10-CM | POA: Diagnosis not present

## 2018-09-13 MED ORDER — PROPAFENONE HCL 225 MG PO TABS: 225.0000 mg | ORAL_TABLET | Freq: Two times a day (BID) | ORAL | 3 refills | Status: DC

## 2018-09-13 NOTE — Patient Instructions (Addendum)
Medication Instructions:  Your physician has recommended you make the following change in your medication:  1. START Propafenone 225 mg twice daily  * If you need a refill on your cardiac medications before your next appointment, please call your pharmacy.   Labwork: None ordered  Testing/Procedures: Your physician has requested that you have an exercise tolerance test. For further information please visit [email or web address]. Please also follow instruction sheet, as given.  Follow-Up: Your physician recommends that you schedule a follow-up appointment in: 3 months with Dr. Curt Bears.  Thank you for choosing [facility]!!   Jennifer Curet, RN [phone]  Any Other Special Instructions Will Be Listed Below (If Applicable).   Exercise Stress Test  An exercise stress test is a test that is done to collect information about how your heart functions during exercise. The test is done while you are walking on a treadmill or using an exercise bike. The goal is to raise your heart rate and "stress" the heart. The heart is evaluated before, during, and after you exercise. An electrocardiogram (ECG) will be used to monitor the heart, and your blood pressure will also be monitored. In some cases, nuclear scanning or an ultrasound of the heart (echocardiogram) will also be done to evaluate your heart. An exercise stress test is done to look for coronary artery disease (CAD). The test may also be done to:  Evaluate your limits of exercise during cardiac rehabilitation.  Check for high blood pressure during exercise.  Check how well you can exercise after such treatments as coronary stenting or new medicines.  Check for problems with blood flow to your arms and legs during exercise. If you have an abnormal test result, this may mean that you are not getting enough blood flow to your heart during exercise. More testing may be needed to understand why your test was not normal. Tell a health  care provider about:  Any allergies you have.  All medicines you are taking, including vitamins, herbs, eye drops, creams, and over-the-counter medicines.  Any blood disorders you have.  Any surgeries you have had.  Any medical conditions you have.  Whether you are pregnant or may be pregnant. What are the risks? Generally, this is a safe procedure. However, problems may occur, including:  Pain or pressure in the following areas: ? Chest. ? Jaw or neck. ? Between your shoulder blades. ? Down your left arm.  Dizziness or lightheadedness.  Shortness of breath.  Increased or irregular heartbeats.  Nausea or vomiting.  Heart attack (rare).  Life-threatening abnormal heart rhythm (rare). What happens before the procedure?  Follow instructions from your health care provider about eating or drinking restrictions. ? You may be told to avoid all forms of caffeine for 24 hours before the test. This includes coffee, tea (even decaffeinated tea), caffeinated sodas, chocolate, cocoa, and certain pain medicines.  Ask your health care provider about: ? Taking over-the-counter medicines, vitamins, herbs, and supplements. ? Changing or stopping your regular medicines. This is especially important if you are taking diabetes medicines or beta-blocker medicines.  If you have diabetes, ask how you are to take your insulin or pills. It is common to adjust your insulin dose the morning of the test.  If you are taking beta-blocker medicines, it is important to talk to your health care provider about these medicines well before the date of your test. Taking beta-blocker medicines may interfere with the test. In some cases, these medicines may need to be changed or stopped  24 hours or more before the test.  If you wear a nitroglycerin patch, it may need to be removed prior to the test. Ask your health care provider if the patch should be removed before the test.  If you use an inhaler for any  breathing condition, bring it with you to the test.  Do not apply lotions, powders, creams, or oils on your chest prior to the test.  Wear loose-fitting clothes and comfortable walking shoes.  Do not use any products that contain nicotine or tobacco, such as cigarettes and e-cigarettes, for 4 hours before the test or as told by your health care provider. If you need help quitting, ask your health care provider. What happens during the procedure?  Multiple electrodes will be attached to your chest.  Multiple wires will be attached to the electrodes. These will transfer the electrical impulses from your heart to the ECG machine. Your heart will be monitored both at rest and while exercising.  If you are also having an echocardiogram or nuclear scanning, images of your heart will be taken before and after you exercise.  A blood pressure cuff will be placed around your arm to measure your blood pressure throughout the test. You will feel it tighten and loosen throughout the test.  You will walk on a treadmill or use a stationary bike. If you cannot use these, you may be asked to turn a crank with your hands.  You will start at a slow pace or level on the exercise machine. The exercise difficulty will be slowly increased to raise your heart rate. In the case of a treadmill, the speed and incline will gradually be increased.  You may be asked to periodically breathe into a tube. This measures the gases you breathe out.  You will be asked how you are feeling throughout the test. You will be asked to rate your level of exertion.  Tell the staff right away if you feel: ? Chest pain. ? Dizziness. ? Shortness of breath. ? Too fatigued to continue. ? Pain or aching in your legs or arms.  You will exercise until you have symptoms or until you reach a target heart rate. The test will also be stopped if you have changes in your blood pressure or ECG readings, or if you develop an irregular heartbeat  (arrhythmia). The procedure may vary among health care providers and hospitals. What happens after the procedure?  You will sit down and recover from the exercise. Your blood pressure, heart rate, and ECG will be monitored until you recover.  You may return to your normal schedule, including diet, activities, and medicines, unless your health care provider tells you otherwise.  It is up to you to get your test results. Ask your health care provider, or the department that is doing the test, when your results will be ready. Summary  An exercise stress test is a test that is done to collect information about how your heart functions during exercise.  This test is done to look for coronary artery disease (CAD).  During this test, you will walk on a treadmill or use an exercise bike to raise your heart rate.  It is important to follow instructions from your health care provider about eating and drinking restrictions before the test. This may include avoiding caffeine and certain medicines before the test. This information is not intended to replace advice given to you by your health care provider. Make sure you discuss any questions you have with  your health care provider. Document Released: 07/28/2000 Document Revised: 10/04/2016 Document Reviewed: 10/04/2016 Elsevier Interactive Patient Education  2019 [employer].
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
## 2018-09-16 DIAGNOSIS — R202 Paresthesia of skin: Secondary | ICD-10-CM | POA: Diagnosis not present

## 2018-09-16 DIAGNOSIS — R0789 Other chest pain: Secondary | ICD-10-CM | POA: Diagnosis not present

## 2018-09-16 DIAGNOSIS — R079 Chest pain, unspecified: Secondary | ICD-10-CM | POA: Diagnosis not present

## 2018-09-16 DIAGNOSIS — R9431 Abnormal electrocardiogram [ECG] [EKG]: Secondary | ICD-10-CM | POA: Diagnosis not present

## 2018-09-16 DIAGNOSIS — Z87891 Personal history of nicotine dependence: Secondary | ICD-10-CM | POA: Diagnosis not present

## 2018-09-18 ENCOUNTER — Emergency Department (HOSPITAL_BASED_OUTPATIENT_CLINIC_OR_DEPARTMENT_OTHER)
Admission: EM | Admit: 2018-09-18 | Discharge: 2018-09-18 | Disposition: A | Discharge status: Home / Self Care | Payer: Medicare HMO | Attending: Emergency Medicine | Admitting: Emergency Medicine

## 2018-09-18 ENCOUNTER — Other Ambulatory Visit: Payer: Self-pay

## 2018-09-18 ENCOUNTER — Encounter (HOSPITAL_BASED_OUTPATIENT_CLINIC_OR_DEPARTMENT_OTHER): Payer: Self-pay

## 2018-09-18 ENCOUNTER — Telehealth: Payer: Self-pay | Admitting: Cardiology

## 2018-09-18 ENCOUNTER — Ambulatory Visit: Payer: Self-pay | Admitting: Family Medicine

## 2018-09-18 DIAGNOSIS — I10 Essential (primary) hypertension: Secondary | ICD-10-CM | POA: Diagnosis not present

## 2018-09-18 DIAGNOSIS — J45909 Unspecified asthma, uncomplicated: Secondary | ICD-10-CM | POA: Diagnosis not present

## 2018-09-18 DIAGNOSIS — Z79899 Other long term (current) drug therapy: Secondary | ICD-10-CM | POA: Diagnosis not present

## 2018-09-18 DIAGNOSIS — R1084 Generalized abdominal pain: Secondary | ICD-10-CM | POA: Diagnosis not present

## 2018-09-18 DIAGNOSIS — Z87891 Personal history of nicotine dependence: Secondary | ICD-10-CM | POA: Insufficient documentation

## 2018-09-18 MED ORDER — ALUM & MAG HYDROXIDE-SIMETH 200-200-20 MG/5ML PO SUSP
30.0000 mL | Freq: Once | ORAL | Status: AC
  Administered 2018-09-18: 30 mL via ORAL
  Filled 2018-09-18: qty 30
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
## 2018-09-18 NOTE — Telephone Encounter (Signed)
Spent 20 min talking w/ pt. She is very anxious about starting Propafenone.  She would like to know if there is another medication, not an  antiarrythmic, or a different BB that she can try. States that "it feels like Metoprolol is aggravating my HR". States that Lopressor isn't lasting.  She can feel her HR elevating before her next dose is due.  But, elevated HR only last for 2 min/less according to the patient. She would like to follow up in [facility] office but can't until next month/after d/t her husband will be busy w/ furniture market for the next several weeks (he is her transportation). Pt aware I will discuss options w/ Camnitz and call her back. Patient verbalized understanding and agreeable to plan.

## 2018-09-18 NOTE — Discharge Instructions (Addendum)
Take Carafate as prescribed at your last ER visit. Mylanta is available over-the-counter at the pharmacy currently. Follow-up with your GI doctor as soon as possible.

## 2018-09-18 NOTE — Telephone Encounter (Signed)
Advised pt to stop Lopressor and start Toprol.   Pt states she has tried Toprol before and it didn't work/she experienced unwanted SE.   She states she has tried Sotalol before and she couldn't take d/t SE.  Pt reports that she is only taking Lopressor 50 mg BID.  She informs me that she cannot take more than that dose b/c her BP drops too low. Pt aware I will send to Dr. Curt Bears for further advisement based on above information. She understands I will not call her until Friday w/ recommendation.

## 2018-09-18 NOTE — Telephone Encounter (Signed)
Pt c/o medication issue:  1. Name of Medication: propafenone (RYTHMOL) 225 MG tablet  2. How are you currently taking this medication (dosage and times per day)? Pt has not started taking it yet  3. Are you having a reaction (difficulty breathing--STAT)? no  4. What is your medication issue? Pt would like to discuss medication with nurse before she starts taking it. Pt has concerns and is wondering about other avenues besides this medication   Please call before 11 or after lunch.

## 2018-09-18 NOTE — ED Notes (Signed)
Pt states she does not need an EKG due to her pain c/o is abd pain-pt advised that due to her c/o of epigastric pain and pain to her teeth and jaw an EKG is appropriate test at this time and advised that she has the right to refuse-pt agreeable to EKG in triage

## 2018-09-18 NOTE — ED Triage Notes (Signed)
C/o epigastric pain "that hurts my teeth"-pain started yesterday-states she has hx of same and was to f/u with GI "couple weeks back maybe a month or 2"-did not f/u due to unable to pay copay-entered triage holding abd

## 2018-09-19 DIAGNOSIS — M7581 Other shoulder lesions, right shoulder: Secondary | ICD-10-CM | POA: Diagnosis not present

## 2018-09-19 DIAGNOSIS — S43431A Superior glenoid labrum lesion of right shoulder, initial encounter: Secondary | ICD-10-CM | POA: Diagnosis not present

## 2018-09-19 DIAGNOSIS — M7541 Impingement syndrome of right shoulder: Secondary | ICD-10-CM | POA: Diagnosis not present

## 2018-09-19 DIAGNOSIS — M19011 Primary osteoarthritis, right shoulder: Secondary | ICD-10-CM | POA: Diagnosis not present

## 2018-09-19 DIAGNOSIS — M7521 Bicipital tendinitis, right shoulder: Secondary | ICD-10-CM | POA: Diagnosis not present

## 2018-09-19 DIAGNOSIS — M7551 Bursitis of right shoulder: Secondary | ICD-10-CM | POA: Diagnosis not present

## 2018-09-20 ENCOUNTER — Encounter: Payer: Self-pay | Admitting: Family Medicine

## 2018-09-20 ENCOUNTER — Ambulatory Visit (INDEPENDENT_AMBULATORY_CARE_PROVIDER_SITE_OTHER): Payer: Medicare HMO | Admitting: Family Medicine

## 2018-09-20 ENCOUNTER — Telehealth: Payer: Self-pay | Admitting: *Deleted

## 2018-09-20 VITALS — BP 140/82 | HR 91 | Ht 62.0 in | Wt 225.0 lb

## 2018-09-20 DIAGNOSIS — R1013 Epigastric pain: Secondary | ICD-10-CM

## 2018-09-20 DIAGNOSIS — H938X2 Other specified disorders of left ear: Secondary | ICD-10-CM

## 2018-09-20 DIAGNOSIS — M25511 Pain in right shoulder: Secondary | ICD-10-CM

## 2018-09-20 DIAGNOSIS — R000 Tachycardia, unspecified: Secondary | ICD-10-CM

## 2018-09-20 DIAGNOSIS — G8929 Other chronic pain: Secondary | ICD-10-CM | POA: Diagnosis not present

## 2018-09-20 MED ORDER — SUCRALFATE 1 GM/10ML PO SUSP: 1.0000 g | Freq: Three times a day (TID) | ORAL | 0 refills | Status: DC

## 2018-09-20 MED ORDER — MOMETASONE FUROATE 50 MCG/ACT NA SUSP: 2.0000 | Freq: Every day | NASAL | 3 refills | Status: DC

## 2018-09-20 MED ORDER — RANITIDINE HCL 150 MG/10ML PO SYRP: 150.0000 mg | ORAL_SOLUTION | Freq: Two times a day (BID) | ORAL | 0 refills | Status: DC

## 2018-09-20 NOTE — Telephone Encounter (Deleted)
   [facility] Pre-operative Risk Assessment    Request for surgical clearance:  1. What type of surgery is being performed? ***   2. When is this surgery scheduled? ***   3. What type of clearance is required (medical clearance vs. Pharmacy clearance to hold med vs. Both)? ***  4. Are there any medications that need to be held prior to surgery and how long?***   5. Practice name and name of physician performing surgery? ***   6. What is your office phone number***    7.   What is your office fax number***  8.   Anesthesia type (None, local, MAC, general) ? ***   Jeanann Lewandowsky 09/20/2018, 11:53 AM  _________________________________________________________________   (provider comments below)     [facility] Pre-operative Risk Assessment    Request for surgical clearance:  7. What type of surgery is being performed? R SHOULDER SCOPE, SLAP REPAIR, DCE, SAD, & POSSIBLE RCR   8. When is this surgery scheduled?  TBD   9. What type of clearance is required (medical clearance vs. Pharmacy clearance to hold med vs. Both)?  MEDICAL  10. Are there any medications that need to be held prior to surgery and how long? N/A   11. Practice name and name of physician performing surgery?  [facility] DR. Len Childs   12. What is your office phone number [phone]    7.   What is your office fax number [phone]  9.   Anesthesia type (None, local, MAC, general) ?  CHOICE   Jeanann Lewandowsky 09/20/2018, 11:52 AM  _________________________________________________________________   (provider comments below)

## 2018-09-20 NOTE — Telephone Encounter (Signed)
   [facility] Pre-operative Risk Assessment    Request for surgical clearance:  1. What type of surgery is being performed?  RIGHT SHOULDER SCOPE SLAP REPAIR, DCE, SAD, & POSSIBLE RCR   2. When is this surgery scheduled?  TBD   3. What type of clearance is required (medical clearance vs. Pharmacy clearance to hold med vs. Both)?  MEDICAL  4. Are there any medications that need to be held prior to surgery and how long? N/A   5. Practice name and name of physician performing surgery?  [facility]   6. What is your office phone number [phone]    7.   What is your office fax number [phone]  8.   Anesthesia type (None, local, MAC, general) ? CHOICE   Jeanann Lewandowsky 09/20/2018, 11:54 AM  _________________________________________________________________   (provider comments below)

## 2018-09-20 NOTE — Progress Notes (Signed)
Subjective:    CC: ABdominal pain   HPI:  pt c/o abdominal pain, she reports that the pain is epigastric and will radiate over towards her L side. she ate a piece of toast w/peanut butter and a banana this morning, her last BM was this morning. she would like to be tested for H.pylori and she has an appt 2/19 for an endoscopy. She went to the ED for pain about 2 days ago.  Describes the pain as stabbing and a "punch" in her stomach.  She just been belching a lot and having a lot of gas.  She does have problems with chronic constipation but says she actually had 2 bowel movements yesterday and one this morning so she does feel like her bowels are moving some.  She said she used to get similar abdominal pain years ago and would take Mylanta and that would actually seem to work well but then it was pulled from the market.  More recently she had been taking some Pepcid but said she ran out maybe a few weeks ago she does not remember exactly when and so has not been on anything specifically for her stomach.  Says still has some fullness in the left ear, says it is coming and going.  Says can be painful at time but sometimes feels like she can't hear out of it.  I recently saw her and treated her for sinus infection.  She said she did take the antibiotics all but 1.  She even wondered if that could have been upsetting her stomach though she is taken it multiple times before.  She did see cardiology for the tachyarrhythmia and they have recommended propafenone 225 mg since the beta-blocker does not seem to be working well.  Is been very nervous and hesitant to start the medication she wants to think about it though she did fill the prescription.  Past medical history, Surgical history, Family history not pertinant except as noted below, Social history, Allergies, and medications have been entered into the medical record, reviewed, and corrections made.   Review of Systems: No fevers, chills, night sweats,  weight loss, chest pain, or shortness of breath.   Objective:    General: Well Developed, well nourished, and in no acute distress.  Neuro: Alert and oriented x3, extra-ocular muscles intact, sensation grossly intact.  HEENT: Normocephalic, atraumatic oropharynx clear, TMs and canals are clear.  No significant cervical lymphadenopathy.  Nasal passages are very erythematous.  Is a fair amount of cerumen in that right ear canal. Skin: Warm and dry, no rashes. Cardiac: Regular rate and rhythm, no murmurs rubs or gallops, no lower extremity edema.  Respiratory: Clear to auscultation bilaterally. Not using accessory muscles, speaking in full sentences.   Impression and Recommendations:    Epigastric pain -differential includes gastritis versus gastric ulcer versus duodenitis versus esophagitis.  Again she Artie went to the ED for this a couple of days ago.  Recommend getting her back on an H2 blocker or PPI.  She says in the past PPIs have made her stomach hurt so we will stick with an H2 blocker.  She has taken ranitidine liquid in the past and says she does well with that so prescription was sent from to the pharmacy to take 150 mg twice a day.  She is also willing to use Carafate for at least a few days to try to coat her stomach before meals and at bedtime to try to reduce her pain.  Encouraged her  to continue to make sure that she is having regular bowel movements.  Left ear fullness -ear exam itself is completely normal.  Gave reassurance probably somewhat some eustachian tube dysfunction and pressure behind the ear from her sinuses.  Continue with nasal saline irrigation and continue with mometasone nasal spray.  She does need some refills so that was sent to the pharmacy today.  She let me know that her mammogram was performed September 11, 2018 at [facility] -she is thinking about the propafenone.  She did go ahead and fill the prescription but just has not started it.  I  reassured her that if the beta-blocker is not working which is what she has conveyed to the cardiologist in the next step is to use an antiarrhythmic.  And really that is the only other choice so at some point she may really have to take a chance and try the medication.   Someone is let me know that she did see the orthopedist for her right shoulder and she is planning for surgery in April.  Time spent 40 minutes, greater than 50% of time spent face-to-face discussing epigastric pain, left ear fullness, and tachyarrhythmia.

## 2018-09-20 NOTE — Telephone Encounter (Signed)
Pt aware it will be next before advisement. She is agreeable to plan and states she is doing ok right now.  She appreciates the update.

## 2018-09-23 ENCOUNTER — Ambulatory Visit: Payer: Medicare HMO | Admitting: Family Medicine

## 2018-09-23 DIAGNOSIS — E785 Hyperlipidemia, unspecified: Secondary | ICD-10-CM | POA: Diagnosis not present

## 2018-09-23 DIAGNOSIS — E119 Type 2 diabetes mellitus without complications: Secondary | ICD-10-CM | POA: Diagnosis not present

## 2018-09-23 DIAGNOSIS — K219 Gastro-esophageal reflux disease without esophagitis: Secondary | ICD-10-CM | POA: Diagnosis not present

## 2018-09-23 DIAGNOSIS — Z87892 Personal history of anaphylaxis: Secondary | ICD-10-CM | POA: Diagnosis not present

## 2018-09-23 DIAGNOSIS — R1013 Epigastric pain: Secondary | ICD-10-CM | POA: Diagnosis not present

## 2018-09-23 DIAGNOSIS — I10 Essential (primary) hypertension: Secondary | ICD-10-CM | POA: Diagnosis not present

## 2018-09-23 DIAGNOSIS — R110 Nausea: Secondary | ICD-10-CM | POA: Diagnosis not present

## 2018-09-23 DIAGNOSIS — Z87891 Personal history of nicotine dependence: Secondary | ICD-10-CM | POA: Diagnosis not present

## 2018-09-23 DIAGNOSIS — R5383 Other fatigue: Secondary | ICD-10-CM | POA: Diagnosis not present

## 2018-09-23 DIAGNOSIS — Z8669 Personal history of other diseases of the nervous system and sense organs: Secondary | ICD-10-CM | POA: Diagnosis not present

## 2018-09-23 DIAGNOSIS — Z888 Allergy status to other drugs, medicaments and biological substances status: Secondary | ICD-10-CM | POA: Diagnosis not present

## 2018-09-23 DIAGNOSIS — I471 Supraventricular tachycardia: Secondary | ICD-10-CM | POA: Diagnosis not present

## 2018-09-23 DIAGNOSIS — K5730 Diverticulosis of large intestine without perforation or abscess without bleeding: Secondary | ICD-10-CM | POA: Diagnosis not present

## 2018-09-23 DIAGNOSIS — G35 Multiple sclerosis: Secondary | ICD-10-CM | POA: Diagnosis not present

## 2018-09-23 MED ORDER — METOPROLOL TARTRATE 25 MG PO TABS: 25.0000 mg | ORAL_TABLET | Freq: Every day | ORAL | 3 refills | Status: DC

## 2018-09-23 MED ORDER — METOPROLOL TARTRATE 50 MG PO TABS: 50.0000 mg | ORAL_TABLET | Freq: Two times a day (BID) | ORAL | 1 refills | Status: DC

## 2018-09-23 NOTE — Telephone Encounter (Signed)
Follow Up:      Pt wants to know if she is still supposed to have her Stress Test on 09-27-18?

## 2018-09-23 NOTE — Telephone Encounter (Signed)
Informed pt, per Dr. Curt Bears, the Lopressor is her only medication option. Advised to try taking Lopressor 50 mg in the morning, 25 mg at lunch, 50 mg in the evening.  Pt agreeable to trying the regimen. She will call if this does not help/improve things.

## 2018-09-24 DIAGNOSIS — R1013 Epigastric pain: Secondary | ICD-10-CM | POA: Diagnosis not present

## 2018-09-24 DIAGNOSIS — D1809 Hemangioma of other sites: Secondary | ICD-10-CM | POA: Diagnosis not present

## 2018-09-24 DIAGNOSIS — R110 Nausea: Secondary | ICD-10-CM | POA: Diagnosis not present

## 2018-09-24 DIAGNOSIS — R69 Illness, unspecified: Secondary | ICD-10-CM | POA: Diagnosis not present

## 2018-09-24 DIAGNOSIS — K5730 Diverticulosis of large intestine without perforation or abscess without bleeding: Secondary | ICD-10-CM | POA: Diagnosis not present

## 2018-09-24 DIAGNOSIS — R5383 Other fatigue: Secondary | ICD-10-CM | POA: Diagnosis not present

## 2018-09-24 NOTE — Telephone Encounter (Signed)
   Primary Cardiologist: Shirlee More, MD  Chart reviewed as part of pre-operative protocol coverage. Patient was contacted 09/24/2018 in reference to pre-operative risk assessment for pending surgery as outlined below.  Jennifer [name] was last seen on 09/13/2018 by Dr. Curt Bears.  Since that day, Jennifer [name] has done well. Her major concern at this time is abdominal pain for which she is undergoing endoscopy next week. She denies any chest pain or shortness of breath. Last stress test at [facility] on 03/02/2017 was normal.   Therefore, based on ACC/AHA guidelines, the patient would be at acceptable risk for the planned procedure without further cardiovascular testing.   I will route this recommendation to the requesting party via Epic fax function and remove from pre-op pool.  Please call with questions.  [place], [place] 09/24/2018, 11:50 AM

## 2018-09-25 DIAGNOSIS — R1013 Epigastric pain: Secondary | ICD-10-CM | POA: Diagnosis not present

## 2018-09-26 DIAGNOSIS — E876 Hypokalemia: Secondary | ICD-10-CM | POA: Diagnosis not present

## 2018-09-26 LAB — BASIC METABOLIC PANEL: Sodium: 141 (ref 137–147)

## 2018-09-30 DIAGNOSIS — B9789 Other viral agents as the cause of diseases classified elsewhere: Secondary | ICD-10-CM | POA: Diagnosis not present

## 2018-09-30 DIAGNOSIS — E269 Hyperaldosteronism, unspecified: Secondary | ICD-10-CM | POA: Diagnosis not present

## 2018-09-30 DIAGNOSIS — K219 Gastro-esophageal reflux disease without esophagitis: Secondary | ICD-10-CM | POA: Diagnosis not present

## 2018-09-30 DIAGNOSIS — J069 Acute upper respiratory infection, unspecified: Secondary | ICD-10-CM | POA: Diagnosis not present

## 2018-09-30 DIAGNOSIS — Z8542 Personal history of malignant neoplasm of other parts of uterus: Secondary | ICD-10-CM | POA: Diagnosis not present

## 2018-09-30 DIAGNOSIS — G35 Multiple sclerosis: Secondary | ICD-10-CM | POA: Diagnosis not present

## 2018-09-30 DIAGNOSIS — E785 Hyperlipidemia, unspecified: Secondary | ICD-10-CM | POA: Diagnosis not present

## 2018-09-30 DIAGNOSIS — E1169 Type 2 diabetes mellitus with other specified complication: Secondary | ICD-10-CM | POA: Diagnosis not present

## 2018-09-30 DIAGNOSIS — I10 Essential (primary) hypertension: Secondary | ICD-10-CM | POA: Diagnosis not present

## 2018-09-30 DIAGNOSIS — G4730 Sleep apnea, unspecified: Secondary | ICD-10-CM | POA: Diagnosis not present

## 2018-09-30 DIAGNOSIS — R05 Cough: Secondary | ICD-10-CM | POA: Diagnosis not present

## 2018-10-02 ENCOUNTER — Telehealth: Payer: Self-pay | Admitting: *Deleted

## 2018-10-02 NOTE — Telephone Encounter (Signed)
Called patient to reschedule April 2020 due to Dr. Gerarda Fraction not being with our office any longer. Attempted to reschedule patient for April 23rd with Dr. Skeet Latch but patient states that she can not do any other day accept Friday mornings due to transportation.  I told patient that the [facility] [place] now has transportation and that we could arrange that for her if she was agreeable.  I told patient that our office would give her a call back after we talk with transportation to set up her appointment.  Patient states that she can come in March if we can provide transportation an schedule the appointment in the morning time because she has to pick up her grandchild in the evenings.  I told patient that our office would give her a call once we get her an appointment and transportation scheduled.  Patient verbalized understanding.

## 2018-10-03 ENCOUNTER — Encounter: Payer: Self-pay | Admitting: Family Medicine

## 2018-10-03 ENCOUNTER — Telehealth: Payer: Self-pay | Admitting: *Deleted

## 2018-10-03 NOTE — Telephone Encounter (Signed)
Patient called back and was rescheduled to see Dr. Fermin Schwab on 3/24. Patient aware that Ebony Hail will call her regarding transportation. Sent Ebony Hail an email regarding the patient

## 2018-10-03 NOTE — Telephone Encounter (Signed)
Called and left the patient a message to call the office back. Message stated that "we are able to arrange transportation for her on March 5th if agreeable, please call the office back."

## 2018-10-04 ENCOUNTER — Telehealth: Payer: Self-pay | Admitting: Gynecology

## 2018-10-04 NOTE — Telephone Encounter (Signed)
Called and spoke with patient regarding transportation for her 3/24 appointment.   Have set patient up with transportation and will have her sign a waiver once she is in the office.

## 2018-10-06 ENCOUNTER — Other Ambulatory Visit: Payer: Self-pay | Admitting: Family Medicine

## 2018-10-07 ENCOUNTER — Other Ambulatory Visit: Payer: Self-pay

## 2018-10-07 ENCOUNTER — Emergency Department (HOSPITAL_BASED_OUTPATIENT_CLINIC_OR_DEPARTMENT_OTHER)
Admission: EM | Admit: 2018-10-07 | Discharge: 2018-10-07 | Disposition: A | Discharge status: Home / Self Care | Payer: Medicare HMO | Attending: Emergency Medicine | Admitting: Emergency Medicine

## 2018-10-07 ENCOUNTER — Encounter (HOSPITAL_BASED_OUTPATIENT_CLINIC_OR_DEPARTMENT_OTHER): Payer: Self-pay | Admitting: *Deleted

## 2018-10-07 DIAGNOSIS — Z87891 Personal history of nicotine dependence: Secondary | ICD-10-CM | POA: Diagnosis not present

## 2018-10-07 DIAGNOSIS — R079 Chest pain, unspecified: Secondary | ICD-10-CM | POA: Diagnosis not present

## 2018-10-07 DIAGNOSIS — I10 Essential (primary) hypertension: Secondary | ICD-10-CM | POA: Diagnosis not present

## 2018-10-07 DIAGNOSIS — Z8541 Personal history of malignant neoplasm of cervix uteri: Secondary | ICD-10-CM | POA: Insufficient documentation

## 2018-10-07 DIAGNOSIS — J209 Acute bronchitis, unspecified: Secondary | ICD-10-CM | POA: Diagnosis not present

## 2018-10-07 DIAGNOSIS — R9431 Abnormal electrocardiogram [ECG] [EKG]: Secondary | ICD-10-CM | POA: Diagnosis not present

## 2018-10-07 DIAGNOSIS — R072 Precordial pain: Secondary | ICD-10-CM | POA: Diagnosis not present

## 2018-10-07 DIAGNOSIS — R091 Pleurisy: Secondary | ICD-10-CM | POA: Diagnosis not present

## 2018-10-07 DIAGNOSIS — J45909 Unspecified asthma, uncomplicated: Secondary | ICD-10-CM | POA: Insufficient documentation

## 2018-10-07 NOTE — Discharge Instructions (Signed)

## 2018-10-07 NOTE — ED Notes (Signed)
Dr. Laverta Baltimore states he would discharge the patient and printed discharge instructions.  Pt had left prior to receiving them.

## 2018-10-07 NOTE — ED Triage Notes (Signed)
Last night she was having pain in her chest that felt like a pulled muscle. This am felt anxious. EKG NSR. She was seen at [facility] this am and had a normal exam but she wants a second opinion.

## 2018-10-07 NOTE — ED Notes (Signed)
EDP got called away from pt by a  Phone call from neurologist.  When edp left the room pt began to cry and told me that she is leaving.  Stated "I'm pissed.  I'll go to [employer]. I don't care."  Pt left the room saying "I'm not waiting for my papers.  I don't care if you sign me out AMA.  I'm leaving. I'm pissed."
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
## 2018-10-07 NOTE — ED Notes (Signed)
ED Provider at bedside. 

## 2018-10-08 ENCOUNTER — Ambulatory Visit (INDEPENDENT_AMBULATORY_CARE_PROVIDER_SITE_OTHER): Payer: Medicare HMO | Admitting: Family Medicine

## 2018-10-08 ENCOUNTER — Encounter: Payer: Self-pay | Admitting: Family Medicine

## 2018-10-08 VITALS — BP 138/72 | Wt 225.0 lb

## 2018-10-08 DIAGNOSIS — E876 Hypokalemia: Secondary | ICD-10-CM | POA: Diagnosis not present

## 2018-10-08 DIAGNOSIS — M25511 Pain in right shoulder: Secondary | ICD-10-CM

## 2018-10-08 DIAGNOSIS — M25531 Pain in right wrist: Secondary | ICD-10-CM | POA: Diagnosis not present

## 2018-10-08 DIAGNOSIS — R69 Illness, unspecified: Secondary | ICD-10-CM | POA: Diagnosis not present

## 2018-10-08 DIAGNOSIS — M2550 Pain in unspecified joint: Secondary | ICD-10-CM

## 2018-10-08 DIAGNOSIS — F418 Other specified anxiety disorders: Secondary | ICD-10-CM

## 2018-10-08 DIAGNOSIS — G8929 Other chronic pain: Secondary | ICD-10-CM

## 2018-10-08 DIAGNOSIS — M25532 Pain in left wrist: Secondary | ICD-10-CM | POA: Diagnosis not present

## 2018-10-08 DIAGNOSIS — F430 Acute stress reaction: Secondary | ICD-10-CM

## 2018-10-08 LAB — URIC ACID
Calcium: 9.1
EGFR (Non-African Amer.): >=90
SED RATE BY MODIFIED WESTERGREN,MANUAL: 20
Uric Acid: 4.4

## 2018-10-08 LAB — BASIC METABOLIC PANEL
BUN: 10 (ref 4–21)
Creatinine: 0.5 (ref 0.5–1.1)
Glucose: 129
Potassium: 3.5 (ref 3.4–5.3)
Sodium: 140 (ref 137–147)

## 2018-10-08 NOTE — Patient Instructions (Signed)
Take your antibiotic and make sure taking your ranitidine while on the antibiotic.

## 2018-10-08 NOTE — Progress Notes (Signed)
Subjective:    CC:   HPI:  Right shoulder pain - planning on surgery. Getting worse with reaching out..  She also c/o of more diffuse joint pain . Worse in her hands  and wrists, feet and shoulders lately.  She says she occasionally notices some swelling in her wrist.  But she is also had carpal tunnel before.  But she complains of her feet continuing to be extremely painful.  She is been seeing a specialist for that as well.  She says sometimes it so painful that she has to use a walker and sometimes will stay in bed.  She was recently seen in the ED.  She was c/o cough that is mild and some SOB and left sided chest pain.  She feels like the chest wall pain is worse when she takes a deep breath.  She is worried about the potential of a blood clot since she had to have multiple sticks for blood draws.  She has been having a mild cough with some persistent nasal congestion.  She did use Afrin for about 3 days that did seem to help.  She has been using her nasal saline rinse consistently.  She did start the antibiotics that she was given for bronchitis in the ED but says it has upset her stomach.  GERD-she is been having a little bit more regurgitation.  She has had a night when she had some chicken and rice pieces of it were coming back up in her throat.  She admits she has not been taking her ranitidine consistently.  She was actually supposed to have an upper endoscopy but had to cancel her appointment.  She is trying to reschedule it.  Past medical history, Surgical history, Family history not pertinant except as noted below, Social history, Allergies, and medications have been entered into the medical record, reviewed, and corrections made.   Review of Systems: No fevers, chills, night sweats, weight loss, chest pain, or shortness of breath.   Objective:    General: Well Developed, well nourished, and in no acute distress.  Neuro: Alert and oriented x3, extra-ocular muscles intact, sensation  grossly intact.  HEENT: Normocephalic, atraumatic, and canals are clear bilaterally.  No significant cervical lymphadenopathy.  She does have some fullness around her neck but no discrete induration or swelling. Skin: Warm and dry, no rashes. Cardiac: Regular rate and rhythm, no murmurs rubs or gallops, no lower extremity edema. Tender over left chest wall.  But no bruising or mass or lesions. Respiratory: Clear to auscultation bilaterally. Not using accessory muscles, speaking in full sentences.   Impression and Recommendations:    Right shoulder pain - Encouraged to reschedule her surgery.    Cough - she has started the antibiotic. Encouraged her to continue it.  Lungs do sound clear on exam today but she is getting some sputum production.  GERD - make sure taking her ranitidine daily as it sounds like she is getting some food regurgitation and brash.  Also encouraged her to call and reschedule her upper endoscopy.  SOB - lungs are clear on exam.  Gave her reassurance.  Acute stress  -a long discussion today about therapy.  Discussed working with a therapist.  She is open to it.  We have discussed this multiple times before.  Polyarthralgia-she is complaining more about joint pain recently versus muscle pain I do feel like she has a diagnosis consistent with fibromyalgia.  We will do some additional blood work today just to rule  out autoimmune causes for her arthritis.  I do not see any redness or swelling of the hand fingers or wrist joints today and indicative of rheumatoid.  But I will call her with those results.  Time spent 45 min, did than 50% of time spent face-to-face counseling about cough, right shoulder pain, reflux, shortness of breath, acute distress, polyarthralgia, fibromyalgia.

## 2018-10-17 ENCOUNTER — Ambulatory Visit: Payer: Self-pay | Admitting: Gynecologic Oncology

## 2018-10-17 ENCOUNTER — Encounter: Payer: Self-pay | Admitting: Family Medicine

## 2018-10-17 ENCOUNTER — Ambulatory Visit (INDEPENDENT_AMBULATORY_CARE_PROVIDER_SITE_OTHER): Payer: Medicare HMO | Admitting: Family Medicine

## 2018-10-17 VITALS — BP 156/95 | HR 89 | Temp 98.7°F | Ht 62.0 in | Wt 223.0 lb

## 2018-10-17 DIAGNOSIS — J029 Acute pharyngitis, unspecified: Secondary | ICD-10-CM | POA: Diagnosis not present

## 2018-10-17 DIAGNOSIS — R5383 Other fatigue: Secondary | ICD-10-CM | POA: Diagnosis not present

## 2018-10-17 DIAGNOSIS — J019 Acute sinusitis, unspecified: Secondary | ICD-10-CM | POA: Diagnosis not present

## 2018-10-17 DIAGNOSIS — G8929 Other chronic pain: Secondary | ICD-10-CM

## 2018-10-17 DIAGNOSIS — M25511 Pain in right shoulder: Secondary | ICD-10-CM

## 2018-10-17 DIAGNOSIS — M79673 Pain in unspecified foot: Secondary | ICD-10-CM | POA: Diagnosis not present

## 2018-10-17 LAB — POCT RAPID STREP A (OFFICE): Rapid Strep A Screen: NEGATIVE

## 2018-10-17 NOTE — Patient Instructions (Addendum)
Sure you are taking your ranitidine twice a day consistently. Try restarting your antibiotic. Also encourage you to take 500 mg of Tylenol twice a day for pain control. This will not hurt your stomach.    We can also consider Cymbalta which is a prescription for your pain as well.
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
## 2018-10-18 ENCOUNTER — Encounter: Payer: Self-pay | Admitting: Family Medicine

## 2018-10-21 ENCOUNTER — Encounter: Payer: Self-pay | Admitting: *Deleted

## 2018-10-21 ENCOUNTER — Encounter: Payer: Self-pay | Admitting: Family Medicine

## 2018-10-21 DIAGNOSIS — M25511 Pain in right shoulder: Secondary | ICD-10-CM | POA: Diagnosis not present

## 2018-10-21 DIAGNOSIS — G8929 Other chronic pain: Secondary | ICD-10-CM | POA: Diagnosis not present

## 2018-10-21 DIAGNOSIS — J069 Acute upper respiratory infection, unspecified: Secondary | ICD-10-CM | POA: Diagnosis not present

## 2018-10-21 DIAGNOSIS — Z87891 Personal history of nicotine dependence: Secondary | ICD-10-CM | POA: Diagnosis not present

## 2018-10-21 DIAGNOSIS — R110 Nausea: Secondary | ICD-10-CM | POA: Diagnosis not present

## 2018-10-21 LAB — CHG RHEUMATOID FACTOR QUALITATIVE
Cyclic Citrullin Peptide Ab: <5
Rheumatoid Arthritis Factor: NEGATIVE

## 2018-10-22 ENCOUNTER — Encounter: Payer: Self-pay | Admitting: Family Medicine

## 2018-10-22 ENCOUNTER — Ambulatory Visit (INDEPENDENT_AMBULATORY_CARE_PROVIDER_SITE_OTHER): Payer: Medicare HMO | Admitting: Family Medicine

## 2018-10-22 VITALS — BP 151/76 | HR 77 | Ht 62.0 in | Wt 224.0 lb

## 2018-10-22 DIAGNOSIS — G8929 Other chronic pain: Secondary | ICD-10-CM

## 2018-10-22 DIAGNOSIS — M79673 Pain in unspecified foot: Secondary | ICD-10-CM | POA: Diagnosis not present

## 2018-10-22 DIAGNOSIS — M25511 Pain in right shoulder: Secondary | ICD-10-CM

## 2018-10-22 DIAGNOSIS — R1084 Generalized abdominal pain: Secondary | ICD-10-CM | POA: Diagnosis not present

## 2018-10-22 DIAGNOSIS — R140 Abdominal distension (gaseous): Secondary | ICD-10-CM | POA: Diagnosis not present

## 2018-10-22 DIAGNOSIS — R1312 Dysphagia, oropharyngeal phase: Secondary | ICD-10-CM

## 2018-10-22 MED ORDER — GABAPENTIN 100 MG PO CAPS: 100.0000 mg | ORAL_CAPSULE | Freq: Every day | ORAL | 0 refills | Status: DC

## 2018-10-22 MED ORDER — FLUTICASONE PROPIONATE 50 MCG/ACT NA SUSP: 2.0000 | Freq: Every day | NASAL | 3 refills | Status: DC

## 2018-10-22 NOTE — Progress Notes (Signed)
Subjective:    CC:   HPI:  F/U bilateral foot pain - was dx with plantar fasciatis.  WE discussed  Starting cymbalta last time but she is worried that it may cause dizziness and tachycardia.  Says she would like to maybe discuss gabapentin.  It was given her for nerve pain at one point several years ago but she never actually took it. She ahs some scale on her feet and is worried that it could be tinea pedis.  But she denies any irritation or itching.  Just has a lot of pain and tenderness over the heel especially medially.  Her feet in diluted peroxide and alcohol   Has had URI sxs but still has some burning in the throat.  All feels like she is getting a little better.  She says the antibiotics did hurt her stomach so she never completed them.  She reports that she has been taking her ranitidine regularly but feels like her stomach still hurts and she is been having a lot of bloating.  She is scheduled for an EGD on April 1 with GI.  Continues to struggle with her right shoulder pain in fact she went to the emergency department since I last saw her because the pain was so unbearable.  She started to get decreased range of motion.  She knows that it needs surgery but has not scheduled it yet but is following back up with orthopedist this week.  She hasn't been sleeping well more recently.  She is not sure why.  Feeling like she is starting to have problems with swallowing again.  She actually had Botox injections done at one point but the physician who did that for her over at [name] left.  Past medical history, Surgical history, Family history not pertinant except as noted below, Social history, Allergies, and medications have been entered into the medical record, reviewed, and corrections made.   Review of Systems: No fevers, chills, night sweats, weight loss, chest pain, or shortness of breath.   Objective:    General: Well Developed, well nourished, and in no acute distress.  Neuro:  Alert and oriented x3, extra-ocular muscles intact, sensation grossly intact.  HEENT: Normocephalic, atraumatic  Skin: Warm and dry, no rashes. Cardiac: Regular rate and rhythm, no murmurs rubs or gallops, no lower extremity edema.  Respiratory: Clear to auscultation bilaterally. Not using accessory muscles, speaking in full sentences.   Impression and Recommendations:   Bilateral foot pain - again discussed options. She thinks she might be safer gabapentin.  So we discussed trying with a low dose just at bedtime and then gradually advancing from there.  New prescription sent to pharmacy.  Chronic right shoulder pain -ointment with Ortho.  She is likely need surgical intervention at this point  Abd pain and bloating -he is currently being evaluated by GI and has an appointment on April 1 for endoscopy.  Need to work on eating just small amounts and staying well-hydrated.  Time spent 40 min percent of time spent face-to-face counseling about bilateral foot pain, chronic right shoulder pain, abdominal pain and bloating.  Starting to get some new difficulty with swallowing.  For now recommend that she focus on getting her GI work-up in her right shoulder and we will can come back around to the swallowing unless she starts choking or gagging on foods or medications.

## 2018-10-24 ENCOUNTER — Telehealth: Payer: Self-pay | Admitting: *Deleted

## 2018-10-24 NOTE — Telephone Encounter (Signed)
Returned the patient's call and she cancelled her appt for 3/24. Patient stated "There was been a lot of sickness in my house lately, plus I need to have a procedure and surgery done." Patient will call back once she is ready to reschedule.

## 2018-10-26 DIAGNOSIS — R9431 Abnormal electrocardiogram [ECG] [EKG]: Secondary | ICD-10-CM | POA: Diagnosis not present

## 2018-10-26 DIAGNOSIS — R002 Palpitations: Secondary | ICD-10-CM | POA: Diagnosis not present

## 2018-10-26 DIAGNOSIS — R079 Chest pain, unspecified: Secondary | ICD-10-CM | POA: Diagnosis not present

## 2018-10-26 DIAGNOSIS — R072 Precordial pain: Secondary | ICD-10-CM | POA: Diagnosis not present

## 2018-10-26 DIAGNOSIS — R000 Tachycardia, unspecified: Secondary | ICD-10-CM | POA: Diagnosis not present

## 2018-10-26 DIAGNOSIS — M79644 Pain in right finger(s): Secondary | ICD-10-CM | POA: Diagnosis not present

## 2018-10-27 DIAGNOSIS — R000 Tachycardia, unspecified: Secondary | ICD-10-CM | POA: Diagnosis not present

## 2018-10-27 DIAGNOSIS — R9431 Abnormal electrocardiogram [ECG] [EKG]: Secondary | ICD-10-CM | POA: Diagnosis not present

## 2018-10-30 ENCOUNTER — Other Ambulatory Visit: Payer: Self-pay

## 2018-10-30 ENCOUNTER — Emergency Department (HOSPITAL_BASED_OUTPATIENT_CLINIC_OR_DEPARTMENT_OTHER)
Admission: EM | Admit: 2018-10-30 | Discharge: 2018-10-30 | Disposition: A | Discharge status: Home / Self Care | Payer: Medicare HMO | Attending: Emergency Medicine | Admitting: Emergency Medicine

## 2018-10-30 ENCOUNTER — Encounter (HOSPITAL_BASED_OUTPATIENT_CLINIC_OR_DEPARTMENT_OTHER): Payer: Self-pay

## 2018-10-30 DIAGNOSIS — Z87891 Personal history of nicotine dependence: Secondary | ICD-10-CM | POA: Diagnosis not present

## 2018-10-30 DIAGNOSIS — M25511 Pain in right shoulder: Secondary | ICD-10-CM | POA: Insufficient documentation

## 2018-10-30 DIAGNOSIS — J45909 Unspecified asthma, uncomplicated: Secondary | ICD-10-CM | POA: Insufficient documentation

## 2018-10-30 DIAGNOSIS — I10 Essential (primary) hypertension: Secondary | ICD-10-CM | POA: Diagnosis not present

## 2018-10-30 DIAGNOSIS — Z79899 Other long term (current) drug therapy: Secondary | ICD-10-CM | POA: Diagnosis not present

## 2018-10-30 DIAGNOSIS — R600 Localized edema: Secondary | ICD-10-CM | POA: Diagnosis present

## 2018-10-30 MED ORDER — LIDOCAINE 5 % EX PTCH: 1.0000 | MEDICATED_PATCH | CUTANEOUS | 0 refills | Status: DC

## 2018-10-30 NOTE — Discharge Instructions (Addendum)
Continue taking home medications as prescribed. Use lidocaine patch as needed for pain.  If this is too expensive, you may ask the pharmacist about over-the-counter lidocaine patch options. Follow-up with your orthopedic doctor at your scheduled appointment tomorrow. Continue using Tylenol, heat, and ice to help with pain control. Return to the emergency room with any new, worsening, concerning symptoms.

## 2018-10-30 NOTE — ED Triage Notes (Signed)
Pt states right upper arm swelling beginning last week, states has problem with right shoulder and will need to have surgery, but for past few days has had increased pain and swelling right arm, shoulder, and down into axilla and right chest wall.  Pt states concerned that she is forming a clot.
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
## 2018-10-31 DIAGNOSIS — G8929 Other chronic pain: Secondary | ICD-10-CM | POA: Diagnosis not present

## 2018-10-31 DIAGNOSIS — S43431A Superior glenoid labrum lesion of right shoulder, initial encounter: Secondary | ICD-10-CM | POA: Diagnosis not present

## 2018-10-31 DIAGNOSIS — M19011 Primary osteoarthritis, right shoulder: Secondary | ICD-10-CM | POA: Diagnosis not present

## 2018-11-05 ENCOUNTER — Other Ambulatory Visit: Payer: Self-pay

## 2018-11-05 ENCOUNTER — Encounter: Payer: Self-pay | Admitting: Family Medicine

## 2018-11-05 ENCOUNTER — Ambulatory Visit: Payer: Self-pay | Admitting: Gynecology

## 2018-11-05 ENCOUNTER — Ambulatory Visit (INDEPENDENT_AMBULATORY_CARE_PROVIDER_SITE_OTHER): Payer: Medicare HMO | Admitting: Family Medicine

## 2018-11-05 VITALS — BP 148/88 | HR 80 | Ht 62.0 in | Wt 226.0 lb

## 2018-11-05 DIAGNOSIS — E611 Iron deficiency: Secondary | ICD-10-CM | POA: Diagnosis not present

## 2018-11-05 DIAGNOSIS — M25511 Pain in right shoulder: Secondary | ICD-10-CM | POA: Diagnosis not present

## 2018-11-05 DIAGNOSIS — G8929 Other chronic pain: Secondary | ICD-10-CM

## 2018-11-05 DIAGNOSIS — E876 Hypokalemia: Secondary | ICD-10-CM | POA: Diagnosis not present

## 2018-11-05 DIAGNOSIS — K220 Achalasia of cardia: Secondary | ICD-10-CM

## 2018-11-05 DIAGNOSIS — I10 Essential (primary) hypertension: Secondary | ICD-10-CM

## 2018-11-05 DIAGNOSIS — M79673 Pain in unspecified foot: Secondary | ICD-10-CM | POA: Diagnosis not present

## 2018-11-05 MED ORDER — MOMETASONE FUROATE 50 MCG/ACT NA SUSP: 1.0000 | Freq: Every day | NASAL | 12 refills | Status: DC

## 2018-11-05 MED ORDER — AMLODIPINE BESYLATE 2.5 MG PO TABS: 2.5000 mg | ORAL_TABLET | Freq: Every day | ORAL | 1 refills | Status: DC

## 2018-11-05 NOTE — Progress Notes (Signed)
Subjective:    CC: Multiple medical problems.    HPI:  Still complains of bilateral foot pain.  Is getting to the point where it really is getting hard to walk.  Infection has a walker at home that she uses sometimes.  Today she is using a cane.  It is worse the longer she sits and then gets up on her feet she was actually hoping to get back in with Dr. Georgina Snell 1 of our sports med docs today but he is actually out of the office today.  She also complains of persistent nasal congestion.  She has been using Flonase but says it does not seem to be helping.  She is open to trying something different.  She also complains of persistent right shoulder pain.  In fact she has been to the ED for a couple times recently and they have recommended referral to pain clinic.  She is awaiting to hear back from an appointment for them.  More recently she has been having some low back pain.  That a couple times she is had to have her husband help her get back up out of bed.  She still having problems swallowing and feeling like she will choke or gag after she eats and then feels like some of her food comes back up.  She has been taking her reflux medicine some but not consistently.  Past medical history, Surgical history, Family history not pertinant except as noted below, Social history, Allergies, and medications have been entered into the medical record, reviewed, and corrections made.   Review of Systems: No fevers, chills, night sweats, weight loss, chest pain, or shortness of breath.   Objective:    General: Well Developed, well nourished, and in no acute distress.  Neuro: Alert and oriented x3, extra-ocular muscles intact, sensation grossly intact.  HEENT: Normocephalic, atraumatic, OP is clear, TMS and canals are clear bilaterally.   Skin: Warm and dry, no rashes. Cardiac: Regular rate and rhythm, no murmurs rubs or gallops, no lower extremity edema.  Respiratory: Clear to auscultation bilaterally. Not  using accessory muscles, speaking in full sentences.   Impression and Recommendations:    Bilateral foot pain -I encouraged her to start the gabapentin and give it a try it may help reduce her pain some.  Nasal congestion/AR -continue Flonase and switch to Nasonex.  New prescription sent to pharmacy.  HTN - will add a low dose of norvasc 2.5 mg follow BP at home.    Right shoulder pain-ultimately she needs surgery but that would likely be pushed off with the current pandemic going on.  Did encourage her to start the gabapentin I think it can be helpful for her feet in addition to her shoulder and they referring her to pain management.  Cricopharyngeal achalasia and GERD - recommend daily PPI.

## 2018-11-06 ENCOUNTER — Ambulatory Visit: Payer: Self-pay | Admitting: Family Medicine

## 2018-11-08 ENCOUNTER — Ambulatory Visit (INDEPENDENT_AMBULATORY_CARE_PROVIDER_SITE_OTHER): Payer: Medicare HMO | Admitting: Family Medicine

## 2018-11-08 ENCOUNTER — Other Ambulatory Visit: Payer: Self-pay

## 2018-11-08 ENCOUNTER — Telehealth: Payer: Self-pay

## 2018-11-08 ENCOUNTER — Encounter: Payer: Self-pay | Admitting: Family Medicine

## 2018-11-08 VITALS — BP 150/80 | HR 80 | Temp 98.1°F

## 2018-11-08 DIAGNOSIS — R0602 Shortness of breath: Secondary | ICD-10-CM

## 2018-11-08 DIAGNOSIS — J069 Acute upper respiratory infection, unspecified: Secondary | ICD-10-CM | POA: Diagnosis not present

## 2018-11-08 DIAGNOSIS — I10 Essential (primary) hypertension: Secondary | ICD-10-CM

## 2018-11-08 DIAGNOSIS — R51 Headache: Secondary | ICD-10-CM

## 2018-11-08 DIAGNOSIS — R519 Headache, unspecified: Secondary | ICD-10-CM

## 2018-11-08 NOTE — Telephone Encounter (Signed)
Jennifer [name] complains of cough, wheezing and headache. Schedule for this afternoon.

## 2018-11-08 NOTE — Progress Notes (Signed)
Virtual Visit via Video Note  I connected with Jennifer [name] on 11/08/18 at  4:20 PM EDT by a video enabled telemedicine application and verified that I am speaking with the correct person using two identifiers.   I discussed the limitations of evaluation and management by telemedicine and the availability of in person appointments. The patient expressed understanding and agreed to proceed.  Subjective:    CC: ST  HPI:  Yesterday started having a ST (waxing and waning in intensity)  and now has been coughing and felt she has been wheezing. She can feel the vibration in her chest.  She feels she is getting worse. She has a bad HA which is right sided frontal. Cough is some dry and some productive. Tightness in her chest. Has been staying at home.  Yesterday pulse ox was 98% but today was 95%. Went to bed early today. Fetlt pulse was low last night. Today in the 80s. Has some loose stools yesterday.  Used her inhaler yesterday, 2 puffs, but not sure it helped.  She says she plans on picking up some guaifenesin and maybe starting some Afrin because she is also having a lot of nasal congestion.  Hypertension-she is been mostly laying in bed today.  Taking meds as directed w/o problems.  Denies medication side effects.      Past medical history, Surgical history, Family history not pertinant except as noted below, Social history, Allergies, and medications have been entered into the medical record, reviewed, and corrections made.   Review of Systems: No fevers, chills, night sweats, weight loss.   Objective:    General: Speaking clearly in complete sentences without any shortness of breath.  Alert and oriented x3.  Normal judgment. No apparent acute distress.  Patient was walking around while talking.  She did point to the right side of her head on the top when she was complaining about the headache.   Impression and Recommendations:   Upper respiratory illness-likely viral witht SOB.   Discussed that it could be a mild form of coded.  Recommend that she self quarantine.  Recommend that she get a new thermometer may be her husband to go pick that up for her since she feels like the one that she is has is not working very well.  Recommend continue with symptomatic care.  Reassured her to use her pulse oximeter and check her pulse ox at least twice a day.  Also encouraged her not to continue to lay in bed which is what she has been doing for the last week.  And really get up and move around.  She does have a lot of bilateral foot pain so she is not able to move around and encouraged her to at least sit up on the edge of the bed and take several deep breaths.  If she is noticing some wheezing in her chest explained that oftentimes this is from mucus or little bit of bronchospasm.  Encouraged her to use her albuterol 2-3 times a day as needed.  If she feels like she is getting worse and encouraged her to call the on-call nurse this weekend before going to the emergency department but if needed she can certainly do so.  Hypertension-blood pressure quite elevated today.  She is had some decreased p.o. intake as well as water intake today so I am hesitant to increase her medication at this time but just encouraged her to continue to monitor this.  Did encourage her to push fluids  today  Headache-also gave her reassurance.  Try to hydrate as she has not had much to eat or drink today.  Try to also sit upright for a little while as she has been laying in bed.  Make sure taking medications regularly.  Time spent 30 minutes face-to-face on virtual visit reassuring and discussing upper respiratory illness, shortness of breath, and hypertension..   I discussed the assessment and treatment plan with the patient. The patient was provided an opportunity to ask questions and all were answered. The patient agreed with the plan and demonstrated an understanding of the instructions.   The patient was advised  to call back or seek an in-person evaluation if the symptoms worsen or if the condition fails to improve as anticipated.    Beatrice Lecher, MD

## 2018-11-10 DIAGNOSIS — E669 Obesity, unspecified: Secondary | ICD-10-CM | POA: Diagnosis not present

## 2018-11-10 DIAGNOSIS — R9431 Abnormal electrocardiogram [ECG] [EKG]: Secondary | ICD-10-CM | POA: Diagnosis not present

## 2018-11-10 DIAGNOSIS — Z87891 Personal history of nicotine dependence: Secondary | ICD-10-CM | POA: Diagnosis not present

## 2018-11-10 DIAGNOSIS — R072 Precordial pain: Secondary | ICD-10-CM | POA: Diagnosis not present

## 2018-11-10 DIAGNOSIS — I10 Essential (primary) hypertension: Secondary | ICD-10-CM | POA: Diagnosis not present

## 2018-11-10 DIAGNOSIS — R0602 Shortness of breath: Secondary | ICD-10-CM | POA: Diagnosis not present

## 2018-11-10 DIAGNOSIS — R69 Illness, unspecified: Secondary | ICD-10-CM | POA: Diagnosis not present

## 2018-11-10 DIAGNOSIS — J3089 Other allergic rhinitis: Secondary | ICD-10-CM | POA: Diagnosis not present

## 2018-11-10 DIAGNOSIS — R5383 Other fatigue: Secondary | ICD-10-CM | POA: Diagnosis not present

## 2018-11-10 DIAGNOSIS — R002 Palpitations: Secondary | ICD-10-CM | POA: Diagnosis not present

## 2018-11-11 ENCOUNTER — Institutional Professional Consult (permissible substitution): Payer: Self-pay | Admitting: Cardiology

## 2018-11-11 ENCOUNTER — Telehealth: Payer: Self-pay

## 2018-11-11 DIAGNOSIS — R9431 Abnormal electrocardiogram [ECG] [EKG]: Secondary | ICD-10-CM | POA: Diagnosis not present

## 2018-11-11 MED ORDER — LEVALBUTEROL TARTRATE 45 MCG/ACT IN AERO: 2.0000 | INHALATION_SPRAY | Freq: Four times a day (QID) | RESPIRATORY_TRACT | 1 refills | Status: DC | PRN

## 2018-11-11 NOTE — Telephone Encounter (Signed)
rx sent

## 2018-11-11 NOTE — Telephone Encounter (Signed)
Jennifer [name] called and wants a refill on Xopenex inhaler. This medication has never been prescribed by Dr Madilyn Fireman. Please advise.

## 2018-11-12 NOTE — Telephone Encounter (Signed)
Pt advised of recommendations. My chart video visit set up for tomorrow.Maryruth Eve, Lahoma Crocker, CMA

## 2018-11-12 NOTE — Telephone Encounter (Signed)
Pt advised RX sent.   Reports having some pain on right side and still feels a little under the weather. Concerned that she cant tell if its coming from lung pain or shoulder pain. Reports pain shoots from front to back. When looking in the mirror, feels shoulder looks swollen and "more down". She is concerned this is a new pain. No other SX nor concerns.  Pt states she tried to contact ortho about the pain but hasn't been able to get in touch with anyone there. Wanting to know what she should do.

## 2018-11-12 NOTE — Telephone Encounter (Signed)
Pt called back crying saying that she is in so much pain and is not sure if it is in her lung or shoulder. I asked how long? She said for 2 days. When she coughs she feels heavy on the R side. Denies fever, cough is wet, and it hurts really bad on R side. She said that she had been lying in bed but had been trying to sit up and walk. When she takes deep breaths it hurts.  She did speak w/the orthopedist and they didn't give her any suggestions.   She said that she did try alternating between the ice and heat. She said that you can see a difference between her arms. She said that it started in the front at the R around the armpit 2" over and goes thru her back she said that the pains don't happen at the same time and she is unable to sleep and her arm is weaker. She feels that her lung is weaker and she has coughing fits. She has had some tingling in her fingers and the pain is constant. She has not started the Gabapentin   She said that she feels "funky in her chest"  I asked her if she has tried her xopenex and she said that she did and she has also tried a nebulizer txt but believes that the albuterol was outdated. No f/s/c/n/v. Only cough and headache.   She is worried if this is her lung. She thinks/feels that it could be bronchitis or pneumonia. I asked her what she wanted to do? I told her that I would fwd to pcp .Marland KitchenElouise Munroe, [place]

## 2018-11-12 NOTE — Telephone Encounter (Signed)
lvm advising pt to try alternating between ice and heat and hopefully she has heard back from the ortho's office. I also said that from what she described it didn't sound like it is coming from her lung since she is not having any SOB. Marland KitchenMaryruth Eve, Lahoma Crocker, CMA

## 2018-11-12 NOTE — Telephone Encounter (Signed)
I agree.  This sounds like it is mostly coming from her shoulder I do not think it is probably related to the cough directly.  Though certainly that can strain the muscles around the chest wall.  I recommend that she continue to self quarantine at home.  I be happy to do a virtual visit with her tomorrow if she would like we can even do it through my chart now which is much easier.

## 2018-11-13 ENCOUNTER — Encounter: Payer: Self-pay | Admitting: Family Medicine

## 2018-11-13 ENCOUNTER — Telehealth (INDEPENDENT_AMBULATORY_CARE_PROVIDER_SITE_OTHER): Payer: Medicare HMO | Admitting: Family Medicine

## 2018-11-13 ENCOUNTER — Other Ambulatory Visit: Payer: Self-pay

## 2018-11-13 VITALS — BP 162/86 | HR 88 | Temp 98.1°F | Wt 223.0 lb

## 2018-11-13 DIAGNOSIS — M79673 Pain in unspecified foot: Secondary | ICD-10-CM | POA: Diagnosis not present

## 2018-11-13 DIAGNOSIS — R05 Cough: Secondary | ICD-10-CM | POA: Diagnosis not present

## 2018-11-13 DIAGNOSIS — R1310 Dysphagia, unspecified: Secondary | ICD-10-CM

## 2018-11-13 DIAGNOSIS — M25512 Pain in left shoulder: Secondary | ICD-10-CM

## 2018-11-13 DIAGNOSIS — I10 Essential (primary) hypertension: Secondary | ICD-10-CM | POA: Diagnosis not present

## 2018-11-13 DIAGNOSIS — R51 Headache: Secondary | ICD-10-CM | POA: Diagnosis not present

## 2018-11-13 DIAGNOSIS — G8929 Other chronic pain: Secondary | ICD-10-CM | POA: Diagnosis not present

## 2018-11-13 DIAGNOSIS — R059 Cough, unspecified: Secondary | ICD-10-CM

## 2018-11-13 DIAGNOSIS — R519 Headache, unspecified: Secondary | ICD-10-CM

## 2018-11-13 NOTE — Progress Notes (Signed)
Virtual Visit via Video Note  I connected with Jennifer [name] on 11/13/18 at 10:30 AM EDT by a video enabled telemedicine application and verified that I am speaking with the correct person using two identifiers.    I discussed the limitations of evaluation and management by telemedicine and the availability of in person appointments. The patient expressed understanding and agreed to proceed.  Subjective:    CC: cough and shoulder pain.   HPI:  Having a lot of pain in her left shoulder and into the axilla and feels like it is down into the inner arm.  It looks like the shoulder and upper arm is swollen.  Feels like it is aching.  Pain is throbbing.  No known trauma or inury. Has had problems in the past with left shoulder. She is actually scheduled for surgery on her Right shoulder at the end of the month but will likely be canceled.    HTN - says top BP have been 150-160s. She is concerned still running high.  Taking the amlodipine at night.  Has been having a right sided HA as well.    Cough is more productive but not as frequent. Says her left lungs feels "heavy".  Worse with deep breath.  Feels better when laying down.  Pain the shoulder blade and down to the kidneys that is persistant. Not as SOB.  Feeling better.  Using inhaler some.  Her husband is still working and in and out of the house.    Did go to the ED on 11/10/2018 for chest pain and BP.  Had a normal EKG.    Still hasn't tried the gabapentin yet bc wants a family member home to try it.    Past medical history, Surgical history, Family history not pertinant except as noted below, Social history, Allergies, and medications have been entered into the medical record, reviewed, and corrections made.   Review of Systems: No fevers, chills, night sweats, weight loss, chest pain, or shortness of breath.   Objective:    General: Speaking clearly in complete sentences without any shortness of breath.  Alert and oriented x3.   Normal judgment. No apparent acute distress.     Impression and Recommendations:   Left shoulder pain and upper upper axillary pain -  Likely MSK in nature. Doesn't sound cardiac. Gave her reassurance that this not her heart.  Recommend ice, gentle stretches, etc.    Cough - work on deep breathing and using flutter valve.  Pulse is normal and reassuring.  She sounds better on the phone today.    HTN - continue the amlodipine and metoprolol.  I would recommend increase amlodipine to 5mg . She is very hesitant.  Encouraged more exercise.    Bilateral foot pain - tried the inserts in her shoes again.   Want he to try the gabapentin. Encouraged her to try. It could reduce her pain and help improve her functionality.     Dysphagia - her EGD was canceled for now.  She has started her carafate to help for now.     I discussed the assessment and treatment plan with the patient. The patient was provided an opportunity to ask questions and all were answered. The patient agreed with the plan and demonstrated an understanding of the instructions.   The patient was advised to call back or seek an in-person evaluation if the symptoms worsen or if the condition fails to improve as anticipated.   Beatrice Lecher, MD

## 2018-11-14 ENCOUNTER — Ambulatory Visit (INDEPENDENT_AMBULATORY_CARE_PROVIDER_SITE_OTHER): Payer: Medicare HMO

## 2018-11-14 ENCOUNTER — Encounter: Payer: Self-pay | Admitting: Family Medicine

## 2018-11-14 ENCOUNTER — Ambulatory Visit (INDEPENDENT_AMBULATORY_CARE_PROVIDER_SITE_OTHER): Payer: Medicare HMO | Admitting: Family Medicine

## 2018-11-14 VITALS — BP 158/86 | HR 89

## 2018-11-14 DIAGNOSIS — M79671 Pain in right foot: Secondary | ICD-10-CM | POA: Diagnosis not present

## 2018-11-14 DIAGNOSIS — M79674 Pain in right toe(s): Secondary | ICD-10-CM | POA: Diagnosis not present

## 2018-11-14 DIAGNOSIS — S99921A Unspecified injury of right foot, initial encounter: Secondary | ICD-10-CM | POA: Diagnosis not present

## 2018-11-14 DIAGNOSIS — M25511 Pain in right shoulder: Secondary | ICD-10-CM

## 2018-11-14 NOTE — Patient Instructions (Signed)
Thank you for coming in today. Buddy tape the toes and use the cam walker boot.  Recheck via WebEx visit in 1 week.  Return sooner if needed.    How to Buddy Tape  Buddy taping refers to taping an injured finger or toe to an uninjured finger or toe that is next to it. This protects the injured finger or toe and keeps it from moving while the injury heals. You may buddy tape a finger or toe if you have a minor sprain. Your health care provider may buddy tape your finger or toe if you have a sprain, dislocation, or fracture. You may be told to replace your buddy taping as needed. What are the risks? Generally, buddy taping is safe. However, problems may occur, such as:  Skin injury or infection.  Reduced blood flow to the finger or toe.  Skin reaction to the tape. Do not buddy tape your toe if you have diabetes. Do not buddy tape if you know that you have an allergy to adhesives or surgical tape. How to buddy tape Before Buddy Taping Try to reduce any pain and swelling with rest, icing, and elevation:  Avoid any activity that causes pain.  Raise (elevate) your hand or foot above the level of your heart while you are sitting or lying down.  If directed, apply ice to the injured area: ? Put ice in a plastic bag. ? Place a towel between your skin and the bag. ? Leave the ice on for 20 minutes, 2-3 times per day. Buddy Taping Procedure  Clean and dry your finger or toe as told by your health care provider.  Place a gauze pad or a piece of cloth or cotton between your injured finger or toe and the uninjured finger or toe.  Use tape to wrap around both fingers or toes so your injured finger or toe is secured to the uninjured finger or toe. ? The tape should be snug, but not tight. ? Make sure the ends of the piece of tape overlap. ? Avoid placing tape directly over the joint.  Change the tape and the padding as told by your health care provider. Remove and replace the tape or  padding if it becomes loose, worn, dirty, or wet. After Buddy Taping  Take over-the-counter and prescription medicines only as told by your health care provider.  Return to your normal activities as told by your health care provider. Ask your health care provider what activities are safe for you.  Watch the buddy-taped area and always remove buddy taping if: ? Your pain gets worse. ? Your fingers turn pale or blue. ? Your skin becomes irritated. Contact a health care provider if:  You have pain, swelling, or bruising that lasts longer than three days.  You have a fever.  Your skin is red, cracked, or irritated. Get help right away if:  The injured area becomes cold, numb, or pale.  You have severe pain, swelling, bruising, or loss of movement in your finger or toe.  Your finger or toe changes shape (deformity). This information is not intended to replace advice given to you by your health care provider. Make sure you discuss any questions you have with your health care provider. Document Released: 09/07/2004 Document Revised: 01/06/2016 Document Reviewed: 12/23/2014 Elsevier Interactive Patient Education  2019 [employer].
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
## 2018-11-15 ENCOUNTER — Ambulatory Visit: Payer: Self-pay | Admitting: Obstetrics

## 2018-11-18 DIAGNOSIS — R1110 Vomiting, unspecified: Secondary | ICD-10-CM | POA: Diagnosis not present

## 2018-11-18 DIAGNOSIS — R0781 Pleurodynia: Secondary | ICD-10-CM | POA: Diagnosis not present

## 2018-11-18 DIAGNOSIS — R0989 Other specified symptoms and signs involving the circulatory and respiratory systems: Secondary | ICD-10-CM | POA: Diagnosis not present

## 2018-11-18 DIAGNOSIS — R05 Cough: Secondary | ICD-10-CM | POA: Diagnosis not present

## 2018-11-18 DIAGNOSIS — R0602 Shortness of breath: Secondary | ICD-10-CM | POA: Diagnosis not present

## 2018-11-19 ENCOUNTER — Other Ambulatory Visit: Payer: Self-pay

## 2018-11-19 ENCOUNTER — Encounter: Payer: Self-pay | Admitting: Family Medicine

## 2018-11-19 ENCOUNTER — Telehealth (INDEPENDENT_AMBULATORY_CARE_PROVIDER_SITE_OTHER): Payer: Medicare HMO | Admitting: Family Medicine

## 2018-11-19 VITALS — BP 128/78 | HR 80 | Temp 96.8°F | Wt 223.0 lb

## 2018-11-19 DIAGNOSIS — M79674 Pain in right toe(s): Secondary | ICD-10-CM | POA: Diagnosis not present

## 2018-11-19 DIAGNOSIS — T17908A Unspecified foreign body in respiratory tract, part unspecified causing other injury, initial encounter: Secondary | ICD-10-CM

## 2018-11-19 DIAGNOSIS — F430 Acute stress reaction: Secondary | ICD-10-CM

## 2018-11-19 DIAGNOSIS — I10 Essential (primary) hypertension: Secondary | ICD-10-CM | POA: Diagnosis not present

## 2018-11-19 DIAGNOSIS — M25511 Pain in right shoulder: Secondary | ICD-10-CM

## 2018-11-19 DIAGNOSIS — R69 Illness, unspecified: Secondary | ICD-10-CM | POA: Diagnosis not present

## 2018-11-19 NOTE — Progress Notes (Signed)
lvm asking pt to rtn call to get her VS and go over Medications.Jennifer [name], Jennifer [name], CMA

## 2018-11-19 NOTE — Progress Notes (Signed)
Patient states she stopped her Amlodipine the last two days. She feels like it is making her heart race. She hasn't started her Gabapentin yet, she wants to discuss with Dr. Madilyn Fireman.  Ezequiel Kayser. CMA

## 2018-11-19 NOTE — Progress Notes (Addendum)
Virtual Visit via Video Note  I connected with Jennifer [name] on 11/19/18 at  1:20 PM EDT by a video enabled telemedicine application and verified that I am speaking with the correct person using two identifiers.   I discussed the limitations of evaluation and management by telemedicine and the availability of in person appointments. The patient expressed understanding and agreed to proceed.  Subjective:    CC: went to the ED.    HPI:  She has been struggling this week. She was eating yogurt with M&Ms and starting talking and aspirated. Then kept coughing and then vomited. She went to UC. Was having some "jabbing" pain before she aspirated in her lungs. Chest xray is normal.    HTN - her BP was high in the ED.   She stopped her amlodipine the last 2 days. She has been walking some for exercise.   Still having some pain and swelling, bruising over he right foot. Did see Dr. Georgina Snell for it and didn't have fracture.  She says is still painful to walk on it.  She has felt stressed and overwhelmed. She became tearful on the phone. She is missing her granddaughter.  I have been using Skype but does not like seeing her in person.  She is been most 3 to 4 weeks since she is actually been able to see her.  Her right shoulder is getting worse. Getting mor grinding and dec ROM.  Actually considering an injection at this point as surgery has been delayed.  Past medical history, Surgical history, Family history not pertinant except as noted below, Social history, Allergies, and medications have been entered into the medical record, reviewed, and corrections made.   Review of Systems: No fevers, chills, night sweats, weight loss, chest pain, or shortness of breath.   Objective:    General: Speaking clearly in complete sentences without any shortness of breath.  Alert and oriented x3.  Normal judgment. No apparent acute distress. Well groomed.    Impression and Recommendations:   Aspirated yesterday  - she is doing well. No pain with swallowing.  She some chest discomfort.  Just gave her reassurance.  Monitor for fever of the next couple days if she develops any new symptoms then please let me know.  Make sure chewing food well.  She was actually due for an EGD to look for esophageal strictures but that had to be canceled so just make sure that she is taking her time when eating.  HTN - BP good today. She feels like she is more tachy on it. Plans to retry it.   Keep up the exercise.    Pain in right toe - continue to elevate, ice, etc. No fracture.  Can follow-up with Dr. Georgina Snell if not continuing to improve.  Acute stress/Anxiety - has been having some increased tearfulness and felt SOB.  Offered [place] referral with Janett Billow, et.  Referral was originally placed in February but at the time she felt like she was too busy to go but says she might be interested in it now.  I will go ahead and contact them and see if they would be willing to call her back and try to get her scheduled.  I think this could help her through everything that she is going through right now.   Right shoulder pain-wanted to know if can have a steroid shot for her joint with her blood sugars.  Right now her A1c is in a good place specially she continues  to watch what she is eating and so I think if she wanted a steroid injection that would be perfectly fine.   I discussed the assessment and treatment plan with the patient. The patient was provided an opportunity to ask questions and all were answered. The patient agreed with the plan and demonstrated an understanding of the instructions.   The patient was advised to call back or seek an in-person evaluation if the symptoms worsen or if the condition fails to improve as anticipated.  Time spent 40 min for DOS.    Beatrice Lecher, MD

## 2018-11-21 ENCOUNTER — Telehealth: Payer: Self-pay | Admitting: Family Medicine

## 2018-11-26 ENCOUNTER — Ambulatory Visit: Payer: Self-pay | Admitting: Family Medicine

## 2018-11-26 ENCOUNTER — Encounter: Payer: Self-pay | Admitting: Family Medicine

## 2018-11-26 ENCOUNTER — Ambulatory Visit (INDEPENDENT_AMBULATORY_CARE_PROVIDER_SITE_OTHER): Payer: Medicare HMO | Admitting: Family Medicine

## 2018-11-26 VITALS — BP 134/87 | HR 72 | Temp 97.7°F | Ht 62.0 in

## 2018-11-26 DIAGNOSIS — G43809 Other migraine, not intractable, without status migrainosus: Secondary | ICD-10-CM

## 2018-11-26 DIAGNOSIS — R110 Nausea: Secondary | ICD-10-CM

## 2018-11-26 DIAGNOSIS — J301 Allergic rhinitis due to pollen: Secondary | ICD-10-CM | POA: Diagnosis not present

## 2018-11-26 DIAGNOSIS — R1013 Epigastric pain: Secondary | ICD-10-CM | POA: Diagnosis not present

## 2018-11-26 DIAGNOSIS — I10 Essential (primary) hypertension: Secondary | ICD-10-CM | POA: Diagnosis not present

## 2018-11-26 MED ORDER — LANSOPRAZOLE 15 MG PO CPDR: 15.0000 mg | DELAYED_RELEASE_CAPSULE | Freq: Two times a day (BID) | ORAL | 1 refills | Status: DC | PRN

## 2018-11-26 MED ORDER — ONDANSETRON HCL 4 MG PO TABS: 4.0000 mg | ORAL_TABLET | Freq: Three times a day (TID) | ORAL | 0 refills | Status: DC | PRN

## 2018-11-26 NOTE — Progress Notes (Signed)
Pt said that her sxs started yesteday. She felt weak, nauseated not to long after waking. She has taken carefate. She said yesterday she had 2 "decent" BM.  She said its more of a deep discomfort and it is about 4 fingers down from her sternum.   She hasn't been drinking a lot of fluids. She wonders if there is something going on with the "vessles" in her stomach.Jennifer [name], [place]

## 2018-11-26 NOTE — Progress Notes (Signed)
Virtual Visit via Telephone Note  I connected with Jennifer [name] on 11/26/18 at  2:00 PM EDT by telephone and verified that I am speaking with the correct person using two identifiers.   I discussed the limitations, risks, security and privacy concerns of performing an evaluation and management service by telephone and the availability of in person appointments. I also discussed with the patient that there may be a patient responsible charge related to this service. The patient expressed understanding and agreed to proceed.   Subjective:    CC: Nausea and abdominal pain  HPI: Jennifer [name] was feeling tired and weak when standing up so mostly has laid on the cough.  Has been using her carafate.  Very nauseated and her BP has been high.  She feels like someone has "punched" her  Into the stomach.  Just below her sternum. Feels like has a sensation over her head and it is "messing with her senses" like her vision.  Had 2 BMs normal.  No diarrhea.  Only had small amt of stools, but doesn't feel constipated. Feels extremely tired. She is taking her ranitidine.  She had her gallbladder removed. She has had some bag salad.  Not as bloated. Denies any dizziness. She feels some tenderness about 4 finger breath below her sternum.     Felt SOB some yesterday and has had the windows open.  Has had still some wet cough. Noticed some mild wheeze last night as well.  Hx of migraines when she was younger especially when she was a smoker.   Her right shoulder is still painful to do any movement at all.  GM died from an aneurysm in her "stomach".    Past medical history, Surgical history, Family history not pertinant except as noted below, Social history, Allergies, and medications have been entered into the medical record, reviewed, and corrections made.   Review of Systems: No fevers, chills, night sweats, weight loss, chest pain, or shortness of breath.   Objective:    General: Speaking clearly in  complete sentences without any shortness of breath.  Alert and oriented x3.  Normal judgment. No apparent acute distress.    Impression and Recommendations:    HTN - Ok to use half a tab of the metoprolol to lower her pressure. Feels like the amlodipine causes her heart to race.  BP seems to be coming down.  Recommend bland diet.  Make sure avoiding milk products.    Headache - with what feels like a fog over her brain that is coming in episodes.  Consider migraines? Seems to come and go.  Possible prior dx of MS?     Nausea and abdominal pain - likely gastritis. Recommend change to add a PPI. Will try prevacid. She may have some omepazole at home but not sure.  Will send over zofran as well.    Allergic rhinitis - recommend stay our of the pollen as well. I think this is worsening some of her sxs.      I discussed the assessment and treatment plan with the patient. The patient was provided an opportunity to ask questions and all were answered. The patient agreed with the plan and demonstrated an understanding of the instructions.   The patient was advised to call back or seek an in-person evaluation if the symptoms worsen or if the condition fails to improve as anticipated.  I provided 35 minutes of non-face-to-face time during this encounter.   Beatrice Lecher, MD

## 2018-11-27 ENCOUNTER — Ambulatory Visit (INDEPENDENT_AMBULATORY_CARE_PROVIDER_SITE_OTHER): Payer: Medicare HMO | Admitting: Family Medicine

## 2018-11-27 ENCOUNTER — Ambulatory Visit (HOSPITAL_BASED_OUTPATIENT_CLINIC_OR_DEPARTMENT_OTHER)
Admission: RE | Admit: 2018-11-27 | Discharge: 2018-11-27 | Disposition: A | Discharge status: Home / Self Care | Payer: Medicare HMO | Source: Ambulatory Visit | Attending: Family Medicine | Admitting: Family Medicine

## 2018-11-27 ENCOUNTER — Encounter: Payer: Self-pay | Admitting: Family Medicine

## 2018-11-27 ENCOUNTER — Other Ambulatory Visit: Payer: Self-pay

## 2018-11-27 VITALS — Wt 223.0 lb

## 2018-11-27 DIAGNOSIS — M7989 Other specified soft tissue disorders: Secondary | ICD-10-CM

## 2018-11-27 DIAGNOSIS — M25511 Pain in right shoulder: Secondary | ICD-10-CM

## 2018-11-27 DIAGNOSIS — E876 Hypokalemia: Secondary | ICD-10-CM | POA: Diagnosis not present

## 2018-11-27 DIAGNOSIS — R2231 Localized swelling, mass and lump, right upper limb: Secondary | ICD-10-CM | POA: Diagnosis not present

## 2018-11-27 NOTE — Patient Instructions (Addendum)
Thank you for coming in today. Please follow up with me tomorrow. We will take a closer look at your arm and likely do an injection for your shoulder.  Please call [phone] to schedule the doppler ultrasound.
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
## 2018-11-28 ENCOUNTER — Ambulatory Visit (INDEPENDENT_AMBULATORY_CARE_PROVIDER_SITE_OTHER): Payer: Medicare HMO | Admitting: Family Medicine

## 2018-11-28 ENCOUNTER — Telehealth: Payer: Self-pay | Admitting: Family Medicine

## 2018-11-28 ENCOUNTER — Encounter: Payer: Self-pay | Admitting: Family Medicine

## 2018-11-28 VITALS — BP 148/75 | HR 83

## 2018-11-28 DIAGNOSIS — M7989 Other specified soft tissue disorders: Secondary | ICD-10-CM

## 2018-11-28 DIAGNOSIS — M25511 Pain in right shoulder: Secondary | ICD-10-CM | POA: Diagnosis not present

## 2018-11-28 MED ORDER — HYDROCODONE-ACETAMINOPHEN 5-325 MG PO TABS: 1.0000 | ORAL_TABLET | Freq: Four times a day (QID) | ORAL | 0 refills | Status: DC | PRN
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
## 2018-11-28 NOTE — Patient Instructions (Signed)
Thank you for coming in today. I think the upper arm and armpit swelling is more due to larger muscles and a change in arm position due to shoulder blade muscle dysfunction.  "Scapular Dyskinesis"  Continue to try to use the arm and work on motion.   Call or go to the ER if you develop a large red swollen joint with extreme pain or oozing puss.   Recheck via video visit in 1 week to keep me updated.

## 2018-11-28 NOTE — Telephone Encounter (Signed)
Patient called back today complaining of shoulder pain following the injection.  Plan to send in hydrocodone.  Infection very unlikely as typically pain worsened several days after injection if the pain is due to infection.  Her symptoms are most likely due to steroid flare.  PDMP reviewed during this encounter.

## 2018-11-29 DIAGNOSIS — R232 Flushing: Secondary | ICD-10-CM | POA: Diagnosis not present

## 2018-11-29 DIAGNOSIS — R69 Illness, unspecified: Secondary | ICD-10-CM | POA: Diagnosis not present

## 2018-11-29 DIAGNOSIS — R51 Headache: Secondary | ICD-10-CM | POA: Diagnosis not present

## 2018-11-29 DIAGNOSIS — I10 Essential (primary) hypertension: Secondary | ICD-10-CM | POA: Diagnosis not present

## 2018-11-29 DIAGNOSIS — R0602 Shortness of breath: Secondary | ICD-10-CM | POA: Diagnosis not present

## 2018-11-29 NOTE — Progress Notes (Signed)
Prevacid has been approved through 08/14/2019. Pharmacy aware and form sent to scan.

## 2018-12-02 NOTE — Progress Notes (Addendum)
Virtual Visit via Video Note  I connected with Jennifer [name] on 12/02/18 at  8:10 AM EDT by a video enabled telemedicine application and verified that I am speaking with the correct person using two identifiers.   I discussed the limitations of evaluation and management by telemedicine and the availability of in person appointments. The patient expressed understanding and agreed to proceed.  Patient was at home and I was in office for visit.   Subjective:    CC: Still not feelng   HPI:  Had the injection in her shoulder and says had a hard time moving her shoulder initially but that is better.  She notices her face felt red and splotchy and felt hot to touch.    Pt stated that she has been experiencing "major" headaches. started with a few a week but not daily.  They are located at the top of her head but not at the front, towards the front. She said it gotten worse. She has been taking tylenol this has not helped. She has not noticed any visual changes. Worse as the day goes on.  Says she did changes brands of water and this has some type of minerals.  Hasn't ben drinking as much as water lately.   She also mentioned that every time she eats the stomach pain gets worse no matter what she eats, even when she takes the Carefate "it's like a bandaid when I take this". She can take the prevacid, but just not daily.  She ran out of her ranitidine.  She is using her carafate.    She talked about having mid back pain she said that it also correlates with her headaches.    She would like a refill of the Ranitidin syrup I tried to reorder it would not allow me to.   She is still having high BPs and still feeling a lot of chest fluttering.  Says has been running in the 160s.         Past medical history, Surgical history, Family history not pertinant except as noted below, Social history, Allergies, and medications have been entered into the medical record, reviewed, and corrections  made.   Review of Systems: No fevers, chills, night sweats, weight loss, chest pain, or shortness of breath.   Objective:    General: Speaking clearly in complete sentences without any shortness of breath.  Alert and oriented x3.  Normal judgment. No apparent acute distress.    Impression and Recommendations:    Chronic daily HA - could be triggered by dehydration and/or from recent increase in her BPs.   GERD -  Encouraged her to restart her  H2 blocker. The zantac is no longer available so will change to famotidine 40mg  QHS. To treat possible ulcer/gastritis. Try to take her PPI every other day.  Continue her carafate.    HTN -  Feels like BP is up and staying up and feels like her metoprolol isn't working. Work on getting BP down.  Add extra 25mg  of metoprolol midday for next 2 weeks and see if bp better.    Facial flushing - likely steroid effect but I don't think it is an allergic reaction.   Right shoulder pain - she actually has a little more range of motion. Didn't use the pain medication.    Paroxysmal atrial tachycardia - think going up on her metoprolol as cards had recommended would be helpful. They had recommended that she take an extra 25mg  short acting mid  days. She says se tried it for awhile but felt it wasn't helping so stopped. I think ths would help with he BP as well.   I discussed the assessment and treatment plan with the patient. The patient was provided an opportunity to ask questions and all were answered. The patient agreed with the plan and demonstrated an understanding of the instructions.   The patient was advised to call back or seek an in-person evaluation if the symptoms worsen or if the condition fails to improve as anticipated.   Time spent 45 min spent on encounter.   Beatrice Lecher, MD

## 2018-12-02 NOTE — Addendum Note (Signed)
Addended by: Beatrice Lecher D on: 12/02/2018 08:40 PM   Modules accepted: Level of Service

## 2018-12-03 ENCOUNTER — Ambulatory Visit (INDEPENDENT_AMBULATORY_CARE_PROVIDER_SITE_OTHER): Payer: Medicare HMO | Admitting: Family Medicine

## 2018-12-03 ENCOUNTER — Emergency Department (HOSPITAL_BASED_OUTPATIENT_CLINIC_OR_DEPARTMENT_OTHER)
Admission: EM | Admit: 2018-12-03 | Discharge: 2018-12-03 | Disposition: A | Discharge status: Home / Self Care | Payer: Medicare HMO | Attending: Emergency Medicine | Admitting: Emergency Medicine

## 2018-12-03 ENCOUNTER — Other Ambulatory Visit: Payer: Self-pay

## 2018-12-03 ENCOUNTER — Encounter (HOSPITAL_BASED_OUTPATIENT_CLINIC_OR_DEPARTMENT_OTHER): Payer: Self-pay | Admitting: Emergency Medicine

## 2018-12-03 ENCOUNTER — Encounter: Payer: Self-pay | Admitting: Family Medicine

## 2018-12-03 VITALS — BP 148/90 | HR 86 | Wt 225.0 lb

## 2018-12-03 DIAGNOSIS — Z79899 Other long term (current) drug therapy: Secondary | ICD-10-CM | POA: Insufficient documentation

## 2018-12-03 DIAGNOSIS — R519 Headache, unspecified: Secondary | ICD-10-CM

## 2018-12-03 DIAGNOSIS — R1013 Epigastric pain: Secondary | ICD-10-CM | POA: Insufficient documentation

## 2018-12-03 DIAGNOSIS — Z87891 Personal history of nicotine dependence: Secondary | ICD-10-CM | POA: Insufficient documentation

## 2018-12-03 DIAGNOSIS — R109 Unspecified abdominal pain: Secondary | ICD-10-CM | POA: Diagnosis present

## 2018-12-03 DIAGNOSIS — I10 Essential (primary) hypertension: Secondary | ICD-10-CM | POA: Insufficient documentation

## 2018-12-03 DIAGNOSIS — K219 Gastro-esophageal reflux disease without esophagitis: Secondary | ICD-10-CM | POA: Diagnosis not present

## 2018-12-03 DIAGNOSIS — J45909 Unspecified asthma, uncomplicated: Secondary | ICD-10-CM | POA: Insufficient documentation

## 2018-12-03 DIAGNOSIS — M25511 Pain in right shoulder: Secondary | ICD-10-CM

## 2018-12-03 DIAGNOSIS — R51 Headache: Secondary | ICD-10-CM | POA: Insufficient documentation

## 2018-12-03 DIAGNOSIS — R232 Flushing: Secondary | ICD-10-CM

## 2018-12-03 LAB — COMPREHENSIVE METABOLIC PANEL
ALT: 38 U/L (ref 0–44)
AST: 20 U/L (ref 15–41)
Albumin: 3.6 g/dL (ref 3.5–5.0)
Alkaline Phosphatase: 63 U/L (ref 38–126)
Anion gap: 6 (ref 5–15)
BUN: 16 mg/dL (ref 6–20)
CO2: 25 mmol/L (ref 22–32)
Calcium: 8.6 mg/dL — ABNORMAL LOW (ref 8.9–10.3)
Chloride: 107 mmol/L (ref 98–111)
Creatinine, Ser: 0.57 mg/dL (ref 0.44–1.00)
GFR calc Af Amer: >60 mL/min (ref >60)
GFR calc non Af Amer: >60 mL/min (ref >60)
Glucose, Bld: 97 mg/dL (ref 70–99)
Potassium: 3.7 mmol/L (ref 3.5–5.1)
Sodium: 138 mmol/L (ref 135–145)
Total Bilirubin: 0.3 mg/dL (ref 0.3–1.2)
Total Protein: 6.4 g/dL — ABNORMAL LOW (ref 6.5–8.1)

## 2018-12-03 LAB — CBC WITH DIFFERENTIAL/PLATELET
Abs Immature Granulocytes: 0.12 10*3/uL — ABNORMAL HIGH (ref 0.00–0.07)
Basophils Absolute: 0 10*3/uL (ref 0.0–0.1)
Basophils Relative: 0 %
Eosinophils Absolute: 0.1 10*3/uL (ref 0.0–0.5)
Eosinophils Relative: 1 %
HCT: 46 % (ref 36.0–46.0)
Hemoglobin: 14.6 g/dL (ref 12.0–15.0)
Immature Granulocytes: 1 %
Lymphocytes Relative: 20 %
Lymphs Abs: 2 10*3/uL (ref 0.7–4.0)
MCH: 29 pg (ref 26.0–34.0)
MCHC: 31.7 g/dL (ref 30.0–36.0)
MCV: 91.3 fL (ref 80.0–100.0)
Monocytes Absolute: 0.9 10*3/uL (ref 0.1–1.0)
Monocytes Relative: 8 %
Neutro Abs: 7.1 10*3/uL (ref 1.7–7.7)
Neutrophils Relative %: 70 %
Platelets: 226 10*3/uL (ref 150–400)
RBC: 5.04 MIL/uL (ref 3.87–5.11)
RDW: 13.6 % (ref 11.5–15.5)
WBC: 10.2 10*3/uL (ref 4.0–10.5)
nRBC: 0 % (ref 0.0–0.2)

## 2018-12-03 LAB — LIPASE, BLOOD: Lipase: 73 U/L — ABNORMAL HIGH (ref 11–51)

## 2018-12-03 MED ORDER — DICYCLOMINE HCL 10 MG PO CAPS
10.0000 mg | ORAL_CAPSULE | Freq: Once | ORAL | Status: AC
  Administered 2018-12-03: 10 mg via ORAL
  Filled 2018-12-03: qty 1

## 2018-12-03 MED ORDER — DICYCLOMINE HCL 20 MG PO TABS: 20.0000 mg | ORAL_TABLET | Freq: Four times a day (QID) | ORAL | 0 refills | Status: DC | PRN

## 2018-12-03 MED ORDER — DROPERIDOL 2.5 MG/ML IJ SOLN
INTRAMUSCULAR | Status: AC
  Filled 2018-12-03: qty 2

## 2018-12-03 MED ORDER — DROPERIDOL 2.5 MG/ML IJ SOLN: 2.5000 mg | Freq: Once | INTRAMUSCULAR | Status: DC

## 2018-12-03 NOTE — ED Notes (Signed)
Pt c/o chronic headache as well as chronic abdominal pain. She refused the droperidol d/t her various "allergies." Had evisit with PCP this morning and was told a few causes for her headaches, but she does not believe that dehydration or BP could be the cause.

## 2018-12-03 NOTE — Progress Notes (Signed)
Pt stated that she has been experiencing "major" headaches. They are located at the top of her head but not at the front, towards the front. She said it gotten worse. She has been taking tylenol this has not helped. She has not noticed any visual changes.   She also mentioned that every time she eats the stomach pain gets worse no matter what she eats, even when she takes the Carefate "it's like a bandaid when I take this"  She talked about having mid back pain she said that it also correlates with her headaches.    She would like a refill of the Ranitidin syrup I tried to reorder it would not allow me to.Jennifer [name], [place] Crocker, CMA
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
## 2018-12-03 NOTE — ED Notes (Signed)
Pt verbalizes understanding of d/c instructions.

## 2018-12-03 NOTE — ED Triage Notes (Addendum)
Epigastric pain for a couple weeks with nausea.  Also, "massive headache".

## 2018-12-03 NOTE — ED Notes (Signed)
Pt up to restroom without difficulty.  

## 2018-12-03 NOTE — ED Notes (Signed)
EDP aware that pt does not want droperidol

## 2018-12-04 ENCOUNTER — Telehealth: Payer: Self-pay | Admitting: Family Medicine

## 2018-12-04 ENCOUNTER — Telehealth (HOSPITAL_BASED_OUTPATIENT_CLINIC_OR_DEPARTMENT_OTHER): Payer: Self-pay | Admitting: Emergency Medicine

## 2018-12-04 MED ORDER — FAMOTIDINE 40 MG PO TABS: 40.0000 mg | ORAL_TABLET | Freq: Every day | ORAL | 2 refills | Status: DC

## 2018-12-04 NOTE — Addendum Note (Signed)
Addended by: Beatrice Lecher D on: 12/04/2018 07:26 AM   Modules accepted: Orders

## 2018-12-04 NOTE — Telephone Encounter (Signed)
Pt went to ED after visit yesterday. She had labs done that were abnormal. She said they told her her "labs were fine." She wants answers as to why she doesn't feel well and why they are out of range. Routing.

## 2018-12-04 NOTE — Telephone Encounter (Signed)
Update- Pt called clinic and request to do another DOXIMITY visit with PCP. Scheduled for tomorrow AM before her follow up with Dr Georgina Snell

## 2018-12-05 ENCOUNTER — Encounter: Payer: Self-pay | Admitting: Osteopathic Medicine

## 2018-12-05 ENCOUNTER — Encounter: Payer: Medicare HMO | Admitting: Family Medicine

## 2018-12-05 ENCOUNTER — Telehealth: Payer: Medicare HMO | Admitting: Family Medicine

## 2018-12-05 ENCOUNTER — Ambulatory Visit (INDEPENDENT_AMBULATORY_CARE_PROVIDER_SITE_OTHER): Payer: Medicare HMO | Admitting: Family Medicine

## 2018-12-05 ENCOUNTER — Encounter: Payer: Self-pay | Admitting: Family Medicine

## 2018-12-05 ENCOUNTER — Ambulatory Visit (INDEPENDENT_AMBULATORY_CARE_PROVIDER_SITE_OTHER): Payer: Medicare HMO | Admitting: Osteopathic Medicine

## 2018-12-05 VITALS — BP 130/87

## 2018-12-05 VITALS — BP 131/84 | HR 85 | Temp 98.1°F | Wt 224.1 lb

## 2018-12-05 DIAGNOSIS — R1013 Epigastric pain: Secondary | ICD-10-CM | POA: Diagnosis not present

## 2018-12-05 DIAGNOSIS — R21 Rash and other nonspecific skin eruption: Secondary | ICD-10-CM

## 2018-12-05 DIAGNOSIS — G8929 Other chronic pain: Secondary | ICD-10-CM

## 2018-12-05 DIAGNOSIS — R238 Other skin changes: Secondary | ICD-10-CM

## 2018-12-05 DIAGNOSIS — R519 Headache, unspecified: Secondary | ICD-10-CM

## 2018-12-05 DIAGNOSIS — R51 Headache: Secondary | ICD-10-CM

## 2018-12-05 DIAGNOSIS — R748 Abnormal levels of other serum enzymes: Secondary | ICD-10-CM

## 2018-12-05 LAB — HEPATIC FUNCTION PANEL
ALT: 28 (ref 7–35)
AST: 12 — AB (ref 13–35)
Alkaline Phosphatase: 70 (ref 25–125)
Bilirubin, Total: 0.3

## 2018-12-05 LAB — BASIC METABOLIC PANEL
BUN: 23 — AB (ref 4–21)
Creatinine: 0.7 (ref 0.5–1.1)
Glucose: 78
Potassium: 4.2 (ref 3.4–5.3)
Sodium: 142 (ref 137–147)

## 2018-12-05 LAB — IRON,TIBC AND FERRITIN PANEL
%SAT: 11
Ferritin: 132.9
Iron: 36
TIBC: 339

## 2018-12-05 MED ORDER — CLOBETASOL PROPIONATE 0.05 % EX FOAM: Freq: Two times a day (BID) | CUTANEOUS | 0 refills | Status: DC

## 2018-12-05 MED ORDER — VALACYCLOVIR HCL 1 G PO TABS: 1000.0000 mg | ORAL_TABLET | Freq: Three times a day (TID) | ORAL | 0 refills | Status: DC

## 2018-12-05 MED ORDER — CHLORHEXIDINE GLUCONATE 4 % EX LIQD: Freq: Every day | CUTANEOUS | 0 refills | Status: DC

## 2018-12-05 NOTE — Progress Notes (Signed)
Virtual Visit via Video Note  I connected with Jennifer [name] on 12/05/18 at  8:30 AM EDT by a video enabled telemedicine application and verified that I am speaking with the correct person using two identifiers.   I discussed the limitations of evaluation and management by telemedicine and the availability of in person appointments. The patient expressed understanding and agreed to proceed.  Subjective:    CC: ED visit F/U   HPI:  53 yo f/U for ED visit for abdominal pain on 12/03/2018.  She went to the ED c/o of worsening abdominal pain and Headaches.  See note from that day. I had already seen her on virtual visit for similar symptoms. She had recenlty run out of her ranitidine and was taking her PPI just a fw times a week as she can't take it dialy or she gets side effects. She was taking her carafate daily.  She did have labs and CBC was normal. Lipase was mildly elevated at 73.  She was also seen for constant Headaches.  She noticed blisters on her of scalp where she has been hurting.  More on the right posterior scalp but has felt a few on the left side as well.  Her husband says the bumps looked red. She feels like it is "burning".  She does report that she changed shampoos about 2 weeks ago so does not know why she would be having a reaction now.   Checked her sugar and it was 202 about 2 hours after she eats this morning.  She says she just feels horrible today she feels "sick".  She has a bad headache.  Her scalp is itching as above.  Her stomach hurts and she feels nauseated.  She was able to eat an egg and some toast for breakfast and keep it down she is not had any vomiting.  I think she is just feeling particularly stressed and overwhelmed and says she really wants to go back to the emergency department.  She just thinks something is wrong with her.  She is very concerned about the elevated lipase that was seen on her labs that was done in the emergency department 2 days ago.  She was  also concerned about the elevation in absolute immature granulocyte count as well.  Though she has not had any fevers or chills.   Past medical history, Surgical history, Family history not pertinant except as noted below, Social history, Allergies, and medications have been entered into the medical record, reviewed, and corrections made.   Review of Systems: No fevers, chills, night sweats, weight loss, chest pain, or shortness of breath.   Objective:    General: Speaking clearly in complete sentences without any shortness of breath.  Alert and oriented x3.  Normal judgment. No apparent acute distress.  Well-groomed.  Patient walking around the house and then sitting while talking.  She did try to show me the rash over the video chat but I was unable to get a good view.  I could not tell if the lesions were more vesicular versus pustular.    Impression and Recommendations:    Rash - consider shingles.  Had pain initially and now has some bumps on her scalp.  Dsicussed optoins. We could start her on an anti-viral presumptively for shinges or can come back into the ofice tomorrow. She wants to be seen. Will schedule fo 8:30 AM. Patient changed her mind and felt she really wanted to go to the ED so I  offered to get her in with one of my partners and strongly encouraged her not to go to the ED. also recommend using ice packs to soothe the burning sensation as well as applying a topical lidocaine gel or cream.  Headaches - has been lingering. Not really improving. Has been trying to drink some water.  I think stress related. She doesn't feel ike she has any sinusitis sxs right now.  I would actually recommend something such as low-dose amitriptyline at bedtime for chronic daily headache but I know she will take the medication.  Epigastric pain - Hasn't had a chance to pick up Pepcid yet. Husband will get paid today so she thinks she will picki tup today.  Needs to restart med ot get relief.  She  really has to be proactive in helping herself so she is going to have to start the medication and she really needs to continue to work on eating a bland diet.  Elevated lipase - plan to recheck before the weekend to make sure going down.      I discussed the assessment and treatment plan with the patient. The patient was provided an opportunity to ask questions and all were answered. The patient agreed with the plan and demonstrated an understanding of the instructions.   The patient was advised to call back or seek an in-person evaluation if the symptoms worsen or if the condition fails to improve as anticipated.   Beatrice Lecher, MD

## 2018-12-05 NOTE — Progress Notes (Signed)
HPI: Jennifer [name] is a 53 y.o. female who  has a past medical history of Allergy, Anemia, Asthma, Atrial tachycardia ([place]) (03-2008), Chronic headaches, Claustrophobia, Complication of anesthesia, Cough, GERD (gastroesophageal reflux disease) ( 6/09, ), Hyperaldosteronism, Hyperlipidemia, Hypertension, LBP (low back pain) (02/2004), MRSA (methicillin resistant staph aureus) culture positive, Multiple sclerosis ([place]), Neck pain (12/2005), Paget's disease of vulva, Personality disorder ([place]), PTSD (post-traumatic stress disorder), PVC (premature ventricular contraction), Seizures ([place]), Shoulder pain, Sleep apnea (2009), Sleep apnea (March 02, 2014 ), Spasticity, Uterine cancer ([place]), Vitamin D deficiency, and Vocal cord dysfunction.  she presents to [facility] today, 12/05/18,  for chief complaint of:  Concern for shingles   Per note w/ Dr Madilyn Fireman earlier today: "She noticed blisters on her of scalp where she has been hurting.  More on the right posterior scalp but has felt a few on the left side as well.  Her husband says the bumps looked red. She feels like it is "burning"."  Pt confirms the above information, reports burning sensation in her scalp, mild headache, she is concerned about red bumps.   Labs repeated from ER, ordered by Dr Suzi Roots, currently pending results.     At today's visit 12/05/18 ... PMH, PSH, FH reviewed and updated as needed.  Current medication list and allergy/intolerance hx reviewed and updated as needed. (See remainder of HPI, ROS, Phys Exam below)   No results found.  Results for orders placed or performed during the hospital encounter of 12/03/18 (from the past 72 hour(s))  Comprehensive metabolic panel     Status: Abnormal   Collection Time: 12/03/18  4:35 PM  Result Value Ref Range   Sodium 138 135 - 145 mmol/L   Potassium 3.7 3.5 - 5.1 mmol/L   Chloride 107 98 - 111 mmol/L   CO2 25 22 - 32 mmol/L   Glucose, Bld 97  70 - 99 mg/dL   BUN 16 6 - 20 mg/dL   Creatinine, Ser 0.57 0.44 - 1.00 mg/dL   Calcium 8.6 (L) 8.9 - 10.3 mg/dL   Total Protein 6.4 (L) 6.5 - 8.1 g/dL   Albumin 3.6 3.5 - 5.0 g/dL   AST 20 15 - 41 U/L   ALT 38 0 - 44 U/L   Alkaline Phosphatase 63 38 - 126 U/L   Total Bilirubin 0.3 0.3 - 1.2 mg/dL   GFR calc non Af Amer >60 >60 mL/min   GFR calc Af Amer >60 >60 mL/min   Anion gap 6 5 - 15    Comment: Performed at [facility], [address] [place], [place] [address]  Lipase, blood     Status: Abnormal   Collection Time: 12/03/18  4:35 PM  Result Value Ref Range   Lipase 73 (H) 11 - 51 U/L    Comment: Performed at [facility], [address] [place], [place] [address]  CBC with Differential     Status: Abnormal   Collection Time: 12/03/18  4:35 PM  Result Value Ref Range   WBC 10.2 4.0 - 10.5 K/uL   RBC 5.04 3.87 - 5.11 MIL/uL   Hemoglobin 14.6 12.0 - 15.0 g/dL   HCT 46.0 36.0 - 46.0 %   MCV 91.3 80.0 - 100.0 fL   MCH 29.0 26.0 - 34.0 pg   MCHC 31.7 30.0 - 36.0 g/dL   RDW 13.6 11.5 - 15.5 %   Platelets 226 150 - 400 K/uL   nRBC  0.0 0.0 - 0.2 %   Neutrophils Relative % 70 %   Neutro Abs 7.1 1.7 - 7.7 K/uL   Lymphocytes Relative 20 %   Lymphs Abs 2.0 0.7 - 4.0 K/uL   Monocytes Relative 8 %   Monocytes Absolute 0.9 0.1 - 1.0 K/uL   Eosinophils Relative 1 %   Eosinophils Absolute 0.1 0.0 - 0.5 K/uL   Basophils Relative 0 %   Basophils Absolute 0.0 0.0 - 0.1 K/uL   Immature Granulocytes 1 %   Abs Immature Granulocytes 0.12 (H) 0.00 - 0.07 K/uL    Comment: Performed at [facility], [address] [place], [place] [address]   *Note: Due to a large number of results and/or encounters for the requested time period, some results have not been displayed. A complete set of results can be found in Results Review.          ASSESSMENT/PLAN: The encounter diagnosis was Scalp irritation.    Meds ordered this encounter  Medications  .  DISCONTD: valACYclovir (VALTREX) 1000 MG tablet    Sig: Take 1 tablet (1,000 mg total) by mouth 3 (three) times daily.    Dispense:  21 tablet    Refill:  0  . clobetasol (OLUX) 0.05 % topical foam    Sig: Apply topically 2 (two) times daily. To area of irritation on the scalp.    Dispense:  50 g    Refill:  0  . chlorhexidine (HIBICLENS) 4 % external liquid    Sig: Apply topically daily. In the shower for 5 days    Dispense:  118 mL    Refill:  0    Patient Instructions  I'm not sure what the exact diagnosis might be - this looks like possibly shingles but possibly irritation due to folliculitis which is infection or irritation of the oil glands around the hair follicles. I think let's try the following and see if it helps:  Antiviral (valacyclovir) for possible shingles  Steroid (Clobetasol foam) applied to the irritated areas to reduce pain/irritation  Antibacterial (hibiclens) shampoo to the scalp daily for 5 days   If these measures aren't helping, or something changes or gets worse, please let me or Dr Madilyn Fireman know!      Follow-up plan: Return if symptoms worsen or fail to improve.                                                 ################################################# ################################################# ################################################# #################################################    No outpatient medications have been marked as taking for the 12/05/18 encounter (Appointment) with Emeterio Reeve, DO.    Allergies  Allergen Reactions  . Azithromycin Shortness Of Breath    Lip swelling, SOB.     . Ciprofloxacin Swelling    REACTION: tongue swells  . Codeine Shortness Of Breath  . Milk-Related Compounds Swelling    Throat feels tight  . Mushroom Extract Complex Other (See Comments) and Anaphylaxis    Didn't feel right Per allergist do not take  . Sulfa Antibiotics  Shortness Of Breath, Rash and Other (See Comments)  . Telmisartan Swelling    Tongue swelling, Micardis  . Ace Inhibitors Cough  . Aspirin Hives and Other (See Comments)    flushing  . Avelox [Moxifloxacin Hcl In Nacl] Itching       . Beta Adrenergic Blockers  Other (See Comments)    Feels like chest tightening labetalol, bystolic  Feels like chest tightening "Metoprolol"   . Buspar [Buspirone] Other (See Comments)    Light headed  . Butorphanol Tartrate Other (See Comments)    Patient aggitated  . Cetirizine Hives and Rash       . Clonidine Hcl     REACTION: makes blood pressure high  . Cortisone     Feels like she is going crazy  . Erythromycin Rash  . Fentanyl Other (See Comments)    aggressive   . Fluoxetine Hcl Other (See Comments)    REACTION: headaches  . Ketorolac Tromethamine     jittery  . Lidocaine Other (See Comments)    When it involves the throat,   . Lisinopril Cough  . Metoclopramide Hcl Other (See Comments)    Dystonic reaction  . Montelukast Other (See Comments)    Singulair  . Montelukast Sodium Other (See Comments)    DOES NOT REMEMBER   . Naproxen Other (See Comments)    FLUSHING  . Paroxetine Other (See Comments)    REACTION: headaches  . Penicillins Rash  . Pravastatin Other (See Comments)    Myalgias  . Promethazine Other (See Comments)    Dystonic reaction  . Promethazine Hcl Other (See Comments)    jittery  . Quinolones Swelling and Rash  . Serotonin Reuptake Inhibitors (Ssris) Other (See Comments)    Headache Effexor, prozac, zoloft,   . Sertraline Hcl     REACTION: headaches  . Stelazine [Trifluoperazine] Other (See Comments)    Dystonic reaction  . Tobramycin Itching and Rash  . Trifluoperazine Hcl     dystonic  . Versed [Midazolam]     agitation  . Whey     Milk allergy  . Propoxyphene   . Adhesive [Tape] Rash    EKG monitor patches, some tapes Blisters, rash, itching, welts.  . Butorphanol Anxiety    Patient agitated   . Ceftriaxone Rash    rocephin  . Erythromycin Base Itching and Rash  . Iron Rash    Flushing with certain IV types  . Metoclopramide Itching and Other (See Comments)    Dystonic reaction  . Metronidazole Rash  . Other Rash and Other (See Comments)    Uncoded Allergy. Allergen: steriods, Other Reaction: Not Assessed Other reaction(s): Flushing (ALLERGY/intolerance), GI Upset (intolerance), Hypertension (intolerance), Increased Heart Rate (intolerance), Mental Status Changes (intolerance), Other (See Comments), Tachycardia / Palpitations(intolerance) Hospital gowns leave a rash.   . Prednisone Anxiety and Palpitations  . Prochlorperazine Anxiety    Compazine:  Dystonic reaction  . Venlafaxine Anxiety  . Zyrtec [Cetirizine Hcl] Rash    All over body       Review of Systems:  Constitutional: No recent illness  HEENT: +headache, no vision change  Cardiac: No  chest pain, No  pressure, No palpitations  Respiratory:  No  shortness of breath. No  Cough  Musculoskeletal: No new myalgia/arthralgia  Skin: +Rash  Neurologic: No  weakness, No  Dizziness   Exam:  BP 131/84 (BP Location: Left Arm, Patient Position: Sitting, Cuff Size: Large)   Pulse 85   Temp 98.1 F (36.7 C) (Oral)   Wt 224 lb 1.6 oz (101.7 kg)   LMP 06/25/2013   BMI 40.99 kg/m   Constitutional: VS see above. General Appearance: alert, well-developed, well-nourished, NAD  Eyes: Normal lids and conjunctive, non-icteric sclera  Ears, Nose, Mouth, Throat: MMM, Normal external inspection ears/nares/mouth/lips/gums.  Neck: No masses,  trachea midline.   Respiratory: Normal respiratory effort. no wheeze, no rhonchi, no rales  Cardiovascular: S1/S2 normal, no murmur, no rub/gallop auscultated. RRR.   Musculoskeletal: Gait normal. Symmetric and independent movement of all extremities  Neurological: Normal balance/coordination. No tremor.  Skin: warm, dry, intact. Scattered 64mm pimple-like lesions on  scalp near base of neck, bilaterally, no ulceration or anything that looks like true blisters,  Psychiatric: Normal judgment/insight. Normal mood and affect. Oriented x3.       Visit summary with medication list and pertinent instructions was printed for patient to review, patient was advised to alert [place] if any updates are needed. All questions at time of visit were answered - patient instructed to contact office with any additional concerns. ER/RTC precautions were reviewed with the patient and understanding verbalized.     Please note: voice recognition software was used to produce this document, and typos may escape review. Please contact Dr. Sheppard Coil for any needed clarifications.    Follow up plan: Return if symptoms worsen or fail to improve.

## 2018-12-05 NOTE — Progress Notes (Signed)
Error

## 2018-12-05 NOTE — Patient Instructions (Signed)
I'm not sure what the exact diagnosis might be - this looks like possibly shingles but possibly irritation due to folliculitis which is infection or irritation of the oil glands around the hair follicles. I think let's try the following and see if it helps:  Antiviral (valacyclovir) for possible shingles  Steroid (Clobetasol foam) applied to the irritated areas to reduce pain/irritation  Antibacterial (hibiclens) shampoo to the scalp daily for 5 days   If these measures aren't helping, or something changes or gets worse, please let me or Dr Madilyn Fireman know!

## 2018-12-05 NOTE — Progress Notes (Signed)
She reports that she has some blisters in the back of her head that popped up yesterday, they are painful. She shampooed her hair to see if this would help and this calmed the burning down. She also washed her bed linens, pillows,ect.(her husband had put some of his clothes in with her things and it possibly could have had fiberglass on it).  She did mention that she used a different shampoo that she hasn't used in a few weeks. She has used this shampoo before but never had this reaction.  Stomach and head still bothering her. Her headache won't go away.  She said that she is feeling really shaky and feels that she should go back to the ED.

## 2018-12-06 ENCOUNTER — Encounter: Payer: Self-pay | Admitting: Family Medicine

## 2018-12-06 ENCOUNTER — Telehealth: Payer: Self-pay | Admitting: Family Medicine

## 2018-12-06 MED ORDER — ACYCLOVIR 400 MG PO TABS: 800.0000 mg | ORAL_TABLET | Freq: Every day | ORAL | 0 refills | Status: AC

## 2018-12-06 NOTE — Telephone Encounter (Signed)
Call patient: Iron levels are a little on the low end.  Her value was 36 and normal is [id number].  Encouraged her to try to eat more foods that are rich in iron and consider starting an over-the-counter iron supplement.  They can be constipating so she would need to take a stool softener with it.  Her lipase level was 74 which is about what it was in the emergency department so I do want to check this again in 1 week it is at least stable its not trending upward which is actually very reassuring.  Please see if she was able to pick up her Pepcid.  All the other electrolytes including liver function looked great.

## 2018-12-09 ENCOUNTER — Ambulatory Visit (INDEPENDENT_AMBULATORY_CARE_PROVIDER_SITE_OTHER): Payer: Medicare HMO | Admitting: Family Medicine

## 2018-12-09 ENCOUNTER — Encounter: Payer: Self-pay | Admitting: Family Medicine

## 2018-12-09 VITALS — BP 138/69 | HR 86 | Ht 62.0 in | Wt 224.0 lb

## 2018-12-09 DIAGNOSIS — R51 Headache: Secondary | ICD-10-CM | POA: Diagnosis not present

## 2018-12-09 DIAGNOSIS — R21 Rash and other nonspecific skin eruption: Secondary | ICD-10-CM

## 2018-12-09 DIAGNOSIS — R1013 Epigastric pain: Secondary | ICD-10-CM | POA: Diagnosis not present

## 2018-12-09 DIAGNOSIS — R7303 Prediabetes: Secondary | ICD-10-CM

## 2018-12-09 DIAGNOSIS — E876 Hypokalemia: Secondary | ICD-10-CM | POA: Diagnosis not present

## 2018-12-09 DIAGNOSIS — R519 Headache, unspecified: Secondary | ICD-10-CM

## 2018-12-09 LAB — POCT GLYCOSYLATED HEMOGLOBIN (HGB A1C): Hemoglobin A1C: 6.2 % — AB (ref 4.0–5.6)
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
## 2018-12-09 NOTE — Telephone Encounter (Signed)
Pt advised at [facility] today.Jennifer [name], Jennifer [name], CMA

## 2018-12-10 ENCOUNTER — Encounter: Payer: Self-pay | Admitting: Family Medicine

## 2018-12-10 ENCOUNTER — Ambulatory Visit (INDEPENDENT_AMBULATORY_CARE_PROVIDER_SITE_OTHER): Payer: Medicare HMO | Admitting: Family Medicine

## 2018-12-10 ENCOUNTER — Telehealth: Payer: Self-pay | Admitting: *Deleted

## 2018-12-10 ENCOUNTER — Encounter: Payer: Self-pay | Admitting: *Deleted

## 2018-12-10 VITALS — Ht 62.0 in | Wt 224.0 lb

## 2018-12-10 DIAGNOSIS — M25511 Pain in right shoulder: Secondary | ICD-10-CM | POA: Diagnosis not present

## 2018-12-10 LAB — LAB REPORT - SCANNED
Carbon Dioxide, Total: 25
Chloride: 109
Lipase: 74
Potassium: 4.2
Transferrin: 242
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
## 2018-12-10 NOTE — Telephone Encounter (Signed)
Spoke w/pt regarding her most recent lab results. She was concerned about her iron levels because of the spike in them. She was concerned about how her iron could have gone up so quickly in just a few days? She denied taking any type of supplementation or even eating anything that could have caused such a dramatic change in her results. She has to f/u with the hematologist and they have to have her iron levels prior to her receiving her infusions. And this also concerns her about her lipase labs whether or not they were actually accurate.  I told her that I would fwd her concerns to Dr. Madilyn Fireman and let her know.Marland KitchenMarland KitchenElouise Munroe, [place]

## 2018-12-10 NOTE — Telephone Encounter (Signed)
Yes, I think lipase was accurate. In regard to iron will have to address with hematology

## 2018-12-10 NOTE — Patient Instructions (Signed)
Thank you for coming in today. Please keep working on range of motion.  Contact your surgeon's office.  Let me know if you need anything,   The goal is to try to get you to surgery.  Make sure your surgeon knows that you are very symptomatic.  Injection was April 16th.

## 2018-12-10 NOTE — Telephone Encounter (Signed)
Sent pt my chart message advising her of this.Jennifer [name], Lahoma Crocker, CMA

## 2018-12-11 DIAGNOSIS — R110 Nausea: Secondary | ICD-10-CM | POA: Diagnosis not present

## 2018-12-11 DIAGNOSIS — R1013 Epigastric pain: Secondary | ICD-10-CM | POA: Diagnosis not present

## 2018-12-13 DIAGNOSIS — G35 Multiple sclerosis: Secondary | ICD-10-CM | POA: Diagnosis not present

## 2018-12-13 DIAGNOSIS — E2609 Other primary hyperaldosteronism: Secondary | ICD-10-CM | POA: Diagnosis not present

## 2018-12-13 DIAGNOSIS — I10 Essential (primary) hypertension: Secondary | ICD-10-CM | POA: Diagnosis not present

## 2018-12-13 DIAGNOSIS — Z884 Allergy status to anesthetic agent status: Secondary | ICD-10-CM | POA: Diagnosis not present

## 2018-12-13 DIAGNOSIS — R1084 Generalized abdominal pain: Secondary | ICD-10-CM | POA: Diagnosis not present

## 2018-12-13 DIAGNOSIS — Z881 Allergy status to other antibiotic agents status: Secondary | ICD-10-CM | POA: Diagnosis not present

## 2018-12-13 DIAGNOSIS — Z886 Allergy status to analgesic agent status: Secondary | ICD-10-CM | POA: Diagnosis not present

## 2018-12-13 DIAGNOSIS — Z87891 Personal history of nicotine dependence: Secondary | ICD-10-CM | POA: Diagnosis not present

## 2018-12-13 DIAGNOSIS — E119 Type 2 diabetes mellitus without complications: Secondary | ICD-10-CM | POA: Diagnosis not present

## 2018-12-13 DIAGNOSIS — E785 Hyperlipidemia, unspecified: Secondary | ICD-10-CM | POA: Diagnosis not present

## 2018-12-16 DIAGNOSIS — K2930 Chronic superficial gastritis without bleeding: Secondary | ICD-10-CM | POA: Diagnosis not present

## 2018-12-16 DIAGNOSIS — R1013 Epigastric pain: Secondary | ICD-10-CM | POA: Diagnosis not present

## 2018-12-16 DIAGNOSIS — Z9049 Acquired absence of other specified parts of digestive tract: Secondary | ICD-10-CM | POA: Diagnosis not present

## 2018-12-16 DIAGNOSIS — R194 Change in bowel habit: Secondary | ICD-10-CM | POA: Diagnosis not present

## 2018-12-16 DIAGNOSIS — Z8719 Personal history of other diseases of the digestive system: Secondary | ICD-10-CM | POA: Diagnosis not present

## 2018-12-16 DIAGNOSIS — R1011 Right upper quadrant pain: Secondary | ICD-10-CM | POA: Diagnosis not present

## 2018-12-16 DIAGNOSIS — K2950 Unspecified chronic gastritis without bleeding: Secondary | ICD-10-CM | POA: Diagnosis not present

## 2018-12-16 DIAGNOSIS — E1165 Type 2 diabetes mellitus with hyperglycemia: Secondary | ICD-10-CM | POA: Diagnosis not present

## 2018-12-16 DIAGNOSIS — R1310 Dysphagia, unspecified: Secondary | ICD-10-CM | POA: Diagnosis not present

## 2018-12-16 DIAGNOSIS — K220 Achalasia of cardia: Secondary | ICD-10-CM | POA: Diagnosis not present

## 2018-12-17 ENCOUNTER — Telehealth (INDEPENDENT_AMBULATORY_CARE_PROVIDER_SITE_OTHER): Payer: Medicare HMO | Admitting: Family Medicine

## 2018-12-17 ENCOUNTER — Encounter: Payer: Self-pay | Admitting: Family Medicine

## 2018-12-17 VITALS — BP 137/89 | HR 71 | Temp 97.0°F | Ht 62.0 in | Wt 224.0 lb

## 2018-12-17 DIAGNOSIS — R079 Chest pain, unspecified: Secondary | ICD-10-CM | POA: Diagnosis not present

## 2018-12-17 DIAGNOSIS — R110 Nausea: Secondary | ICD-10-CM | POA: Diagnosis not present

## 2018-12-17 DIAGNOSIS — R072 Precordial pain: Secondary | ICD-10-CM | POA: Diagnosis not present

## 2018-12-17 DIAGNOSIS — M542 Cervicalgia: Secondary | ICD-10-CM

## 2018-12-17 DIAGNOSIS — R1013 Epigastric pain: Secondary | ICD-10-CM

## 2018-12-17 NOTE — Progress Notes (Signed)
Pt reports that after her scope she was feeling fine. Later that evening she began experiencing pain. She woke up w/severe neck pain and chest discomfort.  She's unsure if this is related to her BP.  She went to the ED she told them that the outside of her throat was bothering her not the inside. She kept telling them that it was her neck.  I asked if she was able to turn her head from L to R she said it feels heavy wen she turns to the R and when she turns to the L its really tender. She has been using warm compresses for this. Denies any problems with swallowing, just neck pain. She took some tylenol around 1pm.   She continues to have nausea and stomach pain. She questions if there is a possibility of a stone being in the area where her gallbladder was?   Maryruth Eve, Lahoma Crocker, CMA

## 2018-12-17 NOTE — Progress Notes (Signed)
Virtual Visit via Video Note  I connected with Jennifer [name] on 12/17/18 at  4:00 PM EDT by a video enabled telemedicine application and verified that I am speaking with the correct person using two identifiers.   I discussed the limitations of evaluation and management by telemedicine and the availability of in person appointments. The patient expressed understanding and agreed to proceed.  Subjective:    CC: Neck Pain  HPI:  She went to the ED earlier  Today for left sided chest and neck pain that was radiating down in t oher left arm.  She had an endoscopy yesterday monring.  Her BNP and troponin were normal. Normal xray to rule out esoph injury.  She denies ST.  Pt reports that after her scope she was feeling fine. Later that evening she began experiencing left neck pain. She woke up w/severe neck pain and chest discomfort. Did have some Zoran yesterday. She's unsure if this is related to her BP.  Took tylenol around 1 pm and didn't help.  She reports that her BP was high again in the emergency department.  Blood pressure was high again in the emergency department.  Neck pain - She has been using warm compresses for this. Has been having pain in her left upper chest wall. It feels sore.  Yesterday had some blurry vision when she was looking in the mirror. Woke up sweaty this morning.  She denies any known injury or trauma to her neck or chest.  Got a good report on the endoscopy. She is still having some nausea and abdominal pain. Awaiting bs report.  Feeling very frustrated that she does not have a cause for her pain and nausea.   Constipation -she has a history of chronic constipation and says she was starting to feel backed up again over the weekend with increasing abdominal pain.  She used some mag citrate on Sat and that helped her move her bowels.  Past medical history, Surgical history, Family history not pertinant except as noted below, Social history, Allergies, and medications  have been entered into the medical record, reviewed, and corrections made.   Review of Systems: No fevers, chills, night sweats, weight loss, chest pain, or shortness of breath.   Objective:    General: Speaking clearly in complete sentences without any shortness of breath.  Alert and oriented x3.  Normal judgment. No apparent acute distress. Lying in bed tearful.  She is able to flex her head forward extend and rotate right and left.She is able to flex her head forward, extend and rotate left and right.     Impression and Recommendations:    Left sided atypical chest pain - gave her reassurance.  Reminded her that EKG and tropnin that is normal ant that she is not having a heart attack. I real;y think this is musculoskeletal.  Strongly encouraged her to take something for pain.  I expressed that I do not think that it is helpful if she is just lying there and pain when she has medication at home that she could take that might actually help provide some relief for her.  Again just reassured her that I do not think it is cardiac I really think is more musculoskeletal and I think it is related to her shoulder.  She actually has a little hydrocodone that she had filled she is never actually taken it so just encouraged her to maybe start with a half a tab or even a quarter the tab and  if she also has a muscle relaxer at home to maybe even try half a tab of that as well.  Did encourage her to hydrate as she really has not had a lot to drink today.  Constipation - used some mag citrate on Sat and that helped her move her bowels.   Neck pain -it is mostly lateral neck pain I wonder if it is actually the sternocleidomastoid.  As she does have some mild pain when she rotates the neck to the opposite side.  Recommend a trial of heating pad or ice whichever actually feels better some gentle stretches and again recommend a trial of pain medication to get some relief.  Still having abdominal pain and nausea.   So far normal endoscopy but still awaiting pathology reports from GI.  Unfortunately we still have a great explanation for her symptoms.  Can use zofrn or phenergan if needed.       I discussed the assessment and treatment plan with the patient. The patient was provided an opportunity to ask questions and all were answered. The patient agreed with the plan and demonstrated an understanding of the instructions.   The patient was advised to call back or seek an in-person evaluation if the symptoms worsen or if the condition fails to improve as anticipated.   Beatrice Lecher, MD

## 2018-12-19 DIAGNOSIS — R200 Anesthesia of skin: Secondary | ICD-10-CM | POA: Diagnosis not present

## 2018-12-19 DIAGNOSIS — R51 Headache: Secondary | ICD-10-CM | POA: Diagnosis not present

## 2018-12-19 DIAGNOSIS — R079 Chest pain, unspecified: Secondary | ICD-10-CM | POA: Diagnosis not present

## 2018-12-19 DIAGNOSIS — M542 Cervicalgia: Secondary | ICD-10-CM | POA: Diagnosis not present

## 2018-12-19 DIAGNOSIS — Z87891 Personal history of nicotine dependence: Secondary | ICD-10-CM | POA: Diagnosis not present

## 2018-12-20 DIAGNOSIS — R079 Chest pain, unspecified: Secondary | ICD-10-CM | POA: Diagnosis not present

## 2018-12-20 DIAGNOSIS — M542 Cervicalgia: Secondary | ICD-10-CM | POA: Diagnosis not present

## 2018-12-20 DIAGNOSIS — R200 Anesthesia of skin: Secondary | ICD-10-CM | POA: Diagnosis not present

## 2018-12-20 DIAGNOSIS — R51 Headache: Secondary | ICD-10-CM | POA: Diagnosis not present

## 2018-12-23 ENCOUNTER — Ambulatory Visit (INDEPENDENT_AMBULATORY_CARE_PROVIDER_SITE_OTHER): Payer: Medicare HMO | Admitting: Family Medicine

## 2018-12-23 ENCOUNTER — Encounter: Payer: Self-pay | Admitting: Family Medicine

## 2018-12-23 VITALS — BP 165/73 | HR 97 | Ht 62.0 in | Wt 223.0 lb

## 2018-12-23 DIAGNOSIS — R1013 Epigastric pain: Secondary | ICD-10-CM | POA: Insufficient documentation

## 2018-12-23 DIAGNOSIS — M542 Cervicalgia: Secondary | ICD-10-CM

## 2018-12-23 DIAGNOSIS — I10 Essential (primary) hypertension: Secondary | ICD-10-CM

## 2018-12-23 DIAGNOSIS — R0989 Other specified symptoms and signs involving the circulatory and respiratory systems: Secondary | ICD-10-CM

## 2018-12-23 DIAGNOSIS — R079 Chest pain, unspecified: Secondary | ICD-10-CM | POA: Diagnosis not present

## 2018-12-23 DIAGNOSIS — R6889 Other general symptoms and signs: Secondary | ICD-10-CM

## 2018-12-23 HISTORY — DX: Epigastric pain: R10.13

## 2018-12-23 MED ORDER — VERAPAMIL HCL 40 MG PO TABS: 40.0000 mg | ORAL_TABLET | Freq: Two times a day (BID) | ORAL | 1 refills | Status: DC

## 2018-12-23 NOTE — Patient Instructions (Signed)
Will discontinue amlodipine and start low-dose verapamil.  It can be taken up to 3 times a day but will just, start with twice a day to make sure that you tolerate it well.  You can even split the tab in half if needed for the first week just to make sure that you feel okay on it.  Continue to take the metoprolol.  Again I still think you can have some tachycardia episodes even on medication but I am looking for overall blood pressure control.

## 2018-12-23 NOTE — Assessment & Plan Note (Addendum)
She says she is try the amlodipine several times and just feels like it actually causes tachycardia after she takes it.  Will discontinue and try verapamil.  Will discontinue amlodipine and start low-dose verapamil.  It can be taken up to 3 times a day but will just, start with twice a day to make sure that you tolerate it well.  You can even split the tab in half if needed for the first week just to make sure that you feel okay on it.  Continue to take the metoprolol.  Again I still think you can have some tachycardia episodes even on medication but I am looking for overall blood pressure control.

## 2018-12-23 NOTE — Assessment & Plan Note (Signed)
Recently had endoscopy.  She says her biopsy showed some inflammation likely gastritis based on her description.  Her GI is actually planning on doing an MRI of the abdomen she has had a cholecystectomy but she still worried about a potential stone being trapped in the biliary tract.  She still having epigastric and right upper quadrant pain.
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
## 2018-12-24 ENCOUNTER — Ambulatory Visit: Payer: Self-pay | Admitting: Family Medicine

## 2018-12-24 DIAGNOSIS — R6889 Other general symptoms and signs: Secondary | ICD-10-CM | POA: Diagnosis not present

## 2018-12-24 DIAGNOSIS — E876 Hypokalemia: Secondary | ICD-10-CM | POA: Diagnosis not present

## 2018-12-24 LAB — BASIC METABOLIC PANEL
Creatinine: 0.7 (ref 0.5–1.1)
Glucose: 139
Potassium: 3.5 (ref 3.4–5.3)
Sodium: 141 (ref 137–147)

## 2019-01-03 DIAGNOSIS — Z87891 Personal history of nicotine dependence: Secondary | ICD-10-CM | POA: Diagnosis not present

## 2019-01-03 DIAGNOSIS — R0602 Shortness of breath: Secondary | ICD-10-CM | POA: Diagnosis not present

## 2019-01-03 DIAGNOSIS — R69 Illness, unspecified: Secondary | ICD-10-CM | POA: Diagnosis not present

## 2019-01-03 DIAGNOSIS — R072 Precordial pain: Secondary | ICD-10-CM | POA: Diagnosis not present

## 2019-01-03 DIAGNOSIS — R079 Chest pain, unspecified: Secondary | ICD-10-CM | POA: Diagnosis not present

## 2019-01-04 DIAGNOSIS — R072 Precordial pain: Secondary | ICD-10-CM | POA: Diagnosis not present

## 2019-01-04 DIAGNOSIS — R0602 Shortness of breath: Secondary | ICD-10-CM | POA: Diagnosis not present

## 2019-01-07 ENCOUNTER — Encounter: Payer: Self-pay | Admitting: Family Medicine

## 2019-01-07 ENCOUNTER — Ambulatory Visit (INDEPENDENT_AMBULATORY_CARE_PROVIDER_SITE_OTHER): Payer: Medicare HMO | Admitting: Family Medicine

## 2019-01-07 VITALS — BP 172/92 | Temp 96.0°F | Ht 62.0 in

## 2019-01-07 DIAGNOSIS — R220 Localized swelling, mass and lump, head: Secondary | ICD-10-CM | POA: Diagnosis not present

## 2019-01-07 DIAGNOSIS — J302 Other seasonal allergic rhinitis: Secondary | ICD-10-CM | POA: Diagnosis not present

## 2019-01-07 DIAGNOSIS — R1110 Vomiting, unspecified: Secondary | ICD-10-CM | POA: Diagnosis not present

## 2019-01-07 DIAGNOSIS — R059 Cough, unspecified: Secondary | ICD-10-CM

## 2019-01-07 DIAGNOSIS — R0789 Other chest pain: Secondary | ICD-10-CM | POA: Diagnosis not present

## 2019-01-07 DIAGNOSIS — R05 Cough: Secondary | ICD-10-CM

## 2019-01-07 DIAGNOSIS — R0602 Shortness of breath: Secondary | ICD-10-CM | POA: Diagnosis not present

## 2019-01-07 DIAGNOSIS — R0981 Nasal congestion: Secondary | ICD-10-CM | POA: Diagnosis not present

## 2019-01-07 DIAGNOSIS — I10 Essential (primary) hypertension: Secondary | ICD-10-CM | POA: Diagnosis not present

## 2019-01-07 NOTE — Progress Notes (Signed)
Virtual Visit via Video Note  I connected with Jennifer [name] on 01/07/19 at  9:50 AM EDT by a video enabled telemedicine application and verified that I am speaking with the correct person using two identifiers.   I discussed the limitations of evaluation and management by telemedicine and the availability of in person appointments. The patient expressed understanding and agreed to proceed.  Pt was at home and I was in my office for the virtual visit.     Subjective:    CC: cough and swelling in her throat.    HPI: She actually went to the emergency department last week on May 22 for anxiety and precordial chest pain as well as dyspnea.    She reports that she has been up all night.   Feels like her tongue is swollen, coughing all night. Feels like something is stuck in her throat. She said that she was out walking yesterday and just got SOB. Asked if she felt any chest tightness. She does not. She was coughing last night. She feels like she has nasal congestion. Feels like her cervical lymph nodes are swollen.  She said that her tongue feels swollen because she possibly got something hung up in her throat. She's just really unsure and just had a rough night. She was dry heaving last night. She feels like she is tasting bile in the back of her throat. No pain in the ears but some fullness.  She did a nasal rinse last rinse. Hasn't been using her nasal spray.    She did do her peak flows and was in the green zone.  She did eat peanuts yesterday.  HTN - she hasn't taken her BP meds yet today.  She has plenty of metoprolol and doesn't need refills.   Past medical history, Surgical history, Family history not pertinant except as noted below, Social history, Allergies, and medications have been entered into the medical record, reviewed, and corrections made.   Review of Systems: No fevers, chills, night sweats, weight loss, chest pain, or shortness of breath.   Objective:    General:  Speaking clearly in complete sentences without any shortness of breath.  Alert and oriented x3.  Normal judgment. No apparent acute distress. Walking around well groomed.    Impression and Recommendations:   Atypical chest pain -  Resolved.    Shortness of breath - reassured her that she is blowing tin the green zone.  Feels more SOB when lays down and bends over.   Sore throat - Painful to drink water.  Make sure   Cough/dry heaves - could be viral or related to sinusitis.   Nasal congestion - restart nasal steroid spray. OK to use her benadryl. Suspect sinusitis will tx with clindamycin. She still has an old scripts.   Mild swelling in her tongue - she did get into some milk products yesterday which can often cause a thickness in her throat.   HTN - uncontrolled. Encourage her to take her medicine today.   She has a sore on her foot that she would like me to look at on her foot.     I discussed the assessment and treatment plan with the patient. The patient was provided an opportunity to ask questions and all were answered. The patient agreed with the plan and demonstrated an understanding of the instructions.   The patient was advised to call back or seek an in-person evaluation if the symptoms worsen or if the condition fails to  improve as anticipated.   Jennifer Lecher, MD

## 2019-01-07 NOTE — Patient Instructions (Addendum)
Restart nasal steroid spray.  Restart Benadryl.   Will start clindamycin 150mg  TID.

## 2019-01-07 NOTE — Progress Notes (Signed)
Called pt and her phone was going in and out. Told her that I would hang up and call her back.   She hasn't taken any of her bp medications this morning.  She reports that she has been up all night.   Feels like her tongue is swollen, coughing all night. Feels like something is stuck in her throat. She said that she was out walking yesterday and just got SOB. Asked if she felt any chest tightness. She does not. She said that her tongue feels swollen because she possibly got something hung up in her throat. She's just really unsure and just had a rough night.  She did do her peak flows and was in the green zone. Maryruth Eve, Lahoma Crocker, CMA

## 2019-01-08 DIAGNOSIS — R0602 Shortness of breath: Secondary | ICD-10-CM | POA: Diagnosis not present

## 2019-01-08 DIAGNOSIS — M542 Cervicalgia: Secondary | ICD-10-CM | POA: Diagnosis not present

## 2019-01-08 DIAGNOSIS — Z87891 Personal history of nicotine dependence: Secondary | ICD-10-CM | POA: Diagnosis not present

## 2019-01-08 DIAGNOSIS — Z20828 Contact with and (suspected) exposure to other viral communicable diseases: Secondary | ICD-10-CM | POA: Diagnosis not present

## 2019-01-10 ENCOUNTER — Telehealth: Payer: Self-pay | Admitting: Family Medicine

## 2019-01-10 DIAGNOSIS — G629 Polyneuropathy, unspecified: Secondary | ICD-10-CM

## 2019-01-10 DIAGNOSIS — R279 Unspecified lack of coordination: Secondary | ICD-10-CM

## 2019-01-10 NOTE — Telephone Encounter (Signed)
Patient calling in stating that she needs a referral to go to [facility] for a Dr.Hill. She needs to establish as a new patient again. Please advise.

## 2019-01-10 NOTE — Telephone Encounter (Signed)
Ok to place [employer].

## 2019-01-10 NOTE — Telephone Encounter (Signed)
Referral placed.Marland KitchenMarland KitchenElouise [name], [place]

## 2019-01-14 DIAGNOSIS — R9431 Abnormal electrocardiogram [ECG] [EKG]: Secondary | ICD-10-CM | POA: Diagnosis not present

## 2019-01-14 DIAGNOSIS — Z87891 Personal history of nicotine dependence: Secondary | ICD-10-CM | POA: Diagnosis not present

## 2019-01-14 DIAGNOSIS — E119 Type 2 diabetes mellitus without complications: Secondary | ICD-10-CM | POA: Diagnosis not present

## 2019-01-14 DIAGNOSIS — K219 Gastro-esophageal reflux disease without esophagitis: Secondary | ICD-10-CM | POA: Diagnosis not present

## 2019-01-14 DIAGNOSIS — Z885 Allergy status to narcotic agent status: Secondary | ICD-10-CM | POA: Diagnosis not present

## 2019-01-14 DIAGNOSIS — I10 Essential (primary) hypertension: Secondary | ICD-10-CM | POA: Diagnosis not present

## 2019-01-14 DIAGNOSIS — Z881 Allergy status to other antibiotic agents status: Secondary | ICD-10-CM | POA: Diagnosis not present

## 2019-01-14 DIAGNOSIS — M546 Pain in thoracic spine: Secondary | ICD-10-CM | POA: Diagnosis not present

## 2019-01-14 DIAGNOSIS — R05 Cough: Secondary | ICD-10-CM | POA: Diagnosis not present

## 2019-01-14 DIAGNOSIS — E785 Hyperlipidemia, unspecified: Secondary | ICD-10-CM | POA: Diagnosis not present

## 2019-01-14 DIAGNOSIS — R079 Chest pain, unspecified: Secondary | ICD-10-CM | POA: Diagnosis not present

## 2019-01-14 DIAGNOSIS — Z91018 Allergy to other foods: Secondary | ICD-10-CM | POA: Diagnosis not present

## 2019-01-14 DIAGNOSIS — Z888 Allergy status to other drugs, medicaments and biological substances status: Secondary | ICD-10-CM | POA: Diagnosis not present

## 2019-01-15 ENCOUNTER — Encounter: Payer: Self-pay | Admitting: Family Medicine

## 2019-01-15 ENCOUNTER — Ambulatory Visit (INDEPENDENT_AMBULATORY_CARE_PROVIDER_SITE_OTHER): Payer: Medicare HMO | Admitting: Family Medicine

## 2019-01-15 VITALS — BP 158/85 | HR 99 | Ht 62.0 in | Wt 225.0 lb

## 2019-01-15 DIAGNOSIS — R0981 Nasal congestion: Secondary | ICD-10-CM

## 2019-01-15 DIAGNOSIS — R609 Edema, unspecified: Secondary | ICD-10-CM

## 2019-01-15 DIAGNOSIS — M545 Low back pain, unspecified: Secondary | ICD-10-CM

## 2019-01-15 DIAGNOSIS — I10 Essential (primary) hypertension: Secondary | ICD-10-CM | POA: Diagnosis not present

## 2019-01-15 DIAGNOSIS — R234 Changes in skin texture: Secondary | ICD-10-CM

## 2019-01-15 MED ORDER — SPIRONOLACTONE 25 MG PO TABS: ORAL_TABLET | ORAL | 1 refills | Status: DC

## 2019-01-15 MED ORDER — SALICYLIC ACID 17 % EX SOLN: Freq: Every day | CUTANEOUS | 0 refills | Status: DC

## 2019-01-15 NOTE — Telephone Encounter (Signed)
Referral Sent - CF °

## 2019-01-15 NOTE — Assessment & Plan Note (Signed)
Encouraged to start BP medicaiton.  Use spironolactone for a couple of days to help with swelling. She feels like if she takes it too much she gets too dry.
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
## 2019-01-16 ENCOUNTER — Encounter: Payer: Self-pay | Admitting: Family Medicine

## 2019-01-16 ENCOUNTER — Telehealth: Payer: Self-pay

## 2019-01-16 DIAGNOSIS — X58XXXA Exposure to other specified factors, initial encounter: Secondary | ICD-10-CM | POA: Diagnosis not present

## 2019-01-16 DIAGNOSIS — T781XXA Other adverse food reactions, not elsewhere classified, initial encounter: Secondary | ICD-10-CM | POA: Diagnosis not present

## 2019-01-16 DIAGNOSIS — Y999 Unspecified external cause status: Secondary | ICD-10-CM | POA: Diagnosis not present

## 2019-01-16 DIAGNOSIS — R220 Localized swelling, mass and lump, head: Secondary | ICD-10-CM | POA: Diagnosis not present

## 2019-01-16 NOTE — Telephone Encounter (Signed)
OK, let us have her try Cereve over-the-counter.  It actually has a moisturizer and a keratolytic together.  I want her to put this on twice a day liberally and see if her skin improves over the next 3 to 4 weeks.

## 2019-01-16 NOTE — Telephone Encounter (Signed)
Jennifer [name] called stating the pharmacy is trying to reach [place]. I called the pharmacy and they state they have salicylic acid 17 % (Compound W) OTC, they however do not have salicylic acid-Lactic acid 17%.

## 2019-01-17 ENCOUNTER — Emergency Department (HOSPITAL_BASED_OUTPATIENT_CLINIC_OR_DEPARTMENT_OTHER)
Admission: EM | Admit: 2019-01-17 | Discharge: 2019-01-17 | Disposition: A | Discharge status: Home / Self Care | Payer: Medicare HMO | Attending: Emergency Medicine | Admitting: Emergency Medicine

## 2019-01-17 ENCOUNTER — Emergency Department (HOSPITAL_BASED_OUTPATIENT_CLINIC_OR_DEPARTMENT_OTHER): Payer: Medicare HMO

## 2019-01-17 ENCOUNTER — Other Ambulatory Visit: Payer: Self-pay

## 2019-01-17 ENCOUNTER — Encounter (HOSPITAL_BASED_OUTPATIENT_CLINIC_OR_DEPARTMENT_OTHER): Payer: Self-pay

## 2019-01-17 DIAGNOSIS — R000 Tachycardia, unspecified: Secondary | ICD-10-CM | POA: Diagnosis not present

## 2019-01-17 DIAGNOSIS — Z87891 Personal history of nicotine dependence: Secondary | ICD-10-CM | POA: Insufficient documentation

## 2019-01-17 DIAGNOSIS — R110 Nausea: Secondary | ICD-10-CM | POA: Diagnosis not present

## 2019-01-17 DIAGNOSIS — R1011 Right upper quadrant pain: Secondary | ICD-10-CM | POA: Diagnosis not present

## 2019-01-17 DIAGNOSIS — R109 Unspecified abdominal pain: Secondary | ICD-10-CM

## 2019-01-17 DIAGNOSIS — Z9049 Acquired absence of other specified parts of digestive tract: Secondary | ICD-10-CM | POA: Diagnosis not present

## 2019-01-17 DIAGNOSIS — Z79899 Other long term (current) drug therapy: Secondary | ICD-10-CM | POA: Insufficient documentation

## 2019-01-17 DIAGNOSIS — I10 Essential (primary) hypertension: Secondary | ICD-10-CM | POA: Insufficient documentation

## 2019-01-17 DIAGNOSIS — T781XXA Other adverse food reactions, not elsewhere classified, initial encounter: Secondary | ICD-10-CM | POA: Diagnosis not present

## 2019-01-17 DIAGNOSIS — J45909 Unspecified asthma, uncomplicated: Secondary | ICD-10-CM | POA: Diagnosis not present

## 2019-01-17 DIAGNOSIS — X58XXXA Exposure to other specified factors, initial encounter: Secondary | ICD-10-CM | POA: Diagnosis not present

## 2019-01-17 DIAGNOSIS — Y999 Unspecified external cause status: Secondary | ICD-10-CM | POA: Diagnosis not present

## 2019-01-17 DIAGNOSIS — R220 Localized swelling, mass and lump, head: Secondary | ICD-10-CM | POA: Diagnosis not present

## 2019-01-17 LAB — URINALYSIS, ROUTINE W REFLEX MICROSCOPIC
Bilirubin Urine: NEGATIVE
Glucose, UA: NEGATIVE mg/dL
Hgb urine dipstick: NEGATIVE
Ketones, ur: 15 mg/dL — AB
Leukocytes,Ua: NEGATIVE
Nitrite: NEGATIVE
Protein, ur: NEGATIVE mg/dL
Specific Gravity, Urine: >1.03 — ABNORMAL HIGH (ref 1.005–1.030)
pH: 5.5 (ref 5.0–8.0)

## 2019-01-17 NOTE — ED Notes (Signed)
Pt given urine specimen cup.

## 2019-01-17 NOTE — ED Notes (Addendum)
Pt also states that she feels like her tongue has been swelling. Denies difficulty breathing at this time. Pt verbalized 500mg  tylenol at 3pm today.
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
## 2019-01-17 NOTE — ED Triage Notes (Signed)
Pt c/o RUQ and LUQ abdominal pain that radiates into back x 5 days with nausea. Also c/o redness to face.

## 2019-01-17 NOTE — Discharge Instructions (Addendum)
Your x-ray today is negative.  Your urine sample did not show signs of infection or bleeding to suggest kidney stone.  There are many causes of abdominal pain. Most pain is not serious and goes away, but some pain gets worse, changes, or will not go away. Please return to the emergency department or see your doctor right away if you (or your family member) experience any of the following:   Pain that gets worse or moves to just one spot.  Pain that gets worse if you cough or sneeze.  Pain with going over a bump in the road.  Pain that does not get better in 24 hours.  Inability to keep down liquids (vomiting)--especially if you are making less urine.  Fainting.  Blood in the vomit or stool.  High fever or shaking chills.  Swelling of the abdomen.  Any new or worsening problem.   Follow-up Instructions  See your primary care provider in 2-5 days for follow up It is important that you follow-up with your GI doctor. Medications  Take the following medications:    Additional Instructions  No alcohol.  No caffeine, aspirin, or cigarettes.

## 2019-01-17 NOTE — ED Notes (Signed)
Pt reports taking 500mg  tylenol PTA

## 2019-01-17 NOTE — Telephone Encounter (Signed)
Patient advised of recommendations.  

## 2019-01-17 NOTE — ED Notes (Signed)
Urine culture sent to lab with specimen 

## 2019-01-20 DIAGNOSIS — M545 Low back pain: Secondary | ICD-10-CM | POA: Diagnosis not present

## 2019-01-20 DIAGNOSIS — R109 Unspecified abdominal pain: Secondary | ICD-10-CM | POA: Diagnosis not present

## 2019-01-20 DIAGNOSIS — R1084 Generalized abdominal pain: Secondary | ICD-10-CM | POA: Diagnosis not present

## 2019-01-21 ENCOUNTER — Telehealth: Payer: Self-pay

## 2019-01-21 ENCOUNTER — Encounter: Payer: Self-pay | Admitting: Family Medicine

## 2019-01-21 ENCOUNTER — Ambulatory Visit (INDEPENDENT_AMBULATORY_CARE_PROVIDER_SITE_OTHER): Payer: Medicare HMO | Admitting: Family Medicine

## 2019-01-21 VITALS — BP 142/85 | HR 88 | Ht 62.0 in | Wt 222.0 lb

## 2019-01-21 DIAGNOSIS — R221 Localized swelling, mass and lump, neck: Secondary | ICD-10-CM

## 2019-01-21 DIAGNOSIS — R1013 Epigastric pain: Secondary | ICD-10-CM

## 2019-01-21 DIAGNOSIS — R682 Dry mouth, unspecified: Secondary | ICD-10-CM

## 2019-01-21 DIAGNOSIS — R109 Unspecified abdominal pain: Secondary | ICD-10-CM

## 2019-01-21 DIAGNOSIS — E876 Hypokalemia: Secondary | ICD-10-CM | POA: Diagnosis not present

## 2019-01-21 DIAGNOSIS — I10 Essential (primary) hypertension: Secondary | ICD-10-CM | POA: Diagnosis not present

## 2019-01-21 DIAGNOSIS — R110 Nausea: Secondary | ICD-10-CM

## 2019-01-21 MED ORDER — POTASSIUM CHLORIDE CRYS ER 20 MEQ PO TBCR: 20.0000 meq | EXTENDED_RELEASE_TABLET | Freq: Every day | ORAL | 3 refills | Status: DC

## 2019-01-21 NOTE — Progress Notes (Signed)
Virtual Visit via Video Note  I connected with Jennifer [name] on 01/22/19 at  1:40 PM EDT by a video enabled telemedicine application and verified that I am speaking with the correct person using two identifiers.   I discussed the limitations of evaluation and management by telemedicine and the availability of in person appointments. The patient expressed understanding and agreed to proceed.  Subjective:    CC: abdominal pain  HPI:  Urinating frequently, somewhat of an odor and slight pain in her pelvic area. Pain in flank R sided, nausea, x 2 weeks and has gotten gradually worse and her tongue is giving her problems and her neck is too. Right flank pain is colicky and when it hurts she feels very nauseated.  Mouth is dry and her tongue feels thick. She's been using the nystatin mouth wash, noticed some white patches.   She went to the ED yesterday and they checked her urine but she had some issues w/her labs. She also did a renal US. She stated that she feels like she has to vomit and she has had chills.  US shows cortical thinning in the right kidney. CMP was normal. Her UA is negative. She has been taking tylenol but not really helping.  She says bending and twisting doesn't aggravate it.   Her BS was 177 but it dropped to 88. Her sugars were elevated during the time her blood sugar was elevated. She stated that she has not been drinking more than she usually does and she has slowed down today.  Stomach has been churning and she has felt very nauseated.  She was taking steroid for about 2 days for the swelling in her night but caused insomnia so she stopped them.   Hx of cholecystectomy.    Past medical history, Surgical history, Family history not pertinant except as noted below, Social history, Allergies, and medications have been entered into the medical record, reviewed, and corrections made.   Review of Systems: No fevers, chills, night sweats, weight loss, chest pain, or shortness  of breath.   Objective:    General: Speaking clearly in complete sentences without any shortness of breath.  Alert and oriented x3.  Normal judgment. No apparent acute distress. WEll groomed.     Impression and Recommendations:   Dyspepsia/Nausea - no sick contacts.  She is not sure if has nausea medication. Rx sent to pharmacy. Reminded her that her allergy testing showed sensitivity to wheat, oats, rye, casein and malt. Really needs to avoid bread which she has been eating a lot of lately.   Dry mouth - Increase hydration. Complete nystatin swish and swallow that she started.    HTN -blood pressure still elevated today and not at goal.  Though it is better than it was previously.  Really encourage her to start a second blood pressure medication.  At this point she is starting to show some signs of potential cortical thinning on recent ultrasound of 1 of her kidneys I am concerned that it is coming from uncontrolled blood pressure over the last 2 years.  Right flank pain  - follow up with DR. Rhoton, her gastroenterologist.  She spoke with Manuela Schwartz the nurse. Unlikely to be a kidney stones. May also be musculoskeletal. Last CT abdomen done 09/2018 showed not worrisome finding. I think she should get back in with Dr. Darene Lamer for her back if it keeps hurting.    Thick tongue - I really feel this is a recurring issues for her.  I don't know what has triggered it this time. Will check for some specific allergies.  Will check ACE levels as well.     Time spent 40 minutes in non-face-to-face encounter.  More than 50% of time spent counseling about hypertension, flank pain, throat swelling, dyspepsia, nausea, dry mouth.  I discussed the assessment and treatment plan with the patient. The patient was provided an opportunity to ask questions and all were answered. The patient agreed with the plan and demonstrated an understanding of the instructions.   The patient was advised to call back or seek an in-person  evaluation if the symptoms worsen or if the condition fails to improve as anticipated.   Beatrice Lecher, MD

## 2019-01-21 NOTE — Progress Notes (Signed)
Urinating frequently, somewhat of an odor and slight pain in her pelvic area. Pain in flank R sided, nausea, x 2 weeks and has gotten gradually worse and her tongue is giving her problems and her neck is too.  Mouth is dry. She's been using the nystatin mouth wash, noticed some white patches.   She went to the ED yesterday and they checked her urine but she had some issues w/her labs. She also did a renal US. She stated that she feels like she has to vomit and she has had chills.   Her BS was 177 but it dropped to 88. Her sugars were elevated during the time her blood sugar was elevated. She stated that she has not been drinking more than she usually does and she has slowed down today.  Maryruth Eve, Lahoma Crocker, CMA

## 2019-01-21 NOTE — Telephone Encounter (Signed)
My chart and prescription for potassium sent.

## 2019-01-21 NOTE — Telephone Encounter (Signed)
Jennifer [name] called to report her Potassium results. She states when it is that low they usually give her potassium. She is wanted to know how to take it and when she can restart the spironolactone. Please advise.    Potassium 3.5

## 2019-01-22 ENCOUNTER — Encounter: Payer: Self-pay | Admitting: Family Medicine

## 2019-01-22 ENCOUNTER — Telehealth: Payer: Self-pay

## 2019-01-22 DIAGNOSIS — Z886 Allergy status to analgesic agent status: Secondary | ICD-10-CM | POA: Diagnosis not present

## 2019-01-22 DIAGNOSIS — J45909 Unspecified asthma, uncomplicated: Secondary | ICD-10-CM | POA: Diagnosis not present

## 2019-01-22 DIAGNOSIS — K5730 Diverticulosis of large intestine without perforation or abscess without bleeding: Secondary | ICD-10-CM | POA: Diagnosis not present

## 2019-01-22 DIAGNOSIS — Z880 Allergy status to penicillin: Secondary | ICD-10-CM | POA: Diagnosis not present

## 2019-01-22 DIAGNOSIS — R1084 Generalized abdominal pain: Secondary | ICD-10-CM | POA: Diagnosis not present

## 2019-01-22 DIAGNOSIS — I10 Essential (primary) hypertension: Secondary | ICD-10-CM

## 2019-01-22 DIAGNOSIS — Z881 Allergy status to other antibiotic agents status: Secondary | ICD-10-CM | POA: Diagnosis not present

## 2019-01-22 DIAGNOSIS — Z888 Allergy status to other drugs, medicaments and biological substances status: Secondary | ICD-10-CM | POA: Diagnosis not present

## 2019-01-22 DIAGNOSIS — R112 Nausea with vomiting, unspecified: Secondary | ICD-10-CM | POA: Diagnosis not present

## 2019-01-22 DIAGNOSIS — Z9851 Tubal ligation status: Secondary | ICD-10-CM | POA: Diagnosis not present

## 2019-01-22 DIAGNOSIS — Z79899 Other long term (current) drug therapy: Secondary | ICD-10-CM | POA: Diagnosis not present

## 2019-01-22 DIAGNOSIS — Z882 Allergy status to sulfonamides status: Secondary | ICD-10-CM | POA: Diagnosis not present

## 2019-01-22 DIAGNOSIS — R109 Unspecified abdominal pain: Secondary | ICD-10-CM | POA: Diagnosis not present

## 2019-01-22 DIAGNOSIS — R1011 Right upper quadrant pain: Secondary | ICD-10-CM | POA: Diagnosis not present

## 2019-01-22 DIAGNOSIS — Z885 Allergy status to narcotic agent status: Secondary | ICD-10-CM | POA: Diagnosis not present

## 2019-01-22 MED ORDER — EPINEPHRINE 0.15 MG/0.3ML IJ SOAJ: 0.1500 mg | INTRAMUSCULAR | 0 refills | Status: DC | PRN

## 2019-01-22 MED ORDER — EPINEPHRINE 0.3 MG/0.3ML IJ SOAJ: 0.3000 mg | INTRAMUSCULAR | 0 refills | Status: DC | PRN

## 2019-01-22 MED ORDER — ONDANSETRON HCL 4 MG PO TABS: 4.0000 mg | ORAL_TABLET | Freq: Three times a day (TID) | ORAL | 0 refills | Status: DC | PRN

## 2019-01-22 NOTE — Telephone Encounter (Signed)
Pharmacist Barnetta Chapel advised, verbal given to change RX. I have updated med list also.

## 2019-01-22 NOTE — Telephone Encounter (Signed)
It should just be the regular EpiPen not the Junior.  I apologize for the inconvenience and please tell them thank you for catching the air.

## 2019-01-22 NOTE — Telephone Encounter (Signed)
Jennifer [name] from Jennifer [name] called to clarify RX for EpiPen. It came across as "EpiPen Jennifer [name]" and they wanted to make sure that is what provider meant to send   Please advise

## 2019-01-23 DIAGNOSIS — G35 Multiple sclerosis: Secondary | ICD-10-CM | POA: Diagnosis not present

## 2019-01-23 DIAGNOSIS — R2681 Unsteadiness on feet: Secondary | ICD-10-CM | POA: Diagnosis not present

## 2019-01-23 DIAGNOSIS — R4182 Altered mental status, unspecified: Secondary | ICD-10-CM | POA: Diagnosis not present

## 2019-01-24 ENCOUNTER — Emergency Department (HOSPITAL_BASED_OUTPATIENT_CLINIC_OR_DEPARTMENT_OTHER)
Admission: EM | Admit: 2019-01-24 | Discharge: 2019-01-24 | Disposition: A | Discharge status: Home / Self Care | Payer: Medicare HMO | Attending: Emergency Medicine | Admitting: Emergency Medicine

## 2019-01-24 ENCOUNTER — Other Ambulatory Visit: Payer: Self-pay

## 2019-01-24 ENCOUNTER — Encounter (HOSPITAL_BASED_OUTPATIENT_CLINIC_OR_DEPARTMENT_OTHER): Payer: Self-pay | Admitting: *Deleted

## 2019-01-24 DIAGNOSIS — J4530 Mild persistent asthma, uncomplicated: Secondary | ICD-10-CM | POA: Diagnosis not present

## 2019-01-24 DIAGNOSIS — Z87891 Personal history of nicotine dependence: Secondary | ICD-10-CM | POA: Diagnosis not present

## 2019-01-24 DIAGNOSIS — I10 Essential (primary) hypertension: Secondary | ICD-10-CM | POA: Diagnosis not present

## 2019-01-24 DIAGNOSIS — Z880 Allergy status to penicillin: Secondary | ICD-10-CM | POA: Diagnosis not present

## 2019-01-24 DIAGNOSIS — Z79899 Other long term (current) drug therapy: Secondary | ICD-10-CM | POA: Diagnosis not present

## 2019-01-24 DIAGNOSIS — R0789 Other chest pain: Secondary | ICD-10-CM | POA: Diagnosis not present

## 2019-01-24 DIAGNOSIS — R079 Chest pain, unspecified: Secondary | ICD-10-CM | POA: Diagnosis not present

## 2019-01-24 DIAGNOSIS — R0602 Shortness of breath: Secondary | ICD-10-CM | POA: Diagnosis present
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
## 2019-01-24 NOTE — ED Notes (Addendum)
C/o sob and rt "lung pain" after being in an old house yesterday pain is sharp w inspiration  States feels like she can"t get her breath  Used inhaler yesterday,  Also rt flank pain w no er unable able to find cause   Pt states she can not put on a gown  "they cause her to break out"
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
## 2019-01-26 DIAGNOSIS — E876 Hypokalemia: Secondary | ICD-10-CM | POA: Diagnosis not present

## 2019-01-26 DIAGNOSIS — R079 Chest pain, unspecified: Secondary | ICD-10-CM | POA: Diagnosis not present

## 2019-01-26 DIAGNOSIS — R9431 Abnormal electrocardiogram [ECG] [EKG]: Secondary | ICD-10-CM | POA: Diagnosis not present

## 2019-01-26 DIAGNOSIS — R072 Precordial pain: Secondary | ICD-10-CM | POA: Diagnosis not present

## 2019-01-29 ENCOUNTER — Encounter: Payer: Self-pay | Admitting: Family Medicine

## 2019-01-29 ENCOUNTER — Ambulatory Visit (INDEPENDENT_AMBULATORY_CARE_PROVIDER_SITE_OTHER): Payer: Medicare HMO | Admitting: Family Medicine

## 2019-01-29 ENCOUNTER — Encounter: Payer: Medicare HMO | Admitting: Family Medicine

## 2019-01-29 VITALS — BP 154/80 | HR 98 | Ht 62.0 in | Wt 225.0 lb

## 2019-01-29 DIAGNOSIS — Z79899 Other long term (current) drug therapy: Secondary | ICD-10-CM | POA: Diagnosis not present

## 2019-01-29 DIAGNOSIS — I10 Essential (primary) hypertension: Secondary | ICD-10-CM | POA: Diagnosis not present

## 2019-01-29 DIAGNOSIS — R079 Chest pain, unspecified: Secondary | ICD-10-CM

## 2019-01-29 DIAGNOSIS — M79671 Pain in right foot: Secondary | ICD-10-CM | POA: Diagnosis not present

## 2019-01-29 DIAGNOSIS — R0789 Other chest pain: Secondary | ICD-10-CM | POA: Diagnosis not present

## 2019-01-29 DIAGNOSIS — R059 Cough, unspecified: Secondary | ICD-10-CM

## 2019-01-29 DIAGNOSIS — M79672 Pain in left foot: Secondary | ICD-10-CM | POA: Diagnosis not present

## 2019-01-29 DIAGNOSIS — E876 Hypokalemia: Secondary | ICD-10-CM

## 2019-01-29 DIAGNOSIS — R05 Cough: Secondary | ICD-10-CM

## 2019-01-29 DIAGNOSIS — R221 Localized swelling, mass and lump, neck: Secondary | ICD-10-CM | POA: Diagnosis not present

## 2019-01-29 NOTE — Assessment & Plan Note (Signed)
Encouraged her to stay with the potassium 15 mL's daily even though she is starting the spironolactone and I want her to do that for a week and then go to the lab early next week either between Monday and Wednesday to recheck her potassium.  If it is back into the normal range at that point in time then we will have her stop the potassium and just use her Spironolactone.  Make sure you are staying well-hydrated.
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
## 2019-01-29 NOTE — Assessment & Plan Note (Signed)
Audiology had mentioned maybe switching to propranolol and then doing a stress test.  She has held off because she thought that if she switched she had to have a stress test.  I explained that the stress test is not because a potential side effect of the propranolol.  Her cardiologist probably just wanted to get it scheduled.  But she would prefer that we call their office to clarify.

## 2019-01-29 NOTE — Assessment & Plan Note (Signed)
I think the chest pressure that is radiating into both arms that she is experiencing which right now is little bit worse on the right side is related to her fibromyalgia.  I feel like and having seen her over the years she has these flares where her upper body and chest and arms and sometimes up even into her neck will feel achy and uncomfortable and sometimes some pressure.  Reassured her that I do really do not think it is cardiac.  I still would like for her to consider medication for fibromyalgia.

## 2019-01-29 NOTE — Telephone Encounter (Signed)
Please call her GI office Dr. Dinah Beers office at [facility], his nurse's name is Manuela Schwartz.  Evidently they have been trying to work on getting an MRI of her abdomen approved.  We are happy to try to see if we can help at all.  Patient had mentioned to [place] that she was told to try to see if her PCPs can get it approved.  I does need to know specifically what test they are ordering.  Is an MRI abdomen with and without contrast?.

## 2019-01-29 NOTE — Telephone Encounter (Signed)
Called and left msg for Manuela Schwartz [phone]). Left her my direct call back info and asked she let me know what I can do to help.

## 2019-01-29 NOTE — Patient Instructions (Signed)
Start our spirinolactone and start 30ml of you potassium daily.  Go to the lab next week to recheck potassium

## 2019-01-29 NOTE — Assessment & Plan Note (Signed)
Hypertension is uncontrolled today.  She just has such a fear of starting new medications and this is a huge barrier and really controlling her blood pressure.  She feels like the metoprolol is not effective and I am not convinced that that is actually true it probably is lowering her blood pressure by about 5-10 points but unfortunately she is going to need additional medication to really truly control her pressure.  Have discussed this previously.  Strongly encouraged her to go ahead and start her Spironolactone which she already has at home.

## 2019-01-30 ENCOUNTER — Encounter: Payer: Self-pay | Admitting: Family Medicine

## 2019-01-31 ENCOUNTER — Encounter: Payer: Self-pay | Admitting: Family Medicine

## 2019-01-31 DIAGNOSIS — R079 Chest pain, unspecified: Secondary | ICD-10-CM | POA: Diagnosis not present

## 2019-01-31 DIAGNOSIS — R002 Palpitations: Secondary | ICD-10-CM | POA: Diagnosis not present

## 2019-01-31 DIAGNOSIS — R072 Precordial pain: Secondary | ICD-10-CM | POA: Diagnosis not present

## 2019-02-01 DIAGNOSIS — R002 Palpitations: Secondary | ICD-10-CM | POA: Diagnosis not present

## 2019-02-01 DIAGNOSIS — R072 Precordial pain: Secondary | ICD-10-CM | POA: Diagnosis not present

## 2019-02-03 ENCOUNTER — Ambulatory Visit: Payer: Medicare HMO | Admitting: Family Medicine

## 2019-02-03 ENCOUNTER — Encounter: Payer: Self-pay | Admitting: Family Medicine

## 2019-02-03 ENCOUNTER — Other Ambulatory Visit: Payer: Self-pay

## 2019-02-03 VITALS — BP 162/88 | HR 85 | Ht 62.0 in | Wt 225.0 lb

## 2019-02-03 DIAGNOSIS — M79672 Pain in left foot: Secondary | ICD-10-CM | POA: Diagnosis not present

## 2019-02-03 DIAGNOSIS — M79671 Pain in right foot: Secondary | ICD-10-CM | POA: Diagnosis not present

## 2019-02-03 DIAGNOSIS — E876 Hypokalemia: Secondary | ICD-10-CM | POA: Diagnosis not present

## 2019-02-03 LAB — BASIC METABOLIC PANEL
BUN: 11 (ref 4–21)
Glucose: 141
Potassium: 4 (ref 3.4–5.3)
Sodium: 140 (ref 137–147)

## 2019-02-03 NOTE — Patient Instructions (Signed)
You have plantar fasciitis Tylenol 500mg  1-2 tabs three times a day. Topical biofreeze may help with the pain. Consider voltaren gel up to 4 times a day to help with pain and inflammation - you can get tubes for $10-18 at [place] now. Plantar fascia stretch for 20-30 seconds (do 3 of these) in morning Lowering/raise on a step exercises 3 x 10 once or twice a day - this is very important for long term recovery. Ice heel for 15 minutes as needed. Avoid flat shoes/barefoot walking as much as possible. Arch straps have been shown to help with pain. Inserts are important (dr. Zoe Lan active series, spencos, our green insoles, custom orthotics) - you MUST wear shoes that have good arch support. Physical therapy is also an option. Follow up with me in 1 month.

## 2019-02-04 ENCOUNTER — Ambulatory Visit: Payer: Medicare HMO | Admitting: Family Medicine

## 2019-02-04 ENCOUNTER — Encounter: Payer: Self-pay | Admitting: Family Medicine

## 2019-02-04 ENCOUNTER — Telehealth: Payer: Self-pay | Admitting: Cardiology
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
## 2019-02-04 NOTE — Telephone Encounter (Signed)
Patient calling about message she sent via my chart.  She would like nurse to give her a call.

## 2019-02-05 ENCOUNTER — Telehealth: Payer: Self-pay

## 2019-02-05 DIAGNOSIS — R079 Chest pain, unspecified: Secondary | ICD-10-CM | POA: Diagnosis not present

## 2019-02-05 DIAGNOSIS — R9431 Abnormal electrocardiogram [ECG] [EKG]: Secondary | ICD-10-CM | POA: Diagnosis not present

## 2019-02-05 DIAGNOSIS — R072 Precordial pain: Secondary | ICD-10-CM | POA: Diagnosis not present

## 2019-02-05 DIAGNOSIS — Z87891 Personal history of nicotine dependence: Secondary | ICD-10-CM | POA: Diagnosis not present

## 2019-02-05 DIAGNOSIS — I10 Essential (primary) hypertension: Secondary | ICD-10-CM | POA: Diagnosis not present

## 2019-02-05 NOTE — Telephone Encounter (Signed)
I know you were speaking back and forth with this patient on MyChart last week. She is requesting you call her. Thanks!

## 2019-02-05 NOTE — Telephone Encounter (Signed)
Pt stated that she is not taking verapamil.  She said that you and her talked about her not taking that at the last visit.

## 2019-02-05 NOTE — Progress Notes (Signed)
Error. Canceled appt.

## 2019-02-05 NOTE — Telephone Encounter (Signed)
Spoke with patient who is asking about medications not testing, informed that her message has been routed to [facility]' office and they will follow-up with her.

## 2019-02-05 NOTE — Telephone Encounter (Signed)
Make sure she is taking her metoprolol and spironolactone and verapamil.

## 2019-02-06 ENCOUNTER — Telehealth: Payer: Self-pay | Admitting: Family Medicine

## 2019-02-06 NOTE — Telephone Encounter (Signed)
Returned pt's call. She informed me that her BP has been elevated and she has been in bed

## 2019-02-06 NOTE — Telephone Encounter (Addendum)
Followed up with patient. Patient reports recent issues w/ BPs and wants to make sure we have gotten that update from PCP.  Informed pt that we can see changes in medication list so we will know which medications she is taking. Patient continues to talk about her BPs and I had to re-focus her several times about discussing her SVT as PCP was advising on BPs.   Pt does state that Lopressor isn't holding her HRs, it usually wears off several hours before her next dose it due.  Pt offered appt in [place] office, sooner than [facility] OV scheduled for 8/10 -- pt going to discuss w/ husband d/t transportation issues.  Pt will call back and schedule OV for July in [place] if she can make arrnagements.  She appreciates my call.

## 2019-02-06 NOTE — Telephone Encounter (Signed)
Patient would like to speak to you for a moment about not feeling well, was in ED yesterday. States she doesn't know if she needs to increase her meds. Offered to transfer to triage but did not want to speak to triage and offered a virtual appointment with Dr.Metheney this afternoon and patient declined. Please advise.

## 2019-02-07 ENCOUNTER — Encounter: Payer: Self-pay | Admitting: Family Medicine

## 2019-02-07 ENCOUNTER — Ambulatory Visit (INDEPENDENT_AMBULATORY_CARE_PROVIDER_SITE_OTHER): Payer: Medicare HMO | Admitting: Family Medicine

## 2019-02-07 VITALS — BP 151/92

## 2019-02-07 DIAGNOSIS — S61439A Puncture wound without foreign body of unspecified hand, initial encounter: Secondary | ICD-10-CM

## 2019-02-07 MED ORDER — MUPIROCIN 2 % EX OINT: TOPICAL_OINTMENT | Freq: Two times a day (BID) | CUTANEOUS | 0 refills | Status: DC

## 2019-02-07 NOTE — Telephone Encounter (Signed)
Left pt msg advising of providers recommendations

## 2019-02-07 NOTE — Telephone Encounter (Signed)
I cannot remember if she actually tried the verapamil or not.  But I would like for her to try it.  She can even just start with a half a tab twice a day in addition to her metoprolol and her spironolactone.

## 2019-02-07 NOTE — Progress Notes (Signed)
Virtual Visit via Video Note  I connected with Jennifer [name] on 02/07/19 at  4:20 PM EDT by a video enabled telemedicine application and verified that I am speaking with the correct person using two identifiers.   I discussed the limitations of evaluation and management by telemedicine and the availability of in person appointments. The patient expressed understanding and agreed to proceed.  Pt was at home and I was in my office for the virtual visit.     Subjective:    CC: Hit back of hand on cactus.  HPI:  She hit the back of her hand on her cactus while trying to lower the blinds. She says it hit the vein.  Says the vein in the back of her hand has been swollen. She has been icing it.  She says it really did not bleed significantly but it is just very swollen and painful.  She is very concerned about the potential for infection.  Hypertension-she also reports that her blood pressure has still been running high.  She said she actually cut back on her metoprolol because she feels like when she takes it it lowers her heart rate which then actually causes her blood pressure to elevate.  She has been taking her Spironolactone some.  He said she did reach out to the cardiologist.  Past medical history, Surgical history, Family history not pertinant except as noted below, Social history, Allergies, and medications have been entered into the medical record, reviewed, and corrections made.   Review of Systems: No fevers, chills, night sweats, weight loss, chest pain, or shortness of breath.   Objective:    General: Speaking clearly in complete sentences without any shortness of breath.  Alert and oriented x3.  Normal judgment. No apparent acute distress.  Dorsum of hand looks erythematous and mildly swollen.    Impression and Recommendations:    Puncture wound from a cactus needle-she is pretty sure that there is no remnant of the needle in her skin these were very large wide flat  needles.  Evidently it did hit the vein and so the vein is bulging.  Recommend that she compress it with an Ace wrap and apply some triple antibiotic ointment that she Artie has at home.  I did send over prescription for mupirocin ointment to pick up if she would like.  If she notices any red streaking down the wrist or fingers then please call back we do have an on-call nurse over the weekend.  Recommend continue to ice periodically.    Hypertension-not well controlled.  Will discuss further at her appointment on Monday.  We will see what cardiology has recommended.  I believe she has an appointment in August with them.  I discussed the assessment and treatment plan with the patient. The patient was provided an opportunity to ask questions and all were answered. The patient agreed with the plan and demonstrated an understanding of the instructions.   The patient was advised to call back or seek an in-person evaluation if the symptoms worsen or if the condition fails to improve as anticipated.   Beatrice Lecher, MD

## 2019-02-10 ENCOUNTER — Encounter: Payer: Self-pay | Admitting: Family Medicine

## 2019-02-10 ENCOUNTER — Ambulatory Visit (INDEPENDENT_AMBULATORY_CARE_PROVIDER_SITE_OTHER): Payer: Medicare HMO | Admitting: Family Medicine

## 2019-02-10 VITALS — BP 151/84 | HR 94 | Ht 62.0 in

## 2019-02-10 DIAGNOSIS — R0981 Nasal congestion: Secondary | ICD-10-CM

## 2019-02-10 DIAGNOSIS — S61439A Puncture wound without foreign body of unspecified hand, initial encounter: Secondary | ICD-10-CM | POA: Diagnosis not present

## 2019-02-10 DIAGNOSIS — R002 Palpitations: Secondary | ICD-10-CM | POA: Diagnosis not present

## 2019-02-10 DIAGNOSIS — I10 Essential (primary) hypertension: Secondary | ICD-10-CM

## 2019-02-10 LAB — GLUCOSE, POCT (MANUAL RESULT ENTRY): POC Glucose: 163 mg/dl — AB (ref 70–99)
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
## 2019-02-10 NOTE — Assessment & Plan Note (Signed)
BP uncontrolled today. I would really like for her to try the verapmil and we discussed that today.

## 2019-02-11 ENCOUNTER — Telehealth: Payer: Self-pay | Admitting: Family Medicine

## 2019-02-11 ENCOUNTER — Encounter: Payer: Self-pay | Admitting: Family Medicine

## 2019-02-11 DIAGNOSIS — R0602 Shortness of breath: Secondary | ICD-10-CM | POA: Diagnosis not present

## 2019-02-11 DIAGNOSIS — R531 Weakness: Secondary | ICD-10-CM | POA: Diagnosis not present

## 2019-02-11 DIAGNOSIS — R079 Chest pain, unspecified: Secondary | ICD-10-CM | POA: Diagnosis not present

## 2019-02-11 DIAGNOSIS — R000 Tachycardia, unspecified: Secondary | ICD-10-CM | POA: Diagnosis not present

## 2019-02-11 DIAGNOSIS — R5383 Other fatigue: Secondary | ICD-10-CM | POA: Diagnosis not present

## 2019-02-11 DIAGNOSIS — R69 Illness, unspecified: Secondary | ICD-10-CM | POA: Diagnosis not present

## 2019-02-11 DIAGNOSIS — M545 Low back pain: Secondary | ICD-10-CM | POA: Diagnosis not present

## 2019-02-11 DIAGNOSIS — R51 Headache: Secondary | ICD-10-CM | POA: Diagnosis not present

## 2019-02-11 DIAGNOSIS — R002 Palpitations: Secondary | ICD-10-CM | POA: Diagnosis not present

## 2019-02-11 LAB — BASIC METABOLIC PANEL WITH GFR
BUN: 11 mg/dL (ref 7–25)
CO2: 23 mmol/L (ref 20–32)
Calcium: 8.8 mg/dL (ref 8.6–10.4)
Chloride: 107 mmol/L (ref 98–110)
Creat: 0.57 mg/dL (ref 0.50–1.05)
GFR, Est African American: 123 mL/min/{1.73_m2} (ref >=60)
GFR, Est Non African American: 106 mL/min/{1.73_m2} (ref >=60)
Glucose, Bld: 121 mg/dL — ABNORMAL HIGH (ref 65–99)
Potassium: 3.5 mmol/L (ref 3.5–5.3)
Sodium: 142 mmol/L (ref 135–146)

## 2019-02-11 LAB — CBC WITH DIFFERENTIAL/PLATELET
Absolute Monocytes: 485 cells/uL (ref 200–950)
Basophils Absolute: 40 cells/uL (ref 0–200)
Basophils Relative: 0.7 %
Eosinophils Absolute: 154 cells/uL (ref 15–500)
Eosinophils Relative: 2.7 %
HCT: 40.8 % (ref 35.0–45.0)
Hemoglobin: 13.5 g/dL (ref 11.7–15.5)
Lymphs Abs: 1231 cells/uL (ref 850–3900)
MCH: 29.9 pg (ref 27.0–33.0)
MCHC: 33.1 g/dL (ref 32.0–36.0)
MCV: 90.3 fL (ref 80.0–100.0)
MPV: 11.6 fL (ref 7.5–12.5)
Monocytes Relative: 8.5 %
Neutro Abs: 3791 cells/uL (ref 1500–7800)
Neutrophils Relative %: 66.5 %
Platelets: 176 10*3/uL (ref 140–400)
RBC: 4.52 10*6/uL (ref 3.80–5.10)
RDW: 13.5 % (ref 11.0–15.0)
Total Lymphocyte: 21.6 %
WBC: 5.7 10*3/uL (ref 3.8–10.8)

## 2019-02-11 LAB — SPECIMEN COMPROMISED

## 2019-02-11 NOTE — Telephone Encounter (Signed)
Called and advised pt of nl labs. She reports that she still feels bad. She said that she still feels shaky and sweaty and her head still hurts.  Asked if she has been hydrating. She said as best she can. She said she feels that her heart is doing something and just isn't feeling great after leaving the office yesterday.   She reports that she experience some nausea this morning, and sweaty but not as shaky on the  Outside more on the inside. And she admits that she has been having more stressors than she has in the past.   She has an MRI tomorrow.   Will fwd to pcp as an fyi.Jennifer [name], [place]

## 2019-02-11 NOTE — Telephone Encounter (Signed)
Patient wanted to know if labs came in already. States that she is not feeling any better still. Has the shakes and chills. Would like to speak to [facility], did let her know that they were in clinic. Please advise.

## 2019-02-11 NOTE — Progress Notes (Signed)
All labs are normal. 

## 2019-02-12 DIAGNOSIS — Z0389 Encounter for observation for other suspected diseases and conditions ruled out: Secondary | ICD-10-CM | POA: Diagnosis not present

## 2019-02-12 DIAGNOSIS — R002 Palpitations: Secondary | ICD-10-CM | POA: Diagnosis not present

## 2019-02-12 DIAGNOSIS — M545 Low back pain: Secondary | ICD-10-CM | POA: Diagnosis not present

## 2019-02-12 DIAGNOSIS — R079 Chest pain, unspecified: Secondary | ICD-10-CM | POA: Diagnosis not present

## 2019-02-12 DIAGNOSIS — G35 Multiple sclerosis: Secondary | ICD-10-CM | POA: Diagnosis not present

## 2019-02-12 DIAGNOSIS — R5383 Other fatigue: Secondary | ICD-10-CM | POA: Diagnosis not present

## 2019-02-12 DIAGNOSIS — R531 Weakness: Secondary | ICD-10-CM | POA: Diagnosis not present

## 2019-02-12 DIAGNOSIS — R69 Illness, unspecified: Secondary | ICD-10-CM | POA: Diagnosis not present

## 2019-02-12 DIAGNOSIS — R000 Tachycardia, unspecified: Secondary | ICD-10-CM | POA: Diagnosis not present

## 2019-02-13 ENCOUNTER — Encounter: Payer: Self-pay | Admitting: Family Medicine

## 2019-02-13 ENCOUNTER — Ambulatory Visit (INDEPENDENT_AMBULATORY_CARE_PROVIDER_SITE_OTHER): Payer: Medicare HMO | Admitting: Family Medicine

## 2019-02-13 VITALS — BP 137/94 | HR 84 | Ht 62.0 in | Wt 225.0 lb

## 2019-02-13 DIAGNOSIS — R002 Palpitations: Secondary | ICD-10-CM | POA: Diagnosis not present

## 2019-02-13 DIAGNOSIS — F411 Generalized anxiety disorder: Secondary | ICD-10-CM | POA: Diagnosis not present

## 2019-02-13 DIAGNOSIS — R079 Chest pain, unspecified: Secondary | ICD-10-CM | POA: Diagnosis not present

## 2019-02-13 DIAGNOSIS — R69 Illness, unspecified: Secondary | ICD-10-CM | POA: Diagnosis not present

## 2019-02-13 DIAGNOSIS — I10 Essential (primary) hypertension: Secondary | ICD-10-CM | POA: Diagnosis not present

## 2019-02-13 DIAGNOSIS — R0602 Shortness of breath: Secondary | ICD-10-CM | POA: Diagnosis not present

## 2019-02-13 DIAGNOSIS — R635 Abnormal weight gain: Secondary | ICD-10-CM

## 2019-02-13 DIAGNOSIS — G8929 Other chronic pain: Secondary | ICD-10-CM | POA: Diagnosis not present

## 2019-02-13 NOTE — Progress Notes (Signed)
Virtual Visit via Video Note  I connected with Jennifer [name] on 02/13/19 at 11:30 AM EDT by a video enabled telemedicine application and verified that I am speaking with the correct person using two identifiers.   I discussed the limitations of evaluation and management by telemedicine and the availability of in person appointments. The patient expressed understanding and agreed to proceed.  Pt was at home and I was in my office for the virtual visit.     Subjective:    CC:   HPI: She had another episode of heart pounding and feeling SOB and went to the ED.  They are scheduling her with Cardiology. She just feel tired today. Says the hospital is placing an order for a stress test.  Activity seems ot aggravated her palpitations and that is keeping her from walking.  She is taking the spironolactone occasionally.  Not consistently but has been taking her metoprolol but she is still convinced of its worsening her tachycardia.  Still not actually taking the verapamil.  She has been gaining weight  Gradually over the last couple of years. She is really frustrated by that as well.   She feels really stressed and feels like her anxiety is really high.  Felt like having chest chest pressure and SOB and pain radiating into her shoulder.   Past medical history, Surgical history, Family history not pertinant except as noted below, Social history, Allergies, and medications have been entered into the medical record, reviewed, and corrections made.   Review of Systems: No fevers, chills, night sweats, weight loss, chest pain, or shortness of breath.   Objective:    General: Speaking clearly in complete sentences without any shortness of breath.  Alert and oriented x3.  Normal judgment. No apparent acute distress. Well controlled.     Impression and Recommendations:    Palpitations - hopefully will have stress test scheduled.  She has cardiology appt scheduled for next week. She wants something  closer to home. She is feeling unsafe to drive to [place].   Abnormal weight gain  - discussed working on food choices and staying active even if can't go to the gym.   Anxiety - Discussed possible referral to therapist or counselor.  I really want her to think about this I think it can be really helpful for her in the long-term.  Time spent 35 minutes in non-face-to-face encounter.   I discussed the assessment and treatment plan with the patient. The patient was provided an opportunity to ask questions and all were answered. The patient agreed with the plan and demonstrated an understanding of the instructions.   The patient was advised to call back or seek an in-person evaluation if the symptoms worsen or if the condition fails to improve as anticipated.   Beatrice Lecher, MD

## 2019-02-15 ENCOUNTER — Emergency Department (HOSPITAL_BASED_OUTPATIENT_CLINIC_OR_DEPARTMENT_OTHER)
Admission: EM | Admit: 2019-02-15 | Discharge: 2019-02-15 | Disposition: A | Discharge status: Home / Self Care | Payer: Medicare HMO | Attending: Emergency Medicine | Admitting: Emergency Medicine

## 2019-02-15 ENCOUNTER — Encounter (HOSPITAL_BASED_OUTPATIENT_CLINIC_OR_DEPARTMENT_OTHER): Payer: Self-pay | Admitting: Emergency Medicine

## 2019-02-15 ENCOUNTER — Other Ambulatory Visit: Payer: Self-pay

## 2019-02-15 DIAGNOSIS — J45909 Unspecified asthma, uncomplicated: Secondary | ICD-10-CM | POA: Insufficient documentation

## 2019-02-15 DIAGNOSIS — R69 Illness, unspecified: Secondary | ICD-10-CM | POA: Diagnosis not present

## 2019-02-15 DIAGNOSIS — F419 Anxiety disorder, unspecified: Secondary | ICD-10-CM | POA: Diagnosis not present

## 2019-02-15 DIAGNOSIS — I10 Essential (primary) hypertension: Secondary | ICD-10-CM | POA: Diagnosis not present

## 2019-02-15 DIAGNOSIS — Z87891 Personal history of nicotine dependence: Secondary | ICD-10-CM | POA: Insufficient documentation

## 2019-02-15 DIAGNOSIS — R0789 Other chest pain: Secondary | ICD-10-CM | POA: Diagnosis not present

## 2019-02-15 DIAGNOSIS — Z79899 Other long term (current) drug therapy: Secondary | ICD-10-CM | POA: Insufficient documentation

## 2019-02-15 DIAGNOSIS — R002 Palpitations: Secondary | ICD-10-CM | POA: Diagnosis not present

## 2019-02-15 DIAGNOSIS — R079 Chest pain, unspecified: Secondary | ICD-10-CM | POA: Diagnosis present

## 2019-02-15 NOTE — ED Notes (Signed)
EKG given to Dr. Rex Kras to review
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
## 2019-02-15 NOTE — ED Triage Notes (Signed)
Reports feeling lightheaded and woozy.  Reports she feels like her heart is "flopping".  Endorses intermittent cp and jaw pain with left arm pain.

## 2019-02-18 ENCOUNTER — Ambulatory Visit: Payer: Medicare HMO | Admitting: Psychology

## 2019-02-19 DIAGNOSIS — R002 Palpitations: Secondary | ICD-10-CM | POA: Diagnosis not present

## 2019-02-19 DIAGNOSIS — R079 Chest pain, unspecified: Secondary | ICD-10-CM | POA: Diagnosis not present

## 2019-02-19 DIAGNOSIS — R000 Tachycardia, unspecified: Secondary | ICD-10-CM | POA: Diagnosis not present

## 2019-02-19 DIAGNOSIS — R0981 Nasal congestion: Secondary | ICD-10-CM | POA: Diagnosis not present

## 2019-02-19 LAB — BASIC METABOLIC PANEL
BUN: 10 (ref 4–21)
Creatinine: 0.6 (ref 0.5–1.1)
Glucose: 140
Potassium: 3.8 (ref 3.4–5.3)
Sodium: 139 (ref 137–147)

## 2019-02-20 DIAGNOSIS — E785 Hyperlipidemia, unspecified: Secondary | ICD-10-CM | POA: Diagnosis not present

## 2019-02-20 DIAGNOSIS — R0789 Other chest pain: Secondary | ICD-10-CM | POA: Diagnosis not present

## 2019-02-20 DIAGNOSIS — R002 Palpitations: Secondary | ICD-10-CM | POA: Diagnosis not present

## 2019-02-20 DIAGNOSIS — I10 Essential (primary) hypertension: Secondary | ICD-10-CM | POA: Diagnosis not present

## 2019-02-21 ENCOUNTER — Encounter: Payer: Self-pay | Admitting: Family Medicine

## 2019-02-21 NOTE — Addendum Note (Signed)
Addended by: Clemetine Marker A on: 02/21/2019 08:41 AM   Modules accepted: Orders

## 2019-02-24 DIAGNOSIS — R1110 Vomiting, unspecified: Secondary | ICD-10-CM | POA: Diagnosis not present

## 2019-02-24 DIAGNOSIS — Z87891 Personal history of nicotine dependence: Secondary | ICD-10-CM | POA: Diagnosis not present

## 2019-02-24 DIAGNOSIS — R5383 Other fatigue: Secondary | ICD-10-CM | POA: Diagnosis not present

## 2019-02-24 DIAGNOSIS — R0602 Shortness of breath: Secondary | ICD-10-CM | POA: Diagnosis not present

## 2019-02-24 DIAGNOSIS — J029 Acute pharyngitis, unspecified: Secondary | ICD-10-CM | POA: Diagnosis not present

## 2019-02-25 ENCOUNTER — Telehealth: Payer: Self-pay | Admitting: Family Medicine

## 2019-02-25 ENCOUNTER — Encounter: Payer: Self-pay | Admitting: Family Medicine

## 2019-02-25 ENCOUNTER — Ambulatory Visit: Payer: Medicare HMO | Admitting: Family Medicine

## 2019-02-25 ENCOUNTER — Encounter (HOSPITAL_BASED_OUTPATIENT_CLINIC_OR_DEPARTMENT_OTHER): Payer: Self-pay | Admitting: *Deleted

## 2019-02-25 ENCOUNTER — Other Ambulatory Visit: Payer: Self-pay

## 2019-02-25 ENCOUNTER — Other Ambulatory Visit: Payer: Self-pay | Admitting: Family Medicine

## 2019-02-25 ENCOUNTER — Emergency Department (HOSPITAL_BASED_OUTPATIENT_CLINIC_OR_DEPARTMENT_OTHER)
Admission: EM | Admit: 2019-02-25 | Discharge: 2019-02-25 | Disposition: A | Discharge status: Home / Self Care | Payer: Medicare HMO | Attending: Emergency Medicine | Admitting: Emergency Medicine

## 2019-02-25 DIAGNOSIS — J029 Acute pharyngitis, unspecified: Secondary | ICD-10-CM | POA: Diagnosis not present

## 2019-02-25 DIAGNOSIS — R5383 Other fatigue: Secondary | ICD-10-CM | POA: Diagnosis not present

## 2019-02-25 DIAGNOSIS — I10 Essential (primary) hypertension: Secondary | ICD-10-CM

## 2019-02-25 DIAGNOSIS — Z886 Allergy status to analgesic agent status: Secondary | ICD-10-CM | POA: Diagnosis not present

## 2019-02-25 DIAGNOSIS — Z87891 Personal history of nicotine dependence: Secondary | ICD-10-CM | POA: Diagnosis not present

## 2019-02-25 DIAGNOSIS — J45909 Unspecified asthma, uncomplicated: Secondary | ICD-10-CM | POA: Insufficient documentation

## 2019-02-25 DIAGNOSIS — Z885 Allergy status to narcotic agent status: Secondary | ICD-10-CM | POA: Diagnosis not present

## 2019-02-25 DIAGNOSIS — R1110 Vomiting, unspecified: Secondary | ICD-10-CM | POA: Diagnosis not present

## 2019-02-25 DIAGNOSIS — R0602 Shortness of breath: Secondary | ICD-10-CM | POA: Diagnosis not present

## 2019-02-25 DIAGNOSIS — K219 Gastro-esophageal reflux disease without esophagitis: Secondary | ICD-10-CM | POA: Diagnosis not present

## 2019-02-25 DIAGNOSIS — Z79899 Other long term (current) drug therapy: Secondary | ICD-10-CM | POA: Diagnosis not present

## 2019-02-25 DIAGNOSIS — E785 Hyperlipidemia, unspecified: Secondary | ICD-10-CM | POA: Diagnosis not present

## 2019-02-25 DIAGNOSIS — E119 Type 2 diabetes mellitus without complications: Secondary | ICD-10-CM | POA: Diagnosis not present

## 2019-02-25 DIAGNOSIS — Z888 Allergy status to other drugs, medicaments and biological substances status: Secondary | ICD-10-CM | POA: Diagnosis not present

## 2019-02-25 DIAGNOSIS — R69 Illness, unspecified: Secondary | ICD-10-CM | POA: Diagnosis not present

## 2019-02-25 DIAGNOSIS — Z91010 Allergy to peanuts: Secondary | ICD-10-CM | POA: Diagnosis not present

## 2019-02-25 DIAGNOSIS — Z91018 Allergy to other foods: Secondary | ICD-10-CM | POA: Diagnosis not present

## 2019-02-25 MED ORDER — ACETAMINOPHEN 325 MG PO TABS
650.0000 mg | ORAL_TABLET | Freq: Once | ORAL | Status: AC
  Administered 2019-02-25: 650 mg via ORAL
  Filled 2019-02-25: qty 2

## 2019-02-25 MED ORDER — LABETALOL HCL 100 MG PO TABS: 100.0000 mg | ORAL_TABLET | Freq: Two times a day (BID) | ORAL | 0 refills | Status: DC

## 2019-02-25 NOTE — ED Triage Notes (Signed)
States her BP is elevated. She just took her new medication before coming inside the ER.
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
## 2019-02-25 NOTE — Telephone Encounter (Signed)
Error

## 2019-02-25 NOTE — Telephone Encounter (Signed)
Patient wanted to know about her referral for the hypertension clinic in [place]. Wanted to know the status of that. Please advise.

## 2019-02-25 NOTE — Telephone Encounter (Signed)
I don't know anything about that.  It is iin another phone note?

## 2019-02-25 NOTE — Telephone Encounter (Addendum)
Zalma's blood pressure is up. 228/135. She states she took half of Metoprolol 50 mg this morning. She wants to go off the Metoprolol because she states it is no longer working. She would like to go back on Labetalol. She would like it sent to [facility].

## 2019-02-25 NOTE — Discharge Instructions (Addendum)
Please take your medication as prescribed  Follow up with your PCP as scheduled on Thursday

## 2019-02-25 NOTE — ED Notes (Signed)
Pt. In no distress and B/P under control

## 2019-02-27 ENCOUNTER — Ambulatory Visit (INDEPENDENT_AMBULATORY_CARE_PROVIDER_SITE_OTHER): Payer: Medicare HMO | Admitting: Family Medicine

## 2019-02-27 ENCOUNTER — Encounter: Payer: Self-pay | Admitting: Family Medicine

## 2019-02-27 ENCOUNTER — Encounter: Payer: Medicare HMO | Admitting: Family Medicine

## 2019-02-27 DIAGNOSIS — F411 Generalized anxiety disorder: Secondary | ICD-10-CM | POA: Diagnosis not present

## 2019-02-27 DIAGNOSIS — I10 Essential (primary) hypertension: Secondary | ICD-10-CM | POA: Diagnosis not present

## 2019-02-27 DIAGNOSIS — S6990XA Unspecified injury of unspecified wrist, hand and finger(s), initial encounter: Secondary | ICD-10-CM | POA: Diagnosis not present

## 2019-02-27 DIAGNOSIS — R002 Palpitations: Secondary | ICD-10-CM

## 2019-02-27 DIAGNOSIS — R69 Illness, unspecified: Secondary | ICD-10-CM | POA: Diagnosis not present

## 2019-02-27 DIAGNOSIS — R251 Tremor, unspecified: Secondary | ICD-10-CM | POA: Diagnosis not present

## 2019-02-27 NOTE — Progress Notes (Signed)
Virtual Visit via Video Note  I connected with Jennifer [name] on 02/27/19 at 11:30 AM EDT by a video enabled telemedicine application and verified that I am speaking with the correct person using two identifiers.   I discussed the limitations of evaluation and management by telemedicine and the availability of in person appointments. The patient expressed understanding and agreed to proceed.  Subjective:    CC:   HPI:  Missed her [place] appt bc she couldn't get the forms filled out.  She is still having palpitations.  Her potassium was a little low a couple of days.    She called a couple of days ago and her BP was high. She wanted to change back to labetolol instead of metoprolol  Did see new Cardiologist. Felt like she wasn't heard.  Did have a nuclear stress test done 02/19/2019 and it was normal.  Normal reversible ischemia and normal Left ventricular wall motion.  Seeing EP doc on Monday.   Middle of back has been hurting almost daily.  It seem to be worse with sitting.  Feels like radiating upwards and seem to trigger x-rays.  She is concerned that her xray shows some degenerative spurring.  Has been having a hard time getting up and out of bed at time. She has been getting some tremors and shakiness.    Says still has a bad HA today and doesn't feel well.    She hit her thumb with a meat tenderizing. Wants to know when last tdap. She says it did bleed.     2020 July XR Chest Ap Portable7/14/2020   [facility] Result Impression  IMPRESSION: No acute pulmonary abnormality.  Electronically Signed by: Sol Passer  Result Narrative  TECHNIQUE: XR CHEST AP PORTABLE  INDICATION: Shortness of breath  COMPARISON: 01/14/2019  FINDINGS: Stable cardiac and mediastinal contours. No pleural effusion or pneumothorax. The lungs are clear. Remote right posterior sixth rib fracture. Degenerative spurring in the thoracic spine.   Other Result Information  Acute Interface, Incoming  Rad Results - 02/25/2019  8:56 AM EDT TECHNIQUE: XR CHEST AP PORTABLE  INDICATION: Shortness of breath  COMPARISON: 01/14/2019  FINDINGS: Stable cardiac and mediastinal contours. No pleural effusion or pneumothorax. The lungs are clear. Remote right posterior sixth rib fracture. Degenerative spurring in the thoracic spine.  IMPRESSION: No acute pulmonary abnormality.     Past medical history, Surgical history, Family history not pertinant except as noted below, Social history, Allergies, and medications have been entered into the medical record, reviewed, and corrections made.   Review of Systems: No fevers, chills, night sweats, weight loss, chest pain, or shortness of breath.   Objective:    General: Speaking clearly in complete sentences without any shortness of breath.  Alert and oriented x3.  Normal judgment. No apparent acute distress. Well groomed but tearful.     Impression and Recommendations:    Palpitations - Seeing EP doc on Monday. Recent negative stress test though she is worried about the T-waves.     Low back pain -  She does have some arthritis and spurring. Will mail copey of low back exercises.  Also discussed referral to PT but she wanted to hold off for right now.  Tremor - comes and goes. Mostly in hands. likely essential tremor.  Speak with Neurology about this.   Frequent Headaches -may be related to recently uncontrolled blood pressure would get her blood pressure down we will see if this improves.  Plus being on  a beta-blocker should help as well.  HTN - BP OK. Systolic is OK but diastolic is still high. She is back on labetalol. She is ok to take extra half a tab mid day if needed.  I discussed that to better control her tachycardia and blood pressure we may need to adjust her dose.  For now she will stay with current dose and will follow-up in 1 week.  She would also like to be referred to a hypertension clinic if at all possible.  Depression - she  didn't make appt bc couldn't get the forms to download. Will try to get forms mailed to her and and reschedule.  I think it is highly important that we get her an appointment with 1 of our behavioral health specialists.  Been ongoing issues for years with depression and anxiety.  Thumb injury-tetanus shot is up-to-date.  Gave her warning signs and symptoms for when she needs to be evaluated.  Otherwise just continue to keep the wound clean and monitor for infection  I discussed the assessment and treatment plan with the patient. The patient was provided an opportunity to ask questions and all were answered. The patient agreed with the plan and demonstrated an understanding of the instructions.   The patient was advised to call back or seek an in-person evaluation if the symptoms worsen or if the condition fails to improve as anticipated.  Time spent 40 minutes, greater than 50% time spent in non-face-to-face encounter counseling about low back pain, tremor, headaches, hypertension, depression, and palpitations.  Beatrice Lecher, MD

## 2019-02-27 NOTE — Progress Notes (Signed)
Pt reports that she still isn't feeling to good these days. She has really had to depend on her husband more to assist her w/standing.   She is having bad pains that shoot up her spine and causes headaches. She has been having more back pains. Using heating pads for this.  She said that she continues to have the "shakes and tremors".   She has been to the cardiologist and felt like she didn't get anywhere. She asked about the Hypertension clinic I informed her when I looked this up to see if there is someone in [place] that she can go to I told her that I wasn't able to find any place in [place].  She still feels really uncomfortable with driving and has had to cancel appointments because of this  .[name] Hughes

## 2019-02-28 NOTE — Progress Notes (Signed)
.   new note started under same date.

## 2019-03-01 DIAGNOSIS — R072 Precordial pain: Secondary | ICD-10-CM | POA: Diagnosis not present

## 2019-03-01 DIAGNOSIS — R5383 Other fatigue: Secondary | ICD-10-CM | POA: Diagnosis not present

## 2019-03-01 DIAGNOSIS — R9431 Abnormal electrocardiogram [ECG] [EKG]: Secondary | ICD-10-CM | POA: Diagnosis not present

## 2019-03-01 DIAGNOSIS — R0602 Shortness of breath: Secondary | ICD-10-CM | POA: Diagnosis not present

## 2019-03-01 DIAGNOSIS — Z87891 Personal history of nicotine dependence: Secondary | ICD-10-CM | POA: Diagnosis not present

## 2019-03-02 DIAGNOSIS — R9431 Abnormal electrocardiogram [ECG] [EKG]: Secondary | ICD-10-CM | POA: Diagnosis not present

## 2019-03-03 ENCOUNTER — Emergency Department (HOSPITAL_BASED_OUTPATIENT_CLINIC_OR_DEPARTMENT_OTHER)
Admission: EM | Admit: 2019-03-03 | Discharge: 2019-03-03 | Disposition: A | Discharge status: Home / Self Care | Payer: Medicare HMO | Attending: Emergency Medicine | Admitting: Emergency Medicine

## 2019-03-03 ENCOUNTER — Ambulatory Visit: Payer: Self-pay

## 2019-03-03 ENCOUNTER — Encounter (HOSPITAL_BASED_OUTPATIENT_CLINIC_OR_DEPARTMENT_OTHER): Payer: Self-pay

## 2019-03-03 ENCOUNTER — Encounter: Payer: Self-pay | Admitting: Family Medicine

## 2019-03-03 ENCOUNTER — Telehealth: Payer: Self-pay

## 2019-03-03 ENCOUNTER — Ambulatory Visit: Payer: Medicare HMO | Admitting: Family Medicine

## 2019-03-03 ENCOUNTER — Other Ambulatory Visit: Payer: Self-pay

## 2019-03-03 VITALS — BP 169/104 | HR 81 | Ht 62.0 in | Wt 228.0 lb

## 2019-03-03 DIAGNOSIS — M79672 Pain in left foot: Secondary | ICD-10-CM | POA: Diagnosis not present

## 2019-03-03 DIAGNOSIS — Z87891 Personal history of nicotine dependence: Secondary | ICD-10-CM | POA: Diagnosis not present

## 2019-03-03 DIAGNOSIS — Z880 Allergy status to penicillin: Secondary | ICD-10-CM | POA: Diagnosis not present

## 2019-03-03 DIAGNOSIS — M79671 Pain in right foot: Secondary | ICD-10-CM

## 2019-03-03 DIAGNOSIS — G4733 Obstructive sleep apnea (adult) (pediatric): Secondary | ICD-10-CM | POA: Diagnosis not present

## 2019-03-03 DIAGNOSIS — I10 Essential (primary) hypertension: Secondary | ICD-10-CM

## 2019-03-03 DIAGNOSIS — R9431 Abnormal electrocardiogram [ECG] [EKG]: Secondary | ICD-10-CM | POA: Diagnosis not present

## 2019-03-03 DIAGNOSIS — G35 Multiple sclerosis: Secondary | ICD-10-CM | POA: Diagnosis not present

## 2019-03-03 DIAGNOSIS — Z79899 Other long term (current) drug therapy: Secondary | ICD-10-CM | POA: Diagnosis not present

## 2019-03-03 DIAGNOSIS — R002 Palpitations: Secondary | ICD-10-CM | POA: Diagnosis not present

## 2019-03-03 DIAGNOSIS — J4530 Mild persistent asthma, uncomplicated: Secondary | ICD-10-CM | POA: Diagnosis not present

## 2019-03-03 DIAGNOSIS — I471 Supraventricular tachycardia: Secondary | ICD-10-CM | POA: Diagnosis not present

## 2019-03-03 DIAGNOSIS — Z8541 Personal history of malignant neoplasm of cervix uteri: Secondary | ICD-10-CM | POA: Insufficient documentation

## 2019-03-03 LAB — COMPREHENSIVE METABOLIC PANEL
ALT: 25 U/L (ref 0–44)
AST: 18 U/L (ref 15–41)
Albumin: 3.9 g/dL (ref 3.5–5.0)
Alkaline Phosphatase: 62 U/L (ref 38–126)
Anion gap: 10 (ref 5–15)
BUN: 12 mg/dL (ref 6–20)
CO2: 23 mmol/L (ref 22–32)
Calcium: 9 mg/dL (ref 8.9–10.3)
Chloride: 107 mmol/L (ref 98–111)
Creatinine, Ser: 0.56 mg/dL (ref 0.44–1.00)
GFR calc Af Amer: >60 mL/min (ref >60)
GFR calc non Af Amer: >60 mL/min (ref >60)
Glucose, Bld: 103 mg/dL — ABNORMAL HIGH (ref 70–99)
Potassium: 3.6 mmol/L (ref 3.5–5.1)
Sodium: 140 mmol/L (ref 135–145)
Total Bilirubin: 0.7 mg/dL (ref 0.3–1.2)
Total Protein: 6.9 g/dL (ref 6.5–8.1)

## 2019-03-03 LAB — CBC
HCT: 41.6 % (ref 36.0–46.0)
Hemoglobin: 13 g/dL (ref 12.0–15.0)
MCH: 28.9 pg (ref 26.0–34.0)
MCHC: 31.3 g/dL (ref 30.0–36.0)
MCV: 92.4 fL (ref 80.0–100.0)
Platelets: 209 10*3/uL (ref 150–400)
RBC: 4.5 MIL/uL (ref 3.87–5.11)
RDW: 13.7 % (ref 11.5–15.5)
WBC: 5.5 10*3/uL (ref 4.0–10.5)
nRBC: 0 % (ref 0.0–0.2)

## 2019-03-03 NOTE — Telephone Encounter (Signed)
Yes ok ot take half a tab mid-day

## 2019-03-03 NOTE — Telephone Encounter (Signed)
Jennifer [name] called and states her blood pressure this morning was 169/104. She wanted to know if she could take a half tablet around 1 pm.

## 2019-03-03 NOTE — Telephone Encounter (Signed)
Jennifer [name] advised of recommendations. She wants to know if she can take something different than Miralax. She states it doesn't help. She is wanting to take Magnesium Citrate.

## 2019-03-03 NOTE — ED Triage Notes (Signed)
Pt presented to reg requesting to have BP checked after talking with PCP office-advised the ED does not perform routine BP checks-pt signed in to be seen-py NAD-steady gait

## 2019-03-03 NOTE — Assessment & Plan Note (Signed)
Left foot pain is worse than the right.  No identified structure on ultrasound as the source.  Unlikely to be radicular nature.  Could be associated with the subtalar joint.  Possible to have complex regional pain syndrome with previous reported injuries.  Also could be associated with somatic or fibromyalgia. -Provided Pennsaid samples. -Counseled on home exercise therapy and supportive care. -Could consider an injection to the subtalar joint
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
## 2019-03-03 NOTE — Discharge Instructions (Addendum)
Continue with the recommendations given to you by your primary care doctor.
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
## 2019-03-03 NOTE — Telephone Encounter (Signed)
Patient advised.

## 2019-03-03 NOTE — Patient Instructions (Signed)
Nice to meet you Please try the rayos. You take two pills at night before bed.  Please try the pennsaid. You can try this after the rayos.  Please continue the massage and soaking the feet.   Please send me a message in MyChart with any questions or updates.  Please see me back in 4 weeks.   --Dr. Raeford Razor

## 2019-03-03 NOTE — Telephone Encounter (Signed)
Mag citrate is good to take Occ when needed but miralax is good to take every night for maintenance after she gets a good clean out.

## 2019-03-03 NOTE — Telephone Encounter (Signed)
Jennifer [name] states she would like something for depression. She states she has used Buspar in the past and it didn't give her headaches like Zoloft, Paxil and Prozac. She is also worried about any medications that could cause seizures.

## 2019-03-04 ENCOUNTER — Encounter: Payer: Self-pay | Admitting: Family Medicine

## 2019-03-05 ENCOUNTER — Other Ambulatory Visit: Payer: Self-pay

## 2019-03-05 ENCOUNTER — Encounter: Payer: Self-pay | Admitting: Family Medicine

## 2019-03-05 ENCOUNTER — Encounter (HOSPITAL_BASED_OUTPATIENT_CLINIC_OR_DEPARTMENT_OTHER): Payer: Self-pay | Admitting: Emergency Medicine

## 2019-03-05 ENCOUNTER — Emergency Department (HOSPITAL_BASED_OUTPATIENT_CLINIC_OR_DEPARTMENT_OTHER)
Admission: EM | Admit: 2019-03-05 | Discharge: 2019-03-05 | Disposition: A | Discharge status: Home / Self Care | Payer: Medicare HMO | Attending: Emergency Medicine | Admitting: Emergency Medicine

## 2019-03-05 ENCOUNTER — Emergency Department (HOSPITAL_BASED_OUTPATIENT_CLINIC_OR_DEPARTMENT_OTHER): Payer: Medicare HMO

## 2019-03-05 ENCOUNTER — Ambulatory Visit: Payer: Medicare HMO | Admitting: Family Medicine

## 2019-03-05 DIAGNOSIS — R1084 Generalized abdominal pain: Secondary | ICD-10-CM | POA: Diagnosis not present

## 2019-03-05 DIAGNOSIS — K5730 Diverticulosis of large intestine without perforation or abscess without bleeding: Secondary | ICD-10-CM | POA: Diagnosis not present

## 2019-03-05 DIAGNOSIS — I10 Essential (primary) hypertension: Secondary | ICD-10-CM | POA: Diagnosis not present

## 2019-03-05 DIAGNOSIS — Z87891 Personal history of nicotine dependence: Secondary | ICD-10-CM | POA: Diagnosis not present

## 2019-03-05 DIAGNOSIS — E785 Hyperlipidemia, unspecified: Secondary | ICD-10-CM | POA: Insufficient documentation

## 2019-03-05 DIAGNOSIS — Z79899 Other long term (current) drug therapy: Secondary | ICD-10-CM | POA: Diagnosis not present

## 2019-03-05 LAB — CBC WITH DIFFERENTIAL/PLATELET
Abs Immature Granulocytes: 0.02 10*3/uL (ref 0.00–0.07)
Basophils Absolute: 0 10*3/uL (ref 0.0–0.1)
Basophils Relative: 1 %
Eosinophils Absolute: 0.2 10*3/uL (ref 0.0–0.5)
Eosinophils Relative: 3 %
HCT: 42.3 % (ref 36.0–46.0)
Hemoglobin: 13.3 g/dL (ref 12.0–15.0)
Immature Granulocytes: 0 %
Lymphocytes Relative: 23 %
Lymphs Abs: 1.4 10*3/uL (ref 0.7–4.0)
MCH: 29 pg (ref 26.0–34.0)
MCHC: 31.4 g/dL (ref 30.0–36.0)
MCV: 92.2 fL (ref 80.0–100.0)
Monocytes Absolute: 0.5 10*3/uL (ref 0.1–1.0)
Monocytes Relative: 8 %
Neutro Abs: 3.9 10*3/uL (ref 1.7–7.7)
Neutrophils Relative %: 65 %
Platelets: 210 10*3/uL (ref 150–400)
RBC: 4.59 MIL/uL (ref 3.87–5.11)
RDW: 13.5 % (ref 11.5–15.5)
WBC: 6 10*3/uL (ref 4.0–10.5)
nRBC: 0 % (ref 0.0–0.2)

## 2019-03-05 LAB — URINALYSIS, ROUTINE W REFLEX MICROSCOPIC
Bilirubin Urine: NEGATIVE
Glucose, UA: NEGATIVE mg/dL
Hgb urine dipstick: NEGATIVE
Ketones, ur: NEGATIVE mg/dL
Leukocytes,Ua: NEGATIVE
Nitrite: NEGATIVE
Protein, ur: NEGATIVE mg/dL
Specific Gravity, Urine: 1.025 (ref 1.005–1.030)
pH: 6 (ref 5.0–8.0)

## 2019-03-05 LAB — COMPREHENSIVE METABOLIC PANEL
ALT: 28 U/L (ref 0–44)
AST: 19 U/L (ref 15–41)
Albumin: 3.8 g/dL (ref 3.5–5.0)
Alkaline Phosphatase: 67 U/L (ref 38–126)
Anion gap: 10 (ref 5–15)
BUN: 11 mg/dL (ref 6–20)
CO2: 24 mmol/L (ref 22–32)
Calcium: 9 mg/dL (ref 8.9–10.3)
Chloride: 107 mmol/L (ref 98–111)
Creatinine, Ser: 0.52 mg/dL (ref 0.44–1.00)
GFR calc Af Amer: >60 mL/min (ref >60)
GFR calc non Af Amer: >60 mL/min (ref >60)
Glucose, Bld: 121 mg/dL — ABNORMAL HIGH (ref 70–99)
Potassium: 3.4 mmol/L — ABNORMAL LOW (ref 3.5–5.1)
Sodium: 141 mmol/L (ref 135–145)
Total Bilirubin: 0.5 mg/dL (ref 0.3–1.2)
Total Protein: 6.7 g/dL (ref 6.5–8.1)

## 2019-03-05 LAB — LIPASE, BLOOD: Lipase: 36 U/L (ref 11–51)

## 2019-03-05 MED ORDER — BUSPIRONE HCL 10 MG PO TABS: 10.0000 mg | ORAL_TABLET | Freq: Three times a day (TID) | ORAL | 1 refills | Status: DC

## 2019-03-05 MED ORDER — LABETALOL HCL 100 MG PO TABS: 100.0000 mg | ORAL_TABLET | Freq: Two times a day (BID) | ORAL | 1 refills | Status: DC

## 2019-03-05 NOTE — Telephone Encounter (Signed)
Call pt:  The Buspar is really more for anxiety. Not really for depression so we can definitely use something specifically for depression.  Has tried Prozac, Paxil and zoloft. Will try lexapro if she is OK with this?   As far as the constipation I would repeat the mag citrate today and see if get a response from bowels in the next 24 hours.  I don't think we need an xray esp if she is having symptoms with the bloating, etc.

## 2019-03-05 NOTE — ED Triage Notes (Signed)
Pt states that over a week ago that she had a normal bowel movement. Tried Mag citrate and a fleets. Also states that she has been using a prescribe cream. Also endorsing lower abdominal pain. States she had a rectocele and not sure if that is acting up

## 2019-03-05 NOTE — Telephone Encounter (Signed)
OK, I will send over buspar.

## 2019-03-05 NOTE — Telephone Encounter (Addendum)
Pt advised and wants something that will help more so for her depression, she can deal with the anxiety. She wanted to know if this medication will help w/this.  She has taken Buspar in the past for anxiety. She does have an appt in the next month or so w/[facility].  Pt said that she drank the magnesium citrate and did a enema and she has not had a true BM. C/O nausea,abdomimal pressure/pain, no appetite. She feels like she has to go but can't.  She is afraid that she may be impacted and wanted to know if Dr. Madilyn Fireman can order a xray.   She hasn't had a good BM in over 1 week.Elouise Munroe, [place]
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
## 2019-03-05 NOTE — Discharge Instructions (Addendum)
Continue medications as previously prescribed.  Follow-up with your primary doctor if symptoms or not improving in the next 3 to 4 days.

## 2019-03-05 NOTE — ED Notes (Signed)
Patient transported to CT 

## 2019-03-06 DIAGNOSIS — R2681 Unsteadiness on feet: Secondary | ICD-10-CM | POA: Diagnosis not present

## 2019-03-06 DIAGNOSIS — E531 Pyridoxine deficiency: Secondary | ICD-10-CM | POA: Diagnosis not present

## 2019-03-06 DIAGNOSIS — R202 Paresthesia of skin: Secondary | ICD-10-CM | POA: Diagnosis not present

## 2019-03-06 DIAGNOSIS — E538 Deficiency of other specified B group vitamins: Secondary | ICD-10-CM | POA: Diagnosis not present

## 2019-03-06 DIAGNOSIS — R262 Difficulty in walking, not elsewhere classified: Secondary | ICD-10-CM | POA: Diagnosis not present

## 2019-03-06 DIAGNOSIS — M2550 Pain in unspecified joint: Secondary | ICD-10-CM | POA: Diagnosis not present

## 2019-03-06 DIAGNOSIS — R52 Pain, unspecified: Secondary | ICD-10-CM | POA: Diagnosis not present

## 2019-03-06 DIAGNOSIS — M791 Myalgia, unspecified site: Secondary | ICD-10-CM | POA: Diagnosis not present

## 2019-03-06 DIAGNOSIS — G35 Multiple sclerosis: Secondary | ICD-10-CM | POA: Diagnosis not present

## 2019-03-06 DIAGNOSIS — R5383 Other fatigue: Secondary | ICD-10-CM | POA: Diagnosis not present

## 2019-03-06 DIAGNOSIS — L905 Scar conditions and fibrosis of skin: Secondary | ICD-10-CM | POA: Diagnosis not present

## 2019-03-06 NOTE — Telephone Encounter (Signed)
Called patient and LM on VM to return call. KG LPN 

## 2019-03-06 NOTE — Telephone Encounter (Signed)
Spoke with patient and she is ok with starting the Lexapro, please send to pharmacy. KG LPN

## 2019-03-07 ENCOUNTER — Telehealth: Payer: Self-pay

## 2019-03-07 MED ORDER — LABETALOL HCL 100 MG PO TABS: 150.0000 mg | ORAL_TABLET | Freq: Two times a day (BID) | ORAL | 1 refills | Status: DC

## 2019-03-07 MED ORDER — ESCITALOPRAM OXALATE 5 MG PO TABS: 5.0000 mg | ORAL_TABLET | Freq: Every day | ORAL | 0 refills | Status: DC

## 2019-03-07 NOTE — Telephone Encounter (Signed)
Prescription sent for 5 mg Lexapro.  Will discontinue BuSpar off her med list.

## 2019-03-07 NOTE — Telephone Encounter (Signed)
Received call from [place] that patient's Labetalol directions need to be changed.   Per patient, she is taking 150 mg BID and 100 mg at midday  Discussed with Dr Madilyn Fireman and she has given OK to change RX  RX sent

## 2019-03-09 IMAGING — DX DG LUMBAR SPINE COMPLETE 4+V
5 series · 5 of 5 positions shown · non-contrast
Comparison: Abdomen and pelvis CT dated 10/07/2016.

CLINICAL DATA: Left low back pain following a fall on 12/07/2017.

EXAM:
LUMBAR SPINE - COMPLETE 4+ VIEW

[l-spine ap]
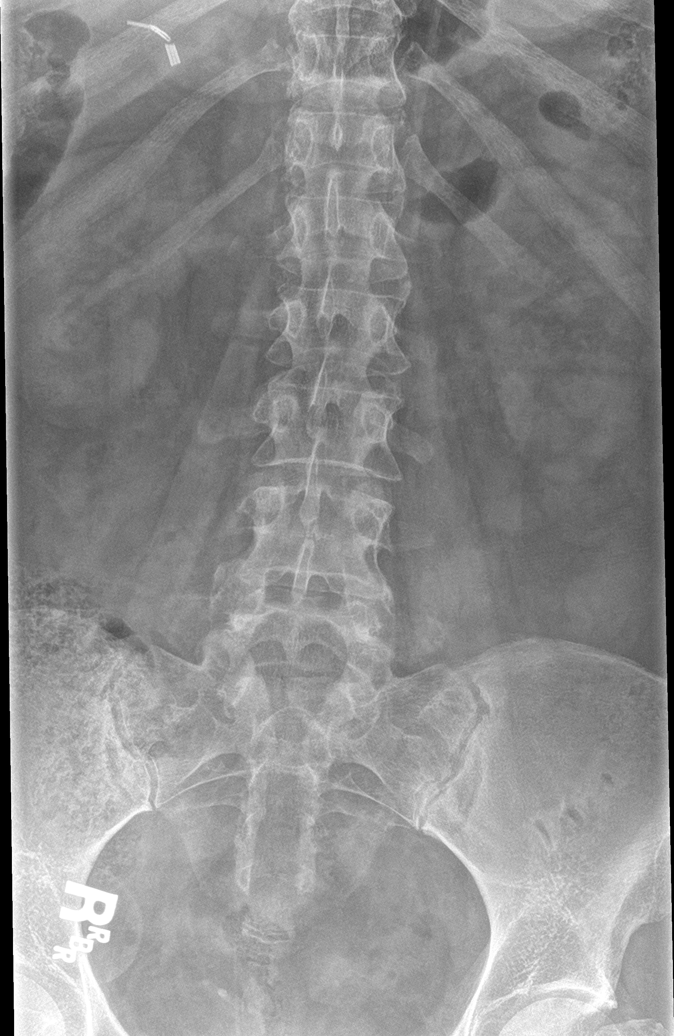

[l-spine obl (1 of 2)]
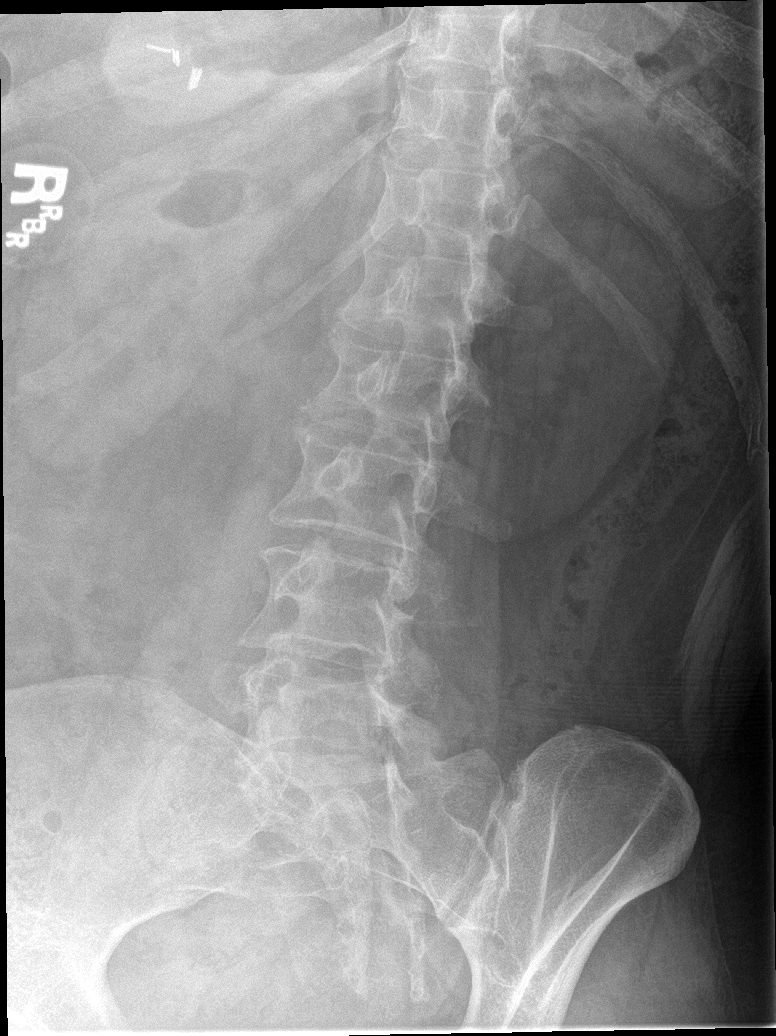

[l-spine obl (2 of 2)]
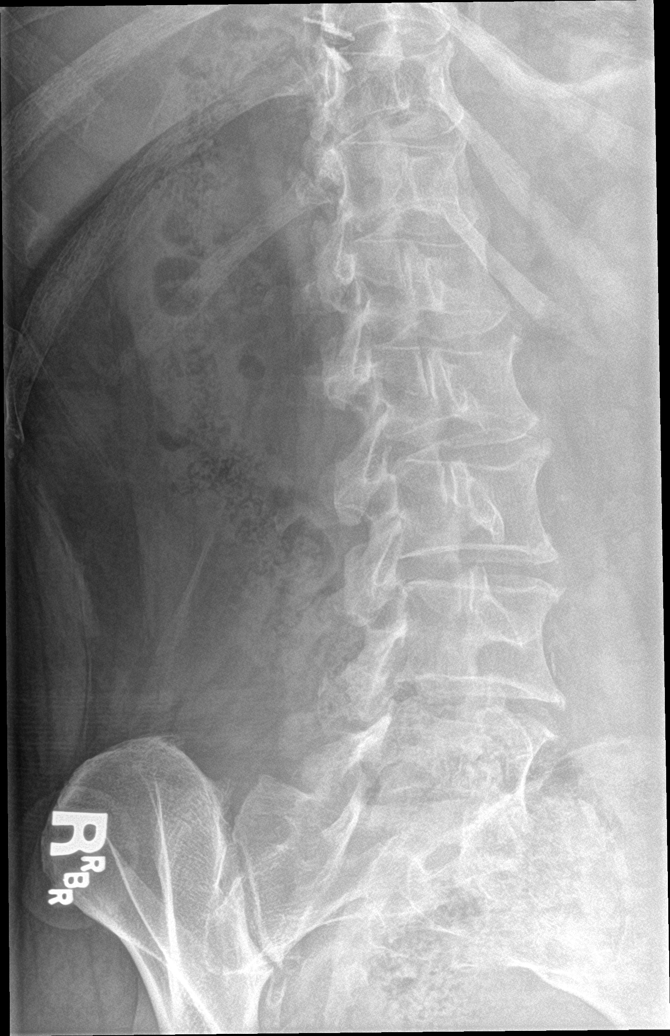

[l-spine lat]
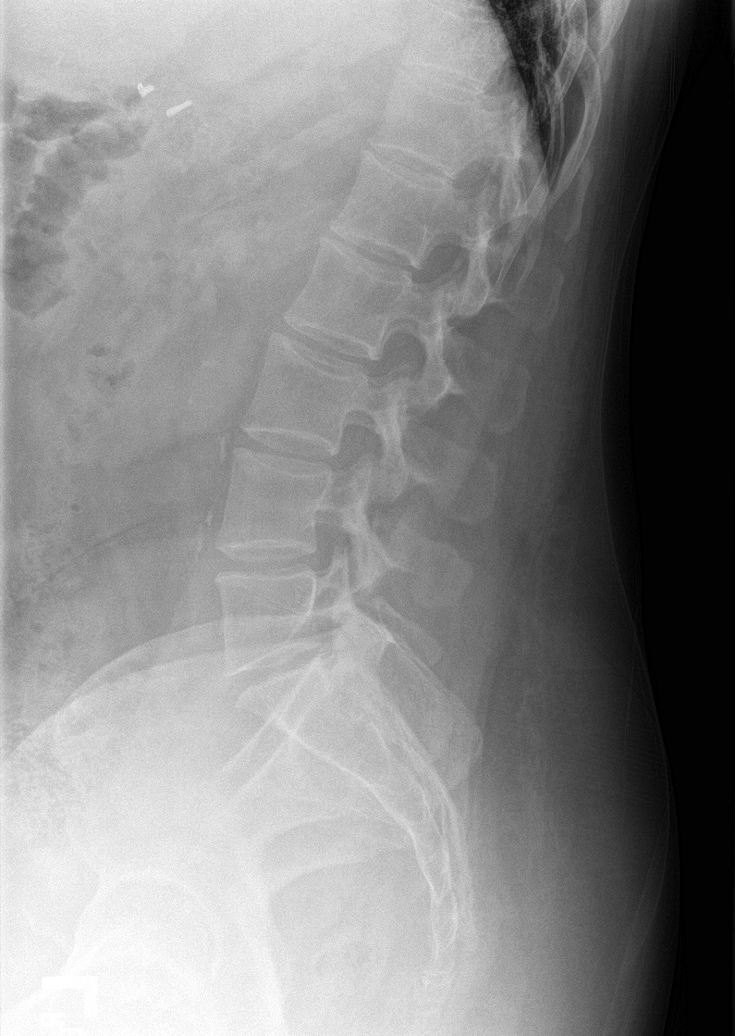

[l-spine spot]
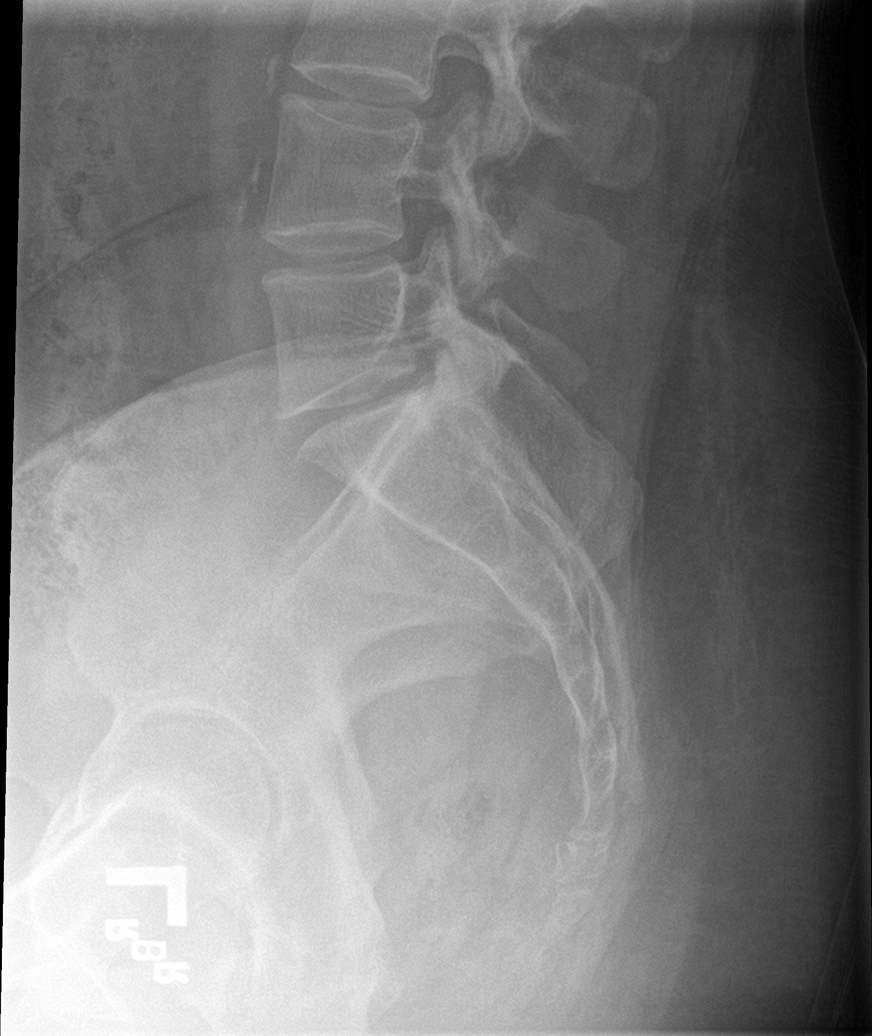

[5 of 5 positions shown; findings below may reference images not displayed]

FINDINGS: Five non-rib-bearing lumbar vertebrae. Is minimal levoconvex lumbar
rotary scoliosis. Mild anterolateral spur formation at multiple
levels. No fractures, pars defects or subluxations. Mild
atheromatous arterial calcifications. Cholecystectomy clips.
IMPRESSION: 1. No fracture or subluxation.
2. Mild degenerative changes.

## 2019-03-09 IMAGING — DX DG WRIST COMPLETE 3+V*L*
4 series · 4 of 4 positions shown · non-contrast
Comparison: 06/11/2016.

CLINICAL DATA: Left wrist pain in the region of the navicular
following a fall roller-skating 6 days ago.

EXAM:
LEFT WRIST - COMPLETE 3+ VIEW

[wrist pa]
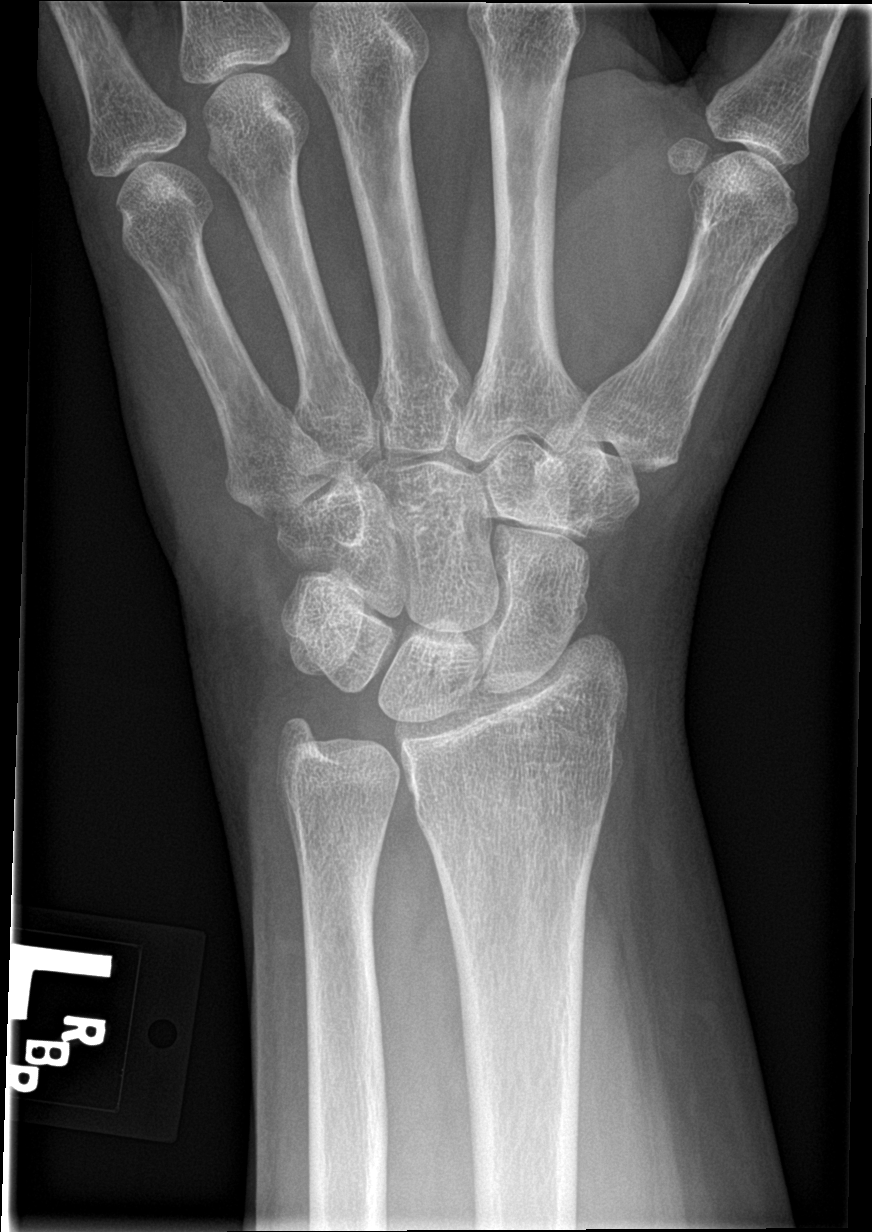

[wrist obl]
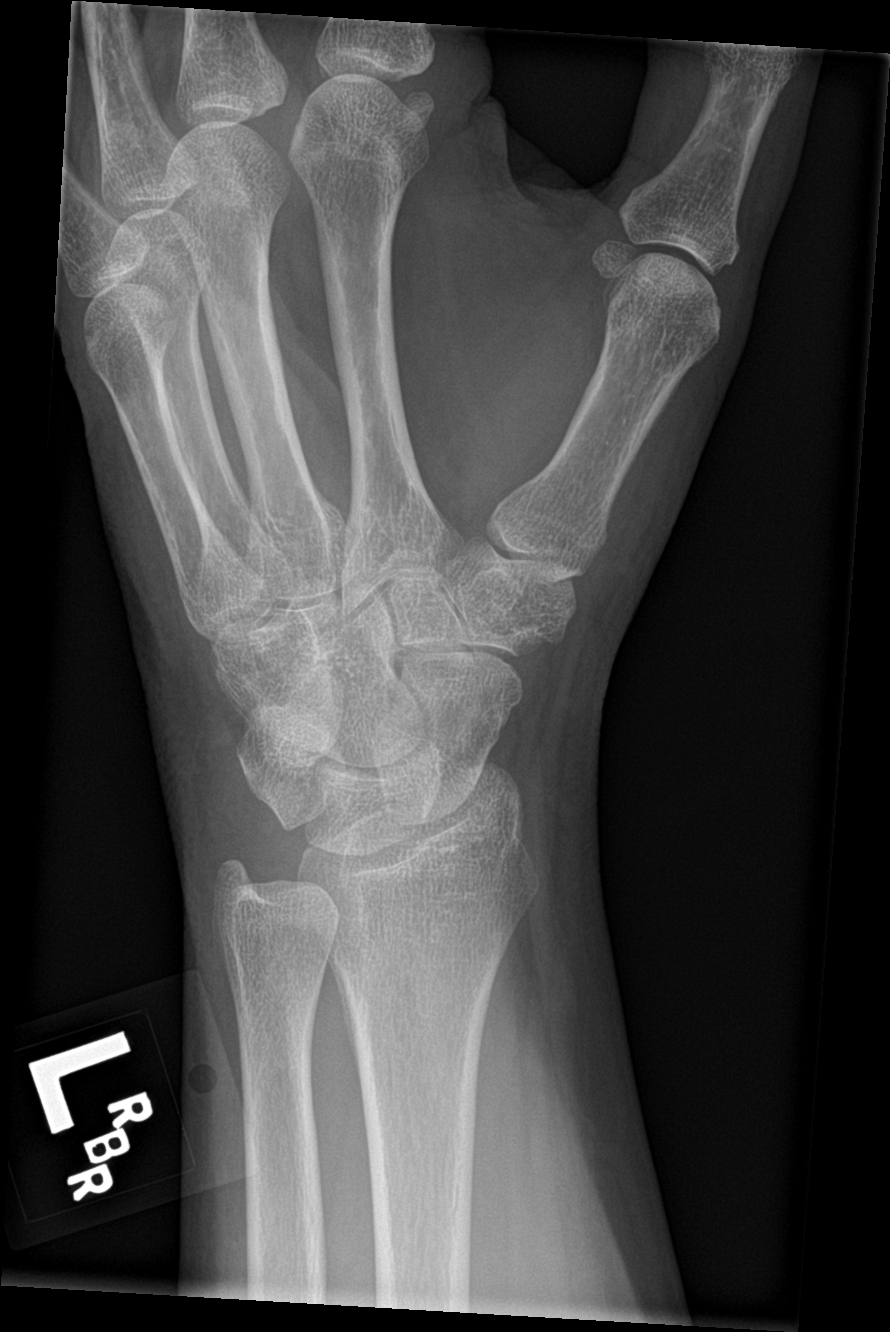

[wrist lat]
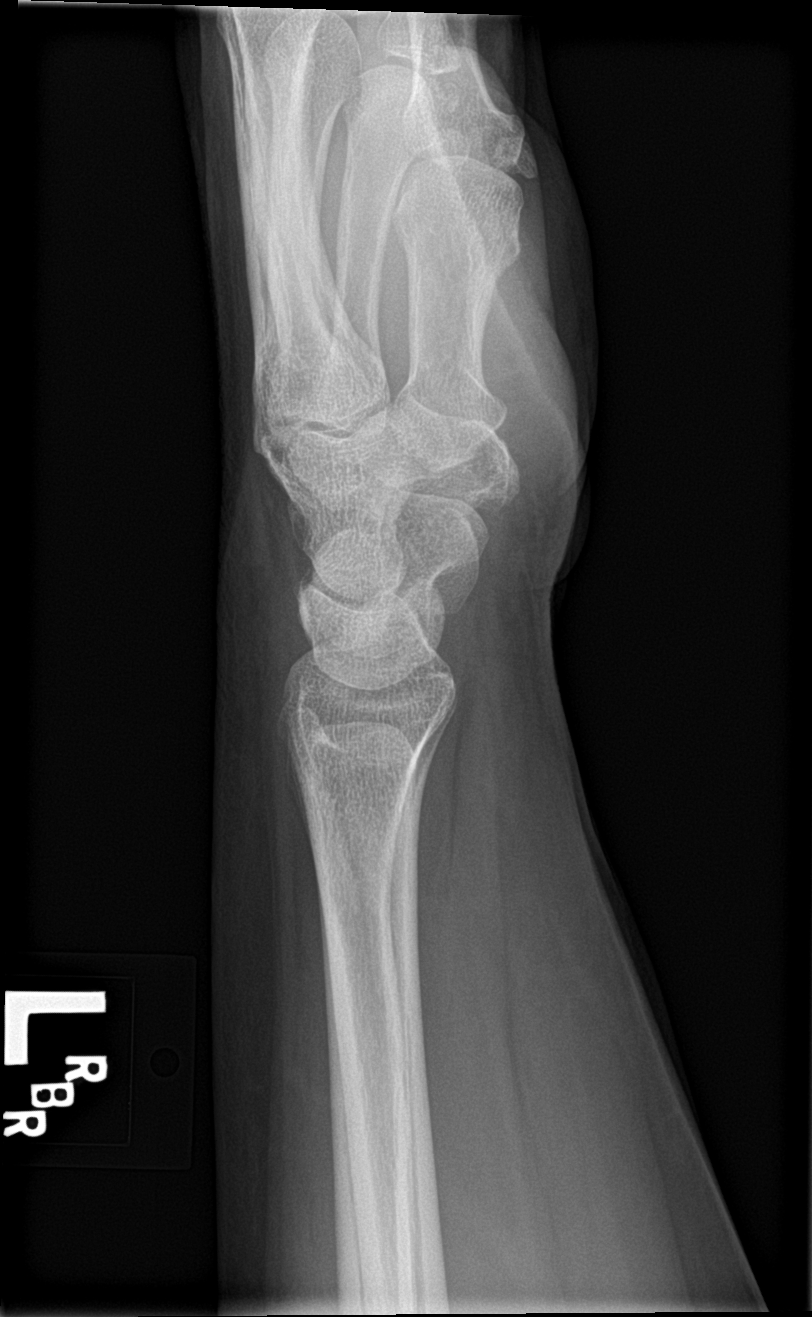

[wrist navicular]
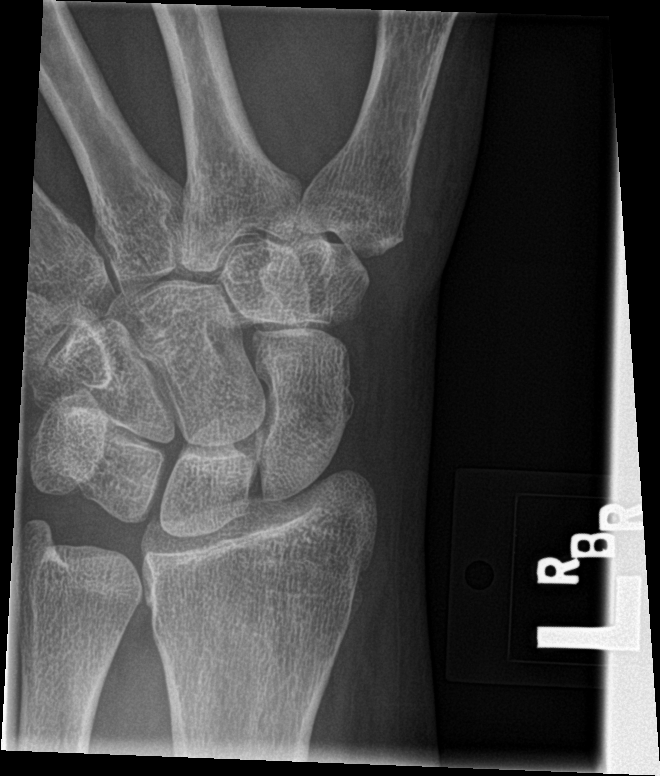

[4 of 4 positions shown; findings below may reference images not displayed]

FINDINGS: There is no evidence of fracture or dislocation. There is no
evidence of arthropathy or other focal bone abnormality. Soft
tissues are unremarkable.
IMPRESSION: Normal examination.

## 2019-03-10 ENCOUNTER — Encounter: Payer: Self-pay | Admitting: Family Medicine

## 2019-03-10 ENCOUNTER — Ambulatory Visit (INDEPENDENT_AMBULATORY_CARE_PROVIDER_SITE_OTHER): Payer: Medicare HMO | Admitting: Family Medicine

## 2019-03-10 VITALS — BP 127/84 | HR 91

## 2019-03-10 DIAGNOSIS — M79672 Pain in left foot: Secondary | ICD-10-CM

## 2019-03-10 DIAGNOSIS — M7751 Other enthesopathy of right foot: Secondary | ICD-10-CM | POA: Diagnosis not present

## 2019-03-10 DIAGNOSIS — F418 Other specified anxiety disorders: Secondary | ICD-10-CM | POA: Diagnosis not present

## 2019-03-10 DIAGNOSIS — M79671 Pain in right foot: Secondary | ICD-10-CM | POA: Diagnosis not present

## 2019-03-10 DIAGNOSIS — M722 Plantar fascial fibromatosis: Secondary | ICD-10-CM | POA: Diagnosis not present

## 2019-03-10 DIAGNOSIS — R002 Palpitations: Secondary | ICD-10-CM | POA: Diagnosis not present

## 2019-03-10 DIAGNOSIS — K5909 Other constipation: Secondary | ICD-10-CM | POA: Diagnosis not present

## 2019-03-10 DIAGNOSIS — I10 Essential (primary) hypertension: Secondary | ICD-10-CM | POA: Diagnosis not present

## 2019-03-10 DIAGNOSIS — M7752 Other enthesopathy of left foot: Secondary | ICD-10-CM | POA: Diagnosis not present

## 2019-03-10 DIAGNOSIS — R69 Illness, unspecified: Secondary | ICD-10-CM | POA: Diagnosis not present

## 2019-03-10 MED ORDER — LACTULOSE 10 GM/15ML PO SOLN: 20.0000 g | Freq: Three times a day (TID) | ORAL | 3 refills | Status: DC | PRN

## 2019-03-10 NOTE — Assessment & Plan Note (Signed)
Now on betablocker.

## 2019-03-10 NOTE — Assessment & Plan Note (Signed)
Will clal in lactulose

## 2019-03-10 NOTE — Assessment & Plan Note (Signed)
Has appt today with ortho for her feet.  Consider plantar fascitis vs neuropathy.

## 2019-03-10 NOTE — Assessment & Plan Note (Addendum)
Hasn't started Lexapro.  She plans to start it this weekend.  F/Uin 2 weeks. Has appt with counselor coming up soon. Strongly encouraged her to keep this appt. Her anxiety is magnifying her sxs.
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
## 2019-03-10 NOTE — Assessment & Plan Note (Signed)
BP looks great on labetolol.  Continue current regimen.

## 2019-03-10 NOTE — Progress Notes (Signed)
Pt p/u Escitalopram but she has not started this. She will start on August 1.Jennifer [name], Jennifer [name], CMA

## 2019-03-11 DIAGNOSIS — E876 Hypokalemia: Secondary | ICD-10-CM | POA: Diagnosis not present

## 2019-03-12 DIAGNOSIS — S63501A Unspecified sprain of right wrist, initial encounter: Secondary | ICD-10-CM | POA: Diagnosis not present

## 2019-03-12 DIAGNOSIS — W07XXXA Fall from chair, initial encounter: Secondary | ICD-10-CM | POA: Diagnosis not present

## 2019-03-12 DIAGNOSIS — R072 Precordial pain: Secondary | ICD-10-CM | POA: Diagnosis not present

## 2019-03-12 DIAGNOSIS — R69 Illness, unspecified: Secondary | ICD-10-CM | POA: Diagnosis not present

## 2019-03-12 DIAGNOSIS — R0602 Shortness of breath: Secondary | ICD-10-CM | POA: Diagnosis not present

## 2019-03-12 DIAGNOSIS — M545 Low back pain: Secondary | ICD-10-CM | POA: Diagnosis not present

## 2019-03-12 DIAGNOSIS — R251 Tremor, unspecified: Secondary | ICD-10-CM | POA: Diagnosis not present

## 2019-03-12 DIAGNOSIS — M79605 Pain in left leg: Secondary | ICD-10-CM | POA: Diagnosis not present

## 2019-03-12 DIAGNOSIS — M79606 Pain in leg, unspecified: Secondary | ICD-10-CM | POA: Diagnosis not present

## 2019-03-12 DIAGNOSIS — R531 Weakness: Secondary | ICD-10-CM | POA: Diagnosis not present

## 2019-03-12 DIAGNOSIS — Y999 Unspecified external cause status: Secondary | ICD-10-CM | POA: Diagnosis not present

## 2019-03-12 DIAGNOSIS — R079 Chest pain, unspecified: Secondary | ICD-10-CM | POA: Diagnosis not present

## 2019-03-12 DIAGNOSIS — J3489 Other specified disorders of nose and nasal sinuses: Secondary | ICD-10-CM | POA: Diagnosis not present

## 2019-03-13 DIAGNOSIS — R0602 Shortness of breath: Secondary | ICD-10-CM | POA: Diagnosis not present

## 2019-03-13 DIAGNOSIS — R079 Chest pain, unspecified: Secondary | ICD-10-CM | POA: Diagnosis not present

## 2019-03-13 DIAGNOSIS — Z87891 Personal history of nicotine dependence: Secondary | ICD-10-CM | POA: Diagnosis not present

## 2019-03-13 DIAGNOSIS — J3489 Other specified disorders of nose and nasal sinuses: Secondary | ICD-10-CM | POA: Diagnosis not present

## 2019-03-13 DIAGNOSIS — R251 Tremor, unspecified: Secondary | ICD-10-CM | POA: Diagnosis not present

## 2019-03-13 DIAGNOSIS — R072 Precordial pain: Secondary | ICD-10-CM | POA: Diagnosis not present

## 2019-03-13 DIAGNOSIS — R69 Illness, unspecified: Secondary | ICD-10-CM | POA: Diagnosis not present

## 2019-03-13 DIAGNOSIS — R531 Weakness: Secondary | ICD-10-CM | POA: Diagnosis not present

## 2019-03-15 DIAGNOSIS — I471 Supraventricular tachycardia: Secondary | ICD-10-CM | POA: Diagnosis not present

## 2019-03-15 DIAGNOSIS — R002 Palpitations: Secondary | ICD-10-CM | POA: Diagnosis not present

## 2019-03-16 DIAGNOSIS — I471 Supraventricular tachycardia: Secondary | ICD-10-CM | POA: Diagnosis not present

## 2019-03-16 DIAGNOSIS — I491 Atrial premature depolarization: Secondary | ICD-10-CM | POA: Diagnosis not present

## 2019-03-16 DIAGNOSIS — I493 Ventricular premature depolarization: Secondary | ICD-10-CM | POA: Diagnosis not present

## 2019-03-18 ENCOUNTER — Emergency Department (HOSPITAL_BASED_OUTPATIENT_CLINIC_OR_DEPARTMENT_OTHER)
Admission: EM | Admit: 2019-03-18 | Discharge: 2019-03-18 | Disposition: A | Discharge status: Home / Self Care | Payer: Medicare HMO | Attending: Emergency Medicine | Admitting: Emergency Medicine

## 2019-03-18 ENCOUNTER — Emergency Department (HOSPITAL_BASED_OUTPATIENT_CLINIC_OR_DEPARTMENT_OTHER): Payer: Medicare HMO

## 2019-03-18 ENCOUNTER — Other Ambulatory Visit: Payer: Self-pay

## 2019-03-18 ENCOUNTER — Encounter: Payer: Self-pay | Admitting: Family Medicine

## 2019-03-18 ENCOUNTER — Encounter (HOSPITAL_BASED_OUTPATIENT_CLINIC_OR_DEPARTMENT_OTHER): Payer: Self-pay

## 2019-03-18 DIAGNOSIS — R05 Cough: Secondary | ICD-10-CM | POA: Diagnosis not present

## 2019-03-18 DIAGNOSIS — R0602 Shortness of breath: Secondary | ICD-10-CM | POA: Diagnosis not present

## 2019-03-18 DIAGNOSIS — I10 Essential (primary) hypertension: Secondary | ICD-10-CM | POA: Insufficient documentation

## 2019-03-18 DIAGNOSIS — Z87891 Personal history of nicotine dependence: Secondary | ICD-10-CM | POA: Diagnosis not present

## 2019-03-18 DIAGNOSIS — K5900 Constipation, unspecified: Secondary | ICD-10-CM | POA: Diagnosis not present

## 2019-03-18 DIAGNOSIS — J45909 Unspecified asthma, uncomplicated: Secondary | ICD-10-CM | POA: Diagnosis not present

## 2019-03-18 DIAGNOSIS — R112 Nausea with vomiting, unspecified: Secondary | ICD-10-CM | POA: Diagnosis not present

## 2019-03-18 DIAGNOSIS — R140 Abdominal distension (gaseous): Secondary | ICD-10-CM | POA: Insufficient documentation

## 2019-03-18 DIAGNOSIS — R1011 Right upper quadrant pain: Secondary | ICD-10-CM | POA: Diagnosis not present

## 2019-03-18 DIAGNOSIS — Z79899 Other long term (current) drug therapy: Secondary | ICD-10-CM | POA: Diagnosis not present

## 2019-03-18 DIAGNOSIS — R079 Chest pain, unspecified: Secondary | ICD-10-CM | POA: Diagnosis not present

## 2019-03-18 LAB — URINALYSIS, ROUTINE W REFLEX MICROSCOPIC
Bilirubin Urine: NEGATIVE
Glucose, UA: NEGATIVE mg/dL
Hgb urine dipstick: NEGATIVE
Ketones, ur: NEGATIVE mg/dL
Leukocytes,Ua: NEGATIVE
Nitrite: NEGATIVE
Protein, ur: NEGATIVE mg/dL
Specific Gravity, Urine: >1.03 — ABNORMAL HIGH (ref 1.005–1.030)
pH: 5.5 (ref 5.0–8.0)

## 2019-03-18 LAB — PREGNANCY, URINE: Preg Test, Ur: NEGATIVE

## 2019-03-18 NOTE — ED Notes (Signed)
ED Provider at bedside. 

## 2019-03-18 NOTE — ED Triage Notes (Signed)
Pt c/o "SOB, abd pain, cough, nausea, loss of appetite, right lung pain, ear/nose and throat pain"-pt NAD-steady gait

## 2019-03-18 NOTE — ED Notes (Signed)
EKG done in error. Notified EKG department to credit.
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
## 2019-03-18 NOTE — Discharge Instructions (Addendum)
You can take gas-x, available over the counter according to label instructions.  Take your home medications as prescribed.

## 2019-03-19 DIAGNOSIS — K5900 Constipation, unspecified: Secondary | ICD-10-CM | POA: Diagnosis not present

## 2019-03-19 DIAGNOSIS — R1011 Right upper quadrant pain: Secondary | ICD-10-CM | POA: Diagnosis not present

## 2019-03-19 DIAGNOSIS — R112 Nausea with vomiting, unspecified: Secondary | ICD-10-CM | POA: Diagnosis not present

## 2019-03-24 ENCOUNTER — Ambulatory Visit: Payer: Medicare HMO | Admitting: Cardiology

## 2019-03-25 DIAGNOSIS — M7918 Myalgia, other site: Secondary | ICD-10-CM | POA: Diagnosis not present

## 2019-03-25 DIAGNOSIS — S161XXA Strain of muscle, fascia and tendon at neck level, initial encounter: Secondary | ICD-10-CM | POA: Diagnosis not present

## 2019-03-25 DIAGNOSIS — Y999 Unspecified external cause status: Secondary | ICD-10-CM | POA: Diagnosis not present

## 2019-03-25 DIAGNOSIS — R0789 Other chest pain: Secondary | ICD-10-CM | POA: Diagnosis not present

## 2019-03-25 DIAGNOSIS — M542 Cervicalgia: Secondary | ICD-10-CM | POA: Diagnosis not present

## 2019-03-25 DIAGNOSIS — X58XXXA Exposure to other specified factors, initial encounter: Secondary | ICD-10-CM | POA: Diagnosis not present

## 2019-03-25 DIAGNOSIS — M549 Dorsalgia, unspecified: Secondary | ICD-10-CM | POA: Diagnosis not present

## 2019-03-25 DIAGNOSIS — R079 Chest pain, unspecified: Secondary | ICD-10-CM | POA: Diagnosis not present

## 2019-03-25 DIAGNOSIS — R110 Nausea: Secondary | ICD-10-CM | POA: Diagnosis not present

## 2019-03-25 LAB — BASIC METABOLIC PANEL
BUN: 14 (ref 4–21)
Creatinine: 0.7 (ref 0.5–1.1)
Glucose: 123
Potassium: 4.1 (ref 3.4–5.3)
Sodium: 140 (ref 137–147)

## 2019-03-27 ENCOUNTER — Ambulatory Visit (INDEPENDENT_AMBULATORY_CARE_PROVIDER_SITE_OTHER): Payer: Medicare HMO | Admitting: Family Medicine

## 2019-03-27 ENCOUNTER — Encounter: Payer: Self-pay | Admitting: Family Medicine

## 2019-03-27 VITALS — BP 153/90 | HR 72 | Ht 62.0 in

## 2019-03-27 DIAGNOSIS — R69 Illness, unspecified: Secondary | ICD-10-CM | POA: Diagnosis not present

## 2019-03-27 DIAGNOSIS — F418 Other specified anxiety disorders: Secondary | ICD-10-CM

## 2019-03-27 DIAGNOSIS — I10 Essential (primary) hypertension: Secondary | ICD-10-CM | POA: Diagnosis not present

## 2019-03-27 DIAGNOSIS — M79671 Pain in right foot: Secondary | ICD-10-CM

## 2019-03-27 DIAGNOSIS — H9202 Otalgia, left ear: Secondary | ICD-10-CM | POA: Diagnosis not present

## 2019-03-27 DIAGNOSIS — M79672 Pain in left foot: Secondary | ICD-10-CM

## 2019-03-27 MED ORDER — NEOMYCIN-POLYMYXIN-HC 3.5-10000-1 OT SOLN: 4.0000 [drp] | Freq: Four times a day (QID) | OTIC | 0 refills | Status: DC

## 2019-03-27 NOTE — Progress Notes (Signed)
Pt states that her bp all over the place and her dystolic has been elevated most times that she takes it.   She has been feeling really nauseated and states that her stomach has really been bothering her. Trouble w/eating  C/O deep pain in her L ear. And some episodes of vertigo. She said that she hasn't been driving much due to this. And she has been shutting down because of this.   She also informed me about some issues w/walking and it is getting worse and her husband has to help her to get out of bed. She was told by ortho that she can do some stretches and consider PT. She said that she has a lot of pain on the L side all the way down to her foot.   Maryruth Eve, [place] Crocker, CMA
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
## 2019-03-28 ENCOUNTER — Telehealth: Payer: Self-pay

## 2019-03-28 ENCOUNTER — Ambulatory Visit: Payer: Medicare HMO | Admitting: Psychology

## 2019-03-28 MED ORDER — CIPROFLOXACIN HCL 0.3 % OP SOLN: OPHTHALMIC | 0 refills | Status: DC

## 2019-03-28 NOTE — Telephone Encounter (Signed)
Ok rx sent.

## 2019-03-28 NOTE — Telephone Encounter (Signed)
Jennifer [name] called and states the pharmacy told her since she is allergic to Tobramycin that she probably with have a reaction to the Cortisporin. She has itching with Tobramycin. The pharmacy did recommend ciprofloxacin.
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
## 2019-03-31 DIAGNOSIS — R002 Palpitations: Secondary | ICD-10-CM | POA: Diagnosis not present

## 2019-04-01 ENCOUNTER — Telehealth: Payer: Self-pay | Admitting: *Deleted

## 2019-04-01 MED ORDER — LEVALBUTEROL TARTRATE 45 MCG/ACT IN AERO: 2.0000 | INHALATION_SPRAY | Freq: Four times a day (QID) | RESPIRATORY_TRACT | 0 refills | Status: DC | PRN

## 2019-04-01 NOTE — Telephone Encounter (Signed)
LVM pt last seen 03/2018 and needs an ov for any refills.

## 2019-04-01 NOTE — Telephone Encounter (Signed)
Pt scheduled apt with anne and sent a courtesy refill.

## 2019-04-01 NOTE — Assessment & Plan Note (Signed)
Feels like it just continues to get worse to the point that it is hard to walk at times and she is having to use an assistive device.  She is currently following with sports medicine for this.

## 2019-04-01 NOTE — Assessment & Plan Note (Signed)
To really feel like she would benefit from an SSRI.  I know she is tried several things in the past and they did not seem to work or she had side effects.  We also discussed that if she is not continue to take medication is really important that we get her in with a therapist/counselor.  I again stressed how important this is.  She actually had an appointment and then canceled it because she did not feel well.

## 2019-04-01 NOTE — Assessment & Plan Note (Signed)
Home BPs still not well controlled.  Her on labetalol compared to the metoprolol.  Gust increasing her dose.

## 2019-04-01 NOTE — Telephone Encounter (Signed)
Patient needs prescription for xopenex sent to [facility] on [address] in [place]. Pt says that it'll need a prior auth.

## 2019-04-02 DIAGNOSIS — R0600 Dyspnea, unspecified: Secondary | ICD-10-CM | POA: Diagnosis not present

## 2019-04-02 DIAGNOSIS — Z87891 Personal history of nicotine dependence: Secondary | ICD-10-CM | POA: Diagnosis not present

## 2019-04-02 DIAGNOSIS — R002 Palpitations: Secondary | ICD-10-CM | POA: Diagnosis not present

## 2019-04-02 DIAGNOSIS — Z20828 Contact with and (suspected) exposure to other viral communicable diseases: Secondary | ICD-10-CM | POA: Diagnosis not present

## 2019-04-02 DIAGNOSIS — M79601 Pain in right arm: Secondary | ICD-10-CM | POA: Diagnosis not present

## 2019-04-02 DIAGNOSIS — R0602 Shortness of breath: Secondary | ICD-10-CM | POA: Diagnosis not present

## 2019-04-02 DIAGNOSIS — R05 Cough: Secondary | ICD-10-CM | POA: Diagnosis not present

## 2019-04-03 ENCOUNTER — Other Ambulatory Visit: Payer: Self-pay

## 2019-04-03 ENCOUNTER — Telehealth: Payer: Self-pay | Admitting: Family Medicine

## 2019-04-03 ENCOUNTER — Ambulatory Visit (INDEPENDENT_AMBULATORY_CARE_PROVIDER_SITE_OTHER): Payer: Medicare HMO | Admitting: Family Medicine

## 2019-04-03 DIAGNOSIS — R002 Palpitations: Secondary | ICD-10-CM | POA: Diagnosis not present

## 2019-04-03 DIAGNOSIS — M25511 Pain in right shoulder: Secondary | ICD-10-CM | POA: Diagnosis not present

## 2019-04-03 DIAGNOSIS — M25551 Pain in right hip: Secondary | ICD-10-CM | POA: Diagnosis not present

## 2019-04-03 DIAGNOSIS — M25531 Pain in right wrist: Secondary | ICD-10-CM

## 2019-04-03 DIAGNOSIS — M25532 Pain in left wrist: Secondary | ICD-10-CM | POA: Diagnosis not present

## 2019-04-03 DIAGNOSIS — M79672 Pain in left foot: Secondary | ICD-10-CM

## 2019-04-03 DIAGNOSIS — I10 Essential (primary) hypertension: Secondary | ICD-10-CM

## 2019-04-03 DIAGNOSIS — M79671 Pain in right foot: Secondary | ICD-10-CM | POA: Diagnosis not present
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
## 2019-04-03 NOTE — Telephone Encounter (Signed)
Dr. Jarvis Newcomer called and stated you were putting in a referral to a specialist that could help with her BP. She said she found a place in [place] she would like to to to and that its [facility].  She already has an appointment scheduled but needs a referral sent over. - CF

## 2019-04-04 NOTE — Telephone Encounter (Signed)
Order placed

## 2019-04-07 ENCOUNTER — Other Ambulatory Visit: Payer: Self-pay | Admitting: Family Medicine

## 2019-04-07 ENCOUNTER — Ambulatory Visit (INDEPENDENT_AMBULATORY_CARE_PROVIDER_SITE_OTHER): Payer: Medicare HMO | Admitting: Family Medicine

## 2019-04-07 ENCOUNTER — Encounter: Payer: Self-pay | Admitting: Family Medicine

## 2019-04-07 VITALS — BP 143/86 | HR 74 | Ht 62.0 in

## 2019-04-07 DIAGNOSIS — I10 Essential (primary) hypertension: Secondary | ICD-10-CM

## 2019-04-07 DIAGNOSIS — H9202 Otalgia, left ear: Secondary | ICD-10-CM | POA: Diagnosis not present

## 2019-04-07 DIAGNOSIS — R110 Nausea: Secondary | ICD-10-CM | POA: Diagnosis not present

## 2019-04-07 DIAGNOSIS — R42 Dizziness and giddiness: Secondary | ICD-10-CM | POA: Diagnosis not present

## 2019-04-07 DIAGNOSIS — J0100 Acute maxillary sinusitis, unspecified: Secondary | ICD-10-CM | POA: Diagnosis not present

## 2019-04-07 MED ORDER — BUDESONIDE 32 MCG/ACT NA SUSP: 2.0000 | Freq: Every day | NASAL | 2 refills | Status: DC

## 2019-04-07 MED ORDER — SPIRONOLACTONE 25 MG PO TABS: ORAL_TABLET | ORAL | 1 refills | Status: DC

## 2019-04-07 MED ORDER — CLINDAMYCIN HCL 75 MG PO CAPS: 75.0000 mg | ORAL_CAPSULE | Freq: Four times a day (QID) | ORAL | 0 refills | Status: AC

## 2019-04-07 MED ORDER — FAMOTIDINE 40 MG PO TABS: 40.0000 mg | ORAL_TABLET | Freq: Every day | ORAL | 0 refills | Status: DC

## 2019-04-07 NOTE — Progress Notes (Signed)
She feels that it may be her L ear is bothering her again it still hurts.  She has an in office visit on Friday.  Throbbing pain in her ear.   She report that the nasal spray is not really working and feels that she needs a nasal spay that has a steroid in it.  Pt reports that she cannot take most of the ABX. She does ok Doxy and Clindamycin for short periods of time. She has taken Levaquin. Maryruth Eve, Lahoma Crocker, CMA

## 2019-04-07 NOTE — Assessment & Plan Note (Signed)
Really encouraged her to keep a blood pressure log and not to check her blood pressure multiple times a day.  I explained that even when people are compliant with the regimen them are still be days where the blood pressure elevates or goes little bit more low but really what we have to do is look at trends and not individual days.  She is constantly adjusting her dose on her own.  For now we will stick with labetalol 50 mg 3 times a day and then review her log when I see her on Friday.
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
## 2019-04-07 NOTE — Telephone Encounter (Signed)
Please call patient and let her know that the budesonide was not covered by insurance.  So unfortunately I think she is can have to go back to her Nasacort.

## 2019-04-08 DIAGNOSIS — R110 Nausea: Secondary | ICD-10-CM | POA: Diagnosis not present

## 2019-04-08 DIAGNOSIS — J029 Acute pharyngitis, unspecified: Secondary | ICD-10-CM | POA: Diagnosis not present

## 2019-04-08 DIAGNOSIS — Z87891 Personal history of nicotine dependence: Secondary | ICD-10-CM | POA: Diagnosis not present

## 2019-04-08 NOTE — Telephone Encounter (Signed)
lvm advising pt that the nasal spray is not covered by insurance.Maryruth Eve, Lahoma Crocker, CMA

## 2019-04-09 ENCOUNTER — Ambulatory Visit: Payer: Medicare HMO | Admitting: Family Medicine

## 2019-04-09 ENCOUNTER — Other Ambulatory Visit: Payer: Self-pay

## 2019-04-09 DIAGNOSIS — E876 Hypokalemia: Secondary | ICD-10-CM

## 2019-04-10 ENCOUNTER — Telehealth: Payer: Self-pay

## 2019-04-10 DIAGNOSIS — R0602 Shortness of breath: Secondary | ICD-10-CM | POA: Diagnosis not present

## 2019-04-10 DIAGNOSIS — R112 Nausea with vomiting, unspecified: Secondary | ICD-10-CM | POA: Diagnosis not present

## 2019-04-10 DIAGNOSIS — R05 Cough: Secondary | ICD-10-CM | POA: Diagnosis not present

## 2019-04-10 DIAGNOSIS — J029 Acute pharyngitis, unspecified: Secondary | ICD-10-CM | POA: Diagnosis not present

## 2019-04-10 DIAGNOSIS — Z87891 Personal history of nicotine dependence: Secondary | ICD-10-CM | POA: Diagnosis not present

## 2019-04-10 NOTE — Telephone Encounter (Signed)
Patient called wanting to be worked in by provider today for throat swelling, difficulty breathing, and coughing. Patient states she has had worsening SX since yesterday and thinks she needs to come in so Dr Madilyn Fireman can give her a shot.   I advised patient that she needs to seek emergency attention for above SX. Patient states she is going to ER.   Patient has in-office appt with provider tomorrow.. please advise if we should reschedule or change to virtual etc

## 2019-04-10 NOTE — Telephone Encounter (Signed)
Will keep appt for tomorrow

## 2019-04-11 ENCOUNTER — Encounter: Payer: Self-pay | Admitting: Family Medicine

## 2019-04-11 ENCOUNTER — Ambulatory Visit (INDEPENDENT_AMBULATORY_CARE_PROVIDER_SITE_OTHER): Payer: Medicare HMO | Admitting: Family Medicine

## 2019-04-11 ENCOUNTER — Other Ambulatory Visit: Payer: Self-pay

## 2019-04-11 VITALS — BP 147/79 | HR 94 | Temp 99.0°F | Ht 62.0 in | Wt 226.0 lb

## 2019-04-11 DIAGNOSIS — R110 Nausea: Secondary | ICD-10-CM | POA: Diagnosis not present

## 2019-04-11 DIAGNOSIS — H9201 Otalgia, right ear: Secondary | ICD-10-CM | POA: Diagnosis not present

## 2019-04-11 DIAGNOSIS — I10 Essential (primary) hypertension: Secondary | ICD-10-CM | POA: Diagnosis not present

## 2019-04-11 DIAGNOSIS — Z13220 Encounter for screening for lipoid disorders: Secondary | ICD-10-CM

## 2019-04-11 DIAGNOSIS — R0602 Shortness of breath: Secondary | ICD-10-CM

## 2019-04-11 DIAGNOSIS — J029 Acute pharyngitis, unspecified: Secondary | ICD-10-CM | POA: Diagnosis not present

## 2019-04-11 DIAGNOSIS — R221 Localized swelling, mass and lump, neck: Secondary | ICD-10-CM | POA: Diagnosis not present

## 2019-04-11 DIAGNOSIS — J0100 Acute maxillary sinusitis, unspecified: Secondary | ICD-10-CM | POA: Diagnosis not present

## 2019-04-11 MED ORDER — DEXAMETHASONE 2 MG PO TABS: 2.0000 mg | ORAL_TABLET | Freq: Two times a day (BID) | ORAL | 0 refills | Status: DC
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
## 2019-04-11 NOTE — Assessment & Plan Note (Signed)
Blood pressure is still too elevated today.  We discussed going back up on the labetalol and then just continuing with a half a tab of spironolactone midday that seems to be working really encouraged her to keep a blood pressure log instead of just continuing to adjust her medication on her own.  I reminded her that it can often take a couple of weeks after any medication change to really see how how the change impacts her blood pressures.  Reminded her again that part of controlling blood pressure is really looking at the overall trend and not necessarily the individual blood pressures themselves.  We are getting her in with a HTN clinic in [facility] that specializes in hypertension control and she does have an upcoming appointment for that.

## 2019-04-11 NOTE — Assessment & Plan Note (Signed)
Has new patient consultation with hypertension specialty clinic in [facility]

## 2019-04-15 ENCOUNTER — Encounter (HOSPITAL_BASED_OUTPATIENT_CLINIC_OR_DEPARTMENT_OTHER): Payer: Self-pay

## 2019-04-15 ENCOUNTER — Other Ambulatory Visit: Payer: Self-pay

## 2019-04-15 ENCOUNTER — Emergency Department (HOSPITAL_BASED_OUTPATIENT_CLINIC_OR_DEPARTMENT_OTHER)
Admission: EM | Admit: 2019-04-15 | Discharge: 2019-04-15 | Disposition: A | Discharge status: Home / Self Care | Payer: Medicare HMO | Attending: Emergency Medicine | Admitting: Emergency Medicine

## 2019-04-15 DIAGNOSIS — M795 Residual foreign body in soft tissue: Secondary | ICD-10-CM | POA: Diagnosis not present

## 2019-04-15 DIAGNOSIS — J45909 Unspecified asthma, uncomplicated: Secondary | ICD-10-CM | POA: Diagnosis not present

## 2019-04-15 DIAGNOSIS — Y999 Unspecified external cause status: Secondary | ICD-10-CM | POA: Diagnosis not present

## 2019-04-15 DIAGNOSIS — Z886 Allergy status to analgesic agent status: Secondary | ICD-10-CM | POA: Diagnosis not present

## 2019-04-15 DIAGNOSIS — I10 Essential (primary) hypertension: Secondary | ICD-10-CM | POA: Insufficient documentation

## 2019-04-15 DIAGNOSIS — Z881 Allergy status to other antibiotic agents status: Secondary | ICD-10-CM | POA: Diagnosis not present

## 2019-04-15 DIAGNOSIS — Z87891 Personal history of nicotine dependence: Secondary | ICD-10-CM | POA: Diagnosis not present

## 2019-04-15 DIAGNOSIS — Z880 Allergy status to penicillin: Secondary | ICD-10-CM | POA: Insufficient documentation

## 2019-04-15 DIAGNOSIS — R000 Tachycardia, unspecified: Secondary | ICD-10-CM | POA: Diagnosis not present

## 2019-04-15 DIAGNOSIS — R1084 Generalized abdominal pain: Secondary | ICD-10-CM | POA: Diagnosis not present

## 2019-04-15 DIAGNOSIS — Z882 Allergy status to sulfonamides status: Secondary | ICD-10-CM | POA: Insufficient documentation

## 2019-04-15 DIAGNOSIS — S60351A Superficial foreign body of right thumb, initial encounter: Secondary | ICD-10-CM | POA: Diagnosis not present

## 2019-04-15 DIAGNOSIS — Z79899 Other long term (current) drug therapy: Secondary | ICD-10-CM | POA: Insufficient documentation

## 2019-04-15 DIAGNOSIS — Z888 Allergy status to other drugs, medicaments and biological substances status: Secondary | ICD-10-CM | POA: Diagnosis not present

## 2019-04-15 DIAGNOSIS — M797 Fibromyalgia: Secondary | ICD-10-CM | POA: Insufficient documentation

## 2019-04-15 DIAGNOSIS — E785 Hyperlipidemia, unspecified: Secondary | ICD-10-CM | POA: Diagnosis not present

## 2019-04-15 DIAGNOSIS — R69 Illness, unspecified: Secondary | ICD-10-CM | POA: Diagnosis not present

## 2019-04-15 DIAGNOSIS — W461XXA Contact with contaminated hypodermic needle, initial encounter: Secondary | ICD-10-CM | POA: Diagnosis not present

## 2019-04-15 LAB — CBC WITH DIFFERENTIAL/PLATELET
Abs Immature Granulocytes: 0.03 10*3/uL (ref 0.00–0.07)
Basophils Absolute: 0 10*3/uL (ref 0.0–0.1)
Basophils Relative: 1 %
Eosinophils Absolute: 0.2 10*3/uL (ref 0.0–0.5)
Eosinophils Relative: 2 %
HCT: 40.4 % (ref 36.0–46.0)
Hemoglobin: 12.9 g/dL (ref 12.0–15.0)
Immature Granulocytes: 0 %
Lymphocytes Relative: 21 %
Lymphs Abs: 1.5 10*3/uL (ref 0.7–4.0)
MCH: 28.9 pg (ref 26.0–34.0)
MCHC: 31.9 g/dL (ref 30.0–36.0)
MCV: 90.4 fL (ref 80.0–100.0)
Monocytes Absolute: 0.7 10*3/uL (ref 0.1–1.0)
Monocytes Relative: 10 %
Neutro Abs: 4.7 10*3/uL (ref 1.7–7.7)
Neutrophils Relative %: 66 %
Platelets: 198 10*3/uL (ref 150–400)
RBC: 4.47 MIL/uL (ref 3.87–5.11)
RDW: 13.5 % (ref 11.5–15.5)
WBC: 7.1 10*3/uL (ref 4.0–10.5)
nRBC: 0 % (ref 0.0–0.2)

## 2019-04-15 LAB — COMPREHENSIVE METABOLIC PANEL
ALT: 30 U/L (ref 0–44)
AST: 16 U/L (ref 15–41)
Albumin: 3.7 g/dL (ref 3.5–5.0)
Alkaline Phosphatase: 61 U/L (ref 38–126)
Anion gap: 10 (ref 5–15)
BUN: 14 mg/dL (ref 6–20)
CO2: 22 mmol/L (ref 22–32)
Calcium: 8.7 mg/dL — ABNORMAL LOW (ref 8.9–10.3)
Chloride: 107 mmol/L (ref 98–111)
Creatinine, Ser: 0.51 mg/dL (ref 0.44–1.00)
GFR calc Af Amer: >60 mL/min (ref >60)
GFR calc non Af Amer: >60 mL/min (ref >60)
Glucose, Bld: 121 mg/dL — ABNORMAL HIGH (ref 70–99)
Potassium: 3.7 mmol/L (ref 3.5–5.1)
Sodium: 139 mmol/L (ref 135–145)
Total Bilirubin: 0.2 mg/dL — ABNORMAL LOW (ref 0.3–1.2)
Total Protein: 6.7 g/dL (ref 6.5–8.1)

## 2019-04-15 LAB — LIPASE, BLOOD: Lipase: 33 U/L (ref 11–51)

## 2019-04-15 MED ORDER — DICYCLOMINE HCL 10 MG/5ML PO SOLN
10.0000 mg | Freq: Once | ORAL | Status: DC
  Filled 2019-04-15: qty 5

## 2019-04-15 MED ORDER — DICYCLOMINE HCL 10 MG PO CAPS
ORAL_CAPSULE | ORAL | Status: AC
  Administered 2019-04-15: 10 mg
  Filled 2019-04-15: qty 1

## 2019-04-15 MED ORDER — ALUM & MAG HYDROXIDE-SIMETH 200-200-20 MG/5ML PO SUSP
30.0000 mL | Freq: Once | ORAL | Status: AC
  Administered 2019-04-15: 30 mL via ORAL
  Filled 2019-04-15: qty 30
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
## 2019-04-15 NOTE — Discharge Instructions (Signed)
Your lab work does not show any significant finding.  There are no concerns for hepatitis or pancreatitis.  There is no significant electrolyte abnormality.  Your white count is normal.  Please follow-up with your family doctor in the office.  Try a MiraLAX cleanout at home.  Take 8 scoops of miralax in 32oz of whatever you would like to drink.(Gatorade comes in this size) You can also use a fleets enema which you can buy over the counter at the pharmacy.  Return for worsening abdominal pain, vomiting or fever.   Try zantac or pepcid(famotidine) twice a day.  Try to avoid things that may make this worse, most commonly these are spicy foods tomato based products fatty foods chocolate and peppermint.  Alcohol and tobacco can also make this worse.  Return to the emergency department for sudden worsening pain fever or inability to eat or drink.

## 2019-04-15 NOTE — ED Notes (Signed)
ED Provider at bedside. 

## 2019-04-15 NOTE — ED Triage Notes (Signed)
Pt c/o generalized abd pain, n/v x 2 months-NAD-steady gait

## 2019-04-16 DIAGNOSIS — R52 Pain, unspecified: Secondary | ICD-10-CM | POA: Diagnosis not present

## 2019-04-16 DIAGNOSIS — S60351A Superficial foreign body of right thumb, initial encounter: Secondary | ICD-10-CM | POA: Diagnosis not present

## 2019-04-16 DIAGNOSIS — W461XXA Contact with contaminated hypodermic needle, initial encounter: Secondary | ICD-10-CM | POA: Diagnosis not present

## 2019-04-16 DIAGNOSIS — R69 Illness, unspecified: Secondary | ICD-10-CM | POA: Diagnosis not present

## 2019-04-16 DIAGNOSIS — R000 Tachycardia, unspecified: Secondary | ICD-10-CM | POA: Diagnosis not present

## 2019-04-16 DIAGNOSIS — I10 Essential (primary) hypertension: Secondary | ICD-10-CM | POA: Diagnosis not present

## 2019-04-16 DIAGNOSIS — M795 Residual foreign body in soft tissue: Secondary | ICD-10-CM | POA: Diagnosis not present

## 2019-04-16 DIAGNOSIS — Y999 Unspecified external cause status: Secondary | ICD-10-CM | POA: Diagnosis not present

## 2019-04-17 ENCOUNTER — Encounter: Payer: Self-pay | Admitting: Family Medicine

## 2019-04-17 ENCOUNTER — Ambulatory Visit: Payer: Medicare HMO | Admitting: Psychology

## 2019-04-17 ENCOUNTER — Other Ambulatory Visit: Payer: Self-pay

## 2019-04-17 ENCOUNTER — Ambulatory Visit (INDEPENDENT_AMBULATORY_CARE_PROVIDER_SITE_OTHER): Payer: Medicare HMO | Admitting: Family Medicine

## 2019-04-17 DIAGNOSIS — K5904 Chronic idiopathic constipation: Secondary | ICD-10-CM

## 2019-04-17 DIAGNOSIS — R1084 Generalized abdominal pain: Secondary | ICD-10-CM | POA: Diagnosis not present

## 2019-04-17 DIAGNOSIS — S6991XA Unspecified injury of right wrist, hand and finger(s), initial encounter: Secondary | ICD-10-CM

## 2019-04-17 DIAGNOSIS — R531 Weakness: Secondary | ICD-10-CM

## 2019-04-17 MED ORDER — VANCOMYCIN HCL 250 MG PO CAPS: 250.0000 mg | ORAL_CAPSULE | Freq: Four times a day (QID) | ORAL | 0 refills | Status: DC

## 2019-04-17 NOTE — Telephone Encounter (Signed)
Per PCP request - left VM for Pt to schedule visit. PCP advised she can be added to acute slot that is open this afternoon.
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
## 2019-04-18 ENCOUNTER — Telehealth: Payer: Self-pay | Admitting: Family Medicine

## 2019-04-18 DIAGNOSIS — R000 Tachycardia, unspecified: Secondary | ICD-10-CM | POA: Diagnosis not present

## 2019-04-18 DIAGNOSIS — R002 Palpitations: Secondary | ICD-10-CM | POA: Diagnosis not present

## 2019-04-18 DIAGNOSIS — H524 Presbyopia: Secondary | ICD-10-CM | POA: Diagnosis not present

## 2019-04-18 NOTE — Telephone Encounter (Signed)
Routing to Dr. Madilyn Fireman for advice.Jennifer [name], Lahoma Crocker, CMA

## 2019-04-18 NOTE — Telephone Encounter (Signed)
The antibiotic that was sent in is completley out till 04/22/19. She wants to know if she can wait till Tuesday or does a new one to be sent in.  Please advise.

## 2019-04-20 DIAGNOSIS — R000 Tachycardia, unspecified: Secondary | ICD-10-CM | POA: Diagnosis not present

## 2019-04-22 ENCOUNTER — Ambulatory Visit: Payer: Medicare HMO | Admitting: Psychology

## 2019-04-22 ENCOUNTER — Telehealth: Payer: Self-pay | Admitting: Family Medicine

## 2019-04-22 NOTE — Telephone Encounter (Signed)
Pt called and ask to speak to [facility] in referrals -- Patient would like an update on her (2) ENT Referrals and Also her Home Health Referral. Please call pt and let her know where we are with these referrals.  Thanks

## 2019-04-23 ENCOUNTER — Ambulatory Visit: Payer: Medicare HMO | Admitting: Family Medicine

## 2019-04-23 DIAGNOSIS — M79644 Pain in right finger(s): Secondary | ICD-10-CM | POA: Diagnosis not present

## 2019-04-23 DIAGNOSIS — R21 Rash and other nonspecific skin eruption: Secondary | ICD-10-CM | POA: Diagnosis not present

## 2019-04-23 NOTE — Telephone Encounter (Signed)
I spoke with Jennifer [name] and let her know that all referrals have been sent. Dr. Lenn Cal office did call and they will not schedule her. She is going to call Dr. Tawanna Sat office and I am going to check on her [place] referral - [facility]

## 2019-04-24 ENCOUNTER — Telehealth: Payer: Self-pay

## 2019-04-24 ENCOUNTER — Encounter: Payer: Self-pay | Admitting: Family Medicine

## 2019-04-24 DIAGNOSIS — M545 Low back pain: Secondary | ICD-10-CM | POA: Diagnosis not present

## 2019-04-24 DIAGNOSIS — M47816 Spondylosis without myelopathy or radiculopathy, lumbar region: Secondary | ICD-10-CM | POA: Diagnosis not present

## 2019-04-24 DIAGNOSIS — E876 Hypokalemia: Secondary | ICD-10-CM | POA: Diagnosis not present

## 2019-04-24 DIAGNOSIS — Z13220 Encounter for screening for lipoid disorders: Secondary | ICD-10-CM | POA: Diagnosis not present

## 2019-04-24 DIAGNOSIS — W1839XA Other fall on same level, initial encounter: Secondary | ICD-10-CM | POA: Diagnosis not present

## 2019-04-24 DIAGNOSIS — Y999 Unspecified external cause status: Secondary | ICD-10-CM | POA: Diagnosis not present

## 2019-04-24 DIAGNOSIS — R251 Tremor, unspecified: Secondary | ICD-10-CM | POA: Diagnosis not present

## 2019-04-24 DIAGNOSIS — S7012XA Contusion of left thigh, initial encounter: Secondary | ICD-10-CM | POA: Diagnosis not present

## 2019-04-24 DIAGNOSIS — S3992XA Unspecified injury of lower back, initial encounter: Secondary | ICD-10-CM | POA: Diagnosis not present

## 2019-04-24 DIAGNOSIS — R531 Weakness: Secondary | ICD-10-CM | POA: Diagnosis not present

## 2019-04-24 DIAGNOSIS — G35 Multiple sclerosis: Secondary | ICD-10-CM | POA: Diagnosis not present

## 2019-04-24 NOTE — Telephone Encounter (Signed)
Agree with triage recommendation

## 2019-04-24 NOTE — Telephone Encounter (Signed)
Jennifer [name] called and states she fell on her walker and onto the floor. She said she felt weak/lightheaded. Her blood pressure now is 170/90. I advised her to call someone to pick her up and take her to the urgent care to be evaluated.

## 2019-04-25 ENCOUNTER — Other Ambulatory Visit: Payer: Self-pay

## 2019-04-25 ENCOUNTER — Emergency Department (HOSPITAL_BASED_OUTPATIENT_CLINIC_OR_DEPARTMENT_OTHER)
Admission: EM | Admit: 2019-04-25 | Discharge: 2019-04-25 | Disposition: A | Discharge status: Home / Self Care | Payer: Medicare HMO | Attending: Emergency Medicine | Admitting: Emergency Medicine

## 2019-04-25 ENCOUNTER — Emergency Department (HOSPITAL_BASED_OUTPATIENT_CLINIC_OR_DEPARTMENT_OTHER): Payer: Medicare HMO

## 2019-04-25 ENCOUNTER — Encounter (HOSPITAL_BASED_OUTPATIENT_CLINIC_OR_DEPARTMENT_OTHER): Payer: Self-pay | Admitting: *Deleted

## 2019-04-25 DIAGNOSIS — Z882 Allergy status to sulfonamides status: Secondary | ICD-10-CM | POA: Insufficient documentation

## 2019-04-25 DIAGNOSIS — J4520 Mild intermittent asthma, uncomplicated: Secondary | ICD-10-CM | POA: Insufficient documentation

## 2019-04-25 DIAGNOSIS — R531 Weakness: Secondary | ICD-10-CM | POA: Diagnosis not present

## 2019-04-25 DIAGNOSIS — Y999 Unspecified external cause status: Secondary | ICD-10-CM | POA: Diagnosis not present

## 2019-04-25 DIAGNOSIS — E785 Hyperlipidemia, unspecified: Secondary | ICD-10-CM | POA: Insufficient documentation

## 2019-04-25 DIAGNOSIS — S3992XA Unspecified injury of lower back, initial encounter: Secondary | ICD-10-CM | POA: Diagnosis not present

## 2019-04-25 DIAGNOSIS — W19XXXA Unspecified fall, initial encounter: Secondary | ICD-10-CM

## 2019-04-25 DIAGNOSIS — Z79899 Other long term (current) drug therapy: Secondary | ICD-10-CM | POA: Insufficient documentation

## 2019-04-25 DIAGNOSIS — R0781 Pleurodynia: Secondary | ICD-10-CM

## 2019-04-25 DIAGNOSIS — Z888 Allergy status to other drugs, medicaments and biological substances status: Secondary | ICD-10-CM | POA: Diagnosis not present

## 2019-04-25 DIAGNOSIS — M545 Low back pain: Secondary | ICD-10-CM | POA: Diagnosis not present

## 2019-04-25 DIAGNOSIS — R0789 Other chest pain: Secondary | ICD-10-CM | POA: Diagnosis present

## 2019-04-25 DIAGNOSIS — Z881 Allergy status to other antibiotic agents status: Secondary | ICD-10-CM | POA: Insufficient documentation

## 2019-04-25 DIAGNOSIS — Z880 Allergy status to penicillin: Secondary | ICD-10-CM | POA: Insufficient documentation

## 2019-04-25 DIAGNOSIS — R251 Tremor, unspecified: Secondary | ICD-10-CM | POA: Diagnosis not present

## 2019-04-25 DIAGNOSIS — I10 Essential (primary) hypertension: Secondary | ICD-10-CM | POA: Insufficient documentation

## 2019-04-25 DIAGNOSIS — M797 Fibromyalgia: Secondary | ICD-10-CM | POA: Diagnosis not present

## 2019-04-25 DIAGNOSIS — R9431 Abnormal electrocardiogram [ECG] [EKG]: Secondary | ICD-10-CM | POA: Diagnosis not present

## 2019-04-25 DIAGNOSIS — S7012XA Contusion of left thigh, initial encounter: Secondary | ICD-10-CM | POA: Diagnosis not present

## 2019-04-25 DIAGNOSIS — R55 Syncope and collapse: Secondary | ICD-10-CM | POA: Diagnosis not present

## 2019-04-25 DIAGNOSIS — R0602 Shortness of breath: Secondary | ICD-10-CM | POA: Diagnosis not present

## 2019-04-25 DIAGNOSIS — W1839XA Other fall on same level, initial encounter: Secondary | ICD-10-CM | POA: Diagnosis not present

## 2019-04-25 DIAGNOSIS — Z87891 Personal history of nicotine dependence: Secondary | ICD-10-CM | POA: Diagnosis not present

## 2019-04-25 DIAGNOSIS — S299XXA Unspecified injury of thorax, initial encounter: Secondary | ICD-10-CM | POA: Diagnosis not present

## 2019-04-25 DIAGNOSIS — G35 Multiple sclerosis: Secondary | ICD-10-CM | POA: Diagnosis not present
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
## 2019-04-25 NOTE — Discharge Instructions (Addendum)
Your chest x-ray shows no evidence of rib fracture or other abnormality.  Please follow-up with your primary doctor as planned.

## 2019-04-25 NOTE — ED Triage Notes (Signed)
Pt states she fell yesterday-was seen at [facility] ED-states she is feeling SOB "and they didn't check that out"-NAD-steady gait

## 2019-04-29 IMAGING — DX DG FOOT COMPLETE 3+V*R*
3 series · 3 of 3 positions shown · non-contrast
Comparison: 05/24/2016

CLINICAL DATA: Injury to the right great toe

EXAM:
RIGHT FOOT COMPLETE - 3+ VIEW

[foot ap]
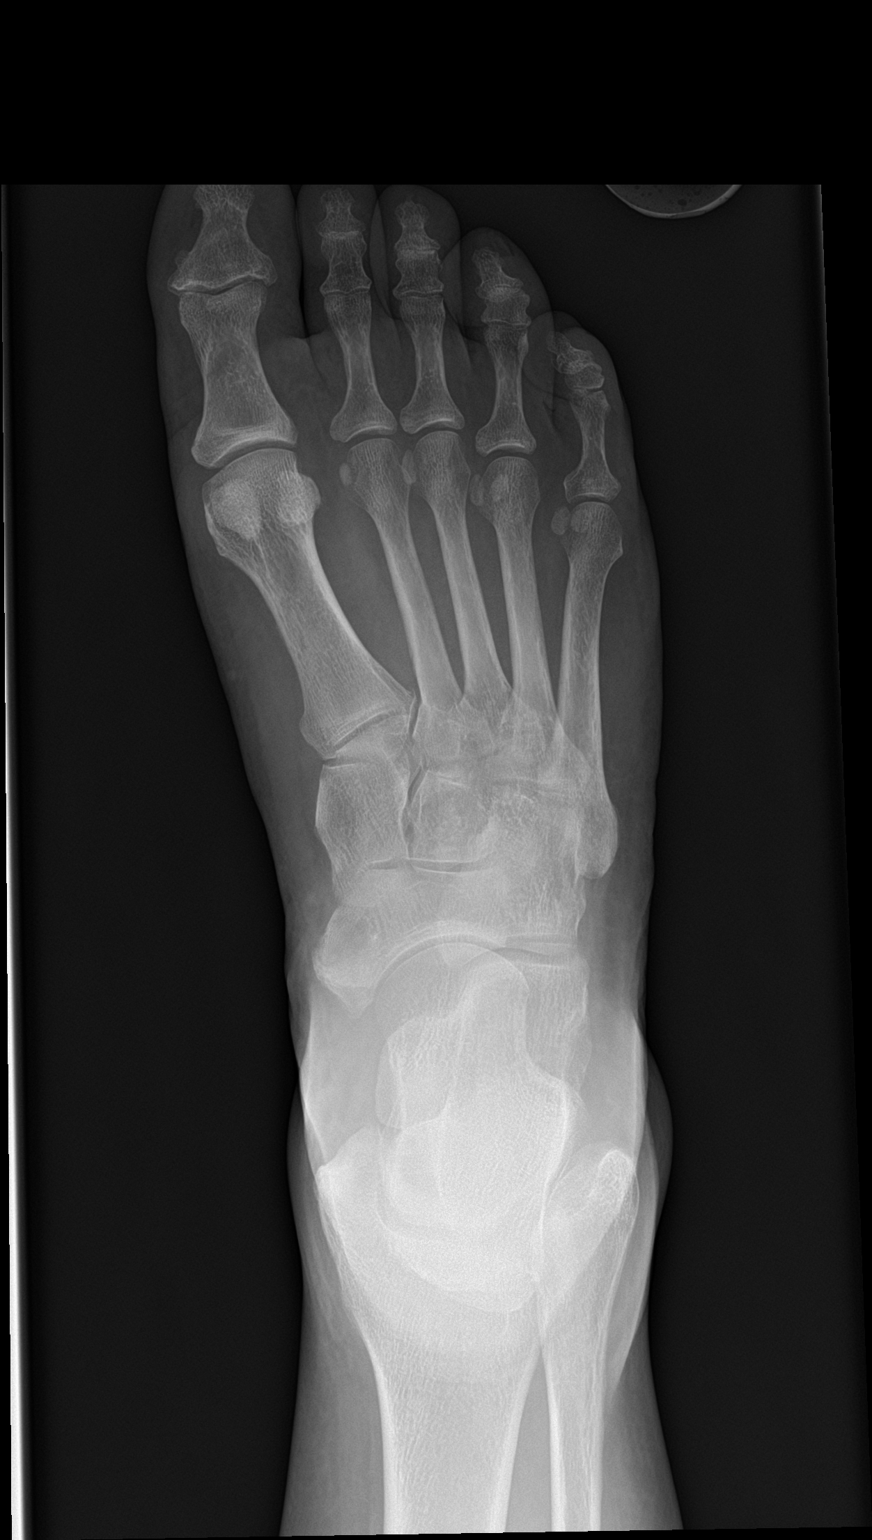

[foot obl]
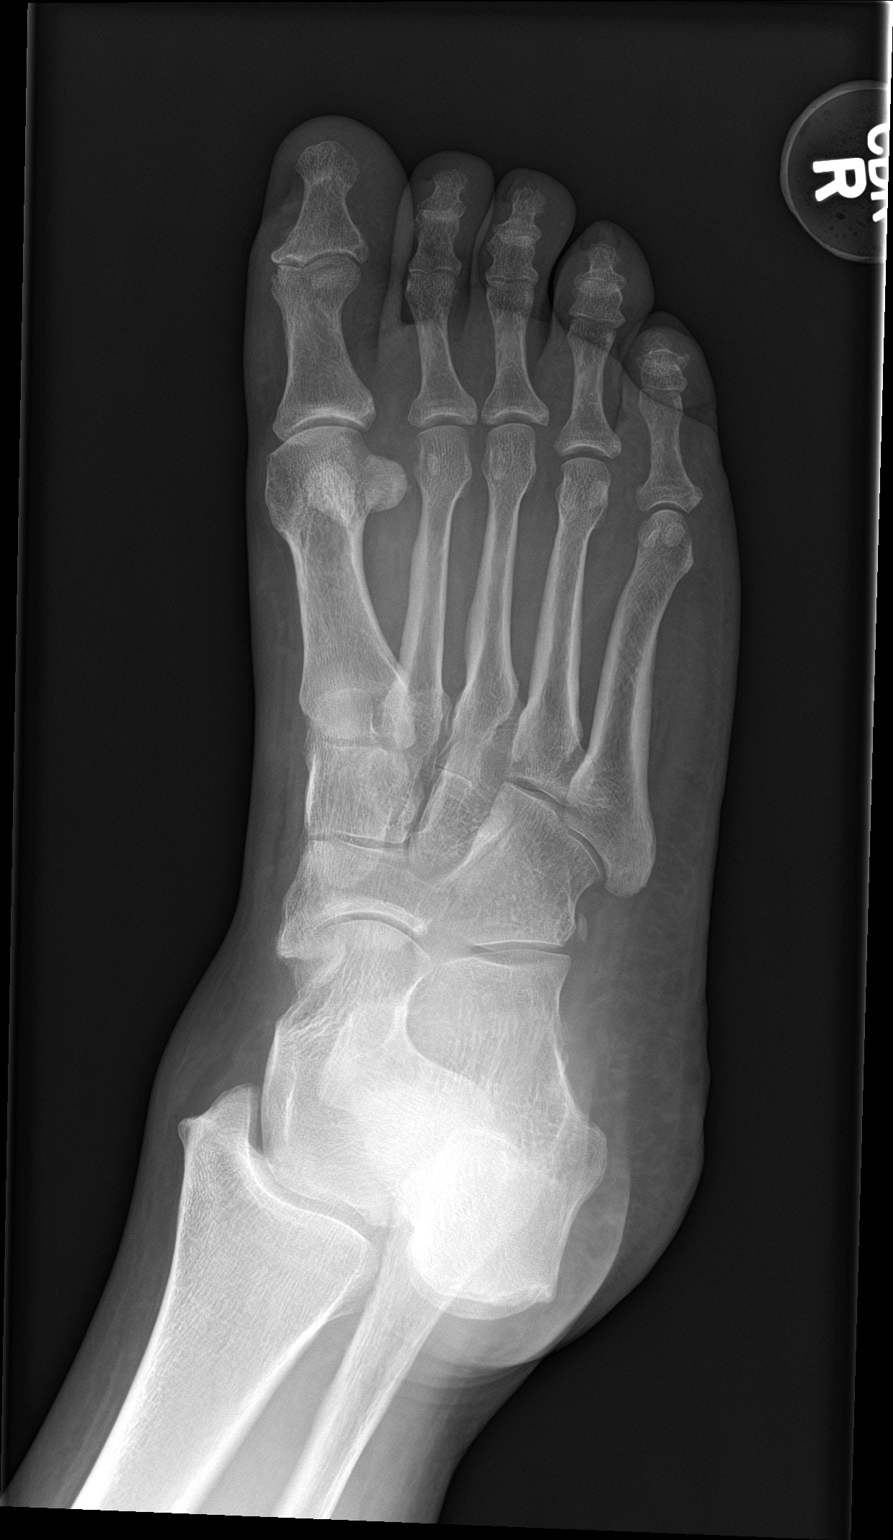

[foot lat]
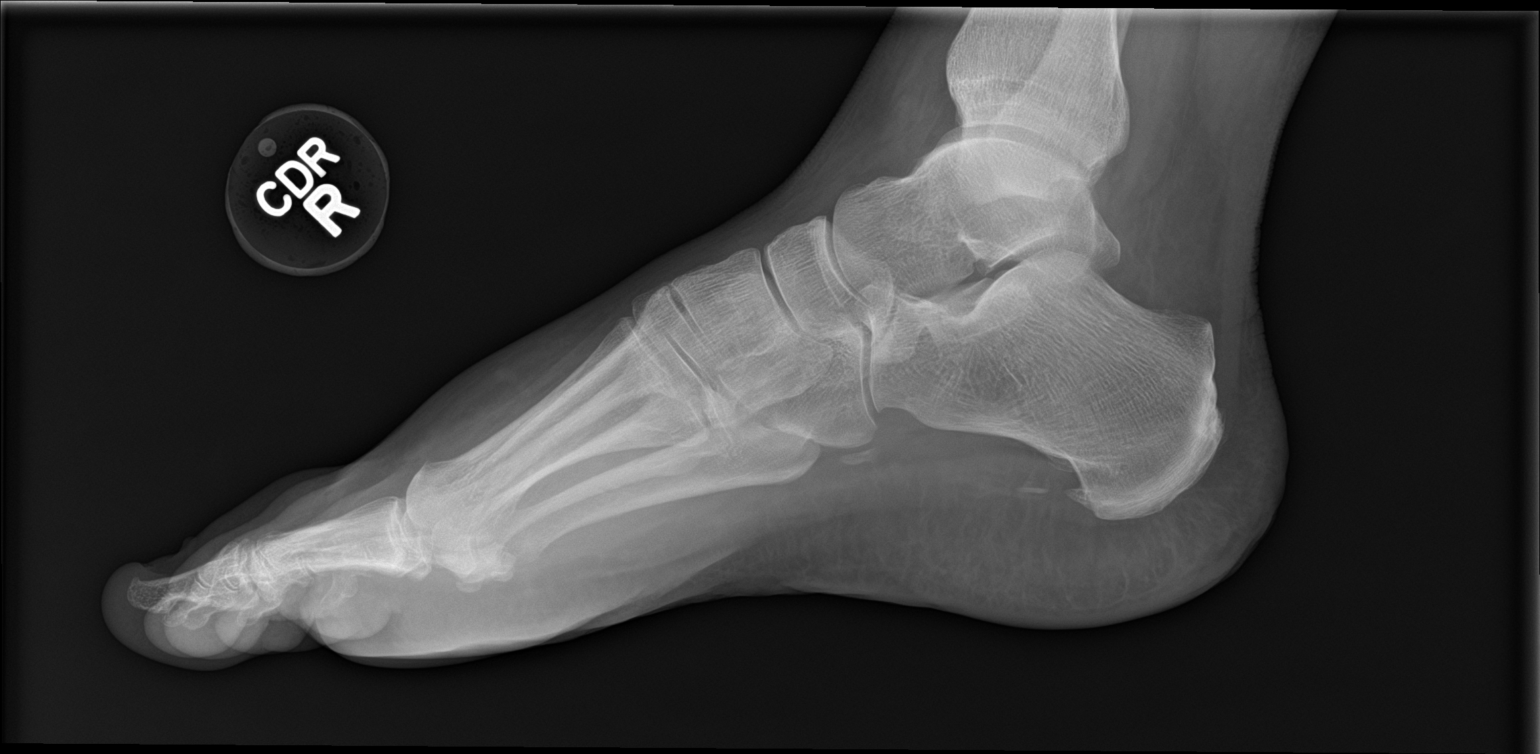

[3 of 3 positions shown; findings below may reference images not displayed]

FINDINGS: There is no evidence of fracture or dislocation. There is no
evidence of arthropathy or other focal bone abnormality. Soft
tissues are unremarkable. Small plantar calcaneal spur.
IMPRESSION: No acute osseous abnormality.

## 2019-04-30 DIAGNOSIS — R05 Cough: Secondary | ICD-10-CM | POA: Diagnosis not present

## 2019-04-30 DIAGNOSIS — R0602 Shortness of breath: Secondary | ICD-10-CM | POA: Diagnosis not present

## 2019-04-30 DIAGNOSIS — R112 Nausea with vomiting, unspecified: Secondary | ICD-10-CM | POA: Diagnosis not present

## 2019-04-30 DIAGNOSIS — Z87891 Personal history of nicotine dependence: Secondary | ICD-10-CM | POA: Diagnosis not present

## 2019-04-30 DIAGNOSIS — K5900 Constipation, unspecified: Secondary | ICD-10-CM | POA: Diagnosis not present

## 2019-05-01 DIAGNOSIS — R079 Chest pain, unspecified: Secondary | ICD-10-CM | POA: Diagnosis not present

## 2019-05-02 ENCOUNTER — Other Ambulatory Visit: Payer: Self-pay

## 2019-05-02 ENCOUNTER — Ambulatory Visit (INDEPENDENT_AMBULATORY_CARE_PROVIDER_SITE_OTHER): Payer: Medicare HMO | Admitting: Family Medicine

## 2019-05-02 ENCOUNTER — Encounter: Payer: Self-pay | Admitting: Family Medicine

## 2019-05-02 VITALS — BP 155/74 | HR 97 | Temp 99.1°F | Ht 62.0 in | Wt 225.0 lb

## 2019-05-02 DIAGNOSIS — R69 Illness, unspecified: Secondary | ICD-10-CM | POA: Diagnosis not present

## 2019-05-02 DIAGNOSIS — K5909 Other constipation: Secondary | ICD-10-CM

## 2019-05-02 DIAGNOSIS — K5904 Chronic idiopathic constipation: Secondary | ICD-10-CM

## 2019-05-02 DIAGNOSIS — R531 Weakness: Secondary | ICD-10-CM

## 2019-05-02 DIAGNOSIS — R079 Chest pain, unspecified: Secondary | ICD-10-CM | POA: Diagnosis not present

## 2019-05-02 DIAGNOSIS — Z91011 Allergy to milk products: Secondary | ICD-10-CM

## 2019-05-02 DIAGNOSIS — Z87891 Personal history of nicotine dependence: Secondary | ICD-10-CM | POA: Diagnosis not present

## 2019-05-02 DIAGNOSIS — K219 Gastro-esophageal reflux disease without esophagitis: Secondary | ICD-10-CM | POA: Diagnosis not present

## 2019-05-02 DIAGNOSIS — R1084 Generalized abdominal pain: Secondary | ICD-10-CM

## 2019-05-02 DIAGNOSIS — R5383 Other fatigue: Secondary | ICD-10-CM | POA: Diagnosis not present

## 2019-05-02 DIAGNOSIS — R220 Localized swelling, mass and lump, head: Secondary | ICD-10-CM | POA: Diagnosis not present

## 2019-05-02 DIAGNOSIS — J029 Acute pharyngitis, unspecified: Secondary | ICD-10-CM | POA: Diagnosis not present

## 2019-05-02 DIAGNOSIS — M542 Cervicalgia: Secondary | ICD-10-CM | POA: Diagnosis not present

## 2019-05-02 DIAGNOSIS — R0602 Shortness of breath: Secondary | ICD-10-CM | POA: Diagnosis not present

## 2019-05-02 DIAGNOSIS — R1310 Dysphagia, unspecified: Secondary | ICD-10-CM

## 2019-05-02 MED ORDER — FAMOTIDINE 40 MG PO TABS: 40.0000 mg | ORAL_TABLET | Freq: Two times a day (BID) | ORAL | 0 refills | Status: DC

## 2019-05-02 NOTE — Assessment & Plan Note (Signed)
Starting to feel like food is getting stuck again.  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  I really think her increased reflux and recent vomiting has inflamed her throat and now feels like it is really irritated and swollen.  Can increase her famotidine to twice a day.  She can also use Tums intermittently.

## 2019-05-02 NOTE — Assessment & Plan Note (Signed)
Discussed again that she needs to schedule her lactulose dosing instead of waiting until she is constipated she needs to go ahead and get on a regimen with it.  Whether that is daily, or twice a day or once every other days she needs to figure out which pattern would work well for her and stick to that.  In the short-term she can go ahead and start taking it up to 3 times a day until she feels like she gets a good cleanout from her bowels.

## 2019-05-02 NOTE — Assessment & Plan Note (Signed)
Increase H2 blocker to twice a day and add Tums as needed.
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
## 2019-05-06 ENCOUNTER — Encounter: Payer: Self-pay | Admitting: Family Medicine

## 2019-05-07 ENCOUNTER — Encounter: Payer: Self-pay | Admitting: Family Medicine

## 2019-05-07 ENCOUNTER — Other Ambulatory Visit: Payer: Self-pay

## 2019-05-07 ENCOUNTER — Emergency Department (HOSPITAL_BASED_OUTPATIENT_CLINIC_OR_DEPARTMENT_OTHER): Payer: Medicare HMO

## 2019-05-07 ENCOUNTER — Encounter (HOSPITAL_BASED_OUTPATIENT_CLINIC_OR_DEPARTMENT_OTHER): Payer: Self-pay | Admitting: Emergency Medicine

## 2019-05-07 ENCOUNTER — Ambulatory Visit (INDEPENDENT_AMBULATORY_CARE_PROVIDER_SITE_OTHER): Payer: Medicare HMO | Admitting: Family Medicine

## 2019-05-07 ENCOUNTER — Emergency Department (HOSPITAL_BASED_OUTPATIENT_CLINIC_OR_DEPARTMENT_OTHER)
Admission: EM | Admit: 2019-05-07 | Discharge: 2019-05-07 | Disposition: A | Discharge status: Home / Self Care | Payer: Medicare HMO | Attending: Emergency Medicine | Admitting: Emergency Medicine

## 2019-05-07 VITALS — BP 162/88 | HR 89 | Ht 62.0 in

## 2019-05-07 DIAGNOSIS — K5909 Other constipation: Secondary | ICD-10-CM

## 2019-05-07 DIAGNOSIS — Z87891 Personal history of nicotine dependence: Secondary | ICD-10-CM | POA: Insufficient documentation

## 2019-05-07 DIAGNOSIS — N816 Rectocele: Secondary | ICD-10-CM | POA: Diagnosis not present

## 2019-05-07 DIAGNOSIS — J45909 Unspecified asthma, uncomplicated: Secondary | ICD-10-CM | POA: Insufficient documentation

## 2019-05-07 DIAGNOSIS — Z79899 Other long term (current) drug therapy: Secondary | ICD-10-CM | POA: Insufficient documentation

## 2019-05-07 DIAGNOSIS — R1011 Right upper quadrant pain: Secondary | ICD-10-CM | POA: Diagnosis not present

## 2019-05-07 DIAGNOSIS — R109 Unspecified abdominal pain: Secondary | ICD-10-CM | POA: Diagnosis not present

## 2019-05-07 DIAGNOSIS — K5900 Constipation, unspecified: Secondary | ICD-10-CM | POA: Diagnosis not present

## 2019-05-07 DIAGNOSIS — I10 Essential (primary) hypertension: Secondary | ICD-10-CM | POA: Diagnosis not present

## 2019-05-07 DIAGNOSIS — K9049 Malabsorption due to intolerance, not elsewhere classified: Secondary | ICD-10-CM | POA: Diagnosis not present

## 2019-05-07 LAB — CBC WITH DIFFERENTIAL/PLATELET
Abs Immature Granulocytes: 0.04 10*3/uL (ref 0.00–0.07)
Basophils Absolute: 0.1 10*3/uL (ref 0.0–0.1)
Basophils Relative: 1 %
Eosinophils Absolute: 0.2 10*3/uL (ref 0.0–0.5)
Eosinophils Relative: 2 %
HCT: 42.8 % (ref 36.0–46.0)
Hemoglobin: 13.5 g/dL (ref 12.0–15.0)
Immature Granulocytes: 1 %
Lymphocytes Relative: 22 %
Lymphs Abs: 1.9 10*3/uL (ref 0.7–4.0)
MCH: 28.3 pg (ref 26.0–34.0)
MCHC: 31.5 g/dL (ref 30.0–36.0)
MCV: 89.7 fL (ref 80.0–100.0)
Monocytes Absolute: 0.6 10*3/uL (ref 0.1–1.0)
Monocytes Relative: 7 %
Neutro Abs: 5.7 10*3/uL (ref 1.7–7.7)
Neutrophils Relative %: 67 %
Platelets: 233 10*3/uL (ref 150–400)
RBC: 4.77 MIL/uL (ref 3.87–5.11)
RDW: 13.4 % (ref 11.5–15.5)
WBC: 8.4 10*3/uL (ref 4.0–10.5)
nRBC: 0 % (ref 0.0–0.2)

## 2019-05-07 LAB — COMPREHENSIVE METABOLIC PANEL
ALT: 30 U/L (ref 0–44)
AST: 24 U/L (ref 15–41)
Albumin: 3.9 g/dL (ref 3.5–5.0)
Alkaline Phosphatase: 75 U/L (ref 38–126)
Anion gap: 10 (ref 5–15)
BUN: 13 mg/dL (ref 6–20)
CO2: 23 mmol/L (ref 22–32)
Calcium: 9.1 mg/dL (ref 8.9–10.3)
Chloride: 109 mmol/L (ref 98–111)
Creatinine, Ser: 0.62 mg/dL (ref 0.44–1.00)
GFR calc Af Amer: >60 mL/min (ref >60)
GFR calc non Af Amer: >60 mL/min (ref >60)
Glucose, Bld: 139 mg/dL — ABNORMAL HIGH (ref 70–99)
Potassium: 3.9 mmol/L (ref 3.5–5.1)
Sodium: 142 mmol/L (ref 135–145)
Total Bilirubin: 0.3 mg/dL (ref 0.3–1.2)
Total Protein: 7.3 g/dL (ref 6.5–8.1)

## 2019-05-07 LAB — LIPASE, BLOOD: Lipase: 35 U/L (ref 11–51)

## 2019-05-07 MED ORDER — DICYCLOMINE HCL 10 MG PO CAPS
10.0000 mg | ORAL_CAPSULE | Freq: Once | ORAL | Status: AC
  Administered 2019-05-07: 10 mg via ORAL
  Filled 2019-05-07: qty 1

## 2019-05-07 MED ORDER — DICYCLOMINE HCL 20 MG PO TABS: 20.0000 mg | ORAL_TABLET | Freq: Two times a day (BID) | ORAL | 0 refills | Status: DC | PRN

## 2019-05-07 MED ORDER — DOCUSATE SODIUM 100 MG PO CAPS: 100.0000 mg | ORAL_CAPSULE | Freq: Two times a day (BID) | ORAL | 0 refills | Status: DC | PRN

## 2019-05-07 NOTE — Assessment & Plan Note (Signed)
Recommend Mag citrate at bedtime.  Can repeat dose the following evening if needed.  She can hold her lactulose on the days that she use the mag citrate.

## 2019-05-07 NOTE — Assessment & Plan Note (Signed)
Planning on get her milk allergy testing redone soon.  She has not felt well enough to go to the lab to have it drawn.
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
## 2019-05-07 NOTE — ED Triage Notes (Signed)
Pt is c/o severe abd pain and constipation  Pt states the pain has gotten worse since last night  Pt says her pain is in her abdomen and back and states she feels distended  Pt state she has not been able to have a regular bowel movement for the past two months  Pt states she feels like she needs to go but cannot

## 2019-05-07 NOTE — Progress Notes (Signed)
Pt stated that she feels like her body is heavy and she doesn't feel well. Denies f/s/c/n/v/d.  She said she felt worse yesterday. Pain is 6/10 today and it is sharp under her ribs where her Gallbladder was. She has pain in her rectum.  She had a banana and a slice of toast around 7 am and some water. She did have somewhat of a BM this morning after eating she states that it is "flat pieces" that come out.  She thinks that she maybe backed up.Elouise Munroe, [place]

## 2019-05-07 NOTE — ED Notes (Signed)
Patient transported to X-ray 

## 2019-05-07 NOTE — Assessment & Plan Note (Signed)
We discussed that I really think she needs to be evaluated by colorectal surgeon for the rectocele it may just be to the point where it really needs correction.  It sounds like it is really obstructing her bowel movements.
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
## 2019-05-07 NOTE — Assessment & Plan Note (Signed)
Not well controlled but she is only taking half a labetolol instead of full dose.  Also needs to take her spironolactone regularly

## 2019-05-08 ENCOUNTER — Ambulatory Visit: Payer: Self-pay | Admitting: Family Medicine

## 2019-05-12 ENCOUNTER — Encounter: Payer: Self-pay | Admitting: *Deleted

## 2019-05-12 ENCOUNTER — Ambulatory Visit (INDEPENDENT_AMBULATORY_CARE_PROVIDER_SITE_OTHER): Payer: Medicare HMO | Admitting: Family Medicine

## 2019-05-12 ENCOUNTER — Encounter: Payer: Self-pay | Admitting: Family Medicine

## 2019-05-12 DIAGNOSIS — R1084 Generalized abdominal pain: Secondary | ICD-10-CM

## 2019-05-12 DIAGNOSIS — M7989 Other specified soft tissue disorders: Secondary | ICD-10-CM | POA: Diagnosis not present

## 2019-05-12 DIAGNOSIS — D508 Other iron deficiency anemias: Secondary | ICD-10-CM

## 2019-05-12 DIAGNOSIS — R600 Localized edema: Secondary | ICD-10-CM | POA: Diagnosis not present

## 2019-05-12 DIAGNOSIS — E611 Iron deficiency: Secondary | ICD-10-CM | POA: Diagnosis not present

## 2019-05-12 DIAGNOSIS — K5900 Constipation, unspecified: Secondary | ICD-10-CM | POA: Diagnosis not present

## 2019-05-12 DIAGNOSIS — N816 Rectocele: Secondary | ICD-10-CM

## 2019-05-12 DIAGNOSIS — R0789 Other chest pain: Secondary | ICD-10-CM | POA: Diagnosis not present

## 2019-05-12 DIAGNOSIS — K5909 Other constipation: Secondary | ICD-10-CM | POA: Diagnosis not present

## 2019-05-12 DIAGNOSIS — E876 Hypokalemia: Secondary | ICD-10-CM | POA: Diagnosis not present

## 2019-05-12 DIAGNOSIS — R635 Abnormal weight gain: Secondary | ICD-10-CM | POA: Diagnosis not present

## 2019-05-12 DIAGNOSIS — R109 Unspecified abdominal pain: Secondary | ICD-10-CM | POA: Diagnosis not present

## 2019-05-12 LAB — IRON,TIBC AND FERRITIN PANEL: Iron: 55

## 2019-05-12 MED ORDER — NYSTATIN 100000 UNIT/GM EX CREA: 1.0000 "application " | TOPICAL_CREAM | Freq: Two times a day (BID) | CUTANEOUS | 0 refills | Status: DC

## 2019-05-12 NOTE — Telephone Encounter (Signed)
My chart note sent regarding her normal lab results.

## 2019-05-12 NOTE — Assessment & Plan Note (Signed)
Unclear what would cause just the right leg/thigh to swell.  It does not sound consistent with a DVT.  She also felt like she was swollen into her pelvic and groin area denies any significant rash that she feels like she might still have a little bit of a yeast infection but does not want to use Diflucan.  She is willing to use a topical nystatin

## 2019-05-12 NOTE — Assessment & Plan Note (Signed)
Patient went to the lab today to have iron rechecked.  Level was right at 55 so still on the very low and.  Encouraged her to continue with an iron rich diet that this is a can be a little difficult because it can increase risk for constipation which she struggles with already.

## 2019-05-12 NOTE — Assessment & Plan Note (Signed)
Still having some intermittent right upper chest pain but I do not feel that it is cardiac.

## 2019-05-12 NOTE — Assessment & Plan Note (Signed)
She misplaced the phone number for the surgical consultation.  We will send that information to her through my chart so she can try to give them a call back and get scheduled for consultation for possible rectocele repair.
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
## 2019-05-12 NOTE — Progress Notes (Signed)
Pt reports that she has pain in groin, R thigh and pelvic area. She has been using the laxative. She is unsure if this has helped to get her cleaned out as much but she is going.  Jennifer [name], Lahoma Crocker, CMA

## 2019-05-12 NOTE — Assessment & Plan Note (Signed)
She has had some bowel movements with the mag citrate and says she was given some Colace and has been a try that.

## 2019-05-14 ENCOUNTER — Encounter: Payer: Self-pay | Admitting: Family Medicine

## 2019-05-14 DIAGNOSIS — M79644 Pain in right finger(s): Secondary | ICD-10-CM

## 2019-05-15 ENCOUNTER — Encounter: Payer: Self-pay | Admitting: Family Medicine

## 2019-05-15 NOTE — Telephone Encounter (Signed)
Yes, we can fax order. Please place it as external and route back to me. I have to complete an order form and fax it over.

## 2019-05-16 NOTE — Telephone Encounter (Signed)
Order signed.

## 2019-05-18 IMAGING — DX DG ABDOMEN ACUTE W/ 1V CHEST
5 series · 5 of 5 positions shown · non-contrast
Comparison: Chest x-ray dated January 11, 2018. Abdominal x-ray dated
June 26, 2017.

CLINICAL DATA: Abdominal pain with nausea and vomiting for the past
3 days.

EXAM:
DG ABDOMEN ACUTE W/ 1V CHEST

[chest pa]
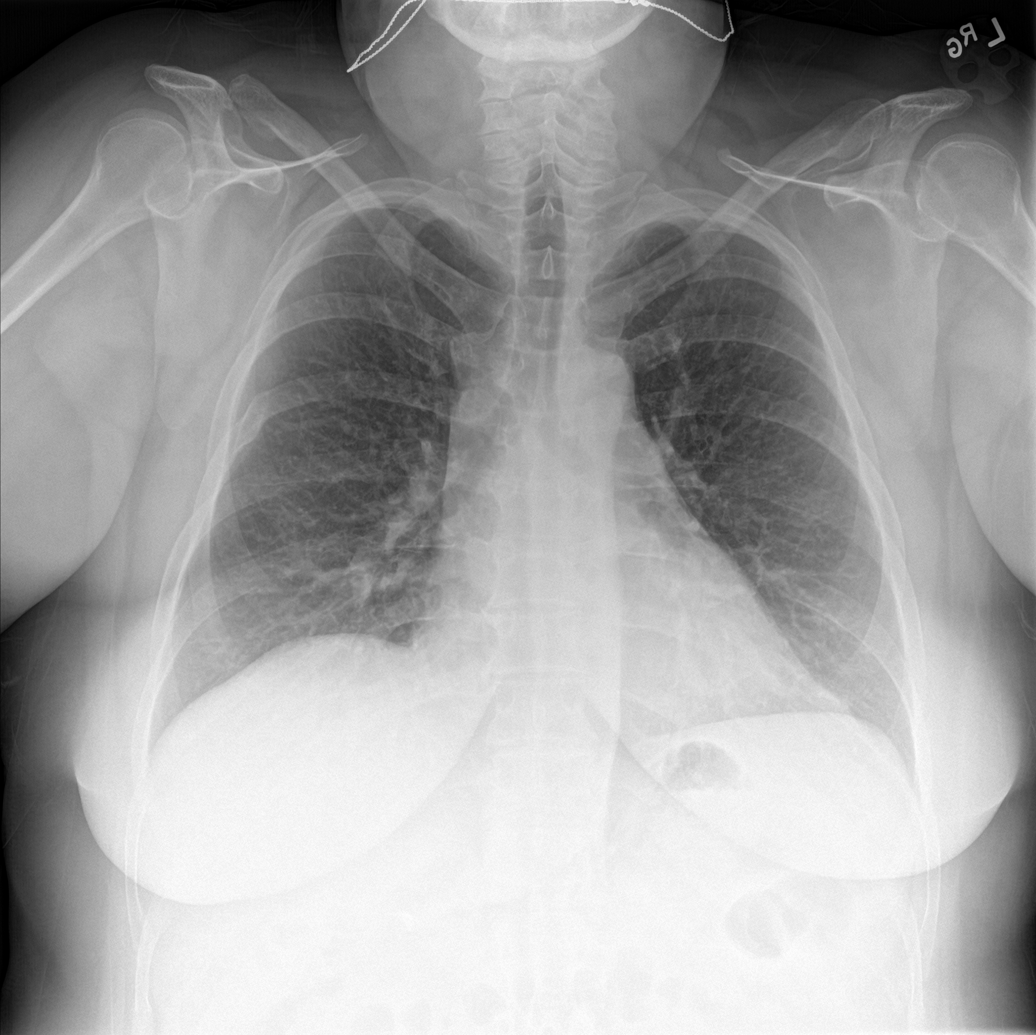

[abdomen erect]
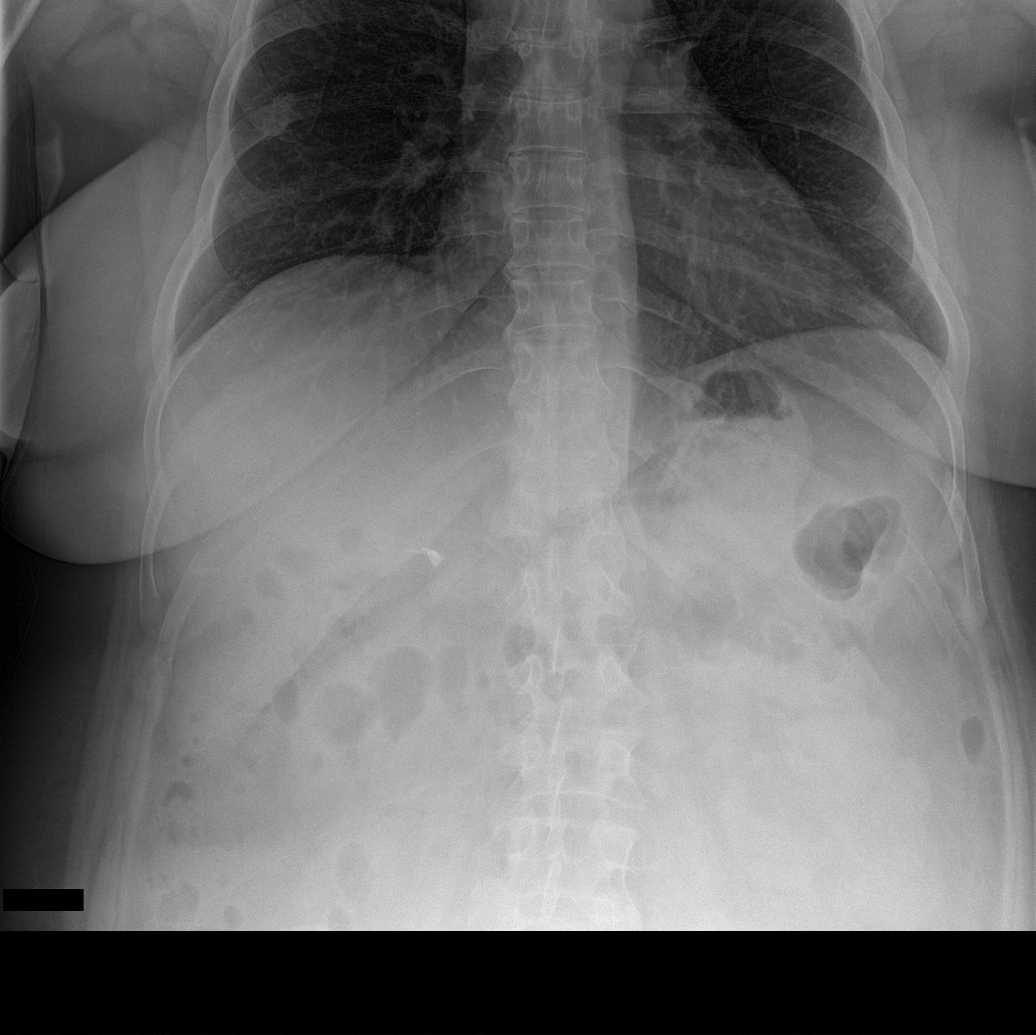

[abdomen supine (1 of 3)]
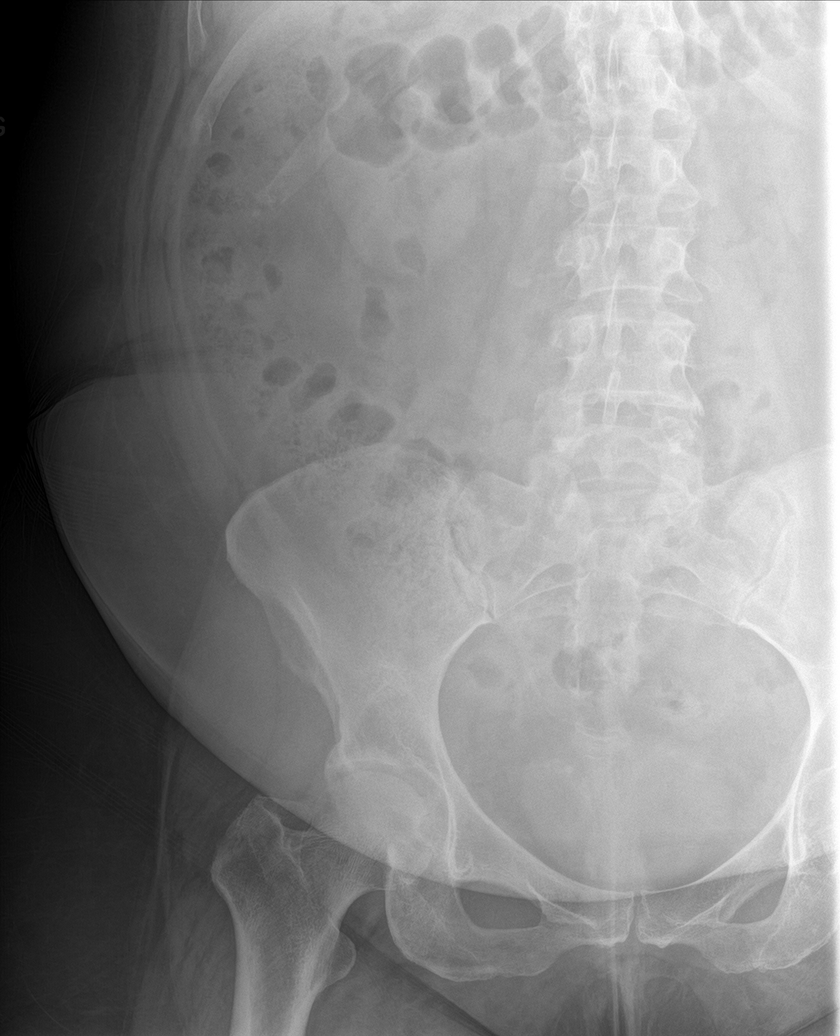

[abdomen supine (2 of 3)]
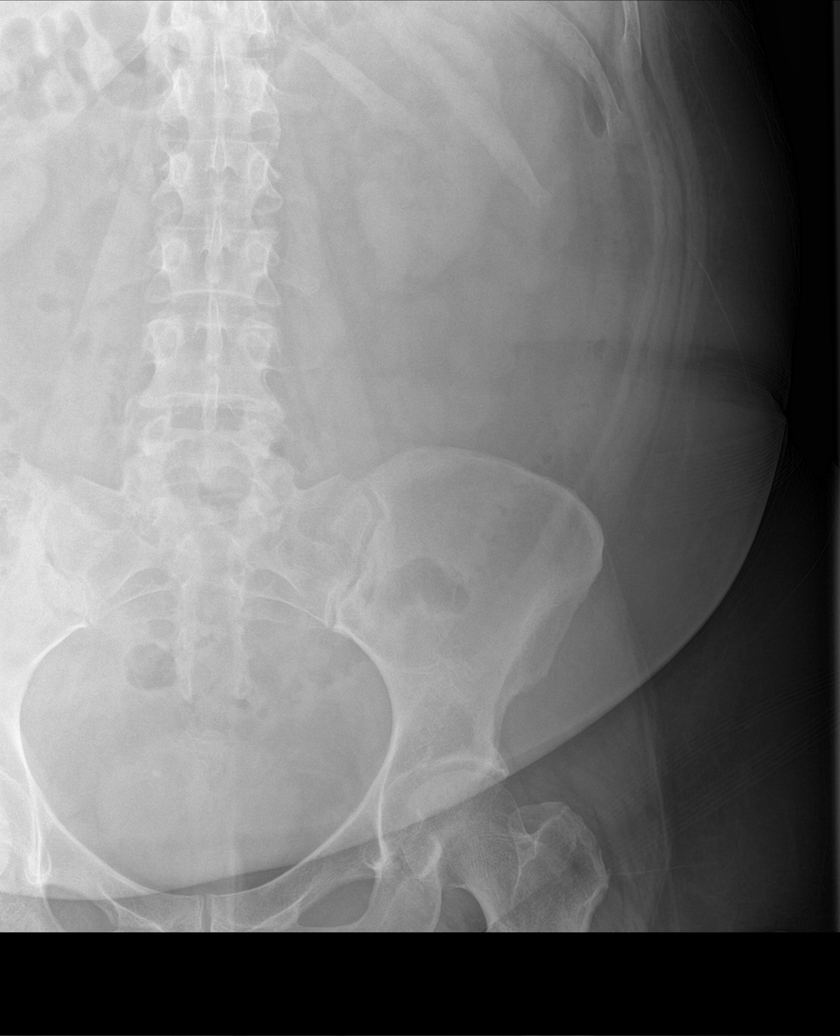

[abdomen supine (3 of 3)]
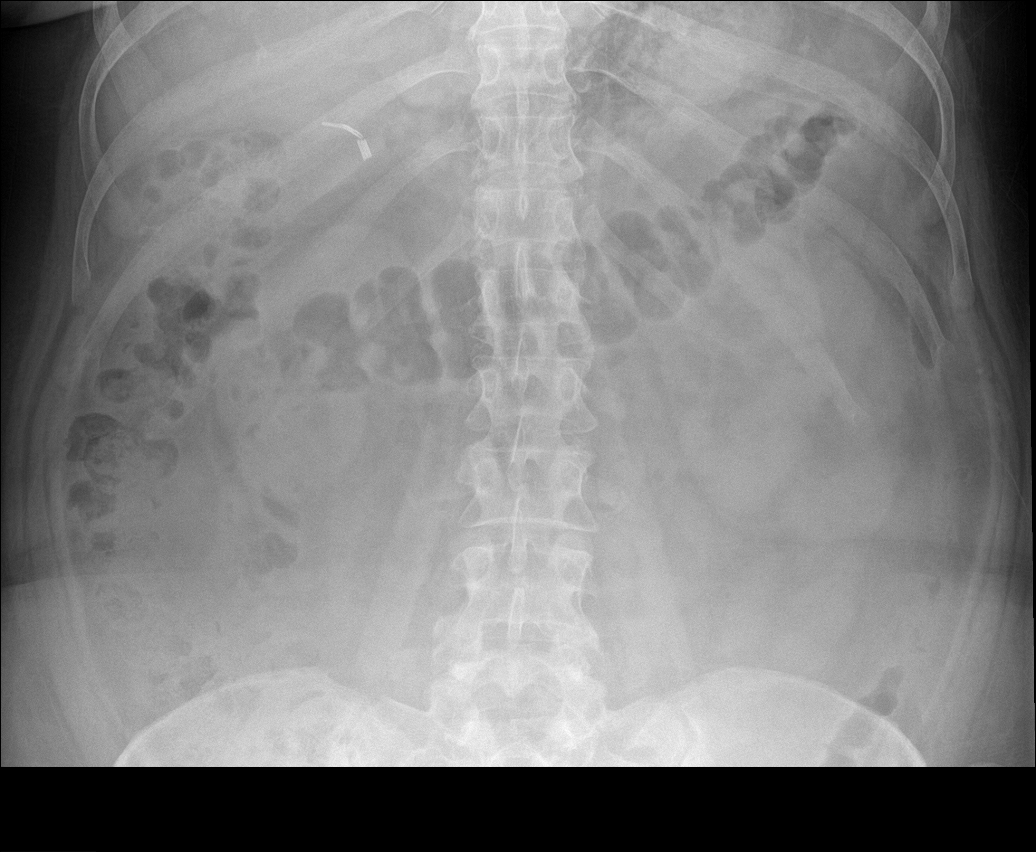

[5 of 5 positions shown; findings below may reference images not displayed]

FINDINGS: The cardiomediastinal silhouette is normal in size. Normal pulmonary
vascularity. No focal consolidation, pleural effusion, or
pneumothorax. Old right sixth rib fracture again noted.

There is no evidence of dilated bowel loops or free intraperitoneal
air. No radiopaque calculi or other significant radiographic
abnormality is seen. Prior cholecystectomy. No acute osseous
abnormality.
IMPRESSION: Negative abdominal radiographs.  No acute cardiopulmonary disease.

## 2019-05-19 ENCOUNTER — Encounter: Payer: Self-pay | Admitting: Family Medicine

## 2019-05-19 DIAGNOSIS — Z20828 Contact with and (suspected) exposure to other viral communicable diseases: Secondary | ICD-10-CM | POA: Diagnosis not present

## 2019-05-19 DIAGNOSIS — M7989 Other specified soft tissue disorders: Secondary | ICD-10-CM | POA: Diagnosis not present

## 2019-05-19 DIAGNOSIS — M79644 Pain in right finger(s): Secondary | ICD-10-CM | POA: Diagnosis not present

## 2019-05-19 DIAGNOSIS — R05 Cough: Secondary | ICD-10-CM | POA: Diagnosis not present

## 2019-05-19 DIAGNOSIS — R519 Headache, unspecified: Secondary | ICD-10-CM | POA: Diagnosis not present

## 2019-05-19 DIAGNOSIS — R0600 Dyspnea, unspecified: Secondary | ICD-10-CM | POA: Diagnosis not present

## 2019-05-19 DIAGNOSIS — Z87891 Personal history of nicotine dependence: Secondary | ICD-10-CM | POA: Diagnosis not present

## 2019-05-19 DIAGNOSIS — R0989 Other specified symptoms and signs involving the circulatory and respiratory systems: Secondary | ICD-10-CM | POA: Diagnosis not present

## 2019-05-19 NOTE — Addendum Note (Signed)
Addended by: Alena Bills R on: 05/19/2019 02:54 PM   Modules accepted: Orders

## 2019-05-20 ENCOUNTER — Ambulatory Visit (INDEPENDENT_AMBULATORY_CARE_PROVIDER_SITE_OTHER): Payer: Medicare HMO | Admitting: Family Medicine

## 2019-05-20 ENCOUNTER — Encounter: Payer: Self-pay | Admitting: Family Medicine

## 2019-05-20 DIAGNOSIS — J0100 Acute maxillary sinusitis, unspecified: Secondary | ICD-10-CM

## 2019-05-20 DIAGNOSIS — R079 Chest pain, unspecified: Secondary | ICD-10-CM | POA: Diagnosis not present

## 2019-05-20 DIAGNOSIS — R0602 Shortness of breath: Secondary | ICD-10-CM | POA: Diagnosis not present

## 2019-05-20 DIAGNOSIS — Z87891 Personal history of nicotine dependence: Secondary | ICD-10-CM | POA: Diagnosis not present

## 2019-05-20 DIAGNOSIS — R072 Precordial pain: Secondary | ICD-10-CM | POA: Diagnosis not present

## 2019-05-20 MED ORDER — DEXAMETHASONE 2 MG PO TABS: 2.0000 mg | ORAL_TABLET | Freq: Two times a day (BID) | ORAL | 0 refills | Status: DC

## 2019-05-20 MED ORDER — CLINDAMYCIN HCL 75 MG PO CAPS: 75.0000 mg | ORAL_CAPSULE | Freq: Four times a day (QID) | ORAL | 0 refills | Status: DC
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
## 2019-05-21 ENCOUNTER — Ambulatory Visit (INDEPENDENT_AMBULATORY_CARE_PROVIDER_SITE_OTHER): Payer: Medicare HMO | Admitting: Family Medicine

## 2019-05-21 ENCOUNTER — Ambulatory Visit: Payer: Medicare HMO | Admitting: Family Medicine

## 2019-05-21 ENCOUNTER — Encounter: Payer: Self-pay | Admitting: Family Medicine

## 2019-05-21 VITALS — BP 144/83 | HR 86 | Temp 97.0°F | Wt 224.0 lb

## 2019-05-21 DIAGNOSIS — R42 Dizziness and giddiness: Secondary | ICD-10-CM | POA: Diagnosis not present

## 2019-05-21 DIAGNOSIS — J0100 Acute maxillary sinusitis, unspecified: Secondary | ICD-10-CM

## 2019-05-21 DIAGNOSIS — I10 Essential (primary) hypertension: Secondary | ICD-10-CM | POA: Diagnosis not present

## 2019-05-21 DIAGNOSIS — R0602 Shortness of breath: Secondary | ICD-10-CM | POA: Diagnosis not present

## 2019-05-21 DIAGNOSIS — R0789 Other chest pain: Secondary | ICD-10-CM | POA: Diagnosis not present

## 2019-05-21 DIAGNOSIS — R079 Chest pain, unspecified: Secondary | ICD-10-CM | POA: Diagnosis not present

## 2019-05-21 DIAGNOSIS — R29898 Other symptoms and signs involving the musculoskeletal system: Secondary | ICD-10-CM

## 2019-05-21 DIAGNOSIS — R072 Precordial pain: Secondary | ICD-10-CM | POA: Diagnosis not present

## 2019-05-21 DIAGNOSIS — M79601 Pain in right arm: Secondary | ICD-10-CM | POA: Diagnosis not present

## 2019-05-21 DIAGNOSIS — N816 Rectocele: Secondary | ICD-10-CM | POA: Diagnosis not present

## 2019-05-21 DIAGNOSIS — R2231 Localized swelling, mass and lump, right upper limb: Secondary | ICD-10-CM | POA: Diagnosis not present

## 2019-05-21 NOTE — Progress Notes (Signed)
Pt stated that she is just not feeling great. She hasn't slept well. She doesn't have any questions or concerns at this time. This is her regular f/u appt.Jennifer [name], Lahoma Crocker, CMA
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
## 2019-05-22 DIAGNOSIS — E785 Hyperlipidemia, unspecified: Secondary | ICD-10-CM | POA: Diagnosis not present

## 2019-05-22 DIAGNOSIS — Z880 Allergy status to penicillin: Secondary | ICD-10-CM | POA: Diagnosis not present

## 2019-05-22 DIAGNOSIS — Z882 Allergy status to sulfonamides status: Secondary | ICD-10-CM | POA: Diagnosis not present

## 2019-05-22 DIAGNOSIS — Z881 Allergy status to other antibiotic agents status: Secondary | ICD-10-CM | POA: Diagnosis not present

## 2019-05-22 DIAGNOSIS — E119 Type 2 diabetes mellitus without complications: Secondary | ICD-10-CM | POA: Diagnosis not present

## 2019-05-22 DIAGNOSIS — R0600 Dyspnea, unspecified: Secondary | ICD-10-CM | POA: Diagnosis not present

## 2019-05-22 DIAGNOSIS — J45909 Unspecified asthma, uncomplicated: Secondary | ICD-10-CM | POA: Diagnosis not present

## 2019-05-22 DIAGNOSIS — M79601 Pain in right arm: Secondary | ICD-10-CM | POA: Diagnosis not present

## 2019-05-22 DIAGNOSIS — I10 Essential (primary) hypertension: Secondary | ICD-10-CM | POA: Diagnosis not present

## 2019-05-22 DIAGNOSIS — Z87891 Personal history of nicotine dependence: Secondary | ICD-10-CM | POA: Diagnosis not present

## 2019-05-22 DIAGNOSIS — Z888 Allergy status to other drugs, medicaments and biological substances status: Secondary | ICD-10-CM | POA: Diagnosis not present

## 2019-05-22 DIAGNOSIS — R0602 Shortness of breath: Secondary | ICD-10-CM | POA: Diagnosis not present

## 2019-05-22 DIAGNOSIS — R2231 Localized swelling, mass and lump, right upper limb: Secondary | ICD-10-CM | POA: Diagnosis not present

## 2019-05-23 ENCOUNTER — Telehealth: Payer: Self-pay

## 2019-05-23 MED ORDER — METOPROLOL TARTRATE 50 MG PO TABS: 50.0000 mg | ORAL_TABLET | Freq: Two times a day (BID) | ORAL | 0 refills | Status: DC

## 2019-05-23 MED ORDER — METOPROLOL TARTRATE 25 MG PO TABS: 25.0000 mg | ORAL_TABLET | Freq: Every day | ORAL | 0 refills | Status: DC

## 2019-05-23 NOTE — Telephone Encounter (Signed)
Patient advised.

## 2019-05-23 NOTE — Telephone Encounter (Signed)
Fine by me.  On record review, it looks like she was taking the metoprolol 50 mg and mornings and evenings and 25 mg around lunchtime?  I sent this refill, please confirm whether it is correct and let me know if any changes needed.

## 2019-05-23 NOTE — Telephone Encounter (Signed)
Jennifer [name] states the labetalol is causing shortness of breath. She would like to switch back to the Metoprolol. Please advise. She has an appointment with Cardiology on Monday.

## 2019-05-24 DIAGNOSIS — M542 Cervicalgia: Secondary | ICD-10-CM | POA: Diagnosis not present

## 2019-05-24 DIAGNOSIS — R519 Headache, unspecified: Secondary | ICD-10-CM | POA: Diagnosis not present

## 2019-05-24 DIAGNOSIS — R0602 Shortness of breath: Secondary | ICD-10-CM | POA: Diagnosis not present

## 2019-05-24 DIAGNOSIS — E876 Hypokalemia: Secondary | ICD-10-CM | POA: Diagnosis not present

## 2019-05-24 DIAGNOSIS — R05 Cough: Secondary | ICD-10-CM | POA: Diagnosis not present

## 2019-05-26 ENCOUNTER — Encounter (HOSPITAL_BASED_OUTPATIENT_CLINIC_OR_DEPARTMENT_OTHER): Payer: Self-pay | Admitting: *Deleted

## 2019-05-26 ENCOUNTER — Other Ambulatory Visit: Payer: Self-pay

## 2019-05-26 ENCOUNTER — Emergency Department (HOSPITAL_BASED_OUTPATIENT_CLINIC_OR_DEPARTMENT_OTHER)
Admission: EM | Admit: 2019-05-26 | Discharge: 2019-05-26 | Disposition: A | Discharge status: Home / Self Care | Payer: Medicare HMO | Attending: Emergency Medicine | Admitting: Emergency Medicine

## 2019-05-26 ENCOUNTER — Encounter: Payer: Self-pay | Admitting: Family Medicine

## 2019-05-26 DIAGNOSIS — Z79899 Other long term (current) drug therapy: Secondary | ICD-10-CM | POA: Insufficient documentation

## 2019-05-26 DIAGNOSIS — J45909 Unspecified asthma, uncomplicated: Secondary | ICD-10-CM | POA: Diagnosis not present

## 2019-05-26 DIAGNOSIS — Z5320 Procedure and treatment not carried out because of patient's decision for unspecified reasons: Secondary | ICD-10-CM | POA: Diagnosis not present

## 2019-05-26 DIAGNOSIS — I10 Essential (primary) hypertension: Secondary | ICD-10-CM | POA: Diagnosis not present

## 2019-05-26 DIAGNOSIS — R002 Palpitations: Secondary | ICD-10-CM | POA: Diagnosis not present

## 2019-05-26 DIAGNOSIS — Z87891 Personal history of nicotine dependence: Secondary | ICD-10-CM | POA: Diagnosis not present

## 2019-05-26 DIAGNOSIS — R0602 Shortness of breath: Secondary | ICD-10-CM | POA: Diagnosis not present

## 2019-05-26 DIAGNOSIS — I471 Supraventricular tachycardia: Secondary | ICD-10-CM | POA: Diagnosis not present

## 2019-05-26 DIAGNOSIS — E876 Hypokalemia: Secondary | ICD-10-CM | POA: Diagnosis not present

## 2019-05-26 DIAGNOSIS — R519 Headache, unspecified: Secondary | ICD-10-CM | POA: Diagnosis not present
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
## 2019-05-26 NOTE — ED Triage Notes (Signed)
Pt c/o palpitations x 2 days
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
## 2019-05-29 DIAGNOSIS — I10 Essential (primary) hypertension: Secondary | ICD-10-CM | POA: Diagnosis not present

## 2019-05-29 DIAGNOSIS — R9431 Abnormal electrocardiogram [ECG] [EKG]: Secondary | ICD-10-CM | POA: Diagnosis not present

## 2019-05-29 DIAGNOSIS — Y999 Unspecified external cause status: Secondary | ICD-10-CM | POA: Diagnosis not present

## 2019-05-29 DIAGNOSIS — E559 Vitamin D deficiency, unspecified: Secondary | ICD-10-CM | POA: Diagnosis not present

## 2019-05-29 DIAGNOSIS — E876 Hypokalemia: Secondary | ICD-10-CM | POA: Diagnosis not present

## 2019-05-29 DIAGNOSIS — E2609 Other primary hyperaldosteronism: Secondary | ICD-10-CM | POA: Diagnosis not present

## 2019-05-29 DIAGNOSIS — R079 Chest pain, unspecified: Secondary | ICD-10-CM | POA: Diagnosis not present

## 2019-05-29 DIAGNOSIS — X58XXXA Exposure to other specified factors, initial encounter: Secondary | ICD-10-CM | POA: Diagnosis not present

## 2019-05-29 DIAGNOSIS — T461X5A Adverse effect of calcium-channel blockers, initial encounter: Secondary | ICD-10-CM | POA: Diagnosis not present

## 2019-05-29 DIAGNOSIS — E669 Obesity, unspecified: Secondary | ICD-10-CM | POA: Diagnosis not present

## 2019-05-29 DIAGNOSIS — E860 Dehydration: Secondary | ICD-10-CM | POA: Diagnosis not present

## 2019-05-29 DIAGNOSIS — G4733 Obstructive sleep apnea (adult) (pediatric): Secondary | ICD-10-CM | POA: Diagnosis not present

## 2019-05-29 DIAGNOSIS — R000 Tachycardia, unspecified: Secondary | ICD-10-CM | POA: Diagnosis not present

## 2019-05-30 ENCOUNTER — Encounter: Payer: Self-pay | Admitting: Family Medicine

## 2019-05-30 ENCOUNTER — Ambulatory Visit: Payer: Medicare HMO | Admitting: Family Medicine

## 2019-05-30 DIAGNOSIS — Y999 Unspecified external cause status: Secondary | ICD-10-CM | POA: Diagnosis not present

## 2019-05-30 DIAGNOSIS — R002 Palpitations: Secondary | ICD-10-CM | POA: Diagnosis not present

## 2019-05-30 DIAGNOSIS — E860 Dehydration: Secondary | ICD-10-CM | POA: Diagnosis not present

## 2019-05-30 DIAGNOSIS — T461X5A Adverse effect of calcium-channel blockers, initial encounter: Secondary | ICD-10-CM | POA: Diagnosis not present

## 2019-05-30 DIAGNOSIS — R079 Chest pain, unspecified: Secondary | ICD-10-CM | POA: Diagnosis not present

## 2019-05-30 DIAGNOSIS — R000 Tachycardia, unspecified: Secondary | ICD-10-CM | POA: Diagnosis not present

## 2019-05-30 DIAGNOSIS — X58XXXA Exposure to other specified factors, initial encounter: Secondary | ICD-10-CM | POA: Diagnosis not present

## 2019-05-30 NOTE — Telephone Encounter (Signed)
Pt scheduled for Monday and Friday. She is in ED now for eval.
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
## 2019-06-02 ENCOUNTER — Encounter: Payer: Self-pay | Admitting: Family Medicine

## 2019-06-02 ENCOUNTER — Ambulatory Visit (INDEPENDENT_AMBULATORY_CARE_PROVIDER_SITE_OTHER): Payer: Medicare HMO | Admitting: Family Medicine

## 2019-06-02 VITALS — BP 142/83 | HR 90

## 2019-06-02 DIAGNOSIS — R0789 Other chest pain: Secondary | ICD-10-CM

## 2019-06-02 DIAGNOSIS — I10 Essential (primary) hypertension: Secondary | ICD-10-CM | POA: Diagnosis not present
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
## 2019-06-02 NOTE — Progress Notes (Signed)
She reports that she still feels like "crap". She said that she was seen in the ED 2x for this (Diltiazem 60 mg). Because of having CP  She stated that her HR was in the 130's. She stated that she went by the  the cardiologist office to inform them of what was going on. She wasn't seen but they did place her heart monitor. She said that    She spoke w/nephrology and they told her to let the ED change her meds. She

## 2019-06-03 MED ORDER — NEBIVOLOL HCL 5 MG PO TABS: ORAL_TABLET | ORAL | 0 refills | Status: DC

## 2019-06-04 ENCOUNTER — Encounter: Payer: Self-pay | Admitting: Family Medicine

## 2019-06-04 NOTE — Assessment & Plan Note (Signed)
Uncontrolled, though she is back on her beta-blocker which helped slightly some unfortunately she just feels a little dizzy and lightheaded on it.  She does not feel short of breath like she does labetalol so that is reassuring.  We discussed maybe trying a newer beta-blocker such as Bystolic which traditionally tends to have fewer side effects than the traditional beta-blockers.  I think it is at least worth trying she said she tried it years ago when it first came on the market but is willing to do so again.  Recommend she start with a half a tab for the first 3 days just to make sure that she is can tolerate it this would be in place of her metoprolol and then go up to a whole tab we will need to titrate her dose until her blood pressure is controlled.

## 2019-06-05 DIAGNOSIS — R519 Headache, unspecified: Secondary | ICD-10-CM | POA: Diagnosis not present

## 2019-06-05 DIAGNOSIS — I10 Essential (primary) hypertension: Secondary | ICD-10-CM | POA: Diagnosis not present

## 2019-06-05 DIAGNOSIS — Z20828 Contact with and (suspected) exposure to other viral communicable diseases: Secondary | ICD-10-CM | POA: Diagnosis not present

## 2019-06-05 DIAGNOSIS — R112 Nausea with vomiting, unspecified: Secondary | ICD-10-CM | POA: Diagnosis not present

## 2019-06-05 DIAGNOSIS — R69 Illness, unspecified: Secondary | ICD-10-CM | POA: Diagnosis not present

## 2019-06-05 DIAGNOSIS — Z87891 Personal history of nicotine dependence: Secondary | ICD-10-CM | POA: Diagnosis not present

## 2019-06-05 DIAGNOSIS — R9431 Abnormal electrocardiogram [ECG] [EKG]: Secondary | ICD-10-CM | POA: Diagnosis not present

## 2019-06-05 DIAGNOSIS — R0789 Other chest pain: Secondary | ICD-10-CM | POA: Diagnosis not present

## 2019-06-05 DIAGNOSIS — R0602 Shortness of breath: Secondary | ICD-10-CM | POA: Diagnosis not present

## 2019-06-06 ENCOUNTER — Other Ambulatory Visit: Payer: Self-pay

## 2019-06-06 ENCOUNTER — Ambulatory Visit (INDEPENDENT_AMBULATORY_CARE_PROVIDER_SITE_OTHER): Payer: Medicare HMO | Admitting: Family Medicine

## 2019-06-06 ENCOUNTER — Encounter: Payer: Self-pay | Admitting: Family Medicine

## 2019-06-06 VITALS — BP 139/87 | HR 91 | Ht 62.0 in | Wt 220.0 lb

## 2019-06-06 DIAGNOSIS — R1111 Vomiting without nausea: Secondary | ICD-10-CM | POA: Diagnosis not present

## 2019-06-06 DIAGNOSIS — M25552 Pain in left hip: Secondary | ICD-10-CM

## 2019-06-06 DIAGNOSIS — I10 Essential (primary) hypertension: Secondary | ICD-10-CM

## 2019-06-06 DIAGNOSIS — M7062 Trochanteric bursitis, left hip: Secondary | ICD-10-CM

## 2019-06-06 DIAGNOSIS — R9431 Abnormal electrocardiogram [ECG] [EKG]: Secondary | ICD-10-CM | POA: Diagnosis not present

## 2019-06-06 MED ORDER — MICONAZOLE NITRATE 200 MG VA SUPP: 200.0000 mg | Freq: Every day | VAGINAL | 3 refills | Status: DC
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
## 2019-06-06 NOTE — Patient Instructions (Addendum)
Your left hip pain is most consistent with trochanteric bursitis.  This is very common in women who are over the age of 91.  The treatment is physical therapy.  Majority of people improve with physical therapy alone.  But if after 3 to 4 weeks of home exercises you are not noticing some improvement then recommend come back in for further evaluation and possible injection.  I would like for you to keep a blood pressure log.  Please note which medicines you have taken that day or if you have forgotten to take them or took them particularly late.  And please check your blood pressure only once per day and write that down as well.  I would encourage you to take a half a tab daily of the spironolactone with your metoprolol or the Bystolic if you decide to go ahead and start that which I would not.  So you can take the Bystolic as well as the spironolactone together daily.  Would like to see her blood pressure log when you come back in.

## 2019-06-10 NOTE — Assessment & Plan Note (Addendum)
Just picked up the Bystolic today so I really would like for her to give that a try in the place of the metoprolol.  We will evaluate next time I speak with her she will have an idea of how she feels on it.  Encouraged her to take the spironolactone daily and to keep a log.  please bring log to next appt

## 2019-06-11 DIAGNOSIS — K449 Diaphragmatic hernia without obstruction or gangrene: Secondary | ICD-10-CM | POA: Diagnosis not present

## 2019-06-11 DIAGNOSIS — R1310 Dysphagia, unspecified: Secondary | ICD-10-CM | POA: Diagnosis not present

## 2019-06-11 DIAGNOSIS — J029 Acute pharyngitis, unspecified: Secondary | ICD-10-CM | POA: Diagnosis not present

## 2019-06-13 ENCOUNTER — Ambulatory Visit (INDEPENDENT_AMBULATORY_CARE_PROVIDER_SITE_OTHER): Payer: Medicare HMO | Admitting: Family Medicine

## 2019-06-13 ENCOUNTER — Encounter: Payer: Self-pay | Admitting: Family Medicine

## 2019-06-13 VITALS — BP 135/90 | HR 84

## 2019-06-13 DIAGNOSIS — E876 Hypokalemia: Secondary | ICD-10-CM | POA: Diagnosis not present

## 2019-06-13 DIAGNOSIS — M542 Cervicalgia: Secondary | ICD-10-CM

## 2019-06-13 DIAGNOSIS — G8929 Other chronic pain: Secondary | ICD-10-CM | POA: Diagnosis not present

## 2019-06-13 DIAGNOSIS — R519 Headache, unspecified: Secondary | ICD-10-CM | POA: Diagnosis not present

## 2019-06-13 DIAGNOSIS — F418 Other specified anxiety disorders: Secondary | ICD-10-CM

## 2019-06-13 DIAGNOSIS — R69 Illness, unspecified: Secondary | ICD-10-CM | POA: Diagnosis not present

## 2019-06-13 DIAGNOSIS — F411 Generalized anxiety disorder: Secondary | ICD-10-CM | POA: Diagnosis not present

## 2019-06-13 LAB — COMPREHENSIVE METABOLIC PANEL: Calcium: 8.9 (ref 8.7–10.7)

## 2019-06-13 LAB — BASIC METABOLIC PANEL
BUN: 12 (ref 4–21)
CO2: 26 — AB (ref 13–22)
Chloride: 109 — AB (ref 99–108)
Creatinine: 0.6 (ref 0.5–1.1)
Glucose: 123
Potassium: 3.6 (ref 3.4–5.3)
Sodium: 140 (ref 137–147)

## 2019-06-13 NOTE — Patient Instructions (Signed)
Cervical Strain and Sprain Rehab Ask your health care provider which exercises are safe for you. Do exercises exactly as told by your health care provider and adjust them as directed. It is normal to feel mild stretching, pulling, tightness, or discomfort as you do these exercises. Stop right away if you feel sudden pain or your pain gets worse. Do not begin these exercises until told by your health care provider. Stretching and range-of-motion exercises Cervical side bending  1. Using good posture, sit on a stable chair or stand up. 2. Without moving your shoulders, slowly tilt your left / right ear to your shoulder until you feel a stretch in the opposite side neck muscles. You should be looking straight ahead. 3. Hold for __________ seconds. 4. Repeat with the other side of your neck. Repeat __________ times. Complete this exercise __________ times a day. Cervical rotation  1. Using good posture, sit on a stable chair or stand up. 2. Slowly turn your head to the side as if you are looking over your left / right shoulder. ? Keep your eyes level with the ground. ? Stop when you feel a stretch along the side and the back of your neck. 3. Hold for __________ seconds. 4. Repeat this by turning to your other side. Repeat __________ times. Complete this exercise __________ times a day. Thoracic extension and pectoral stretch 1. Roll a towel or a small blanket so it is about 4 inches (10 cm) in diameter. 2. Lie down on your back on a firm surface. 3. Put the towel lengthwise, under your spine in the middle of your back. It should not be under your shoulder blades. The towel should line up with your spine from your middle back to your lower back. 4. Put your hands behind your head and let your elbows fall out to your sides. 5. Hold for __________ seconds. Repeat __________ times. Complete this exercise __________ times a day. Strengthening exercises Isometric upper cervical flexion 1. Lie on  your back with a thin pillow behind your head and a small rolled-up towel under your neck. 2. Gently tuck your chin toward your chest and nod your head down to look toward your feet. Do not lift your head off the pillow. 3. Hold for __________ seconds. 4. Release the tension slowly. Relax your neck muscles completely before you repeat this exercise. Repeat __________ times. Complete this exercise __________ times a day. Isometric cervical extension  1. Stand about 6 inches (15 cm) away from a wall, with your back facing the wall. 2. Place a soft object, about 6-8 inches (15-20 cm) in diameter, between the back of your head and the wall. A soft object could be a small pillow, a ball, or a folded towel. 3. Gently tilt your head back and press into the soft object. Keep your jaw and forehead relaxed. 4. Hold for __________ seconds. 5. Release the tension slowly. Relax your neck muscles completely before you repeat this exercise. Repeat __________ times. Complete this exercise __________ times a day. Posture and body mechanics Body mechanics refers to the movements and positions of your body while you do your daily activities. Posture is part of body mechanics. Good posture and healthy body mechanics can help to relieve stress in your body's tissues and joints. Good posture means that your spine is in its natural S-curve position (your spine is neutral), your shoulders are pulled back slightly, and your head is not tipped forward. The following are general guidelines for applying improved   posture and body mechanics to your everyday activities. Sitting  1. When sitting, keep your spine neutral and keep your feet flat on the floor. Use a footrest, if necessary, and keep your thighs parallel to the floor. Avoid rounding your shoulders, and avoid tilting your head forward. 2. When working at a desk or a computer, keep your desk at a height where your hands are slightly lower than your elbows. Slide your  chair under your desk so you are close enough to maintain good posture. 3. When working at a computer, place your monitor at a height where you are looking straight ahead and you do not have to tilt your head forward or downward to look at the screen. Standing   When standing, keep your spine neutral and keep your feet about hip-width apart. Keep a slight bend in your knees. Your ears, shoulders, and hips should line up.  When you do a task in which you stand in one place for a long time, place one foot up on a stable object that is 2-4 inches (5-10 cm) high, such as a footstool. This helps keep your spine neutral. Resting When lying down and resting, avoid positions that are most painful for you. Try to support your neck in a neutral position. You can use a contour pillow or a small rolled-up towel. Your pillow should support your neck but not push on it. This information is not intended to replace advice given to you by your health care provider. Make sure you discuss any questions you have with your health care provider. Document Released: 07/31/2005 Document Revised: 11/20/2018 Document Reviewed: 05/01/2018 [employer]vier Patient Education  2020 [employer]  
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
## 2019-06-13 NOTE — Progress Notes (Signed)
She was told to stay on the metroprolol until she gets the heart monitor.   She stated that she feels really tired.  She said that the headaches have been moving around.  She is unsure if it could be due to the medication or vertigo, or if the pain is coming from her neck. She stated that she just feels off like she's in the "Matrix"   She stated that she has been using the Nasonex for her ear problem.  Maryruth Eve, Lahoma Crocker, CMA

## 2019-06-16 NOTE — Assessment & Plan Note (Signed)
Anxiety levels are high right now.  Would benefit from therapy.

## 2019-06-16 NOTE — Assessment & Plan Note (Signed)
Encouraged her to schedule app with a therapist for further workup.

## 2019-06-17 DIAGNOSIS — R1310 Dysphagia, unspecified: Secondary | ICD-10-CM | POA: Diagnosis not present

## 2019-06-17 DIAGNOSIS — Z8709 Personal history of other diseases of the respiratory system: Secondary | ICD-10-CM | POA: Diagnosis not present

## 2019-06-17 DIAGNOSIS — R0989 Other specified symptoms and signs involving the circulatory and respiratory systems: Secondary | ICD-10-CM | POA: Diagnosis not present

## 2019-06-17 DIAGNOSIS — R490 Dysphonia: Secondary | ICD-10-CM | POA: Diagnosis not present

## 2019-06-17 DIAGNOSIS — R1313 Dysphagia, pharyngeal phase: Secondary | ICD-10-CM | POA: Diagnosis not present

## 2019-06-20 ENCOUNTER — Ambulatory Visit: Payer: Medicare HMO | Admitting: Family Medicine

## 2019-06-20 ENCOUNTER — Other Ambulatory Visit: Payer: Self-pay

## 2019-06-20 ENCOUNTER — Ambulatory Visit (INDEPENDENT_AMBULATORY_CARE_PROVIDER_SITE_OTHER): Payer: Medicare HMO | Admitting: Family Medicine

## 2019-06-20 VITALS — BP 149/83 | HR 112 | Ht 62.0 in

## 2019-06-20 DIAGNOSIS — T17308D Unspecified foreign body in larynx causing other injury, subsequent encounter: Secondary | ICD-10-CM

## 2019-06-20 DIAGNOSIS — M25552 Pain in left hip: Secondary | ICD-10-CM | POA: Diagnosis not present

## 2019-06-20 DIAGNOSIS — R0789 Other chest pain: Secondary | ICD-10-CM

## 2019-06-20 DIAGNOSIS — G8929 Other chronic pain: Secondary | ICD-10-CM | POA: Diagnosis not present

## 2019-06-20 DIAGNOSIS — R0602 Shortness of breath: Secondary | ICD-10-CM | POA: Diagnosis not present

## 2019-06-20 DIAGNOSIS — M542 Cervicalgia: Secondary | ICD-10-CM

## 2019-06-20 DIAGNOSIS — I10 Essential (primary) hypertension: Secondary | ICD-10-CM

## 2019-06-20 DIAGNOSIS — R55 Syncope and collapse: Secondary | ICD-10-CM | POA: Diagnosis not present

## 2019-06-20 DIAGNOSIS — I471 Supraventricular tachycardia: Secondary | ICD-10-CM | POA: Diagnosis not present

## 2019-06-20 DIAGNOSIS — R61 Generalized hyperhidrosis: Secondary | ICD-10-CM | POA: Diagnosis not present

## 2019-06-20 DIAGNOSIS — R69 Illness, unspecified: Secondary | ICD-10-CM | POA: Diagnosis not present

## 2019-06-20 DIAGNOSIS — R05 Cough: Secondary | ICD-10-CM | POA: Diagnosis not present

## 2019-06-20 DIAGNOSIS — L301 Dyshidrosis [pompholyx]: Secondary | ICD-10-CM

## 2019-06-20 DIAGNOSIS — I4719 Other supraventricular tachycardia: Secondary | ICD-10-CM

## 2019-06-20 DIAGNOSIS — R1010 Upper abdominal pain, unspecified: Secondary | ICD-10-CM | POA: Diagnosis not present

## 2019-06-20 MED ORDER — TRIAMCINOLONE 0.1 % CREAM:EUCERIN CREAM 1:1: 1.0000 "application " | TOPICAL_CREAM | Freq: Every evening | CUTANEOUS | 1 refills | Status: DC | PRN

## 2019-06-20 MED ORDER — METOPROLOL TARTRATE 25 MG PO TABS: 25.0000 mg | ORAL_TABLET | Freq: Every day | ORAL | 0 refills | Status: DC
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
## 2019-06-24 ENCOUNTER — Encounter: Payer: Self-pay | Admitting: Family Medicine

## 2019-06-24 DIAGNOSIS — M542 Cervicalgia: Secondary | ICD-10-CM | POA: Insufficient documentation

## 2019-06-24 DIAGNOSIS — L301 Dyshidrosis [pompholyx]: Secondary | ICD-10-CM | POA: Insufficient documentation

## 2019-06-24 NOTE — Assessment & Plan Note (Signed)
Currently wearing a heart monitor.  So far she is actually been tolerating it well.  In the past she has had significant problems and breakouts with the adhesive.  But this seems to be working well.  Currently on half a tab of metoprolol we will hold off on switching to Bystolic until she is done wearing the heart monitor.

## 2019-06-24 NOTE — Assessment & Plan Note (Signed)
Feeling the rash on her feet is most consistent with dyshidrotic eczema.  She has been treated with antifungals in the past and really has not responded and says she did not get a lot of relief with the Cereve some been a send over prescription for combination compounded Cetaphil/triamcinolone cream to apply and we will see if this helps.

## 2019-06-24 NOTE — Assessment & Plan Note (Signed)
Recommend formal physical therapy.  We will try to refer to [facility] which is a much closer drive for her.  We will try to see if we can get it close to [facility].

## 2019-06-24 NOTE — Assessment & Plan Note (Signed)
Blood pressure not at goal today.  Currently on a half a tab metoprolol.  I really would like for her to try the Bystolic when she completes her heart monitor.  Just encouraged her to make sure she is taking her spironolactone daily.  Once again we discussed keeping a diary of blood pressures and checking it only once or at the most twice a day.

## 2019-06-24 NOTE — Assessment & Plan Note (Signed)
I really believe that this is stress related.  She has been evaluated in the emergency department several times

## 2019-06-25 ENCOUNTER — Telehealth: Payer: Self-pay | Admitting: Family Medicine

## 2019-06-25 NOTE — Telephone Encounter (Signed)
Patient called in and was very rude to me and she is wanting to have a spirometry on Friday before her appointment. I let her know the only thing open is a 09:00 am and she is not wanting to come in for the spirometry that early and then have to come back in the afternoon. She is requesting that Kenney Houseman do the test since she was not able to get in with allergy and asthma this week. I let her know that you are busy and have a full schedule and this would be hard to complete in the slot she is in since we allow at least 30 min for those appointments.  Please advise.

## 2019-06-25 NOTE — Telephone Encounter (Signed)
Okay.  We can place on the 9 AM schedule for spirometry in the morning with the nurse and then I will see her as soon as she is done.  That way she does not have to come back in the afternoon.

## 2019-06-25 NOTE — Telephone Encounter (Signed)
Left a brief VM with PCP recommendations and patient was asked to call back with any questions.

## 2019-06-26 ENCOUNTER — Telehealth: Payer: Self-pay | Admitting: Family Medicine

## 2019-06-26 ENCOUNTER — Encounter: Payer: Self-pay | Admitting: Family Medicine

## 2019-06-26 DIAGNOSIS — M549 Dorsalgia, unspecified: Secondary | ICD-10-CM

## 2019-06-26 DIAGNOSIS — R69 Illness, unspecified: Secondary | ICD-10-CM | POA: Diagnosis not present

## 2019-06-26 DIAGNOSIS — M542 Cervicalgia: Secondary | ICD-10-CM

## 2019-06-26 DIAGNOSIS — G894 Chronic pain syndrome: Secondary | ICD-10-CM | POA: Diagnosis not present

## 2019-06-26 DIAGNOSIS — R140 Abdominal distension (gaseous): Secondary | ICD-10-CM | POA: Diagnosis not present

## 2019-06-26 DIAGNOSIS — R6881 Early satiety: Secondary | ICD-10-CM | POA: Diagnosis not present

## 2019-06-26 LAB — LAB REPORT - SCANNED
ALDOSTERONE: 16
Casein IgE: 0.35
Milk IgE: 0.35

## 2019-06-26 NOTE — Telephone Encounter (Signed)
Dr. Jarvis Newcomer called and wants to know if you will change her PT referral for PT on her neck and entire spine.   [name]

## 2019-06-26 NOTE — Telephone Encounter (Signed)
Patient needs an appointment for next Friday, 11/20. She states she needs after 2pm and only on fridays. She was on the scheduele for a 20 min slot. Now she is saying she is fine with a 20 min as well unless PCP thinks that she needs to be seen for more time. Please advise so I can put patient in a slot for next Friday. Not show an appropriate slot for next week.

## 2019-06-27 ENCOUNTER — Ambulatory Visit: Payer: Medicare HMO | Admitting: Family Medicine

## 2019-06-27 DIAGNOSIS — R1013 Epigastric pain: Secondary | ICD-10-CM | POA: Diagnosis not present

## 2019-06-27 DIAGNOSIS — R6881 Early satiety: Secondary | ICD-10-CM | POA: Diagnosis not present

## 2019-06-27 DIAGNOSIS — K581 Irritable bowel syndrome with constipation: Secondary | ICD-10-CM | POA: Diagnosis not present

## 2019-06-27 DIAGNOSIS — R1110 Vomiting, unspecified: Secondary | ICD-10-CM | POA: Diagnosis not present

## 2019-06-27 NOTE — Telephone Encounter (Signed)
PCP is working on getting her scheduled for [employer].

## 2019-06-27 NOTE — Telephone Encounter (Signed)
Pt was scheduled for 40 mi slot

## 2019-06-27 NOTE — Telephone Encounter (Signed)
New PT referral placed.Jennifer [name], [place]

## 2019-06-29 DIAGNOSIS — R9431 Abnormal electrocardiogram [ECG] [EKG]: Secondary | ICD-10-CM | POA: Diagnosis not present

## 2019-07-03 DIAGNOSIS — R110 Nausea: Secondary | ICD-10-CM | POA: Diagnosis not present

## 2019-07-03 DIAGNOSIS — R000 Tachycardia, unspecified: Secondary | ICD-10-CM | POA: Diagnosis not present

## 2019-07-03 DIAGNOSIS — R0981 Nasal congestion: Secondary | ICD-10-CM | POA: Diagnosis not present

## 2019-07-03 DIAGNOSIS — R109 Unspecified abdominal pain: Secondary | ICD-10-CM | POA: Diagnosis not present

## 2019-07-03 DIAGNOSIS — J3489 Other specified disorders of nose and nasal sinuses: Secondary | ICD-10-CM | POA: Diagnosis not present

## 2019-07-03 DIAGNOSIS — Z87891 Personal history of nicotine dependence: Secondary | ICD-10-CM | POA: Diagnosis not present

## 2019-07-03 DIAGNOSIS — J069 Acute upper respiratory infection, unspecified: Secondary | ICD-10-CM | POA: Diagnosis not present

## 2019-07-03 DIAGNOSIS — H9202 Otalgia, left ear: Secondary | ICD-10-CM | POA: Diagnosis not present

## 2019-07-03 DIAGNOSIS — R69 Illness, unspecified: Secondary | ICD-10-CM | POA: Diagnosis not present

## 2019-07-04 ENCOUNTER — Encounter: Payer: Self-pay | Admitting: Family Medicine

## 2019-07-04 ENCOUNTER — Ambulatory Visit (INDEPENDENT_AMBULATORY_CARE_PROVIDER_SITE_OTHER): Payer: Medicare HMO | Admitting: Family Medicine

## 2019-07-04 ENCOUNTER — Other Ambulatory Visit: Payer: Self-pay

## 2019-07-04 VITALS — BP 155/76 | HR 105 | Ht 62.0 in | Wt 221.0 lb

## 2019-07-04 DIAGNOSIS — R079 Chest pain, unspecified: Secondary | ICD-10-CM

## 2019-07-04 DIAGNOSIS — M542 Cervicalgia: Secondary | ICD-10-CM

## 2019-07-04 DIAGNOSIS — I10 Essential (primary) hypertension: Secondary | ICD-10-CM

## 2019-07-04 DIAGNOSIS — L299 Pruritus, unspecified: Secondary | ICD-10-CM | POA: Diagnosis not present

## 2019-07-04 DIAGNOSIS — J0100 Acute maxillary sinusitis, unspecified: Secondary | ICD-10-CM

## 2019-07-04 MED ORDER — DOXYCYCLINE HYCLATE 50 MG PO CAPS: 50.0000 mg | ORAL_CAPSULE | Freq: Three times a day (TID) | ORAL | 0 refills | Status: DC

## 2019-07-04 MED ORDER — AZELASTINE HCL 0.1 % NA SOLN: 2.0000 | Freq: Two times a day (BID) | NASAL | 3 refills | Status: DC
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
## 2019-07-04 NOTE — Patient Instructions (Signed)
Recommend trying over-the-counter Scalpicin.  It actually has a hydrocortisone ingredient in it which can help with scalp itching.  He can apply daily to the scalp after your shower when your hair and scalp is still wet.  Starting Rogaine for women can take up to 3 months to start to see improvement.  It only works while you use it so if you discontinue it and you will go back to losing hair again.

## 2019-07-07 NOTE — Assessment & Plan Note (Signed)
Blood pressure not well controlled but patient currently following with hypertension clinic.

## 2019-07-08 ENCOUNTER — Encounter (HOSPITAL_BASED_OUTPATIENT_CLINIC_OR_DEPARTMENT_OTHER): Payer: Self-pay

## 2019-07-08 ENCOUNTER — Emergency Department (HOSPITAL_BASED_OUTPATIENT_CLINIC_OR_DEPARTMENT_OTHER): Payer: Medicare HMO

## 2019-07-08 ENCOUNTER — Other Ambulatory Visit: Payer: Self-pay

## 2019-07-08 ENCOUNTER — Encounter: Payer: Self-pay | Admitting: Family Medicine

## 2019-07-08 ENCOUNTER — Ambulatory Visit: Payer: Medicare HMO | Admitting: Physical Therapy

## 2019-07-08 ENCOUNTER — Emergency Department (HOSPITAL_BASED_OUTPATIENT_CLINIC_OR_DEPARTMENT_OTHER)
Admission: EM | Admit: 2019-07-08 | Discharge: 2019-07-08 | Disposition: A | Discharge status: Home / Self Care | Payer: Medicare HMO | Attending: Emergency Medicine | Admitting: Emergency Medicine

## 2019-07-08 DIAGNOSIS — Z20828 Contact with and (suspected) exposure to other viral communicable diseases: Secondary | ICD-10-CM | POA: Insufficient documentation

## 2019-07-08 DIAGNOSIS — Z87891 Personal history of nicotine dependence: Secondary | ICD-10-CM | POA: Diagnosis not present

## 2019-07-08 DIAGNOSIS — Z20822 Contact with and (suspected) exposure to covid-19: Secondary | ICD-10-CM

## 2019-07-08 DIAGNOSIS — R05 Cough: Secondary | ICD-10-CM | POA: Diagnosis not present

## 2019-07-08 DIAGNOSIS — Z5320 Procedure and treatment not carried out because of patient's decision for unspecified reasons: Secondary | ICD-10-CM | POA: Diagnosis not present

## 2019-07-08 DIAGNOSIS — Z79899 Other long term (current) drug therapy: Secondary | ICD-10-CM | POA: Insufficient documentation

## 2019-07-08 DIAGNOSIS — J069 Acute upper respiratory infection, unspecified: Secondary | ICD-10-CM

## 2019-07-08 DIAGNOSIS — J45909 Unspecified asthma, uncomplicated: Secondary | ICD-10-CM | POA: Diagnosis not present

## 2019-07-08 DIAGNOSIS — I10 Essential (primary) hypertension: Secondary | ICD-10-CM | POA: Insufficient documentation

## 2019-07-08 DIAGNOSIS — R519 Headache, unspecified: Secondary | ICD-10-CM | POA: Diagnosis not present

## 2019-07-08 DIAGNOSIS — R067 Sneezing: Secondary | ICD-10-CM | POA: Diagnosis not present

## 2019-07-08 LAB — SARS CORONAVIRUS 2 AG (30 MIN TAT): SARS Coronavirus 2 Ag: NEGATIVE

## 2019-07-08 NOTE — Progress Notes (Signed)
TOC CM chart reviewed for CM needs, 11 ED visits in 6 months, pt noted to have insurance and PCP. Recent PCP visit on 07/04/2019. [place], [place] ED TOC CM [phone]

## 2019-07-08 NOTE — ED Triage Notes (Signed)
Pt c/o cough, weakness, HA, blood in nose-abx since 11/19 for URI-NAD-steady gait
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
## 2019-07-08 NOTE — Discharge Instructions (Addendum)
You have been diagnosed today with URI, suspected COVID-19 virus infection.  At this time there does not appear to be the presence of an emergent medical condition, however there is always the potential for conditions to change. Please read and follow the below instructions.  Please return to the Emergency Department immediately for any new or worsening symptoms. Please be sure to follow up with your Primary Care Provider within one week regarding your visit today; please call their office to schedule an appointment even if you are feeling better for a follow-up visit. Please finish your antibiotic prescribed to for treatment of any potentially resolving infection.  Please drink plenty of water and get plenty of rest.  You may use over-the-counter anti-inflammatories such as Tylenol as directed on the packaging to help with your symptoms. Please call your ENT to schedule a follow-up visit regarding your sinus pressure, you may also call Dr. Erik Obey on your discharge paperwork to establish ENT care.  Get help right away if: You have trouble breathing. You have pain or pressure in your chest. You have confusion. You have bluish lips and fingernails. You have difficulty waking from sleep. You have trouble seeing. You suddenly have very bad pain in your face or head. You start to have quick, sudden movements or shaking that you cannot control (seizure). You are confused. You have a stiff neck. You have any new/concerning or worsening of symptoms.   Please read the additional information packets attached to your discharge summary.  Do not take your medicine if  develop an itchy rash, swelling in your mouth or lips, or difficulty breathing; call 911 and seek immediate emergency medical attention if this occurs.  Note: Portions of this text may have been transcribed using voice recognition software. Every effort was made to ensure accuracy; however, inadvertent computerized transcription errors  may still be present.

## 2019-07-11 LAB — NOVEL CORONAVIRUS, NAA (HOSP ORDER, SEND-OUT TO REF LAB; TAT 18-24 HRS): SARS-CoV-2, NAA: NOT DETECTED

## 2019-07-14 DIAGNOSIS — M542 Cervicalgia: Secondary | ICD-10-CM | POA: Diagnosis not present

## 2019-07-14 DIAGNOSIS — Z87891 Personal history of nicotine dependence: Secondary | ICD-10-CM | POA: Diagnosis not present

## 2019-07-14 DIAGNOSIS — I10 Essential (primary) hypertension: Secondary | ICD-10-CM | POA: Diagnosis not present

## 2019-07-14 IMAGING — DX DG CHEST 2V
2 series · 2 of 2 positions shown · non-contrast
Comparison: 04/12/2018 chest radiograph.

CLINICAL DATA: Chest pain

EXAM:
CHEST - 2 VIEW

[chest pa]
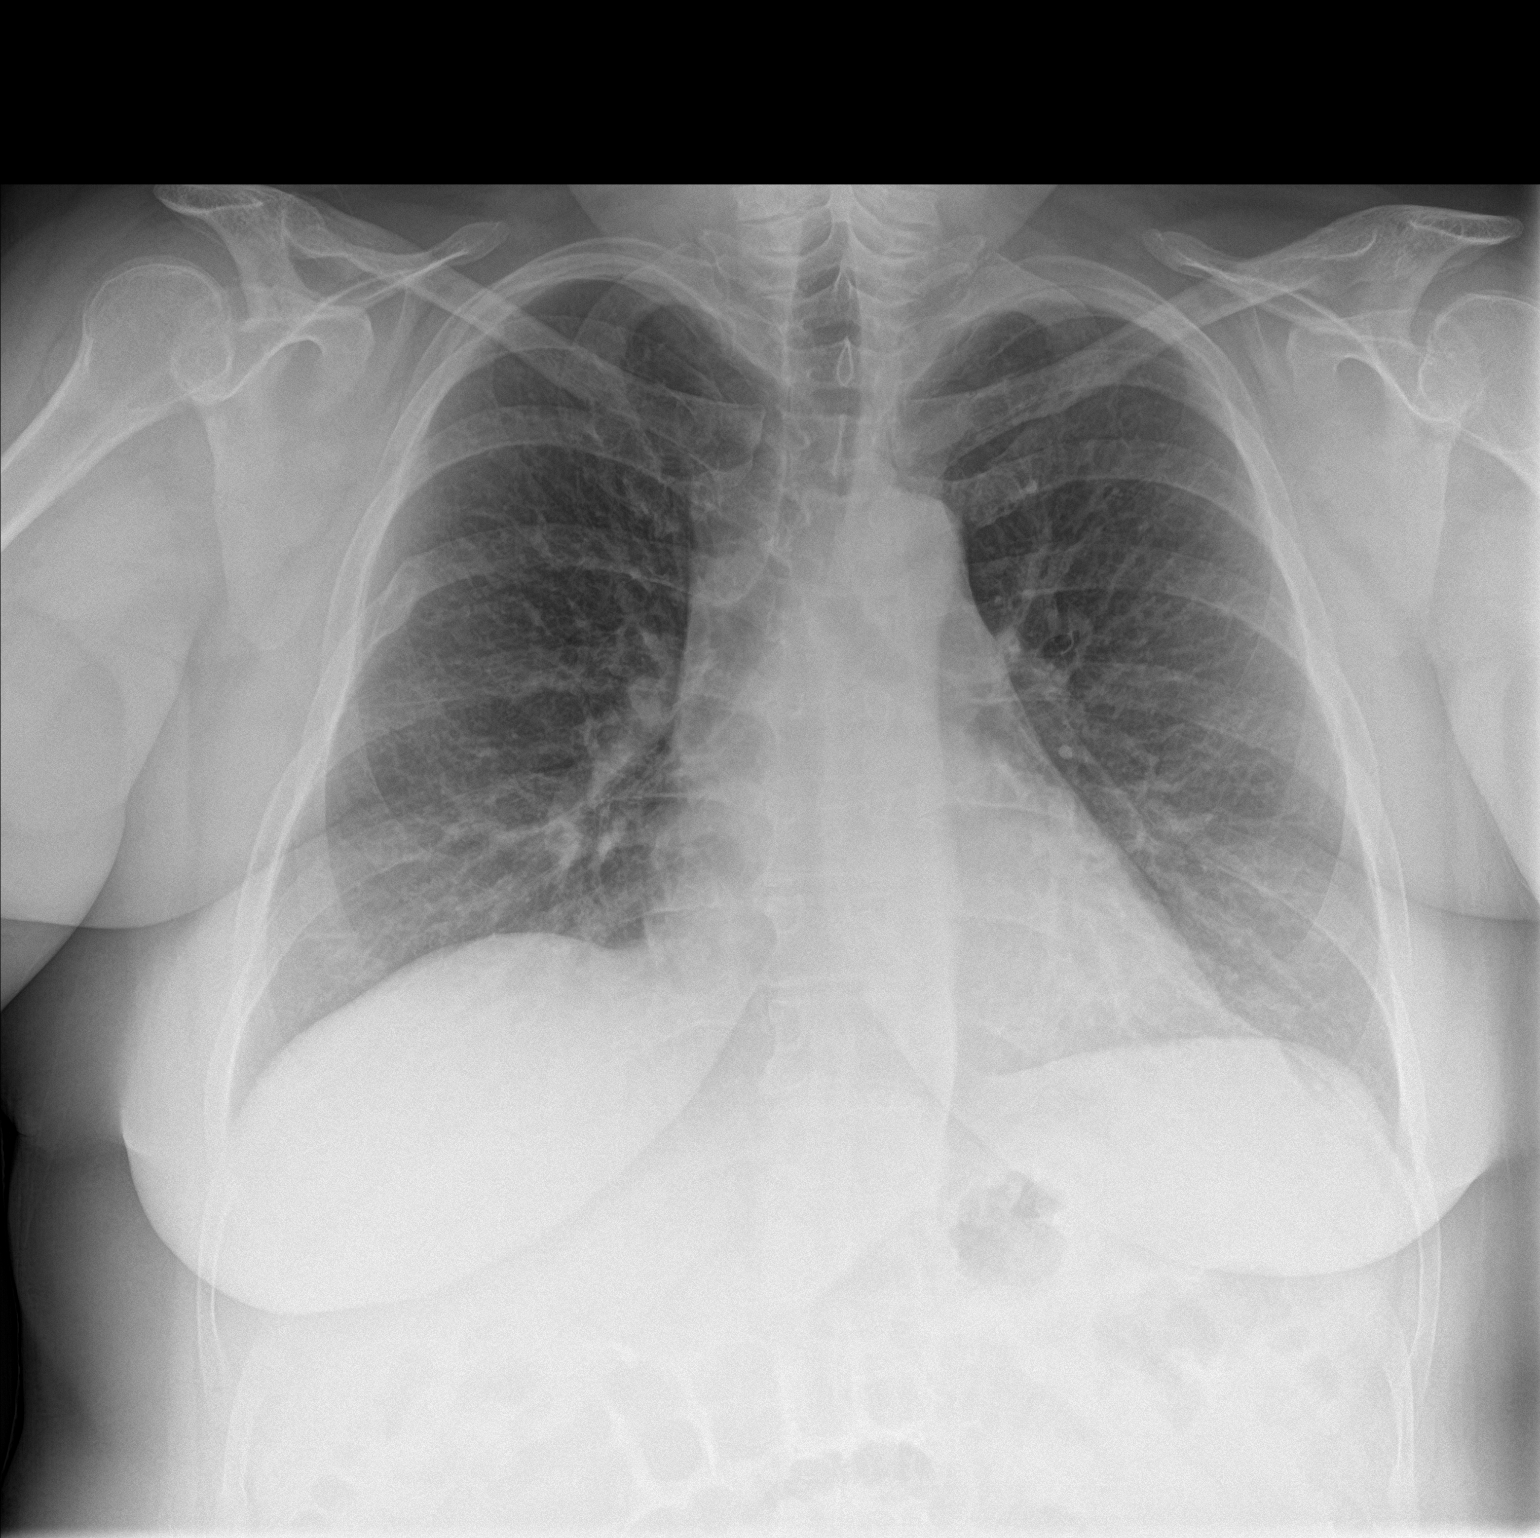

[chest lat]
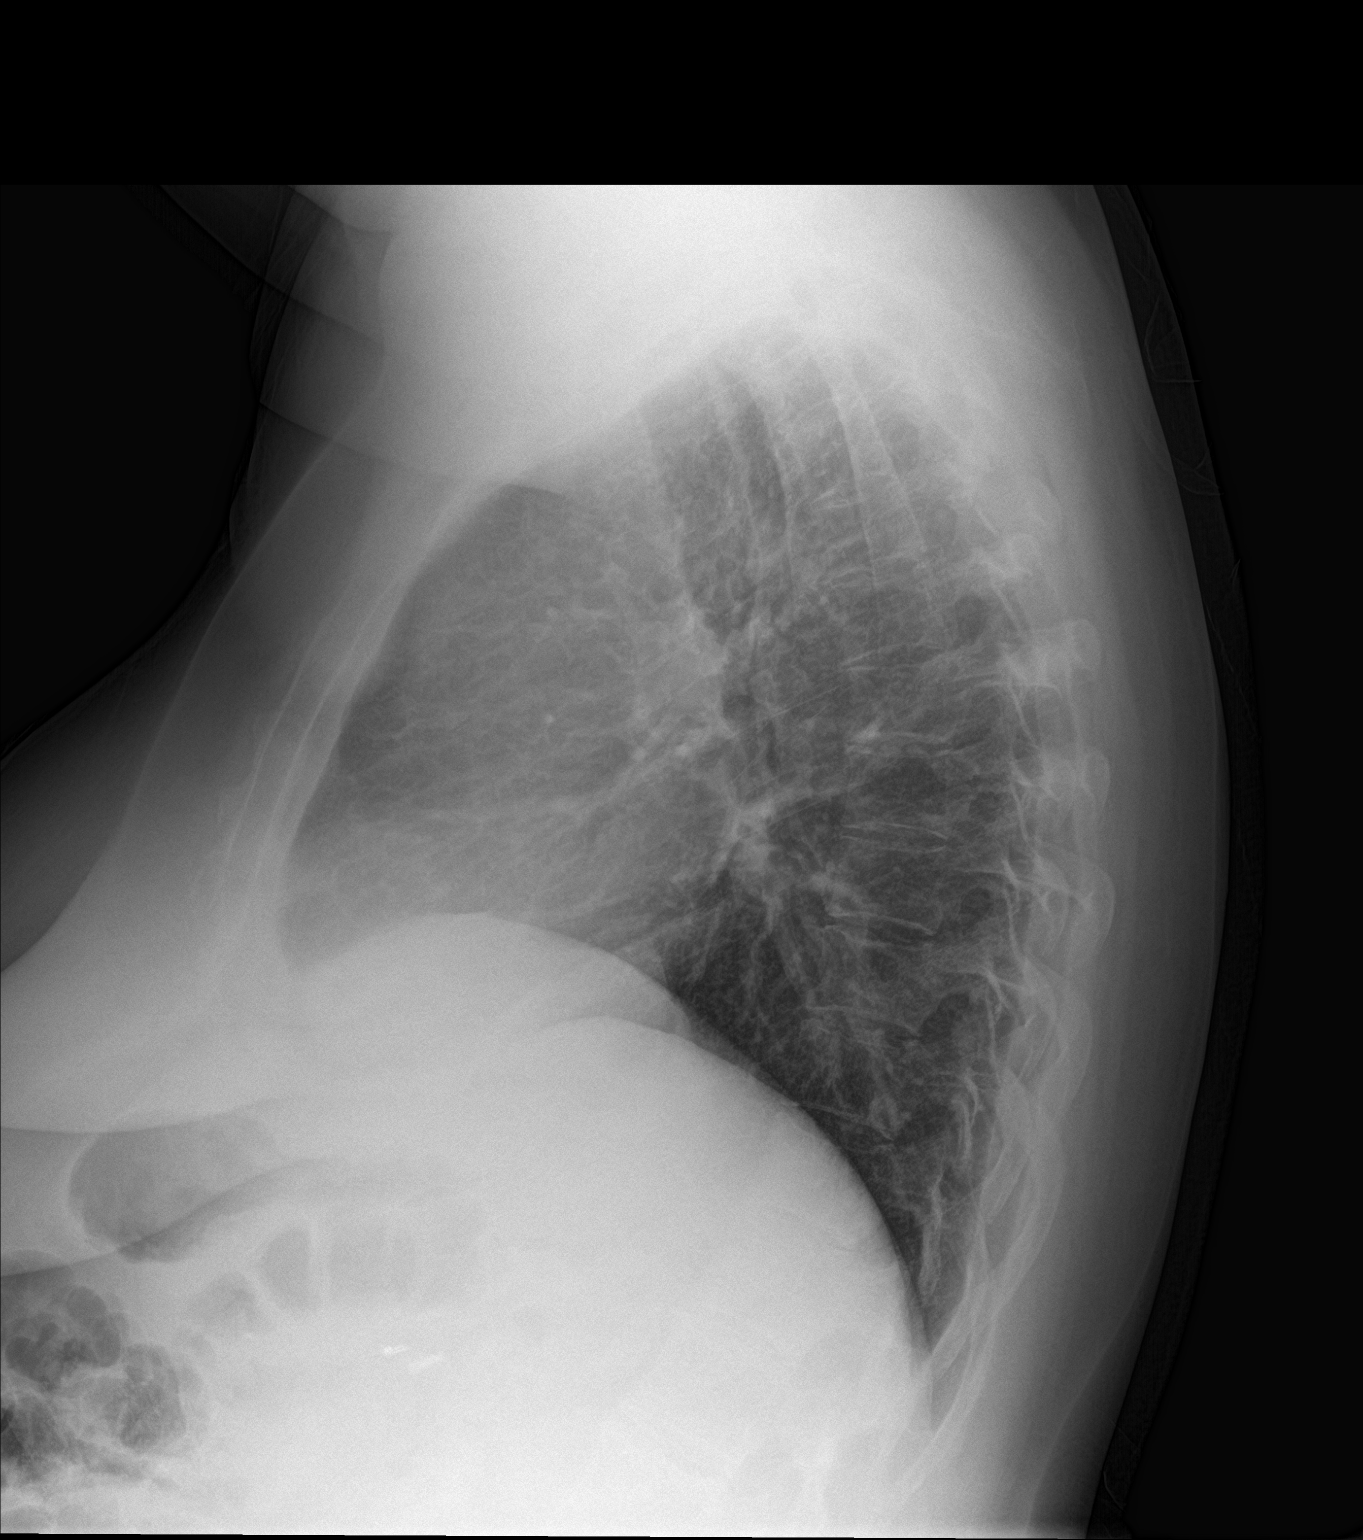

[2 of 2 positions shown; findings below may reference images not displayed]

FINDINGS: Stable cardiomediastinal silhouette with normal heart size. No
pneumothorax. No pleural effusion. Lungs appear clear, with no acute
consolidative airspace disease and no pulmonary edema.
Cholecystectomy clips are seen in the right upper quadrant of the
abdomen.
IMPRESSION: No active cardiopulmonary disease.

## 2019-07-16 ENCOUNTER — Telehealth: Payer: Self-pay

## 2019-07-16 DIAGNOSIS — I10 Essential (primary) hypertension: Secondary | ICD-10-CM | POA: Diagnosis not present

## 2019-07-16 DIAGNOSIS — J45909 Unspecified asthma, uncomplicated: Secondary | ICD-10-CM | POA: Diagnosis not present

## 2019-07-16 DIAGNOSIS — R079 Chest pain, unspecified: Secondary | ICD-10-CM | POA: Diagnosis not present

## 2019-07-16 DIAGNOSIS — Z881 Allergy status to other antibiotic agents status: Secondary | ICD-10-CM | POA: Diagnosis not present

## 2019-07-16 DIAGNOSIS — Z882 Allergy status to sulfonamides status: Secondary | ICD-10-CM | POA: Diagnosis not present

## 2019-07-16 DIAGNOSIS — R072 Precordial pain: Secondary | ICD-10-CM | POA: Diagnosis not present

## 2019-07-16 DIAGNOSIS — K219 Gastro-esophageal reflux disease without esophagitis: Secondary | ICD-10-CM | POA: Diagnosis not present

## 2019-07-16 DIAGNOSIS — E1165 Type 2 diabetes mellitus with hyperglycemia: Secondary | ICD-10-CM | POA: Diagnosis not present

## 2019-07-16 DIAGNOSIS — Z888 Allergy status to other drugs, medicaments and biological substances status: Secondary | ICD-10-CM | POA: Diagnosis not present

## 2019-07-16 DIAGNOSIS — R9431 Abnormal electrocardiogram [ECG] [EKG]: Secondary | ICD-10-CM | POA: Diagnosis not present

## 2019-07-16 DIAGNOSIS — Z87891 Personal history of nicotine dependence: Secondary | ICD-10-CM | POA: Diagnosis not present

## 2019-07-16 DIAGNOSIS — R519 Headache, unspecified: Secondary | ICD-10-CM | POA: Diagnosis not present

## 2019-07-16 DIAGNOSIS — E785 Hyperlipidemia, unspecified: Secondary | ICD-10-CM | POA: Diagnosis not present

## 2019-07-16 NOTE — Telephone Encounter (Signed)
Patient called wanting to talk to [facility] concerning her recent ER visits, her blood sugar, and her blood pressures. She wanted to see Dr Madilyn Fireman but she is not in office for remainder of the week. No urgent needs, just request that [facility] call her.   Thanks!

## 2019-07-16 NOTE — Telephone Encounter (Signed)
Patient is asking for lab work for A1C at [facility] in [place] and see if they can take her blood pressure.

## 2019-07-17 ENCOUNTER — Encounter: Payer: Self-pay | Admitting: Family Medicine

## 2019-07-17 DIAGNOSIS — Z87891 Personal history of nicotine dependence: Secondary | ICD-10-CM | POA: Diagnosis not present

## 2019-07-17 DIAGNOSIS — R519 Headache, unspecified: Secondary | ICD-10-CM | POA: Diagnosis not present

## 2019-07-17 DIAGNOSIS — R079 Chest pain, unspecified: Secondary | ICD-10-CM | POA: Diagnosis not present

## 2019-07-17 DIAGNOSIS — I10 Essential (primary) hypertension: Secondary | ICD-10-CM | POA: Diagnosis not present

## 2019-07-17 NOTE — Telephone Encounter (Signed)
Pt called and was asking

## 2019-07-18 ENCOUNTER — Other Ambulatory Visit: Payer: Self-pay | Admitting: Family Medicine

## 2019-07-18 DIAGNOSIS — E876 Hypokalemia: Secondary | ICD-10-CM | POA: Diagnosis not present

## 2019-07-18 LAB — BASIC METABOLIC PANEL
BUN: 18 (ref 4–21)
CO2: 25 — AB (ref 13–22)
Chloride: 108 (ref 99–108)
Glucose: 108
Potassium: 3.7 (ref 3.4–5.3)
Sodium: 140 (ref 137–147)

## 2019-07-18 LAB — COMPREHENSIVE METABOLIC PANEL
Calcium: 9 (ref 8.7–10.7)
GFR calc non Af Amer: >=90

## 2019-07-18 MED ORDER — CLONIDINE HCL 0.1 MG PO TABS: 0.1000 mg | ORAL_TABLET | Freq: Two times a day (BID) | ORAL | 0 refills | Status: DC

## 2019-07-18 NOTE — Progress Notes (Signed)
Clonidine sent.

## 2019-07-19 ENCOUNTER — Ambulatory Visit (HOSPITAL_BASED_OUTPATIENT_CLINIC_OR_DEPARTMENT_OTHER)
Admission: RE | Admit: 2019-07-19 | Discharge: 2019-07-19 | Disposition: A | Discharge status: Home / Self Care | Payer: Medicare HMO | Source: Ambulatory Visit | Attending: Family Medicine | Admitting: Family Medicine

## 2019-07-19 ENCOUNTER — Other Ambulatory Visit: Payer: Self-pay

## 2019-07-19 DIAGNOSIS — M542 Cervicalgia: Secondary | ICD-10-CM | POA: Insufficient documentation

## 2019-07-21 ENCOUNTER — Telehealth: Payer: Self-pay

## 2019-07-21 ENCOUNTER — Encounter (HOSPITAL_BASED_OUTPATIENT_CLINIC_OR_DEPARTMENT_OTHER): Payer: Self-pay | Admitting: *Deleted

## 2019-07-21 ENCOUNTER — Other Ambulatory Visit: Payer: Self-pay

## 2019-07-21 ENCOUNTER — Emergency Department (HOSPITAL_BASED_OUTPATIENT_CLINIC_OR_DEPARTMENT_OTHER): Payer: Medicare HMO

## 2019-07-21 ENCOUNTER — Emergency Department (HOSPITAL_BASED_OUTPATIENT_CLINIC_OR_DEPARTMENT_OTHER)
Admission: EM | Admit: 2019-07-21 | Discharge: 2019-07-21 | Disposition: A | Discharge status: Home / Self Care | Payer: Medicare HMO | Attending: Emergency Medicine | Admitting: Emergency Medicine

## 2019-07-21 DIAGNOSIS — S76212A Strain of adductor muscle, fascia and tendon of left thigh, initial encounter: Secondary | ICD-10-CM | POA: Insufficient documentation

## 2019-07-21 DIAGNOSIS — Y998 Other external cause status: Secondary | ICD-10-CM | POA: Diagnosis not present

## 2019-07-21 DIAGNOSIS — X500XXA Overexertion from strenuous movement or load, initial encounter: Secondary | ICD-10-CM | POA: Insufficient documentation

## 2019-07-21 DIAGNOSIS — J45909 Unspecified asthma, uncomplicated: Secondary | ICD-10-CM | POA: Insufficient documentation

## 2019-07-21 DIAGNOSIS — Y9389 Activity, other specified: Secondary | ICD-10-CM | POA: Insufficient documentation

## 2019-07-21 DIAGNOSIS — R1013 Epigastric pain: Secondary | ICD-10-CM | POA: Diagnosis not present

## 2019-07-21 DIAGNOSIS — R079 Chest pain, unspecified: Secondary | ICD-10-CM | POA: Diagnosis not present

## 2019-07-21 DIAGNOSIS — Z79899 Other long term (current) drug therapy: Secondary | ICD-10-CM | POA: Insufficient documentation

## 2019-07-21 DIAGNOSIS — Z87891 Personal history of nicotine dependence: Secondary | ICD-10-CM | POA: Diagnosis not present

## 2019-07-21 DIAGNOSIS — Y9289 Other specified places as the place of occurrence of the external cause: Secondary | ICD-10-CM | POA: Insufficient documentation

## 2019-07-21 DIAGNOSIS — M545 Low back pain: Secondary | ICD-10-CM | POA: Diagnosis present

## 2019-07-21 DIAGNOSIS — E876 Hypokalemia: Secondary | ICD-10-CM | POA: Diagnosis not present

## 2019-07-21 DIAGNOSIS — I10 Essential (primary) hypertension: Secondary | ICD-10-CM | POA: Diagnosis not present

## 2019-07-21 DIAGNOSIS — T148XXA Other injury of unspecified body region, initial encounter: Secondary | ICD-10-CM

## 2019-07-21 DIAGNOSIS — R69 Illness, unspecified: Secondary | ICD-10-CM | POA: Diagnosis not present

## 2019-07-21 DIAGNOSIS — G35 Multiple sclerosis: Secondary | ICD-10-CM | POA: Diagnosis not present

## 2019-07-21 DIAGNOSIS — M1612 Unilateral primary osteoarthritis, left hip: Secondary | ICD-10-CM | POA: Diagnosis not present

## 2019-07-21 DIAGNOSIS — G8929 Other chronic pain: Secondary | ICD-10-CM | POA: Diagnosis not present

## 2019-07-21 DIAGNOSIS — R0789 Other chest pain: Secondary | ICD-10-CM | POA: Diagnosis not present

## 2019-07-21 LAB — URINALYSIS, ROUTINE W REFLEX MICROSCOPIC
Bilirubin Urine: NEGATIVE
Glucose, UA: NEGATIVE mg/dL
Hgb urine dipstick: NEGATIVE
Ketones, ur: NEGATIVE mg/dL
Leukocytes,Ua: NEGATIVE
Nitrite: NEGATIVE
Protein, ur: NEGATIVE mg/dL
Specific Gravity, Urine: >1.03 — ABNORMAL HIGH (ref 1.005–1.030)
pH: 6 (ref 5.0–8.0)

## 2019-07-21 MED ORDER — METHOCARBAMOL 500 MG PO TABS: 500.0000 mg | ORAL_TABLET | Freq: Two times a day (BID) | ORAL | 0 refills | Status: DC

## 2019-07-21 NOTE — ED Triage Notes (Signed)
Left groin pain from the fall last September.
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
## 2019-07-21 NOTE — Telephone Encounter (Signed)
Looks like she ended up going to the ED today so yes okay to wait until Friday at this point.

## 2019-07-21 NOTE — Discharge Instructions (Signed)
Take the medications as directed. Return to the ED if you start to experience worsening symptoms, additional injuries or falls, chest pain or shortness of breath.

## 2019-07-21 NOTE — Telephone Encounter (Signed)
Jennifer [name] called and states she had left chest pain through to her shoulder blade for about 10 minutes today. She had a little SOB on the left side. Denies nausea, dizziness or vomiting. The pain has resolved. She wanted to know if this is something that can wait until her appointment on Friday.

## 2019-07-22 ENCOUNTER — Encounter: Payer: Self-pay | Admitting: Family Medicine

## 2019-07-22 ENCOUNTER — Ambulatory Visit: Payer: Medicare HMO | Admitting: Family Medicine

## 2019-07-22 DIAGNOSIS — E785 Hyperlipidemia, unspecified: Secondary | ICD-10-CM | POA: Diagnosis not present

## 2019-07-22 DIAGNOSIS — J45909 Unspecified asthma, uncomplicated: Secondary | ICD-10-CM | POA: Diagnosis not present

## 2019-07-22 DIAGNOSIS — E876 Hypokalemia: Secondary | ICD-10-CM | POA: Diagnosis not present

## 2019-07-22 DIAGNOSIS — Z880 Allergy status to penicillin: Secondary | ICD-10-CM | POA: Diagnosis not present

## 2019-07-22 DIAGNOSIS — E119 Type 2 diabetes mellitus without complications: Secondary | ICD-10-CM | POA: Diagnosis not present

## 2019-07-22 DIAGNOSIS — Z91010 Allergy to peanuts: Secondary | ICD-10-CM | POA: Diagnosis not present

## 2019-07-22 DIAGNOSIS — K219 Gastro-esophageal reflux disease without esophagitis: Secondary | ICD-10-CM | POA: Diagnosis not present

## 2019-07-22 DIAGNOSIS — Z881 Allergy status to other antibiotic agents status: Secondary | ICD-10-CM | POA: Diagnosis not present

## 2019-07-22 DIAGNOSIS — I10 Essential (primary) hypertension: Secondary | ICD-10-CM | POA: Diagnosis not present

## 2019-07-22 DIAGNOSIS — Z87891 Personal history of nicotine dependence: Secondary | ICD-10-CM | POA: Diagnosis not present

## 2019-07-22 LAB — URINE CULTURE: Culture: <10000 — AB

## 2019-07-22 NOTE — Telephone Encounter (Signed)
Spoke to pt and she is aware of Dr. Gardiner Ramus recommendation.  KES,CMA

## 2019-07-24 ENCOUNTER — Encounter (HOSPITAL_BASED_OUTPATIENT_CLINIC_OR_DEPARTMENT_OTHER): Payer: Self-pay | Admitting: *Deleted

## 2019-07-24 ENCOUNTER — Emergency Department (HOSPITAL_BASED_OUTPATIENT_CLINIC_OR_DEPARTMENT_OTHER)
Admission: EM | Admit: 2019-07-24 | Discharge: 2019-07-24 | Disposition: A | Discharge status: Home / Self Care | Payer: Medicare HMO | Attending: Emergency Medicine | Admitting: Emergency Medicine

## 2019-07-24 ENCOUNTER — Encounter (INDEPENDENT_AMBULATORY_CARE_PROVIDER_SITE_OTHER): Payer: Self-pay

## 2019-07-24 ENCOUNTER — Other Ambulatory Visit: Payer: Self-pay

## 2019-07-24 DIAGNOSIS — J45909 Unspecified asthma, uncomplicated: Secondary | ICD-10-CM | POA: Diagnosis not present

## 2019-07-24 DIAGNOSIS — Z882 Allergy status to sulfonamides status: Secondary | ICD-10-CM | POA: Insufficient documentation

## 2019-07-24 DIAGNOSIS — E785 Hyperlipidemia, unspecified: Secondary | ICD-10-CM | POA: Diagnosis not present

## 2019-07-24 DIAGNOSIS — Z880 Allergy status to penicillin: Secondary | ICD-10-CM | POA: Insufficient documentation

## 2019-07-24 DIAGNOSIS — Z87891 Personal history of nicotine dependence: Secondary | ICD-10-CM | POA: Insufficient documentation

## 2019-07-24 DIAGNOSIS — K5900 Constipation, unspecified: Secondary | ICD-10-CM | POA: Insufficient documentation

## 2019-07-24 DIAGNOSIS — Z79899 Other long term (current) drug therapy: Secondary | ICD-10-CM | POA: Diagnosis not present

## 2019-07-24 DIAGNOSIS — G35 Multiple sclerosis: Secondary | ICD-10-CM | POA: Insufficient documentation

## 2019-07-24 DIAGNOSIS — Z881 Allergy status to other antibiotic agents status: Secondary | ICD-10-CM | POA: Insufficient documentation

## 2019-07-24 DIAGNOSIS — I10 Essential (primary) hypertension: Secondary | ICD-10-CM | POA: Diagnosis not present

## 2019-07-24 DIAGNOSIS — M549 Dorsalgia, unspecified: Secondary | ICD-10-CM | POA: Diagnosis not present

## 2019-07-24 DIAGNOSIS — R1031 Right lower quadrant pain: Secondary | ICD-10-CM | POA: Diagnosis not present

## 2019-07-24 DIAGNOSIS — Z9049 Acquired absence of other specified parts of digestive tract: Secondary | ICD-10-CM | POA: Diagnosis not present

## 2019-07-24 DIAGNOSIS — M797 Fibromyalgia: Secondary | ICD-10-CM | POA: Insufficient documentation

## 2019-07-24 DIAGNOSIS — K5909 Other constipation: Secondary | ICD-10-CM | POA: Diagnosis not present

## 2019-07-24 DIAGNOSIS — R1013 Epigastric pain: Secondary | ICD-10-CM

## 2019-07-24 LAB — CBC WITH DIFFERENTIAL/PLATELET
Abs Immature Granulocytes: 0.02 10*3/uL (ref 0.00–0.07)
Basophils Absolute: 0.1 10*3/uL (ref 0.0–0.1)
Basophils Relative: 1 %
Eosinophils Absolute: 0.2 10*3/uL (ref 0.0–0.5)
Eosinophils Relative: 2 %
HCT: 43.8 % (ref 36.0–46.0)
Hemoglobin: 14.1 g/dL (ref 12.0–15.0)
Immature Granulocytes: 0 %
Lymphocytes Relative: 23 %
Lymphs Abs: 1.8 10*3/uL (ref 0.7–4.0)
MCH: 28.8 pg (ref 26.0–34.0)
MCHC: 32.2 g/dL (ref 30.0–36.0)
MCV: 89.6 fL (ref 80.0–100.0)
Monocytes Absolute: 0.6 10*3/uL (ref 0.1–1.0)
Monocytes Relative: 7 %
Neutro Abs: 5.2 10*3/uL (ref 1.7–7.7)
Neutrophils Relative %: 67 %
Platelets: 212 10*3/uL (ref 150–400)
RBC: 4.89 MIL/uL (ref 3.87–5.11)
RDW: 13.4 % (ref 11.5–15.5)
WBC: 7.7 10*3/uL (ref 4.0–10.5)
nRBC: 0 % (ref 0.0–0.2)

## 2019-07-24 LAB — COMPREHENSIVE METABOLIC PANEL
ALT: 21 U/L (ref 0–44)
AST: 18 U/L (ref 15–41)
Albumin: 4 g/dL (ref 3.5–5.0)
Alkaline Phosphatase: 69 U/L (ref 38–126)
Anion gap: 10 (ref 5–15)
BUN: 12 mg/dL (ref 6–20)
CO2: 24 mmol/L (ref 22–32)
Calcium: 8.8 mg/dL — ABNORMAL LOW (ref 8.9–10.3)
Chloride: 106 mmol/L (ref 98–111)
Creatinine, Ser: 0.53 mg/dL (ref 0.44–1.00)
GFR calc Af Amer: >60 mL/min (ref >60)
GFR calc non Af Amer: >60 mL/min (ref >60)
Glucose, Bld: 91 mg/dL (ref 70–99)
Potassium: 3.8 mmol/L (ref 3.5–5.1)
Sodium: 140 mmol/L (ref 135–145)
Total Bilirubin: 0.7 mg/dL (ref 0.3–1.2)
Total Protein: 7.1 g/dL (ref 6.5–8.1)

## 2019-07-24 LAB — LIPASE, BLOOD: Lipase: 35 U/L (ref 11–51)

## 2019-07-24 MED ORDER — MINERAL OIL RE ENEM
1.0000 | ENEMA | Freq: Once | RECTAL | Status: DC
  Filled 2019-07-24 (×2): qty 1

## 2019-07-24 MED ORDER — ALUM & MAG HYDROXIDE-SIMETH 200-200-20 MG/5ML PO SUSP
30.0000 mL | Freq: Once | ORAL | Status: DC
  Filled 2019-07-24: qty 30
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
## 2019-07-24 NOTE — Discharge Instructions (Addendum)
As discussed, all of your labs today look good. I have given you the number of a GI doctor for further evaluation of your abdominal pain. Make sure you go to your PCP appointment tomorrow for further evaluation. You may use the enema when you get home tonight. Return to the ER for new or worsening symptoms.

## 2019-07-24 NOTE — ED Notes (Signed)
Pt refuses enema and maalox, Provider made aware, will speak to pt.

## 2019-07-24 NOTE — ED Triage Notes (Signed)
Abdominal and back pain. Constipation. No relief with mag citrate and fleets enema.

## 2019-07-25 ENCOUNTER — Other Ambulatory Visit: Payer: Self-pay | Admitting: Family Medicine

## 2019-07-25 ENCOUNTER — Encounter: Payer: Self-pay | Admitting: Family Medicine

## 2019-07-25 ENCOUNTER — Ambulatory Visit (INDEPENDENT_AMBULATORY_CARE_PROVIDER_SITE_OTHER): Payer: Medicare HMO | Admitting: Family Medicine

## 2019-07-25 ENCOUNTER — Telehealth: Payer: Self-pay | Admitting: Family Medicine

## 2019-07-25 ENCOUNTER — Ambulatory Visit: Payer: Medicare HMO | Admitting: Family Medicine

## 2019-07-25 VITALS — BP 162/104 | HR 98 | Ht 62.0 in | Wt 219.0 lb

## 2019-07-25 DIAGNOSIS — K219 Gastro-esophageal reflux disease without esophagitis: Secondary | ICD-10-CM | POA: Diagnosis not present

## 2019-07-25 DIAGNOSIS — R1031 Right lower quadrant pain: Secondary | ICD-10-CM | POA: Diagnosis not present

## 2019-07-25 DIAGNOSIS — I10 Essential (primary) hypertension: Secondary | ICD-10-CM

## 2019-07-25 DIAGNOSIS — K5909 Other constipation: Secondary | ICD-10-CM | POA: Diagnosis not present

## 2019-07-25 DIAGNOSIS — Z9049 Acquired absence of other specified parts of digestive tract: Secondary | ICD-10-CM | POA: Diagnosis not present

## 2019-07-25 DIAGNOSIS — K760 Fatty (change of) liver, not elsewhere classified: Secondary | ICD-10-CM | POA: Diagnosis not present

## 2019-07-25 DIAGNOSIS — F418 Other specified anxiety disorders: Secondary | ICD-10-CM | POA: Diagnosis not present

## 2019-07-25 DIAGNOSIS — K5900 Constipation, unspecified: Secondary | ICD-10-CM

## 2019-07-25 DIAGNOSIS — R7301 Impaired fasting glucose: Secondary | ICD-10-CM | POA: Diagnosis not present

## 2019-07-25 DIAGNOSIS — E119 Type 2 diabetes mellitus without complications: Secondary | ICD-10-CM | POA: Diagnosis not present

## 2019-07-25 DIAGNOSIS — J45909 Unspecified asthma, uncomplicated: Secondary | ICD-10-CM | POA: Diagnosis not present

## 2019-07-25 DIAGNOSIS — D509 Iron deficiency anemia, unspecified: Secondary | ICD-10-CM | POA: Diagnosis not present

## 2019-07-25 DIAGNOSIS — Z87892 Personal history of anaphylaxis: Secondary | ICD-10-CM | POA: Diagnosis not present

## 2019-07-25 DIAGNOSIS — R69 Illness, unspecified: Secondary | ICD-10-CM | POA: Diagnosis not present

## 2019-07-25 DIAGNOSIS — E785 Hyperlipidemia, unspecified: Secondary | ICD-10-CM | POA: Diagnosis not present

## 2019-07-25 LAB — POCT GLYCOSYLATED HEMOGLOBIN (HGB A1C): Hemoglobin A1C: 6.3 % — AB (ref 4.0–5.6)

## 2019-07-25 MED ORDER — AMLODIPINE BESYLATE 2.5 MG PO TABS: 2.5000 mg | ORAL_TABLET | Freq: Every day | ORAL | 0 refills | Status: DC

## 2019-07-25 NOTE — Assessment & Plan Note (Signed)
We try to refer her back to behavioral health.  She would benefit from video visits.

## 2019-07-25 NOTE — Assessment & Plan Note (Signed)
Encouraged her to use her mag citrate up to twice a day until she has a softer runny bowel movement and okay to restart her lactulose she just needs to get back into her regular routine with it she can use it midday so that weight separated from her blood pressure medications which she is concerned about.

## 2019-07-25 NOTE — Patient Instructions (Addendum)
Take your: Metoprolol 25 mg in AM and bedtime Take 1/2 tab spironolactone in the AM at same time as the metoprolol. Take amlodipine 2.5mg  at bedtime with the metoprolol.   Take your lactulose daily around lunch time.   Take your potassium at bedtime with the meds.

## 2019-07-25 NOTE — Assessment & Plan Note (Signed)
Well controlled. Continue current regimen. Follow up in  6 mo  

## 2019-07-25 NOTE — Telephone Encounter (Signed)
I called the patient and she is aware it has been sent to [facility]. No other questions.

## 2019-07-25 NOTE — Telephone Encounter (Signed)
Pt called. Pharmacy did not get script for Metoprolol 25mg   Thanks.
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
## 2019-07-25 NOTE — Assessment & Plan Note (Signed)
Discussed that we need to have consistency with her blood pressure medications.  I want her to take metoprolol 25 mg twice a day.  She agreed to take a half of a tab of spironolactone daily in the morning since she feels like a full tab is too drying.  And she did agree to to retry a low-dose of amlodipine.  Number to have her take that in the evening with her second dose of metoprolol.  She can restart her lactulose daily and take that midday.  I would like for her to keep a blood pressure log and keep a calendar of her appointments etc.  We discussed strategies around doing this.  With plans to follow-up in the next couple of weeks.

## 2019-07-30 ENCOUNTER — Encounter (HOSPITAL_BASED_OUTPATIENT_CLINIC_OR_DEPARTMENT_OTHER): Payer: Self-pay

## 2019-07-30 ENCOUNTER — Other Ambulatory Visit: Payer: Self-pay

## 2019-07-30 ENCOUNTER — Emergency Department (HOSPITAL_BASED_OUTPATIENT_CLINIC_OR_DEPARTMENT_OTHER)
Admission: EM | Admit: 2019-07-30 | Discharge: 2019-07-30 | Disposition: A | Discharge status: Home / Self Care | Payer: Medicare HMO | Attending: Emergency Medicine | Admitting: Emergency Medicine

## 2019-07-30 DIAGNOSIS — J45909 Unspecified asthma, uncomplicated: Secondary | ICD-10-CM | POA: Insufficient documentation

## 2019-07-30 DIAGNOSIS — I10 Essential (primary) hypertension: Secondary | ICD-10-CM | POA: Insufficient documentation

## 2019-07-30 DIAGNOSIS — B9789 Other viral agents as the cause of diseases classified elsewhere: Secondary | ICD-10-CM | POA: Diagnosis not present

## 2019-07-30 DIAGNOSIS — Z20828 Contact with and (suspected) exposure to other viral communicable diseases: Secondary | ICD-10-CM | POA: Diagnosis not present

## 2019-07-30 DIAGNOSIS — Z87891 Personal history of nicotine dependence: Secondary | ICD-10-CM | POA: Insufficient documentation

## 2019-07-30 DIAGNOSIS — R05 Cough: Secondary | ICD-10-CM | POA: Diagnosis not present

## 2019-07-30 DIAGNOSIS — R0602 Shortness of breath: Secondary | ICD-10-CM | POA: Diagnosis not present

## 2019-07-30 DIAGNOSIS — Z8542 Personal history of malignant neoplasm of other parts of uterus: Secondary | ICD-10-CM | POA: Diagnosis not present

## 2019-07-30 DIAGNOSIS — J069 Acute upper respiratory infection, unspecified: Secondary | ICD-10-CM | POA: Insufficient documentation

## 2019-07-30 DIAGNOSIS — R0981 Nasal congestion: Secondary | ICD-10-CM | POA: Diagnosis not present

## 2019-07-30 MED ORDER — DEXAMETHASONE 4 MG PO TABS
ORAL_TABLET | ORAL | Status: AC
  Filled 2019-07-30: qty 1

## 2019-07-30 MED ORDER — DEXAMETHASONE 4 MG PO TABS
2.0000 mg | ORAL_TABLET | Freq: Once | ORAL | Status: AC
  Administered 2019-07-30: 2 mg via ORAL

## 2019-07-30 NOTE — ED Triage Notes (Signed)
Pt entered triage talking on phone-NAD-c/o cough, SOB, wheezing since 12/13 with no relief with inhalers-NAD-steady gait

## 2019-07-30 NOTE — Discharge Instructions (Signed)
Person Under Monitoring Name: Jennifer [name]  Location: [address] [place] [address]   Infection Prevention Recommendations for Individuals Confirmed to have, or Being Evaluated for, 2019 Novel Coronavirus (COVID-19) Infection Who Receive Care at Home  Individuals who are confirmed to have, or are being evaluated for, COVID-19 should follow the prevention steps below until a healthcare provider or local or state health department says they can return to normal activities.  Stay home except to get medical care You should restrict activities outside your home, except for getting medical care. Do not go to work, school, or public areas, and do not use public transportation or taxis.  Call ahead before visiting your doctor Before your medical appointment, call the healthcare provider and tell them that you have, or are being evaluated for, COVID-19 infection. This will help the healthcare provider's office take steps to keep other people from getting infected. Ask your healthcare provider to call the local or state health department.  Monitor your symptoms Seek prompt medical attention if your illness is worsening (e.g., difficulty breathing). Before going to your medical appointment, call the healthcare provider and tell them that you have, or are being evaluated for, COVID-19 infection. Ask your healthcare provider to call the local or state health department.  Wear a facemask You should wear a facemask that covers your nose and mouth when you are in the same room with other people and when you visit a healthcare provider. People who live with or visit you should also wear a facemask while they are in the same room with you.  Separate yourself from other people in your home As much as possible, you should stay in a different room from other people in your home. Also, you should use a separate bathroom, if available.  Avoid sharing household items You should not share  dishes, drinking glasses, cups, eating utensils, towels, bedding, or other items with other people in your home. After using these items, you should wash them thoroughly with soap and water.  Cover your coughs and sneezes Cover your mouth and nose with a tissue when you cough or sneeze, or you can cough or sneeze into your sleeve. Throw used tissues in a lined trash can, and immediately wash your hands with soap and water for at least 20 seconds or use an alcohol-based hand rub.  Wash your [facility] your hands often and thoroughly with soap and water for at least 20 seconds. You can use an alcohol-based hand sanitizer if soap and water are not available and if your hands are not visibly dirty. Avoid touching your eyes, nose, and mouth with unwashed hands.   Prevention Steps for Caregivers and Household Members of Individuals Confirmed to have, or Being Evaluated for, COVID-19 Infection Being Cared for in the Home  If you live with, or provide care at home for, a person confirmed to have, or being evaluated for, COVID-19 infection please follow these guidelines to prevent infection:  Follow healthcare provider's instructions Make sure that you understand and can help the patient follow any healthcare provider instructions for all care.  Provide for the patient's basic needs You should help the patient with basic needs in the home and provide support for getting groceries, prescriptions, and other personal needs.  Monitor the patient's symptoms If they are getting sicker, call his or her medical provider and tell them that the patient has, or is being evaluated for, COVID-19 infection. This will help the healthcare provider's  office take steps to keep other people from getting infected. Ask the healthcare provider to call the local or state health department.  Limit the number of people who have contact with the patient If possible, have only one caregiver for the patient. Other  household members should stay in another home or place of residence. If this is not possible, they should stay in another room, or be separated from the patient as much as possible. Use a separate bathroom, if available. Restrict visitors who do not have an essential need to be in the home.  Keep older adults, very [place] children, and other sick people away from the patient Keep older adults, very [place] children, and those who have compromised immune systems or chronic health conditions away from the patient. This includes people with chronic heart, lung, or kidney conditions, diabetes, and cancer.  Ensure good ventilation Make sure that shared spaces in the home have good air flow, such as from an air conditioner or an opened window, weather permitting.  Wash your hands often Wash your hands often and thoroughly with soap and water for at least 20 seconds. You can use an alcohol based hand sanitizer if soap and water are not available and if your hands are not visibly dirty. Avoid touching your eyes, nose, and mouth with unwashed hands. Use disposable paper towels to dry your hands. If not available, use dedicated cloth towels and replace them when they become wet.  Wear a facemask and gloves Wear a disposable facemask at all times in the room and gloves when you touch or have contact with the patient's blood, body fluids, and/or secretions or excretions, such as sweat, saliva, sputum, nasal mucus, vomit, urine, or feces.  Ensure the mask fits over your nose and mouth tightly, and do not touch it during use. Throw out disposable facemasks and gloves after using them. Do not reuse. Wash your hands immediately after removing your facemask and gloves. If your personal clothing becomes contaminated, carefully remove clothing and launder. Wash your hands after handling contaminated clothing. Place all used disposable facemasks, gloves, and other waste in a lined container before disposing them with  other household waste. Remove gloves and wash your hands immediately after handling these items.  Do not share dishes, glasses, or other household items with the patient Avoid sharing household items. You should not share dishes, drinking glasses, cups, eating utensils, towels, bedding, or other items with a patient who is confirmed to have, or being evaluated for, COVID-19 infection. After the person uses these items, you should wash them thoroughly with soap and water.  Wash laundry thoroughly Immediately remove and wash clothes or bedding that have blood, body fluids, and/or secretions or excretions, such as sweat, saliva, sputum, nasal mucus, vomit, urine, or feces, on them. Wear gloves when handling laundry from the patient. Read and follow directions on labels of laundry or clothing items and detergent. In general, wash and dry with the warmest temperatures recommended on the label.  Clean all areas the individual has used often Clean all touchable surfaces, such as counters, tabletops, doorknobs, bathroom fixtures, toilets, phones, keyboards, tablets, and bedside tables, every day. Also, clean any surfaces that may have blood, body fluids, and/or secretions or excretions on them. Wear gloves when cleaning surfaces the patient has come in contact with. Use a diluted bleach solution (e.g., dilute bleach with 1 part bleach and 10 parts water) or a household disinfectant with a label that says EPA-registered for coronaviruses. To make a  bleach solution at home, add 1 tablespoon of bleach to 1 quart (4 cups) of water. For a larger supply, add  cup of bleach to 1 gallon (16 cups) of water. Read labels of cleaning products and follow recommendations provided on product labels. Labels contain instructions for safe and effective use of the cleaning product including precautions you should take when applying the product, such as wearing gloves or eye protection and making sure you have good ventilation  during use of the product. Remove gloves and wash hands immediately after cleaning.  Monitor yourself for signs and symptoms of illness Caregivers and household members are considered close contacts, should monitor their health, and will be asked to limit movement outside of the home to the extent possible. Follow the monitoring steps for close contacts listed on the symptom monitoring form.   ? If you have additional questions, contact your local health department or call the epidemiologist on call at [phone] (available 24/7). ? This guidance is subject to change. For the most up-to-date guidance from [facility], please refer to their website: [email or web address]
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
## 2019-07-31 ENCOUNTER — Encounter: Payer: Self-pay | Admitting: Family Medicine

## 2019-07-31 LAB — NOVEL CORONAVIRUS, NAA (HOSP ORDER, SEND-OUT TO REF LAB; TAT 18-24 HRS): SARS-CoV-2, NAA: NOT DETECTED

## 2019-07-31 NOTE — Telephone Encounter (Signed)
Trying to move throught the day is better and safer than any blood thinner.  Can add a baby aspirin if would like.  Ok to schedule with me next week. I am out of the office 08/01/2019

## 2019-08-01 DIAGNOSIS — R197 Diarrhea, unspecified: Secondary | ICD-10-CM | POA: Diagnosis not present

## 2019-08-01 DIAGNOSIS — I10 Essential (primary) hypertension: Secondary | ICD-10-CM | POA: Diagnosis not present

## 2019-08-01 DIAGNOSIS — R070 Pain in throat: Secondary | ICD-10-CM | POA: Diagnosis not present

## 2019-08-01 DIAGNOSIS — R0602 Shortness of breath: Secondary | ICD-10-CM | POA: Diagnosis not present

## 2019-08-01 DIAGNOSIS — J069 Acute upper respiratory infection, unspecified: Secondary | ICD-10-CM | POA: Diagnosis not present

## 2019-08-01 DIAGNOSIS — R062 Wheezing: Secondary | ICD-10-CM | POA: Diagnosis not present

## 2019-08-01 DIAGNOSIS — Z87891 Personal history of nicotine dependence: Secondary | ICD-10-CM | POA: Diagnosis not present

## 2019-08-03 DIAGNOSIS — I10 Essential (primary) hypertension: Secondary | ICD-10-CM | POA: Diagnosis not present

## 2019-08-03 DIAGNOSIS — R0602 Shortness of breath: Secondary | ICD-10-CM | POA: Diagnosis not present

## 2019-08-03 DIAGNOSIS — R05 Cough: Secondary | ICD-10-CM | POA: Diagnosis not present

## 2019-08-03 DIAGNOSIS — R1013 Epigastric pain: Secondary | ICD-10-CM | POA: Diagnosis not present

## 2019-08-03 DIAGNOSIS — R110 Nausea: Secondary | ICD-10-CM | POA: Diagnosis not present

## 2019-08-03 DIAGNOSIS — R079 Chest pain, unspecified: Secondary | ICD-10-CM | POA: Diagnosis not present

## 2019-08-03 DIAGNOSIS — G35 Multiple sclerosis: Secondary | ICD-10-CM | POA: Diagnosis not present

## 2019-08-03 DIAGNOSIS — R0981 Nasal congestion: Secondary | ICD-10-CM | POA: Diagnosis not present

## 2019-08-03 DIAGNOSIS — Z20828 Contact with and (suspected) exposure to other viral communicable diseases: Secondary | ICD-10-CM | POA: Diagnosis not present

## 2019-08-03 DIAGNOSIS — R5383 Other fatigue: Secondary | ICD-10-CM | POA: Diagnosis not present

## 2019-08-03 DIAGNOSIS — K5900 Constipation, unspecified: Secondary | ICD-10-CM | POA: Diagnosis not present

## 2019-08-04 ENCOUNTER — Ambulatory Visit: Payer: Medicare HMO | Admitting: Family Medicine

## 2019-08-04 ENCOUNTER — Telehealth (INDEPENDENT_AMBULATORY_CARE_PROVIDER_SITE_OTHER): Payer: Medicare HMO | Admitting: Family Medicine

## 2019-08-04 ENCOUNTER — Encounter: Payer: Self-pay | Admitting: Family Medicine

## 2019-08-04 VITALS — BP 147/88 | HR 82

## 2019-08-04 DIAGNOSIS — J40 Bronchitis, not specified as acute or chronic: Secondary | ICD-10-CM | POA: Diagnosis not present

## 2019-08-04 DIAGNOSIS — R0789 Other chest pain: Secondary | ICD-10-CM | POA: Diagnosis not present

## 2019-08-04 DIAGNOSIS — J0100 Acute maxillary sinusitis, unspecified: Secondary | ICD-10-CM | POA: Diagnosis not present

## 2019-08-04 DIAGNOSIS — J329 Chronic sinusitis, unspecified: Secondary | ICD-10-CM

## 2019-08-04 MED ORDER — DOXYCYCLINE HYCLATE 50 MG PO CAPS: 50.0000 mg | ORAL_CAPSULE | Freq: Three times a day (TID) | ORAL | 0 refills | Status: DC

## 2019-08-04 MED ORDER — DEXAMETHASONE 2 MG PO TABS: 2.0000 mg | ORAL_TABLET | Freq: Two times a day (BID) | ORAL | 0 refills | Status: DC

## 2019-08-04 NOTE — Progress Notes (Signed)
Virtual Visit via Video Note  I connected with Jennifer [name] on 08/04/19 at 11:30 AM EST by a video enabled telemedicine application and verified that I am speaking with the correct person using two identifiers.   I discussed the limitations of evaluation and management by telemedicine and the availability of in person appointments. The patient expressed understanding and agreed to proceed.  Subjective:    CC: COVID.   HPI: 2 week for croupy cough. Has been to ED 5 times and tested negative for COVID. Very congested. Using Affrin some.  Coughing up mucous.  Using her inhaler some. Tender over the anterior neck and swollen. + chills but no fever.  Negative for flu per her report.  Some occ wheezing in her chest.  Cough hurts chest.  Has been using insopiratory/flutter valve.  She feels like she is getting worse. She is using T[place] well. .   Past medical history, Surgical history, Family history not pertinant except as noted below, Social history, Allergies, and medications have been entered into the medical record, reviewed, and corrections made.   Review of Systems: No fevers, chills, night sweats, weight loss, chest pain, or shortness of breath.   Objective:    General: Speaking clearly in complete sentences without any shortness of breath.  Alert and oriented x3.  Normal judgment. No apparent acute distress.  She is lying on the couch.    Impression and Recommendations:    Sinobronchitis -discussed diagnosis at this point she has had symptoms for 2 weeks and feels like she is actually getting worse.  Suspect this still may be viral but I do not think is unreasonable to go ahead and treat with an antibiotic at this point as she is having severe congestion.  Okay to use Afrin for short period of time but not for prolonged use.  Continue with nasal saline rinse.  Go ahead and treat with doxycycline.  She was actually given a course not that long ago but never finished it and I do  wonder if some of this may be rebound from that.  Also she can take oral dexamethasone so we will send over prescription to that for the local pharmacy as well.  Atypical chest pain-did give her reassurance that if she has been coughing a lot but that is not unusual at all to have a sore chest and try to reassure her that I do not think that it is cardiac related at all.      I discussed the assessment and treatment plan with the patient. The patient was provided an opportunity to ask questions and all were answered. The patient agreed with the plan and demonstrated an understanding of the instructions.   The patient was advised to call back or seek an in-person evaluation if the symptoms worsen or if the condition fails to improve as anticipated.   Beatrice Lecher, MD

## 2019-08-04 NOTE — Progress Notes (Signed)
Pt would like to discuss possibly starting an ABX.   She still feels SOB. She was seen on (2x) 07/30/2019,(2x) 08/01/2019,and 08/03/2019 and was tested for COVID on 07/30/2019   She coughs really bad in the mornings. Taking tylenol and tried some affrin.

## 2019-08-05 DIAGNOSIS — Z87891 Personal history of nicotine dependence: Secondary | ICD-10-CM | POA: Diagnosis not present

## 2019-08-05 DIAGNOSIS — R0602 Shortness of breath: Secondary | ICD-10-CM | POA: Diagnosis not present

## 2019-08-05 DIAGNOSIS — R05 Cough: Secondary | ICD-10-CM | POA: Diagnosis not present

## 2019-08-05 DIAGNOSIS — J209 Acute bronchitis, unspecified: Secondary | ICD-10-CM | POA: Diagnosis not present

## 2019-08-05 DIAGNOSIS — R112 Nausea with vomiting, unspecified: Secondary | ICD-10-CM | POA: Diagnosis not present

## 2019-08-06 ENCOUNTER — Other Ambulatory Visit: Payer: Self-pay | Admitting: *Deleted

## 2019-08-06 ENCOUNTER — Encounter: Payer: Self-pay | Admitting: Family Medicine

## 2019-08-06 DIAGNOSIS — R05 Cough: Secondary | ICD-10-CM | POA: Diagnosis not present

## 2019-08-06 DIAGNOSIS — R112 Nausea with vomiting, unspecified: Secondary | ICD-10-CM | POA: Diagnosis not present

## 2019-08-06 DIAGNOSIS — R0602 Shortness of breath: Secondary | ICD-10-CM | POA: Diagnosis not present

## 2019-08-06 MED ORDER — FEXOFENADINE HCL 60 MG PO TABS: 60.0000 mg | ORAL_TABLET | Freq: Two times a day (BID) | ORAL | 0 refills | Status: DC

## 2019-08-07 ENCOUNTER — Encounter (HOSPITAL_BASED_OUTPATIENT_CLINIC_OR_DEPARTMENT_OTHER): Payer: Self-pay | Admitting: Emergency Medicine

## 2019-08-07 ENCOUNTER — Emergency Department (HOSPITAL_BASED_OUTPATIENT_CLINIC_OR_DEPARTMENT_OTHER)
Admission: EM | Admit: 2019-08-07 | Discharge: 2019-08-07 | Disposition: A | Discharge status: Home / Self Care | Payer: Medicare HMO | Attending: Emergency Medicine | Admitting: Emergency Medicine

## 2019-08-07 ENCOUNTER — Other Ambulatory Visit: Payer: Self-pay

## 2019-08-07 DIAGNOSIS — G35 Multiple sclerosis: Secondary | ICD-10-CM | POA: Insufficient documentation

## 2019-08-07 DIAGNOSIS — R569 Unspecified convulsions: Secondary | ICD-10-CM | POA: Diagnosis not present

## 2019-08-07 DIAGNOSIS — Z87891 Personal history of nicotine dependence: Secondary | ICD-10-CM | POA: Diagnosis not present

## 2019-08-07 DIAGNOSIS — R05 Cough: Secondary | ICD-10-CM | POA: Diagnosis present

## 2019-08-07 DIAGNOSIS — R000 Tachycardia, unspecified: Secondary | ICD-10-CM | POA: Diagnosis not present

## 2019-08-07 DIAGNOSIS — I10 Essential (primary) hypertension: Secondary | ICD-10-CM | POA: Insufficient documentation

## 2019-08-07 DIAGNOSIS — E876 Hypokalemia: Secondary | ICD-10-CM | POA: Diagnosis not present

## 2019-08-07 DIAGNOSIS — R0609 Other forms of dyspnea: Secondary | ICD-10-CM | POA: Diagnosis not present

## 2019-08-07 DIAGNOSIS — Z79899 Other long term (current) drug therapy: Secondary | ICD-10-CM | POA: Diagnosis not present

## 2019-08-07 DIAGNOSIS — J4530 Mild persistent asthma, uncomplicated: Secondary | ICD-10-CM | POA: Insufficient documentation

## 2019-08-07 DIAGNOSIS — R0600 Dyspnea, unspecified: Secondary | ICD-10-CM | POA: Diagnosis not present

## 2019-08-07 LAB — CBC WITH DIFFERENTIAL/PLATELET
Abs Immature Granulocytes: 0.02 10*3/uL (ref 0.00–0.07)
Basophils Absolute: 0.1 10*3/uL (ref 0.0–0.1)
Basophils Relative: 1 %
Eosinophils Absolute: 0.2 10*3/uL (ref 0.0–0.5)
Eosinophils Relative: 2 %
HCT: 42.9 % (ref 36.0–46.0)
Hemoglobin: 14 g/dL (ref 12.0–15.0)
Immature Granulocytes: 0 %
Lymphocytes Relative: 18 %
Lymphs Abs: 1.6 10*3/uL (ref 0.7–4.0)
MCH: 29.2 pg (ref 26.0–34.0)
MCHC: 32.6 g/dL (ref 30.0–36.0)
MCV: 89.4 fL (ref 80.0–100.0)
Monocytes Absolute: 0.6 10*3/uL (ref 0.1–1.0)
Monocytes Relative: 6 %
Neutro Abs: 6.4 10*3/uL (ref 1.7–7.7)
Neutrophils Relative %: 73 %
Platelets: 202 10*3/uL (ref 150–400)
RBC: 4.8 MIL/uL (ref 3.87–5.11)
RDW: 13.4 % (ref 11.5–15.5)
WBC: 8.7 10*3/uL (ref 4.0–10.5)
nRBC: 0 % (ref 0.0–0.2)

## 2019-08-07 LAB — BASIC METABOLIC PANEL
Anion gap: 8 (ref 5–15)
BUN: 15 mg/dL (ref 6–20)
CO2: 21 mmol/L — ABNORMAL LOW (ref 22–32)
Calcium: 8.7 mg/dL — ABNORMAL LOW (ref 8.9–10.3)
Chloride: 109 mmol/L (ref 98–111)
Creatinine, Ser: 0.66 mg/dL (ref 0.44–1.00)
GFR calc Af Amer: >60 mL/min (ref >60)
GFR calc non Af Amer: >60 mL/min (ref >60)
Glucose, Bld: 154 mg/dL — ABNORMAL HIGH (ref 70–99)
Potassium: 3 mmol/L — ABNORMAL LOW (ref 3.5–5.1)
Sodium: 138 mmol/L (ref 135–145)

## 2019-08-07 LAB — MAGNESIUM: Magnesium: 2.2 mg/dL (ref 1.7–2.4)

## 2019-08-07 LAB — D-DIMER, QUANTITATIVE: D-Dimer, Quant: 0.36 ug/mL-FEU (ref 0.00–0.50)

## 2019-08-07 MED ORDER — MAGNESIUM OXIDE 400 (241.3 MG) MG PO TABS
400.0000 mg | ORAL_TABLET | Freq: Once | ORAL | Status: AC
  Administered 2019-08-07: 400 mg via ORAL
  Filled 2019-08-07: qty 1

## 2019-08-07 MED ORDER — POTASSIUM CHLORIDE CRYS ER 20 MEQ PO TBCR
40.0000 meq | EXTENDED_RELEASE_TABLET | Freq: Once | ORAL | Status: DC
  Filled 2019-08-07: qty 2

## 2019-08-07 NOTE — ED Triage Notes (Signed)
Cough and SOB for "awhile". Seen several times for same. Neg COVID test.
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
## 2019-08-07 NOTE — Discharge Instructions (Signed)
Keep taking medications as prescribed. Bronchitis sometimes takes several weeks to completely resolve. Follow-up with your PCP.

## 2019-08-09 DIAGNOSIS — Z87891 Personal history of nicotine dependence: Secondary | ICD-10-CM | POA: Diagnosis not present

## 2019-08-09 DIAGNOSIS — R05 Cough: Secondary | ICD-10-CM | POA: Diagnosis not present

## 2019-08-09 DIAGNOSIS — R69 Illness, unspecified: Secondary | ICD-10-CM | POA: Diagnosis not present

## 2019-08-09 DIAGNOSIS — R9431 Abnormal electrocardiogram [ECG] [EKG]: Secondary | ICD-10-CM | POA: Diagnosis not present

## 2019-08-09 DIAGNOSIS — R112 Nausea with vomiting, unspecified: Secondary | ICD-10-CM | POA: Diagnosis not present

## 2019-08-09 DIAGNOSIS — R0602 Shortness of breath: Secondary | ICD-10-CM | POA: Diagnosis not present

## 2019-08-09 DIAGNOSIS — F54 Psychological and behavioral factors associated with disorders or diseases classified elsewhere: Secondary | ICD-10-CM | POA: Diagnosis not present

## 2019-08-09 DIAGNOSIS — R1310 Dysphagia, unspecified: Secondary | ICD-10-CM | POA: Diagnosis not present

## 2019-08-09 DIAGNOSIS — R202 Paresthesia of skin: Secondary | ICD-10-CM | POA: Diagnosis not present

## 2019-08-10 DIAGNOSIS — R9431 Abnormal electrocardiogram [ECG] [EKG]: Secondary | ICD-10-CM | POA: Diagnosis not present

## 2019-08-11 ENCOUNTER — Ambulatory Visit: Payer: Medicare HMO | Admitting: Pediatrics

## 2019-08-11 ENCOUNTER — Encounter: Payer: Self-pay | Admitting: Family Medicine

## 2019-08-11 ENCOUNTER — Ambulatory Visit: Payer: Medicare HMO | Admitting: Family Medicine

## 2019-08-11 ENCOUNTER — Ambulatory Visit (INDEPENDENT_AMBULATORY_CARE_PROVIDER_SITE_OTHER): Payer: Medicare HMO | Admitting: Family Medicine

## 2019-08-11 ENCOUNTER — Other Ambulatory Visit: Payer: Self-pay

## 2019-08-11 VITALS — BP 160/80 | HR 112 | Temp 98.9°F | Resp 16

## 2019-08-11 DIAGNOSIS — Z79899 Other long term (current) drug therapy: Secondary | ICD-10-CM

## 2019-08-11 DIAGNOSIS — J40 Bronchitis, not specified as acute or chronic: Secondary | ICD-10-CM

## 2019-08-11 DIAGNOSIS — I10 Essential (primary) hypertension: Secondary | ICD-10-CM | POA: Diagnosis not present

## 2019-08-11 DIAGNOSIS — F332 Major depressive disorder, recurrent severe without psychotic features: Secondary | ICD-10-CM | POA: Diagnosis not present

## 2019-08-11 DIAGNOSIS — K219 Gastro-esophageal reflux disease without esophagitis: Secondary | ICD-10-CM | POA: Diagnosis not present

## 2019-08-11 DIAGNOSIS — R69 Illness, unspecified: Secondary | ICD-10-CM | POA: Diagnosis not present

## 2019-08-11 DIAGNOSIS — J383 Other diseases of vocal cords: Secondary | ICD-10-CM

## 2019-08-11 DIAGNOSIS — J4521 Mild intermittent asthma with (acute) exacerbation: Secondary | ICD-10-CM

## 2019-08-11 DIAGNOSIS — J310 Chronic rhinitis: Secondary | ICD-10-CM

## 2019-08-11 MED ORDER — GUAIFENESIN ER 600 MG PO TB12: 600.0000 mg | ORAL_TABLET | Freq: Two times a day (BID) | ORAL | 1 refills | Status: DC | PRN

## 2019-08-11 MED ORDER — DEXAMETHASONE SODIUM PHOSPHATE 10 MG/ML IJ SOLN
2.0000 mg | Freq: Once | INTRAMUSCULAR | Status: AC
  Administered 2019-08-11: 2 mg via INTRAMUSCULAR

## 2019-08-11 MED ORDER — LEVALBUTEROL TARTRATE 45 MCG/ACT IN AERO: 1.0000 | INHALATION_SPRAY | Freq: Four times a day (QID) | RESPIRATORY_TRACT | 1 refills | Status: DC | PRN

## 2019-08-11 NOTE — Patient Instructions (Addendum)
Asthma Continue Xopenex 2 puffs every 6 hours as needed for cough or wheeze  Allergic rhinitis Begin azelastine 2 sprays in each nostril twice a day as needed for a runny nose Begin Flonase 1-2 sprays in each nostril once a day  Begin nasal saline rinses once a day. Use this before you use Flonase  Cough Begin Mucinex [phone] mg twice a day to this mucus Increase hydration as tolerated to thin mucus Continue Doxycycline that was previously prescribed for your cough  Reflux Begin famotidine 20 mg twice a day to control reflux. This may help with cough and wheeze Begin dietary and lifestyle modifications as listed below  Call the clinic if this treatment plan is not working well for you  Follow up in 2 months or sooner if needed.

## 2019-08-11 NOTE — Progress Notes (Signed)
[place] [address] Dept: [phone]  FOLLOW UP NOTE  Patient ID: Jennifer [name], female    DOB: May 22, 1966  Age: 53 y.o. MRN: [id number] Date of Office Visit: 08/11/2019  Assessment  Chief Complaint: Asthma  HPI Jennifer [name] is a 53 year old female who presents to the clinic for an evaluation of cough which she reports started in early December. She reports occasional shortness of breath, cough in the morning and evening with clear to white mucus, and occasional wheeze with forced exhalation.  She reports that she is using Xopenex about once a day for the last 2 weeks with little to no improvement in her breathing. She reports that she received a decadron injection from her primary care provider earlier today and she is also finishing a round of Doxycycline with a few days left to go. She reports allergic rhinitis as not well controlled with thick post nasal drainage and cough. She is occasionally using nasal saline rinses and Tussin which have not provided relief. Reflux is reported as "fair" and occurs occasionally for which she uses Tums with relief of symptoms. Her current medications are listed in the chart.    Drug Allergies:  Allergies  Allergen Reactions  . Azithromycin Shortness Of Breath    Lip swelling, SOB.     . Ciprofloxacin Swelling    REACTION: tongue swells  . Codeine Shortness Of Breath  . Milk-Related Compounds Swelling    Throat feels tight  . Mushroom Extract Complex Other (See Comments), Anaphylaxis and Rash    Didn't feel right Per allergist do not take  . Sulfa Antibiotics Shortness Of Breath, Rash and Other (See Comments)  . Sulfasalazine Rash and Shortness Of Breath    Other reaction(s): Other (See Comments) Other reaction(s): SHORTNESS OF BREATH  . Telmisartan Swelling    Tongue swelling, Micardis  . Ace Inhibitors Cough  . Aspirin Hives and Other (See Comments)    flushing  . Avelox [Moxifloxacin Hcl In Nacl] Itching     . Beta Adrenergic Blockers Other (See Comments)    Feels like chest tightening labetalol, bystolic  Feels like chest tightening "Metoprolol"   . Buspar [Buspirone] Other (See Comments)    Light headed  . Butorphanol Tartrate Other (See Comments)    Patient aggitated  . Cetirizine Hives and Rash       . Clonidine Hcl     REACTION: makes blood pressure high  . Cortisone     Feels like she is going crazy  . Erythromycin Rash  . Fentanyl Other (See Comments)    aggressive   . Fluoxetine Hcl Other (See Comments)    REACTION: headaches  . Ketorolac Tromethamine     jittery  . Lidocaine Other (See Comments)    When it involves the throat,   . Lisinopril Cough  . Metoclopramide Hcl Other (See Comments)    Dystonic reaction  . Midazolam Other (See Comments)    agitation Slow to wake up  . Montelukast Other (See Comments)    Singulair  . Montelukast Sodium Other (See Comments)    DOES NOT REMEMBER  Don't remember-told not to take  . Naproxen Other (See Comments)    FLUSHING  . Paroxetine Other (See Comments)    REACTION: headaches  . Penicillins Rash  . Pravastatin Other (See Comments)    Myalgias  . Promethazine Other (See Comments)    Dystonic reaction  . Promethazine Hcl Other (See Comments)    jittery  .  Quinolones Swelling and Rash  . Serotonin Reuptake Inhibitors (Ssris) Other (See Comments)    Headache Effexor, prozac, zoloft,   . Sertraline Hcl     REACTION: headaches  . Stelazine [Trifluoperazine] Other (See Comments)    Dystonic reaction  . Tobramycin Itching and Rash  . Trifluoperazine Hcl     dystonic  . Whey     Milk allergy  . Propoxyphene   . Adhesive [Tape] Rash    EKG monitor patches, some tapes Blisters, rash, itching, welts.  . Butorphanol Anxiety    Patient agitated  . Ceftriaxone Rash    rocephin  . Erythromycin Base Itching and Rash  . Iron Rash    Flushing with certain IV types  . Metoclopramide Itching and Other (See Comments)     Dystonic reaction  . Metronidazole Rash  . Other Rash and Other (See Comments)    Uncoded Allergy. Allergen: steriods, Other Reaction: Not Assessed Other reaction(s): Flushing (ALLERGY/intolerance), GI Upset (intolerance), Hypertension (intolerance), Increased Heart Rate (intolerance), Mental Status Changes (intolerance), Other (See Comments), Tachycardia / Palpitations(intolerance) Hospital gowns leave a rash.   . Prednisone Anxiety and Palpitations  . Prochlorperazine Anxiety    Compazine:  Dystonic reaction  . Venlafaxine Anxiety  . Zyrtec [Cetirizine Hcl] Rash    All over body    Physical Exam: BP (!) 160/80 (Cuff Size: Large)   Pulse (!) 112   Temp 98.9 F (37.2 C) (Oral)   Resp 16   LMP 06/25/2013   SpO2 96%    Physical Exam Vitals reviewed.  Constitutional:      Appearance: Normal appearance.  HENT:     Head: Normocephalic and atraumatic.     Right Ear: Tympanic membrane normal.     Left Ear: Tympanic membrane normal.     Nose:     Comments: Bilateral nares slightly erythematous with clear nasal drainage noted. Pharynx normal. Ears normal. Eyes normal.    Mouth/Throat:     Pharynx: Oropharynx is clear.  Eyes:     Conjunctiva/sclera: Conjunctivae normal.  Cardiovascular:     Rate and Rhythm: Normal rate and regular rhythm.     Heart sounds: Normal heart sounds. No murmur.  Pulmonary:     Effort: Pulmonary effort is normal.     Breath sounds: Normal breath sounds.     Comments: Lungs clear to auscultation Musculoskeletal:        General: Normal range of motion.     Cervical back: Normal range of motion and neck supple.  Skin:    General: Skin is warm and dry.  Neurological:     Mental Status: She is alert and oriented to person, place, and time.  Psychiatric:        Mood and Affect: Mood normal.        Behavior: Behavior normal.        Thought Content: Thought content normal.        Judgment: Judgment normal.     Diagnostics: FVC 3.06, FEV1 2.66.  Predicted FVC 3.21, predicted FEV1 2.53. Spirometry indicates normal ventilatory function.    Assessment and Plan: 1. Mild intermittent asthma with acute exacerbation   2. Chronic rhinitis   3. Current use of beta blocker   4. Essential hypertension   5. Vocal cord dysfunction   6. Gastroesophageal reflux disease without esophagitis     Meds ordered this encounter  Medications  . levalbuterol (XOPENEX HFA) 45 MCG/ACT inhaler    Sig: Inhale 1-2 puffs into the lungs every  6 (six) hours as needed (for coughing or wheezing spells).    Dispense:  1 Inhaler    Refill:  1    Patient Instructions  Asthma Continue Xopenex 2 puffs every 6 hours as needed for cough or wheeze  Allergic rhinitis Begin azelastine 2 sprays in each nostril twice a day as needed for a runny nose Begin Flonase 1-2 sprays in each nostril once a day  Begin nasal saline rinses once a day. Use this before you use Flonase  Cough Begin Mucinex [phone] mg twice a day to this mucus Increase hydration as tolerated to thin mucus Continue Doxycycline that was previously prescribed for your cough  Reflux Begin famotidine 20 mg twice a day to control reflux. This may help with cough and wheeze Begin dietary and lifestyle modifications as listed below  Call the clinic if this treatment plan is not working well for you  Follow up in 2 months or sooner if needed.   Return in about 2 months (around 10/12/2019), or if symptoms worsen or fail to improve.   Thank you for the opportunity to care for this patient.  Please do not hesitate to contact me with questions.  Gareth Morgan, FNP [facility]  I have provided oversight concerning Gareth Morgan' evaluation and treatment of this patient's health issues addressed during today's encounter. I agree with the assessment and therapeutic plan as outlined in the note.   Thank you for the opportunity to care for this patient.   Please do not hesitate to contact me with questions.  Penne Lash, M.D.  [facility] [address] [place], [place] [address] [phone]
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
## 2019-08-12 ENCOUNTER — Ambulatory Visit: Payer: Medicare HMO | Admitting: Family Medicine

## 2019-08-13 DIAGNOSIS — R0602 Shortness of breath: Secondary | ICD-10-CM | POA: Diagnosis not present

## 2019-08-13 DIAGNOSIS — R079 Chest pain, unspecified: Secondary | ICD-10-CM | POA: Diagnosis not present

## 2019-08-13 DIAGNOSIS — R69 Illness, unspecified: Secondary | ICD-10-CM | POA: Diagnosis not present

## 2019-08-13 DIAGNOSIS — I10 Essential (primary) hypertension: Secondary | ICD-10-CM | POA: Diagnosis not present

## 2019-08-13 DIAGNOSIS — R42 Dizziness and giddiness: Secondary | ICD-10-CM | POA: Diagnosis not present

## 2019-08-13 DIAGNOSIS — Z87891 Personal history of nicotine dependence: Secondary | ICD-10-CM | POA: Diagnosis not present

## 2019-08-13 DIAGNOSIS — R5383 Other fatigue: Secondary | ICD-10-CM | POA: Diagnosis not present

## 2019-08-14 ENCOUNTER — Other Ambulatory Visit: Payer: Self-pay | Admitting: *Deleted

## 2019-08-14 ENCOUNTER — Telehealth: Payer: Self-pay | Admitting: Family Medicine

## 2019-08-14 DIAGNOSIS — H5213 Myopia, bilateral: Secondary | ICD-10-CM | POA: Diagnosis not present

## 2019-08-14 DIAGNOSIS — H52209 Unspecified astigmatism, unspecified eye: Secondary | ICD-10-CM | POA: Diagnosis not present

## 2019-08-14 DIAGNOSIS — H524 Presbyopia: Secondary | ICD-10-CM | POA: Diagnosis not present

## 2019-08-14 MED ORDER — HYDROCORTISONE (PERIANAL) 2.5 % EX CREA: 1.0000 "application " | TOPICAL_CREAM | Freq: Two times a day (BID) | CUTANEOUS | 3 refills | Status: DC

## 2019-08-14 NOTE — Telephone Encounter (Signed)
Patient is wanting a cream that she had before for her buttocks. She would like for Tonya to call her today and let her know because she states she needs this called in [place]

## 2019-08-18 NOTE — Telephone Encounter (Signed)
Medication sent, pt advised.Jennifer [name], Lahoma Crocker, CMA

## 2019-08-19 ENCOUNTER — Telehealth (INDEPENDENT_AMBULATORY_CARE_PROVIDER_SITE_OTHER): Payer: Medicare HMO | Admitting: Family Medicine

## 2019-08-19 ENCOUNTER — Ambulatory Visit: Payer: Medicare HMO | Admitting: Family Medicine

## 2019-08-19 VITALS — BP 159/90

## 2019-08-19 DIAGNOSIS — R053 Chronic cough: Secondary | ICD-10-CM

## 2019-08-19 DIAGNOSIS — R6889 Other general symptoms and signs: Secondary | ICD-10-CM

## 2019-08-19 DIAGNOSIS — F329 Major depressive disorder, single episode, unspecified: Secondary | ICD-10-CM

## 2019-08-19 DIAGNOSIS — R05 Cough: Secondary | ICD-10-CM

## 2019-08-19 DIAGNOSIS — R1110 Vomiting, unspecified: Secondary | ICD-10-CM | POA: Diagnosis not present

## 2019-08-19 DIAGNOSIS — J40 Bronchitis, not specified as acute or chronic: Secondary | ICD-10-CM | POA: Diagnosis not present

## 2019-08-19 DIAGNOSIS — R6881 Early satiety: Secondary | ICD-10-CM | POA: Diagnosis not present

## 2019-08-19 DIAGNOSIS — R0989 Other specified symptoms and signs involving the circulatory and respiratory systems: Secondary | ICD-10-CM

## 2019-08-19 DIAGNOSIS — R1013 Epigastric pain: Secondary | ICD-10-CM | POA: Diagnosis not present

## 2019-08-19 LAB — COMPREHENSIVE METABOLIC PANEL
Calcium: 8.6 — AB (ref 8.7–10.7)
GFR calc non Af Amer: >=90

## 2019-08-19 LAB — BASIC METABOLIC PANEL
BUN: 11 (ref 4–21)
CO2: 24 — AB (ref 13–22)
Chloride: 109 — AB (ref 99–108)
Creatinine: 0.6 (ref 0.5–1.1)
Glucose: 127
Potassium: 3.7 (ref 3.4–5.3)
Sodium: 140 (ref 137–147)

## 2019-08-19 MED ORDER — NYSTATIN 100000 UNIT/ML MT SUSP: 5.0000 mL | Freq: Four times a day (QID) | OROMUCOSAL | 0 refills | Status: DC

## 2019-08-19 MED ORDER — OMEPRAZOLE 40 MG PO CPDR: 40.0000 mg | DELAYED_RELEASE_CAPSULE | Freq: Every day | ORAL | 0 refills | Status: DC

## 2019-08-19 NOTE — Progress Notes (Signed)
Virtual Visit via Video Note  I connected with Jennifer [name] on 08/20/19 at  3:20 PM EST by a video enabled telemedicine application and verified that I am speaking with the correct person using two identifiers.   I discussed the limitations of evaluation and management by telemedicine and the availability of in person appointments. The patient expressed understanding and agreed to proceed.  Subjective:    CC: stil not feeling well   HPI: Still has persistent cough and some intermittent shortness of breath.  Last time she was at the emergency department was December 30th.  She is a little better but still has an intermittent cough and having some mid back pain.  Has been using her Peak Flow Meter and now back up to 500.  Says she got out of the house this weekend and did OK.  Felt tired and mostly laid down yesterday.    Still having some sinus congestion. Using Nasacort and nasal saline rinse. Says wearing a mask makes her feel more congestion.  Still having some occ phlegm.   Says has noticed fullness in her throat and white coating on her tongue.  Has had persistent sore throat.  She thinks she may have thrush again and would like to try nystatin swish and swallow.  Throat feels scratchy at times.   Depression - continues to struggles says has appt tomorrow via virtual platform. She has reached back out to her granddaughter but still hasn't seen her since 46.  Plans on giving her a present.     Past medical history, Surgical history, Family history not pertinant except as noted below, Social history, Allergies, and medications have been entered into the medical record, reviewed, and corrections made.   Review of Systems: No fevers, chills, night sweats, weight loss, chest pain, or shortness of breath.   Objective:    General: Speaking clearly in complete sentences without any shortness of breath.  Alert and oriented x3.  Normal judgment. No apparent acute distress.    Impression  and Recommendations:    Persistent cough - recommend trial of PPI and see if helps her cough. She also has upper airway syndrome.   Bronchitis - reminded her can take weeks to recover.    Throat fullness - possible thrush. Will treat with nystatin swish and swallow. If not improving can be seen in person.  Can eval for lymphadenopathy.    Depression - keep new pt appt tomorrow.       I discussed the assessment and treatment plan with the patient. The patient was provided an opportunity to ask questions and all were answered. The patient agreed with the plan and demonstrated an understanding of the instructions.   The patient was advised to call back or seek an in-person evaluation if the symptoms worsen or if the condition fails to improve as anticipated.   Beatrice Lecher, MD

## 2019-08-20 ENCOUNTER — Ambulatory Visit (HOSPITAL_COMMUNITY): Payer: Medicare HMO | Admitting: Psychiatry

## 2019-08-20 ENCOUNTER — Encounter: Payer: Self-pay | Admitting: Family Medicine

## 2019-08-21 ENCOUNTER — Encounter: Payer: Self-pay | Admitting: Family Medicine

## 2019-08-21 DIAGNOSIS — R0602 Shortness of breath: Secondary | ICD-10-CM | POA: Diagnosis not present

## 2019-08-21 DIAGNOSIS — R9431 Abnormal electrocardiogram [ECG] [EKG]: Secondary | ICD-10-CM | POA: Diagnosis not present

## 2019-08-21 DIAGNOSIS — R079 Chest pain, unspecified: Secondary | ICD-10-CM | POA: Diagnosis not present

## 2019-08-21 DIAGNOSIS — E876 Hypokalemia: Secondary | ICD-10-CM | POA: Diagnosis not present

## 2019-08-22 DIAGNOSIS — R9431 Abnormal electrocardiogram [ECG] [EKG]: Secondary | ICD-10-CM | POA: Diagnosis not present

## 2019-08-25 ENCOUNTER — Other Ambulatory Visit: Payer: Self-pay | Admitting: *Deleted

## 2019-08-25 DIAGNOSIS — I10 Essential (primary) hypertension: Secondary | ICD-10-CM

## 2019-08-25 NOTE — Patient Outreach (Signed)
Per Patient Jennifer [name] member has had 42 ED visits/hospitalizations since Dec. 2020. Per Patient Jennifer [name] most of her ED visits have been at [facility] and [facility].   Will make referral to [facility] RNCM due to member's frequent ED visits.    Marthenia Rolling, MSN-Ed, RN,BSN [place] Acute Care Coordinator [phone] [facility]) [phone]  (Toll free office)

## 2019-08-26 ENCOUNTER — Other Ambulatory Visit: Payer: Self-pay | Admitting: *Deleted

## 2019-08-26 NOTE — Patient Outreach (Signed)
[facility]) Care Management  08/26/2019  GENESISS BIEKER [birth year] [id number]   Telephone Screening Call  Referral Date : 08/26/19 Referral source: [facility] Acute Care Coordinator  Referral Reason : 13 ED visits/Hospitalization since Dec. 2020 Insurance: [place] attempt #1:  Unsuccessful outreach call to patient. No answer , received message voicemail is not set up, unable to leave a message.   Received text message to work phone asking who was calling, responded with [facility] care management - Received incoming text from patient, requesting call.  Placed call to patient no answer again unable to leave voicemail message.    Plan Will send unsuccessful outreach letter , and plan call return call attempt in the next 4 business days.    Joylene Draft, RN, [place] Management Coordinator  [phone]- Mobile [phone]- Toll Free Main Office

## 2019-08-28 DIAGNOSIS — R202 Paresthesia of skin: Secondary | ICD-10-CM | POA: Diagnosis not present

## 2019-08-28 DIAGNOSIS — M542 Cervicalgia: Secondary | ICD-10-CM | POA: Diagnosis not present

## 2019-08-28 DIAGNOSIS — M79602 Pain in left arm: Secondary | ICD-10-CM | POA: Diagnosis not present

## 2019-08-29 ENCOUNTER — Telehealth (INDEPENDENT_AMBULATORY_CARE_PROVIDER_SITE_OTHER): Payer: Medicare HMO | Admitting: Family Medicine

## 2019-08-29 ENCOUNTER — Other Ambulatory Visit: Payer: Self-pay | Admitting: *Deleted

## 2019-08-29 ENCOUNTER — Encounter: Payer: Self-pay | Admitting: Family Medicine

## 2019-08-29 DIAGNOSIS — I10 Essential (primary) hypertension: Secondary | ICD-10-CM

## 2019-08-29 DIAGNOSIS — Z87891 Personal history of nicotine dependence: Secondary | ICD-10-CM | POA: Diagnosis not present

## 2019-08-29 DIAGNOSIS — R42 Dizziness and giddiness: Secondary | ICD-10-CM | POA: Diagnosis not present

## 2019-08-29 DIAGNOSIS — R9431 Abnormal electrocardiogram [ECG] [EKG]: Secondary | ICD-10-CM | POA: Diagnosis not present

## 2019-08-29 DIAGNOSIS — R519 Headache, unspecified: Secondary | ICD-10-CM | POA: Diagnosis not present

## 2019-08-29 DIAGNOSIS — M542 Cervicalgia: Secondary | ICD-10-CM

## 2019-08-29 DIAGNOSIS — K219 Gastro-esophageal reflux disease without esophagitis: Secondary | ICD-10-CM | POA: Diagnosis not present

## 2019-08-29 DIAGNOSIS — Z79899 Other long term (current) drug therapy: Secondary | ICD-10-CM

## 2019-08-29 DIAGNOSIS — R030 Elevated blood-pressure reading, without diagnosis of hypertension: Secondary | ICD-10-CM | POA: Diagnosis not present

## 2019-08-29 DIAGNOSIS — R531 Weakness: Secondary | ICD-10-CM | POA: Diagnosis not present

## 2019-08-29 DIAGNOSIS — F329 Major depressive disorder, single episode, unspecified: Secondary | ICD-10-CM

## 2019-08-29 DIAGNOSIS — Z886 Allergy status to analgesic agent status: Secondary | ICD-10-CM | POA: Diagnosis not present

## 2019-08-29 DIAGNOSIS — M79602 Pain in left arm: Secondary | ICD-10-CM

## 2019-08-29 DIAGNOSIS — F419 Anxiety disorder, unspecified: Secondary | ICD-10-CM | POA: Diagnosis not present

## 2019-08-29 DIAGNOSIS — E785 Hyperlipidemia, unspecified: Secondary | ICD-10-CM | POA: Diagnosis not present

## 2019-08-29 DIAGNOSIS — Z888 Allergy status to other drugs, medicaments and biological substances status: Secondary | ICD-10-CM | POA: Diagnosis not present

## 2019-08-29 DIAGNOSIS — F332 Major depressive disorder, recurrent severe without psychotic features: Secondary | ICD-10-CM

## 2019-08-29 DIAGNOSIS — M79642 Pain in left hand: Secondary | ICD-10-CM | POA: Diagnosis not present

## 2019-08-29 DIAGNOSIS — G4733 Obstructive sleep apnea (adult) (pediatric): Secondary | ICD-10-CM | POA: Diagnosis not present

## 2019-08-29 DIAGNOSIS — E119 Type 2 diabetes mellitus without complications: Secondary | ICD-10-CM | POA: Diagnosis not present

## 2019-08-29 DIAGNOSIS — R079 Chest pain, unspecified: Secondary | ICD-10-CM | POA: Diagnosis not present

## 2019-08-29 NOTE — Patient Outreach (Signed)
[facility]) Care Management  08/29/2019  Jennifer [name] 1966-05-31 [id number]   Telephone Screening Call  Referral Date : 08/26/19 Referral source: [facility] Acute Care Coordinator  Referral Reason : 13 ED visits/Hospitalization since Dec. 2020 Insurance: [place] attempt #2:  Unsuccessful outreach call to patient. No answer , received message voicemail is not set up, unable to leave a message.   .  Plan Will plan 3rd telephone outreach in the next 4 business days.    Joylene Draft, RN, [place] Management Coordinator  [phone]- Mobile [phone]- Toll Free Main Office

## 2019-08-29 NOTE — Progress Notes (Signed)
She was seen at [facility] ed today. She reports that she has some L arm pain and decreased vision

## 2019-08-29 NOTE — Progress Notes (Signed)
Virtual Visit via Video Note  I connected with Jennifer [name] on 08/29/19 at  3:40 PM EST by a video enabled telemedicine application and verified that I am speaking with the correct person using two identifiers.   I discussed the limitations of evaluation and management by telemedicine and the availability of in person appointments. The patient expressed understanding and agreed to proceed.  Subjective:    CC: Several concerns  HPI: She c/o of Left arm pain. Pain when she gripped something and it felt weak. Has pain in her forearm.  No specific injury or trauma.  She says that the hand will almost feel numb cold at times.  She has been using heating pad and that does seem to help.  She has also been getting some severe pain at the base of her neck.  It is mostly centered directly over the spine.  She is also been getting some headaches that radiate up over the top of the head going up to her forehead right through the middle of her head.  She is also been noticing some change in vision in her right eye that occurs with the headaches.  She says that the most like a gray cloud over the eye.  She actually just had an eye exam a couple of months ago but did not have a dilated eye exam.  She took some Tylenol earlier today and says that it has helped her headache but has not completely gone it is just more mild.  She does have a history of migraines but really has not had problems with those in years.  But she had them quite frequently and severe enough that she would vomit and have light sensitivity when her son was younger.  She did seek emergency care and actually had department yesterday and then again today.  She says when she left the ED her systolic blood pressure was 188 and her bottom number was over 100.  She is also reporting some intermittent nausea.  When her BP would shoot up her ear will hurts as well. She has had nasal congestion as  Will.    Past medical history, Surgical history,  Family history not pertinant except as noted below, Social history, Allergies, and medications have been entered into the medical record, reviewed, and corrections made.   Review of Systems: No fevers, chills, night sweats, weight loss, chest pain, or shortness of breath.   Objective:    General: Speaking clearly in complete sentences without any shortness of breath.  Alert and oriented x3.  Normal judgment. No apparent acute distress.    Impression and Recommendations:    Hypertension-still uncontrolled.  Though she reports last week she actually had a week of really good blood pressures up until about 2 days ago when I have seen to spike and that is when she actually started feeling poorly.  She has not been taking her amlodipine because she felt like it was causing some intermittent tachycardia.  Though she has tachycardia even when she is not on medication.  Though it does sound like overall her blood pressures were better last week.  I do think some of her stress and pain levels are probably increasing her blood pressure as well.  But she also tends to swing sometimes. Ok to use amlodipine when BP high  Cervical pain-discussed options.  I think physical therapy would be really helpful for her.  It sounds like she has been trying to do some stretches.  She may  want to call and try to reschedule therapy appointment.  Depression/anxiety-she missed her virtual appointment because she thought she was supposed to login through my chart and then realize she was supposed to login through an email.  So she is asked again to reach out to [facility] to see if she can make contact with them.  Left arm/hand pain-unclear etiology.  She is planning on coming next week so we can take a further look at it.  It sounds like she is getting a cold sensation which could be a result of nerve impingement but she is also having some weakness and pain with movement which could be much more musculoskeletal.  Continue with  heating pad and Tylenol as needed.  Headache-explained that it could be cervicogenic especially since she is been having some significant neck pain but also could be a recurrence of her migraines.  For now okay to continue with Tylenol and tx her neck pain.        I discussed the assessment and treatment plan with the patient. The patient was provided an opportunity to ask questions and all were answered. The patient agreed with the plan and demonstrated an understanding of the instructions.   The patient was advised to call back or seek an in-person evaluation if the symptoms worsen or if the condition fails to improve as anticipated.  Time spent 45 min in encounter.  ED visits reviewed as part of review.   Beatrice Lecher, MD

## 2019-09-01 ENCOUNTER — Encounter: Payer: Self-pay | Admitting: Family Medicine

## 2019-09-01 ENCOUNTER — Emergency Department (HOSPITAL_BASED_OUTPATIENT_CLINIC_OR_DEPARTMENT_OTHER): Payer: Medicare HMO

## 2019-09-01 ENCOUNTER — Emergency Department (HOSPITAL_BASED_OUTPATIENT_CLINIC_OR_DEPARTMENT_OTHER)
Admission: EM | Admit: 2019-09-01 | Discharge: 2019-09-01 | Disposition: A | Discharge status: Home / Self Care | Payer: Medicare HMO | Attending: Emergency Medicine | Admitting: Emergency Medicine

## 2019-09-01 ENCOUNTER — Encounter (HOSPITAL_BASED_OUTPATIENT_CLINIC_OR_DEPARTMENT_OTHER): Payer: Self-pay | Admitting: *Deleted

## 2019-09-01 ENCOUNTER — Other Ambulatory Visit: Payer: Self-pay

## 2019-09-01 DIAGNOSIS — J45909 Unspecified asthma, uncomplicated: Secondary | ICD-10-CM | POA: Diagnosis not present

## 2019-09-01 DIAGNOSIS — Z20822 Contact with and (suspected) exposure to covid-19: Secondary | ICD-10-CM | POA: Insufficient documentation

## 2019-09-01 DIAGNOSIS — E785 Hyperlipidemia, unspecified: Secondary | ICD-10-CM | POA: Diagnosis not present

## 2019-09-01 DIAGNOSIS — I10 Essential (primary) hypertension: Secondary | ICD-10-CM | POA: Diagnosis not present

## 2019-09-01 DIAGNOSIS — R1013 Epigastric pain: Secondary | ICD-10-CM | POA: Diagnosis not present

## 2019-09-01 DIAGNOSIS — G35 Multiple sclerosis: Secondary | ICD-10-CM | POA: Diagnosis not present

## 2019-09-01 DIAGNOSIS — Z87891 Personal history of nicotine dependence: Secondary | ICD-10-CM | POA: Insufficient documentation

## 2019-09-01 DIAGNOSIS — R0602 Shortness of breath: Secondary | ICD-10-CM | POA: Diagnosis not present

## 2019-09-01 MED ORDER — ONDANSETRON 4 MG PO TBDP
4.0000 mg | ORAL_TABLET | Freq: Once | ORAL | Status: AC
  Administered 2019-09-01: 4 mg via ORAL
  Filled 2019-09-01: qty 1

## 2019-09-01 MED ORDER — ONDANSETRON 4 MG PO TBDP: 4.0000 mg | ORAL_TABLET | Freq: Three times a day (TID) | ORAL | 0 refills | Status: DC | PRN
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
## 2019-09-01 NOTE — Discharge Instructions (Addendum)
You were evaluated in the Emergency Department and after careful evaluation, we did not find any emergent condition requiring admission or further testing in the hospital.  Your exam/testing today is overall reassuring.  Your x-ray was normal.  We have tested you for the coronavirus, please continue home quarantine until you receive a negative result.  If positive you will need to continue home quarantine per [facility] recommendations.  Please return to the Emergency Department if you experience any worsening of your condition.  We encourage you to follow up with a primary care provider.  Thank you for allowing [place] to be a part of your care.

## 2019-09-01 NOTE — ED Notes (Signed)
ED Provider at bedside. 

## 2019-09-01 NOTE — ED Notes (Signed)
Patient transported to X-ray 

## 2019-09-01 NOTE — ED Triage Notes (Signed)
Pt c/o SOB and abd pain x 3 months

## 2019-09-02 ENCOUNTER — Other Ambulatory Visit: Payer: Self-pay

## 2019-09-02 LAB — SARS CORONAVIRUS 2 (TAT 6-24 HRS): SARS Coronavirus 2: NEGATIVE

## 2019-09-02 NOTE — Patient Outreach (Signed)
[place] [facility]) Care Management  09/02/2019  KHUSHBU BOWLDS 11/19/1965 [id number]   Incoming call from patient.  Explained the reason for the call and discussed [facility] services.  Patient adamantly refuses and states that it will not stop her from going to the ED if she feels she needs to.  Again reviewed benefits from nurse follow up call.  Patient again says she will go to the ED and does not need [facility] nurse follow up calls.   Plan: RN CM will close case.    Jone Baseman, RN, MSN [place] Management Care Management Coordinator Direct Line [phone] Cell [phone] Toll Free: [phone]  Fax: [phone]

## 2019-09-02 NOTE — Patient Outreach (Signed)
[place] [facility]) Care Management  09/02/2019  Jennifer [name] [birth year] [id number]   Telephone call to patient for referral follow up.  Frequent ED visits.  No answer.  Unable to leave a message.  Plan: RN CM will wait return call.   Jone Baseman, RN, MSN [place] Management Care Management Coordinator Direct Line [phone] Cell [phone] Toll Free: [phone]  Fax: [phone]

## 2019-09-04 ENCOUNTER — Telehealth: Payer: Self-pay | Admitting: Family Medicine

## 2019-09-04 DIAGNOSIS — D509 Iron deficiency anemia, unspecified: Secondary | ICD-10-CM

## 2019-09-04 NOTE — Telephone Encounter (Signed)
Patient called and is wanting a Iron and TIBC panel sent to [place]. She is wanting to go this morning for her labs. She reports they go to [place] for her labs. Please advise.

## 2019-09-04 NOTE — Telephone Encounter (Signed)
OK for labs. Please order and fax

## 2019-09-05 DIAGNOSIS — E876 Hypokalemia: Secondary | ICD-10-CM | POA: Diagnosis not present

## 2019-09-05 DIAGNOSIS — D509 Iron deficiency anemia, unspecified: Secondary | ICD-10-CM | POA: Diagnosis not present

## 2019-09-05 LAB — BASIC METABOLIC PANEL
BUN: 14 (ref 4–21)
CO2: 23 — AB (ref 13–22)
Chloride: 110 — AB (ref 99–108)
Creatinine: 0.8 (ref <=1.1)
Glucose: 133
Potassium: 4 (ref 3.4–5.3)
Sodium: 142 (ref 137–147)

## 2019-09-05 LAB — IRON AND TIBC
Iron Saturation: 12
Iron: 41
TIBC: 330
Transferrin: 236

## 2019-09-05 LAB — COMPREHENSIVE METABOLIC PANEL: Calcium: 9.2 (ref 8.7–10.7)

## 2019-09-05 NOTE — Telephone Encounter (Signed)
Patient is aware and did not have any questions.  

## 2019-09-05 NOTE — Telephone Encounter (Signed)
[facility]  Fax [phone]

## 2019-09-06 DIAGNOSIS — K219 Gastro-esophageal reflux disease without esophagitis: Secondary | ICD-10-CM | POA: Diagnosis not present

## 2019-09-06 DIAGNOSIS — R0789 Other chest pain: Secondary | ICD-10-CM | POA: Diagnosis not present

## 2019-09-06 DIAGNOSIS — R000 Tachycardia, unspecified: Secondary | ICD-10-CM | POA: Diagnosis not present

## 2019-09-06 DIAGNOSIS — R112 Nausea with vomiting, unspecified: Secondary | ICD-10-CM | POA: Diagnosis not present

## 2019-09-06 DIAGNOSIS — F419 Anxiety disorder, unspecified: Secondary | ICD-10-CM | POA: Diagnosis not present

## 2019-09-06 DIAGNOSIS — R1084 Generalized abdominal pain: Secondary | ICD-10-CM | POA: Diagnosis not present

## 2019-09-08 ENCOUNTER — Telehealth: Payer: Self-pay | Admitting: Family Medicine

## 2019-09-08 NOTE — Telephone Encounter (Signed)
Pt has virtual appt tomorrow. Will discuss then.Jennifer [name], [place]

## 2019-09-08 NOTE — Telephone Encounter (Signed)
Call patient: Did receive her blood work from the 22nd.  Iron studies are low at 41.  Please see if she is taking an iron supplement? Basic metabolic panel is normal.

## 2019-09-09 ENCOUNTER — Telehealth (INDEPENDENT_AMBULATORY_CARE_PROVIDER_SITE_OTHER): Payer: Medicare HMO | Admitting: Family Medicine

## 2019-09-09 VITALS — Ht 62.0 in | Wt 218.0 lb

## 2019-09-09 DIAGNOSIS — D509 Iron deficiency anemia, unspecified: Secondary | ICD-10-CM

## 2019-09-09 DIAGNOSIS — R1013 Epigastric pain: Secondary | ICD-10-CM

## 2019-09-09 DIAGNOSIS — E611 Iron deficiency: Secondary | ICD-10-CM | POA: Diagnosis not present

## 2019-09-09 DIAGNOSIS — I10 Essential (primary) hypertension: Secondary | ICD-10-CM | POA: Diagnosis not present

## 2019-09-09 DIAGNOSIS — E876 Hypokalemia: Secondary | ICD-10-CM

## 2019-09-09 LAB — BASIC METABOLIC PANEL
BUN: 13 (ref 4–21)
CO2: 27 — AB (ref 13–22)
Chloride: 107 (ref 99–108)
Creatinine: 0.6 (ref <=1.1)
Glucose: 118
Potassium: 3.7 (ref 3.4–5.3)
Sodium: 140 (ref 137–147)

## 2019-09-09 LAB — COMPREHENSIVE METABOLIC PANEL: Calcium: 8.9 (ref 8.7–10.7)

## 2019-09-09 MED ORDER — RANITIDINE HCL 15 MG/ML PO SYRP: 3.0000 mg/kg/d | ORAL_SOLUTION | Freq: Two times a day (BID) | ORAL | 1 refills | Status: DC

## 2019-09-09 MED ORDER — HYDRALAZINE HCL 10 MG PO TABS: 10.0000 mg | ORAL_TABLET | Freq: Three times a day (TID) | ORAL | 1 refills | Status: DC

## 2019-09-09 MED ORDER — METOPROLOL TARTRATE 25 MG PO TABS: 25.0000 mg | ORAL_TABLET | Freq: Two times a day (BID) | ORAL | 0 refills | Status: DC

## 2019-09-09 NOTE — Progress Notes (Signed)
Pt advised that her Iron studies are low at 41.  Pt stated that she is not because it upsets her stomach.   She stated that her ferritin wasn't checked and this is usually done. She asked if this could be sent over because she is unsure if she will be able to get the iron infusion without this being done.  Basic metabolic panel is normal.    Pt reports that she feels like her whole body hurts and aches.   She feels nauseated and having stomach problems again.  He last BM was this morning but she has had lots of gas and abdominal tightness.   She states that her potassium has been low also.  Pt has noticed that her diastolic number has been in the 100's
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
## 2019-09-11 DIAGNOSIS — R0602 Shortness of breath: Secondary | ICD-10-CM | POA: Diagnosis not present

## 2019-09-11 DIAGNOSIS — R000 Tachycardia, unspecified: Secondary | ICD-10-CM | POA: Diagnosis not present

## 2019-09-11 DIAGNOSIS — R112 Nausea with vomiting, unspecified: Secondary | ICD-10-CM | POA: Diagnosis not present

## 2019-09-11 DIAGNOSIS — D696 Thrombocytopenia, unspecified: Secondary | ICD-10-CM | POA: Diagnosis not present

## 2019-09-11 DIAGNOSIS — D72829 Elevated white blood cell count, unspecified: Secondary | ICD-10-CM | POA: Diagnosis not present

## 2019-09-11 DIAGNOSIS — D649 Anemia, unspecified: Secondary | ICD-10-CM | POA: Diagnosis not present

## 2019-09-11 DIAGNOSIS — R599 Enlarged lymph nodes, unspecified: Secondary | ICD-10-CM | POA: Diagnosis not present

## 2019-09-11 DIAGNOSIS — R1011 Right upper quadrant pain: Secondary | ICD-10-CM | POA: Diagnosis not present

## 2019-09-11 DIAGNOSIS — R1310 Dysphagia, unspecified: Secondary | ICD-10-CM | POA: Diagnosis not present

## 2019-09-11 DIAGNOSIS — R9431 Abnormal electrocardiogram [ECG] [EKG]: Secondary | ICD-10-CM | POA: Diagnosis not present

## 2019-09-11 DIAGNOSIS — R1013 Epigastric pain: Secondary | ICD-10-CM | POA: Diagnosis not present

## 2019-09-11 DIAGNOSIS — R1012 Left upper quadrant pain: Secondary | ICD-10-CM | POA: Diagnosis not present

## 2019-09-12 ENCOUNTER — Encounter: Payer: Self-pay | Admitting: Family Medicine

## 2019-09-12 ENCOUNTER — Emergency Department (HOSPITAL_BASED_OUTPATIENT_CLINIC_OR_DEPARTMENT_OTHER)
Admission: EM | Admit: 2019-09-12 | Discharge: 2019-09-12 | Disposition: A | Discharge status: Home / Self Care | Payer: Medicare HMO | Attending: Emergency Medicine | Admitting: Emergency Medicine

## 2019-09-12 ENCOUNTER — Other Ambulatory Visit: Payer: Self-pay

## 2019-09-12 ENCOUNTER — Ambulatory Visit (INDEPENDENT_AMBULATORY_CARE_PROVIDER_SITE_OTHER): Payer: Medicare HMO | Admitting: Family Medicine

## 2019-09-12 ENCOUNTER — Emergency Department (HOSPITAL_BASED_OUTPATIENT_CLINIC_OR_DEPARTMENT_OTHER): Payer: Medicare HMO

## 2019-09-12 ENCOUNTER — Encounter (HOSPITAL_BASED_OUTPATIENT_CLINIC_OR_DEPARTMENT_OTHER): Payer: Self-pay | Admitting: *Deleted

## 2019-09-12 VITALS — BP 146/74 | HR 91 | Wt 219.0 lb

## 2019-09-12 DIAGNOSIS — Z87891 Personal history of nicotine dependence: Secondary | ICD-10-CM | POA: Diagnosis not present

## 2019-09-12 DIAGNOSIS — R9431 Abnormal electrocardiogram [ECG] [EKG]: Secondary | ICD-10-CM | POA: Diagnosis not present

## 2019-09-12 DIAGNOSIS — E785 Hyperlipidemia, unspecified: Secondary | ICD-10-CM | POA: Insufficient documentation

## 2019-09-12 DIAGNOSIS — Z79899 Other long term (current) drug therapy: Secondary | ICD-10-CM | POA: Insufficient documentation

## 2019-09-12 DIAGNOSIS — Y999 Unspecified external cause status: Secondary | ICD-10-CM | POA: Diagnosis not present

## 2019-09-12 DIAGNOSIS — R1111 Vomiting without nausea: Secondary | ICD-10-CM | POA: Diagnosis not present

## 2019-09-12 DIAGNOSIS — S60221A Contusion of right hand, initial encounter: Secondary | ICD-10-CM | POA: Diagnosis not present

## 2019-09-12 DIAGNOSIS — W2209XA Striking against other stationary object, initial encounter: Secondary | ICD-10-CM | POA: Insufficient documentation

## 2019-09-12 DIAGNOSIS — K2100 Gastro-esophageal reflux disease with esophagitis, without bleeding: Secondary | ICD-10-CM | POA: Diagnosis not present

## 2019-09-12 DIAGNOSIS — Y939 Activity, unspecified: Secondary | ICD-10-CM | POA: Diagnosis not present

## 2019-09-12 DIAGNOSIS — I10 Essential (primary) hypertension: Secondary | ICD-10-CM | POA: Diagnosis not present

## 2019-09-12 DIAGNOSIS — J45909 Unspecified asthma, uncomplicated: Secondary | ICD-10-CM | POA: Diagnosis not present

## 2019-09-12 DIAGNOSIS — H9202 Otalgia, left ear: Secondary | ICD-10-CM

## 2019-09-12 DIAGNOSIS — Y92008 Other place in unspecified non-institutional (private) residence as the place of occurrence of the external cause: Secondary | ICD-10-CM | POA: Diagnosis not present

## 2019-09-12 DIAGNOSIS — Z881 Allergy status to other antibiotic agents status: Secondary | ICD-10-CM | POA: Insufficient documentation

## 2019-09-12 DIAGNOSIS — S6991XA Unspecified injury of right wrist, hand and finger(s), initial encounter: Secondary | ICD-10-CM | POA: Diagnosis not present

## 2019-09-12 DIAGNOSIS — R1313 Dysphagia, pharyngeal phase: Secondary | ICD-10-CM

## 2019-09-12 DIAGNOSIS — G35 Multiple sclerosis: Secondary | ICD-10-CM | POA: Insufficient documentation

## 2019-09-12 MED ORDER — SUCRALFATE 1 GM/10ML PO SUSP: 1.0000 g | Freq: Three times a day (TID) | ORAL | 0 refills | Status: DC

## 2019-09-12 MED ORDER — CIMETIDINE HCL 300 MG/5ML PO SOLN: 300.0000 mg | Freq: Two times a day (BID) | ORAL | 0 refills | Status: DC

## 2019-09-12 NOTE — Assessment & Plan Note (Signed)
I would still really like to see her take her combination of medications consistently and write those pressures down and bring them in.  Again reminded her that it is the overall control of the pressures.  That is most important.

## 2019-09-12 NOTE — Assessment & Plan Note (Signed)
We discussed that at this point I really think that the coughing and GERD is contributing to the swollen sensation that she is getting in her neck which is probably causing some inflammation which is making it feel like food is getting stuck more easily.  I think right now she is in a cycle of coughing vomiting and reflux and we really need to get this under control.  She is unable to take PPIs daily where they make her stomach hurt.  Unfortunately were not able to get the ranitidine liquid.  So may need to try Tagamet.  We will also send over prescription for Carafate liquid to be taken can before meals and at bedtime and will see if this helps.

## 2019-09-12 NOTE — ED Triage Notes (Signed)
Pt c/o right hand injury x 1 hr ago
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
## 2019-09-12 NOTE — Assessment & Plan Note (Signed)
Pressure still uncontrolled but not as high as it was when we spoke the other day.  A*only encouraged her to try starting the blood pressure pill over the weekend while her husband is home.  Follow-up in 2 weeks.

## 2019-09-13 IMAGING — DX DG FOOT COMPLETE 3+V*L*
3 series · 3 of 3 positions shown · non-contrast
Comparison: Left 5th toe series 03/04/2018.

CLINICAL DATA: 52-year-old female with left foot pain for 6 months.
Possible plantar fasciitis.

EXAM:
LEFT FOOT - COMPLETE 3+ VIEW

[foot ap]
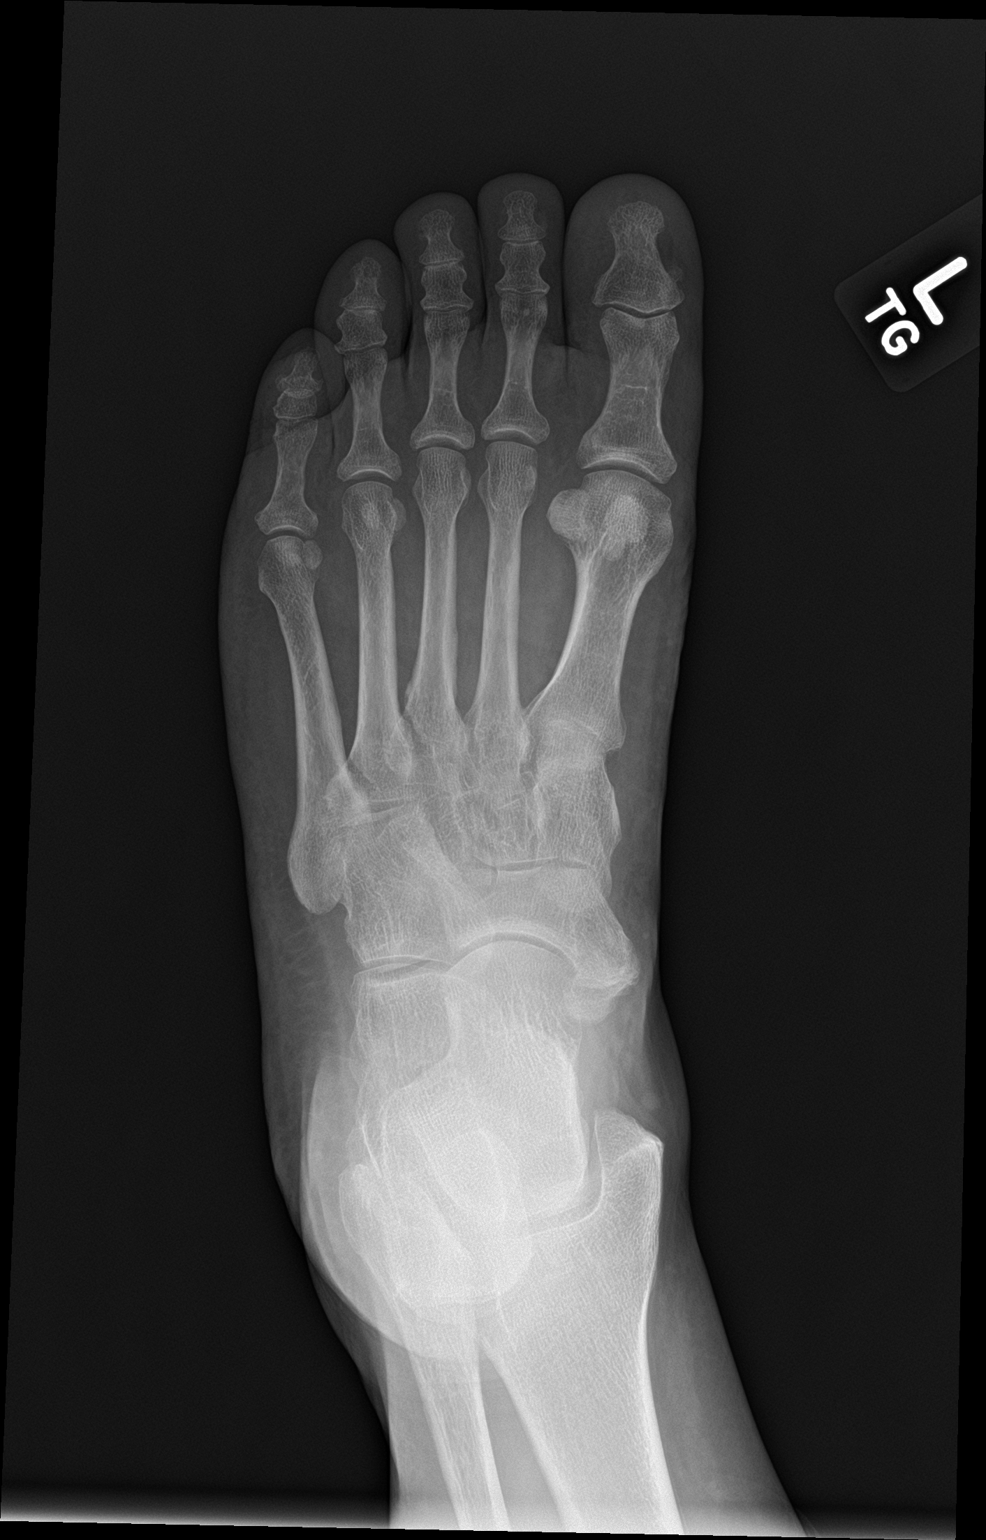

[foot obl]
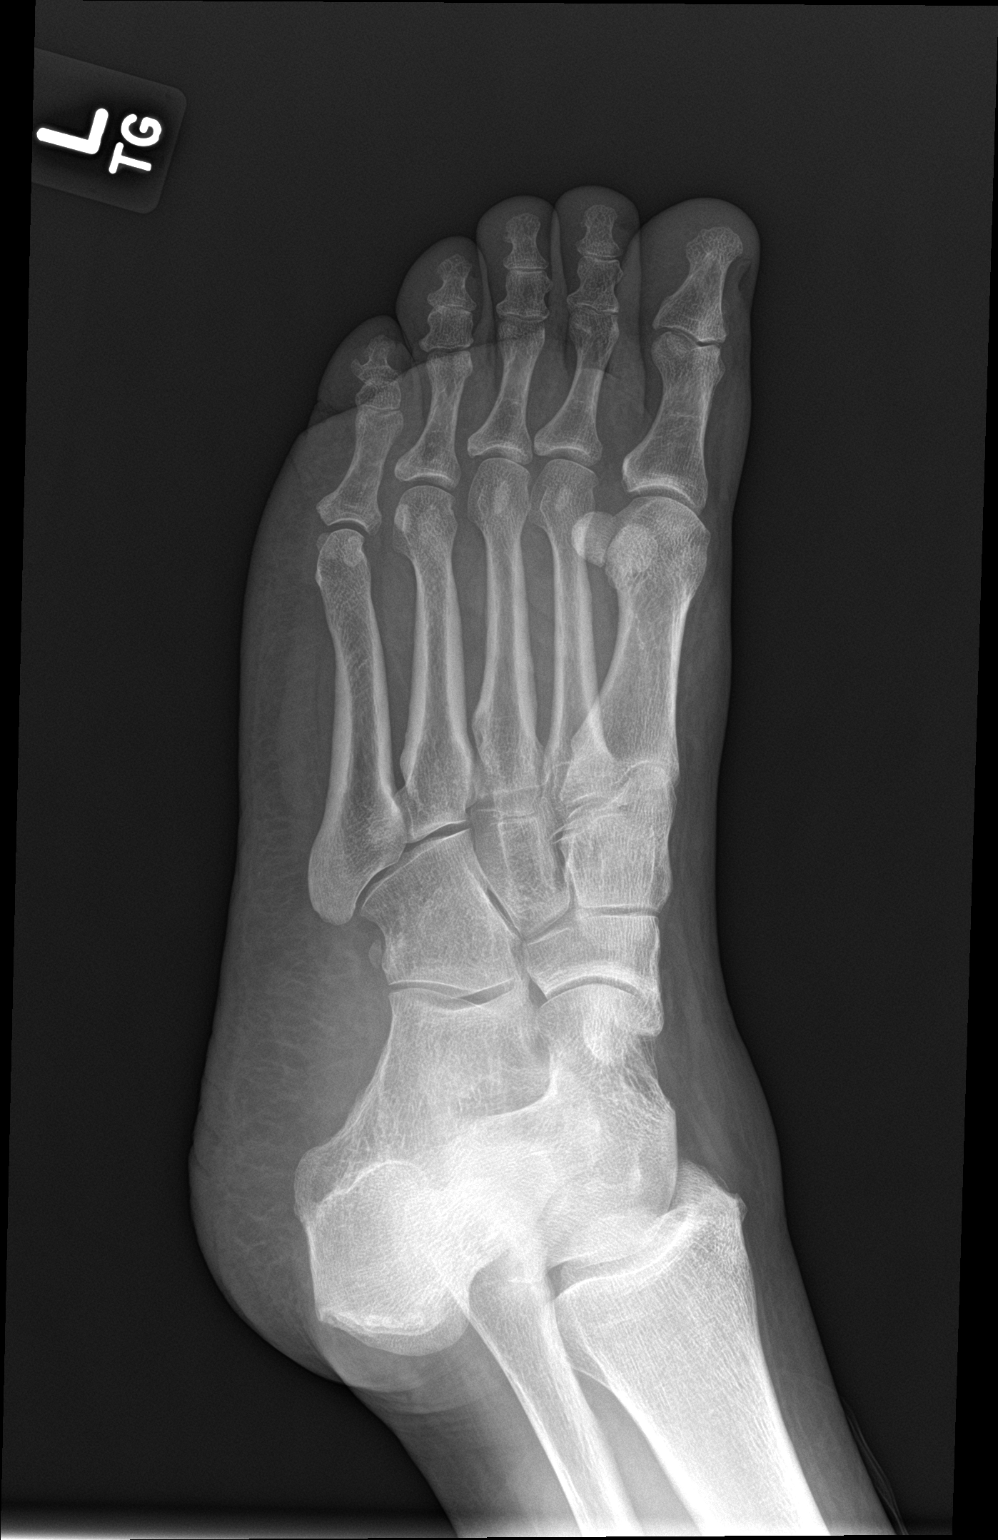

[foot lat]
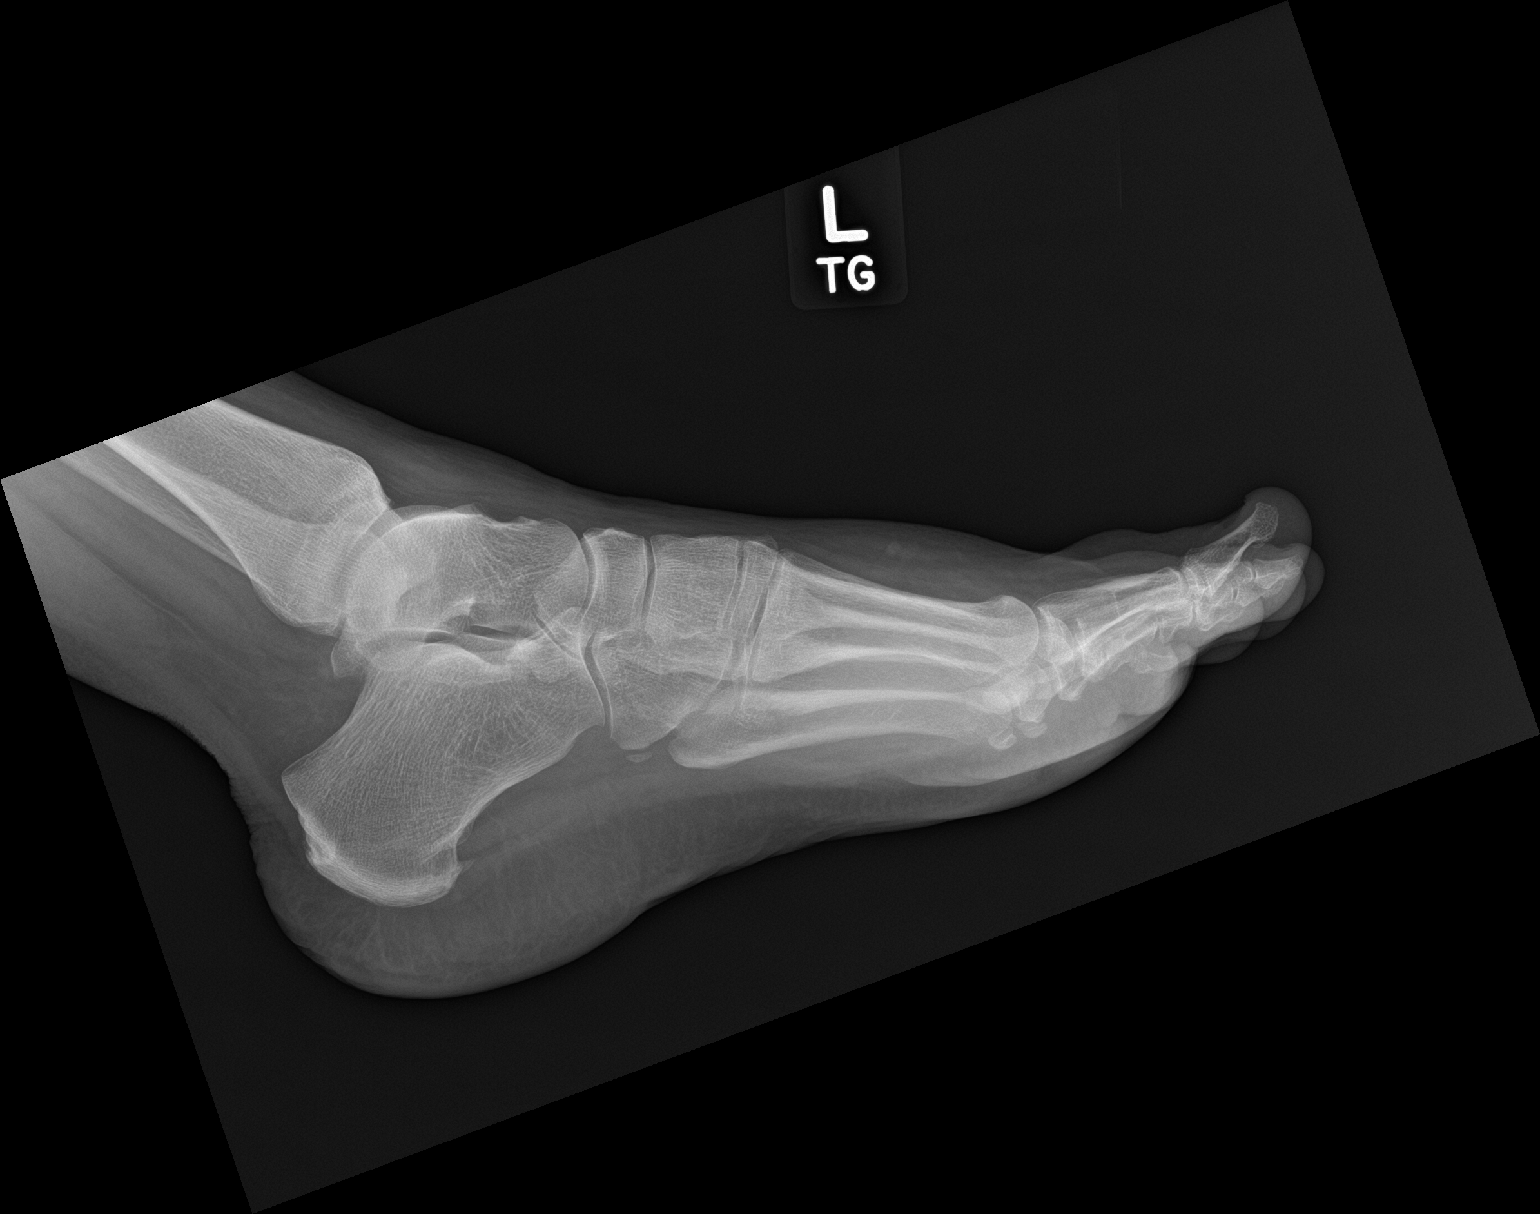

[3 of 3 positions shown; findings below may reference images not displayed]

FINDINGS: Bone mineralization is within normal limits. There is no evidence of
fracture or dislocation. Accessory ossicles adjacent to the cuboid
and navicular. Mild plantar degenerative spurring at the calcaneus.
There is no other evidence of arthropathy or other focal bone
abnormality. Generalized soft tissue swelling suspected in the
distal left foot. No soft tissue gas. No radiopaque foreign body
identified.
IMPRESSION: Soft tissue swelling in the distal left foot. Otherwise negative for
age.

## 2019-09-15 ENCOUNTER — Encounter: Payer: Self-pay | Admitting: Family Medicine

## 2019-09-15 DIAGNOSIS — Z1231 Encounter for screening mammogram for malignant neoplasm of breast: Secondary | ICD-10-CM | POA: Diagnosis not present

## 2019-09-15 LAB — HM MAMMOGRAPHY

## 2019-09-15 LAB — BASIC METABOLIC PANEL
BUN: 15 (ref 4–21)
CO2: 25 — AB (ref 13–22)
Chloride: 107 (ref 99–108)
Creatinine: 0.8 (ref <=1.1)
Glucose: 149
Potassium: 3.7 (ref 3.4–5.3)
Sodium: 141 (ref 137–147)

## 2019-09-15 LAB — COMPREHENSIVE METABOLIC PANEL: Calcium: 9.2 (ref 8.7–10.7)

## 2019-09-17 DIAGNOSIS — Z87891 Personal history of nicotine dependence: Secondary | ICD-10-CM | POA: Diagnosis not present

## 2019-09-17 DIAGNOSIS — R0602 Shortness of breath: Secondary | ICD-10-CM | POA: Diagnosis not present

## 2019-09-17 DIAGNOSIS — R140 Abdominal distension (gaseous): Secondary | ICD-10-CM | POA: Diagnosis not present

## 2019-09-18 DIAGNOSIS — R0602 Shortness of breath: Secondary | ICD-10-CM | POA: Diagnosis not present

## 2019-09-18 DIAGNOSIS — R0789 Other chest pain: Secondary | ICD-10-CM | POA: Diagnosis not present

## 2019-09-19 ENCOUNTER — Ambulatory Visit (INDEPENDENT_AMBULATORY_CARE_PROVIDER_SITE_OTHER): Payer: Medicare HMO | Admitting: Family Medicine

## 2019-09-19 ENCOUNTER — Other Ambulatory Visit: Payer: Self-pay

## 2019-09-19 VITALS — BP 151/80 | HR 93 | Ht 62.0 in | Wt 219.0 lb

## 2019-09-19 DIAGNOSIS — R0789 Other chest pain: Secondary | ICD-10-CM | POA: Diagnosis not present

## 2019-09-19 DIAGNOSIS — R1012 Left upper quadrant pain: Secondary | ICD-10-CM

## 2019-09-19 DIAGNOSIS — I10 Essential (primary) hypertension: Secondary | ICD-10-CM | POA: Diagnosis not present

## 2019-09-19 DIAGNOSIS — K219 Gastro-esophageal reflux disease without esophagitis: Secondary | ICD-10-CM | POA: Diagnosis not present
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
## 2019-09-20 DIAGNOSIS — N289 Disorder of kidney and ureter, unspecified: Secondary | ICD-10-CM | POA: Diagnosis not present

## 2019-09-20 DIAGNOSIS — D1809 Hemangioma of other sites: Secondary | ICD-10-CM | POA: Diagnosis not present

## 2019-09-20 DIAGNOSIS — D1803 Hemangioma of intra-abdominal structures: Secondary | ICD-10-CM | POA: Diagnosis not present

## 2019-09-20 DIAGNOSIS — R1900 Intra-abdominal and pelvic swelling, mass and lump, unspecified site: Secondary | ICD-10-CM | POA: Diagnosis not present

## 2019-09-20 DIAGNOSIS — R1084 Generalized abdominal pain: Secondary | ICD-10-CM | POA: Diagnosis not present

## 2019-09-22 ENCOUNTER — Encounter: Payer: Self-pay | Admitting: Family Medicine

## 2019-09-22 DIAGNOSIS — E876 Hypokalemia: Secondary | ICD-10-CM | POA: Diagnosis not present

## 2019-09-22 LAB — BASIC METABOLIC PANEL
BUN: 12 (ref 4–21)
CO2: 27 — AB (ref 13–22)
Chloride: 109 — AB (ref 99–108)
Creatinine: 0.6 (ref <=1.1)
Glucose: 144
Potassium: 3.5 (ref 3.4–5.3)
Sodium: 141 (ref 137–147)

## 2019-09-22 LAB — COMPREHENSIVE METABOLIC PANEL: Calcium: 8.9 (ref 8.7–10.7)

## 2019-09-22 NOTE — Assessment & Plan Note (Signed)
Recommend trial of tagament liquid.  Ranitidine liquid not available.

## 2019-09-22 NOTE — Assessment & Plan Note (Signed)
Still not well controlled.  Encouraged her to take BP med consistently

## 2019-09-23 ENCOUNTER — Other Ambulatory Visit: Payer: Self-pay | Admitting: Family Medicine

## 2019-09-24 DIAGNOSIS — Z87891 Personal history of nicotine dependence: Secondary | ICD-10-CM | POA: Diagnosis not present

## 2019-09-24 DIAGNOSIS — R0602 Shortness of breath: Secondary | ICD-10-CM | POA: Diagnosis not present

## 2019-09-24 DIAGNOSIS — R05 Cough: Secondary | ICD-10-CM | POA: Diagnosis not present

## 2019-09-24 DIAGNOSIS — F458 Other somatoform disorders: Secondary | ICD-10-CM | POA: Diagnosis not present

## 2019-09-24 DIAGNOSIS — R000 Tachycardia, unspecified: Secondary | ICD-10-CM | POA: Diagnosis not present

## 2019-09-24 DIAGNOSIS — F419 Anxiety disorder, unspecified: Secondary | ICD-10-CM | POA: Diagnosis not present

## 2019-09-24 DIAGNOSIS — J312 Chronic pharyngitis: Secondary | ICD-10-CM | POA: Diagnosis not present

## 2019-09-26 ENCOUNTER — Telehealth (INDEPENDENT_AMBULATORY_CARE_PROVIDER_SITE_OTHER): Payer: Medicare HMO | Admitting: Family Medicine

## 2019-09-26 DIAGNOSIS — M79672 Pain in left foot: Secondary | ICD-10-CM

## 2019-09-26 DIAGNOSIS — K2100 Gastro-esophageal reflux disease with esophagitis, without bleeding: Secondary | ICD-10-CM

## 2019-09-26 DIAGNOSIS — I10 Essential (primary) hypertension: Secondary | ICD-10-CM

## 2019-09-26 DIAGNOSIS — R110 Nausea: Secondary | ICD-10-CM | POA: Diagnosis not present

## 2019-09-26 DIAGNOSIS — R42 Dizziness and giddiness: Secondary | ICD-10-CM | POA: Diagnosis not present

## 2019-09-26 DIAGNOSIS — F419 Anxiety disorder, unspecified: Secondary | ICD-10-CM | POA: Diagnosis not present

## 2019-09-26 DIAGNOSIS — M25473 Effusion, unspecified ankle: Secondary | ICD-10-CM | POA: Diagnosis not present

## 2019-09-26 DIAGNOSIS — D509 Iron deficiency anemia, unspecified: Secondary | ICD-10-CM

## 2019-09-26 DIAGNOSIS — R079 Chest pain, unspecified: Secondary | ICD-10-CM | POA: Diagnosis not present

## 2019-09-26 DIAGNOSIS — R0789 Other chest pain: Secondary | ICD-10-CM | POA: Diagnosis not present

## 2019-09-26 DIAGNOSIS — R309 Painful micturition, unspecified: Secondary | ICD-10-CM

## 2019-09-26 DIAGNOSIS — R0981 Nasal congestion: Secondary | ICD-10-CM | POA: Diagnosis not present

## 2019-09-26 DIAGNOSIS — T454X5A Adverse effect of iron and its compounds, initial encounter: Secondary | ICD-10-CM | POA: Diagnosis not present

## 2019-09-26 DIAGNOSIS — R5381 Other malaise: Secondary | ICD-10-CM | POA: Diagnosis not present

## 2019-09-26 DIAGNOSIS — E611 Iron deficiency: Secondary | ICD-10-CM | POA: Diagnosis not present

## 2019-09-26 MED ORDER — DOXYCYCLINE MONOHYDRATE 50 MG PO CAPS: 50.0000 mg | ORAL_CAPSULE | Freq: Two times a day (BID) | ORAL | 0 refills | Status: DC

## 2019-09-26 NOTE — Progress Notes (Signed)
lvm advising pt I was calling to do her prescreening.Elouise Munroe, Hydro

## 2019-09-26 NOTE — Progress Notes (Signed)
Virtual Visit via Video Note  I connected with Jennifer [name] on 10/01/19 at  2:00 PM EST by a video enabled telemedicine application and verified that I am speaking with the correct person using two identifiers.   I discussed the limitations of evaluation and management by telemedicine and the availability of in person appointments. The patient expressed understanding and agreed to proceed.  Subjective:    CC: Right sided CP.   HPI: Started getting right chest pain and felt like she was going to pass out during her iron infusion. She was given 125 ml Solumedrol and Benadryl 50mg  and then went to the ED to be evaluation.  She is back home now. She now has a HA.   Has had some sinus congestion x 1 week. Thick white mucous.  Using nasonex x 2 days.  Feels really swollen. LNs are still swollen.   HTN - Says her BP was good when she first got to the infusion center, 138/83. She is on her metoprolol.  She hasn't started the hydralazine.    GERD - she picked up the cimetidine but hasn't stated it. She did try the carafate but made her nauseated.    Has been getting some swelling again in her ankle. She has been getting left heel pain with a electrical sensation. She hasn't been taking the spironolactone.   Having pain with urination but no burning.  No rash in groin. Getting an itch patch on  the left side. No abnormal vaginal discharge.   Past medical history, Surgical history, Family history not pertinant except as noted below, Social history, Allergies, and medications have been entered into the medical record, reviewed, and corrections made.   Review of Systems: No fevers, chills, night sweats, weight loss, chest pain, or shortness of breath.   Objective:    General: Speaking clearly in complete sentences without any shortness of breath.  Alert and oriented x3.  Normal judgment. No apparent acute distress.    Impression and Recommendations:   Acute sinusitis - will tx with doxy.  She doesn't tolerate 100mg  so will clal in 50mg  BID.  Call if not better after the weekend.  HTN - BP was controlled earlier today.  It is up again.  Again I would like for her to really bring in a blood pressure log where she writes down her pressure so that we can look at them overall   Ankle swelling - recommend trial of her spironolactone PRN . Though she feels it raises her pressure.  Okay to keep feet elevated.  She would be a good candidate for compression stockings if she continues to get repeat ankle swelling.  Left heel pain - intermittant shart shooting pain that is breif.  Happy to look at it in more detail when she comes in for an office appointment.  Iron deficiency-we will have to discuss with her hematologist whether or not they will proceed with future iron infusions.  This is the first time she has had a negative reaction.  Pain with urination-recommend urinalysis and culture for further work-up.  I discussed the assessment and treatment plan with the patient. The patient was provided an opportunity to ask questions and all were answered. The patient agreed with the plan and demonstrated an understanding of the instructions.   The patient was advised to call back or seek an in-person evaluation if the symptoms worsen or if the condition fails to improve as anticipated.  Time spent in encounter 30 minutes including reviewing  ED notes.  Beatrice Lecher, MD

## 2019-09-29 DIAGNOSIS — R5383 Other fatigue: Secondary | ICD-10-CM | POA: Diagnosis not present

## 2019-09-29 DIAGNOSIS — R000 Tachycardia, unspecified: Secondary | ICD-10-CM | POA: Diagnosis not present

## 2019-09-29 DIAGNOSIS — R0602 Shortness of breath: Secondary | ICD-10-CM | POA: Diagnosis not present

## 2019-09-29 DIAGNOSIS — R519 Headache, unspecified: Secondary | ICD-10-CM | POA: Diagnosis not present

## 2019-09-29 DIAGNOSIS — R079 Chest pain, unspecified: Secondary | ICD-10-CM | POA: Diagnosis not present

## 2019-09-29 DIAGNOSIS — R531 Weakness: Secondary | ICD-10-CM | POA: Diagnosis not present

## 2019-09-29 DIAGNOSIS — H538 Other visual disturbances: Secondary | ICD-10-CM | POA: Diagnosis not present

## 2019-09-30 DIAGNOSIS — E611 Iron deficiency: Secondary | ICD-10-CM | POA: Diagnosis not present

## 2019-09-30 DIAGNOSIS — F419 Anxiety disorder, unspecified: Secondary | ICD-10-CM | POA: Diagnosis not present

## 2019-10-01 ENCOUNTER — Encounter: Payer: Self-pay | Admitting: Family Medicine

## 2019-10-01 DIAGNOSIS — R309 Painful micturition, unspecified: Secondary | ICD-10-CM | POA: Diagnosis not present

## 2019-10-03 ENCOUNTER — Encounter: Payer: Self-pay | Admitting: Family Medicine

## 2019-10-03 ENCOUNTER — Ambulatory Visit (INDEPENDENT_AMBULATORY_CARE_PROVIDER_SITE_OTHER): Payer: Medicare HMO | Admitting: Family Medicine

## 2019-10-03 VITALS — BP 145/87 | HR 107 | Temp 98.9°F | Ht 62.0 in | Wt 219.0 lb

## 2019-10-03 DIAGNOSIS — M79672 Pain in left foot: Secondary | ICD-10-CM | POA: Diagnosis not present

## 2019-10-03 DIAGNOSIS — I10 Essential (primary) hypertension: Secondary | ICD-10-CM

## 2019-10-03 DIAGNOSIS — M79671 Pain in right foot: Secondary | ICD-10-CM | POA: Diagnosis not present

## 2019-10-04 LAB — URINALYSIS W MICROSCOPIC + REFLEX CULTURE
Bacteria, UA: NONE SEEN /HPF
Bilirubin Urine: NEGATIVE
Glucose, UA: NEGATIVE
Hgb urine dipstick: NEGATIVE
Hyaline Cast: NONE SEEN /LPF
Leukocyte Esterase: NEGATIVE
Nitrites, Initial: NEGATIVE
Protein, ur: NEGATIVE
Specific Gravity, Urine: 1.024 (ref 1.001–1.03)
Squamous Epithelial / HPF: NONE SEEN /HPF (ref <=5)
WBC, UA: NONE SEEN /HPF (ref 0–5)
pH: <=5 (ref 5.0–8.0)

## 2019-10-04 LAB — NO CULTURE INDICATED

## 2019-10-08 ENCOUNTER — Emergency Department (HOSPITAL_BASED_OUTPATIENT_CLINIC_OR_DEPARTMENT_OTHER)
Admission: EM | Admit: 2019-10-08 | Discharge: 2019-10-08 | Disposition: A | Discharge status: Home / Self Care | Payer: Medicare HMO | Attending: Emergency Medicine | Admitting: Emergency Medicine

## 2019-10-08 ENCOUNTER — Encounter (HOSPITAL_BASED_OUTPATIENT_CLINIC_OR_DEPARTMENT_OTHER): Payer: Self-pay

## 2019-10-08 ENCOUNTER — Encounter: Payer: Self-pay | Admitting: Family Medicine

## 2019-10-08 ENCOUNTER — Other Ambulatory Visit: Payer: Self-pay

## 2019-10-08 DIAGNOSIS — R05 Cough: Secondary | ICD-10-CM | POA: Diagnosis present

## 2019-10-08 DIAGNOSIS — R42 Dizziness and giddiness: Secondary | ICD-10-CM | POA: Diagnosis not present

## 2019-10-08 DIAGNOSIS — Z20822 Contact with and (suspected) exposure to covid-19: Secondary | ICD-10-CM | POA: Insufficient documentation

## 2019-10-08 DIAGNOSIS — H538 Other visual disturbances: Secondary | ICD-10-CM | POA: Diagnosis not present

## 2019-10-08 DIAGNOSIS — R519 Headache, unspecified: Secondary | ICD-10-CM | POA: Diagnosis not present

## 2019-10-08 DIAGNOSIS — J069 Acute upper respiratory infection, unspecified: Secondary | ICD-10-CM | POA: Insufficient documentation

## 2019-10-08 DIAGNOSIS — Z79899 Other long term (current) drug therapy: Secondary | ICD-10-CM | POA: Diagnosis not present

## 2019-10-08 DIAGNOSIS — R21 Rash and other nonspecific skin eruption: Secondary | ICD-10-CM | POA: Diagnosis not present

## 2019-10-08 DIAGNOSIS — Z87891 Personal history of nicotine dependence: Secondary | ICD-10-CM | POA: Diagnosis not present

## 2019-10-08 DIAGNOSIS — H9202 Otalgia, left ear: Secondary | ICD-10-CM | POA: Diagnosis not present

## 2019-10-08 DIAGNOSIS — E876 Hypokalemia: Secondary | ICD-10-CM | POA: Diagnosis not present

## 2019-10-08 DIAGNOSIS — B9789 Other viral agents as the cause of diseases classified elsewhere: Secondary | ICD-10-CM | POA: Diagnosis not present

## 2019-10-08 LAB — SARS CORONAVIRUS 2 AG (30 MIN TAT): SARS Coronavirus 2 Ag: NEGATIVE

## 2019-10-08 LAB — BASIC METABOLIC PANEL
BUN: 12 (ref 4–21)
CO2: 25 — AB (ref 13–22)
Chloride: 109 — AB (ref 99–108)
Creatinine: 0.6 (ref <=1.1)
Glucose: 212
Potassium: 3.7 (ref 3.4–5.3)
Sodium: 140 (ref 137–147)

## 2019-10-08 LAB — COMPREHENSIVE METABOLIC PANEL
Calcium: 8.7 (ref 8.7–10.7)
GFR calc non Af Amer: >=90

## 2019-10-08 MED ORDER — ONDANSETRON 4 MG PO TBDP: 4.0000 mg | ORAL_TABLET | Freq: Three times a day (TID) | ORAL | 0 refills | Status: DC | PRN

## 2019-10-08 NOTE — Discharge Instructions (Addendum)
Please follow up with your PCP regarding today's encounter.  Please take Mucinex should you develop any worsening cough in addition to your congestion symptoms.  Recommend nasal rinses, fluticasone, and antihistamines for your sinus congestion.  You may wish to consider ENT referral for your reported chronic sinus trouble.  Your primary care provider should be able to arrange a referral.  Please return to the ED or seek immediate medical attention for any new or worsening symptoms.

## 2019-10-08 NOTE — Progress Notes (Signed)
Virtual Visit via Video Note  I connected with Jennifer [name] on 10/08/19 at  2:20 PM EST by a video enabled telemedicine application and verified that I am speaking with the correct person using two identifiers.   I discussed the limitations of evaluation and management by telemedicine and the availability of in person appointments. The patient expressed understanding and agreed to proceed.  Subjective:    CC:   HPI: Went to the ED 4 days ago for tachycardia, headache and fatigue.    C/O of painful cough. Also getting painint he RUQ pain radiating into her back.  Says notices it more with walking.  No fever or chills.  Still feels very tired.    Reports has an injury to her back years ago while lifting a paitent.    Still having pain radiating into her right axilla. Still feels swollen at time.   Reports her feet are bothering her again.  Previously dx with plantar fascitis. It eventually got better but now  Feels like needles sticking in her feet again Painful to walk. Has received injections previously. Also c/o of pain over the posterior medial Left heed.      Past medical history, Surgical history, Family history not pertinant except as noted below, Social history, Allergies, and medications have been entered into the medical record, reviewed, and corrections made.   Review of Systems: No fevers, chills, night sweats, weight loss, chest pain, or shortness of breath.   Objective:    General: Speaking clearly in complete sentences without any shortness of breath.  Alert and oriented x3.  Normal judgment. No apparent acute distress.    Impression and Recommendations:    No problem-specific Assessment & Plan notes found for this encounter.  Bilateral foot pain - like plantar fasciitis is back. Consider getting back oin with sports med if not improving with conservative tx. Avoid going barefoot.  Back pain - would benefit from Physical Therapy  Right axilla pain - has been  worked up previously with [place]    RUQ pain - bowels are moving well. She does have constipation. Pain is chronic and recurrent.  Consider medication for pain control. Has seen GI.       Time spent in encounter 35 minutes including reviewing ED notes.   I discussed the assessment and treatment plan with the patient. The patient was provided an opportunity to ask questions and all were answered. The patient agreed with the plan and demonstrated an understanding of the instructions.   The patient was advised to call back or seek an in-person evaluation if the symptoms worsen or if the condition fails to improve as anticipated. Recently examined the are in the office. No worrisome lymph nodes.       Beatrice Lecher, MD

## 2019-10-08 NOTE — ED Triage Notes (Addendum)
Pt c/o cough, SOB, n/v x today-NAD-steady gait
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
## 2019-10-09 DIAGNOSIS — M7989 Other specified soft tissue disorders: Secondary | ICD-10-CM | POA: Diagnosis not present

## 2019-10-09 DIAGNOSIS — Z87891 Personal history of nicotine dependence: Secondary | ICD-10-CM | POA: Diagnosis not present

## 2019-10-09 DIAGNOSIS — M79662 Pain in left lower leg: Secondary | ICD-10-CM | POA: Diagnosis not present

## 2019-10-09 LAB — SARS CORONAVIRUS 2 (TAT 6-24 HRS): SARS Coronavirus 2: NEGATIVE

## 2019-10-10 ENCOUNTER — Telehealth (INDEPENDENT_AMBULATORY_CARE_PROVIDER_SITE_OTHER): Payer: Medicare HMO | Admitting: Family Medicine

## 2019-10-10 DIAGNOSIS — E876 Hypokalemia: Secondary | ICD-10-CM | POA: Diagnosis not present

## 2019-10-10 DIAGNOSIS — J0191 Acute recurrent sinusitis, unspecified: Secondary | ICD-10-CM | POA: Diagnosis not present

## 2019-10-10 DIAGNOSIS — R002 Palpitations: Secondary | ICD-10-CM | POA: Diagnosis not present

## 2019-10-10 DIAGNOSIS — I471 Supraventricular tachycardia: Secondary | ICD-10-CM | POA: Diagnosis not present

## 2019-10-10 DIAGNOSIS — R7301 Impaired fasting glucose: Secondary | ICD-10-CM | POA: Diagnosis not present

## 2019-10-10 DIAGNOSIS — R1110 Vomiting, unspecified: Secondary | ICD-10-CM

## 2019-10-10 DIAGNOSIS — K219 Gastro-esophageal reflux disease without esophagitis: Secondary | ICD-10-CM

## 2019-10-10 DIAGNOSIS — F410 Panic disorder [episodic paroxysmal anxiety] without agoraphobia: Secondary | ICD-10-CM | POA: Diagnosis not present

## 2019-10-10 DIAGNOSIS — R079 Chest pain, unspecified: Secondary | ICD-10-CM | POA: Diagnosis not present

## 2019-10-10 MED ORDER — CLINDAMYCIN HCL 150 MG PO CAPS: 150.0000 mg | ORAL_CAPSULE | Freq: Three times a day (TID) | ORAL | 0 refills | Status: DC

## 2019-10-10 MED ORDER — CLINDAMYCIN HCL 75 MG PO CAPS: 75.0000 mg | ORAL_CAPSULE | Freq: Four times a day (QID) | ORAL | 0 refills | Status: DC

## 2019-10-10 NOTE — Assessment & Plan Note (Addendum)
Taking reflux medicine. I think the vomiting has really irritated her throat. Strongly recommend start back on her tagamet.

## 2019-10-10 NOTE — Assessment & Plan Note (Signed)
Not improved with doxy.  Continue with nasal steroid spray and nasal saline spray.

## 2019-10-10 NOTE — Assessment & Plan Note (Signed)
Can update A1C when back.  A1C in lower 6 range.

## 2019-10-10 NOTE — Progress Notes (Signed)
Virtual Visit via Video Note  I connected with NAYANI PINNEO on 10/10/19 at  4:20 PM EST by a video enabled telemedicine application and verified that I am speaking with the correct person using two identifiers.   I discussed the limitations of evaluation and management by telemedicine and the availability of in person appointments. The patient expressed understanding and agreed to proceed.  Subjective:    CC: left pain/hip pain  HPI:  Pt reports that she has been having L leg pain that starts at the hip and goes down. Starts in the groin crease and goes down and also radiating posteriorly from her low back radiating down posteriorly.  She feels like a millon pins are sticking her. Same side as she has been having some heel pain. Leg feels a little swollen as well. Says hard to raise her left leg.    Saw her for sinusitis on 09/26/2019 and started low dose of doxy which she finished on 10/06/2019. She's been experiencing chills(chilled to the bone), feeling nauseated, back pain. She c/o feeling really tired. Went to the ED on 10/08/2019 for URI. Last time vomited about about 2 days ago. Her ear has been hurting as well. Using nasal rinse and nasal steroid spray.    She was told at the ed that she possibly has diabetic neuropathy. They suggested gabapentin she stated that she did not fill this. She is concerned bc she doesn't have diabetes but does have pre-diabetes.    Today she said that she woke up and felt like she couldn't breathe and her heart was racing. Says she felt out of it as well so went to the ED.  She said it hasn't ever felt like this before. Believes that this is associated with her sinuses.   She stated that her Potassium dropped a couple of points from her normal levels and felt that this contributed to why she feels so bad.   Past medical history, Surgical history, Family history not pertinant except as noted below, Social history, Allergies, and medications have been  entered into the medical record, reviewed, and corrections made.   Review of Systems: No fevers, chills, night sweats, weight loss, chest pain, or shortness of breath.   Objective:    General: Speaking clearly in complete sentences without any shortness of breath.  Alert and oriented x3.  Normal judgment. No apparent acute distress.    Impression and Recommendations:    Acute recurrent sinusitis Not improved with doxy.  Continue with nasal steroid spray and nasal saline spray.   IFG (impaired fasting glucose) Can update A1C when back.  A1C in lower 6 range.    Gastroesophageal reflux disease without esophagitis Taking reflux medicine. I think the vomiting has really irritated her throat. Strongly recommend start back on her tagamet.   SVT (supraventricular tachycardia) ([place]) Make sure hydrating well.  Went to ED for sxs but she is feeling better now.    Vomiting - seems to have resolved.      Time spent in encounter 40 minutes  I discussed the assessment and treatment plan with the patient. The patient was provided an opportunity to ask questions and all were answered. The patient agreed with the plan and demonstrated an understanding of the instructions.   The patient was advised to call back or seek an in-person evaluation if the symptoms worsen or if the condition fails to improve as anticipated.   Beatrice Lecher, MD

## 2019-10-10 NOTE — Assessment & Plan Note (Signed)
Make sure hydrating well.  Went to ED for sxs but she is feeling better now.

## 2019-10-10 NOTE — Progress Notes (Signed)
Pt reports that she has been having L leg pain that starts at the hip and goes down. She feels like a millon pins are sticking her.  She's been experiencing chills(chilled to the bone), feeling nauseated,back pain.  She c/o feeling really tired.   She was told at the ed that she possibly has diabetic neuropathy. They suggested gabapentin she stated that she did not fill this.  She said that she felt like she couldn't breathe like her heart was racing. She said it hasn't ever felt like this before. Believes that this is associated with her sinuses.   She stated that her Potassium dropped a couple of points from her normal levels and felt that this contributed to why she feels so bad.

## 2019-10-13 DIAGNOSIS — R519 Headache, unspecified: Secondary | ICD-10-CM | POA: Diagnosis not present

## 2019-10-13 DIAGNOSIS — R002 Palpitations: Secondary | ICD-10-CM | POA: Diagnosis not present

## 2019-10-13 DIAGNOSIS — I491 Atrial premature depolarization: Secondary | ICD-10-CM | POA: Diagnosis not present

## 2019-10-14 ENCOUNTER — Other Ambulatory Visit: Payer: Self-pay

## 2019-10-14 ENCOUNTER — Ambulatory Visit (INDEPENDENT_AMBULATORY_CARE_PROVIDER_SITE_OTHER): Payer: Medicare HMO | Admitting: Family Medicine

## 2019-10-14 ENCOUNTER — Encounter: Payer: Self-pay | Admitting: Family Medicine

## 2019-10-14 VITALS — BP 141/75 | HR 83 | Ht 62.0 in | Wt 219.0 lb

## 2019-10-14 DIAGNOSIS — M545 Low back pain, unspecified: Secondary | ICD-10-CM

## 2019-10-14 DIAGNOSIS — R7301 Impaired fasting glucose: Secondary | ICD-10-CM

## 2019-10-14 DIAGNOSIS — R002 Palpitations: Secondary | ICD-10-CM | POA: Diagnosis not present

## 2019-10-14 DIAGNOSIS — M7062 Trochanteric bursitis, left hip: Secondary | ICD-10-CM

## 2019-10-14 DIAGNOSIS — M76892 Other specified enthesopathies of left lower limb, excluding foot: Secondary | ICD-10-CM | POA: Diagnosis not present

## 2019-10-14 DIAGNOSIS — I493 Ventricular premature depolarization: Secondary | ICD-10-CM

## 2019-10-14 DIAGNOSIS — M549 Dorsalgia, unspecified: Secondary | ICD-10-CM

## 2019-10-14 DIAGNOSIS — I10 Essential (primary) hypertension: Secondary | ICD-10-CM | POA: Diagnosis not present

## 2019-10-14 DIAGNOSIS — R350 Frequency of micturition: Secondary | ICD-10-CM | POA: Diagnosis not present

## 2019-10-14 LAB — POCT URINALYSIS DIPSTICK
Bilirubin, UA: NEGATIVE
Blood, UA: NEGATIVE
Glucose, UA: NEGATIVE
Ketones, UA: NEGATIVE
Leukocytes, UA: NEGATIVE
Nitrite, UA: NEGATIVE
Protein, UA: NEGATIVE
Spec Grav, UA: >=1.03 — AB (ref 1.010–1.025)
Urobilinogen, UA: 0.2 E.U./dL
pH, UA: 5.5 (ref 5.0–8.0)

## 2019-10-14 LAB — POCT GLYCOSYLATED HEMOGLOBIN (HGB A1C): Hemoglobin A1C: 5.9 % — AB (ref 4.0–5.6)

## 2019-10-14 MED ORDER — METOPROLOL TARTRATE 25 MG PO TABS: 25.0000 mg | ORAL_TABLET | Freq: Three times a day (TID) | ORAL | 3 refills | Status: DC
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
## 2019-10-14 NOTE — Assessment & Plan Note (Signed)
A1C looks great today and is better than last time.  Great work!!!!

## 2019-10-14 NOTE — Assessment & Plan Note (Signed)
Pressure just mildly elevated today.  We discussed going back to taking the metoprolol 3 times a day which we did do for short period of time.  I think this will help better control the PVCs/PACs that she is currently experiencing as well as better control her blood pressure.  I did go ahead and just update her prescription and sent a new one to the pharmacy.  We had tried increasing her to 50 mg twice a day on the metoprolol in the past and she did not feel as well on it.

## 2019-10-14 NOTE — Assessment & Plan Note (Signed)
She did have an EKG yesterday the emergency department that showed a PVC and she was having some sensation of heart fluttering and tachycardia.  Today EKG shows normal sinus rhythm with no acute ST-T wave changes.  Rate of 74 bpm.  No previous change from prior.

## 2019-10-15 ENCOUNTER — Other Ambulatory Visit: Payer: Self-pay | Admitting: Family Medicine

## 2019-10-15 NOTE — Addendum Note (Signed)
Addended by: Towana Badger on: 10/15/2019 08:59 AM   Modules accepted: Orders

## 2019-10-16 ENCOUNTER — Other Ambulatory Visit: Payer: Self-pay | Admitting: *Deleted

## 2019-10-16 ENCOUNTER — Other Ambulatory Visit: Payer: Self-pay

## 2019-10-16 ENCOUNTER — Emergency Department (HOSPITAL_BASED_OUTPATIENT_CLINIC_OR_DEPARTMENT_OTHER): Payer: Medicare HMO

## 2019-10-16 ENCOUNTER — Emergency Department (HOSPITAL_BASED_OUTPATIENT_CLINIC_OR_DEPARTMENT_OTHER)
Admission: EM | Admit: 2019-10-16 | Discharge: 2019-10-16 | Disposition: A | Discharge status: Home / Self Care | Payer: Medicare HMO | Attending: Emergency Medicine | Admitting: Emergency Medicine

## 2019-10-16 ENCOUNTER — Encounter (HOSPITAL_BASED_OUTPATIENT_CLINIC_OR_DEPARTMENT_OTHER): Payer: Self-pay

## 2019-10-16 DIAGNOSIS — M545 Low back pain, unspecified: Secondary | ICD-10-CM

## 2019-10-16 DIAGNOSIS — R600 Localized edema: Secondary | ICD-10-CM | POA: Diagnosis not present

## 2019-10-16 DIAGNOSIS — Z79899 Other long term (current) drug therapy: Secondary | ICD-10-CM | POA: Insufficient documentation

## 2019-10-16 DIAGNOSIS — G8929 Other chronic pain: Secondary | ICD-10-CM | POA: Diagnosis not present

## 2019-10-16 DIAGNOSIS — J45909 Unspecified asthma, uncomplicated: Secondary | ICD-10-CM | POA: Insufficient documentation

## 2019-10-16 DIAGNOSIS — M79605 Pain in left leg: Secondary | ICD-10-CM | POA: Diagnosis not present

## 2019-10-16 DIAGNOSIS — I10 Essential (primary) hypertension: Secondary | ICD-10-CM | POA: Insufficient documentation

## 2019-10-16 DIAGNOSIS — G35 Multiple sclerosis: Secondary | ICD-10-CM | POA: Insufficient documentation

## 2019-10-16 DIAGNOSIS — Z87891 Personal history of nicotine dependence: Secondary | ICD-10-CM | POA: Diagnosis not present

## 2019-10-16 MED ORDER — METOPROLOL TARTRATE 25 MG PO TABS: 25.0000 mg | ORAL_TABLET | Freq: Three times a day (TID) | ORAL | 3 refills | Status: DC

## 2019-10-16 NOTE — ED Triage Notes (Signed)
Pt states left leg pain, into groin, feels "tight"  Saw pmd for same, no dx.  Ambulates with pain/tightness sensation.  Taking ibuprofen, tylenol, heating pad, last taken 3am
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
## 2019-10-17 ENCOUNTER — Encounter: Payer: Self-pay | Admitting: Family Medicine

## 2019-10-17 ENCOUNTER — Telehealth (INDEPENDENT_AMBULATORY_CARE_PROVIDER_SITE_OTHER): Payer: Medicare HMO | Admitting: Family Medicine

## 2019-10-17 DIAGNOSIS — R7981 Abnormal blood-gas level: Secondary | ICD-10-CM | POA: Insufficient documentation

## 2019-10-17 DIAGNOSIS — M7062 Trochanteric bursitis, left hip: Secondary | ICD-10-CM | POA: Diagnosis not present

## 2019-10-17 DIAGNOSIS — M549 Dorsalgia, unspecified: Secondary | ICD-10-CM | POA: Diagnosis not present

## 2019-10-17 DIAGNOSIS — I10 Essential (primary) hypertension: Secondary | ICD-10-CM

## 2019-10-17 MED ORDER — CLONIDINE 0.1 MG/24HR TD PTWK: 0.1000 mg | MEDICATED_PATCH | TRANSDERMAL | 1 refills | Status: DC

## 2019-10-17 NOTE — Progress Notes (Signed)
Virtual Visit via Video Note  I connected with Jennifer [name] on 10/21/19 at  2:20 PM EST by a video enabled telemedicine application and verified that I am speaking with the correct person using two identifiers.   I discussed the limitations of evaluation and management by telemedicine and the availability of in person appointments. The patient expressed understanding and agreed to proceed.  Subjective:    CC: Went to ED yesterday.   HPI: Pt reports that she had a bad night.  She asked about what does an elevated CO2 mean? she wonders I could be from wearing a mask.   She told me that she has had some SOB,and her legs and back have continued to bother her. Yesterday she noticed that her face was flushed and there was a rash that came up. This has since gone away. not taking any ASA or NSAIDs. Wasn't able to check her BP when it happened.   She was seen at [facility] yesterday for left leg pain.. She stated that her pain has been intolerable.  In fact I had seen her for this earlier in the week and had recommended formal physical therapy.  Patient wanted to do home PT and we had placed orders for this on 3 /2.  She reports that she had tried some ibuprofen and Tylenol as well as a heating pad without significant relief.  She also felt like her lower abdomen was more swollen than usual but denied any nausea vomiting or diarrhea.  Because she also felt like she was having some thigh swelling they did a Doppler to rule out lower extremity DVT which was negative.  Urged her to follow-up with PCP and move forward with physical therapy.  She is actually been at the emergency department 9 times in the last month.  Has been having more HA, becoming more frequent.  BPs have been running in the 140s at home.  Still having occ spikes.    Still taking some doxycycline for her sinuses.  Elevated CO2 level-discussed that this can come from hemolysis of the blood.  Can also become from worsening  sleep apnea as she does have a history of mild sleep apnea.  But at this point is not anything that I think is worrisome or requires additional work-up.    Past medical history, Surgical history, Family history not pertinant except as noted below, Social history, Allergies, and medications have been entered into the medical record, reviewed, and corrections made.   Review of Systems: No fevers, chills, night sweats, weight loss, chest pain, or shortness of breath.   Objective:    General: Speaking clearly in complete sentences without any shortness of breath.  Alert and oriented x3.  Normal judgment. No apparent acute distress.    Impression and Recommendations:    Hypertension with intolerance to multiple antihypertensive drugs She is willing to start clonidine patch. She is concerned it has inc her BP in the past.    Elevated CO2 level Will schedule schedule spirometry for next week. She has also c/o of SOB>  Aslo has mild Sleep apnea.  Check TSH at next OV as well.   For her left hip and back pain-we will work on getting home health to come out.  We have tried to arrange this initially but we could not get the timing to work out.  We will place a new referral and get this sent out.    Time spent in encounter 40 minutes  I discussed  the assessment and treatment plan with the patient. The patient was provided an opportunity to ask questions and all were answered. The patient agreed with the plan and demonstrated an understanding of the instructions.   The patient was advised to call back or seek an in-person evaluation if the symptoms worsen or if the condition fails to improve as anticipated.   Beatrice Lecher, MD

## 2019-10-17 NOTE — Assessment & Plan Note (Addendum)
She is willing to start clonidine patch. She is concerned it has inc her BP in the past.

## 2019-10-17 NOTE — Progress Notes (Signed)
Pt reports that she had a bad night.  She asked about what does an elevated CO2 mean?   She told me that she has had some SOB,and her legs and back have continued to bother her. Yesterday she noticed that her face was flushed and there was a rash that came up. This has since gone away.   She was seen at [facility] yesterday. She stated that her pain has been intolerable.

## 2019-10-17 NOTE — Assessment & Plan Note (Signed)
Will schedule schedule spirometry for next week. She has also c/o of SOB>  Aslo has mild Sleep apnea.  Check TSH at next OV as well.

## 2019-10-20 ENCOUNTER — Telehealth: Payer: Self-pay

## 2019-10-20 NOTE — Telephone Encounter (Signed)
Jennifer [name] with [facility] called and states the first day they can go out to [facility] is Friday.

## 2019-10-21 NOTE — Telephone Encounter (Signed)
That is fine.  Just please put patient know.  All the home health companies are little spread then right now because of Covid so I think if they can get her by Friday then that is fantastic.

## 2019-10-21 NOTE — Telephone Encounter (Signed)
Andretta advised

## 2019-10-22 DIAGNOSIS — R002 Palpitations: Secondary | ICD-10-CM | POA: Diagnosis not present

## 2019-10-22 DIAGNOSIS — R2689 Other abnormalities of gait and mobility: Secondary | ICD-10-CM | POA: Diagnosis not present

## 2019-10-22 DIAGNOSIS — Z8542 Personal history of malignant neoplasm of other parts of uterus: Secondary | ICD-10-CM | POA: Diagnosis not present

## 2019-10-22 DIAGNOSIS — R0602 Shortness of breath: Secondary | ICD-10-CM | POA: Diagnosis not present

## 2019-10-22 DIAGNOSIS — I10 Essential (primary) hypertension: Secondary | ICD-10-CM | POA: Diagnosis not present

## 2019-10-22 DIAGNOSIS — E611 Iron deficiency: Secondary | ICD-10-CM | POA: Diagnosis not present

## 2019-10-22 DIAGNOSIS — F419 Anxiety disorder, unspecified: Secondary | ICD-10-CM | POA: Diagnosis not present

## 2019-10-22 DIAGNOSIS — Z8249 Family history of ischemic heart disease and other diseases of the circulatory system: Secondary | ICD-10-CM | POA: Diagnosis not present

## 2019-10-22 DIAGNOSIS — E876 Hypokalemia: Secondary | ICD-10-CM | POA: Diagnosis not present

## 2019-10-22 LAB — BASIC METABOLIC PANEL
BUN: 12 (ref 4–21)
CO2: 26 — AB (ref 13–22)
Chloride: 107 (ref 99–108)
Creatinine: 0.6 (ref 0.5–1.1)
Glucose: 151
Potassium: 3.7 (ref 3.4–5.3)
Sodium: 141 (ref 137–147)

## 2019-10-22 LAB — COMPREHENSIVE METABOLIC PANEL: Calcium: 9.2 (ref 8.7–10.7)

## 2019-10-23 ENCOUNTER — Encounter: Payer: Self-pay | Admitting: Family Medicine

## 2019-10-24 ENCOUNTER — Other Ambulatory Visit: Payer: Self-pay

## 2019-10-24 ENCOUNTER — Ambulatory Visit (INDEPENDENT_AMBULATORY_CARE_PROVIDER_SITE_OTHER): Payer: Medicare HMO | Admitting: Family Medicine

## 2019-10-24 ENCOUNTER — Telehealth: Payer: Self-pay | Admitting: Family Medicine

## 2019-10-24 ENCOUNTER — Encounter: Payer: Self-pay | Admitting: Family Medicine

## 2019-10-24 VITALS — BP 145/85 | HR 103 | Temp 98.2°F

## 2019-10-24 DIAGNOSIS — J301 Allergic rhinitis due to pollen: Secondary | ICD-10-CM | POA: Diagnosis not present

## 2019-10-24 DIAGNOSIS — R0602 Shortness of breath: Secondary | ICD-10-CM | POA: Diagnosis not present

## 2019-10-24 DIAGNOSIS — I10 Essential (primary) hypertension: Secondary | ICD-10-CM | POA: Diagnosis not present

## 2019-10-24 DIAGNOSIS — J0191 Acute recurrent sinusitis, unspecified: Secondary | ICD-10-CM | POA: Diagnosis not present

## 2019-10-24 IMAGING — DX DG CHEST 2V
2 series · 2 of 2 positions shown · non-contrast
Comparison: 07/17/2018

CLINICAL DATA: Shortness of breath and persistent cough

EXAM:
CHEST - 2 VIEW

[chest pa]
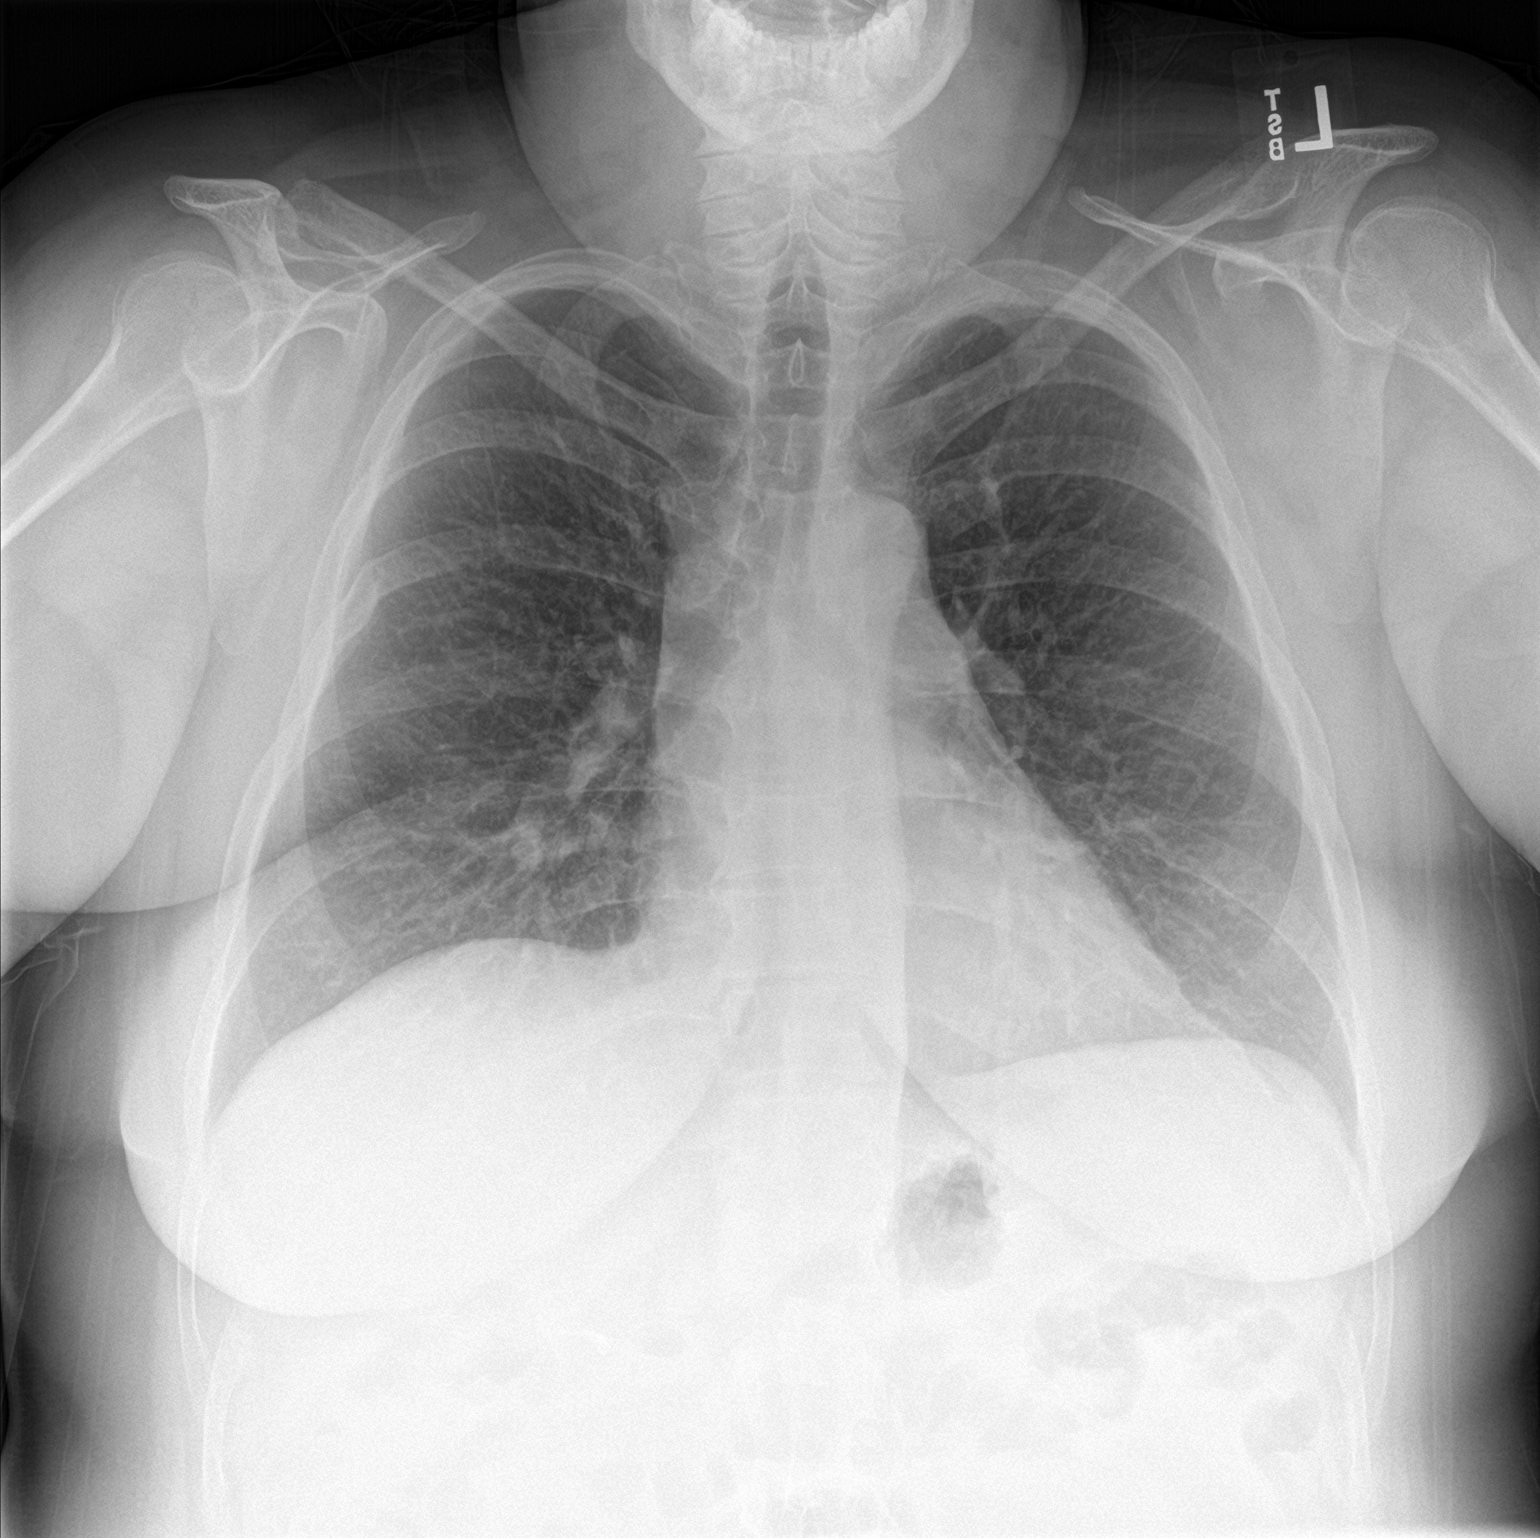

[chest lat]
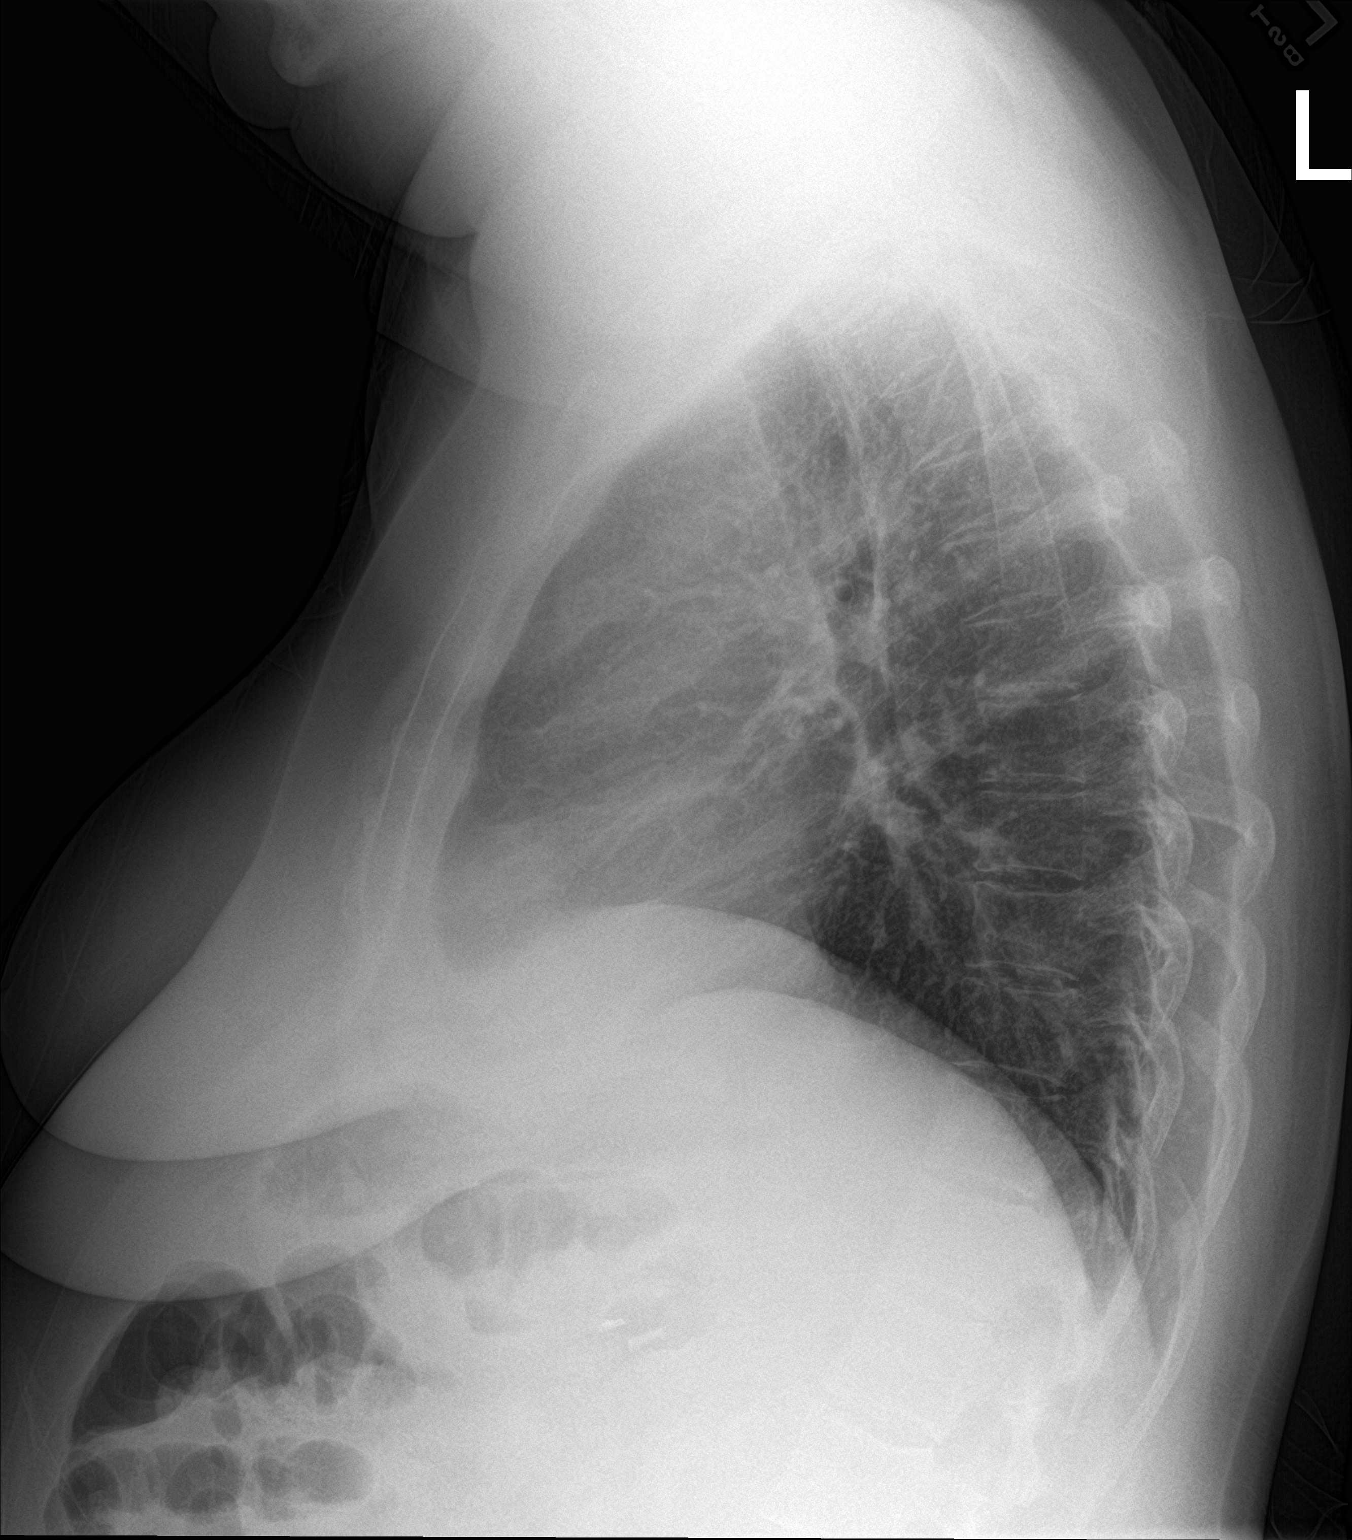

[2 of 2 positions shown; findings below may reference images not displayed]

FINDINGS: The heart size and mediastinal contours are within normal limits.
Both lungs are clear. The visualized skeletal structures show old
rib fractures on the right with healing.
IMPRESSION: No active cardiopulmonary disease.

## 2019-10-24 MED ORDER — QNASL 80 MCG/ACT NA AERS: 1.0000 | INHALATION_SPRAY | Freq: Every day | NASAL | 99 refills | Status: DC

## 2019-10-24 MED ORDER — LEVALBUTEROL TARTRATE 45 MCG/ACT IN AERO
1.0000 | INHALATION_SPRAY | Freq: Once | RESPIRATORY_TRACT | Status: AC
  Administered 2019-10-24: 1 via RESPIRATORY_TRACT

## 2019-10-24 NOTE — Progress Notes (Signed)
Pt had [facility] for spirometry testing. Pt declined to use albuterol sulfate inhaler for treatment. Pt did one puff of personal Xopenex inhaler. Pt had coughing fits throughout spirometry testing.

## 2019-10-24 NOTE — Assessment & Plan Note (Signed)
Did let me know that she had a consultation with a cardiovascular specialist.  They can I do a further evaluation of her kidneys to see if this could be causing some of her blood pressure issues.

## 2019-10-24 NOTE — Assessment & Plan Note (Signed)
Shortness of breath-spirometry shows FVC of 80% with FEV1 of 89% and a ratio of 87.  No significant improvement postbronchodilator.  Essentially normal spirometry.  Reviewed the results with her today.  She does feel like she gets some occasional relief with an inhaler so I did go ahead and refill that today.  I do think some of her shortness of breath is secondary to deconditioning and obesity.  And we discussed that today as well.
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
## 2019-10-24 NOTE — Telephone Encounter (Signed)
Received fax for PA on Triamcinolone Eucerin Cream sent through cover my meds waiting on determination. - CF

## 2019-10-27 ENCOUNTER — Telehealth: Payer: Self-pay

## 2019-10-27 DIAGNOSIS — M545 Low back pain: Secondary | ICD-10-CM | POA: Diagnosis not present

## 2019-10-27 DIAGNOSIS — G8929 Other chronic pain: Secondary | ICD-10-CM | POA: Diagnosis not present

## 2019-10-27 DIAGNOSIS — Y999 Unspecified external cause status: Secondary | ICD-10-CM | POA: Diagnosis not present

## 2019-10-27 DIAGNOSIS — E876 Hypokalemia: Secondary | ICD-10-CM | POA: Diagnosis not present

## 2019-10-27 DIAGNOSIS — W01198A Fall on same level from slipping, tripping and stumbling with subsequent striking against other object, initial encounter: Secondary | ICD-10-CM | POA: Diagnosis not present

## 2019-10-27 DIAGNOSIS — I10 Essential (primary) hypertension: Secondary | ICD-10-CM | POA: Diagnosis not present

## 2019-10-27 DIAGNOSIS — M79605 Pain in left leg: Secondary | ICD-10-CM | POA: Diagnosis not present

## 2019-10-27 LAB — BASIC METABOLIC PANEL
BUN: 11 (ref 4–21)
CO2: 24 — AB (ref 13–22)
Chloride: 108 (ref 99–108)
Creatinine: 0.6 (ref 0.5–1.1)
Glucose: 118
Potassium: 3.7 (ref 3.4–5.3)
Sodium: 140 (ref 137–147)

## 2019-10-27 LAB — COMPREHENSIVE METABOLIC PANEL
Calcium: 8.9 (ref 8.7–10.7)
GFR calc non Af Amer: >=90

## 2019-10-27 NOTE — Telephone Encounter (Signed)
She needs to have tried and failed physical therapy first.  I know we were working on getting someone to come to her house.  If that is not working out we can always get her a referral closer to her local area

## 2019-10-27 NOTE — Telephone Encounter (Signed)
Jennifer [name] states she is having lower back pain and would like a MRI. Please advise.

## 2019-10-28 ENCOUNTER — Encounter: Payer: Self-pay | Admitting: Family Medicine

## 2019-10-28 ENCOUNTER — Telehealth: Payer: Self-pay

## 2019-10-28 DIAGNOSIS — M545 Low back pain: Secondary | ICD-10-CM | POA: Diagnosis not present

## 2019-10-28 DIAGNOSIS — M79672 Pain in left foot: Secondary | ICD-10-CM | POA: Diagnosis not present

## 2019-10-28 DIAGNOSIS — M5030 Other cervical disc degeneration, unspecified cervical region: Secondary | ICD-10-CM

## 2019-10-28 DIAGNOSIS — E611 Iron deficiency: Secondary | ICD-10-CM | POA: Diagnosis not present

## 2019-10-28 NOTE — Telephone Encounter (Signed)
Received call from [place], [place] copy, with [facility]. Darlene states that they have been contacting patient to get scheduled for home health and patient will not commit. She tells them that none of the times they offer are good for her. Patient reports to [facility] that she is not at home and cannot do these appointments, and Carlyon Shadow states she feels patient is NOT home bound, which does not qualify her for home health. They have made several attempts and even offered patient after-hour special visits to accomodate her schedule and patient states "maybe" or "possibly" she could do these, but will not commit and schedule.   Patient has appt with Dr Madilyn Fireman on Friday, advised patient I would let Dr Madilyn Fireman know the situation.  Darlene CB# [phone]

## 2019-10-28 NOTE — Telephone Encounter (Signed)
Left message advising of recommendations.  

## 2019-10-28 NOTE — Telephone Encounter (Signed)
Please place referral

## 2019-10-30 ENCOUNTER — Other Ambulatory Visit: Payer: Self-pay

## 2019-10-30 ENCOUNTER — Emergency Department (HOSPITAL_BASED_OUTPATIENT_CLINIC_OR_DEPARTMENT_OTHER)
Admission: EM | Admit: 2019-10-30 | Discharge: 2019-10-30 | Disposition: A | Discharge status: Home / Self Care | Payer: Medicare HMO | Attending: Emergency Medicine | Admitting: Emergency Medicine

## 2019-10-30 ENCOUNTER — Encounter (HOSPITAL_BASED_OUTPATIENT_CLINIC_OR_DEPARTMENT_OTHER): Payer: Self-pay

## 2019-10-30 DIAGNOSIS — R519 Headache, unspecified: Secondary | ICD-10-CM

## 2019-10-30 DIAGNOSIS — R000 Tachycardia, unspecified: Secondary | ICD-10-CM | POA: Diagnosis not present

## 2019-10-30 DIAGNOSIS — Z87891 Personal history of nicotine dependence: Secondary | ICD-10-CM | POA: Insufficient documentation

## 2019-10-30 DIAGNOSIS — Z79899 Other long term (current) drug therapy: Secondary | ICD-10-CM | POA: Diagnosis not present

## 2019-10-30 DIAGNOSIS — J0181 Other acute recurrent sinusitis: Secondary | ICD-10-CM | POA: Diagnosis not present

## 2019-10-30 DIAGNOSIS — E876 Hypokalemia: Secondary | ICD-10-CM | POA: Diagnosis not present

## 2019-10-30 DIAGNOSIS — R002 Palpitations: Secondary | ICD-10-CM | POA: Diagnosis not present

## 2019-10-30 DIAGNOSIS — R079 Chest pain, unspecified: Secondary | ICD-10-CM | POA: Diagnosis not present

## 2019-10-30 DIAGNOSIS — R9431 Abnormal electrocardiogram [ECG] [EKG]: Secondary | ICD-10-CM | POA: Diagnosis not present

## 2019-10-30 DIAGNOSIS — J019 Acute sinusitis, unspecified: Secondary | ICD-10-CM | POA: Diagnosis not present

## 2019-10-30 DIAGNOSIS — J0191 Acute recurrent sinusitis, unspecified: Secondary | ICD-10-CM

## 2019-10-30 DIAGNOSIS — F419 Anxiety disorder, unspecified: Secondary | ICD-10-CM | POA: Diagnosis not present

## 2019-10-30 DIAGNOSIS — J45909 Unspecified asthma, uncomplicated: Secondary | ICD-10-CM | POA: Insufficient documentation

## 2019-10-30 DIAGNOSIS — I10 Essential (primary) hypertension: Secondary | ICD-10-CM | POA: Diagnosis not present

## 2019-10-30 MED ORDER — CLINDAMYCIN HCL 150 MG PO CAPS: 150.0000 mg | ORAL_CAPSULE | Freq: Four times a day (QID) | ORAL | 0 refills | Status: DC

## 2019-10-30 NOTE — Discharge Instructions (Addendum)
You were seen in the emergency department for evaluation of headache sinus congestion and intermittent shortness of breath and palpitations.  Your EKG did not show any significant abnormalities.  Please contact ENT for an evaluation.  We are prescribing you antibiotics for a possible bacterial sinus infection.  Continue your nasal inhaler.  Follow-up with your doctor.
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
## 2019-10-30 NOTE — ED Triage Notes (Signed)
Pt reports headache since Tuesday, states has recently been treated for sinus infection.  Denies fever, denies cough, using otc remedies.

## 2019-10-30 NOTE — ED Notes (Signed)
Pt on monitor 

## 2019-10-31 ENCOUNTER — Telehealth (INDEPENDENT_AMBULATORY_CARE_PROVIDER_SITE_OTHER): Payer: Medicare HMO | Admitting: Family Medicine

## 2019-10-31 ENCOUNTER — Encounter: Payer: Self-pay | Admitting: Family Medicine

## 2019-10-31 VITALS — BP 159/88

## 2019-10-31 DIAGNOSIS — M549 Dorsalgia, unspecified: Secondary | ICD-10-CM | POA: Diagnosis not present

## 2019-10-31 DIAGNOSIS — M7062 Trochanteric bursitis, left hip: Secondary | ICD-10-CM | POA: Insufficient documentation

## 2019-10-31 DIAGNOSIS — J310 Chronic rhinitis: Secondary | ICD-10-CM

## 2019-10-31 DIAGNOSIS — J3489 Other specified disorders of nose and nasal sinuses: Secondary | ICD-10-CM

## 2019-10-31 DIAGNOSIS — C4499 Other specified malignant neoplasm of skin, unspecified: Secondary | ICD-10-CM

## 2019-10-31 DIAGNOSIS — I10 Essential (primary) hypertension: Secondary | ICD-10-CM | POA: Diagnosis not present

## 2019-10-31 DIAGNOSIS — I471 Supraventricular tachycardia: Secondary | ICD-10-CM

## 2019-10-31 DIAGNOSIS — J0191 Acute recurrent sinusitis, unspecified: Secondary | ICD-10-CM

## 2019-10-31 DIAGNOSIS — E876 Hypokalemia: Secondary | ICD-10-CM | POA: Diagnosis not present

## 2019-10-31 DIAGNOSIS — R002 Palpitations: Secondary | ICD-10-CM

## 2019-10-31 DIAGNOSIS — C519 Malignant neoplasm of vulva, unspecified: Secondary | ICD-10-CM

## 2019-10-31 MED ORDER — CLINDAMYCIN HCL 75 MG PO CAPS: 75.0000 mg | ORAL_CAPSULE | Freq: Four times a day (QID) | ORAL | 0 refills | Status: DC

## 2019-10-31 NOTE — Assessment & Plan Note (Signed)
I really feel like she would benefit from formal physical therapy possibly an injection.  She is planning on seeing the orthopedist in about a week and a half.

## 2019-10-31 NOTE — Progress Notes (Signed)
Virtual Visit via Video Note  I connected with ALLEYNE RUDGE on 10/31/19 at  2:20 PM EDT by a video enabled telemedicine application and verified that I am speaking with the correct person using two identifiers.   I discussed the limitations of evaluation and management by telemedicine and the availability of in person appointments. The patient expressed understanding and agreed to proceed.  Subjective:    CC: Routine follow-up, was seen in the ED yesterday.  HPI:  She went to the ED yesterday twice.  She was seen at [facility] for sinus pressure and HA for several days.  She was treated for sinuses about 2 weeks ago and started on new nasal spray for her allergies. Felt like she was going to "Fall in the floor" after she used it.  Felt it help open up her nose some.  Sinues are giving her Headaches.   She then went to [facility] later in the day for Palpitations. She was feeling her hear flip flop repetatively.  Potassium was low at 3.3.   She hadn't been taking her potassium bc her last few labs had been normal for potassium. Had just had it checked a week ago. She has an appt with cardiology 11/03/19.  Still having severe back and hip pain.  We have referred her form PT via [place]. Home Health called [place] to let us know that she wouldn't commit to a time. Pt says she asked them for a Friday and Saturday.we put in a referral for ortho for her. Had recent fall where she hit her back on a metal plantar and then a couple of years ago fell while skating.    Past medical history, Surgical history, Family history not pertinant except as noted below, Social history, Allergies, and medications have been entered into the medical record, reviewed, and corrections made.   Review of Systems: No fevers, chills, night sweats, weight loss, chest pain, or shortness of breath.   Objective:    General: Speaking clearly in complete sentences without any shortness of breath.  Alert and  oriented x3.  Normal judgment. No apparent acute distress.    Impression and Recommendations:    Hypokalemia Encouraged her to restart her potassium daily.  15 mL.  Plan to recheck in one week.   Hypertension with intolerance to multiple antihypertensive drugs Uncontrolled.  BP 159/88 today.  Didn't start the clonidine patch.  She wanted to get her additional cardiac testing before starting the patch.  I do not think it would interfere with the testing that she is having but it is okay to just ask cardiologist if it is okay to go ahead and start it or she should wait until afterwards.  SVT (supraventricular tachycardia) ([place]) Has appt with Cardiology next week. Feels like has been having palpitations more frequently.   Acute recurrent sinusitis She was given a higher strength of the clindamycin so she didn't pick it up. Would like refill on the lower strength.  New prescription sent to pharmacy.  Encouraged her to try the Qnasl again for allergic rhinitis.  Paget's disease of vulva Hx of Paget's. Refer back to his gyn.  Has noticed a new itchy rough textured spot on the inner labia.    Chronic rhinitis Encouraged her to try the Qnasl again.  Trochanteric bursitis of left hip I really feel like she would benefit from formal physical therapy possibly an injection.  She is planning on seeing the orthopedist in about a week  and a half.  Acute right-sided back pain Has an appointment coming up with Ortho.  Did discuss with her that I still think she should try formal physical therapy before getting additional imaging even though she has had a history of 2 falls but certainly will leave it up to the orthopedist to decide what I think is best.   Right sided back pain and trochanteric bursitis. She has appt with ortho in about 9 days.  She is using IBU 200mg  some.  Says upsets stomach if takes it too often. Offered muscle relaxer and gabapentin.  Pt declined for now.  Will see what ortho says.      Time spent in encounter 45 minutes including reviewing the ED notes.    I discussed the assessment and treatment plan with the patient. The patient was provided an opportunity to ask questions and all were answered. The patient agreed with the plan and demonstrated an understanding of the instructions.   The patient was advised to call back or seek an in-person evaluation if the symptoms worsen or if the condition fails to improve as anticipated.   Beatrice Lecher, MD

## 2019-10-31 NOTE — Assessment & Plan Note (Signed)
Hx of Paget's. Refer back to his gyn.  Has noticed a new itchy rough textured spot on the inner labia.

## 2019-10-31 NOTE — Assessment & Plan Note (Signed)
Has an appointment coming up with Ortho.  Did discuss with her that I still think she should try formal physical therapy before getting additional imaging even though she has had a history of 2 falls but certainly will leave it up to the orthopedist to decide what I think is best.

## 2019-10-31 NOTE — Assessment & Plan Note (Signed)
Has appt with Cardiology next week. Feels like has been having palpitations more frequently.

## 2019-10-31 NOTE — Assessment & Plan Note (Addendum)
Uncontrolled.  BP 159/88 today.  Didn't start the clonidine patch.  She wanted to get her additional cardiac testing before starting the patch.  I do not think it would interfere with the testing that she is having but it is okay to just ask cardiologist if it is okay to go ahead and start it or she should wait until afterwards.

## 2019-10-31 NOTE — Assessment & Plan Note (Addendum)
Encouraged her to restart her potassium daily.  15 mL.  Plan to recheck in one week.

## 2019-10-31 NOTE — Assessment & Plan Note (Addendum)
She was given a higher strength of the clindamycin so she didn't pick it up. Would like refill on the lower strength.  New prescription sent to pharmacy.  Encouraged her to try the Qnasl again for allergic rhinitis.

## 2019-10-31 NOTE — Assessment & Plan Note (Signed)
Encouraged her to try the Qnasl again.

## 2019-11-03 DIAGNOSIS — R9431 Abnormal electrocardiogram [ECG] [EKG]: Secondary | ICD-10-CM | POA: Diagnosis not present

## 2019-11-03 DIAGNOSIS — R079 Chest pain, unspecified: Secondary | ICD-10-CM | POA: Diagnosis not present

## 2019-11-03 DIAGNOSIS — R42 Dizziness and giddiness: Secondary | ICD-10-CM | POA: Diagnosis not present

## 2019-11-03 DIAGNOSIS — R110 Nausea: Secondary | ICD-10-CM | POA: Diagnosis not present

## 2019-11-03 DIAGNOSIS — Z87891 Personal history of nicotine dependence: Secondary | ICD-10-CM | POA: Diagnosis not present

## 2019-11-03 DIAGNOSIS — I493 Ventricular premature depolarization: Secondary | ICD-10-CM | POA: Diagnosis not present

## 2019-11-03 DIAGNOSIS — R002 Palpitations: Secondary | ICD-10-CM | POA: Diagnosis not present

## 2019-11-03 DIAGNOSIS — I471 Supraventricular tachycardia: Secondary | ICD-10-CM | POA: Diagnosis not present

## 2019-11-03 LAB — BASIC METABOLIC PANEL
BUN: 18 (ref 4–21)
CO2: 25 — AB (ref 13–22)
Chloride: 108 (ref 99–108)
Creatinine: 0.6 (ref 0.5–1.1)
Glucose: 167
Potassium: 3.8 (ref 3.4–5.3)
Sodium: 140 (ref 137–147)

## 2019-11-03 LAB — COMPREHENSIVE METABOLIC PANEL: Calcium: 8.4 — AB (ref 8.7–10.7)

## 2019-11-04 DIAGNOSIS — G35 Multiple sclerosis: Secondary | ICD-10-CM | POA: Diagnosis not present

## 2019-11-04 DIAGNOSIS — R29898 Other symptoms and signs involving the musculoskeletal system: Secondary | ICD-10-CM | POA: Diagnosis not present

## 2019-11-04 DIAGNOSIS — M546 Pain in thoracic spine: Secondary | ICD-10-CM | POA: Diagnosis not present

## 2019-11-04 DIAGNOSIS — M1612 Unilateral primary osteoarthritis, left hip: Secondary | ICD-10-CM | POA: Diagnosis not present

## 2019-11-04 DIAGNOSIS — M549 Dorsalgia, unspecified: Secondary | ICD-10-CM | POA: Diagnosis not present

## 2019-11-04 DIAGNOSIS — M542 Cervicalgia: Secondary | ICD-10-CM | POA: Diagnosis not present

## 2019-11-04 DIAGNOSIS — G8929 Other chronic pain: Secondary | ICD-10-CM | POA: Diagnosis not present

## 2019-11-04 DIAGNOSIS — M25552 Pain in left hip: Secondary | ICD-10-CM | POA: Diagnosis not present

## 2019-11-05 DIAGNOSIS — Z8639 Personal history of other endocrine, nutritional and metabolic disease: Secondary | ICD-10-CM | POA: Diagnosis not present

## 2019-11-05 DIAGNOSIS — F419 Anxiety disorder, unspecified: Secondary | ICD-10-CM | POA: Diagnosis not present

## 2019-11-05 DIAGNOSIS — R0602 Shortness of breath: Secondary | ICD-10-CM | POA: Diagnosis not present

## 2019-11-05 DIAGNOSIS — F329 Major depressive disorder, single episode, unspecified: Secondary | ICD-10-CM | POA: Diagnosis not present

## 2019-11-05 DIAGNOSIS — R531 Weakness: Secondary | ICD-10-CM | POA: Diagnosis not present

## 2019-11-05 DIAGNOSIS — Z8679 Personal history of other diseases of the circulatory system: Secondary | ICD-10-CM | POA: Diagnosis not present

## 2019-11-05 DIAGNOSIS — G35 Multiple sclerosis: Secondary | ICD-10-CM | POA: Diagnosis not present

## 2019-11-06 ENCOUNTER — Telehealth: Payer: Self-pay | Admitting: Family Medicine

## 2019-11-06 DIAGNOSIS — R9431 Abnormal electrocardiogram [ECG] [EKG]: Secondary | ICD-10-CM | POA: Diagnosis not present

## 2019-11-06 NOTE — Telephone Encounter (Signed)
Received fax from [facility] for Tier exception on QNASL sent through cover my meds waiting on determination. - CF

## 2019-11-07 DIAGNOSIS — R1010 Upper abdominal pain, unspecified: Secondary | ICD-10-CM | POA: Diagnosis not present

## 2019-11-07 DIAGNOSIS — R0602 Shortness of breath: Secondary | ICD-10-CM | POA: Diagnosis not present

## 2019-11-07 DIAGNOSIS — R079 Chest pain, unspecified: Secondary | ICD-10-CM | POA: Diagnosis not present

## 2019-11-07 DIAGNOSIS — R000 Tachycardia, unspecified: Secondary | ICD-10-CM | POA: Diagnosis not present

## 2019-11-07 DIAGNOSIS — G35 Multiple sclerosis: Secondary | ICD-10-CM | POA: Diagnosis not present

## 2019-11-07 DIAGNOSIS — G8929 Other chronic pain: Secondary | ICD-10-CM | POA: Diagnosis not present

## 2019-11-07 DIAGNOSIS — E878 Other disorders of electrolyte and fluid balance, not elsewhere classified: Secondary | ICD-10-CM | POA: Diagnosis not present

## 2019-11-07 DIAGNOSIS — M7989 Other specified soft tissue disorders: Secondary | ICD-10-CM | POA: Diagnosis not present

## 2019-11-08 DIAGNOSIS — R079 Chest pain, unspecified: Secondary | ICD-10-CM | POA: Diagnosis not present

## 2019-11-08 DIAGNOSIS — R600 Localized edema: Secondary | ICD-10-CM | POA: Diagnosis not present

## 2019-11-08 DIAGNOSIS — G35 Multiple sclerosis: Secondary | ICD-10-CM | POA: Diagnosis not present

## 2019-11-08 DIAGNOSIS — E785 Hyperlipidemia, unspecified: Secondary | ICD-10-CM | POA: Diagnosis not present

## 2019-11-08 DIAGNOSIS — Z886 Allergy status to analgesic agent status: Secondary | ICD-10-CM | POA: Diagnosis not present

## 2019-11-08 DIAGNOSIS — Z8669 Personal history of other diseases of the nervous system and sense organs: Secondary | ICD-10-CM | POA: Diagnosis not present

## 2019-11-08 DIAGNOSIS — R0602 Shortness of breath: Secondary | ICD-10-CM | POA: Diagnosis not present

## 2019-11-08 DIAGNOSIS — I10 Essential (primary) hypertension: Secondary | ICD-10-CM | POA: Diagnosis not present

## 2019-11-08 DIAGNOSIS — Z87891 Personal history of nicotine dependence: Secondary | ICD-10-CM | POA: Diagnosis not present

## 2019-11-08 DIAGNOSIS — Z881 Allergy status to other antibiotic agents status: Secondary | ICD-10-CM | POA: Diagnosis not present

## 2019-11-08 DIAGNOSIS — E119 Type 2 diabetes mellitus without complications: Secondary | ICD-10-CM | POA: Diagnosis not present

## 2019-11-09 ENCOUNTER — Other Ambulatory Visit: Payer: Self-pay

## 2019-11-09 ENCOUNTER — Encounter (HOSPITAL_BASED_OUTPATIENT_CLINIC_OR_DEPARTMENT_OTHER): Payer: Self-pay | Admitting: Emergency Medicine

## 2019-11-09 ENCOUNTER — Emergency Department (HOSPITAL_BASED_OUTPATIENT_CLINIC_OR_DEPARTMENT_OTHER)
Admission: EM | Admit: 2019-11-09 | Discharge: 2019-11-09 | Disposition: A | Discharge status: Home / Self Care | Payer: Medicare HMO | Attending: Emergency Medicine | Admitting: Emergency Medicine

## 2019-11-09 DIAGNOSIS — Z885 Allergy status to narcotic agent status: Secondary | ICD-10-CM | POA: Insufficient documentation

## 2019-11-09 DIAGNOSIS — Z880 Allergy status to penicillin: Secondary | ICD-10-CM | POA: Insufficient documentation

## 2019-11-09 DIAGNOSIS — Z79899 Other long term (current) drug therapy: Secondary | ICD-10-CM | POA: Diagnosis not present

## 2019-11-09 DIAGNOSIS — I10 Essential (primary) hypertension: Secondary | ICD-10-CM | POA: Insufficient documentation

## 2019-11-09 DIAGNOSIS — Z888 Allergy status to other drugs, medicaments and biological substances status: Secondary | ICD-10-CM | POA: Diagnosis not present

## 2019-11-09 DIAGNOSIS — J45909 Unspecified asthma, uncomplicated: Secondary | ICD-10-CM | POA: Insufficient documentation

## 2019-11-09 DIAGNOSIS — E785 Hyperlipidemia, unspecified: Secondary | ICD-10-CM | POA: Diagnosis not present

## 2019-11-09 DIAGNOSIS — M7989 Other specified soft tissue disorders: Secondary | ICD-10-CM | POA: Diagnosis not present

## 2019-11-09 DIAGNOSIS — G35 Multiple sclerosis: Secondary | ICD-10-CM | POA: Diagnosis not present

## 2019-11-09 DIAGNOSIS — R2243 Localized swelling, mass and lump, lower limb, bilateral: Secondary | ICD-10-CM | POA: Diagnosis not present

## 2019-11-09 DIAGNOSIS — Z882 Allergy status to sulfonamides status: Secondary | ICD-10-CM | POA: Diagnosis not present

## 2019-11-09 DIAGNOSIS — Z881 Allergy status to other antibiotic agents status: Secondary | ICD-10-CM | POA: Diagnosis not present

## 2019-11-09 DIAGNOSIS — Z87891 Personal history of nicotine dependence: Secondary | ICD-10-CM | POA: Diagnosis not present

## 2019-11-09 LAB — URINALYSIS, ROUTINE W REFLEX MICROSCOPIC
Bilirubin Urine: NEGATIVE
Glucose, UA: NEGATIVE mg/dL
Hgb urine dipstick: NEGATIVE
Ketones, ur: NEGATIVE mg/dL
Leukocytes,Ua: NEGATIVE
Nitrite: NEGATIVE
Protein, ur: NEGATIVE mg/dL
Specific Gravity, Urine: 1.025 (ref 1.005–1.030)
pH: 5.5 (ref 5.0–8.0)
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
## 2019-11-09 NOTE — ED Triage Notes (Signed)
Pt c/o swelling and pain to backs of both legs and lower abd area x 2 days

## 2019-11-09 NOTE — Discharge Instructions (Signed)
Please elevate your legs when possible.  Your blood work from yesterday does not indicate blood clot.  Your urine does not have evidence of infection,

## 2019-11-17 DIAGNOSIS — Z87891 Personal history of nicotine dependence: Secondary | ICD-10-CM | POA: Diagnosis not present

## 2019-11-17 DIAGNOSIS — R0602 Shortness of breath: Secondary | ICD-10-CM | POA: Diagnosis not present

## 2019-11-17 DIAGNOSIS — F419 Anxiety disorder, unspecified: Secondary | ICD-10-CM | POA: Diagnosis not present

## 2019-11-17 DIAGNOSIS — R002 Palpitations: Secondary | ICD-10-CM | POA: Diagnosis not present

## 2019-11-17 DIAGNOSIS — R5383 Other fatigue: Secondary | ICD-10-CM | POA: Diagnosis not present

## 2019-11-17 DIAGNOSIS — R0789 Other chest pain: Secondary | ICD-10-CM | POA: Diagnosis not present

## 2019-11-17 DIAGNOSIS — R000 Tachycardia, unspecified: Secondary | ICD-10-CM | POA: Diagnosis not present

## 2019-11-17 DIAGNOSIS — R3915 Urgency of urination: Secondary | ICD-10-CM | POA: Diagnosis not present

## 2019-11-17 DIAGNOSIS — R42 Dizziness and giddiness: Secondary | ICD-10-CM | POA: Diagnosis not present

## 2019-11-18 ENCOUNTER — Encounter: Payer: Self-pay | Admitting: Family Medicine

## 2019-11-18 ENCOUNTER — Telehealth (INDEPENDENT_AMBULATORY_CARE_PROVIDER_SITE_OTHER): Payer: Medicare HMO | Admitting: Family Medicine

## 2019-11-18 ENCOUNTER — Telehealth: Payer: Self-pay | Admitting: Family Medicine

## 2019-11-18 DIAGNOSIS — R002 Palpitations: Secondary | ICD-10-CM | POA: Diagnosis not present

## 2019-11-18 DIAGNOSIS — I471 Supraventricular tachycardia, unspecified: Secondary | ICD-10-CM

## 2019-11-18 DIAGNOSIS — R42 Dizziness and giddiness: Secondary | ICD-10-CM | POA: Diagnosis not present

## 2019-11-18 DIAGNOSIS — M549 Dorsalgia, unspecified: Secondary | ICD-10-CM

## 2019-11-18 DIAGNOSIS — C519 Malignant neoplasm of vulva, unspecified: Secondary | ICD-10-CM

## 2019-11-18 DIAGNOSIS — C4499 Other specified malignant neoplasm of skin, unspecified: Secondary | ICD-10-CM

## 2019-11-18 MED ORDER — MECLIZINE HCL 25 MG PO TABS: 25.0000 mg | ORAL_TABLET | Freq: Three times a day (TID) | ORAL | 0 refills | Status: DC | PRN

## 2019-11-18 NOTE — Progress Notes (Signed)
Virtual Visit via Video Note  I connected with Jennifer [name] on 11/18/19 at  9:30 AM EDT by a video enabled telemedicine application and verified that I am speaking with the correct person using two identifiers.   I discussed the limitations of evaluation and management by telemedicine and the availability of in person appointments. The patient expressed understanding and agreed to proceed.  Subjective:    CC: Follow-up  HPI:  She reports that she has not been feeling well.  In fact she had one night where she barely slept at all.  She actually did going to the emergency department because she had an episode of sustained heart quivering that lasted about 3 minutes and ended up calling 911.  She ended up going home after initial work-up as you should because she felt exhausted.  She is been mostly trying to rest since then.  She still feels short of breath particularly when she is lying down she says just hard to get the breath in but it feels okay to get the breath out.  She is actually scheduled for a stress test tomorrow so hopefully should be able to make that.  Today she also feels dizzy.  But that also started yesterday just feels like she is really offkilter and leaning to the left.  She has been having some spring allergy symptoms as well.  She was told that her EKG was normal in the ED.  She wonders if something like meclizine would be helpful for her.  Low back pain-she actually did see the orthopedist and they had scheduled her for an MRI last Friday but she had a severe headache and was unable to make the appointment but has called to reschedule it.  She still has not heard back about physical therapy.  We had received a note from them saying that they did not feel that she was truly homebound.  We can try to get in touch with a different company to see what they think.  She does drive occasionally but is very limited pretty much to the pharmacy and to her doctor's appointments.  Most  of the time she does not drive because she does not feel well.  Past medical history, Surgical history, Family history not pertinant except as noted below, Social history, Allergies, and medications have been entered into the medical record, reviewed, and corrections made.   Review of Systems: No fevers, chills, night sweats, weight loss, chest pain, or shortness of breath.   Objective:    General: Speaking clearly in complete sentences without any shortness of breath.  Alert and oriented x3.  Normal judgment. No apparent acute distress.    Impression and Recommendations:    Paget's disease of vulva Evidently the original surgeon for her Paget's has retired but she is scheduled to see one of their partners sometime next month.  Dizziness-unclear if related to her sinuses or actual vertigo.  Difficult to determine without an exam.  But I did send over prescription for meclizine to use.  She thinks she is taking it years ago.  Low back pain-[place]etter make sure to go ahead and reschedule her MRI.  Reminded her that there is a time limit for approval on those.  I still feel like she would benefit from formal physical therapy.  We will see if we can get this through a different home health agency but it may turn out that she might not qualify because she does drive some and may have to set  up an appointment at a local agency.  Though which she is not feeling well she will unfortunately probably miss a lot of her appointments.  Seasonal allergies-continue nasal steroid spray.  Heart palpitations history of SVT.  She does have a stress test scheduled for tomorrow so hopefully she will be able to make that think it is important for her to try to keep that if at all possible.  Shortness of breath-unclear etiology just discussed trying to make sure that she is sitting up throughout the day and not lying down too much.  That can lead to atelectasis which does not help the lung state nice and open.   She does have a Xopenex inhaler which she can use if needed as well.    Time spent in encounter 45 minutes  I discussed the assessment and treatment plan with the patient. The patient was provided an opportunity to ask questions and all were answered. The patient agreed with the plan and demonstrated an understanding of the instructions.   The patient was advised to call back or seek an in-person evaluation if the symptoms worsen or if the condition fails to improve as anticipated.   Beatrice Lecher, MD

## 2019-11-18 NOTE — Telephone Encounter (Signed)
Update: As you requested Dr.Metheney, I sent [place] referral to [facility] and waiting to hear back from them. [name] had previously tried [facility] and [facility].  I also called pt at her to try to get her some f/u appts scheduled with you as we usually do every two weeks but I had to leave her a voicemail to call us back to get on the schedule. I just wanted to update you

## 2019-11-18 NOTE — Progress Notes (Signed)
Did not start Clonidine patch until she speaks w/Cardiology. She stated that she has a nuclear stress test tomorrow.

## 2019-11-18 NOTE — Assessment & Plan Note (Signed)
Evidently the original surgeon for her Paget's has retired but she is scheduled to see one of their partners sometime next month.

## 2019-11-19 DIAGNOSIS — E876 Hypokalemia: Secondary | ICD-10-CM | POA: Diagnosis not present

## 2019-11-19 DIAGNOSIS — I493 Ventricular premature depolarization: Secondary | ICD-10-CM | POA: Diagnosis not present

## 2019-11-19 LAB — BASIC METABOLIC PANEL
BUN: 12 (ref 4–21)
CO2: 26 — AB (ref 13–22)
Chloride: 109 — AB (ref 99–108)
Creatinine: 0.6 (ref 0.5–1.1)
Glucose: 107
Potassium: 3.7 (ref 3.4–5.3)
Sodium: 141 (ref 137–147)

## 2019-11-19 LAB — COMPREHENSIVE METABOLIC PANEL
Calcium: 8.5 — AB (ref 8.7–10.7)
GFR calc non Af Amer: >90

## 2019-11-21 ENCOUNTER — Telehealth: Payer: Self-pay | Admitting: Family Medicine

## 2019-11-21 DIAGNOSIS — M79671 Pain in right foot: Secondary | ICD-10-CM | POA: Diagnosis not present

## 2019-11-21 DIAGNOSIS — S59902A Unspecified injury of left elbow, initial encounter: Secondary | ICD-10-CM | POA: Diagnosis not present

## 2019-11-21 DIAGNOSIS — S99921A Unspecified injury of right foot, initial encounter: Secondary | ICD-10-CM | POA: Diagnosis not present

## 2019-11-21 DIAGNOSIS — M545 Low back pain: Secondary | ICD-10-CM | POA: Diagnosis not present

## 2019-11-21 DIAGNOSIS — Y999 Unspecified external cause status: Secondary | ICD-10-CM | POA: Diagnosis not present

## 2019-11-21 DIAGNOSIS — W010XXA Fall on same level from slipping, tripping and stumbling without subsequent striking against object, initial encounter: Secondary | ICD-10-CM | POA: Diagnosis not present

## 2019-11-21 DIAGNOSIS — M25522 Pain in left elbow: Secondary | ICD-10-CM | POA: Diagnosis not present

## 2019-11-21 DIAGNOSIS — I708 Atherosclerosis of other arteries: Secondary | ICD-10-CM | POA: Diagnosis not present

## 2019-11-21 DIAGNOSIS — M47816 Spondylosis without myelopathy or radiculopathy, lumbar region: Secondary | ICD-10-CM | POA: Diagnosis not present

## 2019-11-21 DIAGNOSIS — S3992XA Unspecified injury of lower back, initial encounter: Secondary | ICD-10-CM | POA: Diagnosis not present

## 2019-11-21 DIAGNOSIS — M19022 Primary osteoarthritis, left elbow: Secondary | ICD-10-CM | POA: Diagnosis not present

## 2019-11-21 NOTE — Telephone Encounter (Signed)
We have tried [facility], [place] and can not get pt a home health referral, please advise

## 2019-11-24 ENCOUNTER — Encounter: Payer: Self-pay | Admitting: Family Medicine

## 2019-11-24 DIAGNOSIS — S90121A Contusion of right lesser toe(s) without damage to nail, initial encounter: Secondary | ICD-10-CM | POA: Diagnosis not present

## 2019-11-24 DIAGNOSIS — M25552 Pain in left hip: Secondary | ICD-10-CM | POA: Diagnosis not present

## 2019-11-24 DIAGNOSIS — M25521 Pain in right elbow: Secondary | ICD-10-CM | POA: Diagnosis not present

## 2019-11-24 DIAGNOSIS — S59901A Unspecified injury of right elbow, initial encounter: Secondary | ICD-10-CM | POA: Diagnosis not present

## 2019-11-24 DIAGNOSIS — M25511 Pain in right shoulder: Secondary | ICD-10-CM | POA: Diagnosis not present

## 2019-11-24 DIAGNOSIS — M549 Dorsalgia, unspecified: Secondary | ICD-10-CM | POA: Diagnosis not present

## 2019-11-24 DIAGNOSIS — M79601 Pain in right arm: Secondary | ICD-10-CM | POA: Diagnosis not present

## 2019-11-24 DIAGNOSIS — S9031XA Contusion of right foot, initial encounter: Secondary | ICD-10-CM | POA: Diagnosis not present

## 2019-11-24 DIAGNOSIS — M545 Low back pain: Secondary | ICD-10-CM | POA: Diagnosis not present

## 2019-11-24 DIAGNOSIS — G8929 Other chronic pain: Secondary | ICD-10-CM | POA: Diagnosis not present

## 2019-11-24 DIAGNOSIS — E876 Hypokalemia: Secondary | ICD-10-CM | POA: Diagnosis not present

## 2019-11-24 DIAGNOSIS — M7989 Other specified soft tissue disorders: Secondary | ICD-10-CM | POA: Diagnosis not present

## 2019-11-24 DIAGNOSIS — W108XXA Fall (on) (from) other stairs and steps, initial encounter: Secondary | ICD-10-CM | POA: Diagnosis not present

## 2019-11-24 DIAGNOSIS — Y999 Unspecified external cause status: Secondary | ICD-10-CM | POA: Diagnosis not present

## 2019-11-24 DIAGNOSIS — M542 Cervicalgia: Secondary | ICD-10-CM | POA: Diagnosis not present

## 2019-11-24 DIAGNOSIS — R2231 Localized swelling, mass and lump, right upper limb: Secondary | ICD-10-CM | POA: Diagnosis not present

## 2019-11-24 NOTE — Telephone Encounter (Signed)
Jennifer [name] advised. She will follow up on her Monday appointment. She did have the MRI today. The results are in [place].

## 2019-11-24 NOTE — Telephone Encounter (Signed)
Please call patient and let her know that we have now tried 3 different companies and they are not qualifying her for home health because they feel like she is not truly "homebound".  Her only option at this point would be for [place] to refer her to physical therapy somewhere near her home

## 2019-11-24 NOTE — Telephone Encounter (Signed)
Jennifer [name] was advised to take OTC pain medication as directed on the bottle as long as she is not allergic. She has been scheduled for a follow up next Monday.

## 2019-11-25 DIAGNOSIS — M79601 Pain in right arm: Secondary | ICD-10-CM | POA: Diagnosis not present

## 2019-11-25 DIAGNOSIS — R2231 Localized swelling, mass and lump, right upper limb: Secondary | ICD-10-CM | POA: Diagnosis not present

## 2019-11-26 ENCOUNTER — Telehealth: Payer: Self-pay | Admitting: Family Medicine

## 2019-11-26 ENCOUNTER — Emergency Department (HOSPITAL_BASED_OUTPATIENT_CLINIC_OR_DEPARTMENT_OTHER)
Admission: EM | Admit: 2019-11-26 | Discharge: 2019-11-26 | Disposition: A | Discharge status: Home / Self Care | Payer: Medicare HMO | Attending: Emergency Medicine | Admitting: Emergency Medicine

## 2019-11-26 ENCOUNTER — Other Ambulatory Visit: Payer: Self-pay

## 2019-11-26 ENCOUNTER — Encounter (HOSPITAL_BASED_OUTPATIENT_CLINIC_OR_DEPARTMENT_OTHER): Payer: Self-pay | Admitting: *Deleted

## 2019-11-26 DIAGNOSIS — Z91010 Allergy to peanuts: Secondary | ICD-10-CM | POA: Diagnosis not present

## 2019-11-26 DIAGNOSIS — J069 Acute upper respiratory infection, unspecified: Secondary | ICD-10-CM | POA: Diagnosis not present

## 2019-11-26 DIAGNOSIS — R0602 Shortness of breath: Secondary | ICD-10-CM | POA: Diagnosis present

## 2019-11-26 DIAGNOSIS — Z87891 Personal history of nicotine dependence: Secondary | ICD-10-CM | POA: Diagnosis not present

## 2019-11-26 DIAGNOSIS — I10 Essential (primary) hypertension: Secondary | ICD-10-CM | POA: Diagnosis not present

## 2019-11-26 DIAGNOSIS — Z20822 Contact with and (suspected) exposure to covid-19: Secondary | ICD-10-CM | POA: Insufficient documentation

## 2019-11-26 DIAGNOSIS — J45909 Unspecified asthma, uncomplicated: Secondary | ICD-10-CM | POA: Insufficient documentation

## 2019-11-26 DIAGNOSIS — Z79899 Other long term (current) drug therapy: Secondary | ICD-10-CM | POA: Insufficient documentation

## 2019-11-26 MED ORDER — DEXAMETHASONE 4 MG PO TABS
ORAL_TABLET | ORAL | Status: AC
  Filled 2019-11-26: qty 1

## 2019-11-26 MED ORDER — DEXAMETHASONE 4 MG PO TABS
2.0000 mg | ORAL_TABLET | Freq: Once | ORAL | Status: AC
  Administered 2019-11-26: 2 mg via ORAL

## 2019-11-26 MED ORDER — DEXAMETHASONE 6 MG PO TABS
10.0000 mg | ORAL_TABLET | Freq: Once | ORAL | Status: DC
  Filled 2019-11-26: qty 1

## 2019-11-26 NOTE — Discharge Instructions (Signed)
Take tylenol 2 pills 4 times a day and motrin 4 pills 3 times a day.  Drink plenty of fluids.  Return for worsening shortness of breath, headache, confusion. Follow up with your family doctor.   
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
## 2019-11-26 NOTE — Telephone Encounter (Signed)
Patient is going to see if she can get a ride tomorrow. States she wants a 15 min slot for a steroid shot. She thinks it is allergies. Wont be long per patient, it will be a short visit and only wants PCP to listen to her and see if she needs a shot. Did advise patient that PCP was full tomorrow and she still wanted me to send message.

## 2019-11-26 NOTE — ED Triage Notes (Signed)
Pt c/o SOb , h/a and generalized fatigue , cough x 3 days

## 2019-11-26 NOTE — ED Notes (Signed)
PT said she can only take 1 or 2 mg of dexamethasone. MD informed

## 2019-11-26 NOTE — Telephone Encounter (Signed)
She be willing to see one of the APP's and maybe I can peek my head in

## 2019-11-27 ENCOUNTER — Encounter: Payer: Self-pay | Admitting: Family Medicine

## 2019-11-27 LAB — SARS CORONAVIRUS 2 (TAT 6-24 HRS): SARS Coronavirus 2: NEGATIVE

## 2019-11-27 NOTE — Telephone Encounter (Signed)
Routing to provider  

## 2019-11-28 NOTE — Telephone Encounter (Signed)
Unfortunately I had my own doctors appt today so I wan't in the office this AM and my afternoon is already booked. I can get you in with one of our PAs.

## 2019-11-30 DIAGNOSIS — Z20822 Contact with and (suspected) exposure to covid-19: Secondary | ICD-10-CM | POA: Diagnosis not present

## 2019-11-30 DIAGNOSIS — B9789 Other viral agents as the cause of diseases classified elsewhere: Secondary | ICD-10-CM | POA: Diagnosis not present

## 2019-11-30 DIAGNOSIS — J069 Acute upper respiratory infection, unspecified: Secondary | ICD-10-CM | POA: Diagnosis not present

## 2019-12-01 ENCOUNTER — Telehealth (INDEPENDENT_AMBULATORY_CARE_PROVIDER_SITE_OTHER): Payer: Medicare HMO | Admitting: Family Medicine

## 2019-12-01 DIAGNOSIS — H538 Other visual disturbances: Secondary | ICD-10-CM

## 2019-12-01 DIAGNOSIS — H8112 Benign paroxysmal vertigo, left ear: Secondary | ICD-10-CM | POA: Diagnosis not present

## 2019-12-01 DIAGNOSIS — R21 Rash and other nonspecific skin eruption: Secondary | ICD-10-CM | POA: Diagnosis not present

## 2019-12-01 DIAGNOSIS — J069 Acute upper respiratory infection, unspecified: Secondary | ICD-10-CM | POA: Diagnosis not present

## 2019-12-01 DIAGNOSIS — I10 Essential (primary) hypertension: Secondary | ICD-10-CM

## 2019-12-01 DIAGNOSIS — L232 Allergic contact dermatitis due to cosmetics: Secondary | ICD-10-CM | POA: Diagnosis not present

## 2019-12-01 DIAGNOSIS — B9789 Other viral agents as the cause of diseases classified elsewhere: Secondary | ICD-10-CM | POA: Diagnosis not present

## 2019-12-01 DIAGNOSIS — Z20822 Contact with and (suspected) exposure to covid-19: Secondary | ICD-10-CM | POA: Diagnosis not present

## 2019-12-01 NOTE — Progress Notes (Signed)
Broke out in hives yesterday on her face and went to the ED.  She hasd bumps and itching on her face and back of her head.  She is not sure if food triggered ro what caused it.  She has She hasn't started the clonidine patch yet.  She has the pill clonidine version ast home. BP were good up until a couple of days ago. Feels nauseated today.  She did have cornbeef that her husband made.  She also felt like she got into too much salt.    BPs from the last couple of days: 171/106, 182/106.  158/96. 152/98    Heat has been fluttering so took her potassium 2 days ago.    Vision has been bothering her again.  Says has been having a lot of sinus congestion and drainage.  Went to the ED on 4/14/ and given a steroid.  She describes it a blurry vision.    Has been having persistent back pain.  They have recommended shots  her back for pain relief.  Says she is getting to the point where she feels it is getting rose.  She fell and injured her right foot. It is still bruised.    She has appt with psychiatry and planning on virtual appt.  Appt schedule for Thursday.    She has had vertigo and has been doing some exercises.  She got some wax out of her ear and feels better. Feels like it is on the left side.

## 2019-12-01 NOTE — Assessment & Plan Note (Signed)
BP not well controlled. Suddenly up in the last 2 days. OK to take her clonidine pill. She would prefer to take half a tab.

## 2019-12-01 NOTE — Progress Notes (Signed)
Virtual Visit via Video Note  I connected with Jennifer [name] on 12/03/19 at  2:20 PM EDT by a video enabled telemedicine application and verified that I am speaking with the correct person using two identifiers.   I discussed the limitations of evaluation and management by telemedicine and the availability of in person appointments. The patient expressed understanding and agreed to proceed.  Subjective:    CC: Hives, not feeling well  HPI:  Broke out in hives yesterday on her face and went to the ED.  She hasd bumps and itching on her face and back of her head.  She is not sure if food triggered ro what caused it.  She has She hasn't started the clonidine patch yet.  She has the pill clonidine version ast home. BP were good up until a couple of days ago. Feels nauseated today.  She did have cornbeef that her husband made.  She also felt like she got into too much salt.    BPs from the last couple of days: 171/106, 182/106.  158/96. 152/98    Heat has been fluttering so took her potassium 2 days ago.    Vision has been bothering her again.  Says has been having a lot of sinus congestion and drainage.  Went to the ED on 4/14/ and given a steroid.  She describes it a blurry vision.    Has been having persistent back pain.  They have recommended shots  her back for pain relief.  Says she is getting to the point where she feels it is getting rose.  She fell and injured her right foot. It is still bruised.    She has appt with psychiatry and planning on virtual appt.  Appt schedule for Thursday.    She has had vertigo and has been doing some exercises.  She got some wax out of her ear and feels better. Feels like it is on the left side.   Past medical history, Surgical history, Family history not pertinant except as noted below, Social history, Allergies, and medications have been entered into the medical record, reviewed, and corrections made.   Review of Systems: No fevers, chills,  night sweats, weight loss, chest pain, or shortness of breath.   Objective:    General: Speaking clearly in complete sentences without any shortness of breath.  Alert and oriented x3.  Normal judgment. No apparent acute distress.    Impression and Recommendations:    Hypertension with intolerance to multiple antihypertensive drugs BP not well controlled. Suddenly up in the last 2 days. OK to take her clonidine pill. She would prefer to take half a tab.    Rash - unclear etiology. Doesn't sound anaphylactic. Possible drug reaction.    BBPV, left will refer for formal Vestibular Rehab.  Referral placed.   Blurry vision-unclear etiology.  Could be related to increased stress.  Also encouraged her to consider seeing her eye doctor if it has been a while since she has been evaluated.  Low back pain -recommended injections for treatment by the orthopedist.  She is following with them for management.    Time spent in encounter 35 minutes  I discussed the assessment and treatment plan with the patient. The patient was provided an opportunity to ask questions and all were answered. The patient agreed with the plan and demonstrated an understanding of the instructions.   The patient was advised to call back or seek an in-person evaluation if the symptoms worsen or if  the condition fails to improve as anticipated.   Beatrice Lecher, MD

## 2019-12-03 ENCOUNTER — Encounter: Payer: Self-pay | Admitting: Family Medicine

## 2019-12-03 DIAGNOSIS — E876 Hypokalemia: Secondary | ICD-10-CM | POA: Diagnosis not present

## 2019-12-03 DIAGNOSIS — M79602 Pain in left arm: Secondary | ICD-10-CM | POA: Diagnosis not present

## 2019-12-03 DIAGNOSIS — I493 Ventricular premature depolarization: Secondary | ICD-10-CM | POA: Diagnosis not present

## 2019-12-03 DIAGNOSIS — I491 Atrial premature depolarization: Secondary | ICD-10-CM | POA: Diagnosis not present

## 2019-12-04 ENCOUNTER — Ambulatory Visit (INDEPENDENT_AMBULATORY_CARE_PROVIDER_SITE_OTHER): Payer: Medicare HMO | Admitting: Psychologist

## 2019-12-04 DIAGNOSIS — R42 Dizziness and giddiness: Secondary | ICD-10-CM | POA: Diagnosis not present

## 2019-12-04 DIAGNOSIS — R6884 Jaw pain: Secondary | ICD-10-CM | POA: Diagnosis not present

## 2019-12-04 DIAGNOSIS — R519 Headache, unspecified: Secondary | ICD-10-CM | POA: Diagnosis not present

## 2019-12-04 DIAGNOSIS — I10 Essential (primary) hypertension: Secondary | ICD-10-CM | POA: Diagnosis not present

## 2019-12-04 DIAGNOSIS — F411 Generalized anxiety disorder: Secondary | ICD-10-CM

## 2019-12-04 DIAGNOSIS — M79602 Pain in left arm: Secondary | ICD-10-CM | POA: Diagnosis not present

## 2019-12-05 ENCOUNTER — Ambulatory Visit (INDEPENDENT_AMBULATORY_CARE_PROVIDER_SITE_OTHER): Payer: Medicare HMO | Admitting: Family Medicine

## 2019-12-05 ENCOUNTER — Encounter: Payer: Self-pay | Admitting: Family Medicine

## 2019-12-05 ENCOUNTER — Other Ambulatory Visit: Payer: Self-pay

## 2019-12-05 ENCOUNTER — Ambulatory Visit: Payer: Medicare HMO | Admitting: Family Medicine

## 2019-12-05 VITALS — BP 152/76 | HR 84 | Wt 216.1 lb

## 2019-12-05 DIAGNOSIS — R2689 Other abnormalities of gait and mobility: Secondary | ICD-10-CM | POA: Diagnosis not present

## 2019-12-05 DIAGNOSIS — I10 Essential (primary) hypertension: Secondary | ICD-10-CM | POA: Diagnosis not present

## 2019-12-05 DIAGNOSIS — I471 Supraventricular tachycardia: Secondary | ICD-10-CM | POA: Diagnosis not present

## 2019-12-05 DIAGNOSIS — R519 Headache, unspecified: Secondary | ICD-10-CM | POA: Diagnosis not present

## 2019-12-05 DIAGNOSIS — J310 Chronic rhinitis: Secondary | ICD-10-CM | POA: Diagnosis not present

## 2019-12-05 DIAGNOSIS — H8112 Benign paroxysmal vertigo, left ear: Secondary | ICD-10-CM | POA: Diagnosis not present

## 2019-12-05 DIAGNOSIS — I456 Pre-excitation syndrome: Secondary | ICD-10-CM | POA: Diagnosis not present

## 2019-12-05 DIAGNOSIS — F418 Other specified anxiety disorders: Secondary | ICD-10-CM

## 2019-12-05 MED ORDER — MOMETASONE FUROATE 50 MCG/ACT NA SUSP: 1.0000 | Freq: Every day | NASAL | 12 refills | Status: DC

## 2019-12-05 NOTE — Assessment & Plan Note (Signed)
She reports that she actually went to her first therapy session with someone in [facility].  She actually feels like it went well she was able to do it virtually.  Hopefully she will continue with this.

## 2019-12-05 NOTE — Assessment & Plan Note (Signed)
We will switch back to Nasonex.
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
## 2019-12-05 NOTE — Assessment & Plan Note (Signed)
Oertli wearing a cardiac monitor.  Hopefully this will reveal some additional information to be helpful for the cardiologist.  And really want her to focus on taking the blood pressure medications consistently.  We discussed the risk of uncontrolled hypertension

## 2019-12-05 NOTE — Assessment & Plan Note (Addendum)
Discussed working on taking clonidine regularly.  Even if it is a half a tab.  Again encouraged her to keep a blood pressure log and if the blood pressure is elevated to not check it repetitively.  She is concerned because she feels like every time she tried to add another medication to her metoprolol that it seems to increase her blood pressure but I just reminded her that even on medication management patient consists can still have spikes in the blood pressure but is there so many factors that play into affecting that number.

## 2019-12-08 ENCOUNTER — Other Ambulatory Visit: Payer: Self-pay | Admitting: *Deleted

## 2019-12-08 DIAGNOSIS — J0100 Acute maxillary sinusitis, unspecified: Secondary | ICD-10-CM

## 2019-12-08 MED ORDER — AZELASTINE-FLUTICASONE 137-50 MCG/ACT NA SUSP: NASAL | 5 refills | Status: DC

## 2019-12-08 NOTE — Progress Notes (Signed)
Request came in from pharmacy to change the Mometasone 50 mcg nasal spray to the preferred alternative Flunisolide,Azelastine hcl. rx sent.

## 2019-12-09 ENCOUNTER — Telehealth: Payer: Self-pay | Admitting: Family Medicine

## 2019-12-09 NOTE — Telephone Encounter (Signed)
Received fax for PA on Azelastine Fluticasone sent through cover my meds waiting on determination. - CF

## 2019-12-09 NOTE — Telephone Encounter (Signed)
Received fax for PA on Mometasone Furoate sent through cover my meds and received authorization.   CaseId: [id number]  Valid: 08/15/19 - 08/13/20 - CF

## 2019-12-10 DIAGNOSIS — R000 Tachycardia, unspecified: Secondary | ICD-10-CM | POA: Diagnosis not present

## 2019-12-10 DIAGNOSIS — R9431 Abnormal electrocardiogram [ECG] [EKG]: Secondary | ICD-10-CM | POA: Diagnosis not present

## 2019-12-10 DIAGNOSIS — R112 Nausea with vomiting, unspecified: Secondary | ICD-10-CM | POA: Diagnosis not present

## 2019-12-10 DIAGNOSIS — R1012 Left upper quadrant pain: Secondary | ICD-10-CM | POA: Diagnosis not present

## 2019-12-10 DIAGNOSIS — F419 Anxiety disorder, unspecified: Secondary | ICD-10-CM | POA: Diagnosis not present

## 2019-12-10 DIAGNOSIS — R109 Unspecified abdominal pain: Secondary | ICD-10-CM | POA: Diagnosis not present

## 2019-12-10 DIAGNOSIS — R61 Generalized hyperhidrosis: Secondary | ICD-10-CM | POA: Diagnosis not present

## 2019-12-10 DIAGNOSIS — R1084 Generalized abdominal pain: Secondary | ICD-10-CM | POA: Diagnosis not present

## 2019-12-10 DIAGNOSIS — R1013 Epigastric pain: Secondary | ICD-10-CM | POA: Diagnosis not present

## 2019-12-10 DIAGNOSIS — R1011 Right upper quadrant pain: Secondary | ICD-10-CM | POA: Diagnosis not present

## 2019-12-10 DIAGNOSIS — R42 Dizziness and giddiness: Secondary | ICD-10-CM | POA: Diagnosis not present

## 2019-12-11 DIAGNOSIS — R1013 Epigastric pain: Secondary | ICD-10-CM | POA: Diagnosis not present

## 2019-12-11 DIAGNOSIS — R1011 Right upper quadrant pain: Secondary | ICD-10-CM | POA: Diagnosis not present

## 2019-12-11 DIAGNOSIS — F419 Anxiety disorder, unspecified: Secondary | ICD-10-CM | POA: Diagnosis not present

## 2019-12-11 DIAGNOSIS — R000 Tachycardia, unspecified: Secondary | ICD-10-CM | POA: Diagnosis not present

## 2019-12-11 DIAGNOSIS — R61 Generalized hyperhidrosis: Secondary | ICD-10-CM | POA: Diagnosis not present

## 2019-12-11 DIAGNOSIS — R42 Dizziness and giddiness: Secondary | ICD-10-CM | POA: Diagnosis not present

## 2019-12-11 DIAGNOSIS — R112 Nausea with vomiting, unspecified: Secondary | ICD-10-CM | POA: Diagnosis not present

## 2019-12-11 DIAGNOSIS — R1012 Left upper quadrant pain: Secondary | ICD-10-CM | POA: Diagnosis not present

## 2019-12-11 NOTE — Telephone Encounter (Signed)
Received fax from [facility] and they approved Azelastin Flutic from 12/10/2019 - 08/13/2020. - CF

## 2019-12-12 ENCOUNTER — Ambulatory Visit: Payer: Medicare HMO | Admitting: Family Medicine

## 2019-12-13 DIAGNOSIS — E876 Hypokalemia: Secondary | ICD-10-CM | POA: Diagnosis not present

## 2019-12-13 DIAGNOSIS — Z87891 Personal history of nicotine dependence: Secondary | ICD-10-CM | POA: Diagnosis not present

## 2019-12-13 DIAGNOSIS — Z8679 Personal history of other diseases of the circulatory system: Secondary | ICD-10-CM | POA: Diagnosis not present

## 2019-12-13 DIAGNOSIS — I10 Essential (primary) hypertension: Secondary | ICD-10-CM | POA: Diagnosis not present

## 2019-12-13 DIAGNOSIS — Z8639 Personal history of other endocrine, nutritional and metabolic disease: Secondary | ICD-10-CM | POA: Diagnosis not present

## 2019-12-13 DIAGNOSIS — R002 Palpitations: Secondary | ICD-10-CM | POA: Diagnosis not present

## 2019-12-13 DIAGNOSIS — R000 Tachycardia, unspecified: Secondary | ICD-10-CM | POA: Diagnosis not present

## 2019-12-13 DIAGNOSIS — R110 Nausea: Secondary | ICD-10-CM | POA: Diagnosis not present

## 2019-12-14 DIAGNOSIS — R000 Tachycardia, unspecified: Secondary | ICD-10-CM | POA: Diagnosis not present

## 2019-12-17 ENCOUNTER — Encounter: Payer: Self-pay | Admitting: Family Medicine

## 2019-12-17 ENCOUNTER — Telehealth (INDEPENDENT_AMBULATORY_CARE_PROVIDER_SITE_OTHER): Payer: Medicare HMO | Admitting: Family Medicine

## 2019-12-17 DIAGNOSIS — R531 Weakness: Secondary | ICD-10-CM | POA: Diagnosis not present

## 2019-12-17 DIAGNOSIS — F418 Other specified anxiety disorders: Secondary | ICD-10-CM | POA: Diagnosis not present

## 2019-12-17 DIAGNOSIS — F419 Anxiety disorder, unspecified: Secondary | ICD-10-CM | POA: Diagnosis not present

## 2019-12-17 DIAGNOSIS — E876 Hypokalemia: Secondary | ICD-10-CM | POA: Diagnosis not present

## 2019-12-17 DIAGNOSIS — R42 Dizziness and giddiness: Secondary | ICD-10-CM

## 2019-12-17 DIAGNOSIS — Z8542 Personal history of malignant neoplasm of other parts of uterus: Secondary | ICD-10-CM | POA: Diagnosis not present

## 2019-12-17 DIAGNOSIS — Z90710 Acquired absence of both cervix and uterus: Secondary | ICD-10-CM | POA: Diagnosis not present

## 2019-12-17 DIAGNOSIS — F430 Acute stress reaction: Secondary | ICD-10-CM

## 2019-12-17 DIAGNOSIS — G4733 Obstructive sleep apnea (adult) (pediatric): Secondary | ICD-10-CM | POA: Diagnosis not present

## 2019-12-17 DIAGNOSIS — I160 Hypertensive urgency: Secondary | ICD-10-CM | POA: Diagnosis not present

## 2019-12-17 DIAGNOSIS — I10 Essential (primary) hypertension: Secondary | ICD-10-CM | POA: Diagnosis not present

## 2019-12-17 DIAGNOSIS — G35 Multiple sclerosis: Secondary | ICD-10-CM | POA: Diagnosis not present

## 2019-12-17 DIAGNOSIS — R29898 Other symptoms and signs involving the musculoskeletal system: Secondary | ICD-10-CM | POA: Diagnosis not present

## 2019-12-17 DIAGNOSIS — Z8679 Personal history of other diseases of the circulatory system: Secondary | ICD-10-CM | POA: Diagnosis not present

## 2019-12-17 DIAGNOSIS — H532 Diplopia: Secondary | ICD-10-CM | POA: Diagnosis not present

## 2019-12-17 DIAGNOSIS — R519 Headache, unspecified: Secondary | ICD-10-CM | POA: Diagnosis not present

## 2019-12-17 DIAGNOSIS — R079 Chest pain, unspecified: Secondary | ICD-10-CM | POA: Diagnosis not present

## 2019-12-17 DIAGNOSIS — R29810 Facial weakness: Secondary | ICD-10-CM | POA: Diagnosis not present

## 2019-12-17 DIAGNOSIS — Z20822 Contact with and (suspected) exposure to covid-19: Secondary | ICD-10-CM | POA: Diagnosis not present

## 2019-12-17 DIAGNOSIS — Z6841 Body Mass Index (BMI) 40.0 and over, adult: Secondary | ICD-10-CM | POA: Diagnosis not present

## 2019-12-17 DIAGNOSIS — C541 Malignant neoplasm of endometrium: Secondary | ICD-10-CM | POA: Diagnosis not present

## 2019-12-17 DIAGNOSIS — D320 Benign neoplasm of cerebral meninges: Secondary | ICD-10-CM | POA: Diagnosis not present

## 2019-12-17 MED ORDER — AMLODIPINE BESYLATE 2.5 MG PO TABS: 2.5000 mg | ORAL_TABLET | Freq: Every day | ORAL | 1 refills | Status: DC

## 2019-12-17 NOTE — Assessment & Plan Note (Signed)
She is not using the clonidine patch and she is willing to retry amlodipine.  New rx sent. F/U in 2 weeks.

## 2019-12-17 NOTE — Assessment & Plan Note (Signed)
Encouraged her to reach back out and reschedule therapy appt.

## 2019-12-17 NOTE — Assessment & Plan Note (Signed)
Try to go to the lab to recheck potassium before the weekend.

## 2019-12-17 NOTE — Assessment & Plan Note (Signed)
Recommend try the home exercises.

## 2019-12-17 NOTE — Progress Notes (Signed)
Virtual Visit via Video Note  I connected with Jennifer [name] on 12/17/19 at  8:10 AM EDT by a video enabled telemedicine application and verified that I am speaking with the correct person using two identifiers.   I discussed the limitations of evaluation and management by telemedicine and the availability of in person appointments. The patient expressed understanding and agreed to proceed.  Subjective:    CC:   HPI: She is not feeling well today at all. She feels weak today.    F/U HTN - Says her BP has still been high.  She would like to retry the amlodipine. Given a few tabs in the ED and took a couple of times.   Still having back problems.  Says tried to do some cleaning, etc and it really aggravated her back.  Has to reschedule with ortho to discuss her MRI results and plan for her back.   Having frequent HA and having vertigo. Went to the ED and given meclizine.  Pain around the left side of face and eye and feel like eye is being pulled out. Couple of weeks HA have been more frequent.  Having double vision.    Her heart has been having palpitations. Had low potassium again and given oral potassium and took today. Due tor recheck potassium   she has been under a lot of stress. Husband had interlock device placed on their car and that has stress her. They share a car and this has been really stressful for her.    Allergic rhinitis - Using the combo azelastin-fluticasone.  She says is  Helping some but wanting to change to Nasonex.    Had to cancel an appt with her therapist bc she was having vertigo.   Past medical history, Surgical history, Family history not pertinant except as noted below, Social history, Allergies, and medications have been entered into the medical record, reviewed, and corrections made.   Review of Systems: No fevers, chills, night sweats, weight loss, chest pain, or shortness of breath.   Objective:    General: Speaking clearly in complete sentences  without any shortness of breath.  Alert and oriented x3.  Normal judgment. No apparent acute distress.    Impression and Recommendations:    Vertigo Recommend try the home exercises.   Depression with anxiety Encouraged her to reach back out and reschedule therapy appt.   Hypokalemia Try to go to the lab to recheck potassium before the weekend.   Hypertension with intolerance to multiple antihypertensive drugs She is not using the clonidine patch and she is willing to retry amlodipine.  New rx sent. F/U in 2 weeks.   Hypokalemia- due to recheck her potassium since low in the ED.  She did take her supplement. .    Vertigo - has pt appt coming up. Recommend doing home exercises.  OK to use meclizine though she finds it very sedating.  Acute stress - feeling very angry now for her husband consequences of his drinking.     Headaches - discussed possibly using amitriptyline. She will think about it.   Time spent in encounter 45 minutes  I discussed the assessment and treatment plan with the patient. The patient was provided an opportunity to ask questions and all were answered. The patient agreed with the plan and demonstrated an understanding of the instructions.   The patient was advised to call back or seek an in-person evaluation if the symptoms worsen or if the condition fails to improve as  anticipated.   Beatrice Lecher, MD

## 2019-12-18 DIAGNOSIS — R531 Weakness: Secondary | ICD-10-CM | POA: Diagnosis not present

## 2019-12-18 DIAGNOSIS — R2689 Other abnormalities of gait and mobility: Secondary | ICD-10-CM | POA: Diagnosis not present

## 2019-12-18 DIAGNOSIS — H532 Diplopia: Secondary | ICD-10-CM | POA: Diagnosis not present

## 2019-12-18 DIAGNOSIS — R200 Anesthesia of skin: Secondary | ICD-10-CM | POA: Diagnosis not present

## 2019-12-18 DIAGNOSIS — R42 Dizziness and giddiness: Secondary | ICD-10-CM | POA: Diagnosis not present

## 2019-12-18 DIAGNOSIS — R519 Headache, unspecified: Secondary | ICD-10-CM | POA: Diagnosis not present

## 2019-12-19 ENCOUNTER — Telehealth: Payer: Self-pay | Admitting: Family Medicine

## 2019-12-19 ENCOUNTER — Ambulatory Visit: Payer: Medicare HMO | Admitting: Psychologist

## 2019-12-19 ENCOUNTER — Ambulatory Visit: Payer: Medicare HMO | Admitting: Family Medicine

## 2019-12-19 DIAGNOSIS — I10 Essential (primary) hypertension: Secondary | ICD-10-CM | POA: Diagnosis not present

## 2019-12-19 DIAGNOSIS — N9089 Other specified noninflammatory disorders of vulva and perineum: Secondary | ICD-10-CM | POA: Diagnosis not present

## 2019-12-19 DIAGNOSIS — R0789 Other chest pain: Secondary | ICD-10-CM | POA: Diagnosis not present

## 2019-12-19 DIAGNOSIS — R0602 Shortness of breath: Secondary | ICD-10-CM | POA: Diagnosis not present

## 2019-12-19 DIAGNOSIS — R000 Tachycardia, unspecified: Secondary | ICD-10-CM | POA: Diagnosis not present

## 2019-12-19 DIAGNOSIS — E876 Hypokalemia: Secondary | ICD-10-CM | POA: Diagnosis not present

## 2019-12-19 DIAGNOSIS — C519 Malignant neoplasm of vulva, unspecified: Secondary | ICD-10-CM | POA: Diagnosis not present

## 2019-12-19 DIAGNOSIS — Z87891 Personal history of nicotine dependence: Secondary | ICD-10-CM | POA: Diagnosis not present

## 2019-12-19 DIAGNOSIS — Z79899 Other long term (current) drug therapy: Secondary | ICD-10-CM | POA: Diagnosis not present

## 2019-12-19 DIAGNOSIS — R079 Chest pain, unspecified: Secondary | ICD-10-CM | POA: Diagnosis not present

## 2019-12-19 DIAGNOSIS — N898 Other specified noninflammatory disorders of vagina: Secondary | ICD-10-CM | POA: Diagnosis not present

## 2019-12-19 DIAGNOSIS — R002 Palpitations: Secondary | ICD-10-CM | POA: Diagnosis not present

## 2019-12-19 DIAGNOSIS — Z8544 Personal history of malignant neoplasm of other female genital organs: Secondary | ICD-10-CM | POA: Diagnosis not present

## 2019-12-19 DIAGNOSIS — I472 Ventricular tachycardia: Secondary | ICD-10-CM | POA: Diagnosis not present

## 2019-12-19 NOTE — Telephone Encounter (Signed)
Patient states she only wants to talk to PCP. Offered triage and patient declined. States she only needs 5 mins of providers time. I let patient know I would send a message but I can not guarantee a call back that provider is with patients in clinic.

## 2019-12-20 DIAGNOSIS — R002 Palpitations: Secondary | ICD-10-CM | POA: Diagnosis not present

## 2019-12-20 DIAGNOSIS — I10 Essential (primary) hypertension: Secondary | ICD-10-CM | POA: Diagnosis not present

## 2019-12-20 DIAGNOSIS — R000 Tachycardia, unspecified: Secondary | ICD-10-CM | POA: Diagnosis not present

## 2019-12-20 DIAGNOSIS — I471 Supraventricular tachycardia: Secondary | ICD-10-CM | POA: Diagnosis not present

## 2019-12-20 DIAGNOSIS — R079 Chest pain, unspecified: Secondary | ICD-10-CM | POA: Diagnosis not present

## 2019-12-20 DIAGNOSIS — R42 Dizziness and giddiness: Secondary | ICD-10-CM | POA: Diagnosis not present

## 2019-12-20 MED ORDER — METOPROLOL TARTRATE 25 MG PO TABS
25.00 | ORAL_TABLET | ORAL | Status: DC
Start: 2019-12-20 — End: 2019-12-20

## 2019-12-20 MED ORDER — ONDANSETRON HCL 4 MG/2ML IJ SOLN
4.00 | INTRAMUSCULAR | Status: DC
Start: ? — End: 2019-12-20

## 2019-12-20 MED ORDER — HEPARIN SODIUM (PORCINE) 5000 UNIT/ML IJ SOLN
5000.00 | INTRAMUSCULAR | Status: DC
Start: 2019-12-20 — End: 2019-12-20

## 2019-12-20 MED ORDER — ACETAMINOPHEN 325 MG PO TABS
650.00 | ORAL_TABLET | ORAL | Status: DC
Start: ? — End: 2019-12-20

## 2019-12-20 MED ORDER — HYDRALAZINE HCL 10 MG PO TABS
10.00 | ORAL_TABLET | ORAL | Status: DC
Start: ? — End: 2019-12-20

## 2019-12-20 MED ORDER — MELATONIN 3 MG PO TABS
3.00 | ORAL_TABLET | ORAL | Status: DC
Start: ? — End: 2019-12-20

## 2019-12-20 MED ORDER — ALBUTEROL SULFATE (2.5 MG/3ML) 0.083% IN NEBU
2.50 | INHALATION_SOLUTION | RESPIRATORY_TRACT | Status: DC
Start: ? — End: 2019-12-20

## 2019-12-20 MED ORDER — MECLIZINE HCL 25 MG PO TABS
25.00 | ORAL_TABLET | ORAL | Status: DC
Start: ? — End: 2019-12-20

## 2019-12-20 MED ORDER — POTASSIUM CHLORIDE 20 MEQ PO PACK
20.00 | PACK | ORAL | Status: DC
Start: 2019-12-20 — End: 2019-12-20

## 2019-12-20 MED ORDER — POLYVINYL ALCOHOL 1.4 % OP SOLN
1.00 | OPHTHALMIC | Status: DC
Start: ? — End: 2019-12-20

## 2019-12-20 MED ORDER — AMLODIPINE BESYLATE 2.5 MG PO TABS
2.50 | ORAL_TABLET | ORAL | Status: DC
Start: 2019-12-21 — End: 2019-12-20

## 2019-12-21 DIAGNOSIS — I517 Cardiomegaly: Secondary | ICD-10-CM | POA: Diagnosis not present

## 2019-12-21 DIAGNOSIS — R000 Tachycardia, unspecified: Secondary | ICD-10-CM | POA: Diagnosis not present

## 2019-12-22 NOTE — Telephone Encounter (Signed)
Pt advised to take the 1/2 tablet of norvasc, along with the metoprolol tonight and to write down her bp/p and how she feels. Try to hydrate as much as she can.   Advised that we will call her tomorrow with any recommendations.  FYI: she wanted to discuss getting vaccinated for COVID.

## 2019-12-22 NOTE — Telephone Encounter (Signed)
We can get her on the schedule f/u ED f/u this week.

## 2019-12-22 NOTE — Telephone Encounter (Signed)
Patient called raising her voice and very mad that no one returned her call on Friday when she called at 4:21 demanding to only speak with provider.  Patient was mad that she went to hospital and was there overnight. Refused to tell me any details and refused to speak to me further. Patient insisted she can ONLY speak with [place] or Dr Madilyn Fireman and asked that I go interrupt clinic for them to take her call. I advised patient that was NOT appropriate and she became even more angry, yelling into the phone.   Patient states she needs a call back TODAY from only [place].

## 2019-12-22 NOTE — Telephone Encounter (Signed)
She reports that she has been in the hospital overnight. She wanted to know if Dr. Madilyn Fireman is ok if she can cut the norvasc in half. She would like to take a half tablet in the morning and the other half in the afternoon or evening if it doesn't cause her bp to be elevated. She said that while she was in the hospital she was given the 2.5 mg and it dropped her pressures down really low and made her feel bad.    She also would like Dr. Madilyn Fireman to make a referral for her to do PT at home because she is unable to drive.   She has an appt with cardiology on 5/17. That caught some atrial tachycardia on the monitor. She stated that they will be do some additional work up for this.   She informed me that her granddaughter had been running a temperature and wasn't feeling well and she had been around some other kids who did test positive and one of the kids grandfather passed away unsure if due to [place]. She said that she was tested for COVID via a rapid test which was negative but she still hasn't felt well and was wanting to get tested by PCR. She hasn't been feeling well but has been exposed.   She has a f/u appt on Friday

## 2019-12-24 ENCOUNTER — Encounter: Payer: Self-pay | Admitting: Family Medicine

## 2019-12-24 DIAGNOSIS — R05 Cough: Secondary | ICD-10-CM | POA: Diagnosis not present

## 2019-12-24 DIAGNOSIS — R0602 Shortness of breath: Secondary | ICD-10-CM | POA: Diagnosis not present

## 2019-12-24 DIAGNOSIS — Z20822 Contact with and (suspected) exposure to covid-19: Secondary | ICD-10-CM | POA: Diagnosis not present

## 2019-12-24 NOTE — Telephone Encounter (Signed)
Agree with below in regards to splitting Norvasc.  I cannot make another home PT referral for her we have already threaded tried 3 different agencies and they say that she does not qualify.  We had actually discussed this previously so if she is going to do PT it will have to be at a location near her home.  Thank you for updating [place] about the cardiology referral.  I also hope her granddaughter does okay.

## 2019-12-25 MED ORDER — CLONAZEPAM 0.5 MG PO TABS: 0.5000 mg | ORAL_TABLET | Freq: Two times a day (BID) | ORAL | 0 refills | Status: DC | PRN

## 2019-12-26 ENCOUNTER — Telehealth (INDEPENDENT_AMBULATORY_CARE_PROVIDER_SITE_OTHER): Payer: Medicare HMO | Admitting: Family Medicine

## 2019-12-26 ENCOUNTER — Encounter: Payer: Self-pay | Admitting: Family Medicine

## 2019-12-26 VITALS — BP 126/86 | HR 87

## 2019-12-26 DIAGNOSIS — I10 Essential (primary) hypertension: Secondary | ICD-10-CM | POA: Diagnosis not present

## 2019-12-26 DIAGNOSIS — I471 Supraventricular tachycardia: Secondary | ICD-10-CM

## 2019-12-26 DIAGNOSIS — C4499 Other specified malignant neoplasm of skin, unspecified: Secondary | ICD-10-CM | POA: Diagnosis not present

## 2019-12-26 DIAGNOSIS — F411 Generalized anxiety disorder: Secondary | ICD-10-CM | POA: Diagnosis not present

## 2019-12-26 DIAGNOSIS — R0602 Shortness of breath: Secondary | ICD-10-CM

## 2019-12-26 DIAGNOSIS — C519 Malignant neoplasm of vulva, unspecified: Secondary | ICD-10-CM

## 2019-12-26 NOTE — Assessment & Plan Note (Signed)
Has rescheduled therapy appt.  Has to cancel previously.  Did send over clonazepam to her pharmacy. She hasn't picked it up yet.

## 2019-12-26 NOTE — Assessment & Plan Note (Signed)
HOme BP actually looks good today.  Still encourage her to take her amlodipine daily.

## 2019-12-26 NOTE — Assessment & Plan Note (Signed)
Still having more flutters. F/U cards on Monday. Encouraged her not to skip her metoprolol.

## 2019-12-26 NOTE — Progress Notes (Signed)
Virtual Visit via Video Note  I connected with Jennifer [name] on 12/26/19 at  1:40 PM EDT by a video enabled telemedicine application and verified that I am speaking with the correct person using two identifiers.   I discussed the limitations of evaluation and management by telemedicine and the availability of in person appointments. The patient expressed understanding and agreed to proceed.  Subjective:    CC: f/u  HPI: Was exposed to [place] via a family member. Went to the ED 2 days ago for SOB worried she may have COVID.  Says she feels like her Chest is "bothering" her. still feels some SOB.  Tested negative x 2.   She also found out her Pagets is back.  She appt with Oncology the 25th.   Heart palps - sees Cardiology on Monday. Did hold her metoprolol but did take it today.  Still feeling some flutters at home. Says feels she get puffy when she takes the norvasc so hasn't been taking it consistantly. Still feeling disoriented and dizzy.    Holding off on PT bc of cough. Wants to make sure doesn't have COVID.     Past medical history, Surgical history, Family history not pertinant except as noted below, Social history, Allergies, and medications have been entered into the medical record, reviewed, and corrections made.   Review of Systems: No fevers, chills, night sweats, weight loss, chest pain, or shortness of breath.   Objective:    General: Speaking clearly in complete sentences without any shortness of breath.  Alert and oriented x3.  Normal judgment. No apparent acute distress.    Impression and Recommendations:    Hypertension with intolerance to multiple antihypertensive drugs HOme BP actually looks good today.  Still encourage her to take her amlodipine daily.   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 Has appt with Oncology coming up.    SVT (supraventricular tachycardia) ([place]) Still having more flutters. F/U cards on Monday. Encouraged her not to skip  her metoprolol.  GAD (generalized anxiety disorder) Has rescheduled therapy appt.  Has to cancel previously.  Did send over clonazepam to her pharmacy. She hasn't picked it up yet.      Answered her questions about the COVID vaccine. She is on the fence about getting it.     Time spent in encounter 40 minutes  I discussed the assessment and treatment plan with the patient. The patient was provided an opportunity to ask questions and all were answered. The patient agreed with the plan and demonstrated an understanding of the instructions.   The patient was advised to call back or seek an in-person evaluation if the symptoms worsen or if the condition fails to improve as anticipated.   Beatrice Lecher, MD

## 2019-12-26 NOTE — Assessment & Plan Note (Signed)
Unfortunately it is back. Has appt with Oncology coming up.

## 2019-12-29 DIAGNOSIS — I10 Essential (primary) hypertension: Secondary | ICD-10-CM | POA: Diagnosis not present

## 2019-12-29 DIAGNOSIS — R002 Palpitations: Secondary | ICD-10-CM | POA: Diagnosis not present

## 2019-12-29 DIAGNOSIS — E611 Iron deficiency: Secondary | ICD-10-CM | POA: Diagnosis not present

## 2019-12-29 DIAGNOSIS — E2609 Other primary hyperaldosteronism: Secondary | ICD-10-CM | POA: Diagnosis not present

## 2020-01-01 DIAGNOSIS — Z87891 Personal history of nicotine dependence: Secondary | ICD-10-CM | POA: Diagnosis not present

## 2020-01-01 DIAGNOSIS — R0602 Shortness of breath: Secondary | ICD-10-CM | POA: Diagnosis not present

## 2020-01-01 DIAGNOSIS — R079 Chest pain, unspecified: Secondary | ICD-10-CM | POA: Diagnosis not present

## 2020-01-01 DIAGNOSIS — R110 Nausea: Secondary | ICD-10-CM | POA: Diagnosis not present

## 2020-01-01 DIAGNOSIS — J029 Acute pharyngitis, unspecified: Secondary | ICD-10-CM | POA: Diagnosis not present

## 2020-01-02 ENCOUNTER — Other Ambulatory Visit: Payer: Self-pay

## 2020-01-02 ENCOUNTER — Ambulatory Visit (INDEPENDENT_AMBULATORY_CARE_PROVIDER_SITE_OTHER): Payer: Medicare HMO | Admitting: Psychologist

## 2020-01-02 ENCOUNTER — Encounter: Payer: Self-pay | Admitting: Family Medicine

## 2020-01-02 ENCOUNTER — Ambulatory Visit (INDEPENDENT_AMBULATORY_CARE_PROVIDER_SITE_OTHER): Payer: Medicare HMO | Admitting: Family Medicine

## 2020-01-02 VITALS — BP 148/83 | HR 84 | Ht 62.0 in | Wt 220.0 lb

## 2020-01-02 DIAGNOSIS — R000 Tachycardia, unspecified: Secondary | ICD-10-CM | POA: Diagnosis not present

## 2020-01-02 DIAGNOSIS — R42 Dizziness and giddiness: Secondary | ICD-10-CM | POA: Diagnosis not present

## 2020-01-02 DIAGNOSIS — F411 Generalized anxiety disorder: Secondary | ICD-10-CM

## 2020-01-02 DIAGNOSIS — F419 Anxiety disorder, unspecified: Secondary | ICD-10-CM | POA: Diagnosis not present

## 2020-01-02 DIAGNOSIS — R61 Generalized hyperhidrosis: Secondary | ICD-10-CM | POA: Diagnosis not present

## 2020-01-02 DIAGNOSIS — R0989 Other specified symptoms and signs involving the circulatory and respiratory systems: Secondary | ICD-10-CM | POA: Diagnosis not present

## 2020-01-02 DIAGNOSIS — R519 Headache, unspecified: Secondary | ICD-10-CM | POA: Diagnosis not present

## 2020-01-02 DIAGNOSIS — R221 Localized swelling, mass and lump, neck: Secondary | ICD-10-CM

## 2020-01-02 DIAGNOSIS — R7309 Other abnormal glucose: Secondary | ICD-10-CM

## 2020-01-02 DIAGNOSIS — F331 Major depressive disorder, recurrent, moderate: Secondary | ICD-10-CM | POA: Diagnosis not present

## 2020-01-02 DIAGNOSIS — R1013 Epigastric pain: Secondary | ICD-10-CM | POA: Diagnosis not present

## 2020-01-02 DIAGNOSIS — C4499 Other specified malignant neoplasm of skin, unspecified: Secondary | ICD-10-CM | POA: Diagnosis not present

## 2020-01-02 DIAGNOSIS — C519 Malignant neoplasm of vulva, unspecified: Secondary | ICD-10-CM

## 2020-01-02 DIAGNOSIS — R9431 Abnormal electrocardiogram [ECG] [EKG]: Secondary | ICD-10-CM | POA: Diagnosis not present

## 2020-01-02 DIAGNOSIS — E669 Obesity, unspecified: Secondary | ICD-10-CM | POA: Diagnosis not present

## 2020-01-02 DIAGNOSIS — J029 Acute pharyngitis, unspecified: Secondary | ICD-10-CM

## 2020-01-02 DIAGNOSIS — R002 Palpitations: Secondary | ICD-10-CM | POA: Diagnosis not present

## 2020-01-02 LAB — GLUCOSE, POCT (MANUAL RESULT ENTRY): POC Glucose: 130 mg/dl — AB (ref 70–99)

## 2020-01-02 NOTE — Assessment & Plan Note (Signed)
Has f/u in next week to discuss txoptoins.
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
## 2020-01-05 DIAGNOSIS — R079 Chest pain, unspecified: Secondary | ICD-10-CM | POA: Diagnosis not present

## 2020-01-05 DIAGNOSIS — R9431 Abnormal electrocardiogram [ECG] [EKG]: Secondary | ICD-10-CM | POA: Diagnosis not present

## 2020-01-05 DIAGNOSIS — R000 Tachycardia, unspecified: Secondary | ICD-10-CM | POA: Diagnosis not present

## 2020-01-05 DIAGNOSIS — R002 Palpitations: Secondary | ICD-10-CM | POA: Diagnosis not present

## 2020-01-06 DIAGNOSIS — N9089 Other specified noninflammatory disorders of vulva and perineum: Secondary | ICD-10-CM | POA: Diagnosis not present

## 2020-01-06 DIAGNOSIS — C519 Malignant neoplasm of vulva, unspecified: Secondary | ICD-10-CM | POA: Diagnosis not present

## 2020-01-06 DIAGNOSIS — R102 Pelvic and perineal pain: Secondary | ICD-10-CM | POA: Diagnosis not present

## 2020-01-06 DIAGNOSIS — Z8544 Personal history of malignant neoplasm of other female genital organs: Secondary | ICD-10-CM | POA: Diagnosis not present

## 2020-01-06 DIAGNOSIS — D280 Benign neoplasm of vulva: Secondary | ICD-10-CM | POA: Diagnosis not present

## 2020-01-06 DIAGNOSIS — R9431 Abnormal electrocardiogram [ECG] [EKG]: Secondary | ICD-10-CM | POA: Diagnosis not present

## 2020-01-09 ENCOUNTER — Other Ambulatory Visit: Payer: Self-pay

## 2020-01-09 ENCOUNTER — Emergency Department (HOSPITAL_BASED_OUTPATIENT_CLINIC_OR_DEPARTMENT_OTHER)
Admission: EM | Admit: 2020-01-09 | Discharge: 2020-01-09 | Disposition: A | Discharge status: Home / Self Care | Payer: Medicare HMO | Attending: Emergency Medicine | Admitting: Emergency Medicine

## 2020-01-09 ENCOUNTER — Encounter (HOSPITAL_BASED_OUTPATIENT_CLINIC_OR_DEPARTMENT_OTHER): Payer: Self-pay | Admitting: *Deleted

## 2020-01-09 DIAGNOSIS — K5900 Constipation, unspecified: Secondary | ICD-10-CM | POA: Diagnosis not present

## 2020-01-09 DIAGNOSIS — R109 Unspecified abdominal pain: Secondary | ICD-10-CM | POA: Diagnosis not present

## 2020-01-09 DIAGNOSIS — Z5321 Procedure and treatment not carried out due to patient leaving prior to being seen by health care provider: Secondary | ICD-10-CM | POA: Diagnosis not present

## 2020-01-09 DIAGNOSIS — R1030 Lower abdominal pain, unspecified: Secondary | ICD-10-CM | POA: Diagnosis not present

## 2020-01-09 DIAGNOSIS — R1032 Left lower quadrant pain: Secondary | ICD-10-CM | POA: Diagnosis not present

## 2020-01-09 DIAGNOSIS — R1031 Right lower quadrant pain: Secondary | ICD-10-CM | POA: Diagnosis not present

## 2020-01-09 NOTE — ED Triage Notes (Signed)
Lower abdominal pain.  

## 2020-01-09 NOTE — ED Notes (Signed)
Pt called from lobby no one answered.

## 2020-01-09 NOTE — ED Notes (Signed)
Pt called to recheck vitals in lobby no answer

## 2020-01-13 DIAGNOSIS — R0789 Other chest pain: Secondary | ICD-10-CM | POA: Diagnosis not present

## 2020-01-13 DIAGNOSIS — M542 Cervicalgia: Secondary | ICD-10-CM | POA: Diagnosis not present

## 2020-01-13 DIAGNOSIS — R0602 Shortness of breath: Secondary | ICD-10-CM | POA: Diagnosis not present

## 2020-01-13 DIAGNOSIS — R5383 Other fatigue: Secondary | ICD-10-CM | POA: Diagnosis not present

## 2020-01-13 DIAGNOSIS — R110 Nausea: Secondary | ICD-10-CM | POA: Diagnosis not present

## 2020-01-13 DIAGNOSIS — R1013 Epigastric pain: Secondary | ICD-10-CM | POA: Diagnosis not present

## 2020-01-13 DIAGNOSIS — R9431 Abnormal electrocardiogram [ECG] [EKG]: Secondary | ICD-10-CM | POA: Diagnosis not present

## 2020-01-13 DIAGNOSIS — R002 Palpitations: Secondary | ICD-10-CM | POA: Diagnosis not present

## 2020-01-13 DIAGNOSIS — R42 Dizziness and giddiness: Secondary | ICD-10-CM | POA: Diagnosis not present

## 2020-01-14 ENCOUNTER — Other Ambulatory Visit: Payer: Self-pay | Admitting: *Deleted

## 2020-01-14 NOTE — Patient Outreach (Signed)
Member screened for potential [facility] needs as a benefit of [employer] [facility].  Per Patient Jennifer [name] member has had numerous ED visits/hospitalizations. Most recent 01/13/20 at [facility].  Telephone call made to member at number on file [phone]. Person answered phone stated it was the wrong number.    Marthenia Rolling, MSN-Ed, RN,BSN [facility] Coordinator [phone] [facility]) [phone]  (Toll free office)

## 2020-01-15 DIAGNOSIS — G35 Multiple sclerosis: Secondary | ICD-10-CM | POA: Diagnosis not present

## 2020-01-15 DIAGNOSIS — Z79899 Other long term (current) drug therapy: Secondary | ICD-10-CM | POA: Diagnosis not present

## 2020-01-15 DIAGNOSIS — R1030 Lower abdominal pain, unspecified: Secondary | ICD-10-CM | POA: Diagnosis not present

## 2020-01-15 DIAGNOSIS — F419 Anxiety disorder, unspecified: Secondary | ICD-10-CM | POA: Diagnosis not present

## 2020-01-15 DIAGNOSIS — E785 Hyperlipidemia, unspecified: Secondary | ICD-10-CM | POA: Diagnosis not present

## 2020-01-15 DIAGNOSIS — R7303 Prediabetes: Secondary | ICD-10-CM | POA: Diagnosis not present

## 2020-01-15 DIAGNOSIS — R1084 Generalized abdominal pain: Secondary | ICD-10-CM | POA: Diagnosis not present

## 2020-01-15 DIAGNOSIS — K5900 Constipation, unspecified: Secondary | ICD-10-CM | POA: Diagnosis not present

## 2020-01-15 DIAGNOSIS — K5730 Diverticulosis of large intestine without perforation or abscess without bleeding: Secondary | ICD-10-CM | POA: Diagnosis not present

## 2020-01-15 DIAGNOSIS — I10 Essential (primary) hypertension: Secondary | ICD-10-CM | POA: Diagnosis not present

## 2020-01-15 DIAGNOSIS — J45909 Unspecified asthma, uncomplicated: Secondary | ICD-10-CM | POA: Diagnosis not present

## 2020-01-15 DIAGNOSIS — Z8542 Personal history of malignant neoplasm of other parts of uterus: Secondary | ICD-10-CM | POA: Diagnosis not present

## 2020-01-15 DIAGNOSIS — Z9049 Acquired absence of other specified parts of digestive tract: Secondary | ICD-10-CM | POA: Diagnosis not present

## 2020-01-16 ENCOUNTER — Encounter: Payer: Self-pay | Admitting: Family Medicine

## 2020-01-16 ENCOUNTER — Ambulatory Visit (INDEPENDENT_AMBULATORY_CARE_PROVIDER_SITE_OTHER): Payer: Medicare HMO | Admitting: Family Medicine

## 2020-01-16 VITALS — BP 155/76 | HR 83 | Ht 62.0 in | Wt 219.0 lb

## 2020-01-16 DIAGNOSIS — C210 Malignant neoplasm of anus, unspecified: Secondary | ICD-10-CM | POA: Diagnosis not present

## 2020-01-16 DIAGNOSIS — C519 Malignant neoplasm of vulva, unspecified: Secondary | ICD-10-CM

## 2020-01-16 DIAGNOSIS — C4499 Other specified malignant neoplasm of skin, unspecified: Secondary | ICD-10-CM | POA: Diagnosis not present

## 2020-01-16 DIAGNOSIS — R140 Abdominal distension (gaseous): Secondary | ICD-10-CM | POA: Diagnosis not present

## 2020-01-16 DIAGNOSIS — R300 Dysuria: Secondary | ICD-10-CM

## 2020-01-16 DIAGNOSIS — Z7189 Other specified counseling: Secondary | ICD-10-CM | POA: Diagnosis not present

## 2020-01-16 LAB — POCT URINALYSIS DIP (CLINITEK)
Blood, UA: NEGATIVE
Glucose, UA: NEGATIVE mg/dL
Ketones, POC UA: NEGATIVE mg/dL
Leukocytes, UA: NEGATIVE
Nitrite, UA: NEGATIVE
POC PROTEIN,UA: 30 — AB
Spec Grav, UA: >=1.03 — AB (ref 1.010–1.025)
Urobilinogen, UA: 0.2 E.U./dL
pH, UA: 5.5 (ref 5.0–8.0)

## 2020-01-16 NOTE — Assessment & Plan Note (Signed)
Recent biopsy on 5 7 confirms Paget's disease on the left labia.  She has follow-up with oncology in about a week to go over treatment options.
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
## 2020-01-17 LAB — URINE CULTURE
MICRO NUMBER:: 10554731
Result:: NO GROWTH
SPECIMEN QUALITY:: ADEQUATE

## 2020-01-19 ENCOUNTER — Encounter: Payer: Self-pay | Admitting: Family Medicine

## 2020-01-19 DIAGNOSIS — C4499 Other specified malignant neoplasm of skin, unspecified: Secondary | ICD-10-CM

## 2020-01-19 DIAGNOSIS — R102 Pelvic and perineal pain: Secondary | ICD-10-CM | POA: Diagnosis not present

## 2020-01-19 DIAGNOSIS — C210 Malignant neoplasm of anus, unspecified: Secondary | ICD-10-CM

## 2020-01-19 DIAGNOSIS — R1110 Vomiting, unspecified: Secondary | ICD-10-CM | POA: Diagnosis not present

## 2020-01-19 DIAGNOSIS — Z7189 Other specified counseling: Secondary | ICD-10-CM | POA: Insufficient documentation

## 2020-01-19 NOTE — Telephone Encounter (Signed)
Signed.

## 2020-01-19 NOTE — Telephone Encounter (Signed)
Referral pended

## 2020-01-20 ENCOUNTER — Ambulatory Visit: Payer: Medicare HMO | Admitting: Psychologist

## 2020-01-20 ENCOUNTER — Telehealth: Payer: Self-pay | Admitting: *Deleted

## 2020-01-20 DIAGNOSIS — D509 Iron deficiency anemia, unspecified: Secondary | ICD-10-CM

## 2020-01-20 DIAGNOSIS — Z884 Allergy status to anesthetic agent status: Secondary | ICD-10-CM | POA: Diagnosis not present

## 2020-01-20 DIAGNOSIS — Z888 Allergy status to other drugs, medicaments and biological substances status: Secondary | ICD-10-CM | POA: Diagnosis not present

## 2020-01-20 DIAGNOSIS — Z87891 Personal history of nicotine dependence: Secondary | ICD-10-CM | POA: Diagnosis not present

## 2020-01-20 DIAGNOSIS — Z882 Allergy status to sulfonamides status: Secondary | ICD-10-CM | POA: Diagnosis not present

## 2020-01-20 DIAGNOSIS — Z885 Allergy status to narcotic agent status: Secondary | ICD-10-CM | POA: Diagnosis not present

## 2020-01-20 DIAGNOSIS — C519 Malignant neoplasm of vulva, unspecified: Secondary | ICD-10-CM | POA: Diagnosis not present

## 2020-01-20 DIAGNOSIS — Z8542 Personal history of malignant neoplasm of other parts of uterus: Secondary | ICD-10-CM | POA: Diagnosis not present

## 2020-01-20 DIAGNOSIS — Z881 Allergy status to other antibiotic agents status: Secondary | ICD-10-CM | POA: Diagnosis not present

## 2020-01-20 DIAGNOSIS — Z886 Allergy status to analgesic agent status: Secondary | ICD-10-CM | POA: Diagnosis not present

## 2020-01-20 NOTE — Telephone Encounter (Signed)
Pt called inquiring about an order to be sent to [facility] for a TIBC and ferritin.   Will fwd to pcp for advice and if she is agreeable will fax.

## 2020-01-20 NOTE — Telephone Encounter (Signed)
Order placed

## 2020-01-21 DIAGNOSIS — K5900 Constipation, unspecified: Secondary | ICD-10-CM | POA: Diagnosis not present

## 2020-01-21 DIAGNOSIS — G35 Multiple sclerosis: Secondary | ICD-10-CM | POA: Diagnosis not present

## 2020-01-21 DIAGNOSIS — E785 Hyperlipidemia, unspecified: Secondary | ICD-10-CM | POA: Diagnosis not present

## 2020-01-21 DIAGNOSIS — E119 Type 2 diabetes mellitus without complications: Secondary | ICD-10-CM | POA: Diagnosis not present

## 2020-01-21 DIAGNOSIS — N281 Cyst of kidney, acquired: Secondary | ICD-10-CM | POA: Diagnosis not present

## 2020-01-21 DIAGNOSIS — I10 Essential (primary) hypertension: Secondary | ICD-10-CM | POA: Diagnosis not present

## 2020-01-21 DIAGNOSIS — R1084 Generalized abdominal pain: Secondary | ICD-10-CM | POA: Diagnosis not present

## 2020-01-21 DIAGNOSIS — R1031 Right lower quadrant pain: Secondary | ICD-10-CM | POA: Diagnosis not present

## 2020-01-21 DIAGNOSIS — Z87891 Personal history of nicotine dependence: Secondary | ICD-10-CM | POA: Diagnosis not present

## 2020-01-21 DIAGNOSIS — K5730 Diverticulosis of large intestine without perforation or abscess without bleeding: Secondary | ICD-10-CM | POA: Diagnosis not present

## 2020-01-21 DIAGNOSIS — Z9049 Acquired absence of other specified parts of digestive tract: Secondary | ICD-10-CM | POA: Diagnosis not present

## 2020-01-21 DIAGNOSIS — Z79899 Other long term (current) drug therapy: Secondary | ICD-10-CM | POA: Diagnosis not present

## 2020-01-21 DIAGNOSIS — Z8542 Personal history of malignant neoplasm of other parts of uterus: Secondary | ICD-10-CM | POA: Diagnosis not present

## 2020-01-21 DIAGNOSIS — R1032 Left lower quadrant pain: Secondary | ICD-10-CM | POA: Diagnosis not present

## 2020-01-22 DIAGNOSIS — R109 Unspecified abdominal pain: Secondary | ICD-10-CM | POA: Diagnosis not present

## 2020-01-22 DIAGNOSIS — E876 Hypokalemia: Secondary | ICD-10-CM | POA: Diagnosis not present

## 2020-01-22 DIAGNOSIS — D509 Iron deficiency anemia, unspecified: Secondary | ICD-10-CM | POA: Diagnosis not present

## 2020-01-22 DIAGNOSIS — R194 Change in bowel habit: Secondary | ICD-10-CM | POA: Diagnosis not present

## 2020-01-22 DIAGNOSIS — K5900 Constipation, unspecified: Secondary | ICD-10-CM | POA: Diagnosis not present

## 2020-01-22 LAB — COMPREHENSIVE METABOLIC PANEL: Calcium: 8.8 (ref 8.7–10.7)

## 2020-01-22 LAB — BASIC METABOLIC PANEL
BUN: 11 (ref 4–21)
CO2: 24 — AB (ref 13–22)
Chloride: 109 — AB (ref 99–108)
Creatinine: 0.7 (ref 0.5–1.1)
Glucose: 134
Potassium: 3.7 (ref 3.4–5.3)
Sodium: 141 (ref 137–147)

## 2020-01-23 ENCOUNTER — Telehealth: Payer: Self-pay | Admitting: *Deleted

## 2020-01-23 NOTE — Telephone Encounter (Signed)
Called and left the patient a message to call the office back to schedule an appt

## 2020-01-24 DIAGNOSIS — K5900 Constipation, unspecified: Secondary | ICD-10-CM | POA: Diagnosis not present

## 2020-01-24 DIAGNOSIS — Z87891 Personal history of nicotine dependence: Secondary | ICD-10-CM | POA: Diagnosis not present

## 2020-01-24 DIAGNOSIS — R1110 Vomiting, unspecified: Secondary | ICD-10-CM | POA: Diagnosis not present

## 2020-01-24 DIAGNOSIS — R109 Unspecified abdominal pain: Secondary | ICD-10-CM | POA: Diagnosis not present

## 2020-01-24 DIAGNOSIS — R0602 Shortness of breath: Secondary | ICD-10-CM | POA: Diagnosis not present

## 2020-01-24 DIAGNOSIS — R140 Abdominal distension (gaseous): Secondary | ICD-10-CM | POA: Diagnosis not present

## 2020-01-24 DIAGNOSIS — J984 Other disorders of lung: Secondary | ICD-10-CM | POA: Diagnosis not present

## 2020-01-24 DIAGNOSIS — J029 Acute pharyngitis, unspecified: Secondary | ICD-10-CM | POA: Diagnosis not present

## 2020-01-26 ENCOUNTER — Telehealth: Payer: Self-pay | Admitting: Family Medicine

## 2020-01-26 ENCOUNTER — Encounter: Payer: Self-pay | Admitting: Family Medicine

## 2020-01-26 DIAGNOSIS — E876 Hypokalemia: Secondary | ICD-10-CM | POA: Diagnosis not present

## 2020-01-26 DIAGNOSIS — R002 Palpitations: Secondary | ICD-10-CM | POA: Diagnosis not present

## 2020-01-26 DIAGNOSIS — R9431 Abnormal electrocardiogram [ECG] [EKG]: Secondary | ICD-10-CM | POA: Diagnosis not present

## 2020-01-26 NOTE — Telephone Encounter (Signed)
Called the patient and scheduled her for a new patient appt on 6/25 with Dr Berline Lopes. Patient asked for a call be returned to her from [place] [facility]

## 2020-01-26 NOTE — Telephone Encounter (Signed)
MyChart note sent

## 2020-01-28 DIAGNOSIS — R9431 Abnormal electrocardiogram [ECG] [EKG]: Secondary | ICD-10-CM | POA: Diagnosis not present

## 2020-01-28 DIAGNOSIS — R000 Tachycardia, unspecified: Secondary | ICD-10-CM | POA: Diagnosis not present

## 2020-01-28 DIAGNOSIS — R079 Chest pain, unspecified: Secondary | ICD-10-CM | POA: Diagnosis not present

## 2020-01-28 DIAGNOSIS — R0602 Shortness of breath: Secondary | ICD-10-CM | POA: Diagnosis not present

## 2020-01-28 DIAGNOSIS — R0789 Other chest pain: Secondary | ICD-10-CM | POA: Diagnosis not present

## 2020-01-29 DIAGNOSIS — R079 Chest pain, unspecified: Secondary | ICD-10-CM | POA: Diagnosis not present

## 2020-01-29 DIAGNOSIS — R000 Tachycardia, unspecified: Secondary | ICD-10-CM | POA: Diagnosis not present

## 2020-01-30 ENCOUNTER — Emergency Department (HOSPITAL_BASED_OUTPATIENT_CLINIC_OR_DEPARTMENT_OTHER): Payer: Medicare HMO

## 2020-01-30 ENCOUNTER — Encounter (HOSPITAL_BASED_OUTPATIENT_CLINIC_OR_DEPARTMENT_OTHER): Payer: Self-pay | Admitting: Emergency Medicine

## 2020-01-30 ENCOUNTER — Other Ambulatory Visit: Payer: Self-pay

## 2020-01-30 ENCOUNTER — Emergency Department (HOSPITAL_BASED_OUTPATIENT_CLINIC_OR_DEPARTMENT_OTHER)
Admission: EM | Admit: 2020-01-30 | Discharge: 2020-01-30 | Disposition: A | Discharge status: Home / Self Care | Payer: Medicare HMO | Attending: Emergency Medicine | Admitting: Emergency Medicine

## 2020-01-30 ENCOUNTER — Encounter: Payer: Self-pay | Admitting: Family Medicine

## 2020-01-30 ENCOUNTER — Ambulatory Visit (INDEPENDENT_AMBULATORY_CARE_PROVIDER_SITE_OTHER): Payer: Medicare HMO | Admitting: Family Medicine

## 2020-01-30 VITALS — BP 127/69 | HR 107 | Ht 62.0 in | Wt 220.0 lb

## 2020-01-30 DIAGNOSIS — Z87891 Personal history of nicotine dependence: Secondary | ICD-10-CM | POA: Insufficient documentation

## 2020-01-30 DIAGNOSIS — Z79899 Other long term (current) drug therapy: Secondary | ICD-10-CM | POA: Insufficient documentation

## 2020-01-30 DIAGNOSIS — Z13220 Encounter for screening for lipoid disorders: Secondary | ICD-10-CM

## 2020-01-30 DIAGNOSIS — Z882 Allergy status to sulfonamides status: Secondary | ICD-10-CM | POA: Diagnosis not present

## 2020-01-30 DIAGNOSIS — M7989 Other specified soft tissue disorders: Secondary | ICD-10-CM | POA: Diagnosis not present

## 2020-01-30 DIAGNOSIS — E876 Hypokalemia: Secondary | ICD-10-CM | POA: Diagnosis not present

## 2020-01-30 DIAGNOSIS — I10 Essential (primary) hypertension: Secondary | ICD-10-CM | POA: Diagnosis not present

## 2020-01-30 DIAGNOSIS — M79602 Pain in left arm: Secondary | ICD-10-CM | POA: Diagnosis not present

## 2020-01-30 DIAGNOSIS — N816 Rectocele: Secondary | ICD-10-CM | POA: Diagnosis not present

## 2020-01-30 DIAGNOSIS — R112 Nausea with vomiting, unspecified: Secondary | ICD-10-CM | POA: Diagnosis not present

## 2020-01-30 DIAGNOSIS — J45909 Unspecified asthma, uncomplicated: Secondary | ICD-10-CM | POA: Insufficient documentation

## 2020-01-30 DIAGNOSIS — Z881 Allergy status to other antibiotic agents status: Secondary | ICD-10-CM | POA: Diagnosis not present

## 2020-01-30 DIAGNOSIS — R0789 Other chest pain: Secondary | ICD-10-CM | POA: Diagnosis not present

## 2020-01-30 DIAGNOSIS — R21 Rash and other nonspecific skin eruption: Secondary | ICD-10-CM

## 2020-01-30 DIAGNOSIS — Z885 Allergy status to narcotic agent status: Secondary | ICD-10-CM | POA: Diagnosis not present

## 2020-01-30 DIAGNOSIS — R079 Chest pain, unspecified: Secondary | ICD-10-CM | POA: Diagnosis not present

## 2020-01-30 DIAGNOSIS — L309 Dermatitis, unspecified: Secondary | ICD-10-CM | POA: Diagnosis not present

## 2020-01-30 DIAGNOSIS — L219 Seborrheic dermatitis, unspecified: Secondary | ICD-10-CM | POA: Diagnosis not present

## 2020-01-30 DIAGNOSIS — R0981 Nasal congestion: Secondary | ICD-10-CM | POA: Diagnosis not present

## 2020-01-30 DIAGNOSIS — R05 Cough: Secondary | ICD-10-CM | POA: Diagnosis not present

## 2020-01-30 DIAGNOSIS — Z20822 Contact with and (suspected) exposure to covid-19: Secondary | ICD-10-CM | POA: Diagnosis not present

## 2020-01-30 LAB — BASIC METABOLIC PANEL
Anion gap: 10 (ref 5–15)
BUN: 17 mg/dL (ref 6–20)
CO2: 27 mmol/L (ref 22–32)
Calcium: 8.3 mg/dL — ABNORMAL LOW (ref 8.9–10.3)
Chloride: 102 mmol/L (ref 98–111)
Creatinine, Ser: 0.65 mg/dL (ref 0.44–1.00)
GFR calc Af Amer: >60 mL/min (ref >60)
GFR calc non Af Amer: >60 mL/min (ref >60)
Glucose, Bld: 97 mg/dL (ref 70–99)
Potassium: 3.5 mmol/L (ref 3.5–5.1)
Sodium: 139 mmol/L (ref 135–145)

## 2020-01-30 MED ORDER — CLOBETASOL PROPIONATE 0.05 % EX CREA: 1.0000 "application " | TOPICAL_CREAM | Freq: Two times a day (BID) | CUTANEOUS | 2 refills | Status: DC | PRN

## 2020-01-30 MED ORDER — POTASSIUM CHLORIDE 40 MEQ/15ML (20%) PO SOLN: 15.0000 mL | Freq: Every day | ORAL | 1 refills | Status: DC | PRN

## 2020-01-30 MED ORDER — METRONIDAZOLE 1 % EX GEL: Freq: Every day | CUTANEOUS | 0 refills | Status: DC

## 2020-01-30 MED ORDER — PREDNISONE 2.5 MG PO TABS: ORAL_TABLET | ORAL | 0 refills | Status: DC

## 2020-01-30 NOTE — ED Triage Notes (Signed)
Left arm pain just above elbow.  Sts she has a knot that is tender to touch. Sts she gets labs drawn every week and wonders if that is causing this pain.  Pt also wants her K+ checked again since it runs low so much.

## 2020-01-30 NOTE — Discharge Instructions (Addendum)
Your ultrasound did not show any signs of a blood clot in your arm.  You can try taking Tylenol for pain if you are not allergic to it.  You can also try warm compresses.  Talk to your doctor about your potassium results for further treatment guidance. Today it is 3.5.  Your EKG did not show any signs of a heart attack.  Recommend you follow-up with your primary care doctor at your appointment scheduled later today.

## 2020-01-30 NOTE — ED Notes (Signed)
ED Provider at bedside. 

## 2020-01-30 NOTE — Progress Notes (Signed)
Pt asked about a referral for GI
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
## 2020-02-02 ENCOUNTER — Telehealth: Payer: Self-pay | Admitting: *Deleted

## 2020-02-02 DIAGNOSIS — E876 Hypokalemia: Secondary | ICD-10-CM | POA: Diagnosis not present

## 2020-02-02 DIAGNOSIS — R079 Chest pain, unspecified: Secondary | ICD-10-CM | POA: Diagnosis not present

## 2020-02-02 LAB — BASIC METABOLIC PANEL
BUN: 19 (ref 4–21)
CO2: 27 — AB (ref 13–22)
Chloride: 108 (ref 99–108)
Creatinine: 0.7 (ref 0.5–1.1)
Glucose: 158
Potassium: 3.4 (ref 3.4–5.3)
Sodium: 141 (ref 137–147)

## 2020-02-02 LAB — COMPREHENSIVE METABOLIC PANEL: Calcium: 8.8 (ref 8.7–10.7)

## 2020-02-02 NOTE — Telephone Encounter (Signed)
Attempted to return the patient's call, left a message to call the office back

## 2020-02-03 ENCOUNTER — Telehealth: Payer: Self-pay

## 2020-02-03 DIAGNOSIS — R079 Chest pain, unspecified: Secondary | ICD-10-CM | POA: Diagnosis not present

## 2020-02-03 DIAGNOSIS — S2231XA Fracture of one rib, right side, initial encounter for closed fracture: Secondary | ICD-10-CM | POA: Diagnosis not present

## 2020-02-03 DIAGNOSIS — I498 Other specified cardiac arrhythmias: Secondary | ICD-10-CM | POA: Diagnosis not present

## 2020-02-03 DIAGNOSIS — J069 Acute upper respiratory infection, unspecified: Secondary | ICD-10-CM | POA: Diagnosis not present

## 2020-02-03 DIAGNOSIS — R9431 Abnormal electrocardiogram [ECG] [EKG]: Secondary | ICD-10-CM | POA: Diagnosis not present

## 2020-02-03 DIAGNOSIS — J90 Pleural effusion, not elsewhere classified: Secondary | ICD-10-CM | POA: Diagnosis not present

## 2020-02-03 DIAGNOSIS — H9211 Otorrhea, right ear: Secondary | ICD-10-CM | POA: Diagnosis not present

## 2020-02-03 DIAGNOSIS — B9789 Other viral agents as the cause of diseases classified elsewhere: Secondary | ICD-10-CM | POA: Diagnosis not present

## 2020-02-03 DIAGNOSIS — R05 Cough: Secondary | ICD-10-CM | POA: Diagnosis not present

## 2020-02-03 DIAGNOSIS — J029 Acute pharyngitis, unspecified: Secondary | ICD-10-CM | POA: Diagnosis not present

## 2020-02-03 DIAGNOSIS — R5383 Other fatigue: Secondary | ICD-10-CM | POA: Diagnosis not present

## 2020-02-03 DIAGNOSIS — R0602 Shortness of breath: Secondary | ICD-10-CM | POA: Diagnosis not present

## 2020-02-03 NOTE — Telephone Encounter (Signed)
Kitrina states she is having eye pain, sore throat, congestion and pressure in face. She has been taking an expectorant with little relief. She would like the next steps. She wanted to come in tomorrow. I advised I would send a message. Please advise.

## 2020-02-04 ENCOUNTER — Encounter: Payer: Self-pay | Admitting: Family Medicine

## 2020-02-04 DIAGNOSIS — R9431 Abnormal electrocardiogram [ECG] [EKG]: Secondary | ICD-10-CM | POA: Diagnosis not present

## 2020-02-04 NOTE — Assessment & Plan Note (Signed)
We will try switching to liquid potassium supplement.  Encouraged her to take it daily for the next 5 or 6 days we can always recheck her level again in 1 to 2 weeks.

## 2020-02-04 NOTE — Telephone Encounter (Signed)
Okay to see if she still needs to be seen.  I could always come in a little bit early in the morning.

## 2020-02-05 NOTE — Telephone Encounter (Signed)
Left message for a return call

## 2020-02-06 ENCOUNTER — Ambulatory Visit: Payer: Medicare HMO | Admitting: Gynecologic Oncology

## 2020-02-06 DIAGNOSIS — Y999 Unspecified external cause status: Secondary | ICD-10-CM | POA: Diagnosis not present

## 2020-02-06 DIAGNOSIS — W208XXA Other cause of strike by thrown, projected or falling object, initial encounter: Secondary | ICD-10-CM | POA: Diagnosis not present

## 2020-02-06 DIAGNOSIS — M7989 Other specified soft tissue disorders: Secondary | ICD-10-CM | POA: Diagnosis not present

## 2020-02-06 DIAGNOSIS — M25572 Pain in left ankle and joints of left foot: Secondary | ICD-10-CM | POA: Diagnosis not present

## 2020-02-06 DIAGNOSIS — M79672 Pain in left foot: Secondary | ICD-10-CM | POA: Diagnosis not present

## 2020-02-06 DIAGNOSIS — M7732 Calcaneal spur, left foot: Secondary | ICD-10-CM | POA: Diagnosis not present

## 2020-02-06 DIAGNOSIS — S9002XA Contusion of left ankle, initial encounter: Secondary | ICD-10-CM | POA: Diagnosis not present
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
## 2020-02-11 ENCOUNTER — Emergency Department (HOSPITAL_BASED_OUTPATIENT_CLINIC_OR_DEPARTMENT_OTHER): Payer: Medicare HMO

## 2020-02-11 ENCOUNTER — Other Ambulatory Visit: Payer: Self-pay

## 2020-02-11 ENCOUNTER — Emergency Department (HOSPITAL_BASED_OUTPATIENT_CLINIC_OR_DEPARTMENT_OTHER)
Admission: EM | Admit: 2020-02-11 | Discharge: 2020-02-11 | Disposition: A | Discharge status: Home / Self Care | Payer: Medicare HMO | Attending: Emergency Medicine | Admitting: Emergency Medicine

## 2020-02-11 ENCOUNTER — Encounter (HOSPITAL_BASED_OUTPATIENT_CLINIC_OR_DEPARTMENT_OTHER): Payer: Self-pay

## 2020-02-11 DIAGNOSIS — Z87891 Personal history of nicotine dependence: Secondary | ICD-10-CM | POA: Diagnosis not present

## 2020-02-11 DIAGNOSIS — R112 Nausea with vomiting, unspecified: Secondary | ICD-10-CM | POA: Diagnosis not present

## 2020-02-11 DIAGNOSIS — Z79899 Other long term (current) drug therapy: Secondary | ICD-10-CM | POA: Diagnosis not present

## 2020-02-11 DIAGNOSIS — Z8542 Personal history of malignant neoplasm of other parts of uterus: Secondary | ICD-10-CM | POA: Diagnosis not present

## 2020-02-11 DIAGNOSIS — R2241 Localized swelling, mass and lump, right lower limb: Secondary | ICD-10-CM | POA: Insufficient documentation

## 2020-02-11 DIAGNOSIS — I10 Essential (primary) hypertension: Secondary | ICD-10-CM | POA: Diagnosis not present

## 2020-02-11 DIAGNOSIS — Z959 Presence of cardiac and vascular implant and graft, unspecified: Secondary | ICD-10-CM | POA: Insufficient documentation

## 2020-02-11 DIAGNOSIS — M7989 Other specified soft tissue disorders: Secondary | ICD-10-CM | POA: Diagnosis not present

## 2020-02-11 DIAGNOSIS — J4530 Mild persistent asthma, uncomplicated: Secondary | ICD-10-CM | POA: Insufficient documentation

## 2020-02-11 DIAGNOSIS — K5900 Constipation, unspecified: Secondary | ICD-10-CM | POA: Insufficient documentation

## 2020-02-11 DIAGNOSIS — Z91010 Allergy to peanuts: Secondary | ICD-10-CM | POA: Diagnosis not present

## 2020-02-11 DIAGNOSIS — R1031 Right lower quadrant pain: Secondary | ICD-10-CM | POA: Diagnosis not present

## 2020-02-11 LAB — CBC WITH DIFFERENTIAL/PLATELET
Abs Immature Granulocytes: 0.03 10*3/uL (ref 0.00–0.07)
Basophils Absolute: 0 10*3/uL (ref 0.0–0.1)
Basophils Relative: 0 %
Eosinophils Absolute: 0.1 10*3/uL (ref 0.0–0.5)
Eosinophils Relative: 1 %
HCT: 44.9 % (ref 36.0–46.0)
Hemoglobin: 14.2 g/dL (ref 12.0–15.0)
Immature Granulocytes: 0 %
Lymphocytes Relative: 19 %
Lymphs Abs: 1.7 10*3/uL (ref 0.7–4.0)
MCH: 28.5 pg (ref 26.0–34.0)
MCHC: 31.6 g/dL (ref 30.0–36.0)
MCV: 90.2 fL (ref 80.0–100.0)
Monocytes Absolute: 0.7 10*3/uL (ref 0.1–1.0)
Monocytes Relative: 7 %
Neutro Abs: 6.4 10*3/uL (ref 1.7–7.7)
Neutrophils Relative %: 73 %
Platelets: 194 10*3/uL (ref 150–400)
RBC: 4.98 MIL/uL (ref 3.87–5.11)
RDW: 14 % (ref 11.5–15.5)
WBC: 8.9 10*3/uL (ref 4.0–10.5)
nRBC: 0 % (ref 0.0–0.2)

## 2020-02-11 LAB — COMPREHENSIVE METABOLIC PANEL
ALT: 26 U/L (ref 0–44)
AST: 24 U/L (ref 15–41)
Albumin: 4 g/dL (ref 3.5–5.0)
Alkaline Phosphatase: 68 U/L (ref 38–126)
Anion gap: 9 (ref 5–15)
BUN: 14 mg/dL (ref 6–20)
CO2: 24 mmol/L (ref 22–32)
Calcium: 8.8 mg/dL — ABNORMAL LOW (ref 8.9–10.3)
Chloride: 108 mmol/L (ref 98–111)
Creatinine, Ser: 0.61 mg/dL (ref 0.44–1.00)
GFR calc Af Amer: >60 mL/min (ref >60)
GFR calc non Af Amer: >60 mL/min (ref >60)
Glucose, Bld: 105 mg/dL — ABNORMAL HIGH (ref 70–99)
Potassium: 4.5 mmol/L (ref 3.5–5.1)
Sodium: 141 mmol/L (ref 135–145)
Total Bilirubin: 0.6 mg/dL (ref 0.3–1.2)
Total Protein: 7.5 g/dL (ref 6.5–8.1)

## 2020-02-11 LAB — LIPASE, BLOOD: Lipase: 29 U/L (ref 11–51)

## 2020-02-11 MED ORDER — ONDANSETRON 4 MG PO TBDP
ORAL_TABLET | ORAL | 0 refills | Status: DC
Start: 2020-02-11 — End: 2020-02-27

## 2020-02-11 MED ORDER — MORPHINE SULFATE (PF) 4 MG/ML IV SOLN: 4.0000 mg | Freq: Once | INTRAVENOUS | Status: DC

## 2020-02-11 MED ORDER — IBUPROFEN 800 MG PO TABS
800.0000 mg | ORAL_TABLET | Freq: Once | ORAL | Status: AC
  Administered 2020-02-11: 800 mg via ORAL
  Filled 2020-02-11: qty 1

## 2020-02-11 MED ORDER — ONDANSETRON HCL 4 MG/2ML IJ SOLN: 4.0000 mg | Freq: Once | INTRAMUSCULAR | Status: DC

## 2020-02-11 MED ORDER — SODIUM CHLORIDE 0.9 % IV BOLUS
1000.0000 mL | Freq: Once | INTRAVENOUS | Status: AC
  Administered 2020-02-11: 1000 mL via INTRAVENOUS

## 2020-02-11 NOTE — Discharge Instructions (Signed)
Take zofran for nausea. Stay hydrated.   See your Padget's doctor tomorrow and see your GI, GYN, and oncologist if you have persistent pain   Return to ER if you have vomiting, dehydration, worsening pain
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
## 2020-02-11 NOTE — ED Notes (Addendum)
PT Refused to change into gown

## 2020-02-11 NOTE — ED Triage Notes (Signed)
Pt c/o RLQ pain, n/v x 2 days-NAD-steady gait

## 2020-02-12 DIAGNOSIS — Z79899 Other long term (current) drug therapy: Secondary | ICD-10-CM | POA: Diagnosis not present

## 2020-02-12 DIAGNOSIS — Z87891 Personal history of nicotine dependence: Secondary | ICD-10-CM | POA: Diagnosis not present

## 2020-02-12 DIAGNOSIS — C4499 Other specified malignant neoplasm of skin, unspecified: Secondary | ICD-10-CM | POA: Diagnosis not present

## 2020-02-12 DIAGNOSIS — C519 Malignant neoplasm of vulva, unspecified: Secondary | ICD-10-CM | POA: Diagnosis not present

## 2020-02-13 DIAGNOSIS — K581 Irritable bowel syndrome with constipation: Secondary | ICD-10-CM | POA: Diagnosis not present

## 2020-02-17 DIAGNOSIS — J4520 Mild intermittent asthma, uncomplicated: Secondary | ICD-10-CM | POA: Diagnosis not present

## 2020-02-17 DIAGNOSIS — R05 Cough: Secondary | ICD-10-CM | POA: Diagnosis not present

## 2020-02-17 DIAGNOSIS — R42 Dizziness and giddiness: Secondary | ICD-10-CM | POA: Diagnosis not present

## 2020-02-17 DIAGNOSIS — R9431 Abnormal electrocardiogram [ECG] [EKG]: Secondary | ICD-10-CM | POA: Diagnosis not present

## 2020-02-17 DIAGNOSIS — J029 Acute pharyngitis, unspecified: Secondary | ICD-10-CM | POA: Diagnosis not present

## 2020-02-17 DIAGNOSIS — Z87891 Personal history of nicotine dependence: Secondary | ICD-10-CM | POA: Diagnosis not present

## 2020-02-17 DIAGNOSIS — K219 Gastro-esophageal reflux disease without esophagitis: Secondary | ICD-10-CM | POA: Diagnosis not present

## 2020-02-17 DIAGNOSIS — R0789 Other chest pain: Secondary | ICD-10-CM | POA: Diagnosis not present

## 2020-02-17 DIAGNOSIS — R0602 Shortness of breath: Secondary | ICD-10-CM | POA: Diagnosis not present

## 2020-02-18 ENCOUNTER — Ambulatory Visit (INDEPENDENT_AMBULATORY_CARE_PROVIDER_SITE_OTHER): Payer: Medicare HMO | Admitting: Family

## 2020-02-18 ENCOUNTER — Other Ambulatory Visit: Payer: Self-pay

## 2020-02-18 ENCOUNTER — Encounter: Payer: Self-pay | Admitting: Family

## 2020-02-18 VITALS — BP 160/90 | HR 98 | Temp 98.0°F | Resp 20 | Ht 62.5 in | Wt 218.0 lb

## 2020-02-18 DIAGNOSIS — J309 Allergic rhinitis, unspecified: Secondary | ICD-10-CM

## 2020-02-18 DIAGNOSIS — J4521 Mild intermittent asthma with (acute) exacerbation: Secondary | ICD-10-CM | POA: Diagnosis not present

## 2020-02-18 DIAGNOSIS — J383 Other diseases of vocal cords: Secondary | ICD-10-CM | POA: Diagnosis not present

## 2020-02-18 DIAGNOSIS — Z79899 Other long term (current) drug therapy: Secondary | ICD-10-CM

## 2020-02-18 DIAGNOSIS — R9431 Abnormal electrocardiogram [ECG] [EKG]: Secondary | ICD-10-CM | POA: Diagnosis not present

## 2020-02-18 DIAGNOSIS — K219 Gastro-esophageal reflux disease without esophagitis: Secondary | ICD-10-CM | POA: Diagnosis not present

## 2020-02-18 DIAGNOSIS — T7800XD Anaphylactic reaction due to unspecified food, subsequent encounter: Secondary | ICD-10-CM

## 2020-02-18 MED ORDER — LEVALBUTEROL TARTRATE 45 MCG/ACT IN AERO
2.0000 | INHALATION_SPRAY | Freq: Four times a day (QID) | RESPIRATORY_TRACT | 1 refills | Status: DC | PRN
Start: 2020-02-18 — End: 2020-05-10

## 2020-02-18 MED ORDER — EPINEPHRINE 0.3 MG/0.3ML IJ SOAJ
0.3000 mg | Freq: Once | INTRAMUSCULAR | 1 refills | Status: AC
Start: 2020-02-18 — End: 2020-02-18

## 2020-02-18 NOTE — Progress Notes (Addendum)
[place] [address] Dept: [phone]  FOLLOW UP NOTE  Patient ID: Jennifer [name], female    DOB: August 06, 1966  Age: 54 y.o. MRN: [id number] Date of Office Visit: 02/18/2020  Assessment  Chief Complaint: Asthma  HPI Jennifer [name] is a 54 year old female who presents for follow-up of asthma, allergic rhinitis, cough, and reflux.  She was last seen on August 11, 2019 by Gareth Morgan, FNP.  Asthma is reported as not well controlled with the use of Xopenex 2 puffs every 6 hours as needed.  She does not feel like the Xopenex relieves her breathing symptoms, but reports that it expired Jan 2021.  She is not able to use other inhalers due to thrush and other problems that she cannot remember.  She is not interested at this time this time and trying other inhalers.  She reports a dry cough that she had yesterday, wheezing at times, tightness in her chest at times, a lot of shortness of breath, and one time last week she had nocturnal awakening.  She has been to the emergency room 1 time for her asthma and reports that she has had 1 steroid injection and maybe a couple of steroid pills for her asthma.  She reports that yesterday she went to the emergency room due to her breathing problems.  She was around her son yesterday who has 3 dogs and after being around them she felt tight in her chest and short of breath.  She wonders if she is now allergic to dogs.  She was given  steroids by the ER but she has not picked it up yet.  She reports that she is not able to tolerate prednisone due to it causing her to sweat, have increased heart rate, and cause her blood sugar to rise.  She does mention that she is able to tolerate dexamethasone.  Allergic rhinitis is reported as not well controlled with the use of azelastine nasal spray, Flonase nasal spray and nasal saline rinses.  She reports nasal congestion that is worse with her mask, postnasal drip, and occasional sneezing.  She denies any  rhinorrhea.  She reports that this regimen of medications does not help her symptoms.  She also wonders if she is now allergic to dogs.  Reflux is reported as not well controlled with the use of Tums.  She is not able to take famotidine due to its interaction with metoprolol.  She also reports that she does not do well with proton pump inhibitors because they cause her chest pressure.  She reports that she saw GI recently and are aware of her not being able to take proton pump inhibitors.  She also wonders if she has possible food allergies to milk,garlic, other spices, and other foods.  She reports that couple times a week she will have a sensation of her tongue feeling thicker with red puffiness around the edge of her tongue.  At times she will have shortness of breath with this, but denies any other cardiac or gastrointestinal symptoms.  She has tried taking Benadryl in the past for these symptoms and reports that the Benadryl  knocked her out.  If she does not take Benadryl her tongue will feel better by the next day.  She does not have an EpiPen.  She also reports that tomatoes cause her tongue to feel thick with bumps, pistachios and cashews cause her to feel tight around her neck, carrots also bother her throat, and strawberry and  cantaloupe causes her to have a thick tongue.  On October 25, 2016 she had angioedema labs that were unrevealing.  Current medications are as listed in the chart.  Drug Allergies:  Allergies  Allergen Reactions  . Azithromycin Shortness Of Breath    Lip swelling, SOB.     . Ciprofloxacin Swelling    REACTION: tongue swells  . Codeine Shortness Of Breath  . Erythromycin Base Itching and Rash  . Milk-Related Compounds Swelling    Throat feels tight  . Mushroom Extract Complex Other (See Comments), Anaphylaxis and Rash    Didn't feel right Per allergist do not take  . Peanut Oil Anaphylaxis    Other reaction(s): Other (See Comments) Per allergist,do not  take Other reaction(s): Other (See Comments) Per allergist,do not take Per allergist, do not take  . Sulfa Antibiotics Shortness Of Breath, Rash and Other (See Comments)  . Sulfasalazine Rash and Shortness Of Breath    Other reaction(s): Other (See Comments) Other reaction(s): SHORTNESS OF BREATH  . Telmisartan Swelling    Tongue swelling, Micardis  . Ace Inhibitors Cough  . Aspirin Hives and Other (See Comments)    flushing  . Avelox [Moxifloxacin Hcl In Nacl] Itching       . Beta Adrenergic Blockers Other (See Comments)    Feels like chest tightening labetalol, bystolic  Feels like chest tightening "Metoprolol"   . Buspar [Buspirone] Other (See Comments)    Light headed  . Butorphanol Tartrate Other (See Comments)    Patient aggitated  . Cetirizine Hives and Rash       . Clonidine Hcl     REACTION: makes blood pressure high  . Cortisone     Feels like she is going crazy  . Erythromycin Rash  . Fentanyl Other (See Comments)    aggressive   . Fluoxetine Hcl Other (See Comments)    REACTION: headaches  . Ketorolac Tromethamine     jittery  . Lidocaine Other (See Comments)    When it involves the throat,   . Lisinopril Cough  . Metoclopramide Hcl Other (See Comments)    Dystonic reaction  . Midazolam Other (See Comments)    agitation Slow to wake up  . Montelukast Other (See Comments)    Singulair  . Montelukast Sodium Other (See Comments)    DOES NOT REMEMBER  Don't remember-told not to take  . Naproxen Other (See Comments)    FLUSHING Pt states she took Ibuprofen today (10/08/19)  . Paroxetine Other (See Comments)    REACTION: headaches  . Penicillins Rash  . Pravastatin Other (See Comments)    Myalgias  . Promethazine Other (See Comments)    Dystonic reaction  . Promethazine Hcl Other (See Comments)    jittery  . Quinolones Swelling and Rash  . Serotonin Reuptake Inhibitors (Ssris) Other (See Comments)    Headache Effexor, prozac, zoloft,   .  Sertraline Hcl     REACTION: headaches  . Stelazine [Trifluoperazine] Other (See Comments)    Dystonic reaction  . Tobramycin Itching and Rash  . Trifluoperazine Hcl     dystonic  . Whey     Milk allergy  . Propoxyphene   . Adhesive [Tape] Rash    EKG monitor patches, some tapes Blisters, rash, itching, welts.  . Butorphanol Anxiety    Patient agitated  . Ceftriaxone Rash    rocephin  . Iron Rash    Flushing with certain IV types  . Metoclopramide Itching and Other (See Comments)  Dystonic reaction  . Metronidazole Rash  . Other Rash and Other (See Comments)    Uncoded Allergy. Allergen: steriods, Other Reaction: Not Assessed Other reaction(s): Flushing (ALLERGY/intolerance), GI Upset (intolerance), Hypertension (intolerance), Increased Heart Rate (intolerance), Mental Status Changes (intolerance), Other (See Comments), Tachycardia / Palpitations(intolerance) Hospital gowns leave a rash.   . Prednisone Anxiety and Palpitations  . Prochlorperazine Anxiety    Compazine:  Dystonic reaction  . Venlafaxine Anxiety  . Zyrtec [Cetirizine Hcl] Rash    All over body    Review of Systems: Review of Systems  Constitutional: Negative for chills and fever.  HENT: Positive for congestion.   Eyes: Negative for blurred vision.  Respiratory: Positive for cough, shortness of breath and wheezing.   Gastrointestinal: Positive for heartburn.  Genitourinary: Negative for dysuria.  Skin: Negative for itching and rash.  Neurological: Positive for headaches.  Endo/Heme/Allergies: Positive for environmental allergies.    Physical Exam: BP (!) 160/90   Pulse 98   Temp 98 F (36.7 C) (Oral)   Resp 20   Ht 5' 2.5" (1.588 m)   Wt 218 lb (98.9 kg)   LMP 06/25/2013   SpO2 98%   BMI 39.24 kg/m    Physical Exam Constitutional:      Appearance: Normal appearance.  HENT:     Head: Normocephalic and atraumatic.     Comments: Pharynx normal. Eyes normal. Ears normal. Nose normal.     Right Ear: Tympanic membrane, ear canal and external ear normal.     Left Ear: Tympanic membrane, ear canal and external ear normal.     Nose: Nose normal.     Mouth/Throat:     Mouth: Mucous membranes are moist.     Pharynx: Oropharynx is clear.  Eyes:     Conjunctiva/sclera: Conjunctivae normal.  Cardiovascular:     Rate and Rhythm: Regular rhythm.  Pulmonary:     Effort: Pulmonary effort is normal.     Breath sounds: Normal breath sounds.     Comments: Lungs clear to auscultation. Musculoskeletal:     Cervical back: Neck supple.  Skin:    General: Skin is warm.  Neurological:     Mental Status: She is alert and oriented to person, place, and time.  Psychiatric:        Mood and Affect: Mood normal.        Behavior: Behavior normal.        Thought Content: Thought content normal.        Judgment: Judgment normal.     Diagnostics: FVC 2.84 L, FEV1 2.41 L.  Predicted FVC 3.33 L, FEV1 2.62 L.  Spirometry indicates normal ventilatory function.  Post bronchodilator response to 2 puffs of Xopenex FVC 2.92 L, FEV1 2.60 L.  Predicted FVC 3.33 L, predicted FEV1 2.62 L.  Spirometry indicates normal ventilatory function with minimal bronchodilator response.  Assessment and Plan: 1. Mild intermittent asthma with acute exacerbation   2. Anaphylaxis due to food, subsequent encounter   3. Allergic rhinitis, unspecified seasonality, unspecified trigger   4. Gastroesophageal reflux disease without esophagitis   5. Current use of beta blocker   6. Vocal cord dysfunction     No orders of the defined types were placed in this encounter.   Patient Instructions  Asthma Take steroids as given by emergency room yesterday May use Xopenex 2 puffs every 6 hours as needed for cough, wheeze, tightness in chest, and shortness of breath. Schedule methacholine challenge. Do not use your Xopenex the day of  the test  Possible food allergy Avoid milk and garlic and other foods that you suspect. In  case of an allergic reaction, give Benadryl 4 teaspoonfuls every 4 hours, and if life-threatening symptoms occur, inject with EpiPen 0.3 mg. We will order some labs to check for possible food allergies and recheck your environmental allergies. We will call you with results.  Allergic rhinitis Continue azelastine nasal spray- 2 sprays each nostril twice a day as needed for runny nose Continue Flonase- 1-2 sprays in each nostril once a day for nasal congestion. Continue saline nasal rinses as needed for nasal symptoms. Please use this prior to any medicated nasal sprays Refer to ENT due to refractory to medicines  Reflux Follow up with your gastrointestinal doctor concerning reflux symptoms  Please let us know if this treatment plan is not working well for you Schedule follow up appointment in 2 months   Return in about 2 months (around 04/20/2020), or if symptoms worsen or fail to improve.    Thank you for the opportunity to care for this patient.  Please do not hesitate to contact me with questions.  Althea Charon, FNP [facility] [place]  ________________________________________________  I have provided oversight concerning [facility]'s evaluation and treatment of this patient's health issues addressed during today's encounter.  I agree with the assessment and therapeutic plan as outlined in the note.   Signed,   R Edgar Frisk, MD

## 2020-02-18 NOTE — Patient Instructions (Signed)
Asthma Take steroids as given by emergency room yesterday May use Xopenex 2 puffs every 6 hours as needed for cough, wheeze, tightness in chest, and shortness of breath. Schedule methacholine challenge. Do not use your Xopenex the day of the test  Possible food allergy Avoid milk and garlic and other foods that you suspect. In case of an allergic reaction, give Benadryl 4 teaspoonfuls every 4 hours, and if life-threatening symptoms occur, inject with EpiPen 0.3 mg. We will order some labs to check for possible food allergies and recheck your environmental allergies. We will call you with results.  Allergic rhinitis Continue azelastine nasal spray- 2 sprays each nostril twice a day as needed for runny nose Continue Flonase- 1-2 sprays in each nostril once a day for nasal congestion. Continue saline nasal rinses as needed for nasal symptoms. Please use this prior to any medicated nasal sprays Refer to ENT due to refractory to medicines  Reflux Follow up with your gastrointestinal doctor concerning reflux symptoms  Please let us know if this treatment plan is not working well for you Schedule follow up appointment in 2 months

## 2020-02-20 ENCOUNTER — Other Ambulatory Visit: Payer: Self-pay

## 2020-02-20 ENCOUNTER — Ambulatory Visit (INDEPENDENT_AMBULATORY_CARE_PROVIDER_SITE_OTHER): Payer: Medicare HMO

## 2020-02-20 ENCOUNTER — Encounter: Payer: Self-pay | Admitting: Medical-Surgical

## 2020-02-20 ENCOUNTER — Ambulatory Visit (INDEPENDENT_AMBULATORY_CARE_PROVIDER_SITE_OTHER): Payer: Medicare HMO | Admitting: Medical-Surgical

## 2020-02-20 VITALS — BP 142/88 | HR 90 | Temp 98.1°F | Ht 62.5 in | Wt 220.6 lb

## 2020-02-20 DIAGNOSIS — M5030 Other cervical disc degeneration, unspecified cervical region: Secondary | ICD-10-CM | POA: Diagnosis not present

## 2020-02-20 DIAGNOSIS — M25561 Pain in right knee: Secondary | ICD-10-CM

## 2020-02-20 DIAGNOSIS — M1711 Unilateral primary osteoarthritis, right knee: Secondary | ICD-10-CM

## 2020-02-20 DIAGNOSIS — L989 Disorder of the skin and subcutaneous tissue, unspecified: Secondary | ICD-10-CM | POA: Diagnosis not present

## 2020-02-20 IMAGING — US RIGHT UPPER EXTREMITY VENOUS ULTRASOUND
1 series · 14 of 24 positions shown · non-contrast
Comparison: None.

CLINICAL DATA: Right axillary swelling, previous right arm
catheterization procedure

EXAM:
RIGHT UPPER EXTREMITY VENOUS DOPPLER ULTRASOUND
TECHNIQUE: Gray-scale sonography with graded compression, as well as color
Doppler and duplex ultrasound were performed to evaluate the upper
extremity deep venous system from the level of the subclavian vein
and including the jugular, axillary, basilic and upper cephalic
vein. Spectral Doppler was utilized to evaluate flow at rest and
with distal augmentation maneuvers.

[Series 1: right upper extremity venous ultrasound · 14 of 31 slices shown]
[im 1/31]
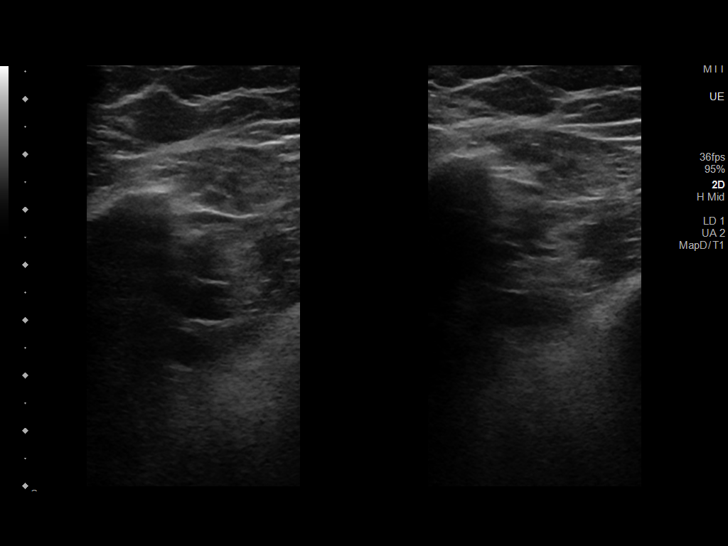
[im 3/31]
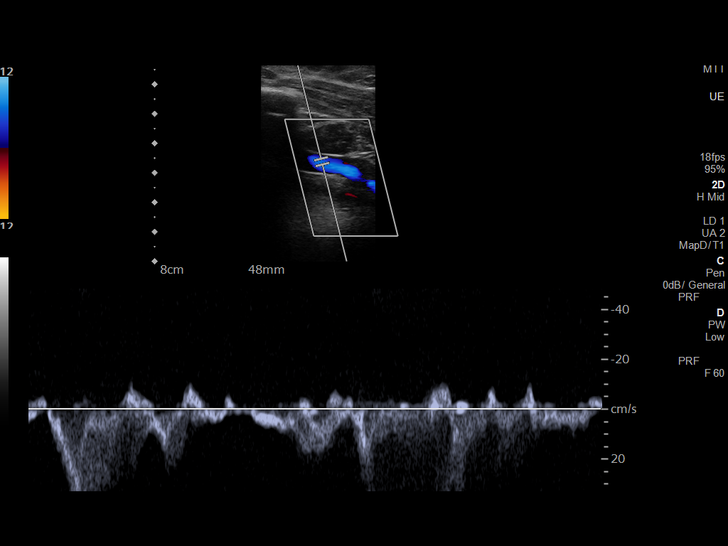
[im 6/31]
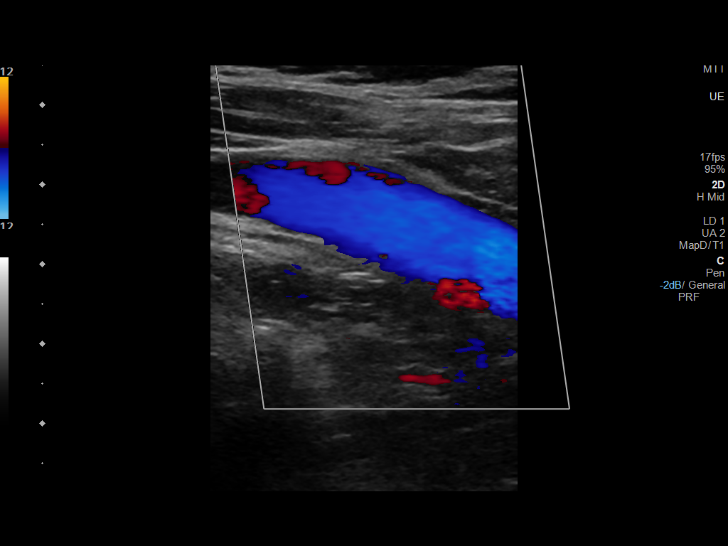
[im 8/31]
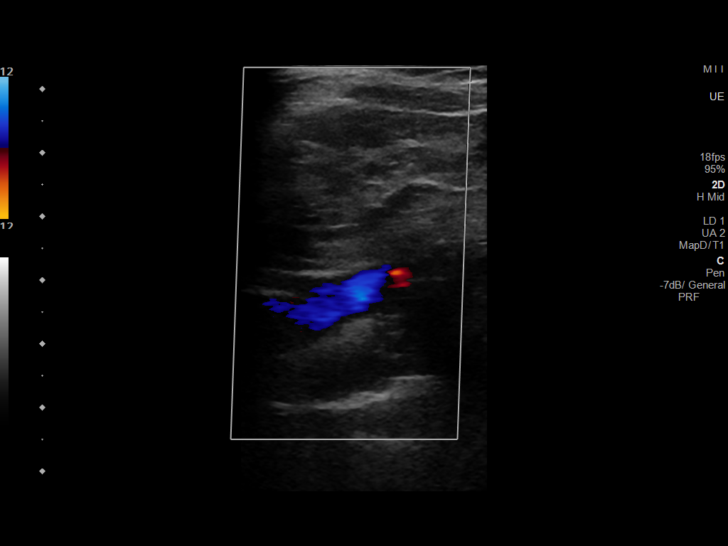
[im 10/31]
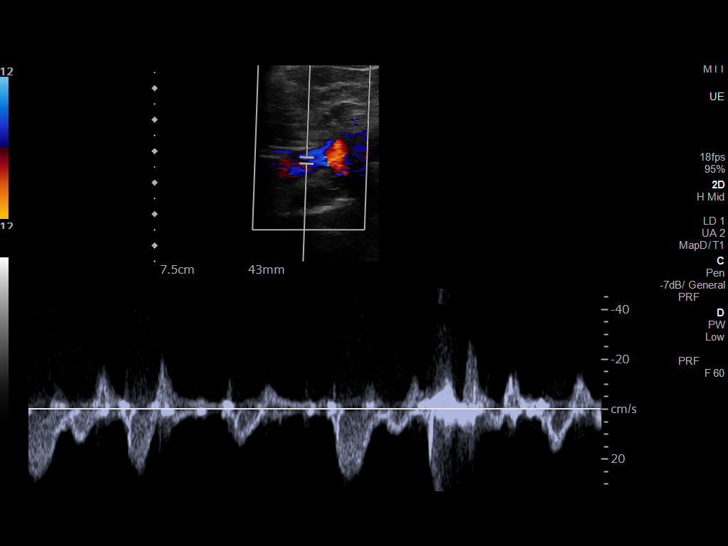
[im 12/31]
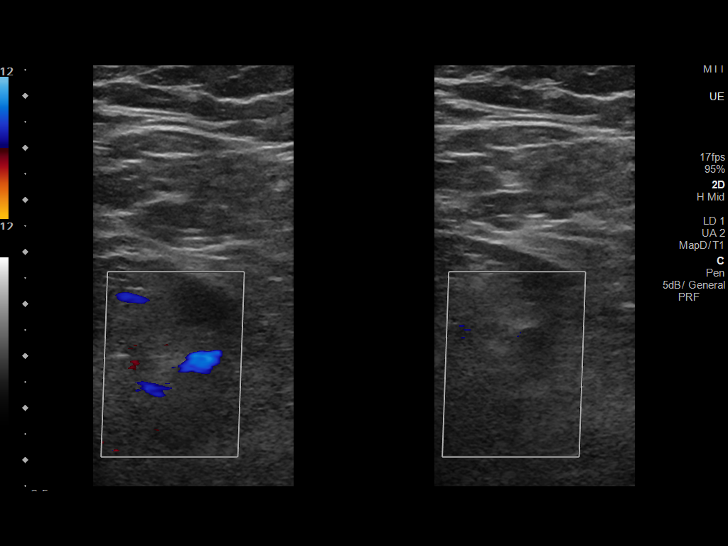
[im 15/31]
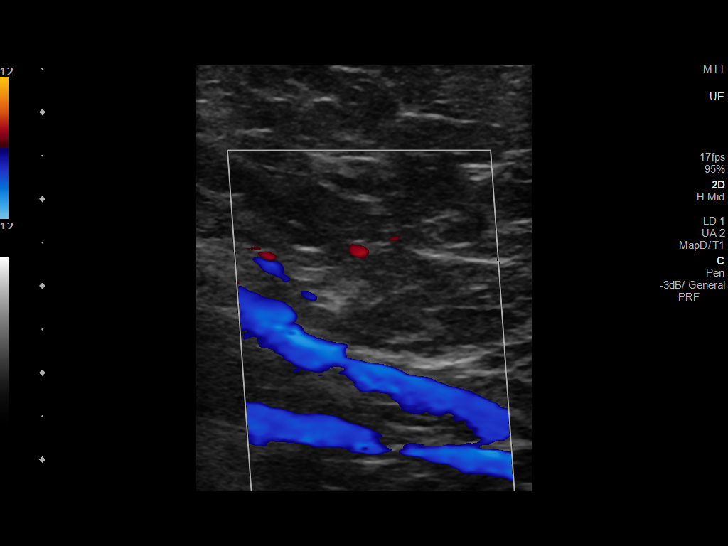
[im 16/31]
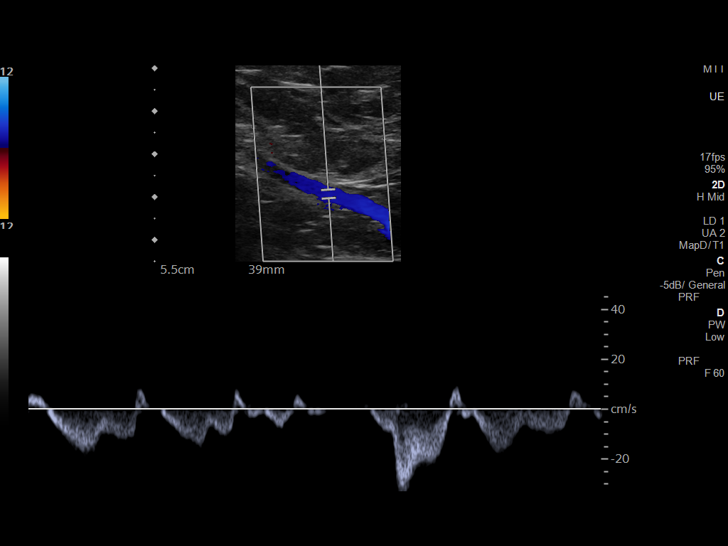
[im 19/31]
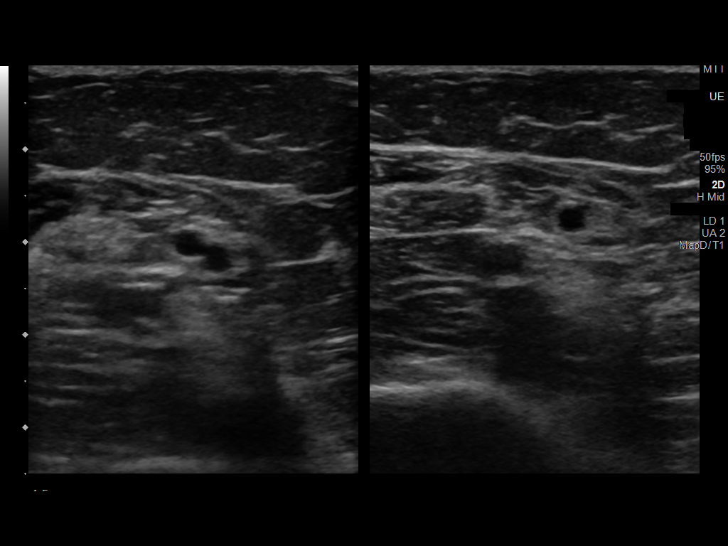
[im 21/31]
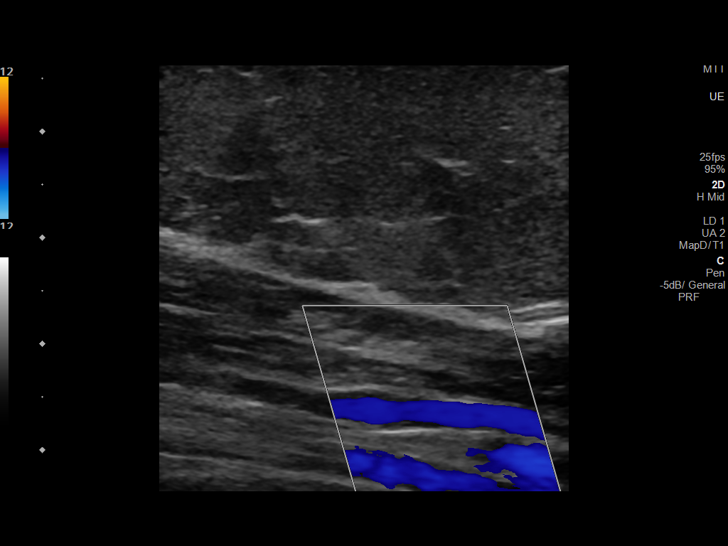
[im 24/31]
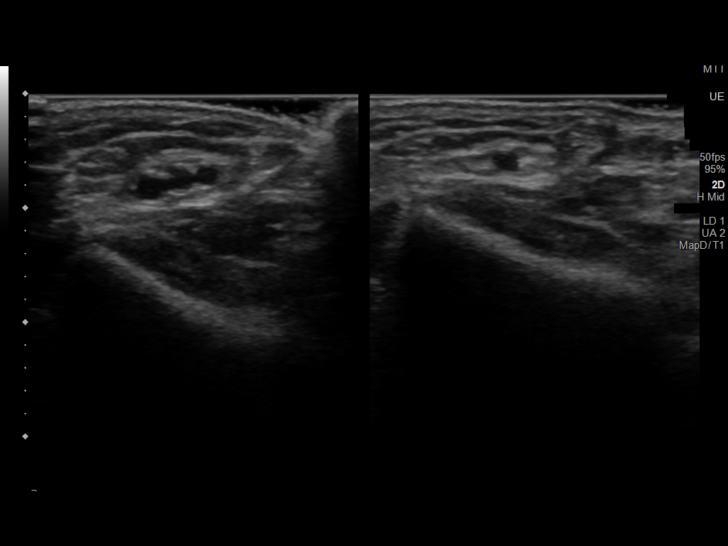
[im 25/31]
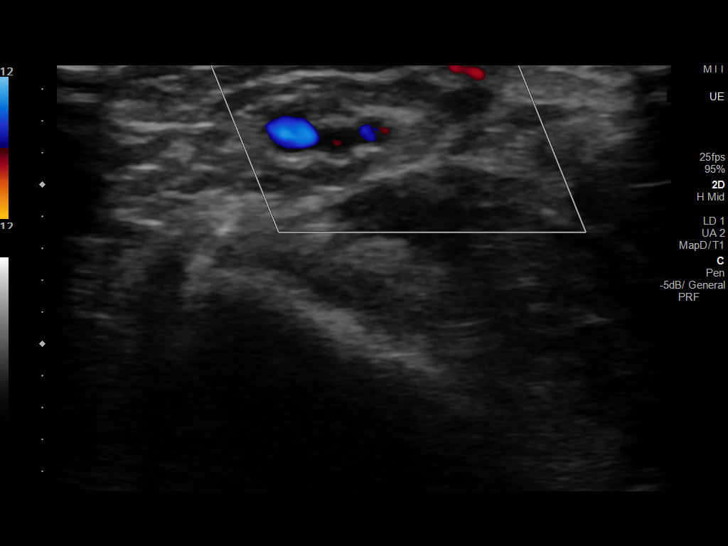
[im 28/31]
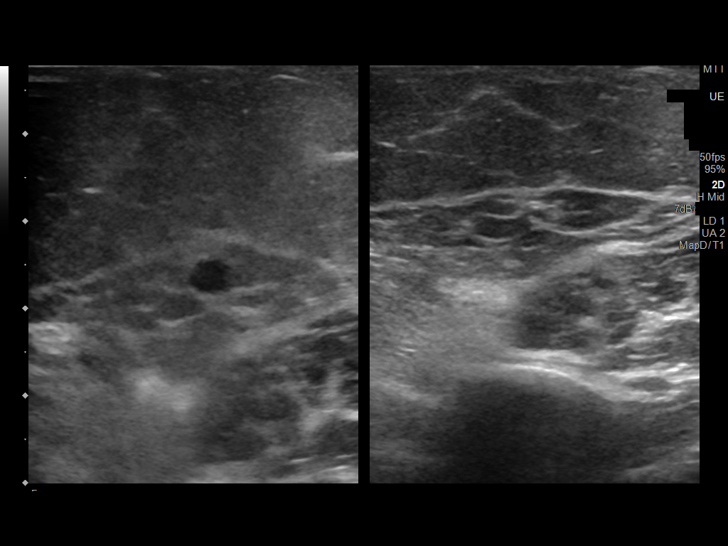
[im 31/31]
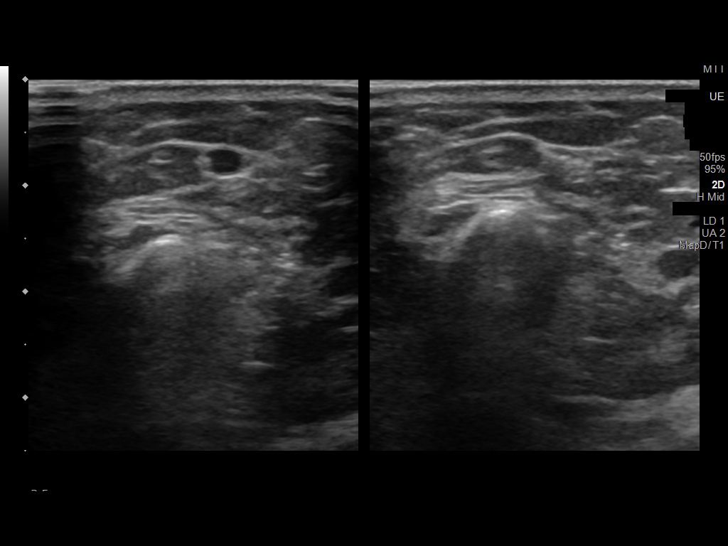

[14 of 24 positions shown; findings below may reference images not displayed]

FINDINGS: Thrombus within deep veins:  None visualized.

Compressibility of deep veins:  Normal.

Duplex waveform respiratory phasicity:  Normal.

Duplex waveform response to augmentation:  Normal.

Venous reflux:  None visualized.

Other findings: Limited images of the contralateral subclavian vein
unremarkable.
IMPRESSION: Negative for right upper extremity DVT

## 2020-02-20 NOTE — Patient Instructions (Signed)
Right knee:  We are getting an x-ray today. Please do the exercises on the handout daily. If you can afford it, get Voltaren gel over the counter and use that up to 4 times daily. The Dexamethasone should help with the knee pain. If the brace does not help, you can try an ACE wrap. Use heat or ice as tolerate for relief.   Left thumb: We can do cryotherapy on the lesion if your home treatment does not work. Let us know if you'd like to have this done.  Orthopedic referral: This was placed on 02/20/2020. If you have not been contacted in the next week or two, let us know and we will touch base to move it along.
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
## 2020-02-21 DIAGNOSIS — M25561 Pain in right knee: Secondary | ICD-10-CM | POA: Diagnosis not present

## 2020-02-23 ENCOUNTER — Telehealth: Payer: Self-pay

## 2020-02-23 DIAGNOSIS — J4521 Mild intermittent asthma with (acute) exacerbation: Secondary | ICD-10-CM

## 2020-02-23 NOTE — Telephone Encounter (Signed)
-----   Message from Althea Charon, [place] sent at 02/18/2020 11:32 AM EDT ----- Please refer to ENT- Dr. Wendie Chess- refractory to medications for allergic rhinitis

## 2020-02-23 NOTE — Telephone Encounter (Signed)
Patient called back returning phone call from [place]. Patient states that she saw an ENT, Dr. Vicente Masson and she does not feel that they can do anything else for her. Patient states she has a deviated septum, but does not want to do the surgery due to the risk.  Patient is still having trouble breathing mostly from sinuses and doesn't know if it is from breathing something in or being around something she is allergic to.  Patient was given ENT information and she will be calling to cancel appointment.   Please advise on next steps.

## 2020-02-23 NOTE — Telephone Encounter (Signed)
Lm for respiratory department to call me back about the methacholine challenge

## 2020-02-23 NOTE — Telephone Encounter (Signed)
Please let her know that once I get a chance to review her labs, we can discuss what we will do next. Thank you!

## 2020-02-23 NOTE — Telephone Encounter (Signed)
I spoke to Jennifer [name] and she informed me that no one has reached out to her regarding scheduling the Methacholine Challenge. Patient would like for the place to contact her for scheduling as she has a very hectic schedule. Can someone look into this please?   Patient also got her labs done with [facility] & they are on [place] desk in [facility] for review.    Patient does not want to see a provider for VCD. She states she has seen one before and does not believe this is her issue. Patient did ask the ENT scheduler if they could possibly do a virtual visit instead of her driving to [place]. She is waiting to hear back from their office on this.   Patient states she was told about Budesonide rinses and was wondering if this is something that could possibly help her?   I also forgot to schedule the patient for a follow up with Dr Verlin Fester, could someone please give the patient a call back to schedule?  Thanks

## 2020-02-23 NOTE — Telephone Encounter (Signed)
Spoke with the patient and she reports that she has not heard anything about the scheduling of her methacholine challenge. She reports that her breathing problems at times are due to upper airway and that Xopenex helps sometimes. The steroids that she was given from her last office visit did not help, but reports that she cannot take higher dose steroids due to it causing her blood sugar to rise, tachycardia, and sweating. She also reports that she has not tolerated inhalers with steroids in the past. Instructed her that if her breathing worsens to go to the emergency room. She also reports that she still has something that bothers her tongue causing it to feel thick/swollen with ridges. She does report that she does have her EpiPen.   Can you please call to see where she is in the process of being scheduled for a methacholine challenge and see if her lab results are back from her last visit on 02/18/20? Please also instruct her that it would be good to see ENT to see if her vocal cords are causing any of the symptoms. Also, please have her schedule her follow up appointment with Dr. Verlin Fester.  Thank you, Althea Charon

## 2020-02-23 NOTE — Telephone Encounter (Signed)
Thank you :)

## 2020-02-23 NOTE — Telephone Encounter (Signed)
Patient is scheduled for 03-15-2020 @ 1:20 with Dr Wendie Chess.  I called and left a voicemail for the patient to give me a call back to give her the appointment information.  Their office will also send   Address  [address], [place] [address] P: [phone]  I will try calling the patient again.   Thanks

## 2020-02-24 NOTE — Telephone Encounter (Addendum)
Spoke with pt and she stated ent told her she has deviated septum and bilateral hypertrophy. She has tried qvar but hates steroid inhalers as they cause thrush, even after using the swish and spit method.She does not want any steroid inhalers. She said ent asked her about the budesonide nasal rinses and wanted to know what Chrissie though about them and wanted to see if they would help her. She does have another raspy voice and not sure why. I am still waiting on call back about the methacholine challenge with cone

## 2020-02-24 NOTE — Telephone Encounter (Signed)
Please let her know that her lab results for the selected foods environmental inhalents are negative.  Have her continue her current regimen for allergies. Please be sure to keep Epipen available and if reaction  (tongue swelling) occurs keep a journal of symptoms that occurred and what you were doing and eating in the past 6 hours. Also, please ask her if she has tried Qvar or Alvesco inhalers.  If she has not tried these inhalers, is she willing to try one of these inhalers.Both of these inhalers are pro drug meaning that they dont start working until in the lungs and they should cause less side effects.

## 2020-02-24 NOTE — Telephone Encounter (Signed)
If ENT would like for her to try budesonide with her sinus rinse that is ok from our stand point. I would just make sure she understands that she would need to stop Flonase nasal spray.

## 2020-02-24 NOTE — Addendum Note (Signed)
Addended by: Felipa Emory on: 02/24/2020 04:57 PM   Modules accepted: Orders

## 2020-02-24 NOTE — Progress Notes (Signed)
02/24/20 results received and all negative. Message sent to update patient of results.

## 2020-02-25 ENCOUNTER — Telehealth: Payer: Self-pay | Admitting: Family

## 2020-02-25 DIAGNOSIS — R0600 Dyspnea, unspecified: Secondary | ICD-10-CM | POA: Diagnosis not present

## 2020-02-25 DIAGNOSIS — R1110 Vomiting, unspecified: Secondary | ICD-10-CM | POA: Diagnosis not present

## 2020-02-25 DIAGNOSIS — R42 Dizziness and giddiness: Secondary | ICD-10-CM | POA: Diagnosis not present

## 2020-02-25 DIAGNOSIS — R61 Generalized hyperhidrosis: Secondary | ICD-10-CM | POA: Diagnosis not present

## 2020-02-25 DIAGNOSIS — Z20822 Contact with and (suspected) exposure to covid-19: Secondary | ICD-10-CM | POA: Diagnosis not present

## 2020-02-25 DIAGNOSIS — I10 Essential (primary) hypertension: Secondary | ICD-10-CM | POA: Diagnosis not present

## 2020-02-25 DIAGNOSIS — J90 Pleural effusion, not elsewhere classified: Secondary | ICD-10-CM | POA: Diagnosis not present

## 2020-02-25 DIAGNOSIS — Z87891 Personal history of nicotine dependence: Secondary | ICD-10-CM | POA: Diagnosis not present

## 2020-02-25 DIAGNOSIS — J4521 Mild intermittent asthma with (acute) exacerbation: Secondary | ICD-10-CM

## 2020-02-25 DIAGNOSIS — R05 Cough: Secondary | ICD-10-CM | POA: Diagnosis not present

## 2020-02-25 DIAGNOSIS — R0602 Shortness of breath: Secondary | ICD-10-CM | POA: Diagnosis not present

## 2020-02-25 DIAGNOSIS — R002 Palpitations: Secondary | ICD-10-CM | POA: Diagnosis not present

## 2020-02-25 DIAGNOSIS — R000 Tachycardia, unspecified: Secondary | ICD-10-CM | POA: Diagnosis not present

## 2020-02-25 LAB — BASIC METABOLIC PANEL
BUN: 14 (ref 4–21)
CO2: 29 — AB (ref 13–22)
Chloride: 107 (ref 99–108)
Creatinine: 0.6 (ref 0.5–1.1)
Glucose: 88
Potassium: 3.8 (ref 3.4–5.3)
Sodium: 142 (ref 137–147)

## 2020-02-25 LAB — COMPREHENSIVE METABOLIC PANEL: Calcium: 9.3 (ref 8.7–10.7)

## 2020-02-25 NOTE — Telephone Encounter (Signed)
Order faxed to [facility] central scheduling at [phone]

## 2020-02-25 NOTE — Telephone Encounter (Signed)
Jennifer [name],  Pended in this encounter is the methacholine challenge please sign and print and fax it to [phone] central scheduling at [facility]  They will call pt to schedule and then send [place] results via fax. Thank you

## 2020-02-25 NOTE — Telephone Encounter (Signed)
L/m for pt to call me back

## 2020-02-25 NOTE — Telephone Encounter (Signed)
I signed the pending order. Can you please fax and send order. Thank you!

## 2020-02-25 NOTE — Telephone Encounter (Signed)
Patient Requesed referral to Med center at [facility] and it was sent to [facility].

## 2020-02-26 DIAGNOSIS — R9431 Abnormal electrocardiogram [ECG] [EKG]: Secondary | ICD-10-CM | POA: Diagnosis not present

## 2020-02-27 ENCOUNTER — Encounter: Payer: Self-pay | Admitting: Family Medicine

## 2020-02-27 ENCOUNTER — Ambulatory Visit (INDEPENDENT_AMBULATORY_CARE_PROVIDER_SITE_OTHER): Payer: Medicare HMO | Admitting: Family Medicine

## 2020-02-27 ENCOUNTER — Other Ambulatory Visit: Payer: Self-pay

## 2020-02-27 ENCOUNTER — Telehealth: Payer: Self-pay

## 2020-02-27 VITALS — BP 151/81 | HR 74 | Ht 63.0 in

## 2020-02-27 DIAGNOSIS — E785 Hyperlipidemia, unspecified: Secondary | ICD-10-CM | POA: Diagnosis not present

## 2020-02-27 DIAGNOSIS — Z87891 Personal history of nicotine dependence: Secondary | ICD-10-CM | POA: Diagnosis not present

## 2020-02-27 DIAGNOSIS — I10 Essential (primary) hypertension: Secondary | ICD-10-CM | POA: Diagnosis not present

## 2020-02-27 DIAGNOSIS — K219 Gastro-esophageal reflux disease without esophagitis: Secondary | ICD-10-CM | POA: Diagnosis not present

## 2020-02-27 DIAGNOSIS — J45909 Unspecified asthma, uncomplicated: Secondary | ICD-10-CM | POA: Diagnosis not present

## 2020-02-27 DIAGNOSIS — G35 Multiple sclerosis: Secondary | ICD-10-CM | POA: Diagnosis not present

## 2020-02-27 DIAGNOSIS — G40909 Epilepsy, unspecified, not intractable, without status epilepticus: Secondary | ICD-10-CM | POA: Diagnosis not present

## 2020-02-27 DIAGNOSIS — F419 Anxiety disorder, unspecified: Secondary | ICD-10-CM | POA: Diagnosis not present

## 2020-02-27 DIAGNOSIS — R05 Cough: Secondary | ICD-10-CM | POA: Diagnosis not present

## 2020-02-27 DIAGNOSIS — R059 Cough, unspecified: Secondary | ICD-10-CM

## 2020-02-27 DIAGNOSIS — Z8542 Personal history of malignant neoplasm of other parts of uterus: Secondary | ICD-10-CM | POA: Diagnosis not present

## 2020-02-27 DIAGNOSIS — R9431 Abnormal electrocardiogram [ECG] [EKG]: Secondary | ICD-10-CM | POA: Diagnosis not present

## 2020-02-27 DIAGNOSIS — R221 Localized swelling, mass and lump, neck: Secondary | ICD-10-CM | POA: Diagnosis not present

## 2020-02-27 DIAGNOSIS — I498 Other specified cardiac arrhythmias: Secondary | ICD-10-CM | POA: Diagnosis not present

## 2020-02-27 DIAGNOSIS — J029 Acute pharyngitis, unspecified: Secondary | ICD-10-CM | POA: Diagnosis not present

## 2020-02-27 DIAGNOSIS — K2100 Gastro-esophageal reflux disease with esophagitis, without bleeding: Secondary | ICD-10-CM

## 2020-02-27 DIAGNOSIS — J984 Other disorders of lung: Secondary | ICD-10-CM | POA: Diagnosis not present

## 2020-02-27 DIAGNOSIS — R0602 Shortness of breath: Secondary | ICD-10-CM | POA: Diagnosis not present

## 2020-02-27 DIAGNOSIS — E119 Type 2 diabetes mellitus without complications: Secondary | ICD-10-CM | POA: Diagnosis not present
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
## 2020-02-27 NOTE — Telephone Encounter (Signed)
Pt called stating that she has decided to take zantac 20 mg / 1 tab qhs. Per pt, pharmacist told her that famotidine contains the same component ingredients as the zantac. Pt verbalized that she is aware that zantac may not be the best choice. However she is going to start taking the medication. She wanted provider to be aware. No other inquiries during the call.

## 2020-03-01 NOTE — Telephone Encounter (Signed)
The Zantac is a good choice.

## 2020-03-02 DIAGNOSIS — R350 Frequency of micturition: Secondary | ICD-10-CM | POA: Diagnosis not present

## 2020-03-02 DIAGNOSIS — R102 Pelvic and perineal pain: Secondary | ICD-10-CM | POA: Diagnosis not present

## 2020-03-02 DIAGNOSIS — C519 Malignant neoplasm of vulva, unspecified: Secondary | ICD-10-CM | POA: Diagnosis not present

## 2020-03-02 DIAGNOSIS — C55 Malignant neoplasm of uterus, part unspecified: Secondary | ICD-10-CM | POA: Diagnosis not present

## 2020-03-02 DIAGNOSIS — R8290 Unspecified abnormal findings in urine: Secondary | ICD-10-CM | POA: Diagnosis not present

## 2020-03-03 ENCOUNTER — Encounter: Payer: Self-pay | Admitting: Family Medicine

## 2020-03-03 DIAGNOSIS — E785 Hyperlipidemia, unspecified: Secondary | ICD-10-CM | POA: Diagnosis not present

## 2020-03-03 DIAGNOSIS — R1310 Dysphagia, unspecified: Secondary | ICD-10-CM | POA: Diagnosis not present

## 2020-03-03 DIAGNOSIS — E119 Type 2 diabetes mellitus without complications: Secondary | ICD-10-CM | POA: Diagnosis not present

## 2020-03-03 DIAGNOSIS — R0602 Shortness of breath: Secondary | ICD-10-CM | POA: Diagnosis not present

## 2020-03-03 DIAGNOSIS — R0601 Orthopnea: Secondary | ICD-10-CM | POA: Diagnosis not present

## 2020-03-03 DIAGNOSIS — Z87891 Personal history of nicotine dependence: Secondary | ICD-10-CM | POA: Diagnosis not present

## 2020-03-03 DIAGNOSIS — I10 Essential (primary) hypertension: Secondary | ICD-10-CM | POA: Diagnosis not present

## 2020-03-03 NOTE — Assessment & Plan Note (Signed)
Blood pressure still significantly elevated today but she reports that overall her blood pressures have actually been doing a little bit better at home.  We will continue to follow.  She should have an appointment coming up with cardiology soon.

## 2020-03-03 NOTE — Assessment & Plan Note (Signed)
Recommended a trial of ranitidine.  It is now back on the market in pill form.  Not sure if it is available as a liquid.  Certainly we could try to send over prescription to see if they would fill it but I am not sure.  But it is now over-the-counter again.

## 2020-03-05 ENCOUNTER — Ambulatory Visit: Payer: Medicare HMO | Admitting: Family Medicine

## 2020-03-05 DIAGNOSIS — Z8639 Personal history of other endocrine, nutritional and metabolic disease: Secondary | ICD-10-CM | POA: Diagnosis not present

## 2020-03-05 DIAGNOSIS — Z87891 Personal history of nicotine dependence: Secondary | ICD-10-CM | POA: Diagnosis not present

## 2020-03-05 DIAGNOSIS — R0602 Shortness of breath: Secondary | ICD-10-CM | POA: Diagnosis not present

## 2020-03-05 DIAGNOSIS — R9431 Abnormal electrocardiogram [ECG] [EKG]: Secondary | ICD-10-CM | POA: Diagnosis not present

## 2020-03-05 DIAGNOSIS — R1310 Dysphagia, unspecified: Secondary | ICD-10-CM | POA: Diagnosis not present

## 2020-03-05 DIAGNOSIS — J029 Acute pharyngitis, unspecified: Secondary | ICD-10-CM | POA: Diagnosis not present

## 2020-03-05 DIAGNOSIS — Z8679 Personal history of other diseases of the circulatory system: Secondary | ICD-10-CM | POA: Diagnosis not present

## 2020-03-05 DIAGNOSIS — J4521 Mild intermittent asthma with (acute) exacerbation: Secondary | ICD-10-CM | POA: Diagnosis not present

## 2020-03-05 LAB — PULMONARY FUNCTION TEST

## 2020-03-05 MED ORDER — LEVALBUTEROL HCL 1.25 MG/3ML IN NEBU: 1.2500 mg | INHALATION_SOLUTION | Freq: Four times a day (QID) | RESPIRATORY_TRACT | 0 refills | Status: DC | PRN

## 2020-03-07 DIAGNOSIS — R079 Chest pain, unspecified: Secondary | ICD-10-CM | POA: Diagnosis not present

## 2020-03-07 DIAGNOSIS — Z87891 Personal history of nicotine dependence: Secondary | ICD-10-CM | POA: Diagnosis not present

## 2020-03-07 DIAGNOSIS — F419 Anxiety disorder, unspecified: Secondary | ICD-10-CM | POA: Diagnosis not present

## 2020-03-07 DIAGNOSIS — R0602 Shortness of breath: Secondary | ICD-10-CM | POA: Diagnosis not present

## 2020-03-07 DIAGNOSIS — J029 Acute pharyngitis, unspecified: Secondary | ICD-10-CM | POA: Diagnosis not present

## 2020-03-07 DIAGNOSIS — R112 Nausea with vomiting, unspecified: Secondary | ICD-10-CM | POA: Diagnosis not present

## 2020-03-07 DIAGNOSIS — R9431 Abnormal electrocardiogram [ECG] [EKG]: Secondary | ICD-10-CM | POA: Diagnosis not present

## 2020-03-07 DIAGNOSIS — R000 Tachycardia, unspecified: Secondary | ICD-10-CM | POA: Diagnosis not present

## 2020-03-08 ENCOUNTER — Emergency Department (HOSPITAL_BASED_OUTPATIENT_CLINIC_OR_DEPARTMENT_OTHER)
Admission: EM | Admit: 2020-03-08 | Discharge: 2020-03-08 | Disposition: A | Discharge status: Home / Self Care | Payer: Medicare HMO | Attending: Emergency Medicine | Admitting: Emergency Medicine

## 2020-03-08 ENCOUNTER — Other Ambulatory Visit: Payer: Self-pay

## 2020-03-08 ENCOUNTER — Encounter (HOSPITAL_BASED_OUTPATIENT_CLINIC_OR_DEPARTMENT_OTHER): Payer: Self-pay

## 2020-03-08 ENCOUNTER — Telehealth: Payer: Self-pay | Admitting: Family

## 2020-03-08 DIAGNOSIS — Z886 Allergy status to analgesic agent status: Secondary | ICD-10-CM | POA: Diagnosis not present

## 2020-03-08 DIAGNOSIS — Z91010 Allergy to peanuts: Secondary | ICD-10-CM | POA: Diagnosis not present

## 2020-03-08 DIAGNOSIS — R0600 Dyspnea, unspecified: Secondary | ICD-10-CM | POA: Insufficient documentation

## 2020-03-08 DIAGNOSIS — R000 Tachycardia, unspecified: Secondary | ICD-10-CM | POA: Diagnosis not present

## 2020-03-08 DIAGNOSIS — Z79899 Other long term (current) drug therapy: Secondary | ICD-10-CM | POA: Diagnosis not present

## 2020-03-08 DIAGNOSIS — Z881 Allergy status to other antibiotic agents status: Secondary | ICD-10-CM | POA: Diagnosis not present

## 2020-03-08 DIAGNOSIS — Z888 Allergy status to other drugs, medicaments and biological substances status: Secondary | ICD-10-CM | POA: Diagnosis not present

## 2020-03-08 DIAGNOSIS — Z5321 Procedure and treatment not carried out due to patient leaving prior to being seen by health care provider: Secondary | ICD-10-CM | POA: Insufficient documentation

## 2020-03-08 DIAGNOSIS — J45909 Unspecified asthma, uncomplicated: Secondary | ICD-10-CM | POA: Diagnosis not present

## 2020-03-08 DIAGNOSIS — Z9109 Other allergy status, other than to drugs and biological substances: Secondary | ICD-10-CM | POA: Diagnosis not present

## 2020-03-08 DIAGNOSIS — Z91018 Allergy to other foods: Secondary | ICD-10-CM | POA: Diagnosis not present

## 2020-03-08 DIAGNOSIS — Z885 Allergy status to narcotic agent status: Secondary | ICD-10-CM | POA: Diagnosis not present

## 2020-03-08 DIAGNOSIS — R0602 Shortness of breath: Secondary | ICD-10-CM | POA: Diagnosis not present

## 2020-03-08 DIAGNOSIS — R1310 Dysphagia, unspecified: Secondary | ICD-10-CM | POA: Diagnosis not present

## 2020-03-08 DIAGNOSIS — I517 Cardiomegaly: Secondary | ICD-10-CM | POA: Diagnosis not present

## 2020-03-08 NOTE — Telephone Encounter (Signed)
Can you please ask her if she is having any fever and what is the color of her sputum?

## 2020-03-08 NOTE — Telephone Encounter (Signed)
Pt is refusing prednisolone and is okay with with referral to pulmonology pt is currently headed to [facility] to be reevaluated and see if there is anything different they can do for her.

## 2020-03-08 NOTE — Telephone Encounter (Signed)
Patient would like a call back from nurse as soon as possible.

## 2020-03-08 NOTE — Telephone Encounter (Signed)
No fever that she is aware of and sputum is clear occ greenish

## 2020-03-08 NOTE — Telephone Encounter (Signed)
Pt called stating she feels like her lungs are inflated she is coughing up some mucus along with chest congest. She is not doing nebulizer or mucinex. Pt would like a call back she would first like you to look at the chest xray results from July 16th and discuss this with her as it stated on there she had mild bilateral basilar density.

## 2020-03-08 NOTE — Telephone Encounter (Signed)
Noted! Thank you

## 2020-03-08 NOTE — Telephone Encounter (Signed)
Please let her know that her xray from 02/27/2020 shows atelectasis. If her symptoms worsen or if she develops a fever she needs to seek medical attention. If she is willing she can try prednisolone 15 mg/56ml doing 8 ml twice a day for 3 days, then 8 ml once a day for 2 days, then 4 ml once a day for 2 days then stop. Also, please have her use Xopenex 2 puffs every 6 hours as needed for cough, wheeze, tightness in chest or shortness of breath. It would be good for her to get a second opinion for her asthma since she is having so many complications. Please refer to Dr. Marshell Garfinkel. He is good with treating difficult to treat asthma.

## 2020-03-08 NOTE — ED Triage Notes (Signed)
Difficulty taking deep breaths, xray the 16th and was told she had a partially collapsed lung, was advised to be evaluated in ER. 100% RA in triage.

## 2020-03-08 NOTE — Telephone Encounter (Signed)
Lm for pt to call us back  

## 2020-03-09 ENCOUNTER — Telehealth: Payer: Self-pay | Admitting: Family Medicine

## 2020-03-09 DIAGNOSIS — R0602 Shortness of breath: Secondary | ICD-10-CM | POA: Diagnosis not present

## 2020-03-09 DIAGNOSIS — R451 Restlessness and agitation: Secondary | ICD-10-CM | POA: Diagnosis not present

## 2020-03-09 DIAGNOSIS — M549 Dorsalgia, unspecified: Secondary | ICD-10-CM | POA: Diagnosis not present

## 2020-03-09 DIAGNOSIS — R9431 Abnormal electrocardiogram [ECG] [EKG]: Secondary | ICD-10-CM | POA: Diagnosis not present

## 2020-03-09 DIAGNOSIS — R000 Tachycardia, unspecified: Secondary | ICD-10-CM | POA: Diagnosis not present

## 2020-03-09 DIAGNOSIS — F419 Anxiety disorder, unspecified: Secondary | ICD-10-CM | POA: Diagnosis not present

## 2020-03-09 DIAGNOSIS — R61 Generalized hyperhidrosis: Secondary | ICD-10-CM | POA: Diagnosis not present

## 2020-03-09 DIAGNOSIS — R0789 Other chest pain: Secondary | ICD-10-CM | POA: Diagnosis not present

## 2020-03-09 NOTE — Telephone Encounter (Signed)
Faxed a letter to [facility] to get the results from the methacholine challenge sent to [place] their fax number is [phone]

## 2020-03-09 NOTE — Telephone Encounter (Signed)
Jennifer [name]  deleted her appointment on 03/11/20 on accident. Looks like the slots were taken back up. She says she needs to been seen this week.   Please advise.

## 2020-03-09 NOTE — Telephone Encounter (Signed)
Pt was wondering if we had the methacholine challenge I looked and we still do not have results even though it said they are released but we still do not have them I will call and see about getting results faxed over

## 2020-03-09 NOTE — Telephone Encounter (Signed)
Pt. Requests [name] to call her back with lab results

## 2020-03-10 ENCOUNTER — Telehealth: Payer: Self-pay | Admitting: Family Medicine

## 2020-03-10 ENCOUNTER — Other Ambulatory Visit: Payer: Self-pay

## 2020-03-10 DIAGNOSIS — I517 Cardiomegaly: Secondary | ICD-10-CM | POA: Diagnosis not present

## 2020-03-10 DIAGNOSIS — R000 Tachycardia, unspecified: Secondary | ICD-10-CM | POA: Diagnosis not present

## 2020-03-10 MED ORDER — SPIRIVA RESPIMAT 1.25 MCG/ACT IN AERS
2.0000 | INHALATION_SPRAY | Freq: Every day | RESPIRATORY_TRACT | 3 refills | Status: DC
Start: 2020-03-10 — End: 2020-04-02

## 2020-03-10 NOTE — Telephone Encounter (Signed)
Left vm on patients voicemail. Thank you!

## 2020-03-10 NOTE — Telephone Encounter (Signed)
Call pt: see if can come tomorrow at 11:30

## 2020-03-10 NOTE — Telephone Encounter (Signed)
Called and spoke to patient per Chrissie's request and she has been informed that her methacholine showed that she does have asthma and if she is willing she can try Spiriva Respimat 1.25mg  2 puffs once a day. That inhaler should not cause thrush. Also, Chrissie would like for patient to be referred to Dr. Marshell Garfinkel for asthma. Patient wanted to know if the Spiriva was safe for her due to her heart condition and she would like to see a pulmonologist in [name]. I spoke to both Chrissie and Dr. Ernst Bowler and they stated the Spiriva should be fine for her to take but if she has any issues to stop taking it and give [place] a call back. Chrissie would like for patient to be referred to Dr. Camillo Flaming in [facility]. Left a detailed message informing patient and to give [place] a callback if there's any issues.

## 2020-03-11 ENCOUNTER — Telehealth: Payer: Medicare HMO | Admitting: Family Medicine

## 2020-03-11 ENCOUNTER — Other Ambulatory Visit: Payer: Self-pay

## 2020-03-11 ENCOUNTER — Encounter: Payer: Self-pay | Admitting: Family Medicine

## 2020-03-11 ENCOUNTER — Telehealth (HOSPITAL_BASED_OUTPATIENT_CLINIC_OR_DEPARTMENT_OTHER): Payer: Self-pay

## 2020-03-11 ENCOUNTER — Ambulatory Visit: Payer: Medicare HMO | Admitting: Family Medicine

## 2020-03-11 ENCOUNTER — Encounter (HOSPITAL_BASED_OUTPATIENT_CLINIC_OR_DEPARTMENT_OTHER): Payer: Self-pay

## 2020-03-11 ENCOUNTER — Emergency Department (HOSPITAL_BASED_OUTPATIENT_CLINIC_OR_DEPARTMENT_OTHER)
Admission: EM | Admit: 2020-03-11 | Discharge: 2020-03-11 | Disposition: A | Discharge status: Home / Self Care | Payer: Medicare HMO | Attending: Emergency Medicine | Admitting: Emergency Medicine

## 2020-03-11 ENCOUNTER — Telehealth: Payer: Self-pay

## 2020-03-11 ENCOUNTER — Emergency Department (HOSPITAL_BASED_OUTPATIENT_CLINIC_OR_DEPARTMENT_OTHER): Payer: Medicare HMO

## 2020-03-11 DIAGNOSIS — Z87891 Personal history of nicotine dependence: Secondary | ICD-10-CM | POA: Insufficient documentation

## 2020-03-11 DIAGNOSIS — J069 Acute upper respiratory infection, unspecified: Secondary | ICD-10-CM | POA: Diagnosis not present

## 2020-03-11 DIAGNOSIS — Z91010 Allergy to peanuts: Secondary | ICD-10-CM | POA: Insufficient documentation

## 2020-03-11 DIAGNOSIS — R079 Chest pain, unspecified: Secondary | ICD-10-CM | POA: Diagnosis not present

## 2020-03-11 DIAGNOSIS — R0602 Shortness of breath: Secondary | ICD-10-CM | POA: Diagnosis not present

## 2020-03-11 DIAGNOSIS — B9789 Other viral agents as the cause of diseases classified elsewhere: Secondary | ICD-10-CM | POA: Diagnosis not present

## 2020-03-11 DIAGNOSIS — I10 Essential (primary) hypertension: Secondary | ICD-10-CM | POA: Insufficient documentation

## 2020-03-11 DIAGNOSIS — U071 COVID-19: Secondary | ICD-10-CM | POA: Insufficient documentation

## 2020-03-11 DIAGNOSIS — R000 Tachycardia, unspecified: Secondary | ICD-10-CM | POA: Diagnosis not present

## 2020-03-11 DIAGNOSIS — J45909 Unspecified asthma, uncomplicated: Secondary | ICD-10-CM | POA: Diagnosis not present

## 2020-03-11 DIAGNOSIS — Z79899 Other long term (current) drug therapy: Secondary | ICD-10-CM | POA: Diagnosis not present

## 2020-03-11 DIAGNOSIS — F419 Anxiety disorder, unspecified: Secondary | ICD-10-CM | POA: Diagnosis not present

## 2020-03-11 DIAGNOSIS — I517 Cardiomegaly: Secondary | ICD-10-CM | POA: Diagnosis not present

## 2020-03-11 DIAGNOSIS — R0789 Other chest pain: Secondary | ICD-10-CM | POA: Diagnosis not present

## 2020-03-11 DIAGNOSIS — R05 Cough: Secondary | ICD-10-CM | POA: Diagnosis not present

## 2020-03-11 DIAGNOSIS — R059 Cough, unspecified: Secondary | ICD-10-CM

## 2020-03-11 LAB — SARS CORONAVIRUS 2 BY RT PCR (HOSPITAL ORDER, PERFORMED IN ~~LOC~~ HOSPITAL LAB): SARS Coronavirus 2: POSITIVE — AB

## 2020-03-11 NOTE — Telephone Encounter (Signed)
Please let her know that we unfortunately cannot do another test, because it would not cancel out the positive test. She needs to follow the CDC's guidelines for quarantining.

## 2020-03-11 NOTE — Telephone Encounter (Signed)
Pt called to cancel her appt on 03/12/20 due to testing positive for Covid.
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
## 2020-03-11 NOTE — Telephone Encounter (Signed)
This RN called pt to notify her of her positive Covid result over the phone. Encouraged pt to isolate at home and tell others that she had been around that she was Covid positive. Pt agrees.

## 2020-03-11 NOTE — ED Notes (Signed)
Pt refused IV; attempted lab draw x 2 without success.

## 2020-03-11 NOTE — Telephone Encounter (Signed)
Pt called Triage, but did not disclose a reason for her call. She requested to speak with Levada Dy and Kenney Houseman. Informed both assistants were out of the office today. She then asked to speak with provider. Aware that provider is currently seeing patients this afternoon. She was crying during our brief phone call. Pt ended the call abruptly. She then sent 2 MyChart messages. Forwarding to provider, unsure how to help the pt.

## 2020-03-11 NOTE — Telephone Encounter (Signed)
Patient called upset about her positive COVID test she had today. She states that it was negative on Friday and positive today. She isn't trusting the results of the test and would like for [place] to put another order in to be retested. Please advise.

## 2020-03-11 NOTE — Progress Notes (Signed)
Patient asked to speak to RT about Spiriva MDI that was given to her by PCP. I helped her put MDI together, and then instructed her on how to do it. She stated that she does not wish to take MDI at this time, just wanted to be instructed. Will answer any further questions if needed. No distress noted.

## 2020-03-11 NOTE — ED Triage Notes (Signed)
Pt arrives with c/o chronic cough and SOB states she has a pulmonologist and has been diagnosed with asthma. Pt speaking in full sentences in triage. States she feel like everything "is in the right lung" denies CP.

## 2020-03-11 NOTE — Discharge Instructions (Signed)
Follow-up with PCP Take your asthma medications as prescribed as we taught you today Your Covid test will result on my chart, if it is positive follow CDC guidelines.

## 2020-03-12 ENCOUNTER — Telehealth: Payer: Self-pay | Admitting: Physician Assistant

## 2020-03-12 ENCOUNTER — Inpatient Hospital Stay: Payer: Medicare HMO | Admitting: Gynecologic Oncology

## 2020-03-12 DIAGNOSIS — D6869 Other thrombophilia: Secondary | ICD-10-CM | POA: Diagnosis not present

## 2020-03-12 DIAGNOSIS — R197 Diarrhea, unspecified: Secondary | ICD-10-CM | POA: Diagnosis not present

## 2020-03-12 DIAGNOSIS — C4499 Other specified malignant neoplasm of skin, unspecified: Secondary | ICD-10-CM

## 2020-03-12 DIAGNOSIS — Z91010 Allergy to peanuts: Secondary | ICD-10-CM | POA: Diagnosis not present

## 2020-03-12 DIAGNOSIS — I517 Cardiomegaly: Secondary | ICD-10-CM | POA: Diagnosis not present

## 2020-03-12 DIAGNOSIS — J45909 Unspecified asthma, uncomplicated: Secondary | ICD-10-CM | POA: Diagnosis not present

## 2020-03-12 DIAGNOSIS — R0602 Shortness of breath: Secondary | ICD-10-CM | POA: Diagnosis not present

## 2020-03-12 DIAGNOSIS — J3489 Other specified disorders of nose and nasal sinuses: Secondary | ICD-10-CM | POA: Diagnosis not present

## 2020-03-12 DIAGNOSIS — F329 Major depressive disorder, single episode, unspecified: Secondary | ICD-10-CM | POA: Diagnosis not present

## 2020-03-12 DIAGNOSIS — R0902 Hypoxemia: Secondary | ICD-10-CM | POA: Diagnosis not present

## 2020-03-12 DIAGNOSIS — D696 Thrombocytopenia, unspecified: Secondary | ICD-10-CM | POA: Diagnosis not present

## 2020-03-12 DIAGNOSIS — F419 Anxiety disorder, unspecified: Secondary | ICD-10-CM | POA: Diagnosis not present

## 2020-03-12 DIAGNOSIS — R9431 Abnormal electrocardiogram [ECG] [EKG]: Secondary | ICD-10-CM | POA: Diagnosis not present

## 2020-03-12 DIAGNOSIS — M25519 Pain in unspecified shoulder: Secondary | ICD-10-CM | POA: Diagnosis not present

## 2020-03-12 DIAGNOSIS — R102 Pelvic and perineal pain: Secondary | ICD-10-CM | POA: Diagnosis not present

## 2020-03-12 DIAGNOSIS — J9600 Acute respiratory failure, unspecified whether with hypoxia or hypercapnia: Secondary | ICD-10-CM | POA: Diagnosis not present

## 2020-03-12 DIAGNOSIS — Z888 Allergy status to other drugs, medicaments and biological substances status: Secondary | ICD-10-CM | POA: Diagnosis not present

## 2020-03-12 DIAGNOSIS — I10 Essential (primary) hypertension: Secondary | ICD-10-CM | POA: Diagnosis not present

## 2020-03-12 DIAGNOSIS — Z881 Allergy status to other antibiotic agents status: Secondary | ICD-10-CM | POA: Diagnosis not present

## 2020-03-12 DIAGNOSIS — F411 Generalized anxiety disorder: Secondary | ICD-10-CM | POA: Diagnosis not present

## 2020-03-12 DIAGNOSIS — J80 Acute respiratory distress syndrome: Secondary | ICD-10-CM | POA: Diagnosis not present

## 2020-03-12 DIAGNOSIS — R110 Nausea: Secondary | ICD-10-CM | POA: Diagnosis not present

## 2020-03-12 DIAGNOSIS — E119 Type 2 diabetes mellitus without complications: Secondary | ICD-10-CM | POA: Diagnosis not present

## 2020-03-12 DIAGNOSIS — A4189 Other specified sepsis: Secondary | ICD-10-CM | POA: Diagnosis not present

## 2020-03-12 DIAGNOSIS — J9811 Atelectasis: Secondary | ICD-10-CM | POA: Diagnosis not present

## 2020-03-12 DIAGNOSIS — Z9981 Dependence on supplemental oxygen: Secondary | ICD-10-CM | POA: Diagnosis not present

## 2020-03-12 DIAGNOSIS — R7401 Elevation of levels of liver transaminase levels: Secondary | ICD-10-CM | POA: Diagnosis not present

## 2020-03-12 DIAGNOSIS — U071 COVID-19: Secondary | ICD-10-CM | POA: Diagnosis not present

## 2020-03-12 DIAGNOSIS — I471 Supraventricular tachycardia: Secondary | ICD-10-CM | POA: Diagnosis not present

## 2020-03-12 DIAGNOSIS — R42 Dizziness and giddiness: Secondary | ICD-10-CM | POA: Diagnosis not present

## 2020-03-12 DIAGNOSIS — Z789 Other specified health status: Secondary | ICD-10-CM | POA: Diagnosis not present

## 2020-03-12 DIAGNOSIS — R439 Unspecified disturbances of smell and taste: Secondary | ICD-10-CM | POA: Diagnosis not present

## 2020-03-12 DIAGNOSIS — J1282 Pneumonia due to coronavirus disease 2019: Secondary | ICD-10-CM | POA: Diagnosis not present

## 2020-03-12 DIAGNOSIS — R112 Nausea with vomiting, unspecified: Secondary | ICD-10-CM | POA: Diagnosis not present

## 2020-03-12 DIAGNOSIS — R519 Headache, unspecified: Secondary | ICD-10-CM | POA: Diagnosis not present

## 2020-03-12 DIAGNOSIS — J90 Pleural effusion, not elsewhere classified: Secondary | ICD-10-CM | POA: Diagnosis not present

## 2020-03-12 DIAGNOSIS — R1110 Vomiting, unspecified: Secondary | ICD-10-CM | POA: Diagnosis not present

## 2020-03-12 DIAGNOSIS — R1084 Generalized abdominal pain: Secondary | ICD-10-CM | POA: Diagnosis not present

## 2020-03-12 DIAGNOSIS — J9601 Acute respiratory failure with hypoxia: Secondary | ICD-10-CM | POA: Diagnosis not present

## 2020-03-12 DIAGNOSIS — R0789 Other chest pain: Secondary | ICD-10-CM | POA: Diagnosis not present

## 2020-03-12 DIAGNOSIS — G4733 Obstructive sleep apnea (adult) (pediatric): Secondary | ICD-10-CM | POA: Diagnosis not present

## 2020-03-12 DIAGNOSIS — I499 Cardiac arrhythmia, unspecified: Secondary | ICD-10-CM | POA: Diagnosis not present

## 2020-03-12 DIAGNOSIS — D72819 Decreased white blood cell count, unspecified: Secondary | ICD-10-CM | POA: Diagnosis not present

## 2020-03-12 DIAGNOSIS — R5383 Other fatigue: Secondary | ICD-10-CM | POA: Diagnosis not present

## 2020-03-12 DIAGNOSIS — R079 Chest pain, unspecified: Secondary | ICD-10-CM | POA: Diagnosis not present

## 2020-03-12 DIAGNOSIS — R000 Tachycardia, unspecified: Secondary | ICD-10-CM | POA: Diagnosis not present

## 2020-03-12 DIAGNOSIS — R069 Unspecified abnormalities of breathing: Secondary | ICD-10-CM | POA: Diagnosis not present

## 2020-03-12 DIAGNOSIS — A419 Sepsis, unspecified organism: Secondary | ICD-10-CM | POA: Diagnosis not present

## 2020-03-12 DIAGNOSIS — R6521 Severe sepsis with septic shock: Secondary | ICD-10-CM | POA: Diagnosis not present

## 2020-03-12 DIAGNOSIS — Z87891 Personal history of nicotine dependence: Secondary | ICD-10-CM | POA: Diagnosis not present

## 2020-03-12 DIAGNOSIS — R748 Abnormal levels of other serum enzymes: Secondary | ICD-10-CM | POA: Diagnosis not present

## 2020-03-12 DIAGNOSIS — R509 Fever, unspecified: Secondary | ICD-10-CM | POA: Diagnosis not present

## 2020-03-12 DIAGNOSIS — J984 Other disorders of lung: Secondary | ICD-10-CM | POA: Diagnosis not present

## 2020-03-12 DIAGNOSIS — F319 Bipolar disorder, unspecified: Secondary | ICD-10-CM | POA: Diagnosis not present

## 2020-03-12 DIAGNOSIS — J189 Pneumonia, unspecified organism: Secondary | ICD-10-CM | POA: Diagnosis not present

## 2020-03-12 DIAGNOSIS — Z7409 Other reduced mobility: Secondary | ICD-10-CM | POA: Diagnosis not present

## 2020-03-12 DIAGNOSIS — R451 Restlessness and agitation: Secondary | ICD-10-CM | POA: Diagnosis not present

## 2020-03-12 DIAGNOSIS — R52 Pain, unspecified: Secondary | ICD-10-CM | POA: Diagnosis not present

## 2020-03-12 DIAGNOSIS — Z6841 Body Mass Index (BMI) 40.0 and over, adult: Secondary | ICD-10-CM | POA: Diagnosis not present
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
## 2020-03-12 NOTE — Telephone Encounter (Signed)
Patient called and given message from Althea Charon, [place].

## 2020-03-12 NOTE — Telephone Encounter (Signed)
Called to discuss with patient about Covid symptoms and the use of bamlanivimab/etesevimab or casirivimab/imdevimab, a monoclonal antibody infusion for those with mild to moderate Covid symptoms and at a high risk of hospitalization.  Pt is qualified for this infusion at the [facility] center due to Hypertension, CVD, cancer   Message left to call back our hotline [phone] and sent mychart message.  Angelena Form PA-C  [facility]

## 2020-03-13 DIAGNOSIS — R0789 Other chest pain: Secondary | ICD-10-CM | POA: Diagnosis not present

## 2020-03-13 DIAGNOSIS — J9811 Atelectasis: Secondary | ICD-10-CM | POA: Diagnosis not present

## 2020-03-13 DIAGNOSIS — R112 Nausea with vomiting, unspecified: Secondary | ICD-10-CM | POA: Diagnosis not present

## 2020-03-13 DIAGNOSIS — R197 Diarrhea, unspecified: Secondary | ICD-10-CM | POA: Diagnosis not present

## 2020-03-13 DIAGNOSIS — R5383 Other fatigue: Secondary | ICD-10-CM | POA: Diagnosis not present

## 2020-03-13 DIAGNOSIS — Z87891 Personal history of nicotine dependence: Secondary | ICD-10-CM | POA: Diagnosis not present

## 2020-03-13 DIAGNOSIS — R0602 Shortness of breath: Secondary | ICD-10-CM | POA: Diagnosis not present

## 2020-03-13 DIAGNOSIS — R000 Tachycardia, unspecified: Secondary | ICD-10-CM | POA: Diagnosis not present

## 2020-03-13 DIAGNOSIS — Z888 Allergy status to other drugs, medicaments and biological substances status: Secondary | ICD-10-CM | POA: Diagnosis not present

## 2020-03-13 DIAGNOSIS — E119 Type 2 diabetes mellitus without complications: Secondary | ICD-10-CM | POA: Diagnosis not present

## 2020-03-13 DIAGNOSIS — U071 COVID-19: Secondary | ICD-10-CM | POA: Diagnosis not present

## 2020-03-13 DIAGNOSIS — J984 Other disorders of lung: Secondary | ICD-10-CM | POA: Diagnosis not present

## 2020-03-13 DIAGNOSIS — J3489 Other specified disorders of nose and nasal sinuses: Secondary | ICD-10-CM | POA: Diagnosis not present

## 2020-03-13 DIAGNOSIS — Z91010 Allergy to peanuts: Secondary | ICD-10-CM | POA: Diagnosis not present

## 2020-03-13 DIAGNOSIS — R519 Headache, unspecified: Secondary | ICD-10-CM | POA: Diagnosis not present

## 2020-03-13 DIAGNOSIS — Z881 Allergy status to other antibiotic agents status: Secondary | ICD-10-CM | POA: Diagnosis not present

## 2020-03-13 DIAGNOSIS — R079 Chest pain, unspecified: Secondary | ICD-10-CM | POA: Diagnosis not present

## 2020-03-15 ENCOUNTER — Telehealth (INDEPENDENT_AMBULATORY_CARE_PROVIDER_SITE_OTHER): Payer: Medicare HMO | Admitting: Family Medicine

## 2020-03-15 ENCOUNTER — Other Ambulatory Visit: Payer: Self-pay

## 2020-03-15 ENCOUNTER — Telehealth: Payer: Self-pay

## 2020-03-15 DIAGNOSIS — Z91199 Patient's noncompliance with other medical treatment and regimen due to unspecified reason: Secondary | ICD-10-CM

## 2020-03-15 DIAGNOSIS — Z5329 Procedure and treatment not carried out because of patient's decision for other reasons: Secondary | ICD-10-CM

## 2020-03-15 NOTE — Telephone Encounter (Signed)
Patient was called to start Mychart video visit, patient states she was being admitted into the hospital due to high fever and difficulty breathing. Patient wanted to know when will she be able to get the covid vaccine. Please advise, Thanks

## 2020-03-15 NOTE — Telephone Encounter (Signed)
Problem resolved

## 2020-03-15 NOTE — Telephone Encounter (Signed)
Thank you :)

## 2020-03-15 NOTE — Telephone Encounter (Signed)
She can have the vaccines  30 days after having had Covid.

## 2020-03-15 NOTE — Progress Notes (Signed)
Patient was called to start Mychart video visit, patient states she was being admitted into the hospital due to high fever and difficulty breathing. Patient wanted to know when will she be able to get the covid vaccine  Beatrice Lecher, MD

## 2020-03-15 NOTE — Telephone Encounter (Signed)
Patient is schedule to see Dr Jones Broom on 05/11/20 @ 1:40. Dr Camillo Flaming does not see asthma patients. The [place] office does.  I have sent the patient a Mychart message with this information.   Thanks

## 2020-03-18 NOTE — Telephone Encounter (Signed)
error 

## 2020-03-19 ENCOUNTER — Telehealth (INDEPENDENT_AMBULATORY_CARE_PROVIDER_SITE_OTHER): Payer: Medicare HMO | Admitting: Family Medicine

## 2020-03-19 ENCOUNTER — Encounter: Payer: Self-pay | Admitting: Family Medicine

## 2020-03-19 ENCOUNTER — Telehealth: Payer: Self-pay | Admitting: Family Medicine

## 2020-03-19 ENCOUNTER — Ambulatory Visit: Payer: Medicare HMO | Admitting: Family Medicine

## 2020-03-19 DIAGNOSIS — Z8616 Personal history of COVID-19: Secondary | ICD-10-CM

## 2020-03-19 DIAGNOSIS — R748 Abnormal levels of other serum enzymes: Secondary | ICD-10-CM

## 2020-03-19 DIAGNOSIS — U071 COVID-19: Secondary | ICD-10-CM | POA: Diagnosis not present

## 2020-03-19 DIAGNOSIS — J1282 Pneumonia due to coronavirus disease 2019: Secondary | ICD-10-CM

## 2020-03-19 HISTORY — DX: Personal history of COVID-19: Z86.16

## 2020-03-19 NOTE — Telephone Encounter (Signed)
Appointment has been made. Left patient a voicemail.

## 2020-03-19 NOTE — Telephone Encounter (Signed)
Jennifer [name] what should we refer her for? I am not to sure on this one.

## 2020-03-19 NOTE — Telephone Encounter (Signed)
Patient calling in stating that she didn't know why her appointment was taken off, in the notes I show it says per PCP. I let patient know this and she said no one told her and its not fair without her permission. She is in the hospital. She wants a virtual today for 10-15 minutes. I let her know we didn't have anything but she wanted me to send a message.

## 2020-03-19 NOTE — Progress Notes (Signed)
Virtual Visit via Video Note  I connected with Jennifer [name] on 03/19/20 at  4:20 PM EDT by a video enabled telemedicine application and verified that I am speaking with the correct person using two identifiers.   I discussed the limitations of evaluation and management by telemedicine and the availability of in person appointments. The patient expressed understanding and agreed to proceed.  Patient location: in the hospital Provider location: in office  Subjective:    CC: COVID  HPI:  Jennifer [name] is a 54 year old female who was originally diagnosed with Covid pneumonia on 729.  She had actually been to the emergency department a couple times earlier for shortness of breath and feeling like she was having an asthma exacerbation.  She thinks she had already been sick for about a week or more before she developed a fever and again went to the emergency department and tested positive.  She ended up going back to the ED on August 2 and was admitted for Covid.  She says initially she was actually doing well without oxygen but then started requiring oxygen she is currently on 2 L.  They were doing remdesivir infusions and she had 3 of them but her liver enzymes have been elevating so they stopped the infusion 2 days ago her enzymes continue to elevate this morning.  So they are keeping her overnight and we will recheck them in the morning.  She starting to do a little bit better taking in food and hydrating.  She is really struggling emotionally with what is going on.  And just wants to know that we will be able to help support her through all of this afterwards.  Past medical history, Surgical history, Family history not pertinant except as noted below, Social history, Allergies, and medications have been entered into the medical record, reviewed, and corrections made.   Review of Systems: No fevers, chills, night sweats, weight loss, chest pain, or shortness of breath.   Objective:    General:  Speaking clearly in complete sentences without any shortness of breath.  Alert and oriented x3.  Normal judgment. No apparent acute distress.    Impression and Recommendations:    No problem-specific Assessment & Plan notes found for this encounter.  Covid 19 pneumonia-still hospitalized.  She is hoping she will be discharged this weekend.  We discussed that we can certainly do a follow-up on Monday either in person or virtually depending on how she is feeling at that point she will be have been at least 10 days out from diagnosis in fact she probably had symptoms for about a week before she was actually hospitalized on the second.  She is feeling a little better today she says is the first day she is started to feel better she is starting to eat a little bit more she still on 2 L.  She would also like to get vaccinated when she is able to.  Discussed that she will need to wait at least 30 days before vaccination.  Elevated liver enzymes-discussed diagnosis and looked at her values.  See values below.  Hopefully this will start to trend downward.  Discussed that we will likely need to follow these as an outpatient.  Comprehensive Metabolic Panel Specimen:  Blood  Ref Range & Units Today Comments  Sodium 135 - 146 MMOL/L 142        Potassium 3.5 - 5.3 MMOL/L 3.8        Chloride 98 - 110 MMOL/L 110  CO2 23 - 30 MMOL/L 25        BUN 8 - 24 MG/DL 15        Glucose 70 - 99 MG/DL 161High  Patients taking eltrombopag at doses >/= 100 mg daily may show falsely elevated values of 10% or greater.      Creatinine 0.50 - 1.50 MG/DL 0.55        Calcium 8.5 - 10.5 MG/DL 8.3Low        Total Protein 6.0 - 8.3 G/DL 6.1  Patients taking eltrombopag at doses >/= 100 mg daily may show falsely elevated values of 10% or greater.      Albumin  3.5 - 5.0 G/DL 3.3Low        Total Bilirubin 0.1 - 1.2 MG/DL 0.4  Patients taking eltrombopag at doses >/= 100 mg daily may show falsely elevated  values of 10% or greater.      Alkaline Phosphatase 25 - 125 IU/L or U/L 54        AST (SGOT) 5 - 40 IU/L or U/L 171High        ALT (SGPT) 5 - 50 IU/L or U/L 186High        Anion Gap 4 - 14 MMOL/L 7        Est. GFR Non-African American >=60 ML/MIN/1.73 M*2  >=90  GFR estimated by CKD-EPI equations, reportable up to 90 ML/MIN/1.73 [place]   Specimen Collected: 03/19/20 2:25 AM Last Resulted: 03/19/20 2:53       Time spent in encounter 30 minutes  I discussed the assessment and treatment plan with the patient. The patient was provided an opportunity to ask questions and all were answered. The patient agreed with the plan and demonstrated an understanding of the instructions.   The patient was advised to call back or seek an in-person evaluation if the symptoms worsen or if the condition fails to improve as anticipated.   Beatrice Lecher, MD

## 2020-03-19 NOTE — Progress Notes (Signed)
Pt reports that she is still in the hospital. She said that her labs are not normal and liver function is elevated.

## 2020-03-19 NOTE — Telephone Encounter (Signed)
Ok to schedule for vitual. I just didn't want her to be a NO SHOW since still in hosp

## 2020-03-19 NOTE — Telephone Encounter (Signed)
Please refer to pulmonary difficult to treat asthma with a history of smoking. See if there is a pulmonologist in [facility]

## 2020-03-21 DIAGNOSIS — R451 Restlessness and agitation: Secondary | ICD-10-CM | POA: Diagnosis not present

## 2020-03-21 DIAGNOSIS — A4189 Other specified sepsis: Secondary | ICD-10-CM | POA: Diagnosis not present

## 2020-03-21 DIAGNOSIS — I10 Essential (primary) hypertension: Secondary | ICD-10-CM | POA: Diagnosis not present

## 2020-03-21 DIAGNOSIS — Z9981 Dependence on supplemental oxygen: Secondary | ICD-10-CM | POA: Diagnosis not present

## 2020-03-21 DIAGNOSIS — D6869 Other thrombophilia: Secondary | ICD-10-CM | POA: Diagnosis not present

## 2020-03-21 DIAGNOSIS — F411 Generalized anxiety disorder: Secondary | ICD-10-CM | POA: Diagnosis not present

## 2020-03-21 DIAGNOSIS — R6521 Severe sepsis with septic shock: Secondary | ICD-10-CM | POA: Diagnosis not present

## 2020-03-21 DIAGNOSIS — F319 Bipolar disorder, unspecified: Secondary | ICD-10-CM | POA: Diagnosis not present

## 2020-03-21 DIAGNOSIS — R1084 Generalized abdominal pain: Secondary | ICD-10-CM | POA: Diagnosis not present

## 2020-03-21 DIAGNOSIS — R069 Unspecified abnormalities of breathing: Secondary | ICD-10-CM | POA: Diagnosis not present

## 2020-03-21 DIAGNOSIS — J1282 Pneumonia due to coronavirus disease 2019: Secondary | ICD-10-CM | POA: Diagnosis not present

## 2020-03-21 DIAGNOSIS — J9811 Atelectasis: Secondary | ICD-10-CM | POA: Diagnosis not present

## 2020-03-21 DIAGNOSIS — Z6841 Body Mass Index (BMI) 40.0 and over, adult: Secondary | ICD-10-CM | POA: Diagnosis not present

## 2020-03-21 DIAGNOSIS — J9600 Acute respiratory failure, unspecified whether with hypoxia or hypercapnia: Secondary | ICD-10-CM | POA: Diagnosis not present

## 2020-03-21 DIAGNOSIS — I517 Cardiomegaly: Secondary | ICD-10-CM | POA: Diagnosis not present

## 2020-03-21 DIAGNOSIS — I471 Supraventricular tachycardia: Secondary | ICD-10-CM | POA: Diagnosis not present

## 2020-03-21 DIAGNOSIS — J9601 Acute respiratory failure with hypoxia: Secondary | ICD-10-CM | POA: Diagnosis not present

## 2020-03-21 DIAGNOSIS — U071 COVID-19: Secondary | ICD-10-CM | POA: Diagnosis not present

## 2020-03-21 DIAGNOSIS — Z7409 Other reduced mobility: Secondary | ICD-10-CM | POA: Diagnosis not present

## 2020-03-21 DIAGNOSIS — J189 Pneumonia, unspecified organism: Secondary | ICD-10-CM | POA: Diagnosis not present

## 2020-03-21 DIAGNOSIS — A419 Sepsis, unspecified organism: Secondary | ICD-10-CM | POA: Diagnosis not present

## 2020-03-21 DIAGNOSIS — R000 Tachycardia, unspecified: Secondary | ICD-10-CM | POA: Diagnosis not present

## 2020-03-21 DIAGNOSIS — F419 Anxiety disorder, unspecified: Secondary | ICD-10-CM | POA: Diagnosis not present

## 2020-03-21 DIAGNOSIS — F329 Major depressive disorder, single episode, unspecified: Secondary | ICD-10-CM | POA: Diagnosis not present

## 2020-03-21 DIAGNOSIS — J45909 Unspecified asthma, uncomplicated: Secondary | ICD-10-CM | POA: Diagnosis not present

## 2020-03-21 DIAGNOSIS — R0602 Shortness of breath: Secondary | ICD-10-CM | POA: Diagnosis not present

## 2020-03-21 DIAGNOSIS — R52 Pain, unspecified: Secondary | ICD-10-CM | POA: Diagnosis not present

## 2020-03-21 DIAGNOSIS — J80 Acute respiratory distress syndrome: Secondary | ICD-10-CM | POA: Diagnosis not present

## 2020-03-21 DIAGNOSIS — Z789 Other specified health status: Secondary | ICD-10-CM | POA: Diagnosis not present

## 2020-03-21 DIAGNOSIS — R509 Fever, unspecified: Secondary | ICD-10-CM | POA: Diagnosis not present

## 2020-03-22 ENCOUNTER — Other Ambulatory Visit: Payer: Self-pay

## 2020-03-22 NOTE — Patient Outreach (Signed)
[facility]) Care Management  03/22/2020  IOLA TURRI [birth year] [id number]   Referral Date: 03/22/20 Referral Source: [employer] Report Date of Discharge: 03/20/20 Facility:  [place]: [facility]   Patient noted to be currently hospitalized again at [facility].  Plan: RN CM will close case.    Jone Baseman, RN, MSN [facility] Care Management Coordinator Direct Line [phone] Toll Free: [phone]

## 2020-03-24 ENCOUNTER — Encounter: Payer: Self-pay | Admitting: Family Medicine

## 2020-04-01 ENCOUNTER — Other Ambulatory Visit: Payer: Self-pay

## 2020-04-01 NOTE — Patient Outreach (Signed)
[facility]) Care Management  04/01/2020  Jennifer [name] [birth year] [id number]   Referral Date: 04/01/20 Referral Source: [employer] Report Date of Admission: 03/21/20 Diagnosis: COVID-19 Date of Discharge:03/31/20 Facility: [place]:  [facility]  Outreach attempt: spoke with patient.  She reports she is feeling ok.  She states home health was ordered and that her spouse has all that information.  Patient voices no concerns about hospital discharge.     Social: Patient lives in the home with spouse. She states that her spouse helps her if she needs.    Conditions:PMHx significant for HTN, Obesity, OSA, endometrial cancer s/p hysterectomy, Asthma, SVT, Anxiety, bipolar disorder and COVID-19 pneumonia.  Discussed with patient recovery of COVID-19 and symptoms of worsening COVID and instruction to seek medical attention.    Medications: Patient reports she is taking her medications but declined to review medications on the call.    Appointments: Patient reports she has an appointment with PCP on tomorrow and that spouse will be taking her.    Consent: RN CM reviewed [facility] services with patient. Patient declined services at this time.      Plan: RN CM will send letter and brochure for future reference and close case at this time.     Jone Baseman, RN, MSN [facility] Care Management Care Management Coordinator Direct Line [phone] Toll Free: [phone]  Fax: [phone]

## 2020-04-02 ENCOUNTER — Ambulatory Visit (INDEPENDENT_AMBULATORY_CARE_PROVIDER_SITE_OTHER): Payer: Medicare HMO | Admitting: Family Medicine

## 2020-04-02 ENCOUNTER — Encounter: Payer: Self-pay | Admitting: Family Medicine

## 2020-04-02 VITALS — BP 112/72 | HR 93 | Ht 63.0 in | Wt 218.0 lb

## 2020-04-02 DIAGNOSIS — U071 COVID-19: Secondary | ICD-10-CM | POA: Diagnosis not present

## 2020-04-02 DIAGNOSIS — J1282 Pneumonia due to coronavirus disease 2019: Secondary | ICD-10-CM | POA: Diagnosis not present

## 2020-04-02 DIAGNOSIS — Z9981 Dependence on supplemental oxygen: Secondary | ICD-10-CM | POA: Diagnosis not present

## 2020-04-02 DIAGNOSIS — R5383 Other fatigue: Secondary | ICD-10-CM | POA: Insufficient documentation

## 2020-04-02 DIAGNOSIS — B949 Sequelae of unspecified infectious and parasitic disease: Secondary | ICD-10-CM

## 2020-04-02 DIAGNOSIS — S41102A Unspecified open wound of left upper arm, initial encounter: Secondary | ICD-10-CM | POA: Diagnosis not present

## 2020-04-02 DIAGNOSIS — J3489 Other specified disorders of nose and nasal sinuses: Secondary | ICD-10-CM

## 2020-04-02 MED ORDER — MUPIROCIN 2 % EX OINT: TOPICAL_OINTMENT | Freq: Two times a day (BID) | CUTANEOUS | 0 refills | Status: DC

## 2020-04-02 NOTE — Assessment & Plan Note (Signed)
Continue 3 L.  Patient was afraid that her home oxygen tank was depleted and she would not be able to make it back home so we traded out 1 with her to take home.

## 2020-04-02 NOTE — Assessment & Plan Note (Signed)
Recovering from pneumonia due to COVID-19 virus.  Now oxygen dependent on 3 L.  Hopefully she will just continue to improve over the next few weeks I would like to see her back in 1 more week to make sure that she is doing better we discussed the importance of trying to move her legs and arms throughout the day to try to keep her strength.  She has been trying to eat regularly which is great.  Also encouraged her to really push fluids she has had problems in the past with getting very easily dehydrated.  Also discussed using her incentive spirometer very regularly to help work her lungs.  Again I will see her back in 1 month
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
## 2020-04-02 NOTE — Assessment & Plan Note (Signed)
She complains of extreme fatigue again just encouraged her to move her arms and legs at this point she is not really able to get up and walk regularly.  But I want her to just start by continuing to move.  Also avoid any blood clots.

## 2020-04-02 NOTE — Patient Instructions (Addendum)
Please use mupirocin ointment on the wound on your left arm that has been draining.  If you notice the redness spreading or it continues to drain then please let me know.  Continue to use your incentive spirometer 3 times a day if possible.  Work on movement of your legs and arms.  Continue to use your Ayr nasal moisturizer.  If you have a coolmist humidifier then try to run that as well humidify the air.  Try to clear your nose as gently as possible to not loosen the clots.

## 2020-04-03 ENCOUNTER — Encounter: Payer: Self-pay | Admitting: Family Medicine

## 2020-04-03 DIAGNOSIS — D508 Other iron deficiency anemias: Secondary | ICD-10-CM

## 2020-04-05 DIAGNOSIS — I213 ST elevation (STEMI) myocardial infarction of unspecified site: Secondary | ICD-10-CM | POA: Diagnosis not present

## 2020-04-05 DIAGNOSIS — R079 Chest pain, unspecified: Secondary | ICD-10-CM | POA: Diagnosis not present

## 2020-04-05 DIAGNOSIS — Z8679 Personal history of other diseases of the circulatory system: Secondary | ICD-10-CM | POA: Diagnosis not present

## 2020-04-05 DIAGNOSIS — R0602 Shortness of breath: Secondary | ICD-10-CM | POA: Diagnosis not present

## 2020-04-05 DIAGNOSIS — R9431 Abnormal electrocardiogram [ECG] [EKG]: Secondary | ICD-10-CM | POA: Diagnosis not present

## 2020-04-05 DIAGNOSIS — R0902 Hypoxemia: Secondary | ICD-10-CM | POA: Diagnosis not present

## 2020-04-05 DIAGNOSIS — R064 Hyperventilation: Secondary | ICD-10-CM | POA: Diagnosis not present

## 2020-04-05 DIAGNOSIS — Z8639 Personal history of other endocrine, nutritional and metabolic disease: Secondary | ICD-10-CM | POA: Diagnosis not present

## 2020-04-05 DIAGNOSIS — J1282 Pneumonia due to coronavirus disease 2019: Secondary | ICD-10-CM | POA: Diagnosis not present

## 2020-04-05 DIAGNOSIS — R531 Weakness: Secondary | ICD-10-CM | POA: Diagnosis not present

## 2020-04-05 DIAGNOSIS — R52 Pain, unspecified: Secondary | ICD-10-CM | POA: Diagnosis not present

## 2020-04-05 DIAGNOSIS — R000 Tachycardia, unspecified: Secondary | ICD-10-CM | POA: Diagnosis not present

## 2020-04-05 DIAGNOSIS — R05 Cough: Secondary | ICD-10-CM | POA: Diagnosis not present

## 2020-04-05 DIAGNOSIS — Z8616 Personal history of COVID-19: Secondary | ICD-10-CM | POA: Diagnosis not present

## 2020-04-05 DIAGNOSIS — R509 Fever, unspecified: Secondary | ICD-10-CM | POA: Diagnosis not present

## 2020-04-05 DIAGNOSIS — Z789 Other specified health status: Secondary | ICD-10-CM | POA: Diagnosis not present

## 2020-04-05 DIAGNOSIS — Z7409 Other reduced mobility: Secondary | ICD-10-CM | POA: Diagnosis not present

## 2020-04-05 DIAGNOSIS — R002 Palpitations: Secondary | ICD-10-CM | POA: Diagnosis not present

## 2020-04-05 DIAGNOSIS — U071 COVID-19: Secondary | ICD-10-CM | POA: Diagnosis not present

## 2020-04-06 ENCOUNTER — Telehealth: Payer: Self-pay | Admitting: Family Medicine

## 2020-04-06 DIAGNOSIS — E876 Hypokalemia: Secondary | ICD-10-CM | POA: Diagnosis not present

## 2020-04-06 DIAGNOSIS — E611 Iron deficiency: Secondary | ICD-10-CM | POA: Diagnosis not present

## 2020-04-06 DIAGNOSIS — R9431 Abnormal electrocardiogram [ECG] [EKG]: Secondary | ICD-10-CM | POA: Diagnosis not present

## 2020-04-06 LAB — BASIC METABOLIC PANEL
BUN: 15 (ref 4–21)
CO2: 27 — AB (ref 13–22)
Chloride: 111 — AB (ref 99–108)
Creatinine: 0.6 (ref 0.5–1.1)
Glucose: 149
Potassium: 3.8 (ref 3.4–5.3)
Sodium: 144 (ref 137–147)

## 2020-04-06 LAB — COMPREHENSIVE METABOLIC PANEL
Calcium: 9.3 (ref 8.7–10.7)
GFR calc non Af Amer: >90

## 2020-04-06 MED ORDER — GLUCOSE BLOOD VI STRP: ORAL_STRIP | 12 refills | Status: DC

## 2020-04-06 NOTE — Telephone Encounter (Signed)
Technically her A1c was not 6.5 or higher so by definition she does not have diabetes.  Certainly she may have had some abnormal glucose levels especially while being hospitalized and being on IV fluids etc.  When she comes back in on Friday we can retest her A1c and see what it is.  If it is less than 6.5 then she does not need to be checking her blood sugars.

## 2020-04-06 NOTE — Telephone Encounter (Signed)
Please call [facility]. Somehow strips were sent to pharmacy. She doesn't have diabetes.

## 2020-04-06 NOTE — Telephone Encounter (Signed)
Appointment has been made. No further questions at this time.  

## 2020-04-06 NOTE — Telephone Encounter (Signed)
Okay to do a brief visit for oxygen follow-up.  In regards to her sugar levels this question needs to be triaged.

## 2020-04-06 NOTE — Telephone Encounter (Signed)
Patient calling in wanting to see if she can get a 15 min visit Friday to get oxygen reduced. Wanted to know what she can do about her sugar levels.

## 2020-04-06 NOTE — Telephone Encounter (Signed)
Attempted to contact patient regarding provider's note. No answer, left a detailed vm msg for pt. Aware of provider's recommendations. Direct call back info provided.

## 2020-04-06 NOTE — Telephone Encounter (Signed)
Either is fine

## 2020-04-06 NOTE — Telephone Encounter (Signed)
Lab order placed and faxed to [facility] for patient.   She states that she had oxygen delivered and no longer needs [place] to place order.   [place] office is contacting patient for short appointment on Friday.   Patient requested Dr Madilyn Fireman look at her hospital notes, she states they were testing her sugars and giving her insulin. She is using some One Touch Test Strips to test sugars at home fasting in the morning and 2-3 hours after meals... and sometimes extra. She does not have insulin and was not told what to do if she has high sugars. She wants refill on test strips but without diagnosis of diabetes insurance will not cover. Can you please review her hospital notes about her sugars and also advise if ok to send? I pended JK

## 2020-04-06 NOTE — Telephone Encounter (Signed)
What time did you want me to work patient in? You have a 240 or 420pm,

## 2020-04-07 ENCOUNTER — Encounter: Payer: Self-pay | Admitting: Family Medicine

## 2020-04-07 NOTE — Telephone Encounter (Signed)
Task completed. Barista, spoke with Pharmacist Sonia Baller). Rx has been cancelled.

## 2020-04-08 ENCOUNTER — Encounter: Payer: Self-pay | Admitting: Family Medicine

## 2020-04-08 ENCOUNTER — Telehealth: Payer: Self-pay

## 2020-04-08 DIAGNOSIS — I471 Supraventricular tachycardia: Secondary | ICD-10-CM | POA: Diagnosis not present

## 2020-04-08 DIAGNOSIS — E119 Type 2 diabetes mellitus without complications: Secondary | ICD-10-CM | POA: Diagnosis not present

## 2020-04-08 DIAGNOSIS — F419 Anxiety disorder, unspecified: Secondary | ICD-10-CM | POA: Diagnosis not present

## 2020-04-08 DIAGNOSIS — G4733 Obstructive sleep apnea (adult) (pediatric): Secondary | ICD-10-CM | POA: Diagnosis not present

## 2020-04-08 DIAGNOSIS — F319 Bipolar disorder, unspecified: Secondary | ICD-10-CM | POA: Diagnosis not present

## 2020-04-08 DIAGNOSIS — I10 Essential (primary) hypertension: Secondary | ICD-10-CM | POA: Diagnosis not present

## 2020-04-08 DIAGNOSIS — J9600 Acute respiratory failure, unspecified whether with hypoxia or hypercapnia: Secondary | ICD-10-CM | POA: Diagnosis not present

## 2020-04-08 DIAGNOSIS — G35 Multiple sclerosis: Secondary | ICD-10-CM | POA: Diagnosis not present

## 2020-04-08 DIAGNOSIS — J45909 Unspecified asthma, uncomplicated: Secondary | ICD-10-CM | POA: Diagnosis not present

## 2020-04-08 NOTE — Telephone Encounter (Signed)
Note:

## 2020-04-08 NOTE — Telephone Encounter (Signed)
Jennifer [name] from [facility] called wanting to let us know that today was the day patient's standing orders expired. Requesting that standing order be re-ordered and faxed to [place] at [phone].

## 2020-04-09 ENCOUNTER — Other Ambulatory Visit: Payer: Self-pay

## 2020-04-09 ENCOUNTER — Encounter: Payer: Self-pay | Admitting: Family Medicine

## 2020-04-09 ENCOUNTER — Ambulatory Visit (INDEPENDENT_AMBULATORY_CARE_PROVIDER_SITE_OTHER): Payer: Medicare HMO | Admitting: Family Medicine

## 2020-04-09 VITALS — BP 130/77 | HR 107 | Ht 63.0 in | Wt 214.0 lb

## 2020-04-09 DIAGNOSIS — R0902 Hypoxemia: Secondary | ICD-10-CM | POA: Diagnosis not present

## 2020-04-09 DIAGNOSIS — B370 Candidal stomatitis: Secondary | ICD-10-CM

## 2020-04-09 DIAGNOSIS — I10 Essential (primary) hypertension: Secondary | ICD-10-CM | POA: Diagnosis not present

## 2020-04-09 DIAGNOSIS — J1282 Pneumonia due to coronavirus disease 2019: Secondary | ICD-10-CM | POA: Diagnosis not present

## 2020-04-09 DIAGNOSIS — U071 COVID-19: Secondary | ICD-10-CM

## 2020-04-09 DIAGNOSIS — R21 Rash and other nonspecific skin eruption: Secondary | ICD-10-CM | POA: Diagnosis not present

## 2020-04-09 DIAGNOSIS — J0100 Acute maxillary sinusitis, unspecified: Secondary | ICD-10-CM | POA: Diagnosis not present

## 2020-04-09 MED ORDER — SPIRONOLACTONE 25 MG PO TABS: ORAL_TABLET | ORAL | 1 refills | Status: DC

## 2020-04-09 MED ORDER — CLINDAMYCIN HCL 75 MG PO CAPS: 75.0000 mg | ORAL_CAPSULE | Freq: Three times a day (TID) | ORAL | 0 refills | Status: DC

## 2020-04-09 MED ORDER — METOPROLOL TARTRATE 25 MG PO TABS: 25.0000 mg | ORAL_TABLET | Freq: Three times a day (TID) | ORAL | 3 refills | Status: DC

## 2020-04-09 NOTE — Assessment & Plan Note (Signed)
He is currently on 3 L.  We had her do a walk test with 2 L and she actually did well so we will change her down to 2 L when I see her back next week we can try to do a walk test either without oxygen or 1 later depending on how she is doing.  She has been experiencing some tachycardia with activity but I did discuss that this is normal secondary to deconditioning and her recovery from pretty severe Covid pneumonia.

## 2020-04-09 NOTE — Assessment & Plan Note (Signed)
She recently just had repeat chest x-ray which showed significant improvement
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
## 2020-04-09 NOTE — Assessment & Plan Note (Signed)
Blood pressure actually looks great today we will continue with current regimen did refill her metoprolol today go ahead and restart her spironolactone for the lower extremity edema swelling I think this will actually help she can either take daily or as needed if she is taking it daily then we will need to recheck her BMP when I see her back next week.

## 2020-04-11 IMAGING — CR ABDOMEN - 1 VIEW
2 series · 2 of 2 positions shown · non-contrast
Comparison: CT abdomen pelvis dated September 24, 2018. Abdominal
x-rays dated May 11, 2018.

CLINICAL DATA: Right-sided abdominal and flank pain for the past 5
days.

EXAM:
ABDOMEN - 1 VIEW

[t abdomen supine (1 of 2)]
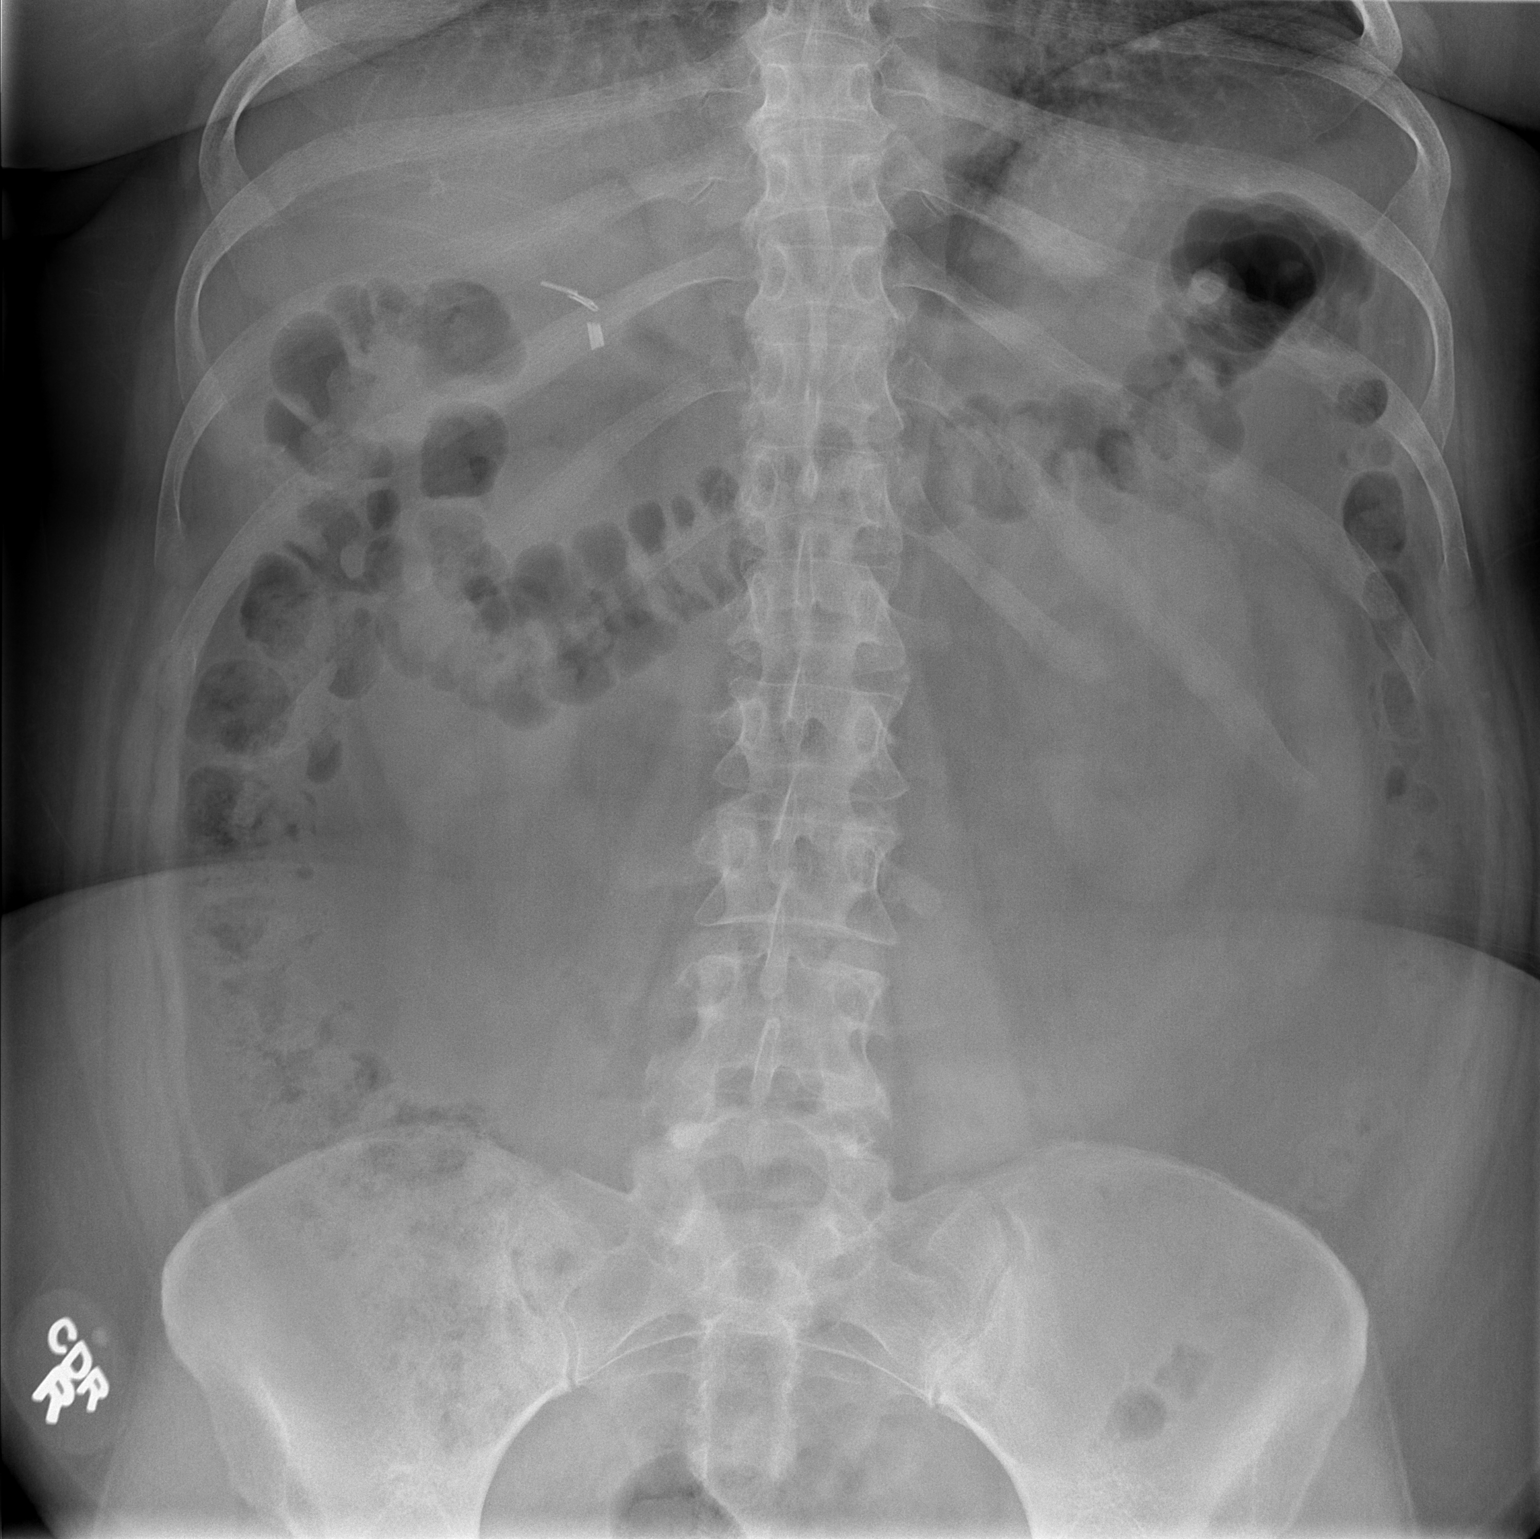

[t abdomen supine (2 of 2)]
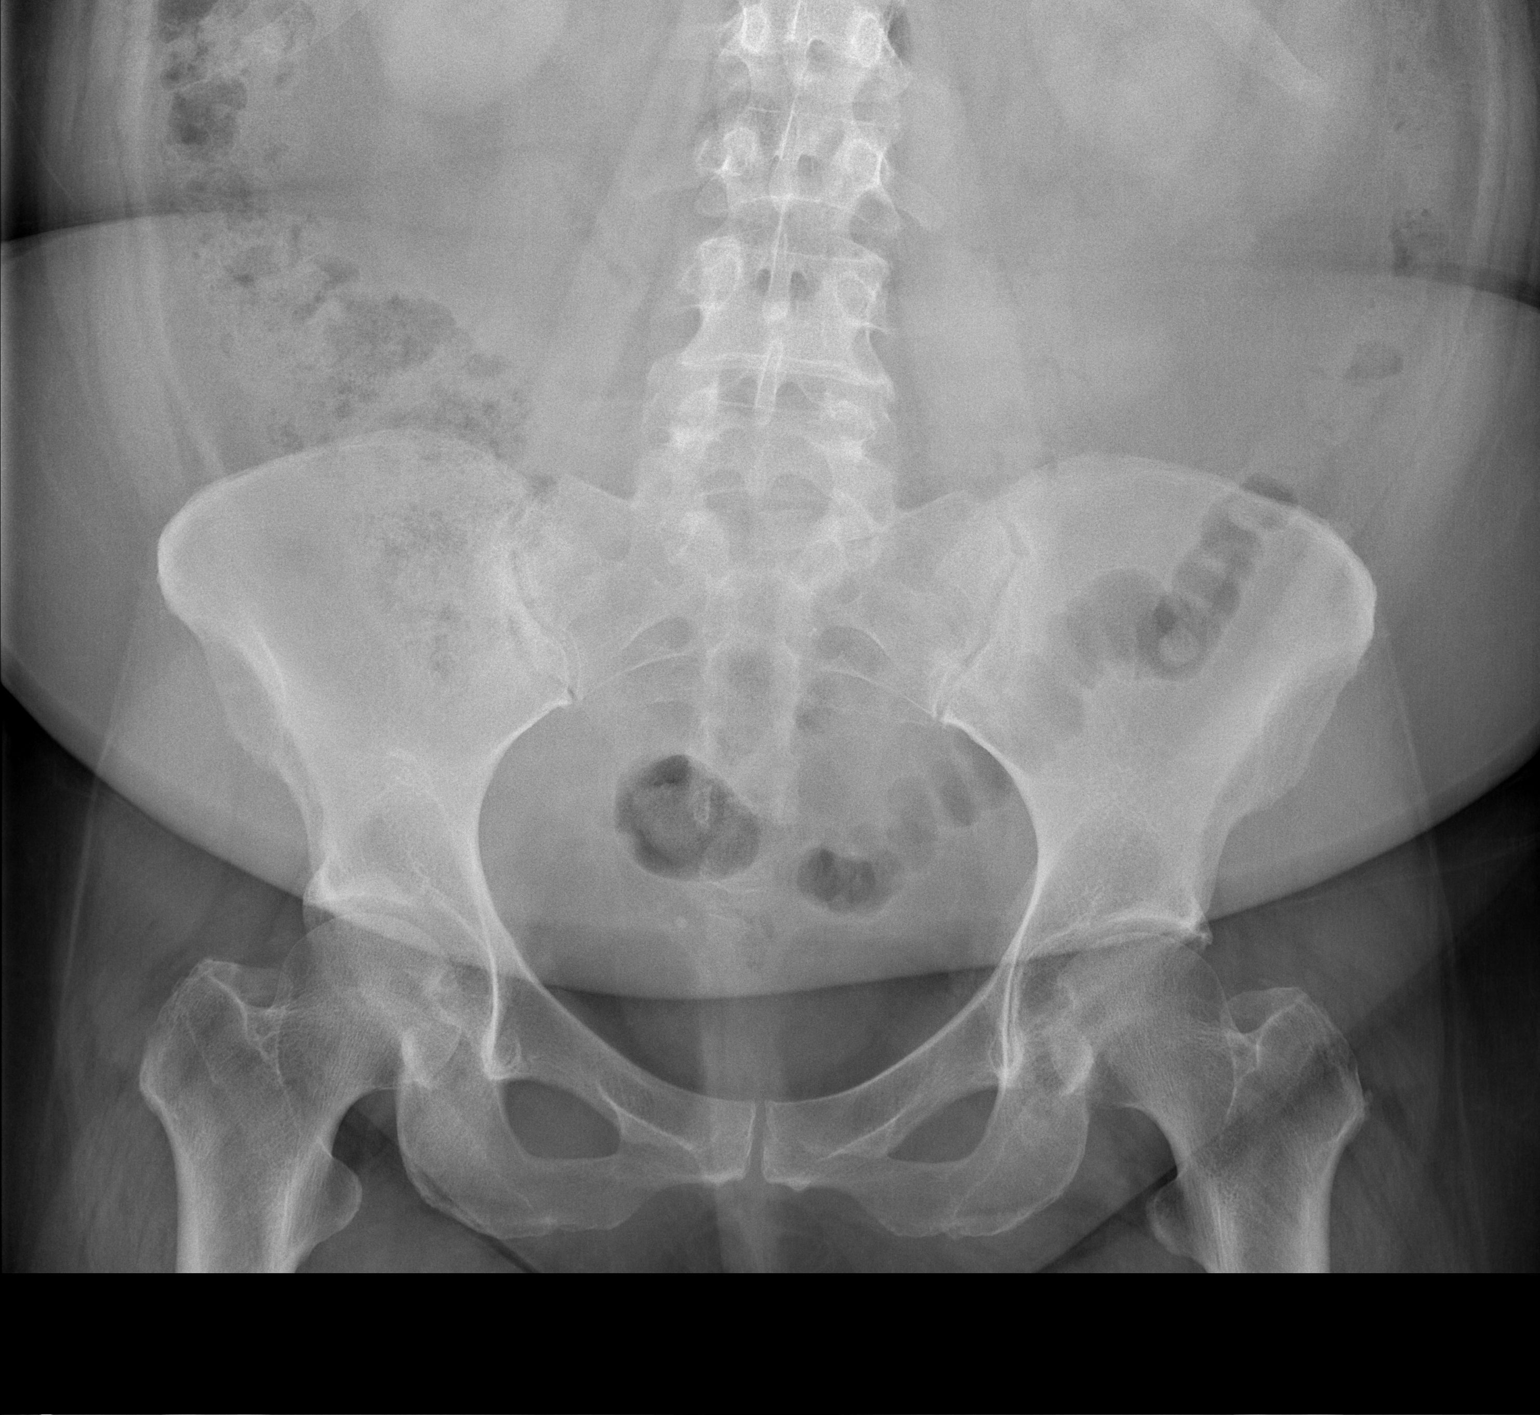

[2 of 2 positions shown; findings below may reference images not displayed]

FINDINGS: The bowel gas pattern is normal. No radio-opaque calculi or other
significant radiographic abnormality are seen. Prior
cholecystectomy. No acute osseous abnormality.
IMPRESSION: Negative.

## 2020-04-12 ENCOUNTER — Telehealth: Payer: Self-pay

## 2020-04-12 LAB — FUNGAL STAIN
FUNGAL SMEAR:: NONE SEEN
FUNGAL SMEAR:: NONE SEEN
MICRO NUMBER:: 10884524
MICRO NUMBER:: 10884531
SPECIMEN QUALITY:: ADEQUATE
SPECIMEN QUALITY:: ADEQUATE

## 2020-04-12 NOTE — Telephone Encounter (Signed)
Jennifer [name] states she did receive the first COVID-19 vaccine. She now reports fever 100.1 and headache.  She reports her o2 has been equal to or greater than 95 %. She did take Tylenol and her temp came down to 99.1. I did advise her to push fluids and to continue taking Tylenol as needed. Advised her to call us back if headache worsens or develops, vomiting or diarrhea.

## 2020-04-12 NOTE — Telephone Encounter (Signed)
Agree with documentation as above.   Zendaya Groseclose, MD  

## 2020-04-13 ENCOUNTER — Other Ambulatory Visit: Payer: Self-pay

## 2020-04-13 ENCOUNTER — Emergency Department (HOSPITAL_BASED_OUTPATIENT_CLINIC_OR_DEPARTMENT_OTHER)
Admission: EM | Admit: 2020-04-13 | Discharge: 2020-04-13 | Disposition: A | Discharge status: Home / Self Care | Payer: Medicare HMO | Attending: Emergency Medicine | Admitting: Emergency Medicine

## 2020-04-13 ENCOUNTER — Encounter (HOSPITAL_BASED_OUTPATIENT_CLINIC_OR_DEPARTMENT_OTHER): Payer: Self-pay | Admitting: *Deleted

## 2020-04-13 ENCOUNTER — Emergency Department (HOSPITAL_BASED_OUTPATIENT_CLINIC_OR_DEPARTMENT_OTHER): Payer: Medicare HMO

## 2020-04-13 DIAGNOSIS — J329 Chronic sinusitis, unspecified: Secondary | ICD-10-CM | POA: Diagnosis not present

## 2020-04-13 DIAGNOSIS — S2231XD Fracture of one rib, right side, subsequent encounter for fracture with routine healing: Secondary | ICD-10-CM | POA: Diagnosis not present

## 2020-04-13 DIAGNOSIS — Z79899 Other long term (current) drug therapy: Secondary | ICD-10-CM

## 2020-04-13 DIAGNOSIS — E876 Hypokalemia: Secondary | ICD-10-CM | POA: Diagnosis not present

## 2020-04-13 DIAGNOSIS — Z7951 Long term (current) use of inhaled steroids: Secondary | ICD-10-CM | POA: Insufficient documentation

## 2020-04-13 DIAGNOSIS — R002 Palpitations: Secondary | ICD-10-CM | POA: Diagnosis not present

## 2020-04-13 DIAGNOSIS — T881XXA Other complications following immunization, not elsewhere classified, initial encounter: Secondary | ICD-10-CM | POA: Diagnosis not present

## 2020-04-13 DIAGNOSIS — R05 Cough: Secondary | ICD-10-CM | POA: Diagnosis present

## 2020-04-13 DIAGNOSIS — R000 Tachycardia, unspecified: Secondary | ICD-10-CM | POA: Diagnosis not present

## 2020-04-13 NOTE — Discharge Instructions (Signed)
Take tylenol 2 pills 4 times a day and motrin 4 pills 3 times a day.  Drink plenty of fluids.  Return for worsening shortness of breath, headache, confusion. Follow up with your family doctor.   

## 2020-04-13 NOTE — Telephone Encounter (Signed)
OK to reorder this standing order? Please advise of order and frequency

## 2020-04-13 NOTE — ED Triage Notes (Signed)
Pt reports sinus infection x 10 days, taking atbx, last night had onset of "heart beating fast" off and on x 10 days, worse since last night. Pt reports "I got my first shot Sunday and I've felt so bad ever since". Denies any pain, sob or chest sensations. ekg performed at triage and shown to dr. Tyrone Nine by Marcie Bal, emt.

## 2020-04-13 NOTE — Progress Notes (Signed)
Pt requested standing order for BMP to be completed in [facility]. Per pt request, the order was faxed to [phone]. Confirmation rec'd.

## 2020-04-13 NOTE — ED Notes (Signed)
Patient ambulated in room 11 on 2l/m [place] as at home with walker as at home.  RR 28-32, HR 99-108, SPO2 99-100% per portable pulse ox.  Patient states SpO2 at lowest at home on r/a 92%.  Drops to 88% at times when sleeping.  Patient returned to continuous monitor and wall O2.

## 2020-04-13 NOTE — Telephone Encounter (Signed)
Yes ok 
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
## 2020-04-14 DIAGNOSIS — U071 COVID-19: Secondary | ICD-10-CM | POA: Diagnosis not present

## 2020-04-14 DIAGNOSIS — I471 Supraventricular tachycardia: Secondary | ICD-10-CM | POA: Diagnosis not present

## 2020-04-14 DIAGNOSIS — R002 Palpitations: Secondary | ICD-10-CM | POA: Diagnosis not present

## 2020-04-14 DIAGNOSIS — J4520 Mild intermittent asthma, uncomplicated: Secondary | ICD-10-CM | POA: Diagnosis not present

## 2020-04-14 DIAGNOSIS — J9600 Acute respiratory failure, unspecified whether with hypoxia or hypercapnia: Secondary | ICD-10-CM | POA: Diagnosis not present

## 2020-04-14 DIAGNOSIS — I10 Essential (primary) hypertension: Secondary | ICD-10-CM | POA: Diagnosis not present

## 2020-04-15 ENCOUNTER — Telehealth: Payer: Self-pay

## 2020-04-15 NOTE — Telephone Encounter (Signed)
Jennifer [name] , social worker with [facility], called to report that social work visit will not start until 9/13 due to patient requesting it be postponed this week and therapist being out of town next week.   FYI to PCP   [place] # [phone]

## 2020-04-16 ENCOUNTER — Encounter: Payer: Self-pay | Admitting: Family Medicine

## 2020-04-16 ENCOUNTER — Ambulatory Visit (INDEPENDENT_AMBULATORY_CARE_PROVIDER_SITE_OTHER): Payer: Medicare HMO | Admitting: Family Medicine

## 2020-04-16 VITALS — BP 147/87 | HR 117 | Wt 213.0 lb

## 2020-04-16 DIAGNOSIS — R0789 Other chest pain: Secondary | ICD-10-CM | POA: Diagnosis not present

## 2020-04-16 DIAGNOSIS — R0602 Shortness of breath: Secondary | ICD-10-CM | POA: Diagnosis not present

## 2020-04-16 DIAGNOSIS — R221 Localized swelling, mass and lump, neck: Secondary | ICD-10-CM

## 2020-04-16 DIAGNOSIS — I10 Essential (primary) hypertension: Secondary | ICD-10-CM

## 2020-04-16 DIAGNOSIS — R0902 Hypoxemia: Secondary | ICD-10-CM | POA: Diagnosis not present

## 2020-04-16 DIAGNOSIS — J0100 Acute maxillary sinusitis, unspecified: Secondary | ICD-10-CM

## 2020-04-16 MED ORDER — LEVOFLOXACIN 250 MG PO TABS: 250.0000 mg | ORAL_TABLET | Freq: Two times a day (BID) | ORAL | 0 refills | Status: AC

## 2020-04-16 NOTE — Progress Notes (Signed)
Patient reports that she stopped taking the ABX.  She would also like to discuss the inhaled steroids.    Sh3e reports that she has not started the Spironolactone due to her BP readings at home being good.    She reports having seen Cardiology recently who did not do an EKG.  She reports still having heart racing and chest discomfort.

## 2020-04-16 NOTE — Assessment & Plan Note (Signed)
He has been using spironolactone some but not consistently she said in fact she actually started to get a couple blood pressure readings that were really good and almost low so she has held the spironolactone for now.
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
## 2020-04-16 NOTE — Assessment & Plan Note (Signed)
Fortunately she continues to improve in fact we did a 6-minute walk test today her blood pressure did drop well she was standing compared to sitting.  But her pulse ox actually remained pretty steady which is fantastic so working to start weaning the oxygen during the day I still want her to wear it at night for now and hopefully when I see her back in a week she just continues to improve.

## 2020-04-19 DIAGNOSIS — Z8616 Personal history of COVID-19: Secondary | ICD-10-CM | POA: Diagnosis not present

## 2020-04-19 DIAGNOSIS — R079 Chest pain, unspecified: Secondary | ICD-10-CM | POA: Diagnosis not present

## 2020-04-19 DIAGNOSIS — R0602 Shortness of breath: Secondary | ICD-10-CM | POA: Diagnosis not present

## 2020-04-20 DIAGNOSIS — R079 Chest pain, unspecified: Secondary | ICD-10-CM | POA: Diagnosis not present

## 2020-04-20 NOTE — Addendum Note (Signed)
Addended by: Narda Rutherford on: 04/20/2020 07:52 AM   Modules accepted: Orders

## 2020-04-21 DIAGNOSIS — F319 Bipolar disorder, unspecified: Secondary | ICD-10-CM | POA: Diagnosis not present

## 2020-04-21 DIAGNOSIS — G4733 Obstructive sleep apnea (adult) (pediatric): Secondary | ICD-10-CM | POA: Diagnosis not present

## 2020-04-21 DIAGNOSIS — F419 Anxiety disorder, unspecified: Secondary | ICD-10-CM | POA: Diagnosis not present

## 2020-04-21 DIAGNOSIS — E119 Type 2 diabetes mellitus without complications: Secondary | ICD-10-CM | POA: Diagnosis not present

## 2020-04-21 DIAGNOSIS — J9600 Acute respiratory failure, unspecified whether with hypoxia or hypercapnia: Secondary | ICD-10-CM | POA: Diagnosis not present

## 2020-04-21 DIAGNOSIS — G35 Multiple sclerosis: Secondary | ICD-10-CM | POA: Diagnosis not present

## 2020-04-21 DIAGNOSIS — J45909 Unspecified asthma, uncomplicated: Secondary | ICD-10-CM | POA: Diagnosis not present

## 2020-04-21 DIAGNOSIS — I471 Supraventricular tachycardia: Secondary | ICD-10-CM | POA: Diagnosis not present

## 2020-04-21 DIAGNOSIS — I10 Essential (primary) hypertension: Secondary | ICD-10-CM | POA: Diagnosis not present

## 2020-04-21 NOTE — Telephone Encounter (Signed)
Can you see if the physician assistant with Dr. Cruz Condon office is able see her for her asthma?  Thank you!

## 2020-04-21 NOTE — Telephone Encounter (Signed)
Hey Chrissie  I am unsure if you can see our Mychart messages back and forth, but I am unsure what to do for the patient at this point.  Thanks

## 2020-04-22 NOTE — Telephone Encounter (Signed)
Hi Chrissie,  The Patient has also been dismissed from Dr Camillo Flaming office. I am not sure what to do at this point. She only wants to go to providers near her, but she has been dismissed from them all.  Thanks

## 2020-04-23 ENCOUNTER — Ambulatory Visit (INDEPENDENT_AMBULATORY_CARE_PROVIDER_SITE_OTHER): Payer: Medicare HMO

## 2020-04-23 ENCOUNTER — Encounter: Payer: Self-pay | Admitting: Family Medicine

## 2020-04-23 ENCOUNTER — Other Ambulatory Visit: Payer: Self-pay

## 2020-04-23 ENCOUNTER — Ambulatory Visit (INDEPENDENT_AMBULATORY_CARE_PROVIDER_SITE_OTHER): Payer: Medicare HMO | Admitting: Family Medicine

## 2020-04-23 VITALS — BP 144/93 | HR 94 | Ht 63.0 in | Wt 213.0 lb

## 2020-04-23 DIAGNOSIS — R0602 Shortness of breath: Secondary | ICD-10-CM | POA: Diagnosis not present

## 2020-04-23 DIAGNOSIS — R0902 Hypoxemia: Secondary | ICD-10-CM

## 2020-04-23 DIAGNOSIS — J4530 Mild persistent asthma, uncomplicated: Secondary | ICD-10-CM | POA: Diagnosis not present

## 2020-04-23 DIAGNOSIS — M19071 Primary osteoarthritis, right ankle and foot: Secondary | ICD-10-CM | POA: Diagnosis not present

## 2020-04-23 DIAGNOSIS — S99921A Unspecified injury of right foot, initial encounter: Secondary | ICD-10-CM

## 2020-04-23 DIAGNOSIS — M7989 Other specified soft tissue disorders: Secondary | ICD-10-CM | POA: Diagnosis not present

## 2020-04-23 DIAGNOSIS — S9031XA Contusion of right foot, initial encounter: Secondary | ICD-10-CM | POA: Diagnosis not present

## 2020-04-23 DIAGNOSIS — R221 Localized swelling, mass and lump, neck: Secondary | ICD-10-CM | POA: Diagnosis not present

## 2020-04-23 DIAGNOSIS — M7731 Calcaneal spur, right foot: Secondary | ICD-10-CM | POA: Diagnosis not present

## 2020-04-23 NOTE — Progress Notes (Signed)
She reports that she has been feeling like she is reversing.   She can do about 20-30 minutes before her legs get weak.

## 2020-04-23 NOTE — Telephone Encounter (Signed)
Can you please schedule her an appointment with one of our providers in [facility].  Thank you!
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
## 2020-04-23 NOTE — Patient Instructions (Signed)
Recommend elevate and ice the foot over the next week.  No sign of fracture.  The radiologist will look at it over the weekend and let us know if they see something different but right now it looks good.  Continue to work on weaning the oxygen just each day try to go a little longer and as long as your oxygen level is staying above 90s percent and you are doing well.  If it drops to 90 or below then I recommend that you sit down, slow down and put her oxygen back on.  Continue to wear at night for right now.

## 2020-04-26 ENCOUNTER — Telehealth: Payer: Self-pay | Admitting: Family Medicine

## 2020-04-26 DIAGNOSIS — R0602 Shortness of breath: Secondary | ICD-10-CM | POA: Diagnosis not present

## 2020-04-26 DIAGNOSIS — R0902 Hypoxemia: Secondary | ICD-10-CM | POA: Diagnosis not present

## 2020-04-26 NOTE — Telephone Encounter (Signed)
Pt called. She has seen her xray results and is concerned that she has a fracture when she thought it was just bruising.  She wants a call back to explain to her what's going on. She was told that everthing was okay.  Thanks.

## 2020-04-26 NOTE — Telephone Encounter (Addendum)
Patient also inquiring about possible new allergy medication?   She also wanted to let Dr Madilyn Fireman know she did go 7 hours without using her oxygen and did okay. She felt a little tired the next day, but was able to go without it some.   Also, she already has a medium aircast boot at home if she needs to wear that for her toe

## 2020-04-26 NOTE — Addendum Note (Signed)
Addended by: Towana Badger on: 04/26/2020 04:16 PM   Modules accepted: Orders

## 2020-04-27 ENCOUNTER — Telehealth: Payer: Self-pay

## 2020-04-27 NOTE — Telephone Encounter (Signed)
Derry Skill, social worker with [facility], called to let PCP know that patient is still receiving P.T. services and had a phone visit as well with their social work to discuss transportation and meals.   No action needed, just update.   Ileana Ladd CB#: [phone]

## 2020-04-28 ENCOUNTER — Telehealth: Payer: Self-pay | Admitting: Family Medicine

## 2020-04-28 ENCOUNTER — Encounter: Payer: Self-pay | Admitting: Family Medicine

## 2020-04-28 DIAGNOSIS — Z87891 Personal history of nicotine dependence: Secondary | ICD-10-CM | POA: Diagnosis not present

## 2020-04-28 DIAGNOSIS — Z8616 Personal history of COVID-19: Secondary | ICD-10-CM | POA: Diagnosis not present

## 2020-04-28 DIAGNOSIS — I10 Essential (primary) hypertension: Secondary | ICD-10-CM | POA: Diagnosis not present

## 2020-04-28 DIAGNOSIS — F419 Anxiety disorder, unspecified: Secondary | ICD-10-CM | POA: Diagnosis not present

## 2020-04-28 DIAGNOSIS — B948 Sequelae of other specified infectious and parasitic diseases: Secondary | ICD-10-CM | POA: Diagnosis not present

## 2020-04-28 DIAGNOSIS — K219 Gastro-esophageal reflux disease without esophagitis: Secondary | ICD-10-CM | POA: Diagnosis not present

## 2020-04-28 DIAGNOSIS — R0602 Shortness of breath: Secondary | ICD-10-CM | POA: Diagnosis not present

## 2020-04-28 MED ORDER — ASMANEX (60 METERED DOSES) 220 MCG/INH IN AEPB: 2.0000 | INHALATION_SPRAY | Freq: Two times a day (BID) | RESPIRATORY_TRACT | 3 refills | Status: DC

## 2020-04-28 NOTE — Assessment & Plan Note (Addendum)
Did well with walk test.  Recommend continue to work on reducing time on oxygen as tolerated. Continue to wear at night.

## 2020-04-28 NOTE — Assessment & Plan Note (Signed)
Will try switching to Asmanex instead of Flovent.  Flovent was causing blisters on the sides of his mouth.

## 2020-04-28 NOTE — Telephone Encounter (Signed)
Call pt: labs look OK, including iron levels.

## 2020-04-28 NOTE — Assessment & Plan Note (Signed)
IMproving

## 2020-04-28 NOTE — Telephone Encounter (Signed)
Advised via [place]

## 2020-04-28 NOTE — Telephone Encounter (Signed)
Please call patient and let her know that that sounds awesome that she has been able to go longer longer without her oxygen that sounds great.  Just take her time and continue to work at it.  She can wear the boot instead of using a postop shoe if she would like it is just a little bulkier.  But as long as it is flat on the bottom and does not allow her to bend her foot then that is perfect.  As far as the allergy medication I am not sure what she is needing I apologize that we may have talked about it and I am just not remembering.

## 2020-04-28 NOTE — Telephone Encounter (Signed)
Patient advised via [place], she did let me know earlier this week this was OK communication for her

## 2020-04-29 ENCOUNTER — Encounter: Payer: Self-pay | Admitting: Allergy and Immunology

## 2020-04-29 ENCOUNTER — Other Ambulatory Visit: Payer: Self-pay

## 2020-04-29 ENCOUNTER — Ambulatory Visit (INDEPENDENT_AMBULATORY_CARE_PROVIDER_SITE_OTHER): Payer: Medicare HMO | Admitting: Allergy and Immunology

## 2020-04-29 VITALS — BP 140/82 | HR 104 | Temp 98.8°F | Resp 16 | Ht 63.0 in | Wt 213.0 lb

## 2020-04-29 DIAGNOSIS — J4520 Mild intermittent asthma, uncomplicated: Secondary | ICD-10-CM | POA: Diagnosis not present

## 2020-04-29 DIAGNOSIS — J3089 Other allergic rhinitis: Secondary | ICD-10-CM | POA: Diagnosis not present

## 2020-04-29 DIAGNOSIS — K219 Gastro-esophageal reflux disease without esophagitis: Secondary | ICD-10-CM

## 2020-04-29 MED ORDER — IPRATROPIUM BROMIDE 0.06 % NA SOLN: 1.0000 | Freq: Three times a day (TID) | NASAL | 5 refills | Status: DC | PRN

## 2020-04-29 MED ORDER — FAMOTIDINE 20 MG PO TABS: 20.0000 mg | ORAL_TABLET | Freq: Two times a day (BID) | ORAL | 5 refills | Status: DC

## 2020-04-29 NOTE — Assessment & Plan Note (Addendum)
   Continue appropriate allergen avoidance measures.  A prescription has been provided for ipratropium 0.06% nasal spray, 1-2 sprays per nostril 2 or 3 times daily as needed.  Nasal saline spray (i.e., Simply Saline) or nasal saline lavage (i.e., NeilMed) is recommended as needed and prior to medicated nasal sprays.

## 2020-04-29 NOTE — Assessment & Plan Note (Addendum)
It is difficult to ascertain if she truly has asthma.  There was not significant postbronchodilator reversibility on pulmonary function testing, nor was there significant reversibility today.  However, the studies were not performed while she had symptoms.  In addition, there has been variability in FEV1 from 1 study to the next with good effort, the highest FEV1 that I was able to find was 105% predicted on August 11, 2019.  She perceives symptom relief with the use of Xopenex at home.  For now, continue Xopenex HFA, 1 to 2 inhalations every 4-6 hours if needed.  We will attempt to assess reversibility by having the patient come into the office for pre-/post spirometry while symptomatic.  Further evaluation by pulmonologist may be warranted to determine underlying etiology.

## 2020-04-29 NOTE — Assessment & Plan Note (Signed)
   Appropriate reflux lifestyle modifications have been provided.  Famotidine (Pepcid) 20 mg twice daily.
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
## 2020-04-29 NOTE — Patient Instructions (Addendum)
Mild intermittent asthma It is difficult to ascertain if she truly has asthma.  There was not significant postbronchodilator reversibility on pulmonary function testing, nor was there significant reversibility today.  However, the studies were not performed while she had symptoms.  In addition, there has been variability in FEV1 from 1 study to the next with good effort, the highest FEV1 that I was able to find was 105% predicted on August 11, 2019.  She perceives symptom relief with the use of Xopenex at home.  For now, continue Xopenex HFA, 1 to 2 inhalations every 4-6 hours if needed.  We will attempt to assess reversibility by having the patient come into the office for pre-/post spirometry while symptomatic.  Further evaluation by pulmonologist may be warranted to determine underlying etiology.  Other allergic rhinitis  Continue appropriate allergen avoidance measures.  A prescription has been provided for ipratropium 0.06% nasal spray, 1-2 sprays per nostril 2 or 3 times daily as needed.  Nasal saline spray (i.e., Simply Saline) or nasal saline lavage (i.e., NeilMed) is recommended as needed and prior to medicated nasal sprays.  Gastroesophageal reflux disease without esophagitis  Appropriate reflux lifestyle modifications have been provided.  Famotidine (Pepcid) 20 mg twice daily.   Return in about 4 months (around 08/29/2020), or if symptoms worsen or fail to improve.  Lifestyle Changes for Controlling GERD  When you have GERD, stomach acid feels as if it's backing up toward your mouth. Whether or not you take medication to control your GERD, your symptoms can often be improved with lifestyle changes.   Raise Your Head  Reflux is more likely to strike when you're lying down flat, because stomach fluid can  flow backward more easily. Raising the head of your bed 4-6 inches can help. To do this:  Slide blocks or books under the legs at the head of your bed. Or, place a  wedge under  the mattress. Many foam stores can make a suitable wedge for you. The wedge  should run from your waist to the top of your head.  Don't just prop your head on several pillows. This increases pressure on your  stomach. It can make GERD worse.  Watch Your Eating Habits Certain foods may increase the acid in your stomach or relax the lower esophageal sphincter, making GERD more likely. It's best to avoid the following:  Coffee, tea, and carbonated drinks (with and without caffeine)  Fatty, fried, or spicy food  Mint, chocolate, onions, and tomatoes  Any other foods that seem to irritate your stomach or cause you pain  Relieve the Pressure  Eat smaller meals, even if you have to eat more often.  Don't lie down right after you eat. Wait a few hours for your stomach to empty.  Avoid tight belts and tight-fitting clothes.  Lose excess weight.  Tobacco and Alcohol  Avoid smoking tobacco and drinking alcohol. They can make GERD symptoms worse.

## 2020-04-30 ENCOUNTER — Ambulatory Visit (INDEPENDENT_AMBULATORY_CARE_PROVIDER_SITE_OTHER): Payer: Medicare HMO | Admitting: Family Medicine

## 2020-04-30 ENCOUNTER — Encounter: Payer: Self-pay | Admitting: Family Medicine

## 2020-04-30 ENCOUNTER — Ambulatory Visit: Payer: Medicare HMO | Admitting: Family Medicine

## 2020-04-30 ENCOUNTER — Ambulatory Visit: Payer: Medicare HMO

## 2020-04-30 ENCOUNTER — Telehealth: Payer: Self-pay | Admitting: *Deleted

## 2020-04-30 VITALS — BP 143/83 | HR 101 | Wt 215.3 lb

## 2020-04-30 DIAGNOSIS — R809 Proteinuria, unspecified: Secondary | ICD-10-CM | POA: Diagnosis not present

## 2020-04-30 DIAGNOSIS — M549 Dorsalgia, unspecified: Secondary | ICD-10-CM

## 2020-04-30 DIAGNOSIS — I471 Supraventricular tachycardia: Secondary | ICD-10-CM | POA: Diagnosis not present

## 2020-04-30 DIAGNOSIS — I10 Essential (primary) hypertension: Secondary | ICD-10-CM | POA: Diagnosis not present

## 2020-04-30 LAB — POCT URINALYSIS DIP (CLINITEK)
Blood, UA: NEGATIVE
Glucose, UA: 100 mg/dL — AB
Leukocytes, UA: NEGATIVE
Nitrite, UA: NEGATIVE
Spec Grav, UA: 1.025 (ref 1.010–1.025)
Urobilinogen, UA: 0.2 E.U./dL
pH, UA: 5.5 (ref 5.0–8.0)

## 2020-04-30 LAB — POCT UA - MICROALBUMIN
Creatinine, POC: 300 mg/dL
Microalbumin Ur, POC: 80 mg/L
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
## 2020-04-30 NOTE — Progress Notes (Signed)
Patient presented today for walking test without oxygen.   Patient walked for 6 minutes. O2 saturation remained steady at 97%.

## 2020-04-30 NOTE — Telephone Encounter (Signed)
My chart message sent to pt.
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
## 2020-05-01 DIAGNOSIS — Z7409 Other reduced mobility: Secondary | ICD-10-CM | POA: Diagnosis not present

## 2020-05-01 DIAGNOSIS — J1282 Pneumonia due to coronavirus disease 2019: Secondary | ICD-10-CM | POA: Diagnosis not present

## 2020-05-01 DIAGNOSIS — R509 Fever, unspecified: Secondary | ICD-10-CM | POA: Diagnosis not present

## 2020-05-01 DIAGNOSIS — Z789 Other specified health status: Secondary | ICD-10-CM | POA: Diagnosis not present

## 2020-05-01 DIAGNOSIS — U071 COVID-19: Secondary | ICD-10-CM | POA: Diagnosis not present

## 2020-05-02 DIAGNOSIS — R0602 Shortness of breath: Secondary | ICD-10-CM | POA: Diagnosis not present

## 2020-05-02 DIAGNOSIS — Z87891 Personal history of nicotine dependence: Secondary | ICD-10-CM | POA: Diagnosis not present

## 2020-05-02 DIAGNOSIS — R002 Palpitations: Secondary | ICD-10-CM | POA: Diagnosis not present

## 2020-05-03 DIAGNOSIS — R002 Palpitations: Secondary | ICD-10-CM | POA: Diagnosis not present

## 2020-05-04 ENCOUNTER — Encounter: Payer: Self-pay | Admitting: Family Medicine

## 2020-05-04 DIAGNOSIS — R809 Proteinuria, unspecified: Secondary | ICD-10-CM | POA: Insufficient documentation

## 2020-05-04 NOTE — Assessment & Plan Note (Signed)
Urinalysis did show a little bit of proteinuria so want keep an eye on that and recheck that again in a few weeks to see if it is persisting and needs additional work-up or if it resolves.

## 2020-05-04 NOTE — Assessment & Plan Note (Signed)
Blood pressure was a little elevated today I really like to see it about 20 points lower.  We will continue to keep an eye on this and continue to work at trying to get the blood pressure under better control.

## 2020-05-05 DIAGNOSIS — Z8616 Personal history of COVID-19: Secondary | ICD-10-CM | POA: Diagnosis not present

## 2020-05-05 DIAGNOSIS — I700 Atherosclerosis of aorta: Secondary | ICD-10-CM | POA: Diagnosis not present

## 2020-05-05 DIAGNOSIS — R109 Unspecified abdominal pain: Secondary | ICD-10-CM | POA: Diagnosis not present

## 2020-05-05 DIAGNOSIS — R0602 Shortness of breath: Secondary | ICD-10-CM | POA: Diagnosis not present

## 2020-05-05 DIAGNOSIS — R809 Proteinuria, unspecified: Secondary | ICD-10-CM | POA: Diagnosis not present

## 2020-05-05 DIAGNOSIS — Z90710 Acquired absence of both cervix and uterus: Secondary | ICD-10-CM | POA: Diagnosis not present

## 2020-05-05 DIAGNOSIS — K5730 Diverticulosis of large intestine without perforation or abscess without bleeding: Secondary | ICD-10-CM | POA: Diagnosis not present

## 2020-05-05 DIAGNOSIS — R000 Tachycardia, unspecified: Secondary | ICD-10-CM | POA: Diagnosis not present

## 2020-05-06 ENCOUNTER — Telehealth: Payer: Self-pay | Admitting: *Deleted

## 2020-05-06 ENCOUNTER — Emergency Department (HOSPITAL_BASED_OUTPATIENT_CLINIC_OR_DEPARTMENT_OTHER): Payer: Medicare HMO

## 2020-05-06 ENCOUNTER — Emergency Department (HOSPITAL_BASED_OUTPATIENT_CLINIC_OR_DEPARTMENT_OTHER)
Admission: EM | Admit: 2020-05-06 | Discharge: 2020-05-07 | Disposition: A | Discharge status: Home / Self Care | Payer: Medicare HMO | Attending: Emergency Medicine | Admitting: Emergency Medicine

## 2020-05-06 ENCOUNTER — Encounter (HOSPITAL_BASED_OUTPATIENT_CLINIC_OR_DEPARTMENT_OTHER): Payer: Self-pay | Admitting: *Deleted

## 2020-05-06 ENCOUNTER — Other Ambulatory Visit: Payer: Self-pay

## 2020-05-06 ENCOUNTER — Ambulatory Visit: Payer: Medicare HMO | Admitting: Family

## 2020-05-06 DIAGNOSIS — C541 Malignant neoplasm of endometrium: Secondary | ICD-10-CM | POA: Insufficient documentation

## 2020-05-06 DIAGNOSIS — Z87891 Personal history of nicotine dependence: Secondary | ICD-10-CM | POA: Insufficient documentation

## 2020-05-06 DIAGNOSIS — Z8616 Personal history of COVID-19: Secondary | ICD-10-CM | POA: Insufficient documentation

## 2020-05-06 DIAGNOSIS — C55 Malignant neoplasm of uterus, part unspecified: Secondary | ICD-10-CM | POA: Diagnosis not present

## 2020-05-06 DIAGNOSIS — Z9049 Acquired absence of other specified parts of digestive tract: Secondary | ICD-10-CM | POA: Diagnosis not present

## 2020-05-06 DIAGNOSIS — Z789 Other specified health status: Secondary | ICD-10-CM | POA: Diagnosis not present

## 2020-05-06 DIAGNOSIS — Z91010 Allergy to peanuts: Secondary | ICD-10-CM | POA: Insufficient documentation

## 2020-05-06 DIAGNOSIS — J1282 Pneumonia due to coronavirus disease 2019: Secondary | ICD-10-CM | POA: Diagnosis not present

## 2020-05-06 DIAGNOSIS — J9811 Atelectasis: Secondary | ICD-10-CM | POA: Diagnosis not present

## 2020-05-06 DIAGNOSIS — Z7409 Other reduced mobility: Secondary | ICD-10-CM | POA: Diagnosis not present

## 2020-05-06 DIAGNOSIS — R0602 Shortness of breath: Secondary | ICD-10-CM | POA: Diagnosis not present

## 2020-05-06 DIAGNOSIS — U071 COVID-19: Secondary | ICD-10-CM | POA: Diagnosis not present

## 2020-05-06 DIAGNOSIS — Z79899 Other long term (current) drug therapy: Secondary | ICD-10-CM | POA: Diagnosis not present

## 2020-05-06 DIAGNOSIS — R0609 Other forms of dyspnea: Secondary | ICD-10-CM

## 2020-05-06 DIAGNOSIS — I10 Essential (primary) hypertension: Secondary | ICD-10-CM | POA: Diagnosis not present

## 2020-05-06 DIAGNOSIS — J45909 Unspecified asthma, uncomplicated: Secondary | ICD-10-CM | POA: Insufficient documentation

## 2020-05-06 DIAGNOSIS — R0600 Dyspnea, unspecified: Secondary | ICD-10-CM | POA: Diagnosis not present

## 2020-05-06 DIAGNOSIS — R509 Fever, unspecified: Secondary | ICD-10-CM | POA: Diagnosis not present

## 2020-05-06 NOTE — ED Triage Notes (Signed)
C/o SOB x 2 days , Covid in July

## 2020-05-06 NOTE — ED Notes (Signed)
Patient placed on 2L [place] via E-cylinder in waiting area. Patient oxygen saturations are 99% on 2L [place]. E-cylinder @ 2200psi. Will continue to monitor and make necessary changes as needed.

## 2020-05-06 NOTE — Telephone Encounter (Signed)
Called and advised pt that we cannot do anything about her O2 tank. She said that she knows that we can't do anything for her oxygen tank and that she is responsible for taking care of that. The reason for her appointment is because her O2% is dropping down to 90% and after she took her 2nd COVID vaccine her throat started swelling like it did after the 1st shot.

## 2020-05-07 ENCOUNTER — Encounter (HOSPITAL_BASED_OUTPATIENT_CLINIC_OR_DEPARTMENT_OTHER): Payer: Self-pay | Admitting: Emergency Medicine

## 2020-05-07 ENCOUNTER — Emergency Department (HOSPITAL_BASED_OUTPATIENT_CLINIC_OR_DEPARTMENT_OTHER): Payer: Medicare HMO

## 2020-05-07 ENCOUNTER — Other Ambulatory Visit: Payer: Self-pay

## 2020-05-07 ENCOUNTER — Ambulatory Visit (INDEPENDENT_AMBULATORY_CARE_PROVIDER_SITE_OTHER): Payer: Medicare HMO | Admitting: Family Medicine

## 2020-05-07 ENCOUNTER — Encounter: Payer: Self-pay | Admitting: Family Medicine

## 2020-05-07 VITALS — BP 143/77 | HR 101 | Ht 63.0 in | Wt 213.0 lb

## 2020-05-07 DIAGNOSIS — R6889 Other general symptoms and signs: Secondary | ICD-10-CM

## 2020-05-07 DIAGNOSIS — R0602 Shortness of breath: Secondary | ICD-10-CM | POA: Diagnosis not present

## 2020-05-07 DIAGNOSIS — U071 COVID-19: Secondary | ICD-10-CM

## 2020-05-07 DIAGNOSIS — J9811 Atelectasis: Secondary | ICD-10-CM | POA: Diagnosis not present

## 2020-05-07 DIAGNOSIS — I10 Essential (primary) hypertension: Secondary | ICD-10-CM

## 2020-05-07 DIAGNOSIS — R0989 Other specified symptoms and signs involving the circulatory and respiratory systems: Secondary | ICD-10-CM

## 2020-05-07 DIAGNOSIS — R0600 Dyspnea, unspecified: Secondary | ICD-10-CM | POA: Diagnosis not present

## 2020-05-07 DIAGNOSIS — Z9049 Acquired absence of other specified parts of digestive tract: Secondary | ICD-10-CM | POA: Diagnosis not present

## 2020-05-07 DIAGNOSIS — J1282 Pneumonia due to coronavirus disease 2019: Secondary | ICD-10-CM | POA: Diagnosis not present

## 2020-05-07 LAB — BASIC METABOLIC PANEL
Anion gap: 9 (ref 5–15)
BUN: 17 mg/dL (ref 6–20)
CO2: 26 mmol/L (ref 22–32)
Calcium: 8.5 mg/dL — ABNORMAL LOW (ref 8.9–10.3)
Chloride: 107 mmol/L (ref 98–111)
Creatinine, Ser: 0.8 mg/dL (ref 0.44–1.00)
GFR calc Af Amer: >60 mL/min (ref >60)
GFR calc non Af Amer: >60 mL/min (ref >60)
Glucose, Bld: 121 mg/dL — ABNORMAL HIGH (ref 70–99)
Potassium: 3.4 mmol/L — ABNORMAL LOW (ref 3.5–5.1)
Sodium: 142 mmol/L (ref 135–145)

## 2020-05-07 LAB — CBC WITH DIFFERENTIAL/PLATELET
Abs Immature Granulocytes: 0.02 10*3/uL (ref 0.00–0.07)
Basophils Absolute: 0 10*3/uL (ref 0.0–0.1)
Basophils Relative: 1 %
Eosinophils Absolute: 0.3 10*3/uL (ref 0.0–0.5)
Eosinophils Relative: 4 %
HCT: 38.1 % (ref 36.0–46.0)
Hemoglobin: 12.2 g/dL (ref 12.0–15.0)
Immature Granulocytes: 0 %
Lymphocytes Relative: 23 %
Lymphs Abs: 1.6 10*3/uL (ref 0.7–4.0)
MCH: 28.5 pg (ref 26.0–34.0)
MCHC: 32 g/dL (ref 30.0–36.0)
MCV: 89 fL (ref 80.0–100.0)
Monocytes Absolute: 0.6 10*3/uL (ref 0.1–1.0)
Monocytes Relative: 9 %
Neutro Abs: 4.3 10*3/uL (ref 1.7–7.7)
Neutrophils Relative %: 63 %
Platelets: 211 10*3/uL (ref 150–400)
RBC: 4.28 MIL/uL (ref 3.87–5.11)
RDW: 14.8 % (ref 11.5–15.5)
WBC: 6.9 10*3/uL (ref 4.0–10.5)
nRBC: 0 % (ref 0.0–0.2)

## 2020-05-07 LAB — TROPONIN I (HIGH SENSITIVITY): Troponin I (High Sensitivity): 3 ng/L (ref <18)

## 2020-05-07 MED ORDER — IOHEXOL 350 MG/ML SOLN
100.0000 mL | Freq: Once | INTRAVENOUS | Status: AC | PRN
  Administered 2020-05-07: 100 mL via INTRAVENOUS

## 2020-05-07 MED ORDER — FLOVENT HFA 110 MCG/ACT IN AERO: 2.0000 | INHALATION_SPRAY | Freq: Two times a day (BID) | RESPIRATORY_TRACT | 1 refills | Status: DC
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
## 2020-05-08 ENCOUNTER — Encounter: Payer: Self-pay | Admitting: Family Medicine

## 2020-05-08 DIAGNOSIS — R0989 Other specified symptoms and signs involving the circulatory and respiratory systems: Secondary | ICD-10-CM | POA: Insufficient documentation

## 2020-05-08 NOTE — Assessment & Plan Note (Signed)
BP not well controlled today. Really need to add a 2nd medication.  BP was high last time here. She does see cardiology

## 2020-05-08 NOTE — Assessment & Plan Note (Signed)
This is chronic and recurrent. I don't feel this was related to the 2nd COVID vaccines. Needs to avoid triggers such as dairy and soy.  Avoid salt.  Avoid processed foods.

## 2020-05-08 NOTE — Assessment & Plan Note (Signed)
Refer to pulmonary. Had recent spirometry with [facility].

## 2020-05-08 NOTE — Assessment & Plan Note (Signed)
Had mouth sores with Arnuity. Will try Flovent HFA.

## 2020-05-10 ENCOUNTER — Other Ambulatory Visit: Payer: Self-pay

## 2020-05-10 ENCOUNTER — Telehealth: Payer: Self-pay

## 2020-05-10 MED ORDER — LEVALBUTEROL TARTRATE 45 MCG/ACT IN AERO: 2.0000 | INHALATION_SPRAY | Freq: Four times a day (QID) | RESPIRATORY_TRACT | 1 refills | Status: DC | PRN

## 2020-05-10 MED ORDER — BUDESONIDE-FORMOTEROL FUMARATE 80-4.5 MCG/ACT IN AERO: 2.0000 | INHALATION_SPRAY | Freq: Two times a day (BID) | RESPIRATORY_TRACT | 1 refills | Status: DC | PRN

## 2020-05-10 NOTE — Telephone Encounter (Addendum)
RF for levoalbuterol HFA x 1 with 1 refill at [facility].

## 2020-05-10 NOTE — Telephone Encounter (Signed)
K.  New prescription sent to pharmacy.

## 2020-05-10 NOTE — Telephone Encounter (Signed)
Jennifer [name] called and states the pharmacist states the Flovent has the same ingredient as the inhaler the hospital gave her. She states that inhaler caused a side effect of a rash. She states the pharmacist recommends Symbicort. Please advise.

## 2020-05-11 ENCOUNTER — Other Ambulatory Visit: Payer: Self-pay

## 2020-05-11 ENCOUNTER — Telehealth: Payer: Self-pay | Admitting: Allergy and Immunology

## 2020-05-11 DIAGNOSIS — Z87891 Personal history of nicotine dependence: Secondary | ICD-10-CM | POA: Diagnosis not present

## 2020-05-11 DIAGNOSIS — M542 Cervicalgia: Secondary | ICD-10-CM | POA: Diagnosis not present

## 2020-05-11 DIAGNOSIS — R079 Chest pain, unspecified: Secondary | ICD-10-CM | POA: Diagnosis not present

## 2020-05-11 DIAGNOSIS — R0789 Other chest pain: Secondary | ICD-10-CM | POA: Diagnosis not present

## 2020-05-11 DIAGNOSIS — R0602 Shortness of breath: Secondary | ICD-10-CM | POA: Diagnosis not present

## 2020-05-11 DIAGNOSIS — Z79899 Other long term (current) drug therapy: Secondary | ICD-10-CM | POA: Diagnosis not present

## 2020-05-11 MED ORDER — LEVALBUTEROL TARTRATE 45 MCG/ACT IN AERO: 2.0000 | INHALATION_SPRAY | Freq: Four times a day (QID) | RESPIRATORY_TRACT | 1 refills | Status: DC | PRN

## 2020-05-11 NOTE — Telephone Encounter (Signed)
Patient advised.

## 2020-05-11 NOTE — Telephone Encounter (Signed)
Thank you :)

## 2020-05-11 NOTE — Telephone Encounter (Signed)
Per carrie, the key is [id number]

## 2020-05-11 NOTE — Telephone Encounter (Signed)
Pt. Request refill for  Xopenex inhaler.

## 2020-05-11 NOTE — Telephone Encounter (Signed)
PT calling saying PA is needed for Xopenex

## 2020-05-11 NOTE — Telephone Encounter (Signed)
PT calling again to follow up on xopenex PA

## 2020-05-11 NOTE — Telephone Encounter (Signed)
Refill has been sent in.  

## 2020-05-11 NOTE — Telephone Encounter (Signed)
Pa submitted thru cover my meds for xopenex

## 2020-05-12 ENCOUNTER — Other Ambulatory Visit: Payer: Self-pay

## 2020-05-12 ENCOUNTER — Encounter: Payer: Self-pay | Admitting: Allergy and Immunology

## 2020-05-12 ENCOUNTER — Ambulatory Visit: Payer: Medicare HMO | Admitting: Allergy and Immunology

## 2020-05-12 ENCOUNTER — Other Ambulatory Visit: Payer: Self-pay | Admitting: Family

## 2020-05-12 ENCOUNTER — Ambulatory Visit (INDEPENDENT_AMBULATORY_CARE_PROVIDER_SITE_OTHER): Payer: Medicare HMO | Admitting: Allergy and Immunology

## 2020-05-12 VITALS — BP 150/100 | HR 90 | Temp 97.4°F | Resp 16 | Ht 63.0 in | Wt 213.0 lb

## 2020-05-12 DIAGNOSIS — K219 Gastro-esophageal reflux disease without esophagitis: Secondary | ICD-10-CM | POA: Diagnosis not present

## 2020-05-12 DIAGNOSIS — R0602 Shortness of breath: Secondary | ICD-10-CM | POA: Diagnosis not present

## 2020-05-12 DIAGNOSIS — J4540 Moderate persistent asthma, uncomplicated: Secondary | ICD-10-CM

## 2020-05-12 DIAGNOSIS — J383 Other diseases of vocal cords: Secondary | ICD-10-CM

## 2020-05-12 DIAGNOSIS — R0781 Pleurodynia: Secondary | ICD-10-CM | POA: Diagnosis not present

## 2020-05-12 DIAGNOSIS — J3089 Other allergic rhinitis: Secondary | ICD-10-CM

## 2020-05-12 DIAGNOSIS — R9431 Abnormal electrocardiogram [ECG] [EKG]: Secondary | ICD-10-CM | POA: Diagnosis not present

## 2020-05-12 MED ORDER — LEVALBUTEROL TARTRATE 45 MCG/ACT IN AERO: 2.0000 | INHALATION_SPRAY | Freq: Four times a day (QID) | RESPIRATORY_TRACT | 1 refills | Status: DC | PRN

## 2020-05-12 MED ORDER — IPRATROPIUM BROMIDE 0.06 % NA SOLN: NASAL | 5 refills | Status: DC

## 2020-05-12 NOTE — Assessment & Plan Note (Addendum)
Given difficulty with inhalation as well as globus sensation, otolaryngology evaluation is warranted.  She apparently has had Botox injections for vocal cord dysfunction in the past.  A referral has been made to otolaryngology, Dr. Benjamine Mola, to assess globus sensation and difficulty with inhalation.  She picked up a prescription for Symbicort 80-4.5 yesterday, however has not started the Symbicort yet.  Start the Symbicort, 2 inhalations twice daily.  To maximize pulmonary deposition, a spacer has been provided along with instructions for its proper administration with an HFA inhaler.  Continue Xopenex HFA, 1 to 2 inhalations every 4-6 hours if needed.  We will attempt a prior authorization for Xopenex HFA refill.  The patient is scheduled to see a pulmonologist in late October.

## 2020-05-12 NOTE — Assessment & Plan Note (Signed)
   Continue appropriate reflux lifestyle modifications.  Famotidine (Pepcid) 20 mg twice daily.

## 2020-05-12 NOTE — Addendum Note (Signed)
Addended by: Orpah Greek D on: 05/12/2020 05:34 PM   Modules accepted: Orders

## 2020-05-12 NOTE — Assessment & Plan Note (Signed)
   Continue appropriate allergen avoidance measures.  A prescription has been provided for ipratropium 0.06% nasal spray, 1-2 sprays per nostril 2 or 3 times daily as needed.  Nasal saline lavage (NeilMed) has been recommended as needed and prior to medicated nasal sprays along with instructions for proper administration.  For thick post nasal drainage, add guaifenesin [phone] mg (Mucinex)  twice daily as needed with adequate hydration as discussed.

## 2020-05-12 NOTE — Patient Instructions (Addendum)
Persistent asthma/other forms of dyspnea Given difficulty with inhalation as well as globus sensation, otolaryngology evaluation is warranted.  She apparently has had Botox injections for vocal cord dysfunction in the past.  A referral has been made to otolaryngology, Dr. Benjamine Mola, to assess globus sensation and difficulty with inhalation.  She picked up a prescription for Symbicort 80-4.5 yesterday, however has not started the Symbicort yet.  Start the Symbicort, 2 inhalations twice daily.  To maximize pulmonary deposition, a spacer has been provided along with instructions for its proper administration with an HFA inhaler.  Continue Xopenex HFA, 1 to 2 inhalations every 4-6 hours if needed.  We will attempt a prior authorization for Xopenex HFA refill.  The patient is scheduled to see a pulmonologist in late October.  Other allergic rhinitis  Continue appropriate allergen avoidance measures.  A prescription has been provided for ipratropium 0.06% nasal spray, 1-2 sprays per nostril 2 or 3 times daily as needed.  Nasal saline lavage (NeilMed) has been recommended as needed and prior to medicated nasal sprays along with instructions for proper administration.  For thick post nasal drainage, add guaifenesin [phone] mg (Mucinex)  twice daily as needed with adequate hydration as discussed.  Gastroesophageal reflux disease without esophagitis  Continue appropriate reflux lifestyle modifications.  Famotidine (Pepcid) 20 mg twice daily.   Return in about 4 months (around 09/11/2020), or if symptoms worsen or fail to improve.

## 2020-05-12 NOTE — Telephone Encounter (Signed)
PA SUBMITTED.

## 2020-05-12 NOTE — Telephone Encounter (Signed)
Please let the patient know

## 2020-05-12 NOTE — Telephone Encounter (Signed)
PA HAS BEEN APPROVED THRU COVER MY MEDS
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
## 2020-05-13 ENCOUNTER — Telehealth: Payer: Self-pay

## 2020-05-13 DIAGNOSIS — R9431 Abnormal electrocardiogram [ECG] [EKG]: Secondary | ICD-10-CM | POA: Diagnosis not present

## 2020-05-13 DIAGNOSIS — R0781 Pleurodynia: Secondary | ICD-10-CM | POA: Diagnosis not present

## 2020-05-13 DIAGNOSIS — R0602 Shortness of breath: Secondary | ICD-10-CM | POA: Diagnosis not present

## 2020-05-13 NOTE — Telephone Encounter (Signed)
Message Received Via Teams:  [12:01 PM] Jennifer [name] (HP) Jennifer [name] dob ([birth year]) needs a referral to Dr. Benjamine Mola for VCD and globus sensation per Dr. Verlin Fester.

## 2020-05-14 ENCOUNTER — Ambulatory Visit (INDEPENDENT_AMBULATORY_CARE_PROVIDER_SITE_OTHER): Payer: Medicare HMO | Admitting: Family Medicine

## 2020-05-14 ENCOUNTER — Encounter: Payer: Self-pay | Admitting: Family Medicine

## 2020-05-14 VITALS — BP 153/83 | HR 98 | Ht 63.0 in | Wt 213.0 lb

## 2020-05-14 DIAGNOSIS — R0602 Shortness of breath: Secondary | ICD-10-CM | POA: Diagnosis not present

## 2020-05-14 DIAGNOSIS — I10 Essential (primary) hypertension: Secondary | ICD-10-CM

## 2020-05-14 DIAGNOSIS — Z9981 Dependence on supplemental oxygen: Secondary | ICD-10-CM | POA: Diagnosis not present

## 2020-05-14 DIAGNOSIS — Z1211 Encounter for screening for malignant neoplasm of colon: Secondary | ICD-10-CM | POA: Diagnosis not present

## 2020-05-14 DIAGNOSIS — G4733 Obstructive sleep apnea (adult) (pediatric): Secondary | ICD-10-CM

## 2020-05-14 MED ORDER — AMBULATORY NON FORMULARY MEDICATION: 0 refills | Status: DC

## 2020-05-14 NOTE — Patient Instructions (Signed)
Please let us know when ready for your colonoscopy

## 2020-05-14 NOTE — Assessment & Plan Note (Signed)
Still occasionally having difficulty at night where she is waking up gasping and feeling like she cannot breathe.  She had a snap diagnostic home study in 2015 with an AHI of 14.  Unfortunately she was unable to tolerate CPAP.  We will likely have to get a in lab sleep study to be able to get the BiPAP but we can certainly check into it.

## 2020-05-14 NOTE — Assessment & Plan Note (Signed)
Send formal order to [facility] to discontinue her oxygen so that they can come and pick it up.  She has yet to start the Symbicort but says she plans on starting it next week.  She did follow back up with asthma and allergy.  Just encouraged her to continue to work on staying active during the days and if she still struggling with fatigue and low endurance but encouraged her to continue to work at it daily to build up her strength and energy levels.

## 2020-05-14 NOTE — Assessment & Plan Note (Signed)
Still working to get her in with pulmonology.  We placed a referral but they had declined the referral so we are waiting to hear back from her.

## 2020-05-14 NOTE — Assessment & Plan Note (Signed)
Pressure is still uncontrolled today.  She is taking the metoprolol.  But she really needs to be taking either higher dose or additional medication she was working with cardiology on this before she got Covid.  Encouraged her to get back in with them.
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
## 2020-05-17 ENCOUNTER — Encounter (INDEPENDENT_AMBULATORY_CARE_PROVIDER_SITE_OTHER): Payer: Self-pay

## 2020-05-17 DIAGNOSIS — R9431 Abnormal electrocardiogram [ECG] [EKG]: Secondary | ICD-10-CM | POA: Diagnosis not present

## 2020-05-17 DIAGNOSIS — Z7951 Long term (current) use of inhaled steroids: Secondary | ICD-10-CM | POA: Diagnosis not present

## 2020-05-17 DIAGNOSIS — R221 Localized swelling, mass and lump, neck: Secondary | ICD-10-CM | POA: Diagnosis not present

## 2020-05-17 DIAGNOSIS — Z79899 Other long term (current) drug therapy: Secondary | ICD-10-CM | POA: Diagnosis not present

## 2020-05-17 DIAGNOSIS — R7989 Other specified abnormal findings of blood chemistry: Secondary | ICD-10-CM | POA: Diagnosis not present

## 2020-05-17 DIAGNOSIS — J189 Pneumonia, unspecified organism: Secondary | ICD-10-CM | POA: Diagnosis not present

## 2020-05-17 DIAGNOSIS — J810 Acute pulmonary edema: Secondary | ICD-10-CM | POA: Diagnosis not present

## 2020-05-17 DIAGNOSIS — Z888 Allergy status to other drugs, medicaments and biological substances status: Secondary | ICD-10-CM | POA: Diagnosis not present

## 2020-05-17 DIAGNOSIS — Z880 Allergy status to penicillin: Secondary | ICD-10-CM | POA: Diagnosis not present

## 2020-05-17 DIAGNOSIS — U099 Post covid-19 condition, unspecified: Secondary | ICD-10-CM | POA: Diagnosis not present

## 2020-05-17 DIAGNOSIS — R0602 Shortness of breath: Secondary | ICD-10-CM | POA: Diagnosis not present

## 2020-05-17 DIAGNOSIS — J8489 Other specified interstitial pulmonary diseases: Secondary | ICD-10-CM | POA: Diagnosis not present

## 2020-05-17 DIAGNOSIS — Z881 Allergy status to other antibiotic agents status: Secondary | ICD-10-CM | POA: Diagnosis not present

## 2020-05-17 DIAGNOSIS — Z882 Allergy status to sulfonamides status: Secondary | ICD-10-CM | POA: Diagnosis not present

## 2020-05-18 ENCOUNTER — Ambulatory Visit (INDEPENDENT_AMBULATORY_CARE_PROVIDER_SITE_OTHER): Payer: Medicare HMO | Admitting: Family Medicine

## 2020-05-18 ENCOUNTER — Encounter: Payer: Self-pay | Admitting: Family Medicine

## 2020-05-18 VITALS — BP 153/87 | HR 107 | Temp 99.2°F | Wt 214.2 lb

## 2020-05-18 DIAGNOSIS — R918 Other nonspecific abnormal finding of lung field: Secondary | ICD-10-CM

## 2020-05-18 DIAGNOSIS — R221 Localized swelling, mass and lump, neck: Secondary | ICD-10-CM

## 2020-05-18 DIAGNOSIS — R0602 Shortness of breath: Secondary | ICD-10-CM

## 2020-05-18 MED ORDER — DEXAMETHASONE SODIUM PHOSPHATE 4 MG/ML IJ SOLN: 2.0000 mg | Freq: Once | INTRAMUSCULAR | Status: DC

## 2020-05-18 MED ORDER — DEXAMETHASONE SODIUM PHOSPHATE 10 MG/ML IJ SOLN
20.0000 mg | Freq: Once | INTRAMUSCULAR | Status: AC
  Administered 2020-05-18: 20 mg via INTRAMUSCULAR
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
## 2020-05-18 NOTE — Patient Instructions (Signed)
Furosemide in the morning with your potassium.  Repeat on Wednesday AM as well.

## 2020-05-19 DIAGNOSIS — K219 Gastro-esophageal reflux disease without esophagitis: Secondary | ICD-10-CM | POA: Diagnosis not present

## 2020-05-19 DIAGNOSIS — I10 Essential (primary) hypertension: Secondary | ICD-10-CM | POA: Diagnosis not present

## 2020-05-19 DIAGNOSIS — Z20822 Contact with and (suspected) exposure to covid-19: Secondary | ICD-10-CM | POA: Diagnosis not present

## 2020-05-19 DIAGNOSIS — J811 Chronic pulmonary edema: Secondary | ICD-10-CM | POA: Diagnosis not present

## 2020-05-19 DIAGNOSIS — G35 Multiple sclerosis: Secondary | ICD-10-CM | POA: Diagnosis not present

## 2020-05-19 DIAGNOSIS — R0602 Shortness of breath: Secondary | ICD-10-CM | POA: Diagnosis not present

## 2020-05-19 DIAGNOSIS — E785 Hyperlipidemia, unspecified: Secondary | ICD-10-CM | POA: Diagnosis not present

## 2020-05-19 DIAGNOSIS — F419 Anxiety disorder, unspecified: Secondary | ICD-10-CM | POA: Diagnosis not present

## 2020-05-19 DIAGNOSIS — J45909 Unspecified asthma, uncomplicated: Secondary | ICD-10-CM | POA: Diagnosis not present

## 2020-05-19 DIAGNOSIS — R0600 Dyspnea, unspecified: Secondary | ICD-10-CM | POA: Diagnosis not present

## 2020-05-19 DIAGNOSIS — E119 Type 2 diabetes mellitus without complications: Secondary | ICD-10-CM | POA: Diagnosis not present

## 2020-05-20 ENCOUNTER — Telehealth: Payer: Self-pay

## 2020-05-20 NOTE — Telephone Encounter (Signed)
Anderson Malta with [facility] at [facility] called and left a message stating they are discharging Jennifer [name] per her request.

## 2020-05-21 ENCOUNTER — Encounter: Payer: Self-pay | Admitting: Family Medicine

## 2020-05-21 ENCOUNTER — Ambulatory Visit (INDEPENDENT_AMBULATORY_CARE_PROVIDER_SITE_OTHER): Payer: Medicare HMO | Admitting: Family Medicine

## 2020-05-21 VITALS — BP 137/82 | HR 97 | Ht 63.0 in | Wt 214.0 lb

## 2020-05-21 DIAGNOSIS — R7301 Impaired fasting glucose: Secondary | ICD-10-CM | POA: Diagnosis not present

## 2020-05-21 DIAGNOSIS — E876 Hypokalemia: Secondary | ICD-10-CM | POA: Diagnosis not present

## 2020-05-21 DIAGNOSIS — Z9981 Dependence on supplemental oxygen: Secondary | ICD-10-CM

## 2020-05-21 DIAGNOSIS — F418 Other specified anxiety disorders: Secondary | ICD-10-CM

## 2020-05-21 DIAGNOSIS — R0602 Shortness of breath: Secondary | ICD-10-CM | POA: Diagnosis not present

## 2020-05-21 DIAGNOSIS — R251 Tremor, unspecified: Secondary | ICD-10-CM | POA: Diagnosis not present

## 2020-05-21 DIAGNOSIS — R457 State of emotional shock and stress, unspecified: Secondary | ICD-10-CM | POA: Diagnosis not present

## 2020-05-21 DIAGNOSIS — R002 Palpitations: Secondary | ICD-10-CM | POA: Diagnosis not present

## 2020-05-21 DIAGNOSIS — J45901 Unspecified asthma with (acute) exacerbation: Secondary | ICD-10-CM | POA: Diagnosis not present

## 2020-05-21 DIAGNOSIS — R000 Tachycardia, unspecified: Secondary | ICD-10-CM | POA: Diagnosis not present

## 2020-05-21 DIAGNOSIS — I517 Cardiomegaly: Secondary | ICD-10-CM | POA: Diagnosis not present

## 2020-05-21 DIAGNOSIS — Z87891 Personal history of nicotine dependence: Secondary | ICD-10-CM | POA: Diagnosis not present

## 2020-05-21 LAB — POCT GLYCOSYLATED HEMOGLOBIN (HGB A1C): Hemoglobin A1C: 6.1 % — AB (ref 4.0–5.6)

## 2020-05-21 MED ORDER — POTASSIUM CHLORIDE 40 MEQ/15ML (20%) PO SOLN: 15.0000 mL | Freq: Every day | ORAL | 1 refills | Status: DC | PRN

## 2020-05-21 NOTE — Patient Instructions (Addendum)
Take 15 ML daily of your potassium until next Tuesday and then go to get your blood work checked on Tuesday to see if the potassium looks like it is corrected if it is corrected then we will have you drop down to 10 mL daily for 1 week and then we will recheck your potassium.  Your A1C is 6.1 today, I want you to continue to work on cutting back on carbohydrates including bread, rice, pasta, noodles, crackers, pretzels etc.  Mostly focused on vegetables without a lot of sauces.  Sauces can be higher in calories and sugar.  Also when I set you up with a nutritionist if you are okay with that.

## 2020-05-21 NOTE — Progress Notes (Signed)
Dexamethasone injection information selected incorrectly. Attempted to add another entry, unable to add because past due and visit is signed off by provider.   Dexamethasone (decadron) injection 2 mg administered on 05/18/20 on patient's Right Deltoid (patient's preference).   [place]: [id number] Lot: [id number] EXP: 06/22 Waste: 0 ml
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
## 2020-05-21 NOTE — Telephone Encounter (Signed)
ok 

## 2020-05-24 ENCOUNTER — Telehealth: Payer: Self-pay | Admitting: Family Medicine

## 2020-05-24 NOTE — Telephone Encounter (Signed)
She is already on the schedule for next MOnday, why does she want to move up the appt?

## 2020-05-24 NOTE — Telephone Encounter (Signed)
PT can't make appointment on the 18th. She wants to come in this friday afternoon.   Please please advise.

## 2020-05-25 NOTE — Telephone Encounter (Signed)
She is not able to make the Monday time slot.

## 2020-05-27 DIAGNOSIS — Z87891 Personal history of nicotine dependence: Secondary | ICD-10-CM | POA: Diagnosis not present

## 2020-05-27 DIAGNOSIS — R059 Cough, unspecified: Secondary | ICD-10-CM | POA: Diagnosis not present

## 2020-05-27 DIAGNOSIS — S2231XA Fracture of one rib, right side, initial encounter for closed fracture: Secondary | ICD-10-CM | POA: Diagnosis not present

## 2020-05-27 DIAGNOSIS — R0602 Shortness of breath: Secondary | ICD-10-CM | POA: Diagnosis not present

## 2020-05-27 DIAGNOSIS — F419 Anxiety disorder, unspecified: Secondary | ICD-10-CM | POA: Diagnosis not present

## 2020-05-27 DIAGNOSIS — R0789 Other chest pain: Secondary | ICD-10-CM | POA: Diagnosis not present

## 2020-05-27 DIAGNOSIS — R109 Unspecified abdominal pain: Secondary | ICD-10-CM | POA: Diagnosis not present

## 2020-05-27 DIAGNOSIS — R079 Chest pain, unspecified: Secondary | ICD-10-CM | POA: Diagnosis not present

## 2020-05-27 NOTE — Assessment & Plan Note (Signed)
Take 15 ML daily of your potassium until next Tuesday and then go to get your blood work checked on Tuesday to see if the potassium looks like it is corrected if it is corrected then we will have you drop down to 10 mL daily for 1 week and then we will recheck your potassium.

## 2020-05-27 NOTE — Telephone Encounter (Signed)
Patient calling in again wanting to know if she can be worked in. States she can not wait 3 weeks out.

## 2020-05-27 NOTE — Telephone Encounter (Signed)
PT scheduled

## 2020-05-27 NOTE — Telephone Encounter (Signed)
Jennifer [name] is contacting patient at this time.

## 2020-05-27 NOTE — Telephone Encounter (Signed)
Ok to schedule in 20 min for Friday.

## 2020-05-27 NOTE — Assessment & Plan Note (Signed)
Most likely essential tremor based on exam. Started after having had COVID.

## 2020-05-27 NOTE — Assessment & Plan Note (Signed)
  Your A1C is 6.1 today, I want you to continue to work on cutting back on carbohydrates including bread, rice, pasta, noodles, crackers, pretzels etc.  Mostly focused on vegetables without a lot of sauces.  Sauces can be higher in calories and sugar.  Also when I set you up with a nutritionist if you are okay with that.

## 2020-05-28 ENCOUNTER — Encounter: Payer: Self-pay | Admitting: Family Medicine

## 2020-05-28 ENCOUNTER — Ambulatory Visit (INDEPENDENT_AMBULATORY_CARE_PROVIDER_SITE_OTHER): Payer: Medicare HMO | Admitting: Family Medicine

## 2020-05-28 VITALS — BP 159/94 | HR 116 | Ht 63.0 in | Wt 231.0 lb

## 2020-05-28 DIAGNOSIS — R140 Abdominal distension (gaseous): Secondary | ICD-10-CM | POA: Diagnosis not present

## 2020-05-28 DIAGNOSIS — R059 Cough, unspecified: Secondary | ICD-10-CM

## 2020-05-28 DIAGNOSIS — K5909 Other constipation: Secondary | ICD-10-CM | POA: Diagnosis not present

## 2020-05-28 DIAGNOSIS — R109 Unspecified abdominal pain: Secondary | ICD-10-CM

## 2020-05-28 DIAGNOSIS — M71349 Other bursal cyst, unspecified hand: Secondary | ICD-10-CM

## 2020-05-28 DIAGNOSIS — R1011 Right upper quadrant pain: Secondary | ICD-10-CM | POA: Diagnosis not present

## 2020-05-28 DIAGNOSIS — R079 Chest pain, unspecified: Secondary | ICD-10-CM | POA: Diagnosis not present

## 2020-05-28 DIAGNOSIS — Z13220 Encounter for screening for lipoid disorders: Secondary | ICD-10-CM | POA: Diagnosis not present

## 2020-05-28 DIAGNOSIS — Z79899 Other long term (current) drug therapy: Secondary | ICD-10-CM | POA: Diagnosis not present

## 2020-05-28 LAB — POCT URINALYSIS DIP (CLINITEK)
Bilirubin, UA: NEGATIVE
Blood, UA: NEGATIVE
Glucose, UA: NEGATIVE mg/dL
Leukocytes, UA: NEGATIVE
Nitrite, UA: NEGATIVE
POC PROTEIN,UA: NEGATIVE
Spec Grav, UA: >=1.03 — AB (ref 1.010–1.025)
Urobilinogen, UA: 0.2 E.U./dL
pH, UA: 5.5 (ref 5.0–8.0)

## 2020-05-28 IMAGING — CT CT ABDOMEN AND PELVIS WITHOUT CONTRAST
2 of 4 series · 16 of 46 positions shown, 18 images · non-contrast
Comparison: 09/24/2018

CLINICAL DATA: Abdominal pain, gastroenteritis or colitis
suspected, constipation, lower abdominal pain, bloating, rectal
pain, nausea

EXAM:
CT ABDOMEN AND PELVIS WITHOUT CONTRAST
TECHNIQUE: Multidetector CT imaging of the abdomen and pelvis was performed
following the standard protocol without IV contrast.

[Series 2: axial st · axial · 0.98mm/px · z∈[-534,-59]mm · 13 of 105 slices shown, 15 images]
[im 5/105  soft-tissue]
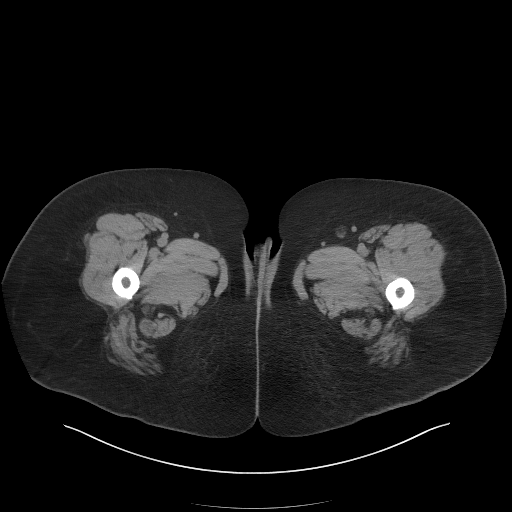
[im 5/105  bone]
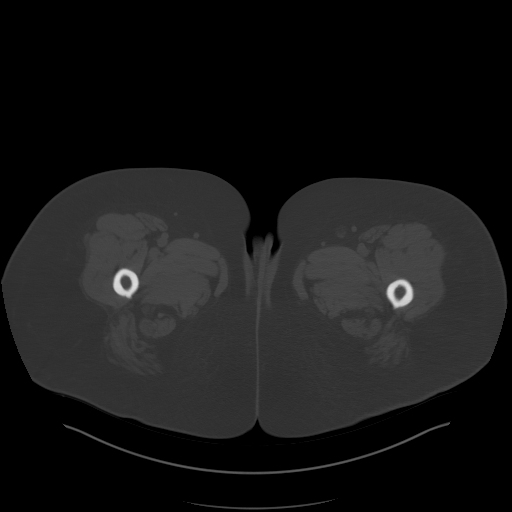
[im 14/105  soft-tissue]
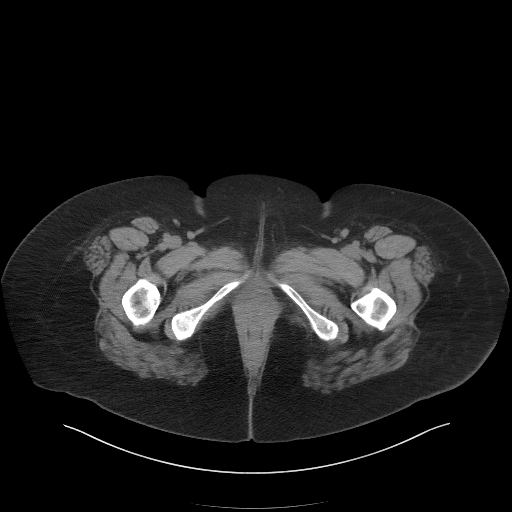
[im 23/105  soft-tissue]
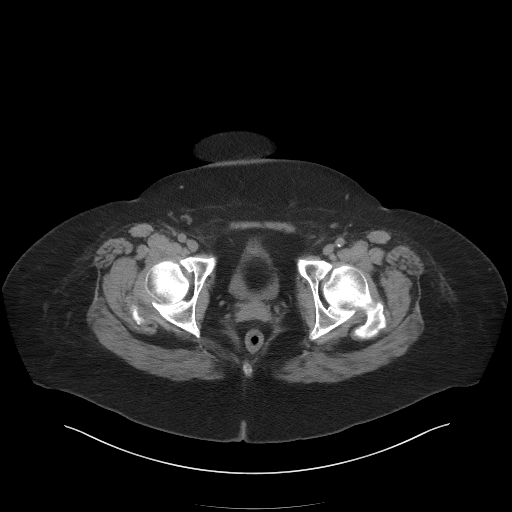
[im 28/105  soft-tissue]
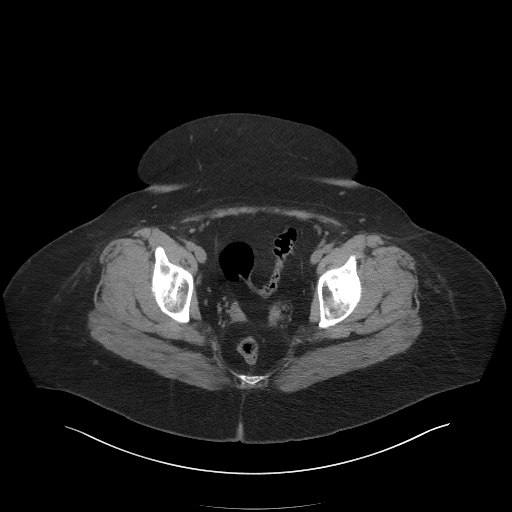
[im 37/105  soft-tissue]
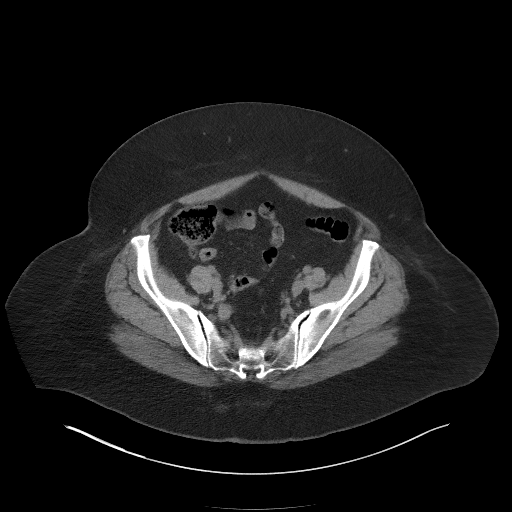
[im 46/105  soft-tissue]
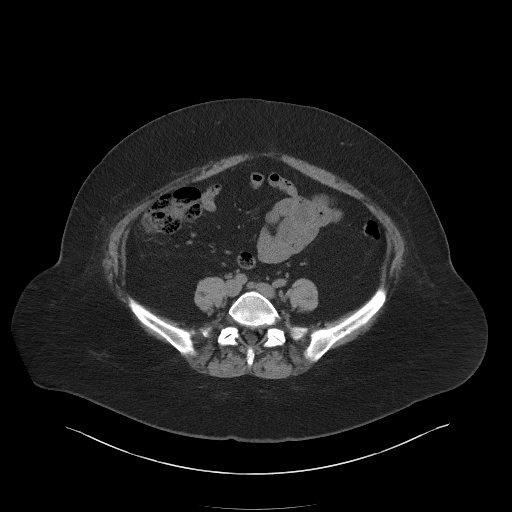
[im 55/105  soft-tissue]
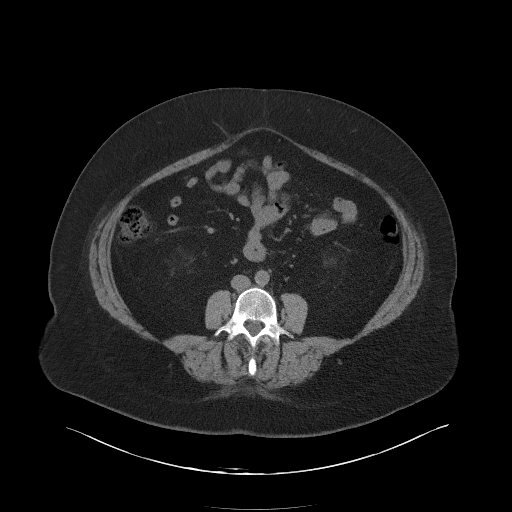
[im 59/105  soft-tissue]
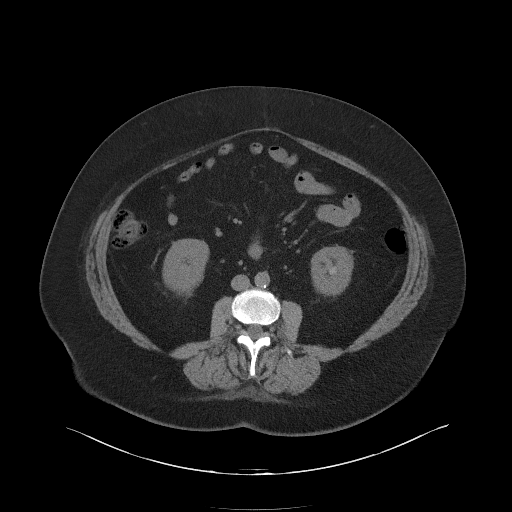
[im 68/105  soft-tissue]
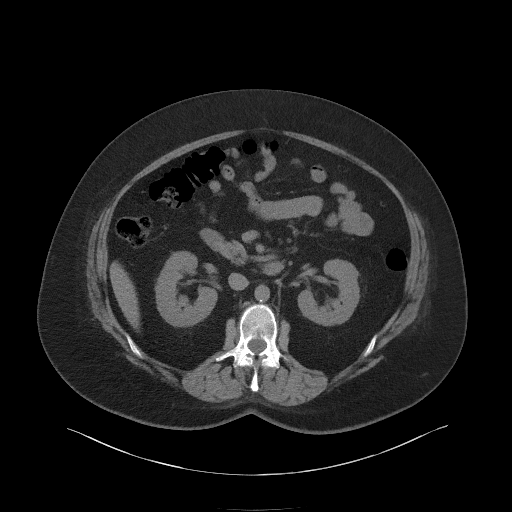
[im 68/105  bone]
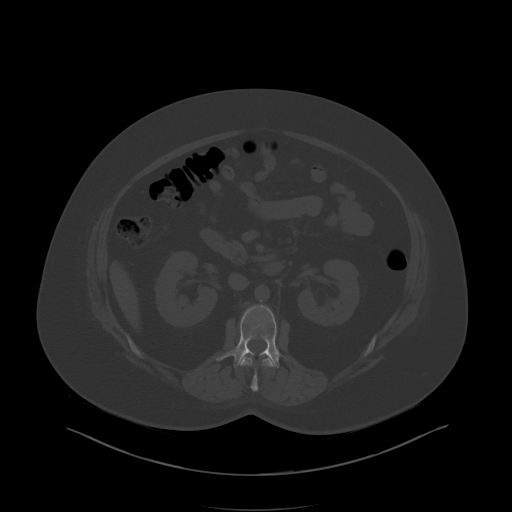
[im 77/105  soft-tissue]
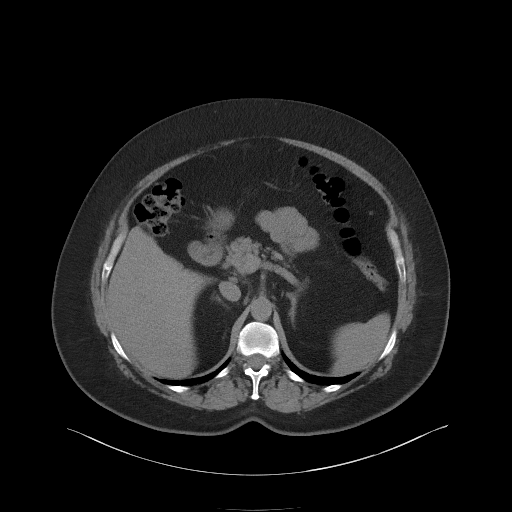
[im 82/105  soft-tissue]
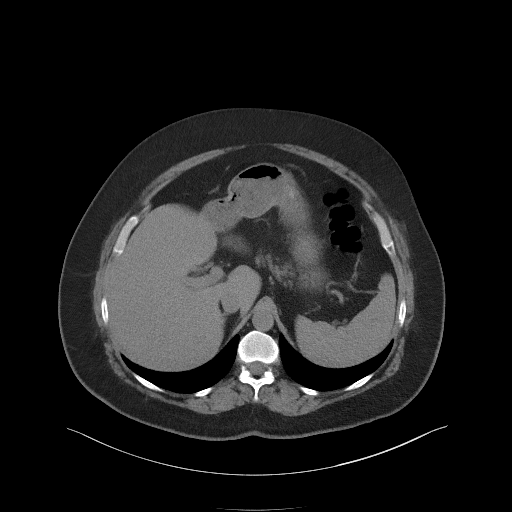
[im 91/105  soft-tissue]
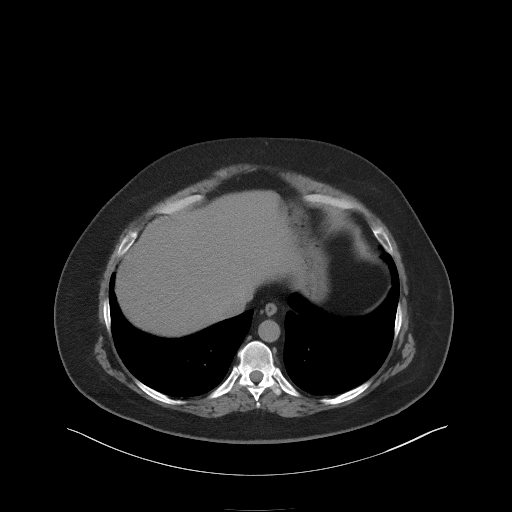
[im 100/105  soft-tissue]
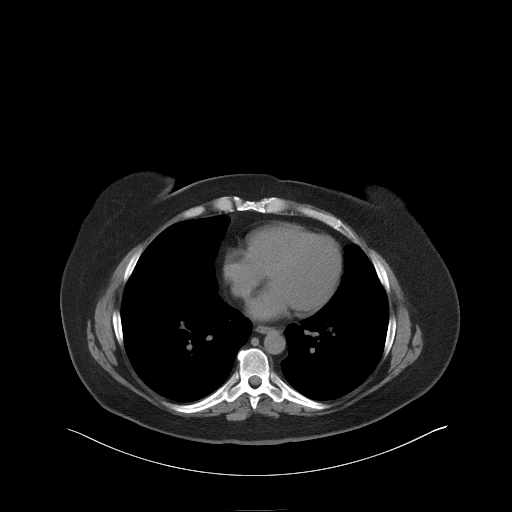

[Series 5: coronal st · coronal · 0.99mm/px · 3 of 116 slices shown]
[im 39/116  soft-tissue]
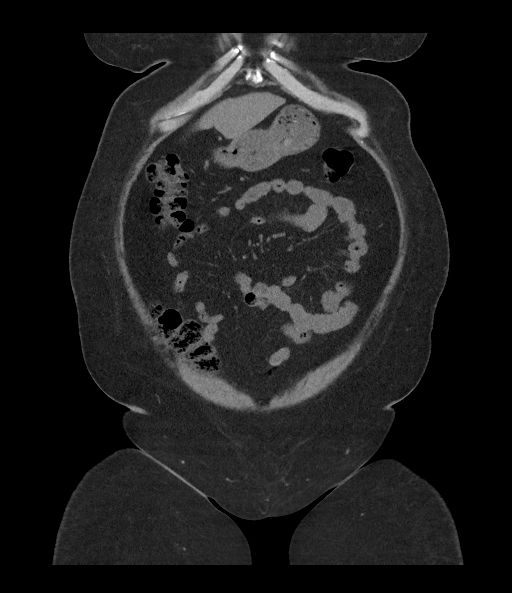
[im 52/116  soft-tissue]
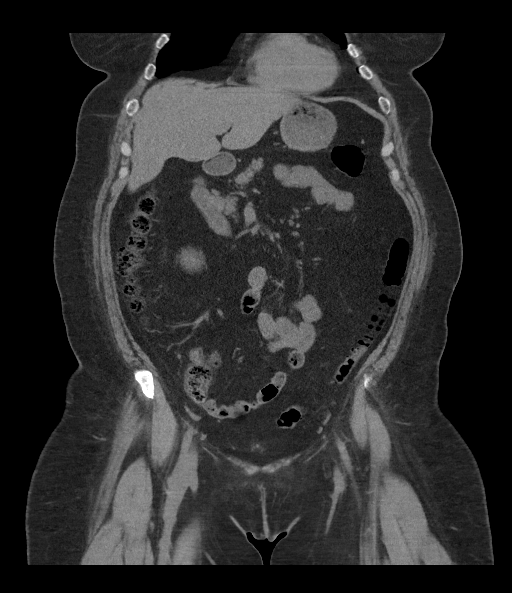
[im 64/116  soft-tissue]
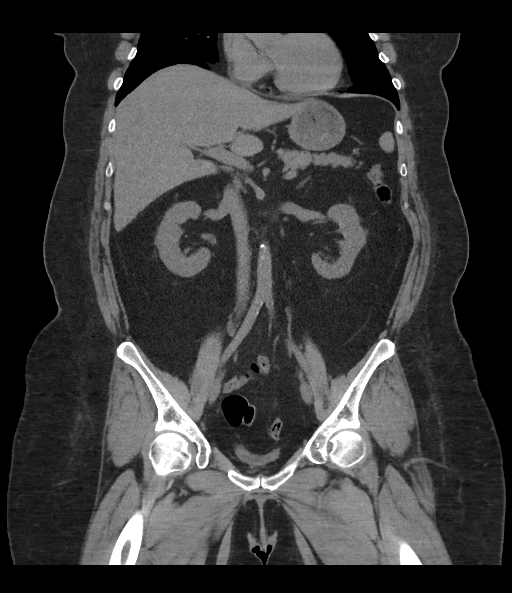

[16 of 46 positions shown; findings below may reference images not displayed]

FINDINGS: Lower chest: No acute abnormality.

Hepatobiliary: No focal liver abnormality is seen. Status post
cholecystectomy. No biliary dilatation.

Pancreas: Unremarkable. No pancreatic ductal dilatation or
surrounding inflammatory changes.

Spleen: Normal in size without significant abnormality.

Adrenals/Urinary Tract: Adrenal glands are unremarkable. Kidneys are
normal, without renal calculi, solid lesion, or hydronephrosis.
Bladder is unremarkable.

Stomach/Bowel: Stomach is within normal limits. Status post
appendectomy. No evidence of bowel wall thickening, distention, or
inflammatory changes. Occasional sigmoid diverticula.

Vascular/Lymphatic: Aortic atherosclerosis. No enlarged abdominal or
pelvic lymph nodes.

Reproductive: Status post hysterectomy.

Other: No abdominal wall hernia or abnormality. No abdominopelvic
ascites.

Musculoskeletal: No acute or significant osseous findings.
IMPRESSION: 1. No acute noncontrast CT findings of the abdomen or pelvis to
explain pain, constipation, bloating in, or nausea. No CT evidence
of gastroenteritis or colitis.

2. Chronic, incidental, and postoperative findings as detailed
above.

## 2020-05-28 MED ORDER — LACTULOSE 10 GM/15ML PO SOLN: 20.0000 g | Freq: Three times a day (TID) | ORAL | 1 refills | Status: DC

## 2020-05-28 NOTE — Progress Notes (Signed)
Pt reports that she has been experiencing R sided abd pain under her ribs and radiates to her back x2 days. Her last BM was today she stated that it was small.   She also c/o coughing up thick white mucus.

## 2020-05-28 NOTE — Assessment & Plan Note (Signed)
Still struggling emotionally.  Also feel like she has some element of PTSD that is really been triggered since being hospitalized for Covid.  I really strongly feel like she would benefit from therapy/counseling and discussed this with her again today and highly encouraged her to consider getting back in with a therapist.

## 2020-05-28 NOTE — Assessment & Plan Note (Signed)
He is actually doing better and has been able to start to wean the oxygen again.  She has a follow-up coming up with pulmonology at the end of the month.
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
## 2020-05-31 ENCOUNTER — Telehealth: Payer: Medicare HMO | Admitting: Family Medicine

## 2020-05-31 ENCOUNTER — Telehealth: Payer: Self-pay

## 2020-05-31 DIAGNOSIS — Z789 Other specified health status: Secondary | ICD-10-CM | POA: Diagnosis not present

## 2020-05-31 DIAGNOSIS — Z7409 Other reduced mobility: Secondary | ICD-10-CM | POA: Diagnosis not present

## 2020-05-31 DIAGNOSIS — R000 Tachycardia, unspecified: Secondary | ICD-10-CM | POA: Diagnosis not present

## 2020-05-31 DIAGNOSIS — R002 Palpitations: Secondary | ICD-10-CM | POA: Diagnosis not present

## 2020-05-31 DIAGNOSIS — U071 COVID-19: Secondary | ICD-10-CM | POA: Diagnosis not present

## 2020-05-31 DIAGNOSIS — R509 Fever, unspecified: Secondary | ICD-10-CM | POA: Diagnosis not present

## 2020-05-31 DIAGNOSIS — J1282 Pneumonia due to coronavirus disease 2019: Secondary | ICD-10-CM | POA: Diagnosis not present

## 2020-05-31 NOTE — Telephone Encounter (Signed)
Patient called requesting update on the urine she had done on Friday.   Requesting detailed msg be left if we are not able to contact her about results.

## 2020-06-01 NOTE — Telephone Encounter (Signed)
Please see updated result note.  I had not had a chance to review it until yesterday.

## 2020-06-02 ENCOUNTER — Ambulatory Visit: Payer: Medicare HMO | Admitting: Family Medicine

## 2020-06-03 DIAGNOSIS — Z79899 Other long term (current) drug therapy: Secondary | ICD-10-CM | POA: Diagnosis not present

## 2020-06-04 ENCOUNTER — Telehealth: Payer: Self-pay | Admitting: Physician Assistant

## 2020-06-04 DIAGNOSIS — K5900 Constipation, unspecified: Secondary | ICD-10-CM | POA: Diagnosis not present

## 2020-06-04 DIAGNOSIS — R9431 Abnormal electrocardiogram [ECG] [EKG]: Secondary | ICD-10-CM | POA: Diagnosis not present

## 2020-06-04 DIAGNOSIS — S2231XA Fracture of one rib, right side, initial encounter for closed fracture: Secondary | ICD-10-CM | POA: Diagnosis not present

## 2020-06-04 DIAGNOSIS — G4733 Obstructive sleep apnea (adult) (pediatric): Secondary | ICD-10-CM

## 2020-06-04 DIAGNOSIS — R0602 Shortness of breath: Secondary | ICD-10-CM | POA: Diagnosis not present

## 2020-06-04 DIAGNOSIS — E876 Hypokalemia: Secondary | ICD-10-CM | POA: Diagnosis not present

## 2020-06-04 DIAGNOSIS — R002 Palpitations: Secondary | ICD-10-CM | POA: Diagnosis not present

## 2020-06-04 NOTE — Telephone Encounter (Signed)
Yes referral to [facility] for in lab sleep study please

## 2020-06-04 NOTE — Telephone Encounter (Signed)
Jennifer [name] called and stated she has an appointment scheduled with [facility] for a sleep study and that Dr. Madilyn Fireman was supposed to put a order in for this and she needed me to fax it to them. I looked through her chart and I found in the 10/1 note where they are talking about doing an in lab study due to patient can not tolerate cpap and they are going to try and get her a bipap. Her last sleep study was 2015 so it should be approved. Dr. Madilyn Fireman did not put the order in can you please put it in so I can fax it to [facility] for her appointment.   Thank you [name]

## 2020-06-04 NOTE — Telephone Encounter (Signed)
Done

## 2020-06-04 NOTE — Telephone Encounter (Signed)
She has sleep study so just a referral to [facility]?

## 2020-06-05 DIAGNOSIS — R9431 Abnormal electrocardiogram [ECG] [EKG]: Secondary | ICD-10-CM | POA: Diagnosis not present

## 2020-06-05 DIAGNOSIS — Z789 Other specified health status: Secondary | ICD-10-CM | POA: Diagnosis not present

## 2020-06-05 DIAGNOSIS — R509 Fever, unspecified: Secondary | ICD-10-CM | POA: Diagnosis not present

## 2020-06-05 DIAGNOSIS — U071 COVID-19: Secondary | ICD-10-CM | POA: Diagnosis not present

## 2020-06-05 DIAGNOSIS — J1282 Pneumonia due to coronavirus disease 2019: Secondary | ICD-10-CM | POA: Diagnosis not present

## 2020-06-05 DIAGNOSIS — Z7409 Other reduced mobility: Secondary | ICD-10-CM | POA: Diagnosis not present

## 2020-06-07 DIAGNOSIS — Z20822 Contact with and (suspected) exposure to covid-19: Secondary | ICD-10-CM | POA: Diagnosis not present

## 2020-06-07 DIAGNOSIS — J069 Acute upper respiratory infection, unspecified: Secondary | ICD-10-CM | POA: Diagnosis not present

## 2020-06-07 DIAGNOSIS — B9789 Other viral agents as the cause of diseases classified elsewhere: Secondary | ICD-10-CM | POA: Diagnosis not present

## 2020-06-07 DIAGNOSIS — R000 Tachycardia, unspecified: Secondary | ICD-10-CM | POA: Diagnosis not present

## 2020-06-09 ENCOUNTER — Emergency Department (HOSPITAL_BASED_OUTPATIENT_CLINIC_OR_DEPARTMENT_OTHER)
Admission: EM | Admit: 2020-06-09 | Discharge: 2020-06-09 | Disposition: A | Discharge status: Home / Self Care | Payer: Medicare HMO | Attending: Emergency Medicine | Admitting: Emergency Medicine

## 2020-06-09 ENCOUNTER — Encounter (HOSPITAL_BASED_OUTPATIENT_CLINIC_OR_DEPARTMENT_OTHER): Payer: Self-pay | Admitting: *Deleted

## 2020-06-09 ENCOUNTER — Other Ambulatory Visit: Payer: Self-pay

## 2020-06-09 DIAGNOSIS — R0602 Shortness of breath: Secondary | ICD-10-CM | POA: Diagnosis not present

## 2020-06-09 DIAGNOSIS — I10 Essential (primary) hypertension: Secondary | ICD-10-CM | POA: Insufficient documentation

## 2020-06-09 DIAGNOSIS — J45909 Unspecified asthma, uncomplicated: Secondary | ICD-10-CM | POA: Insufficient documentation

## 2020-06-09 DIAGNOSIS — Z91010 Allergy to peanuts: Secondary | ICD-10-CM | POA: Diagnosis not present

## 2020-06-09 DIAGNOSIS — R42 Dizziness and giddiness: Secondary | ICD-10-CM | POA: Insufficient documentation

## 2020-06-09 DIAGNOSIS — Z8542 Personal history of malignant neoplasm of other parts of uterus: Secondary | ICD-10-CM | POA: Diagnosis not present

## 2020-06-09 DIAGNOSIS — Z8616 Personal history of COVID-19: Secondary | ICD-10-CM | POA: Insufficient documentation

## 2020-06-09 DIAGNOSIS — Z87891 Personal history of nicotine dependence: Secondary | ICD-10-CM | POA: Insufficient documentation

## 2020-06-09 NOTE — ED Triage Notes (Signed)
C/o left side of head feeling funny, sob, slight double vision,   Using phone without diff  Talking complete  Sentences  02 sat staying at 100 %
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
## 2020-06-09 NOTE — Discharge Instructions (Signed)
You were seen in the emergency department today with lightheadedness and some double vision.  Your EKG here is reassuring.  I reviewed your recent lab work and imaging from the outside emergency departments this month.  I would like for you to follow closely with your pulmonologist.  Please drink plenty of fluids today and get rest.  Please stand slowly by sitting on the edge of the bed first and then hanging onto something when coming to a stand.  Return to the emergency department any new or suddenly worsening symptoms.

## 2020-06-09 NOTE — Telephone Encounter (Signed)
Pt has been advised.

## 2020-06-10 DIAGNOSIS — Z87891 Personal history of nicotine dependence: Secondary | ICD-10-CM | POA: Diagnosis not present

## 2020-06-10 DIAGNOSIS — I700 Atherosclerosis of aorta: Secondary | ICD-10-CM | POA: Diagnosis not present

## 2020-06-10 DIAGNOSIS — K5730 Diverticulosis of large intestine without perforation or abscess without bleeding: Secondary | ICD-10-CM | POA: Diagnosis not present

## 2020-06-10 DIAGNOSIS — R1031 Right lower quadrant pain: Secondary | ICD-10-CM | POA: Diagnosis not present

## 2020-06-10 IMAGING — CR CHEST - 2 VIEW
2 series · 2 of 2 positions shown · non-contrast
Comparison: Most recent comparison 03/13/2019

CLINICAL DATA: Right lower chest pain and shortness of breath for 2
days, nonproductive cough

EXAM:
CHEST - 2 VIEW

[w chest pa]
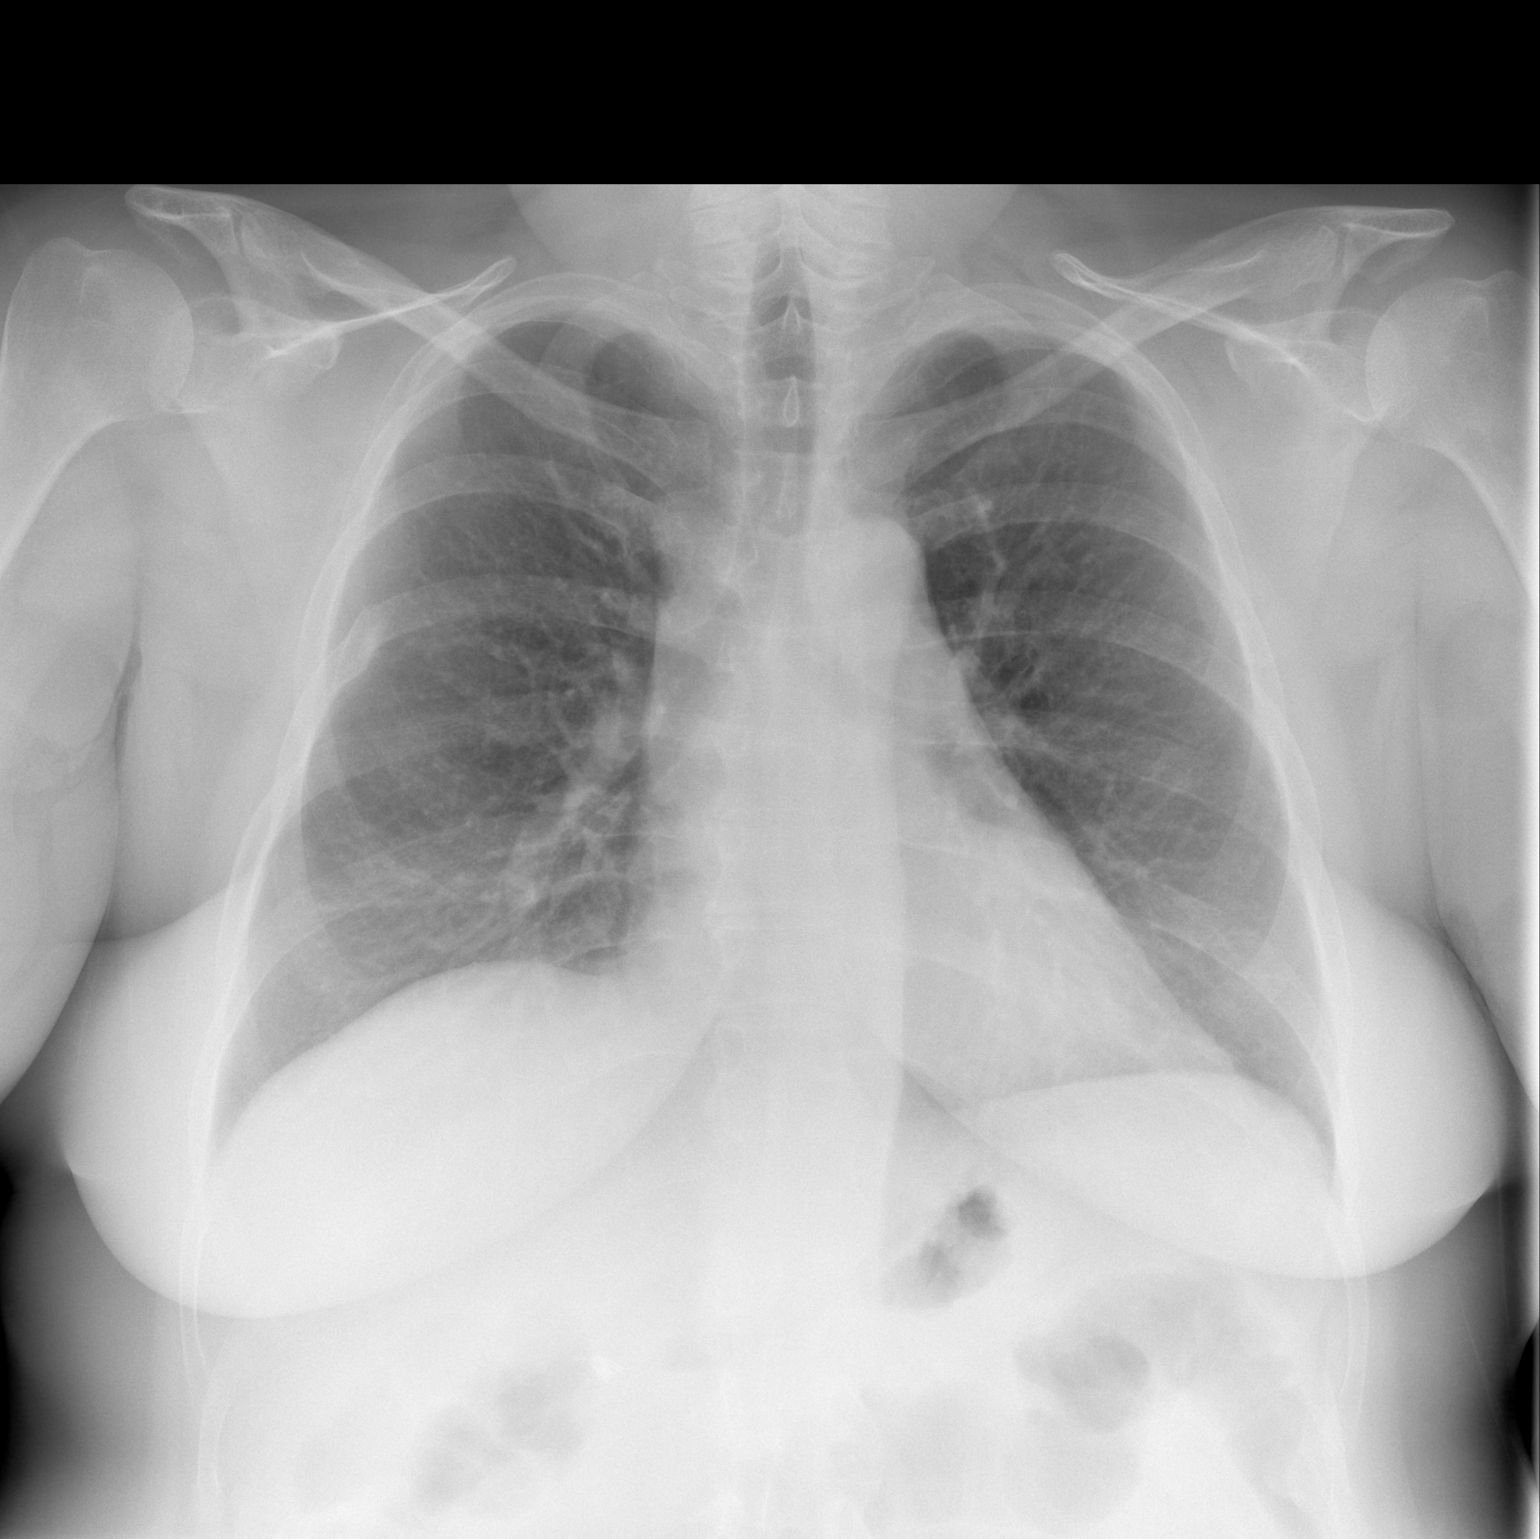

[w chest lat]
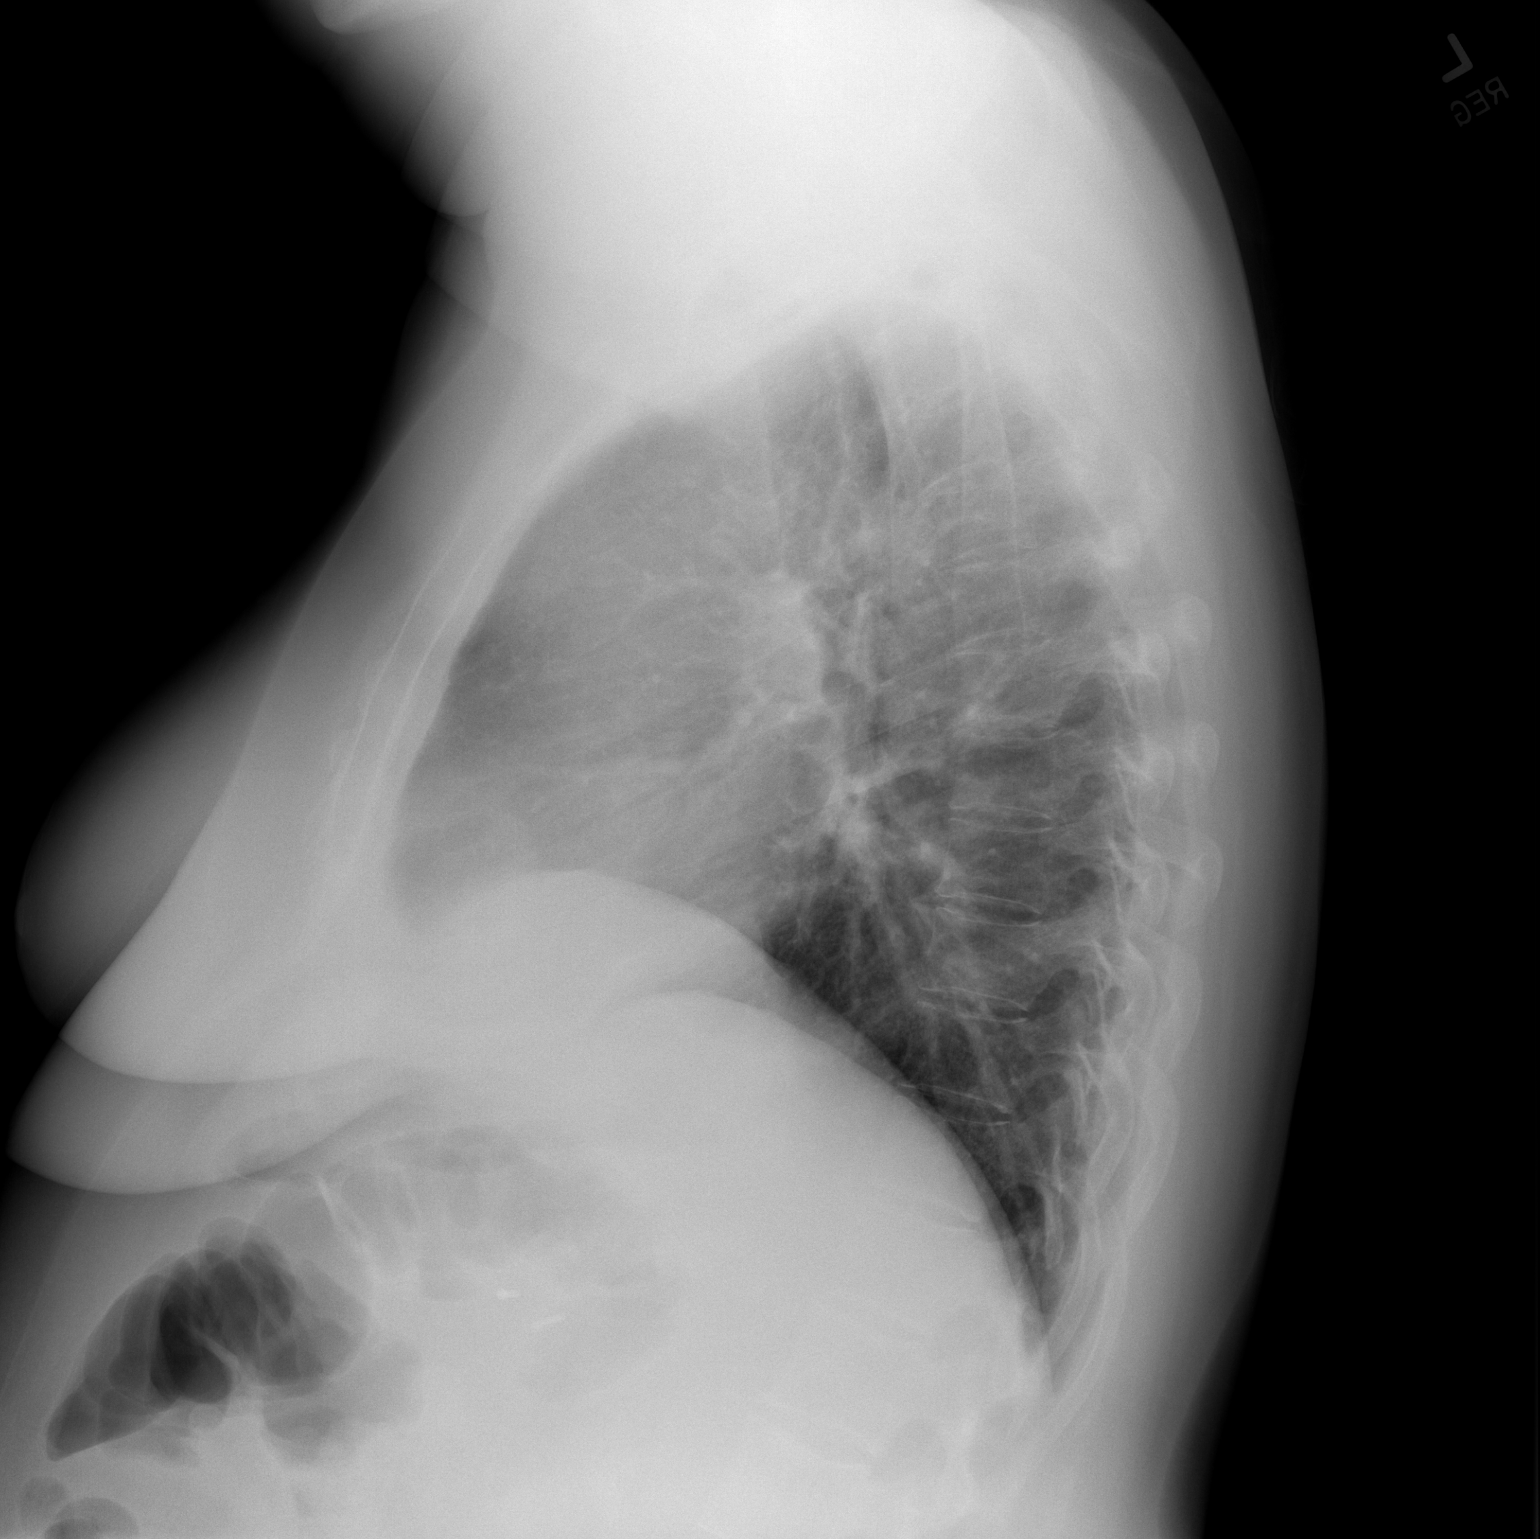

[2 of 2 positions shown; findings below may reference images not displayed]

FINDINGS: No consolidation, features of edema, pneumothorax, or effusion.
Pulmonary vascularity is normally distributed. The cardiomediastinal
contours are unremarkable. No acute osseous or soft tissue
abnormality. Necklace at the base of the neck.
IMPRESSION: No active cardiopulmonary disease.

## 2020-06-11 ENCOUNTER — Encounter: Payer: Self-pay | Admitting: Family Medicine

## 2020-06-11 ENCOUNTER — Ambulatory Visit (INDEPENDENT_AMBULATORY_CARE_PROVIDER_SITE_OTHER): Payer: Medicare HMO | Admitting: Family Medicine

## 2020-06-11 VITALS — BP 157/93 | HR 95 | Ht 63.0 in | Wt 213.0 lb

## 2020-06-11 DIAGNOSIS — I700 Atherosclerosis of aorta: Secondary | ICD-10-CM | POA: Diagnosis not present

## 2020-06-11 DIAGNOSIS — R1031 Right lower quadrant pain: Secondary | ICD-10-CM

## 2020-06-11 MED ORDER — LIVALO 1 MG PO TABS: 1.0000 | ORAL_TABLET | Freq: Every day | ORAL | 1 refills | Status: DC

## 2020-06-11 MED ORDER — HYDROCORTISONE (PERIANAL) 2.5 % EX CREA
1.0000 | TOPICAL_CREAM | Freq: Two times a day (BID) | CUTANEOUS | 3 refills | Status: DC
Start: 2020-06-11 — End: 2020-08-20

## 2020-06-11 NOTE — Assessment & Plan Note (Signed)
Discussed diagnosis noted on CT scan.  Recommend a trial of a statin.  She is willing to try Livalo so we will start with 1 mg nightly.  If not covered by the insurance then recommend a trial of pravastatin.
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
## 2020-06-12 DIAGNOSIS — N281 Cyst of kidney, acquired: Secondary | ICD-10-CM | POA: Diagnosis not present

## 2020-06-12 DIAGNOSIS — K760 Fatty (change of) liver, not elsewhere classified: Secondary | ICD-10-CM | POA: Diagnosis not present

## 2020-06-12 DIAGNOSIS — K5790 Diverticulosis of intestine, part unspecified, without perforation or abscess without bleeding: Secondary | ICD-10-CM | POA: Diagnosis not present

## 2020-06-12 DIAGNOSIS — K5730 Diverticulosis of large intestine without perforation or abscess without bleeding: Secondary | ICD-10-CM | POA: Diagnosis not present

## 2020-06-12 DIAGNOSIS — Z87891 Personal history of nicotine dependence: Secondary | ICD-10-CM | POA: Diagnosis not present

## 2020-06-12 DIAGNOSIS — I700 Atherosclerosis of aorta: Secondary | ICD-10-CM | POA: Diagnosis not present

## 2020-06-12 DIAGNOSIS — N3289 Other specified disorders of bladder: Secondary | ICD-10-CM | POA: Diagnosis not present

## 2020-06-14 DIAGNOSIS — R0683 Snoring: Secondary | ICD-10-CM | POA: Diagnosis not present

## 2020-06-14 DIAGNOSIS — G4710 Hypersomnia, unspecified: Secondary | ICD-10-CM | POA: Diagnosis not present

## 2020-06-14 DIAGNOSIS — E669 Obesity, unspecified: Secondary | ICD-10-CM | POA: Diagnosis not present

## 2020-06-14 DIAGNOSIS — J45909 Unspecified asthma, uncomplicated: Secondary | ICD-10-CM | POA: Diagnosis not present

## 2020-06-15 ENCOUNTER — Telehealth: Payer: Self-pay | Admitting: *Deleted

## 2020-06-15 DIAGNOSIS — Z79899 Other long term (current) drug therapy: Secondary | ICD-10-CM | POA: Diagnosis not present

## 2020-06-15 DIAGNOSIS — M79646 Pain in unspecified finger(s): Secondary | ICD-10-CM

## 2020-06-15 DIAGNOSIS — I700 Atherosclerosis of aorta: Secondary | ICD-10-CM

## 2020-06-15 DIAGNOSIS — C519 Malignant neoplasm of vulva, unspecified: Secondary | ICD-10-CM | POA: Diagnosis not present

## 2020-06-15 DIAGNOSIS — R112 Nausea with vomiting, unspecified: Secondary | ICD-10-CM | POA: Diagnosis not present

## 2020-06-15 DIAGNOSIS — Z8542 Personal history of malignant neoplasm of other parts of uterus: Secondary | ICD-10-CM | POA: Diagnosis not present

## 2020-06-15 DIAGNOSIS — R1011 Right upper quadrant pain: Secondary | ICD-10-CM | POA: Diagnosis not present

## 2020-06-15 NOTE — Telephone Encounter (Signed)
Tried to initiate a PA for Livalo.   [employer]'s covered (formulary) drugs are simvastatin tablet, atorvastatin tablet, rosuvastatin tablet, pravastatin tablet, ezetimibe tablet and Zypitamag tablet.  Pt is going to have to try one of these medications first.

## 2020-06-15 NOTE — Telephone Encounter (Signed)
LMOM for pt to return my call.

## 2020-06-15 NOTE — Telephone Encounter (Signed)
Normally I would just pick 1 and send it but she may have a preference so please call patient and see which one she would be willing to try.  My recommendation would be pravastatin.

## 2020-06-15 NOTE — Telephone Encounter (Signed)
Jennifer [name] states she will take the pravastatin.   She also would like a referral to ortho for her finger.   Also she asked about her lab work. She states her blood sugar was elevated.

## 2020-06-16 MED ORDER — PRAVASTATIN SODIUM 20 MG PO TABS: 20.0000 mg | ORAL_TABLET | ORAL | 0 refills | Status: DC

## 2020-06-16 NOTE — Telephone Encounter (Signed)
Med sent. Ortho referral placed. We can discuss labs results at next appt

## 2020-06-17 NOTE — Telephone Encounter (Signed)
Left pt msg with provider note

## 2020-06-18 ENCOUNTER — Ambulatory Visit (INDEPENDENT_AMBULATORY_CARE_PROVIDER_SITE_OTHER): Payer: Medicare HMO | Admitting: Family Medicine

## 2020-06-18 ENCOUNTER — Encounter: Payer: Self-pay | Admitting: Family Medicine

## 2020-06-18 VITALS — BP 168/94 | HR 115 | Ht 63.0 in | Wt 215.0 lb

## 2020-06-18 DIAGNOSIS — R062 Wheezing: Secondary | ICD-10-CM | POA: Diagnosis not present

## 2020-06-18 DIAGNOSIS — J4540 Moderate persistent asthma, uncomplicated: Secondary | ICD-10-CM | POA: Diagnosis not present

## 2020-06-18 DIAGNOSIS — J3089 Other allergic rhinitis: Secondary | ICD-10-CM

## 2020-06-18 DIAGNOSIS — R0602 Shortness of breath: Secondary | ICD-10-CM | POA: Diagnosis not present

## 2020-06-18 DIAGNOSIS — T17308A Unspecified foreign body in larynx causing other injury, initial encounter: Secondary | ICD-10-CM | POA: Diagnosis not present

## 2020-06-18 DIAGNOSIS — R0782 Intercostal pain: Secondary | ICD-10-CM | POA: Diagnosis not present

## 2020-06-18 DIAGNOSIS — R079 Chest pain, unspecified: Secondary | ICD-10-CM | POA: Diagnosis not present

## 2020-06-18 DIAGNOSIS — R0981 Nasal congestion: Secondary | ICD-10-CM | POA: Diagnosis not present

## 2020-06-18 DIAGNOSIS — I700 Atherosclerosis of aorta: Secondary | ICD-10-CM | POA: Diagnosis not present

## 2020-06-18 DIAGNOSIS — Z87891 Personal history of nicotine dependence: Secondary | ICD-10-CM | POA: Diagnosis not present

## 2020-06-18 MED ORDER — FLOVENT HFA 110 MCG/ACT IN AERO: 2.0000 | INHALATION_SPRAY | Freq: Two times a day (BID) | RESPIRATORY_TRACT | 3 refills | Status: DC
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
## 2020-06-19 ENCOUNTER — Encounter: Payer: Self-pay | Admitting: Family Medicine

## 2020-06-19 NOTE — Assessment & Plan Note (Signed)
She doesn't want to be on albuterol so will change Symbicort to inhaled corticosteroid.  New rx sent for Flovent.

## 2020-06-19 NOTE — Assessment & Plan Note (Signed)
Hasn't started the statin yet but has picked it up.

## 2020-06-19 NOTE — Assessment & Plan Note (Signed)
REfilled flonase

## 2020-06-21 ENCOUNTER — Encounter: Payer: Self-pay | Admitting: Family Medicine

## 2020-06-21 ENCOUNTER — Other Ambulatory Visit: Payer: Self-pay

## 2020-06-21 ENCOUNTER — Ambulatory Visit (INDEPENDENT_AMBULATORY_CARE_PROVIDER_SITE_OTHER): Payer: Medicare HMO | Admitting: Family Medicine

## 2020-06-21 VITALS — BP 120/88 | HR 90 | Temp 96.8°F | Resp 16 | Ht 63.0 in | Wt 213.0 lb

## 2020-06-21 DIAGNOSIS — F458 Other somatoform disorders: Secondary | ICD-10-CM | POA: Diagnosis not present

## 2020-06-21 DIAGNOSIS — J3089 Other allergic rhinitis: Secondary | ICD-10-CM | POA: Diagnosis not present

## 2020-06-21 DIAGNOSIS — R0600 Dyspnea, unspecified: Secondary | ICD-10-CM | POA: Diagnosis not present

## 2020-06-21 DIAGNOSIS — J4520 Mild intermittent asthma, uncomplicated: Secondary | ICD-10-CM

## 2020-06-21 DIAGNOSIS — K219 Gastro-esophageal reflux disease without esophagitis: Secondary | ICD-10-CM

## 2020-06-21 DIAGNOSIS — I10 Essential (primary) hypertension: Secondary | ICD-10-CM | POA: Diagnosis not present

## 2020-06-21 DIAGNOSIS — R519 Headache, unspecified: Secondary | ICD-10-CM | POA: Diagnosis not present

## 2020-06-21 DIAGNOSIS — Z87891 Personal history of nicotine dependence: Secondary | ICD-10-CM | POA: Diagnosis not present

## 2020-06-21 MED ORDER — FLUTICASONE PROPIONATE 50 MCG/ACT NA SUSP: 2.0000 | Freq: Every day | NASAL | 5 refills | Status: DC

## 2020-06-21 MED ORDER — FAMOTIDINE 20 MG PO TABS
20.0000 mg | ORAL_TABLET | Freq: Two times a day (BID) | ORAL | 5 refills | Status: DC
Start: 2020-06-21 — End: 2020-10-08

## 2020-06-21 NOTE — Patient Instructions (Addendum)
Asthma Begin Flovent 110-2 puffs twice a day with a spacer to prevent cough and wheeze. Call the clinic if you develop a rash Continue Xopenex once every 4-6 hours as needed for cough or wheeze Follow up with your pulmonologist for further evaluation and treatment  Allergic rhinitis Restart Flonase 2 sprays in each nostril once a day for a stuffy nose.  In the right nostril, point the applicator out toward the right ear. In the left nostril, point the applicator out toward the left ear Consider saline nasal rinses as needed for nasal symptoms. Use this before any medicated nasal sprays for best result  Reflux Begin taking famotidine 20 mg twice a day to control reflux. Continue dietary and lifestyle modifications Follow up with your gastroenterologist at your scheduled appointment later this month  Call the clinic if this treatment plan is not working well for you  Follow up in 2 months or sooner if needed.

## 2020-06-21 NOTE — Progress Notes (Addendum)
[facility] [id number] Dept: [phone]  FOLLOW UP NOTE  Patient ID: Jennifer [name], female    DOB: 06/30/1966  Age: 54 y.o. MRN: [id number] Date of Office Visit: 06/21/2020  Assessment  Chief Complaint: Asthma and Cough  HPI Jennifer [name] is a 54 year old female who presents to the clinic for an evaluation chest tightness and cough.  She was last seen in this clinic on 05/12/2020 for evaluation of asthma, allergic rhinitis, and gastroesophageal reflux.  In the interim, she has been seen in the emergency department on 06/18/2020 and 06/21/2020 for wheezing and shortness of breath.  Also of note, she did have Covid in August 2021.  At today's visit, she reports that over the weekend she began to experience symptoms including chest tightness, cough, with one episode of post tussive vomiting.  She reports intermittent shortness of breath, no wheeze, and cough producing clear phlegm.  She reports that she did go to her pulmonologist in [place] and was prescribed Symbicort as a maintenance inhaler.  She reports that she has not used Symbicort because she is concerned about " overmedicating".  She has also received Flovent 110 from her primary care doctor and has not used this medication either.  She reports that she has been using Xopenex only which has provided her relief of her symptoms.  Allergic rhinitis is reported as poorly controlled with clear rhinorrhea and postnasal drainage.  She reports that she is experiencing a sore throat and itch and tightness in her throat which is worse in the morning before she gets up out of bed.  She reports that once she gets up and begins moving around the tightness and itching her throat decreases by at least two thirds.  She is not using any saline nasal spray or steroid nasal spray at this time.  She did not make a follow-up appointment with Dr. Benjamine Mola as she is interested in an ear nose and throat provider who can possibly give her Botox  injections for vocal cord dysfunction.  She reports that reflux is always an underlying problem, however, she has not had any reflux lately that has woken her up out of her sleep.  She is rarely using famotidine 20 mg.  She reports that she has used medications in the past including ranitidine and several PPIs.  She does have an appointment with a gastroenterologist later this month to evaluate the globus sensation.  Her current medications are listed in the chart.  Drug Allergies:  Allergies  Allergen Reactions  . Azithromycin Shortness Of Breath    Lip swelling, SOB.     . Ciprofloxacin Swelling    REACTION: tongue swells  . Codeine Shortness Of Breath  . Erythromycin Base Itching and Rash  . Milk-Related Compounds Swelling    Throat feels tight  . Mushroom Extract Complex Other (See Comments), Anaphylaxis and Rash    Didn't feel right Per allergist do not take  . Peanut Oil Anaphylaxis    Other reaction(s): Other (See Comments) Per allergist,do not take Other reaction(s): Other (See Comments) Per allergist,do not take Per allergist, do not take  . Sulfa Antibiotics Shortness Of Breath, Rash and Other (See Comments)  . Sulfasalazine Rash and Shortness Of Breath    Other reaction(s): Other (See Comments) Other reaction(s): SHORTNESS OF BREATH  . Telmisartan Swelling    Tongue swelling, Micardis  . Ace Inhibitors Cough  . Aspirin Hives and Other (See Comments)    flushing  . Avelox [Moxifloxacin  Hcl In Nacl] Itching       . Beta Adrenergic Blockers Other (See Comments)    Feels like chest tightening labetalol, bystolic  Feels like chest tightening "Metoprolol"   . Buspar [Buspirone] Other (See Comments)    Light headed  . Butorphanol Tartrate Other (See Comments)    Patient aggitated  . Cetirizine Hives and Rash       . Clonidine Hcl     REACTION: makes blood pressure high  . Cortisone     Feels like she is going crazy  . Erythromycin Rash  . Fentanyl Other (See  Comments)    aggressive   . Fluoxetine Hcl Other (See Comments)    REACTION: headaches  . Ketorolac Tromethamine     jittery  . Lidocaine Other (See Comments)    When it involves the throat,   . Lisinopril Cough  . Metoclopramide Hcl Other (See Comments)    Dystonic reaction  . Midazolam Other (See Comments)    agitation Slow to wake up  . Montelukast Other (See Comments)    Singulair  . Montelukast Sodium Other (See Comments)    DOES NOT REMEMBER  Don't remember-told not to take  . Naproxen Other (See Comments)    FLUSHING Pt states she took Ibuprofen today (10/08/19)  . Paroxetine Other (See Comments)    REACTION: headaches  . Penicillins Rash  . Pravastatin Other (See Comments)    Myalgias  . Promethazine Other (See Comments)    Dystonic reaction  . Promethazine Hcl Other (See Comments)    jittery  . Quinolones Swelling and Rash  . Serotonin Reuptake Inhibitors (Ssris) Other (See Comments)    Headache Effexor, prozac, zoloft,   . Sertraline Hcl     REACTION: headaches  . Stelazine [Trifluoperazine] Other (See Comments)    Dystonic reaction  . Tobramycin Itching and Rash  . Trifluoperazine Hcl     dystonic  . Whey     Milk allergy  . Atrovent Nasal Spray [Ipratropium]     Tachycardia and shaking  . Polyethylene Glycol 3350     Other reaction(s): Laryngeal Edema (ALLERGY)  . Propoxyphene   . Adhesive [Tape] Rash    EKG monitor patches, some tapes Blisters, rash, itching, welts.  . Butorphanol Anxiety    Patient agitated  . Ceftriaxone Rash    rocephin  . Iron Rash    Flushing with certain IV types  . Metoclopramide Itching and Other (See Comments)    Dystonic reaction  . Metronidazole Rash  . Other Rash and Other (See Comments)    Uncoded Allergy. Allergen: steriods, Other Reaction: Not Assessed Other reaction(s): Flushing (ALLERGY/intolerance), GI Upset (intolerance), Hypertension (intolerance), Increased Heart Rate (intolerance), Mental Status Changes  (intolerance), Other (See Comments), Tachycardia / Palpitations(intolerance) Hospital gowns leave a rash.   . Prednisone Anxiety and Palpitations  . Prochlorperazine Anxiety    Compazine:  Dystonic reaction  . Venlafaxine Anxiety  . Zyrtec [Cetirizine Hcl] Rash    All over body    Physical Exam: BP 120/88   Pulse 90   Temp (!) 96.8 F (36 C) (Temporal)   Resp 16   Ht 5\' 3"  (1.6 m)   Wt 213 lb (96.6 kg)   LMP 06/25/2013   SpO2 97%   BMI 37.73 kg/m    Physical Exam Vitals reviewed.  Constitutional:      Appearance: Normal appearance.  HENT:     Head: Normocephalic and atraumatic.     Right Ear: Tympanic membrane  normal.     Left Ear: Tympanic membrane normal.     Nose:     Comments: Bilateral nares slightly erythematous with clear nasal drainage noted.  Pharynx slightly erythematous with no exudate.  Ears normal.  Eyes normal.  Patient with frequent throat clearing throughout the exam Eyes:     Conjunctiva/sclera: Conjunctivae normal.  Cardiovascular:     Rate and Rhythm: Normal rate and regular rhythm.     Heart sounds: Normal heart sounds. No murmur heard.   Pulmonary:     Effort: Pulmonary effort is normal.     Breath sounds: Normal breath sounds.     Comments: Lungs clear to auscultation Musculoskeletal:        General: Normal range of motion.     Cervical back: Normal range of motion and neck supple.  Skin:    General: Skin is warm and dry.  Neurological:     Mental Status: She is alert and oriented to person, place, and time.  Psychiatric:     Comments: Pressured speech    Diagnostics: FVC 3.01, FEV1 2.65.  Predicted FVC 3.33, predicted FEV1 2.62.  Spirometry indicates normal ventilatory function.  Assessment and Plan: 1. Mild intermittent asthma, unspecified whether complicated   2. Other allergic rhinitis   3. Gastroesophageal reflux disease without esophagitis     Meds ordered this encounter  Medications  . fluticasone (FLONASE) 50 MCG/ACT  nasal spray    Sig: Place 2 sprays into both nostrils daily.    Dispense:  16 g    Refill:  5  . famotidine (PEPCID) 20 MG tablet    Sig: Take 1 tablet (20 mg total) by mouth 2 (two) times daily.    Dispense:  30 tablet    Refill:  5    Patient Instructions  Asthma Begin Flovent 110-2 puffs twice a day with a spacer to prevent cough and wheeze. Call the clinic if you develop a rash Continue Xopenex once every 4-6 hours as needed for cough or wheeze Follow up with your pulmonologist for further evaluation and treatment  Allergic rhinitis Restart Flonase 2 sprays in each nostril once a day for a stuffy nose.  In the right nostril, point the applicator out toward the right ear. In the left nostril, point the applicator out toward the left ear Consider saline nasal rinses as needed for nasal symptoms. Use this before any medicated nasal sprays for best result  Reflux Begin taking famotidine 20 mg twice a day to control reflux. Continue dietary and lifestyle modifications Follow up with your gastroenterologist at your scheduled appointment later this month  Call the clinic if this treatment plan is not working well for you  Follow up in 2 months or sooner if needed.    Return in about 2 months (around 08/21/2020), or if symptoms worsen or fail to improve.    Thank you for the opportunity to care for this patient.  Please do not hesitate to contact me with questions.  Gareth Morgan, FNP [facility]  ________________________________________________  I have provided oversight concerning Jennifer [name]'s evaluation and treatment of this patient's health issues addressed during today's encounter.  I agree with the assessment and therapeutic plan as outlined in the note.   Signed,   R Edgar Frisk, MD

## 2020-06-23 DIAGNOSIS — R519 Headache, unspecified: Secondary | ICD-10-CM | POA: Diagnosis not present

## 2020-06-23 DIAGNOSIS — E785 Hyperlipidemia, unspecified: Secondary | ICD-10-CM | POA: Diagnosis not present

## 2020-06-23 DIAGNOSIS — Z87891 Personal history of nicotine dependence: Secondary | ICD-10-CM | POA: Diagnosis not present

## 2020-06-23 DIAGNOSIS — E119 Type 2 diabetes mellitus without complications: Secondary | ICD-10-CM | POA: Diagnosis not present

## 2020-06-23 DIAGNOSIS — R1310 Dysphagia, unspecified: Secondary | ICD-10-CM | POA: Diagnosis not present

## 2020-06-23 DIAGNOSIS — R059 Cough, unspecified: Secondary | ICD-10-CM | POA: Diagnosis not present

## 2020-06-23 DIAGNOSIS — Z90710 Acquired absence of both cervix and uterus: Secondary | ICD-10-CM | POA: Diagnosis not present

## 2020-06-23 DIAGNOSIS — I10 Essential (primary) hypertension: Secondary | ICD-10-CM | POA: Diagnosis not present

## 2020-06-23 DIAGNOSIS — R221 Localized swelling, mass and lump, neck: Secondary | ICD-10-CM | POA: Diagnosis not present

## 2020-06-23 DIAGNOSIS — G35 Multiple sclerosis: Secondary | ICD-10-CM | POA: Diagnosis not present

## 2020-06-23 DIAGNOSIS — R0602 Shortness of breath: Secondary | ICD-10-CM | POA: Diagnosis not present

## 2020-06-23 DIAGNOSIS — E2609 Other primary hyperaldosteronism: Secondary | ICD-10-CM | POA: Diagnosis not present

## 2020-06-23 DIAGNOSIS — R9431 Abnormal electrocardiogram [ECG] [EKG]: Secondary | ICD-10-CM | POA: Diagnosis not present

## 2020-06-23 DIAGNOSIS — Z8542 Personal history of malignant neoplasm of other parts of uterus: Secondary | ICD-10-CM | POA: Diagnosis not present

## 2020-06-23 DIAGNOSIS — R070 Pain in throat: Secondary | ICD-10-CM | POA: Diagnosis not present

## 2020-06-23 DIAGNOSIS — K219 Gastro-esophageal reflux disease without esophagitis: Secondary | ICD-10-CM | POA: Diagnosis not present

## 2020-06-24 DIAGNOSIS — R070 Pain in throat: Secondary | ICD-10-CM | POA: Diagnosis not present

## 2020-06-24 DIAGNOSIS — R1310 Dysphagia, unspecified: Secondary | ICD-10-CM | POA: Diagnosis not present

## 2020-06-25 ENCOUNTER — Ambulatory Visit (INDEPENDENT_AMBULATORY_CARE_PROVIDER_SITE_OTHER): Payer: Medicare HMO | Admitting: Family Medicine

## 2020-06-25 ENCOUNTER — Encounter: Payer: Self-pay | Admitting: Family Medicine

## 2020-06-25 VITALS — BP 147/89 | HR 97 | Ht 63.0 in | Wt 214.3 lb

## 2020-06-25 DIAGNOSIS — R0989 Other specified symptoms and signs involving the circulatory and respiratory systems: Secondary | ICD-10-CM | POA: Diagnosis not present

## 2020-06-25 DIAGNOSIS — J301 Allergic rhinitis due to pollen: Secondary | ICD-10-CM

## 2020-06-25 DIAGNOSIS — R0602 Shortness of breath: Secondary | ICD-10-CM

## 2020-06-25 DIAGNOSIS — R1310 Dysphagia, unspecified: Secondary | ICD-10-CM

## 2020-06-25 MED ORDER — DEXAMETHASONE 0.75 MG PO TABS: 0.7500 mg | ORAL_TABLET | Freq: Two times a day (BID) | ORAL | 0 refills | Status: DC

## 2020-06-25 MED ORDER — FLONASE SENSIMIST 27.5 MCG/SPRAY NA SUSP: 2.0000 | Freq: Every day | NASAL | 1 refills | Status: DC
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
## 2020-06-28 ENCOUNTER — Telehealth: Payer: Self-pay

## 2020-06-28 DIAGNOSIS — Z91018 Allergy to other foods: Secondary | ICD-10-CM

## 2020-06-28 DIAGNOSIS — R531 Weakness: Secondary | ICD-10-CM | POA: Diagnosis not present

## 2020-06-28 DIAGNOSIS — R0602 Shortness of breath: Secondary | ICD-10-CM | POA: Diagnosis not present

## 2020-06-28 DIAGNOSIS — R0789 Other chest pain: Secondary | ICD-10-CM | POA: Diagnosis not present

## 2020-06-28 DIAGNOSIS — R079 Chest pain, unspecified: Secondary | ICD-10-CM | POA: Diagnosis not present

## 2020-06-28 DIAGNOSIS — R9431 Abnormal electrocardiogram [ECG] [EKG]: Secondary | ICD-10-CM | POA: Diagnosis not present

## 2020-06-28 DIAGNOSIS — Z20828 Contact with and (suspected) exposure to other viral communicable diseases: Secondary | ICD-10-CM | POA: Diagnosis not present

## 2020-06-28 DIAGNOSIS — R0989 Other specified symptoms and signs involving the circulatory and respiratory systems: Secondary | ICD-10-CM

## 2020-06-28 NOTE — Telephone Encounter (Signed)
Patient called stating she experienced some more of the sensations she explained to Dr Madilyn Fireman at her appt last week after eating some bread. She wanted to check and see if there was any update on the food panel lab that Dr Madilyn Fireman discussed ordering

## 2020-06-29 DIAGNOSIS — R079 Chest pain, unspecified: Secondary | ICD-10-CM | POA: Diagnosis not present

## 2020-06-29 DIAGNOSIS — R0602 Shortness of breath: Secondary | ICD-10-CM | POA: Diagnosis not present

## 2020-06-30 ENCOUNTER — Encounter: Payer: Self-pay | Admitting: *Deleted

## 2020-06-30 ENCOUNTER — Emergency Department (HOSPITAL_BASED_OUTPATIENT_CLINIC_OR_DEPARTMENT_OTHER): Admission: EM | Admit: 2020-06-30 | Discharge: 2020-06-30 | Discharge status: Absent without leave | Payer: Medicare HMO

## 2020-06-30 ENCOUNTER — Other Ambulatory Visit: Payer: Self-pay

## 2020-06-30 DIAGNOSIS — Z91018 Allergy to other foods: Secondary | ICD-10-CM | POA: Diagnosis not present

## 2020-06-30 DIAGNOSIS — J4551 Severe persistent asthma with (acute) exacerbation: Secondary | ICD-10-CM | POA: Diagnosis not present

## 2020-06-30 DIAGNOSIS — Z885 Allergy status to narcotic agent status: Secondary | ICD-10-CM | POA: Diagnosis not present

## 2020-06-30 DIAGNOSIS — F419 Anxiety disorder, unspecified: Secondary | ICD-10-CM | POA: Diagnosis not present

## 2020-06-30 DIAGNOSIS — Z888 Allergy status to other drugs, medicaments and biological substances status: Secondary | ICD-10-CM | POA: Diagnosis not present

## 2020-06-30 DIAGNOSIS — Z881 Allergy status to other antibiotic agents status: Secondary | ICD-10-CM | POA: Diagnosis not present

## 2020-06-30 DIAGNOSIS — Z886 Allergy status to analgesic agent status: Secondary | ICD-10-CM | POA: Diagnosis not present

## 2020-06-30 DIAGNOSIS — Z79899 Other long term (current) drug therapy: Secondary | ICD-10-CM | POA: Diagnosis not present

## 2020-06-30 DIAGNOSIS — R9431 Abnormal electrocardiogram [ECG] [EKG]: Secondary | ICD-10-CM | POA: Diagnosis not present

## 2020-06-30 DIAGNOSIS — R079 Chest pain, unspecified: Secondary | ICD-10-CM | POA: Diagnosis not present

## 2020-07-01 DIAGNOSIS — R490 Dysphonia: Secondary | ICD-10-CM | POA: Diagnosis not present

## 2020-07-01 DIAGNOSIS — I10 Essential (primary) hypertension: Secondary | ICD-10-CM | POA: Diagnosis not present

## 2020-07-01 DIAGNOSIS — R198 Other specified symptoms and signs involving the digestive system and abdomen: Secondary | ICD-10-CM | POA: Diagnosis not present

## 2020-07-01 DIAGNOSIS — F458 Other somatoform disorders: Secondary | ICD-10-CM | POA: Diagnosis not present

## 2020-07-01 DIAGNOSIS — Z79899 Other long term (current) drug therapy: Secondary | ICD-10-CM | POA: Diagnosis not present

## 2020-07-01 DIAGNOSIS — Z8709 Personal history of other diseases of the respiratory system: Secondary | ICD-10-CM | POA: Diagnosis not present

## 2020-07-01 DIAGNOSIS — R0602 Shortness of breath: Secondary | ICD-10-CM | POA: Diagnosis not present

## 2020-07-01 DIAGNOSIS — R0989 Other specified symptoms and signs involving the circulatory and respiratory systems: Secondary | ICD-10-CM | POA: Diagnosis not present

## 2020-07-01 DIAGNOSIS — Z91018 Allergy to other foods: Secondary | ICD-10-CM | POA: Diagnosis not present

## 2020-07-01 DIAGNOSIS — R070 Pain in throat: Secondary | ICD-10-CM | POA: Diagnosis not present

## 2020-07-01 LAB — COMPREHENSIVE METABOLIC PANEL
Calcium: 9.3 (ref 8.7–10.7)
GFR calc non Af Amer: >=90

## 2020-07-01 LAB — BASIC METABOLIC PANEL
BUN: 11 (ref 4–21)
CO2: 26 — AB (ref 13–22)
Chloride: 108 (ref 99–108)
Creatinine: 0.6 (ref 0.5–1.1)
Glucose: 100
Potassium: 3.9 (ref 3.4–5.3)
Sodium: 141 (ref 137–147)

## 2020-07-02 ENCOUNTER — Telehealth: Payer: Self-pay | Admitting: Family Medicine

## 2020-07-02 ENCOUNTER — Other Ambulatory Visit: Payer: Self-pay

## 2020-07-02 ENCOUNTER — Ambulatory Visit (INDEPENDENT_AMBULATORY_CARE_PROVIDER_SITE_OTHER): Payer: Medicare HMO | Admitting: Family Medicine

## 2020-07-02 ENCOUNTER — Encounter: Payer: Self-pay | Admitting: Family Medicine

## 2020-07-02 VITALS — BP 147/84 | HR 87 | Temp 99.1°F | Ht 63.0 in | Wt 214.9 lb

## 2020-07-02 DIAGNOSIS — K220 Achalasia of cardia: Secondary | ICD-10-CM

## 2020-07-02 DIAGNOSIS — R0989 Other specified symptoms and signs involving the circulatory and respiratory systems: Secondary | ICD-10-CM | POA: Diagnosis not present

## 2020-07-02 DIAGNOSIS — K5909 Other constipation: Secondary | ICD-10-CM

## 2020-07-02 DIAGNOSIS — R7981 Abnormal blood-gas level: Secondary | ICD-10-CM

## 2020-07-02 DIAGNOSIS — F339 Major depressive disorder, recurrent, unspecified: Secondary | ICD-10-CM

## 2020-07-02 DIAGNOSIS — R1013 Epigastric pain: Secondary | ICD-10-CM

## 2020-07-02 DIAGNOSIS — I10 Essential (primary) hypertension: Secondary | ICD-10-CM
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
## 2020-07-02 NOTE — Patient Instructions (Addendum)
Low-Sodium Eating Plan Sodium, which is an element that makes up salt, helps you maintain a healthy balance of fluids in your body. Too much sodium can increase your blood pressure and cause fluid and waste to be held in your body. Your health care provider or dietitian may recommend following this plan if you have high blood pressure (hypertension), kidney disease, liver disease, or heart failure. Eating less sodium can help lower your blood pressure, reduce swelling, and protect your heart, liver, and kidneys. What are tips for following this plan? General guidelines  Most people on this plan should limit their sodium intake to 1,500-2,000 mg (milligrams) of sodium each day. Reading food labels   The Nutrition Facts label lists the amount of sodium in one serving of the food. If you eat more than one serving, you must multiply the listed amount of sodium by the number of servings.  Choose foods with less than 140 mg of sodium per serving.  Avoid foods with 300 mg of sodium or more per serving. Shopping  Look for lower-sodium products, often labeled as "low-sodium" or "no salt added."  Always check the sodium content even if foods are labeled as "unsalted" or "no salt added".  Buy fresh foods. ? Avoid canned foods and premade or frozen meals. ? Avoid canned, cured, or processed meats  Buy breads that have less than 80 mg of sodium per slice. Cooking  Eat more home-cooked food and less restaurant, buffet, and fast food.  Avoid adding salt when cooking. Use salt-free seasonings or herbs instead of table salt or sea salt. Check with your health care provider or pharmacist before using salt substitutes.  Cook with plant-based oils, such as canola, sunflower, or olive oil. Meal planning  When eating at a restaurant, ask that your food be prepared with less salt or no salt, if possible.  Avoid foods that contain MSG (monosodium glutamate). MSG is sometimes added to Chinese food,  bouillon, and some canned foods. What foods are recommended? The items listed may not be a complete list. Talk with your dietitian about what dietary choices are best for you. Grains Low-sodium cereals, including oats, puffed wheat and rice, and shredded wheat. Low-sodium crackers. Unsalted rice. Unsalted pasta. Low-sodium bread. Whole-grain breads and whole-grain pasta. Vegetables Fresh or frozen vegetables. "No salt added" canned vegetables. "No salt added" tomato sauce and paste. Low-sodium or reduced-sodium tomato and vegetable juice. Fruits Fresh, frozen, or canned fruit. Fruit juice. Meats and other protein foods Fresh or frozen (no salt added) meat, poultry, seafood, and fish. Low-sodium canned tuna and salmon. Unsalted nuts. Dried peas, beans, and lentils without added salt. Unsalted canned beans. Eggs. Unsalted nut butters. Dairy Milk. Soy milk. Cheese that is naturally low in sodium, such as ricotta cheese, fresh mozzarella, or Swiss cheese Low-sodium or reduced-sodium cheese. Cream cheese. Yogurt. Fats and oils Unsalted butter. Unsalted margarine with no trans fat. Vegetable oils such as canola or olive oils. Seasonings and other foods Fresh and dried herbs and spices. Salt-free seasonings. Low-sodium mustard and ketchup. Sodium-free salad dressing. Sodium-free light mayonnaise. Fresh or refrigerated horseradish. Lemon juice. Vinegar. Homemade, reduced-sodium, or low-sodium soups. Unsalted popcorn and pretzels. Low-salt or salt-free chips. What foods are not recommended? The items listed may not be a complete list. Talk with your dietitian about what dietary choices are best for you. Grains Instant hot cereals. Bread stuffing, pancake, and biscuit mixes. Croutons. Seasoned rice or pasta mixes. Noodle soup cups. Boxed or frozen macaroni and cheese. Regular salted   crackers. Self-rising flour. Vegetables Sauerkraut, pickled vegetables, and relishes. Olives. French fries. Onion rings.  Regular canned vegetables (not low-sodium or reduced-sodium). Regular canned tomato sauce and paste (not low-sodium or reduced-sodium). Regular tomato and vegetable juice (not low-sodium or reduced-sodium). Frozen vegetables in sauces. Meats and other protein foods Meat or fish that is salted, canned, smoked, spiced, or pickled. Bacon, ham, sausage, hotdogs, corned beef, chipped beef, packaged lunch meats, salt pork, jerky, pickled herring, anchovies, regular canned tuna, sardines, salted nuts. Dairy Processed cheese and cheese spreads. Cheese curds. Blue cheese. Feta cheese. String cheese. Regular cottage cheese. Buttermilk. Canned milk. Fats and oils Salted butter. Regular margarine. Ghee. Bacon fat. Seasonings and other foods Onion salt, garlic salt, seasoned salt, table salt, and sea salt. Canned and packaged gravies. Worcestershire sauce. Tartar sauce. Barbecue sauce. Teriyaki sauce. Soy sauce, including reduced-sodium. Steak sauce. Fish sauce. Oyster sauce. Cocktail sauce. Horseradish that you find on the shelf. Regular ketchup and mustard. Meat flavorings and tenderizers. Bouillon cubes. Hot sauce and Tabasco sauce. Premade or packaged marinades. Premade or packaged taco seasonings. Relishes. Regular salad dressings. Salsa. Potato and tortilla chips. Corn chips and puffs. Salted popcorn and pretzels. Canned or dried soups. Pizza. Frozen entrees and pot pies. Summary  Eating less sodium can help lower your blood pressure, reduce swelling, and protect your heart, liver, and kidneys.  Most people on this plan should limit their sodium intake to 1,500-2,000 mg (milligrams) of sodium each day.  Canned, boxed, and frozen foods are high in sodium. Restaurant foods, fast foods, and pizza are also very high in sodium. You also get sodium by adding salt to food.  Try to cook at home, eat more fresh fruits and vegetables, and eat less fast food, canned, processed, or prepared foods. This information is  not intended to replace advice given to you by your health care provider. Make sure you discuss any questions you have with your health care provider. Document Revised: 07/13/2017 Document Reviewed: 07/24/2016 Elsevier Patient Education  2020 Elsevier Inc.  

## 2020-07-02 NOTE — Telephone Encounter (Signed)
Patient is asking about CO levels, she wanted someone to tell her why they were high. After speaking Kenney Houseman, she said she can't let her know until Dr. Madilyn Fireman looks at it and results it. She became very upset saying, "so no one is calling me today basically? How am I supposed to go through the weekend when I'm freaking out about this. I need to know why". I told the patient that after Dr. Madilyn Fireman looks at the results one of the CMA's will let her know them. She got even more upset, and hung up.

## 2020-07-03 ENCOUNTER — Encounter: Payer: Self-pay | Admitting: Family Medicine

## 2020-07-03 DIAGNOSIS — R0602 Shortness of breath: Secondary | ICD-10-CM | POA: Diagnosis not present

## 2020-07-03 DIAGNOSIS — R9431 Abnormal electrocardiogram [ECG] [EKG]: Secondary | ICD-10-CM | POA: Diagnosis not present

## 2020-07-03 DIAGNOSIS — R1084 Generalized abdominal pain: Secondary | ICD-10-CM | POA: Diagnosis not present

## 2020-07-03 LAB — IGG ALLERGENS (92) FOODS
Casein IgG: 12.2 ug/mL — ABNORMAL HIGH (ref 0.0–1.9)
Cheese, Mold Type IgG: 16.3 ug/mL — ABNORMAL HIGH (ref 0.0–1.9)
Chicken IgG: <2 ug/mL (ref 0.0–1.9)
Chili Pepper IgG: 3.6 ug/mL — ABNORMAL HIGH (ref 0.0–1.9)
Chocolate/Cacao IgG: <2 ug/mL (ref 0.0–1.9)
Coffee IgG: <2 ug/mL (ref 0.0–1.9)
Corn IgG: <2 ug/mL (ref 0.0–1.9)
Egg, Whole IgG: 6.2 ug/mL — ABNORMAL HIGH (ref 0.0–1.9)
F003-IgG Codfish: <2 ug/mL (ref 0.0–1.9)
F006-IgG Barley, Whole: 4.7 ug/mL — ABNORMAL HIGH (ref 0.0–1.9)
F009-IgG Rice: <2 ug/mL (ref 0.0–1.9)
F010-IgG Sesame Seed: 2.6 ug/mL — ABNORMAL HIGH (ref 0.0–1.9)
F012-IgG Green Pea: <2 ug/mL (ref 0.0–1.9)
F015-IgG White Bean: <2 ug/mL (ref 0.0–1.9)
F020-IgG Almond: <2 ug/mL (ref 0.0–1.9)
F023-IgG Crab: <2 ug/mL (ref 0.0–1.9)
F027-IgG Beef: 7.6 ug/mL — ABNORMAL HIGH (ref 0.0–1.9)
F031-IgG Carrot: <2 ug/mL (ref 0.0–1.9)
F033-IgG Orange: <2 ug/mL (ref 0.0–1.9)
F040-IgG Tuna: <2 ug/mL (ref 0.0–1.9)
F041-IgG Salmon: <2 ug/mL (ref 0.0–1.9)
F044-IgG Strawberry: 2.5 ug/mL — ABNORMAL HIGH (ref 0.0–1.9)
F047-IgG Garlic: 7.6 ug/mL — ABNORMAL HIGH (ref 0.0–1.9)
F049-IgG Apple: <2 ug/mL (ref 0.0–1.9)
F054-IgG Sweet Potato: <2 ug/mL (ref 0.0–1.9)
F080-IgG Lobster: <2 ug/mL (ref 0.0–1.9)
F085-IgG Celery: <2 ug/mL (ref 0.0–1.9)
F087-IgG Melon: <2 ug/mL (ref 0.0–1.9)
F089-IgG Mustard: <2 ug/mL (ref 0.0–1.9)
F092-IgG Banana: 8.2 ug/mL — ABNORMAL HIGH (ref 0.0–1.9)
F094-IgG Pear: <2 ug/mL (ref 0.0–1.9)
F095-IgG Peach: <2 ug/mL (ref 0.0–1.9)
F096-IgG Avocado: <2 ug/mL (ref 0.0–1.9)
F147-IgG Flounder: <2 ug/mL (ref 0.0–1.9)
F204-IgG Trout: <2 ug/mL (ref 0.0–1.9)
F207-IgG Clam: 2.3 ug/mL — ABNORMAL HIGH (ref 0.0–1.9)
F208-IgG Lemon: 2.2 ug/mL — ABNORMAL HIGH (ref 0.0–1.9)
F209-IgG Grapefruit: <2 ug/mL (ref 0.0–1.9)
F210-IgG Pineapple: 2.4 ug/mL — ABNORMAL HIGH (ref 0.0–1.9)
F212-IgG Mushroom: 4.9 ug/mL — ABNORMAL HIGH (ref 0.0–1.9)
F214-IgG Spinach: <2 ug/mL (ref 0.0–1.9)
F215-IgG Lettuce: 2 ug/mL — ABNORMAL HIGH (ref 0.0–1.9)
F216-IgG Cabbage: <2 ug/mL (ref 0.0–1.9)
F217-IgG Brussel Sprouts: <2 ug/mL (ref 0.0–1.9)
F220-IgG Cinnamon: <2 ug/mL (ref 0.0–1.9)
F222-IgG Tea: 2.2 ug/mL — ABNORMAL HIGH (ref 0.0–1.9)
F225-IgG Pumpkin: <2 ug/mL (ref 0.0–1.9)
F234-IgG Vanilla: 2.8 ug/mL — ABNORMAL HIGH (ref 0.0–1.9)
F237-IgG Apricot: <2 ug/mL (ref 0.0–1.9)
F242-IgG Bing Cherry: <2 ug/mL (ref 0.0–1.9)
F244-IgG Cucumber: 2.7 ug/mL — ABNORMAL HIGH (ref 0.0–1.9)
F255-IgG Plum: <2 ug/mL (ref 0.0–1.9)
F259-IgG Grape: <2 ug/mL (ref 0.0–1.9)
F260-IgG Broccoli: <2 ug/mL (ref 0.0–1.9)
F261-IgG Asparagus: <2 ug/mL (ref 0.0–1.9)
F262-IgG Eggplant: <2 ug/mL (ref 0.0–1.9)
F263-IgG Green Bell Pepper: <2 ug/mL (ref 0.0–1.9)
F268-IgG Cloves: 3.7 ug/mL — ABNORMAL HIGH (ref 0.0–1.9)
F270-IgG Ginger: <2 ug/mL (ref 0.0–1.9)
F280-IgG Black Pepper: 14.5 ug/mL — ABNORMAL HIGH (ref 0.0–1.9)
F282-IgG Nutmeg: <2 ug/mL (ref 0.0–1.9)
F283-IgG Oregano: 2.6 ug/mL — ABNORMAL HIGH (ref 0.0–1.9)
F284-IgG Turkey: <2 ug/mL (ref 0.0–1.9)
F287-IgG Kidney Bean: <2 ug/mL (ref 0.0–1.9)
F288-IgG Blueberry: 2.1 ug/mL — ABNORMAL HIGH (ref 0.0–1.9)
F290-IgG Oyster: <2 ug/mL (ref 0.0–1.9)
F291-IgG Cauliflower: <2 ug/mL (ref 0.0–1.9)
F302-IgG Tangerine: <2 ug/mL (ref 0.0–1.9)
F303-IgG Halibut: <2 ug/mL (ref 0.0–1.9)
F306-IgG Lime: <2 ug/mL (ref 0.0–1.9)
F329-IgG Watermelon: <2 ug/mL (ref 0.0–1.9)
F337-IgG Sole: <2 ug/mL (ref 0.0–1.9)
F338-IgG Scallop: <2 ug/mL (ref 0.0–1.9)
F342-IgG Olive, Black: 3.8 ug/mL — ABNORMAL HIGH (ref 0.0–1.9)
F344-IgG Sage, Salvia: 5 ug/mL — ABNORMAL HIGH (ref 0.0–1.9)
F381-IgG Red Snapper: <2 ug/mL (ref 0.0–1.9)
F384-IgG Whitefish: <2 ug/mL (ref 0.0–1.9)
Green Bean IgG: <2 ug/mL (ref 0.0–1.9)
Haddock IgG: <2 ug/mL (ref 0.0–1.9)
K084-IgG Sunflower Seed: <2 ug/mL (ref 0.0–1.9)
Lamb IgG: <2 ug/mL (ref 0.0–1.9)
Oat IgG: 3.6 ug/mL — ABNORMAL HIGH (ref 0.0–1.9)
Onion IgG: <2 ug/mL (ref 0.0–1.9)
Peanut IgG: <2 ug/mL (ref 0.0–1.9)
Pork IgG: 5.1 ug/mL — ABNORMAL HIGH (ref 0.0–1.9)
Potato, White, IgG: <2 ug/mL (ref 0.0–1.9)
Rye IgG: 6.3 ug/mL — ABNORMAL HIGH (ref 0.0–1.9)
Shrimp IgG: <2 ug/mL (ref 0.0–1.9)
Soybean IgG: <2 ug/mL (ref 0.0–1.9)
Tomato IgG: <2 ug/mL (ref 0.0–1.9)
Wheat IgG: 8.3 ug/mL — ABNORMAL HIGH (ref 0.0–1.9)
Yeast IgG: <2 ug/mL (ref 0.0–1.9)

## 2020-07-04 NOTE — Telephone Encounter (Signed)
It looks like it is similar to what it was a month ago but it looks like our range and their range is different. Their range is normal up to 30 so it is within their labs normal range.

## 2020-07-05 ENCOUNTER — Encounter: Payer: Self-pay | Admitting: Family Medicine

## 2020-07-05 DIAGNOSIS — R9431 Abnormal electrocardiogram [ECG] [EKG]: Secondary | ICD-10-CM | POA: Diagnosis not present

## 2020-07-05 NOTE — Assessment & Plan Note (Signed)
Working with ENT. They recommended speech therapy.

## 2020-07-05 NOTE — Assessment & Plan Note (Signed)
Appointment with GI.

## 2020-07-05 NOTE — Assessment & Plan Note (Signed)
Scheduled for modified barium swallow in about 2 weeks.

## 2020-07-05 NOTE — Assessment & Plan Note (Signed)
Still like to see her get in with a therapist I think this could really be helpful for her long-term.

## 2020-07-05 NOTE — Assessment & Plan Note (Signed)
Going to place cardiology referral today. Reminded her to stick with a strict low-sodium, less than 50 mg/day, diet. This would be extremely helpful and can sometimes reduce blood pressure by about 5 points which would be helpful. Hopefully cardiology can also be helpful in helping her find a blood pressure regimen that will work. Encouraged continue to work on healthy diet with mostly vegetables and lean proteins, low carbs and low processed foods.

## 2020-07-05 NOTE — Assessment & Plan Note (Signed)
Is moving forward with the food sensitivity testing. Ordered an IgG panel once we get those results back then we can focus on foods that might be triggering some of her symptoms.

## 2020-07-05 NOTE — Assessment & Plan Note (Signed)
This is more chronic. Patient was concerned about recent elevated CO2 but based on labs done at [facility] this is within their normal range.

## 2020-07-06 DIAGNOSIS — R0789 Other chest pain: Secondary | ICD-10-CM | POA: Diagnosis not present

## 2020-07-06 DIAGNOSIS — R0602 Shortness of breath: Secondary | ICD-10-CM | POA: Diagnosis not present

## 2020-07-06 DIAGNOSIS — R079 Chest pain, unspecified: Secondary | ICD-10-CM | POA: Diagnosis not present

## 2020-07-06 DIAGNOSIS — R9431 Abnormal electrocardiogram [ECG] [EKG]: Secondary | ICD-10-CM | POA: Diagnosis not present

## 2020-07-06 DIAGNOSIS — I10 Essential (primary) hypertension: Secondary | ICD-10-CM | POA: Diagnosis not present

## 2020-07-06 DIAGNOSIS — I471 Supraventricular tachycardia: Secondary | ICD-10-CM | POA: Diagnosis not present

## 2020-07-06 DIAGNOSIS — I493 Ventricular premature depolarization: Secondary | ICD-10-CM | POA: Diagnosis not present

## 2020-07-06 DIAGNOSIS — R2232 Localized swelling, mass and lump, left upper limb: Secondary | ICD-10-CM | POA: Diagnosis not present

## 2020-07-06 DIAGNOSIS — Z87891 Personal history of nicotine dependence: Secondary | ICD-10-CM | POA: Diagnosis not present

## 2020-07-06 DIAGNOSIS — E785 Hyperlipidemia, unspecified: Secondary | ICD-10-CM | POA: Diagnosis not present

## 2020-07-06 DIAGNOSIS — M79602 Pain in left arm: Secondary | ICD-10-CM | POA: Diagnosis not present

## 2020-07-06 NOTE — Telephone Encounter (Signed)
Sent My-Chart message

## 2020-07-06 NOTE — Telephone Encounter (Signed)
Please see previous note in regards to answer about the BMP. We have different range bc we have different labs running the test.  I am not sure which she is asking about the allergy testing.  The one that we did was for food sensitivities.  In other words if she has IgG which are more long-term antibodies and that means that when she eats these foods it is rubbing up her immune system and causing a sensitivity.  She can always discuss doing more detailed food "allergy" testing with her allergist.

## 2020-07-08 ENCOUNTER — Emergency Department (HOSPITAL_BASED_OUTPATIENT_CLINIC_OR_DEPARTMENT_OTHER)
Admission: EM | Admit: 2020-07-08 | Discharge: 2020-07-09 | Disposition: A | Discharge status: Home / Self Care | Payer: Medicare HMO | Attending: Emergency Medicine | Admitting: Emergency Medicine

## 2020-07-08 ENCOUNTER — Other Ambulatory Visit: Payer: Self-pay

## 2020-07-08 ENCOUNTER — Encounter (HOSPITAL_BASED_OUTPATIENT_CLINIC_OR_DEPARTMENT_OTHER): Payer: Self-pay

## 2020-07-08 DIAGNOSIS — K219 Gastro-esophageal reflux disease without esophagitis: Secondary | ICD-10-CM | POA: Insufficient documentation

## 2020-07-08 DIAGNOSIS — Z87891 Personal history of nicotine dependence: Secondary | ICD-10-CM | POA: Insufficient documentation

## 2020-07-08 DIAGNOSIS — J4551 Severe persistent asthma with (acute) exacerbation: Secondary | ICD-10-CM | POA: Insufficient documentation

## 2020-07-08 DIAGNOSIS — J45909 Unspecified asthma, uncomplicated: Secondary | ICD-10-CM | POA: Diagnosis not present

## 2020-07-08 DIAGNOSIS — F419 Anxiety disorder, unspecified: Secondary | ICD-10-CM | POA: Diagnosis not present

## 2020-07-08 DIAGNOSIS — I10 Essential (primary) hypertension: Secondary | ICD-10-CM | POA: Diagnosis not present

## 2020-07-08 DIAGNOSIS — Z7951 Long term (current) use of inhaled steroids: Secondary | ICD-10-CM | POA: Insufficient documentation

## 2020-07-08 DIAGNOSIS — Z79899 Other long term (current) drug therapy: Secondary | ICD-10-CM | POA: Insufficient documentation

## 2020-07-08 DIAGNOSIS — Z8542 Personal history of malignant neoplasm of other parts of uterus: Secondary | ICD-10-CM | POA: Insufficient documentation

## 2020-07-08 DIAGNOSIS — R1084 Generalized abdominal pain: Secondary | ICD-10-CM | POA: Insufficient documentation

## 2020-07-08 DIAGNOSIS — K5900 Constipation, unspecified: Secondary | ICD-10-CM | POA: Diagnosis not present

## 2020-07-08 DIAGNOSIS — Z8616 Personal history of COVID-19: Secondary | ICD-10-CM | POA: Insufficient documentation

## 2020-07-08 DIAGNOSIS — R109 Unspecified abdominal pain: Secondary | ICD-10-CM

## 2020-07-08 DIAGNOSIS — G8929 Other chronic pain: Secondary | ICD-10-CM | POA: Insufficient documentation

## 2020-07-08 MED ORDER — DICYCLOMINE HCL 20 MG PO TABS: 20.0000 mg | ORAL_TABLET | Freq: Two times a day (BID) | ORAL | 0 refills | Status: DC

## 2020-07-08 MED ORDER — DICYCLOMINE HCL 10 MG PO CAPS
20.0000 mg | ORAL_CAPSULE | Freq: Once | ORAL | Status: AC
  Administered 2020-07-09: 20 mg via ORAL
  Filled 2020-07-08: qty 2
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
## 2020-07-08 NOTE — ED Triage Notes (Addendum)
Pt c/o abd pain-states she is waiting to be seen by GI-was seen by [facility] ED today-NAD-steady gait

## 2020-07-08 NOTE — Discharge Instructions (Signed)
Please take Bentyl as prescribed.  Please keep your appointment tomorrow morning with your gastroenterologist.  Please drink plenty of water.

## 2020-07-09 DIAGNOSIS — R1310 Dysphagia, unspecified: Secondary | ICD-10-CM | POA: Diagnosis not present

## 2020-07-09 DIAGNOSIS — R194 Change in bowel habit: Secondary | ICD-10-CM | POA: Diagnosis not present

## 2020-07-13 ENCOUNTER — Encounter: Payer: Self-pay | Admitting: Family Medicine

## 2020-07-13 DIAGNOSIS — R000 Tachycardia, unspecified: Secondary | ICD-10-CM | POA: Diagnosis not present

## 2020-07-13 DIAGNOSIS — R42 Dizziness and giddiness: Secondary | ICD-10-CM | POA: Diagnosis not present

## 2020-07-13 DIAGNOSIS — R002 Palpitations: Secondary | ICD-10-CM | POA: Diagnosis not present

## 2020-07-13 DIAGNOSIS — R1310 Dysphagia, unspecified: Secondary | ICD-10-CM | POA: Diagnosis not present

## 2020-07-13 DIAGNOSIS — Z79899 Other long term (current) drug therapy: Secondary | ICD-10-CM | POA: Diagnosis not present

## 2020-07-13 LAB — COMPREHENSIVE METABOLIC PANEL
Calcium: 9 (ref 8.7–10.7)
GFR calc non Af Amer: 69

## 2020-07-13 LAB — BASIC METABOLIC PANEL
BUN: 14 (ref 4–21)
CO2: 26 — AB (ref 13–22)
Chloride: 106 (ref 99–108)
Creatinine: 0.9 (ref 0.5–1.1)
Glucose: 191
Potassium: 3.6 (ref 3.4–5.3)
Sodium: 141 (ref 137–147)

## 2020-07-14 DIAGNOSIS — J383 Other diseases of vocal cords: Secondary | ICD-10-CM | POA: Diagnosis not present

## 2020-07-14 DIAGNOSIS — E669 Obesity, unspecified: Secondary | ICD-10-CM | POA: Diagnosis not present

## 2020-07-14 DIAGNOSIS — Z6839 Body mass index (BMI) 39.0-39.9, adult: Secondary | ICD-10-CM | POA: Diagnosis not present

## 2020-07-14 DIAGNOSIS — J45909 Unspecified asthma, uncomplicated: Secondary | ICD-10-CM | POA: Diagnosis not present

## 2020-07-16 ENCOUNTER — Ambulatory Visit (INDEPENDENT_AMBULATORY_CARE_PROVIDER_SITE_OTHER): Payer: Medicare HMO | Admitting: Family Medicine

## 2020-07-16 ENCOUNTER — Other Ambulatory Visit: Payer: Self-pay

## 2020-07-16 VITALS — BP 151/74 | HR 84 | Ht 63.0 in | Wt 215.0 lb

## 2020-07-16 DIAGNOSIS — R21 Rash and other nonspecific skin eruption: Secondary | ICD-10-CM | POA: Diagnosis not present

## 2020-07-16 DIAGNOSIS — I10 Essential (primary) hypertension: Secondary | ICD-10-CM | POA: Diagnosis not present

## 2020-07-16 DIAGNOSIS — L659 Nonscarring hair loss, unspecified: Secondary | ICD-10-CM | POA: Diagnosis not present

## 2020-07-16 DIAGNOSIS — R2233 Localized swelling, mass and lump, upper limb, bilateral: Secondary | ICD-10-CM

## 2020-07-16 DIAGNOSIS — J309 Allergic rhinitis, unspecified: Secondary | ICD-10-CM | POA: Diagnosis not present

## 2020-07-16 DIAGNOSIS — G35 Multiple sclerosis: Secondary | ICD-10-CM

## 2020-07-16 DIAGNOSIS — T7800XD Anaphylactic reaction due to unspecified food, subsequent encounter: Secondary | ICD-10-CM | POA: Diagnosis not present

## 2020-07-16 MED ORDER — MINOXIDIL 5 % EX SOLN: 1.0000 "application " | Freq: Two times a day (BID) | CUTANEOUS | 2 refills | Status: DC

## 2020-07-16 MED ORDER — FINASTERIDE 5 MG PO TABS: 5.0000 mg | ORAL_TABLET | Freq: Every day | ORAL | 2 refills | Status: DC
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
## 2020-07-18 IMAGING — DX DG RIBS W/ CHEST 3+V*L*
4 series · 4 of 4 positions shown · non-contrast
Comparison: Chest x-ray dated 04/02/2019

CLINICAL DATA: Left posterior rib pain secondary to a fall
yesterday.

EXAM:
LEFT RIBS AND CHEST - 3+ VIEW

[chest pa]
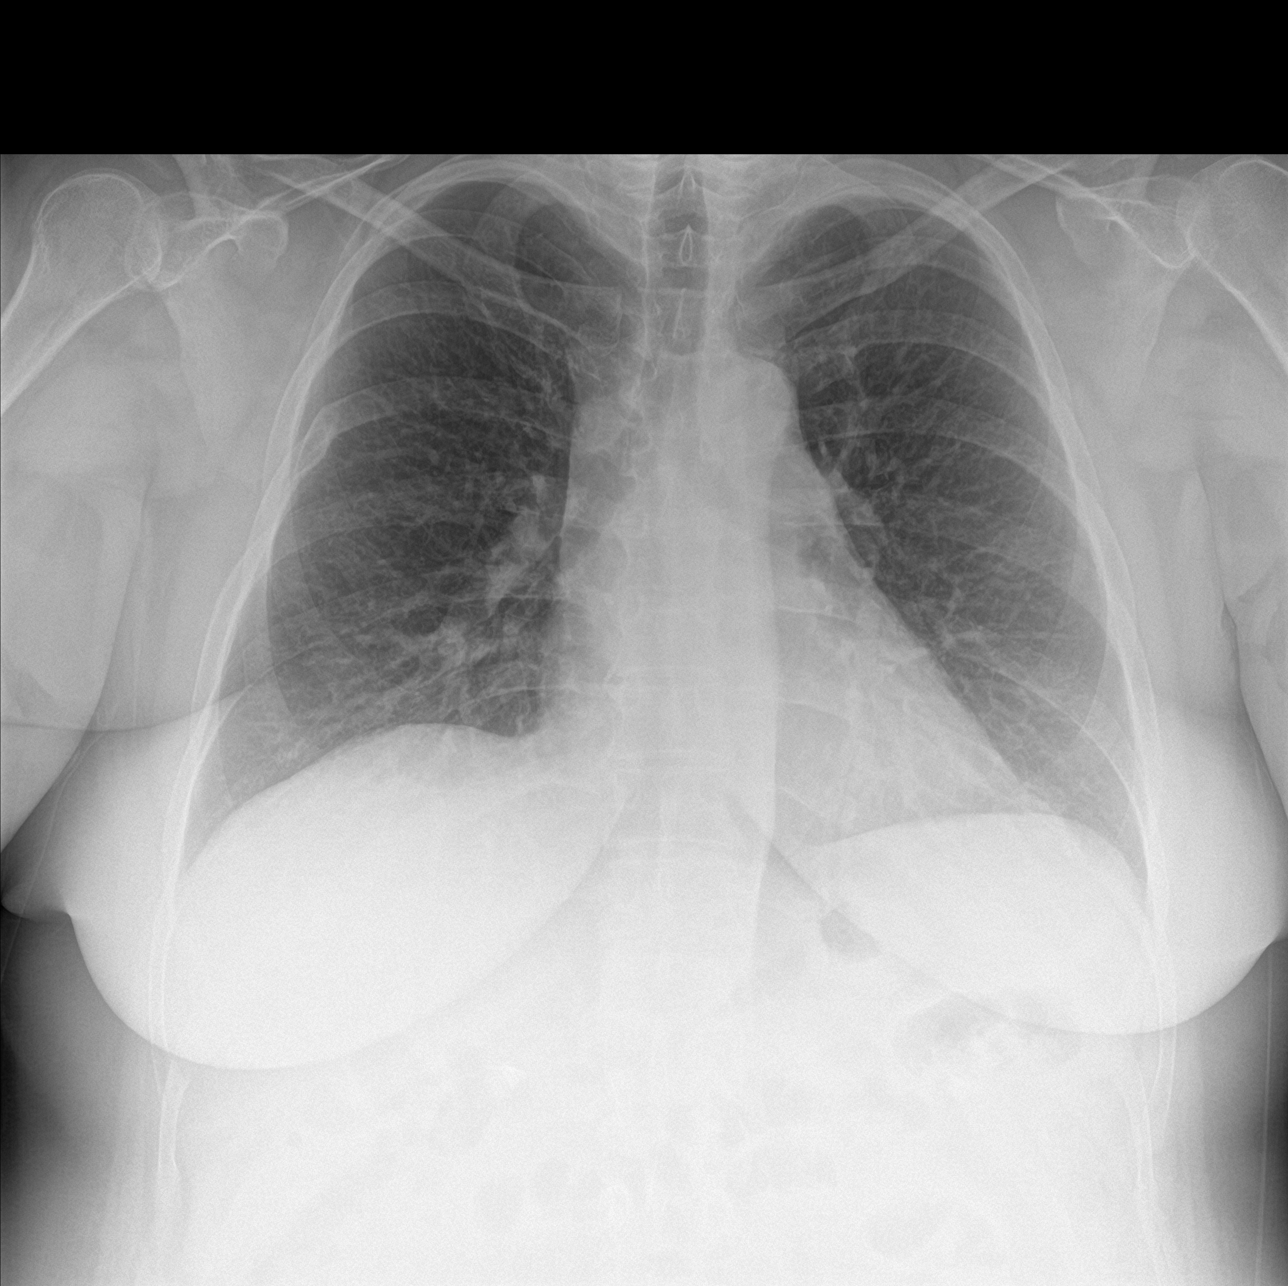

[rib ap]
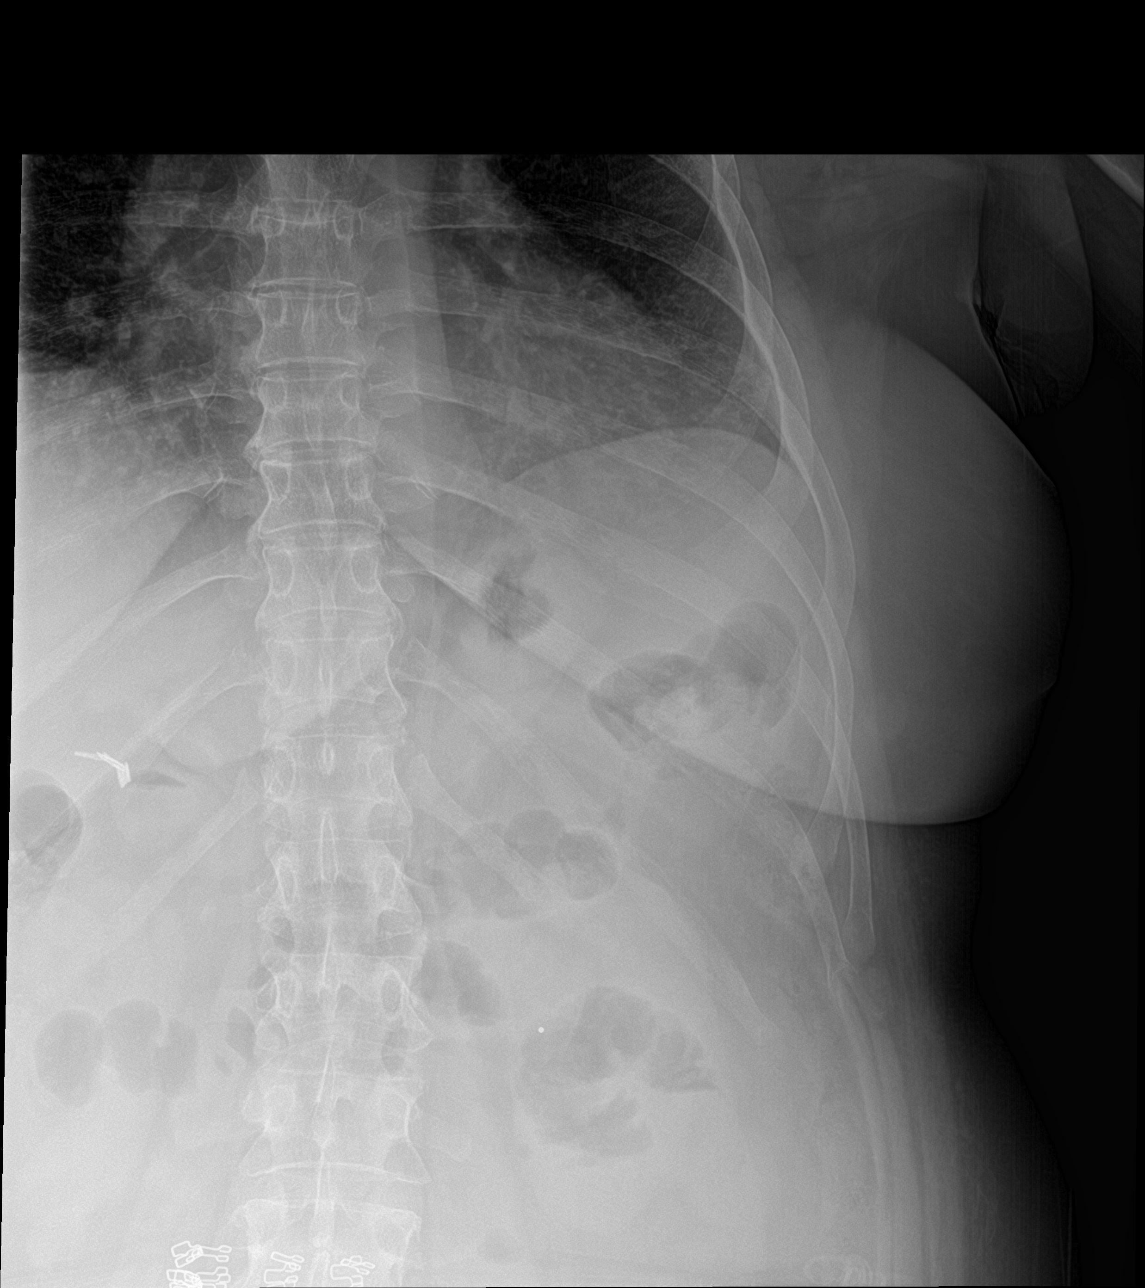

[rib ap obl (1 of 2)]
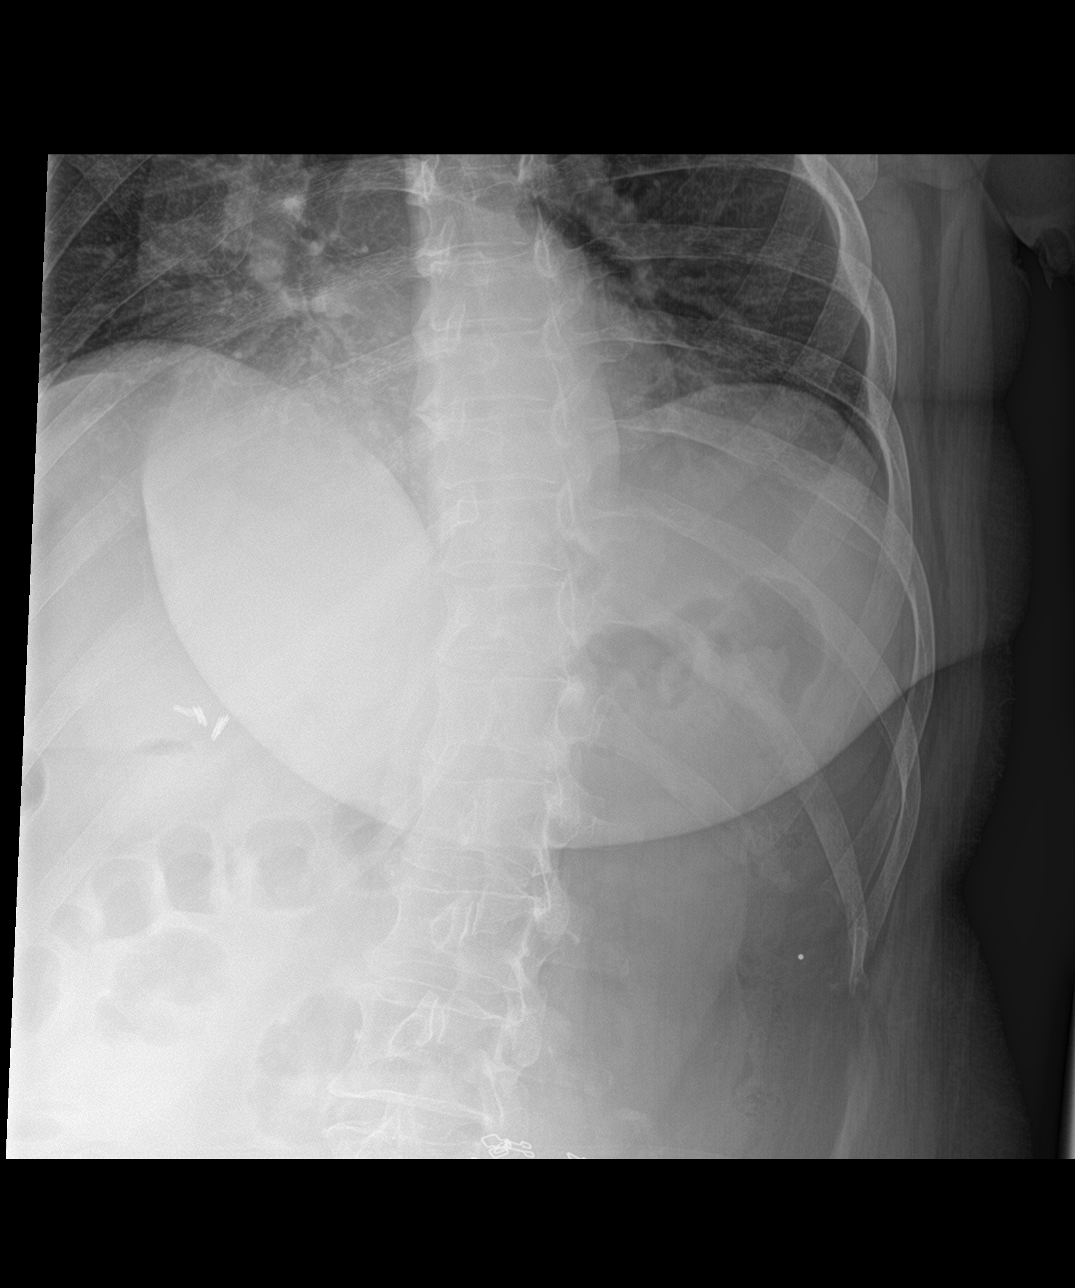

[rib ap obl (2 of 2)]
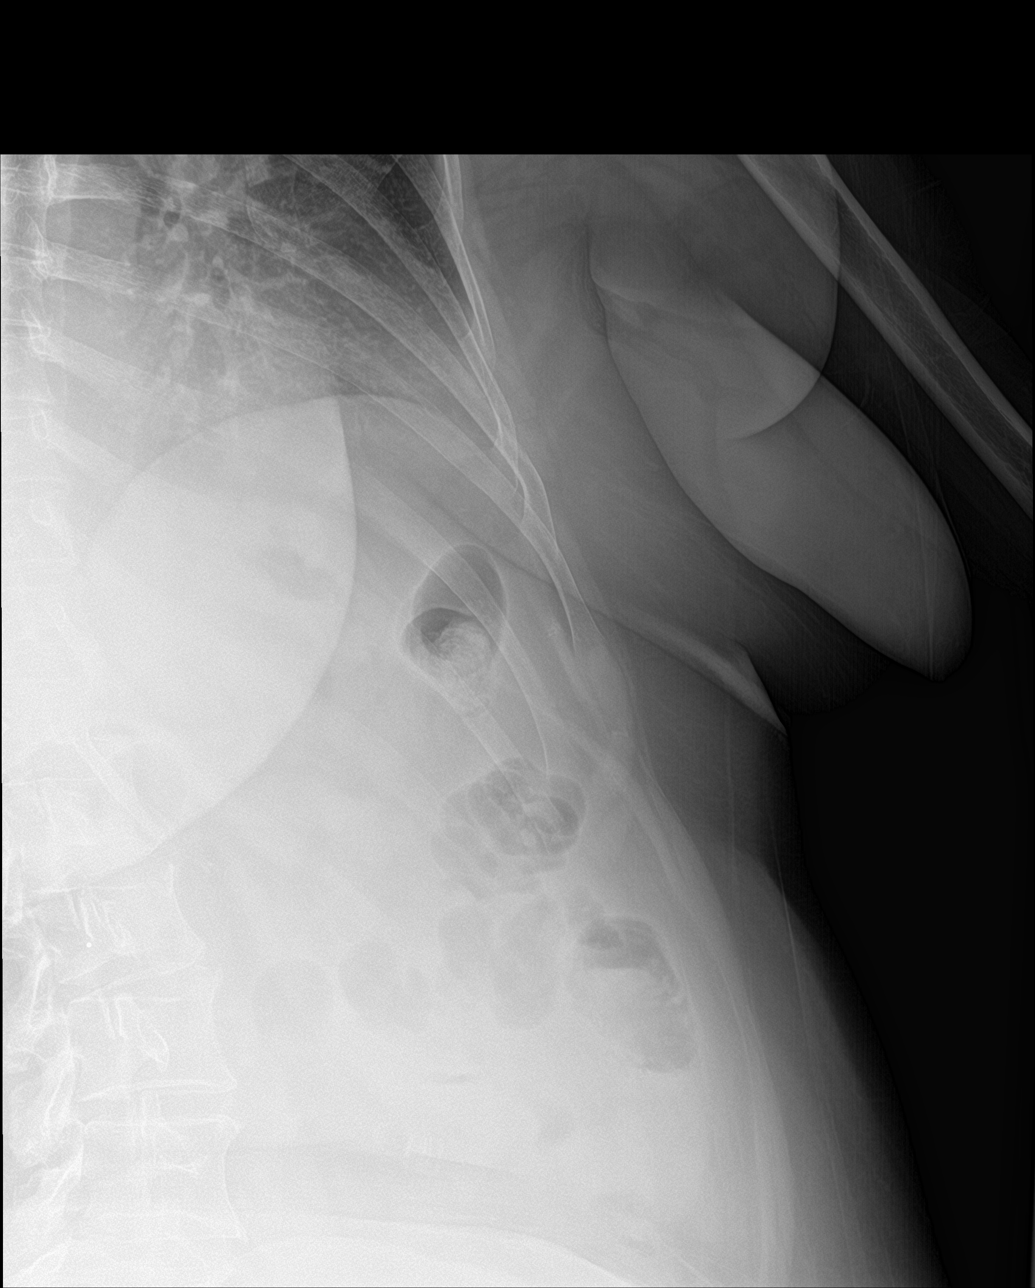

[4 of 4 positions shown; findings below may reference images not displayed]

FINDINGS: No fracture or other acute bone lesions are seen involving the ribs.
There is no evidence of pneumothorax or pleural effusion. Both lungs
are clear. Heart size and mediastinal contours are within normal
limits. Old right rib deformity consistent with remote fracture.
IMPRESSION: 1. Negative.
2. No acute rib fracture or bone destruction.

.

## 2020-07-19 ENCOUNTER — Encounter: Payer: Self-pay | Admitting: Family Medicine

## 2020-07-19 ENCOUNTER — Telehealth: Payer: Self-pay

## 2020-07-19 DIAGNOSIS — J988 Other specified respiratory disorders: Secondary | ICD-10-CM | POA: Diagnosis not present

## 2020-07-19 DIAGNOSIS — G8929 Other chronic pain: Secondary | ICD-10-CM | POA: Diagnosis not present

## 2020-07-19 DIAGNOSIS — L659 Nonscarring hair loss, unspecified: Secondary | ICD-10-CM

## 2020-07-19 DIAGNOSIS — D509 Iron deficiency anemia, unspecified: Secondary | ICD-10-CM

## 2020-07-19 DIAGNOSIS — J029 Acute pharyngitis, unspecified: Secondary | ICD-10-CM | POA: Diagnosis not present

## 2020-07-19 DIAGNOSIS — Z87891 Personal history of nicotine dependence: Secondary | ICD-10-CM | POA: Diagnosis not present

## 2020-07-19 DIAGNOSIS — M25562 Pain in left knee: Secondary | ICD-10-CM | POA: Diagnosis not present

## 2020-07-19 NOTE — Telephone Encounter (Signed)
Faxed

## 2020-07-19 NOTE — Telephone Encounter (Signed)
OK 

## 2020-07-19 NOTE — Assessment & Plan Note (Signed)
She is planning on getting back in with Dr. Berdine Addison for further evaluation.  We will see if we can do to get spinal tap scheduled at [facility] regional.

## 2020-07-19 NOTE — Telephone Encounter (Signed)
Please let her know that her lab results that are back are negative. [place]ops is still pending. I am not sure why she had them re-done. She had these originally done on 02/18/20 when I ordered them, but she had completed through [facility]. See media in [place].  Thank you

## 2020-07-19 NOTE — Assessment & Plan Note (Signed)
Now back on atenolol.  Blood pressure is elevated today we will need to monitor carefully.  Following with cardiology.

## 2020-07-19 NOTE — Telephone Encounter (Signed)
Patient called today wanting to know her lab results. I didn't see where you had determined if there were any issues or not. Please let me know so I can give her a call back with the results, she's very anxious because she already can see there are results in her chart. I explained to her as soon as you read the results you will let us know so we can inform her of the readings.

## 2020-07-20 DIAGNOSIS — Z880 Allergy status to penicillin: Secondary | ICD-10-CM | POA: Diagnosis not present

## 2020-07-20 DIAGNOSIS — D508 Other iron deficiency anemias: Secondary | ICD-10-CM | POA: Diagnosis not present

## 2020-07-20 DIAGNOSIS — Z882 Allergy status to sulfonamides status: Secondary | ICD-10-CM | POA: Diagnosis not present

## 2020-07-20 DIAGNOSIS — C519 Malignant neoplasm of vulva, unspecified: Secondary | ICD-10-CM | POA: Diagnosis not present

## 2020-07-20 DIAGNOSIS — Z888 Allergy status to other drugs, medicaments and biological substances status: Secondary | ICD-10-CM | POA: Diagnosis not present

## 2020-07-20 DIAGNOSIS — L659 Nonscarring hair loss, unspecified: Secondary | ICD-10-CM | POA: Diagnosis not present

## 2020-07-20 DIAGNOSIS — Z885 Allergy status to narcotic agent status: Secondary | ICD-10-CM | POA: Diagnosis not present

## 2020-07-20 DIAGNOSIS — Z881 Allergy status to other antibiotic agents status: Secondary | ICD-10-CM | POA: Diagnosis not present

## 2020-07-20 DIAGNOSIS — Z886 Allergy status to analgesic agent status: Secondary | ICD-10-CM | POA: Diagnosis not present

## 2020-07-20 LAB — ALLERGY PANEL 18, NUT MIX GROUP
Allergen Coconut IgE: <0.1 kU/L
F020-IgE Almond: <0.1 kU/L
F202-IgE Cashew Nut: <0.1 kU/L
Hazelnut (Filbert) IgE: <0.1 kU/L
Peanut IgE: <0.1 kU/L
Pecan Nut IgE: <0.1 kU/L
Sesame Seed IgE: <0.1 kU/L

## 2020-07-20 LAB — ALLERGENS, ZONE 2
Alternaria Alternata IgE: <0.1 kU/L
Amer Sycamore IgE Qn: <0.1 kU/L
Aspergillus Fumigatus IgE: <0.1 kU/L
Bahia Grass IgE: <0.1 kU/L
Bermuda Grass IgE: <0.1 kU/L
Cat Dander IgE: <0.1 kU/L
Cedar, Mountain IgE: <0.1 kU/L
Cladosporium Herbarum IgE: <0.1 kU/L
Cockroach, American IgE: <0.1 kU/L
Common Silver Birch IgE: <0.1 kU/L
D Farinae IgE: <0.1 kU/L
D Pteronyssinus IgE: <0.1 kU/L
Dog Dander IgE: <0.1 kU/L
Elm, American IgE: <0.1 kU/L
Hickory, White IgE: <0.1 kU/L
Johnson Grass IgE: <0.1 kU/L
Maple/Box Elder IgE: <0.1 kU/L
Mucor Racemosus IgE: <0.1 kU/L
Mugwort IgE Qn: <0.1 kU/L
Nettle IgE: <0.1 kU/L
Oak, White IgE: <0.1 kU/L
Penicillium Chrysogen IgE: <0.1 kU/L
Pigweed, Rough IgE: <0.1 kU/L
Plantain, English IgE: <0.1 kU/L
Ragweed, Short IgE: <0.1 kU/L
Sheep Sorrel IgE Qn: <0.1 kU/L
Stemphylium Herbarum IgE: <0.1 kU/L
Sweet gum IgE RAST Ql: <0.1 kU/L
Timothy Grass IgE: <0.1 kU/L
White Mulberry IgE: <0.1 kU/L

## 2020-07-20 LAB — ALLERGEN, CASEIN, F78: F078-IgE Casein: <0.1 kU/L

## 2020-07-20 LAB — ALLERGEN, STRAWBERRY, F44: Allergen Strawberry IgE: <0.1 kU/L

## 2020-07-20 LAB — ALLERGEN, WHEAT, F4: Wheat IgE: <0.1 kU/L

## 2020-07-20 LAB — ALLERGEN MILK: Milk IgE: <0.1 kU/L

## 2020-07-20 LAB — ALLERGEN SOYBEAN: Soybean IgE: <0.1 kU/L

## 2020-07-20 LAB — ALLERGEN, TOMATO F25: Allergen Tomato, IgE: <0.1 kU/L

## 2020-07-20 LAB — ALLERGEN, HOPS/FRUIT CONE, IGE: Hops: <0.1 kU/L

## 2020-07-20 LAB — ALLERGEN CHOCOLATE: Chocolate/Cacao IgE: <0.1 kU/L

## 2020-07-20 LAB — ALLERGEN,OAT,F7: Allergen Oat IgE: <0.1 kU/L

## 2020-07-20 LAB — ALLERGEN CARROT: Allergen Carrot IgE: <0.1 kU/L

## 2020-07-20 LAB — ALLERGEN, GARLIC, F47: Allergen Garlic IgE: <0.1 kU/L

## 2020-07-20 LAB — ALLERGEN, RYE, F5: Rye IgE: <0.1 kU/L

## 2020-07-20 LAB — ALLERGEN, GINGER, RF270: Allergen Ginger IgE: <0.1 kU/L

## 2020-07-20 LAB — ALLERGEN, NUTMEG, RF282: F282-IgE Nutmeg: <0.1 kU/L

## 2020-07-20 LAB — ALLERGEN, MUSHROOM, RF212: Mushroom IgE: <0.1 kU/L

## 2020-07-20 LAB — ALLERGEN WATERMELON: Allergen Watermelon IgE: <0.1 kU/L

## 2020-07-20 LAB — ALLERGEN BARLEY F6: Allergen Barley IgE: <0.1 kU/L

## 2020-07-20 LAB — ALLERGEN CANTALOUPE: Allergen Melon IgE: <0.1 kU/L

## 2020-07-20 LAB — ALLERGEN,GRN PEPPER,PAPRIKA,F218: Paprika IgE: <0.1 kU/L

## 2020-07-20 LAB — ALLERGEN, CINNAMON, RF220: Allergen Cinnamon IgE: <0.1 kU/L

## 2020-07-21 ENCOUNTER — Telehealth (INDEPENDENT_AMBULATORY_CARE_PROVIDER_SITE_OTHER): Payer: Medicare HMO | Admitting: Family Medicine

## 2020-07-21 VITALS — BP 132/91 | HR 86

## 2020-07-21 DIAGNOSIS — G4733 Obstructive sleep apnea (adult) (pediatric): Secondary | ICD-10-CM | POA: Diagnosis not present

## 2020-07-21 DIAGNOSIS — R0602 Shortness of breath: Secondary | ICD-10-CM | POA: Diagnosis not present

## 2020-07-21 DIAGNOSIS — R0789 Other chest pain: Secondary | ICD-10-CM | POA: Diagnosis not present

## 2020-07-21 DIAGNOSIS — R141 Gas pain: Secondary | ICD-10-CM | POA: Diagnosis not present

## 2020-07-21 DIAGNOSIS — M25561 Pain in right knee: Secondary | ICD-10-CM | POA: Diagnosis not present

## 2020-07-21 DIAGNOSIS — I10 Essential (primary) hypertension: Secondary | ICD-10-CM | POA: Diagnosis not present

## 2020-07-21 DIAGNOSIS — R2233 Localized swelling, mass and lump, upper limb, bilateral: Secondary | ICD-10-CM

## 2020-07-21 DIAGNOSIS — R059 Cough, unspecified: Secondary | ICD-10-CM

## 2020-07-21 DIAGNOSIS — L659 Nonscarring hair loss, unspecified: Secondary | ICD-10-CM | POA: Diagnosis not present

## 2020-07-21 DIAGNOSIS — Z886 Allergy status to analgesic agent status: Secondary | ICD-10-CM | POA: Diagnosis not present

## 2020-07-21 DIAGNOSIS — K219 Gastro-esophageal reflux disease without esophagitis: Secondary | ICD-10-CM | POA: Diagnosis not present

## 2020-07-21 DIAGNOSIS — J986 Disorders of diaphragm: Secondary | ICD-10-CM | POA: Diagnosis not present

## 2020-07-21 DIAGNOSIS — E119 Type 2 diabetes mellitus without complications: Secondary | ICD-10-CM | POA: Diagnosis not present

## 2020-07-21 DIAGNOSIS — R1011 Right upper quadrant pain: Secondary | ICD-10-CM | POA: Diagnosis not present

## 2020-07-21 DIAGNOSIS — M25562 Pain in left knee: Secondary | ICD-10-CM

## 2020-07-21 DIAGNOSIS — E785 Hyperlipidemia, unspecified: Secondary | ICD-10-CM | POA: Diagnosis not present

## 2020-07-21 DIAGNOSIS — R079 Chest pain, unspecified: Secondary | ICD-10-CM | POA: Diagnosis not present

## 2020-07-21 DIAGNOSIS — Z87891 Personal history of nicotine dependence: Secondary | ICD-10-CM | POA: Diagnosis not present

## 2020-07-21 NOTE — Progress Notes (Signed)
C/O L knee pain she has been doing a lot more over the past few days. She states that she also has some redness on the inside of her R knee. She questions if this is coming from being up on her feet so much the past few days. She had to get help getting up out of bed.    She reports that she takes the Atenolol 25 mg once daily.   She has not started taking the Finasteride yet.

## 2020-07-21 NOTE — Assessment & Plan Note (Signed)
She has started the 1% topical minoxidil and is tolerating it well so far she noticed that she been getting a little bit of jaw discomfort after application but I discussed with her that this is not a side effect of the drug.  I do feel reassured.  She has not started the oral finasteride yet.
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
## 2020-07-21 NOTE — Assessment & Plan Note (Signed)
Reports that she is not feeling as well on the atenolol.  But felt like the metoprolol was possibly causing some increase shortness of breath and says that actually seemed to be better after coming off of it.  She also had been experiencing a lot of thickness and what felt like swelling in her throat and feels like that got better after coming off the metoprolol.  Though I reassured her that I do not think the swelling was actually coming from the metoprolol she was not having a true anaphylactic reaction to the medication which is what would actually cause throat swelling.  I think this was incidental and she has had this sensation in the past previously even not on a beta-blocker.  Encouraged her to consider switching back to metoprolol but to discuss with cardiology and let them know she could even consider switching to carvedilol.

## 2020-07-22 ENCOUNTER — Ambulatory Visit (INDEPENDENT_AMBULATORY_CARE_PROVIDER_SITE_OTHER): Payer: Medicare HMO | Admitting: Nurse Practitioner

## 2020-07-22 ENCOUNTER — Other Ambulatory Visit: Payer: Self-pay

## 2020-07-22 ENCOUNTER — Ambulatory Visit (INDEPENDENT_AMBULATORY_CARE_PROVIDER_SITE_OTHER): Payer: Medicare HMO

## 2020-07-22 ENCOUNTER — Encounter: Payer: Self-pay | Admitting: Nurse Practitioner

## 2020-07-22 VITALS — BP 126/84 | HR 92 | Temp 98.0°F | Ht 63.0 in | Wt 215.1 lb

## 2020-07-22 DIAGNOSIS — J90 Pleural effusion, not elsewhere classified: Secondary | ICD-10-CM | POA: Diagnosis not present

## 2020-07-22 DIAGNOSIS — R112 Nausea with vomiting, unspecified: Secondary | ICD-10-CM | POA: Diagnosis not present

## 2020-07-22 DIAGNOSIS — R1011 Right upper quadrant pain: Secondary | ICD-10-CM

## 2020-07-22 DIAGNOSIS — J986 Disorders of diaphragm: Secondary | ICD-10-CM | POA: Diagnosis not present

## 2020-07-22 DIAGNOSIS — J9811 Atelectasis: Secondary | ICD-10-CM | POA: Diagnosis not present

## 2020-07-22 DIAGNOSIS — R141 Gas pain: Secondary | ICD-10-CM | POA: Diagnosis not present

## 2020-07-22 DIAGNOSIS — K7689 Other specified diseases of liver: Secondary | ICD-10-CM | POA: Diagnosis not present
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
## 2020-07-22 NOTE — Patient Instructions (Signed)
We will get an ultrasound today to look for any abnormalities in the area of the liver, gallbladder, and diaphragm.   The chest x-ray shows mild elevation of the right side of the diaphragm, which usually indicates decreased lung volumes. This would not explain your pain.   All of your labs were normal, so I do not want to get additional labs today since they were done yesterday and you were having symptoms at that time, as well.   My nurse will be in contact with you once I have had a chance to review the results of the test- this will likely be after we close for the day and so the results may not come through until tomorrow.

## 2020-07-23 ENCOUNTER — Telehealth: Payer: Self-pay | Admitting: Family Medicine

## 2020-07-23 DIAGNOSIS — R000 Tachycardia, unspecified: Secondary | ICD-10-CM | POA: Diagnosis not present

## 2020-07-23 NOTE — Telephone Encounter (Signed)
She does not want to see Dr.Schmidt so she is wondering since she fell yesterday and hurt her left knee again If she can go ahead and get a MRI ordered since she cant get in with Dr. Madilyn Fireman until the 08/09/20

## 2020-07-23 NOTE — Telephone Encounter (Signed)
Please call pt and let her know that unfortunately due to scheduling Dr. Madilyn Fireman cannot see her. Dr. Madilyn Fireman did suggest that she be seen by the Sports medicine doctor in [place] Dr. Angelia Mould [phone]

## 2020-07-23 NOTE — Telephone Encounter (Signed)
My chart message sent to patient about getting x-ray done for this.

## 2020-07-23 NOTE — Telephone Encounter (Signed)
No MRI, we can start with a repeat x-ray since she fell.

## 2020-07-23 NOTE — Telephone Encounter (Signed)
Pt called. She fell and hurt her knee (knee she's been having problems with). Dr. Madilyn Fireman has no appointments and pt does not want to see another provider.  She wanted a telephone note sent.

## 2020-07-24 DIAGNOSIS — Z87891 Personal history of nicotine dependence: Secondary | ICD-10-CM | POA: Diagnosis not present

## 2020-07-24 DIAGNOSIS — Z20822 Contact with and (suspected) exposure to covid-19: Secondary | ICD-10-CM | POA: Diagnosis not present

## 2020-07-24 DIAGNOSIS — R0602 Shortness of breath: Secondary | ICD-10-CM | POA: Diagnosis not present

## 2020-07-24 DIAGNOSIS — R000 Tachycardia, unspecified: Secondary | ICD-10-CM | POA: Diagnosis not present

## 2020-07-24 DIAGNOSIS — R058 Other specified cough: Secondary | ICD-10-CM | POA: Diagnosis not present

## 2020-07-24 DIAGNOSIS — R059 Cough, unspecified: Secondary | ICD-10-CM | POA: Diagnosis not present

## 2020-07-25 DIAGNOSIS — Z87891 Personal history of nicotine dependence: Secondary | ICD-10-CM | POA: Diagnosis not present

## 2020-07-25 DIAGNOSIS — R0602 Shortness of breath: Secondary | ICD-10-CM | POA: Diagnosis not present

## 2020-07-25 DIAGNOSIS — R0689 Other abnormalities of breathing: Secondary | ICD-10-CM | POA: Diagnosis not present

## 2020-07-25 DIAGNOSIS — R0789 Other chest pain: Secondary | ICD-10-CM | POA: Diagnosis not present

## 2020-07-25 DIAGNOSIS — R110 Nausea: Secondary | ICD-10-CM | POA: Diagnosis not present

## 2020-07-25 DIAGNOSIS — J069 Acute upper respiratory infection, unspecified: Secondary | ICD-10-CM | POA: Diagnosis not present

## 2020-07-25 DIAGNOSIS — Z8709 Personal history of other diseases of the respiratory system: Secondary | ICD-10-CM | POA: Diagnosis not present

## 2020-07-25 DIAGNOSIS — I10 Essential (primary) hypertension: Secondary | ICD-10-CM | POA: Diagnosis not present

## 2020-07-25 DIAGNOSIS — R9431 Abnormal electrocardiogram [ECG] [EKG]: Secondary | ICD-10-CM | POA: Diagnosis not present

## 2020-07-25 DIAGNOSIS — R059 Cough, unspecified: Secondary | ICD-10-CM | POA: Diagnosis not present

## 2020-07-25 DIAGNOSIS — J209 Acute bronchitis, unspecified: Secondary | ICD-10-CM | POA: Diagnosis not present

## 2020-07-25 DIAGNOSIS — R52 Pain, unspecified: Secondary | ICD-10-CM | POA: Diagnosis not present

## 2020-07-25 DIAGNOSIS — R1013 Epigastric pain: Secondary | ICD-10-CM | POA: Diagnosis not present

## 2020-07-25 DIAGNOSIS — Z20822 Contact with and (suspected) exposure to covid-19: Secondary | ICD-10-CM | POA: Diagnosis not present

## 2020-07-25 DIAGNOSIS — R000 Tachycardia, unspecified: Secondary | ICD-10-CM | POA: Diagnosis not present

## 2020-07-26 ENCOUNTER — Ambulatory Visit: Payer: Medicare HMO | Admitting: Family Medicine

## 2020-07-26 DIAGNOSIS — Z20822 Contact with and (suspected) exposure to covid-19: Secondary | ICD-10-CM | POA: Diagnosis not present

## 2020-07-26 DIAGNOSIS — R0789 Other chest pain: Secondary | ICD-10-CM | POA: Diagnosis not present

## 2020-07-26 DIAGNOSIS — R079 Chest pain, unspecified: Secondary | ICD-10-CM | POA: Diagnosis not present

## 2020-07-26 DIAGNOSIS — I10 Essential (primary) hypertension: Secondary | ICD-10-CM | POA: Diagnosis not present

## 2020-07-26 DIAGNOSIS — R9431 Abnormal electrocardiogram [ECG] [EKG]: Secondary | ICD-10-CM | POA: Diagnosis not present

## 2020-07-26 DIAGNOSIS — I517 Cardiomegaly: Secondary | ICD-10-CM | POA: Diagnosis not present

## 2020-07-26 DIAGNOSIS — E785 Hyperlipidemia, unspecified: Secondary | ICD-10-CM | POA: Diagnosis not present

## 2020-07-26 DIAGNOSIS — R000 Tachycardia, unspecified: Secondary | ICD-10-CM | POA: Diagnosis not present

## 2020-07-26 DIAGNOSIS — R5383 Other fatigue: Secondary | ICD-10-CM | POA: Diagnosis not present

## 2020-07-26 DIAGNOSIS — R002 Palpitations: Secondary | ICD-10-CM | POA: Diagnosis not present

## 2020-07-26 DIAGNOSIS — R059 Cough, unspecified: Secondary | ICD-10-CM | POA: Diagnosis not present

## 2020-07-26 DIAGNOSIS — E119 Type 2 diabetes mellitus without complications: Secondary | ICD-10-CM | POA: Diagnosis not present

## 2020-07-26 NOTE — Progress Notes (Deleted)
[place] [address] Dept: [phone]  FOLLOW UP NOTE  Patient ID: [name], female    DOB: November 18, 1965  Age: 54 y.o. MRN: [id number] Date of Office Visit: 07/26/2020  Assessment  Chief Complaint: No chief complaint on file.  HPI Jennifer [name]    Drug Allergies:  Allergies  Allergen Reactions  . Azithromycin Shortness Of Breath    Lip swelling, SOB.     . Ciprofloxacin Swelling    REACTION: tongue swells  . Codeine Shortness Of Breath  . Erythromycin Base Itching and Rash  . Milk-Related Compounds Swelling    Throat feels tight  . Mushroom Extract Complex Other (See Comments), Anaphylaxis and Rash    Didn't feel right Per allergist do not take  . Peanut Oil Anaphylaxis    Other reaction(s): Other (See Comments) Per allergist,do not take Other reaction(s): Other (See Comments) Per allergist,do not take Per allergist, do not take  . Sulfa Antibiotics Shortness Of Breath, Rash and Other (See Comments)  . Sulfasalazine Rash and Shortness Of Breath    Other reaction(s): Other (See Comments) Other reaction(s): SHORTNESS OF BREATH  . Telmisartan Swelling    Tongue swelling, Micardis  . Ace Inhibitors Cough  . Aspirin Hives and Other (See Comments)    flushing  . Avelox [Moxifloxacin Hcl In Nacl] Itching       . Beta Adrenergic Blockers Other (See Comments)    Feels like chest tightening labetalol, bystolic  Feels like chest tightening "Metoprolol"   . Buspar [Buspirone] Other (See Comments)    Light headed  . Butorphanol Tartrate Other (See Comments)    Patient aggitated  . Cetirizine Hives and Rash       . Clonidine Hcl     REACTION: makes blood pressure high  . Cortisone     Feels like she is going crazy  . Erythromycin Rash  . Fentanyl Other (See Comments)    aggressive   . Fluoxetine Hcl Other (See Comments)    REACTION: headaches  . Ketorolac Tromethamine     jittery  . Lidocaine Other (See Comments)    When it  involves the throat,   . Lisinopril Cough  . Metoclopramide Hcl Other (See Comments)    Dystonic reaction  . Midazolam Other (See Comments)    agitation Slow to wake up  . Montelukast Other (See Comments)    Singulair  . Montelukast Sodium Other (See Comments)    DOES NOT REMEMBER  Don't remember-told not to take  . Naproxen Other (See Comments)    FLUSHING Pt states she took Ibuprofen today (10/08/19)  . Paroxetine Other (See Comments)    REACTION: headaches  . Penicillins Rash  . Pravastatin Other (See Comments)    Myalgias  . Promethazine Other (See Comments)    Dystonic reaction  . Promethazine Hcl Other (See Comments)    jittery  . Quinolones Swelling and Rash  . Serotonin Reuptake Inhibitors (Ssris) Other (See Comments)    Headache Effexor, prozac, zoloft,   . Sertraline Hcl     REACTION: headaches  . Stelazine [Trifluoperazine] Other (See Comments)    Dystonic reaction  . Tobramycin Itching and Rash  . Trifluoperazine Hcl     dystonic  . Whey     Milk allergy  . Atrovent Nasal Spray [Ipratropium]     Tachycardia and shaking  . Polyethylene Glycol 3350     Other reaction(s): Laryngeal Edema (ALLERGY)  . Propoxyphene   . Adhesive [  Tape] Rash    EKG monitor patches, some tapes Blisters, rash, itching, welts.  . Butorphanol Anxiety    Patient agitated  . Ceftriaxone Rash    rocephin  . Iron Rash    Flushing with certain IV types  . Metoclopramide Itching and Other (See Comments)    Dystonic reaction  . Metronidazole Rash  . Other Rash and Other (See Comments)    Uncoded Allergy. Allergen: steriods, Other Reaction: Not Assessed Other reaction(s): Flushing (ALLERGY/intolerance), GI Upset (intolerance), Hypertension (intolerance), Increased Heart Rate (intolerance), Mental Status Changes (intolerance), Other (See Comments), Tachycardia / Palpitations(intolerance) Hospital gowns leave a rash.   . Prednisone Anxiety and Palpitations  . Prochlorperazine  Anxiety    Compazine:  Dystonic reaction  . Venlafaxine Anxiety  . Zyrtec [Cetirizine Hcl] Rash    All over body    Physical Exam: LMP 06/25/2013    Physical Exam  Diagnostics:    Assessment and Plan: No diagnosis found.  No orders of the defined types were placed in this encounter.   There are no Patient Instructions on file for this visit.  No follow-ups on file.    Thank you for the opportunity to care for this patient.  Please do not hesitate to contact me with questions.  Gareth Morgan, FNP [facility]

## 2020-07-27 DIAGNOSIS — E119 Type 2 diabetes mellitus without complications: Secondary | ICD-10-CM | POA: Diagnosis not present

## 2020-07-27 DIAGNOSIS — R002 Palpitations: Secondary | ICD-10-CM | POA: Diagnosis not present

## 2020-07-27 DIAGNOSIS — R079 Chest pain, unspecified: Secondary | ICD-10-CM | POA: Diagnosis not present

## 2020-07-27 DIAGNOSIS — R0789 Other chest pain: Secondary | ICD-10-CM | POA: Diagnosis not present

## 2020-07-27 DIAGNOSIS — R062 Wheezing: Secondary | ICD-10-CM | POA: Diagnosis not present

## 2020-07-27 DIAGNOSIS — I10 Essential (primary) hypertension: Secondary | ICD-10-CM | POA: Diagnosis not present

## 2020-07-27 DIAGNOSIS — Z87891 Personal history of nicotine dependence: Secondary | ICD-10-CM | POA: Diagnosis not present

## 2020-07-27 DIAGNOSIS — R9431 Abnormal electrocardiogram [ECG] [EKG]: Secondary | ICD-10-CM | POA: Diagnosis not present

## 2020-07-27 DIAGNOSIS — J45901 Unspecified asthma with (acute) exacerbation: Secondary | ICD-10-CM | POA: Diagnosis not present

## 2020-07-27 DIAGNOSIS — Z20822 Contact with and (suspected) exposure to covid-19: Secondary | ICD-10-CM | POA: Diagnosis not present

## 2020-07-27 DIAGNOSIS — R5383 Other fatigue: Secondary | ICD-10-CM | POA: Diagnosis not present

## 2020-07-27 DIAGNOSIS — J208 Acute bronchitis due to other specified organisms: Secondary | ICD-10-CM | POA: Diagnosis not present

## 2020-07-27 DIAGNOSIS — D509 Iron deficiency anemia, unspecified: Secondary | ICD-10-CM | POA: Diagnosis not present

## 2020-07-27 DIAGNOSIS — R059 Cough, unspecified: Secondary | ICD-10-CM | POA: Diagnosis not present

## 2020-07-27 DIAGNOSIS — D72819 Decreased white blood cell count, unspecified: Secondary | ICD-10-CM | POA: Diagnosis not present

## 2020-07-27 NOTE — Telephone Encounter (Signed)
Thank you :)

## 2020-07-28 DIAGNOSIS — R079 Chest pain, unspecified: Secondary | ICD-10-CM | POA: Diagnosis not present

## 2020-07-28 DIAGNOSIS — J45901 Unspecified asthma with (acute) exacerbation: Secondary | ICD-10-CM | POA: Diagnosis not present

## 2020-07-28 DIAGNOSIS — E611 Iron deficiency: Secondary | ICD-10-CM | POA: Diagnosis not present

## 2020-07-28 DIAGNOSIS — I10 Essential (primary) hypertension: Secondary | ICD-10-CM | POA: Diagnosis not present

## 2020-07-28 DIAGNOSIS — E119 Type 2 diabetes mellitus without complications: Secondary | ICD-10-CM | POA: Diagnosis not present

## 2020-07-28 DIAGNOSIS — R9431 Abnormal electrocardiogram [ECG] [EKG]: Secondary | ICD-10-CM | POA: Diagnosis not present

## 2020-07-29 DIAGNOSIS — E1169 Type 2 diabetes mellitus with other specified complication: Secondary | ICD-10-CM | POA: Diagnosis not present

## 2020-07-29 DIAGNOSIS — J45901 Unspecified asthma with (acute) exacerbation: Secondary | ICD-10-CM | POA: Diagnosis not present

## 2020-07-29 DIAGNOSIS — R079 Chest pain, unspecified: Secondary | ICD-10-CM | POA: Diagnosis not present

## 2020-07-29 DIAGNOSIS — R9431 Abnormal electrocardiogram [ECG] [EKG]: Secondary | ICD-10-CM | POA: Diagnosis not present

## 2020-07-29 DIAGNOSIS — B9781 Human metapneumovirus as the cause of diseases classified elsewhere: Secondary | ICD-10-CM | POA: Diagnosis not present

## 2020-07-29 DIAGNOSIS — E785 Hyperlipidemia, unspecified: Secondary | ICD-10-CM | POA: Diagnosis not present

## 2020-07-29 DIAGNOSIS — I10 Essential (primary) hypertension: Secondary | ICD-10-CM | POA: Diagnosis not present

## 2020-07-29 DIAGNOSIS — J4551 Severe persistent asthma with (acute) exacerbation: Secondary | ICD-10-CM | POA: Diagnosis not present

## 2020-07-29 DIAGNOSIS — Z8542 Personal history of malignant neoplasm of other parts of uterus: Secondary | ICD-10-CM | POA: Diagnosis not present

## 2020-07-29 DIAGNOSIS — F410 Panic disorder [episodic paroxysmal anxiety] without agoraphobia: Secondary | ICD-10-CM | POA: Diagnosis not present

## 2020-07-29 DIAGNOSIS — R0602 Shortness of breath: Secondary | ICD-10-CM | POA: Diagnosis not present

## 2020-07-29 DIAGNOSIS — G35 Multiple sclerosis: Secondary | ICD-10-CM | POA: Diagnosis not present

## 2020-07-30 ENCOUNTER — Other Ambulatory Visit: Payer: Self-pay

## 2020-07-30 ENCOUNTER — Ambulatory Visit: Payer: Medicare HMO | Admitting: Family Medicine

## 2020-07-30 ENCOUNTER — Observation Stay (HOSPITAL_BASED_OUTPATIENT_CLINIC_OR_DEPARTMENT_OTHER)
Admission: EM | Admit: 2020-07-30 | Discharge: 2020-07-31 | Disposition: A | Discharge status: Home / Self Care | Payer: Medicare HMO | Attending: Internal Medicine | Admitting: Internal Medicine

## 2020-07-30 ENCOUNTER — Encounter: Payer: Self-pay | Admitting: *Deleted

## 2020-07-30 ENCOUNTER — Telehealth: Payer: Self-pay | Admitting: Family Medicine

## 2020-07-30 ENCOUNTER — Encounter (HOSPITAL_BASED_OUTPATIENT_CLINIC_OR_DEPARTMENT_OTHER): Payer: Self-pay | Admitting: *Deleted

## 2020-07-30 ENCOUNTER — Emergency Department (HOSPITAL_BASED_OUTPATIENT_CLINIC_OR_DEPARTMENT_OTHER): Payer: Medicare HMO

## 2020-07-30 ENCOUNTER — Telehealth: Payer: Self-pay | Admitting: *Deleted

## 2020-07-30 DIAGNOSIS — R9431 Abnormal electrocardiogram [ECG] [EKG]: Secondary | ICD-10-CM | POA: Diagnosis not present

## 2020-07-30 DIAGNOSIS — Z8616 Personal history of COVID-19: Secondary | ICD-10-CM | POA: Diagnosis not present

## 2020-07-30 DIAGNOSIS — Z91010 Allergy to peanuts: Secondary | ICD-10-CM | POA: Insufficient documentation

## 2020-07-30 DIAGNOSIS — I10 Essential (primary) hypertension: Secondary | ICD-10-CM | POA: Insufficient documentation

## 2020-07-30 DIAGNOSIS — J45901 Unspecified asthma with (acute) exacerbation: Secondary | ICD-10-CM | POA: Diagnosis present

## 2020-07-30 DIAGNOSIS — Z87891 Personal history of nicotine dependence: Secondary | ICD-10-CM | POA: Diagnosis not present

## 2020-07-30 DIAGNOSIS — R059 Cough, unspecified: Secondary | ICD-10-CM | POA: Diagnosis not present

## 2020-07-30 DIAGNOSIS — R0602 Shortness of breath: Secondary | ICD-10-CM

## 2020-07-30 DIAGNOSIS — J4541 Moderate persistent asthma with (acute) exacerbation: Secondary | ICD-10-CM | POA: Insufficient documentation

## 2020-07-30 DIAGNOSIS — Z9861 Coronary angioplasty status: Secondary | ICD-10-CM | POA: Insufficient documentation

## 2020-07-30 DIAGNOSIS — Z79899 Other long term (current) drug therapy: Secondary | ICD-10-CM | POA: Insufficient documentation

## 2020-07-30 DIAGNOSIS — F418 Other specified anxiety disorders: Secondary | ICD-10-CM | POA: Diagnosis present

## 2020-07-30 DIAGNOSIS — E876 Hypokalemia: Secondary | ICD-10-CM | POA: Diagnosis not present

## 2020-07-30 DIAGNOSIS — U071 COVID-19: Principal | ICD-10-CM | POA: Diagnosis present

## 2020-07-30 DIAGNOSIS — R000 Tachycardia, unspecified: Secondary | ICD-10-CM | POA: Insufficient documentation

## 2020-07-30 DIAGNOSIS — R062 Wheezing: Secondary | ICD-10-CM | POA: Diagnosis not present

## 2020-07-30 LAB — CBC WITH DIFFERENTIAL/PLATELET
Abs Immature Granulocytes: 0.02 10*3/uL (ref 0.00–0.07)
Basophils Absolute: 0 10*3/uL (ref 0.0–0.1)
Basophils Relative: 0 %
Eosinophils Absolute: 0.1 10*3/uL (ref 0.0–0.5)
Eosinophils Relative: 2 %
HCT: 40.3 % (ref 36.0–46.0)
Hemoglobin: 13.1 g/dL (ref 12.0–15.0)
Immature Granulocytes: 1 %
Lymphocytes Relative: 41 %
Lymphs Abs: 1.7 10*3/uL (ref 0.7–4.0)
MCH: 28.2 pg (ref 26.0–34.0)
MCHC: 32.5 g/dL (ref 30.0–36.0)
MCV: 86.9 fL (ref 80.0–100.0)
Monocytes Absolute: 0.5 10*3/uL (ref 0.1–1.0)
Monocytes Relative: 11 %
Neutro Abs: 1.9 10*3/uL (ref 1.7–7.7)
Neutrophils Relative %: 45 %
Platelets: 187 10*3/uL (ref 150–400)
RBC: 4.64 MIL/uL (ref 3.87–5.11)
RDW: 13.7 % (ref 11.5–15.5)
WBC: 4.1 10*3/uL (ref 4.0–10.5)
nRBC: 0 % (ref 0.0–0.2)

## 2020-07-30 LAB — HEPATIC FUNCTION PANEL
ALT: 22 U/L (ref 0–44)
AST: 19 U/L (ref 15–41)
Albumin: 3.9 g/dL (ref 3.5–5.0)
Alkaline Phosphatase: 58 U/L (ref 38–126)
Bilirubin, Direct: 0.1 mg/dL (ref 0.0–0.2)
Indirect Bilirubin: 0.4 mg/dL (ref 0.3–0.9)
Total Bilirubin: 0.5 mg/dL (ref 0.3–1.2)
Total Protein: 6.9 g/dL (ref 6.5–8.1)

## 2020-07-30 LAB — BASIC METABOLIC PANEL
Anion gap: 12 (ref 5–15)
BUN: 18 mg/dL (ref 6–20)
CO2: 22 mmol/L (ref 22–32)
Calcium: 8.7 mg/dL — ABNORMAL LOW (ref 8.9–10.3)
Chloride: 109 mmol/L (ref 98–111)
Creatinine, Ser: 0.76 mg/dL (ref 0.44–1.00)
GFR, Estimated: >60 mL/min (ref >60)
Glucose, Bld: 149 mg/dL — ABNORMAL HIGH (ref 70–99)
Potassium: 3.4 mmol/L — ABNORMAL LOW (ref 3.5–5.1)
Sodium: 143 mmol/L (ref 135–145)

## 2020-07-30 LAB — LACTATE DEHYDROGENASE: LDH: 191 U/L (ref 98–192)

## 2020-07-30 LAB — D-DIMER, QUANTITATIVE: D-Dimer, Quant: 0.55 ug/mL-FEU — ABNORMAL HIGH (ref 0.00–0.50)

## 2020-07-30 LAB — TRIGLYCERIDES: Triglycerides: 165 mg/dL — ABNORMAL HIGH (ref <150)

## 2020-07-30 LAB — LACTIC ACID, PLASMA
Lactic Acid, Venous: 2 mmol/L (ref 0.5–1.9)
Lactic Acid, Venous: 1.1 mmol/L (ref 0.5–1.9)

## 2020-07-30 LAB — PROCALCITONIN: Procalcitonin: <0.1 ng/mL

## 2020-07-30 LAB — FERRITIN: Ferritin: 97 ng/mL (ref 11–307)

## 2020-07-30 LAB — C-REACTIVE PROTEIN: CRP: 0.8 mg/dL (ref <1.0)

## 2020-07-30 LAB — RESP PANEL BY RT-PCR (FLU A&B, COVID) ARPGX2
Influenza A by PCR: NEGATIVE
Influenza B by PCR: NEGATIVE
SARS Coronavirus 2 by RT PCR: POSITIVE — AB

## 2020-07-30 LAB — FIBRINOGEN: Fibrinogen: 425 mg/dL (ref 210–475)

## 2020-07-30 IMAGING — DX DG ABDOMEN ACUTE W/ 1V CHEST
4 series · 4 of 4 positions shown · non-contrast
Comparison: 01/17/2019

CLINICAL DATA: Abdominal pain

EXAM:
DG ABDOMEN ACUTE W/ 1V CHEST

[abdomen kub (1 of 2)]
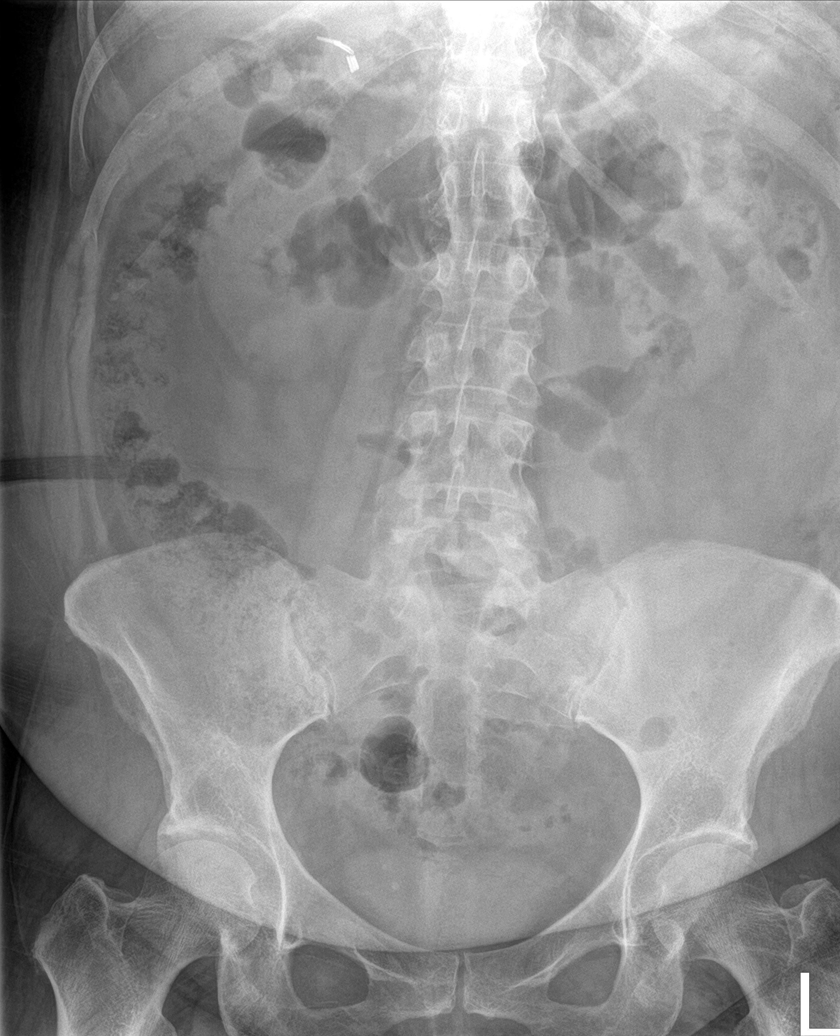

[abdomen kub (2 of 2)]
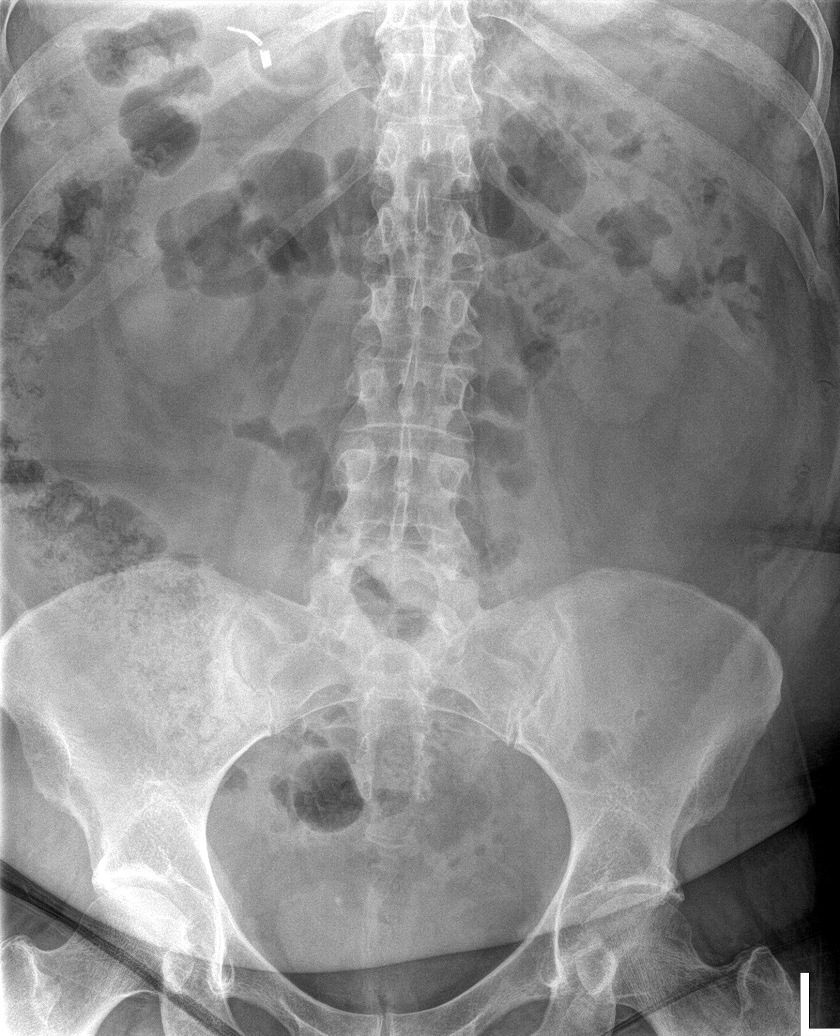

[abdomen erect]
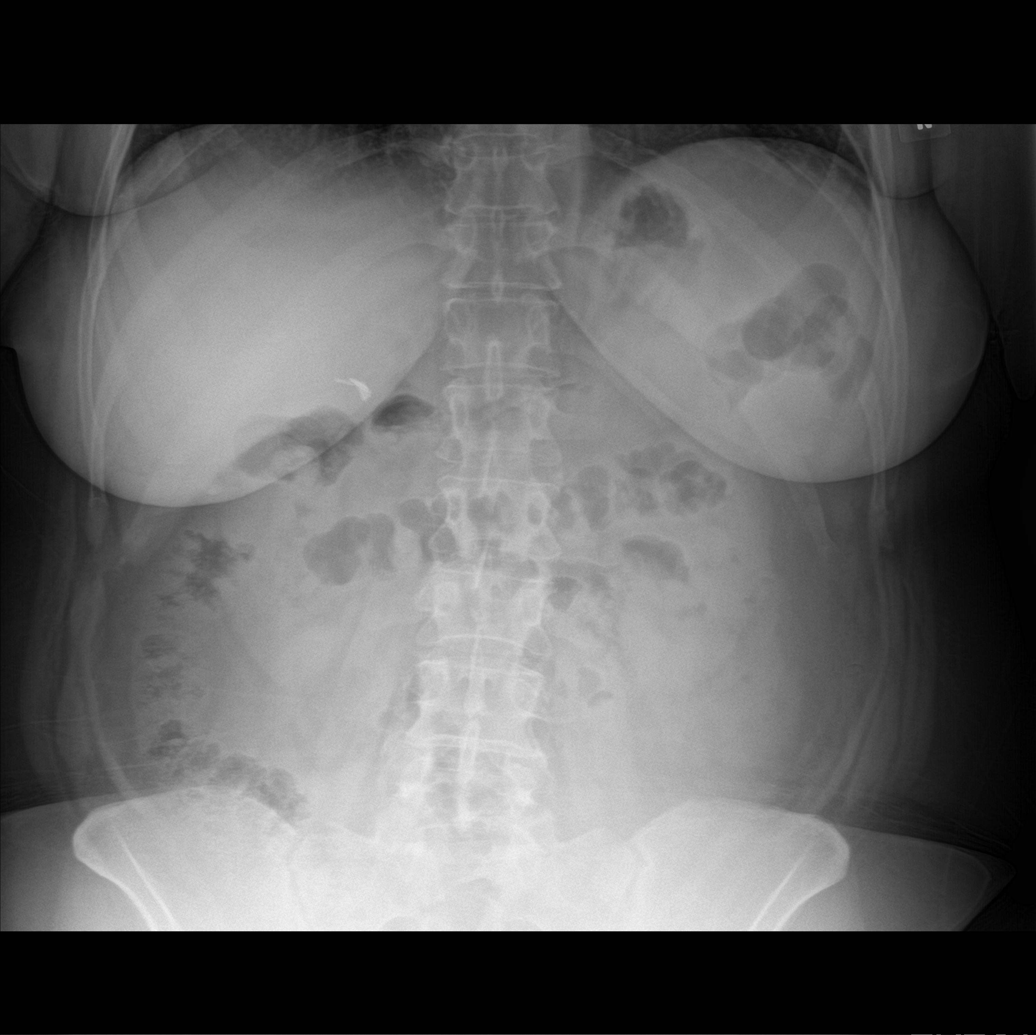

[chest pa]
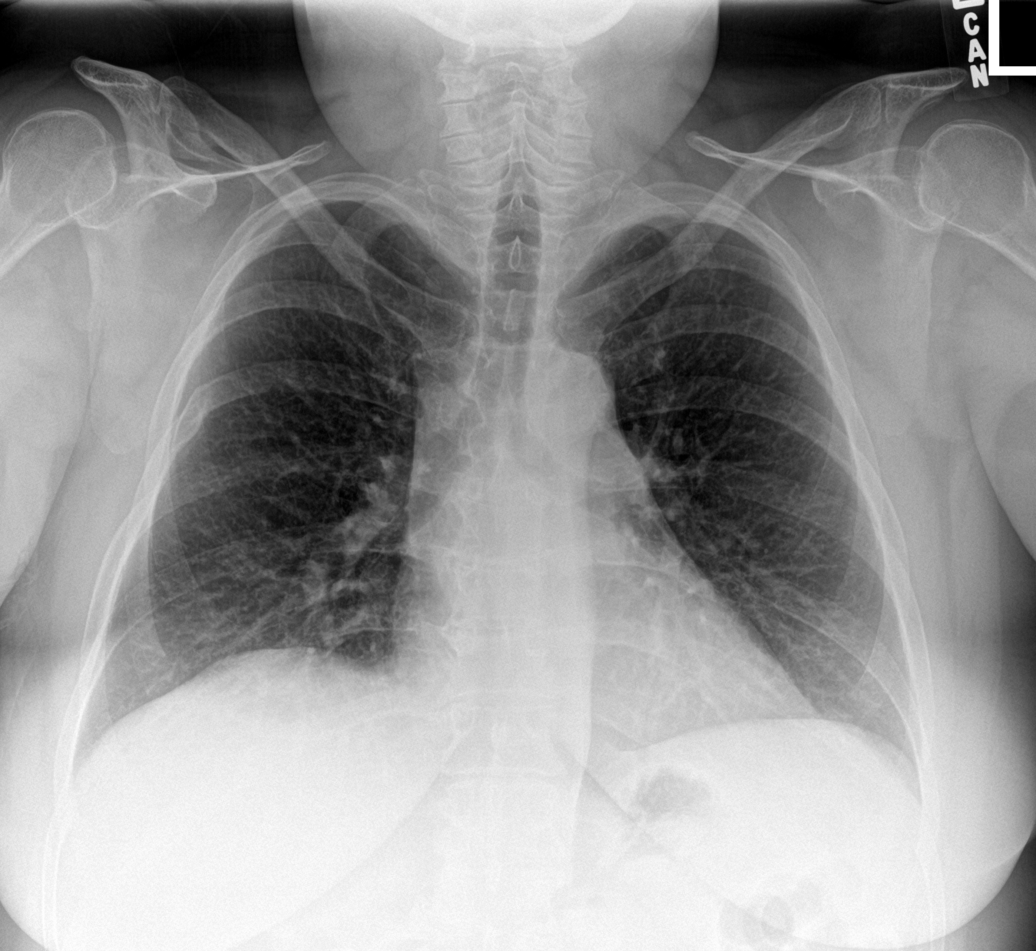

[4 of 4 positions shown; findings below may reference images not displayed]

FINDINGS: Prior cholecystectomy. The bowel gas pattern is normal. There is no
evidence of free intraperitoneal air. No suspicious radio-opaque
calculi or other significant radiographic abnormality is seen. Heart
size and mediastinal contours are within normal limits. Both lungs
are clear.
IMPRESSION: Cholecystectomy.  No acute findings.

## 2020-07-30 MED ORDER — LEVALBUTEROL HCL 1.25 MG/0.5ML IN NEBU
1.2500 mg | INHALATION_SOLUTION | Freq: Four times a day (QID) | RESPIRATORY_TRACT | Status: DC
  Administered 2020-07-30: 1.25 mg via RESPIRATORY_TRACT
  Filled 2020-07-30: qty 0.5

## 2020-07-30 MED ORDER — METHYLPREDNISOLONE SODIUM SUCC 40 MG IJ SOLR
40.0000 mg | Freq: Two times a day (BID) | INTRAMUSCULAR | Status: DC
  Administered 2020-07-30 – 2020-07-31 (×2): 40 mg via INTRAVENOUS
  Filled 2020-07-30 (×2): qty 1

## 2020-07-30 MED ORDER — SODIUM CHLORIDE 0.9 % IV BOLUS
1000.0000 mL | Freq: Once | INTRAVENOUS | Status: AC
  Administered 2020-07-30: 1000 mL via INTRAVENOUS

## 2020-07-30 MED ORDER — LEVALBUTEROL HCL 1.25 MG/0.5ML IN NEBU
1.2500 mg | INHALATION_SOLUTION | Freq: Once | RESPIRATORY_TRACT | Status: AC
  Administered 2020-07-30: 1.25 mg via RESPIRATORY_TRACT
  Filled 2020-07-30: qty 0.5

## 2020-07-30 MED ORDER — POTASSIUM CHLORIDE 20 MEQ PO PACK
20.0000 meq | PACK | Freq: Once | ORAL | Status: AC
  Administered 2020-07-30: 20 meq via ORAL
  Filled 2020-07-30: qty 1

## 2020-07-30 MED ORDER — ACETAMINOPHEN 325 MG PO TABS
650.0000 mg | ORAL_TABLET | Freq: Four times a day (QID) | ORAL | Status: DC | PRN
  Filled 2020-07-30: qty 2

## 2020-07-30 MED ORDER — LEVALBUTEROL HCL 1.25 MG/0.5ML IN NEBU
1.2500 mg | INHALATION_SOLUTION | Freq: Four times a day (QID) | RESPIRATORY_TRACT | Status: DC | PRN
  Administered 2020-07-31: 1.25 mg via RESPIRATORY_TRACT
  Filled 2020-07-30 (×3): qty 0.5

## 2020-07-30 MED ORDER — ALBUTEROL SULFATE HFA 108 (90 BASE) MCG/ACT IN AERS: 2.0000 | INHALATION_SPRAY | Freq: Four times a day (QID) | RESPIRATORY_TRACT | Status: DC

## 2020-07-30 MED ORDER — METHYLPREDNISOLONE SODIUM SUCC 125 MG IJ SOLR
125.0000 mg | Freq: Once | INTRAMUSCULAR | Status: AC
  Administered 2020-07-30: 125 mg via INTRAVENOUS
  Filled 2020-07-30: qty 2

## 2020-07-30 MED ORDER — ONDANSETRON HCL 4 MG PO TABS: 4.0000 mg | ORAL_TABLET | Freq: Four times a day (QID) | ORAL | Status: DC | PRN

## 2020-07-30 MED ORDER — MAGNESIUM SULFATE 2 GM/50ML IV SOLN
2.0000 g | Freq: Once | INTRAVENOUS | Status: AC
  Administered 2020-07-30: 2 g via INTRAVENOUS

## 2020-07-30 MED ORDER — ALBUTEROL SULFATE HFA 108 (90 BASE) MCG/ACT IN AERS
2.0000 | INHALATION_SPRAY | RESPIRATORY_TRACT | Status: DC
  Administered 2020-07-30: 2 via RESPIRATORY_TRACT
  Filled 2020-07-30 (×2): qty 6.7

## 2020-07-30 MED ORDER — ALBUTEROL SULFATE HFA 108 (90 BASE) MCG/ACT IN AERS: 2.0000 | INHALATION_SPRAY | RESPIRATORY_TRACT | Status: DC | PRN

## 2020-07-30 MED ORDER — PREDNISONE 20 MG PO TABS: 50.0000 mg | ORAL_TABLET | Freq: Every day | ORAL | Status: DC

## 2020-07-30 MED ORDER — ENOXAPARIN SODIUM 60 MG/0.6ML ~~LOC~~ SOLN
50.0000 mg | SUBCUTANEOUS | Status: DC
  Administered 2020-07-30: 50 mg via SUBCUTANEOUS
  Filled 2020-07-30: qty 0.6

## 2020-07-30 MED ORDER — METHYLPREDNISOLONE SODIUM SUCC 125 MG IJ SOLR: 1.0000 mg/kg | Freq: Two times a day (BID) | INTRAMUSCULAR | Status: DC

## 2020-07-30 MED ORDER — MAGNESIUM SULFATE 2 GM/50ML IV SOLN
2.0000 g | Freq: Once | INTRAVENOUS | Status: DC
  Filled 2020-07-30: qty 50

## 2020-07-30 MED ORDER — ONDANSETRON HCL 4 MG/2ML IJ SOLN: 4.0000 mg | Freq: Four times a day (QID) | INTRAMUSCULAR | Status: DC | PRN
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
## 2020-07-30 NOTE — Telephone Encounter (Signed)
Pt called and stated that she has tested positive for COVID again. She is currently at [facility] [place]. She stated that she had been c/o wheezing and her O2 .  ° °She stated that she is afraid of her wishes not being met. She asked that the MOST form be corrected and would like for this to reflect that she would like for it to say that she would like the FULL SCOPE OF TREATMENT EFFECTIVE 07/30/2020.  °

## 2020-07-30 NOTE — Progress Notes (Signed)
Patient arrive at 5W at 6:45pm via [place] . Patient wants it known that she wants to be FULL CODE. Patient stated she wants to be retested for COVID 19.

## 2020-07-30 NOTE — ED Triage Notes (Signed)
C/o short of breath, cough and wheezing x 1 week  Was seen yesterday for same

## 2020-07-30 NOTE — ED Notes (Signed)
Pt placed on cardiac monitor and EKG completed
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
## 2020-07-30 NOTE — Telephone Encounter (Signed)
Pt. Cancelled appt for this morning because she is currently in ED. Pt request an appt for next week for follow up. The next available appt is 12/30. Pt states she needs a sooner appt than that and request a call back.

## 2020-07-30 NOTE — ED Notes (Signed)
Pt ambulated around the room on room air, lowest sat 94%, highest 98 %

## 2020-07-30 NOTE — ED Notes (Signed)
PT STATES SHE IS A FULL CODE

## 2020-07-31 ENCOUNTER — Observation Stay (HOSPITAL_COMMUNITY): Payer: Medicare HMO

## 2020-07-31 DIAGNOSIS — U071 COVID-19: Secondary | ICD-10-CM | POA: Diagnosis not present

## 2020-07-31 DIAGNOSIS — J4541 Moderate persistent asthma with (acute) exacerbation: Secondary | ICD-10-CM | POA: Diagnosis not present

## 2020-07-31 DIAGNOSIS — F418 Other specified anxiety disorders: Secondary | ICD-10-CM | POA: Diagnosis not present

## 2020-07-31 DIAGNOSIS — R0602 Shortness of breath: Secondary | ICD-10-CM | POA: Diagnosis not present

## 2020-07-31 LAB — COMPREHENSIVE METABOLIC PANEL
ALT: 25 U/L (ref 0–44)
AST: 18 U/L (ref 15–41)
Albumin: 3.7 g/dL (ref 3.5–5.0)
Alkaline Phosphatase: 55 U/L (ref 38–126)
Anion gap: 13 (ref 5–15)
BUN: 15 mg/dL (ref 6–20)
CO2: 22 mmol/L (ref 22–32)
Calcium: 8.9 mg/dL (ref 8.9–10.3)
Chloride: 106 mmol/L (ref 98–111)
Creatinine, Ser: 0.52 mg/dL (ref 0.44–1.00)
GFR, Estimated: >60 mL/min (ref >60)
Glucose, Bld: 165 mg/dL — ABNORMAL HIGH (ref 70–99)
Potassium: 3.8 mmol/L (ref 3.5–5.1)
Sodium: 141 mmol/L (ref 135–145)
Total Bilirubin: 0.3 mg/dL (ref 0.3–1.2)
Total Protein: 6.7 g/dL (ref 6.5–8.1)

## 2020-07-31 LAB — CBC WITH DIFFERENTIAL/PLATELET
Abs Immature Granulocytes: 0.06 10*3/uL (ref 0.00–0.07)
Basophils Absolute: 0 10*3/uL (ref 0.0–0.1)
Basophils Relative: 0 %
Eosinophils Absolute: 0 10*3/uL (ref 0.0–0.5)
Eosinophils Relative: 0 %
HCT: 39.8 % (ref 36.0–46.0)
Hemoglobin: 12.6 g/dL (ref 12.0–15.0)
Immature Granulocytes: 1 %
Lymphocytes Relative: 17 %
Lymphs Abs: 1 10*3/uL (ref 0.7–4.0)
MCH: 27.9 pg (ref 26.0–34.0)
MCHC: 31.7 g/dL (ref 30.0–36.0)
MCV: 88.2 fL (ref 80.0–100.0)
Monocytes Absolute: 0.3 10*3/uL (ref 0.1–1.0)
Monocytes Relative: 5 %
Neutro Abs: 4.7 10*3/uL (ref 1.7–7.7)
Neutrophils Relative %: 77 %
Platelets: 207 10*3/uL (ref 150–400)
RBC: 4.51 MIL/uL (ref 3.87–5.11)
RDW: 13.6 % (ref 11.5–15.5)
WBC: 6.1 10*3/uL (ref 4.0–10.5)
nRBC: 0 % (ref 0.0–0.2)

## 2020-07-31 LAB — TROPONIN I (HIGH SENSITIVITY): Troponin I (High Sensitivity): 2 ng/L (ref <18)

## 2020-07-31 LAB — HIV ANTIBODY (ROUTINE TESTING W REFLEX): HIV Screen 4th Generation wRfx: NONREACTIVE

## 2020-07-31 LAB — C-REACTIVE PROTEIN: CRP: 0.8 mg/dL (ref <1.0)

## 2020-07-31 MED ORDER — LEVALBUTEROL TARTRATE 45 MCG/ACT IN AERO
1.0000 | INHALATION_SPRAY | Freq: Every day | RESPIRATORY_TRACT | Status: DC | PRN
  Filled 2020-07-31: qty 15

## 2020-07-31 MED ORDER — MINOXIDIL 5 % EX SOLN: 1.0000 "application " | Freq: Two times a day (BID) | CUTANEOUS | Status: DC

## 2020-07-31 MED ORDER — SODIUM CHLORIDE 0.9 % IV SOLN: INTRAVENOUS | Status: DC

## 2020-07-31 MED ORDER — SIMETHICONE 80 MG PO CHEW: 80.0000 mg | CHEWABLE_TABLET | Freq: Four times a day (QID) | ORAL | Status: DC | PRN

## 2020-07-31 MED ORDER — HYDROCORTISONE (PERIANAL) 2.5 % EX CREA: 1.0000 "application " | TOPICAL_CREAM | Freq: Two times a day (BID) | CUTANEOUS | Status: DC | PRN

## 2020-07-31 MED ORDER — POLYVINYL ALCOHOL 1.4 % OP SOLN: 1.0000 [drp] | Freq: Every day | OPHTHALMIC | Status: DC | PRN

## 2020-07-31 MED ORDER — LACTULOSE 10 GM/15ML PO SOLN: 20.0000 g | Freq: Three times a day (TID) | ORAL | Status: DC | PRN

## 2020-07-31 MED ORDER — FLUTICASONE PROPIONATE 50 MCG/ACT NA SUSP: 2.0000 | Freq: Every day | NASAL | Status: DC | PRN

## 2020-07-31 MED ORDER — ACETAMINOPHEN 80 MG PO CHEW: 160.0000 mg | CHEWABLE_TABLET | Freq: Four times a day (QID) | ORAL | Status: DC | PRN

## 2020-07-31 MED ORDER — METOPROLOL TARTRATE 25 MG PO TABS
25.0000 mg | ORAL_TABLET | Freq: Two times a day (BID) | ORAL | Status: DC
  Administered 2020-07-31: 25 mg via ORAL
  Filled 2020-07-31: qty 1

## 2020-07-31 MED ORDER — LEVALBUTEROL TARTRATE 45 MCG/ACT IN AERO
1.0000 | INHALATION_SPRAY | Freq: Every day | RESPIRATORY_TRACT | Status: DC | PRN
  Administered 2020-07-31: 1 via RESPIRATORY_TRACT

## 2020-07-31 MED ORDER — FAMOTIDINE 20 MG PO TABS
20.0000 mg | ORAL_TABLET | Freq: Two times a day (BID) | ORAL | Status: DC
  Administered 2020-07-31: 20 mg via ORAL
  Filled 2020-07-31 (×2): qty 1

## 2020-07-31 MED ORDER — PRAVASTATIN SODIUM 20 MG PO TABS
20.0000 mg | ORAL_TABLET | ORAL | Status: DC
Start: 2020-07-31 — End: 2020-07-31
  Filled 2020-07-31: qty 1

## 2020-07-31 MED ORDER — DICYCLOMINE HCL 20 MG PO TABS: 20.0000 mg | ORAL_TABLET | Freq: Two times a day (BID) | ORAL | Status: DC | PRN

## 2020-07-31 MED ORDER — FINASTERIDE 5 MG PO TABS
5.0000 mg | ORAL_TABLET | Freq: Every day | ORAL | Status: DC
  Filled 2020-07-31: qty 1

## 2020-07-31 MED ORDER — GUAIFENESIN 100 MG/5ML PO SOLN
200.0000 mg | Freq: Three times a day (TID) | ORAL | Status: DC | PRN
  Administered 2020-07-31: 200 mg via ORAL
  Filled 2020-07-31: qty 10

## 2020-07-31 MED ORDER — IMIQUIMOD 5 % EX CREA: 1.0000 "application " | TOPICAL_CREAM | CUTANEOUS | Status: DC

## 2020-07-31 MED ORDER — NITROGLYCERIN 0.4 MG SL SUBL
0.4000 mg | SUBLINGUAL_TABLET | SUBLINGUAL | Status: DC | PRN
  Administered 2020-07-31: 0.4 mg via SUBLINGUAL
  Filled 2020-07-31: qty 1

## 2020-07-31 MED ORDER — EPINEPHRINE 0.3 MG/0.3ML IJ SOAJ
0.3000 mg | Freq: Every day | INTRAMUSCULAR | Status: DC | PRN
  Filled 2020-07-31: qty 0.6

## 2020-07-31 MED ORDER — POTASSIUM CHLORIDE 20 MEQ PO PACK
40.0000 meq | PACK | Freq: Every day | ORAL | Status: DC
  Filled 2020-07-31: qty 2

## 2020-07-31 MED ORDER — FLUTICASONE FUROATE 27.5 MCG/SPRAY NA SUSP: 2.0000 | Freq: Every day | NASAL | Status: DC | PRN

## 2020-07-31 NOTE — Progress Notes (Signed)
Writer called to pt's room, as the pt is frequently calling out and stating she is not getting her medication and is receiving bad care by people not wanting to come into her room. Writer has spoken with doctor and he has ordered her Metroprolol Tartrate and changed her diet order to her preference. Alerted pt to our continued desire to meet all of her needs and expectations. Will continue to reiterate to her the importance of hospital policy related to pt care and also her keeping her meds in her room. Doctor is aware of pt refusing to have her meds taken to pharmacy.

## 2020-07-31 NOTE — Progress Notes (Signed)
Pt. Was demanding to take her metoprolol dose in the morning. MD paged and request to put in an order. However metoprolol is not in the list of her home meds. Pharmacy was informed, Pt.was threatening to take her home medication (took it in her purse) if metoprolol will not be ordered. Writer informed patient that her medication has to be sent to pharmacy for proper storage, Charge nurse went to bedside to take the medicine but patient refused. MD was notified.

## 2020-07-31 NOTE — Progress Notes (Addendum)
Patient is is very impulsive, irritable, manipulative and argumentative. She keeps demanding to see another doctor, a pulmonologist, retesting for COVID, complained of dietary not giving her what she wants despite it is in her allergies.Charge nurse came at bedside, [facility] talked to patient regarding her complaints and had her allergy sorted out. Whenever writer check on her during regular rounding she is irritable and yells complaining of stuff such as why RN has to wear gown and mask " you can have COVID whenever you go anyway" and having unreasonable demands, throwing her phone in the bed .Patient was  Informed that her doctor will see her again and talk to her again tom. and will not have COVID retesting done.

## 2020-07-31 NOTE — Progress Notes (Signed)
Patient complained of 10/10 chest pain and SOB. Rapid response notified. On call covering MD notified via [place].

## 2020-07-31 NOTE — Discharge Summary (Signed)
Physician [facility] Discharge Summary  Jennifer [name] DUK:[id number] DOB: 01/17/1966 DOA: 07/30/2020  PCP: [name], MD  Admit date: 07/30/2020 Discharge date: 07/31/2020  Admitted From: home Disposition:  Home, left AMA  Recommendations for Outpatient Follow-up:  1. Follow up with PCP ASAP  Home Health: none Equipment/Devices: none  Discharge Condition: guarded CODE STATUS: Full code Diet recommendation: heart healthy  HPI: Per admitting MD, Jennifer [name] is a 54 y.o. 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  She also has had full vaccinations.  Since then she has been having recurrent symptoms.  Her asthma has been flaring up.  She was recently eight [facility] where she was admitted with respiratory symptoms.  She was told she has some respiratory virus but Covid has been negative at least four times since her episode in July.  She went to [place] today where she complained of cough.  Her asthma is usually a cough variant asthma.  She also complains of shortness of breath.  She left [place] [facility] AGAINST MEDICAL ADVICE about 3 days ago.  In the ER patient's evaluation showed she is Covid 19+.  Did not show any evidence of hypoxemia but has some mild wheeze.  Initiated on treatment for acute exacerbation of asthma with COVID-19 infection.  Patient sent over for admission and observation.  She has been complaining of multidrug allergies.  She does not want to use steroids for the most part although agreed to low-dose Solu-Medrol.  Also has had problem with the use of albuterol and Atrovent.  She is only able to tolerate Xopenex.  Patient does not have any hypoxemia at the moment sats are 90 to  93%.  Admitted for observation and essentially monitoring.  Patient is questioning her Covid 71 serology reports.  Hospital Course / Discharge diagnoses:  Principal Problem Acute asthma exacerbation with acute hypoxic respiratory failure -wheezing on admission, started on IV steroids. Patient left AMA   Active Problems COVID-19 infection -She tested positive but has a clear chest x-ray without any infiltrates.  She is somewhat in disbelief that tested positive again after 4 negative tests in [facility].  She is more than 90 days from her prior Covid infection and this could certainly represent early infection. On steroids as above. Left AMA Palpitations -Resume home metoprolol Chest pain - during early morning hours 12/18. EKG unremarkable, high sensitivity troponin negative. Left AMA Hypokalemia -replete and monitor Multiple medication allergies -She has 58 allergies listed which makes treatment difficult.  Best to see an allergy specialist as an outpatient to determine which is a true allergy which is simple intolerance Obesity - based on BMI 39, patient would benefit from weight loss  Sepsis ruled out    Discharge Instructions   Allergies as of 07/31/2020      Reactions   Azithromycin Shortness Of Breath   Lip swelling, SOB.      Ciprofloxacin Swelling   REACTION: tongue swells   Codeine Shortness Of Breath   Erythromycin Base Itching, Rash   Sulfa Antibiotics Shortness Of Breath, Rash, Other (See Comments)   Sulfasalazine Rash, Shortness Of Breath   Other reaction(s): Other (See Comments) Other reaction(s): SHORTNESS OF BREATH   Telmisartan Swelling   Tongue swelling, Micardis   Ace Inhibitors Cough   Aspirin Hives, Other (  See Comments)   flushing   Atenolol Other (See Comments)   Squeezing chest sensation   Avelox [moxifloxacin Hcl In Nacl] Itching       Beta Adrenergic Blockers Other (See Comments)   Feels like chest tightening labetalol, bystolic  Feels like  chest tightening "Metoprolol"   Buspar [buspirone] Other (See Comments)   Light headed   Butorphanol Tartrate Other (See Comments)   Patient aggitated   Cetirizine Hives, Rash       Clonidine Hcl    REACTION: makes blood pressure high   Cortisone    Feels like she is going crazy   Erythromycin Rash   Fentanyl Other (See Comments)   aggressive    Fluoxetine Hcl Other (See Comments)   REACTION: headaches   Ketorolac Tromethamine    jittery   Lidocaine Other (See Comments)   When it involves the throat,    Lisinopril Cough   Metoclopramide Hcl Other (See Comments)   Dystonic reaction   Midazolam Other (See Comments)   agitation Slow to wake up   Montelukast Other (See Comments)   Singulair   Montelukast Sodium Other (See Comments)   DOES NOT REMEMBER Don't remember-told not to take   Naproxen Other (See Comments)   FLUSHING Pt states she took Ibuprofen today (10/08/19)   Paroxetine Other (See Comments)   REACTION: headaches   Penicillins Rash   Pravastatin Other (See Comments)   Myalgias   Promethazine Other (See Comments)   Dystonic reaction   Promethazine Hcl Other (See Comments)   jittery   Quinolones Swelling, Rash   Serotonin Reuptake Inhibitors (ssris) Other (See Comments)   Headache Effexor, prozac, zoloft,    Sertraline Hcl    REACTION: headaches   Stelazine [trifluoperazine] Other (See Comments)   Dystonic reaction   Tobramycin Itching, Rash   Trifluoperazine Hcl    dystonic   Atrovent Nasal Spray [ipratropium]    Tachycardia and shaking   Diltiazem Other (See Comments)   Chest pain   Polyethylene Glycol 3350    Other reaction(s): Laryngeal Edema (ALLERGY)   Propoxyphene    Adhesive [tape] Rash   EKG monitor patches, some tapes Blisters, rash, itching, welts.   Butorphanol Anxiety   Patient agitated   Ceftriaxone Rash   rocephin   Iron Rash   Flushing with certain IV types   Metoclopramide Itching, Other (See Comments)   Dystonic reaction    Metronidazole Rash   Other Rash, Other (See Comments)   Uncoded Allergy. Allergen: steriods, Other Reaction: Not Assessed Other reaction(s): Flushing (ALLERGY/intolerance), GI Upset (intolerance), Hypertension (intolerance), Increased Heart Rate (intolerance), Mental Status Changes (intolerance), Other (See Comments), Tachycardia / Palpitations(intolerance) Hospital gowns leave a rash.   Prednisone Anxiety, Palpitations   Prochlorperazine Anxiety   Compazine:  Dystonic reaction   Venlafaxine Anxiety   Zyrtec [cetirizine Hcl] Rash   All over body    Med Rec must be completed prior to using this [place]  Consultations:  None   Procedures/Studies  US Abdomen Complete  Result Date: 07/22/2020 CLINICAL DATA:  Right upper quadrant pain beginning yesterday. Patient is not NPO. EXAM: ABDOMEN ULTRASOUND COMPLETE COMPARISON:  CT 05/07/2020. FINDINGS: Gallbladder: Surgically absent. Common bile duct: Diameter: Not visible. Certainly the common duct is not enlarged. Liver: Diffusely increased echogenicity suggesting steatosis. No focal lesion. No sign of intrahepatic ductal dilatation. Portal vein is patent on color Doppler imaging with normal direction of blood flow towards the liver. IVC: Not visible because of overlying bowel gas. Pancreas: Partially  obscured by overlying bowel gas. Visualized portions appear normal. Spleen: Size and appearance within normal limits. Right Kidney: Length: 11.5 cm. Echogenicity within normal limits. No mass or hydronephrosis visualized. Left Kidney: Length: 11.2 cm. Echogenicity within normal limits. No mass or hydronephrosis visualized. Abdominal aorta: Poorly seen because of overlying bowel gas. Other findings: No sign of ascites. IMPRESSION: Previous cholecystectomy. Diffusely echogenic liver suggesting steatosis. No sign of focal lesion or ductal dilatation. Other limitations as above due to overlying bowel gas and bowel contents. Patient not NPO. Electronically  Signed   By: Nelson Chimes M.D.   On: 07/22/2020 09:57   DG CHEST PORT 1 VIEW  Result Date: 07/31/2020 CLINICAL DATA:  Shortness of breath EXAM: PORTABLE CHEST 1 VIEW COMPARISON:  07/30/2020 FINDINGS: The heart size and mediastinal contours are within normal limits. Both lungs are clear. The visualized skeletal structures are unremarkable. IMPRESSION: No active disease. Electronically Signed   By: Ulyses Jarred M.D.   On: 07/31/2020 03:50   DG Chest Portable 1 View  Result Date: 07/30/2020 CLINICAL DATA:  54 year old female with cough shortness of breath and wheezing for 1 week. EXAM: PORTABLE CHEST 1 VIEW COMPARISON:  Chest radiographs 07/27/2020 and earlier. FINDINGS: Portable AP upright view at 0748 hours. Stable low normal lung volumes. Mediastinal contours remain normal. Visualized tracheal air column is within normal limits. Allowing for portable technique the lungs are clear. No pneumothorax or pleural effusions. No acute osseous abnormality identified. Negative visible bowel gas pattern. IMPRESSION: Negative portable chest. Electronically Signed   By: Genevie Ann M.D.   On: 07/30/2020 08:19       The results of significant diagnostics from this hospitalization (including imaging, microbiology, ancillary and laboratory) are listed below for reference.     Microbiology: Recent Results (from the past 240 hour(s))  Resp Panel by RT-PCR (Flu A&B, Covid) Nasopharyngeal Swab     Status: Abnormal   Collection Time: 07/30/20  8:11 AM   Specimen: Nasopharyngeal Swab; Nasopharyngeal(NP) swabs in vial transport medium  Result Value Ref Range Status   SARS Coronavirus 2 by RT PCR POSITIVE (A) NEGATIVE Final    Comment: RESULT CALLED TO, READ BACK BY AND VERIFIED WITH: [place] M RN [phone] [id number] PHILLIPS C (NOTE) SARS-CoV-2 target nucleic acids are DETECTED.  The SARS-CoV-2 RNA is generally detectable in upper respiratory specimens during the acute phase of infection. Positive results  are indicative of the presence of the identified virus, but do not rule out bacterial infection or co-infection with other pathogens not detected by the test. Clinical correlation with patient history and other diagnostic information is necessary to determine patient infection status. The expected result is Negative.  Fact Sheet for Patients: EntrepreneurPulse.com.au  Fact Sheet for Healthcare Providers: IncredibleEmployment.be  This test is not yet approved or cleared by the [place] FDA and  has been authorized for detection and/or diagnosis of SARS-CoV-2 by FDA under an Emergency Use Authorization (EUA).  This EUA will remain in effect (meaning this test can be  used) for the duration of  the COVID-19 declaration under Section 564(b)(1) of the Act, 21 U.S.C. section 360bbb-3(b)(1), unless the authorization is terminated or revoked sooner.     Influenza A by PCR NEGATIVE NEGATIVE Final   Influenza B by PCR NEGATIVE NEGATIVE Final    Comment: (NOTE) The Xpert Xpress SARS-CoV-2/FLU/RSV plus assay is intended as an aid in the diagnosis of influenza from Nasopharyngeal swab specimens and should not be used as a sole basis for treatment. Nasal  washings and aspirates are unacceptable for Xpert Xpress SARS-CoV-2/FLU/RSV testing.  Fact Sheet for Patients: EntrepreneurPulse.com.au  Fact Sheet for Healthcare Providers: IncredibleEmployment.be  This test is not yet approved or cleared by the [place] FDA and has been authorized for detection and/or diagnosis of SARS-CoV-2 by FDA under an Emergency Use Authorization (EUA). This EUA will remain in effect (meaning this test can be used) for the duration of the COVID-19 declaration under Section 564(b)(1) of the Act, 21 U.S.C. section 360bbb-3(b)(1), unless the authorization is terminated or revoked.  Performed at [facility], [address] [place], [place] [address]   Blood Culture (routine x 2)     Status: None (Preliminary result)   Collection Time: 07/30/20 10:05 AM   Specimen: BLOOD RIGHT ARM  Result Value Ref Range Status   Specimen Description   Final    BLOOD RIGHT ARM Performed at [facility], [address] [place], [place] [address]    Special Requests   Final    BOTTLES DRAWN AEROBIC AND ANAEROBIC Blood Culture adequate volume Performed at [facility], [address] [place], [place] [address]    Culture   Final    NO GROWTH 1 DAY Performed at [facility], [place] [address] [place], [place] [address]    Report Status PENDING  Incomplete  Blood Culture (routine x 2)     Status: None (Preliminary result)   Collection Time: 07/30/20 10:30 AM   Specimen: BLOOD LEFT HAND  Result Value Ref Range Status   Specimen Description   Final    BLOOD LEFT HAND Performed at [name], [address] [place], [place] [address]    Special Requests   Final    BOTTLES DRAWN AEROBIC AND ANAEROBIC Blood Culture adequate volume Performed at [facility], [address] [place], [place] [address]    Culture   Final    NO GROWTH 1 DAY Performed at [facility], [place] [address], [place], [place] [address]    Report Status PENDING  Incomplete     Labs: Basic Metabolic Panel: Recent Labs  Lab 07/30/20 0748 07/31/20 0324  NA 143 141  K 3.4* 3.8  CL 109 106  CO2 22 22  GLUCOSE 149* 165*  BUN 18 15  CREATININE 0.76 0.52  CALCIUM 8.7* 8.9   Liver Function Tests: Recent Labs  Lab 07/30/20 1002 07/31/20 0324  AST 19 18  ALT 22 25  ALKPHOS 58 55  BILITOT 0.5 0.3  PROT 6.9 6.7  ALBUMIN 3.9 3.7   CBC: Recent Labs  Lab 07/30/20 0748 07/31/20 0324  WBC 4.1 6.1  NEUTROABS 1.9 4.7  HGB 13.1 12.6  HCT 40.3 39.8  MCV 86.9 88.2  PLT 187 207   CBG: No results for input(s): GLUCAP in the last 168 hours. Hgb A1c No results for input(s): HGBA1C in  the last 72 hours. Lipid Profile Recent Labs    07/30/20 1002  TRIG 165*   Thyroid function studies No results for input(s): TSH, T4TOTAL, T3FREE, THYROIDAB in the last 72 hours.  Invalid input(s): FREET3 Urinalysis    Component Value Date/Time   COLORURINE YELLOW 11/09/2019 1432   APPEARANCEUR CLEAR 11/09/2019 1432   LABSPEC 1.025 11/09/2019 1432   LABSPEC 1.010 10/20/2013 1557   PHURINE 5.5 11/09/2019 1432   GLUCOSEU NEGATIVE 11/09/2019 1432   GLUCOSEU Negative 10/20/2013 1557   HGBUR NEGATIVE 11/09/2019  1432   HGBUR negative 08/09/2009 1108   BILIRUBINUR negative 05/28/2020 1657   BILIRUBINUR neg 10/14/2019 0925   BILIRUBINUR Negative 10/20/2013 1557   KETONESUR trace (5) (A) 05/28/2020 1657   KETONESUR NEGATIVE 11/09/2019 1432   PROTEINUR NEGATIVE 11/09/2019 1432   UROBILINOGEN 0.2 05/28/2020 1657   UROBILINOGEN 0.2 03/19/2015 1340   UROBILINOGEN 0.2 10/20/2013 1557   NITRITE Negative 05/28/2020 1657   NITRITE NEGATIVE 11/09/2019 1432   LEUKOCYTESUR Negative 05/28/2020 1657   LEUKOCYTESUR NEGATIVE 11/09/2019 1432   LEUKOCYTESUR Trace 10/20/2013 1557    FURTHER DISCHARGE INSTRUCTIONS:   Get Medicines reviewed and adjusted: Please take all your medications with you for your next visit with your Primary MD   Laboratory/radiological data: Please request your Primary MD to go over all hospital tests and procedure/radiological results at the follow up, please ask your Primary MD to get all Hospital records sent to his/her office.   In some cases, they will be blood work, cultures and biopsy results pending at the time of your discharge. Please request that your primary care M.D. goes through all the records of your hospital data and follows up on these results.   Also Note the following: If you experience worsening of your admission symptoms, develop shortness of breath, life threatening emergency, suicidal or homicidal thoughts you must seek medical attention  immediately by calling 911 or calling your MD immediately  if symptoms less severe.   You must read complete instructions/literature along with all the possible adverse reactions/side effects for all the Medicines you take and that have been prescribed to you. Take any new Medicines after you have completely understood and accpet all the possible adverse reactions/side effects.    Do not drive when taking Pain medications or sleeping medications (Benzodaizepines)   Do not take more than prescribed Pain, Sleep and Anxiety Medications. It is not advisable to combine anxiety,sleep and pain medications without talking with your primary care practitioner   Special Instructions: If you have smoked or chewed Tobacco  in the last 2 yrs please stop smoking, stop any regular Alcohol  and or any Recreational drug use.   Wear Seat belts while driving.   Please note: You were cared for by a hospitalist during your hospital stay. Once you are discharged, your primary care physician will handle any further medical issues. Please note that NO REFILLS for any discharge medications will be authorized once you are discharged, as it is imperative that you return to your primary care physician (or establish a relationship with a primary care physician if you do not have one) for your post hospital discharge needs so that they can reassess your need for medications and monitor your lab values.  Time coordinating discharge: 15 minutes  SIGNED:  Marzetta Board, MD, PhD 07/31/2020, 4:52 PM

## 2020-07-31 NOTE — Progress Notes (Signed)
Pt. wants her peripheral  IV removed stating she is going home. Pt. was informed that she will be discharged tom.but she insist to go now. when asked who is picking her up she stated her husband will come to take her home. Explained about the process of discharge, pt.is argumentative and refused to sign AMA form. Pt. take off her telemetry leads, took off her pulse oximetry monitoring and threatened to walk out the unit. MD was notified, no further orders. Pt. Was discharge against medical advise. CN and MD are notified.

## 2020-07-31 NOTE — Progress Notes (Signed)
Rapid Response Event Note   Reason for Call :  10/10 Chest pain and SOB  Initial Focused Assessment:  Pt alert and oriented x4. Lung sounds: coarse rhonchi bilaterally, uppers&lowers. O2 sat 96% RA. Pt seen holding chest and rocking back and forth. C/o 10/10 pain in center of chest but denies pain anywhere else. Pt describes pain as sharp and heavy like someone is sitting on chest. BP 150s   Interventions:  Initiated chest pain standing orders. 0.5 Nitro SL given, 73ml KVO started, EKGx2 taken, 4L O2 [place] started. CXR taken, Troponin and other lab collected. Sputum sample needed. NP updated  Event Summary:  O2 turned down to 2L [place] for comfort, O2 sat 97%. Chest pain decreased 7/10. Pt visibly looks like she feeling better, no longer rocking and holding chest. Pt able to hold conversation w/o SOB s/s. Pt educated on Coivd 19, drinking more water and side-lying during sleep.  Floor RN will follow up with on call NP (triad) when CXR and labs result  MD Notified: Kennon Holter, NP Call Time: [place] Time: 0205a End Time: [place], RN

## 2020-07-31 NOTE — Progress Notes (Signed)
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  She apparently was hospitalized at [facility] with respiratory symptoms, and was told that Covid was negative at least 4 times.  Per H&P she left [place] AMA 3 days ago.  Subjective / 24h Interval events: Reported excruciating chest pain overnight and early this morning, "pressure-like".  Chest improved on my evaluation but continues to feel poorly this morning, and continues to have shortness of breath  Assessment & Plan: Principal Problem Acute asthma exacerbation with acute hypoxic respiratory failure -Still has wheezing this morning, continue IV steroids, currently on 2 L nasal cannula, wean off to room air as tolerated  Active Problems COVID-19 infection -She tested positive but has a clear chest x-ray without any infiltrates.  She is somewhat in disbelief that tested positive again after 4 negative tests in [facility].  She is more than 90 days from her prior Covid infection and this could certainly represent early infection -No infiltrate on chest x-ray, keep on steroids.  Palpitations -Resume home metoprolol  Hypokalemia -replete and monitor  Multiple medication allergies -She has 58 allergies listed which makes treatment difficult.  Best to see an allergy specialist as an outpatient to determine which is a true allergy which is simple intolerance  Scheduled Meds: . enoxaparin (LOVENOX) injection  50 mg Subcutaneous Q24H  . famotidine  20 mg Oral BID  . finasteride  5 mg Oral Daily  . [START  ON 08/02/2020] imiquimod  1 application Topical Once per day on Mon Thu  . methylPREDNISolone (SOLU-MEDROL) injection  40 mg Intravenous Q12H  . metoprolol tartrate  25 mg Oral BID  . MINOXIDIL (TOPICAL)  1 application Apply externally BID  . potassium chloride  40 mEq Oral Daily  . pravastatin  20 mg Oral QODAY   Continuous Infusions: . sodium chloride Stopped (07/31/20 0927)   PRN Meds:.acetaminophen, dicyclomine, EPINEPHrine, fluticasone, guaiFENesin, hydrocortisone, lactulose, levalbuterol, levalbuterol, nitroGLYCERIN, ondansetron **OR** ondansetron (ZOFRAN) IV, polyvinyl alcohol, simethicone  Diet Orders (From admission, onward)    Start     Ordered   07/30/20 2006  Diet Heart Room service appropriate? Yes; Fluid consistency: Thin  Diet effective now       Question Answer Comment  Room service appropriate? Yes   Fluid consistency: Thin      07/30/20 2008          DVT prophylaxis:      Code Status: Full Code  Family Communication: no family at bedside  Status is: Observation  The patient will require care spanning > 2 midnights and should be moved to inpatient because: Ongoing wheezing   Dispo: The patient is from: Home              Anticipated d/c is to: Home              Anticipated d/c date is: 2 days              Patient currently is not medically stable to d/c.  Consultants:  None   Procedures:  None   Microbiology  None   Antimicrobials: None     Objective: Vitals:  07/30/20 2242 07/31/20 0238 07/31/20 0410 07/31/20 0635  BP: (!) 154/106 (!) 158/100  (!) 151/85  Pulse: 96 (!) 104  63  Resp: 20 20  20   Temp: 98.7 F (37.1 C) 98.5 F (36.9 C)  97.7 F (36.5 C)  TempSrc: Oral Oral  Oral  SpO2: 97% 100% 97% 96%  Weight:      Height:       No intake or output data in the 24 hours ending 07/31/20 1022 Filed Weights   07/30/20 0725 07/30/20 1830  Weight: 97.5 kg 97.5 kg    Examination:  Constitutional: NAD Eyes: no scleral  icterus ENMT: Mucous membranes are moist.  Neck: normal, supple Respiratory: End expiratory wheezing present bilaterally, no crackles.  Increased respiratory effort Cardiovascular: Regular rate and rhythm, no murmurs / rubs / gallops. No LE edema.  Abdomen: non distended, no tenderness. Bowel sounds positive.  Musculoskeletal: no clubbing / cyanosis.  Skin: no rashes Neurologic: No focal deficits  Data Reviewed: I have independently reviewed following labs and imaging studies   CBC: Recent Labs  Lab 07/30/20 0748 07/31/20 0324  WBC 4.1 6.1  NEUTROABS 1.9 4.7  HGB 13.1 12.6  HCT 40.3 39.8  MCV 86.9 88.2  PLT 187 433   Basic Metabolic Panel: Recent Labs  Lab 07/30/20 0748 07/31/20 0324  NA 143 141  K 3.4* 3.8  CL 109 106  CO2 22 22  GLUCOSE 149* 165*  BUN 18 15  CREATININE 0.76 0.52  CALCIUM 8.7* 8.9   Liver Function Tests: Recent Labs  Lab 07/30/20 1002 07/31/20 0324  AST 19 18  ALT 22 25  ALKPHOS 58 55  BILITOT 0.5 0.3  PROT 6.9 6.7  ALBUMIN 3.9 3.7   Coagulation Profile: No results for input(s): INR, PROTIME in the last 168 hours. HbA1C: No results for input(s): HGBA1C in the last 72 hours. CBG: No results for input(s): GLUCAP in the last 168 hours.  Recent Results (from the past 240 hour(s))  Resp Panel by RT-PCR (Flu A&B, Covid) Nasopharyngeal Swab     Status: Abnormal   Collection Time: 07/30/20  8:11 AM   Specimen: Nasopharyngeal Swab; Nasopharyngeal(NP) swabs in vial transport medium  Result Value Ref Range Status   SARS Coronavirus 2 by RT PCR POSITIVE (A) NEGATIVE Final    Comment: RESULT CALLED TO, READ BACK BY AND VERIFIED WITH: [place] M RN [phone] PHILLIPS C (NOTE) SARS-CoV-2 target nucleic acids are DETECTED.  The SARS-CoV-2 RNA is generally detectable in upper respiratory specimens during the acute phase of infection. Positive results are indicative of the presence of the identified virus, but do not rule out bacterial infection  or co-infection with other pathogens not detected by the test. Clinical correlation with patient history and other diagnostic information is necessary to determine patient infection status. The expected result is Negative.  Fact Sheet for Patients: EntrepreneurPulse.com.au  Fact Sheet for Healthcare Providers: IncredibleEmployment.be  This test is not yet approved or cleared by the [place] FDA and  has been authorized for detection and/or diagnosis of SARS-CoV-2 by FDA under an Emergency Use Authorization (EUA).  This EUA will remain in effect (meaning this test can be  used) for the duration of  the COVID-19 declaration under Section 564(b)(1) of the Act, 21 U.S.C. section 360bbb-3(b)(1), unless the authorization is terminated or revoked sooner.     Influenza A by PCR NEGATIVE NEGATIVE Final   Influenza B by PCR NEGATIVE NEGATIVE Final    Comment: (NOTE) The  Xpert Xpress SARS-CoV-2/FLU/RSV plus assay is intended as an aid in the diagnosis of influenza from Nasopharyngeal swab specimens and should not be used as a sole basis for treatment. Nasal washings and aspirates are unacceptable for Xpert Xpress SARS-CoV-2/FLU/RSV testing.  Fact Sheet for Patients: EntrepreneurPulse.com.au  Fact Sheet for Healthcare Providers: IncredibleEmployment.be  This test is not yet approved or cleared by the [place] FDA and has been authorized for detection and/or diagnosis of SARS-CoV-2 by FDA under an Emergency Use Authorization (EUA). This EUA will remain in effect (meaning this test can be used) for the duration of the COVID-19 declaration under Section 564(b)(1) of the Act, 21 U.S.C. section 360bbb-3(b)(1), unless the authorization is terminated or revoked.  Performed at [facility], [address] [place], [place] [address]      Radiology Studies: DG CHEST PORT 1 VIEW  Result Date:  07/31/2020 CLINICAL DATA:  Shortness of breath EXAM: PORTABLE CHEST 1 VIEW COMPARISON:  07/30/2020 FINDINGS: The heart size and mediastinal contours are within normal limits. Both lungs are clear. The visualized skeletal structures are unremarkable. IMPRESSION: No active disease. Electronically Signed   By: Ulyses Jarred M.D.   On: 07/31/2020 03:50    Marzetta Board, MD, PhD [facility]  Between 7 am - 7 pm I am available, please contact me via Amion or Securechat  Between 7 pm - 7 am I am not available, please contact night coverage MD/APP via Amion

## 2020-08-01 DIAGNOSIS — R569 Unspecified convulsions: Secondary | ICD-10-CM | POA: Diagnosis not present

## 2020-08-01 DIAGNOSIS — Z20822 Contact with and (suspected) exposure to covid-19: Secondary | ICD-10-CM | POA: Diagnosis not present

## 2020-08-01 DIAGNOSIS — E1169 Type 2 diabetes mellitus with other specified complication: Secondary | ICD-10-CM | POA: Diagnosis not present

## 2020-08-01 DIAGNOSIS — Z8542 Personal history of malignant neoplasm of other parts of uterus: Secondary | ICD-10-CM | POA: Diagnosis not present

## 2020-08-01 DIAGNOSIS — I498 Other specified cardiac arrhythmias: Secondary | ICD-10-CM | POA: Diagnosis not present

## 2020-08-01 DIAGNOSIS — G4730 Sleep apnea, unspecified: Secondary | ICD-10-CM | POA: Diagnosis not present

## 2020-08-01 DIAGNOSIS — Z8616 Personal history of COVID-19: Secondary | ICD-10-CM | POA: Diagnosis not present

## 2020-08-01 DIAGNOSIS — R9431 Abnormal electrocardiogram [ECG] [EKG]: Secondary | ICD-10-CM | POA: Diagnosis not present

## 2020-08-01 DIAGNOSIS — R059 Cough, unspecified: Secondary | ICD-10-CM | POA: Diagnosis not present

## 2020-08-01 DIAGNOSIS — R0602 Shortness of breath: Secondary | ICD-10-CM | POA: Diagnosis not present

## 2020-08-01 DIAGNOSIS — E785 Hyperlipidemia, unspecified: Secondary | ICD-10-CM | POA: Diagnosis not present

## 2020-08-02 DIAGNOSIS — J9811 Atelectasis: Secondary | ICD-10-CM | POA: Diagnosis not present

## 2020-08-02 DIAGNOSIS — Z87891 Personal history of nicotine dependence: Secondary | ICD-10-CM | POA: Diagnosis not present

## 2020-08-02 DIAGNOSIS — J45901 Unspecified asthma with (acute) exacerbation: Secondary | ICD-10-CM | POA: Diagnosis not present

## 2020-08-02 DIAGNOSIS — R0602 Shortness of breath: Secondary | ICD-10-CM | POA: Diagnosis not present

## 2020-08-02 DIAGNOSIS — I517 Cardiomegaly: Secondary | ICD-10-CM | POA: Diagnosis not present

## 2020-08-02 NOTE — Telephone Encounter (Signed)
Pt was discharged and went right back to ed after breathing treatment  as she is worse. She is horse and having difficulty breathing

## 2020-08-04 DIAGNOSIS — E876 Hypokalemia: Secondary | ICD-10-CM | POA: Diagnosis not present

## 2020-08-04 DIAGNOSIS — R1310 Dysphagia, unspecified: Secondary | ICD-10-CM | POA: Diagnosis not present

## 2020-08-04 DIAGNOSIS — Z20822 Contact with and (suspected) exposure to covid-19: Secondary | ICD-10-CM | POA: Diagnosis not present

## 2020-08-04 DIAGNOSIS — R079 Chest pain, unspecified: Secondary | ICD-10-CM | POA: Diagnosis not present

## 2020-08-04 DIAGNOSIS — J90 Pleural effusion, not elsewhere classified: Secondary | ICD-10-CM | POA: Diagnosis not present

## 2020-08-04 DIAGNOSIS — J209 Acute bronchitis, unspecified: Secondary | ICD-10-CM | POA: Diagnosis not present

## 2020-08-04 DIAGNOSIS — F419 Anxiety disorder, unspecified: Secondary | ICD-10-CM | POA: Diagnosis not present

## 2020-08-04 DIAGNOSIS — J9811 Atelectasis: Secondary | ICD-10-CM | POA: Diagnosis not present

## 2020-08-04 DIAGNOSIS — R0602 Shortness of breath: Secondary | ICD-10-CM | POA: Diagnosis not present

## 2020-08-04 DIAGNOSIS — Z87891 Personal history of nicotine dependence: Secondary | ICD-10-CM | POA: Diagnosis not present

## 2020-08-04 DIAGNOSIS — J9601 Acute respiratory failure with hypoxia: Secondary | ICD-10-CM | POA: Diagnosis not present

## 2020-08-04 DIAGNOSIS — E1165 Type 2 diabetes mellitus with hyperglycemia: Secondary | ICD-10-CM | POA: Diagnosis not present

## 2020-08-04 DIAGNOSIS — E119 Type 2 diabetes mellitus without complications: Secondary | ICD-10-CM | POA: Diagnosis not present

## 2020-08-04 DIAGNOSIS — Z79899 Other long term (current) drug therapy: Secondary | ICD-10-CM | POA: Diagnosis not present

## 2020-08-04 DIAGNOSIS — J45901 Unspecified asthma with (acute) exacerbation: Secondary | ICD-10-CM | POA: Diagnosis not present

## 2020-08-04 DIAGNOSIS — I10 Essential (primary) hypertension: Secondary | ICD-10-CM | POA: Diagnosis not present

## 2020-08-04 LAB — CULTURE, BLOOD (ROUTINE X 2)
Culture: NO GROWTH
Culture: NO GROWTH
Special Requests: ADEQUATE
Special Requests: ADEQUATE

## 2020-08-05 DIAGNOSIS — R9431 Abnormal electrocardiogram [ECG] [EKG]: Secondary | ICD-10-CM | POA: Diagnosis not present

## 2020-08-05 DIAGNOSIS — E1169 Type 2 diabetes mellitus with other specified complication: Secondary | ICD-10-CM | POA: Diagnosis not present

## 2020-08-05 DIAGNOSIS — I10 Essential (primary) hypertension: Secondary | ICD-10-CM | POA: Diagnosis not present

## 2020-08-05 DIAGNOSIS — F419 Anxiety disorder, unspecified: Secondary | ICD-10-CM | POA: Diagnosis not present

## 2020-08-05 DIAGNOSIS — J45901 Unspecified asthma with (acute) exacerbation: Secondary | ICD-10-CM | POA: Diagnosis not present

## 2020-08-06 DIAGNOSIS — J45901 Unspecified asthma with (acute) exacerbation: Secondary | ICD-10-CM | POA: Diagnosis not present

## 2020-08-06 DIAGNOSIS — J209 Acute bronchitis, unspecified: Secondary | ICD-10-CM | POA: Diagnosis not present

## 2020-08-06 DIAGNOSIS — I10 Essential (primary) hypertension: Secondary | ICD-10-CM | POA: Diagnosis not present

## 2020-08-06 DIAGNOSIS — E119 Type 2 diabetes mellitus without complications: Secondary | ICD-10-CM | POA: Diagnosis not present

## 2020-08-08 DIAGNOSIS — K317 Polyp of stomach and duodenum: Secondary | ICD-10-CM | POA: Diagnosis not present

## 2020-08-08 DIAGNOSIS — K5909 Other constipation: Secondary | ICD-10-CM | POA: Diagnosis not present

## 2020-08-08 DIAGNOSIS — R1310 Dysphagia, unspecified: Secondary | ICD-10-CM | POA: Diagnosis not present

## 2020-08-08 DIAGNOSIS — G8929 Other chronic pain: Secondary | ICD-10-CM | POA: Diagnosis not present

## 2020-08-08 DIAGNOSIS — D120 Benign neoplasm of cecum: Secondary | ICD-10-CM | POA: Diagnosis not present

## 2020-08-08 DIAGNOSIS — R109 Unspecified abdominal pain: Secondary | ICD-10-CM | POA: Diagnosis not present

## 2020-08-08 DIAGNOSIS — R0602 Shortness of breath: Secondary | ICD-10-CM | POA: Diagnosis not present

## 2020-08-08 DIAGNOSIS — N2889 Other specified disorders of kidney and ureter: Secondary | ICD-10-CM | POA: Diagnosis not present

## 2020-08-08 DIAGNOSIS — K648 Other hemorrhoids: Secondary | ICD-10-CM | POA: Diagnosis not present

## 2020-08-08 DIAGNOSIS — R079 Chest pain, unspecified: Secondary | ICD-10-CM | POA: Diagnosis not present

## 2020-08-08 DIAGNOSIS — R0789 Other chest pain: Secondary | ICD-10-CM | POA: Diagnosis not present

## 2020-08-08 DIAGNOSIS — K760 Fatty (change of) liver, not elsewhere classified: Secondary | ICD-10-CM | POA: Diagnosis not present

## 2020-08-08 DIAGNOSIS — R1084 Generalized abdominal pain: Secondary | ICD-10-CM | POA: Diagnosis not present

## 2020-08-08 DIAGNOSIS — K220 Achalasia of cardia: Secondary | ICD-10-CM | POA: Diagnosis not present

## 2020-08-08 DIAGNOSIS — K859 Acute pancreatitis without necrosis or infection, unspecified: Secondary | ICD-10-CM | POA: Diagnosis not present

## 2020-08-09 ENCOUNTER — Telehealth: Payer: Medicare HMO | Admitting: Family Medicine

## 2020-08-09 DIAGNOSIS — R1084 Generalized abdominal pain: Secondary | ICD-10-CM | POA: Diagnosis not present

## 2020-08-09 DIAGNOSIS — N2881 Hypertrophy of kidney: Secondary | ICD-10-CM | POA: Diagnosis not present

## 2020-08-09 DIAGNOSIS — K859 Acute pancreatitis without necrosis or infection, unspecified: Secondary | ICD-10-CM | POA: Diagnosis not present

## 2020-08-09 DIAGNOSIS — R079 Chest pain, unspecified: Secondary | ICD-10-CM | POA: Diagnosis not present

## 2020-08-10 DIAGNOSIS — R0789 Other chest pain: Secondary | ICD-10-CM | POA: Diagnosis not present

## 2020-08-10 DIAGNOSIS — Z8616 Personal history of COVID-19: Secondary | ICD-10-CM | POA: Diagnosis not present

## 2020-08-10 DIAGNOSIS — K581 Irritable bowel syndrome with constipation: Secondary | ICD-10-CM | POA: Diagnosis not present

## 2020-08-10 DIAGNOSIS — R1013 Epigastric pain: Secondary | ICD-10-CM | POA: Diagnosis not present

## 2020-08-10 DIAGNOSIS — R9431 Abnormal electrocardiogram [ECG] [EKG]: Secondary | ICD-10-CM | POA: Diagnosis not present

## 2020-08-10 DIAGNOSIS — K625 Hemorrhage of anus and rectum: Secondary | ICD-10-CM | POA: Diagnosis not present

## 2020-08-10 DIAGNOSIS — R079 Chest pain, unspecified: Secondary | ICD-10-CM | POA: Diagnosis not present

## 2020-08-10 DIAGNOSIS — I471 Supraventricular tachycardia: Secondary | ICD-10-CM | POA: Diagnosis not present

## 2020-08-10 DIAGNOSIS — R1310 Dysphagia, unspecified: Secondary | ICD-10-CM | POA: Diagnosis not present

## 2020-08-10 DIAGNOSIS — R1084 Generalized abdominal pain: Secondary | ICD-10-CM | POA: Diagnosis not present

## 2020-08-10 DIAGNOSIS — R6884 Jaw pain: Secondary | ICD-10-CM | POA: Diagnosis not present

## 2020-08-10 DIAGNOSIS — I10 Essential (primary) hypertension: Secondary | ICD-10-CM | POA: Diagnosis not present

## 2020-08-10 DIAGNOSIS — K219 Gastro-esophageal reflux disease without esophagitis: Secondary | ICD-10-CM | POA: Diagnosis not present

## 2020-08-10 DIAGNOSIS — K5909 Other constipation: Secondary | ICD-10-CM | POA: Diagnosis not present

## 2020-08-10 DIAGNOSIS — G8929 Other chronic pain: Secondary | ICD-10-CM | POA: Diagnosis not present

## 2020-08-10 DIAGNOSIS — I491 Atrial premature depolarization: Secondary | ICD-10-CM | POA: Diagnosis not present

## 2020-08-10 DIAGNOSIS — R7303 Prediabetes: Secondary | ICD-10-CM | POA: Diagnosis not present

## 2020-08-10 DIAGNOSIS — F419 Anxiety disorder, unspecified: Secondary | ICD-10-CM | POA: Diagnosis not present

## 2020-08-11 DIAGNOSIS — K219 Gastro-esophageal reflux disease without esophagitis: Secondary | ICD-10-CM | POA: Diagnosis not present

## 2020-08-11 DIAGNOSIS — F419 Anxiety disorder, unspecified: Secondary | ICD-10-CM | POA: Diagnosis not present

## 2020-08-11 DIAGNOSIS — R1084 Generalized abdominal pain: Secondary | ICD-10-CM | POA: Diagnosis not present

## 2020-08-11 DIAGNOSIS — R079 Chest pain, unspecified: Secondary | ICD-10-CM | POA: Diagnosis not present

## 2020-08-11 NOTE — Progress Notes (Signed)
Please let Ayslin know that Hops was negative too. Thank you!

## 2020-08-12 ENCOUNTER — Telehealth: Payer: Self-pay | Admitting: Family Medicine

## 2020-08-12 DIAGNOSIS — K635 Polyp of colon: Secondary | ICD-10-CM | POA: Diagnosis not present

## 2020-08-12 DIAGNOSIS — R000 Tachycardia, unspecified: Secondary | ICD-10-CM | POA: Diagnosis not present

## 2020-08-12 DIAGNOSIS — K317 Polyp of stomach and duodenum: Secondary | ICD-10-CM | POA: Diagnosis not present

## 2020-08-12 DIAGNOSIS — R0789 Other chest pain: Secondary | ICD-10-CM | POA: Diagnosis not present

## 2020-08-12 DIAGNOSIS — K5730 Diverticulosis of large intestine without perforation or abscess without bleeding: Secondary | ICD-10-CM | POA: Diagnosis not present

## 2020-08-12 DIAGNOSIS — R079 Chest pain, unspecified: Secondary | ICD-10-CM | POA: Diagnosis not present

## 2020-08-12 DIAGNOSIS — R195 Other fecal abnormalities: Secondary | ICD-10-CM | POA: Diagnosis not present

## 2020-08-12 DIAGNOSIS — K625 Hemorrhage of anus and rectum: Secondary | ICD-10-CM | POA: Diagnosis not present

## 2020-08-12 DIAGNOSIS — I471 Supraventricular tachycardia: Secondary | ICD-10-CM | POA: Diagnosis not present

## 2020-08-12 DIAGNOSIS — K648 Other hemorrhoids: Secondary | ICD-10-CM | POA: Diagnosis not present

## 2020-08-12 DIAGNOSIS — R7303 Prediabetes: Secondary | ICD-10-CM | POA: Diagnosis not present

## 2020-08-12 DIAGNOSIS — R1310 Dysphagia, unspecified: Secondary | ICD-10-CM | POA: Diagnosis not present

## 2020-08-12 DIAGNOSIS — D120 Benign neoplasm of cecum: Secondary | ICD-10-CM | POA: Diagnosis not present

## 2020-08-12 DIAGNOSIS — Z8616 Personal history of COVID-19: Secondary | ICD-10-CM | POA: Diagnosis not present

## 2020-08-12 DIAGNOSIS — I10 Essential (primary) hypertension: Secondary | ICD-10-CM | POA: Diagnosis not present

## 2020-08-12 DIAGNOSIS — R1084 Generalized abdominal pain: Secondary | ICD-10-CM | POA: Diagnosis not present

## 2020-08-12 DIAGNOSIS — I491 Atrial premature depolarization: Secondary | ICD-10-CM | POA: Diagnosis not present

## 2020-08-12 LAB — HM COLONOSCOPY

## 2020-08-12 NOTE — Telephone Encounter (Signed)
Patient called and wanted me to let PCP know she has been in the hospital from Dec 22nd -25th and went home a day and now is back in the hospital

## 2020-08-15 DIAGNOSIS — R079 Chest pain, unspecified: Secondary | ICD-10-CM | POA: Diagnosis not present

## 2020-08-15 DIAGNOSIS — R0602 Shortness of breath: Secondary | ICD-10-CM | POA: Diagnosis not present

## 2020-08-15 DIAGNOSIS — M546 Pain in thoracic spine: Secondary | ICD-10-CM | POA: Diagnosis not present

## 2020-08-15 DIAGNOSIS — R509 Fever, unspecified: Secondary | ICD-10-CM | POA: Diagnosis not present

## 2020-08-15 DIAGNOSIS — R1310 Dysphagia, unspecified: Secondary | ICD-10-CM | POA: Diagnosis not present

## 2020-08-15 DIAGNOSIS — R490 Dysphonia: Secondary | ICD-10-CM | POA: Diagnosis not present

## 2020-08-15 DIAGNOSIS — Z20822 Contact with and (suspected) exposure to covid-19: Secondary | ICD-10-CM | POA: Diagnosis not present

## 2020-08-15 DIAGNOSIS — R9431 Abnormal electrocardiogram [ECG] [EKG]: Secondary | ICD-10-CM | POA: Diagnosis not present

## 2020-08-15 DIAGNOSIS — S2231XA Fracture of one rib, right side, initial encounter for closed fracture: Secondary | ICD-10-CM | POA: Diagnosis not present

## 2020-08-16 ENCOUNTER — Telehealth: Payer: Self-pay

## 2020-08-16 NOTE — Telephone Encounter (Signed)
Jennifer [name] states she is still having left knee pain. She is wanting an order for a knee x-ray to be sent to [facility]. She states she can't drive all the way over here. Please advise.

## 2020-08-17 DIAGNOSIS — K219 Gastro-esophageal reflux disease without esophagitis: Secondary | ICD-10-CM | POA: Diagnosis not present

## 2020-08-17 DIAGNOSIS — R1310 Dysphagia, unspecified: Secondary | ICD-10-CM | POA: Diagnosis not present

## 2020-08-17 DIAGNOSIS — J392 Other diseases of pharynx: Secondary | ICD-10-CM | POA: Diagnosis not present

## 2020-08-17 NOTE — Telephone Encounter (Signed)
I thought she was going to see sports med first.  She needs to see them first.

## 2020-08-18 NOTE — Telephone Encounter (Signed)
Jennifer [name] does not want to go to Sports Medicine.  I offered to find another Sports Medicine provider in [facility].  She doesn't want to do that either.  She has an appointment on Friday with you so I told her you two can discuss it when she is here.

## 2020-08-19 ENCOUNTER — Other Ambulatory Visit: Payer: Self-pay

## 2020-08-19 ENCOUNTER — Emergency Department (HOSPITAL_BASED_OUTPATIENT_CLINIC_OR_DEPARTMENT_OTHER)
Admission: EM | Admit: 2020-08-19 | Discharge: 2020-08-19 | Disposition: A | Discharge status: Home / Self Care | Payer: Medicare HMO | Attending: Emergency Medicine | Admitting: Emergency Medicine

## 2020-08-19 ENCOUNTER — Encounter (HOSPITAL_BASED_OUTPATIENT_CLINIC_OR_DEPARTMENT_OTHER): Payer: Self-pay

## 2020-08-19 DIAGNOSIS — M25562 Pain in left knee: Secondary | ICD-10-CM | POA: Diagnosis not present

## 2020-08-19 DIAGNOSIS — I10 Essential (primary) hypertension: Secondary | ICD-10-CM | POA: Diagnosis not present

## 2020-08-19 DIAGNOSIS — R000 Tachycardia, unspecified: Secondary | ICD-10-CM | POA: Insufficient documentation

## 2020-08-19 DIAGNOSIS — J45909 Unspecified asthma, uncomplicated: Secondary | ICD-10-CM | POA: Diagnosis not present

## 2020-08-19 DIAGNOSIS — I82612 Acute embolism and thrombosis of superficial veins of left upper extremity: Secondary | ICD-10-CM | POA: Insufficient documentation

## 2020-08-19 DIAGNOSIS — Z7951 Long term (current) use of inhaled steroids: Secondary | ICD-10-CM | POA: Diagnosis not present

## 2020-08-19 DIAGNOSIS — I808 Phlebitis and thrombophlebitis of other sites: Secondary | ICD-10-CM

## 2020-08-19 DIAGNOSIS — X58XXXA Exposure to other specified factors, initial encounter: Secondary | ICD-10-CM | POA: Diagnosis not present

## 2020-08-19 DIAGNOSIS — Z87891 Personal history of nicotine dependence: Secondary | ICD-10-CM | POA: Insufficient documentation

## 2020-08-19 DIAGNOSIS — G8929 Other chronic pain: Secondary | ICD-10-CM | POA: Diagnosis not present

## 2020-08-19 DIAGNOSIS — F419 Anxiety disorder, unspecified: Secondary | ICD-10-CM | POA: Diagnosis not present

## 2020-08-19 DIAGNOSIS — M79602 Pain in left arm: Secondary | ICD-10-CM | POA: Diagnosis present

## 2020-08-19 DIAGNOSIS — S40022A Contusion of left upper arm, initial encounter: Secondary | ICD-10-CM | POA: Diagnosis not present

## 2020-08-19 DIAGNOSIS — Y999 Unspecified external cause status: Secondary | ICD-10-CM | POA: Diagnosis not present

## 2020-08-19 NOTE — ED Notes (Signed)
ED Provider at bedside. 

## 2020-08-19 NOTE — ED Notes (Signed)
Pt here with complaints of knot with bruise on the left arm that is with pain.  Pt also fell Dec 9 and hurt right knee pain is intense to touch.

## 2020-08-19 NOTE — ED Triage Notes (Signed)
Pt arrives with c/o pain to left arm states that she has a bruise that has been there for about 2 weeks. Also c/o pain to left knee from a fall on dec. 9th. Pt reports she has been referred to sports medicine but does not want to go to the provider she was referred to .
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
## 2020-08-20 ENCOUNTER — Ambulatory Visit (INDEPENDENT_AMBULATORY_CARE_PROVIDER_SITE_OTHER): Payer: Medicare HMO

## 2020-08-20 ENCOUNTER — Encounter: Payer: Self-pay | Admitting: Family Medicine

## 2020-08-20 ENCOUNTER — Ambulatory Visit (INDEPENDENT_AMBULATORY_CARE_PROVIDER_SITE_OTHER): Payer: Medicare HMO | Admitting: Family Medicine

## 2020-08-20 VITALS — BP 147/91 | HR 109 | Temp 99.4°F | Ht 63.0 in | Wt 211.0 lb

## 2020-08-20 DIAGNOSIS — M25562 Pain in left knee: Secondary | ICD-10-CM

## 2020-08-20 DIAGNOSIS — I471 Supraventricular tachycardia: Secondary | ICD-10-CM

## 2020-08-20 DIAGNOSIS — R748 Abnormal levels of other serum enzymes: Secondary | ICD-10-CM

## 2020-08-20 DIAGNOSIS — I809 Phlebitis and thrombophlebitis of unspecified site: Secondary | ICD-10-CM

## 2020-08-20 DIAGNOSIS — W19XXXA Unspecified fall, initial encounter: Secondary | ICD-10-CM | POA: Diagnosis not present

## 2020-08-20 NOTE — Patient Instructions (Signed)
Recommend ibuprofen, 600 mg 3 times a day.  Make sure to take with food and water.  Recommendation is to take for about 5 days.  Continue to apply Ace wrap for compression over the area.  If at any point you feel like it is not healing over the next week or 2 then please let us know.  I will happy to really recheck your arm next week if you need.

## 2020-08-20 NOTE — Assessment & Plan Note (Signed)
She would really like to be able to try the propafenone but is extremely worried about potential side effects. We discussed may be coming in next Friday where she could take the medication and we could observe her for about an hour to make sure that she tolerates it well she could even try half of a tab and see how she does.
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
## 2020-08-22 ENCOUNTER — Encounter: Payer: Self-pay | Admitting: Family Medicine

## 2020-08-22 DIAGNOSIS — M79602 Pain in left arm: Secondary | ICD-10-CM

## 2020-08-22 DIAGNOSIS — I809 Phlebitis and thrombophlebitis of unspecified site: Secondary | ICD-10-CM

## 2020-08-23 ENCOUNTER — Ambulatory Visit: Payer: Medicare HMO | Admitting: Family Medicine

## 2020-08-23 DIAGNOSIS — I808 Phlebitis and thrombophlebitis of other sites: Secondary | ICD-10-CM | POA: Diagnosis not present

## 2020-08-23 DIAGNOSIS — M25512 Pain in left shoulder: Secondary | ICD-10-CM | POA: Diagnosis not present

## 2020-08-23 DIAGNOSIS — M62838 Other muscle spasm: Secondary | ICD-10-CM | POA: Diagnosis not present

## 2020-08-23 NOTE — Patient Instructions (Incomplete)
Asthma Begin Flovent 110-2 puffs twice a day with a spacer to prevent cough and wheeze. Call the clinic if you develop a rash Continue Xopenex once every 4-6 hours as needed for cough or wheeze Follow up with your pulmonologist for further evaluation and treatment  Allergic rhinitis Restart Flonase 2 sprays in each nostril once a day for a stuffy nose.  In the right nostril, point the applicator out toward the right ear. In the left nostril, point the applicator out toward the left ear Consider saline nasal rinses as needed for nasal symptoms. Use this before any medicated nasal sprays for best result  Reflux Begin taking famotidine 20 mg twice a day to control reflux. Continue dietary and lifestyle modifications Follow up with your gastroenterologist at your scheduled appointment later this month  Call the clinic if this treatment plan is not working well for you  Follow up in 2 months or sooner if needed.   

## 2020-08-23 NOTE — Progress Notes (Deleted)
[place] [id number] Dept: [phone]  FOLLOW UP NOTE  Patient ID: Jennifer [name], female    DOB: 1966/02/17  Age: 55 y.o. MRN: [id number] Date of Office Visit: 08/23/2020  Assessment  Chief Complaint: No chief complaint on file.  HPI Jennifer [name]    Drug Allergies:  Allergies  Allergen Reactions  . Azithromycin Shortness Of Breath    Lip swelling, SOB.     . Ciprofloxacin Swelling    REACTION: tongue swells  . Codeine Shortness Of Breath  . Erythromycin Base Itching and Rash  . Sulfa Antibiotics Shortness Of Breath, Rash and Other (See Comments)  . Sulfasalazine Rash and Shortness Of Breath    Other reaction(s): Other (See Comments) Other reaction(s): SHORTNESS OF BREATH  . Telmisartan Swelling    Tongue swelling, Micardis  . Ace Inhibitors Cough  . Aspirin Hives and Other (See Comments)    flushing  . Atenolol Other (See Comments)    Squeezing chest sensation  . Avelox [Moxifloxacin Hcl In Nacl] Itching       . Beta Adrenergic Blockers Other (See Comments)    Feels like chest tightening labetalol, bystolic  Feels like chest tightening "Metoprolol"   . Buspar [Buspirone] Other (See Comments)    Light headed  . Butorphanol Tartrate Other (See Comments)    Patient aggitated  . Cetirizine Hives and Rash       . Clonidine Hcl     REACTION: makes blood pressure high  . Cortisone     Feels like she is going crazy  . Erythromycin Rash  . Fentanyl Other (See Comments)    aggressive   . Fluoxetine Hcl Other (See Comments)    REACTION: headaches  . Ketorolac Tromethamine     jittery  . Lidocaine Other (See Comments)    When it involves the throat,   . Lisinopril Cough  . Metoclopramide Hcl Other (See Comments)    Dystonic reaction  . Midazolam Other (See Comments)    agitation Slow to wake up  . Montelukast Other (See Comments)    Singulair  . Montelukast Sodium Other (See Comments)    DOES NOT REMEMBER  Don't remember-told  not to take  . Naproxen Other (See Comments)    FLUSHING Pt states she took Ibuprofen today (10/08/19)  . Paroxetine Other (See Comments)    REACTION: headaches  . Penicillins Rash  . Pravastatin Other (See Comments)    Myalgias  . Promethazine Other (See Comments)    Dystonic reaction  . Promethazine Hcl Other (See Comments)    jittery  . Quinolones Swelling and Rash  . Serotonin Reuptake Inhibitors (Ssris) Other (See Comments)    Headache Effexor, prozac, zoloft,   . Sertraline Hcl     REACTION: headaches  . Stelazine [Trifluoperazine] Other (See Comments)    Dystonic reaction  . Tobramycin Itching and Rash  . Trifluoperazine Hcl     dystonic  . Atrovent Nasal Spray [Ipratropium]     Tachycardia and shaking  . Diltiazem Other (See Comments)    Chest pain  . Polyethylene Glycol 3350     Other reaction(s): Laryngeal Edema (ALLERGY)  . Propoxyphene   . Adhesive [Tape] Rash    EKG monitor patches, some tapes Blisters, rash, itching, welts.  . Butorphanol Anxiety    Patient agitated  . Ceftriaxone Rash    rocephin  . Iron Rash    Flushing with certain IV types  . Metoclopramide Itching and Other (See  Comments)    Dystonic reaction  . Metronidazole Rash  . Other Rash and Other (See Comments)    Uncoded Allergy. Allergen: steriods, Other Reaction: Not Assessed Other reaction(s): Flushing (ALLERGY/intolerance), GI Upset (intolerance), Hypertension (intolerance), Increased Heart Rate (intolerance), Mental Status Changes (intolerance), Other (See Comments), Tachycardia / Palpitations(intolerance) Hospital gowns leave a rash.   . Prednisone Anxiety and Palpitations  . Prochlorperazine Anxiety    Compazine:  Dystonic reaction  . Venlafaxine Anxiety  . Zyrtec [Cetirizine Hcl] Rash    All over body    Physical Exam: LMP 06/25/2013    Physical Exam  Diagnostics:    Assessment and Plan: No diagnosis found.  No orders of the defined types were placed in this  encounter.   There are no Patient Instructions on file for this visit.  No follow-ups on file.    Thank you for the opportunity to care for this patient.  Please do not hesitate to contact me with questions.  Gareth Morgan, FNP [facility]

## 2020-08-23 NOTE — Telephone Encounter (Signed)
Jennifer [name] called into the office stating she is still having severe pain in left arm.  She has been taking the ibuprofen and it is now having nausea.  She feels the pain is worse.  She wants to do a doppler in her left arm.  If your able to do it, please send order to [facility].  Please Advise!!

## 2020-08-23 NOTE — Telephone Encounter (Signed)
Printed order and faxed to [facility].  Patient aware order has been faxed and they will call her directly to schedule.

## 2020-08-23 NOTE — Telephone Encounter (Signed)
Order placed. P[lease fax and schedule

## 2020-08-24 ENCOUNTER — Encounter: Payer: Self-pay | Admitting: Family Medicine

## 2020-08-24 DIAGNOSIS — R490 Dysphonia: Secondary | ICD-10-CM | POA: Diagnosis not present

## 2020-08-24 DIAGNOSIS — Z8709 Personal history of other diseases of the respiratory system: Secondary | ICD-10-CM | POA: Diagnosis not present

## 2020-08-24 DIAGNOSIS — R070 Pain in throat: Secondary | ICD-10-CM | POA: Diagnosis not present

## 2020-08-24 DIAGNOSIS — R1310 Dysphagia, unspecified: Secondary | ICD-10-CM | POA: Diagnosis not present

## 2020-08-24 DIAGNOSIS — R198 Other specified symptoms and signs involving the digestive system and abdomen: Secondary | ICD-10-CM | POA: Diagnosis not present

## 2020-08-24 DIAGNOSIS — F458 Other somatoform disorders: Secondary | ICD-10-CM | POA: Diagnosis not present

## 2020-08-24 DIAGNOSIS — R1319 Other dysphagia: Secondary | ICD-10-CM | POA: Diagnosis not present

## 2020-08-26 DIAGNOSIS — M2578 Osteophyte, vertebrae: Secondary | ICD-10-CM | POA: Diagnosis not present

## 2020-08-26 DIAGNOSIS — R0602 Shortness of breath: Secondary | ICD-10-CM | POA: Diagnosis not present

## 2020-08-26 DIAGNOSIS — J4520 Mild intermittent asthma, uncomplicated: Secondary | ICD-10-CM | POA: Diagnosis not present

## 2020-08-26 DIAGNOSIS — J45909 Unspecified asthma, uncomplicated: Secondary | ICD-10-CM | POA: Diagnosis not present

## 2020-08-26 DIAGNOSIS — M546 Pain in thoracic spine: Secondary | ICD-10-CM | POA: Diagnosis not present

## 2020-08-26 DIAGNOSIS — Z8616 Personal history of COVID-19: Secondary | ICD-10-CM | POA: Diagnosis not present

## 2020-08-27 ENCOUNTER — Ambulatory Visit: Payer: Medicare HMO | Admitting: Family Medicine

## 2020-08-27 ENCOUNTER — Encounter: Payer: Self-pay | Admitting: Family Medicine

## 2020-08-27 ENCOUNTER — Ambulatory Visit (INDEPENDENT_AMBULATORY_CARE_PROVIDER_SITE_OTHER): Payer: Medicare HMO | Admitting: Family Medicine

## 2020-08-27 ENCOUNTER — Ambulatory Visit: Payer: Medicare HMO

## 2020-08-27 ENCOUNTER — Other Ambulatory Visit: Payer: Self-pay

## 2020-08-27 DIAGNOSIS — S0990XA Unspecified injury of head, initial encounter: Secondary | ICD-10-CM

## 2020-08-27 DIAGNOSIS — M542 Cervicalgia: Secondary | ICD-10-CM

## 2020-08-27 DIAGNOSIS — M62838 Other muscle spasm: Secondary | ICD-10-CM

## 2020-08-27 MED ORDER — TIZANIDINE HCL 2 MG PO TABS: 2.0000 mg | ORAL_TABLET | Freq: Four times a day (QID) | ORAL | 0 refills | Status: DC | PRN
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
## 2020-08-27 NOTE — Patient Instructions (Addendum)

## 2020-08-31 ENCOUNTER — Ambulatory Visit: Payer: Medicare HMO | Admitting: Allergy and Immunology

## 2020-08-31 DIAGNOSIS — M542 Cervicalgia: Secondary | ICD-10-CM | POA: Diagnosis not present

## 2020-08-31 DIAGNOSIS — R109 Unspecified abdominal pain: Secondary | ICD-10-CM | POA: Diagnosis not present

## 2020-08-31 DIAGNOSIS — R000 Tachycardia, unspecified: Secondary | ICD-10-CM | POA: Diagnosis not present

## 2020-08-31 DIAGNOSIS — Y9349 Activity, other involving dancing and other rhythmic movements: Secondary | ICD-10-CM | POA: Diagnosis not present

## 2020-08-31 DIAGNOSIS — Z79899 Other long term (current) drug therapy: Secondary | ICD-10-CM | POA: Diagnosis not present

## 2020-08-31 DIAGNOSIS — S0990XA Unspecified injury of head, initial encounter: Secondary | ICD-10-CM | POA: Diagnosis not present

## 2020-08-31 DIAGNOSIS — R200 Anesthesia of skin: Secondary | ICD-10-CM | POA: Diagnosis not present

## 2020-08-31 DIAGNOSIS — R110 Nausea: Secondary | ICD-10-CM | POA: Diagnosis not present

## 2020-08-31 DIAGNOSIS — S20211A Contusion of right front wall of thorax, initial encounter: Secondary | ICD-10-CM | POA: Diagnosis not present

## 2020-09-01 ENCOUNTER — Telehealth: Payer: Self-pay | Admitting: Family Medicine

## 2020-09-01 DIAGNOSIS — D509 Iron deficiency anemia, unspecified: Secondary | ICD-10-CM

## 2020-09-01 NOTE — Telephone Encounter (Signed)
Lab ordered and faxed.
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
## 2020-09-01 NOTE — Telephone Encounter (Signed)
Can you order these Tonya?

## 2020-09-02 DIAGNOSIS — Y999 Unspecified external cause status: Secondary | ICD-10-CM | POA: Diagnosis not present

## 2020-09-02 DIAGNOSIS — R079 Chest pain, unspecified: Secondary | ICD-10-CM | POA: Diagnosis not present

## 2020-09-02 DIAGNOSIS — R6884 Jaw pain: Secondary | ICD-10-CM | POA: Diagnosis not present

## 2020-09-02 DIAGNOSIS — Y92410 Unspecified street and highway as the place of occurrence of the external cause: Secondary | ICD-10-CM | POA: Diagnosis not present

## 2020-09-02 DIAGNOSIS — R970 Elevated carcinoembryonic antigen [CEA]: Secondary | ICD-10-CM | POA: Diagnosis not present

## 2020-09-02 DIAGNOSIS — R110 Nausea: Secondary | ICD-10-CM | POA: Diagnosis not present

## 2020-09-02 DIAGNOSIS — R000 Tachycardia, unspecified: Secondary | ICD-10-CM | POA: Diagnosis not present

## 2020-09-02 DIAGNOSIS — R0602 Shortness of breath: Secondary | ICD-10-CM | POA: Diagnosis not present

## 2020-09-02 DIAGNOSIS — I10 Essential (primary) hypertension: Secondary | ICD-10-CM | POA: Diagnosis not present

## 2020-09-02 DIAGNOSIS — R002 Palpitations: Secondary | ICD-10-CM | POA: Diagnosis not present

## 2020-09-02 DIAGNOSIS — M542 Cervicalgia: Secondary | ICD-10-CM | POA: Diagnosis not present

## 2020-09-02 DIAGNOSIS — R0789 Other chest pain: Secondary | ICD-10-CM | POA: Diagnosis not present

## 2020-09-02 DIAGNOSIS — R519 Headache, unspecified: Secondary | ICD-10-CM | POA: Diagnosis not present

## 2020-09-03 ENCOUNTER — Encounter: Payer: Self-pay | Admitting: Family Medicine

## 2020-09-03 ENCOUNTER — Ambulatory Visit (INDEPENDENT_AMBULATORY_CARE_PROVIDER_SITE_OTHER): Payer: Medicare HMO | Admitting: Family Medicine

## 2020-09-03 ENCOUNTER — Other Ambulatory Visit: Payer: Self-pay

## 2020-09-03 VITALS — BP 166/84 | HR 87 | Ht 63.0 in | Wt 213.0 lb

## 2020-09-03 DIAGNOSIS — H938X2 Other specified disorders of left ear: Secondary | ICD-10-CM | POA: Diagnosis not present

## 2020-09-03 DIAGNOSIS — M79602 Pain in left arm: Secondary | ICD-10-CM | POA: Diagnosis not present

## 2020-09-03 DIAGNOSIS — R0981 Nasal congestion: Secondary | ICD-10-CM

## 2020-09-03 DIAGNOSIS — D1722 Benign lipomatous neoplasm of skin and subcutaneous tissue of left arm: Secondary | ICD-10-CM | POA: Diagnosis not present

## 2020-09-03 DIAGNOSIS — R0789 Other chest pain: Secondary | ICD-10-CM

## 2020-09-03 MED ORDER — ROSUVASTATIN CALCIUM 5 MG PO TABS: 5.0000 mg | ORAL_TABLET | ORAL | 0 refills | Status: DC
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
## 2020-09-07 ENCOUNTER — Telehealth: Payer: Self-pay | Admitting: Family Medicine

## 2020-09-07 DIAGNOSIS — R9431 Abnormal electrocardiogram [ECG] [EKG]: Secondary | ICD-10-CM | POA: Diagnosis not present

## 2020-09-07 DIAGNOSIS — Z87891 Personal history of nicotine dependence: Secondary | ICD-10-CM | POA: Diagnosis not present

## 2020-09-07 DIAGNOSIS — I10 Essential (primary) hypertension: Secondary | ICD-10-CM | POA: Diagnosis not present

## 2020-09-07 DIAGNOSIS — R000 Tachycardia, unspecified: Secondary | ICD-10-CM | POA: Diagnosis not present

## 2020-09-07 DIAGNOSIS — Z79899 Other long term (current) drug therapy: Secondary | ICD-10-CM | POA: Diagnosis not present

## 2020-09-07 DIAGNOSIS — R519 Headache, unspecified: Secondary | ICD-10-CM | POA: Diagnosis not present

## 2020-09-07 DIAGNOSIS — H532 Diplopia: Secondary | ICD-10-CM | POA: Diagnosis not present

## 2020-09-07 LAB — IRON,TIBC AND FERRITIN PANEL
%SAT: 14
Iron: 49
TIBC: 358

## 2020-09-07 LAB — BASIC METABOLIC PANEL
BUN: 14 (ref 4–21)
CO2: 24 — AB (ref 13–22)
Chloride: 108 (ref 99–108)
Creatinine: 0.7 (ref 0.5–1.1)
Glucose: 150
Potassium: 3.7 (ref 3.4–5.3)
Sodium: 140 (ref 137–147)

## 2020-09-07 LAB — COMPREHENSIVE METABOLIC PANEL: Calcium: 8.8 (ref 8.7–10.7)

## 2020-09-07 NOTE — Telephone Encounter (Signed)
Call pt: iron is low recommend start a daly iron supplement. Then recheck in 8-12 weeks.

## 2020-09-08 ENCOUNTER — Telehealth: Payer: Self-pay | Admitting: Allergy and Immunology

## 2020-09-08 NOTE — Telephone Encounter (Signed)
Pt states  she is having shortness of breath, wheezing, lungs feel tight.no fever, taking all prescribed meds.  Does not want to go to ER prefers an in office appt. Would like a call back from a nurse.

## 2020-09-09 ENCOUNTER — Telehealth: Payer: Self-pay

## 2020-09-09 DIAGNOSIS — Z6839 Body mass index (BMI) 39.0-39.9, adult: Secondary | ICD-10-CM | POA: Diagnosis not present

## 2020-09-09 DIAGNOSIS — J4520 Mild intermittent asthma, uncomplicated: Secondary | ICD-10-CM | POA: Diagnosis not present

## 2020-09-09 DIAGNOSIS — J3089 Other allergic rhinitis: Secondary | ICD-10-CM | POA: Diagnosis not present

## 2020-09-09 NOTE — Telephone Encounter (Signed)
Pt is awaiting a call back from a nurse.

## 2020-09-09 NOTE — Telephone Encounter (Signed)
Spoke to Jennifer [name] she was unable to get into our office today to be seen, she called her pulmonologist and was worked in today in the  [place] office. Pt. States she saw a FNP and was given a breo sample will call back tomorrow to give dosage. Pt. Was in the grocery store shopping. Instructed pt. To call their office if the breo isn't helping. Told pt. Also to call our office for a follow up appointment with our physicians.

## 2020-09-16 ENCOUNTER — Telehealth: Payer: Self-pay | Admitting: *Deleted

## 2020-09-16 ENCOUNTER — Telehealth (INDEPENDENT_AMBULATORY_CARE_PROVIDER_SITE_OTHER): Payer: Medicare HMO | Admitting: Family Medicine

## 2020-09-16 VITALS — BP 155/97

## 2020-09-16 DIAGNOSIS — M549 Dorsalgia, unspecified: Secondary | ICD-10-CM | POA: Diagnosis not present

## 2020-09-16 DIAGNOSIS — M25562 Pain in left knee: Secondary | ICD-10-CM | POA: Diagnosis not present

## 2020-09-16 DIAGNOSIS — S2231XA Fracture of one rib, right side, initial encounter for closed fracture: Secondary | ICD-10-CM | POA: Diagnosis not present

## 2020-09-16 DIAGNOSIS — R110 Nausea: Secondary | ICD-10-CM | POA: Diagnosis not present

## 2020-09-16 DIAGNOSIS — M546 Pain in thoracic spine: Secondary | ICD-10-CM | POA: Diagnosis not present

## 2020-09-16 DIAGNOSIS — I10 Essential (primary) hypertension: Secondary | ICD-10-CM

## 2020-09-16 DIAGNOSIS — R079 Chest pain, unspecified: Secondary | ICD-10-CM | POA: Diagnosis not present

## 2020-09-16 DIAGNOSIS — Z20822 Contact with and (suspected) exposure to covid-19: Secondary | ICD-10-CM

## 2020-09-16 DIAGNOSIS — I498 Other specified cardiac arrhythmias: Secondary | ICD-10-CM | POA: Diagnosis not present

## 2020-09-16 DIAGNOSIS — M79662 Pain in left lower leg: Secondary | ICD-10-CM | POA: Diagnosis not present
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
## 2020-09-16 NOTE — Telephone Encounter (Signed)
Wanted Covid labs ordered. Faxed orders to [facility].

## 2020-09-20 ENCOUNTER — Ambulatory Visit: Payer: Medicare HMO | Admitting: Family Medicine

## 2020-09-20 DIAGNOSIS — R6889 Other general symptoms and signs: Secondary | ICD-10-CM | POA: Diagnosis not present

## 2020-09-20 DIAGNOSIS — M542 Cervicalgia: Secondary | ICD-10-CM | POA: Diagnosis not present

## 2020-09-20 DIAGNOSIS — H532 Diplopia: Secondary | ICD-10-CM | POA: Diagnosis not present

## 2020-09-20 DIAGNOSIS — G4452 New daily persistent headache (NDPH): Secondary | ICD-10-CM | POA: Diagnosis not present

## 2020-09-20 NOTE — Assessment & Plan Note (Signed)
Really like to see her get back at least on the beta-blocker which did help some.  But I know she is working on transitioning to a new medication with her cardiologist.  In fact she had come in here once to be able to take the medication so that we could observe her make sure she did have a reaction.  I think she is still planning on rescheduling that.

## 2020-09-21 DIAGNOSIS — R0602 Shortness of breath: Secondary | ICD-10-CM | POA: Diagnosis not present

## 2020-09-21 DIAGNOSIS — I498 Other specified cardiac arrhythmias: Secondary | ICD-10-CM | POA: Diagnosis not present

## 2020-09-21 DIAGNOSIS — I10 Essential (primary) hypertension: Secondary | ICD-10-CM | POA: Diagnosis not present

## 2020-09-21 DIAGNOSIS — R1010 Upper abdominal pain, unspecified: Secondary | ICD-10-CM | POA: Diagnosis not present

## 2020-09-21 DIAGNOSIS — R002 Palpitations: Secondary | ICD-10-CM | POA: Diagnosis not present

## 2020-09-21 DIAGNOSIS — R0789 Other chest pain: Secondary | ICD-10-CM | POA: Diagnosis not present

## 2020-09-21 DIAGNOSIS — Z79899 Other long term (current) drug therapy: Secondary | ICD-10-CM | POA: Diagnosis not present

## 2020-09-21 DIAGNOSIS — M79602 Pain in left arm: Secondary | ICD-10-CM | POA: Diagnosis not present

## 2020-09-23 NOTE — Telephone Encounter (Signed)
Patient has been informed at her visit to start iron.

## 2020-09-24 ENCOUNTER — Encounter: Payer: Self-pay | Admitting: Family Medicine

## 2020-09-24 ENCOUNTER — Other Ambulatory Visit: Payer: Self-pay

## 2020-09-24 ENCOUNTER — Ambulatory Visit (INDEPENDENT_AMBULATORY_CARE_PROVIDER_SITE_OTHER): Payer: Medicare HMO | Admitting: Family Medicine

## 2020-09-24 VITALS — BP 157/82 | HR 99 | Ht 63.0 in | Wt 215.0 lb

## 2020-09-24 DIAGNOSIS — E876 Hypokalemia: Secondary | ICD-10-CM

## 2020-09-24 DIAGNOSIS — I10 Essential (primary) hypertension: Secondary | ICD-10-CM | POA: Diagnosis not present

## 2020-09-24 DIAGNOSIS — R0981 Nasal congestion: Secondary | ICD-10-CM | POA: Diagnosis not present

## 2020-09-24 DIAGNOSIS — M79605 Pain in left leg: Secondary | ICD-10-CM | POA: Diagnosis not present

## 2020-09-24 MED ORDER — HYDROCHLOROTHIAZIDE 12.5 MG PO CAPS: 12.5000 mg | ORAL_CAPSULE | Freq: Every day | ORAL | 1 refills | Status: DC

## 2020-09-24 MED ORDER — POTASSIUM CHLORIDE 40 MEQ/15ML (20%) PO SOLN: 7.5000 mL | Freq: Every day | ORAL | 1 refills | Status: DC
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
## 2020-09-25 DIAGNOSIS — R0689 Other abnormalities of breathing: Secondary | ICD-10-CM | POA: Diagnosis not present

## 2020-09-25 DIAGNOSIS — I10 Essential (primary) hypertension: Secondary | ICD-10-CM | POA: Diagnosis not present

## 2020-09-25 DIAGNOSIS — R42 Dizziness and giddiness: Secondary | ICD-10-CM | POA: Diagnosis not present

## 2020-09-25 DIAGNOSIS — F419 Anxiety disorder, unspecified: Secondary | ICD-10-CM | POA: Diagnosis not present

## 2020-09-25 DIAGNOSIS — E876 Hypokalemia: Secondary | ICD-10-CM | POA: Diagnosis not present

## 2020-09-25 DIAGNOSIS — R000 Tachycardia, unspecified: Secondary | ICD-10-CM | POA: Diagnosis not present

## 2020-09-25 DIAGNOSIS — J80 Acute respiratory distress syndrome: Secondary | ICD-10-CM | POA: Diagnosis not present

## 2020-09-25 DIAGNOSIS — R0602 Shortness of breath: Secondary | ICD-10-CM | POA: Diagnosis not present

## 2020-09-25 DIAGNOSIS — S2231XA Fracture of one rib, right side, initial encounter for closed fracture: Secondary | ICD-10-CM | POA: Diagnosis not present

## 2020-09-25 DIAGNOSIS — R739 Hyperglycemia, unspecified: Secondary | ICD-10-CM | POA: Diagnosis not present

## 2020-09-25 DIAGNOSIS — R002 Palpitations: Secondary | ICD-10-CM | POA: Diagnosis not present

## 2020-09-26 DIAGNOSIS — R000 Tachycardia, unspecified: Secondary | ICD-10-CM | POA: Diagnosis not present

## 2020-09-26 DIAGNOSIS — I517 Cardiomegaly: Secondary | ICD-10-CM | POA: Diagnosis not present

## 2020-09-27 ENCOUNTER — Ambulatory Visit (INDEPENDENT_AMBULATORY_CARE_PROVIDER_SITE_OTHER): Payer: Medicare HMO | Admitting: Allergy & Immunology

## 2020-09-27 ENCOUNTER — Other Ambulatory Visit: Payer: Self-pay

## 2020-09-27 ENCOUNTER — Telehealth: Payer: Self-pay | Admitting: Family Medicine

## 2020-09-27 VITALS — BP 170/90 | HR 110 | Temp 98.2°F | Resp 18

## 2020-09-27 DIAGNOSIS — R0602 Shortness of breath: Secondary | ICD-10-CM | POA: Diagnosis not present

## 2020-09-27 DIAGNOSIS — R0981 Nasal congestion: Secondary | ICD-10-CM | POA: Diagnosis not present

## 2020-09-27 DIAGNOSIS — J4540 Moderate persistent asthma, uncomplicated: Secondary | ICD-10-CM | POA: Diagnosis not present

## 2020-09-27 DIAGNOSIS — K219 Gastro-esophageal reflux disease without esophagitis: Secondary | ICD-10-CM

## 2020-09-27 DIAGNOSIS — R062 Wheezing: Secondary | ICD-10-CM | POA: Diagnosis not present

## 2020-09-27 DIAGNOSIS — F418 Other specified anxiety disorders: Secondary | ICD-10-CM

## 2020-09-27 DIAGNOSIS — I10 Essential (primary) hypertension: Secondary | ICD-10-CM | POA: Diagnosis not present

## 2020-09-27 DIAGNOSIS — I517 Cardiomegaly: Secondary | ICD-10-CM | POA: Diagnosis not present

## 2020-09-27 DIAGNOSIS — F411 Generalized anxiety disorder: Secondary | ICD-10-CM

## 2020-09-27 DIAGNOSIS — R000 Tachycardia, unspecified: Secondary | ICD-10-CM | POA: Diagnosis not present

## 2020-09-27 DIAGNOSIS — J383 Other diseases of vocal cords: Secondary | ICD-10-CM | POA: Diagnosis not present

## 2020-09-27 DIAGNOSIS — T7800XD Anaphylactic reaction due to unspecified food, subsequent encounter: Secondary | ICD-10-CM | POA: Diagnosis not present

## 2020-09-27 DIAGNOSIS — J4520 Mild intermittent asthma, uncomplicated: Secondary | ICD-10-CM | POA: Diagnosis not present

## 2020-09-27 DIAGNOSIS — F339 Major depressive disorder, recurrent, unspecified: Secondary | ICD-10-CM

## 2020-09-27 DIAGNOSIS — Z8709 Personal history of other diseases of the respiratory system: Secondary | ICD-10-CM | POA: Diagnosis not present

## 2020-09-27 DIAGNOSIS — R059 Cough, unspecified: Secondary | ICD-10-CM | POA: Diagnosis not present

## 2020-09-27 DIAGNOSIS — R5383 Other fatigue: Secondary | ICD-10-CM | POA: Diagnosis not present

## 2020-09-27 DIAGNOSIS — J3089 Other allergic rhinitis: Secondary | ICD-10-CM

## 2020-09-27 DIAGNOSIS — R0989 Other specified symptoms and signs involving the circulatory and respiratory systems: Secondary | ICD-10-CM | POA: Diagnosis not present
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
## 2020-09-27 NOTE — Patient Instructions (Addendum)
1. Moderate persistent asthma, uncomplicated - Lung testing looks PERFECT!  - We need a controller medication to control your breathing. - Add on Pulmicort 0.25 mg twice daily via the nebulizer and Perforomist twice daily via nebulizer. - These will help loosen your lungs up and help you to breathe better. - Keep your appointment with [facility]monology.  - Call us with updates.   2. Chronic rhinitis - Continue with nasal saline rinses.  3. Return in about 3 months (around 12/25/2020).   Please inform us of any Emergency Department visits, hospitalizations, or changes in symptoms. Call us before going to the ED for breathing or allergy symptoms since we might be able to fit you in for a sick visit. Feel free to contact us anytime with any questions, problems, or concerns.  It was a pleasure to meet you today!  Websites that have reliable patient information: 1. American Academy of Asthma, Allergy, and Immunology: www.aaaai.org 2. Food Allergy Research and Education (FARE): foodallergy.org 3. Mothers of Asthmatics: http://www.asthmacommunitynetwork.org 4. American College of Allergy, Asthma, and Immunology: www.acaai.org   COVID-19 Vaccine Information can be found at: ShippingScam.co.uk For questions related to vaccine distribution or appointments, please email [email or web address] or call [phone].   We realize that you might be concerned about having an allergic reaction to the COVID19 vaccines. To help with that concern, WE ARE OFFERING THE COVID19 VACCINES IN OUR OFFICE! Ask the front desk for dates!     "Like" [place] on Facebook and Instagram for our latest updates!       Make sure you are registered to vote! If you have moved or changed any of your contact information, you will need to get this updated before voting!  In some cases, you MAY be able to register to vote online:  [email or web address]

## 2020-09-27 NOTE — Telephone Encounter (Signed)
Referral placed for [facility] in [place]

## 2020-09-27 NOTE — Telephone Encounter (Signed)
Jennifer [name] called and was tearful and states that she feels like she is having a break-down again and needs a referral to [facility] and states last time she talked to Dr. Madilyn Fireman she had talked about getting her in with someone soon. Jennifer [name] wants to know if this can be done as soon as possible.

## 2020-09-28 ENCOUNTER — Telehealth: Payer: Self-pay | Admitting: Family Medicine

## 2020-09-28 ENCOUNTER — Encounter: Payer: Self-pay | Admitting: Family Medicine

## 2020-09-28 ENCOUNTER — Telehealth: Payer: Self-pay | Admitting: Family

## 2020-09-28 MED ORDER — BUDESONIDE 0.25 MG/2ML IN SUSP: 0.2500 mg | Freq: Two times a day (BID) | RESPIRATORY_TRACT | 5 refills | Status: DC

## 2020-09-28 MED ORDER — LEVALBUTEROL HCL 1.25 MG/3ML IN NEBU: 1.2500 mg | INHALATION_SOLUTION | RESPIRATORY_TRACT | 1 refills | Status: DC | PRN

## 2020-09-28 MED ORDER — FORMOTEROL FUMARATE 20 MCG/2ML IN NEBU: 20.0000 ug | INHALATION_SOLUTION | Freq: Two times a day (BID) | RESPIRATORY_TRACT | 5 refills | Status: DC

## 2020-09-28 NOTE — Telephone Encounter (Signed)
Patients states alternate medication sent in is too high in cost patient asking for another alternate to be selected please advise

## 2020-09-28 NOTE — Assessment & Plan Note (Addendum)
BP uncontrolled.  She is willing to try hydrochlorothiazide in place of the spironolactone but reminded her she will need to take it every day so that we can titrate her potassium.  She is going to hold the spironolactone.

## 2020-09-28 NOTE — Assessment & Plan Note (Signed)
Sent over new prescription for potassium liquid to see if the insurance will cover it.  She will need to take the HCTZ very consistently and then recheck potassium in about a week so that we can make adjustments to her potassium if needed.

## 2020-09-28 NOTE — Telephone Encounter (Signed)
I see she just saw allergist yesterday.  They recommend nasal rinse.  Does she feel she needs antibiotics?

## 2020-09-28 NOTE — Telephone Encounter (Signed)
Patient called and states that she mentioned to [facility] last week that her sinus's are bothering her, she feels like she needs something called in for this today and she also has a virtual appt tomorrow.

## 2020-09-28 NOTE — Telephone Encounter (Signed)
Pt waiting on prescriptions that were to be sent in yesterday and also asking for xopenex refill for nebulizer.

## 2020-09-28 NOTE — Telephone Encounter (Signed)
Prescriptions was sent in at [facility] yesterday. They are being signed by him today. Xopenex 1.25 mg sent to [facility] per Althea Charon, FNP.

## 2020-09-29 ENCOUNTER — Telehealth (INDEPENDENT_AMBULATORY_CARE_PROVIDER_SITE_OTHER): Payer: Medicare HMO | Admitting: Family Medicine

## 2020-09-29 ENCOUNTER — Encounter: Payer: Self-pay | Admitting: Allergy & Immunology

## 2020-09-29 ENCOUNTER — Encounter: Payer: Self-pay | Admitting: Family Medicine

## 2020-09-29 ENCOUNTER — Other Ambulatory Visit: Payer: Self-pay

## 2020-09-29 VITALS — HR 94

## 2020-09-29 DIAGNOSIS — S2231XA Fracture of one rib, right side, initial encounter for closed fracture: Secondary | ICD-10-CM | POA: Diagnosis not present

## 2020-09-29 DIAGNOSIS — R079 Chest pain, unspecified: Secondary | ICD-10-CM | POA: Diagnosis not present

## 2020-09-29 DIAGNOSIS — M79605 Pain in left leg: Secondary | ICD-10-CM

## 2020-09-29 DIAGNOSIS — R112 Nausea with vomiting, unspecified: Secondary | ICD-10-CM | POA: Diagnosis not present

## 2020-09-29 DIAGNOSIS — U071 COVID-19: Secondary | ICD-10-CM

## 2020-09-29 DIAGNOSIS — R1110 Vomiting, unspecified: Secondary | ICD-10-CM | POA: Diagnosis not present

## 2020-09-29 DIAGNOSIS — R110 Nausea: Secondary | ICD-10-CM | POA: Diagnosis not present

## 2020-09-29 DIAGNOSIS — Z8616 Personal history of COVID-19: Secondary | ICD-10-CM | POA: Diagnosis not present

## 2020-09-29 NOTE — Telephone Encounter (Signed)
levalalbuterol is going to be $5 and some odd cent  perforomist is not covered need pa will work on pa today pt does have samples for it to last until we get results of pa  pulmicort is $20.02

## 2020-09-29 NOTE — Progress Notes (Signed)
She went to the ED on Saturday. She reports that her O2% has been around 93%. She has a cough, headache,congestion, and nausea.  She has been taking tylenol. She hasn't tried any of the nebulizer treatments.
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
## 2020-09-29 NOTE — Telephone Encounter (Signed)
Pt has appointment at 8:30 this morning.

## 2020-09-30 IMAGING — DX DG CHEST 1V PORT
1 series · 1 of 1 positions shown · non-contrast
Comparison: 05/07/2019, 06/11/2019

CLINICAL DATA: Cough

EXAM:
PORTABLE CHEST 1 VIEW

[chest ap]
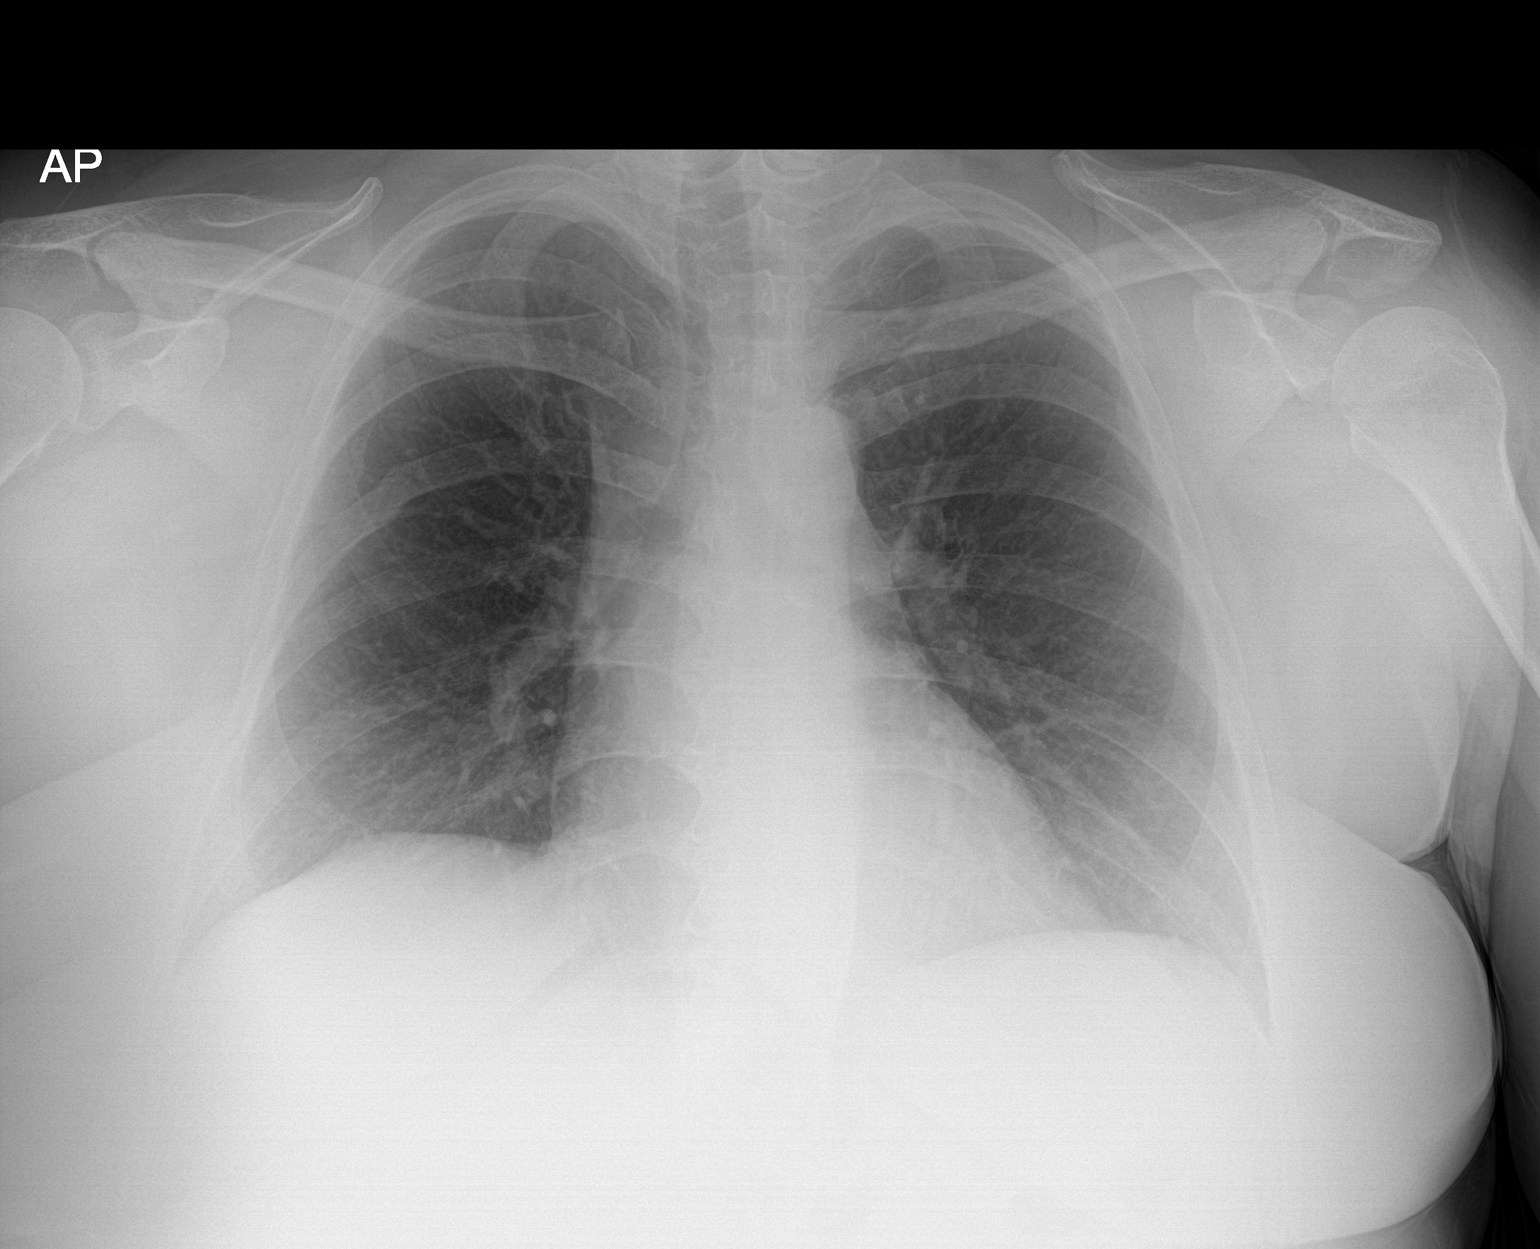

[1 of 1 positions shown; findings below may reference images not displayed]

FINDINGS: The heart size and mediastinal contours are within normal limits.
Both lungs are clear. The visualized skeletal structures are
unremarkable.
IMPRESSION: No active disease.

## 2020-10-01 DIAGNOSIS — Z209 Contact with and (suspected) exposure to unspecified communicable disease: Secondary | ICD-10-CM | POA: Diagnosis not present

## 2020-10-01 DIAGNOSIS — R0689 Other abnormalities of breathing: Secondary | ICD-10-CM | POA: Diagnosis not present

## 2020-10-01 DIAGNOSIS — Z8616 Personal history of COVID-19: Secondary | ICD-10-CM | POA: Diagnosis not present

## 2020-10-01 DIAGNOSIS — U071 COVID-19: Secondary | ICD-10-CM | POA: Diagnosis not present

## 2020-10-01 DIAGNOSIS — R52 Pain, unspecified: Secondary | ICD-10-CM | POA: Diagnosis not present

## 2020-10-01 DIAGNOSIS — I10 Essential (primary) hypertension: Secondary | ICD-10-CM | POA: Diagnosis not present

## 2020-10-01 NOTE — Progress Notes (Signed)
Patient is scheduled next month to see a provider at [facility] Caprice Renshaw, Jairo Ben, FNP)

## 2020-10-03 DIAGNOSIS — R0789 Other chest pain: Secondary | ICD-10-CM | POA: Diagnosis not present

## 2020-10-03 DIAGNOSIS — J45909 Unspecified asthma, uncomplicated: Secondary | ICD-10-CM | POA: Diagnosis not present

## 2020-10-03 DIAGNOSIS — Z87891 Personal history of nicotine dependence: Secondary | ICD-10-CM | POA: Diagnosis not present

## 2020-10-03 DIAGNOSIS — I10 Essential (primary) hypertension: Secondary | ICD-10-CM | POA: Diagnosis not present

## 2020-10-03 DIAGNOSIS — R9431 Abnormal electrocardiogram [ECG] [EKG]: Secondary | ICD-10-CM | POA: Diagnosis not present

## 2020-10-03 DIAGNOSIS — I471 Supraventricular tachycardia: Secondary | ICD-10-CM | POA: Diagnosis not present

## 2020-10-03 DIAGNOSIS — G35 Multiple sclerosis: Secondary | ICD-10-CM | POA: Diagnosis not present

## 2020-10-03 DIAGNOSIS — U071 COVID-19: Secondary | ICD-10-CM | POA: Diagnosis not present

## 2020-10-03 DIAGNOSIS — Z8589 Personal history of malignant neoplasm of other organs and systems: Secondary | ICD-10-CM | POA: Diagnosis not present

## 2020-10-03 DIAGNOSIS — R059 Cough, unspecified: Secondary | ICD-10-CM | POA: Diagnosis not present

## 2020-10-03 DIAGNOSIS — K219 Gastro-esophageal reflux disease without esophagitis: Secondary | ICD-10-CM | POA: Diagnosis not present

## 2020-10-03 DIAGNOSIS — E785 Hyperlipidemia, unspecified: Secondary | ICD-10-CM | POA: Diagnosis not present

## 2020-10-03 DIAGNOSIS — J028 Acute pharyngitis due to other specified organisms: Secondary | ICD-10-CM | POA: Diagnosis not present

## 2020-10-04 ENCOUNTER — Telehealth: Payer: Self-pay | Admitting: Family Medicine

## 2020-10-04 ENCOUNTER — Ambulatory Visit: Payer: Medicare HMO | Admitting: Family Medicine

## 2020-10-04 DIAGNOSIS — R9431 Abnormal electrocardiogram [ECG] [EKG]: Secondary | ICD-10-CM | POA: Diagnosis not present

## 2020-10-04 NOTE — Telephone Encounter (Signed)
Jennifer [name],  Patient called very being [facility] today and I had to let her know Dr.Metheney did not have any openings today. She is scheduled with PCP on this Friday. Patient did not want her Counseling referral sent to [facility]. Patient was wanting someone in [facility] but not [facility] location. Please resend referral to a counseling services in [facility] outside of [place]

## 2020-10-05 DIAGNOSIS — I498 Other specified cardiac arrhythmias: Secondary | ICD-10-CM | POA: Diagnosis not present

## 2020-10-05 DIAGNOSIS — E1169 Type 2 diabetes mellitus with other specified complication: Secondary | ICD-10-CM | POA: Diagnosis not present

## 2020-10-05 DIAGNOSIS — E785 Hyperlipidemia, unspecified: Secondary | ICD-10-CM | POA: Diagnosis not present

## 2020-10-05 DIAGNOSIS — R9431 Abnormal electrocardiogram [ECG] [EKG]: Secondary | ICD-10-CM | POA: Diagnosis not present

## 2020-10-05 DIAGNOSIS — F419 Anxiety disorder, unspecified: Secondary | ICD-10-CM | POA: Diagnosis not present

## 2020-10-05 DIAGNOSIS — U071 COVID-19: Secondary | ICD-10-CM | POA: Diagnosis not present

## 2020-10-05 DIAGNOSIS — J029 Acute pharyngitis, unspecified: Secondary | ICD-10-CM | POA: Diagnosis not present

## 2020-10-05 DIAGNOSIS — J028 Acute pharyngitis due to other specified organisms: Secondary | ICD-10-CM | POA: Diagnosis not present

## 2020-10-05 DIAGNOSIS — Z8542 Personal history of malignant neoplasm of other parts of uterus: Secondary | ICD-10-CM | POA: Diagnosis not present

## 2020-10-05 DIAGNOSIS — B9729 Other coronavirus as the cause of diseases classified elsewhere: Secondary | ICD-10-CM | POA: Diagnosis not present

## 2020-10-05 NOTE — Telephone Encounter (Signed)
I redid referral and sent to [facility] they will call and schedule with patient - CF

## 2020-10-06 ENCOUNTER — Encounter: Payer: Self-pay | Admitting: Family Medicine

## 2020-10-06 DIAGNOSIS — R4702 Dysphasia: Secondary | ICD-10-CM | POA: Diagnosis not present

## 2020-10-06 DIAGNOSIS — Z8739 Personal history of other diseases of the musculoskeletal system and connective tissue: Secondary | ICD-10-CM | POA: Diagnosis not present

## 2020-10-06 DIAGNOSIS — U071 COVID-19: Secondary | ICD-10-CM | POA: Diagnosis not present

## 2020-10-06 DIAGNOSIS — R221 Localized swelling, mass and lump, neck: Secondary | ICD-10-CM | POA: Diagnosis not present

## 2020-10-06 DIAGNOSIS — Z87891 Personal history of nicotine dependence: Secondary | ICD-10-CM | POA: Diagnosis not present

## 2020-10-06 DIAGNOSIS — R1310 Dysphagia, unspecified: Secondary | ICD-10-CM | POA: Diagnosis not present

## 2020-10-06 DIAGNOSIS — J029 Acute pharyngitis, unspecified: Secondary | ICD-10-CM | POA: Diagnosis not present

## 2020-10-08 ENCOUNTER — Ambulatory Visit (INDEPENDENT_AMBULATORY_CARE_PROVIDER_SITE_OTHER): Payer: Medicare HMO | Admitting: Family Medicine

## 2020-10-08 ENCOUNTER — Other Ambulatory Visit: Payer: Self-pay

## 2020-10-08 VITALS — BP 168/77 | HR 98 | Ht 63.0 in | Wt 215.0 lb

## 2020-10-08 DIAGNOSIS — R229 Localized swelling, mass and lump, unspecified: Secondary | ICD-10-CM | POA: Diagnosis not present

## 2020-10-08 DIAGNOSIS — M25512 Pain in left shoulder: Secondary | ICD-10-CM | POA: Diagnosis not present

## 2020-10-08 DIAGNOSIS — I10 Essential (primary) hypertension: Secondary | ICD-10-CM | POA: Diagnosis not present

## 2020-10-08 DIAGNOSIS — R0989 Other specified symptoms and signs involving the circulatory and respiratory systems: Secondary | ICD-10-CM | POA: Diagnosis not present

## 2020-10-08 DIAGNOSIS — F411 Generalized anxiety disorder: Secondary | ICD-10-CM

## 2020-10-08 MED ORDER — ESCITALOPRAM OXALATE 5 MG PO TABS: 5.0000 mg | ORAL_TABLET | Freq: Every day | ORAL | 0 refills | Status: DC

## 2020-10-08 MED ORDER — DIAZEPAM 2 MG PO TABS: 2.0000 mg | ORAL_TABLET | Freq: Every day | ORAL | 0 refills | Status: DC | PRN
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
## 2020-10-11 ENCOUNTER — Telehealth: Payer: Self-pay

## 2020-10-11 ENCOUNTER — Telehealth: Payer: Self-pay | Admitting: Family Medicine

## 2020-10-11 DIAGNOSIS — J0100 Acute maxillary sinusitis, unspecified: Secondary | ICD-10-CM

## 2020-10-11 DIAGNOSIS — R229 Localized swelling, mass and lump, unspecified: Secondary | ICD-10-CM

## 2020-10-11 IMAGING — MR MR CERVICAL SPINE W/O CM
4 of 5 series · 29 of 48 positions shown · non-contrast
Comparison: Prior MRI from 10/08/2016.

CLINICAL DATA: Initial evaluation for chronic neck pain, not
improving with conservative measures. History of degenerative disc
disease.

EXAM:
MRI CERVICAL SPINE WITHOUT CONTRAST
TECHNIQUE: Multiplanar, multisequence MR imaging of the cervical spine was
performed. No intravenous contrast was administered.

[Series 2: (id) tse sag · sagittal · 3.0mm · 0.41mm/px · 6 of 13 slices shown]
[im 1/13]
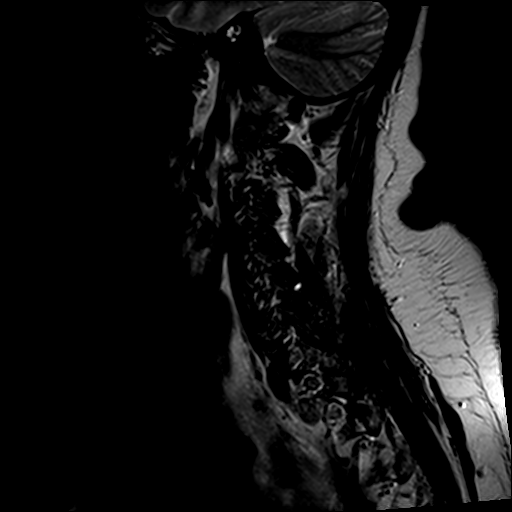
[im 3/13]
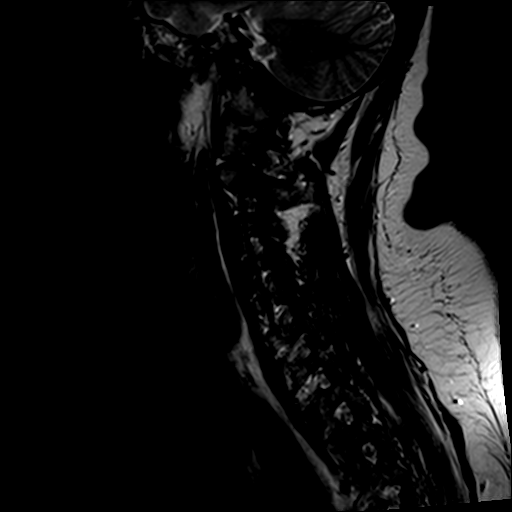
[im 5/13]
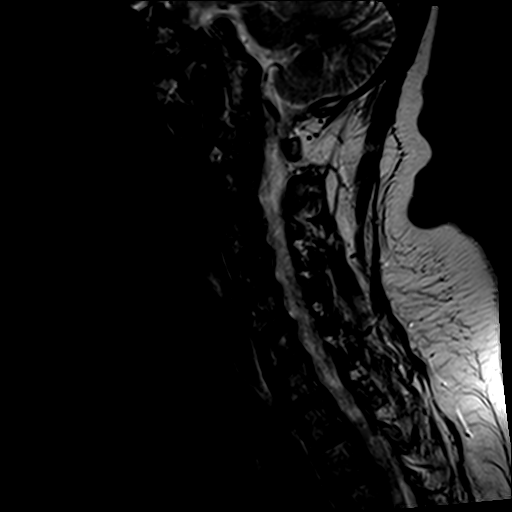
[im 8/13]
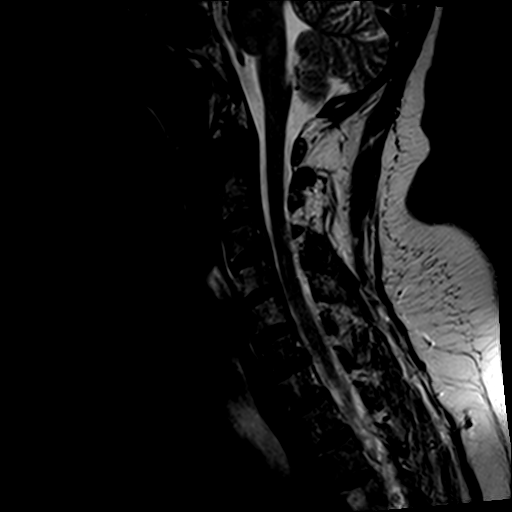
[im 10/13]
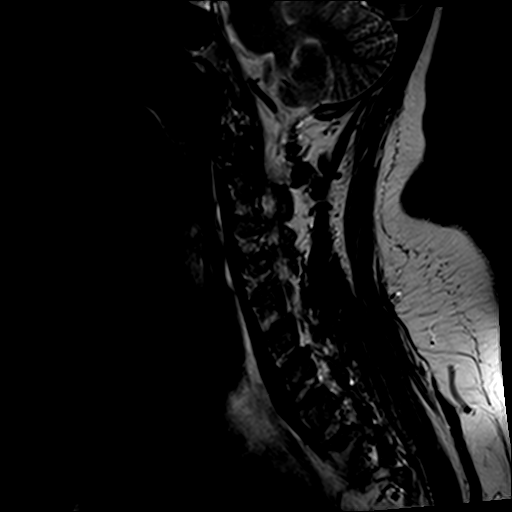
[im 13/13]
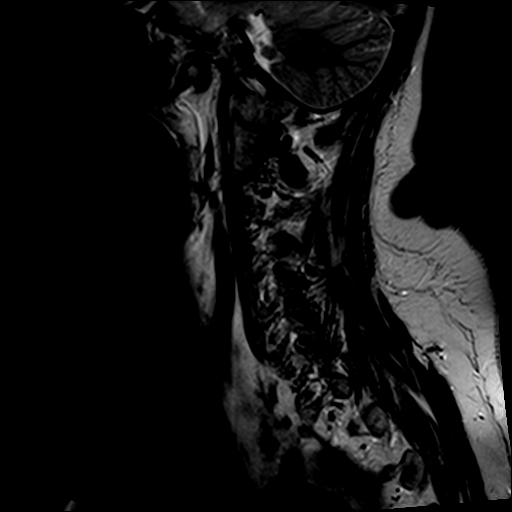

[Series 4: STIR · sagittal · 3.0mm · 0.82mm/px · 7 of 13 slices shown]
[im 1/13]
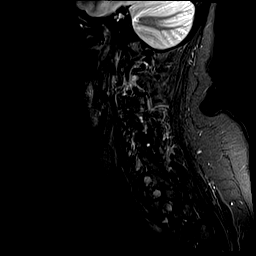
[im 3/13]
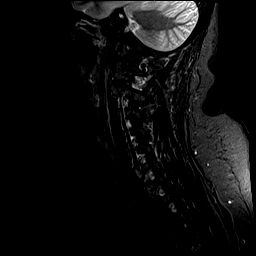
[im 5/13]
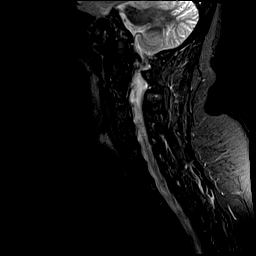
[im 7/13]
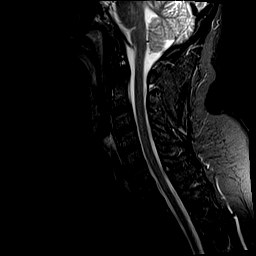
[im 9/13]
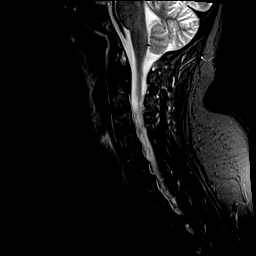
[im 11/13]
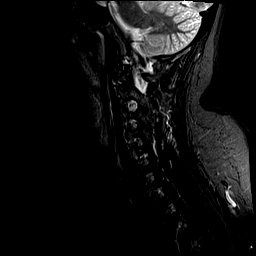
[im 13/13]
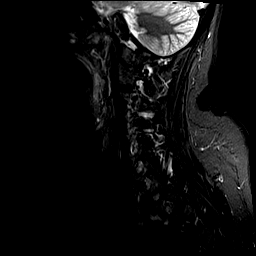

[Series 5: T2 · axial · 3.0mm · 0.39mm/px · z∈[-74,+19]mm · 8 of 28 slices shown (1 of 2)]
[im 1/28]
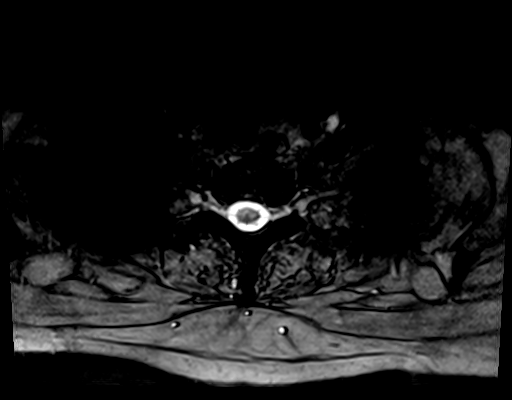
[im 5/28]
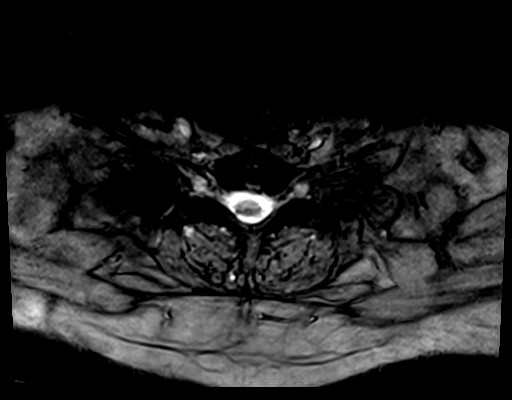
[im 9/28]
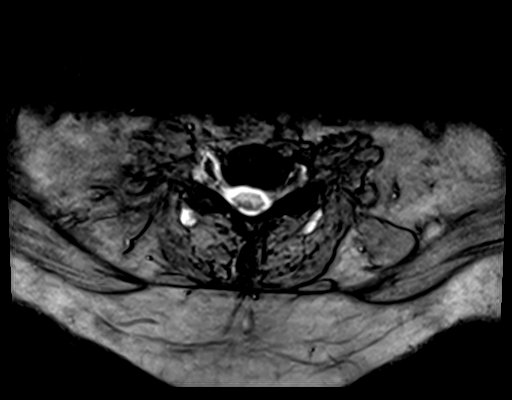
[im 13/28]
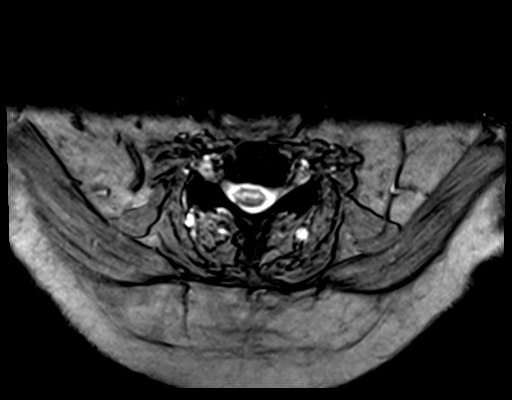
[im 15/28]
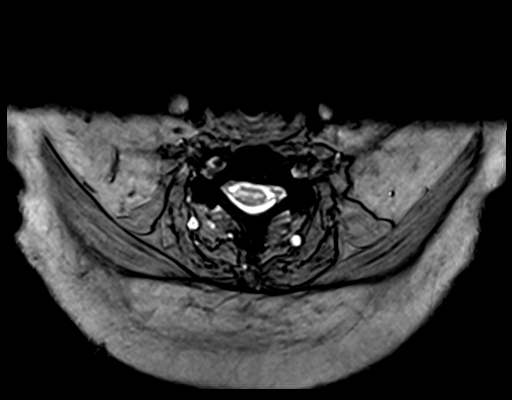
[im 19/28]
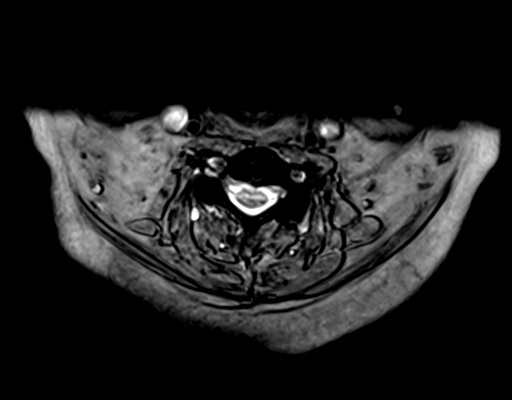
[im 23/28]
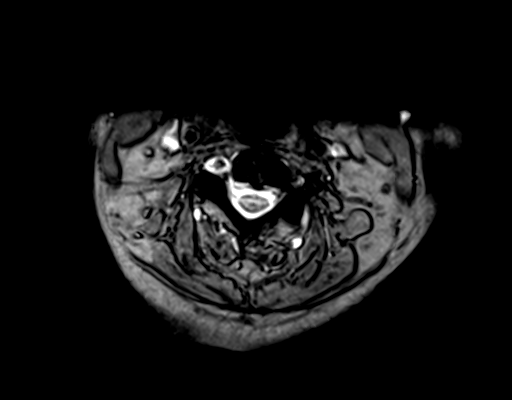
[im 28/28]
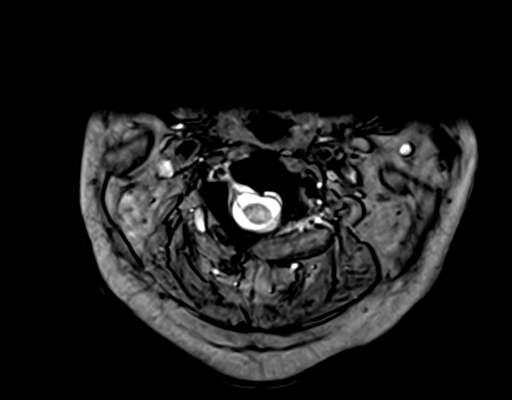

[Series 6: T2 · axial · 3.0mm · 0.62mm/px · z∈[-80,+13]mm · 8 of 28 slices shown (2 of 2)]
[im 1/28]
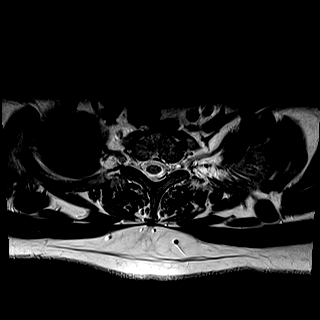
[im 5/28]
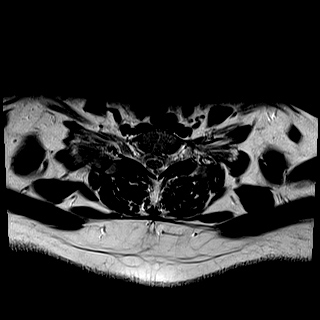
[im 9/28]
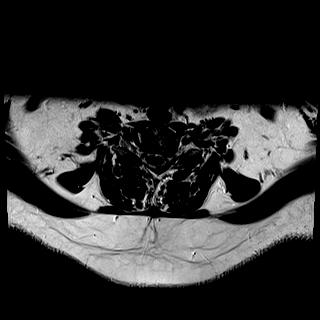
[im 13/28]
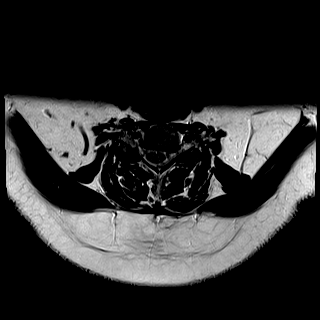
[im 15/28]
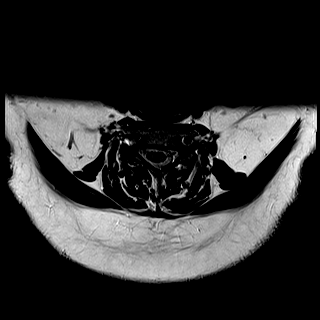
[im 19/28]
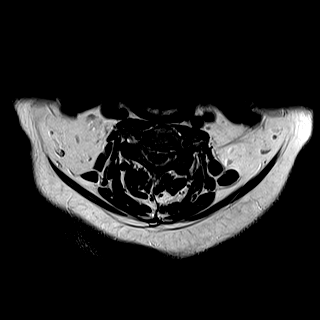
[im 23/28]
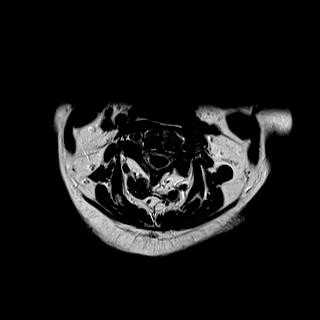
[im 28/28]
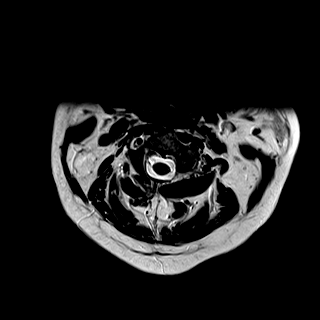

[29 of 48 positions shown; findings below may reference images not displayed]

FINDINGS: Alignment: Straightening of the normal cervical lordosis. No
listhesis.

Vertebrae: Vertebral body height maintained without evidence for
acute or chronic fracture. Bone marrow signal intensity within
normal limits. Benign hemangioma partially visualized within the T3
vertebral body. No other discrete or worrisome osseous lesions. No
abnormal marrow edema.

Cord: Signal intensity within the cervical spinal cord is normal.
Normal cord caliber morphology.

Posterior Fossa, vertebral arteries, paraspinal tissues: Visualized
brain and posterior fossa within normal limits. Paraspinous and
prevertebral soft tissues are normal. Normal intravascular flow
voids seen within the vertebral arteries bilaterally.

Disc levels:

C2-C3: Unremarkable.

C3-C4: Mild annular disc bulge with bilateral uncovertebral
hypertrophy. Mild indentation of the ventral thecal sac without
significant spinal stenosis. Mild right C4 foraminal narrowing. No
significant left foraminal encroachment. Appearance is stable.

C4-C5: Minimal annular disc bulge. Mild right-sided facet
hypertrophy. No significant canal or foraminal stenosis.

C5-C6: Diffuse disc bulge with bilateral uncovertebral spurring.
Mild right-sided facet degeneration. No significant canal or
foraminal stenosis.

C6-C7: Mild disc bulge with uncovertebral hypertrophy. Resultant
mild left C7 foraminal stenosis, stable. No significant canal or
right foraminal narrowing.

C7-T1: Tiny central disc protrusion minimally indents the ventral
thecal sac. No significant spinal stenosis. Foramina remain patent.

Visualized upper thoracic spine demonstrates no significant finding.
IMPRESSION: 1. Overall, no significant interval change in appearance of the
cervical spine as compared to 10/08/2016.
2. Mild multilevel cervical spondylosis without significant spinal
stenosis as above.
3. Mild right C4 and left C7 foraminal narrowing related to disc
bulge and uncovertebral hypertrophy, stable.
4. Mild right-sided facet degeneration at C4-5 and C5-6.

## 2020-10-11 MED ORDER — CLINDAMYCIN HCL 75 MG PO CAPS: 75.0000 mg | ORAL_CAPSULE | Freq: Three times a day (TID) | ORAL | 0 refills | Status: DC

## 2020-10-11 NOTE — Telephone Encounter (Signed)
Antibiotics sent to [facility].  Ultrasound ordered.

## 2020-10-11 NOTE — Telephone Encounter (Signed)
Jennifer [name] called and states her sinuses are worse. She has tried nasal rinses, guaifenesin and Afrin. She is wanting an antibiotic or a decongestant that will not raise her blood pressure. Please advise.

## 2020-10-11 NOTE — Telephone Encounter (Signed)
Dr. Jarvis Newcomer called asking about a Ultrasound doppler I believe is what she said it was hard to understand her message she stated you were going to put it in for her legs and she wanted it sent to cardiology at [facility] not the main [place]? She said she was in a lot of pain and wanted to get it done today or tomorrow I wasn't sure what she was talking about on her voicemail. Please advise - CF

## 2020-10-12 ENCOUNTER — Telehealth: Payer: Medicare HMO | Admitting: Family Medicine

## 2020-10-12 DIAGNOSIS — R220 Localized swelling, mass and lump, head: Secondary | ICD-10-CM | POA: Diagnosis not present

## 2020-10-12 DIAGNOSIS — B9689 Other specified bacterial agents as the cause of diseases classified elsewhere: Secondary | ICD-10-CM | POA: Diagnosis not present

## 2020-10-12 DIAGNOSIS — Z87891 Personal history of nicotine dependence: Secondary | ICD-10-CM | POA: Diagnosis not present

## 2020-10-12 DIAGNOSIS — J019 Acute sinusitis, unspecified: Secondary | ICD-10-CM | POA: Diagnosis not present

## 2020-10-12 DIAGNOSIS — Z8616 Personal history of COVID-19: Secondary | ICD-10-CM | POA: Diagnosis not present

## 2020-10-12 DIAGNOSIS — R0602 Shortness of breath: Secondary | ICD-10-CM | POA: Diagnosis not present

## 2020-10-12 NOTE — Telephone Encounter (Signed)
Printed order and insurance card and faxed to [facility] for image. - CF

## 2020-10-13 IMAGING — CR DG HIP (WITH OR WITHOUT PELVIS) 2-3V*L*
3 series · 3 of 3 positions shown · non-contrast
Comparison: None.

CLINICAL DATA: Left groin pain.

EXAM:
DG HIP (WITH OR WITHOUT PELVIS) 2-3V LEFT

[t pelvis a.p.]
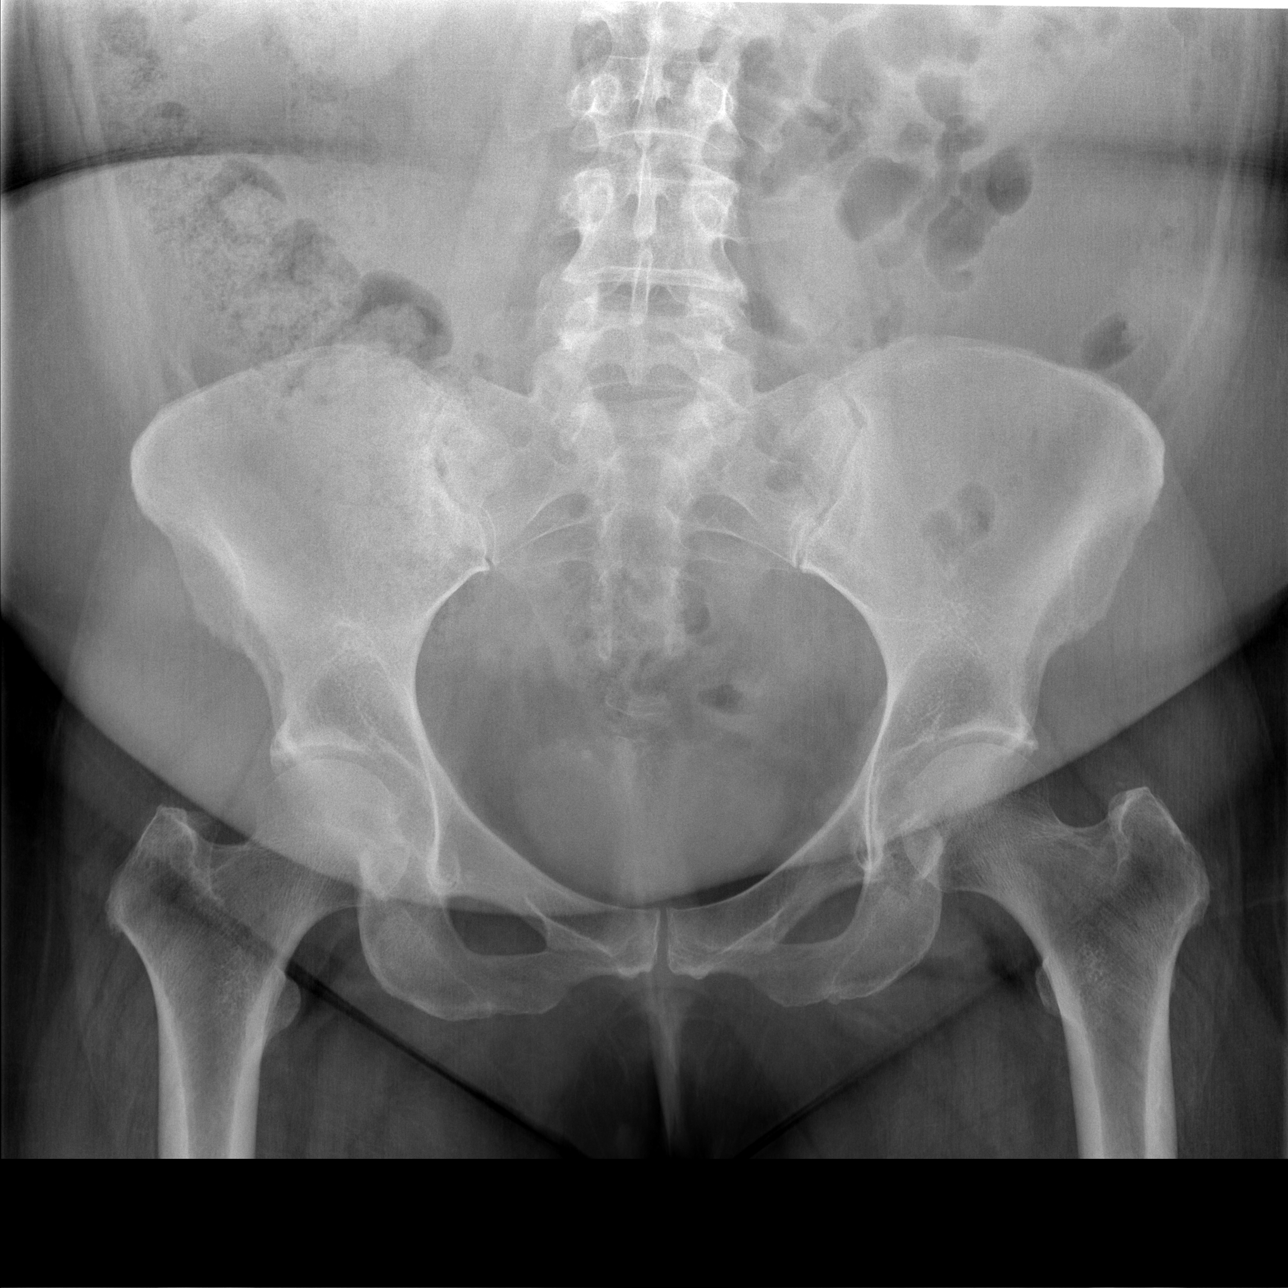

[t hip ap left]
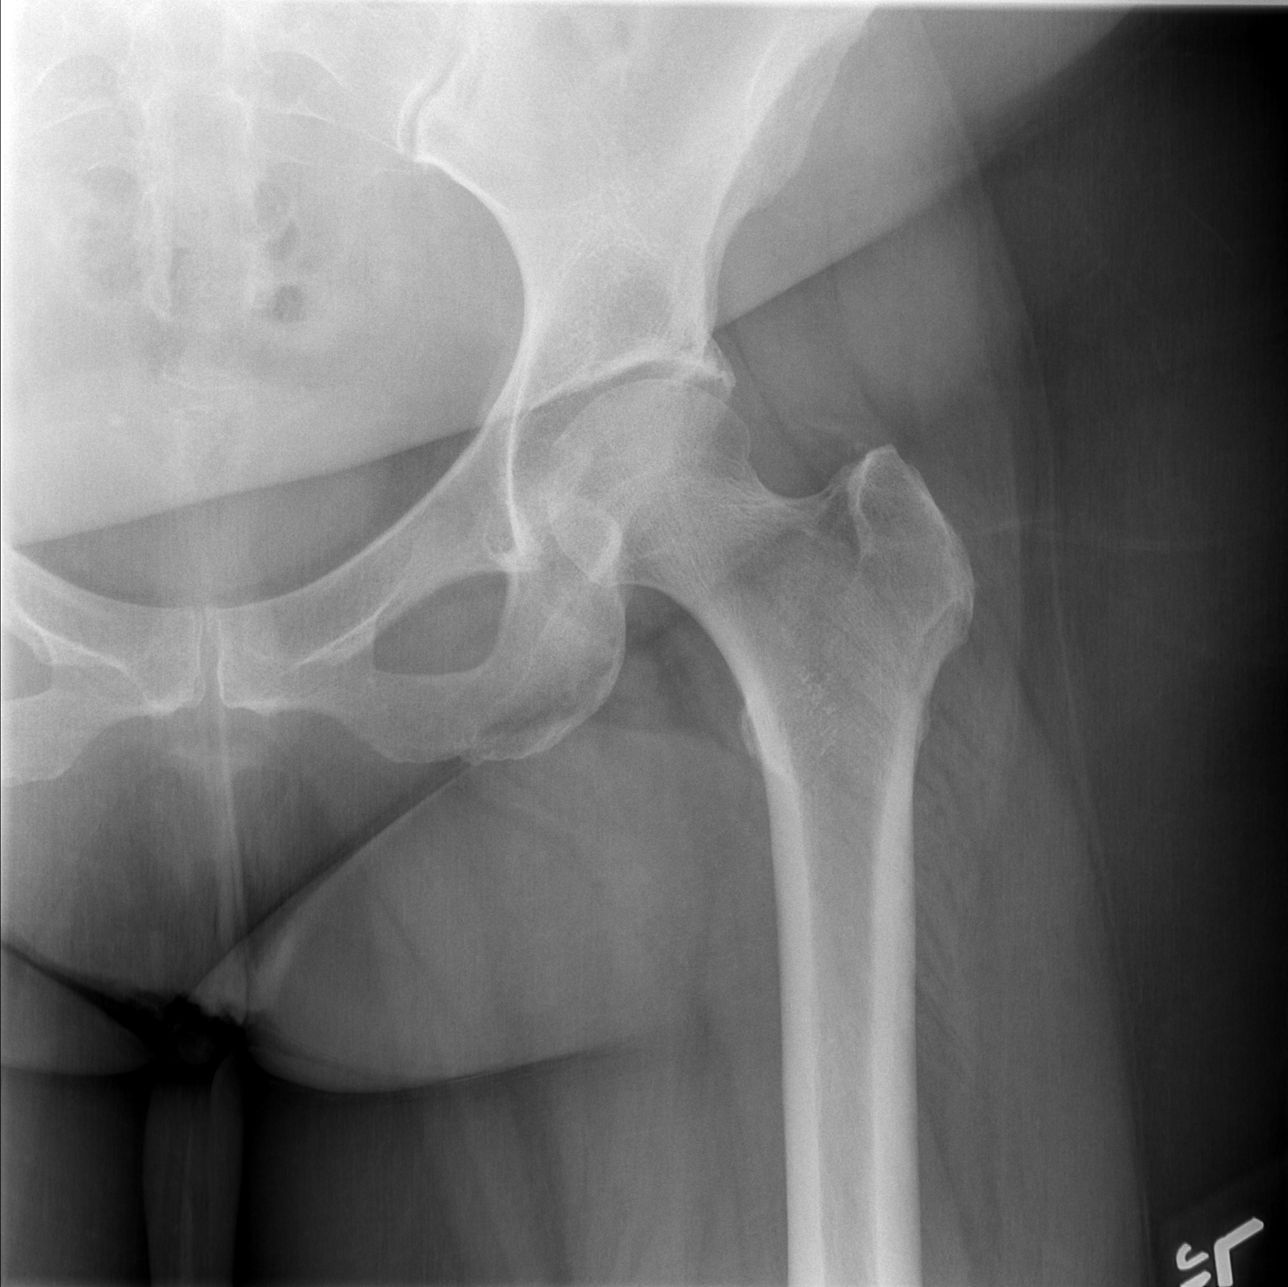

[t hip frog leg left]
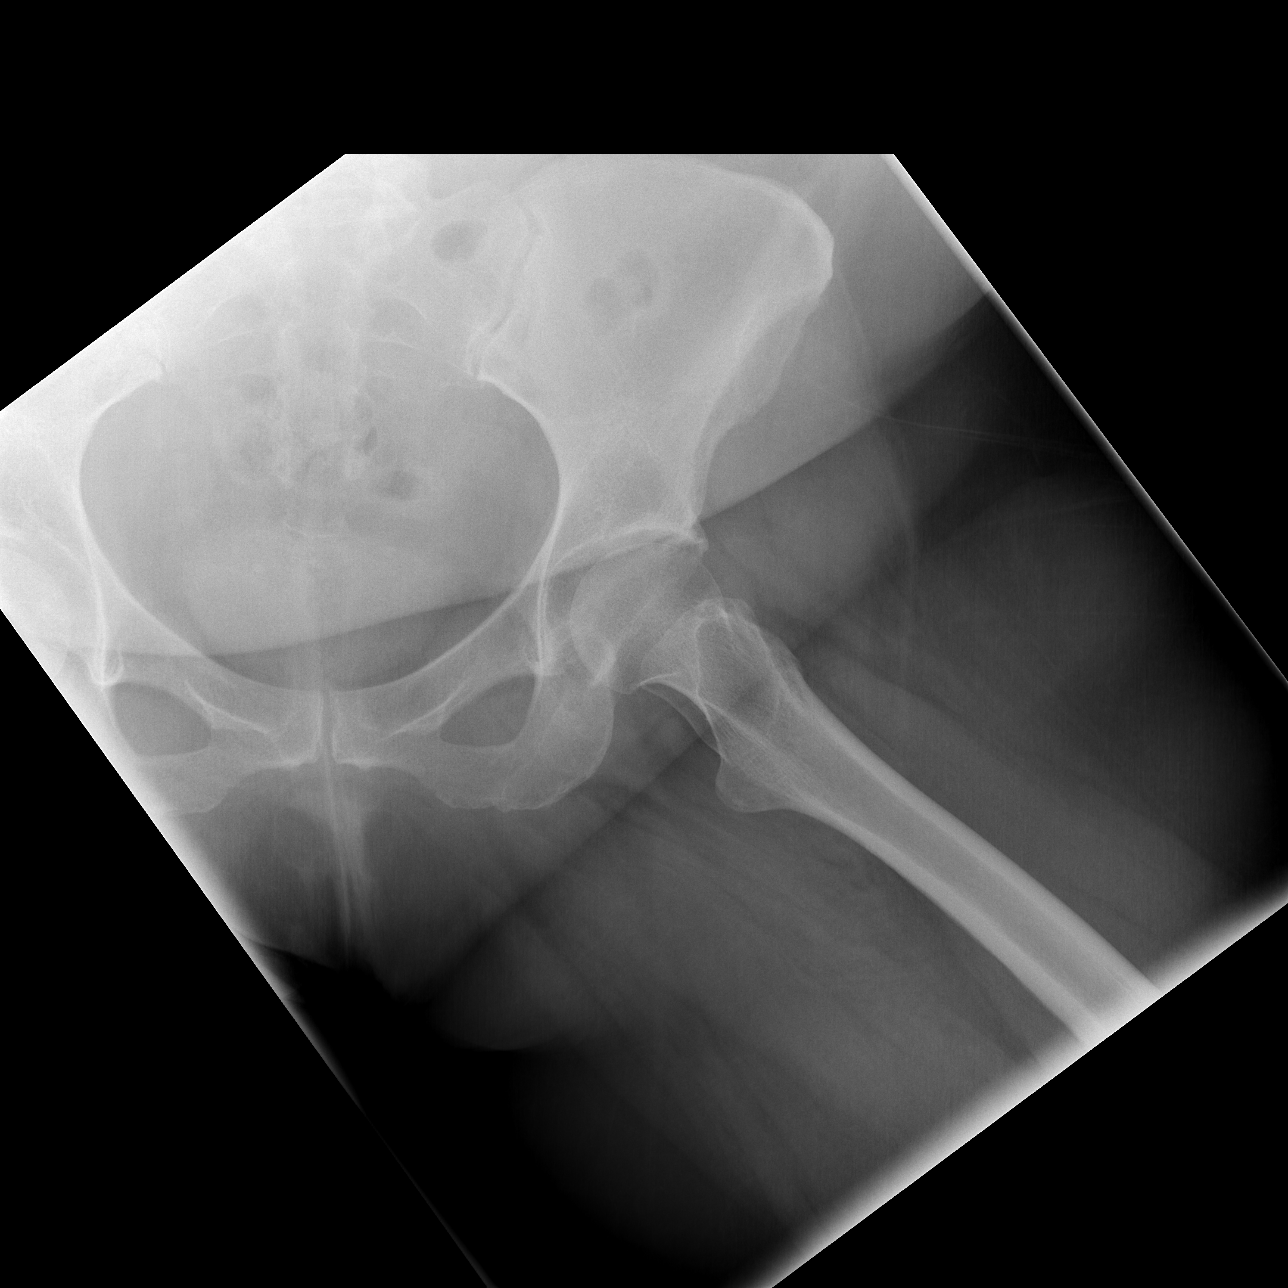

[3 of 3 positions shown; findings below may reference images not displayed]

FINDINGS: There is no evidence of hip fracture or dislocation. Mild bilateral
hip osteoarthritis. No acute fracture or dislocation identified. No
radio-opaque foreign body or soft tissue calcification.
IMPRESSION: Mild bilateral hip osteoarthritis.  No acute abnormality.

## 2020-10-14 ENCOUNTER — Telehealth: Payer: Self-pay | Admitting: *Deleted

## 2020-10-14 NOTE — Telephone Encounter (Signed)
Pt calls today complaining of increasing facial pain and pressure.  Is currently taking the Clindamycin you wrote on Monday, using Nasonex nasal spray, and doing the nasal flushes.  I told her that at this point she should probably be seen in person but she can't get here tomorrow and wants to know if there's anything else that can be added for her, ie a higher dose of abx.  Please advise.

## 2020-10-15 ENCOUNTER — Encounter: Payer: Self-pay | Admitting: Family Medicine

## 2020-10-15 ENCOUNTER — Other Ambulatory Visit: Payer: Self-pay

## 2020-10-15 ENCOUNTER — Ambulatory Visit (HOSPITAL_BASED_OUTPATIENT_CLINIC_OR_DEPARTMENT_OTHER)
Admission: RE | Admit: 2020-10-15 | Discharge: 2020-10-15 | Disposition: A | Discharge status: Home / Self Care | Payer: Medicare HMO | Source: Ambulatory Visit | Attending: Family Medicine | Admitting: Family Medicine

## 2020-10-15 ENCOUNTER — Ambulatory Visit (HOSPITAL_BASED_OUTPATIENT_CLINIC_OR_DEPARTMENT_OTHER): Admission: RE | Admit: 2020-10-15 | Payer: Medicare HMO | Source: Ambulatory Visit

## 2020-10-15 DIAGNOSIS — R229 Localized swelling, mass and lump, unspecified: Secondary | ICD-10-CM | POA: Diagnosis not present

## 2020-10-15 DIAGNOSIS — R457 State of emotional shock and stress, unspecified: Secondary | ICD-10-CM | POA: Diagnosis not present

## 2020-10-15 DIAGNOSIS — R2241 Localized swelling, mass and lump, right lower limb: Secondary | ICD-10-CM

## 2020-10-15 DIAGNOSIS — R42 Dizziness and giddiness: Secondary | ICD-10-CM | POA: Diagnosis not present

## 2020-10-15 DIAGNOSIS — J321 Chronic frontal sinusitis: Secondary | ICD-10-CM | POA: Diagnosis not present

## 2020-10-15 DIAGNOSIS — Z79899 Other long term (current) drug therapy: Secondary | ICD-10-CM | POA: Diagnosis not present

## 2020-10-15 DIAGNOSIS — I10 Essential (primary) hypertension: Secondary | ICD-10-CM | POA: Diagnosis not present

## 2020-10-15 DIAGNOSIS — R0689 Other abnormalities of breathing: Secondary | ICD-10-CM | POA: Diagnosis not present

## 2020-10-15 DIAGNOSIS — R000 Tachycardia, unspecified: Secondary | ICD-10-CM | POA: Diagnosis not present

## 2020-10-15 MED ORDER — CLINDAMYCIN HCL 150 MG PO CAPS: 150.0000 mg | ORAL_CAPSULE | Freq: Three times a day (TID) | ORAL | 0 refills | Status: DC

## 2020-10-15 NOTE — Assessment & Plan Note (Signed)
He is open to trying a mood medication will start with low-dose of Lexapro.  I did give her a small quantity of Valium to use as well really encouraged her to reach out for counseling/therapy she would prefer to find something in her local area if possible.

## 2020-10-15 NOTE — Telephone Encounter (Signed)
Prescription for higher strength clindamycin sent to pharmacy.

## 2020-10-15 NOTE — Assessment & Plan Note (Signed)
Pressure still not well controlled today on exam she felt that she could not tolerate the hydrochlorothiazide I think she should try to go back to the spironolactone if possible.

## 2020-10-15 NOTE — Telephone Encounter (Signed)
Reem called back wanting recommendations from Dr Madilyn Fireman. I advised to take 600 mg of ibuprofen every 8 hours. She is still wanting Dr Gardiner Ramus recommendations.

## 2020-10-17 DIAGNOSIS — R9431 Abnormal electrocardiogram [ECG] [EKG]: Secondary | ICD-10-CM | POA: Diagnosis not present

## 2020-10-17 DIAGNOSIS — R0602 Shortness of breath: Secondary | ICD-10-CM | POA: Diagnosis not present

## 2020-10-17 DIAGNOSIS — R059 Cough, unspecified: Secondary | ICD-10-CM | POA: Diagnosis not present

## 2020-10-17 DIAGNOSIS — I10 Essential (primary) hypertension: Secondary | ICD-10-CM | POA: Diagnosis not present

## 2020-10-17 DIAGNOSIS — R079 Chest pain, unspecified: Secondary | ICD-10-CM | POA: Diagnosis not present

## 2020-10-17 DIAGNOSIS — R000 Tachycardia, unspecified: Secondary | ICD-10-CM | POA: Diagnosis not present

## 2020-10-17 DIAGNOSIS — Z8616 Personal history of COVID-19: Secondary | ICD-10-CM | POA: Diagnosis not present

## 2020-10-18 DIAGNOSIS — M79602 Pain in left arm: Secondary | ICD-10-CM | POA: Diagnosis not present

## 2020-10-18 DIAGNOSIS — I498 Other specified cardiac arrhythmias: Secondary | ICD-10-CM | POA: Diagnosis not present

## 2020-10-18 DIAGNOSIS — R079 Chest pain, unspecified: Secondary | ICD-10-CM | POA: Diagnosis not present

## 2020-10-18 DIAGNOSIS — R002 Palpitations: Secondary | ICD-10-CM | POA: Diagnosis not present

## 2020-10-18 DIAGNOSIS — E876 Hypokalemia: Secondary | ICD-10-CM | POA: Diagnosis not present

## 2020-10-18 DIAGNOSIS — E1165 Type 2 diabetes mellitus with hyperglycemia: Secondary | ICD-10-CM | POA: Diagnosis not present

## 2020-10-18 DIAGNOSIS — S2231XA Fracture of one rib, right side, initial encounter for closed fracture: Secondary | ICD-10-CM | POA: Diagnosis not present

## 2020-10-18 DIAGNOSIS — F419 Anxiety disorder, unspecified: Secondary | ICD-10-CM | POA: Diagnosis not present

## 2020-10-18 DIAGNOSIS — R9431 Abnormal electrocardiogram [ECG] [EKG]: Secondary | ICD-10-CM | POA: Diagnosis not present

## 2020-10-18 DIAGNOSIS — R000 Tachycardia, unspecified: Secondary | ICD-10-CM | POA: Diagnosis not present

## 2020-10-18 DIAGNOSIS — R072 Precordial pain: Secondary | ICD-10-CM | POA: Diagnosis not present

## 2020-10-19 ENCOUNTER — Telehealth (INDEPENDENT_AMBULATORY_CARE_PROVIDER_SITE_OTHER): Payer: Medicare HMO | Admitting: Family Medicine

## 2020-10-19 VITALS — HR 90

## 2020-10-19 DIAGNOSIS — I10 Essential (primary) hypertension: Secondary | ICD-10-CM

## 2020-10-19 DIAGNOSIS — E611 Iron deficiency: Secondary | ICD-10-CM | POA: Diagnosis not present

## 2020-10-19 DIAGNOSIS — I471 Supraventricular tachycardia: Secondary | ICD-10-CM

## 2020-10-19 DIAGNOSIS — R4189 Other symptoms and signs involving cognitive functions and awareness: Secondary | ICD-10-CM

## 2020-10-19 DIAGNOSIS — F418 Other specified anxiety disorders: Secondary | ICD-10-CM | POA: Diagnosis not present

## 2020-10-19 DIAGNOSIS — J0191 Acute recurrent sinusitis, unspecified: Secondary | ICD-10-CM

## 2020-10-19 DIAGNOSIS — M797 Fibromyalgia: Secondary | ICD-10-CM | POA: Diagnosis not present

## 2020-10-19 DIAGNOSIS — I4719 Other supraventricular tachycardia: Secondary | ICD-10-CM

## 2020-10-19 LAB — COMPREHENSIVE METABOLIC PANEL: Calcium: 9 (ref 8.7–10.7)

## 2020-10-19 LAB — BASIC METABOLIC PANEL
BUN: 12 (ref 4–21)
CO2: 26 — AB (ref 13–22)
Chloride: 109 — AB (ref 99–108)
Creatinine: 0.7 (ref 0.5–1.1)
Glucose: 132
Potassium: 3.6 (ref 3.4–5.3)
Sodium: 142 (ref 137–147)

## 2020-10-19 NOTE — Assessment & Plan Note (Signed)
encouraged her to keep f/u with Cardiology

## 2020-10-19 NOTE — Assessment & Plan Note (Signed)
Pick up the higher strength clindamycin.

## 2020-10-19 NOTE — Assessment & Plan Note (Addendum)
Has had whole body pain.  Really feel like she is having a fibromyalgia flare right now.  We discussed just graded movement and exercise.  I do not think she would do well with starting a new medication for fibromyalgia right now she has a lot of other things going on it would be really difficult to assess her response to the medication.

## 2020-10-19 NOTE — Progress Notes (Signed)
Trouble w/palpitations she has tried contacting cardiology.   Whole body hurts, trouble walking. R foot is swollen.   She feels that she still has infection going on and thinks that she will need more.   She will need a refill on the Rosuvastatin 5 mg because she lost the previous prescription
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
## 2020-10-21 ENCOUNTER — Encounter: Payer: Self-pay | Admitting: Family Medicine

## 2020-10-21 DIAGNOSIS — J329 Chronic sinusitis, unspecified: Secondary | ICD-10-CM | POA: Diagnosis not present

## 2020-10-21 DIAGNOSIS — Z8616 Personal history of COVID-19: Secondary | ICD-10-CM | POA: Diagnosis not present

## 2020-10-21 DIAGNOSIS — Z87891 Personal history of nicotine dependence: Secondary | ICD-10-CM | POA: Diagnosis not present

## 2020-10-21 DIAGNOSIS — R0981 Nasal congestion: Secondary | ICD-10-CM | POA: Diagnosis not present

## 2020-10-21 DIAGNOSIS — R110 Nausea: Secondary | ICD-10-CM | POA: Diagnosis not present

## 2020-10-21 NOTE — Assessment & Plan Note (Signed)
Not sure what else to add to her regimen at this point I would like her to try taking the hydrochlorothiazide little bit more consistently.

## 2020-10-21 NOTE — Assessment & Plan Note (Signed)
Fortunately a lot of places are booked out.  And there is limitations with who will take her insurance and she would prefer a female provider sore but stuck trying to find her help.  Acutely we did recommend going to behavioral health for a walk-in appointment same day if needed.  Otherwise I would encourage her to go ahead and schedule appointment even if it several months out and then asked to be put on a waiting list.

## 2020-10-22 DIAGNOSIS — R0602 Shortness of breath: Secondary | ICD-10-CM | POA: Diagnosis not present

## 2020-10-22 DIAGNOSIS — R9431 Abnormal electrocardiogram [ECG] [EKG]: Secondary | ICD-10-CM | POA: Diagnosis not present

## 2020-10-22 DIAGNOSIS — R072 Precordial pain: Secondary | ICD-10-CM | POA: Diagnosis not present

## 2020-10-22 DIAGNOSIS — R0789 Other chest pain: Secondary | ICD-10-CM | POA: Diagnosis not present

## 2020-10-22 DIAGNOSIS — E876 Hypokalemia: Secondary | ICD-10-CM | POA: Diagnosis not present

## 2020-10-22 DIAGNOSIS — J45909 Unspecified asthma, uncomplicated: Secondary | ICD-10-CM | POA: Diagnosis not present

## 2020-10-22 DIAGNOSIS — R55 Syncope and collapse: Secondary | ICD-10-CM | POA: Diagnosis not present

## 2020-10-22 DIAGNOSIS — R000 Tachycardia, unspecified: Secondary | ICD-10-CM | POA: Diagnosis not present

## 2020-10-22 DIAGNOSIS — I10 Essential (primary) hypertension: Secondary | ICD-10-CM | POA: Diagnosis not present

## 2020-10-22 DIAGNOSIS — Z7951 Long term (current) use of inhaled steroids: Secondary | ICD-10-CM | POA: Diagnosis not present

## 2020-10-22 DIAGNOSIS — R198 Other specified symptoms and signs involving the digestive system and abdomen: Secondary | ICD-10-CM | POA: Diagnosis not present

## 2020-10-22 DIAGNOSIS — R079 Chest pain, unspecified: Secondary | ICD-10-CM | POA: Diagnosis not present

## 2020-10-22 DIAGNOSIS — R030 Elevated blood-pressure reading, without diagnosis of hypertension: Secondary | ICD-10-CM | POA: Diagnosis not present

## 2020-10-23 DIAGNOSIS — R9431 Abnormal electrocardiogram [ECG] [EKG]: Secondary | ICD-10-CM | POA: Diagnosis not present

## 2020-10-25 DIAGNOSIS — R42 Dizziness and giddiness: Secondary | ICD-10-CM | POA: Diagnosis not present

## 2020-10-25 DIAGNOSIS — D500 Iron deficiency anemia secondary to blood loss (chronic): Secondary | ICD-10-CM | POA: Diagnosis not present

## 2020-10-25 DIAGNOSIS — R9431 Abnormal electrocardiogram [ECG] [EKG]: Secondary | ICD-10-CM | POA: Diagnosis not present

## 2020-10-25 DIAGNOSIS — J4520 Mild intermittent asthma, uncomplicated: Secondary | ICD-10-CM | POA: Diagnosis not present

## 2020-10-25 DIAGNOSIS — N644 Mastodynia: Secondary | ICD-10-CM | POA: Diagnosis not present

## 2020-10-25 DIAGNOSIS — F419 Anxiety disorder, unspecified: Secondary | ICD-10-CM | POA: Diagnosis not present

## 2020-10-25 DIAGNOSIS — R0789 Other chest pain: Secondary | ICD-10-CM | POA: Diagnosis not present

## 2020-10-25 DIAGNOSIS — E611 Iron deficiency: Secondary | ICD-10-CM | POA: Diagnosis not present

## 2020-10-25 DIAGNOSIS — R079 Chest pain, unspecified: Secondary | ICD-10-CM | POA: Diagnosis not present

## 2020-10-25 DIAGNOSIS — M954 Acquired deformity of chest and rib: Secondary | ICD-10-CM | POA: Diagnosis not present

## 2020-10-27 DIAGNOSIS — H5789 Other specified disorders of eye and adnexa: Secondary | ICD-10-CM | POA: Diagnosis not present

## 2020-10-27 DIAGNOSIS — R5383 Other fatigue: Secondary | ICD-10-CM | POA: Diagnosis not present

## 2020-10-27 DIAGNOSIS — R0789 Other chest pain: Secondary | ICD-10-CM | POA: Diagnosis not present

## 2020-10-27 DIAGNOSIS — R000 Tachycardia, unspecified: Secondary | ICD-10-CM | POA: Diagnosis not present

## 2020-10-27 DIAGNOSIS — R0602 Shortness of breath: Secondary | ICD-10-CM | POA: Diagnosis not present

## 2020-10-27 DIAGNOSIS — F419 Anxiety disorder, unspecified: Secondary | ICD-10-CM | POA: Diagnosis not present

## 2020-10-27 DIAGNOSIS — R531 Weakness: Secondary | ICD-10-CM | POA: Diagnosis not present

## 2020-10-27 DIAGNOSIS — Z8616 Personal history of COVID-19: Secondary | ICD-10-CM | POA: Diagnosis not present

## 2020-10-27 DIAGNOSIS — S2231XA Fracture of one rib, right side, initial encounter for closed fracture: Secondary | ICD-10-CM | POA: Diagnosis not present

## 2020-10-28 ENCOUNTER — Telehealth: Payer: Self-pay | Admitting: Family Medicine

## 2020-10-28 DIAGNOSIS — R000 Tachycardia, unspecified: Secondary | ICD-10-CM | POA: Diagnosis not present

## 2020-10-28 NOTE — Telephone Encounter (Signed)
Call pt: potassium was low on labs. I am sure she saw it.  Is she taking her potassium daily?

## 2020-10-29 ENCOUNTER — Other Ambulatory Visit: Payer: Self-pay

## 2020-10-29 ENCOUNTER — Ambulatory Visit (INDEPENDENT_AMBULATORY_CARE_PROVIDER_SITE_OTHER): Payer: Medicare HMO | Admitting: Family

## 2020-10-29 ENCOUNTER — Encounter: Payer: Self-pay | Admitting: Family Medicine

## 2020-10-29 ENCOUNTER — Ambulatory Visit (INDEPENDENT_AMBULATORY_CARE_PROVIDER_SITE_OTHER): Payer: Medicare HMO | Admitting: Family Medicine

## 2020-10-29 ENCOUNTER — Encounter: Payer: Self-pay | Admitting: Family

## 2020-10-29 VITALS — BP 152/82 | HR 96 | Temp 97.8°F | Resp 20

## 2020-10-29 VITALS — BP 151/78 | HR 105 | Ht 63.0 in | Wt 218.0 lb

## 2020-10-29 DIAGNOSIS — R079 Chest pain, unspecified: Secondary | ICD-10-CM | POA: Diagnosis not present

## 2020-10-29 DIAGNOSIS — K219 Gastro-esophageal reflux disease without esophagitis: Secondary | ICD-10-CM | POA: Diagnosis not present

## 2020-10-29 DIAGNOSIS — I10 Essential (primary) hypertension: Secondary | ICD-10-CM

## 2020-10-29 DIAGNOSIS — R002 Palpitations: Secondary | ICD-10-CM | POA: Diagnosis not present

## 2020-10-29 DIAGNOSIS — I491 Atrial premature depolarization: Secondary | ICD-10-CM | POA: Diagnosis not present

## 2020-10-29 DIAGNOSIS — Z79899 Other long term (current) drug therapy: Secondary | ICD-10-CM

## 2020-10-29 DIAGNOSIS — I471 Supraventricular tachycardia: Secondary | ICD-10-CM

## 2020-10-29 DIAGNOSIS — U071 COVID-19: Secondary | ICD-10-CM | POA: Diagnosis not present

## 2020-10-29 DIAGNOSIS — F418 Other specified anxiety disorders: Secondary | ICD-10-CM

## 2020-10-29 DIAGNOSIS — J4540 Moderate persistent asthma, uncomplicated: Secondary | ICD-10-CM | POA: Diagnosis not present

## 2020-10-29 DIAGNOSIS — J310 Chronic rhinitis: Secondary | ICD-10-CM

## 2020-10-29 DIAGNOSIS — R1011 Right upper quadrant pain: Secondary | ICD-10-CM

## 2020-10-29 DIAGNOSIS — F419 Anxiety disorder, unspecified: Secondary | ICD-10-CM | POA: Diagnosis not present

## 2020-10-29 LAB — POCT URINALYSIS DIP (CLINITEK)
Bilirubin, UA: NEGATIVE
Blood, UA: NEGATIVE
Glucose, UA: NEGATIVE mg/dL
Ketones, POC UA: NEGATIVE mg/dL
Leukocytes, UA: NEGATIVE
Nitrite, UA: NEGATIVE
Spec Grav, UA: >=1.03 — AB (ref 1.010–1.025)
Urobilinogen, UA: 0.2 E.U./dL
pH, UA: 5.5 (ref 5.0–8.0)
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
## 2020-10-29 NOTE — Patient Instructions (Signed)
Do a clean out of the bowels this weekend.  I recommend using two dulcolax and then start the Miralax.  See if your bloating and distention is better.

## 2020-10-29 NOTE — Progress Notes (Addendum)
[place] [id number] Dept: [phone]  FOLLOW UP NOTE  Patient ID: Jennifer [name], female    DOB: 1966/03/22  Age: 55 y.o. MRN: [id number] Date of Office Visit: 10/29/2020  Assessment  Chief Complaint: Asthma  HPI Jennifer [name] is a 55 year old woman who presents today for an acute visit.  She was last seen on September 27, 2020 by Dr. Ernst Bowler for moderate persistent asthma and chronic rhinitis.  Moderate persistent asthma is reported as not well controlled with Xopenex HFA or Xopenex 1.25 mg unit dose via nebulizer as needed.  For the past couple weeks she has had a productive cough that is thick and clear, wheezing that she can hear in her  throat, but not in her chest, tightness in her chest, and shortness of breath.  She is not using  Pulmicort 0.25 mg twice a day and Performist twice a day that was prescribed at the last office visit.  She reports that both of these medications were out of range and that if medication is not in the correct tier she cannot afford it.In the past she has had trouble with steroid inhalers.  She will get thrush real bad even when she rinses her mouth out afterwards.  Arnuity causes blisters in her mouth.  I offered her a sample of Alvesco, since this is a pro drug, to see if she would tolerate this medication and she has not tried it.  She reports, "what is the point of trying it if my insurance will not cover it."  She also cannot tolerate prednisone or dexamethasone.  She reports that dexamethasone and prednisone causes her blood sugar to go up and" messes with her mentally".  She has also tried Spiriva in the past and cannot remember her what problem she had with it.  When she uses her Xopenex inhaler or nebulizer she feels that it makes her breathing worse.  She feels like her" lungs are two inflated balloons and she is breathing shallow."  She had a chest x-ray on October 27, 2020 showing no active cardiopulmonary disease.  She has had  1 round of steroids since her last office visit and reports that" it almost put her in the nut house."  She has made multiple trips to the emergency room since her last office visit for shortness of breath, chest pain, hypertension, cough, sinus congestion, and anxiety.  She was leaving the office she said that she has not been effective the Pulmicort due to cost.  She saw [facility] on September 24, 2020 and was given an aeropika valve.  She reports that her husband does not think she should use it since it causes her to cough and then throw up at times.  She did mention though that it did help get mucus up at times.  She is not able to get to [facility] easily due to location and needing a driver.  She reports that they wanted to look at her records before prescribing any medications.  She reports that she has been having chest pain and increased blood pressure and just got out of an appointment with her cardiologist earlier this morning.  She is in the process of getting scheduled for a loop recorder and starting Ranexa for angina.  Chronic rhinitis is reported as moderately controlled with saline nasal rinses and mometasone nasal spray.  She is currently on an antibiotic for a sinus infection and reports that she is now  having nasal congestion and postnasal drip.  She is no longer having rhinorrhea.  Before starting on the antibiotic her rhinorrhea was brown, green, yellow in color.   Drug Allergies:  Allergies  Allergen Reactions   Azithromycin Shortness Of Breath    Lip swelling, SOB.      Ciprofloxacin Swelling    REACTION: tongue swells   Codeine Shortness Of Breath   Erythromycin Base Itching and Rash   Sulfa Antibiotics Shortness Of Breath, Rash and Other (See Comments)   Sulfasalazine Rash and Shortness Of Breath    Other reaction(s): Other (See Comments) Other reaction(s): SHORTNESS OF BREATH   Telmisartan Swelling    Tongue swelling, Micardis    Ace Inhibitors Cough   Aspirin Hives and Other (See Comments)    flushing   Atenolol Other (See Comments)    Squeezing chest sensation   Avelox [Moxifloxacin Hcl In Nacl] Itching        Beta Adrenergic Blockers Other (See Comments)    Feels like chest tightening labetalol, bystolic  Feels like chest tightening "Metoprolol"    Buspar [Buspirone] Other (See Comments)    Light headed   Butorphanol Tartrate Other (See Comments)    Patient aggitated   Cetirizine Hives and Rash        Clonidine Hcl     REACTION: makes blood pressure high   Cortisone     Feels like she is going crazy   Erythromycin Rash   Fentanyl Other (See Comments)    aggressive    Fluoxetine Hcl Other (See Comments)    REACTION: headaches   Ketorolac Tromethamine     jittery   Lidocaine Other (See Comments)    When it involves the throat,    Lisinopril Cough   Metoclopramide Hcl Other (See Comments)    Dystonic reaction   Midazolam Other (See Comments)    agitation Slow to wake up   Montelukast Other (See Comments)    Singulair   Montelukast Sodium Other (See Comments)    DOES NOT REMEMBER  Don't remember-told not to take   Naproxen Other (See Comments)    FLUSHING Pt states she took Ibuprofen today (10/08/19)   Paroxetine Other (See Comments)    REACTION: headaches   Penicillins Rash   Pravastatin Other (See Comments)    Myalgias   Promethazine Other (See Comments)    Dystonic reaction   Promethazine Hcl Other (See Comments)    jittery   Quinolones Swelling and Rash   Serotonin Reuptake Inhibitors (Ssris) Other (See Comments)    Headache Effexor, prozac, zoloft,    Sertraline Hcl     REACTION: headaches   Stelazine [Trifluoperazine] Other (See Comments)    Dystonic reaction   Tobramycin Itching and Rash   Trifluoperazine Hcl     dystonic   Atrovent Nasal Spray [Ipratropium]     Tachycardia and shaking   Diltiazem Other (See Comments)    Chest pain   Polyethylene Glycol 3350      Other reaction(s): Laryngeal Edema (ALLERGY)   Propoxyphene    Adhesive [Tape] Rash    EKG monitor patches, some tapes Blisters, rash, itching, welts.   Butorphanol Anxiety    Patient agitated   Ceftriaxone Rash    rocephin   Iron Rash    Flushing with certain IV types   Metoclopramide Itching and Other (See Comments)    Dystonic reaction   Metronidazole Rash   Other Rash and Other (See Comments)    Uncoded Allergy. Allergen: steriods,  Other Reaction: Not Assessed Other reaction(s): Flushing (ALLERGY/intolerance), GI Upset (intolerance), Hypertension (intolerance), Increased Heart Rate (intolerance), Mental Status Changes (intolerance), Other (See Comments), Tachycardia / Palpitations  (intolerance) Hospital gowns leave a rash.    Prednisone Anxiety and Palpitations   Prochlorperazine Anxiety    Compazine:  Dystonic reaction   Venlafaxine Anxiety   Zyrtec [Cetirizine Hcl] Rash    All over body    Review of Systems: Review of Systems  Constitutional: Negative for chills and fever.  HENT:       Reports nasal congestion and postnasal drip.  Denies rhinorrhea  Eyes:       Denies itchy watery eyes  Respiratory: Positive for cough, shortness of breath and wheezing.   Cardiovascular: Positive for chest pain and palpitations.       Currently seeing cardiology  Gastrointestinal: Positive for heartburn.       Reports heartburn at times.  Currently seeing GI  Genitourinary: Negative for dysuria.  Skin: Negative for itching and rash.  Neurological: Positive for headaches.       Reports headaches at times and feels that this could be due to her increased blood pressure.  Endo/Heme/Allergies: Negative for environmental allergies.    Physical Exam: BP (!) 152/82 (BP Location: Left Arm, Patient Position: Sitting, Cuff Size: Normal)   Pulse 96   Temp 97.8 F (36.6 C) (Tympanic)   Resp 20   LMP 06/25/2013   SpO2 98%    Physical Exam Constitutional:      Appearance: Normal  appearance.  HENT:     Head: Normocephalic and atraumatic.     Right Ear: Tympanic membrane, ear canal and external ear normal.     Left Ear: Tympanic membrane, ear canal and external ear normal.     Nose: Nose normal.     Mouth/Throat:     Mouth: Mucous membranes are moist.     Pharynx: Oropharynx is clear.  Eyes:     Conjunctiva/sclera: Conjunctivae normal.  Cardiovascular:     Rate and Rhythm: Regular rhythm.     Heart sounds: Normal heart sounds.  Pulmonary:     Effort: Pulmonary effort is normal.     Breath sounds: Normal breath sounds.     Comments: Lungs clear to auscultation Musculoskeletal:     Cervical back: Neck supple.  Skin:    General: Skin is warm.  Neurological:     Mental Status: She is alert and oriented to person, place, and time.  Psychiatric:        Mood and Affect: Mood normal.        Behavior: Behavior normal.        Thought Content: Thought content normal.        Judgment: Judgment normal.     Diagnostics: FVC 2.58 L, FEV1 2.35 L.  Predicted FVC 3.31 L, FEV1 2.59 L.  Spirometry indicates mild restriction.  Spirometry is consistent with previous spirometry.  Assessment and Plan: 1. Moderate persistent asthma, unspecified whether complicated   2. Chronic rhinitis   3. Gastroesophageal reflux disease without esophagitis   4. Current use of beta blocker     No orders of the defined types were placed in this encounter.   Patient Instructions  1. Moderate persistent asthma, uncomplicated - Continue Xopenex hfa 2 puffs every 6 hours as needed for cough, wheeze,tightness in chest, or shortness of breath OR Xopenex 1.25 mg 1 unit dose via nebulizer every 6 hours as needed - Start Pulmicort 0.25 mg twice daily via the  nebulizer to help prevent cough and wheeze. Do not use Performist due to cost - Continue to follow up with [facility].  - Continue to use aerobika valve 1-2 times a day for 5-10 minutes as recommended by  [facility]  2. Chronic rhinitis - Continue with nasal saline rinses. -Continue the antibiotic you are currently taking for the sinus infection -Continue mometasone nasal spray 1-2 sprays each nostril once a day as needed for stuffy nose  3. Reflux -Continue to follow up with GI  Your blood pressure was elevated while in the office today. Schedule an appointment with your primary care physician or cardiology to discuss  Please let us know if this treatment plan is not working well for you. Schedule a follow up appointment in 2 months   No follow-ups on file.    Thank you for the opportunity to care for this patient.  Please do not hesitate to contact me with questions.  Althea Charon, FNP [facility]  I have provided oversight concerning Gareth Morgan' evaluation and treatment of this patient's health issues addressed during today's encounter. I agree with the assessment and therapeutic plan as outlined in the note.   Signed,   Jiles Prows, MD,  Allergy and Immunology,  [place] [facility] [place].

## 2020-10-29 NOTE — Telephone Encounter (Signed)
Called pt and she informed me that she hasn't been taking this daily just a few times a week.

## 2020-10-29 NOTE — Patient Instructions (Addendum)
1. Moderate persistent asthma, uncomplicated - Continue Xopenex hfa 2 puffs every 6 hours as needed for cough, wheeze,tightness in chest, or shortness of breath OR Xopenex 1.25 mg 1 unit dose via nebulizer every 6 hours as needed - Start Pulmicort 0.25 mg twice daily via the nebulizer to help prevent cough and wheeze. Do not use Performist due to cost - Continue to follow up with [facility].  - Continue to use aerobika valve 1-2 times a day for 5-10 minutes as recommended by  [facility] -Please call your insurance company to find out which inhaled corticosteroids//breathing medicines are in the correct tear that you can afford and let us know.  2. Chronic rhinitis - Continue with nasal saline rinses. -Continue the antibiotic you are currently taking for the sinus infection -Continue mometasone nasal spray 1-2 sprays each nostril once a day as needed for stuffy nose  3. Reflux -Continue to follow up with GI  Your blood pressure was elevated while in the office today. Schedule an appointment with your primary care physician or cardiology to discuss  Please let us know if this treatment plan is not working well for you. Schedule a follow up appointment in 2 months

## 2020-11-01 DIAGNOSIS — Z8719 Personal history of other diseases of the digestive system: Secondary | ICD-10-CM | POA: Diagnosis not present

## 2020-11-01 DIAGNOSIS — R9431 Abnormal electrocardiogram [ECG] [EKG]: Secondary | ICD-10-CM | POA: Diagnosis not present

## 2020-11-01 DIAGNOSIS — I10 Essential (primary) hypertension: Secondary | ICD-10-CM | POA: Diagnosis not present

## 2020-11-01 DIAGNOSIS — I959 Hypotension, unspecified: Secondary | ICD-10-CM | POA: Diagnosis not present

## 2020-11-01 DIAGNOSIS — R1310 Dysphagia, unspecified: Secondary | ICD-10-CM | POA: Diagnosis not present

## 2020-11-01 DIAGNOSIS — R1013 Epigastric pain: Secondary | ICD-10-CM | POA: Diagnosis not present

## 2020-11-01 DIAGNOSIS — R059 Cough, unspecified: Secondary | ICD-10-CM | POA: Diagnosis not present

## 2020-11-01 DIAGNOSIS — R079 Chest pain, unspecified: Secondary | ICD-10-CM | POA: Diagnosis not present

## 2020-11-01 DIAGNOSIS — R070 Pain in throat: Secondary | ICD-10-CM | POA: Diagnosis not present

## 2020-11-01 DIAGNOSIS — R0789 Other chest pain: Secondary | ICD-10-CM | POA: Diagnosis not present

## 2020-11-01 DIAGNOSIS — R457 State of emotional shock and stress, unspecified: Secondary | ICD-10-CM | POA: Diagnosis not present

## 2020-11-02 ENCOUNTER — Encounter: Payer: Self-pay | Admitting: Family Medicine

## 2020-11-03 ENCOUNTER — Encounter: Payer: Self-pay | Admitting: Family Medicine

## 2020-11-03 ENCOUNTER — Telehealth: Payer: Self-pay

## 2020-11-03 DIAGNOSIS — Z79899 Other long term (current) drug therapy: Secondary | ICD-10-CM | POA: Diagnosis not present

## 2020-11-03 DIAGNOSIS — Z1239 Encounter for other screening for malignant neoplasm of breast: Secondary | ICD-10-CM | POA: Diagnosis not present

## 2020-11-03 DIAGNOSIS — N644 Mastodynia: Secondary | ICD-10-CM

## 2020-11-03 LAB — BASIC METABOLIC PANEL
BUN: 13 (ref 4–21)
CO2: 24 — AB (ref 13–22)
Chloride: 106 (ref 99–108)
Creatinine: 0.7 (ref 0.5–1.1)
Glucose: 204
Potassium: 3.6 (ref 3.4–5.3)
Sodium: 140 (ref 137–147)

## 2020-11-03 LAB — COMPREHENSIVE METABOLIC PANEL
Calcium: 9 (ref 8.7–10.7)
GFR calc non Af Amer: >90

## 2020-11-03 NOTE — Assessment & Plan Note (Signed)
Not well controlled today.  Is considering starting a new medication with cardiology

## 2020-11-03 NOTE — Telephone Encounter (Signed)
Jennifer [name] with [facility] Imaging department called and left a message. She stated when Jennifer [name] went in she complained about right breast pain. She states she will need an new order for bilateral diagnostic mammogram with right breast ultra sound.  Fax - [phone]

## 2020-11-03 NOTE — Assessment & Plan Note (Signed)
Did discuss may have to get on a weight list for a therapist as a lot of places are booked out. She would ike to find someone in [facility]

## 2020-11-03 NOTE — Telephone Encounter (Signed)
Orders placed  Please fax

## 2020-11-03 NOTE — Telephone Encounter (Signed)
Orders faxed

## 2020-11-03 NOTE — Assessment & Plan Note (Signed)
Planning on heart monitor with cardiology.

## 2020-11-04 ENCOUNTER — Encounter: Payer: Self-pay | Admitting: Family Medicine

## 2020-11-04 ENCOUNTER — Other Ambulatory Visit: Payer: Self-pay

## 2020-11-04 ENCOUNTER — Emergency Department (HOSPITAL_BASED_OUTPATIENT_CLINIC_OR_DEPARTMENT_OTHER)
Admission: EM | Admit: 2020-11-04 | Discharge: 2020-11-04 | Disposition: A | Discharge status: Home / Self Care | Payer: Medicare HMO | Attending: Emergency Medicine | Admitting: Emergency Medicine

## 2020-11-04 ENCOUNTER — Telehealth (INDEPENDENT_AMBULATORY_CARE_PROVIDER_SITE_OTHER): Payer: Medicare HMO | Admitting: Family Medicine

## 2020-11-04 ENCOUNTER — Emergency Department (HOSPITAL_BASED_OUTPATIENT_CLINIC_OR_DEPARTMENT_OTHER): Payer: Medicare HMO

## 2020-11-04 ENCOUNTER — Encounter (HOSPITAL_BASED_OUTPATIENT_CLINIC_OR_DEPARTMENT_OTHER): Payer: Self-pay | Admitting: Emergency Medicine

## 2020-11-04 VITALS — BP 150/94 | HR 90

## 2020-11-04 DIAGNOSIS — Z8541 Personal history of malignant neoplasm of cervix uteri: Secondary | ICD-10-CM | POA: Insufficient documentation

## 2020-11-04 DIAGNOSIS — R200 Anesthesia of skin: Secondary | ICD-10-CM

## 2020-11-04 DIAGNOSIS — I471 Supraventricular tachycardia: Secondary | ICD-10-CM | POA: Diagnosis not present

## 2020-11-04 DIAGNOSIS — R0602 Shortness of breath: Secondary | ICD-10-CM | POA: Diagnosis not present

## 2020-11-04 DIAGNOSIS — N644 Mastodynia: Secondary | ICD-10-CM

## 2020-11-04 DIAGNOSIS — R1011 Right upper quadrant pain: Secondary | ICD-10-CM | POA: Insufficient documentation

## 2020-11-04 DIAGNOSIS — Z79899 Other long term (current) drug therapy: Secondary | ICD-10-CM | POA: Diagnosis not present

## 2020-11-04 DIAGNOSIS — K219 Gastro-esophageal reflux disease without esophagitis: Secondary | ICD-10-CM | POA: Insufficient documentation

## 2020-11-04 DIAGNOSIS — Z8616 Personal history of COVID-19: Secondary | ICD-10-CM | POA: Diagnosis not present

## 2020-11-04 DIAGNOSIS — I10 Essential (primary) hypertension: Secondary | ICD-10-CM | POA: Diagnosis not present

## 2020-11-04 DIAGNOSIS — R9431 Abnormal electrocardiogram [ECG] [EKG]: Secondary | ICD-10-CM | POA: Diagnosis not present

## 2020-11-04 DIAGNOSIS — R109 Unspecified abdominal pain: Secondary | ICD-10-CM | POA: Diagnosis not present

## 2020-11-04 DIAGNOSIS — Z87891 Personal history of nicotine dependence: Secondary | ICD-10-CM | POA: Insufficient documentation

## 2020-11-04 DIAGNOSIS — J4530 Mild persistent asthma, uncomplicated: Secondary | ICD-10-CM | POA: Insufficient documentation

## 2020-11-04 DIAGNOSIS — R000 Tachycardia, unspecified: Secondary | ICD-10-CM | POA: Insufficient documentation

## 2020-11-04 LAB — URINALYSIS, ROUTINE W REFLEX MICROSCOPIC
Bilirubin Urine: NEGATIVE
Glucose, UA: NEGATIVE mg/dL
Hgb urine dipstick: NEGATIVE
Ketones, ur: NEGATIVE mg/dL
Leukocytes,Ua: NEGATIVE
Nitrite: NEGATIVE
Protein, ur: NEGATIVE mg/dL
Specific Gravity, Urine: 1.025 (ref 1.005–1.030)
pH: 6 (ref 5.0–8.0)

## 2020-11-04 LAB — COMPREHENSIVE METABOLIC PANEL
ALT: 20 U/L (ref 0–44)
AST: 16 U/L (ref 15–41)
Albumin: 3.5 g/dL (ref 3.5–5.0)
Alkaline Phosphatase: 62 U/L (ref 38–126)
Anion gap: 10 (ref 5–15)
BUN: 14 mg/dL (ref 6–20)
CO2: 22 mmol/L (ref 22–32)
Calcium: 8.7 mg/dL — ABNORMAL LOW (ref 8.9–10.3)
Chloride: 107 mmol/L (ref 98–111)
Creatinine, Ser: 0.69 mg/dL (ref 0.44–1.00)
GFR, Estimated: >60 mL/min (ref >60)
Glucose, Bld: 129 mg/dL — ABNORMAL HIGH (ref 70–99)
Potassium: 3.2 mmol/L — ABNORMAL LOW (ref 3.5–5.1)
Sodium: 139 mmol/L (ref 135–145)
Total Bilirubin: 0.3 mg/dL (ref 0.3–1.2)
Total Protein: 6.4 g/dL — ABNORMAL LOW (ref 6.5–8.1)

## 2020-11-04 LAB — CBC WITH DIFFERENTIAL/PLATELET
Abs Immature Granulocytes: 0.03 10*3/uL (ref 0.00–0.07)
Basophils Absolute: 0.1 10*3/uL (ref 0.0–0.1)
Basophils Relative: 1 %
Eosinophils Absolute: 0.2 10*3/uL (ref 0.0–0.5)
Eosinophils Relative: 3 %
HCT: 39.7 % (ref 36.0–46.0)
Hemoglobin: 12.9 g/dL (ref 12.0–15.0)
Immature Granulocytes: 0 %
Lymphocytes Relative: 22 %
Lymphs Abs: 1.6 10*3/uL (ref 0.7–4.0)
MCH: 28.4 pg (ref 26.0–34.0)
MCHC: 32.5 g/dL (ref 30.0–36.0)
MCV: 87.3 fL (ref 80.0–100.0)
Monocytes Absolute: 0.7 10*3/uL (ref 0.1–1.0)
Monocytes Relative: 10 %
Neutro Abs: 4.7 10*3/uL (ref 1.7–7.7)
Neutrophils Relative %: 64 %
Platelets: 189 10*3/uL (ref 150–400)
RBC: 4.55 MIL/uL (ref 3.87–5.11)
RDW: 15.6 % — ABNORMAL HIGH (ref 11.5–15.5)
WBC: 7.2 10*3/uL (ref 4.0–10.5)
nRBC: 0 % (ref 0.0–0.2)

## 2020-11-04 LAB — TROPONIN I (HIGH SENSITIVITY): Troponin I (High Sensitivity): 3 ng/L (ref <18)

## 2020-11-04 LAB — LIPASE, BLOOD: Lipase: 43 U/L (ref 11–51)

## 2020-11-04 MED ORDER — IOHEXOL 300 MG/ML  SOLN
100.0000 mL | Freq: Once | INTRAMUSCULAR | Status: AC | PRN
  Administered 2020-11-04: 100 mL via INTRAVENOUS

## 2020-11-04 NOTE — Discharge Instructions (Addendum)
You were seen in the emergency department for chronic shortness of breath, worsening upper abdominal pain, sweats and not feeling well in general.  You had lab work urinalysis and a CAT scan of your abdomen pelvis that did not show any significant abnormalities.  Please keep your appointments with your primary care and specialty doctors and follow-up with them.

## 2020-11-04 NOTE — Progress Notes (Signed)
Virtual Visit via Video Note  I connected with Jennifer [name] on 11/04/20 at  8:10 AM EDT by a video enabled telemedicine application and verified that I am speaking with the correct person using two identifiers.   I discussed the limitations of evaluation and management by telemedicine and the availability of in person appointments. The patient expressed understanding and agreed to proceed.  Patient location: at home Provider location: in office  Subjective:    CC: Right breast pain  HPI:  C/o of right breast pain when went to get her mammo so they called and needed [place] to order diagnostic. They were able to get her scheduled later today.  She also needs to have her heart monitor placed.  She does have transportation issues and is worried her husband may not get often time to actually take her to the appointment which is at 4:00 today and that she may have to reschedule.  This in turn will then delay her being able to wear heart monitor.  Getting numbness and tingling in her face and feeling loopy.  Especially today but has been coming and going.  Rusher still elevated today though she has been taking her metoprolol but says it is not as high as it has been.  Past medical history, Surgical history, Family history not pertinant except as noted below, Social history, Allergies, and medications have been entered into the medical record, reviewed, and corrections made.   Review of Systems: No fevers, chills, night sweats, weight loss, chest pain, or shortness of breath.   Objective:    General: Speaking clearly in complete sentences without any shortness of breath.  Alert and oriented x3.  Normal judgment. No apparent acute distress.    Impression and Recommendations:    No problem-specific Assessment & Plan notes found for this encounter.  Right breast pain - scheduled for diagnostic today but doesn't know if she will have transportation.  Hopefully this will work out but if not she  may have to reschedule for the next Friday which will delay her getting some type of heart monitor placed.  Palpitations/SVT-planning on hopefully getting a loop recorder placed.  Still waiting to hear back from the cardiology's office but again may have to be delayed  Facial numbness/paresthesias-I think this would be best addressed by the neurologist.  At one time she was diagnosed with MS.  Given she has a lot going on right now so certainly this is something that could be addressed later this year.  She is already tried to check with the insurance to help with transportation and has tried to use them in the past but unfortunately they were not very reliable.   Time spent in encounter 21 minutes  I discussed the assessment and treatment plan with the patient. The patient was provided an opportunity to ask questions and all were answered. The patient agreed with the plan and demonstrated an understanding of the instructions.   The patient was advised to call back or seek an in-person evaluation if the symptoms worsen or if the condition fails to improve as anticipated.   Beatrice Lecher, MD

## 2020-11-04 NOTE — ED Triage Notes (Signed)
Pt c/o feeling like she can't take a deep breath with asthma that isnt controled. Pt states that she has been seen for this multiple times. Pt c/o abd and back pain that flared up today. Pt denies any lifting trauma at this time. Pt aaox3, ambulatory with steady gait and no respiratory distress noted. Pt VSS, GCS 15.
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
## 2020-11-04 NOTE — ED Notes (Signed)
Pt tearful during assessment, attempted to comfort. Denies any needs right now, c/o RUQ pain. Denied warm blanket at this time.

## 2020-11-04 NOTE — ED Notes (Signed)
Patient transported to CT 

## 2020-11-04 NOTE — Progress Notes (Signed)
Not feeling well. "I feel loopy headed"

## 2020-11-05 DIAGNOSIS — M545 Low back pain, unspecified: Secondary | ICD-10-CM | POA: Diagnosis not present

## 2020-11-05 DIAGNOSIS — M542 Cervicalgia: Secondary | ICD-10-CM | POA: Diagnosis not present

## 2020-11-05 DIAGNOSIS — G8929 Other chronic pain: Secondary | ICD-10-CM | POA: Diagnosis not present

## 2020-11-05 DIAGNOSIS — K224 Dyskinesia of esophagus: Secondary | ICD-10-CM | POA: Diagnosis not present

## 2020-11-05 DIAGNOSIS — Z5309 Procedure and treatment not carried out because of other contraindication: Secondary | ICD-10-CM | POA: Diagnosis not present

## 2020-11-06 DIAGNOSIS — R0602 Shortness of breath: Secondary | ICD-10-CM | POA: Diagnosis not present

## 2020-11-06 DIAGNOSIS — R109 Unspecified abdominal pain: Secondary | ICD-10-CM | POA: Diagnosis not present

## 2020-11-06 DIAGNOSIS — R1084 Generalized abdominal pain: Secondary | ICD-10-CM | POA: Diagnosis not present

## 2020-11-08 DIAGNOSIS — R002 Palpitations: Secondary | ICD-10-CM | POA: Diagnosis not present

## 2020-11-08 DIAGNOSIS — I4589 Other specified conduction disorders: Secondary | ICD-10-CM | POA: Diagnosis not present

## 2020-11-08 DIAGNOSIS — R0609 Other forms of dyspnea: Secondary | ICD-10-CM | POA: Diagnosis not present

## 2020-11-08 DIAGNOSIS — R0600 Dyspnea, unspecified: Secondary | ICD-10-CM | POA: Diagnosis not present

## 2020-11-10 ENCOUNTER — Encounter: Payer: Self-pay | Admitting: Family Medicine

## 2020-11-10 ENCOUNTER — Other Ambulatory Visit: Payer: Self-pay

## 2020-11-10 DIAGNOSIS — I10 Essential (primary) hypertension: Secondary | ICD-10-CM | POA: Diagnosis not present

## 2020-11-10 DIAGNOSIS — R457 State of emotional shock and stress, unspecified: Secondary | ICD-10-CM | POA: Diagnosis not present

## 2020-11-10 DIAGNOSIS — R450 Nervousness: Secondary | ICD-10-CM | POA: Diagnosis not present

## 2020-11-10 DIAGNOSIS — R002 Palpitations: Secondary | ICD-10-CM | POA: Diagnosis not present

## 2020-11-10 DIAGNOSIS — R000 Tachycardia, unspecified: Secondary | ICD-10-CM | POA: Diagnosis not present

## 2020-11-10 DIAGNOSIS — R079 Chest pain, unspecified: Secondary | ICD-10-CM | POA: Diagnosis not present

## 2020-11-10 LAB — BASIC METABOLIC PANEL
BUN: 13 (ref 4–21)
CO2: 26 — AB (ref 13–22)
Chloride: 107 (ref 99–108)
Creatinine: 0.6 (ref 0.5–1.1)
Glucose: 115
Potassium: 4 (ref 3.4–5.3)
Sodium: 139 (ref 137–147)

## 2020-11-10 LAB — COMPREHENSIVE METABOLIC PANEL
Calcium: 9.4 (ref 8.7–10.7)
GFR calc non Af Amer: >90

## 2020-11-10 MED ORDER — METOPROLOL TARTRATE 25 MG PO TABS: ORAL_TABLET | ORAL | 0 refills | Status: DC

## 2020-11-11 ENCOUNTER — Encounter: Payer: Self-pay | Admitting: Family Medicine

## 2020-11-12 ENCOUNTER — Ambulatory Visit: Payer: Medicare HMO | Admitting: Family Medicine

## 2020-11-12 DIAGNOSIS — R9431 Abnormal electrocardiogram [ECG] [EKG]: Secondary | ICD-10-CM | POA: Diagnosis not present

## 2020-11-12 DIAGNOSIS — J45909 Unspecified asthma, uncomplicated: Secondary | ICD-10-CM | POA: Diagnosis not present

## 2020-11-12 DIAGNOSIS — I10 Essential (primary) hypertension: Secondary | ICD-10-CM | POA: Diagnosis not present

## 2020-11-12 DIAGNOSIS — R002 Palpitations: Secondary | ICD-10-CM | POA: Diagnosis not present

## 2020-11-12 DIAGNOSIS — K219 Gastro-esophageal reflux disease without esophagitis: Secondary | ICD-10-CM | POA: Diagnosis not present

## 2020-11-12 DIAGNOSIS — Z8542 Personal history of malignant neoplasm of other parts of uterus: Secondary | ICD-10-CM | POA: Diagnosis not present

## 2020-11-12 DIAGNOSIS — E785 Hyperlipidemia, unspecified: Secondary | ICD-10-CM | POA: Diagnosis not present

## 2020-11-12 DIAGNOSIS — Z8589 Personal history of malignant neoplasm of other organs and systems: Secondary | ICD-10-CM | POA: Diagnosis not present

## 2020-11-12 DIAGNOSIS — R079 Chest pain, unspecified: Secondary | ICD-10-CM | POA: Diagnosis not present

## 2020-11-12 DIAGNOSIS — I471 Supraventricular tachycardia: Secondary | ICD-10-CM | POA: Diagnosis not present

## 2020-11-12 DIAGNOSIS — G35 Multiple sclerosis: Secondary | ICD-10-CM | POA: Diagnosis not present

## 2020-11-14 DIAGNOSIS — E876 Hypokalemia: Secondary | ICD-10-CM | POA: Diagnosis not present

## 2020-11-14 DIAGNOSIS — I10 Essential (primary) hypertension: Secondary | ICD-10-CM | POA: Diagnosis not present

## 2020-11-14 DIAGNOSIS — R002 Palpitations: Secondary | ICD-10-CM | POA: Diagnosis not present

## 2020-11-14 DIAGNOSIS — Z87891 Personal history of nicotine dependence: Secondary | ICD-10-CM | POA: Diagnosis not present

## 2020-11-14 DIAGNOSIS — M7989 Other specified soft tissue disorders: Secondary | ICD-10-CM | POA: Diagnosis not present

## 2020-11-14 DIAGNOSIS — D72819 Decreased white blood cell count, unspecified: Secondary | ICD-10-CM | POA: Diagnosis not present

## 2020-11-14 DIAGNOSIS — R9431 Abnormal electrocardiogram [ECG] [EKG]: Secondary | ICD-10-CM | POA: Diagnosis not present

## 2020-11-14 DIAGNOSIS — R000 Tachycardia, unspecified: Secondary | ICD-10-CM | POA: Diagnosis not present

## 2020-11-14 DIAGNOSIS — R2242 Localized swelling, mass and lump, left lower limb: Secondary | ICD-10-CM | POA: Diagnosis not present

## 2020-11-14 DIAGNOSIS — R110 Nausea: Secondary | ICD-10-CM | POA: Diagnosis not present

## 2020-11-16 ENCOUNTER — Encounter: Payer: Self-pay | Admitting: Family Medicine

## 2020-11-16 ENCOUNTER — Telehealth: Payer: Self-pay | Admitting: *Deleted

## 2020-11-16 ENCOUNTER — Telehealth (INDEPENDENT_AMBULATORY_CARE_PROVIDER_SITE_OTHER): Payer: Medicare HMO | Admitting: Family Medicine

## 2020-11-16 DIAGNOSIS — D1722 Benign lipomatous neoplasm of skin and subcutaneous tissue of left arm: Secondary | ICD-10-CM | POA: Diagnosis not present

## 2020-11-16 DIAGNOSIS — D72819 Decreased white blood cell count, unspecified: Secondary | ICD-10-CM

## 2020-11-16 DIAGNOSIS — R899 Unspecified abnormal finding in specimens from other organs, systems and tissues: Secondary | ICD-10-CM | POA: Diagnosis not present

## 2020-11-16 DIAGNOSIS — M79602 Pain in left arm: Secondary | ICD-10-CM

## 2020-11-16 DIAGNOSIS — E876 Hypokalemia: Secondary | ICD-10-CM | POA: Diagnosis not present

## 2020-11-16 DIAGNOSIS — I10 Essential (primary) hypertension: Secondary | ICD-10-CM | POA: Diagnosis not present

## 2020-11-16 DIAGNOSIS — R002 Palpitations: Secondary | ICD-10-CM | POA: Diagnosis not present

## 2020-11-16 DIAGNOSIS — F419 Anxiety disorder, unspecified: Secondary | ICD-10-CM | POA: Diagnosis not present

## 2020-11-16 DIAGNOSIS — I491 Atrial premature depolarization: Secondary | ICD-10-CM | POA: Diagnosis not present

## 2020-11-16 DIAGNOSIS — I479 Paroxysmal tachycardia, unspecified: Secondary | ICD-10-CM | POA: Diagnosis not present

## 2020-11-16 DIAGNOSIS — Z538 Procedure and treatment not carried out for other reasons: Secondary | ICD-10-CM | POA: Diagnosis not present

## 2020-11-16 DIAGNOSIS — Z8616 Personal history of COVID-19: Secondary | ICD-10-CM | POA: Diagnosis not present

## 2020-11-16 DIAGNOSIS — Z8249 Family history of ischemic heart disease and other diseases of the circulatory system: Secondary | ICD-10-CM | POA: Diagnosis not present

## 2020-11-16 DIAGNOSIS — R0602 Shortness of breath: Secondary | ICD-10-CM | POA: Diagnosis not present

## 2020-11-16 DIAGNOSIS — R079 Chest pain, unspecified: Secondary | ICD-10-CM | POA: Diagnosis not present

## 2020-11-16 DIAGNOSIS — R0789 Other chest pain: Secondary | ICD-10-CM | POA: Diagnosis not present

## 2020-11-16 DIAGNOSIS — R2232 Localized swelling, mass and lump, left upper limb: Secondary | ICD-10-CM | POA: Diagnosis not present

## 2020-11-16 DIAGNOSIS — M79622 Pain in left upper arm: Secondary | ICD-10-CM | POA: Diagnosis not present

## 2020-11-16 DIAGNOSIS — I471 Supraventricular tachycardia: Secondary | ICD-10-CM | POA: Diagnosis not present

## 2020-11-16 MED ORDER — ROSUVASTATIN CALCIUM 5 MG PO TABS: 5.0000 mg | ORAL_TABLET | ORAL | 0 refills | Status: DC

## 2020-11-16 NOTE — Progress Notes (Signed)
lvm advising pt that I was calling to get her prescreening and to let her know that Dr. Madilyn Fireman is running late.  Pt reports that she was seen by the cardiologist today and they are planning to change up her medications. She is back on another heart monitor.  She reports that this is the same arm that she had an IV done in previously. She asked the staff in the ED to check this area for her.   Above her elbow, on the inside going up into her armpit. Pain in the antecubital area.   She has wrapped it and tried tylenol,advil warm shower and none of this has helped her.   Pt also stated that her WBC is below nl and this will need to be monitored. She asked if she can get the order sent over to [facility] to be drawn. She is going TODAY to have other labs done.

## 2020-11-16 NOTE — Progress Notes (Signed)
Virtual Visit via Video Note  I connected with Jennifer [name] on 11/16/20 at 10:10 AM EDT by a video enabled telemedicine application and verified that I am speaking with the correct person using two identifiers.   I discussed the limitations of evaluation and management by telemedicine and the availability of in person appointments. The patient expressed understanding and agreed to proceed.  Patient location:at home  Provider location: in office  Subjective:    CC: Arm swelling, left side  HPI:  Pt reports that she was seen by the cardiologist today and they are planning to change up her medications. She is back on another heart monitor.  She reports that this is the same arm that she had an IV done in previously. She asked the staff in the ED to check this area for her.  Above her elbow, on the inside going up into her armpit. Pain in the antecubital area.   She has wrapped it and tried tylenol,advil warm shower and none of this has helped her. The area feels knotty and painful.    Pt also stated that her WBC is below nl and this will need to be monitored. She asked if she can get the order sent over to [facility] to be drawn. She is going TODAY to have other labs done.   She did have some swelling in her lower legs with some pitting.  They did do a left lower ext doppler.   Hypokalemia - she hasn't been taking the potassium daily but it was low gain recently. She is planning on going to the lab to recheck it.    Past medical history, Surgical history, Family history not pertinant except as noted below, Social history, Allergies, and medications have been entered into the medical record, reviewed, and corrections made.   Review of Systems: No fevers, chills, night sweats, weight loss, chest pain, or shortness of breath.   Objective:    General: Speaking clearly in complete sentences without any shortness of breath.  Alert and oriented x3.  Normal judgment. No apparent acute  distress.  Impression and Recommendations:    No problem-specific Assessment & Plan notes found for this encounter. Left and upper inner arm swelling and increased warmth and pain-we will get a Doppler scheduled to evaluate for possible blood clot.  She also could just have phlebitis as it sounds like it does go from the antecubital fossa all the way up to the axilla but she is not palpating any swollen lymph nodes which is very reassuring.  No streaking redness or rash on the skin which is also reassuring.  Just continue with compression and anti-inflammatory as much as she is able to tolerate.  Leukopenia-unclear etiology it looks like about a week ago she had a normal white blood cell count and then 2 days ago had a borderline low count.  This could be secondary to the blood draw itself or a viral infection.  At this point no worrisome findings.  She is planning on going back to the lab to repeat it today but I would encourage her to also consider rechecking in 2 weeks to really get a better sense of which way the number is trending.  Hypokalemia-she is not taking potassium daily I really want her to get on 20 mEq daily which is equal to 7.5 mL of her current liquid regimen.  Instead of waiting until the potassium goes low to start taking the potassium again.   Time spent in encounter 22  minutes  I discussed the assessment and treatment plan with the patient. The patient was provided an opportunity to ask questions and all were answered. The patient agreed with the plan and demonstrated an understanding of the instructions.   The patient was advised to call back or seek an in-person evaluation if the symptoms worsen or if the condition fails to improve as anticipated.   Beatrice Lecher, MD

## 2020-11-17 DIAGNOSIS — I10 Essential (primary) hypertension: Secondary | ICD-10-CM | POA: Diagnosis not present

## 2020-11-17 DIAGNOSIS — R002 Palpitations: Secondary | ICD-10-CM | POA: Diagnosis not present

## 2020-11-17 DIAGNOSIS — F419 Anxiety disorder, unspecified: Secondary | ICD-10-CM | POA: Diagnosis not present

## 2020-11-17 DIAGNOSIS — R61 Generalized hyperhidrosis: Secondary | ICD-10-CM | POA: Diagnosis not present

## 2020-11-17 DIAGNOSIS — R0789 Other chest pain: Secondary | ICD-10-CM | POA: Diagnosis not present

## 2020-11-17 DIAGNOSIS — R140 Abdominal distension (gaseous): Secondary | ICD-10-CM | POA: Diagnosis not present

## 2020-11-17 DIAGNOSIS — R110 Nausea: Secondary | ICD-10-CM | POA: Diagnosis not present

## 2020-11-17 DIAGNOSIS — R079 Chest pain, unspecified: Secondary | ICD-10-CM | POA: Diagnosis not present

## 2020-11-17 DIAGNOSIS — R000 Tachycardia, unspecified: Secondary | ICD-10-CM | POA: Diagnosis not present

## 2020-11-17 DIAGNOSIS — R0602 Shortness of breath: Secondary | ICD-10-CM | POA: Diagnosis not present

## 2020-11-18 DIAGNOSIS — R079 Chest pain, unspecified: Secondary | ICD-10-CM | POA: Diagnosis not present

## 2020-11-19 ENCOUNTER — Other Ambulatory Visit: Payer: Self-pay

## 2020-11-19 ENCOUNTER — Ambulatory Visit (INDEPENDENT_AMBULATORY_CARE_PROVIDER_SITE_OTHER): Payer: Medicare HMO | Admitting: Family Medicine

## 2020-11-19 ENCOUNTER — Encounter: Payer: Self-pay | Admitting: Family Medicine

## 2020-11-19 VITALS — BP 143/76 | HR 87 | Ht 63.0 in | Wt 219.0 lb

## 2020-11-19 DIAGNOSIS — E785 Hyperlipidemia, unspecified: Secondary | ICD-10-CM | POA: Diagnosis not present

## 2020-11-19 DIAGNOSIS — D179 Benign lipomatous neoplasm, unspecified: Secondary | ICD-10-CM

## 2020-11-19 DIAGNOSIS — I10 Essential (primary) hypertension: Secondary | ICD-10-CM | POA: Diagnosis not present

## 2020-11-19 DIAGNOSIS — E119 Type 2 diabetes mellitus without complications: Secondary | ICD-10-CM | POA: Diagnosis not present

## 2020-11-19 DIAGNOSIS — F418 Other specified anxiety disorders: Secondary | ICD-10-CM

## 2020-11-19 DIAGNOSIS — L245 Irritant contact dermatitis due to other chemical products: Secondary | ICD-10-CM | POA: Diagnosis not present

## 2020-11-19 DIAGNOSIS — I471 Supraventricular tachycardia: Secondary | ICD-10-CM | POA: Diagnosis not present
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
## 2020-11-19 NOTE — Assessment & Plan Note (Signed)
She has been encouraged to go ahead and start the hydralazine and I did verify with the pharmacist that it can be split even though it is not scored.

## 2020-11-19 NOTE — Assessment & Plan Note (Signed)
Just encouraged her to call behavioral health back and see if maybe they could mail her the forms since it does not seem to be coming through email.  We can also check on that early next week.

## 2020-11-22 DIAGNOSIS — R9431 Abnormal electrocardiogram [ECG] [EKG]: Secondary | ICD-10-CM | POA: Diagnosis not present

## 2020-11-22 DIAGNOSIS — Z20822 Contact with and (suspected) exposure to covid-19: Secondary | ICD-10-CM | POA: Diagnosis not present

## 2020-11-22 DIAGNOSIS — R002 Palpitations: Secondary | ICD-10-CM | POA: Diagnosis not present

## 2020-11-22 DIAGNOSIS — M79602 Pain in left arm: Secondary | ICD-10-CM | POA: Diagnosis not present

## 2020-11-22 DIAGNOSIS — R1310 Dysphagia, unspecified: Secondary | ICD-10-CM | POA: Diagnosis not present

## 2020-11-22 DIAGNOSIS — R0600 Dyspnea, unspecified: Secondary | ICD-10-CM | POA: Diagnosis not present

## 2020-11-22 DIAGNOSIS — D709 Neutropenia, unspecified: Secondary | ICD-10-CM | POA: Diagnosis not present

## 2020-11-22 DIAGNOSIS — J029 Acute pharyngitis, unspecified: Secondary | ICD-10-CM | POA: Diagnosis not present

## 2020-11-22 DIAGNOSIS — E876 Hypokalemia: Secondary | ICD-10-CM | POA: Diagnosis not present

## 2020-11-22 LAB — CBC AND DIFFERENTIAL
HCT: 42 (ref 36–46)
Hemoglobin: 14.1 (ref 12.0–16.0)
Neutrophils Absolute: 69
Platelets: 191 (ref 150–399)
WBC: 6

## 2020-11-22 LAB — CBC: RBC: 4.85 (ref 3.87–5.11)

## 2020-11-23 DIAGNOSIS — R9431 Abnormal electrocardiogram [ECG] [EKG]: Secondary | ICD-10-CM | POA: Diagnosis not present

## 2020-11-24 IMAGING — DX DG ABDOMEN ACUTE W/ 1V CHEST
4 series · 4 of 4 positions shown · non-contrast
Comparison: June 26, 2019

CLINICAL DATA: Epigastric pain with bloating and shortness of
breath.

EXAM:
DG ABDOMEN ACUTE W/ 1V CHEST

[chest pa]
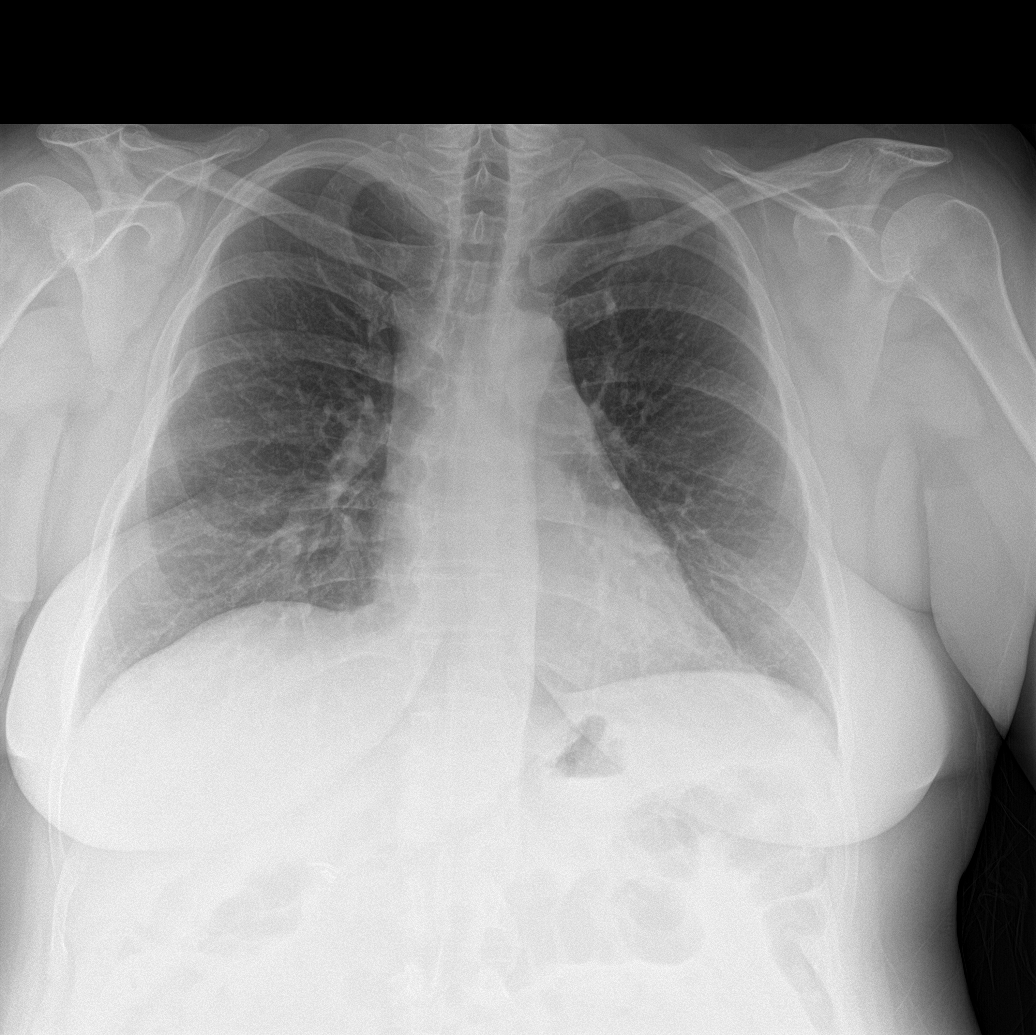

[abdomen erect]
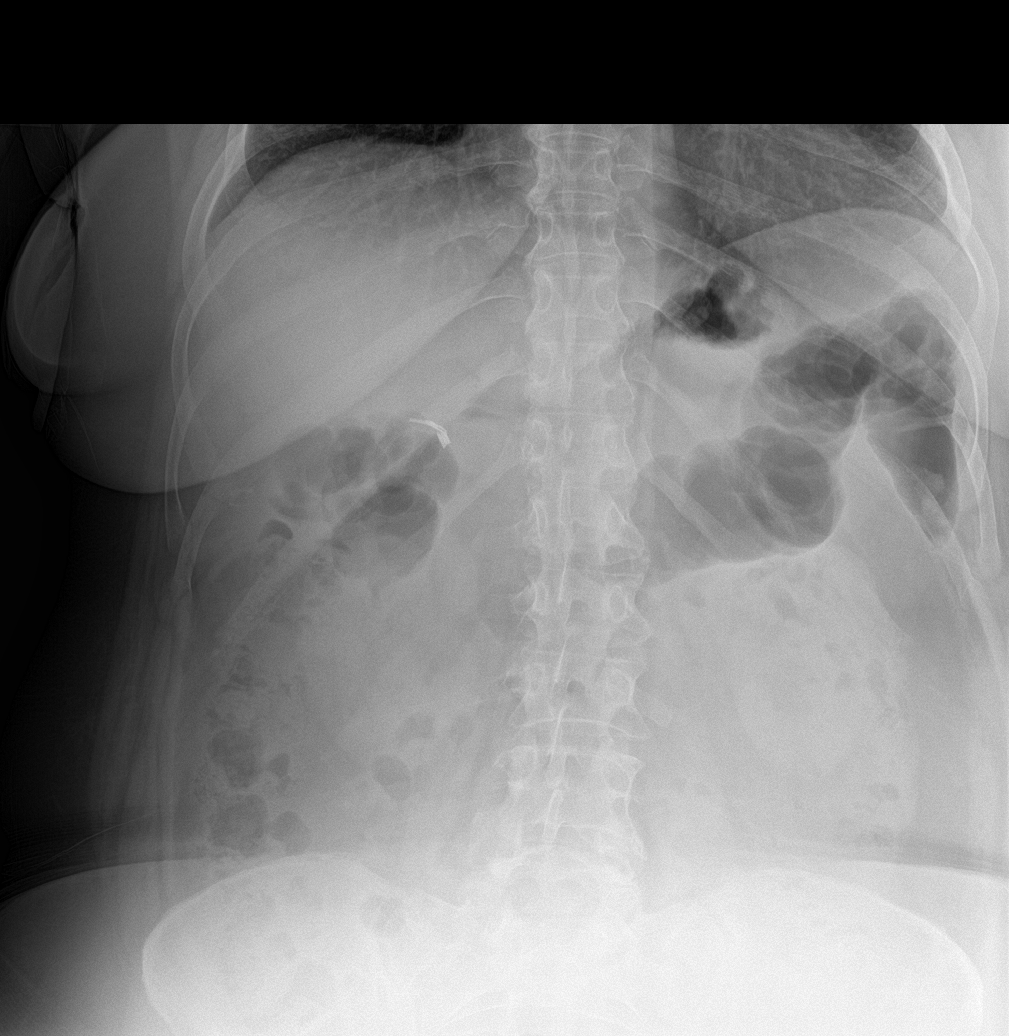

[abdomen supine]
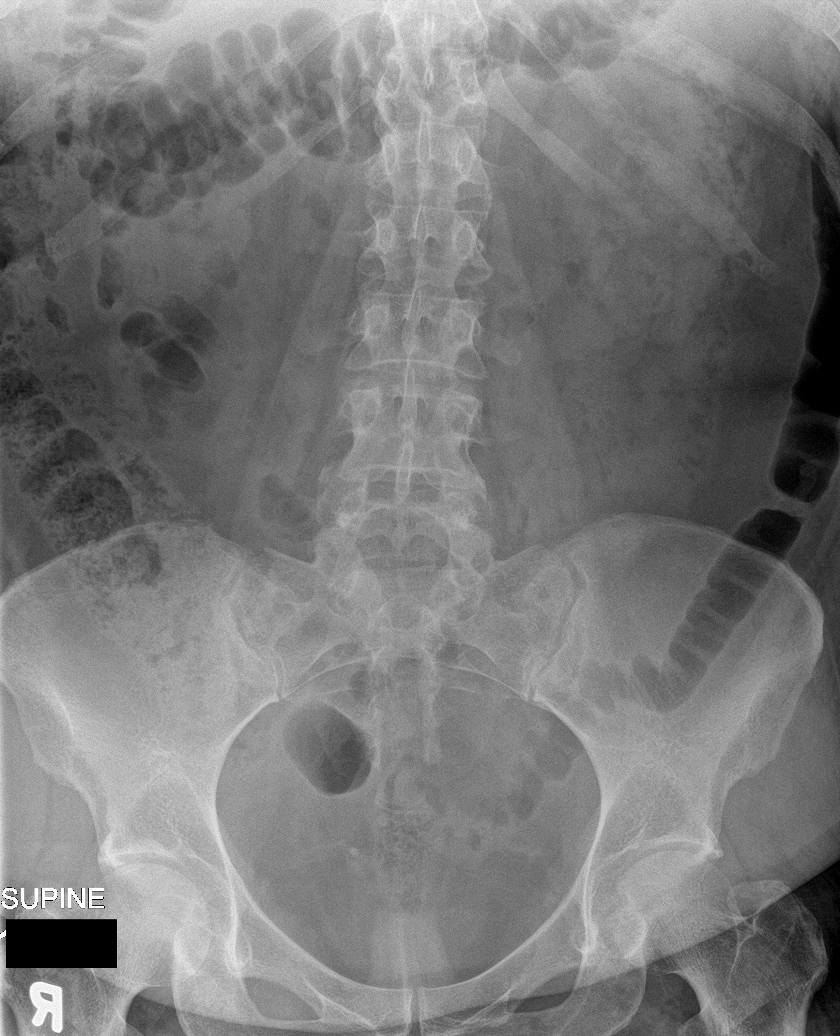

[abdomen kub]
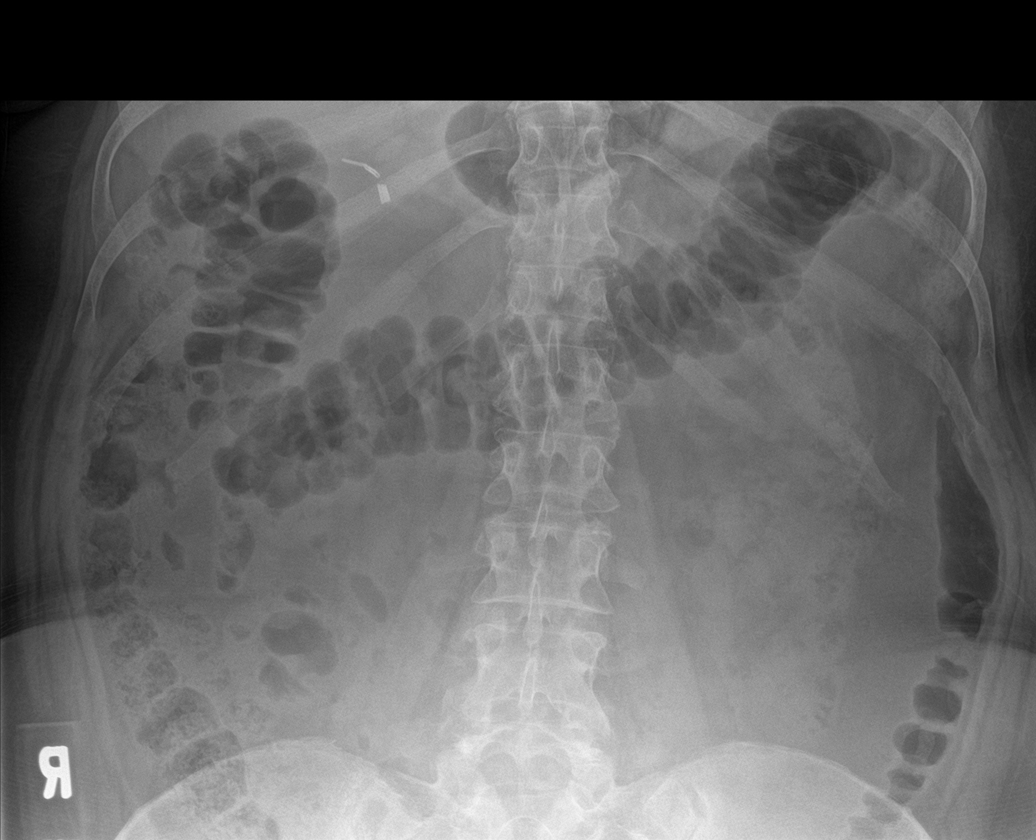

[4 of 4 positions shown; findings below may reference images not displayed]

FINDINGS: There is no evidence of dilated bowel loops or free intraperitoneal
air. No radiopaque calculi or other significant radiographic
abnormality is seen. Radiopaque surgical clips are seen overlying
the right upper quadrant. A moderate to marked amount of stool is
seen within the ascending colon. Heart size and mediastinal contours
are within normal limits. Both lungs are clear. A chronic 6th right
rib fracture is seen.
IMPRESSION: 1. No acute or active cardiopulmonary disease.
2. No evidence of bowel obstruction.
3. Evidence of prior cholecystectomy.

## 2020-11-25 DIAGNOSIS — Z87891 Personal history of nicotine dependence: Secondary | ICD-10-CM | POA: Diagnosis not present

## 2020-11-25 DIAGNOSIS — J4520 Mild intermittent asthma, uncomplicated: Secondary | ICD-10-CM | POA: Diagnosis not present

## 2020-11-25 DIAGNOSIS — J069 Acute upper respiratory infection, unspecified: Secondary | ICD-10-CM | POA: Diagnosis not present

## 2020-11-30 DIAGNOSIS — H538 Other visual disturbances: Secondary | ICD-10-CM | POA: Diagnosis not present

## 2020-11-30 DIAGNOSIS — H532 Diplopia: Secondary | ICD-10-CM | POA: Diagnosis not present

## 2020-11-30 DIAGNOSIS — Z79899 Other long term (current) drug therapy: Secondary | ICD-10-CM | POA: Diagnosis not present

## 2020-11-30 DIAGNOSIS — R519 Headache, unspecified: Secondary | ICD-10-CM | POA: Diagnosis not present

## 2020-11-30 LAB — BASIC METABOLIC PANEL
BUN: 15 (ref 4–21)
CO2: 25 — AB (ref 13–22)
Chloride: 108 (ref 99–108)
Creatinine: 0.7 (ref 0.5–1.1)
Glucose: 144
Potassium: 3.7 (ref 3.4–5.3)
Sodium: 140 (ref 137–147)

## 2020-11-30 LAB — COMPREHENSIVE METABOLIC PANEL: Calcium: 9 (ref 8.7–10.7)

## 2020-12-01 ENCOUNTER — Encounter: Payer: Self-pay | Admitting: Neurology

## 2020-12-01 DIAGNOSIS — R000 Tachycardia, unspecified: Secondary | ICD-10-CM | POA: Diagnosis not present

## 2020-12-01 DIAGNOSIS — R1310 Dysphagia, unspecified: Secondary | ICD-10-CM | POA: Diagnosis not present

## 2020-12-01 DIAGNOSIS — R0989 Other specified symptoms and signs involving the circulatory and respiratory systems: Secondary | ICD-10-CM | POA: Diagnosis not present

## 2020-12-02 ENCOUNTER — Emergency Department (HOSPITAL_BASED_OUTPATIENT_CLINIC_OR_DEPARTMENT_OTHER): Payer: Medicare HMO

## 2020-12-02 ENCOUNTER — Other Ambulatory Visit: Payer: Self-pay

## 2020-12-02 ENCOUNTER — Emergency Department (HOSPITAL_BASED_OUTPATIENT_CLINIC_OR_DEPARTMENT_OTHER)
Admission: EM | Admit: 2020-12-02 | Discharge: 2020-12-02 | Disposition: A | Discharge status: Home / Self Care | Payer: Medicare HMO | Attending: Emergency Medicine | Admitting: Emergency Medicine

## 2020-12-02 ENCOUNTER — Encounter (HOSPITAL_BASED_OUTPATIENT_CLINIC_OR_DEPARTMENT_OTHER): Payer: Self-pay

## 2020-12-02 DIAGNOSIS — R0602 Shortness of breath: Secondary | ICD-10-CM | POA: Diagnosis not present

## 2020-12-02 DIAGNOSIS — R079 Chest pain, unspecified: Secondary | ICD-10-CM | POA: Insufficient documentation

## 2020-12-02 DIAGNOSIS — R0789 Other chest pain: Secondary | ICD-10-CM | POA: Diagnosis not present

## 2020-12-02 DIAGNOSIS — R1310 Dysphagia, unspecified: Secondary | ICD-10-CM | POA: Insufficient documentation

## 2020-12-02 DIAGNOSIS — R0609 Other forms of dyspnea: Secondary | ICD-10-CM | POA: Diagnosis not present

## 2020-12-02 DIAGNOSIS — Z87891 Personal history of nicotine dependence: Secondary | ICD-10-CM | POA: Insufficient documentation

## 2020-12-02 DIAGNOSIS — J069 Acute upper respiratory infection, unspecified: Secondary | ICD-10-CM

## 2020-12-02 DIAGNOSIS — J45909 Unspecified asthma, uncomplicated: Secondary | ICD-10-CM | POA: Diagnosis not present

## 2020-12-02 DIAGNOSIS — R0981 Nasal congestion: Secondary | ICD-10-CM | POA: Insufficient documentation

## 2020-12-02 DIAGNOSIS — Z8542 Personal history of malignant neoplasm of other parts of uterus: Secondary | ICD-10-CM | POA: Diagnosis not present

## 2020-12-02 DIAGNOSIS — Z79899 Other long term (current) drug therapy: Secondary | ICD-10-CM | POA: Diagnosis not present

## 2020-12-02 DIAGNOSIS — R059 Cough, unspecified: Secondary | ICD-10-CM | POA: Insufficient documentation

## 2020-12-02 DIAGNOSIS — I10 Essential (primary) hypertension: Secondary | ICD-10-CM | POA: Diagnosis not present

## 2020-12-02 LAB — BASIC METABOLIC PANEL
Anion gap: 11 (ref 5–15)
BUN: 15 mg/dL (ref 6–20)
CO2: 22 mmol/L (ref 22–32)
Calcium: 9.1 mg/dL (ref 8.9–10.3)
Chloride: 108 mmol/L (ref 98–111)
Creatinine, Ser: 0.65 mg/dL (ref 0.44–1.00)
GFR, Estimated: >60 mL/min (ref >60)
Glucose, Bld: 127 mg/dL — ABNORMAL HIGH (ref 70–99)
Potassium: 3.9 mmol/L (ref 3.5–5.1)
Sodium: 141 mmol/L (ref 135–145)

## 2020-12-02 LAB — CBC WITH DIFFERENTIAL/PLATELET
Abs Immature Granulocytes: 0.03 10*3/uL (ref 0.00–0.07)
Basophils Absolute: 0.1 10*3/uL (ref 0.0–0.1)
Basophils Relative: 1 %
Eosinophils Absolute: 0.2 10*3/uL (ref 0.0–0.5)
Eosinophils Relative: 2 %
HCT: 43.3 % (ref 36.0–46.0)
Hemoglobin: 14.2 g/dL (ref 12.0–15.0)
Immature Granulocytes: 0 %
Lymphocytes Relative: 24 %
Lymphs Abs: 1.8 10*3/uL (ref 0.7–4.0)
MCH: 29.3 pg (ref 26.0–34.0)
MCHC: 32.8 g/dL (ref 30.0–36.0)
MCV: 89.5 fL (ref 80.0–100.0)
Monocytes Absolute: 0.7 10*3/uL (ref 0.1–1.0)
Monocytes Relative: 10 %
Neutro Abs: 4.8 10*3/uL (ref 1.7–7.7)
Neutrophils Relative %: 63 %
Platelets: 187 10*3/uL (ref 150–400)
RBC: 4.84 MIL/uL (ref 3.87–5.11)
RDW: 14.9 % (ref 11.5–15.5)
WBC: 7.6 10*3/uL (ref 4.0–10.5)
nRBC: 0 % (ref 0.0–0.2)

## 2020-12-02 LAB — D-DIMER, QUANTITATIVE: D-Dimer, Quant: 0.42 ug/mL-FEU (ref 0.00–0.50)

## 2020-12-02 NOTE — ED Notes (Signed)
Patient refused covid test

## 2020-12-02 NOTE — ED Triage Notes (Addendum)
Pt c/o chest tightness, cough, SOB-states "I have asthma but it got really bad at 6am"-NAD-steady gait-pt states she is wearing a heart monitor that was placed 4/1

## 2020-12-02 NOTE — Discharge Instructions (Signed)
You have an upper respiratory infection.  You do not have a blood clot and you have not aspirated.  Please follow-up with your primary care physician tomorrow

## 2020-12-02 NOTE — ED Notes (Signed)
Ambulated patient per order. Patient resting HR 98/ oxygen saturation of 98%. RR 17. Patient ambulated in room via pulse ox. Patient maintained gait without difficulty. Patient oxygen saturation post ambulation 100%/ HR 93. Patient tolerated well.Patient placed back on monitor.

## 2020-12-02 NOTE — ED Notes (Signed)
Pt refused COVID test 

## 2020-12-02 NOTE — ED Notes (Signed)
Waiting for MD/PA to see pt before drawing blood work  Pt was advised - has call bell  Pt refused gown, sheets and blankets r/t allergic to chemicals that we use to wash linens.
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
## 2020-12-03 ENCOUNTER — Telehealth (INDEPENDENT_AMBULATORY_CARE_PROVIDER_SITE_OTHER): Payer: Medicare HMO | Admitting: Family Medicine

## 2020-12-03 ENCOUNTER — Encounter: Payer: Self-pay | Admitting: Family Medicine

## 2020-12-03 VITALS — BP 148/81 | HR 97 | Ht 63.0 in | Wt 218.0 lb

## 2020-12-03 DIAGNOSIS — R000 Tachycardia, unspecified: Secondary | ICD-10-CM | POA: Diagnosis not present

## 2020-12-03 DIAGNOSIS — J069 Acute upper respiratory infection, unspecified: Secondary | ICD-10-CM | POA: Diagnosis not present

## 2020-12-03 DIAGNOSIS — F419 Anxiety disorder, unspecified: Secondary | ICD-10-CM | POA: Diagnosis not present

## 2020-12-03 DIAGNOSIS — R062 Wheezing: Secondary | ICD-10-CM | POA: Diagnosis not present

## 2020-12-03 DIAGNOSIS — J029 Acute pharyngitis, unspecified: Secondary | ICD-10-CM | POA: Diagnosis not present

## 2020-12-03 DIAGNOSIS — I10 Essential (primary) hypertension: Secondary | ICD-10-CM | POA: Diagnosis not present

## 2020-12-03 DIAGNOSIS — Z20822 Contact with and (suspected) exposure to covid-19: Secondary | ICD-10-CM | POA: Diagnosis not present

## 2020-12-03 DIAGNOSIS — Z8616 Personal history of COVID-19: Secondary | ICD-10-CM | POA: Diagnosis not present

## 2020-12-03 DIAGNOSIS — R0602 Shortness of breath: Secondary | ICD-10-CM | POA: Diagnosis not present

## 2020-12-03 DIAGNOSIS — R519 Headache, unspecified: Secondary | ICD-10-CM | POA: Diagnosis not present

## 2020-12-03 DIAGNOSIS — R112 Nausea with vomiting, unspecified: Secondary | ICD-10-CM | POA: Diagnosis not present

## 2020-12-03 NOTE — Progress Notes (Signed)
Virtual Visit via Video Note  I connected with Jennifer [name] on 12/08/20 at  1:40 PM EDT by a video enabled telemedicine application and verified that I am speaking with the correct person using two identifiers.   I discussed the limitations of evaluation and management by telemedicine and the availability of in person appointments. The patient expressed understanding and agreed to proceed.  Patient location: at home  Provider location: in office  Subjective:    CC: feels sick   HPI:  Pt called and stated that she would like to do a virtual appt instead today.   She stated that she is not feeling well for a couple of days , and went to the ED yesterday and today. She was neg for COVID and flu. She informed me that her granddaughter, and son were around her this week and they both had colds. She said that her husband is also sick now and told her that it was just allergies/sinus. Using her rescue inhaler some. Had some bread get stuck in her throat. Feels worse when she lays flat.    Pt sounded really hoarse, nasally,coughing and throat clearing. she has had a severe ST.   Says she really wants to get back in with Dr. Berdine Addison, Neurology as she feels her leg pain has been getting worse.    She was referred to pain management after seeing ortho for her back.  Using the walker more often and sometimes husband has to help her up. She feels a light chair would really help her.     She is still wearing her heart monitor.  So far they have seen SVT.  They are planning on loop recorder.   Mammogram schedule 5/5  Past medical history, Surgical history, Family history not pertinant except as noted below, Social history, Allergies, and medications have been entered into the medical record, reviewed, and corrections made.   Review of Systems: No fevers, chills, night sweats, weight loss, chest pain, or shortness of breath.   Objective:    General: Speaking clearly in complete sentences  without any shortness of breath.  Alert and oriented x3.  Normal judgment. No apparent acute distress.    Impression and Recommendations:    No problem-specific Assessment & Plan notes found for this encounter.  URI- recommend IBU for fever and pain relief. Continue freezer pops and work on hydration.   MS - would like a note for chair lift.  She says her legs are getting worse.    Bilat leg pain - work on getting back on with pain management and work on getting back with Dr. Berdine Addison.    Time spent in encounter 30 minutes  I discussed the assessment and treatment plan with the patient. The patient was provided an opportunity to ask questions and all were answered. The patient agreed with the plan and demonstrated an understanding of the instructions.   The patient was advised to call back or seek an in-person evaluation if the symptoms worsen or if the condition fails to improve as anticipated.   Jennifer Lecher, MD

## 2020-12-03 NOTE — Progress Notes (Signed)
Pt called and stated that she would like to do a virtual appt instead today.   She stated that she is not feeling well and was seen at the ED. She is being tested for COVID. She informed me that her granddaughter, and son were around her this week and they both had colds. She said that her husband is also sick now and told her that it was just allergies/sinus.   Pt sounded really hoarse, nasally,coughing and throat clearing.

## 2020-12-05 DIAGNOSIS — J069 Acute upper respiratory infection, unspecified: Secondary | ICD-10-CM | POA: Diagnosis not present

## 2020-12-05 DIAGNOSIS — R059 Cough, unspecified: Secondary | ICD-10-CM | POA: Diagnosis not present

## 2020-12-05 DIAGNOSIS — R0602 Shortness of breath: Secondary | ICD-10-CM | POA: Diagnosis not present
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
## 2020-12-06 DIAGNOSIS — R0602 Shortness of breath: Secondary | ICD-10-CM | POA: Diagnosis not present

## 2020-12-06 DIAGNOSIS — R0981 Nasal congestion: Secondary | ICD-10-CM | POA: Diagnosis not present

## 2020-12-06 DIAGNOSIS — R079 Chest pain, unspecified: Secondary | ICD-10-CM | POA: Diagnosis not present

## 2020-12-06 DIAGNOSIS — R059 Cough, unspecified: Secondary | ICD-10-CM | POA: Diagnosis not present

## 2020-12-06 DIAGNOSIS — R918 Other nonspecific abnormal finding of lung field: Secondary | ICD-10-CM | POA: Diagnosis not present

## 2020-12-06 DIAGNOSIS — I491 Atrial premature depolarization: Secondary | ICD-10-CM | POA: Diagnosis not present

## 2020-12-06 DIAGNOSIS — R000 Tachycardia, unspecified: Secondary | ICD-10-CM | POA: Diagnosis not present

## 2020-12-06 DIAGNOSIS — R0789 Other chest pain: Secondary | ICD-10-CM | POA: Diagnosis not present

## 2020-12-06 DIAGNOSIS — R42 Dizziness and giddiness: Secondary | ICD-10-CM | POA: Diagnosis not present

## 2020-12-07 DIAGNOSIS — R0602 Shortness of breath: Secondary | ICD-10-CM | POA: Diagnosis not present

## 2020-12-07 DIAGNOSIS — I491 Atrial premature depolarization: Secondary | ICD-10-CM | POA: Diagnosis not present

## 2020-12-07 DIAGNOSIS — R000 Tachycardia, unspecified: Secondary | ICD-10-CM | POA: Diagnosis not present

## 2020-12-08 DIAGNOSIS — J40 Bronchitis, not specified as acute or chronic: Secondary | ICD-10-CM | POA: Diagnosis not present

## 2020-12-08 DIAGNOSIS — I10 Essential (primary) hypertension: Secondary | ICD-10-CM | POA: Diagnosis not present

## 2020-12-08 DIAGNOSIS — J209 Acute bronchitis, unspecified: Secondary | ICD-10-CM | POA: Diagnosis not present

## 2020-12-08 DIAGNOSIS — Z79899 Other long term (current) drug therapy: Secondary | ICD-10-CM | POA: Diagnosis not present

## 2020-12-08 DIAGNOSIS — R0602 Shortness of breath: Secondary | ICD-10-CM | POA: Diagnosis not present

## 2020-12-08 DIAGNOSIS — J069 Acute upper respiratory infection, unspecified: Secondary | ICD-10-CM | POA: Diagnosis not present

## 2020-12-08 DIAGNOSIS — Z8542 Personal history of malignant neoplasm of other parts of uterus: Secondary | ICD-10-CM | POA: Diagnosis not present

## 2020-12-08 DIAGNOSIS — G4730 Sleep apnea, unspecified: Secondary | ICD-10-CM | POA: Diagnosis not present

## 2020-12-08 DIAGNOSIS — Z87891 Personal history of nicotine dependence: Secondary | ICD-10-CM | POA: Diagnosis not present

## 2020-12-08 DIAGNOSIS — E785 Hyperlipidemia, unspecified: Secondary | ICD-10-CM | POA: Diagnosis not present

## 2020-12-08 DIAGNOSIS — R918 Other nonspecific abnormal finding of lung field: Secondary | ICD-10-CM | POA: Diagnosis not present

## 2020-12-08 DIAGNOSIS — E1169 Type 2 diabetes mellitus with other specified complication: Secondary | ICD-10-CM | POA: Diagnosis not present

## 2020-12-08 DIAGNOSIS — R0609 Other forms of dyspnea: Secondary | ICD-10-CM | POA: Diagnosis not present

## 2020-12-08 DIAGNOSIS — B9789 Other viral agents as the cause of diseases classified elsewhere: Secondary | ICD-10-CM | POA: Diagnosis not present

## 2020-12-08 DIAGNOSIS — U099 Post covid-19 condition, unspecified: Secondary | ICD-10-CM | POA: Diagnosis not present

## 2020-12-08 DIAGNOSIS — F419 Anxiety disorder, unspecified: Secondary | ICD-10-CM | POA: Diagnosis not present

## 2020-12-08 DIAGNOSIS — J4541 Moderate persistent asthma with (acute) exacerbation: Secondary | ICD-10-CM | POA: Diagnosis not present

## 2020-12-08 DIAGNOSIS — R059 Cough, unspecified: Secondary | ICD-10-CM | POA: Diagnosis not present

## 2020-12-08 DIAGNOSIS — Z7409 Other reduced mobility: Secondary | ICD-10-CM | POA: Diagnosis not present

## 2020-12-08 DIAGNOSIS — G35 Multiple sclerosis: Secondary | ICD-10-CM | POA: Diagnosis not present

## 2020-12-09 ENCOUNTER — Telehealth: Payer: Self-pay | Admitting: Allergy & Immunology

## 2020-12-09 ENCOUNTER — Ambulatory Visit: Payer: Medicare HMO | Admitting: Allergy & Immunology

## 2020-12-09 DIAGNOSIS — R002 Palpitations: Secondary | ICD-10-CM | POA: Diagnosis not present

## 2020-12-09 DIAGNOSIS — R0981 Nasal congestion: Secondary | ICD-10-CM | POA: Diagnosis not present

## 2020-12-09 DIAGNOSIS — R5381 Other malaise: Secondary | ICD-10-CM | POA: Diagnosis not present

## 2020-12-09 DIAGNOSIS — F419 Anxiety disorder, unspecified: Secondary | ICD-10-CM | POA: Diagnosis not present

## 2020-12-09 DIAGNOSIS — R0602 Shortness of breath: Secondary | ICD-10-CM | POA: Diagnosis not present

## 2020-12-09 DIAGNOSIS — R079 Chest pain, unspecified: Secondary | ICD-10-CM | POA: Diagnosis not present

## 2020-12-09 DIAGNOSIS — R110 Nausea: Secondary | ICD-10-CM | POA: Diagnosis not present

## 2020-12-09 NOTE — Telephone Encounter (Signed)
We can change to Symbicort 80/4.42mcg two puffs twice daily.   Salvatore Marvel, MD Allergy and [facility]

## 2020-12-09 NOTE — Telephone Encounter (Signed)
Please advise to change in inhaler as pt did miss appt today

## 2020-12-09 NOTE — Telephone Encounter (Signed)
Pt states Ins will cover Advair 155mcg or symbicort 35mcg and would like one of these sent in

## 2020-12-09 NOTE — Telephone Encounter (Signed)
Patient has an allergy to cortisone and prednisone. It stated that prednisone causes anxiety and palpitations. Please advise on if you would still like Symbicort to be sent in.

## 2020-12-10 ENCOUNTER — Emergency Department (HOSPITAL_BASED_OUTPATIENT_CLINIC_OR_DEPARTMENT_OTHER): Payer: Medicare HMO

## 2020-12-10 ENCOUNTER — Other Ambulatory Visit (HOSPITAL_BASED_OUTPATIENT_CLINIC_OR_DEPARTMENT_OTHER): Payer: Self-pay

## 2020-12-10 ENCOUNTER — Other Ambulatory Visit: Payer: Self-pay

## 2020-12-10 ENCOUNTER — Emergency Department (HOSPITAL_BASED_OUTPATIENT_CLINIC_OR_DEPARTMENT_OTHER)
Admission: EM | Admit: 2020-12-10 | Discharge: 2020-12-10 | Disposition: A | Discharge status: Home / Self Care | Payer: Medicare HMO | Attending: Emergency Medicine | Admitting: Emergency Medicine

## 2020-12-10 DIAGNOSIS — Z9049 Acquired absence of other specified parts of digestive tract: Secondary | ICD-10-CM | POA: Diagnosis not present

## 2020-12-10 DIAGNOSIS — Z79899 Other long term (current) drug therapy: Secondary | ICD-10-CM | POA: Insufficient documentation

## 2020-12-10 DIAGNOSIS — J45909 Unspecified asthma, uncomplicated: Secondary | ICD-10-CM | POA: Insufficient documentation

## 2020-12-10 DIAGNOSIS — Z8541 Personal history of malignant neoplasm of cervix uteri: Secondary | ICD-10-CM | POA: Diagnosis not present

## 2020-12-10 DIAGNOSIS — K219 Gastro-esophageal reflux disease without esophagitis: Secondary | ICD-10-CM | POA: Insufficient documentation

## 2020-12-10 DIAGNOSIS — R140 Abdominal distension (gaseous): Secondary | ICD-10-CM

## 2020-12-10 DIAGNOSIS — I10 Essential (primary) hypertension: Secondary | ICD-10-CM | POA: Insufficient documentation

## 2020-12-10 DIAGNOSIS — Z8616 Personal history of COVID-19: Secondary | ICD-10-CM | POA: Insufficient documentation

## 2020-12-10 DIAGNOSIS — Z87891 Personal history of nicotine dependence: Secondary | ICD-10-CM | POA: Diagnosis not present

## 2020-12-10 DIAGNOSIS — K5900 Constipation, unspecified: Secondary | ICD-10-CM

## 2020-12-10 DIAGNOSIS — R457 State of emotional shock and stress, unspecified: Secondary | ICD-10-CM | POA: Diagnosis not present

## 2020-12-10 DIAGNOSIS — R1084 Generalized abdominal pain: Secondary | ICD-10-CM | POA: Diagnosis not present

## 2020-12-10 DIAGNOSIS — R0602 Shortness of breath: Secondary | ICD-10-CM | POA: Diagnosis not present

## 2020-12-10 DIAGNOSIS — Z7951 Long term (current) use of inhaled steroids: Secondary | ICD-10-CM | POA: Diagnosis not present

## 2020-12-10 DIAGNOSIS — R0689 Other abnormalities of breathing: Secondary | ICD-10-CM | POA: Diagnosis not present

## 2020-12-10 MED ORDER — BUDESONIDE-FORMOTEROL FUMARATE 80-4.5 MCG/ACT IN AERO: 2.0000 | INHALATION_SPRAY | Freq: Two times a day (BID) | RESPIRATORY_TRACT | 1 refills | Status: DC

## 2020-12-10 NOTE — ED Triage Notes (Signed)
ABD pain 9/10 under breast No med taken for pain  Feels like squeezing under breasts

## 2020-12-10 NOTE — Telephone Encounter (Signed)
Yes I think that should be fine.  She has tolerated other combined ICS/LABA in the past.  Salvatore Marvel, MD Allergy and [facility]

## 2020-12-10 NOTE — ED Notes (Signed)
Pt ambulated to the bathroom steady gait

## 2020-12-10 NOTE — Telephone Encounter (Signed)
symbicort 90mcg was sent in and was called, LVM to inform pt.
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
## 2020-12-10 NOTE — Discharge Instructions (Addendum)
Try using miralax or metamucil to help with constipation.  Make sure you are eating plenty of fiber  Follow up with your physicians as scheduled.

## 2020-12-10 NOTE — ED Notes (Signed)
PA @ [facility]

## 2020-12-13 DIAGNOSIS — I491 Atrial premature depolarization: Secondary | ICD-10-CM | POA: Diagnosis not present

## 2020-12-13 DIAGNOSIS — R002 Palpitations: Secondary | ICD-10-CM | POA: Diagnosis not present

## 2020-12-13 DIAGNOSIS — I493 Ventricular premature depolarization: Secondary | ICD-10-CM | POA: Diagnosis not present

## 2020-12-13 NOTE — Telephone Encounter (Signed)
3rd attempt calling to inform her that the Symbicort was sent in to her pharmacy.

## 2020-12-14 DIAGNOSIS — F430 Acute stress reaction: Secondary | ICD-10-CM | POA: Diagnosis not present

## 2020-12-14 DIAGNOSIS — Z87891 Personal history of nicotine dependence: Secondary | ICD-10-CM | POA: Diagnosis not present

## 2020-12-14 DIAGNOSIS — J029 Acute pharyngitis, unspecified: Secondary | ICD-10-CM | POA: Diagnosis not present

## 2020-12-14 DIAGNOSIS — Z8616 Personal history of COVID-19: Secondary | ICD-10-CM | POA: Diagnosis not present

## 2020-12-14 DIAGNOSIS — R140 Abdominal distension (gaseous): Secondary | ICD-10-CM | POA: Diagnosis not present

## 2020-12-14 DIAGNOSIS — Z79899 Other long term (current) drug therapy: Secondary | ICD-10-CM | POA: Diagnosis not present

## 2020-12-14 DIAGNOSIS — F419 Anxiety disorder, unspecified: Secondary | ICD-10-CM | POA: Diagnosis not present

## 2020-12-15 DIAGNOSIS — R1084 Generalized abdominal pain: Secondary | ICD-10-CM | POA: Diagnosis not present

## 2020-12-15 DIAGNOSIS — F439 Reaction to severe stress, unspecified: Secondary | ICD-10-CM | POA: Diagnosis not present

## 2020-12-15 DIAGNOSIS — R110 Nausea: Secondary | ICD-10-CM | POA: Diagnosis not present

## 2020-12-15 DIAGNOSIS — R61 Generalized hyperhidrosis: Secondary | ICD-10-CM | POA: Diagnosis not present

## 2020-12-15 DIAGNOSIS — F430 Acute stress reaction: Secondary | ICD-10-CM | POA: Diagnosis not present

## 2020-12-15 DIAGNOSIS — R140 Abdominal distension (gaseous): Secondary | ICD-10-CM | POA: Diagnosis not present

## 2020-12-15 DIAGNOSIS — F419 Anxiety disorder, unspecified: Secondary | ICD-10-CM | POA: Diagnosis not present

## 2020-12-15 DIAGNOSIS — G8929 Other chronic pain: Secondary | ICD-10-CM | POA: Diagnosis not present

## 2020-12-15 DIAGNOSIS — K0889 Other specified disorders of teeth and supporting structures: Secondary | ICD-10-CM | POA: Diagnosis not present

## 2020-12-17 ENCOUNTER — Other Ambulatory Visit: Payer: Self-pay

## 2020-12-17 ENCOUNTER — Ambulatory Visit (INDEPENDENT_AMBULATORY_CARE_PROVIDER_SITE_OTHER): Payer: Medicare HMO | Admitting: Family Medicine

## 2020-12-17 ENCOUNTER — Encounter: Payer: Self-pay | Admitting: Family Medicine

## 2020-12-17 VITALS — BP 160/90 | HR 91 | Ht 63.0 in | Wt 220.0 lb

## 2020-12-17 DIAGNOSIS — R058 Other specified cough: Secondary | ICD-10-CM

## 2020-12-17 DIAGNOSIS — K5909 Other constipation: Secondary | ICD-10-CM

## 2020-12-17 DIAGNOSIS — G35 Multiple sclerosis: Secondary | ICD-10-CM | POA: Diagnosis not present

## 2020-12-17 NOTE — Assessment & Plan Note (Signed)
MS.  Prior diagnosis with Dr. Sandrea Hammond but has not been back for years.  Initially did not return because of some financial complications.  She would like a new referral placed.  She would also like a prescription for lift chair sent to the [facility].  She has days where she has significant difficulty even getting out of bed or getting up off her couch.

## 2020-12-17 NOTE — Assessment & Plan Note (Signed)
Discussed may be retrying the mag citrate again it sounds like she did have a good result earlier this week but does not feel completely cleaned out so I think it be reasonable for her to do another treatment she could even do a half a bottle instead of a whole bottle this weekend and see if she can feels like she is little bit more soft and cleaned out we did discuss sort of pressing on the belly and making sure that it feels soft.
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
## 2020-12-20 DIAGNOSIS — I10 Essential (primary) hypertension: Secondary | ICD-10-CM | POA: Diagnosis not present

## 2020-12-20 DIAGNOSIS — E611 Iron deficiency: Secondary | ICD-10-CM | POA: Diagnosis not present

## 2020-12-20 DIAGNOSIS — Z8616 Personal history of COVID-19: Secondary | ICD-10-CM | POA: Diagnosis not present

## 2020-12-20 DIAGNOSIS — Z8542 Personal history of malignant neoplasm of other parts of uterus: Secondary | ICD-10-CM | POA: Diagnosis not present

## 2020-12-20 DIAGNOSIS — E785 Hyperlipidemia, unspecified: Secondary | ICD-10-CM | POA: Diagnosis not present

## 2020-12-20 DIAGNOSIS — K5900 Constipation, unspecified: Secondary | ICD-10-CM | POA: Diagnosis not present

## 2020-12-20 DIAGNOSIS — F419 Anxiety disorder, unspecified: Secondary | ICD-10-CM | POA: Diagnosis not present

## 2020-12-20 DIAGNOSIS — K219 Gastro-esophageal reflux disease without esophagitis: Secondary | ICD-10-CM | POA: Diagnosis not present

## 2020-12-20 DIAGNOSIS — G35 Multiple sclerosis: Secondary | ICD-10-CM | POA: Diagnosis not present

## 2020-12-20 DIAGNOSIS — R079 Chest pain, unspecified: Secondary | ICD-10-CM | POA: Diagnosis not present

## 2020-12-20 DIAGNOSIS — Z79899 Other long term (current) drug therapy: Secondary | ICD-10-CM | POA: Diagnosis not present

## 2020-12-20 DIAGNOSIS — R0789 Other chest pain: Secondary | ICD-10-CM | POA: Diagnosis not present

## 2020-12-20 DIAGNOSIS — J984 Other disorders of lung: Secondary | ICD-10-CM | POA: Diagnosis not present

## 2020-12-20 DIAGNOSIS — E1169 Type 2 diabetes mellitus with other specified complication: Secondary | ICD-10-CM | POA: Diagnosis not present

## 2020-12-20 DIAGNOSIS — G4730 Sleep apnea, unspecified: Secondary | ICD-10-CM | POA: Diagnosis not present

## 2020-12-21 ENCOUNTER — Telehealth: Payer: Self-pay | Admitting: *Deleted

## 2020-12-21 NOTE — Telephone Encounter (Signed)
Letter for lift chair faxed

## 2020-12-22 ENCOUNTER — Encounter (HOSPITAL_BASED_OUTPATIENT_CLINIC_OR_DEPARTMENT_OTHER): Payer: Self-pay

## 2020-12-22 ENCOUNTER — Emergency Department (HOSPITAL_BASED_OUTPATIENT_CLINIC_OR_DEPARTMENT_OTHER): Payer: Medicare HMO

## 2020-12-22 ENCOUNTER — Emergency Department (HOSPITAL_BASED_OUTPATIENT_CLINIC_OR_DEPARTMENT_OTHER)
Admission: EM | Admit: 2020-12-22 | Discharge: 2020-12-22 | Disposition: A | Discharge status: Home / Self Care | Payer: Medicare HMO | Attending: Emergency Medicine | Admitting: Emergency Medicine

## 2020-12-22 ENCOUNTER — Other Ambulatory Visit: Payer: Self-pay

## 2020-12-22 DIAGNOSIS — R6884 Jaw pain: Secondary | ICD-10-CM | POA: Diagnosis not present

## 2020-12-22 DIAGNOSIS — I10 Essential (primary) hypertension: Secondary | ICD-10-CM | POA: Insufficient documentation

## 2020-12-22 DIAGNOSIS — R1013 Epigastric pain: Secondary | ICD-10-CM | POA: Diagnosis not present

## 2020-12-22 DIAGNOSIS — Z8616 Personal history of COVID-19: Secondary | ICD-10-CM | POA: Insufficient documentation

## 2020-12-22 DIAGNOSIS — R110 Nausea: Secondary | ICD-10-CM | POA: Diagnosis not present

## 2020-12-22 DIAGNOSIS — R079 Chest pain, unspecified: Secondary | ICD-10-CM | POA: Diagnosis not present

## 2020-12-22 DIAGNOSIS — J45909 Unspecified asthma, uncomplicated: Secondary | ICD-10-CM | POA: Insufficient documentation

## 2020-12-22 DIAGNOSIS — Z87891 Personal history of nicotine dependence: Secondary | ICD-10-CM | POA: Diagnosis not present

## 2020-12-22 LAB — CBC
HCT: 44.8 % (ref 36.0–46.0)
Hemoglobin: 14.9 g/dL (ref 12.0–15.0)
MCH: 29.5 pg (ref 26.0–34.0)
MCHC: 33.3 g/dL (ref 30.0–36.0)
MCV: 88.7 fL (ref 80.0–100.0)
Platelets: 179 10*3/uL (ref 150–400)
RBC: 5.05 MIL/uL (ref 3.87–5.11)
RDW: 14.6 % (ref 11.5–15.5)
WBC: 6.2 10*3/uL (ref 4.0–10.5)
nRBC: 0 % (ref 0.0–0.2)

## 2020-12-22 LAB — URINALYSIS, ROUTINE W REFLEX MICROSCOPIC
Bilirubin Urine: NEGATIVE
Glucose, UA: NEGATIVE mg/dL
Hgb urine dipstick: NEGATIVE
Ketones, ur: NEGATIVE mg/dL
Leukocytes,Ua: NEGATIVE
Nitrite: NEGATIVE
Protein, ur: NEGATIVE mg/dL
Specific Gravity, Urine: 1.025 (ref 1.005–1.030)
pH: 5.5 (ref 5.0–8.0)

## 2020-12-22 LAB — COMPREHENSIVE METABOLIC PANEL
ALT: 22 U/L (ref 0–44)
AST: 16 U/L (ref 15–41)
Albumin: 3.9 g/dL (ref 3.5–5.0)
Alkaline Phosphatase: 68 U/L (ref 38–126)
Anion gap: 9 (ref 5–15)
BUN: 12 mg/dL (ref 6–20)
CO2: 23 mmol/L (ref 22–32)
Calcium: 8.7 mg/dL — ABNORMAL LOW (ref 8.9–10.3)
Chloride: 108 mmol/L (ref 98–111)
Creatinine, Ser: 0.6 mg/dL (ref 0.44–1.00)
GFR, Estimated: >60 mL/min (ref >60)
Glucose, Bld: 127 mg/dL — ABNORMAL HIGH (ref 70–99)
Potassium: 3.5 mmol/L (ref 3.5–5.1)
Sodium: 140 mmol/L (ref 135–145)
Total Bilirubin: 0.3 mg/dL (ref 0.3–1.2)
Total Protein: 7.2 g/dL (ref 6.5–8.1)

## 2020-12-22 LAB — TROPONIN I (HIGH SENSITIVITY): Troponin I (High Sensitivity): 2 ng/L (ref <18)

## 2020-12-22 LAB — LIPASE, BLOOD: Lipase: 38 U/L (ref 11–51)

## 2020-12-22 MED ORDER — ALUM & MAG HYDROXIDE-SIMETH 200-200-20 MG/5ML PO SUSP
15.0000 mL | Freq: Once | ORAL | Status: AC
  Administered 2020-12-22: 15 mL via ORAL
  Filled 2020-12-22: qty 30

## 2020-12-22 MED ORDER — DICYCLOMINE HCL 20 MG PO TABS: 20.0000 mg | ORAL_TABLET | Freq: Three times a day (TID) | ORAL | 0 refills | Status: DC | PRN

## 2020-12-22 NOTE — Discharge Instructions (Signed)

## 2020-12-22 NOTE — ED Triage Notes (Addendum)
Pt c/o epigastric pain, pain to left UE and jaw x 1 week-states she was seen recently for both c/o-also states she is waiting for heart monitor-NAD-steady gait
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
## 2020-12-23 DIAGNOSIS — R42 Dizziness and giddiness: Secondary | ICD-10-CM | POA: Diagnosis not present

## 2020-12-23 DIAGNOSIS — R059 Cough, unspecified: Secondary | ICD-10-CM | POA: Diagnosis not present

## 2020-12-23 DIAGNOSIS — R9431 Abnormal electrocardiogram [ECG] [EKG]: Secondary | ICD-10-CM | POA: Diagnosis not present

## 2020-12-23 DIAGNOSIS — R6884 Jaw pain: Secondary | ICD-10-CM | POA: Diagnosis not present

## 2020-12-23 DIAGNOSIS — I10 Essential (primary) hypertension: Secondary | ICD-10-CM | POA: Diagnosis not present

## 2020-12-24 DIAGNOSIS — R079 Chest pain, unspecified: Secondary | ICD-10-CM | POA: Diagnosis not present

## 2020-12-24 DIAGNOSIS — R0789 Other chest pain: Secondary | ICD-10-CM | POA: Diagnosis not present

## 2020-12-24 DIAGNOSIS — R0602 Shortness of breath: Secondary | ICD-10-CM | POA: Diagnosis not present

## 2020-12-24 DIAGNOSIS — R072 Precordial pain: Secondary | ICD-10-CM | POA: Diagnosis not present

## 2020-12-24 DIAGNOSIS — F419 Anxiety disorder, unspecified: Secondary | ICD-10-CM | POA: Diagnosis not present

## 2020-12-24 DIAGNOSIS — R42 Dizziness and giddiness: Secondary | ICD-10-CM | POA: Diagnosis not present

## 2020-12-24 DIAGNOSIS — R002 Palpitations: Secondary | ICD-10-CM | POA: Diagnosis not present

## 2020-12-24 DIAGNOSIS — R457 State of emotional shock and stress, unspecified: Secondary | ICD-10-CM | POA: Diagnosis not present

## 2020-12-24 DIAGNOSIS — I498 Other specified cardiac arrhythmias: Secondary | ICD-10-CM | POA: Diagnosis not present

## 2020-12-24 DIAGNOSIS — S2231XA Fracture of one rib, right side, initial encounter for closed fracture: Secondary | ICD-10-CM | POA: Diagnosis not present

## 2020-12-24 DIAGNOSIS — I10 Essential (primary) hypertension: Secondary | ICD-10-CM | POA: Diagnosis not present

## 2020-12-25 DIAGNOSIS — J4520 Mild intermittent asthma, uncomplicated: Secondary | ICD-10-CM | POA: Diagnosis not present

## 2020-12-27 ENCOUNTER — Ambulatory Visit: Payer: Medicare HMO | Admitting: Family Medicine

## 2020-12-27 DIAGNOSIS — H539 Unspecified visual disturbance: Secondary | ICD-10-CM | POA: Diagnosis not present

## 2020-12-27 DIAGNOSIS — I10 Essential (primary) hypertension: Secondary | ICD-10-CM | POA: Diagnosis not present

## 2020-12-27 DIAGNOSIS — R519 Headache, unspecified: Secondary | ICD-10-CM | POA: Diagnosis not present

## 2020-12-27 DIAGNOSIS — Z87891 Personal history of nicotine dependence: Secondary | ICD-10-CM | POA: Diagnosis not present

## 2020-12-27 DIAGNOSIS — R1010 Upper abdominal pain, unspecified: Secondary | ICD-10-CM | POA: Diagnosis not present

## 2020-12-27 DIAGNOSIS — R229 Localized swelling, mass and lump, unspecified: Secondary | ICD-10-CM | POA: Diagnosis not present

## 2020-12-27 DIAGNOSIS — I498 Other specified cardiac arrhythmias: Secondary | ICD-10-CM | POA: Diagnosis not present

## 2020-12-27 DIAGNOSIS — R079 Chest pain, unspecified: Secondary | ICD-10-CM | POA: Diagnosis not present

## 2020-12-27 NOTE — Progress Notes (Deleted)
[place] [id number] Dept: [phone]  FOLLOW UP NOTE  Patient ID: Jennifer [name], female    DOB: July 16, 1966  Age: 55 y.o. MRN: [id number] Date of Office Visit: 12/27/2020  Assessment  Chief Complaint: No chief complaint on file.  HPI Jennifer [name]    Drug Allergies:  Allergies  Allergen Reactions  . Azithromycin Shortness Of Breath    Lip swelling, SOB.     . Ciprofloxacin Swelling    REACTION: tongue swells  . Codeine Shortness Of Breath  . Erythromycin Base Itching and Rash  . Sulfa Antibiotics Shortness Of Breath, Rash and Other (See Comments)  . Sulfasalazine Rash and Shortness Of Breath    Other reaction(s): Other (See Comments) Other reaction(s): SHORTNESS OF BREATH  . Telmisartan Swelling    Tongue swelling, Micardis  . Ace Inhibitors Cough  . Aspirin Hives and Other (See Comments)    flushing  . Atenolol Other (See Comments)    Squeezing chest sensation  . Avelox [Moxifloxacin Hcl In Nacl] Itching       . Beta Adrenergic Blockers Other (See Comments)    Feels like chest tightening labetalol, bystolic  Feels like chest tightening "Metoprolol"   . Buspar [Buspirone] Other (See Comments)    Light headed  . Butorphanol Tartrate Other (See Comments)    Patient aggitated  . Cetirizine Hives and Rash       . Clonidine Hcl     REACTION: makes blood pressure high  . Cortisone     Feels like she is going crazy  . Erythromycin Rash  . Fentanyl Other (See Comments)    aggressive   . Fluoxetine Hcl Other (See Comments)    REACTION: headaches  . Ketorolac Tromethamine     jittery  . Lidocaine Other (See Comments)    When it involves the throat,   . Lisinopril Cough  . Metoclopramide Hcl Other (See Comments)    Dystonic reaction  . Midazolam Other (See Comments)    agitation Slow to wake up  . Montelukast Other (See Comments)    Singulair  . Montelukast Sodium Other (See Comments)    DOES NOT REMEMBER  Don't remember-told  not to take  . Naproxen Other (See Comments)    FLUSHING Pt states she took Ibuprofen today (10/08/19)  . Paroxetine Other (See Comments)    REACTION: headaches  . Penicillins Rash  . Pravastatin Other (See Comments)    Myalgias  . Promethazine Other (See Comments)    Dystonic reaction  . Promethazine Hcl Other (See Comments)    jittery  . Quinolones Swelling and Rash  . Serotonin Reuptake Inhibitors (Ssris) Other (See Comments)    Headache Effexor, prozac, zoloft,   . Sertraline Hcl     REACTION: headaches  . Stelazine [Trifluoperazine] Other (See Comments)    Dystonic reaction  . Tobramycin Itching and Rash  . Trifluoperazine Hcl     dystonic  . Atrovent Nasal Spray [Ipratropium]     Tachycardia and shaking  . Diltiazem Other (See Comments)    Chest pain  . Polyethylene Glycol 3350     Other reaction(s): Laryngeal Edema (ALLERGY)  . Propoxyphene   . Adhesive [Tape] Rash    EKG monitor patches, some tapes Blisters, rash, itching, welts.  . Butorphanol Anxiety    Patient agitated  . Ceftriaxone Rash    rocephin  . Iron Rash    Flushing with certain IV types  . Metoclopramide Itching and Other (See  Comments)    Dystonic reaction  . Metronidazole Rash  . Other Rash and Other (See Comments)    Uncoded Allergy. Allergen: steriods, Other Reaction: Not Assessed Other reaction(s): Flushing (ALLERGY/intolerance), GI Upset (intolerance), Hypertension (intolerance), Increased Heart Rate (intolerance), Mental Status Changes (intolerance), Other (See Comments), Tachycardia / Palpitations(intolerance) Hospital gowns leave a rash.   . Prednisone Anxiety and Palpitations  . Prochlorperazine Anxiety    Compazine:  Dystonic reaction  . Venlafaxine Anxiety  . Zyrtec [Cetirizine Hcl] Rash    All over body    Physical Exam: LMP 06/25/2013    Physical Exam  Diagnostics:    Assessment and Plan: No diagnosis found.  No orders of the defined types were placed in this  encounter.   There are no Patient Instructions on file for this visit.  No follow-ups on file.    Thank you for the opportunity to care for this patient.  Please do not hesitate to contact me with questions.  Gareth Morgan, FNP [facility]

## 2020-12-27 NOTE — Patient Instructions (Incomplete)
Asthma Continue Xopenex 2 puffs every 6 hours as needed for cough, wheeze,tightness in chest, or shortness of breath OR Xopenex 1.25 mg 1 unit dose via nebulizer every 6 hours as needed Start Pulmicort 0.25 mg twice daily via the nebulizer to help prevent cough and wheeze. Do not use Performist due to cost Continue to follow up with [facility] Pulmonology.  Continue to use aerobika valve 1-2 times a day for 5-10 minutes as recommended by  [facility] Pulmonology Please call your insurance company to find out which inhaled corticosteroids//breathing medicines are in the correct tear that you can afford and let us know.  Chronic rhinitis Continue with nasal saline rinses. Continue mometasone nasal spray 1-2 sprays each nostril once a day as needed for stuffy nose  Reflux Continue dietary and lifestyle modifications as listed below Continue to follow up with GI  Your blood pressure was elevated while in the office today. Schedule an appointment with your primary care physician or cardiology to discuss  Call the clinic if this treatment plan is not working well for you  Follow up in *** or sooner if needed.

## 2020-12-30 DIAGNOSIS — R1310 Dysphagia, unspecified: Secondary | ICD-10-CM | POA: Diagnosis not present

## 2020-12-30 DIAGNOSIS — R110 Nausea: Secondary | ICD-10-CM | POA: Diagnosis not present

## 2020-12-30 DIAGNOSIS — R070 Pain in throat: Secondary | ICD-10-CM | POA: Diagnosis not present

## 2020-12-30 DIAGNOSIS — R6884 Jaw pain: Secondary | ICD-10-CM | POA: Diagnosis not present

## 2020-12-30 DIAGNOSIS — R1013 Epigastric pain: Secondary | ICD-10-CM | POA: Diagnosis not present

## 2020-12-30 DIAGNOSIS — G8929 Other chronic pain: Secondary | ICD-10-CM | POA: Diagnosis not present

## 2020-12-31 ENCOUNTER — Emergency Department (HOSPITAL_BASED_OUTPATIENT_CLINIC_OR_DEPARTMENT_OTHER)
Admission: EM | Admit: 2020-12-31 | Discharge: 2020-12-31 | Disposition: A | Discharge status: Home / Self Care | Payer: Medicare HMO | Attending: Emergency Medicine | Admitting: Emergency Medicine

## 2020-12-31 ENCOUNTER — Emergency Department (HOSPITAL_BASED_OUTPATIENT_CLINIC_OR_DEPARTMENT_OTHER): Payer: Medicare HMO

## 2020-12-31 ENCOUNTER — Encounter: Payer: Self-pay | Admitting: Family Medicine

## 2020-12-31 ENCOUNTER — Other Ambulatory Visit: Payer: Self-pay

## 2020-12-31 ENCOUNTER — Encounter (HOSPITAL_BASED_OUTPATIENT_CLINIC_OR_DEPARTMENT_OTHER): Payer: Self-pay | Admitting: Urology

## 2020-12-31 ENCOUNTER — Ambulatory Visit (INDEPENDENT_AMBULATORY_CARE_PROVIDER_SITE_OTHER): Payer: Medicare HMO | Admitting: Family Medicine

## 2020-12-31 VITALS — BP 138/76 | HR 99 | Ht 63.0 in | Wt 218.0 lb

## 2020-12-31 DIAGNOSIS — G35 Multiple sclerosis: Secondary | ICD-10-CM | POA: Diagnosis not present

## 2020-12-31 DIAGNOSIS — R300 Dysuria: Secondary | ICD-10-CM

## 2020-12-31 DIAGNOSIS — K219 Gastro-esophageal reflux disease without esophagitis: Secondary | ICD-10-CM | POA: Diagnosis not present

## 2020-12-31 DIAGNOSIS — Z8616 Personal history of COVID-19: Secondary | ICD-10-CM | POA: Diagnosis not present

## 2020-12-31 DIAGNOSIS — R6881 Early satiety: Secondary | ICD-10-CM

## 2020-12-31 DIAGNOSIS — R1013 Epigastric pain: Secondary | ICD-10-CM

## 2020-12-31 DIAGNOSIS — R1084 Generalized abdominal pain: Secondary | ICD-10-CM

## 2020-12-31 DIAGNOSIS — Z87891 Personal history of nicotine dependence: Secondary | ICD-10-CM | POA: Diagnosis not present

## 2020-12-31 DIAGNOSIS — R109 Unspecified abdominal pain: Secondary | ICD-10-CM | POA: Diagnosis present

## 2020-12-31 DIAGNOSIS — Z8542 Personal history of malignant neoplasm of other parts of uterus: Secondary | ICD-10-CM | POA: Insufficient documentation

## 2020-12-31 DIAGNOSIS — Z7951 Long term (current) use of inhaled steroids: Secondary | ICD-10-CM | POA: Insufficient documentation

## 2020-12-31 DIAGNOSIS — R1310 Dysphagia, unspecified: Secondary | ICD-10-CM

## 2020-12-31 DIAGNOSIS — J45901 Unspecified asthma with (acute) exacerbation: Secondary | ICD-10-CM | POA: Insufficient documentation

## 2020-12-31 DIAGNOSIS — K5900 Constipation, unspecified: Secondary | ICD-10-CM | POA: Diagnosis not present

## 2020-12-31 DIAGNOSIS — I10 Essential (primary) hypertension: Secondary | ICD-10-CM | POA: Insufficient documentation

## 2020-12-31 DIAGNOSIS — Z79899 Other long term (current) drug therapy: Secondary | ICD-10-CM | POA: Insufficient documentation

## 2020-12-31 LAB — CBC WITH DIFFERENTIAL/PLATELET
Abs Immature Granulocytes: 0.03 10*3/uL (ref 0.00–0.07)
Basophils Absolute: 0.1 10*3/uL (ref 0.0–0.1)
Basophils Relative: 1 %
Eosinophils Absolute: 0.2 10*3/uL (ref 0.0–0.5)
Eosinophils Relative: 2 %
HCT: 44.5 % (ref 36.0–46.0)
Hemoglobin: 14.6 g/dL (ref 12.0–15.0)
Immature Granulocytes: 0 %
Lymphocytes Relative: 21 %
Lymphs Abs: 1.6 10*3/uL (ref 0.7–4.0)
MCH: 29.1 pg (ref 26.0–34.0)
MCHC: 32.8 g/dL (ref 30.0–36.0)
MCV: 88.6 fL (ref 80.0–100.0)
Monocytes Absolute: 0.6 10*3/uL (ref 0.1–1.0)
Monocytes Relative: 9 %
Neutro Abs: 5 10*3/uL (ref 1.7–7.7)
Neutrophils Relative %: 67 %
Platelets: 186 10*3/uL (ref 150–400)
RBC: 5.02 MIL/uL (ref 3.87–5.11)
RDW: 14.4 % (ref 11.5–15.5)
WBC: 7.4 10*3/uL (ref 4.0–10.5)
nRBC: 0 % (ref 0.0–0.2)

## 2020-12-31 LAB — POCT URINALYSIS DIP (CLINITEK)
Bilirubin, UA: NEGATIVE
Blood, UA: NEGATIVE
Glucose, UA: NEGATIVE mg/dL
Leukocytes, UA: NEGATIVE
Nitrite, UA: NEGATIVE
POC PROTEIN,UA: NEGATIVE
Spec Grav, UA: 1.025 (ref 1.010–1.025)
Urobilinogen, UA: 0.2 E.U./dL
pH, UA: 6 (ref 5.0–8.0)

## 2020-12-31 LAB — URINALYSIS, ROUTINE W REFLEX MICROSCOPIC
Bilirubin Urine: NEGATIVE
Glucose, UA: NEGATIVE mg/dL
Hgb urine dipstick: NEGATIVE
Ketones, ur: NEGATIVE mg/dL
Leukocytes,Ua: NEGATIVE
Nitrite: NEGATIVE
Protein, ur: NEGATIVE mg/dL
Specific Gravity, Urine: 1.025 (ref 1.005–1.030)
pH: 6 (ref 5.0–8.0)

## 2020-12-31 LAB — COMPREHENSIVE METABOLIC PANEL
ALT: 21 U/L (ref 0–44)
AST: 15 U/L (ref 15–41)
Albumin: 3.8 g/dL (ref 3.5–5.0)
Alkaline Phosphatase: 71 U/L (ref 38–126)
Anion gap: 9 (ref 5–15)
BUN: 19 mg/dL (ref 6–20)
CO2: 24 mmol/L (ref 22–32)
Calcium: 9.2 mg/dL (ref 8.9–10.3)
Chloride: 108 mmol/L (ref 98–111)
Creatinine, Ser: 0.71 mg/dL (ref 0.44–1.00)
GFR, Estimated: >60 mL/min (ref >60)
Glucose, Bld: 136 mg/dL — ABNORMAL HIGH (ref 70–99)
Potassium: 4.1 mmol/L (ref 3.5–5.1)
Sodium: 141 mmol/L (ref 135–145)
Total Bilirubin: 0.2 mg/dL — ABNORMAL LOW (ref 0.3–1.2)
Total Protein: 7 g/dL (ref 6.5–8.1)

## 2020-12-31 LAB — LIPASE, BLOOD: Lipase: 46 U/L (ref 11–51)

## 2020-12-31 MED ORDER — OMEPRAZOLE 40 MG PO CPDR: 40.0000 mg | DELAYED_RELEASE_CAPSULE | Freq: Every day | ORAL | 1 refills | Status: DC

## 2020-12-31 MED ORDER — ONDANSETRON 4 MG PO TBDP
4.0000 mg | ORAL_TABLET | Freq: Once | ORAL | Status: DC
  Filled 2020-12-31: qty 1

## 2020-12-31 MED ORDER — AMBULATORY NON FORMULARY MEDICATION: 0 refills | Status: DC

## 2020-12-31 MED ORDER — SENNOSIDES-DOCUSATE SODIUM 8.6-50 MG PO TABS
2.0000 | ORAL_TABLET | Freq: Once | ORAL | Status: DC
  Filled 2020-12-31: qty 2

## 2020-12-31 NOTE — ED Notes (Signed)
Patient transported to X-ray 
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
## 2020-12-31 NOTE — ED Triage Notes (Signed)
Trouble with constipation, took liquid glycerin suppository, Abdominal pain since taking it, has h/o rectocele, lower abdominal bruising with no known injury. Saw PCP earlier today with no help.

## 2020-12-31 NOTE — Discharge Instructions (Addendum)
Eat a diet rich in fiber and drink plenty of water. Exercise 15-20 minutes per day if possible.  Bowel routine steps:  Step 1: Take 2 Senokot pills each day at bedtime. If no bowel movement after 2 days progress to step 2. Step 2: Take 2 Senokot pills in the morning and 2 in the evening. If no bowel movement in one day progress to step 3. Step 3: Take 3 Senokot pills and 1 capful of MiraLAX in the morning and in the evening. If no bowel movement after one day, start step 4. Step 4: Take 4 Senokot pills and one capful of MiraLAX in the morning, another capful of MiraLAX at lunch, and 4 Senokot pills and a capful of MiraLAX in the evening  *Talk to your doctor if you are still constipated after following these 4 steps. Don't take more than 8 Senokot pills a day. If loose stools develop, go back to the previous step. Do not stop the bowel routine completely.  Please use the above regimen to help with constipation. All of your labs were reassuring today. Your x-ray did not show an obstruction. It was a pleasure taking care of your today. I hope you feel better soon. Please follow-up with PCP if symptoms do not improve within the next week. Return to the ER for new or worsening symptoms.
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
## 2020-12-31 NOTE — ED Notes (Signed)
Pt very anxious, pacing the floor, states unable to have a BM, unknown last BM.  Pt reports h/o rectocele, pain with straining and unable to have BM after taking laxative today.

## 2020-12-31 NOTE — ED Notes (Signed)
Pt states unable to urinate at this time, continues to pace the room

## 2020-12-31 NOTE — Progress Notes (Signed)
She states that she feels nauseated,her abdomen feels more swollen on the upper R side, she has had several small BM's this morning.   She hasn't been drinking much over the past 1.5.

## 2020-12-31 NOTE — Assessment & Plan Note (Addendum)
Did encourage her to get back in with Dr. Cathey Endow office.  We will go ahead and place new referral today we did place 1 last year but she says she was never contacted.  We do need just continue to do the work-up for MS.  In the meantime I did write a prescription for a wheelchair to use at times when she is feeling weak I do not think she needs a scooter at this point.  She does have a mobility limitation that impairs her ability to do certain ADLs such as cooking and cleaning.  At times her symptoms are severe enough that she is unable to use a cane, crutches or walker to resolve the issue sufficiently.  She is able to safely self propel the wheelchair within the home.

## 2021-01-01 DIAGNOSIS — K5900 Constipation, unspecified: Secondary | ICD-10-CM | POA: Diagnosis not present

## 2021-01-01 DIAGNOSIS — Z765 Malingerer [conscious simulation]: Secondary | ICD-10-CM | POA: Diagnosis not present

## 2021-01-01 DIAGNOSIS — R000 Tachycardia, unspecified: Secondary | ICD-10-CM | POA: Diagnosis not present

## 2021-01-01 DIAGNOSIS — I10 Essential (primary) hypertension: Secondary | ICD-10-CM | POA: Diagnosis not present

## 2021-01-01 DIAGNOSIS — R109 Unspecified abdominal pain: Secondary | ICD-10-CM | POA: Diagnosis not present

## 2021-01-02 LAB — URINE CULTURE
MICRO NUMBER:: 11918573
SPECIMEN QUALITY:: ADEQUATE

## 2021-01-03 ENCOUNTER — Other Ambulatory Visit: Payer: Self-pay

## 2021-01-03 ENCOUNTER — Emergency Department (HOSPITAL_BASED_OUTPATIENT_CLINIC_OR_DEPARTMENT_OTHER): Payer: Medicare HMO

## 2021-01-03 ENCOUNTER — Emergency Department (HOSPITAL_BASED_OUTPATIENT_CLINIC_OR_DEPARTMENT_OTHER)
Admission: EM | Admit: 2021-01-03 | Discharge: 2021-01-03 | Disposition: A | Discharge status: Home / Self Care | Payer: Medicare HMO | Attending: Emergency Medicine | Admitting: Emergency Medicine

## 2021-01-03 ENCOUNTER — Encounter (HOSPITAL_BASED_OUTPATIENT_CLINIC_OR_DEPARTMENT_OTHER): Payer: Self-pay | Admitting: *Deleted

## 2021-01-03 DIAGNOSIS — Z8542 Personal history of malignant neoplasm of other parts of uterus: Secondary | ICD-10-CM | POA: Insufficient documentation

## 2021-01-03 DIAGNOSIS — K219 Gastro-esophageal reflux disease without esophagitis: Secondary | ICD-10-CM | POA: Diagnosis not present

## 2021-01-03 DIAGNOSIS — Z8616 Personal history of COVID-19: Secondary | ICD-10-CM | POA: Insufficient documentation

## 2021-01-03 DIAGNOSIS — Z87891 Personal history of nicotine dependence: Secondary | ICD-10-CM | POA: Insufficient documentation

## 2021-01-03 DIAGNOSIS — Z79899 Other long term (current) drug therapy: Secondary | ICD-10-CM | POA: Diagnosis not present

## 2021-01-03 DIAGNOSIS — K5900 Constipation, unspecified: Secondary | ICD-10-CM | POA: Insufficient documentation

## 2021-01-03 DIAGNOSIS — R21 Rash and other nonspecific skin eruption: Secondary | ICD-10-CM | POA: Diagnosis not present

## 2021-01-03 DIAGNOSIS — J45909 Unspecified asthma, uncomplicated: Secondary | ICD-10-CM | POA: Insufficient documentation

## 2021-01-03 DIAGNOSIS — R079 Chest pain, unspecified: Secondary | ICD-10-CM | POA: Diagnosis not present

## 2021-01-03 DIAGNOSIS — R1013 Epigastric pain: Secondary | ICD-10-CM | POA: Diagnosis not present

## 2021-01-03 DIAGNOSIS — R109 Unspecified abdominal pain: Secondary | ICD-10-CM | POA: Diagnosis present

## 2021-01-03 LAB — CBC
HCT: 43.5 % (ref 36.0–46.0)
Hemoglobin: 14.3 g/dL (ref 12.0–15.0)
MCH: 29.1 pg (ref 26.0–34.0)
MCHC: 32.9 g/dL (ref 30.0–36.0)
MCV: 88.6 fL (ref 80.0–100.0)
Platelets: 188 10*3/uL (ref 150–400)
RBC: 4.91 MIL/uL (ref 3.87–5.11)
RDW: 14.3 % (ref 11.5–15.5)
WBC: 7 10*3/uL (ref 4.0–10.5)
nRBC: 0 % (ref 0.0–0.2)

## 2021-01-03 LAB — COMPREHENSIVE METABOLIC PANEL
ALT: 23 U/L (ref 0–44)
AST: 21 U/L (ref 15–41)
Albumin: 3.6 g/dL (ref 3.5–5.0)
Alkaline Phosphatase: 70 U/L (ref 38–126)
Anion gap: 6 (ref 5–15)
BUN: 13 mg/dL (ref 6–20)
CO2: 24 mmol/L (ref 22–32)
Calcium: 8.5 mg/dL — ABNORMAL LOW (ref 8.9–10.3)
Chloride: 108 mmol/L (ref 98–111)
Creatinine, Ser: 0.6 mg/dL (ref 0.44–1.00)
GFR, Estimated: >60 mL/min (ref >60)
Glucose, Bld: 118 mg/dL — ABNORMAL HIGH (ref 70–99)
Potassium: 4.1 mmol/L (ref 3.5–5.1)
Sodium: 138 mmol/L (ref 135–145)
Total Bilirubin: 0.4 mg/dL (ref 0.3–1.2)
Total Protein: 6.7 g/dL (ref 6.5–8.1)

## 2021-01-03 LAB — LIPASE, BLOOD: Lipase: 39 U/L (ref 11–51)

## 2021-01-03 MED ORDER — DICYCLOMINE HCL 10 MG PO CAPS
20.0000 mg | ORAL_CAPSULE | Freq: Once | ORAL | Status: AC
  Administered 2021-01-03: 20 mg via ORAL
  Filled 2021-01-03: qty 2

## 2021-01-03 MED ORDER — ONDANSETRON 4 MG PO TBDP
2.0000 mg | ORAL_TABLET | Freq: Once | ORAL | Status: AC
  Administered 2021-01-03: 2 mg via ORAL
  Filled 2021-01-03: qty 1

## 2021-01-03 MED ORDER — FAMOTIDINE 20 MG PO TABS
40.0000 mg | ORAL_TABLET | Freq: Once | ORAL | Status: AC
  Administered 2021-01-03: 40 mg via ORAL
  Filled 2021-01-03: qty 2
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
## 2021-01-03 NOTE — ED Notes (Signed)
Pt ambulatory to [facility]

## 2021-01-03 NOTE — ED Notes (Signed)
Pt drinking water with her meds

## 2021-01-03 NOTE — ED Triage Notes (Addendum)
C/o severe abd pain and constipation x 1 week , c/o vomiting brown emesis

## 2021-01-03 NOTE — Discharge Instructions (Addendum)
Please follow-up with your primary care doctor.  Please take your medications as prescribed.  Please drink plenty of water.

## 2021-01-05 DIAGNOSIS — K5790 Diverticulosis of intestine, part unspecified, without perforation or abscess without bleeding: Secondary | ICD-10-CM | POA: Diagnosis not present

## 2021-01-05 DIAGNOSIS — N281 Cyst of kidney, acquired: Secondary | ICD-10-CM | POA: Diagnosis not present

## 2021-01-05 DIAGNOSIS — I498 Other specified cardiac arrhythmias: Secondary | ICD-10-CM | POA: Diagnosis not present

## 2021-01-05 DIAGNOSIS — K5730 Diverticulosis of large intestine without perforation or abscess without bleeding: Secondary | ICD-10-CM | POA: Diagnosis not present

## 2021-01-05 DIAGNOSIS — K220 Achalasia of cardia: Secondary | ICD-10-CM | POA: Diagnosis not present

## 2021-01-05 DIAGNOSIS — D1809 Hemangioma of other sites: Secondary | ICD-10-CM | POA: Diagnosis not present

## 2021-01-05 DIAGNOSIS — K429 Umbilical hernia without obstruction or gangrene: Secondary | ICD-10-CM | POA: Diagnosis not present

## 2021-01-05 DIAGNOSIS — M542 Cervicalgia: Secondary | ICD-10-CM | POA: Diagnosis not present

## 2021-01-06 DIAGNOSIS — R079 Chest pain, unspecified: Secondary | ICD-10-CM | POA: Diagnosis not present

## 2021-01-06 DIAGNOSIS — Z79899 Other long term (current) drug therapy: Secondary | ICD-10-CM | POA: Diagnosis not present

## 2021-01-06 DIAGNOSIS — R922 Inconclusive mammogram: Secondary | ICD-10-CM | POA: Diagnosis not present

## 2021-01-06 DIAGNOSIS — N644 Mastodynia: Secondary | ICD-10-CM | POA: Diagnosis not present

## 2021-01-06 LAB — HM MAMMOGRAPHY

## 2021-01-08 IMAGING — US US EXTREM LOW VENOUS*L*
1 series · 13 of 24 positions shown · non-contrast
Comparison: None.

CLINICAL DATA: Left lower extremity pain and edema for the past 2
weeks. Evaluate for DVT.



[Series 1: us extrem low venous*left* · 13 of 26 slices shown]
[im 1/26]
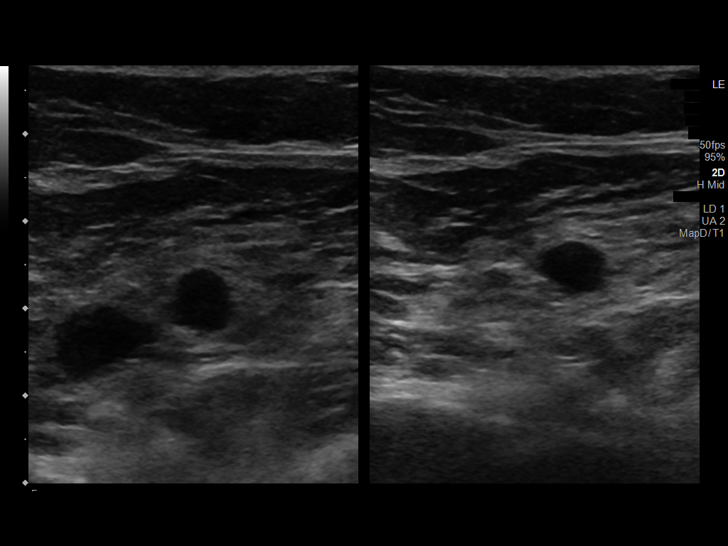
[im 3/26]
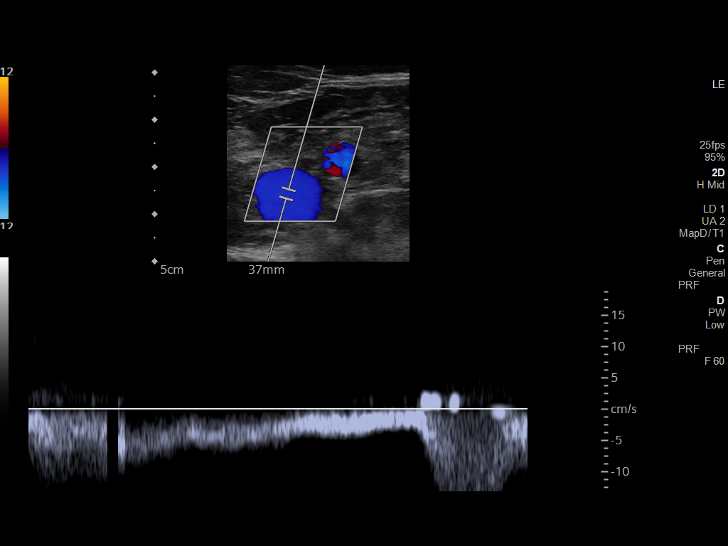
[im 5/26]
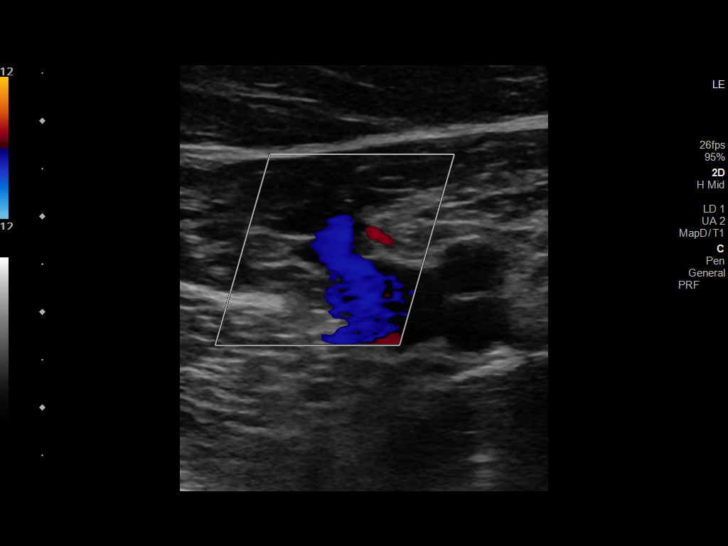
[im 7/26]
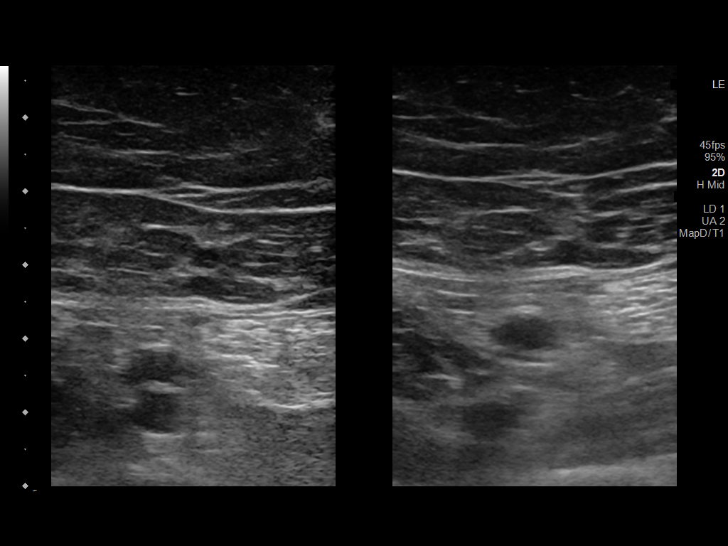
[im 9/26]
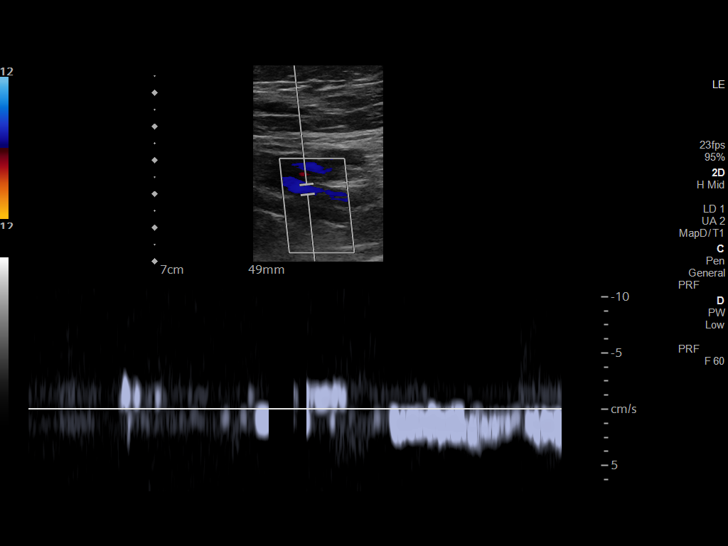
[im 11/26]
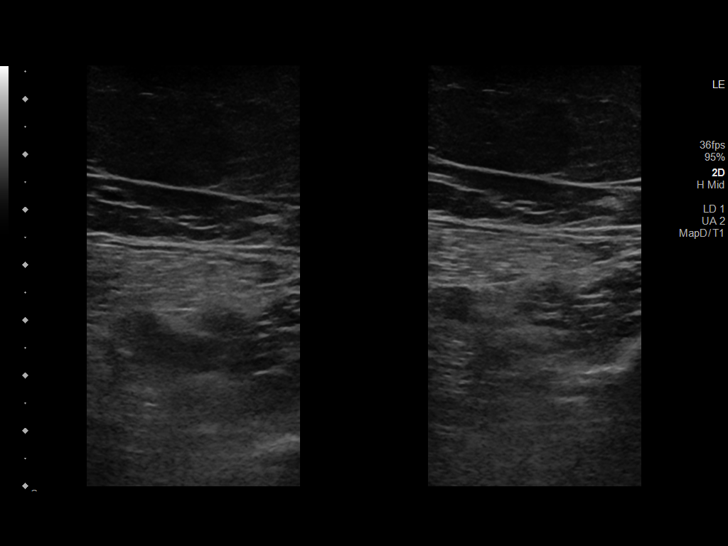
[im 14/26]
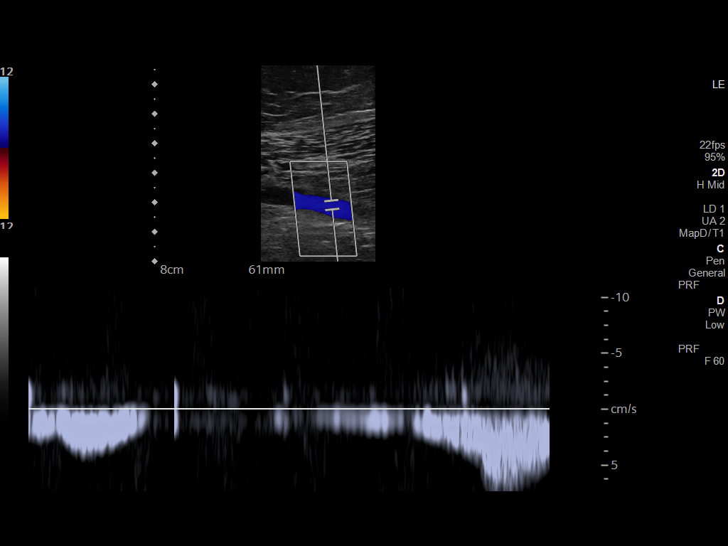
[im 15/26]
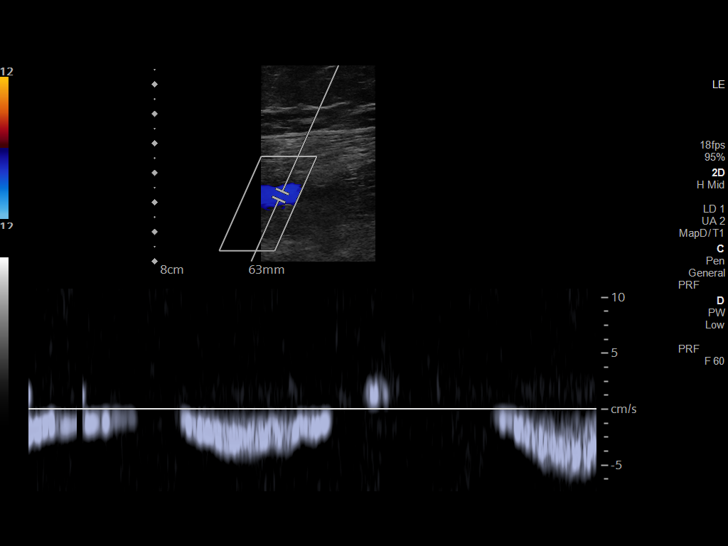
[im 17/26]
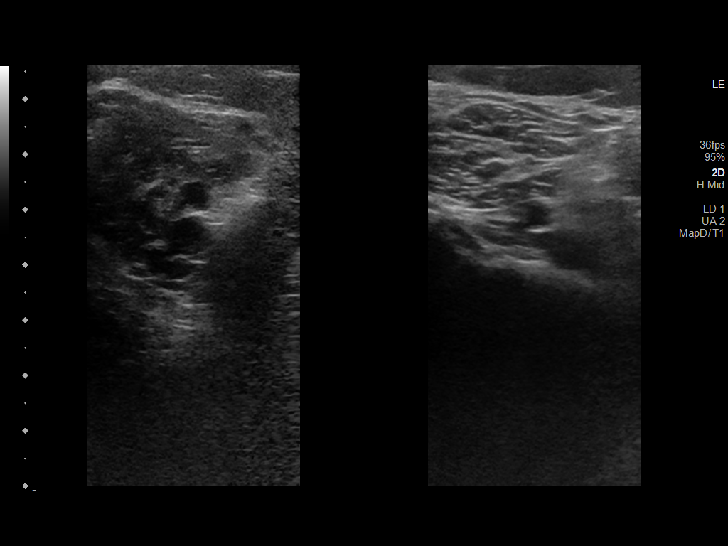
[im 19/26]
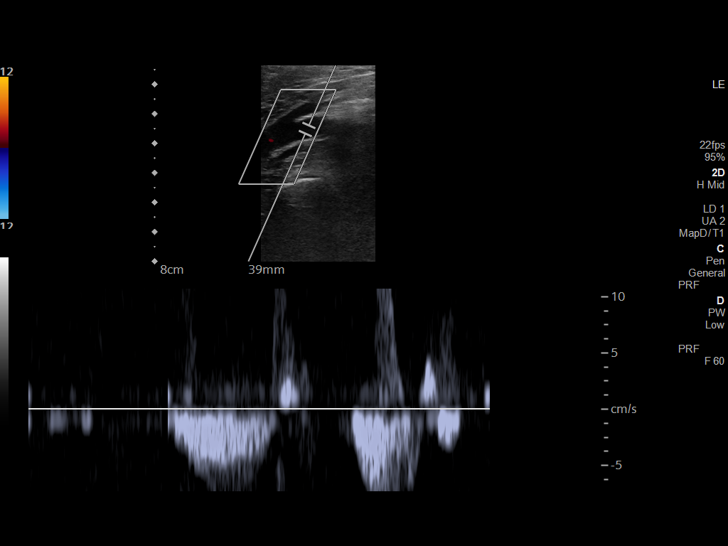
[im 21/26]
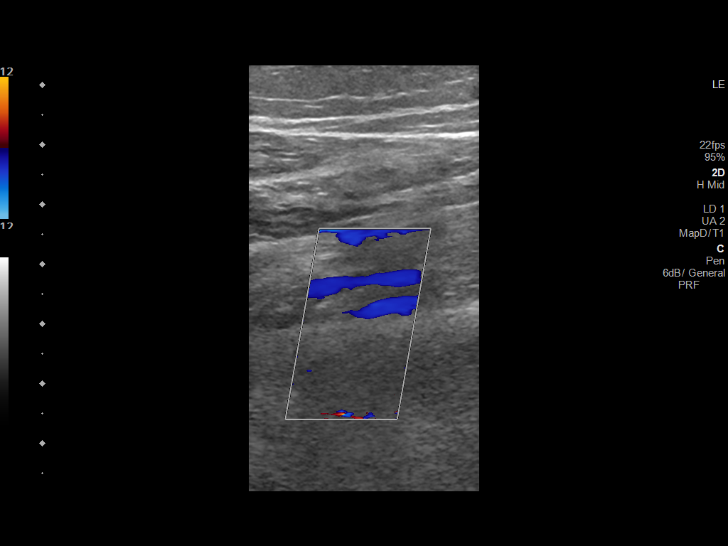
[im 23/26]
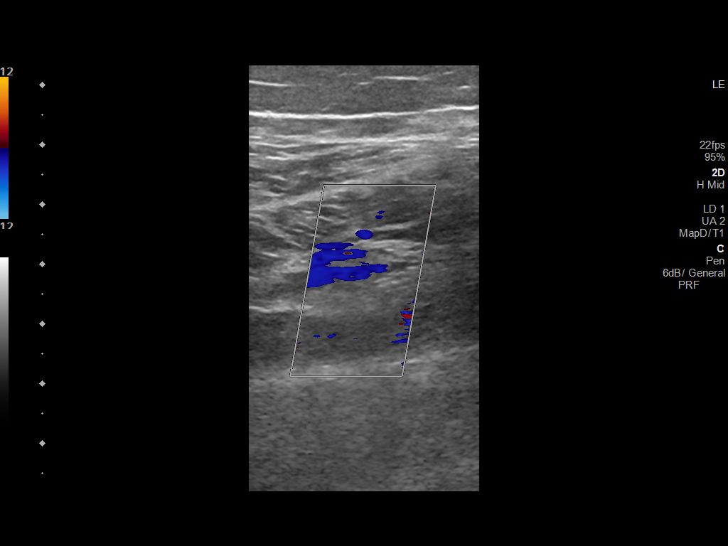
[im 26/26]
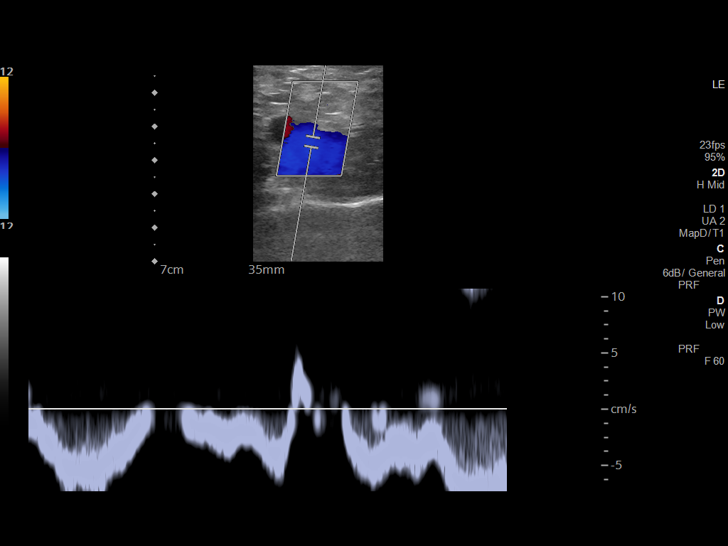

[13 of 24 positions shown; findings below may reference images not displayed]

FINDINGS: Contralateral Common Femoral Vein: Respiratory phasicity is normal
and symmetric with the symptomatic side. No evidence of thrombus.
Normal compressibility.

Common Femoral Vein: No evidence of thrombus. Normal
compressibility, respiratory phasicity and response to augmentation.

Saphenofemoral Junction: No evidence of thrombus. Normal
compressibility and flow on color Doppler imaging.

Profunda Femoral Vein: No evidence of thrombus. Normal
compressibility and flow on color Doppler imaging.

Femoral Vein: No evidence of thrombus. Normal compressibility,
respiratory phasicity and response to augmentation.

Popliteal Vein: No evidence of thrombus. Normal compressibility,
respiratory phasicity and response to augmentation.

Calf Veins: No evidence of thrombus. Normal compressibility and flow
on color Doppler imaging.

Superficial Great Saphenous Vein: No evidence of thrombus. Normal
compressibility.

Venous Reflux:  None.

Other Findings:  None.
IMPRESSION: No evidence of DVT within the left lower extremity.

## 2021-01-11 DIAGNOSIS — R002 Palpitations: Secondary | ICD-10-CM | POA: Diagnosis not present

## 2021-01-11 DIAGNOSIS — J029 Acute pharyngitis, unspecified: Secondary | ICD-10-CM | POA: Diagnosis not present

## 2021-01-11 DIAGNOSIS — J069 Acute upper respiratory infection, unspecified: Secondary | ICD-10-CM | POA: Diagnosis not present

## 2021-01-11 DIAGNOSIS — Z20822 Contact with and (suspected) exposure to covid-19: Secondary | ICD-10-CM | POA: Diagnosis not present

## 2021-01-11 DIAGNOSIS — J3489 Other specified disorders of nose and nasal sinuses: Secondary | ICD-10-CM | POA: Diagnosis not present

## 2021-01-11 DIAGNOSIS — E611 Iron deficiency: Secondary | ICD-10-CM | POA: Diagnosis not present

## 2021-01-12 ENCOUNTER — Emergency Department (HOSPITAL_BASED_OUTPATIENT_CLINIC_OR_DEPARTMENT_OTHER): Payer: Medicare HMO

## 2021-01-12 ENCOUNTER — Encounter (HOSPITAL_BASED_OUTPATIENT_CLINIC_OR_DEPARTMENT_OTHER): Payer: Self-pay

## 2021-01-12 ENCOUNTER — Other Ambulatory Visit: Payer: Self-pay

## 2021-01-12 ENCOUNTER — Emergency Department (HOSPITAL_BASED_OUTPATIENT_CLINIC_OR_DEPARTMENT_OTHER)
Admission: EM | Admit: 2021-01-12 | Discharge: 2021-01-12 | Disposition: A | Discharge status: Home / Self Care | Payer: Medicare HMO | Attending: Emergency Medicine | Admitting: Emergency Medicine

## 2021-01-12 ENCOUNTER — Other Ambulatory Visit (HOSPITAL_BASED_OUTPATIENT_CLINIC_OR_DEPARTMENT_OTHER): Payer: Self-pay

## 2021-01-12 DIAGNOSIS — W08XXXA Fall from other furniture, initial encounter: Secondary | ICD-10-CM | POA: Insufficient documentation

## 2021-01-12 DIAGNOSIS — I10 Essential (primary) hypertension: Secondary | ICD-10-CM | POA: Insufficient documentation

## 2021-01-12 DIAGNOSIS — M549 Dorsalgia, unspecified: Secondary | ICD-10-CM

## 2021-01-12 DIAGNOSIS — J029 Acute pharyngitis, unspecified: Secondary | ICD-10-CM | POA: Diagnosis not present

## 2021-01-12 DIAGNOSIS — Y92009 Unspecified place in unspecified non-institutional (private) residence as the place of occurrence of the external cause: Secondary | ICD-10-CM | POA: Insufficient documentation

## 2021-01-12 DIAGNOSIS — R0600 Dyspnea, unspecified: Secondary | ICD-10-CM | POA: Diagnosis not present

## 2021-01-12 DIAGNOSIS — Z86011 Personal history of benign neoplasm of the brain: Secondary | ICD-10-CM | POA: Diagnosis not present

## 2021-01-12 DIAGNOSIS — Z8616 Personal history of COVID-19: Secondary | ICD-10-CM | POA: Insufficient documentation

## 2021-01-12 DIAGNOSIS — Z87891 Personal history of nicotine dependence: Secondary | ICD-10-CM | POA: Insufficient documentation

## 2021-01-12 DIAGNOSIS — M545 Low back pain, unspecified: Secondary | ICD-10-CM | POA: Insufficient documentation

## 2021-01-12 DIAGNOSIS — Z79899 Other long term (current) drug therapy: Secondary | ICD-10-CM | POA: Insufficient documentation

## 2021-01-12 DIAGNOSIS — W19XXXA Unspecified fall, initial encounter: Secondary | ICD-10-CM

## 2021-01-12 DIAGNOSIS — R0602 Shortness of breath: Secondary | ICD-10-CM | POA: Diagnosis not present

## 2021-01-12 DIAGNOSIS — Z7951 Long term (current) use of inhaled steroids: Secondary | ICD-10-CM | POA: Diagnosis not present

## 2021-01-12 DIAGNOSIS — Z8542 Personal history of malignant neoplasm of other parts of uterus: Secondary | ICD-10-CM | POA: Diagnosis not present

## 2021-01-12 DIAGNOSIS — J45909 Unspecified asthma, uncomplicated: Secondary | ICD-10-CM | POA: Diagnosis not present

## 2021-01-12 MED ORDER — CYCLOBENZAPRINE HCL 10 MG PO TABS: 10.0000 mg | ORAL_TABLET | Freq: Two times a day (BID) | ORAL | 0 refills | Status: DC | PRN
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
## 2021-01-12 NOTE — ED Triage Notes (Signed)
Pt reports a fall at home 2 days ago.  Reports back pain and shortness of breath.

## 2021-01-13 ENCOUNTER — Telehealth: Payer: Self-pay | Admitting: Family Medicine

## 2021-01-13 DIAGNOSIS — Z87891 Personal history of nicotine dependence: Secondary | ICD-10-CM | POA: Diagnosis not present

## 2021-01-13 DIAGNOSIS — R072 Precordial pain: Secondary | ICD-10-CM | POA: Diagnosis not present

## 2021-01-13 DIAGNOSIS — J029 Acute pharyngitis, unspecified: Secondary | ICD-10-CM | POA: Diagnosis not present

## 2021-01-13 DIAGNOSIS — R61 Generalized hyperhidrosis: Secondary | ICD-10-CM | POA: Diagnosis not present

## 2021-01-13 DIAGNOSIS — R9431 Abnormal electrocardiogram [ECG] [EKG]: Secondary | ICD-10-CM | POA: Diagnosis not present

## 2021-01-13 DIAGNOSIS — R0602 Shortness of breath: Secondary | ICD-10-CM | POA: Diagnosis not present

## 2021-01-13 DIAGNOSIS — R0600 Dyspnea, unspecified: Secondary | ICD-10-CM | POA: Diagnosis not present

## 2021-01-13 NOTE — Telephone Encounter (Signed)
Patient states that there might be a possibility that she comes in person. She took 3 covid test and it was all negative. She is having issues breathing and took steroids but she may want to be looked at. Wanted to give PCP a heads up.

## 2021-01-14 ENCOUNTER — Telehealth (INDEPENDENT_AMBULATORY_CARE_PROVIDER_SITE_OTHER): Payer: Medicare HMO | Admitting: Family Medicine

## 2021-01-14 ENCOUNTER — Encounter: Payer: Self-pay | Admitting: Family Medicine

## 2021-01-14 VITALS — BP 157/94 | HR 81 | Ht 63.0 in | Wt 221.0 lb

## 2021-01-14 DIAGNOSIS — J3089 Other allergic rhinitis: Secondary | ICD-10-CM

## 2021-01-14 DIAGNOSIS — R9431 Abnormal electrocardiogram [ECG] [EKG]: Secondary | ICD-10-CM | POA: Diagnosis not present

## 2021-01-14 DIAGNOSIS — J069 Acute upper respiratory infection, unspecified: Secondary | ICD-10-CM | POA: Diagnosis not present

## 2021-01-14 DIAGNOSIS — R0602 Shortness of breath: Secondary | ICD-10-CM

## 2021-01-14 DIAGNOSIS — R21 Rash and other nonspecific skin eruption: Secondary | ICD-10-CM

## 2021-01-14 MED ORDER — CLOTRIMAZOLE-BETAMETHASONE 1-0.05 % EX CREA: 1.0000 "application " | TOPICAL_CREAM | Freq: Two times a day (BID) | CUTANEOUS | 0 refills | Status: DC | PRN

## 2021-01-14 MED ORDER — AMBULATORY NON FORMULARY MEDICATION: 0 refills | Status: DC

## 2021-01-14 MED ORDER — NYSTATIN 100000 UNIT/ML MT SUSP: 5.0000 mL | Freq: Four times a day (QID) | OROMUCOSAL | 0 refills | Status: DC

## 2021-01-14 NOTE — Progress Notes (Signed)
Pt reports that she has been coughing a lot and has drainage/SOB she has been tested for COVID at the ED and she has tested at home and all tests have been negative.  She feels that her breathing has become more of a problem in the heat since she contracted COVID. She denies any f/s/c. She has taken an expectorant. She wanted to know if there is a short acting medication that she can take for allergies?   She also questions if it is her sinuses. She has been using her sinus rinse which has cleared mucus out. She stated that the drainage had a light green/yellow color to it.   She continues to wear her mask even when she is outdoors in close proximity of others.   She is unsure if it is allergies or asthma. It was suggested that she get in with an allergist but she isn't able to drive on the highway.    She hasn't gotten her LOOP recorder done due to not feeling well. She is being rescheduled for this.  Pt brought in home bp cuff: 149/103 p:83

## 2021-01-14 NOTE — Assessment & Plan Note (Signed)
We discussed options for oral antihistamines I would recommend something like Claritin which is more mild she would prefer something more short acting so recommended Benadryl which only last for about 6 hours.  No can be sedating.
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
## 2021-01-17 ENCOUNTER — Encounter (HOSPITAL_BASED_OUTPATIENT_CLINIC_OR_DEPARTMENT_OTHER): Payer: Self-pay

## 2021-01-17 ENCOUNTER — Emergency Department (HOSPITAL_BASED_OUTPATIENT_CLINIC_OR_DEPARTMENT_OTHER): Payer: Medicare HMO

## 2021-01-17 ENCOUNTER — Other Ambulatory Visit: Payer: Self-pay

## 2021-01-17 ENCOUNTER — Emergency Department (HOSPITAL_BASED_OUTPATIENT_CLINIC_OR_DEPARTMENT_OTHER)
Admission: EM | Admit: 2021-01-17 | Discharge: 2021-01-18 | Disposition: A | Discharge status: Home / Self Care | Payer: Medicare HMO | Attending: Emergency Medicine | Admitting: Emergency Medicine

## 2021-01-17 DIAGNOSIS — I10 Essential (primary) hypertension: Secondary | ICD-10-CM | POA: Insufficient documentation

## 2021-01-17 DIAGNOSIS — S2231XA Fracture of one rib, right side, initial encounter for closed fracture: Secondary | ICD-10-CM | POA: Diagnosis not present

## 2021-01-17 DIAGNOSIS — J45909 Unspecified asthma, uncomplicated: Secondary | ICD-10-CM | POA: Insufficient documentation

## 2021-01-17 DIAGNOSIS — Z79899 Other long term (current) drug therapy: Secondary | ICD-10-CM | POA: Diagnosis not present

## 2021-01-17 DIAGNOSIS — R059 Cough, unspecified: Secondary | ICD-10-CM | POA: Diagnosis not present

## 2021-01-17 DIAGNOSIS — Z7951 Long term (current) use of inhaled steroids: Secondary | ICD-10-CM | POA: Diagnosis not present

## 2021-01-17 DIAGNOSIS — Z87891 Personal history of nicotine dependence: Secondary | ICD-10-CM | POA: Insufficient documentation

## 2021-01-17 DIAGNOSIS — Z8616 Personal history of COVID-19: Secondary | ICD-10-CM | POA: Diagnosis not present

## 2021-01-17 DIAGNOSIS — B349 Viral infection, unspecified: Secondary | ICD-10-CM | POA: Diagnosis not present

## 2021-01-17 DIAGNOSIS — R0981 Nasal congestion: Secondary | ICD-10-CM | POA: Diagnosis not present

## 2021-01-17 DIAGNOSIS — R0602 Shortness of breath: Secondary | ICD-10-CM | POA: Insufficient documentation

## 2021-01-17 LAB — FUNGAL STAIN
FUNGAL SMEAR:: NONE SEEN
MICRO NUMBER:: 11968659
SPECIMEN QUALITY:: ADEQUATE

## 2021-01-17 NOTE — ED Notes (Signed)
Pt states she cannot wear our gowns as they make her break out
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
## 2021-01-17 NOTE — ED Triage Notes (Addendum)
Pt c/o prod cough, sinus congestion, "right lung pain", low O2 sats x 1 week-states she has been at [facility] ED for same-last ED visit today-pt NAD-steady gait

## 2021-01-17 NOTE — Discharge Instructions (Signed)
You came to the emergency department today due to concerns for low oxygen saturation, nasal congestion and shortness of breath.  Your physical exam was reassuring.  Your oxygen saturations were in normal range during your time in the emergency department.  Chest x-ray was reassuring.  Please follow-up with your primary care provider if your symptoms do not improve.  Please keep your appointment to see your pulmonologist next week.  Get help right away if: Your shortness of breath gets worse. You have shortness of breath when you are resting. You feel light-headed or you faint. You have a cough that is not controlled with medicines. You cough up blood. You have pain with breathing. You have pain in your chest, arms, shoulders, or abdomen. You have a fever. You cannot walk up stairs or exercise the way that you normally do.

## 2021-01-18 ENCOUNTER — Encounter: Payer: Self-pay | Admitting: Family Medicine

## 2021-01-18 ENCOUNTER — Telehealth: Payer: Self-pay | Admitting: *Deleted

## 2021-01-18 NOTE — Telephone Encounter (Signed)
Received VM asking for additional information regarding the order for pt's wheelchair order.   Asking for office notes for pt.   Information faxed [phone]

## 2021-01-19 ENCOUNTER — Telehealth: Payer: Self-pay | Admitting: Allergy

## 2021-01-19 ENCOUNTER — Ambulatory Visit: Payer: Medicare HMO | Admitting: Allergy

## 2021-01-19 DIAGNOSIS — Z6841 Body Mass Index (BMI) 40.0 and over, adult: Secondary | ICD-10-CM | POA: Diagnosis not present

## 2021-01-19 DIAGNOSIS — Z79899 Other long term (current) drug therapy: Secondary | ICD-10-CM | POA: Diagnosis not present

## 2021-01-19 DIAGNOSIS — J3089 Other allergic rhinitis: Secondary | ICD-10-CM | POA: Diagnosis not present

## 2021-01-19 DIAGNOSIS — J4550 Severe persistent asthma, uncomplicated: Secondary | ICD-10-CM | POA: Diagnosis not present

## 2021-01-19 DIAGNOSIS — R059 Cough, unspecified: Secondary | ICD-10-CM | POA: Diagnosis not present

## 2021-01-19 DIAGNOSIS — F419 Anxiety disorder, unspecified: Secondary | ICD-10-CM | POA: Diagnosis not present

## 2021-01-19 DIAGNOSIS — Z8669 Personal history of other diseases of the nervous system and sense organs: Secondary | ICD-10-CM | POA: Diagnosis not present

## 2021-01-19 NOTE — Telephone Encounter (Signed)
Clled pt back and left message that a copy of her allergy testing result was mailed to her and that she needs to make an appt, to discuss with the provider of any further testing she may need. To call with any further questions as well.

## 2021-01-19 NOTE — Telephone Encounter (Signed)
Thanks and agree 

## 2021-01-19 NOTE — Telephone Encounter (Signed)
Patient had an appointment on 01-19-2021 at 11:20 Patient came in at 8:30 stating she thought appointment was at 8:30 advised new patients was coming in Dr. Nelva Bush advised she would need to wait until her appointment time Patient cancelled appointment Patient is now calling back stating she needs more allergy testing asking to speck to the nurse to see what allergy test she has had in the past and advice on what testing she needs in the future please advise

## 2021-01-20 ENCOUNTER — Telehealth: Payer: Self-pay | Admitting: Family Medicine

## 2021-01-20 DIAGNOSIS — R079 Chest pain, unspecified: Secondary | ICD-10-CM | POA: Diagnosis not present

## 2021-01-20 DIAGNOSIS — F419 Anxiety disorder, unspecified: Secondary | ICD-10-CM | POA: Diagnosis not present

## 2021-01-20 DIAGNOSIS — J4520 Mild intermittent asthma, uncomplicated: Secondary | ICD-10-CM | POA: Diagnosis not present

## 2021-01-20 DIAGNOSIS — Z87891 Personal history of nicotine dependence: Secondary | ICD-10-CM | POA: Diagnosis not present

## 2021-01-20 DIAGNOSIS — J0140 Acute pansinusitis, unspecified: Secondary | ICD-10-CM | POA: Diagnosis not present

## 2021-01-20 DIAGNOSIS — J302 Other seasonal allergic rhinitis: Secondary | ICD-10-CM | POA: Diagnosis not present

## 2021-01-20 DIAGNOSIS — S2231XA Fracture of one rib, right side, initial encounter for closed fracture: Secondary | ICD-10-CM | POA: Diagnosis not present

## 2021-01-20 NOTE — Telephone Encounter (Signed)
Pt called with 2 questions....  She states her asthma is still giving her a hard time and wants to know if Dr. Madilyn Fireman will order her a D-Dimer or Blood Gas at [facility]? She also states that [employer] told her they have sent sent over paperwork to [place] for her to get a wheelchair and if we don't respond, they will close her paperwork out.  Please advise patient

## 2021-01-20 NOTE — Telephone Encounter (Signed)
We can order a D-dimer but not a blood gas.  Those are only done in the hospital.  I will check on the paperwork for the wheelchair.

## 2021-01-21 ENCOUNTER — Ambulatory Visit: Payer: Medicare HMO | Admitting: Allergy and Immunology

## 2021-01-21 DIAGNOSIS — R9431 Abnormal electrocardiogram [ECG] [EKG]: Secondary | ICD-10-CM | POA: Diagnosis not present

## 2021-01-23 DIAGNOSIS — R0602 Shortness of breath: Secondary | ICD-10-CM | POA: Diagnosis not present

## 2021-01-23 DIAGNOSIS — R0781 Pleurodynia: Secondary | ICD-10-CM | POA: Diagnosis not present

## 2021-01-24 DIAGNOSIS — R0602 Shortness of breath: Secondary | ICD-10-CM | POA: Diagnosis not present

## 2021-01-25 DIAGNOSIS — J4520 Mild intermittent asthma, uncomplicated: Secondary | ICD-10-CM | POA: Diagnosis not present

## 2021-01-26 DIAGNOSIS — I10 Essential (primary) hypertension: Secondary | ICD-10-CM | POA: Diagnosis not present

## 2021-01-26 DIAGNOSIS — R0902 Hypoxemia: Secondary | ICD-10-CM | POA: Diagnosis not present

## 2021-01-26 DIAGNOSIS — K760 Fatty (change of) liver, not elsewhere classified: Secondary | ICD-10-CM | POA: Diagnosis not present

## 2021-01-26 DIAGNOSIS — R0600 Dyspnea, unspecified: Secondary | ICD-10-CM | POA: Diagnosis not present

## 2021-01-26 DIAGNOSIS — R0789 Other chest pain: Secondary | ICD-10-CM | POA: Diagnosis not present

## 2021-01-26 DIAGNOSIS — I700 Atherosclerosis of aorta: Secondary | ICD-10-CM | POA: Diagnosis not present

## 2021-01-26 DIAGNOSIS — G4489 Other headache syndrome: Secondary | ICD-10-CM | POA: Diagnosis not present

## 2021-01-26 DIAGNOSIS — R079 Chest pain, unspecified: Secondary | ICD-10-CM | POA: Diagnosis not present

## 2021-01-27 ENCOUNTER — Telehealth: Payer: Self-pay | Admitting: *Deleted

## 2021-01-27 DIAGNOSIS — R079 Chest pain, unspecified: Secondary | ICD-10-CM | POA: Diagnosis not present

## 2021-01-27 NOTE — Telephone Encounter (Signed)
Spoke w/pt she is ok with changing appt time. She asked that it be changed to a virtual just in case she can't make it into the office. However, if she can make it in she will get her to be seen.

## 2021-01-27 NOTE — Telephone Encounter (Signed)
No problem. What ever works best for her.

## 2021-01-28 ENCOUNTER — Telehealth: Payer: Medicare HMO | Admitting: Family Medicine

## 2021-01-28 ENCOUNTER — Ambulatory Visit: Payer: Medicare HMO | Admitting: Allergy and Immunology

## 2021-01-28 DIAGNOSIS — J689 Unspecified respiratory condition due to chemicals, gases, fumes and vapors: Secondary | ICD-10-CM | POA: Diagnosis not present

## 2021-01-28 DIAGNOSIS — T7589XA Other specified effects of external causes, initial encounter: Secondary | ICD-10-CM | POA: Diagnosis not present

## 2021-01-28 DIAGNOSIS — T5994XA Toxic effect of unspecified gases, fumes and vapors, undetermined, initial encounter: Secondary | ICD-10-CM | POA: Diagnosis not present

## 2021-01-29 DIAGNOSIS — R0602 Shortness of breath: Secondary | ICD-10-CM | POA: Diagnosis not present

## 2021-01-29 DIAGNOSIS — Z5321 Procedure and treatment not carried out due to patient leaving prior to being seen by health care provider: Secondary | ICD-10-CM | POA: Diagnosis not present

## 2021-01-29 DIAGNOSIS — R002 Palpitations: Secondary | ICD-10-CM | POA: Diagnosis not present

## 2021-01-30 ENCOUNTER — Other Ambulatory Visit: Payer: Self-pay

## 2021-01-30 ENCOUNTER — Emergency Department (HOSPITAL_BASED_OUTPATIENT_CLINIC_OR_DEPARTMENT_OTHER)
Admission: EM | Admit: 2021-01-30 | Discharge: 2021-01-30 | Disposition: A | Discharge status: Home / Self Care | Payer: Medicare HMO | Attending: Emergency Medicine | Admitting: Emergency Medicine

## 2021-01-30 ENCOUNTER — Encounter (HOSPITAL_BASED_OUTPATIENT_CLINIC_OR_DEPARTMENT_OTHER): Payer: Self-pay | Admitting: Emergency Medicine

## 2021-01-30 ENCOUNTER — Emergency Department (HOSPITAL_BASED_OUTPATIENT_CLINIC_OR_DEPARTMENT_OTHER): Payer: Medicare HMO

## 2021-01-30 DIAGNOSIS — R072 Precordial pain: Secondary | ICD-10-CM

## 2021-01-30 DIAGNOSIS — Z8616 Personal history of COVID-19: Secondary | ICD-10-CM | POA: Diagnosis not present

## 2021-01-30 DIAGNOSIS — Z87891 Personal history of nicotine dependence: Secondary | ICD-10-CM | POA: Diagnosis not present

## 2021-01-30 DIAGNOSIS — Z7952 Long term (current) use of systemic steroids: Secondary | ICD-10-CM | POA: Insufficient documentation

## 2021-01-30 DIAGNOSIS — Z79899 Other long term (current) drug therapy: Secondary | ICD-10-CM | POA: Diagnosis not present

## 2021-01-30 DIAGNOSIS — K219 Gastro-esophageal reflux disease without esophagitis: Secondary | ICD-10-CM | POA: Diagnosis not present

## 2021-01-30 DIAGNOSIS — J45909 Unspecified asthma, uncomplicated: Secondary | ICD-10-CM | POA: Diagnosis not present

## 2021-01-30 DIAGNOSIS — Z8542 Personal history of malignant neoplasm of other parts of uterus: Secondary | ICD-10-CM | POA: Diagnosis not present

## 2021-01-30 DIAGNOSIS — I10 Essential (primary) hypertension: Secondary | ICD-10-CM | POA: Diagnosis not present

## 2021-01-30 DIAGNOSIS — R0602 Shortness of breath: Secondary | ICD-10-CM | POA: Insufficient documentation

## 2021-01-30 DIAGNOSIS — R079 Chest pain, unspecified: Secondary | ICD-10-CM | POA: Diagnosis not present

## 2021-01-30 DIAGNOSIS — R002 Palpitations: Secondary | ICD-10-CM

## 2021-01-30 LAB — BASIC METABOLIC PANEL
Anion gap: 7 (ref 5–15)
BUN: 16 mg/dL (ref 6–20)
CO2: 25 mmol/L (ref 22–32)
Calcium: 8.9 mg/dL (ref 8.9–10.3)
Chloride: 109 mmol/L (ref 98–111)
Creatinine, Ser: 0.8 mg/dL (ref 0.44–1.00)
GFR, Estimated: >60 mL/min (ref >60)
Glucose, Bld: 129 mg/dL — ABNORMAL HIGH (ref 70–99)
Potassium: 3.7 mmol/L (ref 3.5–5.1)
Sodium: 141 mmol/L (ref 135–145)

## 2021-01-30 LAB — CBC WITH DIFFERENTIAL/PLATELET
Abs Immature Granulocytes: 0.03 10*3/uL (ref 0.00–0.07)
Basophils Absolute: 0.1 10*3/uL (ref 0.0–0.1)
Basophils Relative: 1 %
Eosinophils Absolute: 0.2 10*3/uL (ref 0.0–0.5)
Eosinophils Relative: 3 %
HCT: 41.5 % (ref 36.0–46.0)
Hemoglobin: 13.7 g/dL (ref 12.0–15.0)
Immature Granulocytes: 0 %
Lymphocytes Relative: 20 %
Lymphs Abs: 1.4 10*3/uL (ref 0.7–4.0)
MCH: 29.7 pg (ref 26.0–34.0)
MCHC: 33 g/dL (ref 30.0–36.0)
MCV: 89.8 fL (ref 80.0–100.0)
Monocytes Absolute: 0.6 10*3/uL (ref 0.1–1.0)
Monocytes Relative: 9 %
Neutro Abs: 4.6 10*3/uL (ref 1.7–7.7)
Neutrophils Relative %: 67 %
Platelets: 191 10*3/uL (ref 150–400)
RBC: 4.62 MIL/uL (ref 3.87–5.11)
RDW: 14.4 % (ref 11.5–15.5)
WBC: 6.9 10*3/uL (ref 4.0–10.5)
nRBC: 0 % (ref 0.0–0.2)

## 2021-01-30 LAB — TROPONIN I (HIGH SENSITIVITY): Troponin I (High Sensitivity): 3 ng/L (ref <18)

## 2021-01-30 NOTE — Discharge Instructions (Addendum)
Follow-up with your cardiologist.  Today's work-up heart markers normal chest x-ray normal cardiac monitoring without arrhythmia.  Carbonmonoxide oximetry was normal.  And basic labs normal.

## 2021-01-30 NOTE — ED Notes (Signed)
RT was asked to place patient on potable CO monitor to do a spot check. Gave a reading of less than 3%. MD at bedside and aware.
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
## 2021-02-02 DIAGNOSIS — J029 Acute pharyngitis, unspecified: Secondary | ICD-10-CM | POA: Diagnosis not present

## 2021-02-02 DIAGNOSIS — Z87891 Personal history of nicotine dependence: Secondary | ICD-10-CM | POA: Diagnosis not present

## 2021-02-03 DIAGNOSIS — I10 Essential (primary) hypertension: Secondary | ICD-10-CM | POA: Diagnosis not present

## 2021-02-03 DIAGNOSIS — R079 Chest pain, unspecified: Secondary | ICD-10-CM | POA: Diagnosis not present

## 2021-02-03 DIAGNOSIS — Z79899 Other long term (current) drug therapy: Secondary | ICD-10-CM | POA: Diagnosis not present

## 2021-02-03 DIAGNOSIS — R002 Palpitations: Secondary | ICD-10-CM | POA: Diagnosis not present

## 2021-02-03 DIAGNOSIS — Z87891 Personal history of nicotine dependence: Secondary | ICD-10-CM | POA: Diagnosis not present

## 2021-02-03 DIAGNOSIS — Z8616 Personal history of COVID-19: Secondary | ICD-10-CM | POA: Diagnosis not present

## 2021-02-03 DIAGNOSIS — R072 Precordial pain: Secondary | ICD-10-CM | POA: Diagnosis not present

## 2021-02-03 DIAGNOSIS — R110 Nausea: Secondary | ICD-10-CM | POA: Diagnosis not present

## 2021-02-03 DIAGNOSIS — J45909 Unspecified asthma, uncomplicated: Secondary | ICD-10-CM | POA: Diagnosis not present

## 2021-02-03 DIAGNOSIS — R000 Tachycardia, unspecified: Secondary | ICD-10-CM | POA: Diagnosis not present

## 2021-02-03 DIAGNOSIS — R0602 Shortness of breath: Secondary | ICD-10-CM | POA: Diagnosis not present

## 2021-02-04 DIAGNOSIS — R079 Chest pain, unspecified: Secondary | ICD-10-CM | POA: Diagnosis not present

## 2021-02-08 DIAGNOSIS — R42 Dizziness and giddiness: Secondary | ICD-10-CM | POA: Diagnosis not present

## 2021-02-08 DIAGNOSIS — R0789 Other chest pain: Secondary | ICD-10-CM | POA: Diagnosis not present

## 2021-02-08 DIAGNOSIS — F450 Somatization disorder: Secondary | ICD-10-CM | POA: Diagnosis not present

## 2021-02-08 DIAGNOSIS — F419 Anxiety disorder, unspecified: Secondary | ICD-10-CM | POA: Diagnosis not present

## 2021-02-09 DIAGNOSIS — R9431 Abnormal electrocardiogram [ECG] [EKG]: Secondary | ICD-10-CM | POA: Diagnosis not present

## 2021-02-10 ENCOUNTER — Emergency Department (HOSPITAL_BASED_OUTPATIENT_CLINIC_OR_DEPARTMENT_OTHER)
Admission: EM | Admit: 2021-02-10 | Discharge: 2021-02-10 | Disposition: A | Discharge status: Home / Self Care | Payer: Medicare HMO | Attending: Emergency Medicine | Admitting: Emergency Medicine

## 2021-02-10 ENCOUNTER — Encounter (HOSPITAL_BASED_OUTPATIENT_CLINIC_OR_DEPARTMENT_OTHER): Payer: Self-pay | Admitting: Obstetrics and Gynecology

## 2021-02-10 ENCOUNTER — Other Ambulatory Visit: Payer: Self-pay

## 2021-02-10 DIAGNOSIS — D320 Benign neoplasm of cerebral meninges: Secondary | ICD-10-CM | POA: Diagnosis not present

## 2021-02-10 DIAGNOSIS — R140 Abdominal distension (gaseous): Secondary | ICD-10-CM | POA: Insufficient documentation

## 2021-02-10 DIAGNOSIS — Z79899 Other long term (current) drug therapy: Secondary | ICD-10-CM | POA: Diagnosis not present

## 2021-02-10 DIAGNOSIS — Z7951 Long term (current) use of inhaled steroids: Secondary | ICD-10-CM | POA: Insufficient documentation

## 2021-02-10 DIAGNOSIS — R0602 Shortness of breath: Secondary | ICD-10-CM | POA: Diagnosis not present

## 2021-02-10 DIAGNOSIS — R079 Chest pain, unspecified: Secondary | ICD-10-CM | POA: Diagnosis not present

## 2021-02-10 DIAGNOSIS — G8929 Other chronic pain: Secondary | ICD-10-CM

## 2021-02-10 DIAGNOSIS — I10 Essential (primary) hypertension: Secondary | ICD-10-CM | POA: Diagnosis not present

## 2021-02-10 DIAGNOSIS — X58XXXA Exposure to other specified factors, initial encounter: Secondary | ICD-10-CM | POA: Diagnosis not present

## 2021-02-10 DIAGNOSIS — Z87891 Personal history of nicotine dependence: Secondary | ICD-10-CM | POA: Insufficient documentation

## 2021-02-10 DIAGNOSIS — R110 Nausea: Secondary | ICD-10-CM | POA: Insufficient documentation

## 2021-02-10 DIAGNOSIS — J45909 Unspecified asthma, uncomplicated: Secondary | ICD-10-CM | POA: Diagnosis not present

## 2021-02-10 DIAGNOSIS — S90121A Contusion of right lesser toe(s) without damage to nail, initial encounter: Secondary | ICD-10-CM | POA: Diagnosis not present

## 2021-02-10 DIAGNOSIS — R531 Weakness: Secondary | ICD-10-CM | POA: Insufficient documentation

## 2021-02-10 DIAGNOSIS — J323 Chronic sphenoidal sinusitis: Secondary | ICD-10-CM | POA: Diagnosis not present

## 2021-02-10 DIAGNOSIS — R519 Headache, unspecified: Secondary | ICD-10-CM | POA: Insufficient documentation

## 2021-02-10 DIAGNOSIS — M7731 Calcaneal spur, right foot: Secondary | ICD-10-CM | POA: Diagnosis not present

## 2021-02-10 NOTE — ED Triage Notes (Signed)
Patient reports she feels "off in the head". Patient reports pressure and pain in the head, shortness of breath, chest pain and has been feeling swollen in the belly and feel like she is "in the matritx" like her head is going back and forward. Patient reports she thinks she also broke her second toe on the right foot and has pain in the right foot. Patient reports she has a loop recorder placed last week and she is having pain at the site.

## 2021-02-10 NOTE — ED Notes (Addendum)
Patient deferred d/c paperwork and d/c vitals. Patient asked RN to shred armband and d/c papers. Patient reports she 'wasted her time' and 'is frustrated'
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
## 2021-02-11 ENCOUNTER — Telehealth: Payer: Self-pay | Admitting: Family Medicine

## 2021-02-11 ENCOUNTER — Encounter: Payer: Self-pay | Admitting: Family Medicine

## 2021-02-11 ENCOUNTER — Telehealth (INDEPENDENT_AMBULATORY_CARE_PROVIDER_SITE_OTHER): Payer: Medicare HMO | Admitting: Family Medicine

## 2021-02-11 DIAGNOSIS — G35 Multiple sclerosis: Secondary | ICD-10-CM

## 2021-02-11 DIAGNOSIS — J0191 Acute recurrent sinusitis, unspecified: Secondary | ICD-10-CM | POA: Diagnosis not present

## 2021-02-11 DIAGNOSIS — R140 Abdominal distension (gaseous): Secondary | ICD-10-CM | POA: Diagnosis not present

## 2021-02-11 DIAGNOSIS — F339 Major depressive disorder, recurrent, unspecified: Secondary | ICD-10-CM | POA: Diagnosis not present

## 2021-02-11 DIAGNOSIS — R519 Headache, unspecified: Secondary | ICD-10-CM | POA: Diagnosis not present

## 2021-02-11 DIAGNOSIS — M7731 Calcaneal spur, right foot: Secondary | ICD-10-CM | POA: Diagnosis not present

## 2021-02-11 MED ORDER — DOXYCYCLINE HYCLATE 50 MG PO CAPS: 50.0000 mg | ORAL_CAPSULE | Freq: Two times a day (BID) | ORAL | 0 refills | Status: DC

## 2021-02-11 NOTE — Progress Notes (Signed)
Virtual Visit via Video Note  I connected with Jennifer [name] on 02/11/21 at  1:40 PM EDT by a video enabled telemedicine application and verified that I am speaking with the correct person using two identifiers.   I discussed the limitations of evaluation and management by telemedicine and the availability of in person appointments. The patient expressed understanding and agreed to proceed.  Patient location: at home Provider location: at home  Subjective:    CC: Headache  HPI: Pressure in her head when would lean forward or when leans back.  Feels mostly in the back of her head.  Did go to ED last night and they did a CT of head.  Shows mild to moderate left sphenoid sinusitis, progressed since last months.  BP was really high when went to ED.  Feels like her gait and balance has been off so has been using her walker.   Had her loop recorder placed on 02/03/2021. Has f/u next week for palpitations.   Has been very bloating. Says having some abdominal pain as well.  Did have some cheese earlier this week.    Not sure if has rescheduled with pain management yet. Had to cancel her.  Also recently had a gas leak in her apartment that caused some symptoms.    Asked that we work on prescription for lift chair to submit to [facility].  Can fix the one she has that was through the [facility].  Say the technician came out and said it can be repaired.   It is a "lemon".   Past medical history, Surgical history, Family history not pertinant except as noted below, Social history, Allergies, and medications have been entered into the medical record, reviewed, and corrections made.    Objective:    General: Speaking clearly in complete sentences without any shortness of breath.  Alert and oriented x3.  Normal judgment. No apparent acute distress.    Impression and Recommendations:    Acute recurrent sinusitis Discussed options.  She has multiple allergies. She is willing to retry doxy.  Had nausea with it.  Ok to use Zofran with the medication. She preferred to take the 50mg  instead of the 100mg .  Call if  Not improving. Can can tolerate clindamycin.    Multiple sclerosis ([place]) Can work on submitting new order for lift chair through [facility].  Depression, recurrent ([place]) Discussed continue to work on getting mental health services. Has been really more tearful lately.  Has tried to call her insurance but can't find places that take her medicare right now.   Bloating - has GI appt later this summer.  Make sure avoiding trigger foods especially dairy she definitely has some food intolerances.    No orders of the defined types were placed in this encounter.   Meds ordered this encounter  Medications   doxycycline (VIBRAMYCIN) 50 MG capsule    Sig: Take 1 capsule (50 mg total) by mouth 2 (two) times daily.    Dispense:  20 capsule    Refill:  0     I discussed the assessment and treatment plan with the patient. The patient was provided an opportunity to ask questions and all were answered. The patient agreed with the plan and demonstrated an understanding of the instructions.   The patient was advised to call back or seek an in-person evaluation if the symptoms worsen or if the condition fails to improve as anticipated.   Beatrice Lecher, MD

## 2021-02-11 NOTE — Assessment & Plan Note (Signed)
Can work on submitting new order for lift chair through [employer].

## 2021-02-11 NOTE — Telephone Encounter (Signed)
Please reprint letter from I believe May 9 requesting chair lift.  And faxed to [facility] [employer].

## 2021-02-11 NOTE — Assessment & Plan Note (Signed)
Discussed options.  She has multiple allergies. She is willing to retry doxy. Had nausea with it.  Ok to use Zofran with the medication. She preferred to take the 50mg  instead of the 100mg .  Call if  Not improving. Can can tolerate clindamycin.

## 2021-02-11 NOTE — Assessment & Plan Note (Signed)
Discussed continue to work on getting mental health services. Has been really more tearful lately.  Has tried to call her insurance but can't find places that take her medicare right now.

## 2021-02-14 DIAGNOSIS — R079 Chest pain, unspecified: Secondary | ICD-10-CM | POA: Diagnosis not present

## 2021-02-14 DIAGNOSIS — R9431 Abnormal electrocardiogram [ECG] [EKG]: Secondary | ICD-10-CM | POA: Diagnosis not present

## 2021-02-14 DIAGNOSIS — Z79899 Other long term (current) drug therapy: Secondary | ICD-10-CM | POA: Diagnosis not present

## 2021-02-14 DIAGNOSIS — R531 Weakness: Secondary | ICD-10-CM | POA: Diagnosis not present

## 2021-02-14 DIAGNOSIS — M79602 Pain in left arm: Secondary | ICD-10-CM | POA: Diagnosis not present

## 2021-02-14 DIAGNOSIS — M542 Cervicalgia: Secondary | ICD-10-CM | POA: Diagnosis not present

## 2021-02-14 DIAGNOSIS — I10 Essential (primary) hypertension: Secondary | ICD-10-CM | POA: Diagnosis not present

## 2021-02-14 DIAGNOSIS — R002 Palpitations: Secondary | ICD-10-CM | POA: Diagnosis not present

## 2021-02-15 ENCOUNTER — Other Ambulatory Visit: Payer: Self-pay | Admitting: Family Medicine

## 2021-02-15 DIAGNOSIS — M542 Cervicalgia: Secondary | ICD-10-CM | POA: Diagnosis not present

## 2021-02-15 DIAGNOSIS — R079 Chest pain, unspecified: Secondary | ICD-10-CM | POA: Diagnosis not present

## 2021-02-15 DIAGNOSIS — R531 Weakness: Secondary | ICD-10-CM | POA: Diagnosis not present

## 2021-02-15 DIAGNOSIS — R002 Palpitations: Secondary | ICD-10-CM | POA: Diagnosis not present

## 2021-02-15 DIAGNOSIS — M79602 Pain in left arm: Secondary | ICD-10-CM | POA: Diagnosis not present

## 2021-02-15 NOTE — Telephone Encounter (Signed)
Faxed letter for chair lift.

## 2021-02-16 DIAGNOSIS — R000 Tachycardia, unspecified: Secondary | ICD-10-CM | POA: Diagnosis not present

## 2021-02-17 DIAGNOSIS — R9431 Abnormal electrocardiogram [ECG] [EKG]: Secondary | ICD-10-CM | POA: Diagnosis not present

## 2021-02-17 DIAGNOSIS — F419 Anxiety disorder, unspecified: Secondary | ICD-10-CM | POA: Diagnosis not present

## 2021-02-17 DIAGNOSIS — R200 Anesthesia of skin: Secondary | ICD-10-CM | POA: Diagnosis not present

## 2021-02-17 DIAGNOSIS — R519 Headache, unspecified: Secondary | ICD-10-CM | POA: Diagnosis not present

## 2021-02-17 DIAGNOSIS — M25512 Pain in left shoulder: Secondary | ICD-10-CM | POA: Diagnosis not present

## 2021-02-17 DIAGNOSIS — M79602 Pain in left arm: Secondary | ICD-10-CM | POA: Diagnosis not present

## 2021-02-18 DIAGNOSIS — J0130 Acute sphenoidal sinusitis, unspecified: Secondary | ICD-10-CM | POA: Diagnosis not present

## 2021-02-18 DIAGNOSIS — Z87891 Personal history of nicotine dependence: Secondary | ICD-10-CM | POA: Diagnosis not present

## 2021-02-18 DIAGNOSIS — I10 Essential (primary) hypertension: Secondary | ICD-10-CM | POA: Diagnosis not present

## 2021-02-18 DIAGNOSIS — E1165 Type 2 diabetes mellitus with hyperglycemia: Secondary | ICD-10-CM | POA: Diagnosis not present

## 2021-02-20 ENCOUNTER — Other Ambulatory Visit: Payer: Self-pay

## 2021-02-20 ENCOUNTER — Encounter (HOSPITAL_COMMUNITY): Payer: Self-pay | Admitting: Emergency Medicine

## 2021-02-20 ENCOUNTER — Emergency Department (HOSPITAL_COMMUNITY)
Admission: EM | Admit: 2021-02-20 | Discharge: 2021-02-21 | Disposition: A | Discharge status: Home / Self Care | Payer: Medicare HMO | Attending: Emergency Medicine | Admitting: Emergency Medicine

## 2021-02-20 ENCOUNTER — Emergency Department (HOSPITAL_COMMUNITY): Payer: Medicare HMO

## 2021-02-20 DIAGNOSIS — I10 Essential (primary) hypertension: Secondary | ICD-10-CM | POA: Diagnosis not present

## 2021-02-20 DIAGNOSIS — Z5321 Procedure and treatment not carried out due to patient leaving prior to being seen by health care provider: Secondary | ICD-10-CM | POA: Diagnosis not present

## 2021-02-20 DIAGNOSIS — R1013 Epigastric pain: Secondary | ICD-10-CM | POA: Diagnosis not present

## 2021-02-20 DIAGNOSIS — R079 Chest pain, unspecified: Secondary | ICD-10-CM | POA: Insufficient documentation

## 2021-02-20 DIAGNOSIS — R457 State of emotional shock and stress, unspecified: Secondary | ICD-10-CM | POA: Diagnosis not present

## 2021-02-20 DIAGNOSIS — R0789 Other chest pain: Secondary | ICD-10-CM | POA: Diagnosis not present

## 2021-02-20 LAB — BASIC METABOLIC PANEL
Anion gap: 12 (ref 5–15)
BUN: 11 mg/dL (ref 6–20)
CO2: 21 mmol/L — ABNORMAL LOW (ref 22–32)
Calcium: 9.1 mg/dL (ref 8.9–10.3)
Chloride: 107 mmol/L (ref 98–111)
Creatinine, Ser: 0.61 mg/dL (ref 0.44–1.00)
GFR, Estimated: >60 mL/min (ref >60)
Glucose, Bld: 104 mg/dL — ABNORMAL HIGH (ref 70–99)
Potassium: 3.9 mmol/L (ref 3.5–5.1)
Sodium: 140 mmol/L (ref 135–145)

## 2021-02-20 LAB — CBC
HCT: 45.9 % (ref 36.0–46.0)
Hemoglobin: 14.7 g/dL (ref 12.0–15.0)
MCH: 29.7 pg (ref 26.0–34.0)
MCHC: 32 g/dL (ref 30.0–36.0)
MCV: 92.7 fL (ref 80.0–100.0)
Platelets: ADEQUATE 10*3/uL (ref 150–400)
RBC: 4.95 MIL/uL (ref 3.87–5.11)
RDW: 13.7 % (ref 11.5–15.5)
WBC: 8.2 10*3/uL (ref 4.0–10.5)
nRBC: 0 % (ref 0.0–0.2)

## 2021-02-20 LAB — TROPONIN I (HIGH SENSITIVITY): Troponin I (High Sensitivity): 4 ng/L (ref <18)

## 2021-02-20 NOTE — ED Triage Notes (Addendum)
Difficulty obtaining triage. pt stated multiple times that she did not want to be seen here. She is upset that she was not taken to a room as told by EMS and has to wait in the lobby. Triage process discussed with pt. Pt verbalized understanding and has agreed to accept care at this time.   Pt c/o cp for past weeks worsening today. Pain under breast left arm and left jaw pain. No official cardiac history

## 2021-02-20 NOTE — ED Triage Notes (Addendum)
EMS report: home today CP epigastric area, frequent, seen at hospital several time for similar problems. No official diagnosis. Agitated in route. EKG NSR, no abnormalities. 1 SL NTG, 324 ASA. Unable to obtain IV. VS WDL.

## 2021-02-21 ENCOUNTER — Other Ambulatory Visit: Payer: Self-pay | Admitting: Family Medicine

## 2021-02-21 DIAGNOSIS — E785 Hyperlipidemia, unspecified: Secondary | ICD-10-CM | POA: Diagnosis not present

## 2021-02-21 DIAGNOSIS — Z8544 Personal history of malignant neoplasm of other female genital organs: Secondary | ICD-10-CM | POA: Diagnosis not present

## 2021-02-21 DIAGNOSIS — M5412 Radiculopathy, cervical region: Secondary | ICD-10-CM | POA: Diagnosis not present

## 2021-02-21 DIAGNOSIS — M542 Cervicalgia: Secondary | ICD-10-CM | POA: Diagnosis not present

## 2021-02-21 DIAGNOSIS — G35 Multiple sclerosis: Secondary | ICD-10-CM | POA: Diagnosis not present

## 2021-02-21 DIAGNOSIS — Z8542 Personal history of malignant neoplasm of other parts of uterus: Secondary | ICD-10-CM | POA: Diagnosis not present

## 2021-02-21 DIAGNOSIS — D509 Iron deficiency anemia, unspecified: Secondary | ICD-10-CM | POA: Diagnosis not present

## 2021-02-21 DIAGNOSIS — I471 Supraventricular tachycardia: Secondary | ICD-10-CM | POA: Diagnosis not present

## 2021-02-21 DIAGNOSIS — E2609 Other primary hyperaldosteronism: Secondary | ICD-10-CM | POA: Diagnosis not present

## 2021-02-21 DIAGNOSIS — I10 Essential (primary) hypertension: Secondary | ICD-10-CM | POA: Diagnosis not present

## 2021-02-21 DIAGNOSIS — M5030 Other cervical disc degeneration, unspecified cervical region: Secondary | ICD-10-CM | POA: Diagnosis not present

## 2021-02-21 DIAGNOSIS — Z8669 Personal history of other diseases of the nervous system and sense organs: Secondary | ICD-10-CM | POA: Diagnosis not present

## 2021-02-21 DIAGNOSIS — M5010 Cervical disc disorder with radiculopathy, unspecified cervical region: Secondary | ICD-10-CM | POA: Diagnosis not present

## 2021-02-21 DIAGNOSIS — E119 Type 2 diabetes mellitus without complications: Secondary | ICD-10-CM | POA: Diagnosis not present

## 2021-02-21 DIAGNOSIS — K760 Fatty (change of) liver, not elsewhere classified: Secondary | ICD-10-CM | POA: Diagnosis not present

## 2021-02-21 NOTE — ED Notes (Signed)
Called pt for vitals, no response x1

## 2021-02-23 DIAGNOSIS — G35 Multiple sclerosis: Secondary | ICD-10-CM | POA: Diagnosis not present

## 2021-02-23 DIAGNOSIS — M25512 Pain in left shoulder: Secondary | ICD-10-CM | POA: Diagnosis not present

## 2021-02-23 DIAGNOSIS — M542 Cervicalgia: Secondary | ICD-10-CM | POA: Diagnosis not present

## 2021-02-24 ENCOUNTER — Encounter: Payer: Self-pay | Admitting: Family Medicine

## 2021-02-24 ENCOUNTER — Other Ambulatory Visit: Payer: Self-pay | Admitting: *Deleted

## 2021-02-24 DIAGNOSIS — D691 Qualitative platelet defects: Secondary | ICD-10-CM

## 2021-02-24 DIAGNOSIS — Z79899 Other long term (current) drug therapy: Secondary | ICD-10-CM

## 2021-02-24 DIAGNOSIS — J4520 Mild intermittent asthma, uncomplicated: Secondary | ICD-10-CM | POA: Diagnosis not present

## 2021-02-25 ENCOUNTER — Ambulatory Visit (INDEPENDENT_AMBULATORY_CARE_PROVIDER_SITE_OTHER): Payer: Medicare HMO | Admitting: Family Medicine

## 2021-02-25 ENCOUNTER — Encounter: Payer: Self-pay | Admitting: Family Medicine

## 2021-02-25 VITALS — BP 155/76 | HR 97 | Ht 63.0 in | Wt 222.0 lb

## 2021-02-25 DIAGNOSIS — I10 Essential (primary) hypertension: Secondary | ICD-10-CM | POA: Diagnosis not present

## 2021-02-25 DIAGNOSIS — C519 Malignant neoplasm of vulva, unspecified: Secondary | ICD-10-CM | POA: Diagnosis not present

## 2021-02-25 DIAGNOSIS — M62838 Other muscle spasm: Secondary | ICD-10-CM | POA: Diagnosis not present

## 2021-02-25 DIAGNOSIS — R109 Unspecified abdominal pain: Secondary | ICD-10-CM

## 2021-02-25 NOTE — Assessment & Plan Note (Addendum)
BP not well controlled here. But reports better BP at home.
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
## 2021-02-25 NOTE — Assessment & Plan Note (Signed)
Has f/u appt next month with Dr. [name]

## 2021-02-28 DIAGNOSIS — M25512 Pain in left shoulder: Secondary | ICD-10-CM | POA: Diagnosis not present

## 2021-02-28 DIAGNOSIS — R9431 Abnormal electrocardiogram [ECG] [EKG]: Secondary | ICD-10-CM | POA: Diagnosis not present

## 2021-02-28 DIAGNOSIS — R079 Chest pain, unspecified: Secondary | ICD-10-CM | POA: Diagnosis not present

## 2021-02-28 DIAGNOSIS — G8929 Other chronic pain: Secondary | ICD-10-CM | POA: Diagnosis not present

## 2021-02-28 DIAGNOSIS — S99921A Unspecified injury of right foot, initial encounter: Secondary | ICD-10-CM | POA: Diagnosis not present

## 2021-02-28 DIAGNOSIS — R0789 Other chest pain: Secondary | ICD-10-CM | POA: Diagnosis not present

## 2021-02-28 DIAGNOSIS — M79674 Pain in right toe(s): Secondary | ICD-10-CM | POA: Diagnosis not present

## 2021-02-28 DIAGNOSIS — M5412 Radiculopathy, cervical region: Secondary | ICD-10-CM | POA: Diagnosis not present

## 2021-02-28 DIAGNOSIS — R0602 Shortness of breath: Secondary | ICD-10-CM | POA: Diagnosis not present

## 2021-03-01 DIAGNOSIS — R9431 Abnormal electrocardiogram [ECG] [EKG]: Secondary | ICD-10-CM | POA: Diagnosis not present

## 2021-03-03 ENCOUNTER — Telehealth: Payer: Self-pay

## 2021-03-03 DIAGNOSIS — E531 Pyridoxine deficiency: Secondary | ICD-10-CM

## 2021-03-03 DIAGNOSIS — D691 Qualitative platelet defects: Secondary | ICD-10-CM

## 2021-03-03 DIAGNOSIS — R209 Unspecified disturbances of skin sensation: Secondary | ICD-10-CM

## 2021-03-03 NOTE — Telephone Encounter (Signed)
Sheng called and states [facility] doesn't have the labs. She also wanted to know if Dr Madilyn Fireman would add on vitamin B panel.   What labs does she need?

## 2021-03-04 DIAGNOSIS — R1013 Epigastric pain: Secondary | ICD-10-CM | POA: Diagnosis not present

## 2021-03-04 DIAGNOSIS — E611 Iron deficiency: Secondary | ICD-10-CM | POA: Diagnosis not present

## 2021-03-04 DIAGNOSIS — Z20822 Contact with and (suspected) exposure to covid-19: Secondary | ICD-10-CM | POA: Diagnosis not present

## 2021-03-04 DIAGNOSIS — R1310 Dysphagia, unspecified: Secondary | ICD-10-CM | POA: Diagnosis not present

## 2021-03-04 DIAGNOSIS — R0602 Shortness of breath: Secondary | ICD-10-CM | POA: Diagnosis not present

## 2021-03-04 DIAGNOSIS — R42 Dizziness and giddiness: Secondary | ICD-10-CM | POA: Diagnosis not present

## 2021-03-04 DIAGNOSIS — M545 Low back pain, unspecified: Secondary | ICD-10-CM | POA: Diagnosis not present

## 2021-03-04 DIAGNOSIS — Z87891 Personal history of nicotine dependence: Secondary | ICD-10-CM | POA: Diagnosis not present

## 2021-03-04 DIAGNOSIS — R079 Chest pain, unspecified: Secondary | ICD-10-CM | POA: Diagnosis not present

## 2021-03-04 DIAGNOSIS — R112 Nausea with vomiting, unspecified: Secondary | ICD-10-CM | POA: Diagnosis not present

## 2021-03-04 DIAGNOSIS — M5136 Other intervertebral disc degeneration, lumbar region: Secondary | ICD-10-CM | POA: Diagnosis not present

## 2021-03-04 DIAGNOSIS — R072 Precordial pain: Secondary | ICD-10-CM | POA: Diagnosis not present

## 2021-03-04 DIAGNOSIS — J029 Acute pharyngitis, unspecified: Secondary | ICD-10-CM | POA: Diagnosis not present

## 2021-03-04 DIAGNOSIS — G8929 Other chronic pain: Secondary | ICD-10-CM | POA: Diagnosis not present

## 2021-03-04 DIAGNOSIS — M542 Cervicalgia: Secondary | ICD-10-CM | POA: Diagnosis not present

## 2021-03-04 NOTE — Telephone Encounter (Addendum)
It was the lab for immature platelet fraction. Labs ordered and faxed

## 2021-03-04 NOTE — Telephone Encounter (Signed)
Hi Jennifer [name], I am not sure which 1 she is talking about I am assuming its that platelet 1 that she is talking about that we did fax over to them.  I am just not sure.  In regards to the vitamin B panel we can definitely do B1, B6, and B12.

## 2021-03-05 DIAGNOSIS — R059 Cough, unspecified: Secondary | ICD-10-CM | POA: Diagnosis not present

## 2021-03-05 DIAGNOSIS — J029 Acute pharyngitis, unspecified: Secondary | ICD-10-CM | POA: Diagnosis not present

## 2021-03-05 DIAGNOSIS — M542 Cervicalgia: Secondary | ICD-10-CM | POA: Diagnosis not present

## 2021-03-05 DIAGNOSIS — Z87891 Personal history of nicotine dependence: Secondary | ICD-10-CM | POA: Diagnosis not present

## 2021-03-05 DIAGNOSIS — R112 Nausea with vomiting, unspecified: Secondary | ICD-10-CM | POA: Diagnosis not present

## 2021-03-08 ENCOUNTER — Ambulatory Visit: Payer: Medicare HMO | Admitting: Family

## 2021-03-08 DIAGNOSIS — R209 Unspecified disturbances of skin sensation: Secondary | ICD-10-CM | POA: Diagnosis not present

## 2021-03-08 DIAGNOSIS — R112 Nausea with vomiting, unspecified: Secondary | ICD-10-CM | POA: Diagnosis not present

## 2021-03-08 DIAGNOSIS — R197 Diarrhea, unspecified: Secondary | ICD-10-CM | POA: Diagnosis not present

## 2021-03-08 DIAGNOSIS — R0602 Shortness of breath: Secondary | ICD-10-CM | POA: Diagnosis not present

## 2021-03-08 DIAGNOSIS — R9431 Abnormal electrocardiogram [ECG] [EKG]: Secondary | ICD-10-CM | POA: Diagnosis not present

## 2021-03-10 DIAGNOSIS — R000 Tachycardia, unspecified: Secondary | ICD-10-CM | POA: Diagnosis not present

## 2021-03-10 DIAGNOSIS — F419 Anxiety disorder, unspecified: Secondary | ICD-10-CM | POA: Diagnosis not present

## 2021-03-10 DIAGNOSIS — N644 Mastodynia: Secondary | ICD-10-CM | POA: Diagnosis not present

## 2021-03-10 DIAGNOSIS — R0602 Shortness of breath: Secondary | ICD-10-CM | POA: Diagnosis not present

## 2021-03-11 ENCOUNTER — Encounter: Payer: Self-pay | Admitting: Family Medicine

## 2021-03-11 ENCOUNTER — Ambulatory Visit (INDEPENDENT_AMBULATORY_CARE_PROVIDER_SITE_OTHER): Payer: Medicare HMO | Admitting: Family Medicine

## 2021-03-11 VITALS — BP 161/88 | HR 100 | Temp 98.6°F | Ht 63.0 in | Wt 223.7 lb

## 2021-03-11 DIAGNOSIS — N898 Other specified noninflammatory disorders of vagina: Secondary | ICD-10-CM | POA: Diagnosis not present

## 2021-03-11 DIAGNOSIS — D510 Vitamin B12 deficiency anemia due to intrinsic factor deficiency: Secondary | ICD-10-CM

## 2021-03-11 DIAGNOSIS — R0981 Nasal congestion: Secondary | ICD-10-CM | POA: Diagnosis not present

## 2021-03-11 DIAGNOSIS — J0191 Acute recurrent sinusitis, unspecified: Secondary | ICD-10-CM | POA: Diagnosis not present

## 2021-03-11 DIAGNOSIS — L659 Nonscarring hair loss, unspecified: Secondary | ICD-10-CM

## 2021-03-11 DIAGNOSIS — K5909 Other constipation: Secondary | ICD-10-CM | POA: Diagnosis not present

## 2021-03-11 DIAGNOSIS — C519 Malignant neoplasm of vulva, unspecified: Secondary | ICD-10-CM

## 2021-03-11 LAB — WET PREP FOR TRICH, YEAST, CLUE
MICRO NUMBER:: 12179712
Specimen Quality: ADEQUATE

## 2021-03-11 MED ORDER — DOXYCYCLINE HYCLATE 50 MG PO CAPS: 50.0000 mg | ORAL_CAPSULE | Freq: Two times a day (BID) | ORAL | 0 refills | Status: DC

## 2021-03-11 MED ORDER — CYANOCOBALAMIN 1000 MCG/ML IJ SOLN
1000.0000 ug | Freq: Once | INTRAMUSCULAR | Status: AC
  Administered 2021-03-11: 1000 ug via INTRAMUSCULAR
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
## 2021-03-13 ENCOUNTER — Emergency Department (HOSPITAL_BASED_OUTPATIENT_CLINIC_OR_DEPARTMENT_OTHER): Payer: Medicare HMO

## 2021-03-13 ENCOUNTER — Emergency Department (HOSPITAL_BASED_OUTPATIENT_CLINIC_OR_DEPARTMENT_OTHER)
Admission: EM | Admit: 2021-03-13 | Discharge: 2021-03-14 | Disposition: A | Discharge status: Home / Self Care | Payer: Medicare HMO | Attending: Emergency Medicine | Admitting: Emergency Medicine

## 2021-03-13 ENCOUNTER — Other Ambulatory Visit: Payer: Self-pay

## 2021-03-13 ENCOUNTER — Encounter (HOSPITAL_BASED_OUTPATIENT_CLINIC_OR_DEPARTMENT_OTHER): Payer: Self-pay | Admitting: *Deleted

## 2021-03-13 DIAGNOSIS — J45909 Unspecified asthma, uncomplicated: Secondary | ICD-10-CM | POA: Diagnosis not present

## 2021-03-13 DIAGNOSIS — I10 Essential (primary) hypertension: Secondary | ICD-10-CM | POA: Diagnosis not present

## 2021-03-13 DIAGNOSIS — S2231XA Fracture of one rib, right side, initial encounter for closed fracture: Secondary | ICD-10-CM | POA: Diagnosis not present

## 2021-03-13 DIAGNOSIS — Z8616 Personal history of COVID-19: Secondary | ICD-10-CM | POA: Insufficient documentation

## 2021-03-13 DIAGNOSIS — Z79899 Other long term (current) drug therapy: Secondary | ICD-10-CM | POA: Diagnosis not present

## 2021-03-13 DIAGNOSIS — R0602 Shortness of breath: Secondary | ICD-10-CM | POA: Diagnosis not present

## 2021-03-13 DIAGNOSIS — Z86011 Personal history of benign neoplasm of the brain: Secondary | ICD-10-CM | POA: Insufficient documentation

## 2021-03-13 DIAGNOSIS — Z8542 Personal history of malignant neoplasm of other parts of uterus: Secondary | ICD-10-CM | POA: Diagnosis not present

## 2021-03-13 DIAGNOSIS — R002 Palpitations: Secondary | ICD-10-CM

## 2021-03-13 DIAGNOSIS — R42 Dizziness and giddiness: Secondary | ICD-10-CM | POA: Diagnosis not present

## 2021-03-13 DIAGNOSIS — Z87891 Personal history of nicotine dependence: Secondary | ICD-10-CM | POA: Diagnosis not present

## 2021-03-13 NOTE — ED Triage Notes (Signed)
Pt reports palpitations "heart flopping" since Friday. States she is wearing a loop recorder. States she feels like she may be dehydrated

## 2021-03-14 DIAGNOSIS — S2231XA Fracture of one rib, right side, initial encounter for closed fracture: Secondary | ICD-10-CM | POA: Diagnosis not present

## 2021-03-14 DIAGNOSIS — R002 Palpitations: Secondary | ICD-10-CM | POA: Diagnosis not present

## 2021-03-14 DIAGNOSIS — D510 Vitamin B12 deficiency anemia due to intrinsic factor deficiency: Secondary | ICD-10-CM | POA: Insufficient documentation

## 2021-03-14 LAB — CBC WITH DIFFERENTIAL/PLATELET
Abs Immature Granulocytes: 0.04 10*3/uL (ref 0.00–0.07)
Basophils Absolute: 0.1 10*3/uL (ref 0.0–0.1)
Basophils Relative: 1 %
Eosinophils Absolute: 0.2 10*3/uL (ref 0.0–0.5)
Eosinophils Relative: 3 %
HCT: 39.6 % (ref 36.0–46.0)
Hemoglobin: 13.2 g/dL (ref 12.0–15.0)
Immature Granulocytes: 1 %
Lymphocytes Relative: 20 %
Lymphs Abs: 1.7 10*3/uL (ref 0.7–4.0)
MCH: 30.3 pg (ref 26.0–34.0)
MCHC: 33.3 g/dL (ref 30.0–36.0)
MCV: 90.8 fL (ref 80.0–100.0)
Monocytes Absolute: 0.8 10*3/uL (ref 0.1–1.0)
Monocytes Relative: 9 %
Neutro Abs: 5.5 10*3/uL (ref 1.7–7.7)
Neutrophils Relative %: 66 %
Platelets: 188 10*3/uL (ref 150–400)
RBC: 4.36 MIL/uL (ref 3.87–5.11)
RDW: 13.5 % (ref 11.5–15.5)
WBC: 8.3 10*3/uL (ref 4.0–10.5)
nRBC: 0 % (ref 0.0–0.2)

## 2021-03-14 LAB — BASIC METABOLIC PANEL
Anion gap: 7 (ref 5–15)
BUN: 19 mg/dL (ref 6–20)
CO2: 27 mmol/L (ref 22–32)
Calcium: 8.9 mg/dL (ref 8.9–10.3)
Chloride: 108 mmol/L (ref 98–111)
Creatinine, Ser: 0.66 mg/dL (ref 0.44–1.00)
GFR, Estimated: >60 mL/min (ref >60)
Glucose, Bld: 111 mg/dL — ABNORMAL HIGH (ref 70–99)
Potassium: 3.5 mmol/L (ref 3.5–5.1)
Sodium: 142 mmol/L (ref 135–145)

## 2021-03-14 LAB — TROPONIN I (HIGH SENSITIVITY): Troponin I (High Sensitivity): 3 ng/L (ref <18)

## 2021-03-14 MED ORDER — LACTULOSE 10 GM/15ML PO SOLN: 30.0000 g | Freq: Two times a day (BID) | ORAL | 2 refills | Status: DC | PRN

## 2021-03-14 MED ORDER — SODIUM CHLORIDE 0.9 % IV BOLUS
500.0000 mL | Freq: Once | INTRAVENOUS | Status: AC
  Administered 2021-03-14: 500 mL via INTRAVENOUS

## 2021-03-14 NOTE — Assessment & Plan Note (Signed)
She would like to try the lactulose again.  We will send over new prescription she can always use the mag citrate if needed if she still not moving her bowels well.

## 2021-03-14 NOTE — Assessment & Plan Note (Signed)
She does have an appointment scheduled with Dr. [name] on August 4 just encouraged her to keep that appointment.

## 2021-03-14 NOTE — Assessment & Plan Note (Signed)
We will treat with doxycycline since she is still symptomatic.  Though she wants to take the 50 mg tabs instead of the 100 mg dose.
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
## 2021-03-14 NOTE — Assessment & Plan Note (Signed)
We discussed the option of starting oral B12 versus B12 injections.  Recent labs indicated that her B12 was low she was okay with starting the injections first 1 given today we will plan to repeat again in 2 weeks at her follow-up.

## 2021-03-14 NOTE — Assessment & Plan Note (Signed)
Could be due to low iron and/or B12 deficiency.

## 2021-03-15 DIAGNOSIS — I10 Essential (primary) hypertension: Secondary | ICD-10-CM | POA: Diagnosis not present

## 2021-03-15 DIAGNOSIS — E876 Hypokalemia: Secondary | ICD-10-CM | POA: Diagnosis not present

## 2021-03-15 DIAGNOSIS — R519 Headache, unspecified: Secondary | ICD-10-CM | POA: Diagnosis not present

## 2021-03-15 DIAGNOSIS — R0902 Hypoxemia: Secondary | ICD-10-CM | POA: Diagnosis not present

## 2021-03-15 DIAGNOSIS — R42 Dizziness and giddiness: Secondary | ICD-10-CM | POA: Diagnosis not present

## 2021-03-15 DIAGNOSIS — R002 Palpitations: Secondary | ICD-10-CM | POA: Diagnosis not present

## 2021-03-15 DIAGNOSIS — R079 Chest pain, unspecified: Secondary | ICD-10-CM | POA: Diagnosis not present

## 2021-03-15 DIAGNOSIS — R0602 Shortness of breath: Secondary | ICD-10-CM | POA: Diagnosis not present

## 2021-03-17 DIAGNOSIS — N200 Calculus of kidney: Secondary | ICD-10-CM | POA: Diagnosis not present

## 2021-03-17 DIAGNOSIS — G4730 Sleep apnea, unspecified: Secondary | ICD-10-CM | POA: Diagnosis not present

## 2021-03-17 DIAGNOSIS — G35 Multiple sclerosis: Secondary | ICD-10-CM | POA: Diagnosis not present

## 2021-03-17 DIAGNOSIS — E1169 Type 2 diabetes mellitus with other specified complication: Secondary | ICD-10-CM | POA: Diagnosis not present

## 2021-03-17 DIAGNOSIS — E1165 Type 2 diabetes mellitus with hyperglycemia: Secondary | ICD-10-CM | POA: Diagnosis not present

## 2021-03-17 DIAGNOSIS — Z90710 Acquired absence of both cervix and uterus: Secondary | ICD-10-CM | POA: Diagnosis not present

## 2021-03-17 DIAGNOSIS — E2689 Other hyperaldosteronism: Secondary | ICD-10-CM | POA: Diagnosis not present

## 2021-03-17 DIAGNOSIS — Z8542 Personal history of malignant neoplasm of other parts of uterus: Secondary | ICD-10-CM | POA: Diagnosis not present

## 2021-03-17 DIAGNOSIS — C4499 Other specified malignant neoplasm of skin, unspecified: Secondary | ICD-10-CM | POA: Diagnosis not present

## 2021-03-17 DIAGNOSIS — K5900 Constipation, unspecified: Secondary | ICD-10-CM | POA: Diagnosis not present

## 2021-03-17 DIAGNOSIS — I10 Essential (primary) hypertension: Secondary | ICD-10-CM | POA: Diagnosis not present

## 2021-03-17 DIAGNOSIS — R1084 Generalized abdominal pain: Secondary | ICD-10-CM | POA: Diagnosis not present

## 2021-03-17 DIAGNOSIS — K429 Umbilical hernia without obstruction or gangrene: Secondary | ICD-10-CM | POA: Diagnosis not present

## 2021-03-17 DIAGNOSIS — E785 Hyperlipidemia, unspecified: Secondary | ICD-10-CM | POA: Diagnosis not present

## 2021-03-17 DIAGNOSIS — N281 Cyst of kidney, acquired: Secondary | ICD-10-CM | POA: Diagnosis not present

## 2021-03-17 DIAGNOSIS — K5790 Diverticulosis of intestine, part unspecified, without perforation or abscess without bleeding: Secondary | ICD-10-CM | POA: Diagnosis not present

## 2021-03-18 ENCOUNTER — Encounter: Payer: Self-pay | Admitting: Family Medicine

## 2021-03-18 ENCOUNTER — Ambulatory Visit (INDEPENDENT_AMBULATORY_CARE_PROVIDER_SITE_OTHER): Payer: Medicare HMO | Admitting: Family Medicine

## 2021-03-18 VITALS — BP 148/83 | HR 99 | Ht 63.0 in | Wt 222.2 lb

## 2021-03-18 DIAGNOSIS — R252 Cramp and spasm: Secondary | ICD-10-CM | POA: Diagnosis not present

## 2021-03-18 DIAGNOSIS — D510 Vitamin B12 deficiency anemia due to intrinsic factor deficiency: Secondary | ICD-10-CM

## 2021-03-18 DIAGNOSIS — K760 Fatty (change of) liver, not elsewhere classified: Secondary | ICD-10-CM

## 2021-03-18 DIAGNOSIS — R0789 Other chest pain: Secondary | ICD-10-CM | POA: Diagnosis not present

## 2021-03-18 DIAGNOSIS — E876 Hypokalemia: Secondary | ICD-10-CM | POA: Diagnosis not present

## 2021-03-18 DIAGNOSIS — I10 Essential (primary) hypertension: Secondary | ICD-10-CM | POA: Diagnosis not present

## 2021-03-18 DIAGNOSIS — M545 Low back pain, unspecified: Secondary | ICD-10-CM | POA: Diagnosis not present

## 2021-03-18 MED ORDER — CYANOCOBALAMIN 1000 MCG/ML IJ SOLN
1000.0000 ug | Freq: Once | INTRAMUSCULAR | Status: AC
  Administered 2021-03-18: 1000 ug via INTRAMUSCULAR

## 2021-03-18 MED ORDER — POTASSIUM CHLORIDE 20 MEQ/15ML (10%) PO SOLN: 20.0000 meq | Freq: Every day | ORAL | 2 refills | Status: DC

## 2021-03-18 NOTE — Assessment & Plan Note (Signed)
BMI greater than 35 with multiple comorbidities.  Discussed setting calorie goals by using one of the smart phone apps like my fitness pal or lose it to again set some reasonable goals since she is limited in her ability to exercise currently.

## 2021-03-18 NOTE — Assessment & Plan Note (Addendum)
b12 injection today.  Will receive third repeat injection in 2 weeks.

## 2021-03-18 NOTE — Assessment & Plan Note (Signed)
Will monitor liver enzymes Q 6 months.
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
## 2021-03-18 NOTE — Assessment & Plan Note (Signed)
Change potassium dosing.  She really uses it as needed.

## 2021-03-21 DIAGNOSIS — R0789 Other chest pain: Secondary | ICD-10-CM | POA: Diagnosis not present

## 2021-03-21 DIAGNOSIS — K5790 Diverticulosis of intestine, part unspecified, without perforation or abscess without bleeding: Secondary | ICD-10-CM | POA: Diagnosis not present

## 2021-03-21 DIAGNOSIS — R1084 Generalized abdominal pain: Secondary | ICD-10-CM | POA: Diagnosis not present

## 2021-03-21 DIAGNOSIS — I700 Atherosclerosis of aorta: Secondary | ICD-10-CM | POA: Diagnosis not present

## 2021-03-21 DIAGNOSIS — K5730 Diverticulosis of large intestine without perforation or abscess without bleeding: Secondary | ICD-10-CM | POA: Diagnosis not present

## 2021-03-21 DIAGNOSIS — N281 Cyst of kidney, acquired: Secondary | ICD-10-CM | POA: Diagnosis not present

## 2021-03-21 DIAGNOSIS — K429 Umbilical hernia without obstruction or gangrene: Secondary | ICD-10-CM | POA: Diagnosis not present

## 2021-03-22 DIAGNOSIS — K5730 Diverticulosis of large intestine without perforation or abscess without bleeding: Secondary | ICD-10-CM | POA: Diagnosis not present

## 2021-03-22 DIAGNOSIS — R079 Chest pain, unspecified: Secondary | ICD-10-CM | POA: Diagnosis not present

## 2021-03-22 DIAGNOSIS — K429 Umbilical hernia without obstruction or gangrene: Secondary | ICD-10-CM | POA: Diagnosis not present

## 2021-03-22 DIAGNOSIS — I700 Atherosclerosis of aorta: Secondary | ICD-10-CM | POA: Diagnosis not present

## 2021-03-22 DIAGNOSIS — N281 Cyst of kidney, acquired: Secondary | ICD-10-CM | POA: Diagnosis not present

## 2021-03-24 ENCOUNTER — Telehealth: Payer: Self-pay | Admitting: Family Medicine

## 2021-03-24 DIAGNOSIS — R0602 Shortness of breath: Secondary | ICD-10-CM | POA: Diagnosis not present

## 2021-03-24 LAB — BASIC METABOLIC PANEL
BUN: 25 — AB (ref 4–21)
CO2: 110 — AB (ref 13–22)
Creatinine: 0.7 (ref 0.5–1.1)
Glucose: 92
Potassium: 3.9 (ref 3.4–5.3)
Sodium: 142 (ref 137–147)

## 2021-03-24 LAB — COMPREHENSIVE METABOLIC PANEL
Calcium: 8.9 (ref 8.7–10.7)
GFR calc non Af Amer: >90

## 2021-03-24 NOTE — Chronic Care Management (AMB) (Signed)
  Care Management   Follow Up Note   03/24/2021 Name: Jennifer [name] MRN: [id number] DOB: [birth year]   Referred by: [name], MD Reason for referral : No chief complaint on file.   Successful contact was made with the patient to discuss care management and care coordination services. Patient declines engagement at this time.   Follow Up Plan: No further follow up required: 03/24/21  [place]

## 2021-03-25 ENCOUNTER — Encounter: Payer: Self-pay | Admitting: Family Medicine

## 2021-03-27 DIAGNOSIS — J4520 Mild intermittent asthma, uncomplicated: Secondary | ICD-10-CM | POA: Diagnosis not present

## 2021-03-28 DIAGNOSIS — M7989 Other specified soft tissue disorders: Secondary | ICD-10-CM | POA: Diagnosis not present

## 2021-03-28 DIAGNOSIS — I10 Essential (primary) hypertension: Secondary | ICD-10-CM | POA: Diagnosis not present

## 2021-03-28 DIAGNOSIS — G4733 Obstructive sleep apnea (adult) (pediatric): Secondary | ICD-10-CM | POA: Diagnosis not present

## 2021-03-28 DIAGNOSIS — M25532 Pain in left wrist: Secondary | ICD-10-CM | POA: Diagnosis not present

## 2021-03-28 DIAGNOSIS — I471 Supraventricular tachycardia: Secondary | ICD-10-CM | POA: Diagnosis not present

## 2021-03-28 DIAGNOSIS — S6992XA Unspecified injury of left wrist, hand and finger(s), initial encounter: Secondary | ICD-10-CM | POA: Diagnosis not present

## 2021-03-28 DIAGNOSIS — M79642 Pain in left hand: Secondary | ICD-10-CM | POA: Diagnosis not present

## 2021-03-28 DIAGNOSIS — S59912A Unspecified injury of left forearm, initial encounter: Secondary | ICD-10-CM | POA: Diagnosis not present

## 2021-03-28 DIAGNOSIS — E611 Iron deficiency: Secondary | ICD-10-CM | POA: Diagnosis not present

## 2021-03-28 DIAGNOSIS — W1839XA Other fall on same level, initial encounter: Secondary | ICD-10-CM | POA: Diagnosis not present

## 2021-03-28 DIAGNOSIS — D1722 Benign lipomatous neoplasm of skin and subcutaneous tissue of left arm: Secondary | ICD-10-CM | POA: Diagnosis not present

## 2021-03-28 DIAGNOSIS — Z95818 Presence of other cardiac implants and grafts: Secondary | ICD-10-CM | POA: Diagnosis not present

## 2021-03-28 DIAGNOSIS — I700 Atherosclerosis of aorta: Secondary | ICD-10-CM | POA: Diagnosis not present

## 2021-03-28 DIAGNOSIS — Y999 Unspecified external cause status: Secondary | ICD-10-CM | POA: Diagnosis not present

## 2021-03-31 ENCOUNTER — Emergency Department (HOSPITAL_BASED_OUTPATIENT_CLINIC_OR_DEPARTMENT_OTHER)
Admission: EM | Admit: 2021-03-31 | Discharge: 2021-03-31 | Disposition: A | Discharge status: Home / Self Care | Payer: Medicare HMO | Attending: Emergency Medicine | Admitting: Emergency Medicine

## 2021-03-31 ENCOUNTER — Encounter (HOSPITAL_BASED_OUTPATIENT_CLINIC_OR_DEPARTMENT_OTHER): Payer: Self-pay | Admitting: *Deleted

## 2021-03-31 ENCOUNTER — Other Ambulatory Visit: Payer: Self-pay

## 2021-03-31 DIAGNOSIS — Z87891 Personal history of nicotine dependence: Secondary | ICD-10-CM | POA: Diagnosis not present

## 2021-03-31 DIAGNOSIS — R0789 Other chest pain: Secondary | ICD-10-CM | POA: Diagnosis not present

## 2021-03-31 DIAGNOSIS — Z8542 Personal history of malignant neoplasm of other parts of uterus: Secondary | ICD-10-CM | POA: Insufficient documentation

## 2021-03-31 DIAGNOSIS — Z79899 Other long term (current) drug therapy: Secondary | ICD-10-CM | POA: Diagnosis not present

## 2021-03-31 DIAGNOSIS — R1010 Upper abdominal pain, unspecified: Secondary | ICD-10-CM | POA: Diagnosis not present

## 2021-03-31 DIAGNOSIS — J45909 Unspecified asthma, uncomplicated: Secondary | ICD-10-CM | POA: Diagnosis not present

## 2021-03-31 DIAGNOSIS — Z86011 Personal history of benign neoplasm of the brain: Secondary | ICD-10-CM | POA: Diagnosis not present

## 2021-03-31 DIAGNOSIS — K219 Gastro-esophageal reflux disease without esophagitis: Secondary | ICD-10-CM | POA: Insufficient documentation

## 2021-03-31 DIAGNOSIS — R070 Pain in throat: Secondary | ICD-10-CM | POA: Insufficient documentation

## 2021-03-31 DIAGNOSIS — I10 Essential (primary) hypertension: Secondary | ICD-10-CM | POA: Diagnosis not present

## 2021-03-31 DIAGNOSIS — R0989 Other specified symptoms and signs involving the circulatory and respiratory systems: Secondary | ICD-10-CM | POA: Diagnosis not present

## 2021-03-31 DIAGNOSIS — R1013 Epigastric pain: Secondary | ICD-10-CM | POA: Insufficient documentation

## 2021-03-31 DIAGNOSIS — Z8616 Personal history of COVID-19: Secondary | ICD-10-CM | POA: Insufficient documentation

## 2021-03-31 DIAGNOSIS — R198 Other specified symptoms and signs involving the digestive system and abdomen: Secondary | ICD-10-CM

## 2021-03-31 LAB — CBC WITH DIFFERENTIAL/PLATELET
Abs Immature Granulocytes: 0.03 10*3/uL (ref 0.00–0.07)
Basophils Absolute: 0.1 10*3/uL (ref 0.0–0.1)
Basophils Relative: 1 %
Eosinophils Absolute: 0.2 10*3/uL (ref 0.0–0.5)
Eosinophils Relative: 3 %
HCT: 44 % (ref 36.0–46.0)
Hemoglobin: 14.5 g/dL (ref 12.0–15.0)
Immature Granulocytes: 0 %
Lymphocytes Relative: 23 %
Lymphs Abs: 1.6 10*3/uL (ref 0.7–4.0)
MCH: 29.8 pg (ref 26.0–34.0)
MCHC: 33 g/dL (ref 30.0–36.0)
MCV: 90.3 fL (ref 80.0–100.0)
Monocytes Absolute: 0.5 10*3/uL (ref 0.1–1.0)
Monocytes Relative: 7 %
Neutro Abs: 4.4 10*3/uL (ref 1.7–7.7)
Neutrophils Relative %: 66 %
Platelets: 207 10*3/uL (ref 150–400)
RBC: 4.87 MIL/uL (ref 3.87–5.11)
RDW: 13.5 % (ref 11.5–15.5)
WBC: 6.7 10*3/uL (ref 4.0–10.5)
nRBC: 0 % (ref 0.0–0.2)

## 2021-03-31 LAB — COMPREHENSIVE METABOLIC PANEL
ALT: 22 U/L (ref 0–44)
AST: 17 U/L (ref 15–41)
Albumin: 4.2 g/dL (ref 3.5–5.0)
Alkaline Phosphatase: 70 U/L (ref 38–126)
Anion gap: 9 (ref 5–15)
BUN: 14 mg/dL (ref 6–20)
CO2: 24 mmol/L (ref 22–32)
Calcium: 9.1 mg/dL (ref 8.9–10.3)
Chloride: 107 mmol/L (ref 98–111)
Creatinine, Ser: 0.52 mg/dL (ref 0.44–1.00)
GFR, Estimated: >60 mL/min (ref >60)
Glucose, Bld: 103 mg/dL — ABNORMAL HIGH (ref 70–99)
Potassium: 3.7 mmol/L (ref 3.5–5.1)
Sodium: 140 mmol/L (ref 135–145)
Total Bilirubin: 0.4 mg/dL (ref 0.3–1.2)
Total Protein: 7.1 g/dL (ref 6.5–8.1)

## 2021-03-31 LAB — LIPASE, BLOOD: Lipase: 39 U/L (ref 11–51)

## 2021-03-31 MED ORDER — ALUM & MAG HYDROXIDE-SIMETH 200-200-20 MG/5ML PO SUSP
15.0000 mL | Freq: Once | ORAL | Status: AC
  Administered 2021-03-31: 15 mL via ORAL
  Filled 2021-03-31: qty 30

## 2021-03-31 MED ORDER — DIPHENHYDRAMINE HCL 25 MG PO CAPS
25.0000 mg | ORAL_CAPSULE | Freq: Once | ORAL | Status: DC
  Filled 2021-03-31: qty 1

## 2021-03-31 NOTE — ED Notes (Addendum)
Pt states mid chest pain that started yesterday, states dry cough with h/o asthma. Denies any fever.  States some nausea as well. Currently wearing loop monitor
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
## 2021-03-31 NOTE — ED Notes (Signed)
ED Provider at bedside. 

## 2021-03-31 NOTE — Discharge Instructions (Addendum)
Continue taking home medications as prescribed. Follow-up with your GI doctor as scheduled at your appointment tomorrow. Use Maalox or other similar equivalents as needed for stomach discomfort or burning sensation. I recommend you take a Benadryl when you get home to help with your throat irritation. Return to the emergency room with any new, worsening, concerning symptoms.

## 2021-03-31 NOTE — ED Triage Notes (Addendum)
Pressure in the center of her chest since yesterday. Her throat feels swollen. EKG done at triage. She feels like is allergic to a dog or cat.

## 2021-04-01 ENCOUNTER — Telehealth: Payer: Self-pay | Admitting: Physician Assistant

## 2021-04-01 ENCOUNTER — Other Ambulatory Visit: Payer: Self-pay | Admitting: Physician Assistant

## 2021-04-01 ENCOUNTER — Telehealth: Payer: Self-pay

## 2021-04-01 ENCOUNTER — Ambulatory Visit (INDEPENDENT_AMBULATORY_CARE_PROVIDER_SITE_OTHER): Payer: Medicare HMO | Admitting: Family Medicine

## 2021-04-01 ENCOUNTER — Encounter: Payer: Self-pay | Admitting: Family Medicine

## 2021-04-01 VITALS — BP 160/88 | HR 88 | Ht 63.0 in | Wt 223.3 lb

## 2021-04-01 DIAGNOSIS — R0789 Other chest pain: Secondary | ICD-10-CM

## 2021-04-01 DIAGNOSIS — R209 Unspecified disturbances of skin sensation: Secondary | ICD-10-CM | POA: Diagnosis not present

## 2021-04-01 DIAGNOSIS — R5383 Other fatigue: Secondary | ICD-10-CM

## 2021-04-01 DIAGNOSIS — I10 Essential (primary) hypertension: Secondary | ICD-10-CM

## 2021-04-01 DIAGNOSIS — R7301 Impaired fasting glucose: Secondary | ICD-10-CM

## 2021-04-01 DIAGNOSIS — E539 Vitamin B deficiency, unspecified: Secondary | ICD-10-CM

## 2021-04-01 DIAGNOSIS — K5909 Other constipation: Secondary | ICD-10-CM | POA: Diagnosis not present

## 2021-04-01 DIAGNOSIS — D510 Vitamin B12 deficiency anemia due to intrinsic factor deficiency: Secondary | ICD-10-CM | POA: Diagnosis not present

## 2021-04-01 DIAGNOSIS — R159 Full incontinence of feces: Secondary | ICD-10-CM | POA: Diagnosis not present

## 2021-04-01 DIAGNOSIS — L290 Pruritus ani: Secondary | ICD-10-CM | POA: Diagnosis not present

## 2021-04-01 LAB — POCT GLYCOSYLATED HEMOGLOBIN (HGB A1C): Hemoglobin A1C: 5.8 % — AB (ref 4.0–5.6)

## 2021-04-01 MED ORDER — HYDRALAZINE HCL 10 MG PO TABS: 10.0000 mg | ORAL_TABLET | Freq: Three times a day (TID) | ORAL | 0 refills | Status: DC

## 2021-04-01 MED ORDER — CYANOCOBALAMIN 1000 MCG/ML IJ SOLN
1000.0000 ug | Freq: Once | INTRAMUSCULAR | Status: AC
  Administered 2021-04-01: 1000 ug via INTRAMUSCULAR

## 2021-04-01 NOTE — Progress Notes (Signed)
Established Patient Office Visit  Subjective:  Patient ID: Jennifer [name], female    DOB: 04/20/1966  Age: 55 y.o. MRN: [id number]  CC:  Chief Complaint  Patient presents with   Hospitalization Follow-up    HPI Jennifer [name] presents for follow-up.   She is concerned that she may be getting a more intense allergic reaction to dog she has been spending more time at her neighbors house who has a dog and notices that she has more chest tightness and sometimes a hard time breathing when she is there.  She has not tried to use her inhaler.  She also reports that she has been getting up more mucus when she coughs.  For example last night she started laughing which then triggered an intense coughing fit to the point that she ended up coughing up significant amounts of mucus and a small amount of blood.  She is still concerned about her weight gain, fatigue, and hair loss.  She would like to have the thyroid checked.  Hypertension-she would like a new prescription for the hydralazine to try again.  She is still taking her metoprolol.  Reports still feeling very fatigued.  She is starting to get pain in her feet again couple years ago she had had a significant episode of severe bilateral foot pain it eventually seemed to resolve on its own.  She has been getting a burning sensation in her chest as well.  Impaired fasting glucose-no increased thirst or urination. No symptoms consistent with hypoglycemia.   Outpatient Medications Prior to Visit  Medication Sig Dispense Refill   AMBULATORY NON FORMULARY MEDICATION Medication Name: Wheelchair.  Dx. Multiple sclerosis, lower extremity weakness. 1 Units 0   doxycycline (VIBRAMYCIN) 50 MG capsule Take 1 capsule (50 mg total) by mouth 2 (two) times daily. 20 capsule 0   fluticasone (FLONASE) 50 MCG/ACT nasal spray Place 2 sprays into both nostrils daily.     levalbuterol (XOPENEX HFA) 45 MCG/ACT inhaler INHALE 2 PUFFS INTO THE LUNGS EVERY 6  HOURS AS NEEDED FOR WHEEZING (Patient taking differently: Inhale 1 puff into the lungs daily as needed for wheezing or shortness of breath.) 45 g 0   levalbuterol (XOPENEX) 1.25 MG/3ML nebulizer solution Take 1.25 mg by nebulization every 3 (three) hours as needed for wheezing. 72 mL 1   metoprolol tartrate (LOPRESSOR) 25 MG tablet Take 1 tablet (25 mg total) by mouth in the morning, at noon, and at bedtime. 90 tablet 0   MINOXIDIL, TOPICAL, 5 % SOLN Apply 1 application topically in the morning and at bedtime. 60 mL 2   potassium chloride 20 MEQ/15ML (10%) SOLN Take 15 mLs (20 mEq total) by mouth daily. 473 mL 2   lactulose (CHRONULAC) 10 GM/15ML solution Take 45 mLs (30 g total) by mouth 2 (two) times daily as needed for mild constipation. 236 mL 2   rosuvastatin (CRESTOR) 5 MG tablet TAKE 1 TABLET(5 MG) BY MOUTH 2 TIMES A WEEK 24 tablet 0   No facility-administered medications prior to visit.    Allergies  Allergen Reactions   Azithromycin Shortness Of Breath    Lip swelling, SOB.      Ciprofloxacin Swelling    REACTION: tongue swells   Codeine Shortness Of Breath   Erythromycin Base Itching and Rash   Sulfa Antibiotics Shortness Of Breath, Rash and Other (See Comments)   Sulfasalazine Rash and Shortness Of Breath    Other reaction(s): Other (See Comments) Other reaction(s): SHORTNESS OF BREATH  Telmisartan Swelling    Tongue swelling, Micardis   Ace Inhibitors Cough   Aspirin Hives and Other (See Comments)    flushing   Atenolol Other (See Comments)    Squeezing chest sensation   Avelox [Moxifloxacin Hcl In Nacl] Itching        Beta Adrenergic Blockers Other (See Comments)    Feels like chest tightening labetalol, bystolic  Feels like chest tightening "Metoprolol"    Buspar [Buspirone] Other (See Comments)    Light headed   Butorphanol Tartrate Other (See Comments)    Patient aggitated   Cetirizine Hives and Rash        Clonidine Hcl     REACTION: makes blood pressure  high   Cortisone     Feels like she is going crazy   Erythromycin Rash   Fentanyl Other (See Comments)    aggressive    Fluoxetine Hcl Other (See Comments)    REACTION: headaches   Ketorolac Tromethamine     jittery   Lidocaine Other (See Comments)    When it involves the throat,    Lisinopril Cough   Metoclopramide Hcl Other (See Comments)    Dystonic reaction   Midazolam Other (See Comments)    agitation Slow to wake up   Montelukast Other (See Comments)    Singulair   Montelukast Sodium Other (See Comments)    DOES NOT REMEMBER  Don't remember-told not to take   Naproxen Other (See Comments)    FLUSHING Pt states she took Ibuprofen today (10/08/19)   Paroxetine Other (See Comments)    REACTION: headaches   Penicillins Rash   Pravastatin Other (See Comments)    Myalgias   Promethazine Other (See Comments)    Dystonic reaction   Promethazine Hcl Other (See Comments)    jittery   Quinolones Swelling and Rash   Serotonin Reuptake Inhibitors (Ssris) Other (See Comments)    Headache Effexor, prozac, zoloft,    Sertraline Hcl     REACTION: headaches   Stelazine [Trifluoperazine] Other (See Comments)    Dystonic reaction   Tobramycin Itching and Rash   Trifluoperazine Hcl     dystonic   Atrovent Nasal Spray [Ipratropium]     Tachycardia and shaking   Diltiazem Other (See Comments)    Chest pain   Polyethylene Glycol 3350     Other reaction(s): Laryngeal Edema (ALLERGY)   Propoxyphene    Adhesive [Tape] Rash    EKG monitor patches, some tapes Blisters, rash, itching, welts.   Butorphanol Anxiety    Patient agitated   Ceftriaxone Rash    rocephin   Iron Rash    Flushing with certain IV types   Metoclopramide Itching and Other (See Comments)    Dystonic reaction   Metronidazole Rash   Other Rash and Other (See Comments)    Uncoded Allergy. Allergen: steriods, Other Reaction: Not Assessed Other reaction(s): Flushing (ALLERGY/intolerance), GI Upset  (intolerance), Hypertension (intolerance), Increased Heart Rate (intolerance), Mental Status Changes (intolerance), Other (See Comments), Tachycardia / Palpitations  (intolerance) Hospital gowns leave a rash.    Prednisone Anxiety and Palpitations   Prochlorperazine Anxiety    Compazine:  Dystonic reaction   Venlafaxine Anxiety   Zyrtec [Cetirizine Hcl] Rash    All over body    ROS Review of Systems    Objective:    Physical Exam Constitutional:      Appearance: Normal appearance. She is well-developed.  HENT:     Head: Normocephalic and atraumatic.  Cardiovascular:  Rate and Rhythm: Normal rate and regular rhythm.     Heart sounds: Normal heart sounds.  Pulmonary:     Effort: Pulmonary effort is normal.     Breath sounds: Normal breath sounds.  Skin:    General: Skin is warm and dry.  Neurological:     Mental Status: She is alert and oriented to person, place, and time.  Psychiatric:        Behavior: Behavior normal.    BP (!) 160/88 (BP Location: Left Arm, Patient Position: Sitting, Cuff Size: Large)   Pulse 88   Ht '5\' 3"'$  (1.6 m)   Wt 223 lb 4.8 oz (101.3 kg)   LMP 06/25/2013   SpO2 97%   BMI 39.56 kg/m  Wt Readings from Last 3 Encounters:  04/01/21 223 lb 4.8 oz (101.3 kg)  03/31/21 220 lb (99.8 kg)  03/18/21 222 lb 3.2 oz (100.8 kg)     Health Maintenance Due  Topic Date Due   Pneumococcal Vaccine 60-86 Years old (1 - PCV) Never done   Zoster Vaccines- Shingrix (1 of 2) Never done   COVID-19 Vaccine (4 - Booster) 08/01/2020   INFLUENZA VACCINE  03/14/2021    There are no preventive care reminders to display for this patient.  Lab Results  Component Value Date   TSH 2.11 10/19/2015   Lab Results  Component Value Date   WBC 6.7 03/31/2021   HGB 14.5 03/31/2021   HCT 44.0 03/31/2021   MCV 90.3 03/31/2021   PLT 207 03/31/2021   Lab Results  Component Value Date   NA 140 03/31/2021   K 3.7 03/31/2021   CO2 24 03/31/2021   GLUCOSE 103 (H)  03/31/2021   BUN 14 03/31/2021   CREATININE 0.52 03/31/2021   BILITOT 0.4 03/31/2021   ALKPHOS 70 03/31/2021   AST 17 03/31/2021   ALT 22 03/31/2021   PROT 7.1 03/31/2021   ALBUMIN 4.2 03/31/2021   CALCIUM 9.1 03/31/2021   ANIONGAP 9 03/31/2021   Lab Results  Component Value Date   CHOL 170 01/24/2018   Lab Results  Component Value Date   HDL 38 01/24/2018   Lab Results  Component Value Date   LDLCALC 92 01/24/2018   Lab Results  Component Value Date   TRIG 165 (H) 07/30/2020   Lab Results  Component Value Date   CHOLHDL 4.6 11/20/2013   Lab Results  Component Value Date   HGBA1C 5.8 (A) 04/01/2021      Assessment & Plan:   Problem List Items Addressed This Visit       Cardiovascular and Mediastinum   Hypertension with intolerance to multiple antihypertensive drugs    Currently on metoprolol.  We will try to add back hydralazine.        Digestive   Chronic constipation   Relevant Medications   lactulose (CHRONULAC) 10 GM/15ML solution     Endocrine   IFG (impaired fasting glucose)    A1c looks good today at 5.8.  Improved from previous of 6.1.  Great job just continue to work on [employer] and regular exercise.        Other   Pernicious anemia - Primary    We will continue with B12 shots for another month and then plan to space to monthly.      Relevant Orders   Aldosterone + renin activity w/ ratio   Hemoglobin A1c   TSH   Hepatitis C Antibody   Allergen Panel (27) + IGE  IgE Dog w/ Component Reflex   Estradiol   Follicle stimulating hormone   Luteinizing hormone   Progesterone   PARESTHESIA   Relevant Orders   Aldosterone + renin activity w/ ratio   Hemoglobin A1c   TSH   Hepatitis C Antibody   Allergen Panel (27) + IGE   IgE Dog w/ Component Reflex   Estradiol   Follicle stimulating hormone   Luteinizing hormone   Progesterone   Other Visit Diagnoses     Chest tightness       Relevant Orders   Aldosterone + renin  activity w/ ratio   Hemoglobin A1c   TSH   Hepatitis C Antibody   Allergen Panel (27) + IGE   IgE Dog w/ Component Reflex   Estradiol   Follicle stimulating hormone   Luteinizing hormone   Progesterone   Fatigue, unspecified type       Relevant Orders   Aldosterone + renin activity w/ ratio   Hemoglobin A1c   TSH   Hepatitis C Antibody   Allergen Panel (27) + IGE   IgE Dog w/ Component Reflex   Estradiol   Follicle stimulating hormone   Luteinizing hormone   Progesterone   Impaired fasting glucose       Relevant Orders   POCT HgB A1C (Completed)   Vitamin B deficiency       Relevant Medications   cyanocobalamin ((VITAMIN B-12)) injection 1,000 mcg (Completed)      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  She is just noted she has had more chest tightness and breathing problems around her neighbors home who owns a dog.  So we will do some antibody testing to see if she could be allergic she also wanted to go ahead and do a area allergen panel in regards to trees pollens etc.  Its been a few years since we have done this for her.  Fatigue-we will recheck her thyroid she would like a renin and aldosterone checked as well they have been abnormal in the past.  Meds ordered this encounter  Medications   cyanocobalamin ((VITAMIN B-12)) injection 1,000 mcg   lactulose (CHRONULAC) 10 GM/15ML solution    Sig: Take 45 mLs (30 g total) by mouth 2 (two) times daily as needed for mild constipation.    Dispense:  236 mL    Refill:  2   I spent 50 minutes on the day of the encounter to include pre-visit record review, face-to-face time with the patient and post visit ordering of test.   Follow-up: No follow-ups on file.    Beatrice Lecher, MD

## 2021-04-01 NOTE — Telephone Encounter (Signed)
Transition Care Management Follow-up Telephone Call Date of discharge and from where: 03/31/2021 from [facility] How have you been since you were released from the hospital? Pt stated that she is still feeling really badly. Pt does have appt with PCP and GI today and stated that she will try to make it to her appointments.  Any questions or concerns? No  Items Reviewed: Did the pt receive and understand the discharge instructions provided? Yes  Medications obtained and verified? Yes  Other? No  Any new allergies since your discharge? No  Dietary orders reviewed? No Do you have support at home? Yes   Functional Questionnaire: (I = Independent and D = Dependent) ADLs: I  Bathing/Dressing- I  Meal Prep- I  Eating- I  Maintaining continence- I  Transferring/Ambulation- I  Managing Meds- I   Follow up appointments reviewed:  PCP Hospital f/u appt confirmed? Yes  Scheduled to see C. Madilyn Fireman, MD on 04/01/2021 @ 3:20pm. [facility] f/u appt confirmed? Yes  Scheduled to see Bing Quarry, MD on 04/01/2021 @ 1:00pm. Are transportation arrangements needed? No  If their condition worsens, is the pt aware to call PCP or go to the Emergency Dept.? Yes Was the patient provided with contact information for the PCP's office or ED? Yes Was to pt encouraged to call back with questions or concerns? Yes

## 2021-04-01 NOTE — Telephone Encounter (Signed)
Patient contacted the on-call line in regards to rx for hydralazine. BP elevated during OV. Rx sent for Hydralazine 10 mg TID for 30 day supply. Advised to follow up with PCP as instructed. Lorrene Reid, PA-C

## 2021-04-03 DIAGNOSIS — R079 Chest pain, unspecified: Secondary | ICD-10-CM | POA: Diagnosis not present

## 2021-04-03 DIAGNOSIS — E1165 Type 2 diabetes mellitus with hyperglycemia: Secondary | ICD-10-CM | POA: Diagnosis not present

## 2021-04-03 DIAGNOSIS — I10 Essential (primary) hypertension: Secondary | ICD-10-CM | POA: Diagnosis not present

## 2021-04-03 DIAGNOSIS — R072 Precordial pain: Secondary | ICD-10-CM | POA: Diagnosis not present

## 2021-04-03 DIAGNOSIS — Z87891 Personal history of nicotine dependence: Secondary | ICD-10-CM | POA: Diagnosis not present

## 2021-04-04 ENCOUNTER — Other Ambulatory Visit: Payer: Self-pay | Admitting: Family Medicine

## 2021-04-04 ENCOUNTER — Encounter: Payer: Self-pay | Admitting: Family Medicine

## 2021-04-04 DIAGNOSIS — R1013 Epigastric pain: Secondary | ICD-10-CM

## 2021-04-04 DIAGNOSIS — R9431 Abnormal electrocardiogram [ECG] [EKG]: Secondary | ICD-10-CM | POA: Diagnosis not present

## 2021-04-04 LAB — HEMOGLOBIN A1C: Hemoglobin A1C: 6.3

## 2021-04-04 LAB — BASIC METABOLIC PANEL
BUN: 14 (ref 4–21)
CO2: 27 — AB (ref 13–22)
Chloride: 109 — AB (ref 99–108)
Creatinine: 0.7 (ref 0.5–1.1)
Glucose: 123
Potassium: 3.5 (ref 3.4–5.3)
Sodium: 141 (ref 137–147)

## 2021-04-04 LAB — COMPREHENSIVE METABOLIC PANEL: Calcium: 9 (ref 8.7–10.7)

## 2021-04-04 LAB — TSH: TSH: 1.12 (ref 0.41–5.90)

## 2021-04-05 DIAGNOSIS — D6489 Other specified anemias: Secondary | ICD-10-CM | POA: Diagnosis not present

## 2021-04-05 DIAGNOSIS — R159 Full incontinence of feces: Secondary | ICD-10-CM | POA: Diagnosis not present

## 2021-04-05 DIAGNOSIS — L290 Pruritus ani: Secondary | ICD-10-CM | POA: Diagnosis not present

## 2021-04-05 DIAGNOSIS — R0789 Other chest pain: Secondary | ICD-10-CM | POA: Diagnosis not present

## 2021-04-05 MED ORDER — LACTULOSE 10 GM/15ML PO SOLN: 30.0000 g | Freq: Two times a day (BID) | ORAL | 2 refills | Status: DC | PRN

## 2021-04-05 NOTE — Assessment & Plan Note (Signed)
A1c looks good today at 5.8.  Improved from previous of 6.1.  Great job just continue to work on [employer] and regular exercise.

## 2021-04-05 NOTE — Assessment & Plan Note (Signed)
We will continue with B12 shots for another month and then plan to space to monthly.

## 2021-04-05 NOTE — Assessment & Plan Note (Signed)
Currently on metoprolol.  We will try to add back hydralazine.

## 2021-04-06 DIAGNOSIS — R002 Palpitations: Secondary | ICD-10-CM | POA: Diagnosis not present

## 2021-04-06 DIAGNOSIS — R079 Chest pain, unspecified: Secondary | ICD-10-CM | POA: Diagnosis not present

## 2021-04-06 DIAGNOSIS — I10 Essential (primary) hypertension: Secondary | ICD-10-CM | POA: Diagnosis not present

## 2021-04-06 DIAGNOSIS — Z95818 Presence of other cardiac implants and grafts: Secondary | ICD-10-CM | POA: Diagnosis not present

## 2021-04-06 DIAGNOSIS — I471 Supraventricular tachycardia: Secondary | ICD-10-CM | POA: Diagnosis not present

## 2021-04-06 DIAGNOSIS — E876 Hypokalemia: Secondary | ICD-10-CM | POA: Diagnosis not present

## 2021-04-07 ENCOUNTER — Encounter: Payer: Self-pay | Admitting: Neurology

## 2021-04-08 DIAGNOSIS — R059 Cough, unspecified: Secondary | ICD-10-CM | POA: Diagnosis not present

## 2021-04-08 DIAGNOSIS — R0602 Shortness of breath: Secondary | ICD-10-CM | POA: Diagnosis not present

## 2021-04-08 DIAGNOSIS — R9431 Abnormal electrocardiogram [ECG] [EKG]: Secondary | ICD-10-CM | POA: Diagnosis not present

## 2021-04-11 DIAGNOSIS — Y999 Unspecified external cause status: Secondary | ICD-10-CM | POA: Diagnosis not present

## 2021-04-11 DIAGNOSIS — M25562 Pain in left knee: Secondary | ICD-10-CM | POA: Diagnosis not present

## 2021-04-11 DIAGNOSIS — M545 Low back pain, unspecified: Secondary | ICD-10-CM | POA: Diagnosis not present

## 2021-04-11 DIAGNOSIS — M549 Dorsalgia, unspecified: Secondary | ICD-10-CM | POA: Diagnosis not present

## 2021-04-11 DIAGNOSIS — M546 Pain in thoracic spine: Secondary | ICD-10-CM | POA: Diagnosis not present

## 2021-04-11 DIAGNOSIS — M25552 Pain in left hip: Secondary | ICD-10-CM | POA: Diagnosis not present

## 2021-04-11 DIAGNOSIS — R102 Pelvic and perineal pain: Secondary | ICD-10-CM | POA: Diagnosis not present

## 2021-04-11 DIAGNOSIS — M25551 Pain in right hip: Secondary | ICD-10-CM | POA: Diagnosis not present

## 2021-04-11 DIAGNOSIS — W19XXXA Unspecified fall, initial encounter: Secondary | ICD-10-CM | POA: Diagnosis not present

## 2021-04-12 ENCOUNTER — Ambulatory Visit: Payer: Medicare HMO | Admitting: Family

## 2021-04-12 ENCOUNTER — Encounter (HOSPITAL_BASED_OUTPATIENT_CLINIC_OR_DEPARTMENT_OTHER): Payer: Self-pay | Admitting: *Deleted

## 2021-04-12 ENCOUNTER — Telehealth: Payer: Self-pay | Admitting: Family Medicine

## 2021-04-12 ENCOUNTER — Other Ambulatory Visit: Payer: Self-pay

## 2021-04-12 ENCOUNTER — Other Ambulatory Visit (HOSPITAL_BASED_OUTPATIENT_CLINIC_OR_DEPARTMENT_OTHER): Payer: Self-pay

## 2021-04-12 ENCOUNTER — Emergency Department (HOSPITAL_BASED_OUTPATIENT_CLINIC_OR_DEPARTMENT_OTHER)
Admission: EM | Admit: 2021-04-12 | Discharge: 2021-04-12 | Disposition: A | Discharge status: Home / Self Care | Payer: Medicare HMO | Attending: Emergency Medicine | Admitting: Emergency Medicine

## 2021-04-12 DIAGNOSIS — H9202 Otalgia, left ear: Secondary | ICD-10-CM | POA: Diagnosis not present

## 2021-04-12 DIAGNOSIS — I10 Essential (primary) hypertension: Secondary | ICD-10-CM | POA: Diagnosis not present

## 2021-04-12 DIAGNOSIS — Z87891 Personal history of nicotine dependence: Secondary | ICD-10-CM | POA: Insufficient documentation

## 2021-04-12 DIAGNOSIS — Z8541 Personal history of malignant neoplasm of cervix uteri: Secondary | ICD-10-CM | POA: Diagnosis not present

## 2021-04-12 DIAGNOSIS — J45909 Unspecified asthma, uncomplicated: Secondary | ICD-10-CM | POA: Diagnosis not present

## 2021-04-12 DIAGNOSIS — Z79899 Other long term (current) drug therapy: Secondary | ICD-10-CM | POA: Insufficient documentation

## 2021-04-12 DIAGNOSIS — Z8616 Personal history of COVID-19: Secondary | ICD-10-CM | POA: Diagnosis not present

## 2021-04-12 MED ORDER — ACETIC ACID 2 % OT SOLN
4.0000 [drp] | Freq: Four times a day (QID) | OTIC | 0 refills | Status: DC
Start: 2021-04-12 — End: 2021-04-12
  Filled 2021-04-12 (×2): qty 15, 19d supply, fill #0

## 2021-04-12 MED ORDER — ACETIC ACID 2 % OT SOLN: 4.0000 [drp] | Freq: Four times a day (QID) | OTIC | 0 refills | Status: DC

## 2021-04-12 NOTE — ED Triage Notes (Signed)
C/o left ear pain x 3 days

## 2021-04-12 NOTE — Telephone Encounter (Signed)
Pt called around 3 this afternoon. She was stating she is having extreme ear pain in her left ear. She was insisting on being seen today. I tried to explain that we had nothing available and that I could get her scheduled with another provider tomorrow. She said she would drive to [facility], but she was feeling to loopy. Pt asked me to send message to provider, because she stated that provider was aware that she was having problems with her ear.
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
## 2021-04-14 DIAGNOSIS — R0789 Other chest pain: Secondary | ICD-10-CM | POA: Diagnosis not present

## 2021-04-14 DIAGNOSIS — R002 Palpitations: Secondary | ICD-10-CM | POA: Diagnosis not present

## 2021-04-14 DIAGNOSIS — Z20822 Contact with and (suspected) exposure to covid-19: Secondary | ICD-10-CM | POA: Diagnosis not present

## 2021-04-14 DIAGNOSIS — Z87891 Personal history of nicotine dependence: Secondary | ICD-10-CM | POA: Diagnosis not present

## 2021-04-14 DIAGNOSIS — R079 Chest pain, unspecified: Secondary | ICD-10-CM | POA: Diagnosis not present

## 2021-04-14 DIAGNOSIS — R0602 Shortness of breath: Secondary | ICD-10-CM | POA: Diagnosis not present

## 2021-04-14 DIAGNOSIS — I10 Essential (primary) hypertension: Secondary | ICD-10-CM | POA: Diagnosis not present

## 2021-04-15 ENCOUNTER — Encounter: Payer: Self-pay | Admitting: Family Medicine

## 2021-04-15 ENCOUNTER — Ambulatory Visit (INDEPENDENT_AMBULATORY_CARE_PROVIDER_SITE_OTHER): Payer: Medicare HMO | Admitting: Family Medicine

## 2021-04-15 ENCOUNTER — Other Ambulatory Visit: Payer: Self-pay

## 2021-04-15 VITALS — BP 171/83 | HR 96 | Temp 98.4°F | Ht 63.0 in | Wt 224.0 lb

## 2021-04-15 DIAGNOSIS — M7989 Other specified soft tissue disorders: Secondary | ICD-10-CM

## 2021-04-15 DIAGNOSIS — H9202 Otalgia, left ear: Secondary | ICD-10-CM

## 2021-04-15 DIAGNOSIS — I10 Essential (primary) hypertension: Secondary | ICD-10-CM

## 2021-04-15 DIAGNOSIS — J4540 Moderate persistent asthma, uncomplicated: Secondary | ICD-10-CM

## 2021-04-15 DIAGNOSIS — D510 Vitamin B12 deficiency anemia due to intrinsic factor deficiency: Secondary | ICD-10-CM | POA: Diagnosis not present

## 2021-04-15 DIAGNOSIS — R519 Headache, unspecified: Secondary | ICD-10-CM

## 2021-04-15 MED ORDER — SPIRONOLACTONE 25 MG PO TABS: ORAL_TABLET | ORAL | 1 refills | Status: DC

## 2021-04-15 MED ORDER — CYANOCOBALAMIN 1000 MCG/ML IJ SOLN
1000.0000 ug | Freq: Once | INTRAMUSCULAR | Status: AC
  Administered 2021-04-15: 1000 ug via INTRAMUSCULAR

## 2021-04-15 NOTE — Assessment & Plan Note (Signed)
And B12 injection today.

## 2021-04-15 NOTE — Assessment & Plan Note (Addendum)
BP not well controlled.  She did try half a tab of hydralazine so hopefully she will continue to add that to her regimen.  It can take 2 weeks for medication to reach full efficacy.

## 2021-04-15 NOTE — Assessment & Plan Note (Signed)
Negative allergy testing.  We did scan this into the chart.  Hopefully can reschedule her asthma follow-up appointment.
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
## 2021-04-16 DIAGNOSIS — I10 Essential (primary) hypertension: Secondary | ICD-10-CM | POA: Diagnosis not present

## 2021-04-16 DIAGNOSIS — R600 Localized edema: Secondary | ICD-10-CM | POA: Diagnosis not present

## 2021-04-16 DIAGNOSIS — R072 Precordial pain: Secondary | ICD-10-CM | POA: Diagnosis not present

## 2021-04-16 DIAGNOSIS — R000 Tachycardia, unspecified: Secondary | ICD-10-CM | POA: Diagnosis not present

## 2021-04-16 DIAGNOSIS — R110 Nausea: Secondary | ICD-10-CM | POA: Diagnosis not present

## 2021-04-16 DIAGNOSIS — R519 Headache, unspecified: Secondary | ICD-10-CM | POA: Diagnosis not present

## 2021-04-18 DIAGNOSIS — I10 Essential (primary) hypertension: Secondary | ICD-10-CM | POA: Diagnosis not present

## 2021-04-18 DIAGNOSIS — Z8249 Family history of ischemic heart disease and other diseases of the circulatory system: Secondary | ICD-10-CM | POA: Diagnosis not present

## 2021-04-18 DIAGNOSIS — R9431 Abnormal electrocardiogram [ECG] [EKG]: Secondary | ICD-10-CM | POA: Diagnosis not present

## 2021-04-18 DIAGNOSIS — Z79899 Other long term (current) drug therapy: Secondary | ICD-10-CM | POA: Diagnosis not present

## 2021-04-19 DIAGNOSIS — I10 Essential (primary) hypertension: Secondary | ICD-10-CM | POA: Diagnosis not present

## 2021-04-19 DIAGNOSIS — R064 Hyperventilation: Secondary | ICD-10-CM | POA: Diagnosis not present

## 2021-04-19 DIAGNOSIS — R1111 Vomiting without nausea: Secondary | ICD-10-CM | POA: Diagnosis not present

## 2021-04-19 DIAGNOSIS — R9431 Abnormal electrocardiogram [ECG] [EKG]: Secondary | ICD-10-CM | POA: Diagnosis not present

## 2021-04-19 DIAGNOSIS — Z743 Need for continuous supervision: Secondary | ICD-10-CM | POA: Diagnosis not present

## 2021-04-19 DIAGNOSIS — R457 State of emotional shock and stress, unspecified: Secondary | ICD-10-CM | POA: Diagnosis not present

## 2021-04-19 DIAGNOSIS — R079 Chest pain, unspecified: Secondary | ICD-10-CM | POA: Diagnosis not present

## 2021-04-20 ENCOUNTER — Other Ambulatory Visit: Payer: Self-pay | Admitting: *Deleted

## 2021-04-20 DIAGNOSIS — R030 Elevated blood-pressure reading, without diagnosis of hypertension: Secondary | ICD-10-CM | POA: Diagnosis not present

## 2021-04-20 DIAGNOSIS — R519 Headache, unspecified: Secondary | ICD-10-CM | POA: Diagnosis not present

## 2021-04-20 DIAGNOSIS — I10 Essential (primary) hypertension: Secondary | ICD-10-CM

## 2021-04-20 DIAGNOSIS — R059 Cough, unspecified: Secondary | ICD-10-CM | POA: Diagnosis not present

## 2021-04-20 DIAGNOSIS — R0602 Shortness of breath: Secondary | ICD-10-CM | POA: Diagnosis not present

## 2021-04-20 DIAGNOSIS — R1110 Vomiting, unspecified: Secondary | ICD-10-CM | POA: Diagnosis not present

## 2021-04-21 ENCOUNTER — Ambulatory Visit: Payer: Medicare HMO | Admitting: Internal Medicine

## 2021-04-21 ENCOUNTER — Encounter: Payer: Self-pay | Admitting: Family Medicine

## 2021-04-21 DIAGNOSIS — E785 Hyperlipidemia, unspecified: Secondary | ICD-10-CM | POA: Diagnosis not present

## 2021-04-21 DIAGNOSIS — E119 Type 2 diabetes mellitus without complications: Secondary | ICD-10-CM | POA: Diagnosis not present

## 2021-04-21 DIAGNOSIS — E876 Hypokalemia: Secondary | ICD-10-CM | POA: Diagnosis not present

## 2021-04-21 DIAGNOSIS — K219 Gastro-esophageal reflux disease without esophagitis: Secondary | ICD-10-CM | POA: Diagnosis not present

## 2021-04-21 DIAGNOSIS — J45909 Unspecified asthma, uncomplicated: Secondary | ICD-10-CM | POA: Diagnosis not present

## 2021-04-21 DIAGNOSIS — R9431 Abnormal electrocardiogram [ECG] [EKG]: Secondary | ICD-10-CM | POA: Diagnosis not present

## 2021-04-21 DIAGNOSIS — G35 Multiple sclerosis: Secondary | ICD-10-CM | POA: Diagnosis not present

## 2021-04-21 DIAGNOSIS — R0602 Shortness of breath: Secondary | ICD-10-CM | POA: Diagnosis not present

## 2021-04-21 DIAGNOSIS — F419 Anxiety disorder, unspecified: Secondary | ICD-10-CM | POA: Diagnosis not present

## 2021-04-21 DIAGNOSIS — I10 Essential (primary) hypertension: Secondary | ICD-10-CM | POA: Diagnosis not present

## 2021-04-21 DIAGNOSIS — R0600 Dyspnea, unspecified: Secondary | ICD-10-CM | POA: Diagnosis not present

## 2021-04-22 DIAGNOSIS — R0602 Shortness of breath: Secondary | ICD-10-CM | POA: Diagnosis not present

## 2021-04-22 DIAGNOSIS — R079 Chest pain, unspecified: Secondary | ICD-10-CM | POA: Diagnosis not present

## 2021-04-22 DIAGNOSIS — R072 Precordial pain: Secondary | ICD-10-CM | POA: Diagnosis not present

## 2021-04-22 DIAGNOSIS — Z87891 Personal history of nicotine dependence: Secondary | ICD-10-CM | POA: Diagnosis not present

## 2021-04-22 DIAGNOSIS — I10 Essential (primary) hypertension: Secondary | ICD-10-CM | POA: Diagnosis not present

## 2021-04-23 DIAGNOSIS — R079 Chest pain, unspecified: Secondary | ICD-10-CM | POA: Diagnosis not present

## 2021-04-23 DIAGNOSIS — R5383 Other fatigue: Secondary | ICD-10-CM | POA: Diagnosis not present

## 2021-04-23 DIAGNOSIS — R0789 Other chest pain: Secondary | ICD-10-CM | POA: Diagnosis not present

## 2021-04-23 DIAGNOSIS — R059 Cough, unspecified: Secondary | ICD-10-CM | POA: Diagnosis not present

## 2021-04-23 DIAGNOSIS — R0602 Shortness of breath: Secondary | ICD-10-CM | POA: Diagnosis not present

## 2021-04-24 IMAGING — US US EXTREM  UP VENOUS*L*
1 series · 13 of 24 positions shown · non-contrast
Comparison: None.

CLINICAL DATA: Pain and swelling in the left arm at the site of
prior IV placement.



[Series 1: us extrem up venous*left* · 13 of 30 slices shown]
[im 1/30]
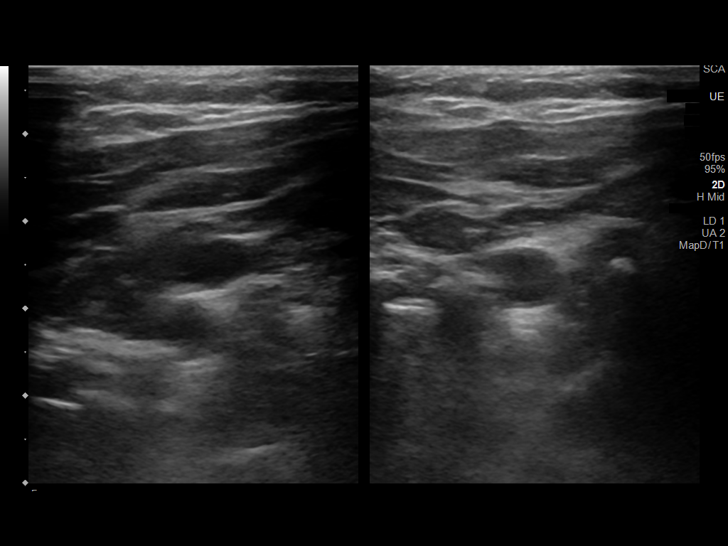
[im 3/30]
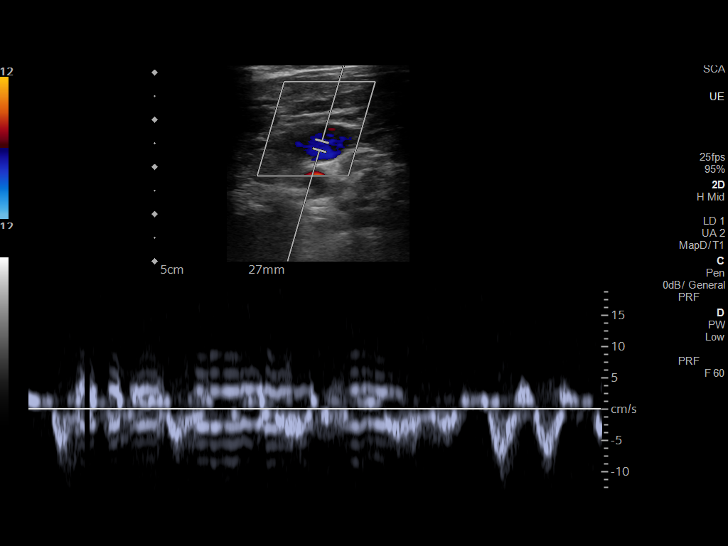
[im 6/30]
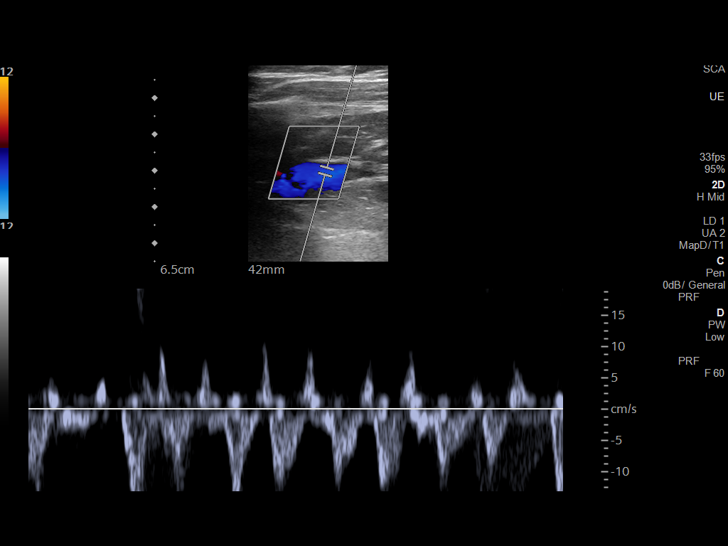
[im 8/30]
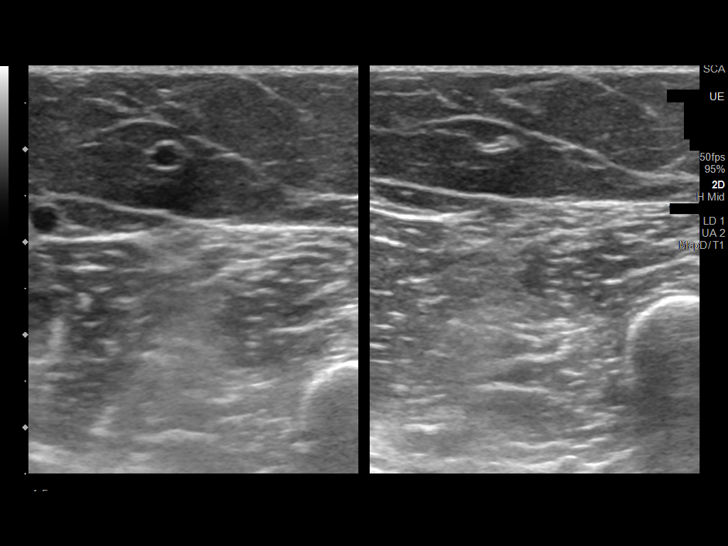
[im 11/30]
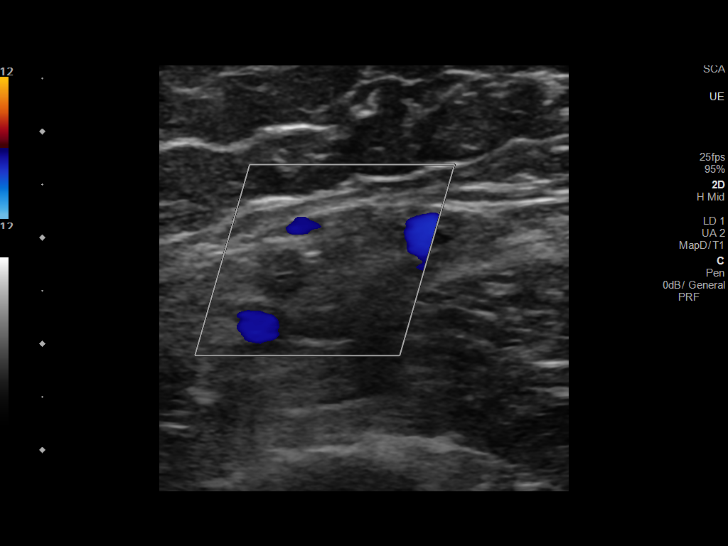
[im 13/30]
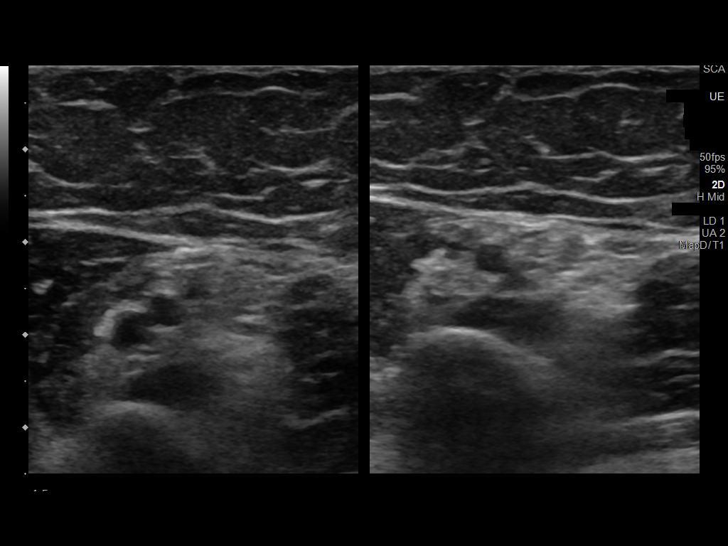
[im 16/30]
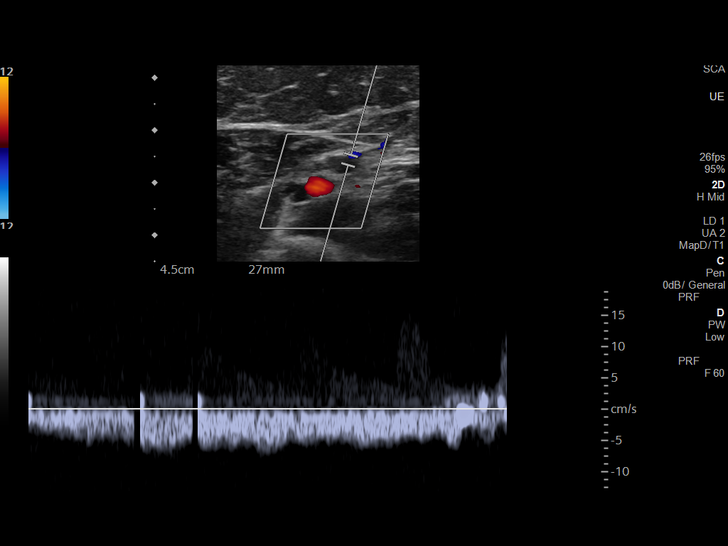
[im 17/30]
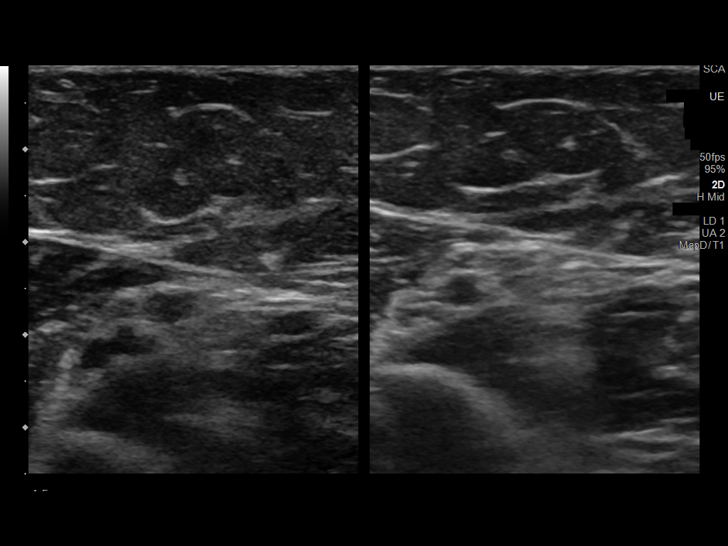
[im 19/30]
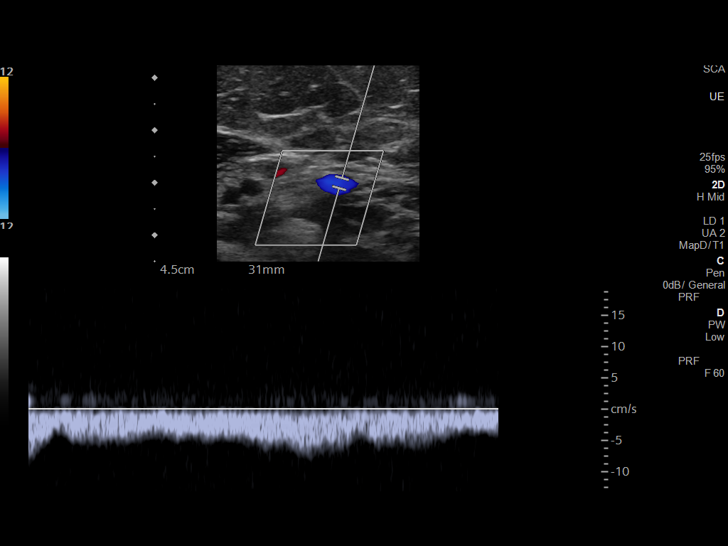
[im 22/30]
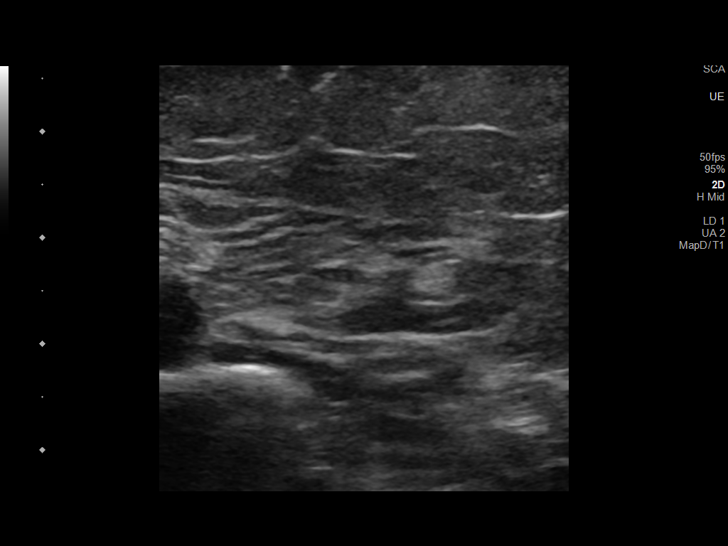
[im 24/30]
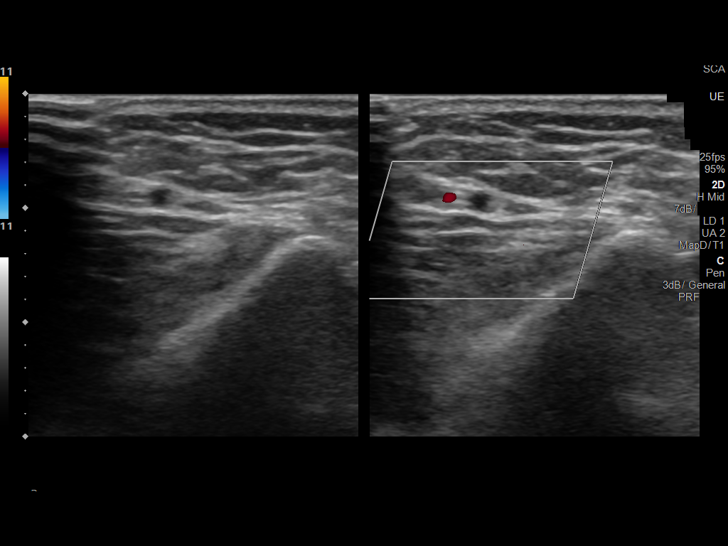
[im 27/30]
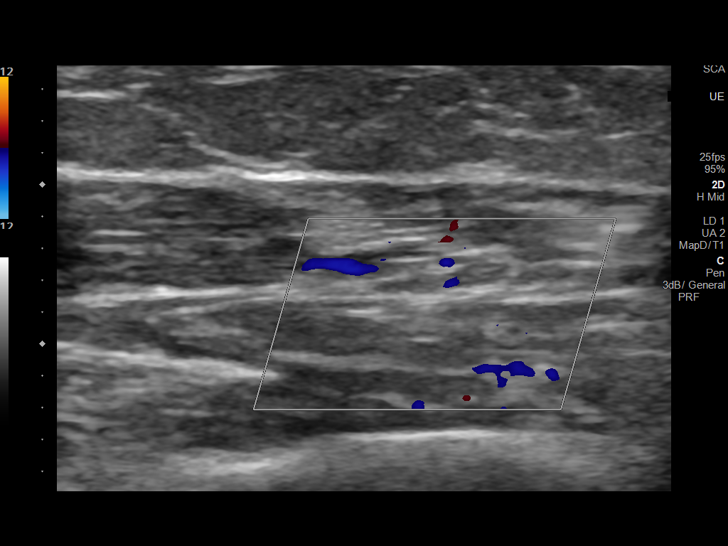
[im 30/30]
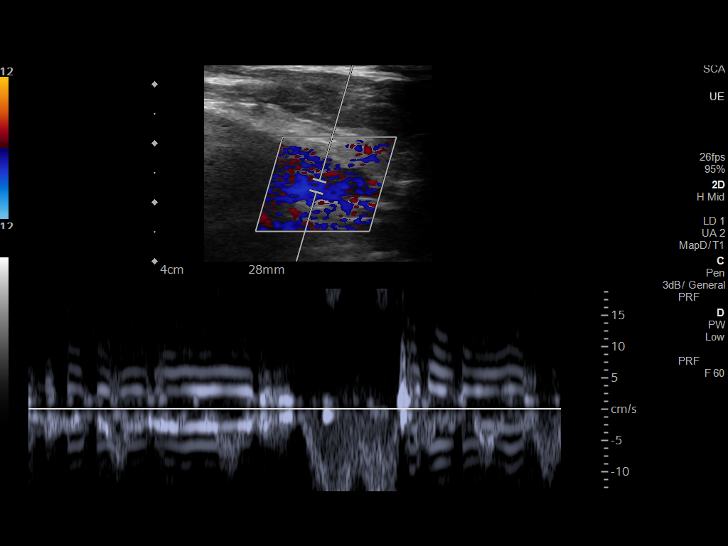

[13 of 24 positions shown; findings below may reference images not displayed]

FINDINGS: Contralateral Subclavian Vein: Respiratory phasicity is normal and
symmetric with the symptomatic side. No evidence of thrombus. Normal
compressibility.

Internal Jugular Vein: No evidence of thrombus. Normal
compressibility, respiratory phasicity and response to augmentation.

Subclavian Vein: No evidence of thrombus. Normal compressibility,
respiratory phasicity and response to augmentation.

Axillary Vein: No evidence of thrombus. Normal compressibility,
respiratory phasicity and response to augmentation.

Cephalic Vein: No evidence of thrombus. Normal compressibility,
respiratory phasicity and response to augmentation.

Basilic Vein: No evidence of thrombus. Normal compressibility,
respiratory phasicity and response to augmentation.

Brachial Veins: No evidence of thrombus. Normal compressibility,
respiratory phasicity and response to augmentation.

Radial Veins: No evidence of thrombus. Normal compressibility,
respiratory phasicity and response to augmentation.

Ulnar Veins: No evidence of thrombus. Normal compressibility,
respiratory phasicity and response to augmentation.

Venous Reflux:  None visualized.

Other Findings:  None visualized.
IMPRESSION: No evidence of DVT within the left upper extremity.

## 2021-04-25 DIAGNOSIS — I10 Essential (primary) hypertension: Secondary | ICD-10-CM | POA: Diagnosis not present

## 2021-04-25 DIAGNOSIS — R002 Palpitations: Secondary | ICD-10-CM | POA: Diagnosis not present

## 2021-04-25 DIAGNOSIS — R0602 Shortness of breath: Secondary | ICD-10-CM | POA: Diagnosis not present

## 2021-04-25 DIAGNOSIS — R9431 Abnormal electrocardiogram [ECG] [EKG]: Secondary | ICD-10-CM | POA: Diagnosis not present

## 2021-04-25 DIAGNOSIS — R079 Chest pain, unspecified: Secondary | ICD-10-CM | POA: Diagnosis not present

## 2021-04-25 DIAGNOSIS — M7989 Other specified soft tissue disorders: Secondary | ICD-10-CM | POA: Diagnosis not present

## 2021-04-26 ENCOUNTER — Telehealth: Payer: Self-pay | Admitting: Family

## 2021-04-26 ENCOUNTER — Encounter: Payer: Self-pay | Admitting: Family Medicine

## 2021-04-26 ENCOUNTER — Ambulatory Visit (INDEPENDENT_AMBULATORY_CARE_PROVIDER_SITE_OTHER): Payer: Medicare HMO | Admitting: Family Medicine

## 2021-04-26 VITALS — BP 154/89 | HR 103 | Ht 63.0 in | Wt 224.0 lb

## 2021-04-26 DIAGNOSIS — R0989 Other specified symptoms and signs involving the circulatory and respiratory systems: Secondary | ICD-10-CM

## 2021-04-26 DIAGNOSIS — I471 Supraventricular tachycardia: Secondary | ICD-10-CM | POA: Diagnosis not present

## 2021-04-26 DIAGNOSIS — J383 Other diseases of vocal cords: Secondary | ICD-10-CM

## 2021-04-26 DIAGNOSIS — R0602 Shortness of breath: Secondary | ICD-10-CM

## 2021-04-26 DIAGNOSIS — J4540 Moderate persistent asthma, uncomplicated: Secondary | ICD-10-CM | POA: Diagnosis not present

## 2021-04-26 DIAGNOSIS — F418 Other specified anxiety disorders: Secondary | ICD-10-CM

## 2021-04-26 DIAGNOSIS — D510 Vitamin B12 deficiency anemia due to intrinsic factor deficiency: Secondary | ICD-10-CM | POA: Diagnosis not present

## 2021-04-26 DIAGNOSIS — R059 Cough, unspecified: Secondary | ICD-10-CM | POA: Diagnosis not present

## 2021-04-26 LAB — PULMONARY FUNCTION TEST

## 2021-04-26 MED ORDER — BUDESONIDE-FORMOTEROL FUMARATE 80-4.5 MCG/ACT IN AERO: INHALATION_SPRAY | RESPIRATORY_TRACT | 2 refills | Status: DC

## 2021-04-26 MED ORDER — CLONAZEPAM 0.5 MG PO TABS: 0.2500 mg | ORAL_TABLET | Freq: Every day | ORAL | 0 refills | Status: DC | PRN

## 2021-04-26 MED ORDER — CYANOCOBALAMIN 1000 MCG/ML IJ SOLN
1000.0000 ug | Freq: Once | INTRAMUSCULAR | Status: AC
  Administered 2021-04-26: 1000 ug via INTRAMUSCULAR

## 2021-04-26 NOTE — Telephone Encounter (Signed)
Please advise Jennifer [name]        PT STATES SHE IS EXPERIENCING COUGHING, WHEEZING, SHORTNESS OF BREATH. I MADE PT AWARE THAT SHE HAD MISSED SEVERAL APPTS. AND THE NEXT AVAILABLE WAS OCT. SHE STATES SHE WOULD LIKE A CALL BACK.

## 2021-04-26 NOTE — Progress Notes (Signed)
Pt came in today for B12 injection. Injection  tolerated well.  Pt reports no negative side effects from medication. Denies any dizziness, chest pain or palpitations, and no GI problems.. injection given in LD.   Pt to have labs done before RTC for next b12 injection for instructions as to how to proceed.

## 2021-04-26 NOTE — Addendum Note (Signed)
Addended by: Isabel Caprice on: 04/26/2021 03:48 PM   Modules accepted: Orders

## 2021-04-26 NOTE — Telephone Encounter (Signed)
PT STATES SHE IS EXPERIENCING COUGHING, WHEEZING, SHORTNESS OF BREATH. I MADE PT AWARE THAT SHE HAD MISSED SEVERAL APPTS. AND THE NEXT AVAILABLE WAS OCT. SHE STATES SHE WOULD LIKE A CALL BACK.

## 2021-04-26 NOTE — Telephone Encounter (Signed)
Left patient a message asking about the symbicort and xopenex, and letting her know to go to the ER if they are not working for her, and she needs to keep her scheduled appointment.

## 2021-04-26 NOTE — Telephone Encounter (Signed)
Is she taking her Symbicort 80/4.5 mcg 2 puffs twice a day with spacer as recommended by Dr. Ernst Bowler? Is she using her Xopenex 2 puffs every 4-6 hours as needed for cough, wheeze, tightness in chest, or shortness of breath? It looks like prednisone is listed in her chart as an allergy that causes anxiety and palpitations. If these medications are not helping I recommend that she go to the ER. Make sure she keeps her follow up appointment since she has missed so many appointments.
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
## 2021-04-27 DIAGNOSIS — R059 Cough, unspecified: Secondary | ICD-10-CM | POA: Diagnosis not present

## 2021-04-27 DIAGNOSIS — R0602 Shortness of breath: Secondary | ICD-10-CM | POA: Diagnosis not present

## 2021-04-27 DIAGNOSIS — E611 Iron deficiency: Secondary | ICD-10-CM | POA: Diagnosis not present

## 2021-04-27 DIAGNOSIS — I10 Essential (primary) hypertension: Secondary | ICD-10-CM | POA: Diagnosis not present

## 2021-04-27 DIAGNOSIS — F418 Other specified anxiety disorders: Secondary | ICD-10-CM | POA: Diagnosis not present

## 2021-04-27 DIAGNOSIS — J4520 Mild intermittent asthma, uncomplicated: Secondary | ICD-10-CM | POA: Diagnosis not present

## 2021-04-27 DIAGNOSIS — D649 Anemia, unspecified: Secondary | ICD-10-CM | POA: Diagnosis not present

## 2021-04-27 DIAGNOSIS — Z6841 Body Mass Index (BMI) 40.0 and over, adult: Secondary | ICD-10-CM | POA: Diagnosis not present

## 2021-04-27 DIAGNOSIS — J3089 Other allergic rhinitis: Secondary | ICD-10-CM | POA: Diagnosis not present

## 2021-04-27 DIAGNOSIS — F419 Anxiety disorder, unspecified: Secondary | ICD-10-CM | POA: Diagnosis not present

## 2021-04-27 DIAGNOSIS — D510 Vitamin B12 deficiency anemia due to intrinsic factor deficiency: Secondary | ICD-10-CM | POA: Diagnosis not present

## 2021-04-27 DIAGNOSIS — G4733 Obstructive sleep apnea (adult) (pediatric): Secondary | ICD-10-CM | POA: Diagnosis not present

## 2021-04-27 DIAGNOSIS — J4550 Severe persistent asthma, uncomplicated: Secondary | ICD-10-CM | POA: Diagnosis not present

## 2021-04-27 NOTE — Assessment & Plan Note (Signed)
Spirometry today shows FVC of 74%, FEV1 of 81% with a ratio of 86.  Some slight decrease in the FVC but otherwise normal spirometry.

## 2021-04-27 NOTE — Assessment & Plan Note (Signed)
I did give her a small quantity of clonazepam to use sparingly.  I prescribed it a couple years ago but she never actually took it.  She can call if she has any problems or concerns.

## 2021-04-27 NOTE — Assessment & Plan Note (Signed)
Go ahead and place new referral to cardiology, Dr. Tomie China.

## 2021-04-27 NOTE — Assessment & Plan Note (Signed)
Fifth B12 injection given today recommend recheck B12 level in about 2 weeks before her next injection so we can better pace how often she needs the injection.

## 2021-04-28 DIAGNOSIS — R0602 Shortness of breath: Secondary | ICD-10-CM | POA: Diagnosis not present

## 2021-04-29 ENCOUNTER — Ambulatory Visit: Payer: Medicare HMO | Admitting: Family Medicine

## 2021-04-29 ENCOUNTER — Other Ambulatory Visit: Payer: Self-pay

## 2021-04-29 ENCOUNTER — Emergency Department (HOSPITAL_BASED_OUTPATIENT_CLINIC_OR_DEPARTMENT_OTHER)
Admission: EM | Admit: 2021-04-29 | Discharge: 2021-04-29 | Disposition: A | Discharge status: Home / Self Care | Payer: Medicare HMO | Attending: Emergency Medicine | Admitting: Emergency Medicine

## 2021-04-29 DIAGNOSIS — Z79899 Other long term (current) drug therapy: Secondary | ICD-10-CM | POA: Diagnosis not present

## 2021-04-29 DIAGNOSIS — J45909 Unspecified asthma, uncomplicated: Secondary | ICD-10-CM | POA: Insufficient documentation

## 2021-04-29 DIAGNOSIS — Z7951 Long term (current) use of inhaled steroids: Secondary | ICD-10-CM | POA: Diagnosis not present

## 2021-04-29 DIAGNOSIS — R0602 Shortness of breath: Secondary | ICD-10-CM | POA: Insufficient documentation

## 2021-04-29 DIAGNOSIS — Z8541 Personal history of malignant neoplasm of cervix uteri: Secondary | ICD-10-CM | POA: Diagnosis not present

## 2021-04-29 DIAGNOSIS — I10 Essential (primary) hypertension: Secondary | ICD-10-CM | POA: Diagnosis not present

## 2021-04-29 DIAGNOSIS — R059 Cough, unspecified: Secondary | ICD-10-CM | POA: Insufficient documentation

## 2021-04-29 DIAGNOSIS — Z8616 Personal history of COVID-19: Secondary | ICD-10-CM | POA: Insufficient documentation

## 2021-04-29 DIAGNOSIS — R079 Chest pain, unspecified: Secondary | ICD-10-CM | POA: Diagnosis not present

## 2021-04-29 DIAGNOSIS — R0600 Dyspnea, unspecified: Secondary | ICD-10-CM | POA: Diagnosis not present
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
## 2021-04-29 NOTE — Discharge Instructions (Addendum)
Follow-up with your ear nose and throat doctor, pulmonologist, and your primary care doctor.  I would suggest that you start the medication that pulmonary prescribed 2 days ago.

## 2021-04-29 NOTE — ED Notes (Signed)
EDP has been speaking with the Pt. About her reason for being here at length.  Pt. In no distress.  Pt. Has been assessed by EDP and is alert and oriented.

## 2021-04-29 NOTE — ED Triage Notes (Signed)
Shortness of breath , cough , mid chest pain . No obvious distress.

## 2021-05-02 ENCOUNTER — Inpatient Hospital Stay: Payer: Medicare HMO | Admitting: Family Medicine

## 2021-05-03 DIAGNOSIS — J40 Bronchitis, not specified as acute or chronic: Secondary | ICD-10-CM | POA: Diagnosis not present

## 2021-05-03 DIAGNOSIS — R9431 Abnormal electrocardiogram [ECG] [EKG]: Secondary | ICD-10-CM | POA: Diagnosis not present

## 2021-05-03 DIAGNOSIS — B370 Candidal stomatitis: Secondary | ICD-10-CM | POA: Diagnosis not present

## 2021-05-03 DIAGNOSIS — R079 Chest pain, unspecified: Secondary | ICD-10-CM | POA: Diagnosis not present

## 2021-05-04 DIAGNOSIS — G4733 Obstructive sleep apnea (adult) (pediatric): Secondary | ICD-10-CM | POA: Diagnosis not present

## 2021-05-04 DIAGNOSIS — J3089 Other allergic rhinitis: Secondary | ICD-10-CM | POA: Diagnosis not present

## 2021-05-04 DIAGNOSIS — J4530 Mild persistent asthma, uncomplicated: Secondary | ICD-10-CM | POA: Diagnosis not present

## 2021-05-04 DIAGNOSIS — F419 Anxiety disorder, unspecified: Secondary | ICD-10-CM | POA: Diagnosis not present

## 2021-05-05 DIAGNOSIS — R0789 Other chest pain: Secondary | ICD-10-CM | POA: Diagnosis not present

## 2021-05-05 DIAGNOSIS — I10 Essential (primary) hypertension: Secondary | ICD-10-CM | POA: Diagnosis not present

## 2021-05-05 DIAGNOSIS — R079 Chest pain, unspecified: Secondary | ICD-10-CM | POA: Diagnosis not present

## 2021-05-05 DIAGNOSIS — R002 Palpitations: Secondary | ICD-10-CM | POA: Diagnosis not present

## 2021-05-05 DIAGNOSIS — Z9889 Other specified postprocedural states: Secondary | ICD-10-CM | POA: Diagnosis not present

## 2021-05-06 ENCOUNTER — Ambulatory Visit: Payer: Medicare HMO | Admitting: Internal Medicine

## 2021-05-06 ENCOUNTER — Other Ambulatory Visit: Payer: Self-pay | Admitting: *Deleted

## 2021-05-06 DIAGNOSIS — D510 Vitamin B12 deficiency anemia due to intrinsic factor deficiency: Secondary | ICD-10-CM

## 2021-05-06 DIAGNOSIS — R0989 Other specified symptoms and signs involving the circulatory and respiratory systems: Secondary | ICD-10-CM | POA: Diagnosis not present

## 2021-05-06 DIAGNOSIS — R1310 Dysphagia, unspecified: Secondary | ICD-10-CM | POA: Diagnosis not present

## 2021-05-06 IMAGING — CR DG ABDOMEN ACUTE W/ 1V CHEST
3 series · 3 of 3 positions shown · non-contrast
Comparison: 09/01/2019, 01/24/2020

CLINICAL DATA: Right lower quadrant abdominal pain, nausea and
vomiting for 2 days

EXAM:
DG ABDOMEN ACUTE W/ 1V CHEST

[w chest pa]
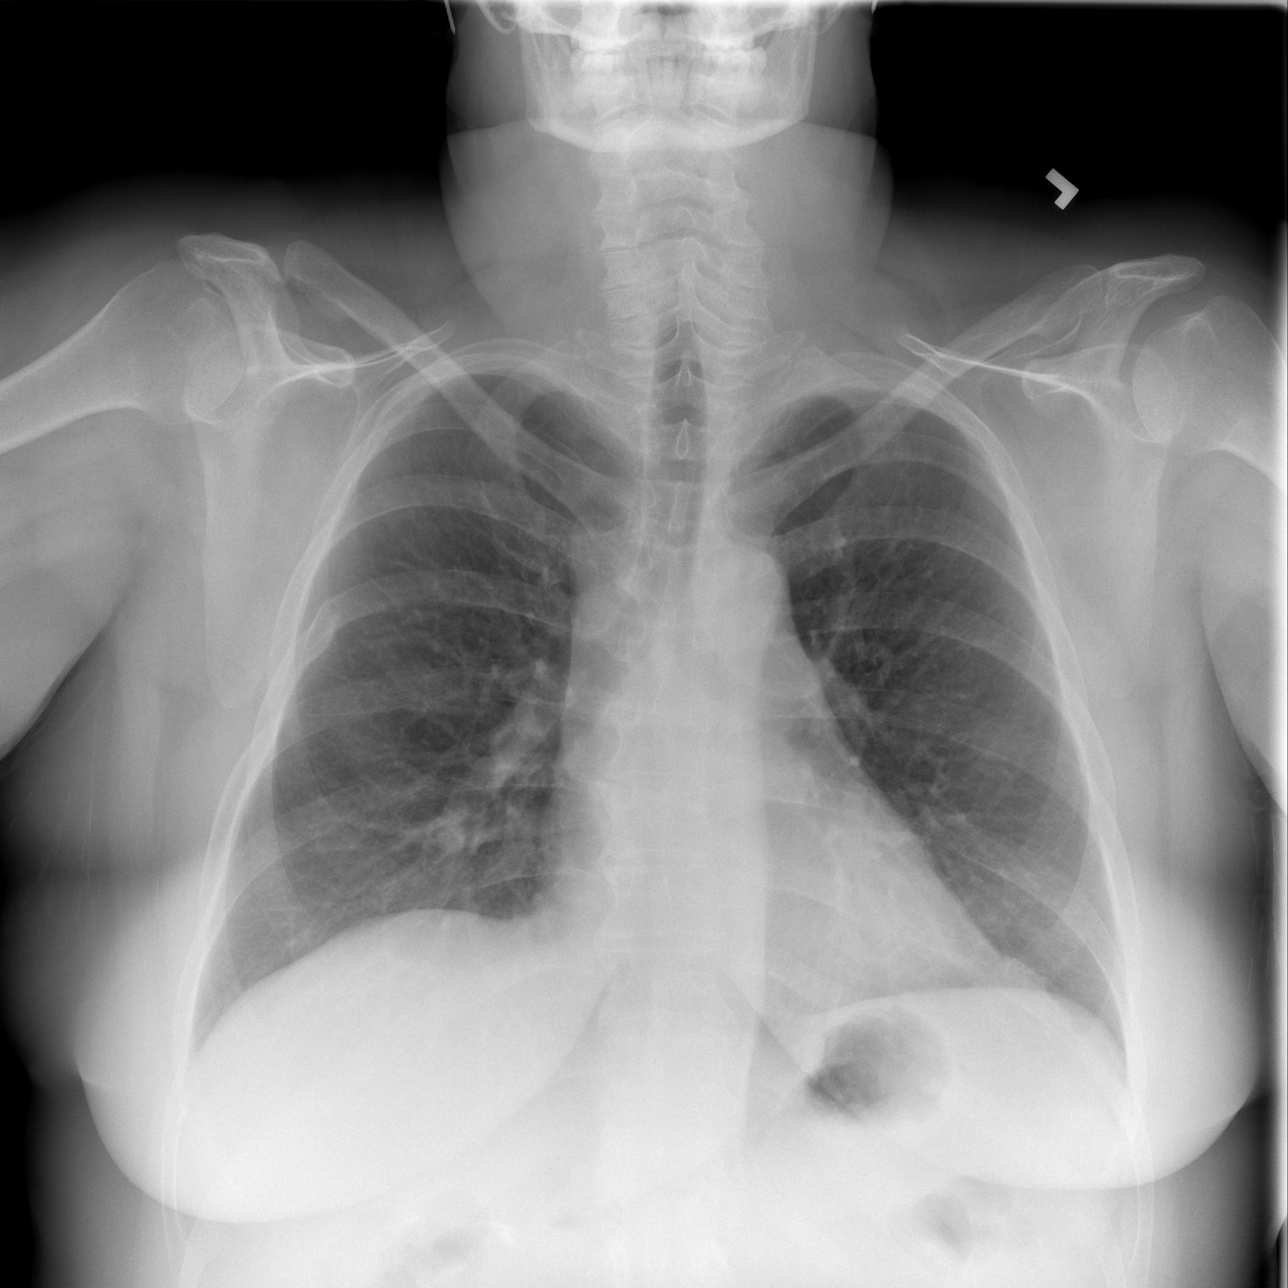

[w abdomen upright]
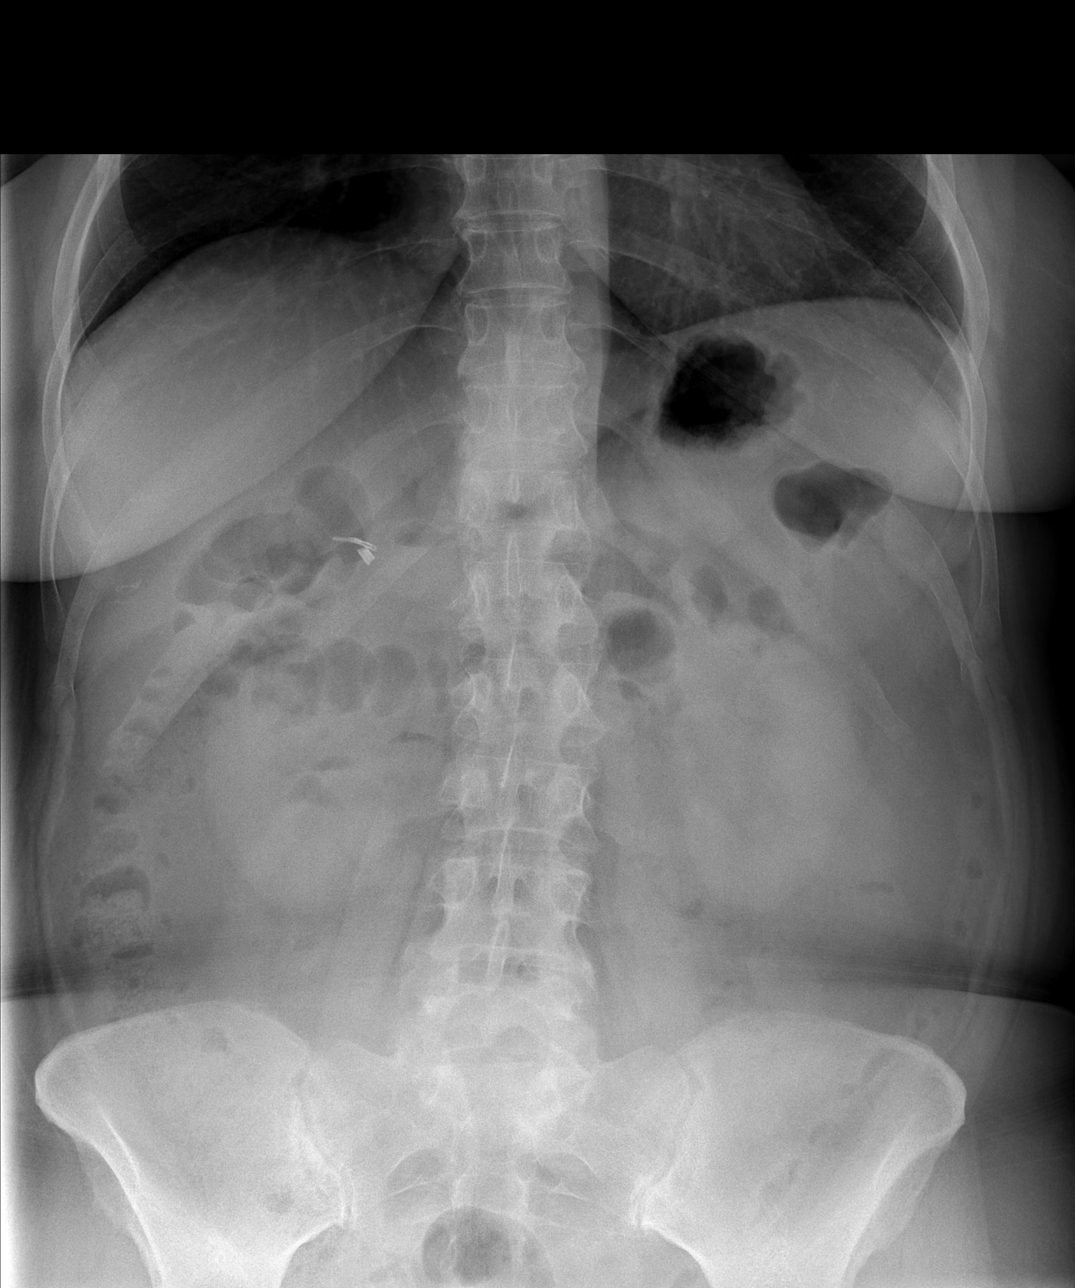

[t abdomen supine]
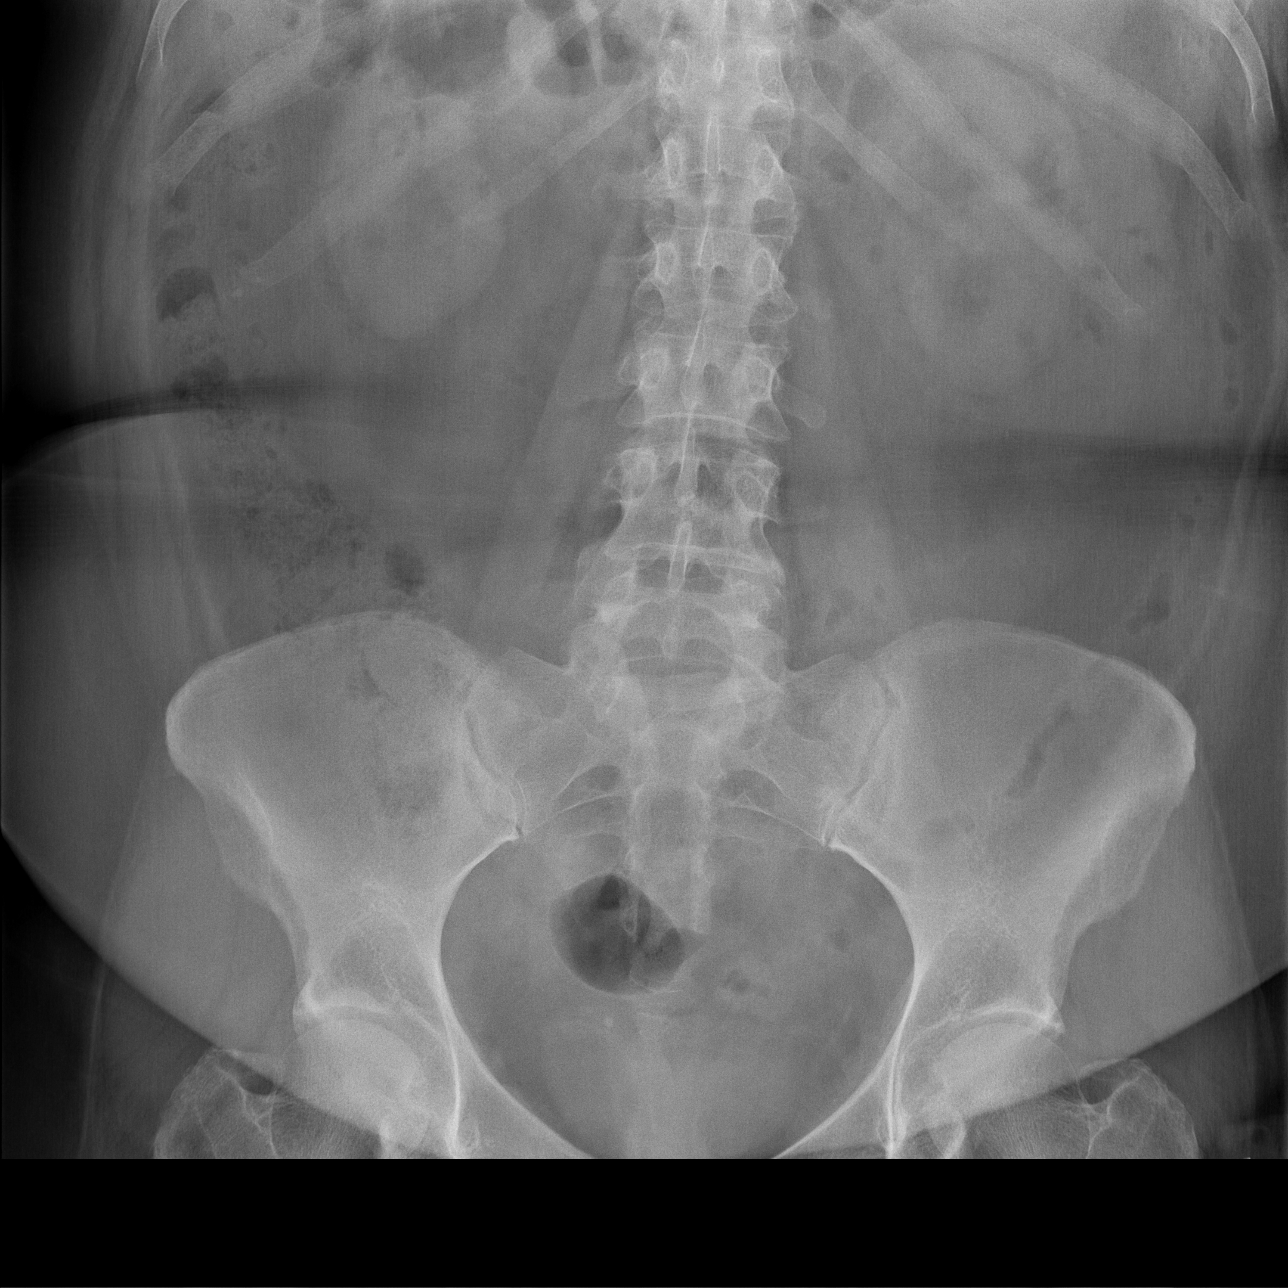

[3 of 3 positions shown; findings below may reference images not displayed]

FINDINGS: Supine and upright frontal views of the abdomen and pelvis as well
as an upright frontal view of the chest are obtained. Cardiac
silhouette is unremarkable. No airspace disease, effusion, or
pneumothorax.

Bowel gas pattern is unremarkable with no obstruction or ileus. No
masses or abnormal calcifications. No free gas in the greater
peritoneal sac. Bony structures are unremarkable.
IMPRESSION: 1. Unremarkable abdominal series.

## 2021-05-06 IMAGING — US US EXTREM LOW VENOUS*R*
1 series · 13 of 24 positions shown · non-contrast
Comparison: None.

CLINICAL DATA: Initial evaluation for right leg swelling.

EXAM:
RIGHT LOWER EXTREMITY VENOUS DOPPLER ULTRASOUND
TECHNIQUE: Gray-scale sonography with graded compression, as well as color
Doppler and duplex ultrasound were performed to evaluate the lower
extremity deep venous systems from the level of the common femoral
vein and including the common femoral, femoral, profunda femoral,
popliteal and calf veins including the posterior tibial, peroneal
and gastrocnemius veins when visible. The superficial great
saphenous vein was also interrogated. Spectral Doppler was utilized
to evaluate flow at rest and with distal augmentation maneuvers in
the common femoral, femoral and popliteal veins.

[Series 1: us extrem low venous*right* · 13 of 33 slices shown]
[im 1/33]
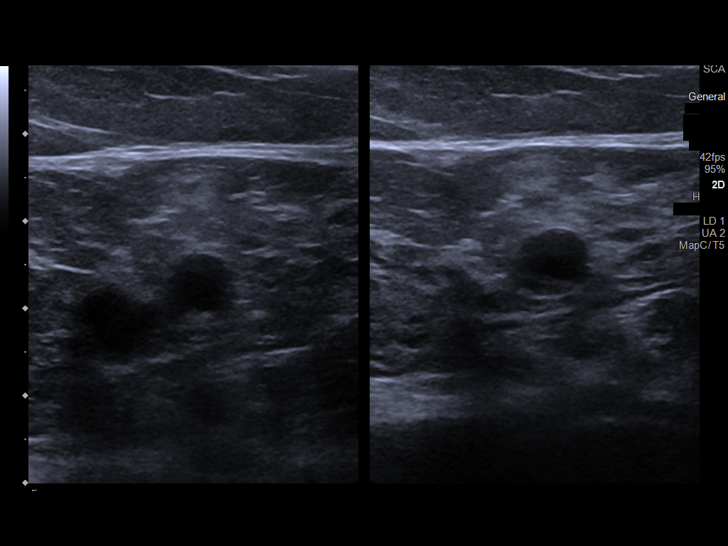
[im 3/33]
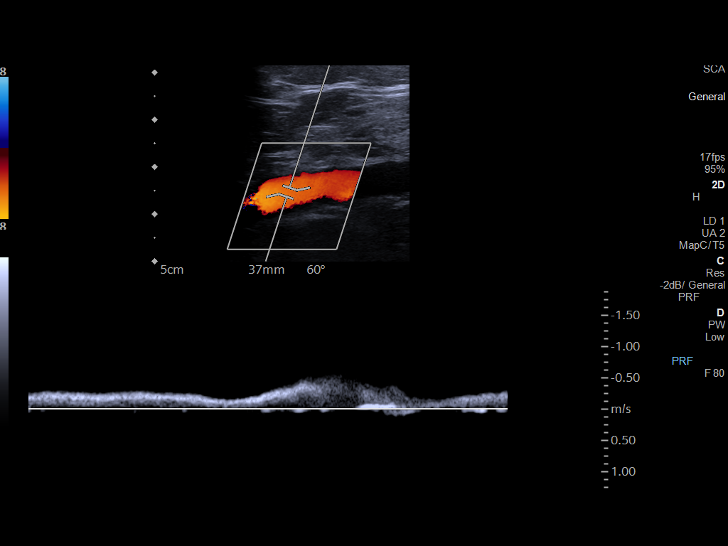
[im 6/33]
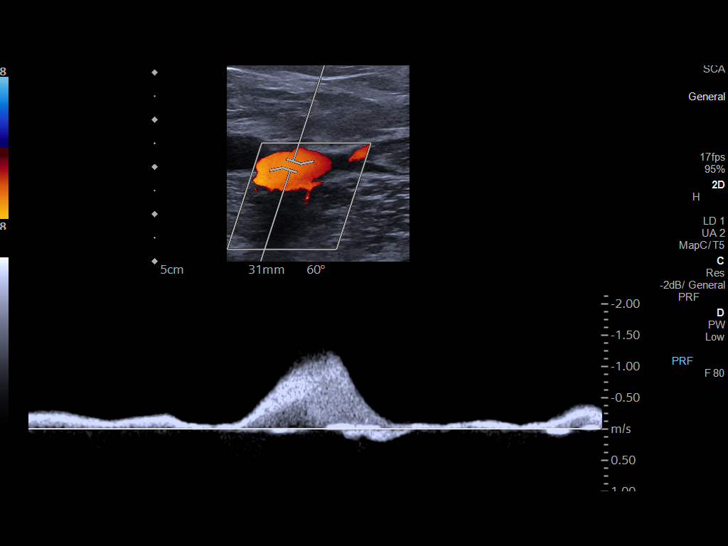
[im 9/33]
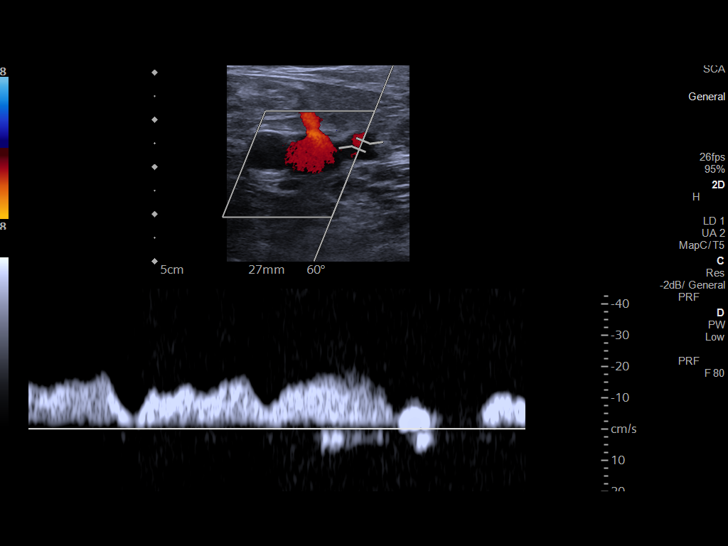
[im 12/33]
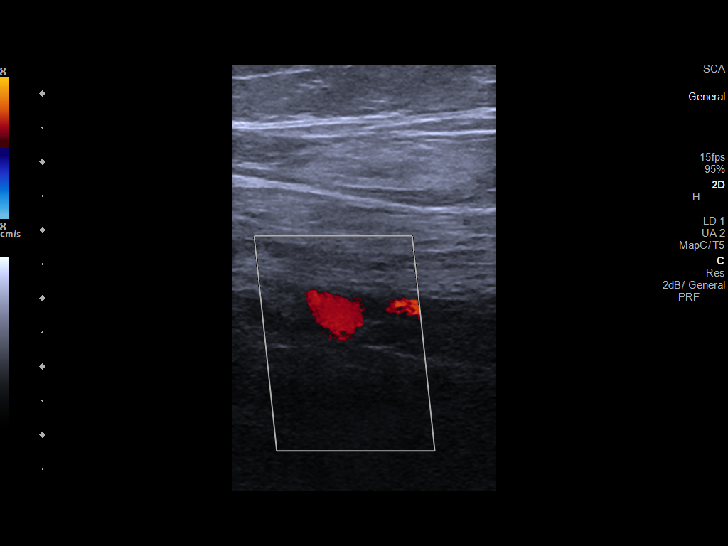
[im 14/33]
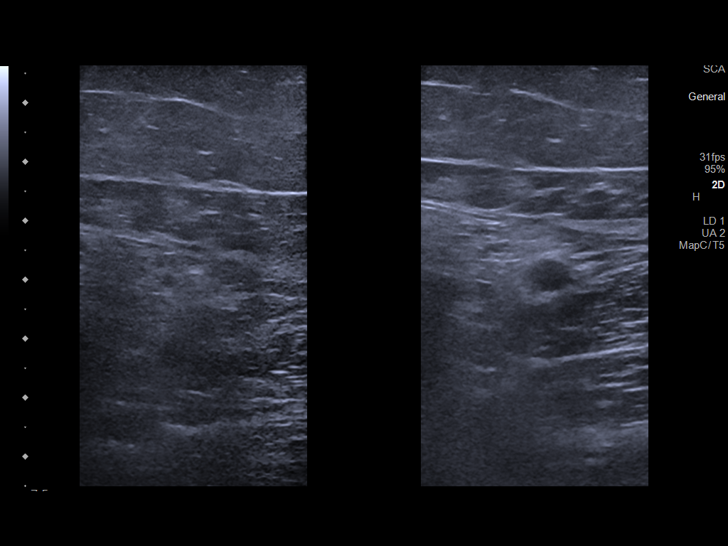
[im 17/33]
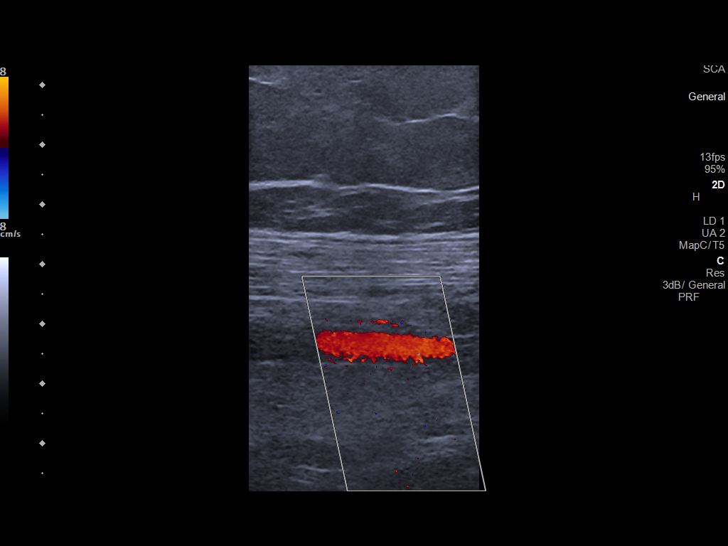
[im 19/33]
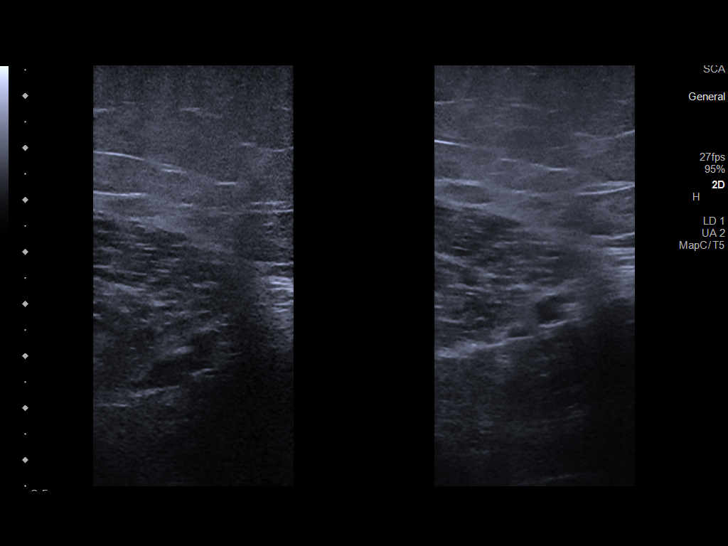
[im 21/33]
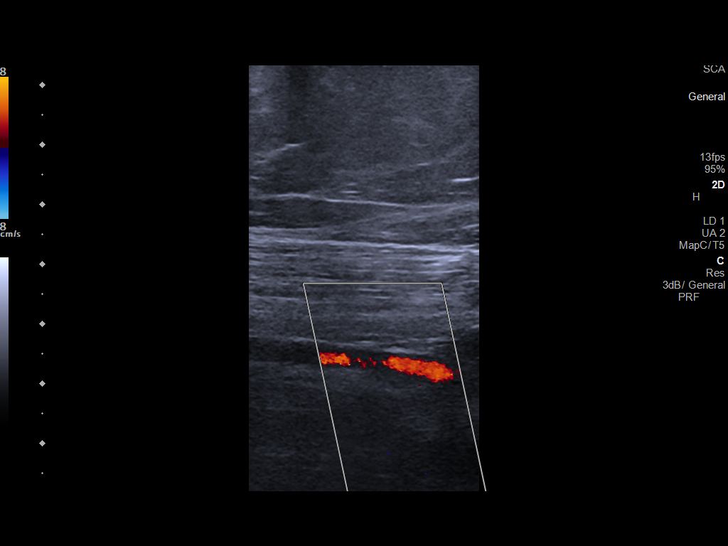
[im 24/33]
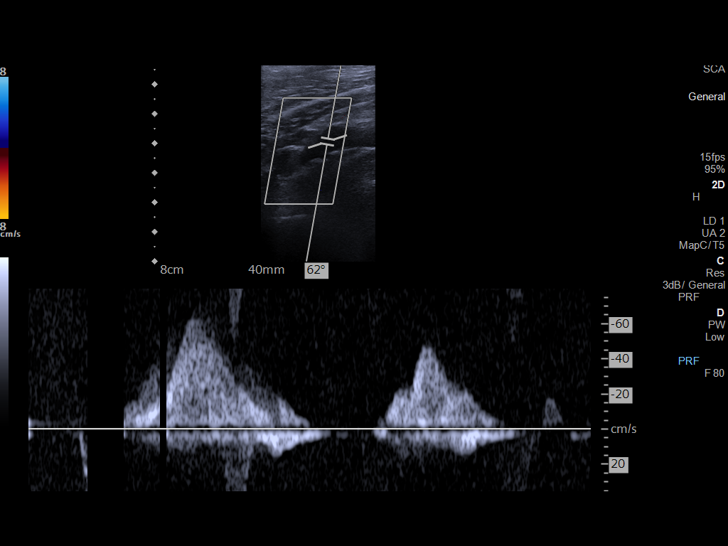
[im 27/33]
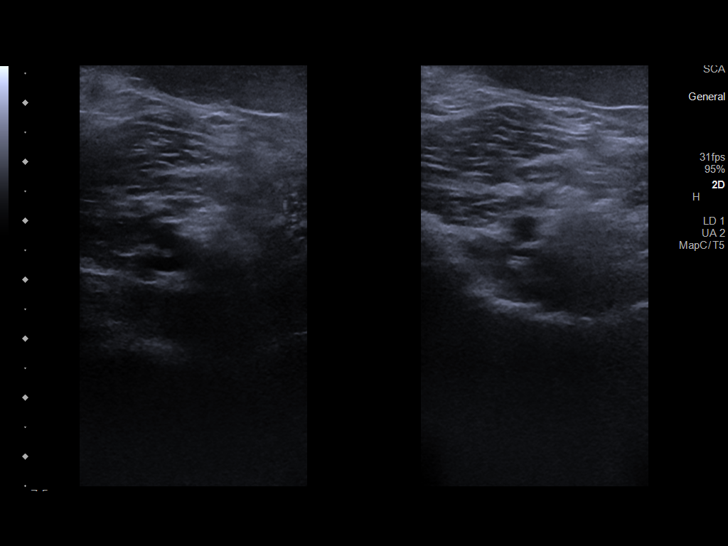
[im 30/33]
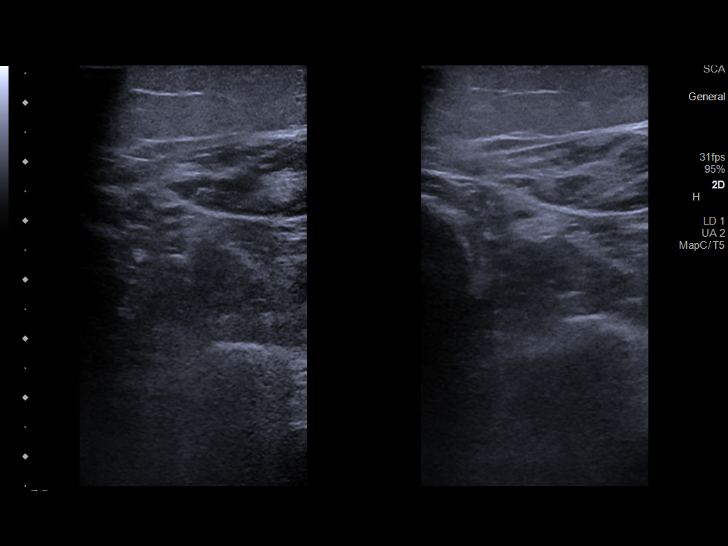
[im 33/33]
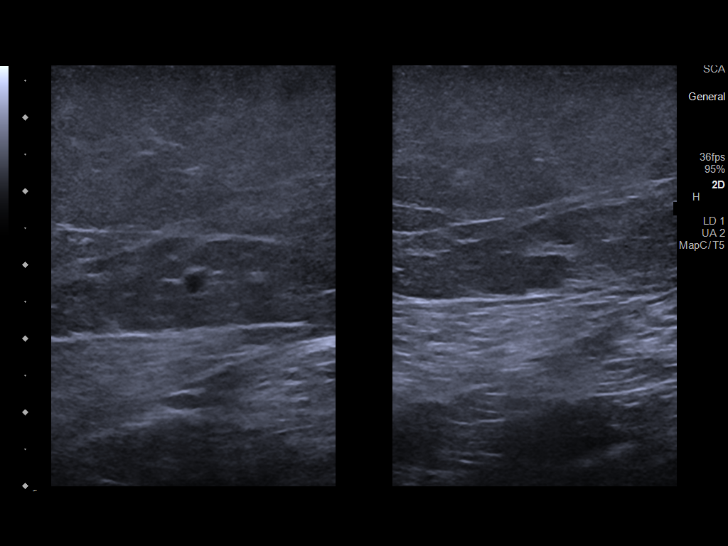

[13 of 24 positions shown; findings below may reference images not displayed]

FINDINGS: Contralateral Common Femoral Vein: Respiratory phasicity is normal
and symmetric with the symptomatic side. No evidence of thrombus.
Normal compressibility.

Common Femoral Vein: No evidence of thrombus. Normal
compressibility, respiratory phasicity and response to augmentation.

Saphenofemoral Junction: No evidence of thrombus. Normal
compressibility and flow on color Doppler imaging.

Profunda Femoral Vein: No evidence of thrombus. Normal
compressibility and flow on color Doppler imaging.

Femoral Vein: No evidence of thrombus. Normal compressibility,
respiratory phasicity and response to augmentation.

Popliteal Vein: No evidence of thrombus. Normal compressibility,
respiratory phasicity and response to augmentation.

Calf Veins: No evidence of thrombus. Normal compressibility and flow
on color Doppler imaging.

Superficial Great Saphenous Vein: No evidence of thrombus. Normal
compressibility.

Venous Reflux:  None.

Other Findings:  None.
IMPRESSION: No evidence of deep venous thrombosis.

## 2021-05-06 MED ORDER — DIAZEPAM 2 MG PO TABS: 2.0000 mg | ORAL_TABLET | Freq: Every day | ORAL | 0 refills | Status: DC | PRN

## 2021-05-06 NOTE — Telephone Encounter (Signed)
Pt called and advised that this medication was sent to her pharmacy.

## 2021-05-06 NOTE — Telephone Encounter (Signed)
Pt called and lvm stating that she doesn't feel comfortable taking the Clonazepam 0.5 mg due to this being a long acting medication. She stated that she was given Diazepam 2 mg and did well on this in the past and would like for this to be sent in to replace the Clonazepam.

## 2021-05-06 NOTE — Addendum Note (Signed)
Addended by: Teddy Spike on: 05/06/2021 01:20 PM   Modules accepted: Orders

## 2021-05-08 ENCOUNTER — Other Ambulatory Visit: Payer: Self-pay | Admitting: Physician Assistant

## 2021-05-08 DIAGNOSIS — I10 Essential (primary) hypertension: Secondary | ICD-10-CM

## 2021-05-09 ENCOUNTER — Encounter: Payer: Self-pay | Admitting: Family Medicine

## 2021-05-09 ENCOUNTER — Encounter (HOSPITAL_BASED_OUTPATIENT_CLINIC_OR_DEPARTMENT_OTHER): Payer: Self-pay | Admitting: *Deleted

## 2021-05-09 ENCOUNTER — Other Ambulatory Visit: Payer: Self-pay

## 2021-05-09 DIAGNOSIS — Z87891 Personal history of nicotine dependence: Secondary | ICD-10-CM | POA: Insufficient documentation

## 2021-05-09 DIAGNOSIS — M25512 Pain in left shoulder: Secondary | ICD-10-CM | POA: Diagnosis not present

## 2021-05-09 DIAGNOSIS — R079 Chest pain, unspecified: Secondary | ICD-10-CM | POA: Diagnosis not present

## 2021-05-09 DIAGNOSIS — R1012 Left upper quadrant pain: Secondary | ICD-10-CM | POA: Diagnosis not present

## 2021-05-09 DIAGNOSIS — R9431 Abnormal electrocardiogram [ECG] [EKG]: Secondary | ICD-10-CM | POA: Diagnosis not present

## 2021-05-09 DIAGNOSIS — R1084 Generalized abdominal pain: Secondary | ICD-10-CM | POA: Insufficient documentation

## 2021-05-09 DIAGNOSIS — Z8541 Personal history of malignant neoplasm of cervix uteri: Secondary | ICD-10-CM | POA: Insufficient documentation

## 2021-05-09 DIAGNOSIS — D510 Vitamin B12 deficiency anemia due to intrinsic factor deficiency: Secondary | ICD-10-CM | POA: Diagnosis not present

## 2021-05-09 DIAGNOSIS — Z8616 Personal history of COVID-19: Secondary | ICD-10-CM | POA: Insufficient documentation

## 2021-05-09 DIAGNOSIS — J4530 Mild persistent asthma, uncomplicated: Secondary | ICD-10-CM | POA: Insufficient documentation

## 2021-05-09 DIAGNOSIS — I10 Essential (primary) hypertension: Secondary | ICD-10-CM | POA: Insufficient documentation

## 2021-05-09 DIAGNOSIS — S2241XA Multiple fractures of ribs, right side, initial encounter for closed fracture: Secondary | ICD-10-CM | POA: Diagnosis not present

## 2021-05-09 DIAGNOSIS — R0789 Other chest pain: Secondary | ICD-10-CM | POA: Diagnosis not present

## 2021-05-09 DIAGNOSIS — R1030 Lower abdominal pain, unspecified: Secondary | ICD-10-CM | POA: Diagnosis present

## 2021-05-09 DIAGNOSIS — Z86011 Personal history of benign neoplasm of the brain: Secondary | ICD-10-CM | POA: Diagnosis not present

## 2021-05-09 DIAGNOSIS — K5900 Constipation, unspecified: Secondary | ICD-10-CM | POA: Diagnosis not present

## 2021-05-09 DIAGNOSIS — Z9049 Acquired absence of other specified parts of digestive tract: Secondary | ICD-10-CM | POA: Diagnosis not present

## 2021-05-09 DIAGNOSIS — Z79899 Other long term (current) drug therapy: Secondary | ICD-10-CM | POA: Insufficient documentation

## 2021-05-09 DIAGNOSIS — R109 Unspecified abdominal pain: Secondary | ICD-10-CM | POA: Diagnosis not present

## 2021-05-09 LAB — URINALYSIS, ROUTINE W REFLEX MICROSCOPIC
Bilirubin Urine: NEGATIVE
Glucose, UA: NEGATIVE mg/dL
Hgb urine dipstick: NEGATIVE
Ketones, ur: NEGATIVE mg/dL
Leukocytes,Ua: NEGATIVE
Nitrite: NEGATIVE
Protein, ur: NEGATIVE mg/dL
Specific Gravity, Urine: 1.02 (ref 1.005–1.030)
pH: 5 (ref 5.0–8.0)

## 2021-05-09 NOTE — ED Triage Notes (Signed)
C/o abd pain and bloating ? Constipation

## 2021-05-10 ENCOUNTER — Emergency Department (HOSPITAL_BASED_OUTPATIENT_CLINIC_OR_DEPARTMENT_OTHER): Payer: Medicare HMO

## 2021-05-10 ENCOUNTER — Emergency Department (HOSPITAL_BASED_OUTPATIENT_CLINIC_OR_DEPARTMENT_OTHER)
Admission: EM | Admit: 2021-05-10 | Discharge: 2021-05-10 | Disposition: A | Discharge status: Home / Self Care | Payer: Medicare HMO | Attending: Emergency Medicine | Admitting: Emergency Medicine

## 2021-05-10 DIAGNOSIS — R9431 Abnormal electrocardiogram [ECG] [EKG]: Secondary | ICD-10-CM | POA: Diagnosis not present

## 2021-05-10 DIAGNOSIS — R109 Unspecified abdominal pain: Secondary | ICD-10-CM | POA: Diagnosis not present

## 2021-05-10 DIAGNOSIS — Z9049 Acquired absence of other specified parts of digestive tract: Secondary | ICD-10-CM | POA: Diagnosis not present

## 2021-05-10 DIAGNOSIS — S2241XA Multiple fractures of ribs, right side, initial encounter for closed fracture: Secondary | ICD-10-CM | POA: Diagnosis not present

## 2021-05-10 LAB — COMPREHENSIVE METABOLIC PANEL
ALT: 18 U/L (ref 0–44)
AST: 14 U/L — ABNORMAL LOW (ref 15–41)
Albumin: 3.7 g/dL (ref 3.5–5.0)
Alkaline Phosphatase: 65 U/L (ref 38–126)
Anion gap: 6 (ref 5–15)
BUN: 14 mg/dL (ref 6–20)
CO2: 25 mmol/L (ref 22–32)
Calcium: 8.7 mg/dL — ABNORMAL LOW (ref 8.9–10.3)
Chloride: 109 mmol/L (ref 98–111)
Creatinine, Ser: 0.5 mg/dL (ref 0.44–1.00)
GFR, Estimated: >60 mL/min (ref >60)
Glucose, Bld: 113 mg/dL — ABNORMAL HIGH (ref 70–99)
Potassium: 3.4 mmol/L — ABNORMAL LOW (ref 3.5–5.1)
Sodium: 140 mmol/L (ref 135–145)
Total Bilirubin: 0.3 mg/dL (ref 0.3–1.2)
Total Protein: 6.8 g/dL (ref 6.5–8.1)

## 2021-05-10 LAB — CBC
HCT: 41 % (ref 36.0–46.0)
Hemoglobin: 13.3 g/dL (ref 12.0–15.0)
MCH: 29.5 pg (ref 26.0–34.0)
MCHC: 32.4 g/dL (ref 30.0–36.0)
MCV: 90.9 fL (ref 80.0–100.0)
Platelets: 185 10*3/uL (ref 150–400)
RBC: 4.51 MIL/uL (ref 3.87–5.11)
RDW: 13.5 % (ref 11.5–15.5)
WBC: 6.3 10*3/uL (ref 4.0–10.5)
nRBC: 0 % (ref 0.0–0.2)

## 2021-05-10 LAB — TROPONIN I (HIGH SENSITIVITY): Troponin I (High Sensitivity): 3 ng/L (ref <18)

## 2021-05-10 LAB — LIPASE, BLOOD: Lipase: 37 U/L (ref 11–51)

## 2021-05-10 MED ORDER — ACETAMINOPHEN 500 MG PO TABS
1000.0000 mg | ORAL_TABLET | Freq: Once | ORAL | Status: AC
  Administered 2021-05-10: 1000 mg via ORAL
  Filled 2021-05-10: qty 2

## 2021-05-10 MED ORDER — ALUM & MAG HYDROXIDE-SIMETH 200-200-20 MG/5ML PO SUSP
30.0000 mL | Freq: Once | ORAL | Status: AC
  Administered 2021-05-10: 30 mL via ORAL
  Filled 2021-05-10: qty 30

## 2021-05-10 MED ORDER — HYOSCYAMINE SULFATE 0.125 MG SL SUBL
0.2500 mg | SUBLINGUAL_TABLET | Freq: Once | SUBLINGUAL | Status: AC
  Administered 2021-05-10: 0.25 mg via SUBLINGUAL
  Filled 2021-05-10: qty 2
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
## 2021-05-10 NOTE — ED Notes (Signed)
Pt called RN in room stating she has a headache and chest feels heavy; VSS, BP 156/90, HR 74, O2 100% on RA; EDP made aware

## 2021-05-12 DIAGNOSIS — I471 Supraventricular tachycardia: Secondary | ICD-10-CM | POA: Diagnosis not present

## 2021-05-12 DIAGNOSIS — R600 Localized edema: Secondary | ICD-10-CM | POA: Diagnosis not present

## 2021-05-12 DIAGNOSIS — K5900 Constipation, unspecified: Secondary | ICD-10-CM | POA: Diagnosis not present

## 2021-05-12 DIAGNOSIS — F419 Anxiety disorder, unspecified: Secondary | ICD-10-CM | POA: Diagnosis not present

## 2021-05-13 ENCOUNTER — Telehealth (INDEPENDENT_AMBULATORY_CARE_PROVIDER_SITE_OTHER): Payer: Medicare HMO | Admitting: Family Medicine

## 2021-05-13 ENCOUNTER — Encounter: Payer: Self-pay | Admitting: Family Medicine

## 2021-05-13 ENCOUNTER — Ambulatory Visit: Payer: Medicare HMO | Admitting: Family Medicine

## 2021-05-13 DIAGNOSIS — I10 Essential (primary) hypertension: Secondary | ICD-10-CM | POA: Diagnosis not present

## 2021-05-13 DIAGNOSIS — D510 Vitamin B12 deficiency anemia due to intrinsic factor deficiency: Secondary | ICD-10-CM | POA: Diagnosis not present

## 2021-05-13 DIAGNOSIS — R0602 Shortness of breath: Secondary | ICD-10-CM | POA: Diagnosis not present

## 2021-05-13 DIAGNOSIS — F339 Major depressive disorder, recurrent, unspecified: Secondary | ICD-10-CM

## 2021-05-13 DIAGNOSIS — M25531 Pain in right wrist: Secondary | ICD-10-CM | POA: Diagnosis not present

## 2021-05-13 DIAGNOSIS — M79641 Pain in right hand: Secondary | ICD-10-CM | POA: Diagnosis not present

## 2021-05-13 MED ORDER — LOSARTAN POTASSIUM 25 MG PO TABS: 25.0000 mg | ORAL_TABLET | Freq: Every day | ORAL | 0 refills | Status: DC

## 2021-05-13 NOTE — Assessment & Plan Note (Signed)
Still struggling with shortness of breath.  I do think some of this could be related to her throat issues.  She has still had some intermittent choking and just saw the specialist for this.

## 2021-05-13 NOTE — Assessment & Plan Note (Signed)
Did encourage her to call herself to get scheduled with behavioral health I think if she is having trouble getting them to respond back then to let us know but I would really encourage her to be persistent with it.

## 2021-05-13 NOTE — Progress Notes (Signed)
Virtual Visit via Video Note  I connected with Jennifer [name] on 05/13/21 at  1:40 PM EDT by a video enabled telemedicine application and verified that I am speaking with the correct person using two identifiers.   I discussed the limitations of evaluation and management by telemedicine and the availability of in person appointments. The patient expressed understanding and agreed to proceed.  Patient location: at home Provider location: in office  Subjective:    CC: SOB  HPI: She has been moe SOB. She has used her inhaler.   Talked to Dr. Rowe Clack about her swallowing problems and coughing after eating.  She is going to be scheduled for a timed barium swallowing. Feels like it is in the muscle.  Yesterday choked again on food.    HTN - BP has been high. Wasn't able to do her stress test bc.  She is only taking her metoprolol 25mg  bid.  The cardiologist doesn't want her on the hydralazine bc of SVT.    Has injured her hand while getting mad and pounding her right fist. Pain is mostly around the lateral wrist.  Can have pain with movement and then will having some grip pain.   Masses on her back are starting to get more problematic.  Pain radiates when you push on the area.    She is still trying to get into [employer].    Notices a yeasty smell. Wet prep performed in July.  It was normal at that time.  She does wear pads for urinary incontinence.    Past medical history, Surgical history, Family history not pertinant except as noted below, Social history, Allergies, and medications have been entered into the medical record, reviewed, and corrections made.    Objective:    General: Speaking clearly in complete sentences without any shortness of breath.  Alert and oriented x3.  Normal judgment. No apparent acute distress.    Impression and Recommendations:    Hypertension with intolerance to multiple antihypertensive drugs Please increase metoprolol to 2 tabs at bedtime  and 1 in AM. She is willing to retry losartan. Ok to start 12/5 mg bid.    SOB (shortness of breath) Still struggling with shortness of breath.  I do think some of this could be related to her throat issues.  She has still had some intermittent choking and just saw the specialist for this.  Depression, recurrent ([place]) Did encourage her to call herself to get scheduled with behavioral health I think if she is having trouble getting them to respond back then to let us know but I would really encourage her to be persistent with it.  Nasal congestion  - is till there and chronic so recommend f/u with ENT in [place].  She wonders if may need some further imaging.   Right hand and wrist pain after injury-x-ray ordered we will fax this to [name] so she can go next week.  If no sign of fracture and not improving then recommend she follow-up with sports medicine.  Orders Placed This Encounter  Procedures   DG Wrist Complete Right    Standing Status:   Future    Standing Expiration Date:   05/13/2022    Order Specific Question:   Reason for Exam (SYMPTOM  OR DIAGNOSIS REQUIRED)    Answer:   right lateral wirst pain and pain over the 5th [facility] bone    Order Specific Question:   Is patient pregnant?    Answer:  No    Order Specific Question:   Preferred imaging location?    Answer:   [facility]   DG Hand Complete Right    Standing Status:   Future    Standing Expiration Date:   05/13/2022    Order Specific Question:   Reason for Exam (SYMPTOM  OR DIAGNOSIS REQUIRED)    Answer:   right lateral wirst pain and pain over the 5th [facility] bone    Order Specific Question:   Is patient pregnant?    Answer:   No    Order Specific Question:   Preferred imaging location?    Answer:   [facility]    Meds ordered this encounter  Medications   losartan (COZAAR) 25 MG tablet    Sig: Take 1 tablet (25 mg total) by mouth daily.    Dispense:  30 tablet    Refill:  0    I discussed the  assessment and treatment plan with the patient. The patient was provided an opportunity to ask questions and all were answered. The patient agreed with the plan and demonstrated an understanding of the instructions.   The patient was advised to call back or seek an in-person evaluation if the symptoms worsen or if the condition fails to improve as anticipated. I spent 40 minutes on the day of the encounter to include pre-visit record review, face-to-face time with the patient and post visit ordering of test.   Beatrice Lecher, MD

## 2021-05-13 NOTE — Assessment & Plan Note (Addendum)
Please increase metoprolol to 2 tabs at bedtime and 1 in AM. She is willing to retry losartan. Ok to start 12/5 mg bid.

## 2021-05-16 ENCOUNTER — Telehealth: Payer: Self-pay | Admitting: Family Medicine

## 2021-05-16 ENCOUNTER — Other Ambulatory Visit: Payer: Self-pay

## 2021-05-16 DIAGNOSIS — I10 Essential (primary) hypertension: Secondary | ICD-10-CM

## 2021-05-16 MED ORDER — LOSARTAN POTASSIUM 25 MG PO TABS: 25.0000 mg | ORAL_TABLET | Freq: Every day | ORAL | 0 refills | Status: DC

## 2021-05-16 NOTE — Telephone Encounter (Signed)
Pt called to follow up on Order for her hand pain.  Thank you.

## 2021-05-16 NOTE — Telephone Encounter (Signed)
Jennifer [name] had placed the orders for med [facility] because I thought that is where she wanted to go but I think it was a misunderstanding I think she wanted to go to [facility] we should just be able to print them and fax them there so she can get those done at her convenience this week.

## 2021-05-17 ENCOUNTER — Telehealth: Payer: Self-pay | Admitting: Family

## 2021-05-17 DIAGNOSIS — I10 Essential (primary) hypertension: Secondary | ICD-10-CM | POA: Diagnosis not present

## 2021-05-17 DIAGNOSIS — M25531 Pain in right wrist: Secondary | ICD-10-CM | POA: Diagnosis not present

## 2021-05-17 DIAGNOSIS — M79641 Pain in right hand: Secondary | ICD-10-CM | POA: Diagnosis not present

## 2021-05-17 DIAGNOSIS — R21 Rash and other nonspecific skin eruption: Secondary | ICD-10-CM | POA: Diagnosis not present

## 2021-05-17 DIAGNOSIS — S6991XA Unspecified injury of right wrist, hand and finger(s), initial encounter: Secondary | ICD-10-CM | POA: Diagnosis not present

## 2021-05-17 NOTE — Telephone Encounter (Signed)
Ok to refill EpiPen 0.3 mg. Make sure that Jennifer [name] keeps her follow up appointment with Dr. Simona Huh on 05/26/21 @ 11 AM.

## 2021-05-17 NOTE — Telephone Encounter (Signed)
Order faxed to [facility] on 05/17/2021 and patient was contacted so she can have x-ray completed.

## 2021-05-17 NOTE — Telephone Encounter (Signed)
The patient saw the report from the x-ray she took yesterday and is still complaining about loss of grip strength in her right hand. She thought something would be broken because of pain and symptom she is experiencing. Patient does not want to take motrin for the pain.  Please let me know of other things the patient can try for relief of if further evaluation is needed.

## 2021-05-17 NOTE — Telephone Encounter (Signed)
Pt request refill for Epi-pen

## 2021-05-18 ENCOUNTER — Telehealth: Payer: Self-pay | Admitting: Family Medicine

## 2021-05-18 DIAGNOSIS — G4733 Obstructive sleep apnea (adult) (pediatric): Secondary | ICD-10-CM | POA: Diagnosis not present

## 2021-05-18 DIAGNOSIS — R519 Headache, unspecified: Secondary | ICD-10-CM | POA: Diagnosis not present

## 2021-05-18 DIAGNOSIS — R0683 Snoring: Secondary | ICD-10-CM | POA: Diagnosis not present

## 2021-05-18 DIAGNOSIS — H532 Diplopia: Secondary | ICD-10-CM | POA: Diagnosis not present

## 2021-05-18 MED ORDER — EPINEPHRINE 0.3 MG/0.3ML IJ SOAJ: 0.3000 mg | Freq: Once | INTRAMUSCULAR | 0 refills | Status: AC

## 2021-05-18 NOTE — Telephone Encounter (Signed)
Needs OV around 11/4. Thank you

## 2021-05-18 NOTE — Telephone Encounter (Signed)
Spoke with pt and she had the x-rays done yesterday (reports can be found in [place]) and the reports result as "Negative". Pt states she does not trust x-rays because there is usually something worse going on that the x-rays are not picking up. Pt states she does not feel Ortho would be the avenue to go to for help with this. Pt is wanting to know what to do now.

## 2021-05-18 NOTE — Telephone Encounter (Signed)
Recommend thumb spica splint for 2 weeks and then we can see if it is improving if not then again I recommend either Ortho or sports med if we feel like it is a more significant tendon injury

## 2021-05-18 NOTE — Telephone Encounter (Signed)
She feels like it is getting more weak then we should probably get her in with sports medicine after the x-ray think we should start with the x-ray just to make sure that there is no underlying fracture and if that is normal then getting her into look more at the soft tissues.  In the short-term if she has a wrist cock up splint like would typically be used for carpal tunnel she could certainly put that on to provide a little extra support until she is able to get for the x-ray.  We faxed over the request yesterday so she should be able to get those done today if she has a chance.

## 2021-05-19 DIAGNOSIS — J029 Acute pharyngitis, unspecified: Secondary | ICD-10-CM | POA: Diagnosis not present

## 2021-05-19 DIAGNOSIS — G8929 Other chronic pain: Secondary | ICD-10-CM | POA: Diagnosis not present

## 2021-05-19 DIAGNOSIS — R051 Acute cough: Secondary | ICD-10-CM | POA: Diagnosis not present

## 2021-05-19 DIAGNOSIS — F419 Anxiety disorder, unspecified: Secondary | ICD-10-CM | POA: Diagnosis not present

## 2021-05-19 DIAGNOSIS — R1310 Dysphagia, unspecified: Secondary | ICD-10-CM | POA: Diagnosis not present

## 2021-05-19 DIAGNOSIS — K2270 Barrett's esophagus without dysplasia: Secondary | ICD-10-CM | POA: Diagnosis not present

## 2021-05-19 DIAGNOSIS — Z8542 Personal history of malignant neoplasm of other parts of uterus: Secondary | ICD-10-CM | POA: Diagnosis not present

## 2021-05-19 DIAGNOSIS — L729 Follicular cyst of the skin and subcutaneous tissue, unspecified: Secondary | ICD-10-CM | POA: Diagnosis not present

## 2021-05-19 DIAGNOSIS — R0602 Shortness of breath: Secondary | ICD-10-CM | POA: Diagnosis not present

## 2021-05-19 DIAGNOSIS — Z20822 Contact with and (suspected) exposure to covid-19: Secondary | ICD-10-CM | POA: Diagnosis not present

## 2021-05-19 DIAGNOSIS — J45909 Unspecified asthma, uncomplicated: Secondary | ICD-10-CM | POA: Diagnosis not present

## 2021-05-19 DIAGNOSIS — E119 Type 2 diabetes mellitus without complications: Secondary | ICD-10-CM | POA: Diagnosis not present

## 2021-05-20 DIAGNOSIS — L729 Follicular cyst of the skin and subcutaneous tissue, unspecified: Secondary | ICD-10-CM | POA: Diagnosis not present

## 2021-05-20 DIAGNOSIS — R0602 Shortness of breath: Secondary | ICD-10-CM | POA: Diagnosis not present

## 2021-05-20 DIAGNOSIS — R051 Acute cough: Secondary | ICD-10-CM | POA: Diagnosis not present

## 2021-05-23 DIAGNOSIS — R0602 Shortness of breath: Secondary | ICD-10-CM | POA: Diagnosis not present
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
## 2021-05-24 DIAGNOSIS — I10 Essential (primary) hypertension: Secondary | ICD-10-CM | POA: Diagnosis not present

## 2021-05-24 DIAGNOSIS — R0682 Tachypnea, not elsewhere classified: Secondary | ICD-10-CM | POA: Diagnosis not present

## 2021-05-24 DIAGNOSIS — R0789 Other chest pain: Secondary | ICD-10-CM | POA: Diagnosis not present

## 2021-05-24 DIAGNOSIS — D500 Iron deficiency anemia secondary to blood loss (chronic): Secondary | ICD-10-CM | POA: Diagnosis not present

## 2021-05-24 DIAGNOSIS — R0602 Shortness of breath: Secondary | ICD-10-CM | POA: Diagnosis not present

## 2021-05-24 DIAGNOSIS — R9431 Abnormal electrocardiogram [ECG] [EKG]: Secondary | ICD-10-CM | POA: Diagnosis not present

## 2021-05-26 ENCOUNTER — Other Ambulatory Visit: Payer: Self-pay

## 2021-05-26 ENCOUNTER — Ambulatory Visit (INDEPENDENT_AMBULATORY_CARE_PROVIDER_SITE_OTHER): Payer: Medicare HMO | Admitting: Internal Medicine

## 2021-05-26 ENCOUNTER — Encounter: Payer: Self-pay | Admitting: Internal Medicine

## 2021-05-26 ENCOUNTER — Ambulatory Visit: Payer: Medicare HMO | Admitting: Family Medicine

## 2021-05-26 ENCOUNTER — Ambulatory Visit: Payer: Medicare HMO | Admitting: Internal Medicine

## 2021-05-26 VITALS — BP 150/90 | HR 79 | Temp 97.9°F | Resp 16 | Ht 62.0 in | Wt 225.2 lb

## 2021-05-26 DIAGNOSIS — R0602 Shortness of breath: Secondary | ICD-10-CM

## 2021-05-26 DIAGNOSIS — J3089 Other allergic rhinitis: Secondary | ICD-10-CM | POA: Diagnosis not present

## 2021-05-26 DIAGNOSIS — K219 Gastro-esophageal reflux disease without esophagitis: Secondary | ICD-10-CM

## 2021-05-26 NOTE — Patient Instructions (Signed)
1. Moderate persistent asthma, uncomplicated - your lung testing today looked good - Continue Xopenex hfa 2 puffs every 6 hours as needed for cough, wheeze,tightness in chest, or shortness of breath OR Xopenex 1.25 mg 1 unit dose via nebulizer every 6 hours as needed - Continue to follow up with [facility]mon[facility].  - Continue to use aerobika valve 1-2 times a day for 5-10 minutes as recommended by  [facility] Pulmonology  2. Chronic rhinitis - Continue with nasal saline rinses. -Try Cristino Martes ER 4 mg every 8 hours as needed.  Let us know if this works, and we will order you some. If it does not, continue Benadryl 25 mg every 4 to 6 hours as needed. -Continue mometasone nasal spray 1-2 sprays each nostril once a day as needed for stuffy nose - we will update your allergy test via blood today  3. Reflux -Continue to follow up with GI   Please let us know if this treatment plan is not working well for you. Schedule a follow up appointment in 6 months.

## 2021-05-27 ENCOUNTER — Encounter: Payer: Self-pay | Admitting: Family Medicine

## 2021-05-27 ENCOUNTER — Ambulatory Visit (INDEPENDENT_AMBULATORY_CARE_PROVIDER_SITE_OTHER): Payer: Medicare HMO | Admitting: Family Medicine

## 2021-05-27 VITALS — BP 141/76 | HR 90 | Ht 63.0 in | Wt 226.0 lb

## 2021-05-27 DIAGNOSIS — J3089 Other allergic rhinitis: Secondary | ICD-10-CM | POA: Diagnosis not present

## 2021-05-27 DIAGNOSIS — K224 Dyskinesia of esophagus: Secondary | ICD-10-CM | POA: Diagnosis not present

## 2021-05-27 DIAGNOSIS — R4702 Dysphasia: Secondary | ICD-10-CM | POA: Diagnosis not present

## 2021-05-27 DIAGNOSIS — R1310 Dysphagia, unspecified: Secondary | ICD-10-CM | POA: Diagnosis not present

## 2021-05-27 DIAGNOSIS — D510 Vitamin B12 deficiency anemia due to intrinsic factor deficiency: Secondary | ICD-10-CM | POA: Diagnosis not present

## 2021-05-27 DIAGNOSIS — R222 Localized swelling, mass and lump, trunk: Secondary | ICD-10-CM

## 2021-05-27 DIAGNOSIS — I10 Essential (primary) hypertension: Secondary | ICD-10-CM | POA: Diagnosis not present

## 2021-05-27 DIAGNOSIS — J4540 Moderate persistent asthma, uncomplicated: Secondary | ICD-10-CM | POA: Diagnosis not present

## 2021-05-27 DIAGNOSIS — J4520 Mild intermittent asthma, uncomplicated: Secondary | ICD-10-CM | POA: Diagnosis not present

## 2021-05-27 MED ORDER — CYANOCOBALAMIN 1000 MCG/ML IJ SOLN
1000.0000 ug | Freq: Once | INTRAMUSCULAR | Status: AC
  Administered 2021-05-27: 1000 ug via INTRAMUSCULAR

## 2021-05-27 MED ORDER — FLUNISOLIDE 25 MCG/ACT (0.025%) NA SOLN: 2.0000 | Freq: Two times a day (BID) | NASAL | 2 refills | Status: DC

## 2021-05-27 NOTE — Addendum Note (Signed)
Addended by: Teddy Spike on: 05/27/2021 06:08 PM   Modules accepted: Orders
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
## 2021-05-27 NOTE — Patient Instructions (Signed)
We will start going the B12 montly instead of every 2 weeks.

## 2021-05-27 NOTE — Assessment & Plan Note (Addendum)
05/26/21: FVC was 71%.  Her FEV1 was 79%.  Ratio of 0.8 - follows with Asthma and Allergy

## 2021-05-27 NOTE — Assessment & Plan Note (Signed)
Currently only taking metoprolol she has not started the losartan because she felt like her blood pressures were starting to look a little better.  We did discuss though that the goal is to get systolic pressure under 250, and diastolic pressure under 85.  So I think she still needs to start at least a half a tab of the losartan and if she tolerates well after 2 weeks okay to increase.  She is worried that her blood pressure might drop too low.

## 2021-05-27 NOTE — Assessment & Plan Note (Signed)
Did look at the side effects of the samples and they can potentially cause tachycardia or palpitations.  She would rather try an nasal spray instead she does not really like the Flonase.  It looks like flunisolide is covered on her insurance plan so we will send that over.

## 2021-05-27 NOTE — Assessment & Plan Note (Signed)
B12 given today.  Because level was at goal we will plan to start spacing the B12 to monthly and plan to recheck again in 3 to 4 months.

## 2021-05-27 NOTE — Progress Notes (Signed)
Pt was given a sample of [place] ER from the Asthma/Allergist and wanted to know Dr. Gardiner Ramus thoughts about this.  Her BP has been running somewhat lower that it has been for the past 1.5 wks

## 2021-05-29 DIAGNOSIS — J3489 Other specified disorders of nose and nasal sinuses: Secondary | ICD-10-CM | POA: Diagnosis not present

## 2021-05-29 DIAGNOSIS — R519 Headache, unspecified: Secondary | ICD-10-CM | POA: Diagnosis not present

## 2021-05-29 DIAGNOSIS — I10 Essential (primary) hypertension: Secondary | ICD-10-CM | POA: Diagnosis not present

## 2021-05-29 DIAGNOSIS — Z87891 Personal history of nicotine dependence: Secondary | ICD-10-CM | POA: Diagnosis not present

## 2021-05-30 ENCOUNTER — Telehealth: Payer: Self-pay | Admitting: Family Medicine

## 2021-05-30 DIAGNOSIS — R519 Headache, unspecified: Secondary | ICD-10-CM | POA: Diagnosis not present

## 2021-05-30 DIAGNOSIS — R7303 Prediabetes: Secondary | ICD-10-CM

## 2021-05-30 DIAGNOSIS — I6621 Occlusion and stenosis of right posterior cerebral artery: Secondary | ICD-10-CM | POA: Diagnosis not present

## 2021-05-30 DIAGNOSIS — I7789 Other specified disorders of arteries and arterioles: Secondary | ICD-10-CM | POA: Diagnosis not present

## 2021-05-30 NOTE — Telephone Encounter (Signed)
Ok to send

## 2021-05-30 NOTE — Telephone Encounter (Signed)
PT states that Dr.Metheney was gpomg to call in Diabetic testing machine and supplies so she can check her Blood Sugars and she states its not at her pharmacy.

## 2021-05-31 ENCOUNTER — Telehealth: Payer: Self-pay

## 2021-05-31 ENCOUNTER — Other Ambulatory Visit: Payer: Self-pay | Admitting: *Deleted

## 2021-05-31 ENCOUNTER — Telehealth: Payer: Self-pay | Admitting: *Deleted

## 2021-05-31 DIAGNOSIS — G8929 Other chronic pain: Secondary | ICD-10-CM

## 2021-05-31 DIAGNOSIS — R222 Localized swelling, mass and lump, trunk: Secondary | ICD-10-CM

## 2021-05-31 DIAGNOSIS — M79641 Pain in right hand: Secondary | ICD-10-CM

## 2021-05-31 DIAGNOSIS — R9431 Abnormal electrocardiogram [ECG] [EKG]: Secondary | ICD-10-CM | POA: Diagnosis not present

## 2021-05-31 DIAGNOSIS — M25511 Pain in right shoulder: Secondary | ICD-10-CM

## 2021-05-31 MED ORDER — BLOOD GLUCOSE METER KIT: PACK | 0 refills | Status: DC

## 2021-05-31 NOTE — Telephone Encounter (Signed)
Jennifer [name] states she is still having right hand pain. She states the brace hurts. She would like a different brace. Please advise.

## 2021-05-31 NOTE — Telephone Encounter (Signed)
Pt requests to be seen at [facility], but does not want to see Dr. Raeford Razor again.  Referral placed.  Charyl Bigger, CMA

## 2021-05-31 NOTE — Progress Notes (Signed)
error 

## 2021-05-31 NOTE — Telephone Encounter (Signed)
Imaging order changed per request of the imaging dept at [facility].  New order faxed to [phone].

## 2021-05-31 NOTE — Telephone Encounter (Signed)
No, needs to see sports med if her hand is still hurting.

## 2021-05-31 NOTE — Telephone Encounter (Signed)
Order placed

## 2021-05-31 NOTE — Addendum Note (Signed)
Addended by: Teddy Spike on: 05/31/2021 05:10 PM   Modules accepted: Orders

## 2021-06-01 DIAGNOSIS — M19011 Primary osteoarthritis, right shoulder: Secondary | ICD-10-CM | POA: Diagnosis not present

## 2021-06-01 DIAGNOSIS — M25511 Pain in right shoulder: Secondary | ICD-10-CM | POA: Diagnosis not present

## 2021-06-01 DIAGNOSIS — M7989 Other specified soft tissue disorders: Secondary | ICD-10-CM | POA: Diagnosis not present

## 2021-06-01 DIAGNOSIS — Z889 Allergy status to unspecified drugs, medicaments and biological substances status: Secondary | ICD-10-CM | POA: Diagnosis not present

## 2021-06-02 ENCOUNTER — Emergency Department (HOSPITAL_BASED_OUTPATIENT_CLINIC_OR_DEPARTMENT_OTHER)
Admission: EM | Admit: 2021-06-02 | Discharge: 2021-06-02 | Disposition: A | Discharge status: Home / Self Care | Payer: Medicare HMO | Attending: Emergency Medicine | Admitting: Emergency Medicine

## 2021-06-02 ENCOUNTER — Encounter (HOSPITAL_BASED_OUTPATIENT_CLINIC_OR_DEPARTMENT_OTHER): Payer: Self-pay | Admitting: Urology

## 2021-06-02 ENCOUNTER — Emergency Department (HOSPITAL_BASED_OUTPATIENT_CLINIC_OR_DEPARTMENT_OTHER): Payer: Medicare HMO

## 2021-06-02 ENCOUNTER — Other Ambulatory Visit: Payer: Self-pay

## 2021-06-02 DIAGNOSIS — M79601 Pain in right arm: Secondary | ICD-10-CM | POA: Insufficient documentation

## 2021-06-02 DIAGNOSIS — I10 Essential (primary) hypertension: Secondary | ICD-10-CM | POA: Diagnosis not present

## 2021-06-02 DIAGNOSIS — M25519 Pain in unspecified shoulder: Secondary | ICD-10-CM | POA: Diagnosis present

## 2021-06-02 NOTE — ED Notes (Signed)
ED Provider at bedside. 

## 2021-06-02 NOTE — ED Notes (Signed)
Patient transported to Ultrasound 
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
## 2021-06-02 NOTE — ED Notes (Signed)
Attempt to place pt in gown for cardiac monitor. Pt refused states she is allergic to the gowns here.

## 2021-06-02 NOTE — ED Triage Notes (Signed)
Right shoulder pain x 3 days, with swelling in axilla per patient.  Limited ROM r/t pain,  recent fall in past month

## 2021-06-03 DIAGNOSIS — R222 Localized swelling, mass and lump, trunk: Secondary | ICD-10-CM | POA: Diagnosis not present

## 2021-06-04 IMAGING — DX DG CHEST 1V PORT
1 series · 1 of 1 positions shown · non-contrast
Comparison: 03/09/2020

CLINICAL DATA: Chest pain, chronic cough

EXAM:
PORTABLE CHEST 1 VIEW

[chest ap]
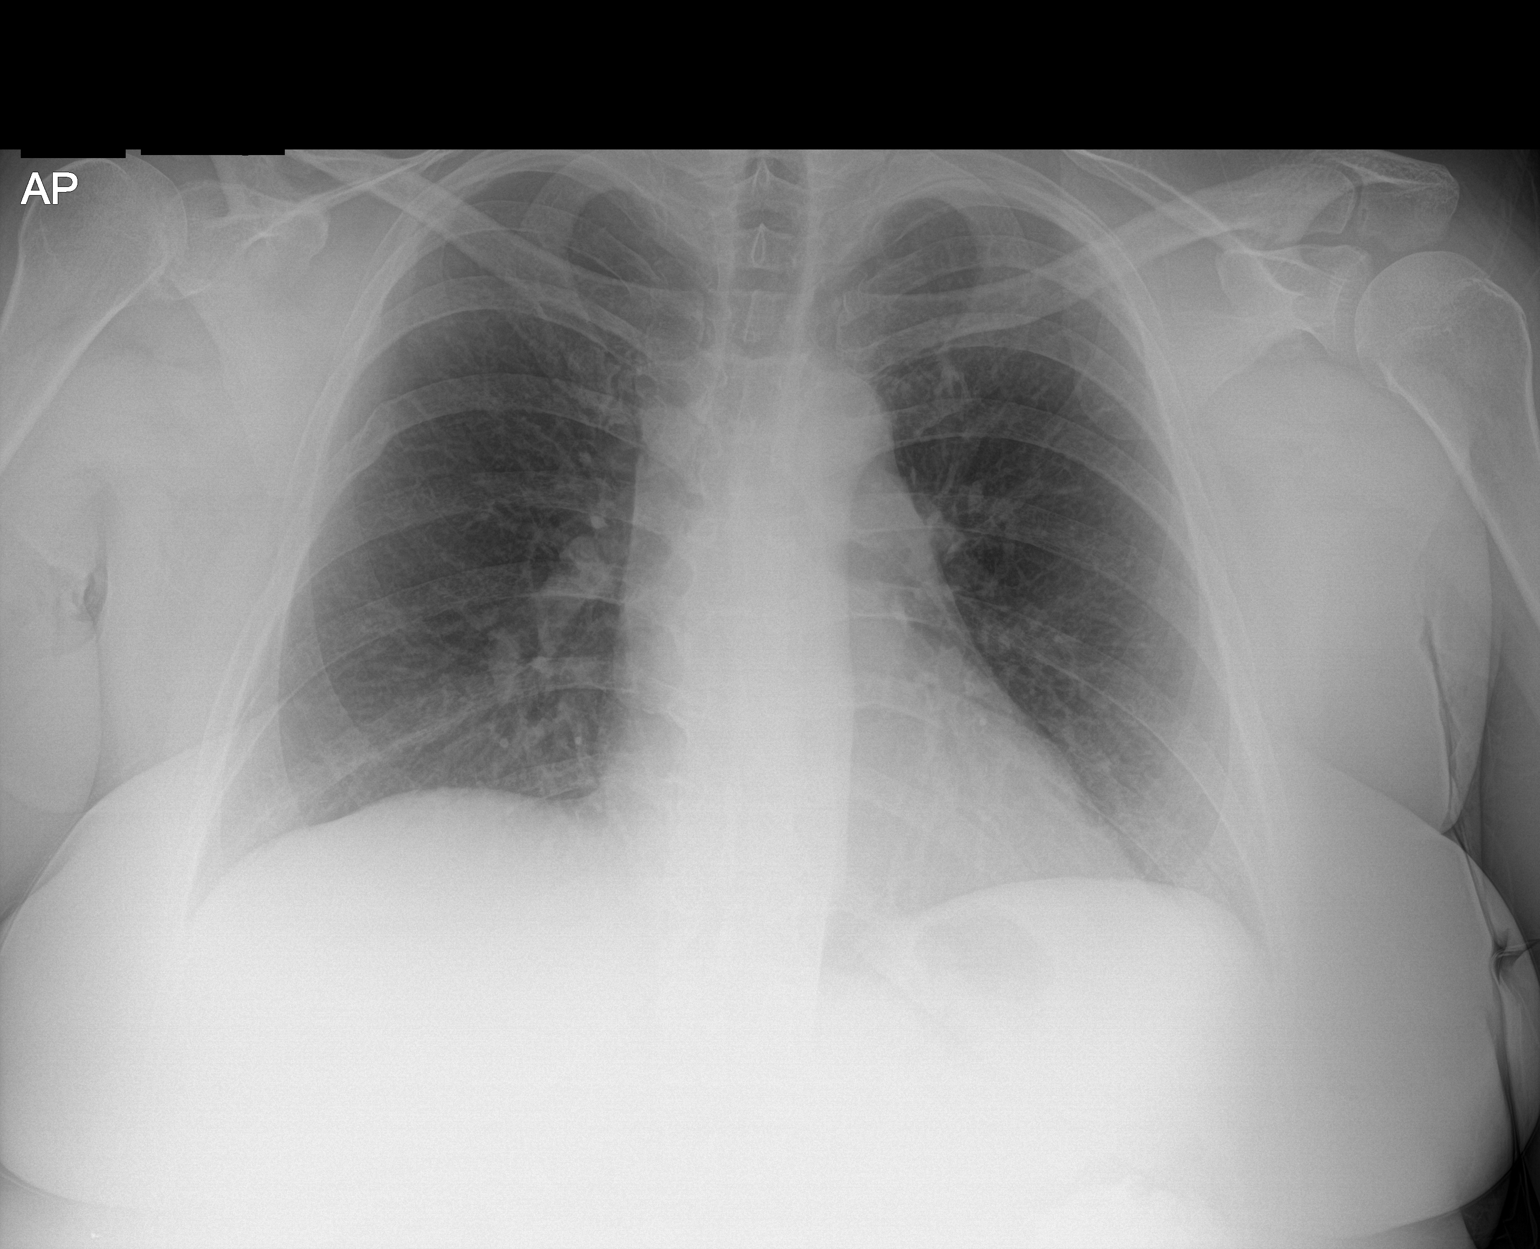

[1 of 1 positions shown; findings below may reference images not displayed]

FINDINGS: The heart size and mediastinal contours are within normal limits. No
focal airspace consolidation, pleural effusion, or pneumothorax.
Remote healed posterior right sixth rib fracture. Osseous structures
appear otherwise within normal limits.
IMPRESSION: No active disease.

## 2021-06-07 ENCOUNTER — Telehealth: Payer: Self-pay | Admitting: Family Medicine

## 2021-06-07 DIAGNOSIS — R930 Abnormal findings on diagnostic imaging of skull and head, not elsewhere classified: Secondary | ICD-10-CM | POA: Diagnosis not present

## 2021-06-07 DIAGNOSIS — H538 Other visual disturbances: Secondary | ICD-10-CM | POA: Diagnosis not present

## 2021-06-07 DIAGNOSIS — R110 Nausea: Secondary | ICD-10-CM | POA: Diagnosis not present

## 2021-06-07 DIAGNOSIS — Z87891 Personal history of nicotine dependence: Secondary | ICD-10-CM | POA: Diagnosis not present

## 2021-06-07 DIAGNOSIS — I10 Essential (primary) hypertension: Secondary | ICD-10-CM | POA: Diagnosis not present

## 2021-06-07 DIAGNOSIS — R519 Headache, unspecified: Secondary | ICD-10-CM

## 2021-06-07 DIAGNOSIS — F419 Anxiety disorder, unspecified: Secondary | ICD-10-CM | POA: Diagnosis not present

## 2021-06-07 DIAGNOSIS — E611 Iron deficiency: Secondary | ICD-10-CM | POA: Diagnosis not present

## 2021-06-07 DIAGNOSIS — J3489 Other specified disorders of nose and nasal sinuses: Secondary | ICD-10-CM | POA: Diagnosis not present

## 2021-06-07 DIAGNOSIS — R9389 Abnormal findings on diagnostic imaging of other specified body structures: Secondary | ICD-10-CM

## 2021-06-07 DIAGNOSIS — D649 Anemia, unspecified: Secondary | ICD-10-CM | POA: Diagnosis not present

## 2021-06-07 LAB — BASIC METABOLIC PANEL
BUN: 15 (ref 4–21)
CO2: 25 — AB (ref 13–22)
Chloride: 108 (ref 99–108)
Creatinine: 0.6 (ref 0.5–1.1)
Glucose: 138
Potassium: 3.8 (ref 3.4–5.3)
Sodium: 140 (ref 137–147)

## 2021-06-07 LAB — COMPREHENSIVE METABOLIC PANEL: Calcium: 9.1 (ref 8.7–10.7)

## 2021-06-07 NOTE — Telephone Encounter (Signed)
Pt called and is requesting Dr.Metheney to put in a Urgent Referral to [facility] located in [place] at [place], [place]  and their phone is [phone] and Fax is [phone]. She states that if this gets put in and sent over urgent - they can get her in sometime in September. She is wanting this referral based on a image she had performed on 05/30/21.

## 2021-06-07 NOTE — Telephone Encounter (Signed)
Order started and pended. Will route to pcp for review.   The imaging was done at [facility] for frequent headaches.

## 2021-06-07 NOTE — Telephone Encounter (Signed)
Ref placed. This is not urgent

## 2021-06-08 DIAGNOSIS — R519 Headache, unspecified: Secondary | ICD-10-CM | POA: Diagnosis not present

## 2021-06-08 DIAGNOSIS — H538 Other visual disturbances: Secondary | ICD-10-CM | POA: Diagnosis not present

## 2021-06-09 ENCOUNTER — Ambulatory Visit: Payer: Medicare HMO | Admitting: Family Medicine

## 2021-06-09 ENCOUNTER — Encounter: Payer: Self-pay | Admitting: Family Medicine

## 2021-06-11 DIAGNOSIS — R0981 Nasal congestion: Secondary | ICD-10-CM | POA: Diagnosis not present

## 2021-06-11 DIAGNOSIS — R42 Dizziness and giddiness: Secondary | ICD-10-CM | POA: Diagnosis not present

## 2021-06-11 DIAGNOSIS — R0600 Dyspnea, unspecified: Secondary | ICD-10-CM | POA: Diagnosis not present

## 2021-06-15 DIAGNOSIS — R0689 Other abnormalities of breathing: Secondary | ICD-10-CM | POA: Diagnosis not present

## 2021-06-15 DIAGNOSIS — Y999 Unspecified external cause status: Secondary | ICD-10-CM | POA: Diagnosis not present

## 2021-06-15 DIAGNOSIS — R609 Edema, unspecified: Secondary | ICD-10-CM | POA: Diagnosis not present

## 2021-06-15 DIAGNOSIS — I10 Essential (primary) hypertension: Secondary | ICD-10-CM | POA: Diagnosis not present

## 2021-06-15 DIAGNOSIS — S0990XA Unspecified injury of head, initial encounter: Secondary | ICD-10-CM | POA: Diagnosis not present

## 2021-06-15 DIAGNOSIS — W208XXA Other cause of strike by thrown, projected or falling object, initial encounter: Secondary | ICD-10-CM | POA: Diagnosis not present

## 2021-06-15 DIAGNOSIS — W19XXXA Unspecified fall, initial encounter: Secondary | ICD-10-CM | POA: Diagnosis not present

## 2021-06-15 DIAGNOSIS — S060X0A Concussion without loss of consciousness, initial encounter: Secondary | ICD-10-CM | POA: Diagnosis not present

## 2021-06-16 ENCOUNTER — Telehealth: Payer: Self-pay | Admitting: Family Medicine

## 2021-06-16 DIAGNOSIS — R079 Chest pain, unspecified: Secondary | ICD-10-CM | POA: Diagnosis not present

## 2021-06-16 DIAGNOSIS — Z7951 Long term (current) use of inhaled steroids: Secondary | ICD-10-CM | POA: Diagnosis not present

## 2021-06-16 DIAGNOSIS — Z881 Allergy status to other antibiotic agents status: Secondary | ICD-10-CM | POA: Diagnosis not present

## 2021-06-16 DIAGNOSIS — Z888 Allergy status to other drugs, medicaments and biological substances status: Secondary | ICD-10-CM | POA: Diagnosis not present

## 2021-06-16 DIAGNOSIS — D171 Benign lipomatous neoplasm of skin and subcutaneous tissue of trunk: Secondary | ICD-10-CM

## 2021-06-16 DIAGNOSIS — Z885 Allergy status to narcotic agent status: Secondary | ICD-10-CM | POA: Diagnosis not present

## 2021-06-16 DIAGNOSIS — S199XXA Unspecified injury of neck, initial encounter: Secondary | ICD-10-CM | POA: Diagnosis not present

## 2021-06-16 DIAGNOSIS — S0990XA Unspecified injury of head, initial encounter: Secondary | ICD-10-CM | POA: Diagnosis not present

## 2021-06-16 DIAGNOSIS — Z8249 Family history of ischemic heart disease and other diseases of the circulatory system: Secondary | ICD-10-CM | POA: Diagnosis not present

## 2021-06-16 DIAGNOSIS — Z9851 Tubal ligation status: Secondary | ICD-10-CM | POA: Diagnosis not present

## 2021-06-16 DIAGNOSIS — G8911 Acute pain due to trauma: Secondary | ICD-10-CM | POA: Diagnosis not present

## 2021-06-16 DIAGNOSIS — Z882 Allergy status to sulfonamides status: Secondary | ICD-10-CM | POA: Diagnosis not present

## 2021-06-16 DIAGNOSIS — I471 Supraventricular tachycardia: Secondary | ICD-10-CM | POA: Diagnosis not present

## 2021-06-16 DIAGNOSIS — I10 Essential (primary) hypertension: Secondary | ICD-10-CM | POA: Diagnosis not present

## 2021-06-16 DIAGNOSIS — Z880 Allergy status to penicillin: Secondary | ICD-10-CM | POA: Diagnosis not present

## 2021-06-16 DIAGNOSIS — Z95818 Presence of other cardiac implants and grafts: Secondary | ICD-10-CM | POA: Diagnosis not present

## 2021-06-16 DIAGNOSIS — Z79899 Other long term (current) drug therapy: Secondary | ICD-10-CM | POA: Diagnosis not present

## 2021-06-16 NOTE — Telephone Encounter (Signed)
Pt: Jennifer [name] of back showed lumps are most consistent with lipomas which are benign fatty growths. They are not harmful but if are bothersome they can be removed

## 2021-06-16 NOTE — Telephone Encounter (Signed)
Called patient and went over results. She states that she would like to have these removed as they are painful. Offered to set up consult with Dr. Darene Lamer.  Patient states Dr. Darene Lamer will not see her and asked for other option.

## 2021-06-17 ENCOUNTER — Encounter: Payer: Self-pay | Admitting: Family Medicine

## 2021-06-17 ENCOUNTER — Ambulatory Visit (INDEPENDENT_AMBULATORY_CARE_PROVIDER_SITE_OTHER): Payer: Medicare HMO | Admitting: Family Medicine

## 2021-06-17 VITALS — BP 150/77 | HR 85 | Ht 63.0 in | Wt 228.3 lb

## 2021-06-17 DIAGNOSIS — R7303 Prediabetes: Secondary | ICD-10-CM

## 2021-06-17 DIAGNOSIS — R079 Chest pain, unspecified: Secondary | ICD-10-CM | POA: Diagnosis not present

## 2021-06-17 DIAGNOSIS — G8929 Other chronic pain: Secondary | ICD-10-CM | POA: Diagnosis not present

## 2021-06-17 DIAGNOSIS — R9431 Abnormal electrocardiogram [ECG] [EKG]: Secondary | ICD-10-CM | POA: Diagnosis not present

## 2021-06-17 DIAGNOSIS — R0789 Other chest pain: Secondary | ICD-10-CM | POA: Diagnosis not present

## 2021-06-17 DIAGNOSIS — R7301 Impaired fasting glucose: Secondary | ICD-10-CM

## 2021-06-17 DIAGNOSIS — S0990XA Unspecified injury of head, initial encounter: Secondary | ICD-10-CM

## 2021-06-17 DIAGNOSIS — M79602 Pain in left arm: Secondary | ICD-10-CM | POA: Diagnosis not present

## 2021-06-17 DIAGNOSIS — I10 Essential (primary) hypertension: Secondary | ICD-10-CM | POA: Diagnosis not present

## 2021-06-17 LAB — GLUCOSE, POCT (MANUAL RESULT ENTRY): POC Glucose: 137 mg/dl — AB (ref 70–99)

## 2021-06-17 MED ORDER — ACCU-CHEK SOFTCLIX LANCETS MISC: 11 refills | Status: DC

## 2021-06-17 MED ORDER — ACCU-CHEK GUIDE VI STRP: ORAL_STRIP | 11 refills | Status: DC

## 2021-06-17 NOTE — Telephone Encounter (Addendum)
Will discuss w/pt at [facility] today.   Pt would like for this to be done in [facility] due to her inability to drive.
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
## 2021-06-17 NOTE — Telephone Encounter (Signed)
Okay, we can refer to general surgery.  Please see if she has a preference for either location or provider.  Order pended.

## 2021-06-17 NOTE — Progress Notes (Signed)
Pt would like to restart Losartan and stop Spironolactone.   She also wanted to ask Dr. Madilyn Fireman about Flunisolide 0.025% nasal spray

## 2021-06-18 DIAGNOSIS — R079 Chest pain, unspecified: Secondary | ICD-10-CM | POA: Diagnosis not present

## 2021-06-20 DIAGNOSIS — Z87891 Personal history of nicotine dependence: Secondary | ICD-10-CM | POA: Diagnosis not present

## 2021-06-20 DIAGNOSIS — G8929 Other chronic pain: Secondary | ICD-10-CM | POA: Diagnosis not present

## 2021-06-20 DIAGNOSIS — R1013 Epigastric pain: Secondary | ICD-10-CM | POA: Diagnosis not present

## 2021-06-20 DIAGNOSIS — R0789 Other chest pain: Secondary | ICD-10-CM | POA: Diagnosis not present

## 2021-06-20 DIAGNOSIS — R9431 Abnormal electrocardiogram [ECG] [EKG]: Secondary | ICD-10-CM | POA: Diagnosis not present

## 2021-06-20 DIAGNOSIS — I10 Essential (primary) hypertension: Secondary | ICD-10-CM | POA: Diagnosis not present

## 2021-06-21 DIAGNOSIS — R9431 Abnormal electrocardiogram [ECG] [EKG]: Secondary | ICD-10-CM | POA: Diagnosis not present

## 2021-06-22 ENCOUNTER — Emergency Department (HOSPITAL_BASED_OUTPATIENT_CLINIC_OR_DEPARTMENT_OTHER)
Admission: EM | Admit: 2021-06-22 | Discharge: 2021-06-22 | Disposition: A | Discharge status: Home / Self Care | Payer: Medicare HMO | Attending: Emergency Medicine | Admitting: Emergency Medicine

## 2021-06-22 ENCOUNTER — Encounter (HOSPITAL_BASED_OUTPATIENT_CLINIC_OR_DEPARTMENT_OTHER): Payer: Self-pay

## 2021-06-22 ENCOUNTER — Other Ambulatory Visit: Payer: Self-pay

## 2021-06-22 DIAGNOSIS — K5900 Constipation, unspecified: Secondary | ICD-10-CM | POA: Insufficient documentation

## 2021-06-22 DIAGNOSIS — Z87891 Personal history of nicotine dependence: Secondary | ICD-10-CM | POA: Diagnosis not present

## 2021-06-22 DIAGNOSIS — Z8616 Personal history of COVID-19: Secondary | ICD-10-CM | POA: Diagnosis not present

## 2021-06-22 DIAGNOSIS — R1013 Epigastric pain: Secondary | ICD-10-CM

## 2021-06-22 DIAGNOSIS — Z7951 Long term (current) use of inhaled steroids: Secondary | ICD-10-CM | POA: Diagnosis not present

## 2021-06-22 DIAGNOSIS — J45901 Unspecified asthma with (acute) exacerbation: Secondary | ICD-10-CM | POA: Diagnosis not present

## 2021-06-22 DIAGNOSIS — K219 Gastro-esophageal reflux disease without esophagitis: Secondary | ICD-10-CM | POA: Insufficient documentation

## 2021-06-22 DIAGNOSIS — I10 Essential (primary) hypertension: Secondary | ICD-10-CM | POA: Diagnosis not present

## 2021-06-22 DIAGNOSIS — Z79899 Other long term (current) drug therapy: Secondary | ICD-10-CM | POA: Insufficient documentation

## 2021-06-22 DIAGNOSIS — Z8542 Personal history of malignant neoplasm of other parts of uterus: Secondary | ICD-10-CM | POA: Insufficient documentation

## 2021-06-22 DIAGNOSIS — R1011 Right upper quadrant pain: Secondary | ICD-10-CM | POA: Insufficient documentation

## 2021-06-22 LAB — COMPREHENSIVE METABOLIC PANEL
ALT: 23 U/L (ref 0–44)
AST: 20 U/L (ref 15–41)
Albumin: 3.7 g/dL (ref 3.5–5.0)
Alkaline Phosphatase: 70 U/L (ref 38–126)
Anion gap: 8 (ref 5–15)
BUN: 13 mg/dL (ref 6–20)
CO2: 25 mmol/L (ref 22–32)
Calcium: 8.8 mg/dL — ABNORMAL LOW (ref 8.9–10.3)
Chloride: 107 mmol/L (ref 98–111)
Creatinine, Ser: 0.66 mg/dL (ref 0.44–1.00)
GFR, Estimated: >60 mL/min (ref >60)
Glucose, Bld: 150 mg/dL — ABNORMAL HIGH (ref 70–99)
Potassium: 3.6 mmol/L (ref 3.5–5.1)
Sodium: 140 mmol/L (ref 135–145)
Total Bilirubin: 0.3 mg/dL (ref 0.3–1.2)
Total Protein: 7 g/dL (ref 6.5–8.1)

## 2021-06-22 LAB — URINALYSIS, ROUTINE W REFLEX MICROSCOPIC
Bilirubin Urine: NEGATIVE
Glucose, UA: NEGATIVE mg/dL
Hgb urine dipstick: NEGATIVE
Ketones, ur: NEGATIVE mg/dL
Leukocytes,Ua: NEGATIVE
Nitrite: NEGATIVE
Protein, ur: NEGATIVE mg/dL
Specific Gravity, Urine: 1.025 (ref 1.005–1.030)
pH: 5.5 (ref 5.0–8.0)

## 2021-06-22 LAB — CBC WITH DIFFERENTIAL/PLATELET
Abs Immature Granulocytes: 0.02 10*3/uL (ref 0.00–0.07)
Basophils Absolute: 0.1 10*3/uL (ref 0.0–0.1)
Basophils Relative: 1 %
Eosinophils Absolute: 0.2 10*3/uL (ref 0.0–0.5)
Eosinophils Relative: 3 %
HCT: 43.9 % (ref 36.0–46.0)
Hemoglobin: 14.6 g/dL (ref 12.0–15.0)
Immature Granulocytes: 0 %
Lymphocytes Relative: 18 %
Lymphs Abs: 1.3 10*3/uL (ref 0.7–4.0)
MCH: 30.2 pg (ref 26.0–34.0)
MCHC: 33.3 g/dL (ref 30.0–36.0)
MCV: 90.7 fL (ref 80.0–100.0)
Monocytes Absolute: 0.5 10*3/uL (ref 0.1–1.0)
Monocytes Relative: 7 %
Neutro Abs: 5 10*3/uL (ref 1.7–7.7)
Neutrophils Relative %: 71 %
Platelets: 188 10*3/uL (ref 150–400)
RBC: 4.84 MIL/uL (ref 3.87–5.11)
RDW: 14 % (ref 11.5–15.5)
WBC: 7 10*3/uL (ref 4.0–10.5)
nRBC: 0 % (ref 0.0–0.2)

## 2021-06-22 LAB — TROPONIN I (HIGH SENSITIVITY): Troponin I (High Sensitivity): 3 ng/L (ref <18)

## 2021-06-22 LAB — LIPASE, BLOOD: Lipase: 40 U/L (ref 11–51)

## 2021-06-22 NOTE — ED Triage Notes (Signed)
Pt reports right upper abdominal pain for 5 days. Pain is under right breast and radiates around to back. Pain is constant and varies in intensity. . Pt has been taking stool softners due to constipation. Last BM this morning. Reports problem swallowing food and has had a barium swallow. Reports eating makes pain worse. Seen by Dr Caryn Section a couple days ago

## 2021-06-22 NOTE — Discharge Instructions (Signed)
Follow-up with your gastroenterologist and your primary care doctor.  Return to the emergency room if you develop chest pain, difficulty in breathing, vomiting, fever or other new concerning symptom.
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
## 2021-06-23 ENCOUNTER — Emergency Department (HOSPITAL_BASED_OUTPATIENT_CLINIC_OR_DEPARTMENT_OTHER)
Admission: EM | Admit: 2021-06-23 | Discharge: 2021-06-23 | Disposition: A | Discharge status: Home / Self Care | Payer: Medicare HMO | Attending: Emergency Medicine | Admitting: Emergency Medicine

## 2021-06-23 ENCOUNTER — Other Ambulatory Visit: Payer: Self-pay

## 2021-06-23 ENCOUNTER — Ambulatory Visit: Payer: Medicare HMO | Admitting: Family Medicine

## 2021-06-23 ENCOUNTER — Encounter (HOSPITAL_BASED_OUTPATIENT_CLINIC_OR_DEPARTMENT_OTHER): Payer: Self-pay

## 2021-06-23 DIAGNOSIS — R079 Chest pain, unspecified: Secondary | ICD-10-CM | POA: Diagnosis not present

## 2021-06-23 DIAGNOSIS — Z5321 Procedure and treatment not carried out due to patient leaving prior to being seen by health care provider: Secondary | ICD-10-CM | POA: Insufficient documentation

## 2021-06-23 DIAGNOSIS — R0789 Other chest pain: Secondary | ICD-10-CM | POA: Diagnosis not present

## 2021-06-23 DIAGNOSIS — K5900 Constipation, unspecified: Secondary | ICD-10-CM | POA: Diagnosis not present

## 2021-06-23 DIAGNOSIS — R9431 Abnormal electrocardiogram [ECG] [EKG]: Secondary | ICD-10-CM | POA: Diagnosis not present

## 2021-06-23 NOTE — ED Triage Notes (Signed)
Pt arrives with reports of continued CP was seen and evaluated here yesterday for same. EKG obtained in triage. Has planned heart cath on the 18th of Nov.

## 2021-06-24 ENCOUNTER — Encounter: Payer: Self-pay | Admitting: Family Medicine

## 2021-06-24 ENCOUNTER — Ambulatory Visit (INDEPENDENT_AMBULATORY_CARE_PROVIDER_SITE_OTHER): Payer: Medicare HMO | Admitting: Family Medicine

## 2021-06-24 ENCOUNTER — Ambulatory Visit (INDEPENDENT_AMBULATORY_CARE_PROVIDER_SITE_OTHER): Payer: Medicare HMO

## 2021-06-24 VITALS — BP 156/81 | HR 78 | Ht 63.0 in | Wt 225.0 lb

## 2021-06-24 DIAGNOSIS — M25562 Pain in left knee: Secondary | ICD-10-CM

## 2021-06-24 DIAGNOSIS — S8992XA Unspecified injury of left lower leg, initial encounter: Secondary | ICD-10-CM | POA: Diagnosis not present

## 2021-06-24 DIAGNOSIS — S40022A Contusion of left upper arm, initial encounter: Secondary | ICD-10-CM

## 2021-06-24 DIAGNOSIS — S20222A Contusion of left back wall of thorax, initial encounter: Secondary | ICD-10-CM | POA: Diagnosis not present

## 2021-06-24 NOTE — Assessment & Plan Note (Signed)
Pressure still not well controlled today.  And really like her to try taking that spironolactone more regularly.  Preferably daily.

## 2021-06-24 NOTE — Assessment & Plan Note (Signed)
Prescription sent for test strips and lancets.  Last A1c was up a little bit at 6.3.  Continue to work on healthy food choices and staying active to reduce glucose levels.  We will monitor again in November or December.  Lab Results  Component Value Date   HGBA1C 6.3 04/04/2021

## 2021-06-24 NOTE — Progress Notes (Addendum)
Established patient visit   Patient: Jennifer [name]   DOB: 1965/10/09   55 y.o. Female  MRN: [id number] Visit Date: 06/24/2021  Today's healthcare provider: Beatrice Lecher, MD   Chief Complaint  Patient presents with   Knee Injury         Subjective   HPI HPI     Knee Injury    Additional comments:        Last edited by Teddy Spike, CMA on 06/24/2021  1:46 PM.      She reports that yesterday she was stomping on a Kleenex box, when her leg slipped out from under her and she fell but she thinks her leg actually went backwards, and she actually fell backwards and landed on a storage basket and bruised her left mid back and her left posterior arm.  She is having pain in those areas but is most concerned about her left knee.  She says it is very painful to walk and just stand and put pressure on her leg.  She feels like her ankle is very sore.  Orts it feels very tight behind the knee but most of her pain is anterior.  It is painful with flexion.  Review of Systems     Current Meds  Medication Sig   ACCU-CHEK GUIDE test strip For testing blood sugars dailyDx:r73.03   Accu-Chek Softclix Lancets lancets Dx:r73.03   AMBULATORY NON FORMULARY MEDICATION Medication Name: Wheelchair.  Dx. Multiple sclerosis, lower extremity weakness.   blood glucose meter kit and supplies Dispense based on patient and insurance preference. Use up to four times daily as directed. R73.03   diazepam (VALIUM) 2 MG tablet Take 1 tablet (2 mg total) by mouth daily as needed for anxiety.   flunisolide (NASALIDE) 25 MCG/ACT (0.025%) SOLN Place 2 sprays into the nose 2 (two) times daily.   levalbuterol (XOPENEX HFA) 45 MCG/ACT inhaler INHALE 2 PUFFS INTO THE LUNGS EVERY 6 HOURS AS NEEDED FOR WHEEZING (Patient taking differently: Inhale 1 puff into the lungs daily as needed for wheezing or shortness of breath.)   levalbuterol (XOPENEX) 1.25 MG/3ML nebulizer solution Take 1.25 mg by nebulization  every 3 (three) hours as needed for wheezing.   losartan (COZAAR) 25 MG tablet Take 1 tablet (25 mg total) by mouth daily.   metoprolol tartrate (LOPRESSOR) 25 MG tablet Take 1 tablet (25 mg total) by mouth in the morning, at noon, and at bedtime.   MINOXIDIL, TOPICAL, 5 % SOLN Apply 1 application topically in the morning and at bedtime.   potassium chloride 20 MEQ/15ML (10%) SOLN Take 15 mLs (20 mEq total) by mouth daily.    Objective   BP (!) 156/81   Pulse 78   Ht _0  (1.6 m)   Wt 225 lb (102.1 kg)   LMP 06/25/2013   SpO2 100%   BMI 39.86 kg/m   Physical Exam Vitals reviewed.  Constitutional:      Appearance: She is well-developed.  HENT:     Head: Normocephalic and atraumatic.  Eyes:     Conjunctiva/sclera: Conjunctivae normal.  Cardiovascular:     Rate and Rhythm: Normal rate.  Pulmonary:     Effort: Pulmonary effort is normal.  Musculoskeletal:     Comments: She is tender just lateral and inferior to the patella.  Tender along the lateral joint line.  Nontender over the patella directly.  She has a little bit of tenderness into the calf muscle.  Ankle is mildly tender  medially.  But otherwise normal range of motion of the ankle.  She is able to extend her knee but has pain with flexion.  Nontender over the lateral hip.  Skin:    General: Skin is dry.     Coloration: Skin is not pale.     Comments: Is have significant bruising over her left mid back over the ribs as well as over the left posterior upper arm.  Neurological:     Mental Status: She is alert and oriented to person, place, and time.  Psychiatric:        Behavior: Behavior normal.        Assessment & Plan    Problem List Items Addressed This Visit   None Visit Diagnoses     Acute pain of left knee    -  Primary   Relevant Orders   DG Knee Complete 4 Views Left   Contusion, back, left, initial encounter       Contusion of left upper extremity, initial encounter           Left knee pain status  post slip fall injury-she did not have a direct impact on the leg but I would like to get an x-ray because she is having difficulty and pain bearing weight.  She did try some ice and heat last night for pain and discomfort.  Suspect is most likely soft tissue injury at this point.  I think she would benefit from a knee brace to rest the knee and then have her follow-up next week with sports medicine.  She actually already has an appointment at the orthopedist for her shoulder next week so she is got a switch that to an appointment for her knee so that they can follow-up that way hopefully she can get a little bit of the swelling down over the weekend.  Recommend icing frequently.  Given 600 mg of ibuprofen here in the office she can take 60 mg every 6 hours as needed.  Fitted for compression and supportive brace  Contusions of back and left arm-recommend ice and gentle massage.  Return if symptoms worsen or fail to improve.      No orders of the defined types were placed in this encounter.   Orders Placed This Encounter  Procedures   DG Knee Complete 4 Views Left    Standing Status:   Future    Number of Occurrences:   1    Standing Expiration Date:   06/24/2022    Order Specific Question:   Reason for Exam (SYMPTOM  OR DIAGNOSIS REQUIRED)    Answer:   Left knee pain s/p slip injury. painful to bear weight    Order Specific Question:   Is patient pregnant?    Answer:   No    Order Specific Question:   Preferred imaging location?    Answer:   [facility]  [facility] [phone] (phone) [phone] (fax)  [place]

## 2021-06-24 NOTE — Patient Instructions (Signed)
Please take 600 mg of ibuprofen every 6-8 hours as needed for pain and swelling.  Okay to alternate with Tylenol if you would like. Ice the area for 5 to 10 minutes several times throughout the day. To keep elevated with a small pillow underneath to provide just a little bit of flexion to the knee.

## 2021-06-28 NOTE — Progress Notes (Signed)
Great news!  No sign of fracture.

## 2021-06-29 DIAGNOSIS — R1084 Generalized abdominal pain: Secondary | ICD-10-CM | POA: Diagnosis not present

## 2021-06-29 DIAGNOSIS — R109 Unspecified abdominal pain: Secondary | ICD-10-CM | POA: Diagnosis not present

## 2021-06-29 DIAGNOSIS — I10 Essential (primary) hypertension: Secondary | ICD-10-CM | POA: Diagnosis not present

## 2021-06-30 ENCOUNTER — Telehealth (INDEPENDENT_AMBULATORY_CARE_PROVIDER_SITE_OTHER): Payer: Medicare HMO | Admitting: Family Medicine

## 2021-06-30 DIAGNOSIS — R079 Chest pain, unspecified: Secondary | ICD-10-CM

## 2021-06-30 DIAGNOSIS — K5909 Other constipation: Secondary | ICD-10-CM

## 2021-06-30 DIAGNOSIS — M25562 Pain in left knee: Secondary | ICD-10-CM | POA: Diagnosis not present

## 2021-06-30 MED ORDER — LACTULOSE 10 GM/15ML PO SOLN: 20.0000 g | Freq: Two times a day (BID) | ORAL | 2 refills | Status: AC | PRN

## 2021-06-30 NOTE — Progress Notes (Signed)
Spoke w/pt and she stated that she was waiting for a call from the nurse from the Cath Lab?  She stated that she was contemplating on whether or not she should go thru with having the procedure done and wanted to discuss this with Dr. Madilyn Fireman. I told her that this was a personal choice for her to make.

## 2021-06-30 NOTE — Progress Notes (Signed)
Virtual Visit via Telephone Note  I connected with Jennifer [name] on 06/30/21 at  2:00 PM EST by telephone and verified that I am speaking with the correct person using two identifiers.   I discussed the limitations, risks, security and privacy concerns of performing an evaluation and management service by telephone and the availability of in person appointments. I also discussed with the patient that there may be a patient responsible charge related to this service. The patient expressed understanding and agreed to proceed.  Patient location: Provider loccation: In office   Subjective:    CC:  No chief complaint on file.   HPI: Says saw ortho this AM for her knee.  They felt like it was related to the cartilage behind her knee.  But she was concerned that because it was a more acute injury that it may be something else going on.  X-rays were negative for any type of fracture.  They felt she needs PT.   Scheduled for heart cath tomorrow Am and awaiting a call for them.  There were supposed to call around 130 for the preop.  She is still worried that she may or may not be doing the right thing by having a heart cath done the last 1 was about 20 years ago but she has been having chest pain with activity and has a family history of heart disease.  1 sister with premature heart disease.  She went to the ED yesterday for abdominal pain.  Also seen on November 9th and 10th for similar issues still struggling with her BMs.  Having a hard time passing her stools and they still are flat but that is been an ongoing issue her colonoscopy is up-to-date.  She thinks she might have to go back on lactulose the mag citrate does not seem to be helping   Past medical history, Surgical history, Family history not pertinant except as noted below, Social history, Allergies, and medications have been entered into the medical record, reviewed, and corrections made.   Review of Systems: No fevers, chills,  night sweats, weight loss, chest pain, or shortness of breath.   Objective:    General: Speaking clearly in complete sentences without any shortness of breath.  Alert and oriented x3.  Normal judgment. No apparent acute distress.    Impression and Recommendations:    Problem List Items Addressed This Visit       Digestive   Chronic constipation    She has always with lactulose even though its not her favorite but I think getting started back on that would be helpful and using it more regularly.      Relevant Medications   lactulose (CHRONULAC) 10 GM/15ML solution   Other Visit Diagnoses     Acute pain of left knee    -  Primary   Chest pain, unspecified type           Meds ordered this encounter  Medications   lactulose (CHRONULAC) 10 GM/15ML solution    Sig: Take 30 mLs (20 g total) by mouth 2 (two) times daily as needed for mild constipation.    Dispense:  236 mL    Refill:  2    Meds ordered this encounter  Medications   lactulose (CHRONULAC) 10 GM/15ML solution    Sig: Take 30 mLs (20 g total) by mouth 2 (two) times daily as needed for mild constipation.    Dispense:  236 mL    Refill:  2  Left knee pain-did see Ortho this morning and they recommended formal physical therapy I think that would be a great place for her to start especially with nose known fracture and then see if she continues to improve or not.  Chest pain-I do think she would benefit from just having a cardiac cath I think it would honestly rule out any coronary artery disease as a potential cause for her chest pain that she has on and off.  Let her know that it would not address the palpitations and heart arrhythmia.  That really is more of an electrical issue of the heart but at least we would know there is no coronary artery disease that is worrisome.  Keep regular follow-up in 2 weeks.   I discussed the assessment and treatment plan with the patient. The patient was provided an opportunity  to ask questions and all were answered. The patient agreed with the plan and demonstrated an understanding of the instructions.   The patient was advised to call back or seek an in-person evaluation if the symptoms worsen or if the condition fails to improve as anticipated.  I provided 21 minutes of non-face-to-face time during this encounter.   Beatrice Lecher, MD

## 2021-06-30 NOTE — Assessment & Plan Note (Signed)
She has always with lactulose even though its not her favorite but I think getting started back on that would be helpful and using it more regularly.

## 2021-07-01 ENCOUNTER — Ambulatory Visit: Payer: Medicare HMO | Admitting: Family Medicine

## 2021-07-02 DIAGNOSIS — M545 Low back pain, unspecified: Secondary | ICD-10-CM | POA: Diagnosis not present

## 2021-07-02 DIAGNOSIS — R0602 Shortness of breath: Secondary | ICD-10-CM | POA: Diagnosis not present

## 2021-07-02 DIAGNOSIS — Z9181 History of falling: Secondary | ICD-10-CM | POA: Diagnosis not present

## 2021-07-02 DIAGNOSIS — Z043 Encounter for examination and observation following other accident: Secondary | ICD-10-CM | POA: Diagnosis not present

## 2021-07-02 DIAGNOSIS — I700 Atherosclerosis of aorta: Secondary | ICD-10-CM | POA: Diagnosis not present

## 2021-07-02 DIAGNOSIS — R5383 Other fatigue: Secondary | ICD-10-CM | POA: Diagnosis not present

## 2021-07-02 DIAGNOSIS — R002 Palpitations: Secondary | ICD-10-CM | POA: Diagnosis not present

## 2021-07-02 DIAGNOSIS — Z20822 Contact with and (suspected) exposure to covid-19: Secondary | ICD-10-CM | POA: Diagnosis not present

## 2021-07-02 DIAGNOSIS — R0789 Other chest pain: Secondary | ICD-10-CM | POA: Diagnosis not present

## 2021-07-02 DIAGNOSIS — R9431 Abnormal electrocardiogram [ECG] [EKG]: Secondary | ICD-10-CM | POA: Diagnosis not present

## 2021-07-03 DIAGNOSIS — I700 Atherosclerosis of aorta: Secondary | ICD-10-CM | POA: Diagnosis not present

## 2021-07-03 DIAGNOSIS — Z043 Encounter for examination and observation following other accident: Secondary | ICD-10-CM | POA: Diagnosis not present

## 2021-07-04 ENCOUNTER — Encounter: Payer: Self-pay | Admitting: Family Medicine

## 2021-07-04 DIAGNOSIS — I10 Essential (primary) hypertension: Secondary | ICD-10-CM | POA: Diagnosis not present

## 2021-07-04 DIAGNOSIS — R9431 Abnormal electrocardiogram [ECG] [EKG]: Secondary | ICD-10-CM | POA: Diagnosis not present

## 2021-07-04 NOTE — Progress Notes (Deleted)
Virtual Video Visit via MyChart Note  I connected with  [name] on 07/04/21 at  2:20 PM EST by the video enabled telemedicine application for MyChart, and verified that I a/m speaking with the correct person using two identifiers.   I introduced myself as a Designer, jewellery with the practice. We discussed the limitations of evaluation and management by telemedicine and the availability of in person appointments. The patient expressed understanding and agreed to proceed.  Participating parties in this visit include: The patient and the nurse practitioner listed. *** The patient is: At home I am: In the office - [facility]  Subjective:    CC: No chief complaint on file.   HPI: Jennifer [name] is a 55 y.o. year old female presenting today via [place] today for ***.  07/02/21 ED with chest pain: CP radiating to L shoulder, neck, breast and arm lasting for aout an hour. Recent shortness of breath and chest pain with ADLs. She had previously been scheduled to have a cardiac cath last week, but cardiologist changed to CTA - test pending. Negative troponin and BNP.  Today she reports...          Past medical history, Surgical history, Family history not pertinant except as noted below, Social history, Allergies, and medications have been entered into the medical record, reviewed, and corrections made.   Review of Systems:  All review of systems negative except what is listed in the HPI   Objective:    General:  Speaking clearly in complete sentences. {(BH) RANGE ABSENT/SEVERE:20013} shortness of breath noted.   Alert and oriented x3.   Normal judgment.  {ABSENTORPRESENT:304960286} acute distress.   Impression and Recommendations:    There are no diagnoses linked to this encounter.    Follow-up if symptoms worsen or fail to improve.    I discussed the assessment and treatment plan with the patient. The patient was provided an opportunity to ask  questions and all were answered. The patient agreed with the plan and demonstrated an understanding of the instructions.   The patient was advised to call back or seek an in-person evaluation if the symptoms worsen or if the condition fails to improve as anticipated.  I spent *** minutes dedicated to the care of this patient on the date of this encounter to include pre-visit chart review of prior notes and results, face-to-face time with the patient, and post-visit ordering of testing as indicated.   Terrilyn Saver, NP

## 2021-07-05 ENCOUNTER — Telehealth: Payer: Medicare HMO | Admitting: Family Medicine

## 2021-07-05 ENCOUNTER — Ambulatory Visit: Payer: Medicare HMO | Admitting: Family Medicine

## 2021-07-05 MED ORDER — BUSPIRONE HCL 5 MG PO TABS: 5.0000 mg | ORAL_TABLET | Freq: Two times a day (BID) | ORAL | 0 refills | Status: DC

## 2021-07-05 NOTE — Telephone Encounter (Signed)
Meds ordered this encounter  Medications   busPIRone (BUSPAR) 5 MG tablet    Sig: Take 1 tablet (5 mg total) by mouth 2 (two) times daily.    Dispense:  60 tablet    Refill:  0

## 2021-07-06 DIAGNOSIS — F419 Anxiety disorder, unspecified: Secondary | ICD-10-CM | POA: Diagnosis not present

## 2021-07-06 DIAGNOSIS — R9431 Abnormal electrocardiogram [ECG] [EKG]: Secondary | ICD-10-CM | POA: Diagnosis not present

## 2021-07-06 DIAGNOSIS — R079 Chest pain, unspecified: Secondary | ICD-10-CM | POA: Diagnosis not present

## 2021-07-06 DIAGNOSIS — S2231XA Fracture of one rib, right side, initial encounter for closed fracture: Secondary | ICD-10-CM | POA: Diagnosis not present

## 2021-07-06 DIAGNOSIS — R0602 Shortness of breath: Secondary | ICD-10-CM | POA: Diagnosis not present

## 2021-07-06 DIAGNOSIS — Z87891 Personal history of nicotine dependence: Secondary | ICD-10-CM | POA: Diagnosis not present

## 2021-07-06 DIAGNOSIS — J9811 Atelectasis: Secondary | ICD-10-CM | POA: Diagnosis not present

## 2021-07-07 IMAGING — DX DG CHEST 1V PORT
1 series · 1 of 1 positions shown · non-contrast
Comparison: 04/05/2020

CLINICAL DATA: Sinus infection

EXAM:
PORTABLE CHEST 1 VIEW

[chest ap]
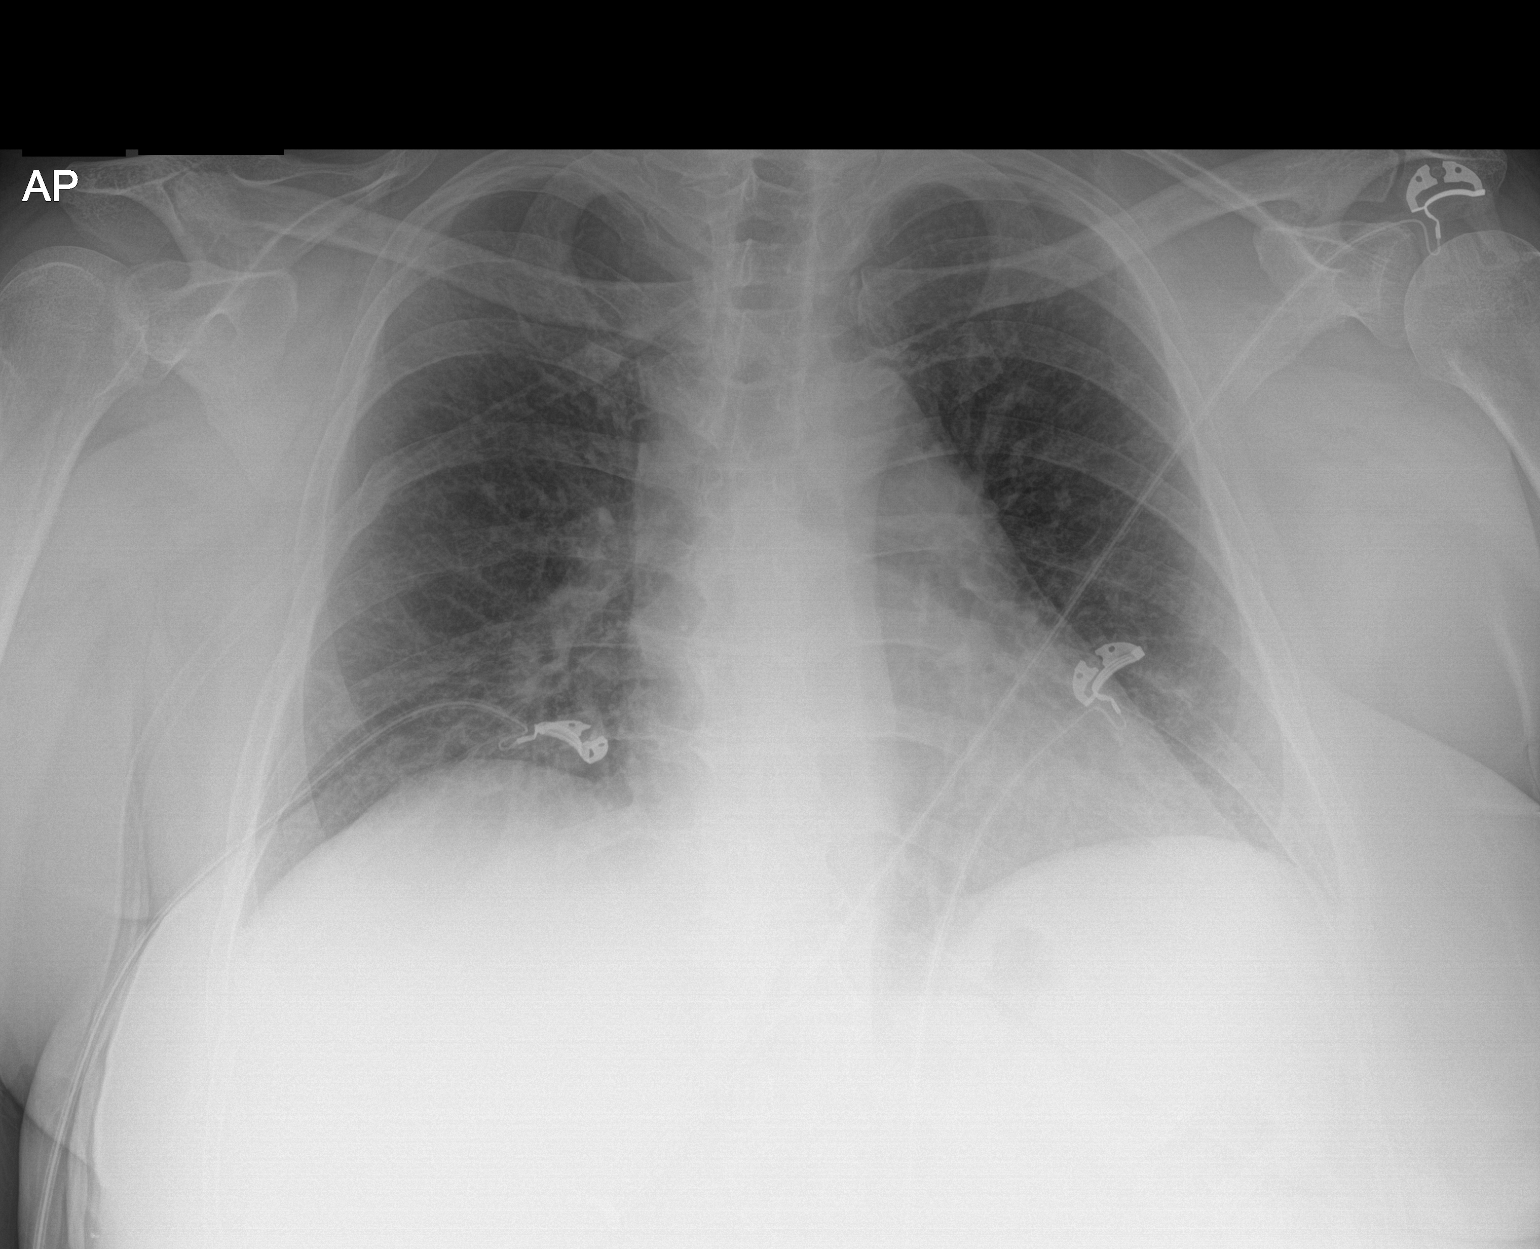

[1 of 1 positions shown; findings below may reference images not displayed]

FINDINGS: The heart size and mediastinal contours are stable. No focal
airspace consolidation, pleural effusion, or pneumothorax. Remote
healed right-sided sixth rib fracture. New or acute osseous findings
identified.
IMPRESSION: No active disease.

## 2021-07-08 DIAGNOSIS — R079 Chest pain, unspecified: Secondary | ICD-10-CM | POA: Diagnosis not present

## 2021-07-08 DIAGNOSIS — R0789 Other chest pain: Secondary | ICD-10-CM | POA: Diagnosis not present

## 2021-07-09 DIAGNOSIS — R9431 Abnormal electrocardiogram [ECG] [EKG]: Secondary | ICD-10-CM | POA: Diagnosis not present

## 2021-07-10 DIAGNOSIS — R0789 Other chest pain: Secondary | ICD-10-CM | POA: Diagnosis not present

## 2021-07-10 DIAGNOSIS — R079 Chest pain, unspecified: Secondary | ICD-10-CM | POA: Diagnosis not present

## 2021-07-10 DIAGNOSIS — G8929 Other chronic pain: Secondary | ICD-10-CM | POA: Diagnosis not present

## 2021-07-10 DIAGNOSIS — M542 Cervicalgia: Secondary | ICD-10-CM | POA: Diagnosis not present

## 2021-07-10 DIAGNOSIS — I498 Other specified cardiac arrhythmias: Secondary | ICD-10-CM | POA: Diagnosis not present

## 2021-07-10 DIAGNOSIS — I10 Essential (primary) hypertension: Secondary | ICD-10-CM | POA: Diagnosis not present

## 2021-07-11 ENCOUNTER — Encounter: Payer: Self-pay | Admitting: Family Medicine

## 2021-07-11 DIAGNOSIS — M25562 Pain in left knee: Secondary | ICD-10-CM

## 2021-07-12 DIAGNOSIS — Y999 Unspecified external cause status: Secondary | ICD-10-CM | POA: Diagnosis not present

## 2021-07-12 DIAGNOSIS — M25562 Pain in left knee: Secondary | ICD-10-CM | POA: Diagnosis not present

## 2021-07-12 DIAGNOSIS — W1839XA Other fall on same level, initial encounter: Secondary | ICD-10-CM | POA: Diagnosis not present

## 2021-07-12 DIAGNOSIS — F410 Panic disorder [episodic paroxysmal anxiety] without agoraphobia: Secondary | ICD-10-CM | POA: Diagnosis not present

## 2021-07-12 DIAGNOSIS — H9202 Otalgia, left ear: Secondary | ICD-10-CM | POA: Diagnosis not present

## 2021-07-12 DIAGNOSIS — G894 Chronic pain syndrome: Secondary | ICD-10-CM | POA: Diagnosis not present

## 2021-07-12 DIAGNOSIS — I10 Essential (primary) hypertension: Secondary | ICD-10-CM | POA: Diagnosis not present

## 2021-07-12 NOTE — Telephone Encounter (Signed)
Orders Placed This Encounter  Procedures   MR Knee Left  Wo Contrast    Injury x 4 in the last 6 months.  Persistant pain and swelling with neg xrays.    Standing Status:   Future    Standing Expiration Date:   07/12/2022    Order Specific Question:   What is the patient's sedation requirement?    Answer:   No Sedation    Order Specific Question:   Does the patient have a pacemaker or implanted devices?    Answer:   No    Order Specific Question:   Preferred imaging location?    Answer:   Product/process development scientist (table limit-350lbs)

## 2021-07-14 DIAGNOSIS — I720 Aneurysm of carotid artery: Secondary | ICD-10-CM | POA: Diagnosis not present

## 2021-07-15 ENCOUNTER — Ambulatory Visit (INDEPENDENT_AMBULATORY_CARE_PROVIDER_SITE_OTHER): Payer: Medicare HMO | Admitting: Family Medicine

## 2021-07-15 ENCOUNTER — Other Ambulatory Visit: Payer: Self-pay

## 2021-07-15 ENCOUNTER — Encounter: Payer: Self-pay | Admitting: Family Medicine

## 2021-07-15 VITALS — BP 159/83 | HR 77 | Ht 63.0 in | Wt 230.0 lb

## 2021-07-15 DIAGNOSIS — M7989 Other specified soft tissue disorders: Secondary | ICD-10-CM

## 2021-07-15 DIAGNOSIS — K219 Gastro-esophageal reflux disease without esophagitis: Secondary | ICD-10-CM | POA: Diagnosis not present

## 2021-07-15 DIAGNOSIS — I10 Essential (primary) hypertension: Secondary | ICD-10-CM

## 2021-07-15 DIAGNOSIS — F418 Other specified anxiety disorders: Secondary | ICD-10-CM | POA: Diagnosis not present

## 2021-07-15 DIAGNOSIS — I878 Other specified disorders of veins: Secondary | ICD-10-CM

## 2021-07-15 DIAGNOSIS — Z6841 Body Mass Index (BMI) 40.0 and over, adult: Secondary | ICD-10-CM

## 2021-07-15 MED ORDER — FUROSEMIDE 20 MG PO TABS: 20.0000 mg | ORAL_TABLET | Freq: Every day | ORAL | 0 refills | Status: DC | PRN

## 2021-07-15 NOTE — Assessment & Plan Note (Signed)
BMI 40-we discussed considering referral to a weight management clinic with a board-certified physician and bariatric medicine I think this could be really helpful for her in the long run she really feels like she does okay with food choices and portion control she is not very active because of her medical problems but is concerned that she is just continuing to gain weight.
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
## 2021-07-15 NOTE — Assessment & Plan Note (Signed)
Okay to use the famotidine more regularly make sure avoiding greasy and spicy foods.

## 2021-07-15 NOTE — Assessment & Plan Note (Signed)
She admits sent a MyChart and I had sent in a prescription for BuSpar she said she did pick it up but has not started it yet.

## 2021-07-15 NOTE — Assessment & Plan Note (Signed)
Blood pressure is elevated today repeat was a little better.  I think some of this is also fluid retention she does have some trace pitting edema in both ankles bilaterally we discussed taking Lasix with her 20 mill equivalent potassium on Saturday and Sunday morning and seeing if she is able to get some of the fluid off.

## 2021-07-17 IMAGING — DX DG FOOT COMPLETE 3+V*R*
3 series · 3 of 3 positions shown · non-contrast
Comparison: Radiograph 11/14/2018

CLINICAL DATA: Oxygen tank fell on foot, pain and bruising over the
first and second distal metatarsals

EXAM:
RIGHT FOOT COMPLETE - 3+ VIEW

[foot ap]
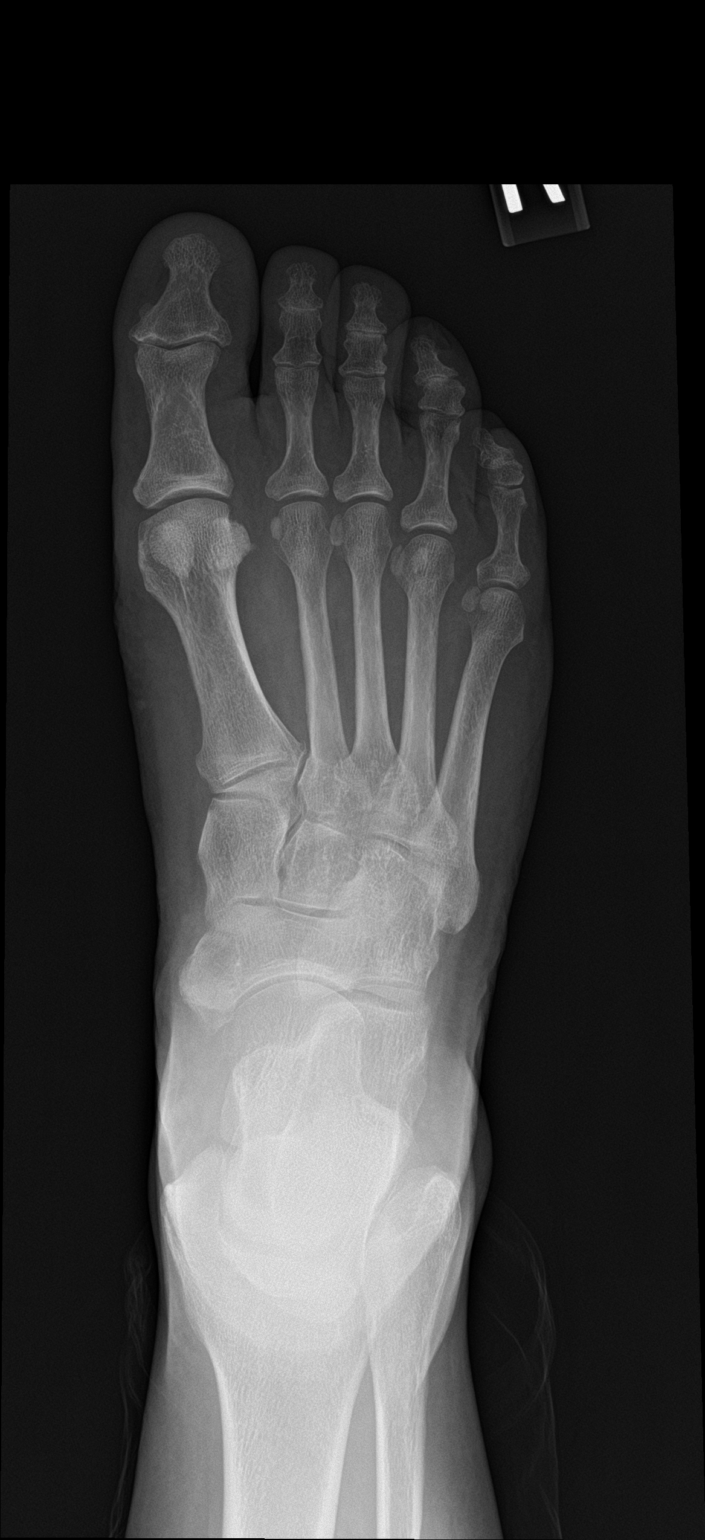

[foot obl]
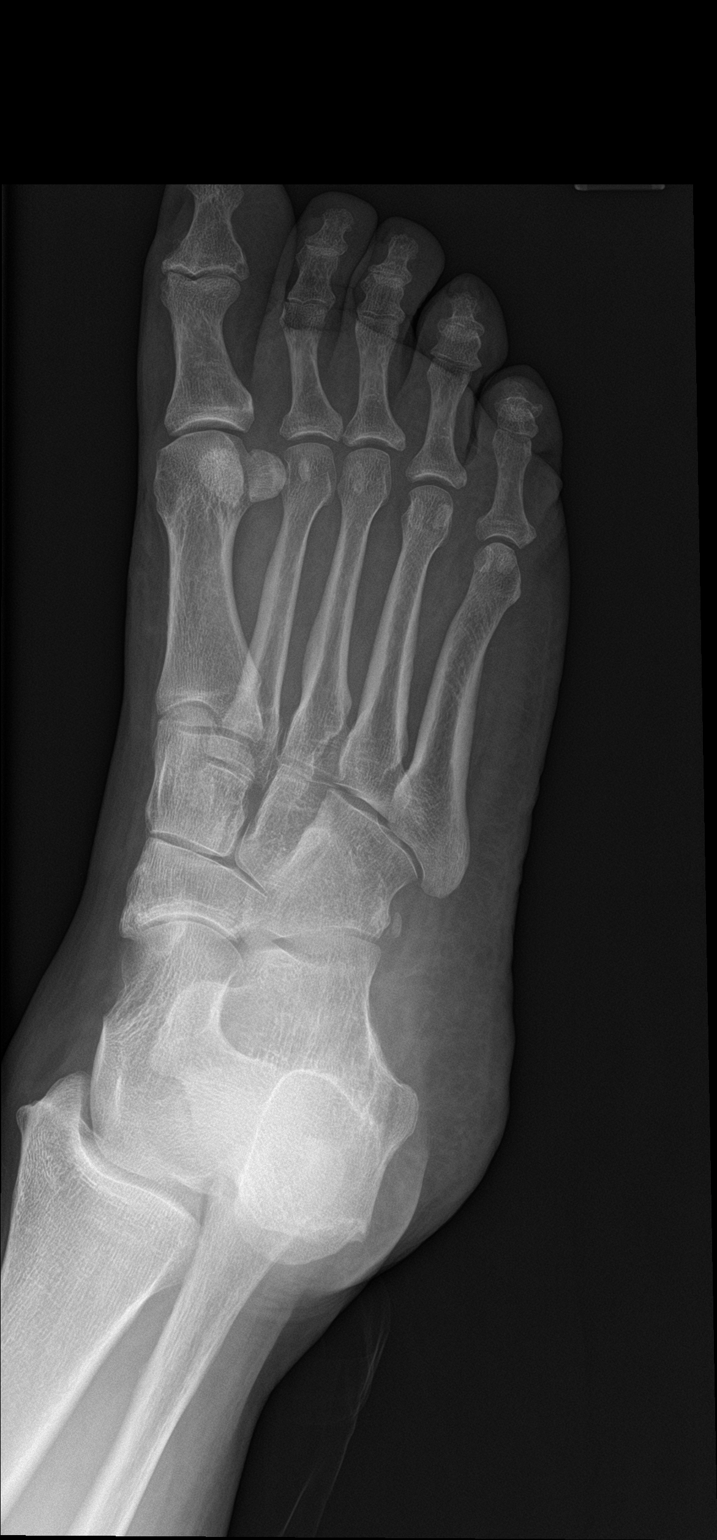

[foot lat]
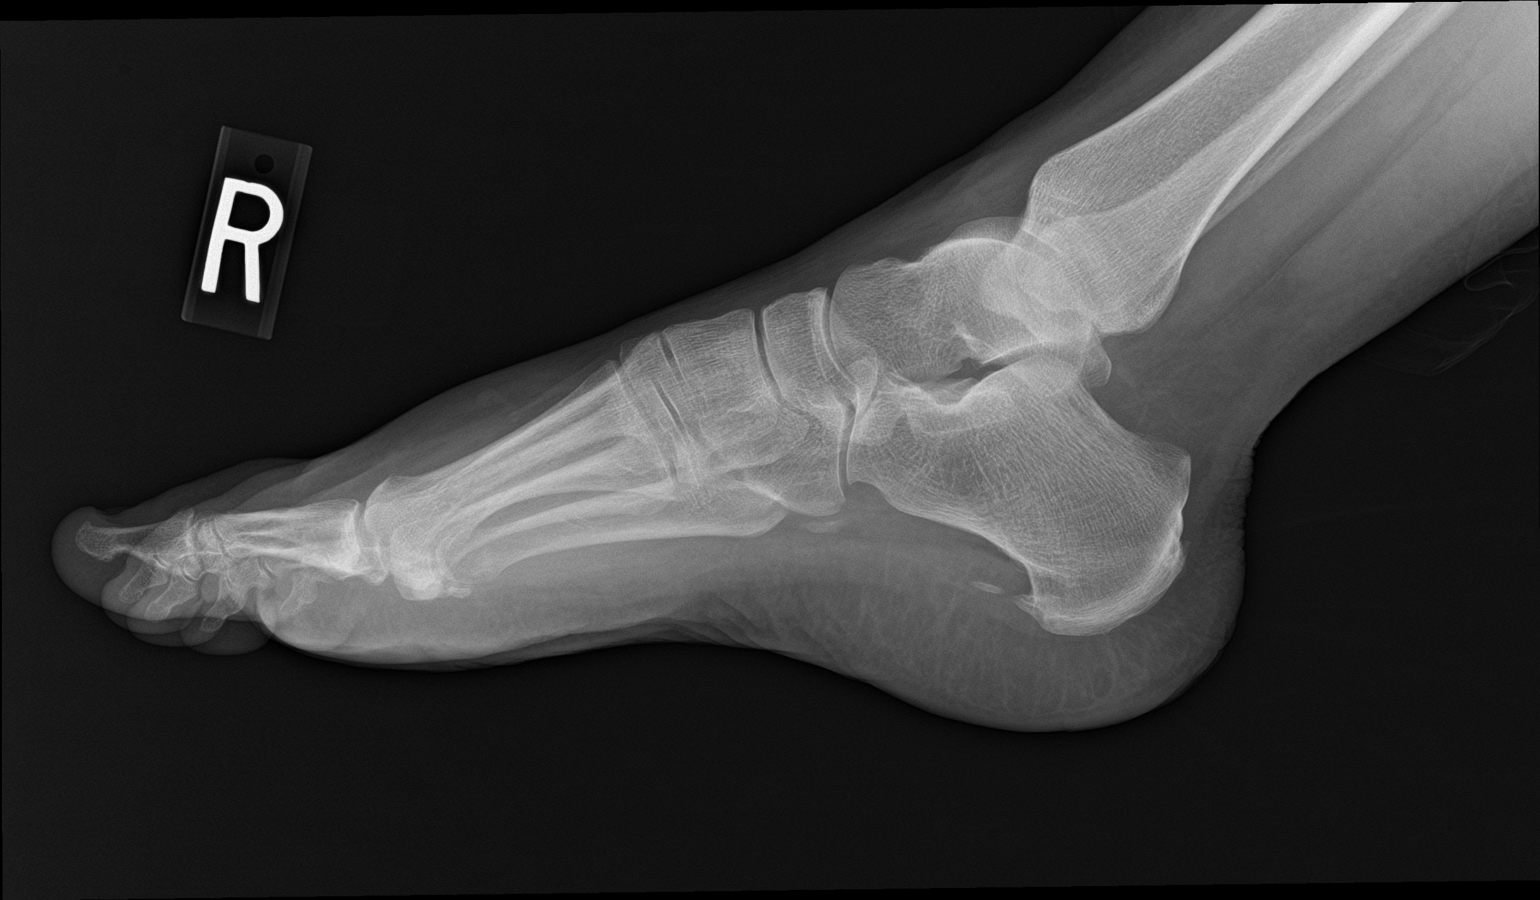

[3 of 3 positions shown; findings below may reference images not displayed]

FINDINGS: Mild soft tissue swelling noted along the dorsal aspect of the foot
at the level of the distal metatarsals. No soft tissue gas or
foreign body thin linear lucency seen extending through the base of
the second proximal phalanx could reflect a nondisplaced fracture
versus vascular channel. Correlate for point tenderness. No other
acute or suspicious lucencies or traumatic malalignment is seen. At
most mild degenerative changes throughout the foot. Midfoot and
hindfoot alignment is grossly preserved though incompletely assessed
on nonweightbearing films. Corticated os peroneum is noted.
Bidirectional calcaneal spurs and enthesopathic calcification along
the plantar ligament.
IMPRESSION: 1. Mild soft tissue swelling along the dorsal aspect of the foot at
the level of the distal metatarsals.
2. Thin linear lucency extending through the base of the second
proximal phalanx could reflect a nondisplaced fracture versus
vascular channel. Correlate for point tenderness.
3. No other acute fracture or discernible malalignment.
4. Degenerative changes and calcaneal spurring as above.

## 2021-07-19 ENCOUNTER — Ambulatory Visit: Payer: Medicare HMO | Admitting: Family Medicine

## 2021-07-20 ENCOUNTER — Encounter: Payer: Self-pay | Admitting: Allergy

## 2021-07-20 ENCOUNTER — Telehealth: Payer: Self-pay | Admitting: Allergy

## 2021-07-20 ENCOUNTER — Ambulatory Visit (INDEPENDENT_AMBULATORY_CARE_PROVIDER_SITE_OTHER): Payer: Medicare HMO | Admitting: Allergy

## 2021-07-20 ENCOUNTER — Other Ambulatory Visit: Payer: Self-pay

## 2021-07-20 VITALS — HR 90 | Temp 97.9°F | Resp 16 | Ht 62.0 in | Wt 226.8 lb

## 2021-07-20 DIAGNOSIS — J4541 Moderate persistent asthma with (acute) exacerbation: Secondary | ICD-10-CM | POA: Diagnosis not present

## 2021-07-20 DIAGNOSIS — R8281 Pyuria: Secondary | ICD-10-CM | POA: Diagnosis not present

## 2021-07-20 DIAGNOSIS — Z87891 Personal history of nicotine dependence: Secondary | ICD-10-CM | POA: Diagnosis not present

## 2021-07-20 DIAGNOSIS — R079 Chest pain, unspecified: Secondary | ICD-10-CM | POA: Diagnosis not present

## 2021-07-20 DIAGNOSIS — R0602 Shortness of breath: Secondary | ICD-10-CM | POA: Diagnosis not present

## 2021-07-20 DIAGNOSIS — I10 Essential (primary) hypertension: Secondary | ICD-10-CM | POA: Diagnosis not present

## 2021-07-20 DIAGNOSIS — Z20822 Contact with and (suspected) exposure to covid-19: Secondary | ICD-10-CM | POA: Diagnosis not present

## 2021-07-20 DIAGNOSIS — R112 Nausea with vomiting, unspecified: Secondary | ICD-10-CM | POA: Diagnosis not present

## 2021-07-20 DIAGNOSIS — E1165 Type 2 diabetes mellitus with hyperglycemia: Secondary | ICD-10-CM | POA: Diagnosis not present

## 2021-07-20 DIAGNOSIS — R000 Tachycardia, unspecified: Secondary | ICD-10-CM | POA: Diagnosis not present

## 2021-07-20 MED ORDER — SPIRIVA RESPIMAT 1.25 MCG/ACT IN AERS: INHALATION_SPRAY | RESPIRATORY_TRACT | 5 refills | Status: DC

## 2021-07-20 NOTE — Progress Notes (Signed)
Follow-up Note  RE: Jennifer [name] MRN: [id number] DOB: [birth year] Date of Office Visit: 07/20/2021   History of present illness: Jennifer [name] is a 55 y.o. female presenting today for sick visit.  She has history of asthma, chronic rhinitis and reflux.  She was last seen in the office on 05/26/2021 by Dr. Simona Huh.  She states this last week she was having more shortness of breath and coughing.  She states sometimes she may cough and "throw up" mucus.  She states with walking up stairs she is getting more short of breath.   She recalls last week that she had to do a lot of cleaning related to having a dog in the home.  She feels like that he may have triggered up symptoms.  She did use xopenex 2 puffs this morning.  She states she tries not to use it.  She does have a flutter valve that she states she uses and usually will be able to cough up a wad of mucus.  She will use mucinex to help with symptom control.  She states steroids worsen her symptoms and elevates her blood sugar and also states causes tachycardia.  She has prednisone and cortisone listed in her allergy medication list.  She states she has not been able to use any inhaled steroid medications.  She states that she was hospitalized she believes they gave her Arnuity (she picked this inhaler out of the inhaler picture chart) and she states it caused her to develop blisters around her mouth.  She is not sure if she has tried Spiriva before.  Upon looking through her chart glipizide has been discussed with her but I do not think she is ever tried use.  She also sees pulmonology at [facility] with last visit on 05/04/21.  Review of systems: Review of Systems  Constitutional: Negative.   HENT: Negative.    Eyes: Negative.   Respiratory: Negative.    Cardiovascular: Negative.   Gastrointestinal: Negative.   Musculoskeletal: Negative.   Skin: Negative.   Allergic/Immunologic: Negative.   Neurological: Negative.     All other  systems negative unless noted above in HPI  Past medical/social/surgical/family history have been reviewed and are unchanged unless specifically indicated below.  No changes  Medication List: Current Outpatient Medications  Medication Sig Dispense Refill   ACCU-CHEK GUIDE test strip For testing blood sugars dailyDx:r73.03 100 each 11   Accu-Chek Softclix Lancets lancets Dx:r73.03 100 each 11   acetaminophen (TYLENOL) 160 MG chewable tablet Chew by mouth.     AMBULATORY NON FORMULARY MEDICATION Medication Name: Wheelchair.  Dx. Multiple sclerosis, lower extremity weakness. 1 Units 0   blood glucose meter kit and supplies Dispense based on patient and insurance preference. Use up to four times daily as directed. R73.03 1 each 0   busPIRone (BUSPAR) 5 MG tablet Take 1 tablet (5 mg total) by mouth 2 (two) times daily. 60 tablet 0   diazepam (VALIUM) 2 MG tablet Take by mouth.     EPINEPHrine 0.3 mg/0.3 mL IJ SOAJ injection SMARTSIG:0.3 Milligram(s) IM Once     famotidine (PEPCID) 20 MG tablet Take by mouth.     flunisolide (NASALIDE) 25 MCG/ACT (0.025%) SOLN Place into the nose.     furosemide (LASIX) 20 MG tablet Take 1 tablet (20 mg total) by mouth daily as needed. 15 tablet 0   guaiFENesin (ROBITUSSIN) 100 MG/5ML liquid Take by mouth.     lactulose (CHRONULAC) 10 GM/15ML solution Take 30  mLs (20 g total) by mouth 2 (two) times daily as needed for mild constipation. 236 mL 2   levalbuterol (XOPENEX HFA) 45 MCG/ACT inhaler INHALE 2 PUFFS INTO THE LUNGS EVERY 6 HOURS AS NEEDED FOR WHEEZING (Patient taking differently: Inhale 1 puff into the lungs daily as needed for wheezing or shortness of breath.) 45 g 0   levalbuterol (XOPENEX) 1.25 MG/3ML nebulizer solution Take 1.25 mg by nebulization every 3 (three) hours as needed for wheezing. 72 mL 1   metoprolol tartrate (LOPRESSOR) 25 MG tablet Take 1 tablet (25 mg total) by mouth in the morning, at noon, and at bedtime. 90 tablet 0   nystatin  (MYCOSTATIN) 100000 UNIT/ML suspension Take 5 mLs by mouth 4 (four) times daily.     potassium chloride 20 MEQ/15ML (10%) SOLN Take 15 mLs (20 mEq total) by mouth daily. 473 mL 2   spironolactone (ALDACTONE) 25 MG tablet TAKE ONE-HALF TO 1 TABLET BY MOUTH DAILY 30 tablet 1   No current facility-administered medications for this visit.     Known medication allergies: Allergies  Allergen Reactions   Azithromycin Shortness Of Breath    Lip swelling, SOB.      Ciprofloxacin Swelling    REACTION: tongue swells   Codeine Shortness Of Breath   Erythromycin Base Itching and Rash   Sulfa Antibiotics Shortness Of Breath, Rash and Other (See Comments)   Sulfasalazine Rash and Shortness Of Breath    Other reaction(s): Other (See Comments) Other reaction(s): SHORTNESS OF BREATH   Telmisartan Swelling    Tongue swelling, Micardis   Ace Inhibitors Cough   Aspirin Hives and Other (See Comments)    flushing   Atenolol Other (See Comments)    Squeezing chest sensation   Avelox [Moxifloxacin Hcl In Nacl] Itching        Beta Adrenergic Blockers Other (See Comments)    Feels like chest tightening labetalol, bystolic  Feels like chest tightening "Metoprolol"    Buspar [Buspirone] Other (See Comments)    Light headed   Butorphanol Tartrate Other (See Comments)    Patient aggitated   Cetirizine Hives and Rash        Clonidine Hcl     REACTION: makes blood pressure high   Cortisone     Feels like she is going crazy   Erythromycin Rash   Fentanyl Other (See Comments)    aggressive    Fluoxetine Hcl Other (See Comments)    REACTION: headaches   Ketorolac Tromethamine     jittery   Lidocaine Other (See Comments)    When it involves the throat,    Lisinopril Cough   Metoclopramide Hcl Other (See Comments)    Dystonic reaction   Midazolam Other (See Comments)    agitation Slow to wake up   Montelukast Other (See Comments)    Singulair   Montelukast Sodium Other (See Comments)    DOES  NOT REMEMBER  Don't remember-told not to take   Naproxen Other (See Comments)    FLUSHING Pt states she took Ibuprofen today (10/08/19)   Paroxetine Other (See Comments)    REACTION: headaches   Penicillins Rash   Pravastatin Other (See Comments)    Myalgias   Promethazine Other (See Comments)    Dystonic reaction   Promethazine Hcl Other (See Comments)    jittery   Quinolones Swelling and Rash   Serotonin Reuptake Inhibitors (Ssris) Other (See Comments)    Headache Effexor, prozac, zoloft,    Sertraline Hcl  REACTION: headaches   Stelazine [Trifluoperazine] Other (See Comments)    Dystonic reaction   Tobramycin Itching and Rash   Trifluoperazine Hcl     dystonic   Atrovent Nasal Spray [Ipratropium]     Tachycardia and shaking   Diltiazem Other (See Comments)    Chest pain   Polyethylene Glycol 3350     Other reaction(s): Laryngeal Edema (ALLERGY)   Propoxyphene    Adhesive [Tape] Rash    EKG monitor patches, some tapes Blisters, rash, itching, welts.   Butorphanol Anxiety    Patient agitated   Ceftriaxone Rash    rocephin   Iron Rash    Flushing with certain IV types   Metoclopramide Itching and Other (See Comments)    Dystonic reaction   Metronidazole Rash   Other Rash and Other (See Comments)    Uncoded Allergy. Allergen: steriods, Other Reaction: Not Assessed Other reaction(s): Flushing (ALLERGY/intolerance), GI Upset (intolerance), Hypertension (intolerance), Increased Heart Rate (intolerance), Mental Status Changes (intolerance), Other (See Comments), Tachycardia / Palpitations  (intolerance) Hospital gowns leave a rash.    Prednisone Anxiety and Palpitations   Prochlorperazine Anxiety    Compazine:  Dystonic reaction   Venlafaxine Anxiety   Zyrtec [Cetirizine Hcl] Rash    All over body     Physical examination (limited): Pulse 90, temperature 97.9 F (36.6 C), temperature source Temporal, resp. rate 16, height _0  (1.575 m), weight 226 lb 12.8  oz (102.9 kg), last menstrual period 06/25/2013, SpO2 97 %.  General: Alert, interactive, in no acute distress. Lungs: Clear to auscultation without wheezing, rhonchi or rales. {no increased work of breathing. CV: Normal S1, S2 without murmurs. Abdomen: Nondistended, nontender. Skin: Warm and dry, without lesions or rashes. Extremities:  No clubbing, cyanosis or edema. Neuro:   Grossly intact.  Diagnositics/Labs:  Spirometry: FEV1: 1.97 L 81%, FVC: 2.25 L 74% predicted  Assessment and plan:   Moderate persistent asthma with flare - your lung testing today looked good - I am quite limited in therapies we can use for this exacerbation given her medication allergies - Continue Xopenex hfa 2 puffs every 6 hours as needed for cough, wheeze,tightness in chest, or shortness of breath OR Xopenex 1.25 mg 1 unit dose via nebulizer every 6 hours as needed - Can use guaifenesin twice a day with large glass of water to help thin and mobilize mucus - Discussed Spiriva 1.25 mcg 2 puffs once a day to help with your current symptoms.  I do not see a reason for you to not tolerate this inhaler.  It is not a steroid inhaler or albuterol/xopenex inhaler.   - Continue to follow up with [facility].  - Continue to use aerobika valve 1-2 times a day for 5-10 minutes   Schedule a follow up appointment in 6 months.   I appreciate the opportunity to take part in Jennifer [name]'s care. Please do not hesitate to contact me with questions.  Sincerely,   Prudy Feeler, MD Allergy/Immunology [facility]

## 2021-07-20 NOTE — Patient Instructions (Signed)
Moderate persistent asthma with flare - your lung testing today looked good - Continue Xopenex hfa 2 puffs every 6 hours as needed for cough, wheeze,tightness in chest, or shortness of breath OR Xopenex 1.25 mg 1 unit dose via nebulizer every 6 hours as needed - Can use guaifenesin twice a day with large glass of water to help thin and mobilize mucus - Discussed Spiriva 2 puffs once a day to help with your current symptoms.  I do not see a reason for you to not tolerate this inhaler.  It is not a steroid inhaler or albuterol/xopenex inhaler.   - Continue to follow up with [facility].  - Continue to use aerobika valve 1-2 times a day for 5-10 minutes     Schedule a follow up appointment in 6 months.

## 2021-07-21 ENCOUNTER — Other Ambulatory Visit (HOSPITAL_BASED_OUTPATIENT_CLINIC_OR_DEPARTMENT_OTHER): Payer: Self-pay | Admitting: Family Medicine

## 2021-07-21 ENCOUNTER — Telehealth: Payer: Self-pay

## 2021-07-21 DIAGNOSIS — R000 Tachycardia, unspecified: Secondary | ICD-10-CM | POA: Diagnosis not present

## 2021-07-21 DIAGNOSIS — M25562 Pain in left knee: Secondary | ICD-10-CM

## 2021-07-21 DIAGNOSIS — R079 Chest pain, unspecified: Secondary | ICD-10-CM | POA: Diagnosis not present

## 2021-07-21 MED ORDER — NITROFURANTOIN MONOHYD MACRO 100 MG PO CAPS: 100.0000 mg | ORAL_CAPSULE | Freq: Two times a day (BID) | ORAL | 0 refills | Status: DC

## 2021-07-21 NOTE — Telephone Encounter (Signed)
Meds ordered this encounter  Medications   nitrofurantoin, macrocrystal-monohydrate, (MACROBID) 100 MG capsule    Sig: Take 1 capsule (100 mg total) by mouth 2 (two) times daily.    Dispense:  10 capsule    Refill:  0    

## 2021-07-21 NOTE — Telephone Encounter (Signed)
Pt made aware of RX.  Charyl Bigger, CMA

## 2021-07-21 NOTE — Telephone Encounter (Signed)
Pt was seen in ER last night/early this morning for flank pain and N&V and dx with UTI.  Pt states that they wanted to start her on antibiotics but she declined because she is allergic to so many things.  She would like for Dr. Madilyn Fireman to look over her ER records and see if she really thinks that she needs antibiotics at this time and if so, prescribe something that will be safe for her.  Charyl Bigger, CMA

## 2021-07-25 DIAGNOSIS — Z20822 Contact with and (suspected) exposure to covid-19: Secondary | ICD-10-CM | POA: Diagnosis not present

## 2021-07-25 DIAGNOSIS — J029 Acute pharyngitis, unspecified: Secondary | ICD-10-CM | POA: Diagnosis not present

## 2021-07-25 DIAGNOSIS — M546 Pain in thoracic spine: Secondary | ICD-10-CM | POA: Diagnosis not present

## 2021-07-25 DIAGNOSIS — R051 Acute cough: Secondary | ICD-10-CM | POA: Diagnosis not present

## 2021-07-25 DIAGNOSIS — Z87891 Personal history of nicotine dependence: Secondary | ICD-10-CM | POA: Diagnosis not present

## 2021-07-26 ENCOUNTER — Telehealth (INDEPENDENT_AMBULATORY_CARE_PROVIDER_SITE_OTHER): Payer: Medicare HMO | Admitting: Family Medicine

## 2021-07-26 ENCOUNTER — Encounter: Payer: Self-pay | Admitting: Family Medicine

## 2021-07-26 VITALS — BP 141/86

## 2021-07-26 DIAGNOSIS — M7989 Other specified soft tissue disorders: Secondary | ICD-10-CM | POA: Diagnosis not present

## 2021-07-26 DIAGNOSIS — Z7689 Persons encountering health services in other specified circumstances: Secondary | ICD-10-CM | POA: Diagnosis not present

## 2021-07-26 DIAGNOSIS — K5909 Other constipation: Secondary | ICD-10-CM

## 2021-07-26 DIAGNOSIS — F411 Generalized anxiety disorder: Secondary | ICD-10-CM | POA: Diagnosis not present

## 2021-07-26 DIAGNOSIS — M549 Dorsalgia, unspecified: Secondary | ICD-10-CM

## 2021-07-26 NOTE — Assessment & Plan Note (Signed)
Feels like her bowels are moving a little bit better than they were previously.  Still experiencing flat stools.

## 2021-07-26 NOTE — Assessment & Plan Note (Signed)
Is like she has been doing a little bit better this week she has been getting out of the house and riding around with a friend to do errands.

## 2021-07-26 NOTE — Progress Notes (Signed)
Pt has f/u with cardiology on Thursday  She reports that she has had some discomfort in her back. She stated she has been moving more.   She states that she took 1/2 of the spironolactone and it made her feel funny.   Pt reports that her BS have been elevated lately she has been trying to change her dietary

## 2021-07-26 NOTE — Assessment & Plan Note (Signed)
Go ahead and place referral for nutrition management.  She also has a app that should help her also track calories we discussed that just in general really want her to work on cutting back on carb intake and portion controlling and avoiding sweetened beverages.

## 2021-07-26 NOTE — Progress Notes (Signed)
Virtual Visit via Video Note  I connected with Jennifer [name] on 07/26/21 at  2:00 PM EST by a video enabled telemedicine application and verified that I am speaking with the correct person using two identifiers.   I discussed the limitations of evaluation and management by telemedicine and the availability of in person appointments. The patient expressed understanding and agreed to proceed.  Patient location: in car Provider location: in office  Subjective:    CC:   Chief Complaint  Patient presents with   Follow-up    HPI:  Pt has f/u with cardiology on Thursday.  Unfortunately her son got COVID and then she started not feeling well and so canceled her appointment.  Fortunately it was not COVID and she is feeling a little better but she did have to reschedule and has an appointment with the PA next week.  They will make some adjustments I believe to her loop recorder and she will asked them to need to do an EKG she has been having some intermittent tachycardia but then at other times her pulse has been dropping in the 50s.  She says the swelling in her feet is a little better though not completely gone.  She is been trying to drink more water.   She reports that she has had some discomfort in her back.  Says its in the mid back and between the shoulder blades.  She did go to the ED for it.  She stated she has been moving more.    She states that she took 1/2 of the spironolactone and it made her feel funny.    Pt reports that her BS have been elevated lately she has been trying to change her dietary   She has not started the buspirone yet but says she plans to because she was not feeling well she did not start something new.  Does report that her sugars have been a little bit more elevated recently again.  She does have a watch that can help her start to count calories and says she will bring that in when she comes in next time.  Past medical history, Surgical history, Family  history not pertinant except as noted below, Social history, Allergies, and medications have been entered into the medical record, reviewed, and corrections made.    Objective:    General: Speaking clearly in complete sentences without any shortness of breath.  Alert and oriented x3.  Normal judgment. No apparent acute distress.   Impression and Recommendations:    Problem List Items Addressed This Visit       Digestive   Chronic constipation    Feels like her bowels are moving a little bit better than they were previously.  Still experiencing flat stools.        Other   GAD (generalized anxiety disorder)    Is like she has been doing a little bit better this week she has been getting out of the house and riding around with a friend to do errands.      Encounter for weight management    Go ahead and place referral for nutrition management.  She also has a app that should help her also track calories we discussed that just in general really want her to work on cutting back on carb intake and portion controlling and avoiding sweetened beverages.      Relevant Orders   Amb ref to Medical Nutrition Therapy-MNT   Other Visit Diagnoses  Upper back pain    -  Primary   Swelling of lower extremity          Upper back pain-work on gentle stretches of the shoulders and the back.  Swelling-seems to be a little bit better.  She did take spironolactone once but says she felt a little off afterwards so did not take it again.  Orders Placed This Encounter  Procedures   Amb ref to Medical Nutrition Therapy-MNT    Referral Priority:   Routine    Referral Type:   Consultation    Referral Reason:   Specialty Services Required    Requested Specialty:   Nutrition    Number of Visits Requested:   1     No orders of the defined types were placed in this encounter.    I discussed the assessment and treatment plan with the patient. The patient was provided an opportunity to ask  questions and all were answered. The patient agreed with the plan and demonstrated an understanding of the instructions.   The patient was advised to call back or seek an in-person evaluation if the symptoms worsen or if the condition fails to improve as anticipated.   Beatrice Lecher, MD

## 2021-07-28 DIAGNOSIS — I10 Essential (primary) hypertension: Secondary | ICD-10-CM | POA: Diagnosis not present

## 2021-07-28 DIAGNOSIS — Z95818 Presence of other cardiac implants and grafts: Secondary | ICD-10-CM | POA: Diagnosis not present

## 2021-07-29 ENCOUNTER — Ambulatory Visit: Payer: Medicare HMO | Admitting: Family Medicine

## 2021-07-29 DIAGNOSIS — I10 Essential (primary) hypertension: Secondary | ICD-10-CM | POA: Diagnosis not present

## 2021-07-29 LAB — BASIC METABOLIC PANEL
BUN: 17 (ref 4–21)
CO2: 24 — AB (ref 13–22)
Chloride: 108 (ref 99–108)
Glucose: 152
Sodium: 141 (ref 137–147)

## 2021-07-29 LAB — COMPREHENSIVE METABOLIC PANEL
Calcium: 8.7 (ref 8.7–10.7)
GFR calc non Af Amer: >90

## 2021-07-30 ENCOUNTER — Ambulatory Visit (HOSPITAL_BASED_OUTPATIENT_CLINIC_OR_DEPARTMENT_OTHER): Payer: Medicare HMO | Attending: Family Medicine

## 2021-07-30 ENCOUNTER — Encounter (HOSPITAL_BASED_OUTPATIENT_CLINIC_OR_DEPARTMENT_OTHER): Payer: Self-pay

## 2021-07-30 IMAGING — CR DG CHEST 2V
2 series · 2 of 2 positions shown · non-contrast
Comparison: 05/05/2020

CLINICAL DATA: Shortness of breath for 2 days, history of prior
[id number] positivity

EXAM:
CHEST - 2 VIEW

[w chest lat]
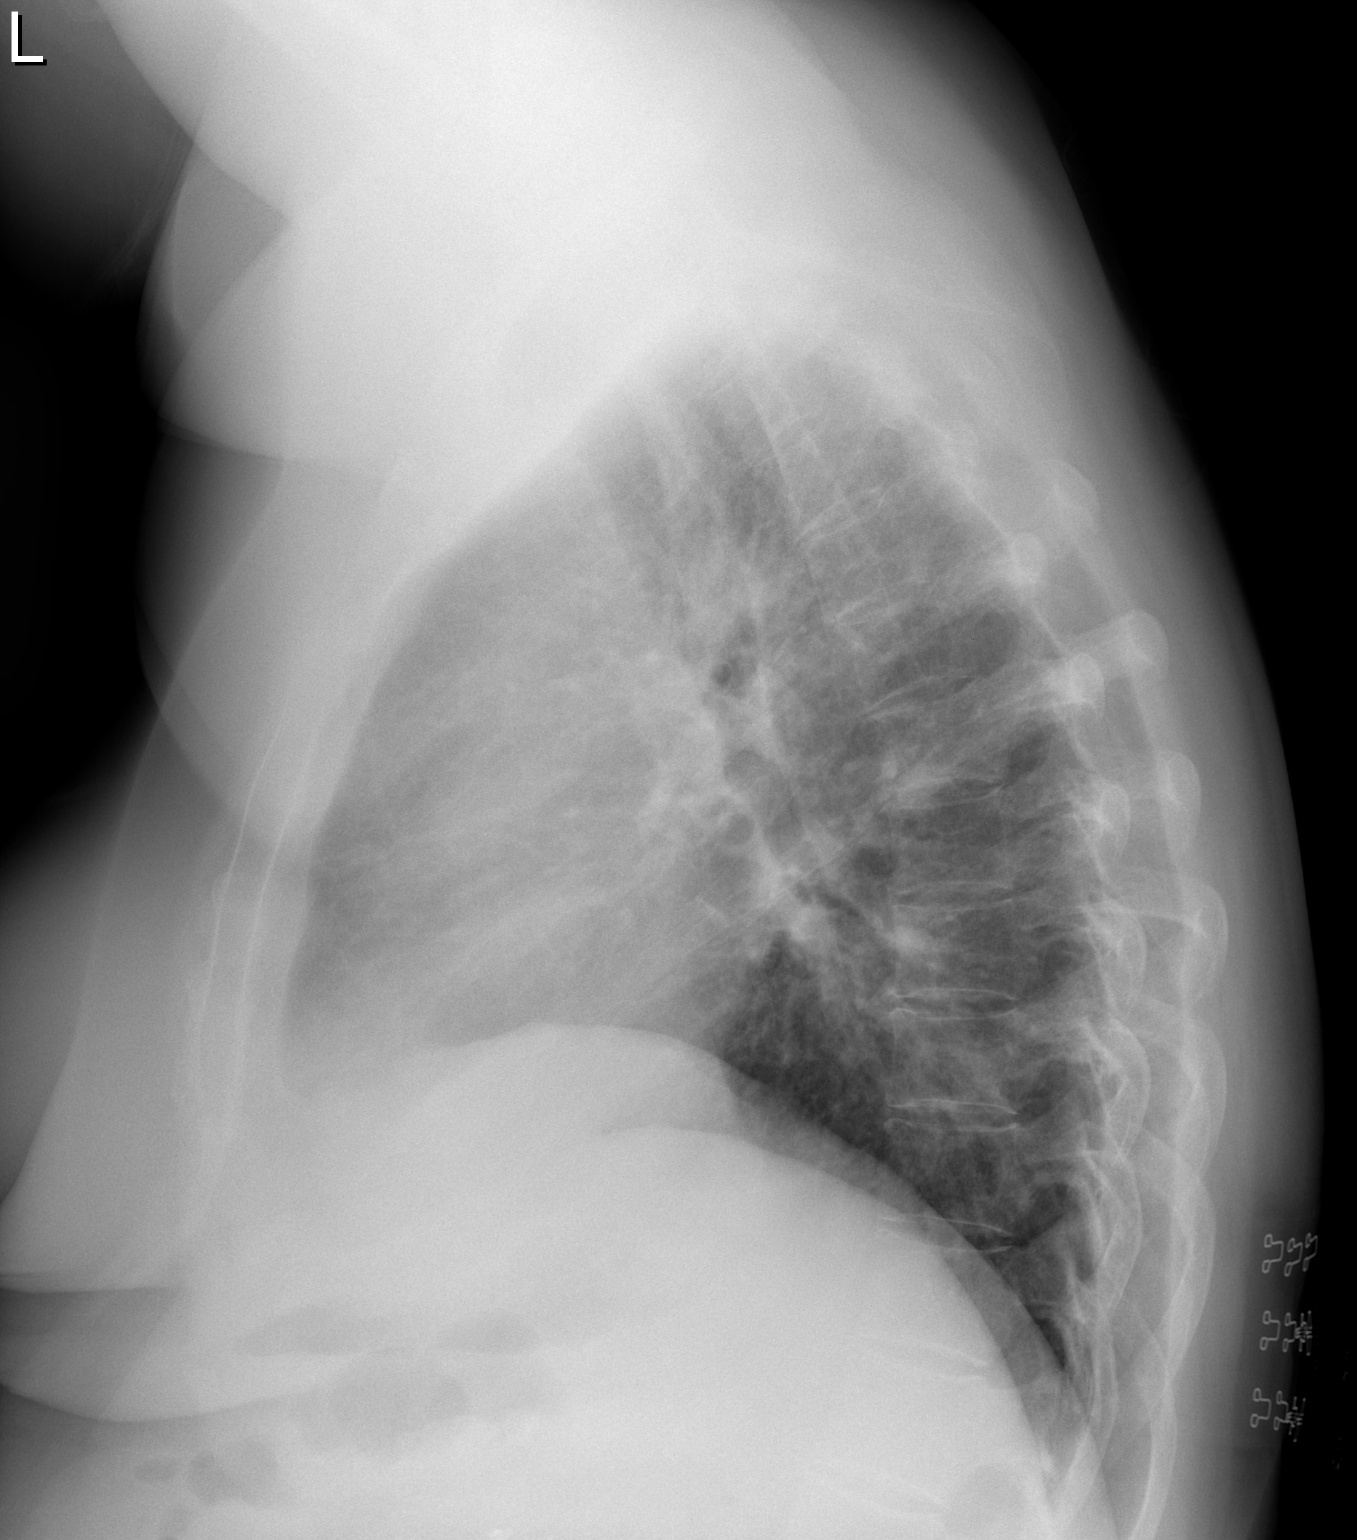

[w chest pa]
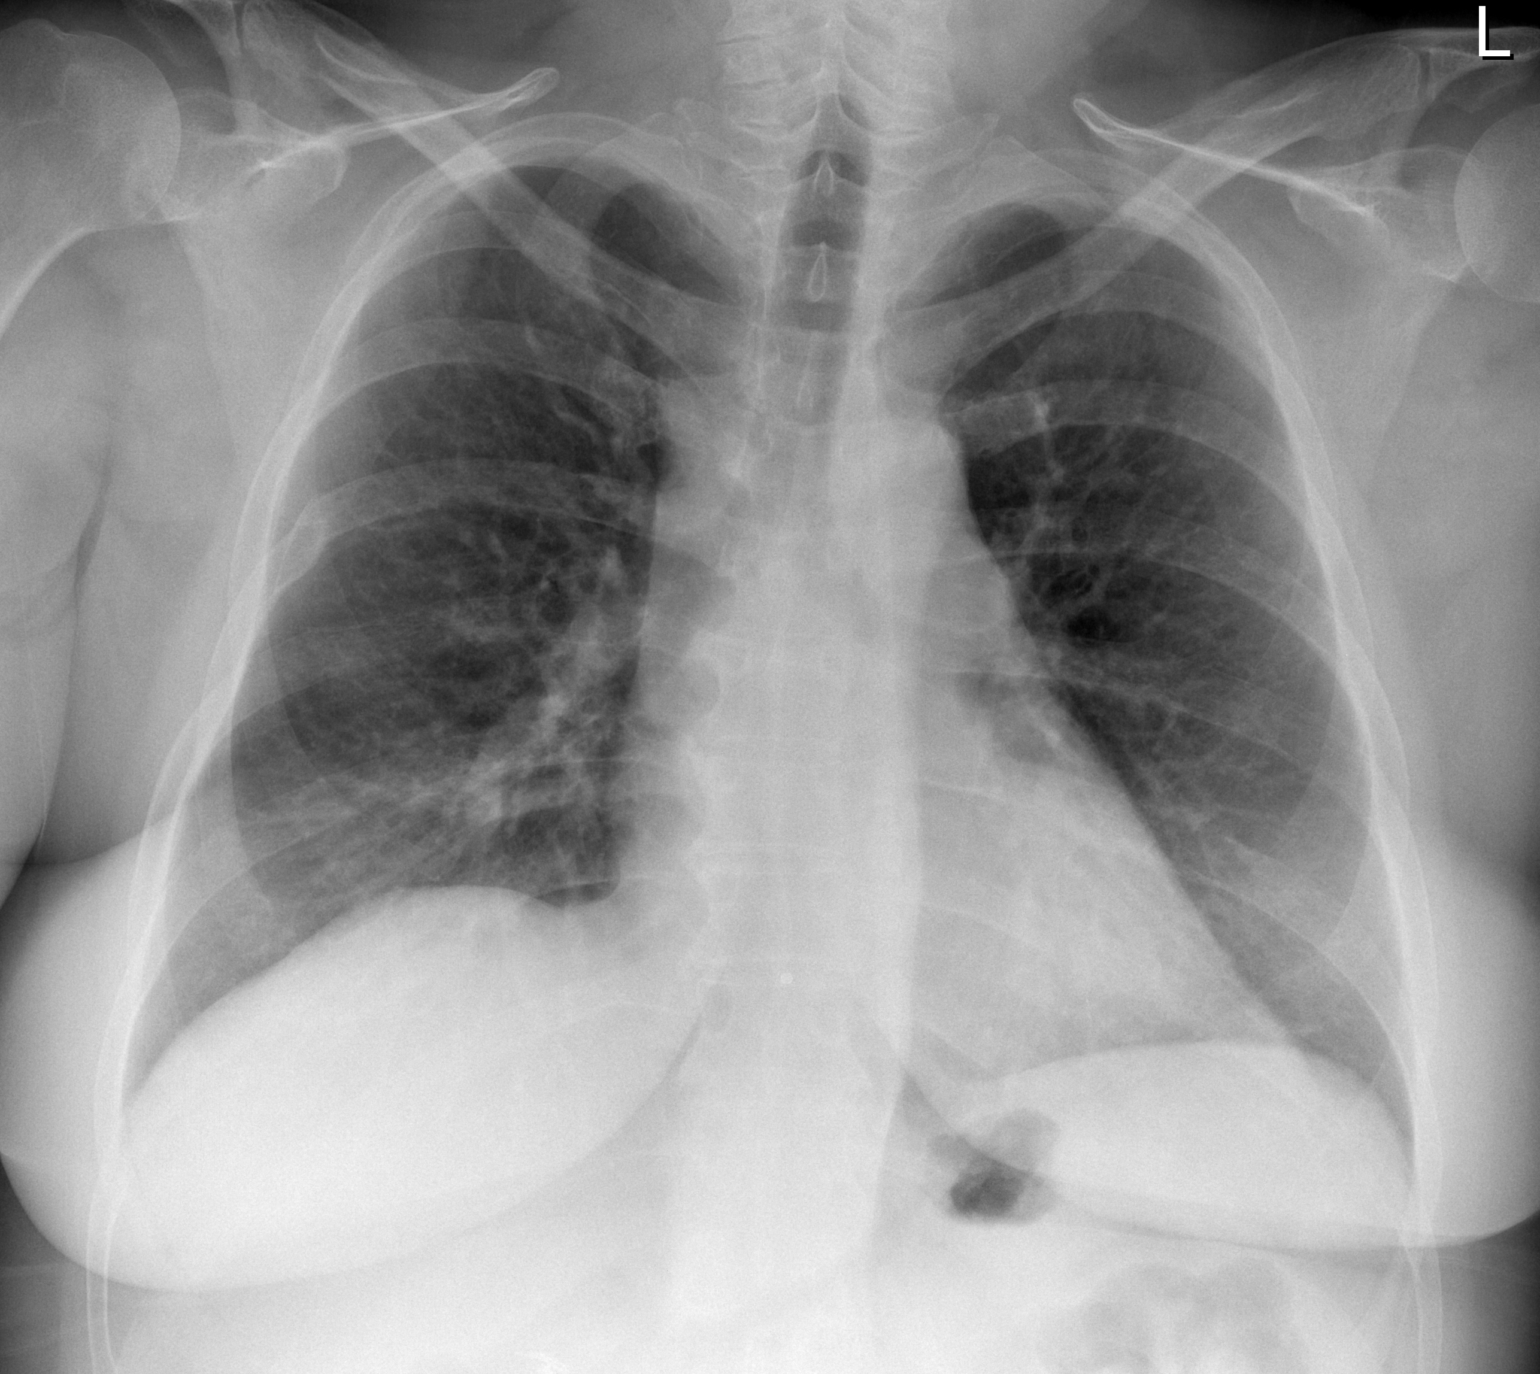

[2 of 2 positions shown; findings below may reference images not displayed]

FINDINGS: Cardiac shadow is within normal limits. The lungs are well aerated
bilaterally. Old rib fracture is noted on the right involving the
sixth rib laterally. No acute bony abnormality is noted.
IMPRESSION: No active cardiopulmonary disease.

## 2021-07-31 DIAGNOSIS — R070 Pain in throat: Secondary | ICD-10-CM | POA: Diagnosis not present

## 2021-07-31 DIAGNOSIS — Z8616 Personal history of COVID-19: Secondary | ICD-10-CM | POA: Diagnosis not present

## 2021-07-31 DIAGNOSIS — R0602 Shortness of breath: Secondary | ICD-10-CM | POA: Diagnosis not present

## 2021-07-31 DIAGNOSIS — R000 Tachycardia, unspecified: Secondary | ICD-10-CM | POA: Diagnosis not present

## 2021-07-31 DIAGNOSIS — R609 Edema, unspecified: Secondary | ICD-10-CM | POA: Diagnosis not present

## 2021-07-31 DIAGNOSIS — Z20822 Contact with and (suspected) exposure to covid-19: Secondary | ICD-10-CM | POA: Diagnosis not present

## 2021-07-31 DIAGNOSIS — J069 Acute upper respiratory infection, unspecified: Secondary | ICD-10-CM | POA: Diagnosis not present

## 2021-07-31 DIAGNOSIS — Z87891 Personal history of nicotine dependence: Secondary | ICD-10-CM | POA: Diagnosis not present

## 2021-07-31 IMAGING — CT CT ANGIO CHEST
2 of 8 series · 19 of 36 positions shown · IV contrast (Omnipaque)
Comparison: 03/21/2020

CLINICAL DATA: Dyspnea

EXAM:
CT ANGIOGRAPHY CHEST WITH CONTRAST
TECHNIQUE: Multidetector CT imaging of the chest was performed using the
standard protocol during bolus administration of intravenous
contrast. Multiplanar CT image reconstructions and MIPs were
obtained to evaluate the vascular anatomy.
CONTRAST:  100mL OMNIPAQUE IOHEXOL 350 MG/ML SOLN

[Series 6: pe coronal mpr · coronal · 0.51mm/px · 1 of 101 slices shown]
[im 51/101  mediastinal]
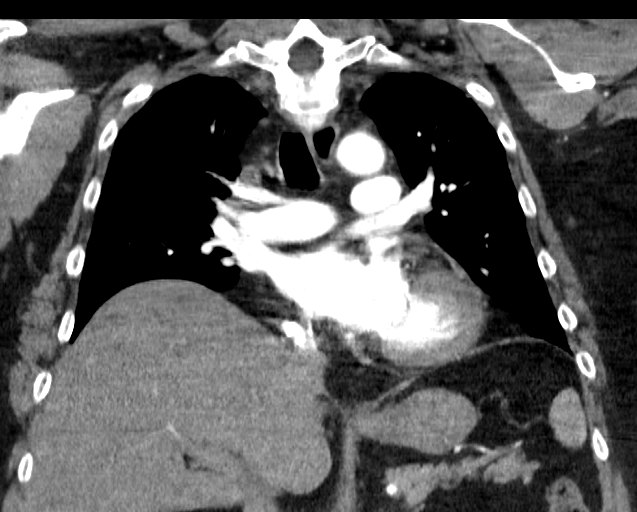

[Series 10: pe thins · axial · 0.63mm/px · z∈[-252,-14]mm · 18 of 353 slices shown]
[im 18/353  lung]
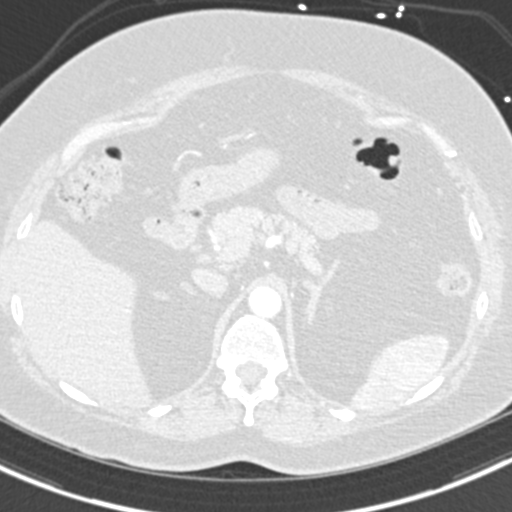
[im 36/353  mediastinal]
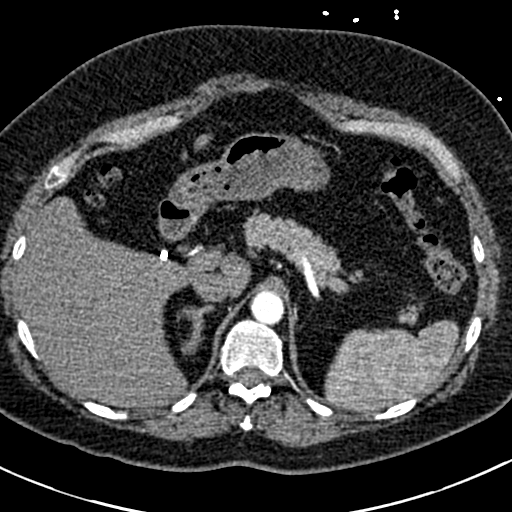
[im 53/353  lung]
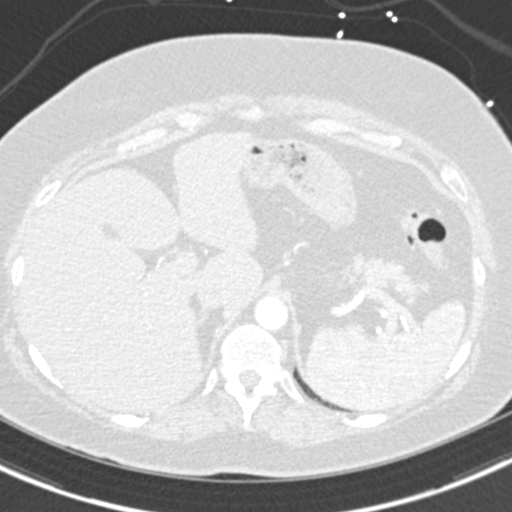
[im 71/353  mediastinal]
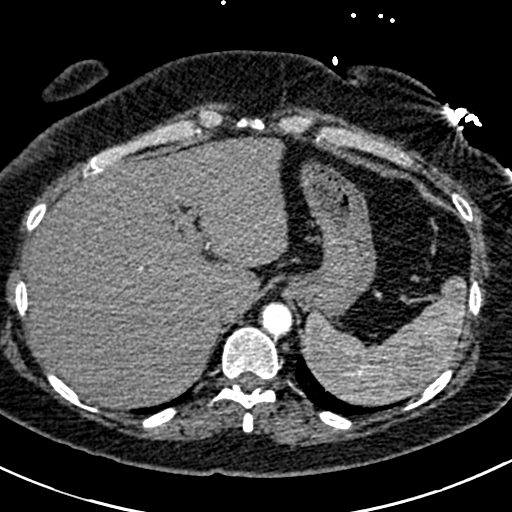
[im 89/353  lung]
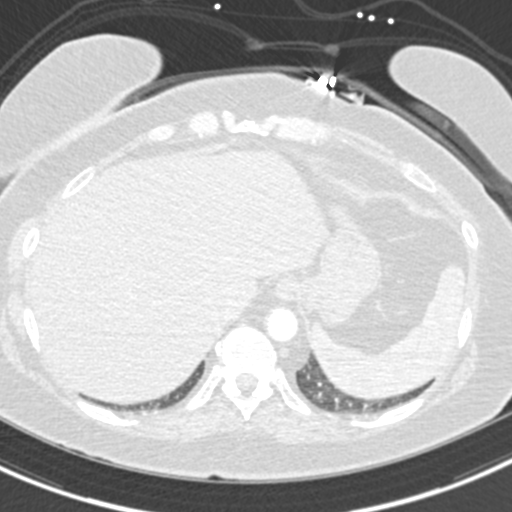
[im 106/353  mediastinal]
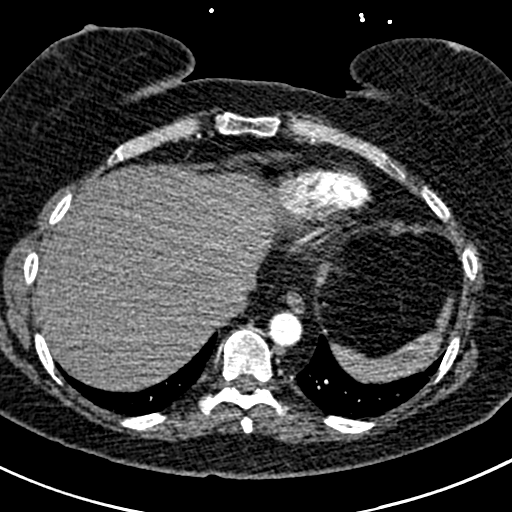
[im 124/353  lung]
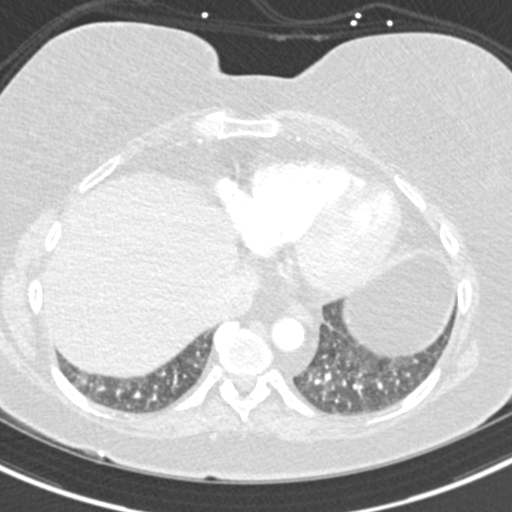
[im 141/353  mediastinal]
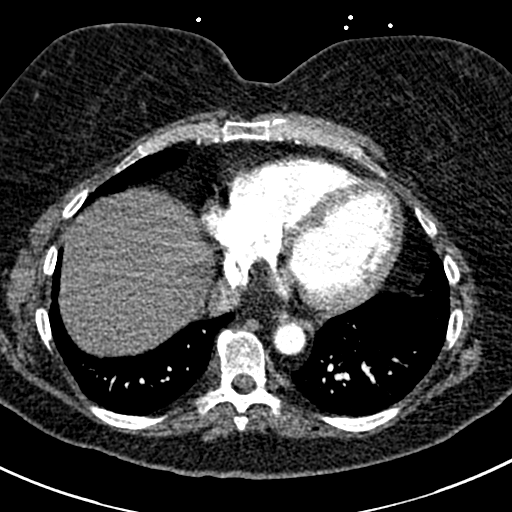
[im 159/353  lung]
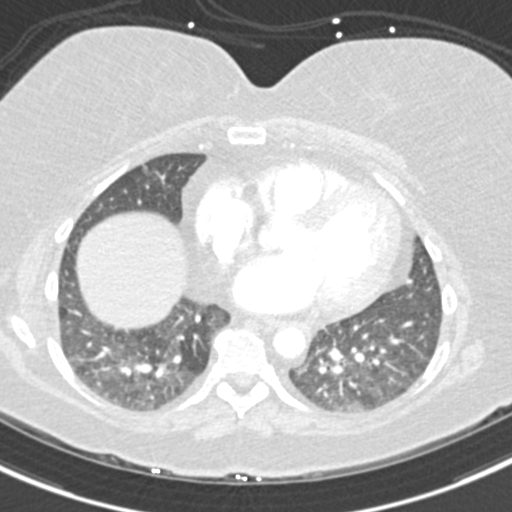
[im 194/353  mediastinal]
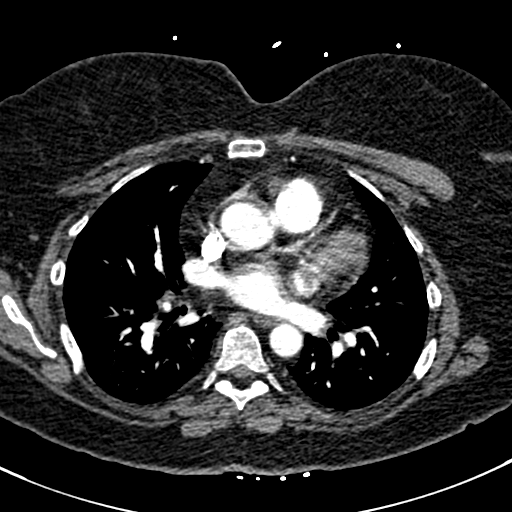
[im 212/353  lung]
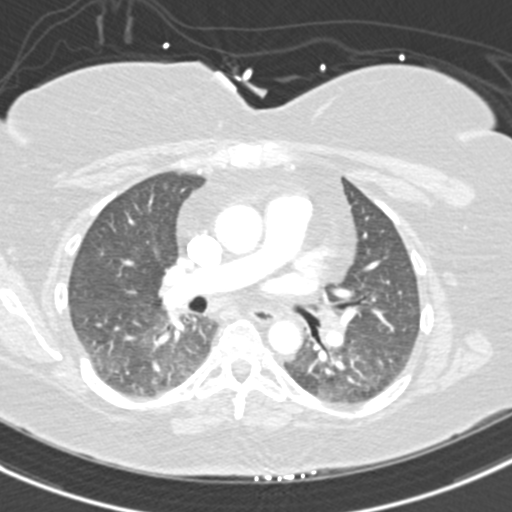
[im 229/353  mediastinal]
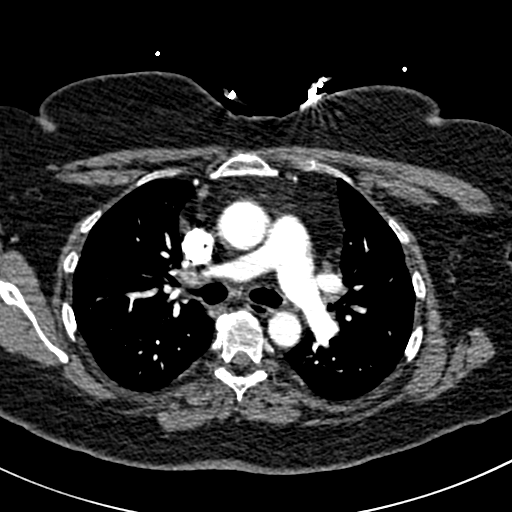
[im 247/353  lung]
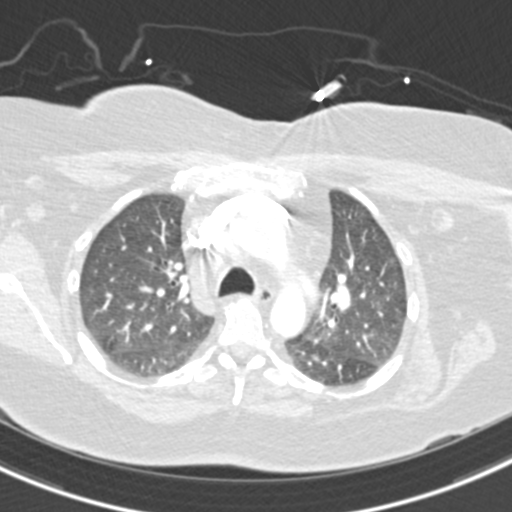
[im 265/353  mediastinal]
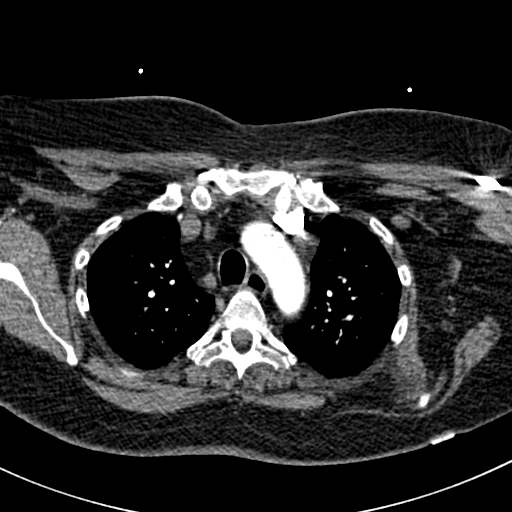
[im 282/353  lung]
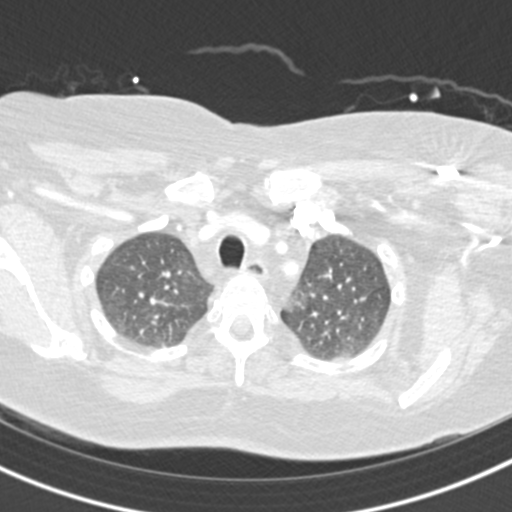
[im 300/353  mediastinal]
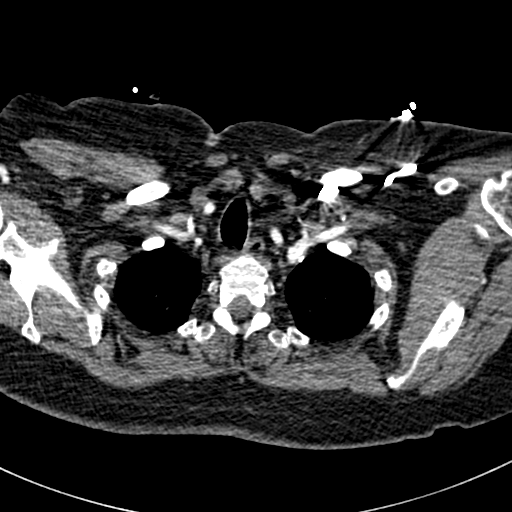
[im 317/353  lung]
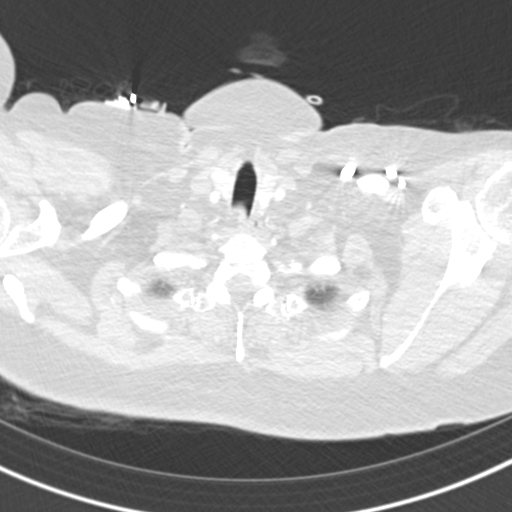
[im 335/353  mediastinal]
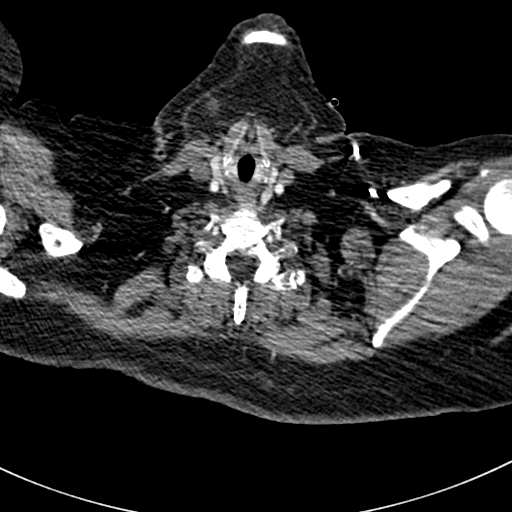

[19 of 36 positions shown; findings below may reference images not displayed]

FINDINGS: Cardiovascular: There is adequate opacification of the pulmonary
arterial tree. No intraluminal filling defect identified to suggest
acute pulmonary embolism. The central pulmonary arteries are of
normal caliber. No significant coronary artery calcification. Global
cardiac size within normal limits. No pericardial effusion. The
thoracic aorta is normal.

Mediastinum/Nodes: No pathologic thoracic adenopathy. Thyroid
unremarkable. Esophagus unremarkable.

Lungs/Pleura: Minimal bibasilar atelectasis. Lungs are otherwise
clear. Scattered areas of lobular air trapping are noted within the
lung bases suggesting changes of small airways disease. No
pneumothorax or pleural effusion. Central airways are widely patent.

Upper Abdomen: Status post cholecystectomy.

Musculoskeletal: No acute bone abnormality.

Review of the MIP images confirms the above findings.
IMPRESSION: No pulmonary embolism.

Scattered areas of lobular air trapping within the lung bases
suggesting changes of small airways disease. No superimposed
confluent pulmonary infiltrate. Central airways are widely patent.

## 2021-08-01 DIAGNOSIS — R0602 Shortness of breath: Secondary | ICD-10-CM | POA: Diagnosis not present

## 2021-08-01 DIAGNOSIS — J029 Acute pharyngitis, unspecified: Secondary | ICD-10-CM | POA: Diagnosis not present

## 2021-08-01 DIAGNOSIS — L539 Erythematous condition, unspecified: Secondary | ICD-10-CM | POA: Diagnosis not present

## 2021-08-01 DIAGNOSIS — Z8542 Personal history of malignant neoplasm of other parts of uterus: Secondary | ICD-10-CM | POA: Diagnosis not present

## 2021-08-01 DIAGNOSIS — K2270 Barrett's esophagus without dysplasia: Secondary | ICD-10-CM | POA: Diagnosis not present

## 2021-08-01 DIAGNOSIS — I10 Essential (primary) hypertension: Secondary | ICD-10-CM | POA: Diagnosis not present

## 2021-08-01 DIAGNOSIS — F419 Anxiety disorder, unspecified: Secondary | ICD-10-CM | POA: Diagnosis not present

## 2021-08-01 DIAGNOSIS — K219 Gastro-esophageal reflux disease without esophagitis: Secondary | ICD-10-CM | POA: Diagnosis not present

## 2021-08-01 DIAGNOSIS — J45909 Unspecified asthma, uncomplicated: Secondary | ICD-10-CM | POA: Diagnosis not present

## 2021-08-01 DIAGNOSIS — J028 Acute pharyngitis due to other specified organisms: Secondary | ICD-10-CM | POA: Diagnosis not present

## 2021-08-01 DIAGNOSIS — K760 Fatty (change of) liver, not elsewhere classified: Secondary | ICD-10-CM | POA: Diagnosis not present

## 2021-08-01 DIAGNOSIS — E118 Type 2 diabetes mellitus with unspecified complications: Secondary | ICD-10-CM | POA: Diagnosis not present

## 2021-08-02 ENCOUNTER — Ambulatory Visit: Payer: Medicare HMO | Admitting: Family Medicine

## 2021-08-02 ENCOUNTER — Telehealth (INDEPENDENT_AMBULATORY_CARE_PROVIDER_SITE_OTHER): Payer: Medicare HMO | Admitting: Family Medicine

## 2021-08-02 ENCOUNTER — Encounter: Payer: Self-pay | Admitting: Family Medicine

## 2021-08-02 ENCOUNTER — Other Ambulatory Visit: Payer: Self-pay

## 2021-08-02 VITALS — BP 140/70

## 2021-08-02 DIAGNOSIS — J069 Acute upper respiratory infection, unspecified: Secondary | ICD-10-CM

## 2021-08-02 DIAGNOSIS — R059 Cough, unspecified: Secondary | ICD-10-CM | POA: Diagnosis not present

## 2021-08-02 DIAGNOSIS — R0981 Nasal congestion: Secondary | ICD-10-CM | POA: Diagnosis not present

## 2021-08-02 DIAGNOSIS — R0602 Shortness of breath: Secondary | ICD-10-CM | POA: Diagnosis not present

## 2021-08-02 DIAGNOSIS — E876 Hypokalemia: Secondary | ICD-10-CM | POA: Diagnosis not present

## 2021-08-02 NOTE — Progress Notes (Signed)
° ° °  Virtual Visit via Video Note  I connected with Jennifer [name] on 08/02/21 at  4:20 PM EST by a video enabled telemedicine application and verified that I am speaking with the correct person using two identifiers.   I discussed the limitations of evaluation and management by telemedicine and the availability of in person appointments. The patient expressed understanding and agreed to proceed.  Patient location: at home Provider location: in office  Subjective:    CC:   Chief Complaint  Patient presents with   Cough    HPI: Cough and ST that started. A couple of days ago  Around granddaughter with nasal congestion and fever.    She was seen on 12/8 - SOB 12/18- SOB, wheezing, neg for COVID.   12/19 - Cough   Now has nasal congestion, sneezing and some cough as well. + ST.  Says her throat felt swollen.  Tonsils are red. Using her nasal rinse and spray. Using Tylenol and Guaifenesin.  No fever.  + chills + halls. Lozenges for her throat. Vomited once. Chest feels tight and sore all over.    BPs were in the 130 until started feeling sick.  Now BPs high.    Past medical history, Surgical history, Family history not pertinant except as noted below, Social history, Allergies, and medications have been entered into the medical record, reviewed, and corrections made.    Objective:    General: Speaking clearly in complete sentences without any shortness of breath.  Alert and oriented x3.  Normal judgment. No apparent acute distress.    Impression and Recommendations:    Problem List Items Addressed This Visit   None Visit Diagnoses     Viral upper respiratory tract infection    -  Primary       Recommend symptomatic care. She has been negative for COVID and flu at the ED.  Discussed can take 7-14 days to improve.    Gave her reassurance about her symptoms.  Her oxygen level is adequate.  Blood pressure seems to have come down a little bit but still elevated.  Okay to use  over-the-counter's just reminded her that they are not can make her feel tremendously better if anything it may just take the edge off of some of her symptoms so she can push through over the next week.  No orders of the defined types were placed in this encounter.   No orders of the defined types were placed in this encounter.    I discussed the assessment and treatment plan with the patient. The patient was provided an opportunity to ask questions and all were answered. The patient agreed with the plan and demonstrated an understanding of the instructions.   The patient was advised to call back or seek an in-person evaluation if the symptoms worsen or if the condition fails to improve as anticipated.  I spent 24 minutes on the day of the encounter to include pre-visit record review, face-to-face time with the patient and post visit ordering of test.   Beatrice Lecher, MD

## 2021-08-02 NOTE — Progress Notes (Signed)
Pt stated she has had some chills, runny nose,pressure behind both of her eyes.   Taking  Guaifenesin, nasal rinses,inhaler.   She was seen at [facility] yesterday.

## 2021-08-03 ENCOUNTER — Encounter: Payer: Self-pay | Admitting: Family Medicine

## 2021-08-03 DIAGNOSIS — R0602 Shortness of breath: Secondary | ICD-10-CM | POA: Diagnosis not present

## 2021-08-04 ENCOUNTER — Other Ambulatory Visit: Payer: Self-pay

## 2021-08-04 ENCOUNTER — Telehealth: Payer: Self-pay | Admitting: Allergy

## 2021-08-04 ENCOUNTER — Ambulatory Visit (INDEPENDENT_AMBULATORY_CARE_PROVIDER_SITE_OTHER): Payer: Medicare HMO | Admitting: Family Medicine

## 2021-08-04 ENCOUNTER — Telehealth: Payer: Medicare HMO | Admitting: Family Medicine

## 2021-08-04 ENCOUNTER — Encounter: Payer: Self-pay | Admitting: Family Medicine

## 2021-08-04 VITALS — BP 151/80 | HR 86 | Ht 62.0 in | Wt 227.0 lb

## 2021-08-04 DIAGNOSIS — J069 Acute upper respiratory infection, unspecified: Secondary | ICD-10-CM

## 2021-08-04 DIAGNOSIS — R0602 Shortness of breath: Secondary | ICD-10-CM | POA: Diagnosis not present

## 2021-08-04 NOTE — Telephone Encounter (Signed)
Pt request a call back, pt states she is having breathing issues.
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
## 2021-08-04 NOTE — Patient Instructions (Addendum)
Use you Xopenex 4 puffs twice a day or use nebulizer treatment twice a day.  Use in AM when she gets up and around evening meal time.    Can use inhaler in the middle of the day if needed.    For Cough - recommend plain Delsym ( dextromethorphan)  syrup twice a day for cough. Use your ice pops as well to sooth your throat.  Run your humidifier as well to moisturize your passes and throat.    Use your Peak Flow meter when your chest feels tight.  Make sure you are standing up and taking a deep breath when you blow.  If you are in the yellow zone then take 4-6 puffs on your inhaler and check your peak flow again in 1 hour.    Can use ice on your neck and chest  Can use Vicks vapor rub on your chest.

## 2021-08-04 NOTE — Telephone Encounter (Signed)
Spoke to Jennifer [name] who went to the [place]. And her PCP this morning instructions by her pcp was to use her xopenex hfa and can get robitussin or delsym cough medicine. Patient will get some vick's vapor rub to put on her chest. Told patient if no better Tuesday to give our office a call.

## 2021-08-06 DIAGNOSIS — R002 Palpitations: Secondary | ICD-10-CM | POA: Diagnosis not present

## 2021-08-06 DIAGNOSIS — E876 Hypokalemia: Secondary | ICD-10-CM | POA: Diagnosis not present

## 2021-08-06 DIAGNOSIS — I10 Essential (primary) hypertension: Secondary | ICD-10-CM | POA: Diagnosis not present

## 2021-08-06 DIAGNOSIS — Z87891 Personal history of nicotine dependence: Secondary | ICD-10-CM | POA: Diagnosis not present

## 2021-08-06 DIAGNOSIS — R9431 Abnormal electrocardiogram [ECG] [EKG]: Secondary | ICD-10-CM | POA: Diagnosis not present

## 2021-08-07 DIAGNOSIS — R9431 Abnormal electrocardiogram [ECG] [EKG]: Secondary | ICD-10-CM | POA: Diagnosis not present

## 2021-08-09 ENCOUNTER — Telehealth: Payer: Self-pay | Admitting: Family Medicine

## 2021-08-09 DIAGNOSIS — J329 Chronic sinusitis, unspecified: Secondary | ICD-10-CM

## 2021-08-09 NOTE — Telephone Encounter (Signed)
Jennifer [name] called and stated that she needs me to resend her Surgery referral but would like it updated to include her arm and leg not just her back.  She was also requesting a referral to [facility] in [place].   [name]

## 2021-08-09 NOTE — Telephone Encounter (Signed)
Refer is ok the way it is. She can show them all the lipomas when she gets there. OK to place referral to [facility].

## 2021-08-10 NOTE — Telephone Encounter (Signed)
LVM informing pt of referral to ENT and regarding the lipomas.  Advised pt to call back with any questions.  Charyl Bigger, CMA

## 2021-08-11 ENCOUNTER — Encounter (HOSPITAL_BASED_OUTPATIENT_CLINIC_OR_DEPARTMENT_OTHER): Payer: Self-pay

## 2021-08-11 ENCOUNTER — Telehealth: Payer: Medicare HMO | Admitting: Family Medicine

## 2021-08-11 ENCOUNTER — Other Ambulatory Visit: Payer: Self-pay

## 2021-08-11 ENCOUNTER — Emergency Department (HOSPITAL_BASED_OUTPATIENT_CLINIC_OR_DEPARTMENT_OTHER)
Admission: EM | Admit: 2021-08-11 | Discharge: 2021-08-11 | Disposition: A | Discharge status: Home / Self Care | Payer: Medicare HMO | Attending: Emergency Medicine | Admitting: Emergency Medicine

## 2021-08-11 ENCOUNTER — Emergency Department (HOSPITAL_BASED_OUTPATIENT_CLINIC_OR_DEPARTMENT_OTHER): Payer: Medicare HMO

## 2021-08-11 DIAGNOSIS — Z87891 Personal history of nicotine dependence: Secondary | ICD-10-CM | POA: Insufficient documentation

## 2021-08-11 DIAGNOSIS — Z8542 Personal history of malignant neoplasm of other parts of uterus: Secondary | ICD-10-CM | POA: Diagnosis not present

## 2021-08-11 DIAGNOSIS — R000 Tachycardia, unspecified: Secondary | ICD-10-CM | POA: Diagnosis not present

## 2021-08-11 DIAGNOSIS — Z8616 Personal history of COVID-19: Secondary | ICD-10-CM | POA: Insufficient documentation

## 2021-08-11 DIAGNOSIS — R079 Chest pain, unspecified: Secondary | ICD-10-CM | POA: Diagnosis present

## 2021-08-11 DIAGNOSIS — R0602 Shortness of breath: Secondary | ICD-10-CM | POA: Diagnosis not present

## 2021-08-11 DIAGNOSIS — J040 Acute laryngitis: Secondary | ICD-10-CM | POA: Diagnosis not present

## 2021-08-11 DIAGNOSIS — Z79899 Other long term (current) drug therapy: Secondary | ICD-10-CM | POA: Insufficient documentation

## 2021-08-11 DIAGNOSIS — R072 Precordial pain: Secondary | ICD-10-CM | POA: Diagnosis not present

## 2021-08-11 DIAGNOSIS — I10 Essential (primary) hypertension: Secondary | ICD-10-CM | POA: Diagnosis not present

## 2021-08-11 DIAGNOSIS — J45909 Unspecified asthma, uncomplicated: Secondary | ICD-10-CM | POA: Diagnosis not present

## 2021-08-11 DIAGNOSIS — R0789 Other chest pain: Secondary | ICD-10-CM | POA: Diagnosis not present

## 2021-08-11 LAB — BASIC METABOLIC PANEL
Anion gap: 9 (ref 5–15)
BUN: 14 mg/dL (ref 6–20)
CO2: 26 mmol/L (ref 22–32)
Calcium: 9.1 mg/dL (ref 8.9–10.3)
Chloride: 103 mmol/L (ref 98–111)
Creatinine, Ser: 0.62 mg/dL (ref 0.44–1.00)
GFR, Estimated: >60 mL/min (ref >60)
Glucose, Bld: 136 mg/dL — ABNORMAL HIGH (ref 70–99)
Potassium: 3.7 mmol/L (ref 3.5–5.1)
Sodium: 138 mmol/L (ref 135–145)

## 2021-08-11 LAB — D-DIMER, QUANTITATIVE: D-Dimer, Quant: 0.38 ug/mL-FEU (ref 0.00–0.50)

## 2021-08-11 LAB — CBC WITH DIFFERENTIAL/PLATELET
Abs Immature Granulocytes: 0.03 10*3/uL (ref 0.00–0.07)
Basophils Absolute: 0.1 10*3/uL (ref 0.0–0.1)
Basophils Relative: 1 %
Eosinophils Absolute: 0.2 10*3/uL (ref 0.0–0.5)
Eosinophils Relative: 2 %
HCT: 43.3 % (ref 36.0–46.0)
Hemoglobin: 14.4 g/dL (ref 12.0–15.0)
Immature Granulocytes: 1 %
Lymphocytes Relative: 20 %
Lymphs Abs: 1.3 10*3/uL (ref 0.7–4.0)
MCH: 30 pg (ref 26.0–34.0)
MCHC: 33.3 g/dL (ref 30.0–36.0)
MCV: 90.2 fL (ref 80.0–100.0)
Monocytes Absolute: 0.5 10*3/uL (ref 0.1–1.0)
Monocytes Relative: 8 %
Neutro Abs: 4.6 10*3/uL (ref 1.7–7.7)
Neutrophils Relative %: 68 %
Platelets: 200 10*3/uL (ref 150–400)
RBC: 4.8 MIL/uL (ref 3.87–5.11)
RDW: 13.5 % (ref 11.5–15.5)
WBC: 6.7 10*3/uL (ref 4.0–10.5)
nRBC: 0 % (ref 0.0–0.2)

## 2021-08-11 LAB — TROPONIN I (HIGH SENSITIVITY)
Troponin I (High Sensitivity): 3 ng/L (ref <18)
Troponin I (High Sensitivity): 3 ng/L (ref <18)

## 2021-08-11 NOTE — ED Triage Notes (Signed)
Pt c/o chest pain since last night. States has been feeling sick the past few weeks and has been tested for covid/flu multiple times recently, been negative.
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
## 2021-08-11 NOTE — ED Notes (Signed)
Patient transported to X-ray 

## 2021-08-11 NOTE — ED Notes (Signed)
Urine sample obtained.

## 2021-08-11 NOTE — Discharge Instructions (Addendum)
You came to the emergency department today to be evaluated for your chest pain and change of voice.  Your physical exam and lab work were reassuring.  Due to your reports of chest pain you will need to follow-up with your cardiologist.  Please call to schedule a follow-up appointment.  Due to reports of muscle cholangitis please follow-up with your primary care provider.  I have given you information to follow-up with a respiratory doctor if your symptoms do not improve.  Get help right away if: Your chest pain gets worse. You have a cough that gets worse, or you cough up blood. You have severe pain in your abdomen. You faint. You have sudden, unexplained chest discomfort. You have sudden, unexplained discomfort in your arms, back, neck, or jaw. You have shortness of breath at any time. You suddenly start to sweat, or your skin gets clammy. You feel nausea or you vomit. You suddenly feel lightheaded or dizzy. You have severe weakness, or unexplained weakness or fatigue. Your heart begins to beat quickly, or it feels like it is skipping beats.

## 2021-08-11 NOTE — ED Notes (Signed)
Attempted IV/blood draw without success. Patient tolerated fairly well. EMT notified need of blood work

## 2021-08-12 NOTE — Telephone Encounter (Signed)
Error

## 2021-08-16 ENCOUNTER — Encounter: Payer: Self-pay | Admitting: Family Medicine

## 2021-08-16 DIAGNOSIS — E119 Type 2 diabetes mellitus without complications: Secondary | ICD-10-CM | POA: Diagnosis not present

## 2021-08-16 DIAGNOSIS — N200 Calculus of kidney: Secondary | ICD-10-CM | POA: Diagnosis not present

## 2021-08-16 DIAGNOSIS — M25462 Effusion, left knee: Secondary | ICD-10-CM | POA: Diagnosis not present

## 2021-08-16 DIAGNOSIS — M7652 Patellar tendinitis, left knee: Secondary | ICD-10-CM | POA: Diagnosis not present

## 2021-08-16 DIAGNOSIS — I700 Atherosclerosis of aorta: Secondary | ICD-10-CM | POA: Diagnosis not present

## 2021-08-16 DIAGNOSIS — D500 Iron deficiency anemia secondary to blood loss (chronic): Secondary | ICD-10-CM | POA: Diagnosis not present

## 2021-08-16 DIAGNOSIS — D649 Anemia, unspecified: Secondary | ICD-10-CM | POA: Diagnosis not present

## 2021-08-16 DIAGNOSIS — M25562 Pain in left knee: Secondary | ICD-10-CM | POA: Diagnosis not present

## 2021-08-16 DIAGNOSIS — R1031 Right lower quadrant pain: Secondary | ICD-10-CM | POA: Diagnosis not present

## 2021-08-16 DIAGNOSIS — R109 Unspecified abdominal pain: Secondary | ICD-10-CM | POA: Diagnosis not present

## 2021-08-17 NOTE — Telephone Encounter (Signed)
Ok to change to 39 m phone visit early next week

## 2021-08-19 ENCOUNTER — Encounter (HOSPITAL_BASED_OUTPATIENT_CLINIC_OR_DEPARTMENT_OTHER): Payer: Self-pay | Admitting: *Deleted

## 2021-08-19 ENCOUNTER — Emergency Department (HOSPITAL_BASED_OUTPATIENT_CLINIC_OR_DEPARTMENT_OTHER)
Admission: EM | Admit: 2021-08-19 | Discharge: 2021-08-20 | Disposition: A | Discharge status: Home / Self Care | Payer: Medicare HMO | Attending: Emergency Medicine | Admitting: Emergency Medicine

## 2021-08-19 ENCOUNTER — Other Ambulatory Visit: Payer: Self-pay

## 2021-08-19 ENCOUNTER — Ambulatory Visit: Payer: Medicare HMO | Admitting: Family Medicine

## 2021-08-19 DIAGNOSIS — R109 Unspecified abdominal pain: Secondary | ICD-10-CM | POA: Diagnosis not present

## 2021-08-19 DIAGNOSIS — Z87891 Personal history of nicotine dependence: Secondary | ICD-10-CM | POA: Insufficient documentation

## 2021-08-19 DIAGNOSIS — J45909 Unspecified asthma, uncomplicated: Secondary | ICD-10-CM | POA: Diagnosis not present

## 2021-08-19 DIAGNOSIS — T887XXA Unspecified adverse effect of drug or medicament, initial encounter: Secondary | ICD-10-CM | POA: Diagnosis not present

## 2021-08-19 DIAGNOSIS — T463X5A Adverse effect of coronary vasodilators, initial encounter: Secondary | ICD-10-CM | POA: Insufficient documentation

## 2021-08-19 DIAGNOSIS — T50905A Adverse effect of unspecified drugs, medicaments and biological substances, initial encounter: Secondary | ICD-10-CM

## 2021-08-19 DIAGNOSIS — R0789 Other chest pain: Secondary | ICD-10-CM | POA: Diagnosis not present

## 2021-08-19 DIAGNOSIS — I10 Essential (primary) hypertension: Secondary | ICD-10-CM | POA: Diagnosis not present

## 2021-08-19 DIAGNOSIS — Z8542 Personal history of malignant neoplasm of other parts of uterus: Secondary | ICD-10-CM | POA: Insufficient documentation

## 2021-08-19 DIAGNOSIS — R9431 Abnormal electrocardiogram [ECG] [EKG]: Secondary | ICD-10-CM | POA: Diagnosis not present

## 2021-08-19 DIAGNOSIS — Z79899 Other long term (current) drug therapy: Secondary | ICD-10-CM | POA: Insufficient documentation

## 2021-08-19 DIAGNOSIS — R079 Chest pain, unspecified: Secondary | ICD-10-CM | POA: Diagnosis not present

## 2021-08-19 DIAGNOSIS — Z5321 Procedure and treatment not carried out due to patient leaving prior to being seen by health care provider: Secondary | ICD-10-CM | POA: Diagnosis not present

## 2021-08-19 DIAGNOSIS — R000 Tachycardia, unspecified: Secondary | ICD-10-CM | POA: Diagnosis not present

## 2021-08-19 DIAGNOSIS — R209 Unspecified disturbances of skin sensation: Secondary | ICD-10-CM | POA: Diagnosis not present

## 2021-08-19 DIAGNOSIS — R0602 Shortness of breath: Secondary | ICD-10-CM | POA: Diagnosis not present

## 2021-08-19 DIAGNOSIS — K219 Gastro-esophageal reflux disease without esophagitis: Secondary | ICD-10-CM | POA: Diagnosis not present

## 2021-08-19 DIAGNOSIS — M79602 Pain in left arm: Secondary | ICD-10-CM | POA: Diagnosis not present

## 2021-08-19 NOTE — ED Triage Notes (Signed)
Sob since yesterday.

## 2021-08-20 LAB — CBC WITH DIFFERENTIAL/PLATELET
Abs Immature Granulocytes: 0.02 10*3/uL (ref 0.00–0.07)
Basophils Absolute: 0 10*3/uL (ref 0.0–0.1)
Basophils Relative: 1 %
Eosinophils Absolute: 0.2 10*3/uL (ref 0.0–0.5)
Eosinophils Relative: 2 %
HCT: 45.5 % (ref 36.0–46.0)
Hemoglobin: 14.9 g/dL (ref 12.0–15.0)
Immature Granulocytes: 0 %
Lymphocytes Relative: 24 %
Lymphs Abs: 1.9 10*3/uL (ref 0.7–4.0)
MCH: 29.9 pg (ref 26.0–34.0)
MCHC: 32.7 g/dL (ref 30.0–36.0)
MCV: 91.4 fL (ref 80.0–100.0)
Monocytes Absolute: 0.9 10*3/uL (ref 0.1–1.0)
Monocytes Relative: 11 %
Neutro Abs: 4.8 10*3/uL (ref 1.7–7.7)
Neutrophils Relative %: 62 %
Platelets: 204 10*3/uL (ref 150–400)
RBC: 4.98 MIL/uL (ref 3.87–5.11)
RDW: 13.5 % (ref 11.5–15.5)
WBC: 7.7 10*3/uL (ref 4.0–10.5)
nRBC: 0 % (ref 0.0–0.2)

## 2021-08-20 LAB — COMPREHENSIVE METABOLIC PANEL
ALT: 22 U/L (ref 0–44)
AST: 16 U/L (ref 15–41)
Albumin: 3.8 g/dL (ref 3.5–5.0)
Alkaline Phosphatase: 73 U/L (ref 38–126)
Anion gap: 8 (ref 5–15)
BUN: 14 mg/dL (ref 6–20)
CO2: 24 mmol/L (ref 22–32)
Calcium: 8.8 mg/dL — ABNORMAL LOW (ref 8.9–10.3)
Chloride: 109 mmol/L (ref 98–111)
Creatinine, Ser: 0.57 mg/dL (ref 0.44–1.00)
GFR, Estimated: >60 mL/min (ref >60)
Glucose, Bld: 138 mg/dL — ABNORMAL HIGH (ref 70–99)
Potassium: 3.6 mmol/L (ref 3.5–5.1)
Sodium: 141 mmol/L (ref 135–145)
Total Bilirubin: 0.4 mg/dL (ref 0.3–1.2)
Total Protein: 7 g/dL (ref 6.5–8.1)

## 2021-08-20 LAB — LIPASE, BLOOD: Lipase: 39 U/L (ref 11–51)
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
## 2021-08-23 DIAGNOSIS — R0789 Other chest pain: Secondary | ICD-10-CM | POA: Diagnosis not present

## 2021-08-23 DIAGNOSIS — R42 Dizziness and giddiness: Secondary | ICD-10-CM | POA: Diagnosis not present

## 2021-08-23 DIAGNOSIS — M79642 Pain in left hand: Secondary | ICD-10-CM | POA: Diagnosis not present

## 2021-08-23 DIAGNOSIS — R002 Palpitations: Secondary | ICD-10-CM | POA: Diagnosis not present

## 2021-08-23 DIAGNOSIS — R9431 Abnormal electrocardiogram [ECG] [EKG]: Secondary | ICD-10-CM | POA: Diagnosis not present

## 2021-08-23 DIAGNOSIS — Z72820 Sleep deprivation: Secondary | ICD-10-CM | POA: Diagnosis not present

## 2021-08-23 DIAGNOSIS — M79602 Pain in left arm: Secondary | ICD-10-CM | POA: Diagnosis not present

## 2021-08-23 DIAGNOSIS — R000 Tachycardia, unspecified: Secondary | ICD-10-CM | POA: Diagnosis not present

## 2021-08-26 ENCOUNTER — Other Ambulatory Visit: Payer: Self-pay

## 2021-08-26 ENCOUNTER — Emergency Department (HOSPITAL_BASED_OUTPATIENT_CLINIC_OR_DEPARTMENT_OTHER)
Admission: EM | Admit: 2021-08-26 | Discharge: 2021-08-26 | Disposition: A | Discharge status: Home / Self Care | Payer: Medicare HMO | Attending: Emergency Medicine | Admitting: Emergency Medicine

## 2021-08-26 ENCOUNTER — Emergency Department (HOSPITAL_BASED_OUTPATIENT_CLINIC_OR_DEPARTMENT_OTHER): Payer: Medicare HMO

## 2021-08-26 ENCOUNTER — Encounter (HOSPITAL_BASED_OUTPATIENT_CLINIC_OR_DEPARTMENT_OTHER): Payer: Self-pay

## 2021-08-26 DIAGNOSIS — Z79899 Other long term (current) drug therapy: Secondary | ICD-10-CM | POA: Diagnosis not present

## 2021-08-26 DIAGNOSIS — M79672 Pain in left foot: Secondary | ICD-10-CM | POA: Diagnosis not present

## 2021-08-26 DIAGNOSIS — R1084 Generalized abdominal pain: Secondary | ICD-10-CM | POA: Insufficient documentation

## 2021-08-26 DIAGNOSIS — M79605 Pain in left leg: Secondary | ICD-10-CM | POA: Diagnosis not present

## 2021-08-26 DIAGNOSIS — Z8616 Personal history of COVID-19: Secondary | ICD-10-CM | POA: Insufficient documentation

## 2021-08-26 DIAGNOSIS — I10 Essential (primary) hypertension: Secondary | ICD-10-CM | POA: Insufficient documentation

## 2021-08-26 DIAGNOSIS — Z8541 Personal history of malignant neoplasm of cervix uteri: Secondary | ICD-10-CM | POA: Diagnosis not present

## 2021-08-26 DIAGNOSIS — J45909 Unspecified asthma, uncomplicated: Secondary | ICD-10-CM | POA: Diagnosis not present

## 2021-08-26 DIAGNOSIS — R1013 Epigastric pain: Secondary | ICD-10-CM | POA: Diagnosis present

## 2021-08-26 LAB — URINALYSIS, ROUTINE W REFLEX MICROSCOPIC
Bilirubin Urine: NEGATIVE
Glucose, UA: NEGATIVE mg/dL
Hgb urine dipstick: NEGATIVE
Ketones, ur: NEGATIVE mg/dL
Leukocytes,Ua: NEGATIVE
Nitrite: NEGATIVE
Protein, ur: NEGATIVE mg/dL
Specific Gravity, Urine: >=1.03 (ref 1.005–1.030)
pH: 6 (ref 5.0–8.0)

## 2021-08-26 LAB — CBC WITH DIFFERENTIAL/PLATELET
Abs Immature Granulocytes: 0.02 10*3/uL (ref 0.00–0.07)
Basophils Absolute: 0 10*3/uL (ref 0.0–0.1)
Basophils Relative: 1 %
Eosinophils Absolute: 0.2 10*3/uL (ref 0.0–0.5)
Eosinophils Relative: 3 %
HCT: 40.6 % (ref 36.0–46.0)
Hemoglobin: 13.8 g/dL (ref 12.0–15.0)
Immature Granulocytes: 0 %
Lymphocytes Relative: 22 %
Lymphs Abs: 1.6 10*3/uL (ref 0.7–4.0)
MCH: 30.5 pg (ref 26.0–34.0)
MCHC: 34 g/dL (ref 30.0–36.0)
MCV: 89.8 fL (ref 80.0–100.0)
Monocytes Absolute: 0.6 10*3/uL (ref 0.1–1.0)
Monocytes Relative: 9 %
Neutro Abs: 4.9 10*3/uL (ref 1.7–7.7)
Neutrophils Relative %: 65 %
Platelets: 188 10*3/uL (ref 150–400)
RBC: 4.52 MIL/uL (ref 3.87–5.11)
RDW: 13.5 % (ref 11.5–15.5)
WBC: 7.4 10*3/uL (ref 4.0–10.5)
nRBC: 0 % (ref 0.0–0.2)

## 2021-08-26 LAB — COMPREHENSIVE METABOLIC PANEL
ALT: 18 U/L (ref 0–44)
AST: 15 U/L (ref 15–41)
Albumin: 3.5 g/dL (ref 3.5–5.0)
Alkaline Phosphatase: 68 U/L (ref 38–126)
Anion gap: 7 (ref 5–15)
BUN: 18 mg/dL (ref 6–20)
CO2: 24 mmol/L (ref 22–32)
Calcium: 8.9 mg/dL (ref 8.9–10.3)
Chloride: 110 mmol/L (ref 98–111)
Creatinine, Ser: 0.58 mg/dL (ref 0.44–1.00)
GFR, Estimated: >60 mL/min (ref >60)
Glucose, Bld: 107 mg/dL — ABNORMAL HIGH (ref 70–99)
Potassium: 3.7 mmol/L (ref 3.5–5.1)
Sodium: 141 mmol/L (ref 135–145)
Total Bilirubin: 0.4 mg/dL (ref 0.3–1.2)
Total Protein: 6.6 g/dL (ref 6.5–8.1)

## 2021-08-26 LAB — TROPONIN I (HIGH SENSITIVITY): Troponin I (High Sensitivity): 3 ng/L (ref <18)

## 2021-08-26 LAB — LIPASE, BLOOD: Lipase: 41 U/L (ref 11–51)

## 2021-08-26 NOTE — Discharge Instructions (Addendum)
Please follow up with your primary care doctor and your GI specialist.

## 2021-08-26 NOTE — ED Triage Notes (Signed)
Pt c/o abd pain/swelling to legs since she was seen here 1/6-NAD-steady gait
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
## 2021-08-27 DIAGNOSIS — R102 Pelvic and perineal pain: Secondary | ICD-10-CM | POA: Diagnosis not present

## 2021-08-27 DIAGNOSIS — R569 Unspecified convulsions: Secondary | ICD-10-CM | POA: Diagnosis not present

## 2021-08-27 DIAGNOSIS — G35 Multiple sclerosis: Secondary | ICD-10-CM | POA: Diagnosis not present

## 2021-08-27 DIAGNOSIS — Z8542 Personal history of malignant neoplasm of other parts of uterus: Secondary | ICD-10-CM | POA: Diagnosis not present

## 2021-08-27 DIAGNOSIS — R1031 Right lower quadrant pain: Secondary | ICD-10-CM | POA: Diagnosis not present

## 2021-08-27 DIAGNOSIS — J45909 Unspecified asthma, uncomplicated: Secondary | ICD-10-CM | POA: Diagnosis not present

## 2021-08-27 DIAGNOSIS — R10817 Generalized abdominal tenderness: Secondary | ICD-10-CM | POA: Diagnosis not present

## 2021-08-27 DIAGNOSIS — R7989 Other specified abnormal findings of blood chemistry: Secondary | ICD-10-CM | POA: Diagnosis not present

## 2021-08-27 DIAGNOSIS — I10 Essential (primary) hypertension: Secondary | ICD-10-CM | POA: Diagnosis not present

## 2021-08-27 DIAGNOSIS — F419 Anxiety disorder, unspecified: Secondary | ICD-10-CM | POA: Diagnosis not present

## 2021-08-29 ENCOUNTER — Other Ambulatory Visit: Payer: Self-pay

## 2021-08-29 ENCOUNTER — Encounter: Payer: Self-pay | Admitting: Family Medicine

## 2021-08-29 ENCOUNTER — Ambulatory Visit (INDEPENDENT_AMBULATORY_CARE_PROVIDER_SITE_OTHER): Payer: Medicare HMO | Admitting: Family Medicine

## 2021-08-29 VITALS — BP 182/73 | HR 87 | Resp 18 | Ht 62.0 in | Wt 230.0 lb

## 2021-08-29 DIAGNOSIS — R1013 Epigastric pain: Secondary | ICD-10-CM | POA: Diagnosis not present

## 2021-08-29 DIAGNOSIS — R1010 Upper abdominal pain, unspecified: Secondary | ICD-10-CM | POA: Diagnosis not present

## 2021-08-29 DIAGNOSIS — D510 Vitamin B12 deficiency anemia due to intrinsic factor deficiency: Secondary | ICD-10-CM | POA: Diagnosis not present

## 2021-08-29 DIAGNOSIS — M797 Fibromyalgia: Secondary | ICD-10-CM

## 2021-08-29 DIAGNOSIS — M79671 Pain in right foot: Secondary | ICD-10-CM | POA: Diagnosis not present

## 2021-08-29 DIAGNOSIS — J069 Acute upper respiratory infection, unspecified: Secondary | ICD-10-CM

## 2021-08-29 DIAGNOSIS — M79672 Pain in left foot: Secondary | ICD-10-CM | POA: Diagnosis not present

## 2021-08-29 DIAGNOSIS — N760 Acute vaginitis: Secondary | ICD-10-CM | POA: Diagnosis not present

## 2021-08-29 MED ORDER — CYANOCOBALAMIN 1000 MCG/ML IJ SOLN
1000.0000 ug | Freq: Once | INTRAMUSCULAR | Status: AC
  Administered 2021-08-29: 1000 ug via INTRAMUSCULAR

## 2021-08-29 NOTE — Assessment & Plan Note (Signed)
Gust that I think a lot of her generalized symptoms including her abdominal pain, upper chest pain, left axillary discomfort, leg pain knee pain and foot pain is related to fibromyalgia flare she is also got concomitant fatigue and weakness going on.  I gave her some additional handout and reading on this I am certainly happy to discuss treatment options if she is open to that at some point but it is more of a long game treatment versus being able to just treat the acute symptoms.

## 2021-08-29 NOTE — Assessment & Plan Note (Signed)
Discussed increasing her H2 blocker to twice a day and also taking a stool softener to get her bowels moving.  She is not better by the end of the week and please let us know.

## 2021-08-29 NOTE — Patient Instructions (Signed)
Increase famotidine to twice a day.  Take 1 about 20 minutes before breakfast and the second 1 closer to bedtime. We will call you with the sure swab results. Encourage you to start using your stool softeners again to keep your bowels moving. Continue to run your humidifier and use your nasal saline for your upper respiratory symptoms.
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
## 2021-08-29 NOTE — Assessment & Plan Note (Signed)
12 injection given today.

## 2021-08-30 ENCOUNTER — Encounter: Payer: Self-pay | Admitting: Family Medicine

## 2021-08-30 LAB — SURESWAB® ADVANCED VAGINITIS PLUS,TMA
C. trachomatis RNA, TMA: NOT DETECTED
CANDIDA SPECIES: DETECTED — AB
Candida glabrata: NOT DETECTED
N. gonorrhoeae RNA, TMA: NOT DETECTED
SURESWAB(R) ADV BACTERIAL VAGINOSIS(BV),TMA: NEGATIVE
TRICHOMONAS VAGINALIS (TV),TMA: NOT DETECTED

## 2021-08-30 MED ORDER — FLUCONAZOLE 150 MG PO TABS: 150.0000 mg | ORAL_TABLET | Freq: Once | ORAL | 1 refills | Status: AC

## 2021-08-30 NOTE — Progress Notes (Signed)
HI Jennifer [name], your swab showed yeast.  I will send over for a prescription for Diflucan to clear that up.

## 2021-08-30 NOTE — Addendum Note (Signed)
Addended by: Beatrice Lecher D on: 08/30/2021 12:44 PM   Modules accepted: Orders

## 2021-08-31 DIAGNOSIS — M5441 Lumbago with sciatica, right side: Secondary | ICD-10-CM | POA: Diagnosis not present

## 2021-08-31 DIAGNOSIS — M5442 Lumbago with sciatica, left side: Secondary | ICD-10-CM | POA: Diagnosis not present

## 2021-08-31 NOTE — Telephone Encounter (Signed)
There on several OTC vaginal products like Monistat that work well.  I would recommend one of those.

## 2021-09-02 ENCOUNTER — Encounter: Payer: Self-pay | Admitting: Family Medicine

## 2021-09-02 ENCOUNTER — Ambulatory Visit (INDEPENDENT_AMBULATORY_CARE_PROVIDER_SITE_OTHER): Payer: Medicare HMO | Admitting: Family Medicine

## 2021-09-02 ENCOUNTER — Other Ambulatory Visit: Payer: Self-pay

## 2021-09-02 VITALS — BP 155/79 | HR 91 | Resp 18 | Ht 62.0 in | Wt 230.0 lb

## 2021-09-02 DIAGNOSIS — M5412 Radiculopathy, cervical region: Secondary | ICD-10-CM

## 2021-09-02 DIAGNOSIS — D510 Vitamin B12 deficiency anemia due to intrinsic factor deficiency: Secondary | ICD-10-CM | POA: Diagnosis not present

## 2021-09-02 DIAGNOSIS — M797 Fibromyalgia: Secondary | ICD-10-CM | POA: Diagnosis not present

## 2021-09-02 MED ORDER — METAXALONE 400 MG PO TABS: 400.0000 mg | ORAL_TABLET | Freq: Three times a day (TID) | ORAL | 0 refills | Status: DC | PRN
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
## 2021-09-05 NOTE — Assessment & Plan Note (Signed)
Willing a little better after recent B12 injection but not completely better.  We did seem to make sure we get her back on a routine for the injections monthly.

## 2021-09-05 NOTE — Assessment & Plan Note (Signed)
I do think a lot of her generalized body aches are related to her fibromyalgia.I did have take a form to complete.  She can bring it back when she comes.

## 2021-09-06 ENCOUNTER — Other Ambulatory Visit: Payer: Self-pay | Admitting: Family

## 2021-09-06 ENCOUNTER — Encounter: Payer: Self-pay | Admitting: Allergy

## 2021-09-06 DIAGNOSIS — J329 Chronic sinusitis, unspecified: Secondary | ICD-10-CM | POA: Diagnosis not present

## 2021-09-06 DIAGNOSIS — K5900 Constipation, unspecified: Secondary | ICD-10-CM | POA: Diagnosis not present

## 2021-09-06 DIAGNOSIS — Z20822 Contact with and (suspected) exposure to covid-19: Secondary | ICD-10-CM | POA: Diagnosis not present

## 2021-09-06 DIAGNOSIS — Z87891 Personal history of nicotine dependence: Secondary | ICD-10-CM | POA: Diagnosis not present

## 2021-09-07 ENCOUNTER — Telehealth: Payer: Self-pay | Admitting: Family

## 2021-09-07 NOTE — Telephone Encounter (Signed)
Called the [facility] pharmacy and called the pt to explain it was a missed communication. No answer, responded to pt MyChart msg that was sent already with details.

## 2021-09-07 NOTE — Telephone Encounter (Signed)
Pt states she needs refill for xopenex inhaler, she says [facility] lost her prescription and she wants new prescription sent to [facility] in [place].

## 2021-09-08 ENCOUNTER — Telehealth: Payer: Self-pay | Admitting: Internal Medicine

## 2021-09-08 ENCOUNTER — Telehealth: Payer: Self-pay

## 2021-09-08 ENCOUNTER — Encounter: Payer: Self-pay | Admitting: Family Medicine

## 2021-09-08 ENCOUNTER — Other Ambulatory Visit: Payer: Self-pay

## 2021-09-08 DIAGNOSIS — Z20822 Contact with and (suspected) exposure to covid-19: Secondary | ICD-10-CM | POA: Diagnosis not present

## 2021-09-08 DIAGNOSIS — J019 Acute sinusitis, unspecified: Secondary | ICD-10-CM | POA: Diagnosis not present

## 2021-09-08 DIAGNOSIS — Z87891 Personal history of nicotine dependence: Secondary | ICD-10-CM | POA: Diagnosis not present

## 2021-09-08 DIAGNOSIS — R0789 Other chest pain: Secondary | ICD-10-CM | POA: Diagnosis not present

## 2021-09-08 DIAGNOSIS — J069 Acute upper respiratory infection, unspecified: Secondary | ICD-10-CM | POA: Diagnosis not present

## 2021-09-08 DIAGNOSIS — R059 Cough, unspecified: Secondary | ICD-10-CM | POA: Diagnosis not present

## 2021-09-08 DIAGNOSIS — R0602 Shortness of breath: Secondary | ICD-10-CM | POA: Diagnosis not present

## 2021-09-08 DIAGNOSIS — F411 Generalized anxiety disorder: Secondary | ICD-10-CM

## 2021-09-08 DIAGNOSIS — I10 Essential (primary) hypertension: Secondary | ICD-10-CM | POA: Diagnosis not present

## 2021-09-08 DIAGNOSIS — R079 Chest pain, unspecified: Secondary | ICD-10-CM | POA: Diagnosis not present

## 2021-09-08 MED ORDER — METOPROLOL TARTRATE 25 MG PO TABS: ORAL_TABLET | ORAL | 1 refills | Status: DC

## 2021-09-08 NOTE — Telephone Encounter (Signed)
I spoke to patient after lunch which she gave me a [employer] phone number to call [phone] spoke to [place]. P. Told her this was urgent to get xopenex PA approved quickly. The rush was put through and [facility] responded quickly with an approval. [facility] is a ware and so is the patient. Patient stated her chest still felt tight so I told patient if chest feels tight after using the xopenex with no relief to go to the emergency room. Patients xopenex is approved through 08/13/2022.

## 2021-09-08 NOTE — Telephone Encounter (Signed)
Jennifer [name] called and states she is wanting the potassium packets instead of the liquid.

## 2021-09-08 NOTE — Telephone Encounter (Signed)
Called pt and explained per the nurses that she could call [facility] and ask [facility] to transfer from [facility]. Pt begin to yell and stated she was not calling [facility] and that she needed a PA for xopenex. Pt hung up.

## 2021-09-08 NOTE — Telephone Encounter (Signed)
I am not going to send over powder.  It is much more difficult to dose and it has to be mixed.  She can stick with a liquid and if the insurance will cover the liquid I am happy to switch her to the smaller tabs.

## 2021-09-08 NOTE — Telephone Encounter (Signed)
Patient states called on 09-07-2021 in reference to xopenex inhaler that was suppose to be a [facility]  this was not found,  [facility] did reach out to [facility] to fill this RX but Patient requested this RX be moved to [facility] [address] in [place] please advise

## 2021-09-08 NOTE — Telephone Encounter (Signed)
Medication: metaxalone (SKELAXIN) 400 MG tablet Prior authorization submitted via CoverMyMeds on 09/08/2021 PA submission pending

## 2021-09-08 NOTE — Telephone Encounter (Signed)
Left message for patient that her xopenex hfa was called [facility]. I spoke with ivy and she said they will text you when it is ready.

## 2021-09-09 DIAGNOSIS — R0602 Shortness of breath: Secondary | ICD-10-CM | POA: Diagnosis not present

## 2021-09-09 MED ORDER — BUSPIRONE HCL 5 MG PO TABS: 5.0000 mg | ORAL_TABLET | Freq: Two times a day (BID) | ORAL | 0 refills | Status: DC

## 2021-09-09 MED ORDER — POTASSIUM CHLORIDE 20 MEQ PO PACK: 20.0000 meq | PACK | Freq: Every day | ORAL | 0 refills | Status: DC

## 2021-09-09 NOTE — Telephone Encounter (Signed)
OK meds sent including potassium packets. Pls disregard other message.    Meds ordered this encounter  Medications   metoprolol tartrate (LOPRESSOR) 25 MG tablet    Sig: Take 1 tablet (25 mg total) by mouth in the morning, at noon, and at bedtime.    Dispense:  90 tablet    Refill:  1   busPIRone (BUSPAR) 5 MG tablet    Sig: Take 1 tablet (5 mg total) by mouth 2 (two) times daily.    Dispense:  60 tablet    Refill:  0   potassium chloride (KLOR-CON) 20 MEQ packet    Sig: Take 20 mEq by mouth daily.    Dispense:  30 each    Refill:  0

## 2021-09-09 NOTE — Telephone Encounter (Signed)
See MyChart message

## 2021-09-12 DIAGNOSIS — I10 Essential (primary) hypertension: Secondary | ICD-10-CM | POA: Diagnosis not present

## 2021-09-12 DIAGNOSIS — S2231XA Fracture of one rib, right side, initial encounter for closed fracture: Secondary | ICD-10-CM | POA: Diagnosis not present

## 2021-09-12 DIAGNOSIS — R0602 Shortness of breath: Secondary | ICD-10-CM | POA: Diagnosis not present

## 2021-09-12 DIAGNOSIS — Z87891 Personal history of nicotine dependence: Secondary | ICD-10-CM | POA: Diagnosis not present

## 2021-09-12 NOTE — Telephone Encounter (Signed)
Medication: metaxalone (SKELAXIN) 400 MG tablet Prior authorization determination received Medication has been denied Reason for denial:  "The drug you asked for is not listed in your preferred drug list (formulary). The preferred drug(s), you may not have tried are: tizanidine tablet, baclofen tablet AND carisoprodol 350 mg tablet"

## 2021-09-13 DIAGNOSIS — R9431 Abnormal electrocardiogram [ECG] [EKG]: Secondary | ICD-10-CM | POA: Diagnosis not present

## 2021-09-14 ENCOUNTER — Other Ambulatory Visit: Payer: Self-pay

## 2021-09-14 ENCOUNTER — Encounter: Payer: Self-pay | Admitting: Allergy

## 2021-09-14 ENCOUNTER — Ambulatory Visit: Payer: Medicare HMO | Admitting: Allergy

## 2021-09-14 VITALS — BP 174/80 | HR 96 | Resp 20

## 2021-09-14 DIAGNOSIS — J4541 Moderate persistent asthma with (acute) exacerbation: Secondary | ICD-10-CM

## 2021-09-14 NOTE — Progress Notes (Signed)
Follow-up Note  RE: Jennifer [name] MRN: [id number] DOB: August 31, 1965 Date of Office Visit: 09/14/2021   History of present illness: Jennifer [name] is a 56 y.o. female presenting today for sick visit.  She has history of asthma.  She was last seen in the office on 07/20/2021 by myself. She has had several ED visits since her last visit for respiratory and sinus related issues on 09/08/2021 and 09/12/2021.  At this visit she has had negative COVID testing as well as flu.  She reports having a chest x-ray done at the most recent visit however I do not appear to have access to the images or report.  She did have a normal chest x-ray from 08/11/2021. She reports that 1 of these ED visit she was prescribed doxycycline but states it was going to take too long to get it from the pharmacy that she never got this antibiotic.  She states currently she has been having symptoms coughing, wheezing, shortness of breath, fatigue, chest tightness, dry cough.  However since then she states she is producing a thick mucus.  She states he has been a triggering of her symptoms.  Strong fumes has also historically been a trigger of her symptoms. She states her pulse ox at home has gotten down to 92% since she has hasn't been feeling well.   She has been performing nasal saline rinses 1-2 times a day and she does find this to be helpful.  She has been using her Xopenex inhaler that she also states has been effective but there has been a time where she did need to use the Xopenex solution via nebulizer and states this made her jittery.  The Xopenex via inhaler form does not make her jittery.  She also has been doing her flutter valve device several times a day.  She also states she stopped taking guaifenesin as she was taking it almost daily even when she was feeling good and she was not sure if it was helping or hurting her that she decided to stop.   Review of systems: Review of Systems  Constitutional:  Positive for  fatigue.  HENT:  Positive for congestion and sinus pressure.   Eyes: Negative.   Respiratory:  Positive for cough, chest tightness, shortness of breath and wheezing.   Cardiovascular: Negative.   Gastrointestinal: Negative.   Musculoskeletal: Negative.   Skin: Negative.   Allergic/Immunologic: Negative.   Neurological: Negative.     All other systems negative unless noted above in HPI  Past medical/social/surgical/family history have been reviewed and are unchanged unless specifically indicated below.  No changes  Medication List: Current Outpatient Medications  Medication Sig Dispense Refill   ACCU-CHEK GUIDE test strip For testing blood sugars dailyDx:r73.03 100 each 11   Accu-Chek Softclix Lancets lancets Dx:r73.03 100 each 11   acetaminophen (TYLENOL) 160 MG chewable tablet Chew by mouth.     AMBULATORY NON FORMULARY MEDICATION Medication Name: Wheelchair.  Dx. Multiple sclerosis, lower extremity weakness. 1 Units 0   blood glucose meter kit and supplies Dispense based on patient and insurance preference. Use up to four times daily as directed. R73.03 1 each 0   EPINEPHrine 0.3 mg/0.3 mL IJ SOAJ injection SMARTSIG:0.3 Milligram(s) IM Once     famotidine (PEPCID) 20 MG tablet Take by mouth.     flunisolide (NASALIDE) 25 MCG/ACT (0.025%) SOLN Place into the nose.     levalbuterol (XOPENEX HFA) 45 MCG/ACT inhaler INHALE 2 PUFFS INTO THE LUNGS EVERY 6  HOURS AS NEEDED FOR WHEEZING 45 g 0   levalbuterol (XOPENEX) 1.25 MG/3ML nebulizer solution Take 1.25 mg by nebulization every 3 (three) hours as needed for wheezing. 72 mL 1   metoprolol tartrate (LOPRESSOR) 25 MG tablet Take 1 tablet (25 mg total) by mouth in the morning, at noon, and at bedtime. 90 tablet 1   ondansetron (ZOFRAN-ODT) 4 MG disintegrating tablet Take by mouth.     potassium chloride (KLOR-CON) 20 MEQ packet Take 20 mEq by mouth daily. 30 each 0   guaiFENesin (ROBITUSSIN) 100 MG/5ML liquid Take by mouth. (Patient not  taking: Reported on 09/14/2021)     No current facility-administered medications for this visit.     Known medication allergies: Allergies  Allergen Reactions   Azithromycin Shortness Of Breath    Lip swelling, SOB.      Ciprofloxacin Swelling    REACTION: tongue swells   Codeine Shortness Of Breath   Erythromycin Base Itching and Rash   Sulfa Antibiotics Shortness Of Breath, Rash and Other (See Comments)   Sulfasalazine Rash and Shortness Of Breath    Other reaction(s): Other (See Comments) Other reaction(s): SHORTNESS OF BREATH   Telmisartan Swelling    Tongue swelling, Micardis   Ace Inhibitors Cough   Aspirin Hives and Other (See Comments)    flushing   Atenolol Other (See Comments)    Squeezing chest sensation   Avelox [Moxifloxacin Hcl In Nacl] Itching        Beta Adrenergic Blockers Other (See Comments)    Feels like chest tightening labetalol, bystolic  Feels like chest tightening "Metoprolol"    Buspar [Buspirone] Other (See Comments)    Light headed   Butorphanol Tartrate Other (See Comments)    Patient aggitated   Cetirizine Hives and Rash        Clonidine Hcl     REACTION: makes blood pressure high   Cortisone     Feels like she is going crazy   Erythromycin Rash   Fentanyl Other (See Comments)    aggressive    Fluoxetine Hcl Other (See Comments)    REACTION: headaches   Ketorolac Tromethamine     jittery   Lidocaine Other (See Comments)    When it involves the throat,    Lisinopril Cough   Metoclopramide Hcl Other (See Comments)    Dystonic reaction   Midazolam Other (See Comments)    agitation Slow to wake up   Montelukast Other (See Comments)    Singulair   Montelukast Sodium Other (See Comments)    DOES NOT REMEMBER  Don't remember-told not to take   Naproxen Other (See Comments)    FLUSHING Pt states she took Ibuprofen today (10/08/19)   Paroxetine Other (See Comments)    REACTION: headaches   Penicillins Rash   Pravastatin Other (See  Comments)    Myalgias   Promethazine Other (See Comments)    Dystonic reaction   Promethazine Hcl Other (See Comments)    jittery   Quinolones Swelling and Rash   Serotonin Reuptake Inhibitors (Ssris) Other (See Comments)    Headache Effexor, prozac, zoloft,    Sertraline Hcl     REACTION: headaches   Stelazine [Trifluoperazine] Other (See Comments)    Dystonic reaction   Tobramycin Itching and Rash   Trifluoperazine Hcl     dystonic   Atrovent Nasal Spray [Ipratropium]     Tachycardia and shaking   Diltiazem Other (See Comments)    Chest pain   Polyethylene Glycol 3350  Other reaction(s): Laryngeal Edema (ALLERGY)   Propoxyphene    Adhesive [Tape] Rash    EKG monitor patches, some tapes Blisters, rash, itching, welts.   Butorphanol Anxiety    Patient agitated   Ceftriaxone Rash    rocephin   Iron Rash    Flushing with certain IV types   Metoclopramide Itching and Other (See Comments)    Dystonic reaction   Metronidazole Rash   Other Rash and Other (See Comments)    Uncoded Allergy. Allergen: steriods, Other Reaction: Not Assessed Other reaction(s): Flushing (ALLERGY/intolerance), GI Upset (intolerance), Hypertension (intolerance), Increased Heart Rate (intolerance), Mental Status Changes (intolerance), Other (See Comments), Tachycardia / Palpitations  (intolerance) Hospital gowns leave a rash.    Prednisone Anxiety and Palpitations   Prochlorperazine Anxiety    Compazine:  Dystonic reaction   Venlafaxine Anxiety   Zyrtec [Cetirizine Hcl] Rash    All over body     Physical examination: Blood pressure (!) 174/80, pulse 96, resp. rate 20, last menstrual period 06/25/2013, SpO2 97 %.  General: Alert, interactive, in no acute distress. HEENT: PERRLA, TMs pearly gray, turbinates non-edematous without discharge, post-pharynx non erythematous. Neck: Supple without lymphadenopathy. Lungs: Clear to auscultation without wheezing, rhonchi or rales. {no increased work  of breathing. CV: Normal S1, S2 without murmurs. Abdomen: Nondistended, nontender. Skin: Warm and dry, without lesions or rashes. Extremities:  No clubbing, cyanosis or edema. Neuro:   Grossly intact.  Diagnositics/Labs: Labs/Imaging:  CXR 08/11/21 is unremarkable, no active cardiopulmonary disease  Spirometry: FEV1: 2.04L 84%, FVC: 2.3L 75%, ratio consistent with nonobstructive pattern  Assessment and plan: Moderate persistent asthma with flare - your lung testing today looks ok today - Continue Xopenex hfa 2 puffs every 6 hours as needed for cough, wheeze,tightness in chest, or shortness of breath OR Xopenex 1.25 mg 1 unit dose via nebulizer every 4-6 hours as needed - Continue to perform nasal saline rinses 1-3 times a day as needed to help clean sinus tract - Can use guaifenesin twice a day with large glass of water to help thin and mobilize mucus - Continue to follow up with [facility].  - Continue to use aerobika valve 1-2 times a day for 5-10 minutes  - I am quite limited in the medications we can use due to her extensive medication allergy list.  I have discussed several medication options in the past with her however she is very hesitant to start any medicine due to possible adverse effect  Schedule a follow up appointment in 6 months. I appreciate the opportunity to take part in Jennifer [name]'s care. Please do not hesitate to contact me with questions.  Sincerely,   Prudy Feeler, MD Allergy/Immunology [facility] [place] [facility] [place]

## 2021-09-14 NOTE — Patient Instructions (Addendum)
Moderate persistent asthma with flare - your lung testing today looks ok today - Continue Xopenex hfa 2 puffs every 6 hours as needed for cough, wheeze,tightness in chest, or shortness of breath OR Xopenex 1.25 mg 1 unit dose via nebulizer every 4-6 hours as needed - Continue to perform nasal saline rinses 1-3 times a day as needed to help clean sinus tract - Can use guaifenesin twice a day with large glass of water to help thin and mobilize mucus - Continue to follow up with [facility].  - Continue to use aerobika valve 1-2 times a day for 5-10 minutes    Schedule a follow up appointment in 6 months.

## 2021-09-16 ENCOUNTER — Encounter: Payer: Self-pay | Admitting: Family Medicine

## 2021-09-16 ENCOUNTER — Ambulatory Visit (INDEPENDENT_AMBULATORY_CARE_PROVIDER_SITE_OTHER): Payer: Medicare HMO

## 2021-09-16 ENCOUNTER — Ambulatory Visit (INDEPENDENT_AMBULATORY_CARE_PROVIDER_SITE_OTHER): Payer: Medicare HMO | Admitting: Family Medicine

## 2021-09-16 ENCOUNTER — Other Ambulatory Visit: Payer: Self-pay

## 2021-09-16 VITALS — BP 157/82 | HR 90 | Resp 18 | Ht 62.0 in | Wt 230.0 lb

## 2021-09-16 DIAGNOSIS — J4540 Moderate persistent asthma, uncomplicated: Secondary | ICD-10-CM

## 2021-09-16 DIAGNOSIS — E538 Deficiency of other specified B group vitamins: Secondary | ICD-10-CM

## 2021-09-16 DIAGNOSIS — R109 Unspecified abdominal pain: Secondary | ICD-10-CM | POA: Diagnosis not present

## 2021-09-16 DIAGNOSIS — R1030 Lower abdominal pain, unspecified: Secondary | ICD-10-CM

## 2021-09-16 DIAGNOSIS — K5900 Constipation, unspecified: Secondary | ICD-10-CM

## 2021-09-16 DIAGNOSIS — I10 Essential (primary) hypertension: Secondary | ICD-10-CM

## 2021-09-16 NOTE — Patient Instructions (Addendum)
Still has a moderate stool burden on the x-ray. Commend using the magnesium citrate daily for 2 to 3 days. Then okay to use the Dulcolax, 2 tabs at night twice a week to try to keep stools moving more regularly.  Please restart your famotidine daily.  Try to take about 20 minutes before your first meal of the day for best absorption.  I did not see losartan on your intolerance list.  It may be that we did not add it at some point but if you are open to trying a really small dose we certainly can you can just think on it this weekend and then let me know.

## 2021-09-16 NOTE — Assessment & Plan Note (Signed)
Still uncontrolled today.  We did discuss that she did do okay with lisinopril in the past but it caused a cough.  I did not see lower losartan on her intolerance list so we could certainly consider a trial.
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
## 2021-09-16 NOTE — Assessment & Plan Note (Signed)
She feels like she is having a little bit more chest symptoms currently but did just see the asthma and allergy doctor.  They said that her repeat testing actually looked pretty good.

## 2021-09-19 DIAGNOSIS — R0600 Dyspnea, unspecified: Secondary | ICD-10-CM | POA: Diagnosis not present

## 2021-09-19 DIAGNOSIS — R079 Chest pain, unspecified: Secondary | ICD-10-CM | POA: Diagnosis not present

## 2021-09-19 DIAGNOSIS — Z95818 Presence of other cardiac implants and grafts: Secondary | ICD-10-CM | POA: Diagnosis not present

## 2021-09-19 DIAGNOSIS — R0789 Other chest pain: Secondary | ICD-10-CM | POA: Diagnosis not present

## 2021-09-19 DIAGNOSIS — R000 Tachycardia, unspecified: Secondary | ICD-10-CM | POA: Diagnosis not present

## 2021-09-19 NOTE — Progress Notes (Signed)
Hi Jennifer [name], they just said there was a small amount of stool burden on that right side.  So that means that you are emptying a little better than we thought.  Hopefully you are able to move just a little bit more stool over the weekend.

## 2021-09-20 DIAGNOSIS — R000 Tachycardia, unspecified: Secondary | ICD-10-CM | POA: Diagnosis not present

## 2021-09-20 DIAGNOSIS — R0602 Shortness of breath: Secondary | ICD-10-CM | POA: Diagnosis not present

## 2021-09-22 DIAGNOSIS — I10 Essential (primary) hypertension: Secondary | ICD-10-CM | POA: Diagnosis not present

## 2021-09-22 DIAGNOSIS — E538 Deficiency of other specified B group vitamins: Secondary | ICD-10-CM | POA: Diagnosis not present

## 2021-09-23 DIAGNOSIS — R062 Wheezing: Secondary | ICD-10-CM | POA: Diagnosis not present

## 2021-09-23 DIAGNOSIS — Z87891 Personal history of nicotine dependence: Secondary | ICD-10-CM | POA: Diagnosis not present

## 2021-09-23 DIAGNOSIS — I10 Essential (primary) hypertension: Secondary | ICD-10-CM | POA: Diagnosis not present

## 2021-09-23 DIAGNOSIS — R0602 Shortness of breath: Secondary | ICD-10-CM | POA: Diagnosis not present

## 2021-09-23 DIAGNOSIS — R000 Tachycardia, unspecified: Secondary | ICD-10-CM | POA: Diagnosis not present

## 2021-09-23 DIAGNOSIS — R059 Cough, unspecified: Secondary | ICD-10-CM | POA: Diagnosis not present

## 2021-09-23 DIAGNOSIS — T59811A Toxic effect of smoke, accidental (unintentional), initial encounter: Secondary | ICD-10-CM | POA: Diagnosis not present

## 2021-09-23 DIAGNOSIS — R079 Chest pain, unspecified: Secondary | ICD-10-CM | POA: Diagnosis not present

## 2021-09-23 DIAGNOSIS — Y999 Unspecified external cause status: Secondary | ICD-10-CM | POA: Diagnosis not present

## 2021-09-23 DIAGNOSIS — X58XXXA Exposure to other specified factors, initial encounter: Secondary | ICD-10-CM | POA: Diagnosis not present

## 2021-09-26 DIAGNOSIS — R059 Cough, unspecified: Secondary | ICD-10-CM | POA: Diagnosis not present

## 2021-09-26 DIAGNOSIS — R0602 Shortness of breath: Secondary | ICD-10-CM | POA: Diagnosis not present

## 2021-09-30 ENCOUNTER — Ambulatory Visit (INDEPENDENT_AMBULATORY_CARE_PROVIDER_SITE_OTHER): Payer: Medicare HMO | Admitting: Family Medicine

## 2021-09-30 ENCOUNTER — Other Ambulatory Visit: Payer: Self-pay

## 2021-09-30 ENCOUNTER — Encounter: Payer: Self-pay | Admitting: Family Medicine

## 2021-09-30 VITALS — BP 150/74 | HR 90 | Resp 18 | Ht 62.0 in | Wt 229.0 lb

## 2021-09-30 DIAGNOSIS — Z6841 Body Mass Index (BMI) 40.0 and over, adult: Secondary | ICD-10-CM

## 2021-09-30 DIAGNOSIS — I10 Essential (primary) hypertension: Secondary | ICD-10-CM | POA: Diagnosis not present

## 2021-09-30 DIAGNOSIS — T59811A Toxic effect of smoke, accidental (unintentional), initial encounter: Secondary | ICD-10-CM

## 2021-09-30 DIAGNOSIS — M797 Fibromyalgia: Secondary | ICD-10-CM | POA: Diagnosis not present

## 2021-09-30 DIAGNOSIS — D510 Vitamin B12 deficiency anemia due to intrinsic factor deficiency: Secondary | ICD-10-CM

## 2021-09-30 DIAGNOSIS — Z7689 Persons encountering health services in other specified circumstances: Secondary | ICD-10-CM

## 2021-09-30 MED ORDER — DOXYCYCLINE HYCLATE 50 MG PO CAPS: 50.0000 mg | ORAL_CAPSULE | Freq: Two times a day (BID) | ORAL | 0 refills | Status: DC

## 2021-09-30 MED ORDER — METOPROLOL TARTRATE 50 MG PO TABS: 50.0000 mg | ORAL_TABLET | Freq: Two times a day (BID) | ORAL | 0 refills | Status: DC

## 2021-09-30 NOTE — Assessment & Plan Note (Signed)
12 levels look great.  Continue with monthly injections for now and plan to recheck again in 6 months.

## 2021-09-30 NOTE — Assessment & Plan Note (Signed)
We did discuss Cymbalta as a possibility.  I think it could help some with her mood as well as help with her for right fibromyalgia.  We discussed that it is FDA approved for that indication we also discussed very briefly gabapentin.  Encouraged her to think about it I think it could be helpful it sounds like she may have had some tardive dyskinesia symptoms with some previous medications in the past.  Certainly that would be something that we would need to monitor for but is definitely not common.

## 2021-09-30 NOTE — Assessment & Plan Note (Signed)
Blood pressure still not well controlled today but she only took a half a tab of her medication.  We discussed that she could certainly talk with her pharmacist about getting the other alternate generic and seeing if it is available.  Numbness and tingling is not typical side effect of beta-blockers but certainly that is with the change it could be a potential cause

## 2021-09-30 NOTE — Assessment & Plan Note (Signed)
See previous note.  We will place new referral to healthy weight and wellness with [place].  I think the wires got a little crossed and they were calling her from the bariatric surgical program at [facility].
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
## 2021-10-05 DIAGNOSIS — M79601 Pain in right arm: Secondary | ICD-10-CM | POA: Diagnosis not present

## 2021-10-05 DIAGNOSIS — M7989 Other specified soft tissue disorders: Secondary | ICD-10-CM | POA: Diagnosis not present

## 2021-10-05 DIAGNOSIS — M25511 Pain in right shoulder: Secondary | ICD-10-CM | POA: Diagnosis not present

## 2021-10-12 DIAGNOSIS — I6621 Occlusion and stenosis of right posterior cerebral artery: Secondary | ICD-10-CM | POA: Diagnosis not present

## 2021-10-12 DIAGNOSIS — R42 Dizziness and giddiness: Secondary | ICD-10-CM | POA: Diagnosis not present

## 2021-10-12 DIAGNOSIS — E611 Iron deficiency: Secondary | ICD-10-CM | POA: Diagnosis not present

## 2021-10-12 DIAGNOSIS — R519 Headache, unspecified: Secondary | ICD-10-CM | POA: Diagnosis not present

## 2021-10-12 DIAGNOSIS — I10 Essential (primary) hypertension: Secondary | ICD-10-CM | POA: Diagnosis not present

## 2021-10-13 DIAGNOSIS — I700 Atherosclerosis of aorta: Secondary | ICD-10-CM | POA: Diagnosis not present

## 2021-10-13 DIAGNOSIS — E782 Mixed hyperlipidemia: Secondary | ICD-10-CM | POA: Diagnosis not present

## 2021-10-13 DIAGNOSIS — J4520 Mild intermittent asthma, uncomplicated: Secondary | ICD-10-CM | POA: Diagnosis not present

## 2021-10-13 DIAGNOSIS — I471 Supraventricular tachycardia: Secondary | ICD-10-CM | POA: Diagnosis not present

## 2021-10-13 DIAGNOSIS — G4733 Obstructive sleep apnea (adult) (pediatric): Secondary | ICD-10-CM | POA: Diagnosis not present

## 2021-10-13 DIAGNOSIS — I10 Essential (primary) hypertension: Secondary | ICD-10-CM | POA: Diagnosis not present

## 2021-10-14 ENCOUNTER — Ambulatory Visit (INDEPENDENT_AMBULATORY_CARE_PROVIDER_SITE_OTHER): Payer: Medicare HMO | Admitting: Family Medicine

## 2021-10-14 ENCOUNTER — Encounter: Payer: Self-pay | Admitting: Family Medicine

## 2021-10-14 ENCOUNTER — Other Ambulatory Visit: Payer: Self-pay

## 2021-10-14 VITALS — BP 158/80 | HR 84 | Ht 62.0 in | Wt 232.0 lb

## 2021-10-14 DIAGNOSIS — M25511 Pain in right shoulder: Secondary | ICD-10-CM | POA: Diagnosis not present

## 2021-10-14 DIAGNOSIS — I10 Essential (primary) hypertension: Secondary | ICD-10-CM | POA: Diagnosis not present

## 2021-10-14 DIAGNOSIS — E785 Hyperlipidemia, unspecified: Secondary | ICD-10-CM | POA: Diagnosis not present

## 2021-10-14 MED ORDER — AMLODIPINE BESYLATE 2.5 MG PO TABS: 2.5000 mg | ORAL_TABLET | Freq: Every day | ORAL | 0 refills | Status: DC

## 2021-10-14 NOTE — Assessment & Plan Note (Signed)
Blood pressure is not optimally controlled.  She is willing to consider a trial of amlodipine 2.5 mg she can start with a half a tab to make sure that she feels comfortable with it and continue with the metoprolol as well. ?

## 2021-10-14 NOTE — Assessment & Plan Note (Signed)
It looks like she is never tried Zetia so I definitely think it is a consideration though we did discuss that they are not nearly as efficacious and have the reduction in morbidity and mortality seen with statins but it would certainly be a  reasonable option if she is intolerant. ?
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
## 2021-10-15 DIAGNOSIS — R000 Tachycardia, unspecified: Secondary | ICD-10-CM | POA: Diagnosis not present

## 2021-10-15 IMAGING — US US ABDOMEN COMPLETE
1 series · 13 of 25 positions shown · non-contrast
Comparison: CT 05/07/2020.

CLINICAL DATA: Right upper quadrant pain beginning yesterday.
Patient is not NPO.

EXAM:
ABDOMEN ULTRASOUND COMPLETE

[Series 1: us abdomen complete · 0.22mm/px · 13 of 78 slices shown]
[im 1/78]
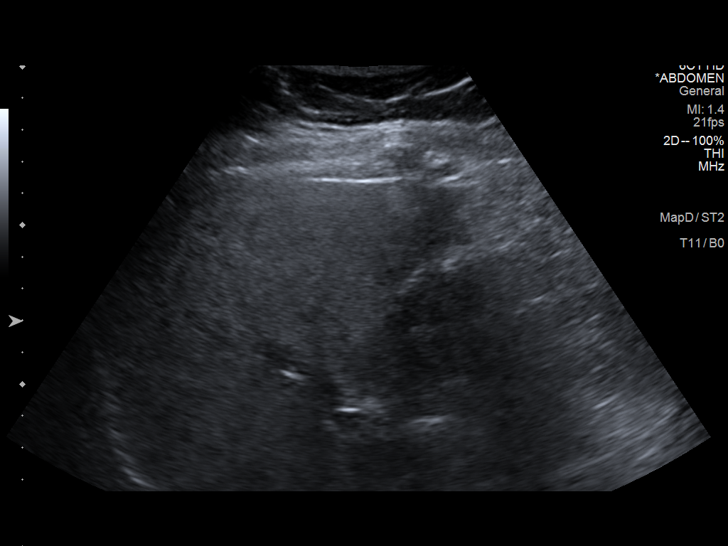
[im 7/78]
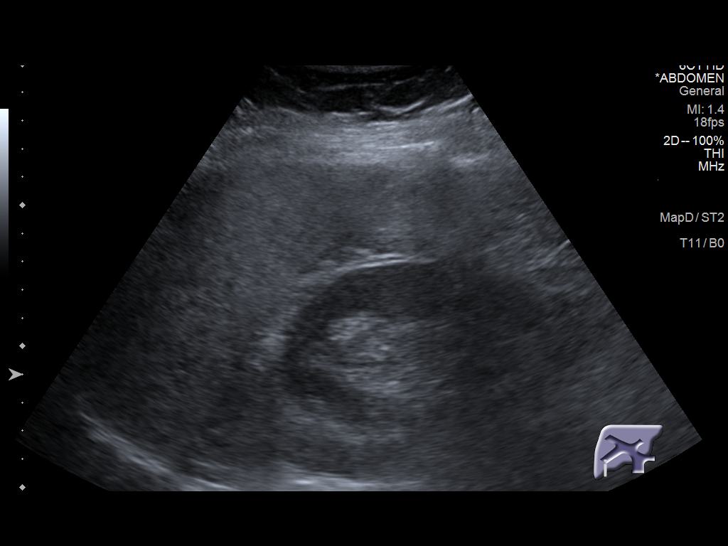
[im 13/78]
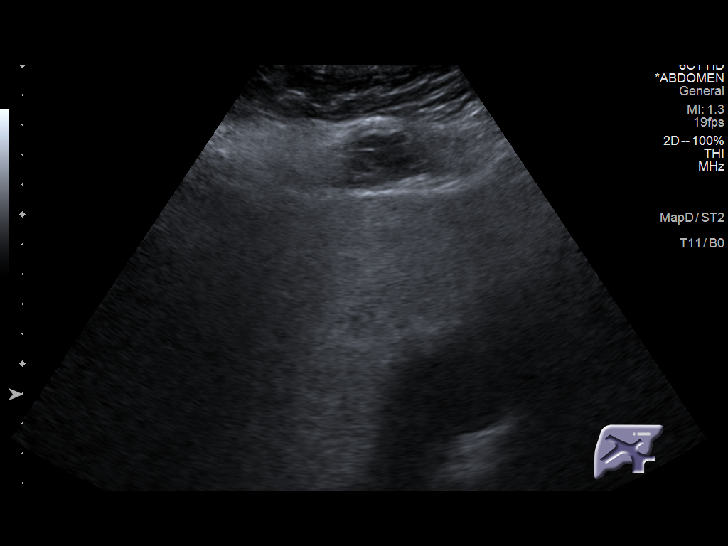
[im 20/78]
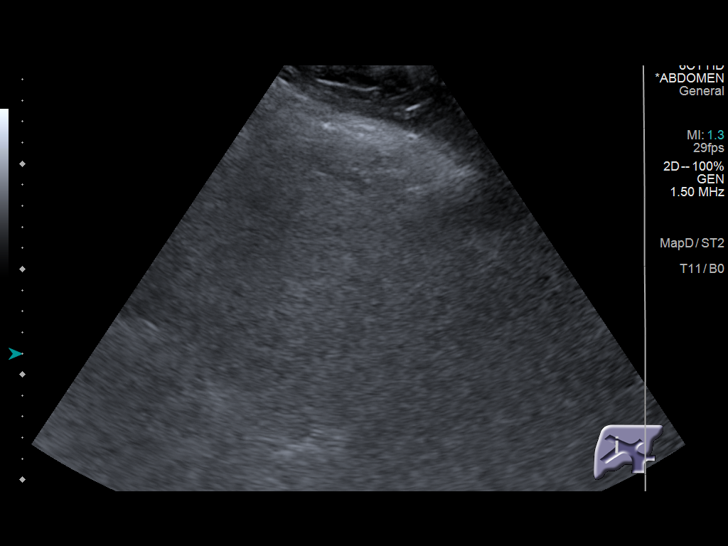
[im 26/78]
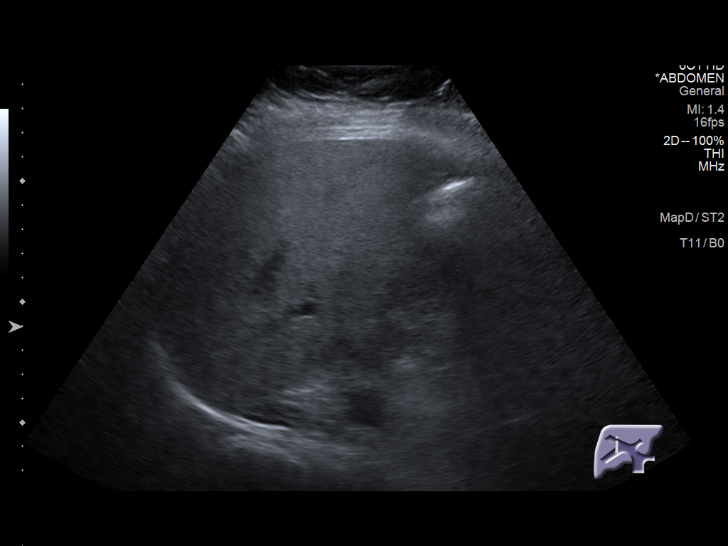
[im 33/78]
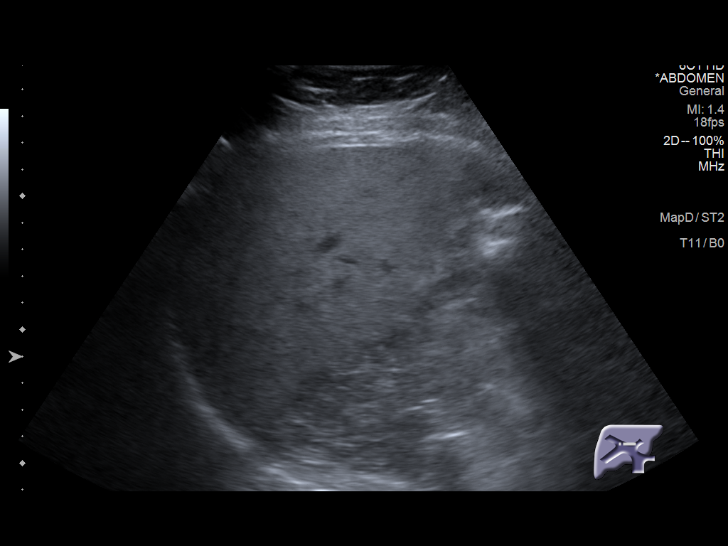
[im 39/78]
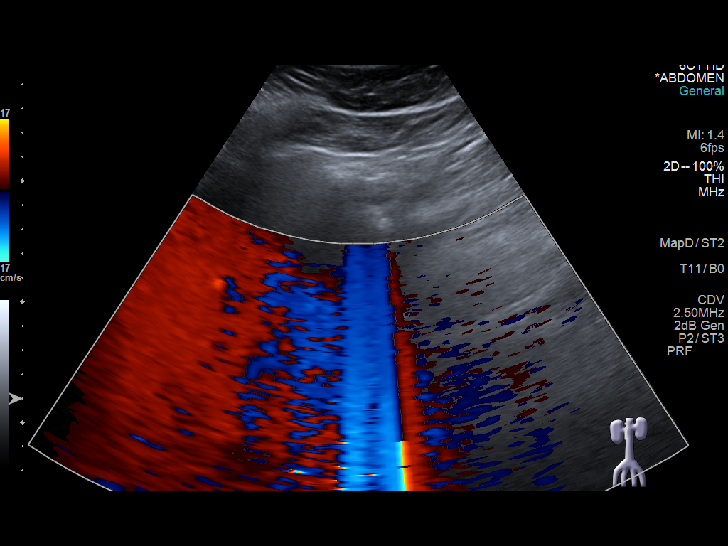
[im 45/78]
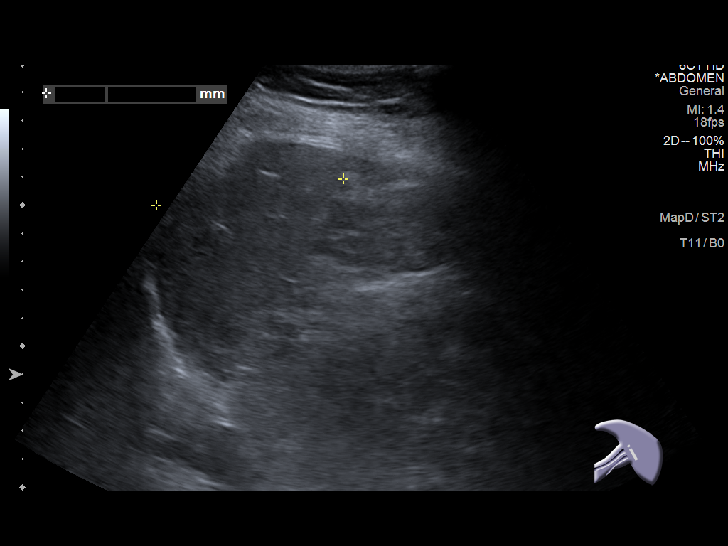
[im 52/78]
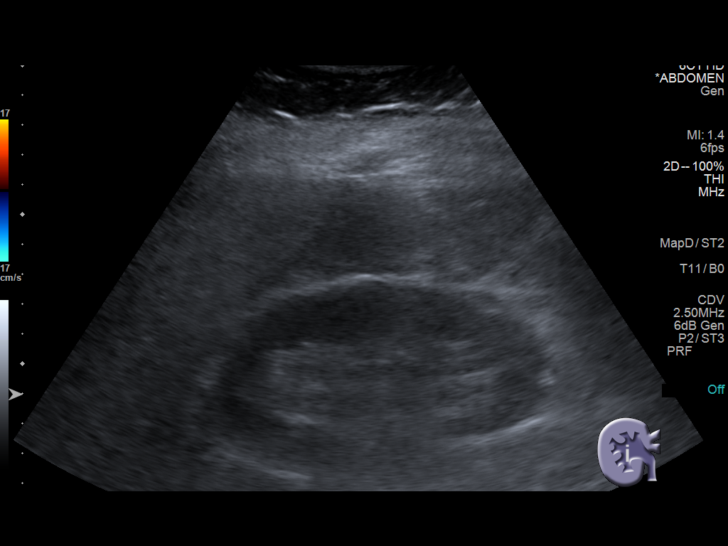
[im 58/78]
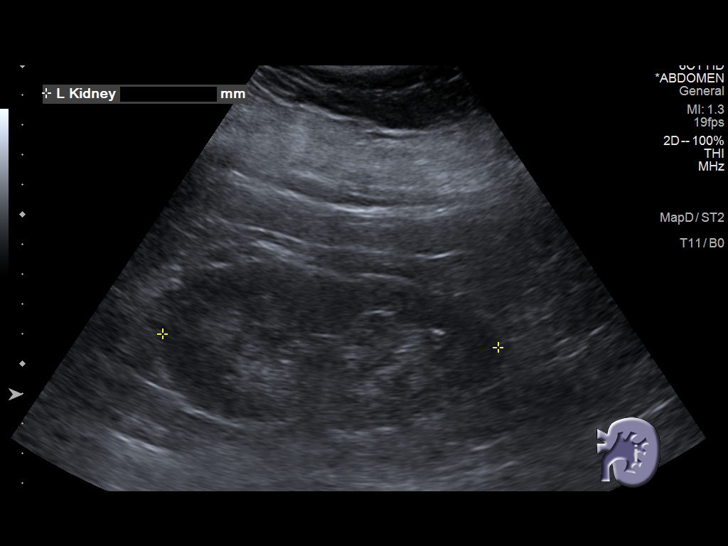
[im 65/78]
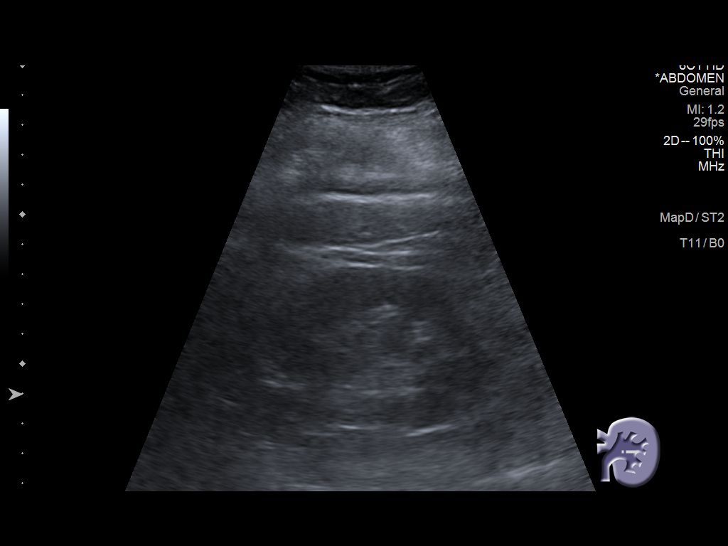
[im 71/78]
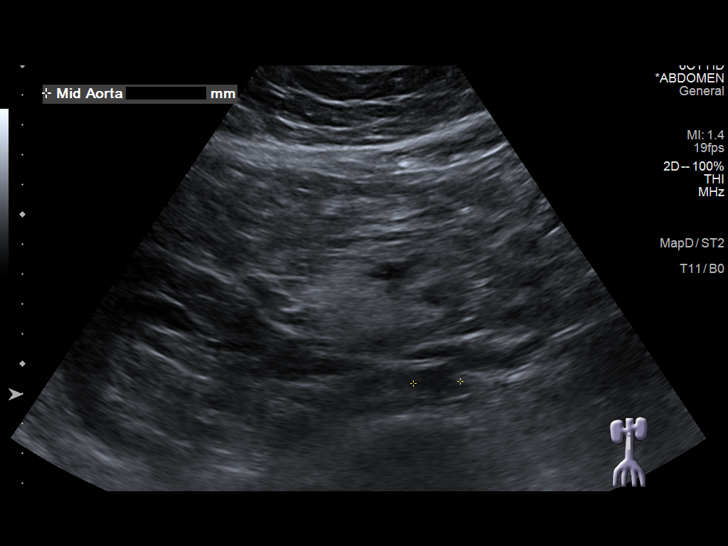
[im 78/78]
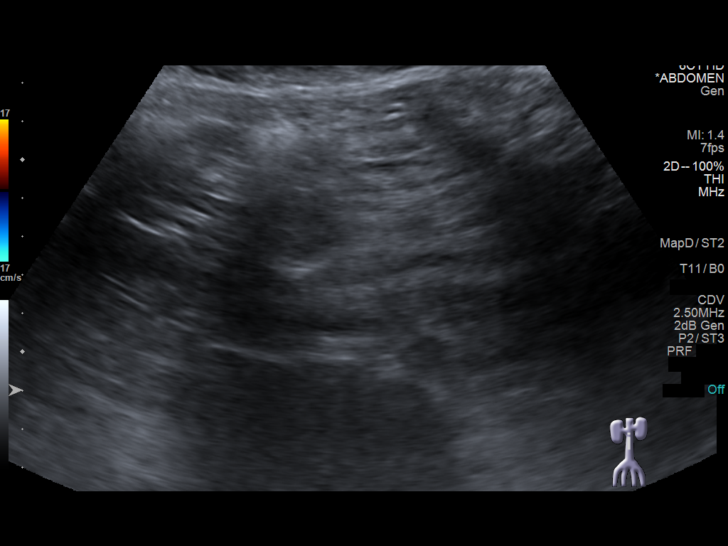

[13 of 25 positions shown; findings below may reference images not displayed]

FINDINGS: Gallbladder: Surgically absent.

Common bile duct: Diameter: Not visible. Certainly the common duct
is not enlarged.

Liver: Diffusely increased echogenicity suggesting steatosis. No
focal lesion. No sign of intrahepatic ductal dilatation. Portal vein
is patent on color Doppler imaging with normal direction of blood
flow towards the liver.

IVC: Not visible because of overlying bowel gas.

Pancreas: Partially obscured by overlying bowel gas. Visualized
portions appear normal.

Spleen: Size and appearance within normal limits.

Right Kidney: Length: 11.5 cm. Echogenicity within normal limits. No
mass or hydronephrosis visualized.

Left Kidney: Length: 11.2 cm. Echogenicity within normal limits. No
mass or hydronephrosis visualized.

Abdominal aorta: Poorly seen because of overlying bowel gas.

Other findings: No sign of ascites.
IMPRESSION: Previous cholecystectomy. Diffusely echogenic liver suggesting
steatosis. No sign of focal lesion or ductal dilatation.

Other limitations as above due to overlying bowel gas and bowel
contents. Patient not NPO.

## 2021-10-19 DIAGNOSIS — K219 Gastro-esophageal reflux disease without esophagitis: Secondary | ICD-10-CM | POA: Diagnosis not present

## 2021-10-19 DIAGNOSIS — E119 Type 2 diabetes mellitus without complications: Secondary | ICD-10-CM | POA: Diagnosis not present

## 2021-10-19 DIAGNOSIS — E785 Hyperlipidemia, unspecified: Secondary | ICD-10-CM | POA: Diagnosis not present

## 2021-10-19 DIAGNOSIS — R457 State of emotional shock and stress, unspecified: Secondary | ICD-10-CM | POA: Diagnosis not present

## 2021-10-19 DIAGNOSIS — R079 Chest pain, unspecified: Secondary | ICD-10-CM | POA: Diagnosis not present

## 2021-10-19 DIAGNOSIS — F419 Anxiety disorder, unspecified: Secondary | ICD-10-CM | POA: Diagnosis not present

## 2021-10-19 DIAGNOSIS — R0789 Other chest pain: Secondary | ICD-10-CM | POA: Diagnosis not present

## 2021-10-19 DIAGNOSIS — Z8542 Personal history of malignant neoplasm of other parts of uterus: Secondary | ICD-10-CM | POA: Diagnosis not present

## 2021-10-19 DIAGNOSIS — R9431 Abnormal electrocardiogram [ECG] [EKG]: Secondary | ICD-10-CM | POA: Diagnosis not present

## 2021-10-19 DIAGNOSIS — J45909 Unspecified asthma, uncomplicated: Secondary | ICD-10-CM | POA: Diagnosis not present

## 2021-10-19 DIAGNOSIS — R609 Edema, unspecified: Secondary | ICD-10-CM | POA: Diagnosis not present

## 2021-10-19 DIAGNOSIS — R0602 Shortness of breath: Secondary | ICD-10-CM | POA: Diagnosis not present

## 2021-10-19 DIAGNOSIS — R569 Unspecified convulsions: Secondary | ICD-10-CM | POA: Diagnosis not present

## 2021-10-19 DIAGNOSIS — I10 Essential (primary) hypertension: Secondary | ICD-10-CM | POA: Diagnosis not present

## 2021-10-19 DIAGNOSIS — R072 Precordial pain: Secondary | ICD-10-CM | POA: Diagnosis not present

## 2021-10-24 DIAGNOSIS — Z87891 Personal history of nicotine dependence: Secondary | ICD-10-CM | POA: Diagnosis not present

## 2021-10-24 DIAGNOSIS — I10 Essential (primary) hypertension: Secondary | ICD-10-CM | POA: Diagnosis not present

## 2021-10-24 DIAGNOSIS — R0789 Other chest pain: Secondary | ICD-10-CM | POA: Diagnosis not present

## 2021-10-24 DIAGNOSIS — R9431 Abnormal electrocardiogram [ECG] [EKG]: Secondary | ICD-10-CM | POA: Diagnosis not present

## 2021-10-24 IMAGING — DX DG CHEST 1V PORT
1 series · 1 of 1 positions shown · non-contrast
Comparison: 07/30/2020

CLINICAL DATA: Shortness of breath

EXAM:
PORTABLE CHEST 1 VIEW

[chest ap]
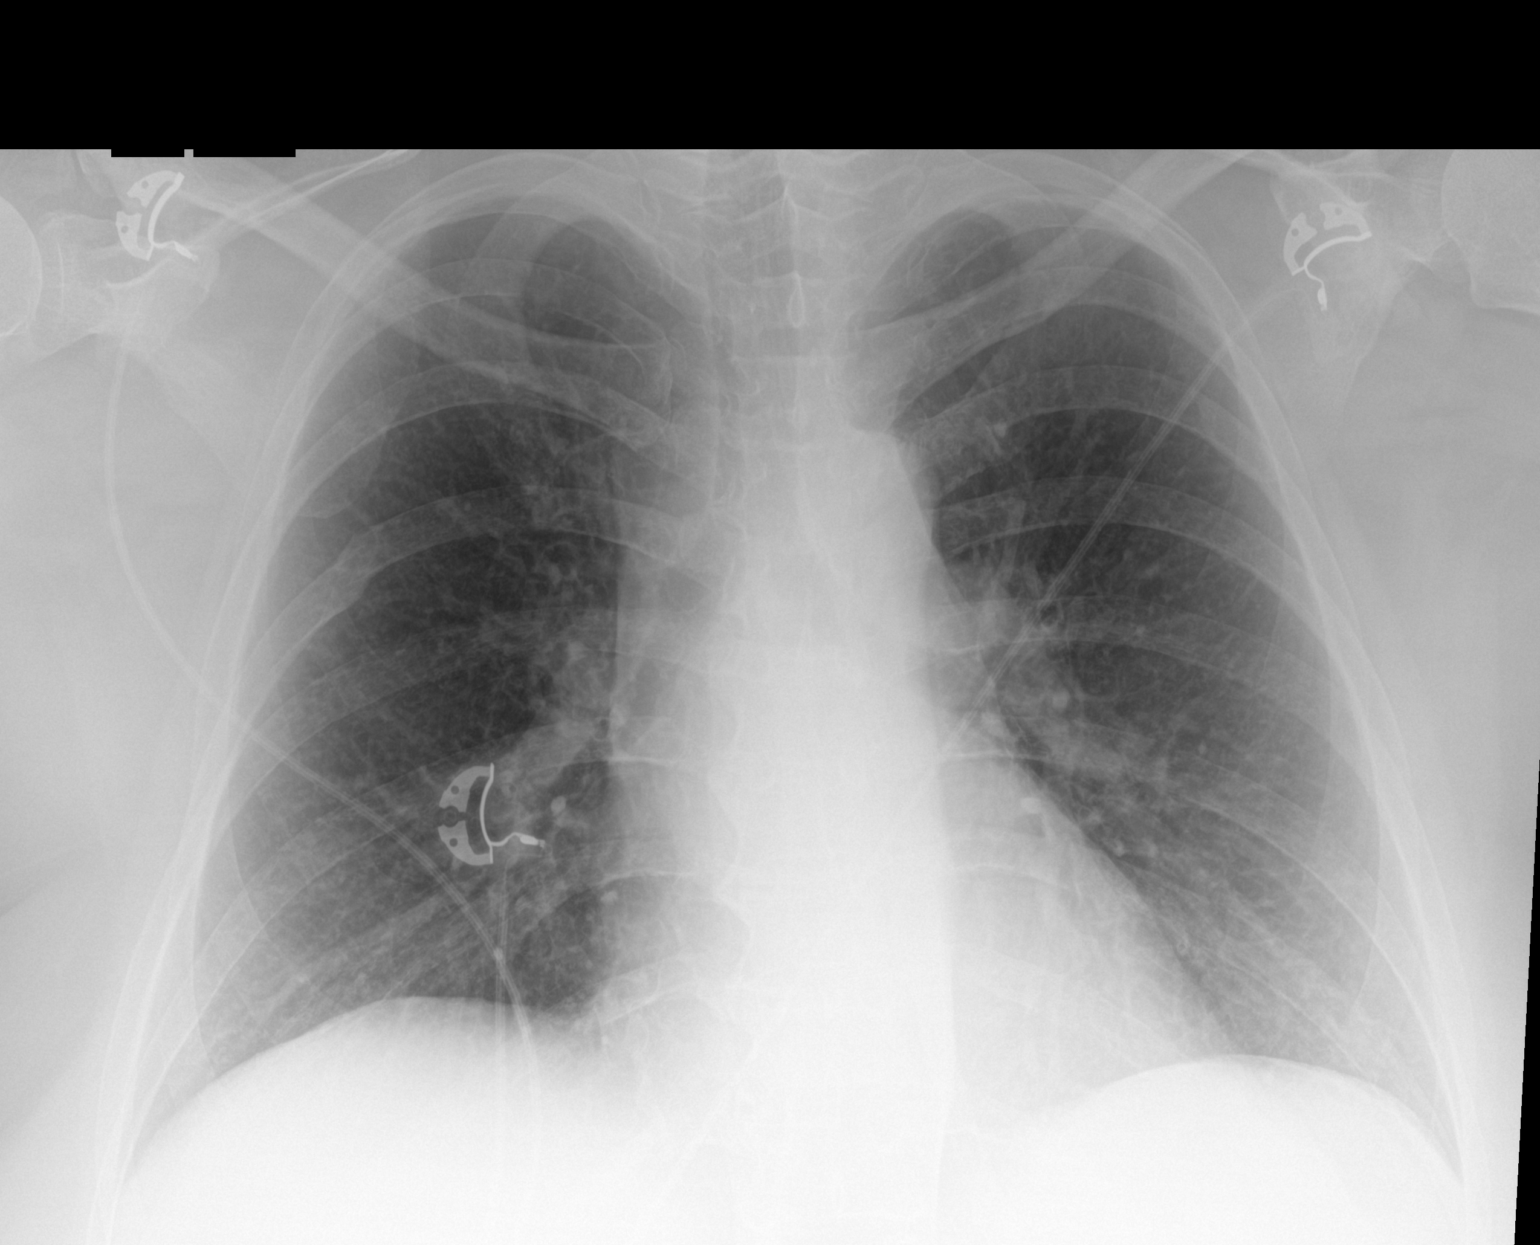

[1 of 1 positions shown; findings below may reference images not displayed]

FINDINGS: The heart size and mediastinal contours are within normal limits.
Both lungs are clear. The visualized skeletal structures are
unremarkable.
IMPRESSION: No active disease.

## 2021-10-26 ENCOUNTER — Emergency Department (HOSPITAL_BASED_OUTPATIENT_CLINIC_OR_DEPARTMENT_OTHER): Payer: Medicare HMO

## 2021-10-26 ENCOUNTER — Other Ambulatory Visit: Payer: Self-pay

## 2021-10-26 ENCOUNTER — Emergency Department (HOSPITAL_BASED_OUTPATIENT_CLINIC_OR_DEPARTMENT_OTHER)
Admission: EM | Admit: 2021-10-26 | Discharge: 2021-10-26 | Disposition: A | Discharge status: Home / Self Care | Payer: Medicare HMO | Attending: Emergency Medicine | Admitting: Emergency Medicine

## 2021-10-26 ENCOUNTER — Telehealth: Payer: Self-pay

## 2021-10-26 ENCOUNTER — Encounter (HOSPITAL_BASED_OUTPATIENT_CLINIC_OR_DEPARTMENT_OTHER): Payer: Self-pay | Admitting: Emergency Medicine

## 2021-10-26 ENCOUNTER — Other Ambulatory Visit (HOSPITAL_BASED_OUTPATIENT_CLINIC_OR_DEPARTMENT_OTHER): Payer: Self-pay

## 2021-10-26 DIAGNOSIS — K5792 Diverticulitis of intestine, part unspecified, without perforation or abscess without bleeding: Secondary | ICD-10-CM

## 2021-10-26 DIAGNOSIS — Z79899 Other long term (current) drug therapy: Secondary | ICD-10-CM | POA: Insufficient documentation

## 2021-10-26 DIAGNOSIS — R1084 Generalized abdominal pain: Secondary | ICD-10-CM | POA: Diagnosis not present

## 2021-10-26 DIAGNOSIS — R309 Painful micturition, unspecified: Secondary | ICD-10-CM | POA: Diagnosis not present

## 2021-10-26 DIAGNOSIS — R110 Nausea: Secondary | ICD-10-CM | POA: Diagnosis not present

## 2021-10-26 DIAGNOSIS — I10 Essential (primary) hypertension: Secondary | ICD-10-CM | POA: Insufficient documentation

## 2021-10-26 DIAGNOSIS — R195 Other fecal abnormalities: Secondary | ICD-10-CM | POA: Diagnosis not present

## 2021-10-26 DIAGNOSIS — R109 Unspecified abdominal pain: Secondary | ICD-10-CM | POA: Diagnosis not present

## 2021-10-26 DIAGNOSIS — R1032 Left lower quadrant pain: Secondary | ICD-10-CM | POA: Insufficient documentation

## 2021-10-26 LAB — URINALYSIS, ROUTINE W REFLEX MICROSCOPIC
Bilirubin Urine: NEGATIVE
Glucose, UA: NEGATIVE mg/dL
Hgb urine dipstick: NEGATIVE
Ketones, ur: NEGATIVE mg/dL
Leukocytes,Ua: NEGATIVE
Nitrite: NEGATIVE
Protein, ur: NEGATIVE mg/dL
Specific Gravity, Urine: >=1.03 (ref 1.005–1.030)
pH: 5.5 (ref 5.0–8.0)

## 2021-10-26 LAB — BASIC METABOLIC PANEL
Anion gap: 9 (ref 5–15)
BUN: 15 mg/dL (ref 6–20)
CO2: 24 mmol/L (ref 22–32)
Calcium: 8.5 mg/dL — ABNORMAL LOW (ref 8.9–10.3)
Chloride: 105 mmol/L (ref 98–111)
Creatinine, Ser: 0.71 mg/dL (ref 0.44–1.00)
GFR, Estimated: >60 mL/min (ref >60)
Glucose, Bld: 189 mg/dL — ABNORMAL HIGH (ref 70–99)
Potassium: 3.4 mmol/L — ABNORMAL LOW (ref 3.5–5.1)
Sodium: 138 mmol/L (ref 135–145)

## 2021-10-26 LAB — CBC WITH DIFFERENTIAL/PLATELET
Abs Immature Granulocytes: 0.02 10*3/uL (ref 0.00–0.07)
Basophils Absolute: 0.1 10*3/uL (ref 0.0–0.1)
Basophils Relative: 1 %
Eosinophils Absolute: 0.2 10*3/uL (ref 0.0–0.5)
Eosinophils Relative: 3 %
HCT: 42.4 % (ref 36.0–46.0)
Hemoglobin: 14.1 g/dL (ref 12.0–15.0)
Immature Granulocytes: 0 %
Lymphocytes Relative: 19 %
Lymphs Abs: 1.4 10*3/uL (ref 0.7–4.0)
MCH: 30.2 pg (ref 26.0–34.0)
MCHC: 33.3 g/dL (ref 30.0–36.0)
MCV: 90.8 fL (ref 80.0–100.0)
Monocytes Absolute: 0.6 10*3/uL (ref 0.1–1.0)
Monocytes Relative: 8 %
Neutro Abs: 5.1 10*3/uL (ref 1.7–7.7)
Neutrophils Relative %: 69 %
Platelets: 181 10*3/uL (ref 150–400)
RBC: 4.67 MIL/uL (ref 3.87–5.11)
RDW: 13.1 % (ref 11.5–15.5)
WBC: 7.4 10*3/uL (ref 4.0–10.5)
nRBC: 0 % (ref 0.0–0.2)

## 2021-10-26 LAB — HEPATIC FUNCTION PANEL
ALT: 17 U/L (ref 0–44)
AST: 14 U/L — ABNORMAL LOW (ref 15–41)
Albumin: 3.6 g/dL (ref 3.5–5.0)
Alkaline Phosphatase: 72 U/L (ref 38–126)
Bilirubin, Direct: 0.1 mg/dL (ref 0.0–0.2)
Indirect Bilirubin: 0.5 mg/dL (ref 0.3–0.9)
Total Bilirubin: 0.6 mg/dL (ref 0.3–1.2)
Total Protein: 6.8 g/dL (ref 6.5–8.1)

## 2021-10-26 LAB — LIPASE, BLOOD: Lipase: 39 U/L (ref 11–51)

## 2021-10-26 MED ORDER — DICYCLOMINE HCL 10 MG/ML IM SOLN
20.0000 mg | Freq: Once | INTRAMUSCULAR | Status: AC
  Administered 2021-10-26: 20 mg via INTRAMUSCULAR
  Filled 2021-10-26: qty 2

## 2021-10-26 MED ORDER — CLINDAMYCIN HCL 150 MG PO CAPS
150.0000 mg | ORAL_CAPSULE | Freq: Four times a day (QID) | ORAL | 0 refills | Status: AC
  Filled 2021-10-26: qty 28, 7d supply, fill #0

## 2021-10-26 MED ORDER — DICYCLOMINE HCL 20 MG PO TABS
20.0000 mg | ORAL_TABLET | Freq: Two times a day (BID) | ORAL | 0 refills | Status: DC | PRN
  Filled 2021-10-26: qty 20, 10d supply, fill #0

## 2021-10-26 MED ORDER — ONDANSETRON 4 MG PO TBDP
4.0000 mg | ORAL_TABLET | Freq: Three times a day (TID) | ORAL | 0 refills | Status: DC | PRN
  Filled 2021-10-26: qty 20, 7d supply, fill #0

## 2021-10-26 NOTE — ED Provider Notes (Signed)
?[facility] EMERGENCY DEPARTMENT ?Provider Note ? ? ?CSN: [id number] ?Arrival date & time: 10/26/21  0017 ? ?  ? ?History ? ?Chief Complaint  ?Patient presents with  ? Abdominal Pain  ? ? ?Jennifer [name] is a 56 y.o. female. ? ? ?Abdominal Pain ?Pain location:  LLQ ?Pain quality: aching   ?Pain radiates to:  Does not radiate ?Pain severity:  Mild ?Onset quality:  Gradual ?Duration:  1 day ?Progression:  Worsening ?Chronicity:  Recurrent ?Relieved by:  None tried ? ?  ? ?Home Medications ?Prior to Admission medications   ?Medication Sig Start Date End Date Taking? Authorizing Provider  ?ACCU-CHEK GUIDE test strip For testing blood sugars dailyDx:r73.03 06/17/21   [name], MD  ?Accu-Chek Softclix Lancets lancets Dx:r73.03 06/17/21   [name], MD  ?amLODipine (NORVASC) 2.5 MG tablet Take 1 tablet (2.5 mg total) by mouth daily. 10/14/21   [name], MD  ?EPINEPHrine 0.3 mg/0.3 mL IJ SOAJ injection SMARTSIG:0.3 Milligram(s) IM Once 05/18/21   [provider]  ?famotidine (PEPCID) 20 MG tablet Take by mouth. 05/06/21   [provider]  ?flunisolide (NASALIDE) 25 MCG/ACT (0.025%) SOLN Place into the nose. 05/27/21   [provider]  ?levalbuterol Penne Lash HFA) 45 MCG/ACT inhaler INHALE 2 PUFFS INTO THE LUNGS EVERY 6 HOURS AS NEEDED FOR WHEEZING 09/07/21   Padgett, Rae Halsted, MD  ?levalbuterol Penne Lash) 1.25 MG/3ML nebulizer solution Take 1.25 mg by nebulization every 3 (three) hours as needed for wheezing. 09/28/20   Valentina Shaggy, MD  ?metoprolol tartrate (LOPRESSOR) 25 MG tablet Take 1 tablet (25 mg total) by mouth in the morning, at noon, and at bedtime. 09/08/21   [name], MD  ?metoprolol tartrate (LOPRESSOR) 50 MG tablet Take 1 tablet (50 mg total) by mouth 2 (two) times daily. 09/30/21   [name], MD  ?potassium chloride (KLOR-CON) 20 MEQ packet Take 20 mEq by mouth daily. 09/09/21   [name], MD   ?   ? ?Allergies    ?Azithromycin, Ciprofloxacin, Codeine, Erythromycin base, Sulfa antibiotics, Sulfasalazine, Telmisartan, Ace inhibitors, Aspirin, Atenolol, Avelox [moxifloxacin hcl in nacl], Beta adrenergic blockers, Buspar [buspirone], Butorphanol tartrate, Cetirizine, Clonidine hcl, Cortisone, Erythromycin, Fentanyl, Fluoxetine hcl, Ketorolac tromethamine, Lidocaine, Lisinopril, Metoclopramide hcl, Midazolam, Montelukast, Montelukast sodium, Naproxen, Paroxetine, Penicillins, Pravastatin, Promethazine, Promethazine hcl, Quinolones, Serotonin reuptake inhibitors (ssris), Sertraline hcl, Stelazine [trifluoperazine], Tobramycin, Trifluoperazine hcl, Atrovent nasal spray [ipratropium], Diltiazem, Polyethylene glycol 3350, Propoxyphene, Adhesive [tape], Butorphanol, Ceftriaxone, Iron, Metoclopramide, Metronidazole, Other, Prednisone, Prochlorperazine, Venlafaxine, and Zyrtec [cetirizine hcl]   ? ?Review of Systems   ?Review of Systems  ?Gastrointestinal:  Positive for abdominal pain.  ? ?Physical Exam ?Updated Vital Signs ?BP (!) 160/82 (BP Location: Left Arm)   Pulse 96   Temp 98.3 ?F (36.8 ?C) (Oral)   Resp 16   Ht '5\' 2"'$  (1.575 m)   Wt 106.5 kg   LMP 06/25/2013   SpO2 97%   BMI 42.95 kg/m?  ?Physical Exam ?Vitals and nursing note reviewed.  ?Constitutional:   ?   Appearance: She is well-developed.  ?HENT:  ?   Head: Normocephalic and atraumatic.  ?   Mouth/Throat:  ?   Mouth: Mucous membranes are moist.  ?   Pharynx: Oropharynx is clear.  ?Eyes:  ?   Pupils: Pupils are equal, round, and reactive to light.  ?Cardiovascular:  ?   Rate and Rhythm: Normal rate and regular rhythm.  ?Pulmonary:  ?   Effort: No respiratory distress.  ?  Breath sounds: No stridor.  ?Abdominal:  ?   General: There is no distension.  ?   Tenderness: There is no abdominal tenderness. There is no rebound.  ?   Hernia: No hernia is present.  ?Musculoskeletal:     ?   General: No swelling or tenderness. Normal range of motion.  ?    Cervical back: Normal range of motion.  ?Skin: ?   General: Skin is warm and dry.  ?Neurological:  ?   General: No focal deficit present.  ?   Mental Status: She is alert.  ? ? ?ED Results / Procedures / Treatments   ?Labs ?(all labs ordered are listed, but only abnormal results are displayed) ?Labs Reviewed  ?URINALYSIS, ROUTINE W REFLEX MICROSCOPIC  ?CBC WITH DIFFERENTIAL/PLATELET  ?BASIC METABOLIC PANEL  ? ? ?EKG ?None ? ?Radiology ?No results found. ? ?Procedures ?Procedures  ? ? ?Medications Ordered in ED ?Medications - No data to display ? ?ED Course/ Medical Decision Making/ A&P ?  ?                        ?Medical Decision Making ?Amount and/or Complexity of Data Reviewed ?Labs: ordered. ?Radiology: ordered. ? ?Risk ?Prescription drug management. ? ? ?Here with pressure with urination and associated suprapubic abdominal fullness. No nausea. Tried dulcolax and tylenol without relief. Presents here for evaluation of same. Multiple recent labs reviewed in care everywhere, will recheck same. Also check urine. If these are normal, patient can be discharged. Will give PRN tylenol but avoid any other more specific meds or narcotics pending workup and if something abnormal. Will hold on imaging with multiple ct scans in past without findings and recurrent nonspecific symptoms.  ? ?Final Clinical Impression(s) / ED Diagnoses ?Final diagnoses:  ?None  ? ? ?Rx / DC Orders ?ED Discharge Orders   ? ? None  ? ?  ? ? ?  ?Merrily Pew, MD ?11/02/21 2330 ? ?

## 2021-10-26 NOTE — ED Provider Notes (Signed)
?  Physical Exam  ?BP (!) 160/82 (BP Location: Left Arm)   Pulse 96   Temp 98.3 ?F (36.8 ?C) (Oral)   Resp 16   Ht '5\' 2"'$  (1.575 m)   Wt 106.5 kg   LMP 06/25/2013   SpO2 97%   BMI 42.95 kg/m?  ? ?Physical Exam ? ?Procedures  ?Procedures ? ?ED Course / MDM  ?  ? ?56 year old female with a history of hypertension, hyperlipidemia, hyperaldosteronism, sleep apnea, PTSD, Paget's disease, MS, uterine cancer, frequent emergency department visits with care plan in place, who presents with concern for lower abdominal pain. ? ?Received care from Dr. Dayna Barker.  She reports lower abdominal pain beginning yesterday and worse on the left side, with feeling of inability to pass normal stool, passing mucus, and nausea. ? ?Labs were personally evaluated by me and showed no sign of pancreatitis, significant electrolyte abnormality, hepatitis, or leukocytosis. ? ?While she has had a frequency of CT abdomen pelvis in the past, it appears she has not had CT abdomen pelvis for approximately 1 year, and has not been noted to have diverticulosis in the past.  ? ?CT abdomen pelvis ordered to evaluate for signs of diverticulitis or complications of such ? ?CT shows 3.5cm area concerning for diverticulitis, and discussed possibility that carcinoma can appear similar and given flat stools recently recommend Gastroenterology follow up.  ? ?Discussed current recommendations for diverticulitis are for supportive care, however we historically gave antibiotics. She would like antibiotics for treatment of her diverticulitis--she has several allergies (including to fluoroquinolones, penicillins and flagyl) but reports she tolerates clindamycin well. Discussed risk of c diff, however she prefers treating with antibiotic despite this risk.  Will give '150mg'$  QID for one week, and otherwise recommend supportive care, bentyl, zofran. Discussed reasons to return. Patient discharged in stable condition with understanding of reasons to return.   ? ?Recommend follow-up with her primary care physician and gastroenterologist. ? ? ? ? ? ?  ?Gareth Morgan, MD ?10/26/21 0818 ? ?

## 2021-10-26 NOTE — ED Triage Notes (Signed)
Pt is c/o lower abd pain  About a week ago she started having some intermittent pains but as of yesterday around 1 pm the pain has been constant   Pt states the pain is worse on the left side  Pt states she took a laxative last night and this morning she states she only passed some mucous  States had a BM yesterday  States has some pressure with urination but no burning or pain  States she has had nausea and had one bout of vomiting yesterday   ?

## 2021-10-26 NOTE — Telephone Encounter (Signed)
Pt called stating that she was seen at ED earlier today and diagnosed with diverticulitis.  She was prescribed Bentyl, clindamycin and Zofran.  Pt states that she is in worse pain than she was earlier today.  Advised pt to return to ED for evaluation and treatment.  Pt requested that I inform Dr. Madilyn Fireman of her current situation.  Verbally advised Dr. Madilyn Fireman and she agreed with plan.  Charyl Bigger, CMA ?

## 2021-10-27 DIAGNOSIS — R1084 Generalized abdominal pain: Secondary | ICD-10-CM | POA: Diagnosis not present

## 2021-10-28 ENCOUNTER — Other Ambulatory Visit: Payer: Self-pay

## 2021-10-28 ENCOUNTER — Ambulatory Visit (INDEPENDENT_AMBULATORY_CARE_PROVIDER_SITE_OTHER): Payer: Medicare HMO | Admitting: Family Medicine

## 2021-10-28 ENCOUNTER — Encounter: Payer: Self-pay | Admitting: Family Medicine

## 2021-10-28 VITALS — BP 160/84 | HR 82 | Temp 98.6°F | Resp 18 | Ht 62.0 in | Wt 233.0 lb

## 2021-10-28 DIAGNOSIS — D510 Vitamin B12 deficiency anemia due to intrinsic factor deficiency: Secondary | ICD-10-CM | POA: Diagnosis not present

## 2021-10-28 DIAGNOSIS — R110 Nausea: Secondary | ICD-10-CM | POA: Diagnosis not present

## 2021-10-28 DIAGNOSIS — R195 Other fecal abnormalities: Secondary | ICD-10-CM

## 2021-10-28 DIAGNOSIS — K5732 Diverticulitis of large intestine without perforation or abscess without bleeding: Secondary | ICD-10-CM

## 2021-10-28 DIAGNOSIS — I10 Essential (primary) hypertension: Secondary | ICD-10-CM | POA: Diagnosis not present

## 2021-10-28 NOTE — Patient Instructions (Signed)
Please continue your antibiotics as prescribed. ?Continue pushing plenty of fluids. ?Recommend a liquid and/or soft diet avoid greasy, spicy or heavy foods with oils or milk products except for the culture yogurt that you are eating. ?

## 2021-10-28 NOTE — Assessment & Plan Note (Signed)
Blood pressure uncontrolled today but she is in some pain.  Continue with current regimen. ?
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
## 2021-10-28 NOTE — Assessment & Plan Note (Signed)
Due for B12 today but will delay until her follow-up in 2 weeks, since she is not feeling well. ?

## 2021-10-29 DIAGNOSIS — K2970 Gastritis, unspecified, without bleeding: Secondary | ICD-10-CM | POA: Diagnosis not present

## 2021-10-29 DIAGNOSIS — Z8669 Personal history of other diseases of the nervous system and sense organs: Secondary | ICD-10-CM | POA: Diagnosis not present

## 2021-10-29 DIAGNOSIS — Z884 Allergy status to anesthetic agent status: Secondary | ICD-10-CM | POA: Diagnosis not present

## 2021-10-29 DIAGNOSIS — Z87891 Personal history of nicotine dependence: Secondary | ICD-10-CM | POA: Diagnosis not present

## 2021-10-29 DIAGNOSIS — R112 Nausea with vomiting, unspecified: Secondary | ICD-10-CM | POA: Diagnosis not present

## 2021-10-29 DIAGNOSIS — Z886 Allergy status to analgesic agent status: Secondary | ICD-10-CM | POA: Diagnosis not present

## 2021-10-29 DIAGNOSIS — R1084 Generalized abdominal pain: Secondary | ICD-10-CM | POA: Diagnosis not present

## 2021-10-29 DIAGNOSIS — R197 Diarrhea, unspecified: Secondary | ICD-10-CM | POA: Diagnosis not present

## 2021-10-29 DIAGNOSIS — E119 Type 2 diabetes mellitus without complications: Secondary | ICD-10-CM | POA: Diagnosis not present

## 2021-10-29 DIAGNOSIS — R531 Weakness: Secondary | ICD-10-CM | POA: Diagnosis not present

## 2021-10-29 DIAGNOSIS — E785 Hyperlipidemia, unspecified: Secondary | ICD-10-CM | POA: Diagnosis not present

## 2021-10-29 DIAGNOSIS — K5792 Diverticulitis of intestine, part unspecified, without perforation or abscess without bleeding: Secondary | ICD-10-CM | POA: Diagnosis not present

## 2021-10-29 DIAGNOSIS — K5732 Diverticulitis of large intestine without perforation or abscess without bleeding: Secondary | ICD-10-CM | POA: Diagnosis not present

## 2021-10-29 DIAGNOSIS — R064 Hyperventilation: Secondary | ICD-10-CM | POA: Diagnosis not present

## 2021-10-29 DIAGNOSIS — R000 Tachycardia, unspecified: Secondary | ICD-10-CM | POA: Diagnosis not present

## 2021-10-29 DIAGNOSIS — I10 Essential (primary) hypertension: Secondary | ICD-10-CM | POA: Diagnosis not present

## 2021-10-31 ENCOUNTER — Encounter: Payer: Self-pay | Admitting: Family Medicine

## 2021-10-31 DIAGNOSIS — I700 Atherosclerosis of aorta: Secondary | ICD-10-CM | POA: Diagnosis not present

## 2021-10-31 DIAGNOSIS — I10 Essential (primary) hypertension: Secondary | ICD-10-CM | POA: Diagnosis not present

## 2021-10-31 DIAGNOSIS — Z87891 Personal history of nicotine dependence: Secondary | ICD-10-CM | POA: Diagnosis not present

## 2021-10-31 DIAGNOSIS — N281 Cyst of kidney, acquired: Secondary | ICD-10-CM | POA: Diagnosis not present

## 2021-10-31 DIAGNOSIS — R197 Diarrhea, unspecified: Secondary | ICD-10-CM | POA: Diagnosis not present

## 2021-10-31 DIAGNOSIS — K5732 Diverticulitis of large intestine without perforation or abscess without bleeding: Secondary | ICD-10-CM | POA: Diagnosis not present

## 2021-10-31 MED ORDER — TRAMADOL HCL 50 MG PO TABS: 50.0000 mg | ORAL_TABLET | Freq: Three times a day (TID) | ORAL | 0 refills | Status: AC | PRN

## 2021-10-31 NOTE — Telephone Encounter (Signed)
Ok to stop the clindamycin ? ?Meds ordered this encounter  ?Medications  ? traMADol (ULTRAM) 50 MG tablet  ?  Sig: Take 1 tablet (50 mg total) by mouth every 8 (eight) hours as needed for up to 5 days.  ?  Dispense:  15 tablet  ?  Refill:  0  ? ? ?

## 2021-11-01 DIAGNOSIS — R9431 Abnormal electrocardiogram [ECG] [EKG]: Secondary | ICD-10-CM | POA: Diagnosis not present

## 2021-11-01 DIAGNOSIS — N281 Cyst of kidney, acquired: Secondary | ICD-10-CM | POA: Diagnosis not present

## 2021-11-01 DIAGNOSIS — K5732 Diverticulitis of large intestine without perforation or abscess without bleeding: Secondary | ICD-10-CM | POA: Diagnosis not present

## 2021-11-03 ENCOUNTER — Telehealth (INDEPENDENT_AMBULATORY_CARE_PROVIDER_SITE_OTHER): Payer: Medicare HMO | Admitting: Family Medicine

## 2021-11-03 ENCOUNTER — Encounter: Payer: Self-pay | Admitting: Family Medicine

## 2021-11-03 VITALS — HR 83 | Resp 18 | Ht 62.0 in | Wt 230.0 lb

## 2021-11-03 DIAGNOSIS — K2100 Gastro-esophageal reflux disease with esophagitis, without bleeding: Secondary | ICD-10-CM

## 2021-11-03 DIAGNOSIS — L539 Erythematous condition, unspecified: Secondary | ICD-10-CM | POA: Diagnosis not present

## 2021-11-03 DIAGNOSIS — T7840XA Allergy, unspecified, initial encounter: Secondary | ICD-10-CM

## 2021-11-03 MED ORDER — SUCRALFATE 1 GM/10ML PO SUSP: 1.0000 g | Freq: Three times a day (TID) | ORAL | 0 refills | Status: DC

## 2021-11-03 NOTE — Progress Notes (Signed)
? ? ?Virtual Visit via Video Note ? ?I connected with Jennifer [name] on 11/03/21 at  4:20 PM EDT by a video enabled telemedicine application and verified that I am speaking with the correct person using two identifiers. ?  ?I discussed the limitations of evaluation and management by telemedicine and the availability of in person appointments. The patient expressed understanding and agreed to proceed. ? ?Patient location: at home ?Provider location: in office ? ?Subjective:   ? ?CC:   ?Chief Complaint  ?Patient presents with  ? Rash  ?  On face/burns. Patient stated she woke up this morning with it.   ? Abdominal Pain  ?  Lower abdomen pain. Patient never started Tramadol to help with pain.   ? ? ?HPI: ?Face started feeling itchy and red last night after eating.  Notices bumps.  Used a new facial scrub this AM.   ?Chest feels really tight right now and did a puff on albuterol. Lips are very chapped.  No rash elsewhere. Use some triamcinolone.   ? ?Went to ED and for diarrhea on 3/20 and given IV fluids. She is off antibiotics.  She diarrhea is better. Diarrhea is unusual for her as she usually has issues with constipation. Can't get in with [facility].  ? ?Also splashed liquid from COVID test kit on her face.  ? ?Chest feels tender with pressure.  She is getting the loop recorder out next week.  She is having a lot of pressure in the center of chest.  ? ?Vaginal area is raw and irritated.  ? ?Past medical history, Surgical history, Family history not pertinant except as noted below, Social history, Allergies, and medications have been entered into the medical record, reviewed, and corrections made.  ? ? ?Objective:   ? ?General: Speaking clearly in complete sentences without any shortness of breath.  Alert and oriented x3.  Normal judgment. No apparent acute distress. ? ? ? ?Impression and Recommendations:   ? ?Problem List Items Addressed This Visit   ?None ?Visit Diagnoses   ? ? Gastroesophageal reflux disease with  esophagitis without hemorrhage    -  Primary  ? Relevant Medications  ? sucralfate (CARAFATE) 1 GM/10ML suspension  ? Allergic reaction, initial encounter      ? Facial erythema      ? ?  ? ? ?Allergic reaction - discussed taking Benadryl to help in case it is an allergic reaction.  Could be a contact dermatitis from the facial scrub or reaction from a food. The triamcinolone burned so just use a moisturizer that she has used before without problem.  ? ?Atypical chest pain - we discussed that stress can cause chest pain ans she is very worried about the facial redness. She used her inhaler. 2 puffs.  ? ?Anxiety - I feel the chest pain is getting worse from anxiety from going up.  Recommend taking 1/2 to 1/4 tab of the diazepam and see if that helps.  ? ?No orders of the defined types were placed in this encounter. ? ? ?Meds ordered this encounter  ?Medications  ? sucralfate (CARAFATE) 1 GM/10ML suspension  ?  Sig: Take 10 mLs (1 g total) by mouth 4 (four) times daily -  with meals and at bedtime.  ?  Dispense:  420 mL  ?  Refill:  0  ? ? ? ?I discussed the assessment and treatment plan with the patient. The patient was provided an opportunity to ask questions and all were answered. The  patient agreed with the plan and demonstrated an understanding of the instructions. ?  ?The patient was advised to call back or seek an in-person evaluation if the symptoms worsen or if the condition fails to improve as anticipated. ? ?I spent 45 minutes on the day of the encounter to include pre-visit record review, face-to-face time with the patient and post visit ordering of test. ? ? ?Beatrice Lecher, MD  ? ?

## 2021-11-04 ENCOUNTER — Telehealth: Payer: Self-pay | Admitting: Allergy

## 2021-11-04 DIAGNOSIS — L538 Other specified erythematous conditions: Secondary | ICD-10-CM | POA: Diagnosis not present

## 2021-11-04 DIAGNOSIS — E1165 Type 2 diabetes mellitus with hyperglycemia: Secondary | ICD-10-CM | POA: Diagnosis not present

## 2021-11-04 DIAGNOSIS — R0602 Shortness of breath: Secondary | ICD-10-CM | POA: Diagnosis not present

## 2021-11-04 DIAGNOSIS — R079 Chest pain, unspecified: Secondary | ICD-10-CM | POA: Diagnosis not present

## 2021-11-04 DIAGNOSIS — R072 Precordial pain: Secondary | ICD-10-CM | POA: Diagnosis not present

## 2021-11-04 DIAGNOSIS — L232 Allergic contact dermatitis due to cosmetics: Secondary | ICD-10-CM | POA: Diagnosis not present

## 2021-11-04 NOTE — Telephone Encounter (Signed)
Patient requesting a call from the nurse she is not feeling well asking for advice please advise  ?

## 2021-11-04 NOTE — Telephone Encounter (Signed)
Spoke with pt in person recommended pt go be seen at the er or urgent care about this and the swollen face pt stated understanding and made a follow up for thur 30th at 10am ?

## 2021-11-06 DIAGNOSIS — R079 Chest pain, unspecified: Secondary | ICD-10-CM | POA: Diagnosis not present

## 2021-11-07 DIAGNOSIS — R002 Palpitations: Secondary | ICD-10-CM | POA: Diagnosis not present

## 2021-11-07 DIAGNOSIS — Z4509 Encounter for adjustment and management of other cardiac device: Secondary | ICD-10-CM | POA: Diagnosis not present

## 2021-11-08 DIAGNOSIS — Z4509 Encounter for adjustment and management of other cardiac device: Secondary | ICD-10-CM | POA: Diagnosis not present

## 2021-11-08 DIAGNOSIS — I471 Supraventricular tachycardia: Secondary | ICD-10-CM | POA: Diagnosis not present

## 2021-11-08 DIAGNOSIS — R059 Cough, unspecified: Secondary | ICD-10-CM | POA: Diagnosis not present

## 2021-11-08 DIAGNOSIS — R1319 Other dysphagia: Secondary | ICD-10-CM | POA: Diagnosis not present

## 2021-11-09 NOTE — Patient Instructions (Addendum)
Moderate persistent asthma  ?-She is willing to retry Spiriva Respimat 1.25 mcg using 2 puffs once a day to help prevent cough and wheeze.  Demonstration given.  Discussed how we are limited on the therapies that we could use for her exacerbation due to her medication allergies and problems she has had with previous inhaled corticosteroid inhalers. ?- Continue Xopenex hfa 2 puffs every 6 hours as needed for cough, wheeze,tightness in chest, or shortness of breath OR Xopenex 1.25 mg 1 unit dose via nebulizer every 4-6 hours as needed ?- Continue to perform nasal saline rinses 1-3 times a day as needed to help clean sinus tract ?- Can use guaifenesin twice a day with large glass of water to help thin and mobilize mucus ?- Continue to use aerobika valve 1-2 times a day for 5-10 minutes  ? ?Rash (better now) ?-If your symptoms re-occur, begin a journal of events that occurred for up to 6 hours before your symptoms began including foods and beverages consumed, soaps or perfumes you had contact with, and medications.  ?-Previous lab work checking for food allergies was negative on July 16, 2020 ?-Consider patch testing in the future if necessary ? ?Reflux ?-Continue the regimen as prescribed by your gastroenterologist. ?-Keep your upcoming appointment with GI next week ?-Reports that proton pump inhibitors can cause more pressure and pain ? ?Chronic rhinitis ?-Previous lab work for environmental allergies negative ?-At your next appointment bring your medications with you so we can know what nose spray you are using ?-May use saline rinses as above ?-May use Claritin 10 mg once a day as needed since you do not report any problems with this medication ? ? ?Your blood pressure is extremely elevated today while in the office. You need to go to the emergency room  ?Schedule a follow up appointment in 2 months. ?

## 2021-11-10 ENCOUNTER — Ambulatory Visit (INDEPENDENT_AMBULATORY_CARE_PROVIDER_SITE_OTHER): Payer: Medicare HMO | Admitting: Family

## 2021-11-10 ENCOUNTER — Encounter: Payer: Self-pay | Admitting: Family

## 2021-11-10 VITALS — BP 180/90 | HR 80 | Temp 97.9°F | Resp 20

## 2021-11-10 DIAGNOSIS — J4540 Moderate persistent asthma, uncomplicated: Secondary | ICD-10-CM

## 2021-11-10 DIAGNOSIS — F419 Anxiety disorder, unspecified: Secondary | ICD-10-CM | POA: Diagnosis not present

## 2021-11-10 DIAGNOSIS — R9431 Abnormal electrocardiogram [ECG] [EKG]: Secondary | ICD-10-CM | POA: Diagnosis not present

## 2021-11-10 DIAGNOSIS — J310 Chronic rhinitis: Secondary | ICD-10-CM

## 2021-11-10 DIAGNOSIS — I10 Essential (primary) hypertension: Secondary | ICD-10-CM | POA: Diagnosis not present

## 2021-11-10 DIAGNOSIS — K219 Gastro-esophageal reflux disease without esophagitis: Secondary | ICD-10-CM | POA: Diagnosis not present

## 2021-11-10 DIAGNOSIS — L7682 Other postprocedural complications of skin and subcutaneous tissue: Secondary | ICD-10-CM | POA: Diagnosis not present

## 2021-11-10 DIAGNOSIS — Z87891 Personal history of nicotine dependence: Secondary | ICD-10-CM | POA: Diagnosis not present

## 2021-11-10 DIAGNOSIS — R079 Chest pain, unspecified: Secondary | ICD-10-CM | POA: Diagnosis not present

## 2021-11-10 DIAGNOSIS — R21 Rash and other nonspecific skin eruption: Secondary | ICD-10-CM | POA: Diagnosis not present

## 2021-11-10 MED ORDER — LEVALBUTEROL TARTRATE 45 MCG/ACT IN AERO: INHALATION_SPRAY | RESPIRATORY_TRACT | 1 refills | Status: DC

## 2021-11-10 MED ORDER — EPINEPHRINE 0.3 MG/0.3ML IJ SOAJ: INTRAMUSCULAR | 1 refills | Status: DC

## 2021-11-10 MED ORDER — SPIRIVA RESPIMAT 1.25 MCG/ACT IN AERS: 2.0000 | INHALATION_SPRAY | Freq: Every day | RESPIRATORY_TRACT | 5 refills | Status: DC

## 2021-11-10 NOTE — Progress Notes (Signed)
? ?[place] [address] ?Dept: [phone] ? ?FOLLOW UP NOTE ? ?Patient ID: Jennifer [name], female    DOB: 01-06-1966  Age: 56 y.o. MRN: [id number] ?Date of Office Visit: 11/10/2021 ? ?Assessment  ?Chief Complaint: Asthma ? ?HPI ?Jennifer [name] is a 56 year old female who presents today for an acute visit.  She was last seen on September 14, 2021 by Dr. Nelva Bush.  Since her last office visit she reports that this past Tuesday she had her loop recorder removed due to having chest pain.  She reports that the pain was so intense removing the loop recorder.  She reports she still feels out of it. ? ?Moderate persistent asthma is reported as not well controlled with Xopenex as needed.  She reports that her lungs feel like balloons and she is coughing more.  The cough is described as dry and as coughing fits.  She also reports nocturnal awakenings due to breathing problems.  She has tightness in her chest.  She denies fever, chills, wheezing and shortness of breath.  When she does use her Xopenex it does help.  She reports that she has been in and out of the emergency room several times for chest discomfort and diverticulitis.  She reports most recently she was given 2 mg of Decadron once a day and she felt really "tacky".  She also mentions that the Decadron did not really help.  She is not sure if it was enough to help.  She reports that they wanted to give her more but if so she would be in trouble with her blood sugar.  She also reports that steroids messed with her psyche.  She reports that she was told when living in [place] to never take Singulair again.  Arnuity caused blisters around her mouth.  She reports a lot of steroids caused her to get thrush and a sore throat.  She also reports that inhaled corticosteroids do not make a difference in her breathing.  When asked if she has ever tried Spiriva she reports that she does not know if she had side effects with this like with others.  She is  nervous to try another med.  She reports her breathing has been worse since having her last bout of COVID-19.  She reports that when she struggles for air it usually due to her upper airway.  She also wanted to let us know that she no longer sees pulmonary because it is too far to go there. ? ?Reflux is reported as not well controlled with famotidine once a day.  She reports that she is supposed to be taking famotidine twice a day but does not.  She reports that she feels a real bad burning sensation in her throat.  She is worried about having Barrett's esophagus and reports that she has had this in the past.  When she has taken proton pump inhibitors in the past they have caused more pressure and pain.  She has a follow-up with GI next week. ? ?Chronic rhinitis is reported as moderately controlled with a nasal spray that she is not certain of the name.  She is not able to take Zyrtec or Allegra.  Zyrtec has caused hives and itching.  She has never tried Xyzal.  She reports that she can take Claritin  in the past and did okay.  She does feel like Claritin does wear off.  She reports nasal congestion and postnasal drip when lying down.  She stopped  using saline rinse for a while because she wondered if it made her symptoms worse, but she used this morning and she felt opened up. ? ?Last week she reports that she developed red puffy itchy skin.  She feels like maybe it was the toothpaste that caused this.  She is not certain though.  She reports that she talked to her primary care physician and used a thin layer of steroid cream and took Benadryl.  It took a couple days for the redness then to fade away.  She denies any new products, no new medications, no bug bites, no one else in the household has this.  She was not sick, but then reports that she has had diverticulitis.  When this occurred she denies cardiorespiratory and gastrointestinal symptoms, but then reports that she has had chest discomfort due to the loop  recorder and that she has had GI symptoms due to diverticulitis.  She also then mentions that she felt short of breath.  She wonders if it was because she was eating more fast food such as pizza rolls.  When she eats certain peanuts she will feel sick in her throat and if she eats doughnuts, cakes, or drinks milk she will get sick. ? ? ?Drug Allergies:  ?Allergies  ?Allergen Reactions  ? Azithromycin Shortness Of Breath  ?  Lip swelling, SOB.  ?   ? Ciprofloxacin Swelling  ?  REACTION: tongue swells  ? Codeine Shortness Of Breath  ? Erythromycin Base Itching and Rash  ? Sulfa Antibiotics Shortness Of Breath, Rash and Other (See Comments)  ? Sulfasalazine Rash and Shortness Of Breath  ?  Other reaction(s): Other (See Comments) ?Other reaction(s): SHORTNESS OF BREATH  ? Telmisartan Swelling  ?  Tongue swelling, Micardis  ? Ace Inhibitors Cough  ? Aspirin Hives and Other (See Comments)  ?  flushing  ? Atenolol Other (See Comments)  ?  Squeezing chest sensation  ? Avelox [Moxifloxacin Hcl In Nacl] Itching  ?     ? Beta Adrenergic Blockers Other (See Comments)  ?  Feels like chest tightening labetalol, bystolic  ?Feels like chest tightening "Metoprolol" ?  ? Buspar [Buspirone] Other (See Comments)  ?  Light headed  ? Butorphanol Tartrate Other (See Comments)  ?  Patient aggitated  ? Cetirizine Hives and Rash  ?     ? Clonidine Hcl   ?  REACTION: makes blood pressure high  ? Cortisone   ?  Feels like she is going crazy  ? Erythromycin Rash  ? Fentanyl Other (See Comments)  ?  aggressive   ? Fluoxetine Hcl Other (See Comments)  ?  REACTION: headaches  ? Ketorolac Tromethamine   ?  jittery  ? Lidocaine Other (See Comments)  ?  When it involves the throat,   ? Lisinopril Cough  ? Metoclopramide Hcl Other (See Comments)  ?  Dystonic reaction  ? Midazolam Other (See Comments)  ?  agitation ?Slow to wake up  ? Montelukast Other (See Comments)  ?  Singulair  ? Montelukast Sodium Other (See Comments)  ?  DOES NOT  REMEMBER ? ?Don't remember-told not to take  ? Naproxen Other (See Comments)  ?  FLUSHING ?Pt states she took Ibuprofen today (10/08/19)  ? Paroxetine Other (See Comments)  ?  REACTION: headaches  ? Penicillins Rash  ? Pravastatin Other (See Comments)  ?  Myalgias  ? Promethazine Other (See Comments)  ?  Dystonic reaction  ? Promethazine Hcl Other (See Comments)  ?  jittery  ? Quinolones Swelling and Rash  ? Serotonin Reuptake Inhibitors (Ssris) Other (See Comments)  ?  Headache ?Effexor, prozac, zoloft,   ? Sertraline Hcl   ?  REACTION: headaches  ? Stelazine [Trifluoperazine] Other (See Comments)  ?  Dystonic reaction  ? Tobramycin Itching and Rash  ? Trifluoperazine Hcl   ?  dystonic  ? Atrovent Nasal Spray [Ipratropium]   ?  Tachycardia and shaking  ? Diltiazem Other (See Comments)  ?  Chest pain  ? Polyethylene Glycol 3350   ?  Other reaction(s): Laryngeal Edema (ALLERGY)  ? Propoxyphene   ? Adhesive [Tape] Rash  ?  EKG monitor patches, some tapes ?Blisters, rash, itching, welts.  ? Butorphanol Anxiety  ?  Patient agitated  ? Ceftriaxone Rash  ?  rocephin  ? Iron Rash  ?  Flushing with certain IV types  ? Metoclopramide Itching and Other (See Comments)  ?  Dystonic reaction  ? Metronidazole Rash  ? Other Rash and Other (See Comments)  ?  Uncoded Allergy. Allergen: steriods, Other Reaction: Not Assessed ?Other reaction(s): Flushing (ALLERGY/intolerance), GI Upset (intolerance), Hypertension (intolerance), Increased Heart Rate (intolerance), Mental Status Changes (intolerance), Other (See Comments), Tachycardia / Palpitations  (intolerance) ?Hospital gowns leave a rash. ?  ? Prednisone Anxiety and Palpitations  ? Prochlorperazine Anxiety  ?  Compazine:  Dystonic reaction  ? Venlafaxine Anxiety  ? Zyrtec [Cetirizine Hcl] Rash  ?  All over body  ? ? ?Review of Systems: ?Review of Systems  ?Constitutional:  Negative for chills and fever.  ?     Reports feeling worn down  ?HENT:    ?     Reports nasal congestion and  post nasal drip when lying down. Denies rhinorrhea  ?Eyes:   ?     Reports itchy eyes at times  ?Respiratory:  Positive for cough. Negative for wheezing.   ?     Reports that her lungs feel like balloons, nocturnal awakenin

## 2021-11-12 DIAGNOSIS — Z87891 Personal history of nicotine dependence: Secondary | ICD-10-CM | POA: Diagnosis not present

## 2021-11-12 DIAGNOSIS — R0602 Shortness of breath: Secondary | ICD-10-CM | POA: Diagnosis not present

## 2021-11-12 DIAGNOSIS — I10 Essential (primary) hypertension: Secondary | ICD-10-CM | POA: Diagnosis not present

## 2021-11-12 DIAGNOSIS — R000 Tachycardia, unspecified: Secondary | ICD-10-CM | POA: Diagnosis not present

## 2021-11-13 IMAGING — DX DG KNEE COMPLETE 4+V*L*
4 series · 4 of 4 positions shown · non-contrast
Comparison: None.

CLINICAL DATA: Left knee pain laterally and anteriorly after fall
July 22, 2020.

EXAM:
LEFT KNEE - COMPLETE 4+ VIEW

[knee ap]
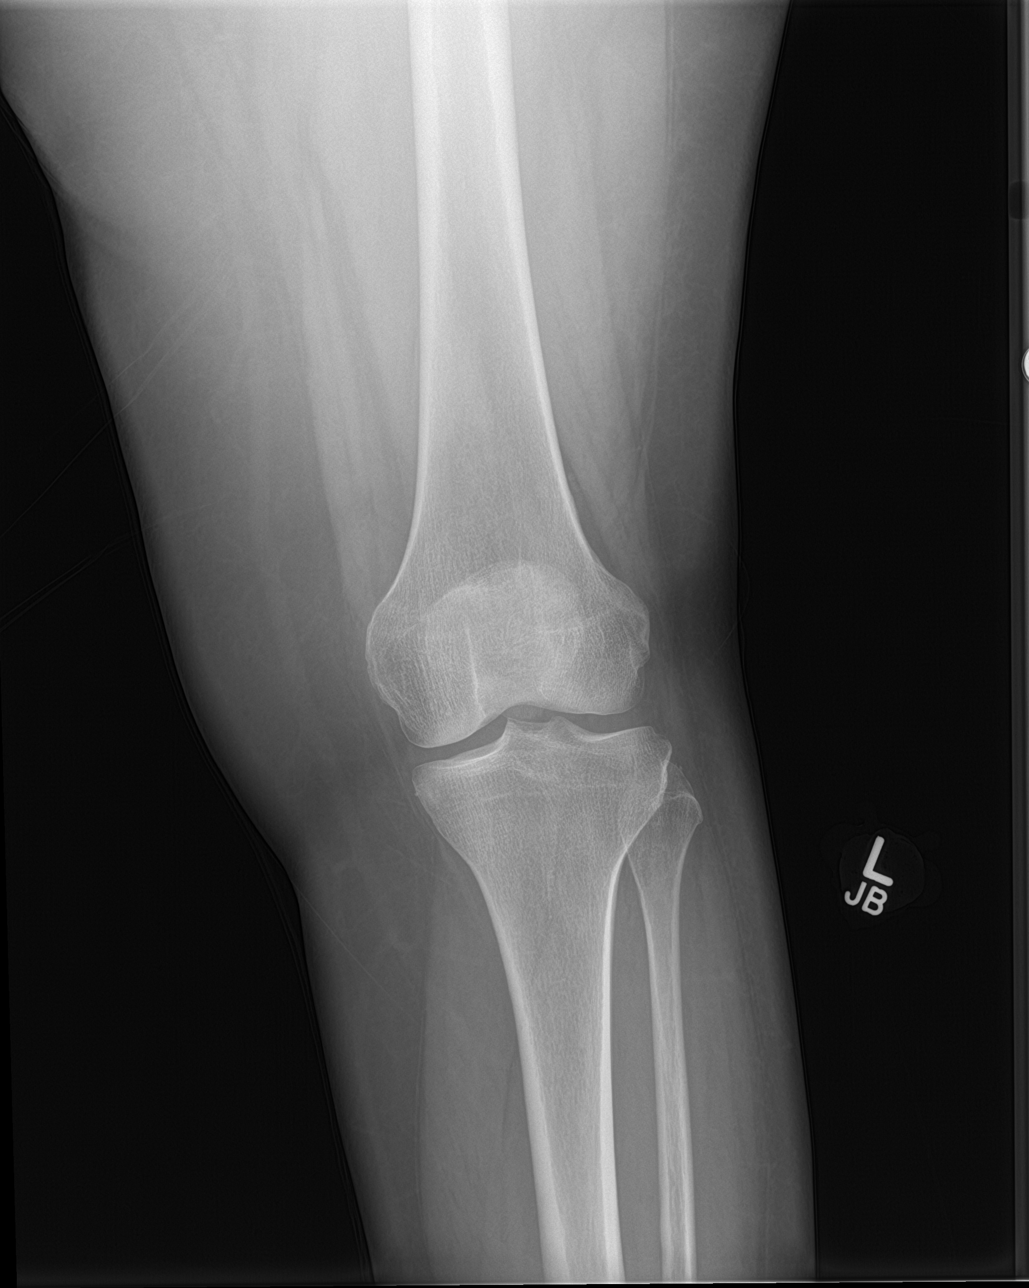

[knee lat]
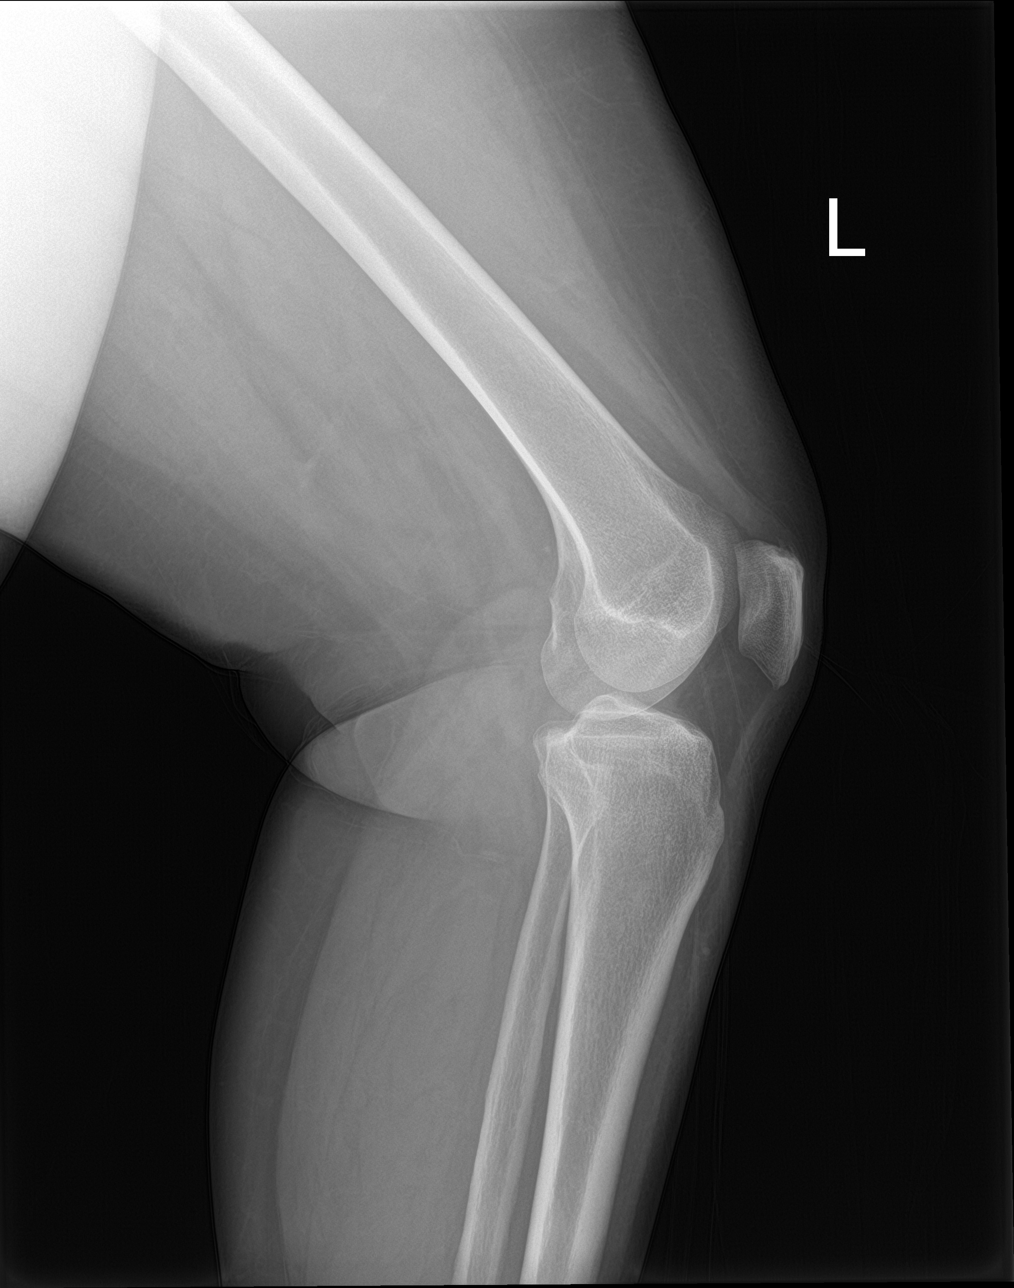

[knee obl (1 of 2)]
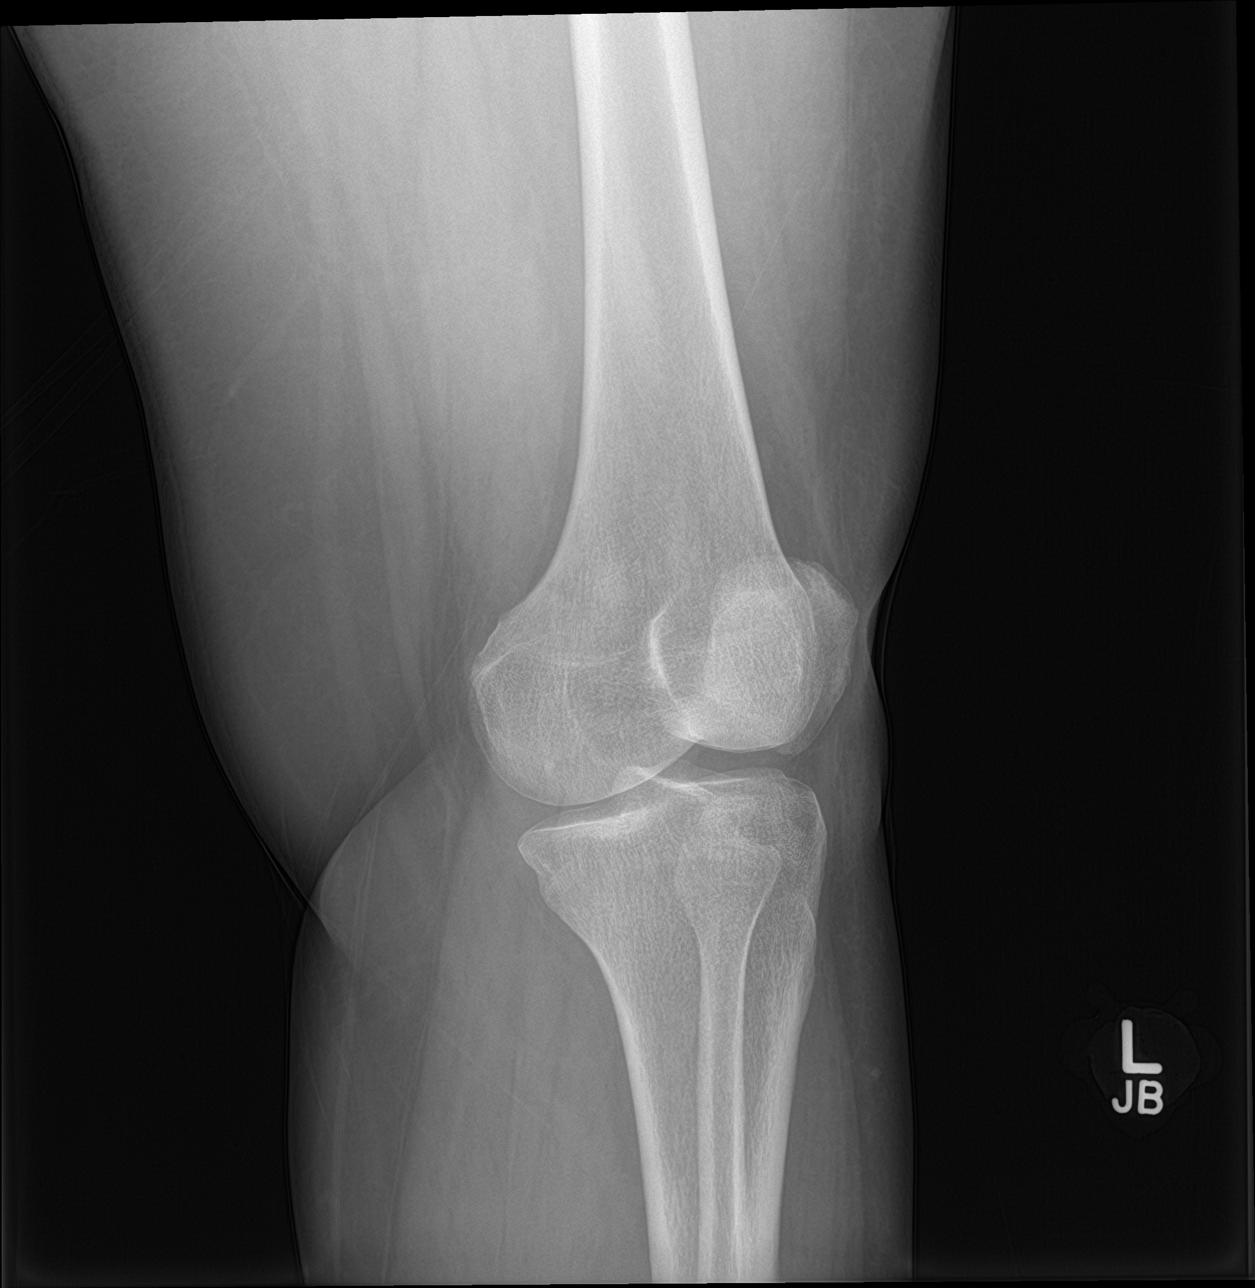

[knee obl (2 of 2)]
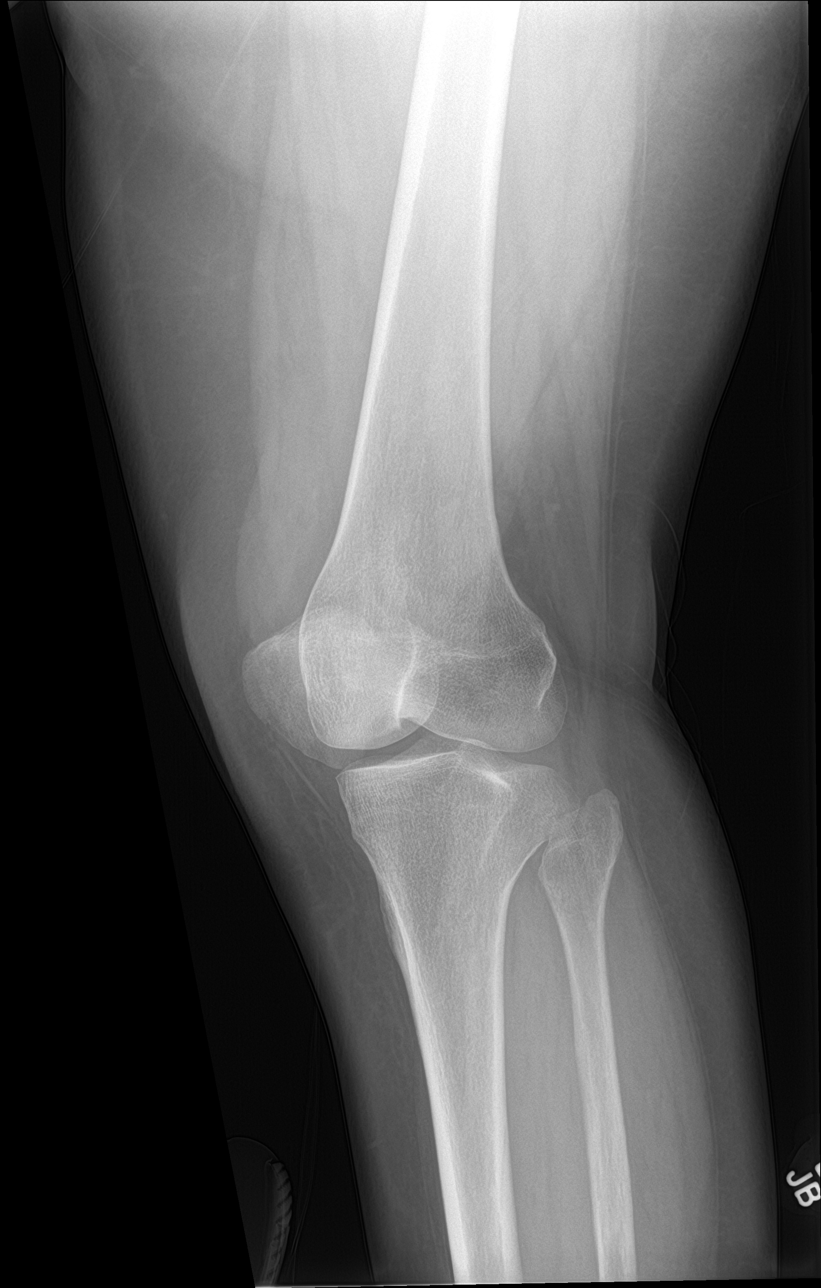

[4 of 4 positions shown; findings below may reference images not displayed]

FINDINGS: No evidence of fracture, dislocation, or joint effusion. No evidence
of arthropathy or other focal bone abnormality. Soft tissues are
unremarkable.
IMPRESSION: Negative.

## 2021-11-14 DIAGNOSIS — R000 Tachycardia, unspecified: Secondary | ICD-10-CM | POA: Diagnosis not present

## 2021-11-14 DIAGNOSIS — R079 Chest pain, unspecified: Secondary | ICD-10-CM | POA: Diagnosis not present

## 2021-11-15 ENCOUNTER — Other Ambulatory Visit: Payer: Self-pay

## 2021-11-15 ENCOUNTER — Emergency Department (HOSPITAL_BASED_OUTPATIENT_CLINIC_OR_DEPARTMENT_OTHER)
Admission: EM | Admit: 2021-11-15 | Discharge: 2021-11-15 | Disposition: A | Discharge status: Home / Self Care | Payer: Medicare HMO | Attending: Emergency Medicine | Admitting: Emergency Medicine

## 2021-11-15 ENCOUNTER — Emergency Department (HOSPITAL_BASED_OUTPATIENT_CLINIC_OR_DEPARTMENT_OTHER): Payer: Medicare HMO

## 2021-11-15 ENCOUNTER — Encounter (HOSPITAL_BASED_OUTPATIENT_CLINIC_OR_DEPARTMENT_OTHER): Payer: Self-pay | Admitting: Emergency Medicine

## 2021-11-15 DIAGNOSIS — R0600 Dyspnea, unspecified: Secondary | ICD-10-CM

## 2021-11-15 DIAGNOSIS — Z79899 Other long term (current) drug therapy: Secondary | ICD-10-CM | POA: Insufficient documentation

## 2021-11-15 DIAGNOSIS — J45909 Unspecified asthma, uncomplicated: Secondary | ICD-10-CM | POA: Diagnosis not present

## 2021-11-15 DIAGNOSIS — Z7951 Long term (current) use of inhaled steroids: Secondary | ICD-10-CM | POA: Insufficient documentation

## 2021-11-15 DIAGNOSIS — R000 Tachycardia, unspecified: Secondary | ICD-10-CM | POA: Insufficient documentation

## 2021-11-15 DIAGNOSIS — R0602 Shortness of breath: Secondary | ICD-10-CM | POA: Diagnosis not present

## 2021-11-15 LAB — CBC WITH DIFFERENTIAL/PLATELET
Abs Immature Granulocytes: 0.03 10*3/uL (ref 0.00–0.07)
Basophils Absolute: 0.1 10*3/uL (ref 0.0–0.1)
Basophils Relative: 1 %
Eosinophils Absolute: 0.2 10*3/uL (ref 0.0–0.5)
Eosinophils Relative: 2 %
HCT: 43.9 % (ref 36.0–46.0)
Hemoglobin: 14.5 g/dL (ref 12.0–15.0)
Immature Granulocytes: 0 %
Lymphocytes Relative: 21 %
Lymphs Abs: 1.8 10*3/uL (ref 0.7–4.0)
MCH: 29.7 pg (ref 26.0–34.0)
MCHC: 33 g/dL (ref 30.0–36.0)
MCV: 89.8 fL (ref 80.0–100.0)
Monocytes Absolute: 0.8 10*3/uL (ref 0.1–1.0)
Monocytes Relative: 9 %
Neutro Abs: 5.7 10*3/uL (ref 1.7–7.7)
Neutrophils Relative %: 67 %
Platelets: 189 10*3/uL (ref 150–400)
RBC: 4.89 MIL/uL (ref 3.87–5.11)
RDW: 13.2 % (ref 11.5–15.5)
WBC: 8.5 10*3/uL (ref 4.0–10.5)
nRBC: 0 % (ref 0.0–0.2)

## 2021-11-15 LAB — BASIC METABOLIC PANEL
Anion gap: 8 (ref 5–15)
BUN: 17 mg/dL (ref 6–20)
CO2: 24 mmol/L (ref 22–32)
Calcium: 9 mg/dL (ref 8.9–10.3)
Chloride: 109 mmol/L (ref 98–111)
Creatinine, Ser: 0.68 mg/dL (ref 0.44–1.00)
GFR, Estimated: >60 mL/min (ref >60)
Glucose, Bld: 114 mg/dL — ABNORMAL HIGH (ref 70–99)
Potassium: 3.8 mmol/L (ref 3.5–5.1)
Sodium: 141 mmol/L (ref 135–145)

## 2021-11-15 LAB — D-DIMER, QUANTITATIVE: D-Dimer, Quant: <0.27 ug/mL-FEU (ref 0.00–0.50)

## 2021-11-15 LAB — TROPONIN I (HIGH SENSITIVITY): Troponin I (High Sensitivity): 3 ng/L (ref <18)

## 2021-11-15 NOTE — Discharge Instructions (Addendum)
Follow-up with your primary care doctor.  Return to ER as needed. ?

## 2021-11-15 NOTE — ED Provider Notes (Signed)
?[facility] EMERGENC[facility] DEPARTMENT ?Provider Note ? ? ?CSN: [id number] ?Arrival date & time: 11/15/21  1626 ? ?  ? ?History ? ?Chief Complaint  ?Patient presents with  ? Shortness of Breath  ? ? ?Jennifer [name] is a 56 y.o. female.  Presented to the emergency department concern for shortness of breath.  Patient states that she has dealt with shortness of breath previously, has been told that she has restricted airway problem by her asthma doctor.  Has been taking her Xopenex with intermittent relief.  Has been ongoing for the past couple weeks.  No change in symptoms today but not getting better.  No chest pain or chest tightness.  No fevers or chills.  Some mild nonproductive cough. ? ?HPI ? ?  ? ?Home Medications ?Prior to Admission medications   ?Medication Sig Start Date End Date Taking? Authorizing Provider  ?ACCU-CHEK GUIDE test strip For testing blood sugars dailyDx:r73.03 06/17/21   [name], MD  ?amLODipine (NORVASC) 2.5 MG tablet Take 1 tablet (2.5 mg total) by mouth daily. 10/14/21   [name], MD  ?dexamethasone (DECADRON) 2 MG tablet Take 2 mg by mouth daily. ?Patient not taking: Reported on 11/10/2021 11/04/21   [provider]  ?dicyclomine (BENTYL) 20 MG tablet Take 1 tablet (20 mg total) by mouth 2 (two) times daily as needed for spasms. 10/26/21   Gareth Morgan, MD  ?EPINEPHrine 0.3 mg/0.3 mL IJ SOAJ injection SMARTSIG:0.3 Milligram(s) IM Once 05/18/21   [provider]  ?EPINEPHrine 0.3 mg/0.3 mL IJ SOAJ injection Use as directed for severe allergic reactions 11/10/21   Althea Charon, [place]  ?famotidine (PEPCID) 20 MG tablet Take by mouth. 05/06/21   [provider]  ?flunisolide (NASALIDE) 25 MCG/ACT (0.025%) SOLN Place into the nose. 05/27/21   [provider]  ?levalbuterol Penne Lash HFA) 45 MCG/ACT inhaler INHALE 2 PUFFS INTO THE LUNGS EVERY 6 HOURS AS NEEDED FOR WHEEZING 11/10/21   Althea Charon, FNP  ?levalbuterol (XOPENEX) 1.25  MG/3ML nebulizer solution Take 1.25 mg by nebulization every 3 (three) hours as needed for wheezing. 09/28/20   Valentina Shaggy, MD  ?metoprolol tartrate (LOPRESSOR) 25 MG tablet Take 1 tablet (25 mg total) by mouth in the morning, at noon, and at bedtime. 09/08/21   [name], MD  ?metoprolol tartrate (LOPRESSOR) 50 MG tablet Take 1 tablet (50 mg total) by mouth 2 (two) times daily. 09/30/21   [name], MD  ?ondansetron (ZOFRAN-ODT) 4 MG disintegrating tablet Take 1 tablet (4 mg total) by mouth every 8 (eight) hours as needed for nausea or vomiting. 10/26/21   Gareth Morgan, MD  ?potassium chloride (KLOR-CON) 20 MEQ packet Take 20 mEq by mouth daily. 09/09/21   [name], MD  ?sucralfate (CARAFATE) 1 GM/10ML suspension Take 10 mLs (1 g total) by mouth 4 (four) times daily -  with meals and at bedtime. 11/03/21   [name], MD  ?Tiotropium Bromide Monohydrate (SPIRIVA RESPIMAT) 1.25 MCG/ACT AERS Inhale 2 puffs into the lungs daily. 11/10/21   Althea Charon, [place]  ?   ? ?Allergies    ?Azithromycin, Ciprofloxacin, Codeine, Erythromycin base, Sulfa antibiotics, Sulfasalazine, Telmisartan, Ace inhibitors, Aspirin, Atenolol, Avelox [moxifloxacin hcl in nacl], Beta adrenergic blockers, Buspar [buspirone], Butorphanol tartrate, Cetirizine, Clonidine hcl, Cortisone, Erythromycin, Fentanyl, Fluoxetine hcl, Ketorolac tromethamine, Lidocaine, Lisinopril, Metoclopramide hcl, Midazolam, Montelukast, Montelukast sodium, Naproxen, Paroxetine, Penicillins, Pravastatin, Promethazine, Promethazine hcl, Quinolones, Serotonin reuptake inhibitors (ssris), Sertraline hcl, Stelazine [trifluoperazine], Tobramycin, Trifluoperazine hcl, Atrovent nasal  spray [ipratropium], Diltiazem, Polyethylene glycol 3350, Propoxyphene, Adhesive [tape], Butorphanol, Ceftriaxone, Iron, Metoclopramide, Metronidazole, Other, Prednisone, Prochlorperazine, Venlafaxine, and Zyrtec [cetirizine hcl]   ? ?Review  of Systems   ?Review of Systems  ?Constitutional:  Negative for chills and fever.  ?HENT:  Negative for ear pain and sore throat.   ?Eyes:  Negative for pain and visual disturbance.  ?Respiratory:  Positive for cough and shortness of breath.   ?Cardiovascular:  Negative for chest pain and palpitations.  ?Gastrointestinal:  Negative for abdominal pain and vomiting.  ?Genitourinary:  Negative for dysuria and hematuria.  ?Musculoskeletal:  Negative for arthralgias and back pain.  ?Skin:  Negative for color change and rash.  ?Neurological:  Negative for seizures and syncope.  ?All other systems reviewed and are negative. ? ?Physical Exam ?Updated Vital Signs ?BP (!) 147/91   Pulse (!) 102   Temp 99.1 ?F (37.3 ?C) (Oral)   Resp (!) 22   Ht '5\' 2"'$  (1.575 m)   Wt 104.3 kg   LMP 06/25/2013   SpO2 98%   BMI 42.07 kg/m?  ?Physical Exam ?Vitals and nursing note reviewed.  ?Constitutional:   ?   General: She is not in acute distress. ?   Appearance: She is well-developed.  ?HENT:  ?   Head: Normocephalic and atraumatic.  ?Eyes:  ?   Conjunctiva/sclera: Conjunctivae normal.  ?Cardiovascular:  ?   Rate and Rhythm: Normal rate and regular rhythm.  ?   Heart sounds: No murmur heard. ?Pulmonary:  ?   Effort: Pulmonary effort is normal. No respiratory distress.  ?   Breath sounds: Normal breath sounds.  ?Abdominal:  ?   Palpations: Abdomen is soft.  ?   Tenderness: There is no abdominal tenderness.  ?Musculoskeletal:     ?   General: No swelling.  ?   Cervical back: Neck supple.  ?Skin: ?   General: Skin is warm and dry.  ?   Capillary Refill: Capillary refill takes less than 2 seconds.  ?Neurological:  ?   Mental Status: She is alert.  ?Psychiatric:     ?   Mood and Affect: Mood normal.  ? ? ?ED Results / Procedures / Treatments   ?Labs ?(all labs ordered are listed, but only abnormal results are displayed) ?Labs Reviewed  ?BASIC METABOLIC PANEL - Abnormal; Notable for the following components:  ?    Result Value  ? Glucose,  Bld 114 (*)   ? All other components within normal limits  ?CBC WITH DIFFERENTIAL/PLATELET  ?D-DIMER, QUANTITATIVE  ?TROPONIN I (HIGH SENSITIVITY)  ? ? ?EKG ?None ? ?Radiology ?DG Chest 2 View ? ?Result Date: 11/15/2021 ?CLINICAL DATA:  Shortness of breath. EXAM: CHEST - 2 VIEW COMPARISON:  Chest x-ray 01/04/2022 FINDINGS: The heart size and mediastinal contours are within normal limits. Both lungs are clear. There is a healed right sixth rib fracture, unchanged. IMPRESSION: No active cardiopulmonary disease. Electronically Signed   By: Ronney Asters M.D.   On: 11/15/2021 17:43   ? ?Procedures ?Procedures  ? ? ?Medications Ordered in ED ?Medications - No data to display ? ?ED Course/ Medical Decision Making/ A&P ?  ?                        ?Medical Decision Making ?Amount and/or Complexity of Data Reviewed ?Labs: ordered. ?Radiology: ordered. ? ? ?56 year old lady presenting to the emergency room breath.  On physical exam she is well-appearing in no distress.  Initial triage vitals  noted for slight tachycardia.  Out of abundance of precaution, checked labs, CXR to rule out pneumonia, PE, ACS.  EKG without ischemic change, troponin within normal limits, doubt ACS.  D-dimer within normal limits, doubt PE.  CXR negative for pneumonia.  On reassessment she remains well-appearing in no distress.  Advise follow-up with her primary care doctor, feel patient stable for discharge at this time. ? ?Completed extensive chart review, including outside record review [facility].  Patient has had frequent ER visits for similar symptoms over the past month with [facility] as well as in the [facility].  Was given steroids at last [facility] visit. ? ? ? ?After the discussed management above, the patient was determined to be safe for discharge.  The patient was in agreement with this plan and all questions regarding their care were answered.  ED return precautions were discussed and the patient will return to the ED  with any significant worsening of condition. ? ? ? ? ? ? ? ? ?Final Clinical Impression(s) / ED Diagnoses ?Final diagnoses:  ?Dyspnea, unspecified type  ? ? ?Rx / DC Orders ?ED Discharge Orders   ? ? None  ? ?

## 2021-11-15 NOTE — ED Triage Notes (Signed)
Pt arrives pov, steady  gait with c/o shob. Reports treatment by "asthma dr" and was told I have restricted airway". Pt speaking in complete sentences. Reports inhaler not relieving symptoms ?

## 2021-11-16 ENCOUNTER — Encounter: Payer: Self-pay | Admitting: Family Medicine

## 2021-11-17 ENCOUNTER — Encounter: Payer: Self-pay | Admitting: Family Medicine

## 2021-11-17 ENCOUNTER — Ambulatory Visit (INDEPENDENT_AMBULATORY_CARE_PROVIDER_SITE_OTHER): Payer: Medicare HMO | Admitting: Family Medicine

## 2021-11-17 VITALS — BP 162/98 | HR 88 | Temp 98.2°F | Resp 16

## 2021-11-17 DIAGNOSIS — J4540 Moderate persistent asthma, uncomplicated: Secondary | ICD-10-CM | POA: Diagnosis not present

## 2021-11-17 DIAGNOSIS — K219 Gastro-esophageal reflux disease without esophagitis: Secondary | ICD-10-CM

## 2021-11-17 DIAGNOSIS — I10 Essential (primary) hypertension: Secondary | ICD-10-CM | POA: Diagnosis not present

## 2021-11-17 DIAGNOSIS — R000 Tachycardia, unspecified: Secondary | ICD-10-CM | POA: Diagnosis not present

## 2021-11-17 DIAGNOSIS — R0602 Shortness of breath: Secondary | ICD-10-CM | POA: Diagnosis not present

## 2021-11-17 DIAGNOSIS — J310 Chronic rhinitis: Secondary | ICD-10-CM

## 2021-11-17 DIAGNOSIS — R61 Generalized hyperhidrosis: Secondary | ICD-10-CM | POA: Diagnosis not present

## 2021-11-17 DIAGNOSIS — R42 Dizziness and giddiness: Secondary | ICD-10-CM | POA: Diagnosis not present

## 2021-11-17 NOTE — Patient Instructions (Addendum)
Moderate persistent asthma  ?Begin Spiriva Respimat 1.25 mcg 1 puff twice a day to prevent cough or wheeze ?Continue Xopenex 2 puffs every 6 hours as needed for cough, wheeze,tightness in chest, or shortness of breath OR Xopenex 1.25 mg 1 unit dose via nebulizer every 4-6 hours as needed ?Continue to use aerobika valve 1-2 times a day for 5-10 minutes  ? ?Reflux ?Continue dietary lifestyle modifications as listed below ?Continue the regimen as prescribed by your gastroenterologist. ? ?Chronic rhinitis ?Continue to perform nasal saline rinses 1-3 times a day as needed to help clean sinus tract ? ?Call the clinic if this treatment plan is not working well for you ? ?Follow up in 2 weeks or sooner if needed. ? ?

## 2021-11-17 NOTE — Progress Notes (Signed)
? ?[place] [id number] ?Dept: [phone] ? ?FOLLOW UP NOTE ? ?Patient ID: Jennifer [name], female    DOB: 09/09/1965  Age: 56 y.o. MRN: [id number] ?Date of Office Visit: 11/17/2021 ? ?Assessment  ?Chief Complaint: Shortness of Breath (More when she is working) ? ?HPI ?Jennifer [name] is a 56 year old female who presents the clinic for follow-up visit.  She was last seen in this clinic 11/10/2021 by Jennifer [name], for evaluation of not well controlled asthma, chronic rhinitis, reflux, and rash.  She is accompanied by her friend who assists with history.  At today's visit, she reports her asthma has been not well controlled with symptoms including shortness of breath with activity and occasional wheeze.  She does report some chest tightness.  She continues Xopenex daily with moderate relief of symptoms.  She has not yet tried Spiriva as she is anxious about possible side effects.  She does report that she has visited the emergency department on 11/15/2021 for shortness of breath and received a steroid taper which she has not started at this time.  Chest x-ray from emergency room visit on 11/15/2021 was normal.  Chronic rhinitis is reported as moderately well controlled with symptoms including occasional nasal congestion and postnasal drainage.  She occasionally takes Claritin and infrequently uses a nasal steroid spray.  She is not currently using a nasal saline rinse.  Reflux is reported as moderately well controlled with famotidine once a day.  Her current medications are listed in the chart. ?Of note, she is willing to begin using Spiriva 1 puff twice a day and agrees to take the first puff in the clinic in order to monitor for adverse reaction. ? ?Chest x-ray 11/15/2021 results listed below ?EXAM:  ?CHEST - 2 VIEW  ? ?COMPARISON:  Chest x-ray 01/04/2022  ? ?FINDINGS:  ?The heart size and mediastinal contours are within normal limits.  ?Both lungs are clear. There is a healed right sixth rib  fracture,  ?unchanged.  ? ?IMPRESSION:  ?No active cardiopulmonary disease.  ? ?Drug Allergies:  ?Allergies  ?Allergen Reactions  ? Azithromycin Shortness Of Breath  ?  Lip swelling, SOB.  ?   ? Ciprofloxacin Swelling  ?  REACTION: tongue swells  ? Codeine Shortness Of Breath  ? Erythromycin Base Itching and Rash  ? Sulfa Antibiotics Shortness Of Breath, Rash and Other (See Comments)  ? Sulfasalazine Rash and Shortness Of Breath  ?  Other reaction(s): Other (See Comments) ?Other reaction(s): SHORTNESS OF BREATH  ? Telmisartan Swelling  ?  Tongue swelling, Micardis  ? Ace Inhibitors Cough  ? Aspirin Hives and Other (See Comments)  ?  flushing  ? Atenolol Other (See Comments)  ?  Squeezing chest sensation  ? Avelox [Moxifloxacin Hcl In Nacl] Itching  ?     ? Beta Adrenergic Blockers Other (See Comments)  ?  Feels like chest tightening labetalol, bystolic  ?Feels like chest tightening "Metoprolol" ?  ? Buspar [Buspirone] Other (See Comments)  ?  Light headed  ? Butorphanol Tartrate Other (See Comments)  ?  Patient aggitated  ? Cetirizine Hives and Rash  ?     ? Clonidine Hcl   ?  REACTION: makes blood pressure high  ? Cortisone   ?  Feels like she is going crazy  ? Erythromycin Rash  ? Fentanyl Other (See Comments)  ?  aggressive   ? Fluoxetine Hcl Other (See Comments)  ?  REACTION: headaches  ? Ketorolac Tromethamine   ?  jittery  ? Lidocaine Other (See Comments)  ?  When it involves the throat,   ? Lisinopril Cough  ? Metoclopramide Hcl Other (See Comments)  ?  Dystonic reaction  ? Midazolam Other (See Comments)  ?  agitation ?Slow to wake up  ? Montelukast Other (See Comments)  ?  Singulair  ? Montelukast Sodium Other (See Comments)  ?  DOES NOT REMEMBER ? ?Don't remember-told not to take  ? Naproxen Other (See Comments)  ?  FLUSHING ?Pt states she took Ibuprofen today (10/08/19)  ? Paroxetine Other (See Comments)  ?  REACTION: headaches  ? Penicillins Rash  ? Pravastatin Other (See Comments)  ?  Myalgias  ?  Promethazine Other (See Comments)  ?  Dystonic reaction  ? Promethazine Hcl Other (See Comments)  ?  jittery  ? Quinolones Swelling and Rash  ? Serotonin Reuptake Inhibitors (Ssris) Other (See Comments)  ?  Headache ?Effexor, prozac, zoloft,   ? Sertraline Hcl   ?  REACTION: headaches  ? Stelazine [Trifluoperazine] Other (See Comments)  ?  Dystonic reaction  ? Tobramycin Itching and Rash  ? Trifluoperazine Hcl   ?  dystonic  ? Atrovent Nasal Spray [Ipratropium]   ?  Tachycardia and shaking  ? Diltiazem Other (See Comments)  ?  Chest pain  ? Polyethylene Glycol 3350   ?  Other reaction(s): Laryngeal Edema (ALLERGY)  ? Propoxyphene   ? Adhesive [Tape] Rash  ?  EKG monitor patches, some tapes ?Blisters, rash, itching, welts.  ? Butorphanol Anxiety  ?  Patient agitated  ? Ceftriaxone Rash  ?  rocephin  ? Iron Rash  ?  Flushing with certain IV types  ? Metoclopramide Itching and Other (See Comments)  ?  Dystonic reaction  ? Metronidazole Rash  ? Other Rash and Other (See Comments)  ?  Uncoded Allergy. Allergen: steriods, Other Reaction: Not Assessed ?Other reaction(s): Flushing (ALLERGY/intolerance), GI Upset (intolerance), Hypertension (intolerance), Increased Heart Rate (intolerance), Mental Status Changes (intolerance), Other (See Comments), Tachycardia / Palpitations  (intolerance) ?Hospital gowns leave a rash. ?  ? Prednisone Anxiety and Palpitations  ? Prochlorperazine Anxiety  ?  Compazine:  Dystonic reaction  ? Venlafaxine Anxiety  ? Zyrtec [Cetirizine Hcl] Rash  ?  All over body  ? ? ?Physical Exam: ?BP (!) 162/98 (BP Location: Left Arm, Patient Position: Sitting, Cuff Size: Large)   Pulse 88   Temp 98.2 ?F (36.8 ?C) (Temporal)   Resp 16   LMP 06/25/2013   SpO2 97%   ? ?Physical Exam ?Vitals reviewed.  ?Constitutional:   ?   Appearance: She is well-developed.  ?HENT:  ?   Head: Normocephalic and atraumatic.  ?   Right Ear: Tympanic membrane normal.  ?   Left Ear: Tympanic membrane normal.  ?   Nose:  ?    Comments: Bilateral nares slightly erythematous with clear nasal drainage noted.  Pharynx normal.  Ears normal.  Eyes normal. ?   Mouth/Throat:  ?   Pharynx: Oropharynx is clear.  ?Eyes:  ?   Conjunctiva/sclera: Conjunctivae normal.  ?Cardiovascular:  ?   Rate and Rhythm: Normal rate and regular rhythm.  ?   Heart sounds: Normal heart sounds. No murmur heard. ?Pulmonary:  ?   Effort: Pulmonary effort is normal.  ?   Breath sounds: Normal breath sounds.  ?   Comments: Lungs clear to auscultation ?Musculoskeletal:     ?   General: Normal range of motion.  ?   Cervical back: Normal  range of motion and neck supple.  ?Skin: ?   General: Skin is warm and dry.  ?Neurological:  ?   Mental Status: She is alert and oriented to person, place, and time.  ?Psychiatric:     ?   Mood and Affect: Mood normal.     ?   Behavior: Behavior normal.     ?   Thought Content: Thought content normal.     ?   Judgment: Judgment normal.  ? ? ?Diagnostics: ?FVC 2.20, FEV1 1.88.  Predicted FVC 3.05, predicted FEV1 2.43.  Spirometry indicates possible restriction.  This is consistent with previous spirometry readings. ? ?Assessment and Plan: ?1. Not well controlled moderate persistent asthma   ?2. Chronic rhinitis   ?3. Gastroesophageal reflux disease, unspecified whether esophagitis present   ? ? ?Patient Instructions  ?Moderate persistent asthma  ?Begin Spiriva Respimat 1.25 mcg 1 puff twice a day to prevent cough or wheeze ?Continue Xopenex 2 puffs every 6 hours as needed for cough, wheeze,tightness in chest, or shortness of breath OR Xopenex 1.25 mg 1 unit dose via nebulizer every 4-6 hours as needed ?Continue to use aerobika valve 1-2 times a day for 5-10 minutes  ? ?Reflux ?Continue dietary lifestyle modifications as listed below ?Continue the regimen as prescribed by your gastroenterologist. ? ?Chronic rhinitis ?Continue to perform nasal saline rinses 1-3 times a day as needed to help clean sinus tract ? ?Call the clinic if this treatment  plan is not working well for you ? ?Follow up in 2 weeks or sooner if needed. ? ? ?Return in about 2 weeks (around 12/01/2021), or if symptoms worsen or fail to improve. ?  ? ?Thank you for the opportunity to care for this

## 2021-11-18 DIAGNOSIS — R079 Chest pain, unspecified: Secondary | ICD-10-CM | POA: Diagnosis not present

## 2021-11-21 ENCOUNTER — Telehealth: Payer: Self-pay | Admitting: Family Medicine

## 2021-11-21 NOTE — Telephone Encounter (Signed)
Patient called and said that has had reaction with the inhaler and had to go to er. Need to know what to do about asthma. [phone] ?

## 2021-11-21 NOTE — Telephone Encounter (Signed)
Please have her stop spiriva at this time. Have her continue with levalbuterol once every 6 hours as needed and have her make a follow up appointment with the clinic. Thank you

## 2021-11-22 DIAGNOSIS — R0981 Nasal congestion: Secondary | ICD-10-CM | POA: Diagnosis not present

## 2021-11-22 DIAGNOSIS — Z20822 Contact with and (suspected) exposure to covid-19: Secondary | ICD-10-CM | POA: Diagnosis not present

## 2021-11-22 DIAGNOSIS — R059 Cough, unspecified: Secondary | ICD-10-CM | POA: Diagnosis not present

## 2021-11-22 DIAGNOSIS — Z87891 Personal history of nicotine dependence: Secondary | ICD-10-CM | POA: Diagnosis not present

## 2021-11-22 DIAGNOSIS — I10 Essential (primary) hypertension: Secondary | ICD-10-CM | POA: Diagnosis not present

## 2021-11-22 DIAGNOSIS — R0602 Shortness of breath: Secondary | ICD-10-CM | POA: Diagnosis not present

## 2021-11-23 ENCOUNTER — Other Ambulatory Visit: Payer: Self-pay

## 2021-11-23 DIAGNOSIS — J4541 Moderate persistent asthma with (acute) exacerbation: Secondary | ICD-10-CM

## 2021-11-24 ENCOUNTER — Ambulatory Visit (INDEPENDENT_AMBULATORY_CARE_PROVIDER_SITE_OTHER): Payer: Medicare HMO | Admitting: Family Medicine

## 2021-11-24 ENCOUNTER — Encounter: Payer: Self-pay | Admitting: Family Medicine

## 2021-11-24 VITALS — BP 151/77 | HR 96 | Ht 62.0 in | Wt 229.9 lb

## 2021-11-24 DIAGNOSIS — I10 Essential (primary) hypertension: Secondary | ICD-10-CM | POA: Diagnosis not present

## 2021-11-24 DIAGNOSIS — D510 Vitamin B12 deficiency anemia due to intrinsic factor deficiency: Secondary | ICD-10-CM

## 2021-11-24 DIAGNOSIS — F339 Major depressive disorder, recurrent, unspecified: Secondary | ICD-10-CM | POA: Diagnosis not present

## 2021-11-24 MED ORDER — LISINOPRIL 5 MG PO TABS: 5.0000 mg | ORAL_TABLET | Freq: Every day | ORAL | 1 refills | Status: DC

## 2021-11-24 MED ORDER — CYANOCOBALAMIN 1000 MCG/ML IJ SOLN
1000.0000 ug | Freq: Once | INTRAMUSCULAR | Status: AC
  Administered 2021-11-24: 1000 ug via INTRAMUSCULAR

## 2021-11-24 MED ORDER — METOPROLOL TARTRATE 50 MG PO TABS: 50.0000 mg | ORAL_TABLET | Freq: Two times a day (BID) | ORAL | 1 refills | Status: DC
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
## 2021-11-24 NOTE — Assessment & Plan Note (Signed)
B12 injection given today last 1 was about 5 and half weeks ago. ?

## 2021-11-24 NOTE — Assessment & Plan Note (Addendum)
Uncontrolled.  She is willing to retry lisinopril which she took in the past and she did well except for developing a dry cough.  But otherwise tolerated it well.  She is okay with starting 5 mg.  We just discussed that if the cough returns to give it a week or 2 just to make sure its not postnasal drip drainage or allergies and if it still persist then we will stop the medication just reminded her that the cough is not an allergic reaction it is just a side effect and so it is very easy to discontinue medication if needed.  Continue metoprolol.  Refill sent to pharmacy. ?

## 2021-11-24 NOTE — Assessment & Plan Note (Signed)
He has already made her appointment will work on a send a referral just so that we can make sure it is covered by her insurance.  I think this could really be helpful for her and dealing with a lot of things and stressors going on in her life as well as some of her chronic medical problems. ?

## 2021-11-25 ENCOUNTER — Ambulatory Visit: Payer: Medicare HMO | Admitting: Family Medicine

## 2021-11-25 DIAGNOSIS — I10 Essential (primary) hypertension: Secondary | ICD-10-CM | POA: Diagnosis not present

## 2021-11-25 DIAGNOSIS — Z95818 Presence of other cardiac implants and grafts: Secondary | ICD-10-CM | POA: Diagnosis not present

## 2021-11-25 DIAGNOSIS — I459 Conduction disorder, unspecified: Secondary | ICD-10-CM | POA: Diagnosis not present

## 2021-11-25 DIAGNOSIS — E611 Iron deficiency: Secondary | ICD-10-CM | POA: Diagnosis not present

## 2021-11-25 DIAGNOSIS — R002 Palpitations: Secondary | ICD-10-CM | POA: Diagnosis not present

## 2021-11-25 DIAGNOSIS — R1310 Dysphagia, unspecified: Secondary | ICD-10-CM | POA: Diagnosis not present

## 2021-11-25 DIAGNOSIS — K5792 Diverticulitis of intestine, part unspecified, without perforation or abscess without bleeding: Secondary | ICD-10-CM | POA: Diagnosis not present

## 2021-11-25 DIAGNOSIS — Z6841 Body Mass Index (BMI) 40.0 and over, adult: Secondary | ICD-10-CM | POA: Diagnosis not present

## 2021-11-28 ENCOUNTER — Emergency Department (HOSPITAL_BASED_OUTPATIENT_CLINIC_OR_DEPARTMENT_OTHER): Payer: Medicare HMO

## 2021-11-28 ENCOUNTER — Encounter (HOSPITAL_BASED_OUTPATIENT_CLINIC_OR_DEPARTMENT_OTHER): Payer: Self-pay

## 2021-11-28 ENCOUNTER — Other Ambulatory Visit: Payer: Self-pay

## 2021-11-28 ENCOUNTER — Emergency Department (HOSPITAL_BASED_OUTPATIENT_CLINIC_OR_DEPARTMENT_OTHER)
Admission: EM | Admit: 2021-11-28 | Discharge: 2021-11-28 | Disposition: A | Discharge status: Home / Self Care | Payer: Medicare HMO | Attending: Emergency Medicine | Admitting: Emergency Medicine

## 2021-11-28 DIAGNOSIS — J45909 Unspecified asthma, uncomplicated: Secondary | ICD-10-CM | POA: Diagnosis not present

## 2021-11-28 DIAGNOSIS — I10 Essential (primary) hypertension: Secondary | ICD-10-CM | POA: Insufficient documentation

## 2021-11-28 DIAGNOSIS — R0789 Other chest pain: Secondary | ICD-10-CM

## 2021-11-28 DIAGNOSIS — R079 Chest pain, unspecified: Secondary | ICD-10-CM | POA: Diagnosis not present

## 2021-11-28 DIAGNOSIS — Z7951 Long term (current) use of inhaled steroids: Secondary | ICD-10-CM | POA: Diagnosis not present

## 2021-11-28 DIAGNOSIS — Z79899 Other long term (current) drug therapy: Secondary | ICD-10-CM | POA: Insufficient documentation

## 2021-11-28 NOTE — ED Triage Notes (Signed)
Pt arrives ambulatory to ED with c/o pain in right chest under right breast for several days. States she does have upcoming appointment with her doctor this week. Pt speaking in full sentences in triage, NAD.  ?

## 2021-11-28 NOTE — Discharge Instructions (Signed)
I would recommend a dose of dexamethasone or Tylenol and ibuprofen.  Follow-up with your primary care doctor.  Ultimately I think that this is a muscular process.   ?

## 2021-11-28 NOTE — ED Provider Notes (Signed)
?[facility] EMERGENCY DEPARTMENT ?Provider Note ? ? ?CSN: [id number] ?Arrival date & time: 11/28/21  1226 ? ?  ? ?History ? ?Chief Complaint  ?Patient presents with  ? Chest Pain  ? ? ?Jennifer [name] is a 56 y.o. female. ? ?The history is provided by the patient.  ?Chest Pain ?Pain location:  R chest ?Pain quality: aching   ?Pain radiates to:  Does not radiate ?Pain severity:  Mild ?Onset quality:  Gradual ?Timing:  Intermittent ?Progression:  Waxing and waning ?Chronicity:  Recurrent ?Context: at rest   ?Relieved by:  Nothing ?Worsened by:  Nothing ?Associated symptoms: no abdominal pain, no anxiety, no back pain, no claudication, no cough, no diaphoresis, no dizziness, no dysphagia, no fatigue, no fever, no headache, no heartburn, no lower extremity edema, no nausea, no near-syncope, no numbness, no orthopnea, no palpitations, no PND, no shortness of breath, no syncope and no weakness   ?Risk factors: no high cholesterol and no hypertension   ?Risk factors comment:  ASTHAM ? ?  ? ?Home Medications ?Prior to Admission medications   ?Medication Sig Start Date End Date Taking? Authorizing Provider  ?ACCU-CHEK GUIDE test strip For testing blood sugars dailyDx:r73.03 06/17/21   [name], MD  ?dicyclomine (BENTYL) 20 MG tablet Take 1 tablet (20 mg total) by mouth 2 (two) times daily as needed for spasms. 10/26/21   Gareth Morgan, MD  ?EPINEPHrine 0.3 mg/0.3 mL IJ SOAJ injection Use as directed for severe allergic reactions 11/10/21   Althea Charon, FNP  ?famotidine (PEPCID) 20 MG tablet Take by mouth. 05/06/21   [provider]  ?flunisolide (NASALIDE) 25 MCG/ACT (0.025%) SOLN Place into the nose. 05/27/21   [provider]  ?levalbuterol Penne Lash HFA) 45 MCG/ACT inhaler INHALE 2 PUFFS INTO THE LUNGS EVERY 6 HOURS AS NEEDED FOR WHEEZING 11/10/21   Althea Charon, FNP  ?levalbuterol (XOPENEX) 1.25 MG/3ML nebulizer solution Take 1.25 mg by nebulization every 3 (three) hours as  needed for wheezing. 09/28/20   Valentina Shaggy, MD  ?lisinopril (ZESTRIL) 5 MG tablet Take 1 tablet (5 mg total) by mouth daily. 11/24/21   [name], MD  ?metoprolol tartrate (LOPRESSOR) 50 MG tablet Take 1 tablet (50 mg total) by mouth 2 (two) times daily. 11/24/21   [name], MD  ?potassium chloride (KLOR-CON) 20 MEQ packet Take 20 mEq by mouth daily. 09/09/21   [name], MD  ?sucralfate (CARAFATE) 1 GM/10ML suspension Take 10 mLs (1 g total) by mouth 4 (four) times daily -  with meals and at bedtime. 11/03/21   [name], MD  ?   ? ?Allergies    ?Azithromycin, Ciprofloxacin, Codeine, Erythromycin base, Sulfa antibiotics, Sulfasalazine, Telmisartan, Ace inhibitors, Aspirin, Atenolol, Avelox [moxifloxacin hcl in nacl], Beta adrenergic blockers, Buspar [buspirone], Butorphanol tartrate, Cetirizine, Clonidine hcl, Cortisone, Erythromycin, Fentanyl, Fluoxetine hcl, Ketorolac tromethamine, Lidocaine, Lisinopril, Metoclopramide hcl, Midazolam, Montelukast, Montelukast sodium, Naproxen, Paroxetine, Penicillins, Pravastatin, Promethazine, Promethazine hcl, Quinolones, Serotonin reuptake inhibitors (ssris), Sertraline hcl, Stelazine [trifluoperazine], Tobramycin, Trifluoperazine hcl, Atrovent nasal spray [ipratropium], Diltiazem, Polyethylene glycol 3350, Propoxyphene, Adhesive [tape], Butorphanol, Ceftriaxone, Iron, Metoclopramide, Metronidazole, Other, Prednisone, Prochlorperazine, Venlafaxine, and Zyrtec [cetirizine hcl]   ? ?Review of Systems   ?Review of Systems  ?Constitutional:  Negative for diaphoresis, fatigue and fever.  ?HENT:  Negative for trouble swallowing.   ?Respiratory:  Negative for cough and shortness of breath.   ?Cardiovascular:  Positive for chest pain. Negative for palpitations, orthopnea, claudication, syncope, PND and near-syncope.  ?Gastrointestinal:  Negative for abdominal pain, heartburn and nausea.  ?Musculoskeletal:  Negative for back pain.   ?Neurological:  Negative for dizziness, weakness, numbness and headaches.  ? ?Physical Exam ?Updated Vital Signs ?BP (!) 160/94 (BP Location: Left Arm)   Pulse 88   Temp 98.1 ?F (36.7 ?C) (Oral)   Resp 18   Ht '5\' 2"'$  (1.575 m)   Wt 104.3 kg   LMP 06/25/2013   SpO2 95%   BMI 42.05 kg/m?  ?Physical Exam ?Vitals and nursing note reviewed.  ?Constitutional:   ?   General: She is not in acute distress. ?   Appearance: She is well-developed.  ?HENT:  ?   Head: Normocephalic and atraumatic.  ?Eyes:  ?   Conjunctiva/sclera: Conjunctivae normal.  ?Cardiovascular:  ?   Rate and Rhythm: Normal rate and regular rhythm.  ?   Heart sounds: No murmur heard. ?Pulmonary:  ?   Effort: Pulmonary effort is normal. No respiratory distress.  ?   Breath sounds: Normal breath sounds.  ?Chest:  ?   Chest wall: Tenderness present.  ?Abdominal:  ?   Palpations: Abdomen is soft.  ?   Tenderness: There is no abdominal tenderness.  ?Musculoskeletal:     ?   General: No swelling.  ?   Cervical back: Neck supple.  ?Skin: ?   General: Skin is warm and dry.  ?   Capillary Refill: Capillary refill takes less than 2 seconds.  ?Neurological:  ?   Mental Status: She is alert.  ?Psychiatric:     ?   Mood and Affect: Mood normal.  ? ? ?ED Results / Procedures / Treatments   ?Labs ?(all labs ordered are listed, but only abnormal results are displayed) ?Labs Reviewed - No data to display ? ?EKG ?EKG Interpretation ? ?Date/Time:  Monday November 28 2021 12:49:46 EDT ?Ventricular Rate:  99 ?PR Interval:  120 ?QRS Duration: 84 ?QT Interval:  330 ?QTC Calculation: 423 ?R Axis:   25 ?Text Interpretation: Normal sinus rhythm Normal ECG When compared with ECG of 15-Nov-2021 17:49, PREVIOUS ECG IS PRESENT Confirmed by Lennice Sites [phone]) on 11/28/2021 12:58:01 PM ? ?Radiology ?DG Chest Portable 1 View ? ?Result Date: 11/28/2021 ?CLINICAL DATA:  Chest pain EXAM: PORTABLE CHEST 1 VIEW COMPARISON:  Chest x-ray dated November 17, 2021 FINDINGS: The heart size and  mediastinal contours are within normal limits. Both lungs are clear. The visualized skeletal structures are unremarkable. IMPRESSION: No active disease. Electronically Signed   By: Yetta Glassman M.D.   On: 11/28/2021 13:01   ? ?Procedures ?Procedures  ? ? ?Medications Ordered in ED ?Medications - No data to display ? ?ED Course/ Medical Decision Making/ A&P ?  ?                        ?Medical Decision Making ?Amount and/or Complexity of Data Reviewed ?Radiology: ordered. ? ? ?DYNISHA DUE is here with chest pain.  Patient with history of asthma, high cholesterol, hypertension.  Here with focal right-sided chest wall pain.  On and off for the last several days.  Comes and goes.  States that it feels like it is underneath her right side of her rib.  Chest x-ray was done per my review interpretation shows no evidence of pneumonia or pneumothorax.  EKG done per my review and interpretation shows sinus rhythm.  No ischemic changes.  No wheezing on exam.  Fairly good air movement.  Patient with multiple visits for similar type  complaints.  She has had fairly recent negative cardiac work-up and pulmonary work-up.  I have no concern for blood clot or ACS.  Very atypical story for both of these things.  She has reproducible chest wall pain and very focal pain.  At this time I recommend Tylenol and ibuprofen.  Consider taking home dose of dexamethasone.  She has follow-up with primary care doctor later this week.  Understands return precautions and discharged in the ED in good condition. ? ?This chart was dictated using voice recognition software.  Despite best efforts to proofread,  errors can occur which can change the documentation meaning.  ? ? ? ? ? ? ? ?Final Clinical Impression(s) / ED Diagnoses ?Final diagnoses:  ?Atypical chest pain  ? ? ?Rx / DC Orders ?ED Discharge Orders   ? ? None  ? ?  ? ? ?  ?Lennice Sites, DO ?11/28/21 1358 ? ?

## 2021-12-01 ENCOUNTER — Encounter: Payer: Self-pay | Admitting: Family Medicine

## 2021-12-01 ENCOUNTER — Ambulatory Visit (INDEPENDENT_AMBULATORY_CARE_PROVIDER_SITE_OTHER): Payer: Medicare HMO | Admitting: Family Medicine

## 2021-12-01 VITALS — BP 148/96 | HR 100 | Temp 97.9°F | Resp 18

## 2021-12-01 DIAGNOSIS — J310 Chronic rhinitis: Secondary | ICD-10-CM | POA: Diagnosis not present

## 2021-12-01 DIAGNOSIS — R1084 Generalized abdominal pain: Secondary | ICD-10-CM | POA: Diagnosis not present

## 2021-12-01 DIAGNOSIS — J4540 Moderate persistent asthma, uncomplicated: Secondary | ICD-10-CM | POA: Diagnosis not present

## 2021-12-01 DIAGNOSIS — K219 Gastro-esophageal reflux disease without esophagitis: Secondary | ICD-10-CM | POA: Diagnosis not present

## 2021-12-01 DIAGNOSIS — R6884 Jaw pain: Secondary | ICD-10-CM | POA: Diagnosis not present

## 2021-12-01 DIAGNOSIS — R197 Diarrhea, unspecified: Secondary | ICD-10-CM | POA: Diagnosis not present

## 2021-12-01 DIAGNOSIS — R030 Elevated blood-pressure reading, without diagnosis of hypertension: Secondary | ICD-10-CM | POA: Diagnosis not present

## 2021-12-01 DIAGNOSIS — R000 Tachycardia, unspecified: Secondary | ICD-10-CM | POA: Diagnosis not present

## 2021-12-01 DIAGNOSIS — I10 Essential (primary) hypertension: Secondary | ICD-10-CM | POA: Diagnosis not present

## 2021-12-01 MED ORDER — NYSTATIN 100000 UNIT/ML MT SUSP: OROMUCOSAL | 0 refills | Status: DC

## 2021-12-01 MED ORDER — ARNUITY ELLIPTA 100 MCG/ACT IN AEPB: INHALATION_SPRAY | RESPIRATORY_TRACT | 3 refills | Status: DC

## 2021-12-01 NOTE — Progress Notes (Signed)
? ?[place] [address] ?Dept: [phone] ? ?FOLLOW UP NOTE ? ?Patient ID: Jennifer [name], female    DOB: 21-Sep-1965  Age: 56 y.o. MRN: [id number] ?Date of Office Visit: 12/01/2021 ? ?Assessment  ?Chief Complaint: Asthma (Pt ) and Shortness of Breath ? ?HPI ?Jennifer [name] is a 56 year old female who presents the clinic for follow-up visit.  She was last seen in this clinic on 11/17/2021 for evaluation of asthma, chronic rhinitis, and reflux.  At that visit, she took 1 puff of Spiriva 1.25 mcg in the clinic and stayed for 1 hour with no adverse reaction.  She reports later that day that she began to experience symptoms including intermittent headache, " inflated" feeling in her lungs, and a" loopy" feeling.  She subsequently went to the emergency department later that same day and was transported to the emergency department by [name] for evaluation of shortness of breath and tachycardia.  At that visit, D-dimer was 390, chest x-ray indicated no acute chest findings, twelve-lead EKG indicated sinus rhythm within normal limits.  Upon completion of her emergency department visit no emergency medical condition had been identified.  She did request a referral to [facility], pulmonary specialists, for further evaluation.  Chart review indicates that she went to emergency department for additional evaluation of shortness of breath, cough, congestion, and chest tightness on 11/22/2021.  At today's visit, she reports she has been experiencing a decrease in her symptoms of asthma with symptoms including occasional shortness of breath and intermittent dry cough.  She reports this cough is mostly coming from chronic irritation in her throat.  She denies weight with rest or activity.  She continues Xopenex once every few days with moderate relief of symptoms and is not currently using a maintenance asthma inhaler.  She reports that she has tried montelukast on a previous occasion with perceived  side effects.  She has tried several inhaled corticosteroid inhalers which invariably lead to thrush.  She reports a possible rash occurring around her mouth while using Arnuity.  She does report Arnuity provided relief from shortness of breath, cough, and wheeze.  Reflux is reported as well controlled with no heartburn or vomiting.  Chronic rhinitis is reported as moderately well controlled with symptoms including nasal congestion, clear rhinorrhea, and postnasal drainage with frequent throat clearing.  She continues to use fluocinonide as needed.  She demonstrates poor application technique with fluocinonide.  She is not currently using a nasal saline rinse.  Her current medications are listed in the chart. ? ? ?Drug Allergies:  ?Allergies  ?Allergen Reactions  ? Azithromycin Shortness Of Breath  ?  Lip swelling, SOB.  ?   ? Ciprofloxacin Swelling  ?  REACTION: tongue swells  ? Codeine Shortness Of Breath  ? Erythromycin Base Itching and Rash  ? Sulfa Antibiotics Shortness Of Breath, Rash and Other (See Comments)  ? Sulfasalazine Rash and Shortness Of Breath  ?  Other reaction(s): Other (See Comments) ?Other reaction(s): SHORTNESS OF BREATH  ? Telmisartan Swelling  ?  Tongue swelling, Micardis  ? Ace Inhibitors Cough  ? Aspirin Hives and Other (See Comments)  ?  flushing  ? Atenolol Other (See Comments)  ?  Squeezing chest sensation  ? Avelox [Moxifloxacin Hcl In Nacl] Itching  ?     ? Beta Adrenergic Blockers Other (See Comments)  ?  Feels like chest tightening labetalol, bystolic  ?Feels like chest tightening "Metoprolol" ?  ? Buspar [Buspirone] Other (See  Comments)  ?  Light headed  ? Butorphanol Tartrate Other (See Comments)  ?  Patient aggitated  ? Cetirizine Hives and Rash  ?     ? Clonidine Hcl   ?  REACTION: makes blood pressure high  ? Cortisone   ?  Feels like she is going crazy  ? Erythromycin Rash  ? Fentanyl Other (See Comments)  ?  aggressive   ? Fluoxetine Hcl Other (See Comments)  ?  REACTION:  headaches  ? Ketorolac Tromethamine   ?  jittery  ? Lidocaine Other (See Comments)  ?  When it involves the throat,   ? Lisinopril Cough  ? Metoclopramide Hcl Other (See Comments)  ?  Dystonic reaction  ? Midazolam Other (See Comments)  ?  agitation ?Slow to wake up  ? Montelukast Other (See Comments)  ?  Singulair  ? Montelukast Sodium Other (See Comments)  ?  DOES NOT REMEMBER ? ?Don't remember-told not to take  ? Naproxen Other (See Comments)  ?  FLUSHING ?Pt states she took Ibuprofen today (10/08/19)  ? Paroxetine Other (See Comments)  ?  REACTION: headaches  ? Penicillins Rash  ? Pravastatin Other (See Comments)  ?  Myalgias  ? Promethazine Other (See Comments)  ?  Dystonic reaction  ? Promethazine Hcl Other (See Comments)  ?  jittery  ? Quinolones Swelling and Rash  ? Serotonin Reuptake Inhibitors (Ssris) Other (See Comments)  ?  Headache ?Effexor, prozac, zoloft,   ? Sertraline Hcl   ?  REACTION: headaches  ? Stelazine [Trifluoperazine] Other (See Comments)  ?  Dystonic reaction  ? Tobramycin Itching and Rash  ? Trifluoperazine Hcl   ?  dystonic  ? Atrovent Nasal Spray [Ipratropium]   ?  Tachycardia and shaking  ? Diltiazem Other (See Comments)  ?  Chest pain  ? Polyethylene Glycol 3350   ?  Other reaction(s): Laryngeal Edema (ALLERGY)  ? Propoxyphene   ? Adhesive [Tape] Rash  ?  EKG monitor patches, some tapes ?Blisters, rash, itching, welts.  ? Butorphanol Anxiety  ?  Patient agitated  ? Ceftriaxone Rash  ?  rocephin  ? Iron Rash  ?  Flushing with certain IV types  ? Metoclopramide Itching and Other (See Comments)  ?  Dystonic reaction  ? Metronidazole Rash  ? Other Rash and Other (See Comments)  ?  Uncoded Allergy. Allergen: steriods, Other Reaction: Not Assessed ?Other reaction(s): Flushing (ALLERGY/intolerance), GI Upset (intolerance), Hypertension (intolerance), Increased Heart Rate (intolerance), Mental Status Changes (intolerance), Other (See Comments), Tachycardia / Palpitations   (intolerance) ?Hospital gowns leave a rash. ?  ? Prednisone Anxiety and Palpitations  ? Prochlorperazine Anxiety  ?  Compazine:  Dystonic reaction  ? Venlafaxine Anxiety  ? Zyrtec [Cetirizine Hcl] Rash  ?  All over body  ? ? ?Physical Exam: ?BP (!) 156/102   Pulse 100   Temp 97.9 ?F (36.6 ?C) (Temporal)   Resp 18   LMP 06/25/2013   SpO2 98%   ? ?Physical Exam ?Vitals reviewed.  ?Constitutional:   ?   Appearance: Normal appearance. She is well-developed.  ?HENT:  ?   Head: Normocephalic and atraumatic.  ?   Right Ear: Tympanic membrane normal.  ?   Left Ear: Tympanic membrane normal.  ?   Nose:  ?   Comments: Bilateral nares slightly erythematous with clear nasal drainage noted.  Pharynx normal.  Ears normal.  Eyes normal. ?   Mouth/Throat:  ?   Pharynx: Oropharynx is clear.  ?  Eyes:  ?   Conjunctiva/sclera: Conjunctivae normal.  ?Cardiovascular:  ?   Rate and Rhythm: Normal rate and regular rhythm.  ?   Heart sounds: Normal heart sounds. No murmur heard. ?Pulmonary:  ?   Effort: Pulmonary effort is normal.  ?   Breath sounds: Normal breath sounds.  ?   Comments: Lungs clear to auscultation.  Patient was able to speak in full sentences. ?Musculoskeletal:     ?   General: Normal range of motion.  ?   Cervical back: Normal range of motion and neck supple.  ?Skin: ?   General: Skin is warm and dry.  ?Neurological:  ?   Mental Status: She is alert and oriented to person, place, and time.  ?Psychiatric:     ?   Mood and Affect: Mood normal.     ?   Behavior: Behavior normal.     ?   Thought Content: Thought content normal.     ?   Judgment: Judgment normal.  ? ? ?Diagnostics: ?FVC 2.26, FEV1 1.97.  Predicted FVC 3.05, predicted FEV1 2.43.  Spirometry indicates possible restriction.  This is consistent with previous spirometry readings. ? ?Assessment and Plan: ?1. Not well controlled moderate persistent asthma   ?2. Chronic rhinitis   ?3. Gastroesophageal reflux disease without esophagitis   ? ? ?Meds ordered this  encounter  ?Medications  ? nystatin (MYCOSTATIN) 100000 UNIT/ML suspension  ?  Sig: Take 5 ml 4 times a day for 1 week for symptoms of thrush.  ?  Dispense:  60 mL  ?  Refill:  0  ? Fluticasone Furoate (ARNUITY ELLIPTA) 100 MCG/ACT AEP

## 2021-12-01 NOTE — Patient Instructions (Addendum)
Asthma ?Continue Xopenex 2 puffs every 6 hours as needed for cough, wheeze,tightness in chest, or shortness of breath OR Xopenex 1.25 mg 1 unit dose via nebulizer every 4-6 hours as needed ?Continue to use aerobika valve 1-2 times a day for 5-10 minutes  ?For asthma flare, begin Arnuity 1 puff once a day for 2 weeks or until cough and wheeze free ?Nystatin swish and swallow has been ordered. Take 5 ml 4 times a day for 1 week for symptoms of thrush. Call the clinic if you need to begin this medication ? ?Reflux ?Continue dietary lifestyle modifications as listed below ?Continue the regimen as prescribed by your gastroenterologist. ? ?Chronic rhinitis ?Continue Flunisolide nasal spray 2 sprays in each nostril up to two times a day.  In the right nostril, point the applicator out toward the right ear. In the left nostril, point the applicator out toward the left ear ?Continue to perform nasal saline rinses 1-3 times a day as needed to help clean sinus tract ? ?Your blood pressure was elevated at today's visit. Contact your primary care provider for further evaluation of your blood pressure.  ? ?Call the clinic if this treatment plan is not working well for you ? ?Follow up in 4 weeks or sooner if needed. ? ?

## 2021-12-02 ENCOUNTER — Other Ambulatory Visit: Payer: Self-pay

## 2021-12-02 DIAGNOSIS — R000 Tachycardia, unspecified: Secondary | ICD-10-CM | POA: Diagnosis not present

## 2021-12-02 MED ORDER — ARNUITY ELLIPTA 100 MCG/ACT IN AEPB: INHALATION_SPRAY | RESPIRATORY_TRACT | 3 refills | Status: DC

## 2021-12-05 DIAGNOSIS — R519 Headache, unspecified: Secondary | ICD-10-CM | POA: Diagnosis not present

## 2021-12-06 ENCOUNTER — Emergency Department (HOSPITAL_BASED_OUTPATIENT_CLINIC_OR_DEPARTMENT_OTHER)
Admission: EM | Admit: 2021-12-06 | Discharge: 2021-12-06 | Disposition: A | Discharge status: Home / Self Care | Payer: Medicare HMO | Attending: Emergency Medicine | Admitting: Emergency Medicine

## 2021-12-06 ENCOUNTER — Encounter (HOSPITAL_BASED_OUTPATIENT_CLINIC_OR_DEPARTMENT_OTHER): Payer: Self-pay

## 2021-12-06 DIAGNOSIS — R112 Nausea with vomiting, unspecified: Secondary | ICD-10-CM | POA: Diagnosis not present

## 2021-12-06 DIAGNOSIS — R110 Nausea: Secondary | ICD-10-CM

## 2021-12-06 DIAGNOSIS — J45909 Unspecified asthma, uncomplicated: Secondary | ICD-10-CM | POA: Diagnosis not present

## 2021-12-06 DIAGNOSIS — R519 Headache, unspecified: Secondary | ICD-10-CM | POA: Insufficient documentation

## 2021-12-06 DIAGNOSIS — H538 Other visual disturbances: Secondary | ICD-10-CM | POA: Diagnosis not present

## 2021-12-06 DIAGNOSIS — Z8541 Personal history of malignant neoplasm of cervix uteri: Secondary | ICD-10-CM | POA: Insufficient documentation

## 2021-12-06 DIAGNOSIS — R079 Chest pain, unspecified: Secondary | ICD-10-CM | POA: Diagnosis not present

## 2021-12-06 DIAGNOSIS — I10 Essential (primary) hypertension: Secondary | ICD-10-CM | POA: Diagnosis not present

## 2021-12-06 MED ORDER — SODIUM CHLORIDE 0.9 % IV BOLUS
500.0000 mL | Freq: Once | INTRAVENOUS | Status: AC
Start: 2021-12-06 — End: 2021-12-06
  Administered 2021-12-06: 500 mL via INTRAVENOUS

## 2021-12-06 MED ORDER — ONDANSETRON 4 MG PO TBDP: 4.0000 mg | ORAL_TABLET | Freq: Three times a day (TID) | ORAL | 0 refills | Status: DC | PRN

## 2021-12-06 MED ORDER — IBUPROFEN 800 MG PO TABS
800.0000 mg | ORAL_TABLET | Freq: Once | ORAL | Status: AC
  Administered 2021-12-06: 800 mg via ORAL
  Filled 2021-12-06: qty 1

## 2021-12-06 MED ORDER — ONDANSETRON HCL 4 MG/2ML IJ SOLN
4.0000 mg | Freq: Once | INTRAMUSCULAR | Status: AC
  Administered 2021-12-06: 4 mg via INTRAVENOUS
  Filled 2021-12-06: qty 2

## 2021-12-06 NOTE — ED Notes (Signed)
Pt care taken no complaints at this time. 

## 2021-12-06 NOTE — Discharge Instructions (Signed)
Your history, exam, evaluation today were overall reassuring given your recent labs and imaging on recent visits.  He did not have any focal neurologic deficits on initial exam and after the medications and fluids her symptoms began to improve.  Given her complex history and medication limitations, we had a shared decision-making conversation and agreed to have you follow-up with outpatient neurology to discuss further complex headache management strategies.  Please rest and stay hydrated and use the Zofran to help with nausea and to maintain hydration.  If any symptoms change or worsen acutely, please return to the nearest emergency department. ?

## 2021-12-06 NOTE — ED Notes (Signed)
Pt said that she feels better, headache has diminished, ?

## 2021-12-06 NOTE — ED Provider Notes (Signed)
?[facility] ?Provider Note ? ? ?CSN: [id number] ?Arrival date & time: 12/06/21  1731 ? ?  ? ?History ? ?No chief complaint on file. ? ? ?Jennifer [name] is a 56 y.o. female. ? ?The history is provided by the patient, medical records and a relative. No language interpreter was used.  ?Headache ?Pain location:  Frontal, L temporal and R temporal ?Quality:  Dull ?Radiates to:  Does not radiate ?Severity currently:  8/10 ?Severity at highest:  8/10 ?Onset quality:  Gradual ?Duration:  3 days ?Timing:  Constant ?Progression:  Waxing and waning ?Chronicity:  Recurrent ?Similar to prior headaches: yes   ?Context: bright light   ?Relieved by:  Nothing ?Worsened by:  Nothing ?Ineffective treatments:  None tried ?Associated symptoms: blurred vision (transiently), nausea, photophobia and vomiting   ?Associated symptoms: no abdominal pain, no back pain, no congestion, no cough, no diarrhea, no dizziness, no eye pain, no facial pain, no fatigue, no fever, no near-syncope, no neck pain, no neck stiffness, no numbness, no paresthesias, no seizures, no sinus pressure and no weakness   ? ?  ? ?Home Medications ?Prior to Admission medications   ?Medication Sig Start Date End Date Taking? Authorizing Provider  ?ACCU-CHEK GUIDE test strip For testing blood sugars dailyDx:r73.03 06/17/21   [name], MD  ?dicyclomine (BENTYL) 20 MG tablet Take 1 tablet (20 mg total) by mouth 2 (two) times daily as needed for spasms. 10/26/21   Gareth Morgan, MD  ?EPINEPHrine 0.3 mg/0.3 mL IJ SOAJ injection Use as directed for severe allergic reactions 11/10/21   Althea Charon, FNP  ?ezetimibe (ZETIA) 10 MG tablet Take 10 mg by mouth daily. 10/13/21   [provider]  ?famotidine (PEPCID) 20 MG tablet Take by mouth. 05/06/21   [provider]  ?flunisolide (NASALIDE) 25 MCG/ACT (0.025%) SOLN Place into the nose. 05/27/21   [provider]  ?Fluticasone Furoate (ARNUITY ELLIPTA) 100  MCG/ACT AEPB 1 puff once a day for 2 weeks or until cough and wheeze free. 12/02/21   Dara Hoyer, FNP  ?levalbuterol (XOPENEX HFA) 45 MCG/ACT inhaler INHALE 2 PUFFS INTO THE LUNGS EVERY 6 HOURS AS NEEDED FOR WHEEZING 11/10/21   Althea Charon, FNP  ?levalbuterol (XOPENEX) 1.25 MG/3ML nebulizer solution Take 1.25 mg by nebulization every 3 (three) hours as needed for wheezing. 09/28/20   Valentina Shaggy, MD  ?lisinopril (ZESTRIL) 5 MG tablet Take 1 tablet (5 mg total) by mouth daily. 11/24/21   [name], MD  ?metoprolol tartrate (LOPRESSOR) 50 MG tablet Take 1 tablet (50 mg total) by mouth 2 (two) times daily. 11/24/21   [name], MD  ?nystatin (MYCOSTATIN) 100000 UNIT/ML suspension Take 5 ml 4 times a day for 1 week for symptoms of thrush. 12/01/21   Ambs, Kathrine Cords, FNP  ?potassium chloride (KLOR-CON) 20 MEQ packet Take 20 mEq by mouth daily. 09/09/21   [name], MD  ?sucralfate (CARAFATE) 1 GM/10ML suspension Take 10 mLs (1 g total) by mouth 4 (four) times daily -  with meals and at bedtime. 11/03/21   [name], MD  ?   ? ?Allergies    ?Azithromycin, Ciprofloxacin, Codeine, Erythromycin base, Sulfa antibiotics, Sulfasalazine, Telmisartan, Ace inhibitors, Aspirin, Atenolol, Avelox [moxifloxacin hcl in nacl], Beta adrenergic blockers, Buspar [buspirone], Butorphanol tartrate, Cetirizine, Clonidine hcl, Cortisone, Erythromycin, Fentanyl, Fluoxetine hcl, Ketorolac tromethamine, Lidocaine, Lisinopril, Metoclopramide hcl, Midazolam, Montelukast, Montelukast sodium, Naproxen, Paroxetine, Penicillins, Pravastatin, Promethazine, Promethazine hcl, Quinolones, Serotonin reuptake inhibitors (ssris),  Sertraline hcl, Stelazine [trifluoperazine], Tobramycin, Trifluoperazine hcl, Atrovent nasal spray [ipratropium], Diltiazem, Polyethylene glycol 3350, Propoxyphene, Adhesive [tape], Butorphanol, Ceftriaxone, Iron, Metoclopramide, Metronidazole, Other, Prednisone, Prochlorperazine,  Venlafaxine, and Zyrtec [cetirizine hcl]   ? ?Review of Systems   ?Review of Systems  ?Constitutional:  Negative for chills, fatigue and fever.  ?HENT:  Negative for congestion and sinus pressure.   ?Eyes:  Positive for blurred vision (transiently), photophobia and visual disturbance (improving after meds). Negative for pain.  ?Respiratory:  Negative for cough, chest tightness, shortness of breath and wheezing.   ?Cardiovascular:  Negative for chest pain, palpitations, leg swelling and near-syncope.  ?Gastrointestinal:  Positive for nausea and vomiting. Negative for abdominal pain, constipation and diarrhea.  ?Genitourinary:  Negative for dysuria.  ?Musculoskeletal:  Negative for back pain, neck pain and neck stiffness.  ?Neurological:  Positive for headaches. Negative for dizziness, seizures, syncope, speech difficulty, weakness, light-headedness, numbness and paresthesias.  ?Psychiatric/Behavioral:  Negative for agitation and confusion.   ?All other systems reviewed and are negative. ? ?Physical Exam ?Updated Vital Signs ?BP (!) 146/78 (BP Location: Right Arm)   Pulse 81   Temp 98.2 ?F (36.8 ?C) (Oral)   Resp 18   Wt 104 kg   LMP 06/25/2013   SpO2 99%   BMI 41.94 kg/m?  ?Physical Exam ?Vitals and nursing note reviewed.  ?Constitutional:   ?   General: She is not in acute distress. ?   Appearance: She is well-developed. She is not ill-appearing, toxic-appearing or diaphoretic.  ?HENT:  ?   Head: Normocephalic and atraumatic.  ?Eyes:  ?   Extraocular Movements: Extraocular movements intact.  ?   Right eye: No nystagmus.  ?   Left eye: Normal extraocular motion and no nystagmus.  ?   Conjunctiva/sclera: Conjunctivae normal.  ?Cardiovascular:  ?   Rate and Rhythm: Normal rate and regular rhythm.  ?   Heart sounds: No murmur heard. ?Pulmonary:  ?   Effort: Pulmonary effort is normal. No respiratory distress.  ?   Breath sounds: Normal breath sounds. No wheezing, rhonchi or rales.  ?Chest:  ?   Chest wall: No  tenderness.  ?Abdominal:  ?   General: There is no distension.  ?   Palpations: Abdomen is soft.  ?   Tenderness: There is no abdominal tenderness.  ?Musculoskeletal:     ?   General: No swelling.  ?   Cervical back: Normal range of motion and neck supple. No rigidity.  ?Skin: ?   General: Skin is warm and dry.  ?   Capillary Refill: Capillary refill takes less than 2 seconds.  ?   Coloration: Skin is not pale.  ?   Findings: No rash.  ?Neurological:  ?   Mental Status: She is alert and oriented to person, place, and time. Mental status is at baseline.  ?   GCS: GCS eye subscore is 4. GCS verbal subscore is 5. GCS motor subscore is 6.  ?   Cranial Nerves: No cranial nerve deficit, dysarthria or facial asymmetry.  ?   Sensory: No sensory deficit.  ?   Motor: No tremor, abnormal muscle tone or seizure activity.  ?   Coordination: Finger-Nose-Finger Test normal.  ?   Gait: Gait normal.  ?   Comments: Symmetric smile.  Normal sensation of the face.  Normal finger-nose-finger testing bilaterally.  Normal gait.  Normal sensation and strength in extremities.  Pupils symmetric and reactive with normal extraocular movements.  ?Psychiatric:     ?  Mood and Affect: Mood normal. Mood is not anxious.  ? ? ?ED Results / Procedures / Treatments   ?Labs ?(all labs ordered are listed, but only abnormal results are displayed) ?Labs Reviewed - No data to display ? ?EKG ?None ? ?Radiology ?No results found. ? ?Procedures ?Procedures  ? ? ?Medications Ordered in ED ?Medications  ?sodium chloride 0.9 % bolus 500 mL (0 mLs Intravenous Stopped 12/06/21 1905)  ?ondansetron [facility]) injection 4 mg (4 mg Intravenous Given 12/06/21 1829)  ?ibuprofen (ADVIL) tablet 800 mg (800 mg Oral Given 12/06/21 1829)  ? ? ?ED Course/ Medical Decision Making/ A&P ?  ?                        ?Medical Decision Making ?Risk ?Prescription drug management. ? ? ? ?ZONA PEDRO is a 56 y.o. female with a complex past medical history including documentation of  chronic headaches, multiple sclerosis, uterine cancer, asthma, hyperlipidemia, hypertension, appendectomy, PTSD, anxiety, depression, and atrial tachycardia who presents with headaches.  According to patient, for the

## 2021-12-06 NOTE — ED Notes (Signed)
EDP at bedside  

## 2021-12-06 NOTE — ED Triage Notes (Addendum)
Pt reports HA and jaw pain x 3 days.  Blurry vision ?

## 2021-12-07 DIAGNOSIS — R457 State of emotional shock and stress, unspecified: Secondary | ICD-10-CM | POA: Diagnosis not present

## 2021-12-07 DIAGNOSIS — R110 Nausea: Secondary | ICD-10-CM | POA: Diagnosis not present

## 2021-12-07 DIAGNOSIS — R9431 Abnormal electrocardiogram [ECG] [EKG]: Secondary | ICD-10-CM | POA: Diagnosis not present

## 2021-12-07 DIAGNOSIS — R079 Chest pain, unspecified: Secondary | ICD-10-CM | POA: Diagnosis not present

## 2021-12-07 DIAGNOSIS — I10 Essential (primary) hypertension: Secondary | ICD-10-CM | POA: Diagnosis not present

## 2021-12-07 DIAGNOSIS — R0789 Other chest pain: Secondary | ICD-10-CM | POA: Diagnosis not present

## 2021-12-07 DIAGNOSIS — D696 Thrombocytopenia, unspecified: Secondary | ICD-10-CM | POA: Diagnosis not present

## 2021-12-08 ENCOUNTER — Other Ambulatory Visit: Payer: Self-pay | Admitting: *Deleted

## 2021-12-08 ENCOUNTER — Encounter: Payer: Self-pay | Admitting: Family Medicine

## 2021-12-08 DIAGNOSIS — R7989 Other specified abnormal findings of blood chemistry: Secondary | ICD-10-CM

## 2021-12-08 DIAGNOSIS — R079 Chest pain, unspecified: Secondary | ICD-10-CM | POA: Diagnosis not present

## 2021-12-09 ENCOUNTER — Encounter: Payer: Self-pay | Admitting: Family Medicine

## 2021-12-09 ENCOUNTER — Ambulatory Visit (INDEPENDENT_AMBULATORY_CARE_PROVIDER_SITE_OTHER): Payer: Medicare HMO | Admitting: Family Medicine

## 2021-12-09 VITALS — BP 153/64 | HR 103 | Ht 62.0 in | Wt 234.0 lb

## 2021-12-09 DIAGNOSIS — R7989 Other specified abnormal findings of blood chemistry: Secondary | ICD-10-CM | POA: Diagnosis not present

## 2021-12-09 DIAGNOSIS — R7301 Impaired fasting glucose: Secondary | ICD-10-CM

## 2021-12-09 DIAGNOSIS — C519 Malignant neoplasm of vulva, unspecified: Secondary | ICD-10-CM | POA: Diagnosis not present

## 2021-12-09 DIAGNOSIS — I10 Essential (primary) hypertension: Secondary | ICD-10-CM

## 2021-12-09 DIAGNOSIS — F418 Other specified anxiety disorders: Secondary | ICD-10-CM

## 2021-12-09 LAB — POCT GLYCOSYLATED HEMOGLOBIN (HGB A1C): Hemoglobin A1C: 6.1 % — AB (ref 4.0–5.6)

## 2021-12-09 NOTE — Assessment & Plan Note (Signed)
Blood pressure still uncontrolled.  We will see if we can get her connected for 24 ambulatory blood pressure monitoring its been challenging to find this available in our area for quite some time but I will check with [facility] to see if this is something they are now offering.  Would still low for her to start the lisinopril sometime soon. ?

## 2021-12-09 NOTE — Progress Notes (Signed)
? ?Established Patient Office Visit ? ?Subjective   ?Patient ID: Jennifer [name], female    DOB: 1966-02-16  Age: 56 y.o. MRN: [id number] ? ?Chief Complaint  ?Patient presents with  ? Follow-up  ?   ?  ? ? ?HPI ?She is here today for follow-up. ? ?HTN-blood pressures at home and have been running in the 1 40-1 50 range over 36U diastolic.  She is getting scheduled for a 24 ambulatory blood pressure monitor but really does not want to have it done at [facility].  She finds it pretty difficult to maneuver that hospital and parking deck etc. and would like to know if: Would be able to offer that service.  She has held off on starting the lisinopril 5 mg until then.  She did have a couple of episodes where her blood pressure went higher 1 day her systolic was 440.  She was in the emergency department on April 26 for left-sided chest pain.  Platelets were noted to be a little borderline low those have been rechecked and the repeat was normal. ? ?She has been having more headaches particularly over the temples and jaw pain at the base of her jaw bilaterally over the last week. ? ?She does have her first counseling appointment coming up on May 3.  She just wants to make that the sure that the referral was in place so that her insurance will cover it. ? 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 ? ? ? ?ROS ? ?  ?Objective:  ?  ? ?BP (!) 153/64   Pulse (!) 103   Ht '5\' 2"'$  (1.575 m)   Wt 234 lb (106.1 kg)   LMP 06/25/2013   SpO2 93%   BMI 42.80 kg/m?  ? ? ?Physical Exam ? ? ?Results for orders placed or performed in visit on 12/09/21  ?POCT glycosylated hemoglobin (Hb A1C)  ?Result Value Ref Range  ? Hemoglobin A1C 6.1 (A) 4.0 - 5.6 %  ? HbA1c POC (<> result, manual entry)    ? HbA1c, POC (prediabetic range)    ? HbA1c,  POC (controlled diabetic range)    ? ? ? ? ?The ASCVD Risk score (Arnett DK, et al., 2019) failed to calculate for the following reasons: ?  The patient has a prior MI or stroke diagnosis ? ?  ?Assessment & Plan:  ? ?Problem List Items Addressed This Visit   ? ?  ? Cardiovascular and Mediastinum  ? Hypertension with intolerance to multiple antihypertensive drugs  ?  Blood pressure still uncontrolled.  We will see if we can get her connected for 24 ambulatory blood pressure monitoring its been challenging to find this available in our area for quite some time but I will check with [facility] to see if this is something they are now offering.  Would still low for her to start the lisinopril sometime soon. ? ?  ?  ?  ? Endocrine  ? IFG (impaired fasting glucose) - Primary  ?  A1c today is at 6.1.  It is actually little bit better than it was back in August when it was 6.3.  Continue to work on [employer] and regular exercise. ? ?Lab Results  ?Component Value Date  ?  HGBA1C 6.1 (A) 12/09/2021  ? ? ?  ?  ? Relevant Orders  ? POCT glycosylated hemoglobin (Hb A1C) (Completed)  ?  ? Musculoskeletal and Integument  ? 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  Encouraged her to go ahead and schedule a follow-up appointment even though she is a little behind especially if the area is changing and becoming larger and more irritated. ? ?  ?  ?  ? Other  ? Depression with anxiety  ?  I am happy to hear that she has a counseling appointment coming up soon we will make sure that the referral is in place. ? ?  ?  ? ? ?Return in about 2 weeks (around 12/23/2021).  ? ?I spent 40 minutes on the day of the encounter to include pre-visit record review, face-to-face time with the patient and post visit ordering of test. ? ? ?Beatrice Lecher, MD ? ?

## 2021-12-09 NOTE — Assessment & Plan Note (Signed)
I am happy to hear that she has a counseling appointment coming up soon we will make sure that the referral is in place. ?

## 2021-12-09 NOTE — Assessment & Plan Note (Signed)
A1c today is at 6.1.  It is actually little bit better than it was back in August when it was 6.3.  Continue to work on [employer] and regular exercise. ? ?Lab Results  ?Component Value Date  ? HGBA1C 6.1 (A) 12/09/2021  ? ? ?

## 2021-12-09 NOTE — Assessment & Plan Note (Signed)
Encouraged her to go ahead and schedule a follow-up appointment even though she is a little behind especially if the area is changing and becoming larger and more irritated. ?

## 2021-12-12 DIAGNOSIS — R109 Unspecified abdominal pain: Secondary | ICD-10-CM | POA: Diagnosis not present

## 2021-12-12 DIAGNOSIS — I10 Essential (primary) hypertension: Secondary | ICD-10-CM | POA: Diagnosis not present

## 2021-12-12 DIAGNOSIS — N2889 Other specified disorders of kidney and ureter: Secondary | ICD-10-CM | POA: Diagnosis not present

## 2021-12-12 DIAGNOSIS — Q6102 Congenital multiple renal cysts: Secondary | ICD-10-CM | POA: Diagnosis not present

## 2021-12-12 DIAGNOSIS — K5730 Diverticulosis of large intestine without perforation or abscess without bleeding: Secondary | ICD-10-CM | POA: Diagnosis not present

## 2021-12-12 DIAGNOSIS — I700 Atherosclerosis of aorta: Secondary | ICD-10-CM | POA: Diagnosis not present

## 2021-12-12 DIAGNOSIS — K5790 Diverticulosis of intestine, part unspecified, without perforation or abscess without bleeding: Secondary | ICD-10-CM | POA: Diagnosis not present

## 2021-12-12 DIAGNOSIS — Z87891 Personal history of nicotine dependence: Secondary | ICD-10-CM | POA: Diagnosis not present

## 2021-12-15 ENCOUNTER — Encounter: Payer: Self-pay | Admitting: Family

## 2021-12-15 ENCOUNTER — Telehealth: Payer: Self-pay | Admitting: Family Medicine

## 2021-12-15 ENCOUNTER — Ambulatory Visit: Payer: Medicare HMO | Admitting: Family

## 2021-12-15 VITALS — BP 142/88 | HR 93 | Temp 98.2°F | Resp 20

## 2021-12-15 DIAGNOSIS — J4540 Moderate persistent asthma, uncomplicated: Secondary | ICD-10-CM

## 2021-12-15 DIAGNOSIS — J310 Chronic rhinitis: Secondary | ICD-10-CM

## 2021-12-15 DIAGNOSIS — K219 Gastro-esophageal reflux disease without esophagitis: Secondary | ICD-10-CM | POA: Diagnosis not present

## 2021-12-15 MED ORDER — ALVESCO 80 MCG/ACT IN AERS: INHALATION_SPRAY | RESPIRATORY_TRACT | 2 refills | Status: DC

## 2021-12-15 MED ORDER — TRIAMCINOLONE ACETONIDE 55 MCG/ACT NA AERO
INHALATION_SPRAY | NASAL | 5 refills | Status: DC
Start: 2021-12-15 — End: 2021-12-23

## 2021-12-15 NOTE — Telephone Encounter (Signed)
Patient called demanding an 10 minute appt with Dr. Madilyn Fireman because she is has a sore throat, and ear ache. Dr. Madilyn Fireman does not have any available appts. Before I could offer the patient an appt to see another provider she hung up on me. Please advise.  ?

## 2021-12-15 NOTE — Progress Notes (Signed)
? ?[name] [id number] ?Dept: [phone] ? ?FOLLOW UP NOTE ? ?Patient ID: Jennifer [name], female    DOB: May 21, 1966  Age: 56 y.o. MRN: [id number] ?Date of Office Visit: 12/15/2021 ? ?Assessment  ?Chief Complaint: Asthma (Pt is present to feeling of inflated lung without cough or wheezing.) ? ?HPI ?Jennifer [name] is a 56 year old female who presents today for an acute visit.  She was last seen on December 01, 2021 by Gareth Morgan, FNP for not well controlled moderate persistent asthma, chronic rhinitis, and gastroesophageal reflux disease without esophagitis.  Since her last office visit she denies any new diagnosis or surgeries. ? ?Moderate persistent asthma is reported as not well controlled with Xopenex as needed.  She stopped taking Arnuity that was prescribed at the last office visit due to it causing her to break out.  She reports in the past when she has tried Arnuity it did the same thing.  Yesterday she had a bad coughing spell and wondered if there was a gas leak at her house.  She mentions that no one else felt bad.  She felt rough.  She was coughing so much that she could not catch her breath from coughing and she threw up thick mucus.  After she coughed up the thick mucus she could breathe better.  She did a at-home COVID-19 test at home today and reports it was negative.  She mentions that her lungs feel like inflated balloons.  Her cough is reported as the majority of the times nonproductive, but when it is productive it is thick white sputum.  She has wheezing sometimes, but she feels like the wheezing is coming from her throat.  She has tightness here and there, shortness of breath depending on her activity,and nocturnal awakenings due to breathing problems.  She is interested in [place] helping her get a BiPAP.  She reports in the past she did a sleep study in [place] years ago and the CPAP was too much pressure but they had problems getting the BiPAP approved.  She is not currently  using anything for her sleep apnea.  She only wants to see a sleep physician in [facility] due to transportation issues.  Instructed that we would try to help her, but if not she could speak with her primary care physician about this.  She is not able to tolerate inhaled corticosteroids due to it causing her tongue to be white she will have to use nystatin.  She can no longer take prednisone due to it causing her blood sugar to rise and she is also prediabetic.  She is able to tolerate dexamethasone but it has to be a very low dose.  She also mentions that steroids "mess with her heart".  She has tried Spiriva Respimat and developed symptoms including intermittent headache, inflated feeling in her lungs, and a loopy feeling.  She is not able to take Singulair and was instructed to never take it again.  She is using her Xopenex inhaler under 10 times a week.  She did not use her Xopenex today so we could see what her "lungs looked like."  She reports that Xopenex helps with the tightness in her chest, but there are other times she wonders if it helps at all. ? ?She continues to have reflux symptoms with famotidine once a day to twice a day and Carafate as needed.  She reports that she does not do well on PPIs and has a history of  Barrett's esophagus.  In the past she was on ranitidine and it helped, but this medication is no longer available.  She continues to follow-up with GI and is waiting on a scope. ? ?Chronic rhinitis is reported as not well controlled with flunisolide 2 sprays each nostril twice a day.  She reports that she is using saline rinses more frequently.  She reports nasal congestion and postnasal drip.  She denies rhinorrhea.  She is not sure if she has had any sinus infections.  She does not feel like flunisolide nasal spray helps.  She has tried [facility] and this caused nosebleeds. Nasonex, Astelin, and Qnasl helped initially and then do not work.  She thinks that she has tried Nasacort nasal spray,  but is willing to try this again.  Offered ipratropium bromide to help dry up the postnasal drip and discovered that it is on her allergy list causing tachycardia and shaking.  She reports she does have a cardiac history.  Also she reports yesterday that her eyes were burning bad yesterday, but they are not as bad today.  She denies any vision changes, drainage from her eyes, or red eye.  She reports she has a history of chronic dry eyes.  Her throat also bothered her yesterday but she was outside so she wonders if this was pollen. ? ?Drug Allergies:  ?Allergies  ?Allergen Reactions  ? Azithromycin Shortness Of Breath  ?  Lip swelling, SOB.  ?   ? Ciprofloxacin Swelling  ?  REACTION: tongue swells  ? Codeine Shortness Of Breath  ? Erythromycin Base Itching and Rash  ? Sulfa Antibiotics Shortness Of Breath, Rash and Other (See Comments)  ? Sulfasalazine Rash and Shortness Of Breath  ?  Other reaction(s): Other (See Comments) ?Other reaction(s): SHORTNESS OF BREATH  ? Telmisartan Swelling  ?  Tongue swelling, Micardis  ? Ace Inhibitors Cough  ? Aspirin Hives and Other (See Comments)  ?  flushing  ? Atenolol Other (See Comments)  ?  Squeezing chest sensation  ? Avelox [Moxifloxacin Hcl In Nacl] Itching  ?     ? Beta Adrenergic Blockers Other (See Comments)  ?  Feels like chest tightening labetalol, bystolic  ?Feels like chest tightening "Metoprolol" ?  ? Buspar [Buspirone] Other (See Comments)  ?  Light headed  ? Butorphanol Tartrate Other (See Comments)  ?  Patient aggitated  ? Cetirizine Hives and Rash  ?     ? Clonidine Hcl   ?  REACTION: makes blood pressure high  ? Cortisone   ?  Feels like she is going crazy  ? Erythromycin Rash  ? Fentanyl Other (See Comments)  ?  aggressive   ? Fluoxetine Hcl Other (See Comments)  ?  REACTION: headaches  ? Ketorolac Tromethamine   ?  jittery  ? Lidocaine Other (See Comments)  ?  When it involves the throat,   ? Lisinopril Cough  ? Metoclopramide Hcl Other (See Comments)  ?   Dystonic reaction  ? Midazolam Other (See Comments)  ?  agitation ?Slow to wake up  ? Montelukast Other (See Comments)  ?  Singulair  ? Montelukast Sodium Other (See Comments)  ?  DOES NOT REMEMBER ? ?Don't remember-told not to take  ? Naproxen Other (See Comments)  ?  FLUSHING ?Pt states she took Ibuprofen today (10/08/19)  ? Paroxetine Other (See Comments)  ?  REACTION: headaches  ? Penicillins Rash  ? Pravastatin Other (See Comments)  ?  Myalgias  ? Promethazine  Other (See Comments)  ?  Dystonic reaction  ? Promethazine Hcl Other (See Comments)  ?  jittery  ? Quinolones Swelling and Rash  ? Serotonin Reuptake Inhibitors (Ssris) Other (See Comments)  ?  Headache ?Effexor, prozac, zoloft,   ? Sertraline Hcl   ?  REACTION: headaches  ? Stelazine [Trifluoperazine] Other (See Comments)  ?  Dystonic reaction  ? Tobramycin Itching and Rash  ? Trifluoperazine Hcl   ?  dystonic  ? Atrovent Nasal Spray [Ipratropium]   ?  Tachycardia and shaking  ? Diltiazem Other (See Comments)  ?  Chest pain  ? Iodinated Contrast Media   ?  Other reaction(s): Other (See Comments) ?Chest heaviness/sob  ? Polyethylene Glycol 3350   ?  Other reaction(s): Laryngeal Edema (ALLERGY)  ? Propoxyphene   ? Adhesive [Tape] Rash  ?  EKG monitor patches, some tapes ?Blisters, rash, itching, welts.  ? Butorphanol Anxiety  ?  Patient agitated  ? Ceftriaxone Rash  ?  rocephin  ? Iron Rash  ?  Flushing with certain IV types  ? Metoclopramide Itching and Other (See Comments)  ?  Dystonic reaction  ? Metronidazole Rash  ? Other Rash and Other (See Comments)  ?  Uncoded Allergy. Allergen: steriods, Other Reaction: Not Assessed ?Other reaction(s): Flushing (ALLERGY/intolerance), GI Upset (intolerance), Hypertension (intolerance), Increased Heart Rate (intolerance), Mental Status Changes (intolerance), Other (See Comments), Tachycardia / Palpitations  (intolerance) ?Hospital gowns leave a rash. ?  ? Prednisone Anxiety and Palpitations  ? Prochlorperazine  Anxiety  ?  Compazine:  Dystonic reaction  ? Venlafaxine Anxiety  ? Zyrtec [Cetirizine Hcl] Rash  ?  All over body  ? ? ?Review of Systems: ?Review of Systems  ?Constitutional:  Negative for chills and fever.

## 2021-12-15 NOTE — Patient Instructions (Addendum)
Asthma ?Continue Xopenex 2 puffs every 6 hours as needed for cough, wheeze,tightness in chest, or shortness of breath OR Xopenex 1.25 mg 1 unit dose via nebulizer every 4-6 hours as needed ?Continue to use aerobika valve 1-2 times a day for 5-10 minutes  ?For asthma flare, begin Alvesco  80 mcg 1 puff twice a day for 2 weeks or until cough and wheeze free. We will start you with a lower dose due to your reactions to inhalers in the past ?Keep your appointment with [facility] on 12/30/21 ? ? ?Reflux ?Continue dietary lifestyle modifications as listed below ?Continue the regimen as prescribed by your gastroenterologist. ? ?Chronic rhinitis ?Stop Flunisolide  ?Start Nasacort nasal spray 2 sprays in each nostril once a day a day as needed for stuffy nose.  In the right nostril, point the applicator out toward the right ear. In the left nostril, point the applicator out toward the left ear ?Continue to perform nasal saline rinses 1-3 times a day as needed to help clean sinus tract ?  ?We will try to help refer you to a sleep specialist, but if we are not able to help you  your primary care physician should be able to. ?Please contact the city if you have concerns for a gas leak.  Recommend getting a carbon monoxide detector ? ?Call the clinic if this treatment plan is not working well for you ? ?Follow up in 2 months or sooner if needed. ? ?

## 2021-12-19 ENCOUNTER — Telehealth (INDEPENDENT_AMBULATORY_CARE_PROVIDER_SITE_OTHER): Payer: Medicare HMO | Admitting: Family Medicine

## 2021-12-19 ENCOUNTER — Telehealth: Payer: Self-pay | Admitting: Family

## 2021-12-19 DIAGNOSIS — Z5321 Procedure and treatment not carried out due to patient leaving prior to being seen by health care provider: Secondary | ICD-10-CM | POA: Diagnosis not present

## 2021-12-19 DIAGNOSIS — F418 Other specified anxiety disorders: Secondary | ICD-10-CM | POA: Diagnosis not present

## 2021-12-19 DIAGNOSIS — F339 Major depressive disorder, recurrent, unspecified: Secondary | ICD-10-CM | POA: Diagnosis not present

## 2021-12-19 DIAGNOSIS — R0602 Shortness of breath: Secondary | ICD-10-CM | POA: Diagnosis not present

## 2021-12-19 NOTE — Progress Notes (Signed)
? ? ?Virtual Visit via Video Note ? ?I connected with Jennifer [name] on 12/20/21 at  3:20 PM EDT by a video enabled telemedicine application and verified that I am speaking with the correct person using two identifiers. ?  ?I discussed the limitations of evaluation and management by telemedicine and the availability of in person appointments. The patient expressed understanding and agreed to proceed. ? ?Patient location: at home ?Provider location: in office ? ?Subjective:   ? ?CC:  No chief complaint on file. ? ? ?HPI: ?She is having to move things in her home bc the apartments are doing her floors.  . ? ?She says the appt with [facility] fell through because they were going to set her up with psychiatry instead of a counselor or therapist. She wants to see a counselor. ? ?Got into an argument with her daughter in law . DIL is threatening to keep her granddaughter from her and this has been stressful. She is not sleeping well. This has been stressful. She is feeling really down. Having thoughts of not being here.  Waking up feeling dreadful. She doesn't feel like she has an active plan to harm herself.  She is really distraught about this.  Her relationship with her granddaughter is incredibly important to her and very meaningful. ? ? ?Past medical history, Surgical history, Family history not pertinant except as noted below, Social history, Allergies, and medications have been entered into the medical record, reviewed, and corrections made.  ? ? ?Objective:   ? ?General: Speaking clearly in complete sentences without any shortness of breath.  Alert and oriented x3.  Normal judgment. No apparent acute distress. ? ? ? ?Impression and Recommendations:   ? ?Problem List Items Addressed This Visit   ? ?  ? Other  ? Depression, recurrent ([place])  ? Depression with anxiety - Primary  ?  Discussed some options.  She has had some fleeting thoughts of not being here but no active plans to harm herself.  If the situation just  feels a little overwhelming at the moment.  We discussed using her strategies such as deep breathing and really working on understanding that how she feels is in the moment.  Encouraged her to check with her insurance to see if they would cover any of the behavioral health apps that she could get quick and easy access to and even be seen potentially today or tomorrow with the idea that we can hopefully get her in with somebody that can be more long-term therapist for her.  Knowing that that might take a month or 2 to get her an appointment.  We will go ahead and move forward behavioral health referral.  And again strongly encouraged her to give her insurance a call to see if they will cover any of the smart phone app behavioral health programs.  We discussed that I had quite a few patients use this to really bridge with her mental health care and its been incredibly helpful and powerful. ? ?  ?  ? Relevant Orders  ? Ambulatory referral to [place]  ? ? ?Orders Placed This Encounter  ?Procedures  ? Ambulatory referral to [facility]  ?  Referral Priority:   Routine  ?  Referral Type:   Psychiatric  ?  Referral Reason:   Specialty Services Required  ?  Requested Specialty:   Behavioral Health  ?  Number of Visits Requested:   1  ? ? ?No orders of the defined types  were placed in this encounter. ? ?I spent 30 minutes on the day of the encounter to include pre-visit record review, face-to-face time with the patient and post visit ordering of test.\ ? ?I discussed the assessment and treatment plan with the patient. The patient was provided an opportunity to ask questions and all were answered. The patient agreed with the plan and demonstrated an understanding of the instructions. ?  ?The patient was advised to call back or seek an in-person evaluation if the symptoms worsen or if the condition fails to improve as anticipated. ? ? ?Beatrice Lecher, MD  ? ?

## 2021-12-19 NOTE — Telephone Encounter (Signed)
pt called the pharmacy and alvesco not covered pharmacy said a PA could done. she can't afford anything over $20 ?

## 2021-12-20 ENCOUNTER — Emergency Department (HOSPITAL_BASED_OUTPATIENT_CLINIC_OR_DEPARTMENT_OTHER): Payer: Medicare HMO

## 2021-12-20 ENCOUNTER — Emergency Department (HOSPITAL_BASED_OUTPATIENT_CLINIC_OR_DEPARTMENT_OTHER)
Admission: EM | Admit: 2021-12-20 | Discharge: 2021-12-20 | Disposition: A | Discharge status: Home / Self Care | Payer: Medicare HMO | Attending: Emergency Medicine | Admitting: Emergency Medicine

## 2021-12-20 ENCOUNTER — Other Ambulatory Visit: Payer: Self-pay

## 2021-12-20 ENCOUNTER — Ambulatory Visit: Payer: Medicare HMO | Admitting: Internal Medicine

## 2021-12-20 ENCOUNTER — Encounter (HOSPITAL_BASED_OUTPATIENT_CLINIC_OR_DEPARTMENT_OTHER): Payer: Self-pay

## 2021-12-20 DIAGNOSIS — Z8541 Personal history of malignant neoplasm of cervix uteri: Secondary | ICD-10-CM | POA: Insufficient documentation

## 2021-12-20 DIAGNOSIS — Z79899 Other long term (current) drug therapy: Secondary | ICD-10-CM | POA: Diagnosis not present

## 2021-12-20 DIAGNOSIS — J45909 Unspecified asthma, uncomplicated: Secondary | ICD-10-CM | POA: Diagnosis not present

## 2021-12-20 DIAGNOSIS — I10 Essential (primary) hypertension: Secondary | ICD-10-CM

## 2021-12-20 DIAGNOSIS — J4541 Moderate persistent asthma with (acute) exacerbation: Secondary | ICD-10-CM

## 2021-12-20 DIAGNOSIS — G35 Multiple sclerosis: Secondary | ICD-10-CM

## 2021-12-20 DIAGNOSIS — J45901 Unspecified asthma with (acute) exacerbation: Secondary | ICD-10-CM | POA: Diagnosis not present

## 2021-12-20 DIAGNOSIS — R0602 Shortness of breath: Secondary | ICD-10-CM | POA: Insufficient documentation

## 2021-12-20 DIAGNOSIS — R059 Cough, unspecified: Secondary | ICD-10-CM | POA: Diagnosis not present

## 2021-12-20 DIAGNOSIS — Z9011 Acquired absence of right breast and nipple: Secondary | ICD-10-CM | POA: Insufficient documentation

## 2021-12-20 DIAGNOSIS — R079 Chest pain, unspecified: Secondary | ICD-10-CM | POA: Diagnosis not present

## 2021-12-20 DIAGNOSIS — R7989 Other specified abnormal findings of blood chemistry: Secondary | ICD-10-CM | POA: Diagnosis not present

## 2021-12-20 DIAGNOSIS — Z87891 Personal history of nicotine dependence: Secondary | ICD-10-CM | POA: Insufficient documentation

## 2021-12-20 DIAGNOSIS — Z5321 Procedure and treatment not carried out due to patient leaving prior to being seen by health care provider: Secondary | ICD-10-CM | POA: Diagnosis not present

## 2021-12-20 DIAGNOSIS — R0789 Other chest pain: Secondary | ICD-10-CM | POA: Insufficient documentation

## 2021-12-20 DIAGNOSIS — R000 Tachycardia, unspecified: Secondary | ICD-10-CM | POA: Diagnosis not present

## 2021-12-20 DIAGNOSIS — R002 Palpitations: Secondary | ICD-10-CM | POA: Diagnosis not present

## 2021-12-20 LAB — BASIC METABOLIC PANEL
Anion gap: 8 (ref 5–15)
BUN: 16 mg/dL (ref 6–20)
CO2: 23 mmol/L (ref 22–32)
Calcium: 8.6 mg/dL — ABNORMAL LOW (ref 8.9–10.3)
Chloride: 109 mmol/L (ref 98–111)
Creatinine, Ser: 0.58 mg/dL (ref 0.44–1.00)
GFR, Estimated: >60 mL/min (ref >60)
Glucose, Bld: 124 mg/dL — ABNORMAL HIGH (ref 70–99)
Potassium: 3.4 mmol/L — ABNORMAL LOW (ref 3.5–5.1)
Sodium: 140 mmol/L (ref 135–145)

## 2021-12-20 LAB — CBC
HCT: 41.2 % (ref 36.0–46.0)
Hemoglobin: 13.6 g/dL (ref 12.0–15.0)
MCH: 29.8 pg (ref 26.0–34.0)
MCHC: 33 g/dL (ref 30.0–36.0)
MCV: 90.2 fL (ref 80.0–100.0)
Platelets: 197 10*3/uL (ref 150–400)
RBC: 4.57 MIL/uL (ref 3.87–5.11)
RDW: 13.5 % (ref 11.5–15.5)
WBC: 7.3 10*3/uL (ref 4.0–10.5)
nRBC: 0 % (ref 0.0–0.2)

## 2021-12-20 LAB — TROPONIN I (HIGH SENSITIVITY): Troponin I (High Sensitivity): 4 ng/L (ref <18)
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
## 2021-12-20 NOTE — Assessment & Plan Note (Signed)
Discussed some options.  She has had some fleeting thoughts of not being here but no active plans to harm herself.  If the situation just feels a little overwhelming at the moment.  We discussed using her strategies such as deep breathing and really working on understanding that how she feels is in the moment.  Encouraged her to check with her insurance to see if they would cover any of the behavioral health apps that she could get quick and easy access to and even be seen potentially today or tomorrow with the idea that we can hopefully get her in with somebody that can be more long-term therapist for her.  Knowing that that might take a month or 2 to get her an appointment.  We will go ahead and move forward behavioral health referral.  And again strongly encouraged her to give her insurance a call to see if they will cover any of the smart phone app behavioral health programs.  We discussed that I had quite a few patients use this to really bridge with her mental health care and its been incredibly helpful and powerful. ?

## 2021-12-20 NOTE — Telephone Encounter (Signed)
PA SUBMITTED THRU COVER MY MEDS WAITING ON RESPONSE FROM [employer] ?

## 2021-12-20 NOTE — Patient Outreach (Signed)
Received a call from Jennifer [name] needing help with social work, nursing and medication assistance, and resources. ?Jennifer [name] primary care physician has a [place] team I have sent a referral to the embedded team to call for follow up and determine if there are any Case Management needs.  ?  ?[place] Care, CBCS, CMAA ?[place] Management Assistant ?[facility] ?[phone]   ?

## 2021-12-20 NOTE — ED Triage Notes (Signed)
Complaining of chest pain and palpitations that started last night. Had moved furniture yesterday. ?

## 2021-12-21 ENCOUNTER — Telehealth: Payer: Self-pay | Admitting: *Deleted

## 2021-12-21 NOTE — Chronic Care Management (AMB) (Signed)
  Care Management   Outreach Note  12/21/2021 Name: Jennifer [name] MRN: [id number] DOB: 10/30/1965  Referred by: [name], MD Reason for referral : Care Coordination (Outreach to schedule referral with Licensed Clinical SW and RNCM )   An unsuccessful telephone outreach was attempted today. The patient was referred to the case management team for assistance with care management and care coordination.   Follow Up Plan:  A HIPAA compliant phone message was left for the patient providing contact information and requesting a return call.   [name], [place] [facility] Management  Direct Dial: [phone]

## 2021-12-21 NOTE — Telephone Encounter (Signed)
Pts insurance approved the pa for alvesco until 08/13/2022 ? ?

## 2021-12-23 ENCOUNTER — Encounter: Payer: Self-pay | Admitting: Family Medicine

## 2021-12-23 ENCOUNTER — Ambulatory Visit (INDEPENDENT_AMBULATORY_CARE_PROVIDER_SITE_OTHER): Payer: Medicare HMO | Admitting: Family Medicine

## 2021-12-23 VITALS — BP 157/80 | HR 82 | Ht 62.0 in | Wt 232.0 lb

## 2021-12-23 DIAGNOSIS — I10 Essential (primary) hypertension: Secondary | ICD-10-CM

## 2021-12-23 DIAGNOSIS — F418 Other specified anxiety disorders: Secondary | ICD-10-CM | POA: Diagnosis not present

## 2021-12-23 DIAGNOSIS — R0789 Other chest pain: Secondary | ICD-10-CM

## 2021-12-23 NOTE — Progress Notes (Signed)
? ?Established Patient Office Visit ? ?Subjective   ?Patient ID: Jennifer [name], female    DOB: 02-12-1966  Age: 56 y.o. MRN: [id number] ? ?Chief Complaint  ?Patient presents with  ? Depression  ? ? ?HPI ? ?She is here today for follow-up.  We did a video visit earlier this week where she was really struggling with her depression and anxiety.  She has been trying to get out and get some walks and where she is just felt really stressed and overwhelmed and does try to get outside and walk.  She still occasionally having thoughts of not wanting to be here but no active plan to harm herself.  She is still really stressed and upset that this may have ruined her relationship with her granddaughter as she feels like her daughter-in-law will most likely not allow her to have that relationship with her granddaughter anymore.  Please see the previous notes.  I had encouraged her to reach out to her health insurance to see if they would allow her to do online video visits for therapy/counseling.  She said she did try to call them but she is also switched her phone number so had difficulty getting additional information but it sounds like they may have their own program that she could do some short-term therapy or counseling with.  She just really wants to be able to move out of her apartment as the person who has caused a lot of stress for her lives right across from her so she is really been pushing her husband for them to move.  Her stepdaughter is also currently staying with them. ? ?She also says she might be interested in services that [facility] could offer her.  They had reached out at one point and she had declined it but said she might be open to that now. ? ?He did go to the emergency department recently because she was having some shortness of breath and wheezing.  They treated her for an asthma exacerbation. ? ?She has been having a little bit of left-sided chest pain radiating into her left arm today.  She has had this  similar pain on and off for years. ? ?  ? ?ROS ? ?  ?Objective:  ?  ? ?BP (!) 157/80   Pulse 82   Ht '5\' 2"'$  (1.575 m)   Wt 232 lb (105.2 kg)   LMP 06/25/2013   SpO2 95%   BMI 42.43 kg/m?  ?  ? ?Physical Exam ?Vitals and nursing note reviewed.  ?Constitutional:   ?   Appearance: She is well-developed.  ?HENT:  ?   Head: Normocephalic and atraumatic.  ?Cardiovascular:  ?   Rate and Rhythm: Normal rate and regular rhythm.  ?   Heart sounds: Normal heart sounds.  ?Pulmonary:  ?   Effort: Pulmonary effort is normal.  ?   Breath sounds: Normal breath sounds.  ?Skin: ?   General: Skin is warm and dry.  ?Neurological:  ?   Mental Status: She is alert and oriented to person, place, and time.  ?Psychiatric:     ?   Behavior: Behavior normal.  ? ? ?No results found for any visits on 12/23/21. ? ?  ? ?The ASCVD Risk score (Arnett DK, et al., 2019) failed to calculate for the following reasons: ?  The patient has a prior MI or stroke diagnosis ? ?  ?Assessment & Plan:  ? ?Problem List Items Addressed This Visit   ? ?  ?  Cardiovascular and Mediastinum  ? Hypertension with intolerance to multiple antihypertensive drugs  ?  Pressure is still uncontrolled today she has not started the 5 mg lisinopril yet.  She is taking her metoprolol but says she just took it not long before she left the house to come here today.  Still really want her to try the medication and it is really important we get her blood pressure down especially as it can worsen things like her chest pain that she has been having intermittently. ? ?  ?  ?  ? Other  ? Depression with anxiety - Primary  ?  Gust again that I really want her to do some online therapy where she could get very quick access in a very short period of time to just bridge her until we can get her in with somebody more long-term.  I really think this could be incredibly helpful and powerful.  I did encourage her to reach out for help especially if she is having thoughts of not wanting to be  here.  Again she does not have an active plan to harm herself or others.  We also discussed medication as an option if that something that she is on at all interested though I feel like a lot of this really is situational and being able to talk with someone will be most helpful in the short-term and long-term. ? ?  ?  ? ?Other Visit Diagnoses   ? ? Atypical chest pain      ? ?  ? ? ?Atypical chest pain-no symptoms consistent with a cardiac event.  I think a lot of this is stress related. ? ?No follow-ups on file.  ? ?I spent 42 minutes on the day of the encounter to include pre-visit record review, face-to-face time with the patient and post visit ordering of test. ? ?Beatrice Lecher, MD ? ?

## 2021-12-23 NOTE — Assessment & Plan Note (Signed)
Pressure is still uncontrolled today she has not started the 5 mg lisinopril yet.  She is taking her metoprolol but says she just took it not long before she left the house to come here today.  Still really want her to try the medication and it is really important we get her blood pressure down especially as it can worsen things like her chest pain that she has been having intermittently. ?

## 2021-12-23 NOTE — Assessment & Plan Note (Signed)
Gust again that I really want her to do some online therapy where she could get very quick access in a very short period of time to just bridge her until we can get her in with somebody more long-term.  I really think this could be incredibly helpful and powerful.  I did encourage her to reach out for help especially if she is having thoughts of not wanting to be here.  Again she does not have an active plan to harm herself or others.  We also discussed medication as an option if that something that she is on at all interested though I feel like a lot of this really is situational and being able to talk with someone will be most helpful in the short-term and long-term. ?

## 2021-12-25 DIAGNOSIS — K219 Gastro-esophageal reflux disease without esophagitis: Secondary | ICD-10-CM | POA: Diagnosis not present

## 2021-12-25 DIAGNOSIS — E269 Hyperaldosteronism, unspecified: Secondary | ICD-10-CM | POA: Diagnosis not present

## 2021-12-25 DIAGNOSIS — R9431 Abnormal electrocardiogram [ECG] [EKG]: Secondary | ICD-10-CM | POA: Diagnosis not present

## 2021-12-25 DIAGNOSIS — G35 Multiple sclerosis: Secondary | ICD-10-CM | POA: Diagnosis not present

## 2021-12-25 DIAGNOSIS — G4733 Obstructive sleep apnea (adult) (pediatric): Secondary | ICD-10-CM | POA: Diagnosis not present

## 2021-12-25 DIAGNOSIS — J4530 Mild persistent asthma, uncomplicated: Secondary | ICD-10-CM | POA: Diagnosis not present

## 2021-12-25 DIAGNOSIS — E119 Type 2 diabetes mellitus without complications: Secondary | ICD-10-CM | POA: Diagnosis not present

## 2021-12-25 DIAGNOSIS — I471 Supraventricular tachycardia: Secondary | ICD-10-CM | POA: Diagnosis not present

## 2021-12-25 DIAGNOSIS — Z9089 Acquired absence of other organs: Secondary | ICD-10-CM | POA: Diagnosis not present

## 2021-12-25 DIAGNOSIS — I10 Essential (primary) hypertension: Secondary | ICD-10-CM | POA: Diagnosis not present

## 2021-12-25 DIAGNOSIS — K5730 Diverticulosis of large intestine without perforation or abscess without bleeding: Secondary | ICD-10-CM | POA: Diagnosis not present

## 2021-12-25 DIAGNOSIS — E785 Hyperlipidemia, unspecified: Secondary | ICD-10-CM | POA: Diagnosis not present

## 2021-12-25 DIAGNOSIS — R1012 Left upper quadrant pain: Secondary | ICD-10-CM | POA: Diagnosis not present

## 2021-12-25 DIAGNOSIS — R569 Unspecified convulsions: Secondary | ICD-10-CM | POA: Diagnosis not present

## 2021-12-25 DIAGNOSIS — F419 Anxiety disorder, unspecified: Secondary | ICD-10-CM | POA: Diagnosis not present

## 2021-12-25 DIAGNOSIS — Z9049 Acquired absence of other specified parts of digestive tract: Secondary | ICD-10-CM | POA: Diagnosis not present

## 2021-12-25 DIAGNOSIS — I459 Conduction disorder, unspecified: Secondary | ICD-10-CM | POA: Diagnosis not present

## 2021-12-25 DIAGNOSIS — R079 Chest pain, unspecified: Secondary | ICD-10-CM | POA: Diagnosis not present

## 2021-12-25 DIAGNOSIS — R0789 Other chest pain: Secondary | ICD-10-CM | POA: Diagnosis not present

## 2021-12-26 ENCOUNTER — Other Ambulatory Visit: Payer: Self-pay | Admitting: *Deleted

## 2021-12-26 ENCOUNTER — Telehealth: Payer: Self-pay

## 2021-12-26 DIAGNOSIS — I10 Essential (primary) hypertension: Secondary | ICD-10-CM

## 2021-12-26 DIAGNOSIS — R0789 Other chest pain: Secondary | ICD-10-CM | POA: Diagnosis not present

## 2021-12-26 DIAGNOSIS — I517 Cardiomegaly: Secondary | ICD-10-CM | POA: Diagnosis not present

## 2021-12-26 DIAGNOSIS — I3481 Nonrheumatic mitral (valve) annulus calcification: Secondary | ICD-10-CM | POA: Diagnosis not present

## 2021-12-26 DIAGNOSIS — R079 Chest pain, unspecified: Secondary | ICD-10-CM | POA: Diagnosis not present

## 2021-12-26 NOTE — Chronic Care Management (AMB) (Signed)
  Care Management   Outreach Note  12/26/2021 Name: Jennifer [name] MRN: [id number] DOB: 08/03/1966  Referred by: [name], MD Reason for referral : Care Coordination (Outreach to schedule referral with Licensed Clinical SW and RNCM )   A second unsuccessful telephone outreach was attempted today. The patient was referred to the case management team for assistance with care management and care coordination.   Follow Up Plan:  A HIPAA compliant phone message was left for the patient providing contact information and requesting a return call.   [name], [place] Management  Direct Dial: [phone]

## 2021-12-26 NOTE — Telephone Encounter (Signed)
-----   Message from Althea Charon, [place] sent at 12/15/2021  1:04 PM EDT ----- ?Please refer to sleep specialist in [facility] area due to sleep apnea. ?

## 2021-12-26 NOTE — Telephone Encounter (Signed)
Referral has been sent to Fax# [phone]. Patient has been updated via mychart.  ?

## 2021-12-27 DIAGNOSIS — I10 Essential (primary) hypertension: Secondary | ICD-10-CM | POA: Diagnosis not present

## 2021-12-27 DIAGNOSIS — Z87891 Personal history of nicotine dependence: Secondary | ICD-10-CM | POA: Diagnosis not present

## 2021-12-27 DIAGNOSIS — R9431 Abnormal electrocardiogram [ECG] [EKG]: Secondary | ICD-10-CM | POA: Diagnosis not present

## 2021-12-28 ENCOUNTER — Other Ambulatory Visit: Payer: Self-pay

## 2021-12-28 DIAGNOSIS — K2270 Barrett's esophagus without dysplasia: Secondary | ICD-10-CM | POA: Diagnosis not present

## 2021-12-28 DIAGNOSIS — R079 Chest pain, unspecified: Secondary | ICD-10-CM | POA: Diagnosis not present

## 2021-12-28 DIAGNOSIS — F419 Anxiety disorder, unspecified: Secondary | ICD-10-CM | POA: Diagnosis not present

## 2021-12-28 DIAGNOSIS — I471 Supraventricular tachycardia: Secondary | ICD-10-CM | POA: Diagnosis not present

## 2021-12-28 DIAGNOSIS — I10 Essential (primary) hypertension: Secondary | ICD-10-CM | POA: Diagnosis not present

## 2021-12-28 DIAGNOSIS — R002 Palpitations: Secondary | ICD-10-CM | POA: Diagnosis not present

## 2021-12-28 MED ORDER — ALVESCO 80 MCG/ACT IN AERS
INHALATION_SPRAY | RESPIRATORY_TRACT | 2 refills | Status: DC
Start: 2021-12-28 — End: 2022-02-17

## 2021-12-29 ENCOUNTER — Ambulatory Visit (INDEPENDENT_AMBULATORY_CARE_PROVIDER_SITE_OTHER): Payer: Medicare HMO | Admitting: Internal Medicine

## 2021-12-29 ENCOUNTER — Encounter: Payer: Self-pay | Admitting: Internal Medicine

## 2021-12-29 VITALS — BP 150/88 | HR 92 | Temp 98.1°F | Resp 20 | Ht 62.0 in | Wt 233.4 lb

## 2021-12-29 DIAGNOSIS — K219 Gastro-esophageal reflux disease without esophagitis: Secondary | ICD-10-CM

## 2021-12-29 DIAGNOSIS — J4540 Moderate persistent asthma, uncomplicated: Secondary | ICD-10-CM

## 2021-12-29 DIAGNOSIS — J310 Chronic rhinitis: Secondary | ICD-10-CM | POA: Diagnosis not present

## 2021-12-29 NOTE — Patient Instructions (Addendum)
Asthma Continue Xopenex 2 puffs every 6 hours as needed for cough, wheeze,tightness in chest, or shortness of breath OR Xopenex 1.25 mg 1 unit dose via nebulizer every 4-6 hours as needed Continue to use aerobika valve 1-2 times a day for 5-10 minutes  For asthma flare, begin Alvesco  80 mcg 1 puff twice a day for 2 weeks or until cough and wheeze free. We will start you with a lower dose due to your reactions to inhalers in the past Keep your appointment with [facility] on 12/30/21 Continue your Aerobika device as prescribed by Pulmonary   Reflux Continue dietary lifestyle modifications as listed below Continue the regimen as prescribed by your gastroenterologist. It is important to follow-up for your EGD/colonoscopy  Chronic rhinitis Continue flunisolide nasal spray 2 sprays in each nostril once a day a day as needed for stuffy nose.  In the right nostril, point the applicator out toward the right ear. 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 Rowe Clack regarding these issues   Call the clinic if this treatment plan is not working well for you  Follow up in 3 months or sooner if needed.

## 2021-12-29 NOTE — Addendum Note (Signed)
Addended by: Berniece Andreas L on: 12/29/2021 05:00 PM   Modules accepted: Orders
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
## 2021-12-30 DIAGNOSIS — J45909 Unspecified asthma, uncomplicated: Secondary | ICD-10-CM | POA: Diagnosis not present

## 2021-12-30 DIAGNOSIS — J383 Other diseases of vocal cords: Secondary | ICD-10-CM | POA: Diagnosis not present

## 2021-12-30 DIAGNOSIS — G4733 Obstructive sleep apnea (adult) (pediatric): Secondary | ICD-10-CM | POA: Diagnosis not present

## 2021-12-30 DIAGNOSIS — K219 Gastro-esophageal reflux disease without esophagitis: Secondary | ICD-10-CM | POA: Diagnosis not present

## 2021-12-30 DIAGNOSIS — Z87891 Personal history of nicotine dependence: Secondary | ICD-10-CM | POA: Diagnosis not present

## 2021-12-30 DIAGNOSIS — F17211 Nicotine dependence, cigarettes, in remission: Secondary | ICD-10-CM | POA: Diagnosis not present

## 2021-12-30 DIAGNOSIS — Z6841 Body Mass Index (BMI) 40.0 and over, adult: Secondary | ICD-10-CM | POA: Diagnosis not present

## 2021-12-30 DIAGNOSIS — I10 Essential (primary) hypertension: Secondary | ICD-10-CM | POA: Diagnosis not present

## 2021-12-30 DIAGNOSIS — R9431 Abnormal electrocardiogram [ECG] [EKG]: Secondary | ICD-10-CM | POA: Diagnosis not present

## 2021-12-30 DIAGNOSIS — R002 Palpitations: Secondary | ICD-10-CM | POA: Diagnosis not present

## 2021-12-30 NOTE — Chronic Care Management (AMB) (Signed)
  Care Management   Outreach Note  12/30/2021 Name: Jennifer [name] MRN: [id number] DOB: 09-11-1965  Referred by: [name], MD Reason for referral : Care Coordination (Outreach to schedule referral with [facility] and [facility] )   Third unsuccessful telephone outreach was attempted today. The patient was referred to the case management team for assistance with care management and care coordination. The patient's primary care provider has been notified of our unsuccessful attempts to make or maintain contact with the patient. The care management team is pleased to engage with this patient at any time in the future should he/she be interested in assistance from the care management team.   Follow Up Plan:  We have been unable to make contact with the patient for follow up. The care management team is available to follow up with the patient after provider conversation with the patient regarding recommendation for care management engagement and subsequent re-referral to the care management team.   [name], [place] [facility]  Direct Dial: [phone]

## 2022-01-02 DIAGNOSIS — R9431 Abnormal electrocardiogram [ECG] [EKG]: Secondary | ICD-10-CM | POA: Diagnosis not present

## 2022-01-02 DIAGNOSIS — R0602 Shortness of breath: Secondary | ICD-10-CM | POA: Diagnosis not present

## 2022-01-02 DIAGNOSIS — S2231XA Fracture of one rib, right side, initial encounter for closed fracture: Secondary | ICD-10-CM | POA: Diagnosis not present

## 2022-01-02 DIAGNOSIS — R059 Cough, unspecified: Secondary | ICD-10-CM | POA: Diagnosis not present

## 2022-01-03 DIAGNOSIS — F458 Other somatoform disorders: Secondary | ICD-10-CM | POA: Diagnosis not present

## 2022-01-03 DIAGNOSIS — R0602 Shortness of breath: Secondary | ICD-10-CM | POA: Diagnosis not present

## 2022-01-03 DIAGNOSIS — F410 Panic disorder [episodic paroxysmal anxiety] without agoraphobia: Secondary | ICD-10-CM | POA: Diagnosis not present

## 2022-01-03 DIAGNOSIS — H524 Presbyopia: Secondary | ICD-10-CM | POA: Diagnosis not present

## 2022-01-03 DIAGNOSIS — R070 Pain in throat: Secondary | ICD-10-CM | POA: Diagnosis not present

## 2022-01-03 DIAGNOSIS — R1310 Dysphagia, unspecified: Secondary | ICD-10-CM | POA: Diagnosis not present

## 2022-01-03 DIAGNOSIS — R490 Dysphonia: Secondary | ICD-10-CM | POA: Diagnosis not present

## 2022-01-03 DIAGNOSIS — J310 Chronic rhinitis: Secondary | ICD-10-CM | POA: Diagnosis not present

## 2022-01-03 DIAGNOSIS — R0989 Other specified symptoms and signs involving the circulatory and respiratory systems: Secondary | ICD-10-CM | POA: Diagnosis not present

## 2022-01-03 DIAGNOSIS — R1319 Other dysphagia: Secondary | ICD-10-CM | POA: Diagnosis not present

## 2022-01-06 ENCOUNTER — Encounter: Payer: Self-pay | Admitting: Family Medicine

## 2022-01-06 ENCOUNTER — Ambulatory Visit (INDEPENDENT_AMBULATORY_CARE_PROVIDER_SITE_OTHER): Payer: Medicare HMO | Admitting: Family Medicine

## 2022-01-06 VITALS — BP 155/88 | HR 82 | Resp 18 | Ht 62.0 in | Wt 235.0 lb

## 2022-01-06 DIAGNOSIS — I10 Essential (primary) hypertension: Secondary | ICD-10-CM | POA: Diagnosis not present

## 2022-01-06 DIAGNOSIS — F418 Other specified anxiety disorders: Secondary | ICD-10-CM

## 2022-01-06 DIAGNOSIS — H938X2 Other specified disorders of left ear: Secondary | ICD-10-CM

## 2022-01-06 DIAGNOSIS — D510 Vitamin B12 deficiency anemia due to intrinsic factor deficiency: Secondary | ICD-10-CM

## 2022-01-06 DIAGNOSIS — H6992 Unspecified Eustachian tube disorder, left ear: Secondary | ICD-10-CM

## 2022-01-06 DIAGNOSIS — H6982 Other specified disorders of Eustachian tube, left ear: Secondary | ICD-10-CM

## 2022-01-06 MED ORDER — CYANOCOBALAMIN 1000 MCG/ML IJ SOLN
1000.0000 ug | Freq: Once | INTRAMUSCULAR | Status: AC
  Administered 2022-01-06: 1000 ug via INTRAMUSCULAR

## 2022-01-06 NOTE — Assessment & Plan Note (Signed)
Hypertension-discussed options.  Continue with metoprolol.  Discussed starting with half of a tab of spironolactone, 12.5 mg daily and monitoring blood pressures and writing them down in a log so that when she comes back in a couple of weeks we can take a look at the blood pressure log and mark which days she actually took both of her medications.  I think that can be helpful.  We did discuss that it will not control her blood pressure all of the time but were looking for an overall big picture of controlling the pressure to reduce the strain on her heart.

## 2022-01-06 NOTE — Progress Notes (Signed)
Established Patient Office Visit  Subjective   Patient ID: Jennifer [name], female    DOB: 1966-06-26  Age: 56 y.o. MRN: [id number]  Chief Complaint  Patient presents with   Hospital Follow up     Patient would like to discuss Echo results     HPI  She does have some concerns today.  She went to the emergency department and blood pressure was very elevated.  They gave her hydralazine at several doses and really could not get her blood pressure back down. She did have a follow-up echocardiogram and they did note that she has some asymmetric hypertrophy of the left ventricle.  Echocardiogram was performed at [facility].  EF of 60 to 65%.  She is aware that that hypertrophy is consistent with having uncontrolled hypertension.  She does take metoprolol regularly.  She still dealing with a lot of home social stressors and really wants to move from her current apartment complex.  Please see previous notes.  She still has not had any further contact with her granddaughter which has been incredibly upsetting.  She has not been able to engage in therapy or counseling yet.  She has not tried reaching out to her insurance to see if they will cover any of the telephone therapy sessions.  She also reports that her left ear has been bothering her and would like to take a look at today.  She feels like it has been popping and having a crawling sensation.  No drainage or significant pain.  She says it always seems to be her left ear that gets irritated.  She does use a nasal steroid spray.  No current antihistamines.      ROS    Objective:     BP (!) 155/88   Pulse 82   Resp 18   Ht '5\' 2"'$  (1.575 m)   Wt 235 lb (106.6 kg)   LMP 06/25/2013   SpO2 95%   BMI 42.98 kg/m     Physical Exam Constitutional:      Appearance: She is well-developed.  HENT:     Head: Normocephalic and atraumatic.     Right Ear: Tympanic membrane, ear canal and external ear normal.     Left Ear: Tympanic  membrane, ear canal and external ear normal.     Nose: Nose normal.  Eyes:     Conjunctiva/sclera: Conjunctivae normal.     Pupils: Pupils are equal, round, and reactive to light.  Neck:     Thyroid: No thyromegaly.  Cardiovascular:     Rate and Rhythm: Normal rate and regular rhythm.     Heart sounds: Normal heart sounds.  Pulmonary:     Effort: Pulmonary effort is normal.     Breath sounds: Normal breath sounds. No wheezing.  Musculoskeletal:     Cervical back: Neck supple.  Lymphadenopathy:     Cervical: No cervical adenopathy.  Skin:    General: Skin is warm and dry.  Neurological:     Mental Status: She is alert and oriented to person, place, and time.     No results found for any visits on 01/06/22.     The ASCVD Risk score (Arnett DK, et al., 2019) failed to calculate for the following reasons:   The patient has a prior MI or stroke diagnosis    Assessment & Plan:   Problem List Items Addressed This Visit       Cardiovascular and Mediastinum   Hypertension with intolerance to multiple  antihypertensive drugs    Hypertension-discussed options.  Continue with metoprolol.  Discussed starting with half of a tab of spironolactone, 12.5 mg daily and monitoring blood pressures and writing them down in a log so that when she comes back in a couple of weeks we can take a look at the blood pressure log and mark which days she actually took both of her medications.  I think that can be helpful.  We did discuss that it will not control her blood pressure all of the time but were looking for an overall big picture of controlling the pressure to reduce the strain on her heart.         Other   Pernicious anemia - Primary   Depression with anxiety    Still really struggling.  Having moments of not wanting to be here.  She said that she did get a call back from the social worker at the hospital also plans on try to connect with her.  Again I think she should still try to connect  with a talk therapy that might be covered through her insurance such as talk space etc.  We had actually placed a referral for her when she was last here but they do not have any new patient appointments right now as they are short staffed and trying to hire.       Other Visit Diagnoses     Ear popping, left       Dysfunction of left eustachian tube           Eustachian tube dysfunction, left-tympanometry shows positive peak pressure.  I do not see any sign of effusion on exam.  Continue with nasal steroid spray.  Sometimes we could offer decongestants but with her blood pressure being elevated that is not a great choice right now.  We also discussed that if it does not improve over the next week or 2 then we could consider a course of a steroid.  But she historically has not done well with steroids.  Plan to recheck here in 2 weeks.  No follow-ups on file.   I spent 45 minutes on the day of the encounter to include pre-visit record review, face-to-face time with the patient and post visit ordering of test.   Beatrice Lecher, MD

## 2022-01-06 NOTE — Assessment & Plan Note (Signed)
Still really struggling.  Having moments of not wanting to be here.  She said that she did get a call back from the social worker at the hospital also plans on try to connect with her.  Again I think she should still try to connect with a talk therapy that might be covered through her insurance such as talk space etc.  We had actually placed a referral for her when she was last here but they do not have any new patient appointments right now as they are short staffed and trying to hire.

## 2022-01-10 DIAGNOSIS — Z90710 Acquired absence of both cervix and uterus: Secondary | ICD-10-CM | POA: Diagnosis not present

## 2022-01-10 DIAGNOSIS — R0789 Other chest pain: Secondary | ICD-10-CM | POA: Diagnosis not present

## 2022-01-10 DIAGNOSIS — C55 Malignant neoplasm of uterus, part unspecified: Secondary | ICD-10-CM | POA: Diagnosis not present

## 2022-01-10 DIAGNOSIS — R52 Pain, unspecified: Secondary | ICD-10-CM | POA: Diagnosis not present

## 2022-01-10 DIAGNOSIS — E611 Iron deficiency: Secondary | ICD-10-CM | POA: Diagnosis not present

## 2022-01-10 DIAGNOSIS — R079 Chest pain, unspecified: Secondary | ICD-10-CM | POA: Diagnosis not present

## 2022-01-10 DIAGNOSIS — R000 Tachycardia, unspecified: Secondary | ICD-10-CM | POA: Diagnosis not present

## 2022-01-10 DIAGNOSIS — Z8542 Personal history of malignant neoplasm of other parts of uterus: Secondary | ICD-10-CM | POA: Diagnosis not present

## 2022-01-10 DIAGNOSIS — D500 Iron deficiency anemia secondary to blood loss (chronic): Secondary | ICD-10-CM | POA: Diagnosis not present

## 2022-01-10 NOTE — Progress Notes (Unsigned)
[place] [address] Dept: [phone]  FOLLOW UP NOTE  Patient ID: Jennifer [name], female    DOB: 05-06-1966  Age: 56 y.o. MRN: [id number] Date of Office Visit: 01/11/2022  Assessment  Chief Complaint: No chief complaint on file.  HPI Jennifer [name] is a 56 year old female who presents the clinic for follow-up visit.  She was last seen in this clinic on 05/01/2022 by Dr. Simona Huh and vocal cord dysfunction.   Drug Allergies:  Allergies  Allergen Reactions   Azithromycin Shortness Of Breath    Lip swelling, SOB.      Ciprofloxacin Swelling    REACTION: tongue swells   Codeine Shortness Of Breath   Erythromycin Base Itching and Rash   Sulfa Antibiotics Shortness Of Breath, Rash and Other (See Comments)   Sulfasalazine Rash and Shortness Of Breath    Other reaction(s): Other (See Comments) Other reaction(s): SHORTNESS OF BREATH   Telmisartan Swelling    Tongue swelling, Micardis   Ace Inhibitors Cough   Aspirin Hives and Other (See Comments)    flushing   Atenolol Other (See Comments)    Squeezing chest sensation   Avelox [Moxifloxacin Hcl In Nacl] Itching        Beta Adrenergic Blockers Other (See Comments)    Feels like chest tightening labetalol, bystolic  Feels like chest tightening "Metoprolol"    Buspar [Buspirone] Other (See Comments)    Light headed   Butorphanol Tartrate Other (See Comments)    Patient aggitated   Cetirizine Hives and Rash        Clonidine Hcl     REACTION: makes blood pressure high   Cortisone     Feels like she is going crazy   Erythromycin Rash   Fentanyl Other (See Comments)    aggressive    Fluoxetine Hcl Other (See Comments)    REACTION: headaches   Ketorolac Tromethamine     jittery   Lidocaine Other (See Comments)    When it involves the throat,    Lisinopril Cough   Metoclopramide Hcl Other (See Comments)    Dystonic reaction   Midazolam Other (See Comments)    agitation Slow to wake up    Montelukast Other (See Comments)    Singulair   Montelukast Sodium Other (See Comments)    DOES NOT REMEMBER  Don't remember-told not to take   Naproxen Other (See Comments)    FLUSHING Pt states she took Ibuprofen today (10/08/19)   Paroxetine Other (See Comments)    REACTION: headaches   Penicillins Rash   Pravastatin Other (See Comments)    Myalgias   Promethazine Other (See Comments)    Dystonic reaction   Promethazine Hcl Other (See Comments)    jittery   Quinolones Swelling and Rash   Serotonin Reuptake Inhibitors (Ssris) Other (See Comments)    Headache Effexor, prozac, zoloft,    Sertraline Hcl     REACTION: headaches   Stelazine [Trifluoperazine] Other (See Comments)    Dystonic reaction   Tobramycin Itching and Rash   Trifluoperazine Hcl     dystonic   Atrovent Nasal Spray [Ipratropium]     Tachycardia and shaking   Diltiazem Other (See Comments)    Chest pain   Iodinated Contrast Media     Other reaction(s): Other (See Comments) Chest heaviness/sob   Polyethylene Glycol 3350     Other reaction(s): Laryngeal Edema (ALLERGY)   Propoxyphene    Adhesive [Tape] Rash    EKG  monitor patches, some tapes Blisters, rash, itching, welts.   Butorphanol Anxiety    Patient agitated   Ceftriaxone Rash    rocephin   Iron Rash    Flushing with certain IV types   Metoclopramide Itching and Other (See Comments)    Dystonic reaction   Metronidazole Rash   Other Rash and Other (See Comments)    Uncoded Allergy. Allergen: steriods, Other Reaction: Not Assessed Other reaction(s): Flushing (ALLERGY/intolerance), GI Upset (intolerance), Hypertension (intolerance), Increased Heart Rate (intolerance), Mental Status Changes (intolerance), Other (See Comments), Tachycardia / Palpitations  (intolerance) Hospital gowns leave a rash.    Prednisone Anxiety and Palpitations   Prochlorperazine Anxiety    Compazine:  Dystonic reaction   Venlafaxine Anxiety   Zyrtec [Cetirizine Hcl]  Rash    All over body    Physical Exam: LMP 06/25/2013    Physical Exam  Diagnostics:    Assessment and Plan: No diagnosis found.  No orders of the defined types were placed in this encounter.   There are no Patient Instructions on file for this visit.  No follow-ups on file.    Thank you for the opportunity to care for this patient.  Please do not hesitate to contact me with questions.  Gareth Morgan, FNP [facility]

## 2022-01-10 NOTE — Patient Instructions (Incomplete)
Asthma Continue Xopenex 2 puffs every 6 hours as needed for cough, wheeze,tightness in chest, or shortness of breath OR Xopenex 1.25 mg 1 unit dose via nebulizer every 4-6 hours as needed For asthma flare, begin Alvesco  80 mcg 1 puff twice a day for 2 weeks or until cough and wheeze free. We will start you with a lower dose due to your reactions to inhalers in the past Continue your Aerobika device as prescribed by Pulmonary  Continue to follow up with your pulmonary team  Reflux Continue dietary lifestyle modifications as listed below Continue the regimen as prescribed by your gastroenterologist. It is important to keep your follow-up appointment for your EGD/colonoscopy  Chronic rhinitis Continue flunisolide nasal spray 2 sprays in each nostril once a day a day as needed for stuffy nose.  In the right nostril, point the applicator out toward the right ear. In the left nostril, point the applicator out toward the left ear Continue to perform nasal saline rinses 1-3 times a day as needed to help clean sinus tract - NeilMed sinus rinses DO NOT USE TAP WATER, can use distilled water or tap water that you boil for several minutes and then let cool prior to use Can use salt water gargles to help with phlegm in throat  Muscle Tension Dysphonia and suspected vocal cord spasm  These symptoms can worsen if drainage and reflux are uncontrolled so it is important to keep these symptoms at [place] to follow-up with Dr. Rowe Clack for further treatment of these issues  Call the clinic if this treatment plan is not working well for you  Follow up in 3 months or sooner if needed.

## 2022-01-11 ENCOUNTER — Encounter: Payer: Self-pay | Admitting: Family Medicine

## 2022-01-11 ENCOUNTER — Ambulatory Visit (INDEPENDENT_AMBULATORY_CARE_PROVIDER_SITE_OTHER): Payer: Medicare HMO | Admitting: Family Medicine

## 2022-01-11 VITALS — BP 146/96 | HR 102 | Temp 98.0°F | Resp 16

## 2022-01-11 DIAGNOSIS — K219 Gastro-esophageal reflux disease without esophagitis: Secondary | ICD-10-CM

## 2022-01-11 DIAGNOSIS — J383 Other diseases of vocal cords: Secondary | ICD-10-CM | POA: Diagnosis not present

## 2022-01-11 DIAGNOSIS — J310 Chronic rhinitis: Secondary | ICD-10-CM | POA: Diagnosis not present

## 2022-01-11 DIAGNOSIS — R9431 Abnormal electrocardiogram [ECG] [EKG]: Secondary | ICD-10-CM | POA: Diagnosis not present

## 2022-01-11 DIAGNOSIS — J4540 Moderate persistent asthma, uncomplicated: Secondary | ICD-10-CM

## 2022-01-11 NOTE — Addendum Note (Signed)
Addended by: Felipa Emory on: 01/11/2022 04:45 PM   Modules accepted: Orders

## 2022-01-12 DIAGNOSIS — R0602 Shortness of breath: Secondary | ICD-10-CM | POA: Diagnosis not present

## 2022-01-12 DIAGNOSIS — Z886 Allergy status to analgesic agent status: Secondary | ICD-10-CM | POA: Diagnosis not present

## 2022-01-12 DIAGNOSIS — R079 Chest pain, unspecified: Secondary | ICD-10-CM | POA: Diagnosis not present

## 2022-01-12 DIAGNOSIS — Z880 Allergy status to penicillin: Secondary | ICD-10-CM | POA: Diagnosis not present

## 2022-01-12 DIAGNOSIS — R072 Precordial pain: Secondary | ICD-10-CM | POA: Diagnosis not present

## 2022-01-12 DIAGNOSIS — Z888 Allergy status to other drugs, medicaments and biological substances status: Secondary | ICD-10-CM | POA: Diagnosis not present

## 2022-01-12 DIAGNOSIS — Z87891 Personal history of nicotine dependence: Secondary | ICD-10-CM | POA: Diagnosis not present

## 2022-01-12 DIAGNOSIS — R0789 Other chest pain: Secondary | ICD-10-CM | POA: Diagnosis not present

## 2022-01-12 DIAGNOSIS — Z881 Allergy status to other antibiotic agents status: Secondary | ICD-10-CM | POA: Diagnosis not present

## 2022-01-12 DIAGNOSIS — L259 Unspecified contact dermatitis, unspecified cause: Secondary | ICD-10-CM | POA: Diagnosis not present

## 2022-01-13 DIAGNOSIS — I517 Cardiomegaly: Secondary | ICD-10-CM | POA: Diagnosis not present

## 2022-01-13 DIAGNOSIS — H52209 Unspecified astigmatism, unspecified eye: Secondary | ICD-10-CM | POA: Diagnosis not present

## 2022-01-13 DIAGNOSIS — Z87891 Personal history of nicotine dependence: Secondary | ICD-10-CM | POA: Diagnosis not present

## 2022-01-13 DIAGNOSIS — H524 Presbyopia: Secondary | ICD-10-CM | POA: Diagnosis not present

## 2022-01-13 DIAGNOSIS — M549 Dorsalgia, unspecified: Secondary | ICD-10-CM | POA: Diagnosis not present

## 2022-01-13 DIAGNOSIS — R0602 Shortness of breath: Secondary | ICD-10-CM | POA: Diagnosis not present

## 2022-01-13 DIAGNOSIS — H5203 Hypermetropia, bilateral: Secondary | ICD-10-CM | POA: Diagnosis not present

## 2022-01-13 DIAGNOSIS — I10 Essential (primary) hypertension: Secondary | ICD-10-CM | POA: Diagnosis not present

## 2022-01-13 DIAGNOSIS — S2231XA Fracture of one rib, right side, initial encounter for closed fracture: Secondary | ICD-10-CM | POA: Diagnosis not present

## 2022-01-13 DIAGNOSIS — R059 Cough, unspecified: Secondary | ICD-10-CM | POA: Diagnosis not present

## 2022-01-15 DIAGNOSIS — R0602 Shortness of breath: Secondary | ICD-10-CM | POA: Diagnosis not present

## 2022-01-16 DIAGNOSIS — J4521 Mild intermittent asthma with (acute) exacerbation: Secondary | ICD-10-CM | POA: Diagnosis not present

## 2022-01-16 DIAGNOSIS — Z87891 Personal history of nicotine dependence: Secondary | ICD-10-CM | POA: Diagnosis not present

## 2022-01-16 DIAGNOSIS — J209 Acute bronchitis, unspecified: Secondary | ICD-10-CM | POA: Diagnosis not present

## 2022-01-17 DIAGNOSIS — R1313 Dysphagia, pharyngeal phase: Secondary | ICD-10-CM | POA: Diagnosis not present

## 2022-01-18 ENCOUNTER — Other Ambulatory Visit: Payer: Self-pay

## 2022-01-19 ENCOUNTER — Emergency Department (HOSPITAL_BASED_OUTPATIENT_CLINIC_OR_DEPARTMENT_OTHER)
Admission: EM | Admit: 2022-01-19 | Discharge: 2022-01-19 | Disposition: A | Discharge status: Home / Self Care | Payer: Medicare HMO | Attending: Emergency Medicine | Admitting: Emergency Medicine

## 2022-01-19 ENCOUNTER — Other Ambulatory Visit: Payer: Self-pay

## 2022-01-19 DIAGNOSIS — R61 Generalized hyperhidrosis: Secondary | ICD-10-CM | POA: Insufficient documentation

## 2022-01-19 DIAGNOSIS — R319 Hematuria, unspecified: Secondary | ICD-10-CM | POA: Diagnosis not present

## 2022-01-19 DIAGNOSIS — R0602 Shortness of breath: Secondary | ICD-10-CM | POA: Insufficient documentation

## 2022-01-19 DIAGNOSIS — R079 Chest pain, unspecified: Secondary | ICD-10-CM | POA: Diagnosis not present

## 2022-01-19 DIAGNOSIS — R457 State of emotional shock and stress, unspecified: Secondary | ICD-10-CM | POA: Diagnosis not present

## 2022-01-19 DIAGNOSIS — R52 Pain, unspecified: Secondary | ICD-10-CM | POA: Diagnosis not present

## 2022-01-19 DIAGNOSIS — R109 Unspecified abdominal pain: Secondary | ICD-10-CM | POA: Insufficient documentation

## 2022-01-19 DIAGNOSIS — R110 Nausea: Secondary | ICD-10-CM | POA: Insufficient documentation

## 2022-01-19 DIAGNOSIS — M545 Low back pain, unspecified: Secondary | ICD-10-CM | POA: Insufficient documentation

## 2022-01-19 DIAGNOSIS — I10 Essential (primary) hypertension: Secondary | ICD-10-CM | POA: Diagnosis not present

## 2022-01-19 LAB — CBC WITH DIFFERENTIAL/PLATELET
Abs Immature Granulocytes: 0.05 10*3/uL (ref 0.00–0.07)
Basophils Absolute: 0.1 10*3/uL (ref 0.0–0.1)
Basophils Relative: 1 %
Eosinophils Absolute: 0.2 10*3/uL (ref 0.0–0.5)
Eosinophils Relative: 3 %
HCT: 43.6 % (ref 36.0–46.0)
Hemoglobin: 14.6 g/dL (ref 12.0–15.0)
Immature Granulocytes: 1 %
Lymphocytes Relative: 21 %
Lymphs Abs: 1.3 10*3/uL (ref 0.7–4.0)
MCH: 29.9 pg (ref 26.0–34.0)
MCHC: 33.5 g/dL (ref 30.0–36.0)
MCV: 89.2 fL (ref 80.0–100.0)
Monocytes Absolute: 0.5 10*3/uL (ref 0.1–1.0)
Monocytes Relative: 7 %
Neutro Abs: 4.3 10*3/uL (ref 1.7–7.7)
Neutrophils Relative %: 67 %
Platelets: 193 10*3/uL (ref 150–400)
RBC: 4.89 MIL/uL (ref 3.87–5.11)
RDW: 13.5 % (ref 11.5–15.5)
WBC: 6.3 10*3/uL (ref 4.0–10.5)
nRBC: 0 % (ref 0.0–0.2)

## 2022-01-19 LAB — URINALYSIS, ROUTINE W REFLEX MICROSCOPIC
Bilirubin Urine: NEGATIVE
Glucose, UA: NEGATIVE mg/dL
Hgb urine dipstick: NEGATIVE
Ketones, ur: NEGATIVE mg/dL
Leukocytes,Ua: NEGATIVE
Nitrite: NEGATIVE
Protein, ur: NEGATIVE mg/dL
Specific Gravity, Urine: 1.02 (ref 1.005–1.030)
pH: 6.5 (ref 5.0–8.0)

## 2022-01-19 LAB — COMPREHENSIVE METABOLIC PANEL
ALT: 27 U/L (ref 0–44)
AST: 20 U/L (ref 15–41)
Albumin: 3.9 g/dL (ref 3.5–5.0)
Alkaline Phosphatase: 71 U/L (ref 38–126)
Anion gap: 8 (ref 5–15)
BUN: 13 mg/dL (ref 6–20)
CO2: 25 mmol/L (ref 22–32)
Calcium: 8.9 mg/dL (ref 8.9–10.3)
Chloride: 108 mmol/L (ref 98–111)
Creatinine, Ser: 0.67 mg/dL (ref 0.44–1.00)
GFR, Estimated: >60 mL/min (ref >60)
Glucose, Bld: 103 mg/dL — ABNORMAL HIGH (ref 70–99)
Potassium: 3.5 mmol/L (ref 3.5–5.1)
Sodium: 141 mmol/L (ref 135–145)
Total Bilirubin: 0.6 mg/dL (ref 0.3–1.2)
Total Protein: 7.5 g/dL (ref 6.5–8.1)

## 2022-01-19 LAB — TROPONIN I (HIGH SENSITIVITY): Troponin I (High Sensitivity): 4 ng/L (ref <18)

## 2022-01-19 LAB — LIPASE, BLOOD: Lipase: 38 U/L (ref 11–51)

## 2022-01-19 NOTE — ED Provider Notes (Signed)
[facility] EMERGENCY DEPARTMENT Provider Note   CSN: [id number] Arrival date & time: 01/19/22  1149     History  Chief Complaint  Patient presents with   Back Pain   Excessive Sweating    Jennifer [name] is a 56 y.o. female with a history of diverticulitis, presented to ED via EMS with several complaints.  The patient reports that she woke up with pain in her right lower flank earlier today which was like a dull throb that she has not felt before, seems to radiate around towards the front of the abdomen.  She said that she abruptly began to feel nauseated and "broke out into a sweat" and said that she was sweating profusely, which she never typically does.  This was inside her house.  She took a cold shower but was still sweating afterwards, today she felt unwell, and that her family member recommended to call 911.  Since arriving in the ER she continues to feel nauseated and has the same pain in her right lower flank.  She reports sometimes she has hematuria, but this is an ongoing issue, she is not aware of passing kidney stones in the past.  Per medical record review the patient was diagnosed with acute sigmoid diverticulitis in March 2023 on CT scan at the time.  Per my review of the records, she is also been evaluated extensively in the emergency department, including at [place], for variety of complaints which include shortness of breath and chest pain.  Most recently, 6 days ago she was seen in the emergency department, had unremarkable troponins and a negative D-dimer per my review of care everywhere.  Her CBC was largely unremarkable, and BMP also essentially within normal limits.  HPI     Home Medications Prior to Admission medications   Medication Sig Start Date End Date Taking? Authorizing Provider  ACCU-CHEK GUIDE test strip For testing blood sugars dailyDx:r73.03 06/17/21   [name], MD  bisacodyl (DULCOLAX) 5 MG EC tablet Take by  mouth.    [provider]  ciclesonide (ALVESCO) 80 MCG/ACT inhaler 1 puff twice a day for 2 weeks or until cough and wheeze free. Patient not taking: Reported on 01/11/2022 12/28/21   Althea Charon, FNP  Docusate Sodium (DSS) 100 MG CAPS Take by mouth.    [provider]  EPINEPHrine 0.3 mg/0.3 mL IJ SOAJ injection Use as directed for severe allergic reactions 11/10/21   Althea Charon, FNP  famotidine (PEPCID) 20 MG tablet Take by mouth. 05/06/21   [provider]  Fluticasone Furoate (ARNUITY ELLIPTA) 100 MCG/ACT AEPB  12/05/21   [provider]  levalbuterol (XOPENEX HFA) 45 MCG/ACT inhaler INHALE 2 PUFFS INTO THE LUNGS EVERY 6 HOURS AS NEEDED FOR WHEEZING 11/10/21   Althea Charon, FNP  levalbuterol Penne Lash) 1.25 MG/3ML nebulizer solution Take 1.25 mg by nebulization every 3 (three) hours as needed for wheezing. 09/28/20   Valentina Shaggy, MD  lisinopril (ZESTRIL) 5 MG tablet Take 1 tablet (5 mg total) by mouth daily. 11/24/21   [name], MD  metoprolol tartrate (LOPRESSOR) 50 MG tablet Take 1 tablet (50 mg total) by mouth 2 (two) times daily. 11/24/21   [name], MD  nystatin (MYCOSTATIN) 100000 UNIT/ML suspension Take 5 ml 4 times a day for 1 week for symptoms of thrush. 12/01/21   Ambs, Kathrine Cords, FNP  ondansetron (ZOFRAN-ODT) 4 MG disintegrating tablet Take 1 tablet (4 mg total) by mouth every  8 (eight) hours as needed for nausea or vomiting. 12/06/21   Tegeler, Gwenyth Allegra, MD  potassium chloride (KLOR-CON) 20 MEQ packet Take 20 mEq by mouth daily. 09/09/21   [name], MD  sucralfate (CARAFATE) 1 GM/10ML suspension Take 10 mLs (1 g total) by mouth 4 (four) times daily -  with meals and at bedtime. 11/03/21   [name], MD      Allergies    Azithromycin, Ciprofloxacin, Codeine, Erythromycin base, Sulfa antibiotics, Sulfasalazine, Telmisartan, Ace inhibitors, Aspirin, Atenolol, Avelox [moxifloxacin hcl in  nacl], Beta adrenergic blockers, Buspar [buspirone], Butorphanol tartrate, Cetirizine, Clonidine hcl, Cortisone, Erythromycin, Fentanyl, Fluoxetine hcl, Ketorolac tromethamine, Lidocaine, Lisinopril, Metoclopramide hcl, Midazolam, Montelukast, Montelukast sodium, Naproxen, Paroxetine, Penicillins, Pravastatin, Promethazine, Promethazine hcl, Quinolones, Serotonin reuptake inhibitors (ssris), Sertraline hcl, Stelazine [trifluoperazine], Tobramycin, Trifluoperazine hcl, Atrovent nasal spray [ipratropium], Diltiazem, Iodinated contrast media, Polyethylene glycol 3350, Propoxyphene, Adhesive [tape], Butorphanol, Ceftriaxone, Iron, Metoclopramide, Metronidazole, Other, Prednisone, Prochlorperazine, Venlafaxine, and Zyrtec [cetirizine hcl]    Review of Systems   Review of Systems  Physical Exam Updated Vital Signs BP (!) 154/83   Pulse 82   Temp 98.1 F (36.7 C) (Oral)   Resp (!) 23   Ht '5\' 2"'$  (1.575 m)   Wt 105.7 kg   LMP 06/25/2013   SpO2 96%   BMI 42.62 kg/m  Physical Exam Constitutional:      General: She is not in acute distress.    Appearance: She is obese.  HENT:     Head: Normocephalic and atraumatic.  Eyes:     Conjunctiva/sclera: Conjunctivae normal.     Pupils: Pupils are equal, round, and reactive to light.  Cardiovascular:     Rate and Rhythm: Normal rate and regular rhythm.  Pulmonary:     Effort: Pulmonary effort is normal. No respiratory distress.  Abdominal:     General: There is no distension.     Tenderness: There is right CVA tenderness.  Skin:    General: Skin is warm and dry.  Neurological:     General: No focal deficit present.     Mental Status: She is alert. Mental status is at baseline.  Psychiatric:        Mood and Affect: Mood normal.        Behavior: Behavior normal.     ED Results / Procedures / Treatments   Labs (all labs ordered are listed, but only abnormal results are displayed) Labs Reviewed  COMPREHENSIVE METABOLIC PANEL - Abnormal;  Notable for the following components:      Result Value   Glucose, Bld 103 (*)    All other components within normal limits  CBC WITH DIFFERENTIAL/PLATELET  LIPASE, BLOOD  URINALYSIS, ROUTINE W REFLEX MICROSCOPIC  TROPONIN I (HIGH SENSITIVITY)    EKG EKG Interpretation  Date/Time:  Thursday January 19 2022 12:40:14 EDT Ventricular Rate:  69 PR Interval:  111 QRS Duration: 94 QT Interval:  386 QTC Calculation: 414 R Axis:   38 Text Interpretation: Sinus rhythm Borderline short PR interval Confirmed by Octaviano Glow [phone]) on 01/19/2022 1:30:51 PM  Radiology No results found.  Procedures Procedures    Medications Ordered in ED Medications - No data to display  ED Course/ Medical Decision Making/ A&P Clinical Course as of 01/19/22 1453  Thu Jan 19, 2022  1444 Patient remains mildly hypertensive, otherwise unremarkable vital signs to the ED.  She has not been diaphoretic during her stay in the ED.  She has a PCP follow-up tomorrow, which I think  is reasonable.  With unremarkable blood test, I do not see evidence of a urine infection, kidney stone, ACS, or other infection on her blood work.  Okay for discharge [MT]    Clinical Course User Index [MT] Aztlan Coll, Carola Rhine, MD                           Medical Decision Making Amount and/or Complexity of Data Reviewed Labs: ordered. ECG/medicine tests: ordered.   This patient presents to the ED with concern for generalized weakness, flank pain, sweat. This involves an extensive number of treatment options, and is a complaint that carries with it a high risk of complications and morbidity.  The differential diagnosis includes atypical ACS versus ureteral colic versus kidney stone versus diverticulitis versus other Additional history obtained from EMS  External records from outside source obtained and reviewed including hospital records as noted above, care everywhere records  I ordered and personally interpreted labs.  The  pertinent results include: Blood test unremarkable, no leukocytosis, no acute anemia, troponin normal  She has mild chronic dyspnea, I do not see any indication for emergent CT imaging of the chest at this time.  Low suspicion for PE or pneumonia clinically.  She does not present with SIRS criteria.  Do not believe this is sepsis.  The patient was maintained on a cardiac monitor.  I personally viewed and interpreted the cardiac monitored which showed an underlying rhythm of: Normal sinus rhythm  Per my interpretation the patient's ECG shows normal sinus rhythm no acute ischemic finding  I have reviewed the patients home medicines and have made adjustments as needed  After the interventions noted above, I reevaluated the patient and found that they have: stayed the same  Dispostion:  After consideration of the diagnostic results and the patients response to treatment, I feel that the patent would benefit from close outpatient PCP follow-up.         Final Clinical Impression(s) / ED Diagnoses Final diagnoses:  Low back pain, unspecified back pain laterality, unspecified chronicity, unspecified whether sciatica present  Diaphoresis    Rx / DC Orders ED Discharge Orders     None         Wyvonnia Dusky, MD 01/19/22 1453

## 2022-01-19 NOTE — Discharge Instructions (Signed)
Please follow-up with your primary care doctor tomorrow for your regular appointment as scheduled.

## 2022-01-19 NOTE — ED Notes (Signed)
Charge RN and MD notified X2 attempt on unsuccessful IV. Vein was accessed and blood was drawing back when patient jerked away and screamed "you are hurting me". IV access was lost at this time.

## 2022-01-19 NOTE — ED Triage Notes (Signed)
Patient states she started having back pain today - then later she started sweating profusely and called 911.  Patient states "I just feel kinda funny" denies pack pain at this time.

## 2022-01-20 ENCOUNTER — Encounter: Payer: Self-pay | Admitting: Family Medicine

## 2022-01-20 ENCOUNTER — Ambulatory Visit (INDEPENDENT_AMBULATORY_CARE_PROVIDER_SITE_OTHER): Payer: Medicare HMO | Admitting: Family Medicine

## 2022-01-20 VITALS — BP 171/99 | HR 94 | Ht 62.0 in | Wt 237.0 lb

## 2022-01-20 DIAGNOSIS — F418 Other specified anxiety disorders: Secondary | ICD-10-CM

## 2022-01-20 DIAGNOSIS — R0789 Other chest pain: Secondary | ICD-10-CM | POA: Diagnosis not present

## 2022-01-20 DIAGNOSIS — I10 Essential (primary) hypertension: Secondary | ICD-10-CM

## 2022-01-20 DIAGNOSIS — H6122 Impacted cerumen, left ear: Secondary | ICD-10-CM

## 2022-01-20 DIAGNOSIS — L301 Dyshidrosis [pompholyx]: Secondary | ICD-10-CM

## 2022-01-20 DIAGNOSIS — E875 Hyperkalemia: Secondary | ICD-10-CM

## 2022-01-20 MED ORDER — CLOTRIMAZOLE-BETAMETHASONE 1-0.05 % EX CREA: 1.0000 "application " | TOPICAL_CREAM | Freq: Two times a day (BID) | CUTANEOUS | 1 refills | Status: DC

## 2022-01-20 NOTE — Assessment & Plan Note (Signed)
Still struggling emotionally with not being able to see her granddaughter.  But the neighbors that were causing a lot of issues have moved away.

## 2022-01-20 NOTE — Assessment & Plan Note (Signed)
Not at goal today.  She does feel little volume overloaded so discussed taking her spironolactone.  She is going to go for labs next week.

## 2022-01-20 NOTE — Progress Notes (Signed)
Acute Office Visit  Subjective:     Patient ID: Jennifer [name], female    DOB: May 08, 1966, 56 y.o.   MRN: [id number]  Chief Complaint  Patient presents with   Chest Pain    HPI Patient is in today for a couple of concerns.  The skin on her feet has been peeling a lot more and occasionally looks red and irritated.  She has been tested multiple times for tinea and it always seems to come back negative.  More recently she was soaking them and using tea tree oil and that was helping but now it starts to look inflamed again.  She says its not painful or itchy or irritated.  He also wants me to look in her left ear today she has been having some intermittent discomfort and pain.  She would really like to have another EKG done today.  She has been having some chest pain in fact she said she woke up last night feeling a sudden pressure in her chest.  It is happened a couple of times.  And last night she had an episode of feeling dizzy and profusely sweating which is very abnormal for her and went to the emergency department for further work-up.  Still feeling intermittently shortness of breath.  She is also noticed a little bit of swelling in her ankles over the last couple days with the left being a little worse than the right.  Reports feeling nauseated for greater than a week.  She says the most is a feeling of being overly full.  ROS      Objective:    BP (!) 171/99   Pulse 94   Ht '5\' 2"'$  (1.575 m)   Wt 237 lb (107.5 kg)   LMP 06/25/2013   SpO2 96%   BMI 43.35 kg/m    Physical Exam Constitutional:      Appearance: She is well-developed.  HENT:     Head: Normocephalic and atraumatic.     Right Ear: Tympanic membrane and external ear normal.     Left Ear: Tympanic membrane and external ear normal.     Ears:     Comments: Both canals had a fair amount of cerumen I was able to see a small area of the TMs bilaterally and that did appear to be normal.    Nose: Nose normal.   Eyes:     Conjunctiva/sclera: Conjunctivae normal.     Pupils: Pupils are equal, round, and reactive to light.  Neck:     Thyroid: No thyromegaly.  Cardiovascular:     Rate and Rhythm: Normal rate and regular rhythm.     Heart sounds: Normal heart sounds.  Pulmonary:     Effort: Pulmonary effort is normal.     Breath sounds: Normal breath sounds. No wheezing.  Musculoskeletal:     Cervical back: Neck supple.  Lymphadenopathy:     Cervical: No cervical adenopathy.  Skin:    General: Skin is warm and dry.  Neurological:     Mental Status: She is alert and oriented to person, place, and time.     No results found for any visits on 01/20/22.      Assessment & Plan:   Problem List Items Addressed This Visit       Cardiovascular and Mediastinum   Hypertension with intolerance to multiple antihypertensive drugs    Not at goal today.  She does feel little volume overloaded so discussed taking her spironolactone.  She is  going to go for labs next week.        Musculoskeletal and Integument   Dyshidrotic eczema - Primary    We will treat with Lotrisone cream though again she has had multiple skin scrapings that were negative for fungus.  We will treat for 2 weeks and see if it is improving.  Keep well moisturized.      Relevant Medications   clotrimazole-betamethasone (LOTRISONE) cream     Other   Depression with anxiety    Still struggling emotionally with not being able to see her granddaughter.  But the neighbors that were causing a lot of issues have moved away.      Chest pain, atypical    EKG today was very reassuring.  Rate of 85 bpm, normal sinus rhythm no acute ST-T wave changes.  No change from EKG from yesterday.      Other Visit Diagnoses     Atypical chest pain       Relevant Orders   EKG 12-Lead   Impacted cerumen of left ear       Serum potassium elevated       Relevant Orders   BASIC METABOLIC PANEL WITH GFR       Indication: Cerumen  impaction of the left ear  Medical necessity statement: On physical examination, cerumen impairs clinically significant portions of the external auditory canal, and tympanic membrane. Noted obstructive, copious cerumen that cannot be removed without magnification and instrumentations  Consent: Discussed benefits and risks of procedure and verbal consent obtained Procedure: Patient was prepped for the procedure. Utilized an otoscope to assess and take note of the ear canal, the tympanic membrane, and the presence, amount, and placement of the cerumen. Gentle water irrigation and soft plastic curette was utilized to remove cerumen.  Post procedure examination: shows cerumen was completely removed. Patient tolerated procedure well. The patient is made aware that they may experience temporary vertigo, temporary hearing loss, and temporary discomfort. If these symptom last for more than 24 hours to call the clinic or proceed to the ED.   Meds ordered this encounter  Medications   clotrimazole-betamethasone (LOTRISONE) cream    Sig: Apply 1 application  topically 2 (two) times daily.    Dispense:  45 g    Refill:  1    Return if symptoms worsen or fail to improve.  Beatrice Lecher, MD

## 2022-01-20 NOTE — Assessment & Plan Note (Signed)
We will treat with Lotrisone cream though again she has had multiple skin scrapings that were negative for fungus.  We will treat for 2 weeks and see if it is improving.  Keep well moisturized.

## 2022-01-20 NOTE — Patient Instructions (Signed)
Weight management # [phone]

## 2022-01-20 NOTE — Assessment & Plan Note (Signed)
EKG today was very reassuring.  Rate of 85 bpm, normal sinus rhythm no acute ST-T wave changes.  No change from EKG from yesterday.

## 2022-01-21 DIAGNOSIS — M79641 Pain in right hand: Secondary | ICD-10-CM | POA: Diagnosis not present

## 2022-01-21 DIAGNOSIS — Y999 Unspecified external cause status: Secondary | ICD-10-CM | POA: Diagnosis not present

## 2022-01-21 DIAGNOSIS — S6991XA Unspecified injury of right wrist, hand and finger(s), initial encounter: Secondary | ICD-10-CM | POA: Diagnosis not present

## 2022-01-21 DIAGNOSIS — W228XXA Striking against or struck by other objects, initial encounter: Secondary | ICD-10-CM | POA: Diagnosis not present

## 2022-01-26 ENCOUNTER — Ambulatory Visit (INDEPENDENT_AMBULATORY_CARE_PROVIDER_SITE_OTHER): Payer: Medicare HMO | Admitting: Family Medicine

## 2022-01-26 ENCOUNTER — Ambulatory Visit: Payer: Self-pay

## 2022-01-26 ENCOUNTER — Other Ambulatory Visit: Payer: Self-pay | Admitting: *Deleted

## 2022-01-26 ENCOUNTER — Other Ambulatory Visit: Payer: Self-pay

## 2022-01-26 ENCOUNTER — Encounter: Payer: Self-pay | Admitting: Family Medicine

## 2022-01-26 ENCOUNTER — Encounter (HOSPITAL_BASED_OUTPATIENT_CLINIC_OR_DEPARTMENT_OTHER): Payer: Self-pay

## 2022-01-26 ENCOUNTER — Emergency Department (HOSPITAL_BASED_OUTPATIENT_CLINIC_OR_DEPARTMENT_OTHER)
Admission: EM | Admit: 2022-01-26 | Discharge: 2022-01-26 | Discharge status: Against Medical Advice | Payer: Medicare HMO | Attending: Emergency Medicine | Admitting: Emergency Medicine

## 2022-01-26 ENCOUNTER — Telehealth: Payer: Self-pay

## 2022-01-26 VITALS — BP 188/110 | Ht 62.0 in | Wt 237.0 lb

## 2022-01-26 DIAGNOSIS — R000 Tachycardia, unspecified: Secondary | ICD-10-CM | POA: Diagnosis not present

## 2022-01-26 DIAGNOSIS — G5632 Lesion of radial nerve, left upper limb: Secondary | ICD-10-CM | POA: Insufficient documentation

## 2022-01-26 DIAGNOSIS — M79641 Pain in right hand: Secondary | ICD-10-CM | POA: Diagnosis not present

## 2022-01-26 DIAGNOSIS — S60211A Contusion of right wrist, initial encounter: Secondary | ICD-10-CM | POA: Insufficient documentation

## 2022-01-26 DIAGNOSIS — Z5321 Procedure and treatment not carried out due to patient leaving prior to being seen by health care provider: Secondary | ICD-10-CM | POA: Insufficient documentation

## 2022-01-26 DIAGNOSIS — I10 Essential (primary) hypertension: Secondary | ICD-10-CM | POA: Diagnosis not present

## 2022-01-26 DIAGNOSIS — M25531 Pain in right wrist: Secondary | ICD-10-CM

## 2022-01-26 DIAGNOSIS — Z87891 Personal history of nicotine dependence: Secondary | ICD-10-CM | POA: Diagnosis not present

## 2022-01-26 DIAGNOSIS — M79642 Pain in left hand: Secondary | ICD-10-CM

## 2022-01-26 NOTE — ED Notes (Signed)
Pt called for in lobby x 2 with no answer.  Per registration pt left

## 2022-01-26 NOTE — Telephone Encounter (Signed)
Pt called stating that she is still experiencing wrist pain for which she was seen for at the ED on 01/21/2022 and is requesting an OV with Dr. Madilyn Fireman.  Advised pt that Dr. Madilyn Fireman has no availability today and will be out of the office until 02/01/2022.  Advised pt that she may be seen at [facility] or she may see another provider in our office, but she declines and only wants to be seen by Dr. Madilyn Fireman.  Pt states that she has an appointment for 02/03/22 and she will "just have to live with the pain" until her appt.  Charyl Bigger, CMA

## 2022-01-26 NOTE — Patient Instructions (Signed)
Good to see you  Please try the splint  Please try the topical lidocaine and voltaren  Please send me a message in MyChart with any questions or updates.  Please see me back in 1 week.   --Dr. Raeford Razor

## 2022-01-26 NOTE — ED Triage Notes (Signed)
States had an IV in left hand on 6/8, continues to have pain in 4th & 5th finger. States also had right hand pain/swelling x 1 week, hit wrist against something.
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
## 2022-01-26 NOTE — Assessment & Plan Note (Signed)
Acutely occurring. There appears to be a mild fluid collection around the distal ulna. Likely a contusion.  - counseled on home exercise therapy and supportive care - applied splint today  - could consider further imaging.

## 2022-01-26 NOTE — Assessment & Plan Note (Signed)
Acutely occurring. Having altered sensation but no structural changes appreciated.  - counseled on home exercise therapy and supportive care - counseled on voltaren and topical lidocaine.

## 2022-01-27 DIAGNOSIS — Z9109 Other allergy status, other than to drugs and biological substances: Secondary | ICD-10-CM | POA: Diagnosis not present

## 2022-01-27 DIAGNOSIS — Z881 Allergy status to other antibiotic agents status: Secondary | ICD-10-CM | POA: Diagnosis not present

## 2022-01-27 DIAGNOSIS — Z885 Allergy status to narcotic agent status: Secondary | ICD-10-CM | POA: Diagnosis not present

## 2022-01-27 DIAGNOSIS — Z91018 Allergy to other foods: Secondary | ICD-10-CM | POA: Diagnosis not present

## 2022-01-27 DIAGNOSIS — Z882 Allergy status to sulfonamides status: Secondary | ICD-10-CM | POA: Diagnosis not present

## 2022-01-27 DIAGNOSIS — I10 Essential (primary) hypertension: Secondary | ICD-10-CM | POA: Diagnosis not present

## 2022-01-27 DIAGNOSIS — Z87891 Personal history of nicotine dependence: Secondary | ICD-10-CM | POA: Diagnosis not present

## 2022-01-27 DIAGNOSIS — Z888 Allergy status to other drugs, medicaments and biological substances status: Secondary | ICD-10-CM | POA: Diagnosis not present

## 2022-01-27 DIAGNOSIS — R519 Headache, unspecified: Secondary | ICD-10-CM | POA: Diagnosis not present

## 2022-01-27 DIAGNOSIS — E119 Type 2 diabetes mellitus without complications: Secondary | ICD-10-CM | POA: Diagnosis not present

## 2022-01-28 IMAGING — CT CT ABD-PELV W/ CM
2 of 5 series · 16 of 46 positions shown, 18 images · IV contrast (omnipaque)
Comparison: CT abdomen pelvis dated 08/08/2020.

CLINICAL DATA: 55-year-old female with abdominal pain. Concern for
hernia.

EXAM:
CT ABDOMEN AND PELVIS WITH CONTRAST
TECHNIQUE: Multidetector CT imaging of the abdomen and pelvis was performed
using the standard protocol following bolus administration of
intravenous contrast.
CONTRAST:  100mL OMNIPAQUE IOHEXOL 300 MG/ML  SOLN

[Series 2: axial st · axial · 0.92mm/px · z∈[-442,-17]mm · 13 of 95 slices shown, 15 images]
[im 5/95  soft-tissue]
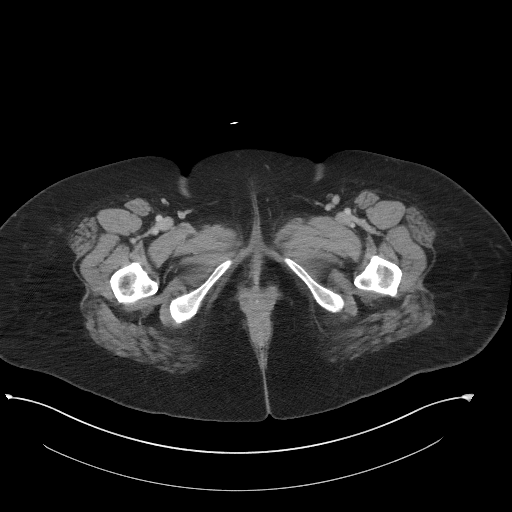
[im 5/95  bone]
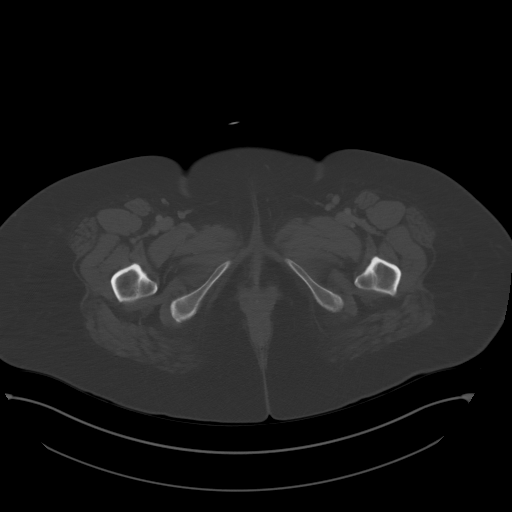
[im 15/95  soft-tissue]
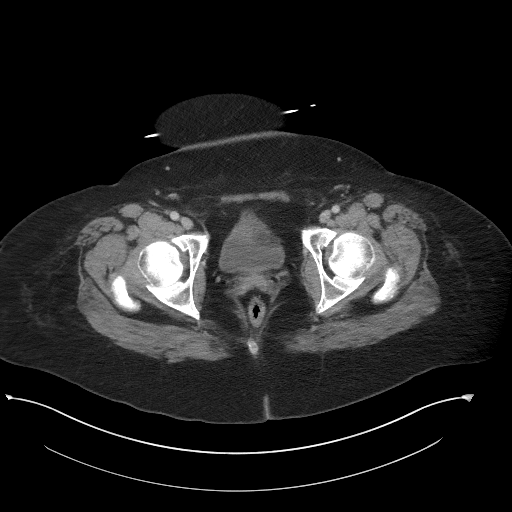
[im 19/95  soft-tissue]
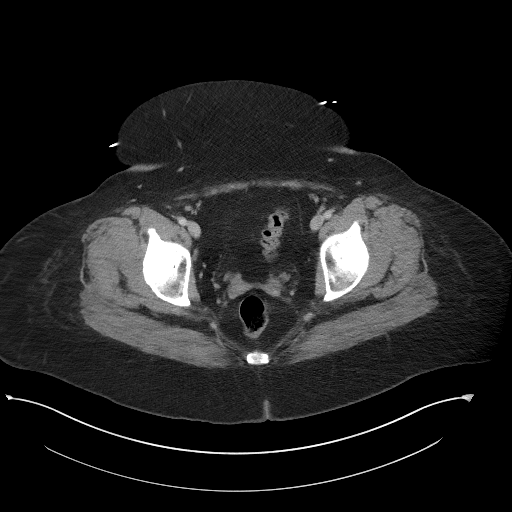
[im 29/95  soft-tissue]
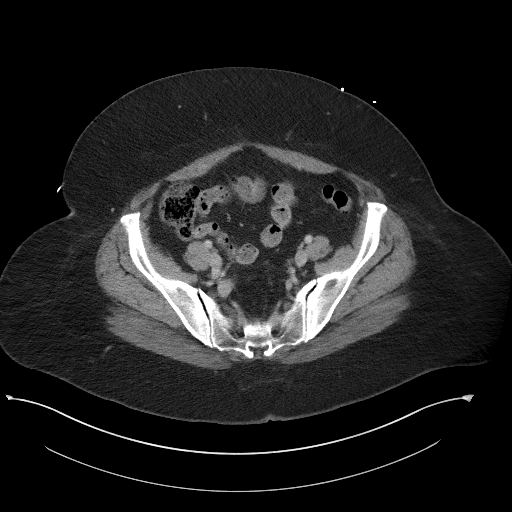
[im 33/95  soft-tissue]
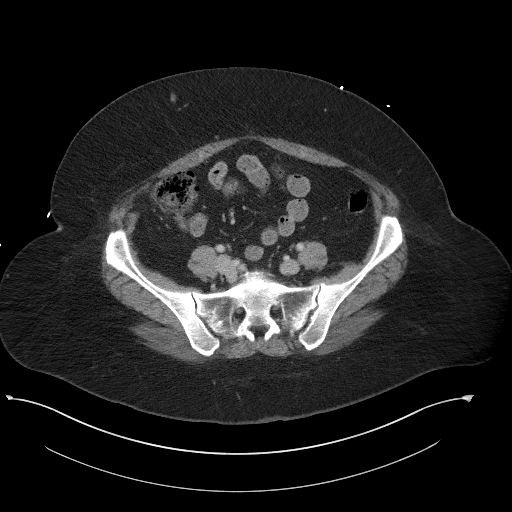
[im 43/95  soft-tissue]
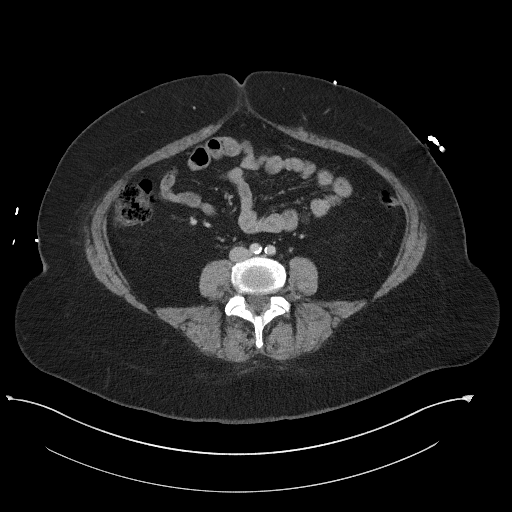
[im 48/95  soft-tissue]
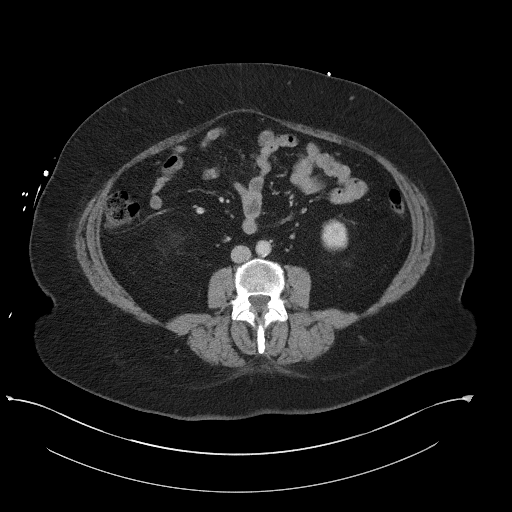
[im 52/95  soft-tissue]
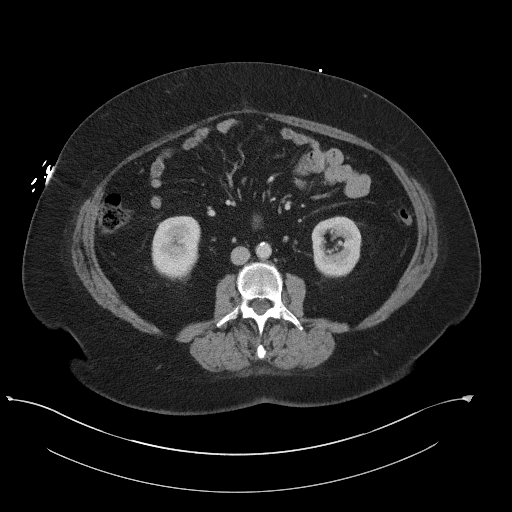
[im 62/95  soft-tissue]
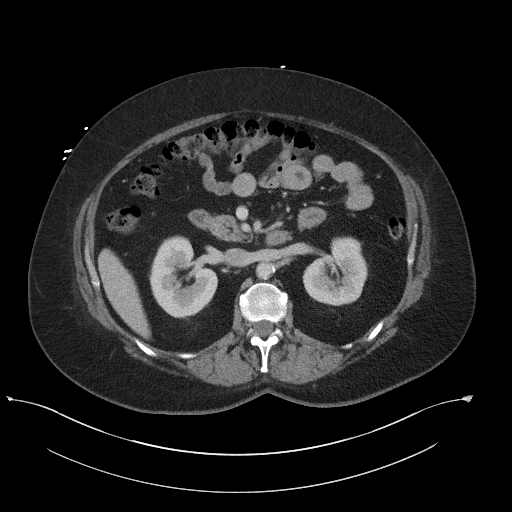
[im 62/95  bone]
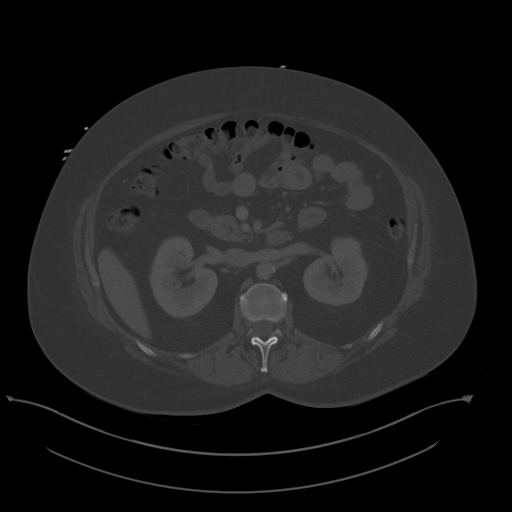
[im 66/95  soft-tissue]
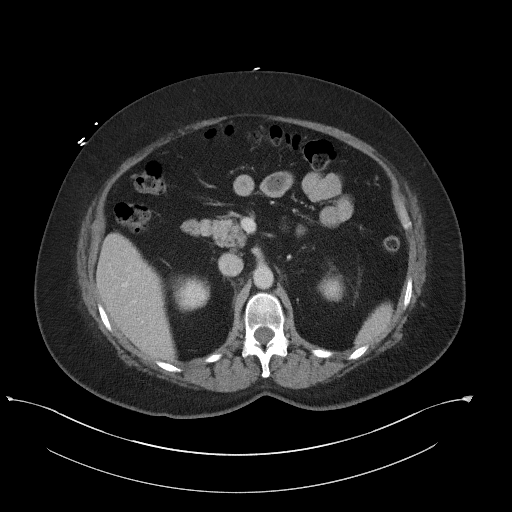
[im 76/95  soft-tissue]
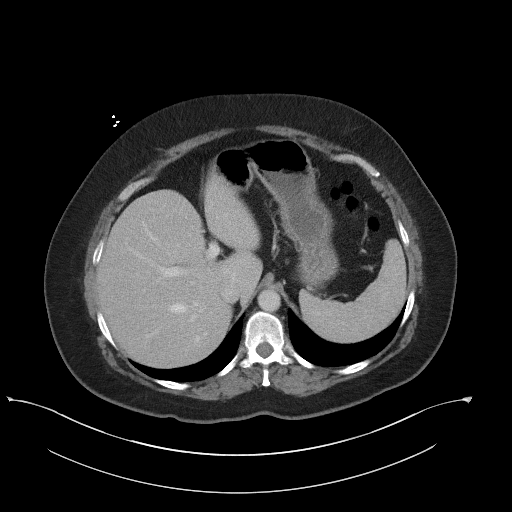
[im 80/95  soft-tissue]
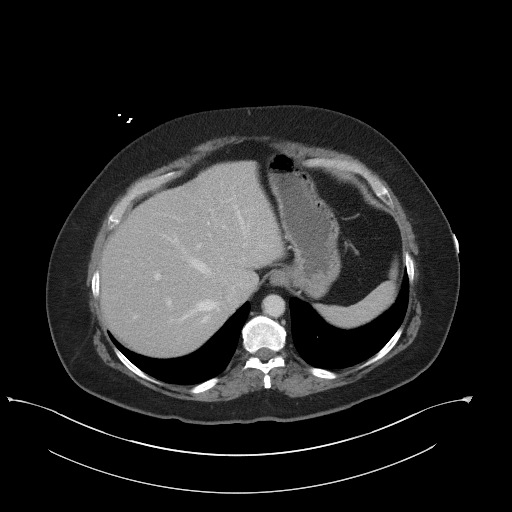
[im 90/95  soft-tissue]
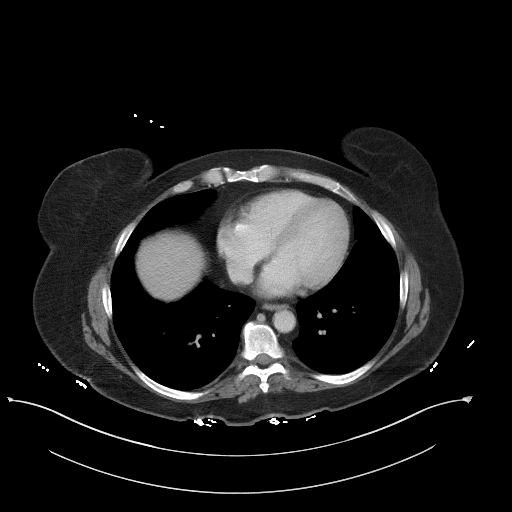

[Series 5: coronal st · coronal · 0.84mm/px · 3 of 112 slices shown]
[im 38/112  soft-tissue]
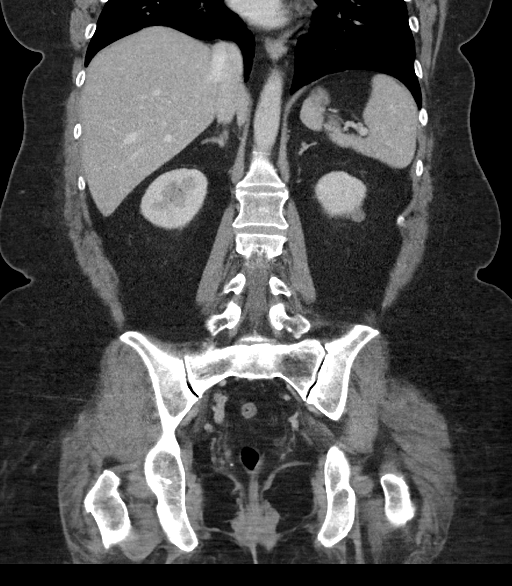
[im 50/112  soft-tissue]
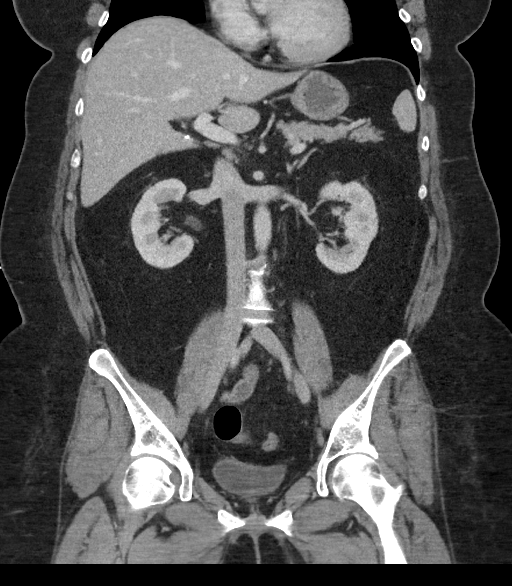
[im 62/112  soft-tissue]
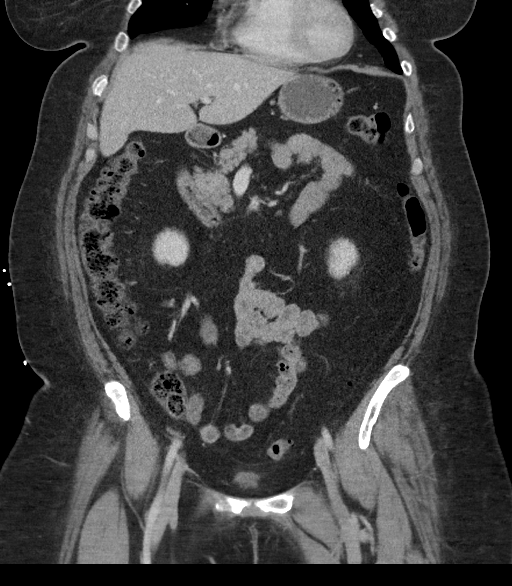

[16 of 46 positions shown; findings below may reference images not displayed]

FINDINGS: Lower chest: The visualized lung bases are clear.

No intra-abdominal free air or free fluid.

Hepatobiliary: Probable mild fatty liver. No intrahepatic biliary
ductal dilatation. Cholecystectomy. No retained calcified stone
noted in the central CBD.

Pancreas: Unremarkable. No pancreatic ductal dilatation or
surrounding inflammatory changes.

Spleen: Normal in size without focal abnormality.

Adrenals/Urinary Tract: The adrenal glands unremarkable. There is no
hydronephrosis on either side. There is symmetric enhancement and
excretion of contrast by both kidneys. Subcentimeter left renal
partially exophytic hypodense lesion is too small to characterize.
The visualized ureters and urinary bladder appear unremarkable.

Stomach/Bowel: There is no bowel obstruction or active inflammation.
There is sigmoid diverticulosis without active inflammatory changes.
Appendectomy.

Vascular/Lymphatic: Mild aortoiliac atherosclerotic disease. The IVC
is unremarkable. No portal venous gas. There is no adenopathy.

Reproductive: Hysterectomy.  No adnexal masses.

Other: None

Musculoskeletal: No acute or significant osseous findings.
IMPRESSION: 1. No acute intra-abdominal or pelvic pathology.
2. Sigmoid diverticulosis.
3. Aortic Atherosclerosis ([id number].X).

## 2022-01-29 DIAGNOSIS — H539 Unspecified visual disturbance: Secondary | ICD-10-CM | POA: Diagnosis not present

## 2022-01-29 DIAGNOSIS — R079 Chest pain, unspecified: Secondary | ICD-10-CM | POA: Diagnosis not present

## 2022-01-29 DIAGNOSIS — I10 Essential (primary) hypertension: Secondary | ICD-10-CM | POA: Diagnosis not present

## 2022-01-29 DIAGNOSIS — R42 Dizziness and giddiness: Secondary | ICD-10-CM | POA: Diagnosis not present

## 2022-01-29 DIAGNOSIS — I517 Cardiomegaly: Secondary | ICD-10-CM | POA: Diagnosis not present

## 2022-01-29 DIAGNOSIS — R000 Tachycardia, unspecified: Secondary | ICD-10-CM | POA: Diagnosis not present

## 2022-01-29 DIAGNOSIS — Z87891 Personal history of nicotine dependence: Secondary | ICD-10-CM | POA: Diagnosis not present

## 2022-01-30 DIAGNOSIS — E785 Hyperlipidemia, unspecified: Secondary | ICD-10-CM | POA: Diagnosis not present

## 2022-01-30 DIAGNOSIS — R9431 Abnormal electrocardiogram [ECG] [EKG]: Secondary | ICD-10-CM | POA: Diagnosis not present

## 2022-01-30 DIAGNOSIS — I491 Atrial premature depolarization: Secondary | ICD-10-CM | POA: Diagnosis not present

## 2022-01-30 DIAGNOSIS — E876 Hypokalemia: Secondary | ICD-10-CM | POA: Diagnosis not present

## 2022-01-30 DIAGNOSIS — R000 Tachycardia, unspecified: Secondary | ICD-10-CM | POA: Diagnosis not present

## 2022-01-30 DIAGNOSIS — R079 Chest pain, unspecified: Secondary | ICD-10-CM | POA: Diagnosis not present

## 2022-01-30 DIAGNOSIS — I10 Essential (primary) hypertension: Secondary | ICD-10-CM | POA: Diagnosis not present

## 2022-02-02 ENCOUNTER — Ambulatory Visit: Payer: Medicare HMO | Admitting: Family Medicine

## 2022-02-03 ENCOUNTER — Ambulatory Visit: Payer: Medicare HMO | Admitting: Family Medicine

## 2022-02-03 DIAGNOSIS — G4733 Obstructive sleep apnea (adult) (pediatric): Secondary | ICD-10-CM | POA: Diagnosis not present

## 2022-02-03 DIAGNOSIS — K219 Gastro-esophageal reflux disease without esophagitis: Secondary | ICD-10-CM | POA: Diagnosis not present

## 2022-02-03 DIAGNOSIS — R079 Chest pain, unspecified: Secondary | ICD-10-CM | POA: Diagnosis not present

## 2022-02-03 DIAGNOSIS — R002 Palpitations: Secondary | ICD-10-CM | POA: Diagnosis not present

## 2022-02-03 DIAGNOSIS — I10 Essential (primary) hypertension: Secondary | ICD-10-CM | POA: Diagnosis not present

## 2022-02-03 DIAGNOSIS — G894 Chronic pain syndrome: Secondary | ICD-10-CM | POA: Diagnosis not present

## 2022-02-03 DIAGNOSIS — R0789 Other chest pain: Secondary | ICD-10-CM | POA: Diagnosis not present

## 2022-02-03 DIAGNOSIS — I471 Supraventricular tachycardia: Secondary | ICD-10-CM | POA: Diagnosis not present

## 2022-02-03 DIAGNOSIS — E119 Type 2 diabetes mellitus without complications: Secondary | ICD-10-CM | POA: Diagnosis not present

## 2022-02-03 DIAGNOSIS — M79603 Pain in arm, unspecified: Secondary | ICD-10-CM | POA: Diagnosis not present

## 2022-02-03 DIAGNOSIS — G35 Multiple sclerosis: Secondary | ICD-10-CM | POA: Diagnosis not present

## 2022-02-03 DIAGNOSIS — E785 Hyperlipidemia, unspecified: Secondary | ICD-10-CM | POA: Diagnosis not present

## 2022-02-03 DIAGNOSIS — R42 Dizziness and giddiness: Secondary | ICD-10-CM | POA: Diagnosis not present

## 2022-02-04 DIAGNOSIS — E269 Hyperaldosteronism, unspecified: Secondary | ICD-10-CM | POA: Diagnosis not present

## 2022-02-04 DIAGNOSIS — G4733 Obstructive sleep apnea (adult) (pediatric): Secondary | ICD-10-CM | POA: Diagnosis not present

## 2022-02-04 DIAGNOSIS — R072 Precordial pain: Secondary | ICD-10-CM | POA: Diagnosis not present

## 2022-02-04 DIAGNOSIS — E7800 Pure hypercholesterolemia, unspecified: Secondary | ICD-10-CM | POA: Diagnosis not present

## 2022-02-04 DIAGNOSIS — Z8249 Family history of ischemic heart disease and other diseases of the circulatory system: Secondary | ICD-10-CM | POA: Diagnosis not present

## 2022-02-04 DIAGNOSIS — E119 Type 2 diabetes mellitus without complications: Secondary | ICD-10-CM | POA: Diagnosis not present

## 2022-02-04 DIAGNOSIS — E785 Hyperlipidemia, unspecified: Secondary | ICD-10-CM | POA: Diagnosis not present

## 2022-02-04 DIAGNOSIS — I10 Essential (primary) hypertension: Secondary | ICD-10-CM | POA: Diagnosis not present

## 2022-02-04 DIAGNOSIS — K219 Gastro-esophageal reflux disease without esophagitis: Secondary | ICD-10-CM | POA: Diagnosis not present

## 2022-02-04 DIAGNOSIS — G35 Multiple sclerosis: Secondary | ICD-10-CM | POA: Diagnosis not present

## 2022-02-04 DIAGNOSIS — G894 Chronic pain syndrome: Secondary | ICD-10-CM | POA: Diagnosis not present

## 2022-02-04 DIAGNOSIS — R079 Chest pain, unspecified: Secondary | ICD-10-CM | POA: Diagnosis not present

## 2022-02-04 DIAGNOSIS — I471 Supraventricular tachycardia: Secondary | ICD-10-CM | POA: Diagnosis not present

## 2022-02-05 DIAGNOSIS — G4733 Obstructive sleep apnea (adult) (pediatric): Secondary | ICD-10-CM | POA: Diagnosis not present

## 2022-02-05 DIAGNOSIS — E7800 Pure hypercholesterolemia, unspecified: Secondary | ICD-10-CM | POA: Diagnosis not present

## 2022-02-05 DIAGNOSIS — E119 Type 2 diabetes mellitus without complications: Secondary | ICD-10-CM | POA: Diagnosis not present

## 2022-02-05 DIAGNOSIS — G894 Chronic pain syndrome: Secondary | ICD-10-CM | POA: Diagnosis not present

## 2022-02-05 DIAGNOSIS — I10 Essential (primary) hypertension: Secondary | ICD-10-CM | POA: Diagnosis not present

## 2022-02-05 DIAGNOSIS — G35 Multiple sclerosis: Secondary | ICD-10-CM | POA: Diagnosis not present

## 2022-02-05 DIAGNOSIS — I471 Supraventricular tachycardia: Secondary | ICD-10-CM | POA: Diagnosis not present

## 2022-02-05 DIAGNOSIS — K219 Gastro-esophageal reflux disease without esophagitis: Secondary | ICD-10-CM | POA: Diagnosis not present

## 2022-02-05 DIAGNOSIS — R079 Chest pain, unspecified: Secondary | ICD-10-CM | POA: Diagnosis not present

## 2022-02-05 DIAGNOSIS — E269 Hyperaldosteronism, unspecified: Secondary | ICD-10-CM | POA: Diagnosis not present

## 2022-02-05 DIAGNOSIS — R072 Precordial pain: Secondary | ICD-10-CM | POA: Diagnosis not present

## 2022-02-05 DIAGNOSIS — Z8249 Family history of ischemic heart disease and other diseases of the circulatory system: Secondary | ICD-10-CM | POA: Diagnosis not present

## 2022-02-05 DIAGNOSIS — E785 Hyperlipidemia, unspecified: Secondary | ICD-10-CM | POA: Diagnosis not present

## 2022-02-06 DIAGNOSIS — I10 Essential (primary) hypertension: Secondary | ICD-10-CM | POA: Diagnosis not present

## 2022-02-06 DIAGNOSIS — R079 Chest pain, unspecified: Secondary | ICD-10-CM | POA: Diagnosis not present

## 2022-02-06 DIAGNOSIS — R072 Precordial pain: Secondary | ICD-10-CM | POA: Diagnosis not present

## 2022-02-06 DIAGNOSIS — Z8249 Family history of ischemic heart disease and other diseases of the circulatory system: Secondary | ICD-10-CM | POA: Diagnosis not present

## 2022-02-06 DIAGNOSIS — E7800 Pure hypercholesterolemia, unspecified: Secondary | ICD-10-CM | POA: Diagnosis not present

## 2022-02-07 ENCOUNTER — Encounter (HOSPITAL_BASED_OUTPATIENT_CLINIC_OR_DEPARTMENT_OTHER): Payer: Self-pay | Admitting: Urology

## 2022-02-07 ENCOUNTER — Emergency Department (HOSPITAL_BASED_OUTPATIENT_CLINIC_OR_DEPARTMENT_OTHER)
Admission: EM | Admit: 2022-02-07 | Discharge: 2022-02-07 | Disposition: A | Discharge status: Home / Self Care | Payer: Medicare HMO | Attending: Emergency Medicine | Admitting: Emergency Medicine

## 2022-02-07 ENCOUNTER — Other Ambulatory Visit: Payer: Self-pay

## 2022-02-07 DIAGNOSIS — T83091A Other mechanical complication of indwelling urethral catheter, initial encounter: Secondary | ICD-10-CM | POA: Diagnosis not present

## 2022-02-07 DIAGNOSIS — G8918 Other acute postprocedural pain: Secondary | ICD-10-CM | POA: Insufficient documentation

## 2022-02-07 DIAGNOSIS — I10 Essential (primary) hypertension: Secondary | ICD-10-CM | POA: Insufficient documentation

## 2022-02-07 DIAGNOSIS — Z4801 Encounter for change or removal of surgical wound dressing: Secondary | ICD-10-CM | POA: Insufficient documentation

## 2022-02-07 DIAGNOSIS — I2510 Atherosclerotic heart disease of native coronary artery without angina pectoris: Secondary | ICD-10-CM | POA: Diagnosis not present

## 2022-02-07 DIAGNOSIS — Z5189 Encounter for other specified aftercare: Secondary | ICD-10-CM

## 2022-02-07 DIAGNOSIS — Z79899 Other long term (current) drug therapy: Secondary | ICD-10-CM | POA: Diagnosis not present

## 2022-02-07 DIAGNOSIS — R209 Unspecified disturbances of skin sensation: Secondary | ICD-10-CM | POA: Diagnosis not present

## 2022-02-07 NOTE — ED Provider Notes (Signed)
[place] [facility] Provider Note   CSN: [id number] Arrival date & time: 02/07/22  1643     History Chief Complaint  Patient presents with   post Cath concern   Post-op Problem    Jennifer [name] is a 56 y.o. female patient with history of GERD, coronary artery disease, hypertension, hyperlipidemia who presents to the emerged department with right hand tingling that started earlier today.  Patient had a heart cath yesterday via right radial artery which showed mild coronary artery disease.  Since then she has been having some bruising to the right anterior forearm.  HPI     Home Medications Prior to Admission medications   Medication Sig Start Date End Date Taking? Authorizing Provider  ACCU-CHEK GUIDE test strip For testing blood sugars dailyDx:r73.03 06/17/21   [name], MD  bisacodyl (DULCOLAX) 5 MG EC tablet Take by mouth.    [provider]  ciclesonide (ALVESCO) 80 MCG/ACT inhaler 1 puff twice a day for 2 weeks or until cough and wheeze free. Patient not taking: Reported on 01/11/2022 12/28/21   Althea Charon, FNP  clotrimazole-betamethasone (LOTRISONE) cream Apply 1 application  topically 2 (two) times daily. 01/20/22   [name], MD  Docusate Sodium (DSS) 100 MG CAPS Take by mouth.    [provider]  EPINEPHrine 0.3 mg/0.3 mL IJ SOAJ injection Use as directed for severe allergic reactions 11/10/21   Althea Charon, FNP  famotidine (PEPCID) 20 MG tablet Take by mouth. 05/06/21   [provider]  Fluticasone Furoate (ARNUITY ELLIPTA) 100 MCG/ACT AEPB  12/05/21   [provider]  levalbuterol (XOPENEX HFA) 45 MCG/ACT inhaler INHALE 2 PUFFS INTO THE LUNGS EVERY 6 HOURS AS NEEDED FOR WHEEZING 11/10/21   Althea Charon, FNP  levalbuterol Penne Lash) 1.25 MG/3ML nebulizer solution Take 1.25 mg by nebulization every 3 (three) hours as needed for wheezing. 09/28/20   Valentina Shaggy, MD  lisinopril  (ZESTRIL) 5 MG tablet Take 1 tablet (5 mg total) by mouth daily. 11/24/21   [name], MD  metoprolol tartrate (LOPRESSOR) 50 MG tablet Take 1 tablet (50 mg total) by mouth 2 (two) times daily. 11/24/21   [name], MD  nystatin (MYCOSTATIN) 100000 UNIT/ML suspension Take 5 ml 4 times a day for 1 week for symptoms of thrush. 12/01/21   Ambs, Kathrine Cords, FNP  ondansetron (ZOFRAN-ODT) 4 MG disintegrating tablet Take 1 tablet (4 mg total) by mouth every 8 (eight) hours as needed for nausea or vomiting. 12/06/21   Tegeler, Gwenyth Allegra, MD  potassium chloride (KLOR-CON) 20 MEQ packet Take 20 mEq by mouth daily. 09/09/21   [name], MD  sucralfate (CARAFATE) 1 GM/10ML suspension Take 10 mLs (1 g total) by mouth 4 (four) times daily -  with meals and at bedtime. 11/03/21   [name], MD      Allergies    Azithromycin, Ciprofloxacin, Codeine, Erythromycin base, Sulfa antibiotics, Sulfasalazine, Telmisartan, Ace inhibitors, Aspirin, Atenolol, Avelox [moxifloxacin hcl in nacl], Beta adrenergic blockers, Buspar [buspirone], Butorphanol tartrate, Cetirizine, Clonidine hcl, Cortisone, Erythromycin, Fentanyl, Fluoxetine hcl, Ketorolac tromethamine, Lidocaine, Lisinopril, Metoclopramide hcl, Midazolam, Montelukast, Montelukast sodium, Naproxen, Paroxetine, Penicillins, Pravastatin, Promethazine, Promethazine hcl, Quinolones, Serotonin reuptake inhibitors (ssris), Sertraline hcl, Stelazine [trifluoperazine], Tobramycin, Trifluoperazine hcl, Atrovent nasal spray [ipratropium], Diltiazem, Iodinated contrast media, Polyethylene glycol 3350, Propoxyphene, Adhesive [tape], Butorphanol, Ceftriaxone, Iron, Metoclopramide, Metronidazole, Other, Prednisone, Prochlorperazine, Venlafaxine, and Zyrtec [cetirizine hcl]    Review of Systems   Review of  Systems  All other systems reviewed and are negative.   Physical Exam Updated Vital Signs BP (!) 153/94 (BP Location: Left Arm)   Pulse  (!) 103   Temp 98.6 F (37 C) (Oral)   Resp 18   Ht '5\' 2"'$  (1.575 m)   Wt 107.5 kg   LMP 06/25/2013   SpO2 98%   BMI 43.35 kg/m  Physical Exam Vitals and nursing note reviewed.  Constitutional:      General: She is not in acute distress.    Appearance: Normal appearance.  HENT:     Head: Normocephalic and atraumatic.  Eyes:     General:        Right eye: No discharge.        Left eye: No discharge.  Cardiovascular:     Comments: Regular rate and rhythm.  S1/S2 are distinct without any evidence of murmur, rubs, or gallops.   Pulmonary:     Comments: Clear to auscultation bilaterally.  Normal effort.  No respiratory distress.  No evidence of wheezes, rales, or rhonchi heard throughout. Abdominal:     General: Abdomen is flat. Bowel sounds are normal. There is no distension.     Tenderness: There is no abdominal tenderness. There is no guarding or rebound.  Musculoskeletal:        General: Normal range of motion.     Cervical back: Neck supple.  Skin:    General: Skin is warm and dry.     Findings: No rash.     Comments: 2+ radial pulse felt on the right wrist.  There is some slight ecchymosis superior to the catheterization site.  Wound is well-healing and no evidence of surrounding erythema.  Patient has subjective decreased sensation in the fingers but great cap refill.  Full range of motion in the wrist and hand.  Compartments of the forearm on the right are soft.  Neurological:     General: No focal deficit present.     Mental Status: She is alert.  Psychiatric:        Mood and Affect: Mood normal.        Behavior: Behavior normal.     ED Results / Procedures / Treatments   Labs (all labs ordered are listed, but only abnormal results are displayed) Labs Reviewed - No data to display  EKG None  Radiology No results found.  Procedures Procedures    Medications Ordered in ED Medications - No data to display  ED Course/ Medical Decision Making/ A&P                            Medical Decision Making Jennifer [name] is a 56 y.o. female patient who presents to the emerged from today for further evaluation of numbness to the right hand.  Patient has good cap refill and a strong 2+ radial pulse felt on the right hand.  This is likely just irritation of the nerve from the heart catheterization yesterday.  There is probably some small amount of bleeding underneath the skin that is causing agitation.  I notified patient of this.  No evidence of compartment syndrome in the forearm.  I instructed her to follow-up with her PCP for further evaluation.  This would likely resolve in the coming days as the blood gets ribs or back to the body.  Strict turn precaution were discussed.  She stable discharge.     Final Clinical Impression(s) / ED Diagnoses Final  diagnoses:  Post-op pain  Wound check, abscess    Rx / DC Orders ED Discharge Orders     None         Hendricks Limes, [place] 02/08/22 2909    Wyvonnia Dusky, MD 02/08/22 [id number]

## 2022-02-07 NOTE — ED Notes (Signed)
ED Provider at bedside. 

## 2022-02-08 DIAGNOSIS — Z87891 Personal history of nicotine dependence: Secondary | ICD-10-CM | POA: Diagnosis not present

## 2022-02-08 DIAGNOSIS — E876 Hypokalemia: Secondary | ICD-10-CM | POA: Diagnosis not present

## 2022-02-08 DIAGNOSIS — R079 Chest pain, unspecified: Secondary | ICD-10-CM | POA: Diagnosis not present

## 2022-02-08 DIAGNOSIS — R9431 Abnormal electrocardiogram [ECG] [EKG]: Secondary | ICD-10-CM | POA: Diagnosis not present

## 2022-02-08 DIAGNOSIS — R61 Generalized hyperhidrosis: Secondary | ICD-10-CM | POA: Diagnosis not present

## 2022-02-08 DIAGNOSIS — R0789 Other chest pain: Secondary | ICD-10-CM | POA: Diagnosis not present

## 2022-02-08 DIAGNOSIS — R0600 Dyspnea, unspecified: Secondary | ICD-10-CM | POA: Diagnosis not present

## 2022-02-08 DIAGNOSIS — I10 Essential (primary) hypertension: Secondary | ICD-10-CM | POA: Diagnosis not present

## 2022-02-10 ENCOUNTER — Encounter (HOSPITAL_BASED_OUTPATIENT_CLINIC_OR_DEPARTMENT_OTHER): Payer: Self-pay | Admitting: Emergency Medicine

## 2022-02-10 ENCOUNTER — Other Ambulatory Visit: Payer: Self-pay

## 2022-02-10 ENCOUNTER — Emergency Department (HOSPITAL_BASED_OUTPATIENT_CLINIC_OR_DEPARTMENT_OTHER): Payer: Medicare HMO

## 2022-02-10 ENCOUNTER — Emergency Department (HOSPITAL_BASED_OUTPATIENT_CLINIC_OR_DEPARTMENT_OTHER)
Admission: EM | Admit: 2022-02-10 | Discharge: 2022-02-10 | Disposition: A | Discharge status: Home / Self Care | Payer: Medicare HMO | Attending: Emergency Medicine | Admitting: Emergency Medicine

## 2022-02-10 DIAGNOSIS — E876 Hypokalemia: Secondary | ICD-10-CM | POA: Diagnosis not present

## 2022-02-10 DIAGNOSIS — R079 Chest pain, unspecified: Secondary | ICD-10-CM | POA: Diagnosis not present

## 2022-02-10 DIAGNOSIS — R002 Palpitations: Secondary | ICD-10-CM | POA: Diagnosis not present

## 2022-02-10 DIAGNOSIS — Z79899 Other long term (current) drug therapy: Secondary | ICD-10-CM | POA: Insufficient documentation

## 2022-02-10 DIAGNOSIS — R0602 Shortness of breath: Secondary | ICD-10-CM | POA: Insufficient documentation

## 2022-02-10 DIAGNOSIS — Z7951 Long term (current) use of inhaled steroids: Secondary | ICD-10-CM | POA: Insufficient documentation

## 2022-02-10 DIAGNOSIS — J45909 Unspecified asthma, uncomplicated: Secondary | ICD-10-CM | POA: Diagnosis not present

## 2022-02-10 DIAGNOSIS — I10 Essential (primary) hypertension: Secondary | ICD-10-CM | POA: Diagnosis not present

## 2022-02-10 DIAGNOSIS — E2609 Other primary hyperaldosteronism: Secondary | ICD-10-CM | POA: Diagnosis not present

## 2022-02-10 DIAGNOSIS — R000 Tachycardia, unspecified: Secondary | ICD-10-CM | POA: Diagnosis not present

## 2022-02-10 DIAGNOSIS — G4733 Obstructive sleep apnea (adult) (pediatric): Secondary | ICD-10-CM | POA: Diagnosis not present

## 2022-02-10 DIAGNOSIS — E785 Hyperlipidemia, unspecified: Secondary | ICD-10-CM | POA: Diagnosis not present

## 2022-02-10 NOTE — ED Provider Notes (Signed)
[place] [facility] Provider Note   CSN: [id number] Arrival date & time: 02/10/22  1660     History  Chief Complaint  Patient presents with   Multiple Complaints    Jennifer [name] is a 56 y.o. female.  Patient here with palpitations, shortness of breath.  History of high blood pressure, multiple allergies, PTSD, seizures, acid reflux, asthma.  Had heart cath this past week that was unremarkable.  She has follow-up with cardiology today.  She has noticed at times her blood pressure still high.  But she denies any chest pain or headaches.  Nothing makes it feel better or worse.  No abdominal pain or active shortness of breath now.  Palpitations seem to have resolved.  Denies any sputum production or fever or chills.  Denies any weakness or numbness  The history is provided by the patient.       Home Medications Prior to Admission medications   Medication Sig Start Date End Date Taking? Authorizing Provider  ACCU-CHEK GUIDE test strip For testing blood sugars dailyDx:r73.03 06/17/21   [name], MD  bisacodyl (DULCOLAX) 5 MG EC tablet Take by mouth.    [provider]  ciclesonide (ALVESCO) 80 MCG/ACT inhaler 1 puff twice a day for 2 weeks or until cough and wheeze free. Patient not taking: Reported on 01/11/2022 12/28/21   Althea Charon, FNP  clotrimazole-betamethasone (LOTRISONE) cream Apply 1 application  topically 2 (two) times daily. 01/20/22   [name], MD  Docusate Sodium (DSS) 100 MG CAPS Take by mouth.    [provider]  EPINEPHrine 0.3 mg/0.3 mL IJ SOAJ injection Use as directed for severe allergic reactions 11/10/21   Althea Charon, FNP  famotidine (PEPCID) 20 MG tablet Take by mouth. 05/06/21   [provider]  Fluticasone Furoate (ARNUITY ELLIPTA) 100 MCG/ACT AEPB  12/05/21   [provider]  levalbuterol (XOPENEX HFA) 45 MCG/ACT inhaler INHALE 2 PUFFS INTO THE LUNGS EVERY 6 HOURS AS  NEEDED FOR WHEEZING 11/10/21   Althea Charon, FNP  levalbuterol Penne Lash) 1.25 MG/3ML nebulizer solution Take 1.25 mg by nebulization every 3 (three) hours as needed for wheezing. 09/28/20   Valentina Shaggy, MD  lisinopril (ZESTRIL) 5 MG tablet Take 1 tablet (5 mg total) by mouth daily. 11/24/21   [name], MD  metoprolol tartrate (LOPRESSOR) 50 MG tablet Take 1 tablet (50 mg total) by mouth 2 (two) times daily. 11/24/21   [name], MD  nystatin (MYCOSTATIN) 100000 UNIT/ML suspension Take 5 ml 4 times a day for 1 week for symptoms of thrush. 12/01/21   Ambs, Kathrine Cords, FNP  ondansetron (ZOFRAN-ODT) 4 MG disintegrating tablet Take 1 tablet (4 mg total) by mouth every 8 (eight) hours as needed for nausea or vomiting. 12/06/21   Tegeler, Gwenyth Allegra, MD  potassium chloride (KLOR-CON) 20 MEQ packet Take 20 mEq by mouth daily. 09/09/21   [name], MD  sucralfate (CARAFATE) 1 GM/10ML suspension Take 10 mLs (1 g total) by mouth 4 (four) times daily -  with meals and at bedtime. 11/03/21   [name], MD      Allergies    Azithromycin, Ciprofloxacin, Codeine, Erythromycin base, Sulfa antibiotics, Sulfasalazine, Telmisartan, Ace inhibitors, Aspirin, Atenolol, Avelox [moxifloxacin hcl in nacl], Beta adrenergic blockers, Buspar [buspirone], Butorphanol tartrate, Cetirizine, Clonidine hcl, Cortisone, Erythromycin, Fentanyl, Fluoxetine hcl, Ketorolac tromethamine, Lidocaine, Lisinopril, Metoclopramide hcl, Midazolam, Montelukast, Montelukast sodium, Naproxen, Paroxetine, Penicillins, Pravastatin, Promethazine, Promethazine hcl, Quinolones, Serotonin reuptake  inhibitors (ssris), Sertraline hcl, Stelazine [trifluoperazine], Tobramycin, Trifluoperazine hcl, Atrovent nasal spray [ipratropium], Diltiazem, Iodinated contrast media, Polyethylene glycol 3350, Propoxyphene, Adhesive [tape], Butorphanol, Ceftriaxone, Iron, Metoclopramide, Metronidazole, Other, Prednisone,  Prochlorperazine, Venlafaxine, and Zyrtec [cetirizine hcl]    Review of Systems   Review of Systems  Physical Exam Updated Vital Signs BP (!) 197/107   Pulse 85   Temp 98.3 F (36.8 C) (Oral)   Resp 20   LMP 06/25/2013   SpO2 100%  Physical Exam Vitals and nursing note reviewed.  Constitutional:      General: She is not in acute distress.    Appearance: She is well-developed. She is not ill-appearing.  HENT:     Head: Normocephalic and atraumatic.     Nose: Nose normal.     Mouth/Throat:     Mouth: Mucous membranes are moist.  Eyes:     Extraocular Movements: Extraocular movements intact.     Conjunctiva/sclera: Conjunctivae normal.     Pupils: Pupils are equal, round, and reactive to light.  Cardiovascular:     Rate and Rhythm: Normal rate and regular rhythm.     Pulses: Normal pulses.     Heart sounds: Normal heart sounds. No murmur heard. Pulmonary:     Effort: Pulmonary effort is normal. No respiratory distress.     Breath sounds: Normal breath sounds.  Abdominal:     Palpations: Abdomen is soft.     Tenderness: There is no abdominal tenderness.  Musculoskeletal:        General: No swelling.     Cervical back: Normal range of motion and neck supple.  Skin:    General: Skin is warm and dry.     Capillary Refill: Capillary refill takes less than 2 seconds.  Neurological:     General: No focal deficit present.     Mental Status: She is alert.  Psychiatric:        Mood and Affect: Mood normal.     ED Results / Procedures / Treatments   Labs (all labs ordered are listed, but only abnormal results are displayed) Labs Reviewed - No data to display  EKG None  Radiology DG Chest 2 View  Result Date: 02/10/2022 CLINICAL DATA:  Shortness of breath and chest EXAM: CHEST - 2 VIEW COMPARISON:  Chest x-ray dated January 13, 2022 FINDINGS: The heart size and mediastinal contours are within normal limits. Both lungs are clear. Old right-sided rib fracture. IMPRESSION: No  active cardiopulmonary disease. Electronically Signed   By: Yetta Glassman M.D.   On: 02/10/2022 10:33    Procedures Procedures    Medications Ordered in ED Medications - No data to display  ED Course/ Medical Decision Making/ A&P                           Medical Decision Making Amount and/or Complexity of Data Reviewed Radiology: ordered.   FRONIA DEPASS is here with palpitations.  EKG per my review and interpretation shows sinus rhythm.  No ischemic changes.  Blood pressure is mildly elevated but she is asymptomatic.  No chest pain or headaches.  No stroke symptoms.  Vital signs are otherwise unremarkable.  Chest x-ray per my review and interpretation shows no pneumonia or pneumothorax.  Patient per chart review has had several ED visits here over the last several months for various complaints.  She had significant cardiac work-up this past week including cardiac catheterization that showed nonobstructive disease.  I have  no concern for ACS or PE or infectious process.  My suspicion is that this is likely anxiety related.  I do not think there is any medical emergency going on at this time.  She is not complaining of any nausea vomiting or diarrhea.  Overall she has follow-up with cardiology this afternoon.  They can continue to work with her about her blood pressure management.  Discharged in good condition.  This chart was dictated using voice recognition software.  Despite best efforts to proofread,  errors can occur which can change the documentation meaning.         Final Clinical Impression(s) / ED Diagnoses Final diagnoses:  Palpitations    Rx / DC Orders ED Discharge Orders     None         Lennice Sites, DO 02/10/22 1039

## 2022-02-10 NOTE — Discharge Instructions (Signed)
Chest x-ray shows no signs of infection.  EKG is normal.  Follow-up with cardiology.

## 2022-02-10 NOTE — ED Triage Notes (Signed)
Patient presents to ED via POV from home. Patient reports 2 day history of shortness of breath, chest pain, HTN and "feeling tachy". Patient has an appointment with her cardiologist today at 1430. Pressured speech noted in triage. Able to speak full, complete sentences.

## 2022-02-12 DIAGNOSIS — R0789 Other chest pain: Secondary | ICD-10-CM | POA: Diagnosis not present

## 2022-02-12 DIAGNOSIS — Z9889 Other specified postprocedural states: Secondary | ICD-10-CM | POA: Diagnosis not present

## 2022-02-12 DIAGNOSIS — R109 Unspecified abdominal pain: Secondary | ICD-10-CM | POA: Diagnosis not present

## 2022-02-12 DIAGNOSIS — R1084 Generalized abdominal pain: Secondary | ICD-10-CM | POA: Diagnosis not present

## 2022-02-12 DIAGNOSIS — R110 Nausea: Secondary | ICD-10-CM | POA: Diagnosis not present

## 2022-02-12 DIAGNOSIS — R000 Tachycardia, unspecified: Secondary | ICD-10-CM | POA: Diagnosis not present

## 2022-02-12 DIAGNOSIS — R39198 Other difficulties with micturition: Secondary | ICD-10-CM | POA: Diagnosis not present

## 2022-02-12 DIAGNOSIS — I517 Cardiomegaly: Secondary | ICD-10-CM | POA: Diagnosis not present

## 2022-02-12 DIAGNOSIS — R002 Palpitations: Secondary | ICD-10-CM | POA: Diagnosis not present

## 2022-02-13 DIAGNOSIS — R002 Palpitations: Secondary | ICD-10-CM | POA: Diagnosis not present

## 2022-02-13 DIAGNOSIS — R1084 Generalized abdominal pain: Secondary | ICD-10-CM | POA: Diagnosis not present

## 2022-02-13 DIAGNOSIS — R000 Tachycardia, unspecified: Secondary | ICD-10-CM | POA: Diagnosis not present

## 2022-02-16 ENCOUNTER — Ambulatory Visit: Payer: Self-pay | Admitting: Internal Medicine

## 2022-02-16 DIAGNOSIS — K439 Ventral hernia without obstruction or gangrene: Secondary | ICD-10-CM | POA: Diagnosis not present

## 2022-02-16 DIAGNOSIS — R109 Unspecified abdominal pain: Secondary | ICD-10-CM | POA: Diagnosis not present

## 2022-02-16 DIAGNOSIS — K5900 Constipation, unspecified: Secondary | ICD-10-CM | POA: Diagnosis not present

## 2022-02-16 DIAGNOSIS — N3289 Other specified disorders of bladder: Secondary | ICD-10-CM | POA: Diagnosis not present

## 2022-02-16 DIAGNOSIS — K760 Fatty (change of) liver, not elsewhere classified: Secondary | ICD-10-CM | POA: Diagnosis not present

## 2022-02-16 DIAGNOSIS — K5730 Diverticulosis of large intestine without perforation or abscess without bleeding: Secondary | ICD-10-CM | POA: Diagnosis not present

## 2022-02-16 DIAGNOSIS — N281 Cyst of kidney, acquired: Secondary | ICD-10-CM | POA: Diagnosis not present

## 2022-02-16 DIAGNOSIS — K429 Umbilical hernia without obstruction or gangrene: Secondary | ICD-10-CM | POA: Diagnosis not present
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
## 2022-02-17 ENCOUNTER — Ambulatory Visit (INDEPENDENT_AMBULATORY_CARE_PROVIDER_SITE_OTHER): Payer: Medicare Other | Admitting: Family Medicine

## 2022-02-17 ENCOUNTER — Encounter: Payer: Self-pay | Admitting: Family Medicine

## 2022-02-17 VITALS — BP 142/76 | HR 97 | Ht 62.0 in | Wt 236.0 lb

## 2022-02-17 DIAGNOSIS — R1084 Generalized abdominal pain: Secondary | ICD-10-CM

## 2022-02-17 DIAGNOSIS — K760 Fatty (change of) liver, not elsewhere classified: Secondary | ICD-10-CM

## 2022-02-17 DIAGNOSIS — R21 Rash and other nonspecific skin eruption: Secondary | ICD-10-CM | POA: Diagnosis not present

## 2022-02-17 DIAGNOSIS — I10 Essential (primary) hypertension: Secondary | ICD-10-CM

## 2022-02-17 DIAGNOSIS — R0981 Nasal congestion: Secondary | ICD-10-CM

## 2022-02-17 DIAGNOSIS — G4733 Obstructive sleep apnea (adult) (pediatric): Secondary | ICD-10-CM | POA: Diagnosis not present

## 2022-02-17 MED ORDER — GABAPENTIN 100 MG PO CAPS: 100.0000 mg | ORAL_CAPSULE | Freq: Two times a day (BID) | ORAL | 1 refills | Status: DC

## 2022-02-17 NOTE — Assessment & Plan Note (Signed)
Repeat blood pressure improved but still not at goal continue with 3 times daily metoprolol and hopefully will be able to add on the amlodipine we discussed that most people require 3 blood pressure medications to be well controlled.
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
## 2022-02-17 NOTE — Assessment & Plan Note (Signed)
Continue to monitor liver function every 6 months.

## 2022-02-17 NOTE — Assessment & Plan Note (Signed)
Last sleep study was in 2015 showing an AHI of 14.  I suspect that it may have worsened over the last several years.  She has tried CPAP in the past and has not been able to tolerate it but has been able to tolerate a BiPAP.  And at that time we could not get BiPAP covered for mild sleep apnea.  Plan to retest.  She would prefer to have it done in [place] if at all possible.

## 2022-02-19 DIAGNOSIS — R000 Tachycardia, unspecified: Secondary | ICD-10-CM | POA: Diagnosis not present

## 2022-02-19 DIAGNOSIS — I10 Essential (primary) hypertension: Secondary | ICD-10-CM | POA: Diagnosis not present

## 2022-02-19 DIAGNOSIS — R109 Unspecified abdominal pain: Secondary | ICD-10-CM | POA: Diagnosis not present

## 2022-02-19 DIAGNOSIS — Z87891 Personal history of nicotine dependence: Secondary | ICD-10-CM | POA: Diagnosis not present

## 2022-02-19 DIAGNOSIS — R0602 Shortness of breath: Secondary | ICD-10-CM | POA: Diagnosis not present

## 2022-02-20 DIAGNOSIS — E2609 Other primary hyperaldosteronism: Secondary | ICD-10-CM | POA: Diagnosis not present

## 2022-02-20 DIAGNOSIS — I10 Essential (primary) hypertension: Secondary | ICD-10-CM | POA: Diagnosis not present

## 2022-02-20 DIAGNOSIS — I498 Other specified cardiac arrhythmias: Secondary | ICD-10-CM | POA: Diagnosis not present

## 2022-02-20 DIAGNOSIS — R000 Tachycardia, unspecified: Secondary | ICD-10-CM | POA: Diagnosis not present

## 2022-02-20 DIAGNOSIS — M7989 Other specified soft tissue disorders: Secondary | ICD-10-CM | POA: Diagnosis not present

## 2022-02-21 DIAGNOSIS — E2609 Other primary hyperaldosteronism: Secondary | ICD-10-CM | POA: Diagnosis not present

## 2022-02-21 DIAGNOSIS — I10 Essential (primary) hypertension: Secondary | ICD-10-CM | POA: Diagnosis not present

## 2022-02-21 DIAGNOSIS — E611 Iron deficiency: Secondary | ICD-10-CM | POA: Diagnosis not present

## 2022-02-22 NOTE — Patient Instructions (Incomplete)
Asthma Stop Alvesco for asthma flares Continue Xopenex 2 puffs every 6 hours as needed for cough, wheeze,tightness in chest, or shortness of breath OR Xopenex 1.25 mg 1 unit dose via nebulizer every 4-6 hours as needed For asthma flare, begin Pulmicort (budesonide) 0.5 mg once a day via nebulizer for 2 weeks or until cough and wheeze free. She has a nebulizer. Continue your Aerobika device as prescribed by Pulmonary  Continue to follow up with your pulmonary team  Reflux Continue dietary lifestyle modifications as listed below Continue the regimen as prescribed by your gastroenterologist. It is important to keep your follow-up appointment for your EGD/colonoscopy  Chronic rhinitis Stop flunisolide  We will see if we can get Xhance approved using 1 spray each nostril twice a day.  Info given along with demonstration Continue to perform nasal saline rinses 1-3 times a day as needed to help clean sinus tract - NeilMed sinus rinses DO NOT USE TAP WATER, can use distilled water or tap water that you boil for several minutes and then let cool prior to use.  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 Rowe Clack for further treatment of these issues  Call the clinic if this treatment plan is not working well for you  Follow up in 2-3 months or sooner if needed.

## 2022-02-23 ENCOUNTER — Encounter: Payer: Self-pay | Admitting: Family

## 2022-02-23 ENCOUNTER — Ambulatory Visit: Payer: Medicare Other | Admitting: Family

## 2022-02-23 ENCOUNTER — Other Ambulatory Visit: Payer: Self-pay | Admitting: Family Medicine

## 2022-02-23 VITALS — BP 142/88 | HR 86 | Temp 98.1°F | Resp 18

## 2022-02-23 DIAGNOSIS — J331 Polypoid sinus degeneration: Secondary | ICD-10-CM | POA: Diagnosis not present

## 2022-02-23 DIAGNOSIS — K219 Gastro-esophageal reflux disease without esophagitis: Secondary | ICD-10-CM

## 2022-02-23 DIAGNOSIS — J4540 Moderate persistent asthma, uncomplicated: Secondary | ICD-10-CM

## 2022-02-23 DIAGNOSIS — J310 Chronic rhinitis: Secondary | ICD-10-CM

## 2022-02-23 DIAGNOSIS — J383 Other diseases of vocal cords: Secondary | ICD-10-CM | POA: Diagnosis not present

## 2022-02-23 MED ORDER — XHANCE 93 MCG/ACT NA EXHU: 2.0000 | INHALANT_SUSPENSION | Freq: Two times a day (BID) | NASAL | 2 refills | Status: DC

## 2022-02-23 NOTE — Progress Notes (Signed)
[place] [id number] Dept: [phone]  FOLLOW UP NOTE  Patient ID: Jennifer [name], female    DOB: 1965-09-06  Age: 56 y.o. MRN: [id number] Date of Office Visit: 02/23/2022  Assessment  Chief Complaint: Asthma  HPI Jennifer [name] is a 56 year old female who presents today for an acute visit.  She was last seen on the Jan 11, 2022 by Gareth Morgan, FNP for not well controlled moderate persistent asthma, gastroesophageal reflux disease, chronic rhinitis, and vocal cord dysfunction.  She denies any new diagnosis since her last office visit.  She reports that she did have a heart catheterization approximately 3 weeks ago and did not have any stents placed.  Moderate persistent asthma:  She reports dry coughing fits, shortness of breath with walking down the hall, and tightness in her chest at times.  Se also reports the sensation of feeling something move in her chest.  She denies wheezing, fever, and chills.  She is starting to wonder if she has asthma.  She has just been using Xopenex and reports that her air conditioner is out and that her asthma is worse with the heat.  She has been living in her apartment for 7 years.  She is requesting a letter stating that with her asthma she needs air conditioning.  She is not using  Alvesco 80 mcg 1 puff twice a day for 2 weeks for asthma flares due to all inhalers causing patchiness in her tongue and mouth.  She reports that she has nystatin to use at times and she will also have problems with thrush even when she is not using an inhaler.  It does not even matter if she gargles her mouth with mouthwash and brushes her teeth she will still get thrush.  Since her last office visit she has not received any systemic steroids, but has made multiple trips to the emergency room or urgent care.  She reports that they have been for other problems such as abdominal pain and belly pain.  After reviewing her chart it looks like she did go to the emergency  room on January 13, 2022, January 17, 2019 and February 08, 2022 for shortness of breath and acute dyspnea.  She has not received any systemic steroids since her last office visit.  She has tried Singulair in the past and was told to never taken it again.  She is also tried Qvar, Pulmicort, Atrovent, Advair, Flovent, Asmanex, and Spiriva.  She also reports that she is limited to what inhaler she can use due to her cardiac history.  She did see [facility] and reports that they did not make any changes to her asthma regimen.  She did show me a copy of the chest x-ray from Dec 30, 2021 that shows, "no acute findings.  No chronic lung changes."She also had a chest x-ray on February 10, 2022 showing: "No active cardiopulmonary disease."  Reflux: She reports her reflux has been not as bad.  She continues to take Pepcid.  She is not able to keep her appointment for her EGD/colonoscopy.  She reports that she was sick with her blood pressure and heart catheterization.  This appointment got postponed until August.  Chronic rhinitis: She reports flunisolide nasal spray is not helping.  She reports that saline nasal rinse is doing any good.  She wonders if it is making her symptoms worse.  She has been complaining of her ears hurting.  Especially her left ear.  She  has tried not using much force with her saline rinse.  She has had testing on her ears at her primary care physician's office.  She reports that she has seen ENT in the past.  She reports nasal congestion and postnasal drip sometimes.  She denies rhinorrhea.  She has not had any sinus infections since we last saw her.  She did try a sample of Xhance in the office today without any problems.  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 Rowe Clack a month or so ago and that her vocal cords look good.  She did want her to work with speech.  She reports that that her vocal cords will not function correctly at times.   Drug Allergies:   Allergies  Allergen Reactions   Azithromycin Shortness Of Breath    Lip swelling, SOB.      Ciprofloxacin Swelling    REACTION: tongue swells   Codeine Shortness Of Breath   Erythromycin Base Itching and Rash   Sulfa Antibiotics Shortness Of Breath, Rash and Other (See Comments)   Sulfasalazine Rash and Shortness Of Breath    Other reaction(s): Other (See Comments) Other reaction(s): SHORTNESS OF BREATH   Telmisartan Swelling    Tongue swelling, Micardis   Ace Inhibitors Cough   Aspirin Hives and Other (See Comments)    flushing   Atenolol Other (See Comments)    Squeezing chest sensation   Avelox [Moxifloxacin Hcl In Nacl] Itching        Beta Adrenergic Blockers Other (See Comments)    Feels like chest tightening labetalol, bystolic  Feels like chest tightening "Metoprolol"    Buspar [Buspirone] Other (See Comments)    Light headed   Butorphanol Tartrate Other (See Comments)    Patient aggitated   Cetirizine Hives and Rash        Clonidine Hcl     REACTION: makes blood pressure high   Cortisone     Feels like she is going crazy   Erythromycin Rash   Fentanyl Other (See Comments)    aggressive    Fluoxetine Hcl Other (See Comments)    REACTION: headaches   Ketorolac Tromethamine     jittery   Lidocaine Other (See Comments)    When it involves the throat,    Lisinopril Cough   Metoclopramide Hcl Other (See Comments)    Dystonic reaction   Midazolam Other (See Comments)    agitation Slow to wake up   Montelukast Other (See Comments)    Singulair   Montelukast Sodium Other (See Comments)    DOES NOT REMEMBER  Don't remember-told not to take   Naproxen Other (See Comments)    FLUSHING Pt states she took Ibuprofen today (10/08/19)   Paroxetine Other (See Comments)    REACTION: headaches   Penicillins Rash   Pravastatin Other (See Comments)    Myalgias   Promethazine Other (See Comments)    Dystonic reaction   Promethazine Hcl Other (See Comments)     jittery   Quinolones Swelling and Rash   Serotonin Reuptake Inhibitors (Ssris) Other (See Comments)    Headache Effexor, prozac, zoloft,    Sertraline Hcl     REACTION: headaches   Stelazine [Trifluoperazine] Other (See Comments)    Dystonic reaction   Tobramycin Itching and Rash   Trifluoperazine Hcl     dystonic   Atrovent Nasal Spray [Ipratropium]     Tachycardia and shaking   Diltiazem Other (See Comments)  Chest pain   Iodinated Contrast Media     Other reaction(s): Other (See Comments) Chest heaviness/sob   Polyethylene Glycol 3350     Other reaction(s): Laryngeal Edema (ALLERGY)   Propoxyphene    Adhesive [Tape] Rash    EKG monitor patches, some tapes Blisters, rash, itching, welts.   Butorphanol Anxiety    Patient agitated   Ceftriaxone Rash    rocephin   Iron Rash    Flushing with certain IV types   Metoclopramide Itching and Other (See Comments)    Dystonic reaction   Metronidazole Rash   Other Rash and Other (See Comments)    Uncoded Allergy. Allergen: steriods, Other Reaction: Not Assessed Other reaction(s): Flushing (ALLERGY/intolerance), GI Upset (intolerance), Hypertension (intolerance), Increased Heart Rate (intolerance), Mental Status Changes (intolerance), Other (See Comments), Tachycardia / Palpitations  (intolerance) Hospital gowns leave a rash.    Prednisone Anxiety and Palpitations   Prochlorperazine Anxiety    Compazine:  Dystonic reaction   Venlafaxine Anxiety   Zyrtec [Cetirizine Hcl] Rash    All over body    Review of Systems: Review of Systems  Constitutional:  Negative for chills and fever.  HENT:         Reports nasal congestion and postnasal drip sometimes.  Denies rhinorrhea  Respiratory:  Positive for cough and shortness of breath. Negative for wheezing.        Reports dry coughing fits, tightness in her chest at times, sensation of something moving in her chest, and shortness of breath with walking down the hallway.  Denies  wheezing  Cardiovascular:  Positive for chest pain and palpitations.       Reports continued chest pain and palpitations.  She reports that she had symptoms last night.  Gastrointestinal:        Reports reflux is not as bad.  She is currently still taking Pepcid  Genitourinary:  Positive for frequency.       She reports that they are checking into the calcium that was found in her urine.  Skin:  Positive for itching. Negative for rash.       Reports itching on her ankle.  Her primary care physician gave her a cream to try.  Neurological:  Positive for headaches.       Reports headaches at times and she wonders if it is in correlation with her increased blood pressure.  Endo/Heme/Allergies:  Negative for environmental allergies.     Physical Exam: BP (!) 142/88   Pulse 86   Temp 98.1 F (36.7 C) (Temporal)   Resp 18   LMP 06/25/2013   SpO2 98%    Physical Exam Constitutional:      Appearance: Normal appearance.  HENT:     Head: Normocephalic and atraumatic.     Comments: Pharynx normal, eyes normal: Bilateral lower turbinates mildly edematous and slightly erythematous with no drainage noted    Right Ear: Tympanic membrane, ear canal and external ear normal.     Left Ear: Tympanic membrane, ear canal and external ear normal.     Mouth/Throat:     Mouth: Mucous membranes are moist.  Eyes:     Conjunctiva/sclera: Conjunctivae normal.  Cardiovascular:     Rate and Rhythm: Normal rate and regular rhythm.     Heart sounds: Normal heart sounds.  Pulmonary:     Effort: Pulmonary effort is normal.     Breath sounds: Normal breath sounds.     Comments: Lungs clear to auscultation Musculoskeletal:  Cervical back: Neck supple.  Skin:    General: Skin is warm.  Neurological:     Mental Status: She is alert and oriented to person, place, and time.  Psychiatric:        Mood and Affect: Mood normal.        Behavior: Behavior normal.        Thought Content: Thought content  normal.        Judgment: Judgment normal.     Diagnostics: FVC 2.13 L (70%), FEV1 1.88 (77%).  Predicted FVC 3.05 L, predicted FEV1 2.43 L.  Spirometry indicates possible mild restriction.  Spirometry is consistent with previous spirometry's  Assessment and Plan: 1. Not well controlled moderate persistent asthma   2. Chronic rhinitis   3. Vocal cord dysfunction   4. Gastroesophageal reflux disease without esophagitis   5. Polypoid sinus degeneration     Meds ordered this encounter  Medications   Fluticasone Propionate (XHANCE) 93 MCG/ACT EXHU    Sig: Place 2 puffs into both nostrils in the morning and at bedtime.    Dispense:  16 mL    Refill:  2    [phone]. Dx: J33.1. ineffective control of symptoms with Flonase, Nasacort, Nasonex, Flonase, and Qnasl    Patient Instructions  Asthma Stop Alvesco for asthma flares Continue Xopenex 2 puffs every 6 hours as needed for cough, wheeze,tightness in chest, or shortness of breath OR Xopenex 1.25 mg 1 unit dose via nebulizer every 4-6 hours as needed For asthma flare, begin Pulmicort (budesonide) 0.5 mg once a day via nebulizer for 2 weeks or until cough and wheeze free. She has a nebulizer. Continue your Aerobika device as prescribed by Pulmonary  Continue to follow up with your pulmonary team  Reflux Continue dietary lifestyle modifications as listed below Continue the regimen as prescribed by your gastroenterologist. It is important to keep your follow-up appointment for your EGD/colonoscopy  Chronic rhinitis Stop flunisolide  We will see if we can get Xhance approved using 1 spray each nostril twice a day.  Info given along with demonstration Continue to perform nasal saline rinses 1-3 times a day as needed to help clean sinus tract - NeilMed sinus rinses DO NOT USE TAP WATER, can use distilled water or tap water that you boil for several minutes and then let cool prior to use.  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 Rowe Clack for further treatment of these issues  Call the clinic if this treatment plan is not working well for you  Follow up in 2-3 months or sooner if needed. Return in about 2 months (around 04/26/2022), or if symptoms worsen or fail to improve.    Thank you for the opportunity to care for this patient.  Please do not hesitate to contact me with questions.  Althea Charon, FNP [facility] [place] [facility]

## 2022-02-25 IMAGING — DX DG CHEST 1V PORT
1 series · 1 of 1 positions shown · non-contrast
Comparison: 07/31/2020, 11/22/2020

CLINICAL DATA: Chest pain short of breath

EXAM:
PORTABLE CHEST 1 VIEW

[chest ap]
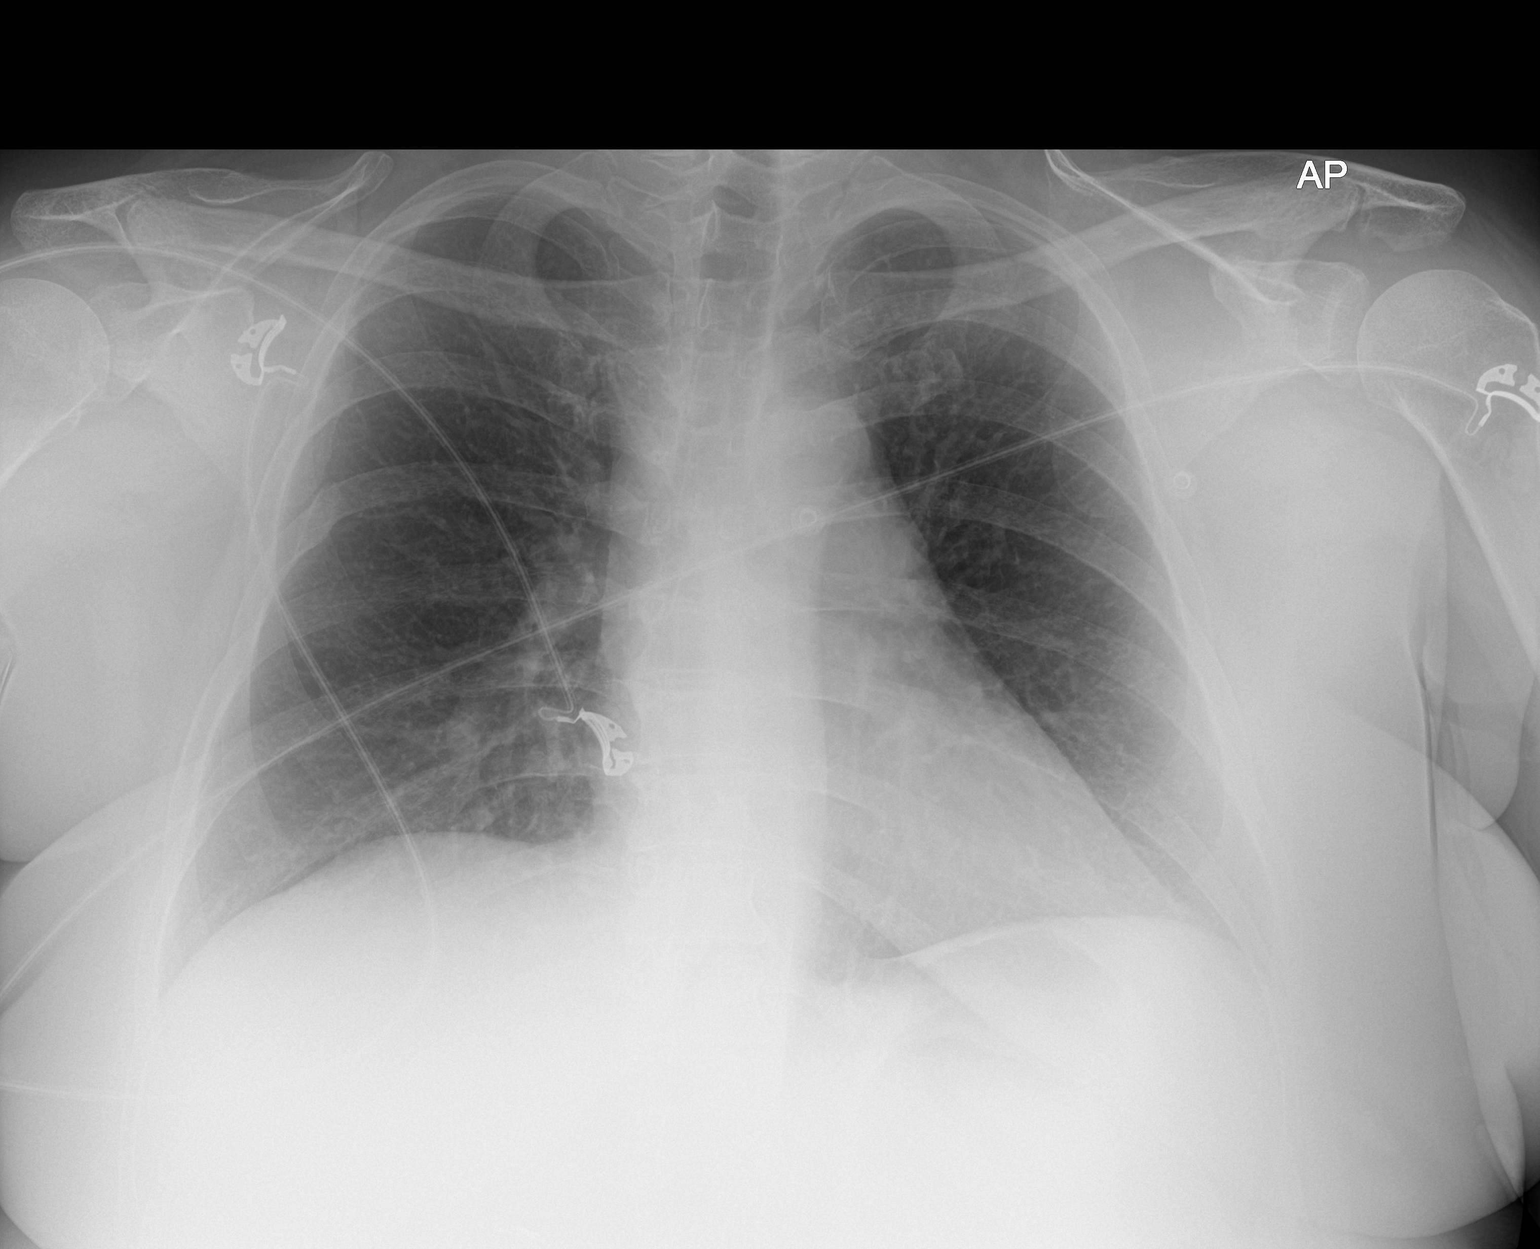

[1 of 1 positions shown; findings below may reference images not displayed]

FINDINGS: No focal opacity or pleural effusion. Stable cardiomediastinal
silhouette. No pneumothorax. Chronic right-sided rib fractures.
IMPRESSION: No active disease.

## 2022-02-26 DIAGNOSIS — R1084 Generalized abdominal pain: Secondary | ICD-10-CM | POA: Diagnosis not present

## 2022-02-26 DIAGNOSIS — R0602 Shortness of breath: Secondary | ICD-10-CM | POA: Diagnosis not present

## 2022-02-26 DIAGNOSIS — Z7951 Long term (current) use of inhaled steroids: Secondary | ICD-10-CM | POA: Diagnosis not present

## 2022-02-26 DIAGNOSIS — R9431 Abnormal electrocardiogram [ECG] [EKG]: Secondary | ICD-10-CM | POA: Diagnosis not present

## 2022-02-26 DIAGNOSIS — R030 Elevated blood-pressure reading, without diagnosis of hypertension: Secondary | ICD-10-CM | POA: Diagnosis not present

## 2022-02-26 DIAGNOSIS — R112 Nausea with vomiting, unspecified: Secondary | ICD-10-CM | POA: Diagnosis not present

## 2022-02-27 DIAGNOSIS — R079 Chest pain, unspecified: Secondary | ICD-10-CM | POA: Diagnosis not present

## 2022-03-03 ENCOUNTER — Encounter: Payer: Self-pay | Admitting: Family Medicine

## 2022-03-03 ENCOUNTER — Ambulatory Visit (INDEPENDENT_AMBULATORY_CARE_PROVIDER_SITE_OTHER): Payer: Medicare Other | Admitting: Family Medicine

## 2022-03-03 VITALS — BP 148/79 | HR 101 | Ht 62.0 in | Wt 238.0 lb

## 2022-03-03 DIAGNOSIS — R002 Palpitations: Secondary | ICD-10-CM | POA: Diagnosis not present

## 2022-03-03 DIAGNOSIS — R7989 Other specified abnormal findings of blood chemistry: Secondary | ICD-10-CM | POA: Diagnosis not present

## 2022-03-03 DIAGNOSIS — I700 Atherosclerosis of aorta: Secondary | ICD-10-CM | POA: Diagnosis not present

## 2022-03-03 DIAGNOSIS — R000 Tachycardia, unspecified: Secondary | ICD-10-CM | POA: Diagnosis not present

## 2022-03-03 DIAGNOSIS — J9811 Atelectasis: Secondary | ICD-10-CM | POA: Diagnosis not present

## 2022-03-03 DIAGNOSIS — M25562 Pain in left knee: Secondary | ICD-10-CM | POA: Diagnosis not present

## 2022-03-03 DIAGNOSIS — R21 Rash and other nonspecific skin eruption: Secondary | ICD-10-CM

## 2022-03-03 DIAGNOSIS — L609 Nail disorder, unspecified: Secondary | ICD-10-CM

## 2022-03-03 DIAGNOSIS — R0789 Other chest pain: Secondary | ICD-10-CM | POA: Diagnosis not present

## 2022-03-03 DIAGNOSIS — R079 Chest pain, unspecified: Secondary | ICD-10-CM | POA: Diagnosis not present

## 2022-03-03 DIAGNOSIS — M25552 Pain in left hip: Secondary | ICD-10-CM | POA: Diagnosis not present

## 2022-03-03 DIAGNOSIS — E1165 Type 2 diabetes mellitus with hyperglycemia: Secondary | ICD-10-CM | POA: Diagnosis not present

## 2022-03-03 DIAGNOSIS — R030 Elevated blood-pressure reading, without diagnosis of hypertension: Secondary | ICD-10-CM | POA: Diagnosis not present

## 2022-03-03 DIAGNOSIS — R0600 Dyspnea, unspecified: Secondary | ICD-10-CM | POA: Diagnosis not present

## 2022-03-03 NOTE — Progress Notes (Signed)
Established Patient Office Visit  Subjective   Patient ID: Jennifer [name], female    DOB: 1966/06/07  Age: 56 y.o. MRN: [id number]  Chief Complaint  Patient presents with   Follow-up         HPI  She did want to let me know that she is doing a 24-hour urine for Dr. Posey Pronto with endocrinology.  He needs a sleep study referral with [facility] to [place].  They are requiring her to have a  He is interested in getting blood work to be tested for thyroiditis and Hashimoto's.  The rash on her foot is still itching and the cream does not seem to be helping.  He has been doing the tea tree oil soaks and using the cream some but felt like it was not really helping.  Wants to know if her B12 shot is due today or if she needs labs.  Last serum level was about 5 months ago.  She has a spot where she gave herself a Lovenox injection and would like me to take a look at it.  She still feels a little knot there and has some discoloration of the skin.  Is been having pain in her left leg and foot and hip.  He has been seen in the emergency room since I last saw her.  She went to the ED on July 16 for nausea and vomiting and abdominal pain.  Several years ago she had all of her nails removed but some of them are growing back and she would like to have that addressed.  She would like referral to podiatry.    ROS    Objective:     BP (!) 148/79   Pulse (!) 101   Ht '5\' 2"'$  (1.575 m)   Wt 238 lb (108 kg)   LMP 06/25/2013   SpO2 94%   BMI 43.53 kg/m    Physical Exam Constitutional:      Appearance: She is well-developed.  HENT:     Head: Normocephalic and atraumatic.     Right Ear: Tympanic membrane, ear canal and external ear normal.     Left Ear: Tympanic membrane, ear canal and external ear normal.     Nose: Nose normal.  Eyes:     Conjunctiva/sclera: Conjunctivae normal.     Pupils: Pupils are equal, round, and reactive to light.     Comments: Nares with mild erythema.    Neck:     Thyroid: No thyromegaly.  Cardiovascular:     Rate and Rhythm: Normal rate and regular rhythm.     Heart sounds: Normal heart sounds.  Pulmonary:     Effort: Pulmonary effort is normal.     Breath sounds: Normal breath sounds. No wheezing.  Abdominal:       Comments: Some pigment change of the skin over the lower abdomen where she had the injection.  In the area on the right side there is a small may be half a centimeter palpable knot underneath the skin.  It is tender to touch.  Musculoskeletal:     Cervical back: Neck supple.  Lymphadenopathy:     Cervical: No cervical adenopathy.  Skin:    General: Skin is warm and dry.  Neurological:     Mental Status: She is alert and oriented to person, place, and time.      No results found for any visits on 03/03/22.    The ASCVD Risk score (Arnett DK, et al., 2019) failed to  calculate for the following reasons:   The patient has a prior MI or stroke diagnosis    Assessment & Plan:   Problem List Items Addressed This Visit   None Visit Diagnoses     Left hip pain    -  Primary   Relevant Orders   B12   Vitamin B1   Vitamin B6   TSH + free T4   T3, free   Thyroid peroxidase antibody   Ambulatory referral to Physical Therapy   Acute pain of left knee       Relevant Orders   B12   Vitamin B1   Vitamin B6   TSH + free T4   T3, free   Thyroid peroxidase antibody   Ambulatory referral to Physical Therapy   Nail problem       Relevant Orders   Ambulatory referral to Podiatry      Left hip and left knee pain-recommend referral for formal physical therapy she would prefer to do that in [facility].  Nail Problem-we will go ahead and refer to podiatry.  Rash on left outer foot-skin scraping performed.  She does not seem to be responding to the Lotrisone.  We will call with results once available did encourage her to apply a moisturizer after showering and after doing her foot soaks.  Again podiatry can take a  look at it as well.  No follow-ups on file.   I spent 45 minutes on the day of the encounter to include pre-visit record review, face-to-face time with the patient and post visit ordering of test.   Beatrice Lecher, MD

## 2022-03-03 NOTE — Addendum Note (Signed)
Addended by: Teddy Spike on: 03/03/2022 04:11 PM   Modules accepted: Orders

## 2022-03-04 ENCOUNTER — Encounter (HOSPITAL_BASED_OUTPATIENT_CLINIC_OR_DEPARTMENT_OTHER): Payer: Self-pay | Admitting: Emergency Medicine

## 2022-03-04 ENCOUNTER — Emergency Department (HOSPITAL_BASED_OUTPATIENT_CLINIC_OR_DEPARTMENT_OTHER): Payer: Medicare Other

## 2022-03-04 ENCOUNTER — Other Ambulatory Visit: Payer: Self-pay

## 2022-03-04 ENCOUNTER — Emergency Department (HOSPITAL_BASED_OUTPATIENT_CLINIC_OR_DEPARTMENT_OTHER)
Admission: EM | Admit: 2022-03-04 | Discharge: 2022-03-04 | Disposition: A | Discharge status: Home / Self Care | Payer: Medicare Other | Attending: Emergency Medicine | Admitting: Emergency Medicine

## 2022-03-04 DIAGNOSIS — Z8541 Personal history of malignant neoplasm of cervix uteri: Secondary | ICD-10-CM | POA: Insufficient documentation

## 2022-03-04 DIAGNOSIS — Z7951 Long term (current) use of inhaled steroids: Secondary | ICD-10-CM | POA: Diagnosis not present

## 2022-03-04 DIAGNOSIS — M79605 Pain in left leg: Secondary | ICD-10-CM | POA: Diagnosis not present

## 2022-03-04 DIAGNOSIS — R0789 Other chest pain: Secondary | ICD-10-CM | POA: Insufficient documentation

## 2022-03-04 DIAGNOSIS — R7309 Other abnormal glucose: Secondary | ICD-10-CM | POA: Insufficient documentation

## 2022-03-04 DIAGNOSIS — M7989 Other specified soft tissue disorders: Secondary | ICD-10-CM | POA: Insufficient documentation

## 2022-03-04 DIAGNOSIS — R0602 Shortness of breath: Secondary | ICD-10-CM | POA: Insufficient documentation

## 2022-03-04 DIAGNOSIS — J45909 Unspecified asthma, uncomplicated: Secondary | ICD-10-CM | POA: Diagnosis not present

## 2022-03-04 DIAGNOSIS — R059 Cough, unspecified: Secondary | ICD-10-CM | POA: Insufficient documentation

## 2022-03-04 LAB — CBC WITH DIFFERENTIAL/PLATELET
Abs Immature Granulocytes: 0.03 10*3/uL (ref 0.00–0.07)
Basophils Absolute: 0 10*3/uL (ref 0.0–0.1)
Basophils Relative: 1 %
Eosinophils Absolute: 0.2 10*3/uL (ref 0.0–0.5)
Eosinophils Relative: 3 %
HCT: 39.5 % (ref 36.0–46.0)
Hemoglobin: 13.1 g/dL (ref 12.0–15.0)
Immature Granulocytes: 0 %
Lymphocytes Relative: 20 %
Lymphs Abs: 1.5 10*3/uL (ref 0.7–4.0)
MCH: 29.8 pg (ref 26.0–34.0)
MCHC: 33.2 g/dL (ref 30.0–36.0)
MCV: 90 fL (ref 80.0–100.0)
Monocytes Absolute: 0.6 10*3/uL (ref 0.1–1.0)
Monocytes Relative: 9 %
Neutro Abs: 4.8 10*3/uL (ref 1.7–7.7)
Neutrophils Relative %: 67 %
Platelets: 185 10*3/uL (ref 150–400)
RBC: 4.39 MIL/uL (ref 3.87–5.11)
RDW: 13.7 % (ref 11.5–15.5)
WBC: 7.2 10*3/uL (ref 4.0–10.5)
nRBC: 0 % (ref 0.0–0.2)

## 2022-03-04 LAB — TROPONIN I (HIGH SENSITIVITY): Troponin I (High Sensitivity): 3 ng/L (ref <18)

## 2022-03-04 LAB — BASIC METABOLIC PANEL
Anion gap: 7 (ref 5–15)
BUN: 15 mg/dL (ref 6–20)
CO2: 24 mmol/L (ref 22–32)
Calcium: 8.7 mg/dL — ABNORMAL LOW (ref 8.9–10.3)
Chloride: 110 mmol/L (ref 98–111)
Creatinine, Ser: 0.91 mg/dL (ref 0.44–1.00)
GFR, Estimated: >60 mL/min (ref >60)
Glucose, Bld: 136 mg/dL — ABNORMAL HIGH (ref 70–99)
Potassium: 3.6 mmol/L (ref 3.5–5.1)
Sodium: 141 mmol/L (ref 135–145)

## 2022-03-04 LAB — BRAIN NATRIURETIC PEPTIDE: B Natriuretic Peptide: 29.5 pg/mL (ref 0.0–100.0)

## 2022-03-04 NOTE — ED Triage Notes (Signed)
Pt reports increased SHOB/cough and leg swelling; she was evaluated at another ED yesterday for same; NAD noted at this time

## 2022-03-04 NOTE — Discharge Instructions (Addendum)
It was a pleasure taking care of you today!  Your labs were unremarkable today.  Your chest x-ray did not show any new findings from your chest x-ray last night.  Your ultrasound didn't show any blood clots to your leg. Call your primary care provider to set up a follow-up appointment regarding today's ED visit.  You may also speak with your primary care provider to set up a follow-up appointment with an allergist.  Continue to use your prescription medications as prescribed.  Return to the emergency department for experiencing increasing/worsening trouble breathing, pain in the chest, worsening symptoms.

## 2022-03-04 NOTE — ED Provider Notes (Signed)
[place] [facility] EMERGENCY DEPARTMENT Provider Note   CSN: [id number] Arrival date & time: 03/04/22  1244     History  Chief Complaint  Patient presents with   Shortness of Breath    Jennifer [name] is a 56 y.o. female with a past medical history of asthma who presents to the emergency department complaining of shortness of breath onset last night.  Also notes chest tightness occurring last night as well.  Notes that she has had a heart catheterization several months ago and has had intermittent shortness of breath since the catheterization.  Also has a cough that is productive with clear phlegm.  Also notes left leg swelling.  Denies anticoagulant use, estrogen use, history of DVT/PE, recent surgeries.  Has a history of uterine cancer that was treated with hysterectomy.  The history is provided by the patient. No language interpreter was used.       Home Medications Prior to Admission medications   Medication Sig Start Date End Date Taking? Authorizing Provider  ACCU-CHEK GUIDE test strip For testing blood sugars dailyDx:r73.03 06/17/21   [name], MD  amLODipine (NORVASC) 2.5 MG tablet Take by mouth. 02/15/22   [provider]  bisacodyl (DULCOLAX) 5 MG EC tablet Take by mouth.    [provider]  ciclesonide (ALVESCO) 80 MCG/ACT inhaler Inhale into the lungs. Patient not taking: Reported on 02/23/2022 12/29/21   [provider]  clotrimazole-betamethasone (LOTRISONE) cream Apply 1 application  topically 2 (two) times daily. 01/20/22   [name], MD  dicyclomine (BENTYL) 20 MG tablet Take by mouth. 02/12/22   [provider]  EPINEPHrine 0.3 mg/0.3 mL IJ SOAJ injection Use as directed for severe allergic reactions 11/10/21   Althea Charon, FNP  famotidine (PEPCID) 20 MG tablet Take by mouth. 05/06/21   [provider]  Fluticasone Furoate (ARNUITY ELLIPTA) 100 MCG/ACT AEPB Inhale 1 puff into the lungs  daily. Patient not taking: Reported on 02/23/2022 12/05/21   [provider]  Fluticasone Propionate (XHANCE) 93 MCG/ACT EXHU Place 2 puffs into both nostrils in the morning and at bedtime. 02/23/22   Althea Charon, FNP  gabapentin (NEURONTIN) 100 MG capsule Take 1 capsule (100 mg total) by mouth 2 (two) times daily. 02/17/22   [name], MD  levalbuterol [facility] HFA) 45 MCG/ACT inhaler INHALE 2 PUFFS INTO THE LUNGS EVERY 6 HOURS AS NEEDED FOR WHEEZING 11/10/21   Althea Charon, FNP  metoprolol tartrate (LOPRESSOR) 50 MG tablet Take 1 tablet (50 mg total) by mouth 2 (two) times daily. 11/24/21   [name], MD  ondansetron (ZOFRAN-ODT) 4 MG disintegrating tablet Take 1 tablet (4 mg total) by mouth every 8 (eight) hours as needed for nausea or vomiting. 12/06/21   Tegeler, Gwenyth Allegra, MD  potassium chloride (KLOR-CON) 20 MEQ packet TAKE ONE PACKET BY MOUTH ONE TIME DAILY 02/24/22   [name], MD      Allergies    Azithromycin, Ciprofloxacin, Codeine, Erythromycin base, Sulfa antibiotics, Sulfasalazine, Telmisartan, Ace inhibitors, Aspirin, Atenolol, Avelox [moxifloxacin hcl in nacl], Beta adrenergic blockers, Buspar [buspirone], Butorphanol tartrate, Cetirizine, Clonidine hcl, Cortisone, Erythromycin, Fentanyl, Fluoxetine hcl, Ketorolac tromethamine, Lidocaine, Lisinopril, Metoclopramide hcl, Midazolam, Montelukast, Montelukast sodium, Naproxen, Paroxetine, Penicillins, Pravastatin, Promethazine, Promethazine hcl, Quinolones, Serotonin reuptake inhibitors (ssris), Sertraline hcl, Stelazine [trifluoperazine], Tobramycin, Trifluoperazine hcl, Atrovent nasal spray [ipratropium], Diltiazem, Iodinated contrast media, Polyethylene glycol 3350, Propoxyphene, Adhesive [tape], Butorphanol, Ceftriaxone, Iron, Metoclopramide, Metronidazole, Other, Prednisone, Prochlorperazine, Venlafaxine, and Zyrtec [cetirizine hcl]  Review of Systems   Review of Systems  Respiratory:   Positive for cough, chest tightness and shortness of breath.   Musculoskeletal:  Positive for joint swelling. Negative for arthralgias.  All other systems reviewed and are negative.   Physical Exam Updated Vital Signs BP (!) 158/90   Pulse 97   Temp 98.1 F (36.7 C) (Oral)   Resp (!) 22   Ht '5\' 2"'$  (1.575 m)   Wt 108 kg   LMP 06/25/2013   SpO2 99%   BMI 43.53 kg/m  Physical Exam Vitals and nursing note reviewed.  Constitutional:      General: She is not in acute distress.    Appearance: She is not diaphoretic.     Comments: Able to speak clear complete sentences without difficulty.  HENT:     Head: Normocephalic and atraumatic.     Mouth/Throat:     Pharynx: No oropharyngeal exudate.  Eyes:     General: No scleral icterus.    Conjunctiva/sclera: Conjunctivae normal.  Cardiovascular:     Rate and Rhythm: Normal rate and regular rhythm.     Pulses: Normal pulses.     Heart sounds: Normal heart sounds.  Pulmonary:     Effort: Pulmonary effort is normal. No respiratory distress.     Breath sounds: Normal breath sounds. No wheezing.  Abdominal:     General: Bowel sounds are normal.     Palpations: Abdomen is soft. There is no mass.     Tenderness: There is no abdominal tenderness. There is no guarding or rebound.  Musculoskeletal:        General: Normal range of motion.     Cervical back: Normal range of motion and neck supple.     Comments: Trace pitting edema noted to LLE. Pedal pulses intact bilaterally.   Skin:    General: Skin is warm and dry.  Neurological:     Mental Status: She is alert.  Psychiatric:        Behavior: Behavior normal.     ED Results / Procedures / Treatments   Labs (all labs ordered are listed, but only abnormal results are displayed) Labs Reviewed  BASIC METABOLIC PANEL - Abnormal; Notable for the following components:      Result Value   Glucose, Bld 136 (*)    Calcium 8.7 (*)    All other components within normal limits  CBC WITH  DIFFERENTIAL/PLATELET  BRAIN NATRIURETIC PEPTIDE  TROPONIN I (HIGH SENSITIVITY)    EKG EKG Interpretation  Date/Time:  Saturday March 04 2022 12:53:19 EDT Ventricular Rate:  94 PR Interval:  134 QRS Duration: 68 QT Interval:  336 QTC Calculation: 420 R Axis:   31 Text Interpretation: Normal sinus rhythm Normal ECG When compared with ECG of 10-Feb-2022 10:08, PREVIOUS ECG IS PRESENT Confirmed by Regan Lemming ([id number]) on 03/04/2022 1:31:13 PM  Radiology DG Chest 2 View  Result Date: 03/04/2022 CLINICAL DATA:  Shortness of breath.  Cough.  Leg swelling. EXAM: CHEST - 2 VIEW COMPARISON:  AP chest 02/01/2022 FINDINGS: Cardiac silhouette and mediastinal contours are within normal limits. Mild right-greater-than-left linear likely subsegmental atelectasis. No definite focal airspace opacity to indicate pneumonia, and the lower lungs are clear on lateral view. No pleural effusion or pneumothorax. Mild-to-moderate multilevel degenerative disc changes of the thoracic spine. Cholecystectomy clips. IMPRESSION: Right-greater-than-left lower lung linear likely subsegmental atelectasis seen on frontal view. No definite focal airspace opacity to indicate pneumonia. Electronically Signed   By: Viann Fish.D.  On: 03/04/2022 15:04   US Venous Img Lower  Left (DVT Study)  Result Date: 03/04/2022 CLINICAL DATA:  Shortness of breath leg pain EXAM: LEFT LOWER EXTREMITY VENOUS DOPPLER ULTRASOUND TECHNIQUE: Gray-scale sonography with compression, as well as color and duplex ultrasound, were performed to evaluate the deep venous system(s) from the level of the common femoral vein through the popliteal and proximal calf veins. COMPARISON:  None Available. FINDINGS: VENOUS Normal compressibility of the common femoral, superficial femoral, and popliteal veins, as well as the visualized calf veins. Visualized portions of profunda femoral vein and great saphenous vein unremarkable. No filling defects to suggest DVT on  grayscale or color Doppler imaging. Doppler waveforms show normal direction of venous flow, normal respiratory plasticity and response to augmentation. Limited views of the contralateral common femoral vein are unremarkable. OTHER None. Limitations: none IMPRESSION: Negative examination for deep venous thrombosis in the left lower extremity. Electronically Signed   By: Delanna Ahmadi M.D.   On: 03/04/2022 15:02    Procedures Procedures    Medications Ordered in ED Medications - No data to display  ED Course/ Medical Decision Making/ A&P Clinical Course as of 03/04/22 1718  Sat Mar 04, 2022  1607 Re-evaluated and patient resting comfortably on stretcher. Discussed with patient lab findings. Pt  [SB]  1700 Discussed discharge treatment plan with patient at bedside. Answered all available questions. Pt appears safe for discharge at this time. [SB]    Clinical Course User Index [SB] Delorean Knutzen A, PA-C                           Medical Decision Making Amount and/or Complexity of Data Reviewed Labs: ordered. Radiology: ordered.   Patient presents to the ED complaining of shortness of breath onset last night.  Patient afebrile, not tachycardic or hypoxic.  Patient well-appearing during exam today.  Able to speak in clear complete sentences.  No acute cardiovascular respiratory exam findings.  Patient with trace edema noted to left lower extremity.  Pedal pulses intact.  Differential diagnosis includes ACS, pneumothorax, pneumonia, viral URI.   Additional history obtained:  External records from outside source obtained and reviewed including: Patient was evaluated at [facility] last night for similar concerns.  Had a negative work-up at that time.  Labs:  I ordered, and personally interpreted labs.  The pertinent results include:   Initial troponin at 3.  Troponin from last night at [facility] was at 4.   BNP at 29.5 and unremarkable. BMP with slightly elevated glucose  at 136 otherwise unremarkable. CBC unremarkable  Imaging: I ordered imaging studies including CXR, US DVT I independently visualized and interpreted imaging which showed: no acute pneumonia noted on CXR. DVT study without findings for DVT to LLE I agree with the radiologist interpretation   Disposition: Presentation suspicious for shortness of breath.  Doubt ACS, pneumonia, pneumothorax at this time.  Doubt DVT at this time.  Patient noted to be able to ambulate out of the emergency department without difficulty.  After consideration of the diagnostic results and the patients response to treatment, I feel that the patient would benefit from Discharge home.  Patient instructed to follow-up with primary care provider where they may get her in line with an allergist as needed for her symptoms.  Discussed with patient she can continue taking her medications as prescribed. Supportive care measures and strict return precautions discussed with patient at bedside. Pt acknowledges and verbalizes  understanding. Pt appears safe for discharge. Follow up as indicated in discharge paperwork.    This chart was dictated using voice recognition software, Dragon. Despite the best efforts of this provider to proofread and correct errors, errors may still occur which can change documentation meaning.  Final Clinical Impression(s) / ED Diagnoses Final diagnoses:  SOB (shortness of breath)    Rx / DC Orders ED Discharge Orders     None         Darnel Mchan A, PA-C 03/04/22 1829    Lucrezia Starch, MD 03/04/22 2030

## 2022-03-04 NOTE — ED Notes (Signed)
Patient states left leg is swelling since she had her heart cath in June.States she gets shortness of breath  with activity and at rest.States that she is having cough . States that she is coughing up clear phlegm

## 2022-03-04 NOTE — ED Notes (Signed)
Patient transported to X-ray 

## 2022-03-04 NOTE — ED Notes (Signed)
Rn attempted iv stick she was unsuccesful

## 2022-03-04 NOTE — ED Notes (Signed)
Pt refused IV, requested to have labs only. EDP notified

## 2022-03-05 IMAGING — CR DG CHEST 2V
2 series · 2 of 2 positions shown · non-contrast
Comparison: Chest radiographs 12/06/2020 and earlier.

CLINICAL DATA: 55-year-old female with bloating, squeezing
sensation in the chest and abdomen.

EXAM:
CHEST - 2 VIEW

[w chest pa]
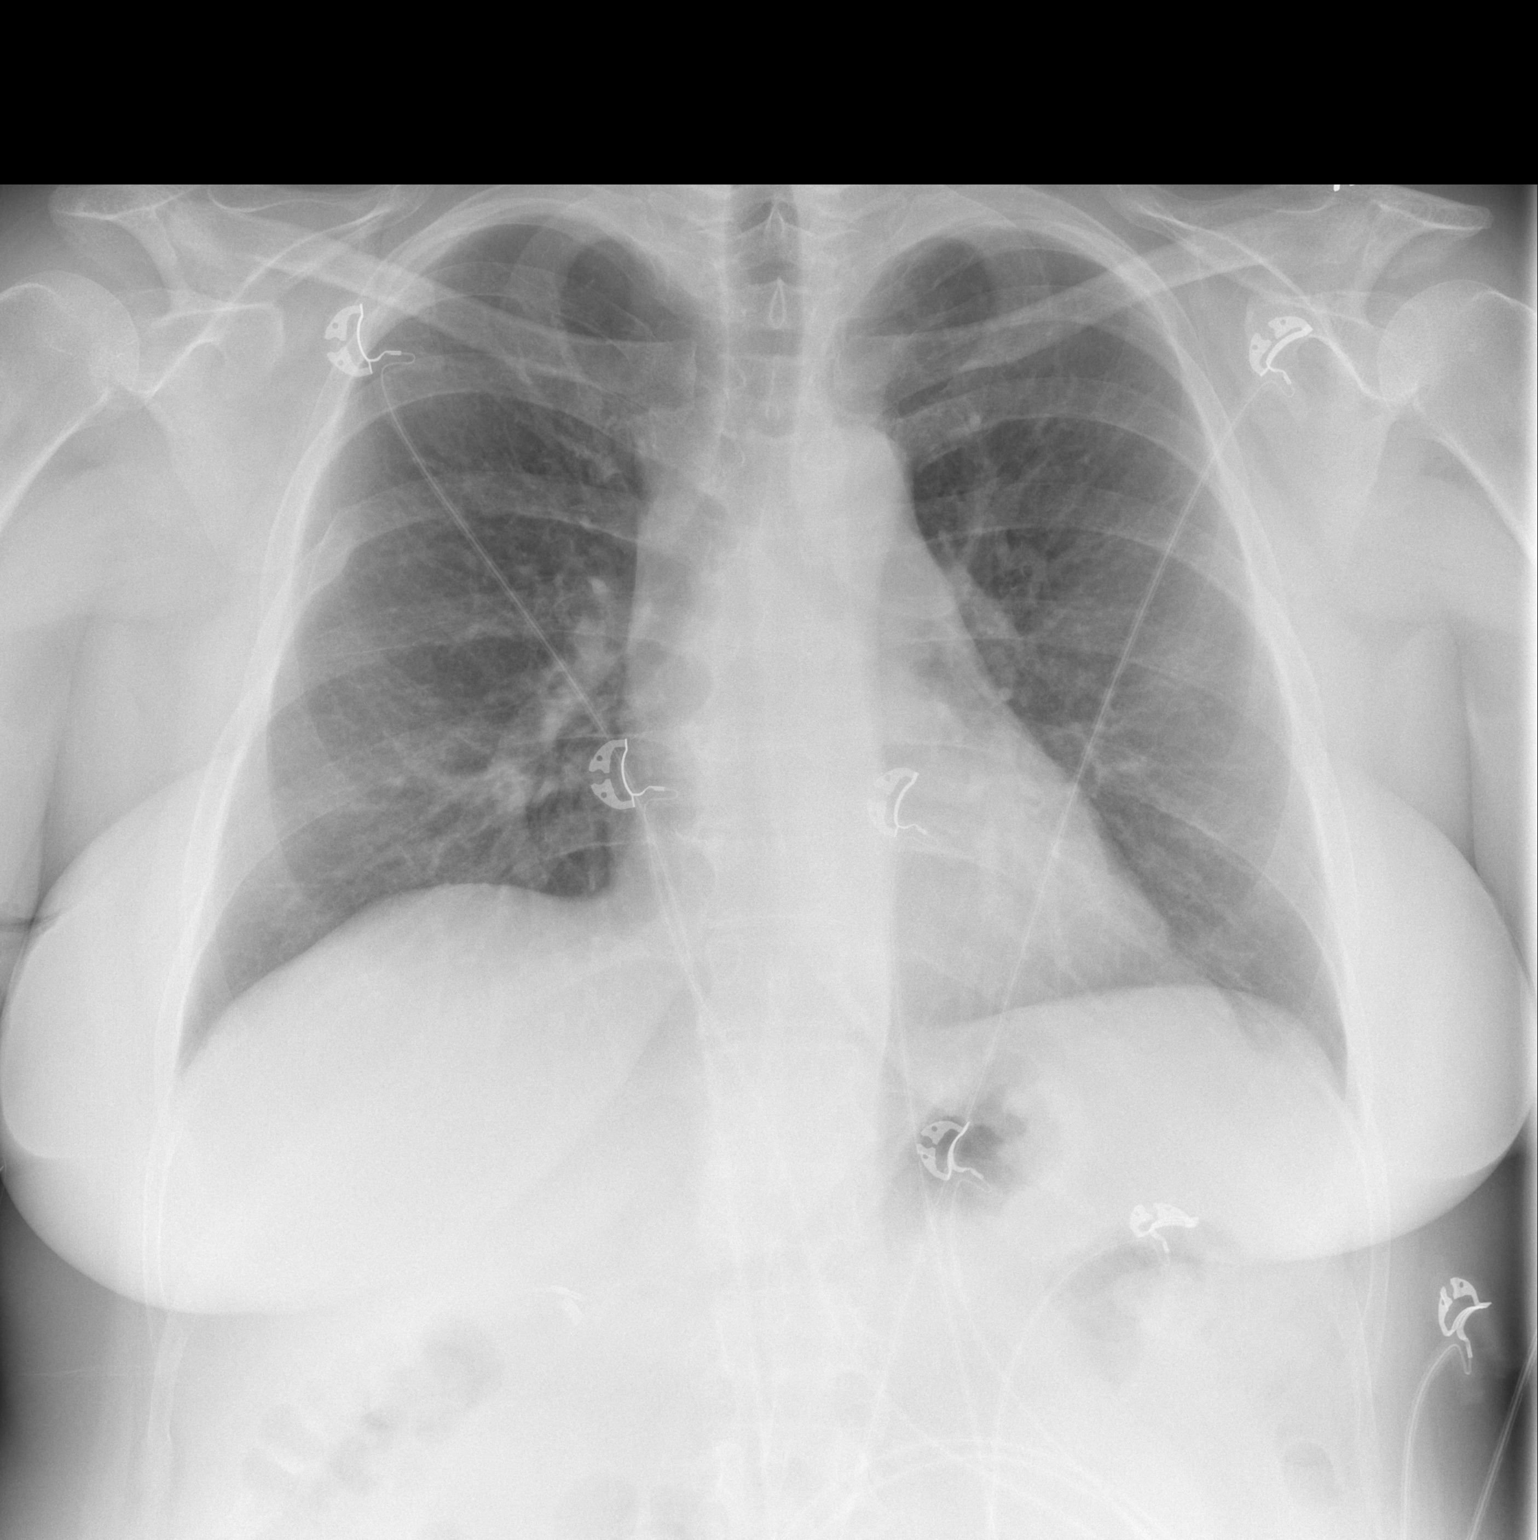

[w chest lat]
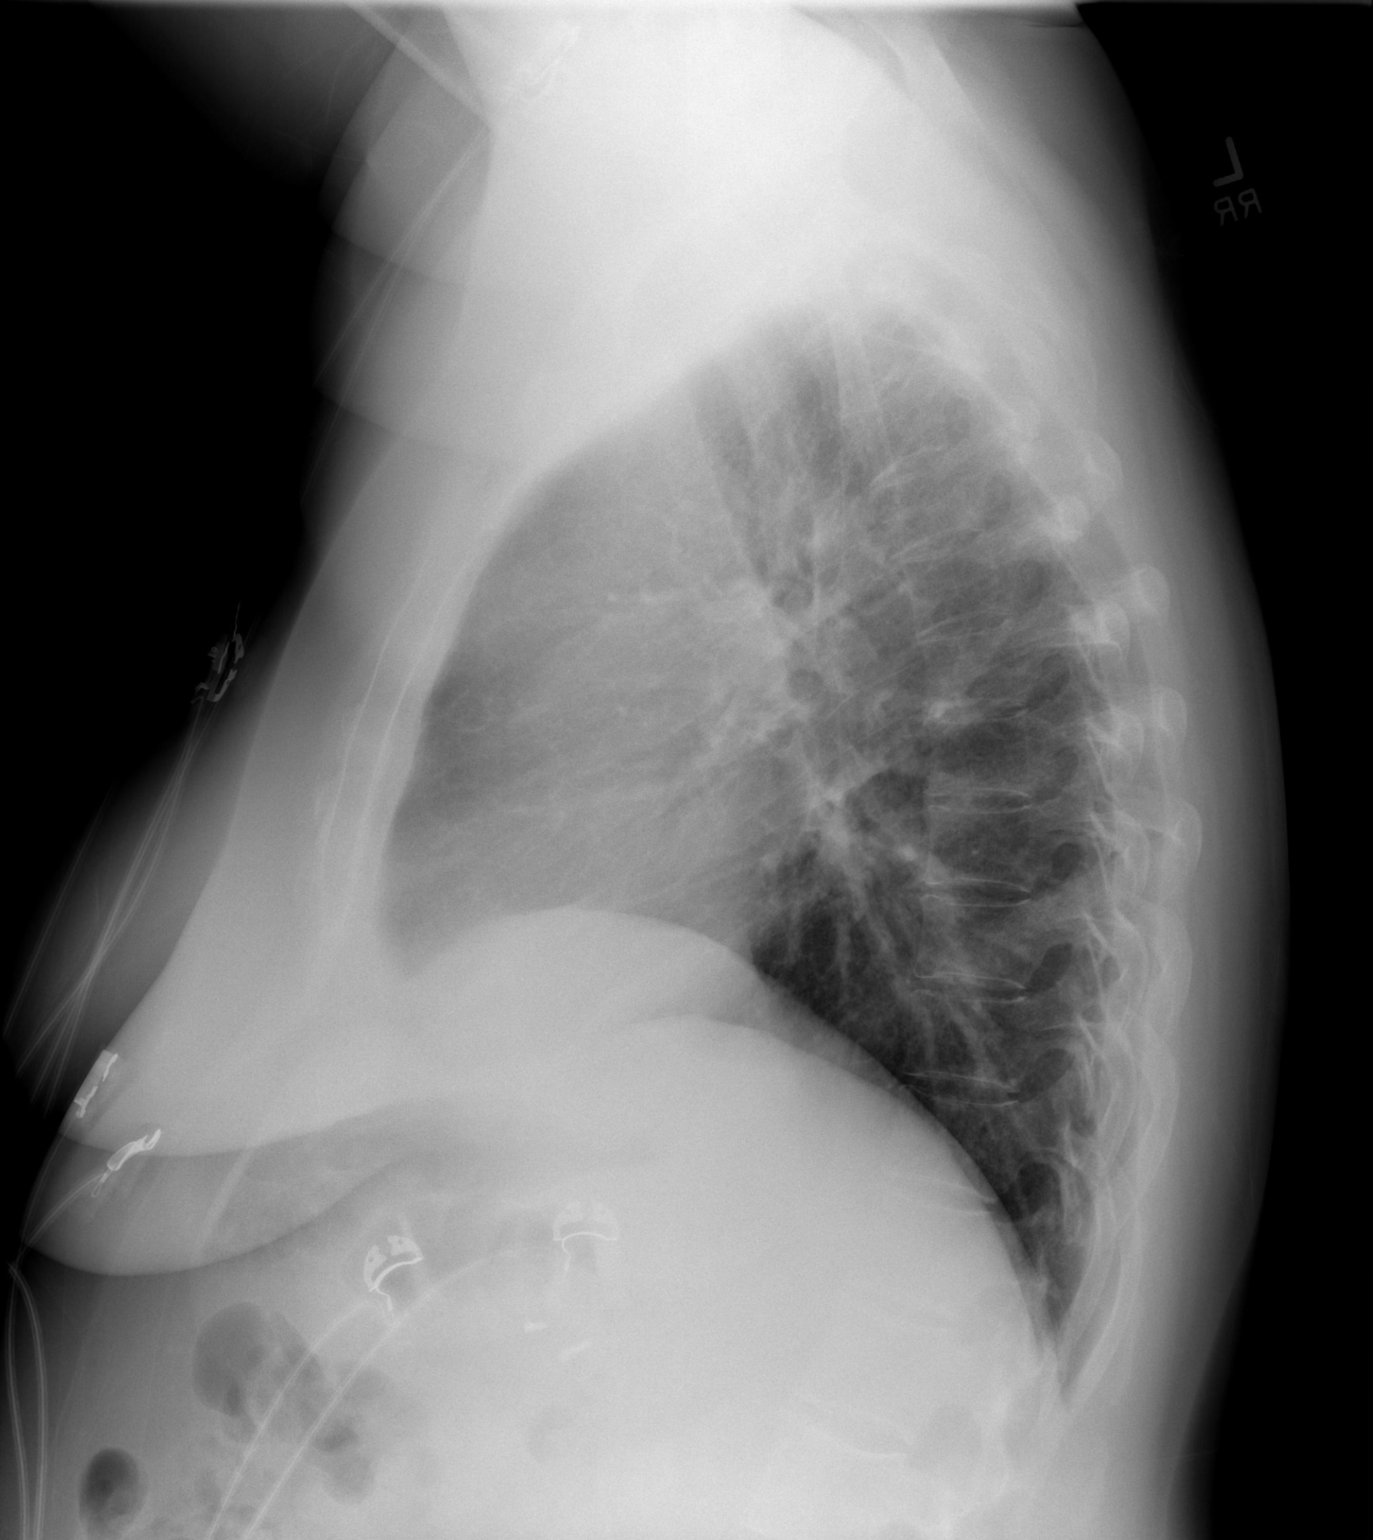

[2 of 2 positions shown; findings below may reference images not displayed]

FINDINGS: Lung volumes and mediastinal contours are stable and within normal
limits. Visualized tracheal air column is within normal limits. Both
lungs appear clear. No pneumothorax or pleural effusion. Chronic
right 6th rib fracture. No acute osseous abnormality identified.
Stable cholecystectomy clips. Negative visible bowel gas pattern.
IMPRESSION: No cardiopulmonary abnormality.

## 2022-03-05 IMAGING — CR DG ABDOMEN 1V
1 series · 1 of 1 positions shown · non-contrast
Comparison: Abdominal radiographs 01/24/2020 and earlier. [facility]] CT Abdomen and Pelvis 11/04/2020

CLINICAL DATA: 55-year-old female with bloating, squeezing
sensation in the chest and abdomen.

EXAM:
ABDOMEN - 1 VIEW

[t abdomen supine]
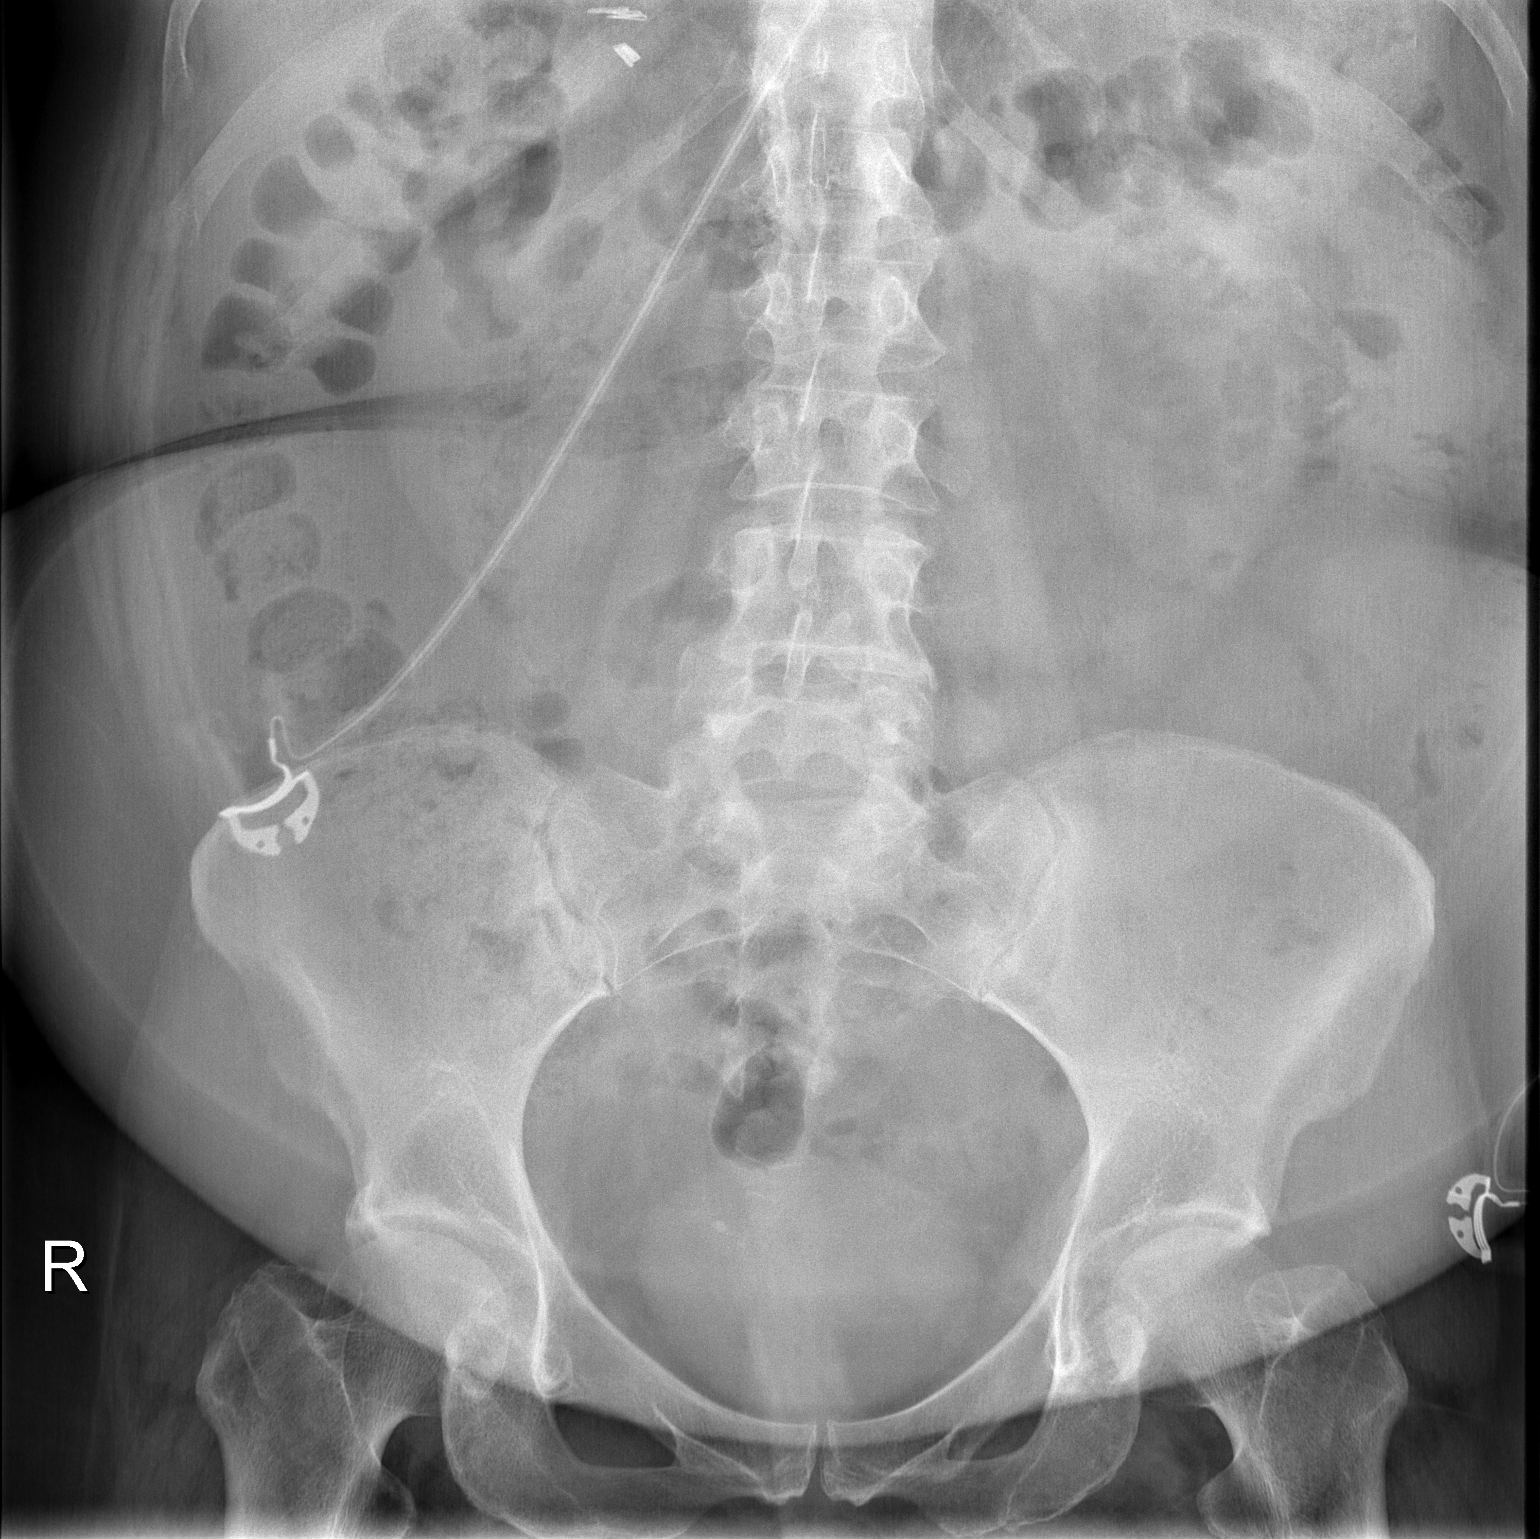

[1 of 1 positions shown; findings below may reference images not displayed]

FINDINGS: Stable cholecystectomy clips. Nonobstructed bowel gas pattern,
stable from the CT last month. Diaphragm, lung bases not included on
this single image. Visible abdominal and pelvic visceral contours
appear stable and negative. No osseous abnormality identified.
IMPRESSION: Negative.

## 2022-03-06 ENCOUNTER — Telehealth: Payer: Self-pay

## 2022-03-06 DIAGNOSIS — R1010 Upper abdominal pain, unspecified: Secondary | ICD-10-CM | POA: Diagnosis not present

## 2022-03-06 DIAGNOSIS — Z79899 Other long term (current) drug therapy: Secondary | ICD-10-CM | POA: Diagnosis not present

## 2022-03-06 DIAGNOSIS — R6884 Jaw pain: Secondary | ICD-10-CM | POA: Diagnosis not present

## 2022-03-06 DIAGNOSIS — I10 Essential (primary) hypertension: Secondary | ICD-10-CM | POA: Diagnosis not present

## 2022-03-06 DIAGNOSIS — R1084 Generalized abdominal pain: Secondary | ICD-10-CM | POA: Diagnosis not present

## 2022-03-06 DIAGNOSIS — R000 Tachycardia, unspecified: Secondary | ICD-10-CM | POA: Diagnosis not present

## 2022-03-06 DIAGNOSIS — M25552 Pain in left hip: Secondary | ICD-10-CM | POA: Diagnosis not present

## 2022-03-06 LAB — FUNGAL STAIN
FUNGAL SMEAR:: NONE SEEN
MICRO NUMBER:: 13682233
SPECIMEN QUALITY:: ADEQUATE

## 2022-03-06 LAB — VITAMIN B1: VITAMIN B12: 109.9

## 2022-03-06 NOTE — Telephone Encounter (Signed)
Thank you.Tell her sorry that I am sorry that Pulmicort was not sent in at her last office visit. For asthma flare, begin Pulmicort (budesonide) 0.5 mg once a day via nebulizer for 2 weeks or until cough and wheeze free. Please tell her to let us know if she is not getting any better.

## 2022-03-06 NOTE — Telephone Encounter (Signed)
Left patient a message to start her pulmicort 0.5 mg once daily for 2 weeks until cough or wheeze free. Xopenex hfa as needed is well.

## 2022-03-06 NOTE — Telephone Encounter (Signed)
Pulmicort 0.5 mg for nebulizer wasn't sent into [facility]. I will send that in and she isn't doing the xopenex 1.25 mg for the machine bc she's not sure if it is in date. I will send that in also. She doesn't have a fever. She's only doing her xopenex hfa.

## 2022-03-06 NOTE — Telephone Encounter (Signed)
Sent in her pulmicort 0.5 mg and her xopenex 1.25 mg. She said the xhance was helping her at first, now she is stopped up she feels it's bc of the steroids. I told her to do her nose rinses and only use the xhance as needed. Will let her know to do the pulmicort 0.'5mg'$  once daily for 2 weeks.

## 2022-03-06 NOTE — Telephone Encounter (Signed)
Has she started using Pulmicort 0.5 mg via nebulizer once a day? Is she using her Xopenex? How often is she using Xopenex? Does Xopenex help the cough? Any fever?

## 2022-03-06 NOTE — Telephone Encounter (Signed)
Thank you :)

## 2022-03-06 NOTE — Telephone Encounter (Signed)
Patient called this morning she has been coughing for the last couple of days. She did go to the ED the 21st, 22cd. Chest xray was done. Results in care everywhere. 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 Please advise.

## 2022-03-06 NOTE — Progress Notes (Signed)
Hi Jennifer [name], skin scraping negative for fungus I want you to continue to apply a heavy moisturizing cream, not a lotion, twice a day to your feet.  I recommend Cereve because it can help remove some of the dead skin and scale as well as moisturizing.  I want to try this at least for a month and see if you notice improvement in your feet.

## 2022-03-10 DIAGNOSIS — S93401A Sprain of unspecified ligament of right ankle, initial encounter: Secondary | ICD-10-CM | POA: Diagnosis not present

## 2022-03-10 DIAGNOSIS — I10 Essential (primary) hypertension: Secondary | ICD-10-CM | POA: Diagnosis not present

## 2022-03-10 DIAGNOSIS — M25511 Pain in right shoulder: Secondary | ICD-10-CM | POA: Diagnosis not present

## 2022-03-10 DIAGNOSIS — X58XXXA Exposure to other specified factors, initial encounter: Secondary | ICD-10-CM | POA: Diagnosis not present

## 2022-03-10 DIAGNOSIS — Y999 Unspecified external cause status: Secondary | ICD-10-CM | POA: Diagnosis not present

## 2022-03-10 DIAGNOSIS — M25571 Pain in right ankle and joints of right foot: Secondary | ICD-10-CM | POA: Diagnosis not present

## 2022-03-10 DIAGNOSIS — G8929 Other chronic pain: Secondary | ICD-10-CM | POA: Diagnosis not present

## 2022-03-10 DIAGNOSIS — M19071 Primary osteoarthritis, right ankle and foot: Secondary | ICD-10-CM | POA: Diagnosis not present

## 2022-03-11 DIAGNOSIS — I517 Cardiomegaly: Secondary | ICD-10-CM | POA: Diagnosis not present

## 2022-03-11 DIAGNOSIS — G8929 Other chronic pain: Secondary | ICD-10-CM | POA: Diagnosis not present

## 2022-03-11 DIAGNOSIS — M25511 Pain in right shoulder: Secondary | ICD-10-CM | POA: Diagnosis not present

## 2022-03-11 DIAGNOSIS — S93401A Sprain of unspecified ligament of right ankle, initial encounter: Secondary | ICD-10-CM | POA: Diagnosis not present

## 2022-03-11 DIAGNOSIS — R000 Tachycardia, unspecified: Secondary | ICD-10-CM | POA: Diagnosis not present

## 2022-03-11 DIAGNOSIS — Y999 Unspecified external cause status: Secondary | ICD-10-CM | POA: Diagnosis not present

## 2022-03-11 DIAGNOSIS — X58XXXA Exposure to other specified factors, initial encounter: Secondary | ICD-10-CM | POA: Diagnosis not present

## 2022-03-12 DIAGNOSIS — R000 Tachycardia, unspecified: Secondary | ICD-10-CM | POA: Diagnosis not present

## 2022-03-13 DIAGNOSIS — G4733 Obstructive sleep apnea (adult) (pediatric): Secondary | ICD-10-CM | POA: Diagnosis not present

## 2022-03-13 DIAGNOSIS — E2609 Other primary hyperaldosteronism: Secondary | ICD-10-CM | POA: Diagnosis not present

## 2022-03-13 DIAGNOSIS — I10 Essential (primary) hypertension: Secondary | ICD-10-CM | POA: Diagnosis not present

## 2022-03-13 DIAGNOSIS — E876 Hypokalemia: Secondary | ICD-10-CM | POA: Diagnosis not present

## 2022-03-13 DIAGNOSIS — I493 Ventricular premature depolarization: Secondary | ICD-10-CM | POA: Diagnosis not present

## 2022-03-15 ENCOUNTER — Telehealth: Payer: Self-pay | Admitting: Family

## 2022-03-15 ENCOUNTER — Emergency Department (HOSPITAL_BASED_OUTPATIENT_CLINIC_OR_DEPARTMENT_OTHER): Payer: Medicare Other

## 2022-03-15 ENCOUNTER — Encounter (HOSPITAL_BASED_OUTPATIENT_CLINIC_OR_DEPARTMENT_OTHER): Payer: Self-pay | Admitting: Urology

## 2022-03-15 ENCOUNTER — Other Ambulatory Visit: Payer: Self-pay

## 2022-03-15 ENCOUNTER — Emergency Department (HOSPITAL_BASED_OUTPATIENT_CLINIC_OR_DEPARTMENT_OTHER)
Admission: EM | Admit: 2022-03-15 | Discharge: 2022-03-15 | Disposition: A | Discharge status: Home / Self Care | Payer: Medicare Other | Attending: Emergency Medicine | Admitting: Emergency Medicine

## 2022-03-15 DIAGNOSIS — R1010 Upper abdominal pain, unspecified: Secondary | ICD-10-CM | POA: Diagnosis not present

## 2022-03-15 DIAGNOSIS — J9811 Atelectasis: Secondary | ICD-10-CM | POA: Diagnosis not present

## 2022-03-15 DIAGNOSIS — R002 Palpitations: Secondary | ICD-10-CM | POA: Insufficient documentation

## 2022-03-15 DIAGNOSIS — Z79899 Other long term (current) drug therapy: Secondary | ICD-10-CM | POA: Diagnosis not present

## 2022-03-15 DIAGNOSIS — R69 Illness, unspecified: Secondary | ICD-10-CM | POA: Diagnosis not present

## 2022-03-15 DIAGNOSIS — R0602 Shortness of breath: Secondary | ICD-10-CM | POA: Diagnosis not present

## 2022-03-15 DIAGNOSIS — K0889 Other specified disorders of teeth and supporting structures: Secondary | ICD-10-CM | POA: Insufficient documentation

## 2022-03-15 LAB — POTASSIUM: Potassium: 3.4 mmol/L — ABNORMAL LOW (ref 3.5–5.1)

## 2022-03-15 MED ORDER — POTASSIUM CHLORIDE 20 MEQ PO PACK: 40.0000 meq | PACK | Freq: Every day | ORAL | Status: DC

## 2022-03-15 MED ORDER — POTASSIUM CHLORIDE 20 MEQ PO PACK: 40.0000 meq | PACK | Freq: Once | ORAL | Status: DC

## 2022-03-15 MED ORDER — ACETAMINOPHEN 325 MG PO TABS: 650.0000 mg | ORAL_TABLET | Freq: Once | ORAL | Status: DC

## 2022-03-15 NOTE — Telephone Encounter (Signed)
Patient is having labored breathing and coughing asking a nurse to call her and go over her current medications to see if an adjustment can be made to help with her breathing her Pulse OX is staying at 92 please advise

## 2022-03-15 NOTE — ED Provider Notes (Addendum)
[place] EMERGENCY DEPARTMENT Provider Note   CSN: [id number] Arrival date & time: 03/15/22  2041     History  Chief Complaint  Patient presents with   Illness    Jennifer [name] is a 56 y.o. female.  Patient here with multiple complaints.  Mostly concerned about high heart rate.  History of the same.  Denies any chest pain or active shortness of breath.  Also reports some dental pain and some chronic upper abdominal pain.  Overall she denies any fever, chills.  No current chest pain or shortness of breath or abdominal pain.  Takes metoprolol.  Nothing makes it worse or better.        Home Medications Prior to Admission medications   Medication Sig Start Date End Date Taking? Authorizing Provider  ACCU-CHEK GUIDE test strip For testing blood sugars dailyDx:r73.03 06/17/21   [name], MD  amLODipine (NORVASC) 2.5 MG tablet Take by mouth. 02/15/22   [provider]  bisacodyl (DULCOLAX) 5 MG EC tablet Take by mouth.    [provider]  ciclesonide (ALVESCO) 80 MCG/ACT inhaler Inhale into the lungs. Patient not taking: Reported on 02/23/2022 12/29/21   [provider]  clotrimazole-betamethasone (LOTRISONE) cream Apply 1 application  topically 2 (two) times daily. 01/20/22   [name], MD  dicyclomine (BENTYL) 20 MG tablet Take by mouth. 02/12/22   [provider]  EPINEPHrine 0.3 mg/0.3 mL IJ SOAJ injection Use as directed for severe allergic reactions 11/10/21   Althea Charon, FNP  famotidine (PEPCID) 20 MG tablet Take by mouth. 05/06/21   [provider]  Fluticasone Furoate (ARNUITY ELLIPTA) 100 MCG/ACT AEPB Inhale 1 puff into the lungs daily. Patient not taking: Reported on 02/23/2022 12/05/21   [provider]  Fluticasone Propionate (XHANCE) 93 MCG/ACT EXHU Place 2 puffs into both nostrils in the morning and at bedtime. 02/23/22   Althea Charon, FNP  gabapentin (NEURONTIN) 100 MG capsule Take  1 capsule (100 mg total) by mouth 2 (two) times daily. 02/17/22   [name], MD  levalbuterol [facility] HFA) 45 MCG/ACT inhaler INHALE 2 PUFFS INTO THE LUNGS EVERY 6 HOURS AS NEEDED FOR WHEEZING 11/10/21   Althea Charon, FNP  metoprolol tartrate (LOPRESSOR) 50 MG tablet Take 1 tablet (50 mg total) by mouth 2 (two) times daily. 11/24/21   [name], MD  ondansetron (ZOFRAN-ODT) 4 MG disintegrating tablet Take 1 tablet (4 mg total) by mouth every 8 (eight) hours as needed for nausea or vomiting. 12/06/21   Tegeler, Gwenyth Allegra, MD  potassium chloride (KLOR-CON) 20 MEQ packet TAKE ONE PACKET BY MOUTH ONE TIME DAILY 02/24/22   [name], MD      Allergies    Azithromycin, Ciprofloxacin, Codeine, Erythromycin base, Sulfa antibiotics, Sulfasalazine, Telmisartan, Ace inhibitors, Aspirin, Atenolol, Avelox [moxifloxacin hcl in nacl], Beta adrenergic blockers, Buspar [buspirone], Butorphanol tartrate, Cetirizine, Clonidine hcl, Cortisone, Erythromycin, Fentanyl, Fluoxetine hcl, Ketorolac tromethamine, Lidocaine, Lisinopril, Metoclopramide hcl, Midazolam, Montelukast, Montelukast sodium, Naproxen, Paroxetine, Penicillins, Pravastatin, Promethazine, Promethazine hcl, Quinolones, Serotonin reuptake inhibitors (ssris), Sertraline hcl, Stelazine [trifluoperazine], Tobramycin, Trifluoperazine hcl, Atrovent nasal spray [ipratropium], Diltiazem, Iodinated contrast media, Polyethylene glycol 3350, Propoxyphene, Adhesive [tape], Butorphanol, Ceftriaxone, Iron, Metoclopramide, Metronidazole, Other, Prednisone, Prochlorperazine, Venlafaxine, and Zyrtec [cetirizine hcl]    Review of Systems   Review of Systems  Physical Exam Updated Vital Signs BP (!) 179/110 (BP Location: Left Arm)   Pulse (!) 103   Temp 98.4 F (36.9 C)   Resp 20  Ht '5\' 2"'$  (1.575 m)   Wt 108 kg   LMP 06/25/2013   SpO2 99%   BMI 43.53 kg/m  Physical Exam Vitals and nursing note reviewed.  Constitutional:       General: She is not in acute distress.    Appearance: She is well-developed.  HENT:     Head: Normocephalic and atraumatic.     Nose: Nose normal.     Mouth/Throat:     Mouth: Mucous membranes are moist.  Eyes:     Extraocular Movements: Extraocular movements intact.     Conjunctiva/sclera: Conjunctivae normal.     Pupils: Pupils are equal, round, and reactive to light.  Cardiovascular:     Rate and Rhythm: Normal rate and regular rhythm.     Pulses: Normal pulses.     Heart sounds: Normal heart sounds. No murmur heard. Pulmonary:     Effort: Pulmonary effort is normal. No respiratory distress.     Breath sounds: Normal breath sounds.  Abdominal:     Palpations: Abdomen is soft.     Tenderness: There is no abdominal tenderness.  Musculoskeletal:        General: No swelling.     Cervical back: Neck supple.  Skin:    General: Skin is warm and dry.     Capillary Refill: Capillary refill takes less than 2 seconds.  Neurological:     Mental Status: She is alert.  Psychiatric:        Mood and Affect: Mood normal.     ED Results / Procedures / Treatments   Labs (all labs ordered are listed, but only abnormal results are displayed) Labs Reviewed - No data to display  EKG None  Radiology DG Chest Portable 1 View  Result Date: 03/15/2022 CLINICAL DATA:  Shortness of breath. Technologist notes state elevated heart rate. EXAM: PORTABLE CHEST 1 VIEW COMPARISON:  Frontal and lateral views 03/04/2022 FINDINGS: The cardiomediastinal contours are normal. Improved atelectasis from prior. Pulmonary vasculature is normal. No consolidation, pleural effusion, or pneumothorax. Remote posterior right rib fracture. No acute osseous abnormalities are seen. IMPRESSION: Improved atelectasis from prior exam.  No acute findings. Electronically Signed   By: Keith Rake M.D.   On: 03/15/2022 21:05    Procedures Procedures    Medications Ordered in ED Medications - No data to display  ED  Course/ Medical Decision Making/ A&P                           Medical Decision Making Amount and/or Complexity of Data Reviewed Labs: ordered. Radiology: ordered.  Risk OTC drugs. Prescription drug management.   KRYSTALYNN RIDGEWAY is here for palpitations.  EKG per my review and interpretation shows sinus rhythm.  No ischemic changes.  Overall she is well-appearing.  Vital signs are normal.  Chest x-ray was ordered that showed no signs of pneumonia or pneumothorax.  Patient has frequent visits for similar symptoms.  She states that she is having palpitations and talk to her cardiologist about taking extra doses of medicine as needed.  She feels like she is having some COPD/asthma symptoms and try calling those doctors.    She has an ED care plan due to increased frequency of visits for similar type complaints.  Her exam is overall reassuring.  I offered her breathing treatment and a dose of Decadron and she declined.  I do not think there is any emergent process at this time.  I have  no concern for ACS or PE or intra-abdominal process.  Discharge patient.  Patient would not leave the ED and last checked her potassium.  She states that she supposed to have her potassium checked weekly.  It was 3.4.  I gave her an oral dose of potassium.  She was still unhappy with care.  I do not think there is any acute emergency process at this time.  She has had several different symptoms while being here and nothing is consistent.  Overall I suspect there is an aspect of malingering.  Her vital signs are normal.  She is well-appearing and overall I do not have any concern for any need for further work-up.  This chart was dictated using voice recognition software.  Despite best efforts to proofread,  errors can occur which can change the documentation meaning.         Final Clinical Impression(s) / ED Diagnoses Final diagnoses:  Palpitations    Rx / DC Orders ED Discharge Orders     None          Lennice Sites, DO 03/15/22 2108    Lennice Sites, DO 03/15/22 2232

## 2022-03-15 NOTE — ED Notes (Signed)
Patient continues to argue with this RN when attempting to discharge.  Spoke to patient and informed her that MD was made aware of her concerns.  Remains upset and doesn't want to be discharged without lab work.

## 2022-03-15 NOTE — ED Notes (Signed)
Patient upset at time of discharge.  Requesting to have blood work drawn to "check my potassium level."  Informed patient that MD did not feel that lab work was needed at this time.  Remains upset.

## 2022-03-15 NOTE — ED Notes (Signed)
Patient left prior to medication administration.  MD aware.  Unable to retake vitals prior to patient leaving department

## 2022-03-15 NOTE — Discharge Instructions (Addendum)
Your heart rate is normal.  Talk with your cardiologist for any medication adjustments.  I recommend he take a dose of Decadron at home.  There is no signs of pneumonia or volume overload or acute pulmonary process at this time.  Recommend on focusing on your breathing treatments at home and taking Decadron.

## 2022-03-15 NOTE — ED Triage Notes (Signed)
Pt states HR high since yesterday, states epigastric pain and dental pain since yesterday  Also reports some SOB as well

## 2022-03-16 DIAGNOSIS — R000 Tachycardia, unspecified: Secondary | ICD-10-CM | POA: Diagnosis not present

## 2022-03-16 DIAGNOSIS — R0789 Other chest pain: Secondary | ICD-10-CM | POA: Diagnosis not present

## 2022-03-16 DIAGNOSIS — E119 Type 2 diabetes mellitus without complications: Secondary | ICD-10-CM | POA: Diagnosis not present

## 2022-03-16 DIAGNOSIS — Z888 Allergy status to other drugs, medicaments and biological substances status: Secondary | ICD-10-CM | POA: Diagnosis not present

## 2022-03-16 DIAGNOSIS — Z91018 Allergy to other foods: Secondary | ICD-10-CM | POA: Diagnosis not present

## 2022-03-16 DIAGNOSIS — R079 Chest pain, unspecified: Secondary | ICD-10-CM | POA: Diagnosis not present

## 2022-03-16 DIAGNOSIS — Z87891 Personal history of nicotine dependence: Secondary | ICD-10-CM | POA: Diagnosis not present

## 2022-03-16 DIAGNOSIS — I10 Essential (primary) hypertension: Secondary | ICD-10-CM | POA: Diagnosis not present

## 2022-03-16 DIAGNOSIS — Z882 Allergy status to sulfonamides status: Secondary | ICD-10-CM | POA: Diagnosis not present

## 2022-03-16 DIAGNOSIS — Z91010 Allergy to peanuts: Secondary | ICD-10-CM | POA: Diagnosis not present

## 2022-03-16 DIAGNOSIS — G35 Multiple sclerosis: Secondary | ICD-10-CM | POA: Diagnosis not present

## 2022-03-16 DIAGNOSIS — K219 Gastro-esophageal reflux disease without esophagitis: Secondary | ICD-10-CM | POA: Diagnosis not present

## 2022-03-16 DIAGNOSIS — Z880 Allergy status to penicillin: Secondary | ICD-10-CM | POA: Diagnosis not present

## 2022-03-16 DIAGNOSIS — R1013 Epigastric pain: Secondary | ICD-10-CM | POA: Diagnosis not present

## 2022-03-16 DIAGNOSIS — Z87892 Personal history of anaphylaxis: Secondary | ICD-10-CM | POA: Diagnosis not present

## 2022-03-16 NOTE — Telephone Encounter (Signed)
Called pt to address message, pt mentioned that she was able to go to the ER and chest x-ray was done and it reveal some improvement from previous results. Pt also mention that per last visit she wasn't about to pick up the last Rx for Pulmicort and Xopenex nebs that was sent in, she  mentioned that they are not cover by her insurance, and she can't afford them right now at this moment, due to financial hardship. Pt did state that her breathing is a little better today. Please advise.

## 2022-03-17 IMAGING — DX DG CHEST 1V PORT
1 series · 1 of 1 positions shown · non-contrast
Comparison: December 10, 2020

CLINICAL DATA: Chest pain.

EXAM:
PORTABLE CHEST 1 VIEW

[chest ap]
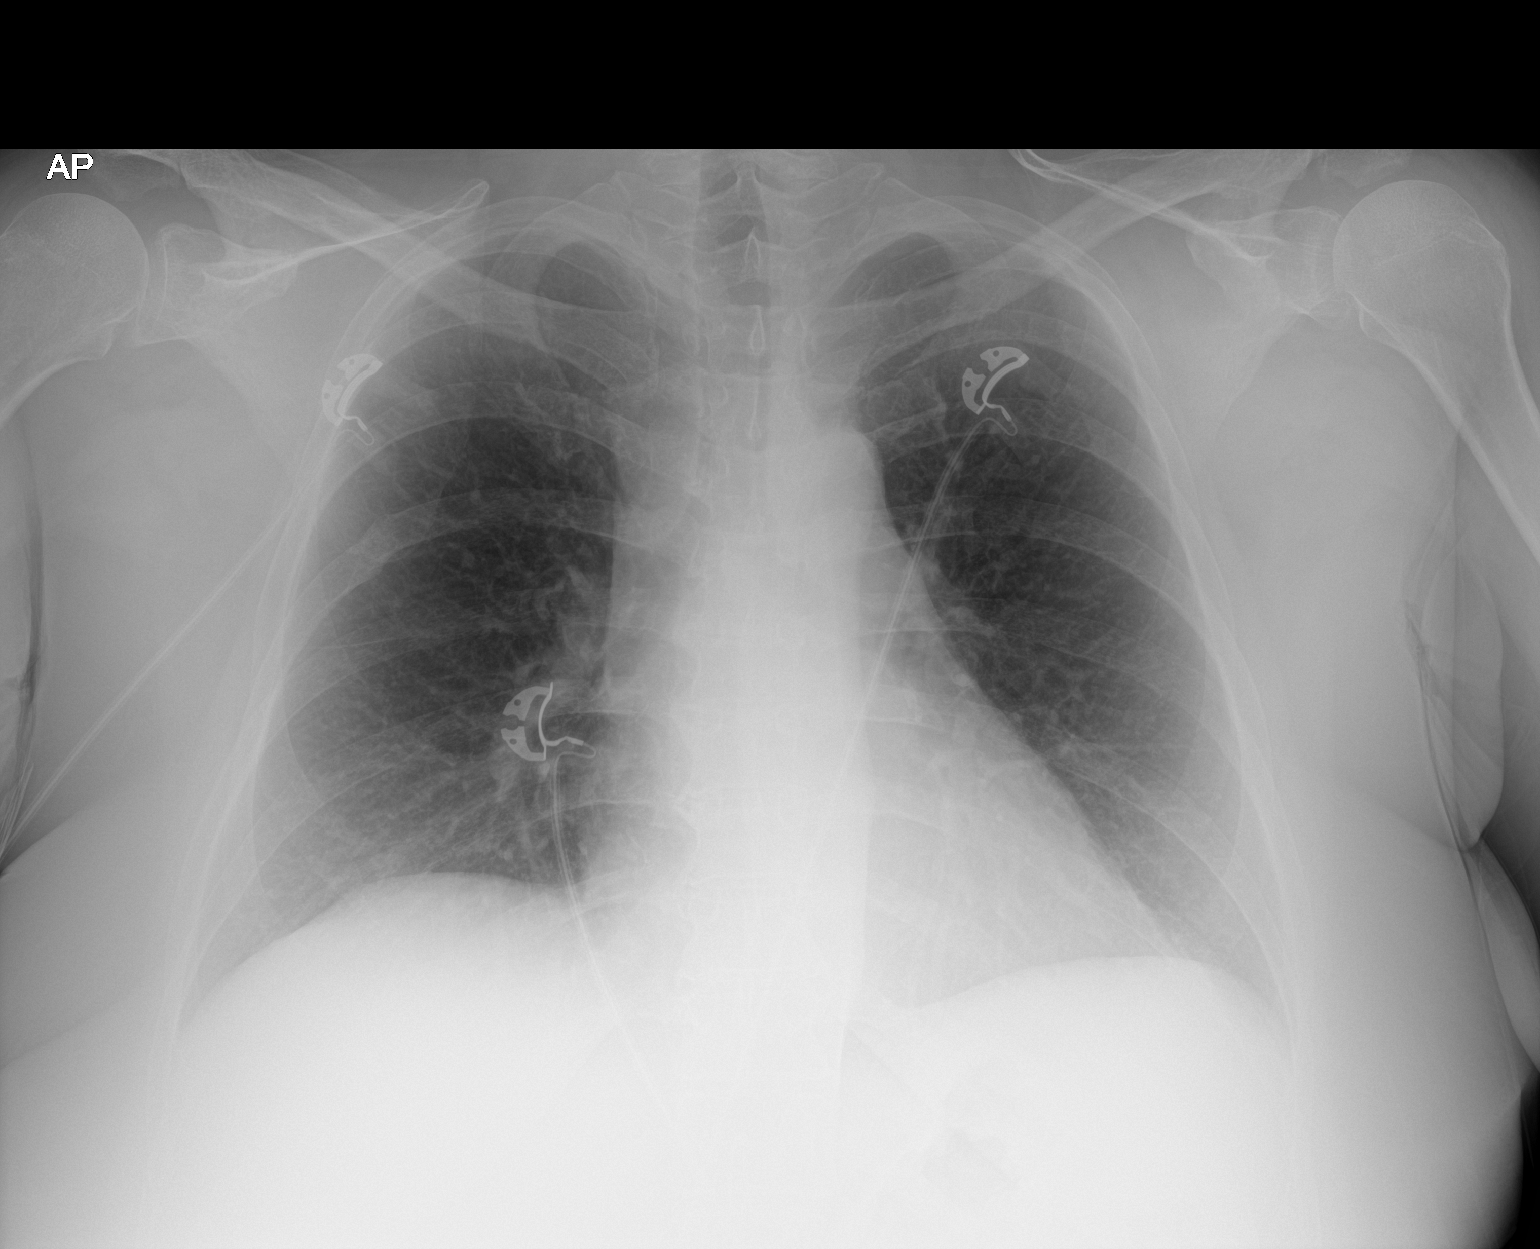

[1 of 1 positions shown; findings below may reference images not displayed]

FINDINGS: The heart size and mediastinal contours are within normal limits.
Both lungs are clear. The visualized skeletal structures are without
acute abnormalities.
IMPRESSION: No active disease.

## 2022-03-17 NOTE — Telephone Encounter (Signed)
Noted  

## 2022-03-17 NOTE — Telephone Encounter (Signed)
Calling the pharmacy arnuity and alvesco where both $10.25 and pulmicort was $19 and some change duoneb was only thing a tier 1 on formulary

## 2022-03-17 NOTE — Telephone Encounter (Signed)
Her chest x-ray from 03/16/22 shows:"No acute cardiopulmonary disease identified." There are really not any other options to use for her asthma from where she has had problems with the following inhalers: Alvesco, Arnuity, Qvar, Pulmicort, Atrovent, Advair, Symbicort,Flovent, Asmanex, and Spiriva.  She also reports that she is limited to what inhaler she can use due to her cardiac history. Pulmicort should be covered by her insurance. What is covered by her insurance?

## 2022-03-18 DIAGNOSIS — Z79899 Other long term (current) drug therapy: Secondary | ICD-10-CM | POA: Diagnosis not present

## 2022-03-18 DIAGNOSIS — R9431 Abnormal electrocardiogram [ECG] [EKG]: Secondary | ICD-10-CM | POA: Diagnosis not present

## 2022-03-18 DIAGNOSIS — R1013 Epigastric pain: Secondary | ICD-10-CM | POA: Diagnosis not present

## 2022-03-18 DIAGNOSIS — R110 Nausea: Secondary | ICD-10-CM | POA: Diagnosis not present

## 2022-03-18 DIAGNOSIS — Z888 Allergy status to other drugs, medicaments and biological substances status: Secondary | ICD-10-CM | POA: Diagnosis not present

## 2022-03-19 DIAGNOSIS — R9431 Abnormal electrocardiogram [ECG] [EKG]: Secondary | ICD-10-CM | POA: Diagnosis not present

## 2022-03-20 DIAGNOSIS — R000 Tachycardia, unspecified: Secondary | ICD-10-CM | POA: Diagnosis not present

## 2022-03-20 DIAGNOSIS — R109 Unspecified abdominal pain: Secondary | ICD-10-CM | POA: Diagnosis not present

## 2022-03-20 DIAGNOSIS — R1013 Epigastric pain: Secondary | ICD-10-CM | POA: Diagnosis not present

## 2022-03-20 DIAGNOSIS — Z87891 Personal history of nicotine dependence: Secondary | ICD-10-CM | POA: Diagnosis not present

## 2022-03-20 DIAGNOSIS — Z888 Allergy status to other drugs, medicaments and biological substances status: Secondary | ICD-10-CM | POA: Diagnosis not present

## 2022-03-20 DIAGNOSIS — Z882 Allergy status to sulfonamides status: Secondary | ICD-10-CM | POA: Diagnosis not present

## 2022-03-20 DIAGNOSIS — R079 Chest pain, unspecified: Secondary | ICD-10-CM | POA: Diagnosis not present

## 2022-03-20 DIAGNOSIS — Z880 Allergy status to penicillin: Secondary | ICD-10-CM | POA: Diagnosis not present

## 2022-03-20 DIAGNOSIS — K5790 Diverticulosis of intestine, part unspecified, without perforation or abscess without bleeding: Secondary | ICD-10-CM | POA: Diagnosis not present

## 2022-03-20 DIAGNOSIS — I2510 Atherosclerotic heart disease of native coronary artery without angina pectoris: Secondary | ICD-10-CM | POA: Diagnosis not present

## 2022-03-20 DIAGNOSIS — I700 Atherosclerosis of aorta: Secondary | ICD-10-CM | POA: Diagnosis not present

## 2022-03-20 DIAGNOSIS — Z886 Allergy status to analgesic agent status: Secondary | ICD-10-CM | POA: Diagnosis not present

## 2022-03-20 DIAGNOSIS — Z881 Allergy status to other antibiotic agents status: Secondary | ICD-10-CM | POA: Diagnosis not present

## 2022-03-20 DIAGNOSIS — Z885 Allergy status to narcotic agent status: Secondary | ICD-10-CM | POA: Diagnosis not present

## 2022-03-21 DIAGNOSIS — R000 Tachycardia, unspecified: Secondary | ICD-10-CM | POA: Diagnosis not present

## 2022-03-21 DIAGNOSIS — R0789 Other chest pain: Secondary | ICD-10-CM | POA: Diagnosis not present

## 2022-03-22 DIAGNOSIS — R000 Tachycardia, unspecified: Secondary | ICD-10-CM | POA: Diagnosis not present

## 2022-03-24 ENCOUNTER — Ambulatory Visit (INDEPENDENT_AMBULATORY_CARE_PROVIDER_SITE_OTHER): Payer: Medicare Other | Admitting: Family Medicine

## 2022-03-24 ENCOUNTER — Encounter: Payer: Self-pay | Admitting: Family Medicine

## 2022-03-24 VITALS — BP 156/78 | HR 88 | Ht 62.0 in | Wt 237.1 lb

## 2022-03-24 DIAGNOSIS — R7301 Impaired fasting glucose: Secondary | ICD-10-CM

## 2022-03-24 DIAGNOSIS — H938X2 Other specified disorders of left ear: Secondary | ICD-10-CM

## 2022-03-24 DIAGNOSIS — I10 Essential (primary) hypertension: Secondary | ICD-10-CM

## 2022-03-24 DIAGNOSIS — E876 Hypokalemia: Secondary | ICD-10-CM | POA: Diagnosis not present

## 2022-03-24 DIAGNOSIS — H5711 Ocular pain, right eye: Secondary | ICD-10-CM | POA: Diagnosis not present

## 2022-03-24 LAB — POCT UA - MICROALBUMIN
Albumin/Creatinine Ratio, Urine, POC: <30
Creatinine, POC: 300 mg/dL
Microalbumin Ur, POC: 80 mg/L

## 2022-03-24 NOTE — Progress Notes (Unsigned)
   Established Patient Office Visit  Subjective   Patient ID: Jennifer [name], female    DOB: 1965-10-02  Age: 57 y.o. MRN: [id number]  Chief Complaint  Patient presents with   Nevus    HPI  She has been having more upper epigastric and left-sided chest pain sometimes radiating up to her jaw and teeth.  She is scheduled on Tuesday for an upper and lower GI.  Did restart her famotidine twice a day about a week ago.  Cardiology has cleared to to start light exercise. She is thinking about going to gym.    She also wanted to make sure that we schedule time for some skin tag removals and shave biopsy for lesion on her back.  We will do that at the next office visit.  Would like a new endocrinology referral she had seen Dr. Guillermina [place] previously in the past and thinks that she is in [place] she used to be in [facility] and would like to get back in with her.  She is also been having some fullness and feeling like her left ear is not right.  It does not pop like the right 1.  She says her husband feels like she has been turning up the TV to hear it.  Also wanted to inquire about any updates on her sleep study referral that was placed to [facility].  He did see her dentist recently and unfortunately has a cavity that is pretty deep into the tooth and so they are talking about having to do some extensive dental work.   {History (Optional):23778}  ROS    Objective:     BP (!) 156/78 (BP Location: Left Arm, Patient Position: Sitting, Cuff Size: Large)   Pulse 88   Ht '5\' 2"'$  (1.575 m)   Wt 237 lb 1.6 oz (107.5 kg)   LMP 06/25/2013   SpO2 97%   BMI 43.37 kg/m  {Vitals History (Optional):23777}  Physical Exam   Results for orders placed or performed in visit on 03/24/22  POCT UA - Microalbumin  Result Value Ref Range   Microalbumin Ur, POC 80 mg/L   Creatinine, POC 300 mg/dL   Albumin/Creatinine Ratio, Urine, POC <30     {Labs (Optional):23779}  The ASCVD Risk score  (Arnett DK, et al., 2019) failed to calculate for the following reasons:   The patient has a prior MI or stroke diagnosis    Assessment & Plan:   Problem List Items Addressed This Visit       Endocrine   IFG (impaired fasting glucose) - Primary   Relevant Orders   POCT UA - Microalbumin (Completed)    No follow-ups on file.    Beatrice Lecher, MD

## 2022-03-26 IMAGING — DX DG ABDOMEN 1V
2 series · 2 of 2 positions shown · non-contrast
Comparison: December 10, 2020

CLINICAL DATA: Constipation.

EXAM:
ABDOMEN - 1 VIEW

[abdomen kub (1 of 2)]
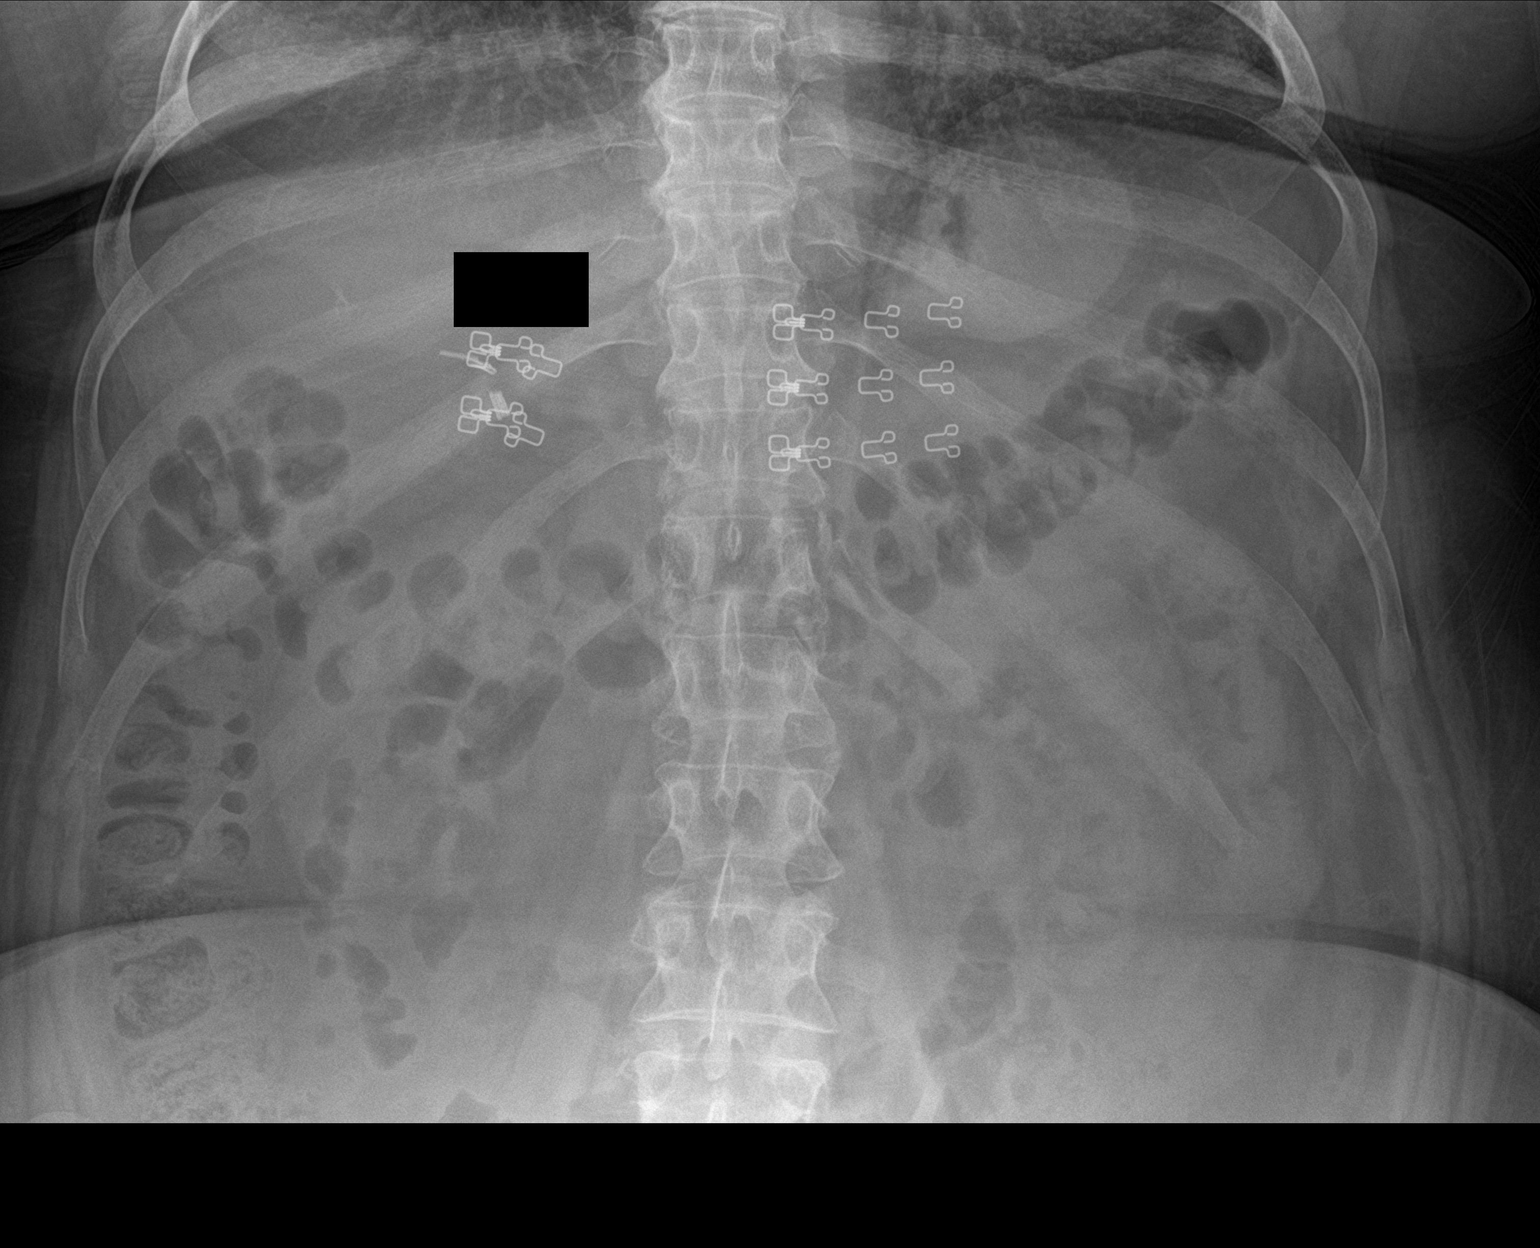

[abdomen kub (2 of 2)]
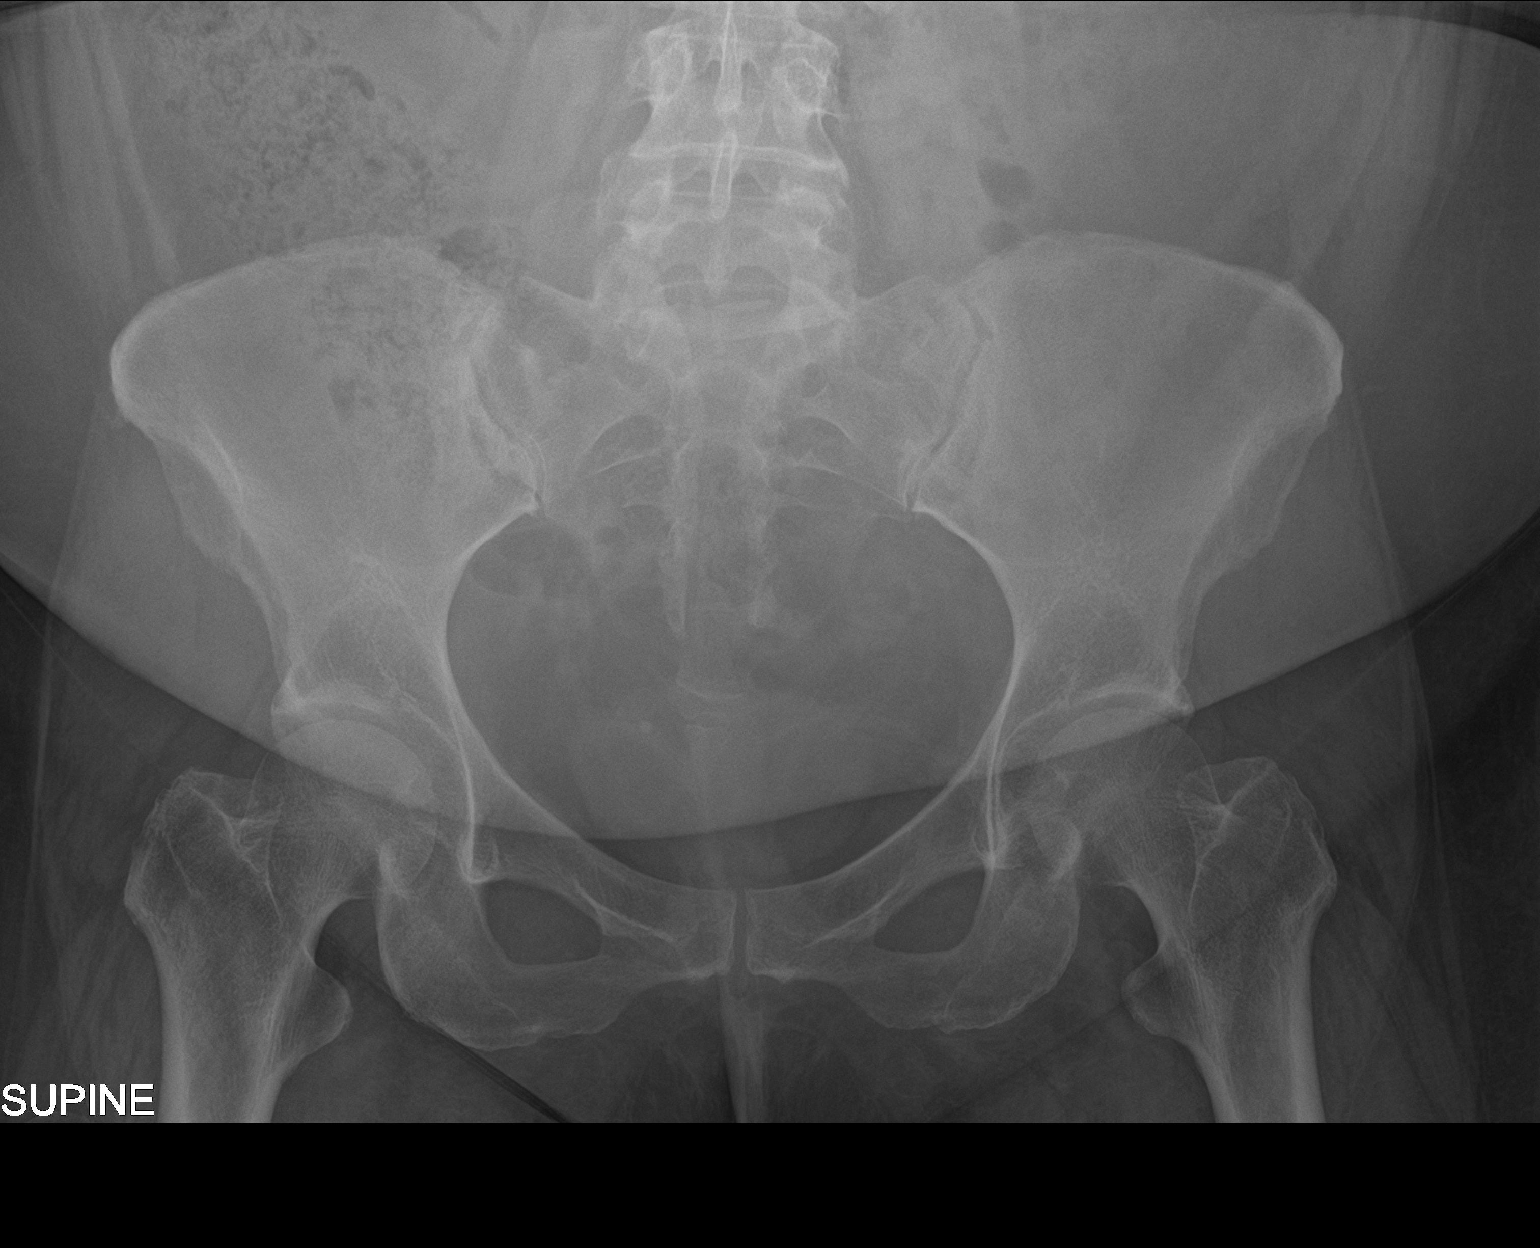

[2 of 2 positions shown; findings below may reference images not displayed]

FINDINGS: The bowel gas pattern is normal. A large amount of stool is seen
within the ascending colon. Radiopaque surgical clips are seen
within the right upper quadrant. No radio-opaque calculi or other
significant radiographic abnormality are seen.
IMPRESSION: Large stool burden within the ascending colon without evidence of
bowel obstruction.

## 2022-03-27 DIAGNOSIS — K5730 Diverticulosis of large intestine without perforation or abscess without bleeding: Secondary | ICD-10-CM | POA: Diagnosis not present

## 2022-03-27 DIAGNOSIS — K429 Umbilical hernia without obstruction or gangrene: Secondary | ICD-10-CM | POA: Diagnosis not present

## 2022-03-27 DIAGNOSIS — R000 Tachycardia, unspecified: Secondary | ICD-10-CM | POA: Diagnosis not present

## 2022-03-27 DIAGNOSIS — K439 Ventral hernia without obstruction or gangrene: Secondary | ICD-10-CM | POA: Diagnosis not present

## 2022-03-27 DIAGNOSIS — I700 Atherosclerosis of aorta: Secondary | ICD-10-CM | POA: Diagnosis not present

## 2022-03-27 DIAGNOSIS — N281 Cyst of kidney, acquired: Secondary | ICD-10-CM | POA: Diagnosis not present

## 2022-03-27 DIAGNOSIS — K760 Fatty (change of) liver, not elsewhere classified: Secondary | ICD-10-CM | POA: Diagnosis not present

## 2022-03-27 DIAGNOSIS — I2510 Atherosclerotic heart disease of native coronary artery without angina pectoris: Secondary | ICD-10-CM | POA: Diagnosis not present

## 2022-03-27 DIAGNOSIS — R1013 Epigastric pain: Secondary | ICD-10-CM | POA: Diagnosis not present

## 2022-03-27 DIAGNOSIS — R112 Nausea with vomiting, unspecified: Secondary | ICD-10-CM | POA: Diagnosis not present

## 2022-03-28 DIAGNOSIS — R1013 Epigastric pain: Secondary | ICD-10-CM | POA: Diagnosis not present

## 2022-03-28 DIAGNOSIS — R9431 Abnormal electrocardiogram [ECG] [EKG]: Secondary | ICD-10-CM | POA: Diagnosis not present

## 2022-03-28 DIAGNOSIS — K589 Irritable bowel syndrome without diarrhea: Secondary | ICD-10-CM | POA: Diagnosis not present

## 2022-03-28 DIAGNOSIS — K2289 Other specified disease of esophagus: Secondary | ICD-10-CM | POA: Diagnosis not present

## 2022-03-28 DIAGNOSIS — K317 Polyp of stomach and duodenum: Secondary | ICD-10-CM | POA: Diagnosis not present

## 2022-03-28 DIAGNOSIS — K2950 Unspecified chronic gastritis without bleeding: Secondary | ICD-10-CM | POA: Diagnosis not present

## 2022-03-28 DIAGNOSIS — R1310 Dysphagia, unspecified: Secondary | ICD-10-CM | POA: Diagnosis not present

## 2022-03-28 DIAGNOSIS — R1084 Generalized abdominal pain: Secondary | ICD-10-CM | POA: Diagnosis not present

## 2022-03-28 DIAGNOSIS — I10 Essential (primary) hypertension: Secondary | ICD-10-CM | POA: Diagnosis not present

## 2022-03-29 ENCOUNTER — Telehealth: Payer: Self-pay | Admitting: Family Medicine

## 2022-03-29 IMAGING — DX DG CHEST 1V PORT
1 series · 1 of 1 positions shown · non-contrast
Comparison: Chest radiograph dated 12/22/2020.

CLINICAL DATA: 54-year-old female with chest pain.

EXAM:
PORTABLE CHEST 1 VIEW

[chest ap]
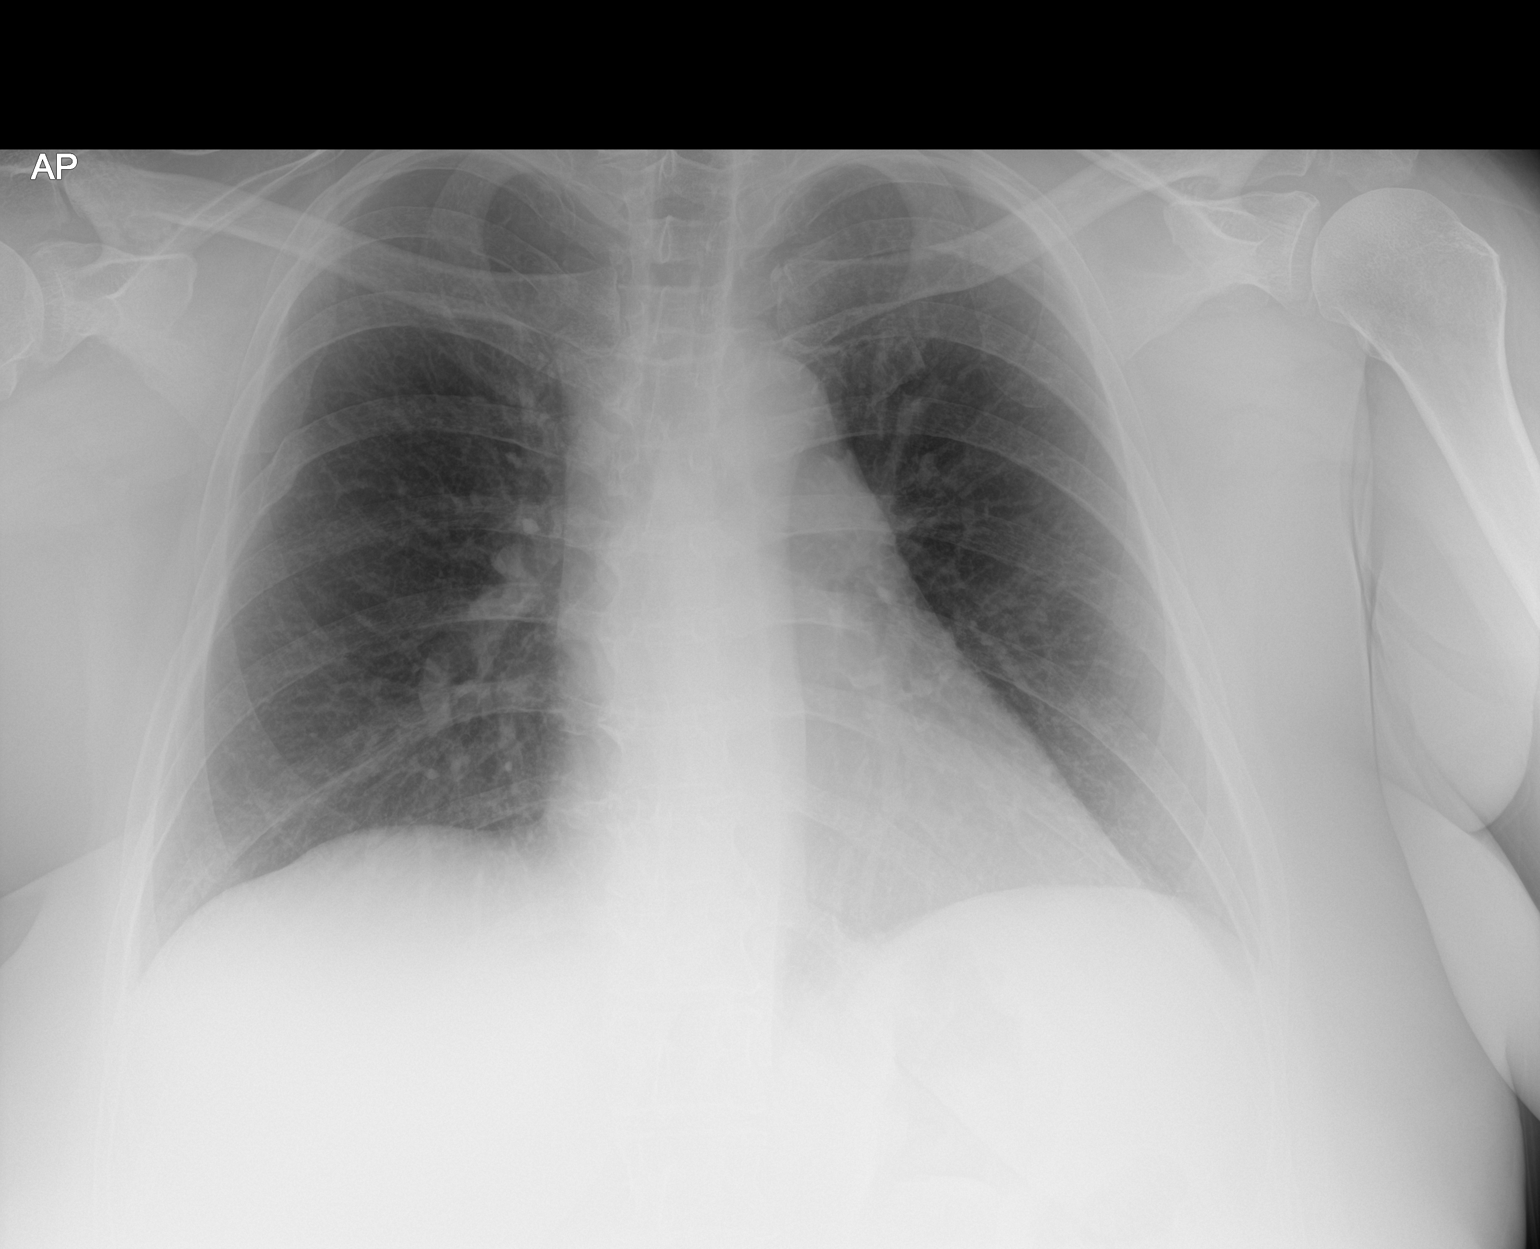

[1 of 1 positions shown; findings below may reference images not displayed]

FINDINGS: The lungs are clear. There is no pleural effusion pneumothorax. The
cardiac silhouette is within limits. No acute osseous pathology. Old
healed right posterior rib fracture.
IMPRESSION: No active disease.

## 2022-03-29 NOTE — Assessment & Plan Note (Signed)
  Lab Results  Component Value Date   HGBA1C 6.1 (A) 12/09/2021   Plan to recheck A1c in about 2 months.  We will continue to monitor carefully continue to work on [employer]

## 2022-03-29 NOTE — Telephone Encounter (Signed)
Pt called and upset requesting an appt with her PCP, and I informed her that her pcp does not have any openings. She is stating she is in really bad pain with her stomach that also causes Jaw pain. She has been seen by GI and the hospital and was told to F/u with her pcp and they found nothing. She States that a few medical people asked her if she has ever had a abdominal aneurysm and she really wants to rule this out with vascular studies and is in a lot of pain. Patient requested I send this information to her pcp. Please advise patient

## 2022-03-30 DIAGNOSIS — E1165 Type 2 diabetes mellitus with hyperglycemia: Secondary | ICD-10-CM | POA: Diagnosis not present

## 2022-03-30 DIAGNOSIS — R000 Tachycardia, unspecified: Secondary | ICD-10-CM | POA: Diagnosis not present

## 2022-03-30 DIAGNOSIS — N289 Disorder of kidney and ureter, unspecified: Secondary | ICD-10-CM | POA: Diagnosis not present

## 2022-03-30 DIAGNOSIS — K5730 Diverticulosis of large intestine without perforation or abscess without bleeding: Secondary | ICD-10-CM | POA: Diagnosis not present

## 2022-03-30 DIAGNOSIS — Z79899 Other long term (current) drug therapy: Secondary | ICD-10-CM | POA: Diagnosis not present

## 2022-03-30 DIAGNOSIS — R030 Elevated blood-pressure reading, without diagnosis of hypertension: Secondary | ICD-10-CM | POA: Diagnosis not present

## 2022-03-30 DIAGNOSIS — R109 Unspecified abdominal pain: Secondary | ICD-10-CM | POA: Diagnosis not present

## 2022-03-30 DIAGNOSIS — R110 Nausea: Secondary | ICD-10-CM | POA: Diagnosis not present

## 2022-03-30 DIAGNOSIS — R1013 Epigastric pain: Secondary | ICD-10-CM | POA: Diagnosis not present

## 2022-03-30 DIAGNOSIS — K760 Fatty (change of) liver, not elsewhere classified: Secondary | ICD-10-CM | POA: Diagnosis not present

## 2022-03-30 DIAGNOSIS — Z9889 Other specified postprocedural states: Secondary | ICD-10-CM | POA: Diagnosis not present

## 2022-03-30 DIAGNOSIS — G8929 Other chronic pain: Secondary | ICD-10-CM | POA: Diagnosis not present

## 2022-03-30 DIAGNOSIS — R079 Chest pain, unspecified: Secondary | ICD-10-CM | POA: Diagnosis not present

## 2022-03-30 NOTE — Telephone Encounter (Signed)
There is no concern for aneurysm.  She has had abdominal CTs.  If she had an aneurysm large enough to cause discomfort or pain it would have been seen.  Vascular study not needed.  Sorry she is having pain but I would recommend again that she follow-up with GI.

## 2022-03-31 ENCOUNTER — Telehealth: Payer: Self-pay | Admitting: General Practice

## 2022-03-31 NOTE — Telephone Encounter (Signed)
Transition Care Management Follow-up Telephone Call Date of discharge and from where: 03/30/22 from [facility] How have you been since you were released from the hospital? Patient stated "does it matter?" When I asked again, she stated "she rather not talk about it." Patient advised to give [place] a call if she needed anything.  Any questions or concerns? No   Follow up appointments reviewed:  PCP Hospital f/u appt confirmed? Yes  Scheduled to see Dr. Madilyn Fireman on 04/07/22. [facility] f/u appt confirmed? No   Are transportation arrangements needed? No  If their condition worsens, is the pt aware to call PCP or go to the Emergency Dept.? Yes Was the patient provided with contact information for the PCP's office or ED? Yes Was to pt encouraged to call back with questions or concerns? Yes

## 2022-04-05 ENCOUNTER — Encounter: Payer: Self-pay | Admitting: General Practice

## 2022-04-05 DIAGNOSIS — R0989 Other specified symptoms and signs involving the circulatory and respiratory systems: Secondary | ICD-10-CM | POA: Diagnosis not present

## 2022-04-05 DIAGNOSIS — R0602 Shortness of breath: Secondary | ICD-10-CM | POA: Diagnosis not present

## 2022-04-05 DIAGNOSIS — R000 Tachycardia, unspecified: Secondary | ICD-10-CM | POA: Diagnosis not present

## 2022-04-06 DIAGNOSIS — I10 Essential (primary) hypertension: Secondary | ICD-10-CM | POA: Diagnosis not present

## 2022-04-07 ENCOUNTER — Ambulatory Visit (INDEPENDENT_AMBULATORY_CARE_PROVIDER_SITE_OTHER): Payer: Medicare Other | Admitting: Family Medicine

## 2022-04-07 ENCOUNTER — Ambulatory Visit (INDEPENDENT_AMBULATORY_CARE_PROVIDER_SITE_OTHER): Payer: Medicare Other | Admitting: Podiatry

## 2022-04-07 ENCOUNTER — Encounter: Payer: Self-pay | Admitting: Podiatry

## 2022-04-07 ENCOUNTER — Encounter: Payer: Self-pay | Admitting: Family Medicine

## 2022-04-07 ENCOUNTER — Telehealth: Payer: Self-pay | Admitting: General Practice

## 2022-04-07 VITALS — BP 155/77 | HR 93 | Ht 62.0 in | Wt 239.1 lb

## 2022-04-07 DIAGNOSIS — J029 Acute pharyngitis, unspecified: Secondary | ICD-10-CM | POA: Diagnosis not present

## 2022-04-07 DIAGNOSIS — M545 Low back pain, unspecified: Secondary | ICD-10-CM | POA: Diagnosis not present

## 2022-04-07 DIAGNOSIS — J019 Acute sinusitis, unspecified: Secondary | ICD-10-CM

## 2022-04-07 DIAGNOSIS — M5489 Other dorsalgia: Secondary | ICD-10-CM | POA: Diagnosis not present

## 2022-04-07 DIAGNOSIS — S2231XA Fracture of one rib, right side, initial encounter for closed fracture: Secondary | ICD-10-CM | POA: Diagnosis not present

## 2022-04-07 DIAGNOSIS — M25552 Pain in left hip: Secondary | ICD-10-CM

## 2022-04-07 DIAGNOSIS — R000 Tachycardia, unspecified: Secondary | ICD-10-CM | POA: Diagnosis not present

## 2022-04-07 DIAGNOSIS — R1312 Dysphagia, oropharyngeal phase: Secondary | ICD-10-CM

## 2022-04-07 DIAGNOSIS — M25562 Pain in left knee: Secondary | ICD-10-CM

## 2022-04-07 DIAGNOSIS — L853 Xerosis cutis: Secondary | ICD-10-CM

## 2022-04-07 DIAGNOSIS — Z20822 Contact with and (suspected) exposure to covid-19: Secondary | ICD-10-CM | POA: Diagnosis not present

## 2022-04-07 DIAGNOSIS — R079 Chest pain, unspecified: Secondary | ICD-10-CM | POA: Diagnosis not present

## 2022-04-07 DIAGNOSIS — R0989 Other specified symptoms and signs involving the circulatory and respiratory systems: Secondary | ICD-10-CM

## 2022-04-07 LAB — POCT URINALYSIS DIP (CLINITEK)
Blood, UA: NEGATIVE
Glucose, UA: NEGATIVE mg/dL
Leukocytes, UA: NEGATIVE
Nitrite, UA: NEGATIVE
POC PROTEIN,UA: 30 — AB
Spec Grav, UA: >=1.03 — AB (ref 1.010–1.025)
Urobilinogen, UA: 0.2 E.U./dL
pH, UA: 5.5 (ref 5.0–8.0)

## 2022-04-07 IMAGING — CR DG LUMBAR SPINE COMPLETE 4+V
5 series · 5 of 5 positions shown · non-contrast
Comparison: None.

CLINICAL DATA: Fall 2 days ago, now with low back pain.

EXAM:
LUMBAR SPINE - COMPLETE 4+ VIEW

[t l-spine a.p. *]
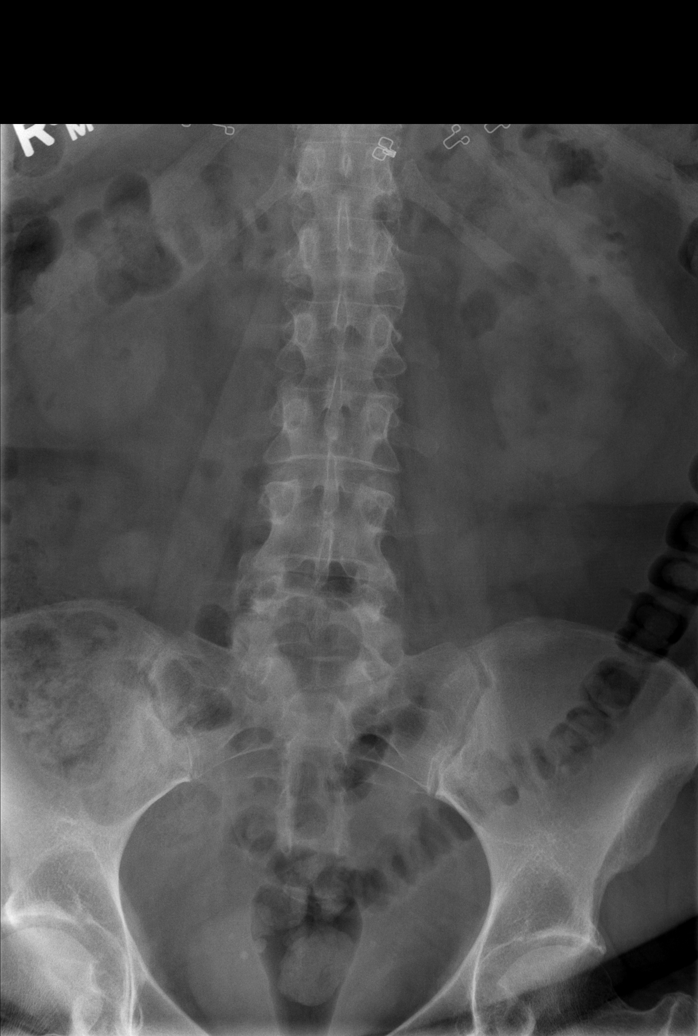

[t l-spine oblique exposure *]
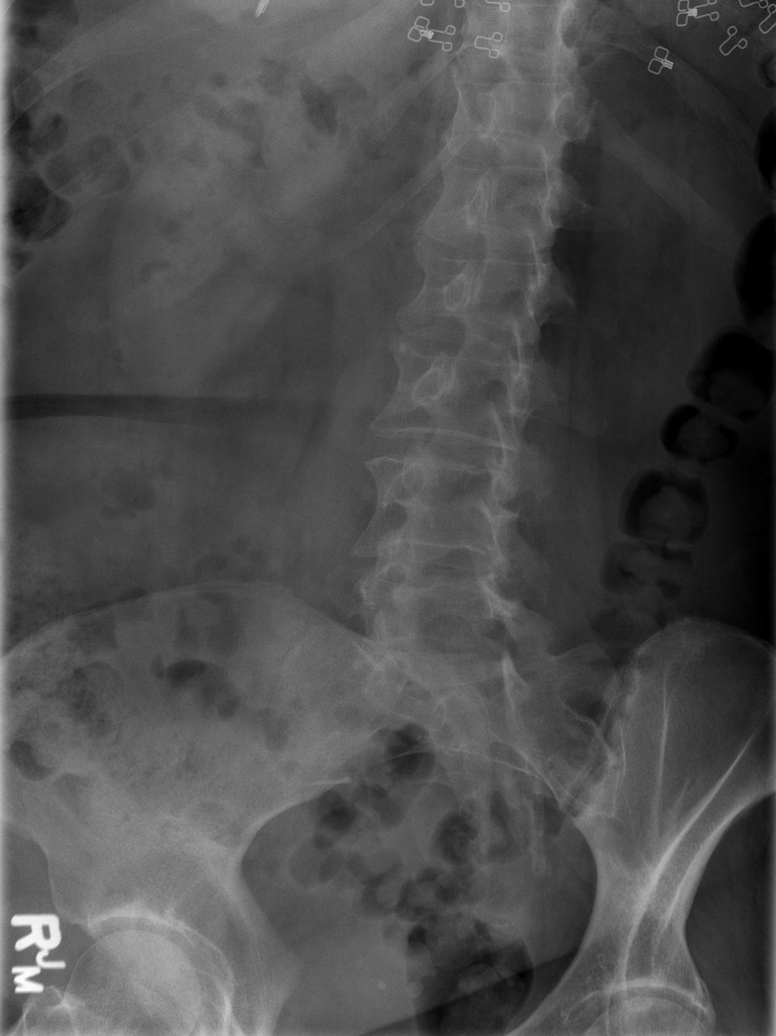

[t l-spine oblique exposure]
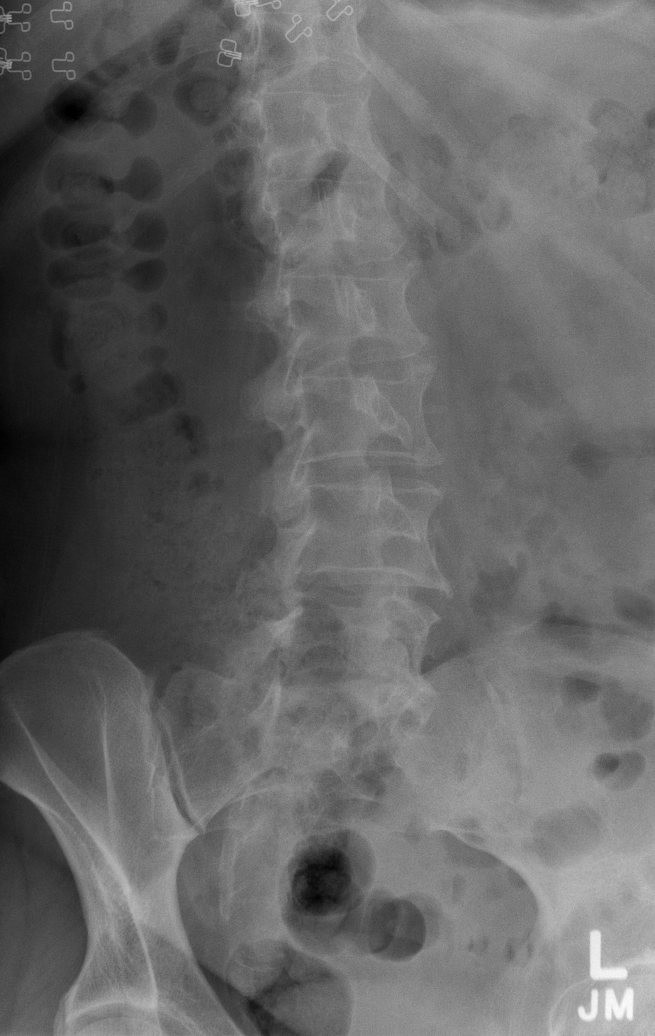

[t l-spine lat]
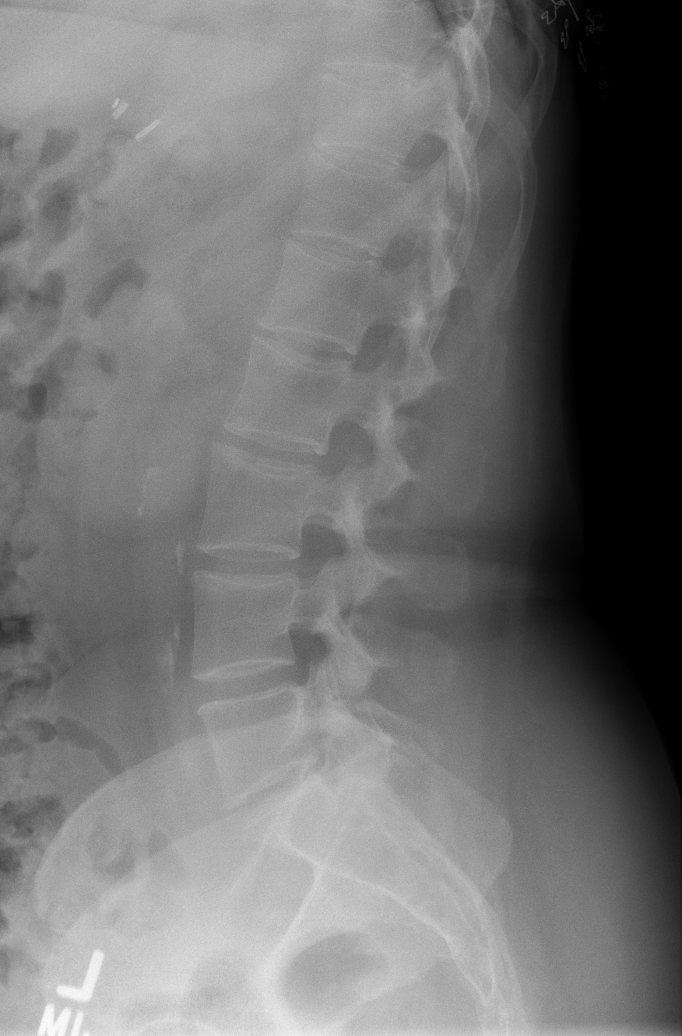

[t l-spine l5-s1 spot]
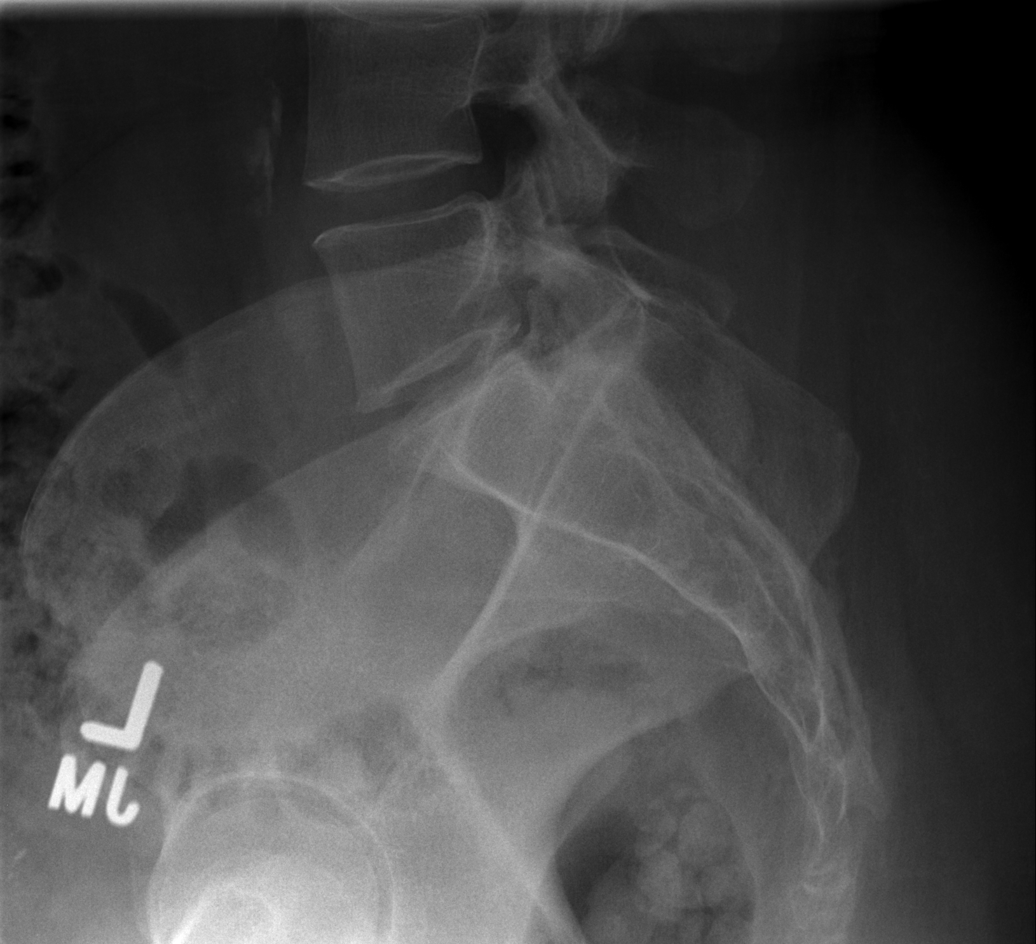

[5 of 5 positions shown; findings below may reference images not displayed]

FINDINGS: There are 5 non rib-bearing lumbar type vertebral bodies.

Normal alignment of the lumbar spine. No anterolisthesis or
retrolisthesis. No definite pars defects.

Lumbar vertebral body heights are preserved.

Mild multilevel lumbar spine DDD, worse at L5-S1 with disc space
height loss, endplate irregularity and sclerosis.

Limited visualization of the bilateral SI joints is normal.

Punctate phleboliths overlie the lower pelvis bilaterally.
Atherosclerotic plaque within the abdominal aorta. Nonobstructive
bowel gas pattern.
IMPRESSION: 1. No acute findings.
2. Mild multilevel lumbar spine DDD, worse at L5-S1.
3.  Aortic Atherosclerosis ([id number]).

## 2022-04-07 IMAGING — DX DG CHEST 2V
2 series · 2 of 2 positions shown · non-contrast
Comparison: 01/03/2021.

CLINICAL DATA: Fall.  Shortness of breath.  Back pain

EXAM:
CHEST - 2 VIEW

[chest pa]
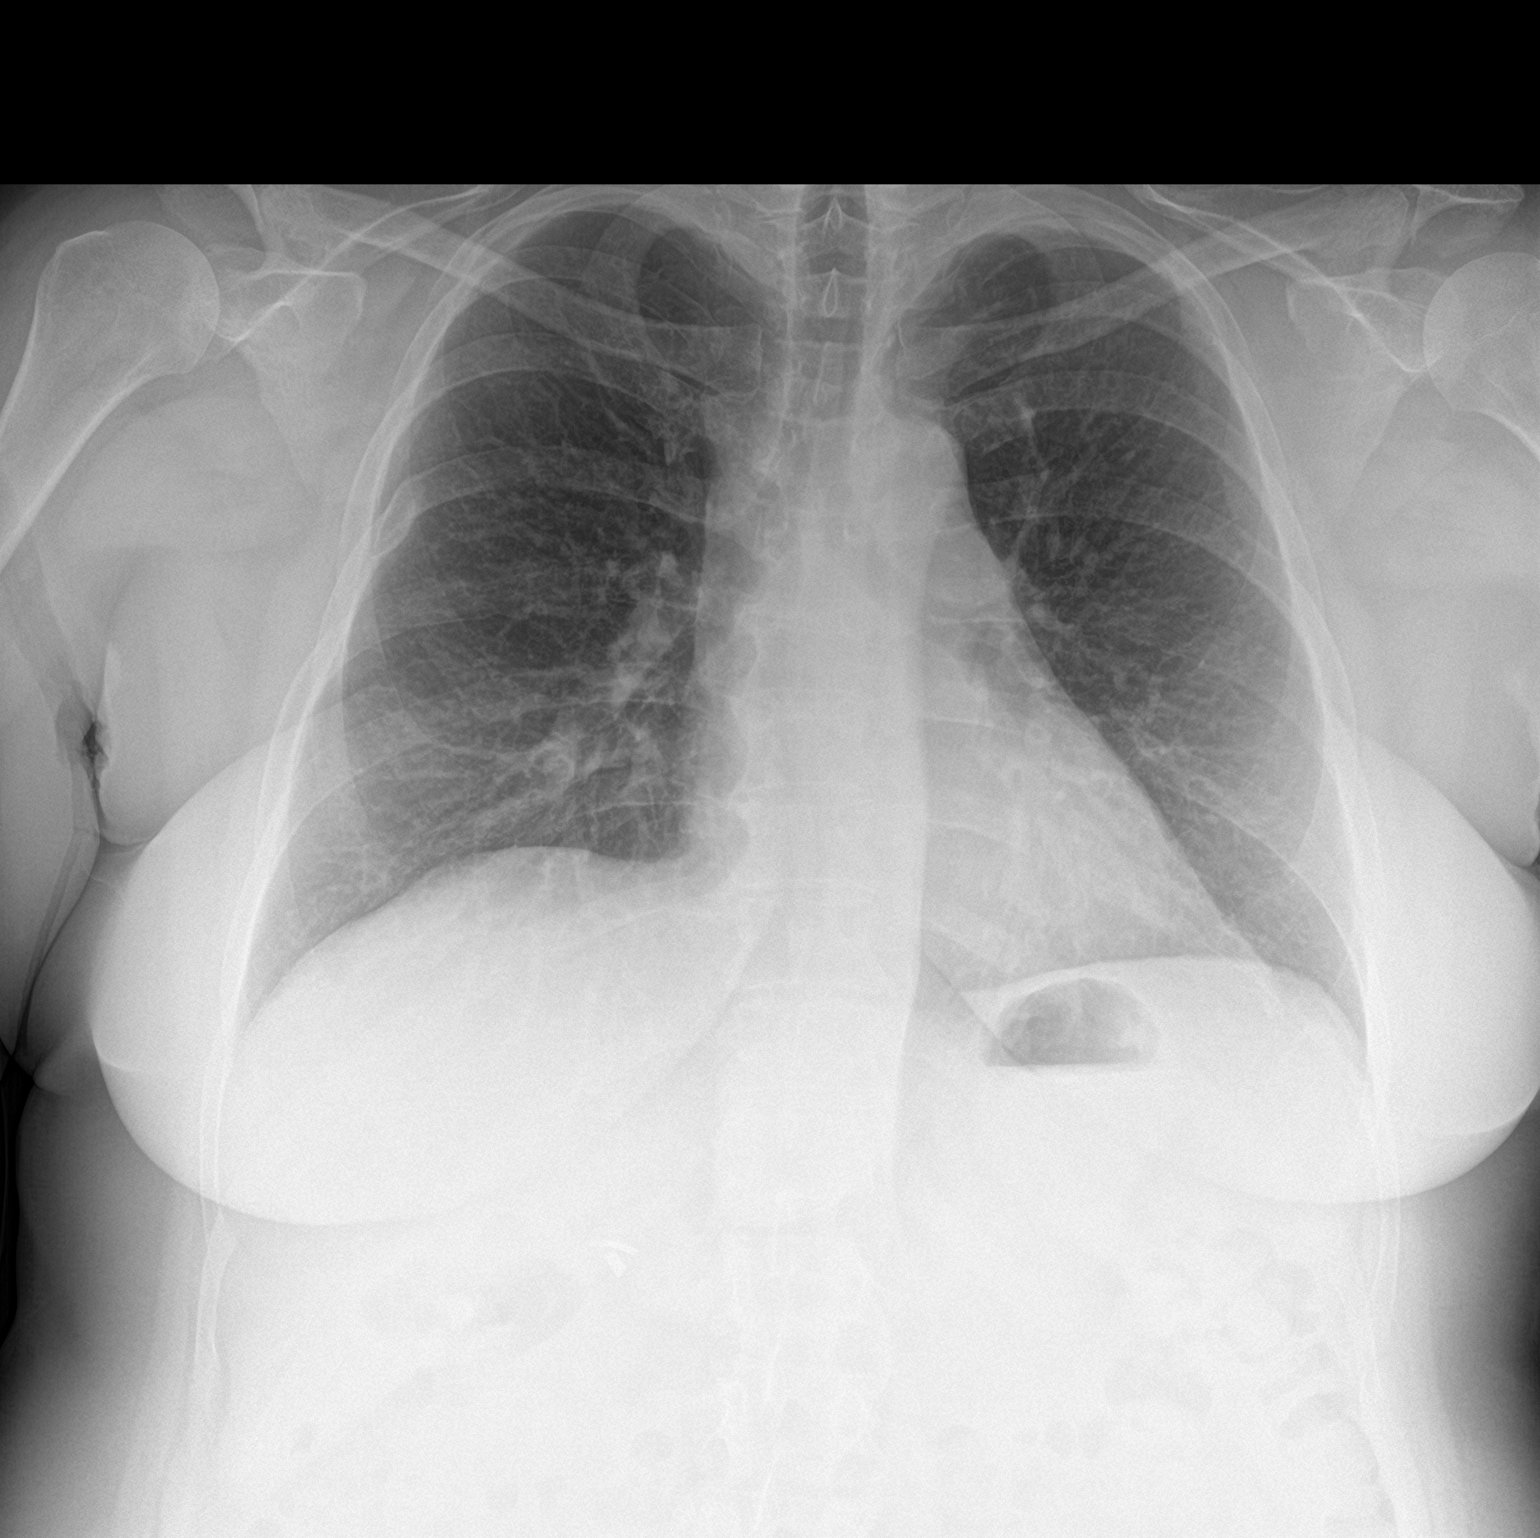

[chest lat]
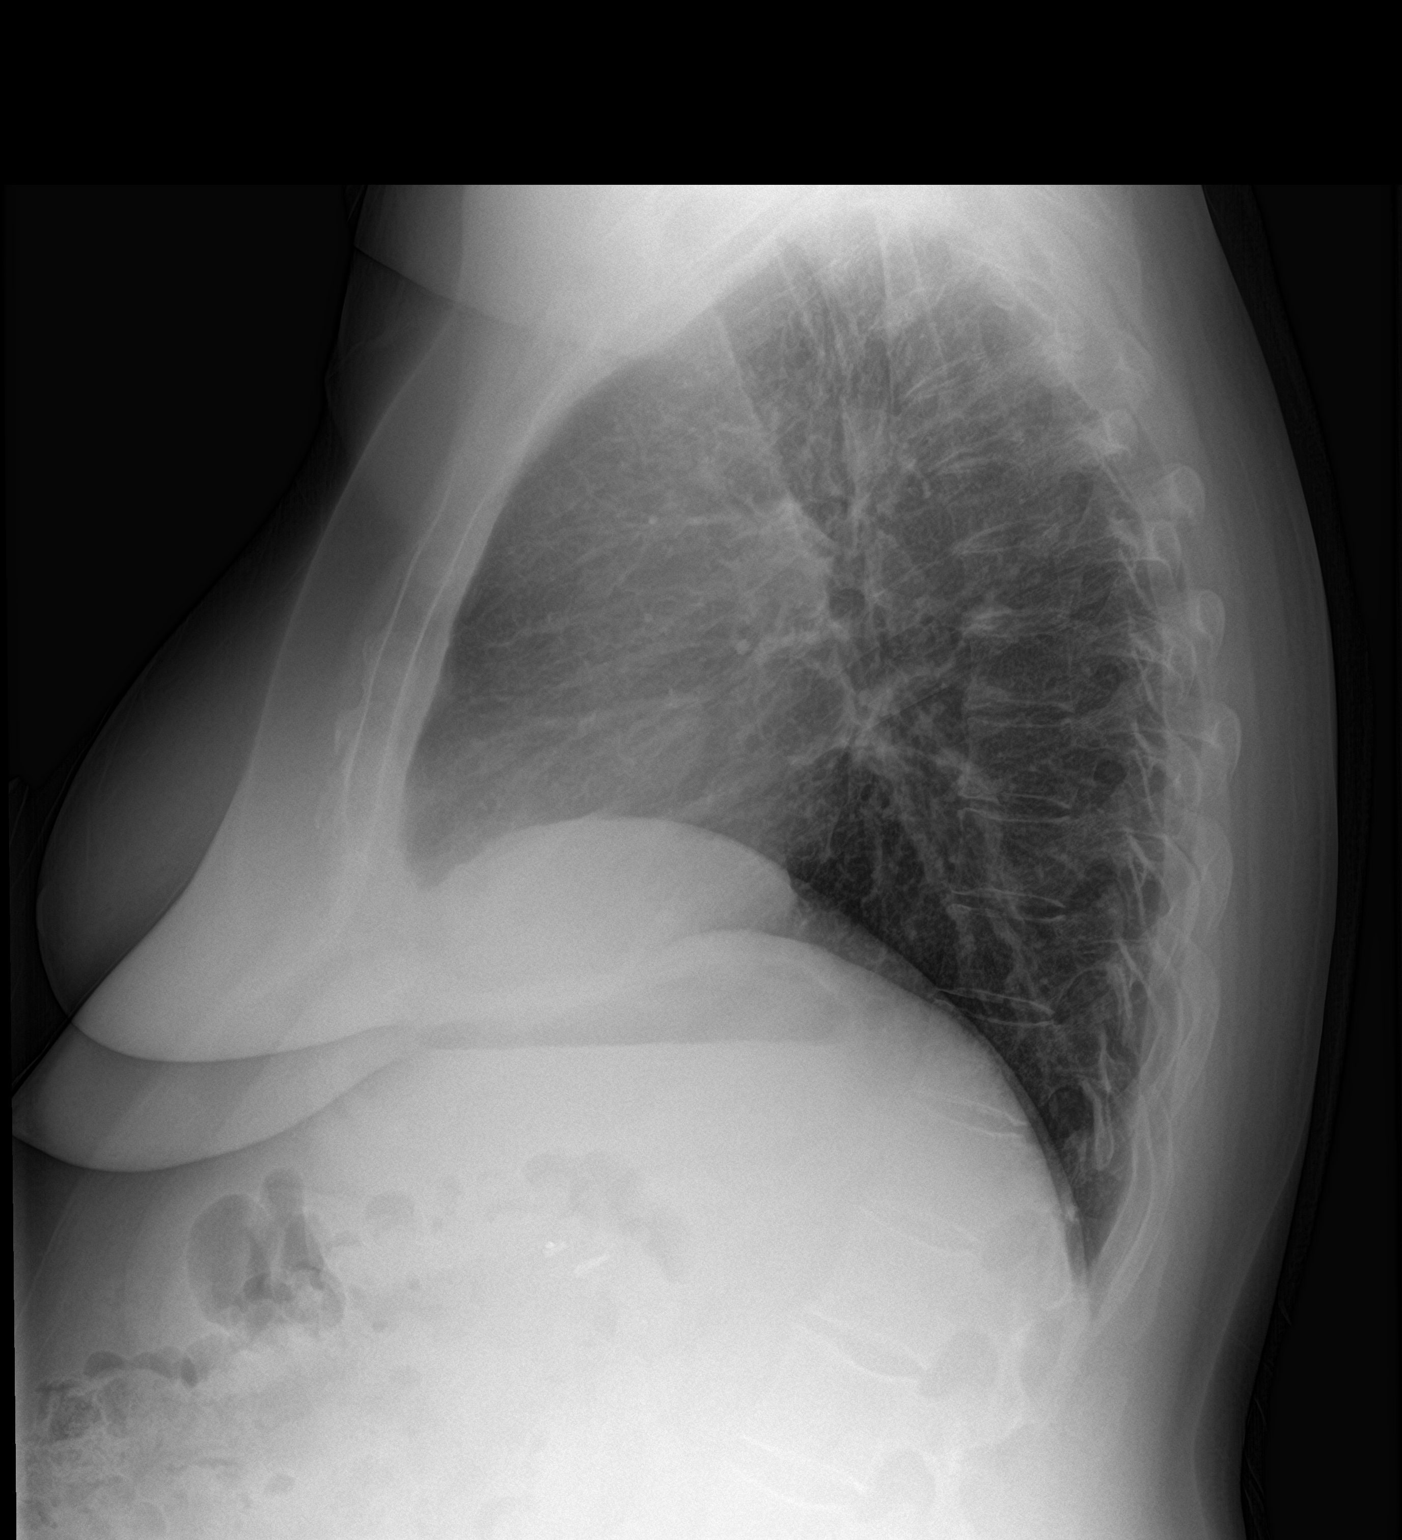

[2 of 2 positions shown; findings below may reference images not displayed]

FINDINGS: Mediastinum hilar structures normal. Heart size normal. Low lung
volumes. No focal infiltrate. No pleural effusion or pneumothorax.
Old right posterior 6 rib fracture again noted. Degenerative change
thoracic spine. No acute bony abnormality. Surgical clips right
upper quadrant.
IMPRESSION: Low lung volumes. No acute cardiopulmonary disease. No acute bony
abnormality. No evidence of pneumothorax.

## 2022-04-07 MED ORDER — CLINDAMYCIN HCL 75 MG PO CAPS: 75.0000 mg | ORAL_CAPSULE | Freq: Three times a day (TID) | ORAL | 0 refills | Status: DC

## 2022-04-07 NOTE — Progress Notes (Signed)
Established Patient Office Visit  Subjective   Patient ID: Jennifer [name], female    DOB: June 02, 1966  Age: 56 y.o. MRN: [id number]  Chief Complaint  Patient presents with   Chest Pain   Back Pain    HPI  She is having some right-sided chest pain in the front and the back.  She is not sure if it is coming from her lung or something else.  Having some mid low back pain it feels like her spine is compressed.  Its painful when she is walking and especially with bending forward.  No recent injury.  She feels like sometimes when she tries to sit up she will feel it almost like her spine is shifting.  She was referred recently for physical therapy for her hip and knee but would like to have water therapy added to the regimen.  She also complains that she has had some persistent nasal drainage , sinus pain, but it seemed to get worse this morning with increased cough and thick frothy sputum.  She feels like her sinuses are little bit worse.  She still doing her saline rinses.  He also has a skin lesion on her back that her husband had noted but she is not sure which one it was so she is can have him circle it when she comes back in a couple of weeks.  Also Dealing with a globus sensation.  Asked went to the ED for the sensation she feels like it is just like a second discoordination of swallowing.  She has had issues with that previously.  In fact years ago she did some treatments with speech therapy and also ended up having some Botox injections done in her throat.  Feels very bloated and distended.  Feels bowels are moving okay right now.    ROS    Objective:     BP (!) 155/77 (BP Location: Left Arm, Patient Position: Sitting, Cuff Size: Large)   Pulse 93   Ht '5\' 2"'$  (1.575 m)   Wt 239 lb 1.6 oz (108.5 kg)   LMP 06/25/2013   SpO2 93%   BMI 43.73 kg/m    Physical Exam Vitals reviewed.  Constitutional:      Appearance: She is well-developed.  HENT:     Head: Normocephalic  and atraumatic.     Right Ear: Tympanic membrane, ear canal and external ear normal.     Left Ear: Tympanic membrane, ear canal and external ear normal.     Nose: Nose normal.  Eyes:     Conjunctiva/sclera: Conjunctivae normal.     Pupils: Pupils are equal, round, and reactive to light.  Neck:     Thyroid: No thyromegaly.  Cardiovascular:     Rate and Rhythm: Normal rate and regular rhythm.     Heart sounds: Normal heart sounds.  Pulmonary:     Effort: Pulmonary effort is normal.     Breath sounds: Normal breath sounds. No wheezing.  Abdominal:     General: There is distension.     Palpations: Abdomen is soft.     Tenderness: There is no abdominal tenderness. There is no guarding.  Musculoskeletal:     Cervical back: Neck supple.     Comments: Pain with lumbar flexion at 45 degrees.  Also some pain at the mid back with rotation to the right.  Nontender directly over the spine.  Lymphadenopathy:     Cervical: No cervical adenopathy.  Skin:    General: Skin is  warm and dry.     Coloration: Skin is not pale.  Neurological:     Mental Status: She is alert and oriented to person, place, and time.  Psychiatric:        Behavior: Behavior normal.      Results for orders placed or performed in visit on 04/07/22  POCT URINALYSIS DIP (CLINITEK)  Result Value Ref Range   Color, UA yellow yellow   Clarity, UA clear clear   Glucose, UA negative negative mg/dL   Bilirubin, UA small (A) negative   Ketones, POC UA trace (5) (A) negative mg/dL   Spec Grav, UA >=1.030 (A) 1.010 - 1.025   Blood, UA negative negative   pH, UA 5.5 5.0 - 8.0   POC PROTEIN,UA =30 (A) negative, trace   Urobilinogen, UA 0.2 0.2 or 1.0 E.U./dL   Nitrite, UA Negative Negative   Leukocytes, UA Negative Negative      The ASCVD Risk score (Arnett DK, et al., 2019) failed to calculate for the following reasons:   The patient has a prior MI or stroke diagnosis    Assessment & Plan:   Problem List Items  Addressed This Visit       Digestive   Dysphagia   Relevant Orders   Ambulatory referral to ENT   Other Visit Diagnoses     Acute non-recurrent sinusitis, unspecified location    -  Primary   Relevant Medications   clindamycin (CLEOCIN) 75 MG capsule   Left hip pain       Relevant Orders   Ambulatory referral to Physical Therapy   Acute pain of left knee       Relevant Orders   Ambulatory referral to Physical Therapy   Acute midline low back pain without sciatica       Relevant Orders   Ambulatory referral to Physical Therapy   POCT URINALYSIS DIP (CLINITEK) (Completed)   Globus sensation       Relevant Orders   Ambulatory referral to ENT      acute sinusitis-we will treat with clindamycin call if not better in 1 week.  Left hip and knee pain-we will update physical therapy referral to also include mid low back as well as water therapy.  Midline low back pain-given handout with exercises to do on her own over the weekend recommend using a heating pad and doing some gentle stretches and will work on getting formal PT.  Difficulty with swallowing and globus sensation-we will plan to refer to ENT at [facility] she was seen there years ago.  No follow-ups on file.   I spent 40 minutes on the day of the encounter to include pre-visit record review, face-to-face time with the patient and post visit ordering of test.  Beatrice Lecher, MD
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
## 2022-04-07 NOTE — Telephone Encounter (Signed)
Transition Care Management Follow-up Telephone Call Date of discharge and from where: 04/05/22 from [facility] How have you been since you were released from the hospital? Patient has an appt today with PCP Any questions or concerns? No

## 2022-04-08 DIAGNOSIS — S2231XA Fracture of one rib, right side, initial encounter for closed fracture: Secondary | ICD-10-CM | POA: Diagnosis not present

## 2022-04-08 DIAGNOSIS — Z20822 Contact with and (suspected) exposure to covid-19: Secondary | ICD-10-CM | POA: Diagnosis not present

## 2022-04-08 DIAGNOSIS — R079 Chest pain, unspecified: Secondary | ICD-10-CM | POA: Diagnosis not present

## 2022-04-08 DIAGNOSIS — J029 Acute pharyngitis, unspecified: Secondary | ICD-10-CM | POA: Diagnosis not present

## 2022-04-09 DIAGNOSIS — R000 Tachycardia, unspecified: Secondary | ICD-10-CM | POA: Diagnosis not present

## 2022-04-09 DIAGNOSIS — R0602 Shortness of breath: Secondary | ICD-10-CM | POA: Diagnosis not present

## 2022-04-10 ENCOUNTER — Telehealth: Payer: Self-pay | Admitting: General Practice

## 2022-04-10 ENCOUNTER — Ambulatory Visit (INDEPENDENT_AMBULATORY_CARE_PROVIDER_SITE_OTHER): Payer: Medicare Other | Admitting: Internal Medicine

## 2022-04-10 ENCOUNTER — Encounter: Payer: Self-pay | Admitting: Internal Medicine

## 2022-04-10 VITALS — BP 158/88 | HR 101 | Temp 97.9°F | Resp 20

## 2022-04-10 DIAGNOSIS — J4540 Moderate persistent asthma, uncomplicated: Secondary | ICD-10-CM | POA: Diagnosis not present

## 2022-04-10 DIAGNOSIS — K219 Gastro-esophageal reflux disease without esophagitis: Secondary | ICD-10-CM | POA: Diagnosis not present

## 2022-04-10 DIAGNOSIS — J3089 Other allergic rhinitis: Secondary | ICD-10-CM

## 2022-04-10 DIAGNOSIS — J387 Other diseases of larynx: Secondary | ICD-10-CM

## 2022-04-10 NOTE — Progress Notes (Signed)
Follow Up Note  RE: Jennifer [name] MRN: [id number] DOB: 1965/10/10 Date of Office Visit: 04/10/2022  Referring provider: [name], * Primary care provider: [name], MD  Chief Complaint: Shortness of Breath (With walking. Flu and Covid negative 2 days ago. Chest xray normal. ) and Cough (Coughing to the point of vomiting )  History of Present Illness: I had the pleasure of seeing Jennifer [name] for a follow up visit at the [facility] on 04/11/2022. She is a 56 y.o. female, who is being followed for persistent asthma, reflux, VCD muscle dysphonia, rhinitis. Her previous allergy office visit was on 02/23/2022 with Althea Charon FNP. Today is a  acute visit .  History obtained from patient, chart review.  She is a frequent patient of our clinic who has been difficult to manage due to perceived medication intolerances to multiple medications and therapies.  She was tearful throughout appointment and just overall endorses a feeling of decreased health status.  She endorses persistent shortness of breath which she describes as acute onset and associated with coughing.  Often triggered by eating.  She reports multiple medical intolerances and allergies to almost all inhalers and financial constraints to picking up inhalers.  She has had multiple ER visits which are summarized below.  As well as the symptoms she also endorses overall decreased exercise tolerance since having COVID.  She also had significant weight gain.  She is using her nasal sprays (flunisolide) and sinus rinses. She is not using rinses every day because it can cause build up of fluid and pressure in her ears.   She does have reflux.  She is taking pepcid because is unable to tolerate PPI.  She is taking pepcid 20 twice daily.  She had and EGD in august and biopsy results are below.  She was supposed to have a colonoscopy, but it was not performed due to abdominal pain.  She was started  on dexilant , but did not start due to it being extended release.  Currently on omeprazole.  She previously reports better controlled with ranitidine but this is no longer on the market.   Chart review shows ER visits twice on 04/08/2022: Normal vital signs, normal physical exam, normal chest x-ray.  She complained of sore throat difficulty breathing and back pain.  She also had an ER visit on 04/05/2022 for globus sensation with normal work up and physical exam. Discharged after passing PO challenge with ginger ale.   She had ER visit on 03/30/2022, 03/28/2022, 03/27/2022 all for abdominal pain with normal GI work-up.  She had an ER visit on 03/21/2022 and 03/16/2022 for chest pain  She had ER visit on 03/20/2022 and 03/18/2022 for abdominal pain  Assessment and Plan: [name] is a 56 y.o. female with: Moderate persistent asthma without complication - Plan: Spirometry with Graph  Gastroesophageal reflux disease without esophagitis  Inducible laryngeal obstruction (ILO) Plan: Patient Instructions  Asthma Breathing test today showed: looks great!  I do not think the cause of your coughing is from asthma  Recommend gentle reintroduction to exercise like walking  Continue Xopenex 2 puffs every 6 hours as needed for cough, wheeze,tightness in chest, or shortness of breath OR Xopenex 1.25 mg 1 unit dose via nebulizer every 4-6 hours as needed Continue to use aerobika valve 1-2 times a day for 5-10 minutes  We can hold off on any steroid inhalers given your previous reactions and that your asthma is unlikely driving  your symptoms    Reflux Continue dietary lifestyle modifications as listed below Continue the regimen as prescribed by your gastroenterologist. Discuss alternative options for dexilant It is important to follow-up for your colonoscopy  Chronic rhinitis Continue flunisolide nasal spray 2 sprays in each nostril once a day a day as needed for stuffy nose.  In the right nostril, point the  applicator out toward the right ear. 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  Don't hesitate to reach out if you have any additional concerns!  Roney Marion, MD  [facility] [place]  Total Time: "70 minutes"  Time spent on day of service preparing to see patient, obtaining and reviewing separately obtained history, performing examination, counseling and educating patient, ordering medications, tests and procedures, , documenting clinical information in the health record, independently interpreting results and communicating to patient.   Meds ordered this encounter  Medications   flunisolide (NASALIDE) 25 MCG/ACT (0.025%) SOLN    Sig: Place 2 sprays into the nose daily as needed (for stuffy nose).    Dispense:  25 mL    Refill:  5   levalbuterol (XOPENEX HFA) 45 MCG/ACT inhaler    Sig: INHALE 2 PUFFS INTO THE LUNGS EVERY 6 HOURS AS NEEDED FOR WHEEZING    Dispense:  45 g    Refill:  1    Lab Orders  No laboratory test(s) ordered today   Diagnostics: Spirometry:  Tracings reviewed. Her effort: Good reproducible efforts. FVC: 2.45 L FEV1: 3.04 L, 82% predicted FEV1/FVC ratio: 81% Interpretation: Spirometry consistent with normal pattern.  Please see scanned  spirometry results for details.   Medication List:  Current Outpatient Medications  Medication Sig Dispense Refill   ACCU-CHEK GUIDE test strip For testing blood sugars dailyDx:r73.03 100 each 11   bisacodyl (DULCOLAX) 5 MG EC tablet Take by mouth.     EPINEPHrine 0.3 mg/0.3 mL IJ SOAJ injection Use as directed for severe allergic reactions 2 each 1   famotidine (PEPCID) 20 MG tablet Take by mouth.     flunisolide (NASALIDE) 25 MCG/ACT (0.025%) SOLN Place 2 sprays into the nose daily as needed (for stuffy nose). 25 mL 5   metoprolol tartrate (LOPRESSOR) 50 MG tablet Take 1 tablet (50 mg total) by mouth 2 (two) times daily. 180 tablet 1   levalbuterol (XOPENEX HFA) 45 MCG/ACT inhaler INHALE 2 PUFFS INTO THE LUNGS EVERY 6 HOURS AS NEEDED FOR WHEEZING 45 g 1   No current facility-administered medications for this visit.   Allergies: Allergies  Allergen Reactions   Azithromycin Shortness Of Breath    Lip swelling, SOB.      Ciprofloxacin Swelling    REACTION: tongue swells  Codeine Shortness Of Breath   Erythromycin Base Itching and Rash   Sulfa Antibiotics Shortness Of Breath, Rash and Other (See Comments)   Sulfasalazine Rash and Shortness Of Breath    Other reaction(s): Other (See Comments) Other reaction(s): SHORTNESS OF BREATH   Telmisartan Swelling    Tongue swelling, Micardis   Ace Inhibitors Cough   Aspirin Hives and Other (See Comments)    flushing   Atenolol Other (See Comments)    Squeezing chest sensation   Avelox [Moxifloxacin Hcl In Nacl] Itching        Beta Adrenergic Blockers Other (See Comments)    Feels like chest tightening labetalol, bystolic  Feels like chest tightening "Metoprolol"    Buspar [Buspirone] Other (See Comments)    Light headed   Butorphanol Tartrate Other (See Comments)    Patient aggitated   Cetirizine Hives and Rash        Clonidine Hcl     REACTION: makes blood pressure high   Cortisone     Feels like she is going crazy    Erythromycin Rash   Fentanyl Other (See Comments)    aggressive    Fluoxetine Hcl Other (See Comments)    REACTION: headaches   Ketorolac Tromethamine     jittery   Lidocaine Other (See Comments)    When it involves the throat,    Lisinopril Cough   Metoclopramide Hcl Other (See Comments)    Dystonic reaction   Midazolam Other (See Comments)    agitation Slow to wake up   Montelukast Other (See Comments)    Singulair   Montelukast Sodium Other (See Comments)    DOES NOT REMEMBER  Don't remember-told not to take   Naproxen Other (See Comments)    FLUSHING Pt states she took Ibuprofen today (10/08/19)   Paroxetine Other (See Comments)    REACTION: headaches   Penicillins Rash   Pravastatin Other (See Comments)    Myalgias   Promethazine Other (See Comments)    Dystonic reaction   Promethazine Hcl Other (See Comments)    jittery   Quinolones Swelling and Rash   Serotonin Reuptake Inhibitors (Ssris) Other (See Comments)    Headache Effexor, prozac, zoloft,    Sertraline Hcl     REACTION: headaches   Stelazine [Trifluoperazine] Other (See Comments)    Dystonic reaction   Tobramycin Itching and Rash   Trifluoperazine Hcl     dystonic   Atrovent Nasal Spray [Ipratropium]     Tachycardia and shaking   Diltiazem Other (See Comments)    Chest pain   Iodinated Contrast Media     Other reaction(s): Other (See Comments) Chest heaviness/sob   Polyethylene Glycol 3350     Other reaction(s): Laryngeal Edema (ALLERGY)   Propoxyphene    Adhesive [Tape] Rash    EKG monitor patches, some tapes Blisters, rash, itching, welts.   Butorphanol Anxiety    Patient agitated   Ceftriaxone Rash    rocephin   Iron Rash    Flushing with certain IV types   Metoclopramide Itching and Other (See Comments)    Dystonic reaction   Metronidazole Rash   Other Rash and Other (See Comments)    Uncoded Allergy. Allergen: steriods, Other Reaction: Not Assessed Other reaction(s): Flushing  (ALLERGY/intolerance), GI Upset (intolerance), Hypertension (intolerance), Increased Heart Rate (intolerance), Mental Status Changes (intolerance), Other (See Comments), Tachycardia / Palpitations  (intolerance) Hospital gowns leave a rash.    Prednisone Anxiety and Palpitations   Prochlorperazine Anxiety  Compazine:  Dystonic reaction   Venlafaxine Anxiety   Zyrtec [Cetirizine Hcl] Rash    All over body   I reviewed her past medical history, social history, family history, and environmental history and no significant changes have been reported from her previous visit.  ROS: All others negative except as noted per HPI.   Objective: BP (!) 158/88   Pulse (!) 101   Temp 97.9 F (36.6 C) (Temporal)   Resp 20   LMP 06/25/2013   PF 98 L/min  There is no height or weight on file to calculate BMI. General Appearance:  Alert, cooperative, tearful throughout appointment, appears stated age  Head:  Normocephalic, without obvious abnormality, atraumatic  Eyes:  Conjunctiva clear, EOM's intact  Nose: Nares normal, hypertrophic turbinates, no visible anterior polyps, and septum midline  Throat: Lips, tongue normal; teeth and gums normal, + cobblestoning  Neck: Supple, symmetrical  Lungs:   clear to auscultation bilaterally, Respirations unlabored, no coughing  Heart:  regular rate and rhythm and no murmur, Appears well perfused  Extremities: No edema  Skin: Skin color, texture, turgor normal, no rashes or lesions on visualized portions of skin   Neurologic: No gross deficits   Previous notes and tests were reviewed. The plan was reviewed with the patient/family, and all questions/concerned were addressed.  It was my pleasure to see Myrtis today and participate in her care. Please feel free to contact me with any questions or concerns.  Sincerely,  Roney Marion, MD  Allergy & Immunology  [facility] [place] [facility] Office: [phone]

## 2022-04-10 NOTE — Telephone Encounter (Signed)
Transition Care Management Unsuccessful Follow-up Telephone Call  Date of discharge and from where:  04/09/22 from [facility]  Attempts:  1st Attempt  Reason for unsuccessful TCM follow-up call:  No answer/busy

## 2022-04-10 NOTE — Patient Instructions (Signed)
Asthma Breathing test today showed: looks great!  I do not think the cause of your coughing is from asthma  Recommend gentle reintroduction to exercise like walking  Continue Xopenex 2 puffs every 6 hours as needed for cough, wheeze,tightness in chest, or shortness of breath OR Xopenex 1.25 mg 1 unit dose via nebulizer every 4-6 hours as needed Continue to use aerobika valve 1-2 times a day for 5-10 minutes  We can hold off on any steroid inhalers given your previous reactions and that your asthma is unlikely driving your symptoms    Reflux Continue dietary lifestyle modifications as listed below Continue the regimen as prescribed by your gastroenterologist. Discuss alternative options for dexilant It is important to follow-up for your colonoscopy  Chronic rhinitis Continue flunisolide nasal spray 2 sprays in each nostril once a day a day as needed for stuffy nose.  In the right nostril, point the applicator out toward the right ear. 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  Don't hesitate to reach out if you have any additional concerns!  Roney Marion, MD  [facility] [place] [place]

## 2022-04-11 MED ORDER — FLUNISOLIDE 25 MCG/ACT (0.025%) NA SOLN: 2.0000 | Freq: Every day | NASAL | 5 refills | Status: DC | PRN

## 2022-04-11 MED ORDER — LEVALBUTEROL TARTRATE 45 MCG/ACT IN AERO
INHALATION_SPRAY | RESPIRATORY_TRACT | 1 refills | Status: DC
Start: 2022-04-11 — End: 2022-07-21

## 2022-04-12 DIAGNOSIS — R000 Tachycardia, unspecified: Secondary | ICD-10-CM | POA: Diagnosis not present

## 2022-04-12 DIAGNOSIS — R002 Palpitations: Secondary | ICD-10-CM | POA: Diagnosis not present

## 2022-04-12 IMAGING — DX DG CHEST 2V
2 series · 2 of 2 positions shown · non-contrast
Comparison: January 12, 2021

CLINICAL DATA: Cough and right lung pain.

EXAM:
CHEST - 2 VIEW

[chest pa]
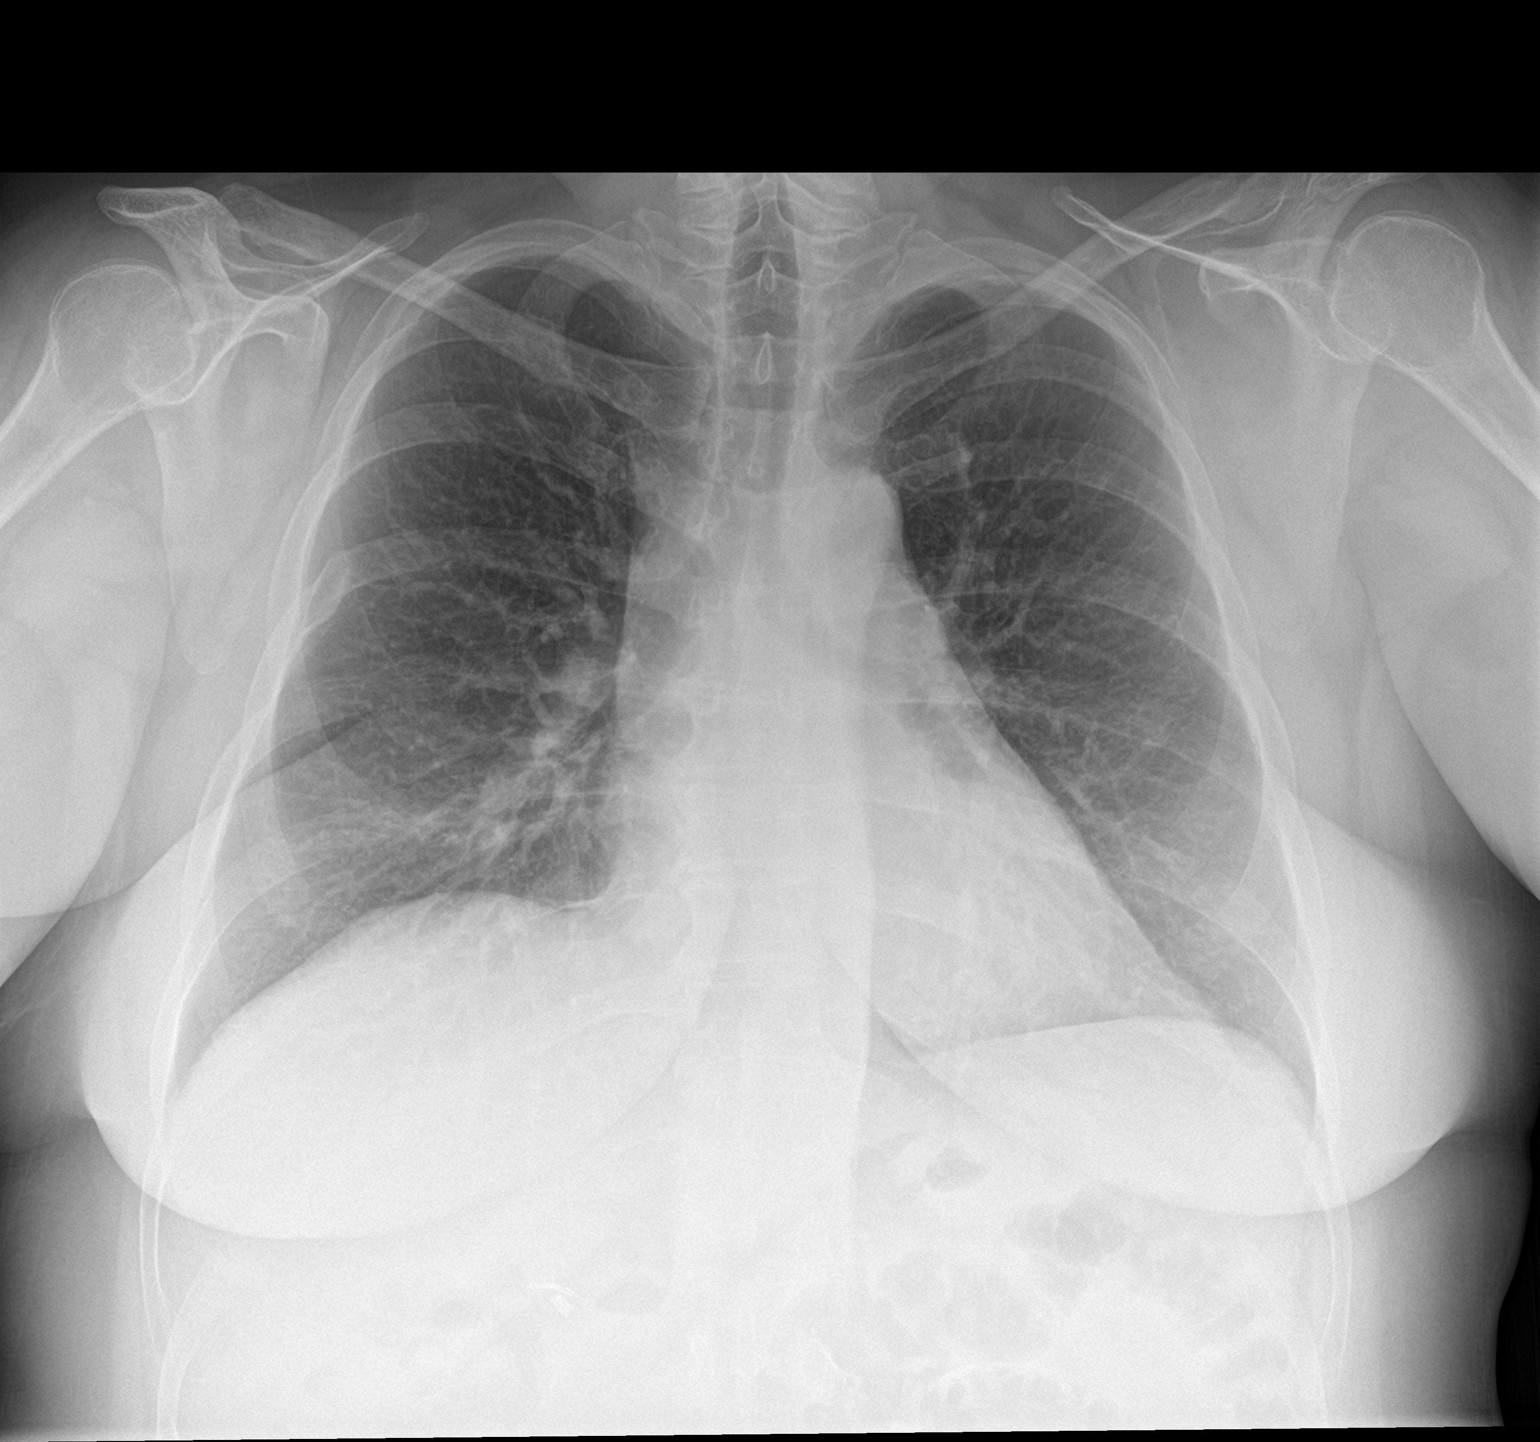

[chest lat]
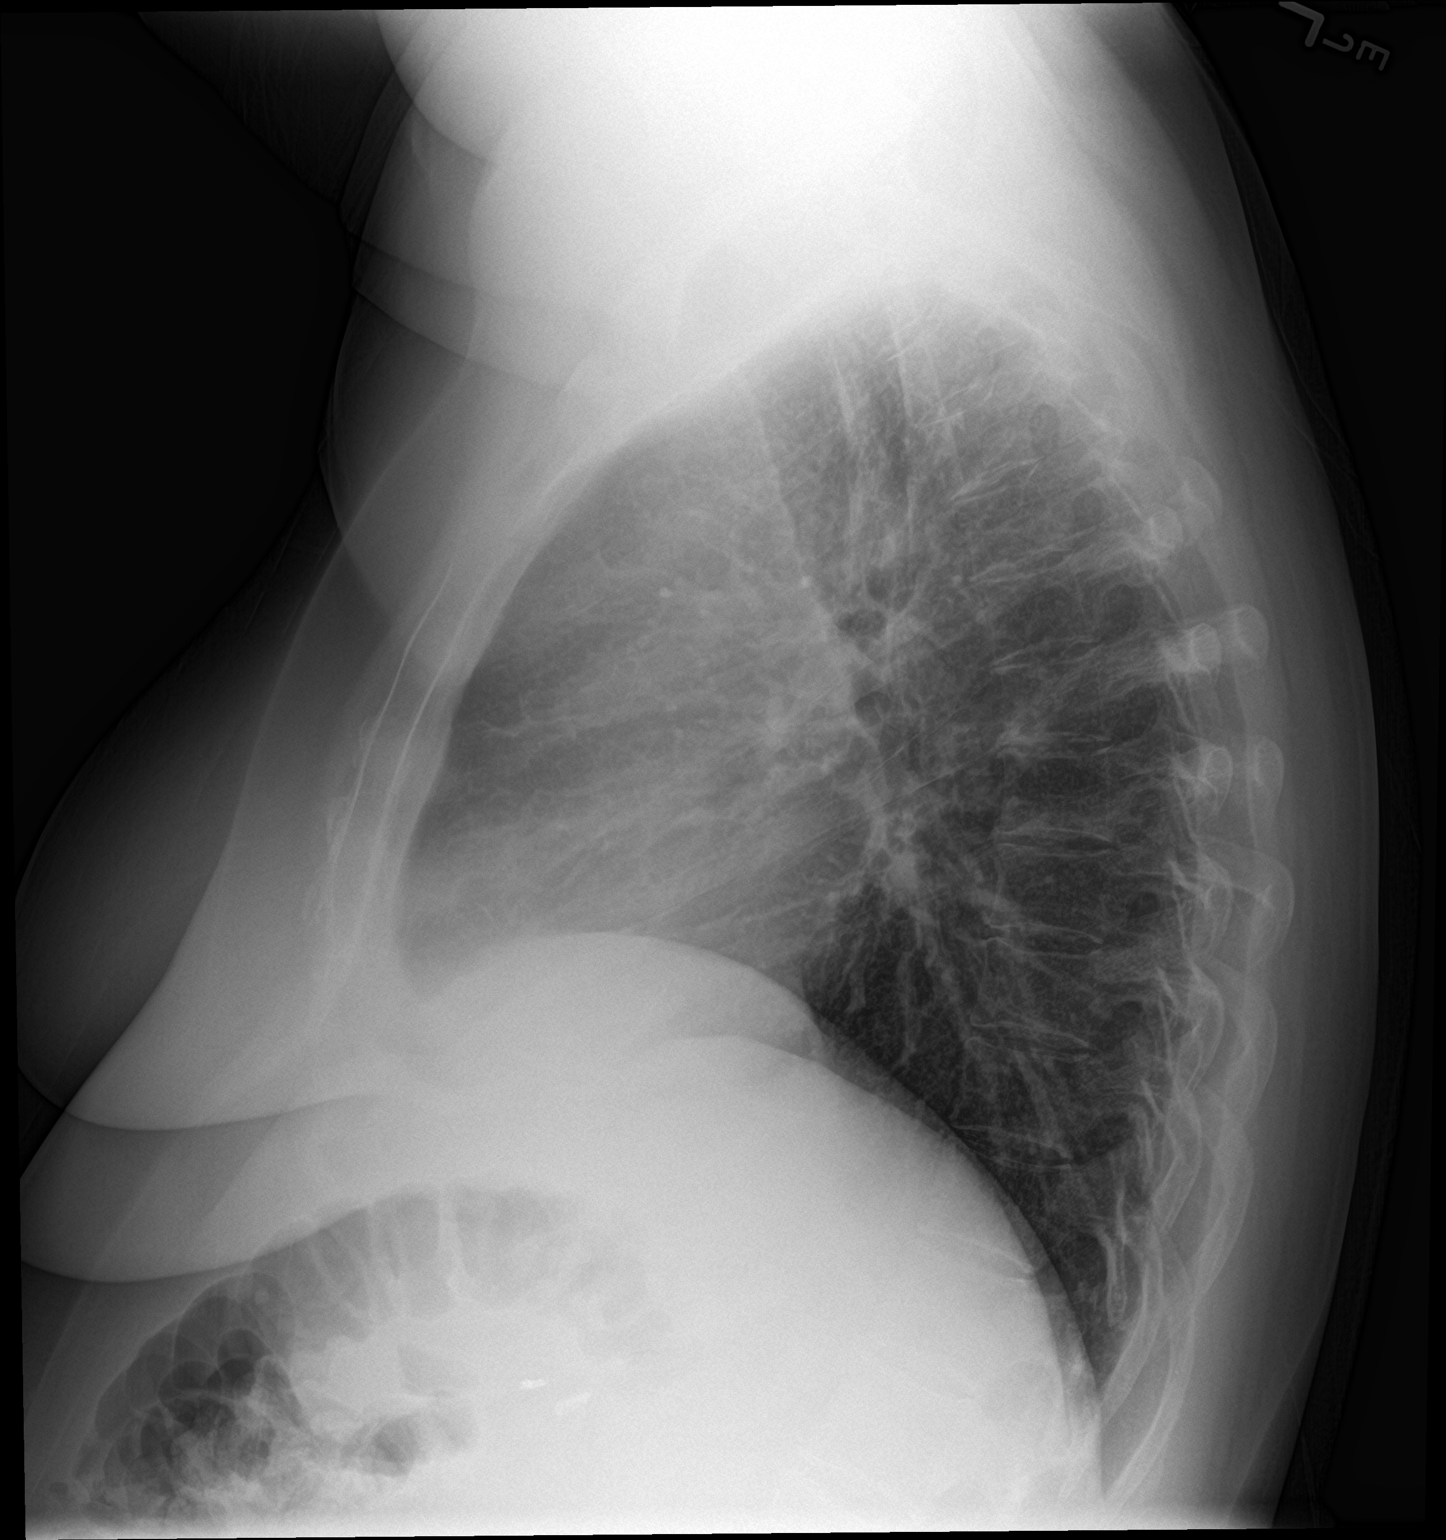

[2 of 2 positions shown; findings below may reference images not displayed]

FINDINGS: Mild atelectasis and/or early infiltrate is seen within the right
lung base. There is no evidence of a pleural effusion or
pneumothorax. The heart size and mediastinal contours are within
normal limits. A chronic sixth right rib fracture is seen.
IMPRESSION: Very mild right basilar atelectasis and/or early infiltrate.

## 2022-04-12 NOTE — Telephone Encounter (Signed)
Transition Care Management Unsuccessful Follow-up Telephone Call  Date of discharge and from where:  04/09/22 from [facility] med center  Attempts:  1st Attempt  Reason for unsuccessful TCM follow-up call:  No answer/busy

## 2022-04-13 DIAGNOSIS — I209 Angina pectoris, unspecified: Secondary | ICD-10-CM | POA: Diagnosis not present

## 2022-04-13 DIAGNOSIS — R000 Tachycardia, unspecified: Secondary | ICD-10-CM | POA: Diagnosis not present

## 2022-04-14 ENCOUNTER — Telehealth: Payer: Self-pay | Admitting: General Practice

## 2022-04-14 DIAGNOSIS — R200 Anesthesia of skin: Secondary | ICD-10-CM | POA: Diagnosis not present

## 2022-04-14 DIAGNOSIS — R002 Palpitations: Secondary | ICD-10-CM | POA: Diagnosis not present

## 2022-04-14 DIAGNOSIS — R000 Tachycardia, unspecified: Secondary | ICD-10-CM | POA: Diagnosis not present

## 2022-04-14 DIAGNOSIS — R0789 Other chest pain: Secondary | ICD-10-CM | POA: Diagnosis not present

## 2022-04-14 DIAGNOSIS — R202 Paresthesia of skin: Secondary | ICD-10-CM | POA: Diagnosis not present

## 2022-04-14 DIAGNOSIS — R0602 Shortness of breath: Secondary | ICD-10-CM | POA: Diagnosis not present

## 2022-04-14 NOTE — Telephone Encounter (Signed)
Transition Care Management Unsuccessful Follow-up Telephone Call  Date of discharge and from where:  04/13/22 from [facility]  Attempts:  1st Attempt  Reason for unsuccessful TCM follow-up call:  No answer/busy patient has an appt scheduled for today with PCP.

## 2022-04-14 NOTE — Telephone Encounter (Signed)
Transition Care Management Unsuccessful Follow-up Telephone Call  Date of discharge and from where:  04/09/22 from [facility]  Attempts:  3rd Attempt  Reason for unsuccessful TCM follow-up call:  No answer/busy

## 2022-04-15 DIAGNOSIS — R002 Palpitations: Secondary | ICD-10-CM | POA: Diagnosis not present

## 2022-04-17 DIAGNOSIS — E785 Hyperlipidemia, unspecified: Secondary | ICD-10-CM | POA: Diagnosis not present

## 2022-04-17 DIAGNOSIS — Z87891 Personal history of nicotine dependence: Secondary | ICD-10-CM | POA: Diagnosis not present

## 2022-04-17 DIAGNOSIS — Z7951 Long term (current) use of inhaled steroids: Secondary | ICD-10-CM | POA: Diagnosis not present

## 2022-04-17 DIAGNOSIS — Z91010 Allergy to peanuts: Secondary | ICD-10-CM | POA: Diagnosis not present

## 2022-04-17 DIAGNOSIS — E119 Type 2 diabetes mellitus without complications: Secondary | ICD-10-CM | POA: Diagnosis not present

## 2022-04-17 DIAGNOSIS — G4730 Sleep apnea, unspecified: Secondary | ICD-10-CM | POA: Diagnosis not present

## 2022-04-17 DIAGNOSIS — G35 Multiple sclerosis: Secondary | ICD-10-CM | POA: Diagnosis not present

## 2022-04-17 DIAGNOSIS — Z888 Allergy status to other drugs, medicaments and biological substances status: Secondary | ICD-10-CM | POA: Diagnosis not present

## 2022-04-17 DIAGNOSIS — R000 Tachycardia, unspecified: Secondary | ICD-10-CM | POA: Diagnosis not present

## 2022-04-17 DIAGNOSIS — R0600 Dyspnea, unspecified: Secondary | ICD-10-CM | POA: Diagnosis not present

## 2022-04-17 DIAGNOSIS — Z91018 Allergy to other foods: Secondary | ICD-10-CM | POA: Diagnosis not present

## 2022-04-17 DIAGNOSIS — Z79899 Other long term (current) drug therapy: Secondary | ICD-10-CM | POA: Diagnosis not present

## 2022-04-17 DIAGNOSIS — R051 Acute cough: Secondary | ICD-10-CM | POA: Diagnosis not present

## 2022-04-17 DIAGNOSIS — Z743 Need for continuous supervision: Secondary | ICD-10-CM | POA: Diagnosis not present

## 2022-04-17 DIAGNOSIS — Z882 Allergy status to sulfonamides status: Secondary | ICD-10-CM | POA: Diagnosis not present

## 2022-04-17 DIAGNOSIS — R9431 Abnormal electrocardiogram [ECG] [EKG]: Secondary | ICD-10-CM | POA: Diagnosis not present

## 2022-04-17 DIAGNOSIS — J029 Acute pharyngitis, unspecified: Secondary | ICD-10-CM | POA: Diagnosis not present

## 2022-04-17 DIAGNOSIS — I10 Essential (primary) hypertension: Secondary | ICD-10-CM | POA: Diagnosis not present

## 2022-04-17 DIAGNOSIS — R6889 Other general symptoms and signs: Secondary | ICD-10-CM | POA: Diagnosis not present

## 2022-04-17 DIAGNOSIS — K219 Gastro-esophageal reflux disease without esophagitis: Secondary | ICD-10-CM | POA: Diagnosis not present

## 2022-04-17 DIAGNOSIS — M7989 Other specified soft tissue disorders: Secondary | ICD-10-CM | POA: Diagnosis not present

## 2022-04-17 DIAGNOSIS — R0602 Shortness of breath: Secondary | ICD-10-CM | POA: Diagnosis not present

## 2022-04-17 DIAGNOSIS — J984 Other disorders of lung: Secondary | ICD-10-CM | POA: Diagnosis not present

## 2022-04-18 DIAGNOSIS — J45909 Unspecified asthma, uncomplicated: Secondary | ICD-10-CM | POA: Diagnosis not present

## 2022-04-18 DIAGNOSIS — G4733 Obstructive sleep apnea (adult) (pediatric): Secondary | ICD-10-CM | POA: Diagnosis not present

## 2022-04-18 DIAGNOSIS — I10 Essential (primary) hypertension: Secondary | ICD-10-CM | POA: Diagnosis not present

## 2022-04-18 DIAGNOSIS — R051 Acute cough: Secondary | ICD-10-CM | POA: Diagnosis not present

## 2022-04-18 DIAGNOSIS — R0602 Shortness of breath: Secondary | ICD-10-CM | POA: Diagnosis not present

## 2022-04-18 DIAGNOSIS — R000 Tachycardia, unspecified: Secondary | ICD-10-CM | POA: Diagnosis not present

## 2022-04-18 DIAGNOSIS — R079 Chest pain, unspecified: Secondary | ICD-10-CM | POA: Diagnosis not present

## 2022-04-19 ENCOUNTER — Emergency Department (HOSPITAL_BASED_OUTPATIENT_CLINIC_OR_DEPARTMENT_OTHER)
Admission: EM | Admit: 2022-04-19 | Discharge: 2022-04-19 | Disposition: A | Discharge status: Home / Self Care | Payer: Medicare Other | Attending: Emergency Medicine | Admitting: Emergency Medicine

## 2022-04-19 ENCOUNTER — Emergency Department (HOSPITAL_BASED_OUTPATIENT_CLINIC_OR_DEPARTMENT_OTHER): Payer: Medicare Other

## 2022-04-19 ENCOUNTER — Encounter (HOSPITAL_BASED_OUTPATIENT_CLINIC_OR_DEPARTMENT_OTHER): Payer: Self-pay

## 2022-04-19 ENCOUNTER — Telehealth: Payer: Self-pay | Admitting: General Practice

## 2022-04-19 ENCOUNTER — Encounter: Payer: Self-pay | Admitting: Family Medicine

## 2022-04-19 DIAGNOSIS — R1084 Generalized abdominal pain: Secondary | ICD-10-CM | POA: Diagnosis not present

## 2022-04-19 DIAGNOSIS — R0789 Other chest pain: Secondary | ICD-10-CM | POA: Diagnosis not present

## 2022-04-19 DIAGNOSIS — Z79899 Other long term (current) drug therapy: Secondary | ICD-10-CM | POA: Diagnosis not present

## 2022-04-19 DIAGNOSIS — J45909 Unspecified asthma, uncomplicated: Secondary | ICD-10-CM | POA: Diagnosis not present

## 2022-04-19 DIAGNOSIS — I10 Essential (primary) hypertension: Secondary | ICD-10-CM | POA: Insufficient documentation

## 2022-04-19 DIAGNOSIS — Z8542 Personal history of malignant neoplasm of other parts of uterus: Secondary | ICD-10-CM | POA: Diagnosis not present

## 2022-04-19 DIAGNOSIS — R002 Palpitations: Secondary | ICD-10-CM | POA: Diagnosis not present

## 2022-04-19 DIAGNOSIS — I491 Atrial premature depolarization: Secondary | ICD-10-CM | POA: Diagnosis not present

## 2022-04-19 DIAGNOSIS — R1013 Epigastric pain: Secondary | ICD-10-CM | POA: Diagnosis not present

## 2022-04-19 DIAGNOSIS — R079 Chest pain, unspecified: Secondary | ICD-10-CM | POA: Diagnosis present

## 2022-04-19 DIAGNOSIS — R000 Tachycardia, unspecified: Secondary | ICD-10-CM | POA: Diagnosis not present

## 2022-04-19 LAB — HEPATIC FUNCTION PANEL
ALT: 27 U/L (ref 0–44)
AST: 20 U/L (ref 15–41)
Albumin: 3.6 g/dL (ref 3.5–5.0)
Alkaline Phosphatase: 65 U/L (ref 38–126)
Bilirubin, Direct: <0.1 mg/dL (ref 0.0–0.2)
Total Bilirubin: 0.4 mg/dL (ref 0.3–1.2)
Total Protein: 6.8 g/dL (ref 6.5–8.1)

## 2022-04-19 LAB — URINALYSIS, ROUTINE W REFLEX MICROSCOPIC
Bilirubin Urine: NEGATIVE
Glucose, UA: NEGATIVE mg/dL
Hgb urine dipstick: NEGATIVE
Ketones, ur: NEGATIVE mg/dL
Leukocytes,Ua: NEGATIVE
Nitrite: NEGATIVE
Protein, ur: NEGATIVE mg/dL
Specific Gravity, Urine: 1.025 (ref 1.005–1.030)
pH: 5 (ref 5.0–8.0)

## 2022-04-19 LAB — BASIC METABOLIC PANEL
Anion gap: 7 (ref 5–15)
BUN: 19 mg/dL (ref 6–20)
CO2: 23 mmol/L (ref 22–32)
Calcium: 8.6 mg/dL — ABNORMAL LOW (ref 8.9–10.3)
Chloride: 111 mmol/L (ref 98–111)
Creatinine, Ser: 0.6 mg/dL (ref 0.44–1.00)
GFR, Estimated: >60 mL/min (ref >60)
Glucose, Bld: 93 mg/dL (ref 70–99)
Potassium: 3.8 mmol/L (ref 3.5–5.1)
Sodium: 141 mmol/L (ref 135–145)

## 2022-04-19 LAB — TROPONIN I (HIGH SENSITIVITY): Troponin I (High Sensitivity): 4 ng/L (ref <18)

## 2022-04-19 LAB — CBC
HCT: 40.1 % (ref 36.0–46.0)
Hemoglobin: 13.1 g/dL (ref 12.0–15.0)
MCH: 29.2 pg (ref 26.0–34.0)
MCHC: 32.7 g/dL (ref 30.0–36.0)
MCV: 89.5 fL (ref 80.0–100.0)
Platelets: 177 10*3/uL (ref 150–400)
RBC: 4.48 MIL/uL (ref 3.87–5.11)
RDW: 13.9 % (ref 11.5–15.5)
WBC: 7.1 10*3/uL (ref 4.0–10.5)
nRBC: 0 % (ref 0.0–0.2)

## 2022-04-19 LAB — TSH: TSH: 2.11 u[IU]/mL (ref 0.350–4.500)

## 2022-04-19 LAB — LIPASE, BLOOD: Lipase: 38 U/L (ref 11–51)

## 2022-04-19 MED ORDER — DICYCLOMINE HCL 20 MG PO TABS: 20.0000 mg | ORAL_TABLET | Freq: Two times a day (BID) | ORAL | 0 refills | Status: DC

## 2022-04-19 NOTE — ED Provider Notes (Signed)
[place] EMERGENCY DEPARTMENT Provider Note   CSN: [id number] Arrival date & time: 04/19/22  1152     History  Chief Complaint  Patient presents with   Chest Pain    Jennifer [name] is a 56 y.o. female.  Pt is a 56 yo female with a pmhx significant for PACs, PVCs, chronic headaches, OSA on CPAP, PTSD, seizures, HLD, GERD, asthma, anemia, uterine cancer, htn, ms, depression, and anxiety.  Pt is here frequently and has a care plan.  Pt was here on 8/30.  She went to [facility] on 9/1, 9/2, and 9/4.  She then went to [facility] in [place] on 9/4 as well.  She has an appt with cardiology on 9/8.  She is at the ED today because she continues to have some cp and some tachycardia.  She is worried she is constipated.  She took Dulcolax 2 weeks ago, but nothing for constipation lately.  Pt said she's been compliant with her meds.       Home Medications Prior to Admission medications   Medication Sig Start Date End Date Taking? Authorizing Provider  dicyclomine (BENTYL) 20 MG tablet Take 1 tablet (20 mg total) by mouth 2 (two) times daily. 04/19/22  Yes Isla Pence, MD  ACCU-CHEK GUIDE test strip For testing blood sugars dailyDx:r73.03 06/17/21   [name], MD  bisacodyl (DULCOLAX) 5 MG EC tablet Take by mouth.    [provider]  EPINEPHrine 0.3 mg/0.3 mL IJ SOAJ injection Use as directed for severe allergic reactions 11/10/21   Althea Charon, FNP  famotidine (PEPCID) 20 MG tablet Take by mouth. 05/06/21   [provider]  flunisolide (NASALIDE) 25 MCG/ACT (0.025%) SOLN Place 2 sprays into the nose daily as needed (for stuffy nose). 04/11/22   Roney Marion, MD  levalbuterol [facility] HFA) 45 MCG/ACT inhaler INHALE 2 PUFFS INTO THE LUNGS EVERY 6 HOURS AS NEEDED FOR WHEEZING 04/11/22   Roney Marion, MD  metoprolol tartrate (LOPRESSOR) 50 MG tablet Take 1 tablet (50 mg total) by mouth 2 (two) times daily. 11/24/21   [name], MD       Allergies    Azithromycin, Ciprofloxacin, Codeine, Erythromycin base, Sulfa antibiotics, Sulfasalazine, Telmisartan, Ace inhibitors, Aspirin, Atenolol, Avelox [moxifloxacin hcl in nacl], Beta adrenergic blockers, Buspar [buspirone], Butorphanol tartrate, Cetirizine, Clonidine hcl, Cortisone, Erythromycin, Fentanyl, Fluoxetine hcl, Ketorolac tromethamine, Lidocaine, Lisinopril, Metoclopramide hcl, Midazolam, Montelukast, Montelukast sodium, Naproxen, Paroxetine, Penicillins, Pravastatin, Promethazine, Promethazine hcl, Quinolones, Serotonin reuptake inhibitors (ssris), Sertraline hcl, Stelazine [trifluoperazine], Tobramycin, Trifluoperazine hcl, Atrovent nasal spray [ipratropium], Diltiazem, Iodinated contrast media, Polyethylene glycol 3350, Propoxyphene, Adhesive [tape], Butorphanol, Ceftriaxone, Iron, Metoclopramide, Metronidazole, Other, Prednisone, Prochlorperazine, Venlafaxine, and Zyrtec [cetirizine hcl]    Review of Systems   Review of Systems  Cardiovascular:  Positive for chest pain and palpitations.  Gastrointestinal:  Positive for abdominal pain and constipation.  All other systems reviewed and are negative.   Physical Exam Updated Vital Signs BP (!) 143/85   Pulse 81   Temp 98.5 F (36.9 C) (Oral)   Resp 15   Ht '5\' 2"'$  (1.575 m)   Wt 110.2 kg   LMP 06/25/2013   SpO2 98%   BMI 44.45 kg/m  Physical Exam Vitals and nursing note reviewed.  Constitutional:      Appearance: She is well-developed. She is obese.  HENT:     Head: Normocephalic and atraumatic.  Eyes:     Extraocular Movements: Extraocular movements intact.     Pupils: Pupils are equal,  round, and reactive to light.  Cardiovascular:     Rate and Rhythm: Normal rate and regular rhythm.     Heart sounds: Normal heart sounds.  Pulmonary:     Effort: Pulmonary effort is normal.     Breath sounds: Normal breath sounds.  Abdominal:     General: Bowel sounds are normal.     Palpations: Abdomen is soft.   Musculoskeletal:        General: Normal range of motion.     Cervical back: Normal range of motion and neck supple.  Skin:    General: Skin is warm.     Capillary Refill: Capillary refill takes less than 2 seconds.  Neurological:     General: No focal deficit present.     Mental Status: She is alert and oriented to person, place, and time.  Psychiatric:        Mood and Affect: Mood normal.        Behavior: Behavior normal.     ED Results / Procedures / Treatments   Labs (all labs ordered are listed, but only abnormal results are displayed) Labs Reviewed  CBC  BASIC METABOLIC PANEL  URINALYSIS, ROUTINE W REFLEX MICROSCOPIC  HEPATIC FUNCTION PANEL  LIPASE, BLOOD  TSH  TROPONIN I (HIGH SENSITIVITY)  TROPONIN I (HIGH SENSITIVITY)    EKG EKG Interpretation  Date/Time:  Wednesday April 19 2022 12:09:27 EDT Ventricular Rate:  96 PR Interval:  128 QRS Duration: 80 QT Interval:  340 QTC Calculation: 429 R Axis:   28 Text Interpretation: Normal sinus rhythm Nonspecific T wave abnormality Abnormal ECG When compared with ECG of 15-Mar-2022 20:53, PREVIOUS ECG IS PRESENT No significant change since last tracing Confirmed by Isla Pence [phone]) on 04/19/2022 1:12:34 PM  Radiology DG Abd 2 Views  Result Date: 04/19/2022 CLINICAL DATA:  Constipation, epigastric pain wrapping around to back with some intermittent vomiting, weight gain of 1-2 pounds in a week, abdominal distension, chest tightness and tachycardia EXAM: ABDOMEN - 2 VIEW COMPARISON:  02/16/2022 FINDINGS: Surgical clips RIGHT upper quadrant likely reflect prior cholecystectomy. Nonobstructive bowel gas pattern. No bowel dilatation, definite bowel wall thickening, or free air. Visualized lung bases clear. No urinary tract calcification. Osseous structures unremarkable. IMPRESSION: No acute abnormalities. Electronically Signed   By: Lavonia Dana M.D.   On: 04/19/2022 14:19    Procedures Procedures    Medications  Ordered in ED Medications - No data to display  ED Course/ Medical Decision Making/ A&P                           Medical Decision Making Amount and/or Complexity of Data Reviewed Labs: ordered. Radiology: ordered.   This patient presents to the ED for concern of cp and abd pain, this involves an extensive number of treatment options, and is a complaint that carries with it a high risk of complications and morbidity.  The differential diagnosis includes cardiac, pulm, gi   Co morbidities that complicate the patient evaluation  PACs, PVCs, chronic headaches, OSA on CPAP, PTSD, seizures, HLD, GERD, asthma, anemia, uterine cancer, htn, ms, depression, and anxiety   Additional history obtained:  Additional history obtained from epic chart review    Lab Tests:  I Ordered, and personally interpreted labs.  The pertinent results include:  cbc nl   Imaging Studies ordered:  I ordered imaging studies including kub  I independently visualized and interpreted imaging which showed  IMPRESSION:  No acute abnormalities.  I agree with the radiologist interpretation   Cardiac Monitoring:  The patient was maintained on a cardiac monitor.  I personally viewed and interpreted the cardiac monitored which showed an underlying rhythm of: nsr   Medicines ordered and prescription drug management:  I have reviewed the patients home medicines and have made adjustments as needed   Problem List / ED Course:  Atypical cp:  doubt cardiac.  Pt has had multiple recent evaluations with nl trops.  She has a cardiac appt on the 8th.  If labs nl, then pt can go home.   Reevaluation:  After the interventions noted above, I reevaluated the patient and found that they have :improved   Social Determinants of Health:  Lives at home   Dispostion:  After consideration of the diagnostic results and the patients response to treatment, I feel that the patent would benefit from discharge with  outpatient f/u.          Final Clinical Impression(s) / ED Diagnoses Final diagnoses:  Atypical chest pain  Generalized abdominal pain    Rx / DC Orders ED Discharge Orders          Ordered    dicyclomine (BENTYL) 20 MG tablet  2 times daily        04/19/22 1536              Isla Pence, MD 04/19/22 1537

## 2022-04-19 NOTE — ED Triage Notes (Signed)
States she has been having issues with chest tightness/tachycardia. States waiting to get into cardiologist. Also states she has been constipated, requesting imaging "of bowels".

## 2022-04-19 NOTE — Discharge Instructions (Addendum)
Take the dulcolax, colace, and simethicone as needed for gas pain and constipation.

## 2022-04-19 NOTE — Telephone Encounter (Signed)
Transition Care Management Unsuccessful Follow-up Telephone Call  Date of discharge and from where:  04/17/22 from [facility]  Attempts:  1st Attempt  Reason for unsuccessful TCM follow-up call:  No answer/busy

## 2022-04-19 NOTE — Telephone Encounter (Signed)
Transition Care Management Unsuccessful Follow-up Telephone Call  Date of discharge and from where:  04/13/22 from [facility]  Attempts:  2nd Attempt  Reason for unsuccessful TCM follow-up call:  No answer/busy

## 2022-04-20 DIAGNOSIS — I491 Atrial premature depolarization: Secondary | ICD-10-CM | POA: Diagnosis not present

## 2022-04-21 ENCOUNTER — Ambulatory Visit (INDEPENDENT_AMBULATORY_CARE_PROVIDER_SITE_OTHER): Payer: Medicare Other | Admitting: Family Medicine

## 2022-04-21 ENCOUNTER — Telehealth: Payer: Self-pay | Admitting: General Practice

## 2022-04-21 VITALS — BP 210/88 | HR 106 | Ht 62.0 in | Wt 240.0 lb

## 2022-04-21 DIAGNOSIS — R519 Headache, unspecified: Secondary | ICD-10-CM | POA: Diagnosis not present

## 2022-04-21 DIAGNOSIS — M25562 Pain in left knee: Secondary | ICD-10-CM

## 2022-04-21 DIAGNOSIS — I517 Cardiomegaly: Secondary | ICD-10-CM | POA: Diagnosis not present

## 2022-04-21 DIAGNOSIS — M25552 Pain in left hip: Secondary | ICD-10-CM | POA: Diagnosis not present

## 2022-04-21 DIAGNOSIS — Z87891 Personal history of nicotine dependence: Secondary | ICD-10-CM | POA: Diagnosis not present

## 2022-04-21 DIAGNOSIS — F411 Generalized anxiety disorder: Secondary | ICD-10-CM

## 2022-04-21 DIAGNOSIS — I10 Essential (primary) hypertension: Secondary | ICD-10-CM | POA: Diagnosis not present

## 2022-04-21 DIAGNOSIS — R002 Palpitations: Secondary | ICD-10-CM | POA: Diagnosis not present

## 2022-04-21 DIAGNOSIS — R000 Tachycardia, unspecified: Secondary | ICD-10-CM | POA: Diagnosis not present

## 2022-04-21 DIAGNOSIS — M7061 Trochanteric bursitis, right hip: Secondary | ICD-10-CM

## 2022-04-21 DIAGNOSIS — C519 Malignant neoplasm of vulva, unspecified: Secondary | ICD-10-CM

## 2022-04-21 DIAGNOSIS — I491 Atrial premature depolarization: Secondary | ICD-10-CM | POA: Diagnosis not present

## 2022-04-21 DIAGNOSIS — I471 Supraventricular tachycardia: Secondary | ICD-10-CM

## 2022-04-21 NOTE — Assessment & Plan Note (Signed)
She is try to work with her new insurance plan to try to find a therapist in the local area.

## 2022-04-21 NOTE — Assessment & Plan Note (Signed)
Is ongoing up to 50 mg twice a day on her metoprolol.  We will have another follow-up with the EP doc this fall.

## 2022-04-21 NOTE — Telephone Encounter (Signed)
Transition Care Management Unsuccessful Follow-up Telephone Call  Date of discharge and from where:  04/17/22 from [facility]  Attempts:  2nd Attempt  Reason for unsuccessful TCM follow-up call:  No answer/busy Follow up scheduled for today.

## 2022-04-21 NOTE — Progress Notes (Signed)
Established Patient Office Visit  Subjective   Patient ID: SANDA DEJOY, female    DOB: August 18, 1965  Age: 56 y.o. MRN: [id number]  No chief complaint on file.   HPI   Palpitations-just on cardiology today and saw the EP doc.  They did recommend that she go back up on her metoprolol to 50 mg twice a day and even consider adding a midday dose if needed.  She continues to gain weight pretty consistently over time.  Last TSH was a couple days ago and it looked great at 2.1.  She plans on getting in with endocrinology.  Right hip has been painful and bothering her she tried to walk to her the local museum and it was causing a lot of discomfort on the outside of her hip.  Pain was radiating into the right groin.  She was not able to get into physical therapy for her left hip and knee.  There was some going back and forth a I think they had seen her previously.  She has a history of vulvar Paget's disease and needs to get back in for routine follow-up its actually been probably a couple years since she has been seen since her surgery.  She would like to be seen by gynecology oncology at [facility].  She has felt like she has been under a lot more stress lately she feels like she is just been hitting a wall with trying to get appointments and get her husband's insurance straightened out.  He has had more headaches.    ROS    Objective:     BP (!) 175/98   Pulse (!) 106   Ht '5\' 2"'$  (1.575 m)   Wt 240 lb (108.9 kg)   LMP 06/25/2013   SpO2 97%   BMI 43.90 kg/m    Physical Exam Vitals and nursing note reviewed.  Constitutional:      Appearance: She is well-developed.  HENT:     Head: Normocephalic and atraumatic.  Cardiovascular:     Rate and Rhythm: Normal rate and regular rhythm.     Heart sounds: Normal heart sounds.  Pulmonary:     Effort: Pulmonary effort is normal.     Breath sounds: Normal breath sounds.  Skin:    General: Skin is warm and dry.  Neurological:      Mental Status: She is alert and oriented to person, place, and time.  Psychiatric:        Behavior: Behavior normal.      No results found for any visits on 04/21/22.    The ASCVD Risk score (Arnett DK, et al., 2019) failed to calculate for the following reasons:   The patient has a prior MI or stroke diagnosis    Assessment & Plan:   Problem List Items Addressed This Visit       Cardiovascular and Mediastinum   PAT (paroxysmal atrial tachycardia) (HCC)    Is ongoing up to 50 mg twice a day on her metoprolol.  We will have another follow-up with the EP doc this fall.      Hypertension with intolerance to multiple antihypertensive drugs    Uncontrolled here but she is can work on increasing her metoprolol that should help some.        Musculoskeletal and Integument   Paget's disease of vulva [facility])   Relevant Orders   Ambulatory referral to Gynecologic Oncology     Other   GAD (generalized anxiety disorder)  She is try to work with her new insurance plan to try to find a therapist in the local area.      Relevant Orders   Ambulatory referral to [place]   Other Visit Diagnoses     Palpitations    -  Primary   Greater trochanteric bursitis of right hip       Left hip pain       Relevant Orders   Ambulatory referral to Physical Therapy   Acute pain of left knee       Relevant Orders   Ambulatory referral to Physical Therapy      Right lateral hip pain-most consistent with trochanteric bursitis though she also could have lumbar radiculopathy which is certainly possible.  But we will start with the bursa exercises and see if it improves.  No follow-ups on file.   I spent 45 minutes on the day of the encounter to include pre-visit record review, face-to-face time with the patient and post visit ordering of test.   Beatrice Lecher, MD

## 2022-04-21 NOTE — Telephone Encounter (Signed)
Transition Care Management Follow-up Telephone Call Date of discharge and from where: 09/07/023 from [facility] How have you been since you were released from the hospital? Patient has an OV today with PCP.  Any questions or concerns? No

## 2022-04-21 NOTE — Telephone Encounter (Signed)
Transition Care Management Unsuccessful Follow-up Telephone Call  Date of discharge and from where:  04/13/22 from [facility]  Attempts:  3rd Attempt  Reason for unsuccessful TCM follow-up call:  No answer/busy Follow up scheduled for today.

## 2022-04-21 NOTE — Assessment & Plan Note (Signed)
Uncontrolled here but she is can work on increasing her metoprolol that should help some.

## 2022-04-22 DIAGNOSIS — I491 Atrial premature depolarization: Secondary | ICD-10-CM | POA: Diagnosis not present

## 2022-04-24 ENCOUNTER — Encounter (HOSPITAL_BASED_OUTPATIENT_CLINIC_OR_DEPARTMENT_OTHER): Payer: Self-pay | Admitting: Emergency Medicine

## 2022-04-24 ENCOUNTER — Telehealth: Payer: Self-pay | Admitting: General Practice

## 2022-04-24 ENCOUNTER — Emergency Department (HOSPITAL_BASED_OUTPATIENT_CLINIC_OR_DEPARTMENT_OTHER)
Admission: EM | Admit: 2022-04-24 | Discharge: 2022-04-25 | Disposition: A | Discharge status: Home / Self Care | Payer: Medicare Other | Attending: Emergency Medicine | Admitting: Emergency Medicine

## 2022-04-24 ENCOUNTER — Other Ambulatory Visit: Payer: Self-pay

## 2022-04-24 DIAGNOSIS — Z20822 Contact with and (suspected) exposure to covid-19: Secondary | ICD-10-CM | POA: Diagnosis not present

## 2022-04-24 DIAGNOSIS — J45909 Unspecified asthma, uncomplicated: Secondary | ICD-10-CM | POA: Diagnosis not present

## 2022-04-24 DIAGNOSIS — R0602 Shortness of breath: Secondary | ICD-10-CM

## 2022-04-24 DIAGNOSIS — Z79899 Other long term (current) drug therapy: Secondary | ICD-10-CM | POA: Diagnosis not present

## 2022-04-24 DIAGNOSIS — Z7951 Long term (current) use of inhaled steroids: Secondary | ICD-10-CM | POA: Diagnosis not present

## 2022-04-24 DIAGNOSIS — I10 Essential (primary) hypertension: Secondary | ICD-10-CM | POA: Diagnosis not present

## 2022-04-24 DIAGNOSIS — Z8541 Personal history of malignant neoplasm of cervix uteri: Secondary | ICD-10-CM | POA: Diagnosis not present

## 2022-04-24 LAB — CBC
HCT: 39 % (ref 36.0–46.0)
Hemoglobin: 13 g/dL (ref 12.0–15.0)
MCH: 29.5 pg (ref 26.0–34.0)
MCHC: 33.3 g/dL (ref 30.0–36.0)
MCV: 88.6 fL (ref 80.0–100.0)
Platelets: 194 10*3/uL (ref 150–400)
RBC: 4.4 MIL/uL (ref 3.87–5.11)
RDW: 13.8 % (ref 11.5–15.5)
WBC: 6 10*3/uL (ref 4.0–10.5)
nRBC: 0 % (ref 0.0–0.2)

## 2022-04-24 LAB — BASIC METABOLIC PANEL
Anion gap: 6 (ref 5–15)
BUN: 13 mg/dL (ref 6–20)
CO2: 23 mmol/L (ref 22–32)
Calcium: 8.4 mg/dL — ABNORMAL LOW (ref 8.9–10.3)
Chloride: 111 mmol/L (ref 98–111)
Creatinine, Ser: 0.66 mg/dL (ref 0.44–1.00)
GFR, Estimated: >60 mL/min (ref >60)
Glucose, Bld: 156 mg/dL — ABNORMAL HIGH (ref 70–99)
Potassium: 3.5 mmol/L (ref 3.5–5.1)
Sodium: 140 mmol/L (ref 135–145)

## 2022-04-24 LAB — TROPONIN I (HIGH SENSITIVITY): Troponin I (High Sensitivity): 4 ng/L (ref <18)

## 2022-04-24 NOTE — Telephone Encounter (Signed)
Transition Care Management Follow-up Telephone Call Date of discharge and from where: 04/17/22 from [place] How have you been since you were released from the hospital? Patient had OV on 04/21/22 with PCP. Any questions or concerns? No

## 2022-04-24 NOTE — ED Notes (Signed)
X2 unsuccessful venipunctures on pt. Pt reports she needs [place] stick or USIV. Provider states okay not to draw lab work at this time.

## 2022-04-24 NOTE — Telephone Encounter (Signed)
Transition Care Management Unsuccessful Follow-up Telephone Call  Date of discharge and from where:  04/21/22 from [facility]  Attempts:  1st Attempt  Reason for unsuccessful TCM follow-up call:  No answer/busy

## 2022-04-24 NOTE — ED Triage Notes (Signed)
Pt c/o palpations, shob, cough, and "pain in trachea". Reports these are ongoing issues since having an endoscopy.

## 2022-04-25 ENCOUNTER — Telehealth (INDEPENDENT_AMBULATORY_CARE_PROVIDER_SITE_OTHER): Payer: Medicare Other | Admitting: Family Medicine

## 2022-04-25 ENCOUNTER — Encounter: Payer: Self-pay | Admitting: Family Medicine

## 2022-04-25 DIAGNOSIS — M5442 Lumbago with sciatica, left side: Secondary | ICD-10-CM

## 2022-04-25 DIAGNOSIS — M5441 Lumbago with sciatica, right side: Secondary | ICD-10-CM

## 2022-04-25 LAB — SARS CORONAVIRUS 2 BY RT PCR: SARS Coronavirus 2 by RT PCR: NEGATIVE

## 2022-04-25 IMAGING — DX DG CHEST 1V PORT
1 series · 1 of 1 positions shown · non-contrast
Comparison: 01/23/2021

CLINICAL DATA: Shortness of breath and chest pain for 2 days,
initial encounter

EXAM:
PORTABLE CHEST 1 VIEW

[chest ap]
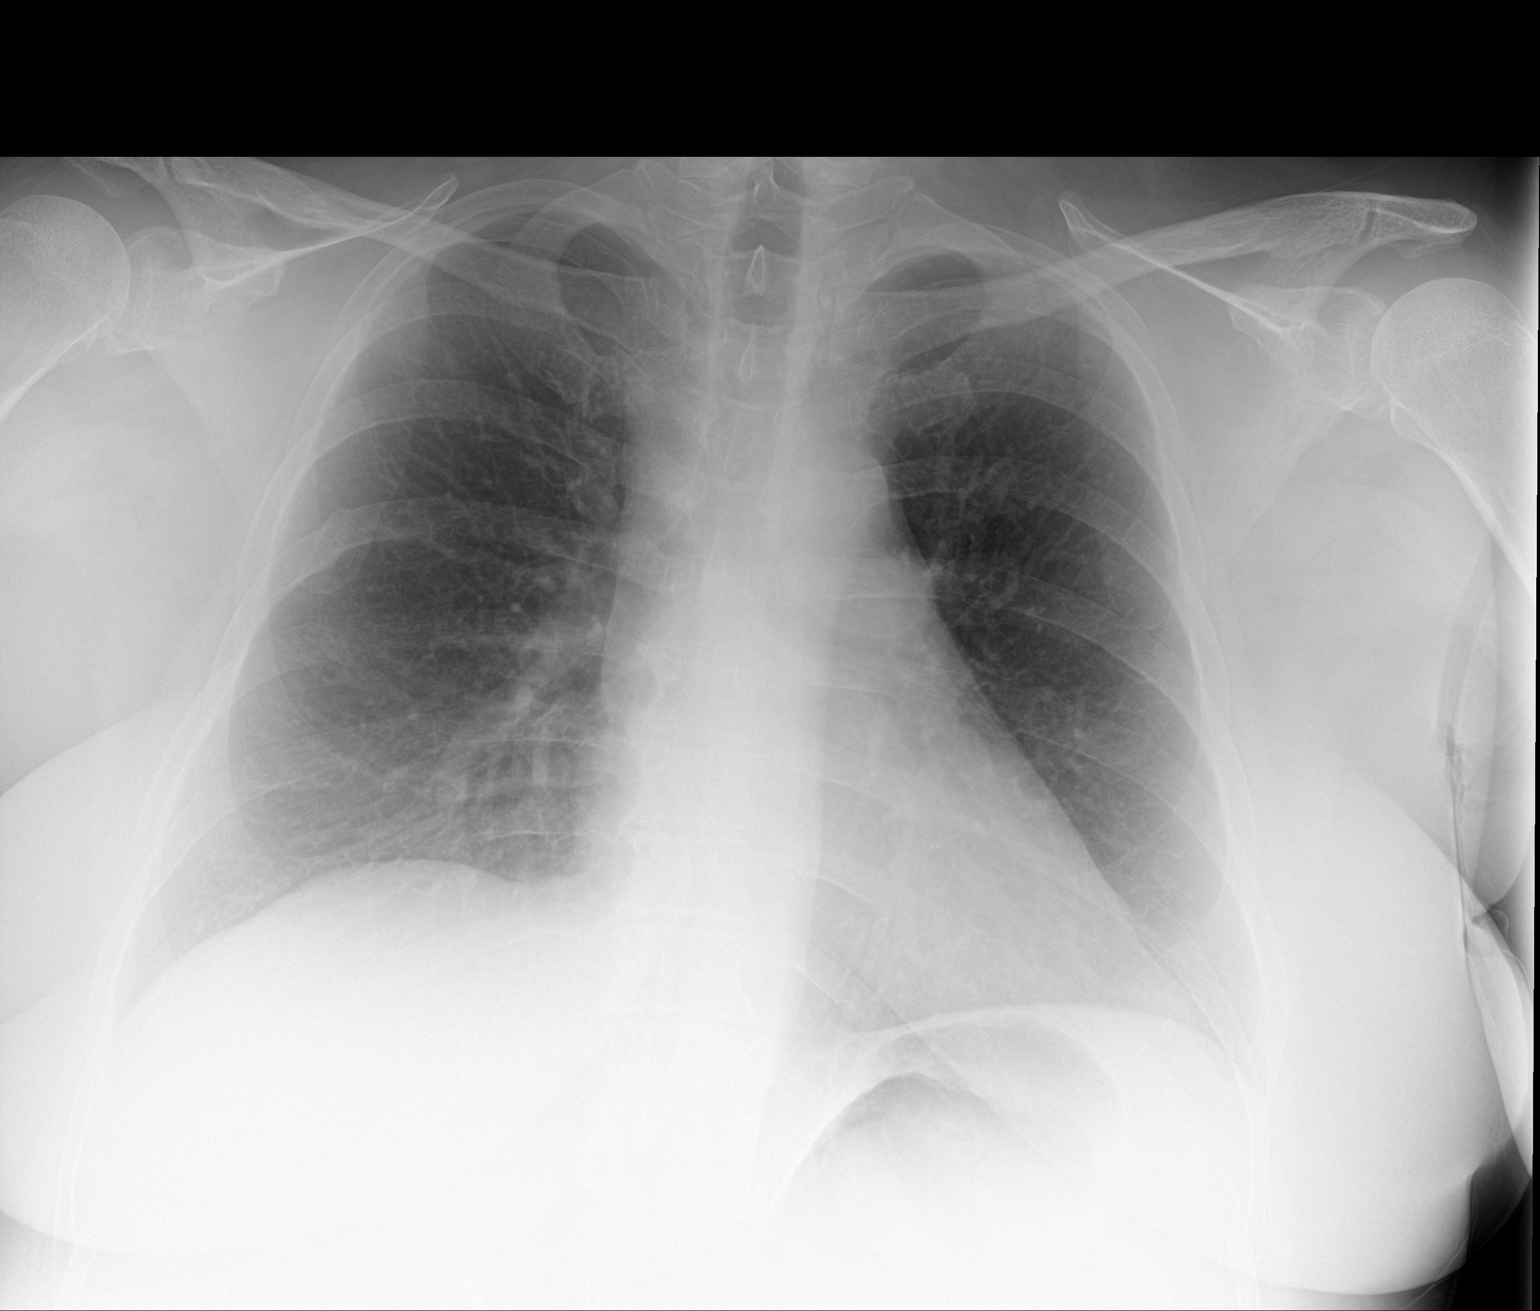

[1 of 1 positions shown; findings below may reference images not displayed]

FINDINGS: The heart size and mediastinal contours are within normal limits.
Both lungs are clear. The visualized skeletal structures are
unremarkable.
IMPRESSION: No active disease.

## 2022-04-25 MED ORDER — LIDOCAINE 5 % EX PTCH: 1.0000 | MEDICATED_PATCH | CUTANEOUS | 0 refills | Status: DC
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
## 2022-04-25 NOTE — Progress Notes (Unsigned)
    Virtual Visit via Video Note  I connected with KANAE [name] on 04/25/22 at  1:00 PM EDT by a video enabled telemedicine application and verified that I am speaking with the correct person using two identifiers.   I discussed the limitations of evaluation and management by telemedicine and the availability of in person appointments. The patient expressed understanding and agreed to proceed.  Patient location: at home Provider location: in office  Subjective:    CC:   Chief Complaint  Patient presents with   Back Pain    Lower back pain and and shoots down legs and and states been going for three days    HPI: Pain in started about 3 days ago.  Having back pain that radiates into her outer hip. Using IBU, tylenol.  She was giving her cat a hair cut but o/w no specific injury.  Pain started suddenly.  Hard to walk.  Hard to stand up straight.  Hot shower.   01/12/21 Xray: DDD, mild , worse at L5-S1.   MRI 2016: DD at L5-S1  They called for [facility] therapy yesterday to get her schedule.  She is worried she may not be able to make payments.  She would like to see the same person.   She has been sleeping on her cough.    Past medical history, Surgical history, Family history not pertinant except as noted below, Social history, Allergies, and medications have been entered into the medical record, reviewed, and corrections made.    Objective:    General: Speaking clearly in complete sentences without any shortness of breath.  Alert and oriented x3.  Normal judgment. No apparent acute distress.    Impression and Recommendations:    Problem List Items Addressed This Visit   None Visit Diagnoses     Acute bilateral low back pain with bilateral sciatica    -  Primary       No orders of the defined types were placed in this encounter.   Meds ordered this encounter  Medications   lidocaine (LIDODERM) 5 %    Sig: Place 1 patch onto the skin daily.  Remove & Discard patch within 12 hours or as directed by MD    Dispense:  30 patch    Refill:  0    Doesn't tolerate pain medications.     I discussed the assessment and treatment plan with the patient. The patient was provided an opportunity to ask questions and all were answered. The patient agreed with the plan and demonstrated an understanding of the instructions.   The patient was advised to call back or seek an in-person evaluation if the symptoms worsen or if the condition fails to improve as anticipated.   Jennifer Lecher, MD

## 2022-04-26 ENCOUNTER — Ambulatory Visit: Payer: Medicare Other | Admitting: Allergy

## 2022-04-26 ENCOUNTER — Telehealth: Payer: Self-pay | Admitting: Family Medicine

## 2022-04-26 ENCOUNTER — Ambulatory Visit (INDEPENDENT_AMBULATORY_CARE_PROVIDER_SITE_OTHER): Payer: Medicare Other | Admitting: Allergy

## 2022-04-26 ENCOUNTER — Encounter: Payer: Self-pay | Admitting: Allergy

## 2022-04-26 ENCOUNTER — Telehealth: Payer: Self-pay | Admitting: *Deleted

## 2022-04-26 VITALS — BP 180/90 | HR 104 | Temp 98.0°F | Resp 20 | Ht 61.61 in | Wt 240.3 lb

## 2022-04-26 DIAGNOSIS — R030 Elevated blood-pressure reading, without diagnosis of hypertension: Secondary | ICD-10-CM | POA: Diagnosis not present

## 2022-04-26 DIAGNOSIS — R079 Chest pain, unspecified: Secondary | ICD-10-CM | POA: Diagnosis not present

## 2022-04-26 DIAGNOSIS — K219 Gastro-esophageal reflux disease without esophagitis: Secondary | ICD-10-CM | POA: Diagnosis not present

## 2022-04-26 DIAGNOSIS — J4540 Moderate persistent asthma, uncomplicated: Secondary | ICD-10-CM

## 2022-04-26 DIAGNOSIS — Z743 Need for continuous supervision: Secondary | ICD-10-CM | POA: Diagnosis not present

## 2022-04-26 DIAGNOSIS — R6889 Other general symptoms and signs: Secondary | ICD-10-CM | POA: Diagnosis not present

## 2022-04-26 DIAGNOSIS — R053 Chronic cough: Secondary | ICD-10-CM | POA: Insufficient documentation

## 2022-04-26 DIAGNOSIS — I499 Cardiac arrhythmia, unspecified: Secondary | ICD-10-CM | POA: Diagnosis not present

## 2022-04-26 DIAGNOSIS — J310 Chronic rhinitis: Secondary | ICD-10-CM | POA: Diagnosis not present

## 2022-04-26 DIAGNOSIS — J383 Other diseases of vocal cords: Secondary | ICD-10-CM

## 2022-04-26 DIAGNOSIS — R404 Transient alteration of awareness: Secondary | ICD-10-CM | POA: Diagnosis not present

## 2022-04-26 DIAGNOSIS — R0602 Shortness of breath: Secondary | ICD-10-CM | POA: Diagnosis not present

## 2022-04-26 MED ORDER — FLUTICASONE PROPIONATE HFA 110 MCG/ACT IN AERO: 1.0000 | INHALATION_SPRAY | Freq: Two times a day (BID) | RESPIRATORY_TRACT | 1 refills | Status: DC

## 2022-04-26 NOTE — Progress Notes (Signed)
Follow Up Note  RE: Jennifer [name] MRN: [id number] DOB: November 13, 1965 Date of Office Visit: 04/26/2022  Referring provider: [name], * Primary care provider: [name], MD  Chief Complaint: Cough (She will cough so hard, then she can't breathe and if she will cough up thick phlegm)  History of Present Illness: I had the pleasure of seeing Jennifer [name] for a follow up visit at the [facility] on 04/26/2022. She is a 56 y.o. female, who is being followed for asthma, reflux, chronic rhinitis. Her previous allergy office visit was on 04/10/2022 with Dr. Simona Huh. Today is a new complaint visit of coughing  and update on her symptoms.   Asthma Patient saw pulmonology in September. She is scheduled to have a sleep study.  She is still having issues with coughing. Worse in the morning and at night. She also notices it after eating. Sometimes has thick, clear mucous with it and feels better once she gets the phlegm out.   Taking guaifenesin 2-3 times per day which seems to be helping.   Patient has issues with cardiac arrhythmias and hesitant about taking any new meds.  She also has issues with high blood pressure and she has tried multiple different meds but they seem to make it worse.   She is also following with ENT and scheduled to have a swallow evaluation. Sometimes she feels like her throat gets stuck in her throat. Apparently she had EGD done as well in the past. She is taking famotidine for this. She did not tolerate PPIs in the past.   Patient is also having dyspnea on exertion and has gained weight. She is not able to do much exertion anymore as it makes her heart race and breathing acts up. This seemed to get worse after she had Covid-19.  She took Firth for 1 week but then stopped as she developed perioral pruritus and white spots on her tongue. She thinks it helped and willing to try again. She states that Flovent was not covered and  can't afford if it's more than $20+  She also wants to know if she can get skin tested again as she feels like she may have developed new allergies.    04/18/2022 pulmonology visit: "Plan  1. Asthma, unspecified asthma severity, unspecified whether complicated, unspecified whether persistent (Primary) 2. Acute cough 3. Obstructive Sleep Apnea  Concern that her "lungs are inflated". Chest Xray today is normal. PFTs 03/2022 normal at outside facility. Lungs CTA on physical exam.  Long discussion today about asthma, goals of therapy, and treating upper airway cough syndrome to help asthma symptoms.  Will start Flonase daily. Use nasal saline spray. Consider second generation antihistamine.  Will order pulmonary rehab in [place].   Will order a repeat sleep study in [place].   Discussed Spiriva repsimat. Patient is reluctant given concerns of potential side effects with a hx of multiple drug allergies.   RTC In 3 months for ongoing evaluation  Maintain f/u with GI and ENT"  Assessment and Plan: Jennifer [name] is a 56 y.o. female with: Chronic cough Patient presents today with multiple complaints but her main concern is the persistent cough. Patient is following with pulmonology, cardiology, GI and ENT. She does have dysphagia, GERD, vocal cord spasms and cardiac arrhythmias.  Today's spirometry showed some restriction - similar to last one. Discussed with patient that coughing can have a long list of differentials and she also has other medical issues that may  be contributing to it.  Recommend that she follows with GI, ENT, cardiology and pulmonology. She is hesitant about starting any new inhaler but is willing to try ICS inhaler - will start low dose ICS. Daily controller medication(s): Flovent 130mg 1 puff once a day and rinse mouth after each use (given her issues with perioral dermatitis with Arnuity) If this is not covered then use Arnuity 1024m 1 puff once a day and wash  face/rinse mouth after each use.   May use levoalbuterol rescue inhaler 2 puffs or nebulizer every 4 to 6 hours as needed for shortness of breath, chest tightness, coughing, and wheezing. Monitor frequency of use.   Chronic rhinitis Continue flunisolide nasal spray 2 sprays in each nostril once a day a day as needed for stuffy nose.  In the right nostril, point the applicator out toward the right ear. In the left nostril, point the applicator out toward the left ear Continue to perform nasal saline rinses 1-3 times a day as needed to help clean sinus tract - neil med sinus rinses DO NOT USE TAP WATER, can use distilled water or tap water that you boil for several minutes and then let cool prior to use Can use salt water gargles to help with phlegm in throat  Gastroesophageal reflux disease without esophagitis Continue dietary lifestyle modifications. Continue the regimen as prescribed by your gastroenterologist.   Elevated blood pressure reading Monitor symptoms and follow up with PCP.   Return in about 2 months (around 06/26/2022).  Meds ordered this encounter  Medications   fluticasone (FLOVENT HFA) 110 MCG/ACT inhaler    Sig: Inhale 1-2 puffs into the lungs in the morning and at bedtime. rinse mouth afterwards.    Dispense:  1 each    Refill:  1   Lab Orders  No laboratory test(s) ordered today    Diagnostics: Spirometry:  Tracings reviewed. Her effort: Good reproducible efforts. FVC: 2.17L FEV1: 1.89L, 78% predicted FEV1/FVC ratio: 87% Interpretation: Spirometry consistent with possible restrictive disease.  Please see scanned spirometry results for details.  Medication List:  Current Outpatient Medications  Medication Sig Dispense Refill   ACCU-CHEK GUIDE test strip For testing blood sugars dailyDx:r73.03 100 each 11   bisacodyl (DULCOLAX) 5 MG EC tablet Take by mouth.     chlorhexidine (PERIDEX) 0.12 % solution SMARTSIG:15 Milliliter(s) By Mouth Morning-Night      clindamycin (CLEOCIN) 75 MG capsule Take 75 mg by mouth 3 (three) times daily.     EPINEPHrine 0.3 mg/0.3 mL IJ SOAJ injection Use as directed for severe allergic reactions 2 each 1   famotidine (PEPCID) 20 MG tablet Take by mouth.     flunisolide (NASALIDE) 25 MCG/ACT (0.025%) SOLN Place 2 sprays into the nose daily as needed (for stuffy nose). 25 mL 5   fluticasone (FLOVENT HFA) 110 MCG/ACT inhaler Inhale 1-2 puffs into the lungs in the morning and at bedtime. rinse mouth afterwards. 1 each 1   guaiFENesin (ROBITUSSIN) 100 MG/5ML liquid Take 5 mLs by mouth every 4 (four) hours as needed for cough or to loosen phlegm.     levalbuterol (XOPENEX HFA) 45 MCG/ACT inhaler INHALE 2 PUFFS INTO THE LUNGS EVERY 6 HOURS AS NEEDED FOR WHEEZING 45 g 1   metoprolol tartrate (LOPRESSOR) 50 MG tablet Take 1 tablet (50 mg total) by mouth 2 (two) times daily. 180 tablet 1   potassium chloride (KLOR-CON) 20 MEQ packet Take 20 mEq by mouth as needed.     spironolactone (ALDACTONE) 25  MG tablet Take 25 mg by mouth as needed.     No current facility-administered medications for this visit.   Allergies: Allergies  Allergen Reactions   Azithromycin Shortness Of Breath    Lip swelling, SOB.      Ciprofloxacin Swelling    REACTION: tongue swells   Codeine Shortness Of Breath   Erythromycin Base Itching and Rash   Sulfa Antibiotics Shortness Of Breath, Rash and Other (See Comments)   Sulfasalazine Rash and Shortness Of Breath    Other reaction(s): Other (See Comments) Other reaction(s): SHORTNESS OF BREATH   Telmisartan Swelling    Tongue swelling, Micardis   Ace Inhibitors Cough   Aspirin Hives and Other (See Comments)    flushing   Atenolol Other (See Comments)    Squeezing chest sensation   Avelox [Moxifloxacin Hcl In Nacl] Itching        Beta Adrenergic Blockers Other (See Comments)    Feels like chest tightening labetalol, bystolic  Feels like chest tightening "Metoprolol"    Buspar [Buspirone]  Other (See Comments)    Light headed   Butorphanol Tartrate Other (See Comments)    Patient aggitated   Cetirizine Hives and Rash        Clonidine Hcl     REACTION: makes blood pressure high   Cortisone     Feels like she is going crazy   Erythromycin Rash   Fentanyl Other (See Comments)    aggressive    Fluoxetine Hcl Other (See Comments)    REACTION: headaches   Ketorolac Tromethamine     jittery   Lidocaine Other (See Comments)    When it involves the throat,    Lisinopril Cough   Metoclopramide Hcl Other (See Comments)    Dystonic reaction   Midazolam Other (See Comments)    agitation Slow to wake up   Montelukast Other (See Comments)    Singulair   Montelukast Sodium Other (See Comments)    DOES NOT REMEMBER  Don't remember-told not to take   Naproxen Other (See Comments)    FLUSHING Pt states she took Ibuprofen today (10/08/19)   Paroxetine Other (See Comments)    REACTION: headaches   Penicillins Rash   Pravastatin Other (See Comments)    Myalgias   Promethazine Other (See Comments)    Dystonic reaction   Promethazine Hcl Other (See Comments)    jittery   Quinolones Swelling and Rash   Serotonin Reuptake Inhibitors (Ssris) Other (See Comments)    Headache Effexor, prozac, zoloft,    Sertraline Hcl     REACTION: headaches   Stelazine [Trifluoperazine] Other (See Comments)    Dystonic reaction   Tobramycin Itching and Rash   Trifluoperazine Hcl     dystonic   Atrovent Nasal Spray [Ipratropium]     Tachycardia and shaking   Diltiazem Other (See Comments)    Chest pain   Iodinated Contrast Media     Other reaction(s): Other (See Comments) Chest heaviness/sob   Polyethylene Glycol 3350     Other reaction(s): Laryngeal Edema (ALLERGY)   Propoxyphene    Adhesive [Tape] Rash    EKG monitor patches, some tapes Blisters, rash, itching, welts.   Butorphanol Anxiety    Patient agitated   Ceftriaxone Rash    rocephin   Iron Rash    Flushing with  certain IV types   Metoclopramide Itching and Other (See Comments)    Dystonic reaction   Metronidazole Rash   Other Rash and Other (See Comments)  Uncoded Allergy. Allergen: steriods, Other Reaction: Not Assessed Other reaction(s): Flushing (ALLERGY/intolerance), GI Upset (intolerance), Hypertension (intolerance), Increased Heart Rate (intolerance), Mental Status Changes (intolerance), Other (See Comments), Tachycardia / Palpitations  (intolerance) Hospital gowns leave a rash.    Prednisone Anxiety and Palpitations   Prochlorperazine Anxiety    Compazine:  Dystonic reaction   Venlafaxine Anxiety   Zyrtec [Cetirizine Hcl] Rash    All over body   I reviewed her past medical history, social history, family history, and environmental history and no significant changes have been reported from her previous visit.  Review of Systems  Constitutional:  Positive for fatigue and unexpected weight change (weight gain). Negative for appetite change, chills and fever.  HENT:  Positive for trouble swallowing. Negative for congestion and rhinorrhea.   Eyes:  Negative for itching.  Respiratory:  Positive for cough and shortness of breath. Negative for chest tightness and wheezing.   Cardiovascular:  Positive for palpitations.  Gastrointestinal:  Negative for abdominal pain.  Skin:  Negative for rash.  Neurological:  Negative for headaches.    Objective: BP (!) 180/90 (BP Location: Left Arm, Patient Position: Sitting, Cuff Size: Large)   Pulse (!) 104   Temp 98 F (36.7 C) (Temporal)   Resp 20   Ht 5' 1.61" (1.565 m)   Wt 240 lb 4.8 oz (109 kg)   LMP 06/25/2013   SpO2 98%   BMI 44.50 kg/m  Body mass index is 44.5 kg/m. Physical Exam Vitals and nursing note reviewed.  Constitutional:      Appearance: Normal appearance. She is well-developed.  HENT:     Head: Normocephalic and atraumatic.     Right Ear: Tympanic membrane and external ear normal.     Left Ear: Tympanic membrane and  external ear normal.     Nose: Nose normal.     Mouth/Throat:     Mouth: Mucous membranes are moist.     Pharynx: Oropharynx is clear.  Eyes:     Conjunctiva/sclera: Conjunctivae normal.  Cardiovascular:     Rate and Rhythm: Normal rate and regular rhythm.     Heart sounds: Normal heart sounds. No murmur heard. Pulmonary:     Effort: Pulmonary effort is normal.     Breath sounds: Normal breath sounds. No wheezing, rhonchi or rales.  Musculoskeletal:     Cervical back: Neck supple.  Skin:    General: Skin is warm.     Findings: No rash.  Neurological:     Mental Status: She is alert and oriented to person, place, and time.  Psychiatric:        Behavior: Behavior normal.    Previous notes and tests were reviewed. The plan was reviewed with the patient/family, and all questions/concerned were addressed.  It was my pleasure to see Jennifer [name] today and participate in her care. Please feel free to contact me with any questions or concerns.  Sincerely,  Rexene Alberts, DO Allergy & Immunology  [facility] office: [place] office: [phone]

## 2022-04-26 NOTE — Assessment & Plan Note (Signed)
.   Continue dietary lifestyle modifications. . Continue the regimen as prescribed by your gastroenterologist.

## 2022-04-26 NOTE — Telephone Encounter (Signed)
Transition Care Management Unsuccessful Follow-up Telephone Call  Date of discharge and from where:  04/21/22 from [facility]  Attempts:  2nd Attempt  Reason for unsuccessful TCM follow-up call:  No answer/busy

## 2022-04-26 NOTE — Assessment & Plan Note (Signed)
.   Continue flunisolide nasal spray 2 sprays in each nostril once a day a day as needed for stuffy nose.  In the right nostril, point the applicator out toward the right ear. In the left nostril, point the applicator out toward the left ear . Continue to perform nasal saline rinses 1-3 times a day as needed to help clean sinus tract - neil med sinus rinses DO NOT USE TAP WATER, can use distilled water or tap water that you boil for several minutes and then let cool prior to use . Can use salt water gargles to help with phlegm in throat

## 2022-04-26 NOTE — Assessment & Plan Note (Addendum)
Patient presents today with multiple complaints but her main concern is the persistent cough. Patient is following with pulmonology, cardiology, GI and ENT. She does have dysphagia, GERD, vocal cord spasms and cardiac arrhythmias.   Today's spirometry showed some restriction - similar to last one. . Discussed with patient that coughing can have a long list of differentials and she also has other medical issues that may be contributing to it.  . Recommend that she follows with GI, ENT, cardiology and pulmonology. . She is hesitant about starting any new inhaler but is willing to try ICS inhaler - will start low dose ICS. Marland Kitchen Daily controller medication(s): Flovent 160mg 1 puff once a day and rinse mouth after each use (given her issues with perioral dermatitis with Arnuity) o If this is not covered then use Arnuity 1062m 1 puff once a day and wash face/rinse mouth after each use.   . May use levoalbuterol rescue inhaler 2 puffs or nebulizer every 4 to 6 hours as needed for shortness of breath, chest tightness, coughing, and wheezing. Monitor frequency of use.

## 2022-04-26 NOTE — Telephone Encounter (Signed)
When she goes for her initial appointment we can add on for the back.

## 2022-04-26 NOTE — Telephone Encounter (Signed)
Pt states you were going to place a referral to Physical Therapy at [facility] and she would like to make sure it's for her knees, back and Hips.

## 2022-04-26 NOTE — Patient Instructions (Addendum)
Coughing:  Daily controller medication(s): Flovent 123mg 1 puff once a day and rinse mouth each use.  If this is not covered then use Arnuity 1037m 1 puff once a day and rinse mouth after each use.   May use levoalbuterol rescue inhaler 2 puffs or nebulizer every 4 to 6 hours as needed for shortness of breath, chest tightness, coughing, and wheezing. Monitor frequency of use.  Breathing control goals:  Full participation in all desired activities (may need albuterol before activity) Albuterol use two times or less a week on average (not counting use with activity) Cough interfering with sleep two times or less a month Oral steroids no more than once a year No hospitalizations   Reflux Continue dietary lifestyle modifications. Continue the regimen as prescribed by your gastroenterologist.   Chronic rhinitis Continue flunisolide nasal spray 2 sprays in each nostril once a day a day as needed for stuffy nose.  In the right nostril, point the applicator out toward the right ear. In the left nostril, point the applicator out toward the left ear Continue to perform nasal saline rinses 1-3 times a day as needed to help clean sinus tract - neil med sinus rinses DO NOT USE TAP WATER, can use distilled water or tap water that you boil for several minutes and then let cool prior to use Can use salt water gargles to help with phlegm in throat  Muscle Tension Dysphonia and suspected vocal cord spasm  - these symptoms can worsen if drainage and reflux are uncontrolled - important to keep these symptoms at baHuson continue to follow up with ENT and the swallowing evaluation.   High blood pressure Monitor symptoms and follow up with PCP.   Follow up in 2 months or sooner if needed.   After September 2023, [facility]. You can continue your care and follow up with Dr. DeSimona HuhDr. LoEdison Pacer the nurse practitioners ([facility] or [facility]

## 2022-04-26 NOTE — Assessment & Plan Note (Signed)
.   Monitor symptoms and follow up with PCP.

## 2022-04-26 NOTE — Telephone Encounter (Signed)
Spoke with the patient regarding the referral to GYN oncology. Patient scheduled as new patient w Dels[facility] Sale on 05/10/22 at 10:30am. Patient given an arrival time of 10:00am.  Explained to the patient the the doctor will perform a pelvic exam at this visit. Patient given the policy that no visitors under the 16 yrs are allowed in the [facility]. Patient given the address/phone number for the clinic and that the center offers free valet service.

## 2022-04-27 DIAGNOSIS — R079 Chest pain, unspecified: Secondary | ICD-10-CM | POA: Diagnosis not present

## 2022-04-28 ENCOUNTER — Ambulatory Visit (INDEPENDENT_AMBULATORY_CARE_PROVIDER_SITE_OTHER): Payer: Medicare Other | Admitting: Clinical

## 2022-04-28 DIAGNOSIS — F419 Anxiety disorder, unspecified: Secondary | ICD-10-CM | POA: Diagnosis not present

## 2022-04-28 DIAGNOSIS — F332 Major depressive disorder, recurrent severe without psychotic features: Secondary | ICD-10-CM | POA: Diagnosis not present

## 2022-04-28 DIAGNOSIS — F4310 Post-traumatic stress disorder, unspecified: Secondary | ICD-10-CM | POA: Diagnosis not present

## 2022-04-28 NOTE — Progress Notes (Signed)
                Vera Wishart, LCSW 
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
## 2022-04-28 NOTE — Telephone Encounter (Signed)
Transition Care Management Unsuccessful Follow-up Telephone Call  Date of discharge and from where:  04/21/22 from [facility]  Attempts:  3rd Attempt  Reason for unsuccessful TCM follow-up call:  No answer/busy

## 2022-04-29 DIAGNOSIS — R0602 Shortness of breath: Secondary | ICD-10-CM | POA: Diagnosis not present

## 2022-04-29 DIAGNOSIS — Z87891 Personal history of nicotine dependence: Secondary | ICD-10-CM | POA: Diagnosis not present

## 2022-04-29 DIAGNOSIS — R000 Tachycardia, unspecified: Secondary | ICD-10-CM | POA: Diagnosis not present

## 2022-04-29 DIAGNOSIS — I491 Atrial premature depolarization: Secondary | ICD-10-CM | POA: Diagnosis not present

## 2022-04-29 DIAGNOSIS — Z20822 Contact with and (suspected) exposure to covid-19: Secondary | ICD-10-CM | POA: Diagnosis not present

## 2022-04-29 DIAGNOSIS — R059 Cough, unspecified: Secondary | ICD-10-CM | POA: Diagnosis not present

## 2022-04-29 DIAGNOSIS — I517 Cardiomegaly: Secondary | ICD-10-CM | POA: Diagnosis not present

## 2022-05-01 ENCOUNTER — Other Ambulatory Visit: Payer: Self-pay

## 2022-05-01 DIAGNOSIS — I491 Atrial premature depolarization: Secondary | ICD-10-CM | POA: Diagnosis not present

## 2022-05-01 MED ORDER — DIAZEPAM 2 MG PO TABS: 2.0000 mg | ORAL_TABLET | Freq: Every day | ORAL | 0 refills | Status: DC | PRN

## 2022-05-01 NOTE — Telephone Encounter (Signed)
Jennifer [name] called and would like a refill on diazepam 2 mg. She states she has had the same prescription for a year and has not used any. Now they will expire this month. She states she likes to keep them on hand just in case.

## 2022-05-02 DIAGNOSIS — R9431 Abnormal electrocardiogram [ECG] [EKG]: Secondary | ICD-10-CM | POA: Diagnosis not present

## 2022-05-02 DIAGNOSIS — I499 Cardiac arrhythmia, unspecified: Secondary | ICD-10-CM | POA: Diagnosis not present

## 2022-05-02 DIAGNOSIS — I119 Hypertensive heart disease without heart failure: Secondary | ICD-10-CM | POA: Diagnosis not present

## 2022-05-02 DIAGNOSIS — R072 Precordial pain: Secondary | ICD-10-CM | POA: Diagnosis not present

## 2022-05-02 DIAGNOSIS — R002 Palpitations: Secondary | ICD-10-CM | POA: Diagnosis not present

## 2022-05-02 DIAGNOSIS — Z87891 Personal history of nicotine dependence: Secondary | ICD-10-CM | POA: Diagnosis not present

## 2022-05-02 DIAGNOSIS — R0602 Shortness of breath: Secondary | ICD-10-CM | POA: Diagnosis not present

## 2022-05-02 DIAGNOSIS — I10 Essential (primary) hypertension: Secondary | ICD-10-CM | POA: Diagnosis not present

## 2022-05-02 DIAGNOSIS — I517 Cardiomegaly: Secondary | ICD-10-CM | POA: Diagnosis not present

## 2022-05-03 ENCOUNTER — Ambulatory Visit: Payer: Medicare Other | Attending: Family Medicine | Admitting: Physical Therapy

## 2022-05-03 ENCOUNTER — Emergency Department (HOSPITAL_BASED_OUTPATIENT_CLINIC_OR_DEPARTMENT_OTHER)
Admission: EM | Admit: 2022-05-03 | Discharge: 2022-05-04 | Disposition: A | Discharge status: Home / Self Care | Payer: Medicare Other | Attending: Emergency Medicine | Admitting: Emergency Medicine

## 2022-05-03 ENCOUNTER — Other Ambulatory Visit: Payer: Self-pay

## 2022-05-03 ENCOUNTER — Encounter (HOSPITAL_BASED_OUTPATIENT_CLINIC_OR_DEPARTMENT_OTHER): Payer: Self-pay | Admitting: Emergency Medicine

## 2022-05-03 DIAGNOSIS — I10 Essential (primary) hypertension: Secondary | ICD-10-CM | POA: Diagnosis not present

## 2022-05-03 DIAGNOSIS — R1084 Generalized abdominal pain: Secondary | ICD-10-CM | POA: Insufficient documentation

## 2022-05-03 DIAGNOSIS — I493 Ventricular premature depolarization: Secondary | ICD-10-CM | POA: Diagnosis not present

## 2022-05-03 DIAGNOSIS — Z7951 Long term (current) use of inhaled steroids: Secondary | ICD-10-CM | POA: Insufficient documentation

## 2022-05-03 DIAGNOSIS — R002 Palpitations: Secondary | ICD-10-CM | POA: Diagnosis not present

## 2022-05-03 DIAGNOSIS — J45909 Unspecified asthma, uncomplicated: Secondary | ICD-10-CM | POA: Diagnosis not present

## 2022-05-03 DIAGNOSIS — R109 Unspecified abdominal pain: Secondary | ICD-10-CM | POA: Diagnosis present

## 2022-05-03 LAB — URINALYSIS, ROUTINE W REFLEX MICROSCOPIC
Bilirubin Urine: NEGATIVE
Glucose, UA: NEGATIVE mg/dL
Hgb urine dipstick: NEGATIVE
Ketones, ur: NEGATIVE mg/dL
Leukocytes,Ua: NEGATIVE
Nitrite: NEGATIVE
Protein, ur: NEGATIVE mg/dL
Specific Gravity, Urine: >=1.03 (ref 1.005–1.030)
pH: 5.5 (ref 5.0–8.0)

## 2022-05-03 LAB — COMPREHENSIVE METABOLIC PANEL
ALT: 27 U/L (ref 0–44)
AST: 18 U/L (ref 15–41)
Albumin: 3.5 g/dL (ref 3.5–5.0)
Alkaline Phosphatase: 69 U/L (ref 38–126)
Anion gap: 7 (ref 5–15)
BUN: 17 mg/dL (ref 6–20)
CO2: 25 mmol/L (ref 22–32)
Calcium: 8.3 mg/dL — ABNORMAL LOW (ref 8.9–10.3)
Chloride: 109 mmol/L (ref 98–111)
Creatinine, Ser: 0.72 mg/dL (ref 0.44–1.00)
GFR, Estimated: >60 mL/min (ref >60)
Glucose, Bld: 156 mg/dL — ABNORMAL HIGH (ref 70–99)
Potassium: 3.6 mmol/L (ref 3.5–5.1)
Sodium: 141 mmol/L (ref 135–145)
Total Bilirubin: 0.5 mg/dL (ref 0.3–1.2)
Total Protein: 6.6 g/dL (ref 6.5–8.1)

## 2022-05-03 LAB — CBC
HCT: 41.5 % (ref 36.0–46.0)
Hemoglobin: 13.6 g/dL (ref 12.0–15.0)
MCH: 29.2 pg (ref 26.0–34.0)
MCHC: 32.8 g/dL (ref 30.0–36.0)
MCV: 89.1 fL (ref 80.0–100.0)
Platelets: 170 10*3/uL (ref 150–400)
RBC: 4.66 MIL/uL (ref 3.87–5.11)
RDW: 13.8 % (ref 11.5–15.5)
WBC: 6.9 10*3/uL (ref 4.0–10.5)
nRBC: 0 % (ref 0.0–0.2)

## 2022-05-03 LAB — LIPASE, BLOOD: Lipase: 45 U/L (ref 11–51)

## 2022-05-03 LAB — PREGNANCY, URINE: Preg Test, Ur: NEGATIVE

## 2022-05-03 MED ORDER — SODIUM CHLORIDE 0.9 % IV BOLUS
1000.0000 mL | Freq: Once | INTRAVENOUS | Status: AC
  Administered 2022-05-03: 1000 mL via INTRAVENOUS

## 2022-05-03 MED ORDER — FAMOTIDINE IN NACL 20-0.9 MG/50ML-% IV SOLN
20.0000 mg | Freq: Once | INTRAVENOUS | Status: AC
  Administered 2022-05-03: 20 mg via INTRAVENOUS
  Filled 2022-05-03: qty 50

## 2022-05-03 MED ORDER — DICYCLOMINE HCL 10 MG PO CAPS
20.0000 mg | ORAL_CAPSULE | Freq: Once | ORAL | Status: AC
  Administered 2022-05-03: 20 mg via ORAL
  Filled 2022-05-03: qty 2

## 2022-05-03 NOTE — ED Triage Notes (Addendum)
Generalized abdominal pain that started today after eating. Describes pain as "tight". Took gas-x with some relief. States she thinks she has diverticulitis.

## 2022-05-04 DIAGNOSIS — R1310 Dysphagia, unspecified: Secondary | ICD-10-CM | POA: Diagnosis not present

## 2022-05-04 DIAGNOSIS — R0989 Other specified symptoms and signs involving the circulatory and respiratory systems: Secondary | ICD-10-CM | POA: Diagnosis not present

## 2022-05-04 DIAGNOSIS — R070 Pain in throat: Secondary | ICD-10-CM | POA: Diagnosis not present

## 2022-05-04 DIAGNOSIS — J310 Chronic rhinitis: Secondary | ICD-10-CM | POA: Diagnosis not present

## 2022-05-04 DIAGNOSIS — R490 Dysphonia: Secondary | ICD-10-CM | POA: Diagnosis not present

## 2022-05-04 DIAGNOSIS — R1319 Other dysphagia: Secondary | ICD-10-CM | POA: Diagnosis not present

## 2022-05-04 MED ORDER — DICYCLOMINE HCL 20 MG PO TABS: 20.0000 mg | ORAL_TABLET | Freq: Two times a day (BID) | ORAL | 0 refills | Status: DC

## 2022-05-04 NOTE — Discharge Instructions (Addendum)
Please follow-up with your gastroenterologist and primary care provider.  Return the emergency room for any new or concerning symptoms.  I recommended and prescribed a medication called Bentyl.

## 2022-05-04 NOTE — ED Provider Notes (Signed)
[facility] Provider Note   CSN: [id number] Arrival date & time: 05/03/22  2051     History  Chief Complaint  Patient presents with   Abdominal Pain    Jennifer [name] is a 56 y.o. female.   Abdominal Pain  Patient is a 56 year old woman with an extensive past medical history, currently on care plan. Patient is s/p cholecystectomy and appendectomy. Presents today for evaluation of stomach "tightness". Patient has a baseline of shortness of breath related to asthma. Has a history of episodes of vigorous cough that cause her to throw up. Patient suspects she has pulled a stomach muscle due to cough and vomiting episode three days ago. Tried to eat a meal around 1300 today and felt nauseous and like her stomach was tight. No vomiting. Bowel movements are baseline. No chest pain. Took one dose of famotidine earlier today but has not been able to take second dose yet. Tried gas-ex medication after meal with no relief. Was able to eat a small amount of chicken salad roughly 2-3 hours ago but still felt stomach discomfort and frequent belching so decided to come in.         Home Medications Prior to Admission medications   Medication Sig Start Date End Date Taking? Authorizing Provider  dicyclomine (BENTYL) 20 MG tablet Take 1 tablet (20 mg total) by mouth 2 (two) times daily. 05/04/22  Yes Constantinos Krempasky, Kathleene Hazel, PA  ACCU-CHEK GUIDE test strip For testing blood sugars dailyDx:r73.03 06/17/21   [name], MD  bisacodyl (DULCOLAX) 5 MG EC tablet Take by mouth.    [provider]  chlorhexidine (PERIDEX) 0.12 % solution SMARTSIG:15 Milliliter(s) By Mouth Morning-Night 04/24/22   [provider]  clindamycin (CLEOCIN) 75 MG capsule Take 75 mg by mouth 3 (three) times daily.    [provider]  diazepam (VALIUM) 2 MG tablet Take 1 tablet (2 mg total) by mouth daily as needed for anxiety. 05/01/22   [name], MD   EPINEPHrine 0.3 mg/0.3 mL IJ SOAJ injection Use as directed for severe allergic reactions 11/10/21   Althea Charon, FNP  famotidine (PEPCID) 20 MG tablet Take by mouth. 05/06/21   [provider]  flunisolide (NASALIDE) 25 MCG/ACT (0.025%) SOLN Place 2 sprays into the nose daily as needed (for stuffy nose). 04/11/22   Roney Marion, MD  fluticasone (FLOVENT HFA) 110 MCG/ACT inhaler Inhale 1-2 puffs into the lungs in the morning and at bedtime. rinse mouth afterwards. 04/26/22   Garnet Sierras, DO  guaiFENesin (ROBITUSSIN) 100 MG/5ML liquid Take 5 mLs by mouth every 4 (four) hours as needed for cough or to loosen phlegm.    [provider]  levalbuterol (XOPENEX HFA) 45 MCG/ACT inhaler INHALE 2 PUFFS INTO THE LUNGS EVERY 6 HOURS AS NEEDED FOR WHEEZING 04/11/22   Roney Marion, MD  metoprolol tartrate (LOPRESSOR) 50 MG tablet Take 1 tablet (50 mg total) by mouth 2 (two) times daily. 11/24/21   [name], MD  potassium chloride (KLOR-CON) 20 MEQ packet Take 20 mEq by mouth as needed.    [provider]  spironolactone (ALDACTONE) 25 MG tablet Take 25 mg by mouth as needed.    [provider]      Allergies    Azithromycin, Ciprofloxacin, Codeine, Erythromycin base, Sulfa antibiotics, Sulfasalazine, Telmisartan, Ace inhibitors, Aspirin, Atenolol, Avelox [moxifloxacin hcl in nacl], Beta adrenergic blockers, Buspar [buspirone], Butorphanol tartrate, Cetirizine, Clonidine hcl, Cortisone, Erythromycin, Fentanyl, Fluoxetine hcl,  Ketorolac tromethamine, Lidocaine, Lisinopril, Metoclopramide hcl, Midazolam, Montelukast, Montelukast sodium, Naproxen, Paroxetine, Penicillins, Pravastatin, Promethazine, Promethazine hcl, Quinolones, Serotonin reuptake inhibitors (ssris), Sertraline hcl, Stelazine [trifluoperazine], Tobramycin, Trifluoperazine hcl, Atrovent nasal spray [ipratropium], Diltiazem, Iodinated contrast media, Polyethylene glycol 3350, Propoxyphene, Adhesive  [tape], Butorphanol, Ceftriaxone, Iron, Metoclopramide, Metronidazole, Other, Prednisone, Prochlorperazine, Venlafaxine, and Zyrtec [cetirizine hcl]    Review of Systems   Review of Systems  Gastrointestinal:  Positive for abdominal pain.    Physical Exam Updated Vital Signs BP (!) 154/91 (BP Location: Left Arm)   Pulse 79   Temp 98.4 F (36.9 C) (Oral)   Resp 18   Ht '5\' 2"'$  (1.575 m)   Wt 109 kg   LMP 06/25/2013   SpO2 98%   BMI 43.95 kg/m  Physical Exam Vitals and nursing note reviewed.  Constitutional:      General: She is not in acute distress.    Appearance: She is obese.     Comments: Pleasant well-appearing 56 year old.  In no acute distress.  Able answer questions appropriately follow commands. No increased work of breathing. Speaking in full sentences.  She is quite anxious and speaking rapidly.  HENT:     Head: Normocephalic and atraumatic.     Nose: Nose normal.     Mouth/Throat:     Mouth: Mucous membranes are dry.  Eyes:     General: No scleral icterus. Cardiovascular:     Rate and Rhythm: Normal rate and regular rhythm.     Pulses: Normal pulses.     Heart sounds: Normal heart sounds.  Pulmonary:     Effort: Pulmonary effort is normal. No respiratory distress.     Breath sounds: No wheezing.  Abdominal:     Palpations: Abdomen is soft.     Tenderness: There is no abdominal tenderness. There is no guarding or rebound.  Musculoskeletal:     Cervical back: Normal range of motion.     Right lower leg: No edema.     Left lower leg: No edema.  Skin:    General: Skin is warm and dry.     Capillary Refill: Capillary refill takes less than 2 seconds.  Neurological:     Mental Status: She is alert. Mental status is at baseline.  Psychiatric:        Mood and Affect: Mood normal.        Behavior: Behavior normal.     ED Results / Procedures / Treatments   Labs (all labs ordered are listed, but only abnormal results are displayed) Labs Reviewed   COMPREHENSIVE METABOLIC PANEL - Abnormal; Notable for the following components:      Result Value   Glucose, Bld 156 (*)    Calcium 8.3 (*)    All other components within normal limits  LIPASE, BLOOD  CBC  URINALYSIS, ROUTINE W REFLEX MICROSCOPIC  PREGNANCY, URINE    EKG None  Radiology No results found.  Procedures Procedures    Medications Ordered in ED Medications  sodium chloride 0.9 % bolus 1,000 mL ( Intravenous Stopped 05/04/22 0001)  famotidine (PEPCID) IVPB 20 mg premix (0 mg Intravenous Stopped 05/03/22 2333)  dicyclomine (BENTYL) capsule 20 mg (20 mg Oral Given 05/03/22 2259)    ED Course/ Medical Decision Making/ A&P                           Medical Decision Making Amount and/or Complexity of Data Reviewed Labs: ordered.  Risk Prescription drug management.  This patient presents to the ED for concern of abdominal pain, this involves a number of treatment options, and is a complaint that carries with it a moderate to high risk of complications and morbidity.  The differential diagnosis includes   The causes of generalized abdominal pain include but are not limited to AAA, mesenteric ischemia, appendicitis, diverticulitis, DKA, gastritis, gastroenteritis, AMI, nephrolithiasis, pancreatitis, peritonitis, adrenal insufficiency,lead poisoning, iron toxicity, intestinal ischemia, constipation, UTI,SBO/LBO, splenic rupture, biliary disease, IBD, IBS, PUD, or hepatitis. Ectopic pregnancy, ovarian torsion, PID.    Co morbidities: Discussed in HPI   Brief History:  Patient is a 55 year old woman with an extensive past medical history, currently on care plan. Patient is s/p cholecystectomy and appendectomy. Presents today for evaluation of stomach "tightness". Patient has a baseline of shortness of breath related to asthma. Has a history of episodes of vigorous cough that cause her to throw up. Patient suspects she has pulled a stomach muscle due to cough and  vomiting episode three days ago. Tried to eat a meal around 1300 today and felt nauseous and like her stomach was tight. No vomiting. Bowel movements are baseline. No chest pain. Took one dose of famotidine earlier today but has not been able to take second dose yet. Tried gas-ex medication after meal with no relief. Was able to eat a small amount of chicken salad roughly 2-3 hours ago but still felt stomach discomfort and frequent belching so decided to come in.     EMR reviewed including pt PMHx, past surgical history and past visits to ER.   See HPI for more details   Lab Tests:   I personally reviewed all laboratory work and imaging. Metabolic panel without any acute abnormality specifically kidney function within normal limits and no significant electrolyte abnormalities. CBC without leukocytosis or significant anemia.   Imaging Studies:  No imaging studies ordered for this patient  Lungs are clear, she is not coughing up any sputum she has been afebrile.  She is overall well-appearing no coughing while in room.  We will hold off on x-ray or cross-sectional imaging of chest.  Cardiac Monitoring:  The patient was maintained on a cardiac monitor.  I personally viewed and interpreted the cardiac monitored which showed an underlying rhythm of: NSR NA   Medicines ordered:  I ordered medication including Bentyl, Pepcid, 1 L normal saline for abdominal pain Reevaluation of the patient after these medicines showed that the patient improved I have reviewed the patients home medicines and have made adjustments as needed   Critical Interventions:     Consults/Attending Physician      Reevaluation:  After the interventions noted above I re-evaluated patient and found that they have :improved   Social Determinants of Health:      Problem List / ED Course:  Abdominal pain improving after Pepcid and Bentyl. She does have a history of diverticulitis on CT scan.  I  reviewed CT scan.  This seems to be an area of inflammation without any perforation or abscess or complications.  She was treated with oral antibiotics.  She has had numerous CT scans in the past.  She has no focal abdominal tenderness I have low suspicion for diverticulitis.  Her discomfort seems to be generalized and she has no abdominal tenderness at all on my exam. Dehydration -hydrated by IV and tolerating p.o. now.   Dispostion:  After consideration of the diagnostic results and the patients response to treatment, I feel that the patent would  benefit from conservative therapy, Bentyl, follow-up with the GI provider that she follows with.   Final Clinical Impression(s) / ED Diagnoses Final diagnoses:  Generalized abdominal pain    Rx / DC Orders ED Discharge Orders          Ordered    dicyclomine (BENTYL) 20 MG tablet  2 times daily        05/04/22 0006              Pati Gallo [place], [place] 05/04/22 1926    Tretha Sciara, MD 05/05/22 1504

## 2022-05-05 ENCOUNTER — Ambulatory Visit: Payer: Medicare HMO | Admitting: Family Medicine

## 2022-05-05 ENCOUNTER — Ambulatory Visit (INDEPENDENT_AMBULATORY_CARE_PROVIDER_SITE_OTHER): Payer: Medicare Other | Admitting: Clinical

## 2022-05-05 ENCOUNTER — Telehealth (HOSPITAL_COMMUNITY): Payer: Self-pay

## 2022-05-05 DIAGNOSIS — R002 Palpitations: Secondary | ICD-10-CM | POA: Diagnosis not present

## 2022-05-05 DIAGNOSIS — F332 Major depressive disorder, recurrent severe without psychotic features: Secondary | ICD-10-CM | POA: Diagnosis not present

## 2022-05-05 DIAGNOSIS — F419 Anxiety disorder, unspecified: Secondary | ICD-10-CM | POA: Diagnosis not present

## 2022-05-05 DIAGNOSIS — F4310 Post-traumatic stress disorder, unspecified: Secondary | ICD-10-CM

## 2022-05-05 DIAGNOSIS — E876 Hypokalemia: Secondary | ICD-10-CM | POA: Diagnosis not present

## 2022-05-05 NOTE — Progress Notes (Signed)
                Jachelle Fluty, LCSW 

## 2022-05-05 NOTE — Progress Notes (Signed)
[facility] Counselor/Therapist Progress Note  Patient ID: CLYDELL ALBERTS, MRN: [id number],    Date: 05/05/2022  Time Spent: 10:30am-11:40am (70 minutes)   Treatment Type: Individual Therapy  Reported Symptoms: Patient reported experiencing tachycardia recently and is currently wearing a heart monitor for 30 days.   Mental Status Exam: Appearance:  Neat     Behavior: Appropriate  Motor: Normal  Speech/Language:  Clear and Coherent  Affect: Tearful  Mood: frustrated  Thought process: normal  Thought content:   WNL  Sensory/Perceptual disturbances:   WNL  Orientation: oriented to person and place  Attention: Good  Concentration: Good  Memory: WNL  Fund of knowledge:  Good  Insight:   Good  Judgment:  Fair  Impulse Control: Fair   Risk Assessment: Danger to Self:  No. Patient denied current suicidal ideation.  Self-injurious Behavior: No Danger to Others: No. Patient denied current homicidal ideation. Duty to Warn:no Physical Aggression / Violence:No  Access to Firearms a concern: No  Gang Involvement:No   Subjective: Patient reported feeling current medical symptoms contribute to feelings of anxiety. Patient reported she is frustrated with current medical conditions and not feeling heard by medical providers. Patient reported appointment with specialist yesterday was a stressor and reported her mood was irritable as a result. Patient reported her step daughter recently moved back home. Patient reported the financial strain of another person in the home is a stressor. Patient reported she is not surprised about the diagnoses. Patient reported she has tried multiple medications in the past and experienced side effects, such as, feeling as if bugs were crawling on her skin and headaches. Patient reported feeling "trapped" in her current environment and would like to move. Patient reported feeling she has regressed without counseling. Patient reported she is still  interested in pursuing [facility] treatment and is open to a referral to a psychiatrist to discuss non-medication treatment options. Patient reported she is open to a referral for trauma focused therapy. Patient reported transportation is a current barrier and prefers a provider that offers virtual visits.   Interventions: Clinician conducted session via Webex video from clinician's home office. Patient provided verbal consent to proceed with telehealth session and participated in session from patient's home. Clinician reviewed with patient diagnoses and treatment recommendations. Clinician provided psycho education related to diagnoses and treatment recommendations.   Diagnosis: Posttraumatic Stress Disorder Major Depressive Disorder, recurrent, severe Unspecified Anxiety Disorder  Plan: patient to be referred to a psychiatrist and to a provider who offers trauma focused therapy  [name], LCSW

## 2022-05-06 ENCOUNTER — Emergency Department (HOSPITAL_BASED_OUTPATIENT_CLINIC_OR_DEPARTMENT_OTHER)
Admission: EM | Admit: 2022-05-06 | Discharge: 2022-05-06 | Disposition: A | Discharge status: Home / Self Care | Payer: Medicare Other | Attending: Emergency Medicine | Admitting: Emergency Medicine

## 2022-05-06 ENCOUNTER — Other Ambulatory Visit: Payer: Self-pay

## 2022-05-06 ENCOUNTER — Encounter (HOSPITAL_BASED_OUTPATIENT_CLINIC_OR_DEPARTMENT_OTHER): Payer: Self-pay | Admitting: Emergency Medicine

## 2022-05-06 DIAGNOSIS — R197 Diarrhea, unspecified: Secondary | ICD-10-CM | POA: Diagnosis not present

## 2022-05-06 DIAGNOSIS — Z885 Allergy status to narcotic agent status: Secondary | ICD-10-CM | POA: Diagnosis not present

## 2022-05-06 DIAGNOSIS — I700 Atherosclerosis of aorta: Secondary | ICD-10-CM | POA: Diagnosis not present

## 2022-05-06 DIAGNOSIS — R1031 Right lower quadrant pain: Secondary | ICD-10-CM | POA: Diagnosis not present

## 2022-05-06 DIAGNOSIS — Z87891 Personal history of nicotine dependence: Secondary | ICD-10-CM | POA: Diagnosis not present

## 2022-05-06 DIAGNOSIS — I10 Essential (primary) hypertension: Secondary | ICD-10-CM | POA: Diagnosis not present

## 2022-05-06 DIAGNOSIS — Z880 Allergy status to penicillin: Secondary | ICD-10-CM | POA: Diagnosis not present

## 2022-05-06 DIAGNOSIS — Z888 Allergy status to other drugs, medicaments and biological substances status: Secondary | ICD-10-CM | POA: Diagnosis not present

## 2022-05-06 DIAGNOSIS — R110 Nausea: Secondary | ICD-10-CM | POA: Diagnosis not present

## 2022-05-06 DIAGNOSIS — Z886 Allergy status to analgesic agent status: Secondary | ICD-10-CM | POA: Diagnosis not present

## 2022-05-06 DIAGNOSIS — R1084 Generalized abdominal pain: Secondary | ICD-10-CM | POA: Diagnosis not present

## 2022-05-06 DIAGNOSIS — R1030 Lower abdominal pain, unspecified: Secondary | ICD-10-CM | POA: Diagnosis not present

## 2022-05-06 DIAGNOSIS — K5730 Diverticulosis of large intestine without perforation or abscess without bleeding: Secondary | ICD-10-CM | POA: Diagnosis not present

## 2022-05-06 DIAGNOSIS — E119 Type 2 diabetes mellitus without complications: Secondary | ICD-10-CM | POA: Diagnosis not present

## 2022-05-06 DIAGNOSIS — Z882 Allergy status to sulfonamides status: Secondary | ICD-10-CM | POA: Diagnosis not present

## 2022-05-06 DIAGNOSIS — Z91018 Allergy to other foods: Secondary | ICD-10-CM | POA: Diagnosis not present

## 2022-05-06 DIAGNOSIS — N3289 Other specified disorders of bladder: Secondary | ICD-10-CM | POA: Diagnosis not present

## 2022-05-06 DIAGNOSIS — Z91010 Allergy to peanuts: Secondary | ICD-10-CM | POA: Diagnosis not present

## 2022-05-06 DIAGNOSIS — K760 Fatty (change of) liver, not elsewhere classified: Secondary | ICD-10-CM | POA: Diagnosis not present

## 2022-05-06 DIAGNOSIS — R1032 Left lower quadrant pain: Secondary | ICD-10-CM | POA: Diagnosis not present

## 2022-05-06 DIAGNOSIS — Z79899 Other long term (current) drug therapy: Secondary | ICD-10-CM | POA: Diagnosis not present

## 2022-05-06 DIAGNOSIS — Z881 Allergy status to other antibiotic agents status: Secondary | ICD-10-CM | POA: Diagnosis not present

## 2022-05-06 DIAGNOSIS — G35 Multiple sclerosis: Secondary | ICD-10-CM | POA: Diagnosis not present

## 2022-05-06 DIAGNOSIS — K219 Gastro-esophageal reflux disease without esophagitis: Secondary | ICD-10-CM | POA: Diagnosis not present

## 2022-05-06 NOTE — ED Notes (Signed)
Pt did not want her paperwork and left without it.

## 2022-05-06 NOTE — ED Triage Notes (Addendum)
Abd pain and sob because her stomach is tight. seen yesterday at [facility] states took a Environmental education officer nd a colace  to help her poop

## 2022-05-06 NOTE — ED Provider Notes (Signed)
[facility] Provider Note   CSN: [id number] Arrival date & time: 05/06/22  1843     History  Chief Complaint  Patient presents with   Abdominal Pain    Jennifer [name] is a 56 y.o. female.  56 yo F with a cc of abdominal pain.  This has been going on for the past week or so.  Feels like her abdomen is distended.  Has had some nausea but no vomiting.  She feels like her stools have been pencil thin.  She was told by her rad tech that this could be due to a partial obstruction.  She was seen yesterday as another emergency department and was worked up for palpitations.  She tell me she was concerned about her lab work and with worsening pain came here to be reevaluated as she was seen 48 hours ago for abdominal discomfort.  She has tried to clean herself out at home with an enema but was not successful.  She feels like her abdomen so distended that is making her have trouble breathing.   Abdominal Pain      Home Medications Prior to Admission medications   Medication Sig Start Date End Date Taking? Authorizing Provider  ACCU-CHEK GUIDE test strip For testing blood sugars dailyDx:r73.03 06/17/21   [name], MD  bisacodyl (DULCOLAX) 5 MG EC tablet Take by mouth.    [provider]  chlorhexidine (PERIDEX) 0.12 % solution SMARTSIG:15 Milliliter(s) By Mouth Morning-Night 04/24/22   [provider]  clindamycin (CLEOCIN) 75 MG capsule Take 75 mg by mouth 3 (three) times daily.    [provider]  diazepam (VALIUM) 2 MG tablet Take 1 tablet (2 mg total) by mouth daily as needed for anxiety. 05/01/22   [name], MD  dicyclomine (BENTYL) 20 MG tablet Take 1 tablet (20 mg total) by mouth 2 (two) times daily. 05/04/22   Tedd Sias, PA  EPINEPHrine 0.3 mg/0.3 mL IJ SOAJ injection Use as directed for severe allergic reactions 11/10/21   Althea Charon, FNP  famotidine (PEPCID) 20 MG tablet Take by mouth.  05/06/21   [provider]  flunisolide (NASALIDE) 25 MCG/ACT (0.025%) SOLN Place 2 sprays into the nose daily as needed (for stuffy nose). 04/11/22   Roney Marion, MD  fluticasone (FLOVENT HFA) 110 MCG/ACT inhaler Inhale 1-2 puffs into the lungs in the morning and at bedtime. rinse mouth afterwards. 04/26/22   Garnet Sierras, DO  guaiFENesin (ROBITUSSIN) 100 MG/5ML liquid Take 5 mLs by mouth every 4 (four) hours as needed for cough or to loosen phlegm.    [provider]  levalbuterol (XOPENEX HFA) 45 MCG/ACT inhaler INHALE 2 PUFFS INTO THE LUNGS EVERY 6 HOURS AS NEEDED FOR WHEEZING 04/11/22   Roney Marion, MD  metoprolol tartrate (LOPRESSOR) 50 MG tablet Take 1 tablet (50 mg total) by mouth 2 (two) times daily. 11/24/21   [name], MD  potassium chloride (KLOR-CON) 20 MEQ packet Take 20 mEq by mouth as needed.    [provider]  spironolactone (ALDACTONE) 25 MG tablet Take 25 mg by mouth as needed.    [provider]      Allergies    Azithromycin, Ciprofloxacin, Codeine, Erythromycin base, Sulfa antibiotics, Sulfasalazine, Telmisartan, Ace inhibitors, Aspirin, Atenolol, Avelox [moxifloxacin hcl in nacl], Beta adrenergic blockers, Buspar [buspirone], Butorphanol tartrate, Cetirizine, Clonidine hcl, Cortisone, Erythromycin, Fentanyl, Fluoxetine hcl, Ketorolac tromethamine, Lidocaine, Lisinopril, Metoclopramide hcl, Midazolam, Montelukast, Montelukast sodium, Naproxen, Paroxetine, Penicillins,  Pravastatin, Promethazine, Promethazine hcl, Quinolones, Serotonin reuptake inhibitors (ssris), Sertraline hcl, Stelazine [trifluoperazine], Tobramycin, Trifluoperazine hcl, Atrovent nasal spray [ipratropium], Diltiazem, Iodinated contrast media, Polyethylene glycol 3350, Propoxyphene, Adhesive [tape], Butorphanol, Ceftriaxone, Iron, Metoclopramide, Metronidazole, Other, Prednisone, Prochlorperazine, Venlafaxine, and Zyrtec [cetirizine hcl]    Review of Systems    Review of Systems  Gastrointestinal:  Positive for abdominal pain.    Physical Exam Updated Vital Signs BP (!) 155/97 (BP Location: Left Arm)   Pulse (!) 114   Temp 98.9 F (37.2 C) (Oral)   Resp 16   Ht '5\' 2"'$  (1.575 m)   Wt 109 kg   LMP 06/25/2013   SpO2 99%   BMI 43.95 kg/m  Physical Exam Vitals and nursing note reviewed.  Constitutional:      General: She is not in acute distress.    Appearance: She is well-developed. She is not diaphoretic.     Comments: BMI 44  HENT:     Head: Normocephalic and atraumatic.  Eyes:     Pupils: Pupils are equal, round, and reactive to light.  Cardiovascular:     Rate and Rhythm: Normal rate and regular rhythm.     Heart sounds: No murmur heard.    No friction rub. No gallop.  Pulmonary:     Effort: Pulmonary effort is normal.     Breath sounds: No wheezing or rales.  Abdominal:     General: There is no distension.     Palpations: Abdomen is soft.     Tenderness: There is no abdominal tenderness.     Comments: Tympanitic abdomen.  Difficult exam due to body habitus.  No obvious significant pain.  Musculoskeletal:        General: No tenderness.     Cervical back: Normal range of motion and neck supple.  Skin:    General: Skin is warm and dry.  Neurological:     Mental Status: She is alert and oriented to person, place, and time.  Psychiatric:        Behavior: Behavior normal.     ED Results / Procedures / Treatments   Labs (all labs ordered are listed, but only abnormal results are displayed) Labs Reviewed - No data to display  EKG None  Radiology No results found.  Procedures Procedures    Medications Ordered in ED Medications - No data to display  ED Course/ Medical Decision Making/ A&P                           Medical Decision Making  56 yo F with a chief complaints of abdominal pain.  This is a chronic problem for this patient.  She is well-known to this emergency department with 14 visits in the past 6  months and a care plan.  She has been seen 3 times in Emergency Department setting in the past 3 days.  She tells me that her abdominal pain is worsened.  She is well-appearing and nontoxic.  Is no obvious focal abdominal discomfort for me.  She had lab work done in outside hospital yesterday that I reviewed that does not have any significant findings.  Patient wanted lab work and imaging performed.  I discussed with her how I did not feel that that was warranted.  I encouraged her to follow-up with her family doctor and GI doctor in the office.  She was reluctant to do a MiraLAX cleanout at home.  I encouraged her to call her family  doctor if she wanted to try something else.  7:45 PM:  I have discussed the diagnosis/risks/treatment options with the patient and family.  Evaluation and diagnostic testing in the emergency department does not suggest an emergent condition requiring admission or immediate intervention beyond what has been performed at this time.  They will follow up with PCP, GI. We also discussed returning to the ED immediately if new or worsening sx occur. We discussed the sx which are most concerning (e.g., sudden worsening pain, fever, inability to tolerate by mouth) that necessitate immediate return. Medications administered to the patient during their visit and any new prescriptions provided to the patient are listed below.  Medications given during this visit Medications - No data to display   The patient appears reasonably screen and/or stabilized for discharge and I doubt any other medical condition or other [facility] requiring further screening, evaluation, or treatment in the ED at this time prior to discharge.          Final Clinical Impression(s) / ED Diagnoses Final diagnoses:  Generalized abdominal pain    Rx / DC Orders ED Discharge Orders     None         Deno Etienne, DO 05/06/22 1945

## 2022-05-06 NOTE — Discharge Instructions (Signed)
Take 8 scoops of miralax in 32oz of whatever you would like to drink.(Gatorade comes in this size) You can also use a fleets enema which you can buy over the counter at the pharmacy.  Return for worsening abdominal pain, vomiting or fever. ? ?

## 2022-05-06 NOTE — ED Notes (Signed)
Pt left before RN could complete assessment. Provider went in first and then pt left.

## 2022-05-08 ENCOUNTER — Telehealth: Payer: Self-pay

## 2022-05-08 ENCOUNTER — Encounter: Payer: Self-pay | Admitting: Obstetrics & Gynecology

## 2022-05-08 DIAGNOSIS — R072 Precordial pain: Secondary | ICD-10-CM | POA: Diagnosis not present

## 2022-05-08 DIAGNOSIS — R9431 Abnormal electrocardiogram [ECG] [EKG]: Secondary | ICD-10-CM | POA: Diagnosis not present

## 2022-05-08 DIAGNOSIS — R404 Transient alteration of awareness: Secondary | ICD-10-CM | POA: Diagnosis not present

## 2022-05-08 DIAGNOSIS — R0789 Other chest pain: Secondary | ICD-10-CM | POA: Diagnosis not present

## 2022-05-08 DIAGNOSIS — Z743 Need for continuous supervision: Secondary | ICD-10-CM | POA: Diagnosis not present

## 2022-05-08 DIAGNOSIS — I499 Cardiac arrhythmia, unspecified: Secondary | ICD-10-CM | POA: Diagnosis not present

## 2022-05-08 DIAGNOSIS — E1165 Type 2 diabetes mellitus with hyperglycemia: Secondary | ICD-10-CM | POA: Diagnosis not present

## 2022-05-08 DIAGNOSIS — R079 Chest pain, unspecified: Secondary | ICD-10-CM | POA: Diagnosis not present

## 2022-05-08 DIAGNOSIS — R6889 Other general symptoms and signs: Secondary | ICD-10-CM | POA: Diagnosis not present

## 2022-05-08 NOTE — Telephone Encounter (Signed)
Patient was returning a phone call here to the office.  Advised I would get the nurse to call her back on the number she provided.  [phone].Marland Kitchen

## 2022-05-09 ENCOUNTER — Telehealth: Payer: Self-pay

## 2022-05-09 DIAGNOSIS — R9431 Abnormal electrocardiogram [ECG] [EKG]: Secondary | ICD-10-CM | POA: Diagnosis not present

## 2022-05-09 NOTE — Telephone Encounter (Signed)
Pt called stating she needs to reschedule her appointment for tomorrow d/t having heart issues. She has a call into her cardiac MD. Due to transportation issues she prefers a Friday afternoon. Also, pt states her Pagets is starting to crack and hurts. Advised her to see her GYN and maybe they can give her something for it until she is seen here.    (Per Joylene John NP she needs to see Dr. Delsa Sale) Pt is aware and can not understand why she can't see someone else. She is scheduled for 11/8 with J-M, but wants a call from [facility] so she can speak to her personally. Pt is aware Lenna Sciara is in clinic today and in the OR tomorrow so it may be later this week before she will get a call back. She voiced an understanding and said she is fine with waiting.

## 2022-05-10 ENCOUNTER — Telehealth: Payer: Self-pay | Admitting: Family Medicine

## 2022-05-10 ENCOUNTER — Inpatient Hospital Stay: Payer: Medicare Other | Admitting: Obstetrics & Gynecology

## 2022-05-10 DIAGNOSIS — R000 Tachycardia, unspecified: Secondary | ICD-10-CM | POA: Diagnosis not present

## 2022-05-10 DIAGNOSIS — R072 Precordial pain: Secondary | ICD-10-CM | POA: Diagnosis not present

## 2022-05-10 NOTE — Telephone Encounter (Signed)
Ok to start an OTC magnesium supplement

## 2022-05-10 NOTE — Telephone Encounter (Signed)
Called and spoke w/pt and advised her of recommendations she stated that she was given Magnesium oxide 400 mg to take daily. She stated that she doesn't feel comfortable taking this because of other sxs that she has been experiencing.  Pt is in the ED now in [facility] she reports that she has been having a lot of issues with her heart. She said that she is not stressed. She is having pain at times at the center of her chest all day long.

## 2022-05-10 NOTE — Telephone Encounter (Signed)
Patient called and states she was seen at the hospital and is worried about her magnesium levels running low and would like to talk to PCP or MA about a medicine. Patient would like a call back at [phone]. Buel Ream

## 2022-05-12 ENCOUNTER — Telehealth: Payer: Self-pay

## 2022-05-12 DIAGNOSIS — R918 Other nonspecific abnormal finding of lung field: Secondary | ICD-10-CM | POA: Diagnosis not present

## 2022-05-12 DIAGNOSIS — R9431 Abnormal electrocardiogram [ECG] [EKG]: Secondary | ICD-10-CM | POA: Diagnosis not present

## 2022-05-12 DIAGNOSIS — R079 Chest pain, unspecified: Secondary | ICD-10-CM | POA: Diagnosis not present

## 2022-05-12 DIAGNOSIS — R002 Palpitations: Secondary | ICD-10-CM | POA: Diagnosis not present

## 2022-05-12 DIAGNOSIS — R0789 Other chest pain: Secondary | ICD-10-CM | POA: Diagnosis not present

## 2022-05-12 DIAGNOSIS — R030 Elevated blood-pressure reading, without diagnosis of hypertension: Secondary | ICD-10-CM | POA: Diagnosis not present

## 2022-05-12 NOTE — Telephone Encounter (Signed)
Jennifer [name] states that her husband works half a day on Friday and can only come for an appointment  then.  Told Jennifer [name] that the providers available to see her have clinic on a Monday or a Wednesday. Pt states hat she was in the ED today for her high heart rate and can not deal with this appointment issue now. She is trying to find a cardiologist.  She said that she will call back to the office  to r/s appointment on 06-21-22.

## 2022-05-15 DIAGNOSIS — R030 Elevated blood-pressure reading, without diagnosis of hypertension: Secondary | ICD-10-CM | POA: Diagnosis not present

## 2022-05-15 DIAGNOSIS — Z8679 Personal history of other diseases of the circulatory system: Secondary | ICD-10-CM | POA: Diagnosis not present

## 2022-05-15 DIAGNOSIS — R002 Palpitations: Secondary | ICD-10-CM | POA: Diagnosis not present

## 2022-05-15 DIAGNOSIS — R0789 Other chest pain: Secondary | ICD-10-CM | POA: Diagnosis not present

## 2022-05-15 DIAGNOSIS — R000 Tachycardia, unspecified: Secondary | ICD-10-CM | POA: Diagnosis not present

## 2022-05-16 DIAGNOSIS — R0789 Other chest pain: Secondary | ICD-10-CM | POA: Diagnosis not present

## 2022-05-16 DIAGNOSIS — R002 Palpitations: Secondary | ICD-10-CM | POA: Diagnosis not present

## 2022-05-16 IMAGING — CR DG CHEST 2V
2 series · 2 of 2 positions shown · non-contrast
Comparison: 01/30/2021

CLINICAL DATA: Chest pain

EXAM:
CHEST - 2 VIEW

[chest pa]
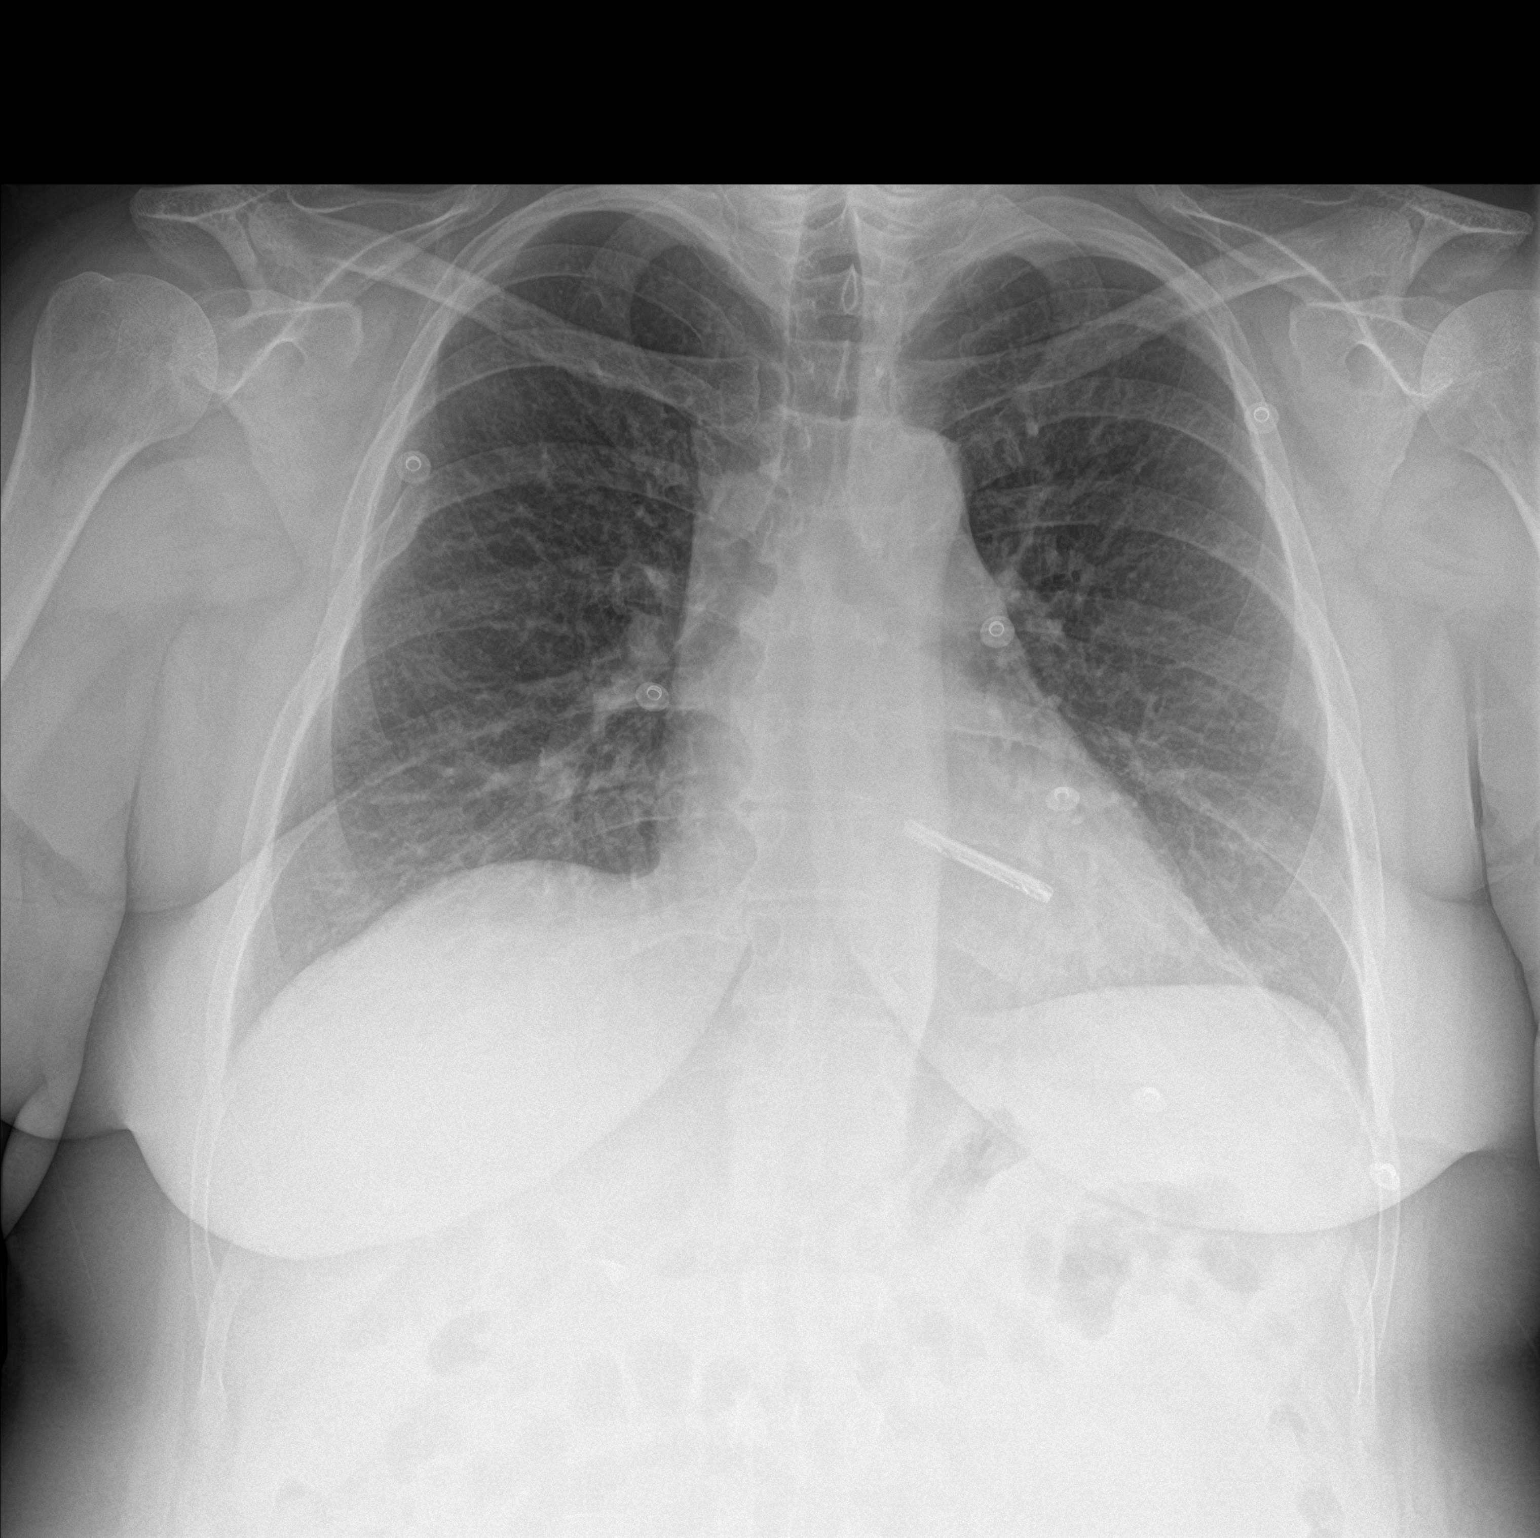

[chest lat]
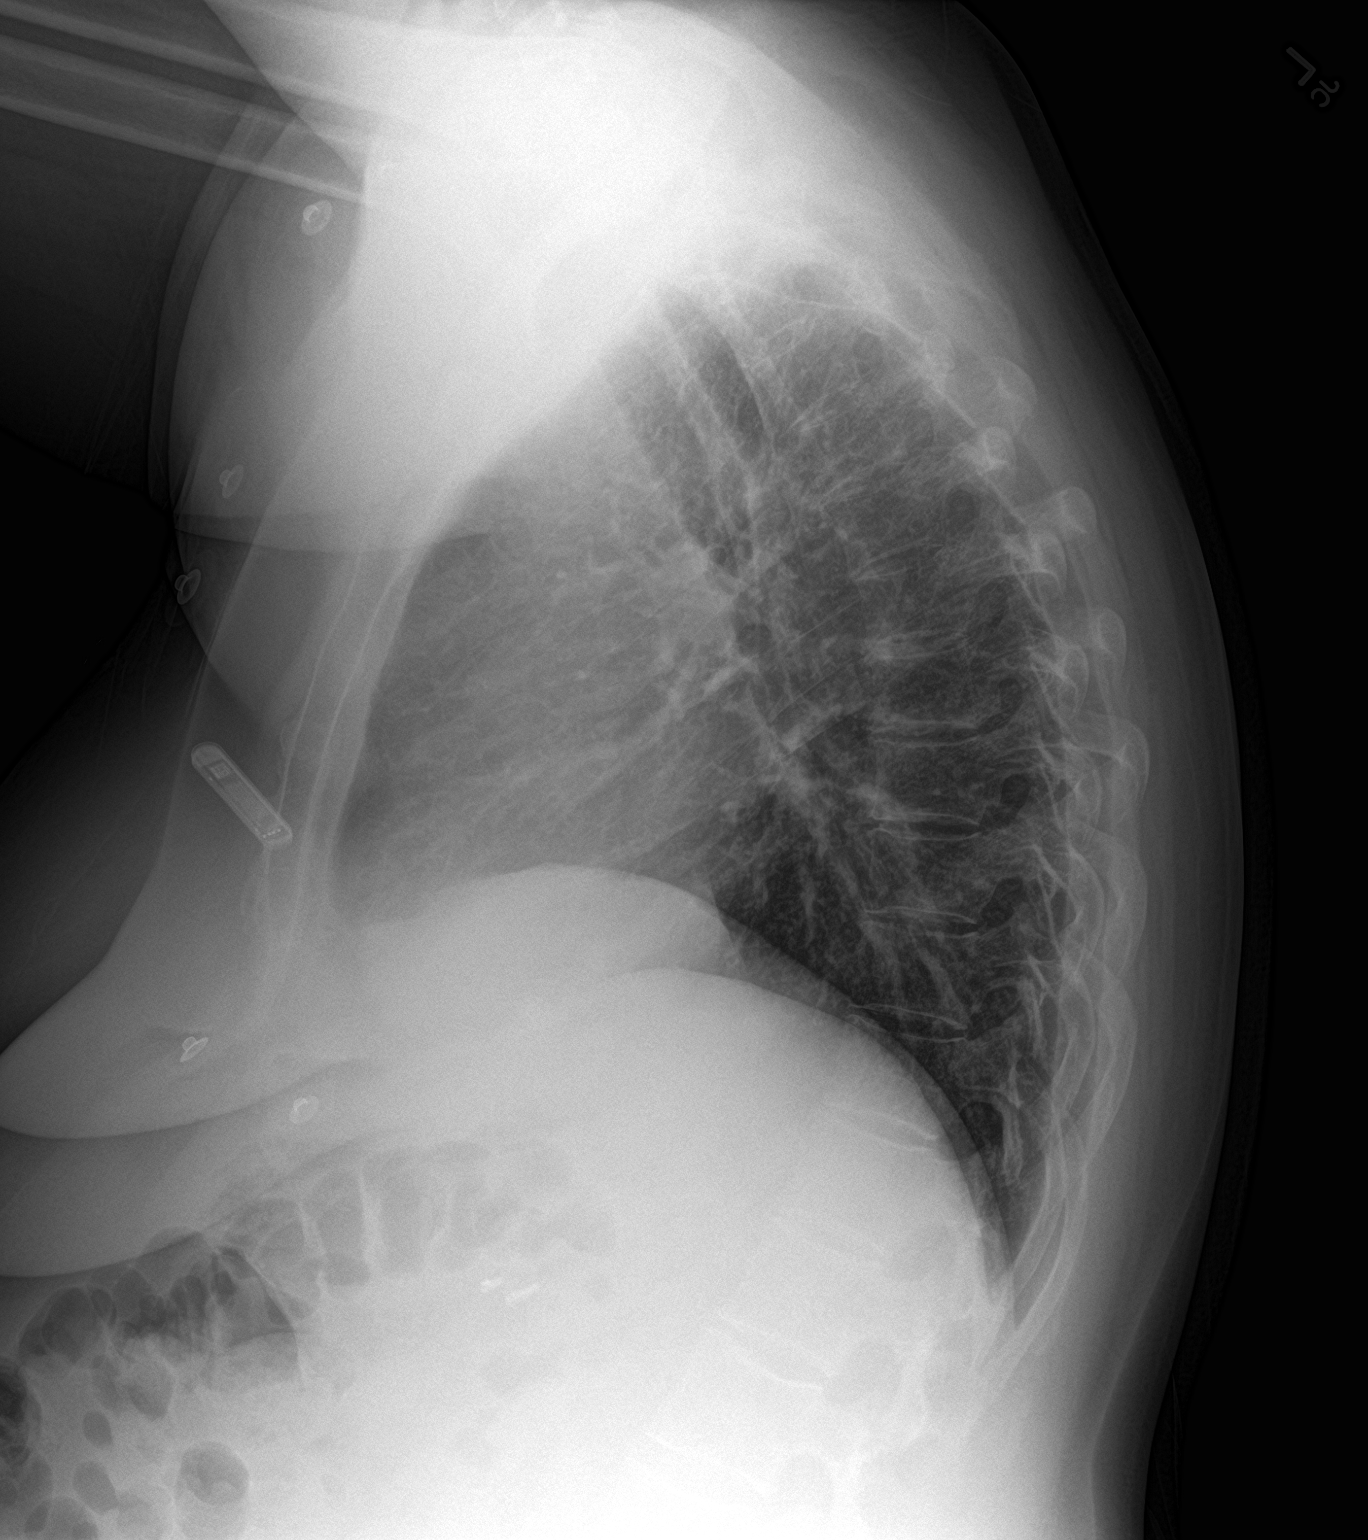

[2 of 2 positions shown; findings below may reference images not displayed]

FINDINGS: Cardiac shadow is stable. Loop recorder is again noted and stable.
The lungs are well aerated bilaterally. No focal infiltrate or
sizable effusion is seen. No bony abnormality is noted. Old rib
fractures are seen on the right.
IMPRESSION: No active cardiopulmonary disease.

## 2022-05-17 DIAGNOSIS — I4719 Other supraventricular tachycardia: Secondary | ICD-10-CM | POA: Diagnosis not present

## 2022-05-17 DIAGNOSIS — I10 Essential (primary) hypertension: Secondary | ICD-10-CM | POA: Diagnosis not present

## 2022-05-17 DIAGNOSIS — R002 Palpitations: Secondary | ICD-10-CM | POA: Diagnosis not present

## 2022-05-18 DIAGNOSIS — Z20822 Contact with and (suspected) exposure to covid-19: Secondary | ICD-10-CM | POA: Diagnosis not present

## 2022-05-18 DIAGNOSIS — Z1159 Encounter for screening for other viral diseases: Secondary | ICD-10-CM | POA: Diagnosis not present

## 2022-05-18 DIAGNOSIS — J069 Acute upper respiratory infection, unspecified: Secondary | ICD-10-CM | POA: Diagnosis not present

## 2022-05-18 DIAGNOSIS — N898 Other specified noninflammatory disorders of vagina: Secondary | ICD-10-CM | POA: Diagnosis not present

## 2022-05-19 ENCOUNTER — Telehealth (INDEPENDENT_AMBULATORY_CARE_PROVIDER_SITE_OTHER): Payer: Medicare HMO | Admitting: Family Medicine

## 2022-05-19 ENCOUNTER — Ambulatory Visit: Payer: Medicare HMO | Admitting: Family Medicine

## 2022-05-19 ENCOUNTER — Telehealth: Payer: Self-pay | Admitting: Family Medicine

## 2022-05-19 VITALS — HR 106

## 2022-05-19 DIAGNOSIS — K760 Fatty (change of) liver, not elsewhere classified: Secondary | ICD-10-CM | POA: Diagnosis not present

## 2022-05-19 DIAGNOSIS — I4719 Other supraventricular tachycardia: Secondary | ICD-10-CM | POA: Diagnosis not present

## 2022-05-19 DIAGNOSIS — N898 Other specified noninflammatory disorders of vagina: Secondary | ICD-10-CM

## 2022-05-19 DIAGNOSIS — F339 Major depressive disorder, recurrent, unspecified: Secondary | ICD-10-CM | POA: Diagnosis not present

## 2022-05-19 NOTE — Assessment & Plan Note (Addendum)
Coorindation of Care: Will call [facility] and see if they an see her while working to get her in with a trauma therapist.

## 2022-05-19 NOTE — Progress Notes (Signed)
Virtual Visit via Video Note  I connected with Jennifer [name] on 05/19/22 at  9:30 AM EDT by a video enabled telemedicine application and verified that I am speaking with the correct person using two identifiers.   I discussed the limitations of evaluation and management by telemedicine and the availability of in person appointments. The patient expressed understanding and agreed to proceed.  Patient location: at home Provider location: in office  Subjective:    CC:  No chief complaint on file.   HPI: She felt tachy today. Went to ED yesterday for this.she is back on the heart monitor for a month. Though are hoping to catch it.  She is taking metoprolol is '50mg'$ .   Saw a new therapist and was recommended a referral to a trauma therapist.  Trying to refer her to [facility]. They said to call back in one week if didn't hear from someone so she called back and they didn't know and she asked to have some therapy or treatment while waiting for the referral and was told someone would call her back same day and no one called her back.   Reschedule the sleep study in October.   Had CT abd/pel done 05/06/22 at [facility] and would like to address it. Concerned about fatty liver.   She has had some labial swelling. Onc appt is schedule for Nov. Did a wet prep in ED and it was negative. . Wears a pad daily bc she leaks urine when she coughs. Previous bx sight feels irritated.    Changed insurances recently.   Past medical history, Surgical history, Family history not pertinant except as noted below, Social history, Allergies, and medications have been entered into the medical record, reviewed, and corrections made.    Objective:    General: Speaking clearly in complete sentences without any shortness of breath.  Alert and oriented x3.  Normal judgment. No apparent acute distress.    Impression and Recommendations:    Problem List Items Addressed This Visit        Cardiovascular and Mediastinum   PAT (paroxysmal atrial tachycardia)    Has a heart monitor on . Has follow up with [facility]        Digestive   Fatty liver    Discussed diet and exercise changes to reduce fat and inflammation in the diet.  Plan for [place] Abd elastography. Has had dx since 2009.      Relevant Orders   US ABDOMEN COMPLETE W/ELASTOGRAPHY     Other   Depression, recurrent ([place]) - Primary    Coorindation of Care: Will call [facility] and see if they an see her while working to get her in with a trauma therapist.       Other Visit Diagnoses     Vaginal irritation          Vaginal irritation. - she plans to get back in with Gyn. If can't get in she will call our office. She has moved gynonc appt to November.    Orders Placed This Encounter  Procedures   US ABDOMEN COMPLETE W/ELASTOGRAPHY    Standing Status:   Future    Standing Expiration Date:   05/20/2023    Scheduling Instructions:     Pt will like a Friday    Order Specific Question:   Reason for Exam (SYMPTOM  OR DIAGNOSIS REQUIRED)    Answer:   fatty liver    Order Specific Question:   Preferred  imaging location?    Answer:   [facility]    No orders of the defined types were placed in this encounter.  I spent 40 minutes on the day of the encounter to include pre-visit record review, face-to-face time with the patient and post visit ordering of test.  I discussed the assessment and treatment plan with the patient. The patient was provided an opportunity to ask questions and all were answered. The patient agreed with the plan and demonstrated an understanding of the instructions.   The patient was advised to call back or seek an in-person evaluation if the symptoms worsen or if the condition fails to improve as anticipated.   Beatrice Lecher, MD

## 2022-05-19 NOTE — Assessment & Plan Note (Addendum)
Has a heart monitor on . Has follow up with [facility]

## 2022-05-19 NOTE — Telephone Encounter (Signed)
HI cindy,  Jennifer [name] was seen at [employer] by [name] at [facility]. They decided she needed a tauma therapist. She called back after not hearing from them for a week and says was told they would call her back and they haven't. She also asked if the therapist there could see her while waiting to be referred and they havent' called her back. Can you call and see if they can reach back out to her and maybe even be willing to see her a couple of more times while they are working on getting her in.  Also she has new insurance so we need to update her chart.

## 2022-05-19 NOTE — Assessment & Plan Note (Addendum)
Discussed diet and exercise changes to reduce fat and inflammation in the diet.  Plan for [place] Abd elastography. Has had dx since 2009.

## 2022-05-20 DIAGNOSIS — R002 Palpitations: Secondary | ICD-10-CM | POA: Diagnosis not present

## 2022-05-20 DIAGNOSIS — I499 Cardiac arrhythmia, unspecified: Secondary | ICD-10-CM | POA: Diagnosis not present

## 2022-05-20 DIAGNOSIS — R000 Tachycardia, unspecified: Secondary | ICD-10-CM | POA: Diagnosis not present

## 2022-05-20 DIAGNOSIS — R6889 Other general symptoms and signs: Secondary | ICD-10-CM | POA: Diagnosis not present

## 2022-05-20 DIAGNOSIS — R42 Dizziness and giddiness: Secondary | ICD-10-CM | POA: Diagnosis not present

## 2022-05-20 DIAGNOSIS — R079 Chest pain, unspecified: Secondary | ICD-10-CM | POA: Diagnosis not present

## 2022-05-20 DIAGNOSIS — Z743 Need for continuous supervision: Secondary | ICD-10-CM | POA: Diagnosis not present

## 2022-05-20 DIAGNOSIS — R404 Transient alteration of awareness: Secondary | ICD-10-CM | POA: Diagnosis not present

## 2022-05-22 ENCOUNTER — Ambulatory Visit: Payer: Medicare Other | Admitting: Physical Therapy

## 2022-05-22 DIAGNOSIS — R079 Chest pain, unspecified: Secondary | ICD-10-CM | POA: Diagnosis not present

## 2022-05-22 DIAGNOSIS — R000 Tachycardia, unspecified: Secondary | ICD-10-CM | POA: Diagnosis not present

## 2022-05-23 DIAGNOSIS — Z79899 Other long term (current) drug therapy: Secondary | ICD-10-CM | POA: Diagnosis not present

## 2022-05-23 DIAGNOSIS — R079 Chest pain, unspecified: Secondary | ICD-10-CM | POA: Diagnosis not present

## 2022-05-23 DIAGNOSIS — R002 Palpitations: Secondary | ICD-10-CM | POA: Diagnosis not present

## 2022-05-23 DIAGNOSIS — G4489 Other headache syndrome: Secondary | ICD-10-CM | POA: Diagnosis not present

## 2022-05-23 DIAGNOSIS — I10 Essential (primary) hypertension: Secondary | ICD-10-CM | POA: Diagnosis not present

## 2022-05-23 DIAGNOSIS — R000 Tachycardia, unspecified: Secondary | ICD-10-CM | POA: Diagnosis not present

## 2022-05-23 DIAGNOSIS — E876 Hypokalemia: Secondary | ICD-10-CM | POA: Diagnosis not present

## 2022-05-23 DIAGNOSIS — R9431 Abnormal electrocardiogram [ECG] [EKG]: Secondary | ICD-10-CM | POA: Diagnosis not present

## 2022-05-24 DIAGNOSIS — I10 Essential (primary) hypertension: Secondary | ICD-10-CM | POA: Diagnosis not present

## 2022-05-26 ENCOUNTER — Telehealth: Payer: Self-pay | Admitting: Family Medicine

## 2022-05-26 DIAGNOSIS — R0789 Other chest pain: Secondary | ICD-10-CM | POA: Diagnosis not present

## 2022-05-26 DIAGNOSIS — R5383 Other fatigue: Secondary | ICD-10-CM | POA: Diagnosis not present

## 2022-05-26 DIAGNOSIS — I10 Essential (primary) hypertension: Secondary | ICD-10-CM | POA: Diagnosis not present

## 2022-05-26 DIAGNOSIS — R002 Palpitations: Secondary | ICD-10-CM | POA: Diagnosis not present

## 2022-05-26 DIAGNOSIS — R000 Tachycardia, unspecified: Secondary | ICD-10-CM | POA: Diagnosis not present

## 2022-05-26 DIAGNOSIS — R110 Nausea: Secondary | ICD-10-CM | POA: Diagnosis not present

## 2022-05-26 DIAGNOSIS — N644 Mastodynia: Secondary | ICD-10-CM

## 2022-05-26 DIAGNOSIS — E611 Iron deficiency: Secondary | ICD-10-CM | POA: Diagnosis not present

## 2022-05-26 DIAGNOSIS — Z20822 Contact with and (suspected) exposure to covid-19: Secondary | ICD-10-CM | POA: Diagnosis not present

## 2022-05-26 DIAGNOSIS — F339 Major depressive disorder, recurrent, unspecified: Secondary | ICD-10-CM

## 2022-05-26 NOTE — Telephone Encounter (Signed)
Order placed. Pleease print and fax. I would recommend she call [facility]

## 2022-05-26 NOTE — Telephone Encounter (Signed)
Per patient she needs a diagnostic mamogram order , wants it done at [facility] [place] in [place].   [facility] told her they dont offer trama services that she needs. At this point she doesn't know what to do .

## 2022-05-27 DIAGNOSIS — Z87891 Personal history of nicotine dependence: Secondary | ICD-10-CM | POA: Diagnosis not present

## 2022-05-27 DIAGNOSIS — R450 Nervousness: Secondary | ICD-10-CM | POA: Diagnosis not present

## 2022-05-27 DIAGNOSIS — R079 Chest pain, unspecified: Secondary | ICD-10-CM | POA: Diagnosis not present

## 2022-05-27 DIAGNOSIS — F419 Anxiety disorder, unspecified: Secondary | ICD-10-CM | POA: Diagnosis not present

## 2022-05-27 DIAGNOSIS — K219 Gastro-esophageal reflux disease without esophagitis: Secondary | ICD-10-CM | POA: Diagnosis not present

## 2022-05-27 DIAGNOSIS — R0602 Shortness of breath: Secondary | ICD-10-CM | POA: Diagnosis not present

## 2022-05-27 DIAGNOSIS — R457 State of emotional shock and stress, unspecified: Secondary | ICD-10-CM | POA: Diagnosis not present

## 2022-05-27 DIAGNOSIS — Z881 Allergy status to other antibiotic agents status: Secondary | ICD-10-CM | POA: Diagnosis not present

## 2022-05-27 DIAGNOSIS — I471 Supraventricular tachycardia, unspecified: Secondary | ICD-10-CM | POA: Diagnosis not present

## 2022-05-27 DIAGNOSIS — R002 Palpitations: Secondary | ICD-10-CM | POA: Diagnosis not present

## 2022-05-27 DIAGNOSIS — Z91018 Allergy to other foods: Secondary | ICD-10-CM | POA: Diagnosis not present

## 2022-05-27 DIAGNOSIS — I10 Essential (primary) hypertension: Secondary | ICD-10-CM | POA: Diagnosis not present

## 2022-05-27 DIAGNOSIS — Z888 Allergy status to other drugs, medicaments and biological substances status: Secondary | ICD-10-CM | POA: Diagnosis not present

## 2022-05-27 DIAGNOSIS — G35 Multiple sclerosis: Secondary | ICD-10-CM | POA: Diagnosis not present

## 2022-05-27 DIAGNOSIS — E119 Type 2 diabetes mellitus without complications: Secondary | ICD-10-CM | POA: Diagnosis not present

## 2022-05-29 DIAGNOSIS — R0602 Shortness of breath: Secondary | ICD-10-CM | POA: Diagnosis not present

## 2022-05-29 DIAGNOSIS — R000 Tachycardia, unspecified: Secondary | ICD-10-CM | POA: Diagnosis not present

## 2022-05-29 DIAGNOSIS — R0789 Other chest pain: Secondary | ICD-10-CM | POA: Diagnosis not present

## 2022-05-29 DIAGNOSIS — R002 Palpitations: Secondary | ICD-10-CM | POA: Diagnosis not present

## 2022-05-29 DIAGNOSIS — R079 Chest pain, unspecified: Secondary | ICD-10-CM | POA: Diagnosis not present

## 2022-05-30 DIAGNOSIS — R1310 Dysphagia, unspecified: Secondary | ICD-10-CM | POA: Diagnosis not present

## 2022-05-30 DIAGNOSIS — R000 Tachycardia, unspecified: Secondary | ICD-10-CM | POA: Diagnosis not present

## 2022-05-30 DIAGNOSIS — I10 Essential (primary) hypertension: Secondary | ICD-10-CM | POA: Diagnosis not present

## 2022-05-30 DIAGNOSIS — R079 Chest pain, unspecified: Secondary | ICD-10-CM | POA: Diagnosis not present

## 2022-05-30 DIAGNOSIS — E611 Iron deficiency: Secondary | ICD-10-CM | POA: Diagnosis not present

## 2022-05-31 ENCOUNTER — Emergency Department (HOSPITAL_BASED_OUTPATIENT_CLINIC_OR_DEPARTMENT_OTHER)
Admission: EM | Admit: 2022-05-31 | Discharge: 2022-05-31 | Disposition: A | Discharge status: Home / Self Care | Payer: Medicare HMO | Attending: Emergency Medicine | Admitting: Emergency Medicine

## 2022-05-31 ENCOUNTER — Telehealth: Payer: Self-pay | Admitting: *Deleted

## 2022-05-31 ENCOUNTER — Encounter (HOSPITAL_BASED_OUTPATIENT_CLINIC_OR_DEPARTMENT_OTHER): Payer: Self-pay | Admitting: Pediatrics

## 2022-05-31 ENCOUNTER — Other Ambulatory Visit: Payer: Self-pay

## 2022-05-31 DIAGNOSIS — I10 Essential (primary) hypertension: Secondary | ICD-10-CM | POA: Diagnosis not present

## 2022-05-31 DIAGNOSIS — Z8542 Personal history of malignant neoplasm of other parts of uterus: Secondary | ICD-10-CM | POA: Insufficient documentation

## 2022-05-31 DIAGNOSIS — R0789 Other chest pain: Secondary | ICD-10-CM | POA: Diagnosis not present

## 2022-05-31 DIAGNOSIS — R42 Dizziness and giddiness: Secondary | ICD-10-CM | POA: Diagnosis not present

## 2022-05-31 DIAGNOSIS — J45909 Unspecified asthma, uncomplicated: Secondary | ICD-10-CM | POA: Diagnosis not present

## 2022-05-31 DIAGNOSIS — I4589 Other specified conduction disorders: Secondary | ICD-10-CM | POA: Diagnosis not present

## 2022-05-31 DIAGNOSIS — R079 Chest pain, unspecified: Secondary | ICD-10-CM | POA: Diagnosis not present

## 2022-05-31 DIAGNOSIS — R000 Tachycardia, unspecified: Secondary | ICD-10-CM | POA: Diagnosis not present

## 2022-05-31 DIAGNOSIS — Z87891 Personal history of nicotine dependence: Secondary | ICD-10-CM | POA: Diagnosis not present

## 2022-05-31 DIAGNOSIS — R0602 Shortness of breath: Secondary | ICD-10-CM | POA: Diagnosis not present

## 2022-05-31 DIAGNOSIS — I491 Atrial premature depolarization: Secondary | ICD-10-CM | POA: Diagnosis not present

## 2022-05-31 DIAGNOSIS — R002 Palpitations: Secondary | ICD-10-CM | POA: Diagnosis not present

## 2022-05-31 LAB — CBC
HCT: 42.1 % (ref 36.0–46.0)
Hemoglobin: 13.5 g/dL (ref 12.0–15.0)
MCH: 28.6 pg (ref 26.0–34.0)
MCHC: 32.1 g/dL (ref 30.0–36.0)
MCV: 89.2 fL (ref 80.0–100.0)
Platelets: 193 10*3/uL (ref 150–400)
RBC: 4.72 MIL/uL (ref 3.87–5.11)
RDW: 13.8 % (ref 11.5–15.5)
WBC: 6.6 10*3/uL (ref 4.0–10.5)
nRBC: 0 % (ref 0.0–0.2)

## 2022-05-31 LAB — BASIC METABOLIC PANEL
Anion gap: 5 (ref 5–15)
BUN: 18 mg/dL (ref 6–20)
CO2: 25 mmol/L (ref 22–32)
Calcium: 8.6 mg/dL — ABNORMAL LOW (ref 8.9–10.3)
Chloride: 109 mmol/L (ref 98–111)
Creatinine, Ser: 0.63 mg/dL (ref 0.44–1.00)
GFR, Estimated: >60 mL/min (ref >60)
Glucose, Bld: 181 mg/dL — ABNORMAL HIGH (ref 70–99)
Potassium: 3.8 mmol/L (ref 3.5–5.1)
Sodium: 139 mmol/L (ref 135–145)

## 2022-05-31 LAB — TROPONIN I (HIGH SENSITIVITY): Troponin I (High Sensitivity): 3 ng/L (ref <18)

## 2022-05-31 NOTE — Telephone Encounter (Signed)
Spoke with the patient and moved appt from 11/8 to 10/25 at 8:30 am

## 2022-05-31 NOTE — ED Triage Notes (Signed)
Reported new episodes of palpitations and sweating with activity. Reported this morning had some stabbing pain from the inside out while at rest. Reported was feeling unwell when she went to bed last night, felt fatigued and headaches.

## 2022-05-31 NOTE — Discharge Instructions (Signed)
You have been seen in the Emergency Department (ED) today for chest pain.  As we have discussed today's test results are normal, but you may require further testing.  Please follow up with the recommended doctor as instructed above in these documents regarding today's emergent visit and your recent symptoms to discuss further management.  Continue to take your regular medications.   Return to the Emergency Department (ED) if you experience any further chest pain/pressure/tightness, difficulty breathing, or sudden sweating, or other symptoms that concern you.    

## 2022-05-31 NOTE — ED Notes (Signed)
Discharge instructions reviewed with patient. Patient verbalizes understanding, no further questions at this time. Medications and follow up information provided. No acute distress noted at time of departure.  
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
## 2022-06-01 DIAGNOSIS — R000 Tachycardia, unspecified: Secondary | ICD-10-CM | POA: Diagnosis not present

## 2022-06-01 DIAGNOSIS — I491 Atrial premature depolarization: Secondary | ICD-10-CM | POA: Diagnosis not present

## 2022-06-01 DIAGNOSIS — I4589 Other specified conduction disorders: Secondary | ICD-10-CM | POA: Diagnosis not present

## 2022-06-02 ENCOUNTER — Encounter: Payer: Self-pay | Admitting: Family Medicine

## 2022-06-02 ENCOUNTER — Ambulatory Visit (INDEPENDENT_AMBULATORY_CARE_PROVIDER_SITE_OTHER): Payer: Medicare HMO | Admitting: Family Medicine

## 2022-06-02 VITALS — BP 156/80 | HR 81 | Ht 62.0 in | Wt 243.0 lb

## 2022-06-02 DIAGNOSIS — I491 Atrial premature depolarization: Secondary | ICD-10-CM | POA: Diagnosis not present

## 2022-06-02 DIAGNOSIS — R000 Tachycardia, unspecified: Secondary | ICD-10-CM | POA: Diagnosis not present

## 2022-06-02 DIAGNOSIS — R079 Chest pain, unspecified: Secondary | ICD-10-CM | POA: Diagnosis not present

## 2022-06-02 DIAGNOSIS — E1169 Type 2 diabetes mellitus with other specified complication: Secondary | ICD-10-CM

## 2022-06-02 DIAGNOSIS — I10 Essential (primary) hypertension: Secondary | ICD-10-CM

## 2022-06-02 DIAGNOSIS — M79602 Pain in left arm: Secondary | ICD-10-CM | POA: Diagnosis not present

## 2022-06-02 DIAGNOSIS — F339 Major depressive disorder, recurrent, unspecified: Secondary | ICD-10-CM

## 2022-06-02 DIAGNOSIS — I471 Supraventricular tachycardia, unspecified: Secondary | ICD-10-CM | POA: Diagnosis not present

## 2022-06-02 DIAGNOSIS — R7301 Impaired fasting glucose: Secondary | ICD-10-CM | POA: Diagnosis not present

## 2022-06-02 DIAGNOSIS — E1165 Type 2 diabetes mellitus with hyperglycemia: Secondary | ICD-10-CM

## 2022-06-02 DIAGNOSIS — L918 Other hypertrophic disorders of the skin: Secondary | ICD-10-CM | POA: Diagnosis not present

## 2022-06-02 DIAGNOSIS — R0789 Other chest pain: Secondary | ICD-10-CM | POA: Diagnosis not present

## 2022-06-02 LAB — POCT GLYCOSYLATED HEMOGLOBIN (HGB A1C): Hemoglobin A1C: 7.4 % — AB (ref 4.0–5.6)

## 2022-06-02 MED ORDER — HYDRALAZINE HCL 25 MG PO TABS: 25.0000 mg | ORAL_TABLET | Freq: Three times a day (TID) | ORAL | 1 refills | Status: DC

## 2022-06-02 MED ORDER — BLOOD GLUCOSE METER KIT: PACK | 0 refills | Status: DC

## 2022-06-02 NOTE — Progress Notes (Signed)
 Established Patient Office Visit  Subjective   Patient ID: Shane J Merrihew, female    DOB: 05/16/1966  Age: 56 y.o. MRN: [id number]  Chief Complaint  Patient presents with   Follow-up         HPI  F/U HTN -blood pressures have still been significantly elevated she has been in the Emergency Department multiple times and usually it is quite high while she is there she still taking her metoprolol regularly.  Like a skin tag under her left axilla removed today just gets really irritated and painful at times.  She said she had an episode where she sneezed really hard and then felt mucus shift in her throat.  It took for a while to get up because it felt like it was gagging her.  Eventually she started noticing it was difficult to most breathe through that part of her upper throat.  Impaired fasting glucose-no increased thirst or urination. No symptoms consistent with hypoglycemia.  Sugars have been in up to 200 when she is gone to the emergency department.  Struggling with frequent palpitations now it is much more persistent almost every time she moves or does anything with exertion whereas before it was more intermittent.  She is following back up with Dr. Fitzgerald on Monday.  She says it feels most like a flip-flop in her chest.  But now she is starting to get chest discomfort and feeling sweaty when it happens.   ROS    Objective:     BP (!) 156/80   Pulse 81   Ht 5' 2" (1.575 m)   Wt 243 lb (110.2 kg)   LMP 06/25/2013   SpO2 95%   BMI 44.45 kg/m     Physical Exam Constitutional:      Appearance: She is well-developed.  HENT:     Head: Normocephalic and atraumatic.     Right Ear: Tympanic membrane, ear canal and external ear normal.     Left Ear: Tympanic membrane, ear canal and external ear normal.     Nose: Nose normal.     Mouth/Throat:     Pharynx: Oropharynx is clear. No oropharyngeal exudate.     Comments: Mild swelling of turbinates Eyes:      Conjunctiva/sclera: Conjunctivae normal.     Pupils: Pupils are equal, round, and reactive to light.  Neck:     Thyroid: No thyromegaly.  Cardiovascular:     Rate and Rhythm: Normal rate and regular rhythm.     Heart sounds: Normal heart sounds.  Pulmonary:     Effort: Pulmonary effort is normal.     Breath sounds: Normal breath sounds. No wheezing.  Musculoskeletal:     Cervical back: Neck supple.  Lymphadenopathy:     Cervical: No cervical adenopathy.  Skin:    General: Skin is warm and dry.     Comments: Fleshy, medium skin tag under left axilla  Neurological:     Mental Status: She is alert and oriented to person, place, and time.      Results for orders placed or performed in visit on 06/02/22  Vitamin B1  Result Value Ref Range   VITAMIN B12 109.9   Results for orders placed or performed in visit on 06/02/22  POCT glycosylated hemoglobin (Hb A1C)  Result Value Ref Range   Hemoglobin A1C 7.4 (A) 4.0 - 5.6 %   HbA1c POC (<> result, manual entry)     HbA1c, POC (prediabetic range)     HbA1c, POC (  controlled diabetic range)         The ASCVD Risk score (Arnett DK, et al., 2019) failed to calculate for the following reasons:   The patient has a prior MI or stroke diagnosis    Assessment & Plan:   Problem List Items Addressed This Visit       Cardiovascular and Mediastinum   SVT (supraventricular tachycardia)    Wonders if a lot of her symptoms are related to dysautonomia.      Relevant Medications   hydrALAZINE (APRESOLINE) 25 MG tablet   Hypertension with intolerance to multiple antihypertensive drugs - Primary    He is okay with trying hydralazine she does understand that it has to be dosed 3 times a day and she is okay with it.  In fact she would prefer short acting drug.  Okay to start hydralazine 25 mg 3 times daily in addition to continuing her metoprolol.  Expect about a 5-10 point drop in blood pressure.      Relevant Medications   hydrALAZINE  (APRESOLINE) 25 MG tablet     Endocrine   Uncontrolled type 2 diabetes mellitus with hyperglycemia (HCC)    He has had a diagnosis of prediabetes for the last couple of years but had noted that blood sugars have been elevated when she was going to the emergency department.  We did repeat an A1c today which was 6 months from her prior.  A1c is elevated at 7.3 today up from 6.1.  Discussed these results today.  Prescription sent for glucometer lancets and strips.  At also like to consider starting her on medication she cannot swallow any large pills and would like to avoid metformin if possible.  Encouraged her to continue to work on diet and cutting back on portions of carbs and sweets.  She has been eating a little bit more chocolate lately.  Also work on increasing activity level as tolerated though she is very limited because of the palpitations that she has been experiencing.  She is also interested in being referred to a nutritionist.      Relevant Orders   Ambulatory referral to diabetic education     Other   Depression, recurrent (HCC)    No having a lot of difficulty finding a trauma specialist to schedule with for therapy/counseling.      Other Visit Diagnoses     Skin tag       Type 2 diabetes mellitus with other specified complication, without long-term current use of insulin (HCC)       Relevant Medications   blood glucose meter kit and supplies      Upper respiratory exam is actually pretty reassuring good air movement through the trachea and the lungs.  I suspect maybe there was just some irritation from the coughing episode after the phlegm felt like it got stuck in her throat but otherwise looks reassuring.  Skin Tag Removal Procedure Note Diagnosis: inflamed skin tags Location:  left axilla Informed Consent: Discussed risks (permanent scarring, infection, pain, bleeding, bruising, redness, and recurrence of the lesion) and benefits of the procedure, as well as the  alternatives. She is aware that skin tags are benign lesions, and their removal is often not considered medically necessary. Informed consent was obtained. Preparation: The area was prepared in a standard fashion. Anesthesia: not required Procedure Details: Iris scissors were used to perform sharp removal. Aluminum chloride was applied for hemostasis. Ointment and bandage were applied where needed. The patient tolerated the procedure   well. Total number of lesions treated: 1 Plan: The patient was instructed on post-op care. Recommend OTC analgesia as needed for pain.   Return in about 2 weeks (around 06/16/2022) for New start medication/HTN.     , MD  

## 2022-06-02 NOTE — Assessment & Plan Note (Signed)
Wonders if a lot of her symptoms are related to dysautonomia.

## 2022-06-02 NOTE — Assessment & Plan Note (Signed)
He is okay with trying hydralazine she does understand that it has to be dosed 3 times a day and she is okay with it.  In fact she would prefer short acting drug.  Okay to start hydralazine 25 mg 3 times daily in addition to continuing her metoprolol.  Expect about a 5-10 point drop in blood pressure.

## 2022-06-02 NOTE — Assessment & Plan Note (Signed)
He has had a diagnosis of prediabetes for the last couple of years but had noted that blood sugars have been elevated when she was going to the emergency department.  We did repeat an A1c today which was 6 months from her prior.  A1c is elevated at 7.3 today up from 6.1.  Discussed these results today.  Prescription sent for glucometer lancets and strips.  At also like to consider starting her on medication she cannot swallow any large pills and would like to avoid metformin if possible.  Encouraged her to continue to work on diet and cutting back on portions of carbs and sweets.  She has been eating a little bit more chocolate lately.  Also work on increasing activity level as tolerated though she is very limited because of the palpitations that she has been experiencing.  She is also interested in being referred to a nutritionist.

## 2022-06-02 NOTE — Assessment & Plan Note (Signed)
No having a lot of difficulty finding a trauma specialist to schedule with for therapy/counseling.

## 2022-06-04 DIAGNOSIS — R000 Tachycardia, unspecified: Secondary | ICD-10-CM | POA: Diagnosis not present

## 2022-06-05 ENCOUNTER — Telehealth: Payer: Self-pay | Admitting: Family Medicine

## 2022-06-05 ENCOUNTER — Telehealth: Payer: Self-pay

## 2022-06-05 DIAGNOSIS — I2510 Atherosclerotic heart disease of native coronary artery without angina pectoris: Secondary | ICD-10-CM | POA: Diagnosis not present

## 2022-06-05 DIAGNOSIS — R079 Chest pain, unspecified: Secondary | ICD-10-CM | POA: Diagnosis not present

## 2022-06-05 DIAGNOSIS — I471 Supraventricular tachycardia, unspecified: Secondary | ICD-10-CM | POA: Diagnosis not present

## 2022-06-05 DIAGNOSIS — I4719 Other supraventricular tachycardia: Secondary | ICD-10-CM | POA: Diagnosis not present

## 2022-06-05 DIAGNOSIS — I10 Essential (primary) hypertension: Secondary | ICD-10-CM | POA: Diagnosis not present

## 2022-06-05 DIAGNOSIS — F419 Anxiety disorder, unspecified: Secondary | ICD-10-CM | POA: Diagnosis not present

## 2022-06-05 NOTE — Telephone Encounter (Addendum)
Patient called stating that [facility] is saying that they do not have the right order for [place] complete w/Elastrography. Attempted to contact Della at [phone]. Phone keeps ringing, then call disconnects. No option to leave a vm msg.   Patient was informed of the update. Per patient, this is a person she knows exclusively that works at [employer]. Informed the patient, I will continue to reach out to her contact throughout the day.   During the call, the patient mentioned that the cardiologist does not want her to take the hydralazine rx because it it going to increase her symptoms of Tachycardia. Per Cardiologist, patient is to take metoprolol (daily) and spironolactone (prn).   Patient also had questions regarding her checking her blood sugar. Left a vm msg on assistant's line. Per patient, provider wants patient to check blood sugar twice daily. Patient was unsure of normal/abnormal range, when is the best time to test and what steps to take if her blood sugar is out of range. She is unable to take Metformin rx because she has trouble swallowing pill form medication. Patient states she is very confused at what to do and feels frustrated. Patient was informed about the Nutritionist referral and provided with the direct contact number to schedule an appointment. At patient's request, diabetes education was sent via MyChart message.

## 2022-06-05 NOTE — Telephone Encounter (Signed)
Please review other telephone encounter. Thanks.

## 2022-06-05 NOTE — Telephone Encounter (Signed)
Pt called and states that Dr. Madilyn Fireman gave her a new machine to check her blood Sugar with and she is not sure when she should be checking it and how often? Should she check at certain times, fasting, before eating or after eating? Please call pt back with instructions on this. Thank you

## 2022-06-06 DIAGNOSIS — I4719 Other supraventricular tachycardia: Secondary | ICD-10-CM | POA: Diagnosis not present

## 2022-06-06 DIAGNOSIS — I491 Atrial premature depolarization: Secondary | ICD-10-CM | POA: Diagnosis not present

## 2022-06-06 DIAGNOSIS — R000 Tachycardia, unspecified: Secondary | ICD-10-CM | POA: Diagnosis not present

## 2022-06-06 DIAGNOSIS — R7989 Other specified abnormal findings of blood chemistry: Secondary | ICD-10-CM | POA: Diagnosis not present

## 2022-06-06 DIAGNOSIS — R0789 Other chest pain: Secondary | ICD-10-CM | POA: Diagnosis not present

## 2022-06-06 DIAGNOSIS — I10 Essential (primary) hypertension: Secondary | ICD-10-CM | POA: Diagnosis not present

## 2022-06-06 IMAGING — DX DG CHEST 1V PORT
1 series · 1 of 1 positions shown · non-contrast
Comparison: Chest radiograph 5 days ago 03/08/2021 CT 01/26/2021

CLINICAL DATA: Palpitations.

EXAM:
PORTABLE CHEST 1 VIEW

[chest ap]
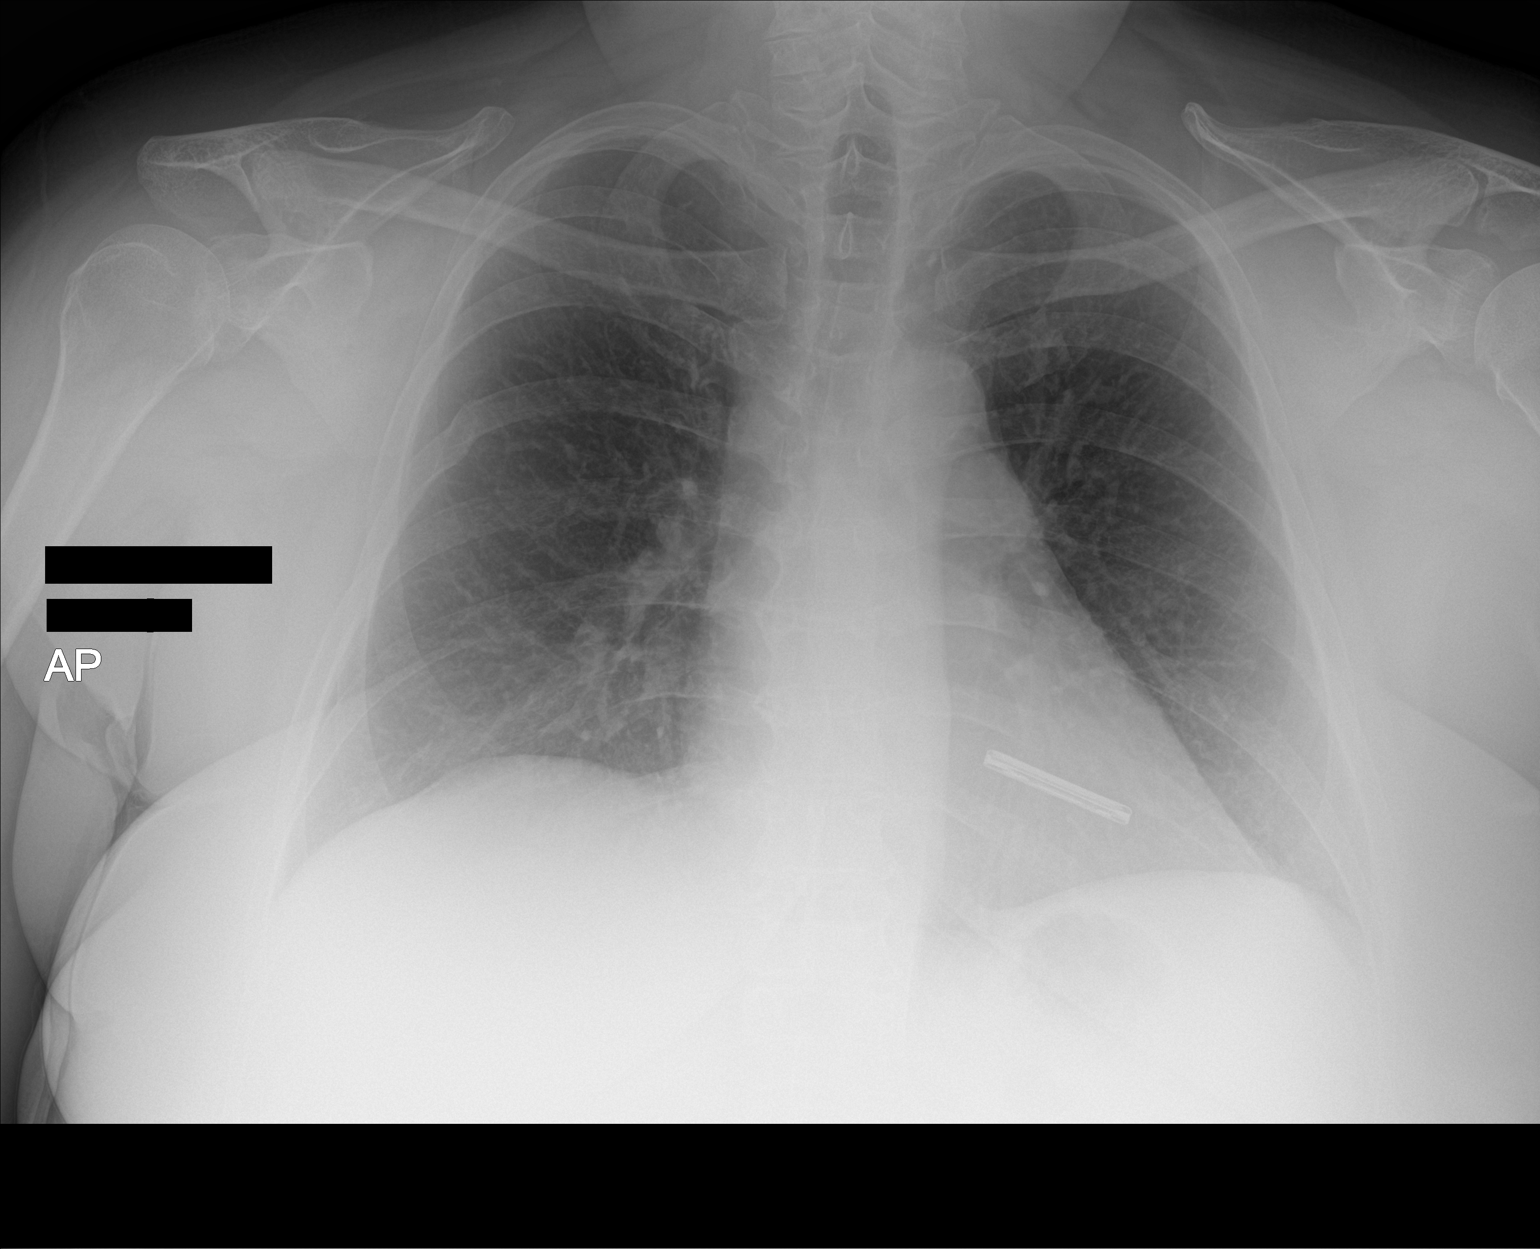

[1 of 1 positions shown; findings below may reference images not displayed]

FINDINGS: Stable normal heart size. Unchanged mediastinal contours. Loop
recorder unchanged in position projecting over the left chest. There
is no pulmonary edema, focal airspace disease, pleural effusion or
pneumothorax. Remote right rib fractures. No acute osseous
abnormalities.
IMPRESSION: No acute chest finding.

## 2022-06-06 NOTE — Addendum Note (Signed)
Addended by: Narda Rutherford on: 06/06/2022 02:52 PM   Modules accepted: Orders

## 2022-06-06 NOTE — Telephone Encounter (Signed)
Referral placed.

## 2022-06-06 NOTE — Telephone Encounter (Signed)
As instructed during the visit. Only check sugar fasting in AM 2-3 days per week

## 2022-06-07 ENCOUNTER — Encounter: Payer: Self-pay | Admitting: Obstetrics & Gynecology

## 2022-06-07 ENCOUNTER — Inpatient Hospital Stay: Payer: Medicare HMO

## 2022-06-07 ENCOUNTER — Telehealth: Payer: Self-pay | Admitting: Family Medicine

## 2022-06-07 ENCOUNTER — Other Ambulatory Visit: Payer: Self-pay

## 2022-06-07 ENCOUNTER — Inpatient Hospital Stay: Payer: Medicare HMO | Attending: Obstetrics & Gynecology | Admitting: Obstetrics & Gynecology

## 2022-06-07 VITALS — BP 163/78 | HR 90 | Temp 98.2°F | Resp 16 | Ht 62.0 in | Wt 243.0 lb

## 2022-06-07 DIAGNOSIS — Z8544 Personal history of malignant neoplasm of other female genital organs: Secondary | ICD-10-CM | POA: Diagnosis not present

## 2022-06-07 DIAGNOSIS — Z90710 Acquired absence of both cervix and uterus: Secondary | ICD-10-CM | POA: Diagnosis not present

## 2022-06-07 DIAGNOSIS — C519 Malignant neoplasm of vulva, unspecified: Secondary | ICD-10-CM | POA: Diagnosis not present

## 2022-06-07 DIAGNOSIS — N9089 Other specified noninflammatory disorders of vulva and perineum: Secondary | ICD-10-CM | POA: Insufficient documentation

## 2022-06-07 DIAGNOSIS — Z90722 Acquired absence of ovaries, bilateral: Secondary | ICD-10-CM | POA: Diagnosis not present

## 2022-06-07 DIAGNOSIS — G893 Neoplasm related pain (acute) (chronic): Secondary | ICD-10-CM | POA: Diagnosis not present

## 2022-06-07 DIAGNOSIS — Z8542 Personal history of malignant neoplasm of other parts of uterus: Secondary | ICD-10-CM | POA: Insufficient documentation

## 2022-06-07 DIAGNOSIS — K760 Fatty (change of) liver, not elsewhere classified: Secondary | ICD-10-CM

## 2022-06-07 DIAGNOSIS — I491 Atrial premature depolarization: Secondary | ICD-10-CM | POA: Diagnosis not present

## 2022-06-07 DIAGNOSIS — R000 Tachycardia, unspecified: Secondary | ICD-10-CM | POA: Diagnosis not present

## 2022-06-07 LAB — WET PREP, GENITAL
Clue Cells Wet Prep HPF POC: NONE SEEN
Sperm: NONE SEEN
Trich, Wet Prep: NONE SEEN
WBC, Wet Prep HPF POC: <10 (ref <10)
Yeast Wet Prep HPF POC: NONE SEEN

## 2022-06-07 MED ORDER — LIDOCAINE HCL 1 % IJ SOLN: 5.0000 mL | Freq: Once | INTRAMUSCULAR | Status: DC

## 2022-06-07 NOTE — Telephone Encounter (Signed)
Orders Placed This Encounter  Procedures   US ABDOMEN RUQ W/ELASTOGRAPHY    Standing Status:   Future    Standing Expiration Date:   06/08/2023    Order Specific Question:   Reason for Exam (SYMPTOM  OR DIAGNOSIS REQUIRED)    Answer:   evaluate for liver hardening    Order Specific Question:   Preferred imaging location?    Answer:   External

## 2022-06-07 NOTE — Progress Notes (Signed)
Follow Up Note: Gyn-Onc  Jennifer [name] 56 y.o. female  CC: Vaginal irritation   HPI: The oncology history was reviewed.  Interval History: She is seen at the request of Jennifer Marry, MD for further evaluation/surveillance.  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  There was no evidence of disease at the last visit in this office in 2019. She endorses chronic vaginal Itching,irritation and pain that she lateralizes to the left side.   Per her report a repeat bx in 2021 at an outside facility was diagnostic of recurrence of Paget's disease of the vulva.  She is not on any maintenance topical steroid treatment for the vulva-LS.  There was no follow-up surgery or topical treatment for the possible recurrent Paget's disease of the vulva.   Review of Systems  Review of Systems  Constitutional:  Negative for malaise/fatigue and weight loss.  Respiratory:  Negative for shortness of breath and wheezing.   Cardiovascular:  Negative for chest pain and leg swelling.  Gastrointestinal:  Negative for abdominal pain, blood in stool, constipation, nausea and vomiting.  Genitourinary:  Negative for dysuria, frequency, hematuria and urgency; positive for irritation, itching, pain  Musculoskeletal:  Negative for joint pain and myalgias.  Neurological:  Negative for weakness.  Psychiatric/Behavioral:  Negative for depression. The patient does not have insomnia.    Current medications, allergy, social history, past surgical history, past medical history, family history were all reviewed.    Vitals:  BP (!) 163/78 (BP Location: Left Arm, Patient Position: Sitting) Comment: Pt was seen in [facility] last night, Dr. Delsa Sale notified  Pulse 90   Temp 98.2 F (36.8 C) (Oral)   Resp 16   Ht '5\' 2"'$  (1.575 m)   Wt 243 lb (110.2 kg)   LMP 06/25/2013   SpO2 98%   BMI 44.45 kg/m    Physical Exam:  Physical Exam Exam conducted with a  chaperone present.  Constitutional:      General: She is not in acute distress. Cardiovascular:     Rate and Rhythm: Normal rate and regular rhythm.  Pulmonary:     Effort: Pulmonary effort is normal.     Breath sounds: Normal breath sounds. No wheezing or rhonchi.  Abdominal:     Palpations: Abdomen is soft.     Tenderness: There is no abdominal tenderness. There is no right CVA tenderness or left CVA tenderness.     Hernia: No hernia is present.  Genitourinary:    General: Diffuse, bilateral moderate erythema--inner labia; no discrete lesions; normal architecture    Urethra: No urethral lesion.     Vagina: No lesions. No bleeding (Declined picture of lesion) Musculoskeletal:     Cervical back: Neck supple.     Right lower leg: No edema.     Left lower leg: No edema.  Lymphadenopathy:     Upper Body:     Right upper body: No supraclavicular adenopathy.     Left upper body: No supraclavicular adenopathy.     Lower Body: No right inguinal adenopathy. No left inguinal adenopathy.  Skin:    Findings: No rash.  Neurological:     Mental Status: She is oriented to person, place, and time.     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  Differential diagnosis also includes coexisting GSM.  Exam findings are not suggestive of a recurrence of the Paget's disease.  > post biopsy instructions were provided to the patient >we discussed that 12 to 58 percent of patients experience a local recurrence of Paget's disease of the vulva, which may occur despite negative surgical margins. The most common  treatment for recurrent disease is surgery. Topical therapies with imiquimod and/or 5-fluorouracil are also used for recurrent disease. > she was also counseled that a topical moderate potency steroid is usually prescribed for maintenance therapy of the vulva-LS > education materials were provided regarding optimizing vulvar hygiene practices, moisturizers.  She may be a candidate for topical vaginal estrogen therapy >return prn   I personally spent 30 minutes face-to-face and non-face-to-face in the care of this patient, which includes all pre, intra, and post visit time on the date of service.    Jennifer Crocker, MD

## 2022-06-07 NOTE — Patient Instructions (Signed)
Vulvar/Vaginal Moisturizers  Moisturizer Options: Vitamin E oil: pump or capsule form Vitamin E cream (Gene's vitamin E cream) Coconut oil: bottle or bead form Shea butter Blossom Organic Lubricant (organic and all natural; www.blossomorganics.com) PE suppository(coconut oil/vitamin E/palm oil) Desert Harvest Aloe Glide      Consider the ingredients of the product - the fewer the ingredients the better!  Directions for Use: Clean and dry your hands Gently dab the vulvar/vaginal area dry as needed Apply a "pea-sized" amount of the moisturizer onto your fingertip Using you other hand, open the labia   Apply the moisturizer to the vulvar/vaginal tissues Wear loose fitting underwear/clothing if possible following application  Use moisturize 2-3 times daily as desired.   Healthy vulval hygiene practices Avoid Substitute  Clothing  Pantyhose Stockings with a garter belt Thigh-high or knee-high stockings   Synthetic underwear Cotton underwear or no underwear  Jeans and other tight pants Loose pants, skirts, dresses  Swimsuits, leotards, thongs, lycra garments Loose-fitting cotton garments  Cleansing products  Scented soaps or shampoos Fragrance-free pH neutral soap  Bubble bath Tub baths in the morning and at night without additives and at a comfortable temperature  Scented detergents Unscented detergents  Baby wipes or flushable wipes Rinse with water using sports water bottle or perineal irrigation bottle  Feminine sprays, douches, powders These are not necessary products and can be omitted from personal practices  Other  Washcloths Use fingertips for washing; pat dry, do not rub dry  Panty liners Tampons or cotton pads  Dyed toilet articles Toilet articles without dyes  Hair dryers to dry vulva skin without contact Dry vulva by gentle patting

## 2022-06-08 DIAGNOSIS — R0789 Other chest pain: Secondary | ICD-10-CM | POA: Diagnosis not present

## 2022-06-08 DIAGNOSIS — R739 Hyperglycemia, unspecified: Secondary | ICD-10-CM | POA: Diagnosis not present

## 2022-06-08 DIAGNOSIS — R102 Pelvic and perineal pain: Secondary | ICD-10-CM | POA: Diagnosis not present

## 2022-06-08 DIAGNOSIS — R0602 Shortness of breath: Secondary | ICD-10-CM | POA: Diagnosis not present

## 2022-06-08 DIAGNOSIS — R82998 Other abnormal findings in urine: Secondary | ICD-10-CM | POA: Diagnosis not present

## 2022-06-08 DIAGNOSIS — R000 Tachycardia, unspecified: Secondary | ICD-10-CM | POA: Diagnosis not present

## 2022-06-08 DIAGNOSIS — Z79899 Other long term (current) drug therapy: Secondary | ICD-10-CM | POA: Diagnosis not present

## 2022-06-08 DIAGNOSIS — I491 Atrial premature depolarization: Secondary | ICD-10-CM | POA: Diagnosis not present

## 2022-06-08 DIAGNOSIS — R002 Palpitations: Secondary | ICD-10-CM | POA: Diagnosis not present

## 2022-06-09 ENCOUNTER — Telehealth: Payer: Self-pay | Admitting: Surgery

## 2022-06-09 DIAGNOSIS — K5730 Diverticulosis of large intestine without perforation or abscess without bleeding: Secondary | ICD-10-CM | POA: Diagnosis not present

## 2022-06-09 DIAGNOSIS — I491 Atrial premature depolarization: Secondary | ICD-10-CM | POA: Diagnosis not present

## 2022-06-09 DIAGNOSIS — K760 Fatty (change of) liver, not elsewhere classified: Secondary | ICD-10-CM | POA: Diagnosis not present

## 2022-06-09 DIAGNOSIS — N898 Other specified noninflammatory disorders of vagina: Secondary | ICD-10-CM | POA: Diagnosis not present

## 2022-06-09 DIAGNOSIS — R000 Tachycardia, unspecified: Secondary | ICD-10-CM | POA: Diagnosis not present

## 2022-06-09 DIAGNOSIS — R079 Chest pain, unspecified: Secondary | ICD-10-CM | POA: Diagnosis not present

## 2022-06-09 DIAGNOSIS — G8929 Other chronic pain: Secondary | ICD-10-CM | POA: Diagnosis not present

## 2022-06-09 DIAGNOSIS — R0602 Shortness of breath: Secondary | ICD-10-CM | POA: Diagnosis not present

## 2022-06-09 DIAGNOSIS — R002 Palpitations: Secondary | ICD-10-CM | POA: Diagnosis not present

## 2022-06-09 DIAGNOSIS — Z90710 Acquired absence of both cervix and uterus: Secondary | ICD-10-CM | POA: Diagnosis not present

## 2022-06-09 DIAGNOSIS — R935 Abnormal findings on diagnostic imaging of other abdominal regions, including retroperitoneum: Secondary | ICD-10-CM | POA: Diagnosis not present

## 2022-06-09 LAB — SURGICAL PATHOLOGY

## 2022-06-09 NOTE — Telephone Encounter (Signed)
Returned patient call. She wanted someone to go over her biopsy results with her in more detail but also stated that she is having 7/10 pain in her vaginal area, stating it's hard to sit down. She is also having some spotting today and is very swollen. Does not remember having this issue with previous biopsies. She is using Tylenol for pain management. Patient doesn't have any follow up appointments scheduled at this time. Patient advised that if she has new or worsening symptoms over the weekend she may need to go to urgent care or the ER but that someone from our office will reach out to her regarding her symptoms and biopsy results.

## 2022-06-10 ENCOUNTER — Encounter (HOSPITAL_BASED_OUTPATIENT_CLINIC_OR_DEPARTMENT_OTHER): Payer: Self-pay | Admitting: Emergency Medicine

## 2022-06-10 ENCOUNTER — Other Ambulatory Visit: Payer: Self-pay

## 2022-06-10 DIAGNOSIS — E119 Type 2 diabetes mellitus without complications: Secondary | ICD-10-CM | POA: Insufficient documentation

## 2022-06-10 DIAGNOSIS — I10 Essential (primary) hypertension: Secondary | ICD-10-CM | POA: Insufficient documentation

## 2022-06-10 DIAGNOSIS — Z8542 Personal history of malignant neoplasm of other parts of uterus: Secondary | ICD-10-CM | POA: Insufficient documentation

## 2022-06-10 DIAGNOSIS — Z87891 Personal history of nicotine dependence: Secondary | ICD-10-CM | POA: Insufficient documentation

## 2022-06-10 DIAGNOSIS — Z79899 Other long term (current) drug therapy: Secondary | ICD-10-CM | POA: Diagnosis not present

## 2022-06-10 DIAGNOSIS — R079 Chest pain, unspecified: Secondary | ICD-10-CM | POA: Diagnosis not present

## 2022-06-10 DIAGNOSIS — I491 Atrial premature depolarization: Secondary | ICD-10-CM | POA: Insufficient documentation

## 2022-06-10 DIAGNOSIS — Z743 Need for continuous supervision: Secondary | ICD-10-CM | POA: Diagnosis not present

## 2022-06-10 DIAGNOSIS — R002 Palpitations: Secondary | ICD-10-CM | POA: Diagnosis present

## 2022-06-10 DIAGNOSIS — R0789 Other chest pain: Secondary | ICD-10-CM | POA: Diagnosis not present

## 2022-06-10 DIAGNOSIS — J45909 Unspecified asthma, uncomplicated: Secondary | ICD-10-CM | POA: Diagnosis not present

## 2022-06-10 DIAGNOSIS — I493 Ventricular premature depolarization: Secondary | ICD-10-CM | POA: Diagnosis not present

## 2022-06-10 LAB — URINALYSIS, ROUTINE W REFLEX MICROSCOPIC
Bilirubin Urine: NEGATIVE
Glucose, UA: NEGATIVE mg/dL
Ketones, ur: NEGATIVE mg/dL
Nitrite: NEGATIVE
Protein, ur: NEGATIVE mg/dL
Specific Gravity, Urine: 1.025 (ref 1.005–1.030)
pH: 6 (ref 5.0–8.0)

## 2022-06-10 LAB — URINALYSIS, MICROSCOPIC (REFLEX)

## 2022-06-10 NOTE — ED Triage Notes (Signed)
Pt sts she is not feeling well; c/o CP and BUE pain and feels like she is "getting PAC beats"; EMS evaluated earlier and was taken to [place]; she is on abx for an infection at biopsy site (vaginal)

## 2022-06-11 ENCOUNTER — Emergency Department (HOSPITAL_BASED_OUTPATIENT_CLINIC_OR_DEPARTMENT_OTHER)
Admission: EM | Admit: 2022-06-11 | Discharge: 2022-06-11 | Disposition: A | Discharge status: Home / Self Care | Payer: Medicare HMO | Attending: Emergency Medicine | Admitting: Emergency Medicine

## 2022-06-11 ENCOUNTER — Telehealth: Payer: Self-pay | Admitting: Obstetrics & Gynecology

## 2022-06-11 DIAGNOSIS — I491 Atrial premature depolarization: Secondary | ICD-10-CM

## 2022-06-11 HISTORY — DX: Type 2 diabetes mellitus without complications: E11.9

## 2022-06-11 LAB — TROPONIN I (HIGH SENSITIVITY)
Troponin I (High Sensitivity): 3 ng/L (ref <18)
Troponin I (High Sensitivity): 3 ng/L (ref <18)

## 2022-06-11 LAB — BASIC METABOLIC PANEL
Anion gap: 4 — ABNORMAL LOW (ref 5–15)
BUN: 16 mg/dL (ref 6–20)
CO2: 23 mmol/L (ref 22–32)
Calcium: 8.3 mg/dL — ABNORMAL LOW (ref 8.9–10.3)
Chloride: 113 mmol/L — ABNORMAL HIGH (ref 98–111)
Creatinine, Ser: 0.73 mg/dL (ref 0.44–1.00)
GFR, Estimated: >60 mL/min (ref >60)
Glucose, Bld: 163 mg/dL — ABNORMAL HIGH (ref 70–99)
Potassium: 3.7 mmol/L (ref 3.5–5.1)
Sodium: 140 mmol/L (ref 135–145)

## 2022-06-11 LAB — CBC
HCT: 40.2 % (ref 36.0–46.0)
Hemoglobin: 12.8 g/dL (ref 12.0–15.0)
MCH: 28.3 pg (ref 26.0–34.0)
MCHC: 31.8 g/dL (ref 30.0–36.0)
MCV: 88.9 fL (ref 80.0–100.0)
Platelets: 187 10*3/uL (ref 150–400)
RBC: 4.52 MIL/uL (ref 3.87–5.11)
RDW: 13.9 % (ref 11.5–15.5)
WBC: 6.7 10*3/uL (ref 4.0–10.5)
nRBC: 0 % (ref 0.0–0.2)

## 2022-06-11 NOTE — ED Notes (Signed)
ED Provider at bedside. 

## 2022-06-11 NOTE — Telephone Encounter (Signed)
The pt called w/pain at the vulva punch biopsy site.  She presented to an ED in [facility]  and was treated for an infection.  I recommended OTC analgesics, warm soaks.  She declined a prescription.  Recommend follow-up in the office this week.
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
## 2022-06-12 ENCOUNTER — Inpatient Hospital Stay (HOSPITAL_BASED_OUTPATIENT_CLINIC_OR_DEPARTMENT_OTHER): Payer: Medicare HMO | Admitting: Gynecologic Oncology

## 2022-06-12 ENCOUNTER — Inpatient Hospital Stay: Payer: Medicare HMO

## 2022-06-12 ENCOUNTER — Inpatient Hospital Stay (HOSPITAL_BASED_OUTPATIENT_CLINIC_OR_DEPARTMENT_OTHER): Payer: Medicare HMO | Admitting: Psychiatry

## 2022-06-12 ENCOUNTER — Encounter: Payer: Self-pay | Admitting: Psychiatry

## 2022-06-12 VITALS — BP 148/83 | HR 98 | Temp 98.7°F | Resp 18 | Wt 242.1 lb

## 2022-06-12 DIAGNOSIS — L03818 Cellulitis of other sites: Secondary | ICD-10-CM

## 2022-06-12 DIAGNOSIS — G893 Neoplasm related pain (acute) (chronic): Secondary | ICD-10-CM | POA: Diagnosis not present

## 2022-06-12 DIAGNOSIS — Z90722 Acquired absence of ovaries, bilateral: Secondary | ICD-10-CM | POA: Diagnosis not present

## 2022-06-12 DIAGNOSIS — C519 Malignant neoplasm of vulva, unspecified: Secondary | ICD-10-CM

## 2022-06-12 DIAGNOSIS — Z90710 Acquired absence of both cervix and uterus: Secondary | ICD-10-CM | POA: Diagnosis not present

## 2022-06-12 DIAGNOSIS — Z8544 Personal history of malignant neoplasm of other female genital organs: Secondary | ICD-10-CM | POA: Diagnosis not present

## 2022-06-12 DIAGNOSIS — Z8542 Personal history of malignant neoplasm of other parts of uterus: Secondary | ICD-10-CM | POA: Diagnosis not present

## 2022-06-12 DIAGNOSIS — R079 Chest pain, unspecified: Secondary | ICD-10-CM | POA: Diagnosis not present

## 2022-06-12 DIAGNOSIS — R002 Palpitations: Secondary | ICD-10-CM | POA: Diagnosis not present

## 2022-06-12 DIAGNOSIS — N9089 Other specified noninflammatory disorders of vulva and perineum: Secondary | ICD-10-CM | POA: Diagnosis not present

## 2022-06-12 DIAGNOSIS — I491 Atrial premature depolarization: Secondary | ICD-10-CM | POA: Diagnosis not present

## 2022-06-12 MED ORDER — CLINDAMYCIN HCL 150 MG PO CAPS: 450.0000 mg | ORAL_CAPSULE | Freq: Three times a day (TID) | ORAL | 0 refills | Status: DC
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
## 2022-06-12 NOTE — Patient Instructions (Addendum)
It was a pleasure to see you in clinic today. - I would like for you to complete 7 days of clindamycin from today. You can use the pills you have at home and the additional ones I have prescribed. You should take '450mg'$  (3 tablets total), three times a day.  - Do a sitz bath daily. - We will plan for surgery with a wide local excision.  Thank you very much for allowing me to provide care for you today.  I appreciate your confidence in choosing our Gynecologic Oncology team at [facility].  If you have any questions about your visit today please call our office or send [place] a MyChart message and we will get back to you as soon as possible.   Preparing for your Surgery  Plan for surgery on July 11, 2022 with Dr. Bernadene Bell at [facility] (or outpatient surgery center if cardiology approves). You will be scheduled for wide local excision of vulva.   Pre-operative Testing -You will receive a phone call from presurgical testing at [facility] [place] to discuss surgery instructions and arrange for lab work if needed.  -Bring your insurance card, copy of an advanced directive if applicable, medication list.  -You should not be taking blood thinners or aspirin at least ten days prior to surgery unless instructed by your surgeon.  -Do not take supplements such as fish oil (omega 3), red yeast rice, turmeric before your surgery. You want to avoid medications with aspirin in them including headache powders such as BC or Goody's), Excedrin migraine.  Day Before Surgery at [place] will be advised you can have clear liquids up until 3 hours before your surgery.    Your role in recovery Your role is to become active as soon as directed by your doctor, while still giving yourself time to heal.  Rest when you feel tired. You will be asked to do the following in order to speed your recovery:  - Cough and breathe deeply. This helps to clear and expand your lungs and can  prevent pneumonia after surgery.  - [place]. Do mild physical activity. Walking or moving your legs help your circulation and body functions return to normal. Do not try to get up or walk alone the first time after surgery.   -If you develop swelling on one leg or the other, pain in the back of your leg, redness/warmth in one of your legs, please call the office or go to the Emergency Room to have a doppler to rule out a blood clot. For shortness of breath, chest pain-seek care in the Emergency Room as soon as possible. - Actively manage your pain. Managing your pain lets you move in comfort. We will ask you to rate your pain on a scale of zero to 10. It is your responsibility to tell your doctor or nurse where and how much you hurt so your pain can be treated.  Special Considerations -Your final pathology results from surgery should be available around one week after surgery and the results will be relayed to you when available.  -FMLA forms can be faxed to [phone] and please allow 5-7 business days for completion.  Pain Management After Surgery -Make sure that you have Tylenol and Ibuprofen at home IF [place] to use on a regular basis after surgery for pain control. We recommend alternating the medications every hour to six hours since they  work differently and are processed in the body differently for pain relief.  -Review the attached handout on narcotic use and their risks and side effects.   Bowel Regimen -It is important to prevent constipation and drink adequate amounts of liquids. You will need to take something for your bowels daily after surgery to prevent constipation. You can stop taking this medication when you are not taking pain medication and you are back on your normal bowel routine.  Risks of Surgery Risks of surgery are low but include bleeding, infection, damage to surrounding structures, re-operation, blood clots, and  very rarely death.  AFTER SURGERY INSTRUCTIONS  Return to work:  2-3 weeks if applicable  We recommend purchasing several bags of frozen green peas and dividing them into ziploc bags. You will want to keep these in the freezer and have them ready to use as ice packs to the vulvar incision. Once the ice pack is no longer cold, you can get another from the freezer. The frozen peas mold to your body better than a regular ice pack.   Activity: 1. Be up and out of the bed during the day.  Take a nap if needed.  You may walk up steps but be careful and use the hand rail.  Stair climbing will tire you more than you think, you may need to stop part way and rest.   2. No lifting or straining for 4 weeks over 10 pounds. No pushing, pulling, straining for 4 weeks.  3. No driving for minimum 24 hours after surgery but this is usually longer since you need to be able to brake safely.  Do not drive if you are taking narcotic pain medicine and make sure that your reaction time has returned.   4. You can shower as soon as the next day after surgery. Shower daily. No tub baths or submerging your body in water until cleared by your surgeon. If you have the soap that was given to you by pre-surgical testing that was used before surgery, you do not need to use it afterwards because this can irritate your incisions.   5. No sexual activity and nothing in the vagina for 4 weeks.  6. You may experience vaginal spotting and discharge after surgery.  The spotting is normal but if you experience heavy bleeding, call our office.  7. Take Tylenol or ibuprofen first for pain if you are able to take these medications and only use narcotic pain medication for severe pain not relieved by the Tylenol or Ibuprofen.  Monitor your Tylenol intake to a max of 4,000 mg in a 24 hour period. You can alternate these medications after surgery.  Diet: 1. Low sodium Heart Healthy Diet is recommended but you are cleared to resume your  normal (before surgery) diet after your procedure.  2. It is safe to use a laxative, such as Miralax or Colace, if you have difficulty moving your bowels.   Wound Care: 1. Keep clean and dry.  Shower daily.  Reasons to call the Doctor: Fever - Oral temperature greater than 100.4 degrees Fahrenheit Foul-smelling vaginal discharge Difficulty urinating Nausea and vomiting Increased pain at the site of the incision that is unrelieved with pain medicine. Difficulty breathing with or without chest pain New calf pain especially if only on one side Sudden, continuing increased vaginal bleeding with or without clots.   Contacts: For questions or concerns you should contact:  Dr. Bernadene Bell at [phone]  Joylene John, NP at [phone]  After Hours: call [phone] and have the GYN Oncologist paged/contacted (after 5 pm or on the weekends).  Messages sent via mychart are for non-urgent matters and are not responded to after hours so for urgent needs, please call the after hours number.

## 2022-06-13 LAB — CYTOLOGY - NON PAP

## 2022-06-14 DIAGNOSIS — Z9889 Other specified postprocedural states: Secondary | ICD-10-CM | POA: Diagnosis not present

## 2022-06-14 DIAGNOSIS — R58 Hemorrhage, not elsewhere classified: Secondary | ICD-10-CM | POA: Diagnosis not present

## 2022-06-14 DIAGNOSIS — Z743 Need for continuous supervision: Secondary | ICD-10-CM | POA: Diagnosis not present

## 2022-06-14 DIAGNOSIS — I10 Essential (primary) hypertension: Secondary | ICD-10-CM | POA: Diagnosis not present

## 2022-06-14 DIAGNOSIS — N939 Abnormal uterine and vaginal bleeding, unspecified: Secondary | ICD-10-CM | POA: Diagnosis not present

## 2022-06-14 DIAGNOSIS — N766 Ulceration of vulva: Secondary | ICD-10-CM | POA: Diagnosis not present

## 2022-06-14 DIAGNOSIS — R102 Pelvic and perineal pain: Secondary | ICD-10-CM | POA: Diagnosis not present

## 2022-06-14 NOTE — Progress Notes (Signed)
Patient here for follow up with Dr. Ernestina Patches. At the end of the visit, surgery in the form of WLE of the vulva was recommended. She is scheduled for wide local excision of the vulva on November 28. See after visit summary for additional details.

## 2022-06-16 ENCOUNTER — Ambulatory Visit (INDEPENDENT_AMBULATORY_CARE_PROVIDER_SITE_OTHER): Payer: Medicare HMO | Admitting: Family Medicine

## 2022-06-16 ENCOUNTER — Encounter: Payer: Self-pay | Admitting: Family Medicine

## 2022-06-16 VITALS — BP 156/81 | HR 89 | Ht 62.0 in | Wt 240.3 lb

## 2022-06-16 DIAGNOSIS — F411 Generalized anxiety disorder: Secondary | ICD-10-CM

## 2022-06-16 DIAGNOSIS — E1165 Type 2 diabetes mellitus with hyperglycemia: Secondary | ICD-10-CM | POA: Diagnosis not present

## 2022-06-16 DIAGNOSIS — R932 Abnormal findings on diagnostic imaging of liver and biliary tract: Secondary | ICD-10-CM | POA: Diagnosis not present

## 2022-06-16 DIAGNOSIS — C519 Malignant neoplasm of vulva, unspecified: Secondary | ICD-10-CM

## 2022-06-16 DIAGNOSIS — I10 Essential (primary) hypertension: Secondary | ICD-10-CM

## 2022-06-16 DIAGNOSIS — D510 Vitamin B12 deficiency anemia due to intrinsic factor deficiency: Secondary | ICD-10-CM | POA: Diagnosis not present

## 2022-06-16 MED ORDER — FLUNISOLIDE 25 MCG/ACT (0.025%) NA SOLN: 2.0000 | Freq: Every day | NASAL | 5 refills | Status: DC | PRN

## 2022-06-16 MED ORDER — POTASSIUM CHLORIDE 20 MEQ PO PACK: 20.0000 meq | PACK | ORAL | 1 refills | Status: DC | PRN

## 2022-06-16 MED ORDER — SPIRONOLACTONE 25 MG PO TABS: 25.0000 mg | ORAL_TABLET | ORAL | 0 refills | Status: DC | PRN

## 2022-06-16 MED ORDER — EPINEPHRINE 0.3 MG/0.3ML IJ SOAJ: INTRAMUSCULAR | 1 refills | Status: DC

## 2022-06-16 NOTE — Assessment & Plan Note (Signed)
She has made some dietary changes which is great.  We also discussed starting a medication she does not want to use metformin she wants to make sure it something fairly small.  Check with our clinical pharmacist to see if she has any suggestions.

## 2022-06-16 NOTE — Progress Notes (Signed)
Established Patient Office Visit  Subjective   Patient ID: Jennifer [name], female    DOB: 1966/04/14  Age: 56 y.o. MRN: [id number]  Chief Complaint  Patient presents with   Follow-up    HPI   F/U HTN - still uncontrolled.    F/U DM -he has been trying to change her diet and make some adjustments.  She is just been a little bit frustrated with what family members are bringing into the house.  She did follow-up with gynecology oncology for her Paget's disease of the vulva.  They recommended surgical excision of the area over treatment topical therapies because of her prior diagnosis and treatment back in 2021.  The initial biopsy was negative for any abnormal cells.  She unfortunately started bleeding from the wound about 7 days out and ended up going to the emergency department.  The it was still bleeding some of the time she left but it slowed down.  They felt like it was inflamed and infected and so they placed her on clindamycin.  She still taking the medication she says it still feels tender and sore.  And she is worried that if she has surgery that the team may not be responsive if she is having complications.  Also had a little bit of feeling short of breath especially with activities.     ROS    Objective:     BP (!) 156/81   Pulse 89   Ht '5\' 2"'$  (1.575 m)   Wt 240 lb 4.8 oz (109 kg)   LMP 06/25/2013   SpO2 97%   BMI 43.95 kg/m    Physical Exam Vitals and nursing note reviewed.  Constitutional:      Appearance: She is well-developed.  HENT:     Head: Normocephalic and atraumatic.  Cardiovascular:     Rate and Rhythm: Normal rate and regular rhythm.     Heart sounds: Normal heart sounds.  Pulmonary:     Effort: Pulmonary effort is normal.     Breath sounds: Normal breath sounds.  Skin:    General: Skin is warm and dry.  Neurological:     Mental Status: She is alert and oriented to person, place, and time.  Psychiatric:        Behavior: Behavior normal.       Results for orders placed or performed in visit on 06/16/22  B12  Result Value Ref Range   Vitamin B-12 480 200 - 1,100 pg/mL      The ASCVD Risk score (Arnett DK, et al., 2019) failed to calculate for the following reasons:   The patient has a prior MI or stroke diagnosis    Assessment & Plan:   Problem List Items Addressed This Visit       Cardiovascular and Mediastinum   Hypertension with intolerance to multiple antihypertensive drugs - Primary    Pressure uncontrolled.  Still working with cardiology on trying to find the right regimen they have talked at some point about flecainide more for rate control but she is very hesitant to do that but is willing to maybe try sotalol again.      Relevant Medications   EPINEPHrine 0.3 mg/0.3 mL IJ SOAJ injection   spironolactone (ALDACTONE) 25 MG tablet     Endocrine   Uncontrolled type 2 diabetes mellitus with hyperglycemia ([place])    She has made some dietary changes which is great.  We also discussed starting a medication she does not want to use  metformin she wants to make sure it something fairly small.  Check with our clinical pharmacist to see if she has any suggestions.        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        Other   Pernicious anemia    Plan to recheck B12 to see if we need to continue with injections.      Relevant Orders   B12 (Completed)   GAD (generalized anxiety disorder)   Other Visit Diagnoses     Abnormal liver diagnostic imaging       Relevant Orders   Ambulatory referral to Gastroenterology       No follow-ups on file.    I spent 45 minutes on the day of the encounter to include pre-visit record review, face-to-face time with the patient and post visit ordering of test.  Beatrice Lecher, MD

## 2022-06-16 NOTE — Assessment & Plan Note (Signed)
They have recommended repeat excision and would like to schedule her at the end of the month for surgery.

## 2022-06-16 NOTE — Assessment & Plan Note (Signed)
Pressure uncontrolled.  Still working with cardiology on trying to find the right regimen they have talked at some point about flecainide more for rate control but she is very hesitant to do that but is willing to maybe try sotalol again.

## 2022-06-16 NOTE — Assessment & Plan Note (Signed)
Plan to recheck B12 to see if we need to continue with injections.

## 2022-06-17 DIAGNOSIS — I491 Atrial premature depolarization: Secondary | ICD-10-CM | POA: Diagnosis not present

## 2022-06-17 DIAGNOSIS — R000 Tachycardia, unspecified: Secondary | ICD-10-CM | POA: Diagnosis not present

## 2022-06-17 DIAGNOSIS — I517 Cardiomegaly: Secondary | ICD-10-CM | POA: Diagnosis not present

## 2022-06-17 DIAGNOSIS — R002 Palpitations: Secondary | ICD-10-CM | POA: Diagnosis not present

## 2022-06-17 LAB — VITAMIN B12: Vitamin B-12: 480 pg/mL (ref 200–1100)

## 2022-06-18 ENCOUNTER — Emergency Department (HOSPITAL_BASED_OUTPATIENT_CLINIC_OR_DEPARTMENT_OTHER): Payer: Medicare HMO

## 2022-06-18 ENCOUNTER — Other Ambulatory Visit: Payer: Self-pay

## 2022-06-18 ENCOUNTER — Emergency Department (HOSPITAL_BASED_OUTPATIENT_CLINIC_OR_DEPARTMENT_OTHER)
Admission: EM | Admit: 2022-06-18 | Discharge: 2022-06-18 | Disposition: A | Discharge status: Home / Self Care | Payer: Medicare HMO | Attending: Emergency Medicine | Admitting: Emergency Medicine

## 2022-06-18 ENCOUNTER — Other Ambulatory Visit (HOSPITAL_BASED_OUTPATIENT_CLINIC_OR_DEPARTMENT_OTHER): Payer: Medicare HMO

## 2022-06-18 ENCOUNTER — Encounter (HOSPITAL_BASED_OUTPATIENT_CLINIC_OR_DEPARTMENT_OTHER): Payer: Self-pay | Admitting: Emergency Medicine

## 2022-06-18 DIAGNOSIS — J45909 Unspecified asthma, uncomplicated: Secondary | ICD-10-CM | POA: Diagnosis not present

## 2022-06-18 DIAGNOSIS — M542 Cervicalgia: Secondary | ICD-10-CM | POA: Diagnosis not present

## 2022-06-18 DIAGNOSIS — I10 Essential (primary) hypertension: Secondary | ICD-10-CM | POA: Diagnosis not present

## 2022-06-18 DIAGNOSIS — Z79899 Other long term (current) drug therapy: Secondary | ICD-10-CM | POA: Insufficient documentation

## 2022-06-18 DIAGNOSIS — R000 Tachycardia, unspecified: Secondary | ICD-10-CM | POA: Diagnosis not present

## 2022-06-18 DIAGNOSIS — R059 Cough, unspecified: Secondary | ICD-10-CM | POA: Diagnosis not present

## 2022-06-18 DIAGNOSIS — Z8542 Personal history of malignant neoplasm of other parts of uterus: Secondary | ICD-10-CM | POA: Insufficient documentation

## 2022-06-18 DIAGNOSIS — I491 Atrial premature depolarization: Secondary | ICD-10-CM | POA: Diagnosis not present

## 2022-06-18 DIAGNOSIS — E119 Type 2 diabetes mellitus without complications: Secondary | ICD-10-CM | POA: Insufficient documentation

## 2022-06-18 DIAGNOSIS — R0602 Shortness of breath: Secondary | ICD-10-CM | POA: Diagnosis not present

## 2022-06-18 MED ORDER — ALUM & MAG HYDROXIDE-SIMETH 200-200-20 MG/5ML PO SUSP: 30.0000 mL | Freq: Once | ORAL | Status: DC

## 2022-06-18 NOTE — ED Triage Notes (Signed)
Pt reports anterior neck pain, states she thinks she had an asthma attack earlier but the inhaler didn't help. Pt states she has seen her PCP for same. EDP in room on arrival. Pt asking for "XR of throat".
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
## 2022-06-19 ENCOUNTER — Telehealth: Payer: Self-pay

## 2022-06-19 DIAGNOSIS — R42 Dizziness and giddiness: Secondary | ICD-10-CM | POA: Diagnosis not present

## 2022-06-19 DIAGNOSIS — R739 Hyperglycemia, unspecified: Secondary | ICD-10-CM | POA: Diagnosis not present

## 2022-06-19 DIAGNOSIS — F439 Reaction to severe stress, unspecified: Secondary | ICD-10-CM | POA: Diagnosis not present

## 2022-06-19 DIAGNOSIS — E1165 Type 2 diabetes mellitus with hyperglycemia: Secondary | ICD-10-CM | POA: Diagnosis not present

## 2022-06-19 DIAGNOSIS — F419 Anxiety disorder, unspecified: Secondary | ICD-10-CM | POA: Diagnosis not present

## 2022-06-19 DIAGNOSIS — H539 Unspecified visual disturbance: Secondary | ICD-10-CM | POA: Diagnosis not present

## 2022-06-19 DIAGNOSIS — R0602 Shortness of breath: Secondary | ICD-10-CM | POA: Diagnosis not present

## 2022-06-19 DIAGNOSIS — I491 Atrial premature depolarization: Secondary | ICD-10-CM | POA: Diagnosis not present

## 2022-06-19 NOTE — Telephone Encounter (Deleted)
Jennifer [name] called and wanted to know if the referral for her liver was done. Also wanted to know what medication for diabetes will Dr Madilyn Fireman prescribe?

## 2022-06-19 NOTE — Progress Notes (Signed)
Hi [name], B12 is normal lets plan to recheck again in 3 to 4 months.

## 2022-06-19 NOTE — Addendum Note (Signed)
Addended by: Beatrice Lecher D on: 06/19/2022 04:40 PM   Modules accepted: Orders

## 2022-06-19 NOTE — Telephone Encounter (Signed)
Marquia called and wanted to know if the referral for her liver was done. Also wanted to know what medication for diabetes will Dr Madilyn Fireman prescribe?

## 2022-06-20 ENCOUNTER — Encounter: Payer: Self-pay | Admitting: Internal Medicine

## 2022-06-20 ENCOUNTER — Ambulatory Visit (INDEPENDENT_AMBULATORY_CARE_PROVIDER_SITE_OTHER): Payer: Medicare HMO | Admitting: Internal Medicine

## 2022-06-20 VITALS — BP 144/84 | HR 80 | Temp 97.9°F | Resp 19 | Ht 62.0 in | Wt 240.0 lb

## 2022-06-20 DIAGNOSIS — J387 Other diseases of larynx: Secondary | ICD-10-CM | POA: Diagnosis not present

## 2022-06-20 DIAGNOSIS — I493 Ventricular premature depolarization: Secondary | ICD-10-CM | POA: Diagnosis not present

## 2022-06-20 DIAGNOSIS — J45998 Other asthma: Secondary | ICD-10-CM

## 2022-06-20 DIAGNOSIS — R053 Chronic cough: Secondary | ICD-10-CM | POA: Diagnosis not present

## 2022-06-20 DIAGNOSIS — J4540 Moderate persistent asthma, uncomplicated: Secondary | ICD-10-CM

## 2022-06-20 DIAGNOSIS — I491 Atrial premature depolarization: Secondary | ICD-10-CM | POA: Diagnosis not present

## 2022-06-20 DIAGNOSIS — J3089 Other allergic rhinitis: Secondary | ICD-10-CM | POA: Diagnosis not present

## 2022-06-20 DIAGNOSIS — K219 Gastro-esophageal reflux disease without esophagitis: Secondary | ICD-10-CM

## 2022-06-20 MED ORDER — RYBELSUS 3 MG PO TABS: 3.0000 mg | ORAL_TABLET | Freq: Every day | ORAL | 0 refills | Status: DC

## 2022-06-20 NOTE — Patient Instructions (Addendum)
Coughing: Daily controller medication(s): does not tolerate any inhaled steroids  Can try arnuity 150mg 1 puffs twice daily as needed to reduce exposure to other steroids   May use levoalbuterol rescue inhaler 2 puffs or nebulizer every 4 to 6 hours as needed for shortness of breath, chest tightness, coughing, and wheezing. Monitor frequency of use.  Breathing control goals:  Full participation in all desired activities (may need albuterol before activity) Albuterol use two times or less a week on average (not counting use with activity) Cough interfering with sleep two times or less a month Oral steroids no more than once a year No hospitalizations   Reflux Continue dietary lifestyle modifications. Continue the regimen as prescribed by your gastroenterologist.  Keep a diary to track symptoms and food exposures  Chronic rhinitis Continue flunisolide nasal spray 2 sprays in each nostril once a day a day as needed for stuffy nose.  In the right nostril, point the applicator out toward the right ear. 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   High blood pressure and palpitations  Monitor symptoms and follow up with PCP and cardiology   Follow up:  3 months, we will contact you with lab results prior   Thank you so much for letting me partake in your care today.  Don't  hesitate to reach out if you have any additional concerns!  ERoney Marion MD  [facility] [place]

## 2022-06-20 NOTE — Telephone Encounter (Signed)
New rx sent for Alysis to pharmacy.  It should help lower A1c without bottoming her out and actually also helps curb appetite so he can promote weight loss as well.  The pharmacist reassured me that it is a smaller.

## 2022-06-20 NOTE — Progress Notes (Unsigned)
 Follow Up Note  RE: Jennifer [name] MRN: [id number] DOB: 06/19/1966 Date of Office Visit: 06/20/2022  Referring provider: Jackson-Moore, Lisa, MD Primary care provider: Jackson-Moore, Lisa, MD  Chief Complaint: No chief complaint on file.  History of Present Illness: I had the pleasure of seeing Jennifer [name] for a follow up visit at the [facility] on 06/20/2022. She is a 56 y.o. female, who is being followed for ***. Her previous allergy office visit was on *** with {Blank single:19197::"Dr. Dennis","Dr. ","Dr. Kim","Dr. Kozlow","Dr. Bardelas","Dr. Bobbitt","Dr. Gallagher","Dr. Padgett","Anne Ambs, FNP","Christine Dale FNP"}. Today is a {Blank single:19197::"regular follow up visit","new complaint visit of ***","skin testing and follow up visit"}.  History obtained from patient, chart review and {Blank single:19197::"mother","father","interpreter"}.  ***  Assessment and Plan: Jennifer [name] is a 56 y.o. female with: No diagnosis found. Plan: There are no Patient Instructions on file for this visit. No follow-ups on file.  No orders of the defined types were placed in this encounter.   Lab Orders  No laboratory test(s) ordered today   Diagnostics: Spirometry:  Tracings reviewed. Her effort: It was hard to get consistent efforts and there is a question as to whether this reflects a maximal maneuver. FVC: 1.92 L FEV1: 1.72 L, 71% predicted FEV1/FVC ratio: 90% Interpretation: Spirometry consistent with possible restrictive disease.  Please see scanned spirometry results for details.  Skin Testing: {Blank single:19197::"Select foods","Environmental allergy panel","Environmental allergy panel and select foods","Food allergy panel","None","Deferred due to recent antihistamines use"}. *** Results interpreted by myself during this encounter and discussed with patient/family.   Medication List:  Current Outpatient Medications  Medication Sig Dispense  Refill   blood glucose meter kit and supplies Dispense based on patient and insurance preference. Use to check blood sugars twice daily E11.9 1 each 0   diazepam (VALIUM) 2 MG tablet Take 1 tablet (2 mg total) by mouth daily as needed for anxiety. 10 tablet 0   EPINEPHrine 0.3 mg/0.3 mL IJ SOAJ injection Use as directed for severe allergic reactions 2 each 1   famotidine (PEPCID) 20 MG tablet Take by mouth.     flunisolide (NASALIDE) 25 MCG/ACT (0.025%) SOLN Place 2 sprays into the nose daily as needed (for stuffy nose). 25 mL 5   levalbuterol (XOPENEX HFA) 45 MCG/ACT inhaler INHALE 2 PUFFS INTO THE LUNGS EVERY 6 HOURS AS NEEDED FOR WHEEZING 45 g 1   metoprolol tartrate (LOPRESSOR) 50 MG tablet Take 1 tablet (50 mg total) by mouth 2 (two) times daily. 180 tablet 1   potassium chloride (KLOR-CON) 20 MEQ packet Take 20 mEq by mouth as needed. 30 each 1   spironolactone (ALDACTONE) 25 MG tablet Take 1 tablet (25 mg total) by mouth as needed. 30 tablet 0   No current facility-administered medications for this visit.   Allergies: Allergies  Allergen Reactions   Azithromycin Shortness Of Breath    Lip swelling, SOB.      Ciprofloxacin Swelling    REACTION: tongue swells   Codeine Shortness Of Breath   Erythromycin Base Itching and Rash   Sulfa Antibiotics Shortness Of Breath, Rash and Other (See Comments)   Sulfasalazine Rash and Shortness Of Breath    Other reaction(s): Other (See Comments) Other reaction(s): SHORTNESS OF BREATH   Telmisartan Swelling    Tongue swelling, Micardis   Ace Inhibitors Cough   Aspirin Hives and Other (See Comments)    flushing   Atenolol Other (See Comments)    Squeezing chest sensation   Avelox [Moxifloxacin   Hcl In Nacl] Itching        Beta Adrenergic Blockers Other (See Comments)    Feels like chest tightening labetalol, bystolic  Feels like chest tightening "Metoprolol"    Buspar [Buspirone] Other (See Comments)    Light headed   Butorphanol  Tartrate Other (See Comments)    Patient aggitated   Cetirizine Hives and Rash        Clonidine Hcl     REACTION: makes blood pressure high   Cortisone     Feels like she is going crazy   Erythromycin Rash   Fentanyl Other (See Comments)    aggressive    Fluoxetine Hcl Other (See Comments)    REACTION: headaches   Ketorolac Tromethamine     jittery   Lidocaine Other (See Comments)    When it involves the throat,    Lisinopril Cough   Metoclopramide Hcl Other (See Comments)    Dystonic reaction   Midazolam Other (See Comments)    agitation Slow to wake up   Montelukast Other (See Comments)    Singulair   Montelukast Sodium Other (See Comments)    DOES NOT REMEMBER  Don't remember-told not to take   Naproxen Other (See Comments)    FLUSHING Pt states she took Ibuprofen today (10/08/19)   Paroxetine Other (See Comments)    REACTION: headaches   Penicillins Rash   Pravastatin Other (See Comments)    Myalgias   Promethazine Other (See Comments)    Dystonic reaction   Promethazine Hcl Other (See Comments)    jittery   Quinolones Swelling and Rash   Serotonin Reuptake Inhibitors (Ssris) Other (See Comments)    Headache Effexor, prozac, zoloft,    Sertraline Hcl     REACTION: headaches   Stelazine [Trifluoperazine] Other (See Comments)    Dystonic reaction   Tobramycin Itching and Rash   Trifluoperazine Hcl     dystonic   Atrovent Nasal Spray [Ipratropium]     Tachycardia and shaking   Diltiazem Other (See Comments)    Chest pain   Iodinated Contrast Media     Other reaction(s): Other (See Comments) Chest heaviness/sob   Polyethylene Glycol 3350     Other reaction(s): Laryngeal Edema (ALLERGY)   Propoxyphene    Adhesive [Tape] Rash    EKG monitor patches, some tapes Blisters, rash, itching, welts.   Butorphanol Anxiety    Patient agitated   Ceftriaxone Rash    rocephin   Iron Rash    Flushing with certain IV types   Metoclopramide Itching and Other (See  Comments)    Dystonic reaction   Metronidazole Rash   Other Rash and Other (See Comments)    Uncoded Allergy. Allergen: steriods, Other Reaction: Not Assessed Other reaction(s): Flushing (ALLERGY/intolerance), GI Upset (intolerance), Hypertension (intolerance), Increased Heart Rate (intolerance), Mental Status Changes (intolerance), Other (See Comments), Tachycardia / Palpitations  (intolerance) Hospital gowns leave a rash.    Prednisone Anxiety and Palpitations   Prochlorperazine Anxiety    Compazine:  Dystonic reaction   Venlafaxine Anxiety   Zyrtec [Cetirizine Hcl] Rash    All over body   I reviewed her past medical history, social history, family history, and environmental history and no significant changes have been reported from her previous visit.  ROS: All others negative except as noted per HPI.   Objective: LMP 06/25/2013  There is no height or weight on file to calculate BMI. General Appearance:  Alert, cooperative, no distress, appears stated age  Head:    Normocephalic, without obvious abnormality, atraumatic  Eyes:  Conjunctiva clear, EOM's intact  Nose: Nares normal, {Blank multiple:19196:a:"***","hypertrophic turbinates","normal mucosa","no visible anterior polyps","septum midline"}  Throat: Lips, tongue normal; teeth and gums normal, {Blank multiple:19196:a:"***","normal posterior oropharynx","tonsils 2+","tonsils 3+","no tonsillar exudate","+ cobblestoning"}  Neck: Supple, symmetrical  Lungs:   {Blank multiple:19196:a:"***","clear to auscultation bilaterally","end-expiratory wheezing","wheezing throughout"}, Respirations unlabored, {Blank multiple:19196:a:"***","no coughing","intermittent dry coughing"}  Heart:  {Blank multiple:19196:a:"***","regular rate and rhythm","no murmur"}, Appears well perfused  Extremities: No edema  Skin: Skin color, texture, turgor normal, no rashes or lesions on visualized portions of skin {Blank multiple:19196:a:""***"}  Neurologic: No  gross deficits   Previous notes and tests were reviewed. The plan was reviewed with the patient/family, and all questions/concerned were addressed.  It was my pleasure to see Jennifer [name] today and participate in her care. Please feel free to contact me with any questions or concerns.  Sincerely,  Roney Marion, MD  Allergy & Immunology  [facility] Office: [phone]

## 2022-06-21 ENCOUNTER — Emergency Department (HOSPITAL_BASED_OUTPATIENT_CLINIC_OR_DEPARTMENT_OTHER): Payer: Medicare HMO

## 2022-06-21 ENCOUNTER — Emergency Department (HOSPITAL_BASED_OUTPATIENT_CLINIC_OR_DEPARTMENT_OTHER)
Admission: EM | Admit: 2022-06-21 | Discharge: 2022-06-21 | Disposition: A | Discharge status: Home / Self Care | Payer: Medicare HMO | Attending: Emergency Medicine | Admitting: Emergency Medicine

## 2022-06-21 ENCOUNTER — Encounter (HOSPITAL_BASED_OUTPATIENT_CLINIC_OR_DEPARTMENT_OTHER): Payer: Self-pay | Admitting: Pediatrics

## 2022-06-21 ENCOUNTER — Ambulatory Visit: Payer: Medicare Other | Admitting: Obstetrics & Gynecology

## 2022-06-21 ENCOUNTER — Other Ambulatory Visit: Payer: Self-pay

## 2022-06-21 DIAGNOSIS — M79622 Pain in left upper arm: Secondary | ICD-10-CM | POA: Insufficient documentation

## 2022-06-21 DIAGNOSIS — M7989 Other specified soft tissue disorders: Secondary | ICD-10-CM | POA: Diagnosis not present

## 2022-06-21 DIAGNOSIS — M79602 Pain in left arm: Secondary | ICD-10-CM | POA: Diagnosis not present

## 2022-06-21 DIAGNOSIS — J3089 Other allergic rhinitis: Secondary | ICD-10-CM | POA: Diagnosis not present

## 2022-06-21 DIAGNOSIS — I10 Essential (primary) hypertension: Secondary | ICD-10-CM | POA: Diagnosis not present

## 2022-06-21 DIAGNOSIS — R053 Chronic cough: Secondary | ICD-10-CM | POA: Diagnosis not present

## 2022-06-21 NOTE — Discharge Instructions (Signed)
Your DVT study is negative.  Follow-up with your primary care doctor.  There is no acute emergency at this time.  Please for chronic pain management follow-up with your primary care doctor.

## 2022-06-21 NOTE — ED Notes (Signed)
US at bedside

## 2022-06-21 NOTE — ED Provider Notes (Signed)
[facility] EMERGENCY DEPARTMENT Provider Note   CSN: [id number] Arrival date & time: 06/21/22  1207     History  Chief Complaint  Patient presents with   Arm Swelling    Jennifer [name] is a 56 y.o. female.  Patient here with pain to the left upper arm and swelling.  She has been told she has had lipomas in the past.  Pain is been more frequent worse in the morning.  She feels like her arm is more swollen than normal.  She denies any history of clots.  She denies any chest pain or shortness of breath or abdominal pain.  She says she seen a surgeon in the past about lipomas on the upper portion of the arm but she feels like she is having more knot in the tricep area.  Denies any trauma.  No weakness.  Tingling at times.  The history is provided by the patient.       Home Medications Prior to Admission medications   Medication Sig Start Date End Date Taking? Authorizing Provider  blood glucose meter kit and supplies Dispense based on patient and insurance preference. Use to check blood sugars twice daily E11.9 06/02/22   [name], MD  diazepam (VALIUM) 2 MG tablet Take 1 tablet (2 mg total) by mouth daily as needed for anxiety. 05/01/22   [name], MD  EPINEPHrine 0.3 mg/0.3 mL IJ SOAJ injection Use as directed for severe allergic reactions 06/16/22   [name], MD  famotidine (PEPCID) 20 MG tablet Take by mouth. 05/06/21   [provider]  flunisolide (NASALIDE) 25 MCG/ACT (0.025%) SOLN Place 2 sprays into the nose daily as needed (for stuffy nose). 06/16/22   [name], MD  levalbuterol (XOPENEX HFA) 45 MCG/ACT inhaler INHALE 2 PUFFS INTO THE LUNGS EVERY 6 HOURS AS NEEDED FOR WHEEZING 04/11/22   Roney Marion, MD  metoprolol tartrate (LOPRESSOR) 50 MG tablet Take 1 tablet (50 mg total) by mouth 2 (two) times daily. 11/24/21   [name], MD  potassium chloride (KLOR-CON) 20 MEQ packet Take 20 mEq by mouth  as needed. 06/16/22   [name], MD  Semaglutide (RYBELSUS) 3 MG TABS Take 3 mg by mouth daily. 06/20/22   [name], MD  spironolactone (ALDACTONE) 25 MG tablet Take 1 tablet (25 mg total) by mouth as needed. 06/16/22   [name], MD      Allergies    Azithromycin, Ciprofloxacin, Codeine, Erythromycin base, Sulfa antibiotics, Sulfasalazine, Telmisartan, Ace inhibitors, Aspirin, Atenolol, Avelox [moxifloxacin hcl in nacl], Beta adrenergic blockers, Buspar [buspirone], Butorphanol tartrate, Cetirizine, Clonidine hcl, Cortisone, Erythromycin, Fentanyl, Fluoxetine hcl, Ketorolac tromethamine, Lidocaine, Lisinopril, Metoclopramide hcl, Midazolam, Montelukast, Montelukast sodium, Naproxen, Paroxetine, Penicillins, Pravastatin, Promethazine, Promethazine hcl, Quinolones, Serotonin reuptake inhibitors (ssris), Sertraline hcl, Stelazine [trifluoperazine], Tobramycin, Trifluoperazine hcl, Atrovent nasal spray [ipratropium], Diltiazem, Iodinated contrast media, Polyethylene glycol 3350, Propoxyphene, Adhesive [tape], Butorphanol, Ceftriaxone, Iron, Metoclopramide, Metronidazole, Other, Prednisone, Prochlorperazine, Venlafaxine, and Zyrtec [cetirizine hcl]    Review of Systems   Review of Systems  Physical Exam Updated Vital Signs BP (!) 165/97 (BP Location: Right Arm)   Pulse 94   Temp 98.5 F (36.9 C) (Oral)   Resp 18   Ht _0  (1.575 m)   Wt 108.9 kg   LMP 06/25/2013   SpO2 94%   BMI 43.90 kg/m  Physical Exam Vitals and nursing note reviewed.  Constitutional:      General: She is not  in acute distress.    Appearance: She is well-developed.  HENT:     Head: Normocephalic and atraumatic.     Nose: Nose normal.     Mouth/Throat:     Mouth: Mucous membranes are moist.  Eyes:     Extraocular Movements: Extraocular movements intact.     Conjunctiva/sclera: Conjunctivae normal.     Pupils: Pupils are equal, round, and reactive to light.  Cardiovascular:      Rate and Rhythm: Normal rate and regular rhythm.     Heart sounds: No murmur heard. Pulmonary:     Effort: Pulmonary effort is normal. No respiratory distress.     Breath sounds: Normal breath sounds.  Abdominal:     Palpations: Abdomen is soft.     Tenderness: There is no abdominal tenderness.  Musculoskeletal:        General: Tenderness present. No swelling. Normal range of motion.     Cervical back: Neck supple.     Comments: Patient has several palpable freely movable soft tissue densities in the left upper arm but there is no redness or erythema or swelling  Skin:    General: Skin is warm and dry.     Capillary Refill: Capillary refill takes less than 2 seconds.  Neurological:     General: No focal deficit present.     Mental Status: She is alert and oriented to person, place, and time.     Cranial Nerves: No cranial nerve deficit.     Sensory: No sensory deficit.     Motor: No weakness.     Comments: 5+ out of 5 strength throughout, normal sensation  Psychiatric:        Mood and Affect: Mood normal.     ED Results / Procedures / Treatments   Labs (all labs ordered are listed, but only abnormal results are displayed) Labs Reviewed - No data to display  EKG None  Radiology US Venous Img Upper Left (DVT Study)  Result Date: 06/21/2022 CLINICAL DATA:  pain EXAM: LEFT UPPER EXTREMITY VENOUS DOPPLER ULTRASOUND TECHNIQUE: Gray-scale sonography with graded compression, as well as color Doppler and duplex ultrasound were performed to evaluate the upper extremity deep venous system from the level of the subclavian vein and including the jugular, axillary, basilic, radial, ulnar and upper cephalic vein. Spectral Doppler was utilized to evaluate flow at rest and with distal augmentation maneuvers. COMPARISON:  None Available. FINDINGS: Contralateral Subclavian Vein: Respiratory phasicity is normal and symmetric with the symptomatic side. No evidence of thrombus. Normal  compressibility. Internal Jugular Vein: No evidence of thrombus. Normal compressibility, respiratory phasicity and response to augmentation. Subclavian Vein: No evidence of thrombus. Normal compressibility, respiratory phasicity and response to augmentation. Axillary Vein: No evidence of thrombus. Normal compressibility, respiratory phasicity and response to augmentation. Cephalic Vein: No evidence of thrombus. Normal compressibility, respiratory phasicity and response to augmentation. Basilic Vein: No evidence of thrombus. Normal compressibility, respiratory phasicity and response to augmentation. Brachial Veins: No evidence of thrombus. Normal compressibility, respiratory phasicity and response to augmentation. Radial Veins: No evidence of thrombus. Normal compressibility, respiratory phasicity and response to augmentation. Ulnar Veins: No evidence of thrombus. Normal compressibility, respiratory phasicity and response to augmentation. IMPRESSION: No evidence of DVT within the left upper extremity. Electronically Signed   By: Margaretha Sheffield M.D.   On: 06/21/2022 14:21    Procedures Procedures    Medications Ordered in ED Medications - No data to display  ED Course/ Medical Decision Making/ A&P  Medical Decision Making  NIURKA BENECKE is here with swelling to the left upper arm.  History of lipomas.  She feels that there is more swelling and discomfort in the arm now more frequently.  No history of DVT.  Chronic pain.  Overall clinically I feel some soft tissue densities that she is I suspect are likely lipomas.  She does not have any redness or erythema or obvious swelling.  Neurovascular neuromuscular she is intact.  She has good strength and sensation.  We will get a DVT study to make sure there is no blood clot.  We will have her follow-up with primary care doctor.  DVT study negative.  Overall suspect acute on chronic pain.  Recommend follow-up with primary care  doctor.  Discharged in good condition.  This chart was dictated using voice recognition software.  Despite best efforts to proofread,  errors can occur which can change the documentation meaning.         Final Clinical Impression(s) / ED Diagnoses Final diagnoses:  Pain of left upper extremity    Rx / DC Orders ED Discharge Orders     None         Lennice Sites, DO 06/21/22 1425

## 2022-06-21 NOTE — ED Triage Notes (Signed)
Reported left arm pain and swelling; more so than usual.

## 2022-06-22 ENCOUNTER — Telehealth: Payer: Self-pay

## 2022-06-22 DIAGNOSIS — R059 Cough, unspecified: Secondary | ICD-10-CM | POA: Diagnosis not present

## 2022-06-22 DIAGNOSIS — R0602 Shortness of breath: Secondary | ICD-10-CM | POA: Diagnosis not present

## 2022-06-22 DIAGNOSIS — Z87891 Personal history of nicotine dependence: Secondary | ICD-10-CM | POA: Diagnosis not present

## 2022-06-22 DIAGNOSIS — I10 Essential (primary) hypertension: Secondary | ICD-10-CM | POA: Diagnosis not present

## 2022-06-22 DIAGNOSIS — Z20822 Contact with and (suspected) exposure to covid-19: Secondary | ICD-10-CM | POA: Diagnosis not present

## 2022-06-22 NOTE — Telephone Encounter (Signed)
The eye doctors do specialize in pancreas and liver.  I am not sure what she is asking.  I think she needs to start with her personal GI doctor or we can see if someone in his practice specializes in liver.

## 2022-06-22 NOTE — Telephone Encounter (Signed)
Patient calling back to let us know she noticed a couple hours after she took the arnuity a 1/2 inch rash under her nose and a little puffiness above her top lip and she mentioned you can see her creases on both sides of her mouth became more prominent. I asked the patient if the arnuity helped her cough and she said she isn't coughing now like she was this morning. I told the patient  when she takes the arnuity again to see if those same occurrences happen again and if so to give [place] a call on Monday when Dr. Edison Pace is in the office. I also told the patient to take pictures of the rash.

## 2022-06-22 NOTE — Telephone Encounter (Signed)
Patient informed. She states that  she did not want to see Dr.Rhoton but wanted to see someone with  pt states Nephrology (?)  instead - she states "[name] knows what she wants". She wants someone who specializes in liver and pancreas that is not GI.   She states she has questions about Rybelsus and will discuss with provider before starting this.

## 2022-06-22 NOTE — Telephone Encounter (Signed)
Looking at her previous spirometry tests her breathing seems to be consistent. I would try the Arnuity as recommended by Dr. Edison Pace. She also has levoalbuterol to use 2 puffs every 4-6 hours to use as needed for cough, wheeze, tightness in chest, or shortness of breath.If her levoalbuterol does not help she needs to go to the ER. Let us know if she breaks out around her mouth with the use of Arnuity. We will see if Dr. Edison Pace has anything to add when she is back in the office next week.

## 2022-06-22 NOTE — Telephone Encounter (Signed)
Patient calling stating her breathing feels worse today than it did on Tuesday. She did take a puff of her arnuity this morning and she was concerned about is this medication going to break her out around her mouth and increase her blood sugars. Patient also stated Dr. Edison Pace said she doesn't have to take her arnuity every day. Patient's question is her breathing getting worse? Please Advise?

## 2022-06-22 NOTE — Telephone Encounter (Signed)
Noted. Thank you for the update.

## 2022-06-26 DIAGNOSIS — R0602 Shortness of breath: Secondary | ICD-10-CM | POA: Diagnosis not present

## 2022-06-27 ENCOUNTER — Telehealth: Payer: Self-pay

## 2022-06-27 ENCOUNTER — Telehealth: Payer: Self-pay | Admitting: Psychiatry

## 2022-06-27 NOTE — Telephone Encounter (Signed)
Nieves stating she can't do the arnuity 100 mcg because it's causing her to break out around her lips and looking blotchy. She says the arnuity didn't really help with her cough any way. Patient is doing all the right things such as doing her nasal rinses twice daily, and using her flunisolide nasal spray. Patient would like to do an aerosol steroid and not a powder.

## 2022-06-27 NOTE — Telephone Encounter (Signed)
Called pt regarding her questions for surgery.  Pt with questions about amount of anesthesia, recovery, vulvar wound, etc. She reports that she sees Dr. Alroy Dust with [facility] tomorrow on 11/15 and she sees a NP with [facility] on 11/16. She will let us know which office she plans to stick with so that we can request cardiac clearance there after.  Also discussed her questions about timing of surgery. Discussed that given she has already gone 2 years without treatment, would not recommend continuing to delay surgery. She expresses concern about trying to work on her palpitations and new diagnosis of diabetes (A1c 7.3). Encouraged her that that was great that she is trying to work on improving her health. But do not want her vulva to become more of a problem for her if she does not address it.   Encouraged patient to consider and OR date in December and early January at the latest (assuming that she is clear for surgery from cardiology). Would recommend follow-up before that to ensure no further infection at the biopsy site that would require additional antibiotics prior to surgery.   Will have our clinic reach back out at the end of the week.

## 2022-06-27 NOTE — Telephone Encounter (Signed)
Iretha stating

## 2022-06-27 NOTE — Telephone Encounter (Signed)
Pt called stating she has questions from [facility]. She thinks she would like to push her upcoming surgery (11/28) out d/t she has a lot going on with her health.   Pt also states she is in between cardiologist doctors. She states she knows we need to get clearance but is wondering what happens if she isn't cleared for surgery.   Also, has questions regarding her surgery. She declined picking a new surgery date in December (any Tuesday per [facility]) until she speaks to [place]. She did mention maybe even moving it out until the spring.   Pt aware Lenna Sciara is in clinic today and in the OR the next 2 days. She says it is fine and Melissa can call whenever she can.

## 2022-06-27 NOTE — Telephone Encounter (Signed)
Patient will address with provider at upcoming appt schld for Friday 06/30/22/

## 2022-06-28 ENCOUNTER — Telehealth: Payer: Self-pay

## 2022-06-28 DIAGNOSIS — I4719 Other supraventricular tachycardia: Secondary | ICD-10-CM | POA: Diagnosis not present

## 2022-06-28 DIAGNOSIS — R002 Palpitations: Secondary | ICD-10-CM | POA: Diagnosis not present

## 2022-06-28 DIAGNOSIS — I10 Essential (primary) hypertension: Secondary | ICD-10-CM | POA: Diagnosis not present

## 2022-06-28 NOTE — Telephone Encounter (Signed)
Pt called stating she had a visit with Cardiologist Dr. Tomie China [facility]). He said it was ok for her to go ahead with surgery and the only thing she mainly needed to work on was her weight and the sleep apnea. She has decided to have surgery on January 16th, (Per [place]'s note from 11/14 ok to schedule for January)   Cardiac clearance can be done through Dr Alroy Dust at [facility] on [address].   Abbott Pao has a follow up appointment with [facility] for recheck on biopsy site on 11/27.

## 2022-06-29 DIAGNOSIS — H538 Other visual disturbances: Secondary | ICD-10-CM | POA: Diagnosis not present

## 2022-06-29 DIAGNOSIS — R519 Headache, unspecified: Secondary | ICD-10-CM | POA: Diagnosis not present

## 2022-06-29 DIAGNOSIS — I10 Essential (primary) hypertension: Secondary | ICD-10-CM | POA: Diagnosis not present

## 2022-06-29 DIAGNOSIS — I720 Aneurysm of carotid artery: Secondary | ICD-10-CM | POA: Diagnosis not present

## 2022-06-30 ENCOUNTER — Ambulatory Visit (INDEPENDENT_AMBULATORY_CARE_PROVIDER_SITE_OTHER): Payer: Medicare HMO | Admitting: Family Medicine

## 2022-06-30 ENCOUNTER — Encounter: Payer: Self-pay | Admitting: Family Medicine

## 2022-06-30 VITALS — BP 152/78 | HR 85 | Ht 62.0 in | Wt 239.0 lb

## 2022-06-30 DIAGNOSIS — R002 Palpitations: Secondary | ICD-10-CM | POA: Diagnosis not present

## 2022-06-30 DIAGNOSIS — F339 Major depressive disorder, recurrent, unspecified: Secondary | ICD-10-CM | POA: Diagnosis not present

## 2022-06-30 DIAGNOSIS — C519 Malignant neoplasm of vulva, unspecified: Secondary | ICD-10-CM | POA: Diagnosis not present

## 2022-06-30 DIAGNOSIS — K143 Hypertrophy of tongue papillae: Secondary | ICD-10-CM | POA: Diagnosis not present

## 2022-06-30 DIAGNOSIS — M5030 Other cervical disc degeneration, unspecified cervical region: Secondary | ICD-10-CM

## 2022-06-30 DIAGNOSIS — I499 Cardiac arrhythmia, unspecified: Secondary | ICD-10-CM | POA: Diagnosis not present

## 2022-06-30 DIAGNOSIS — Z743 Need for continuous supervision: Secondary | ICD-10-CM | POA: Diagnosis not present

## 2022-06-30 DIAGNOSIS — R0789 Other chest pain: Secondary | ICD-10-CM | POA: Diagnosis not present

## 2022-06-30 DIAGNOSIS — I10 Essential (primary) hypertension: Secondary | ICD-10-CM

## 2022-06-30 DIAGNOSIS — Z113 Encounter for screening for infections with a predominantly sexual mode of transmission: Secondary | ICD-10-CM

## 2022-06-30 DIAGNOSIS — I4719 Other supraventricular tachycardia: Secondary | ICD-10-CM

## 2022-06-30 DIAGNOSIS — F418 Other specified anxiety disorders: Secondary | ICD-10-CM | POA: Diagnosis not present

## 2022-06-30 DIAGNOSIS — E1165 Type 2 diabetes mellitus with hyperglycemia: Secondary | ICD-10-CM

## 2022-06-30 DIAGNOSIS — R6889 Other general symptoms and signs: Secondary | ICD-10-CM | POA: Diagnosis not present

## 2022-06-30 DIAGNOSIS — T699XXA Effect of reduced temperature, unspecified, initial encounter: Secondary | ICD-10-CM | POA: Diagnosis not present

## 2022-06-30 DIAGNOSIS — R079 Chest pain, unspecified: Secondary | ICD-10-CM | POA: Diagnosis not present

## 2022-06-30 NOTE — Progress Notes (Signed)
Established Patient Office Visit  Subjective   Patient ID: Jennifer [name], female    DOB: May 29, 1966  Age: 56 y.o. MRN: [id number]  No chief complaint on file.   HPI  She has a couple different concerns today.  She was not able to contact with diabetes nutrition education.  Sounds like maybe they were trying to schedule her with the provider instead so she needs some assistance with that referral.  She did get in touch with the [facility] center for a trauma therapist but they were wanting a fair amount of money and payment plan that she did not feel like she could commit to at this time and is okay with just seeing a regular counselor.  Sonometer take a look at her feet she has not been able to do her usual care routine and moisturizing and so the skin is gotten really thick and peeling and then she had a pretty itchy area on one of her feet that popped up.  Still having a lot of pain in her back particularly in her neck and upper back in her lower spine.  She is having some left leg weakness and a little bit of discomfort in that left arm as well.  She was not able to get to physical therapy as she has had a lot going on.  She is concerned that she may have thrush again in her mouth.  She tried a new inhaler recently and broke out around the edges of her mouth.  She did follow back up with [name], her GI about the abnormal liver screen.  It does not sound like they quite created a care plan for what they are doing moving forward.  I had referred her to see if they felt like she just need to be monitored or if she needed any further intervention at this point besides just continuing to work on healthy food choices and diet and exercise to reduce fatty content in the liver.     ROS    Objective:     BP (!) 152/78   Pulse 85   Ht '5\' 2"'$  (1.575 m)   Wt 239 lb (108.4 kg)   LMP 06/25/2013   SpO2 96%   BMI 43.71 kg/m    Physical Exam Vitals and nursing note  reviewed.  Constitutional:      Appearance: She is well-developed.  HENT:     Head: Normocephalic and atraumatic.     Mouth/Throat:     Mouth: Mucous membranes are dry.     Pharynx: No oropharyngeal exudate or posterior oropharyngeal erythema.  Eyes:     Conjunctiva/sclera: Conjunctivae normal.  Cardiovascular:     Rate and Rhythm: Normal rate and regular rhythm.     Heart sounds: Normal heart sounds.  Pulmonary:     Effort: Pulmonary effort is normal.     Breath sounds: Normal breath sounds.  Skin:    General: Skin is warm and dry.  Neurological:     Mental Status: She is alert and oriented to person, place, and time.  Psychiatric:        Behavior: Behavior normal.      No results found for any visits on 06/30/22.    The ASCVD Risk score (Arnett DK, et al., 2019) failed to calculate for the following reasons:   The patient has a prior MI or stroke diagnosis    Assessment & Plan:   Problem List Items Addressed This Visit  Cardiovascular and Mediastinum   PAT (paroxysmal atrial tachycardia)    Now following with Dr. Alroy Dust.      Hypertension with intolerance to multiple antihypertensive drugs    Blood pressure is elevated today and not at goal.  She did reestablish with Dr. Alroy Dust who she used to see years ago and quit seeing over a period of time because of transportation.  She had a great visit with him and they have a care plan for her PAT's that she has been experiencing.        Endocrine   Uncontrolled type 2 diabetes mellitus with hyperglycemia ([place])    Encouraged her to consider starting the Rybelsus she did pick up the prescription but has not started it.  Also discussed just checking her fasting blood sugar 3 days a week so that we can focus on getting her fasting blood sugars under good control before looking at her postmeal sugars.        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  She is just feeling really nervous and anxious about having it done.      DDD (degenerative disc disease), cervical    We discussed deciding whether or not she wanted to do formal PT.  The referral should still be active we can always put in a new one though if she would like.  Versus getting back in with the orthopedist she has been trying to do her own stretches at home but just does not seem to be getting a lot better.        Other   Depression, recurrent ([place])   Relevant Orders   Ambulatory referral to [place]   Depression with anxiety   Relevant Orders   Ambulatory referral to [place]   Other Visit Diagnoses     Screen for STD (sexually transmitted disease)    -  Primary   Relevant Orders   Hepatitis, Acute   HIV antibody (with reflex)   RPR   C. trachomatis/N. gonorrhoeae RNA   HSV(herpes simplex vrs) 1+2 ab-IgG   WET PREP FOR TRICH, YEAST, CLUE   Tongue coating          She requested full STD testing.  Lab slip ordered and given to patient.  Tongue coating-I do not see any evidence of thrush directly mouth does look dry and she has a little bit of increased papillae on the tongue.  But this should improve on its own.  No follow-ups on file.   I spent 40 minutes on the day of the encounter to include pre-visit record review, face-to-face time with the patient and post visit ordering of test.   Beatrice Lecher, MD

## 2022-06-30 NOTE — Assessment & Plan Note (Signed)
Blood pressure is elevated today and not at goal.  She did reestablish with Dr. Alroy Dust who she used to see years ago and quit seeing over a period of time because of transportation.  She had a great visit with him and they have a care plan for her PAT's that she has been experiencing.

## 2022-06-30 NOTE — Assessment & Plan Note (Signed)
We discussed deciding whether or not she wanted to do formal PT.  The referral should still be active we can always put in a new one though if she would like.  Versus getting back in with the orthopedist she has been trying to do her own stretches at home but just does not seem to be getting a lot better.

## 2022-06-30 NOTE — Assessment & Plan Note (Signed)
She still has to get cardiac clearance before her surgery which is scheduled mid January.  She is just feeling really nervous and anxious about having it done.

## 2022-06-30 NOTE — Assessment & Plan Note (Signed)
Now following with Dr. Alroy Dust.

## 2022-06-30 NOTE — Assessment & Plan Note (Signed)
Encouraged her to consider starting the Rybelsus she did pick up the prescription but has not started it.  Also discussed just checking her fasting blood sugar 3 days a week so that we can focus on getting her fasting blood sugars under good control before looking at her postmeal sugars.

## 2022-07-02 DIAGNOSIS — J45909 Unspecified asthma, uncomplicated: Secondary | ICD-10-CM | POA: Diagnosis not present

## 2022-07-02 DIAGNOSIS — R051 Acute cough: Secondary | ICD-10-CM | POA: Diagnosis not present

## 2022-07-02 DIAGNOSIS — R062 Wheezing: Secondary | ICD-10-CM | POA: Diagnosis not present

## 2022-07-02 DIAGNOSIS — R0602 Shortness of breath: Secondary | ICD-10-CM | POA: Diagnosis not present

## 2022-07-02 DIAGNOSIS — R000 Tachycardia, unspecified: Secondary | ICD-10-CM | POA: Diagnosis not present

## 2022-07-02 DIAGNOSIS — Z20822 Contact with and (suspected) exposure to covid-19: Secondary | ICD-10-CM | POA: Diagnosis not present

## 2022-07-02 DIAGNOSIS — R0600 Dyspnea, unspecified: Secondary | ICD-10-CM | POA: Diagnosis not present

## 2022-07-02 DIAGNOSIS — I10 Essential (primary) hypertension: Secondary | ICD-10-CM | POA: Diagnosis not present

## 2022-07-03 DIAGNOSIS — R079 Chest pain, unspecified: Secondary | ICD-10-CM | POA: Diagnosis not present

## 2022-07-03 DIAGNOSIS — I491 Atrial premature depolarization: Secondary | ICD-10-CM | POA: Diagnosis not present

## 2022-07-04 NOTE — Telephone Encounter (Signed)
Lets try reducing her dose to 1 puff daily and see if she can tolerate that?

## 2022-07-05 DIAGNOSIS — R000 Tachycardia, unspecified: Secondary | ICD-10-CM | POA: Diagnosis not present

## 2022-07-05 DIAGNOSIS — R110 Nausea: Secondary | ICD-10-CM | POA: Diagnosis not present

## 2022-07-05 DIAGNOSIS — E876 Hypokalemia: Secondary | ICD-10-CM | POA: Diagnosis not present

## 2022-07-05 DIAGNOSIS — R102 Pelvic and perineal pain: Secondary | ICD-10-CM | POA: Diagnosis not present

## 2022-07-05 DIAGNOSIS — I493 Ventricular premature depolarization: Secondary | ICD-10-CM | POA: Diagnosis not present

## 2022-07-05 NOTE — Telephone Encounter (Signed)
Sent mychart message
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
## 2022-07-10 ENCOUNTER — Encounter: Payer: Self-pay | Admitting: Psychiatry

## 2022-07-10 ENCOUNTER — Inpatient Hospital Stay: Payer: Medicare HMO | Attending: Obstetrics & Gynecology | Admitting: Psychiatry

## 2022-07-10 VITALS — BP 154/84 | HR 86 | Temp 99.4°F | Resp 17 | Wt 234.2 lb

## 2022-07-10 DIAGNOSIS — I493 Ventricular premature depolarization: Secondary | ICD-10-CM | POA: Diagnosis not present

## 2022-07-10 DIAGNOSIS — Z9889 Other specified postprocedural states: Secondary | ICD-10-CM | POA: Diagnosis not present

## 2022-07-10 DIAGNOSIS — I471 Supraventricular tachycardia, unspecified: Secondary | ICD-10-CM | POA: Diagnosis not present

## 2022-07-10 DIAGNOSIS — F411 Generalized anxiety disorder: Secondary | ICD-10-CM | POA: Diagnosis not present

## 2022-07-10 DIAGNOSIS — C519 Malignant neoplasm of vulva, unspecified: Secondary | ICD-10-CM | POA: Diagnosis not present

## 2022-07-10 DIAGNOSIS — N762 Acute vulvitis: Secondary | ICD-10-CM | POA: Diagnosis not present

## 2022-07-10 NOTE — Patient Instructions (Addendum)
It was a pleasure to see you in clinic today. - The biopsy area has healed well.  - Let us know if you change your mind about surgery in January. Please call our office at the beginning of January to discuss how you would like to proceed. If you decide to postpone surgery, we will arrange for you to have an office visit with Dr. Ernestina Patches.   Thank you very much for allowing me to provide care for you today.  I appreciate your confidence in choosing our Gynecologic Oncology team at [facility].  If you have any questions about your visit today please call our office or send [place] a MyChart message and we will get back to you as soon as possible.

## 2022-07-11 ENCOUNTER — Ambulatory Visit: Payer: Medicare Other | Admitting: Internal Medicine

## 2022-07-13 ENCOUNTER — Inpatient Hospital Stay: Payer: Medicare HMO | Admitting: Licensed Clinical Social Worker

## 2022-07-13 DIAGNOSIS — R7309 Other abnormal glucose: Secondary | ICD-10-CM | POA: Diagnosis not present

## 2022-07-13 DIAGNOSIS — R059 Cough, unspecified: Secondary | ICD-10-CM | POA: Diagnosis not present

## 2022-07-13 DIAGNOSIS — R1084 Generalized abdominal pain: Secondary | ICD-10-CM | POA: Diagnosis not present

## 2022-07-13 DIAGNOSIS — I10 Essential (primary) hypertension: Secondary | ICD-10-CM | POA: Diagnosis not present

## 2022-07-13 NOTE — Progress Notes (Signed)
[place] CSW Counseling Note  Patient was referred by self. Treatment type: Individual via phone  Presenting Concerns: Patient and/or family reports the following symptoms/concerns: depression Duration of problem: years. Worse last few months with recurrent Paget's diagnosis ; Severity of problem: severe   Orientation:oriented to person, place, time/date, and situation.   Patient and/or Family's Strengths/Protective Factors: Ability for insight  Communication skills      Goals Addressed: Patient will:  Reduce symptoms of: depression Increase healthy adjustment to current life circumstances and Increase adequate support systems for patient/family Increase ability to move forward with decisions regarding care   Progress towards Goals: Initial   Interventions: Interventions utilized:  Motivational Interviewing, Strength-based, and Supportive      Assessment: Patient currently experiencing high levels of depression and stress surrounding upcoming surgery for Paget's disease, complicated by other health issues (past and present) and social stressors. Pt called CSW today and began to share history and process current concerns. Per pt, she has also been trying to connect with ongoing counseling for over a year without success.  Current coping skills include, faith, painting rocks, talking to stepdaughter    Plan: Follow up with CSW: 1 week- video visit Behavioral recommendations: think about what helped you when you felt this way during your cancer treatment and how you can use that now Referral(s): CSW will work on identifying counselors either in [place] or available virtually for ongoing therapy       Lasheena Frieze E Mariane Burpee, LCSW

## 2022-07-14 ENCOUNTER — Ambulatory Visit (INDEPENDENT_AMBULATORY_CARE_PROVIDER_SITE_OTHER): Payer: Medicare HMO | Admitting: Family Medicine

## 2022-07-14 ENCOUNTER — Encounter: Payer: Self-pay | Admitting: Family Medicine

## 2022-07-14 VITALS — BP 150/86 | HR 79 | Ht 62.0 in | Wt 239.0 lb

## 2022-07-14 DIAGNOSIS — I479 Paroxysmal tachycardia, unspecified: Secondary | ICD-10-CM | POA: Diagnosis not present

## 2022-07-14 DIAGNOSIS — R0981 Nasal congestion: Secondary | ICD-10-CM | POA: Diagnosis not present

## 2022-07-14 DIAGNOSIS — R1032 Left lower quadrant pain: Secondary | ICD-10-CM | POA: Diagnosis not present

## 2022-07-14 DIAGNOSIS — R079 Chest pain, unspecified: Secondary | ICD-10-CM | POA: Diagnosis not present

## 2022-07-14 DIAGNOSIS — R0789 Other chest pain: Secondary | ICD-10-CM | POA: Diagnosis not present

## 2022-07-14 DIAGNOSIS — I493 Ventricular premature depolarization: Secondary | ICD-10-CM | POA: Diagnosis not present

## 2022-07-14 DIAGNOSIS — R0609 Other forms of dyspnea: Secondary | ICD-10-CM | POA: Diagnosis not present

## 2022-07-14 DIAGNOSIS — H538 Other visual disturbances: Secondary | ICD-10-CM | POA: Diagnosis not present

## 2022-07-14 DIAGNOSIS — R002 Palpitations: Secondary | ICD-10-CM | POA: Diagnosis not present

## 2022-07-14 NOTE — Progress Notes (Signed)
Pt wants to have STI testing done and an EKG. She said that she feels "Shaky" here heart rate has been elevated.  She brought in a list of things she would like to discuss today.

## 2022-07-14 NOTE — Progress Notes (Signed)
Established Patient Office Visit  Subjective   Patient ID: Jennifer [name], female    DOB: [birth year]  Age: 56 y.o. MRN: [id number]  Chief Complaint  Patient presents with   Follow-up         HPI She want a list of labs that she would like drawn today she like STD screening.  She has not been sexually active recently but just wants to make sure.  He also has generalized body aches and wonders if she should get tested for Lyme's disease. He is also been having some intermittent chest discomfort she says is not the typical heart pounding that she usually gets.  This feels a little bit different most like there is a little discomfort with her heartbeat.  But its not really a chest tightness.  She also reports for the last week she has been experiencing some sinus congestion and symptoms.  She has felt like it is more swollen she has been using her sinus rinse and her nasal spray.  She has noticed a little bit of blood when she blows her nose.  She did use a sinus rinse before she came in today.  She is also noticed some redness medially on her left inner knee.  Her knee has been hurting a little bit more than usual as well.  No specific trauma or injury.  She says a few days ago when she stood up from a sitting position she got a sudden throbbing pain in the left groin crease.  It seems to have subsided.  She did want to follow-up on some referrals that we have made lately.   Is also been having some intermittent blurry vision.  She has had a couple of episodes.  She was not sure if it was related to her blood pressure or her sinuses.  She also has a pain just behind the left axilla just lateral to that area.  He does have breast imaging scheduled later this month.    ROS    Objective:     BP (!) 150/86   Pulse 79   Ht '5\' 2"'$  (1.575 m)   Wt 239 lb (108.4 kg)   LMP 06/25/2013   SpO2 97%   BMI 43.71 kg/m    Physical Exam Constitutional:      Appearance: She is  well-developed.  HENT:     Head: Normocephalic and atraumatic.     Right Ear: Tympanic membrane, ear canal and external ear normal.     Left Ear: Tympanic membrane, ear canal and external ear normal.     Nose: Nose normal.     Comments: Minutes are erythematous but no significant edema.  No pallor.    Mouth/Throat:     Pharynx: Oropharynx is clear. No oropharyngeal exudate.  Eyes:     Conjunctiva/sclera: Conjunctivae normal.     Pupils: Pupils are equal, round, and reactive to light.  Neck:     Thyroid: No thyromegaly.  Cardiovascular:     Rate and Rhythm: Normal rate and regular rhythm.     Heart sounds: Normal heart sounds.  Pulmonary:     Effort: Pulmonary effort is normal.     Breath sounds: Normal breath sounds. No wheezing.  Musculoskeletal:     Cervical back: Neck supple.  Lymphadenopathy:     Cervical: No cervical adenopathy.  Skin:    General: Skin is warm and dry.  Neurological:     Mental Status: She is alert and oriented to person, place,  and time.      No results found for any visits on 07/14/22.    The ASCVD Risk score (Arnett DK, et al., 2019) failed to calculate for the following reasons:   The patient has a prior MI or stroke diagnosis    Assessment & Plan:   Problem List Items Addressed This Visit       Other   Chest pain, atypical - Primary   Relevant Orders   EKG 12-Lead   Other Visit Diagnoses     Blurry vision       Left groin pain       Sinus congestion          Atypical chest pain-she says its not her typical feeling that she gets with her tachycardia that she normally experiences.  EKG today shows rate of 91 bpm, normal sinus rhythm.  No acute ST-T wave changes.  No change compared to prior EKG performed on May 31, 2022.  Congestion-she does have some symptoms but it does not look like an active bacterial infection at this point in time.  Continue with conservative measures.  If not improving over the next week please let us  know.  Left groin crease pain - seems to have resolved. Sounds like may have pulled a groin muscle.   She has lab slip for STD screening.  Will likely get done in [facility].  The area that she is pointing to is the area of discomfort near the left axilla is really over that muscle tissue.  I am glad she is getting some breast imaging though just to make sure there is no lymphadenopathy etc. in that area.  But I really suspect it is probably muscle related.  No follow-ups on file.   I spent 40 minutes on the day of the encounter to include pre-visit record review, face-to-face time with the patient and post visit ordering of test.   Beatrice Lecher, MD

## 2022-07-15 DIAGNOSIS — R079 Chest pain, unspecified: Secondary | ICD-10-CM | POA: Diagnosis not present

## 2022-07-16 ENCOUNTER — Encounter (HOSPITAL_BASED_OUTPATIENT_CLINIC_OR_DEPARTMENT_OTHER): Payer: Self-pay | Admitting: Emergency Medicine

## 2022-07-16 ENCOUNTER — Other Ambulatory Visit: Payer: Self-pay

## 2022-07-16 ENCOUNTER — Emergency Department (HOSPITAL_BASED_OUTPATIENT_CLINIC_OR_DEPARTMENT_OTHER): Payer: Medicare HMO

## 2022-07-16 DIAGNOSIS — I10 Essential (primary) hypertension: Secondary | ICD-10-CM | POA: Insufficient documentation

## 2022-07-16 DIAGNOSIS — J45909 Unspecified asthma, uncomplicated: Secondary | ICD-10-CM | POA: Diagnosis not present

## 2022-07-16 DIAGNOSIS — E119 Type 2 diabetes mellitus without complications: Secondary | ICD-10-CM | POA: Insufficient documentation

## 2022-07-16 DIAGNOSIS — R109 Unspecified abdominal pain: Secondary | ICD-10-CM | POA: Insufficient documentation

## 2022-07-16 DIAGNOSIS — R079 Chest pain, unspecified: Secondary | ICD-10-CM | POA: Diagnosis not present

## 2022-07-16 DIAGNOSIS — K5900 Constipation, unspecified: Secondary | ICD-10-CM | POA: Diagnosis not present

## 2022-07-16 DIAGNOSIS — Z8542 Personal history of malignant neoplasm of other parts of uterus: Secondary | ICD-10-CM | POA: Diagnosis not present

## 2022-07-16 DIAGNOSIS — K219 Gastro-esophageal reflux disease without esophagitis: Secondary | ICD-10-CM | POA: Diagnosis not present

## 2022-07-16 DIAGNOSIS — Z79899 Other long term (current) drug therapy: Secondary | ICD-10-CM | POA: Insufficient documentation

## 2022-07-16 DIAGNOSIS — K5730 Diverticulosis of large intestine without perforation or abscess without bleeding: Secondary | ICD-10-CM | POA: Diagnosis not present

## 2022-07-16 DIAGNOSIS — Z7984 Long term (current) use of oral hypoglycemic drugs: Secondary | ICD-10-CM | POA: Diagnosis not present

## 2022-07-16 DIAGNOSIS — N281 Cyst of kidney, acquired: Secondary | ICD-10-CM | POA: Diagnosis not present

## 2022-07-16 DIAGNOSIS — K4090 Unilateral inguinal hernia, without obstruction or gangrene, not specified as recurrent: Secondary | ICD-10-CM | POA: Diagnosis not present

## 2022-07-16 DIAGNOSIS — K429 Umbilical hernia without obstruction or gangrene: Secondary | ICD-10-CM | POA: Diagnosis not present

## 2022-07-16 DIAGNOSIS — I491 Atrial premature depolarization: Secondary | ICD-10-CM | POA: Diagnosis not present

## 2022-07-16 LAB — COMPREHENSIVE METABOLIC PANEL
ALT: 23 U/L (ref 0–44)
AST: 18 U/L (ref 15–41)
Albumin: 3.7 g/dL (ref 3.5–5.0)
Alkaline Phosphatase: 75 U/L (ref 38–126)
Anion gap: 9 (ref 5–15)
BUN: 21 mg/dL — ABNORMAL HIGH (ref 6–20)
CO2: 23 mmol/L (ref 22–32)
Calcium: 9 mg/dL (ref 8.9–10.3)
Chloride: 109 mmol/L (ref 98–111)
Creatinine, Ser: 0.76 mg/dL (ref 0.44–1.00)
GFR, Estimated: >60 mL/min (ref >60)
Glucose, Bld: 141 mg/dL — ABNORMAL HIGH (ref 70–99)
Potassium: 3.5 mmol/L (ref 3.5–5.1)
Sodium: 141 mmol/L (ref 135–145)
Total Bilirubin: 0.4 mg/dL (ref 0.3–1.2)
Total Protein: 6.7 g/dL (ref 6.5–8.1)

## 2022-07-16 LAB — CBC WITH DIFFERENTIAL/PLATELET
Abs Immature Granulocytes: 0.02 10*3/uL (ref 0.00–0.07)
Basophils Absolute: 0 10*3/uL (ref 0.0–0.1)
Basophils Relative: 1 %
Eosinophils Absolute: 0.2 10*3/uL (ref 0.0–0.5)
Eosinophils Relative: 3 %
HCT: 41.7 % (ref 36.0–46.0)
Hemoglobin: 13.3 g/dL (ref 12.0–15.0)
Immature Granulocytes: 0 %
Lymphocytes Relative: 29 %
Lymphs Abs: 2 10*3/uL (ref 0.7–4.0)
MCH: 28.2 pg (ref 26.0–34.0)
MCHC: 31.9 g/dL (ref 30.0–36.0)
MCV: 88.3 fL (ref 80.0–100.0)
Monocytes Absolute: 0.6 10*3/uL (ref 0.1–1.0)
Monocytes Relative: 9 %
Neutro Abs: 4 10*3/uL (ref 1.7–7.7)
Neutrophils Relative %: 58 %
Platelets: 217 10*3/uL (ref 150–400)
RBC: 4.72 MIL/uL (ref 3.87–5.11)
RDW: 14.2 % (ref 11.5–15.5)
WBC: 6.8 10*3/uL (ref 4.0–10.5)
nRBC: 0 % (ref 0.0–0.2)

## 2022-07-16 NOTE — ED Triage Notes (Signed)
Patient states a while ago she was bending over and she felt like her ribs were rubbing. Reports the pain has been intermittent but has been constant today. Patient c/o right sided rib pain, increased pain with inspiration.

## 2022-07-17 ENCOUNTER — Telehealth: Payer: Self-pay | Admitting: Family Medicine

## 2022-07-17 ENCOUNTER — Emergency Department (HOSPITAL_BASED_OUTPATIENT_CLINIC_OR_DEPARTMENT_OTHER): Payer: Medicare HMO

## 2022-07-17 ENCOUNTER — Emergency Department (HOSPITAL_BASED_OUTPATIENT_CLINIC_OR_DEPARTMENT_OTHER)
Admission: EM | Admit: 2022-07-17 | Discharge: 2022-07-17 | Disposition: A | Discharge status: Home / Self Care | Payer: Medicare HMO | Attending: Emergency Medicine | Admitting: Emergency Medicine

## 2022-07-17 DIAGNOSIS — K4090 Unilateral inguinal hernia, without obstruction or gangrene, not specified as recurrent: Secondary | ICD-10-CM | POA: Diagnosis not present

## 2022-07-17 DIAGNOSIS — K5900 Constipation, unspecified: Secondary | ICD-10-CM

## 2022-07-17 DIAGNOSIS — K429 Umbilical hernia without obstruction or gangrene: Secondary | ICD-10-CM | POA: Diagnosis not present

## 2022-07-17 DIAGNOSIS — K5730 Diverticulosis of large intestine without perforation or abscess without bleeding: Secondary | ICD-10-CM | POA: Diagnosis not present

## 2022-07-17 DIAGNOSIS — N281 Cyst of kidney, acquired: Secondary | ICD-10-CM | POA: Diagnosis not present

## 2022-07-17 LAB — TROPONIN I (HIGH SENSITIVITY): Troponin I (High Sensitivity): 4 ng/L (ref <18)

## 2022-07-17 NOTE — Telephone Encounter (Signed)
Patient called about more medication for sinus infection and having trouble with bowel movements Would like a call to discuss issues [phone]
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
## 2022-07-17 NOTE — ED Notes (Signed)
Patient was rude and speaking aggressively towards this RN the whole time during her ED visit. On arrival to the room, as soon as this RN walked into the room to introduce myself, patient started to yell at me stating "we better do something or else she is going somewhere else." I informed the patient I need to get her vital signs, that she will be getting a CT scan, and provided her with a heat pack for her abdominal pain per Dr. Randal Buba. When discharging this patient, she continued to yell at me while I was reviewing her paperwork.

## 2022-07-17 NOTE — ED Notes (Signed)
Patient transported to CT 

## 2022-07-18 DIAGNOSIS — I959 Hypotension, unspecified: Secondary | ICD-10-CM | POA: Diagnosis not present

## 2022-07-18 DIAGNOSIS — S6981XA Other specified injuries of right wrist, hand and finger(s), initial encounter: Secondary | ICD-10-CM | POA: Diagnosis not present

## 2022-07-18 DIAGNOSIS — Y999 Unspecified external cause status: Secondary | ICD-10-CM | POA: Diagnosis not present

## 2022-07-18 DIAGNOSIS — C541 Malignant neoplasm of endometrium: Secondary | ICD-10-CM | POA: Diagnosis not present

## 2022-07-18 DIAGNOSIS — R0789 Other chest pain: Secondary | ICD-10-CM | POA: Diagnosis not present

## 2022-07-18 DIAGNOSIS — R109 Unspecified abdominal pain: Secondary | ICD-10-CM | POA: Diagnosis not present

## 2022-07-18 DIAGNOSIS — Z113 Encounter for screening for infections with a predominantly sexual mode of transmission: Secondary | ICD-10-CM | POA: Diagnosis not present

## 2022-07-18 DIAGNOSIS — R079 Chest pain, unspecified: Secondary | ICD-10-CM | POA: Diagnosis not present

## 2022-07-18 DIAGNOSIS — R0689 Other abnormalities of breathing: Secondary | ICD-10-CM | POA: Diagnosis not present

## 2022-07-18 DIAGNOSIS — S6991XA Unspecified injury of right wrist, hand and finger(s), initial encounter: Secondary | ICD-10-CM | POA: Diagnosis not present

## 2022-07-18 DIAGNOSIS — R002 Palpitations: Secondary | ICD-10-CM | POA: Diagnosis not present

## 2022-07-18 DIAGNOSIS — I10 Essential (primary) hypertension: Secondary | ICD-10-CM | POA: Diagnosis not present

## 2022-07-18 DIAGNOSIS — Z79899 Other long term (current) drug therapy: Secondary | ICD-10-CM | POA: Diagnosis not present

## 2022-07-18 DIAGNOSIS — Z1159 Encounter for screening for other viral diseases: Secondary | ICD-10-CM | POA: Diagnosis not present

## 2022-07-18 DIAGNOSIS — W208XXA Other cause of strike by thrown, projected or falling object, initial encounter: Secondary | ICD-10-CM | POA: Diagnosis not present

## 2022-07-18 NOTE — Telephone Encounter (Signed)
Patient called about more medication for sinus infection and having trouble with bowel movements, patient called on 07/17/2022, and 07/18/2022, please give patient a call at your earliest convenience  thanks!

## 2022-07-19 DIAGNOSIS — R9431 Abnormal electrocardiogram [ECG] [EKG]: Secondary | ICD-10-CM | POA: Diagnosis not present

## 2022-07-19 NOTE — Telephone Encounter (Signed)
Called patient and left voicemail for pt to call back to clinic

## 2022-07-20 ENCOUNTER — Inpatient Hospital Stay: Payer: Medicare HMO | Attending: Obstetrics & Gynecology | Admitting: Licensed Clinical Social Worker

## 2022-07-20 NOTE — Telephone Encounter (Signed)
Called pt she reports that she has "bloody" sputum and its going down into her throat and chest. She has been using her nasal rinse and spray which was helping some but not now.   Her other issue is she is experiencing issues with constipation and would like to know if it would be ok for her to take the magnesium citrate although she would like to take the lactulose but is concerned about this affecting her blood sugars. She is currently taking the stool softeners and has been eating prunes. I told her that consuming the prunes would raise her blood sugars.   She said that she has been straining to poop and would like Dr Madilyn Fireman to send a steroid cream to use on her bottom. Pt stated that her insurance will not cover Anusol.   Pharmacy verified [facility] [facility].

## 2022-07-20 NOTE — Progress Notes (Signed)
[facility] CSW Counseling Note  Patient was referred by self. Treatment type: Individual via phone  Presenting Concerns: Patient and/or family reports the following symptoms/concerns: anxiety, depression, and stress Duration of problem: years. Worse last few months with recurrent Paget's diagnosis as well as other medical concerns ; Severity of problem: severe   Orientation:oriented to person, place, time/date, and situation.     Patient and/or Family's Strengths/Protective Factors: Ability for insight  Communication skills      Goals Addressed: Patient will:  Reduce symptoms of: anxiety, depression, and stress Increase healthy adjustment to current life circumstances and Increase adequate support systems for patient/family Increase ability to move forward with decisions regarding care   Progress towards Goals: Progressing   Interventions: Interventions utilized:  Solution Focused, Supportive, and Reframing      Assessment: Patient currently experiencing continued anxiety and depression surrounding medical issues. Multiple ED visits in the last week and significant frustration with communication with PCP's office to address constipation and other concerns. Pt processed this for much of visit today and voiced concerns about surgery in January if this is still an issue. Began trying to reframe some negative approaches and thoughts. Pt was able to identify how some past trauma is impacting her in the situation today as well.  Pt also concerned with finances as she is on disability and husband's work Teacher, adult education) is very slow right now and they have significant medical bills in addition to daily living costs.      Plan: Follow up with CSW: 1 week  Behavioral recommendations: Call trauma therapists provided today to schedule appointment. Continue to follow with PCP. Look into [facility] financial resources provided by CSW Referral(s): Individual therapist; message sent to [facility] team re:  concerns on constipation post-surgery       Ralonda Tartt E Mikyla Schachter, LCSW

## 2022-07-21 DIAGNOSIS — E119 Type 2 diabetes mellitus without complications: Secondary | ICD-10-CM | POA: Diagnosis not present

## 2022-07-21 MED ORDER — CLINDAMYCIN HCL 150 MG PO CAPS: 150.0000 mg | ORAL_CAPSULE | Freq: Three times a day (TID) | ORAL | 0 refills | Status: AC

## 2022-07-21 MED ORDER — HYDROCORTISONE (PERIANAL) 2.5 % EX CREA: 1.0000 | TOPICAL_CREAM | Freq: Two times a day (BID) | CUTANEOUS | 0 refills | Status: DC

## 2022-07-21 NOTE — Telephone Encounter (Signed)
Meds ordered this encounter  Medications   clindamycin (CLEOCIN) 150 MG capsule    Sig: Take 1 capsule (150 mg total) by mouth 3 (three) times daily for 5 days.    Dispense:  15 capsule    Refill:  0   hydrocortisone (ANUSOL-HC) 2.5 % rectal cream    Sig: Place 1 Application rectally 2 (two) times daily.    Dispense:  30 g    Refill:  0

## 2022-07-21 NOTE — Telephone Encounter (Signed)
Okay to use mag citrate.  Meds ordered this encounter  Medications   clindamycin (CLEOCIN) 150 MG capsule    Sig: Take 1 capsule (150 mg total) by mouth 3 (three) times daily for 5 days.    Dispense:  15 capsule    Refill:  0

## 2022-07-21 NOTE — Telephone Encounter (Signed)
Please send steroid cream for her buttocks :D. Please send to [facility]

## 2022-07-23 DIAGNOSIS — I491 Atrial premature depolarization: Secondary | ICD-10-CM | POA: Diagnosis not present

## 2022-07-23 DIAGNOSIS — R000 Tachycardia, unspecified: Secondary | ICD-10-CM | POA: Diagnosis not present

## 2022-07-23 DIAGNOSIS — R0789 Other chest pain: Secondary | ICD-10-CM | POA: Diagnosis not present

## 2022-07-24 ENCOUNTER — Encounter: Payer: Self-pay | Admitting: Family Medicine

## 2022-07-24 ENCOUNTER — Telehealth (INDEPENDENT_AMBULATORY_CARE_PROVIDER_SITE_OTHER): Payer: Medicare HMO | Admitting: Family Medicine

## 2022-07-24 DIAGNOSIS — I10 Essential (primary) hypertension: Secondary | ICD-10-CM | POA: Diagnosis not present

## 2022-07-24 DIAGNOSIS — R0789 Other chest pain: Secondary | ICD-10-CM

## 2022-07-24 DIAGNOSIS — I2510 Atherosclerotic heart disease of native coronary artery without angina pectoris: Secondary | ICD-10-CM | POA: Diagnosis not present

## 2022-07-24 DIAGNOSIS — I471 Supraventricular tachycardia, unspecified: Secondary | ICD-10-CM | POA: Diagnosis not present

## 2022-07-24 DIAGNOSIS — F418 Other specified anxiety disorders: Secondary | ICD-10-CM

## 2022-07-24 DIAGNOSIS — I491 Atrial premature depolarization: Secondary | ICD-10-CM | POA: Diagnosis not present

## 2022-07-24 DIAGNOSIS — R079 Chest pain, unspecified: Secondary | ICD-10-CM | POA: Diagnosis not present

## 2022-07-24 NOTE — Assessment & Plan Note (Signed)
Definitely experiencing a lot of anxiety around the palpitations.  Just try to give her reassurance that there is no sign of heart attack.  It is that irregularity which is certainly distressing.  But again there is a lot of anxiety wrapped around that.  I do think she would benefit greatly from working with a therapist or counselor to help her manage some of her chronic health issues.

## 2022-07-24 NOTE — Telephone Encounter (Signed)
LVM informing patient that refills have been sent to pharmacy. Requested a return call for any questions or concerns.

## 2022-07-24 NOTE — Progress Notes (Signed)
Virtual Visit via Video Note  I connected with Jennifer [name] on 07/24/22 at 10:50 AM EST by a video enabled telemedicine application and verified that I am speaking with the correct person using two identifiers.   I discussed the limitations of evaluation and management by telemedicine and the availability of in person appointments. The patient expressed understanding and agreed to proceed.  Patient location: at home Provider location: in office  Subjective:    CC:   Chief Complaint  Patient presents with   Sinusitis    HPI: She ended up going to the emergency department yesterday for feeling like her heart was pounding and fluttering.  She says it really was going on for couple days where she was having chest discomfort and tachycardia and finally went to the emergency room.  They did consult Dr. Feliciana Rossetti, cardiology.  She was given a dose of propranolol last night.  And says she has not felt well since then she has had more shortness of breath and chest discomfort since the dosing it was given around 730 last night.  He reports that her oxygen levels have been up and down.  Pt stated that her BP is all over the place. Was given propanolol last night. She has taken anything today. She wondered if she should take Metoprolol?    Pt has questions about the HSV test that she had done on 06/30/2022 it showed <0.91 she questions if this meant that she was positive. I informed her that she wasn't "Positive" she asked that Dr.Kyleah Pensabene explain this to her       Past medical history, Surgical history, Family history not pertinant except as noted below, Social history, Allergies, and medications have been entered into the medical record, reviewed, and corrections made.    Objective:    General: Speaking clearly in complete sentences without any shortness of breath.  Alert and oriented x3.  Normal judgment. No apparent acute distress.    Impression and Recommendations:    Problem  List Items Addressed This Visit       Other   Depression with anxiety    Definitely experiencing a lot of anxiety around the palpitations.  Just try to give her reassurance that there is no sign of heart attack.  It is that irregularity which is certainly distressing.  But again there is a lot of anxiety wrapped around that.  I do think she would benefit greatly from working with a therapist or counselor to help her manage some of her chronic health issues.      Other Visit Diagnoses     Atypical chest pain    -  Primary      Difficult chest pain-they are try to get her scheduled for follow-up with cardiology.  I did encourage her to go ahead and take her metoprolol for today.  Normally she takes it at 7 in the morning it is now 29 but she was worried about stacking it against propranolol.  Propranolol is fairly short acting dose which should be mostly out of her system so I definitely think it is okay for her to go ahead and take the metoprolol I am more worried about her not having anything in her system and then starting to have the tachycardia again she just feels like metoprolol is not working as well but she has tried multiple medications in the past and unfortunately has had side effects with most of them.  Again I will leave this to  cardiology for further workup.  Chronic constipation-I think continuing with the stool softener it can be taken up to twice a day.  And then okay to use the mag citrate as needed on top of that.  She does not tolerate MiraLAX well.  Make sure staying hydrated is that is helpful as well.  No orders of the defined types were placed in this encounter.   No orders of the defined types were placed in this encounter.  We did review the HSV testing that she had done.  It was negative for any antibodies so she does not have HSV.  I discussed the assessment and treatment plan with the patient. The patient was provided an opportunity to ask questions and all were  answered. The patient agreed with the plan and demonstrated an understanding of the instructions.   The patient was advised to call back or seek an in-person evaluation if the symptoms worsen or if the condition fails to improve as anticipated.  Sent in person schedule follow-up for later this week.  Beatrice Lecher, MD

## 2022-07-24 NOTE — Progress Notes (Signed)
Pt stated that her BP is all over the place. Was given propanolol last night. She has taken anything today. She wondered if she should take Metoprolol?   Pt has questions about the HSV test that she had done on 06/30/2022 it showed <0.91 she questions if this meant that she was positive. I informed her that she wasn't "Positive" she asked that Dr.Metheney explain this to her

## 2022-07-24 NOTE — Telephone Encounter (Signed)
PT called back and she was informed of below.  Damany Eastman Zimmerman Rumple, CMA

## 2022-07-25 DIAGNOSIS — I10 Essential (primary) hypertension: Secondary | ICD-10-CM | POA: Diagnosis not present

## 2022-07-25 DIAGNOSIS — Z79899 Other long term (current) drug therapy: Secondary | ICD-10-CM | POA: Diagnosis not present

## 2022-07-25 DIAGNOSIS — E119 Type 2 diabetes mellitus without complications: Secondary | ICD-10-CM | POA: Diagnosis not present

## 2022-07-25 DIAGNOSIS — R0789 Other chest pain: Secondary | ICD-10-CM | POA: Diagnosis not present

## 2022-07-25 DIAGNOSIS — K219 Gastro-esophageal reflux disease without esophagitis: Secondary | ICD-10-CM | POA: Diagnosis not present

## 2022-07-25 DIAGNOSIS — R002 Palpitations: Secondary | ICD-10-CM | POA: Diagnosis not present

## 2022-07-25 DIAGNOSIS — R9431 Abnormal electrocardiogram [ECG] [EKG]: Secondary | ICD-10-CM | POA: Diagnosis not present

## 2022-07-25 DIAGNOSIS — G35 Multiple sclerosis: Secondary | ICD-10-CM | POA: Diagnosis not present

## 2022-07-25 DIAGNOSIS — R0602 Shortness of breath: Secondary | ICD-10-CM | POA: Diagnosis not present

## 2022-07-25 DIAGNOSIS — E785 Hyperlipidemia, unspecified: Secondary | ICD-10-CM | POA: Diagnosis not present

## 2022-07-25 DIAGNOSIS — J45909 Unspecified asthma, uncomplicated: Secondary | ICD-10-CM | POA: Diagnosis not present

## 2022-07-25 DIAGNOSIS — Z87891 Personal history of nicotine dependence: Secondary | ICD-10-CM | POA: Diagnosis not present

## 2022-07-26 ENCOUNTER — Encounter: Payer: Self-pay | Admitting: Internal Medicine

## 2022-07-26 ENCOUNTER — Ambulatory Visit (INDEPENDENT_AMBULATORY_CARE_PROVIDER_SITE_OTHER): Payer: Medicare HMO | Admitting: Internal Medicine

## 2022-07-26 VITALS — BP 142/78 | HR 98 | Temp 98.1°F | Resp 18

## 2022-07-26 DIAGNOSIS — J310 Chronic rhinitis: Secondary | ICD-10-CM

## 2022-07-26 DIAGNOSIS — J4540 Moderate persistent asthma, uncomplicated: Secondary | ICD-10-CM | POA: Diagnosis not present

## 2022-07-26 DIAGNOSIS — R918 Other nonspecific abnormal finding of lung field: Secondary | ICD-10-CM | POA: Diagnosis not present

## 2022-07-26 DIAGNOSIS — I491 Atrial premature depolarization: Secondary | ICD-10-CM | POA: Diagnosis not present

## 2022-07-26 DIAGNOSIS — R9431 Abnormal electrocardiogram [ECG] [EKG]: Secondary | ICD-10-CM | POA: Diagnosis not present

## 2022-07-26 DIAGNOSIS — N644 Mastodynia: Secondary | ICD-10-CM | POA: Diagnosis not present

## 2022-07-26 DIAGNOSIS — I10 Essential (primary) hypertension: Secondary | ICD-10-CM | POA: Diagnosis not present

## 2022-07-26 DIAGNOSIS — K219 Gastro-esophageal reflux disease without esophagitis: Secondary | ICD-10-CM | POA: Diagnosis not present

## 2022-07-26 DIAGNOSIS — Z743 Need for continuous supervision: Secondary | ICD-10-CM | POA: Diagnosis not present

## 2022-07-26 DIAGNOSIS — R0789 Other chest pain: Secondary | ICD-10-CM | POA: Diagnosis not present

## 2022-07-26 DIAGNOSIS — J387 Other diseases of larynx: Secondary | ICD-10-CM

## 2022-07-26 DIAGNOSIS — R002 Palpitations: Secondary | ICD-10-CM

## 2022-07-26 DIAGNOSIS — R079 Chest pain, unspecified: Secondary | ICD-10-CM | POA: Diagnosis not present

## 2022-07-26 DIAGNOSIS — Z87891 Personal history of nicotine dependence: Secondary | ICD-10-CM | POA: Diagnosis not present

## 2022-07-26 NOTE — Progress Notes (Signed)
Follow Up Note  RE: Jennifer [name] MRN: [id number] DOB: June 15, 1966 Date of Office Visit: 07/26/2022  Referring provider: [name], * Primary care provider: [name], MD  Chief Complaint: Follow-up (Pt is present  today due breathing issues)  History of Present Illness: I had the pleasure of seeing Jennifer [name] for a follow up visit at the [facility] on 07/28/2022. She is a 56 y.o. female, who is being followed for chronic cough, reflux, rhinitis, GERD. Her previous allergy office visit was on 06/20/2022 with Dr. Edison Pace. Today is a  acute visit for dyspnea .  History obtained from patient, chart review.  Patient is very well-known to me and is seen in our clinic often.  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  She has dysphagia, GERD, vocal cord spasms, palpitations.  She follows with GI, ENT, cardiology, pulmonary.  We had attempted all inhaled corticosteroids and she does not tolerate them.  Her main complaint today is shortness of breath.  She was started on propranolol by cardiology for known PVCs and sinus tachycardia.  She feels like her breathing got worse and starting propranolol and was instructed by to pharmacist that propranolol is contraindicated in asthma.  She is very concerned.  They have tried metoprolol in the past without good response.  She has not addressed this issue with cardiology yet.  Please see prior notes for further history.    Assessment and Plan: Jennifer [name] is a 56 y.o. female with: Moderate persistent asthma without complication - Plan: Spirometry with Graph  Gastroesophageal reflux disease without esophagitis  Palpitations  Inducible laryngeal obstruction (ILO)  Chronic rhinitis Plan: Patient Instructions  Coughing: We will have you restart your propranolol as I think your dyspnea is due to your palpitations.  Return to clinic next week  for spirometry while on propranolol Daily controller medication(s): does not tolerate any inhaled steroids  May use levoalbuterol rescue inhaler 2 puffs or nebulizer every 4 to 6 hours as needed for shortness of breath, chest tightness, coughing, and wheezing. Monitor frequency of use.  Breathing control goals:  Full participation in all desired activities (may need albuterol before activity) Albuterol use two times or less a week on average (not counting use with activity) Cough interfering with sleep two times or less a month Oral steroids no more than once a year No hospitalizations   Reflux Continue dietary lifestyle modifications. Continue the regimen as prescribed by your gastroenterologist.  Keep a diary to track symptoms and food exposures  Chronic rhinitis Continue flunisolide nasal spray 2 sprays in each nostril once a day a day as needed for stuffy nose.  In the right nostril, point the applicator out toward the right ear. 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   High blood pressure and palpitations  Monitor symptoms and follow up with PCP and cardiology   Follow up: Next week for spirometry on propranolol  Thank you so much for letting me partake in your care today.  Don't hesitate to reach out if you have any additional concerns!  Roney Marion, MD   [facility] [place] [place] No follow-ups on file.  No orders of the defined types were placed in this encounter.   Lab Orders  No laboratory test(s) ordered today   Diagnostics: Spirometry:  Tracings reviewed. Her effort: Good reproducible efforts. FVC: 2.33L FEV1: 2.01L, 83% predicted FEV1/FVC ratio: 86% Interpretation: Spirometry consistent with normal pattern.  Please see scanned spirometry results for details.      Medication List:  Current Outpatient Medications  Medication Sig Dispense Refill   blood glucose meter kit and supplies Dispense based on patient and insurance preference. Use to check blood sugars twice daily E11.9 1 each 0   flunisolide (NASALIDE) 25 MCG/ACT (0.025%) SOLN Place 2 sprays into the nose daily as needed (for stuffy nose). 25 mL 5   hydrocortisone (ANUSOL-HC) 2.5 % rectal cream Place 1 Application rectally 2 (two) times daily. 30 g 0   metoprolol tartrate (LOPRESSOR) 50 MG tablet Take 1 tablet (50 mg total) by mouth 2 (two) times daily. 180 tablet 1   potassium chloride (KLOR-CON) 20 MEQ packet Take 20 mEq by mouth as needed. (Patient not taking: Reported on 07/14/2022) 30 each 1   spironolactone (ALDACTONE) 25 MG tablet Take 1 tablet (25 mg total) by mouth as needed. (Patient not taking: Reported on 07/14/2022) 30 tablet 0   No current facility-administered medications for this visit.   Allergies: Allergies  Allergen Reactions   Azithromycin Shortness Of Breath    Lip swelling, SOB.      Ciprofloxacin Swelling    REACTION: tongue swells   Codeine Shortness Of Breath   Erythromycin Base Itching and Rash   Sulfa Antibiotics Shortness Of Breath, Rash and Other (See Comments)   Sulfasalazine Rash and Shortness Of Breath    Other reaction(s): Other (See Comments) Other reaction(s): SHORTNESS OF BREATH   Telmisartan Swelling    Tongue swelling, Micardis   Ace Inhibitors Cough   Aspirin Hives and Other (See Comments)     flushing   Atenolol Other (See Comments)    Squeezing chest sensation   Avelox [Moxifloxacin Hcl In Nacl] Itching        Beta Adrenergic Blockers Other (See Comments)    Feels like chest tightening labetalol, bystolic  Feels like chest tightening "Metoprolol"    Buspar [Buspirone] Other (See Comments)    Light headed   Butorphanol Tartrate Other (See Comments)    Patient aggitated   Cetirizine Hives and Rash        Clonidine Hcl     REACTION: makes blood pressure high   Cortisone     Feels like she is going crazy   Erythromycin Rash   Fentanyl Other (See Comments)    aggressive    Fluoxetine Hcl Other (See Comments)    REACTION: headaches   Ketorolac Tromethamine     jittery   Lidocaine Other (See Comments)    When it involves the throat,    Lisinopril Cough   Metoclopramide Hcl Other (See Comments)    Dystonic reaction   Midazolam Other (See Comments)    agitation Slow to wake up   Montelukast Other (See Comments)    Singulair  Montelukast Sodium Other (See Comments)    DOES NOT REMEMBER  Don't remember-told not to take   Naproxen Other (See Comments)    FLUSHING Pt states she took Ibuprofen today (10/08/19)   Paroxetine Other (See Comments)    REACTION: headaches   Penicillins Rash   Pravastatin Other (See Comments)    Myalgias   Promethazine Other (See Comments)    Dystonic reaction   Promethazine Hcl Other (See Comments)    jittery   Quinolones Swelling and Rash   Serotonin Reuptake Inhibitors (Ssris) Other (See Comments)    Headache Effexor, prozac, zoloft,    Sertraline Hcl     REACTION: headaches   Stelazine [Trifluoperazine] Other (See Comments)    Dystonic reaction   Tobramycin Itching and Rash   Trifluoperazine Hcl     dystonic   Atrovent Nasal Spray [Ipratropium]     Tachycardia and shaking   Diltiazem Other (See Comments)    Chest pain   Iodinated Contrast Media     Other reaction(s): Other (See Comments) Chest heaviness/sob    Polyethylene Glycol 3350     Other reaction(s): Laryngeal Edema (ALLERGY)   Propoxyphene    Adhesive [Tape] Rash    EKG monitor patches, some tapes Blisters, rash, itching, welts.   Butorphanol Anxiety    Patient agitated   Ceftriaxone Rash    rocephin   Iron Rash    Flushing with certain IV types   Metoclopramide Itching and Other (See Comments)    Dystonic reaction   Metronidazole Rash   Other Rash and Other (See Comments)    Uncoded Allergy. Allergen: steriods, Other Reaction: Not Assessed Other reaction(s): Flushing (ALLERGY/intolerance), GI Upset (intolerance), Hypertension (intolerance), Increased Heart Rate (intolerance), Mental Status Changes (intolerance), Other (See Comments), Tachycardia / Palpitations  (intolerance) Hospital gowns leave a rash.    Prednisone Anxiety and Palpitations   Prochlorperazine Anxiety    Compazine:  Dystonic reaction   Venlafaxine Anxiety   Zyrtec [Cetirizine Hcl] Rash    All over body   I reviewed her past medical history, social history, family history, and environmental history and no significant changes have been reported from her previous visit.  ROS: All others negative except as noted per HPI.   Objective: BP (!) 142/78   Pulse 98   Temp 98.1 F (36.7 C) (Temporal)   Resp 18   LMP 06/25/2013   SpO2 100%  There is no height or weight on file to calculate BMI. General Appearance:  Alert, cooperative, tearful, appears stated age  Head:  Normocephalic, without obvious abnormality, atraumatic  Eyes:  Conjunctiva clear, EOM's intact  Nose: Nares normal,   Throat: Lips, tongue normal; teeth and gums normal,   Neck: Supple, symmetrical  Lungs:   clear to auscultation bilaterally, Respirations unlabored, no coughing  Heart:  regular rate and rhythm and no murmur, Appears well perfused  Extremities: No edema  Skin: Skin color, texture, turgor normal, no rashes or lesions on visualized portions of skin   Neurologic: No gross deficits    Previous notes and tests were reviewed. The plan was reviewed with the patient/family, and all questions/concerned were addressed.  It was my pleasure to see Jennifer [name] today and participate in her care. Please feel free to contact me with any questions or concerns.  Sincerely,  Roney Marion, MD  Allergy & Immunology  [facility] [facility] Office: [phone]

## 2022-07-26 NOTE — Patient Instructions (Addendum)
Coughing: We will have you restart your propranolol as I think your dyspnea is due to your palpitations.  Return to clinic next week for spirometry while on propranolol Daily controller medication(s): does not tolerate any inhaled steroids  May use levoalbuterol rescue inhaler 2 puffs or nebulizer every 4 to 6 hours as needed for shortness of breath, chest tightness, coughing, and wheezing. Monitor frequency of use.  Breathing control goals:  Full participation in all desired activities (may need albuterol before activity) Albuterol use two times or less a week on average (not counting use with activity) Cough interfering with sleep two times or less a month Oral steroids no more than once a year No hospitalizations   Reflux Continue dietary lifestyle modifications. Continue the regimen as prescribed by your gastroenterologist.  Keep a diary to track symptoms and food exposures  Chronic rhinitis Continue flunisolide nasal spray 2 sprays in each nostril once a day a day as needed for stuffy nose.  In the right nostril, point the applicator out toward the right ear. 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   High blood pressure and palpitations  Monitor symptoms and follow up with PCP and cardiology   Follow up: Next week for spirometry on propranolol  Thank you so much for  letting me partake in your care today.  Don't hesitate to reach out if you have any additional concerns!  Roney Marion, MD  [facility] [place] [place]

## 2022-07-27 ENCOUNTER — Inpatient Hospital Stay: Payer: Medicare HMO | Admitting: Licensed Clinical Social Worker

## 2022-07-27 DIAGNOSIS — R079 Chest pain, unspecified: Secondary | ICD-10-CM | POA: Diagnosis not present

## 2022-07-28 ENCOUNTER — Telehealth (INDEPENDENT_AMBULATORY_CARE_PROVIDER_SITE_OTHER): Payer: Medicare HMO | Admitting: Family Medicine

## 2022-07-28 ENCOUNTER — Encounter: Payer: Self-pay | Admitting: Family Medicine

## 2022-07-28 VITALS — BP 142/80 | HR 100 | Wt 236.0 lb

## 2022-07-28 DIAGNOSIS — J9811 Atelectasis: Secondary | ICD-10-CM | POA: Diagnosis not present

## 2022-07-28 DIAGNOSIS — R2232 Localized swelling, mass and lump, left upper limb: Secondary | ICD-10-CM | POA: Diagnosis not present

## 2022-07-28 DIAGNOSIS — R002 Palpitations: Secondary | ICD-10-CM | POA: Diagnosis not present

## 2022-07-28 DIAGNOSIS — K5900 Constipation, unspecified: Secondary | ICD-10-CM | POA: Diagnosis not present

## 2022-07-28 DIAGNOSIS — I4719 Other supraventricular tachycardia: Secondary | ICD-10-CM

## 2022-07-28 DIAGNOSIS — E1165 Type 2 diabetes mellitus with hyperglycemia: Secondary | ICD-10-CM

## 2022-07-28 DIAGNOSIS — I491 Atrial premature depolarization: Secondary | ICD-10-CM | POA: Diagnosis not present

## 2022-07-28 DIAGNOSIS — G4733 Obstructive sleep apnea (adult) (pediatric): Secondary | ICD-10-CM | POA: Diagnosis not present

## 2022-07-28 DIAGNOSIS — I2510 Atherosclerotic heart disease of native coronary artery without angina pectoris: Secondary | ICD-10-CM | POA: Diagnosis not present

## 2022-07-28 DIAGNOSIS — I10 Essential (primary) hypertension: Secondary | ICD-10-CM | POA: Diagnosis not present

## 2022-07-28 NOTE — Progress Notes (Signed)
Virtual Visit via Video Note  I connected with NIKYLA NAVEDO on 07/28/22 at  2:00 PM EST by a video enabled telemedicine application and verified that I am speaking with the correct person using two identifiers.   I discussed the limitations of evaluation and management by telemedicine and the availability of in person appointments. The patient expressed understanding and agreed to proceed.  Patient location: at home Provider location: in office  Subjective:    CC:   Chief Complaint  Patient presents with   Follow-up    HPI: Feels like something in her right lungs. Feels like can't take a deep breath.  Had a CXR at [facility] on 07/26/22 that showed atelectasis.   She says she is going to retry the propranolol.  Stopped it initially bc felt more SOB on the medication.    Mammogram done 12/13.   Was having palpitations and went to the ED on 12/13.  Saw Dr. Theda Belfast at [facility].     Didn't start antibiotic because the blood discharge got better.  Still having some congestion.   Went for [employer] but they didn't do the [place] of axilla. She would like to get that scheduled.   Past medical history, Surgical history, Family history not pertinant except as noted below, Social history, Allergies, and medications have been entered into the medical record, reviewed, and corrections made.    Objective:    General: Speaking clearly in complete sentences without any shortness of breath.  Alert and oriented x3.  Normal judgment. No apparent acute distress.  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 Noted to have athritis on xray. No fracture.    Impression and Recommendations:    Problem List Items Addressed This Visit       Cardiovascular and Mediastinum   PAT (paroxysmal atrial tachycardia)    Working with Cardiology trying to find a medication that will work well for her. Planning on restarting the propranolol.         Endocrine   Uncontrolled  type 2 diabetes mellitus with hyperglycemia ([employer]) - Primary    Did go to the nutrition consult.  Goal for glucose under 180 2 hours post meal.  Hasn't started any medication.       Other Visit Diagnoses     Constipation, unspecified constipation type       Left axillary fullness       Relevant Orders   US BREAST COMPLETE UNI LEFT INC AXILLA   Atelectasis          Constipation - encouraged her to take her regimen for this. This was noticed on recent CT scan.    Will place new order for axillar fullness and pain.  [facility]   Reviewed CXR results.   Orders Placed This Encounter  Procedures   US BREAST COMPLETE UNI LEFT INC AXILLA    Standing Status:   Future    Standing Expiration Date:   07/29/2023    Scheduling Instructions:     [facility].    Order Specific Question:   Reason for Exam (SYMPTOM  OR DIAGNOSIS REQUIRED)    Answer:   fullness and pain in the left axilla    Order Specific Question:   Preferred imaging location?    Answer:   External    No orders of the defined types were placed in this encounter.   I discussed the assessment and treatment plan with  the patient. The patient was provided an opportunity to ask questions and all were answered. The patient agreed with the plan and demonstrated an understanding of the instructions.   The patient was advised to call back or seek an in-person evaluation if the symptoms worsen or if the condition fails to improve as anticipated.  I spent 40 minutes on the day of the encounter to include pre-visit record review, face-to-face time with the patient and post visit ordering of test.   Beatrice Lecher, MD

## 2022-07-28 NOTE — Assessment & Plan Note (Signed)
Working with Cardiology trying to find a medication that will work well for her. Planning on restarting the propranolol.

## 2022-07-28 NOTE — Assessment & Plan Note (Signed)
Did go to the nutrition consult.  Goal for glucose under 180 2 hours post meal.  Hasn't started any medication.

## 2022-07-28 NOTE — Progress Notes (Signed)
Pt stated that she was seen by cards today. She hasn't taken any medication today.   She said that when she takes the propanolol it causes SOB so she goes to the pulmonologist on Tuesday to see how this affects her lungs.

## 2022-07-29 DIAGNOSIS — R002 Palpitations: Secondary | ICD-10-CM | POA: Diagnosis not present

## 2022-07-29 DIAGNOSIS — R0602 Shortness of breath: Secondary | ICD-10-CM | POA: Diagnosis not present

## 2022-07-29 DIAGNOSIS — S2231XA Fracture of one rib, right side, initial encounter for closed fracture: Secondary | ICD-10-CM | POA: Diagnosis not present

## 2022-07-29 DIAGNOSIS — Z20822 Contact with and (suspected) exposure to covid-19: Secondary | ICD-10-CM | POA: Diagnosis not present

## 2022-07-29 DIAGNOSIS — R079 Chest pain, unspecified: Secondary | ICD-10-CM | POA: Diagnosis not present

## 2022-07-29 DIAGNOSIS — R918 Other nonspecific abnormal finding of lung field: Secondary | ICD-10-CM | POA: Diagnosis not present

## 2022-07-29 DIAGNOSIS — R051 Acute cough: Secondary | ICD-10-CM | POA: Diagnosis not present

## 2022-08-01 ENCOUNTER — Ambulatory Visit: Payer: Medicare HMO | Admitting: Internal Medicine

## 2022-08-01 DIAGNOSIS — R0789 Other chest pain: Secondary | ICD-10-CM | POA: Diagnosis not present

## 2022-08-01 DIAGNOSIS — R109 Unspecified abdominal pain: Secondary | ICD-10-CM | POA: Diagnosis not present

## 2022-08-01 DIAGNOSIS — I10 Essential (primary) hypertension: Secondary | ICD-10-CM | POA: Diagnosis not present

## 2022-08-01 DIAGNOSIS — I491 Atrial premature depolarization: Secondary | ICD-10-CM | POA: Diagnosis not present

## 2022-08-01 DIAGNOSIS — R000 Tachycardia, unspecified: Secondary | ICD-10-CM | POA: Diagnosis not present

## 2022-08-01 DIAGNOSIS — Z79899 Other long term (current) drug therapy: Secondary | ICD-10-CM | POA: Diagnosis not present

## 2022-08-01 DIAGNOSIS — M545 Low back pain, unspecified: Secondary | ICD-10-CM | POA: Diagnosis not present

## 2022-08-03 IMAGING — DX DG ABDOMEN ACUTE W/ 1V CHEST
4 series · 4 of 4 positions shown · non-contrast
Comparison: 12/31/2020

CLINICAL DATA: Abdominal discomfort

EXAM:
DG ABDOMEN ACUTE WITH 1 VIEW CHEST

[chest pa]
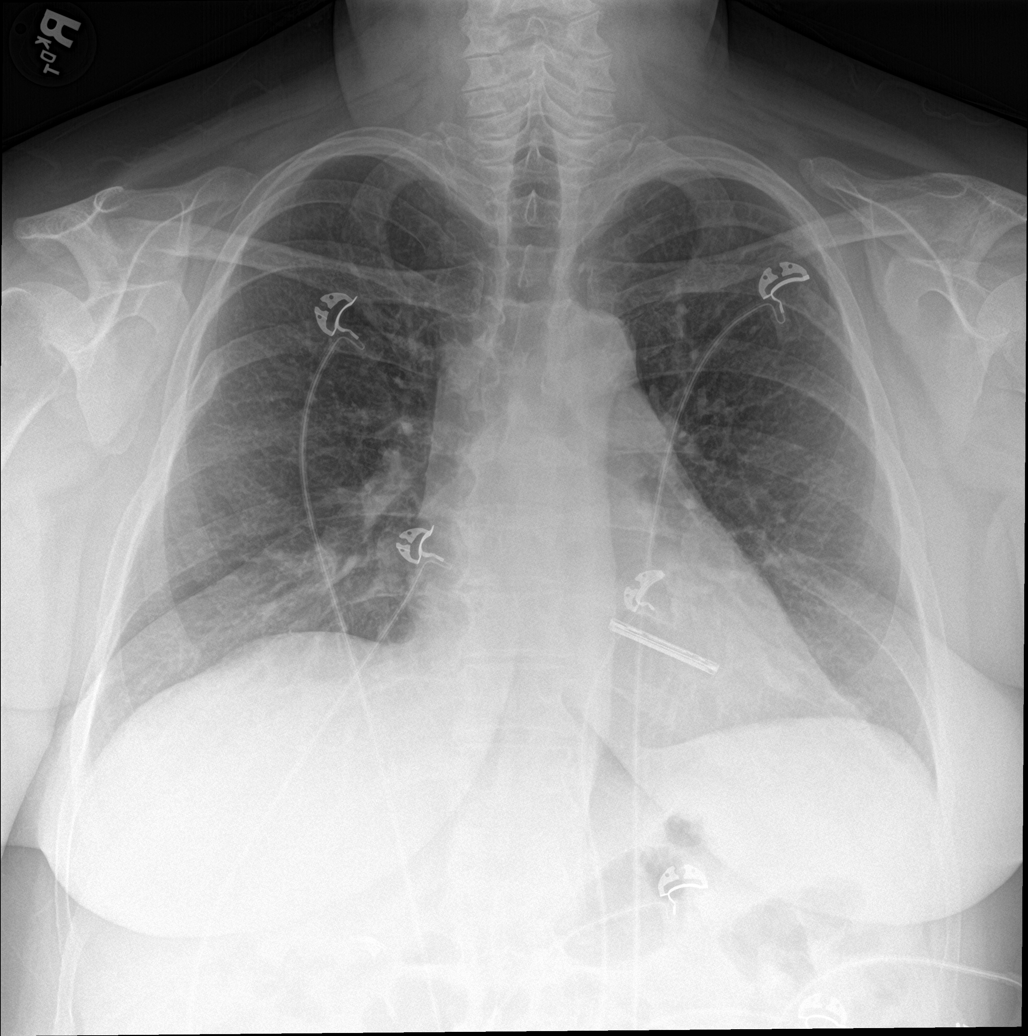

[abdomen erect]
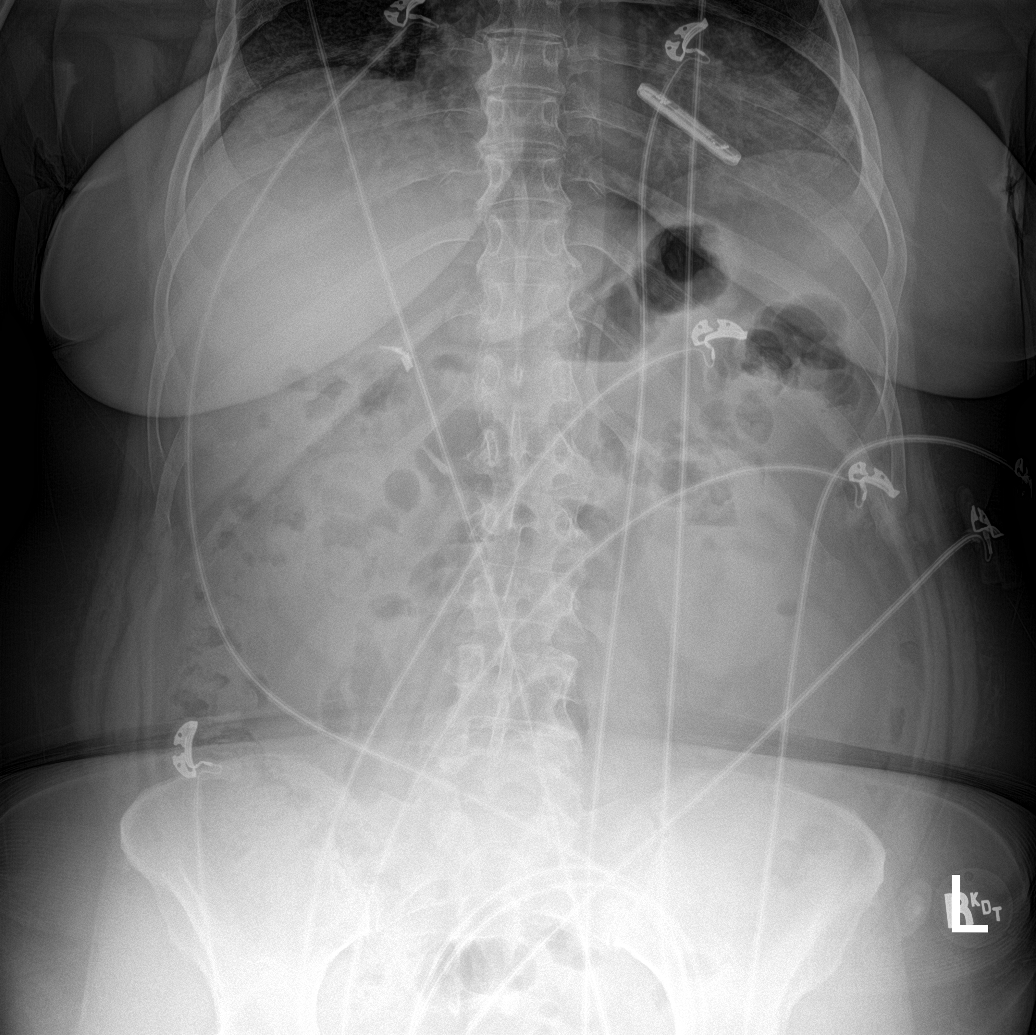

[abdomen supine (1 of 2)]
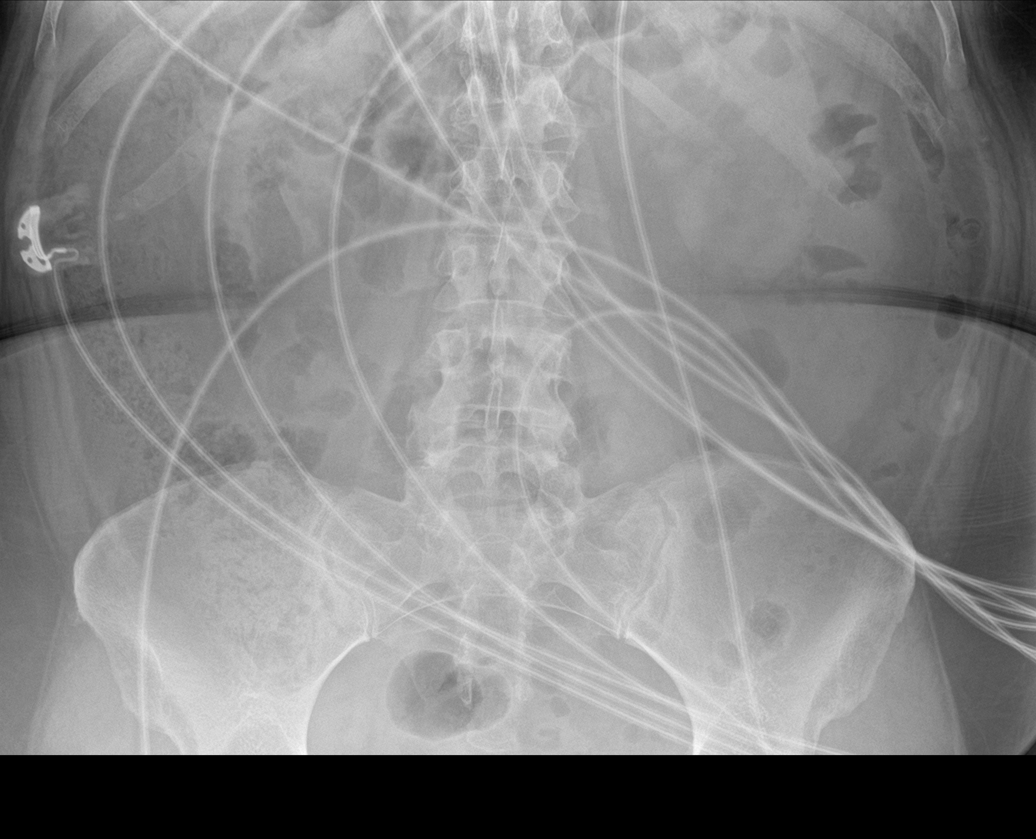

[abdomen supine (2 of 2)]
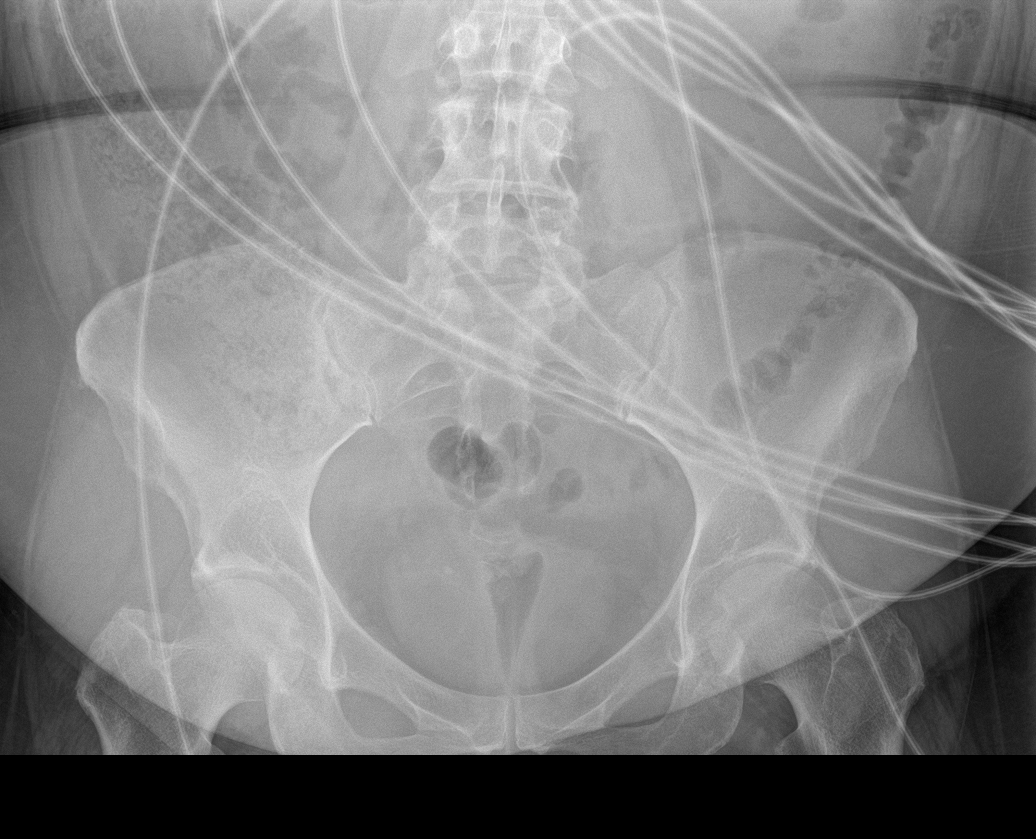

[4 of 4 positions shown; findings below may reference images not displayed]

FINDINGS: Normal heart size and mediastinal contours. Implant over the left
chest wall. There is no edema, consolidation, effusion, or
pneumothorax. Remote right rib fractures.

Normal bowel gas pattern. No concerning mass effect or gas
collection. Cholecystectomy clips. Chronic calcification over the
right hemipelvis, presumed phlebolith.

Artifact from EKG pads.
IMPRESSION: No acute finding in the chest or abdomen

## 2022-08-04 DIAGNOSIS — R457 State of emotional shock and stress, unspecified: Secondary | ICD-10-CM | POA: Diagnosis not present

## 2022-08-04 DIAGNOSIS — R002 Palpitations: Secondary | ICD-10-CM | POA: Diagnosis not present

## 2022-08-04 DIAGNOSIS — R000 Tachycardia, unspecified: Secondary | ICD-10-CM | POA: Diagnosis not present

## 2022-08-04 DIAGNOSIS — R072 Precordial pain: Secondary | ICD-10-CM | POA: Diagnosis not present

## 2022-08-04 DIAGNOSIS — R0602 Shortness of breath: Secondary | ICD-10-CM | POA: Diagnosis not present

## 2022-08-04 DIAGNOSIS — S2231XA Fracture of one rib, right side, initial encounter for closed fracture: Secondary | ICD-10-CM | POA: Diagnosis not present

## 2022-08-04 DIAGNOSIS — R55 Syncope and collapse: Secondary | ICD-10-CM | POA: Diagnosis not present

## 2022-08-04 DIAGNOSIS — Z87891 Personal history of nicotine dependence: Secondary | ICD-10-CM | POA: Diagnosis not present

## 2022-08-04 DIAGNOSIS — I10 Essential (primary) hypertension: Secondary | ICD-10-CM | POA: Diagnosis not present

## 2022-08-04 DIAGNOSIS — R079 Chest pain, unspecified: Secondary | ICD-10-CM | POA: Diagnosis not present

## 2022-08-05 DIAGNOSIS — R000 Tachycardia, unspecified: Secondary | ICD-10-CM | POA: Diagnosis not present

## 2022-08-05 DIAGNOSIS — R9431 Abnormal electrocardiogram [ECG] [EKG]: Secondary | ICD-10-CM | POA: Diagnosis not present

## 2022-08-08 DIAGNOSIS — R1010 Upper abdominal pain, unspecified: Secondary | ICD-10-CM | POA: Diagnosis not present

## 2022-08-08 DIAGNOSIS — K5730 Diverticulosis of large intestine without perforation or abscess without bleeding: Secondary | ICD-10-CM | POA: Diagnosis not present

## 2022-08-08 DIAGNOSIS — R935 Abnormal findings on diagnostic imaging of other abdominal regions, including retroperitoneum: Secondary | ICD-10-CM | POA: Diagnosis not present

## 2022-08-08 DIAGNOSIS — I700 Atherosclerosis of aorta: Secondary | ICD-10-CM | POA: Diagnosis not present

## 2022-08-08 DIAGNOSIS — R109 Unspecified abdominal pain: Secondary | ICD-10-CM | POA: Diagnosis not present

## 2022-08-08 DIAGNOSIS — R140 Abdominal distension (gaseous): Secondary | ICD-10-CM | POA: Diagnosis not present

## 2022-08-09 ENCOUNTER — Telehealth: Payer: Self-pay | Admitting: Licensed Clinical Social Worker

## 2022-08-09 DIAGNOSIS — R109 Unspecified abdominal pain: Secondary | ICD-10-CM | POA: Diagnosis not present

## 2022-08-09 DIAGNOSIS — I700 Atherosclerosis of aorta: Secondary | ICD-10-CM | POA: Diagnosis not present

## 2022-08-09 NOTE — Telephone Encounter (Signed)
[place] [employer] representative received return TC from pt re: ongoing therapy. Pt agreed to referral to [facility] who should be able to provide ongoing counseling for pt. Referral made.  While on the phone, pt stated that she needs to reschedule her surgery with Dr. Ernestina Patches, and is asking about dates in April. Message sent to medical team re: pt request.     Christeen Douglas, [place] Worker [facility]

## 2022-08-11 ENCOUNTER — Encounter: Payer: Self-pay | Admitting: Family Medicine

## 2022-08-11 ENCOUNTER — Telehealth: Payer: Self-pay | Admitting: Surgery

## 2022-08-11 ENCOUNTER — Ambulatory Visit (INDEPENDENT_AMBULATORY_CARE_PROVIDER_SITE_OTHER): Payer: Medicare HMO | Admitting: Family Medicine

## 2022-08-11 VITALS — BP 155/75 | HR 81 | Ht 62.0 in | Wt 238.0 lb

## 2022-08-11 DIAGNOSIS — K9049 Malabsorption due to intolerance, not elsewhere classified: Secondary | ICD-10-CM

## 2022-08-11 DIAGNOSIS — R1011 Right upper quadrant pain: Secondary | ICD-10-CM | POA: Diagnosis not present

## 2022-08-11 DIAGNOSIS — F339 Major depressive disorder, recurrent, unspecified: Secondary | ICD-10-CM | POA: Diagnosis not present

## 2022-08-11 DIAGNOSIS — I10 Essential (primary) hypertension: Secondary | ICD-10-CM | POA: Diagnosis not present

## 2022-08-11 DIAGNOSIS — M542 Cervicalgia: Secondary | ICD-10-CM

## 2022-08-11 NOTE — Assessment & Plan Note (Signed)
Pressure is uncontrolled.  She is following with cardiology she says it will take 6 months for her to get in with the EP doctor for the SVTs that she experiences.

## 2022-08-11 NOTE — Assessment & Plan Note (Signed)
Urged her to get back on track by avoiding foods that she has known sensitivities to.  Previously she had tested positive for mushroom, Caisrn and egg white.

## 2022-08-11 NOTE — Progress Notes (Signed)
Established Patient Office Visit  Subjective   Patient ID: Jennifer [name], female    DOB: Dec 22, 1965  Age: 56 y.o. MRN: [id number]  Chief Complaint  Patient presents with   Follow-up         HPI   Starting to experience some right upper quadrant pain again.  She has had issues on and off over several years but has been quite sometime since she has had that discomfort.  It is right below the rib cage over the abdomen.  It comes and goes.  She does have history of chronic constipation but says she is actually been using her softener as well as her Dulcolax laxative as needed and just had a CT of the abdomen recently that just showed some mild stool in the right and transverse colon.  No other abnormal or worrisome findings.  No vomiting.  Follow-up mood-PHQ-9 score of 9 today and GAD-7 score of 11.  Not currently on any medication and not currently engaged in any therapy or counseling.  She has been trying to really get back into doing a lot of crafting which she finds enjoyable most days.  And really finds it very positive.  She wants to know what would be safe to take for joint pain and arthritis.  Her neck has been bothering her more recently as well as her low back.  She is going to see the orthopedist in January.  She is not actively in physical therapy.      ROS    Objective:     BP (!) 155/75   Pulse 81   Ht '5\' 2"'$  (1.575 m)   Wt 238 lb (108 kg)   LMP 06/25/2013   SpO2 95%   BMI 43.53 kg/m    Physical Exam   No results found for any visits on 08/11/22.    The ASCVD Risk score (Arnett DK, et al., 2019) failed to calculate for the following reasons:   The patient has a prior MI or stroke diagnosis    Assessment & Plan:   Problem List Items Addressed This Visit       Cardiovascular and Mediastinum   Hypertension with intolerance to multiple antihypertensive drugs - Primary    Pressure is uncontrolled.  She is following with cardiology she says it will  take 6 months for her to get in with the EP doctor for the SVTs that she experiences.        Other   Food intolerance    Urged her to get back on track by avoiding foods that she has known sensitivities to.  Previously she had tested positive for mushroom, Caisrn and egg white.      Depression, recurrent ([place])    I am excited to hear that she is getting back into some crafting we discussed really focusing on positive things funny means and videos as well as taking some time daily to reflect on the positive things in her life.  I still would really like for her to engage with a therapist/counselor.      Cervical pain    Discussed doing some stretches and exercises for her neck.  She is going to see the orthopedist in January.  I was getting give her a copy of the stretches before she left today we can put those in the mail.      Other Visit Diagnoses     Right upper quadrant pain  Right upper quadrant pain-she is tender on exam little bit to the right but towards the epigastric area not over the ribs.  Recent CT was negative for concerning findings.  We discussed that it could just be gas it could also be a little bit of narrowing she has had prior abdominal surgeries.  Just encouraged her to continue to work on keeping the stool soft using her stool softener with each meal and using her laxative as needed staying hydrated and avoiding foods that she has known sensitivities to.  No follow-ups on file.   I spent 40 minutes on the day of the encounter to include pre-visit record review, face-to-face time with the patient and post visit ordering of test.   Beatrice Lecher, MD

## 2022-08-11 NOTE — Telephone Encounter (Signed)
Reached out to patient regarding rescheduling surgery. Patient advised that Dr Ernestina Patches does not recommend waiting until April to reschedule her surgery due to cancer risk. Patient verbalized understanding but maintained that she has a lot going on at home and needs to deal with some things before she has surgery again. She also stated she still has unresolved emotional issues from her hysterectomy, partially due to not having any support afterwards and partially due to feeling angry she has gained so much weight since. Patient agreed to surgery date of April 2nd and stated that if anything changed she would call back to see if there was a sooner date for surgery.

## 2022-08-11 NOTE — Assessment & Plan Note (Signed)
Discussed doing some stretches and exercises for her neck.  She is going to see the orthopedist in January.  I was getting give her a copy of the stretches before she left today we can put those in the mail.

## 2022-08-11 NOTE — Assessment & Plan Note (Signed)
I am excited to hear that she is getting back into some crafting we discussed really focusing on positive things funny means and videos as well as taking some time daily to reflect on the positive things in her life.  I still would really like for her to engage with a therapist/counselor.

## 2022-08-14 DIAGNOSIS — Z87891 Personal history of nicotine dependence: Secondary | ICD-10-CM | POA: Diagnosis not present

## 2022-08-14 DIAGNOSIS — R0789 Other chest pain: Secondary | ICD-10-CM | POA: Diagnosis not present

## 2022-08-14 DIAGNOSIS — Z20822 Contact with and (suspected) exposure to covid-19: Secondary | ICD-10-CM | POA: Diagnosis not present

## 2022-08-14 DIAGNOSIS — J029 Acute pharyngitis, unspecified: Secondary | ICD-10-CM | POA: Diagnosis not present

## 2022-08-14 DIAGNOSIS — R9431 Abnormal electrocardiogram [ECG] [EKG]: Secondary | ICD-10-CM | POA: Diagnosis not present

## 2022-08-14 DIAGNOSIS — I10 Essential (primary) hypertension: Secondary | ICD-10-CM | POA: Diagnosis not present

## 2022-08-15 ENCOUNTER — Telehealth: Payer: Self-pay | Admitting: Genetic Counselor

## 2022-08-15 ENCOUNTER — Telehealth: Payer: Self-pay | Admitting: *Deleted

## 2022-08-15 DIAGNOSIS — I491 Atrial premature depolarization: Secondary | ICD-10-CM | POA: Diagnosis not present

## 2022-08-15 NOTE — Telephone Encounter (Signed)
Patient called to schedule surgery, attempted to reach appropriate channels but those that schedule for that are out of the office. Forwarding patient information so she can be called another time.

## 2022-08-15 NOTE — Telephone Encounter (Signed)
Patient checked surgeyr date

## 2022-08-16 ENCOUNTER — Inpatient Hospital Stay: Payer: Medicare HMO | Attending: Obstetrics & Gynecology | Admitting: Licensed Clinical Social Worker

## 2022-08-16 DIAGNOSIS — R002 Palpitations: Secondary | ICD-10-CM | POA: Diagnosis not present

## 2022-08-16 DIAGNOSIS — R000 Tachycardia, unspecified: Secondary | ICD-10-CM | POA: Diagnosis not present

## 2022-08-16 NOTE — Progress Notes (Signed)
[name] CSW Counseling Note  Patient was referred by self. Treatment type: Individual via phone  Presenting Concerns: Patient and/or family reports the following symptoms/concerns: anxiety, depression, and stress Duration of problem: years. Worse last few months with recurrent Paget's diagnosis as well as other medical concerns ; Severity of problem: severe   Orientation:oriented to person, place, time/date, and situation.     Patient and/or Family's Strengths/Protective Factors: Communication skills  Special hobby/interest      Goals Addressed: Patient will:  Reduce symptoms of: anxiety, depression, and stress Increase healthy adjustment to current life circumstances and Increase adequate support systems for patient/family Increase ability to move forward with decisions regarding care   Progress towards Goals: Progressing   Interventions: Interventions utilized:  Solution Focused, Supportive, and Reframing      Assessment: Since last visit, pt continues to struggle with depression. She has rescheduled her Paget's surgery. Spent much of today's visit processing loss in pt's life (friend's death, not seeing granddaughter anymore, loss of physical abilities since Covid, heart issues, loss of independence) which have caused significant feelings of loneliness and loss of hope.        Plan: Follow up with CSW: 2 weeks via phone Behavioral recommendations: Continue to do your crafts and watch funny videos. Continue to think about what quality of life looks like and what might help you feel ready for surgery in April Referral(s): Individual therapist- CSW following up on referral and encouraged pt to reach out to [facility]     Jennifer [name] E Leanora Murin, LCSW

## 2022-08-17 DIAGNOSIS — R000 Tachycardia, unspecified: Secondary | ICD-10-CM | POA: Diagnosis not present

## 2022-08-18 DIAGNOSIS — R9431 Abnormal electrocardiogram [ECG] [EKG]: Secondary | ICD-10-CM | POA: Diagnosis not present

## 2022-08-18 DIAGNOSIS — R002 Palpitations: Secondary | ICD-10-CM | POA: Diagnosis not present

## 2022-08-20 DIAGNOSIS — R0602 Shortness of breath: Secondary | ICD-10-CM | POA: Diagnosis not present

## 2022-08-22 DIAGNOSIS — R002 Palpitations: Secondary | ICD-10-CM | POA: Diagnosis not present

## 2022-08-22 DIAGNOSIS — Z20822 Contact with and (suspected) exposure to covid-19: Secondary | ICD-10-CM | POA: Diagnosis not present

## 2022-08-22 DIAGNOSIS — A084 Viral intestinal infection, unspecified: Secondary | ICD-10-CM | POA: Diagnosis not present

## 2022-08-22 DIAGNOSIS — R739 Hyperglycemia, unspecified: Secondary | ICD-10-CM | POA: Diagnosis not present

## 2022-08-22 DIAGNOSIS — R42 Dizziness and giddiness: Secondary | ICD-10-CM | POA: Diagnosis not present

## 2022-08-23 ENCOUNTER — Encounter: Payer: Self-pay | Admitting: Family Medicine

## 2022-08-23 ENCOUNTER — Telehealth (INDEPENDENT_AMBULATORY_CARE_PROVIDER_SITE_OTHER): Payer: Medicare HMO | Admitting: Family Medicine

## 2022-08-23 DIAGNOSIS — C519 Malignant neoplasm of vulva, unspecified: Secondary | ICD-10-CM

## 2022-08-23 DIAGNOSIS — R252 Cramp and spasm: Secondary | ICD-10-CM | POA: Diagnosis not present

## 2022-08-23 DIAGNOSIS — M79602 Pain in left arm: Secondary | ICD-10-CM

## 2022-08-23 DIAGNOSIS — E531 Pyridoxine deficiency: Secondary | ICD-10-CM | POA: Diagnosis not present

## 2022-08-23 DIAGNOSIS — J069 Acute upper respiratory infection, unspecified: Secondary | ICD-10-CM

## 2022-08-23 DIAGNOSIS — R202 Paresthesia of skin: Secondary | ICD-10-CM

## 2022-08-23 DIAGNOSIS — M542 Cervicalgia: Secondary | ICD-10-CM

## 2022-08-23 DIAGNOSIS — E1165 Type 2 diabetes mellitus with hyperglycemia: Secondary | ICD-10-CM

## 2022-08-23 MED ORDER — ACCU-CHEK GUIDE VI STRP: ORAL_STRIP | 11 refills | Status: DC

## 2022-08-23 NOTE — Assessment & Plan Note (Signed)
She has postsurgery out until April.  She really wants to build to go to do some therapy and counseling sessions and feel more prepared before the surgery mentally.

## 2022-08-23 NOTE — Progress Notes (Signed)
Virtual Visit via Video Note  I connected with Jennifer [name] on 08/23/22 at  9:30 AM EST by a video enabled telemedicine application and verified that I am speaking with the correct person using two identifiers.   I discussed the limitations of evaluation and management by telemedicine and the availability of in person appointments. The patient expressed understanding and agreed to proceed.  Patient location: at home Provider location: in office  Subjective:    CC:   Chief Complaint  Patient presents with   Hypertension    HPI: She did go to the emergency department yesterday for viral enteritis.  Did do some labs.  Labs were normal.  Negative troponin.  Negative white blood cell count.  Normal lipase.  She says she just feels weak, dizzy she had a very sore throat.  Some postnasal drip.  No cough or palpitations.  She did have a low-grade temp at 99.1 yesterday in the ED.  Also been having some frontal headaches and bodyaches.  She did take Tylenol today.  Is also seen in the emergency department on January 3 and January 5 for palpitations.  Also seen January 2 for sore throat.  Needs a refill on her metoprolol.  She did decide to move out her surgery for Paget's to have some extra tissue removed from the vulvar area.  She was originally scheduled for January 16 and she is now moved out to April.  She did start therapy/counseling last Monday.  They did some evaluation just to rule out some under Possible diagnoses such as ADD and bipolar.  They do feel like she has major depressive disorder and some PTSD.  She has a follow-up appointment on January 17.   She is also been getting some cramping particularly in her feet and occasionally in her calves particularly with stretching and walking.  She does go barefoot a lot.  She is asked the ED to check her magnesium a couple of times and it has been normal.  Also like to have her B vitamins checked.  She is also been getting  pinprick sensations in her left arm towards the axilla and then occasionally will shoot down the arm.  She has been having more neck pain lately.  She does have an appointment coming up with [facility] but would need a new referral for a new condition for them to address it.  F/U Fibromyalgia - has been feeling more achey in hips and knees and knee caps.  Started ven before she started feeling sick   Past medical history, Surgical history, Family history not pertinant except as noted below, Social history, Allergies, and medications have been entered into the medical record, reviewed, and corrections made.    Objective:    General: Speaking clearly in complete sentences without any shortness of breath.  Alert and oriented x3.  Normal judgment. No apparent acute distress.    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  She really wants to build to go to do some therapy and counseling sessions and feel more prepared before the surgery mentally.        Other   Vitamin  B6 deficiency   Relevant Orders   Vitamin B6   Other Visit Diagnoses     Neck pain       Relevant Orders   Ambulatory referral to Orthopedic Surgery   Left arm pain       Relevant Orders   Ambulatory referral to Orthopedic Surgery   Paresthesia       Relevant Orders   Vitamin B1   Vitamin B6   I96   BASIC METABOLIC PANEL WITH GFR   Foot cramps       Upper respiratory tract infection, unspecified type           Orders Placed This Encounter  Procedures   Vitamin B1   Vitamin B6   V89   BASIC METABOLIC PANEL WITH GFR    Standing Status:   Standing    Number of Occurrences:   12    Standing Expiration Date:    08/24/2023    Order Specific Question:   Remote health to draw?    Answer:   No   Ambulatory referral to Orthopedic Surgery    Referral Priority:   Routine    Referral Type:   Surgical    Referral Reason:   Specialty Services Required    Requested Specialty:   Orthopedic Surgery    Number of Visits Requested:   1    Meds ordered this encounter  Medications   ACCU-CHEK GUIDE test strip    Sig: For testing blood sugars 2 times daily. E11.65    Dispense:  100 each    Refill:  11   Muscle cramping-we did discuss avoiding going barefoot around the house it can absolutely increase risk for triggering cramps.  Also routine exercise and regular stretching can help reduce cramps as well.  I discussed the assessment and treatment plan with the patient. The patient was provided an opportunity to ask questions and all were answered. The patient agreed with the plan and demonstrated an understanding of the instructions.  URI-most likely viral but if not feeling better in a couple days she can consider trial of antibiotics.  She already has a prescription at home that we had written for her for sinusitis previously but her symptoms cleared so she never ended up taking the medication.   The patient was advised to call back or seek an in-person evaluation if the symptoms worsen or if the condition fails to improve as anticipated.  I spent 45 minutes on the day of the encounter to include pre-visit record review, face-to-face time with the patient and post visit ordering of test.   Beatrice Lecher, MD

## 2022-08-23 NOTE — Progress Notes (Signed)
Pt reports that she has a massive sore throat and doesn't feel well. She feels bad all around. She did go to the ED yesterday and was tested for COVID but her test was negative. She had a temp of 99.1 she did leave before being "treated" she said that she now has sinus drainage that is causing her to have stomach upset.   She also stated that she is having arm pain again.    She was c/o cramping and wanted to know if she should get her B panel checked?

## 2022-08-24 ENCOUNTER — Encounter (HOSPITAL_COMMUNITY): Payer: Medicare HMO

## 2022-08-26 ENCOUNTER — Emergency Department (HOSPITAL_BASED_OUTPATIENT_CLINIC_OR_DEPARTMENT_OTHER): Payer: Medicare HMO

## 2022-08-26 ENCOUNTER — Emergency Department (HOSPITAL_BASED_OUTPATIENT_CLINIC_OR_DEPARTMENT_OTHER)
Admission: EM | Admit: 2022-08-26 | Discharge: 2022-08-26 | Disposition: A | Discharge status: Home / Self Care | Payer: Medicare HMO | Attending: Emergency Medicine | Admitting: Emergency Medicine

## 2022-08-26 ENCOUNTER — Encounter (HOSPITAL_BASED_OUTPATIENT_CLINIC_OR_DEPARTMENT_OTHER): Payer: Self-pay | Admitting: Emergency Medicine

## 2022-08-26 ENCOUNTER — Other Ambulatory Visit: Payer: Self-pay

## 2022-08-26 DIAGNOSIS — M542 Cervicalgia: Secondary | ICD-10-CM | POA: Diagnosis not present

## 2022-08-26 DIAGNOSIS — R519 Headache, unspecified: Secondary | ICD-10-CM | POA: Diagnosis not present

## 2022-08-26 DIAGNOSIS — M25552 Pain in left hip: Secondary | ICD-10-CM | POA: Insufficient documentation

## 2022-08-26 DIAGNOSIS — M546 Pain in thoracic spine: Secondary | ICD-10-CM | POA: Diagnosis not present

## 2022-08-26 DIAGNOSIS — S0990XA Unspecified injury of head, initial encounter: Secondary | ICD-10-CM | POA: Diagnosis not present

## 2022-08-26 DIAGNOSIS — R202 Paresthesia of skin: Secondary | ICD-10-CM | POA: Insufficient documentation

## 2022-08-26 DIAGNOSIS — W19XXXA Unspecified fall, initial encounter: Secondary | ICD-10-CM | POA: Insufficient documentation

## 2022-08-26 DIAGNOSIS — R102 Pelvic and perineal pain: Secondary | ICD-10-CM | POA: Diagnosis not present

## 2022-08-26 DIAGNOSIS — M545 Low back pain, unspecified: Secondary | ICD-10-CM | POA: Diagnosis not present

## 2022-08-26 DIAGNOSIS — R42 Dizziness and giddiness: Secondary | ICD-10-CM | POA: Diagnosis not present

## 2022-08-26 IMAGING — US US EXTREM  UP VENOUS*R*
1 series · 13 of 24 positions shown · non-contrast
Comparison: None.

CLINICAL DATA: Acute right arm pain



[Series 1: us extrem up venous*right* · 13 of 28 slices shown]
[im 1/28]
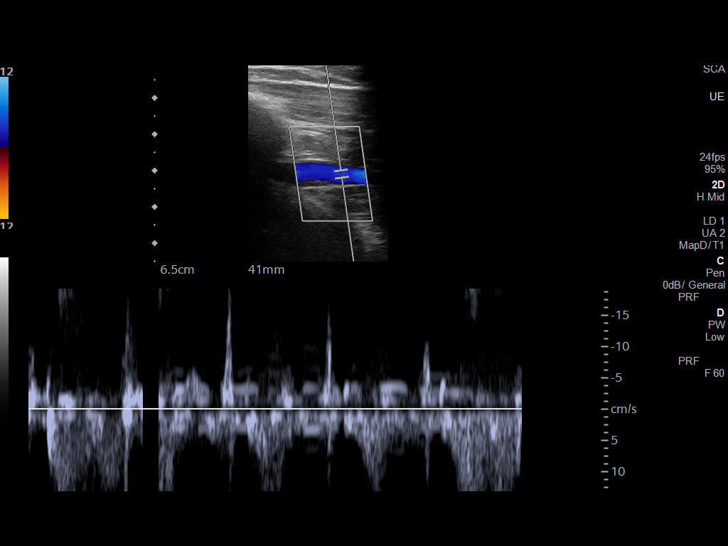
[im 3/28]
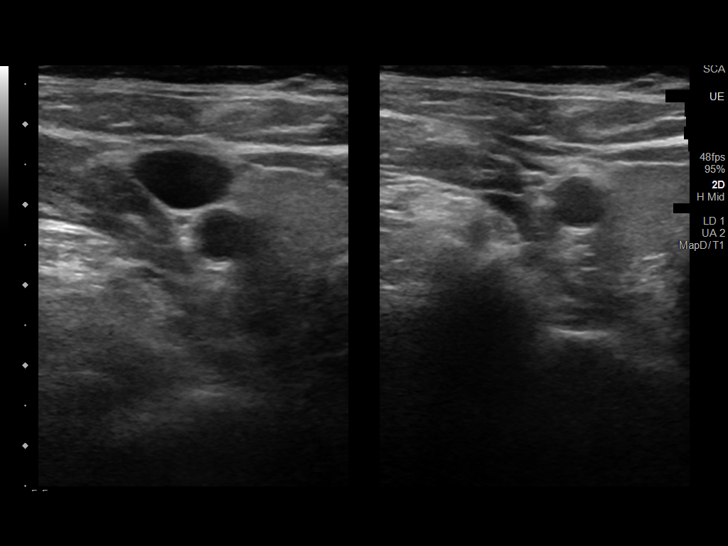
[im 5/28]
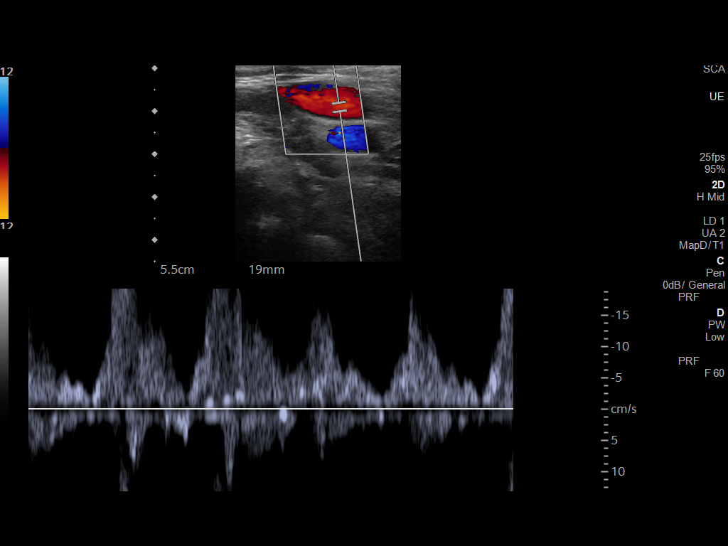
[im 8/28]
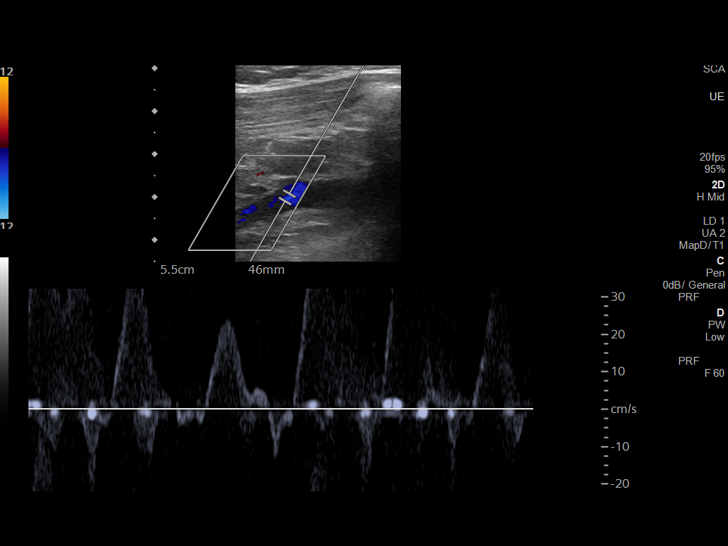
[im 10/28]
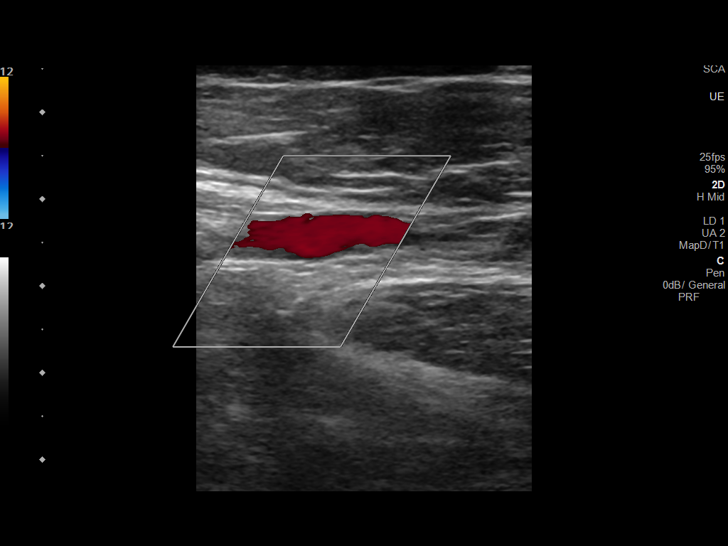
[im 12/28]
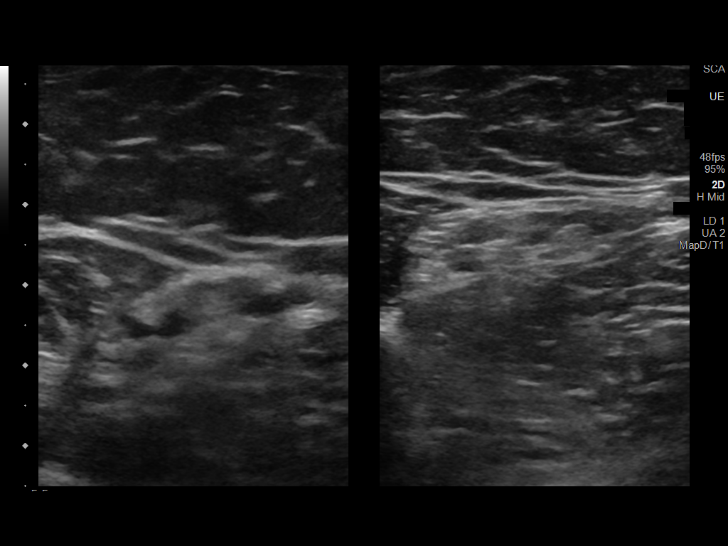
[im 15/28]
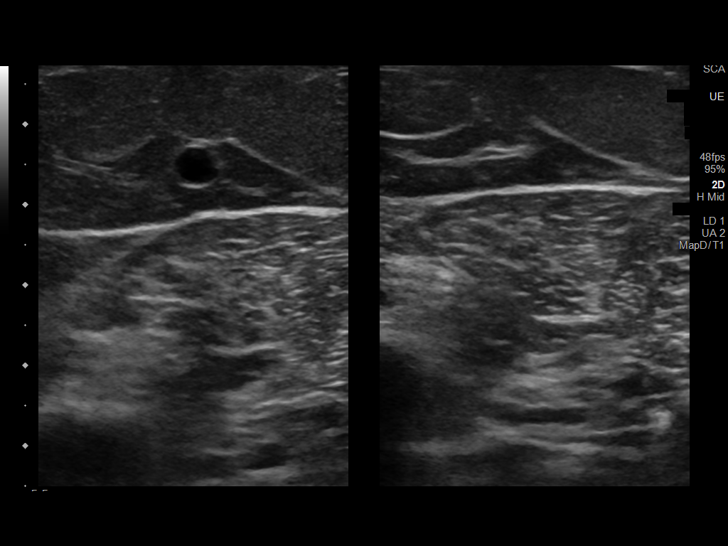
[im 16/28]
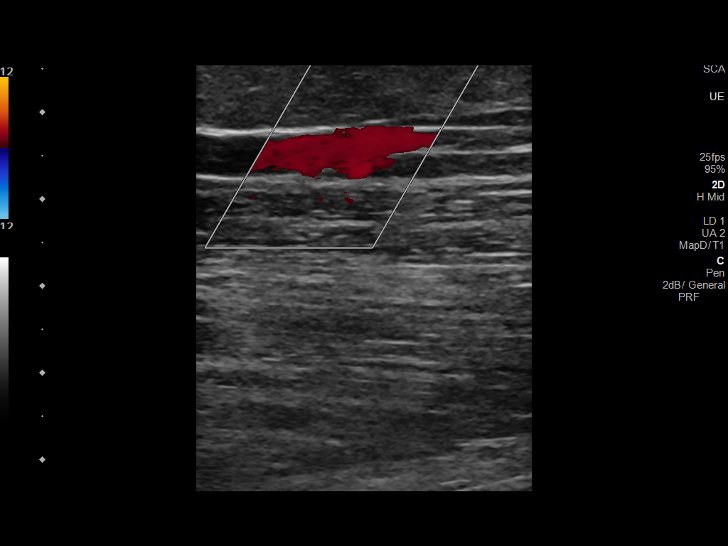
[im 18/28]
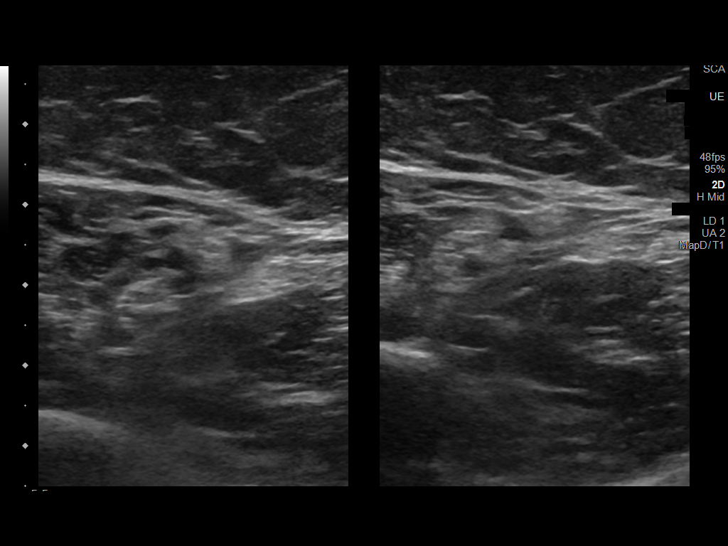
[im 20/28]
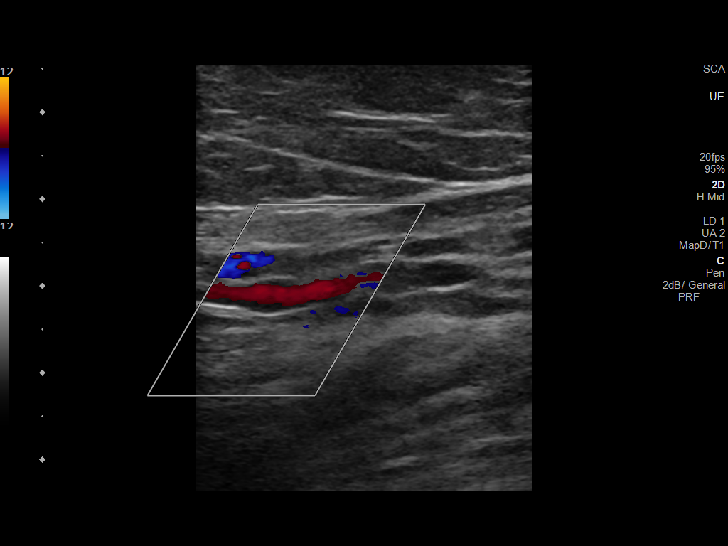
[im 23/28]
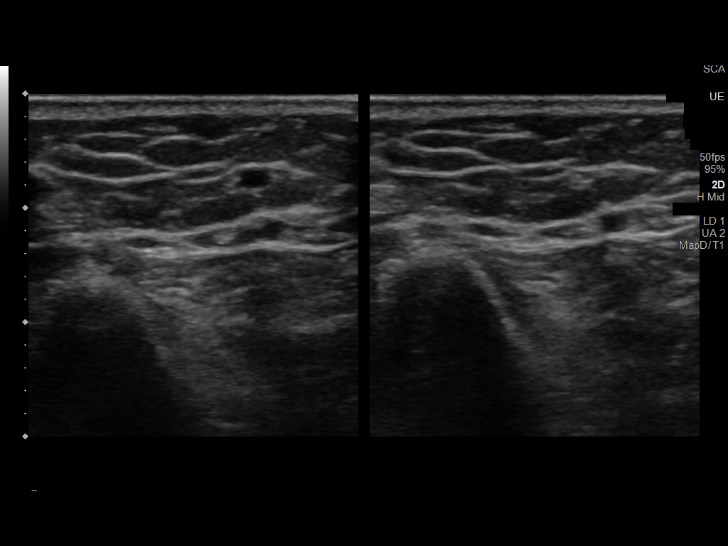
[im 25/28]
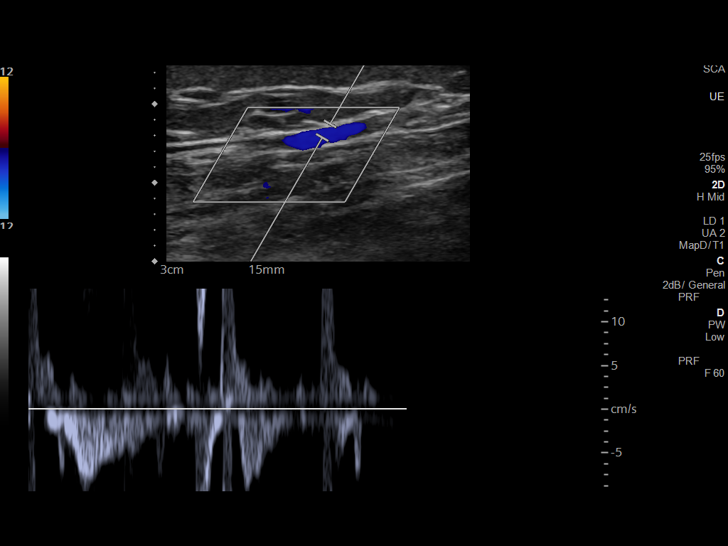
[im 28/28]
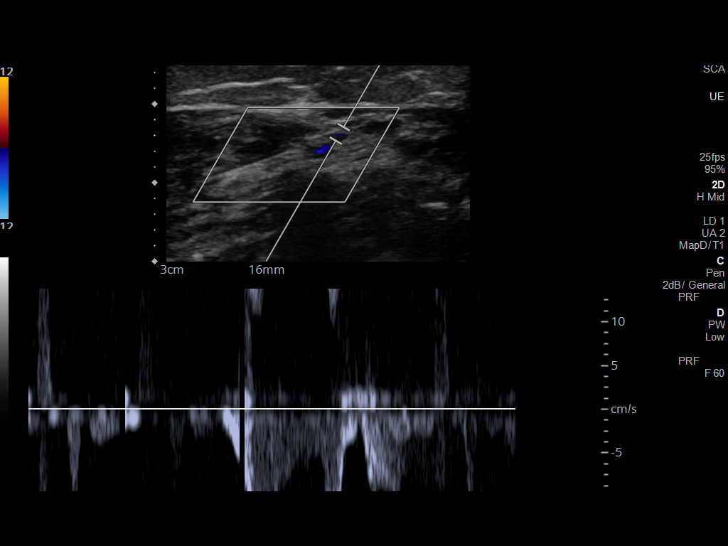

[13 of 24 positions shown; findings below may reference images not displayed]

FINDINGS: Contralateral Subclavian Vein: Respiratory phasicity is normal and
symmetric with the symptomatic side. No evidence of thrombus. Normal
compressibility.

Internal Jugular Vein: No evidence of thrombus. Normal
compressibility, respiratory phasicity and response to augmentation.

Subclavian Vein: No evidence of thrombus. Normal compressibility,
respiratory phasicity and response to augmentation.

Axillary Vein: No evidence of thrombus. Normal compressibility,
respiratory phasicity and response to augmentation.

Cephalic Vein: No evidence of thrombus. Normal compressibility,
respiratory phasicity and response to augmentation.

Basilic Vein: No evidence of thrombus. Normal compressibility,
respiratory phasicity and response to augmentation.

Brachial Veins: No evidence of thrombus. Normal compressibility,
respiratory phasicity and response to augmentation.

Radial Veins: No evidence of thrombus. Normal compressibility,
respiratory phasicity and response to augmentation.

Ulnar Veins: No evidence of thrombus. Normal compressibility,
respiratory phasicity and response to augmentation.
IMPRESSION: No evidence of DVT within the right upper extremity.

## 2022-08-26 MED ORDER — ACETAMINOPHEN 500 MG PO TABS
500.0000 mg | ORAL_TABLET | Freq: Once | ORAL | Status: AC
  Administered 2022-08-26: 500 mg via ORAL
  Filled 2022-08-26: qty 1

## 2022-08-26 MED ORDER — GABAPENTIN 100 MG PO CAPS: 100.0000 mg | ORAL_CAPSULE | Freq: Three times a day (TID) | ORAL | 0 refills | Status: DC

## 2022-08-26 MED ORDER — GABAPENTIN 100 MG PO CAPS
100.0000 mg | ORAL_CAPSULE | Freq: Once | ORAL | Status: DC
  Filled 2022-08-26: qty 1

## 2022-08-26 NOTE — ED Notes (Signed)
Discharge paperwork reviewed entirely with patient, including Rx's and follow up care. Pain was under control. Pt verbalized understanding as well as all parties involved. No questions or concerns voiced at the time of discharge. No acute distress noted.   Pt ambulated out to PVA without incident or assistance.

## 2022-08-26 NOTE — ED Triage Notes (Signed)
Pt arrives pov, steady gait, endorses fall x 4 days pta, c/o LT arm pain and swelling, dizziness and diaphoresis. States "LT side of head feels off" today. Speech clear, AO x 4.

## 2022-08-26 NOTE — ED Notes (Signed)
Refused Gabapentin

## 2022-08-26 NOTE — ED Provider Notes (Incomplete)
  [facility] DEPARTMENT Provider Note   CSN: [id number] Arrival date & time: 08/26/22  1112     History {Add pertinent medical, surgical, social history, OB history to HPI:1} Chief Complaint  Patient presents with   Multiple complaints   Fall    Jennifer [name] is a 57 y.o. female.  HPI     Home Medications Prior to Admission medications   Medication Sig Start Date End Date Taking? Authorizing Provider  ACCU-CHEK GUIDE test strip For testing blood sugars 2 times daily. E11.65 08/23/22   [name], MD  Accu-Chek Softclix Lancets lancets 2 (two) times daily. 06/02/22   [provider]  blood glucose meter kit and supplies Dispense based on patient and insurance preference. Use to check blood sugars twice daily E11.9 06/02/22   [name], MD  flunisolide (NASALIDE) 25 MCG/ACT (0.025%) SOLN Place 2 sprays into the nose daily as needed (for stuffy nose). 06/16/22   [name], MD  hydrocortisone (ANUSOL-HC) 2.5 % rectal cream Place 1 Application rectally 2 (two) times daily. Patient not taking: Reported on 08/23/2022 07/21/22   [name], MD  metoprolol tartrate (LOPRESSOR) 50 MG tablet Take 1 tablet (50 mg total) by mouth 2 (two) times daily. 11/24/21   [name], MD  potassium chloride (KLOR-CON) 20 MEQ packet Take 20 mEq by mouth as needed. 06/16/22   [name], MD      Allergies    Azithromycin, Ciprofloxacin, Codeine, Erythromycin base, Sulfa antibiotics, Sulfasalazine, Telmisartan, Ace inhibitors, Aspirin, Atenolol, Avelox [moxifloxacin hcl in nacl], Beta adrenergic blockers, Buspar [buspirone], Butorphanol tartrate, Cetirizine, Clonidine hcl, Cortisone, Erythromycin, Fentanyl, Fluoxetine hcl, Ketorolac tromethamine, Lidocaine, Lisinopril, Metoclopramide hcl, Midazolam, Montelukast, Montelukast sodium, Naproxen, Paroxetine, Penicillins, Pravastatin, Promethazine, Promethazine hcl,  Quinolones, Serotonin reuptake inhibitors (ssris), Sertraline hcl, Stelazine [trifluoperazine], Tobramycin, Trifluoperazine hcl, Atrovent nasal spray [ipratropium], Diltiazem, Iodinated contrast media, Polyethylene glycol 3350, Propoxyphene, Adhesive [tape], Butorphanol, Ceftriaxone, Iron, Metoclopramide, Metronidazole, Other, Prednisone, Prochlorperazine, Venlafaxine, and Zyrtec [cetirizine hcl]    Review of Systems   Review of Systems  Physical Exam Updated Vital Signs Wt 109 kg   LMP 06/25/2013   BMI 43.95 kg/m  Physical Exam  ED Results / Procedures / Treatments   Labs (all labs ordered are listed, but only abnormal results are displayed) Labs Reviewed - No data to display  EKG None  Radiology No results found.  Procedures Procedures  {Document cardiac monitor, telemetry assessment procedure when appropriate:1}  Medications Ordered in ED Medications - No data to display  ED Course/ Medical Decision Making/ A&P   {   Click here for ABCD2, HEART and other calculatorsREFRESH Note before signing :1}                          Medical Decision Making  ***  {Document critical care time when appropriate:1} {Document review of labs and clinical decision tools ie heart score, Chads2Vasc2 etc:1}  {Document your independent review of radiology images, and any outside records:1} {Document your discussion with family members, caretakers, and with consultants:1} {Document social determinants of health affecting pt's care:1} {Document your decision making why or why not admission, treatments were needed:1} Final Clinical Impression(s) / ED Diagnoses Final diagnoses:  None    Rx / DC Orders ED Discharge Orders     None

## 2022-08-26 NOTE — Discharge Instructions (Signed)
Overall your images today do not show any acute injuries.  There are no fractures.  My suspicion is that these are neuropathic/nerve related pain.  If you would like to try I have sent you in a prescription for gabapentin.  Otherwise I strongly encourage you to follow-up with her primary care doctor for pain management.  You can continue Tylenol and ibuprofen.

## 2022-08-26 NOTE — ED Provider Notes (Signed)
[facility] EMERGENCY DEPARTMENT Provider Note   CSN: [id number] Arrival date & time: 08/26/22  1112     History  Chief Complaint  Patient presents with   Lytle Michaels    Jennifer [name] is a 57 y.o. female.  Here with acute on chronic pain.  Pain mostly to the neck, left hip.  She has had a couple new falls recently.  She has has chronic falls.  She is on blood thinners.  Multiple drug allergies unable to take a lot of pain medications.  She continues to have ongoing pins-and-needles feelings on her left arm at times.  Pain mostly to the left hip, neck.  She was seen a couple days ago in the ED as well.  Denies any shortness of breath.  The history is provided by the patient.       Home Medications Prior to Admission medications   Medication Sig Start Date End Date Taking? Authorizing Provider  gabapentin (NEURONTIN) 100 MG capsule Take 1 capsule (100 mg total) by mouth 3 (three) times daily for 7 days. 08/26/22 09/02/22 Yes Leah Thornberry, DO  ACCU-CHEK GUIDE test strip For testing blood sugars 2 times daily. E11.65 08/23/22   [name], MD  Accu-Chek Softclix Lancets lancets 2 (two) times daily. 06/02/22   [provider]  blood glucose meter kit and supplies Dispense based on patient and insurance preference. Use to check blood sugars twice daily E11.9 06/02/22   [name], MD  flunisolide (NASALIDE) 25 MCG/ACT (0.025%) SOLN Place 2 sprays into the nose daily as needed (for stuffy nose). 06/16/22   [name], MD  hydrocortisone (ANUSOL-HC) 2.5 % rectal cream Place 1 Application rectally 2 (two) times daily. Patient not taking: Reported on 08/23/2022 07/21/22   [name], MD  metoprolol tartrate (LOPRESSOR) 50 MG tablet Take 1 tablet (50 mg total) by mouth 2 (two) times daily. 11/24/21   [name], MD  potassium chloride (KLOR-CON) 20 MEQ packet Take 20 mEq by mouth as needed. 06/16/22   [name], MD       Allergies    Azithromycin, Ciprofloxacin, Codeine, Erythromycin base, Sulfa antibiotics, Sulfasalazine, Telmisartan, Ace inhibitors, Aspirin, Atenolol, Avelox [moxifloxacin hcl in nacl], Beta adrenergic blockers, Buspar [buspirone], Butorphanol tartrate, Cetirizine, Clonidine hcl, Cortisone, Erythromycin, Fentanyl, Fluoxetine hcl, Ketorolac tromethamine, Lidocaine, Lisinopril, Metoclopramide hcl, Midazolam, Montelukast, Montelukast sodium, Naproxen, Paroxetine, Penicillins, Pravastatin, Promethazine, Promethazine hcl, Quinolones, Serotonin reuptake inhibitors (ssris), Sertraline hcl, Stelazine [trifluoperazine], Tobramycin, Trifluoperazine hcl, Atrovent nasal spray [ipratropium], Diltiazem, Iodinated contrast media, Polyethylene glycol 3350, Propoxyphene, Adhesive [tape], Butorphanol, Ceftriaxone, Iron, Metoclopramide, Metronidazole, Other, Prednisone, Prochlorperazine, Venlafaxine, and Zyrtec [cetirizine hcl]    Review of Systems   Review of Systems  Physical Exam Updated Vital Signs Wt 109 kg   LMP 06/25/2013   BMI 43.95 kg/m  Physical Exam Vitals and nursing note reviewed.  Constitutional:      General: She is not in acute distress.    Appearance: She is well-developed. She is not ill-appearing.  HENT:     Head: Normocephalic and atraumatic.     Nose: Nose normal.     Mouth/Throat:     Mouth: Mucous membranes are moist.  Eyes:     Extraocular Movements: Extraocular movements intact.     Conjunctiva/sclera: Conjunctivae normal.     Pupils: Pupils are equal, round, and reactive to light.  Cardiovascular:     Rate and Rhythm: Normal rate and regular rhythm.     Heart sounds: No  murmur heard. Pulmonary:     Effort: Pulmonary effort is normal. No respiratory distress.     Breath sounds: Normal breath sounds.  Abdominal:     Palpations: Abdomen is soft.     Tenderness: There is no abdominal tenderness.  Musculoskeletal:        General: No swelling.     Cervical back: Normal  range of motion and neck supple.  Skin:    General: Skin is warm and dry.     Capillary Refill: Capillary refill takes less than 2 seconds.  Neurological:     General: No focal deficit present.     Mental Status: She is alert and oriented to person, place, and time.     Cranial Nerves: No cranial nerve deficit.     Sensory: No sensory deficit.     Motor: No weakness.     Coordination: Coordination normal.     Gait: Gait normal.  Psychiatric:        Mood and Affect: Mood normal.     ED Results / Procedures / Treatments   Labs (all labs ordered are listed, but only abnormal results are displayed) Labs Reviewed - No data to display  EKG None  Radiology CT Cervical Spine Wo Contrast  Result Date: 08/26/2022 CLINICAL DATA:  57 year old female status post fall 4 days ago. Pain, dizziness. EXAM: CT CERVICAL SPINE WITHOUT CONTRAST TECHNIQUE: Multidetector CT imaging of the cervical spine was performed without intravenous contrast. Multiplanar CT image reconstructions were also generated. RADIATION DOSE REDUCTION: This exam was performed according to the departmental dose-optimization program which includes automated exposure control, adjustment of the mA and/or kV according to patient size and/or use of iterative reconstruction technique. COMPARISON:  Head CT today reported separately. Cervical spine CT 09/02/2020. FINDINGS: Alignment: Stable and relatively normal cervical lordosis. Cervicothoracic junction alignment is within normal limits. Bilateral posterior element alignment is within normal limits. Skull base and vertebrae: Visualized skull base is intact. No atlanto-occipital dissociation. TMJ motion artifact. C1 and C2 appear intact and aligned. No acute osseous abnormality identified. Soft tissues and spinal canal: No prevertebral fluid or swelling. No visible canal hematoma. Negative noncontrast neck soft tissues. Disc levels: Chronic degeneration at the anterior C1-odontoid with  sclerosis and osteophytosis. Elsewhere no age advanced degeneration. No CT evidence of cervical spinal stenosis. Upper chest: Negative. IMPRESSION: No acute traumatic injury identified in the cervical spine. Electronically Signed   By: Genevie Ann M.D.   On: 08/26/2022 12:25   CT Head Wo Contrast  Result Date: 08/26/2022 CLINICAL DATA:  57 year old female status post fall 4 days ago. Pain, dizziness, abnormal sensation left side of head. EXAM: CT HEAD WITHOUT CONTRAST TECHNIQUE: Contiguous axial images were obtained from the base of the skull through the vertex without intravenous contrast. RADIATION DOSE REDUCTION: This exam was performed according to the departmental dose-optimization program which includes automated exposure control, adjustment of the mA and/or kV according to patient size and/or use of iterative reconstruction technique. COMPARISON:  Head CT 02/11/2021 and earlier. FINDINGS: Brain: Small chronic posterior left para falcine meningioma appears not significantly changed, with no associated cerebral edema on series 2, image 26. Underlying cerebral volume remains normal. No intracranial mass effect or midline shift. No ventriculomegaly. No acute intracranial hemorrhage identified. No cortically based acute infarct identified. Gray-white differentiation is stable throughout the brain, with mild chronic white matter changes better demonstrated by MRI. Vascular: Calcified atherosclerosis at the skull base. No suspicious intracranial vascular hyperdensity. Skull: Negative. Sinuses/Orbits: Visualized paranasal sinuses  and mastoids are clear. Other: Visualized orbits and scalp soft tissues are within normal limits. IMPRESSION: 1. No acute traumatic injury identified. 2. Stable non contrast CT appearance of the brain, including small chronic left para falcine meningioma. Electronically Signed   By: Genevie Ann M.D.   On: 08/26/2022 12:06   DG Thoracic Spine 2 View  Result Date: 08/26/2022 CLINICAL DATA:   Pain EXAM: THORACIC SPINE 2 VIEWS COMPARISON:  02/10/2022 FINDINGS: There is no evidence of thoracic spine fracture. Alignment is normal. Minimal endplate spurring within the midthoracic spine. Old healed right sixth rib fracture. No other significant bone abnormalities are identified. IMPRESSION: Negative. Electronically Signed   By: Davina Poke D.O.   On: 08/26/2022 12:05   DG Pelvis 1-2 Views  Result Date: 08/26/2022 CLINICAL DATA:  Pain EXAM: PELVIS - 1-2 VIEW COMPARISON:  08/09/2022 FINDINGS: There is no evidence of pelvic fracture or diastasis. Hip joint spaces are relatively well preserved. Minimal marginal osteophyte formation. No pelvic bone lesions are seen. IMPRESSION: Minimal degenerative changes of the hips. No acute findings. Electronically Signed   By: Davina Poke D.O.   On: 08/26/2022 12:03   DG Lumbar Spine 2-3 Views  Result Date: 08/26/2022 CLINICAL DATA:  Pain EXAM: LUMBAR SPINE - 2 VIEW COMPARISON:  07/03/2021 FINDINGS: There is no evidence of lumbar spine fracture. Alignment is normal. Intervertebral disc spaces are maintained. IMPRESSION: Negative. Electronically Signed   By: Sammie Bench M.D.   On: 08/26/2022 12:03    Procedures Procedures    Medications Ordered in ED Medications  acetaminophen (TYLENOL) tablet 500 mg (500 mg Oral Given 08/26/22 1207)    ED Course/ Medical Decision Making/ A&P                             Medical Decision Making Amount and/or Complexity of Data Reviewed Radiology: ordered.  Risk OTC drugs. Prescription drug management.   KIMMI ACOCELLA is here for evaluation after falls.  Normal vitals.  No fever.  Blood work couple days ago normal.  Multiple ED visits for similar complaints in the past.  I am pretty well-known to this patient.  She is fairly consistent about having discomfort in her left upper arm and having knots and swelling in her left arm.  She is having some worsening pain since some recent falls.  She is got  multiple drug allergies.  I do not feel the need to give her any narcotics unless there is a true organic fracture.  I have offered her steroids, muscle relaxants Toradol and Tylenol and she refused.  She had a CT scan of her head and neck and x-rays of her back and hip that per radiology report are all unremarkable.  Overall I suspect that these are may be nerve related/arthritic related pains.  They seem acute on chronic in nature.  Pain management is very difficult with this patient.  I do not think narcotics are safe for her.  I will have her trial gabapentin and have her follow-up with her primary care doctor.  She has a normal neurological exam.  She has no difficulty walking.  She appears very well.  I continue that she follow with her primary care doctor for chronic pain management.  I have no concern for stroke or other acute problems.  This chart was dictated using voice recognition software.  Despite best efforts to proofread,  errors can occur which can change the documentation meaning.  Final Clinical Impression(s) / ED Diagnoses Final diagnoses:  Fall, initial encounter    Rx / DC Orders ED Discharge Orders          Ordered    gabapentin (NEURONTIN) 100 MG capsule  3 times daily        08/26/22 1213              Finley, DO 08/26/22 1228

## 2022-08-26 NOTE — ED Notes (Signed)
No acute distress. Warm and dry skin. No SOB. Chronic complaint of chronic aches and pains. CAOx4. Pt sitting on phone in bed with female bystander in the room.

## 2022-08-26 NOTE — ED Notes (Signed)
Pt said she only wanted to take Gabapentin from her Rx. She "doesn't trust just anybody to give [her Gabapentin]" She was informed that the EDP's operate within strict guidelines and would not risk her safety with a misadministration. Pt said she is only comfortable with Tylenol, so was given Tylenol.

## 2022-08-28 ENCOUNTER — Other Ambulatory Visit: Payer: Self-pay | Admitting: Family Medicine

## 2022-08-28 DIAGNOSIS — I10 Essential (primary) hypertension: Secondary | ICD-10-CM | POA: Diagnosis not present

## 2022-08-28 DIAGNOSIS — R2232 Localized swelling, mass and lump, left upper limb: Secondary | ICD-10-CM | POA: Diagnosis not present

## 2022-08-28 DIAGNOSIS — Z79899 Other long term (current) drug therapy: Secondary | ICD-10-CM | POA: Diagnosis not present

## 2022-08-28 DIAGNOSIS — M79602 Pain in left arm: Secondary | ICD-10-CM | POA: Diagnosis not present

## 2022-08-28 DIAGNOSIS — Z87891 Personal history of nicotine dependence: Secondary | ICD-10-CM | POA: Diagnosis not present

## 2022-08-28 DIAGNOSIS — M7989 Other specified soft tissue disorders: Secondary | ICD-10-CM | POA: Diagnosis not present

## 2022-08-29 ENCOUNTER — Ambulatory Visit: Admit: 2022-08-29 | Payer: Medicare HMO | Admitting: Psychiatry

## 2022-08-29 DIAGNOSIS — M542 Cervicalgia: Secondary | ICD-10-CM | POA: Diagnosis not present

## 2022-08-29 DIAGNOSIS — M25512 Pain in left shoulder: Secondary | ICD-10-CM | POA: Diagnosis not present

## 2022-08-29 DIAGNOSIS — C519 Malignant neoplasm of vulva, unspecified: Secondary | ICD-10-CM

## 2022-08-29 DIAGNOSIS — K9049 Malabsorption due to intolerance, not elsewhere classified: Secondary | ICD-10-CM

## 2022-08-29 DIAGNOSIS — M545 Low back pain, unspecified: Secondary | ICD-10-CM | POA: Diagnosis not present

## 2022-08-29 DIAGNOSIS — G8929 Other chronic pain: Secondary | ICD-10-CM | POA: Diagnosis not present

## 2022-08-29 SURGERY — EXAM UNDER ANESTHESIA
Anesthesia: General

## 2022-08-31 ENCOUNTER — Inpatient Hospital Stay: Payer: Medicare HMO | Admitting: Licensed Clinical Social Worker

## 2022-09-01 DIAGNOSIS — Z20822 Contact with and (suspected) exposure to covid-19: Secondary | ICD-10-CM | POA: Diagnosis not present

## 2022-09-01 DIAGNOSIS — I491 Atrial premature depolarization: Secondary | ICD-10-CM | POA: Diagnosis not present

## 2022-09-01 DIAGNOSIS — R0789 Other chest pain: Secondary | ICD-10-CM | POA: Diagnosis not present

## 2022-09-01 DIAGNOSIS — R4589 Other symptoms and signs involving emotional state: Secondary | ICD-10-CM | POA: Diagnosis not present

## 2022-09-01 DIAGNOSIS — I10 Essential (primary) hypertension: Secondary | ICD-10-CM | POA: Diagnosis not present

## 2022-09-01 DIAGNOSIS — M25519 Pain in unspecified shoulder: Secondary | ICD-10-CM | POA: Diagnosis not present

## 2022-09-01 DIAGNOSIS — R739 Hyperglycemia, unspecified: Secondary | ICD-10-CM | POA: Diagnosis not present

## 2022-09-01 DIAGNOSIS — R1084 Generalized abdominal pain: Secondary | ICD-10-CM | POA: Diagnosis not present

## 2022-09-01 DIAGNOSIS — R079 Chest pain, unspecified: Secondary | ICD-10-CM | POA: Diagnosis not present

## 2022-09-03 DIAGNOSIS — R002 Palpitations: Secondary | ICD-10-CM | POA: Diagnosis not present

## 2022-09-03 DIAGNOSIS — Z91018 Allergy to other foods: Secondary | ICD-10-CM | POA: Diagnosis not present

## 2022-09-03 DIAGNOSIS — I493 Ventricular premature depolarization: Secondary | ICD-10-CM | POA: Diagnosis not present

## 2022-09-03 DIAGNOSIS — Z881 Allergy status to other antibiotic agents status: Secondary | ICD-10-CM | POA: Diagnosis not present

## 2022-09-03 DIAGNOSIS — Z880 Allergy status to penicillin: Secondary | ICD-10-CM | POA: Diagnosis not present

## 2022-09-03 DIAGNOSIS — I491 Atrial premature depolarization: Secondary | ICD-10-CM | POA: Diagnosis not present

## 2022-09-03 DIAGNOSIS — R000 Tachycardia, unspecified: Secondary | ICD-10-CM | POA: Diagnosis not present

## 2022-09-03 DIAGNOSIS — R9431 Abnormal electrocardiogram [ECG] [EKG]: Secondary | ICD-10-CM | POA: Diagnosis not present

## 2022-09-03 DIAGNOSIS — Z886 Allergy status to analgesic agent status: Secondary | ICD-10-CM | POA: Diagnosis not present

## 2022-09-03 DIAGNOSIS — Z87891 Personal history of nicotine dependence: Secondary | ICD-10-CM | POA: Diagnosis not present

## 2022-09-03 DIAGNOSIS — Z888 Allergy status to other drugs, medicaments and biological substances status: Secondary | ICD-10-CM | POA: Diagnosis not present

## 2022-09-03 DIAGNOSIS — R079 Chest pain, unspecified: Secondary | ICD-10-CM | POA: Diagnosis not present

## 2022-09-03 DIAGNOSIS — R55 Syncope and collapse: Secondary | ICD-10-CM | POA: Diagnosis not present

## 2022-09-03 DIAGNOSIS — R451 Restlessness and agitation: Secondary | ICD-10-CM | POA: Diagnosis not present

## 2022-09-03 DIAGNOSIS — R0789 Other chest pain: Secondary | ICD-10-CM | POA: Diagnosis not present

## 2022-09-03 DIAGNOSIS — R42 Dizziness and giddiness: Secondary | ICD-10-CM | POA: Diagnosis not present

## 2022-09-03 DIAGNOSIS — Z882 Allergy status to sulfonamides status: Secondary | ICD-10-CM | POA: Diagnosis not present

## 2022-09-04 DIAGNOSIS — I491 Atrial premature depolarization: Secondary | ICD-10-CM | POA: Diagnosis not present

## 2022-09-06 ENCOUNTER — Other Ambulatory Visit: Payer: Self-pay

## 2022-09-06 DIAGNOSIS — E531 Pyridoxine deficiency: Secondary | ICD-10-CM | POA: Diagnosis not present

## 2022-09-06 DIAGNOSIS — M47816 Spondylosis without myelopathy or radiculopathy, lumbar region: Secondary | ICD-10-CM | POA: Diagnosis not present

## 2022-09-06 DIAGNOSIS — M542 Cervicalgia: Secondary | ICD-10-CM | POA: Diagnosis not present

## 2022-09-06 DIAGNOSIS — R202 Paresthesia of skin: Secondary | ICD-10-CM

## 2022-09-06 DIAGNOSIS — E611 Iron deficiency: Secondary | ICD-10-CM | POA: Diagnosis not present

## 2022-09-06 DIAGNOSIS — E1165 Type 2 diabetes mellitus with hyperglycemia: Secondary | ICD-10-CM

## 2022-09-06 DIAGNOSIS — M47812 Spondylosis without myelopathy or radiculopathy, cervical region: Secondary | ICD-10-CM | POA: Diagnosis not present

## 2022-09-06 DIAGNOSIS — M47817 Spondylosis without myelopathy or radiculopathy, lumbosacral region: Secondary | ICD-10-CM | POA: Diagnosis not present

## 2022-09-07 DIAGNOSIS — R1013 Epigastric pain: Secondary | ICD-10-CM | POA: Diagnosis not present

## 2022-09-07 DIAGNOSIS — S2231XA Fracture of one rib, right side, initial encounter for closed fracture: Secondary | ICD-10-CM | POA: Diagnosis not present

## 2022-09-07 DIAGNOSIS — R112 Nausea with vomiting, unspecified: Secondary | ICD-10-CM | POA: Diagnosis not present

## 2022-09-07 DIAGNOSIS — R000 Tachycardia, unspecified: Secondary | ICD-10-CM | POA: Diagnosis not present

## 2022-09-07 DIAGNOSIS — R0602 Shortness of breath: Secondary | ICD-10-CM | POA: Diagnosis not present

## 2022-09-07 DIAGNOSIS — R0600 Dyspnea, unspecified: Secondary | ICD-10-CM | POA: Diagnosis not present

## 2022-09-07 DIAGNOSIS — I517 Cardiomegaly: Secondary | ICD-10-CM | POA: Diagnosis not present

## 2022-09-08 ENCOUNTER — Ambulatory Visit (INDEPENDENT_AMBULATORY_CARE_PROVIDER_SITE_OTHER): Payer: Medicare HMO | Admitting: Family Medicine

## 2022-09-08 ENCOUNTER — Encounter: Payer: Self-pay | Admitting: Family Medicine

## 2022-09-08 VITALS — BP 155/74 | HR 88 | Ht 62.0 in | Wt 230.0 lb

## 2022-09-08 DIAGNOSIS — R000 Tachycardia, unspecified: Secondary | ICD-10-CM | POA: Diagnosis not present

## 2022-09-08 DIAGNOSIS — R002 Palpitations: Secondary | ICD-10-CM | POA: Diagnosis not present

## 2022-09-08 DIAGNOSIS — R079 Chest pain, unspecified: Secondary | ICD-10-CM | POA: Diagnosis not present

## 2022-09-08 DIAGNOSIS — R9431 Abnormal electrocardiogram [ECG] [EKG]: Secondary | ICD-10-CM | POA: Diagnosis not present

## 2022-09-08 DIAGNOSIS — E1165 Type 2 diabetes mellitus with hyperglycemia: Secondary | ICD-10-CM | POA: Diagnosis not present

## 2022-09-08 DIAGNOSIS — I10 Essential (primary) hypertension: Secondary | ICD-10-CM | POA: Diagnosis not present

## 2022-09-08 DIAGNOSIS — M4696 Unspecified inflammatory spondylopathy, lumbar region: Secondary | ICD-10-CM | POA: Diagnosis not present

## 2022-09-08 DIAGNOSIS — M4692 Unspecified inflammatory spondylopathy, cervical region: Secondary | ICD-10-CM | POA: Diagnosis not present

## 2022-09-08 LAB — POCT GLYCOSYLATED HEMOGLOBIN (HGB A1C): Hemoglobin A1C: 7.6 % — AB (ref 4.0–5.6)

## 2022-09-08 MED ORDER — AMBULATORY NON FORMULARY MEDICATION: 0 refills | Status: DC

## 2022-09-08 MED ORDER — AMBULATORY NON FORMULARY MEDICATION: 99 refills | Status: DC

## 2022-09-08 NOTE — Assessment & Plan Note (Signed)
Keep follow up with cardiology

## 2022-09-08 NOTE — Assessment & Plan Note (Signed)
See up at 7.6 today uncontrolled.  She did not start the medication yet.  She was worried about having to space it from the meal by 30 minutes.  She thinks she still has it at home but will check.  She has lost weight which is fantastic she has been trying to eat more vegetables and eat more healthy which is absolutely phenomenal and I just encouraged her to keep working at it and not give up just because the A1c shifted.  It was also the holidays recently and this is a 3 months average.  But hopefully we can get it moving in the right direction over the next 3 months.

## 2022-09-08 NOTE — Progress Notes (Signed)
Established Patient Office Visit  Subjective   Patient ID: Jennifer VALLELY, female    DOB: Dec 29, 1965  Age: 57 y.o. MRN: [id number]  Chief Complaint  Patient presents with   Hypertension    HPI   Hypertension-she has been having chest pain, dizziness, shortness of breath and heart palpitations.  She is taking her medications.  Brought in her home blood pressure cuff.  Blood pressures include 134/91, 156/100, 112/72.  But she has had several notifications that her rhythm is irregular on the machine.  She says it feels almost like her heart will race but she knows it is not going fast that her heart is beating a little bit more irregularly.  They did go to the ED and they did an EKG there that showed LVH with hyper repull Arity.  She is concerned about those findings she is just having episodes where she will feel like her heart is palpating and then she will feel dizzy and weak most like she is get a pass out.  She says any minimal amount of effort will trigger or exacerbate her symptoms.  In the more she stays upright during the day the worst she feels.  She says even just being up today will trigger her symptoms.  She often has hypokalemia so does have a [facility] that is expired and needs that redone today.  Ports that her sinus symptoms have been flaring a little bit more she has had more drainage and congestion she did do a sinus rinse this morning.  She is also been having a little bit of epigastric discomfort and was given a prescription for Carafate in the ED.  It did help.  She does have a prescription to fill to take over the next couple of weeks if needed.   Iron deficiency-she is scheduled for an iron infusion next week and wants to know if it is okay to hold off on B12 injection until after the iron infusion she just had a B12 checked and it was normal but on the low end of normal at 312.  She also needs a new prescription for a rollator for  her lower extremity weakness that is intermittent.  He also feels like her vertigo and balance has been off a little bit lately.    ROS    Objective:     BP (!) 155/74   Pulse 88   Ht '5\' 2"'$  (1.575 m)   Wt 230 lb (104.3 kg)   LMP 06/25/2013   SpO2 99%   BMI 42.07 kg/m    Physical Exam Vitals and nursing note reviewed.  Constitutional:      Appearance: She is well-developed.  HENT:     Head: Normocephalic and atraumatic.     Right Ear: Tympanic membrane, ear canal and external ear normal.     Left Ear: Tympanic membrane, ear canal and external ear normal.     Nose: Nose normal.     Mouth/Throat:     Pharynx: Oropharynx is clear.  Eyes:     Conjunctiva/sclera: Conjunctivae normal.     Pupils: Pupils are equal, round, and reactive to light.  Neck:     Thyroid: No thyromegaly.  Cardiovascular:     Rate and Rhythm: Normal rate and regular rhythm.     Heart sounds: Normal heart sounds.  Pulmonary:     Effort: Pulmonary effort is normal.     Breath sounds: Normal breath sounds. No  wheezing.  Musculoskeletal:     Cervical back: Neck supple.  Lymphadenopathy:     Cervical: No cervical adenopathy.  Skin:    General: Skin is warm and dry.  Neurological:     Mental Status: She is alert and oriented to person, place, and time.     Comments: Dix-Hallpike maneuver did cause vertigo when she sat up especially to the left.  Some mild symptoms after sitting up on the right.  No nystagmus.  Psychiatric:        Behavior: Behavior normal.      Results for orders placed or performed in visit on 09/08/22  POCT glycosylated hemoglobin (Hb A1C)  Result Value Ref Range   Hemoglobin A1C 7.6 (A) 4.0 - 5.6 %   HbA1c POC (<> result, manual entry)     HbA1c, POC (prediabetic range)     HbA1c, POC (controlled diabetic range)        The ASCVD Risk score (Arnett DK, et al., 2019) failed to calculate for the following reasons:   The patient has a prior MI or stroke diagnosis     Assessment & Plan:   Problem List Items Addressed This Visit       Cardiovascular and Mediastinum   Hypertension with intolerance to multiple antihypertensive drugs - Primary   Relevant Medications   AMBULATORY NON FORMULARY MEDICATION   Other Relevant Orders   Basic metabolic panel     Endocrine   Uncontrolled type 2 diabetes mellitus with hyperglycemia ([place])    See up at 7.6 today uncontrolled.  She did not start the medication yet.  She was worried about having to space it from the meal by 30 minutes.  She thinks she still has it at home but will check.  She has lost weight which is fantastic she has been trying to eat more vegetables and eat more healthy which is absolutely phenomenal and I just encouraged her to keep working at it and not give up just because the A1c shifted.  It was also the holidays recently and this is a 3 months average.  But hopefully we can get it moving in the right direction over the next 3 months.      Relevant Orders   POCT glycosylated hemoglobin (Hb A1C) (Completed)   Other Visit Diagnoses     Abnormal EKG       Relevant Orders   ECHOCARDIOGRAM COMPLETE   EKG 12-Lead      Abnormal EKG showing LVH in the emergency room.  We did repeat her EKG here today in the office.  EKG shows 91 bpm, normal sinus rhythm with nonspecific ST-T wave changes but no acute changes.  We also did a rhythm strip that was also normal.  Will get a get her scheduled for an echocardiogram she has had uncontrolled hypertension for years which could be causing some ventricular hypertrophy.  Vertigo-handout given to do home PT.  I spent 45 minutes on the day of the encounter to include pre-visit record review, face-to-face time with the patient and post visit ordering of test.   No follow-ups on file.    Beatrice Lecher, MD

## 2022-09-08 NOTE — Patient Instructions (Signed)
Prescription given for rollator. Prescription given for standing BMP ordered. Order placed for echocardiogram.

## 2022-09-10 DIAGNOSIS — R000 Tachycardia, unspecified: Secondary | ICD-10-CM | POA: Diagnosis not present

## 2022-09-10 DIAGNOSIS — R519 Headache, unspecified: Secondary | ICD-10-CM | POA: Diagnosis not present

## 2022-09-10 DIAGNOSIS — Z8669 Personal history of other diseases of the nervous system and sense organs: Secondary | ICD-10-CM | POA: Diagnosis not present

## 2022-09-10 DIAGNOSIS — D320 Benign neoplasm of cerebral meninges: Secondary | ICD-10-CM | POA: Diagnosis not present

## 2022-09-10 DIAGNOSIS — R002 Palpitations: Secondary | ICD-10-CM | POA: Diagnosis not present

## 2022-09-10 DIAGNOSIS — R200 Anesthesia of skin: Secondary | ICD-10-CM | POA: Diagnosis not present

## 2022-09-11 ENCOUNTER — Telehealth: Payer: Self-pay | Admitting: Family Medicine

## 2022-09-11 DIAGNOSIS — I10 Essential (primary) hypertension: Secondary | ICD-10-CM | POA: Diagnosis not present

## 2022-09-11 DIAGNOSIS — I517 Cardiomegaly: Secondary | ICD-10-CM | POA: Diagnosis not present

## 2022-09-11 DIAGNOSIS — R519 Headache, unspecified: Secondary | ICD-10-CM | POA: Diagnosis not present

## 2022-09-11 DIAGNOSIS — R0602 Shortness of breath: Secondary | ICD-10-CM | POA: Diagnosis not present

## 2022-09-11 DIAGNOSIS — R200 Anesthesia of skin: Secondary | ICD-10-CM | POA: Diagnosis not present

## 2022-09-11 DIAGNOSIS — Z87891 Personal history of nicotine dependence: Secondary | ICD-10-CM | POA: Diagnosis not present

## 2022-09-11 DIAGNOSIS — R42 Dizziness and giddiness: Secondary | ICD-10-CM | POA: Diagnosis not present

## 2022-09-11 DIAGNOSIS — R000 Tachycardia, unspecified: Secondary | ICD-10-CM | POA: Diagnosis not present

## 2022-09-11 NOTE — Telephone Encounter (Signed)
Returned patients call back, Patient has been scheduled for 09/21/2022 at 11:30 am for Echocardiogram. Appointment will be located at [place], [place] [address] [address], ph: [phone].

## 2022-09-12 DIAGNOSIS — I517 Cardiomegaly: Secondary | ICD-10-CM | POA: Diagnosis not present

## 2022-09-12 DIAGNOSIS — R200 Anesthesia of skin: Secondary | ICD-10-CM | POA: Diagnosis not present

## 2022-09-13 DIAGNOSIS — Z6841 Body Mass Index (BMI) 40.0 and over, adult: Secondary | ICD-10-CM | POA: Diagnosis not present

## 2022-09-13 DIAGNOSIS — R0789 Other chest pain: Secondary | ICD-10-CM | POA: Diagnosis not present

## 2022-09-13 DIAGNOSIS — G478 Other sleep disorders: Secondary | ICD-10-CM | POA: Diagnosis not present

## 2022-09-13 DIAGNOSIS — K5730 Diverticulosis of large intestine without perforation or abscess without bleeding: Secondary | ICD-10-CM | POA: Diagnosis not present

## 2022-09-13 DIAGNOSIS — R5383 Other fatigue: Secondary | ICD-10-CM | POA: Diagnosis not present

## 2022-09-13 DIAGNOSIS — K625 Hemorrhage of anus and rectum: Secondary | ICD-10-CM | POA: Diagnosis not present

## 2022-09-13 DIAGNOSIS — I2510 Atherosclerotic heart disease of native coronary artery without angina pectoris: Secondary | ICD-10-CM | POA: Diagnosis not present

## 2022-09-13 DIAGNOSIS — G4700 Insomnia, unspecified: Secondary | ICD-10-CM | POA: Diagnosis not present

## 2022-09-13 DIAGNOSIS — I4719 Other supraventricular tachycardia: Secondary | ICD-10-CM | POA: Diagnosis not present

## 2022-09-13 DIAGNOSIS — R002 Palpitations: Secondary | ICD-10-CM | POA: Diagnosis not present

## 2022-09-13 DIAGNOSIS — K922 Gastrointestinal hemorrhage, unspecified: Secondary | ICD-10-CM | POA: Diagnosis not present

## 2022-09-13 DIAGNOSIS — R0683 Snoring: Secondary | ICD-10-CM | POA: Diagnosis not present

## 2022-09-13 DIAGNOSIS — R1084 Generalized abdominal pain: Secondary | ICD-10-CM | POA: Diagnosis not present

## 2022-09-13 DIAGNOSIS — N281 Cyst of kidney, acquired: Secondary | ICD-10-CM | POA: Diagnosis not present

## 2022-09-13 DIAGNOSIS — I10 Essential (primary) hypertension: Secondary | ICD-10-CM | POA: Diagnosis not present

## 2022-09-13 DIAGNOSIS — R000 Tachycardia, unspecified: Secondary | ICD-10-CM | POA: Diagnosis not present

## 2022-09-13 DIAGNOSIS — K5731 Diverticulosis of large intestine without perforation or abscess with bleeding: Secondary | ICD-10-CM | POA: Diagnosis not present

## 2022-09-14 DIAGNOSIS — E611 Iron deficiency: Secondary | ICD-10-CM | POA: Diagnosis not present

## 2022-09-14 DIAGNOSIS — I10 Essential (primary) hypertension: Secondary | ICD-10-CM | POA: Diagnosis not present

## 2022-09-14 DIAGNOSIS — R9431 Abnormal electrocardiogram [ECG] [EKG]: Secondary | ICD-10-CM | POA: Diagnosis not present

## 2022-09-15 ENCOUNTER — Telehealth: Payer: Self-pay | Admitting: Family Medicine

## 2022-09-15 DIAGNOSIS — R0789 Other chest pain: Secondary | ICD-10-CM | POA: Diagnosis not present

## 2022-09-15 DIAGNOSIS — R072 Precordial pain: Secondary | ICD-10-CM | POA: Diagnosis not present

## 2022-09-15 DIAGNOSIS — I491 Atrial premature depolarization: Secondary | ICD-10-CM | POA: Diagnosis not present

## 2022-09-15 DIAGNOSIS — R000 Tachycardia, unspecified: Secondary | ICD-10-CM | POA: Diagnosis not present

## 2022-09-15 DIAGNOSIS — R002 Palpitations: Secondary | ICD-10-CM | POA: Diagnosis not present

## 2022-09-15 DIAGNOSIS — I10 Essential (primary) hypertension: Secondary | ICD-10-CM | POA: Diagnosis not present

## 2022-09-15 DIAGNOSIS — F419 Anxiety disorder, unspecified: Secondary | ICD-10-CM | POA: Diagnosis not present

## 2022-09-15 DIAGNOSIS — R079 Chest pain, unspecified: Secondary | ICD-10-CM | POA: Diagnosis not present

## 2022-09-15 DIAGNOSIS — M79675 Pain in left toe(s): Secondary | ICD-10-CM | POA: Diagnosis not present

## 2022-09-15 NOTE — Telephone Encounter (Signed)
Pt called and wants to know if the medicine- Rybelsus '3mg'$  will interfere with iron infusions?  She also wants to know if she can eat with this medicine since it says do not eat for 30 minutes?  Please call pt with this advice.   Thank you

## 2022-09-15 NOTE — Telephone Encounter (Signed)
Okay to take with iron infusions.  It is best absorbed if taken on an empty stomach so yes she would need to take it by itself and then eat 30 minutes later otherwise the medication does not work as well.

## 2022-09-16 DIAGNOSIS — R002 Palpitations: Secondary | ICD-10-CM | POA: Diagnosis not present

## 2022-09-17 DIAGNOSIS — R000 Tachycardia, unspecified: Secondary | ICD-10-CM | POA: Diagnosis not present

## 2022-09-17 DIAGNOSIS — R002 Palpitations: Secondary | ICD-10-CM | POA: Diagnosis not present

## 2022-09-17 DIAGNOSIS — I491 Atrial premature depolarization: Secondary | ICD-10-CM | POA: Diagnosis not present

## 2022-09-17 IMAGING — DX DG KNEE COMPLETE 4+V*L*
4 series · 4 of 4 positions shown · non-contrast
Comparison: 04/11/2021

CLINICAL DATA: Pain, trauma

EXAM:
LEFT KNEE - COMPLETE 4+ VIEW

[knee ap]
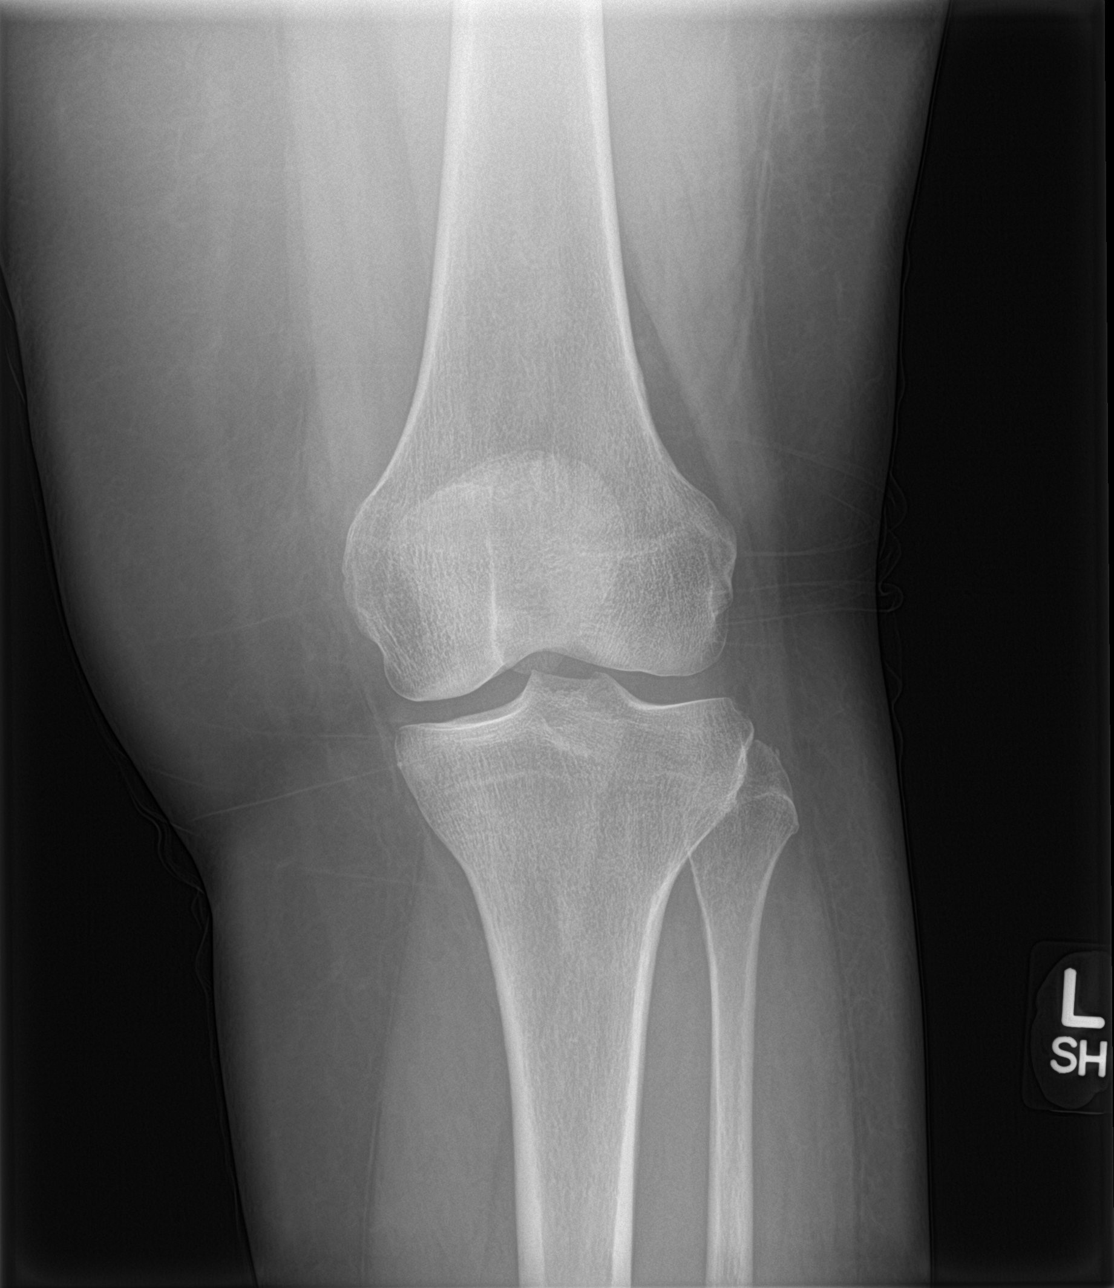

[knee lat]
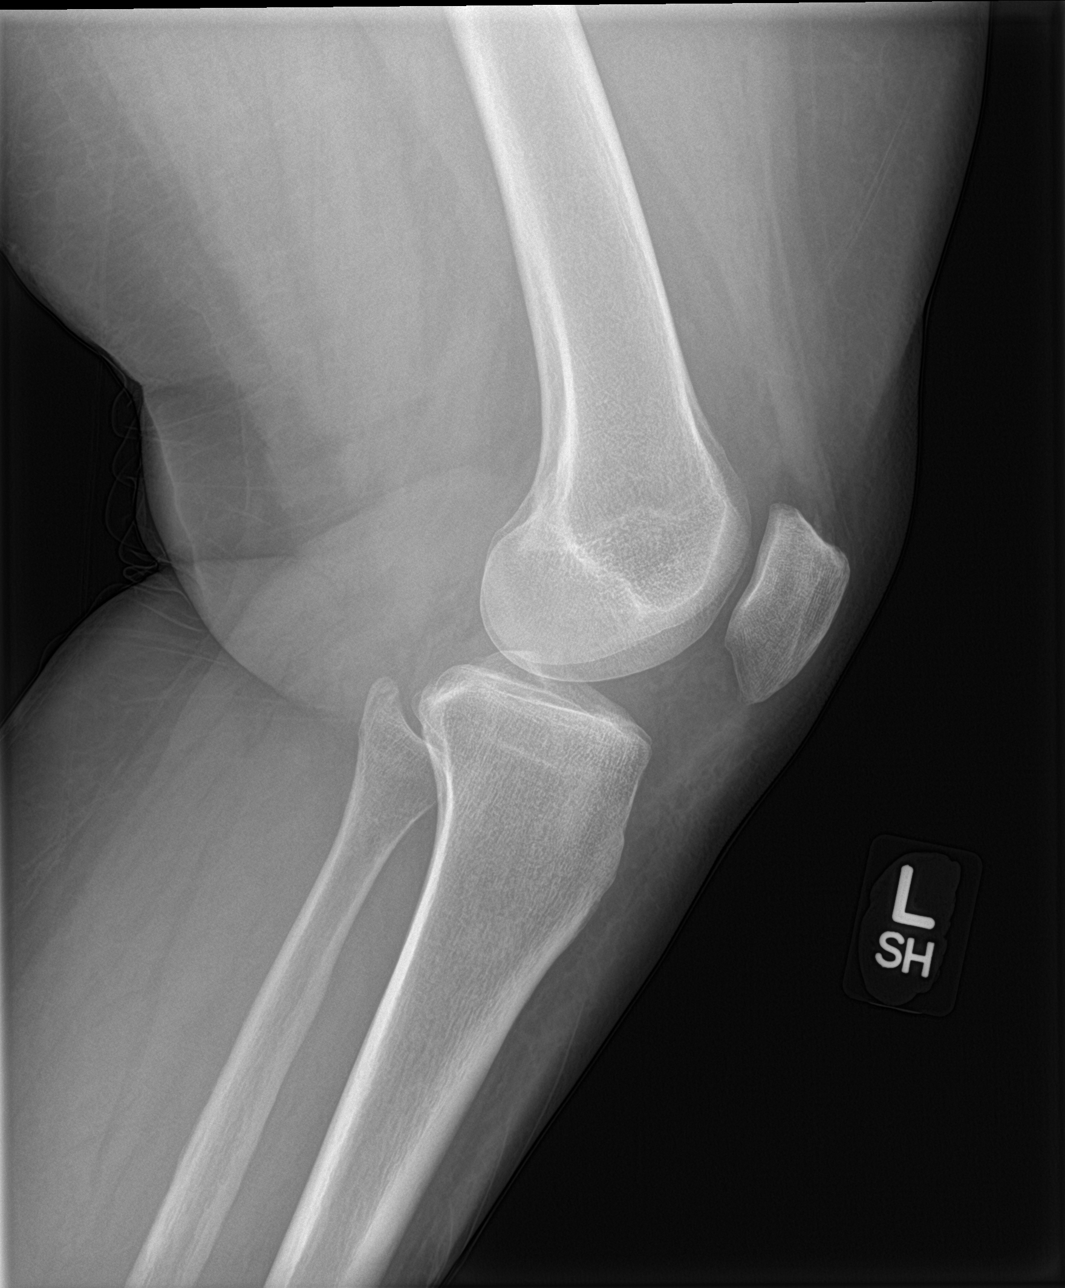

[knee obl (1 of 2)]
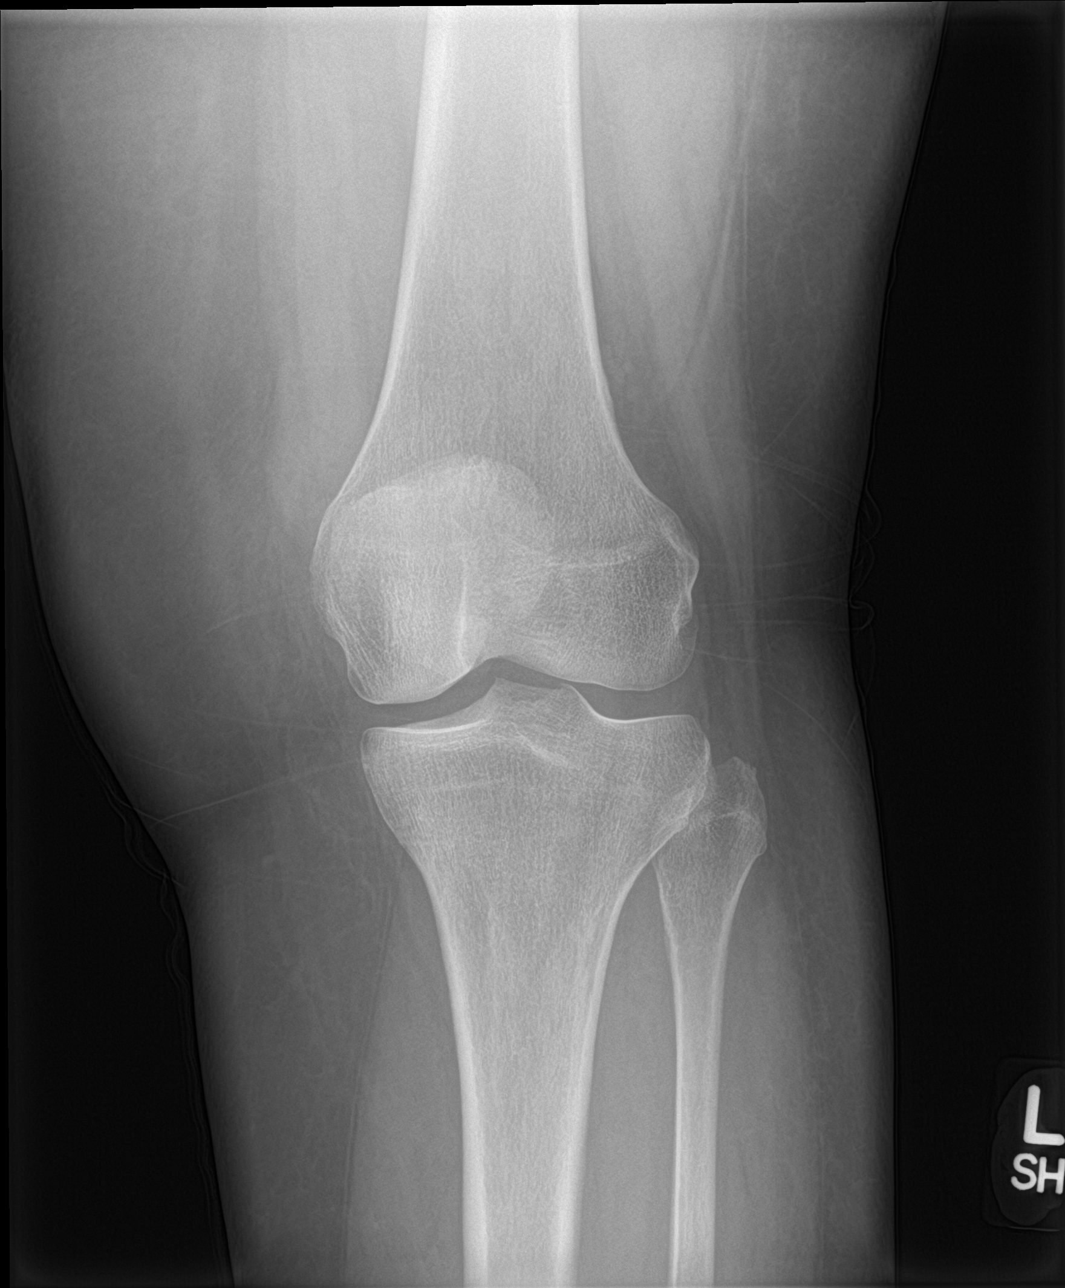

[knee obl (2 of 2)]
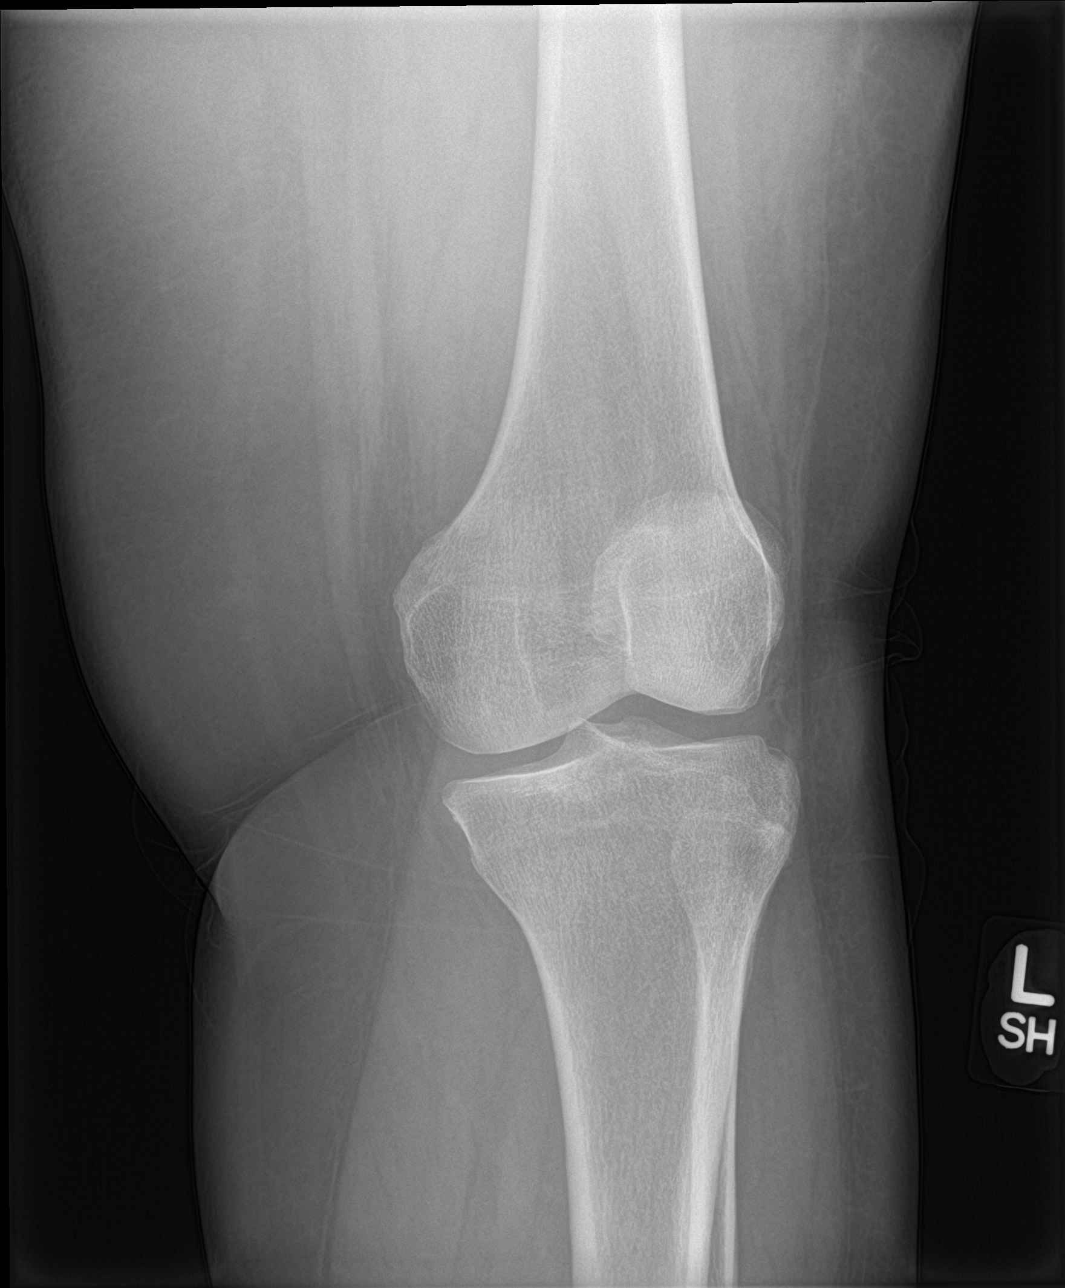

[4 of 4 positions shown; findings below may reference images not displayed]

FINDINGS: No fracture or dislocation is seen. There is no significant
effusion. There are no opaque foreign bodies. No significant
interval changes are noted.
IMPRESSION: No significant radiographic abnormality is seen in the left knee.

## 2022-09-18 DIAGNOSIS — Z87891 Personal history of nicotine dependence: Secondary | ICD-10-CM | POA: Diagnosis not present

## 2022-09-18 DIAGNOSIS — R5383 Other fatigue: Secondary | ICD-10-CM | POA: Diagnosis not present

## 2022-09-18 DIAGNOSIS — I491 Atrial premature depolarization: Secondary | ICD-10-CM | POA: Diagnosis not present

## 2022-09-18 DIAGNOSIS — I10 Essential (primary) hypertension: Secondary | ICD-10-CM | POA: Diagnosis not present

## 2022-09-18 DIAGNOSIS — R079 Chest pain, unspecified: Secondary | ICD-10-CM | POA: Diagnosis not present

## 2022-09-18 DIAGNOSIS — R002 Palpitations: Secondary | ICD-10-CM | POA: Diagnosis not present

## 2022-09-18 DIAGNOSIS — R000 Tachycardia, unspecified: Secondary | ICD-10-CM | POA: Diagnosis not present

## 2022-09-18 DIAGNOSIS — R42 Dizziness and giddiness: Secondary | ICD-10-CM | POA: Diagnosis not present

## 2022-09-18 NOTE — Telephone Encounter (Signed)
Left a vm msg for patient to return a call back to the clinic regarding the provider's recommendation on her inquiry. Direct call back info provided.

## 2022-09-19 ENCOUNTER — Ambulatory Visit: Payer: Medicare HMO | Admitting: Internal Medicine

## 2022-09-19 DIAGNOSIS — I491 Atrial premature depolarization: Secondary | ICD-10-CM | POA: Diagnosis not present

## 2022-09-19 DIAGNOSIS — R9431 Abnormal electrocardiogram [ECG] [EKG]: Secondary | ICD-10-CM | POA: Diagnosis not present

## 2022-09-19 DIAGNOSIS — I493 Ventricular premature depolarization: Secondary | ICD-10-CM | POA: Diagnosis not present

## 2022-09-20 DIAGNOSIS — I2510 Atherosclerotic heart disease of native coronary artery without angina pectoris: Secondary | ICD-10-CM | POA: Diagnosis not present

## 2022-09-20 DIAGNOSIS — K224 Dyskinesia of esophagus: Secondary | ICD-10-CM | POA: Diagnosis not present

## 2022-09-20 DIAGNOSIS — G4733 Obstructive sleep apnea (adult) (pediatric): Secondary | ICD-10-CM | POA: Diagnosis not present

## 2022-09-20 DIAGNOSIS — I700 Atherosclerosis of aorta: Secondary | ICD-10-CM | POA: Diagnosis not present

## 2022-09-20 DIAGNOSIS — J4520 Mild intermittent asthma, uncomplicated: Secondary | ICD-10-CM | POA: Diagnosis not present

## 2022-09-20 DIAGNOSIS — I491 Atrial premature depolarization: Secondary | ICD-10-CM | POA: Diagnosis not present

## 2022-09-20 DIAGNOSIS — I471 Supraventricular tachycardia, unspecified: Secondary | ICD-10-CM | POA: Diagnosis not present

## 2022-09-20 DIAGNOSIS — I10 Essential (primary) hypertension: Secondary | ICD-10-CM | POA: Diagnosis not present

## 2022-09-20 DIAGNOSIS — R000 Tachycardia, unspecified: Secondary | ICD-10-CM | POA: Diagnosis not present

## 2022-09-20 DIAGNOSIS — R002 Palpitations: Secondary | ICD-10-CM | POA: Diagnosis not present

## 2022-09-21 ENCOUNTER — Other Ambulatory Visit: Payer: Medicare HMO | Admitting: Licensed Clinical Social Worker

## 2022-09-21 ENCOUNTER — Ambulatory Visit (HOSPITAL_BASED_OUTPATIENT_CLINIC_OR_DEPARTMENT_OTHER)
Admission: RE | Admit: 2022-09-21 | Discharge: 2022-09-21 | Disposition: A | Discharge status: Home / Self Care | Payer: Medicare HMO | Source: Ambulatory Visit | Attending: Family Medicine | Admitting: Family Medicine

## 2022-09-21 ENCOUNTER — Telehealth: Payer: Self-pay | Admitting: Family Medicine

## 2022-09-21 DIAGNOSIS — R9431 Abnormal electrocardiogram [ECG] [EKG]: Secondary | ICD-10-CM | POA: Insufficient documentation

## 2022-09-21 LAB — ECHOCARDIOGRAM COMPLETE
Area-P 1/2: 3.63 cm2
MV M vel: 4.35 m/s
MV Peak grad: 75.5 mmHg
S' Lateral: 2.5 cm

## 2022-09-21 NOTE — Telephone Encounter (Signed)
Pt called asking to speak to Dr. Madilyn Fireman about her most recent results of her echo and her diabetic medication. Pt received a call about her results and wants further clarification. Please call her back [phone].

## 2022-09-21 NOTE — Progress Notes (Signed)
Hi Jennifer [name], pumping function of the heart looks good at 60 to 65%.  No abnormal movement of the walls.  You do have left ventricular hypertrophy that is considered moderate.  This is again secondary to having high blood pressures over a long period of time.  Overall the valves look good.  Is just some slight abnormal relaxation of the heart as well.  Current recommendation is to work diligently to reduce her blood pressure overall.

## 2022-09-22 ENCOUNTER — Ambulatory Visit: Payer: Medicare HMO | Admitting: Family Medicine

## 2022-09-22 DIAGNOSIS — R42 Dizziness and giddiness: Secondary | ICD-10-CM | POA: Diagnosis not present

## 2022-09-22 NOTE — Telephone Encounter (Signed)
Spoke w/patient about her results. She has an appointment with cards next week. She was asking about another DM medication that she can take that is smaller. I told her that I wasn't sure.  Pt has a f/u with pcp on 2/12

## 2022-09-22 NOTE — Telephone Encounter (Signed)
I am sorry this patient was rude.   I have resulted her myChart and what I said stands. I have no further advice for her. None of her results are critical and they can wait until her follow up to discuss the results in more detail  Again there are not urgent findings and as she needs to continue to work with her Cardiologist to improve her overall BP readings.   As far as phone calls we cannot make an exception. We are a team here and she may get a response from any team member. That is how our clinic works.

## 2022-09-22 NOTE — Telephone Encounter (Signed)
Contacted the patient regarding the provider's recommendation. Patient was very rude from the beginning of the call, stating that the information I was providing to her regarding the Rybelsus and iron infusion was incorrect, that was not her message and that she wants to know why she was not contacted immediately after her message. The patient was notified that a call was made on Monday and there was no answer. A vm msg was left requesting for patient to return a call back and documented in the chart. Patient call me a liar stating she has call waiting, a general vm msg and will pick up her calls if she sees a [facility] number. She then ask me "who are you, again?"  I re-introduced myself to the patient, and informed her of my role at the office. The patient made the comment, "well I don't know you and I don't know why you are calling me". She then proceeded to say that there was no voicemail or missed call.   I informed the patient that these calls are documented and that an attempt was made to reach her on the same day she called the clinic. Patient responded, "well I am going to start tracking the calls I make to Dr. Gardiner Ramus office, since I could never speak to anyone when I call". Then proceeded to yell at me, stating " Number one, I called last week, why I am getting a call today? Number two, I have been calling Triage, the front desk and no answer. I called Tonya's line today and left a message. Why hasn't anybody return my call yet?. Number three, I don't want anyone else in the office other than Tonya or Dr. Madilyn Fireman calling me for my calls or lab results. You all don't know what you are doing or know how to explain a message or results to me".   During this banter, the patient did not allow me to speak, she kept cutting me off and injecting every time I tried to speak to her regarding her concerns. Patient went on about her Echo results, mentioning "I still haven't heard back and the results have  me all worry because your nurse didn't know what she was talking about. I told the patient that a message was sent to the provider and we are still waiting for a response. The patient re-intenerated she no longer wants to speak to anyone at the office unless it is McLain B or Dr. Madilyn Fireman. Patient abruptly ended the call shortly after her statement. Please note, I will forego any interaction with this patient in the future.

## 2022-09-25 ENCOUNTER — Telehealth: Payer: Medicare HMO | Admitting: Family Medicine

## 2022-09-25 DIAGNOSIS — R238 Other skin changes: Secondary | ICD-10-CM | POA: Diagnosis not present

## 2022-09-25 DIAGNOSIS — R9431 Abnormal electrocardiogram [ECG] [EKG]: Secondary | ICD-10-CM | POA: Diagnosis not present

## 2022-09-25 DIAGNOSIS — R42 Dizziness and giddiness: Secondary | ICD-10-CM | POA: Diagnosis not present

## 2022-09-25 NOTE — Telephone Encounter (Signed)
Spoke to patient about her results

## 2022-09-26 DIAGNOSIS — R000 Tachycardia, unspecified: Secondary | ICD-10-CM | POA: Diagnosis not present

## 2022-09-26 DIAGNOSIS — R002 Palpitations: Secondary | ICD-10-CM | POA: Diagnosis not present

## 2022-09-26 DIAGNOSIS — R079 Chest pain, unspecified: Secondary | ICD-10-CM | POA: Diagnosis not present

## 2022-09-26 DIAGNOSIS — Z20822 Contact with and (suspected) exposure to covid-19: Secondary | ICD-10-CM | POA: Diagnosis not present

## 2022-09-26 DIAGNOSIS — R0602 Shortness of breath: Secondary | ICD-10-CM | POA: Diagnosis not present

## 2022-09-27 ENCOUNTER — Encounter: Payer: Self-pay | Admitting: Family Medicine

## 2022-09-27 ENCOUNTER — Telehealth (INDEPENDENT_AMBULATORY_CARE_PROVIDER_SITE_OTHER): Payer: Medicare HMO | Admitting: Family Medicine

## 2022-09-27 DIAGNOSIS — R000 Tachycardia, unspecified: Secondary | ICD-10-CM | POA: Diagnosis not present

## 2022-09-27 DIAGNOSIS — I10 Essential (primary) hypertension: Secondary | ICD-10-CM

## 2022-09-27 DIAGNOSIS — I493 Ventricular premature depolarization: Secondary | ICD-10-CM

## 2022-09-27 DIAGNOSIS — Z7984 Long term (current) use of oral hypoglycemic drugs: Secondary | ICD-10-CM | POA: Diagnosis not present

## 2022-09-27 DIAGNOSIS — E1165 Type 2 diabetes mellitus with hyperglycemia: Secondary | ICD-10-CM | POA: Diagnosis not present

## 2022-09-27 DIAGNOSIS — R079 Chest pain, unspecified: Secondary | ICD-10-CM | POA: Diagnosis not present

## 2022-09-27 NOTE — Progress Notes (Signed)
Virtual Visit via Video Note  I connected with Jennifer [name] on 09/27/22 at 11:30 AM EST by a video enabled telemedicine application and verified that I am speaking with the correct person using two identifiers.   I discussed the limitations of evaluation and management by telemedicine and the availability of in person appointments. The patient expressed understanding and agreed to proceed.  Patient location: at home Provider location: in office  Subjective:    CC:   Chief Complaint  Patient presents with   Follow-up         HPI:  Has felt weak and sick since her iron infusion about 5 days ago. Went to the ED on 2/9, 2/12, 2/13.  Ended up sleeping most of the day, the day before yesterdays.  Says that is not like her. Still having episodes of tachycardia.    Her cardiologist is wanting to change her medication.  She is schedule for a tile table test.  Says metoprolol doesn't bring it down.  Feels it isn't really working to control her PACs.   HTN - BPs are really fluctuating even with taking the metoprolol.    Can't physically do what she used to do and it is very frustrating.   DM - has bought keto bread.  Am glucose was 120 this AM.  Says the Rybelsus is too large and she is worried  about it slowing her gut down when she already had slow stomach and GERD issues.   Thinks she may have been bitten by a bug or spider. Noticed red spot under breast that looks like a pimple afterwards.  Using a steroid cream.   Past medical history, Surgical history, Family history not pertinant except as noted below, Social history, Allergies, and medications have been entered into the medical record, reviewed, and corrections made.    Objective:    General: Speaking clearly in complete sentences without any shortness of breath.  Alert and oriented x3.  Normal judgment. No apparent acute distress.    Impression and Recommendations:    Problem List Items Addressed This Visit        Cardiovascular and Mediastinum   PVC's (premature ventricular contractions)    Frequent PACs and PVCs. Will work with Cards to see how best to control these. Metoprolol is not working.       Hypertension with intolerance to multiple antihypertensive drugs - Primary    Has tilt table schedule on Friday and hoping to guide a good medication regimen for her.         Endocrine   Uncontrolled type 2 diabetes mellitus with hyperglycemia ([place])    Discussed trying a different smaller tab.        Other Visit Diagnoses     Tachycardia            No orders of the defined types were placed in this encounter.   No orders of the defined types were placed in this encounter.    I discussed the assessment and treatment plan with the patient. The patient was provided an opportunity to ask questions and all were answered. The patient agreed with the plan and demonstrated an understanding of the instructions.   The patient was advised to call back or seek an in-person evaluation if the symptoms worsen or if the condition fails to improve as anticipated.  I spent 28 minutes on the day of the encounter to include pre-visit record review, face-to-face time with the patient and post visit  ordering of test.   Beatrice Lecher, MD

## 2022-09-27 NOTE — Assessment & Plan Note (Signed)
Has tilt table schedule on Friday and hoping to guide a good medication regimen for her.

## 2022-09-27 NOTE — Assessment & Plan Note (Signed)
Discussed trying a different smaller tab.

## 2022-09-27 NOTE — Assessment & Plan Note (Signed)
Frequent PACs and PVCs. Will work with Cards to see how best to control these. Metoprolol is not working.

## 2022-09-29 ENCOUNTER — Telehealth: Payer: Self-pay

## 2022-09-29 DIAGNOSIS — R0789 Other chest pain: Secondary | ICD-10-CM | POA: Diagnosis not present

## 2022-09-29 DIAGNOSIS — I4729 Other ventricular tachycardia: Secondary | ICD-10-CM | POA: Diagnosis not present

## 2022-09-29 DIAGNOSIS — I471 Supraventricular tachycardia, unspecified: Secondary | ICD-10-CM | POA: Diagnosis not present

## 2022-09-29 DIAGNOSIS — I10 Essential (primary) hypertension: Secondary | ICD-10-CM | POA: Diagnosis not present

## 2022-09-29 DIAGNOSIS — G4733 Obstructive sleep apnea (adult) (pediatric): Secondary | ICD-10-CM | POA: Diagnosis not present

## 2022-09-29 DIAGNOSIS — Z87891 Personal history of nicotine dependence: Secondary | ICD-10-CM | POA: Diagnosis not present

## 2022-09-29 DIAGNOSIS — K2270 Barrett's esophagus without dysplasia: Secondary | ICD-10-CM | POA: Diagnosis not present

## 2022-09-29 DIAGNOSIS — K224 Dyskinesia of esophagus: Secondary | ICD-10-CM | POA: Diagnosis not present

## 2022-09-29 DIAGNOSIS — R002 Palpitations: Secondary | ICD-10-CM | POA: Diagnosis not present

## 2022-09-29 NOTE — Telephone Encounter (Signed)
Pt called.  Dm med has not been called in.

## 2022-09-29 NOTE — Telephone Encounter (Signed)
Jennifer [name] states that her Blood pressure is not under control as well as her blood sugar.  She wants to cancel her surgery for 11-14-22 with Dr. Ernestina Patches.  She does not know when she can r/s surgery at this time.  Pt overwhelmed with her medical issues. Told her that Dr. Ernestina Patches wanted to see her for f/u with delays of surgery. Pt agreed to set up an appointment on 10-16-22 at 1530 with Dr. Ernestina Patches for follow up. Jennifer Ohlsson' cardiologist is currently Dr. Ola Spurr with [facility]. Reviewed the above with Joylene John, NP.

## 2022-10-01 DIAGNOSIS — R000 Tachycardia, unspecified: Secondary | ICD-10-CM | POA: Diagnosis not present

## 2022-10-01 DIAGNOSIS — M25512 Pain in left shoulder: Secondary | ICD-10-CM | POA: Diagnosis not present

## 2022-10-01 DIAGNOSIS — M545 Low back pain, unspecified: Secondary | ICD-10-CM | POA: Diagnosis not present

## 2022-10-02 DIAGNOSIS — R000 Tachycardia, unspecified: Secondary | ICD-10-CM | POA: Diagnosis not present

## 2022-10-03 ENCOUNTER — Other Ambulatory Visit: Payer: Self-pay

## 2022-10-03 ENCOUNTER — Encounter (HOSPITAL_BASED_OUTPATIENT_CLINIC_OR_DEPARTMENT_OTHER): Payer: Self-pay | Admitting: Emergency Medicine

## 2022-10-03 ENCOUNTER — Emergency Department (HOSPITAL_BASED_OUTPATIENT_CLINIC_OR_DEPARTMENT_OTHER)
Admission: EM | Admit: 2022-10-03 | Discharge: 2022-10-04 | Disposition: A | Discharge status: Home / Self Care | Payer: Medicare HMO | Attending: Emergency Medicine | Admitting: Emergency Medicine

## 2022-10-03 DIAGNOSIS — R1013 Epigastric pain: Secondary | ICD-10-CM | POA: Insufficient documentation

## 2022-10-03 DIAGNOSIS — R002 Palpitations: Secondary | ICD-10-CM | POA: Insufficient documentation

## 2022-10-03 DIAGNOSIS — E119 Type 2 diabetes mellitus without complications: Secondary | ICD-10-CM | POA: Diagnosis not present

## 2022-10-03 DIAGNOSIS — E876 Hypokalemia: Secondary | ICD-10-CM | POA: Insufficient documentation

## 2022-10-03 LAB — CBC WITH DIFFERENTIAL/PLATELET
Abs Immature Granulocytes: 0.03 10*3/uL (ref 0.00–0.07)
Basophils Absolute: 0 10*3/uL (ref 0.0–0.1)
Basophils Relative: 1 %
Eosinophils Absolute: 0.2 10*3/uL (ref 0.0–0.5)
Eosinophils Relative: 3 %
HCT: 43.1 % (ref 36.0–46.0)
Hemoglobin: 14 g/dL (ref 12.0–15.0)
Immature Granulocytes: 1 %
Lymphocytes Relative: 27 %
Lymphs Abs: 1.8 10*3/uL (ref 0.7–4.0)
MCH: 28.6 pg (ref 26.0–34.0)
MCHC: 32.5 g/dL (ref 30.0–36.0)
MCV: 88.1 fL (ref 80.0–100.0)
Monocytes Absolute: 0.5 10*3/uL (ref 0.1–1.0)
Monocytes Relative: 8 %
Neutro Abs: 4 10*3/uL (ref 1.7–7.7)
Neutrophils Relative %: 60 %
Platelets: 175 10*3/uL (ref 150–400)
RBC: 4.89 MIL/uL (ref 3.87–5.11)
RDW: 15.5 % (ref 11.5–15.5)
WBC: 6.6 10*3/uL (ref 4.0–10.5)
nRBC: 0 % (ref 0.0–0.2)

## 2022-10-03 LAB — COMPREHENSIVE METABOLIC PANEL
ALT: 31 U/L (ref 0–44)
AST: 26 U/L (ref 15–41)
Albumin: 3.7 g/dL (ref 3.5–5.0)
Alkaline Phosphatase: 82 U/L (ref 38–126)
Anion gap: 7 (ref 5–15)
BUN: 17 mg/dL (ref 6–20)
CO2: 21 mmol/L — ABNORMAL LOW (ref 22–32)
Calcium: 8.3 mg/dL — ABNORMAL LOW (ref 8.9–10.3)
Chloride: 108 mmol/L (ref 98–111)
Creatinine, Ser: 0.65 mg/dL (ref 0.44–1.00)
GFR, Estimated: >60 mL/min (ref >60)
Glucose, Bld: 214 mg/dL — ABNORMAL HIGH (ref 70–99)
Potassium: 3.4 mmol/L — ABNORMAL LOW (ref 3.5–5.1)
Sodium: 136 mmol/L (ref 135–145)
Total Bilirubin: 0.4 mg/dL (ref 0.3–1.2)
Total Protein: 7 g/dL (ref 6.5–8.1)

## 2022-10-03 LAB — TROPONIN I (HIGH SENSITIVITY): Troponin I (High Sensitivity): 4 ng/L (ref <18)

## 2022-10-03 LAB — CBG MONITORING, ED: Glucose-Capillary: 227 mg/dL — ABNORMAL HIGH (ref 70–99)

## 2022-10-03 NOTE — ED Triage Notes (Signed)
Patient c/o upper abdominal pain for the last few hours. Reports nausea and feeling lightheaded. Denies any vomiting.

## 2022-10-04 LAB — LIPASE, BLOOD: Lipase: 41 U/L (ref 11–51)

## 2022-10-04 MED ORDER — POTASSIUM CHLORIDE 20 MEQ PO PACK
20.0000 meq | PACK | Freq: Once | ORAL | Status: AC
  Administered 2022-10-04: 20 meq via ORAL
  Filled 2022-10-04: qty 1

## 2022-10-04 MED ORDER — ALUM & MAG HYDROXIDE-SIMETH 200-200-20 MG/5ML PO SUSP
30.0000 mL | Freq: Once | ORAL | Status: AC
  Administered 2022-10-04: 30 mL via ORAL
  Filled 2022-10-04: qty 30

## 2022-10-04 NOTE — ED Provider Notes (Signed)
[facility] Provider Note   CSN: [id number] Arrival date & time: 10/03/22  2149     History  Chief Complaint  Patient presents with   Abdominal Pain    Jennifer [name] is a 57 y.o. female.  The history is provided by the patient and medical records.  Abdominal Pain Jennifer [name] is a 57 y.o. female who presents to the Emergency Department complaining of abdominal pain.  She presents to the emergency department complaining of epigastric to right upper quadrant pain.  She has been experiencing intermittent symptoms for 1 month but it is worse today.  She also reports feeling palpitations and irregular heartbeat with a heavy discomfort in her upper abdomen.  She also feels this pain in her chest at times when her heart is irregular.  She also reports lower abdominal bloating.  No fever, vomiting.  She had nausea last night.  No diarrhea but has what she describes as active bowels.  She has a history of Paget's disease MS and diabetes.     Home Medications Prior to Admission medications   Medication Sig Start Date End Date Taking? Authorizing Provider  ACCU-CHEK GUIDE test strip For testing blood sugars 2 times daily. E11.65 08/23/22   [name], MD  Accu-Chek Softclix Lancets lancets 2 (two) times daily. 06/02/22   [provider]  AMBULATORY NON FORMULARY MEDICATION Medication Name: Rollator with seat.  Dx. LE weakness. 09/08/22   [name], MD  AMBULATORY NON FORMULARY MEDICATION Medication Name: standing order BMP x 1 year 09/08/22   [name], MD  blood glucose meter kit and supplies Dispense based on patient and insurance preference. Use to check blood sugars twice daily E11.9 06/02/22   [name], MD  flunisolide (NASALIDE) 25 MCG/ACT (0.025%) SOLN Place 2 sprays into the nose daily as needed (for stuffy nose). 06/16/22   [name], MD  gabapentin (NEURONTIN) 100 MG  capsule Take 1 capsule (100 mg total) by mouth 3 (three) times daily for 7 days. 08/26/22 09/02/22  Curatolo, Adam, DO  metoprolol tartrate (LOPRESSOR) 50 MG tablet TAKE ONE TABLET BY MOUTH TWICE A DAY 08/28/22   [name], MD  potassium chloride (KLOR-CON) 20 MEQ packet Take 20 mEq by mouth as needed. 06/16/22   [name], MD      Allergies    Azithromycin, Ciprofloxacin, Codeine, Erythromycin base, Sulfa antibiotics, Sulfasalazine, Telmisartan, Ace inhibitors, Aspirin, Atenolol, Avelox [moxifloxacin hcl in nacl], Beta adrenergic blockers, Buspar [buspirone], Butorphanol tartrate, Cetirizine, Clonidine hcl, Cortisone, Erythromycin, Fentanyl, Fluoxetine hcl, Ketorolac tromethamine, Lidocaine, Lisinopril, Metoclopramide hcl, Midazolam, Montelukast, Montelukast sodium, Naproxen, Paroxetine, Penicillins, Pravastatin, Promethazine, Promethazine hcl, Quinolones, Serotonin reuptake inhibitors (ssris), Sertraline hcl, Stelazine [trifluoperazine], Tobramycin, Trifluoperazine hcl, Atrovent nasal spray [ipratropium], Diltiazem, Iodinated contrast media, Polyethylene glycol 3350, Propoxyphene, Adhesive [tape], Butorphanol, Ceftriaxone, Iron, Metoclopramide, Metronidazole, Other, Prednisone, Prochlorperazine, Venlafaxine, and Zyrtec [cetirizine hcl]    Review of Systems   Review of Systems  Gastrointestinal:  Positive for abdominal pain.  All other systems reviewed and are negative.   Physical Exam Updated Vital Signs BP (!) 148/89 (BP Location: Left Arm)   Pulse (!) 55   Temp 98.5 F (36.9 C) (Oral)   Resp 19   Ht 5' 2"$  (1.575 m)   Wt 104.3 kg   LMP 06/25/2013   SpO2 99%   BMI 42.07 kg/m  Physical Exam Vitals and nursing note reviewed.  Constitutional:  Appearance: She is well-developed.  HENT:     Head: Normocephalic and atraumatic.  Cardiovascular:     Rate and Rhythm: Normal rate and regular rhythm.     Heart sounds: No murmur heard. Pulmonary:     Effort:  Pulmonary effort is normal. No respiratory distress.     Breath sounds: Normal breath sounds.  Abdominal:     Palpations: Abdomen is soft.     Tenderness: There is no guarding or rebound.     Comments: Mild epigastric tenderness  Musculoskeletal:        General: No tenderness.  Skin:    General: Skin is warm and dry.  Neurological:     Mental Status: She is alert and oriented to person, place, and time.  Psychiatric:        Behavior: Behavior normal.     ED Results / Procedures / Treatments   Labs (all labs ordered are listed, but only abnormal results are displayed) Labs Reviewed  COMPREHENSIVE METABOLIC PANEL - Abnormal; Notable for the following components:      Result Value   Potassium 3.4 (*)    CO2 21 (*)    Glucose, Bld 214 (*)    Calcium 8.3 (*)    All other components within normal limits  CBG MONITORING, ED - Abnormal; Notable for the following components:   Glucose-Capillary 227 (*)    All other components within normal limits  CBC WITH DIFFERENTIAL/PLATELET  LIPASE, BLOOD  TROPONIN I (HIGH SENSITIVITY)    EKG EKG Interpretation  Date/Time:  Tuesday October 03 2022 21:59:39 EST Ventricular Rate:  97 PR Interval:  119 QRS Duration: 108 QT Interval:  347 QTC Calculation: 441 R Axis:   32 Text Interpretation: Sinus rhythm Multiple premature complexes, vent & supraven Borderline short PR interval Low voltage, precordial leads Minimal ST depression, diffuse leads ST changes present on prior EKG Confirmed by Quintella Reichert [phone]) on 10/03/2022 11:08:46 PM  Radiology No results found.  Procedures Procedures    Medications Ordered in ED Medications  alum & mag hydroxide-simeth (MAALOX/MYLANTA) 200-200-20 MG/5ML suspension 30 mL (30 mLs Oral Given 10/04/22 0223)  potassium chloride (KLOR-CON) packet 20 mEq (20 mEq Oral Given 10/04/22 0224)    ED Course/ Medical Decision Making/ A&P                             Medical Decision Making Amount and/or  Complexity of Data Reviewed Labs: ordered.  Risk OTC drugs. Prescription drug management.   Patient here for evaluation of epigastric and right upper quadrant pain that has been intermittent for 1 month, worse today.  Also complains of palpitations.  She is status postcholecystectomy.  She does have a history of palpitations in the past and is currently following with cardiology for additional workup.  On extensive chart review she has had multiple workups for abdominal pain in the past as well as her cardiac symptoms.  Labs here are significant for mild hypokalemia-replace orally.  No evidence of acute pancreatitis.  Presentation is not consistent with SBO, cholangitis, bowel perforation, diverticulitis, ACS.  Patient does have PACs and supraventricular complexes on her EKG-history of same in the past and this is what she follows with cardiology for.  No evidence of life-threatening arrhythmia at this time.  Discussed home care for epigastric discomfort and palpitations.  Discussed continuing her follow-up with GI and cardiology as scheduled with return precautions.        Final  Clinical Impression(s) / ED Diagnoses Final diagnoses:  Epigastric pain  Palpitations    Rx / DC Orders ED Discharge Orders     None         Quintella Reichert, MD 10/04/22 [phone]

## 2022-10-05 MED ORDER — SITAGLIPTIN PHOSPHATE 25 MG PO TABS: 25.0000 mg | ORAL_TABLET | Freq: Every day | ORAL | 0 refills | Status: DC

## 2022-10-05 NOTE — Telephone Encounter (Signed)
I will send in [place]. It may not be smaller than the Rybelsus. The Rybelsus isn't overly large so she can see

## 2022-10-06 ENCOUNTER — Ambulatory Visit (INDEPENDENT_AMBULATORY_CARE_PROVIDER_SITE_OTHER): Payer: Medicare HMO | Admitting: Family Medicine

## 2022-10-06 ENCOUNTER — Encounter: Payer: Self-pay | Admitting: Family Medicine

## 2022-10-06 VITALS — BP 172/77 | HR 83 | Ht 62.0 in | Wt 238.0 lb

## 2022-10-06 DIAGNOSIS — R0602 Shortness of breath: Secondary | ICD-10-CM | POA: Diagnosis not present

## 2022-10-06 DIAGNOSIS — K219 Gastro-esophageal reflux disease without esophagitis: Secondary | ICD-10-CM

## 2022-10-06 DIAGNOSIS — E860 Dehydration: Secondary | ICD-10-CM | POA: Diagnosis not present

## 2022-10-06 DIAGNOSIS — R0789 Other chest pain: Secondary | ICD-10-CM | POA: Diagnosis not present

## 2022-10-06 DIAGNOSIS — I471 Supraventricular tachycardia, unspecified: Secondary | ICD-10-CM | POA: Diagnosis not present

## 2022-10-06 DIAGNOSIS — R1013 Epigastric pain: Secondary | ICD-10-CM | POA: Diagnosis not present

## 2022-10-06 DIAGNOSIS — R1010 Upper abdominal pain, unspecified: Secondary | ICD-10-CM | POA: Diagnosis not present

## 2022-10-06 DIAGNOSIS — I10 Essential (primary) hypertension: Secondary | ICD-10-CM

## 2022-10-06 DIAGNOSIS — I499 Cardiac arrhythmia, unspecified: Secondary | ICD-10-CM | POA: Diagnosis not present

## 2022-10-06 DIAGNOSIS — I459 Conduction disorder, unspecified: Secondary | ICD-10-CM

## 2022-10-06 DIAGNOSIS — I491 Atrial premature depolarization: Secondary | ICD-10-CM | POA: Diagnosis not present

## 2022-10-06 DIAGNOSIS — R002 Palpitations: Secondary | ICD-10-CM | POA: Diagnosis not present

## 2022-10-06 DIAGNOSIS — R1084 Generalized abdominal pain: Secondary | ICD-10-CM | POA: Diagnosis not present

## 2022-10-06 MED ORDER — HYDRALAZINE HCL 10 MG PO TABS: 10.0000 mg | ORAL_TABLET | Freq: Three times a day (TID) | ORAL | 0 refills | Status: DC

## 2022-10-06 NOTE — Progress Notes (Unsigned)
Patient brought in home BP cuffs and 1st BP Cuff- 158/102 P-66, 2nd BP Cuff 148/88 P-85. Patient wanted to know which one was accurate because all of the BP cuffs are showing different and showing she has an irregular HR  Patient has not picked up Diabetic Medication will pick up today.

## 2022-10-07 DIAGNOSIS — R1013 Epigastric pain: Secondary | ICD-10-CM | POA: Diagnosis not present

## 2022-10-07 DIAGNOSIS — I491 Atrial premature depolarization: Secondary | ICD-10-CM | POA: Diagnosis not present

## 2022-10-07 DIAGNOSIS — R000 Tachycardia, unspecified: Secondary | ICD-10-CM | POA: Diagnosis not present

## 2022-10-07 DIAGNOSIS — R079 Chest pain, unspecified: Secondary | ICD-10-CM | POA: Diagnosis not present

## 2022-10-07 DIAGNOSIS — R0602 Shortness of breath: Secondary | ICD-10-CM | POA: Diagnosis not present

## 2022-10-07 DIAGNOSIS — E876 Hypokalemia: Secondary | ICD-10-CM | POA: Diagnosis not present

## 2022-10-07 DIAGNOSIS — R0789 Other chest pain: Secondary | ICD-10-CM | POA: Diagnosis not present

## 2022-10-07 DIAGNOSIS — Z87891 Personal history of nicotine dependence: Secondary | ICD-10-CM | POA: Diagnosis not present

## 2022-10-07 DIAGNOSIS — Z886 Allergy status to analgesic agent status: Secondary | ICD-10-CM | POA: Diagnosis not present

## 2022-10-07 DIAGNOSIS — I498 Other specified cardiac arrhythmias: Secondary | ICD-10-CM | POA: Diagnosis not present

## 2022-10-07 DIAGNOSIS — J45909 Unspecified asthma, uncomplicated: Secondary | ICD-10-CM | POA: Diagnosis not present

## 2022-10-07 DIAGNOSIS — Z881 Allergy status to other antibiotic agents status: Secondary | ICD-10-CM | POA: Diagnosis not present

## 2022-10-07 DIAGNOSIS — R002 Palpitations: Secondary | ICD-10-CM | POA: Diagnosis not present

## 2022-10-07 DIAGNOSIS — E785 Hyperlipidemia, unspecified: Secondary | ICD-10-CM | POA: Diagnosis not present

## 2022-10-07 DIAGNOSIS — Z91010 Allergy to peanuts: Secondary | ICD-10-CM | POA: Diagnosis not present

## 2022-10-07 DIAGNOSIS — I10 Essential (primary) hypertension: Secondary | ICD-10-CM | POA: Diagnosis not present

## 2022-10-07 DIAGNOSIS — Z888 Allergy status to other drugs, medicaments and biological substances status: Secondary | ICD-10-CM | POA: Diagnosis not present

## 2022-10-07 DIAGNOSIS — R0600 Dyspnea, unspecified: Secondary | ICD-10-CM | POA: Diagnosis not present

## 2022-10-07 DIAGNOSIS — I493 Ventricular premature depolarization: Secondary | ICD-10-CM | POA: Diagnosis not present

## 2022-10-08 DIAGNOSIS — R197 Diarrhea, unspecified: Secondary | ICD-10-CM | POA: Diagnosis not present

## 2022-10-09 ENCOUNTER — Telehealth: Payer: Self-pay

## 2022-10-09 NOTE — Telephone Encounter (Signed)
I advised Lutece this is a good blood pressure per Dr Madilyn Fireman and also advised to cut the hydralazine in half three times a day per Dr Madilyn Fireman.   She states she will not see Elmore Guise, NP. She will call and cancel and try to get a sooner appointment with Dr Alroy Dust.

## 2022-10-09 NOTE — Telephone Encounter (Signed)
Jennifer [name] has multiple complaints. She is worried taking the hydralazine 3 times a day is causing her to have dizziness and low blood pressure. The blood pressure has been running around 117/59.   Jennifer [name] called and states she has not been able to sleep in the last week. She reports palpitations with irregular heart beat. She also states when she has the palpitations she has sweats. I advised her to call her cardiologist to schedule an appointment. She states she can not get in until May. I called Dr Blane Ohara office and I was able to get her in with Elmore Guise, NP March 5 th, Tuesday, at 3:30 pm. She is not aware of the appointment.

## 2022-10-10 DIAGNOSIS — I10 Essential (primary) hypertension: Secondary | ICD-10-CM | POA: Diagnosis not present

## 2022-10-10 DIAGNOSIS — R002 Palpitations: Secondary | ICD-10-CM | POA: Diagnosis not present

## 2022-10-10 DIAGNOSIS — I491 Atrial premature depolarization: Secondary | ICD-10-CM | POA: Diagnosis not present

## 2022-10-10 DIAGNOSIS — I4719 Other supraventricular tachycardia: Secondary | ICD-10-CM | POA: Diagnosis not present

## 2022-10-10 NOTE — Assessment & Plan Note (Signed)
She is waiting to hear back from cardiology about being able to come in and be monitored to start the propafenone.  She is nervous about it.

## 2022-10-10 NOTE — Progress Notes (Signed)
Established Patient Office Visit  Subjective   Patient ID: Jennifer [name], female    DOB: [birth year]  Age: 57 y.o. MRN: [id number]  Chief Complaint  Patient presents with   Hypertension    HPI  She has several concerns today.  1 in particular is her blood pressure.  She is willing to try hydralazine since it is very short acting.  She knows that she needs to get her blood pressure down.  She says the amlodipine makes her feel more tachycardic.  She is thinking about putting off the surgery for the vaginal tissue excision.  It is now scheduled about a month out but she just feels like there is so much going on that she does not cannot deal with that right now.  She was just seen in the emergency department 3 days ago for epigastric pain and palpitations.  She has been a little bit more swollen and full in the abdomen with bloating particularly in that right lower quadrant.  She feels like her bowels are moving but she does have chronic constipation history  Very concerned about her palpitations.  She says that she will feel like her heart gets very irregular and then it starts to cause a tightness and a squeezing across her chest that gets really uncomfortable to the point that sometimes she feels like she does need to go to the emergency department.  She did take some potassium last night because she just felt like things were off.  Will very nervous about starting the propafenone that cardiology had written for her.  She had called them back and asked if she could take it in the office and they could observe her for a while in case she has a reaction or side effect.  She has been having a little bit more reflux with epigastric pain as well.  She describes it as a raw burning sensation in her throat.  She does have an upcoming appointment with GI.      ROS    Objective:     BP (!) 172/77   Pulse 83   Ht '5\' 2"'$  (1.575 m)   Wt 238 lb (108 kg)   LMP 06/25/2013   SpO2 94%   BMI  43.53 kg/m    Physical Exam Vitals and nursing note reviewed.  Constitutional:      Appearance: She is well-developed.  HENT:     Head: Normocephalic and atraumatic.  Cardiovascular:     Rate and Rhythm: Normal rate and regular rhythm.     Heart sounds: Normal heart sounds.  Pulmonary:     Effort: Pulmonary effort is normal.     Breath sounds: Normal breath sounds.  Skin:    General: Skin is warm and dry.  Neurological:     Mental Status: She is alert and oriented to person, place, and time.  Psychiatric:        Behavior: Behavior normal.      No results found for any visits on 10/06/22.    The ASCVD Risk score (Arnett DK, et al., 2019) failed to calculate for the following reasons:   The patient has a prior MI or stroke diagnosis    Assessment & Plan:   Problem List Items Addressed This Visit       Cardiovascular and Mediastinum   Hypertension with intolerance to multiple antihypertensive drugs - Primary    Pressure is very uncontrolled here today she is willing to try hydralazine.  Prescription sent to  pharmacy.      Relevant Medications   hydrALAZINE (APRESOLINE) 10 MG tablet   Cardiac conduction disorder    She is waiting to hear back from cardiology about being able to come in and be monitored to start the propafenone.  She is nervous about it.      Relevant Medications   hydrALAZINE (APRESOLINE) 10 MG tablet     Digestive   Gastroesophageal reflux disease without esophagitis    Restarted her reflux medication this should help.  She also has an upcoming appointment with GI.  Encouraged her to make sure that her stools are moving pretty regularly.       No follow-ups on file.   I spent 40 minutes on the day of the encounter to include pre-visit record review, face-to-face time with the patient and post visit ordering of test.   Beatrice Lecher, MD

## 2022-10-10 NOTE — Assessment & Plan Note (Signed)
Restarted her reflux medication this should help.  She also has an upcoming appointment with GI.  Encouraged her to make sure that her stools are moving pretty regularly.

## 2022-10-10 NOTE — Assessment & Plan Note (Signed)
Pressure is very uncontrolled here today she is willing to try hydralazine.  Prescription sent to pharmacy.

## 2022-10-11 DIAGNOSIS — R0789 Other chest pain: Secondary | ICD-10-CM | POA: Diagnosis not present

## 2022-10-11 DIAGNOSIS — I491 Atrial premature depolarization: Secondary | ICD-10-CM | POA: Diagnosis not present

## 2022-10-11 DIAGNOSIS — R000 Tachycardia, unspecified: Secondary | ICD-10-CM | POA: Diagnosis not present

## 2022-10-12 DIAGNOSIS — I491 Atrial premature depolarization: Secondary | ICD-10-CM | POA: Diagnosis not present

## 2022-10-12 NOTE — Telephone Encounter (Signed)
Left message advising of recommendations.  

## 2022-10-13 ENCOUNTER — Ambulatory Visit (INDEPENDENT_AMBULATORY_CARE_PROVIDER_SITE_OTHER): Payer: Medicare HMO | Admitting: Family Medicine

## 2022-10-13 ENCOUNTER — Encounter: Payer: Self-pay | Admitting: Family Medicine

## 2022-10-13 VITALS — BP 168/100 | HR 87 | Ht 62.0 in | Wt 238.6 lb

## 2022-10-13 DIAGNOSIS — I491 Atrial premature depolarization: Secondary | ICD-10-CM | POA: Diagnosis not present

## 2022-10-13 DIAGNOSIS — I480 Paroxysmal atrial fibrillation: Secondary | ICD-10-CM

## 2022-10-13 DIAGNOSIS — R000 Tachycardia, unspecified: Secondary | ICD-10-CM | POA: Diagnosis not present

## 2022-10-13 DIAGNOSIS — R002 Palpitations: Secondary | ICD-10-CM | POA: Diagnosis not present

## 2022-10-13 DIAGNOSIS — E1165 Type 2 diabetes mellitus with hyperglycemia: Secondary | ICD-10-CM | POA: Diagnosis not present

## 2022-10-13 DIAGNOSIS — I471 Supraventricular tachycardia, unspecified: Secondary | ICD-10-CM

## 2022-10-13 DIAGNOSIS — I10 Essential (primary) hypertension: Secondary | ICD-10-CM | POA: Diagnosis not present

## 2022-10-13 DIAGNOSIS — Z79899 Other long term (current) drug therapy: Secondary | ICD-10-CM | POA: Diagnosis not present

## 2022-10-13 DIAGNOSIS — R1013 Epigastric pain: Secondary | ICD-10-CM | POA: Diagnosis not present

## 2022-10-13 DIAGNOSIS — I4589 Other specified conduction disorders: Secondary | ICD-10-CM | POA: Diagnosis not present

## 2022-10-13 NOTE — Patient Instructions (Signed)
Okay to hold hydralazine next dose if systolic pressure is under 110.

## 2022-10-13 NOTE — Assessment & Plan Note (Signed)
Been taking hydralazine half a tab 3 times daily.  We discussed that sometimes when the blood pressure drops significantly that can cause the dizziness.  But the blood pressures themselves look absolutely phenomenal.  She can hold the next dose if her systolic pressure is less than 110.

## 2022-10-13 NOTE — Assessment & Plan Note (Signed)
We discussed possibly starting the medication when she comes back in next time she would like for [place] to observe her for couple hours after she takes it.  We are happy to do so.  Do for repeat A1c in April

## 2022-10-13 NOTE — Progress Notes (Signed)
Established Patient Office Visit  Subjective   Patient ID: Jennifer [name], female    DOB: 1966-07-12  Age: 57 y.o. MRN: [id number]  Chief Complaint  Patient presents with   Follow-up    2 week f/u    HPI  She is here today to follow-up on recent blood pressures.  We recently started hydralazine she took it a few days in a row and her blood pressures were actually going in the 120s she said the lowest pressure she saw was [id number] systolic.  So it made her very nervous she was feeling a little lightheaded with it.  She had called here and we encouraged her to cut it in half.  She did take a dose yesterday morning but nothing since then.  She is also noticed that her heart rates occasionally dropping into the 50s but most of the time it is in the 80s.  She had an episode last night where she felt like her heart was flip-flopping irregularly for almost 2 minutes she almost went to the ED but did not she says after it stopped the heart was more forcefully beating and she just felt exhausted.  She is also felt a little short of breath since then she feels like it is little hard to move air into her upper chest area.  She did see Dr. Virgilio Belling office again and she saw the APP there may again.  She discussed with them being monitored while starting the new antiarrhythmic drug.  They are going to check into it to see if they might be able to do that.  DM -she has not yet started with a diabetic drug.    ROS    Objective:     BP (!) 168/100   Pulse 87   Ht '5\' 2"'$  (1.575 m)   Wt 238 lb 9.6 oz (108.2 kg)   LMP 06/25/2013   SpO2 97%   BMI 43.64 kg/m    Physical Exam Vitals and nursing note reviewed.  Constitutional:      Appearance: She is well-developed.  HENT:     Head: Normocephalic and atraumatic.     Right Ear: External ear normal.     Left Ear: External ear normal.     Nose: Nose normal.  Eyes:     Conjunctiva/sclera: Conjunctivae normal.     Pupils: Pupils are equal,  round, and reactive to light.  Neck:     Thyroid: No thyromegaly.  Cardiovascular:     Rate and Rhythm: Normal rate and regular rhythm.     Heart sounds: Normal heart sounds.  Pulmonary:     Effort: Pulmonary effort is normal.     Breath sounds: Normal breath sounds. No wheezing.  Musculoskeletal:     Cervical back: Neck supple.  Lymphadenopathy:     Cervical: No cervical adenopathy.  Skin:    General: Skin is warm and dry.  Neurological:     Mental Status: She is alert and oriented to person, place, and time.  Psychiatric:        Behavior: Behavior normal.      No results found for any visits on 10/13/22.    The ASCVD Risk score (Arnett DK, et al., 2019) failed to calculate for the following reasons:   The patient has a prior MI or stroke diagnosis    Assessment & Plan:   Problem List Items Addressed This Visit       Cardiovascular and Mediastinum   Hypertension with intolerance to  multiple antihypertensive drugs    Been taking hydralazine half a tab 3 times daily.  We discussed that sometimes when the blood pressure drops significantly that can cause the dizziness.  But the blood pressures themselves look absolutely phenomenal.  She can hold the next dose if her systolic pressure is less than 110.      Other Visit Diagnoses     Supraventricular tachycardia    -  Primary   Relevant Orders   EKG 12-Lead      SVT-she is working with a cardiologist, Dr. Virgilio Belling office.  She is hoping that they would be able to observe her for at least a day or 24 hours after starting new medication.  She is just has a lot of anxiety around trying new medications because she has had a lot of significant side effects to others in the past.  But we discussed right now that her quality life is in such a bad place with how she is feeling that I think at some point she will have to take a leap of faith to be willing to try medication that could help her in the long-term.  EKG today  shows rate of 89 bpm, sinus rhythm with several PA see complexes.  Return in about 1 week (around 10/20/2022) for acute slot to go over BP meds. .   I spent 40 minutes on the day of the encounter to include pre-visit record review, face-to-face time with the patient and post visit ordering of test.  Beatrice Lecher, MD

## 2022-10-16 ENCOUNTER — Inpatient Hospital Stay: Payer: Medicare HMO | Attending: Obstetrics & Gynecology | Admitting: Psychiatry

## 2022-10-16 ENCOUNTER — Other Ambulatory Visit: Payer: Self-pay

## 2022-10-16 ENCOUNTER — Encounter: Payer: Self-pay | Admitting: Psychiatry

## 2022-10-16 VITALS — BP 168/67 | HR 86 | Temp 99.0°F | Resp 16 | Wt 241.3 lb

## 2022-10-16 DIAGNOSIS — R109 Unspecified abdominal pain: Secondary | ICD-10-CM | POA: Diagnosis not present

## 2022-10-16 DIAGNOSIS — Z8542 Personal history of malignant neoplasm of other parts of uterus: Secondary | ICD-10-CM | POA: Insufficient documentation

## 2022-10-16 DIAGNOSIS — E1165 Type 2 diabetes mellitus with hyperglycemia: Secondary | ICD-10-CM | POA: Insufficient documentation

## 2022-10-16 DIAGNOSIS — R002 Palpitations: Secondary | ICD-10-CM | POA: Insufficient documentation

## 2022-10-16 DIAGNOSIS — E876 Hypokalemia: Secondary | ICD-10-CM | POA: Diagnosis not present

## 2022-10-16 DIAGNOSIS — F411 Generalized anxiety disorder: Secondary | ICD-10-CM

## 2022-10-16 DIAGNOSIS — R1030 Lower abdominal pain, unspecified: Secondary | ICD-10-CM | POA: Diagnosis not present

## 2022-10-16 DIAGNOSIS — R1084 Generalized abdominal pain: Secondary | ICD-10-CM | POA: Diagnosis not present

## 2022-10-16 DIAGNOSIS — C519 Malignant neoplasm of vulva, unspecified: Secondary | ICD-10-CM | POA: Diagnosis not present

## 2022-10-16 DIAGNOSIS — Z90710 Acquired absence of both cervix and uterus: Secondary | ICD-10-CM | POA: Insufficient documentation

## 2022-10-16 DIAGNOSIS — K5900 Constipation, unspecified: Secondary | ICD-10-CM | POA: Diagnosis not present

## 2022-10-16 DIAGNOSIS — Z90722 Acquired absence of ovaries, bilateral: Secondary | ICD-10-CM | POA: Diagnosis not present

## 2022-10-16 LAB — BASIC METABOLIC PANEL: Potassium: 3.3 mEq/L — AB (ref 3.5–5.1)
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
## 2022-10-16 NOTE — Patient Instructions (Signed)
We are going to move your surgery from April to February 20, 2023. We will plan on seeing you in the office in June for follow up.

## 2022-10-17 ENCOUNTER — Telehealth (INDEPENDENT_AMBULATORY_CARE_PROVIDER_SITE_OTHER): Payer: Medicare HMO | Admitting: Family Medicine

## 2022-10-17 ENCOUNTER — Encounter: Payer: Self-pay | Admitting: Family Medicine

## 2022-10-17 VITALS — BP 136/84

## 2022-10-17 DIAGNOSIS — E876 Hypokalemia: Secondary | ICD-10-CM | POA: Diagnosis not present

## 2022-10-17 DIAGNOSIS — E878 Other disorders of electrolyte and fluid balance, not elsewhere classified: Secondary | ICD-10-CM | POA: Diagnosis not present

## 2022-10-17 DIAGNOSIS — R81 Glycosuria: Secondary | ICD-10-CM

## 2022-10-17 DIAGNOSIS — R0602 Shortness of breath: Secondary | ICD-10-CM | POA: Diagnosis not present

## 2022-10-17 DIAGNOSIS — I10 Essential (primary) hypertension: Secondary | ICD-10-CM

## 2022-10-17 DIAGNOSIS — F411 Generalized anxiety disorder: Secondary | ICD-10-CM | POA: Diagnosis not present

## 2022-10-17 DIAGNOSIS — R1030 Lower abdominal pain, unspecified: Secondary | ICD-10-CM | POA: Diagnosis not present

## 2022-10-17 NOTE — Assessment & Plan Note (Signed)
Might be cardiac related. She hasn't tried her albuterol bc worried it will inc risk for tachycardia. She says she might call the asthma doc and see if can get in.

## 2022-10-17 NOTE — Assessment & Plan Note (Signed)
Just encouraged her to continue to try taking the hydralazine I really want to see if she is taking it routinely after couple weeks if some of the lightheadedness actually eases off as her body acclimates it does seem to be effective when she uses it.  Really encouraged her to keep a log of her blood pressures as well as when she is actually taking the medication.

## 2022-10-17 NOTE — Progress Notes (Signed)
Virtual Visit via Video Note  I connected with Jennifer [name] on 10/18/22 at 10:10 AM EST by a video enabled telemedicine application and verified that I am speaking with the correct person using two identifiers.   I discussed the limitations of evaluation and management by telemedicine and the availability of in person appointments. The patient expressed understanding and agreed to proceed.  Patient location: at home Provider location: in office  Subjective:    CC:   Chief Complaint  Patient presents with   medication Review    Patient is being seen today for a BP medication Review    HPI: Follow-up on hypertension-she has been trying to take half a tab of the hydralazine here and there. She does report that it does seem to be effective in lowering her blood pressure but it does make her feel woozy when she takes it.  He says even today her blood pressure was high she took it and her blood pressure seems to be trending down and the headache that she had actually seems to be easing off.  She says she is not taking the metoprolol currently.  She has noticed since holding it for several days that some of her tachycardic episodes actually seem to be better.  Not started the propafenone yet.  Diabetes follow-up-she is also not started the diabetic medication yet she wants to bring it in with her at her next appointment in a week to take it on Friday while she is here just so she can be monitored for couple of hours we are happy to do that.  He did go for follow-up of her Paget's with Dr. Ernestina Patches.  She really does not want to push the surgery out because she is worried about increased risk for complications and worsening of the disease process itself.  But for the short-term they have pushed it out 3 months so that she can get cardiac clearance.  Jennifer [name] is still very cautious and anxious about having the surgery and feels very overwhelmed.  But they have moved the surgery to July 9.  He  has felt more short of breath and winded recently.  He did go to the emergency department on March 1 for abdominal pain.  They did do some labs and noted glucose urea as well as low calcium and elevated chloride levels. Potassium was low again.  She feels like she has been running a little bit of a low-grade temperature.  She still struggling with constipation.  No diarrhea.  She did test negative for COVID.  Her glucose in the ED was 201 on a BMP.  This morning it was 113.  Is also planning on try to get back in with Dr. Alroy Dust for cardiology.  Past medical history, Surgical history, Family history not pertinant except as noted below, Social history, Allergies, and medications have been entered into the medical record, reviewed, and corrections made.    Objective:    General: Speaking clearly in complete sentences without any shortness of breath.  Alert and oriented x3.  Normal judgment. No apparent acute distress.    Impression and Recommendations:    Problem List Items Addressed This Visit       Cardiovascular and Mediastinum   Hypertension with intolerance to multiple antihypertensive drugs - Primary    Just encouraged her to continue to try taking the hydralazine I really want to see if she is taking it routinely after couple weeks if some of the lightheadedness actually eases  off as her body acclimates it does seem to be effective when she uses it.  Really encouraged her to keep a log of her blood pressures as well as when she is actually taking the medication.        Other   SOB (shortness of breath)    Might be cardiac related. She hasn't tried her albuterol bc worried it will inc risk for tachycardia. She says she might call the asthma doc and see if can get in.       Hypokalemia   GAD (generalized anxiety disorder)    Still feeling very anxious about pending surgery. Hopefully can get in with a therapist soon.        Other Visit Diagnoses     Hypocalcemia        Glucosuria       High serum chloride          Plan to recheck lab abnormalities at followup including calcium, potassium, and chloride.   Hypokalemia - given potassium in the ED and told to take it for a few days.   No orders of the defined types were placed in this encounter.   No orders of the defined types were placed in this encounter.    I discussed the assessment and treatment plan with the patient. The patient was provided an opportunity to ask questions and all were answered. The patient agreed with the plan and demonstrated an understanding of the instructions.   The patient was advised to call back or seek an in-person evaluation if the symptoms worsen or if the condition fails to improve as anticipated.  I spent 25 minutes on the day of the encounter to include pre-visit record review, face-to-face time with the patient and post visit ordering of test.   Beatrice Lecher, MD

## 2022-10-17 NOTE — Assessment & Plan Note (Signed)
Still feeling very anxious about pending surgery. Hopefully can get in with a therapist soon.

## 2022-10-18 ENCOUNTER — Other Ambulatory Visit: Payer: Self-pay

## 2022-10-18 ENCOUNTER — Ambulatory Visit: Payer: Medicare HMO | Admitting: Internal Medicine

## 2022-10-18 ENCOUNTER — Encounter: Payer: Self-pay | Admitting: Internal Medicine

## 2022-10-18 VITALS — BP 138/88 | HR 93 | Temp 98.6°F | Resp 18 | Ht 62.0 in

## 2022-10-18 DIAGNOSIS — J310 Chronic rhinitis: Secondary | ICD-10-CM | POA: Diagnosis not present

## 2022-10-18 DIAGNOSIS — R0602 Shortness of breath: Secondary | ICD-10-CM | POA: Diagnosis not present

## 2022-10-18 DIAGNOSIS — I491 Atrial premature depolarization: Secondary | ICD-10-CM | POA: Diagnosis not present

## 2022-10-18 DIAGNOSIS — R053 Chronic cough: Secondary | ICD-10-CM | POA: Diagnosis not present

## 2022-10-18 DIAGNOSIS — J383 Other diseases of vocal cords: Secondary | ICD-10-CM | POA: Diagnosis not present

## 2022-10-18 DIAGNOSIS — R079 Chest pain, unspecified: Secondary | ICD-10-CM | POA: Diagnosis not present

## 2022-10-18 DIAGNOSIS — R002 Palpitations: Secondary | ICD-10-CM | POA: Diagnosis not present

## 2022-10-18 DIAGNOSIS — R42 Dizziness and giddiness: Secondary | ICD-10-CM | POA: Diagnosis not present

## 2022-10-18 MED ORDER — FLUNISOLIDE 25 MCG/ACT (0.025%) NA SOLN: 1.0000 | Freq: Every day | NASAL | 5 refills | Status: DC | PRN

## 2022-10-18 MED ORDER — LEVALBUTEROL TARTRATE 45 MCG/ACT IN AERO: 1.0000 | INHALATION_SPRAY | RESPIRATORY_TRACT | 1 refills | Status: DC | PRN
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
## 2022-10-18 NOTE — Addendum Note (Signed)
Addended by: Brandt Loosen, Lelan Pons E on: 10/18/2022 04:36 PM   Modules accepted: Orders

## 2022-10-18 NOTE — Patient Instructions (Addendum)
Return in about 4 months (around 02/17/2023).  Jennifer [name]   Chronic Cough, Shortness of breath Daily controller medication(s): none; does not tolerate any inhaled steroids  May use levoalbuterol rescue inhaler 2 puffs or nebulizer every 4 to 6 hours as needed for shortness of breath, chest tightness, coughing, and wheezing. Monitor frequency of use.  Breathing control goals:  Full participation in all desired activities (may need albuterol before activity) Albuterol use two times or less a week on average (not counting use with activity) Cough interfering with sleep two times or less a month Oral steroids no more than once a year No hospitalizations   Reflux Continue dietary lifestyle modifications. Continue the regimen as prescribed by your gastroenterologist.  Keep a diary to track symptoms and food exposures  Chronic rhinitis Continue flunisolide nasal spray 2 sprays in each nostril once a day a day as needed for stuffy nose.  In the right nostril, point the applicator out toward the right ear. In the left nostril, point the applicator out toward the left ear Continue to perform nasal saline rinses 1-3 times a day as needed to help clean sinus tract - neil med sinus rinses DO NOT USE TAP WATER, can use distilled water or tap water that you boil for several minutes and then let cool prior to use.  Can use salt water gargles to help with phlegm in throat.   Muscle Tension Dysphonia and suspected vocal cord spasm  - these symptoms can worsen if drainage and reflux are uncontrolled - important to keep these symptoms at [place] - continue to follow up with ENT and the swallowing evaluation.   High blood pressure and palpitations  Monitor symptoms and follow up with PCP and cardiology    Vocal Cord Dysfunction Breathing Exercises Use these breathing techniques at any sign of shortness of breath, wheezing, tightness or stridor/noisy breathing. If this occurs during activity, stop  activity, do exercise until it stops and then resume activity gradually. Remember Tightness or stridor can be released by breathing exercises Do exercises easily - don't push shoulders or chest Concentrate on letting air in and out Go into new activities and sports gradually Diaphragmatic breathing exercises Do 10 cycles X3 Practice 2-3 times per day, lying down and sitting up & standing Concentrate on deep diaphragmatic breathing; relaxation of the entire upper body; and increased breath capacity Practice in a quiet environment to help encourage focus Have adult supervision until child can practice with ease on their own Once good breathing practice is established, practice more frequently throughout the day Review your "mental checklist" during breathing exercises Are my face, jaw, tongue relaxed? Is my throat open and relaxed? Are my shoulders relaxed and not moving? Is my chest relaxed and not moving? Is my diaphragm doing all the work: moving out for inhalation and in for exhalation? Is my breathing rate slow and rhythmic? Is my breath full and relaxed (can count to 2 seconds on inhalation and 4-5 seconds on exhalation) Swallow-breathe technique Swallow followed by exhalation and initiation of diaphragmatic breathing. Do 10 full cycles of inhale/exhale. Continue with multiple cycles if needed, until the vocal cord dysfunction goes away. If a vocal cord dysfunction event refuses to be suppressed with this technique, analyze the problem more fully and consult medical assistance, if needed. Relaxed throat breath Do 5 of these relaxed throat breaths in the morning, at noon, before bedtime, before medications, as needed. Hand on abdomen (above the belt or both) when needed Inhale into  abdomen- abdomen comes out Exhale from abdomen-abdomen comes in Inhale with relaxed throat: Tongue on floor of mouth Lips gently closed Jaw gently released Exhale

## 2022-10-20 DIAGNOSIS — K5904 Chronic idiopathic constipation: Secondary | ICD-10-CM | POA: Diagnosis not present

## 2022-10-20 DIAGNOSIS — K921 Melena: Secondary | ICD-10-CM | POA: Diagnosis not present

## 2022-10-20 DIAGNOSIS — E611 Iron deficiency: Secondary | ICD-10-CM | POA: Diagnosis not present

## 2022-10-21 DIAGNOSIS — R079 Chest pain, unspecified: Secondary | ICD-10-CM | POA: Diagnosis not present

## 2022-10-21 DIAGNOSIS — R000 Tachycardia, unspecified: Secondary | ICD-10-CM | POA: Diagnosis not present

## 2022-10-21 DIAGNOSIS — E785 Hyperlipidemia, unspecified: Secondary | ICD-10-CM | POA: Diagnosis not present

## 2022-10-21 DIAGNOSIS — I10 Essential (primary) hypertension: Secondary | ICD-10-CM | POA: Diagnosis not present

## 2022-10-21 DIAGNOSIS — R0789 Other chest pain: Secondary | ICD-10-CM | POA: Diagnosis not present

## 2022-10-21 DIAGNOSIS — M7989 Other specified soft tissue disorders: Secondary | ICD-10-CM | POA: Diagnosis not present

## 2022-10-21 DIAGNOSIS — I471 Supraventricular tachycardia, unspecified: Secondary | ICD-10-CM | POA: Diagnosis not present

## 2022-10-21 DIAGNOSIS — J45909 Unspecified asthma, uncomplicated: Secondary | ICD-10-CM | POA: Diagnosis not present

## 2022-10-21 DIAGNOSIS — F419 Anxiety disorder, unspecified: Secondary | ICD-10-CM | POA: Diagnosis not present

## 2022-10-21 DIAGNOSIS — K219 Gastro-esophageal reflux disease without esophagitis: Secondary | ICD-10-CM | POA: Diagnosis not present

## 2022-10-21 DIAGNOSIS — E119 Type 2 diabetes mellitus without complications: Secondary | ICD-10-CM | POA: Diagnosis not present

## 2022-10-21 DIAGNOSIS — I493 Ventricular premature depolarization: Secondary | ICD-10-CM | POA: Diagnosis not present

## 2022-10-23 DIAGNOSIS — I491 Atrial premature depolarization: Secondary | ICD-10-CM | POA: Diagnosis not present

## 2022-10-23 DIAGNOSIS — I10 Essential (primary) hypertension: Secondary | ICD-10-CM | POA: Diagnosis not present

## 2022-10-23 DIAGNOSIS — R9431 Abnormal electrocardiogram [ECG] [EKG]: Secondary | ICD-10-CM | POA: Diagnosis not present

## 2022-10-23 DIAGNOSIS — R002 Palpitations: Secondary | ICD-10-CM | POA: Diagnosis not present

## 2022-10-25 DIAGNOSIS — I10 Essential (primary) hypertension: Secondary | ICD-10-CM | POA: Diagnosis not present

## 2022-10-25 DIAGNOSIS — I493 Ventricular premature depolarization: Secondary | ICD-10-CM | POA: Diagnosis not present

## 2022-10-25 DIAGNOSIS — I491 Atrial premature depolarization: Secondary | ICD-10-CM | POA: Diagnosis not present

## 2022-10-25 DIAGNOSIS — I4719 Other supraventricular tachycardia: Secondary | ICD-10-CM | POA: Diagnosis not present

## 2022-10-25 DIAGNOSIS — E7800 Pure hypercholesterolemia, unspecified: Secondary | ICD-10-CM | POA: Diagnosis not present

## 2022-10-26 DIAGNOSIS — R002 Palpitations: Secondary | ICD-10-CM | POA: Diagnosis not present

## 2022-10-26 DIAGNOSIS — E611 Iron deficiency: Secondary | ICD-10-CM | POA: Diagnosis not present

## 2022-10-26 DIAGNOSIS — I471 Supraventricular tachycardia, unspecified: Secondary | ICD-10-CM | POA: Diagnosis not present

## 2022-10-27 ENCOUNTER — Ambulatory Visit (INDEPENDENT_AMBULATORY_CARE_PROVIDER_SITE_OTHER): Payer: Medicare HMO | Admitting: Family Medicine

## 2022-10-27 ENCOUNTER — Encounter: Payer: Self-pay | Admitting: Family Medicine

## 2022-10-27 VITALS — BP 174/82 | HR 81 | Ht 62.0 in

## 2022-10-27 DIAGNOSIS — I10 Essential (primary) hypertension: Secondary | ICD-10-CM | POA: Diagnosis not present

## 2022-10-27 DIAGNOSIS — E876 Hypokalemia: Secondary | ICD-10-CM

## 2022-10-27 DIAGNOSIS — R0789 Other chest pain: Secondary | ICD-10-CM

## 2022-10-27 DIAGNOSIS — R1310 Dysphagia, unspecified: Secondary | ICD-10-CM | POA: Diagnosis not present

## 2022-10-27 DIAGNOSIS — E1165 Type 2 diabetes mellitus with hyperglycemia: Secondary | ICD-10-CM | POA: Diagnosis not present

## 2022-10-27 MED ORDER — POTASSIUM CHLORIDE 20 MEQ PO PACK: 20.0000 meq | PACK | ORAL | 2 refills | Status: DC | PRN

## 2022-10-27 NOTE — Progress Notes (Unsigned)
    Virtual Visit via Video Note  I connected with Jennifer SLIGH on 10/27/22 at  3:20 PM EDT by a video enabled telemedicine application and verified that I am speaking with the correct person using two identifiers.   I discussed the limitations of evaluation and management by telemedicine and the availability of in person appointments. The patient expressed understanding and agreed to proceed.  Patient location: at home Provider location: in office  Subjective:    CC:   Chief Complaint  Patient presents with   Hypertension    HPI: She is here today for routine follow-up but she is been having some chest pain since yesterday.  She did take about 500 mg of Tylenol about an hour before she came today.  She feels like it is mostly over the left side of her chest radiating towards the mid sternum.  She is also been having some epigastric pain but has noticed that her bowels have been a little bit more sluggish but feels like she is still going some.  But she definitely feels more bloated today.  She did follow-up with cardiology and saw Dr. Alroy Dust again and they have actually scheduled her for cardiac ablation on April 8.  She has been having lots of coupling with PVCs and when they really become more frequent she has a lot of chest discomfort shortness of breath.  She says when she is not in pain her blood pressure has been better in the 130s.  She is having the chest pain here today in our office.  She did get in with Dr. Dinah Beers assistant and they are scheduling her for colonoscopy in May she is gena try to get them to go ahead and do a endoscopy as well as she has been having some issues with swallowing usually vomiting again.  Recently potassium was low she is getting a new prescription for her potassium packets sent and will likely need a prior authorization as well.    Past medical history, Surgical history, Family history not pertinant except as noted below, Social history,  Allergies, and medications have been entered into the medical record, reviewed, and corrections made.    Objective:    General: Speaking clearly in complete sentences without any shortness of breath.  Alert and oriented x3.  Normal judgment. No apparent acute distress.    Impression and Recommendations:    Problem List Items Addressed This Visit       Other   Hypokalemia - Primary   Chest pain, atypical    No orders of the defined types were placed in this encounter.   Meds ordered this encounter  Medications   potassium chloride (KLOR-CON) 20 MEQ packet    Sig: Take 20 mEq by mouth as needed.    Dispense:  30 each    Refill:  2     I discussed the assessment and treatment plan with the patient. The patient was provided an opportunity to ask questions and all were answered. The patient agreed with the plan and demonstrated an understanding of the instructions.   The patient was advised to call back or seek an in-person evaluation if the symptoms worsen or if the condition fails to improve as anticipated.   Jennifer Lecher, MD

## 2022-10-28 DIAGNOSIS — R002 Palpitations: Secondary | ICD-10-CM | POA: Diagnosis not present

## 2022-10-28 DIAGNOSIS — R0602 Shortness of breath: Secondary | ICD-10-CM | POA: Diagnosis not present

## 2022-10-28 DIAGNOSIS — R9431 Abnormal electrocardiogram [ECG] [EKG]: Secondary | ICD-10-CM | POA: Diagnosis not present

## 2022-10-30 ENCOUNTER — Telehealth: Payer: Self-pay | Admitting: *Deleted

## 2022-10-30 DIAGNOSIS — R9431 Abnormal electrocardiogram [ECG] [EKG]: Secondary | ICD-10-CM | POA: Diagnosis not present

## 2022-10-30 DIAGNOSIS — R000 Tachycardia, unspecified: Secondary | ICD-10-CM | POA: Diagnosis not present

## 2022-10-30 DIAGNOSIS — Z87891 Personal history of nicotine dependence: Secondary | ICD-10-CM | POA: Diagnosis not present

## 2022-10-30 DIAGNOSIS — B349 Viral infection, unspecified: Secondary | ICD-10-CM | POA: Diagnosis not present

## 2022-10-30 DIAGNOSIS — R0789 Other chest pain: Secondary | ICD-10-CM | POA: Diagnosis not present

## 2022-10-30 DIAGNOSIS — R0602 Shortness of breath: Secondary | ICD-10-CM | POA: Diagnosis not present

## 2022-10-30 DIAGNOSIS — I491 Atrial premature depolarization: Secondary | ICD-10-CM | POA: Diagnosis not present

## 2022-10-30 NOTE — Assessment & Plan Note (Signed)
BP uncontrolled today. She hydralazine seem to be effective but makes her feel dizzy at times so hasn't been ttaking it consistently.  Encouraged her to take it regularly. She is not driving.

## 2022-10-30 NOTE — Telephone Encounter (Signed)
Patient called and stated "I wanted to let Dr Ernestina Patches know that I have been scheduled for a heart ablation on 4/8. I see them at the end of the month. Once I know more I will call you back." Message given to Dr Ernestina Patches

## 2022-10-30 NOTE — Assessment & Plan Note (Signed)
She was going to bring in her medicine today so she could take it in tnhe office and make sure she felt OK. She let it at home. Continue to work on very low carb diet, Esp since she cannot exercise regularly.

## 2022-10-31 DIAGNOSIS — I517 Cardiomegaly: Secondary | ICD-10-CM | POA: Diagnosis not present

## 2022-11-01 DIAGNOSIS — R002 Palpitations: Secondary | ICD-10-CM | POA: Diagnosis not present

## 2022-11-01 DIAGNOSIS — I10 Essential (primary) hypertension: Secondary | ICD-10-CM | POA: Diagnosis not present

## 2022-11-01 DIAGNOSIS — R0789 Other chest pain: Secondary | ICD-10-CM | POA: Diagnosis not present

## 2022-11-01 DIAGNOSIS — Z87891 Personal history of nicotine dependence: Secondary | ICD-10-CM | POA: Diagnosis not present

## 2022-11-01 DIAGNOSIS — R079 Chest pain, unspecified: Secondary | ICD-10-CM | POA: Diagnosis not present

## 2022-11-02 DIAGNOSIS — R079 Chest pain, unspecified: Secondary | ICD-10-CM | POA: Diagnosis not present

## 2022-11-03 ENCOUNTER — Emergency Department (HOSPITAL_BASED_OUTPATIENT_CLINIC_OR_DEPARTMENT_OTHER): Payer: Medicare HMO

## 2022-11-03 ENCOUNTER — Encounter (HOSPITAL_BASED_OUTPATIENT_CLINIC_OR_DEPARTMENT_OTHER): Payer: Self-pay | Admitting: Emergency Medicine

## 2022-11-03 ENCOUNTER — Other Ambulatory Visit: Payer: Self-pay

## 2022-11-03 ENCOUNTER — Emergency Department (HOSPITAL_BASED_OUTPATIENT_CLINIC_OR_DEPARTMENT_OTHER)
Admission: EM | Admit: 2022-11-03 | Discharge: 2022-11-03 | Disposition: A | Discharge status: Home / Self Care | Payer: Medicare HMO | Attending: Emergency Medicine | Admitting: Emergency Medicine

## 2022-11-03 DIAGNOSIS — E782 Mixed hyperlipidemia: Secondary | ICD-10-CM | POA: Diagnosis not present

## 2022-11-03 DIAGNOSIS — R0602 Shortness of breath: Secondary | ICD-10-CM | POA: Diagnosis not present

## 2022-11-03 DIAGNOSIS — E531 Pyridoxine deficiency: Secondary | ICD-10-CM | POA: Diagnosis not present

## 2022-11-03 DIAGNOSIS — J45909 Unspecified asthma, uncomplicated: Secondary | ICD-10-CM | POA: Diagnosis not present

## 2022-11-03 DIAGNOSIS — I10 Essential (primary) hypertension: Secondary | ICD-10-CM | POA: Insufficient documentation

## 2022-11-03 DIAGNOSIS — R0789 Other chest pain: Secondary | ICD-10-CM | POA: Diagnosis not present

## 2022-11-03 DIAGNOSIS — R42 Dizziness and giddiness: Secondary | ICD-10-CM | POA: Insufficient documentation

## 2022-11-03 DIAGNOSIS — Z7951 Long term (current) use of inhaled steroids: Secondary | ICD-10-CM | POA: Diagnosis not present

## 2022-11-03 DIAGNOSIS — Z8542 Personal history of malignant neoplasm of other parts of uterus: Secondary | ICD-10-CM | POA: Diagnosis not present

## 2022-11-03 DIAGNOSIS — R000 Tachycardia, unspecified: Secondary | ICD-10-CM | POA: Diagnosis not present

## 2022-11-03 DIAGNOSIS — R002 Palpitations: Secondary | ICD-10-CM | POA: Insufficient documentation

## 2022-11-03 DIAGNOSIS — I4719 Other supraventricular tachycardia: Secondary | ICD-10-CM | POA: Diagnosis not present

## 2022-11-03 DIAGNOSIS — Z79899 Other long term (current) drug therapy: Secondary | ICD-10-CM | POA: Insufficient documentation

## 2022-11-03 DIAGNOSIS — I471 Supraventricular tachycardia, unspecified: Secondary | ICD-10-CM | POA: Diagnosis not present

## 2022-11-03 DIAGNOSIS — E611 Iron deficiency: Secondary | ICD-10-CM | POA: Diagnosis not present

## 2022-11-03 DIAGNOSIS — R079 Chest pain, unspecified: Secondary | ICD-10-CM | POA: Diagnosis not present

## 2022-11-03 DIAGNOSIS — E119 Type 2 diabetes mellitus without complications: Secondary | ICD-10-CM | POA: Insufficient documentation

## 2022-11-03 DIAGNOSIS — J9811 Atelectasis: Secondary | ICD-10-CM | POA: Diagnosis not present

## 2022-11-03 LAB — TROPONIN I (HIGH SENSITIVITY)
Troponin I (High Sensitivity): 5 ng/L (ref <18)
Troponin I (High Sensitivity): 5 ng/L (ref <18)

## 2022-11-03 LAB — BASIC METABOLIC PANEL
Anion gap: 9 (ref 5–15)
BUN: 18 mg/dL (ref 6–20)
CO2: 25 mmol/L (ref 22–32)
Calcium: 8.9 mg/dL (ref 8.9–10.3)
Chloride: 103 mmol/L (ref 98–111)
Creatinine, Ser: 0.89 mg/dL (ref 0.44–1.00)
GFR, Estimated: >60 mL/min (ref >60)
Glucose, Bld: 161 mg/dL — ABNORMAL HIGH (ref 70–99)
Potassium: 3.8 mmol/L (ref 3.5–5.1)
Sodium: 137 mmol/L (ref 135–145)

## 2022-11-03 LAB — CBC
HCT: 42.4 % (ref 36.0–46.0)
Hemoglobin: 14.2 g/dL (ref 12.0–15.0)
MCH: 29.4 pg (ref 26.0–34.0)
MCHC: 33.5 g/dL (ref 30.0–36.0)
MCV: 87.8 fL (ref 80.0–100.0)
Platelets: 196 10*3/uL (ref 150–400)
RBC: 4.83 MIL/uL (ref 3.87–5.11)
RDW: 15.2 % (ref 11.5–15.5)
WBC: 7.6 10*3/uL (ref 4.0–10.5)
nRBC: 0 % (ref 0.0–0.2)

## 2022-11-03 NOTE — ED Triage Notes (Signed)
Pt via POV c/o central chest pain and SOB since just PTA. She notes repeatedly that she is supposed to have an ablation procedure soon. Pt reports dizziness also.

## 2022-11-03 NOTE — Discharge Instructions (Signed)
Follow up with your cardiologist as planned.
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
## 2022-11-04 IMAGING — CR DG CHEST 2V
2 series · 2 of 2 positions shown · non-contrast
Comparison: 08/04/2021

CLINICAL DATA: Shortness of breath.  Chest discomfort.

EXAM:
CHEST - 2 VIEW

[w chest pa]
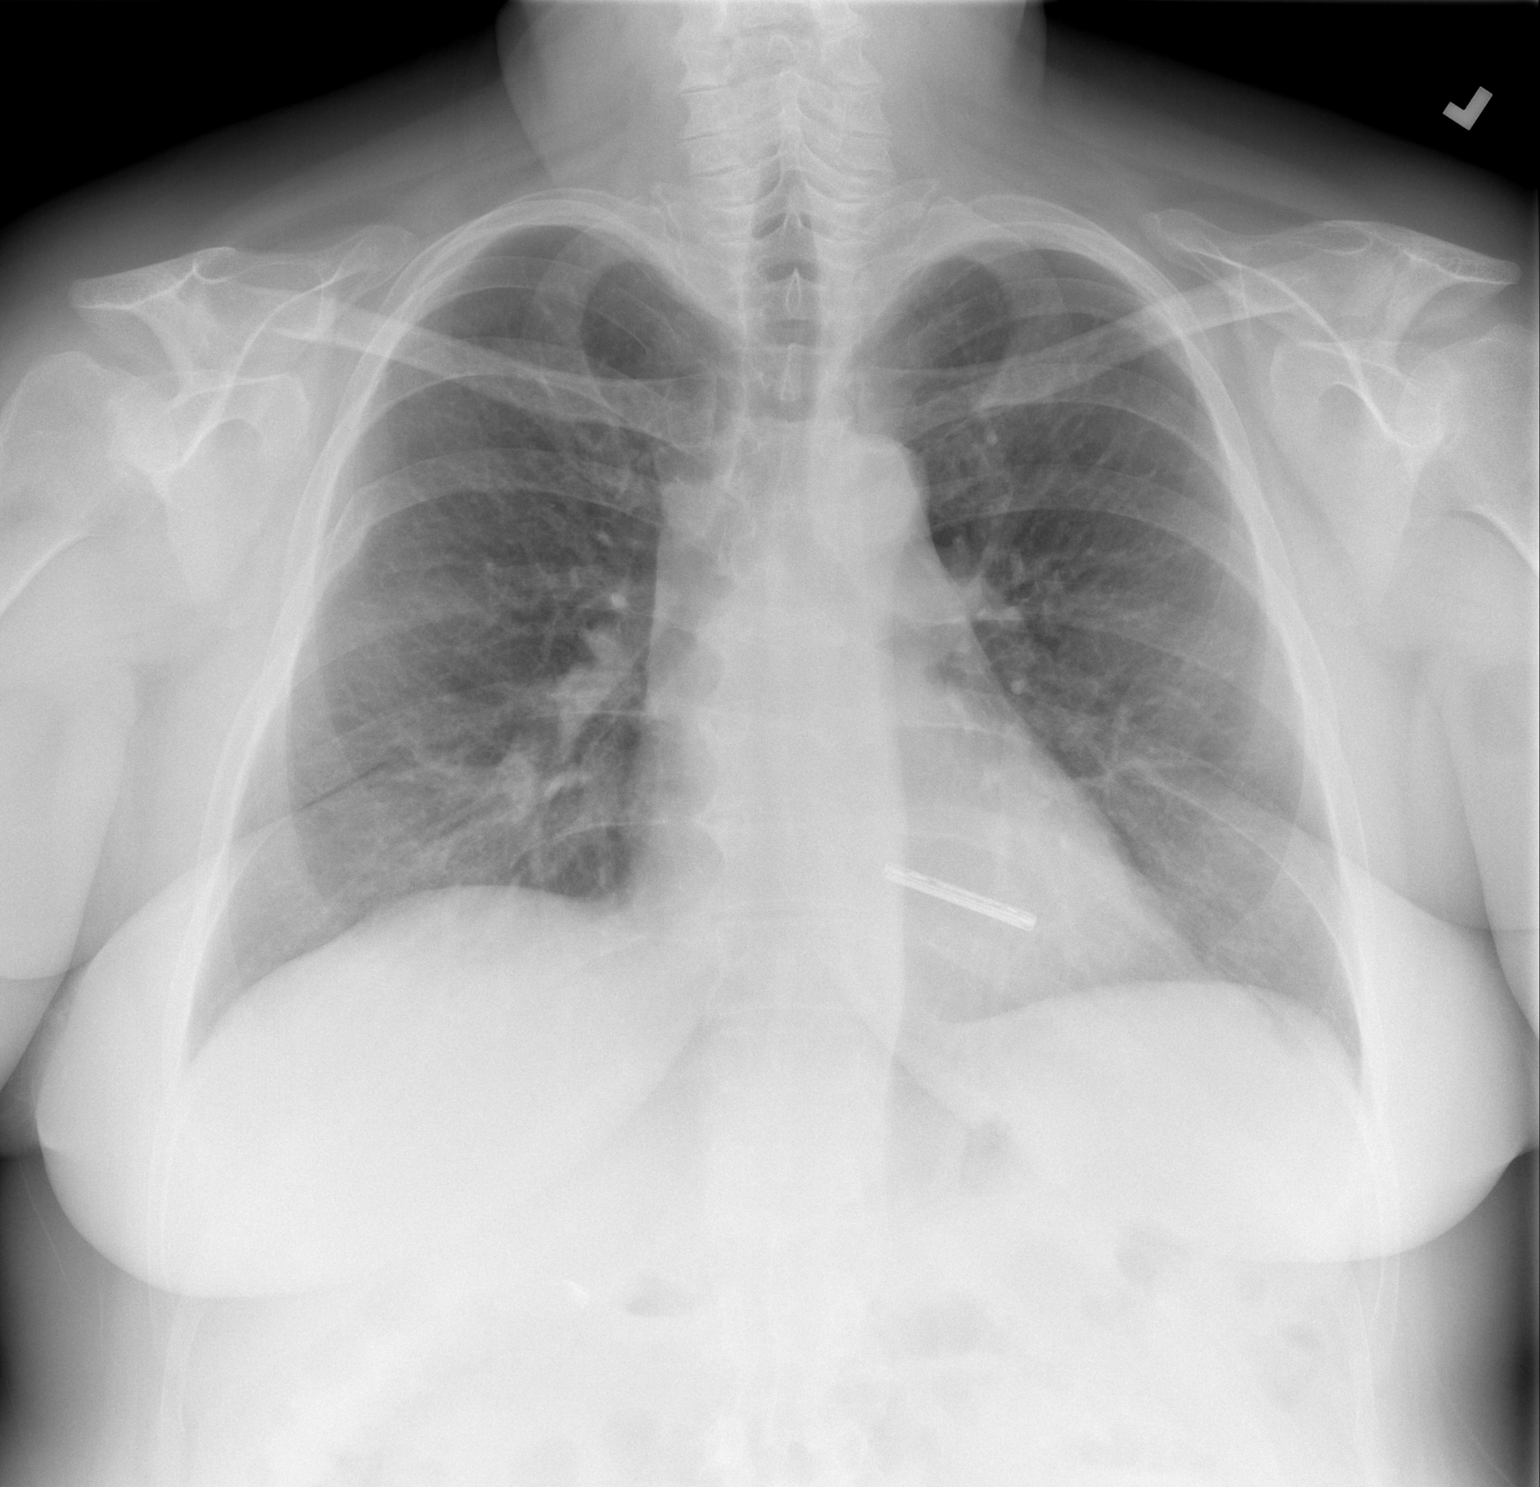

[w chest lat]
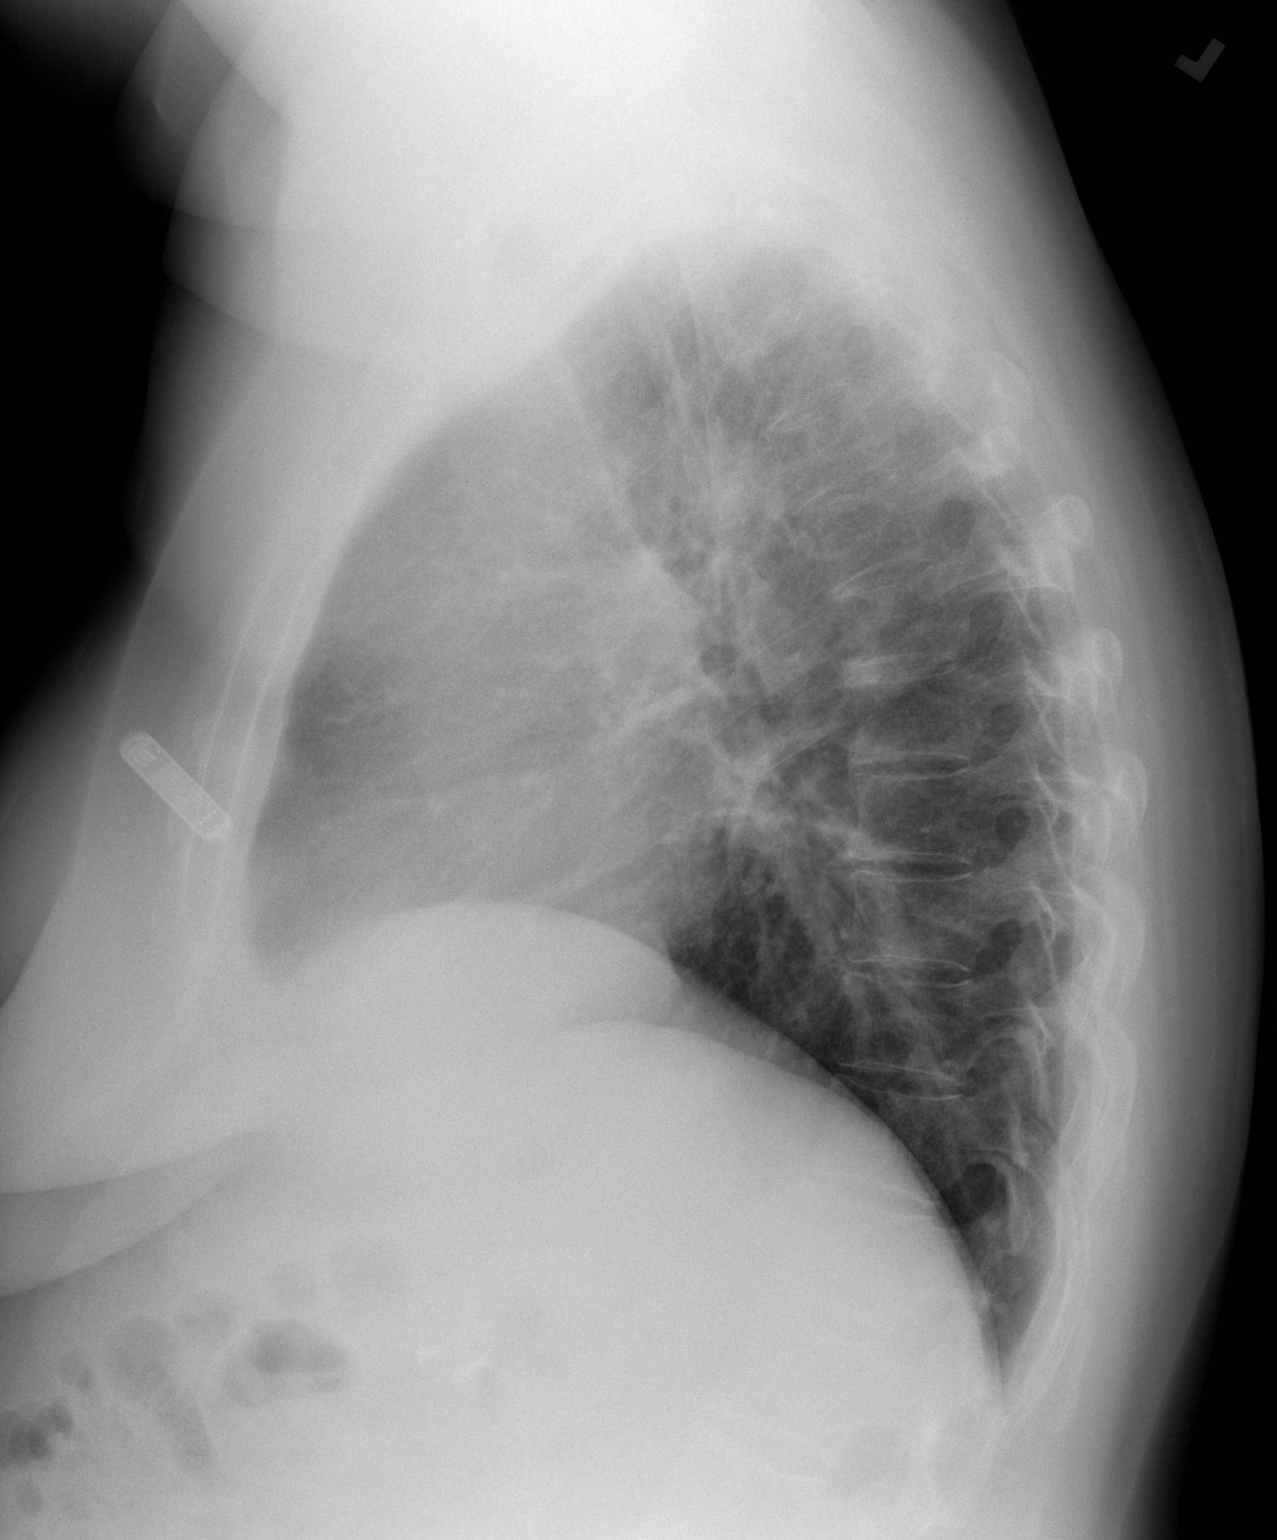

[2 of 2 positions shown; findings below may reference images not displayed]

FINDINGS: Loop recorder in the left anterior chest wall. No focal
consolidation. No pleural effusion or pneumothorax. Heart and
mediastinal contours are unremarkable.

No acute osseous abnormality.
IMPRESSION: No active cardiopulmonary disease.

## 2022-11-05 DIAGNOSIS — Z87891 Personal history of nicotine dependence: Secondary | ICD-10-CM | POA: Diagnosis not present

## 2022-11-05 DIAGNOSIS — R002 Palpitations: Secondary | ICD-10-CM | POA: Diagnosis not present

## 2022-11-05 DIAGNOSIS — R0600 Dyspnea, unspecified: Secondary | ICD-10-CM | POA: Diagnosis not present

## 2022-11-05 DIAGNOSIS — R0602 Shortness of breath: Secondary | ICD-10-CM | POA: Diagnosis not present

## 2022-11-05 DIAGNOSIS — R0609 Other forms of dyspnea: Secondary | ICD-10-CM | POA: Diagnosis not present

## 2022-11-05 DIAGNOSIS — I491 Atrial premature depolarization: Secondary | ICD-10-CM | POA: Diagnosis not present

## 2022-11-05 DIAGNOSIS — I493 Ventricular premature depolarization: Secondary | ICD-10-CM | POA: Diagnosis not present

## 2022-11-06 ENCOUNTER — Telehealth: Payer: Self-pay | Admitting: Family Medicine

## 2022-11-06 DIAGNOSIS — R0600 Dyspnea, unspecified: Secondary | ICD-10-CM | POA: Diagnosis not present

## 2022-11-06 DIAGNOSIS — E876 Hypokalemia: Secondary | ICD-10-CM

## 2022-11-06 DIAGNOSIS — E1169 Type 2 diabetes mellitus with other specified complication: Secondary | ICD-10-CM

## 2022-11-06 DIAGNOSIS — Z87891 Personal history of nicotine dependence: Secondary | ICD-10-CM | POA: Diagnosis not present

## 2022-11-06 DIAGNOSIS — R000 Tachycardia, unspecified: Secondary | ICD-10-CM | POA: Diagnosis not present

## 2022-11-06 DIAGNOSIS — R0609 Other forms of dyspnea: Secondary | ICD-10-CM | POA: Diagnosis not present

## 2022-11-06 DIAGNOSIS — R002 Palpitations: Secondary | ICD-10-CM | POA: Diagnosis not present

## 2022-11-06 DIAGNOSIS — R0602 Shortness of breath: Secondary | ICD-10-CM | POA: Diagnosis not present

## 2022-11-06 DIAGNOSIS — R030 Elevated blood-pressure reading, without diagnosis of hypertension: Secondary | ICD-10-CM | POA: Diagnosis not present

## 2022-11-06 DIAGNOSIS — Z658 Other specified problems related to psychosocial circumstances: Secondary | ICD-10-CM | POA: Diagnosis not present

## 2022-11-06 DIAGNOSIS — I491 Atrial premature depolarization: Secondary | ICD-10-CM | POA: Diagnosis not present

## 2022-11-06 MED ORDER — POTASSIUM CHLORIDE 20 MEQ PO PACK: 20.0000 meq | PACK | Freq: Every day | ORAL | 2 refills | Status: DC

## 2022-11-06 MED ORDER — ACCU-CHEK SOFTCLIX LANCETS MISC: 1.0000 | Freq: Two times a day (BID) | 11 refills | Status: DC

## 2022-11-06 MED ORDER — BLOOD GLUCOSE METER KIT: PACK | 0 refills | Status: DC

## 2022-11-06 MED ORDER — ACCU-CHEK GUIDE VI STRP: ORAL_STRIP | 11 refills | Status: DC

## 2022-11-06 NOTE — Telephone Encounter (Signed)
Pt's medication sent to preferred pharmacy [facility] [place]

## 2022-11-06 NOTE — Telephone Encounter (Signed)
Pt called requesting a new prescription of blood glucose meter kit and supplies [id number] due to recently switching pharmacies. Pt is also unable to get her prescription of potassium chloride (KLOR-CON) 20 MEQ packet [id number] due to the pharmacy waiting on something from the doctor.  Preferred pharmacy - [facility] in [facility]

## 2022-11-07 DIAGNOSIS — R000 Tachycardia, unspecified: Secondary | ICD-10-CM | POA: Diagnosis not present

## 2022-11-07 DIAGNOSIS — I491 Atrial premature depolarization: Secondary | ICD-10-CM | POA: Diagnosis not present

## 2022-11-08 ENCOUNTER — Telehealth (INDEPENDENT_AMBULATORY_CARE_PROVIDER_SITE_OTHER): Payer: Medicare HMO | Admitting: Family Medicine

## 2022-11-08 DIAGNOSIS — R051 Acute cough: Secondary | ICD-10-CM

## 2022-11-08 DIAGNOSIS — E1169 Type 2 diabetes mellitus with other specified complication: Secondary | ICD-10-CM

## 2022-11-08 DIAGNOSIS — E1165 Type 2 diabetes mellitus with hyperglycemia: Secondary | ICD-10-CM

## 2022-11-08 MED ORDER — EMPAGLIFLOZIN 10 MG PO TABS: 10.0000 mg | ORAL_TABLET | Freq: Every day | ORAL | 1 refills | Status: DC

## 2022-11-08 NOTE — Progress Notes (Signed)
Virtual Visit via Video Note  I connected with Jennifer [name] on 11/08/22 at 10:50 AM EDT by a video enabled telemedicine application and verified that I am speaking with the correct person using two identifiers.   I discussed the limitations of evaluation and management by telemedicine and the availability of in person appointments. The patient expressed understanding and agreed to proceed.  Patient location: at home Provider location: in office  Subjective:    CC:  No chief complaint on file.   HPI:  She wanted to discuss her diabetes today.  We had originally discussed maybe putting her on metformin but she was very worried about the size of the pill knowing that it was quite large and she does have swallowing issues.  We then settled on Januvia.  But in speaking with her sister she noted that having had a prior history of pancreatitis can be a contraindication and she does report a prior history of pancreatitis.  She was also not interested in any of the GLP-1's because of prior history of gastric issues.  She tends to get a lot of bloating early satiety etc.  Still scheduled for her ablation next month.  She is really quite nervous about it.   Today she really just does not feel well she has a lot of excess mucus, building up in her throat so she has had a pretty forceful cough.  No fevers or chills or anything else to suspect a viral illness.  But it just makes her feel like her throat is tight.  Pulse ox levels have been around 98% today.  Past medical history, Surgical history, Family history not pertinant except as noted below, Social history, Allergies, and medications have been entered into the medical record, reviewed, and corrections made.    Objective:    General: Speaking clearly in complete sentences without any shortness of breath.  Alert and oriented x3.  Normal judgment. No apparent acute distress.    Impression and Recommendations:    Problem List Items  Addressed This Visit       Endocrine   Uncontrolled type 2 diabetes mellitus with hyperglycemia ([place])    She is already started working on improving her diet eating more vegetables cutting back on sweets and carbs.  Encouraged her to stay as active as she is able to she is not currently able to exercise regularly because of her cardiac issues.  We discussed switching the medication to Jardiance.  Will start with a low-dose.  It is okay for her to wait until her cardiac procedure before starting the medication I do not want to add something that can increase stress to her day.  But I do want her to start it as soon as she feels comfortable.      Relevant Medications   empagliflozin (JARDIANCE) 10 MG TABS tablet   Other Visit Diagnoses     Type 2 diabetes mellitus with other specified complication, without long-term current use of insulin (HCC)    -  Primary   Relevant Medications   empagliflozin (JARDIANCE) 10 MG TABS tablet   Acute cough          Acute cough-it sounds like she is having a lot of mucus production as well.  It is making it more difficult for her to feel like she can breathe normally just gave her reassurance that her oxygen level is normal she was able to talk without difficulty.  Also encouraged her to consider using her  albuterol inhaler specially if she is coughing a lot to see if that helps as well she was worried about it making Her tachycardic but she does have Xopenex at home she could always to start with 1 puff and then repeat in 30 minutes if needing a second dose.  Continue to hydrate well to thin the secretions.  Okay to use Mucinex to help break them up and move them out.  Consider could be viral illness versus allergies.  No orders of the defined types were placed in this encounter.   Meds ordered this encounter  Medications   empagliflozin (JARDIANCE) 10 MG TABS tablet    Sig: Take 1 tablet (10 mg total) by mouth daily before breakfast.    Dispense:  30  tablet    Refill:  1    Discontinue any prior orders for Januvia on file.     I discussed the assessment and treatment plan with the patient. The patient was provided an opportunity to ask questions and all were answered. The patient agreed with the plan and demonstrated an understanding of the instructions.   The patient was advised to call back or seek an in-person evaluation if the symptoms worsen or if the condition fails to improve as anticipated.     Beatrice Lecher, MD

## 2022-11-08 NOTE — Assessment & Plan Note (Signed)
She is already started working on improving her diet eating more vegetables cutting back on sweets and carbs.  Encouraged her to stay as active as she is able to she is not currently able to exercise regularly because of her cardiac issues.  We discussed switching the medication to Jardiance.  Will start with a low-dose.  It is okay for her to wait until her cardiac procedure before starting the medication I do not want to add something that can increase stress to her day.  But I do want her to start it as soon as she feels comfortable.

## 2022-11-09 ENCOUNTER — Telehealth: Payer: Medicare HMO | Admitting: Family Medicine

## 2022-11-09 DIAGNOSIS — E531 Pyridoxine deficiency: Secondary | ICD-10-CM | POA: Diagnosis not present

## 2022-11-09 DIAGNOSIS — Z20822 Contact with and (suspected) exposure to covid-19: Secondary | ICD-10-CM | POA: Diagnosis not present

## 2022-11-09 DIAGNOSIS — R059 Cough, unspecified: Secondary | ICD-10-CM | POA: Diagnosis not present

## 2022-11-09 DIAGNOSIS — I471 Supraventricular tachycardia, unspecified: Secondary | ICD-10-CM | POA: Diagnosis not present

## 2022-11-09 DIAGNOSIS — R002 Palpitations: Secondary | ICD-10-CM | POA: Diagnosis not present

## 2022-11-09 DIAGNOSIS — E782 Mixed hyperlipidemia: Secondary | ICD-10-CM | POA: Diagnosis not present

## 2022-11-09 DIAGNOSIS — E611 Iron deficiency: Secondary | ICD-10-CM | POA: Diagnosis not present

## 2022-11-09 DIAGNOSIS — R051 Acute cough: Secondary | ICD-10-CM | POA: Diagnosis not present

## 2022-11-09 DIAGNOSIS — R000 Tachycardia, unspecified: Secondary | ICD-10-CM | POA: Diagnosis not present

## 2022-11-09 DIAGNOSIS — I10 Essential (primary) hypertension: Secondary | ICD-10-CM | POA: Diagnosis not present

## 2022-11-09 DIAGNOSIS — R0602 Shortness of breath: Secondary | ICD-10-CM | POA: Diagnosis not present

## 2022-11-09 DIAGNOSIS — Z87891 Personal history of nicotine dependence: Secondary | ICD-10-CM | POA: Diagnosis not present

## 2022-11-11 DIAGNOSIS — Z79899 Other long term (current) drug therapy: Secondary | ICD-10-CM | POA: Diagnosis not present

## 2022-11-11 DIAGNOSIS — R42 Dizziness and giddiness: Secondary | ICD-10-CM | POA: Diagnosis not present

## 2022-11-11 DIAGNOSIS — R079 Chest pain, unspecified: Secondary | ICD-10-CM | POA: Diagnosis not present

## 2022-11-11 DIAGNOSIS — R0789 Other chest pain: Secondary | ICD-10-CM | POA: Diagnosis not present

## 2022-11-11 DIAGNOSIS — I493 Ventricular premature depolarization: Secondary | ICD-10-CM | POA: Diagnosis not present

## 2022-11-13 ENCOUNTER — Telehealth: Payer: Self-pay | Admitting: Cardiovascular Disease

## 2022-11-13 DIAGNOSIS — E860 Dehydration: Secondary | ICD-10-CM | POA: Diagnosis not present

## 2022-11-13 DIAGNOSIS — R079 Chest pain, unspecified: Secondary | ICD-10-CM | POA: Diagnosis not present

## 2022-11-13 DIAGNOSIS — Z79899 Other long term (current) drug therapy: Secondary | ICD-10-CM | POA: Diagnosis not present

## 2022-11-13 DIAGNOSIS — I10 Essential (primary) hypertension: Secondary | ICD-10-CM | POA: Diagnosis not present

## 2022-11-13 DIAGNOSIS — R000 Tachycardia, unspecified: Secondary | ICD-10-CM | POA: Diagnosis not present

## 2022-11-13 DIAGNOSIS — R5383 Other fatigue: Secondary | ICD-10-CM | POA: Diagnosis not present

## 2022-11-13 DIAGNOSIS — R630 Anorexia: Secondary | ICD-10-CM | POA: Diagnosis not present

## 2022-11-13 NOTE — Telephone Encounter (Signed)
Pt states she has been waiting on a PA for her Potassium medication .

## 2022-11-14 ENCOUNTER — Telehealth: Payer: Self-pay

## 2022-11-14 DIAGNOSIS — C519 Malignant neoplasm of vulva, unspecified: Secondary | ICD-10-CM

## 2022-11-14 DIAGNOSIS — R0602 Shortness of breath: Secondary | ICD-10-CM | POA: Diagnosis not present

## 2022-11-14 DIAGNOSIS — I491 Atrial premature depolarization: Secondary | ICD-10-CM | POA: Diagnosis not present

## 2022-11-14 DIAGNOSIS — K5900 Constipation, unspecified: Secondary | ICD-10-CM | POA: Diagnosis not present

## 2022-11-14 DIAGNOSIS — R079 Chest pain, unspecified: Secondary | ICD-10-CM | POA: Diagnosis not present

## 2022-11-14 DIAGNOSIS — I10 Essential (primary) hypertension: Secondary | ICD-10-CM | POA: Diagnosis not present

## 2022-11-14 DIAGNOSIS — R002 Palpitations: Secondary | ICD-10-CM | POA: Diagnosis not present

## 2022-11-14 SURGERY — EXAM UNDER ANESTHESIA
Anesthesia: General

## 2022-11-14 NOTE — Telephone Encounter (Signed)
Initiated Prior authorization [id number] 20MEQ packets Via: Covermymeds Case/Key:[id number] Status: Pending as of 11/14/22 Reason: Notified Pt via: Mychart

## 2022-11-15 ENCOUNTER — Other Ambulatory Visit: Payer: Self-pay

## 2022-11-15 ENCOUNTER — Encounter: Payer: Self-pay | Admitting: Internal Medicine

## 2022-11-15 ENCOUNTER — Ambulatory Visit (INDEPENDENT_AMBULATORY_CARE_PROVIDER_SITE_OTHER): Payer: Medicare HMO | Admitting: Internal Medicine

## 2022-11-15 ENCOUNTER — Telehealth: Payer: Self-pay | Admitting: Family Medicine

## 2022-11-15 ENCOUNTER — Other Ambulatory Visit: Payer: Self-pay | Admitting: Pharmacist

## 2022-11-15 VITALS — BP 118/76 | HR 75 | Temp 97.9°F | Resp 18 | Ht 62.0 in

## 2022-11-15 DIAGNOSIS — R053 Chronic cough: Secondary | ICD-10-CM | POA: Diagnosis not present

## 2022-11-15 DIAGNOSIS — I10 Essential (primary) hypertension: Secondary | ICD-10-CM | POA: Diagnosis not present

## 2022-11-15 DIAGNOSIS — R079 Chest pain, unspecified: Secondary | ICD-10-CM | POA: Diagnosis not present

## 2022-11-15 DIAGNOSIS — Z87891 Personal history of nicotine dependence: Secondary | ICD-10-CM | POA: Diagnosis not present

## 2022-11-15 DIAGNOSIS — I491 Atrial premature depolarization: Secondary | ICD-10-CM | POA: Diagnosis not present

## 2022-11-15 DIAGNOSIS — E1169 Type 2 diabetes mellitus with other specified complication: Secondary | ICD-10-CM

## 2022-11-15 DIAGNOSIS — R0609 Other forms of dyspnea: Secondary | ICD-10-CM | POA: Diagnosis not present

## 2022-11-15 DIAGNOSIS — R000 Tachycardia, unspecified: Secondary | ICD-10-CM | POA: Diagnosis not present

## 2022-11-15 DIAGNOSIS — J310 Chronic rhinitis: Secondary | ICD-10-CM

## 2022-11-15 DIAGNOSIS — R002 Palpitations: Secondary | ICD-10-CM | POA: Diagnosis not present

## 2022-11-15 DIAGNOSIS — Z556 Problems related to health literacy: Secondary | ICD-10-CM | POA: Diagnosis not present

## 2022-11-15 DIAGNOSIS — Z90710 Acquired absence of both cervix and uterus: Secondary | ICD-10-CM | POA: Diagnosis not present

## 2022-11-15 NOTE — Telephone Encounter (Signed)
Jennifer [name] is requesting to talk to our pharmacist to go over her Diabetes medication with her.
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
## 2022-11-15 NOTE — Patient Instructions (Addendum)
Return in about 6 months (around 05/17/2023).  [name]   Chronic Cough, Shortness of breath Daily controller medication(s): none; does not tolerate any inhaled steroids  May use levoalbuterol rescue inhaler 2 puffs or nebulizer every 4 to 6 hours as needed for shortness of breath, chest tightness, coughing, and wheezing. Monitor frequency of use.  Breathing control goals:  Full participation in all desired activities (may need albuterol before activity) Albuterol use two times or less a week on average (not counting use with activity) Cough interfering with sleep two times or less a month Oral steroids no more than once a year No hospitalizations   Reflux Continue dietary lifestyle modifications. Continue the regimen as prescribed by your gastroenterologist.  Keep a diary to track symptoms and food exposures  Chronic rhinitis Continue flunisolide nasal spray 2 sprays in each nostril once a day a day as needed for stuffy nose.  In the right nostril, point the applicator out toward the right ear. In the left nostril, point the applicator out toward the left ear Continue to perform nasal saline rinses 1-3 times a day as needed to help clean sinus tract - neil med sinus rinses DO NOT USE TAP WATER, can use distilled water or tap water that you boil for several minutes and then let cool prior to use.  You can also try NeilMed Sinus Rinse Premixed Refill Packets With Xylitol and Sinus Rinse Extra Strength Hypertonic Packets. Can use salt water gargles to help with phlegm in throat.   Muscle Tension Dysphonia and suspected vocal cord spasm  - these symptoms can worsen if drainage and reflux are uncontrolled - important to keep these symptoms at [place] - continue to follow up with ENT and the swallowing evaluation.   High blood pressure and palpitations  Monitor symptoms and follow up with PCP and cardiology

## 2022-11-15 NOTE — Telephone Encounter (Signed)
Orders Placed This Encounter  Procedures   AMB Referral to Pharmacy Medication Management    Referral Priority:   Routine    Referral Type:   Consultation    Referral Reason:   Pharmacy Medication Management    Number of Visits Requested:   1    

## 2022-11-15 NOTE — Progress Notes (Signed)
11/15/2022 Name: Jennifer [name] MRN: [id number] DOB: Jan 04, 1966   Jennifer [name] is a 57 y.o. year old female who presented for a telephone visit.   They were referred to the pharmacist by  incoming call to PCP office  for assistance in managing diabetes and medication questions .   Subjective:  Care Team: Primary Care Provider: [name], MD ; Next Scheduled Visit: 11/16/22  Medication Access/Adherence  Current Pharmacy:  [facility] [phone] - [place], [address] [place] [address] Phone: [phone] Fax: [phone]    Diabetes:  Current medications: jardiance 10mg  daily, has not started yet. Jardiance would be initial diabetes therapy, not ideal GLP1 agonist candidate if hx pancreatitis, and patient declines metformin due to pill size  Medications tried in the past:  - metformin: did not start due to large pill size  - januvia: did not start due to pancreatitis concerns - rybelsus: did not start due to pancreatitis concerns    Objective:  Lab Results  Component Value Date   HGBA1C 7.6 (A) 09/08/2022    Lab Results  Component Value Date   CREATININE 0.89 11/03/2022   BUN 18 11/03/2022   NA 137 11/03/2022   K 3.8 11/03/2022   CL 103 11/03/2022   CO2 25 11/03/2022    Lab Results  Component Value Date   CHOL 170 01/24/2018   HDL 38 01/24/2018   LDLCALC 92 01/24/2018   LDLDIRECT 110 (H) 08/19/2012   TRIG 165 (H) 07/30/2020   CHOLHDL 4.6 11/20/2013    Medications Reviewed Today     Reviewed by [name], MD (Physician) on 11/08/22 at 58  Med List Status: <None>   Medication Order Taking? Sig Documenting Provider Last Dose Status Informant  ACCU-CHEK GUIDE test strip [id number]  For testing blood sugars 2 times daily. E11.65 [name], MD  Active   Accu-Chek Softclix Lancets lancets [id number]  1 each by Other route 2 (two) times daily. [name], MD  Active   Camillo Flaming MEDICATION [id number] No Medication Name: standing order BMP x 1 year [name], MD Taking Active   blood glucose meter kit and supplies [id number]  Dispense based on patient and insurance preference. Use to check blood sugars twice daily E11.9 [name], MD  Active   empagliflozin (JARDIANCE) 10 MG TABS tablet [id number] Yes Take 1 tablet (10 mg total) by mouth daily before breakfast. [name], MD  Active   flunisolide (NASALIDE) 25 MCG/ACT (0.025%) SOLN [id number]  Place 1 spray into the nose daily as needed. Larose Kells, MD  Active   hydrALAZINE (APRESOLINE) 10 MG tablet [id number] No Take 1 tablet (10 mg total) by mouth 3 (three) times daily. [name], MD Taking Active   levalbuterol [facility] HFA) 45 MCG/ACT inhaler [id number]  Inhale 1 puff into the lungs every 4 (four) hours as needed for wheezing or shortness of breath. Larose Kells, MD  Active   metoprolol tartrate (LOPRESSOR) 50 MG tablet [id number] No TAKE ONE TABLET BY MOUTH TWICE A DAY [name], MD Taking Active   potassium chloride (KLOR-CON) 20 MEQ packet [id number]  Take 20 mEq by mouth daily. [name], MD  Active   sitaGLIPtin (JANUVIA) 25 MG tablet [id number] No Take 1 tablet (25 mg total) by mouth daily. [name], MD Taking Active   Med  List Note Madilyn Fireman, Rene Kocher, MD 08/29/21 1622):                 Assessment/Plan:   Diabetes: - Currently uncontrolled, counseled patient on risk/benefit of various medications and classes of diabetes medications - Reviewed long term cardiovascular and renal outcomes of uncontrolled blood sugar - Reviewed goal A1c, goal fasting, and goal 2 hour post prandial glucose - Recommend to initiate jardiance 10mg  daily.  Counseled patient on new medication, answered questions   Follow Up Plan: 1-2 months or as patient desires  Larinda Buttery, PharmD,  [facility]

## 2022-11-16 ENCOUNTER — Telehealth (INDEPENDENT_AMBULATORY_CARE_PROVIDER_SITE_OTHER): Payer: Medicare HMO | Admitting: Family Medicine

## 2022-11-16 DIAGNOSIS — H538 Other visual disturbances: Secondary | ICD-10-CM

## 2022-11-16 DIAGNOSIS — S060X0A Concussion without loss of consciousness, initial encounter: Secondary | ICD-10-CM

## 2022-11-16 DIAGNOSIS — S0990XA Unspecified injury of head, initial encounter: Secondary | ICD-10-CM

## 2022-11-16 DIAGNOSIS — G44309 Post-traumatic headache, unspecified, not intractable: Secondary | ICD-10-CM | POA: Diagnosis not present

## 2022-11-16 DIAGNOSIS — E1165 Type 2 diabetes mellitus with hyperglycemia: Secondary | ICD-10-CM | POA: Diagnosis not present

## 2022-11-16 DIAGNOSIS — I491 Atrial premature depolarization: Secondary | ICD-10-CM | POA: Diagnosis not present

## 2022-11-16 NOTE — Progress Notes (Signed)
Virtual Visit via Video Note  I connected with Jennifer [name] on 11/16/22 at  1:00 PM EDT by a video enabled telemedicine application and verified that I am speaking with the correct person using two identifiers.   I discussed the limitations of evaluation and management by telemedicine and the availability of in person appointments. The patient expressed understanding and agreed to proceed.  Patient location: at home Provider location: in office  Subjective:    CC:  No chief complaint on file.   HPI: She unfortunately hit her head last night on door hard to wear.  She said she flopped backwards and her head hit it.  She does have a bump on the back of her scalp and she did ice it.  But today she has noticed that she has had a headache and some nausea.  No vomiting.  She feels like her vision has been a little blurry as well.  She has been laying down a good part of the day.  She is still having episodes of tachycardia palpitations and heavy pounding in her chest.  She did follow back up with a cardiologist and they want to do another 2-week monitor before they schedule her for an ablation.  She just really exhausted with not feeling well and is really struggling emotionally in that regard.  She has connected with a therapist and has worked with them a couple of times now in fact she had an appointment earlier today.  Did also primarily scheduled this visit to go over medication in regards to diabetes.  She had a helpful meeting virtually with our clinical pharmacist yesterday. They did discuss the Jardiance and pros and cons.  Did encourage her to stay well-hydrated on the medication.  She is open to trying it but wants to wait until after she has her additional cardiac procedure and workup.  Past medical history, Surgical history, Family history not pertinant except as noted below, Social history, Allergies, and medications have been entered into the medical record, reviewed, and  corrections made.    Objective:    General: Speaking clearly in complete sentences without any shortness of breath.  Alert and oriented x3.  Normal judgment. No apparent acute distress.    Impression and Recommendations:    Problem List Items Addressed This Visit       Endocrine   Uncontrolled type 2 diabetes mellitus with hyperglycemia    PLan to start Jardiance hopefully in a couple of weeks.  Will let me know if she has any problems or concerns.  If she does become ill and cannot stay well-hydrated then okay to skip the tab that day or for couple days if needed.  You to work on healthy food choices to improve glucose levels especially since she is not currently on medication.        Other   Headache   Other Visit Diagnoses     Injury of head, initial encounter    -  Primary   Blurry vision       Concussion without loss of consciousness, initial encounter           No orders of the defined types were placed in this encounter.   No orders of the defined types were placed in this encounter.  Concussion-it sounds like she likely has a concussion after her head injury last night.  Right now continue to rest minimize screen time.  If she feels like she is getting worse or develops  vomiting please seek care.  I discussed the assessment and treatment plan with the patient. The patient was provided an opportunity to ask questions and all were answered. The patient agreed with the plan and demonstrated an understanding of the instructions.   The patient was advised to call back or seek an in-person evaluation if the symptoms worsen or if the condition fails to improve as anticipated.  I spent 32 minutes on the day of the encounter to include pre-visit record review, face-to-face time with the patient and post visit ordering of test.   Beatrice Lecher, MD

## 2022-11-16 NOTE — Assessment & Plan Note (Addendum)
PLan to start Jardiance hopefully in a couple of weeks.  Will let me know if she has any problems or concerns.  If she does become ill and cannot stay well-hydrated then okay to skip the tab that day or for couple days if needed.  You to work on healthy food choices to improve glucose levels especially since she is not currently on medication.

## 2022-11-17 ENCOUNTER — Emergency Department (HOSPITAL_BASED_OUTPATIENT_CLINIC_OR_DEPARTMENT_OTHER): Payer: Medicare HMO

## 2022-11-17 ENCOUNTER — Other Ambulatory Visit: Payer: Self-pay

## 2022-11-17 ENCOUNTER — Emergency Department (HOSPITAL_BASED_OUTPATIENT_CLINIC_OR_DEPARTMENT_OTHER)
Admission: EM | Admit: 2022-11-17 | Discharge: 2022-11-17 | Disposition: A | Discharge status: Home / Self Care | Payer: Medicare HMO | Attending: Emergency Medicine | Admitting: Emergency Medicine

## 2022-11-17 ENCOUNTER — Encounter (HOSPITAL_BASED_OUTPATIENT_CLINIC_OR_DEPARTMENT_OTHER): Payer: Self-pay

## 2022-11-17 DIAGNOSIS — S0990XA Unspecified injury of head, initial encounter: Secondary | ICD-10-CM | POA: Insufficient documentation

## 2022-11-17 DIAGNOSIS — W228XXA Striking against or struck by other objects, initial encounter: Secondary | ICD-10-CM | POA: Insufficient documentation

## 2022-11-17 DIAGNOSIS — R519 Headache, unspecified: Secondary | ICD-10-CM | POA: Diagnosis not present

## 2022-11-17 DIAGNOSIS — R0602 Shortness of breath: Secondary | ICD-10-CM | POA: Diagnosis not present

## 2022-11-17 DIAGNOSIS — M542 Cervicalgia: Secondary | ICD-10-CM | POA: Diagnosis not present

## 2022-11-17 DIAGNOSIS — R002 Palpitations: Secondary | ICD-10-CM | POA: Diagnosis not present

## 2022-11-17 DIAGNOSIS — I493 Ventricular premature depolarization: Secondary | ICD-10-CM | POA: Diagnosis not present

## 2022-11-17 DIAGNOSIS — J989 Respiratory disorder, unspecified: Secondary | ICD-10-CM | POA: Diagnosis not present

## 2022-11-17 DIAGNOSIS — R0600 Dyspnea, unspecified: Secondary | ICD-10-CM | POA: Diagnosis not present

## 2022-11-17 NOTE — Discharge Instructions (Signed)
Follow-up with your family doctor in the office.  If you are having ongoing symptoms then there are concussion clinics in the community that they may send you to if they need to.  Please return to the Emergency Department for worsening symptoms persistent vomiting confusion. Take 4 over the counter ibuprofen tablets 3 times a day or 2 over-the-counter naproxen tablets twice a day for pain. Also take tylenol 1000mg (2 extra strength) four times a day.

## 2022-11-17 NOTE — ED Triage Notes (Signed)
Reports head injury x 2 days ago after sitting in a chair too hard and striking back of head on a metal piece.   Seen PCP virtually and was suggested she had a concussion.   Says she noticed a swollen area, blurred vision and one episode of vomiting.

## 2022-11-17 NOTE — ED Provider Notes (Signed)
[facility] [place] Provider Note   CSN: [id number] Arrival date & time: 11/17/22  0321     History  Chief Complaint  Patient presents with   Head Injury    Jennifer SpiresRebecca J [name] is a 57 y.o. female.  57 yo F with a chief complaint of a head injury.  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  This happened a couple days ago.  Since then she has been having some nausea had an episode of vomiting and has had a headache and feels like her vision is blurry.  She saw her PCP yesterday virtually who encouraged her to continue take Tylenol and ibuprofen or naproxen.  Patient felt like she was having some swelling to her neck and her symptoms were getting better and so decided to come to the ED for evaluation.   Head Injury      Home Medications Prior to Admission medications   Medication Sig Start Date End Date Taking? Authorizing Provider  ACCU-CHEK GUIDE test strip For testing blood sugars 2 times daily. E11.65 11/06/22   Agapito GamesMetheney, Catherine D, MD  Accu-Chek Softclix Lancets lancets 1 each by Other route 2 (two) times daily. 11/06/22   Agapito GamesMetheney, Catherine D, MD  AMBULATORY NON FORMULARY MEDICATION Medication Name: standing order BMP x 1 year 09/08/22   Agapito GamesMetheney, Catherine D, MD  blood glucose meter kit and supplies Dispense based on patient and insurance preference. Use to check blood sugars twice daily E11.9 11/06/22   Agapito GamesMetheney, Catherine D, MD  empagliflozin (JARDIANCE) 10 MG TABS tablet Take 1 tablet (10 mg total) by mouth daily before breakfast. 11/08/22   Agapito GamesMetheney, Catherine D, MD  flunisolide (NASALIDE) 25 MCG/ACT (0.025%) SOLN Place 1 spray into the nose daily as needed. 10/18/22   Birder RobsonPatel, Priya P, MD  hydrALAZINE (APRESOLINE) 10 MG tablet Take 1 tablet (10 mg total) by mouth 3 (three) times daily. 10/06/22   Agapito GamesMetheney, Catherine D, MD  levalbuterol [facility](XOPENEX HFA) 45 MCG/ACT inhaler Inhale 1 puff into the lungs every 4 (four) hours as  needed for wheezing or shortness of breath. 10/18/22   Birder RobsonPatel, Priya P, MD  metoprolol tartrate (LOPRESSOR) 50 MG tablet TAKE ONE TABLET BY MOUTH TWICE A DAY 08/28/22   Agapito GamesMetheney, Catherine D, MD  potassium chloride (KLOR-CON) 20 MEQ packet Take 20 mEq by mouth daily. 11/06/22   Agapito GamesMetheney, Catherine D, MD  sitaGLIPtin (JANUVIA) 25 MG tablet Take 1 tablet (25 mg total) by mouth daily. Patient not taking: Reported on 11/15/2022 10/05/22   Agapito GamesMetheney, Catherine D, MD      Allergies    Azithromycin, Ciprofloxacin, Codeine, Erythromycin base, Sulfa antibiotics, Sulfasalazine, Telmisartan, Ace inhibitors, Aspirin, Atenolol, Avelox [moxifloxacin hcl in nacl], Beta adrenergic blockers, Buspar [buspirone], Butorphanol tartrate, Cetirizine, Clonidine hcl, Cortisone, Erythromycin, Fentanyl, Fluoxetine hcl, Ketorolac tromethamine, Lidocaine, Lisinopril, Metoclopramide hcl, Midazolam, Montelukast, Montelukast sodium, Naproxen, Paroxetine, Penicillins, Pravastatin, Promethazine, Promethazine hcl, Quinolones, Serotonin reuptake inhibitors (ssris), Sertraline hcl, Stelazine [trifluoperazine], Tobramycin, Trifluoperazine hcl, Atrovent nasal spray [ipratropium], Diltiazem, Iodinated contrast media, Metoprolol, Polyethylene glycol 3350, Propoxyphene, Adhesive [tape], Butorphanol, Ceftriaxone, Iron, Metoclopramide, Metronidazole, Other, Prednisone, Prochlorperazine, Venlafaxine, and Zyrtec [cetirizine hcl]    Review of Systems   Review of Systems  Physical Exam Updated Vital Signs BP (!) 158/101   Pulse 79   Temp 98 F (36.7 C)   Resp 18   Ht 5\' 2"  (1.575 m)   Wt 108.9 kg   LMP 06/25/2013   SpO2 97%   BMI  43.90 kg/m  Physical Exam Vitals and nursing note reviewed.  Constitutional:      General: She is not in acute distress.    Appearance: She is well-developed. She is not diaphoretic.  HENT:     Head: Normocephalic and atraumatic.     Comments: I do not appreciate any obvious areas of trauma.  The patient points to  the midline of her C-spine as area of increased swelling though I do not appreciate any on exam.  No obvious step-offs or deformities.  She is able to range her head 45 degrees in either direction without obvious discomfort. Eyes:     Pupils: Pupils are equal, round, and reactive to light.  Cardiovascular:     Rate and Rhythm: Normal rate and regular rhythm.     Heart sounds: No murmur heard.    No friction rub. No gallop.  Pulmonary:     Effort: Pulmonary effort is normal.     Breath sounds: No wheezing or rales.  Abdominal:     General: There is no distension.     Palpations: Abdomen is soft.     Tenderness: There is no abdominal tenderness.  Musculoskeletal:        General: No tenderness.     Cervical back: Normal range of motion and neck supple.  Skin:    General: Skin is warm and dry.  Neurological:     Mental Status: She is alert and oriented to person, place, and time.  Psychiatric:        Behavior: Behavior normal.     ED Results / Procedures / Treatments   Labs (all labs ordered are listed, but only abnormal results are displayed) Labs Reviewed - No data to display  EKG None  Radiology CT Cervical Spine Wo Contrast  Result Date: 11/17/2022 CLINICAL DATA:  57 year old female status post blunt trauma 2 days ago. Continued headache, pain. EXAM: CT CERVICAL SPINE WITHOUT CONTRAST TECHNIQUE: Multidetector CT imaging of the cervical spine was performed without intravenous contrast. Multiplanar CT image reconstructions were also generated. RADIATION DOSE REDUCTION: This exam was performed according to the departmental dose-optimization program which includes automated exposure control, adjustment of the mA and/or kV according to patient size and/or use of iterative reconstruction technique. COMPARISON:  Head CT today. Cervical spine CT 08/26/2022. [facility] cervical spine MRI 09/06/2022. FINDINGS: Alignment: Maintained cervical  lordosis. Cervicothoracic junction alignment is within normal limits. Bilateral posterior element alignment is within normal limits. Skull base and vertebrae: Visualized skull base is intact. No atlanto-occipital dissociation. C1 and C2 appear intact and aligned. No acute osseous abnormality identified. Soft tissues and spinal canal: No prevertebral fluid or swelling. No visible canal hematoma. Negative for age noncontrast visible neck soft tissues. Disc levels: Mild for age cervical spine degeneration demonstrated by MRI in January, appears stable today. Upper chest: Negative. IMPRESSION: 1. No acute traumatic injury identified in the cervical spine. 2. Mild for age cervical spine degeneration appears stable from January MRI. Electronically Signed   By: Odessa Fleming M.D.   On: 11/17/2022 05:03   CT Head Wo Contrast  Result Date: 11/17/2022 CLINICAL DATA:  57 year old female status post blunt trauma 2 days ago. Continued headache, pain. EXAM: CT HEAD WITHOUT CONTRAST TECHNIQUE: Contiguous axial images were obtained from the base of the skull through the vertex without intravenous contrast. RADIATION DOSE REDUCTION: This exam was performed according to the departmental dose-optimization program which includes automated exposure control, adjustment of the mA  and/or kV according to patient size and/or use of iterative reconstruction technique. COMPARISON:  Brain MRI 09/12/2022 and earlier.  Head CT 09/10/2022. FINDINGS: Brain: By report of 2021 MRI without and with contrast, a small 7 mm area of dural calcification along the posterior falx (series 2, image 27) with suspected to be a small meningioma. This appears unchanged and inconsequential. Normal cerebral volume. No midline shift, ventriculomegaly, mass effect, intracranial hemorrhage or evidence of cortically based acute infarction. Mild patchy white matter disease seems stable from the MRI in January and not significantly changed from last year. This is in a  nonspecific configuration, prior exams state history of multiple sclerosis. No cortical encephalomalacia. Vascular: Mild Calcified atherosclerosis at the skull base. No suspicious intracranial vascular hyperdensity. Skull: Stable and intact.  No acute osseous abnormality identified. Sinuses/Orbits: Visualized paranasal sinuses and mastoids are stable and well aerated. Other: Visualized orbits and scalp soft tissues are within normal limits. IMPRESSION: 1. No acute intracranial abnormality or acute traumatic injury identified. 2. Stable nonspecific white matter changes, history of multiple sclerosis by report. 3. A small 7 mm posterior dural calcification of the falx by MRI was suspected to be a small meningioma in 2021. This appears stable, inconsequential. Electronically Signed   By: Odessa Fleming M.D.   On: 11/17/2022 05:00    Procedures Procedures    Medications Ordered in ED Medications - No data to display  ED Course/ Medical Decision Making/ A&P                             Medical Decision Making  57 yo F well-known to this emergency department with 9 visits in the past 6 months and a care plan comes in with a chief complaints of a closed head injury.  She was seen virtually by her PCP yesterday.  Thought to have a concussion.  Patient came in because her symptoms have not resolved.  I discussed with her the normal timeline for a concussion.  I discussed her CT imaging with her.  I encouraged her to follow-up as an outpatient.  8:11 AM:  I have discussed the diagnosis/risks/treatment options with the patient.  Evaluation and diagnostic testing in the emergency department does not suggest an emergent condition requiring admission or immediate intervention beyond what has been performed at this time.  They will follow up with PCP. We also discussed returning to the ED immediately if new or worsening sx occur. We discussed the sx which are most concerning (e.g., sudden worsening pain, fever, inability  to tolerate by mouth) that necessitate immediate return. Medications administered to the patient during their visit and any new prescriptions provided to the patient are listed below.  Medications given during this visit Medications - No data to display   The patient appears reasonably screen and/or stabilized for discharge and I doubt any other medical condition or other [facility] requiring further screening, evaluation, or treatment in the ED at this time prior to discharge.          Final Clinical Impression(s) / ED Diagnoses Final diagnoses:  Minor head injury, initial encounter    Rx / DC Orders ED Discharge Orders     None         Melene Plan, DO 11/17/22 [phone]

## 2022-11-17 NOTE — ED Notes (Signed)
Pt left prior to receiving discharge paperwork. Unable to discuss instructions with patient or obtain temperature prior to departure.

## 2022-11-19 DIAGNOSIS — I493 Ventricular premature depolarization: Secondary | ICD-10-CM | POA: Diagnosis not present

## 2022-11-19 DIAGNOSIS — I491 Atrial premature depolarization: Secondary | ICD-10-CM | POA: Diagnosis not present

## 2022-11-19 IMAGING — US US EXTREM LOW VENOUS*L*
1 series · 14 of 24 positions shown · non-contrast
Comparison: 11/14/2020

CLINICAL DATA: Pain left leg

EXAM:
Left LOWER EXTREMITY VENOUS DOPPLER ULTRASOUND
TECHNIQUE: Gray-scale sonography with compression, as well as color and duplex
ultrasound, were performed to evaluate the deep venous system(s)
from the level of the common femoral vein through the popliteal and
proximal calf veins.

[Series 1: us extrem low venous*left* · 14 of 42 slices shown]
[im 1/42]
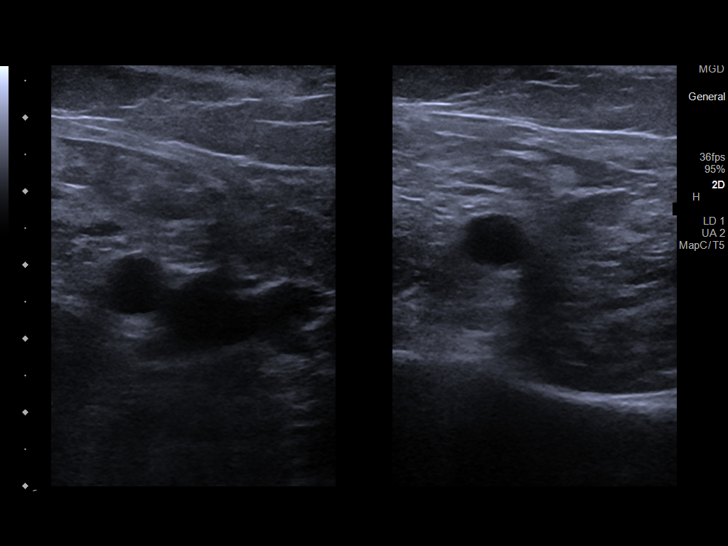
[im 4/42]
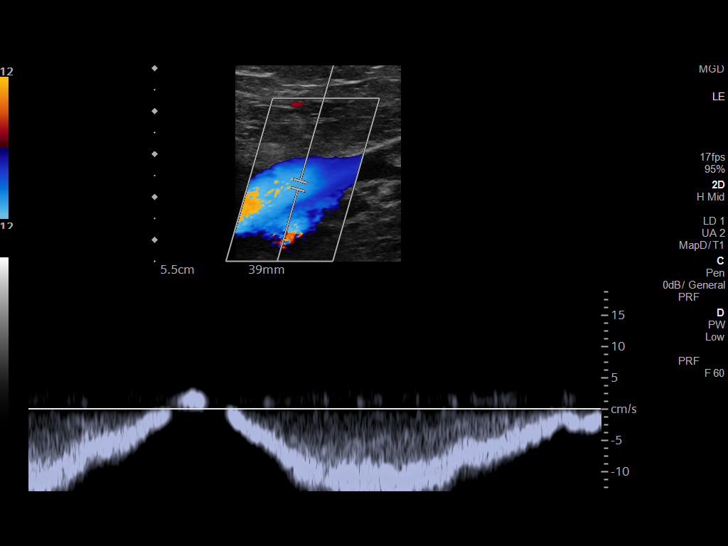
[im 8/42]
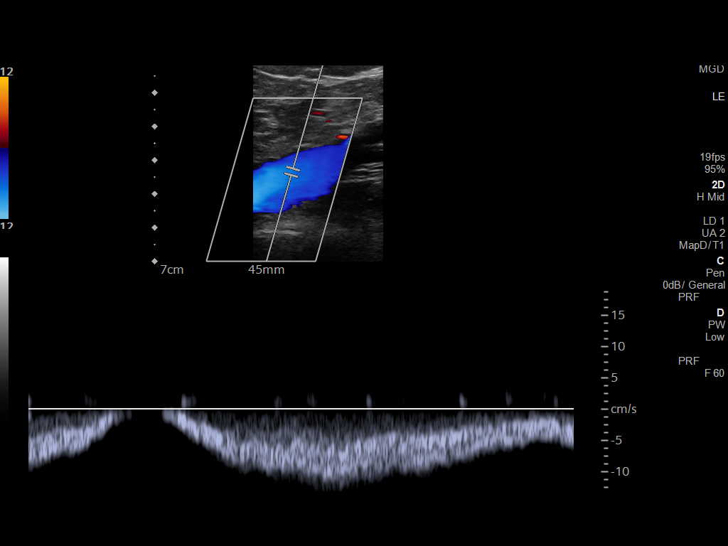
[im 11/42]
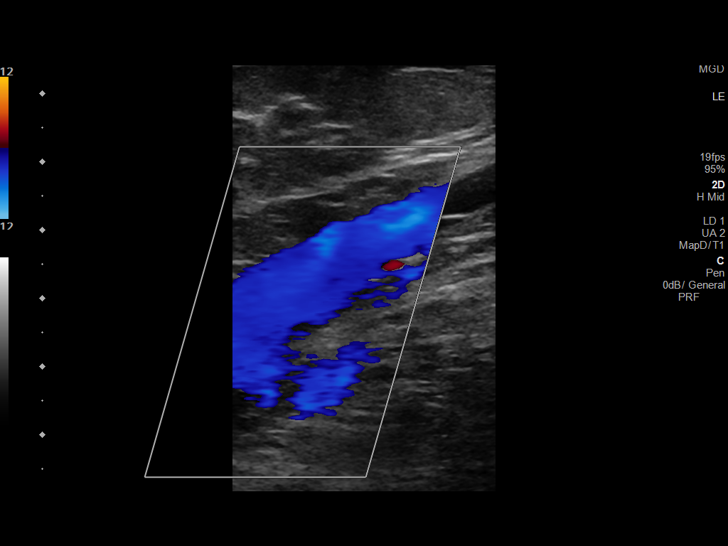
[im 13/42]
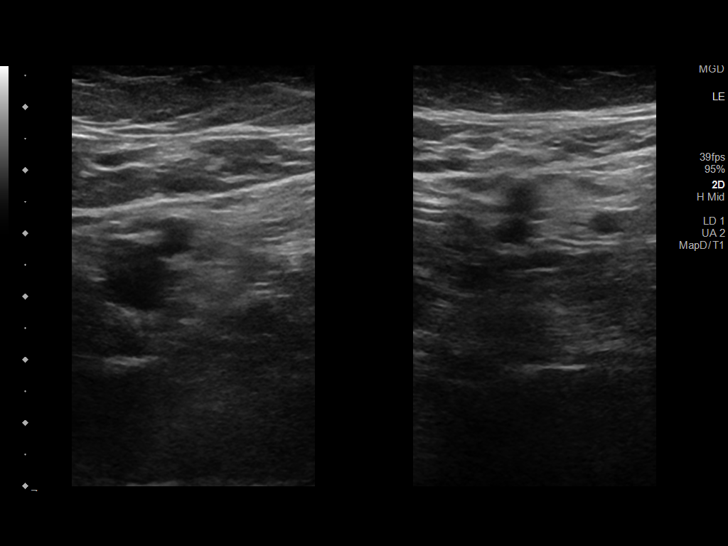
[im 17/42]
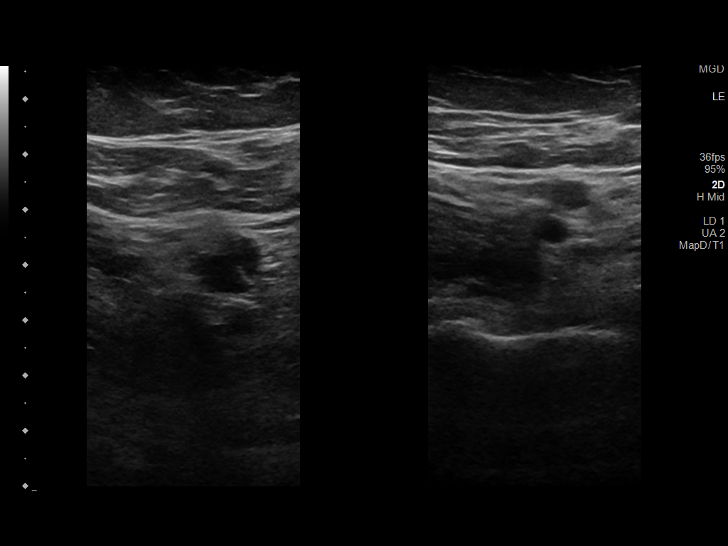
[im 20/42]
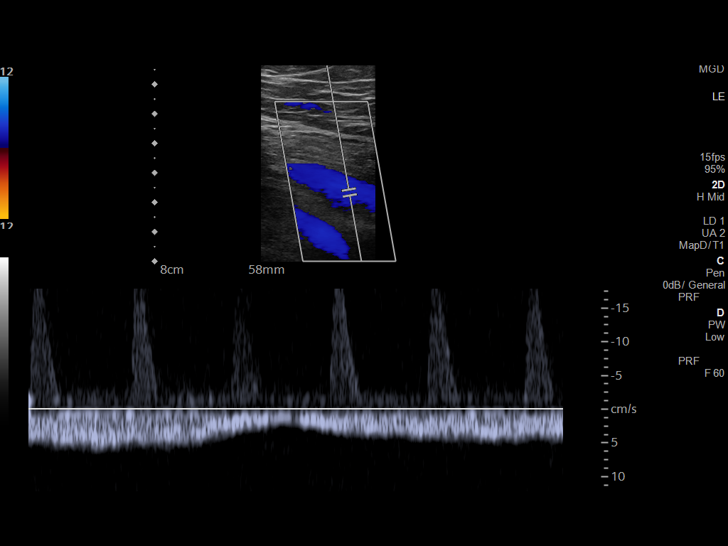
[im 22/42]
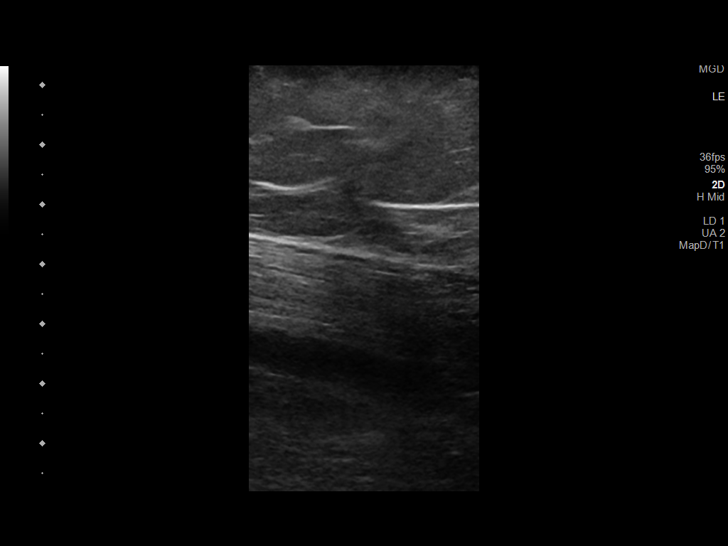
[im 25/42]
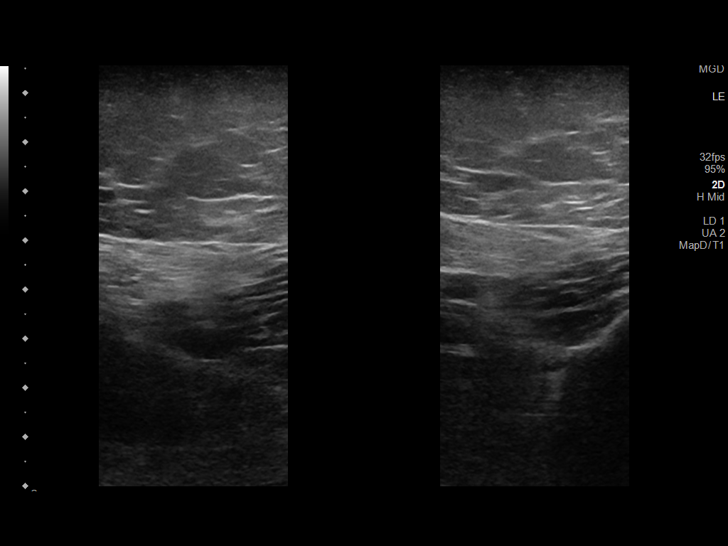
[im 29/42]
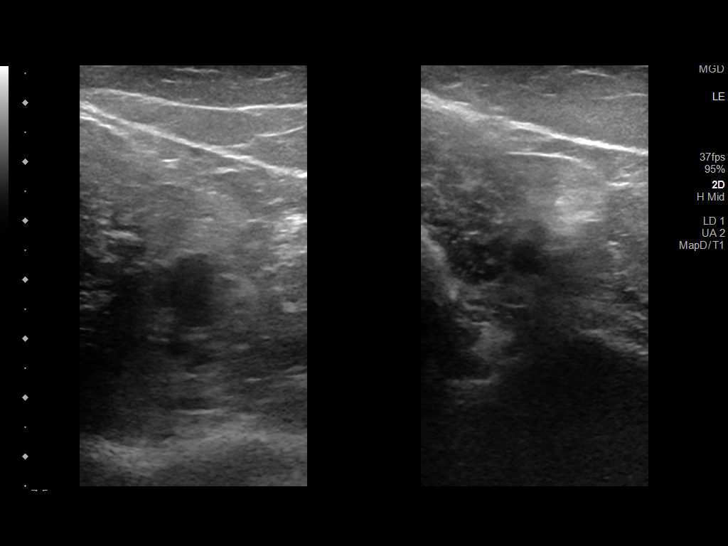
[im 33/42]
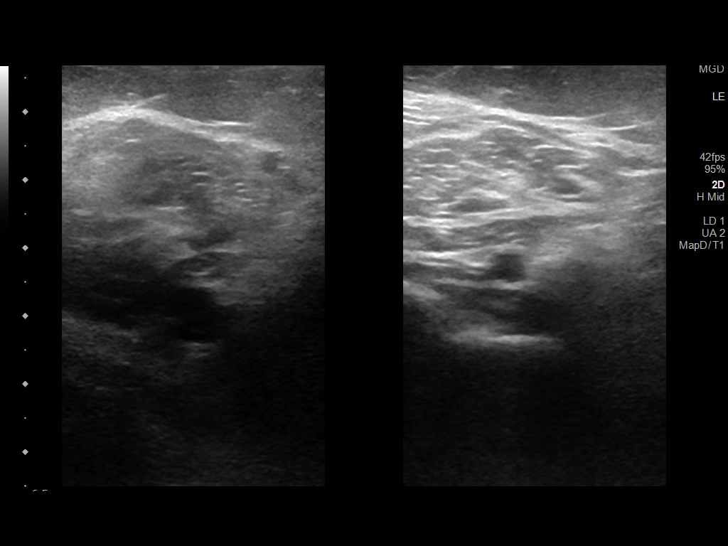
[im 34/42]
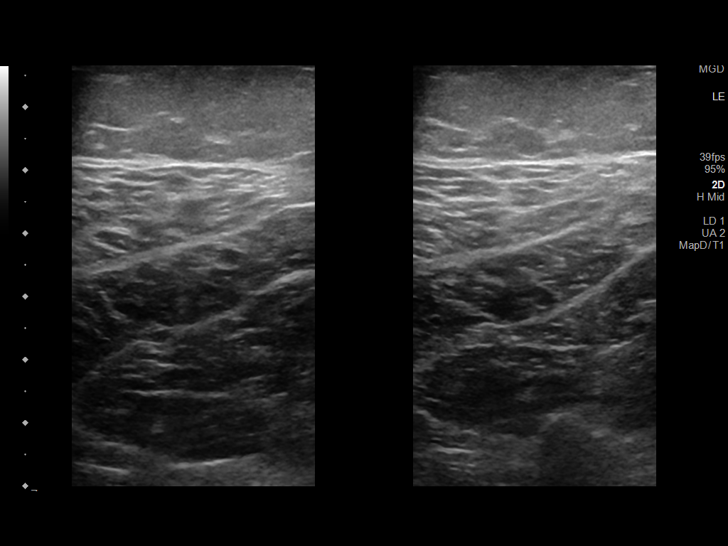
[im 38/42]
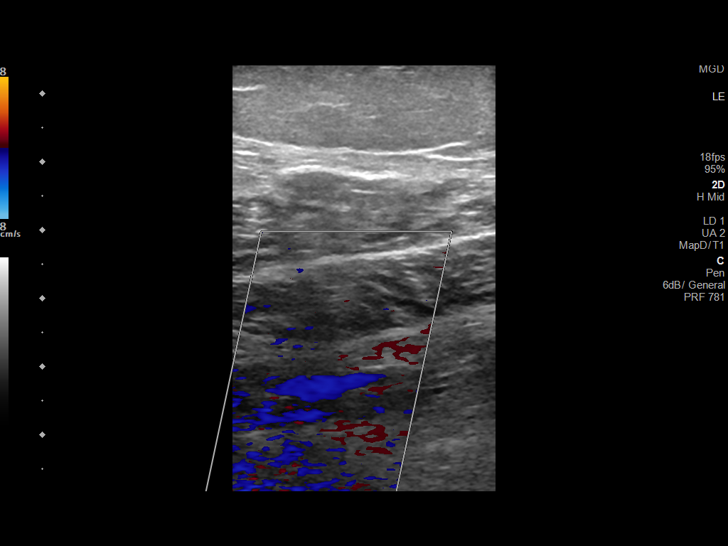
[im 42/42]
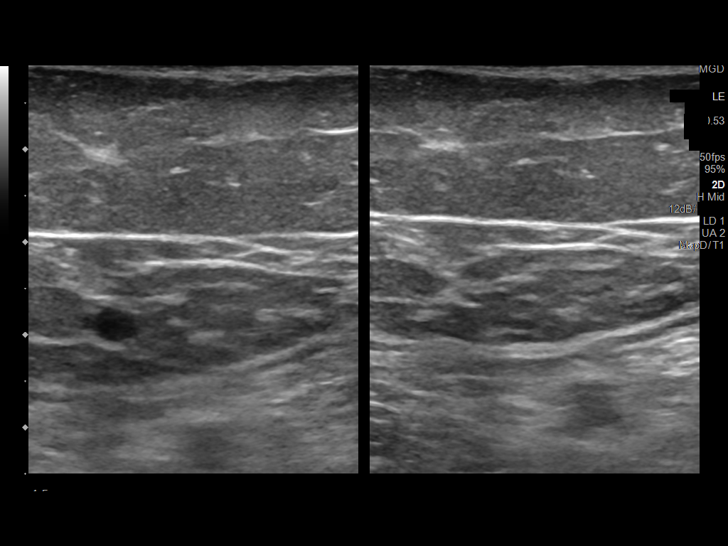

[14 of 24 positions shown; findings below may reference images not displayed]

FINDINGS: VENOUS

Normal compressibility of the common femoral, superficial femoral,
and popliteal veins, as well as the visualized calf veins.
Visualized portions of profunda femoral vein and great saphenous
vein unremarkable. No filling defects to suggest DVT on grayscale or
color Doppler imaging. Doppler waveforms show normal direction of
venous flow, normal respiratory plasticity and response to
augmentation.

Limited views of the contralateral common femoral vein are
unremarkable.

OTHER

None.

Limitations: none
IMPRESSION: There is no evidence of deep venous thrombosis in the left lower
extremity.

## 2022-11-20 DIAGNOSIS — Z87891 Personal history of nicotine dependence: Secondary | ICD-10-CM | POA: Diagnosis not present

## 2022-11-20 DIAGNOSIS — K5900 Constipation, unspecified: Secondary | ICD-10-CM | POA: Diagnosis not present

## 2022-11-20 DIAGNOSIS — R109 Unspecified abdominal pain: Secondary | ICD-10-CM | POA: Diagnosis not present

## 2022-11-21 DIAGNOSIS — R002 Palpitations: Secondary | ICD-10-CM | POA: Diagnosis not present

## 2022-11-21 DIAGNOSIS — I10 Essential (primary) hypertension: Secondary | ICD-10-CM | POA: Diagnosis not present

## 2022-11-21 DIAGNOSIS — I491 Atrial premature depolarization: Secondary | ICD-10-CM | POA: Diagnosis not present

## 2022-11-21 DIAGNOSIS — E119 Type 2 diabetes mellitus without complications: Secondary | ICD-10-CM | POA: Diagnosis not present

## 2022-11-21 DIAGNOSIS — Z87891 Personal history of nicotine dependence: Secondary | ICD-10-CM | POA: Diagnosis not present

## 2022-11-22 DIAGNOSIS — R0789 Other chest pain: Secondary | ICD-10-CM | POA: Diagnosis not present

## 2022-11-22 DIAGNOSIS — R002 Palpitations: Secondary | ICD-10-CM | POA: Diagnosis not present

## 2022-11-22 DIAGNOSIS — Z87891 Personal history of nicotine dependence: Secondary | ICD-10-CM | POA: Diagnosis not present

## 2022-11-22 DIAGNOSIS — K219 Gastro-esophageal reflux disease without esophagitis: Secondary | ICD-10-CM | POA: Diagnosis not present

## 2022-11-22 DIAGNOSIS — J45909 Unspecified asthma, uncomplicated: Secondary | ICD-10-CM | POA: Diagnosis not present

## 2022-11-22 DIAGNOSIS — I491 Atrial premature depolarization: Secondary | ICD-10-CM | POA: Diagnosis not present

## 2022-11-22 DIAGNOSIS — R079 Chest pain, unspecified: Secondary | ICD-10-CM | POA: Diagnosis not present

## 2022-11-22 DIAGNOSIS — E785 Hyperlipidemia, unspecified: Secondary | ICD-10-CM | POA: Diagnosis not present

## 2022-11-22 DIAGNOSIS — E119 Type 2 diabetes mellitus without complications: Secondary | ICD-10-CM | POA: Diagnosis not present

## 2022-11-22 DIAGNOSIS — I493 Ventricular premature depolarization: Secondary | ICD-10-CM | POA: Diagnosis not present

## 2022-11-22 DIAGNOSIS — I10 Essential (primary) hypertension: Secondary | ICD-10-CM | POA: Diagnosis not present

## 2022-11-22 DIAGNOSIS — R42 Dizziness and giddiness: Secondary | ICD-10-CM | POA: Diagnosis not present

## 2022-11-22 DIAGNOSIS — G35 Multiple sclerosis: Secondary | ICD-10-CM | POA: Diagnosis not present

## 2022-11-22 DIAGNOSIS — Z79899 Other long term (current) drug therapy: Secondary | ICD-10-CM | POA: Diagnosis not present

## 2022-11-22 DIAGNOSIS — I4581 Long QT syndrome: Secondary | ICD-10-CM | POA: Diagnosis not present

## 2022-11-24 ENCOUNTER — Encounter: Payer: Self-pay | Admitting: Family Medicine

## 2022-11-24 ENCOUNTER — Ambulatory Visit (INDEPENDENT_AMBULATORY_CARE_PROVIDER_SITE_OTHER): Payer: Medicare HMO | Admitting: Family Medicine

## 2022-11-24 VITALS — BP 142/78 | HR 53 | Wt 237.0 lb

## 2022-11-24 DIAGNOSIS — E1169 Type 2 diabetes mellitus with other specified complication: Secondary | ICD-10-CM

## 2022-11-24 DIAGNOSIS — Z556 Problems related to health literacy: Secondary | ICD-10-CM | POA: Diagnosis not present

## 2022-11-24 DIAGNOSIS — F331 Major depressive disorder, recurrent, moderate: Secondary | ICD-10-CM

## 2022-11-24 DIAGNOSIS — Z87891 Personal history of nicotine dependence: Secondary | ICD-10-CM | POA: Diagnosis not present

## 2022-11-24 DIAGNOSIS — Z90710 Acquired absence of both cervix and uterus: Secondary | ICD-10-CM | POA: Diagnosis not present

## 2022-11-24 DIAGNOSIS — K5909 Other constipation: Secondary | ICD-10-CM

## 2022-11-24 DIAGNOSIS — I10 Essential (primary) hypertension: Secondary | ICD-10-CM | POA: Diagnosis not present

## 2022-11-24 DIAGNOSIS — R002 Palpitations: Secondary | ICD-10-CM | POA: Diagnosis not present

## 2022-11-24 DIAGNOSIS — E119 Type 2 diabetes mellitus without complications: Secondary | ICD-10-CM | POA: Diagnosis not present

## 2022-11-24 DIAGNOSIS — R000 Tachycardia, unspecified: Secondary | ICD-10-CM | POA: Diagnosis not present

## 2022-11-24 DIAGNOSIS — E1165 Type 2 diabetes mellitus with hyperglycemia: Secondary | ICD-10-CM | POA: Diagnosis not present

## 2022-11-24 DIAGNOSIS — R1084 Generalized abdominal pain: Secondary | ICD-10-CM

## 2022-11-24 MED ORDER — METFORMIN HCL 500 MG PO TABS: 500.0000 mg | ORAL_TABLET | Freq: Two times a day (BID) | ORAL | 0 refills | Status: DC

## 2022-11-24 NOTE — Progress Notes (Unsigned)
   Established Patient Office Visit  Subjective   Patient ID: Jennifer [name], female    DOB: [birth year]  Age: 57 y.o. MRN: [id number]  Chief Complaint  Patient presents with   Follow-up         HPI  Hypertension- Pt denies chest pain, SOB, dizziness, or heart palpitations.  Taking meds as directed w/o problems.  Denies medication side effects.    F/U DM - hasn't started the medication yet.   {History (Optional):23778}  ROS    Objective:     BP (!) 159/66   Pulse (!) 53   Wt 237 lb (107.5 kg)   LMP 06/25/2013   SpO2 99%   BMI 43.35 kg/m  {Vitals History (Optional):23777}  Physical Exam   No results found for any visits on 11/24/22.  {Labs (Optional):23779}  The ASCVD Risk score (Arnett DK, et al., 2019) failed to calculate for the following reasons:   The patient has a prior MI or stroke diagnosis    Assessment & Plan:   Problem List Items Addressed This Visit       Cardiovascular and Mediastinum   Hypertension with intolerance to multiple antihypertensive drugs   Other Visit Diagnoses     Type 2 diabetes mellitus with other specified complication, without long-term current use of insulin    -  Primary   Relevant Medications   metFORMIN (GLUCOPHAGE) 500 MG tablet       No follow-ups on file.    Nani Gasser, MD

## 2022-11-25 DIAGNOSIS — I491 Atrial premature depolarization: Secondary | ICD-10-CM | POA: Diagnosis not present

## 2022-11-25 DIAGNOSIS — R000 Tachycardia, unspecified: Secondary | ICD-10-CM | POA: Diagnosis not present

## 2022-11-26 DIAGNOSIS — G35 Multiple sclerosis: Secondary | ICD-10-CM | POA: Diagnosis not present

## 2022-11-26 DIAGNOSIS — I10 Essential (primary) hypertension: Secondary | ICD-10-CM | POA: Diagnosis not present

## 2022-11-26 DIAGNOSIS — E271 Primary adrenocortical insufficiency: Secondary | ICD-10-CM | POA: Diagnosis not present

## 2022-11-26 DIAGNOSIS — E119 Type 2 diabetes mellitus without complications: Secondary | ICD-10-CM | POA: Diagnosis not present

## 2022-11-26 DIAGNOSIS — R42 Dizziness and giddiness: Secondary | ICD-10-CM | POA: Diagnosis not present

## 2022-11-26 DIAGNOSIS — Z79899 Other long term (current) drug therapy: Secondary | ICD-10-CM | POA: Diagnosis not present

## 2022-11-26 DIAGNOSIS — R079 Chest pain, unspecified: Secondary | ICD-10-CM | POA: Diagnosis not present

## 2022-11-26 DIAGNOSIS — Z7984 Long term (current) use of oral hypoglycemic drugs: Secondary | ICD-10-CM | POA: Diagnosis not present

## 2022-11-26 DIAGNOSIS — Z87891 Personal history of nicotine dependence: Secondary | ICD-10-CM | POA: Diagnosis not present

## 2022-11-26 DIAGNOSIS — R0789 Other chest pain: Secondary | ICD-10-CM | POA: Diagnosis not present

## 2022-11-27 ENCOUNTER — Encounter: Payer: Self-pay | Admitting: *Deleted

## 2022-11-27 DIAGNOSIS — E876 Hypokalemia: Secondary | ICD-10-CM | POA: Diagnosis not present

## 2022-11-27 DIAGNOSIS — E860 Dehydration: Secondary | ICD-10-CM | POA: Diagnosis not present

## 2022-11-27 DIAGNOSIS — I10 Essential (primary) hypertension: Secondary | ICD-10-CM | POA: Diagnosis not present

## 2022-11-27 DIAGNOSIS — I499 Cardiac arrhythmia, unspecified: Secondary | ICD-10-CM | POA: Diagnosis not present

## 2022-11-27 DIAGNOSIS — G8929 Other chronic pain: Secondary | ICD-10-CM | POA: Diagnosis not present

## 2022-11-27 DIAGNOSIS — Z79899 Other long term (current) drug therapy: Secondary | ICD-10-CM | POA: Diagnosis not present

## 2022-11-27 DIAGNOSIS — Z87891 Personal history of nicotine dependence: Secondary | ICD-10-CM | POA: Diagnosis not present

## 2022-11-27 DIAGNOSIS — F32A Depression, unspecified: Secondary | ICD-10-CM | POA: Diagnosis not present

## 2022-11-27 DIAGNOSIS — R42 Dizziness and giddiness: Secondary | ICD-10-CM | POA: Diagnosis not present

## 2022-11-27 DIAGNOSIS — I2510 Atherosclerotic heart disease of native coronary artery without angina pectoris: Secondary | ICD-10-CM | POA: Diagnosis not present

## 2022-11-27 DIAGNOSIS — R002 Palpitations: Secondary | ICD-10-CM | POA: Diagnosis not present

## 2022-11-27 DIAGNOSIS — E785 Hyperlipidemia, unspecified: Secondary | ICD-10-CM | POA: Diagnosis not present

## 2022-11-27 DIAGNOSIS — R079 Chest pain, unspecified: Secondary | ICD-10-CM | POA: Diagnosis not present

## 2022-11-27 DIAGNOSIS — R0789 Other chest pain: Secondary | ICD-10-CM | POA: Diagnosis not present

## 2022-11-27 DIAGNOSIS — E119 Type 2 diabetes mellitus without complications: Secondary | ICD-10-CM | POA: Diagnosis not present

## 2022-11-27 DIAGNOSIS — Z7984 Long term (current) use of oral hypoglycemic drugs: Secondary | ICD-10-CM | POA: Diagnosis not present

## 2022-11-27 DIAGNOSIS — E271 Primary adrenocortical insufficiency: Secondary | ICD-10-CM | POA: Diagnosis not present

## 2022-11-27 DIAGNOSIS — I491 Atrial premature depolarization: Secondary | ICD-10-CM | POA: Diagnosis not present

## 2022-11-27 DIAGNOSIS — G35 Multiple sclerosis: Secondary | ICD-10-CM | POA: Diagnosis not present

## 2022-11-27 DIAGNOSIS — F419 Anxiety disorder, unspecified: Secondary | ICD-10-CM | POA: Diagnosis not present

## 2022-11-28 NOTE — Assessment & Plan Note (Signed)
Still not well-controlled but has been working with cardiology.  Blood pressure in the low 140s today.

## 2022-11-28 NOTE — Assessment & Plan Note (Signed)
Having abdominal pain and constipation issues.  Did encourage her to go ahead and bump up her regimen to try to improve bowel movements.

## 2022-11-28 NOTE — Assessment & Plan Note (Signed)
Happy to hear that she is connecting with a therapist virtually.

## 2022-11-28 NOTE — Assessment & Plan Note (Signed)
Has not started medication again very hesitant and worried about potential side effects she is worried that she may not be able to hydrate very well.  We did discuss that she is actually been doing much better with that over the last couple of the years than she used to.  We also discussed working on diet and trying to stay as active as she is able to be and if she cannot exercise routinely because of the cardiac concerns but she can still do somewhat light resistance training and really focusing on her diet if she is not committed to starting medication at this point in time.  She will would be due for another A1c at the end of the month.

## 2022-12-01 ENCOUNTER — Encounter: Payer: Self-pay | Admitting: *Deleted

## 2022-12-01 ENCOUNTER — Telehealth: Payer: Self-pay

## 2022-12-01 ENCOUNTER — Other Ambulatory Visit: Payer: Self-pay

## 2022-12-01 ENCOUNTER — Telehealth: Payer: Self-pay | Admitting: *Deleted

## 2022-12-01 DIAGNOSIS — R21 Rash and other nonspecific skin eruption: Secondary | ICD-10-CM

## 2022-12-01 DIAGNOSIS — R6884 Jaw pain: Secondary | ICD-10-CM | POA: Diagnosis not present

## 2022-12-01 DIAGNOSIS — I493 Ventricular premature depolarization: Secondary | ICD-10-CM | POA: Diagnosis not present

## 2022-12-01 DIAGNOSIS — R42 Dizziness and giddiness: Secondary | ICD-10-CM | POA: Diagnosis not present

## 2022-12-01 DIAGNOSIS — F419 Anxiety disorder, unspecified: Secondary | ICD-10-CM | POA: Diagnosis not present

## 2022-12-01 DIAGNOSIS — I491 Atrial premature depolarization: Secondary | ICD-10-CM | POA: Diagnosis not present

## 2022-12-01 DIAGNOSIS — R531 Weakness: Secondary | ICD-10-CM | POA: Diagnosis not present

## 2022-12-01 MED ORDER — MUPIROCIN 2 % EX OINT
TOPICAL_OINTMENT | Freq: Two times a day (BID) | CUTANEOUS | 0 refills | Status: DC
Start: 2022-12-01 — End: 2023-01-05

## 2022-12-01 NOTE — Telephone Encounter (Signed)
Patient called - states she is having difficulty finding someone who accepts her insurance in relation to the prescription for the rollator . I called [facility]  [address]  in [place]  Phone [phone] spoke with [place] and was told that they do accept her insurance.   Called patient and let her know and she states that she is not happy with this company  and wanted to go somewhere else.   Mentioned [facility] in [place]. She states she stopped and spoke with someone there and the amount out of pocket was too much.   Patient states she will continue to search.

## 2022-12-01 NOTE — Telephone Encounter (Signed)
Spoke with DR. Metheney . She does feel that lab is accurate and that reference range is accurate.  Patient informed  .

## 2022-12-01 NOTE — Telephone Encounter (Signed)
I'm not ordering she I snot due. Im well aware her A1C is out of control which is why I have discussed starting medication fo rmonths.

## 2022-12-01 NOTE — Telephone Encounter (Signed)
Lotion that I would like to start using on her feet is called AmLactin.  It is not prescription it is over-the-counter.  Then I will send over the mupirocin ointment for spot treatment for the areas that look more cracked or raw or bleeding.  Meds ordered this encounter  Medications   mupirocin ointment (BACTROBAN) 2 %    Sig: Apply topically 2 (two) times daily. X 10 days prn.    Dispense:  30 g    Refill:  0

## 2022-12-01 NOTE — Telephone Encounter (Signed)
Patient called was asking that we send an order to [facility] for A1C  - results=6.5. But in checking her chart she just had an A1c done at [facility] On 11/28/2022.  She states that other facilities testing result "markers"  are different than ours.  I told her that these result ranges were pretty universal and we all go by the same numbers.  She would like to know if could be reviewed by Dr. Linford Arnold as last A1C in chart shown 7.3 . This was on 06/06/2022 at [facility]. She is requesting a return call.

## 2022-12-01 NOTE — Transitions of Care (Post Inpatient/ED Visit) (Signed)
   12/01/2022  Name: Jennifer [name] MRN: [id number] DOB: March 04, 1966  Today's TOC FU Call Status: Today's TOC FU Call Status:: Unsuccessul Call (1st Attempt) Unsuccessful Call (1st Attempt) Date: 12/01/22  Attempted to reach the patient regarding the most recent Inpatient visit; left HIPAA compliant voice message requesting call back  Follow Up Plan: Additional outreach attempts will be made to reach the patient to complete the Transitions of Care (Post Inpatient visit) call.   Caryl Pina, RN, BSN, CCRN Alumnus RN CM Care Coordination/ Transition of Care- [facility] Care Management [phone]: direct office

## 2022-12-01 NOTE — Telephone Encounter (Signed)
Patient called- states she had to cancel appt scheduled for Friday as she has a heart ablation scheduled with [facility] at 8:30 that morning She did  reschedule for Tuesday  12/05/22.  She states at her last appointment Dr. Linford Arnold told her she would send in two medications - one for skin breakdown of feet and a cream that starts with an "A"? She can not remember what these medications were but thinks one might have been Mupirocin. Pharmacy does not have orders for either.   Patient states she can not take the metformin- she states she pill is too large. States she could not take the Rhythmol while in hospital either -  pill got stuck in throat-  as these are the same size but metformin is thicker. she knows she can not take the metformin.

## 2022-12-01 NOTE — Telephone Encounter (Signed)
Patient informed. 

## 2022-12-04 DIAGNOSIS — R000 Tachycardia, unspecified: Secondary | ICD-10-CM | POA: Diagnosis not present

## 2022-12-04 DIAGNOSIS — G35 Multiple sclerosis: Secondary | ICD-10-CM | POA: Diagnosis not present

## 2022-12-04 DIAGNOSIS — I4891 Unspecified atrial fibrillation: Secondary | ICD-10-CM | POA: Diagnosis not present

## 2022-12-04 DIAGNOSIS — R079 Chest pain, unspecified: Secondary | ICD-10-CM | POA: Diagnosis not present

## 2022-12-04 DIAGNOSIS — R0789 Other chest pain: Secondary | ICD-10-CM | POA: Diagnosis not present

## 2022-12-04 DIAGNOSIS — Z8542 Personal history of malignant neoplasm of other parts of uterus: Secondary | ICD-10-CM | POA: Diagnosis not present

## 2022-12-04 DIAGNOSIS — I491 Atrial premature depolarization: Secondary | ICD-10-CM | POA: Diagnosis not present

## 2022-12-04 DIAGNOSIS — E785 Hyperlipidemia, unspecified: Secondary | ICD-10-CM | POA: Diagnosis not present

## 2022-12-04 DIAGNOSIS — R002 Palpitations: Secondary | ICD-10-CM | POA: Diagnosis not present

## 2022-12-04 DIAGNOSIS — R9431 Abnormal electrocardiogram [ECG] [EKG]: Secondary | ICD-10-CM | POA: Diagnosis not present

## 2022-12-04 DIAGNOSIS — E119 Type 2 diabetes mellitus without complications: Secondary | ICD-10-CM | POA: Diagnosis not present

## 2022-12-04 DIAGNOSIS — K219 Gastro-esophageal reflux disease without esophagitis: Secondary | ICD-10-CM | POA: Diagnosis not present

## 2022-12-04 DIAGNOSIS — I10 Essential (primary) hypertension: Secondary | ICD-10-CM | POA: Diagnosis not present

## 2022-12-05 ENCOUNTER — Encounter: Payer: Self-pay | Admitting: Family Medicine

## 2022-12-05 ENCOUNTER — Telehealth (INDEPENDENT_AMBULATORY_CARE_PROVIDER_SITE_OTHER): Payer: Self-pay | Admitting: Family Medicine

## 2022-12-05 DIAGNOSIS — R000 Tachycardia, unspecified: Secondary | ICD-10-CM | POA: Diagnosis not present

## 2022-12-05 DIAGNOSIS — Z91199 Patient's noncompliance with other medical treatment and regimen due to unspecified reason: Secondary | ICD-10-CM

## 2022-12-05 DIAGNOSIS — R0789 Other chest pain: Secondary | ICD-10-CM | POA: Diagnosis not present

## 2022-12-05 DIAGNOSIS — R079 Chest pain, unspecified: Secondary | ICD-10-CM | POA: Diagnosis not present

## 2022-12-05 NOTE — Progress Notes (Unsigned)
    Virtual Visit via Video Note  I connected with DAJANE VALLI on 12/05/22 at  8:30 AM EDT by a video enabled telemedicine application and verified that I am speaking with the correct person using two identifiers.   I discussed the limitations of evaluation and management by telemedicine and the availability of in person appointments. The patient expressed understanding and agreed to proceed.  Patient location: at home Provider location: in office  Subjective:    CC:  No chief complaint on file.   HPI:    Past medical history, Surgical history, Family history not pertinant except as noted below, Social history, Allergies, and medications have been entered into the medical record, reviewed, and corrections made.    Objective:    General: Speaking clearly in complete sentences without any shortness of breath.  Alert and oriented x3.  Normal judgment. No apparent acute distress.    Impression and Recommendations:    Problem List Items Addressed This Visit   None   No orders of the defined types were placed in this encounter.   No orders of the defined types were placed in this encounter.    I discussed the assessment and treatment plan with the patient. The patient was provided an opportunity to ask questions and all were answered. The patient agreed with the plan and demonstrated an understanding of the instructions.   The patient was advised to call back or seek an in-person evaluation if the symptoms worsen or if the condition fails to improve as anticipated.   Nani Gasser, MD

## 2022-12-05 NOTE — Progress Notes (Signed)
Called pt to inform her that I was calling to do her prescreening no answer. Advised that she log in.

## 2022-12-06 DIAGNOSIS — R000 Tachycardia, unspecified: Secondary | ICD-10-CM | POA: Diagnosis not present

## 2022-12-06 DIAGNOSIS — R079 Chest pain, unspecified: Secondary | ICD-10-CM | POA: Diagnosis not present

## 2022-12-06 DIAGNOSIS — R002 Palpitations: Secondary | ICD-10-CM | POA: Diagnosis not present

## 2022-12-06 DIAGNOSIS — I491 Atrial premature depolarization: Secondary | ICD-10-CM | POA: Diagnosis not present

## 2022-12-06 DIAGNOSIS — R0789 Other chest pain: Secondary | ICD-10-CM | POA: Diagnosis not present

## 2022-12-07 DIAGNOSIS — I491 Atrial premature depolarization: Secondary | ICD-10-CM | POA: Diagnosis not present

## 2022-12-08 ENCOUNTER — Ambulatory Visit: Payer: Medicare HMO | Admitting: Family Medicine

## 2022-12-08 DIAGNOSIS — Z6841 Body Mass Index (BMI) 40.0 and over, adult: Secondary | ICD-10-CM | POA: Diagnosis not present

## 2022-12-08 DIAGNOSIS — I471 Supraventricular tachycardia, unspecified: Secondary | ICD-10-CM | POA: Diagnosis not present

## 2022-12-08 DIAGNOSIS — E119 Type 2 diabetes mellitus without complications: Secondary | ICD-10-CM | POA: Diagnosis not present

## 2022-12-08 DIAGNOSIS — E876 Hypokalemia: Secondary | ICD-10-CM | POA: Diagnosis not present

## 2022-12-08 DIAGNOSIS — G4733 Obstructive sleep apnea (adult) (pediatric): Secondary | ICD-10-CM | POA: Diagnosis not present

## 2022-12-08 DIAGNOSIS — I491 Atrial premature depolarization: Secondary | ICD-10-CM | POA: Diagnosis not present

## 2022-12-08 DIAGNOSIS — E785 Hyperlipidemia, unspecified: Secondary | ICD-10-CM | POA: Diagnosis not present

## 2022-12-08 DIAGNOSIS — I4719 Other supraventricular tachycardia: Secondary | ICD-10-CM | POA: Diagnosis not present

## 2022-12-08 DIAGNOSIS — I10 Essential (primary) hypertension: Secondary | ICD-10-CM | POA: Diagnosis not present

## 2022-12-08 DIAGNOSIS — Z87891 Personal history of nicotine dependence: Secondary | ICD-10-CM | POA: Diagnosis not present

## 2022-12-08 DIAGNOSIS — K219 Gastro-esophageal reflux disease without esophagitis: Secondary | ICD-10-CM | POA: Diagnosis not present

## 2022-12-08 DIAGNOSIS — G35 Multiple sclerosis: Secondary | ICD-10-CM | POA: Diagnosis not present

## 2022-12-09 DIAGNOSIS — R109 Unspecified abdominal pain: Secondary | ICD-10-CM | POA: Diagnosis not present

## 2022-12-09 DIAGNOSIS — R509 Fever, unspecified: Secondary | ICD-10-CM | POA: Diagnosis not present

## 2022-12-09 DIAGNOSIS — R000 Tachycardia, unspecified: Secondary | ICD-10-CM | POA: Diagnosis not present

## 2022-12-09 DIAGNOSIS — K2100 Gastro-esophageal reflux disease with esophagitis, without bleeding: Secondary | ICD-10-CM | POA: Diagnosis not present

## 2022-12-09 DIAGNOSIS — R1010 Upper abdominal pain, unspecified: Secondary | ICD-10-CM | POA: Diagnosis not present

## 2022-12-09 DIAGNOSIS — G35 Multiple sclerosis: Secondary | ICD-10-CM | POA: Diagnosis not present

## 2022-12-09 DIAGNOSIS — Z789 Other specified health status: Secondary | ICD-10-CM | POA: Diagnosis not present

## 2022-12-09 DIAGNOSIS — R778 Other specified abnormalities of plasma proteins: Secondary | ICD-10-CM | POA: Diagnosis not present

## 2022-12-09 DIAGNOSIS — Z20822 Contact with and (suspected) exposure to covid-19: Secondary | ICD-10-CM | POA: Diagnosis not present

## 2022-12-09 DIAGNOSIS — R079 Chest pain, unspecified: Secondary | ICD-10-CM | POA: Diagnosis not present

## 2022-12-09 DIAGNOSIS — R110 Nausea: Secondary | ICD-10-CM | POA: Diagnosis not present

## 2022-12-09 DIAGNOSIS — R531 Weakness: Secondary | ICD-10-CM | POA: Diagnosis not present

## 2022-12-09 DIAGNOSIS — Z6841 Body Mass Index (BMI) 40.0 and over, adult: Secondary | ICD-10-CM | POA: Diagnosis not present

## 2022-12-09 DIAGNOSIS — G4733 Obstructive sleep apnea (adult) (pediatric): Secondary | ICD-10-CM | POA: Diagnosis not present

## 2022-12-09 DIAGNOSIS — I491 Atrial premature depolarization: Secondary | ICD-10-CM | POA: Diagnosis not present

## 2022-12-09 DIAGNOSIS — I10 Essential (primary) hypertension: Secondary | ICD-10-CM | POA: Diagnosis not present

## 2022-12-09 DIAGNOSIS — Z87891 Personal history of nicotine dependence: Secondary | ICD-10-CM | POA: Diagnosis not present

## 2022-12-09 DIAGNOSIS — R9431 Abnormal electrocardiogram [ECG] [EKG]: Secondary | ICD-10-CM | POA: Diagnosis not present

## 2022-12-09 DIAGNOSIS — R7989 Other specified abnormal findings of blood chemistry: Secondary | ICD-10-CM | POA: Diagnosis not present

## 2022-12-09 DIAGNOSIS — E876 Hypokalemia: Secondary | ICD-10-CM | POA: Diagnosis not present

## 2022-12-09 DIAGNOSIS — J029 Acute pharyngitis, unspecified: Secondary | ICD-10-CM | POA: Diagnosis not present

## 2022-12-09 DIAGNOSIS — Z9049 Acquired absence of other specified parts of digestive tract: Secondary | ICD-10-CM | POA: Diagnosis not present

## 2022-12-09 DIAGNOSIS — E785 Hyperlipidemia, unspecified: Secondary | ICD-10-CM | POA: Diagnosis not present

## 2022-12-09 DIAGNOSIS — K219 Gastro-esophageal reflux disease without esophagitis: Secondary | ICD-10-CM | POA: Diagnosis not present

## 2022-12-09 DIAGNOSIS — R0789 Other chest pain: Secondary | ICD-10-CM | POA: Diagnosis not present

## 2022-12-10 DIAGNOSIS — R079 Chest pain, unspecified: Secondary | ICD-10-CM | POA: Diagnosis not present

## 2022-12-10 DIAGNOSIS — E782 Mixed hyperlipidemia: Secondary | ICD-10-CM | POA: Diagnosis not present

## 2022-12-10 DIAGNOSIS — I10 Essential (primary) hypertension: Secondary | ICD-10-CM | POA: Diagnosis not present

## 2022-12-10 DIAGNOSIS — R7989 Other specified abnormal findings of blood chemistry: Secondary | ICD-10-CM | POA: Diagnosis not present

## 2022-12-10 DIAGNOSIS — R0789 Other chest pain: Secondary | ICD-10-CM | POA: Diagnosis not present

## 2022-12-11 DIAGNOSIS — R109 Unspecified abdominal pain: Secondary | ICD-10-CM | POA: Diagnosis not present

## 2022-12-11 DIAGNOSIS — R002 Palpitations: Secondary | ICD-10-CM | POA: Diagnosis not present

## 2022-12-11 DIAGNOSIS — R079 Chest pain, unspecified: Secondary | ICD-10-CM | POA: Diagnosis not present

## 2022-12-11 DIAGNOSIS — R0789 Other chest pain: Secondary | ICD-10-CM | POA: Diagnosis not present

## 2022-12-12 DIAGNOSIS — R0789 Other chest pain: Secondary | ICD-10-CM | POA: Diagnosis not present

## 2022-12-12 DIAGNOSIS — R079 Chest pain, unspecified: Secondary | ICD-10-CM | POA: Diagnosis not present

## 2022-12-13 ENCOUNTER — Other Ambulatory Visit: Payer: Medicare HMO | Admitting: Pharmacist

## 2022-12-13 DIAGNOSIS — R0789 Other chest pain: Secondary | ICD-10-CM | POA: Diagnosis not present

## 2022-12-13 DIAGNOSIS — A0811 Acute gastroenteropathy due to Norwalk agent: Secondary | ICD-10-CM | POA: Diagnosis not present

## 2022-12-13 NOTE — Progress Notes (Signed)
12/13/2022 Name: Jennifer [name] MRN: [id number] DOB: March 23, 1966   Jennifer [name] is a 57 y.o. year old female who presented for a telephone visit. Of note, patient is not feeling well today and reports a viral illness. Also has been in and out of hospital past few weeks. Did not complete full medication review, at patient's request, to shorten our call and focus on resting. Counseled patient to maintain adequate hydration.   They were referred to the pharmacist by  incoming call to PCP office  for assistance in managing diabetes and medication questions .   Subjective:  Care Team: Primary Care Provider: Agapito Games, MD ; Next Scheduled Visit: 11/16/22  Medication Access/Adherence  Current Pharmacy:  [facility] [phone] - [place], [place] - [address] [place] [address] Phone: [phone] Fax: [phone]    Diabetes:  Current medications: jardiance 10mg  daily, has not started yet as of 12/13/22 phone visit. She reports recent BG values in ~100s.  Jardiance would be initial diabetes therapy, not ideal GLP1 agonist candidate if hx pancreatitis, and patient declines metformin due to pill size  Medications tried in the past:  - metformin: did not start due to large pill size  - januvia: did not start due to pancreatitis concerns - rybelsus: did not start due to pancreatitis concerns    Objective:  Lab Results  Component Value Date   HGBA1C 7.6 (A) 09/08/2022    Lab Results  Component Value Date   CREATININE 0.89 11/03/2022   BUN 18 11/03/2022   NA 137 11/03/2022   K 3.8 11/03/2022   CL 103 11/03/2022   CO2 25 11/03/2022    Lab Results  Component Value Date   CHOL 170 01/24/2018   HDL 38 01/24/2018   LDLCALC 92 01/24/2018   LDLDIRECT 110 (H) 08/19/2012   TRIG 165 (H) 07/30/2020   CHOLHDL 4.6 11/20/2013    Medications Reviewed Today     Reviewed by Michaela Corner, RN (Registered Nurse) on  12/01/22 at 1411  Med List Status: <None>   Medication Order Taking? Sig Documenting Provider Last Dose Status Informant  ACCU-CHEK GUIDE test strip [id number] No For testing blood sugars 2 times daily. E11.65 Agapito Games, MD Taking Active   Accu-Chek Softclix Lancets lancets [id number] No 1 each by Other route 2 (two) times daily. Agapito Games, MD Taking Active   Kipp Laurence MEDICATION [id number] No Medication Name: standing order BMP x 1 year Agapito Games, MD Taking Active   blood glucose meter kit and supplies [id number] No Dispense based on patient and insurance preference. Use to check blood sugars twice daily E11.9 Agapito Games, MD Taking Active   empagliflozin (JARDIANCE) 10 MG TABS tablet [id number] No Take 1 tablet (10 mg total) by mouth daily before breakfast. Agapito Games, MD Taking Active   flunisolide (NASALIDE) 25 MCG/ACT (0.025%) SOLN [id number] No Place 1 spray into the nose daily as needed. Birder Robson, MD Taking Active   hydrALAZINE (APRESOLINE) 10 MG tablet [id number] No Take 1 tablet (10 mg total) by mouth 3 (three) times daily. Agapito Games, MD Taking Active   levalbuterol [facility] HFA) 45 MCG/ACT inhaler [id number] No Inhale 1 puff into the lungs every 4 (four) hours as needed for wheezing or shortness of breath. Birder Robson, MD Taking Active   metFORMIN (GLUCOPHAGE) 500 MG tablet [id number]  Take 1 tablet (  500 mg total) by mouth 2 (two) times daily with a meal. Agapito Games, MD  Active   metoprolol tartrate (LOPRESSOR) 50 MG tablet [id number] No TAKE ONE TABLET BY MOUTH TWICE A DAY Agapito Games, MD Taking Active   mupirocin ointment (BACTROBAN) 2 % [id number]  Apply topically 2 (two) times daily. X 10 days prn. Agapito Games, MD  Active   potassium chloride (KLOR-CON) 20 MEQ packet [id number] No Take 20 mEq by mouth daily. Agapito Games, MD Taking Active   Med List Note Agapito Games, MD 08/29/21 1622):                 Assessment/Plan:   Diabetes: - Currently uncontrolled, previously counseled patient on risk/benefit of various medications and classes of diabetes medications - Recommend to initiate jardiance 10mg  daily when recovered from current viral illness.  Previously counseled patient on new medication, answered questions   Follow Up Plan: 1-2 months or as patient desires  Lynnda Shields, PharmD, BCPS Clinical Pharmacist [facility]

## 2022-12-13 NOTE — Patient Instructions (Signed)
[name],  Hope you are feeling better quickly. Don't forget to stay well hydrated and focus on rest.   Start jardiance medication for blood sugars as soon as you have recovered.  Take care, Jennifer [name], PharmD, BCPS Clinical Pharmacist [facility]

## 2022-12-14 DIAGNOSIS — R000 Tachycardia, unspecified: Secondary | ICD-10-CM | POA: Diagnosis not present

## 2022-12-15 DIAGNOSIS — R0789 Other chest pain: Secondary | ICD-10-CM | POA: Diagnosis not present

## 2022-12-15 DIAGNOSIS — T7840XA Allergy, unspecified, initial encounter: Secondary | ICD-10-CM | POA: Diagnosis not present

## 2022-12-15 DIAGNOSIS — I10 Essential (primary) hypertension: Secondary | ICD-10-CM | POA: Diagnosis not present

## 2022-12-15 DIAGNOSIS — B349 Viral infection, unspecified: Secondary | ICD-10-CM | POA: Diagnosis not present

## 2022-12-15 DIAGNOSIS — R079 Chest pain, unspecified: Secondary | ICD-10-CM | POA: Diagnosis not present

## 2022-12-16 DIAGNOSIS — Z7984 Long term (current) use of oral hypoglycemic drugs: Secondary | ICD-10-CM | POA: Diagnosis not present

## 2022-12-16 DIAGNOSIS — R1031 Right lower quadrant pain: Secondary | ICD-10-CM | POA: Diagnosis not present

## 2022-12-16 DIAGNOSIS — Z9889 Other specified postprocedural states: Secondary | ICD-10-CM | POA: Diagnosis not present

## 2022-12-16 DIAGNOSIS — Z79899 Other long term (current) drug therapy: Secondary | ICD-10-CM | POA: Diagnosis not present

## 2022-12-16 DIAGNOSIS — R0789 Other chest pain: Secondary | ICD-10-CM | POA: Diagnosis not present

## 2022-12-16 DIAGNOSIS — E669 Obesity, unspecified: Secondary | ICD-10-CM | POA: Diagnosis not present

## 2022-12-16 DIAGNOSIS — E1165 Type 2 diabetes mellitus with hyperglycemia: Secondary | ICD-10-CM | POA: Diagnosis not present

## 2022-12-16 DIAGNOSIS — R079 Chest pain, unspecified: Secondary | ICD-10-CM | POA: Diagnosis not present

## 2022-12-17 DIAGNOSIS — R000 Tachycardia, unspecified: Secondary | ICD-10-CM | POA: Diagnosis not present

## 2022-12-18 DIAGNOSIS — R002 Palpitations: Secondary | ICD-10-CM | POA: Diagnosis not present

## 2022-12-18 DIAGNOSIS — I10 Essential (primary) hypertension: Secondary | ICD-10-CM | POA: Diagnosis not present

## 2022-12-18 DIAGNOSIS — I499 Cardiac arrhythmia, unspecified: Secondary | ICD-10-CM | POA: Diagnosis not present

## 2022-12-18 DIAGNOSIS — I491 Atrial premature depolarization: Secondary | ICD-10-CM | POA: Diagnosis not present

## 2022-12-18 DIAGNOSIS — I4719 Other supraventricular tachycardia: Secondary | ICD-10-CM | POA: Diagnosis not present

## 2022-12-18 DIAGNOSIS — I493 Ventricular premature depolarization: Secondary | ICD-10-CM | POA: Diagnosis not present

## 2022-12-19 DIAGNOSIS — R1030 Lower abdominal pain, unspecified: Secondary | ICD-10-CM | POA: Diagnosis not present

## 2022-12-19 DIAGNOSIS — R109 Unspecified abdominal pain: Secondary | ICD-10-CM | POA: Diagnosis not present

## 2022-12-19 DIAGNOSIS — E531 Pyridoxine deficiency: Secondary | ICD-10-CM | POA: Diagnosis not present

## 2022-12-19 DIAGNOSIS — I471 Supraventricular tachycardia, unspecified: Secondary | ICD-10-CM | POA: Diagnosis not present

## 2022-12-19 DIAGNOSIS — R002 Palpitations: Secondary | ICD-10-CM | POA: Diagnosis not present

## 2022-12-19 DIAGNOSIS — I10 Essential (primary) hypertension: Secondary | ICD-10-CM | POA: Diagnosis not present

## 2022-12-19 DIAGNOSIS — I4719 Other supraventricular tachycardia: Secondary | ICD-10-CM | POA: Diagnosis not present

## 2022-12-19 DIAGNOSIS — E611 Iron deficiency: Secondary | ICD-10-CM | POA: Diagnosis not present

## 2022-12-19 DIAGNOSIS — E782 Mixed hyperlipidemia: Secondary | ICD-10-CM | POA: Diagnosis not present

## 2022-12-19 DIAGNOSIS — Z87891 Personal history of nicotine dependence: Secondary | ICD-10-CM | POA: Diagnosis not present

## 2022-12-20 DIAGNOSIS — R109 Unspecified abdominal pain: Secondary | ICD-10-CM | POA: Diagnosis not present

## 2022-12-20 DIAGNOSIS — R1030 Lower abdominal pain, unspecified: Secondary | ICD-10-CM | POA: Diagnosis not present

## 2022-12-21 DIAGNOSIS — R079 Chest pain, unspecified: Secondary | ICD-10-CM | POA: Diagnosis not present

## 2022-12-21 DIAGNOSIS — R002 Palpitations: Secondary | ICD-10-CM | POA: Diagnosis not present

## 2022-12-21 DIAGNOSIS — R0789 Other chest pain: Secondary | ICD-10-CM | POA: Diagnosis not present

## 2022-12-21 DIAGNOSIS — I491 Atrial premature depolarization: Secondary | ICD-10-CM | POA: Diagnosis not present

## 2022-12-21 DIAGNOSIS — Z87891 Personal history of nicotine dependence: Secondary | ICD-10-CM | POA: Diagnosis not present

## 2022-12-21 DIAGNOSIS — I471 Supraventricular tachycardia, unspecified: Secondary | ICD-10-CM | POA: Diagnosis not present

## 2022-12-21 DIAGNOSIS — G4730 Sleep apnea, unspecified: Secondary | ICD-10-CM | POA: Diagnosis not present

## 2022-12-21 DIAGNOSIS — E119 Type 2 diabetes mellitus without complications: Secondary | ICD-10-CM | POA: Diagnosis not present

## 2022-12-21 DIAGNOSIS — G35 Multiple sclerosis: Secondary | ICD-10-CM | POA: Diagnosis not present

## 2022-12-21 DIAGNOSIS — R9431 Abnormal electrocardiogram [ECG] [EKG]: Secondary | ICD-10-CM | POA: Diagnosis not present

## 2022-12-21 DIAGNOSIS — E785 Hyperlipidemia, unspecified: Secondary | ICD-10-CM | POA: Diagnosis not present

## 2022-12-21 DIAGNOSIS — I10 Essential (primary) hypertension: Secondary | ICD-10-CM | POA: Diagnosis not present

## 2022-12-22 ENCOUNTER — Ambulatory Visit (INDEPENDENT_AMBULATORY_CARE_PROVIDER_SITE_OTHER): Payer: Medicare HMO | Admitting: Family Medicine

## 2022-12-22 VITALS — BP 155/90 | HR 105 | Ht 62.0 in | Wt 232.0 lb

## 2022-12-22 DIAGNOSIS — I10 Essential (primary) hypertension: Secondary | ICD-10-CM | POA: Diagnosis not present

## 2022-12-22 DIAGNOSIS — E1169 Type 2 diabetes mellitus with other specified complication: Secondary | ICD-10-CM | POA: Diagnosis not present

## 2022-12-22 DIAGNOSIS — E538 Deficiency of other specified B group vitamins: Secondary | ICD-10-CM

## 2022-12-22 DIAGNOSIS — E118 Type 2 diabetes mellitus with unspecified complications: Secondary | ICD-10-CM | POA: Diagnosis not present

## 2022-12-22 DIAGNOSIS — I471 Supraventricular tachycardia, unspecified: Secondary | ICD-10-CM | POA: Diagnosis not present

## 2022-12-22 DIAGNOSIS — I700 Atherosclerosis of aorta: Secondary | ICD-10-CM

## 2022-12-22 DIAGNOSIS — M542 Cervicalgia: Secondary | ICD-10-CM | POA: Diagnosis not present

## 2022-12-22 DIAGNOSIS — E876 Hypokalemia: Secondary | ICD-10-CM | POA: Diagnosis not present

## 2022-12-22 DIAGNOSIS — Z6841 Body Mass Index (BMI) 40.0 and over, adult: Secondary | ICD-10-CM

## 2022-12-22 DIAGNOSIS — E269 Hyperaldosteronism, unspecified: Secondary | ICD-10-CM

## 2022-12-22 DIAGNOSIS — R000 Tachycardia, unspecified: Secondary | ICD-10-CM | POA: Diagnosis not present

## 2022-12-22 LAB — POCT GLYCOSYLATED HEMOGLOBIN (HGB A1C): Hemoglobin A1C: 6.4 % — AB (ref 4.0–5.6)

## 2022-12-22 NOTE — Assessment & Plan Note (Signed)
Urged her to restart her metoprolol.

## 2022-12-22 NOTE — Assessment & Plan Note (Signed)
Continue to work on healthy diet and weight loss. She is doing great!
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
## 2022-12-22 NOTE — Assessment & Plan Note (Signed)
Still having symptoms after recent ablation she is planning on following back up with cardiology after she gets her heart monitor off next week.

## 2022-12-22 NOTE — Assessment & Plan Note (Signed)
Not on a statin, not wanting to start a new med right now.

## 2022-12-22 NOTE — Assessment & Plan Note (Signed)
A1c 6.4 today which is much improved from 7.6.  She is really done a great job.  She still has not started the diabetes medication but if we can keep the A1c under control then we may be able to hold off on medication if she can be diet controlled we will continue to monitor carefully and plan to recheck A1c in 3 months.

## 2022-12-23 LAB — BASIC METABOLIC PANEL WITH GFR
BUN: 19 mg/dL (ref 7–25)
CO2: 22 mmol/L (ref 20–32)
Calcium: 9.2 mg/dL (ref 8.6–10.4)
Chloride: 109 mmol/L (ref 98–110)
Creat: 0.81 mg/dL (ref 0.50–1.03)
Glucose, Bld: 155 mg/dL — ABNORMAL HIGH (ref 65–99)
Potassium: 3.8 mmol/L (ref 3.5–5.3)
Sodium: 142 mmol/L (ref 135–146)
eGFR: 85 mL/min/{1.73_m2} (ref >=60)

## 2022-12-23 LAB — MICROALBUMIN / CREATININE URINE RATIO
Creatinine, Urine: 173 mg/dL (ref 20–275)
Microalb Creat Ratio: 4 mg/g creat (ref <30)
Microalb, Ur: 0.7 mg/dL

## 2022-12-23 LAB — VITAMIN B12: Vitamin B-12: 396 pg/mL (ref 200–1100)

## 2022-12-25 ENCOUNTER — Telehealth: Payer: Self-pay | Admitting: Family Medicine

## 2022-12-25 DIAGNOSIS — Z7984 Long term (current) use of oral hypoglycemic drugs: Secondary | ICD-10-CM | POA: Diagnosis not present

## 2022-12-25 DIAGNOSIS — E876 Hypokalemia: Secondary | ICD-10-CM | POA: Diagnosis not present

## 2022-12-25 DIAGNOSIS — R002 Palpitations: Secondary | ICD-10-CM | POA: Diagnosis not present

## 2022-12-25 DIAGNOSIS — I10 Essential (primary) hypertension: Secondary | ICD-10-CM | POA: Diagnosis not present

## 2022-12-25 DIAGNOSIS — R000 Tachycardia, unspecified: Secondary | ICD-10-CM | POA: Diagnosis not present

## 2022-12-25 DIAGNOSIS — F411 Generalized anxiety disorder: Secondary | ICD-10-CM | POA: Diagnosis not present

## 2022-12-25 DIAGNOSIS — E1165 Type 2 diabetes mellitus with hyperglycemia: Secondary | ICD-10-CM | POA: Diagnosis not present

## 2022-12-25 DIAGNOSIS — F419 Anxiety disorder, unspecified: Secondary | ICD-10-CM | POA: Diagnosis not present

## 2022-12-25 NOTE — Progress Notes (Signed)
Hi Freddy, metabolic panel looks okay.  No protein in the urine which is great.  Normal vitamin B12.

## 2022-12-25 NOTE — Telephone Encounter (Signed)
Pt called. She would like another referral for a sleep study with a Bi-Pap machine  to [facility]. Pt states that she does not want a consult only a referral.

## 2022-12-29 DIAGNOSIS — Z20822 Contact with and (suspected) exposure to covid-19: Secondary | ICD-10-CM | POA: Diagnosis not present

## 2022-12-29 DIAGNOSIS — J0110 Acute frontal sinusitis, unspecified: Secondary | ICD-10-CM | POA: Diagnosis not present

## 2022-12-29 DIAGNOSIS — R079 Chest pain, unspecified: Secondary | ICD-10-CM | POA: Diagnosis not present

## 2022-12-29 DIAGNOSIS — R000 Tachycardia, unspecified: Secondary | ICD-10-CM | POA: Diagnosis not present

## 2023-01-01 DIAGNOSIS — E611 Iron deficiency: Secondary | ICD-10-CM | POA: Diagnosis not present

## 2023-01-01 DIAGNOSIS — E531 Pyridoxine deficiency: Secondary | ICD-10-CM | POA: Diagnosis not present

## 2023-01-01 DIAGNOSIS — I4719 Other supraventricular tachycardia: Secondary | ICD-10-CM | POA: Diagnosis not present

## 2023-01-01 DIAGNOSIS — I10 Essential (primary) hypertension: Secondary | ICD-10-CM | POA: Diagnosis not present

## 2023-01-01 DIAGNOSIS — I471 Supraventricular tachycardia, unspecified: Secondary | ICD-10-CM | POA: Diagnosis not present

## 2023-01-01 DIAGNOSIS — Z79899 Other long term (current) drug therapy: Secondary | ICD-10-CM | POA: Diagnosis not present

## 2023-01-01 DIAGNOSIS — R002 Palpitations: Secondary | ICD-10-CM | POA: Diagnosis not present

## 2023-01-01 DIAGNOSIS — E782 Mixed hyperlipidemia: Secondary | ICD-10-CM | POA: Diagnosis not present

## 2023-01-02 DIAGNOSIS — R000 Tachycardia, unspecified: Secondary | ICD-10-CM | POA: Diagnosis not present

## 2023-01-02 DIAGNOSIS — M79644 Pain in right finger(s): Secondary | ICD-10-CM | POA: Diagnosis not present

## 2023-01-02 DIAGNOSIS — M79641 Pain in right hand: Secondary | ICD-10-CM | POA: Diagnosis not present

## 2023-01-02 DIAGNOSIS — Z556 Problems related to health literacy: Secondary | ICD-10-CM | POA: Diagnosis not present

## 2023-01-02 DIAGNOSIS — M19041 Primary osteoarthritis, right hand: Secondary | ICD-10-CM | POA: Diagnosis not present

## 2023-01-03 ENCOUNTER — Telehealth: Payer: Self-pay | Admitting: Family Medicine

## 2023-01-03 DIAGNOSIS — I10 Essential (primary) hypertension: Secondary | ICD-10-CM | POA: Diagnosis not present

## 2023-01-03 DIAGNOSIS — E785 Hyperlipidemia, unspecified: Secondary | ICD-10-CM | POA: Diagnosis not present

## 2023-01-03 DIAGNOSIS — I491 Atrial premature depolarization: Secondary | ICD-10-CM | POA: Diagnosis not present

## 2023-01-03 DIAGNOSIS — I493 Ventricular premature depolarization: Secondary | ICD-10-CM | POA: Diagnosis not present

## 2023-01-03 DIAGNOSIS — I4719 Other supraventricular tachycardia: Secondary | ICD-10-CM | POA: Diagnosis not present

## 2023-01-03 NOTE — Telephone Encounter (Signed)
Patient says middle finger on right is swollen and painful to move she has tried soaking it no helping much and hurts to bend

## 2023-01-03 NOTE — Telephone Encounter (Signed)
Did pt report that she have any injury/trauma? Otherwise she should continue to do what she has been doing, if it begins to get larger and the swelling spreads she should seek emergent care. She has an appt w/Dr. Linford Arnold on Friday.

## 2023-01-04 NOTE — Telephone Encounter (Signed)
Please call patient does she need to be seen?  Is it doing better with her conservative soaks etc.

## 2023-01-05 ENCOUNTER — Ambulatory Visit (INDEPENDENT_AMBULATORY_CARE_PROVIDER_SITE_OTHER): Payer: Medicare HMO | Admitting: Family Medicine

## 2023-01-05 ENCOUNTER — Encounter: Payer: Self-pay | Admitting: Family Medicine

## 2023-01-05 VITALS — BP 145/86 | HR 112 | Ht 62.0 in | Wt 235.3 lb

## 2023-01-05 DIAGNOSIS — M25441 Effusion, right hand: Secondary | ICD-10-CM | POA: Diagnosis not present

## 2023-01-05 DIAGNOSIS — I491 Atrial premature depolarization: Secondary | ICD-10-CM | POA: Diagnosis not present

## 2023-01-05 DIAGNOSIS — L853 Xerosis cutis: Secondary | ICD-10-CM

## 2023-01-05 DIAGNOSIS — J3489 Other specified disorders of nose and nasal sinuses: Secondary | ICD-10-CM | POA: Diagnosis not present

## 2023-01-05 DIAGNOSIS — T2122XA Burn of second degree of abdominal wall, initial encounter: Secondary | ICD-10-CM | POA: Diagnosis not present

## 2023-01-05 DIAGNOSIS — E118 Type 2 diabetes mellitus with unspecified complications: Secondary | ICD-10-CM | POA: Diagnosis not present

## 2023-01-05 MED ORDER — BETAMETHASONE VALERATE 0.1 % EX OINT
1.0000 | TOPICAL_OINTMENT | Freq: Every day | CUTANEOUS | 0 refills | Status: DC
Start: 2023-01-05 — End: 2023-03-27

## 2023-01-05 NOTE — Assessment & Plan Note (Signed)
Will switch to topical steroid.
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
## 2023-01-05 NOTE — Assessment & Plan Note (Signed)
Last A1c improved so we will continue to work on healthy food choices and increasing activity level.

## 2023-01-06 ENCOUNTER — Emergency Department (HOSPITAL_BASED_OUTPATIENT_CLINIC_OR_DEPARTMENT_OTHER)
Admission: EM | Admit: 2023-01-06 | Discharge: 2023-01-06 | Disposition: A | Discharge status: Home / Self Care | Payer: Medicare HMO | Attending: Emergency Medicine | Admitting: Emergency Medicine

## 2023-01-06 ENCOUNTER — Other Ambulatory Visit: Payer: Self-pay

## 2023-01-06 ENCOUNTER — Emergency Department (HOSPITAL_COMMUNITY): Payer: Medicare HMO

## 2023-01-06 ENCOUNTER — Emergency Department (HOSPITAL_BASED_OUTPATIENT_CLINIC_OR_DEPARTMENT_OTHER): Payer: Medicare HMO

## 2023-01-06 ENCOUNTER — Encounter (HOSPITAL_BASED_OUTPATIENT_CLINIC_OR_DEPARTMENT_OTHER): Payer: Self-pay

## 2023-01-06 DIAGNOSIS — G4733 Obstructive sleep apnea (adult) (pediatric): Secondary | ICD-10-CM | POA: Insufficient documentation

## 2023-01-06 DIAGNOSIS — R0789 Other chest pain: Secondary | ICD-10-CM | POA: Diagnosis not present

## 2023-01-06 DIAGNOSIS — J45909 Unspecified asthma, uncomplicated: Secondary | ICD-10-CM | POA: Insufficient documentation

## 2023-01-06 DIAGNOSIS — R0600 Dyspnea, unspecified: Secondary | ICD-10-CM | POA: Diagnosis not present

## 2023-01-06 DIAGNOSIS — M79604 Pain in right leg: Secondary | ICD-10-CM | POA: Insufficient documentation

## 2023-01-06 DIAGNOSIS — Z8541 Personal history of malignant neoplasm of cervix uteri: Secondary | ICD-10-CM | POA: Diagnosis not present

## 2023-01-06 DIAGNOSIS — R0602 Shortness of breath: Secondary | ICD-10-CM | POA: Diagnosis not present

## 2023-01-06 DIAGNOSIS — I2699 Other pulmonary embolism without acute cor pulmonale: Secondary | ICD-10-CM | POA: Diagnosis not present

## 2023-01-06 DIAGNOSIS — E119 Type 2 diabetes mellitus without complications: Secondary | ICD-10-CM | POA: Insufficient documentation

## 2023-01-06 DIAGNOSIS — I10 Essential (primary) hypertension: Secondary | ICD-10-CM | POA: Insufficient documentation

## 2023-01-06 DIAGNOSIS — M79661 Pain in right lower leg: Secondary | ICD-10-CM | POA: Diagnosis not present

## 2023-01-06 LAB — CBC WITH DIFFERENTIAL/PLATELET
Abs Immature Granulocytes: 0.02 10*3/uL (ref 0.00–0.07)
Basophils Absolute: 0 10*3/uL (ref 0.0–0.1)
Basophils Relative: 1 %
Eosinophils Absolute: 0.2 10*3/uL (ref 0.0–0.5)
Eosinophils Relative: 3 %
HCT: 41 % (ref 36.0–46.0)
Hemoglobin: 13.7 g/dL (ref 12.0–15.0)
Immature Granulocytes: 0 %
Lymphocytes Relative: 27 %
Lymphs Abs: 1.5 10*3/uL (ref 0.7–4.0)
MCH: 29.8 pg (ref 26.0–34.0)
MCHC: 33.4 g/dL (ref 30.0–36.0)
MCV: 89.3 fL (ref 80.0–100.0)
Monocytes Absolute: 0.5 10*3/uL (ref 0.1–1.0)
Monocytes Relative: 9 %
Neutro Abs: 3.5 10*3/uL (ref 1.7–7.7)
Neutrophils Relative %: 60 %
Platelets: 174 10*3/uL (ref 150–400)
RBC: 4.59 MIL/uL (ref 3.87–5.11)
RDW: 14.1 % (ref 11.5–15.5)
WBC: 5.7 10*3/uL (ref 4.0–10.5)
nRBC: 0 % (ref 0.0–0.2)

## 2023-01-06 LAB — COMPREHENSIVE METABOLIC PANEL
ALT: 28 U/L (ref 0–44)
AST: 21 U/L (ref 15–41)
Albumin: 3.6 g/dL (ref 3.5–5.0)
Alkaline Phosphatase: 68 U/L (ref 38–126)
Anion gap: 10 (ref 5–15)
BUN: 14 mg/dL (ref 6–20)
CO2: 22 mmol/L (ref 22–32)
Calcium: 8.3 mg/dL — ABNORMAL LOW (ref 8.9–10.3)
Chloride: 106 mmol/L (ref 98–111)
Creatinine, Ser: 0.68 mg/dL (ref 0.44–1.00)
GFR, Estimated: >60 mL/min (ref >60)
Glucose, Bld: 150 mg/dL — ABNORMAL HIGH (ref 70–99)
Potassium: 3.3 mmol/L — ABNORMAL LOW (ref 3.5–5.1)
Sodium: 138 mmol/L (ref 135–145)
Total Bilirubin: 0.6 mg/dL (ref 0.3–1.2)
Total Protein: 6.8 g/dL (ref 6.5–8.1)

## 2023-01-06 LAB — TROPONIN I (HIGH SENSITIVITY)
Troponin I (High Sensitivity): 4 ng/L (ref <18)
Troponin I (High Sensitivity): 4 ng/L (ref <18)

## 2023-01-06 LAB — D-DIMER, QUANTITATIVE: D-Dimer, Quant: 0.59 ug/mL-FEU — ABNORMAL HIGH (ref 0.00–0.50)

## 2023-01-06 LAB — BRAIN NATRIURETIC PEPTIDE: B Natriuretic Peptide: 9.3 pg/mL (ref 0.0–100.0)

## 2023-01-06 LAB — CBG MONITORING, ED: Glucose-Capillary: 87 mg/dL (ref 70–99)

## 2023-01-06 MED ORDER — TECHNETIUM TO 99M ALBUMIN AGGREGATED
4.3000 | Freq: Once | INTRAVENOUS | Status: AC | PRN
  Administered 2023-01-06: 4.3 via INTRAVENOUS
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
## 2023-01-06 NOTE — ED Notes (Signed)
Secretary to call transport and see if we can get patient transferred back to [facility].

## 2023-01-06 NOTE — ED Notes (Signed)
Spoke with [facility] regarding [facility] ED transfer for V/Q Scan

## 2023-01-06 NOTE — ED Triage Notes (Signed)
Patient stated she has some "knots" on her right upper leg that hurt. She stated she had a cardiac cath last month and they went in through her right groin for same.

## 2023-01-06 NOTE — ED Provider Notes (Signed)
Patient sent from outside facility for VQ scan.  She has had "lungs feeling like balloons" with intermittent difficulty breathing and pleuritic pain for several days.  Present "knots" to right thigh which appear to be consistent with lipomas on ultrasound.  Neurovascular intact in her right leg with intact pulses and no surrounding hematoma.  Has questionable CT contrast allergy with reaction to steroids therefore VQ scan was decided upon by previous team.  VQ scan is negative for pulmonary embolism.  Troponin negative x 2.  Patient appears stable for discharge per plan by previous team.  BP (!) 149/87 (BP Location: Right Arm)   Pulse 100   Temp 98.8 F (37.1 C)   Resp 17   Ht 5\' 2"  (1.575 m)   Wt 106 kg   LMP 06/25/2013   SpO2 99%   BMI 42.74 kg/m     Glynn Octave, MD 01/06/23 1433

## 2023-01-06 NOTE — Discharge Instructions (Signed)
Your testing is negative for blood clot in the lung.  No evidence of heart attack.  Follow-up with your primary doctor.  Return to the ED with new or worsening symptoms

## 2023-01-08 DIAGNOSIS — T310 Burns involving less than 10% of body surface: Secondary | ICD-10-CM | POA: Diagnosis not present

## 2023-01-08 DIAGNOSIS — T2121XA Burn of second degree of chest wall, initial encounter: Secondary | ICD-10-CM | POA: Diagnosis not present

## 2023-01-08 DIAGNOSIS — T2122XA Burn of second degree of abdominal wall, initial encounter: Secondary | ICD-10-CM | POA: Diagnosis not present

## 2023-01-09 ENCOUNTER — Telehealth: Payer: Self-pay | Admitting: *Deleted

## 2023-01-09 DIAGNOSIS — R0789 Other chest pain: Secondary | ICD-10-CM | POA: Diagnosis not present

## 2023-01-09 DIAGNOSIS — R079 Chest pain, unspecified: Secondary | ICD-10-CM | POA: Diagnosis not present

## 2023-01-09 DIAGNOSIS — R000 Tachycardia, unspecified: Secondary | ICD-10-CM | POA: Diagnosis not present

## 2023-01-09 NOTE — Telephone Encounter (Signed)
-----   Message from Doylene Bode, NP sent at 01/09/2023 11:13 AM EDT ----- Patient called this am and wants to cancel her procedure for pagets in July. Marijean Niemann, you can let her know I have cancelled this per her request but we strongly advise she keep her upcoming appt with Dr. Alvester Morin to discuss further. Waiting to September is not recommended.

## 2023-01-09 NOTE — Telephone Encounter (Signed)
Spoke with Ms. Iwasaki and relayed message from Warner Mccreedy, NP. Pt is keeping her appointment with Dr. Alvester Morin on June 10th, and still wants to push out her surgery until early September. Pt states, "I am dealing with a lot of heart issues right now", and "I want to see if the new medication they put me on will help with this tachycardia"   Pt advised to discuss her surgery concerns with Dr. Alvester Morin at her June 10th. Appointment. Pt agreed and doesn't plan on canceling her June 10th. Appointment, she reiterated that she doesn't want to have Surgery in July until her heart issues have been taken care of. Patient was advised that waiting until September is not recommended.  Pt has no further questions at this time.

## 2023-01-10 DIAGNOSIS — Z87891 Personal history of nicotine dependence: Secondary | ICD-10-CM | POA: Diagnosis not present

## 2023-01-10 DIAGNOSIS — R064 Hyperventilation: Secondary | ICD-10-CM | POA: Diagnosis not present

## 2023-01-10 DIAGNOSIS — R000 Tachycardia, unspecified: Secondary | ICD-10-CM | POA: Diagnosis not present

## 2023-01-10 DIAGNOSIS — R0789 Other chest pain: Secondary | ICD-10-CM | POA: Diagnosis not present

## 2023-01-10 DIAGNOSIS — Z79899 Other long term (current) drug therapy: Secondary | ICD-10-CM | POA: Diagnosis not present

## 2023-01-10 DIAGNOSIS — R002 Palpitations: Secondary | ICD-10-CM | POA: Diagnosis not present

## 2023-01-10 DIAGNOSIS — R1084 Generalized abdominal pain: Secondary | ICD-10-CM | POA: Diagnosis not present

## 2023-01-11 ENCOUNTER — Encounter: Payer: Self-pay | Admitting: Internal Medicine

## 2023-01-11 ENCOUNTER — Ambulatory Visit (INDEPENDENT_AMBULATORY_CARE_PROVIDER_SITE_OTHER): Payer: Medicare HMO | Admitting: Internal Medicine

## 2023-01-11 ENCOUNTER — Telehealth: Payer: Self-pay | Admitting: Family Medicine

## 2023-01-11 VITALS — BP 150/88 | HR 108 | Temp 97.9°F | Resp 20

## 2023-01-11 DIAGNOSIS — R490 Dysphonia: Secondary | ICD-10-CM | POA: Diagnosis not present

## 2023-01-11 DIAGNOSIS — R030 Elevated blood-pressure reading, without diagnosis of hypertension: Secondary | ICD-10-CM | POA: Diagnosis not present

## 2023-01-11 DIAGNOSIS — J383 Other diseases of vocal cords: Secondary | ICD-10-CM

## 2023-01-11 DIAGNOSIS — K219 Gastro-esophageal reflux disease without esophagitis: Secondary | ICD-10-CM

## 2023-01-11 DIAGNOSIS — R002 Palpitations: Secondary | ICD-10-CM

## 2023-01-11 DIAGNOSIS — J4530 Mild persistent asthma, uncomplicated: Secondary | ICD-10-CM

## 2023-01-11 DIAGNOSIS — J310 Chronic rhinitis: Secondary | ICD-10-CM | POA: Diagnosis not present

## 2023-01-11 DIAGNOSIS — R0602 Shortness of breath: Secondary | ICD-10-CM | POA: Diagnosis not present

## 2023-01-11 DIAGNOSIS — R079 Chest pain, unspecified: Secondary | ICD-10-CM | POA: Diagnosis not present

## 2023-01-11 MED ORDER — AZELASTINE HCL 0.1 % NA SOLN: 1.0000 | Freq: Every day | NASAL | 5 refills | Status: DC | PRN

## 2023-01-11 MED ORDER — EPINEPHRINE 0.3 MG/0.3ML IJ SOAJ: 0.3000 mg | INTRAMUSCULAR | 2 refills | Status: DC | PRN
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
## 2023-01-11 NOTE — Patient Instructions (Addendum)
Return in about 6 months (around 07/14/2023).  Jennifer [name]  Chronic Cough, Shortness of breath Daily controller medication(s): none; does not tolerate any inhaled steroids  May use levoalbuterol rescue inhaler 1-2 puffs or nebulizer every 4 to 6 hours as needed for shortness of breath, chest tightness, coughing, and wheezing. Monitor frequency of use.  Use if feeling air hard to breath out or lungs feel like balloons Breathing control goals:  Full participation in all desired activities (may need albuterol before activity) Albuterol use two times or less a week on average (not counting use with activity) Cough interfering with sleep two times or less a month Oral steroids no more than once a year No hospitalizations   Reflux Continue dietary and lifestyle modifications. Continue the regimen as prescribed by your gastroenterologist.  Keep a diary to track symptoms and food exposures  Chronic rhinitis Trial of azelastine 1 spray each nostril daily as needed for congestion. Continue flunisolide nasal spray 2 sprays in each nostril once a day a day as needed for stuffy nose.  In the right nostril, point the applicator out toward the right ear. In the left nostril, point the applicator out toward the left ear Continue to perform nasal saline rinses 1-3 times a day as needed to help clean sinus tract - neil med sinus rinses DO NOT USE TAP WATER, can use distilled water or tap water that you boil for several minutes and then let cool prior to use.  You can also try NeilMed Sinus Rinse Premixed Refill Packets With Xylitol and Sinus Rinse Extra Strength Hypertonic Packets. Can use salt water gargles to help with phlegm in throat.   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 Thank you for allowing me to participate in your care.  Tonny Bollman, MD [facility]

## 2023-01-11 NOTE — Telephone Encounter (Signed)
Ok to place referral.

## 2023-01-11 NOTE — Telephone Encounter (Signed)
Patient called she is requesting a referral for [facility] [place] [phone] states she having a hard time getting an appointment please advise

## 2023-01-12 ENCOUNTER — Telehealth: Payer: Self-pay | Admitting: Family Medicine

## 2023-01-12 ENCOUNTER — Other Ambulatory Visit: Payer: Self-pay

## 2023-01-12 ENCOUNTER — Telehealth: Payer: Self-pay

## 2023-01-12 DIAGNOSIS — I479 Paroxysmal tachycardia, unspecified: Secondary | ICD-10-CM | POA: Diagnosis not present

## 2023-01-12 DIAGNOSIS — R000 Tachycardia, unspecified: Secondary | ICD-10-CM | POA: Diagnosis not present

## 2023-01-12 DIAGNOSIS — R002 Palpitations: Secondary | ICD-10-CM | POA: Diagnosis not present

## 2023-01-12 MED ORDER — FAMOTIDINE 40 MG PO TABS: 40.0000 mg | ORAL_TABLET | Freq: Two times a day (BID) | ORAL | 2 refills | Status: DC

## 2023-01-12 NOTE — Telephone Encounter (Signed)
Spoke with patient-  states she is wanting to change cardiologist to [facility] heart and vascular. She is very frustrated with current cardiologist -states she has attempted to call them and left multiple messages this entire week with out a return call regarding her tachycardia-she was told to restart metoprolol 25mg  ( has take for the past 2 days ) until can receive the return call and she has never received this. The doctor also states that  he or his nurse would call her with results of test recently done  but has not heard from anyone there regarding this either. She states she already has a scheduled appt with [facility] and vascular for July but the referral is required and was hoping Dr. Linford Arnold would call them and have them see her sooner. She has seen her specialist today for  Asthma and they told her that her SOB was not caused by her lungs but her heart- she is very anxious regarding this all.

## 2023-01-12 NOTE — Addendum Note (Signed)
Addended by: Nani Gasser D on: 01/12/2023 05:04 PM   Modules accepted: Orders

## 2023-01-12 NOTE — Telephone Encounter (Signed)
Sorry, lets call patient and find out what she is specifically wanting them to address.  She just had an ablation done with another cardiologist office I am not sure if she is wanting something separate?

## 2023-01-12 NOTE — Telephone Encounter (Signed)
Patient called for a refill on med for her acid reflux. Pharmacy:[facility]

## 2023-01-12 NOTE — Telephone Encounter (Signed)
Patient requesting rx rf of famotidine  for acid reflux but this med not showing in current med list.

## 2023-01-12 NOTE — Telephone Encounter (Signed)
Meds ordered this encounter  Medications   famotidine (PEPCID) 40 MG tablet    Sig: Take 1 tablet (40 mg total) by mouth 2 (two) times daily.    Dispense:  60 tablet    Refill:  2

## 2023-01-12 NOTE — Telephone Encounter (Signed)
Message sent to patient via Mychart that script has been sent.

## 2023-01-12 NOTE — Telephone Encounter (Signed)
Attempted call to patient. Left vocie mail message requesting a return call.

## 2023-01-13 DIAGNOSIS — R002 Palpitations: Secondary | ICD-10-CM | POA: Diagnosis not present

## 2023-01-14 DIAGNOSIS — G35 Multiple sclerosis: Secondary | ICD-10-CM | POA: Diagnosis not present

## 2023-01-14 DIAGNOSIS — R1032 Left lower quadrant pain: Secondary | ICD-10-CM | POA: Diagnosis not present

## 2023-01-14 DIAGNOSIS — R109 Unspecified abdominal pain: Secondary | ICD-10-CM | POA: Diagnosis not present

## 2023-01-14 DIAGNOSIS — Z87891 Personal history of nicotine dependence: Secondary | ICD-10-CM | POA: Diagnosis not present

## 2023-01-14 DIAGNOSIS — K5730 Diverticulosis of large intestine without perforation or abscess without bleeding: Secondary | ICD-10-CM | POA: Diagnosis not present

## 2023-01-15 DIAGNOSIS — I471 Supraventricular tachycardia, unspecified: Secondary | ICD-10-CM | POA: Diagnosis not present

## 2023-01-15 DIAGNOSIS — I491 Atrial premature depolarization: Secondary | ICD-10-CM | POA: Diagnosis not present

## 2023-01-15 DIAGNOSIS — R000 Tachycardia, unspecified: Secondary | ICD-10-CM | POA: Diagnosis not present

## 2023-01-15 DIAGNOSIS — I10 Essential (primary) hypertension: Secondary | ICD-10-CM | POA: Diagnosis not present

## 2023-01-15 DIAGNOSIS — I493 Ventricular premature depolarization: Secondary | ICD-10-CM | POA: Diagnosis not present

## 2023-01-15 DIAGNOSIS — I4719 Other supraventricular tachycardia: Secondary | ICD-10-CM | POA: Diagnosis not present

## 2023-01-15 DIAGNOSIS — I517 Cardiomegaly: Secondary | ICD-10-CM | POA: Diagnosis not present

## 2023-01-18 DIAGNOSIS — R6889 Other general symptoms and signs: Secondary | ICD-10-CM | POA: Diagnosis not present

## 2023-01-18 DIAGNOSIS — R000 Tachycardia, unspecified: Secondary | ICD-10-CM | POA: Diagnosis not present

## 2023-01-18 DIAGNOSIS — G909 Disorder of the autonomic nervous system, unspecified: Secondary | ICD-10-CM | POA: Diagnosis not present

## 2023-01-18 DIAGNOSIS — E782 Mixed hyperlipidemia: Secondary | ICD-10-CM | POA: Diagnosis not present

## 2023-01-18 DIAGNOSIS — I4719 Other supraventricular tachycardia: Secondary | ICD-10-CM | POA: Diagnosis not present

## 2023-01-18 DIAGNOSIS — I10 Essential (primary) hypertension: Secondary | ICD-10-CM | POA: Diagnosis not present

## 2023-01-18 DIAGNOSIS — E611 Iron deficiency: Secondary | ICD-10-CM | POA: Diagnosis not present

## 2023-01-18 DIAGNOSIS — R0789 Other chest pain: Secondary | ICD-10-CM | POA: Diagnosis not present

## 2023-01-18 DIAGNOSIS — I491 Atrial premature depolarization: Secondary | ICD-10-CM | POA: Diagnosis not present

## 2023-01-18 DIAGNOSIS — E876 Hypokalemia: Secondary | ICD-10-CM | POA: Diagnosis not present

## 2023-01-18 DIAGNOSIS — R002 Palpitations: Secondary | ICD-10-CM | POA: Diagnosis not present

## 2023-01-18 DIAGNOSIS — I471 Supraventricular tachycardia, unspecified: Secondary | ICD-10-CM | POA: Diagnosis not present

## 2023-01-18 DIAGNOSIS — E531 Pyridoxine deficiency: Secondary | ICD-10-CM | POA: Diagnosis not present

## 2023-01-19 ENCOUNTER — Encounter: Payer: Self-pay | Admitting: Family Medicine

## 2023-01-19 ENCOUNTER — Ambulatory Visit (INDEPENDENT_AMBULATORY_CARE_PROVIDER_SITE_OTHER): Payer: Medicare HMO | Admitting: Family Medicine

## 2023-01-19 VITALS — BP 141/81 | HR 97 | Ht 62.0 in | Wt 238.0 lb

## 2023-01-19 DIAGNOSIS — R000 Tachycardia, unspecified: Secondary | ICD-10-CM | POA: Diagnosis not present

## 2023-01-19 DIAGNOSIS — R0789 Other chest pain: Secondary | ICD-10-CM

## 2023-01-19 DIAGNOSIS — I10 Essential (primary) hypertension: Secondary | ICD-10-CM | POA: Diagnosis not present

## 2023-01-19 DIAGNOSIS — F331 Major depressive disorder, recurrent, moderate: Secondary | ICD-10-CM | POA: Diagnosis not present

## 2023-01-19 DIAGNOSIS — Z79899 Other long term (current) drug therapy: Secondary | ICD-10-CM | POA: Diagnosis not present

## 2023-01-19 DIAGNOSIS — R002 Palpitations: Secondary | ICD-10-CM | POA: Diagnosis not present

## 2023-01-19 DIAGNOSIS — E118 Type 2 diabetes mellitus with unspecified complications: Secondary | ICD-10-CM

## 2023-01-19 DIAGNOSIS — E1169 Type 2 diabetes mellitus with other specified complication: Secondary | ICD-10-CM | POA: Diagnosis not present

## 2023-01-19 DIAGNOSIS — M542 Cervicalgia: Secondary | ICD-10-CM

## 2023-01-19 IMAGING — CT CT ABD-PELV W/O CM
2 of 4 series · 16 of 46 positions shown, 18 images · non-contrast
Comparison: Numerous prior CTs Abdomen and Pelvis, including [facility]
[REDACTED] [HOSPITAL] [HOSPITAL] CT Abdomen and
Pelvis 08/16/2021.

CLINICAL DATA: 56-year-old female with lower abdominal pain,
constant since yesterday. Nausea.



[Series 2: axial st · axial · 0.98mm/px · z∈[-490,-45]mm · 13 of 99 slices shown, 15 images]
[im 5/99  soft-tissue]
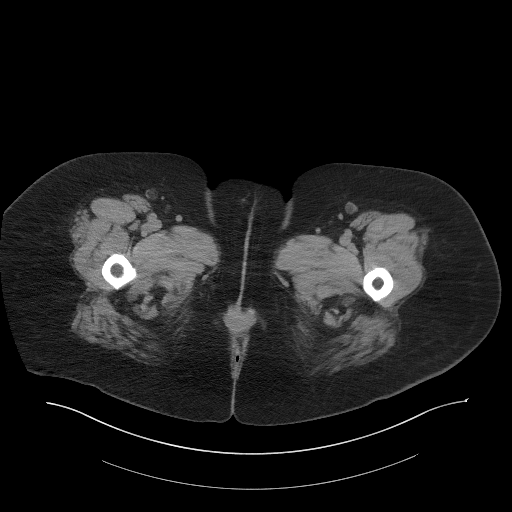
[im 5/99  bone]
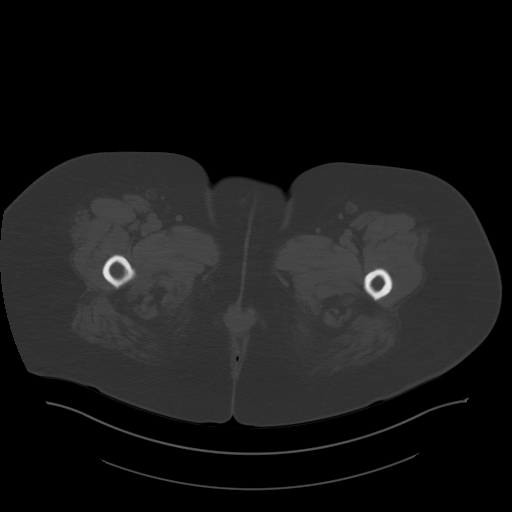
[im 13/99  soft-tissue]
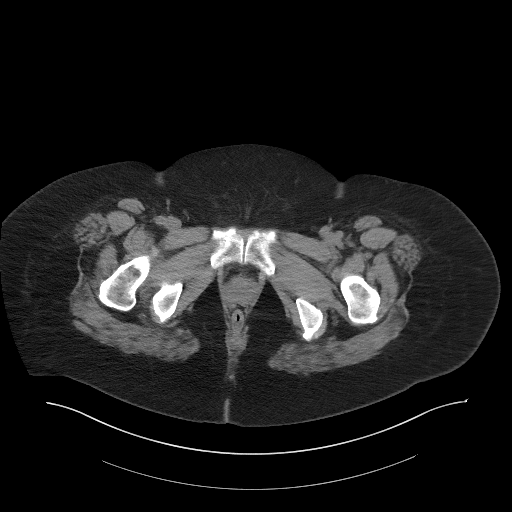
[im 22/99  soft-tissue]
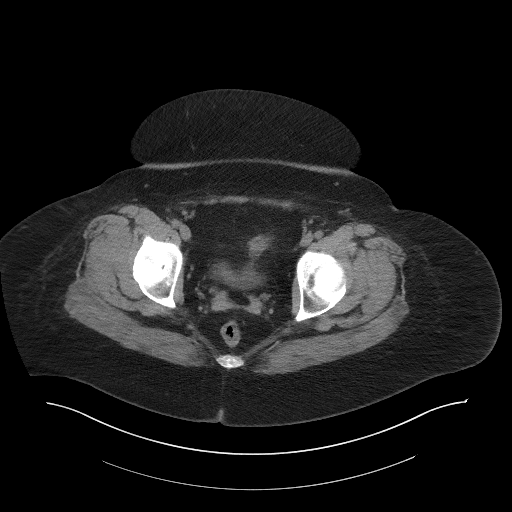
[im 26/99  soft-tissue]
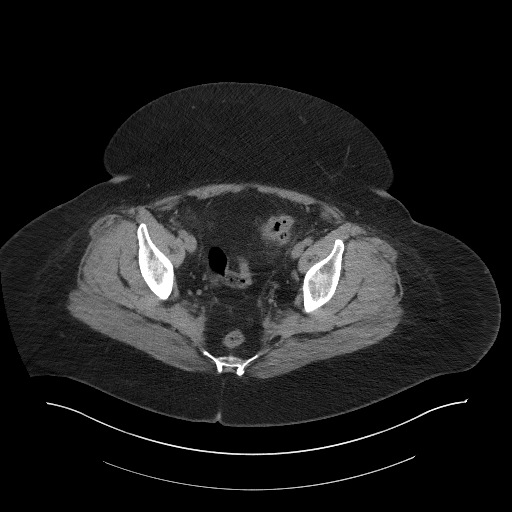
[im 35/99  soft-tissue]
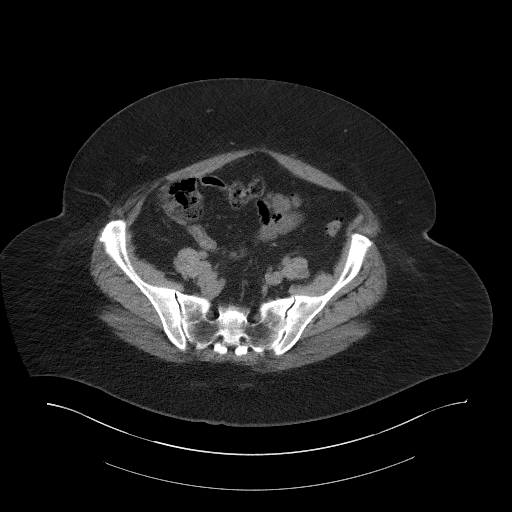
[im 43/99  soft-tissue]
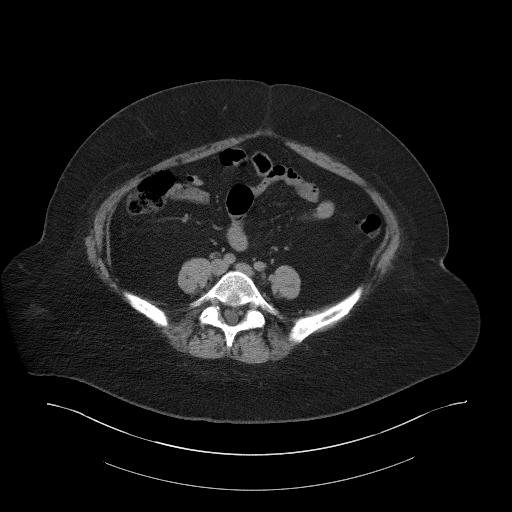
[im 52/99  soft-tissue]
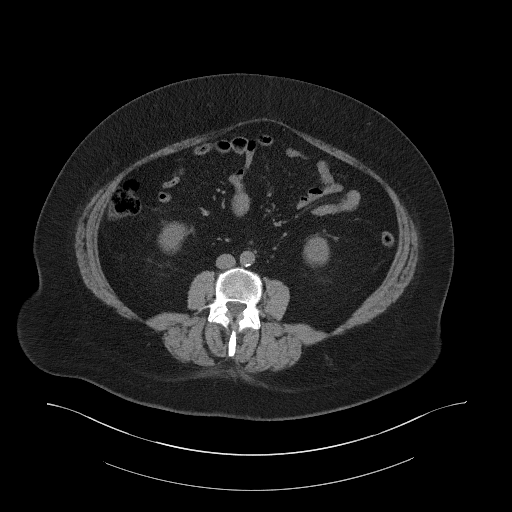
[im 56/99  soft-tissue]
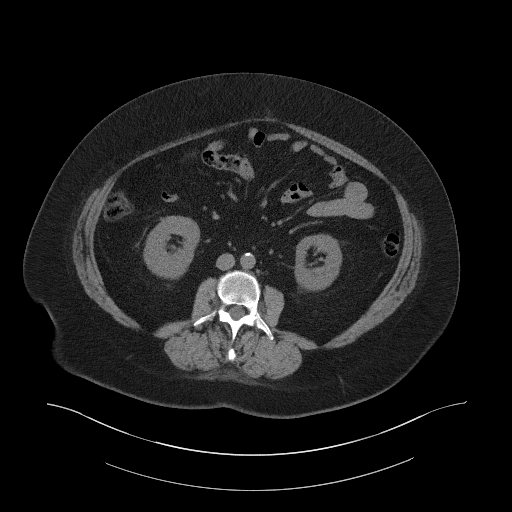
[im 64/99  soft-tissue]
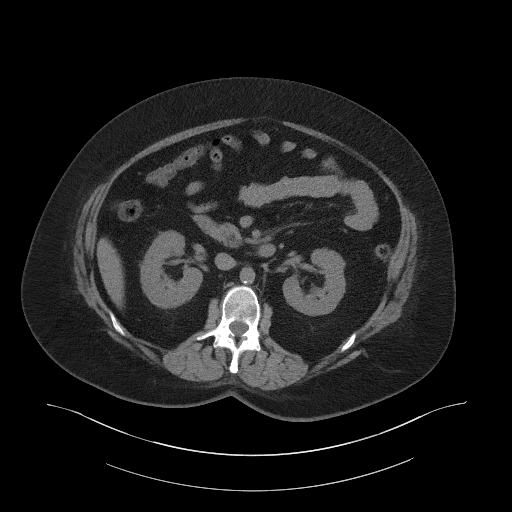
[im 64/99  bone]
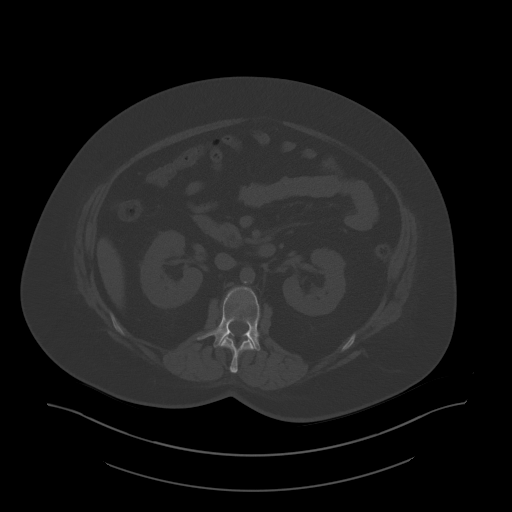
[im 73/99  soft-tissue]
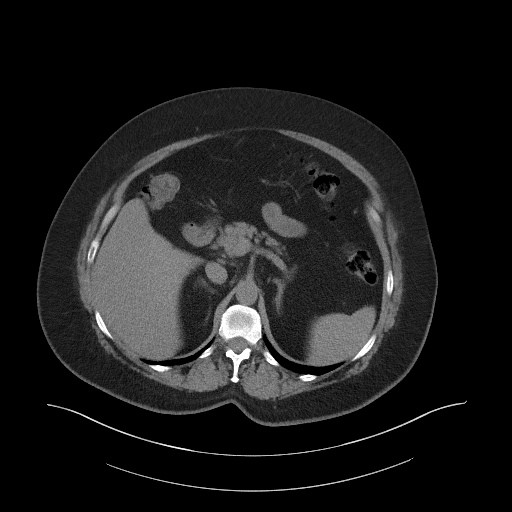
[im 77/99  soft-tissue]
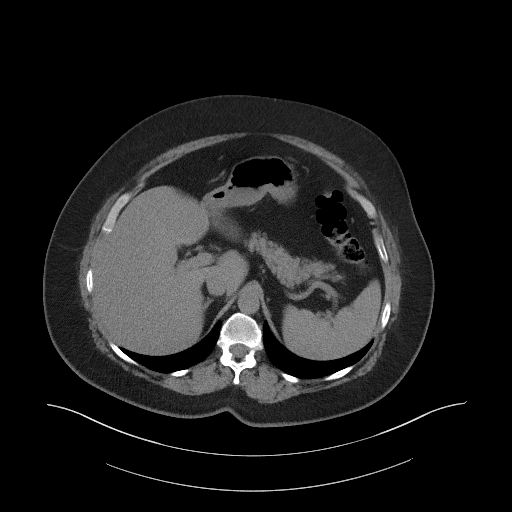
[im 86/99  soft-tissue]
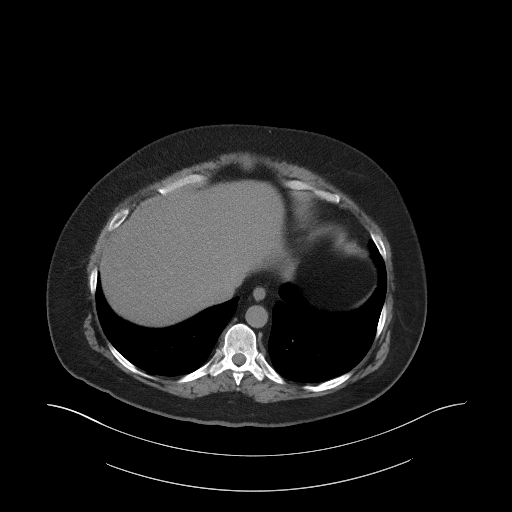
[im 94/99  soft-tissue]
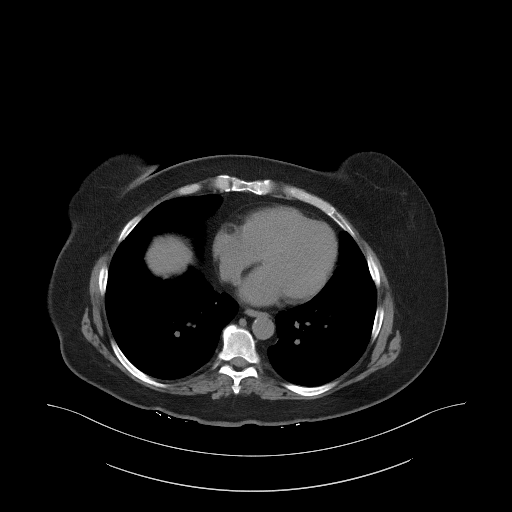

[Series 5: coronal st · coronal · 0.90mm/px · 3 of 115 slices shown]
[im 39/115  soft-tissue]
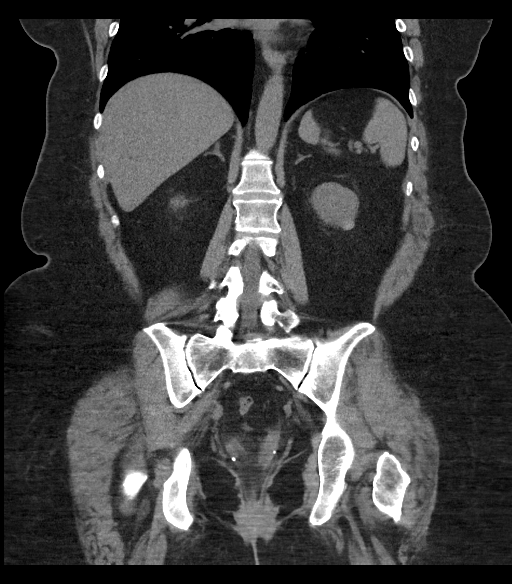
[im 51/115  soft-tissue]
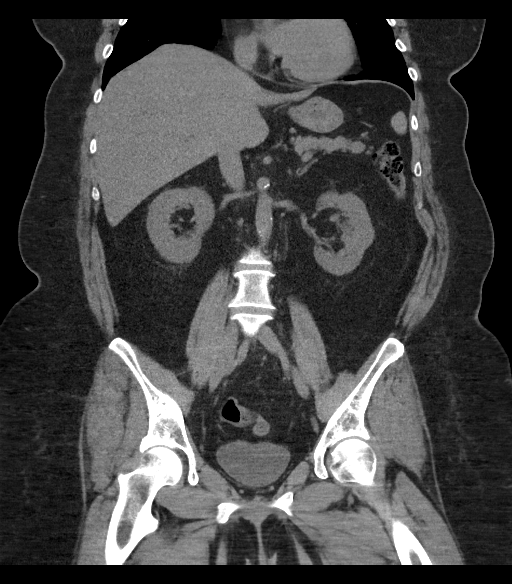
[im 64/115  soft-tissue]
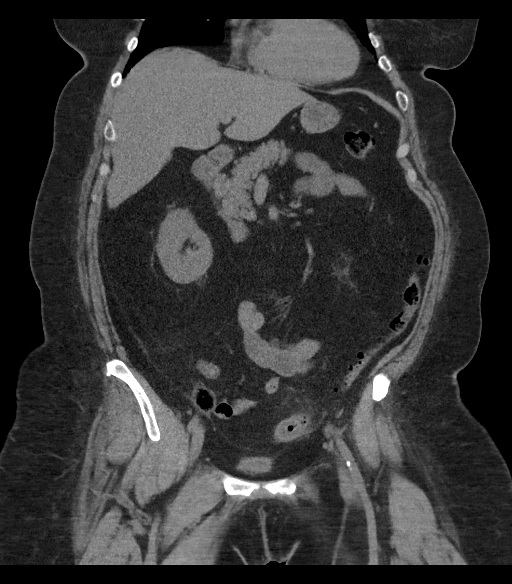

[16 of 46 positions shown; findings below may reference images not displayed]

FINDINGS: Lower chest: Negative.

Hepatobiliary: Chronically absent gallbladder. Hepatic steatosis is
less apparent today.

Pancreas: Negative noncontrast pancreas.

Spleen: Negative.

Adrenals/Urinary Tract: Stable since 0616 and negative with
occasional tiny benign renal cysts. No nephrolithiasis or
obstructive uropathy. Occasional pelvic phleboliths.

Stomach/Bowel: Negative rectum. Diverticulosis in the descending and
sigmoid colon with short segment of acute inflammation in the
proximal sigmoid on series 2, image 75. 3.5 cm segment of sigmoid
wall thickening and mild mesenteric stranding (coronal image 64). No
extraluminal gas or fluid.

No upstream dilated large bowel. Negative transverse and right
colon, with diminutive or absent appendix. No dilated small bowel.
Negative stomach and duodenum.

Vascular/Lymphatic: Mild to moderate Aortoiliac calcified
atherosclerosis. Normal caliber abdominal aorta. No lymphadenopathy
identified.

Reproductive: Surgically absent uterus, diminutive or absent
ovaries.

Other: No pelvic free fluid.

Musculoskeletal: Stable and negative.
IMPRESSION: 1. Proximal sigmoid 3.5 cm segment of acute inflammation compatible
with acute sigmoid diverticulitis. No perforation or complicating
features. Recommend up-to-date colon cancer screening as carcinoma
can sometimes present with a similar appearance.

2. No other acute or inflammatory process in the noncontrast abdomen
or pelvis. Aortic Atherosclerosis (LPCE0-LE6.6).

## 2023-01-19 NOTE — Assessment & Plan Note (Signed)
Sugar mildly elevated today.  Though it does look better than the last time she was here.  I do think it would be reasonable for her to try half a tab midday of the metoprolol.  And if that is not still adequately controlling her symptoms then bumping up to 50 mg twice a day.

## 2023-01-19 NOTE — Assessment & Plan Note (Signed)
I do think she would be a good candidate for medication to help with her depression.  She is engaged in therapy/counseling.  We discussed the possibility of GeneSight to do some testing to better find out which medications might work well for her.

## 2023-01-19 NOTE — Assessment & Plan Note (Signed)
Diabetes-we did discuss continuing to work on lifestyle changes I am hoping that her A1c is improved by the time we recheck in July so we might be able to hold off on medication which is her goal but stressed the importance of really paying attention to her to her diet to be successful without medication.

## 2023-01-19 NOTE — Progress Notes (Signed)
Established Patient Office Visit  Subjective   Patient ID: Jennifer [name], female    DOB: 01/09/1966  Age: 57 y.o. MRN: [id number]  No chief complaint on file.   HPI  Went to plop down on her couch and felt a sudden sensation on her chest.  Her heart pounded forcefully, but not fast, about 10 times in a row.  She experienced a shooting pain up the left side of her neck when this occurred.  She says that fact it still feels a little bit sore right now.  She is currently on metoprolol 25 mg twice a day.  She had just seeing cardiology earlier this week before the episode and they had suggested going up to 50 mg twice a day.  She thinks she would rather try taking a half a tab midday to see if that better controls her symptoms since they seem to be more prominent later in the day as the medication seems to be wearing off.  Since her heart ablation they have she has not experienced as much of the quivering and flip-flop rapid sensation.  So she feels like the PACs are much improved.  She is also been having some pain over her upper anterior neck bilaterally that started a couple of days ago.  It is painful if she takes a deep breath and it is painful if she swallows.  No fevers or chills.  She does feel a little bit swollen in her neck as well.  Diabetes-last A1c was 6.4 she is due in July for repeat A1c she is still been very hesitant to get on medication because of the size of the pills with her difficulty swallowing.  She has been trying to work on her diet she has been eating her largest meal more often in the midday with a lighter snack or meal in the evenings.  Recent iron levels look great.   She still working with her therapist who is suggested the possibility of medication for her mood.  She has tried several medicines years ago probably a couple of decades ago.  Zoloft caused severe headaches, Prozac caused headaches, Paxil caused headaches, Effexor caused a crawly sensation on her  skin.  And she has also tried BuSpar in the past.     ROS    Objective:     BP (!) 141/81   Pulse 97   Ht 5\' 2"  (1.575 m)   Wt 238 lb (108 kg)   LMP 06/25/2013   SpO2 96%   BMI 43.53 kg/m    Physical Exam Constitutional:      Appearance: She is well-developed.  HENT:     Head: Normocephalic and atraumatic.     Right Ear: External ear normal.     Left Ear: External ear normal.     Nose: Nose normal.     Mouth/Throat:     Pharynx: Oropharynx is clear. No oropharyngeal exudate or posterior oropharyngeal erythema.  Eyes:     Conjunctiva/sclera: Conjunctivae normal.     Pupils: Pupils are equal, round, and reactive to light.  Neck:     Thyroid: No thyromegaly.  Cardiovascular:     Rate and Rhythm: Normal rate and regular rhythm.     Heart sounds: Normal heart sounds.  Pulmonary:     Effort: Pulmonary effort is normal.     Breath sounds: Normal breath sounds. No wheezing.  Musculoskeletal:     Cervical back: Neck supple.  Lymphadenopathy:     Cervical: No  cervical adenopathy.  Skin:    General: Skin is warm and dry.  Neurological:     Mental Status: She is alert and oriented to person, place, and time.  Psychiatric:        Mood and Affect: Mood normal.        Behavior: Behavior normal.      No results found for any visits on 01/19/23.    The ASCVD Risk score (Arnett DK, et al., 2019) failed to calculate for the following reasons:   The patient has a prior MI or stroke diagnosis    Assessment & Plan:   Problem List Items Addressed This Visit       Cardiovascular and Mediastinum   Hypertension with intolerance to multiple antihypertensive drugs    Sugar mildly elevated today.  Though it does look better than the last time she was here.  I do think it would be reasonable for her to try half a tab midday of the metoprolol.  And if that is not still adequately controlling her symptoms then bumping up to 50 mg twice a day.        Endocrine   Diabetes  mellitus type 2 with complications (HCC)    Diabetes-we did discuss continuing to work on lifestyle changes I am hoping that her A1c is improved by the time we recheck in July so we might be able to hold off on medication which is her goal but stressed the importance of really paying attention to her to her diet to be successful without medication.        Other   MDD (major depressive disorder), recurrent episode (HCC)    I do think she would be a good candidate for medication to help with her depression.  She is engaged in therapy/counseling.  We discussed the possibility of GeneSight to do some testing to better find out which medications might work well for her.      Chest pain, atypical - Primary   Relevant Orders   EKG 12-Lead   Other Visit Diagnoses     Neck pain       Type 2 diabetes mellitus with other specified complication, without long-term current use of insulin (HCC)           EKG shows rate of 98 bpm, normal sinus rhythm.  No acute ST-T wave changes.  Neck pain-suspect that it may just be some irritation in the throat either from postnasal drip or drainage or dryness.  No sign of infection or strep throat at this point in time.  Pain is symmetric and bilateral she is able to swallow though she does have more chronic swallowing issues that may need to be addressed separately.  Left-sided neck pain that occurred today-no sign of bruits on exam.  Exam itself is reassuring that she did not injure or tear any tissue.  Gave her reassurance.  It should resolve on its own in the next couple of days.  No follow-ups on file.    Nani Gasser, MD

## 2023-01-19 NOTE — Patient Instructions (Signed)
Can take a look at [employer] Protein drinks ( high in protein and low in carbs).   I will have [name] check on GeneSight for you.

## 2023-01-20 DIAGNOSIS — R000 Tachycardia, unspecified: Secondary | ICD-10-CM | POA: Diagnosis not present

## 2023-01-21 DIAGNOSIS — R079 Chest pain, unspecified: Secondary | ICD-10-CM | POA: Diagnosis not present

## 2023-01-21 DIAGNOSIS — R000 Tachycardia, unspecified: Secondary | ICD-10-CM | POA: Diagnosis not present

## 2023-01-22 ENCOUNTER — Inpatient Hospital Stay: Payer: Medicare HMO | Admitting: Psychiatry

## 2023-01-22 DIAGNOSIS — Z87891 Personal history of nicotine dependence: Secondary | ICD-10-CM | POA: Diagnosis not present

## 2023-01-22 DIAGNOSIS — R002 Palpitations: Secondary | ICD-10-CM | POA: Diagnosis not present

## 2023-01-22 DIAGNOSIS — R000 Tachycardia, unspecified: Secondary | ICD-10-CM | POA: Diagnosis not present

## 2023-01-22 DIAGNOSIS — Z90710 Acquired absence of both cervix and uterus: Secondary | ICD-10-CM | POA: Diagnosis not present
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
## 2023-01-24 DIAGNOSIS — Z87891 Personal history of nicotine dependence: Secondary | ICD-10-CM | POA: Diagnosis not present

## 2023-01-24 DIAGNOSIS — M542 Cervicalgia: Secondary | ICD-10-CM | POA: Diagnosis not present

## 2023-01-24 DIAGNOSIS — R079 Chest pain, unspecified: Secondary | ICD-10-CM | POA: Diagnosis not present

## 2023-01-24 DIAGNOSIS — I10 Essential (primary) hypertension: Secondary | ICD-10-CM | POA: Diagnosis not present

## 2023-01-24 DIAGNOSIS — R002 Palpitations: Secondary | ICD-10-CM | POA: Diagnosis not present

## 2023-01-26 DIAGNOSIS — R002 Palpitations: Secondary | ICD-10-CM | POA: Diagnosis not present

## 2023-01-26 DIAGNOSIS — R531 Weakness: Secondary | ICD-10-CM | POA: Diagnosis not present

## 2023-01-26 DIAGNOSIS — I10 Essential (primary) hypertension: Secondary | ICD-10-CM | POA: Diagnosis not present

## 2023-01-26 DIAGNOSIS — Z556 Problems related to health literacy: Secondary | ICD-10-CM | POA: Diagnosis not present

## 2023-01-26 DIAGNOSIS — R079 Chest pain, unspecified: Secondary | ICD-10-CM | POA: Diagnosis not present

## 2023-01-26 DIAGNOSIS — R0789 Other chest pain: Secondary | ICD-10-CM | POA: Diagnosis not present

## 2023-01-26 DIAGNOSIS — M542 Cervicalgia: Secondary | ICD-10-CM | POA: Diagnosis not present

## 2023-01-30 DIAGNOSIS — M79651 Pain in right thigh: Secondary | ICD-10-CM | POA: Diagnosis not present

## 2023-01-30 DIAGNOSIS — I4719 Other supraventricular tachycardia: Secondary | ICD-10-CM | POA: Diagnosis not present

## 2023-01-30 DIAGNOSIS — R002 Palpitations: Secondary | ICD-10-CM | POA: Diagnosis not present

## 2023-01-30 DIAGNOSIS — E782 Mixed hyperlipidemia: Secondary | ICD-10-CM | POA: Diagnosis not present

## 2023-01-30 DIAGNOSIS — I10 Essential (primary) hypertension: Secondary | ICD-10-CM | POA: Diagnosis not present

## 2023-01-30 DIAGNOSIS — E611 Iron deficiency: Secondary | ICD-10-CM | POA: Diagnosis not present

## 2023-01-30 DIAGNOSIS — I471 Supraventricular tachycardia, unspecified: Secondary | ICD-10-CM | POA: Diagnosis not present

## 2023-01-30 DIAGNOSIS — E531 Pyridoxine deficiency: Secondary | ICD-10-CM | POA: Diagnosis not present

## 2023-01-30 DIAGNOSIS — K1379 Other lesions of oral mucosa: Secondary | ICD-10-CM | POA: Diagnosis not present

## 2023-02-01 DIAGNOSIS — R002 Palpitations: Secondary | ICD-10-CM | POA: Diagnosis not present

## 2023-02-01 DIAGNOSIS — Z87891 Personal history of nicotine dependence: Secondary | ICD-10-CM | POA: Diagnosis not present

## 2023-02-01 DIAGNOSIS — I10 Essential (primary) hypertension: Secondary | ICD-10-CM | POA: Diagnosis not present

## 2023-02-01 DIAGNOSIS — I252 Old myocardial infarction: Secondary | ICD-10-CM | POA: Diagnosis not present

## 2023-02-01 DIAGNOSIS — R0602 Shortness of breath: Secondary | ICD-10-CM | POA: Diagnosis not present

## 2023-02-01 DIAGNOSIS — R000 Tachycardia, unspecified: Secondary | ICD-10-CM | POA: Diagnosis not present

## 2023-02-01 DIAGNOSIS — Z20822 Contact with and (suspected) exposure to covid-19: Secondary | ICD-10-CM | POA: Diagnosis not present

## 2023-02-01 DIAGNOSIS — Z8709 Personal history of other diseases of the respiratory system: Secondary | ICD-10-CM | POA: Diagnosis not present

## 2023-02-01 DIAGNOSIS — R079 Chest pain, unspecified: Secondary | ICD-10-CM | POA: Diagnosis not present

## 2023-02-02 ENCOUNTER — Telehealth: Payer: Self-pay | Admitting: Family Medicine

## 2023-02-02 DIAGNOSIS — R000 Tachycardia, unspecified: Secondary | ICD-10-CM | POA: Diagnosis not present

## 2023-02-02 DIAGNOSIS — R0602 Shortness of breath: Secondary | ICD-10-CM | POA: Diagnosis not present

## 2023-02-02 DIAGNOSIS — R079 Chest pain, unspecified: Secondary | ICD-10-CM | POA: Diagnosis not present

## 2023-02-02 DIAGNOSIS — R002 Palpitations: Secondary | ICD-10-CM | POA: Diagnosis not present

## 2023-02-02 NOTE — Telephone Encounter (Signed)
Patient is requesting  a referral for ep heart doctor Please advise

## 2023-02-03 DIAGNOSIS — R000 Tachycardia, unspecified: Secondary | ICD-10-CM | POA: Diagnosis not present

## 2023-02-03 DIAGNOSIS — R079 Chest pain, unspecified: Secondary | ICD-10-CM | POA: Diagnosis not present

## 2023-02-04 DIAGNOSIS — K219 Gastro-esophageal reflux disease without esophagitis: Secondary | ICD-10-CM | POA: Diagnosis not present

## 2023-02-04 DIAGNOSIS — M25512 Pain in left shoulder: Secondary | ICD-10-CM | POA: Diagnosis not present

## 2023-02-04 DIAGNOSIS — G8929 Other chronic pain: Secondary | ICD-10-CM | POA: Diagnosis not present

## 2023-02-04 DIAGNOSIS — F419 Anxiety disorder, unspecified: Secondary | ICD-10-CM | POA: Diagnosis not present

## 2023-02-04 DIAGNOSIS — E119 Type 2 diabetes mellitus without complications: Secondary | ICD-10-CM | POA: Diagnosis not present

## 2023-02-04 DIAGNOSIS — M25511 Pain in right shoulder: Secondary | ICD-10-CM | POA: Diagnosis not present

## 2023-02-04 DIAGNOSIS — R0602 Shortness of breath: Secondary | ICD-10-CM | POA: Diagnosis not present

## 2023-02-04 DIAGNOSIS — J45909 Unspecified asthma, uncomplicated: Secondary | ICD-10-CM | POA: Diagnosis not present

## 2023-02-04 DIAGNOSIS — M5489 Other dorsalgia: Secondary | ICD-10-CM | POA: Diagnosis not present

## 2023-02-05 ENCOUNTER — Encounter: Payer: Self-pay | Admitting: Pharmacist

## 2023-02-05 DIAGNOSIS — T466X5A Adverse effect of antihyperlipidemic and antiarteriosclerotic drugs, initial encounter: Secondary | ICD-10-CM

## 2023-02-05 NOTE — Telephone Encounter (Signed)
Will address during OV tomorrow.

## 2023-02-05 NOTE — Progress Notes (Signed)
[facility]) [facility] Quality Pharmacy Team Statin Quality Measure Assessment  02/05/2023  RICCI PAFF Jan 24, 1966 [id number]  Per review of chart and payor information, patient has a diagnosis of diabetes but is not currently filling a statin prescription.  This places patient into the Statin Use In Patients with Diabetes (SUPD) measure for CMS.    Patient has documented trials of statins with reported myopathy, but no corresponding CPT codes that would exclude patient from SUPD measure. Patient has an upcoming appointment  on 02/06/23.  If deemed therapeutically appropriate, statin therapy could be assessed at the upcoming appointment.  A lower potency statin with alternative dosing could be trialed or a statin exclusion code could be associated with the visit which would remove the patient from the measure.      Component Value Date/Time   CHOL 170 01/24/2018 0000   TRIG 165 (H) 07/30/2020 1002   HDL 38 01/24/2018 0000   CHOLHDL 4.6 11/20/2013 0934   VLDL 30 11/20/2013 0934   LDLCALC 92 01/24/2018 0000   LDLDIRECT 110 (H) 08/19/2012 0000    Please consider ONE of the following recommendations:  Initiate high intensity statin Atorvastatin 40 mg once daily, #90, 3 refills   Rosuvastatin 20 mg once daily, #90, 3 refills    Initiate moderate intensity          statin with reduced frequency if prior          statin intolerance 1x weekly, #13, 3 refills   2x weekly, #26, 3 refills   3x weekly, #39, 3 refills    Code for past statin intolerance or  other exclusions (required annually)  Provider Requirements: Associate code during an office visit or telehealth encounter  Drug Induced Myopathy G72.0   Myopathy, unspecified G72.9   Myositis, unspecified M60.9   Rhabdomyolysis M62.82   Cirrhosis of liver K74.69   Prediabetes R73.03   PCOS E28.2   Plan: Route note to PCP prior to upcoming appointment.  Beecher Mcardle, PharmD, BCACP [place] [facility]  Pharmacist [phone]

## 2023-02-06 ENCOUNTER — Ambulatory Visit: Payer: Medicare HMO | Admitting: Family Medicine

## 2023-02-06 ENCOUNTER — Telehealth: Payer: Self-pay | Admitting: Internal Medicine

## 2023-02-06 ENCOUNTER — Telehealth (INDEPENDENT_AMBULATORY_CARE_PROVIDER_SITE_OTHER): Payer: Medicare HMO | Admitting: Family Medicine

## 2023-02-06 ENCOUNTER — Telehealth: Payer: Self-pay

## 2023-02-06 DIAGNOSIS — F331 Major depressive disorder, recurrent, moderate: Secondary | ICD-10-CM

## 2023-02-06 DIAGNOSIS — R0789 Other chest pain: Secondary | ICD-10-CM | POA: Diagnosis not present

## 2023-02-06 DIAGNOSIS — I10 Essential (primary) hypertension: Secondary | ICD-10-CM

## 2023-02-06 DIAGNOSIS — E118 Type 2 diabetes mellitus with unspecified complications: Secondary | ICD-10-CM | POA: Diagnosis not present

## 2023-02-06 DIAGNOSIS — J029 Acute pharyngitis, unspecified: Secondary | ICD-10-CM

## 2023-02-06 DIAGNOSIS — Z7984 Long term (current) use of oral hypoglycemic drugs: Secondary | ICD-10-CM

## 2023-02-06 DIAGNOSIS — R0602 Shortness of breath: Secondary | ICD-10-CM | POA: Diagnosis not present

## 2023-02-06 NOTE — Telephone Encounter (Signed)
Patient called and had questions about her allergy test.

## 2023-02-06 NOTE — Telephone Encounter (Signed)
10/25/16 all food testing was negative what other testing are we doing moving forward?

## 2023-02-06 NOTE — Telephone Encounter (Signed)
Do you think we might give or have it mailed here since she is here every 2 weeks and then she could pick it up during her next office visit?

## 2023-02-06 NOTE — Telephone Encounter (Signed)
Lm for pt to call us back about this testing she is scheduled for peanut and tree nuts and we can test any other foods she is concerned with

## 2023-02-06 NOTE — Telephone Encounter (Signed)
Patient called and stated she has some questions about some allergies of foods she has and wanted to know if her allergy test could be moved up and some more panels could be added.

## 2023-02-06 NOTE — Telephone Encounter (Signed)
Mekala called today wanting to start the [facility] test Dr Linford Arnold recommended. I did try to sign her up. However, they do not mail to PO Boxes. I called and left Chancey a message.

## 2023-02-07 DIAGNOSIS — R0602 Shortness of breath: Secondary | ICD-10-CM | POA: Diagnosis not present

## 2023-02-07 DIAGNOSIS — R000 Tachycardia, unspecified: Secondary | ICD-10-CM | POA: Diagnosis not present

## 2023-02-07 MED ORDER — METFORMIN HCL 500 MG/5ML PO SOLN
5.0000 mL | Freq: Two times a day (BID) | ORAL | 2 refills | Status: DC
Start: 2023-02-07 — End: 2023-02-23

## 2023-02-07 NOTE — Assessment & Plan Note (Signed)
Continue to work with therapist.  She sees her either weekly or every other week. Will move forward with GeneSight testing hopefully we can get that approved with her Medicare.

## 2023-02-07 NOTE — Telephone Encounter (Signed)
Faxed order

## 2023-02-07 NOTE — Telephone Encounter (Signed)
Pt said cantaloupe causes sores on tongue and swelling and bell peppers and garlic

## 2023-02-07 NOTE — Telephone Encounter (Signed)
See other telephone contact message

## 2023-02-07 NOTE — Assessment & Plan Note (Signed)
Is like the metoprolol wears off in between doses we have previously tried adding a third dose midday at 1 point in time I think would be reasonable for her to consider that again maybe a half a tab midday.  Or even consider switching to something like Bystolic that might have decreased potential for side effects to see if maybe she tolerates it a little bit better I know she has been hesitant with more long-acting drugs but I think something more long-acting would actually better control her symptoms.

## 2023-02-07 NOTE — Progress Notes (Addendum)
Virtual Visit via Video Note  I connected with Jennifer [name] on 02/23/23 at  3:20 PM EDT by a video enabled telemedicine application and verified that I am speaking with the correct person using two identifiers.   I discussed the limitations of evaluation and management by telemedicine and the availability of in person appointments. The patient expressed understanding and agreed to proceed.  Patient location: at home Provider location: in office    Subjective   Patient ID: Jennifer [name], female    DOB: Aug 01, 1966  Age: 57 y.o. MRN: [id number]  No chief complaint on file.   HPI  Complains of sore throat that she has had for several days.  She has been to the ED for couple different things but she has had them evaluate her throat as well.  She was negative for COVID.  She feels most like it is swollen and inflamed in the back of her throat.  More so on the left side.  She has had some postnasal drip particularly on that posterior left side.  No shortness of breath and no abnormal nasal discharge.  She says Flonase makes her nasal symptoms worse but has been doing her nasal rinses and has been using a little bit of Astelin.  Did call back saying she was interested in potentially doing the GeneSight testing to narrow down which prescriptions may be beneficial for mood for her.  I will check with [name] and see where were out on getting that ordered.  Send nuclear stress test was normal which is very reassuring but she still having some episodes of atypical chest pain palpitations.  Occasionally heart rate dropping into the 50s.  Diabetes follow-up-still not currently on medication last A1c was 6.4.  She read online that there is a liquid metformin available and said she would be willing to try it she has significant difficulty swallowing pills as they tend to get hung in her throat.  She is still looking to try to get in for physical therapy for her neck.    She is also scheduled  for colonoscopy and EGD in September.    ROS    Objective:     LMP 06/25/2013    Physical Exam   No results found for any visits on 02/06/23.    The ASCVD Risk score (Arnett DK, et al., 2019) failed to calculate for the following reasons:   The patient has a prior MI or stroke diagnosis    Assessment & Plan:   Problem List Items Addressed This Visit       Cardiovascular and Mediastinum   Hypertension with intolerance to multiple antihypertensive drugs    Is like the metoprolol wears off in between doses we have previously tried adding a third dose midday at 1 point in time I think would be reasonable for her to consider that again maybe a half a tab midday.  Or even consider switching to something like Bystolic that might have decreased potential for side effects to see if maybe she tolerates it a little bit better I know she has been hesitant with more long-acting drugs but I think something more long-acting would actually better control her symptoms.        Endocrine   Diabetes mellitus type 2 with complications (HCC)    See if we can get the liquid form of metformin covered.  It is available and looks like it might be be covered with insurance with prior approval.  Will  go ahead and initiate that process.      Relevant Medications   Metformin HCl 500 MG/5ML SOLN     Other   MDD (major depressive disorder), recurrent episode (HCC)    Continue to work with therapist.  She sees her either weekly or every other week. Will move forward with GeneSight testing hopefully we can get that approved with her Medicare.      Chest pain, atypical - Primary    Sinew to work with cardiology.  She is thinking about consulting with a new EP provider for further workup.  Unfortunately she still had some persistent symptoms since her ablation.      Other Visit Diagnoses     Sore throat           Sore throat-exam reassuring suspect postnasal drip or drainage from either  allergies or viral illness.  No sign of bacterial infection.  Continue with nasal saline rinses and salt water gargles.  No follow-ups on file.   I spent 40 minutes on the day of the encounter to include pre-visit record review, face-to-face time with the patient and post visit ordering of test.   I discussed the assessment and treatment plan with the patient. The patient was provided an opportunity to ask questions and all were answered. The patient agreed with the plan and demonstrated an understanding of the instructions.   The patient was advised to call back or seek an in-person evaluation if the symptoms worsen or if the condition fails to improve as anticipated.   Nani Gasser, MD   Nani Gasser, MD

## 2023-02-07 NOTE — Assessment & Plan Note (Signed)
See if we can get the liquid form of metformin covered.  It is available and looks like it might be be covered with insurance with prior approval.  Will go ahead and initiate that process.

## 2023-02-07 NOTE — Assessment & Plan Note (Signed)
Sinew to work with cardiology.  She is thinking about consulting with a new EP provider for further workup.  Unfortunately she still had some persistent symptoms since her ablation.

## 2023-02-07 NOTE — Telephone Encounter (Signed)
Placed the order. Because she has Medicare a form has to be signed by Dr Linford Arnold.

## 2023-02-08 DIAGNOSIS — R0602 Shortness of breath: Secondary | ICD-10-CM | POA: Diagnosis not present

## 2023-02-08 DIAGNOSIS — Z20822 Contact with and (suspected) exposure to covid-19: Secondary | ICD-10-CM | POA: Diagnosis not present

## 2023-02-08 DIAGNOSIS — Z556 Problems related to health literacy: Secondary | ICD-10-CM | POA: Diagnosis not present

## 2023-02-08 IMAGING — DX DG CHEST 2V
2 series · 2 of 2 positions shown · non-contrast
Comparison: Chest x-ray 01/04/2022

CLINICAL DATA: Shortness of breath.

EXAM:
CHEST - 2 VIEW

[chest pa]
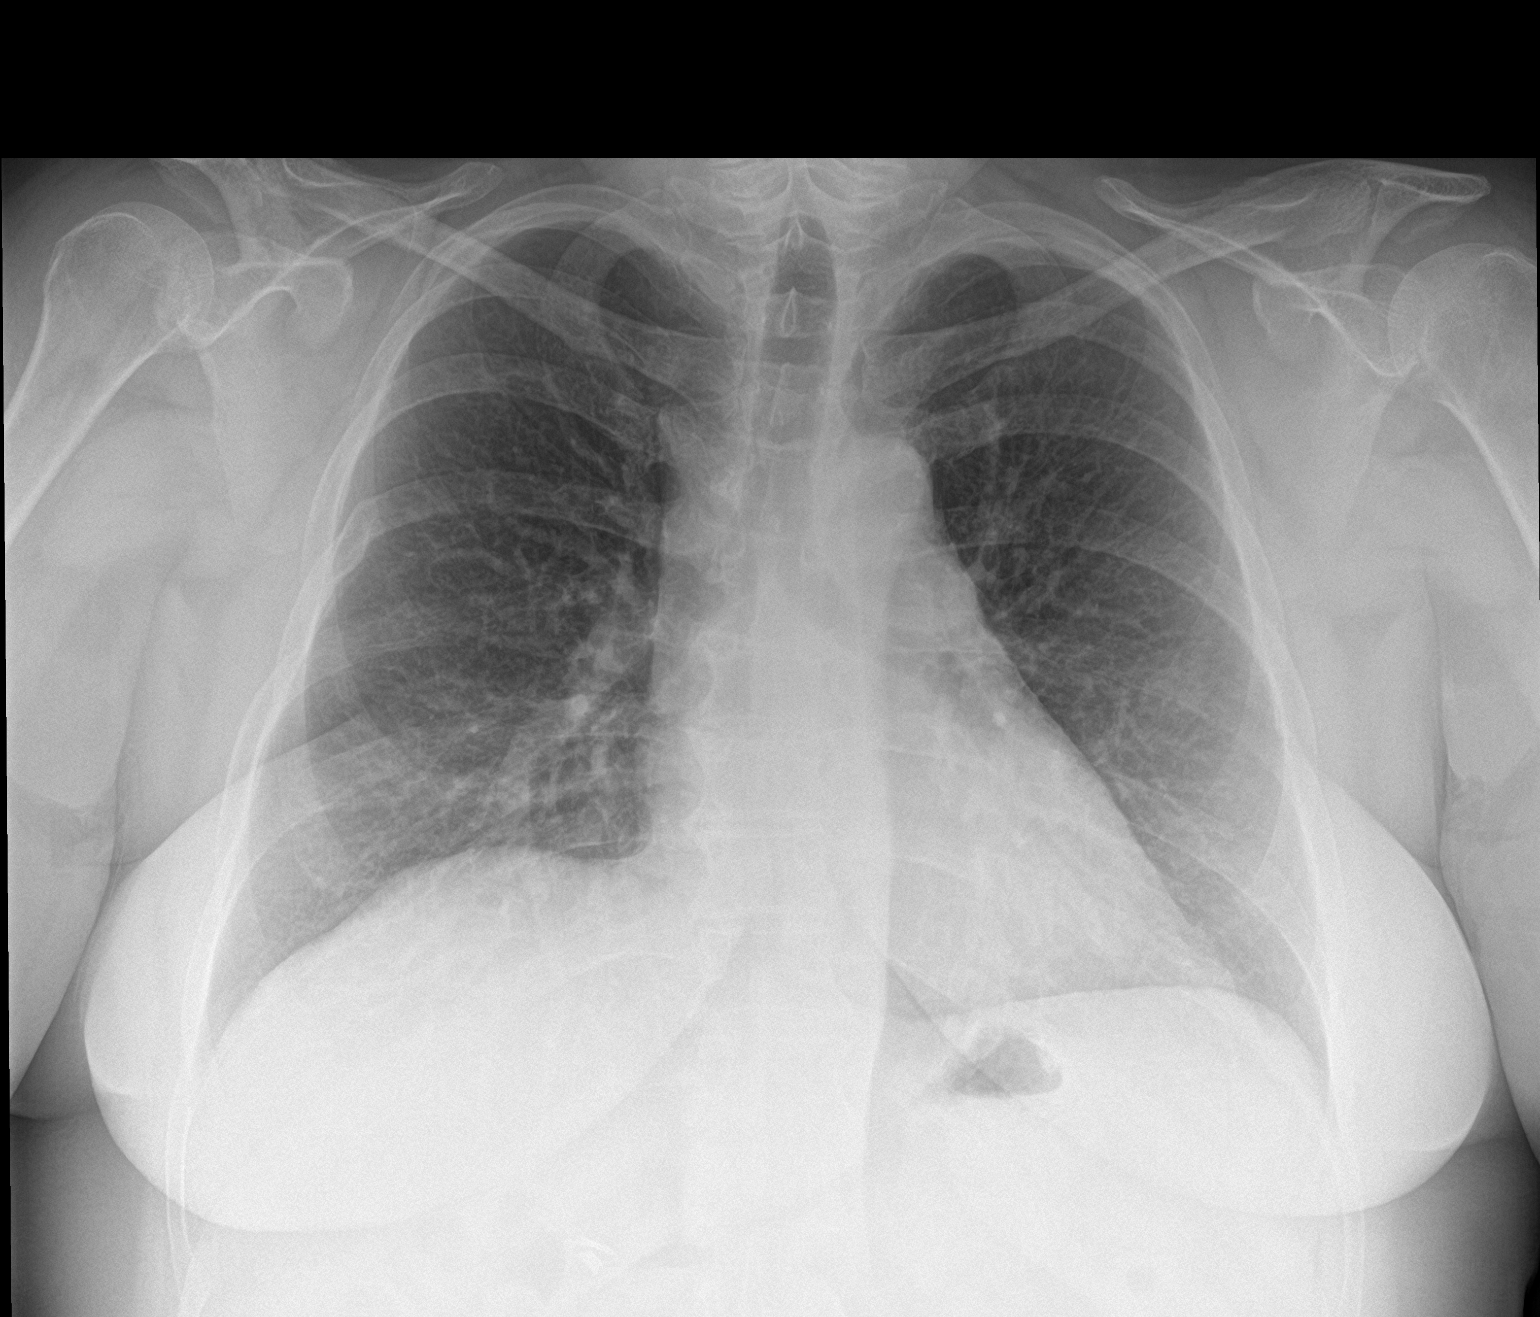

[chest lat]
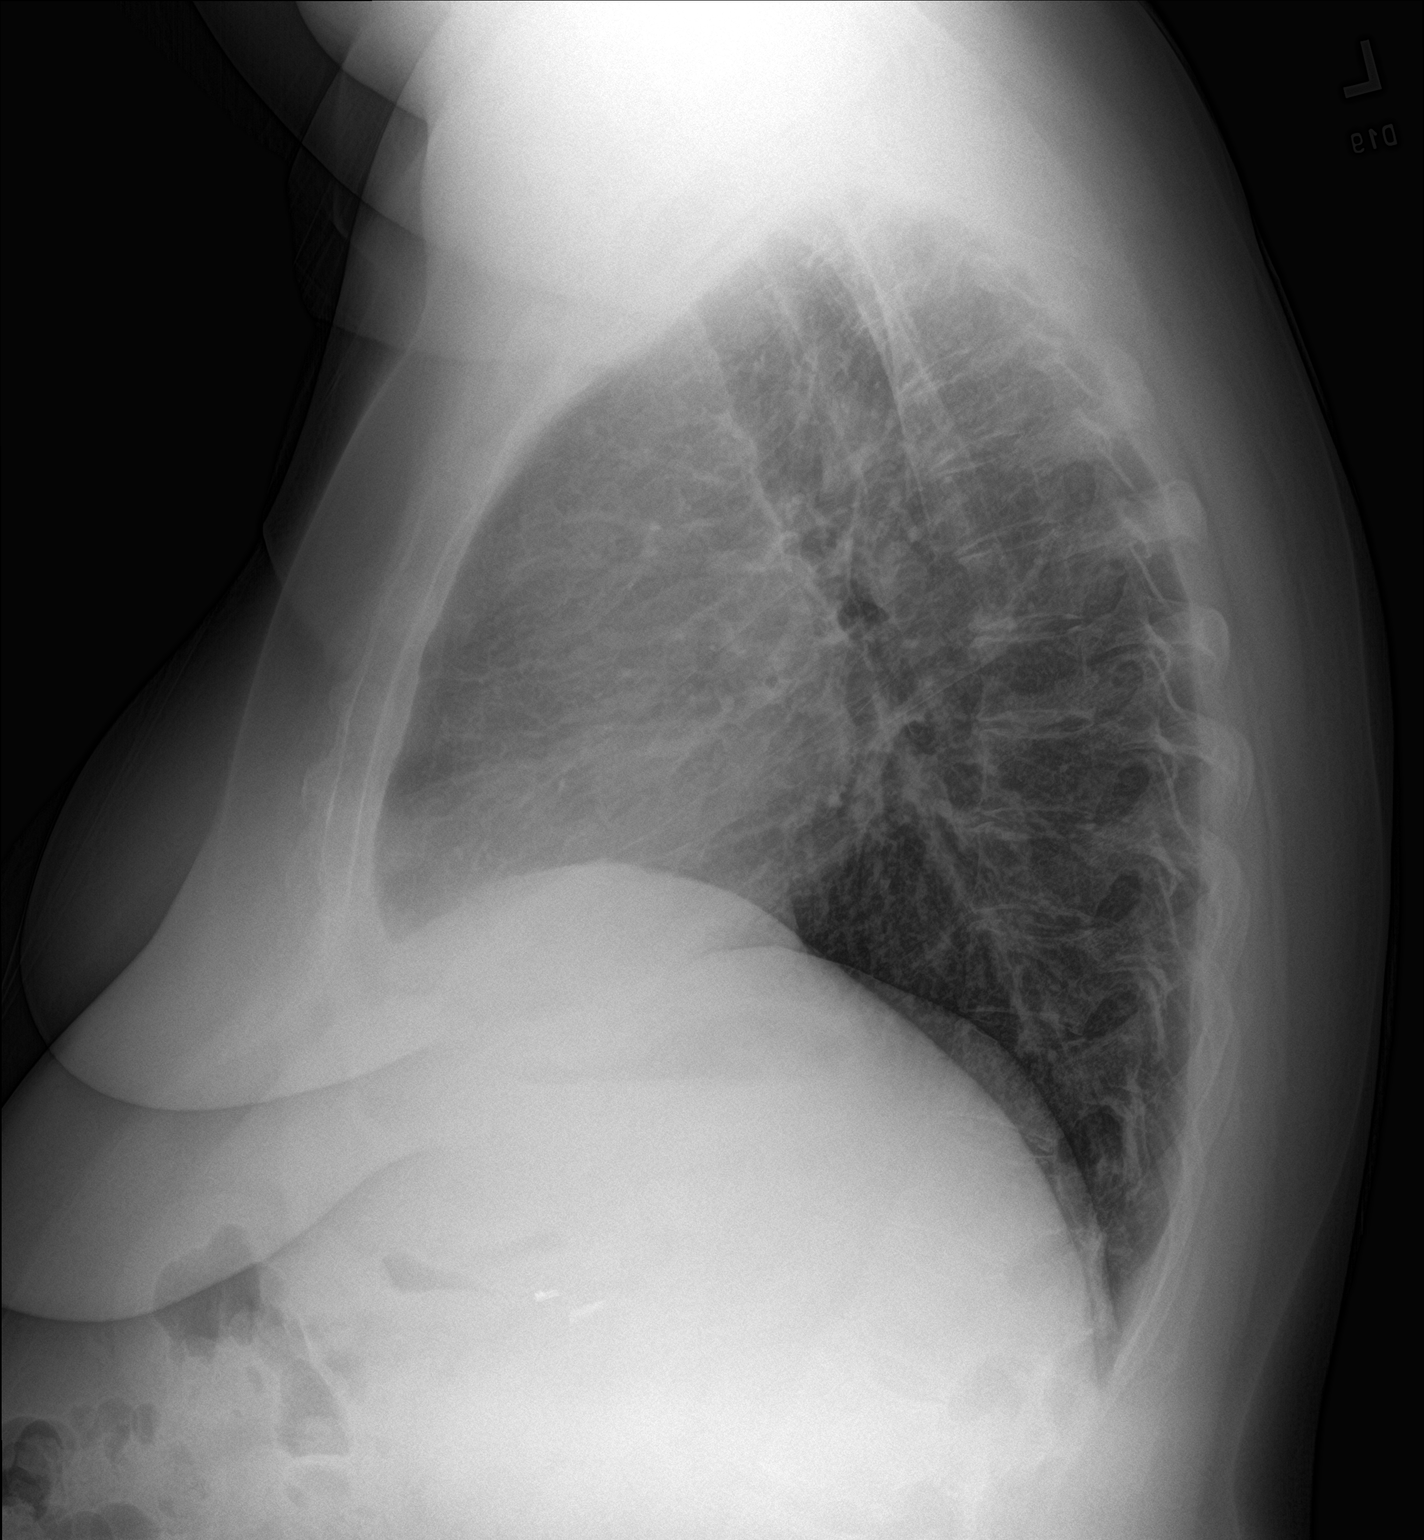

[2 of 2 positions shown; findings below may reference images not displayed]

FINDINGS: The heart size and mediastinal contours are within normal limits.
Both lungs are clear. There is a healed right sixth rib fracture,
unchanged.
IMPRESSION: No active cardiopulmonary disease.
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
## 2023-02-09 ENCOUNTER — Telehealth: Payer: Self-pay | Admitting: Family Medicine

## 2023-02-09 ENCOUNTER — Encounter: Payer: Self-pay | Admitting: Internal Medicine

## 2023-02-09 ENCOUNTER — Ambulatory Visit (INDEPENDENT_AMBULATORY_CARE_PROVIDER_SITE_OTHER): Payer: Medicare HMO | Admitting: Internal Medicine

## 2023-02-09 VITALS — BP 128/86 | HR 94 | Temp 98.2°F | Resp 16 | Ht 62.0 in | Wt 234.9 lb

## 2023-02-09 DIAGNOSIS — R490 Dysphonia: Secondary | ICD-10-CM

## 2023-02-09 DIAGNOSIS — J3089 Other allergic rhinitis: Secondary | ICD-10-CM | POA: Diagnosis not present

## 2023-02-09 DIAGNOSIS — J4530 Mild persistent asthma, uncomplicated: Secondary | ICD-10-CM

## 2023-02-09 DIAGNOSIS — J300 Vasomotor rhinitis: Secondary | ICD-10-CM

## 2023-02-09 MED ORDER — LEVALBUTEROL HCL 1.25 MG/3ML IN NEBU: 1.2500 mg | INHALATION_SOLUTION | RESPIRATORY_TRACT | 3 refills | Status: DC | PRN

## 2023-02-09 NOTE — Patient Instructions (Addendum)
Return in about 6 months (around 08/11/2023).  Jennifer [name]  Chronic Cough, Shortness of breath Daily controller medication(s): none; does not tolerate any inhaled steroids  May use levoalbuterol rescue inhaler 1-2 puffs or nebulizer every 4 to 6 hours as needed for shortness of breath, chest tightness, coughing, and wheezing. Monitor frequency of use.  Sending in refills of nebulizer solution for xopenex. Can try using half the solution at a time and use every 2 to 4 hours as needed. Use if feeling air hard to breath out or lungs feel like balloons Breathing control goals:  Full participation in all desired activities (may need albuterol before activity) Albuterol use two times or less a week on average (not counting use with activity) Cough interfering with sleep two times or less a month Oral steroids no more than once a year No hospitalizations   Reflux Continue dietary and lifestyle modifications. Continue the regimen as prescribed by your gastroenterologist.  Keep a diary to track symptoms and food exposures Follow-up with GI  Chronic rhinitis-irritant Continue azelastine 1 spray each nostril daily as needed for congestion. Continue flunisolide nasal spray 2 sprays in each nostril once a day a day as needed for stuffy nose.  In the right nostril, point the applicator out toward the right ear. In the left nostril, point the applicator out toward the left ear Continue to perform nasal saline rinses 1-3 times a day as needed to help clean sinus tract - neil med sinus rinses DO NOT USE TAP WATER, can use distilled water or tap water that you boil for several minutes and then let cool prior to use.  You can also try NeilMed Sinus Rinse Premixed Refill Packets With Xylitol and Sinus Rinse Extra Strength Hypertonic Packets. Can use salt water gargles to help with phlegm in throat.   Chronic conjunctivitis: irritant Purchase a rewetting eye drop such as systane, etc.  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 Thank you for allowing me to participate in your care.  Tonny Bollman, MD [facility] 

## 2023-02-09 NOTE — Telephone Encounter (Signed)
Called patient to notify her that gene site testing kit has arrived to the office. Pt states she will pick up Kit when she comes on 02/23/2023 to see Dr. Linford Arnold for office visit.

## 2023-02-10 DIAGNOSIS — R000 Tachycardia, unspecified: Secondary | ICD-10-CM | POA: Diagnosis not present

## 2023-02-10 DIAGNOSIS — R002 Palpitations: Secondary | ICD-10-CM | POA: Diagnosis not present

## 2023-02-11 DIAGNOSIS — R079 Chest pain, unspecified: Secondary | ICD-10-CM | POA: Diagnosis not present

## 2023-02-11 DIAGNOSIS — Z556 Problems related to health literacy: Secondary | ICD-10-CM | POA: Diagnosis not present

## 2023-02-11 DIAGNOSIS — R0789 Other chest pain: Secondary | ICD-10-CM | POA: Diagnosis not present

## 2023-02-12 DIAGNOSIS — I491 Atrial premature depolarization: Secondary | ICD-10-CM | POA: Diagnosis not present

## 2023-02-13 DIAGNOSIS — I4719 Other supraventricular tachycardia: Secondary | ICD-10-CM | POA: Diagnosis not present

## 2023-02-13 DIAGNOSIS — E782 Mixed hyperlipidemia: Secondary | ICD-10-CM | POA: Diagnosis not present

## 2023-02-13 DIAGNOSIS — E611 Iron deficiency: Secondary | ICD-10-CM | POA: Diagnosis not present

## 2023-02-13 DIAGNOSIS — Z79899 Other long term (current) drug therapy: Secondary | ICD-10-CM | POA: Diagnosis not present

## 2023-02-13 DIAGNOSIS — D500 Iron deficiency anemia secondary to blood loss (chronic): Secondary | ICD-10-CM | POA: Diagnosis not present

## 2023-02-13 DIAGNOSIS — I471 Supraventricular tachycardia, unspecified: Secondary | ICD-10-CM | POA: Diagnosis not present

## 2023-02-13 DIAGNOSIS — E531 Pyridoxine deficiency: Secondary | ICD-10-CM | POA: Diagnosis not present

## 2023-02-13 DIAGNOSIS — R002 Palpitations: Secondary | ICD-10-CM | POA: Diagnosis not present

## 2023-02-13 DIAGNOSIS — I10 Essential (primary) hypertension: Secondary | ICD-10-CM | POA: Diagnosis not present

## 2023-02-16 ENCOUNTER — Telehealth: Payer: Self-pay | Admitting: *Deleted

## 2023-02-16 ENCOUNTER — Encounter (HOSPITAL_COMMUNITY): Payer: Medicare HMO

## 2023-02-16 DIAGNOSIS — H9202 Otalgia, left ear: Secondary | ICD-10-CM | POA: Diagnosis not present

## 2023-02-16 DIAGNOSIS — R112 Nausea with vomiting, unspecified: Secondary | ICD-10-CM | POA: Diagnosis not present

## 2023-02-16 DIAGNOSIS — R079 Chest pain, unspecified: Secondary | ICD-10-CM | POA: Diagnosis not present

## 2023-02-16 DIAGNOSIS — Z87891 Personal history of nicotine dependence: Secondary | ICD-10-CM | POA: Diagnosis not present

## 2023-02-16 DIAGNOSIS — Z79899 Other long term (current) drug therapy: Secondary | ICD-10-CM | POA: Diagnosis not present

## 2023-02-16 DIAGNOSIS — R002 Palpitations: Secondary | ICD-10-CM | POA: Diagnosis not present

## 2023-02-16 DIAGNOSIS — R0789 Other chest pain: Secondary | ICD-10-CM | POA: Diagnosis not present

## 2023-02-16 DIAGNOSIS — R110 Nausea: Secondary | ICD-10-CM | POA: Diagnosis not present

## 2023-02-16 DIAGNOSIS — R140 Abdominal distension (gaseous): Secondary | ICD-10-CM | POA: Diagnosis not present

## 2023-02-16 DIAGNOSIS — R1013 Epigastric pain: Secondary | ICD-10-CM | POA: Diagnosis not present

## 2023-02-16 DIAGNOSIS — E669 Obesity, unspecified: Secondary | ICD-10-CM | POA: Diagnosis not present

## 2023-02-16 NOTE — Telephone Encounter (Signed)
Spoke with Ms. Jennifer [name] who called the office this morning requesting to be rescheduled for an appointment with Dr. Alvester Morin to discuss surgery. Appointment made for Monday, July 22nd at 2 pm with arrival 1:45 for check in. Patient agreed to date and time and had no further questions or concerns at this time.

## 2023-02-17 DIAGNOSIS — I10 Essential (primary) hypertension: Secondary | ICD-10-CM | POA: Diagnosis not present

## 2023-02-17 DIAGNOSIS — R112 Nausea with vomiting, unspecified: Secondary | ICD-10-CM | POA: Diagnosis not present

## 2023-02-17 DIAGNOSIS — R110 Nausea: Secondary | ICD-10-CM | POA: Diagnosis not present

## 2023-02-17 DIAGNOSIS — R002 Palpitations: Secondary | ICD-10-CM | POA: Diagnosis not present

## 2023-02-17 DIAGNOSIS — R140 Abdominal distension (gaseous): Secondary | ICD-10-CM | POA: Diagnosis not present

## 2023-02-17 DIAGNOSIS — R1084 Generalized abdominal pain: Secondary | ICD-10-CM | POA: Diagnosis not present

## 2023-02-17 DIAGNOSIS — R000 Tachycardia, unspecified: Secondary | ICD-10-CM | POA: Diagnosis not present

## 2023-02-18 DIAGNOSIS — R000 Tachycardia, unspecified: Secondary | ICD-10-CM | POA: Diagnosis not present

## 2023-02-20 ENCOUNTER — Ambulatory Visit: Admit: 2023-02-20 | Payer: Medicare HMO | Admitting: Psychiatry

## 2023-02-20 DIAGNOSIS — C519 Malignant neoplasm of vulva, unspecified: Secondary | ICD-10-CM

## 2023-02-20 SURGERY — EXAM UNDER ANESTHESIA
Anesthesia: General

## 2023-02-22 DIAGNOSIS — R0789 Other chest pain: Secondary | ICD-10-CM | POA: Diagnosis not present

## 2023-02-22 DIAGNOSIS — R079 Chest pain, unspecified: Secondary | ICD-10-CM | POA: Diagnosis not present

## 2023-02-22 DIAGNOSIS — R110 Nausea: Secondary | ICD-10-CM | POA: Diagnosis not present

## 2023-02-22 DIAGNOSIS — R9431 Abnormal electrocardiogram [ECG] [EKG]: Secondary | ICD-10-CM | POA: Diagnosis not present

## 2023-02-23 ENCOUNTER — Encounter (HOSPITAL_BASED_OUTPATIENT_CLINIC_OR_DEPARTMENT_OTHER): Payer: Self-pay | Admitting: Emergency Medicine

## 2023-02-23 ENCOUNTER — Encounter: Payer: Self-pay | Admitting: Family Medicine

## 2023-02-23 ENCOUNTER — Emergency Department (HOSPITAL_BASED_OUTPATIENT_CLINIC_OR_DEPARTMENT_OTHER)
Admission: EM | Admit: 2023-02-23 | Discharge: 2023-02-23 | Disposition: A | Discharge status: Home / Self Care | Payer: Medicare HMO | Attending: Emergency Medicine | Admitting: Emergency Medicine

## 2023-02-23 ENCOUNTER — Telehealth (INDEPENDENT_AMBULATORY_CARE_PROVIDER_SITE_OTHER): Payer: Medicare HMO | Admitting: Family Medicine

## 2023-02-23 ENCOUNTER — Other Ambulatory Visit: Payer: Self-pay

## 2023-02-23 DIAGNOSIS — K219 Gastro-esophageal reflux disease without esophagitis: Secondary | ICD-10-CM | POA: Diagnosis not present

## 2023-02-23 DIAGNOSIS — K5732 Diverticulitis of large intestine without perforation or abscess without bleeding: Secondary | ICD-10-CM | POA: Diagnosis not present

## 2023-02-23 DIAGNOSIS — Z7984 Long term (current) use of oral hypoglycemic drugs: Secondary | ICD-10-CM

## 2023-02-23 DIAGNOSIS — Z90710 Acquired absence of both cervix and uterus: Secondary | ICD-10-CM | POA: Diagnosis not present

## 2023-02-23 DIAGNOSIS — E1165 Type 2 diabetes mellitus with hyperglycemia: Secondary | ICD-10-CM | POA: Diagnosis not present

## 2023-02-23 DIAGNOSIS — K529 Noninfective gastroenteritis and colitis, unspecified: Secondary | ICD-10-CM

## 2023-02-23 DIAGNOSIS — R112 Nausea with vomiting, unspecified: Secondary | ICD-10-CM | POA: Diagnosis not present

## 2023-02-23 DIAGNOSIS — E118 Type 2 diabetes mellitus with unspecified complications: Secondary | ICD-10-CM

## 2023-02-23 DIAGNOSIS — I10 Essential (primary) hypertension: Secondary | ICD-10-CM | POA: Diagnosis not present

## 2023-02-23 DIAGNOSIS — R000 Tachycardia, unspecified: Secondary | ICD-10-CM | POA: Diagnosis not present

## 2023-02-23 DIAGNOSIS — Z91128 Patient's intentional underdosing of medication regimen for other reason: Secondary | ICD-10-CM | POA: Diagnosis not present

## 2023-02-23 DIAGNOSIS — R1032 Left lower quadrant pain: Secondary | ICD-10-CM | POA: Diagnosis not present

## 2023-02-23 DIAGNOSIS — N289 Disorder of kidney and ureter, unspecified: Secondary | ICD-10-CM | POA: Diagnosis not present

## 2023-02-23 DIAGNOSIS — Z7982 Long term (current) use of aspirin: Secondary | ICD-10-CM | POA: Diagnosis not present

## 2023-02-23 DIAGNOSIS — Z79899 Other long term (current) drug therapy: Secondary | ICD-10-CM | POA: Diagnosis not present

## 2023-02-23 DIAGNOSIS — R1084 Generalized abdominal pain: Secondary | ICD-10-CM | POA: Diagnosis not present

## 2023-02-23 LAB — CBC WITH DIFFERENTIAL/PLATELET
Abs Immature Granulocytes: 0.03 10*3/uL (ref 0.00–0.07)
Basophils Absolute: 0.1 10*3/uL (ref 0.0–0.1)
Basophils Relative: 1 %
Eosinophils Absolute: 0.1 10*3/uL (ref 0.0–0.5)
Eosinophils Relative: 2 %
HCT: 43.2 % (ref 36.0–46.0)
Hemoglobin: 14.3 g/dL (ref 12.0–15.0)
Immature Granulocytes: 0 %
Lymphocytes Relative: 27 %
Lymphs Abs: 1.9 10*3/uL (ref 0.7–4.0)
MCH: 29.4 pg (ref 26.0–34.0)
MCHC: 33.1 g/dL (ref 30.0–36.0)
MCV: 88.7 fL (ref 80.0–100.0)
Monocytes Absolute: 0.7 10*3/uL (ref 0.1–1.0)
Monocytes Relative: 9 %
Neutro Abs: 4.4 10*3/uL (ref 1.7–7.7)
Neutrophils Relative %: 61 %
Platelets: 190 10*3/uL (ref 150–400)
RBC: 4.87 MIL/uL (ref 3.87–5.11)
RDW: 13.9 % (ref 11.5–15.5)
WBC: 7.2 10*3/uL (ref 4.0–10.5)
nRBC: 0 % (ref 0.0–0.2)

## 2023-02-23 LAB — URINALYSIS, ROUTINE W REFLEX MICROSCOPIC
Bilirubin Urine: NEGATIVE
Glucose, UA: NEGATIVE mg/dL
Hgb urine dipstick: NEGATIVE
Ketones, ur: NEGATIVE mg/dL
Leukocytes,Ua: NEGATIVE
Nitrite: NEGATIVE
Protein, ur: NEGATIVE mg/dL
Specific Gravity, Urine: 1.02 (ref 1.005–1.030)
pH: 6.5 (ref 5.0–8.0)

## 2023-02-23 LAB — COMPREHENSIVE METABOLIC PANEL
ALT: 22 U/L (ref 0–44)
AST: 16 U/L (ref 15–41)
Albumin: 3.5 g/dL (ref 3.5–5.0)
Alkaline Phosphatase: 71 U/L (ref 38–126)
Anion gap: 10 (ref 5–15)
BUN: 12 mg/dL (ref 6–20)
CO2: 24 mmol/L (ref 22–32)
Calcium: 8.7 mg/dL — ABNORMAL LOW (ref 8.9–10.3)
Chloride: 104 mmol/L (ref 98–111)
Creatinine, Ser: 0.69 mg/dL (ref 0.44–1.00)
GFR, Estimated: >60 mL/min (ref >60)
Glucose, Bld: 135 mg/dL — ABNORMAL HIGH (ref 70–99)
Potassium: 3.5 mmol/L (ref 3.5–5.1)
Sodium: 138 mmol/L (ref 135–145)
Total Bilirubin: 0.5 mg/dL (ref 0.3–1.2)
Total Protein: 6.7 g/dL (ref 6.5–8.1)

## 2023-02-23 LAB — LIPASE, BLOOD: Lipase: 34 U/L (ref 11–51)

## 2023-02-23 MED ORDER — METFORMIN HCL 500 MG/5ML PO SOLN
5.0000 mL | Freq: Two times a day (BID) | ORAL | 2 refills | Status: DC
Start: 2023-02-23 — End: 2023-05-18

## 2023-02-23 MED ORDER — ONDANSETRON HCL 4 MG/2ML IJ SOLN
4.0000 mg | Freq: Once | INTRAMUSCULAR | Status: AC
  Administered 2023-02-23: 4 mg via INTRAVENOUS
  Filled 2023-02-23: qty 2

## 2023-02-23 NOTE — ED Provider Notes (Signed)
[place] [facility] AT [facility] Provider Note   CSN: [id number] Arrival date & time: 02/23/23  0455     History  Chief Complaint  Patient presents with   Abdominal Pain    Jennifer [name] is a 57 y.o. female.  The history is provided by the patient and medical records.  Abdominal Pain Jennifer [name] is a 57 y.o. female who presents to the Emergency Department complaining of abdominal pain.  She reports 1 week of left lower quadrant abdominal pain that radiates to the right lower quadrant.  She describes it as a punching type sensation.  She has associated nausea and occasional vomiting.  No fever.  No emesis today.  She states that previously when she was vomiting it was when she ate something.  She also complains of pale yellow stool yesterday, none today.  She had been taking stool softeners prior to her frequent loose stools yesterday.  She has experienced similar abdominal pain secondary to diverticulitis as well as norovirus.   Has pagets disease. Had cardiac ablation for [facility] in April. Takes metoprolol tid. Last dose last night.      Home Medications Prior to Admission medications   Medication Sig Start Date End Date Taking? Authorizing Provider  ACCU-CHEK GUIDE test strip For testing blood sugars 2 times daily. E11.65 11/06/22   Agapito Games, MD  AMBULATORY NON FORMULARY MEDICATION Medication Name: standing order BMP x 1 year 09/08/22   Agapito Games, MD  azelastine (ASTELIN) 0.1 % nasal spray Place 1 spray into both nostrils daily as needed for rhinitis. Use in each nostril as directed 01/11/23   Verlee Monte, MD  betamethasone valerate ointment (VALISONE) 0.1 % Apply 1 Application topically daily. Apply thin layer and then apply dye free and perfume free moisturizer on top. 01/05/23   Agapito Games, MD  blood glucose meter kit and supplies Dispense based on patient and insurance preference. Use to check blood sugars twice  daily E11.9 11/06/22   Agapito Games, MD  EPINEPHrine (EPIPEN 2-PAK) 0.3 mg/0.3 mL IJ SOAJ injection Inject 0.3 mg into the muscle as needed for anaphylaxis. 01/11/23   Verlee Monte, MD  famotidine (PEPCID) 40 MG tablet Take 1 tablet (40 mg total) by mouth 2 (two) times daily. 01/12/23 04/06/23  Agapito Games, MD  levalbuterol [facility] HFA) 45 MCG/ACT inhaler Inhale 1 puff into the lungs every 4 (four) hours as needed for wheezing or shortness of breath. 10/18/22   Birder Robson, MD  levalbuterol Pauline Aus) 1.25 MG/3ML nebulizer solution Take 1.25 mg by nebulization every 4 (four) hours as needed for wheezing. 02/09/23   Verlee Monte, MD  Metformin HCl 500 MG/5ML SOLN Take 5 mLs (500 mg total) by mouth 2 (two) times daily with a meal. 02/07/23   Agapito Games, MD  metoprolol tartrate (LOPRESSOR) 12.5 mg TABS tablet Take 12.5 mg by mouth 2 (two) times daily.    [provider]  potassium chloride (KLOR-CON) 20 MEQ packet Take 20 mEq by mouth daily. 11/06/22   Agapito Games, MD  spironolactone (ALDACTONE) 12.5 mg TABS tablet Take 12.5 mg by mouth daily.    [provider]      Allergies    Azithromycin, Ciprofloxacin, Codeine, Erythromycin base, Sulfa antibiotics, Sulfasalazine, Telmisartan, Ace inhibitors, Aspirin, Atenolol, Avelox [moxifloxacin hcl in nacl], Beta adrenergic blockers, Buspar [buspirone], Butorphanol tartrate, Cetirizine, Clonidine hcl, Cortisone, Erythromycin, Fentanyl, Fluoxetine hcl, Ketorolac tromethamine, Lidocaine, Lisinopril, Metoclopramide hcl,  Midazolam, Montelukast, Montelukast sodium, Naproxen, Paroxetine, Penicillins, Pravastatin, Promethazine, Promethazine hcl, Quinolones, Serotonin reuptake inhibitors (ssris), Sertraline hcl, Stelazine [trifluoperazine], Tobramycin, Trifluoperazine hcl, Atrovent nasal spray [ipratropium], Diltiazem, Iodinated contrast media, Metoprolol, Polyethylene glycol 3350, Propoxyphene, Adhesive [tape],  Butorphanol, Ceftriaxone, Iron, Metoclopramide, Metronidazole, Other, Prednisone, Prochlorperazine, Venlafaxine, and Zyrtec [cetirizine hcl]    Review of Systems   Review of Systems  Gastrointestinal:  Positive for abdominal pain.  All other systems reviewed and are negative.   Physical Exam Updated Vital Signs BP (!) 158/90   Pulse 97   Temp 98.5 F (36.9 C) (Oral)   Resp 15   Ht 5\' 2"  (1.575 m)   Wt 106.1 kg   LMP 06/25/2013   SpO2 100%   BMI 42.80 kg/m  Physical Exam Vitals and nursing note reviewed.  Constitutional:      Appearance: She is well-developed.  HENT:     Head: Normocephalic and atraumatic.  Cardiovascular:     Rate and Rhythm: Normal rate and regular rhythm.  Pulmonary:     Effort: Pulmonary effort is normal. No respiratory distress.  Abdominal:     Palpations: Abdomen is soft.     Tenderness: There is no guarding or rebound.     Comments: Moderate LLQ tenderness  Musculoskeletal:        General: No tenderness.  Skin:    General: Skin is warm and dry.  Neurological:     Mental Status: She is alert and oriented to person, place, and time.  Psychiatric:     Comments: Anxious     ED Results / Procedures / Treatments   Labs (all labs ordered are listed, but only abnormal results are displayed) Labs Reviewed  COMPREHENSIVE METABOLIC PANEL - Abnormal; Notable for the following components:      Result Value   Glucose, Bld 135 (*)    Calcium 8.7 (*)    All other components within normal limits  URINALYSIS, ROUTINE W REFLEX MICROSCOPIC - Abnormal; Notable for the following components:   APPearance HAZY (*)    All other components within normal limits  CBC WITH DIFFERENTIAL/PLATELET  LIPASE, BLOOD    EKG None  Radiology No results found.  Procedures Procedures    Medications Ordered in ED Medications  ondansetron (ZOFRAN) injection 4 mg (4 mg Intravenous Given 02/23/23 0524)    ED Course/ Medical Decision Making/ A&P                              Medical Decision Making Amount and/or Complexity of Data Reviewed Labs: ordered.  Risk Prescription drug management.   Patient with history of atrial tachycardia status post ablation in April, Paget's disease of the vulva, somatizations disorder here for evaluation of abdominal pain.  She did have vomiting and diarrhea previously, none today.  She does have tenderness on examination without peritoneal findings.  Patient states that she has a history of diverticulitis.  On record review she does have a documented history of diverticulosis, no recent diagnoses of diverticulitis in the last year.  She has had multiple emergency department visits at different facilities for chest pain and abdominal pain complaints.  She has had multiple CT scans for these complaints including 6 abdominal CTs since January of this year.  Her labs are currently reassuring and she has no current fever, no active vomiting and her diarrhea is resolved.  Current clinical picture is not consistent with complicated diverticulitis which would warrant antibiotics or admission  at this time.  Discussed with patient pain control with warm compresses, rest.  Offered to send stool studies but patient is not able to provide a stool sample at this time.  Discussed outpatient follow-up for further evaluation as well as return precautions.        Final Clinical Impression(s) / ED Diagnoses Final diagnoses:  Left lower quadrant abdominal pain    Rx / DC Orders ED Discharge Orders     None         Tilden Fossa, MD 02/23/23 [phone]

## 2023-02-23 NOTE — ED Triage Notes (Addendum)
Patient from home BIB [facility] for severe abdominal pain across the abdomen but states its worse on the left side describing it as sharp and constant. States she woke up with it this morning at 0400 am. Patient seen at [facility] and given Bentyl. States her stools are mucous consistency and yellow. Endorses n/v/d for a week now. Hx of diverticulitis

## 2023-02-23 NOTE — Assessment & Plan Note (Signed)
Resend the liquid metformin since it has been 2 weeks.  Encouraged her to also consider calling the pharmacy.  If it needs authorization from the insurance please let us know but we have not received any paperwork to indicate that it requires any special authorization.  It may also be that they are just trying to order it and get it in as it is not a typical prescription for metformin.

## 2023-02-23 NOTE — Progress Notes (Signed)
Virtual Visit via Video Note  I connected with Jennifer [name] on 02/23/23 at  2:20 PM EDT by a video enabled telemedicine application and verified that I am speaking with the correct person using two identifiers.   I discussed the limitations of evaluation and management by telemedicine and the availability of in person appointments. The patient expressed understanding and agreed to proceed.  Patient location: at home Provider location: in office  Subjective:    CC:   Chief Complaint  Patient presents with   Follow-up    HPI: She is here today for virtual visit.  She is actually in the emergency room at this moment.  She says starting yesterday she started having significant abdominal pain and cramping as well as passing some mucousy yellow stools.  No one else around her has been sick she has not had any fever or chills.  Says she has vomited a couple of times.  She is not sure if maybe she has eaten something bad but she is having a lot of abdominal pain right now.  They did give her some Zofran but she does not really want anything heavy-duty.  So interested in starting the metformin.  We have actually sent the liquid formulation in at the end of June but she says she never got the prescription from the pharmacy and they never called her and told her that it was ready to pick up.  She did want to know if we might be able to order stool cultures if the hospital does not do them.   Past medical history, Surgical history, Family history not pertinant except as noted below, Social history, Allergies, and medications have been entered into the medical record, reviewed, and corrections made.    Objective:    General: Speaking clearly in complete sentences without any shortness of breath.  Alert and oriented x3.  Normal judgment. No apparent acute distress.    Impression and Recommendations:    Problem List Items Addressed This Visit       Endocrine   Diabetes mellitus  type 2 with complications (HCC)    Resend the liquid metformin since it has been 2 weeks.  Encouraged her to also consider calling the pharmacy.  If it needs authorization from the insurance please let us know but we have not received any paperwork to indicate that it requires any special authorization.  It may also be that they are just trying to order it and get it in as it is not a typical prescription for metformin.      Relevant Medications   Metformin HCl 500 MG/5ML SOLN   Other Visit Diagnoses     Colitis    -  Primary      Colitis -she is at the ED receiving care currently.  If we need to am happy to send stool cultures if her symptoms progress or persist over the weekend.  I suspect either food poisoning or some type of viral illness.  Hopefully will resolve on its own.    No orders of the defined types were placed in this encounter.   Meds ordered this encounter  Medications   Metformin HCl 500 MG/5ML SOLN    Sig: Take 5 mLs (500 mg total) by mouth 2 (two) times daily with a meal.    Dispense:  473 mL    Refill:  2    cannot take metformin orally.  Difficulty swallowing.     I discussed the assessment and  treatment plan with the patient. The patient was provided an opportunity to ask questions and all were answered. The patient agreed with the plan and demonstrated an understanding of the instructions.   The patient was advised to call back or seek an in-person evaluation if the symptoms worsen or if the condition fails to improve as anticipated.  I spent 20 minutes on the day of the encounter to include pre-visit record review, face-to-face time with the patient and post visit ordering of test.   Nani Gasser, MD

## 2023-02-23 NOTE — Progress Notes (Signed)
Pt is currently at the ED. She reports that she has had problems with having a BM for the past couple of days.   She wanted to try to see if Dr. Linford Arnold had found out about getting the Metformin liquid and a referral to Cardiology in [place].   And also would like to get stool cx done if they don't do them at the hospital.

## 2023-02-24 ENCOUNTER — Encounter: Payer: Self-pay | Admitting: Family Medicine

## 2023-02-24 DIAGNOSIS — K5732 Diverticulitis of large intestine without perforation or abscess without bleeding: Secondary | ICD-10-CM | POA: Diagnosis not present

## 2023-02-26 ENCOUNTER — Telehealth: Payer: Self-pay

## 2023-02-26 DIAGNOSIS — K5792 Diverticulitis of intestine, part unspecified, without perforation or abscess without bleeding: Secondary | ICD-10-CM | POA: Diagnosis not present

## 2023-02-26 DIAGNOSIS — R109 Unspecified abdominal pain: Secondary | ICD-10-CM | POA: Diagnosis not present

## 2023-02-26 DIAGNOSIS — K5732 Diverticulitis of large intestine without perforation or abscess without bleeding: Secondary | ICD-10-CM | POA: Diagnosis not present

## 2023-02-26 DIAGNOSIS — F418 Other specified anxiety disorders: Secondary | ICD-10-CM | POA: Diagnosis not present

## 2023-02-26 DIAGNOSIS — N289 Disorder of kidney and ureter, unspecified: Secondary | ICD-10-CM | POA: Diagnosis not present

## 2023-02-26 NOTE — Telephone Encounter (Signed)
Initiated Prior authorization [id number] HCl 500MG /5ML solution  Via: Covermymeds Case/[id number]: Status: approved as of  02/26/23 Reason:Authorization Expiration Date: 08/13/2023  Notified Pt via: Mychart

## 2023-02-26 NOTE — Telephone Encounter (Signed)
Left detailed message on pharmacy phone # with PA approval for metformin liquid.

## 2023-02-26 NOTE — Telephone Encounter (Signed)
Pharmacy called. - states Metformin liquid needing a PA - states patient can not swallow pills so must use liquid form.  Thanks [employer].

## 2023-02-27 ENCOUNTER — Ambulatory Visit (INDEPENDENT_AMBULATORY_CARE_PROVIDER_SITE_OTHER): Payer: Medicare HMO | Admitting: Family Medicine

## 2023-02-27 VITALS — BP 159/79 | HR 81 | Ht 62.0 in | Wt 234.0 lb

## 2023-02-27 DIAGNOSIS — R1032 Left lower quadrant pain: Secondary | ICD-10-CM | POA: Diagnosis not present

## 2023-02-27 DIAGNOSIS — E860 Dehydration: Secondary | ICD-10-CM | POA: Diagnosis not present

## 2023-02-27 DIAGNOSIS — R0789 Other chest pain: Secondary | ICD-10-CM | POA: Diagnosis not present

## 2023-02-27 DIAGNOSIS — K5792 Diverticulitis of intestine, part unspecified, without perforation or abscess without bleeding: Secondary | ICD-10-CM

## 2023-02-27 DIAGNOSIS — R140 Abdominal distension (gaseous): Secondary | ICD-10-CM | POA: Diagnosis not present

## 2023-02-27 DIAGNOSIS — R079 Chest pain, unspecified: Secondary | ICD-10-CM | POA: Diagnosis not present

## 2023-02-27 MED ORDER — AMBULATORY NON FORMULARY MEDICATION: 0 refills | Status: AC

## 2023-02-27 NOTE — Progress Notes (Unsigned)
Established Patient Office Visit  Subjective   Patient ID: Jennifer [name], female    DOB: 01-Jun-1966  Age: 58 y.o. MRN: [id number]  No chief complaint on file.   HPI Here for hosp f/u for diverticulitis.  She was seen and eval and dx with diverticulitis. She was started o one dose of miripenem and then switched to Cleocin based on alleriges. She was feeling better and was d/c. Tried to call and chedule f/u with her GI but they are booked out till Sept.  She has started feeling worse again but only took the oral clindamycin for one more day afer discharged and didn't take it on Monday.  Says she took old rx for 150mg  yesterday.  No fever but having more loose yellow stools and pain.   CT from yesterday with mild focal diverticulitis at the junction of the descending and sigmoid colon.  No abscess.  Says she did try to call and follow-up with GI.  But they could not get her in until well until August.  They did go ahead and cancel her colonoscopy which was scheduled.    ROS    Objective:     BP (!) 159/79   Pulse 81   Ht 5\' 2"  (1.575 m)   Wt 234 lb (106.1 kg)   LMP 06/25/2013   SpO2 99%   BMI 42.80 kg/m    Physical Exam Vitals reviewed.  Constitutional:      Appearance: Normal appearance.  HENT:     Head: Normocephalic and atraumatic.  Cardiovascular:     Rate and Rhythm: Normal rate and regular rhythm.     Heart sounds: Normal heart sounds.  Pulmonary:     Effort: Pulmonary effort is normal.     Breath sounds: Normal breath sounds.  Abdominal:     General: Bowel sounds are normal. There is distension.     Palpations: Abdomen is soft.     Tenderness: There is abdominal tenderness.     Comments: Most tender in the LLQ      No results found for any visits on 02/27/23.    The ASCVD Risk score (Arnett DK, et al., 2019) failed to calculate for the following reasons:   The patient has a prior MI or stroke diagnosis    Assessment & Plan:   Problem List Items  Addressed This Visit   None Visit Diagnoses     LLQ pain    -  Primary   Diverticulitis         Discussed options to monitor without antibiotics which I think is the best choice since she is eating and no fever, normal WBC yesterday,  and has multiple allergies.  Or if she decides to restart the clinda (she is aware is not the optimal abx treatment) the needs to do the ful 300 mg TID.    Recommend bowel rest.  Make sure staying well-hydrated and stick with liquid type foods such as soup broth etc.  If you are starting to feel better in the next couple days then you can move more to soft foods no spicy foods avoid milk products and dairy products especially until you are feeling completely better.  Okay to use ibuprofen 400 mg every 6 hours as needed for the next couple days you can overlap with Tylenol.  Will repeat CBC tomorrow to make sure not trending up.   She would also like new ref to [facility]   No follow-ups on file.   I  spent 40 minutes on the day of the encounter to include pre-visit record review, face-to-face time with the patient and post visit ordering of test.   Nani Gasser, MD

## 2023-02-27 NOTE — Progress Notes (Unsigned)
Pt would like to know if she can take IBU with Clindamycin?  Ok to send [employer] to her about this.

## 2023-02-27 NOTE — Patient Instructions (Signed)
Recommend bowel rest.  Make sure staying well-hydrated and stick with liquid type foods such as soup broth etc.  If you are starting to feel better in the next couple days then you can move more to soft foods no spicy foods avoid milk products and dairy products especially until you are feeling completely better.  Okay to use ibuprofen 400 mg every 6 hours as needed for the next couple days you can overlap with Tylenol.

## 2023-02-28 ENCOUNTER — Telehealth: Payer: Self-pay | Admitting: Family Medicine

## 2023-02-28 ENCOUNTER — Telehealth: Payer: Self-pay

## 2023-02-28 DIAGNOSIS — R000 Tachycardia, unspecified: Secondary | ICD-10-CM | POA: Diagnosis not present

## 2023-02-28 NOTE — Telephone Encounter (Signed)
No need to repeat blood culture.  I am glad she is hydrated. Keep pushing fluids.

## 2023-02-28 NOTE — Telephone Encounter (Signed)
Patient called.  States she was told that the blood culture results are showing compromised. She wants to know if she needs to have this repeated She also states that went to [facility] ER last night  and was found to be dehydrated and was given fluids States the CBC with Diff was "off" and wanting Dr. Linford Arnold to review this.  States she is still feeling very sick. She stats that her abdomen is tight and she is nauseated  Feeling worse.  Requesting a call back asap.

## 2023-02-28 NOTE — Telephone Encounter (Signed)
I called Jennifer [name] to update meaningful use information for upcoming appointment 7/22 with Dr. Alvester Morin. Pt states she is going to cancel for now, declined to reschedule. Stating she was in the ER last night not feeling well and was recently diagnosed with acute diverticulitis. She is waiting for someone (ER doc or PCP) to call her with recent results.  She apologizes and will call back to reschedule.   Message sent to Dr. Alvester Morin as an Lorain Childes

## 2023-02-28 NOTE — Telephone Encounter (Signed)
Pt called. Test results from [facility]  showing that she has an infection in her blood? Wants  a call back.

## 2023-03-01 DIAGNOSIS — K5792 Diverticulitis of intestine, part unspecified, without perforation or abscess without bleeding: Secondary | ICD-10-CM | POA: Diagnosis not present

## 2023-03-01 DIAGNOSIS — K760 Fatty (change of) liver, not elsewhere classified: Secondary | ICD-10-CM | POA: Diagnosis not present

## 2023-03-01 DIAGNOSIS — R109 Unspecified abdominal pain: Secondary | ICD-10-CM | POA: Diagnosis not present

## 2023-03-02 ENCOUNTER — Ambulatory Visit: Payer: Medicare HMO | Admitting: Internal Medicine

## 2023-03-02 ENCOUNTER — Encounter: Payer: Self-pay | Admitting: Internal Medicine

## 2023-03-02 VITALS — BP 144/96 | HR 102 | Temp 98.4°F | Resp 20

## 2023-03-02 DIAGNOSIS — K219 Gastro-esophageal reflux disease without esophagitis: Secondary | ICD-10-CM | POA: Diagnosis not present

## 2023-03-02 DIAGNOSIS — R490 Dysphonia: Secondary | ICD-10-CM

## 2023-03-02 DIAGNOSIS — R0602 Shortness of breath: Secondary | ICD-10-CM | POA: Diagnosis not present

## 2023-03-02 DIAGNOSIS — J3089 Other allergic rhinitis: Secondary | ICD-10-CM | POA: Diagnosis not present

## 2023-03-02 DIAGNOSIS — Z888 Allergy status to other drugs, medicaments and biological substances status: Secondary | ICD-10-CM | POA: Diagnosis not present

## 2023-03-02 DIAGNOSIS — R053 Chronic cough: Secondary | ICD-10-CM | POA: Diagnosis not present

## 2023-03-02 DIAGNOSIS — T3695XA Adverse effect of unspecified systemic antibiotic, initial encounter: Secondary | ICD-10-CM

## 2023-03-02 DIAGNOSIS — J4530 Mild persistent asthma, uncomplicated: Secondary | ICD-10-CM
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
## 2023-03-02 NOTE — Patient Instructions (Addendum)
Your breathing test today looks great! Better than last time.  Your vital signs are reassuring-not suggestive of allergic reaction, your lung sounds are normal, you do not have visible swelling or hives.    You are not having an acute allergic reaction which is great news! You are outside of the window for having anaphylaxis from the medications or contrast you were given yesterday.  You still have chronic nasal and throat inflammation. This can be from allergies, reflux or vasomotor symptoms.   Continue you salt gargles, saline rinses and follow-up with GI and ENT to make sure your reflux and muscle tension dysphonia are being addressed properly.  For abdominal pain, follow up with gastroenterology.   For shortness of breath, can try xopenex in your nebulizer machine to see if this gives you relief.  Your breathing test today and symptoms do not suggest asthma flare.  Follow up : for skin It was a pleasure seeing you again in clinic today! Thank you for allowing me to participate in your care.  Tonny Bollman, MD [facility]

## 2023-03-03 DIAGNOSIS — Z79899 Other long term (current) drug therapy: Secondary | ICD-10-CM | POA: Diagnosis not present

## 2023-03-03 DIAGNOSIS — R079 Chest pain, unspecified: Secondary | ICD-10-CM | POA: Diagnosis not present

## 2023-03-03 DIAGNOSIS — R0789 Other chest pain: Secondary | ICD-10-CM | POA: Diagnosis not present

## 2023-03-04 DIAGNOSIS — R079 Chest pain, unspecified: Secondary | ICD-10-CM | POA: Diagnosis not present

## 2023-03-04 DIAGNOSIS — R000 Tachycardia, unspecified: Secondary | ICD-10-CM | POA: Diagnosis not present

## 2023-03-05 ENCOUNTER — Ambulatory Visit: Payer: Medicare HMO | Admitting: Psychiatry

## 2023-03-06 DIAGNOSIS — Z87891 Personal history of nicotine dependence: Secondary | ICD-10-CM | POA: Diagnosis not present

## 2023-03-06 DIAGNOSIS — R0789 Other chest pain: Secondary | ICD-10-CM | POA: Diagnosis not present

## 2023-03-06 DIAGNOSIS — R000 Tachycardia, unspecified: Secondary | ICD-10-CM | POA: Diagnosis not present

## 2023-03-06 DIAGNOSIS — R079 Chest pain, unspecified: Secondary | ICD-10-CM | POA: Diagnosis not present

## 2023-03-08 ENCOUNTER — Telehealth: Payer: Medicare HMO | Admitting: Family Medicine

## 2023-03-09 ENCOUNTER — Encounter: Payer: Self-pay | Admitting: Family Medicine

## 2023-03-09 ENCOUNTER — Ambulatory Visit (INDEPENDENT_AMBULATORY_CARE_PROVIDER_SITE_OTHER): Payer: Medicare HMO | Admitting: Family Medicine

## 2023-03-09 VITALS — BP 167/92 | HR 74 | Wt 233.0 lb

## 2023-03-09 DIAGNOSIS — D1720 Benign lipomatous neoplasm of skin and subcutaneous tissue of unspecified limb: Secondary | ICD-10-CM | POA: Diagnosis not present

## 2023-03-09 DIAGNOSIS — F418 Other specified anxiety disorders: Secondary | ICD-10-CM | POA: Diagnosis not present

## 2023-03-09 DIAGNOSIS — K5792 Diverticulitis of intestine, part unspecified, without perforation or abscess without bleeding: Secondary | ICD-10-CM | POA: Diagnosis not present

## 2023-03-09 DIAGNOSIS — R1032 Left lower quadrant pain: Secondary | ICD-10-CM | POA: Diagnosis not present

## 2023-03-09 DIAGNOSIS — T364X5A Adverse effect of tetracyclines, initial encounter: Secondary | ICD-10-CM | POA: Diagnosis not present

## 2023-03-09 DIAGNOSIS — R110 Nausea: Secondary | ICD-10-CM | POA: Diagnosis not present

## 2023-03-09 DIAGNOSIS — R399 Unspecified symptoms and signs involving the genitourinary system: Secondary | ICD-10-CM

## 2023-03-09 DIAGNOSIS — Z882 Allergy status to sulfonamides status: Secondary | ICD-10-CM | POA: Diagnosis not present

## 2023-03-09 DIAGNOSIS — R109 Unspecified abdominal pain: Secondary | ICD-10-CM | POA: Diagnosis not present

## 2023-03-09 DIAGNOSIS — S00412A Abrasion of left ear, initial encounter: Secondary | ICD-10-CM

## 2023-03-09 DIAGNOSIS — R1030 Lower abdominal pain, unspecified: Secondary | ICD-10-CM | POA: Diagnosis not present

## 2023-03-09 DIAGNOSIS — Z885 Allergy status to narcotic agent status: Secondary | ICD-10-CM | POA: Diagnosis not present

## 2023-03-09 DIAGNOSIS — Z86010 Personal history of colonic polyps: Secondary | ICD-10-CM | POA: Diagnosis not present

## 2023-03-09 DIAGNOSIS — Z8669 Personal history of other diseases of the nervous system and sense organs: Secondary | ICD-10-CM

## 2023-03-09 DIAGNOSIS — L659 Nonscarring hair loss, unspecified: Secondary | ICD-10-CM | POA: Diagnosis not present

## 2023-03-09 DIAGNOSIS — R079 Chest pain, unspecified: Secondary | ICD-10-CM | POA: Diagnosis not present

## 2023-03-09 DIAGNOSIS — R0602 Shortness of breath: Secondary | ICD-10-CM | POA: Diagnosis not present

## 2023-03-09 DIAGNOSIS — R000 Tachycardia, unspecified: Secondary | ICD-10-CM | POA: Diagnosis not present

## 2023-03-09 DIAGNOSIS — Z87891 Personal history of nicotine dependence: Secondary | ICD-10-CM | POA: Diagnosis not present

## 2023-03-09 DIAGNOSIS — Z881 Allergy status to other antibiotic agents status: Secondary | ICD-10-CM | POA: Diagnosis not present

## 2023-03-09 DIAGNOSIS — R1084 Generalized abdominal pain: Secondary | ICD-10-CM | POA: Diagnosis not present

## 2023-03-09 DIAGNOSIS — R42 Dizziness and giddiness: Secondary | ICD-10-CM | POA: Diagnosis not present

## 2023-03-09 NOTE — Progress Notes (Unsigned)
Established Patient Office Visit  Subjective   Patient ID: Jennifer [name], female    DOB: 1966/06/16  Age: 57 y.o. MRN: [id number]  Chief Complaint  Patient presents with   Follow-up         HPI Concerns today includes scheduling a time to have the skin tags removed particularly in the right axilla.  Wants to know if it is time to do another A1c yet.  She has not started the liquid metformin.  She has also noticed an increase in hair loss on the crown of her head.  Her hairdresser has noticed as well.  She feels like it is getting worse.  She has tried minoxidil in the past a couple times but says it gives her chest pain  She would like me to look in her left ear today its felt irritated and she noticed a little blood in it earlier this week.  She would like a new referral to urology in [facility] she was seen there previously and would like to get back in.  He would also like referral back to general surgery that she had consulted with before about the nodules in her upper inner arms.  Having some left lower quadrant pain from the diverticulitis.  She did consult with a new GI and they are going to try to put her on a low-dose of Doxy she has taken it in the past it just makes her stomach hurt but no allergic reaction so she is gena try it again at 50 mg twice a day.  They do want to eventually still do a colonoscopy but they want to get this under control first.  Still working with her therapist and counselor and we did get her GeneSight testing back to go over those results.   {History (Optional):23778}  ROS    Objective:     BP (!) 167/92   Pulse 74   Wt 233 lb (105.7 kg)   LMP 06/25/2013   SpO2 99%   BMI 42.62 kg/m  {Vitals History (Optional):23777}  Physical Exam   No results found for any visits on 03/09/23.  {Labs (Optional):23779}  The ASCVD Risk score (Arnett DK, et al., 2019) failed to calculate for the following reasons:   The patient has a prior  MI or stroke diagnosis    Assessment & Plan:   Problem List Items Addressed This Visit       Other   History of partial seizures    Definitely avoid Wellbutrin.      Depression with anxiety    Pression symptoms are actually fairly well-controlled right now struggling a little bit more with anxiety.  We did review the GeneSight testing and will follow a copy in her chart she was given a copy as well.  Continue working with her therapist or/counselor.  She is gena think about some options.      Other Visit Diagnoses     Diverticulitis    -  Primary   Abrasion of left ear, initial encounter       Hair loss       Relevant Orders   Ambulatory referral to Dermatology   Lipoma of upper extremity, unspecified laterality       Relevant Orders   Ambulatory referral to General Surgery      Diverticulitis-we will go ahead and start doxycycline.  And once symptoms are clear will get scheduled for colonoscopy.  Due for A1c after August 10.  Can definitely schedule  the skin tag removal at next office visit.  I spent 45 minutes on the day of the encounter to include pre-visit record review, face-to-face time with the patient and post visit ordering of test.  Sam confirms an abrasion in both inner ear canals.  Avoid over cleaning and irritation to the canal.   No follow-ups on file.    Nani Gasser, MD

## 2023-03-09 NOTE — Assessment & Plan Note (Signed)
Pression symptoms are actually fairly well-controlled right now struggling a little bit more with anxiety.  We did review the GeneSight testing and will follow a copy in her chart she was given a copy as well.  Continue working with her therapist or/counselor.  She is gena think about some options.

## 2023-03-09 NOTE — Assessment & Plan Note (Signed)
Definitely avoid Wellbutrin.

## 2023-03-10 DIAGNOSIS — R109 Unspecified abdominal pain: Secondary | ICD-10-CM | POA: Diagnosis not present

## 2023-03-10 DIAGNOSIS — T364X5A Adverse effect of tetracyclines, initial encounter: Secondary | ICD-10-CM | POA: Diagnosis not present

## 2023-03-10 DIAGNOSIS — R42 Dizziness and giddiness: Secondary | ICD-10-CM | POA: Diagnosis not present

## 2023-03-12 ENCOUNTER — Telehealth: Payer: Self-pay | Admitting: Family Medicine

## 2023-03-12 NOTE — Telephone Encounter (Signed)
Patient called to follow up on status of referrals. Also, requesting a urology referral. Please contact patirnt she has phone numbers for the referral providers.

## 2023-03-13 DIAGNOSIS — E611 Iron deficiency: Secondary | ICD-10-CM | POA: Diagnosis not present

## 2023-03-13 DIAGNOSIS — R079 Chest pain, unspecified: Secondary | ICD-10-CM | POA: Diagnosis not present

## 2023-03-13 DIAGNOSIS — I10 Essential (primary) hypertension: Secondary | ICD-10-CM | POA: Diagnosis not present

## 2023-03-13 DIAGNOSIS — I471 Supraventricular tachycardia, unspecified: Secondary | ICD-10-CM | POA: Diagnosis not present

## 2023-03-13 DIAGNOSIS — E782 Mixed hyperlipidemia: Secondary | ICD-10-CM | POA: Diagnosis not present

## 2023-03-13 DIAGNOSIS — R6889 Other general symptoms and signs: Secondary | ICD-10-CM | POA: Diagnosis not present

## 2023-03-13 DIAGNOSIS — Z87891 Personal history of nicotine dependence: Secondary | ICD-10-CM | POA: Diagnosis not present

## 2023-03-13 DIAGNOSIS — I4719 Other supraventricular tachycardia: Secondary | ICD-10-CM | POA: Diagnosis not present

## 2023-03-13 DIAGNOSIS — Z79899 Other long term (current) drug therapy: Secondary | ICD-10-CM | POA: Diagnosis not present

## 2023-03-13 DIAGNOSIS — E531 Pyridoxine deficiency: Secondary | ICD-10-CM | POA: Diagnosis not present

## 2023-03-13 DIAGNOSIS — R002 Palpitations: Secondary | ICD-10-CM | POA: Diagnosis not present

## 2023-03-14 DIAGNOSIS — K5792 Diverticulitis of intestine, part unspecified, without perforation or abscess without bleeding: Secondary | ICD-10-CM | POA: Diagnosis not present

## 2023-03-14 DIAGNOSIS — I10 Essential (primary) hypertension: Secondary | ICD-10-CM | POA: Diagnosis not present

## 2023-03-14 DIAGNOSIS — G35 Multiple sclerosis: Secondary | ICD-10-CM | POA: Diagnosis not present

## 2023-03-14 DIAGNOSIS — R000 Tachycardia, unspecified: Secondary | ICD-10-CM | POA: Diagnosis not present

## 2023-03-14 DIAGNOSIS — R002 Palpitations: Secondary | ICD-10-CM | POA: Diagnosis not present

## 2023-03-14 DIAGNOSIS — Z8542 Personal history of malignant neoplasm of other parts of uterus: Secondary | ICD-10-CM | POA: Diagnosis not present

## 2023-03-14 DIAGNOSIS — R1084 Generalized abdominal pain: Secondary | ICD-10-CM | POA: Diagnosis not present

## 2023-03-14 DIAGNOSIS — E785 Hyperlipidemia, unspecified: Secondary | ICD-10-CM | POA: Diagnosis not present

## 2023-03-14 DIAGNOSIS — G4730 Sleep apnea, unspecified: Secondary | ICD-10-CM | POA: Diagnosis not present

## 2023-03-14 DIAGNOSIS — K5732 Diverticulitis of large intestine without perforation or abscess without bleeding: Secondary | ICD-10-CM | POA: Diagnosis not present

## 2023-03-14 DIAGNOSIS — R1032 Left lower quadrant pain: Secondary | ICD-10-CM | POA: Diagnosis not present

## 2023-03-14 DIAGNOSIS — K219 Gastro-esophageal reflux disease without esophagitis: Secondary | ICD-10-CM | POA: Diagnosis not present

## 2023-03-14 DIAGNOSIS — K439 Ventral hernia without obstruction or gangrene: Secondary | ICD-10-CM | POA: Diagnosis not present

## 2023-03-14 DIAGNOSIS — I708 Atherosclerosis of other arteries: Secondary | ICD-10-CM | POA: Diagnosis not present

## 2023-03-14 DIAGNOSIS — E119 Type 2 diabetes mellitus without complications: Secondary | ICD-10-CM | POA: Diagnosis not present

## 2023-03-14 DIAGNOSIS — Z8669 Personal history of other diseases of the nervous system and sense organs: Secondary | ICD-10-CM | POA: Diagnosis not present

## 2023-03-15 DIAGNOSIS — R079 Chest pain, unspecified: Secondary | ICD-10-CM | POA: Diagnosis not present

## 2023-03-15 DIAGNOSIS — R030 Elevated blood-pressure reading, without diagnosis of hypertension: Secondary | ICD-10-CM | POA: Diagnosis not present

## 2023-03-15 DIAGNOSIS — I10 Essential (primary) hypertension: Secondary | ICD-10-CM | POA: Diagnosis not present

## 2023-03-15 IMAGING — DX DG CHEST 2V
2 series · 2 of 2 positions shown · non-contrast
Comparison: Chest radiograph dated 11/28/2021.

CLINICAL DATA: Chest pain and palpitation.

EXAM:
CHEST - 2 VIEW

[chest pa]
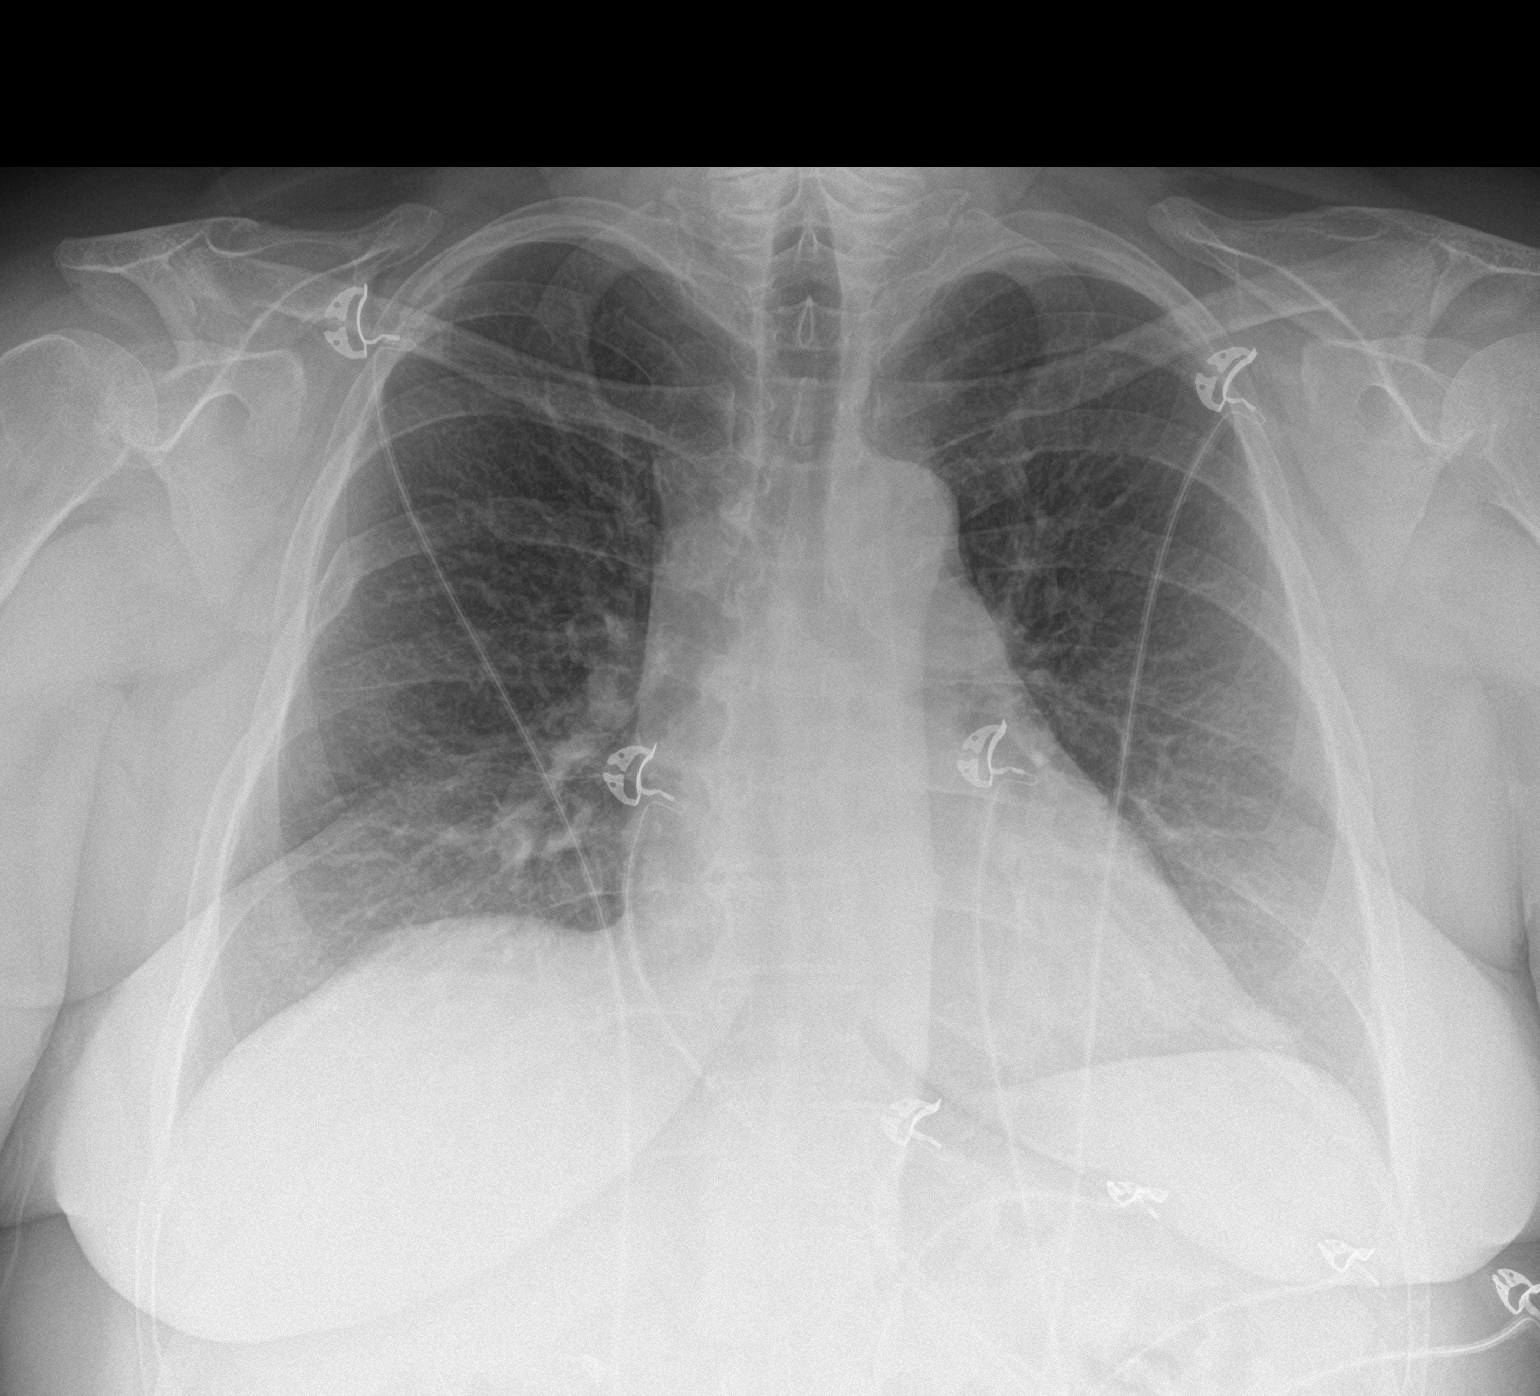

[chest lat]
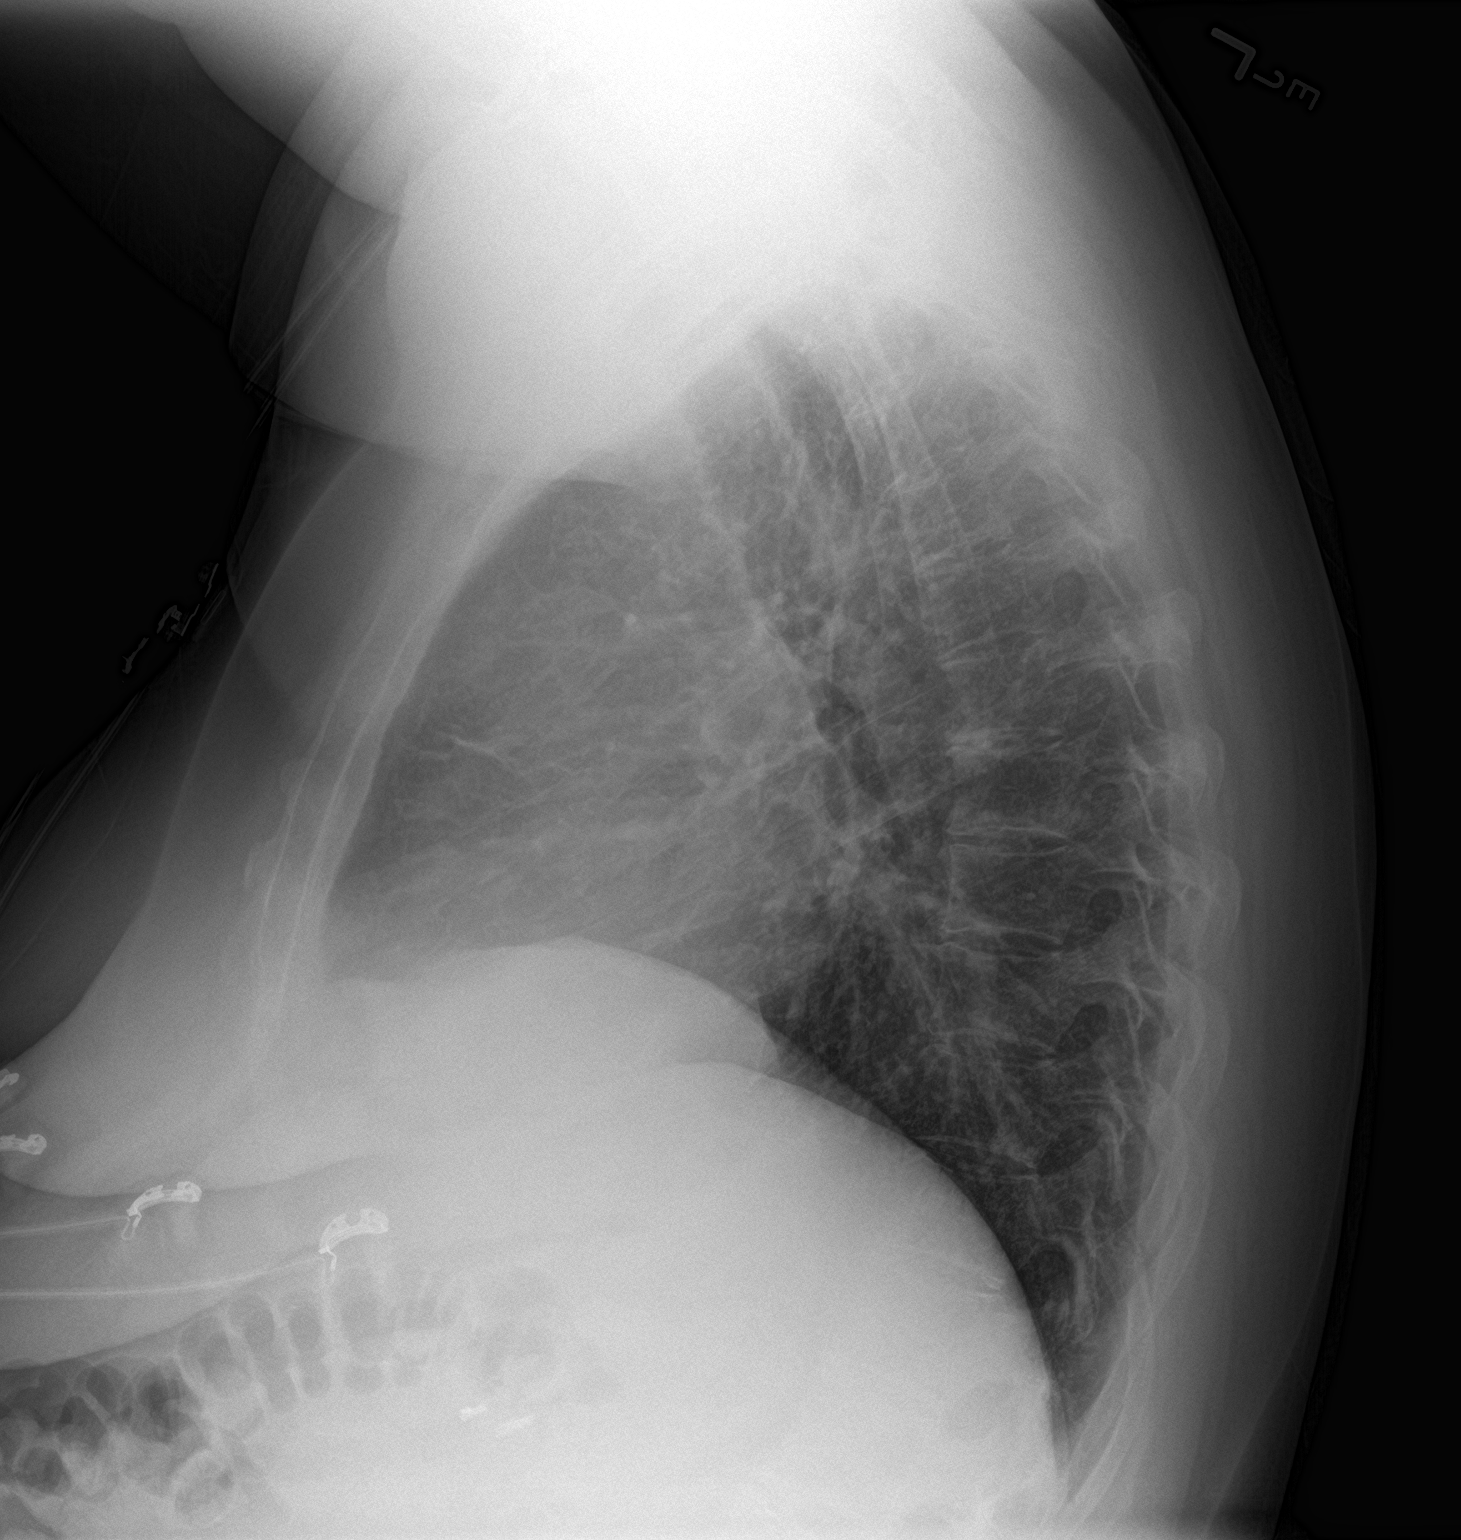

[2 of 2 positions shown; findings below may reference images not displayed]

FINDINGS: The heart size and mediastinal contours are within normal limits.
Both lungs are clear. The visualized skeletal structures are
unremarkable.
IMPRESSION: No active cardiopulmonary disease.

## 2023-03-17 NOTE — Patient Instructions (Incomplete)
No follow-ups on file.  Jennifer [name]  Chronic Cough, Shortness of breath Daily controller medication(s): none; does not tolerate any inhaled steroids  May use levoalbuterol rescue inhaler 1-2 puffs or nebulizer every 4 to 6 hours as needed for shortness of breath, chest tightness, coughing, and wheezing. Monitor frequency of use.  Sending in refills of nebulizer solution for xopenex. Can try using half the solution at a time and use every 2 to 4 hours as needed. Use if feeling air hard to breath out or lungs feel like balloons Breathing control goals:  Full participation in all desired activities (may need albuterol before activity) Albuterol use two times or less a week on average (not counting use with activity) Cough interfering with sleep two times or less a month Oral steroids no more than once a year No hospitalizations   Reflux Continue dietary and lifestyle modifications. Continue the regimen as prescribed by your gastroenterologist.  Keep a diary to track symptoms and food exposures. Skin testing today was negative to select foods. Copy of skin test given. Follow-up with GI  Chronic rhinitis-irritant Continue azelastine 1 spray each nostril daily as needed for congestion. Continue flunisolide nasal spray 2 sprays in each nostril once a day a day as needed for stuffy nose.  In the right nostril, point the applicator out toward the right ear. In the left nostril, point the applicator out toward the left ear Continue to perform nasal saline rinses 1-3 times a day as needed to help clean sinus tract - neil med sinus rinses DO NOT USE TAP WATER, can use distilled water or tap water that you boil for several minutes and then let cool prior to use.  You can also try NeilMed Sinus Rinse Premixed Refill Packets With Xylitol and Sinus Rinse Extra Strength Hypertonic Packets. Can use salt water gargles to help with phlegm in throat.   Chronic conjunctivitis: irritant Purchase a  rewetting eye drop such as systane, etc.  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

## 2023-03-19 ENCOUNTER — Telehealth: Payer: Self-pay | Admitting: Family

## 2023-03-19 ENCOUNTER — Ambulatory Visit (INDEPENDENT_AMBULATORY_CARE_PROVIDER_SITE_OTHER): Payer: Medicare HMO | Admitting: Family

## 2023-03-19 ENCOUNTER — Encounter: Payer: Self-pay | Admitting: Family

## 2023-03-19 VITALS — BP 122/72 | HR 87 | Temp 97.7°F | Resp 18

## 2023-03-19 DIAGNOSIS — J4530 Mild persistent asthma, uncomplicated: Secondary | ICD-10-CM | POA: Diagnosis not present

## 2023-03-19 DIAGNOSIS — K219 Gastro-esophageal reflux disease without esophagitis: Secondary | ICD-10-CM

## 2023-03-19 DIAGNOSIS — J310 Chronic rhinitis: Secondary | ICD-10-CM | POA: Diagnosis not present

## 2023-03-19 DIAGNOSIS — T781XXA Other adverse food reactions, not elsewhere classified, initial encounter: Secondary | ICD-10-CM

## 2023-03-19 DIAGNOSIS — E782 Mixed hyperlipidemia: Secondary | ICD-10-CM | POA: Diagnosis not present

## 2023-03-19 DIAGNOSIS — I10 Essential (primary) hypertension: Secondary | ICD-10-CM | POA: Diagnosis not present

## 2023-03-19 DIAGNOSIS — I471 Supraventricular tachycardia, unspecified: Secondary | ICD-10-CM | POA: Diagnosis not present

## 2023-03-19 DIAGNOSIS — E531 Pyridoxine deficiency: Secondary | ICD-10-CM | POA: Diagnosis not present

## 2023-03-19 DIAGNOSIS — R002 Palpitations: Secondary | ICD-10-CM | POA: Diagnosis not present

## 2023-03-19 DIAGNOSIS — I4719 Other supraventricular tachycardia: Secondary | ICD-10-CM | POA: Diagnosis not present

## 2023-03-19 DIAGNOSIS — J383 Other diseases of vocal cords: Secondary | ICD-10-CM | POA: Diagnosis not present

## 2023-03-19 DIAGNOSIS — Z79899 Other long term (current) drug therapy: Secondary | ICD-10-CM | POA: Diagnosis not present

## 2023-03-19 DIAGNOSIS — E611 Iron deficiency: Secondary | ICD-10-CM | POA: Diagnosis not present

## 2023-03-19 NOTE — Telephone Encounter (Signed)
Dr. Maurine Minister, Jennifer [name]'s cardiologist is wanting an ok from our office to start propanolol due to her asthma.   Thank you, Nehemiah Settle, FNP

## 2023-03-19 NOTE — Progress Notes (Signed)
[address] [place] [address] Dept: [phone]  FOLLOW UP NOTE  Patient ID: Jennifer [name], female    DOB: 08-Jun-1966  Age: 57 y.o. MRN: [id number] Date of Office Visit: 03/19/2023  Assessment  Chief Complaint: Allergy Testing  HPI [name] is a 57 year old female who presents today for skin testing to select foods.  She was last seen on March 02, 2023 by Dr. Maurine Minister.  She is currently taking clindamycin for diverticulitis.  She does report that her cardiologist would like for her allergist to approve her starting propranolol due to her having asthma.  Adverse food reaction: She reports that she used to always be able to eat cantaloupe, but now she will have a swollen throat and puffy face after eating cantaloupe..  She also reports that bell peppers cause the same symptoms along with difficulty swallowing and irritation from eating.  Garlic  "messes with her more."  She will feel thick in her mouth.  With peanuts and tree nuts she will also have the same feeling and will feel thick in her mouth.  She does have an EpiPen.  Asthma/chronic cough/shortness of breath.  She continues to use levalbuterol as needed.  She has not been able to tolerate any inhaled steroids.  She reports last night she had coughing and shortness of breath.  She wonders if she aspirated.  She felt like something went the wrong way. She denies fever or chills.  Chronic rhinitis: She has flunisolide to use as needed and saline to use as needed.  She reports postnasal drip at times and denies rhinorrhea and nasal congestion.  Chronic conjunctivitis: She denies itchy watery eyes today.  Reflux: She reports that she has a lot of indigestion and is currently taking famotidine.   Drug Allergies:  Allergies  Allergen Reactions   Azithromycin Shortness Of Breath    Lip swelling, SOB.      Ciprofloxacin Swelling    REACTION: tongue swells   Codeine Shortness Of Breath   Erythromycin Base Itching and  Rash   Sulfa Antibiotics Other (See Comments), Rash and Shortness Of Breath   Sulfasalazine Rash and Shortness Of Breath    Other reaction(s): Other (See Comments) Other reaction(s): SHORTNESS OF BREATH   Telmisartan Swelling    Tongue swelling, Micardis   Ace Inhibitors Cough   Aspirin Hives and Other (See Comments)    flushing   Atenolol Other (See Comments)    Squeezing chest sensation   Avelox [Moxifloxacin Hcl In Nacl] Itching        Beta Adrenergic Blockers Other (See Comments)    Feels like chest tightening labetalol, bystolic  Feels like chest tightening "Metoprolol"    Buspar [Buspirone] Other (See Comments)    Light headed   Butorphanol Tartrate Other (See Comments)    Patient aggitated   Cetirizine Hives and Rash        Clonidine Hcl     REACTION: makes blood pressure high   Cortisone     Feels like she is going crazy   Erythromycin Rash   Fentanyl Other (See Comments)    aggressive    Fluoxetine Hcl Other (See Comments)    REACTION: headaches   Ketorolac Tromethamine     jittery   Lidocaine Other (See Comments)    When it involves the throat,    Lisinopril Cough   Metoclopramide Hcl Other (See Comments)    Dystonic reaction   Midazolam Other (See Comments)    agitation  Slow to wake up   Montelukast Other (See Comments)    Singulair   Montelukast Sodium Other (See Comments)    DOES NOT REMEMBER  Don't remember-told not to take   Naproxen Other (See Comments)    FLUSHING Pt states she took Ibuprofen today (10/08/19)   Nitroglycerin Other (See Comments)    Unknown   Paroxetine Other (See Comments)    REACTION: headaches   Penicillins Rash   Pravastatin Other (See Comments)    Myalgias   Promethazine Other (See Comments)    Dystonic reaction   Promethazine Hcl Other (See Comments)    jittery   Quinolones Swelling and Rash   Serotonin Reuptake Inhibitors (Ssris) Other (See Comments)    Headache Effexor, prozac, zoloft,    Sertraline Hcl      REACTION: headaches   Stelazine [Trifluoperazine] Other (See Comments)    Dystonic reaction   Tobramycin Itching and Rash   Trifluoperazine Hcl     dystonic   Atrovent Nasal Spray [Ipratropium]     Tachycardia and shaking   Diltiazem Other (See Comments)    Chest pain   Iodinated Contrast Media     Other reaction(s): Other (See Comments) Chest heaviness/sob   Metoprolol     Other reaction(s): OTHER  Other Reaction(s): Dizziness   Polyethylene Glycol 3350     Other reaction(s): Laryngeal Edema (ALLERGY)   Propoxyphene    Adhesive [Tape] Rash    EKG monitor patches, some tapes Blisters, rash, itching, welts.   Butorphanol Anxiety    Patient agitated   Ceftriaxone Rash    rocephin   Iron Rash    Flushing with certain IV types   Metoclopramide Itching and Other (See Comments)    Dystonic reaction   Metronidazole Rash   Other Rash and Other (See Comments)    Uncoded Allergy. Allergen: steriods, Other Reaction: Not Assessed Other reaction(s): Flushing (ALLERGY/intolerance), GI Upset (intolerance), Hypertension (intolerance), Increased Heart Rate (intolerance), Mental Status Changes (intolerance), Other (See Comments), Tachycardia / Palpitations  (intolerance) Hospital gowns leave a rash.    Prednisone Anxiety and Palpitations   Prochlorperazine Anxiety    Compazine:  Dystonic reaction   Venlafaxine Anxiety   Zyrtec [Cetirizine Hcl] Rash    All over body    Review of Systems: Review of Systems  Constitutional:  Negative for chills and fever.  HENT:         Reports postnasal drip at times.  Denies rhinorrhea and nasal congestion  Respiratory:  Positive for cough and shortness of breath. Negative for wheezing.        Reports cough and shortness of breath yesterday evening and this morning.  Denies wheezing.  Gastrointestinal:        Reports indigestion a lot for which she takes famotidine  Skin:  Negative for itching and rash.  Neurological:  Negative for headaches.   Endo/Heme/Allergies:  Negative for environmental allergies.     Physical Exam: BP 122/72   Pulse 87   Temp 97.7 F (36.5 C) (Temporal)   Resp 18   LMP 06/25/2013   SpO2 97%    Physical Exam Exam conducted with a chaperone present (spouse present).  Constitutional:      Appearance: Normal appearance.  HENT:     Head: Normocephalic and atraumatic.     Comments: Pharynx normal. Eyes normal. Ears normal. Nose: bilateral lower turbinates mildly edematous and slightly erythematous with no drainage noted    Right Ear: Tympanic membrane, ear canal and external ear normal.  Left Ear: Tympanic membrane, ear canal and external ear normal.     Mouth/Throat:     Mouth: Mucous membranes are moist.     Pharynx: Oropharynx is clear.  Eyes:     Conjunctiva/sclera: Conjunctivae normal.  Cardiovascular:     Rate and Rhythm: Regular rhythm.     Heart sounds: Normal heart sounds.  Pulmonary:     Effort: Pulmonary effort is normal.     Breath sounds: Normal breath sounds.     Comments: Lungs clear to auscultation Musculoskeletal:     Cervical back: Neck supple.  Skin:    General: Skin is warm.  Neurological:     Mental Status: She is alert and oriented to person, place, and time.  Psychiatric:        Mood and Affect: Mood normal.        Behavior: Behavior normal.        Thought Content: Thought content normal.        Judgment: Judgment normal.     Diagnostics: FVC 2.27 L (75%), FEV1 1.96 L (82%).  Predicted FVC 3.02 L, predicted FEV1 2.40 L.  Spirometry indicates possible mild restriction.  Spirometry is consistent with previous spirometry.  Skin testing to select foods is negative with adequate controls.    Food Adult Perc - 03/19/23 0800     Time Antigen Placed 5784    Allergen Manufacturer Waynette Buttery    Location Back    Number of allergen test 15     Control-buffer 50% Glycerol Negative    Control-Histamine 3+    1. Peanut Negative    10. Cashew Negative    11. Walnut Food  Negative    12. Almond Negative    13. Hazelnut Negative    14. Pecan Food Negative    15. Pistachio Negative    16. [place] Nut Negative    17. Coconut Negative    45. Green Pepper Negative    63. Cantaloupe Negative    64. Watermelon Negative    70. Garlic Negative             Assessment and Plan: 1. Adverse food reaction, initial encounter   2. Mild persistent asthma without complication   3. Chronic rhinitis   4. Gastroesophageal reflux disease, unspecified whether esophagitis present   5. Vocal cord dysfunction     No orders of the defined types were placed in this encounter.   Patient Instructions  Adverse reaction due to food For now avoid peanuts/tree nuts, bell peppers, cantaloupe, watermelon and garlic Skin testing today was negative to select foods. Copy of skin test given. We will get lab work to complement your skin testing. We will call you with results once they are back Have access to epinephrine auto injector device at all times. Emergency Action Plan given and reviewed.  Chronic Cough, Shortness of breath Will hold off on chest x-ray for possible aspiration due to lungs sounding clear, no fever or chills and frequent imaging.  Let us know if symptoms worsen or you develop a fever. Daily controller medication(s): none; does not tolerate any inhaled steroids  May use levoalbuterol rescue inhaler 1-2 puffs or nebulizer every 4 to 6 hours as needed for shortness of breath, chest tightness, coughing, and wheezing. Monitor frequency of use.  Continue xopenex via nebulizer as needed every 4-6 hours as needed for cough,wheeze, tightness in chest, or shortness of breath. Can try using half the solution at a time and use every 2 to 4  hours as needed. Use if feeling air hard to breath out or lungs feel like balloons Breathing control goals:  Full participation in all desired activities (may need albuterol before activity) Albuterol use two times or less a week on  average (not counting use with activity) Cough interfering with sleep two times or less a month Oral steroids no more than once a year No hospitalizations   Reflux Continue dietary and lifestyle modifications. Continue the regimen as prescribed by your gastroenterologist.  Keep a diary to track symptoms and food exposures. Follow-up with GI  Chronic rhinitis-irritant Continue azelastine 1 spray each nostril daily as needed for congestion. Continue flunisolide nasal spray 2 sprays in each nostril once a day a day as needed for stuffy nose.  In the right nostril, point the applicator out toward the right ear. In the left nostril, point the applicator out toward the left ear Continue to perform nasal saline rinses 1-3 times a day as needed to help clean sinus tract - neil med sinus rinses DO NOT USE TAP WATER, can use distilled water or tap water that you boil for several minutes and then let cool prior to use.  You can also try NeilMed Sinus Rinse Premixed Refill Packets With Xylitol and Sinus Rinse Extra Strength Hypertonic Packets. Can use salt water gargles to help with phlegm in throat.   Chronic conjunctivitis: irritant Purchase a rewetting eye drop such as systane, etc.  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    Thank you for the opportunity to care for this patient.  Please do not hesitate to contact me with questions.  Nehemiah Settle, FNP [facility]

## 2023-03-19 NOTE — Telephone Encounter (Signed)
Pt informed and will let cardiologist know it is okay to proceed forward with propranol

## 2023-03-19 NOTE — Telephone Encounter (Signed)
Thank you Dr. Maurine Minister!  Please let Aisleigh know that Dr. Maurine Minister is ok with her being on propranol with her asthma

## 2023-03-20 ENCOUNTER — Ambulatory Visit: Payer: Medicare HMO | Admitting: Family Medicine

## 2023-03-20 DIAGNOSIS — R0602 Shortness of breath: Secondary | ICD-10-CM | POA: Diagnosis not present

## 2023-03-20 DIAGNOSIS — K5792 Diverticulitis of intestine, part unspecified, without perforation or abscess without bleeding: Secondary | ICD-10-CM | POA: Diagnosis not present

## 2023-03-20 DIAGNOSIS — Z20822 Contact with and (suspected) exposure to covid-19: Secondary | ICD-10-CM | POA: Diagnosis not present

## 2023-03-20 DIAGNOSIS — B349 Viral infection, unspecified: Secondary | ICD-10-CM | POA: Diagnosis not present

## 2023-03-21 DIAGNOSIS — R1032 Left lower quadrant pain: Secondary | ICD-10-CM | POA: Diagnosis not present

## 2023-03-21 DIAGNOSIS — Z794 Long term (current) use of insulin: Secondary | ICD-10-CM | POA: Diagnosis not present

## 2023-03-21 DIAGNOSIS — Z86010 Personal history of colonic polyps: Secondary | ICD-10-CM | POA: Diagnosis not present

## 2023-03-21 DIAGNOSIS — K5732 Diverticulitis of large intestine without perforation or abscess without bleeding: Secondary | ICD-10-CM | POA: Diagnosis not present

## 2023-03-21 DIAGNOSIS — K219 Gastro-esophageal reflux disease without esophagitis: Secondary | ICD-10-CM | POA: Diagnosis not present

## 2023-03-21 DIAGNOSIS — Z8542 Personal history of malignant neoplasm of other parts of uterus: Secondary | ICD-10-CM | POA: Diagnosis not present

## 2023-03-21 DIAGNOSIS — K760 Fatty (change of) liver, not elsewhere classified: Secondary | ICD-10-CM | POA: Diagnosis not present

## 2023-03-21 DIAGNOSIS — K5792 Diverticulitis of intestine, part unspecified, without perforation or abscess without bleeding: Secondary | ICD-10-CM | POA: Diagnosis not present

## 2023-03-21 DIAGNOSIS — R000 Tachycardia, unspecified: Secondary | ICD-10-CM | POA: Diagnosis not present

## 2023-03-21 DIAGNOSIS — E1165 Type 2 diabetes mellitus with hyperglycemia: Secondary | ICD-10-CM | POA: Diagnosis not present

## 2023-03-21 DIAGNOSIS — E2740 Unspecified adrenocortical insufficiency: Secondary | ICD-10-CM | POA: Diagnosis not present

## 2023-03-21 DIAGNOSIS — G35 Multiple sclerosis: Secondary | ICD-10-CM | POA: Diagnosis not present

## 2023-03-21 DIAGNOSIS — Z889 Allergy status to unspecified drugs, medicaments and biological substances status: Secondary | ICD-10-CM | POA: Diagnosis not present

## 2023-03-21 DIAGNOSIS — I10 Essential (primary) hypertension: Secondary | ICD-10-CM | POA: Diagnosis not present

## 2023-03-22 DIAGNOSIS — K5732 Diverticulitis of large intestine without perforation or abscess without bleeding: Secondary | ICD-10-CM | POA: Diagnosis not present

## 2023-03-22 DIAGNOSIS — R1032 Left lower quadrant pain: Secondary | ICD-10-CM | POA: Diagnosis not present

## 2023-03-22 DIAGNOSIS — G8929 Other chronic pain: Secondary | ICD-10-CM | POA: Diagnosis not present

## 2023-03-22 DIAGNOSIS — N289 Disorder of kidney and ureter, unspecified: Secondary | ICD-10-CM | POA: Diagnosis not present

## 2023-03-22 DIAGNOSIS — K5792 Diverticulitis of intestine, part unspecified, without perforation or abscess without bleeding: Secondary | ICD-10-CM | POA: Diagnosis not present

## 2023-03-22 DIAGNOSIS — R109 Unspecified abdominal pain: Secondary | ICD-10-CM | POA: Diagnosis not present

## 2023-03-22 DIAGNOSIS — I10 Essential (primary) hypertension: Secondary | ICD-10-CM | POA: Diagnosis not present

## 2023-03-22 DIAGNOSIS — Z8542 Personal history of malignant neoplasm of other parts of uterus: Secondary | ICD-10-CM | POA: Diagnosis not present

## 2023-03-22 DIAGNOSIS — Z90710 Acquired absence of both cervix and uterus: Secondary | ICD-10-CM | POA: Diagnosis not present

## 2023-03-22 DIAGNOSIS — I7090 Unspecified atherosclerosis: Secondary | ICD-10-CM | POA: Diagnosis not present

## 2023-03-22 DIAGNOSIS — K219 Gastro-esophageal reflux disease without esophagitis: Secondary | ICD-10-CM | POA: Diagnosis not present

## 2023-03-22 DIAGNOSIS — E1165 Type 2 diabetes mellitus with hyperglycemia: Secondary | ICD-10-CM | POA: Diagnosis not present

## 2023-03-23 ENCOUNTER — Telehealth: Payer: Self-pay

## 2023-03-23 ENCOUNTER — Telehealth (INDEPENDENT_AMBULATORY_CARE_PROVIDER_SITE_OTHER): Payer: Medicare HMO | Admitting: Family Medicine

## 2023-03-23 ENCOUNTER — Telehealth: Payer: Self-pay | Admitting: *Deleted

## 2023-03-23 ENCOUNTER — Encounter: Payer: Self-pay | Admitting: *Deleted

## 2023-03-23 DIAGNOSIS — R0789 Other chest pain: Secondary | ICD-10-CM | POA: Diagnosis not present

## 2023-03-23 DIAGNOSIS — I10 Essential (primary) hypertension: Secondary | ICD-10-CM

## 2023-03-23 DIAGNOSIS — R002 Palpitations: Secondary | ICD-10-CM | POA: Diagnosis not present

## 2023-03-23 DIAGNOSIS — R000 Tachycardia, unspecified: Secondary | ICD-10-CM | POA: Diagnosis not present

## 2023-03-23 DIAGNOSIS — T7840XA Allergy, unspecified, initial encounter: Secondary | ICD-10-CM | POA: Diagnosis not present

## 2023-03-23 DIAGNOSIS — Z79899 Other long term (current) drug therapy: Secondary | ICD-10-CM | POA: Diagnosis not present

## 2023-03-23 DIAGNOSIS — D500 Iron deficiency anemia secondary to blood loss (chronic): Secondary | ICD-10-CM | POA: Diagnosis not present

## 2023-03-23 NOTE — Transitions of Care (Post Inpatient/ED Visit) (Signed)
03/23/2023  Name: Jennifer [name] MRN: [id number] DOB: 01/24/1966  Today's TOC FU Call Status: Today's TOC FU Call Status:: Successful TOC FU Call Completed TOC FU Call Complete Date: 03/23/23  Transition Care Management Follow-up Telephone Call Date of Discharge: 03/22/23 Discharge Facility: Other ([facility]) Name of Other ([facility]) Discharge Facility: [facility] Type of Discharge: Inpatient Admission Primary Inpatient Discharge Diagnosis:: Acute diverticulitis How have you been since you were released from the hospital?: Worse ("I have just gotten out of a visit with my doctor and she has advised me to go back to the hospital because I am not doing any better; I am on my way now and don't have time to talk to you") Any questions or concerns?: Yes Patient Questions/Concerns:: Patient condition worse after hospital discharge yesterday: reports she has attended PCP HFU office visit (virtual) today and has been advised to return to hospital now- reports she is currently on her way to hospital; full TOC call deferred accordingly Patient Questions/Concerns Addressed: Other: (TOC call deferred according to patient report that she is on way back to hospital now)  Items Reviewed:    Medications Reviewed Today: Medications Reviewed Today     Reviewed by Michaela Corner, RN (Registered Nurse) on 03/23/23 at 1310  Med List Status: <None>   Medication Order Taking? Sig Documenting Provider Last Dose Status Informant  ACCU-CHEK GUIDE test strip [id number] No For testing blood sugars 2 times daily. E11.65 Agapito Games, MD Taking Active   Kipp Laurence MEDICATION [id number] No Medication Name: standing order BMP x 1 year Agapito Games, MD Taking Active   AMBULATORY Clent Demark MEDICATION [id number] No Medication Name: Standing order for CBC/diff (monthly) x 1 year Agapito Games, MD Taking Active   azelastine (ASTELIN) 0.1 % nasal spray [id number] No Place 1  spray into both nostrils daily as needed for rhinitis. Use in each nostril as directed Verlee Monte, MD Taking Active   betamethasone valerate ointment (VALISONE) 0.1 % [id number] No Apply 1 Application topically daily. Apply thin layer and then apply dye free and perfume free moisturizer on top. Agapito Games, MD Taking Active   blood glucose meter kit and supplies [id number] No Dispense based on patient and insurance preference. Use to check blood sugars twice daily E11.9 Agapito Games, MD Taking Active   EPINEPHrine (EPIPEN 2-PAK) 0.3 mg/0.3 mL IJ SOAJ injection [id number] No Inject 0.3 mg into the muscle as needed for anaphylaxis. Verlee Monte, MD Taking Active   famotidine (PEPCID) 40 MG tablet [id number] No Take 1 tablet (40 mg total) by mouth 2 (two) times daily. Agapito Games, MD Taking Active   levalbuterol [facility] HFA) 45 MCG/ACT inhaler [id number] No Inhale 1 puff into the lungs every 4 (four) hours as needed for wheezing or shortness of breath. Birder Robson, MD Taking Active   levalbuterol Pauline Aus) 1.25 MG/3ML nebulizer solution [id number] No Take 1.25 mg by nebulization every 4 (four) hours as needed for wheezing. Verlee Monte, MD Taking Active   Metformin HCl 500 MG/5ML SOLN [id number] No Take 5 mLs (500 mg total) by mouth 2 (two) times daily with a meal. Agapito Games, MD Taking Active   metoprolol tartrate (LOPRESSOR) 12.5 mg TABS tablet [id number] No Take 12.5 mg by mouth 2 (two) times daily. [provider] Taking Active   potassium chloride (KLOR-CON) 20 MEQ packet [id number] No Take 20 mEq by mouth daily. Agapito Games, MD Taking Active   Med  List Note Linford Arnold, Barbarann Ehlers, MD 08/29/21 1622):               Home Care and Equipment/Supplies:    Functional Questionnaire:    Follow up appointments reviewed:    Caryl Pina, RN, BSN, CCRN Alumnus RN CM Care Coordination/ Transition of Care- [facility] Care  Management [phone]: direct office

## 2023-03-23 NOTE — Progress Notes (Signed)
Pt stated that she is not feeling well. She reports that she was told that she should go to the ED due to her BP being elevated and her blood cultures being positive. She was also told that she should get a referral from Dr. Linford Arnold for a [facility] and a case worker for everything that is going on with her now.   She was given hydralazine at the hospital and wanted to know if Dr.Metheney would sent that in for her since her BP has been elevated and she doesn't know what she should do.   She informed me that she is actually on her way back to [facility] in [place] and is going to f/u on Tuesday. She mentioned that she would need a referral for a home health nurse and a case worker to come out to check her Blood pressures and help with medication management.

## 2023-03-23 NOTE — Telephone Encounter (Signed)
Patient stated she received lab results but I informed her our provider will call as soon as ALL the results have returned.  [phone]

## 2023-03-23 NOTE — Progress Notes (Signed)
    Virtual Visit via Video Note    Subjective:    CC:   Chief Complaint  Patient presents with   Follow-up    HPI: In summary since I last saw her she went to the emergency department on 726 for abdominal pain, 727 for concern about medication side effect from doxycycline.  She then saw cardiology for her SVT on July 30 with Dr. Sharlet Salina with [facility].  Then went to the emergency department on 731 for diverticulitis and abdominal pain.  She returned on August 1 for elevated blood pressure levels.  She was seen by allergy and asthma on August 5 and then by hematology oncology for iron deficiency on August 6.  She then went back to the ED on August 6 for viral syndrome type symptoms.  She was able to follow-up with GI on August 7 for her uncomplicated diverticulitis and left lower quadrant pain.  She [facility] Dr. Linton Flemings at [facility].   Past medical history, Surgical history, Family history not pertinant except as noted below, Social history, Allergies, and medications have been entered into the medical record, reviewed, and corrections made.    Objective:    General: Speaking clearly in complete sentences without any shortness of breath.  Alert and oriented x3.  Normal judgment. No apparent acute distress.    Impression and Recommendations:    Problem List Items Addressed This Visit   None   No orders of the defined types were placed in this encounter.   No orders of the defined types were placed in this encounter.    I discussed the assessment and treatment plan with the patient. The patient was provided an opportunity to ask questions and all were answered. The patient agreed with the plan and demonstrated an understanding of the instructions.   The patient was advised to call back or seek an in-person evaluation if the symptoms worsen or if the condition fails to improve as anticipated.   Nani Gasser, MD

## 2023-03-25 LAB — ALLERGEN WATERMELON: Allergen Watermelon IgE: <0.1 kU/L

## 2023-03-25 LAB — F263-IGE GREEN BELL PEPPER: Allergen Green Bell Pepper IgE: <0.1 kU/L

## 2023-03-25 LAB — ALLERGEN CANTALOUPE: Allergen Melon IgE: <0.1 kU/L

## 2023-03-27 ENCOUNTER — Telehealth (INDEPENDENT_AMBULATORY_CARE_PROVIDER_SITE_OTHER): Payer: Medicare HMO | Admitting: Family Medicine

## 2023-03-27 DIAGNOSIS — E118 Type 2 diabetes mellitus with unspecified complications: Secondary | ICD-10-CM

## 2023-03-27 DIAGNOSIS — R7982 Elevated C-reactive protein (CRP): Secondary | ICD-10-CM

## 2023-03-27 DIAGNOSIS — Z7984 Long term (current) use of oral hypoglycemic drugs: Secondary | ICD-10-CM

## 2023-03-27 DIAGNOSIS — I10 Essential (primary) hypertension: Secondary | ICD-10-CM | POA: Diagnosis not present

## 2023-03-27 DIAGNOSIS — M797 Fibromyalgia: Secondary | ICD-10-CM | POA: Diagnosis not present

## 2023-03-27 DIAGNOSIS — I471 Supraventricular tachycardia, unspecified: Secondary | ICD-10-CM | POA: Diagnosis not present

## 2023-03-27 MED ORDER — HYDRALAZINE HCL 10 MG PO TABS
10.0000 mg | ORAL_TABLET | Freq: Three times a day (TID) | ORAL | 1 refills | Status: DC
Start: 2023-03-27 — End: 2023-06-21

## 2023-03-27 NOTE — Assessment & Plan Note (Signed)
Still having occ tachy episodes after the ablation.

## 2023-03-27 NOTE — Assessment & Plan Note (Signed)
I really think she in a fibro flare with diffuse body aches, skin sensitivity and poor sleep quality lately. Feels better to lay down. Talked to [facility] about nursing coming in to help her get more active.

## 2023-03-27 NOTE — Assessment & Plan Note (Addendum)
BP still not well controlled. Was really high in ED.  On low dose BB. Did get hydralazine in the ED but didn't really reduce BP.  Will to try low dose hydralazine.

## 2023-03-27 NOTE — Progress Notes (Signed)
Virtual Visit via Video Note  I connected with Jennifer [name] on 03/27/23 at 11:30 AM EDT by a video enabled telemedicine application and verified that I am speaking with the correct person using two identifiers.   I discussed the limitations of evaluation and management by telemedicine and the availability of in person appointments. The patient expressed understanding and agreed to proceed.  Patient location: at home Provider location: in office  Subjective:    CC:  No chief complaint on file.   HPI: Jennifer [name] to the ED 03/23/23 for chest tightness and facial swelling. Started after had CT with contrast. Made her feel worse. Given benadryl. Resolution of diverticulitis on the CT. Still having some abdominal pain. Also notice cyst on left kidney but not change from 2021.did see GI while in the ED. Blood culture were negative. CRP was high but sed rate was normal   Has had some sinus drainage and mucous and throat clearing. Feeling achy.   BP at home sitting up 140s/80-90s. Legs feel very heavy. Legs have been very sensitive to pressure.   Feels like she is constantly sick and trying to find a new apartment. Has been stressed financially.  Husband been out of consistent work.   Mood - has missed a few therapy sessions.   Not sleeping well.   Needs a walker.     Component Ref Range & Units 4 d ago  Iron 50 - 212 ug/dL 38 Low   Transferrin 272 - 362 mg/dL 536  Total Iron Binding Capacity (TIBC) 290 - 518 ug/dL 644  Transferrin Saturation 15 - 45 % 13 Low      Past medical history, Surgical history, Family history not pertinant except as noted below, Social history, Allergies, and medications have been entered into the medical record, reviewed, and corrections made.    Objective:    General: Speaking clearly in complete sentences without any shortness of breath.  Alert and oriented x3.  Normal judgment. No apparent acute distress.    Impression and Recommendations:     Problem List Items Addressed This Visit       Cardiovascular and Mediastinum   SVT (supraventricular tachycardia)    Still having occ tachy episodes after the ablation.       Relevant Medications   hydrALAZINE (APRESOLINE) 10 MG tablet   Hypertension with intolerance to multiple antihypertensive drugs    BP still not well controlled. Was really high in ED.  On low dose BB. Did get hydralazine in the ED but didn't really reduce BP.  Will to try low dose hydralazine.       Relevant Medications   hydrALAZINE (APRESOLINE) 10 MG tablet   Other Relevant Orders   Ambulatory referral to Home Health     Endocrine   Diabetes mellitus type 2 with complications (HCC)   Relevant Orders   Ambulatory referral to Home Health     Other   Fibromyalgia    I really think she in a fibro flare with diffuse body aches, skin sensitivity and poor sleep quality lately. Feels better to lay down. Talked to [facility] about nursing coming in to help her get more active.        Relevant Orders   Ambulatory referral to Home Health   Other Visit Diagnoses     Elevated C-reactive protein (CRP)    -  Primary       Orders Placed This Encounter  Procedures   Ambulatory referral to Home Health  Referral Priority:   Routine    Referral Type:   Home Health Care    Referral Reason:   Specialty Services Required    Requested Specialty:   Home Health Services    Number of Visits Requested:   1    Meds ordered this encounter  Medications   hydrALAZINE (APRESOLINE) 10 MG tablet    Sig: Take 1 tablet (10 mg total) by mouth 3 (three) times daily.    Dispense:  90 tablet    Refill:  1    I discussed the assessment and treatment plan with the patient. The patient was provided an opportunity to ask questions and all were answered. The patient agreed with the plan and demonstrated an understanding of the instructions.   The patient was advised to call back or seek an in-person evaluation if the symptoms  worsen or if the condition fails to improve as anticipated.  I spent 40 minutes on the day of the encounter to include pre-visit record review, face-to-face time with the patient and post visit ordering of test.   Nani Gasser, MD

## 2023-03-28 DIAGNOSIS — I10 Essential (primary) hypertension: Secondary | ICD-10-CM | POA: Diagnosis not present

## 2023-03-28 DIAGNOSIS — R42 Dizziness and giddiness: Secondary | ICD-10-CM | POA: Diagnosis not present

## 2023-03-28 DIAGNOSIS — R0600 Dyspnea, unspecified: Secondary | ICD-10-CM | POA: Diagnosis not present

## 2023-03-28 DIAGNOSIS — Z9049 Acquired absence of other specified parts of digestive tract: Secondary | ICD-10-CM | POA: Diagnosis not present

## 2023-03-28 DIAGNOSIS — R079 Chest pain, unspecified: Secondary | ICD-10-CM | POA: Diagnosis not present

## 2023-03-28 DIAGNOSIS — R140 Abdominal distension (gaseous): Secondary | ICD-10-CM | POA: Diagnosis not present

## 2023-03-28 DIAGNOSIS — R1084 Generalized abdominal pain: Secondary | ICD-10-CM | POA: Diagnosis not present

## 2023-03-28 DIAGNOSIS — R1013 Epigastric pain: Secondary | ICD-10-CM | POA: Diagnosis not present

## 2023-03-28 DIAGNOSIS — R000 Tachycardia, unspecified: Secondary | ICD-10-CM | POA: Diagnosis not present

## 2023-03-31 NOTE — Progress Notes (Signed)
Please let Jennifer [name] know that her lab work came beck negative to green bell pepper, watermelon, cantaloupe,garlic, peanut, and tree nuts. Her skin testing on 03/19/23 was negative to these foods also.  Continue to avoid these foods for now due to recent reactions. Make sure to have access to your  epinephrine auto injector device at all times.

## 2023-04-02 DIAGNOSIS — R079 Chest pain, unspecified: Secondary | ICD-10-CM | POA: Diagnosis not present

## 2023-04-02 DIAGNOSIS — R000 Tachycardia, unspecified: Secondary | ICD-10-CM | POA: Diagnosis not present

## 2023-04-02 DIAGNOSIS — R0789 Other chest pain: Secondary | ICD-10-CM | POA: Diagnosis not present

## 2023-04-05 DIAGNOSIS — R002 Palpitations: Secondary | ICD-10-CM | POA: Diagnosis not present

## 2023-04-05 DIAGNOSIS — R519 Headache, unspecified: Secondary | ICD-10-CM | POA: Diagnosis not present

## 2023-04-05 DIAGNOSIS — Z20822 Contact with and (suspected) exposure to covid-19: Secondary | ICD-10-CM | POA: Diagnosis not present

## 2023-04-05 DIAGNOSIS — I10 Essential (primary) hypertension: Secondary | ICD-10-CM | POA: Diagnosis not present

## 2023-04-05 DIAGNOSIS — Z79899 Other long term (current) drug therapy: Secondary | ICD-10-CM | POA: Diagnosis not present

## 2023-04-05 DIAGNOSIS — Z87891 Personal history of nicotine dependence: Secondary | ICD-10-CM | POA: Diagnosis not present

## 2023-04-05 NOTE — Telephone Encounter (Signed)
Sent referral with OV Notes and Insurance to [facility] at [facility] [phone] and F [phone], they will call and schedule with patient.   Sending referral with OV Notes and Insurance to [facility] in [place] at [place] [phone] and F [phone], they will schedule with patient for good appointment time. Katha Hamming   Referral, clinical notes, demographics and copies of insurance cards have been faxed to [facility] at [phone]. Office will contact patient to schedule referral appointment.

## 2023-04-06 ENCOUNTER — Telehealth (INDEPENDENT_AMBULATORY_CARE_PROVIDER_SITE_OTHER): Payer: Medicare HMO | Admitting: Family Medicine

## 2023-04-06 DIAGNOSIS — F331 Major depressive disorder, recurrent, moderate: Secondary | ICD-10-CM

## 2023-04-06 DIAGNOSIS — E118 Type 2 diabetes mellitus with unspecified complications: Secondary | ICD-10-CM | POA: Diagnosis not present

## 2023-04-06 DIAGNOSIS — R002 Palpitations: Secondary | ICD-10-CM | POA: Diagnosis not present

## 2023-04-06 DIAGNOSIS — Z7984 Long term (current) use of oral hypoglycemic drugs: Secondary | ICD-10-CM

## 2023-04-06 DIAGNOSIS — R519 Headache, unspecified: Secondary | ICD-10-CM

## 2023-04-06 DIAGNOSIS — I10 Essential (primary) hypertension: Secondary | ICD-10-CM | POA: Diagnosis not present

## 2023-04-06 DIAGNOSIS — R4589 Other symptoms and signs involving emotional state: Secondary | ICD-10-CM | POA: Diagnosis not present

## 2023-04-06 DIAGNOSIS — R457 State of emotional shock and stress, unspecified: Secondary | ICD-10-CM | POA: Diagnosis not present

## 2023-04-06 DIAGNOSIS — Z20822 Contact with and (suspected) exposure to covid-19: Secondary | ICD-10-CM | POA: Diagnosis not present

## 2023-04-06 MED ORDER — BUSPIRONE HCL 7.5 MG PO TABS
7.5000 mg | ORAL_TABLET | Freq: Three times a day (TID) | ORAL | 0 refills | Status: DC
Start: 2023-04-06 — End: 2023-06-22

## 2023-04-06 NOTE — Assessment & Plan Note (Signed)
Reports blood pressures have been going up and down even though taking her medications regularly.

## 2023-04-06 NOTE — Assessment & Plan Note (Signed)
We did discuss options I am certainly open to trying a medication if she is. I am glad she has been able to connect back with a therapist as I think that is really helpful as well staying active as she can is also helpful.  Zoloft caused severe headaches,  Prozac caused headaches,  Paxil caused headaches,  Effexor caused a crawly sensation on her skin.     She would prefer to try something much more short acting.  At 1 time I did even prescribed BuSpar but she never actually took it but she says she is willing to consider it.

## 2023-04-06 NOTE — Assessment & Plan Note (Signed)
hasn't started the metformin yet.

## 2023-04-06 NOTE — Progress Notes (Signed)
Virtual Visit via Video Note  I connected with Jennifer [name] on 04/06/23 at  3:00 PM EDT by a video enabled telemedicine application and verified that I am speaking with the correct person using two identifiers.   I discussed the limitations of evaluation and management by telemedicine and the availability of in person appointments. The patient expressed understanding and agreed to proceed.  Patient location: at home Provider location: in office  Subjective:    CC:  No chief complaint on file.   HPI: She is really not been feeling poorly this week.  In fact she went to the emergency department twice yesterday she has been having significant headaches.  She is also had a lot of pressure in her sinuses across her forehead and most like a tight hat and radiating down towards the nasal bridge and around her left eye.  That is been present for couple of days as well she also had a sore throat for about 2 days they did test her for COVID in the ED and she was negative.  No cough or congestion.  Her head actually feels worse if she tries to lay down.  And yesterday when she went to the ED her blood pressure was greater than 200.  She feels like her stress and anxiety levels are extremely high right now to the point that she feels like she might be on the verge of a nervous breakdown.  She is having tearful episodes.  She has a lot of financial stressors.  Her great niece is now living with her and her husband.  She just moved in a couple days ago.  She has not been able to engage with her therapist as regularly over the last couple of months because of cancellations on the therapist in and on her and but that she was able to connect with her briefly earlier this week and has another appointment on Monday.  They had some connection issues.  Says that her sister calling her the other day while she was try to get blood work really triggered her anxiety.  She says she is at the point where she would  consider taking medication.  Hypertension-she is taking the hydralazine and the metoprolol currently.   Past medical history, Surgical history, Family history not pertinant except as noted below, Social history, Allergies, and medications have been entered into the medical record, reviewed, and corrections made.    Objective:    General: Speaking clearly in complete sentences without any shortness of breath.  Alert and oriented x3.  Normal judgment. No apparent acute distress.    Impression and Recommendations:    Problem List Items Addressed This Visit       Cardiovascular and Mediastinum   Hypertension with intolerance to multiple antihypertensive drugs    Reports blood pressures have been going up and down even though taking her medications regularly.        Endocrine   Diabetes mellitus type 2 with complications (HCC)    hasn't started the metformin yet.        Other   MDD (major depressive disorder), recurrent episode (HCC)    We did discuss options I am certainly open to trying a medication if she is. I am glad she has been able to connect back with a therapist as I think that is really helpful as well staying active as she can is also helpful.  Zoloft caused severe headaches,  Prozac caused headaches,  Paxil caused headaches,  Effexor caused a crawly sensation on her skin.     She would prefer to try something much more short acting.  At 1 time I did even prescribed BuSpar but she never actually took it but she says she is willing to consider it.      Relevant Medications   busPIRone (BUSPAR) 7.5 MG tablet   Headache - Primary   Other Visit Diagnoses     Anxiety about health       Relevant Medications   busPIRone (BUSPAR) 7.5 MG tablet       Aches-unclear etiology may certainly be viral especially since it has been concomitant with a sore throat for the last couple of days that she is negative for COVID it does not rule out other viruses may also be  related to the barometric pressure shift we have actually been having a lot of patients coming in with headaches over the last week or so.  She said years ago she is to take a medication called Midrin but it is no longer available.  She says yesterday Tylenol did not seem to help.  We could try it to see if she would be willing to try something like Fioricet but I would want to stay away from caffeinated products since she already has issues with blood pressure and tachycardia.  No orders of the defined types were placed in this encounter.   Meds ordered this encounter  Medications   busPIRone (BUSPAR) 7.5 MG tablet    Sig: Take 1 tablet (7.5 mg total) by mouth 3 (three) times daily.    Dispense:  90 tablet    Refill:  0     I discussed the assessment and treatment plan with the patient. The patient was provided an opportunity to ask questions and all were answered. The patient agreed with the plan and demonstrated an understanding of the instructions.   The patient was advised to call back or seek an in-person evaluation if the symptoms worsen or if the condition fails to improve as anticipated.  I spent 45 minutes on the day of the encounter to include pre-visit record review, face-to-face time with the patient and post visit ordering of test.   Nani Gasser, MD

## 2023-04-09 ENCOUNTER — Inpatient Hospital Stay: Payer: Medicare HMO | Attending: Psychiatry | Admitting: Psychiatry

## 2023-04-09 VITALS — BP 158/82 | HR 83 | Temp 98.8°F | Resp 17 | Wt 233.4 lb

## 2023-04-09 DIAGNOSIS — Z90710 Acquired absence of both cervix and uterus: Secondary | ICD-10-CM | POA: Diagnosis not present

## 2023-04-09 DIAGNOSIS — Z90722 Acquired absence of ovaries, bilateral: Secondary | ICD-10-CM | POA: Diagnosis not present

## 2023-04-09 DIAGNOSIS — R0602 Shortness of breath: Secondary | ICD-10-CM | POA: Diagnosis not present

## 2023-04-09 DIAGNOSIS — Z8542 Personal history of malignant neoplasm of other parts of uterus: Secondary | ICD-10-CM | POA: Diagnosis not present

## 2023-04-09 DIAGNOSIS — C519 Malignant neoplasm of vulva, unspecified: Secondary | ICD-10-CM | POA: Diagnosis not present

## 2023-04-09 DIAGNOSIS — Z87891 Personal history of nicotine dependence: Secondary | ICD-10-CM | POA: Diagnosis not present

## 2023-04-09 DIAGNOSIS — I10 Essential (primary) hypertension: Secondary | ICD-10-CM | POA: Diagnosis not present

## 2023-04-09 DIAGNOSIS — Z20822 Contact with and (suspected) exposure to covid-19: Secondary | ICD-10-CM | POA: Diagnosis not present

## 2023-04-09 MED ORDER — IMIQUIMOD 5 % EX CREA: TOPICAL_CREAM | CUTANEOUS | 0 refills | Status: DC
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
## 2023-04-09 NOTE — Patient Instructions (Signed)
It was a pleasure to see you in clinic today. - Start with apply once this week. Then apply twice next week. Then you can continue with 3 times a week until follow-up - Return visit planned for 2 months  Thank you very much for allowing me to provide care for you today.  I appreciate your confidence in choosing our Gynecologic Oncology team at [facility].  If you have any questions about your visit today please call our office or send [place] a MyChart message and we will get back to you as soon as possible.

## 2023-04-10 ENCOUNTER — Telehealth: Payer: Self-pay

## 2023-04-10 ENCOUNTER — Telehealth: Payer: Self-pay | Admitting: *Deleted

## 2023-04-10 DIAGNOSIS — R0600 Dyspnea, unspecified: Secondary | ICD-10-CM | POA: Diagnosis not present

## 2023-04-10 NOTE — Telephone Encounter (Signed)
Jennifer [name] called and is worried about her platelets. I did advise her it was in normal limits. She is worried she may need to go to oncology.   Platelet Count (PLT) 150 - 450 10*3/uL 155

## 2023-04-10 NOTE — Telephone Encounter (Signed)
Spoke with Jennifer [name] who called the office wanting to ask Dr. Alvester Morin a question in regards to her Platelet count.   Patient states that her platelet count has been dropping but it's still above 150. Pt was reassured that her platelet count is normal at 155.  Pt denies any unusual bleeding or bruising and advised patient to follow up with her hematologist Dr. Allyne Gee and that her message would be relayed to Dr. Alvester Morin as well.  Pt is aware to call the office with any concerns or questions and reminded of her follow up appt. With Dr. Alvester Morin on October 28 th. Pt verbalized understanding.

## 2023-04-11 NOTE — Telephone Encounter (Signed)
Patient informed about platelets being normal range and no referral needed. She wanted to ask Dr. Linford Arnold if she can break the Buspar in half  since she is just beginning the medication  just to see how she does with the medication and work her way up.??

## 2023-04-11 NOTE — Telephone Encounter (Signed)
No need for referral.  She is within the normal range.  And even if she was running in the 140s we would not refer her at that point.

## 2023-04-11 NOTE — Telephone Encounter (Signed)
Patient informed. 

## 2023-04-13 DIAGNOSIS — R0789 Other chest pain: Secondary | ICD-10-CM | POA: Diagnosis not present

## 2023-04-13 DIAGNOSIS — R000 Tachycardia, unspecified: Secondary | ICD-10-CM | POA: Diagnosis not present

## 2023-04-13 DIAGNOSIS — R531 Weakness: Secondary | ICD-10-CM | POA: Diagnosis not present

## 2023-04-13 DIAGNOSIS — J111 Influenza due to unidentified influenza virus with other respiratory manifestations: Secondary | ICD-10-CM | POA: Diagnosis not present

## 2023-04-13 DIAGNOSIS — M791 Myalgia, unspecified site: Secondary | ICD-10-CM | POA: Diagnosis not present

## 2023-04-13 DIAGNOSIS — Z20822 Contact with and (suspected) exposure to covid-19: Secondary | ICD-10-CM | POA: Diagnosis not present

## 2023-04-13 DIAGNOSIS — R42 Dizziness and giddiness: Secondary | ICD-10-CM | POA: Diagnosis not present

## 2023-04-13 DIAGNOSIS — I10 Essential (primary) hypertension: Secondary | ICD-10-CM | POA: Diagnosis not present

## 2023-04-14 DIAGNOSIS — R079 Chest pain, unspecified: Secondary | ICD-10-CM | POA: Diagnosis not present

## 2023-04-14 DIAGNOSIS — R000 Tachycardia, unspecified: Secondary | ICD-10-CM | POA: Diagnosis not present

## 2023-04-16 DIAGNOSIS — R0602 Shortness of breath: Secondary | ICD-10-CM | POA: Diagnosis not present

## 2023-04-16 DIAGNOSIS — R5383 Other fatigue: Secondary | ICD-10-CM | POA: Diagnosis not present

## 2023-04-16 DIAGNOSIS — R0789 Other chest pain: Secondary | ICD-10-CM | POA: Diagnosis not present

## 2023-04-16 DIAGNOSIS — M791 Myalgia, unspecified site: Secondary | ICD-10-CM | POA: Diagnosis not present

## 2023-04-16 DIAGNOSIS — R000 Tachycardia, unspecified: Secondary | ICD-10-CM | POA: Diagnosis not present

## 2023-04-16 DIAGNOSIS — R531 Weakness: Secondary | ICD-10-CM | POA: Diagnosis not present

## 2023-04-17 DIAGNOSIS — R000 Tachycardia, unspecified: Secondary | ICD-10-CM | POA: Diagnosis not present

## 2023-04-17 DIAGNOSIS — R9431 Abnormal electrocardiogram [ECG] [EKG]: Secondary | ICD-10-CM | POA: Diagnosis not present

## 2023-04-19 ENCOUNTER — Encounter: Payer: Self-pay | Admitting: Psychiatry

## 2023-04-20 ENCOUNTER — Telehealth: Payer: Self-pay | Admitting: Family Medicine

## 2023-04-20 ENCOUNTER — Ambulatory Visit (INDEPENDENT_AMBULATORY_CARE_PROVIDER_SITE_OTHER): Payer: Medicare HMO | Admitting: Family Medicine

## 2023-04-20 VITALS — BP 154/79 | HR 90 | Ht 62.0 in | Wt 233.0 lb

## 2023-04-20 DIAGNOSIS — M7062 Trochanteric bursitis, left hip: Secondary | ICD-10-CM | POA: Diagnosis not present

## 2023-04-20 DIAGNOSIS — I10 Essential (primary) hypertension: Secondary | ICD-10-CM | POA: Diagnosis not present

## 2023-04-20 DIAGNOSIS — R42 Dizziness and giddiness: Secondary | ICD-10-CM

## 2023-04-20 DIAGNOSIS — Z7984 Long term (current) use of oral hypoglycemic drugs: Secondary | ICD-10-CM | POA: Diagnosis not present

## 2023-04-20 DIAGNOSIS — E118 Type 2 diabetes mellitus with unspecified complications: Secondary | ICD-10-CM

## 2023-04-20 DIAGNOSIS — L989 Disorder of the skin and subcutaneous tissue, unspecified: Secondary | ICD-10-CM

## 2023-04-20 NOTE — Telephone Encounter (Signed)
Patient called in wanting her referral for Dermatology sent to [facility], Close to the office and not deep in [place] or [place]. Does not care for [facility] office. Please Advise.

## 2023-04-20 NOTE — Progress Notes (Signed)
Established Patient Office Visit  Subjective   Patient ID: Jennifer [name], female    DOB: 1966/03/16  Age: 57 y.o. MRN: [id number]  Chief Complaint  Patient presents with   Medical Management of Chronic Issues    HPI  She would like to have her B vitamins checked.  He has not felt well in the last couple of weeks.  She has a circular skin lesion on her right facial cheek that she would like me to look at today.  Also have been having some left lateral hip pain.  Specific trauma.  It has been painful to sleep on that hip.  Sometimes it radiates slightly upward and posteriorly sometimes the pain radiates down.    ROS    Objective:     BP (!) 154/79   Pulse 90   Ht 5\' 2"  (1.575 m)   Wt 233 lb (105.7 kg)   LMP 06/25/2013   SpO2 95%   BMI 42.62 kg/m    Physical Exam Vitals and nursing note reviewed.  Constitutional:      Appearance: Normal appearance.  HENT:     Head: Normocephalic and atraumatic.  Eyes:     Conjunctiva/sclera: Conjunctivae normal.  Cardiovascular:     Rate and Rhythm: Normal rate and regular rhythm.  Pulmonary:     Effort: Pulmonary effort is normal.     Breath sounds: Normal breath sounds.  Musculoskeletal:     Comments: Tender over the left greater trochanter.  Tender down the outer thigh.  Skin:    General: Skin is warm and dry.     Comments: Lesion on right facial cheek is somewhat circular and just slightly raised but no scale.  Neurological:     Mental Status: She is alert.  Psychiatric:        Mood and Affect: Mood normal.      No results found for any visits on 04/20/23.    The ASCVD Risk score (Arnett DK, et al., 2019) failed to calculate for the following reasons:   The patient has a prior MI or stroke diagnosis    Assessment & Plan:   Problem List Items Addressed This Visit       Cardiovascular and Mediastinum   Hypertension with intolerance to multiple antihypertensive drugs - Primary    She reports that her  blood pressures have been a little bit better lately though it still quite elevated today.      Relevant Orders   Vitamin B1   Vitamin B6   B12     Endocrine   Diabetes mellitus type 2 with complications (HCC)   Relevant Orders   Vitamin B1   Vitamin B6   B12   Other Visit Diagnoses     Trochanteric bursitis of left hip       Skin lesion       Relevant Orders   Ambulatory referral to Dermatology   Dizziness           Referral to dermatology for the skin lesion on her right facial cheek.  She also like them to go ahead and address her scalp issues as well.  Trochanteric bursitis left -handout provided.  Recommend ice and gentle stretches.  If not improving then let us know.  No follow-ups on file.   I spent 40 minutes on the day of the encounter to include pre-visit record review, face-to-face time with the patient and post visit ordering of test.   Nani Gasser, MD

## 2023-04-21 DIAGNOSIS — R079 Chest pain, unspecified: Secondary | ICD-10-CM | POA: Diagnosis not present

## 2023-04-21 DIAGNOSIS — R0789 Other chest pain: Secondary | ICD-10-CM | POA: Diagnosis not present

## 2023-04-21 DIAGNOSIS — J029 Acute pharyngitis, unspecified: Secondary | ICD-10-CM | POA: Diagnosis not present

## 2023-04-21 DIAGNOSIS — Z20822 Contact with and (suspected) exposure to covid-19: Secondary | ICD-10-CM | POA: Diagnosis not present

## 2023-04-21 DIAGNOSIS — R000 Tachycardia, unspecified: Secondary | ICD-10-CM | POA: Diagnosis not present

## 2023-04-23 ENCOUNTER — Telehealth: Payer: Self-pay | Admitting: Family Medicine

## 2023-04-23 DIAGNOSIS — I471 Supraventricular tachycardia, unspecified: Secondary | ICD-10-CM | POA: Diagnosis not present

## 2023-04-23 DIAGNOSIS — R000 Tachycardia, unspecified: Secondary | ICD-10-CM | POA: Diagnosis not present

## 2023-04-23 DIAGNOSIS — E531 Pyridoxine deficiency: Secondary | ICD-10-CM | POA: Diagnosis not present

## 2023-04-23 DIAGNOSIS — R002 Palpitations: Secondary | ICD-10-CM | POA: Diagnosis not present

## 2023-04-23 DIAGNOSIS — I10 Essential (primary) hypertension: Secondary | ICD-10-CM | POA: Diagnosis not present

## 2023-04-23 DIAGNOSIS — E611 Iron deficiency: Secondary | ICD-10-CM | POA: Diagnosis not present

## 2023-04-23 DIAGNOSIS — E782 Mixed hyperlipidemia: Secondary | ICD-10-CM | POA: Diagnosis not present

## 2023-04-23 DIAGNOSIS — I4719 Other supraventricular tachycardia: Secondary | ICD-10-CM | POA: Diagnosis not present

## 2023-04-23 DIAGNOSIS — Z79899 Other long term (current) drug therapy: Secondary | ICD-10-CM | POA: Diagnosis not present

## 2023-04-23 NOTE — Telephone Encounter (Signed)
error 

## 2023-04-23 NOTE — Assessment & Plan Note (Signed)
She reports that her blood pressures have been a little bit better lately though it still quite elevated today.

## 2023-04-24 DIAGNOSIS — Z20822 Contact with and (suspected) exposure to covid-19: Secondary | ICD-10-CM | POA: Diagnosis not present

## 2023-04-24 DIAGNOSIS — R070 Pain in throat: Secondary | ICD-10-CM | POA: Diagnosis not present

## 2023-04-24 DIAGNOSIS — R531 Weakness: Secondary | ICD-10-CM | POA: Diagnosis not present

## 2023-04-24 DIAGNOSIS — Z87891 Personal history of nicotine dependence: Secondary | ICD-10-CM | POA: Diagnosis not present

## 2023-04-24 DIAGNOSIS — D696 Thrombocytopenia, unspecified: Secondary | ICD-10-CM | POA: Diagnosis not present

## 2023-04-24 DIAGNOSIS — R0981 Nasal congestion: Secondary | ICD-10-CM | POA: Diagnosis not present

## 2023-04-24 DIAGNOSIS — J029 Acute pharyngitis, unspecified: Secondary | ICD-10-CM | POA: Diagnosis not present

## 2023-04-25 NOTE — Telephone Encounter (Signed)
Referral, clinical notes, demographics and copies of insurance cards  have been faxed to [facility] at [phone]. Office will contact patient to schedule referral appointment.

## 2023-04-30 DIAGNOSIS — R079 Chest pain, unspecified: Secondary | ICD-10-CM | POA: Diagnosis not present

## 2023-04-30 DIAGNOSIS — R002 Palpitations: Secondary | ICD-10-CM | POA: Diagnosis not present

## 2023-04-30 DIAGNOSIS — R9431 Abnormal electrocardiogram [ECG] [EKG]: Secondary | ICD-10-CM | POA: Diagnosis not present

## 2023-05-01 DIAGNOSIS — R9431 Abnormal electrocardiogram [ECG] [EKG]: Secondary | ICD-10-CM | POA: Diagnosis not present

## 2023-05-02 DIAGNOSIS — R197 Diarrhea, unspecified: Secondary | ICD-10-CM | POA: Diagnosis not present

## 2023-05-02 DIAGNOSIS — R1032 Left lower quadrant pain: Secondary | ICD-10-CM | POA: Diagnosis not present

## 2023-05-02 DIAGNOSIS — K4090 Unilateral inguinal hernia, without obstruction or gangrene, not specified as recurrent: Secondary | ICD-10-CM | POA: Diagnosis not present

## 2023-05-02 DIAGNOSIS — I700 Atherosclerosis of aorta: Secondary | ICD-10-CM | POA: Diagnosis not present

## 2023-05-02 DIAGNOSIS — K760 Fatty (change of) liver, not elsewhere classified: Secondary | ICD-10-CM | POA: Diagnosis not present

## 2023-05-02 DIAGNOSIS — K5900 Constipation, unspecified: Secondary | ICD-10-CM | POA: Diagnosis not present

## 2023-05-02 DIAGNOSIS — K449 Diaphragmatic hernia without obstruction or gangrene: Secondary | ICD-10-CM | POA: Diagnosis not present

## 2023-05-02 DIAGNOSIS — K5790 Diverticulosis of intestine, part unspecified, without perforation or abscess without bleeding: Secondary | ICD-10-CM | POA: Diagnosis not present

## 2023-05-02 DIAGNOSIS — K429 Umbilical hernia without obstruction or gangrene: Secondary | ICD-10-CM | POA: Diagnosis not present

## 2023-05-04 ENCOUNTER — Ambulatory Visit (INDEPENDENT_AMBULATORY_CARE_PROVIDER_SITE_OTHER): Payer: Medicare HMO | Admitting: Family Medicine

## 2023-05-04 ENCOUNTER — Encounter: Payer: Self-pay | Admitting: Family Medicine

## 2023-05-04 VITALS — BP 151/84 | HR 107 | Ht 62.0 in | Wt 234.0 lb

## 2023-05-04 DIAGNOSIS — I4719 Other supraventricular tachycardia: Secondary | ICD-10-CM

## 2023-05-04 DIAGNOSIS — F331 Major depressive disorder, recurrent, moderate: Secondary | ICD-10-CM

## 2023-05-04 DIAGNOSIS — R002 Palpitations: Secondary | ICD-10-CM

## 2023-05-04 DIAGNOSIS — F418 Other specified anxiety disorders: Secondary | ICD-10-CM

## 2023-05-04 DIAGNOSIS — R000 Tachycardia, unspecified: Secondary | ICD-10-CM

## 2023-05-04 MED ORDER — FAMOTIDINE 40 MG PO TABS: 40.0000 mg | ORAL_TABLET | Freq: Two times a day (BID) | ORAL | 1 refills | Status: DC

## 2023-05-04 NOTE — Assessment & Plan Note (Signed)
She has not tried the BuSpar yet.  Will go ahead and place referral to behavioral health again right now she has someone he is willing to do virtual pro bono but it has been inconsistent so we can still work on try to find her somebody little bit more consistent.
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
## 2023-05-04 NOTE — Assessment & Plan Note (Signed)
She continues to work on [employer].  Her activity unfortunately is quite limited right now because she gets very short of breath with activities.

## 2023-05-04 NOTE — Assessment & Plan Note (Signed)
Encouraged her to consider trying the BuSpar.

## 2023-05-04 NOTE — Assessment & Plan Note (Signed)
Mild tachycardia today.  EKG was stable and reassuring please see note below.  Give her reassurance make

## 2023-05-08 ENCOUNTER — Telehealth: Payer: Self-pay | Admitting: Family Medicine

## 2023-05-08 ENCOUNTER — Encounter: Payer: Self-pay | Admitting: Family Medicine

## 2023-05-08 ENCOUNTER — Ambulatory Visit (INDEPENDENT_AMBULATORY_CARE_PROVIDER_SITE_OTHER): Payer: Medicare HMO | Admitting: Family Medicine

## 2023-05-08 VITALS — BP 165/85 | HR 82 | Temp 98.7°F

## 2023-05-08 DIAGNOSIS — R002 Palpitations: Secondary | ICD-10-CM | POA: Diagnosis not present

## 2023-05-08 DIAGNOSIS — I10 Essential (primary) hypertension: Secondary | ICD-10-CM | POA: Diagnosis not present

## 2023-05-08 DIAGNOSIS — E611 Iron deficiency: Secondary | ICD-10-CM | POA: Diagnosis not present

## 2023-05-08 DIAGNOSIS — J0110 Acute frontal sinusitis, unspecified: Secondary | ICD-10-CM | POA: Diagnosis not present

## 2023-05-08 DIAGNOSIS — I471 Supraventricular tachycardia, unspecified: Secondary | ICD-10-CM | POA: Diagnosis not present

## 2023-05-08 DIAGNOSIS — J029 Acute pharyngitis, unspecified: Secondary | ICD-10-CM | POA: Diagnosis not present

## 2023-05-08 DIAGNOSIS — E782 Mixed hyperlipidemia: Secondary | ICD-10-CM | POA: Diagnosis not present

## 2023-05-08 DIAGNOSIS — I4719 Other supraventricular tachycardia: Secondary | ICD-10-CM | POA: Diagnosis not present

## 2023-05-08 DIAGNOSIS — Z79899 Other long term (current) drug therapy: Secondary | ICD-10-CM | POA: Diagnosis not present

## 2023-05-08 DIAGNOSIS — E531 Pyridoxine deficiency: Secondary | ICD-10-CM | POA: Diagnosis not present

## 2023-05-08 LAB — POC COVID19 BINAXNOW: SARS Coronavirus 2 Ag: NEGATIVE

## 2023-05-08 LAB — POCT RAPID STREP A (OFFICE): Rapid Strep A Screen: NEGATIVE

## 2023-05-08 MED ORDER — TRIAMCINOLONE ACETONIDE 55 MCG/ACT NA AERO: 2.0000 | INHALATION_SPRAY | Freq: Every day | NASAL | 12 refills | Status: DC

## 2023-05-08 MED ORDER — CLINDAMYCIN HCL 75 MG PO CAPS: 75.0000 mg | ORAL_CAPSULE | Freq: Four times a day (QID) | ORAL | 0 refills | Status: DC

## 2023-05-08 NOTE — Telephone Encounter (Signed)
Jennifer [name] states the nasal spray is not covered under her insurance plan. She wanted to know if she could take the Azelastine she had left over from the allergist. She states her nose is raw.

## 2023-05-08 NOTE — Telephone Encounter (Signed)
Patient called she request a call back about her nasal spray that was prescribed today she has questions about it. Phone number is [phone]

## 2023-05-08 NOTE — Telephone Encounter (Signed)
Yes, she can use the azelastine.  If she does want to switch to a steroid product we could always try the generic of Flonase 2.  But I think she should try the 1 that she already has.

## 2023-05-08 NOTE — Progress Notes (Signed)
Acute Office Visit  Subjective:     Patient ID: Jennifer [name], female    DOB: 1965-12-04, 57 y.o.   MRN: [id number]  Chief Complaint  Patient presents with   Sore Throat    HPI Patient is in today for sinus symptoms.  She says earlier in the week she started having some headaches.  Then it developed into nasal congestion and drainage and now she is getting some drainage into her throat and her throat feels sore and she feels like it starting to drain into her chest.  Mucus has been beige and green.  No fevers or chills she is just felt lightheaded and has not felt great.  ROS      Objective:    BP (!) 165/85   Pulse 82   Temp 98.7 F (37.1 C)   LMP 06/25/2013   SpO2 96%    Physical Exam Constitutional:      Appearance: Normal appearance.  HENT:     Head: Normocephalic and atraumatic.     Right Ear: Tympanic membrane, ear canal and external ear normal. There is no impacted cerumen.     Left Ear: Tympanic membrane, ear canal and external ear normal. There is no impacted cerumen.     Nose: Nose normal.     Mouth/Throat:     Pharynx: Oropharynx is clear.  Eyes:     Conjunctiva/sclera: Conjunctivae normal.  Cardiovascular:     Rate and Rhythm: Normal rate and regular rhythm.  Pulmonary:     Effort: Pulmonary effort is normal.     Breath sounds: Normal breath sounds.  Musculoskeletal:     Cervical back: Neck supple. No tenderness.  Lymphadenopathy:     Cervical: No cervical adenopathy.  Skin:    General: Skin is warm and dry.  Neurological:     Mental Status: She is alert and oriented to person, place, and time.  Psychiatric:        Mood and Affect: Mood normal.     Results for orders placed or performed in visit on 05/08/23  POCT rapid strep A  Result Value Ref Range   Rapid Strep A Screen Negative Negative  POC COVID-19  Result Value Ref Range   SARS Coronavirus 2 Ag Negative Negative        Assessment & Plan:   Problem List Items Addressed  This Visit   None Visit Diagnoses     Sore throat    -  Primary   Relevant Orders   POCT rapid strep A (Completed)   POC COVID-19 (Completed)   Acute non-recurrent frontal sinusitis       Relevant Medications   triamcinolone (NASACORT) 55 MCG/ACT AERO nasal inhaler   clindamycin (CLEOCIN) 75 MG capsule   Other Relevant Orders   POC COVID-19 (Completed)      Acute sinusitis - will tx with clinda since she has so many allergies and intolerance. Encouraged her to consider Omnicef.  Restart nasal steroid spray.   Meds ordered this encounter  Medications   triamcinolone (NASACORT) 55 MCG/ACT AERO nasal inhaler    Sig: Place 2 sprays into the nose daily.    Dispense:  1 each    Refill:  12   clindamycin (CLEOCIN) 75 MG capsule    Sig: Take 1 capsule (75 mg total) by mouth 4 (four) times daily.    Dispense:  20 capsule    Refill:  0    No follow-ups on file.  Nani Gasser, MD

## 2023-05-09 NOTE — Telephone Encounter (Signed)
Spoke with pharmacy-  nasacort not a covered med under patient insurance . But cost is $21.99  Attempted call to patient. Left a voice mail message requesting a return call.

## 2023-05-09 NOTE — Telephone Encounter (Signed)
Pt returned call

## 2023-05-09 NOTE — Telephone Encounter (Signed)
LVM for patient to call office to inform her of below.

## 2023-05-10 DIAGNOSIS — R9389 Abnormal findings on diagnostic imaging of other specified body structures: Secondary | ICD-10-CM | POA: Diagnosis not present

## 2023-05-10 DIAGNOSIS — R059 Cough, unspecified: Secondary | ICD-10-CM | POA: Diagnosis not present

## 2023-05-10 DIAGNOSIS — R000 Tachycardia, unspecified: Secondary | ICD-10-CM | POA: Diagnosis not present

## 2023-05-10 DIAGNOSIS — Z20822 Contact with and (suspected) exposure to covid-19: Secondary | ICD-10-CM | POA: Diagnosis not present

## 2023-05-10 DIAGNOSIS — J069 Acute upper respiratory infection, unspecified: Secondary | ICD-10-CM | POA: Diagnosis not present

## 2023-05-10 DIAGNOSIS — I700 Atherosclerosis of aorta: Secondary | ICD-10-CM | POA: Diagnosis not present

## 2023-05-10 DIAGNOSIS — R0602 Shortness of breath: Secondary | ICD-10-CM | POA: Diagnosis not present

## 2023-05-10 NOTE — Telephone Encounter (Signed)
Patient would like Flonase called in but wanted to let Dr. Linford Arnold know that  she tried this in the past and caused some nasal bleeding.

## 2023-05-11 ENCOUNTER — Encounter: Payer: Self-pay | Admitting: Family Medicine

## 2023-05-11 ENCOUNTER — Other Ambulatory Visit: Payer: Self-pay

## 2023-05-11 ENCOUNTER — Ambulatory Visit (INDEPENDENT_AMBULATORY_CARE_PROVIDER_SITE_OTHER): Payer: Medicare HMO | Admitting: Internal Medicine

## 2023-05-11 ENCOUNTER — Encounter: Payer: Self-pay | Admitting: Internal Medicine

## 2023-05-11 VITALS — BP 136/86 | HR 106 | Temp 97.9°F | Resp 20 | Ht 64.0 in | Wt 230.0 lb

## 2023-05-11 DIAGNOSIS — R0602 Shortness of breath: Secondary | ICD-10-CM | POA: Diagnosis not present

## 2023-05-11 DIAGNOSIS — R1319 Other dysphagia: Secondary | ICD-10-CM

## 2023-05-11 DIAGNOSIS — J4530 Mild persistent asthma, uncomplicated: Secondary | ICD-10-CM

## 2023-05-11 DIAGNOSIS — J3089 Other allergic rhinitis: Secondary | ICD-10-CM | POA: Diagnosis not present

## 2023-05-11 DIAGNOSIS — Z87891 Personal history of nicotine dependence: Secondary | ICD-10-CM | POA: Diagnosis not present

## 2023-05-11 DIAGNOSIS — R09A2 Foreign body sensation, throat: Secondary | ICD-10-CM | POA: Diagnosis not present

## 2023-05-11 MED ORDER — FLUTICASONE PROPIONATE 50 MCG/ACT NA SUSP: 1.0000 | Freq: Every day | NASAL | 6 refills | Status: DC

## 2023-05-11 MED ORDER — LEVALBUTEROL TARTRATE 45 MCG/ACT IN AERO: 1.0000 | INHALATION_SPRAY | RESPIRATORY_TRACT | 1 refills | Status: DC | PRN

## 2023-05-11 MED ORDER — OMEPRAZOLE 40 MG PO CPDR: 40.0000 mg | DELAYED_RELEASE_CAPSULE | Freq: Every day | ORAL | 5 refills | Status: DC

## 2023-05-11 NOTE — Telephone Encounter (Signed)
No pneumonia which is very reassuring. Is home oxygen ok? I think she has a meter

## 2023-05-11 NOTE — Addendum Note (Signed)
Addended by: Nani Gasser D on: 05/11/2023 08:54 AM   Modules accepted: Orders

## 2023-05-11 NOTE — Telephone Encounter (Signed)
Patient informed. She states her home oxygen is 97% most days but sometimes goes down to 92%.

## 2023-05-11 NOTE — Patient Instructions (Signed)
Esophageal Dysphagia Recent onset of severe pain and sensation of food getting stuck. History of esophageal spasm and sliding hiatal hernia. -Start Omeprazole to reduce inflammation and improve symptoms. 40 mg daily-take 30 minutes prior to meal - if food becomes stuck, recommend ED evaluation. -Follow-up for Gastroenterology for endoscopy.  Upper Respiratory Symptoms Chronic sinusitis with increased mucus production and cough. -Continue Flonase as directed by Dr. Eppie Gibson. 1 spray in each nostril daily -Consider use of Atrovent nasal spray as needed for excessive mucus production.  Hyperglycemia Recent high blood glucose readings, patient reports feeling unwell. -Advise patient to monitor blood glucose levels closely and follow up with primary care provider.  Multiple Hernias Noted on recent CT scan. -Continue monitoring, no acute intervention needed at this time.  Asthma Chronic condition, patient reports occasional use of Xopenex inhaler. -Continue current management, no changes to treatment plan at this time.  Follow-up -Check in with Gastroenterology for endoscopy. -Continue monitoring blood glucose levels and follow up with primary care provider. -Continue current asthma management.

## 2023-05-11 NOTE — Telephone Encounter (Signed)
Okay, just use 1 spray in each nostril do not due to.  And if the nose does start to bleed just give it a break for 3 or 4 days and then okay to restart if needed.  Meds ordered this encounter  Medications   fluticasone (FLONASE) 50 MCG/ACT nasal spray    Sig: Place 1 spray into both nostrils daily.    Dispense:  16 g    Refill:  6

## 2023-05-11 NOTE — Telephone Encounter (Signed)
Patient informed. 

## 2023-05-11 NOTE — Progress Notes (Signed)
FOLLOW UP Date of Service/Encounter:  05/11/23  Subjective:  Jennifer [name] (DOB: 1966/06/15) is a 57 y.o. female who [facility] in re-evaluation of the following: Acute visit for respiratory distress History obtained from: chart review and patient.  For Review, LV was on 03/19/2023 with Nehemiah Settle, FNP seen for  follow-up visit for adverse food reactions, asthma and chronic shortness of breath, chronic rhinitis and reflux . Allergy testing to select foods was obtained at this visit and was negative to peanuts tree nuts, coconut, green pepper, cantaloupe, watermelon and garlic Labs were also obtained and were negative to green bell pepper, watermelon, cantaloupe, garlic, peanut and tree nuts. She was still advised to avoid given her adverse reactions when eating these foods despite negative testing. - FEV1 82%, nonobstructive ratio, restriction noted  She is well-known to our clinic, seen most frequently for shortness of breath.  She has multiple medication intolerances and cannot tolerate ICS as well as most allergy medications.  She has a complex medical history. uses Xopenex infrequently. ---------------------------------------------- Today presents for follow-up. Discussed the use of AI scribe software for clinical note transcription with the patient, who gave verbal consent to proceed.  History of Present Illness   The patient, with a history of esophageal spasm, diverticulitis, and tachycardia, presents with a chief complaint of difficulty swallowing and breathing for over a week. She describes the sensation as if food is getting stuck in the upper airway, causing severe pain and a feeling of breathlessness. This symptom was particularly severe on the day of the consultation, following the consumption of toast. The patient also reports a sensation of something still being stuck in the throat, even after the food has passed down. She is on  famotidine but has had issues with PPI in the past. She is willing to try omeprazole capsules. She had adverse side effects from Nexium.  In addition to the swallowing difficulty, the patient has been experiencing high blood sugar levels, and chronic coughing. She also reports a sensation of mucus build-up in the throat, which she has been trying to manage with over-the-counter medication. However, the patient has been advised by another physician that guaifenesin could exacerbate her tachycardia so she is avoiding. She is currently using Flonase 1 spray daily, azelastine nasal spray as needed. She has not used her Atrovent recently.  The patient has been experiencing these symptoms in the context of multiple health issues, including a sliding hiatal hernia and several other hernias. She has also been dealing with chronic sinus inflammation, which has been resistant to treatment with nasal sprays. The patient's recent chest X-ray reportedly showed an elevated right hemidiaphragm, a finding that was unfamiliar to her.  The patient's symptoms have been causing significant distress, leading to multiple hospital visits and cancellations of scheduled procedures, such as an EGD and colonoscopy. She expresses frustration with the difficulty in accessing timely care and the perceived lack of belief in her symptoms by healthcare providers. She is using a home monitoring device for her breathing which when reviewed with her shows she has been in a normal range for the past several days.      Chart Review: ED encounter listed for 05/10/2023 though cannot find written encounter notes.  There are some telephone notes with patient's primary care regarding starting Flonase despite history of epistaxis.  It was recommend that patient use 1 spray in each nostril and take breaks if nosebleeds occur. There is an ED  encounter on 05/02/2023 for abdominal pain-CT showed no acute noncontrast CT findings in abdomen or pelvis.   Diverticulosis without diverticulitis present, constipation noted, small hiatal hernia noted, umbilical and inguinal fat hernias noted as well as aortic atherosclerosis and chronic hepatic steatosis. Chest x-ray 04/16/2023-read as lungs are clear.  Images not available for review  All medications reviewed by clinical staff and updated in chart. No new pertinent medical or surgical history except as noted in HPI.  ROS: All others negative except as noted per HPI.   Objective:  BP 136/86 (BP Location: Right Arm, Patient Position: Sitting, Cuff Size: Large)   Pulse (!) 106   Temp 97.9 F (36.6 C) (Temporal)   Resp 20   Ht 5\' 4"  (1.626 m)   Wt 230 lb (104.3 kg)   LMP 06/25/2013   SpO2 97%   BMI 39.48 kg/m  Body mass index is 39.48 kg/m. Physical Exam: General Appearance:  Alert, cooperative, no distress, appears stated age  Head:  Normocephalic, without obvious abnormality, atraumatic  Eyes:  Conjunctiva clear, EOM's intact  Ears EACs normal bilaterally and normal TMs bilaterally  Nose: Nares normal, normal mucosa and no visible anterior polyps  Throat: Lips, tongue normal; teeth and gums normal, normal posterior oropharynx  Neck: Supple, symmetrical  Lungs:   clear to auscultation bilaterally, Respirations unlabored, no coughing  Heart:  regular rate and rhythm and no murmur, Appears well perfused  Extremities: No edema  Skin: Skin color, texture, turgor normal and no rashes or lesions on visualized portions of skin  Neurologic: No gross deficits   Labs:  Lab Orders  No laboratory test(s) ordered today    Spirometry:  Tracings reviewed. Her effort: Good reproducible efforts. FVC: 2.22L FEV1: 1.86L, 78% predicted FEV1/FVC ratio: 0.84 Interpretation: Spirometry consistent with possible restrictive disease.  Similar to previous spirometry's Please see scanned spirometry results for details.   Assessment/Plan   Assessment and Plan    Esophageal Dysphagia Recent onset  of severe pain and sensation of food getting stuck. History of esophageal spasm and sliding hiatal hernia. -Start Omeprazole to reduce inflammation and improve symptoms. 40 mg daily-take 30 minutes prior to meal - if food becomes stuck, recommend ED evaluation. -Follow-up for Gastroenterology for endoscopy.  Upper Respiratory Symptoms Chronic sinusitis with increased mucus production and cough. -Continue Flonase as directed by Dr. Eppie Gibson. 1 spray in each nostril daily -Consider use of Atrovent nasal spray as needed for excessive mucus production.  Hyperglycemia Recent high blood glucose readings, patient reports feeling unwell. -Advise patient to monitor blood glucose levels closely and follow up with primary care provider.  Multiple Hernias Noted on recent CT scan. -Continue monitoring, no acute intervention needed at this time.  Asthma Chronic condition, patient reports occasional use of Xopenex inhaler. -Continue current management, no changes to treatment plan at this time.  Follow-up -Check in with Gastroenterology for endoscopy. -Continue monitoring blood glucose levels and follow up with primary care provider. -Continue current asthma management.        Other: none  Tonny Bollman, MD  [facility]

## 2023-05-12 DIAGNOSIS — E669 Obesity, unspecified: Secondary | ICD-10-CM | POA: Diagnosis not present

## 2023-05-12 DIAGNOSIS — R1310 Dysphagia, unspecified: Secondary | ICD-10-CM | POA: Diagnosis not present

## 2023-05-14 ENCOUNTER — Telehealth: Payer: Self-pay

## 2023-05-14 ENCOUNTER — Telehealth: Payer: Self-pay | Admitting: Family Medicine

## 2023-05-14 NOTE — Telephone Encounter (Signed)
Ms.Kovarik called office leaving a voicemail, stating she needs to reschedule her October appointment with Dr. Alvester Morin to the end of November due to delayed starting of medication. She has requested reschedule to the end of November.   Appointment has been rescheduled to 07/12/23 @ 2:00. Pt aware via voicemail

## 2023-05-14 NOTE — Telephone Encounter (Signed)
Patient is asking if she could split the appointment between her and her niece for October 18th if any way possible for 20 minutes per appt time so that the appt for her niece Jennifer [name] dob 03-07-2006

## 2023-05-15 ENCOUNTER — Telehealth: Payer: Self-pay

## 2023-05-15 DIAGNOSIS — R000 Tachycardia, unspecified: Secondary | ICD-10-CM | POA: Diagnosis not present

## 2023-05-15 DIAGNOSIS — Z87891 Personal history of nicotine dependence: Secondary | ICD-10-CM | POA: Diagnosis not present

## 2023-05-15 DIAGNOSIS — R0789 Other chest pain: Secondary | ICD-10-CM | POA: Diagnosis not present

## 2023-05-15 DIAGNOSIS — R9431 Abnormal electrocardiogram [ECG] [EKG]: Secondary | ICD-10-CM | POA: Diagnosis not present

## 2023-05-15 DIAGNOSIS — E271 Primary adrenocortical insufficiency: Secondary | ICD-10-CM | POA: Diagnosis not present

## 2023-05-15 DIAGNOSIS — R002 Palpitations: Secondary | ICD-10-CM | POA: Diagnosis not present

## 2023-05-15 DIAGNOSIS — R079 Chest pain, unspecified: Secondary | ICD-10-CM | POA: Diagnosis not present

## 2023-05-15 DIAGNOSIS — I10 Essential (primary) hypertension: Secondary | ICD-10-CM | POA: Diagnosis not present

## 2023-05-15 NOTE — Telephone Encounter (Signed)
Patient advised.

## 2023-05-15 NOTE — Telephone Encounter (Signed)
If she has spots inher vision I think she needs to see an eye doc. Will likely need a retinal exam. Does she have an eye doc?

## 2023-05-15 NOTE — Telephone Encounter (Signed)
Jennifer [name] called and states she had a throbbing pain going up her back to her neck with throbbing headache. She also had spots in her vision on her left eye. This happened twice, once yesterday and once the day before. Does she need to be seen sooner than Friday?

## 2023-05-18 ENCOUNTER — Encounter: Payer: Self-pay | Admitting: Family Medicine

## 2023-05-18 ENCOUNTER — Telehealth: Payer: Self-pay

## 2023-05-18 ENCOUNTER — Other Ambulatory Visit (HOSPITAL_COMMUNITY): Payer: Self-pay

## 2023-05-18 ENCOUNTER — Ambulatory Visit (INDEPENDENT_AMBULATORY_CARE_PROVIDER_SITE_OTHER): Payer: Medicare HMO | Admitting: Family Medicine

## 2023-05-18 VITALS — BP 154/77 | HR 84 | Ht 64.0 in | Wt 233.5 lb

## 2023-05-18 DIAGNOSIS — J4540 Moderate persistent asthma, uncomplicated: Secondary | ICD-10-CM

## 2023-05-18 DIAGNOSIS — G35 Multiple sclerosis: Secondary | ICD-10-CM | POA: Diagnosis not present

## 2023-05-18 DIAGNOSIS — K220 Achalasia of cardia: Secondary | ICD-10-CM

## 2023-05-18 DIAGNOSIS — H539 Unspecified visual disturbance: Secondary | ICD-10-CM

## 2023-05-18 DIAGNOSIS — E785 Hyperlipidemia, unspecified: Secondary | ICD-10-CM | POA: Diagnosis not present

## 2023-05-18 DIAGNOSIS — I10 Essential (primary) hypertension: Secondary | ICD-10-CM

## 2023-05-18 DIAGNOSIS — E118 Type 2 diabetes mellitus with unspecified complications: Secondary | ICD-10-CM | POA: Diagnosis not present

## 2023-05-18 DIAGNOSIS — R000 Tachycardia, unspecified: Secondary | ICD-10-CM | POA: Diagnosis not present

## 2023-05-18 DIAGNOSIS — R002 Palpitations: Secondary | ICD-10-CM | POA: Diagnosis not present

## 2023-05-18 NOTE — Assessment & Plan Note (Signed)
Blood pressure is unfortunately not well-controlled today.  She is currently taking the metoprolol.

## 2023-05-18 NOTE — Assessment & Plan Note (Signed)
Still reports episodes where she will feel off balance and she will feel like her legs are very heavy.  She has not gotten back in with Dr. Loleta Chance to confirm this diagnosis which she was suspicious of several years ago but she is continues to have these episodes of severe fatigue and weakness as well.

## 2023-05-18 NOTE — Assessment & Plan Note (Signed)
Reminded her to use her albuterol if she is feeling tight or short of breath to see if it provides some relief even if she is not sure if it is more from swelling in the throat area versus her asthma.

## 2023-05-18 NOTE — Assessment & Plan Note (Signed)
Last A1c was 6.5 she is not currently on metformin and she has been fearful to start it.  Will continue to monitor carefully and plan to recheck her A1c next month if she can keep her A1c close to 6.5 then we can hold off on medication continue to work on healthy diet.

## 2023-05-18 NOTE — Telephone Encounter (Signed)
Pharmacy Patient Advocate Encounter   Received notification from CoverMyMeds that prior authorization for Levalbuterol Tartrate 45MCG/ACT aerosol is required/requested.   Insurance verification completed.   The patient is insured through [employer] .   Per test claim: PA required; PA submitted to [facility] via CoverMyMeds Key/confirmation #/EOC [id number] Status is pending

## 2023-05-18 NOTE — Progress Notes (Signed)
Established Patient Office Visit  Subjective   Patient ID: Jennifer [name], female    DOB: June 30, 1966  Age: 57 y.o. MRN: [id number]  Chief Complaint  Patient presents with   Foot Pain    Patient c/o right foot discomfort x 1 week - on pad of foot.   questions    Questions regarding x-ray taken at hospital   strange sensation    Patient c/o unusual sensation in back at base of spine up to neck and then shooting sensation like fireworks inside head  - last 5 minutes x last week- and then spots in eye ( left ) that lasted one day.     HPI  Right foot pain x 1 week.  She says it feels like a dry patch on her foot and most like it is pulling or tearing or cracking when she moves her feet.  She says she is using a diabetic moisturizer on her foot.  Is mostly on the pad of her right foot at the base close to her fourth and fifth toes.  Having some low back pain and radiated up her back, neck and to her head. Then had shooting pain  that last about 2 min but had spots in her vision in her left eye.  The spots in her vision lasted all day and a little bit into the next day.  Her last [facility] eye exam was about a year ago.  She did have some questions about plain film x-ray that was taken at the hospital.  BP is still high.  Is also still having episodes of right-sided chest pain and tachycardia she felt it happened last night and she said she really wanted to go the emergency room but knew she was coming here today.  She also has a cardiology appointment later this afternoon and so is planning on going to that.  She has been sent a Zio patch but has not placed it yet.  Wanted let me know that she had an episode where she had eaten a piece of bread and then it got stuck in her midesophagus she has had choking issues in the past always in the upper throat area but never in her mid upper esophageal area.  Dr. Loman Chroman, GI, has ordered a swallowing study.  Was seen in the ED on September 27 for  this.  She also had a chest x-ray for cough and shortness of breath done on September 26.  It was otherwise negative except for a elevated right hemidiaphragm.    ROS    Objective:     BP (!) 154/77   Pulse 84   Ht 5\' 4"  (1.626 m)   Wt 233 lb 8 oz (105.9 kg)   LMP 06/25/2013   SpO2 98%   BMI 40.08 kg/m    Physical Exam Vitals and nursing note reviewed.  Constitutional:      Appearance: Normal appearance.  HENT:     Head: Normocephalic and atraumatic.  Eyes:     Conjunctiva/sclera: Conjunctivae normal.  Cardiovascular:     Rate and Rhythm: Normal rate and regular rhythm.  Pulmonary:     Effort: Pulmonary effort is normal.     Breath sounds: Normal breath sounds.  Skin:    General: Skin is warm and dry.  Neurological:     Mental Status: She is alert.  Psychiatric:        Mood and Affect: Mood normal.      No results found for  any visits on 05/18/23.    The ASCVD Risk score (Arnett DK, et al., 2019) failed to calculate for the following reasons:   The patient has a prior MI or stroke diagnosis    Assessment & Plan:   Problem List Items Addressed This Visit       Cardiovascular and Mediastinum   Hypertension with intolerance to multiple antihypertensive drugs    Blood pressure is unfortunately not well-controlled today.  She is currently taking the metoprolol.        Respiratory   Not well controlled moderate persistent asthma    Reminded her to use her albuterol if she is feeling tight or short of breath to see if it provides some relief even if she is not sure if it is more from swelling in the throat area versus her asthma.        Digestive   Achalasia of esophagus    Swallowing study scheduled with Dr. Loman Chroman.        Endocrine   Diabetes mellitus type 2 with complications (HCC)    Last A1c was 6.5 she is not currently on metformin and she has been fearful to start it.  Will continue to monitor carefully and plan to recheck her A1c next  month if she can keep her A1c close to 6.5 then we can hold off on medication continue to work on healthy diet.        Nervous and Auditory   Multiple sclerosis (HCC)    Still reports episodes where she will feel off balance and she will feel like her legs are very heavy.  She has not gotten back in with Dr. Loleta Chance to confirm this diagnosis which she was suspicious of several years ago but she is continues to have these episodes of severe fatigue and weakness as well.      Other Visit Diagnoses     Vision changes    -  Primary       Dyshidrotic eczema-did encourage her to start applying a thin layer of her steroid cream and then putting her moisturizer on top every night after her shower she can even mix the 2 together and put it in a little Tupperware container and then apply it daily.  I encouraged her to give [place] a call and let us know if we need to refill the steroid cream she was pretty sure she still had some on file.  Vision change-I am concerned about the spots in her vision in her left eye that lasted most 2 days I am not sure how to exactly correlate it to the pain that shot up her back and her neck.  But I would recommend highly that she see her eye doctor to make sure that the retina and vitreous are looking healthy and undamaged.  I spent 40 minutes on the day of the encounter to include pre-visit record review, face-to-face time with the patient and post visit ordering of test.   Return if symptoms worsen or fail to improve.    Nani Gasser, MD

## 2023-05-18 NOTE — Assessment & Plan Note (Signed)
Swallowing study scheduled with Dr. Loman Chroman.

## 2023-05-20 DIAGNOSIS — E1165 Type 2 diabetes mellitus with hyperglycemia: Secondary | ICD-10-CM | POA: Diagnosis not present

## 2023-05-20 DIAGNOSIS — Z79899 Other long term (current) drug therapy: Secondary | ICD-10-CM | POA: Diagnosis not present

## 2023-05-20 DIAGNOSIS — R002 Palpitations: Secondary | ICD-10-CM | POA: Diagnosis not present

## 2023-05-20 DIAGNOSIS — E669 Obesity, unspecified: Secondary | ICD-10-CM | POA: Diagnosis not present

## 2023-05-20 DIAGNOSIS — Z5982 Transportation insecurity: Secondary | ICD-10-CM | POA: Diagnosis not present

## 2023-05-20 DIAGNOSIS — Z87891 Personal history of nicotine dependence: Secondary | ICD-10-CM | POA: Diagnosis not present

## 2023-05-21 ENCOUNTER — Other Ambulatory Visit (HOSPITAL_COMMUNITY): Payer: Self-pay

## 2023-05-21 DIAGNOSIS — Z0389 Encounter for observation for other suspected diseases and conditions ruled out: Secondary | ICD-10-CM | POA: Diagnosis not present

## 2023-05-21 DIAGNOSIS — Z03821 Encounter for observation for suspected ingested foreign body ruled out: Secondary | ICD-10-CM | POA: Diagnosis not present

## 2023-05-21 DIAGNOSIS — R1310 Dysphagia, unspecified: Secondary | ICD-10-CM | POA: Diagnosis not present

## 2023-05-21 NOTE — Telephone Encounter (Signed)
Pharmacy Patient Advocate Encounter  Received notification from [facility] that Prior Authorization for Levalbuterol HFA has been APPROVED from 05/18/2023 to 08/13/2024. Ran test claim, Copay is $refill too soon. This test claim was processed through [facility]- copay amounts may vary at other pharmacies due to pharmacy/plan contracts, or as the patient moves through the different stages of their insurance plan.

## 2023-05-22 DIAGNOSIS — E782 Mixed hyperlipidemia: Secondary | ICD-10-CM | POA: Diagnosis not present

## 2023-05-22 DIAGNOSIS — E531 Pyridoxine deficiency: Secondary | ICD-10-CM | POA: Diagnosis not present

## 2023-05-22 DIAGNOSIS — K220 Achalasia of cardia: Secondary | ICD-10-CM | POA: Diagnosis not present

## 2023-05-22 DIAGNOSIS — I471 Supraventricular tachycardia, unspecified: Secondary | ICD-10-CM | POA: Diagnosis not present

## 2023-05-22 DIAGNOSIS — I4719 Other supraventricular tachycardia: Secondary | ICD-10-CM | POA: Diagnosis not present

## 2023-05-22 DIAGNOSIS — R002 Palpitations: Secondary | ICD-10-CM | POA: Diagnosis not present

## 2023-05-22 DIAGNOSIS — R1310 Dysphagia, unspecified: Secondary | ICD-10-CM | POA: Diagnosis not present

## 2023-05-22 DIAGNOSIS — E611 Iron deficiency: Secondary | ICD-10-CM | POA: Diagnosis not present

## 2023-05-22 DIAGNOSIS — I10 Essential (primary) hypertension: Secondary | ICD-10-CM | POA: Diagnosis not present

## 2023-05-22 DIAGNOSIS — R000 Tachycardia, unspecified: Secondary | ICD-10-CM | POA: Diagnosis not present

## 2023-05-24 DIAGNOSIS — Z9049 Acquired absence of other specified parts of digestive tract: Secondary | ICD-10-CM | POA: Diagnosis not present

## 2023-05-24 DIAGNOSIS — K279 Peptic ulcer, site unspecified, unspecified as acute or chronic, without hemorrhage or perforation: Secondary | ICD-10-CM | POA: Diagnosis not present

## 2023-05-24 DIAGNOSIS — R1011 Right upper quadrant pain: Secondary | ICD-10-CM | POA: Diagnosis not present

## 2023-05-24 DIAGNOSIS — K219 Gastro-esophageal reflux disease without esophagitis: Secondary | ICD-10-CM | POA: Diagnosis not present

## 2023-05-24 DIAGNOSIS — R000 Tachycardia, unspecified: Secondary | ICD-10-CM | POA: Diagnosis not present

## 2023-05-24 DIAGNOSIS — K5900 Constipation, unspecified: Secondary | ICD-10-CM | POA: Diagnosis not present

## 2023-05-24 NOTE — Telephone Encounter (Signed)
Pt was advised of appt being split at her last OV

## 2023-05-25 DIAGNOSIS — R079 Chest pain, unspecified: Secondary | ICD-10-CM | POA: Diagnosis not present

## 2023-05-28 DIAGNOSIS — R9431 Abnormal electrocardiogram [ECG] [EKG]: Secondary | ICD-10-CM | POA: Diagnosis not present

## 2023-05-28 DIAGNOSIS — M25512 Pain in left shoulder: Secondary | ICD-10-CM | POA: Diagnosis not present

## 2023-05-28 DIAGNOSIS — M25511 Pain in right shoulder: Secondary | ICD-10-CM | POA: Diagnosis not present

## 2023-05-29 DIAGNOSIS — R002 Palpitations: Secondary | ICD-10-CM | POA: Diagnosis not present

## 2023-05-29 DIAGNOSIS — R0789 Other chest pain: Secondary | ICD-10-CM | POA: Diagnosis not present

## 2023-05-29 DIAGNOSIS — R079 Chest pain, unspecified: Secondary | ICD-10-CM | POA: Diagnosis not present

## 2023-05-30 DIAGNOSIS — R0789 Other chest pain: Secondary | ICD-10-CM | POA: Diagnosis not present

## 2023-05-30 DIAGNOSIS — R079 Chest pain, unspecified: Secondary | ICD-10-CM | POA: Diagnosis not present

## 2023-05-31 ENCOUNTER — Telehealth: Payer: Self-pay

## 2023-05-31 DIAGNOSIS — G35 Multiple sclerosis: Secondary | ICD-10-CM | POA: Diagnosis not present

## 2023-05-31 DIAGNOSIS — R519 Headache, unspecified: Secondary | ICD-10-CM | POA: Diagnosis not present

## 2023-05-31 DIAGNOSIS — R9082 White matter disease, unspecified: Secondary | ICD-10-CM | POA: Diagnosis not present

## 2023-05-31 DIAGNOSIS — I10 Essential (primary) hypertension: Secondary | ICD-10-CM | POA: Diagnosis not present

## 2023-05-31 DIAGNOSIS — R000 Tachycardia, unspecified: Secondary | ICD-10-CM | POA: Diagnosis not present

## 2023-05-31 DIAGNOSIS — K219 Gastro-esophageal reflux disease without esophagitis: Secondary | ICD-10-CM | POA: Diagnosis not present

## 2023-05-31 DIAGNOSIS — G4730 Sleep apnea, unspecified: Secondary | ICD-10-CM | POA: Diagnosis not present

## 2023-05-31 DIAGNOSIS — E785 Hyperlipidemia, unspecified: Secondary | ICD-10-CM | POA: Diagnosis not present

## 2023-05-31 DIAGNOSIS — E119 Type 2 diabetes mellitus without complications: Secondary | ICD-10-CM | POA: Diagnosis not present

## 2023-05-31 DIAGNOSIS — M542 Cervicalgia: Secondary | ICD-10-CM | POA: Diagnosis not present

## 2023-05-31 DIAGNOSIS — R079 Chest pain, unspecified: Secondary | ICD-10-CM | POA: Diagnosis not present

## 2023-05-31 DIAGNOSIS — Z8542 Personal history of malignant neoplasm of other parts of uterus: Secondary | ICD-10-CM | POA: Diagnosis not present

## 2023-05-31 LAB — LAB REPORT - SCANNED

## 2023-05-31 NOTE — Telephone Encounter (Signed)
Needs appt

## 2023-05-31 NOTE — Telephone Encounter (Signed)
Patient advised. She refused an appointment today with Dr Tamera Punt. She states she has an appointment tomorrow with Dr Linford Arnold.

## 2023-05-31 NOTE — Telephone Encounter (Signed)
Jennifer [name] states she is having right lung pain when she takes a breath in. She went to the ED and they did not due a D-Dimer. She is requesting a D-Dimer from Dr Linford Arnold.

## 2023-06-01 ENCOUNTER — Telehealth: Payer: Self-pay

## 2023-06-01 ENCOUNTER — Ambulatory Visit (INDEPENDENT_AMBULATORY_CARE_PROVIDER_SITE_OTHER): Payer: Medicare HMO | Admitting: Family Medicine

## 2023-06-01 VITALS — BP 143/81 | HR 92

## 2023-06-01 DIAGNOSIS — E531 Pyridoxine deficiency: Secondary | ICD-10-CM | POA: Diagnosis not present

## 2023-06-01 DIAGNOSIS — R0789 Other chest pain: Secondary | ICD-10-CM | POA: Diagnosis not present

## 2023-06-01 DIAGNOSIS — I471 Supraventricular tachycardia, unspecified: Secondary | ICD-10-CM | POA: Diagnosis not present

## 2023-06-01 DIAGNOSIS — R0902 Hypoxemia: Secondary | ICD-10-CM | POA: Diagnosis not present

## 2023-06-01 DIAGNOSIS — R079 Chest pain, unspecified: Secondary | ICD-10-CM | POA: Diagnosis not present

## 2023-06-01 DIAGNOSIS — E876 Hypokalemia: Secondary | ICD-10-CM | POA: Diagnosis not present

## 2023-06-01 DIAGNOSIS — I4719 Other supraventricular tachycardia: Secondary | ICD-10-CM | POA: Diagnosis not present

## 2023-06-01 DIAGNOSIS — R002 Palpitations: Secondary | ICD-10-CM | POA: Diagnosis not present

## 2023-06-01 DIAGNOSIS — G4489 Other headache syndrome: Secondary | ICD-10-CM | POA: Diagnosis not present

## 2023-06-01 DIAGNOSIS — E611 Iron deficiency: Secondary | ICD-10-CM | POA: Diagnosis not present

## 2023-06-01 DIAGNOSIS — R000 Tachycardia, unspecified: Secondary | ICD-10-CM | POA: Diagnosis not present

## 2023-06-01 DIAGNOSIS — I10 Essential (primary) hypertension: Secondary | ICD-10-CM | POA: Diagnosis not present

## 2023-06-01 DIAGNOSIS — E782 Mixed hyperlipidemia: Secondary | ICD-10-CM | POA: Diagnosis not present

## 2023-06-01 NOTE — Progress Notes (Unsigned)
Established Patient Office Visit  Subjective   Patient ID: Jennifer [name], female    DOB: 01/21/1966  Age: 57 y.o. MRN: [id number]  No chief complaint on file.   HPI  Concerned about high BP -today it still running around 143/81 she still feels like her heart is racing and causing pressure at times. Has take the hydralazine prn.  She did go to the emergency department on the 17th for headache for several days she felt a pain shoot up her spine and felt an exploding sensation in her head.  Then started experiencing neck pain jaw pain shoulder pain and feeling sweaty.  She was concerned about her heart.  C/O of left sided chest pain  that is now feeling like a pressure sensation on the right side of your chest.   She is currently wearing a cardiac monitor put on by cardiology for palpitations.    ROS    Objective:     BP (!) 143/81   Pulse 92   LMP 06/25/2013   SpO2 99%    Physical Exam Vitals and nursing note reviewed.  Constitutional:      Appearance: Normal appearance.  HENT:     Head: Normocephalic and atraumatic.  Eyes:     Conjunctiva/sclera: Conjunctivae normal.  Cardiovascular:     Rate and Rhythm: Normal rate and regular rhythm.  Pulmonary:     Effort: Pulmonary effort is normal.     Breath sounds: Normal breath sounds.  Skin:    General: Skin is warm and dry.  Neurological:     Mental Status: She is alert.  Psychiatric:        Mood and Affect: Mood normal.      No results found for any visits on 06/01/23.    The ASCVD Risk score (Arnett DK, et al., 2019) failed to calculate for the following reasons:   The patient has a prior MI or stroke diagnosis    Assessment & Plan:   Problem List Items Addressed This Visit       Cardiovascular and Mediastinum   Hypertension with intolerance to multiple antihypertensive drugs    Pressure still not well-controlled.  Encouraged her to take the hydralazine consistently 3 times a day as well as the  metoprolol.  Continue to work on lifestyle changes.  She says she really does try to be active but sometimes she does not feel well enough to get up and do a lot.  If she does not want to use the hydralazine then we discussed maybe going back to the spironolactone.  She is gena check at home and see if she still has a prescription and if not please let me know at 1 point in time she had difficulty taking it because she was not hydrating well but she has done much better with that in the last several years so I think she would do really well.  She could always start with a half of a tab daily.        Other   Hypokalemia    Encouraged her to go ahead and restart the spironolactone if she needs a new prescription please let us know.  She also has potassium supplement to use as needed.      Other Visit Diagnoses     Atypical chest pain    -  Primary   Relevant Orders   D-dimer, quantitative (Completed)      Atypical chest pain -she would really like to have a  D-dimer drawn she has had a lot of tightness in her chest as well and feels like it is hard to take a deep breath at times and is worried that she may have a pulmonary embolism.  Otherwise workup in the ED was normal and she does have a cardiac monitor in place.  No follow-ups on file.    Nani Gasser, MD

## 2023-06-01 NOTE — Telephone Encounter (Signed)
Received a call from  [facility] ( med tech) with [facility] [phone]. She states that the patient came in for lab draw today and the  BMP was hemolyzed and will not be released due to this  The D-dimer will be processed and results sent.

## 2023-06-01 NOTE — Patient Instructions (Signed)
Let me know if you don't have the 25mg  spironolactone.

## 2023-06-02 DIAGNOSIS — R079 Chest pain, unspecified: Secondary | ICD-10-CM | POA: Diagnosis not present

## 2023-06-02 LAB — LAB REPORT - SCANNED

## 2023-06-04 NOTE — Progress Notes (Signed)
D-dimer just borderline line elevated. No indication for DVT at this time.

## 2023-06-06 DIAGNOSIS — I10 Essential (primary) hypertension: Secondary | ICD-10-CM | POA: Diagnosis not present

## 2023-06-06 DIAGNOSIS — R030 Elevated blood-pressure reading, without diagnosis of hypertension: Secondary | ICD-10-CM | POA: Diagnosis not present

## 2023-06-07 ENCOUNTER — Encounter: Payer: Self-pay | Admitting: Family Medicine

## 2023-06-07 ENCOUNTER — Encounter: Payer: Self-pay | Admitting: Emergency Medicine

## 2023-06-07 DIAGNOSIS — R000 Tachycardia, unspecified: Secondary | ICD-10-CM | POA: Diagnosis not present

## 2023-06-07 DIAGNOSIS — R079 Chest pain, unspecified: Secondary | ICD-10-CM | POA: Diagnosis not present

## 2023-06-07 NOTE — Assessment & Plan Note (Addendum)
Encouraged her to go ahead and restart the spironolactone if she needs a new prescription please let us know.  She also has potassium supplement to use as needed.

## 2023-06-07 NOTE — Assessment & Plan Note (Addendum)
Pressure still not well-controlled.  Encouraged her to take the hydralazine consistently 3 times a day as well as the metoprolol.  Continue to work on lifestyle changes.  She says she really does try to be active but sometimes she does not feel well enough to get up and do a lot.  If she does not want to use the hydralazine then we discussed maybe going back to the spironolactone.  She is gena check at home and see if she still has a prescription and if not please let me know at 1 point in time she had difficulty taking it because she was not hydrating well but she has done much better with that in the last several years so I think she would do really well.  She could always start with a half of a tab daily.

## 2023-06-10 DIAGNOSIS — R002 Palpitations: Secondary | ICD-10-CM | POA: Diagnosis not present

## 2023-06-10 DIAGNOSIS — E531 Pyridoxine deficiency: Secondary | ICD-10-CM | POA: Diagnosis not present

## 2023-06-10 DIAGNOSIS — E611 Iron deficiency: Secondary | ICD-10-CM | POA: Diagnosis not present

## 2023-06-10 DIAGNOSIS — I10 Essential (primary) hypertension: Secondary | ICD-10-CM | POA: Diagnosis not present

## 2023-06-10 DIAGNOSIS — R0789 Other chest pain: Secondary | ICD-10-CM | POA: Diagnosis not present

## 2023-06-10 DIAGNOSIS — I471 Supraventricular tachycardia, unspecified: Secondary | ICD-10-CM | POA: Diagnosis not present

## 2023-06-10 DIAGNOSIS — R000 Tachycardia, unspecified: Secondary | ICD-10-CM | POA: Diagnosis not present

## 2023-06-10 DIAGNOSIS — G8929 Other chronic pain: Secondary | ICD-10-CM | POA: Diagnosis not present

## 2023-06-10 DIAGNOSIS — E782 Mixed hyperlipidemia: Secondary | ICD-10-CM | POA: Diagnosis not present

## 2023-06-11 ENCOUNTER — Ambulatory Visit: Payer: Medicare HMO | Admitting: Psychiatry

## 2023-06-11 DIAGNOSIS — R079 Chest pain, unspecified: Secondary | ICD-10-CM | POA: Diagnosis not present

## 2023-06-11 DIAGNOSIS — R000 Tachycardia, unspecified: Secondary | ICD-10-CM | POA: Diagnosis not present

## 2023-06-11 LAB — LAB REPORT - SCANNED: EGFR: 90

## 2023-06-12 ENCOUNTER — Encounter: Payer: Self-pay | Admitting: Family Medicine

## 2023-06-12 DIAGNOSIS — R0600 Dyspnea, unspecified: Secondary | ICD-10-CM | POA: Diagnosis not present

## 2023-06-12 DIAGNOSIS — E876 Hypokalemia: Secondary | ICD-10-CM | POA: Diagnosis not present

## 2023-06-12 DIAGNOSIS — R002 Palpitations: Secondary | ICD-10-CM | POA: Diagnosis not present

## 2023-06-12 DIAGNOSIS — I10 Essential (primary) hypertension: Secondary | ICD-10-CM | POA: Diagnosis not present

## 2023-06-12 DIAGNOSIS — I491 Atrial premature depolarization: Secondary | ICD-10-CM | POA: Diagnosis not present

## 2023-06-12 DIAGNOSIS — R0689 Other abnormalities of breathing: Secondary | ICD-10-CM | POA: Diagnosis not present

## 2023-06-12 DIAGNOSIS — R000 Tachycardia, unspecified: Secondary | ICD-10-CM | POA: Diagnosis not present

## 2023-06-12 DIAGNOSIS — G35 Multiple sclerosis: Secondary | ICD-10-CM | POA: Diagnosis not present

## 2023-06-12 DIAGNOSIS — R0789 Other chest pain: Secondary | ICD-10-CM | POA: Diagnosis not present

## 2023-06-12 DIAGNOSIS — E870 Hyperosmolality and hypernatremia: Secondary | ICD-10-CM | POA: Diagnosis not present

## 2023-06-12 DIAGNOSIS — E271 Primary adrenocortical insufficiency: Secondary | ICD-10-CM | POA: Diagnosis not present

## 2023-06-12 DIAGNOSIS — E1165 Type 2 diabetes mellitus with hyperglycemia: Secondary | ICD-10-CM | POA: Diagnosis not present

## 2023-06-12 LAB — LAB REPORT - SCANNED: EGFR: 90

## 2023-06-13 ENCOUNTER — Telehealth: Payer: Self-pay | Admitting: Internal Medicine

## 2023-06-13 DIAGNOSIS — R002 Palpitations: Secondary | ICD-10-CM | POA: Diagnosis not present

## 2023-06-13 DIAGNOSIS — R000 Tachycardia, unspecified: Secondary | ICD-10-CM | POA: Diagnosis not present

## 2023-06-13 DIAGNOSIS — I498 Other specified cardiac arrhythmias: Secondary | ICD-10-CM | POA: Diagnosis not present

## 2023-06-13 DIAGNOSIS — E1165 Type 2 diabetes mellitus with hyperglycemia: Secondary | ICD-10-CM | POA: Diagnosis not present

## 2023-06-13 DIAGNOSIS — R0789 Other chest pain: Secondary | ICD-10-CM | POA: Diagnosis not present

## 2023-06-13 NOTE — Telephone Encounter (Signed)
Patient called and stated she is having some issues with breathing and requested a call back.

## 2023-06-13 NOTE — Telephone Encounter (Signed)
Called patient back but no answer. Left a message for her to call the office back.  Kriste Basque [phone]

## 2023-06-14 NOTE — Telephone Encounter (Signed)
It appears she was seen in ed yesterday and dr Maurine Minister said unless her asthma is flaring she does not need to be seen in clinic

## 2023-06-15 ENCOUNTER — Ambulatory Visit: Payer: Medicare HMO | Admitting: Family Medicine

## 2023-06-16 DIAGNOSIS — I10 Essential (primary) hypertension: Secondary | ICD-10-CM | POA: Diagnosis not present

## 2023-06-16 DIAGNOSIS — R000 Tachycardia, unspecified: Secondary | ICD-10-CM | POA: Diagnosis not present

## 2023-06-16 DIAGNOSIS — R002 Palpitations: Secondary | ICD-10-CM | POA: Diagnosis not present

## 2023-06-16 DIAGNOSIS — Z556 Problems related to health literacy: Secondary | ICD-10-CM | POA: Diagnosis not present

## 2023-06-16 DIAGNOSIS — Z79899 Other long term (current) drug therapy: Secondary | ICD-10-CM | POA: Diagnosis not present

## 2023-06-16 LAB — LAB REPORT - SCANNED: EGFR: 90

## 2023-06-17 DIAGNOSIS — R079 Chest pain, unspecified: Secondary | ICD-10-CM | POA: Diagnosis not present

## 2023-06-17 DIAGNOSIS — R002 Palpitations: Secondary | ICD-10-CM | POA: Diagnosis not present

## 2023-06-17 DIAGNOSIS — R61 Generalized hyperhidrosis: Secondary | ICD-10-CM | POA: Diagnosis not present

## 2023-06-17 DIAGNOSIS — R000 Tachycardia, unspecified: Secondary | ICD-10-CM | POA: Diagnosis not present

## 2023-06-17 DIAGNOSIS — I10 Essential (primary) hypertension: Secondary | ICD-10-CM | POA: Diagnosis not present

## 2023-06-18 DIAGNOSIS — B9789 Other viral agents as the cause of diseases classified elsewhere: Secondary | ICD-10-CM | POA: Diagnosis not present

## 2023-06-18 DIAGNOSIS — Z20822 Contact with and (suspected) exposure to covid-19: Secondary | ICD-10-CM | POA: Diagnosis not present

## 2023-06-18 DIAGNOSIS — J069 Acute upper respiratory infection, unspecified: Secondary | ICD-10-CM | POA: Diagnosis not present

## 2023-06-18 DIAGNOSIS — R000 Tachycardia, unspecified: Secondary | ICD-10-CM | POA: Diagnosis not present

## 2023-06-21 ENCOUNTER — Ambulatory Visit: Payer: Medicare HMO | Admitting: Internal Medicine

## 2023-06-21 ENCOUNTER — Encounter: Payer: Self-pay | Admitting: Family

## 2023-06-21 ENCOUNTER — Ambulatory Visit (INDEPENDENT_AMBULATORY_CARE_PROVIDER_SITE_OTHER): Payer: Medicare HMO | Admitting: Family

## 2023-06-21 VITALS — BP 164/100 | HR 88 | Temp 97.9°F | Resp 16 | Ht 62.0 in | Wt 231.7 lb

## 2023-06-21 DIAGNOSIS — J4530 Mild persistent asthma, uncomplicated: Secondary | ICD-10-CM

## 2023-06-21 DIAGNOSIS — K219 Gastro-esophageal reflux disease without esophagitis: Secondary | ICD-10-CM

## 2023-06-21 DIAGNOSIS — J3089 Other allergic rhinitis: Secondary | ICD-10-CM

## 2023-06-21 MED ORDER — AZELASTINE HCL 0.1 % NA SOLN: NASAL | 5 refills | Status: DC

## 2023-06-21 NOTE — Progress Notes (Signed)
[address] [place] [address] Dept: [phone]  FOLLOW UP NOTE  Patient ID: Jennifer [name], female    DOB: 03/29/1966  Age: 57 y.o. MRN: [id number] Date of Office Visit: 06/21/2023  Assessment  Chief Complaint: Nasal Congestion (With a lot of sneezing) and Cough (She will cough to where it causes her to vomit.)  HPI Jennifer [name] is a 57 year old female who presents today for an acute visit of nasal congestion, sneezing, cough, and postnasal drip.  She was last seen on May 11, 2023 by Dr. Maurine Minister for esophageal dysphagia, upper respiratory symptoms, hyperglycemia, multiple hernias, and asthma.  She denies any new diagnosis or surgery since her last office visit.  Since her last office visit she has made multiple trips to the emergency room for viral upper respiratory infection, palpitations, essential hypertension, chest discomfort, dyspnea and respiratory abnormality, hyperglycemia, chronic chest pain elevated blood pressure reading, abnormal laboratory test, right-sided chest pain, headache acute pain in both shoulders, right upper quadrant abdominal pain, dysphagia, and foreign body sensation in throat.  Upper respiratory symptoms.  She reports nasal congestion, postnasal drip, and sneezing more frequently.  This morning she vomited due to coughing and drainage.  What came out was frothy colored.  She is currently using fluticasone few days at a time, but she has to stop due to getting green coming from her nose and blood.  After reviewing proper technique it was found that she was not using proper technique.  She reports that she has tried [place] in the past and did not do well with this nose spray.  She will sometimes use saline rinses, but this will irritate her sinuses.  She unfortunately has multiple medication intolerances and cannot tolerate most allergy medicines.  She is willing to retry azelastine nasal spray.  She does not remember having any problems with this nasal  spray.  Asthma: She reports coughing at times, tightness in chest, shortness of breath, and nocturnal awakenings due to breathing problems.  At times it will feel like her lungs are balloons.  She reports last night she woke up screaming that she could not breathe.  She mentions that this could be due to her sleep apnea.  She is tried tolerating a CPAP 3 times, but has failed. She has an upcoming appointment due to her sleep apnea in December. She is not able to tolerate steroids due to it causing her blood sugar to be elevated.  She is using Xopenex only if her lungs are really bad.  Her Xopenex is helpful only if she has RSV or congestion in her chest,  is sick, or in any distress.  She reports a couple weeks ago she had an elevated D-dimer was told by the Emergency Room that was normal for her age.  She had a chest x-ray on May 31, 2023 showing: " FINDINGS:  Normal heart size and mediastinal contours. No acute infiltrate or  edema. No effusion or pneumothorax. No acute osseous findings. "  Esophageal dysphagia: She reports that she continues to have difficulty swallowing, but was told that it has nothing to do with up here as she points to her neck.  She did a modified barium swallow and reports that it is due to the muscles not working.  Reports from her esophagitis barium swallow show from May 22, 2023:  "FINDINGS:  Vestibular Penetration:  None seen.  Aspiration: None seen. Her x-ray of the neck soft tissue from 05/21/23 shows: "Negative.  No  radiopaque foreign bodies."  Aspiration:  None seen."  She is currently taking Pepcid now.  She can only take PPIs for a few days because it causes for her to have pressure in her chest.  She has not  had her endoscopy/colonoscopy.  She reports that this was canceled again due to having diverticulitis recently.  She reports that she continues to have episodes of tachycardia and sweating.  She has an upcoming appointment with cardiology on the 15th  of this month.  She feels like her blood pressure is high today because she vomited right after taking her blood pressure medicine.   Drug Allergies:  Allergies  Allergen Reactions   Azithromycin Shortness Of Breath    Lip swelling, SOB.      Ciprofloxacin Swelling    REACTION: tongue swells   Codeine Shortness Of Breath   Erythromycin Base Itching and Rash   Sulfa Antibiotics Other (See Comments), Rash and Shortness Of Breath   Sulfasalazine Rash and Shortness Of Breath    Other reaction(s): Other (See Comments) Other reaction(s): SHORTNESS OF BREATH   Telmisartan Swelling    Tongue swelling, Micardis   Ace Inhibitors Cough   Aspirin Hives and Other (See Comments)    flushing   Atenolol Other (See Comments)    Squeezing chest sensation   Avelox [Moxifloxacin Hcl In Nacl] Itching        Beta Adrenergic Blockers Other (See Comments)    Feels like chest tightening labetalol, bystolic  Feels like chest tightening "Metoprolol"    Buspar [Buspirone] Other (See Comments)    Light headed   Butorphanol Tartrate Other (See Comments)    Patient aggitated   Cetirizine Hives and Rash        Clonidine Hcl     REACTION: makes blood pressure high   Cortisone     Feels like she is going crazy   Erythromycin Rash   Fentanyl Other (See Comments)    aggressive    Fluoxetine Hcl Other (See Comments)    REACTION: headaches   Ketorolac Tromethamine     jittery   Lidocaine Other (See Comments)    When it involves the throat,    Lisinopril Cough   Metoclopramide Hcl Other (See Comments)    Dystonic reaction   Midazolam Other (See Comments)    agitation Slow to wake up   Montelukast Other (See Comments)    Singulair   Montelukast Sodium Other (See Comments)    DOES NOT REMEMBER  Don't remember-told not to take   Naproxen Other (See Comments)    FLUSHING Pt states she took Ibuprofen today (10/08/19)   Nitroglycerin Other (See Comments)    Unknown   Paroxetine Other (See  Comments)    REACTION: headaches   Penicillins Rash   Pravastatin Other (See Comments)    Myalgias   Promethazine Other (See Comments)    Dystonic reaction   Promethazine Hcl Other (See Comments)    jittery   Quinolones Swelling and Rash   Serotonin Reuptake Inhibitors (Ssris) Other (See Comments)    Headache Effexor, prozac, zoloft,    Sertraline Hcl     REACTION: headaches   Stelazine [Trifluoperazine] Other (See Comments)    Dystonic reaction   Tobramycin Itching and Rash   Trifluoperazine Hcl     dystonic   Atrovent Nasal Spray [Ipratropium]     Tachycardia and shaking   Diltiazem Other (See Comments)    Chest pain   Iodinated Contrast Media  Other reaction(s): Other (See Comments) Chest heaviness/sob   Metoprolol     Other reaction(s): OTHER  Other Reaction(s): Dizziness   Polyethylene Glycol 3350     Other reaction(s): Laryngeal Edema (ALLERGY)   Propoxyphene    Adhesive [Tape] Rash    EKG monitor patches, some tapes Blisters, rash, itching, welts.   Butorphanol Anxiety    Patient agitated   Ceftriaxone Rash    rocephin   Iron Rash    Flushing with certain IV types   Metoclopramide Itching and Other (See Comments)    Dystonic reaction   Metronidazole Rash   Other Rash and Other (See Comments)    Uncoded Allergy. Allergen: steriods, Other Reaction: Not Assessed Other reaction(s): Flushing (ALLERGY/intolerance), GI Upset (intolerance), Hypertension (intolerance), Increased Heart Rate (intolerance), Mental Status Changes (intolerance), Other (See Comments), Tachycardia / Palpitations  (intolerance) Hospital gowns leave a rash.    Prednisone Anxiety and Palpitations   Prochlorperazine Anxiety    Compazine:  Dystonic reaction   Venlafaxine Anxiety   Zyrtec [Cetirizine Hcl] Rash    All over body    Review of Systems: Negative except as per HPI.   Physical Exam: BP (!) 164/100 (BP Location: Left Arm, Patient Position: Sitting, Cuff Size: Large)    Pulse 88   Temp 97.9 F (36.6 C) (Temporal)   Resp 16   Ht 5\' 2"  (1.575 m)   Wt 231 lb 11.2 oz (105.1 kg)   LMP 06/25/2013   SpO2 97%   BMI 42.38 kg/m    Physical Exam Constitutional:      Appearance: Normal appearance.  HENT:     Head: Normocephalic and atraumatic.     Comments: Pharynx normal, eyes normal, ears normal, nose: Bilateral lower turbinates mildly edematous and slightly erythematous with clear drainage noted.  Left turbinate greater than right turbinate.    Right Ear: Tympanic membrane, ear canal and external ear normal.     Left Ear: Tympanic membrane, ear canal and external ear normal.     Mouth/Throat:     Mouth: Mucous membranes are moist.     Pharynx: Oropharynx is clear.  Eyes:     Conjunctiva/sclera: Conjunctivae normal.  Cardiovascular:     Rate and Rhythm: Regular rhythm.     Heart sounds: Normal heart sounds.  Pulmonary:     Effort: Pulmonary effort is normal.     Breath sounds: Normal breath sounds.     Comments: Lungs clear to auscultation Musculoskeletal:     Cervical back: Neck supple.  Skin:    General: Skin is warm.  Neurological:     Mental Status: She is alert and oriented to person, place, and time.  Psychiatric:        Mood and Affect: Mood normal.        Behavior: Behavior normal.        Thought Content: Thought content normal.        Judgment: Judgment normal.     Diagnostics: FVC 2.40 L (79%), FEV1 2.13 L (89%), FEV1/FVC 0.89.  Spirometry indicates normal spirometry.    Assessment and Plan: 1. Other allergic rhinitis   2. Mild persistent asthma without complication   3. Gastroesophageal reflux disease, unspecified whether esophagitis present     Meds ordered this encounter  Medications   azelastine (ASTELIN) 0.1 % nasal spray    Sig: Place 1 spray in each nostril twice a day as needed for runny nose/drainage down throat    Dispense:  30 mL    Refill:  5    Patient Instructions  Esophageal Dysphagia Recent onset of  severe pain and sensation of food getting stuck. History of esophageal spasm and sliding hiatal hernia. -continue to alternate between omeprazole and famotidine - if food becomes stuck, recommend ED evaluation. -Follow-up for Gastroenterology for endoscopy.  Upper Respiratory Symptoms Chronic sinusitis with increased mucus production and cough. -Continue Flonase as directed by Dr. Eppie Gibson. 1 spray in each nostril daily -Let's retry Astelin (azelastine) nasal spray 1 spray in each nostril once to twice a day as needed for runny nose/drainage down throat   Asthma Your breathing test looks great today Chronic condition, patient reports occasional use of Xopenex inhaler. -Continue current management, no changes to treatment plan at this time.  Verbally discussed that her blood pressure is elevated today in the office that we would recheck her blood pressure.  She reports her blood pressure is up today because she vomited shortly after taking her blood pressure medicine' Follow-up in 6 months or sooner if needed -Check in with Gastroenterology for endoscopy. -Continue current asthma management.      Return in about 6 months (around 12/19/2023), or if symptoms worsen or fail to improve.    Thank you for the opportunity to care for this patient.  Please do not hesitate to contact me with questions.  Nehemiah Settle, FNP [facility]

## 2023-06-21 NOTE — Patient Instructions (Addendum)
Esophageal Dysphagia Recent onset of severe pain and sensation of food getting stuck. History of esophageal spasm and sliding hiatal hernia. -continue to alternate between omeprazole and famotidine - if food becomes stuck, recommend ED evaluation. -Follow-up for Gastroenterology for endoscopy.  Upper Respiratory Symptoms Chronic sinusitis with increased mucus production and cough. -Continue Flonase as directed by Dr. Eppie Gibson. 1 spray in each nostril daily -Let's retry Astelin (azelastine) nasal spray 1 spray in each nostril once to twice a day as needed for runny nose/drainage down throat   Asthma Your breathing test looks great today Chronic condition, patient reports occasional use of Xopenex inhaler. -Continue current management, no changes to treatment plan at this time.  Verbally discussed that her blood pressure is elevated today in the office that we would recheck her blood pressure.  She reports her blood pressure is up today because she vomited shortly after taking her blood pressure medicine' Follow-up in 6 months or sooner if needed -Check in with Gastroenterology for endoscopy. -Continue current asthma management.

## 2023-06-22 ENCOUNTER — Telehealth: Payer: Medicare HMO | Admitting: Family Medicine

## 2023-06-22 ENCOUNTER — Encounter: Payer: Self-pay | Admitting: Family Medicine

## 2023-06-22 DIAGNOSIS — Z20822 Contact with and (suspected) exposure to covid-19: Secondary | ICD-10-CM | POA: Diagnosis not present

## 2023-06-22 DIAGNOSIS — E118 Type 2 diabetes mellitus with unspecified complications: Secondary | ICD-10-CM | POA: Diagnosis not present

## 2023-06-22 DIAGNOSIS — G8929 Other chronic pain: Secondary | ICD-10-CM

## 2023-06-22 DIAGNOSIS — M25511 Pain in right shoulder: Secondary | ICD-10-CM | POA: Diagnosis not present

## 2023-06-22 DIAGNOSIS — E876 Hypokalemia: Secondary | ICD-10-CM | POA: Diagnosis not present

## 2023-06-22 DIAGNOSIS — R42 Dizziness and giddiness: Secondary | ICD-10-CM | POA: Diagnosis not present

## 2023-06-22 DIAGNOSIS — R9431 Abnormal electrocardiogram [ECG] [EKG]: Secondary | ICD-10-CM | POA: Diagnosis not present

## 2023-06-22 DIAGNOSIS — I213 ST elevation (STEMI) myocardial infarction of unspecified site: Secondary | ICD-10-CM | POA: Diagnosis not present

## 2023-06-22 DIAGNOSIS — E1165 Type 2 diabetes mellitus with hyperglycemia: Secondary | ICD-10-CM | POA: Diagnosis not present

## 2023-06-22 DIAGNOSIS — F418 Other specified anxiety disorders: Secondary | ICD-10-CM | POA: Diagnosis not present

## 2023-06-22 DIAGNOSIS — I10 Essential (primary) hypertension: Secondary | ICD-10-CM

## 2023-06-22 DIAGNOSIS — R002 Palpitations: Secondary | ICD-10-CM | POA: Diagnosis not present

## 2023-06-22 DIAGNOSIS — R5383 Other fatigue: Secondary | ICD-10-CM | POA: Diagnosis not present

## 2023-06-22 DIAGNOSIS — Z87891 Personal history of nicotine dependence: Secondary | ICD-10-CM | POA: Diagnosis not present

## 2023-06-22 DIAGNOSIS — R000 Tachycardia, unspecified: Secondary | ICD-10-CM | POA: Diagnosis not present

## 2023-06-22 MED ORDER — LANCETS MISC. MISC: 1.0000 | Freq: Every day | 3 refills | Status: DC

## 2023-06-22 MED ORDER — BLOOD GLUCOSE TEST VI STRP: 1.0000 | ORAL_STRIP | Freq: Every day | 3 refills | Status: DC

## 2023-06-22 MED ORDER — BLOOD GLUCOSE MONITORING SUPPL DEVI: 1.0000 | Freq: Every day | 0 refills | Status: DC

## 2023-06-22 MED ORDER — LANCET DEVICE MISC
1.0000 | Freq: Every day | 3 refills | Status: DC
Start: 2023-06-22 — End: 2023-07-20

## 2023-06-22 NOTE — Assessment & Plan Note (Signed)
New order for glucometer, lancets, strips sent to pharmacy.  Still encouraged her to try starting to take the metformin.  Continue to work on cutting back intake of carbohydrates and sugars.  Increase vegetables and proteins.  She does have a follow-up in 2 weeks I believe she will be due for an A1c at that time.

## 2023-06-22 NOTE — Assessment & Plan Note (Signed)
Blood pressure is not well-controlled currently.  She is on 37.5 mg total of metoprolol she did try to take the spironolactone daily for a few days in a row but felt like it was making her more tachycardic and raising her blood pressure so she stopped and has gone back to using it as needed.  She did have low potassium when she went to the ED the other day.  For do start her on another medication and but declined at this time.

## 2023-06-22 NOTE — Progress Notes (Signed)
Virtual Visit via Video Note  I connected with Jennifer [name] on 06/22/23 at  2:20 PM EST by a video enabled telemedicine application and verified that I am speaking with the correct person using two identifiers.   I discussed the limitations of evaluation and management by telemedicine and the availability of in person appointments. The patient expressed understanding and agreed to proceed.  Patient location: at home Provider location: in office  Subjective:    CC:   Chief Complaint  Patient presents with   Medical Management of Chronic Issues    HPI:  She has been sick.  She says she feels like it started with the shortness of breath that she mention when she was here about 2 weeks ago and gradually just darted feeling worse with some sinus drainage and congestion and just feeling really tired and exhausted.  She has been to the ED about 3 times in the last week for palpitations and tachycardia.  Blood pressure has been elevated when she is gone and her blood sugars have been elevated.   Diabetes-she says her current glucometer sometimes will be 30 points different if she checks it and then just check that again a few minutes later so she feels like it might be one of the Accu-Cheks that was recalled but she is not on a percent sure she wants to know if she can get a new meter.  She has not started the metformin.    Having a lot of shoulder pain and needs to schedule with the orthopedist but has not done so yet.  Her husband just had a cardiac ablation earlier this week.  And while she was sitting in the waiting room she felt like she was going to blackout and felt like her head was spinning.  She has had vertigo before but felt like this was a little different.    Past medical history, Surgical history, Family history not pertinant except as noted below, Social history, Allergies, and medications have been entered into the medical record, reviewed, and corrections made.     Objective:    General: Speaking clearly in complete sentences without any shortness of breath.  Alert and oriented x3.  Normal judgment. No apparent acute distress.    Impression and Recommendations:    Problem List Items Addressed This Visit       Cardiovascular and Mediastinum   Hypertension with intolerance to multiple antihypertensive drugs    Blood pressure is not well-controlled currently.  She is on 37.5 mg total of metoprolol she did try to take the spironolactone daily for a few days in a row but felt like it was making her more tachycardic and raising her blood pressure so she stopped and has gone back to using it as needed.  She did have low potassium when she went to the ED the other day.  For do start her on another medication and but declined at this time.        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  Still encouraged her to try starting to take the metformin.  Continue to work on cutting back intake of carbohydrates and sugars.  Increase vegetables and proteins.  She does have a follow-up in 2 weeks I believe she will be due for an A1c at that time.        Other   Depression with  anxiety    I still would really like for her to consider starting the buspirone.  She has had the medication for a couple of months.  She is still working with a therapist so I really encouraged her to reach out she has a lot of stressors right now with her personal health, not feeling well, her cat of 19 years is probably dying and is very sick right now and her husband also recently having surgery.      Chronic pain in right shoulder    Encouraged her to get back in with the orthopedist.       No orders of the defined types were placed in this encounter.   Meds ordered this encounter  Medications   Blood Glucose Monitoring Suppl DEVI    Sig: 1 each by Does not apply route daily. May  substitute to any manufacturer covered by patient's insurance.  Patient prefers not have an Accu-Chek if possible.    Dispense:  1 each    Refill:  0   Glucose Blood (BLOOD GLUCOSE TEST STRIPS) STRP    Sig: 1 each by In Vitro route daily. May substitute to any manufacturer covered by patient's insurance.    Dispense:  100 strip    Refill:  3   Lancet Device MISC    Sig: 1 each by Does not apply route daily. May substitute to any manufacturer covered by patient's insurance.    Dispense:  1 each    Refill:  3   Lancets Misc. MISC    Sig: 1 each by Does not apply route daily. May substitute to any manufacturer covered by patient's insurance.    Dispense:  100 each    Refill:  3     I discussed the assessment and treatment plan with the patient. The patient was provided an opportunity to ask questions and all were answered. The patient agreed with the plan and demonstrated an understanding of the instructions.   The patient was advised to call back or seek an in-person evaluation if the symptoms worsen or if the condition fails to improve as anticipated.  I spent 40 minutes on the day of the encounter to include pre-visit record review, face-to-face time with the patient and post visit ordering of test.   Nani Gasser, MD

## 2023-06-22 NOTE — Assessment & Plan Note (Signed)
I still would really like for her to consider starting the buspirone.  She has had the medication for a couple of months.  She is still working with a therapist so I really encouraged her to reach out she has a lot of stressors right now with her personal health, not feeling well, her cat of 19 years is probably dying and is very sick right now and her husband also recently having surgery.

## 2023-06-22 NOTE — Assessment & Plan Note (Signed)
Encouraged her to get back in with the orthopedist.

## 2023-06-23 DIAGNOSIS — E876 Hypokalemia: Secondary | ICD-10-CM | POA: Diagnosis not present

## 2023-06-23 DIAGNOSIS — M79602 Pain in left arm: Secondary | ICD-10-CM | POA: Diagnosis not present

## 2023-06-24 DIAGNOSIS — R9431 Abnormal electrocardiogram [ECG] [EKG]: Secondary | ICD-10-CM | POA: Diagnosis not present

## 2023-06-26 ENCOUNTER — Encounter: Payer: Self-pay | Admitting: Family Medicine

## 2023-06-26 ENCOUNTER — Telehealth (INDEPENDENT_AMBULATORY_CARE_PROVIDER_SITE_OTHER): Payer: Medicare HMO | Admitting: Family Medicine

## 2023-06-26 DIAGNOSIS — R202 Paresthesia of skin: Secondary | ICD-10-CM | POA: Diagnosis not present

## 2023-06-26 DIAGNOSIS — R0789 Other chest pain: Secondary | ICD-10-CM | POA: Diagnosis not present

## 2023-06-26 DIAGNOSIS — R051 Acute cough: Secondary | ICD-10-CM | POA: Diagnosis not present

## 2023-06-26 DIAGNOSIS — R0602 Shortness of breath: Secondary | ICD-10-CM

## 2023-06-26 DIAGNOSIS — I10 Essential (primary) hypertension: Secondary | ICD-10-CM | POA: Diagnosis not present

## 2023-06-26 NOTE — Progress Notes (Signed)
Virtual Visit via Video Note  I connected with DRUCELLA OCH on 06/26/23 at  3:20 PM EST by a video enabled telemedicine application and verified that I am speaking with the correct person using two identifiers.   I discussed the limitations of evaluation and management by telemedicine and the availability of in person appointments. The patient expressed understanding and agreed to proceed.  Patient location: at home Provider location: in office  Subjective:    CC:  No chief complaint on file.   HPI: Concerned because she is still having a lot of tightness and shortness of breath in her chest.  Also some postnasal drip drainage and intermittent cough.  She did have some viral testing in the ED that been hesitant to do a chest x-ray as it sounds like her lungs have sounded pretty clear.  She has not been using her albuterol she says sometimes when she uses it it makes her feel worse.  Sent D-dimer was negative and reassuring.  Says it feels like a band going around her waist.  And feels even discomfort going back between her shoulder blades.  She also reports that she still having numbness tingling and pin prick sensation down her left arm it is not necessarily in a specific distribution she just feels like it is the whole arm from the shoulder down.  Sometimes it does go all the way down to her hand.  She has been complaining more about this left arm ever since she fell and hit her head several months ago.  Still having frequent episodes of tachycardia more than usual again she wonders if it could be related to stress.  Her husband recently just had a heart catheterization and unfortunately her cat is most likely dying.  She still concerned about the swollen knots underneath her left axilla.   Past medical history, Surgical history, Family history not pertinant except as noted below, Social history, Allergies, and medications have been entered into the medical record, reviewed, and  corrections made.    Objective:    General: Speaking clearly in complete sentences without any shortness of breath.  Alert and oriented x3.  Normal judgment. No apparent acute distress.    Impression and Recommendations:    Problem List Items Addressed This Visit       Other   SOB (shortness of breath)   Other Visit Diagnoses     Arm paresthesia, left    -  Primary   Relevant Orders   Ambulatory referral to Neurology   Acute cough          Shortness of breath-it certainly could be related to her underlying asthma, it could also just be a viral illness.  She is also been under a lot of stress.  We discussed possibly using her peak flow to help her determine if she is truly having bronchospasm and then determine whether or not she needs to use her lev albuterol.  She does have some other symptoms such as the sinus drainage and mild cough.  Also discussed possibility of just using her albuterol 3 times daily and see if she improves and if she does taper down to twice a day and then taper down to once daily that is a reasonable approach as well just to see if this could be related to her underlying asthma.  Orders Placed This Encounter  Procedures   Ambulatory referral to Neurology    Referral Priority:   Routine    Referral Type:  Consultation    Referral Reason:   Specialty Services Required    Requested Specialty:   Neurology    Number of Visits Requested:   1    Left arm paresthesias-we did discuss the option of seeing Ortho/sports med this could be coming from a pinched nerve in the shoulder or in her neck.  She has had problems with that shoulder previously.  Or consider neurology referral for nerve conduction testing.  She would prefer to see neurology so we will go ahead and place a referral.  She says sometimes the whole arm just feels heavy like it is hard to hold it up as well.  No orders of the defined types were placed in this encounter.  Wrongly encouraged her to  schedule a mammogram especially if she is worried about the palpable knots in that left axilla.  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  Offered to schedule that at her convenience.  She has not had an up-to-date mammogram since 2022.  I discussed the assessment and treatment plan with the patient. The patient was provided an opportunity to ask questions and all were answered. The patient agreed with the plan and demonstrated an understanding of the instructions.   The patient was advised to call back or seek an in-person evaluation if the symptoms worsen or if the condition fails to improve as anticipated.  I spent 30 minutes on the day of the encounter to include pre-visit record review, face-to-face time with the patient and post visit ordering of test.   Nani Gasser, MD

## 2023-06-26 NOTE — Progress Notes (Signed)
Her lungs bothering her shallow breathing hurts in the front and shoulder blade hurts   She hasn't had a good nights sleep

## 2023-06-27 DIAGNOSIS — R079 Chest pain, unspecified: Secondary | ICD-10-CM | POA: Diagnosis not present

## 2023-06-28 DIAGNOSIS — K220 Achalasia of cardia: Secondary | ICD-10-CM | POA: Diagnosis not present

## 2023-06-28 DIAGNOSIS — Z8719 Personal history of other diseases of the digestive system: Secondary | ICD-10-CM | POA: Diagnosis not present

## 2023-06-28 DIAGNOSIS — Z1211 Encounter for screening for malignant neoplasm of colon: Secondary | ICD-10-CM | POA: Diagnosis not present

## 2023-06-28 DIAGNOSIS — R1310 Dysphagia, unspecified: Secondary | ICD-10-CM | POA: Diagnosis not present

## 2023-06-29 ENCOUNTER — Telehealth: Payer: Self-pay

## 2023-06-29 ENCOUNTER — Telehealth: Payer: Self-pay | Admitting: *Deleted

## 2023-06-29 ENCOUNTER — Ambulatory Visit: Payer: Medicare HMO | Admitting: Family Medicine

## 2023-06-29 DIAGNOSIS — I10 Essential (primary) hypertension: Secondary | ICD-10-CM | POA: Diagnosis not present

## 2023-06-29 MED ORDER — DIAZEPAM 2 MG PO TABS: 2.0000 mg | ORAL_TABLET | Freq: Four times a day (QID) | ORAL | 0 refills | Status: DC | PRN

## 2023-06-29 NOTE — Telephone Encounter (Signed)
Jennifer [name]'s cat just passed away. She would like a few tablets of Valium.

## 2023-06-29 NOTE — Telephone Encounter (Signed)
Meds ordered this encounter  Medications   diazepam (VALIUM) 2 MG tablet    Sig: Take 1 tablet (2 mg total) by mouth every 6 (six) hours as needed for anxiety.    Dispense:  10 tablet    Refill:  0

## 2023-06-29 NOTE — Telephone Encounter (Signed)
Patient called and stated "I need to move my appt on 11/25 to late December. My husband has had surgery and I had a death in the family." Appt moved to 12/30 at 2:45 pm.   Patient then stated "I need to give Dr Alvester Morin a message that I can't handle the cream anymore." Try to let the patient speak with the nurse this afternoon. Patient stated that "I have a cardiology appt today at 2:30pm and need a shower now. Please call me Monday morning before 10:30 am."   Message forwarded to Dr Alvester Morin

## 2023-07-03 ENCOUNTER — Other Ambulatory Visit: Payer: Self-pay

## 2023-07-03 ENCOUNTER — Telehealth: Payer: Self-pay | Admitting: Family

## 2023-07-03 DIAGNOSIS — E531 Pyridoxine deficiency: Secondary | ICD-10-CM | POA: Diagnosis not present

## 2023-07-03 DIAGNOSIS — Z79899 Other long term (current) drug therapy: Secondary | ICD-10-CM | POA: Diagnosis not present

## 2023-07-03 DIAGNOSIS — I10 Essential (primary) hypertension: Secondary | ICD-10-CM | POA: Diagnosis not present

## 2023-07-03 DIAGNOSIS — R002 Palpitations: Secondary | ICD-10-CM | POA: Diagnosis not present

## 2023-07-03 DIAGNOSIS — E782 Mixed hyperlipidemia: Secondary | ICD-10-CM | POA: Diagnosis not present

## 2023-07-03 DIAGNOSIS — I471 Supraventricular tachycardia, unspecified: Secondary | ICD-10-CM | POA: Diagnosis not present

## 2023-07-03 DIAGNOSIS — E611 Iron deficiency: Secondary | ICD-10-CM | POA: Diagnosis not present

## 2023-07-03 DIAGNOSIS — I4719 Other supraventricular tachycardia: Secondary | ICD-10-CM | POA: Diagnosis not present

## 2023-07-03 MED ORDER — FREESTYLE LITE TEST VI STRP: ORAL_STRIP | 99 refills | Status: DC

## 2023-07-03 MED ORDER — FREESTYLE LITE W/DEVICE KIT: PACK | 99 refills | Status: DC

## 2023-07-03 NOTE — Telephone Encounter (Signed)
She needs to be asking the physician that prescribed diazepam

## 2023-07-03 NOTE — Telephone Encounter (Signed)
Told patient to call or message her pcp to see if ok to take the diazepam 2 mg with her having asthma since her pcp was the prescriber. Patient currently having anxiety more than usual bc of the loss of her cat which she has had for a long time. Patient said she hasn't taken diazepam since she has had the covid and with her breathing being worse since covid she was afraid to take the diazepam. I told the patient again since we didn't prescribe the diazepam 2 mg to go thru her pcp. Patient said she just wouldn't take the diazepam. Patient said it's easier to get thru to [place] bc she already spoke with the pcp once today and takes a while to get thru to the pcp. Patient then says her O2 stat has been 92 when she is lying down. Told patient again to go thru her pcp may need a sleep study, then she went on that she may not see her pcp until February. I then closed our conversation. Patient was fine with that.

## 2023-07-03 NOTE — Telephone Encounter (Signed)
Patient called stating she was Prescribed diazepam from another provider patient is asking if she can take with current allergy medications asking for a nurse to call in reference to this issue

## 2023-07-03 NOTE — Telephone Encounter (Signed)
Glucometer and supplies sent to the pharmacy. It will probably need a PA.

## 2023-07-03 NOTE — Telephone Encounter (Signed)
Left message stating pt needed to reach out to who ever prescribed the diazepam for assurance of what she can take and not take with it

## 2023-07-04 DIAGNOSIS — H538 Other visual disturbances: Secondary | ICD-10-CM | POA: Diagnosis not present

## 2023-07-04 DIAGNOSIS — R9082 White matter disease, unspecified: Secondary | ICD-10-CM | POA: Diagnosis not present

## 2023-07-04 DIAGNOSIS — M542 Cervicalgia: Secondary | ICD-10-CM | POA: Diagnosis not present

## 2023-07-04 DIAGNOSIS — R519 Headache, unspecified: Secondary | ICD-10-CM | POA: Diagnosis not present

## 2023-07-04 DIAGNOSIS — G35 Multiple sclerosis: Secondary | ICD-10-CM | POA: Diagnosis not present

## 2023-07-04 NOTE — Telephone Encounter (Signed)
It looks like she has an appointment with Family medicine tomorrow at 11:30 AM

## 2023-07-05 ENCOUNTER — Telehealth (INDEPENDENT_AMBULATORY_CARE_PROVIDER_SITE_OTHER): Payer: Medicare HMO | Admitting: Family Medicine

## 2023-07-05 ENCOUNTER — Encounter: Payer: Self-pay | Admitting: Family Medicine

## 2023-07-05 DIAGNOSIS — F418 Other specified anxiety disorders: Secondary | ICD-10-CM

## 2023-07-05 DIAGNOSIS — G4734 Idiopathic sleep related nonobstructive alveolar hypoventilation: Secondary | ICD-10-CM

## 2023-07-05 DIAGNOSIS — I10 Essential (primary) hypertension: Secondary | ICD-10-CM | POA: Diagnosis not present

## 2023-07-05 DIAGNOSIS — F411 Generalized anxiety disorder: Secondary | ICD-10-CM | POA: Diagnosis not present

## 2023-07-05 DIAGNOSIS — R9431 Abnormal electrocardiogram [ECG] [EKG]: Secondary | ICD-10-CM | POA: Diagnosis not present

## 2023-07-05 MED ORDER — HYDROXYZINE HCL 10 MG PO TABS: 5.0000 mg | ORAL_TABLET | Freq: Every day | ORAL | 0 refills | Status: DC | PRN

## 2023-07-05 NOTE — Progress Notes (Signed)
Pt stated that she needed to talk about her O2 stats dropping at night

## 2023-07-05 NOTE — Assessment & Plan Note (Signed)
Went to ED last night

## 2023-07-05 NOTE — Assessment & Plan Note (Signed)
Feel more comfortable using hydroxyzine she is also taken that in the past but found that it was very sedating.  I looked back in her chart and saw that she was given a prescription years ago for 25 mg so we will try the 10 mg and she can even split that in half if she would like.

## 2023-07-05 NOTE — Progress Notes (Signed)
Virtual Visit via Video Note  I connected with Jennifer [name] on 07/05/23 at 11:30 AM EST by a video enabled telemedicine application and verified that I am speaking with the correct person using two identifiers.   I discussed the limitations of evaluation and management by telemedicine and the availability of in person appointments. The patient expressed understanding and agreed to proceed.  Patient location: at home Provider location: in office  Subjective:    CC:   Chief Complaint  Patient presents with   Medical Management of Chronic Issues    HPI: Has concerns about the Valium, worried about respiratory depression.  She Trelegy tried to call the allergist first and they recommended that she follow-up with me since I wrote the original prescription.  She had taken it maybe once years before.  Went to ED last night bc of high BP and headache.  They did do a head CT.. Saw. Dr. Charm Barges and recommended seh f/u with Neurology   Ports she is also noticed that sometimes her oxygen drops a little at night.  She has been evaluated for sleep apnea in the past and had some desaturations but no significant apneas.  Last test was around 2015  Past medical history, Surgical history, Family history not pertinant except as noted below, Social history, Allergies, and medications have been entered into the medical record, reviewed, and corrections made.    Objective:    General: Speaking clearly in complete sentences without any shortness of breath.  Alert and oriented x3.  Normal judgment. No apparent acute distress.    Impression and Recommendations:    Problem List Items Addressed This Visit       Cardiovascular and Mediastinum   Hypertension with intolerance to multiple antihypertensive drugs    Went to ED last night.         Other   GAD (generalized anxiety disorder)    Feel more comfortable using hydroxyzine she is also taken that in the past but found that it was very  sedating.  I looked back in her chart and saw that she was given a prescription years ago for 25 mg so we will try the 10 mg and she can even split that in half if she would like.      Relevant Medications   hydrOXYzine (ATARAX) 10 MG tablet   Depression with anxiety   Relevant Medications   hydrOXYzine (ATARAX) 10 MG tablet   Other Visit Diagnoses     Nocturnal oxygen desaturation    -  Primary   Relevant Orders   Home sleep test       Would like to get an updated home sleep study.  Order placed today.  Orders Placed This Encounter  Procedures   Home sleep test    Order Specific Question:   Where should this test be performed:    Answer:   [facility]    Meds ordered this encounter  Medications   hydrOXYzine (ATARAX) 10 MG tablet    Sig: Take 0.5-1 tablets (5-10 mg total) by mouth daily as needed for anxiety.    Dispense:  15 tablet    Refill:  0    I discussed the assessment and treatment plan with the patient. The patient was provided an opportunity to ask questions and all were answered. The patient agreed with the plan and demonstrated an understanding of the instructions.   The patient was advised to call back or seek an in-person evaluation  if the symptoms worsen or if the condition fails to improve as anticipated.  I spent 20 minutes on the day of the encounter to include pre-visit record review, face-to-face time with the patient and post visit ordering of test.   Nani Gasser, MD

## 2023-07-06 DIAGNOSIS — K5790 Diverticulosis of intestine, part unspecified, without perforation or abscess without bleeding: Secondary | ICD-10-CM | POA: Diagnosis not present

## 2023-07-06 DIAGNOSIS — R1032 Left lower quadrant pain: Secondary | ICD-10-CM | POA: Diagnosis not present

## 2023-07-06 DIAGNOSIS — K5730 Diverticulosis of large intestine without perforation or abscess without bleeding: Secondary | ICD-10-CM | POA: Diagnosis not present

## 2023-07-06 DIAGNOSIS — Z87891 Personal history of nicotine dependence: Secondary | ICD-10-CM | POA: Diagnosis not present

## 2023-07-06 DIAGNOSIS — N281 Cyst of kidney, acquired: Secondary | ICD-10-CM | POA: Diagnosis not present

## 2023-07-07 DIAGNOSIS — K5730 Diverticulosis of large intestine without perforation or abscess without bleeding: Secondary | ICD-10-CM | POA: Diagnosis not present

## 2023-07-07 DIAGNOSIS — N281 Cyst of kidney, acquired: Secondary | ICD-10-CM | POA: Diagnosis not present

## 2023-07-09 ENCOUNTER — Ambulatory Visit: Payer: Medicare HMO | Admitting: Psychiatry

## 2023-07-09 DIAGNOSIS — G4489 Other headache syndrome: Secondary | ICD-10-CM | POA: Diagnosis not present

## 2023-07-09 DIAGNOSIS — R42 Dizziness and giddiness: Secondary | ICD-10-CM | POA: Diagnosis not present

## 2023-07-09 DIAGNOSIS — R002 Palpitations: Secondary | ICD-10-CM | POA: Diagnosis not present

## 2023-07-09 DIAGNOSIS — R000 Tachycardia, unspecified: Secondary | ICD-10-CM | POA: Diagnosis not present

## 2023-07-09 DIAGNOSIS — R0689 Other abnormalities of breathing: Secondary | ICD-10-CM | POA: Diagnosis not present

## 2023-07-09 DIAGNOSIS — I10 Essential (primary) hypertension: Secondary | ICD-10-CM | POA: Diagnosis not present

## 2023-07-10 ENCOUNTER — Other Ambulatory Visit: Payer: Self-pay

## 2023-07-10 ENCOUNTER — Encounter (HOSPITAL_BASED_OUTPATIENT_CLINIC_OR_DEPARTMENT_OTHER): Payer: Self-pay | Admitting: Urology

## 2023-07-10 ENCOUNTER — Emergency Department (HOSPITAL_BASED_OUTPATIENT_CLINIC_OR_DEPARTMENT_OTHER)
Admission: EM | Admit: 2023-07-10 | Discharge: 2023-07-11 | Disposition: A | Discharge status: Home / Self Care | Payer: Medicare HMO | Attending: Emergency Medicine | Admitting: Emergency Medicine

## 2023-07-10 DIAGNOSIS — Z8542 Personal history of malignant neoplasm of other parts of uterus: Secondary | ICD-10-CM | POA: Diagnosis not present

## 2023-07-10 DIAGNOSIS — B9789 Other viral agents as the cause of diseases classified elsewhere: Secondary | ICD-10-CM | POA: Diagnosis not present

## 2023-07-10 DIAGNOSIS — Z20822 Contact with and (suspected) exposure to covid-19: Secondary | ICD-10-CM | POA: Diagnosis not present

## 2023-07-10 DIAGNOSIS — E119 Type 2 diabetes mellitus without complications: Secondary | ICD-10-CM | POA: Insufficient documentation

## 2023-07-10 DIAGNOSIS — J029 Acute pharyngitis, unspecified: Secondary | ICD-10-CM | POA: Diagnosis not present

## 2023-07-10 DIAGNOSIS — R0982 Postnasal drip: Secondary | ICD-10-CM | POA: Diagnosis not present

## 2023-07-10 DIAGNOSIS — J028 Acute pharyngitis due to other specified organisms: Secondary | ICD-10-CM | POA: Diagnosis not present

## 2023-07-10 DIAGNOSIS — J069 Acute upper respiratory infection, unspecified: Secondary | ICD-10-CM | POA: Diagnosis not present

## 2023-07-10 DIAGNOSIS — J45909 Unspecified asthma, uncomplicated: Secondary | ICD-10-CM | POA: Diagnosis not present

## 2023-07-10 LAB — GROUP A STREP BY PCR: Group A Strep by PCR: NOT DETECTED

## 2023-07-10 NOTE — ED Triage Notes (Signed)
Pt states feels like throat is swelling and is having trouble breathing  States it started last night and " it is in the back and the uvula is swollen" states it hurts to swallow    Just took home meds pta for BP and HR will repeat vs

## 2023-07-11 MED ORDER — ACETAMINOPHEN 500 MG PO TABS
1000.0000 mg | ORAL_TABLET | Freq: Once | ORAL | Status: DC
  Filled 2023-07-11: qty 2

## 2023-07-11 MED ORDER — ALUM & MAG HYDROXIDE-SIMETH 200-200-20 MG/5ML PO SUSP
30.0000 mL | Freq: Once | ORAL | Status: AC
  Administered 2023-07-11: 30 mL via ORAL
  Filled 2023-07-11: qty 30
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
## 2023-07-11 NOTE — Discharge Instructions (Signed)
You were evaluated in the Emergency Department and after careful evaluation, we did not find any emergent condition requiring admission or further testing in the hospital.  Your exam/testing today is overall reassuring.  Symptoms likely due to viral illness, recommend Tylenol or Motrin for discomfort.  Please return to the Emergency Department if you experience any worsening of your condition.   Thank you for allowing [place] to be a part of your care.

## 2023-07-12 DIAGNOSIS — R221 Localized swelling, mass and lump, neck: Secondary | ICD-10-CM | POA: Diagnosis not present

## 2023-07-12 DIAGNOSIS — R059 Cough, unspecified: Secondary | ICD-10-CM | POA: Diagnosis not present

## 2023-07-12 DIAGNOSIS — J029 Acute pharyngitis, unspecified: Secondary | ICD-10-CM | POA: Diagnosis not present

## 2023-07-15 DIAGNOSIS — K122 Cellulitis and abscess of mouth: Secondary | ICD-10-CM | POA: Diagnosis not present

## 2023-07-15 DIAGNOSIS — R070 Pain in throat: Secondary | ICD-10-CM | POA: Diagnosis not present

## 2023-07-15 DIAGNOSIS — Z20822 Contact with and (suspected) exposure to covid-19: Secondary | ICD-10-CM | POA: Diagnosis not present

## 2023-07-16 ENCOUNTER — Telehealth: Payer: Self-pay

## 2023-07-16 DIAGNOSIS — F411 Generalized anxiety disorder: Secondary | ICD-10-CM

## 2023-07-16 NOTE — Transitions of Care (Post Inpatient/ED Visit) (Signed)
07/16/2023  Name: Jennifer [name] MRN: [id number] DOB: [birth year]  Today's TOC FU Call Status: Today's TOC FU Call Status:: Successful TOC FU Call Completed TOC FU Call Complete Date: 07/16/23 Patient's Name and Date of Birth confirmed.  Transition Care Management Follow-up Telephone Call Date of Discharge: 07/15/23 Discharge Facility: [facility] Type of Discharge: Emergency Department Reason for ED Visit: Other: (throat pain) How have you been since you were released from the hospital?: Same Any questions or concerns?: No  Items Reviewed: Did you receive and understand the discharge instructions provided?: Yes Medications obtained,verified, and reconciled?: Yes (Medications Reviewed) Any new allergies since your discharge?: No Dietary orders reviewed?: Yes Do you have support at home?: No  Medications Reviewed Today: Medications Reviewed Today     Reviewed by Karena Addison, LPN (Licensed Practical Nurse) on 07/16/23 at 1743  Med List Status: <None>   Medication Order Taking? Sig Documenting Provider Last Dose Status Informant  AMBULATORY NON FORMULARY MEDICATION [id number] No Medication Name: standing order BMP x 1 year Agapito Games, MD Taking Active   AMBULATORY Clent [place]ark MEDICATION [id number] No Medication Name: Standing order for CBC/diff (monthly) x 1 year Agapito Games, MD Taking Active   azelastine (ASTELIN) 0.1 % nasal spray [id number] No Place 1 spray in each nostril twice a day as needed for runny nose/drainage down throat Nehemiah Settle, FNP Taking Active   blood glucose meter kit and supplies [id number] No Dispense based on patient and insurance preference. Use to check blood sugars twice daily E11.9 Agapito Games, MD Taking Active   Blood Glucose Monitoring Suppl (FREESTYLE LITE) w/Device KIT [id number]  Use to check blood glucose twice daily. Agapito Games, MD  Active   EPINEPHrine (EPIPEN 2-PAK) 0.3 mg/0.3 mL IJ SOAJ  injection [id number] No Inject 0.3 mg into the muscle as needed for anaphylaxis. Verlee Monte, MD Taking Active   famotidine (PEPCID) 40 MG tablet [id number] No Take 1 tablet (40 mg total) by mouth 2 (two) times daily. Agapito Games, MD Taking Active   fluticasone [facility]) 50 MCG/ACT nasal spray [id number] No Place 1 spray into both nostrils daily. Agapito Games, MD Taking Active   glucose blood (FREESTYLE LITE) test strip [id number]  Use to check blood glucose twice daily. Agapito Games, MD  Active   hydrOXYzine (ATARAX) 10 MG tablet [id number]  Take 0.5-1 tablets (5-10 mg total) by mouth daily as needed for anxiety. Agapito Games, MD  Active   Lancet Device MISC [id number] No 1 each by Does not apply route daily. May substitute to any manufacturer covered by patient's insurance. Agapito Games, MD Taking Active   Lancets Misc. MISC [id number] No 1 each by Does not apply route daily. May substitute to any manufacturer covered by patient's insurance. Agapito Games, MD Taking Active   levalbuterol [facility] HFA) 45 MCG/ACT inhaler [id number] No Inhale 1 puff into the lungs every 4 (four) hours as needed for wheezing or shortness of breath. Verlee Monte, MD Taking Active   levalbuterol Pauline Aus) 1.25 MG/3ML nebulizer solution [id number] No Take 1.25 mg by nebulization every 4 (four) hours as needed for wheezing. Verlee Monte, MD Taking Active   metoprolol tartrate (LOPRESSOR) 12.5 mg TABS tablet [id number] No Take 12.5 mg by mouth 2 (two) times daily. [provider] Taking Active   metoprolol tartrate (LOPRESSOR) 25 MG tablet [id number] No Take by mouth. [provider] Taking Active   potassium chloride (KLOR-CON) 20 MEQ packet  [id number] No Take 20 mEq by mouth daily. Agapito Games, MD Taking Active   Med List Note Linford Arnold, Barbarann Ehlers, MD 08/29/21 1622):               Home Care and Equipment/Supplies: Were Home Health  Services Ordered?: NA Any new equipment or medical supplies ordered?: NA  Functional Questionnaire: Do you need assistance with bathing/showering or dressing?: No Do you need assistance with meal preparation?: No Do you need assistance with eating?: No Do you have difficulty maintaining continence: No Do you need assistance with getting out of bed/getting out of a chair/moving?: No Do you have difficulty managing or taking your medications?: No  Follow up appointments reviewed: PCP Follow-up appointment confirmed?: Yes Date of PCP follow-up appointment?: 07/20/23 Follow-up Provider: [facility] Follow-up appointment confirmed?: NA Do you need transportation to your follow-up appointment?: No Do you understand care options if your condition(s) worsen?: Yes-patient verbalized understanding    SIGNATURE Karena Addison, LPN [facility] Nurse Health Advisor Direct Dial [phone]

## 2023-07-16 NOTE — Telephone Encounter (Signed)
Jennifer [name] wants a therapist that does virtual visits ASAP. She just wants a regular therapist. No trama therapist.   Pended referral.

## 2023-07-16 NOTE — Telephone Encounter (Signed)
Orders Placed This Encounter  Procedures   Ambulatory referral to Behavioral Health    Referral Priority:   Routine    Referral Type:   Psychiatric    Referral Reason:   Specialty Services Required    Requested Specialty:   Behavioral Health    Number of Visits Requested:   1    

## 2023-07-17 ENCOUNTER — Telehealth: Payer: Self-pay | Admitting: *Deleted

## 2023-07-17 DIAGNOSIS — E531 Pyridoxine deficiency: Secondary | ICD-10-CM | POA: Diagnosis not present

## 2023-07-17 DIAGNOSIS — R0789 Other chest pain: Secondary | ICD-10-CM | POA: Diagnosis not present

## 2023-07-17 DIAGNOSIS — N281 Cyst of kidney, acquired: Secondary | ICD-10-CM | POA: Diagnosis not present

## 2023-07-17 DIAGNOSIS — I471 Supraventricular tachycardia, unspecified: Secondary | ICD-10-CM | POA: Diagnosis not present

## 2023-07-17 DIAGNOSIS — R002 Palpitations: Secondary | ICD-10-CM | POA: Diagnosis not present

## 2023-07-17 DIAGNOSIS — I4719 Other supraventricular tachycardia: Secondary | ICD-10-CM | POA: Diagnosis not present

## 2023-07-17 DIAGNOSIS — E611 Iron deficiency: Secondary | ICD-10-CM | POA: Diagnosis not present

## 2023-07-17 DIAGNOSIS — I10 Essential (primary) hypertension: Secondary | ICD-10-CM | POA: Diagnosis not present

## 2023-07-17 DIAGNOSIS — E782 Mixed hyperlipidemia: Secondary | ICD-10-CM | POA: Diagnosis not present

## 2023-07-17 DIAGNOSIS — R0989 Other specified symptoms and signs involving the circulatory and respiratory systems: Secondary | ICD-10-CM | POA: Diagnosis not present

## 2023-07-17 NOTE — Telephone Encounter (Signed)
Patient advised.

## 2023-07-17 NOTE — Telephone Encounter (Signed)
Copied from CRM [id number]. Topic: General - Other >> Jul 17, 2023 11:57 AM Almira Coaster wrote: Reason for CRM: Patient would like to follow up with [name] regarding their conversation yesterday.

## 2023-07-17 NOTE — Telephone Encounter (Signed)
Patient advised. See other telephone message.

## 2023-07-17 NOTE — Telephone Encounter (Signed)
Will fwd to [employer]. RMA

## 2023-07-18 ENCOUNTER — Telehealth: Payer: Self-pay

## 2023-07-18 DIAGNOSIS — R079 Chest pain, unspecified: Secondary | ICD-10-CM | POA: Diagnosis not present

## 2023-07-18 DIAGNOSIS — R000 Tachycardia, unspecified: Secondary | ICD-10-CM | POA: Diagnosis not present

## 2023-07-18 NOTE — Transitions of Care (Post Inpatient/ED Visit) (Signed)
   07/18/2023  Name: DEONE CIOLINO MRN: [id number] DOB: Jul 11, 1966  Today's TOC FU Call Status: Today's TOC FU Call Status:: Unsuccessful Call (1st Attempt) Unsuccessful Call (1st Attempt) Date: 07/18/23  Attempted to reach the patient regarding the most recent Inpatient/ED visit.  Follow Up Plan: Additional outreach attempts will be made to reach the patient to complete the Transitions of Care (Post Inpatient/ED visit) call.   Signature Karena Addison, LPN [facility] Nurse Health Advisor Direct Dial [phone]

## 2023-07-19 DIAGNOSIS — R000 Tachycardia, unspecified: Secondary | ICD-10-CM | POA: Diagnosis not present

## 2023-07-19 DIAGNOSIS — F4312 Post-traumatic stress disorder, chronic: Secondary | ICD-10-CM | POA: Diagnosis not present

## 2023-07-19 DIAGNOSIS — R0789 Other chest pain: Secondary | ICD-10-CM | POA: Diagnosis not present

## 2023-07-19 DIAGNOSIS — I10 Essential (primary) hypertension: Secondary | ICD-10-CM | POA: Diagnosis not present

## 2023-07-19 DIAGNOSIS — C55 Malignant neoplasm of uterus, part unspecified: Secondary | ICD-10-CM | POA: Diagnosis not present

## 2023-07-19 DIAGNOSIS — R600 Localized edema: Secondary | ICD-10-CM | POA: Diagnosis not present

## 2023-07-19 NOTE — Transitions of Care (Post Inpatient/ED Visit) (Signed)
   07/19/2023  Name: ALAENA CAUL MRN: [id number] DOB: [birth year]  Today's TOC FU Call Status: Today's TOC FU Call Status:: Unsuccessful Call (2nd Attempt) Unsuccessful Call (1st Attempt) Date: 07/18/23 Unsuccessful Call (2nd Attempt) Date: 07/19/23  Attempted to reach the patient regarding the most recent Inpatient/ED visit.  Follow Up Plan: Additional outreach attempts will be made to reach the patient to complete the Transitions of Care (Post Inpatient/ED visit) call.   Signature Karena Addison, LPN [facility] Nurse Health Advisor Direct Dial [phone]

## 2023-07-20 ENCOUNTER — Ambulatory Visit (INDEPENDENT_AMBULATORY_CARE_PROVIDER_SITE_OTHER): Payer: Medicare HMO | Admitting: Family Medicine

## 2023-07-20 ENCOUNTER — Encounter: Payer: Self-pay | Admitting: Family Medicine

## 2023-07-20 VITALS — BP 161/85 | HR 78 | Ht 62.0 in | Wt 230.0 lb

## 2023-07-20 DIAGNOSIS — I10 Essential (primary) hypertension: Secondary | ICD-10-CM | POA: Diagnosis not present

## 2023-07-20 DIAGNOSIS — T17308A Unspecified foreign body in larynx causing other injury, initial encounter: Secondary | ICD-10-CM

## 2023-07-20 DIAGNOSIS — F331 Major depressive disorder, recurrent, moderate: Secondary | ICD-10-CM

## 2023-07-20 DIAGNOSIS — E118 Type 2 diabetes mellitus with unspecified complications: Secondary | ICD-10-CM | POA: Diagnosis not present

## 2023-07-20 DIAGNOSIS — R252 Cramp and spasm: Secondary | ICD-10-CM

## 2023-07-20 LAB — POCT GLYCOSYLATED HEMOGLOBIN (HGB A1C): Hemoglobin A1C: 6.6 % — AB (ref 4.0–5.6)

## 2023-07-20 LAB — POCT URINALYSIS DIP (CLINITEK)
Bilirubin, UA: NEGATIVE
Blood, UA: NEGATIVE
Glucose, UA: NEGATIVE mg/dL
Leukocytes, UA: NEGATIVE
Nitrite, UA: NEGATIVE
POC PROTEIN,UA: NEGATIVE
Spec Grav, UA: 1.025 (ref 1.010–1.025)
Urobilinogen, UA: 0.2 U/dL
pH, UA: 5.5 (ref 5.0–8.0)

## 2023-07-20 NOTE — Assessment & Plan Note (Addendum)
Will meet with new therapist coming up next week.  I was glad she was able to get in that quickly.

## 2023-07-20 NOTE — Assessment & Plan Note (Signed)
Sitter prazosin.  She has not tried that particular medication before.  She is worried about potential tachycardia.
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
## 2023-07-20 NOTE — Assessment & Plan Note (Signed)
A1c 6.6 she has not tried the metformin yet.  She really wants to continue to work on diet and exercise for the next several months.  In fact she is already lost 2 pounds I think that is reasonable.  Plan to recheck in 3 months.

## 2023-07-21 LAB — VITAMIN B12: Vitamin B-12: 448 pg/mL (ref 232–1245)

## 2023-07-21 LAB — MAGNESIUM: Magnesium: 2.1 mg/dL (ref 1.6–2.3)

## 2023-07-23 ENCOUNTER — Telehealth: Payer: Self-pay | Admitting: Family Medicine

## 2023-07-23 DIAGNOSIS — E876 Hypokalemia: Secondary | ICD-10-CM | POA: Diagnosis not present

## 2023-07-23 DIAGNOSIS — R0683 Snoring: Secondary | ICD-10-CM

## 2023-07-23 DIAGNOSIS — R000 Tachycardia, unspecified: Secondary | ICD-10-CM | POA: Diagnosis not present

## 2023-07-23 DIAGNOSIS — R079 Chest pain, unspecified: Secondary | ICD-10-CM | POA: Diagnosis not present

## 2023-07-23 DIAGNOSIS — R0789 Other chest pain: Secondary | ICD-10-CM | POA: Diagnosis not present

## 2023-07-23 DIAGNOSIS — I10 Essential (primary) hypertension: Secondary | ICD-10-CM | POA: Diagnosis not present

## 2023-07-23 NOTE — Telephone Encounter (Signed)
Dr. Linford Arnold, can you e-enter the orders for Home Sleep Study as future orders? Previous order auto completed and can no longer be scheduled. Please advise.

## 2023-07-23 NOTE — Progress Notes (Signed)
Magnesium and vitamin B12 levels look good.

## 2023-07-24 ENCOUNTER — Telehealth: Payer: Self-pay

## 2023-07-24 NOTE — Telephone Encounter (Signed)
Thank you- new order placed!

## 2023-07-24 NOTE — Transitions of Care (Post Inpatient/ED Visit) (Signed)
   07/24/2023  Name: Jennifer [name] MRN: [id number] DOB: 1966-07-10  Today's TOC FU Call Status: Today's TOC FU Call Status:: Unsuccessful Call (2nd Attempt) Unsuccessful Call (1st Attempt) Date: 07/18/23 Unsuccessful Call (2nd Attempt) Date: 07/19/23  Attempted to reach the patient regarding the most recent Inpatient/ED visit.  Follow Up Plan: No further outreach attempts will be made at this time. We have been unable to contact the patient.  Signature Karena Addison, LPN [facility] Nurse Health Advisor Direct Dial [phone]

## 2023-07-24 NOTE — Transitions of Care (Post Inpatient/ED Visit) (Signed)
   07/24/2023  Name: Jennifer [name] MRN: [id number] DOB: Dec 31, 1965  Today's TOC FU Call Status: Today's TOC FU Call Status:: Unsuccessful Call (1st Attempt) Unsuccessful Call (1st Attempt) Date: 07/24/23  Attempted to reach the patient regarding the most recent Inpatient/ED visit.  Follow Up Plan: Additional outreach attempts will be made to reach the patient to complete the Transitions of Care (Post Inpatient/ED visit) call.   Signature Karena Addison, LPN [facility] [place] Nurse Health Advisor Direct Dial [phone]

## 2023-07-25 ENCOUNTER — Ambulatory Visit: Payer: Medicare HMO | Admitting: Licensed Clinical Social Worker

## 2023-07-25 ENCOUNTER — Telehealth: Payer: Medicare HMO | Admitting: Family Medicine

## 2023-07-25 ENCOUNTER — Ambulatory Visit: Payer: Self-pay | Admitting: Family Medicine

## 2023-07-25 DIAGNOSIS — M25561 Pain in right knee: Secondary | ICD-10-CM | POA: Diagnosis not present

## 2023-07-25 DIAGNOSIS — F411 Generalized anxiety disorder: Secondary | ICD-10-CM | POA: Diagnosis not present

## 2023-07-25 DIAGNOSIS — F32A Depression, unspecified: Secondary | ICD-10-CM

## 2023-07-25 DIAGNOSIS — F4310 Post-traumatic stress disorder, unspecified: Secondary | ICD-10-CM | POA: Diagnosis not present

## 2023-07-25 DIAGNOSIS — M25461 Effusion, right knee: Secondary | ICD-10-CM | POA: Diagnosis not present

## 2023-07-25 DIAGNOSIS — F331 Major depressive disorder, recurrent, moderate: Secondary | ICD-10-CM

## 2023-07-25 DIAGNOSIS — M79604 Pain in right leg: Secondary | ICD-10-CM | POA: Diagnosis not present
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
## 2023-07-25 NOTE — Progress Notes (Signed)
Virtual Visit via Video Note  I connected with HAVISHA SALCEDO on 07/25/23 at 10:50 AM EST by a video enabled telemedicine application and verified that I am speaking with the correct person using two identifiers.   I discussed the limitations of evaluation and management by telemedicine and the availability of in person appointments. The patient expressed understanding and agreed to proceed.  Patient location: at home Provider location: in office  Subjective:    CC:  No chief complaint on file.   HPI: She reports that not too long ago she was in the car and had some sudden discomfort in her right knee it felt a little tender that day.  But she did okay until suddenly yesterday that right knee became much more painful she has had some swelling medially wrapping around to the back of the knee.  Now pain is radiating anteriorly just above the knee and posteriorly down into the calf muscle towards her foot.  She says it feels tight almost like a compression sleeve.  And the foot has felt a little numb and tingly.  She has been trying to keep it comfortable as possible it is worse if she puts weight on it or walks or if she completely flexes the knee.  She has not tried ice yet.  No new trauma or injury yesterday.   Past medical history, Surgical history, Family history not pertinant except as noted below, Social history, Allergies, and medications have been entered into the medical record, reviewed, and corrections made.    Objective:    General: Speaking clearly in complete sentences without any shortness of breath.  Alert and oriented x3.  Normal judgment. No apparent acute distress.    Impression and Recommendations:    Problem List Items Addressed This Visit   None Visit Diagnoses     Acute pain of right knee    -  Primary   Pain and swelling of right knee       Relevant Orders   DG Knee Complete 4 Views Right      Right Knee pain and swelling-I would like to get an  x-ray should like to have it done at [facility] in [facility] will have it faxed there recommend RICE therapy with rest, ice, compression with Ace wrap and elevation.  At this point I do not hear anything worrisome to suspect other conditions such as a clot etc.  Orders Placed This Encounter  Procedures   DG Knee Complete 4 Views Right    Standing Status:   Future    Standing Expiration Date:   07/24/2024    Scheduling Instructions:     [facility]    Order Specific Question:   Reason for Exam (SYMPTOM  OR DIAGNOSIS REQUIRED)    Answer:   R knee pain, swelling    Order Specific Question:   Is patient pregnant?    Answer:   Yes    Order Specific Question:   Preferred imaging location?    Answer:   External    No orders of the defined types were placed in this encounter.    I discussed the assessment and treatment plan with the patient. The patient was provided an opportunity to ask questions and all were answered. The patient agreed with the plan and demonstrated an understanding of the instructions.   The patient was advised to call back or seek an in-person evaluation if the symptoms worsen or if the condition fails  to improve as anticipated.    Nani Gasser, MD

## 2023-07-25 NOTE — Telephone Encounter (Signed)
Copied from CRM [id number]. Topic: Clinical - Red Word Triage >> Jul 25, 2023  9:25 AM Mosetta Putt H wrote: Reason for CRM: patient is having pain in right knee pain level 9/10   Chief Complaint: Right knee pain Symptoms: pain, localized swelling, and stiffness Frequency: constant, worsening since yesterday Pertinent Negatives: Patient denies recent falls Disposition: [] ED /[] Urgent Care (no appt availability in office) / [x] Appointment(In office/virtual)/ []  [facility]/ [] Home Care/ [] Refused Recommended Disposition /[] [facility]/ []  Follow-up with PCP Additional Notes: Patient reports R knee pain sinc yesterday, unreleived with Tylenol. Pt sts that  she had a heart cath in her Right leg, so she is concerned about a blood clot. RN advised that patient should be seen today, patient requesting virtual visit due to timing and discomfort. Virtual visit schedule for 12/11 at 10:50a     Reason for Disposition  [1] SEVERE pain (e.g., excruciating, unable to walk) AND [2] not improved after 2 hours of pain medicine  Answer Assessment - Initial Assessment Questions 1. LOCATION and RADIATION: "Where is the pain located?"      Usually pain is in left leg, now its on the right  2. QUALITY: "What does the pain feel like?"  (e.g., sharp, dull, aching, burning)     Tingling, tight, and feels like fluid. Swollen on inner side of knee. Kneecap is very achy, unrelieved with tylenol  3. SEVERITY: "How bad is the pain?" "What does it keep you from doing?"   (Scale 1-10; or mild, moderate, severe)   -  MILD (1-3): doesn't interfere with normal activities    -  MODERATE (4-7): interferes with normal activities (e.g., work or school) or awakens from sleep, limping    -  SEVERE (8-10): excruciating pain, unable to do any normal activities, unable to walk     9/10- feels stiff when trying to bend  4. ONSET: "When did the pain start?" "Does it come and go, or is it there all the time?"      Pain started yesterday  5. RECURRENT: "Have you had this pain before?" If Yes, ask: "When, and what happened then?"     Yes, this is the leg patient had heart cath   6. SETTING: "Has there been any recent work, exercise or other activity that involved that part of the body?"      Has had falls in the past, but nothing recently.   7. AGGRAVATING FACTORS: "What makes the knee pain worse?" (e.g., walking, climbing stairs, running)     Bending aggravates symptoms  8. ASSOCIATED SYMPTOMS: "Is there any swelling or redness of the knee?"     Swelling only, a little red on backside of leg  9. OTHER SYMPTOMS: "Do you have any other symptoms?" (e.g., chest pain, difficulty breathing, fever, calf pain)     No calf pain, occasional shortness of breath, but states that she is sick and is also asthmatic  Protocols used: Knee Pain-A-AH

## 2023-07-26 ENCOUNTER — Telehealth: Payer: Self-pay

## 2023-07-26 DIAGNOSIS — R079 Chest pain, unspecified: Secondary | ICD-10-CM | POA: Diagnosis not present

## 2023-07-26 NOTE — Telephone Encounter (Signed)
Copied from CRM [id number]. Topic: Clinical - Prescription Issue >> Jul 25, 2023 12:43 PM Dimitri Ped wrote: Reason for CRM: Candice from [facility] is calling about a refill for this patient . Wants to know did the request come through for freestyle libre 2 sensor and a libre reader . Call back number for [facility] [phone]

## 2023-07-26 NOTE — Telephone Encounter (Signed)
Did not see either freestyle libre 2 sensor or reader in patient current medications. Sent to provider for review.

## 2023-07-27 ENCOUNTER — Other Ambulatory Visit: Payer: Self-pay

## 2023-07-27 DIAGNOSIS — E118 Type 2 diabetes mellitus with unspecified complications: Secondary | ICD-10-CM

## 2023-07-27 MED ORDER — FREESTYLE LIBRE 2 SENSOR MISC: 1.0000 | 1 refills | Status: DC

## 2023-07-27 MED ORDER — FREESTYLE LIBRE 2 READER DEVI: 1.0000 | Freq: Every day | 0 refills | Status: DC

## 2023-07-27 NOTE — Telephone Encounter (Signed)
Sent freestyle libre 2  prescriptions to [facility] .

## 2023-07-27 NOTE — Telephone Encounter (Signed)
I never saw a request either.  Ok for [employer] fill freestyle libre 2 if insurance will cover she is diabetic but not on insulin.

## 2023-07-30 ENCOUNTER — Telehealth: Payer: Self-pay

## 2023-07-30 DIAGNOSIS — M25561 Pain in right knee: Secondary | ICD-10-CM

## 2023-07-30 NOTE — Telephone Encounter (Signed)
Jennifer [name] states she is still having right knee pain. She is wanting to know the next steps.   She also wants to know what to do for the pain. She has tried ibuprofen without relief. The pain is worse when her knee is bent.

## 2023-07-30 NOTE — Telephone Encounter (Signed)
She was supposed to come by and have x-rays but I do not see that on file.  I wanted to make sure that we were okay to proceed with physical therapy so wanted to get the plain film first.

## 2023-07-31 ENCOUNTER — Telehealth: Payer: Self-pay

## 2023-07-31 DIAGNOSIS — H9202 Otalgia, left ear: Secondary | ICD-10-CM | POA: Diagnosis not present

## 2023-07-31 DIAGNOSIS — R0789 Other chest pain: Secondary | ICD-10-CM | POA: Diagnosis not present

## 2023-07-31 DIAGNOSIS — M25561 Pain in right knee: Secondary | ICD-10-CM

## 2023-07-31 DIAGNOSIS — I10 Essential (primary) hypertension: Secondary | ICD-10-CM | POA: Diagnosis not present

## 2023-07-31 DIAGNOSIS — Z87891 Personal history of nicotine dependence: Secondary | ICD-10-CM | POA: Diagnosis not present

## 2023-07-31 NOTE — Telephone Encounter (Signed)
Spoke with patient. States had x-rays done at [facility] - these have resulted and placed in your box for review. She states she will not do PT  as she does not want to push her body and feels her activity has increased - also states she cannot afford to go  to PT - she is requesting to continue with MRI order for further evalutation.

## 2023-07-31 NOTE — Telephone Encounter (Signed)
Orders Placed This Encounter  Procedures   MR Knee Right Wo Contrast    Standing Status:   Future    Expiration Date:   07/30/2024    What is the patient's sedation requirement?:   No Sedation    Does the patient have a pacemaker or implanted devices?:   No    Preferred imaging location?:   [facility] (table limit-350lbs)

## 2023-07-31 NOTE — Telephone Encounter (Signed)
Spoke with patient and  forwarded to provider in another message.

## 2023-07-31 NOTE — Transitions of Care (Post Inpatient/ED Visit) (Signed)
   07/31/2023  Name: Jennifer [name] MRN: [id number] DOB: 1966-07-20  Today's TOC FU Call Status: Today's TOC FU Call Status:: Unsuccessful Call (3rd Attempt) Unsuccessful Call (1st Attempt) Date: 07/24/23 Unsuccessful Call (2nd Attempt) Date: 07/31/23 Unsuccessful Call (3rd Attempt) Date: 07/31/23  Attempted to reach the patient regarding the most recent Inpatient/ED visit.  Follow Up Plan: No further outreach attempts will be made at this time. We have been unable to contact the patient.  Signature Karena Addison, LPN [facility] Nurse Health Advisor Direct Dial [phone]

## 2023-07-31 NOTE — Transitions of Care (Post Inpatient/ED Visit) (Signed)
   07/31/2023  Name: Jennifer [name] MRN: [id number] DOB: [birth year]  Today's TOC FU Call Status: Today's TOC FU Call Status:: Unsuccessful Call (2nd Attempt) Unsuccessful Call (1st Attempt) Date: 07/24/23 Unsuccessful Call (2nd Attempt) Date: 07/31/23  Attempted to reach the patient regarding the most recent Inpatient/ED visit.  Follow Up Plan: Additional outreach attempts will be made to reach the patient to complete the Transitions of Care (Post Inpatient/ED visit) call.   Signature Karena Addison, LPN [facility] Nurse Health Advisor Direct Dial [phone]

## 2023-07-31 NOTE — Telephone Encounter (Signed)
Copied from CRM [id number]. Topic: Clinical - Red Word Triage >> Jul 30, 2023 11:15 AM Tiffany H wrote: Red Word that prompted transfer to Nurse Triage: Patient called to advise that she's still having knee pain. Transferred patient to [facility].

## 2023-08-01 DIAGNOSIS — R000 Tachycardia, unspecified: Secondary | ICD-10-CM | POA: Diagnosis not present

## 2023-08-01 NOTE — Telephone Encounter (Signed)
Attempted call to patient. Left a voice mail message requesting a return call.  

## 2023-08-02 NOTE — Telephone Encounter (Signed)
Copied from CRM [id number]. Topic: MyChart - Other >> Aug 02, 2023  9:59 AM Elle L wrote: Reason for CRM: The patient states that she missed a call from [place], LPN and is requesting a call back at [phone].

## 2023-08-02 NOTE — Telephone Encounter (Signed)
Attempted call to patient. Left a voice mail message requesting a return call.  

## 2023-08-03 ENCOUNTER — Telehealth (INDEPENDENT_AMBULATORY_CARE_PROVIDER_SITE_OTHER): Payer: Medicare HMO | Admitting: Family Medicine

## 2023-08-03 DIAGNOSIS — H9202 Otalgia, left ear: Secondary | ICD-10-CM

## 2023-08-03 DIAGNOSIS — I10 Essential (primary) hypertension: Secondary | ICD-10-CM | POA: Diagnosis not present

## 2023-08-03 DIAGNOSIS — G4733 Obstructive sleep apnea (adult) (pediatric): Secondary | ICD-10-CM | POA: Diagnosis not present

## 2023-08-03 NOTE — Assessment & Plan Note (Signed)
Uncontrolled. OK to start Prozasin. Never taken before. Continue with metoprolol.  Reports home blood pressures have been running in the 140s.

## 2023-08-03 NOTE — Progress Notes (Unsigned)
Pt stated that Dr. Linford Arnold was going to check into a medication the the cardiologist wanted her to start (prazosin)   She also said that she cannot go to [facility] to pick up the kit to have her sleep study done due to not having transportation.   She also would like to discuss some other issues

## 2023-08-03 NOTE — Progress Notes (Unsigned)
   Established Patient Office Visit  Subjective  Patient ID: Jennifer [name], female    DOB: 12-06-1965  Age: 57 y.o. MRN: [id number]  Chief Complaint  Patient presents with   Medical Management of Chronic Issues    HPI  Pt stated that Dr. Linford Arnold was going to check into a medication the the cardiologist wanted her to start (prazosin)   She also said that she cannot go to [facility] to pick up the kit to have her sleep study done due to not having transportation. She also will likely be changing insurance in Jan. She is worried I will no longer be I'm network for her.    She also would like to discuss some other issues   C/O left ear pain - says she was told no infectin but has some fluid. She is on nasal antihistamine and doesn't do well wht steroids.    {History (Optional):23778}  ROS    Objective:     LMP 06/25/2013  {Vitals History (Optional):23777}  Physical Exam   No results found for any visits on 08/03/23.  {Labs (Optional):23779}  The ASCVD Risk score (Arnett DK, et al., 2019) failed to calculate for the following reasons:   Risk score cannot be calculated because patient has a medical history suggesting prior/existing ASCVD    Assessment & Plan:   Problem List Items Addressed This Visit       Cardiovascular and Mediastinum   Hypertension with intolerance to multiple antihypertensive drugs - Primary   Uncontrolled. OK to start Prozasin. Never taken before. Continue with metoprolol       Other Visit Diagnoses       Left ear pain           No follow-ups on file.    Nani Gasser, MD

## 2023-08-03 NOTE — Telephone Encounter (Signed)
Patient has upcoming appt today with Dr. Linford Arnold.

## 2023-08-03 NOTE — Telephone Encounter (Signed)
Patient has appointment today with Dr. Linford Arnold.

## 2023-08-06 ENCOUNTER — Encounter: Payer: Self-pay | Admitting: Family Medicine

## 2023-08-06 DIAGNOSIS — I4719 Other supraventricular tachycardia: Secondary | ICD-10-CM | POA: Diagnosis not present

## 2023-08-06 DIAGNOSIS — E611 Iron deficiency: Secondary | ICD-10-CM | POA: Diagnosis not present

## 2023-08-06 DIAGNOSIS — Z79899 Other long term (current) drug therapy: Secondary | ICD-10-CM | POA: Diagnosis not present

## 2023-08-06 DIAGNOSIS — I10 Essential (primary) hypertension: Secondary | ICD-10-CM | POA: Diagnosis not present

## 2023-08-06 DIAGNOSIS — E782 Mixed hyperlipidemia: Secondary | ICD-10-CM | POA: Diagnosis not present

## 2023-08-06 DIAGNOSIS — I471 Supraventricular tachycardia, unspecified: Secondary | ICD-10-CM | POA: Diagnosis not present

## 2023-08-06 DIAGNOSIS — R002 Palpitations: Secondary | ICD-10-CM | POA: Diagnosis not present

## 2023-08-06 DIAGNOSIS — E531 Pyridoxine deficiency: Secondary | ICD-10-CM | POA: Diagnosis not present

## 2023-08-06 LAB — BASIC METABOLIC PANEL: EGFR: 68

## 2023-08-06 NOTE — Assessment & Plan Note (Signed)
Put sleep study on hold until after January 1 since her insurance will likely be changing.  We can do a home test either through [facility] or through the covered agency of her choice after January.

## 2023-08-08 DIAGNOSIS — Z5989 Other problems related to housing and economic circumstances: Secondary | ICD-10-CM | POA: Diagnosis not present

## 2023-08-08 DIAGNOSIS — Z79899 Other long term (current) drug therapy: Secondary | ICD-10-CM | POA: Diagnosis not present

## 2023-08-08 DIAGNOSIS — R000 Tachycardia, unspecified: Secondary | ICD-10-CM | POA: Diagnosis not present

## 2023-08-08 DIAGNOSIS — I10 Essential (primary) hypertension: Secondary | ICD-10-CM | POA: Diagnosis not present

## 2023-08-08 DIAGNOSIS — F439 Reaction to severe stress, unspecified: Secondary | ICD-10-CM | POA: Diagnosis not present

## 2023-08-08 DIAGNOSIS — R0789 Other chest pain: Secondary | ICD-10-CM | POA: Diagnosis not present

## 2023-08-08 DIAGNOSIS — Z87891 Personal history of nicotine dependence: Secondary | ICD-10-CM | POA: Diagnosis not present

## 2023-08-08 DIAGNOSIS — R079 Chest pain, unspecified: Secondary | ICD-10-CM | POA: Diagnosis not present

## 2023-08-09 ENCOUNTER — Ambulatory Visit: Payer: Self-pay | Admitting: Family Medicine

## 2023-08-09 ENCOUNTER — Telehealth: Payer: Self-pay

## 2023-08-09 DIAGNOSIS — R000 Tachycardia, unspecified: Secondary | ICD-10-CM | POA: Diagnosis not present

## 2023-08-09 DIAGNOSIS — I10 Essential (primary) hypertension: Secondary | ICD-10-CM | POA: Diagnosis not present

## 2023-08-09 DIAGNOSIS — R519 Headache, unspecified: Secondary | ICD-10-CM | POA: Diagnosis not present

## 2023-08-09 NOTE — Telephone Encounter (Signed)
Copied from CRM [id number]. Topic: Clinical - Red Word Triage >> Aug 09, 2023 11:28 AM Jennifer [name] wrote: Red Word that prompted transfer to Nurse Triage: BP is sky high (150) and went to ER last night about it. Requesting Jennifer [name] the nurse right now about medications.    Chief Complaint: HTN Symptoms: Headache Frequency: constant Pertinent Negatives: Patient denies blurred vision and chest pain at this time Disposition: [] ED /[] Urgent Care (no appt availability in office) / [] Appointment(In office/virtual)/ []  [place] [facility]/ [] Home Care/ [] Refused Recommended Disposition /[] [place] [facility]/ []  Follow-up with PCP Additional Notes: Pt crying and upset over the phone about having to speak with an answering service. This RN advised that she was speaking with a triage nurse that could possibly assist with getting her scheduled. Pt remained adamant abiout speaking Health visitor at [place]. RN placed call to [facility] at [facility], spoke with Jennifer [name] and was advised that Jennifer [name] was not in the office today. Jennifer [name] advised that Jennifer [name] would follow-up with patient directly.  RN made patient aware, pt continued to express her frustration with her ED visit stating that her DBP was "in the 150s" and nothing was done. Pt wanted to advise Dr. Glade [name] that she took a dose of Spirolactone 12.5mg  today along with her Metoprolo because her BP was still elevated. RN offered to schedule patient a visit to be seen today, pt decline stating that she only wants to deal with Dr. Glade [name], Jennifer [name], or Jennifer [name], and that she will just go back to the ED if her BP is still high. RN wil provide a courtesy follow-up call this after to check pts symptoms and see if she has rec'd a call back.       Reason for Disposition  Systolic BP  >= 180 OR Diastolic >= 110  Answer Assessment - Initial Assessment Questions 1. BLOOD PRESSURE: "What is the blood pressure?" "Did you take at least two measurements 5 minutes apart?"     "Almost  200 over 90-something"  2. ONSET: "When did you take your blood pressure?"     Took BP this morning  3. HOW: "How did you take your blood pressure?" (e.g., automatic home BP monitor, visiting nurse)     Automatic  4. HISTORY: "Do you have a history of high blood pressure?"     Yes  5. MEDICINES: "Are you taking any medicines for blood pressure?" "Have you missed any doses recently?"     Took spirolactone and metoprolol today.  6. OTHER SYMPTOMS: "Do you have any symptoms?" (e.g., blurred vision, chest pain, difficulty breathing, headache, weakness)     Headache and chest pain (yesterday)  Protocols used: Blood Pressure - High-A-AH

## 2023-08-09 NOTE — Telephone Encounter (Signed)
Pt reports that she went to [facility] and her bp is elevated and they will not treat her for this. She stated that she took Spirolactone to see if that would help to get her bp down and wanted to let Dr. Linford Arnold know that she took this and to see what she thought about her continuing to take this.   I told her that if she is not consistent with taking this medication that it is not going to give her the results that she is looking for. She will need to take this on a regular and ongoing basis not just for a week or two for her headache to go away and her bp to go down.  I told her that I would fwd this to Dr. Linford Arnold but I did let her know that she would not be back in the office until Monday. She voiced understanding.

## 2023-08-09 NOTE — Telephone Encounter (Signed)
Spoke with Jennifer [name] who states she canceled her appt. With Dr. Alvester Morin on-line because she hadn't heard back from transportation. Pt advised that transportation my call her 1-2 days prior but the office could reach back out to them. Pt states, "No, I want to cancel the appt. Because I'm not feeling good, I am in the ED right now with blood pressure issues".  Pt advised to call the office back when she is ready to reschedule her appointment.

## 2023-08-09 NOTE — Telephone Encounter (Signed)
Copied from CRM [id number]. Topic: Clinical - Red Word Triage >> Aug 09, 2023  2:26 PM Jennifer [name] wrote: Red Word that prompted transfer to Nurse Triage: high blood pressure with headache, facial numbness, is in the emergency room   See previous notes

## 2023-08-09 NOTE — Telephone Encounter (Signed)
Ms. Gilner left a voicemail on 12/24 requesting a call back regarding her upcoming appointment with Dr.Newton on 12/30.   LVM for patient to call office

## 2023-08-09 NOTE — Telephone Encounter (Signed)
The patient is calling back from the ED stating that her head is hurting and she still has not heard back from the doctor's office.  She said, "I guess someone will listen if I stroke out." Informed the caller that her message was routed to her pcp and we are awaiting further recommendations.  She continued to voice frustration.  Called [facility] and spoke to [place] who spoke to [place].  [name] was tied up with patients and stated that she will call the patient back.  The patient verbalized understanding.

## 2023-08-10 ENCOUNTER — Telehealth: Payer: Self-pay

## 2023-08-10 NOTE — Transitions of Care (Post Inpatient/ED Visit) (Unsigned)
   08/10/2023  Name: Jennifer [name] MRN: [id number] DOB: 1966/05/19  Today's TOC FU Call Status: Today's TOC FU Call Status:: Unsuccessful Call (1st Attempt) Unsuccessful Call (1st Attempt) Date: 08/10/23  Attempted to reach the patient regarding the most recent Inpatient/ED visit.  Follow Up Plan: Additional outreach attempts will be made to reach the patient to complete the Transitions of Care (Post Inpatient/ED visit) call.   Abby Nellene Courtois, CMA  [facility] AWV Team Direct Dial: [phone]

## 2023-08-12 DIAGNOSIS — I10 Essential (primary) hypertension: Secondary | ICD-10-CM | POA: Diagnosis not present

## 2023-08-12 DIAGNOSIS — Z79899 Other long term (current) drug therapy: Secondary | ICD-10-CM | POA: Diagnosis not present

## 2023-08-12 DIAGNOSIS — R079 Chest pain, unspecified: Secondary | ICD-10-CM | POA: Diagnosis not present

## 2023-08-12 DIAGNOSIS — I700 Atherosclerosis of aorta: Secondary | ICD-10-CM | POA: Diagnosis not present

## 2023-08-12 DIAGNOSIS — Z87891 Personal history of nicotine dependence: Secondary | ICD-10-CM | POA: Diagnosis not present

## 2023-08-12 DIAGNOSIS — R0789 Other chest pain: Secondary | ICD-10-CM | POA: Diagnosis not present

## 2023-08-12 DIAGNOSIS — R1011 Right upper quadrant pain: Secondary | ICD-10-CM | POA: Diagnosis not present

## 2023-08-12 NOTE — Telephone Encounter (Signed)
Would recommend take spironolactine daily in AM and metoprolol daily and  check BP daily, sitting up after 5 minutes with feet uncrossed.

## 2023-08-13 ENCOUNTER — Encounter: Payer: Self-pay | Admitting: Family Medicine

## 2023-08-13 ENCOUNTER — Inpatient Hospital Stay: Payer: Medicare HMO | Admitting: Psychiatry

## 2023-08-13 DIAGNOSIS — R079 Chest pain, unspecified: Secondary | ICD-10-CM | POA: Diagnosis not present

## 2023-08-13 DIAGNOSIS — R002 Palpitations: Secondary | ICD-10-CM | POA: Diagnosis not present

## 2023-08-13 DIAGNOSIS — R000 Tachycardia, unspecified: Secondary | ICD-10-CM | POA: Diagnosis not present

## 2023-08-13 DIAGNOSIS — R457 State of emotional shock and stress, unspecified: Secondary | ICD-10-CM | POA: Diagnosis not present

## 2023-08-13 DIAGNOSIS — I10 Essential (primary) hypertension: Secondary | ICD-10-CM | POA: Diagnosis not present

## 2023-08-13 DIAGNOSIS — R0789 Other chest pain: Secondary | ICD-10-CM | POA: Diagnosis not present

## 2023-08-14 DIAGNOSIS — R079 Chest pain, unspecified: Secondary | ICD-10-CM | POA: Diagnosis not present

## 2023-08-14 NOTE — Transitions of Care (Post Inpatient/ED Visit) (Signed)
 08/14/2023  Name: Jennifer [name] MRN: [id number] DOB: 11-07-1965  Today's TOC FU Call Status: Today's TOC FU Call Status:: Successful TOC FU Call Completed Unsuccessful Call (1st Attempt) Date: 08/10/23 [facility] FU Call Complete Date: 08/14/23 Patient's Name and Date of Birth confirmed.  Transition Care Management Follow-up Telephone Call Date of Discharge: 08/08/23 Discharge Facility: Other [facility]) Name of Other (Non[facility]) Discharge Facility: [facility] Type of Discharge: Emergency Department Reason for ED Visit: Cardiac Conditions Cardiac Conditions Diagnosis: Chest Pain Persisting How have you been since you were released from the hospital?: Same (pt states she isn't getting any better. Her diastolic bp is running high and she doesn't feel well.) Any questions or concerns?: No  Items Reviewed: Did you receive and understand the discharge instructions provided?: Yes Medications obtained,verified, and reconciled?: Yes (Medications Reviewed) Any new allergies since your discharge?: No Dietary orders reviewed?: Yes Type of Diet Ordered:: low sodium heart healthy Do you have support at home?: Yes People in Home: spouse  Medications Reviewed Today: Medications Reviewed Today     Reviewed by Flynt Breeze, Marshall LABOR, CMA (Certified Medical Assistant) on 08/14/23 at [phone]  Med List Status: <None>   Medication Order Taking? Sig Documenting Provider Last Dose Status Informant  AMBULATORY NON FORMULARY MEDICATION [id number] No Medication Name: standing order BMP x 1 year Alvan Dorothyann BIRCH, MD Taking Active   AMBULATORY JESSE SCHLOSSMAN MEDICATION [id number] No Medication Name: Standing order for CBC/diff (monthly) x 1 year Alvan Dorothyann BIRCH, MD Taking Active   azelastine  (ASTELIN ) 0.1 % nasal spray [id number] No Place 1 spray in each nostril twice a day as needed for runny nose/drainage down throat Cheryl Reusing, FNP Taking Active   Continuous Glucose Receiver (FREESTYLE  LIBRE 2 READER) [place] [id number] No 1 each by Does not apply route daily. Alvan Dorothyann BIRCH, MD Taking Active   Continuous Glucose Sensor (FREESTYLE LIBRE 2 SENSOR) [place] [id number] No 1 each by Does not apply route every 14 (fourteen) days. Alvan Dorothyann BIRCH, MD Taking Active   EPINEPHrine  (EPIPEN  2-PAK) 0.3 mg/0.3 mL IJ SOAJ injection [id number] No Inject 0.3 mg into the muscle as needed for anaphylaxis. Marinda Rocky SAILOR, MD Taking Active   famotidine  (PEPCID ) 40 MG tablet [id number] No Take 1 tablet (40 mg total) by mouth 2 (two) times daily. Alvan Dorothyann BIRCH, MD Taking Active   fluticasone  (FLONASE ) 50 MCG/ACT nasal spray [id number] No Place 1 spray into both nostrils daily. Alvan Dorothyann BIRCH, MD Taking Active   levalbuterol  (XOPENEX  HFA) 45 MCG/ACT inhaler [id number] No Inhale 1 puff into the lungs every 4 (four) hours as needed for wheezing or shortness of breath. Marinda Rocky SAILOR, MD Taking Active   levalbuterol  (XOPENEX ) 1.25 MG/3ML nebulizer solution [id number] No Take 1.25 mg by nebulization every 4 (four) hours as needed for wheezing. Marinda Rocky SAILOR, MD Taking Active   metoprolol  tartrate (LOPRESSOR ) 12.5 mg TABS tablet [id number] No Take 12.5 mg by mouth 2 (two) times daily. [provider] Taking Active   potassium chloride  (KLOR-CON ) 20 MEQ packet [id number] No Take 20 mEq by mouth daily. Alvan Dorothyann BIRCH, MD Taking Active   Med List Note Sheryl, Dorothyann BIRCH, MD 08/29/21 1622):               Home Care and Equipment/Supplies: Were Home Health Services Ordered?: NA Any new equipment or medical supplies ordered?: NA  Functional Questionnaire: Do you need assistance with bathing/showering or dressing?: No Do you need assistance with meal preparation?: No Do you  need assistance with eating?: No Do you have difficulty maintaining continence: No Do you need assistance with getting out of bed/getting out of a chair/moving?: No Do you have difficulty managing or  taking your medications?: No  Follow up appointments reviewed: PCP Follow-up appointment confirmed?: Yes Date of PCP follow-up appointment?: 08/17/23 Follow-up Provider: [facility] Follow-up appointment confirmed?: NA Do you need transportation to your follow-up appointment?: No Do you understand care options if your condition(s) worsen?: Yes-patient verbalized understanding    Sadao Weyer, CMA  [facility] AWV Team Direct Dial: [phone]

## 2023-08-15 DIAGNOSIS — Z20822 Contact with and (suspected) exposure to covid-19: Secondary | ICD-10-CM | POA: Diagnosis not present

## 2023-08-15 DIAGNOSIS — Z79899 Other long term (current) drug therapy: Secondary | ICD-10-CM | POA: Diagnosis not present

## 2023-08-15 DIAGNOSIS — Z87891 Personal history of nicotine dependence: Secondary | ICD-10-CM | POA: Diagnosis not present

## 2023-08-15 DIAGNOSIS — R0789 Other chest pain: Secondary | ICD-10-CM | POA: Diagnosis not present

## 2023-08-16 DIAGNOSIS — R000 Tachycardia, unspecified: Secondary | ICD-10-CM | POA: Diagnosis not present

## 2023-08-16 DIAGNOSIS — R0789 Other chest pain: Secondary | ICD-10-CM | POA: Diagnosis not present

## 2023-08-16 DIAGNOSIS — Z20822 Contact with and (suspected) exposure to covid-19: Secondary | ICD-10-CM | POA: Diagnosis not present

## 2023-08-16 DIAGNOSIS — R0602 Shortness of breath: Secondary | ICD-10-CM | POA: Diagnosis not present

## 2023-08-17 ENCOUNTER — Encounter: Payer: Self-pay | Admitting: Family Medicine

## 2023-08-17 ENCOUNTER — Ambulatory Visit (INDEPENDENT_AMBULATORY_CARE_PROVIDER_SITE_OTHER): Payer: Medicare HMO | Admitting: Family Medicine

## 2023-08-17 VITALS — BP 158/82 | HR 81 | Ht 62.0 in | Wt 230.0 lb

## 2023-08-17 DIAGNOSIS — I10 Essential (primary) hypertension: Secondary | ICD-10-CM | POA: Diagnosis not present

## 2023-08-17 DIAGNOSIS — G8929 Other chronic pain: Secondary | ICD-10-CM | POA: Diagnosis not present

## 2023-08-17 DIAGNOSIS — M25561 Pain in right knee: Secondary | ICD-10-CM

## 2023-08-17 DIAGNOSIS — F418 Other specified anxiety disorders: Secondary | ICD-10-CM

## 2023-08-17 DIAGNOSIS — H938X2 Other specified disorders of left ear: Secondary | ICD-10-CM | POA: Diagnosis not present

## 2023-08-17 DIAGNOSIS — R1012 Left upper quadrant pain: Secondary | ICD-10-CM

## 2023-08-17 MED ORDER — LISINOPRIL 5 MG PO TABS
5.0000 mg | ORAL_TABLET | Freq: Every day | ORAL | 1 refills | Status: DC
Start: 2023-08-17 — End: 2023-10-09

## 2023-08-17 NOTE — Assessment & Plan Note (Signed)
 She is gena discontinue the amlodipine  which she feels causes tachycardia and continue with the metoprolol .  She is willing to retry lisinopril  at a previously caused a cough, but I reminded her this is not technically an allergic reaction.  She is willing to try it again she also has a history of upper airway syndrome so that could have contributed at that time and may or may not have been the medication so she is open to it we will just start with 5 mg and if she tolerates that well after 1 to 2 weeks then we can increase the dose to better lower blood pressure.

## 2023-08-17 NOTE — Progress Notes (Signed)
 Established Patient Office Visit  Subjective  Patient ID: Jennifer [name], female    DOB: September 27, 1965  Age: 58 y.o. MRN: [id number]  Chief Complaint  Patient presents with   Hypertension    HPI  She went to the emergency department on December 29, December 30 and January 2 for atypical chest pain.  She was feeling lightheaded headache and some chest discomfort and so went home and checked her blood pressure DBP was in the 130s.  Took amlodipine  but feels tachy on it.  She is open to taking lisinopril  again. Says it caused a cough but did well with her BP and would like to try it again.   Still feels like left ear is uncomfortable and having little extra sinus pressure congestion and drainage.  She has not been able to get her saline rinse in because it has been swollen in her sinuses she did stop her Flonase  recently.  She is also still been having some left upper quadrant pain in her abdomen.  Feels like sometimes even just changing position from lying to sitting will cause her to feel tachycardic and because her blood pressure shoot up.  Does have an upcoming appointment with [place] [facility] she is hoping that they want to cancel it.    ROS    Objective:     BP (!) 158/82   Pulse 81   Ht 5' 2 (1.575 m)   Wt 230 lb (104.3 kg)   LMP 06/25/2013   SpO2 98%   BMI 42.07 kg/m    Physical Exam Constitutional:      Appearance: Normal appearance.  HENT:     Head: Normocephalic and atraumatic.     Right Ear: Tympanic membrane, ear canal and external ear normal. There is no impacted cerumen.     Left Ear: Tympanic membrane, ear canal and external ear normal. There is no impacted cerumen.     Nose: Nose normal.     Comments: Left nasal  turbinate is swollen.    Mouth/Throat:     Pharynx: Oropharynx is clear.  Eyes:     Conjunctiva/sclera: Conjunctivae normal.  Cardiovascular:     Rate and Rhythm: Normal rate and regular rhythm.  Pulmonary:     Effort: Pulmonary  effort is normal.     Breath sounds: Normal breath sounds.  Musculoskeletal:     Cervical back: Neck supple. No tenderness.  Lymphadenopathy:     Cervical: No cervical adenopathy.  Skin:    General: Skin is warm and dry.  Neurological:     Mental Status: She is alert and oriented to person, place, and time.  Psychiatric:        Mood and Affect: Mood normal.      No results found for any visits on 08/17/23.    The ASCVD Risk score (Arnett DK, et al., 2019) failed to calculate for the following reasons:   Risk score cannot be calculated because patient has a medical history suggesting prior/existing ASCVD    Assessment & Plan:   Problem List Items Addressed This Visit       Cardiovascular and Mediastinum   Hypertension with intolerance to multiple antihypertensive drugs - Primary   She is gena discontinue the amlodipine  which she feels causes tachycardia and continue with the metoprolol .  She is willing to retry lisinopril  at a previously caused a cough, but I reminded her this is not technically an allergic reaction.  She is willing to try it again she also has  a history of upper airway syndrome so that could have contributed at that time and may or may not have been the medication so she is open to it we will just start with 5 mg and if she tolerates that well after 1 to 2 weeks then we can increase the dose to better lower blood pressure.      Relevant Medications   lisinopril  (ZESTRIL ) 5 MG tablet     Other   Depression with anxiety   She recently has disconnected from her family members especially those that really increased her stress levels but even those that have not she just feels like that is best for her right now.  She is working towards getting a new therapist set up.      Other Visit Diagnoses       Ear pressure, left         Chronic pain of right knee         LUQ pain       Relevant Orders   Lipase       Ear pressure, left -TM looks normal.   Recommend restarting nasal steroid splays she does have some swollen turbinates today and then that way she can get the nasal saline to pass.  Left Upper quadrant pain-will check lipase recent liver enzymes were normal.  CBC was normal so unlikely to be related to her spleen.  She feels like her bowels are moving normally.  She does battle some chronic constipation but does not feel like that is what is causing this.  Right knee pain - insurance will not cover the MRI until she has had 6 weeks of formal PT.  Return in about 2 weeks (around 08/31/2023) for Hypertension.   I spent 40 minutes on the day of the encounter to include pre-visit record review, face-to-face time with the patient and post visit ordering of test.  Dorothyann Byars, MD

## 2023-08-17 NOTE — Telephone Encounter (Signed)
 Patient has appointment later today and will address then.

## 2023-08-17 NOTE — Assessment & Plan Note (Addendum)
 She recently has disconnected from her family members especially those that really increased her stress levels but even those that have not she just feels like that is best for her right now.  She is working towards getting a new therapist set up.

## 2023-08-17 NOTE — Patient Instructions (Addendum)
 Check out the extra information on the DASH diet as well and they do a good job with low-sodium there is some additional information about certain vegetables and potassium rich foods that are helpful as well.  I want to start with a low-dose lisinopril  just to make sure that you tolerate it well.  It may not have a big impact on lowering your blood pressure at the 5 mg dose but again I just want a make sure you are able to take it safely.  After a week and a half if you are doing well then we will bump up the dose to 10 mg and then see if it is starting to improve your blood pressure numbers.  Continue with the metoprolol  as well

## 2023-08-18 LAB — LIPASE: Lipase: 44 U/L (ref 14–72)

## 2023-08-19 DIAGNOSIS — Z8719 Personal history of other diseases of the digestive system: Secondary | ICD-10-CM | POA: Diagnosis not present

## 2023-08-19 DIAGNOSIS — K5730 Diverticulosis of large intestine without perforation or abscess without bleeding: Secondary | ICD-10-CM | POA: Diagnosis not present

## 2023-08-19 DIAGNOSIS — R1032 Left lower quadrant pain: Secondary | ICD-10-CM | POA: Diagnosis not present

## 2023-08-20 NOTE — Telephone Encounter (Signed)
 Pt was advised.

## 2023-08-20 NOTE — Progress Notes (Signed)
No sign of pancreatitis.

## 2023-08-21 DIAGNOSIS — M549 Dorsalgia, unspecified: Secondary | ICD-10-CM | POA: Diagnosis not present

## 2023-08-21 DIAGNOSIS — Z7689 Persons encountering health services in other specified circumstances: Secondary | ICD-10-CM | POA: Diagnosis not present

## 2023-08-21 DIAGNOSIS — Z889 Allergy status to unspecified drugs, medicaments and biological substances status: Secondary | ICD-10-CM | POA: Diagnosis not present

## 2023-08-21 DIAGNOSIS — C519 Malignant neoplasm of vulva, unspecified: Secondary | ICD-10-CM | POA: Diagnosis not present

## 2023-08-21 DIAGNOSIS — E876 Hypokalemia: Secondary | ICD-10-CM | POA: Diagnosis not present

## 2023-08-21 DIAGNOSIS — R1319 Other dysphagia: Secondary | ICD-10-CM | POA: Diagnosis not present

## 2023-08-21 DIAGNOSIS — E2609 Other primary hyperaldosteronism: Secondary | ICD-10-CM | POA: Diagnosis not present

## 2023-08-21 DIAGNOSIS — R1032 Left lower quadrant pain: Secondary | ICD-10-CM | POA: Diagnosis not present

## 2023-08-21 DIAGNOSIS — J4520 Mild intermittent asthma, uncomplicated: Secondary | ICD-10-CM | POA: Diagnosis not present

## 2023-08-22 ENCOUNTER — Other Ambulatory Visit: Payer: Self-pay | Admitting: *Deleted

## 2023-08-22 ENCOUNTER — Telehealth: Payer: Self-pay

## 2023-08-22 NOTE — Addendum Note (Signed)
 Addended by: Deno Etienne on: 08/22/2023 04:10 PM   Modules accepted: Orders

## 2023-08-22 NOTE — Telephone Encounter (Signed)
 Copied from CRM [id number]. Topic: Referral - Question >> Aug 16, 2023  9:44 AM Joesph PARAS wrote: Reason for CRM: Patient states referral for right knee was sent to [facility] but patient needs it sent to [facility] [address] Patient states it has been a month out from order and sending tot he wrong location is unacceptable.

## 2023-08-22 NOTE — Telephone Encounter (Signed)
 Location for MRI changed to [facility] [address]

## 2023-08-23 ENCOUNTER — Encounter: Payer: Self-pay | Admitting: Internal Medicine

## 2023-08-23 ENCOUNTER — Ambulatory Visit (INDEPENDENT_AMBULATORY_CARE_PROVIDER_SITE_OTHER): Payer: Medicare HMO | Admitting: Internal Medicine

## 2023-08-23 VITALS — BP 140/88 | HR 100 | Temp 97.3°F | Resp 18

## 2023-08-23 DIAGNOSIS — R058 Other specified cough: Secondary | ICD-10-CM | POA: Diagnosis not present

## 2023-08-23 DIAGNOSIS — E531 Pyridoxine deficiency: Secondary | ICD-10-CM | POA: Diagnosis not present

## 2023-08-23 DIAGNOSIS — K219 Gastro-esophageal reflux disease without esophagitis: Secondary | ICD-10-CM

## 2023-08-23 DIAGNOSIS — E782 Mixed hyperlipidemia: Secondary | ICD-10-CM | POA: Diagnosis not present

## 2023-08-23 DIAGNOSIS — I471 Supraventricular tachycardia, unspecified: Secondary | ICD-10-CM | POA: Diagnosis not present

## 2023-08-23 DIAGNOSIS — J3089 Other allergic rhinitis: Secondary | ICD-10-CM | POA: Diagnosis not present

## 2023-08-23 DIAGNOSIS — E611 Iron deficiency: Secondary | ICD-10-CM | POA: Diagnosis not present

## 2023-08-23 DIAGNOSIS — J4520 Mild intermittent asthma, uncomplicated: Secondary | ICD-10-CM

## 2023-08-23 DIAGNOSIS — Z20822 Contact with and (suspected) exposure to covid-19: Secondary | ICD-10-CM | POA: Diagnosis not present

## 2023-08-23 DIAGNOSIS — I4719 Other supraventricular tachycardia: Secondary | ICD-10-CM | POA: Diagnosis not present

## 2023-08-23 DIAGNOSIS — R002 Palpitations: Secondary | ICD-10-CM | POA: Diagnosis not present

## 2023-08-23 DIAGNOSIS — R1010 Upper abdominal pain, unspecified: Secondary | ICD-10-CM | POA: Diagnosis not present

## 2023-08-23 DIAGNOSIS — R0789 Other chest pain: Secondary | ICD-10-CM | POA: Diagnosis not present

## 2023-08-23 DIAGNOSIS — I10 Essential (primary) hypertension: Secondary | ICD-10-CM | POA: Diagnosis not present

## 2023-08-23 DIAGNOSIS — R1012 Left upper quadrant pain: Secondary | ICD-10-CM | POA: Diagnosis not present

## 2023-08-23 LAB — LAB REPORT - SCANNED: EGFR: 90

## 2023-08-23 MED ORDER — LEVALBUTEROL HCL 1.25 MG/3ML IN NEBU: 1.2500 mg | INHALATION_SOLUTION | RESPIRATORY_TRACT | 3 refills | Status: DC | PRN

## 2023-08-23 MED ORDER — FLUTICASONE PROPIONATE 50 MCG/ACT NA SUSP: 1.0000 | Freq: Every day | NASAL | 6 refills | Status: DC

## 2023-08-23 MED ORDER — LEVALBUTEROL TARTRATE 45 MCG/ACT IN AERO: 1.0000 | INHALATION_SPRAY | RESPIRATORY_TRACT | 1 refills | Status: DC | PRN

## 2023-08-23 MED ORDER — AZELASTINE HCL 0.1 % NA SOLN: NASAL | 5 refills | Status: DC

## 2023-08-23 NOTE — Patient Instructions (Addendum)
 Chronic cough-upper airway and asthma flare Irritants are a trigger. Also suspected cold air and humidity levels in home.   -consider using a humidifier. -Avoid irritants. -Continue Flonase  1 spray in each nostril daily -Continue Astelin  (azelastine ) nasal spray 1 spray in each nostril once to twice a day as needed for runny nose/drainage down throat -continue sinus rinses.  Asthma Your breathing test does not show obstruction but does show a lower FEV1 than last time. Suspect flare of asthma Chronic condition, patient reports occasional use of Xopenex  inhaler. -Continue Xopenex  every 6 hours as needed-2 puffs or 1 vial via nebulizer .will send refills.  Reflux: -continue management as per GI  Follow up : 6 months, sooner if needed It was a pleasure seeing you again in clinic today! Thank you for allowing me to participate in your care.  Rocky Endow, MD [facility]

## 2023-08-23 NOTE — Progress Notes (Signed)
 FOLLOW UP Date of Service/Encounter:  08/23/23  Subjective:  Jennifer [name] (DOB: [birth year]) is a 58 y.o. female who [facility] in re-evaluation of the following: adverse food reactions, asthma and chronic shortness of breath, chronic rhinitis and reflux  History obtained from: chart review and patient.  For Review, LV was on 06/21/23  with Wanda Craze, FNP seen for routine follow-up. See below for summary of history and diagnostics.   Therapeutic plans/changes recommended: Fev1 89%.  She was encouraged to use her Xopenex  when needed as well as azelastine  nasal spray in addition to her Flonase  as previously prescribed. ----------------------------------------------------- Pertinent History/Diagnostics:  Allergy  testing to select foods was obtained 03/19/23 and was negative to peanuts tree nuts, coconut, green pepper, cantaloupe, watermelon and garlic Labs were also obtained and were negative to green bell pepper, watermelon, cantaloupe, garlic, peanut and tree nuts. She is well-known to our clinic, seen most frequently for shortness of breath.  She has multiple medication intolerances and cannot tolerate ICS as well as most allergy  medications.  She has a complex medical history. uses Xopenex  infrequently.  She is followed by gastroenterology as well as ENT.  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  She has a history of reflux that has been difficult to manage. She is seen often at the emergency department for symptoms of difficulty breathing or concerns with her heart.  She has hypertension and has had issues with antihypertensive drugs.  She has also had issues with tachycardia and irregular heartbeats. --------------------------------------------------- Today presents for follow-up. Discussed the use of AI scribe software for clinical note transcription with the patient, who gave verbal consent to proceed.  History of Present Illness    The patient, with a history of asthma, presents with a chief complaint of a persistent, unidentified chemical smell in her home, which she believes is exacerbating her respiratory symptoms. The patient reports that her lungs react as if to a gas leak, although recent inspections have ruled this out. She describes a sensation of her lungs closing up and has been using Xopenex  sporadically to manage these symptoms.  The patient's living situation is in an apartment complex, and she reports that maintenance is currently replacing the carpeting in her unit. She expresses concern that the smell is worse when using the heating or air conditioning unit, which was recently replaced. The patient also mentions a history of gas leaks in the apartment and a persistent smell of cigarette smoke from a previous tenant.  She has been using Xopenex  and a sinus rinse to manage her symptoms, but reports episodes of severe coughing that sometimes lead to vomiting. These episodes have been occurring on and off for the past year, typically during periods of increased respiratory distress.  The patient also reports a heightened sense of smell since recovering from COVID-19, which she believes is contributing to her sensitivity to the unidentified smell in her apartment. She describes this as a chemical smell, distinct from the smell of gas leaks she has experienced in the past. This heightened sensitivity also extends to other environments, such as stores with strong perfume smells, which she now finds intolerable.  The patient is currently in the process of changing insurance providers and pharmacies, which may affect her access to medications. She expresses a need for refills of her nebulizer medication and inhalers, which may have been lost during the pharmacy transfer. She also mentions a potential change in her living situation if the  unidentified smell in her apartment cannot be resolved.       All medications  reviewed by clinical staff and updated in chart. No new pertinent medical or surgical history except as noted in HPI.  ROS: All others negative except as noted per HPI.   Objective:  BP (!) 140/88   Pulse 100   Temp (!) 97.3 F (36.3 C) (Temporal)   Resp 18   LMP 06/25/2013   SpO2 97%  There is no height or weight on file to calculate BMI. Physical Exam: General Appearance:  Alert, cooperative, no distress, appears stated age, intermittent hoarseness,   Head:  Normocephalic, without obvious abnormality, atraumatic  Eyes:  Conjunctiva clear, EOM's intact  Ears EACs normal bilaterally and normal TMs bilaterally  Nose: Nares normal, normal mucosa and no visible anterior polyps  Throat: Lips, tongue normal; teeth and gums normal, normal posterior oropharynx  Neck: Supple, symmetrical  Lungs:   clear to auscultation bilaterally, Respirations unlabored, intermittent dry coughing preceded by throat clearing  Heart:  regular rate and rhythm and no murmur, Appears well perfused  Extremities: No edema  Skin: Skin color, texture, turgor normal and no rashes or lesions on visualized portions of skin  Neurologic: No gross deficits   Labs:  Lab Orders  No laboratory test(s) ordered today    Spirometry:  Tracings reviewed. Her effort: Good reproducible efforts. FVC: 2.06L FEV1: 1.85L, 77% predicted FEV1/FVC ratio: 0.90 Interpretation: Spirometry consistent with possible restrictive disease.  Please see scanned spirometry results for details.  Assessment/Plan   Cough suspected largely in part to be upper airway cough, noted to be clearing throat on exam, intermittent hoarseness concerning for laryngospasm.  Do suspect an asthma component today based on spirometry findings.  Patient resistant/intolerant to many medications so we reviewed her medication plan and will refill her Xopenex  to be used as needed.  Do suspect she has an irritant component to her symptoms, but we discussed that the  treatment for this is avoidance.  Cold air/humidity may be causing an issue as well.  Discussed considering a humidifier for her home.  We also discussed the importance of controlling both drainage and reflux to modify upper airway irritation/inflammation.  Chronic cough-upper airway and asthma flare Irritants are a trigger. Also suspected cold air and humidity levels in home.   -consider using a humidifier. -Avoid irritants. -Continue Flonase  1 spray in each nostril daily -Continue Astelin  (azelastine ) nasal spray 1 spray in each nostril once to twice a day as needed for runny nose/drainage down throat -continue sinus rinses.  Asthma Your breathing test does not show obstruction but does show a lower FEV1 than last time. Suspect flare of asthma Chronic condition, patient reports occasional use of Xopenex  inhaler. -Continue Xopenex  every 6 hours as needed-2 puffs or 1 vial via nebulizer .will send refills.  Reflux: -continue management as per GI  Follow up : 6 months, sooner if needed It was a pleasure seeing you again in clinic today! Thank you for allowing me to participate in your care.  Other: none  Rocky Endow, MD  [facility] 

## 2023-08-24 DIAGNOSIS — R0602 Shortness of breath: Secondary | ICD-10-CM | POA: Diagnosis not present

## 2023-08-24 DIAGNOSIS — R000 Tachycardia, unspecified: Secondary | ICD-10-CM | POA: Diagnosis not present

## 2023-08-24 NOTE — Addendum Note (Signed)
 Addended by: Berna Bue on: 08/24/2023 07:58 AM   Modules accepted: Orders

## 2023-08-26 DIAGNOSIS — I10 Essential (primary) hypertension: Secondary | ICD-10-CM | POA: Diagnosis not present

## 2023-08-26 DIAGNOSIS — R002 Palpitations: Secondary | ICD-10-CM | POA: Diagnosis not present

## 2023-08-26 DIAGNOSIS — R079 Chest pain, unspecified: Secondary | ICD-10-CM | POA: Diagnosis not present

## 2023-08-26 DIAGNOSIS — R0789 Other chest pain: Secondary | ICD-10-CM | POA: Diagnosis not present

## 2023-08-27 ENCOUNTER — Ambulatory Visit: Payer: Medicare HMO | Admitting: Psychology

## 2023-08-27 DIAGNOSIS — R079 Chest pain, unspecified: Secondary | ICD-10-CM | POA: Diagnosis not present

## 2023-08-29 DIAGNOSIS — I10 Essential (primary) hypertension: Secondary | ICD-10-CM | POA: Diagnosis not present

## 2023-08-29 DIAGNOSIS — E611 Iron deficiency: Secondary | ICD-10-CM | POA: Diagnosis not present

## 2023-08-29 DIAGNOSIS — I152 Hypertension secondary to endocrine disorders: Secondary | ICD-10-CM | POA: Diagnosis not present

## 2023-08-29 DIAGNOSIS — E1159 Type 2 diabetes mellitus with other circulatory complications: Secondary | ICD-10-CM | POA: Diagnosis not present

## 2023-08-29 DIAGNOSIS — G4733 Obstructive sleep apnea (adult) (pediatric): Secondary | ICD-10-CM | POA: Diagnosis not present

## 2023-08-29 DIAGNOSIS — E782 Mixed hyperlipidemia: Secondary | ICD-10-CM | POA: Diagnosis not present

## 2023-08-29 DIAGNOSIS — Z79899 Other long term (current) drug therapy: Secondary | ICD-10-CM | POA: Diagnosis not present

## 2023-08-29 DIAGNOSIS — I4719 Other supraventricular tachycardia: Secondary | ICD-10-CM | POA: Diagnosis not present

## 2023-08-29 DIAGNOSIS — I471 Supraventricular tachycardia, unspecified: Secondary | ICD-10-CM | POA: Diagnosis not present

## 2023-08-29 DIAGNOSIS — R002 Palpitations: Secondary | ICD-10-CM | POA: Diagnosis not present

## 2023-08-29 DIAGNOSIS — R000 Tachycardia, unspecified: Secondary | ICD-10-CM | POA: Diagnosis not present

## 2023-08-29 DIAGNOSIS — E531 Pyridoxine deficiency: Secondary | ICD-10-CM | POA: Diagnosis not present

## 2023-08-29 LAB — BASIC METABOLIC PANEL: EGFR: 90

## 2023-08-31 ENCOUNTER — Telehealth (INDEPENDENT_AMBULATORY_CARE_PROVIDER_SITE_OTHER): Payer: Medicare HMO | Admitting: Family Medicine

## 2023-08-31 DIAGNOSIS — I10 Essential (primary) hypertension: Secondary | ICD-10-CM | POA: Diagnosis not present

## 2023-08-31 DIAGNOSIS — G4733 Obstructive sleep apnea (adult) (pediatric): Secondary | ICD-10-CM | POA: Diagnosis not present

## 2023-08-31 DIAGNOSIS — J3089 Other allergic rhinitis: Secondary | ICD-10-CM | POA: Diagnosis not present

## 2023-08-31 DIAGNOSIS — F418 Other specified anxiety disorders: Secondary | ICD-10-CM

## 2023-08-31 DIAGNOSIS — J4520 Mild intermittent asthma, uncomplicated: Secondary | ICD-10-CM

## 2023-08-31 DIAGNOSIS — E118 Type 2 diabetes mellitus with unspecified complications: Secondary | ICD-10-CM | POA: Diagnosis not present

## 2023-08-31 NOTE — Assessment & Plan Note (Signed)
Did follow-up with asthma and allergy and they noticed some slight change in her breathing.  She said that she feels like there may be some type of either chemical smell in her apartment that is affecting her breathing they did encourage her to use her albuterol as needed.

## 2023-08-31 NOTE — Progress Notes (Unsigned)
Virtual Visit via Video Note  I connected with Jennifer [name] on 09/03/23 at  2:00 PM EST by a video enabled telemedicine application and verified that I am speaking with the correct person using two identifiers.   I discussed the limitations of evaluation and management by telemedicine and the availability of in person appointments. The patient expressed understanding and agreed to proceed.  Patient location: at home Provider location: in office  Subjective:    CC:  No chief complaint on file.   HPI:  She has several things that she would like to discuss today including her blood pressure and recommendations from cardiology.  She did get back in with her therapist recently.  She ended up switching insurances and so is waiting for new card etc. to try to get in with a new behavioral therapist/counselor  Past medical history, Surgical history, Family history not pertinant except as noted below, Social history, Allergies, and medications have been entered into the medical record, reviewed, and corrections made.    Objective:    General: Speaking clearly in complete sentences without any shortness of breath.  Alert and oriented x3.  Normal judgment. No apparent acute distress.    Impression and Recommendations:    Problem List Items Addressed This Visit       Cardiovascular and Mediastinum   Hypertension with intolerance to multiple antihypertensive drugs - Primary   She would like her to retry losartan but she feels like that was one of the drugs that might of caused some lip swelling and would like to confirm that.  She has taken lisinopril in the past and it caused a cough but did not have any swelling with it.        Respiratory   Other allergic rhinitis   Did follow-up with asthma and allergy and they noticed some slight change in her breathing.  She said that she feels like there may be some type of either chemical smell in her apartment that is affecting her  breathing they did encourage her to use her albuterol as needed.      OSA (obstructive sleep apnea)   Previous studies have shown mild sleep apnea.  I believe she has had 3 with the last one being in 2015.  Cardiology is potentially interested in having her tested again.  I think it is important to get her blood pressure better controlled and then maybe focus on getting an up-to-date sleep study.      Mild intermittent asthma without complication   Really does feel like there is something in her apartment that is really flaring her breathing.        Endocrine   Diabetes mellitus type 2 with complications (HCC)   Managed with diet alone.  Not currently on medications.  Due for repeat A1c in March.  Lab Results  Component Value Date   HGBA1C 6.6 (A) 07/20/2023           Other   Depression with anxiety   She has been feeling more irritable and angry lately and has been working with her therapist on that when she gets her insurance settled she is still like to get established with a new therapist that she could meet with more regularly.       Knee pain-she is scheduled this weekend for MRI for further evaluation I did let her know that we are short staffed on radiologist so there may be a delay in reading the film.  No orders of the defined types were placed in this encounter.   No orders of the defined types were placed in this encounter.  I spent 40 minutes on the day of the encounter to include pre-visit record review, face-to-face time with the patient and post visit ordering of test.   I discussed the assessment and treatment plan with the patient. The patient was provided an opportunity to ask questions and all were answered. The patient agreed with the plan and demonstrated an understanding of the instructions.   The patient was advised to call back or seek an in-person evaluation if the symptoms worsen or if the condition fails to improve as anticipated.   I  spent 40 minutes on the day of the encounter to include pre-visit record review, face-to-face time with the patient and post visit ordering of test.   Nani Gasser, MD

## 2023-08-31 NOTE — Assessment & Plan Note (Signed)
She would like her to retry losartan but she feels like that was one of the drugs that might of caused some lip swelling and would like to confirm that.  She has taken lisinopril in the past and it caused a cough but did not have any swelling with it.

## 2023-08-31 NOTE — Assessment & Plan Note (Signed)
She has been feeling more irritable and angry lately and has been working with her therapist on that when she gets her insurance settled she is still like to get established with a new therapist that she could meet with more regularly.

## 2023-08-31 NOTE — Assessment & Plan Note (Signed)
Previous studies have shown mild sleep apnea.  I believe she has had 3 with the last one being in 2015.  Cardiology is potentially interested in having her tested again.  I think it is important to get her blood pressure better controlled and then maybe focus on getting an up-to-date sleep study.

## 2023-08-31 NOTE — Progress Notes (Unsigned)
Pt reported her last bp was in the 130's. She stated that the cardiologist wanted to put her back on the losartan she stated that it causes swelling she would like to discuss this with Dr. Linford Arnold.

## 2023-09-02 ENCOUNTER — Ambulatory Visit (HOSPITAL_BASED_OUTPATIENT_CLINIC_OR_DEPARTMENT_OTHER)
Admission: RE | Admit: 2023-09-02 | Discharge: 2023-09-02 | Disposition: A | Discharge status: Home / Self Care | Payer: Medicare HMO | Source: Ambulatory Visit | Attending: Family Medicine | Admitting: Family Medicine

## 2023-09-02 DIAGNOSIS — R0789 Other chest pain: Secondary | ICD-10-CM | POA: Diagnosis not present

## 2023-09-02 DIAGNOSIS — M25561 Pain in right knee: Secondary | ICD-10-CM | POA: Diagnosis not present

## 2023-09-02 DIAGNOSIS — Z20822 Contact with and (suspected) exposure to covid-19: Secondary | ICD-10-CM | POA: Diagnosis not present

## 2023-09-02 DIAGNOSIS — Z87891 Personal history of nicotine dependence: Secondary | ICD-10-CM | POA: Diagnosis not present

## 2023-09-02 DIAGNOSIS — I10 Essential (primary) hypertension: Secondary | ICD-10-CM | POA: Diagnosis not present

## 2023-09-03 NOTE — Assessment & Plan Note (Signed)
Really does feel like there is something in her apartment that is really flaring her breathing.

## 2023-09-03 NOTE — Assessment & Plan Note (Signed)
Managed with diet alone.  Not currently on medications.  Due for repeat A1c in March.  Lab Results  Component Value Date   HGBA1C 6.6 (A) 07/20/2023

## 2023-09-04 DIAGNOSIS — R9431 Abnormal electrocardiogram [ECG] [EKG]: Secondary | ICD-10-CM | POA: Diagnosis not present

## 2023-09-05 DIAGNOSIS — R000 Tachycardia, unspecified: Secondary | ICD-10-CM | POA: Diagnosis not present

## 2023-09-05 DIAGNOSIS — R0789 Other chest pain: Secondary | ICD-10-CM | POA: Diagnosis not present

## 2023-09-05 DIAGNOSIS — R0600 Dyspnea, unspecified: Secondary | ICD-10-CM | POA: Diagnosis not present

## 2023-09-05 DIAGNOSIS — Z20822 Contact with and (suspected) exposure to covid-19: Secondary | ICD-10-CM | POA: Diagnosis not present

## 2023-09-05 DIAGNOSIS — J4541 Moderate persistent asthma with (acute) exacerbation: Secondary | ICD-10-CM | POA: Diagnosis not present

## 2023-09-05 NOTE — Patient Instructions (Incomplete)
Chronic cough-upper airway and asthma flare Irritants are a trigger. Also suspected cold air and humidity levels in home.   -consider using a humidifier. -Avoid irritants. -Continue Flonase 1 spray in each nostril daily -Continue Astelin (azelastine) nasal spray 1 spray in each nostril once to twice a day as needed for runny nose/drainage down throat -continue sinus rinses. - recommend scheduling an appointment with ENT due to phantosmia  Asthma Your breathing test does not show obstruction but does show a lower FEV1 than last time. Suspect flare of asthma Chronic condition, patient reports occasional use of Xopenex inhaler. -Continue Xopenex every 6 hours as needed-2 puffs or 1 vial via nebulizer .will send refills. -Kriste Basque is willing to try another inhaler to see if we can get her breathing under better control.  She reports thrush and other symptoms with inhaled corticosteroids. -Start Qvar 40 mcg 1 puff once a day. Rinse mouth out after  Reflux: -continue management as per GI  Follow up : 4-6 weeks, sooner if needed

## 2023-09-06 ENCOUNTER — Telehealth: Payer: Self-pay

## 2023-09-06 ENCOUNTER — Ambulatory Visit: Payer: Medicare HMO | Admitting: Family

## 2023-09-06 ENCOUNTER — Encounter: Payer: Self-pay | Admitting: Family

## 2023-09-06 VITALS — BP 142/92 | HR 81 | Temp 98.4°F | Resp 20

## 2023-09-06 DIAGNOSIS — K219 Gastro-esophageal reflux disease without esophagitis: Secondary | ICD-10-CM

## 2023-09-06 DIAGNOSIS — R002 Palpitations: Secondary | ICD-10-CM | POA: Diagnosis not present

## 2023-09-06 DIAGNOSIS — R058 Other specified cough: Secondary | ICD-10-CM | POA: Diagnosis not present

## 2023-09-06 DIAGNOSIS — E782 Mixed hyperlipidemia: Secondary | ICD-10-CM | POA: Diagnosis not present

## 2023-09-06 DIAGNOSIS — Z79899 Other long term (current) drug therapy: Secondary | ICD-10-CM | POA: Diagnosis not present

## 2023-09-06 DIAGNOSIS — I4719 Other supraventricular tachycardia: Secondary | ICD-10-CM | POA: Diagnosis not present

## 2023-09-06 DIAGNOSIS — J4530 Mild persistent asthma, uncomplicated: Secondary | ICD-10-CM | POA: Diagnosis not present

## 2023-09-06 DIAGNOSIS — E531 Pyridoxine deficiency: Secondary | ICD-10-CM | POA: Diagnosis not present

## 2023-09-06 DIAGNOSIS — I471 Supraventricular tachycardia, unspecified: Secondary | ICD-10-CM | POA: Diagnosis not present

## 2023-09-06 DIAGNOSIS — E611 Iron deficiency: Secondary | ICD-10-CM | POA: Diagnosis not present

## 2023-09-06 DIAGNOSIS — I10 Essential (primary) hypertension: Secondary | ICD-10-CM | POA: Diagnosis not present

## 2023-09-06 MED ORDER — QVAR REDIHALER 40 MCG/ACT IN AERB: INHALATION_SPRAY | RESPIRATORY_TRACT | 2 refills | Status: DC

## 2023-09-06 NOTE — Telephone Encounter (Signed)
Pt called about instructions on how to use the Qvar inhaler and also pt mentioned that she can't afford to be paying $12 every time for an inhaler due to her fixed income.

## 2023-09-06 NOTE — Telephone Encounter (Signed)
Unfortunately we will not be able to find an inhaler less than $12.00. With your asthma being not well controlled it is important to find an inhaler to get better control of your symptoms. The standard of care for asthma is an inhaled corticosteroid. Without your willingness to follow our recommended treatment plan we cannot provide you any assistance with your symptoms for asthma.
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
## 2023-09-06 NOTE — Telephone Encounter (Signed)
Spoke with patient went over Chrissie's response note.

## 2023-09-07 DIAGNOSIS — H524 Presbyopia: Secondary | ICD-10-CM | POA: Diagnosis not present

## 2023-09-10 ENCOUNTER — Encounter: Payer: Self-pay | Admitting: Family Medicine

## 2023-09-10 DIAGNOSIS — R0789 Other chest pain: Secondary | ICD-10-CM | POA: Diagnosis not present

## 2023-09-10 DIAGNOSIS — I10 Essential (primary) hypertension: Secondary | ICD-10-CM | POA: Diagnosis not present

## 2023-09-10 DIAGNOSIS — R0602 Shortness of breath: Secondary | ICD-10-CM | POA: Diagnosis not present

## 2023-09-10 DIAGNOSIS — R079 Chest pain, unspecified: Secondary | ICD-10-CM | POA: Diagnosis not present

## 2023-09-10 DIAGNOSIS — Z20822 Contact with and (suspected) exposure to covid-19: Secondary | ICD-10-CM | POA: Diagnosis not present

## 2023-09-10 DIAGNOSIS — Z87891 Personal history of nicotine dependence: Secondary | ICD-10-CM | POA: Diagnosis not present

## 2023-09-10 NOTE — Progress Notes (Signed)
Hi Jennifer [name], MRI shows a small vertical tear on the edge of the medial meniscus and a little bit of tear of the cartilage along the lateral tibial plateau.  I would like to get you in with Ortho or sports med to discuss next steps.  Me know if you have a preference for where you would like to be seen.

## 2023-09-11 ENCOUNTER — Telehealth: Payer: Self-pay

## 2023-09-11 DIAGNOSIS — R0789 Other chest pain: Secondary | ICD-10-CM | POA: Diagnosis not present

## 2023-09-11 DIAGNOSIS — R059 Cough, unspecified: Secondary | ICD-10-CM | POA: Diagnosis not present

## 2023-09-11 DIAGNOSIS — R0602 Shortness of breath: Secondary | ICD-10-CM | POA: Diagnosis not present

## 2023-09-11 DIAGNOSIS — G8929 Other chronic pain: Secondary | ICD-10-CM

## 2023-09-11 DIAGNOSIS — Z20822 Contact with and (suspected) exposure to covid-19: Secondary | ICD-10-CM | POA: Diagnosis not present

## 2023-09-11 DIAGNOSIS — R519 Headache, unspecified: Secondary | ICD-10-CM | POA: Diagnosis not present

## 2023-09-11 NOTE — Telephone Encounter (Signed)
Ok to send referral

## 2023-09-11 NOTE — Telephone Encounter (Signed)
Copied from CRM [id number]. Topic: Referral - Request for Referral >> Sep 11, 2023  1:40 PM Nila Nephew wrote: Did the patient discuss referral with their provider in the last year? Yes  Appointment offered? No  Type of order/referral and detailed reason for visit: Would like to have a referral to orthopedics for knee  Preference of office, provider, location: Would like to be sent to [facility] orthopedics   If referral order, have you been seen by this specialty before? No (If Yes, this issue or another issue? When? Where?  Can we respond through MyChart? Yes

## 2023-09-11 NOTE — Telephone Encounter (Signed)
Copied from CRM [id number]. Topic: Clinical - Lab/Test Results >> Sep 10, 2023  2:05 PM Jennifer [name] wrote: Reason for CRM: Patient is requesting result for right knee imaging be expedited.

## 2023-09-11 NOTE — Telephone Encounter (Signed)
She just went to ED yesterday.

## 2023-09-11 NOTE — Telephone Encounter (Signed)
Seen by patient Jennifer [name] on 09/11/2023 10:33 AM

## 2023-09-11 NOTE — Telephone Encounter (Signed)
Copied from CRM [id number]. Topic: Clinical - Request for Lab/Test Order >> Sep 10, 2023  2:06 PM Nila Nephew wrote: Reason for CRM: Patient requesting a repeat chest x-ray and lab for D-dimer for ongoing shortness of breath.  Visits to providers are not helping and patient is requesting this message be reviewed by specifically Dr. Linford Arnold.  Patient requesting provider appease her and send in these to be done and reviewed prior to next appointment. Imaging request to be sent to [facility].

## 2023-09-12 NOTE — Telephone Encounter (Signed)
Yes, thank you.

## 2023-09-13 NOTE — Telephone Encounter (Signed)
Referral order placed.

## 2023-09-13 NOTE — Telephone Encounter (Signed)
Patient informed and will discuss with provider tomorrow

## 2023-09-14 ENCOUNTER — Ambulatory Visit (INDEPENDENT_AMBULATORY_CARE_PROVIDER_SITE_OTHER): Payer: Medicare HMO | Admitting: Family Medicine

## 2023-09-14 ENCOUNTER — Ambulatory Visit: Payer: Medicare HMO

## 2023-09-14 VITALS — BP 146/86 | HR 108 | Ht 62.0 in | Wt 231.0 lb

## 2023-09-14 DIAGNOSIS — Z6841 Body Mass Index (BMI) 40.0 and over, adult: Secondary | ICD-10-CM | POA: Diagnosis not present

## 2023-09-14 DIAGNOSIS — M25531 Pain in right wrist: Secondary | ICD-10-CM | POA: Diagnosis not present

## 2023-09-14 DIAGNOSIS — R519 Headache, unspecified: Secondary | ICD-10-CM | POA: Diagnosis not present

## 2023-09-14 DIAGNOSIS — E785 Hyperlipidemia, unspecified: Secondary | ICD-10-CM | POA: Diagnosis not present

## 2023-09-14 DIAGNOSIS — M79671 Pain in right foot: Secondary | ICD-10-CM

## 2023-09-14 DIAGNOSIS — E876 Hypokalemia: Secondary | ICD-10-CM | POA: Diagnosis not present

## 2023-09-14 DIAGNOSIS — J4520 Mild intermittent asthma, uncomplicated: Secondary | ICD-10-CM

## 2023-09-14 DIAGNOSIS — Z13220 Encounter for screening for lipoid disorders: Secondary | ICD-10-CM | POA: Diagnosis not present

## 2023-09-14 DIAGNOSIS — H04123 Dry eye syndrome of bilateral lacrimal glands: Secondary | ICD-10-CM

## 2023-09-14 DIAGNOSIS — E118 Type 2 diabetes mellitus with unspecified complications: Secondary | ICD-10-CM

## 2023-09-14 DIAGNOSIS — M79672 Pain in left foot: Secondary | ICD-10-CM | POA: Diagnosis not present

## 2023-09-14 NOTE — Assessment & Plan Note (Addendum)
She said she was recently started on Qvar for her breathing but felt like it was causing a little bit of tachycardia so stopped it.  We did discuss that Qvar is unlikely to cause tachycardia since it is not albuterol product.  I would encourage her to retry it.

## 2023-09-14 NOTE — Assessment & Plan Note (Signed)
She would like to get back in with podiatry so we will place a new referral today.

## 2023-09-14 NOTE — Assessment & Plan Note (Signed)
Is to get back on track and work on weight loss which would do a great deal to improve her glucose levels.

## 2023-09-14 NOTE — Assessment & Plan Note (Signed)
Currently on medication working on diet.  She did have an episode where she felt really sweaty and thought maybe her sugar was low so drank some juice and then had something to eat and then when she checked her sugar about 40 minutes later it was just around 140.  So she was not sure if her sugar was high or low or what exactly made her feel bad.

## 2023-09-14 NOTE — Assessment & Plan Note (Signed)
She has been prescribed Benay Spice

## 2023-09-14 NOTE — Progress Notes (Signed)
 Marland Kitchen
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
## 2023-09-15 LAB — CMP14+EGFR
ALT: 19 [IU]/L (ref 0–32)
AST: 17 [IU]/L (ref 0–40)
Albumin: 4.3 g/dL (ref 3.8–4.9)
Alkaline Phosphatase: 89 [IU]/L (ref 44–121)
BUN/Creatinine Ratio: 18 (ref 9–23)
BUN: 16 mg/dL (ref 6–24)
Bilirubin Total: 0.3 mg/dL (ref 0.0–1.2)
CO2: 21 mmol/L (ref 20–29)
Calcium: 9 mg/dL (ref 8.7–10.2)
Chloride: 105 mmol/L (ref 96–106)
Creatinine, Ser: 0.9 mg/dL (ref 0.57–1.00)
Globulin, Total: 2.4 g/dL (ref 1.5–4.5)
Glucose: 104 mg/dL — ABNORMAL HIGH (ref 70–99)
Potassium: 4.2 mmol/L (ref 3.5–5.2)
Sodium: 141 mmol/L (ref 134–144)
Total Protein: 6.7 g/dL (ref 6.0–8.5)
eGFR: 74 mL/min/{1.73_m2} (ref >59)

## 2023-09-15 LAB — LIPID PANEL
Chol/HDL Ratio: 5.2 {ratio} — ABNORMAL HIGH (ref 0.0–4.4)
Cholesterol, Total: 203 mg/dL — ABNORMAL HIGH (ref 100–199)
HDL: 39 mg/dL — ABNORMAL LOW (ref >39)
LDL Chol Calc (NIH): 133 mg/dL — ABNORMAL HIGH (ref 0–99)
Triglycerides: 173 mg/dL — ABNORMAL HIGH (ref 0–149)
VLDL Cholesterol Cal: 31 mg/dL (ref 5–40)

## 2023-09-17 ENCOUNTER — Encounter: Payer: Self-pay | Admitting: Family Medicine

## 2023-09-17 DIAGNOSIS — R002 Palpitations: Secondary | ICD-10-CM | POA: Diagnosis not present

## 2023-09-17 DIAGNOSIS — R9431 Abnormal electrocardiogram [ECG] [EKG]: Secondary | ICD-10-CM | POA: Diagnosis not present

## 2023-09-17 DIAGNOSIS — M461 Sacroiliitis, not elsewhere classified: Secondary | ICD-10-CM | POA: Diagnosis not present

## 2023-09-17 DIAGNOSIS — R079 Chest pain, unspecified: Secondary | ICD-10-CM | POA: Diagnosis not present

## 2023-09-17 DIAGNOSIS — M419 Scoliosis, unspecified: Secondary | ICD-10-CM | POA: Diagnosis not present

## 2023-09-17 DIAGNOSIS — M549 Dorsalgia, unspecified: Secondary | ICD-10-CM | POA: Diagnosis not present

## 2023-09-17 DIAGNOSIS — R000 Tachycardia, unspecified: Secondary | ICD-10-CM | POA: Diagnosis not present

## 2023-09-17 DIAGNOSIS — M545 Low back pain, unspecified: Secondary | ICD-10-CM | POA: Diagnosis not present

## 2023-09-17 DIAGNOSIS — R519 Headache, unspecified: Secondary | ICD-10-CM | POA: Diagnosis not present

## 2023-09-17 DIAGNOSIS — M47816 Spondylosis without myelopathy or radiculopathy, lumbar region: Secondary | ICD-10-CM | POA: Diagnosis not present

## 2023-09-17 DIAGNOSIS — G4489 Other headache syndrome: Secondary | ICD-10-CM | POA: Diagnosis not present

## 2023-09-17 NOTE — Progress Notes (Signed)
Hi Jennifer [name], triglycerides and LDL are mildly elevated.  Just encouraged her to continue to work on healthy diet and staying active.  Metabolic panel looks good.  He is try to get your mammogram scheduled when you can the last one was 2-1/2 years ago.  So you had mentioned that you had an eye exam done.  If you could let me know where you had that done would like to get a copy to add to your chart.

## 2023-09-19 ENCOUNTER — Telehealth: Payer: Self-pay

## 2023-09-19 NOTE — Transitions of Care (Post Inpatient/ED Visit) (Signed)
 09/19/2023  Name: Jennifer [name] MRN: [id number] DOB: March 14, 1966  Today's TOC FU Call Status: Today's TOC FU Call Status:: Successful TOC FU Call Completed Unsuccessful Call (1st Attempt) Date: 09/19/23 [facility] FU Call Complete Date: 09/19/23 Patient's Name and Date of Birth confirmed.  Transition Care Management Follow-up Telephone Call Date of Discharge: 09/17/23 Discharge Facility: Other M[facility]) Name of Other ([facility]) Discharge Facility: [facility] Type of Discharge: Emergency Department Reason for ED Visit: Other: (acute LBP, HA's) How have you been since you were released from the hospital?: Same Any questions or concerns?: No  Items Reviewed: Did you receive and understand the discharge instructions provided?: Yes Medications obtained,verified, and reconciled?: Yes (Medications Reviewed) Any new allergies since your discharge?: No Dietary orders reviewed?: NA Do you have support at home?: Yes  Medications Reviewed Today: Medications Reviewed Today     Reviewed by Aviyanna Colbaugh, Marshall LABOR, CMA (Certified Medical Assistant) on 09/19/23 at 1119  Med List Status: <None>   Medication Order Taking? Sig Documenting Provider Last Dose Status Informant  AMBULATORY NON FORMULARY MEDICATION [id number] No Medication Name: standing order BMP x 1 year Alvan Dorothyann BIRCH, MD Taking Active   AMBULATORY JESSE SCHLOSSMAN MEDICATION [id number] No Medication Name: Standing order for CBC/diff (monthly) x 1 year Alvan Dorothyann BIRCH, MD Taking Active   azelastine  (ASTELIN ) 0.1 % nasal spray [id number] No Place 1 spray in each nostril twice a day as needed for runny nose/drainage down throat Marinda Rocky SAILOR, MD Taking Active   beclomethasone (QVAR  REDIHALER) 40 MCG/ACT inhaler [id number] No Inhale 1 puff once a day. Rinse mouth out after to prevent thrush Cheryl Reusing, FNP Taking Active   Continuous Glucose Receiver (FREESTYLE LIBRE 2 READER) DEVI [id number] No 1 each by Does not apply route  daily. Alvan Dorothyann BIRCH, MD Taking Active   Continuous Glucose Sensor (FREESTYLE LIBRE 2 SENSOR) [place] [id number] No 1 each by Does not apply route every 14 (fourteen) days. Alvan Dorothyann BIRCH, MD Taking Active   EPINEPHrine  (EPIPEN  2-PAK) 0.3 mg/0.3 mL IJ SOAJ injection [id number] No Inject 0.3 mg into the muscle as needed for anaphylaxis. Marinda Rocky SAILOR, MD Taking Active   famotidine  (PEPCID ) 40 MG tablet [id number] No Take 1 tablet (40 mg total) by mouth 2 (two) times daily. Alvan Dorothyann BIRCH, MD Taking Active   fluticasone  (FLONASE ) 50 MCG/ACT nasal spray [id number] No Place 1 spray into both nostrils daily. Marinda Rocky SAILOR, MD Taking Active   levalbuterol  (XOPENEX  HFA) 45 MCG/ACT inhaler [id number] No Inhale 1 puff into the lungs every 4 (four) hours as needed for wheezing or shortness of breath. Marinda Rocky SAILOR, MD Taking Active   levalbuterol  (XOPENEX ) 1.25 MG/3ML nebulizer solution [id number] No Take 1.25 mg by nebulization every 4 (four) hours as needed for wheezing. Marinda Rocky SAILOR, MD Taking Active   lisinopril  (ZESTRIL ) 5 MG tablet [id number]  Take 1 tablet (5 mg total) by mouth daily. Alvan Dorothyann BIRCH, MD  Active   metoprolol  tartrate (LOPRESSOR ) 12.5 mg TABS tablet [id number] No Take 12.5 mg by mouth 2 (two) times daily. [provider] Taking Active   potassium chloride  (KLOR-CON ) 20 MEQ packet [id number] No Take 20 mEq by mouth daily. Alvan Dorothyann BIRCH, MD Taking Active   Med List Note Sheryl, Dorothyann BIRCH, MD 08/29/21 1622):               Home Care and Equipment/Supplies: Were Home Health Services Ordered?: NA Any new equipment or medical supplies ordered?: NA  Functional Questionnaire: Do  you need assistance with bathing/showering or dressing?: No Do you need assistance with meal preparation?: No Do you need assistance with eating?: No Do you have difficulty maintaining continence: No Do you need assistance with getting out of bed/getting out of a  chair/moving?: No Do you have difficulty managing or taking your medications?: No  Follow up appointments reviewed: PCP Follow-up appointment confirmed?: No MD Provider Line Number:[phone] Given: Yes Specialist Hospital Follow-up appointment confirmed?: Yes Date of Specialist follow-up appointment?: 09/20/23 Follow-Up Specialty Provider:: [facility] Do you need transportation to your follow-up appointment?: No Do you understand care options if your condition(s) worsen?: Yes-patient verbalized understanding    Rey Fors, CMA  [facility] AWV Team Direct Dial: [phone]

## 2023-09-19 NOTE — Transitions of Care (Post Inpatient/ED Visit) (Signed)
   09/19/2023  Name: Jennifer [name] MRN: [id number] DOB: 1965-08-16  Today's TOC FU Call Status: Today's TOC FU Call Status:: Unsuccessful Call (1st Attempt) Unsuccessful Call (1st Attempt) Date: 09/19/23  Attempted to reach the patient regarding the most recent Inpatient/ED visit.  Follow Up Plan: Additional outreach attempts will be made to reach the patient to complete the Transitions of Care (Post Inpatient/ED visit) call.   Zalen Sequeira, CMA  [facility] AWV Team Direct Dial: [phone]

## 2023-09-20 DIAGNOSIS — G4486 Cervicogenic headache: Secondary | ICD-10-CM | POA: Diagnosis not present

## 2023-09-21 ENCOUNTER — Ambulatory Visit: Payer: Self-pay | Admitting: Family Medicine

## 2023-09-21 ENCOUNTER — Encounter: Payer: Self-pay | Admitting: Family Medicine

## 2023-09-21 ENCOUNTER — Telehealth: Payer: Self-pay

## 2023-09-21 NOTE — Telephone Encounter (Signed)
  Chief Complaint: R wrist pain Symptoms: pain, immobility, lose of grip Frequency: weeks Pertinent Negatives: Patient denies fever, previous injury prior to this injury  Disposition: [] ED /[] Urgent Care (no appt availability in office) / [] Appointment(In office/virtual)/ []  [place] [facility]/ [] Home Care/ [] Refused Recommended Disposition /[] [facility]/ [x]  Follow-up with PCP  Additional Notes: Pt states that she hit her wrist initially a few weeks ago and has been struggling with pain since. Pt was seen by PCP and xr were ordered, they have not resulted yet. Denies carpal tunnel hx and denies arthritis hx. Pt states that she has occasionally had issues with grasping items. Pt states that she feels like it is getting worse. Pt states that certain areas are very tender. Denies swelling, denies redness. Offered pt appt this afternoon, pt refusing at this time d/t transportation issues. Pt advised that she could utilize UC, pt refused stating that she would go to ED as the UC just send the pts to the ED. Pt has had imaging done on this wrist, no results at this time.    Copied from CRM [id number]. Topic: Clinical - Red Word Triage >> Sep 21, 2023  8:29 AM Nestora J wrote: Red Word that prompted transfer to Nurse Triage: Severe pain in right wrist Reason for Disposition  Weakness (i.e., loss of strength) of new-onset in hand or fingers  (Exceptions: not truly weak, hand feels weak because of pain; weakness present > 2 weeks)  Answer Assessment - Initial Assessment Questions 1. ONSET: When did the pain start?     Few weeks ago 2. LOCATION: Where is the pain located?     R wrist 3. PAIN: How bad is the pain? (Scale 1-10; or mild, moderate, severe)   - MILD (1-3): doesn't interfere with normal activities   - MODERATE (4-7): interferes with normal activities (e.g., work or school) or awakens from sleep   - SEVERE (8-10): excruciating pain, unable to use hand at all     7 4.  WORK OR EXERCISE: Has there been any recent work or exercise that involved this part (i.e., hand or wrist) of the body?     Injured/hit her wrist 5. CAUSE: What do you think is causing the pain?     I injured the writs a few weeks ago, has never gone away 6. AGGRAVATING FACTORS: What makes the pain worse? (e.g., using computer)     Using the wrist, cleaning, etc 7. OTHER SYMPTOMS: Do you have any other symptoms? (e.g., neck pain, swelling, rash, numbness, fever)     denies  Protocols used: Hand and Wrist Pain-A-AH

## 2023-09-21 NOTE — Telephone Encounter (Signed)
 Copied from CRM [id number]. Topic: Clinical - Lab/Test Results >> Sep 21, 2023  8:28 AM Nestora J wrote: Reason for CRM: Pt called in stating she had an X ray on 01/31 and the results have not been released yet. She wants to know the status of the xray she says it's been 7 days and her wrist is still hurting but she doesn't know why. She is requesting a call back regarding this  Callback # [phone]

## 2023-09-21 NOTE — Telephone Encounter (Signed)
 Chart shows that x-ray has resulted but not yet reviewed by provider.

## 2023-09-23 DIAGNOSIS — R002 Palpitations: Secondary | ICD-10-CM | POA: Diagnosis not present

## 2023-09-23 DIAGNOSIS — R000 Tachycardia, unspecified: Secondary | ICD-10-CM | POA: Diagnosis not present

## 2023-09-23 DIAGNOSIS — M25561 Pain in right knee: Secondary | ICD-10-CM | POA: Diagnosis not present

## 2023-09-24 ENCOUNTER — Telehealth: Payer: Self-pay

## 2023-09-24 DIAGNOSIS — R519 Headache, unspecified: Secondary | ICD-10-CM | POA: Diagnosis not present

## 2023-09-24 NOTE — Transitions of Care (Post Inpatient/ED Visit) (Signed)
 09/24/2023  Name: Jennifer [name] MRN: [id number] DOB: [birth year]  Today's TOC FU Call Status: Today's TOC FU Call Status:: Successful TOC FU Call Completed TOC FU Call Complete Date: 09/24/23 Patient's Name and Date of Birth confirmed.  Transition Care Management Follow-up Telephone Call Date of Discharge: 09/23/23 Discharge Facility: Other ([facility]) Name of Other ([facility]) Discharge Facility: [facility] Type of Discharge: Emergency Department Reason for ED Visit:  (right knee pain,) How have you been since you were released from the hospital?: Same Any questions or concerns?: No  Items Reviewed: Did you receive and understand the discharge instructions provided?: Yes Medications obtained,verified, and reconciled?: Yes (Medications Reviewed) Any new allergies since your discharge?: No Dietary orders reviewed?: Yes Do you have support at home?: Yes People in Home: spouse  Medications Reviewed Today: Medications Reviewed Today     Reviewed by Darrall Ellison, LPN (Licensed Practical Nurse) on 09/24/23 at 1507  Med List Status: <None>   Medication Order Taking? Sig Documenting Provider Last Dose Status Informant  AMBULATORY NON FORMULARY MEDICATION [id number] No Medication Name: standing order BMP x 1 year Cydney Draft, MD Taking Active   AMBULATORY Kennedy Peabody MEDICATION [id number] No Medication Name: Standing order for CBC/diff (monthly) x 1 year Cydney Draft, MD Taking Active   azelastine  (ASTELIN ) 0.1 % nasal spray [id number] No Place 1 spray in each nostril twice a day as needed for runny nose/drainage down throat Sean Czar, MD Taking Active   beclomethasone (QVAR  REDIHALER) 40 MCG/ACT inhaler [id number] No Inhale 1 puff once a day. Rinse mouth out after to prevent thrush Tinnie Forehand, FNP Taking Active   Continuous Glucose Receiver (FREESTYLE LIBRE 2 READER) DEVI [id number] No 1 each by Does not apply route daily. Cydney Draft, MD Taking  Active   Continuous Glucose Sensor (FREESTYLE LIBRE 2 SENSOR) [place] [id number] No 1 each by Does not apply route every 14 (fourteen) days. Cydney Draft, MD Taking Active   EPINEPHrine  (EPIPEN  2-PAK) 0.3 mg/0.3 mL IJ SOAJ injection [id number] No Inject 0.3 mg into the muscle as needed for anaphylaxis. Sean Czar, MD Taking Active   famotidine  (PEPCID ) 40 MG tablet [id number] No Take 1 tablet (40 mg total) by mouth 2 (two) times daily. Cydney Draft, MD Taking Active   fluticasone  (FLONASE ) 50 MCG/ACT nasal spray [id number] No Place 1 spray into both nostrils daily. Sean Czar, MD Taking Active   levalbuterol  (XOPENEX  HFA) 45 MCG/ACT inhaler [id number] No Inhale 1 puff into the lungs every 4 (four) hours as needed for wheezing or shortness of breath. Sean Czar, MD Taking Active   levalbuterol  (XOPENEX ) 1.25 MG/3ML nebulizer solution [id number] No Take 1.25 mg by nebulization every 4 (four) hours as needed for wheezing. Sean Czar, MD Taking Active   lisinopril  (ZESTRIL ) 5 MG tablet [id number]  Take 1 tablet (5 mg total) by mouth daily. Cydney Draft, MD  Active   metoprolol  tartrate (LOPRESSOR ) 12.5 mg TABS tablet [id number] No Take 12.5 mg by mouth 2 (two) times daily. [provider] Taking Active   potassium chloride  (KLOR-CON ) 20 MEQ packet [id number] No Take 20 mEq by mouth daily. Cydney Draft, MD Taking Active   Med List Note Greer Leak, Corita Diego, MD 08/29/21 1622):               Home Care and Equipment/Supplies: Were Home Health Services Ordered?: NA Any new equipment or medical supplies ordered?: NA  Functional Questionnaire: Do you need assistance with  bathing/showering or dressing?: No Do you need assistance with meal preparation?: No Do you need assistance with eating?: No Do you have difficulty maintaining continence: No Do you need assistance with getting out of bed/getting out of a chair/moving?: No Do you have difficulty  managing or taking your medications?: No  Follow up appointments reviewed: PCP Follow-up appointment confirmed?: Yes Date of PCP follow-up appointment?: 09/28/23 Follow-up Provider: [facility] Follow-up appointment confirmed?: Yes Date of Specialist follow-up appointment?: 10/03/23 Follow-Up Specialty Provider:: cardio Do you need transportation to your follow-up appointment?: No Do you understand care options if your condition(s) worsen?: Yes-patient verbalized understanding    SIGNATURE Darrall Ellison, LPN [facility] Nurse Health Advisor Direct Dial [phone]

## 2023-09-25 DIAGNOSIS — M47811 Spondylosis without myelopathy or radiculopathy, occipito-atlanto-axial region: Secondary | ICD-10-CM | POA: Diagnosis not present

## 2023-09-25 DIAGNOSIS — M5031 Other cervical disc degeneration,  high cervical region: Secondary | ICD-10-CM | POA: Diagnosis not present

## 2023-09-25 DIAGNOSIS — R079 Chest pain, unspecified: Secondary | ICD-10-CM | POA: Diagnosis not present

## 2023-09-25 DIAGNOSIS — M5030 Other cervical disc degeneration, unspecified cervical region: Secondary | ICD-10-CM | POA: Diagnosis not present

## 2023-09-25 DIAGNOSIS — I6782 Cerebral ischemia: Secondary | ICD-10-CM | POA: Diagnosis not present

## 2023-09-25 DIAGNOSIS — R519 Headache, unspecified: Secondary | ICD-10-CM | POA: Diagnosis not present

## 2023-09-25 DIAGNOSIS — I10 Essential (primary) hypertension: Secondary | ICD-10-CM | POA: Diagnosis not present

## 2023-09-27 DIAGNOSIS — S83241A Other tear of medial meniscus, current injury, right knee, initial encounter: Secondary | ICD-10-CM | POA: Diagnosis not present

## 2023-09-27 DIAGNOSIS — Z8669 Personal history of other diseases of the nervous system and sense organs: Secondary | ICD-10-CM | POA: Diagnosis not present

## 2023-09-27 DIAGNOSIS — M542 Cervicalgia: Secondary | ICD-10-CM | POA: Diagnosis not present

## 2023-09-28 ENCOUNTER — Telehealth: Payer: PPO | Admitting: Family Medicine

## 2023-09-28 DIAGNOSIS — Z79899 Other long term (current) drug therapy: Secondary | ICD-10-CM | POA: Diagnosis not present

## 2023-09-28 DIAGNOSIS — Z5941 Food insecurity: Secondary | ICD-10-CM | POA: Diagnosis not present

## 2023-09-28 DIAGNOSIS — I10 Essential (primary) hypertension: Secondary | ICD-10-CM | POA: Diagnosis not present

## 2023-09-28 DIAGNOSIS — Z90710 Acquired absence of both cervix and uterus: Secondary | ICD-10-CM | POA: Diagnosis not present

## 2023-09-28 DIAGNOSIS — Z87891 Personal history of nicotine dependence: Secondary | ICD-10-CM | POA: Diagnosis not present

## 2023-09-28 DIAGNOSIS — R519 Headache, unspecified: Secondary | ICD-10-CM | POA: Diagnosis not present

## 2023-09-28 DIAGNOSIS — Z5982 Transportation insecurity: Secondary | ICD-10-CM | POA: Diagnosis not present

## 2023-09-28 DIAGNOSIS — J45909 Unspecified asthma, uncomplicated: Secondary | ICD-10-CM | POA: Diagnosis not present

## 2023-09-28 DIAGNOSIS — R0602 Shortness of breath: Secondary | ICD-10-CM | POA: Diagnosis not present

## 2023-09-28 DIAGNOSIS — Z7951 Long term (current) use of inhaled steroids: Secondary | ICD-10-CM | POA: Diagnosis not present

## 2023-09-28 DIAGNOSIS — R0789 Other chest pain: Secondary | ICD-10-CM | POA: Diagnosis not present

## 2023-09-28 DIAGNOSIS — R079 Chest pain, unspecified: Secondary | ICD-10-CM | POA: Diagnosis not present

## 2023-09-28 NOTE — Progress Notes (Signed)
Needs to see ortho. She has an ortho group she sees in [place].  If needs an official referral then we can place referra.

## 2023-10-02 ENCOUNTER — Telehealth (INDEPENDENT_AMBULATORY_CARE_PROVIDER_SITE_OTHER): Payer: PPO | Admitting: Family Medicine

## 2023-10-02 DIAGNOSIS — M25561 Pain in right knee: Secondary | ICD-10-CM | POA: Diagnosis not present

## 2023-10-02 DIAGNOSIS — R079 Chest pain, unspecified: Secondary | ICD-10-CM | POA: Diagnosis not present

## 2023-10-02 DIAGNOSIS — R0602 Shortness of breath: Secondary | ICD-10-CM | POA: Diagnosis not present

## 2023-10-02 DIAGNOSIS — I10 Essential (primary) hypertension: Secondary | ICD-10-CM

## 2023-10-02 DIAGNOSIS — G8929 Other chronic pain: Secondary | ICD-10-CM | POA: Diagnosis not present

## 2023-10-02 NOTE — Progress Notes (Unsigned)
Pt reports that she is in the ED

## 2023-10-04 ENCOUNTER — Encounter: Payer: Self-pay | Admitting: Family Medicine

## 2023-10-04 ENCOUNTER — Telehealth: Payer: Self-pay

## 2023-10-04 DIAGNOSIS — R002 Palpitations: Secondary | ICD-10-CM | POA: Diagnosis not present

## 2023-10-04 DIAGNOSIS — I471 Supraventricular tachycardia, unspecified: Secondary | ICD-10-CM | POA: Diagnosis not present

## 2023-10-04 DIAGNOSIS — G4733 Obstructive sleep apnea (adult) (pediatric): Secondary | ICD-10-CM | POA: Diagnosis not present

## 2023-10-04 DIAGNOSIS — R0789 Other chest pain: Secondary | ICD-10-CM | POA: Diagnosis not present

## 2023-10-04 DIAGNOSIS — I10 Essential (primary) hypertension: Secondary | ICD-10-CM | POA: Diagnosis not present

## 2023-10-04 DIAGNOSIS — E782 Mixed hyperlipidemia: Secondary | ICD-10-CM | POA: Diagnosis not present

## 2023-10-04 DIAGNOSIS — R5383 Other fatigue: Secondary | ICD-10-CM | POA: Diagnosis not present

## 2023-10-04 DIAGNOSIS — E531 Pyridoxine deficiency: Secondary | ICD-10-CM | POA: Diagnosis not present

## 2023-10-04 DIAGNOSIS — E611 Iron deficiency: Secondary | ICD-10-CM | POA: Diagnosis not present

## 2023-10-04 DIAGNOSIS — R000 Tachycardia, unspecified: Secondary | ICD-10-CM | POA: Diagnosis not present

## 2023-10-04 DIAGNOSIS — Z20822 Contact with and (suspected) exposure to covid-19: Secondary | ICD-10-CM | POA: Diagnosis not present

## 2023-10-04 DIAGNOSIS — I4719 Other supraventricular tachycardia: Secondary | ICD-10-CM | POA: Diagnosis not present

## 2023-10-04 DIAGNOSIS — Z79899 Other long term (current) drug therapy: Secondary | ICD-10-CM | POA: Diagnosis not present

## 2023-10-04 NOTE — Telephone Encounter (Signed)
Copied from CRM [id number]. Topic: General - Other >> Oct 03, 2023  2:50 PM Antony Haste wrote: Reason for CRM: Kathie Rhodes RN from [facility] states Jennifer [name] is new to their office, [name] discussed with Kathie Rhodes transportation issues that she is having, she also spoke about behavioral health. Kathie Rhodes states a Child psychotherapist from their office contacted [name] to discuss her concerns and she declined any help. She declined the transportation benefit they offered and the Healt[employer] is unsure what to do. They would like to discuss this further with her PCP or a nurse. [name][facility] Callback #: [phone]

## 2023-10-05 ENCOUNTER — Telehealth: Payer: Self-pay

## 2023-10-05 NOTE — Telephone Encounter (Signed)
Copied from CRM [id number]. Topic: Clinical - Medical Advice >> Oct 04, 2023  3:18 PM Nila Nephew wrote: Reason for CRM: Patient is calling to request a Freestyle Libre and related supplies prescription. Patient is requesting we initiate the PA she will likely need for it. Patient would like to be notified of when this prescription has been sent in.

## 2023-10-05 NOTE — Telephone Encounter (Signed)
Pt stated that they were supposed to get her connected with social services and she hasn't heard anything about this.   She stated that the transportation wasn't accommodating for her. Its only one way.   She was also concerned about some recent labs that she had done at another health system. She is concerned with her Creatinine

## 2023-10-05 NOTE — Telephone Encounter (Signed)
 Copied from CRM [id number]. Topic: Clinical - Lab/Test Results >> Oct 05, 2023  2:04 PM Shelah Lewandowsky wrote: Reason for CRM: Still waiting to discuss labs that had abnormal results, please call  [phone]

## 2023-10-07 NOTE — Telephone Encounter (Signed)
 We don't connect people with social services but we did have a Child psychotherapist contact her.  She would need to call or go online to the social security office if she needs services from them.

## 2023-10-09 ENCOUNTER — Ambulatory Visit: Payer: PPO | Admitting: Family Medicine

## 2023-10-09 ENCOUNTER — Telehealth (INDEPENDENT_AMBULATORY_CARE_PROVIDER_SITE_OTHER): Payer: Medicare HMO | Admitting: Family Medicine

## 2023-10-09 VITALS — BP 141/87 | HR 89 | Ht 62.0 in | Wt 231.0 lb

## 2023-10-09 DIAGNOSIS — I10 Essential (primary) hypertension: Secondary | ICD-10-CM | POA: Diagnosis not present

## 2023-10-09 DIAGNOSIS — R0789 Other chest pain: Secondary | ICD-10-CM | POA: Diagnosis not present

## 2023-10-09 DIAGNOSIS — E118 Type 2 diabetes mellitus with unspecified complications: Secondary | ICD-10-CM | POA: Diagnosis not present

## 2023-10-09 DIAGNOSIS — E876 Hypokalemia: Secondary | ICD-10-CM | POA: Diagnosis not present

## 2023-10-09 DIAGNOSIS — C519 Malignant neoplasm of vulva, unspecified: Secondary | ICD-10-CM | POA: Diagnosis not present

## 2023-10-09 DIAGNOSIS — G8929 Other chronic pain: Secondary | ICD-10-CM | POA: Diagnosis not present

## 2023-10-09 DIAGNOSIS — M25561 Pain in right knee: Secondary | ICD-10-CM | POA: Diagnosis not present

## 2023-10-09 DIAGNOSIS — F331 Major depressive disorder, recurrent, moderate: Secondary | ICD-10-CM | POA: Diagnosis not present

## 2023-10-09 DIAGNOSIS — F4310 Post-traumatic stress disorder, unspecified: Secondary | ICD-10-CM | POA: Diagnosis not present

## 2023-10-09 DIAGNOSIS — N94819 Vulvodynia, unspecified: Secondary | ICD-10-CM | POA: Diagnosis not present

## 2023-10-09 MED ORDER — LISINOPRIL 5 MG PO TABS: 5.0000 mg | ORAL_TABLET | Freq: Every day | ORAL | 1 refills | Status: DC

## 2023-10-09 MED ORDER — FREESTYLE LIBRE 2 READER DEVI: 1.0000 | Freq: Every day | 0 refills | Status: DC

## 2023-10-09 MED ORDER — EPINEPHRINE 0.3 MG/0.3ML IJ SOAJ: 0.3000 mg | INTRAMUSCULAR | 2 refills | Status: AC | PRN

## 2023-10-09 MED ORDER — FREESTYLE LIBRE 2 SENSOR MISC
1.0000 | 1 refills | Status: DC
  Filled 2023-10-09: qty 6, 84d supply, fill #0

## 2023-10-09 MED ORDER — POTASSIUM CHLORIDE 20 MEQ PO PACK: 20.0000 meq | PACK | Freq: Every day | ORAL | 2 refills | Status: AC

## 2023-10-09 MED ORDER — FREESTYLE LIBRE 2 SENSOR MISC: 1.0000 | 1 refills | Status: DC

## 2023-10-09 MED ORDER — FREESTYLE LIBRE 2 READER DEVI
1.0000 | Freq: Every day | 0 refills | Status: DC
  Filled 2023-10-09: qty 1, 30d supply, fill #0

## 2023-10-09 NOTE — Assessment & Plan Note (Signed)
 She does need a refill on her potassium.

## 2023-10-09 NOTE — Assessment & Plan Note (Signed)
 Do think she would benefit greatly from a continuous glucometer.  I think it could be helpful in her really better controlling her diet especially since she is very fearful to start medication.  She said she did check with her insurance and they should cover the freestyle Jennifer [name] so we will get that sent over to her pharmacy.

## 2023-10-09 NOTE — Assessment & Plan Note (Signed)
 She says she has not started the lisinopril.  She says the pharmacy never received the prescription.  Some going to send the prescription again today we had previously since it on January 3.

## 2023-10-09 NOTE — Assessment & Plan Note (Signed)
 Need to place new behavioral health referral since she did switch insurances

## 2023-10-09 NOTE — Progress Notes (Signed)
 Virtual Visit via Video Note  I connected with Jennifer [name] on 10/09/23 at  1:40 PM EST by a video enabled telemedicine application and verified that I am speaking with the correct person using two identifiers.   I discussed the limitations of evaluation and management by telemedicine and the availability of in person appointments. The patient expressed understanding and agreed to proceed.  Patient location: at home Provider location: in office    Established Patient Office Visit  Subjective  Patient ID: Jennifer [name], female    DOB: 1966/03/23  Age: 58 y.o. MRN: [id number]  No chief complaint on file.   HPI  Follow-up hypertension she has not picked up the lisinopril.  She says the pharmacy never notified her that it was ready.  She is needs to get a copy of her prior neurology records from Dr. Adaline Sill office she is tried calling the multiple times they are trying to get her set up with a new neurologist over at [facility].  She says she will need a new referral for [facility] as she did end up switching insurance plans.  She thinks she still has the initial paperwork from them. She did follow-up with the orthopedist and they are recommending potential knee surgery to include an arthroscopy and meniscectomy.  Likely in early April.  She did follow-up with the oncologist in regard to her Paget's.  She is currently declining biopsy.  She did follow-up with cardiology as well and discussed her chest pain and arm pain with them.  She says sometimes it and was feels like there is something moving under her skin.  She still feeling more of the knots in the soft tissue going up towards her shoulder.  She has occasional episodes where she gets a sensation of something coming over her face it is a most like a disorientation is not really a numbness.  In the other day she woke up with spots in her vision.  She did call her insurance to see if they would cover  freestyle libre and they said that they would so she would like to work on getting that approved.     ROS    Objective:     BP (!) 141/87   Pulse 89   Ht 5\' 2"  (1.575 m)   Wt 231 lb (104.8 kg)   LMP 06/25/2013   BMI 42.25 kg/m    Physical Exam   No results found for any visits on 10/09/23.    The ASCVD Risk score (Arnett DK, et al., 2019) failed to calculate for the following reasons:   Risk score cannot be calculated because patient has a medical history suggesting prior/existing ASCVD    Assessment & Plan:   Problem List Items Addressed This Visit       Cardiovascular and Mediastinum   Hypertension with intolerance to multiple antihypertensive drugs - Primary   She says she has not started the lisinopril.  She says the pharmacy never received the prescription.  Some going to send the prescription again today we had previously since it on January 3.      Relevant Medications   EPINEPHrine (EPIPEN 2-PAK) 0.3 mg/0.3 mL IJ SOAJ injection   lisinopril (ZESTRIL) 5 MG tablet     Endocrine   Diabetes mellitus type 2 with complications (HCC)   Do think she would benefit greatly from a continuous glucometer.  I think it could be helpful in her really better controlling her diet  especially since she is very fearful to start medication.  She said she did check with her insurance and they should cover the freestyle Josephine Igo so we will get that sent over to her pharmacy.      Relevant Medications   lisinopril (ZESTRIL) 5 MG tablet   Continuous Glucose Receiver (FREESTYLE LIBRE 2 READER) DEVI   Continuous Glucose Sensor (FREESTYLE LIBRE 2 SENSOR) MISC     Other   MDD (major depressive disorder), recurrent episode (HCC)   Need to place new behavioral health referral since she did switch insurances      Relevant Orders   Ambulatory referral to Behavioral Health   Hypokalemia   She does need a refill on her potassium.      Relevant Medications   potassium chloride  (KLOR-CON) 20 MEQ packet    No follow-ups on file.   I discussed the assessment and treatment plan with the patient. The patient was provided an opportunity to ask questions and all were answered. The patient agreed with the plan and demonstrated an understanding of the instructions.   The patient was advised to call back or seek an in-person evaluation if the symptoms worsen or if the condition fails to improve as anticipated.   Nani Gasser, MD

## 2023-10-10 ENCOUNTER — Other Ambulatory Visit: Payer: Self-pay

## 2023-10-10 ENCOUNTER — Other Ambulatory Visit (HOSPITAL_COMMUNITY): Payer: Self-pay

## 2023-10-10 DIAGNOSIS — R0789 Other chest pain: Secondary | ICD-10-CM | POA: Diagnosis not present

## 2023-10-10 DIAGNOSIS — G8929 Other chronic pain: Secondary | ICD-10-CM | POA: Diagnosis not present

## 2023-10-10 DIAGNOSIS — R1013 Epigastric pain: Secondary | ICD-10-CM | POA: Diagnosis not present

## 2023-10-10 DIAGNOSIS — M25561 Pain in right knee: Secondary | ICD-10-CM | POA: Diagnosis not present

## 2023-10-10 DIAGNOSIS — R030 Elevated blood-pressure reading, without diagnosis of hypertension: Secondary | ICD-10-CM | POA: Diagnosis not present

## 2023-10-10 DIAGNOSIS — Z753 Unavailability and inaccessibility of health-care facilities: Secondary | ICD-10-CM | POA: Diagnosis not present

## 2023-10-10 DIAGNOSIS — R6884 Jaw pain: Secondary | ICD-10-CM | POA: Diagnosis not present

## 2023-10-11 ENCOUNTER — Other Ambulatory Visit (HOSPITAL_COMMUNITY): Payer: Self-pay

## 2023-10-11 DIAGNOSIS — R079 Chest pain, unspecified: Secondary | ICD-10-CM | POA: Diagnosis not present

## 2023-10-15 ENCOUNTER — Telehealth (INDEPENDENT_AMBULATORY_CARE_PROVIDER_SITE_OTHER): Payer: PPO | Admitting: Family Medicine

## 2023-10-15 ENCOUNTER — Telehealth: Payer: Self-pay

## 2023-10-15 DIAGNOSIS — Z09 Encounter for follow-up examination after completed treatment for conditions other than malignant neoplasm: Secondary | ICD-10-CM

## 2023-10-15 NOTE — Telephone Encounter (Signed)
 Patient will need to reschedule.  We did call her and could not get a hold of her.  I also logged on to see if she would connect and we did not see her on.  Been some problems with the system I am not sure but we can certainly reschedule her.

## 2023-10-15 NOTE — Telephone Encounter (Signed)
 Copied from CRM [id number]. Topic: General - Other >> Oct 15, 2023 10:51 AM Carlatta H wrote: Reason for CRM: Patient would like a call back from the office about todays video visit//

## 2023-10-15 NOTE — Progress Notes (Unsigned)
 Called pt got her VM advised her to go ahead and log in for her my chart visit

## 2023-10-16 ENCOUNTER — Ambulatory Visit: Payer: Self-pay | Admitting: Family Medicine

## 2023-10-16 DIAGNOSIS — E611 Iron deficiency: Secondary | ICD-10-CM | POA: Diagnosis not present

## 2023-10-16 DIAGNOSIS — Z833 Family history of diabetes mellitus: Secondary | ICD-10-CM | POA: Diagnosis not present

## 2023-10-16 DIAGNOSIS — Z90710 Acquired absence of both cervix and uterus: Secondary | ICD-10-CM | POA: Diagnosis not present

## 2023-10-16 DIAGNOSIS — R079 Chest pain, unspecified: Secondary | ICD-10-CM | POA: Diagnosis not present

## 2023-10-16 DIAGNOSIS — Z87891 Personal history of nicotine dependence: Secondary | ICD-10-CM | POA: Diagnosis not present

## 2023-10-16 DIAGNOSIS — E531 Pyridoxine deficiency: Secondary | ICD-10-CM | POA: Diagnosis not present

## 2023-10-16 DIAGNOSIS — Z9049 Acquired absence of other specified parts of digestive tract: Secondary | ICD-10-CM | POA: Diagnosis not present

## 2023-10-16 DIAGNOSIS — I10 Essential (primary) hypertension: Secondary | ICD-10-CM | POA: Diagnosis not present

## 2023-10-16 DIAGNOSIS — E119 Type 2 diabetes mellitus without complications: Secondary | ICD-10-CM | POA: Diagnosis not present

## 2023-10-16 DIAGNOSIS — Z8616 Personal history of COVID-19: Secondary | ICD-10-CM | POA: Diagnosis not present

## 2023-10-16 DIAGNOSIS — R002 Palpitations: Secondary | ICD-10-CM | POA: Diagnosis not present

## 2023-10-16 DIAGNOSIS — R5383 Other fatigue: Secondary | ICD-10-CM | POA: Diagnosis not present

## 2023-10-16 DIAGNOSIS — D500 Iron deficiency anemia secondary to blood loss (chronic): Secondary | ICD-10-CM | POA: Diagnosis not present

## 2023-10-16 DIAGNOSIS — R42 Dizziness and giddiness: Secondary | ICD-10-CM | POA: Diagnosis not present

## 2023-10-16 DIAGNOSIS — Z8249 Family history of ischemic heart disease and other diseases of the circulatory system: Secondary | ICD-10-CM | POA: Diagnosis not present

## 2023-10-16 DIAGNOSIS — I471 Supraventricular tachycardia, unspecified: Secondary | ICD-10-CM | POA: Diagnosis not present

## 2023-10-16 DIAGNOSIS — Z79899 Other long term (current) drug therapy: Secondary | ICD-10-CM | POA: Diagnosis not present

## 2023-10-16 DIAGNOSIS — E782 Mixed hyperlipidemia: Secondary | ICD-10-CM | POA: Diagnosis not present

## 2023-10-16 DIAGNOSIS — I4719 Other supraventricular tachycardia: Secondary | ICD-10-CM | POA: Diagnosis not present

## 2023-10-16 DIAGNOSIS — Z8719 Personal history of other diseases of the digestive system: Secondary | ICD-10-CM | POA: Diagnosis not present

## 2023-10-16 DIAGNOSIS — Z803 Family history of malignant neoplasm of breast: Secondary | ICD-10-CM | POA: Diagnosis not present

## 2023-10-16 DIAGNOSIS — Z8542 Personal history of malignant neoplasm of other parts of uterus: Secondary | ICD-10-CM | POA: Diagnosis not present

## 2023-10-16 LAB — LAB REPORT - SCANNED

## 2023-10-16 NOTE — Progress Notes (Signed)
 Logged on.  Patient never connected for appointment.  She did call later in the day and said that she had problems trying to login.

## 2023-10-16 NOTE — Telephone Encounter (Signed)
 Chief Complaint: Low glucose reading Symptoms: Low energy, dizziness intermittent Frequency: Intermittent  Pertinent Negatives: Patient denies numbness and tingling in arms or legs Disposition: [] ED /[] Urgent Care (no appt availability in office) / [x] Appointment(In office/virtual)/ []  [place] [facility]/ [] Home Care/ [] Refused Recommended Disposition /[] [place] [facility]/ []  Follow-up with PCP Additional Notes: Patient called in stating she has been experiencing intermittent dizziness and is feeling off. Patient states when she checked her Libre glucose monitor it was 74. Patient states she was feeling loopy and dizzy earlier and this has been intermittent for a while on and off. Patient states she has done a lot today, more than normal, and unsure if this is the cause. While patient was on phone, she kept saying she just was feeling more exhausted than normal, and this RN stated she should go to ED if she feels very off her baseline. Patient denies breathing difficulty, numbness, weakness on one side of the body. Patient appt made for Thursday for further evaluation. Patient states if it is raining, she cannot come into the office because she doesn't have wipers on her vehicle. This RN advised patient to contact the office asap if she knows she cannot make appt slot.    Copied from CRM [id number]. Topic: Clinical - Red Word Triage >> Oct 16, 2023  4:08 PM Antony Haste wrote: Red Word that prompted transfer to Nurse Triage: PT is diabetic, she tested her blood sugar levels and it was at 74, she has been feeling some dizziness/constant fatigue since this morning. Reason for Disposition  [1] Morning (before breakfast) blood glucose < 80 mg/dL (4.4 mmol/L) AND [2] more than once in past week  Answer Assessment - Initial Assessment Questions 1. SYMPTOMS: "What symptoms are you concerned about?"     Dizziness, loopiness 2. ONSET:  "When did the symptoms start?"     Intermittent  3. BLOOD GLUCOSE:  "What is your blood glucose level?"      74, increased to 90s with test strips 4. USUAL RANGE: "What is your blood glucose level usually?" (e.g., usual fasting morning value, usual evening value)     150-200s 5. TYPE 1 or 2:  "Do you know what type of diabetes you have?"  (e.g., Type 1, Type 2, Gestational; doesn't know)      Type 2  6. INSULIN: "Do you take insulin?" "What type of insulin(s) do you use? What is the mode of delivery? (syringe, pen; injection or pump) "When did you last give yourself an insulin dose?" (i.e., time or hours/minutes ago) "How much did you give?" (i.e., how many units)     No 7. DIABETES PILLS: "Do you take any pills for your diabetes?" If Yes, ask: "What is the name of the medicine(s) that you take for high blood sugar?"     No 8. OTHER SYMPTOMS: "Do you have any symptoms?" (e.g., fever, frequent urination, difficulty breathing, vomiting)     Dizziness  9. LOW BLOOD GLUCOSE TREATMENT: "What have you done so far to treat the low blood glucose level?"     Ate hershey kisses 10. FOOD: "When did you last eat or drink?"       15-20 minutes 11. ALONE: "Are you alone right now or is someone with you?"        Husband  Protocols used: Diabetes - Low Blood Sugar-A-AH

## 2023-10-17 ENCOUNTER — Encounter: Payer: Self-pay | Admitting: Family Medicine

## 2023-10-17 DIAGNOSIS — R0600 Dyspnea, unspecified: Secondary | ICD-10-CM | POA: Diagnosis not present

## 2023-10-17 DIAGNOSIS — R0602 Shortness of breath: Secondary | ICD-10-CM | POA: Diagnosis not present

## 2023-10-17 NOTE — Telephone Encounter (Signed)
 Patient schdl for 10/18/23

## 2023-10-17 NOTE — Telephone Encounter (Signed)
 Schld for 10/18/23

## 2023-10-17 NOTE — Telephone Encounter (Signed)
 Attempted call to [facility] with [facility] [employer]. Left a voice mail message requesting a return call.

## 2023-10-18 ENCOUNTER — Encounter: Payer: Self-pay | Admitting: Family Medicine

## 2023-10-18 ENCOUNTER — Ambulatory Visit (INDEPENDENT_AMBULATORY_CARE_PROVIDER_SITE_OTHER): Admitting: Family Medicine

## 2023-10-18 ENCOUNTER — Telehealth: Payer: Self-pay | Admitting: Family Medicine

## 2023-10-18 VITALS — BP 145/79 | HR 79 | Ht 62.0 in | Wt 233.0 lb

## 2023-10-18 DIAGNOSIS — E118 Type 2 diabetes mellitus with unspecified complications: Secondary | ICD-10-CM | POA: Diagnosis not present

## 2023-10-18 DIAGNOSIS — E269 Hyperaldosteronism, unspecified: Secondary | ICD-10-CM | POA: Diagnosis not present

## 2023-10-18 DIAGNOSIS — I10 Essential (primary) hypertension: Secondary | ICD-10-CM

## 2023-10-18 NOTE — Assessment & Plan Note (Signed)
 To get updated labs for renin aldosterone and vitamin B12.  She will have those drawn tomorrow in [facility]

## 2023-10-18 NOTE — Progress Notes (Signed)
 Established Patient Office Visit  Subjective  Patient ID: Jennifer [name], female    DOB: 1966/03/07  Age: 58 y.o. MRN: [id number]  Chief Complaint  Patient presents with   Medical Management of Chronic Issues    HPI  She was able to get her continuous glucose monitor but feels like her sugars have been going up and then she had 1 episode where he actually went into the 70s.  The day that it went into the 70s she actually felt weak off balance and just did not feel good in general.  It was a day she went to have labs drawn and started feeling bad before the lab work was drawn.  Yesterday her glucose actually went to the 200s after she ate a [employer] meal for lunch but within 2 hours it did go back down to about 150.  She is just trying to correlate with how she is feeling with her glucose levels she did have problems with her first 2 sensors but this 1 seems to be working well she has been doing fingersticks to confirm.  She had 1 sensor where it was off by 30 points.  She is no longer using that 1.      ROS    Objective:     BP (!) 145/79   Pulse 79   Ht 5\' 2"  (1.575 m)   Wt 233 lb (105.7 kg)   LMP 06/25/2013   SpO2 98%   BMI 42.62 kg/m    Physical Exam Vitals and nursing note reviewed.  Constitutional:      Appearance: Normal appearance.  HENT:     Head: Normocephalic and atraumatic.  Pulmonary:     Effort: Pulmonary effort is normal.  Neurological:     Mental Status: She is alert.  Psychiatric:        Mood and Affect: Mood normal.      No results found for any visits on 10/18/23.    The ASCVD Risk score (Arnett DK, et al., 2019) failed to calculate for the following reasons:   Risk score cannot be calculated because patient has a medical history suggesting prior/existing ASCVD    Assessment & Plan:   Problem List Items Addressed This Visit       Cardiovascular and Mediastinum   Hypertension with intolerance to multiple antihypertensive  drugs   Is taking the metoprolol but did not take the lisinopril.  Blood pressures are still uncontrolled.  Repeat blood pressure is a little bit better.  She has had a few more headaches and a sort of a strange sensation over the top of her head lightly I do think that could be coming more from the blood pressure.        Endocrine   Hyperaldosteronism (HCC)   To get updated labs for renin aldosterone and vitamin B12.  She will have those drawn tomorrow in [facility].      Diabetes mellitus type 2 with complications (HCC)   Urged her to not get too fixated on the actual number as it is more the trends with the CGM and straining in the green zone is much as possible but the big thing is using it really as a tool to help her eat better and see how her body responds to different foods.  And seeing if maybe she could eat something that does not raise her sugar too much versus something else that really spikes it learning those trends and then better being  able to create new dietary habits going forward that will help keep her blood sugars well-controlled.  Get up-to-date A1c at next visit which should be in about 2 weeks.  Lab Results  Component Value Date   HGBA1C 6.6 (A) 07/20/2023         Relevant Orders   Aldosterone + renin activity w/ ratio   Vitamin B12   Other Visit Diagnoses       Hyperaldosteronism, unspecified (HCC)    -  Primary   Relevant Orders   Aldosterone + renin activity w/ ratio   Vitamin B12       No follow-ups on file.    Nani Gasser, MD

## 2023-10-18 NOTE — Assessment & Plan Note (Signed)
 Is taking the metoprolol but did not take the lisinopril.  Blood pressures are still uncontrolled.  Repeat blood pressure is a little bit better.  She has had a few more headaches and a sort of a strange sensation over the top of her head lightly I do think that could be coming more from the blood pressure.

## 2023-10-18 NOTE — Assessment & Plan Note (Signed)
 Urged her to not get too fixated on the actual number as it is more the trends with the CGM and straining in the green zone is much as possible but the big thing is using it really as a tool to help her eat better and see how her body responds to different foods.  And seeing if maybe she could eat something that does not raise her sugar too much versus something else that really spikes it learning those trends and then better being able to create new dietary habits going forward that will help keep her blood sugars well-controlled.  Get up-to-date A1c at next visit which should be in about 2 weeks.  Lab Results  Component Value Date   HGBA1C 6.6 (A) 07/20/2023

## 2023-10-18 NOTE — Telephone Encounter (Signed)
 Called and [facility] for [employer]

## 2023-10-18 NOTE — Telephone Encounter (Signed)
 [facility] with [employer] called.  They are concerned about Anedra's frequent ER visits(10 in the last 30 days), seems to have mental illness.  Koryn talked about having mold in her house and transportation issues but declined help with transportation.  They are willing to work with [place] to see what they can do to help. Phone number for [place], [phone].

## 2023-10-19 DIAGNOSIS — E269 Hyperaldosteronism, unspecified: Secondary | ICD-10-CM | POA: Diagnosis not present

## 2023-10-19 DIAGNOSIS — E118 Type 2 diabetes mellitus with unspecified complications: Secondary | ICD-10-CM | POA: Diagnosis not present

## 2023-10-20 DIAGNOSIS — B9789 Other viral agents as the cause of diseases classified elsewhere: Secondary | ICD-10-CM | POA: Diagnosis not present

## 2023-10-20 DIAGNOSIS — J069 Acute upper respiratory infection, unspecified: Secondary | ICD-10-CM | POA: Diagnosis not present

## 2023-10-20 DIAGNOSIS — R0789 Other chest pain: Secondary | ICD-10-CM | POA: Diagnosis not present

## 2023-10-20 DIAGNOSIS — R42 Dizziness and giddiness: Secondary | ICD-10-CM | POA: Diagnosis not present

## 2023-10-22 DIAGNOSIS — R0602 Shortness of breath: Secondary | ICD-10-CM | POA: Diagnosis not present

## 2023-10-22 NOTE — Progress Notes (Unsigned)
 [address] [place] [address] Dept: [phone]  FOLLOW UP NOTE  Patient ID: Jennifer [name], female    DOB: Jun 16, 1966  Age: 58 y.o. MRN: [id number] Date of Office Visit: 10/23/2023  Assessment  Chief Complaint: No chief complaint on file.  HPI Jennifer [name] is a 58 year old female who presents to the clinic for a follow-up visit.  She was last seen in this clinic on 09/06/2023 by Nehemiah Settle, FNP, for evaluation of asthma, upper airway cough syndrome, and phantosmia.  Her last environmental allergy testing via lab was on 07/16/2020 and was negative to the adult environmental panel.  Her last food allergy skin testing to selected foods was negative on 03/19/2023. Discussed the use of AI scribe software for clinical note transcription with the patient, who gave verbal consent to proceed.  History of Present Illness             Drug Allergies:  Allergies  Allergen Reactions   Azithromycin Shortness Of Breath    Lip swelling, SOB.      Ciprofloxacin Swelling    REACTION: tongue swells   Codeine Shortness Of Breath   Erythromycin Base Itching and Rash   Sulfa Antibiotics Other (See Comments), Rash and Shortness Of Breath   Sulfasalazine Rash and Shortness Of Breath    Other reaction(s): Other (See Comments) Other reaction(s): SHORTNESS OF BREATH   Telmisartan Swelling    Tongue swelling, Micardis   Ace Inhibitors Cough   Aspirin Hives and Other (See Comments)    flushing   Atenolol Other (See Comments)    Squeezing chest sensation   Avelox [Moxifloxacin Hcl In Nacl] Itching        Beta Adrenergic Blockers Other (See Comments)    Feels like chest tightening labetalol, bystolic  Feels like chest tightening "Metoprolol"    Buspar [Buspirone] Other (See Comments)    Light headed   Butorphanol Tartrate Other (See Comments)    Patient aggitated   Cetirizine Hives and Rash        Clonidine Hcl     REACTION: makes blood pressure high   Cortisone      Feels like she is going crazy   Erythromycin Rash   Fentanyl Other (See Comments)    aggressive    Fluoxetine Hcl Other (See Comments)    REACTION: headaches   Ketorolac Tromethamine     jittery   Lidocaine Other (See Comments)    When it involves the throat,    Lisinopril Cough   Metoclopramide Hcl Other (See Comments)    Dystonic reaction   Midazolam Other (See Comments)    agitation Slow to wake up   Montelukast Other (See Comments)    Singulair   Montelukast Sodium Other (See Comments)    DOES NOT REMEMBER  Don't remember-told not to take   Naproxen Other (See Comments)    FLUSHING Pt states she took Ibuprofen today (10/08/19)   Nitroglycerin Other (See Comments)    Unknown   Paroxetine Other (See Comments)    REACTION: headaches   Penicillins Rash   Pravastatin Other (See Comments)    Myalgias   Promethazine Other (See Comments)    Dystonic reaction   Promethazine Hcl Other (See Comments)    jittery   Quinolones Swelling and Rash   Serotonin Reuptake Inhibitors (Ssris) Other (See Comments)    Headache Effexor, prozac, zoloft,    Sertraline Hcl     REACTION: headaches   Stelazine [Trifluoperazine] Other (See Comments)  Dystonic reaction   Tobramycin Itching and Rash   Trifluoperazine Hcl     dystonic   Atrovent Nasal Spray [Ipratropium]     Tachycardia and shaking   Diltiazem Other (See Comments)    Chest pain   Iodinated Contrast Media     Other reaction(s): Other (See Comments) Chest heaviness/sob   Metoprolol     Other reaction(s): OTHER  Other Reaction(s): Dizziness   Polyethylene Glycol 3350     Other reaction(s): Laryngeal Edema (ALLERGY)   Propoxyphene    Adhesive [Tape] Rash    EKG monitor patches, some tapes Blisters, rash, itching, welts.   Butorphanol Anxiety    Patient agitated   Ceftriaxone Rash    rocephin   Iron Rash    Flushing with certain IV types   Metoclopramide Itching and Other (See Comments)    Dystonic reaction    Metronidazole Rash   Other Rash and Other (See Comments)    Uncoded Allergy. Allergen: steriods, Other Reaction: Not Assessed Other reaction(s): Flushing (ALLERGY/intolerance), GI Upset (intolerance), Hypertension (intolerance), Increased Heart Rate (intolerance), Mental Status Changes (intolerance), Other (See Comments), Tachycardia / Palpitations  (intolerance) Hospital gowns leave a rash.    Prednisone Anxiety and Palpitations   Prochlorperazine Anxiety    Compazine:  Dystonic reaction   Venlafaxine Anxiety   Zyrtec [Cetirizine Hcl] Rash    All over body    Physical Exam: LMP 06/25/2013    Physical Exam  Diagnostics:    Assessment and Plan: No diagnosis found.  No orders of the defined types were placed in this encounter.   There are no Patient Instructions on file for this visit.  No follow-ups on file.    Thank you for the opportunity to care for this patient.  Please do not hesitate to contact me with questions.  Thermon Leyland, FNP [facility]

## 2023-10-22 NOTE — Patient Instructions (Incomplete)
 Asthma Continue Xopenex 2 puffs once every 4-6 hours if needed for cough or wheeze You may use Xopenex 2 puffs 5 to 15 minutes before activity to decrease cough or wheeze For asthma flare begin Qvar 40-2 puffs once a day for 1-2 weeks or until cough and wheeze free.   Thrush Begin nystatin swish and swallow 5 ml 4 times a day for 1-2 weeks with any symptoms of thrush  Upper airway cough syndrome Continue to avoid known triggers Continue Flonase 2 sprays in each nostril once a day if needed for stuffy nose Continue azelastine 2 sprays in each nostril up to twice a day if needed for nasal symptoms Consider saline nasal rinses as needed for nasal symptoms. 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  Follow up in 3-6 months or sooner if needed.

## 2023-10-23 ENCOUNTER — Ambulatory Visit (INDEPENDENT_AMBULATORY_CARE_PROVIDER_SITE_OTHER): Payer: PPO | Admitting: Family Medicine

## 2023-10-23 ENCOUNTER — Encounter: Payer: Self-pay | Admitting: Family Medicine

## 2023-10-23 VITALS — BP 158/82 | HR 104 | Temp 97.7°F | Resp 18 | Wt 231.0 lb

## 2023-10-23 DIAGNOSIS — R442 Other hallucinations: Secondary | ICD-10-CM | POA: Insufficient documentation

## 2023-10-23 DIAGNOSIS — J4520 Mild intermittent asthma, uncomplicated: Secondary | ICD-10-CM | POA: Diagnosis not present

## 2023-10-23 DIAGNOSIS — R058 Other specified cough: Secondary | ICD-10-CM

## 2023-10-23 DIAGNOSIS — K219 Gastro-esophageal reflux disease without esophagitis: Secondary | ICD-10-CM | POA: Diagnosis not present

## 2023-10-23 DIAGNOSIS — Z8619 Personal history of other infectious and parasitic diseases: Secondary | ICD-10-CM | POA: Diagnosis not present

## 2023-10-23 MED ORDER — NYSTATIN 100000 UNIT/ML MT SUSP: OROMUCOSAL | 2 refills | Status: DC

## 2023-10-23 MED ORDER — FAMOTIDINE 20 MG PO TABS: 20.0000 mg | ORAL_TABLET | Freq: Every day | ORAL | 5 refills | Status: DC | PRN

## 2023-10-23 NOTE — Telephone Encounter (Signed)
 This has been sent in

## 2023-10-23 NOTE — Telephone Encounter (Signed)
 Pt has been seen and advised.

## 2023-10-23 NOTE — Addendum Note (Signed)
 Addended by: Modesto Charon on: 10/23/2023 04:27 PM   Modules accepted: Orders

## 2023-10-24 DIAGNOSIS — E118 Type 2 diabetes mellitus with unspecified complications: Secondary | ICD-10-CM | POA: Diagnosis not present

## 2023-10-25 ENCOUNTER — Telehealth: Payer: Medicare HMO | Admitting: Family Medicine

## 2023-10-25 DIAGNOSIS — R9431 Abnormal electrocardiogram [ECG] [EKG]: Secondary | ICD-10-CM | POA: Diagnosis not present

## 2023-10-25 DIAGNOSIS — R0602 Shortness of breath: Secondary | ICD-10-CM | POA: Diagnosis not present

## 2023-10-25 DIAGNOSIS — J069 Acute upper respiratory infection, unspecified: Secondary | ICD-10-CM | POA: Diagnosis not present

## 2023-10-25 DIAGNOSIS — R42 Dizziness and giddiness: Secondary | ICD-10-CM | POA: Diagnosis not present

## 2023-10-25 DIAGNOSIS — I10 Essential (primary) hypertension: Secondary | ICD-10-CM | POA: Diagnosis not present

## 2023-10-25 DIAGNOSIS — Z20822 Contact with and (suspected) exposure to covid-19: Secondary | ICD-10-CM | POA: Diagnosis not present

## 2023-10-25 DIAGNOSIS — R059 Cough, unspecified: Secondary | ICD-10-CM | POA: Diagnosis not present

## 2023-10-25 DIAGNOSIS — J45909 Unspecified asthma, uncomplicated: Secondary | ICD-10-CM | POA: Diagnosis not present

## 2023-10-29 ENCOUNTER — Telehealth: Payer: PPO | Admitting: Family Medicine

## 2023-10-30 ENCOUNTER — Ambulatory Visit: Payer: Self-pay | Admitting: Family Medicine

## 2023-10-30 DIAGNOSIS — E118 Type 2 diabetes mellitus with unspecified complications: Secondary | ICD-10-CM

## 2023-10-30 MED ORDER — LANCET DEVICE MISC: 1.0000 | Freq: Three times a day (TID) | 0 refills | Status: AC

## 2023-10-30 MED ORDER — LANCETS MISC. MISC
1.0000 | Freq: Every day | 0 refills | Status: AC
Start: 2023-10-30 — End: 2023-11-29

## 2023-10-30 MED ORDER — BLOOD GLUCOSE TEST VI STRP: 1.0000 | ORAL_STRIP | Freq: Every day | 0 refills | Status: DC | PRN

## 2023-10-30 MED ORDER — FREESTYLE LIBRE 2 SENSOR MISC: 1.0000 | 1 refills | Status: DC

## 2023-10-30 NOTE — Addendum Note (Signed)
 Addended by: Nani Gasser D on: 10/30/2023 09:42 PM   Modules accepted: Orders

## 2023-10-30 NOTE — Telephone Encounter (Signed)
 We can send order for new sensor but they won't pay for a new reader.     Ok to send a rx for glucmeter strips to test once a day.   Meds ordered this encounter  Medications   Continuous Glucose Sensor (FREESTYLE LIBRE 2 SENSOR) MISC    Sig: Apply every 14 (fourteen) days to check blood sugar as directed.    Dispense:  6 each    Refill:  1   Glucose Blood (BLOOD GLUCOSE TEST STRIPS) STRP    Sig: 1 each by In Vitro route daily as needed. May substitute to any manufacturer covered by patient's insurance.    Dispense:  100 strip    Refill:  0   Lancet Device MISC    Sig: 1 each by Does not apply route in the morning, at noon, and at bedtime. May substitute to any manufacturer covered by patient's insurance.    Dispense:  1 each    Refill:  0   Lancets Misc. MISC    Sig: 1 each by Does not apply route daily. May substitute to any manufacturer covered by patient's insurance.    Dispense:  100 each    Refill:  0

## 2023-10-30 NOTE — Telephone Encounter (Signed)
 Chief Complaint: needs new BGM, also feeling "off" Frequency: every morning Pertinent Negatives: Patient denies CP, dizziness, lightheadedness,  Disposition: [] ED /[] Urgent Care (no appt availability in office) / [x] Appointment(In office/virtual)/ []  [place] [facility]/ [] Home Care/ [] Refused Recommended Disposition /[] [place] [facility]/ []  Follow-up with PCP Additional Notes: Pt Aox4/4. Speaking clear. Pt would like to be seen today or tomorrow for a VV. Pt states that she is having issues with her meter. Pt states that her meter is obsolete. Pt would like libre plus 3 strips and new machine, with 7 day sensor. Pt states that her BS's have been swaying high and low, even without eating in between readings.  Pt states that her BGM this morning was 56, minutes later was 119, with no food/beverage intake. Pt did finger stick, 118. Pt states that in the past the difference has been 50-100 points off at times. Pt would like all supplies sent to her local [facility] not the mail in. Pt states that she is almost out of test strips and needs new ones. Pt would also like an A1C today.  Pt states that she feels "off" today. Denies dizziness, lightheadedness, CP. Pt states that she doesn't know how to describe it other than "off".  Pt states that she feels this way every morning and that this is not unusual. Pt states that she is experiencing stressors. Pt states that she has never been educated on DM. Pt states that her pre-op appt was canceled today. Pt states that she has been having increased thirst and urination recently again requesting A1C be drawn. Pt will be going to [facility] to have lab work drawn today, states if there is A1C added on to her lab work, please fax the [facility]. Pt advised of care instructions for Epic.  Copied from CRM [id number]. Topic: Clinical - Red Word Triage >> Oct 30, 2023  8:09 AM Nyra Capes wrote: Red Word that prompted transfer to Nurse Triage: Patient  glucometer is not working right, numbers are all over the place, asking for different supplies for finger stick,     feel light headed, little shaky,  reading now is 118  asking for A1C test and wanting an appt today Reason for Disposition  [1] Caller has NON-URGENT medication or insulin pump question AND [2] triager unable to answer question  Answer Assessment - Initial Assessment Questions 1. BLOOD GLUCOSE: "What is your blood glucose level?"      118 2. ONSET: "When did you check the blood glucose?"     About hour ago 3. USUAL RANGE: "What is your glucose level usually?" (e.g., usual fasting morning value, usual evening value)     Has been fluctuating more than normal 5. TYPE 1 or 2:  "Do you know what type of diabetes you have?"  (e.g., Type 1, Type 2, Gestational; doesn't know)      Type 2 6. INSULIN: "Do you take insulin?" "What type of insulin(s) do you use? What is the mode of delivery? (syringe, pen; injection or pump)?"      denies 7. DIABETES PILLS: "Do you take any pills for your diabetes?" If Yes, ask: "Have you missed taking any pills recently?"     denies 8. OTHER SYMPTOMS: "Do you have any symptoms?" (e.g., fever, frequent urination, difficulty breathing, dizziness, weakness, vomiting)     Feels "off", pt cannot be more descriptive. States she is not dizzy, not weakness, pt states that she feels off every morning. Pt states that  she is more depressed than normal.  Protocols used: Diabetes - High Blood Sugar-A-AH

## 2023-10-31 ENCOUNTER — Encounter: Payer: Self-pay | Admitting: Family Medicine

## 2023-10-31 ENCOUNTER — Other Ambulatory Visit: Payer: Self-pay | Admitting: Family Medicine

## 2023-10-31 ENCOUNTER — Telehealth (INDEPENDENT_AMBULATORY_CARE_PROVIDER_SITE_OTHER): Admitting: Family Medicine

## 2023-10-31 DIAGNOSIS — E118 Type 2 diabetes mellitus with unspecified complications: Secondary | ICD-10-CM

## 2023-10-31 MED ORDER — FREESTYLE LIBRE 3 SENSOR MISC: 1.0000 | 6 refills | Status: DC

## 2023-10-31 MED ORDER — FREESTYLE LIBRE 3 READER DEVI: 1.0000 | Freq: Every day | 0 refills | Status: DC

## 2023-10-31 NOTE — Assessment & Plan Note (Addendum)
 We had previously written a prescription for the liquid metformin.  She can call her pharmacy and have them fill the medication.  We should not need to send a new one it still should be actively on file especially if she never picked it up.  We also discussed maybe getting her connected with a diabetes educator that could spend a little bit more time with her in detail about diet and blood sugar levels.  We discussed goal of fasting blood sugars under 130.  Also discussed goal of blood sugar under 182 hours after eating.  Also if the blood sugars are low she needs to eat something with a little sugar in protein such as a teaspoon of peanut butter would be a perfect example.  Will send over new order for updated CGM as well as strips and lancets for regular glucometer.  She has new insurance this year so hopefully they will get it covered.  And hopefully she will notice more consistent readings.  Continue to work on [employer] eating minimal amounts of starch really increasing vegetable intake and getting in lean proteins.

## 2023-10-31 NOTE — Progress Notes (Signed)
 Pt stated that she called [employer]estyle about getting the sensor and reader for freestyle 3 and also strips. She said that she has contacted her insurance company and was told that they will pay for this. She is asking for a referral for Endo that is close to her home that can help her to manage this because she is overwhelmed. She lives in [place].   This will need to be sent to [facility] [address]

## 2023-10-31 NOTE — Progress Notes (Signed)
 Virtual Visit via Video Note  I connected with Jennifer [name] on 10/31/23 at 10:10 AM EDT by a video enabled telemedicine application and verified that I am speaking with the correct person using two identifiers.   I discussed the limitations of evaluation and management by telemedicine and the availability of in person appointments. The patient expressed understanding and agreed to proceed.  Patient location: at home Provider location: in office  Subjective:    CC:  No chief complaint on file.   HPI:  She was seen in the emergency department 3 times this month 1 for dizziness and chest pain the second 2 for viral respiratory symptoms.  She did follow-up with her asthma and allergy specialist on March 11.  She is feeling better.  She has been really frustrated and stressed about her blood sugars she has been wearing the CGM but says that when she checks the numbers they are sometimes very different is much as 100 points different when she does a fingerstick though sometimes it is only off by maybe 1 point.  She did call her insurance and they will pay for the [facility] freestyle 3+ as well as the strips.  She just feels overwhelmed and does not quite know how to manage her sugar levels.  She is not currently taking the metformin that is on the medication.  She would have like to have a better understanding.   Past medical history, Surgical history, Family history not pertinant except as noted below, Social history, Allergies, and medications have been entered into the medical record, reviewed, and corrections made.    Objective:    General: Speaking clearly in complete sentences without any shortness of breath.  Alert and oriented x3.  Normal judgment. No apparent acute distress.    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  She can call her pharmacy and have them fill the medication.  We should not need to send a new one it still should be actively on file especially if she never picked it up.  We also discussed maybe getting her connected with a diabetes educator that could spend a little bit more time with her in detail about diet and blood sugar levels.  We discussed goal of fasting blood sugars under 130.  Also discussed goal of blood sugar under 182 hours after eating.  Also if the blood sugars are low she needs to eat something with a little sugar in protein such as a teaspoon of peanut butter would be a perfect example.  Will send over new order for updated CGM as well as strips and lancets for regular glucometer.  She has new insurance this year so hopefully they will get it covered.  And hopefully she will notice more consistent readings.  Continue to work on [employer] eating minimal amounts of starch really increasing vegetable intake and getting in lean proteins.      Relevant Medications   Continuous Glucose Sensor (FREESTYLE LIBRE 3 SENSOR) MISC   Continuous Glucose Receiver (FREESTYLE LIBRE 3 READER) DEVI   Other Relevant Orders   AMB Referral [facility]BCI Care Management    Orders Placed This Encounter  Procedures   AMB Referral VBCI Care Management    Referral Priority:   Routine    Referral Type:  Consultation    Referral Reason:   Care Coordination    Number of Visits Requested:   1    Meds ordered this encounter  Medications   Continuous Glucose Sensor (FREESTYLE LIBRE 3 SENSOR) MISC    Sig: 1 each by Does not apply route every 14 (fourteen) days. Plus. Place 1 sensor on the skin every 14 days. Use to check glucose continuously    Dispense:  2 each    Refill:  6   Continuous Glucose Receiver (FREESTYLE LIBRE 3 READER) DEVI    Sig: 1 each by Does not apply route daily. Plus    Dispense:  1 each    Refill:  0    I discussed the assessment and treatment plan with  the patient. The patient was provided an opportunity to ask questions and all were answered. The patient agreed with the plan and demonstrated an understanding of the instructions.   The patient was advised to call back or seek an in-person evaluation if the symptoms worsen or if the condition fails to improve as anticipated.  I spent 25 minutes on the day of the encounter to include pre-visit record review, face-to-face time with the patient and post visit ordering of test.   Nani Gasser, MD

## 2023-11-01 ENCOUNTER — Telehealth: Payer: Self-pay

## 2023-11-01 ENCOUNTER — Telehealth: Admitting: Family Medicine

## 2023-11-01 DIAGNOSIS — Z6841 Body Mass Index (BMI) 40.0 and over, adult: Secondary | ICD-10-CM | POA: Diagnosis not present

## 2023-11-01 DIAGNOSIS — E119 Type 2 diabetes mellitus without complications: Secondary | ICD-10-CM | POA: Diagnosis not present

## 2023-11-01 DIAGNOSIS — E7800 Pure hypercholesterolemia, unspecified: Secondary | ICD-10-CM | POA: Diagnosis not present

## 2023-11-01 DIAGNOSIS — I10 Essential (primary) hypertension: Secondary | ICD-10-CM | POA: Diagnosis not present

## 2023-11-01 NOTE — Telephone Encounter (Signed)
 Copied from CRM [id number]. Topic: Clinical - Prescription Issue >> Nov 01, 2023  8:31 AM Nila Nephew wrote: Reason for CRM:  Patient calling in to state that instead of the [facility] 3 prescribed, she stated she must have it as the [employer] 3 PLUS. Patient states she needs it to have the PLUS behind it so it will be covered and will not be obsolete by September. Patient also states she needs the 7 day sensor instead of the 14 day sensor. Emphasis on PLUS noted in visit encounter.   Patient states needs this fixed today. Patient also inquiring if orders are placed to also have A1C/BMP done at the hospital, as patient is planning to have the drawn tomorrow.  Patient requesting to be notified when order has been fixed.

## 2023-11-02 ENCOUNTER — Telehealth: Payer: Self-pay | Admitting: Family Medicine

## 2023-11-02 ENCOUNTER — Other Ambulatory Visit: Payer: Self-pay | Admitting: *Deleted

## 2023-11-02 ENCOUNTER — Telehealth: Payer: Self-pay | Admitting: *Deleted

## 2023-11-02 ENCOUNTER — Telehealth: Payer: Self-pay

## 2023-11-02 DIAGNOSIS — I4719 Other supraventricular tachycardia: Secondary | ICD-10-CM | POA: Diagnosis not present

## 2023-11-02 DIAGNOSIS — E782 Mixed hyperlipidemia: Secondary | ICD-10-CM | POA: Diagnosis not present

## 2023-11-02 DIAGNOSIS — E611 Iron deficiency: Secondary | ICD-10-CM | POA: Diagnosis not present

## 2023-11-02 DIAGNOSIS — E118 Type 2 diabetes mellitus with unspecified complications: Secondary | ICD-10-CM

## 2023-11-02 DIAGNOSIS — I471 Supraventricular tachycardia, unspecified: Secondary | ICD-10-CM | POA: Diagnosis not present

## 2023-11-02 DIAGNOSIS — R5383 Other fatigue: Secondary | ICD-10-CM | POA: Diagnosis not present

## 2023-11-02 DIAGNOSIS — I10 Essential (primary) hypertension: Secondary | ICD-10-CM | POA: Diagnosis not present

## 2023-11-02 DIAGNOSIS — E531 Pyridoxine deficiency: Secondary | ICD-10-CM | POA: Diagnosis not present

## 2023-11-02 DIAGNOSIS — R002 Palpitations: Secondary | ICD-10-CM | POA: Diagnosis not present

## 2023-11-02 DIAGNOSIS — Z79899 Other long term (current) drug therapy: Secondary | ICD-10-CM | POA: Diagnosis not present

## 2023-11-02 MED ORDER — FREESTYLE LIBRE 2 PLUS SENSOR MISC: 1.0000 | 99 refills | Status: DC

## 2023-11-02 MED ORDER — FREESTYLE LIBRE 3 SENSOR MISC: 1.0000 | 6 refills | Status: DC

## 2023-11-02 MED ORDER — FREESTYLE LIBRE 3 READER DEVI: 1.0000 | Freq: Every day | 0 refills | Status: DC

## 2023-11-02 MED ORDER — BLOOD GLUCOSE MONITORING SUPPL DEVI: 1.0000 | Freq: Three times a day (TID) | 0 refills | Status: DC

## 2023-11-02 MED ORDER — BLOOD GLUCOSE MONITORING SUPPL DEVI
1.0000 | Freq: Three times a day (TID) | 0 refills | Status: DC
Start: 2023-11-02 — End: 2023-11-02

## 2023-11-02 NOTE — Telephone Encounter (Signed)
 Pt. Calling about Freestyle Libre - needs "Plus" and also needs A1c order. "I'm very frustrated, I've been trying to get this done all week." Please advise pt. Asking for a call back today.

## 2023-11-02 NOTE — Telephone Encounter (Signed)
 Copied from CRM [id number]. Topic: Clinical - Prescription Issue >> Nov 02, 2023  9:40 AM Nila Nephew wrote: Reason for CRM: Patient calling in once again regarding freestyle libre 3 plus. Patient requested to have a nurse correct this now. Spoke with nurse triage and stated would be able to talk to patient. Merged patient and nurse triage together.

## 2023-11-02 NOTE — Telephone Encounter (Signed)
 Copied from CRM [id number]. Topic: Clinical - Request for Lab/Test Order >> Nov 02, 2023 12:42 PM Ivette P wrote: Reason for CRM: Dorathy Daft was calling in to get a new order for pt BMP order. And a one time A1C for today, pt has already been seen a blood has been drawn. IF possible if orders can be sent today.    1 time A1c order  2 weeks ongoing BMP order  Information:  Dorathy Daft [phone]  Email - [email or web address]  Fax - [phone]

## 2023-11-02 NOTE — Telephone Encounter (Signed)
 Pharmacy has everything they need. They will process today.

## 2023-11-02 NOTE — Telephone Encounter (Signed)
 Ok for single order for A1C  and BMP recurrent .

## 2023-11-02 NOTE — Telephone Encounter (Signed)
 Task completed. Pharmacy has what they need to proceed.

## 2023-11-02 NOTE — Telephone Encounter (Signed)
 The [employer] Plus has been sent to the pharmacy.

## 2023-11-02 NOTE — Progress Notes (Signed)
 Care Guide Pharmacy Note  11/02/2023 Name: ZYKIRA MATLACK MRN: [id number] DOB: 1966/05/11  Referred By: Agapito Games, MD Reason for referral: Care Coordination (Outreach to schedule referral with pharmacist )   DASJA BRASE is a 58 y.o. year old female who is a primary care patient of Linford Arnold, Barbarann Ehlers, MD.  LYLE LEISNER was referred to the pharmacist for assistance related to: DMII  An unsuccessful telephone outreach was attempted today to contact the patient who was referred to the pharmacy team for assistance with medication management. Additional attempts will be made to contact the patient.  Burman Nieves, CMA, Care Guide [facility]  [facility], [facility] Guide Direct Dial: [phone]  Fax: [phone] Website: [email or web address]

## 2023-11-06 DIAGNOSIS — R002 Palpitations: Secondary | ICD-10-CM | POA: Diagnosis not present

## 2023-11-06 DIAGNOSIS — R079 Chest pain, unspecified: Secondary | ICD-10-CM | POA: Diagnosis not present

## 2023-11-06 DIAGNOSIS — I10 Essential (primary) hypertension: Secondary | ICD-10-CM | POA: Diagnosis not present

## 2023-11-06 DIAGNOSIS — R000 Tachycardia, unspecified: Secondary | ICD-10-CM | POA: Diagnosis not present

## 2023-11-06 DIAGNOSIS — E1165 Type 2 diabetes mellitus with hyperglycemia: Secondary | ICD-10-CM | POA: Diagnosis not present

## 2023-11-07 ENCOUNTER — Telehealth: Payer: Self-pay

## 2023-11-07 NOTE — Telephone Encounter (Signed)
 Called and left a voice mail message for Dorathy Daft to return our call to inquire about this bloodwork as has now been 5 days.

## 2023-11-07 NOTE — Progress Notes (Signed)
 Care Guide Pharmacy Note  11/07/2023 Name: JULIANE GUEST MRN: [id number] DOB: 01/29/1966  Referred By: Agapito Games, MD Reason for referral: Care Coordination (Outreach to schedule referral with pharmacist )   DEOLA REWIS is a 58 y.o. year old female who is a primary care patient of Linford Arnold, Barbarann Ehlers, MD.  SHONTAE ROSILES was referred to the pharmacist for assistance related to: DMII  Pt declined referral at this time - wanted to discuss with provider didn't understand pharmacy referral as explained that pharmacist would provide DM education and food intake blood sugars. Transferred call to office as requested   Burman Nieves, CMA, Care Guide [facility], [place] Guide Direct Dial: [phone]  Fax: [phone] Website: [email or web address]

## 2023-11-07 NOTE — Telephone Encounter (Unsigned)
 Copied from CRM [id number]. Topic: Referral - Question >> Nov 07, 2023  9:35 AM Patsy Lager T wrote: Reason for CRM: patient said she was called and need to clear some things up about the the referral. Please f/u with patient

## 2023-11-12 ENCOUNTER — Telehealth (INDEPENDENT_AMBULATORY_CARE_PROVIDER_SITE_OTHER): Admitting: Family Medicine

## 2023-11-12 ENCOUNTER — Telehealth: Payer: Self-pay

## 2023-11-12 DIAGNOSIS — F331 Major depressive disorder, recurrent, moderate: Secondary | ICD-10-CM

## 2023-11-12 DIAGNOSIS — B9689 Other specified bacterial agents as the cause of diseases classified elsewhere: Secondary | ICD-10-CM | POA: Diagnosis not present

## 2023-11-12 DIAGNOSIS — J019 Acute sinusitis, unspecified: Secondary | ICD-10-CM

## 2023-11-12 DIAGNOSIS — R1013 Epigastric pain: Secondary | ICD-10-CM

## 2023-11-12 MED ORDER — LEVOFLOXACIN 250 MG PO TABS: 250.0000 mg | ORAL_TABLET | Freq: Every day | ORAL | 0 refills | Status: DC

## 2023-11-12 NOTE — Telephone Encounter (Signed)
 Copied from CRM [id number]. Topic: Clinical - Prescription Issue >> Nov 12, 2023 10:09 AM Tiffany H wrote: Reason for CRM: Allegra Grana called to report a possible allergy interaction on levofloxacin (LEVAQUIN) 250 MG tablet [[id number]]. The potential allergy reported by patient is just listed vaguely as "heart". This allergy was last reported in 2020. Please follow up with patient. If the allergy is no longer valid, please re-send prescription with a note acknowledging the allergy and why Levaquin is safe for patient to take.   Phone: [phone]

## 2023-11-12 NOTE — Telephone Encounter (Signed)
 Patient reported to me during the visit today that she can take it as long as it is a low visit.  That she was warned about the potential for QT prolongation with fluoroquinolones and so encouraged not to use them unless really needed.  Okay to fill medication.

## 2023-11-12 NOTE — Progress Notes (Unsigned)
 Pt reports that she has been experiencing sinus issues.  She also stated that she would like to start a depression medication but would like something that doesn't take a long time to build up in her system. She stated that she did a Drug monitoring test that would tell her which medications would work best for her.  There are some medications she cannot take due to her other conditions.

## 2023-11-12 NOTE — Progress Notes (Unsigned)
 Virtual Visit via Video Note  I connected with Jennifer [name] on 11/13/23 at  9:30 AM EDT by a video enabled telemedicine application and verified that I am speaking with the correct person using two identifiers.   I discussed the limitations of evaluation and management by telemedicine and the availability of in person appointments. The patient expressed understanding and agreed to proceed.  Patient location: at home Provider location: in office  Subjective:    CC:  No chief complaint on file.   HPI:  Noticing having more abdominal pain below her sternum and stools are more flat.  Took a dulcolax today.  Feels like sore and wonders if can get hernia.   Feels like her sinuses are worse and having pain in the back of her throat. Doing her nasal saline rinses.  Green/yellow drainage and draining into her chest. Using her allergy meds.   Feeling more depressed and down.  She is at the point where she would like to consider medication.  She has a pro bono therapist but she is only really able to meet with her once a month.  She really needs to be better meet with a therapist a little bit more regularly she was seeing someone but then they told her that they could not see her for grief which seems a little unusual.  Past medical history, Surgical history, Family history not pertinant except as noted below, Social history, Allergies, and medications have been entered into the medical record, reviewed, and corrections made.    Objective:    General: Speaking clearly in complete sentences without any shortness of breath.  Alert and oriented x3.  Normal judgment. No apparent acute distress.    Impression and Recommendations:    Problem List Items Addressed This Visit       Other   MDD (major depressive disorder), recurrent episode (HCC)   Struggling to get in with a regular counselor she has someone she is working with pro bono but they are very limited in their accessibility but  she is still able to access her at least once a month.  Will work on reaching back out to [employer] according to the referral that we had sent recently it said she was still an active patient so did not need a new referral so we will give her their phone number and have her call to get scheduled.  She is open to trying medication she has tried in the past Zoloft, Prozac, Paxil and Effexor.  She wants to stay away from Wellbutrin because of potential seizure risk.  I do have a copy of her [employer] testing and according to that she should be able to take Cymbalta so we will try that and start with a low-dose.      Relevant Medications   DULoxetine (CYMBALTA) 20 MG capsule   Other Visit Diagnoses       Epigastric pain    -  Primary     Acute bacterial sinusitis       Relevant Medications   levofloxacin (LEVAQUIN) 250 MG tablet      Sinusitis-continue with saline rinses she has been doing those pretty diligently and not getting significant relief.  Will go ahead and prescribe low-dose Levaquin which she has been able to tolerate previously even though she does not feel the best on it but she is very limited in her current choices.  She can take clindamycin but that is not even for second or third line  for treatment for sinusitis.  We also discussed the possibility of just watching and waiting.  Looks like previous referral was placed for [facility].  Will give them a call back we have placed a new referral in February after she had switched insurance companies just to make sure that they could get her scheduled.  But it does not sound like she is actively seeing someone so we will give them a call today and see what is going on.  No orders of the defined types were placed in this encounter.   Meds ordered this encounter  Medications   levofloxacin (LEVAQUIN) 250 MG tablet    Sig: Take 1 tablet (250 mg total) by mouth daily.    Dispense:  5 tablet    Refill:  0   DULoxetine  (CYMBALTA) 20 MG capsule    Sig: Take 1 capsule (20 mg total) by mouth daily.    Dispense:  30 capsule    Refill:  1     I discussed the assessment and treatment plan with the patient. The patient was provided an opportunity to ask questions and all were answered. The patient agreed with the plan and demonstrated an understanding of the instructions.   The patient was advised to call back or seek an in-person evaluation if the symptoms worsen or if the condition fails to improve as anticipated.   Nani Gasser, MD

## 2023-11-13 ENCOUNTER — Ambulatory Visit (INDEPENDENT_AMBULATORY_CARE_PROVIDER_SITE_OTHER): Admitting: Psychology

## 2023-11-13 ENCOUNTER — Telehealth: Payer: Medicare HMO | Admitting: Family Medicine

## 2023-11-13 ENCOUNTER — Encounter: Payer: Self-pay | Admitting: Family Medicine

## 2023-11-13 DIAGNOSIS — R1011 Right upper quadrant pain: Secondary | ICD-10-CM | POA: Diagnosis not present

## 2023-11-13 DIAGNOSIS — F411 Generalized anxiety disorder: Secondary | ICD-10-CM | POA: Diagnosis not present

## 2023-11-13 DIAGNOSIS — F339 Major depressive disorder, recurrent, unspecified: Secondary | ICD-10-CM | POA: Diagnosis not present

## 2023-11-13 DIAGNOSIS — M542 Cervicalgia: Secondary | ICD-10-CM | POA: Diagnosis not present

## 2023-11-13 DIAGNOSIS — I10 Essential (primary) hypertension: Secondary | ICD-10-CM | POA: Diagnosis not present

## 2023-11-13 DIAGNOSIS — R079 Chest pain, unspecified: Secondary | ICD-10-CM | POA: Diagnosis not present

## 2023-11-13 DIAGNOSIS — R739 Hyperglycemia, unspecified: Secondary | ICD-10-CM | POA: Diagnosis not present

## 2023-11-13 DIAGNOSIS — Z79899 Other long term (current) drug therapy: Secondary | ICD-10-CM | POA: Diagnosis not present

## 2023-11-13 LAB — LAB REPORT - SCANNED: EGFR: 84

## 2023-11-13 MED ORDER — DULOXETINE HCL 20 MG PO CPEP: 20.0000 mg | ORAL_CAPSULE | Freq: Every day | ORAL | 1 refills | Status: DC
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
## 2023-11-13 NOTE — Assessment & Plan Note (Signed)
 Struggling to get in with a regular counselor she has someone she is working with pro bono but they are very limited in their accessibility but she is still able to access her at least once a month.  Will work on reaching back out to [employer] according to the referral that we had sent recently it said she was still an active patient so did not need a new referral so we will give her their phone number and have her call to get scheduled.  She is open to trying medication she has tried in the past Zoloft, Prozac, Paxil and Effexor.  She wants to stay away from Wellbutrin because of potential seizure risk.  I do have a copy of her [facility] testing and according to that she should be able to take Cymbalta so we will try that and start with a low-dose.

## 2023-11-14 ENCOUNTER — Telehealth: Payer: Self-pay | Admitting: Family Medicine

## 2023-11-14 DIAGNOSIS — R079 Chest pain, unspecified: Secondary | ICD-10-CM | POA: Diagnosis not present

## 2023-11-14 MED ORDER — DOXYCYCLINE HYCLATE 50 MG PO CAPS: 50.0000 mg | ORAL_CAPSULE | Freq: Two times a day (BID) | ORAL | 0 refills | Status: DC

## 2023-11-14 NOTE — Telephone Encounter (Signed)
 Spoke with Joselyn Glassman - informed as provider had noted. He will get medication ready for patient pickup.

## 2023-11-14 NOTE — Telephone Encounter (Signed)
 Patient called in stating the antibiotic prescribed, levaquin to her causes Q-waves per the pharmacist and she is afraid of that because she has had them before. She is wanting an alternative outside of the penicillin family. She is willing to take the lowest dose of doxycycline possible so it doesn't upset her stomach.  She also states that she does not want the Cymbalta since it is something that she has build up in her system, she just want something to help her get through the day.  Please Advise, thank you :)

## 2023-11-14 NOTE — Telephone Encounter (Signed)
 Meds ordered this encounter  Medications   doxycycline (VIBRAMYCIN) 50 MG capsule    Sig: Take 1 capsule (50 mg total) by mouth 2 (two) times daily.    Dispense:  14 capsule    Refill:  0    CO2 is fine.

## 2023-11-14 NOTE — Telephone Encounter (Signed)
 Spoke with Ms. Jennifer [name], scheduled 4 appts 11/26/23, 12/10/23, 12/24/23 and 01/10/24. Pt would like Rx for antibiotics sent to [facility] on [address]. Pt is concerned about her lab results done at [facility] yesterday.  Ms. Jennifer [name] states that her CO2 is normally mid 47s. Please advise.

## 2023-11-15 NOTE — Telephone Encounter (Signed)
 Patient wanting to know if you were able to look at EKG and lab work done at [facility] by PA States the EKG shows infarct in result. And wanting to know whether she needed to schedule a visit with Dr. Linford Arnold regarding this  Has not been feeling good.

## 2023-11-15 NOTE — Telephone Encounter (Signed)
 Sometimes the interpretation on the EKG will call for an infarct.  That does not mean the really has been 1.  There are usually pretty good it comparing it to the old one to know.  If they truly felt like she was having a heart attack they would have kept her.

## 2023-11-16 NOTE — Telephone Encounter (Signed)
 Patient informed.

## 2023-11-17 DIAGNOSIS — N281 Cyst of kidney, acquired: Secondary | ICD-10-CM | POA: Diagnosis not present

## 2023-11-17 DIAGNOSIS — R0789 Other chest pain: Secondary | ICD-10-CM | POA: Diagnosis not present

## 2023-11-17 DIAGNOSIS — K5730 Diverticulosis of large intestine without perforation or abscess without bleeding: Secondary | ICD-10-CM | POA: Diagnosis not present

## 2023-11-17 DIAGNOSIS — R1084 Generalized abdominal pain: Secondary | ICD-10-CM | POA: Diagnosis not present

## 2023-11-17 DIAGNOSIS — R1032 Left lower quadrant pain: Secondary | ICD-10-CM | POA: Diagnosis not present

## 2023-11-17 DIAGNOSIS — M542 Cervicalgia: Secondary | ICD-10-CM | POA: Diagnosis not present

## 2023-11-17 DIAGNOSIS — K760 Fatty (change of) liver, not elsewhere classified: Secondary | ICD-10-CM | POA: Diagnosis not present

## 2023-11-17 DIAGNOSIS — I700 Atherosclerosis of aorta: Secondary | ICD-10-CM | POA: Diagnosis not present

## 2023-11-17 DIAGNOSIS — R1013 Epigastric pain: Secondary | ICD-10-CM | POA: Diagnosis not present

## 2023-11-19 ENCOUNTER — Telehealth: Payer: Self-pay | Admitting: Family Medicine

## 2023-11-19 ENCOUNTER — Encounter: Payer: Self-pay | Admitting: Family Medicine

## 2023-11-19 NOTE — Telephone Encounter (Signed)
 Copied from CRM [id number]. Topic: Referral - Question >> Nov 19, 2023 11:18 AM Tiffany H wrote: Patient called to advise that no one from Endocrinology called her to schedule. Patient would like to stipulate that she would like to go to [facility] location. Other locations are inconvenient for her. Please assist and follow up on referral.  Patient also requested an acute care visit. Patient declined to provide details. She advised that she'd been to the ED a few days ago for pain. Unable to schedule an acute care visit without specificity of detail, so a Hospital Follow Up Appointment was scheduled. Patient would like to have her appointment scheduled for tomorrow at 11:30AM extended from 20 to 40 minutes.   Please follow up with patient.

## 2023-11-19 NOTE — Progress Notes (Signed)
 Call patient: Urine culture came back negative from [facility].

## 2023-11-20 ENCOUNTER — Telehealth (INDEPENDENT_AMBULATORY_CARE_PROVIDER_SITE_OTHER): Admitting: Family Medicine

## 2023-11-20 ENCOUNTER — Encounter: Payer: Self-pay | Admitting: Family Medicine

## 2023-11-20 DIAGNOSIS — R1013 Epigastric pain: Secondary | ICD-10-CM

## 2023-11-20 DIAGNOSIS — R140 Abdominal distension (gaseous): Secondary | ICD-10-CM | POA: Diagnosis not present

## 2023-11-20 DIAGNOSIS — R110 Nausea: Secondary | ICD-10-CM | POA: Diagnosis not present

## 2023-11-20 DIAGNOSIS — R7989 Other specified abnormal findings of blood chemistry: Secondary | ICD-10-CM | POA: Insufficient documentation

## 2023-11-20 NOTE — Progress Notes (Unsigned)
 Pt was seen at ED on 11/17/23 for Abdominal pain. She feels like she has to vomit.

## 2023-11-20 NOTE — Progress Notes (Unsigned)
 Virtual Visit via Video Note  I connected with Jennifer [name] on 11/21/23 at 11:30 AM EDT by a video enabled telemedicine application and verified that I am speaking with the correct person using two identifiers.   I discussed the limitations of evaluation and management by telemedicine and the availability of in person appointments. The patient expressed understanding and agreed to proceed.  Patient location: at home Provider location: in office  Subjective:    CC:  No chief complaint on file.   HPI: Epigastric pain and swelling.  Went to ED  at [facility] for sxs on 11/17/23.Pain in her rectum and having more flat stools again. Feels like not passing a lot of stool  CT was negative.  Taking dulcolax and then took fleets last night.  But feels more sick and nauseated.  Using hydrocortisone on rectal area.  + nausea.   Zofran causes constipation so not taking.  Tried gaviscon - helped some.  Haering some gurgling.     Needs new [facility] referral[facility]   Teeth hurting yesterday.    Knee surgery put off.     Past medical history, Surgical history, Family history not pertinant except as noted below, Social history, Allergies, and medications have been entered into the medical record, reviewed, and corrections made.    Objective:    General: Speaking clearly in complete sentences without any shortness of breath.  Alert and oriented x3.  Normal judgment. No apparent acute distress.    Impression and Recommendations:    Problem List Items Addressed This Visit       Other   Low serum renin   Other Visit Diagnoses       Epigastric pain    -  Primary   Relevant Orders   Ambulatory referral to Gastroenterology     Nausea         Bloating           She does have epigastric pain nausea and bloating.  She reports that recently she had been eating more than usual.  Stools have changed as well I do think that part of this could be related to constipation as well as  increased gas and bloating.  I want her to increase her Dulcolax to twice a day, take the Gaviscon which can help with reflux.  She did get a little relief when she took it the other night.  As well as the Gas-X to help reduce the bloating and see if maybe by this evening she is feeling a little better.  I want her continue with the Dulcolax twice a day until she has a soft bowel movement CT scan was reassuring in regards to no acute or worrisome, or life-threatening problems.  Orders Placed This Encounter  Procedures   Ambulatory referral to Gastroenterology    Referral Priority:   Routine    Referral Type:   Consultation    Referral Reason:   Specialty Services Required    Number of Visits Requested:   1    No orders of the defined types were placed in this encounter.   I discussed the assessment and treatment plan with the patient. The patient was provided an opportunity to ask questions and all were answered. The patient agreed with the plan and demonstrated an understanding of the instructions.   The patient was advised to call back or seek an in-person evaluation if the symptoms worsen or if the condition fails to improve as anticipated.  I  spent 30 minutes on the day of the encounter to include pre-visit record review, face-to-face time with the patient and post visit ordering of test.  Nani Gasser, MD

## 2023-11-20 NOTE — Telephone Encounter (Signed)
 This has been addressed.

## 2023-11-21 NOTE — Telephone Encounter (Signed)
 Left message on patients voicemail 11/19/23 advising that Dr. Shelah Lewandowsky schedule was extremely full and we were unable to extend the appointment time scheduled 11/20/23 to 40 minutes.

## 2023-11-22 ENCOUNTER — Ambulatory Visit: Admitting: Psychology

## 2023-11-22 DIAGNOSIS — F411 Generalized anxiety disorder: Secondary | ICD-10-CM | POA: Diagnosis not present

## 2023-11-22 DIAGNOSIS — F339 Major depressive disorder, recurrent, unspecified: Secondary | ICD-10-CM | POA: Diagnosis not present

## 2023-11-22 DIAGNOSIS — K5900 Constipation, unspecified: Secondary | ICD-10-CM | POA: Diagnosis not present

## 2023-11-22 DIAGNOSIS — R9431 Abnormal electrocardiogram [ECG] [EKG]: Secondary | ICD-10-CM | POA: Diagnosis not present

## 2023-11-22 DIAGNOSIS — I10 Essential (primary) hypertension: Secondary | ICD-10-CM | POA: Diagnosis not present

## 2023-11-22 DIAGNOSIS — Z87891 Personal history of nicotine dependence: Secondary | ICD-10-CM | POA: Diagnosis not present

## 2023-11-22 DIAGNOSIS — Z20822 Contact with and (suspected) exposure to covid-19: Secondary | ICD-10-CM | POA: Diagnosis not present

## 2023-11-22 DIAGNOSIS — R1084 Generalized abdominal pain: Secondary | ICD-10-CM | POA: Diagnosis not present

## 2023-11-22 NOTE — Progress Notes (Signed)
 [facility] Counselor/Therapist Progress Note  Patient ID: Jennifer [name], MRN: [id number]   Date: 11/22/23  Time Spent: 4:00 pm - 4:55 pm:  55 minutes  Treatment Type: Individual Therapy.  Reported Symptoms: Anxiety: feeling anxious, not being able to control worrying, trouble relaxing, becoming easily irritable, feeling afraid as if something awful might happen. Depression: little interest in doing things, feeling down, trouble with sleep, having little energy overeating, trouble concentrating.   Mental Status Exam: Appearance:  Casual     Behavior: Appropriate  Motor: Normal  Speech/Language:  Pressured  Affect: Appropriate  Mood: sad  Thought process: normal  Thought content:   WNL  Sensory/Perceptual disturbances:   WNL  Orientation: oriented to person, place, and time/date  Attention: Good  Concentration: Good  Memory: WNL  Fund of knowledge:  Good  Insight:   Fair  Judgment:  Fair  Impulse Control: Good   Risk Assessment: Danger to Self:  No Self-injurious Behavior: No Danger to Others: No Duty to Warn:no Physical Aggression / Violence:No  Access to Firearms a concern: No  Gang Involvement:No   Subjective:   Jennifer [name] participated from home, via video and consented to treatment. Therapist participated from home office. I discussed the limitations of evaluation and management by telemedicine and the availability of in person appointments. The patient expressed understanding and agreed to proceed. Jennifer [name] reviewed the events of the past week.   Patient was tired during our session because she had spent time at the ED last night. Patient felt like she was not listened to, nor paid attention to, at the ED. Patient reported a long history of not being listened to, and not paid attention to, by many others.     We reviewed numerous treatment approaches including CBT, BA, Problem Solving, and Solution focused therapy.   Psych-education regarding  the Jennifer [name] diagnosis of Generalized Anxiety Disorder (GAD) 41.1 MDD (major depressive disorder), recurrent episode (HCC) 33.9 was provided during the session.   We discussed Jennifer [name]'s goals treatment goals which include:  1. Manage her anger 2. Investigate her depression symptoms      (Patient referenced celebrities who have grown from their depression) 3. Return to doing crafts (rock painting, etc.)  Jennifer [name] provided verbal approval of the treatment plan.   Interventions: Psycho-education & Goal Setting.   Diagnosis:  Generalized Anxiety Disorder (GAD), Major depression, recurrent, chronic (HCC) [F33.9]   Psychiatric Treatment: Yes , via PCP  Treatment Plan:  Client Abilities/Strengths Jennifer [name] is verbal, engaging, and a self advocate.  Support System: Spouse, "Jennifer [name] is the only one who really supports me". Jennifer [name] but she died in 12/18/2017. Jennifer [name] was the first one I told that I had cancer.   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 Depression: little interest in doing things, feeling down, trouble with sleep, having little energy overeating, trouble concentrating.  Goals:   Jennifer [name] experiences symptoms of depression and anxiety.  Treatment plan signed and available on s-drive:  Yes    Target Date: 11/12/24 Frequency: Weekly/Biweekly  Progress: 0 Modality: individual    Therapist will provide referrals for additional resources as appropriate.  Therapist will provide psycho-education regarding Jennifer [name] diagnosis and corresponding treatment approaches and  interventions. Jennifer [name] will support the patient's ability to achieve the goals identified. will employ CBT, BA, Problem-solving, Solution Focused, Mindfulness,  coping skills, & other evidenced-based practices will be used to promote progress towards healthy functioning to help manage decrease symptoms associated with their diagnosis.   Reduce overall level, frequency, and intensity of the feelings of depression, anxiety and panic evidenced by decreased overall symptoms from 6 to 7 days/week to 0 to 1 days/week per client report for at least 3 consecutive months. Verbally express understanding of the relationship between feelings of depression and anxiety and their impact on thinking patterns and behaviors. Verbalize an understanding of the role that distorted thinking plays in creating fears, excessive worry, and ruminations.    Jennifer [name] participated in the creation of the treatment plan)    Jennifer [name]

## 2023-11-26 ENCOUNTER — Other Ambulatory Visit (HOSPITAL_COMMUNITY)
Admission: RE | Admit: 2023-11-26 | Discharge: 2023-11-26 | Disposition: A | Discharge status: Home / Self Care | Source: Ambulatory Visit | Attending: Family Medicine | Admitting: Family Medicine

## 2023-11-26 ENCOUNTER — Ambulatory Visit (INDEPENDENT_AMBULATORY_CARE_PROVIDER_SITE_OTHER): Admitting: Family Medicine

## 2023-11-26 ENCOUNTER — Encounter: Payer: Self-pay | Admitting: Family Medicine

## 2023-11-26 VITALS — BP 145/76 | HR 95 | Ht 62.0 in | Wt 235.0 lb

## 2023-11-26 DIAGNOSIS — R1013 Epigastric pain: Secondary | ICD-10-CM | POA: Diagnosis not present

## 2023-11-26 DIAGNOSIS — Z1151 Encounter for screening for human papillomavirus (HPV): Secondary | ICD-10-CM | POA: Diagnosis not present

## 2023-11-26 DIAGNOSIS — R195 Other fecal abnormalities: Secondary | ICD-10-CM

## 2023-11-26 DIAGNOSIS — Z124 Encounter for screening for malignant neoplasm of cervix: Secondary | ICD-10-CM

## 2023-11-26 DIAGNOSIS — R1030 Lower abdominal pain, unspecified: Secondary | ICD-10-CM | POA: Diagnosis not present

## 2023-11-26 DIAGNOSIS — Z01419 Encounter for gynecological examination (general) (routine) without abnormal findings: Secondary | ICD-10-CM | POA: Diagnosis not present

## 2023-11-26 DIAGNOSIS — R140 Abdominal distension (gaseous): Secondary | ICD-10-CM | POA: Diagnosis not present

## 2023-11-26 DIAGNOSIS — R110 Nausea: Secondary | ICD-10-CM | POA: Diagnosis not present

## 2023-11-26 NOTE — Patient Instructions (Signed)
 Keep taking your dulocox daily.

## 2023-11-26 NOTE — Progress Notes (Signed)
 Acute Office Visit  Subjective:     Patient ID: Jennifer [name], female    DOB: 1965-11-28, 58 y.o.   MRN: [id number]  No chief complaint on file.   HPI Patient is in today for lower abdominal pain.  We had placed a new GI referral last time but says she got a call form GI but it wasn't [facility] in [place]  which is a Psychologist, clinical for her.    She has been having some vaginal pain and a few days ago felt like something "gave" when she was bearing down.   No UTI sx  Has been having some more back pian with the pelvic and lower abdominal pain.    She has been taking dulcolax and did have some watery stool 3 days ago. Nothing since then. Feels really bloated, etc.  Has tp lean back and cross her legs to have a BM but even that is not working well  ROS      Objective:    BP (!) 145/76   Pulse 95   Ht 5\' 2"  (1.575 m)   Wt 235 lb (106.6 kg)   LMP 06/25/2013   SpO2 95%   BMI 42.98 kg/m    Physical Exam Vitals and nursing note reviewed. Exam conducted with a chaperone present.  Constitutional:      Appearance: Normal appearance.  HENT:     Head: Normocephalic and atraumatic.  Eyes:     Conjunctiva/sclera: Conjunctivae normal.  Cardiovascular:     Rate and Rhythm: Normal rate and regular rhythm.  Pulmonary:     Effort: Pulmonary effort is normal.     Breath sounds: Normal breath sounds.  Abdominal:     General: Bowel sounds are normal. There is no distension.     Palpations: Abdomen is soft.     Tenderness: There is abdominal tenderness. There is no guarding.     Comments: She is mildly diffusely tender but very tender in the epigastric area and left upper quadrant today  Genitourinary:    General: Normal vulva.     Exam position: Supine.     Pubic Area: No rash.      Labia:        Right: No rash, lesion or injury.        Left: No rash, lesion or injury.      Vagina: Normal.     Uterus: Absent.      Rectum: Normal. No mass or tenderness. Normal anal tone.   Skin:    General: Skin is warm and dry.  Neurological:     Mental Status: She is alert.  Psychiatric:        Mood and Affect: Mood normal.     No results found for any visits on 11/26/23.      Assessment & Plan:   Problem List Items Addressed This Visit   None Visit Diagnoses       Screening for cervical cancer    -  Primary   Relevant Orders   Ambulatory referral to Gastroenterology   Cytology - PAP     Lower abdominal pain       Relevant Orders   Ambulatory referral to Gastroenterology     Epigastric pain       Relevant Orders   Ambulatory referral to Gastroenterology     Nausea       Relevant Orders   Ambulatory referral to Gastroenterology     Bloating  Relevant Orders   Ambulatory referral to Gastroenterology     Abnormal stool caliber       Relevant Orders   Ambulatory referral to Gastroenterology      Will redo referral even though notes indicated info was faxed to [facility].    Bloating, nausea and constipation.  Cont with Dulcolax daily okay to take 2 at 1 time if needed.  Even if she is not having a bowel movement every day but at least every 3 to 4 days she is passing some stool then that should help.  At least keep her from having in a more emergency situation even though I do understand that she is quite uncomfortable.  Gas-X helps a little bit so okay to use that as well.  Early think ultimately she needs a colonoscopy.  She is also had a prior history of diverticulitis but this does not fit the typical pattern.  I did go ahead and do a pelvic exam today and did a swab.  She has had a hysterectomy.  Will check for yeast and BV as well will call with results once available.   No orders of the defined types were placed in this encounter.   No follow-ups on file.  Duaine German, MD

## 2023-11-28 ENCOUNTER — Telehealth: Payer: Self-pay | Admitting: Family Medicine

## 2023-11-28 DIAGNOSIS — R7989 Other specified abnormal findings of blood chemistry: Secondary | ICD-10-CM

## 2023-11-28 NOTE — Telephone Encounter (Signed)
 Orders Placed This Encounter  Procedures   Aldosterone + renin activity w/ ratio

## 2023-11-29 ENCOUNTER — Ambulatory Visit (INDEPENDENT_AMBULATORY_CARE_PROVIDER_SITE_OTHER): Admitting: Psychology

## 2023-11-29 ENCOUNTER — Other Ambulatory Visit: Payer: Self-pay | Admitting: *Deleted

## 2023-11-29 ENCOUNTER — Encounter: Payer: Self-pay | Admitting: Family Medicine

## 2023-11-29 DIAGNOSIS — I10 Essential (primary) hypertension: Secondary | ICD-10-CM | POA: Diagnosis not present

## 2023-11-29 DIAGNOSIS — F339 Major depressive disorder, recurrent, unspecified: Secondary | ICD-10-CM | POA: Diagnosis not present

## 2023-11-29 DIAGNOSIS — F411 Generalized anxiety disorder: Secondary | ICD-10-CM | POA: Diagnosis not present

## 2023-11-29 LAB — CYTOLOGY - PAP
Comment: NEGATIVE
Diagnosis: NEGATIVE
High risk HPV: NEGATIVE

## 2023-11-29 MED ORDER — AMBULATORY NON FORMULARY MEDICATION: 99 refills | Status: AC

## 2023-11-29 MED ORDER — AMBULATORY NON FORMULARY MEDICATION: 99 refills | Status: DC

## 2023-11-29 NOTE — Telephone Encounter (Signed)
 Order printed and faxed to [facility]

## 2023-11-29 NOTE — Progress Notes (Signed)
 Hi Jennifer [name], cells on the vaginal cuff swab were normal.  No HPV either.  Can we see if they can at least test for yeast and BV off the Pap smear

## 2023-11-29 NOTE — Progress Notes (Signed)
  [facility] Counselor/Therapist Progress Note  Patient ID: Jennifer [name], MRN: [id number]    Date: 11/29/23  Time Spent: 4:00 pm - 4:56   pm  : 56 minutes   Treatment Type: Individual Therapy.  Reported Symptoms: Anxiety: feeling anxious, not being able to control worrying, trouble relaxing, becoming easily irritable, feeling afraid as if something awful might happen. Depression: little interest in doing things, feeling down, trouble with sleep, having little energy overeating, trouble concentrating.   Mental Status Exam: Appearance:  Casual     Behavior: Appropriate  Motor: Normal  Speech/Language:  Pressured  Affect: Appropriate  Mood: sad  Thought process: normal  Thought content:   WNL  Sensory/Perceptual disturbances:   WNL  Orientation: oriented to person, place, and time/date  Attention: Good  Concentration: Good  Memory: WNL  Fund of knowledge:  Good  Insight:   Good  Judgment:  Good  Impulse Control: Good   Risk Assessment: Danger to Self:  No Self-injurious Behavior: No Danger to Others: No Duty to Warn:no Physical Aggression / Violence:No  Access to Firearms a concern: No  Gang Involvement:No   Subjective:   Jennifer [name] participated from home, via video, and consented to treatment. I discussed the limitations of evaluation and management by telemedicine and the availability of in person appointments. The patient expressed understanding and agreed to proceed.  Therapist participated from home office.  Jennifer [name] reviewed the events of the past week.   Patient was frustrated with being on the phone with insurance companies and she did her best to convey the seriousness of her symptoms. Patient discussed how she has dealt with her frustrations in the past and the behavioral changes she is making to deal with her furstrations more effectively in the future.   Interventions: Cognitive Behavioral Therapy  Diagnosis:  Generalized Anxiety Disorder  (GAD), Major depression, recurrent, chronic (HCC) [F33.9]   Psychiatric Treatment: Yes , PCP  Treatment Plan:  Client Abilities/Strengths Jennifer [name] is verbal, engaging, and a self advocate   Support System: Spouse, "Jennifer [name] is the only one who really supports me". Jennifer [name] had been my best friend but she died in Jan 15, 2018. Jennifer [name] was the first one I told that I had cancer.   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 Depression: little interest in doing things, feeling down, trouble with sleep, having little energy overeating, trouble concentrating..  Goals:   Jennifer [name] experiences symptoms of depression and anxiety.   Target Date: 11/12/24 Frequency: Weekly  Progress: 0 Modality: individual    Therapist will provide referrals for additional resources as appropriate.  Therapist will provide psycho-education regarding Jennifer [name] diagnosis and corresponding treatment approaches and interventions. Jennifer [name] will support the patient's ability to achieve the goals identified. will employ CBT, BA, Problem-solving, Solution Focused, Mindfulness,  coping skills, & other evidenced-based practices will be used to promote progress towards healthy functioning to help manage decrease symptoms associated with their diagnosis.   Reduce overall level, frequency, and intensity of the feelings of depression, anxiety and panic evidenced by decreased overall symptoms from 6 to 7 days/week to 0 to 1 days/week per client report for at least 3 consecutive months. Verbally  express understanding of the relationship between feelings of depression and  anxiety and their impact on thinking patterns and behaviors. Verbalize an understanding of the role that distorted thinking plays in creating fears, excessive worry, and ruminations.  Jennifer [name] participated in the creation of the treatment plan)    Jennifer [name]

## 2023-12-01 DIAGNOSIS — M25561 Pain in right knee: Secondary | ICD-10-CM | POA: Diagnosis not present

## 2023-12-01 DIAGNOSIS — Z79899 Other long term (current) drug therapy: Secondary | ICD-10-CM | POA: Diagnosis not present

## 2023-12-01 DIAGNOSIS — E7800 Pure hypercholesterolemia, unspecified: Secondary | ICD-10-CM | POA: Diagnosis not present

## 2023-12-01 DIAGNOSIS — Z87828 Personal history of other (healed) physical injury and trauma: Secondary | ICD-10-CM | POA: Diagnosis not present

## 2023-12-01 DIAGNOSIS — G8929 Other chronic pain: Secondary | ICD-10-CM | POA: Diagnosis not present

## 2023-12-01 DIAGNOSIS — I10 Essential (primary) hypertension: Secondary | ICD-10-CM | POA: Diagnosis not present

## 2023-12-05 DIAGNOSIS — I10 Essential (primary) hypertension: Secondary | ICD-10-CM | POA: Diagnosis not present

## 2023-12-08 DIAGNOSIS — R051 Acute cough: Secondary | ICD-10-CM | POA: Diagnosis not present

## 2023-12-08 DIAGNOSIS — M25561 Pain in right knee: Secondary | ICD-10-CM | POA: Diagnosis not present

## 2023-12-08 DIAGNOSIS — Z20822 Contact with and (suspected) exposure to covid-19: Secondary | ICD-10-CM | POA: Diagnosis not present

## 2023-12-08 DIAGNOSIS — I10 Essential (primary) hypertension: Secondary | ICD-10-CM | POA: Diagnosis not present

## 2023-12-08 DIAGNOSIS — R0781 Pleurodynia: Secondary | ICD-10-CM | POA: Diagnosis not present

## 2023-12-08 DIAGNOSIS — G8929 Other chronic pain: Secondary | ICD-10-CM | POA: Diagnosis not present

## 2023-12-08 DIAGNOSIS — R059 Cough, unspecified: Secondary | ICD-10-CM | POA: Diagnosis not present

## 2023-12-08 DIAGNOSIS — R0789 Other chest pain: Secondary | ICD-10-CM | POA: Diagnosis not present

## 2023-12-08 DIAGNOSIS — R0602 Shortness of breath: Secondary | ICD-10-CM | POA: Diagnosis not present

## 2023-12-08 DIAGNOSIS — R069 Unspecified abnormalities of breathing: Secondary | ICD-10-CM | POA: Diagnosis not present

## 2023-12-08 DIAGNOSIS — S2231XA Fracture of one rib, right side, initial encounter for closed fracture: Secondary | ICD-10-CM | POA: Diagnosis not present

## 2023-12-08 DIAGNOSIS — R002 Palpitations: Secondary | ICD-10-CM | POA: Diagnosis not present

## 2023-12-08 DIAGNOSIS — R000 Tachycardia, unspecified: Secondary | ICD-10-CM | POA: Diagnosis not present

## 2023-12-10 ENCOUNTER — Telehealth: Admitting: Family Medicine

## 2023-12-10 ENCOUNTER — Encounter: Payer: Self-pay | Admitting: Family Medicine

## 2023-12-10 DIAGNOSIS — Z711 Person with feared health complaint in whom no diagnosis is made: Secondary | ICD-10-CM

## 2023-12-10 DIAGNOSIS — I10 Essential (primary) hypertension: Secondary | ICD-10-CM | POA: Diagnosis not present

## 2023-12-10 DIAGNOSIS — F331 Major depressive disorder, recurrent, moderate: Secondary | ICD-10-CM | POA: Diagnosis not present

## 2023-12-10 DIAGNOSIS — F418 Other specified anxiety disorders: Secondary | ICD-10-CM

## 2023-12-10 DIAGNOSIS — E118 Type 2 diabetes mellitus with unspecified complications: Secondary | ICD-10-CM

## 2023-12-10 NOTE — Assessment & Plan Note (Signed)
 Discussed checking a MOCA at next OV and following over time.  Mood can contribute as well.

## 2023-12-10 NOTE — Assessment & Plan Note (Signed)
 BP still not well controlled. Could be contributing to fatigue and head sensation.

## 2023-12-10 NOTE — Progress Notes (Signed)
 Virtual Visit via Video Note  I connected with Jennifer [name] on 12/10/23 at  2:40 PM EDT by a video enabled telemedicine application and verified that I am speaking with the correct person using two identifiers.   I discussed the limitations of evaluation and management by telemedicine and the availability of in person appointments. The patient expressed understanding and agreed to proceed.  Patient location: at home Provider location: in office  Subjective:    CC:  No chief complaint on file.   HPI: He was just seen in the emergency department at [facility] on April 26 for palpitations and cough.  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  Blood pressure was mildly elevated at 154/89 with a pulse of 81.  Troponin was normal negative for COVID and flu and RSV.  Chest x-ray was negative EKG reassuring.  Scheduled for arthroscopy of her knee on June 4 with Dr. Wilma Has.  [facility]. Now her other knee is really bothering her as well as her hip. Painful to sleep on her hip.    She has felt lightheaded today, hasn't had a chance to check BP today.    She has been under some stress. Hasn't started the Cymbalta .    She is concerned about her memory.  Having issues with short term memory and where she placed things.  Feels like her long term memory is fine. Ok managing finances.    DM- not started metformin .  Wearing CGM.  Using on her right arm as had bleeding on her left arm.    Some days feels lije her head is split in half and part of her head is pulling or drawing to the side.    Past medical history, Surgical history, Family history not pertinant except as noted below, Social history, Allergies, and medications have been entered into the medical record, reviewed, and corrections made.    Objective:    General: Speaking clearly in complete sentences without any shortness of breath.  Alert and  oriented x3.  Normal judgment. No apparent acute distress.    Impression and Recommendations:    Problem List Items Addressed This Visit       Cardiovascular and Mediastinum   Low-renin essential hypertension   BP still not well controlled. Could be contributing to fatigue and head sensation.         Endocrine   Diabetes mellitus type 2 with complications (HCC)   Due for A1C at next OV.  Still consider metformin .  Work on low [employer]. Due for eye exam.         Other   MDD (major depressive disorder), recurrent episode (HCC)   Decided not to start Cymbalta .  She is getting occassional therapy.        Depression with anxiety - Primary   Concern about memory   Discussed checking a MOCA at next OV and following over time.  Mood can contribute as well.        No orders of the defined types were placed in this encounter.   No orders of the defined types were placed in this encounter.    I discussed the assessment and treatment plan with the patient. The patient was provided an opportunity to ask questions and all were answered. The patient agreed with the plan and demonstrated an understanding of the instructions.   The patient was advised to call back  or seek an in-person evaluation if the symptoms worsen or if the condition fails to improve as anticipated.   I spent 40 minutes on the day of the encounter to include pre-visit record review, face-to-face time with the patient and post visit ordering of test.   Duaine German, MD

## 2023-12-10 NOTE — Assessment & Plan Note (Signed)
 Decided not to start Cymbalta .  She is getting occassional therapy.

## 2023-12-10 NOTE — Assessment & Plan Note (Signed)
 Due for A1C at next OV.  Still consider metformin .  Work on low [employer]. Due for eye exam.

## 2023-12-11 ENCOUNTER — Ambulatory Visit (INDEPENDENT_AMBULATORY_CARE_PROVIDER_SITE_OTHER): Admitting: Psychology

## 2023-12-11 DIAGNOSIS — F411 Generalized anxiety disorder: Secondary | ICD-10-CM

## 2023-12-11 DIAGNOSIS — F339 Major depressive disorder, recurrent, unspecified: Secondary | ICD-10-CM

## 2023-12-11 NOTE — Progress Notes (Signed)
 [facility] Counselor/Therapist Progress Note  Patient ID: EBELIA MARLO, MRN: [id number]    Date: 12/11/23  Time Spent: 3:00 pm - 3:55  pm : 55  minutes    Reported Symptoms:  Anxiety: feeling anxious, not being able to control worrying, trouble relaxing, becoming easily irritable, feeling afraid as if something awful might happen. Depression: little interest in doing things, feeling down, trouble with sleep, having little energy overeating, trouble concentrating.   Mental Status Exam:  Appearance:  Well Groomed     Behavior: Appropriate  Motor: Normal  Speech/Language:  Normal Rate  Affect: Appropriate  Mood: normal  Thought process: normal  Thought content:   WNL  Sensory/Perceptual disturbances:   WNL  Orientation: oriented to person, place, and time/date  Attention: Good  Concentration: Good  Memory: WNL  Fund of knowledge:  Good  Insight:   Good  Judgment:  Good  Impulse Control: Good   Risk Assessment: Danger to Self:  No Self-injurious Behavior: No Danger to Others: No Duty to Warn:no Physical Aggression / Violence:No  Access to Firearms a concern: No  Gang Involvement:No   Subjective:   Jennifer [name] participated from home, via video, and consented to treatment. I discussed the limitations of evaluation and management by telemedicine and the availability of in person appointments. The patient expressed understanding and agreed to proceed.  Therapist participated from office.  Jennifer [name] reviewed the events of the past week.   Patient was tearful as she complained about "everything coming down" on her at once. Patient felt pressured, and drained because of all she has been doing, and all the stress she has had to deal with recently. [employer] is the go-to web site that patient finds solace.   Interventions: Cognitive Behavioral Therapy  Diagnosis:  Generalized Anxiety Disorder (GAD), Major depression, recurrent, chronic (HCC) [F33.9]    Psychiatric Treatment: Yes , via PCP  Treatment Plan:  Client Abilities/Strengths Jennifer [name] is verbal, engaging, and a self advocate    Support System: Spouse, "Jennifer [name] is the only one who really supports me". Jennifer [name] had been my best friend but she died in 12/27/2017. Jennifer [name] was the first one I told that I had cancer.    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 (Status: maintained)  Depression: little interest in doing things, feeling down, trouble with sleep, having little energy overeating, trouble concentrating. (Status: maintained)  Goals:   Jennifer [name] experiences symptoms of depression and anxiety.   Target Date: 11/12/24 Frequency: Weekly/Biweekly  Progress: 0 Modality: individual    Therapist will provide referrals for additional resources as appropriate.  Therapist will provide psycho-education regarding Jennifer [name]'s diagnosis and corresponding treatment approaches and interventions. Jennifer [name] will support the patient's ability to achieve the goals identified. will employ CBT, BA, Problem-solving, Solution Focused, Mindfulness,  coping skills, & other evidenced-based practices will be used to promote progress towards healthy functioning to help manage decrease symptoms associated with their diagnosis.   Reduce overall level, frequency, and intensity of the feelings of depression, anxiety and panic evidenced by decreased overall symptoms from 6 to 7 days/week to 0 to 1 days/week per client report for at least 3 consecutive  months. Verbally express understanding of the relationship between feelings of depression and  anxiety and their impact on thinking patterns and behaviors. Verbalize an understanding of the role that distorted thinking plays in creating fears, excessive worry, and ruminations.  Jennifer [name] participated in the creation of the treatment plan)    Jennifer [name]

## 2023-12-14 DIAGNOSIS — R9431 Abnormal electrocardiogram [ECG] [EKG]: Secondary | ICD-10-CM | POA: Diagnosis not present

## 2023-12-14 DIAGNOSIS — R002 Palpitations: Secondary | ICD-10-CM | POA: Diagnosis not present

## 2023-12-14 DIAGNOSIS — R1084 Generalized abdominal pain: Secondary | ICD-10-CM | POA: Diagnosis not present

## 2023-12-14 DIAGNOSIS — R0602 Shortness of breath: Secondary | ICD-10-CM | POA: Diagnosis not present

## 2023-12-16 DIAGNOSIS — R1032 Left lower quadrant pain: Secondary | ICD-10-CM | POA: Diagnosis not present

## 2023-12-16 DIAGNOSIS — G8929 Other chronic pain: Secondary | ICD-10-CM | POA: Diagnosis not present

## 2023-12-16 DIAGNOSIS — K5900 Constipation, unspecified: Secondary | ICD-10-CM | POA: Diagnosis not present

## 2023-12-16 DIAGNOSIS — R1110 Vomiting, unspecified: Secondary | ICD-10-CM | POA: Diagnosis not present

## 2023-12-16 LAB — LAB REPORT - SCANNED

## 2023-12-17 DIAGNOSIS — K5900 Constipation, unspecified: Secondary | ICD-10-CM | POA: Diagnosis not present

## 2023-12-17 DIAGNOSIS — R1032 Left lower quadrant pain: Secondary | ICD-10-CM | POA: Diagnosis not present

## 2023-12-18 DIAGNOSIS — I10 Essential (primary) hypertension: Secondary | ICD-10-CM | POA: Diagnosis not present

## 2023-12-18 DIAGNOSIS — R1084 Generalized abdominal pain: Secondary | ICD-10-CM | POA: Diagnosis not present

## 2023-12-18 DIAGNOSIS — N200 Calculus of kidney: Secondary | ICD-10-CM | POA: Diagnosis not present

## 2023-12-18 DIAGNOSIS — N289 Disorder of kidney and ureter, unspecified: Secondary | ICD-10-CM | POA: Diagnosis not present

## 2023-12-18 DIAGNOSIS — Z87891 Personal history of nicotine dependence: Secondary | ICD-10-CM | POA: Diagnosis not present

## 2023-12-18 DIAGNOSIS — R000 Tachycardia, unspecified: Secondary | ICD-10-CM | POA: Diagnosis not present

## 2023-12-18 DIAGNOSIS — R1032 Left lower quadrant pain: Secondary | ICD-10-CM | POA: Diagnosis not present

## 2023-12-18 DIAGNOSIS — R457 State of emotional shock and stress, unspecified: Secondary | ICD-10-CM | POA: Diagnosis not present

## 2023-12-18 DIAGNOSIS — R42 Dizziness and giddiness: Secondary | ICD-10-CM | POA: Diagnosis not present

## 2023-12-18 DIAGNOSIS — R079 Chest pain, unspecified: Secondary | ICD-10-CM | POA: Diagnosis not present

## 2023-12-18 LAB — LAB REPORT - SCANNED

## 2023-12-19 DIAGNOSIS — Z6841 Body Mass Index (BMI) 40.0 and over, adult: Secondary | ICD-10-CM | POA: Diagnosis not present

## 2023-12-19 DIAGNOSIS — R600 Localized edema: Secondary | ICD-10-CM | POA: Diagnosis not present

## 2023-12-19 DIAGNOSIS — K5790 Diverticulosis of intestine, part unspecified, without perforation or abscess without bleeding: Secondary | ICD-10-CM | POA: Diagnosis not present

## 2023-12-19 DIAGNOSIS — C44590 Other specified malignant neoplasm of anal skin: Secondary | ICD-10-CM | POA: Diagnosis not present

## 2023-12-19 DIAGNOSIS — M6250 Muscle wasting and atrophy, not elsewhere classified, unspecified site: Secondary | ICD-10-CM | POA: Diagnosis not present

## 2023-12-19 DIAGNOSIS — Z8719 Personal history of other diseases of the digestive system: Secondary | ICD-10-CM | POA: Diagnosis not present

## 2023-12-20 ENCOUNTER — Telehealth: Payer: Self-pay | Admitting: Family Medicine

## 2023-12-20 NOTE — Telephone Encounter (Signed)
 Pt wants to know if MRI of pelvis/left groin can be ordered Stat? Patient is complaining of increased pain on level of 10 and nausea. Patient states she was told previous CT scans was limited.   Patient also wants to know what vaginal  swabs tests was done for?   Patient is very frustrated regarding her increased pain and also states previous ED visits were unable give a diagnosis on what is causing her pain.   If MRI is ordered, patient would like orders sent to [facility].

## 2023-12-21 ENCOUNTER — Encounter: Admitting: Psychology

## 2023-12-21 DIAGNOSIS — R1032 Left lower quadrant pain: Secondary | ICD-10-CM | POA: Diagnosis not present

## 2023-12-21 DIAGNOSIS — R002 Palpitations: Secondary | ICD-10-CM | POA: Diagnosis not present

## 2023-12-21 DIAGNOSIS — R0602 Shortness of breath: Secondary | ICD-10-CM | POA: Diagnosis not present

## 2023-12-21 DIAGNOSIS — R1084 Generalized abdominal pain: Secondary | ICD-10-CM | POA: Diagnosis not present

## 2023-12-21 DIAGNOSIS — R0789 Other chest pain: Secondary | ICD-10-CM | POA: Diagnosis not present

## 2023-12-21 DIAGNOSIS — K439 Ventral hernia without obstruction or gangrene: Secondary | ICD-10-CM | POA: Diagnosis not present

## 2023-12-21 DIAGNOSIS — Z87891 Personal history of nicotine dependence: Secondary | ICD-10-CM | POA: Diagnosis not present

## 2023-12-21 DIAGNOSIS — N281 Cyst of kidney, acquired: Secondary | ICD-10-CM | POA: Diagnosis not present

## 2023-12-21 DIAGNOSIS — K5730 Diverticulosis of large intestine without perforation or abscess without bleeding: Secondary | ICD-10-CM | POA: Diagnosis not present

## 2023-12-21 NOTE — Progress Notes (Unsigned)
                Jennifer [name]

## 2023-12-23 DIAGNOSIS — R079 Chest pain, unspecified: Secondary | ICD-10-CM | POA: Diagnosis not present

## 2023-12-23 DIAGNOSIS — R457 State of emotional shock and stress, unspecified: Secondary | ICD-10-CM | POA: Diagnosis not present

## 2023-12-23 DIAGNOSIS — I10 Essential (primary) hypertension: Secondary | ICD-10-CM | POA: Diagnosis not present

## 2023-12-23 DIAGNOSIS — M79602 Pain in left arm: Secondary | ICD-10-CM | POA: Diagnosis not present

## 2023-12-23 DIAGNOSIS — M79603 Pain in arm, unspecified: Secondary | ICD-10-CM | POA: Diagnosis not present

## 2023-12-23 DIAGNOSIS — R000 Tachycardia, unspecified: Secondary | ICD-10-CM | POA: Diagnosis not present

## 2023-12-24 ENCOUNTER — Telehealth: Admitting: Family Medicine

## 2023-12-24 DIAGNOSIS — R9431 Abnormal electrocardiogram [ECG] [EKG]: Secondary | ICD-10-CM | POA: Diagnosis not present

## 2023-12-25 DIAGNOSIS — N179 Acute kidney failure, unspecified: Secondary | ICD-10-CM | POA: Diagnosis not present

## 2023-12-25 DIAGNOSIS — R001 Bradycardia, unspecified: Secondary | ICD-10-CM | POA: Diagnosis not present

## 2023-12-25 DIAGNOSIS — J45909 Unspecified asthma, uncomplicated: Secondary | ICD-10-CM | POA: Diagnosis not present

## 2023-12-25 DIAGNOSIS — J8283 Eosinophilic asthma: Secondary | ICD-10-CM | POA: Diagnosis not present

## 2023-12-26 DIAGNOSIS — R000 Tachycardia, unspecified: Secondary | ICD-10-CM | POA: Diagnosis not present

## 2023-12-26 NOTE — Telephone Encounter (Signed)
 I am not ordering an MRI, she needs to see Ortho or sports med for this.  I do not even have enough documentation to get an MRI approved and I do not typically order these she really needs to have this done through an Ortho or sports med.  For is the swab I do not know what her question is.  Is she talking about the Pap smear?  I put notes on there in the regards to the results.  So again I am not clear on exactly what her question is

## 2023-12-27 DIAGNOSIS — R0602 Shortness of breath: Secondary | ICD-10-CM | POA: Diagnosis not present

## 2023-12-27 DIAGNOSIS — R7989 Other specified abnormal findings of blood chemistry: Secondary | ICD-10-CM | POA: Diagnosis not present

## 2023-12-27 NOTE — Telephone Encounter (Signed)
 Spoke with patient informed that MRI will not be ordered.  She states she was questioning about the pap showing yeast result When I spoke with pathology was told that the yeast could not be ran on the PAP would have  Had to have a swab sent for this testing specifically.  Patient states she is concerned about a dramatic drop in her EGFR level  She would like to know if you would like her to have repeat blood work before her scheduled appt tomorrow to discuss this with you. She  states she is still in a lot of pain in groin and hip area and will discuss this with  provider at appt tomorrow.

## 2023-12-28 ENCOUNTER — Telehealth (INDEPENDENT_AMBULATORY_CARE_PROVIDER_SITE_OTHER): Admitting: Family Medicine

## 2023-12-28 ENCOUNTER — Encounter: Payer: Self-pay | Admitting: Family Medicine

## 2023-12-28 DIAGNOSIS — M797 Fibromyalgia: Secondary | ICD-10-CM | POA: Diagnosis not present

## 2023-12-28 DIAGNOSIS — J069 Acute upper respiratory infection, unspecified: Secondary | ICD-10-CM | POA: Diagnosis not present

## 2023-12-28 DIAGNOSIS — R0602 Shortness of breath: Secondary | ICD-10-CM | POA: Diagnosis not present

## 2023-12-28 DIAGNOSIS — I10 Essential (primary) hypertension: Secondary | ICD-10-CM | POA: Diagnosis not present

## 2023-12-28 DIAGNOSIS — R7989 Other specified abnormal findings of blood chemistry: Secondary | ICD-10-CM | POA: Diagnosis not present

## 2023-12-28 NOTE — Assessment & Plan Note (Signed)
 Bp not well controlled in the ED. That may be contributing to sxs as well.

## 2023-12-28 NOTE — Progress Notes (Signed)
 LVM advising pt to stay online and that Dr. Greer Leak was running a little late.

## 2023-12-28 NOTE — Progress Notes (Signed)
 Virtual Visit via Video Note  I connected with Jennifer [name] on 12/28/23 at  8:10 AM EDT by a video enabled telemedicine application and verified that I am speaking with the correct person using two identifiers.   I discussed the limitations of evaluation and management by telemedicine and the availability of in person appointments. The patient expressed understanding and agreed to proceed.  Patient location: at home Provider location: in office  Subjective:    CC:   Chief Complaint  Patient presents with   Medical Management of Chronic Issues    HPI: SOB, cough, and nauseated. Has been vomiting after eating some x 1.5 weeks.  A lot of drainage from her sinuses. Has been using her inhaler. Using every 4 hours 2 pufs. Felt so SOB she felt like couldn't breath. Clearing mucous helps. Using her flutter valve. Felt bad after CT dye.  Bp was high in the ED.   Still having tachycardia after cleaning her house the othe day. Felt pain all over her body.  Says he legs still feels aching. Super painful.    Her EGFR went up 3 days ago. BUN was up as well. Will need to be rechecked.   Diabetes - still checking glucose with CGM.  Trying to see pattens with foot. Goes up to 250 at time. Did drop to 80 one day when at the hospital and hadn't eaten in well.   Past medical history, Surgical history, Family history not pertinant except as noted below, Social history, Allergies, and medications have been entered into the medical record, reviewed, and corrections made.    Objective:    General: Speaking clearly in complete sentences without any shortness of breath.  Alert and oriented x3.  Normal judgment. No apparent acute distress.    Impression and Recommendations:    Problem List Items Addressed This Visit       Cardiovascular and Mediastinum   Low-renin essential hypertension   Bp not well controlled in the ED. That may be contributing to sxs as well.         Other    Fibromyalgia   Sounds like a fibro flare after cleaning her house.  She is in pain all over.        Other Visit Diagnoses       Viral upper respiratory tract infection    -  Primary     Elevated serum creatinine       Relevant Orders   Basic Metabolic Panel (BMET)      Elevated Cr -plan to recheck serum creatinine did encourage her to hydrate as the BUN was elevated as well.  If it really is trending upward then we can do further workup but again just encouraged her to hydrate and have lab redrawn on Wednesday or Thursday of next week.  URI - work on moving our mucous. Using Mucinex .  Drink plenty of fluids.  Using humidifier.    Gets rest today. She hasn't slept as was in the ED last night.   Orders Placed This Encounter  Procedures   Basic Metabolic Panel (BMET)    One time only, please to keep standing orders in system.    No orders of the defined types were placed in this encounter.   I discussed the assessment and treatment plan with the patient. The patient was provided an opportunity to ask questions and all were answered. The patient agreed with the plan and demonstrated an understanding of the instructions.  The patient was advised to call back or seek an in-person evaluation if the symptoms worsen or if the condition fails to improve as anticipated.   Duaine German, MD

## 2023-12-28 NOTE — Assessment & Plan Note (Signed)
 Sounds like a fibro flare after cleaning her house.  She is in pain all over.

## 2023-12-29 DIAGNOSIS — Z87891 Personal history of nicotine dependence: Secondary | ICD-10-CM | POA: Diagnosis not present

## 2023-12-29 DIAGNOSIS — Z20822 Contact with and (suspected) exposure to covid-19: Secondary | ICD-10-CM | POA: Diagnosis not present

## 2023-12-29 DIAGNOSIS — R0602 Shortness of breath: Secondary | ICD-10-CM | POA: Diagnosis not present

## 2023-12-29 DIAGNOSIS — M79602 Pain in left arm: Secondary | ICD-10-CM | POA: Diagnosis not present

## 2023-12-29 DIAGNOSIS — R0789 Other chest pain: Secondary | ICD-10-CM | POA: Diagnosis not present

## 2023-12-29 DIAGNOSIS — Z79899 Other long term (current) drug therapy: Secondary | ICD-10-CM | POA: Diagnosis not present

## 2023-12-31 DIAGNOSIS — R0602 Shortness of breath: Secondary | ICD-10-CM | POA: Diagnosis not present

## 2023-12-31 DIAGNOSIS — R9431 Abnormal electrocardiogram [ECG] [EKG]: Secondary | ICD-10-CM | POA: Diagnosis not present

## 2023-12-31 DIAGNOSIS — R0789 Other chest pain: Secondary | ICD-10-CM | POA: Diagnosis not present

## 2023-12-31 DIAGNOSIS — R079 Chest pain, unspecified: Secondary | ICD-10-CM | POA: Diagnosis not present

## 2023-12-31 DIAGNOSIS — I10 Essential (primary) hypertension: Secondary | ICD-10-CM | POA: Diagnosis not present

## 2023-12-31 DIAGNOSIS — Z6841 Body Mass Index (BMI) 40.0 and over, adult: Secondary | ICD-10-CM | POA: Diagnosis not present

## 2023-12-31 DIAGNOSIS — R600 Localized edema: Secondary | ICD-10-CM | POA: Diagnosis not present

## 2024-01-02 ENCOUNTER — Emergency Department (HOSPITAL_BASED_OUTPATIENT_CLINIC_OR_DEPARTMENT_OTHER)

## 2024-01-02 ENCOUNTER — Other Ambulatory Visit: Payer: Self-pay

## 2024-01-02 ENCOUNTER — Emergency Department (HOSPITAL_BASED_OUTPATIENT_CLINIC_OR_DEPARTMENT_OTHER)
Admission: EM | Admit: 2024-01-02 | Discharge: 2024-01-02 | Disposition: A | Discharge status: Home / Self Care | Attending: Emergency Medicine | Admitting: Emergency Medicine

## 2024-01-02 ENCOUNTER — Encounter (HOSPITAL_BASED_OUTPATIENT_CLINIC_OR_DEPARTMENT_OTHER): Payer: Self-pay | Admitting: Emergency Medicine

## 2024-01-02 ENCOUNTER — Telehealth: Payer: Self-pay | Admitting: Family Medicine

## 2024-01-02 DIAGNOSIS — X58XXXA Exposure to other specified factors, initial encounter: Secondary | ICD-10-CM | POA: Insufficient documentation

## 2024-01-02 DIAGNOSIS — R0602 Shortness of breath: Secondary | ICD-10-CM | POA: Diagnosis not present

## 2024-01-02 DIAGNOSIS — R0789 Other chest pain: Secondary | ICD-10-CM | POA: Diagnosis not present

## 2024-01-02 DIAGNOSIS — R000 Tachycardia, unspecified: Secondary | ICD-10-CM | POA: Diagnosis not present

## 2024-01-02 DIAGNOSIS — S29011A Strain of muscle and tendon of front wall of thorax, initial encounter: Secondary | ICD-10-CM | POA: Diagnosis not present

## 2024-01-02 LAB — BASIC METABOLIC PANEL WITH GFR
Anion gap: 11 (ref 5–15)
BUN: 16 mg/dL (ref 6–20)
CO2: 23 mmol/L (ref 22–32)
Calcium: 8.8 mg/dL — ABNORMAL LOW (ref 8.9–10.3)
Chloride: 104 mmol/L (ref 98–111)
Creatinine, Ser: 0.66 mg/dL (ref 0.44–1.00)
GFR, Estimated: >60 mL/min (ref >60)
Glucose, Bld: 113 mg/dL — ABNORMAL HIGH (ref 70–99)
Potassium: 3.9 mmol/L (ref 3.5–5.1)
Sodium: 138 mmol/L (ref 135–145)

## 2024-01-02 LAB — HEPATIC FUNCTION PANEL
ALT: 17 U/L (ref 0–44)
AST: 16 U/L (ref 15–41)
Albumin: 4.1 g/dL (ref 3.5–5.0)
Alkaline Phosphatase: 84 U/L (ref 38–126)
Bilirubin, Direct: <0.1 mg/dL (ref 0.0–0.2)
Total Bilirubin: 0.2 mg/dL (ref 0.0–1.2)
Total Protein: 6.9 g/dL (ref 6.5–8.1)

## 2024-01-02 LAB — TROPONIN T, HIGH SENSITIVITY
Troponin T High Sensitivity: <15 ng/L (ref <19)
Troponin T High Sensitivity: <15 ng/L (ref <19)

## 2024-01-02 LAB — D-DIMER, QUANTITATIVE: D-Dimer, Quant: 0.52 ug{FEU}/mL — ABNORMAL HIGH (ref 0.00–0.50)

## 2024-01-02 LAB — CBC
HCT: 41.3 % (ref 36.0–46.0)
Hemoglobin: 13.6 g/dL (ref 12.0–15.0)
MCH: 29.1 pg (ref 26.0–34.0)
MCHC: 32.9 g/dL (ref 30.0–36.0)
MCV: 88.4 fL (ref 80.0–100.0)
Platelets: 187 10*3/uL (ref 150–400)
RBC: 4.67 MIL/uL (ref 3.87–5.11)
RDW: 14 % (ref 11.5–15.5)
WBC: 6.4 10*3/uL (ref 4.0–10.5)
nRBC: 0 % (ref 0.0–0.2)

## 2024-01-02 MED ORDER — LIDOCAINE 5 % EX PTCH
1.0000 | MEDICATED_PATCH | CUTANEOUS | Status: DC
  Administered 2024-01-02: 1 via TRANSDERMAL
  Filled 2024-01-02: qty 1

## 2024-01-02 NOTE — Telephone Encounter (Signed)
 Pt requests call back ASAP, She states she has been to ED and needs to be seen.would like a call back about her symptoms.

## 2024-01-02 NOTE — Telephone Encounter (Signed)
 Spoke with Jennifer [name] and she states she is in the ED and she has been sick for two weeks, she is having chest tightness and chest congestion. She was evaluated by Dr. Lisle Ridges and he recommended patient has CT.

## 2024-01-02 NOTE — Patient Instructions (Addendum)
 Asthma Continue Xopenex  2 puffs once every 4-6 hours if needed for cough or wheeze You may use Xopenex  2 puffs 5 to 15 minutes before activity to decrease cough or wheeze For asthma flare begin Qvar  40-2 puffs once a day for 1-2 weeks or until cough and wheeze free. Rinse mouth out after Discussed that tachycardia is not a common side effect with Qvar .  Inhaled corticosteroids are the common treatment for asthma and the the option that we have to use due to your allergies and side effects to different asthma medications.  If you do not wish to follow our recommendations I would consider establishing care with a another asthma specialist.  Upper airway cough syndrome Continue to avoid known triggers Continue Flonase  2 sprays in each nostril once a day if needed for stuffy nose Continue azelastine  2 sprays in each nostril up to twice a day if needed for nasal symptoms Consider saline nasal rinses as needed for nasal symptoms. 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  Follow up in 3-6 months or sooner if needed.

## 2024-01-02 NOTE — Discharge Instructions (Signed)
You have been diagnosed by your caregiver as having chest wall pain. °SEEK IMMEDIATE MEDICAL ATTENTION IF: °You develop a fever.  °Your chest pains become severe or intolerable.  °You develop new, unexplained symptoms (problems).  °You develop shortness of breath, nausea, vomiting, sweating or feel light headed.  °You develop a new cough or you cough up blood. ° °

## 2024-01-02 NOTE — ED Notes (Signed)
 Ambulated on room air HR@ rest 76_, SpO2 _99%_, RR _16_ at start. Ambulated approximately  300 feet, HR 100_, SpO2 _98-100%_, RR 22_ .   Speaking in complete sentences without any increased work of breathing.

## 2024-01-02 NOTE — ED Triage Notes (Signed)
 Pt reports SHOB, cough and "asthma attacks" x 2 wks; reports pain to bra line that radiates to her back

## 2024-01-02 NOTE — ED Provider Notes (Signed)
 [place] [facility]GENCY DEPARTMENT AT [facility] Provider Note   CSN: [id number] Arrival date & time: 01/02/24  1308     History  Chief Complaint  Patient presents with   Shortness of Breath    Jennifer [name] is a 58 y.o. femaleWho is well-known to myself and the emergency department.  She she a past medical history of Paget's disease, atrial tachycardia and is here with a complaint of progressively worsening cough wheezing and shortness of breath after recent URI.  She states she has been coughing to the point of vomiting up large plugs of mucus.  She states that she felt like she was starting to get better but last night coughed so hard that she began having pain in her right rib cage right upper quadrant of the abdomen that radiates to her back.  She is status postcholecystectomy.  She is concerned due to the pain that she is having there.  She is only able to take Xopenex  due to history of palpitations.   Shortness of Breath      Home Medications Prior to Admission medications   Medication Sig Start Date End Date Taking? Authorizing Provider  AMBULATORY NON FORMULARY MEDICATION Medication Name: Standing order for CBC/diff (monthly) x 1 year 02/27/23   Cydney Draft, MD  AMBULATORY NON FORMULARY MEDICATION Medication Name: standing order BMP(weekly) CBC,with diff(Monthly) x 1 year Fax:[phone] 11/29/23   Cydney Draft, MD  Blood Glucose Monitoring Suppl DEVI 1 each by Does not apply route in the morning, at noon, and at bedtime. May substitute to any manufacturer covered by patient's insurance.[facility] METER 11/02/23   Cydney Draft, MD  Continuous Glucose Receiver (FREESTYLE LIBRE 3 READER) DEVI 1 each by Does not apply route daily. Freestyle libre 3 plus 11/02/23   Cydney Draft, MD  Continuous Glucose Sensor (FREESTYLE LIBRE 3 SENSOR) MISC 1 each by Does not apply route every 14 (fourteen) days. Freestyle Libre 3 Plus. Place 1 sensor on  the skin every 14 days. Use to check glucose continuously 11/02/23   Cydney Draft, MD  DULoxetine  (CYMBALTA ) 20 MG capsule Take 1 capsule (20 mg total) by mouth daily. 11/13/23   Cydney Draft, MD  EPINEPHrine  (EPIPEN  2-PAK) 0.3 mg/0.3 mL IJ SOAJ injection Inject 0.3 mg into the muscle as needed for anaphylaxis. 10/09/23   Cydney Draft, MD  famotidine  (PEPCID ) 40 MG tablet Take 1 tablet (40 mg total) by mouth 2 (two) times daily. 05/04/23 08/02/24  Cydney Draft, MD  fluticasone  (FLONASE ) 50 MCG/ACT nasal spray Place 1 spray into both nostrils daily. 08/23/23   Sean Czar, MD  Glucose Blood (BLOOD GLUCOSE TEST STRIPS) STRP 1 each by In Vitro route daily as needed. May substitute to any manufacturer covered by patient's insurance. 10/30/23   Cydney Draft, MD  levalbuterol  (XOPENEX  HFA) 45 MCG/ACT inhaler Inhale 1 puff into the lungs every 4 (four) hours as needed for wheezing or shortness of breath. 08/23/23   Sean Czar, MD  metoprolol  tartrate (LOPRESSOR ) 12.5 mg TABS tablet Take 12.5 mg by mouth 2 (two) times daily.    [provider]  potassium chloride  (KLOR-CON ) 20 MEQ packet Take 20 mEq by mouth daily. 10/09/23   Cydney Draft, MD      Allergies    Azithromycin, Ciprofloxacin , Codeine, Erythromycin base, Sulfa antibiotics, Sulfasalazine, Telmisartan , Ace inhibitors, Aspirin, Atenolol , Avelox  [moxifloxacin  hcl in nacl], Beta adrenergic blockers, Buspar  [buspirone ], Butorphanol tartrate, Cetirizine, Clonidine  hcl, Cortisone,  Erythromycin, Fentanyl , Fluoxetine hcl, Ketorolac  tromethamine , Lidocaine , Lisinopril , Metoclopramide hcl, Midazolam , Montelukast, Montelukast sodium, Naproxen, Nitroglycerin , Paroxetine, Penicillins, Pravastatin , Promethazine , Promethazine  hcl, Quinolones, Serotonin reuptake inhibitors (ssris), Sertraline hcl, Stelazine [trifluoperazine], Tobramycin, Trifluoperazine hcl, Atrovent  nasal spray [ipratropium], Diltiazem ,  Iodinated contrast media, Metoprolol , Polyethylene glycol 3350 , Propoxyphene, Adhesive [tape], Butorphanol, Ceftriaxone, Iron, Metoclopramide, Metronidazole , Other, Prednisone , Prochlorperazine, Venlafaxine, and Zyrtec [cetirizine hcl]    Review of Systems   Review of Systems  Respiratory:  Positive for shortness of breath.     Physical Exam Updated Vital Signs BP (!) 159/99 (BP Location: Right Arm)   Pulse 99   Temp 98.5 F (36.9 C) (Oral)   Resp (!) 21   Ht 5\' 2"  (1.575 m)   Wt 106.6 kg   LMP 06/25/2013   SpO2 97%   BMI 42.98 kg/m  Physical Exam Vitals and nursing note reviewed.  Constitutional:      General: She is not in acute distress.    Appearance: She is well-developed. She is not diaphoretic.  HENT:     Head: Normocephalic and atraumatic.     Right Ear: External ear normal.     Left Ear: External ear normal.     Nose: Nose normal.     Mouth/Throat:     Mouth: Mucous membranes are moist.  Eyes:     General: No scleral icterus.    Conjunctiva/sclera: Conjunctivae normal.  Cardiovascular:     Rate and Rhythm: Normal rate and regular rhythm.     Heart sounds: Normal heart sounds. No murmur heard.    No friction rub. No gallop.  Pulmonary:     Effort: Pulmonary effort is normal. No respiratory distress.     Breath sounds: Normal breath sounds.  Chest:    Abdominal:     General: Bowel sounds are normal. There is no distension.     Palpations: Abdomen is soft. There is no mass.     Tenderness: There is no abdominal tenderness. There is no guarding.  Musculoskeletal:     Cervical back: Normal range of motion.  Skin:    General: Skin is warm and dry.  Neurological:     Mental Status: She is alert and oriented to person, place, and time.  Psychiatric:        Behavior: Behavior normal.    ED Results / Procedures / Treatments   Labs (all labs ordered are listed, but only abnormal results are displayed) Labs Reviewed  CBC  BASIC METABOLIC PANEL WITH GFR   D-DIMER, QUANTITATIVE  HEPATIC FUNCTION PANEL  TROPONIN T, HIGH SENSITIVITY    EKG EKG Interpretation Date/Time:  Wednesday Jan 02 2024 15:06:06 EDT Ventricular Rate:  83 PR Interval:  120 QRS Duration:  91 QT Interval:  351 QTC Calculation: 413 R Axis:   17  Text Interpretation: Sinus rhythm Borderline T abnormalities, anterior leads No significant change since last tracing Confirmed by Shawnee Dellen [phone]) on 01/03/2024 1:09:19 PM  Radiology No results found.  Procedures Procedures    Medications Ordered in ED Medications - No data to display  ED Course/ Medical Decision Making/ A&P Clinical Course as of 01/02/24 1809  Wed Jan 02, 2024  1630 D-Dimer, Quant(!): 0.52 Patient's age adjusted d-dimer makes her very low risk for PE [AH]  1736 Patient able to ambulate in the QM:VHQ4 98-100%, HR max 100. [AH]    Clinical Course User Index [AH] Ridhaan Dreibelbis, PA-C  Medical Decision Making Given the large differential diagnosis for ZAKYA HALABI, the decision making in this case is of high complexity.  After evaluating all of the data points in this case, the presentation of ZAYRA DEVITO is NOT consistent with Acute Coronary Syndrome (ACS) and/or myocardial ischemia, pulmonary embolism, aortic dissection; Borhaave's, significant arrythmia, pneumothorax, cardiac tamponade, or other emergent cardiopulmonary condition.  Further, the presentation of NELLI SWALLEY is NOT consistent with pericarditis, myocarditis, cholecystitis, pancreatitis, mediastinitis, endocarditis, new valvular disease.  Additionally, the presentation of Shanna Un [facility] NOT consistent with flail chest, cardiac contusion, ARDS, or significant intra-thoracic or intra-abdominal bleeding.  Moreover, this presentation is NOT consistent with pneumonia, sepsis, or pyelonephritis.    Strict return and follow-up precautions have been given by me personally or by  detailed written instruction given verbally by nursing staff using the teach back method to the patient/family/caregiver(s).  Data Reviewed/Counseling: I have reviewed the patient's vital signs, nursing notes, and other relevant tests/information. I had a detailed discussion regarding the historical points, exam findings, and any diagnostic results supporting the discharge diagnosis. I also discussed the need for outpatient follow-up and the need to return to the ED if symptoms worsen or if there are any questions or concerns that arise at home.    Amount and/or Complexity of Data Reviewed Labs: ordered. Decision-making details documented in ED Course. Radiology: ordered and independent interpretation performed.    Details: I personally visualized and interpreted the images using our PACS system. Acute findings include:  No acute findings  ECG/medicine tests: ordered and independent interpretation performed.    Details: Sinus rhythm rate of 83 unchanged from previous tracings  Risk Prescription drug management. Risk Details: Ambulated in the ED with normal 02 and HR           Final Clinical Impression(s) / ED Diagnoses Final diagnoses:  Muscle strain of chest wall, initial encounter  Chest wall pain    Rx / DC Orders ED Discharge Orders     None         Tama Fails, PA-C 01/04/24 1023    Mordecai Applebaum, MD 01/09/24 [phone]

## 2024-01-03 ENCOUNTER — Ambulatory Visit (INDEPENDENT_AMBULATORY_CARE_PROVIDER_SITE_OTHER): Admitting: Family

## 2024-01-03 ENCOUNTER — Encounter: Payer: Self-pay | Admitting: Family

## 2024-01-03 VITALS — BP 132/82 | HR 78 | Temp 98.3°F | Resp 22

## 2024-01-03 DIAGNOSIS — K219 Gastro-esophageal reflux disease without esophagitis: Secondary | ICD-10-CM

## 2024-01-03 DIAGNOSIS — R0789 Other chest pain: Secondary | ICD-10-CM | POA: Diagnosis not present

## 2024-01-03 DIAGNOSIS — R058 Other specified cough: Secondary | ICD-10-CM

## 2024-01-03 DIAGNOSIS — G8929 Other chronic pain: Secondary | ICD-10-CM | POA: Diagnosis not present

## 2024-01-03 DIAGNOSIS — J4530 Mild persistent asthma, uncomplicated: Secondary | ICD-10-CM

## 2024-01-03 DIAGNOSIS — R079 Chest pain, unspecified: Secondary | ICD-10-CM | POA: Diagnosis not present

## 2024-01-03 DIAGNOSIS — R0602 Shortness of breath: Secondary | ICD-10-CM | POA: Diagnosis not present

## 2024-01-03 DIAGNOSIS — R9431 Abnormal electrocardiogram [ECG] [EKG]: Secondary | ICD-10-CM | POA: Diagnosis not present

## 2024-01-03 NOTE — Addendum Note (Signed)
 Addended by: WILLIAMS, Lynde Ludwig H on: 01/03/2024 05:47 PM   Modules accepted: Orders

## 2024-01-03 NOTE — Progress Notes (Signed)
 [address] [place] [address] Dept: [phone]  FOLLOW UP NOTE  Patient ID: Jennifer [name], female    DOB: [birth year]  Age: 58 y.o. MRN: [id number] Date of Office Visit: 01/03/2024  Assessment  Chief Complaint: Follow-up (ED follow up from 01/02/2024)  HPI Jennifer [name] is a 58 year old female who presents today for follow-up from emergency room visit from Jan 02, 2024.  She was last seen on October 23, 2023 by Marinus Sic, FNP for mild intermittent asthma without complication, history of thrush, upper airway cough syndrome, GERD, and phantosmia.  She denies any new diagnosis or surgery since her last office visit, but mentions that she was supposed to have any surgery.  She canceled the knee surgery because she felt like she was going to pass out.  Asthma: She mentions that a doctor told her it sounds like she has been having bronchial spasms.  When it all started she was having shortness of breath and mucus that was clear in color out of 1 side of her nose.  This is the first time that she has had the mucus come out of the 1 side of the nose.  When she blew her nose that she felt lightheaded and she developed mucus in her chest.  She then began having trouble breathing in her throat and in her chest.  She stopped using Qvar  40 mcg 2 puffs once a day that she was prescribed to use during asthma flare for 1 to 2 weeks because she kept getting thrush.  She rinses her mouth after using her Qvar .  She also mentions that the doctor at the emergency room told her that Qvar  could be causing her heart rate to be elevated.  Discussed the common side effects of Qvar  and that was not one of the common side effects.  She mentions that she just recently wore a Holter monitor, but does not know the results.  She wondered if the Qvar  is what" set her heart off."  She does mention however when she used Qvar  it did help.  She mentions that she has not been using Xopenex  as much as she should.  She is not  really sure if it helps.  She reports cough, tightness in her chest sometimes, and shortness of breath.  She denies fever or chills.  She has made multiple trips to the emergency room due to breathing problems, but mentions that she cannot take steroids due to her being a diabetic.  She also mentions that she is skeptical about everything.  She did have a chest x-ray yesterday while at the emergency room shows: "No active cardiopulmonary disease."  She also had a D-dimer 0.52.  She also mentions that she saw Dr. Deborra Falter earlier this week at [facility] and was worried about her having a PE.  She reports that she has a Building services engineer of a written letter written by their office stating that she needed to be checked for pulmonary embolism.  Upper airway cough syndrome: She reports rhinorrhea that occurred 1 time while she was in the shower.  She also reports nasal congestion and postnasal drip.  When she was sick she reports that the postnasal drip was thick, but now it has lessened up and is not as thick.  She does use Flonase  nasal spray daily, azelastine  nasal spray daily and Mucinex  as needed.  She has not been treated for any sinus infections since we last saw her.  Reflux: She reports that this  has been fine.  She is not having any burning or sensation of something coming up.   Drug Allergies:  Allergies  Allergen Reactions   Azithromycin Shortness Of Breath    Lip swelling, SOB.      Ciprofloxacin  Swelling    REACTION: tongue swells   Codeine Shortness Of Breath   Erythromycin Base Itching and Rash   Sulfa Antibiotics Other (See Comments), Rash and Shortness Of Breath   Sulfasalazine Rash and Shortness Of Breath    Other reaction(s): Other (See Comments) Other reaction(s): SHORTNESS OF BREATH   Telmisartan  Swelling    Tongue swelling, Micardis    Ace Inhibitors Cough   Aspirin Hives and Other (See Comments)    flushing   Atenolol  Other (See Comments)    Squeezing chest sensation   Avelox   [Moxifloxacin  Hcl In Nacl] Itching        Beta Adrenergic Blockers Other (See Comments)    Feels like chest tightening labetalol , bystolic   Feels like chest tightening "Metoprolol "    Buspar  [Buspirone ] Other (See Comments)    Light headed   Butorphanol Tartrate Other (See Comments)    Patient aggitated   Cetirizine Hives and Rash        Clonidine  Hcl     REACTION: makes blood pressure high   Cortisone     Feels like she is going crazy   Erythromycin Rash   Fentanyl  Other (See Comments)    aggressive    Fluoxetine Hcl Other (See Comments)    REACTION: headaches   Ketorolac  Tromethamine      jittery   Lidocaine  Other (See Comments)    When it involves the throat,    Lisinopril  Cough   Metoclopramide Hcl Other (See Comments)    Dystonic reaction   Midazolam  Other (See Comments)    agitation Slow to wake up   Montelukast Other (See Comments)    Singulair   Montelukast Sodium Other (See Comments)    DOES NOT REMEMBER  Don't remember-told not to take   Naproxen Other (See Comments)    FLUSHING Pt states she took Ibuprofen  today (10/08/19)   Nitroglycerin  Other (See Comments)    Unknown   Paroxetine Other (See Comments)    REACTION: headaches   Penicillins Rash   Pravastatin  Other (See Comments)    Myalgias   Promethazine  Other (See Comments)    Dystonic reaction   Promethazine  Hcl Other (See Comments)    jittery   Quinolones Swelling and Rash   Serotonin Reuptake Inhibitors (Ssris) Other (See Comments)    Headache Effexor, prozac, zoloft,    Sertraline Hcl     REACTION: headaches   Stelazine [Trifluoperazine] Other (See Comments)    Dystonic reaction   Tobramycin Itching and Rash   Trifluoperazine Hcl     dystonic   Atrovent  Nasal Spray [Ipratropium]     Tachycardia and shaking   Diltiazem  Other (See Comments)    Chest pain   Iodinated Contrast Media     Other reaction(s): Other (See Comments) Chest heaviness/sob   Metoprolol      Other reaction(s):  OTHER  Other Reaction(s): Dizziness   Polyethylene Glycol 3350      Other reaction(s): Laryngeal Edema (ALLERGY )   Propoxyphene    Adhesive [Tape] Rash    EKG monitor patches, some tapes Blisters, rash, itching, welts.   Butorphanol Anxiety    Patient agitated   Ceftriaxone Rash    rocephin   Iron Rash    Flushing with certain IV types   Metoclopramide  Itching and Other (See Comments)    Dystonic reaction   Metronidazole  Rash   Other Rash and Other (See Comments)    Uncoded Allergy . Allergen: steriods, Other Reaction: Not Assessed Other reaction(s): Flushing (ALLERGY /intolerance), GI Upset (intolerance), Hypertension (intolerance), Increased Heart Rate (intolerance), Mental Status Changes (intolerance), Other (See Comments), Tachycardia / Palpitations  (intolerance) Hospital gowns leave a rash.    Prednisone  Anxiety and Palpitations   Prochlorperazine Anxiety    Compazine:  Dystonic reaction   Venlafaxine Anxiety   Zyrtec [Cetirizine Hcl] Rash    All over body    Review of Systems: Negative except as per HPI   Physical Exam: BP 132/82 (BP Location: Left Arm, Patient Position: Sitting, Cuff Size: Large)   Pulse 78   Temp 98.3 F (36.8 C) (Oral)   Resp (!) 22   LMP 06/25/2013   SpO2 98%    Physical Exam Constitutional:      Appearance: Normal appearance.  HENT:     Head: Normocephalic and atraumatic.     Comments: Next normal, eyes normal, ears normal, nose: Bilateral lower turbinates mildly edematous and slightly erythematous with no drainage noted    Right Ear: Tympanic membrane, ear canal and external ear normal.     Left Ear: Tympanic membrane, ear canal and external ear normal.     Mouth/Throat:     Mouth: Mucous membranes are moist.     Pharynx: Oropharynx is clear.  Eyes:     Conjunctiva/sclera: Conjunctivae normal.  Cardiovascular:     Rate and Rhythm: Regular rhythm.     Heart sounds: Normal heart sounds.  Pulmonary:     Effort: Pulmonary effort is  normal.     Breath sounds: Normal breath sounds.     Comments: Lungs clear to auscultation Musculoskeletal:     Cervical back: Neck supple.  Skin:    General: Skin is warm.  Neurological:     Mental Status: She is alert and oriented to person, place, and time.  Psychiatric:        Mood and Affect: Mood normal.        Behavior: Behavior normal.        Thought Content: Thought content normal.        Judgment: Judgment normal.     Diagnostics: FVC 2.13 L (71%), FEV1 1.78 L (75%), FEV1/FVC 0.84.  Spirometry indicates possible mild restriction.  Spirometry is consistent with previous spirometry.  Assessment and Plan: 1. Not well controlled mild persistent asthma   2. Upper airway cough syndrome   3. Gastroesophageal reflux disease, unspecified whether esophagitis present     No orders of the defined types were placed in this encounter.   Patient Instructions  Asthma Continue Xopenex  2 puffs once every 4-6 hours if needed for cough or wheeze You may use Xopenex  2 puffs 5 to 15 minutes before activity to decrease cough or wheeze For asthma flare begin Qvar  40-2 puffs once a day for 1-2 weeks or until cough and wheeze free. Rinse mouth out after Discussed that tachycardia is not a common side effect with Qvar .  Inhaled corticosteroids are the common treatment for asthma and the the option that we have to use due to your allergies and side effects to different asthma medications.  If you do not wish to follow our recommendations I would consider establishing care with a another asthma specialist.  Upper airway cough syndrome Continue to avoid known triggers Continue Flonase  2 sprays in each nostril once a day if needed  for stuffy nose Continue azelastine  2 sprays in each nostril up to twice a day if needed for nasal symptoms Consider saline nasal rinses as needed for nasal symptoms. 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  Follow up in 3-6 months or sooner if needed. Return in about 3 months (around 04/04/2024).    Thank you for the opportunity to care for this patient.  Please do not hesitate to contact me with questions.  Tinnie Forehand, FNP [facility] 

## 2024-01-04 DIAGNOSIS — R002 Palpitations: Secondary | ICD-10-CM | POA: Diagnosis not present

## 2024-01-04 DIAGNOSIS — I471 Supraventricular tachycardia, unspecified: Secondary | ICD-10-CM | POA: Diagnosis not present

## 2024-01-07 ENCOUNTER — Encounter: Payer: Self-pay | Admitting: Family Medicine

## 2024-01-08 ENCOUNTER — Encounter: Payer: Self-pay | Admitting: Family Medicine

## 2024-01-09 DIAGNOSIS — M25519 Pain in unspecified shoulder: Secondary | ICD-10-CM | POA: Diagnosis not present

## 2024-01-09 DIAGNOSIS — G8929 Other chronic pain: Secondary | ICD-10-CM | POA: Diagnosis not present

## 2024-01-09 DIAGNOSIS — I10 Essential (primary) hypertension: Secondary | ICD-10-CM | POA: Diagnosis not present

## 2024-01-09 DIAGNOSIS — M25512 Pain in left shoulder: Secondary | ICD-10-CM | POA: Diagnosis not present

## 2024-01-09 DIAGNOSIS — R079 Chest pain, unspecified: Secondary | ICD-10-CM | POA: Diagnosis not present

## 2024-01-09 DIAGNOSIS — R0789 Other chest pain: Secondary | ICD-10-CM | POA: Diagnosis not present

## 2024-01-09 DIAGNOSIS — M542 Cervicalgia: Secondary | ICD-10-CM | POA: Diagnosis not present

## 2024-01-10 ENCOUNTER — Telehealth: Payer: Self-pay | Admitting: Family Medicine

## 2024-01-10 ENCOUNTER — Telehealth (INDEPENDENT_AMBULATORY_CARE_PROVIDER_SITE_OTHER): Admitting: Family Medicine

## 2024-01-10 ENCOUNTER — Encounter: Payer: Self-pay | Admitting: Family Medicine

## 2024-01-10 DIAGNOSIS — C519 Malignant neoplasm of vulva, unspecified: Secondary | ICD-10-CM | POA: Diagnosis not present

## 2024-01-10 DIAGNOSIS — E1169 Type 2 diabetes mellitus with other specified complication: Secondary | ICD-10-CM | POA: Diagnosis not present

## 2024-01-10 DIAGNOSIS — F331 Major depressive disorder, recurrent, moderate: Secondary | ICD-10-CM | POA: Insufficient documentation

## 2024-01-10 DIAGNOSIS — R079 Chest pain, unspecified: Secondary | ICD-10-CM | POA: Diagnosis not present

## 2024-01-10 DIAGNOSIS — D179 Benign lipomatous neoplasm, unspecified: Secondary | ICD-10-CM | POA: Diagnosis not present

## 2024-01-10 NOTE — Assessment & Plan Note (Signed)
Due for A1C next month.

## 2024-01-10 NOTE — Assessment & Plan Note (Signed)
 Seen by [facility] in Feb.

## 2024-01-10 NOTE — Assessment & Plan Note (Signed)
 Still working with her therapist occasionally. Had to reschedule recently. She was in the ED.

## 2024-01-10 NOTE — Progress Notes (Signed)
 Virtual Visit via Video Note  I connected with Jennifer [name] on 01/10/24 at  1:20 PM EDT by a video enabled telemedicine application and verified that I am speaking with the correct person using two identifiers.   I discussed the limitations of evaluation and management by telemedicine and the availability of in person appointments. The patient expressed understanding and agreed to proceed.  Patient location: at home Provider location: in office  Subjective:    CC:  No chief complaint on file.   HPI:  Wonders if she could have Dercums disease. Has noticed about 25 palpable nodules that have been coming up over the last 3-4 years. They can be very tender at time.  Sometime super painful. Her arm and chest was really hurting, called EMS.   Went to the ED yesterday.   Has also had fatigue, easy bruising.    She feels like still having some chest congestion and mucous.  She did take some tylenol  and IBu   BP tends to cycle around 6 PM.   Still not feeling well today.    Still having episode with sweating. Planning on doing echo in about 2.5 weeks.    Had to cancel knee surgery bc of heart situation. She was really dizzy on day of pre-op.     Lab Results  Component Value Date   HGBA1C 6.6 (A) 07/20/2023    Past medical history, Surgical history, Family history not pertinant except as noted below, Social history, Allergies, and medications have been entered into the medical record, reviewed, and corrections made.    Objective:    General: Speaking clearly in complete sentences without any shortness of breath.  Alert and oriented x3.  Normal judgment. No apparent acute distress.    Impression and Recommendations:    Problem List Items Addressed This Visit       Endocrine   Type 2 diabetes mellitus with other specified complication, without long-term current use of insulin (HCC)   Due for A1C  next month.        Musculoskeletal and Integument   Paget's  disease of vulva (HCC)   Seen by [facility] in Feb.         Other   Multiple lipomas - Primary   Recommend biopsy for definitive dx.  We can recheck autoimmune labs.  Consider Dercum's disease      Relevant Orders   Ambulatory referral to General Surgery   Moderate episode of recurrent major depressive disorder (HCC)   Still working with her therapist occasionally. Had to reschedule recently. She was in the ED.        Orders Placed This Encounter  Procedures   Ambulatory referral to General Surgery    Referral Priority:   Routine    Referral Type:   Surgical    Referral Reason:   Specialty Services Required    Requested Specialty:   General Surgery    Number of Visits Requested:   1    No orders of the defined types were placed in this encounter.   I discussed the assessment and treatment plan with the patient. The patient was provided an opportunity to ask questions and all were answered. The patient agreed with the plan and demonstrated an understanding of the instructions.   The patient was advised to call back or seek an in-person evaluation if the symptoms worsen or if the condition fails to improve as anticipated.  I spent 45 minutes on the day of the  encounter to include pre-visit record review, face-to-face time with the patient and post visit ordering of test.  Duaine German, MD

## 2024-01-10 NOTE — Telephone Encounter (Signed)
 Patient is requesting her A1C labs sent to [facility]

## 2024-01-10 NOTE — Telephone Encounter (Signed)
 OK to order and fax to [facility].

## 2024-01-10 NOTE — Assessment & Plan Note (Addendum)
 Recommend biopsy for definitive dx.  We can recheck autoimmune labs.  Consider Dercum's disease

## 2024-01-10 NOTE — Telephone Encounter (Signed)
 Please send referral for Gen Surgery to: [facility] in [facility] at P [phone] and F [phone]

## 2024-01-11 ENCOUNTER — Encounter: Payer: Self-pay | Admitting: Family Medicine

## 2024-01-11 DIAGNOSIS — I10 Essential (primary) hypertension: Secondary | ICD-10-CM | POA: Diagnosis not present

## 2024-01-11 MED ORDER — ONETOUCH DELICA PLUS LANCET30G MISC: 99 refills | Status: DC

## 2024-01-11 MED ORDER — ONETOUCH VERIO VI STRP: ORAL_STRIP | 99 refills | Status: DC

## 2024-01-13 DIAGNOSIS — R000 Tachycardia, unspecified: Secondary | ICD-10-CM | POA: Diagnosis not present

## 2024-01-15 DIAGNOSIS — M7989 Other specified soft tissue disorders: Secondary | ICD-10-CM | POA: Diagnosis not present

## 2024-01-15 DIAGNOSIS — I10 Essential (primary) hypertension: Secondary | ICD-10-CM | POA: Diagnosis not present

## 2024-01-15 DIAGNOSIS — E1169 Type 2 diabetes mellitus with other specified complication: Secondary | ICD-10-CM | POA: Diagnosis not present

## 2024-01-15 NOTE — Addendum Note (Signed)
 Addended by: Izora Marten on: 01/15/2024 01:48 PM   Modules accepted: Orders

## 2024-01-15 NOTE — Telephone Encounter (Signed)
 Order placed and faxed to: [phone]

## 2024-01-16 DIAGNOSIS — R739 Hyperglycemia, unspecified: Secondary | ICD-10-CM | POA: Diagnosis not present

## 2024-01-16 DIAGNOSIS — I10 Essential (primary) hypertension: Secondary | ICD-10-CM | POA: Diagnosis not present

## 2024-01-16 DIAGNOSIS — R110 Nausea: Secondary | ICD-10-CM | POA: Diagnosis not present

## 2024-01-16 DIAGNOSIS — R9431 Abnormal electrocardiogram [ECG] [EKG]: Secondary | ICD-10-CM | POA: Diagnosis not present

## 2024-01-16 DIAGNOSIS — E1165 Type 2 diabetes mellitus with hyperglycemia: Secondary | ICD-10-CM | POA: Diagnosis not present

## 2024-01-16 DIAGNOSIS — R1111 Vomiting without nausea: Secondary | ICD-10-CM | POA: Diagnosis not present

## 2024-01-16 DIAGNOSIS — R079 Chest pain, unspecified: Secondary | ICD-10-CM | POA: Diagnosis not present

## 2024-01-16 LAB — LAB REPORT - SCANNED: EGFR: 77

## 2024-01-17 DIAGNOSIS — R079 Chest pain, unspecified: Secondary | ICD-10-CM | POA: Diagnosis not present

## 2024-01-17 LAB — HEMOGLOBIN A1C: A1c: 6.9

## 2024-01-18 ENCOUNTER — Emergency Department (HOSPITAL_BASED_OUTPATIENT_CLINIC_OR_DEPARTMENT_OTHER)
Admission: EM | Admit: 2024-01-18 | Discharge: 2024-01-18 | Disposition: A | Discharge status: Home / Self Care | Attending: Emergency Medicine | Admitting: Emergency Medicine

## 2024-01-18 ENCOUNTER — Other Ambulatory Visit: Payer: Self-pay | Admitting: Family Medicine

## 2024-01-18 ENCOUNTER — Encounter (HOSPITAL_BASED_OUTPATIENT_CLINIC_OR_DEPARTMENT_OTHER): Payer: Self-pay | Admitting: Emergency Medicine

## 2024-01-18 ENCOUNTER — Emergency Department (HOSPITAL_BASED_OUTPATIENT_CLINIC_OR_DEPARTMENT_OTHER)

## 2024-01-18 ENCOUNTER — Other Ambulatory Visit: Payer: Self-pay

## 2024-01-18 ENCOUNTER — Ambulatory Visit: Payer: Self-pay

## 2024-01-18 ENCOUNTER — Other Ambulatory Visit: Payer: Self-pay | Admitting: *Deleted

## 2024-01-18 DIAGNOSIS — R0602 Shortness of breath: Secondary | ICD-10-CM | POA: Diagnosis not present

## 2024-01-18 DIAGNOSIS — R1011 Right upper quadrant pain: Secondary | ICD-10-CM | POA: Diagnosis not present

## 2024-01-18 DIAGNOSIS — Z7984 Long term (current) use of oral hypoglycemic drugs: Secondary | ICD-10-CM | POA: Insufficient documentation

## 2024-01-18 DIAGNOSIS — R112 Nausea with vomiting, unspecified: Secondary | ICD-10-CM | POA: Diagnosis not present

## 2024-01-18 DIAGNOSIS — E118 Type 2 diabetes mellitus with unspecified complications: Secondary | ICD-10-CM

## 2024-01-18 DIAGNOSIS — Z79899 Other long term (current) drug therapy: Secondary | ICD-10-CM | POA: Insufficient documentation

## 2024-01-18 DIAGNOSIS — E119 Type 2 diabetes mellitus without complications: Secondary | ICD-10-CM | POA: Insufficient documentation

## 2024-01-18 DIAGNOSIS — R053 Chronic cough: Secondary | ICD-10-CM | POA: Diagnosis not present

## 2024-01-18 DIAGNOSIS — I10 Essential (primary) hypertension: Secondary | ICD-10-CM | POA: Diagnosis not present

## 2024-01-18 LAB — COMPREHENSIVE METABOLIC PANEL WITH GFR
ALT: 16 U/L (ref 0–44)
AST: 17 U/L (ref 15–41)
Albumin: 3.9 g/dL (ref 3.5–5.0)
Alkaline Phosphatase: 82 U/L (ref 38–126)
Anion gap: 11 (ref 5–15)
BUN: 10 mg/dL (ref 6–20)
CO2: 24 mmol/L (ref 22–32)
Calcium: 9 mg/dL (ref 8.9–10.3)
Chloride: 106 mmol/L (ref 98–111)
Creatinine, Ser: 0.68 mg/dL (ref 0.44–1.00)
GFR, Estimated: >60 mL/min (ref >60)
Glucose, Bld: 145 mg/dL — ABNORMAL HIGH (ref 70–99)
Potassium: 3.8 mmol/L (ref 3.5–5.1)
Sodium: 141 mmol/L (ref 135–145)
Total Bilirubin: 0.2 mg/dL (ref 0.0–1.2)
Total Protein: 6.5 g/dL (ref 6.5–8.1)

## 2024-01-18 LAB — CBC WITH DIFFERENTIAL/PLATELET
Abs Immature Granulocytes: 0.02 K/uL (ref 0.00–0.07)
Basophils Absolute: 0.1 K/uL (ref 0.0–0.1)
Basophils Relative: 1 %
Eosinophils Absolute: 0.2 K/uL (ref 0.0–0.5)
Eosinophils Relative: 3 %
HCT: 40.3 % (ref 36.0–46.0)
Hemoglobin: 13.2 g/dL (ref 12.0–15.0)
Immature Granulocytes: 0 %
Lymphocytes Relative: 23 %
Lymphs Abs: 1.3 K/uL (ref 0.7–4.0)
MCH: 28.6 pg (ref 26.0–34.0)
MCHC: 32.8 g/dL (ref 30.0–36.0)
MCV: 87.2 fL (ref 80.0–100.0)
Monocytes Absolute: 0.5 K/uL (ref 0.1–1.0)
Monocytes Relative: 9 %
Neutro Abs: 3.8 K/uL (ref 1.7–7.7)
Neutrophils Relative %: 64 %
Platelets: 183 K/uL (ref 150–400)
RBC: 4.62 MIL/uL (ref 3.87–5.11)
RDW: 13.9 % (ref 11.5–15.5)
WBC: 5.9 K/uL (ref 4.0–10.5)
nRBC: 0 % (ref 0.0–0.2)

## 2024-01-18 LAB — LIPASE, BLOOD: Lipase: 43 U/L (ref 11–51)

## 2024-01-18 LAB — TROPONIN T, HIGH SENSITIVITY: Troponin T High Sensitivity: <15 ng/L (ref <19)

## 2024-01-18 MED ORDER — METFORMIN HCL 500 MG/5ML PO SOLN: 5.0000 mL | Freq: Two times a day (BID) | ORAL | 2 refills | Status: DC

## 2024-01-18 MED ORDER — SODIUM CHLORIDE 0.9 % IN NEBU: 3.0000 mL | INHALATION_SOLUTION | RESPIRATORY_TRACT | 12 refills | Status: DC | PRN

## 2024-01-18 NOTE — ED Notes (Signed)
 Discharge paperwork reviewed entirely with patient, including follow up care. Pain was under control. The patient received instruction and coaching on their prescriptions, and all follow-up questions were answered.  Pt verbalized understanding as well as all parties involved. No questions or concerns voiced at the time of discharge. No acute distress noted. Pt was encouraged to stay adequately hydrated and eat a healthy diet.   Pt ambulated out to PVA without incident or assistance.  Pt advised they will notify their PCP immediately.  The pt was instructed to set up and/or review MyChart for their results; and was informed their Providers all have access to the information as well.

## 2024-01-18 NOTE — ED Triage Notes (Addendum)
 States feels bad  since she left the hospital and has some sob and difficulty using her inhaler  states has had nausea and vomiting pt hyperventilating at triage

## 2024-01-18 NOTE — ED Provider Notes (Signed)
 [facility] Provider Note   CSN: [id number] Arrival date & time: 01/18/24  1239     History  Chief Complaint  Patient presents with   Abdominal Cramping    Jennifer [name] is a 57 y.o. female presenting to ED with many complaints.  The patient is extremely tearful during our conversation, she says that she is chronically short of breath, she has productive phlegm, she has having cramping abdominal pain and bloating, she has nauseated and vomiting, she is having chest pain.  She is upset because she has been seen many times at [facility] and says that they dismissed her for her complaints.  She says she has multiple drug intolerances and therefore cannot tolerate many medications in the hospital and at home, takes leave albuterol  only for her wheezing, as well as Mucinex .   Per my review of external records, she has been seen multiple times at primarily [facility], most recently 2 days ago, at which time she had multitude of complaints including chest pain, with no emergent findings.  She was noted to have 37 visits to the ED since the beginning of the year.  Per my review of those medical charts, is felt that many of her complaints were chronic.  She is also had numerous CT PE studies and DVT ultrasounds performed in the past for mildly chronically elevated D-dimer.  She had a perfusion study performed 1 year ago in our medical system which was negative for PE  Patient's medical history includes hypertension, diabetes.  She has an extensive list of drug intolerances, numbering 3 on her medical records    HPI     Home Medications Prior to Admission medications   Medication Sig Start Date End Date Taking? Authorizing Provider  sodium chloride  0.9 % nebulizer solution Take 3 mLs by nebulization as needed for wheezing. 01/18/24  Yes Arvilla Birmingham, MD  AMBULATORY NON FORMULARY MEDICATION Medication Name: Standing order  for CBC/diff (monthly) x 1 year 02/27/23   Cydney Draft, MD  AMBULATORY NON FORMULARY MEDICATION Medication Name: standing order BMP(weekly) CBC,with diff(Monthly) x 1 year Fax:[phone] 11/29/23   Cydney Draft, MD  Blood Glucose Monitoring Suppl DEVI 1 each by Does not apply route in the morning, at noon, and at bedtime. May substitute to any manufacturer covered by patient's insurance.[facility] METER 11/02/23   Cydney Draft, MD  Continuous Glucose Receiver (FREESTYLE LIBRE 3 READER) DEVI 1 each by Does not apply route daily. Freestyle libre 3 plus 11/02/23   Cydney Draft, MD  Continuous Glucose Sensor (FREESTYLE LIBRE 3 SENSOR) MISC 1 each by Does not apply route every 14 (fourteen) days. Freestyle Libre 3 Plus. Place 1 sensor on the skin every 14 days. Use to check glucose continuously 11/02/23   Cydney Draft, MD  EPINEPHrine  (EPIPEN  2-PAK) 0.3 mg/0.3 mL IJ SOAJ injection Inject 0.3 mg into the muscle as needed for anaphylaxis. 10/09/23   Cydney Draft, MD  famotidine  (PEPCID ) 40 MG tablet Take 1 tablet (40 mg total) by mouth 2 (two) times daily. 05/04/23 08/02/24  Cydney Draft, MD  fluticasone  (FLONASE ) 50 MCG/ACT nasal spray Place 1 spray into both nostrils daily. 08/23/23   Sean Czar, MD  Glucose Blood (BLOOD GLUCOSE TEST STRIPS) STRP 1 each by In Vitro route daily as needed. May substitute to any manufacturer covered by patient's insurance. 10/30/23   Cydney Draft, MD  glucose blood (ONETOUCH VERIO)  test strip Check glucose up to 3 times daily. 01/11/24   Cydney Draft, MD  Lancets [facility] DELICA PLUS LANCET30G) MISC Check glucose up to 3 times daily. 01/11/24   Cydney Draft, MD  levalbuterol  (XOPENEX  HFA) 45 MCG/ACT inhaler Inhale 1 puff into the lungs every 4 (four) hours as needed for wheezing or shortness of breath. 08/23/23   Sean Czar, MD  metFORMIN  (GLUCOPHAGE ) 500 MG tablet Take 1 tablet (500 mg total)  by mouth 2 (two) times daily with a meal. 01/19/24   Cydney Draft, MD  metoprolol  tartrate (LOPRESSOR ) 12.5 mg TABS tablet Take 12.5 mg by mouth 2 (two) times daily.    [provider]  metoprolol  tartrate (LOPRESSOR ) 50 MG tablet Take 50 mg by mouth 2 (two) times daily. 12/05/23   [provider]  potassium chloride  (KLOR-CON ) 20 MEQ packet Take 20 mEq by mouth daily. 10/09/23   Cydney Draft, MD      Allergies    Azithromycin, Ciprofloxacin , Codeine, Erythromycin base, Sulfa antibiotics, Sulfasalazine, Telmisartan , Ace inhibitors, Aspirin, Atenolol , Avelox  [moxifloxacin  hcl in nacl], Beta adrenergic blockers, Buspar  [buspirone ], Butorphanol tartrate, Cetirizine, Clonidine  hcl, Cortisone, Erythromycin, Fentanyl , Fluoxetine hcl, Ketorolac  tromethamine , Lidocaine , Lisinopril , Metoclopramide hcl, Midazolam , Montelukast, Montelukast sodium, Naproxen, Nitroglycerin , Paroxetine, Penicillins, Pravastatin , Promethazine , Promethazine  hcl, Quinolones, Serotonin reuptake inhibitors (ssris), Sertraline hcl, Stelazine [trifluoperazine], Tobramycin, Trifluoperazine hcl, Atrovent  nasal spray [ipratropium], Diltiazem , Iodinated contrast media, Metoprolol , Polyethylene glycol 3350 , Propoxyphene, Adhesive [tape], Butorphanol, Ceftriaxone, Iron, Metoclopramide, Metronidazole , Other, Prednisone , Prochlorperazine, Venlafaxine, and Zyrtec [cetirizine hcl]    Review of Systems   Review of Systems  Physical Exam Updated Vital Signs BP (!) 149/63   Pulse 79   Temp 99 F (37.2 C)   Resp (!) 9   Ht 5\' 2"  (1.575 m)   Wt 105.7 kg   LMP 06/25/2013   SpO2 93%   BMI 42.62 kg/m  Physical Exam Constitutional:      General: She is not in acute distress.    Comments: Tearful  HENT:     Head: Normocephalic and atraumatic.  Eyes:     Conjunctiva/sclera: Conjunctivae normal.     Pupils: Pupils are equal, round, and reactive to light.  Cardiovascular:     Rate and Rhythm: Normal rate and  regular rhythm.  Pulmonary:     Effort: Pulmonary effort is normal. No respiratory distress.  Abdominal:     General: There is no distension.     Tenderness: There is no abdominal tenderness.  Skin:    General: Skin is warm and dry.  Neurological:     General: No focal deficit present.     Mental Status: She is alert. Mental status is at baseline.  Psychiatric:        Mood and Affect: Mood normal.        Behavior: Behavior normal.     ED Results / Procedures / Treatments   Labs (all labs ordered are listed, but only abnormal results are displayed) Labs Reviewed  COMPREHENSIVE METABOLIC PANEL WITH GFR - Abnormal; Notable for the following components:      Result Value   Glucose, Bld 145 (*)    All other components within normal limits  CBC WITH DIFFERENTIAL/PLATELET  LIPASE, BLOOD  TROPONIN T, HIGH SENSITIVITY    EKG EKG Interpretation Date/Time:  Friday January 18 2024 12:49:50 EDT Ventricular Rate:  103 PR Interval:  122 QRS Duration:  82 QT Interval:  338 QTC Calculation: 442 R Axis:   25  Text  Interpretation: Sinus tachycardia When compared with ECG of 02-Jan-2024 15:06, PREVIOUS ECG IS PRESENT Confirmed by Jerald Molly [phone]) on 01/18/2024 1:02:05 PM  Radiology DG Abd Portable 1 View Result Date: 01/18/2024 CLINICAL DATA:  Right upper quadrant abdominal pain. EXAM: PORTABLE ABDOMEN - 1 VIEW COMPARISON:  Abdominal radiograph dated 12/17/2023. FINDINGS: No bowel dilatation or evidence of obstruction. No free air or radiopaque calculi. Right upper quadrant cholecystectomy clips. No acute osseous pathology. IMPRESSION: Nonobstructive bowel gas pattern. Electronically Signed   By: Angus Bark M.D.   On: 01/18/2024 14:22   DG Chest Portable 1 View Result Date: 01/18/2024 CLINICAL DATA:  Shortness of breath. EXAM: PORTABLE CHEST 1 VIEW COMPARISON:  Chest radiograph dated 01/02/2024 FINDINGS: No focal consolidation, pleural effusion, pneumothorax. The cardiac silhouette  is within limits. No acute osseous pathology. IMPRESSION: No active disease. Electronically Signed   By: Angus Bark M.D.   On: 01/18/2024 14:18    Procedures Procedures    Medications Ordered in ED Medications - No data to display  ED Course/ Medical Decision Making/ A&P                                 Medical Decision Making Amount and/or Complexity of Data Reviewed Labs: ordered. Radiology: ordered.  Risk Prescription drug management.   Patient is presenting with multitude of complaints.  It appears that essentially all of these conditions are chronic, going on for weeks or months, including her nausea and abdominal bloating, also with shortness of breath.  I did review her external records including multiple CT images and negative perfusion study last year as well as more recent [facility] ED evaluations.  She sees multiple specialist at [facility].  She is clinically stable on arrival -I explained that we will perform screening labs ensure there is no medical emergency, and if we do not identify any medical emergencies, she will be discharged home to follow-up with her outpatient.  Unfortunately I have no options for medications to give her as she has allergic intolerance to everything.  I do not believe that she needs further radiation and CT imaging; very low suspicion for acute PE or intra-abdominal surgical emergency.  I reviewed her labs and imaging - no emergent findings  I discussed this workup with the patient.  We'll discharge home at this time.  On review of her medical complaints, it seems her most pressing concern is the amount of mucous she coughs up all the time.  We discussed nebulized saline which may help with this (she has a nebulizer at home but has never used saline), and I prescribed this for her.   No indication for antibiotics or admission at this time.  Of note, on discharge RR of 9 documented is erroneous; patient was breathing normally  at discharge and not hypopneic         Final Clinical Impression(s) / ED Diagnoses Final diagnoses:  Shortness of breath  Chronic cough    Rx / DC Orders ED Discharge Orders          Ordered    sodium chloride  0.9 % nebulizer solution  As needed        01/18/24 1440              Arvilla Birmingham, MD 01/19/24 [phone]

## 2024-01-18 NOTE — Telephone Encounter (Signed)
 Pharmacy comment: $649.22/nonformulary per insurance.

## 2024-01-18 NOTE — Telephone Encounter (Signed)
 NT attempted to contact the patient back and the pt hung up the phone after answering. Sending to CAL.      Copied From CRM [id number]. Reason for Triage: Patient called in response to disconnected call earlier. Patient demanded to speak with Triage CAL. Disconnected voluntarily after not wanting to repeat herself.

## 2024-01-19 NOTE — Telephone Encounter (Signed)
 Yikes on liquid price. Changed to tabs.

## 2024-01-22 DIAGNOSIS — I10 Essential (primary) hypertension: Secondary | ICD-10-CM | POA: Diagnosis not present

## 2024-01-22 DIAGNOSIS — R110 Nausea: Secondary | ICD-10-CM | POA: Diagnosis not present

## 2024-01-22 DIAGNOSIS — G8929 Other chronic pain: Secondary | ICD-10-CM | POA: Diagnosis not present

## 2024-01-22 DIAGNOSIS — R079 Chest pain, unspecified: Secondary | ICD-10-CM | POA: Diagnosis not present

## 2024-01-22 DIAGNOSIS — R42 Dizziness and giddiness: Secondary | ICD-10-CM | POA: Diagnosis not present

## 2024-01-22 DIAGNOSIS — R519 Headache, unspecified: Secondary | ICD-10-CM | POA: Diagnosis not present

## 2024-01-22 DIAGNOSIS — R0602 Shortness of breath: Secondary | ICD-10-CM | POA: Diagnosis not present

## 2024-01-22 DIAGNOSIS — R002 Palpitations: Secondary | ICD-10-CM | POA: Diagnosis not present

## 2024-01-22 DIAGNOSIS — R0789 Other chest pain: Secondary | ICD-10-CM | POA: Diagnosis not present

## 2024-01-23 ENCOUNTER — Telehealth: Payer: Self-pay

## 2024-01-23 DIAGNOSIS — E118 Type 2 diabetes mellitus with unspecified complications: Secondary | ICD-10-CM

## 2024-01-23 NOTE — Telephone Encounter (Signed)
 Jennifer [name] with [employer] [phone] Called on behalf of patient.  Patient very upset that metformin  solution was cancelled on 01/18/24 and tablets were  sent in on 01/19/2024 Patient is stating she can not swallow the metformin  tablets -requesting medication be changed back to the solution.

## 2024-01-23 NOTE — Telephone Encounter (Signed)
 She has never actually taken the medication some not sure what she is talking about in regards to stopping the medication she is never actually taking it.  In fact she actually should have a bottle from when this was previously prescribed.  We can move forward with the PA if she is actually going to take it but if she is not then I do not want to waste anyone's time.

## 2024-01-23 NOTE — Telephone Encounter (Signed)
 Please see prior med refill note.  We were told that the insurance would not cover the liquid.

## 2024-01-23 NOTE — Telephone Encounter (Signed)
 She states that she can not take the metformin  as tablet - she will just stop the metformin  if she has to take tablets.  States that she was told that no PA was not done for the solution- states she had gotten this in the past  as solution.  Can prescription be resent as solution and PA attempted for the solution due to her difficulty swallowing tablets?

## 2024-01-24 DIAGNOSIS — D500 Iron deficiency anemia secondary to blood loss (chronic): Secondary | ICD-10-CM | POA: Diagnosis not present

## 2024-01-24 DIAGNOSIS — E118 Type 2 diabetes mellitus with unspecified complications: Secondary | ICD-10-CM | POA: Diagnosis not present

## 2024-01-24 DIAGNOSIS — I10 Essential (primary) hypertension: Secondary | ICD-10-CM | POA: Diagnosis not present

## 2024-01-24 DIAGNOSIS — R2232 Localized swelling, mass and lump, left upper limb: Secondary | ICD-10-CM | POA: Diagnosis not present

## 2024-01-24 DIAGNOSIS — R1900 Intra-abdominal and pelvic swelling, mass and lump, unspecified site: Secondary | ICD-10-CM | POA: Diagnosis not present

## 2024-01-24 DIAGNOSIS — M7989 Other specified soft tissue disorders: Secondary | ICD-10-CM | POA: Diagnosis not present

## 2024-01-27 DIAGNOSIS — L299 Pruritus, unspecified: Secondary | ICD-10-CM | POA: Diagnosis not present

## 2024-01-28 DIAGNOSIS — R0602 Shortness of breath: Secondary | ICD-10-CM | POA: Diagnosis not present

## 2024-01-28 DIAGNOSIS — L299 Pruritus, unspecified: Secondary | ICD-10-CM | POA: Diagnosis not present

## 2024-01-28 DIAGNOSIS — R21 Rash and other nonspecific skin eruption: Secondary | ICD-10-CM | POA: Diagnosis not present

## 2024-01-28 DIAGNOSIS — R000 Tachycardia, unspecified: Secondary | ICD-10-CM | POA: Diagnosis not present

## 2024-01-28 MED ORDER — METFORMIN HCL 500 MG/5ML PO SOLN
5.0000 mL | Freq: Two times a day (BID) | ORAL | 1 refills | Status: DC
Start: 2024-01-28 — End: 2024-05-13

## 2024-01-28 NOTE — Telephone Encounter (Signed)
 Okay, we will go ahead and forward to the prior authorization team for the liquid metformin .  Meds ordered this encounter  Medications   Metformin  HCl 500 MG/5ML SOLN    Sig: Take 5 mLs (500 mg total) by mouth 2 (two) times daily with a meal.    Dispense:  300 mL    Refill:  1    cannot take metformin  orally.  Difficulty swallowing.  Are working on getting prior authorization.

## 2024-01-29 ENCOUNTER — Ambulatory Visit: Admitting: Psychology

## 2024-01-29 ENCOUNTER — Telehealth: Payer: Self-pay

## 2024-01-29 ENCOUNTER — Other Ambulatory Visit (HOSPITAL_COMMUNITY): Payer: Self-pay

## 2024-01-29 DIAGNOSIS — I517 Cardiomegaly: Secondary | ICD-10-CM | POA: Diagnosis not present

## 2024-01-29 DIAGNOSIS — I10 Essential (primary) hypertension: Secondary | ICD-10-CM | POA: Diagnosis not present

## 2024-01-29 NOTE — Telephone Encounter (Signed)
 Pharmacy Patient Advocate Encounter   Received notification from Pt Calls Messages that prior authorization for Metformin  500mg /79ml is required/requested.   Insurance verification completed.   The patient is insured through [facility] ADVANTAGE/RX ADVANCE .   Per test claim: PA required; PA submitted to above mentioned insurance via CoverMyMeds Key/confirmation #/EOC [facility] Status is pending

## 2024-01-30 ENCOUNTER — Other Ambulatory Visit (HOSPITAL_COMMUNITY): Payer: Self-pay

## 2024-01-30 DIAGNOSIS — D179 Benign lipomatous neoplasm, unspecified: Secondary | ICD-10-CM | POA: Diagnosis not present

## 2024-01-30 DIAGNOSIS — L309 Dermatitis, unspecified: Secondary | ICD-10-CM | POA: Diagnosis not present

## 2024-01-30 DIAGNOSIS — R21 Rash and other nonspecific skin eruption: Secondary | ICD-10-CM | POA: Diagnosis not present

## 2024-01-30 DIAGNOSIS — L814 Other melanin hyperpigmentation: Secondary | ICD-10-CM | POA: Diagnosis not present

## 2024-01-30 DIAGNOSIS — R0602 Shortness of breath: Secondary | ICD-10-CM | POA: Diagnosis not present

## 2024-01-31 ENCOUNTER — Encounter: Payer: Self-pay | Admitting: Family Medicine

## 2024-01-31 ENCOUNTER — Telehealth: Admitting: Family Medicine

## 2024-01-31 ENCOUNTER — Ambulatory Visit: Admitting: Internal Medicine

## 2024-01-31 DIAGNOSIS — R0789 Other chest pain: Secondary | ICD-10-CM

## 2024-01-31 DIAGNOSIS — E118 Type 2 diabetes mellitus with unspecified complications: Secondary | ICD-10-CM

## 2024-01-31 DIAGNOSIS — R21 Rash and other nonspecific skin eruption: Secondary | ICD-10-CM

## 2024-01-31 DIAGNOSIS — Z7984 Long term (current) use of oral hypoglycemic drugs: Secondary | ICD-10-CM | POA: Diagnosis not present

## 2024-01-31 NOTE — Assessment & Plan Note (Signed)
 I did go over the echocardiogram results with her that overall look really good she does have a little bit of borderline hypertrophy of the left ventricle and we discussed how having elevated blood pressure levels can contribute to that and how prolonged pressure and increase in hypertrophy of that ventricle can eventually lead to heart failure if we do not better control her blood pressure.

## 2024-01-31 NOTE — Assessment & Plan Note (Addendum)
 I did let her know that we have sent the metformin  liquid over for prior authorization since she has difficulty swallowing pills.  It think we were able to get it approved last fall she never actually started it at that time.  If she does not hear back from the pharmacy next week then please give us  a call back.  She has been getting a lot of tenderness pain and bruising with the continuous glucose monitor.  In fact she took it off yesterday.  She has noticed a lot more swelling in that right upper arm.

## 2024-01-31 NOTE — Progress Notes (Signed)
 Virtual Visit via Video Note  I connected with Jennifer [name] on 01/31/24 at  1:20 PM EDT by a video enabled telemedicine application and verified that I am speaking with the correct person using two identifiers.   I discussed the limitations of evaluation and management by telemedicine and the availability of in person appointments. The patient expressed understanding and agreed to proceed.  Patient location: at home Provider location: in office  Subjective:    CC:  No chief complaint on file.   HPI:  She was seen in the ED for rash she was tried antihistamines.  Started last Thursday. Gets welt and then scratches. Pimple size: ON face and whole body.  Feels like in throat. Rash is very itchy.  Saw dermatology and they gave her topical steroid.    Tried Claritin  and hydroxyzine .   Iron a little low and plan to recheck in 3 months.   Had an US  on bumps on her arm after surgical referral.   She would like to go over her echo results on 01/29/24.    Right upper arm swollen after wearing meter.  Last time put on meter it was really painful.                                    Transthoracic Echocardiogram Report  Name  Jennifer [name], Jennifer [name]                        Study Date  01-29-2024             Height  62 in  MRN  [id number]                                     Patient Location  [facility]        Weight  233 lb  DOB  November 15, 1965                                   Gender  Female                     BSA  2.0 m2  Age  58 yrs                                       Ethnicity  1                       BP  157-82 mmHg  Reason For Study  Palpitations                                                       HR  79  History  Palpitations  Ordering Physician  TANN, SAMANDRA NADINE         Performed By  Arie Begin  Referring Physician  TANN, SAMANDRA NADINE  -  -  PROCEDURE  A two-dimensional transthoracic echocardiogram with color flow and Doppler was performed. Image  Quality  Technically difficult. No echo contrast due to no IV access. Patient was extremely hesitant  to start IV and use contrast.  -  SUMMARY  The left ventricular size is normal. There is borderline concentric left ventricular hypertrophy.  The left ventricular wall motion is normal. Left ventricular systolic function is normal. LV  ejection fraction = 60-65%.  Normal left ventricular diastolic function and left atrial pressure.  The right ventricle is normal in size and function.  The atria are normal in size.  There is no significant valvular stenosis or regurgitation.  There was insufficient TR detected to calculate RV systolic pressure.  IVC size was normal.  The aortic sinus is normal size.  There is no significant change in comparison with the last study.  -  FINDINGS  LEFT VENTRICLE  The left ventricular size is normal. There is borderline concentric left ventricular hypertrophy.  Left ventricular systolic function is normal. LV ejection fraction = 60-65%. Normal left ventricular  diastolic function and left atrial pressure. The left ventricular wall motion is normal.  -  RIGHT VENTRICLE  The right ventricle is normal size. The right ventricle is normal in size and function. The right  ventricular systolic function is normal.  LEFT ATRIUM  The atria are normal in size. The left atrial size is normal.  RIGHT ATRIUM  Right atrial size is normal.  -  AORTIC VALVE  The aortic valve is trileaflet. There is no aortic stenosis. There is no aortic regurgitation.  -  MITRAL VALVE  The mitral valve leaflets appear normal. There is trace mitral regurgitation.  -  TRICUSPID VALVE  Structurally normal tricuspid valve. There is trace tricuspid regurgitation. There was insufficient  TR detected to calculate RV systolic pressure.  -  PULMONIC VALVE  The pulmonic valve is not well visualized. Trace pulmonic valvular regurgitation.  -  ARTERIES  The aortic sinus is normal size. The  ascending aorta is normal size.  -  VENOUS  Pulmonary venous flow pattern is normal. IVC size was normal.  -  EFFUSION  There is no pericardial effusion.  -    Past medical history, Surgical history, Family history not pertinant except as noted below, Social history, Allergies, and medications have been entered into the medical record, reviewed, and corrections made.    Objective:    General: Speaking clearly in complete sentences without any shortness of breath.  Alert and oriented x3.  Normal judgment. No apparent acute distress.    Impression and Recommendations:    Problem List Items Addressed This Visit       Endocrine   Diabetes mellitus type 2 with complications (HCC)   I did let her know that we have sent the metformin  liquid over for prior authorization since she has difficulty swallowing pills.  It think we were able to get it approved last fall she never actually started it at that time.  If she does not hear back from the pharmacy next week then please give us  a call back.  She has been getting a lot of tenderness pain and bruising with the continuous glucose monitor.  In fact she took it off yesterday.  She has noticed a lot more swelling in that right upper arm.        Other   Chest pain, atypical   I did go over the echocardiogram results with her that overall look really good she does have a little bit of borderline hypertrophy of the left ventricle and we discussed how  having elevated blood pressure levels can contribute to that and how prolonged pressure and increase in hypertrophy of that ventricle can eventually lead to heart failure if we do not better control her blood pressure.      Other Visit Diagnoses       Rash    -  Primary   Relevant Orders   Ambulatory referral to Dermatology       Orders Placed This Encounter  Procedures   Ambulatory referral to Dermatology    Referral Priority:   Routine    Referral Type:   Consultation    Referral  Reason:   Specialty Services Required    Requested Specialty:   Dermatology    Number of Visits Requested:   1    No orders of the defined types were placed in this encounter.  I spent 30 minutes on the day of the encounter to include pre-visit record review, face-to-face time with the patient and post visit ordering of test.  I discussed the assessment and treatment plan with the patient. The patient was provided an opportunity to ask questions and all were answered. The patient agreed with the plan and demonstrated an understanding of the instructions.   The patient was advised to call back or seek an in-person evaluation if the symptoms worsen or if the condition fails to improve as anticipated.   Duaine German, MD

## 2024-02-01 ENCOUNTER — Other Ambulatory Visit (HOSPITAL_COMMUNITY): Payer: Self-pay

## 2024-02-01 ENCOUNTER — Telehealth: Payer: Self-pay | Admitting: Family Medicine

## 2024-02-01 ENCOUNTER — Telehealth: Payer: Self-pay

## 2024-02-01 DIAGNOSIS — B353 Tinea pedis: Secondary | ICD-10-CM

## 2024-02-01 MED ORDER — TERBINAFINE HCL 1 % EX CREA: 1.0000 | TOPICAL_CREAM | Freq: Two times a day (BID) | CUTANEOUS | 0 refills | Status: DC

## 2024-02-01 NOTE — Telephone Encounter (Addendum)
 Pt called in stating that a medication was not sent in after her video visit yesterday. Possible foot fungus cream? Pt does not want OTC

## 2024-02-01 NOTE — Telephone Encounter (Signed)
 Meds ordered this encounter  Medications   terbinafine  (LAMISIL ) 1 % cream    Sig: Apply 1 Application topically 2 (two) times daily. X 6-8 weeks    Dispense:  30 g    Refill:  0

## 2024-02-01 NOTE — Telephone Encounter (Signed)
 Copied from CRM [id number]. Topic: Clinical - Prescription Issue >> Feb 01, 2024 11:34 AM Tisa Forester wrote: Reason for CRM: Patient had a video visit with Metheney,catherine and was  Suppose to call in a script for her feet Patient frustrated due to pharmacy do not have the script  Call [phone] >> Feb 01, 2024 11:39 AM Tisa Forester wrote: Due to system issues , got disconnected from CAL and patient ,

## 2024-02-01 NOTE — Telephone Encounter (Signed)
 Patient called back stating that she can not afford a OTC topical cream Lamisil  and if you could send an alternative that will be covered. I called the pharmacist and they stated that the Ketoconazole is normally covering by insurance. Please advise if that is something that you are willing to do - Still using the [employer] on file

## 2024-02-01 NOTE — Telephone Encounter (Signed)
 Jennifer [name],  I think that you are working on this for the patient?

## 2024-02-01 NOTE — Telephone Encounter (Signed)
 Can try cost at [facility] and see if cheaper

## 2024-02-01 NOTE — Telephone Encounter (Signed)
 Pharmacy Patient Advocate Encounter  Received notification from [employer] that Prior Authorization for Metformin  500mg /77ml has been APPROVED from 01/29/24 to 08/13/24. Ran test claim, Copay is $4.90. This test claim was processed through [facility]- copay amounts may vary at other pharmacies due to pharmacy/plan contracts, or as the patient moves through the different stages of their insurance plan.   PA #/Case ID/Reference #: [id number]  Left a message at [facility] to notify of the approval

## 2024-02-06 ENCOUNTER — Telehealth: Payer: Self-pay | Admitting: Family

## 2024-02-06 NOTE — Telephone Encounter (Signed)
 Jennifer [name] called and stated that she is having a lot of issues with itching. She states she has recently been to see a dermatologist. She states that she is interested in doing more skin testing. I offered to schedule her as she was seen in May, but she wants to know if some blood work can be ordered for her to do first. She is asking for a call back from someone on the clinical staff to discuss. Best contact [phone].

## 2024-02-07 DIAGNOSIS — E1159 Type 2 diabetes mellitus with other circulatory complications: Secondary | ICD-10-CM | POA: Diagnosis not present

## 2024-02-07 DIAGNOSIS — M79601 Pain in right arm: Secondary | ICD-10-CM | POA: Diagnosis not present

## 2024-02-07 DIAGNOSIS — Z79899 Other long term (current) drug therapy: Secondary | ICD-10-CM | POA: Diagnosis not present

## 2024-02-07 DIAGNOSIS — Z1330 Encounter for screening examination for mental health and behavioral disorders, unspecified: Secondary | ICD-10-CM | POA: Diagnosis not present

## 2024-02-07 DIAGNOSIS — M25421 Effusion, right elbow: Secondary | ICD-10-CM | POA: Diagnosis not present

## 2024-02-07 DIAGNOSIS — G35 Multiple sclerosis: Secondary | ICD-10-CM | POA: Diagnosis not present

## 2024-02-07 DIAGNOSIS — E538 Deficiency of other specified B group vitamins: Secondary | ICD-10-CM | POA: Diagnosis not present

## 2024-02-07 DIAGNOSIS — J45909 Unspecified asthma, uncomplicated: Secondary | ICD-10-CM | POA: Diagnosis not present

## 2024-02-07 DIAGNOSIS — Z87891 Personal history of nicotine dependence: Secondary | ICD-10-CM | POA: Diagnosis not present

## 2024-02-07 DIAGNOSIS — E559 Vitamin D deficiency, unspecified: Secondary | ICD-10-CM | POA: Diagnosis not present

## 2024-02-07 DIAGNOSIS — M79621 Pain in right upper arm: Secondary | ICD-10-CM | POA: Diagnosis not present

## 2024-02-07 DIAGNOSIS — E611 Iron deficiency: Secondary | ICD-10-CM | POA: Diagnosis not present

## 2024-02-07 DIAGNOSIS — I10 Essential (primary) hypertension: Secondary | ICD-10-CM | POA: Diagnosis not present

## 2024-02-07 DIAGNOSIS — K222 Esophageal obstruction: Secondary | ICD-10-CM | POA: Diagnosis not present

## 2024-02-07 DIAGNOSIS — R21 Rash and other nonspecific skin eruption: Secondary | ICD-10-CM | POA: Diagnosis not present

## 2024-02-07 DIAGNOSIS — M25562 Pain in left knee: Secondary | ICD-10-CM | POA: Diagnosis not present

## 2024-02-07 DIAGNOSIS — I152 Hypertension secondary to endocrine disorders: Secondary | ICD-10-CM | POA: Diagnosis not present

## 2024-02-07 DIAGNOSIS — Z0389 Encounter for observation for other suspected diseases and conditions ruled out: Secondary | ICD-10-CM | POA: Diagnosis not present

## 2024-02-07 DIAGNOSIS — N6310 Unspecified lump in the right breast, unspecified quadrant: Secondary | ICD-10-CM | POA: Diagnosis not present

## 2024-02-07 DIAGNOSIS — Z0000 Encounter for general adult medical examination without abnormal findings: Secondary | ICD-10-CM | POA: Diagnosis not present

## 2024-02-07 DIAGNOSIS — N649 Disorder of breast, unspecified: Secondary | ICD-10-CM | POA: Diagnosis not present

## 2024-02-07 DIAGNOSIS — L299 Pruritus, unspecified: Secondary | ICD-10-CM | POA: Diagnosis not present

## 2024-02-07 DIAGNOSIS — M25511 Pain in right shoulder: Secondary | ICD-10-CM | POA: Diagnosis not present

## 2024-02-07 NOTE — Telephone Encounter (Signed)
 Thank you for reaching out to her

## 2024-02-07 NOTE — Telephone Encounter (Signed)
 Can you please let her know she will need to be seen in the clinic. Also she has had some food allergy  testing done within the last year and insurance may not cover what we have already tested. Thank you

## 2024-02-08 LAB — LAB REPORT - SCANNED: EGFR: 90

## 2024-02-10 DIAGNOSIS — S2231XA Fracture of one rib, right side, initial encounter for closed fracture: Secondary | ICD-10-CM | POA: Diagnosis not present

## 2024-02-10 DIAGNOSIS — R000 Tachycardia, unspecified: Secondary | ICD-10-CM | POA: Diagnosis not present

## 2024-02-10 DIAGNOSIS — R21 Rash and other nonspecific skin eruption: Secondary | ICD-10-CM | POA: Diagnosis not present

## 2024-02-10 DIAGNOSIS — R0602 Shortness of breath: Secondary | ICD-10-CM | POA: Diagnosis not present

## 2024-02-12 DIAGNOSIS — C519 Malignant neoplasm of vulva, unspecified: Secondary | ICD-10-CM | POA: Diagnosis not present

## 2024-02-14 ENCOUNTER — Telehealth (INDEPENDENT_AMBULATORY_CARE_PROVIDER_SITE_OTHER): Admitting: Family Medicine

## 2024-02-14 DIAGNOSIS — L853 Xerosis cutis: Secondary | ICD-10-CM | POA: Diagnosis not present

## 2024-02-14 DIAGNOSIS — R21 Rash and other nonspecific skin eruption: Secondary | ICD-10-CM

## 2024-02-14 NOTE — Progress Notes (Signed)
 Pt has not p/u the Metformin  due to her transportation issues.  She stated that she is still itching really bad. She was seen by the Gyn/onc but nothing was done.  Pt also said that she is itching all over and has seen dermatology.and has been given several creams but these haven't helped.  She doesn't know what she could have come in contact with that could have caused this.asked if she has changed anything in her diet she said that she hasn't.

## 2024-02-14 NOTE — Telephone Encounter (Signed)
 Spoke to the patient and explained to her that Dr. Alvan wants her to get the OTC Lamisil  for her feet. Patient claimed that the prices at her pharmacy was too high , so I called [facility] on her behalf. [facility] has the medication for $8 and patient still refused to pick it up. Patient got upset and said she will just let her foot rod off before buying the foot cream. Patient wanted cream to be free and fully covered under her insurance.

## 2024-02-14 NOTE — Progress Notes (Signed)
 Virtual Visit via Video Note  I connected with Jennifer [name] on 02/18/24 at  8:50 AM EDT by a video enabled telemedicine application and verified that I am speaking with the correct person using two identifiers.   I discussed the limitations of evaluation and management by telemedicine and the availability of in person appointments. The patient expressed understanding and agreed to proceed.  Patient location: at home Provider location: in office  Subjective:    CC:   Chief Complaint  Patient presents with   Medical Management of Chronic Issues    HPI: She still struggling with her itchy maculopapular rash has been going on for about 3 weeks now she has noticed red raised bumps that are very itchy she has tried 3 different steroid creams at this point and only provide minimal relief.  She has noticed more recently that the skin around the edges of her feet are also more red and itchy noticed a little bit of irritation and itching in the groin area as well.  She is also been experiencing some more thick white mucus in her throat and wonders if that could be related to the rash and the itching and possible thrush.  She has been evaluated by dermatology who recommended the steroid creams.  They also recommended that she change to dye free perfume free products like soaps detergents etc.  She said she was diagnosed with eczema dermatitis.   She also saw gynecology oncology earlier this week for follow-up and evaluation.  She is also still having some significant financial stressors and feels like the [place] is starting to get to her she has noticed that her hair is starting to fall out.  She has been using some nasal Astelin /fluticasone  spray.  She would also like to get tested for blueberry allergies.    Past medical history, Surgical history, Family history not pertinant except as noted below, Social history, Allergies, and medications have been entered into the medical record,  reviewed, and corrections made.    Objective:    General: Speaking clearly in complete sentences without any shortness of breath.  Alert and oriented x3.  Normal judgment. No apparent acute distress.    Impression and Recommendations:    Problem List Items Addressed This Visit       Musculoskeletal and Integument   Xerosis of feet   It sounds like dermatology may want to actually treat her for tinea pedis.  It is possible she could be having an id reaction that could be causing the widespread rash.  They be very reasonable to treat.  We had recently sent over the terbinafine  cream about a week ago.      Other Visit Diagnoses       Rash    -  Primary   Relevant Orders   Allergen, Blueberry, [phone]      Rash-Following with dermatology but she would like to be tested for blueberry allergy  which I think is reasonable.  Will order that and send the order over to the [facility] lab.  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  She was very concerned that they were wanting to take take picture documentation.  I discussed that that certainly her personal decision but it can help them better track if there are very minor changes over time.  Or she could keep a picture on her personal phone that she could track every 6 months to again pick up on any minor changes  and then take it with her to her next appointment.  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  Did discuss possible treatment with topical Aldara  or surgical intervention or even biopsy for confirmation  Orders Placed This Encounter  Procedures   Allergen, Blueberry, [id number]    No orders of the defined types were placed in this encounter.    I discussed the assessment and treatment plan with the patient. The patient was provided an opportunity to ask questions and all were answered. The patient agreed with the plan and demonstrated an understanding of the  instructions.   The patient was advised to call back or seek an in-person evaluation if the symptoms worsen or if the condition fails to improve as anticipated.  I spent 40 minutes on the day of the encounter to include pre-visit record review, face-to-face time with the patient and post visit ordering of test.   Dorothyann Byars, MD

## 2024-02-17 ENCOUNTER — Telehealth: Payer: Self-pay | Admitting: *Deleted

## 2024-02-17 NOTE — Telephone Encounter (Signed)
 Error

## 2024-02-18 ENCOUNTER — Encounter: Payer: Self-pay | Admitting: Family Medicine

## 2024-02-18 NOTE — Assessment & Plan Note (Addendum)
 It sounds like dermatology may want to actually treat her for tinea pedis.  It is possible she could be having an id reaction that could be causing the widespread rash.  They be very reasonable to treat.  We had recently sent over the terbinafine  cream about a week ago.

## 2024-02-20 DIAGNOSIS — E119 Type 2 diabetes mellitus without complications: Secondary | ICD-10-CM | POA: Diagnosis not present

## 2024-02-20 DIAGNOSIS — R1111 Vomiting without nausea: Secondary | ICD-10-CM | POA: Diagnosis not present

## 2024-02-20 DIAGNOSIS — R000 Tachycardia, unspecified: Secondary | ICD-10-CM | POA: Diagnosis not present

## 2024-02-20 DIAGNOSIS — R1084 Generalized abdominal pain: Secondary | ICD-10-CM | POA: Diagnosis not present

## 2024-02-20 DIAGNOSIS — R1012 Left upper quadrant pain: Secondary | ICD-10-CM | POA: Diagnosis not present

## 2024-02-20 DIAGNOSIS — I10 Essential (primary) hypertension: Secondary | ICD-10-CM | POA: Diagnosis not present

## 2024-02-20 DIAGNOSIS — R110 Nausea: Secondary | ICD-10-CM | POA: Diagnosis not present

## 2024-02-20 DIAGNOSIS — Z8719 Personal history of other diseases of the digestive system: Secondary | ICD-10-CM | POA: Diagnosis not present

## 2024-02-21 DIAGNOSIS — T50904A Poisoning by unspecified drugs, medicaments and biological substances, undetermined, initial encounter: Secondary | ICD-10-CM | POA: Diagnosis not present

## 2024-02-21 DIAGNOSIS — R064 Hyperventilation: Secondary | ICD-10-CM | POA: Diagnosis not present

## 2024-02-21 DIAGNOSIS — R0602 Shortness of breath: Secondary | ICD-10-CM | POA: Diagnosis not present

## 2024-02-21 DIAGNOSIS — R0689 Other abnormalities of breathing: Secondary | ICD-10-CM | POA: Diagnosis not present

## 2024-02-21 DIAGNOSIS — T5991XA Toxic effect of unspecified gases, fumes and vapors, accidental (unintentional), initial encounter: Secondary | ICD-10-CM | POA: Diagnosis not present

## 2024-02-21 DIAGNOSIS — R030 Elevated blood-pressure reading, without diagnosis of hypertension: Secondary | ICD-10-CM | POA: Diagnosis not present

## 2024-02-21 DIAGNOSIS — J45909 Unspecified asthma, uncomplicated: Secondary | ICD-10-CM | POA: Diagnosis not present

## 2024-02-21 DIAGNOSIS — R9431 Abnormal electrocardiogram [ECG] [EKG]: Secondary | ICD-10-CM | POA: Diagnosis not present

## 2024-02-25 DIAGNOSIS — Z1231 Encounter for screening mammogram for malignant neoplasm of breast: Secondary | ICD-10-CM | POA: Diagnosis not present

## 2024-02-25 DIAGNOSIS — F3170 Bipolar disorder, currently in remission, most recent episode unspecified: Secondary | ICD-10-CM | POA: Diagnosis not present

## 2024-02-25 DIAGNOSIS — I10 Essential (primary) hypertension: Secondary | ICD-10-CM | POA: Diagnosis not present

## 2024-02-25 DIAGNOSIS — R569 Unspecified convulsions: Secondary | ICD-10-CM | POA: Diagnosis not present

## 2024-02-25 DIAGNOSIS — T466X5A Adverse effect of antihyperlipidemic and antiarteriosclerotic drugs, initial encounter: Secondary | ICD-10-CM | POA: Diagnosis not present

## 2024-02-25 DIAGNOSIS — I479 Paroxysmal tachycardia, unspecified: Secondary | ICD-10-CM | POA: Diagnosis not present

## 2024-02-25 DIAGNOSIS — G720 Drug-induced myopathy: Secondary | ICD-10-CM | POA: Diagnosis not present

## 2024-02-25 DIAGNOSIS — J4550 Severe persistent asthma, uncomplicated: Secondary | ICD-10-CM | POA: Diagnosis not present

## 2024-02-28 ENCOUNTER — Telehealth: Admitting: Family Medicine

## 2024-02-28 DIAGNOSIS — R1013 Epigastric pain: Secondary | ICD-10-CM | POA: Diagnosis not present

## 2024-02-28 DIAGNOSIS — F4312 Post-traumatic stress disorder, chronic: Secondary | ICD-10-CM

## 2024-02-28 DIAGNOSIS — B9689 Other specified bacterial agents as the cause of diseases classified elsewhere: Secondary | ICD-10-CM

## 2024-02-28 DIAGNOSIS — F411 Generalized anxiety disorder: Secondary | ICD-10-CM | POA: Diagnosis not present

## 2024-02-28 DIAGNOSIS — J019 Acute sinusitis, unspecified: Secondary | ICD-10-CM

## 2024-02-28 MED ORDER — DOXYCYCLINE HYCLATE 100 MG PO TABS: 100.0000 mg | ORAL_TABLET | Freq: Two times a day (BID) | ORAL | 0 refills | Status: DC

## 2024-02-28 NOTE — Progress Notes (Unsigned)
 Pt advised that pcp is running late. She stated that she needs her WHOLE 20 MINS.

## 2024-02-28 NOTE — Progress Notes (Unsigned)
 Virtual Visit via Video Note  I connected with Jennifer [name] on 02/29/24 at 11:10 AM EDT by a video enabled telemedicine application and verified that I am speaking with the correct person using two identifiers.   I discussed the limitations of evaluation and management by telemedicine and the availability of in person appointments. The patient expressed understanding and agreed to proceed.  Patient location: at home Provider location: in office  Subjective:    CC:  No chief complaint on file.   HPI: She has not been feeling well in fact last night she started having some teeth and jaw pain and then started getting some pretty extreme epigastric pain.  She ended up taking Carafate  and Gas-X.  She was finally able to fall asleep but then was waking up feeling sweaty overnight she still does not feel great this morning but the pain is not as intense as it was last night the tooth and jaw pain has eased a little bit as well she just wanted to make sure that she should not seek emergency care she wanted to make sure it did not sound like it was cardiac  She is also had some increased sinus congestion and thick white drainage for several weeks now but more recently she has developed a little bit of a intermittent cough she is now noticing more green-colored sputum mucus and she has been getting a little bit of bloody discharge from the nose.  No fever.  Still very worried about losing her apartment because of rising costs and having to move and being stuck with a place that does not take animals she has PTSD and they provide a lot of emotional support for her  Right now she is also having some major transportation issues her car is out of commission.  Past medical history, Surgical history, Family history not pertinant except as noted below, Social history, Allergies, and medications have been entered into the medical record, reviewed, and corrections made.    Objective:    General:  Speaking clearly in complete sentences without any shortness of breath.  Alert and oriented x3.  Normal judgment. No apparent acute distress.    Impression and Recommendations:    Problem List Items Addressed This Visit       Other   GAD (generalized anxiety disorder)   Did send over a few tabs of alprazolam  for her to use if needed.  Hopefully she still being able to connect with a therapist.  I know that it has been a struggle to find somebody that could work with her consistently.      Relevant Medications   ALPRAZolam  (XANAX ) 0.25 MG tablet   Chronic post-traumatic stress disorder (PTSD)   I am happy to support her if she needs any paperwork completed for support animal for an apartment as she is worried because she may end up having to move soon because of increasing prices.  I reassured her I would be happy to support her but when the time it comes I would prefer for them to either provide a form or if we need to do a letter that is fine but do not want to do it preemptively unless it is truly needed as most places have their own form that they request to be completed.      Relevant Medications   ALPRAZolam  (XANAX ) 0.25 MG tablet   Other Visit Diagnoses       Epigastric pain    -  Primary  Acute bacterial sinusitis       Relevant Medications   doxycycline  (VIBRA -TABS) 100 MG tablet       Epigastric pain-gave her reassurance and encouraged her to restart and take her famotidine  this morning or her omeprazole .  She wonders if she could have gastritis.  But then the main treatment for that is H2 blockers or PPIs.  She says she will take a famotidine  after we get off the phone.  Acute bacterial sinusitis-will treat with doxycycline  call if not better in 1 week..    No orders of the defined types were placed in this encounter.   Meds ordered this encounter  Medications   doxycycline  (VIBRA -TABS) 100 MG tablet    Sig: Take 1 tablet (100 mg total) by mouth 2 (two) times  daily.    Dispense:  14 tablet    Refill:  0   ALPRAZolam  (XANAX ) 0.25 MG tablet    Sig: Take 1 tablet (0.25 mg total) by mouth 2 (two) times daily as needed for anxiety.    Dispense:  5 tablet    Refill:  0     I discussed the assessment and treatment plan with the patient. The patient was provided an opportunity to ask questions and all were answered. The patient agreed with the plan and demonstrated an understanding of the instructions.   The patient was advised to call back or seek an in-person evaluation if the symptoms worsen or if the condition fails to improve as anticipated.   Dorothyann Byars, MD

## 2024-02-29 DIAGNOSIS — J40 Bronchitis, not specified as acute or chronic: Secondary | ICD-10-CM | POA: Diagnosis not present

## 2024-02-29 DIAGNOSIS — I10 Essential (primary) hypertension: Secondary | ICD-10-CM | POA: Diagnosis not present

## 2024-02-29 DIAGNOSIS — R6884 Jaw pain: Secondary | ICD-10-CM | POA: Diagnosis not present

## 2024-02-29 DIAGNOSIS — R000 Tachycardia, unspecified: Secondary | ICD-10-CM | POA: Diagnosis not present

## 2024-02-29 MED ORDER — ALPRAZOLAM 0.25 MG PO TABS: 0.2500 mg | ORAL_TABLET | Freq: Two times a day (BID) | ORAL | 0 refills | Status: DC | PRN

## 2024-02-29 NOTE — Assessment & Plan Note (Signed)
 I am happy to support her if she needs any paperwork completed for support animal for an apartment as she is worried because she may end up having to move soon because of increasing prices.  I reassured her I would be happy to support her but when the time it comes I would prefer for them to either provide a form or if we need to do a letter that is fine but do not want to do it preemptively unless it is truly needed as most places have their own form that they request to be completed.

## 2024-02-29 NOTE — Assessment & Plan Note (Signed)
 Did send over a few tabs of alprazolam  for her to use if needed.  Hopefully she still being able to connect with a therapist.  I know that it has been a struggle to find somebody that could work with her consistently.

## 2024-03-03 ENCOUNTER — Telehealth: Payer: Self-pay | Admitting: Family Medicine

## 2024-03-03 DIAGNOSIS — Z1231 Encounter for screening mammogram for malignant neoplasm of breast: Secondary | ICD-10-CM

## 2024-03-03 MED ORDER — DIAZEPAM 5 MG PO TABS: 2.5000 mg | ORAL_TABLET | Freq: Two times a day (BID) | ORAL | 0 refills | Status: DC | PRN

## 2024-03-03 NOTE — Telephone Encounter (Addendum)
 Pt is requesting a referral for mammogram and US  to the imaging place she went to last year in [facility] on [address]. Please Advise.   Also states that she was prescribed Xanax  but would prefer the lower dose diazepam , that she can break in half.

## 2024-03-03 NOTE — Telephone Encounter (Signed)
 Prescription sent for diazepam .  Please place order and fax order for mammogram per our screening protocol.

## 2024-03-04 ENCOUNTER — Telehealth: Payer: Self-pay | Admitting: Family Medicine

## 2024-03-04 ENCOUNTER — Encounter: Payer: Self-pay | Admitting: Family Medicine

## 2024-03-04 ENCOUNTER — Telehealth: Admitting: Family Medicine

## 2024-03-04 DIAGNOSIS — R059 Cough, unspecified: Secondary | ICD-10-CM | POA: Diagnosis not present

## 2024-03-04 DIAGNOSIS — T17308S Unspecified foreign body in larynx causing other injury, sequela: Secondary | ICD-10-CM

## 2024-03-04 DIAGNOSIS — N644 Mastodynia: Secondary | ICD-10-CM | POA: Diagnosis not present

## 2024-03-04 DIAGNOSIS — R0602 Shortness of breath: Secondary | ICD-10-CM | POA: Diagnosis not present

## 2024-03-04 DIAGNOSIS — R9431 Abnormal electrocardiogram [ECG] [EKG]: Secondary | ICD-10-CM | POA: Diagnosis not present

## 2024-03-04 DIAGNOSIS — R0600 Dyspnea, unspecified: Secondary | ICD-10-CM | POA: Diagnosis not present

## 2024-03-04 DIAGNOSIS — R21 Rash and other nonspecific skin eruption: Secondary | ICD-10-CM | POA: Diagnosis not present

## 2024-03-04 DIAGNOSIS — R0989 Other specified symptoms and signs involving the circulatory and respiratory systems: Secondary | ICD-10-CM | POA: Diagnosis not present

## 2024-03-04 DIAGNOSIS — R053 Chronic cough: Secondary | ICD-10-CM

## 2024-03-04 DIAGNOSIS — R0689 Other abnormalities of breathing: Secondary | ICD-10-CM | POA: Diagnosis not present

## 2024-03-04 DIAGNOSIS — Z20822 Contact with and (suspected) exposure to covid-19: Secondary | ICD-10-CM | POA: Diagnosis not present

## 2024-03-04 DIAGNOSIS — Z87891 Personal history of nicotine dependence: Secondary | ICD-10-CM | POA: Diagnosis not present

## 2024-03-04 DIAGNOSIS — I10 Essential (primary) hypertension: Secondary | ICD-10-CM | POA: Diagnosis not present

## 2024-03-04 NOTE — Progress Notes (Signed)
 Virtual Visit via Video Note  I connected with Jennifer [name] on 03/04/24 at  3:40 PM EDT by a video enabled telemedicine application and verified that I am speaking with the correct person using two identifiers.   I discussed the limitations of evaluation and management by telemedicine and the availability of in person appointments. The patient expressed understanding and agreed to proceed.  Patient location: at home Provider location: in office  Subjective:    CC:  No chief complaint on file.   HPI: She has a couple concerns today she was actually seen in the emergency department day.  Last night she started breaking out with bumps and rash on her face.  She has not been able to pinpoint what she thinks may have caused it.  But she is still struggling with a sore throat and cough and this morning felt like she could not clear her airway very well and could not breathe.  So she did go the emergency room.  She has been back on her famotidine  for reflux.  We had recently seen her and evaluated her for sinusitis and started doxycycline  but she has not been able to take it consistently because it has been making her stomach hurt.  She has continued with her saline rinses but she has just been getting thick white almost frothy mucus from her nose and occasionally coughing up more bright yellow sputum.  But she feels like again it is mostly in the upper airway.  Is been taking an expectorant pretty consistently for the last week as well.  Last night she did wash her face with a Cereve cleanser which she has used a few times and has been using the hydrocortisone  cream given to her by the dermatologist.  Having some pain in her right breast.  That is the one that she had a biopsy on back when she was 18 no known cancer history last mammogram was a little over 2 years ago.  To have labs done in the emergency department and wanted me to take a look at those.  Past medical history, Surgical  history, Family history not pertinant except as noted below, Social history, Allergies, and medications have been entered into the medical record, reviewed, and corrections made.    Objective:    General: Speaking clearly in complete sentences without any shortness of breath.  Alert and oriented x3.  Normal judgment. No apparent acute distress.    Impression and Recommendations:    Problem List Items Addressed This Visit       Other   Chronic cough - Primary   Relevant Orders   Ambulatory referral to ENT   Other Visit Diagnoses       Choking, sequela       Relevant Orders   Ambulatory referral to ENT     Throat clearing       Relevant Orders   Ambulatory referral to ENT     Rash of face         Breast pain, right       Relevant Orders   MM 3D DIAGNOSTIC MAMMOGRAM BILATERAL BREAST   MS US  BREAST LTD UNI RIGHT INC AXILLA      With her chronic cough mucus throat clearing-recommend ENT referral she has had issues in the past and has been followed by ENT in the past she even had Botox at 1 point for some vocal cord issues.  She is back on her famotidine  which is good  in case there is some component of GERD or reflux which there likely is especially with increased cough.  In regard to the rash on the face it looks kind of papular so unclear if it may have been a specific food or products that she is using on her skin it is difficult to say.  Right breast pain-will make sure that we have got diagnostic mammo and ultrasound ordered for her.  Orders Placed This Encounter  Procedures   MM 3D DIAGNOSTIC MAMMOGRAM BILATERAL BREAST    Standing Status:   Future    Expiration Date:   03/04/2025    Reason for Exam (SYMPTOM  OR DIAGNOSIS REQUIRED):   right breast pain, last mammo 2 yr ago    Preferred imaging location?:   External    Is the patient pregnant?:   No   MS US  BREAST LTD UNI RIGHT INC AXILLA    Standing Status:   Future    Expiration Date:   03/04/2025    Reason for Exam  (SYMPTOM  OR DIAGNOSIS REQUIRED):   same    Preferred imaging location?:   External   Ambulatory referral to ENT    Referral Priority:   Routine    Referral Type:   Consultation    Referral Reason:   Specialty Services Required    Requested Specialty:   Otolaryngology    Number of Visits Requested:   1    No orders of the defined types were placed in this encounter.    I discussed the assessment and treatment plan with the patient. The patient was provided an opportunity to ask questions and all were answered. The patient agreed with the plan and demonstrated an understanding of the instructions.   The patient was advised to call back or seek an in-person evaluation if the symptoms worsen or if the condition fails to improve as anticipated.   Dorothyann Byars, MD

## 2024-03-04 NOTE — Telephone Encounter (Signed)
 Copied from CRM [id number]. Topic: General - Other >> Mar 04, 2024  4:01 PM Suzette B wrote: Reason for CRM: Patient called from [phone] very hostile in regards to the appointment, I attempted to get her to verify her PHI she advised me she wasn't wasting all that time, her words look I had a virtual appt and was told if I run into any issues to call the office and someone would transfer me. I advised the patient that In order for me to assist her I would have to verify her information. Patient identified and insisted I was wasting time. I advised her I was trying to get someone on the line to assist with her appt issue. She disconnected the calI let her know I was calling someone on the care team. I called CAL , while on the call she hung upand it rolled over to clinic in which the nurse advised Dr. Alvan while I was on the call to inform the patient she was going to be about 30 minutes because she was running behind.I called patient back to inform her of the message from Dr. Alvan Patient stated,  so you mean to tell me, I still have to sit her and wait another 30 minutes. Before I could offer anymore assistance patient disconnect call in the midst of me speaking with her.

## 2024-03-05 ENCOUNTER — Telehealth: Payer: Self-pay

## 2024-03-05 NOTE — Telephone Encounter (Signed)
 Spoke with Lenward at [facility] - she states this could have been an order rather than a result she is not certain.  Did the patient want this sent to the radiology department at [facility]  or do you know what could be happening here. I will be happy to refax to whatever location it needs to go to.

## 2024-03-05 NOTE — Telephone Encounter (Signed)
 Copied from CRM [id number]. Topic: General - Other >> Mar 05, 2024  1:55 PM Mercer PEDLAR wrote: Reason for CRM: Lenward calling from [facility] to notify office that they received mammogram results for patient but she believes that they were sent to wrong office.  Callback: [phone]

## 2024-03-07 DIAGNOSIS — R531 Weakness: Secondary | ICD-10-CM | POA: Diagnosis not present

## 2024-03-07 DIAGNOSIS — Z8679 Personal history of other diseases of the circulatory system: Secondary | ICD-10-CM | POA: Diagnosis not present

## 2024-03-07 DIAGNOSIS — I471 Supraventricular tachycardia, unspecified: Secondary | ICD-10-CM | POA: Diagnosis not present

## 2024-03-07 DIAGNOSIS — E559 Vitamin D deficiency, unspecified: Secondary | ICD-10-CM | POA: Diagnosis not present

## 2024-03-07 DIAGNOSIS — R519 Headache, unspecified: Secondary | ICD-10-CM | POA: Diagnosis not present

## 2024-03-07 DIAGNOSIS — R000 Tachycardia, unspecified: Secondary | ICD-10-CM | POA: Diagnosis not present

## 2024-03-07 DIAGNOSIS — Z9889 Other specified postprocedural states: Secondary | ICD-10-CM | POA: Diagnosis not present

## 2024-03-07 DIAGNOSIS — R079 Chest pain, unspecified: Secondary | ICD-10-CM | POA: Diagnosis not present

## 2024-03-07 DIAGNOSIS — F419 Anxiety disorder, unspecified: Secondary | ICD-10-CM | POA: Diagnosis not present

## 2024-03-07 DIAGNOSIS — R002 Palpitations: Secondary | ICD-10-CM | POA: Diagnosis not present

## 2024-03-07 DIAGNOSIS — I491 Atrial premature depolarization: Secondary | ICD-10-CM | POA: Diagnosis not present

## 2024-03-07 DIAGNOSIS — R42 Dizziness and giddiness: Secondary | ICD-10-CM | POA: Diagnosis not present

## 2024-03-07 DIAGNOSIS — I10 Essential (primary) hypertension: Secondary | ICD-10-CM | POA: Diagnosis not present

## 2024-03-10 ENCOUNTER — Telehealth: Payer: Self-pay | Admitting: Family Medicine

## 2024-03-10 DIAGNOSIS — R9431 Abnormal electrocardiogram [ECG] [EKG]: Secondary | ICD-10-CM | POA: Diagnosis not present

## 2024-03-10 NOTE — Telephone Encounter (Signed)
 It has been sent!

## 2024-03-10 NOTE — Telephone Encounter (Signed)
 Pt would like to be scheduled with Delayne Mirna Massa, DO  at [facility]

## 2024-03-11 DIAGNOSIS — R0789 Other chest pain: Secondary | ICD-10-CM | POA: Diagnosis not present

## 2024-03-11 DIAGNOSIS — J029 Acute pharyngitis, unspecified: Secondary | ICD-10-CM | POA: Diagnosis not present

## 2024-03-11 DIAGNOSIS — R079 Chest pain, unspecified: Secondary | ICD-10-CM | POA: Diagnosis not present

## 2024-03-12 DIAGNOSIS — I499 Cardiac arrhythmia, unspecified: Secondary | ICD-10-CM | POA: Diagnosis not present

## 2024-03-12 DIAGNOSIS — R000 Tachycardia, unspecified: Secondary | ICD-10-CM | POA: Diagnosis not present

## 2024-03-13 ENCOUNTER — Other Ambulatory Visit: Payer: Self-pay

## 2024-03-13 DIAGNOSIS — R9431 Abnormal electrocardiogram [ECG] [EKG]: Secondary | ICD-10-CM | POA: Diagnosis not present

## 2024-03-13 MED ORDER — LEVALBUTEROL TARTRATE 45 MCG/ACT IN AERO: 1.0000 | INHALATION_SPRAY | RESPIRATORY_TRACT | 1 refills | Status: DC | PRN

## 2024-03-13 NOTE — Telephone Encounter (Signed)
 Received refill request from [facility] for Levalbuterol  45/act AER

## 2024-03-14 DIAGNOSIS — R9431 Abnormal electrocardiogram [ECG] [EKG]: Secondary | ICD-10-CM | POA: Diagnosis not present

## 2024-03-14 DIAGNOSIS — R0789 Other chest pain: Secondary | ICD-10-CM | POA: Diagnosis not present

## 2024-03-14 DIAGNOSIS — S46012A Strain of muscle(s) and tendon(s) of the rotator cuff of left shoulder, initial encounter: Secondary | ICD-10-CM | POA: Diagnosis not present

## 2024-03-14 DIAGNOSIS — M25512 Pain in left shoulder: Secondary | ICD-10-CM | POA: Diagnosis not present

## 2024-03-14 LAB — LAB REPORT - SCANNED: EGFR: 90

## 2024-03-20 DIAGNOSIS — I10 Essential (primary) hypertension: Secondary | ICD-10-CM | POA: Diagnosis not present

## 2024-03-20 DIAGNOSIS — D500 Iron deficiency anemia secondary to blood loss (chronic): Secondary | ICD-10-CM | POA: Diagnosis not present

## 2024-03-20 LAB — LAB REPORT - SCANNED: EGFR: 76

## 2024-03-23 DIAGNOSIS — R079 Chest pain, unspecified: Secondary | ICD-10-CM | POA: Diagnosis not present

## 2024-03-23 DIAGNOSIS — R0789 Other chest pain: Secondary | ICD-10-CM | POA: Diagnosis not present

## 2024-03-23 DIAGNOSIS — R030 Elevated blood-pressure reading, without diagnosis of hypertension: Secondary | ICD-10-CM | POA: Diagnosis not present

## 2024-03-23 DIAGNOSIS — R42 Dizziness and giddiness: Secondary | ICD-10-CM | POA: Diagnosis not present

## 2024-03-24 ENCOUNTER — Telehealth: Payer: Self-pay | Admitting: Family Medicine

## 2024-03-24 DIAGNOSIS — R9431 Abnormal electrocardiogram [ECG] [EKG]: Secondary | ICD-10-CM | POA: Diagnosis not present

## 2024-03-24 NOTE — Telephone Encounter (Addendum)
 Pt is requested a letter for emotional animal support. States that a previous counselor diagnosed her  PTSD, Anxiety, and depression

## 2024-03-24 NOTE — Telephone Encounter (Signed)
 I need to know who it needs to be addressed to.

## 2024-03-25 ENCOUNTER — Telehealth: Admitting: Family Medicine

## 2024-03-25 DIAGNOSIS — M545 Low back pain, unspecified: Secondary | ICD-10-CM | POA: Diagnosis not present

## 2024-03-25 DIAGNOSIS — R9431 Abnormal electrocardiogram [ECG] [EKG]: Secondary | ICD-10-CM | POA: Diagnosis not present

## 2024-03-25 DIAGNOSIS — F419 Anxiety disorder, unspecified: Secondary | ICD-10-CM | POA: Diagnosis not present

## 2024-03-25 DIAGNOSIS — E876 Hypokalemia: Secondary | ICD-10-CM | POA: Diagnosis not present

## 2024-03-25 DIAGNOSIS — R000 Tachycardia, unspecified: Secondary | ICD-10-CM | POA: Diagnosis not present

## 2024-03-25 DIAGNOSIS — R42 Dizziness and giddiness: Secondary | ICD-10-CM | POA: Diagnosis not present

## 2024-03-25 DIAGNOSIS — M47816 Spondylosis without myelopathy or radiculopathy, lumbar region: Secondary | ICD-10-CM | POA: Diagnosis not present

## 2024-03-25 DIAGNOSIS — R002 Palpitations: Secondary | ICD-10-CM | POA: Diagnosis not present

## 2024-03-25 DIAGNOSIS — R519 Headache, unspecified: Secondary | ICD-10-CM | POA: Diagnosis not present

## 2024-03-25 NOTE — Telephone Encounter (Signed)
 NO , as I told her before, most places have a form that has to be completed.

## 2024-03-26 ENCOUNTER — Telehealth (INDEPENDENT_AMBULATORY_CARE_PROVIDER_SITE_OTHER): Admitting: Family Medicine

## 2024-03-26 DIAGNOSIS — G35 Multiple sclerosis: Secondary | ICD-10-CM | POA: Diagnosis not present

## 2024-03-26 DIAGNOSIS — F331 Major depressive disorder, recurrent, moderate: Secondary | ICD-10-CM | POA: Diagnosis not present

## 2024-03-26 DIAGNOSIS — Z7984 Long term (current) use of oral hypoglycemic drugs: Secondary | ICD-10-CM

## 2024-03-26 DIAGNOSIS — E1169 Type 2 diabetes mellitus with other specified complication: Secondary | ICD-10-CM | POA: Diagnosis not present

## 2024-03-26 DIAGNOSIS — E876 Hypokalemia: Secondary | ICD-10-CM | POA: Diagnosis not present

## 2024-03-26 DIAGNOSIS — C519 Malignant neoplasm of vulva, unspecified: Secondary | ICD-10-CM

## 2024-03-26 NOTE — Assessment & Plan Note (Signed)
 She is putting things on hold right now until her living situation is settled.

## 2024-03-26 NOTE — Assessment & Plan Note (Signed)
 Hasn't been ale to reconnect with a therapist.  She if felel very stressed with her living situation right now.  Will provide letter for support animal.

## 2024-03-26 NOTE — Assessment & Plan Note (Signed)
 Encouraged her to restart her 20 mill equivalent potassium supplement at least for a few days she does not take it every day sometimes potassium maintains on its own but I do think that would be reasonable for the next couple days to see if that is helpful.

## 2024-03-26 NOTE — Assessment & Plan Note (Signed)
 Assured her that her 2-hour post eating is coming down under 180 so it is doing what it needs to do continue to work on healthy food choices that she is best able to and staying active.

## 2024-03-26 NOTE — Progress Notes (Signed)
 Virtual Visit via Video Note  I connected with Jennifer [name] on 03/26/24 at 11:30 AM EDT by a video enabled telemedicine application and verified that I am speaking with the correct person using two identifiers.   I discussed the limitations of evaluation and management by telemedicine and the availability of in person appointments. The patient expressed understanding and agreed to proceed.  Patient location: at home Provider location: in office  Subjective:    CC:   Chief Complaint  Patient presents with   Medical Management of Chronic Issues    HPI: Rise is up in September and the apartment complex will not renew their lease..  They are trying to find a place.  Unfortunately they are trying to find something on their limited income.  Her husband is no longer able to work.  She does have a cat who she is very close to and provides a lot of emotional support for her.  She needs to be able to take the cat with her in a lot of places that she is looking at require a letter to be completed saying that she has an emotional support animal.  She is really anxious that she will end up homeless.  Went to ED at [facility] yesterday with HA and tachycardia. Potassium was low.  They gave her some potassium   Have had some tremors and glucose going up.  She had a meal from [employer] and blood sugar went up over 200 right after eating now about an hour and a half later it is under 180.  Husband has afib again and might have cardioversion again. Blood count is normal.  Would like an emotional support animal letter.  Her cat is really important to her.   Has an iron infusion on Friday.  Her hematologist has retired.    Past medical history, Surgical history, Family history not pertinant except as noted below, Social history, Allergies, and medications have been entered into the medical record, reviewed, and corrections made.    Objective:    General: Speaking clearly in complete  sentences without any shortness of breath.  Alert and oriented x3.  Normal judgment. No apparent acute distress.    Impression and Recommendations:    Problem List Items Addressed This Visit       Endocrine   Type 2 diabetes mellitus with other specified complication, without long-term current use of insulin (HCC)   Assured her that her 2-hour post eating is coming down under 180 so it is doing what it needs to do continue to work on healthy food choices that she is best able to and staying active.        Nervous and Auditory   Multiple sclerosis (HCC)     Musculoskeletal and Integument   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HCC)   She is putting things on hold right now until her living situation is settled.        Other   Moderate episode of recurrent major depressive disorder (HCC)   Hasn't been ale to reconnect with a therapist.  She if felel very stressed with her living situation right now.  Will provide letter for support animal.       Hypokalemia - Primary   Encouraged her to restart her 20 mill equivalent potassium supplement at least for a few days she does not take it every day sometimes potassium maintains on its own but I do think that would be reasonable for the next couple  days to see if that is helpful.       No orders of the defined types were placed in this encounter.   No orders of the defined types were placed in this encounter.   I discussed the assessment and treatment plan with the patient. The patient was provided an opportunity to ask questions and all were answered. The patient agreed with the plan and demonstrated an understanding of the instructions.   The patient was advised to call back or seek an in-person evaluation if the symptoms worsen or if the condition fails to improve as anticipated.  I spent 30 minutes on the day of the encounter to include pre-visit record review, face-to-face time with the patient and post visit ordering of  test.   Jennifer Byars, MD

## 2024-03-27 DIAGNOSIS — I10 Essential (primary) hypertension: Secondary | ICD-10-CM | POA: Diagnosis not present

## 2024-03-27 LAB — LAB REPORT - SCANNED

## 2024-03-28 DIAGNOSIS — R202 Paresthesia of skin: Secondary | ICD-10-CM | POA: Diagnosis not present

## 2024-03-28 DIAGNOSIS — R200 Anesthesia of skin: Secondary | ICD-10-CM | POA: Diagnosis not present

## 2024-03-28 DIAGNOSIS — R109 Unspecified abdominal pain: Secondary | ICD-10-CM | POA: Diagnosis not present

## 2024-03-28 DIAGNOSIS — R457 State of emotional shock and stress, unspecified: Secondary | ICD-10-CM | POA: Diagnosis not present

## 2024-03-28 DIAGNOSIS — R42 Dizziness and giddiness: Secondary | ICD-10-CM | POA: Diagnosis not present

## 2024-03-28 DIAGNOSIS — G4489 Other headache syndrome: Secondary | ICD-10-CM | POA: Diagnosis not present

## 2024-03-29 DIAGNOSIS — R200 Anesthesia of skin: Secondary | ICD-10-CM | POA: Diagnosis not present

## 2024-03-29 DIAGNOSIS — R079 Chest pain, unspecified: Secondary | ICD-10-CM | POA: Diagnosis not present

## 2024-03-31 ENCOUNTER — Encounter (HOSPITAL_BASED_OUTPATIENT_CLINIC_OR_DEPARTMENT_OTHER): Payer: Self-pay | Admitting: Emergency Medicine

## 2024-03-31 ENCOUNTER — Emergency Department (HOSPITAL_BASED_OUTPATIENT_CLINIC_OR_DEPARTMENT_OTHER)

## 2024-03-31 ENCOUNTER — Other Ambulatory Visit: Payer: Self-pay

## 2024-03-31 ENCOUNTER — Emergency Department (HOSPITAL_BASED_OUTPATIENT_CLINIC_OR_DEPARTMENT_OTHER): Admission: EM | Admit: 2024-03-31 | Discharge: 2024-03-31 | Disposition: A | Discharge status: Home / Self Care

## 2024-03-31 ENCOUNTER — Telehealth: Payer: Self-pay | Admitting: Family Medicine

## 2024-03-31 DIAGNOSIS — W010XXA Fall on same level from slipping, tripping and stumbling without subsequent striking against object, initial encounter: Secondary | ICD-10-CM | POA: Insufficient documentation

## 2024-03-31 DIAGNOSIS — M545 Low back pain, unspecified: Secondary | ICD-10-CM | POA: Insufficient documentation

## 2024-03-31 DIAGNOSIS — I700 Atherosclerosis of aorta: Secondary | ICD-10-CM | POA: Diagnosis not present

## 2024-03-31 DIAGNOSIS — Y92009 Unspecified place in unspecified non-institutional (private) residence as the place of occurrence of the external cause: Secondary | ICD-10-CM | POA: Insufficient documentation

## 2024-03-31 DIAGNOSIS — M549 Dorsalgia, unspecified: Secondary | ICD-10-CM | POA: Diagnosis not present

## 2024-03-31 DIAGNOSIS — I10 Essential (primary) hypertension: Secondary | ICD-10-CM | POA: Diagnosis not present

## 2024-03-31 MED ORDER — IBUPROFEN 400 MG PO TABS
600.0000 mg | ORAL_TABLET | Freq: Once | ORAL | Status: AC
  Administered 2024-03-31: 600 mg via ORAL
  Filled 2024-03-31: qty 1

## 2024-03-31 MED ORDER — LIDOCAINE 5 % EX PTCH: 1.0000 | MEDICATED_PATCH | CUTANEOUS | 0 refills | Status: DC

## 2024-03-31 NOTE — Telephone Encounter (Signed)
 Not worrisome at this point and not causing dizziness.

## 2024-03-31 NOTE — ED Notes (Addendum)
 Pt alert and oriented X 4 at the time of discharge. RR even and unlabored. No acute distress noted. Pt verbalized understanding of discharge instructions as discussed. Pt transported to lobby at time of discharge.

## 2024-03-31 NOTE — Telephone Encounter (Signed)
 Pt wants you to check her labs BMP, states that the anion gap level is low, lower than it has been. She wants to know if this is something she should worry about since she was having dizzy spells. Please advice

## 2024-03-31 NOTE — ED Provider Notes (Signed)
 [place] [facility] Provider Note   CSN: [id number] Arrival date & time: 03/31/24  1234     Patient presents with: Back Pain   Jennifer [name] is a 58 y.o. female.   58 year old female presenting with back pain.  Patient tells me that she fell a week and a half ago, she tells me she tripped in her home landing on concrete floors and against a raised area, she is unable to elaborate on this.  She denies numbness/tingling of lower extremities, pain is primarily localized to her low back however she does tell me this morning when she went to sit on the couch she had pain shoot up from her low back to the back of my head, she has tried Tylenol /ibuprofen  for pain with some relief of her symptoms.  This is the patient's fifth visit to the emergency department this month, she has had extensive workup including a brain MRI on 8/15 at an outside facility and a lumbar x-ray on 8/12 at an outside facility.  She tells me that she has a history of tachycardia and that the doctors always get fixated on that.  Denies fever, bowel/bladder incontinence.  She also notes occasional dizziness, like I am going to fall, denies room spinning sensation.  She tells me that she has a walker at home for her MS, she is needed to rely on this more recently to assist in her ambulation.   Back Pain      Prior to Admission medications   Medication Sig Start Date End Date Taking? Authorizing Provider  AMBULATORY NON FORMULARY MEDICATION Medication Name: Standing order for CBC/diff (monthly) x 1 year 02/27/23   Alvan Dorothyann BIRCH, MD  AMBULATORY NON FORMULARY MEDICATION Medication Name: standing order BMP(weekly) CBC,with diff(Monthly) x 1 year Fax:[phone] 11/29/23   Alvan Dorothyann BIRCH, MD  Blood Glucose Monitoring Suppl DEVI 1 each by Does not apply route in the morning, at noon, and at bedtime. May substitute to any manufacturer covered by patient's  insurance.[facility] METER 11/02/23   Alvan Dorothyann BIRCH, MD  Continuous Glucose Receiver (FREESTYLE LIBRE 3 READER) DEVI 1 each by Does not apply route daily. Freestyle libre 3 plus 11/02/23   Alvan Dorothyann BIRCH, MD  Continuous Glucose Sensor (FREESTYLE LIBRE 3 SENSOR) MISC 1 each by Does not apply route every 14 (fourteen) days. Freestyle Libre 3 Plus. Place 1 sensor on the skin every 14 days. Use to check glucose continuously 11/02/23   Alvan Dorothyann BIRCH, MD  diazepam  (VALIUM ) 5 MG tablet Take 0.5-1 tablets (2.5-5 mg total) by mouth every 12 (twelve) hours as needed for anxiety. 03/03/24   Alvan Dorothyann BIRCH, MD  EPINEPHrine  (EPIPEN  2-PAK) 0.3 mg/0.3 mL IJ SOAJ injection Inject 0.3 mg into the muscle as needed for anaphylaxis. 10/09/23   Alvan Dorothyann BIRCH, MD  famotidine  (PEPCID ) 40 MG tablet Take 1 tablet (40 mg total) by mouth 2 (two) times daily. 05/04/23 08/02/24  Alvan Dorothyann BIRCH, MD  fluticasone  (FLONASE ) 50 MCG/ACT nasal spray Place 1 spray into both nostrils daily. 08/23/23   Marinda Rocky SAILOR, MD  Glucose Blood (BLOOD GLUCOSE TEST STRIPS) STRP 1 each by In Vitro route daily as needed. May substitute to any manufacturer covered by patient's insurance. 10/30/23   Alvan Dorothyann BIRCH, MD  glucose blood (ONETOUCH VERIO) test strip Check glucose up to 3 times daily. 01/11/24   Alvan Dorothyann BIRCH, MD  Lancets [facility] DELICA PLUS LANCET30G) MISC Check glucose up to 3 times daily.  01/11/24   Alvan Dorothyann BIRCH, MD  levalbuterol  (XOPENEX  HFA) 45 MCG/ACT inhaler Inhale 1 puff into the lungs every 4 (four) hours as needed for wheezing or shortness of breath. 03/13/24   Marinda Rocky SAILOR, MD  Metformin  HCl 500 MG/5ML SOLN Take 5 mLs (500 mg total) by mouth 2 (two) times daily with a meal. 01/28/24   Alvan Dorothyann BIRCH, MD  metoprolol  tartrate (LOPRESSOR ) 12.5 mg TABS tablet Take 12.5 mg by mouth 2 (two) times daily.    [provider]  metoprolol  tartrate (LOPRESSOR ) 50 MG tablet  Take 50 mg by mouth 2 (two) times daily. 12/05/23   [provider]  potassium chloride  (KLOR-CON ) 20 MEQ packet Take 20 mEq by mouth daily. 10/09/23   Alvan Dorothyann BIRCH, MD  sodium chloride  0.9 % nebulizer solution Take 3 mLs by nebulization as needed for wheezing. 01/18/24   Cottie Donnice PARAS, MD  terbinafine  (LAMISIL ) 1 % cream Apply 1 Application topically 2 (two) times daily. X 6-8 weeks 02/01/24   Alvan Dorothyann BIRCH, MD    Allergies: Azithromycin, Ciprofloxacin , Codeine, Erythromycin base, Sulfa antibiotics, Sulfasalazine, Telmisartan , Ace inhibitors, Aspirin, Atenolol , Avelox  [moxifloxacin  hcl in nacl], Beta adrenergic blockers, Buspar  [buspirone ], Butorphanol tartrate, Cetirizine, Clonidine  hcl, Cortisone, Erythromycin, Fentanyl , Fluoxetine hcl, Ketorolac  tromethamine , Lidocaine , Lisinopril , Metoclopramide hcl, Midazolam , Montelukast, Montelukast sodium, Naproxen, Nitroglycerin , Paroxetine, Penicillins, Pravastatin , Promethazine , Promethazine  hcl, Quinolones, Serotonin reuptake inhibitors (ssris), Sertraline hcl, Stelazine [trifluoperazine], Tobramycin, Trifluoperazine hcl, Atrovent  nasal spray [ipratropium], Diltiazem , Iodinated contrast media, Metoprolol , Polyethylene glycol 3350 , Propoxyphene, Adhesive [tape], Butorphanol, Ceftriaxone, Iron, Metoclopramide, Metronidazole , Other, Prednisone , Prochlorperazine, Venlafaxine, and Zyrtec [cetirizine hcl]    Review of Systems  Musculoskeletal:  Positive for back pain.    Updated Vital Signs  Vitals:   03/31/24 1251 03/31/24 1254 03/31/24 1530 03/31/24 1542  BP:  (!) 154/96 (!) 163/78 (!) 163/78  Pulse:  (!) 103 71 71  Resp:  16  16  Temp:  98.6 F (37 C)  98.5 F (36.9 C)  TempSrc:  Oral  Oral  SpO2:  99% 95% 96%  Weight: 104.3 kg     Height: 5' 2 (1.575 m)        Physical Exam Vitals and nursing note reviewed.  HENT:     Head: Normocephalic.  Eyes:     Extraocular Movements: Extraocular movements intact.   Cardiovascular:     Rate and Rhythm: Normal rate.  Pulmonary:     Effort: Pulmonary effort is normal.  Musculoskeletal:     Cervical back: Normal range of motion.     Comments: Moves all extremity spontaneously without difficulty 5/5 strength against resistance of bilateral lower extremities  Skin:    General: Skin is warm and dry.  Neurological:     Mental Status: She is alert and oriented to person, place, and time.     Comments: No appreciable sensory deficit or weakness of bilateral lower extremities Facial expressions are symmetric and intact without evidence of facial droop Normal cerebellar testing including finger-to-nose  Psychiatric:     Comments: tearful     (all labs ordered are listed, but only abnormal results are displayed) Labs Reviewed - No data to display  EKG: EKG Interpretation Date/Time:  Monday March 31 2024 14:08:00 EDT Ventricular Rate:  78 PR Interval:  119 QRS Duration:  101 QT Interval:  378 QTC Calculation: 431 R Axis:   39  Text Interpretation: Sinus rhythm Borderline short PR interval Borderline T abnormalities, diffuse leads Confirmed by Ula Barter [phone]) on 03/31/2024 2:14:53  PM  Radiology: CT Lumbar Spine Wo Contrast Result Date: 03/31/2024 EXAM: CT OF THE LUMBAR SPINE WITHOUT CONTRAST 03/31/2024 02:30:52 PM TECHNIQUE: CT of the lumbar spine was performed without the administration of intravenous contrast. Multiplanar reformatted images are provided for review. Automated exposure control, iterative reconstruction, and/or weight based adjustment of the mA/kV was utilized to reduce the radiation dose to as low as reasonably achievable. COMPARISON: CT of the lumbar spine dated 02/18/2004. CLINICAL HISTORY: Back pain, fall. FINDINGS: BONES AND ALIGNMENT: Normal vertebral body heights. No acute fracture or suspicious bone lesion. There is a slight levocurvature of the lumbar spine. DEGENERATIVE CHANGES: No significant degenerative changes. SOFT  TISSUES: No acute abnormality. VASCULATURE: Mild-to-moderate calcific atheromatous disease within the abdominal aorta. IMPRESSION: 1. No acute findings. 2. Slight levocurvature of the lumbar spine. Electronically signed by: Evalene Coho MD 03/31/2024 03:10 PM EDT RP Workstation: [id number]     Procedures   Medications Ordered in the ED  ibuprofen  (ADVIL ) tablet 600 mg (has no administration in time range)                                    Medical Decision Making This patient presents to the ED for concern of back pain, this involves an extensive number of treatment options, and is a complaint that carries with it a high risk of complications and morbidity.  The differential diagnosis includes fracture, contusion, degenerative disc disease, cauda equina   Co morbidities that complicate the patient evaluation  DDD, MS   Additional history obtained:  Additional history obtained from record review External records from outside source obtained and reviewed including four most recent emergency department notes from outside facilities   Imaging Studies ordered:  I ordered imaging studies including CT lumbar spine  I independently visualized and interpreted imaging which showed 1. No acute findings. 2. Slight levocurvature of the lumbar spine.  I agree with the radiologist interpretation   Cardiac Monitoring: / EKG:  The patient was maintained on a cardiac monitor.  I personally viewed and interpreted the cardiac monitored which showed an underlying rhythm of: NSR   Problem List / ED Course / Critical interventions / Medication management  I ordered medication including ibuprofen  for pain Reevaluation of the patient after these medicines showed that the patient stayed the same I have reviewed the patients home medicines and have made adjustments as needed   Social Determinants of Health:  Former tobacco use, depression, unmet transportation needs, housing  instability   Test / Admission - Considered:  Physical exam is notable as above, neurological exam is reassuring. Patient is not demonstrating any back pain red flags, however she did have a known fall approximately 1.5 weeks ago and pain has persisted since then, x-ray imaging was negative on 8/12 at an outside facility, however she has not had any CT imaging done of her lumbar spine.  She was tachycardic at time of presentation, however she tells me that she took her metoprolol  right before presenting to the emergency department today, upon recheck she is at a normal rate, I suspect that her tachycardia today may be secondary to pain or just baseline tachycardia as she tells me this is normal for me.  I feel that CT imaging is reasonable, see above for results.  Patient tells me that she does not want to take anything for pain aside from Tylenol /ibuprofen , I feel that this is reasonable.  Patient is  very tearful at time of my exam, it seems that she suffers from a great deal of health-related anxiety, as this is her fifth emergency department visit this month for similar complaints.  I offered her multiple different medications for better control of her back pain, including steroids, muscle relaxers, and opioid pain medications, patient tells me that she has many medication allergies and she is very hesitant to take any new medications or simply states they will not work for me, however she tells me she has used lidocaine  patches in the past with some relief of her symptoms.  I will prescribe lidocaine  patches, I encouraged her to continue Tylenol /ibuprofen .  Patient mentions multiple times that only MRIs have diagnosed me in the past, I reiterated with the patient that we do not have MRI imaging available at this facility at this time, and I do not feel that she would benefit from an MRI today given that she does not have any radicular type symptoms nor does she have any red flag back pain  signs/symptoms.  I discussed in depth with the patient about her symptoms today and advised that she may benefit from orthopedic follow-up, she is in agreement with this plan.  Amount and/or Complexity of Data Reviewed Radiology: ordered.  Risk Prescription drug management.        Final diagnoses:  Midline low back pain without sciatica, unspecified chronicity    ED Discharge Orders          Ordered    lidocaine  (LIDODERM ) 5 %  Every 24 hours        03/31/24 1557               Glendia Rocky SAILOR, PA-C 03/31/24 1604    Ula Prentice SAUNDERS, MD 04/01/24 1447

## 2024-03-31 NOTE — ED Triage Notes (Addendum)
 Clemens she states she doesn't remember when Back pain that shoots into her head and spine , has multiple complaints  dizzy at times

## 2024-03-31 NOTE — Discharge Instructions (Signed)
 I have provided you with the contact information for an orthopedic specialist, please follow-up with them in regard to your back pain.  Follow-up with your primary care provider.  Continue ibuprofen /Tylenol  as needed for pain.  Continue lidocaine  patches as needed for pain.  Return to the emergency department if your symptoms worsen.

## 2024-04-01 ENCOUNTER — Other Ambulatory Visit: Payer: Self-pay

## 2024-04-01 DIAGNOSIS — R0602 Shortness of breath: Secondary | ICD-10-CM | POA: Diagnosis not present

## 2024-04-01 DIAGNOSIS — R002 Palpitations: Secondary | ICD-10-CM | POA: Diagnosis not present

## 2024-04-01 DIAGNOSIS — R42 Dizziness and giddiness: Secondary | ICD-10-CM | POA: Diagnosis not present

## 2024-04-01 DIAGNOSIS — I10 Essential (primary) hypertension: Secondary | ICD-10-CM | POA: Diagnosis not present

## 2024-04-05 DIAGNOSIS — R002 Palpitations: Secondary | ICD-10-CM | POA: Diagnosis not present

## 2024-04-05 DIAGNOSIS — R000 Tachycardia, unspecified: Secondary | ICD-10-CM | POA: Diagnosis not present

## 2024-04-05 DIAGNOSIS — F430 Acute stress reaction: Secondary | ICD-10-CM | POA: Diagnosis not present

## 2024-04-07 DIAGNOSIS — Z888 Allergy status to other drugs, medicaments and biological substances status: Secondary | ICD-10-CM | POA: Diagnosis not present

## 2024-04-07 DIAGNOSIS — R7989 Other specified abnormal findings of blood chemistry: Secondary | ICD-10-CM | POA: Diagnosis not present

## 2024-04-07 DIAGNOSIS — G35 Multiple sclerosis: Secondary | ICD-10-CM | POA: Diagnosis not present

## 2024-04-07 DIAGNOSIS — R000 Tachycardia, unspecified: Secondary | ICD-10-CM | POA: Diagnosis not present

## 2024-04-07 DIAGNOSIS — Z91041 Radiographic dye allergy status: Secondary | ICD-10-CM | POA: Diagnosis not present

## 2024-04-07 DIAGNOSIS — Z885 Allergy status to narcotic agent status: Secondary | ICD-10-CM | POA: Diagnosis not present

## 2024-04-07 DIAGNOSIS — F32A Depression, unspecified: Secondary | ICD-10-CM | POA: Diagnosis not present

## 2024-04-07 DIAGNOSIS — E119 Type 2 diabetes mellitus without complications: Secondary | ICD-10-CM | POA: Diagnosis not present

## 2024-04-07 DIAGNOSIS — J45909 Unspecified asthma, uncomplicated: Secondary | ICD-10-CM | POA: Diagnosis not present

## 2024-04-07 DIAGNOSIS — Z79899 Other long term (current) drug therapy: Secondary | ICD-10-CM | POA: Diagnosis not present

## 2024-04-07 DIAGNOSIS — I10 Essential (primary) hypertension: Secondary | ICD-10-CM | POA: Diagnosis not present

## 2024-04-07 DIAGNOSIS — R519 Headache, unspecified: Secondary | ICD-10-CM | POA: Diagnosis not present

## 2024-04-07 DIAGNOSIS — Z881 Allergy status to other antibiotic agents status: Secondary | ICD-10-CM | POA: Diagnosis not present

## 2024-04-07 DIAGNOSIS — R079 Chest pain, unspecified: Secondary | ICD-10-CM | POA: Diagnosis not present

## 2024-04-07 DIAGNOSIS — R61 Generalized hyperhidrosis: Secondary | ICD-10-CM | POA: Diagnosis not present

## 2024-04-07 DIAGNOSIS — E785 Hyperlipidemia, unspecified: Secondary | ICD-10-CM | POA: Diagnosis not present

## 2024-04-07 DIAGNOSIS — Z87891 Personal history of nicotine dependence: Secondary | ICD-10-CM | POA: Diagnosis not present

## 2024-04-07 DIAGNOSIS — R109 Unspecified abdominal pain: Secondary | ICD-10-CM | POA: Diagnosis not present

## 2024-04-07 DIAGNOSIS — I499 Cardiac arrhythmia, unspecified: Secondary | ICD-10-CM | POA: Diagnosis not present

## 2024-04-07 DIAGNOSIS — F419 Anxiety disorder, unspecified: Secondary | ICD-10-CM | POA: Diagnosis not present

## 2024-04-07 DIAGNOSIS — Z7984 Long term (current) use of oral hypoglycemic drugs: Secondary | ICD-10-CM | POA: Diagnosis not present

## 2024-04-07 DIAGNOSIS — Z886 Allergy status to analgesic agent status: Secondary | ICD-10-CM | POA: Diagnosis not present

## 2024-04-08 ENCOUNTER — Ambulatory Visit (INDEPENDENT_AMBULATORY_CARE_PROVIDER_SITE_OTHER): Admitting: Internal Medicine

## 2024-04-08 ENCOUNTER — Encounter: Payer: Self-pay | Admitting: Internal Medicine

## 2024-04-08 VITALS — BP 128/88 | HR 89 | Temp 98.3°F | Resp 24

## 2024-04-08 DIAGNOSIS — K219 Gastro-esophageal reflux disease without esophagitis: Secondary | ICD-10-CM | POA: Diagnosis not present

## 2024-04-08 DIAGNOSIS — R058 Other specified cough: Secondary | ICD-10-CM

## 2024-04-08 DIAGNOSIS — J4530 Mild persistent asthma, uncomplicated: Secondary | ICD-10-CM | POA: Diagnosis not present

## 2024-04-08 DIAGNOSIS — I4729 Other ventricular tachycardia: Secondary | ICD-10-CM | POA: Diagnosis not present

## 2024-04-08 DIAGNOSIS — R002 Palpitations: Secondary | ICD-10-CM | POA: Diagnosis not present

## 2024-04-08 MED ORDER — QVAR REDIHALER 40 MCG/ACT IN AERB: INHALATION_SPRAY | RESPIRATORY_TRACT | 5 refills | Status: AC

## 2024-04-08 MED ORDER — LEVALBUTEROL TARTRATE 45 MCG/ACT IN AERO: 1.0000 | INHALATION_SPRAY | RESPIRATORY_TRACT | 1 refills | Status: AC | PRN

## 2024-04-08 MED ORDER — AZELASTINE HCL 0.1 % NA SOLN
2.0000 | Freq: Two times a day (BID) | NASAL | 5 refills | Status: AC | PRN
Start: 2024-04-08 — End: ?

## 2024-04-08 MED ORDER — FLUTICASONE PROPIONATE 50 MCG/ACT NA SUSP: 1.0000 | Freq: Every day | NASAL | 6 refills | Status: DC

## 2024-04-08 NOTE — Progress Notes (Signed)
 FOLLOW UP Date of Service/Encounter:   04/08/2024  Subjective:  Jennifer [name] (DOB: 04/19/1966) is a 58 y.o. female who [facility] in re-evaluation of the following: asthma flare History obtained from: chart review and patient.  For Review, LV was on 08/23/23  with Dr.Kerensa Nicklas seen for acute visit for asthma flare. See below for summary of history and diagnostics.  On review of her records it appears that she has been to the emergency room a total of 8 times, with most recent visit yesterday. Yesterday reported heart palpatations CXR yesterday normal. ----------------------------------------------------- Pertinent History/Diagnostics:  Allergy  testing to select foods was obtained 03/19/23 and was negative to peanuts tree nuts, coconut, green pepper, cantaloupe, watermelon and garlic Labs were also obtained and were negative to green bell pepper, watermelon, cantaloupe, garlic, peanut and tree nuts. She is well-known to our clinic, seen most frequently for shortness of breath.  She has multiple medication intolerances and cannot tolerate ICS as well as most allergy  medications.  She has a complex medical history. uses Xopenex  infrequently.  She is followed by gastroenterology as well as ENT.  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  She has a history of reflux that has been difficult to manage. She is seen often at the emergency department for symptoms of difficulty breathing or concerns with her heart.  She has hypertension and has had issues with antihypertensive drugs.  She has also had issues with tachycardia and irregular heartbeats. --------------------------------------------------- Today presents for follow-up. Discussed the use of AI scribe software for clinical note transcription with the patient, who gave verbal consent to proceed.  History of Present Illness Jennifer [name] is a 58 year old female with asthma who presents  with concerns of asthma exacerbation and respiratory symptoms.  Asthma exacerbation and lower respiratory symptoms - Experiencing a flare-up of asthma symptoms with coughing to the point of vomiting thick mucus - Difficulty breathing, especially in environments with poor air quality, heat, or exposure to mold and certain chemicals - Oxygen  saturation drops to 93-94% during episodes of respiratory distress, improving with treatment - Describes sensation of lungs feeling 'big again' and straining to breathe - Asthma symptoms are worse in the heat - Xopenex  provides partial and inconsistent relief, with onset of effect after about an hour - Inhaler use is only partially effective, especially in poor air quality - History of COVID-19 with post-infection oxygen  requirement for a couple of months; respiratory symptoms have worsened since then, described as 'crazy worse'  Upper airway dysfunction and dyspnea - 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, 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 - Persistent sensation of straining to breathe despite improved breathing test results - Scheduled to follow up with ENT in early September - Experiences upper airway spasms  Environmental and allergen sensitivities - Exposure to mold and certain chemicals triggers respiratory symptoms and headaches - Currently searching for new housing due to unsuitable environments with mold and poor air quality  Medication use and side effects - Uses Qvar  intermittently due to concerns about developing thrush, despite finding it effective for breathing - Gargles with warm water  after Qvar  use to mitigate risk of thrush - Uses Flonase  and azelastine  for nasal symptoms, but perceives decreasing effectiveness over time - Occasionally uses Afrin for nasal congestion, cautious of potential rebound congestion  Gastroesophageal reflux and associated symptoms - On medication for reflux - unclear if fully  controlled  All medications reviewed by clinical  staff and updated in chart. No new pertinent medical or surgical history except as noted in HPI.  ROS: All others negative except as noted per HPI.   Objective:  BP 128/88 (BP Location: Right Arm, Patient Position: Sitting, Cuff Size: Large)   Pulse 89   Temp 98.3 F (36.8 C) (Oral)   Resp (!) 24   LMP 06/25/2013   SpO2 98%  There is no height or weight on file to calculate BMI. Physical Exam: General Appearance:  Alert, cooperative, no distress, appears stated age  Head:  Normocephalic, without obvious abnormality, atraumatic  Eyes:  Conjunctiva clear, EOM's intact  Ears EACs normal bilaterally and normal TMs bilaterally  Nose: Nares normal, hypertrophic turbinates, normal mucosa, and no visible anterior polyps  Throat: Lips, tongue normal; teeth and gums normal, normal posterior oropharynx  Neck: Supple, symmetrical  Lungs:   clear to auscultation bilaterally, Respirations unlabored, intermittent dry coughing  Heart:  regular rate and rhythm and no murmur, Appears well perfused  Extremities: No edema  Skin: Skin color, texture, turgor normal and no rashes or lesions on visualized portions of skin  Neurologic: No gross deficits   Labs:  Lab Orders  No laboratory test(s) ordered today    Spirometry:  Tracings reviewed. Her effort: Good reproducible efforts. FVC: 2.26L FEV1: 1.99L, 83% predicted FEV1/FVC ratio: 0.88 Interpretation: Spirometry consistent with normal pattern.  Please see scanned spirometry results for details.  Assessment/Plan   Breathing looks great today and lungs sound clear without wheezing.  No concern for severe asthma flare. Reviewed her medications and how to use them. Environmental triggers and irritants may have increased her upper airway symptoms-and discussed need for nasal sprays for help in reducing symptoms. Encouraged ENT follow-up. Cough heard on exam upper airway in nature. Discussed role  of uncontrolled reflux and drainage in these symptoms.   Asthma-at goal Your breathing test looks great today.  Continue Xopenex  2 puffs once every 4-6 hours if needed for cough or wheeze You may use Xopenex  2 puffs 5 to 15 minutes before activity to decrease cough or wheeze For asthma flare begin Qvar  40-2 puffs once a day for 1-2 weeks or until cough and wheeze free. Rinse mouth out after Discussed that tachycardia is not a common side effect with Qvar .  Inhaled corticosteroids are the common treatment for asthma and the the option that we have to use due to your allergies and side effects to different asthma medications.  If you do not wish to follow our recommendations I would consider establishing care with a another asthma specialist.  Upper airway cough syndrome-not at goal Continue to avoid known triggers Continue Flonase  2 sprays in each nostril once a day if needed for stuffy nose Continue azelastine  2 sprays in each nostril up to twice a day if needed for nasal symptoms Consider saline nasal rinses as needed for nasal symptoms. Use this before any medicated nasal sprays for best result Continue to follow dietary and lifestyle modifications as listed below to control your reflux Continue to follow-up with your GI doctor for evaluation and management of reflux Continue to follow-up with ENT for evaluation of your upper airway cough and vocal cord syndrome  Reflux-not at goal Continue dietary and lifestyle modifications as listed below Continue famotidine  20 mg once a day to control reflux  Call the clinic if this treatment plan is not working well for you.  Follow up in 6 months or sooner if needed.  Other: none  Rocky Endow, MD  [facility] 

## 2024-04-08 NOTE — Patient Instructions (Addendum)
 Asthma Your breathing test looks great today.  Continue Xopenex  2 puffs once every 4-6 hours if needed for cough or wheeze You may use Xopenex  2 puffs 5 to 15 minutes before activity to decrease cough or wheeze For asthma flare begin Qvar  40-2 puffs once a day for 1-2 weeks or until cough and wheeze free. Rinse mouth out after Discussed that tachycardia is not a common side effect with Qvar .  Inhaled corticosteroids are the common treatment for asthma and the the option that we have to use due to your allergies and side effects to different asthma medications.  If you do not wish to follow our recommendations I would consider establishing care with a another asthma specialist.  Upper airway cough syndrome Continue to avoid known triggers Continue Flonase  2 sprays in each nostril once a day if needed for stuffy nose Continue azelastine  2 sprays in each nostril up to twice a day if needed for nasal symptoms Consider saline nasal rinses as needed for nasal symptoms. Use this before any medicated nasal sprays for best result Continue to follow dietary and lifestyle modifications as listed below to control your reflux Continue to follow-up with your GI doctor for evaluation and management of reflux Continue to follow-up with ENT for evaluation of your upper airway cough and vocal cord syndrome  Reflux Continue dietary and lifestyle modifications as listed below Continue famotidine  20 mg once a day to control reflux  Call the clinic if this treatment plan is not working well for you.  Follow up in 6 months or sooner if needed.  Gastroesophageal Reflux Induced Respiratory Disease and Laryngopharyngeal Reflux (LPR): Gastroesophageal reflux disease (GERD) is a condition where the contents of the stomach reflux or back up into the esophagus or swallowing tube.  This can result in a variety of clinical symptoms including classic symptoms and atypical symptoms.  Classic symptoms of GERD include:  heartburn, chest pain, acid taste in the mouth, and difficulty in swallowing.  Atypical symptoms of GERD include laryngopharyngeal reflux (LPR) and asthma.  LPR occurs when stomach reflux comes all the way up to the throat.  Clinical symptoms include hoarseness, raspy voice, laryngitis, throat clearing, postnasal drip, mucus stuck in the throat, a sensation of a lump in the throat, sore throat, and cough.  Most patients with LPR do not have classic symptoms of GERD.  Asthma can also be triggered by GERD.  The acid stomach fluid can stimulate nerve fibers in the esophagus which can cause an increase in bronchial muscle tone and narrowing of the airways.  Acid stomach contents may also reflux into the trachea and bronchi of the lungs where it can trigger an asthma attack.  Many people with GERD triggered asthma do not have classic symptoms of GERD.  Diagnosis of LPR and GERD induced asthma is frequently made from a typical history and response to medications.  It may take several months of medications to see a good response.  Occasionally, a 24-hour esophageal pH probe study must be performed.  Treatment of GERD/LPR includes:   Modification of diet and lifestyle Stop smoking Avoid overeating and lose weight Avoid acidic and fatty foods, chocolate, onions, garlic, peppermint Elevate the head of your bed 6 to 8 inches with blocks or wedge Medications Zantac , Pepcid , Axid, Tagamet  Prilosec, Prevacid , Aciphex, Protonix , Nexium Surgery

## 2024-04-09 ENCOUNTER — Telehealth (INDEPENDENT_AMBULATORY_CARE_PROVIDER_SITE_OTHER): Admitting: Family Medicine

## 2024-04-09 ENCOUNTER — Encounter: Payer: Self-pay | Admitting: Family Medicine

## 2024-04-09 DIAGNOSIS — I10 Essential (primary) hypertension: Secondary | ICD-10-CM | POA: Diagnosis not present

## 2024-04-09 DIAGNOSIS — J4520 Mild intermittent asthma, uncomplicated: Secondary | ICD-10-CM | POA: Diagnosis not present

## 2024-04-09 DIAGNOSIS — Z59819 Housing instability, housed unspecified: Secondary | ICD-10-CM

## 2024-04-09 DIAGNOSIS — J029 Acute pharyngitis, unspecified: Secondary | ICD-10-CM | POA: Diagnosis not present

## 2024-04-09 NOTE — Progress Notes (Signed)
 Virtual Visit via Video Note  I connected with Jennifer [name] on 04/09/24 at  9:10 AM EDT by a video enabled telemedicine application and verified that I am speaking with the correct person using two identifiers.   I discussed the limitations of evaluation and management by telemedicine and the availability of in person appointments. The patient expressed understanding and agreed to proceed.  Patient location: at home Provider location: in office  Subjective:    CC:  No chief complaint on file.   HPI: She has a lot going on right now still looking for a new apartment in fact they visited 5 places yesterday.  She has been really disappointed in the quality of apartments that are available in her price range.  She says that several of them had really strong smells and odors and it started to actually trigger some breathing problems she started to feel really short of breath.  She called her asthma and allergy  specialist to try to get in and they were able to see her same day.  She says they did a breathing test and she actually did okay on it.  But was still feeling short of breath and hearing a lot of extra noise with her breathing.  They have referred her to ENT and she actually has an appointment on September 3 to look more at her upper airway which they think is contributing to some of the shortness of breath issues.  Today she woke up with a sore throat and a little bit of drainage and cough.  She says she is using her Astelin .  She has been having difficulties with her blood pressure going up and down.  Yesterday at the asthma and allergy  office though it was great it was 128/88.   With a lot of the stressors of having to leave her current apartment which she enjoys being in and trying to find a new place to live pretty quickly she has been stressed.  She was able to reconnect with her therapist that she was seeing pro bono before and does have an appointment with her in about 2  weeks.  Emotionally she is just felt really overwhelmed and exhausted.  She has been waking up feeling really anxious.  More recently she has been struggling with left hip pain she has been dealing with her knee and has been seeing orthopedist for that but more recently she has had pain in the groin crease especially when she lays on that hip at night.  Past medical history, Surgical history, Family history not pertinant except as noted below, Social history, Allergies, and medications have been entered into the medical record, reviewed, and corrections made.    Objective:    General: Speaking clearly in complete sentences without any shortness of breath.  Alert and oriented x3.  Normal judgment. No apparent acute distress.    Impression and Recommendations:    Problem List Items Addressed This Visit       Cardiovascular and Mediastinum   Low-renin essential hypertension   Blood pressures have been up and down in fact she did recently have an ED visit after having a significantly elevated blood pressure level.  Continue current regimen she has had multiple intolerances.        Respiratory   Mild intermittent asthma without complication   Glad she was able to get in with asthma and allergy  yesterday when she was feeling increased shortness of breath.  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      Other Visit Diagnoses       Housing insecurity    -  Primary     Sorethroat          Sore throat and increased cough and postnasal drainage today is probably from the flare and exacerbation yesterday it sounds like she got around some allergens.  Continue with Flonase  or Nasonex  or the Astelin  if she has that at home could consider an oral  antihistamine as well.  Continue with nasal saline irrigation.  If not improving over the next week please let us  know.   No orders of the defined types were placed in this encounter.   Housing insecurity-hopefully something will come up that will be a good fit that they will be able to get into some of them do have wait list so that may be challenging as she does have to be out of her apartment soon.  No orders of the defined types were placed in this encounter.    I discussed the assessment and treatment plan with the patient. The patient was provided an opportunity to ask questions and all were answered. The patient agreed with the plan and demonstrated an understanding of the instructions.   The patient was advised to call back or seek an in-person evaluation if the symptoms worsen or if the condition fails to improve as anticipated.   Dorothyann Byars, MD

## 2024-04-09 NOTE — Assessment & Plan Note (Signed)
 Glad she was able to get in with asthma and allergy  yesterday when she was feeling increased shortness of breath.  It sounds like she still had pretty good airflow so will be getting further workup for upper airway disorder which she has a prior diagnosis of in fact she actually used to have Botox injections for some of her vocal cord dysfunction and chronic cough related to upper airway issues.

## 2024-04-09 NOTE — Assessment & Plan Note (Signed)
 Blood pressures have been up and down in fact she did recently have an ED visit after having a significantly elevated blood pressure level.  Continue current regimen she has had multiple intolerances.

## 2024-04-10 ENCOUNTER — Encounter: Payer: Self-pay | Admitting: Family Medicine

## 2024-04-10 DIAGNOSIS — R0600 Dyspnea, unspecified: Secondary | ICD-10-CM | POA: Diagnosis not present

## 2024-04-10 DIAGNOSIS — R42 Dizziness and giddiness: Secondary | ICD-10-CM | POA: Diagnosis not present

## 2024-04-10 DIAGNOSIS — R0602 Shortness of breath: Secondary | ICD-10-CM | POA: Diagnosis not present

## 2024-04-10 LAB — LAB REPORT - SCANNED

## 2024-04-11 DIAGNOSIS — R1032 Left lower quadrant pain: Secondary | ICD-10-CM | POA: Diagnosis not present

## 2024-04-11 DIAGNOSIS — R457 State of emotional shock and stress, unspecified: Secondary | ICD-10-CM | POA: Diagnosis not present

## 2024-04-11 DIAGNOSIS — R1310 Dysphagia, unspecified: Secondary | ICD-10-CM | POA: Diagnosis not present

## 2024-04-11 DIAGNOSIS — R110 Nausea: Secondary | ICD-10-CM | POA: Diagnosis not present

## 2024-04-11 DIAGNOSIS — R1111 Vomiting without nausea: Secondary | ICD-10-CM | POA: Diagnosis not present

## 2024-04-12 DIAGNOSIS — R1032 Left lower quadrant pain: Secondary | ICD-10-CM | POA: Diagnosis not present

## 2024-04-12 LAB — LAB REPORT - SCANNED

## 2024-04-15 ENCOUNTER — Telehealth: Payer: Self-pay | Admitting: Family Medicine

## 2024-04-15 NOTE — Telephone Encounter (Signed)
 Patient wants you look her recent labs and send her a Clinical cytogeneticist message.

## 2024-04-16 ENCOUNTER — Encounter (HOSPITAL_BASED_OUTPATIENT_CLINIC_OR_DEPARTMENT_OTHER): Payer: Self-pay | Admitting: Emergency Medicine

## 2024-04-16 ENCOUNTER — Ambulatory Visit: Payer: Self-pay | Admitting: Family Medicine

## 2024-04-16 ENCOUNTER — Emergency Department (HOSPITAL_BASED_OUTPATIENT_CLINIC_OR_DEPARTMENT_OTHER)

## 2024-04-16 ENCOUNTER — Emergency Department (HOSPITAL_BASED_OUTPATIENT_CLINIC_OR_DEPARTMENT_OTHER)
Admission: EM | Admit: 2024-04-16 | Discharge: 2024-04-16 | Disposition: A | Discharge status: Home / Self Care | Attending: Emergency Medicine | Admitting: Emergency Medicine

## 2024-04-16 ENCOUNTER — Other Ambulatory Visit: Payer: Self-pay

## 2024-04-16 DIAGNOSIS — R55 Syncope and collapse: Secondary | ICD-10-CM | POA: Diagnosis not present

## 2024-04-16 DIAGNOSIS — R200 Anesthesia of skin: Secondary | ICD-10-CM | POA: Diagnosis not present

## 2024-04-16 DIAGNOSIS — Z87891 Personal history of nicotine dependence: Secondary | ICD-10-CM | POA: Insufficient documentation

## 2024-04-16 DIAGNOSIS — Z7984 Long term (current) use of oral hypoglycemic drugs: Secondary | ICD-10-CM | POA: Insufficient documentation

## 2024-04-16 DIAGNOSIS — R110 Nausea: Secondary | ICD-10-CM | POA: Diagnosis not present

## 2024-04-16 DIAGNOSIS — R29818 Other symptoms and signs involving the nervous system: Secondary | ICD-10-CM | POA: Diagnosis not present

## 2024-04-16 DIAGNOSIS — Z8542 Personal history of malignant neoplasm of other parts of uterus: Secondary | ICD-10-CM | POA: Diagnosis not present

## 2024-04-16 DIAGNOSIS — I10 Essential (primary) hypertension: Secondary | ICD-10-CM | POA: Diagnosis not present

## 2024-04-16 DIAGNOSIS — J45909 Unspecified asthma, uncomplicated: Secondary | ICD-10-CM | POA: Diagnosis not present

## 2024-04-16 DIAGNOSIS — Z7951 Long term (current) use of inhaled steroids: Secondary | ICD-10-CM | POA: Diagnosis not present

## 2024-04-16 DIAGNOSIS — Z79899 Other long term (current) drug therapy: Secondary | ICD-10-CM | POA: Diagnosis not present

## 2024-04-16 DIAGNOSIS — R42 Dizziness and giddiness: Secondary | ICD-10-CM | POA: Diagnosis not present

## 2024-04-16 DIAGNOSIS — Z90710 Acquired absence of both cervix and uterus: Secondary | ICD-10-CM | POA: Diagnosis not present

## 2024-04-16 DIAGNOSIS — R064 Hyperventilation: Secondary | ICD-10-CM | POA: Diagnosis not present

## 2024-04-16 DIAGNOSIS — R202 Paresthesia of skin: Secondary | ICD-10-CM | POA: Diagnosis not present

## 2024-04-16 DIAGNOSIS — E119 Type 2 diabetes mellitus without complications: Secondary | ICD-10-CM | POA: Insufficient documentation

## 2024-04-16 LAB — COMPREHENSIVE METABOLIC PANEL WITH GFR
ALT: 21 U/L (ref 0–44)
AST: 19 U/L (ref 15–41)
Albumin: 4 g/dL (ref 3.5–5.0)
Alkaline Phosphatase: 83 U/L (ref 38–126)
Anion gap: 11 (ref 5–15)
BUN: 13 mg/dL (ref 6–20)
CO2: 26 mmol/L (ref 22–32)
Calcium: 9 mg/dL (ref 8.9–10.3)
Chloride: 104 mmol/L (ref 98–111)
Creatinine, Ser: 0.63 mg/dL (ref 0.44–1.00)
GFR, Estimated: >60 mL/min (ref >60)
Glucose, Bld: 136 mg/dL — ABNORMAL HIGH (ref 70–99)
Potassium: 4 mmol/L (ref 3.5–5.1)
Sodium: 140 mmol/L (ref 135–145)
Total Bilirubin: 0.3 mg/dL (ref 0.0–1.2)
Total Protein: 6.7 g/dL (ref 6.5–8.1)

## 2024-04-16 LAB — URINALYSIS, ROUTINE W REFLEX MICROSCOPIC
Bilirubin Urine: NEGATIVE
Glucose, UA: NEGATIVE mg/dL
Hgb urine dipstick: NEGATIVE
Ketones, ur: NEGATIVE mg/dL
Leukocytes,Ua: NEGATIVE
Nitrite: NEGATIVE
Protein, ur: NEGATIVE mg/dL
Specific Gravity, Urine: 1.02 (ref 1.005–1.030)
pH: 6 (ref 5.0–8.0)

## 2024-04-16 LAB — CBC WITH DIFFERENTIAL/PLATELET
Abs Immature Granulocytes: 0.04 K/uL (ref 0.00–0.07)
Basophils Absolute: 0 K/uL (ref 0.0–0.1)
Basophils Relative: 1 %
Eosinophils Absolute: 0.2 K/uL (ref 0.0–0.5)
Eosinophils Relative: 3 %
HCT: 42.7 % (ref 36.0–46.0)
Hemoglobin: 13.9 g/dL (ref 12.0–15.0)
Immature Granulocytes: 1 %
Lymphocytes Relative: 21 %
Lymphs Abs: 1.4 K/uL (ref 0.7–4.0)
MCH: 28.9 pg (ref 26.0–34.0)
MCHC: 32.6 g/dL (ref 30.0–36.0)
MCV: 88.8 fL (ref 80.0–100.0)
Monocytes Absolute: 0.8 K/uL (ref 0.1–1.0)
Monocytes Relative: 11 %
Neutro Abs: 4.4 K/uL (ref 1.7–7.7)
Neutrophils Relative %: 63 %
Platelets: 180 K/uL (ref 150–400)
RBC: 4.81 MIL/uL (ref 3.87–5.11)
RDW: 14.1 % (ref 11.5–15.5)
WBC: 6.9 K/uL (ref 4.0–10.5)
nRBC: 0 % (ref 0.0–0.2)

## 2024-04-16 LAB — LAB REPORT - SCANNED

## 2024-04-16 LAB — TROPONIN T, HIGH SENSITIVITY
Troponin T High Sensitivity: <15 ng/L (ref 0–19)
Troponin T High Sensitivity: <15 ng/L (ref 0–19)

## 2024-04-16 LAB — LIPASE, BLOOD: Lipase: 39 U/L (ref 11–51)

## 2024-04-16 MED ORDER — SODIUM CHLORIDE 0.9 % IV BOLUS
500.0000 mL | Freq: Once | INTRAVENOUS | Status: AC
  Administered 2024-04-16: 500 mL via INTRAVENOUS

## 2024-04-16 MED ORDER — IBUPROFEN 800 MG PO TABS
800.0000 mg | ORAL_TABLET | Freq: Once | ORAL | Status: AC
  Administered 2024-04-16: 800 mg via ORAL
  Filled 2024-04-16: qty 1

## 2024-04-16 MED ORDER — HYDROMORPHONE HCL 1 MG/ML IJ SOLN
1.0000 mg | Freq: Once | INTRAMUSCULAR | Status: DC
  Filled 2024-04-16: qty 1

## 2024-04-16 MED ORDER — SODIUM CHLORIDE 0.9 % IV SOLN: INTRAVENOUS | Status: DC

## 2024-04-16 NOTE — ED Provider Notes (Addendum)
 [place] [facility] DEPARTMENT AT [facility] Provider Note   CSN: [id number] Arrival date & time: 04/16/24  1532     Patient presents with: Numbness   Jennifer [name] is a 58 y.o. female.   Patient with pre-existing care plan.  For frequent visits.  Patient here today with multiple complaints.  [facility]  No imaging done at that time.  Patient does have a heart monitor on to which monitors her rhythm.  Patient stating that for the last several days she has felt like she could pass out.  She was seen last night for room spinning.  And numbness to the right side of her head and face.  Also has some pain discomfort there and blurred vision.  There was no obvious facial asymmetry no weakness to extremities and no numbness to extremities.  Patient's also had a headache that started yesterday.  Patient does have a history of allergies to 57 medications.  Past medical history significant for atrial tachycardia chronic headaches sleep apnea posttraumatic stress disorder history of seizures as a child low back pain hyperlipidemia asthma hypertension multiple sclerosis sleep apnea endometrial cancer in 2014 diabetes without complication.  Past surgical history sniffer appendectomy breast lump ectomy cardiac catheterization total hysterectomy.  And gallbladder removal.  Patient former smoker quit 1987.  Chart review shows the patient seen frequently at [facility] and also here.  For various complaints.       Prior to Admission medications   Medication Sig Start Date End Date Taking? Authorizing Provider  AMBULATORY NON FORMULARY MEDICATION Medication Name: Standing order for CBC/diff (monthly) x 1 year 02/27/23   Alvan Dorothyann BIRCH, MD  AMBULATORY NON FORMULARY MEDICATION Medication Name: standing order BMP(weekly) CBC,with diff(Monthly) x 1 year [phone] 11/29/23   Alvan Dorothyann BIRCH, MD   azelastine  (ASTELIN ) 0.1 % nasal spray Place 2 sprays into both nostrils 2 (two) times daily as needed for rhinitis. Use in each nostril as directed 04/08/24   Marinda Rocky SAILOR, MD  beclomethasone (QVAR  REDIHALER) 40 MCG/ACT inhaler At onset of respiratory illness/asthma flare: Inhale 2 puffs once daily with spacer for 1-2 weeks or until symptoms resolve. 04/08/24   Marinda Rocky SAILOR, MD  Blood Glucose Monitoring Suppl DEVI 1 each by Does not apply route in the morning, at noon, and at bedtime. May substitute to any manufacturer covered by patient's insurance.[facility] METER 11/02/23   Alvan Dorothyann BIRCH, MD  Continuous Glucose Receiver (FREESTYLE LIBRE 3 READER) DEVI 1 each by Does not apply route daily. Freestyle libre 3 plus 11/02/23   Alvan Dorothyann BIRCH, MD  Continuous Glucose Sensor (FREESTYLE LIBRE 3 SENSOR) MISC 1 each by Does not apply route every 14 (fourteen) days. Freestyle Libre 3 Plus. Place 1 sensor on the skin every 14 days. Use to check glucose continuously 11/02/23   Alvan Dorothyann BIRCH, MD  diazepam  (VALIUM ) 5 MG tablet Take 0.5-1 tablets (2.5-5 mg total) by mouth every 12 (twelve) hours as needed for anxiety. 03/03/24   Alvan Dorothyann BIRCH, MD  EPINEPHrine  (EPIPEN  2-PAK) 0.3 mg/0.3 mL IJ SOAJ injection Inject 0.3 mg into the muscle as needed for anaphylaxis. 10/09/23   Alvan Dorothyann BIRCH, MD  famotidine  (PEPCID ) 40 MG tablet Take 1 tablet (40 mg total) by mouth 2 (two) times daily. 05/04/23 08/02/24  Alvan Dorothyann BIRCH, MD  fluticasone  (FLONASE ) 50 MCG/ACT nasal spray Place 1 spray into both nostrils daily. 04/08/24   Marinda,  Rocky SAILOR, MD  Glucose Blood (BLOOD GLUCOSE TEST STRIPS) STRP 1 each by In Vitro route daily as needed. May substitute to any manufacturer covered by patient's insurance. 10/30/23   Alvan Dorothyann BIRCH, MD  glucose blood (ONETOUCH VERIO) test strip Check glucose up to 3 times daily. 01/11/24   Alvan Dorothyann BIRCH, MD  Lancets [facility] DELICA PLUS LANCET30G) MISC  Check glucose up to 3 times daily. 01/11/24   Alvan Dorothyann BIRCH, MD  levalbuterol  (XOPENEX  HFA) 45 MCG/ACT inhaler Inhale 1 puff into the lungs every 4 (four) hours as needed for wheezing or shortness of breath. 04/08/24   Marinda Rocky SAILOR, MD  lidocaine  (LIDODERM ) 5 % Place 1 patch onto the skin daily. Remove & Discard patch within 12 hours or as directed by MD 03/31/24   Glendia Rocky SAILOR, PA-C  Metformin  HCl 500 MG/5ML SOLN Take 5 mLs (500 mg total) by mouth 2 (two) times daily with a meal. 01/28/24   Alvan Dorothyann BIRCH, MD  metoprolol  tartrate (LOPRESSOR ) 12.5 mg TABS tablet Take 12.5 mg by mouth 2 (two) times daily.    [provider]  metoprolol  tartrate (LOPRESSOR ) 50 MG tablet Take 50 mg by mouth 2 (two) times daily. 12/05/23   [provider]  potassium chloride  (KLOR-CON ) 20 MEQ packet Take 20 mEq by mouth daily. 10/09/23   Alvan Dorothyann BIRCH, MD  sodium chloride  0.9 % nebulizer solution Take 3 mLs by nebulization as needed for wheezing. 01/18/24   Cottie Donnice PARAS, MD  terbinafine  (LAMISIL ) 1 % cream Apply 1 Application topically 2 (two) times daily. X 6-8 weeks 02/01/24   Alvan Dorothyann BIRCH, MD    Allergies: Azithromycin, Ciprofloxacin , Codeine, Erythromycin base, Sulfa antibiotics, Sulfasalazine, Telmisartan , Ace inhibitors, Aspirin, Atenolol , Avelox  [moxifloxacin  hcl in nacl], Beta adrenergic blockers, Buspar  [buspirone ], Butorphanol tartrate, Cetirizine, Clonidine  hcl, Cortisone, Erythromycin, Fentanyl , Fluoxetine hcl, Ketorolac  tromethamine , Lidocaine , Lisinopril , Metoclopramide hcl, Midazolam , Montelukast, Montelukast sodium, Naproxen, Nitroglycerin , Paroxetine, Penicillins, Pravastatin , Promethazine , Promethazine  hcl, Quinolones, Serotonin reuptake inhibitors (ssris), Sertraline hcl, Stelazine [trifluoperazine], Tobramycin, Trifluoperazine hcl, Atrovent  nasal spray [ipratropium], Diltiazem , Iodinated contrast media, Metoprolol , Polyethylene glycol 3350 , Propoxyphene,  Adhesive [tape], Butorphanol, Ceftriaxone, Iron, Metoclopramide, Metronidazole , Other, Prednisone , Prochlorperazine, Venlafaxine, and Zyrtec [cetirizine hcl]    Review of Systems  Constitutional:  Negative for chills and fever.  HENT:  Negative for ear pain and sore throat.   Eyes:  Negative for pain and visual disturbance.  Respiratory:  Negative for cough and shortness of breath.   Cardiovascular:  Negative for chest pain and palpitations.  Gastrointestinal:  Negative for abdominal pain and vomiting.  Genitourinary:  Negative for dysuria and hematuria.  Musculoskeletal:  Negative for arthralgias and back pain.  Skin:  Negative for color change and rash.  Neurological:  Positive for dizziness, numbness and headaches. Negative for tremors, seizures, syncope, facial asymmetry, speech difficulty, weakness and light-headedness.  All other systems reviewed and are negative.   Updated Vital Signs BP (!) 150/75   Pulse 71   Temp 97.9 F (36.6 C) (Oral)   Resp 11   LMP 06/25/2013   SpO2 95%   Physical Exam Vitals and nursing note reviewed.  Constitutional:      General: She is not in acute distress.    Appearance: Normal appearance. She is well-developed. She is not ill-appearing.  HENT:     Head: Normocephalic and atraumatic.     Mouth/Throat:     Mouth: Mucous membranes are moist.  Eyes:     Extraocular Movements: Extraocular movements intact.  Conjunctiva/sclera: Conjunctivae normal.     Pupils: Pupils are equal, round, and reactive to light.  Cardiovascular:     Rate and Rhythm: Normal rate and regular rhythm.     Heart sounds: No murmur heard. Pulmonary:     Effort: Pulmonary effort is normal. No respiratory distress.     Breath sounds: Normal breath sounds.  Abdominal:     Palpations: Abdomen is soft.     Tenderness: There is no abdominal tenderness.  Musculoskeletal:        General: No swelling.     Cervical back: Normal range of motion and neck supple.  Skin:     General: Skin is warm and dry.     Capillary Refill: Capillary refill takes less than 2 seconds.  Neurological:     General: No focal deficit present.     Mental Status: She is alert and oriented to person, place, and time.     Cranial Nerves: No cranial nerve deficit.     Sensory: No sensory deficit.     Motor: No weakness.     Comments: Questionable numbness to the right side of the face.  No reproducible vertigo.  Psychiatric:        Mood and Affect: Mood normal.     (all labs ordered are listed, but only abnormal results are displayed) Labs Reviewed  COMPREHENSIVE METABOLIC PANEL WITH GFR - Abnormal; Notable for the following components:      Result Value   Glucose, Bld 136 (*)    All other components within normal limits  CBC WITH DIFFERENTIAL/PLATELET  LIPASE, BLOOD  URINALYSIS, ROUTINE W REFLEX MICROSCOPIC  TSH  TROPONIN T, HIGH SENSITIVITY  TROPONIN T, HIGH SENSITIVITY    EKG: EKG Interpretation Date/Time:  Wednesday April 16 2024 15:42:00 EDT Ventricular Rate:  96 PR Interval:  121 QRS Duration:  98 QT Interval:  350 QTC Calculation: 443 R Axis:   4  Text Interpretation: Sinus rhythm Low voltage, precordial leads Confirmed by Geraldene Hamilton [phone]) on 04/16/2024 3:47:29 PM  Radiology: CT Head Wo Contrast Result Date: 04/16/2024 CLINICAL DATA:  Neuro deficit, acute, stroke suspected EXAM: CT HEAD WITHOUT CONTRAST TECHNIQUE: Contiguous axial images were obtained from the base of the skull through the vertex without intravenous contrast. RADIATION DOSE REDUCTION: This exam was performed according to the departmental dose-optimization program which includes automated exposure control, adjustment of the mA and/or kV according to patient size and/or use of iterative reconstruction technique. COMPARISON:  Most recent head CT 02/16/2024, brain MRI 03/29/2024 FINDINGS: Brain: No intracranial hemorrhage, mass effect, or midline shift. Stable subcentimeter dural  calcification along the posterior falx, series 2, image 30. No hydrocephalus. The basilar cisterns are patent. Minor periventricular white matter changes, stable. No evidence of territorial infarct or acute ischemia. No extra-axial or intracranial fluid collection. Vascular: No hyperdense vessel or unexpected calcification. Skull: No fracture or focal lesion. Sinuses/Orbits: Paranasal sinuses and mastoid air cells are clear. The visualized orbits are unremarkable. Other: None. IMPRESSION: No acute intracranial abnormality. Electronically Signed   By: Andrea Gasman M.D.   On: 04/16/2024 18:54   DG Chest Port 1 View Result Date: 04/16/2024 CLINICAL DATA:  Near syncope. EXAM: PORTABLE CHEST 1 VIEW COMPARISON:  Chest radiograph dated 03/04/2024 FINDINGS: No focal consolidation, pleural effusion or pneumothorax. The cardiac silhouette is within normal limits. No acute osseous pathology. IMPRESSION: No active disease. Electronically Signed   By: Vanetta Chou M.D.   On: 04/16/2024 18:41     Procedures  Medications Ordered in the ED  0.9 %  sodium chloride  infusion ( Intravenous New Bag/Given 04/16/24 1759)  HYDROmorphone  (DILAUDID ) injection 1 mg (1 mg Intravenous Not Given 04/16/24 1849)  sodium chloride  0.9 % bolus 500 mL (0 mLs Intravenous Stopped 04/16/24 1906)  ibuprofen  (ADVIL ) tablet 800 mg (800 mg Oral Given 04/16/24 1920)                                    Medical Decision Making Amount and/or Complexity of Data Reviewed Labs: ordered. Radiology: ordered.  Risk Prescription drug management.   Patient just seen at [facility].  Workup just really included labs.  Patient seen quite frequently.  So has a lot of concerns.  Seems like most of today's near syncope may be vertigo and a headache but has a history of chronic headaches.  Get labs we will get head CT will get chest x-ray will give some IV fluids.  Patient may need transfer and for MRI.  But she does have that cardiac  monitor on.  But it can technically be removed.  Will also check TSH   White blood cell count is 6.9 hemoglobin 13.9 platelets 180.  Complete metabolic panel completely normal glucose 136 and renal function normal.  Lipase 39 initial troponin less than 15 TSH is in process.  But may take till tomorrow to come back.  CT head without any acute abnormalities and chest x-ray with no active disease.  Based on this patient could have her Motrin .  Would recommend outpatient MRI but that is up to her primary care doctor.  No urgent reason for MRI tonight.  Urinalysis negative.  Delta troponins x 2 both less than 15 TSH is still pending which can get that off MyChart.   Final diagnoses:  Near syncope    ED Discharge Orders     None          Geraldene Hamilton, MD 04/16/24 1712    Geraldene Hamilton, MD 04/16/24 1712    Geraldene Hamilton, MD 04/16/24 2055

## 2024-04-16 NOTE — Telephone Encounter (Signed)
 Rec inc hydration and make sure bowels are moving.

## 2024-04-16 NOTE — ED Triage Notes (Signed)
 Headache started yesterday , felt the room was spinning  , woke up at noon today  with  numbness to right side of her head and face  and feels different from left side of her face . Dizzy , blurry vision .  No obvious facial asymmetry, no weakness to all extremities , pt ambulated from car to registration with steady gait .   Alert and oriented x 4

## 2024-04-16 NOTE — ED Notes (Signed)
 Patient to room from triage, asked to change into gown for exam and patient refused saying she is allergic and needs a special gown. RN notified.

## 2024-04-16 NOTE — Discharge Instructions (Addendum)
 Normal extensive workup here tonight without acute findings.  Thyroid -stimulating hormone still pending.  Would recommend following up with your regular doctor regarding that.  Will also show up on MyChart.  Also with talk to your regular doctor about a consideration for an outpatient MRI.  No indication for urgent MRI here tonight.  Labs and workup here tonight very reassuring.

## 2024-04-16 NOTE — Progress Notes (Signed)
 Looks better hydrated on these labs.  Magnesium  levels normal.

## 2024-04-17 ENCOUNTER — Encounter: Payer: Self-pay | Admitting: Family Medicine

## 2024-04-17 LAB — TSH: TSH: 1.15 u[IU]/mL (ref 0.350–4.500)

## 2024-04-18 DIAGNOSIS — U071 COVID-19: Secondary | ICD-10-CM | POA: Diagnosis not present

## 2024-04-18 DIAGNOSIS — R0602 Shortness of breath: Secondary | ICD-10-CM | POA: Diagnosis not present

## 2024-04-18 DIAGNOSIS — E119 Type 2 diabetes mellitus without complications: Secondary | ICD-10-CM | POA: Diagnosis not present

## 2024-04-18 DIAGNOSIS — R1310 Dysphagia, unspecified: Secondary | ICD-10-CM | POA: Diagnosis not present

## 2024-04-18 DIAGNOSIS — Z87891 Personal history of nicotine dependence: Secondary | ICD-10-CM | POA: Diagnosis not present

## 2024-04-18 DIAGNOSIS — J069 Acute upper respiratory infection, unspecified: Secondary | ICD-10-CM | POA: Diagnosis not present

## 2024-04-18 DIAGNOSIS — I10 Essential (primary) hypertension: Secondary | ICD-10-CM | POA: Diagnosis not present

## 2024-04-19 DIAGNOSIS — R0600 Dyspnea, unspecified: Secondary | ICD-10-CM | POA: Diagnosis not present

## 2024-04-21 DIAGNOSIS — U071 COVID-19: Secondary | ICD-10-CM | POA: Diagnosis not present

## 2024-04-21 DIAGNOSIS — R059 Cough, unspecified: Secondary | ICD-10-CM | POA: Diagnosis not present

## 2024-04-21 DIAGNOSIS — R1111 Vomiting without nausea: Secondary | ICD-10-CM | POA: Diagnosis not present

## 2024-04-21 DIAGNOSIS — R509 Fever, unspecified: Secondary | ICD-10-CM | POA: Diagnosis not present

## 2024-04-21 DIAGNOSIS — R0602 Shortness of breath: Secondary | ICD-10-CM | POA: Diagnosis not present

## 2024-04-21 DIAGNOSIS — R110 Nausea: Secondary | ICD-10-CM | POA: Diagnosis not present

## 2024-04-23 DIAGNOSIS — R079 Chest pain, unspecified: Secondary | ICD-10-CM | POA: Diagnosis not present

## 2024-04-23 DIAGNOSIS — R531 Weakness: Secondary | ICD-10-CM | POA: Diagnosis not present

## 2024-04-23 DIAGNOSIS — R0789 Other chest pain: Secondary | ICD-10-CM | POA: Diagnosis not present

## 2024-04-23 DIAGNOSIS — I498 Other specified cardiac arrhythmias: Secondary | ICD-10-CM | POA: Diagnosis not present

## 2024-04-23 DIAGNOSIS — R110 Nausea: Secondary | ICD-10-CM | POA: Diagnosis not present

## 2024-04-23 DIAGNOSIS — U071 COVID-19: Secondary | ICD-10-CM | POA: Diagnosis not present

## 2024-04-23 DIAGNOSIS — R42 Dizziness and giddiness: Secondary | ICD-10-CM | POA: Diagnosis not present

## 2024-04-23 DIAGNOSIS — R0602 Shortness of breath: Secondary | ICD-10-CM | POA: Diagnosis not present

## 2024-04-24 DIAGNOSIS — I498 Other specified cardiac arrhythmias: Secondary | ICD-10-CM | POA: Diagnosis not present

## 2024-04-25 ENCOUNTER — Telehealth: Payer: Self-pay

## 2024-04-25 NOTE — Progress Notes (Signed)
   04/25/2024  Patient ID: Jennifer [name] Bald, female   DOB: [birth year], 58 y.o.   MRN: [id number]  This patient is appearing on a report for being at risk of failing the adherence measure for diabetes medications this calendar year.   Medication: metformin  500mg /35ml solution Last fill date: 03/07/24 for 30 day supply  MyChart message sent to patient.  Channing DELENA Mealing, PharmD, DPLA

## 2024-04-27 DIAGNOSIS — J029 Acute pharyngitis, unspecified: Secondary | ICD-10-CM | POA: Diagnosis not present

## 2024-04-27 DIAGNOSIS — H938X1 Other specified disorders of right ear: Secondary | ICD-10-CM | POA: Diagnosis not present

## 2024-04-27 DIAGNOSIS — R5383 Other fatigue: Secondary | ICD-10-CM | POA: Diagnosis not present

## 2024-04-27 DIAGNOSIS — R0789 Other chest pain: Secondary | ICD-10-CM | POA: Diagnosis not present

## 2024-04-27 DIAGNOSIS — Z20822 Contact with and (suspected) exposure to covid-19: Secondary | ICD-10-CM | POA: Diagnosis not present

## 2024-04-27 DIAGNOSIS — R0602 Shortness of breath: Secondary | ICD-10-CM | POA: Diagnosis not present

## 2024-04-28 ENCOUNTER — Encounter: Payer: Self-pay | Admitting: Family Medicine

## 2024-04-28 ENCOUNTER — Telehealth (INDEPENDENT_AMBULATORY_CARE_PROVIDER_SITE_OTHER): Admitting: Family Medicine

## 2024-04-28 DIAGNOSIS — R0602 Shortness of breath: Secondary | ICD-10-CM

## 2024-04-28 DIAGNOSIS — I4729 Other ventricular tachycardia: Secondary | ICD-10-CM | POA: Diagnosis not present

## 2024-04-28 DIAGNOSIS — H65191 Other acute nonsuppurative otitis media, right ear: Secondary | ICD-10-CM | POA: Diagnosis not present

## 2024-04-28 DIAGNOSIS — M25552 Pain in left hip: Secondary | ICD-10-CM | POA: Diagnosis not present

## 2024-04-28 DIAGNOSIS — I10 Essential (primary) hypertension: Secondary | ICD-10-CM | POA: Diagnosis not present

## 2024-04-28 DIAGNOSIS — F331 Major depressive disorder, recurrent, moderate: Secondary | ICD-10-CM | POA: Diagnosis not present

## 2024-04-28 DIAGNOSIS — U071 COVID-19: Secondary | ICD-10-CM

## 2024-04-28 DIAGNOSIS — R002 Palpitations: Secondary | ICD-10-CM | POA: Diagnosis not present

## 2024-04-28 DIAGNOSIS — D500 Iron deficiency anemia secondary to blood loss (chronic): Secondary | ICD-10-CM | POA: Diagnosis not present

## 2024-04-28 DIAGNOSIS — I499 Cardiac arrhythmia, unspecified: Secondary | ICD-10-CM | POA: Diagnosis not present

## 2024-04-28 DIAGNOSIS — I471 Supraventricular tachycardia, unspecified: Secondary | ICD-10-CM | POA: Diagnosis not present

## 2024-04-28 NOTE — Progress Notes (Signed)
 Virtual Visit via Video Note  I connected with Jennifer [name] on 04/28/24 at  1:20 PM EDT by a video enabled telemedicine application and verified that I am speaking with the correct person using two identifiers.   I discussed the limitations of evaluation and management by telemedicine and the availability of in person appointments. The patient expressed understanding and agreed to proceed.  Patient location: at home Provider location: in office  Subjective:    CC:  No chief complaint on file.   HPI: Doing virtual visit today for some persistent symptoms she was actually diagnosed with COVID earlier in August around the fifth.  She has had a cough and has lost her voice.  Some nasal drainage.  She actually started feeling really dizzy and lightheaded and ended up going to the emergency department.  She says the room was spinning she was told that she still had some fluid in her ears.  She went to the ED on September 5, September 8, September 10 and then yesterday.  She says she has just been really struggling to feel better she still had some fevers and chills on and off.  She was given meclizine  for the dizziness.  Yesterday she noticed that her nose and midface were red and she was feeling a little winded while she tried to go out and get a little shopping done.  She had she unfortunately missed her ENT appointment and so we will have to reschedule.  Pulse ox today is 98.  She also had labs drawn at the hospital today and wanted me to take a look at those.  She says her left hip is still really bothering her it is to the point where she really cannot sleep on it sometimes the pain is in the groin crease sometimes it is more on the outside she actually feels like there is a more of a swollen lump currently.  She wants to get in with the orthopedist again but she will likely be moving at the end of the month to [place] so we will probably need to find a new orthopedist.   Past  medical history, Surgical history, Family history not pertinant except as noted below, Social history, Allergies, and medications have been entered into the medical record, reviewed, and corrections made.    Objective:    General: Speaking clearly in complete sentences without any shortness of breath.  Alert and oriented x3.  Normal judgment. No apparent acute distress.    Impression and Recommendations:    Problem List Items Addressed This Visit       Other   Moderate episode of recurrent major depressive disorder (HCC)   As engaged with therapy on and off going through a really stressful time right now having to leave her current apartment and find a new place to live as they are not renewing her lease.      Other Visit Diagnoses       Left hip pain    -  Primary     COVID-19         SOB (shortness of breath)         Acute effusion of right ear          COVID-19 with shortness of breath and cough-it sounds like she is getting a little better she has been to the ED multiple times and been evaluated her pulse ox looks great today I suspect she just has a lot of inflammation she is  starting to get a lot of throat irritation from coughing she says she still feels like there is a little mucus that is not moving.  Just encouraged her not over cough as that can really strain the vocal cords and injure the throat.  Left hip pain-she will really need to get in with a new orthopedist or sports med doc when she gets into the [place] area hopefully everything will work out where she will be able to have an easy transition and move.\  Right ear effusion based on ED notes.  We did discuss continuing to use her Flonase  and that it may take 7 to 10 days to start to notice improvement.  No orders of the defined types were placed in this encounter.   No orders of the defined types were placed in this encounter.   I discussed the assessment and treatment plan with the patient. The patient  was provided an opportunity to ask questions and all were answered. The patient agreed with the plan and demonstrated an understanding of the instructions.   The patient was advised to call back or seek an in-person evaluation if the symptoms worsen or if the condition fails to improve as anticipated.   Dorothyann Byars, MD

## 2024-04-28 NOTE — Assessment & Plan Note (Signed)
 As engaged with therapy on and off going through a really stressful time right now having to leave her current apartment and find a new place to live as they are not renewing her lease.

## 2024-05-01 DIAGNOSIS — J385 Laryngeal spasm: Secondary | ICD-10-CM | POA: Diagnosis not present

## 2024-05-01 DIAGNOSIS — R070 Pain in throat: Secondary | ICD-10-CM | POA: Diagnosis not present

## 2024-05-01 DIAGNOSIS — R1319 Other dysphagia: Secondary | ICD-10-CM | POA: Diagnosis not present

## 2024-05-01 DIAGNOSIS — R1312 Dysphagia, oropharyngeal phase: Secondary | ICD-10-CM | POA: Diagnosis not present

## 2024-05-03 DIAGNOSIS — Z7951 Long term (current) use of inhaled steroids: Secondary | ICD-10-CM | POA: Diagnosis not present

## 2024-05-03 DIAGNOSIS — M5459 Other low back pain: Secondary | ICD-10-CM | POA: Diagnosis not present

## 2024-05-03 DIAGNOSIS — M79671 Pain in right foot: Secondary | ICD-10-CM | POA: Diagnosis not present

## 2024-05-03 DIAGNOSIS — S199XXA Unspecified injury of neck, initial encounter: Secondary | ICD-10-CM | POA: Diagnosis not present

## 2024-05-03 DIAGNOSIS — M76899 Other specified enthesopathies of unspecified lower limb, excluding foot: Secondary | ICD-10-CM | POA: Diagnosis not present

## 2024-05-03 DIAGNOSIS — R100 Acute abdomen: Secondary | ICD-10-CM | POA: Diagnosis not present

## 2024-05-03 DIAGNOSIS — Z87891 Personal history of nicotine dependence: Secondary | ICD-10-CM | POA: Diagnosis not present

## 2024-05-03 DIAGNOSIS — R0789 Other chest pain: Secondary | ICD-10-CM | POA: Diagnosis not present

## 2024-05-03 DIAGNOSIS — M7989 Other specified soft tissue disorders: Secondary | ICD-10-CM | POA: Diagnosis not present

## 2024-05-03 DIAGNOSIS — M25562 Pain in left knee: Secondary | ICD-10-CM | POA: Diagnosis not present

## 2024-05-03 DIAGNOSIS — M546 Pain in thoracic spine: Secondary | ICD-10-CM | POA: Diagnosis not present

## 2024-05-03 DIAGNOSIS — W19XXXA Unspecified fall, initial encounter: Secondary | ICD-10-CM | POA: Diagnosis not present

## 2024-05-03 DIAGNOSIS — M25561 Pain in right knee: Secondary | ICD-10-CM | POA: Diagnosis not present

## 2024-05-03 DIAGNOSIS — M79672 Pain in left foot: Secondary | ICD-10-CM | POA: Diagnosis not present

## 2024-05-03 DIAGNOSIS — R079 Chest pain, unspecified: Secondary | ICD-10-CM | POA: Diagnosis not present

## 2024-05-03 DIAGNOSIS — S0990XA Unspecified injury of head, initial encounter: Secondary | ICD-10-CM | POA: Diagnosis not present

## 2024-05-03 DIAGNOSIS — M79674 Pain in right toe(s): Secondary | ICD-10-CM | POA: Diagnosis not present

## 2024-05-03 DIAGNOSIS — S3992XA Unspecified injury of lower back, initial encounter: Secondary | ICD-10-CM | POA: Diagnosis not present

## 2024-05-03 DIAGNOSIS — Z79899 Other long term (current) drug therapy: Secondary | ICD-10-CM | POA: Diagnosis not present

## 2024-05-03 DIAGNOSIS — R9431 Abnormal electrocardiogram [ECG] [EKG]: Secondary | ICD-10-CM | POA: Diagnosis not present

## 2024-05-03 DIAGNOSIS — R109 Unspecified abdominal pain: Secondary | ICD-10-CM | POA: Diagnosis not present

## 2024-05-03 DIAGNOSIS — M542 Cervicalgia: Secondary | ICD-10-CM | POA: Diagnosis not present

## 2024-05-06 ENCOUNTER — Telehealth: Payer: Self-pay | Admitting: Family Medicine

## 2024-05-06 DIAGNOSIS — C519 Malignant neoplasm of vulva, unspecified: Secondary | ICD-10-CM

## 2024-05-06 NOTE — Telephone Encounter (Signed)
 Referral placed.

## 2024-05-06 NOTE — Telephone Encounter (Signed)
 [place] like [place] [facility] [place] She has recently moved to [facility], this would be the closest office to her. She has been there before but it has been over 2 years. The referral is for Pagets Disease

## 2024-05-07 DIAGNOSIS — M7989 Other specified soft tissue disorders: Secondary | ICD-10-CM | POA: Diagnosis not present

## 2024-05-07 DIAGNOSIS — S90122A Contusion of left lesser toe(s) without damage to nail, initial encounter: Secondary | ICD-10-CM | POA: Diagnosis not present

## 2024-05-07 DIAGNOSIS — M25561 Pain in right knee: Secondary | ICD-10-CM | POA: Diagnosis not present

## 2024-05-07 DIAGNOSIS — M79605 Pain in left leg: Secondary | ICD-10-CM | POA: Diagnosis not present

## 2024-05-07 DIAGNOSIS — M79671 Pain in right foot: Secondary | ICD-10-CM | POA: Diagnosis not present

## 2024-05-07 DIAGNOSIS — Z87891 Personal history of nicotine dependence: Secondary | ICD-10-CM | POA: Diagnosis not present

## 2024-05-07 DIAGNOSIS — M79662 Pain in left lower leg: Secondary | ICD-10-CM | POA: Diagnosis not present

## 2024-05-07 DIAGNOSIS — M25552 Pain in left hip: Secondary | ICD-10-CM | POA: Diagnosis not present

## 2024-05-07 DIAGNOSIS — R1084 Generalized abdominal pain: Secondary | ICD-10-CM | POA: Diagnosis not present

## 2024-05-07 DIAGNOSIS — S80812A Abrasion, left lower leg, initial encounter: Secondary | ICD-10-CM | POA: Diagnosis not present

## 2024-05-07 DIAGNOSIS — M79672 Pain in left foot: Secondary | ICD-10-CM | POA: Diagnosis not present

## 2024-05-07 DIAGNOSIS — R0789 Other chest pain: Secondary | ICD-10-CM | POA: Diagnosis not present

## 2024-05-07 DIAGNOSIS — M1612 Unilateral primary osteoarthritis, left hip: Secondary | ICD-10-CM | POA: Diagnosis not present

## 2024-05-07 DIAGNOSIS — Z23 Encounter for immunization: Secondary | ICD-10-CM | POA: Diagnosis not present

## 2024-05-07 DIAGNOSIS — M542 Cervicalgia: Secondary | ICD-10-CM | POA: Diagnosis not present

## 2024-05-09 DIAGNOSIS — I10 Essential (primary) hypertension: Secondary | ICD-10-CM | POA: Diagnosis not present

## 2024-05-09 LAB — LAB REPORT - SCANNED

## 2024-05-12 ENCOUNTER — Telehealth: Admitting: Family Medicine

## 2024-05-12 VITALS — Wt 227.0 lb

## 2024-05-12 DIAGNOSIS — R079 Chest pain, unspecified: Secondary | ICD-10-CM | POA: Diagnosis not present

## 2024-05-12 DIAGNOSIS — M25561 Pain in right knee: Secondary | ICD-10-CM | POA: Diagnosis not present

## 2024-05-12 DIAGNOSIS — M25562 Pain in left knee: Secondary | ICD-10-CM

## 2024-05-12 DIAGNOSIS — I493 Ventricular premature depolarization: Secondary | ICD-10-CM | POA: Diagnosis not present

## 2024-05-12 DIAGNOSIS — G35 Multiple sclerosis: Secondary | ICD-10-CM | POA: Diagnosis not present

## 2024-05-12 DIAGNOSIS — F331 Major depressive disorder, recurrent, moderate: Secondary | ICD-10-CM | POA: Diagnosis not present

## 2024-05-12 DIAGNOSIS — Z87891 Personal history of nicotine dependence: Secondary | ICD-10-CM | POA: Diagnosis not present

## 2024-05-12 DIAGNOSIS — G8929 Other chronic pain: Secondary | ICD-10-CM | POA: Diagnosis not present

## 2024-05-12 DIAGNOSIS — E1169 Type 2 diabetes mellitus with other specified complication: Secondary | ICD-10-CM | POA: Diagnosis not present

## 2024-05-12 DIAGNOSIS — R072 Precordial pain: Secondary | ICD-10-CM | POA: Diagnosis not present

## 2024-05-12 DIAGNOSIS — M25552 Pain in left hip: Secondary | ICD-10-CM | POA: Diagnosis not present

## 2024-05-12 DIAGNOSIS — G35D Multiple sclerosis, unspecified: Secondary | ICD-10-CM

## 2024-05-12 MED ORDER — AMBULATORY NON FORMULARY MEDICATION: 0 refills | Status: DC

## 2024-05-12 NOTE — Assessment & Plan Note (Addendum)
 Still having some SVT and PVCs.  She has felt her heart fluttering.  Continue metoprolol 

## 2024-05-12 NOTE — Assessment & Plan Note (Signed)
 Should have an in person visit at end fo Oct/ early Nov and will plan to get kidney and foot exam up to date.

## 2024-05-12 NOTE — Progress Notes (Unsigned)
 Virtual Visit via Video Note  I connected with Jennifer [name] on 05/13/24 at  1:20 PM EDT by a video enabled telemedicine application and verified that I am speaking with the correct person using two identifiers.   I discussed the limitations of evaluation and management by telemedicine and the availability of in person appointments. The patient expressed understanding and agreed to proceed.  Patient location: at home Provider location: in office  Subjective:    CC:   Chief Complaint  Patient presents with   Medical Management of Chronic Issues    HPI: Clemens about a week ago on cement and his her left knee, right knee and bruising on toes on the left toes, and neck hurting too.  Went to the ED.  Bruises on arms. Abrasion on the left knee. Went back again and had a doppler for the swelling.   Using mupirocin  ointment.  Upcoming appt with ortho for her knees.    She is now in her new place which is for 55 and up.  New apt.  She got really tearful yesterday with all the stress. Feels like it has been building for week and finally felt a release yesterday and today.    Having some chest pain and follows with Cardiology.   Needs a lightweight Rollator bc of knee and hip pain and imbalance.    BPs have been up since she fell. Still taking her metoprolol  . BP was 177/86 at the ED. SABRA  Upcoming appt with new Onc Amy Wallace in [place] with [facility].     Past medical history, Surgical history, Family history not pertinant except as noted below, Social history, Allergies, and medications have been entered into the medical record, reviewed, and corrections made.    Objective:    General: Speaking clearly in complete sentences without any shortness of breath.  Alert and oriented x3.  Normal judgment. No apparent acute distress.   Impression and Recommendations:    Problem List Items Addressed This Visit       Cardiovascular and Mediastinum   PVC's (premature ventricular  contractions)   Still having some SVT and PVCs.  She has felt her heart fluttering.  Continue metoprolol         Endocrine   Type 2 diabetes mellitus with other specified complication, without long-term current use of insulin (HCC)   Should have an in person visit at end fo Oct/ early Nov and will plan to get kidney and foot exam up to date.         Nervous and Auditory   Multiple sclerosis   Hasn't been reavaluate by Neurology for this condition recently.         Other   Moderate episode of recurrent major depressive disorder (HCC)   Chronic pain of both knees   Bilat knee pain and left hip pain - work on getting a Adult nurse. She has upcoming appt with ortho but not until end of October      Relevant Medications   AMBULATORY NON FORMULARY MEDICATION   Other Visit Diagnoses       Left hip pain    -  Primary   Relevant Medications   AMBULATORY NON FORMULARY MEDICATION      MDD - she felt she couldn't complete PHQ-9 today but would try again at next OV.  Again tearful the last couple of days.   No orders of the defined types were placed in this encounter.   Meds ordered this  encounter  Medications   AMBULATORY NON FORMULARY MEDICATION    Sig: Medication Name: Tax inspector, for female 5'2    Dispense:  1 Units    Refill:  0    I discussed the assessment and treatment plan with the patient. The patient was provided an opportunity to ask questions and all were answered. The patient agreed with the plan and demonstrated an understanding of the instructions.   The patient was advised to call back or seek an in-person evaluation if the symptoms worsen or if the condition fails to improve as anticipated.  I spent 45 minutes on the day of the encounter to include pre-visit record review, face-to-face time with the patient and post visit ordering of test.  Dorothyann Byars, MD

## 2024-05-13 ENCOUNTER — Encounter: Payer: Self-pay | Admitting: Family Medicine

## 2024-05-13 DIAGNOSIS — R9431 Abnormal electrocardiogram [ECG] [EKG]: Secondary | ICD-10-CM | POA: Diagnosis not present

## 2024-05-13 NOTE — Assessment & Plan Note (Signed)
 Bilat knee pain and left hip pain - work on getting a Adult nurse. She has upcoming appt with ortho but not until end of October

## 2024-05-13 NOTE — Assessment & Plan Note (Signed)
 Hasn't been reavaluate by Neurology for this condition recently.

## 2024-05-16 DIAGNOSIS — I10 Essential (primary) hypertension: Secondary | ICD-10-CM | POA: Diagnosis not present

## 2024-05-17 DIAGNOSIS — R079 Chest pain, unspecified: Secondary | ICD-10-CM | POA: Diagnosis not present

## 2024-05-17 LAB — LAB REPORT - SCANNED: EGFR: 85

## 2024-05-18 DIAGNOSIS — Z91041 Radiographic dye allergy status: Secondary | ICD-10-CM | POA: Diagnosis not present

## 2024-05-18 DIAGNOSIS — Z87891 Personal history of nicotine dependence: Secondary | ICD-10-CM | POA: Diagnosis not present

## 2024-05-18 DIAGNOSIS — Z20822 Contact with and (suspected) exposure to covid-19: Secondary | ICD-10-CM | POA: Diagnosis not present

## 2024-05-18 DIAGNOSIS — R519 Headache, unspecified: Secondary | ICD-10-CM | POA: Diagnosis not present

## 2024-05-18 DIAGNOSIS — R0789 Other chest pain: Secondary | ICD-10-CM | POA: Diagnosis not present

## 2024-05-18 DIAGNOSIS — R42 Dizziness and giddiness: Secondary | ICD-10-CM | POA: Diagnosis not present

## 2024-05-18 DIAGNOSIS — R079 Chest pain, unspecified: Secondary | ICD-10-CM | POA: Diagnosis not present

## 2024-05-19 DIAGNOSIS — R079 Chest pain, unspecified: Secondary | ICD-10-CM | POA: Diagnosis not present

## 2024-05-19 DIAGNOSIS — I472 Ventricular tachycardia, unspecified: Secondary | ICD-10-CM | POA: Diagnosis not present

## 2024-05-19 DIAGNOSIS — I10 Essential (primary) hypertension: Secondary | ICD-10-CM | POA: Diagnosis not present

## 2024-05-19 DIAGNOSIS — R000 Tachycardia, unspecified: Secondary | ICD-10-CM | POA: Diagnosis not present

## 2024-05-19 DIAGNOSIS — R7989 Other specified abnormal findings of blood chemistry: Secondary | ICD-10-CM | POA: Diagnosis not present

## 2024-05-20 DIAGNOSIS — R079 Chest pain, unspecified: Secondary | ICD-10-CM | POA: Diagnosis not present

## 2024-05-20 DIAGNOSIS — R0602 Shortness of breath: Secondary | ICD-10-CM | POA: Diagnosis not present

## 2024-05-20 DIAGNOSIS — R7989 Other specified abnormal findings of blood chemistry: Secondary | ICD-10-CM | POA: Diagnosis not present

## 2024-05-20 DIAGNOSIS — I10 Essential (primary) hypertension: Secondary | ICD-10-CM | POA: Diagnosis not present

## 2024-05-24 DIAGNOSIS — Z20822 Contact with and (suspected) exposure to covid-19: Secondary | ICD-10-CM | POA: Diagnosis not present

## 2024-05-24 DIAGNOSIS — Z87891 Personal history of nicotine dependence: Secondary | ICD-10-CM | POA: Diagnosis not present

## 2024-05-24 DIAGNOSIS — R42 Dizziness and giddiness: Secondary | ICD-10-CM | POA: Diagnosis not present

## 2024-05-24 DIAGNOSIS — R0789 Other chest pain: Secondary | ICD-10-CM | POA: Diagnosis not present

## 2024-05-24 DIAGNOSIS — E119 Type 2 diabetes mellitus without complications: Secondary | ICD-10-CM | POA: Diagnosis not present

## 2024-05-24 DIAGNOSIS — E876 Hypokalemia: Secondary | ICD-10-CM | POA: Diagnosis not present

## 2024-05-24 DIAGNOSIS — Z9181 History of falling: Secondary | ICD-10-CM | POA: Diagnosis not present

## 2024-05-24 DIAGNOSIS — E860 Dehydration: Secondary | ICD-10-CM | POA: Diagnosis not present

## 2024-05-24 DIAGNOSIS — R079 Chest pain, unspecified: Secondary | ICD-10-CM | POA: Diagnosis not present

## 2024-05-24 DIAGNOSIS — I10 Essential (primary) hypertension: Secondary | ICD-10-CM | POA: Diagnosis not present

## 2024-05-26 DIAGNOSIS — R296 Repeated falls: Secondary | ICD-10-CM | POA: Diagnosis not present

## 2024-05-26 DIAGNOSIS — M7989 Other specified soft tissue disorders: Secondary | ICD-10-CM | POA: Diagnosis not present

## 2024-05-26 DIAGNOSIS — Z20822 Contact with and (suspected) exposure to covid-19: Secondary | ICD-10-CM | POA: Diagnosis not present

## 2024-05-26 DIAGNOSIS — K2270 Barrett's esophagus without dysplasia: Secondary | ICD-10-CM | POA: Diagnosis not present

## 2024-05-26 DIAGNOSIS — T462X6A Underdosing of other antidysrhythmic drugs, initial encounter: Secondary | ICD-10-CM | POA: Diagnosis not present

## 2024-05-26 DIAGNOSIS — K760 Fatty (change of) liver, not elsewhere classified: Secondary | ICD-10-CM | POA: Diagnosis not present

## 2024-05-26 DIAGNOSIS — I493 Ventricular premature depolarization: Secondary | ICD-10-CM | POA: Diagnosis not present

## 2024-05-26 DIAGNOSIS — G40909 Epilepsy, unspecified, not intractable, without status epilepticus: Secondary | ICD-10-CM | POA: Diagnosis not present

## 2024-05-26 DIAGNOSIS — Z91148 Patient's other noncompliance with medication regimen for other reason: Secondary | ICD-10-CM | POA: Diagnosis not present

## 2024-05-26 DIAGNOSIS — F419 Anxiety disorder, unspecified: Secondary | ICD-10-CM | POA: Diagnosis not present

## 2024-05-26 DIAGNOSIS — M1990 Unspecified osteoarthritis, unspecified site: Secondary | ICD-10-CM | POA: Diagnosis not present

## 2024-05-26 DIAGNOSIS — I471 Supraventricular tachycardia, unspecified: Secondary | ICD-10-CM | POA: Diagnosis not present

## 2024-05-26 DIAGNOSIS — R9431 Abnormal electrocardiogram [ECG] [EKG]: Secondary | ICD-10-CM | POA: Diagnosis not present

## 2024-05-26 DIAGNOSIS — R519 Headache, unspecified: Secondary | ICD-10-CM | POA: Diagnosis not present

## 2024-05-26 DIAGNOSIS — Z87891 Personal history of nicotine dependence: Secondary | ICD-10-CM | POA: Diagnosis not present

## 2024-05-26 DIAGNOSIS — R42 Dizziness and giddiness: Secondary | ICD-10-CM | POA: Diagnosis not present

## 2024-05-26 DIAGNOSIS — R000 Tachycardia, unspecified: Secondary | ICD-10-CM | POA: Diagnosis not present

## 2024-05-26 DIAGNOSIS — G35D Multiple sclerosis, unspecified: Secondary | ICD-10-CM | POA: Diagnosis not present

## 2024-05-26 DIAGNOSIS — I4729 Other ventricular tachycardia: Secondary | ICD-10-CM | POA: Diagnosis not present

## 2024-05-26 DIAGNOSIS — Z9889 Other specified postprocedural states: Secondary | ICD-10-CM | POA: Diagnosis not present

## 2024-05-26 DIAGNOSIS — I10 Essential (primary) hypertension: Secondary | ICD-10-CM | POA: Diagnosis not present

## 2024-05-26 DIAGNOSIS — I160 Hypertensive urgency: Secondary | ICD-10-CM | POA: Diagnosis not present

## 2024-05-26 DIAGNOSIS — T465X6A Underdosing of other antihypertensive drugs, initial encounter: Secondary | ICD-10-CM | POA: Diagnosis not present

## 2024-05-26 DIAGNOSIS — R079 Chest pain, unspecified: Secondary | ICD-10-CM | POA: Diagnosis not present

## 2024-05-26 DIAGNOSIS — M25562 Pain in left knee: Secondary | ICD-10-CM | POA: Diagnosis not present

## 2024-05-26 DIAGNOSIS — R2681 Unsteadiness on feet: Secondary | ICD-10-CM | POA: Diagnosis not present

## 2024-05-26 DIAGNOSIS — R002 Palpitations: Secondary | ICD-10-CM | POA: Diagnosis not present

## 2024-05-26 DIAGNOSIS — S0990XA Unspecified injury of head, initial encounter: Secondary | ICD-10-CM | POA: Diagnosis not present

## 2024-05-26 DIAGNOSIS — I517 Cardiomegaly: Secondary | ICD-10-CM | POA: Diagnosis not present

## 2024-05-26 DIAGNOSIS — M542 Cervicalgia: Secondary | ICD-10-CM | POA: Diagnosis not present

## 2024-05-26 DIAGNOSIS — E876 Hypokalemia: Secondary | ICD-10-CM | POA: Diagnosis not present

## 2024-05-26 DIAGNOSIS — E119 Type 2 diabetes mellitus without complications: Secondary | ICD-10-CM | POA: Diagnosis not present

## 2024-05-26 DIAGNOSIS — J45909 Unspecified asthma, uncomplicated: Secondary | ICD-10-CM | POA: Diagnosis not present

## 2024-05-27 ENCOUNTER — Ambulatory Visit: Admitting: Family Medicine

## 2024-05-31 NOTE — Care Plan (Signed)
  Problem: Discharge Planning Goal: Knowledge of medical problems (What is my main problem?) 05/31/2024 1459 by Fatima CHRISTELLA Mattock Outcome: Adequate for Discharge 05/31/2024 1459 by Fatima CHRISTELLA Mattock Outcome: Progressing Goal: Knowledge of self care (What do I need to do when I go home?) 05/31/2024 1459 by Fatima CHRISTELLA Mattock Outcome: Adequate for Discharge 05/31/2024 1459 by Fatima CHRISTELLA Mattock Outcome: Progressing Goal: Knowledge of treatment plan (Why is it important for me to do this?) 05/31/2024 1459 by Fatima CHRISTELLA Mattock Outcome: Adequate for Discharge 05/31/2024 1459 by Fatima CHRISTELLA Opelinski Outcome: Progressing Goal: Knowledge of medication management 05/31/2024 1459 by Fatima CHRISTELLA Mattock Outcome: Adequate for Discharge 05/31/2024 1459 by Fatima CHRISTELLA Opelinski Outcome: Progressing   Problem: Injury Risk, Abnormal Glucose Level Goal: Glucose level within specified parameters 05/31/2024 1459 by Fatima CHRISTELLA Opelinski Outcome: Adequate for Discharge 05/31/2024 1459 by Fatima CHRISTELLA Opelinski Outcome: Progressing   Problem: Sensory Perception - Impaired Goal: Absence of physical injury 05/31/2024 1459 by Fatima CHRISTELLA Mattock Outcome: Adequate for Discharge 05/31/2024 1459 by Fatima CHRISTELLA Opelinski Outcome: Progressing   Problem: Deep Venous Thrombosis, Risk of Goal: Absence of deep venous thrombosis 05/31/2024 1459 by Fatima CHRISTELLA Mattock Outcome: Adequate for Discharge 05/31/2024 1459 by Fatima CHRISTELLA Opelinski Outcome: Progressing   Problem: Bleeding, Risk of Goal: Absence of active bleeding 05/31/2024 1459 by Fatima CHRISTELLA Mattock Outcome: Adequate for Discharge 05/31/2024 1459 by Fatima CHRISTELLA Opelinski Outcome: Progressing Goal: Absence of impaired coagulation signs and symptoms 05/31/2024 1459 by Fatima CHRISTELLA Mattock Outcome: Adequate for Discharge 05/31/2024 1459 by Fatima CHRISTELLA Opelinski Outcome: Progressing   Problem: Fall Prevention Goal: Absence of falls 05/31/2024 1459 by Fatima CHRISTELLA Mattock Outcome: Adequate for Discharge 05/31/2024 1459 by Fatima CHRISTELLA Opelinski Outcome: Progressing   Problem: Pressure Injury Goal: Pressure injury healing 05/31/2024 1459 by Fatima CHRISTELLA Opelinski Outcome: Adequate for Discharge 05/31/2024 1459 by Fatima CHRISTELLA Opelinski Outcome: Progressing Goal: Absence of new pressure injury 05/31/2024 1459 by Fatima CHRISTELLA Mattock Outcome: Adequate for Discharge 05/31/2024 1459 by Fatima CHRISTELLA Mattock Outcome: Progressing   Problem: Tissue Perfusion, Cardiopulmonary - Altered Goal: Blood pressure within specified parameters 05/31/2024 1459 by Fatima CHRISTELLA Mattock Outcome: Adequate for Discharge 05/31/2024 1459 by Fatima CHRISTELLA Mattock Outcome: Progressing

## 2024-05-31 NOTE — Progress Notes (Signed)
 [facility] Case Management Discharge Note   Patient:   Jennifer [name] MR Number:  [id number] Patient Date of Birth: Dec 22, 1965 Age/Sex:  58 y.o./female   Discharge Plan   Case Management interviewed: Patient Contact Method: In-person Disposition: Home                                                   Transportation   Does the patient need discharge transport arranged?: No   Who is arranging transport?: patient may need [facility] for ride home when discharged if husband can't transport home Mode of transportation?: other (comment) (possible [facility])    Accepted Agency   Selected Continued Care - Admitted Since 05/26/2024   No services have been selected for the patient.      Pt will d/c today with no needs. IMM completed and placed in chart.Case Management has assessed this patient/family or caregiver's readiness, willingness and ability to provide or support self-management activities when needed after discharge from the acute care setting. Level of care algorithm used for appropriate discharge planning   Electronically signed: Lesley Meeter, MSW 05/31/2024 2:34 PM

## 2024-06-02 ENCOUNTER — Telehealth: Payer: Self-pay | Admitting: *Deleted

## 2024-06-02 DIAGNOSIS — Z87891 Personal history of nicotine dependence: Secondary | ICD-10-CM | POA: Diagnosis not present

## 2024-06-02 DIAGNOSIS — I10 Essential (primary) hypertension: Secondary | ICD-10-CM | POA: Diagnosis not present

## 2024-06-02 DIAGNOSIS — R079 Chest pain, unspecified: Secondary | ICD-10-CM | POA: Diagnosis not present

## 2024-06-02 DIAGNOSIS — Z20822 Contact with and (suspected) exposure to covid-19: Secondary | ICD-10-CM | POA: Diagnosis not present

## 2024-06-02 DIAGNOSIS — R0602 Shortness of breath: Secondary | ICD-10-CM | POA: Diagnosis not present

## 2024-06-02 DIAGNOSIS — R0789 Other chest pain: Secondary | ICD-10-CM | POA: Diagnosis not present

## 2024-06-02 NOTE — Progress Notes (Signed)
 Case Management Update  Date: 06/02/2024   Time: 8:32 AM   Patient Type: ED  SW provided cab voucher for pt to return home.     Case Management Coordination Status: Coordination Complete    Anticipated Discharge Location: Home  If Plan A discharging location is not feasible: Potential Plan B: To be determined  Lamar Dasie Frame, MSW, LCSW

## 2024-06-02 NOTE — Telephone Encounter (Addendum)
 Regarding: medication directions ----- Message from [facility] C sent at 06/02/2024  9:33 AM EDT ----- ED F/U  Caller/Relationship:self Reason for call: questions about directions on taking medication  Additional comment:  PACT After Hours Triage Call  Reason for Conversation: questions about directions on taking medication   Current Symptoms:  ED today 10/20 - Discharged 0811  First call back @ 0946, left message on verified voice mail that nurse will call back in a few minutes. Second call back @ [phone], patient reached. Patient states she is trying to find out of she is supposed to keep taking Metoprolol  with the new prescription for Clonidine . Reports she was advised to stop taking Hydralazine  since she allergic. Reports her symptoms are the same.    Advised per  ED After Visit Summary  Printed 06/02/2024 CONTINUE taking these medications  metoprolol  tartrate 50 mg tabletCommonly known as: LOPRESSORTake 1 tablet (50 mg total) by mouth 2 (two) times a day.   EWA, RN  Home Care Tried & If Effective: N/A  Denies: N/A  Disposition: Home Care  Reason for Disposition: .  [1] Recent medical visit within 24 hours AND [2] condition / symptoms SAME (unchanged) AND [3] caller has additional questions triager can answer  Protocols Used: Recent Medical Visit for Illness Follow-up Call-Adult-AH

## 2024-06-02 NOTE — Telephone Encounter (Signed)
 Please call pt and inform her.

## 2024-06-02 NOTE — ED Notes (Signed)
 Pt was given flyer with info on bus stops. Pt was offered phone to use, but she said she has a phone. Pt was unhappy with discharge but denied having any questions. When RN took pt to lobby, pt said, Just leave me alone. I'll call these people (referring to either bus or [place]).   Chiquita Gerard Molt, RN 06/02/24 [phone]

## 2024-06-03 ENCOUNTER — Telehealth (INDEPENDENT_AMBULATORY_CARE_PROVIDER_SITE_OTHER): Admitting: Family Medicine

## 2024-06-03 VITALS — BP 132/58

## 2024-06-03 DIAGNOSIS — E876 Hypokalemia: Secondary | ICD-10-CM | POA: Diagnosis not present

## 2024-06-03 DIAGNOSIS — R000 Tachycardia, unspecified: Secondary | ICD-10-CM

## 2024-06-03 DIAGNOSIS — R9431 Abnormal electrocardiogram [ECG] [EKG]: Secondary | ICD-10-CM | POA: Diagnosis not present

## 2024-06-03 DIAGNOSIS — I10 Essential (primary) hypertension: Secondary | ICD-10-CM

## 2024-06-03 DIAGNOSIS — R748 Abnormal levels of other serum enzymes: Secondary | ICD-10-CM | POA: Diagnosis not present

## 2024-06-03 DIAGNOSIS — I160 Hypertensive urgency: Secondary | ICD-10-CM

## 2024-06-03 NOTE — Assessment & Plan Note (Signed)
 Blood pressure was better today but she was lying down when taking the blood pressure we did review the parameters that were on the bottle for the amlodipine  I did encourage her to keep track of how often she is actually taking it if she is taking it most of the time then this may be a great medication for us  to add into her regimen versus if she is just using it a couple of times a week.  She still feels weak and dizzy and off today.  Encouraged her to hydrate since it does sound like they ended up giving her some IV fluids while she was at the hospital.

## 2024-06-03 NOTE — Progress Notes (Signed)
 Virtual Visit via Video Note  I connected with Jennifer [name] on 06/03/24 at  2:00 PM EDT by a video enabled telemedicine application and verified that I am speaking with the correct person using two identifiers.   I discussed the limitations of evaluation and management by telemedicine and the availability of in person appointments. The patient expressed understanding and agreed to proceed.  Patient location: at home Provider location: in office  Subjective:    CC:   Chief Complaint  Patient presents with   Medical Management of Chronic Issues    HPI: Jennifer [name] to hospital on 10/13 for tachycardia  and hypertensive urgency. She was admitted for 5 days.  They did repeat renin and aldosterone levels which were not consistent with hyperaldosteronism.  She was also worked up for pheochromocytoma given her headaches and sweating and that was normal as well.  She was rehydrated with IV fluids.  D-dimer was negative.  She also had a repeat echocardiogram which was negative and unremarkable.  Cardiac enzymes were negative.  Zio patch was in place.  Flecainide  was discontinued and metoprolol  was increased to 50 mg twice a day.  At d/c told to continue hydralazine  and metoprolol . Noticed the hydralazine  was causing rash and puffy eyes. Went back to the ED yesterday. They stopped the the hydralazine  and changed her to clonidine .    She is just not felt great and has been tired today she has mostly been laying down and resting.  She did check her blood pressure earlier today lying down.  She did have some questions about the parameters around when to take the clonidine  and when not to take clonidine  she is still taking her metoprolol .   Past medical history, Surgical history, Family history not pertinant except as noted below, Social history, Allergies, and medications have been entered into the medical record, reviewed, and corrections made.    Objective:    General: Speaking clearly in  complete sentences without any shortness of breath.  Alert and oriented x3.  Normal judgment. No apparent acute distress.    Impression and Recommendations:    Problem List Items Addressed This Visit       Cardiovascular and Mediastinum   Low-renin essential hypertension   Blood pressure was better today but she was lying down when taking the blood pressure we did review the parameters that were on the bottle for the amlodipine  I did encourage her to keep track of how often she is actually taking it if she is taking it most of the time then this may be a great medication for us  to add into her regimen versus if she is just using it a couple of times a week.  She still feels weak and dizzy and off today.  Encouraged her to hydrate since it does sound like they ended up giving her some IV fluids while she was at the hospital.        Other   Hypokalemia   Due to recheck your potassium      Relevant Orders   CMP14+EGFR   Other Visit Diagnoses       Elevated liver enzymes    -  Primary   Relevant Orders   CMP14+EGFR     Hypertensive urgency         Tachycardia           Hypokalemia-she has been taking her potassium but is going to switch to taking it every other day until she can recheck  her potassium next week on Monday.  Tachycardia-still having some episodes of palpitations.  Liver enzymes were mildly elevated - during hospital admission so we are going to recheck that next week as well.    Orders Placed This Encounter  Procedures   CMP14+EGFR    Scheduling Instructions:     [place] lab.    No orders of the defined types were placed in this encounter.    I discussed the assessment and treatment plan with the patient. The patient was provided an opportunity to ask questions and all were answered. The patient agreed with the plan and demonstrated an understanding of the instructions.   The patient was advised to call back or seek an in-person evaluation if the  symptoms worsen or if the condition fails to improve as anticipated.  I spent 30 minutes on the day of the encounter to include pre-visit record review, face-to-face time with the patient and post visit ordering of test.  Dorothyann Byars, MD

## 2024-06-03 NOTE — Assessment & Plan Note (Signed)
 Due to recheck your potassium

## 2024-06-03 NOTE — Progress Notes (Signed)
 Called pt no answer. Lvm advising her that I was calling to do her prescreening.

## 2024-06-03 NOTE — Telephone Encounter (Signed)
 Contacted patient to inform her that appointment scheduled 06/03/24 at 2 pm with Dr. Alvan would need to be in person vs virtual due to medicare guideline changes. Patient began yelling at me over the phone and became belligerent. Patient was asked not to yell at me and that I was only relaying the message so she would be aware.  Patient stated  You are lying to me and I have already called my insurance company. As I tried to explain to Ms. Shira again that the law was not renewed to allow patients to have virtual visits. Ms. Mansouri then called me a  Bitch. I choose to disconnect the phone call due to the verbal abuse that occurred. Information regarding this encounter has been given to patients PCP, nurse and Practice Admin.

## 2024-06-04 DIAGNOSIS — M25562 Pain in left knee: Secondary | ICD-10-CM | POA: Diagnosis not present

## 2024-06-04 DIAGNOSIS — M25552 Pain in left hip: Secondary | ICD-10-CM | POA: Diagnosis not present

## 2024-06-04 DIAGNOSIS — G8929 Other chronic pain: Secondary | ICD-10-CM | POA: Diagnosis not present

## 2024-06-05 DIAGNOSIS — Z8616 Personal history of COVID-19: Secondary | ICD-10-CM | POA: Diagnosis not present

## 2024-06-05 DIAGNOSIS — R519 Headache, unspecified: Secondary | ICD-10-CM | POA: Diagnosis not present

## 2024-06-05 DIAGNOSIS — Z8701 Personal history of pneumonia (recurrent): Secondary | ICD-10-CM | POA: Diagnosis not present

## 2024-06-05 DIAGNOSIS — Z8544 Personal history of malignant neoplasm of other female genital organs: Secondary | ICD-10-CM | POA: Diagnosis not present

## 2024-06-05 DIAGNOSIS — I159 Secondary hypertension, unspecified: Secondary | ICD-10-CM | POA: Diagnosis not present

## 2024-06-05 DIAGNOSIS — Z87891 Personal history of nicotine dependence: Secondary | ICD-10-CM | POA: Diagnosis not present

## 2024-06-05 DIAGNOSIS — Z8542 Personal history of malignant neoplasm of other parts of uterus: Secondary | ICD-10-CM | POA: Diagnosis not present

## 2024-06-05 DIAGNOSIS — Z90710 Acquired absence of both cervix and uterus: Secondary | ICD-10-CM | POA: Diagnosis not present

## 2024-06-05 DIAGNOSIS — E119 Type 2 diabetes mellitus without complications: Secondary | ICD-10-CM | POA: Diagnosis not present

## 2024-06-05 DIAGNOSIS — F419 Anxiety disorder, unspecified: Secondary | ICD-10-CM | POA: Diagnosis not present

## 2024-06-05 DIAGNOSIS — R0789 Other chest pain: Secondary | ICD-10-CM | POA: Diagnosis not present

## 2024-06-05 DIAGNOSIS — Z9049 Acquired absence of other specified parts of digestive tract: Secondary | ICD-10-CM | POA: Diagnosis not present

## 2024-06-05 DIAGNOSIS — Z8719 Personal history of other diseases of the digestive system: Secondary | ICD-10-CM | POA: Diagnosis not present

## 2024-06-05 DIAGNOSIS — I10 Essential (primary) hypertension: Secondary | ICD-10-CM | POA: Diagnosis not present

## 2024-06-05 DIAGNOSIS — D320 Benign neoplasm of cerebral meninges: Secondary | ICD-10-CM | POA: Diagnosis not present

## 2024-06-06 DIAGNOSIS — R7989 Other specified abnormal findings of blood chemistry: Secondary | ICD-10-CM | POA: Diagnosis not present

## 2024-06-06 DIAGNOSIS — I472 Ventricular tachycardia, unspecified: Secondary | ICD-10-CM | POA: Diagnosis not present

## 2024-06-06 DIAGNOSIS — I10 Essential (primary) hypertension: Secondary | ICD-10-CM | POA: Diagnosis not present

## 2024-06-09 DIAGNOSIS — I499 Cardiac arrhythmia, unspecified: Secondary | ICD-10-CM | POA: Diagnosis not present

## 2024-06-10 ENCOUNTER — Other Ambulatory Visit: Payer: Self-pay | Admitting: Medical Genetics

## 2024-06-10 DIAGNOSIS — Z136 Encounter for screening for cardiovascular disorders: Secondary | ICD-10-CM | POA: Diagnosis not present

## 2024-06-12 NOTE — Telephone Encounter (Signed)
 Spoke to patient and let her know that we will not tolerate bullying and profanity, if there is another episode she will have to be discharged. Sharry apologized and explained that she was just released from the hospital a few days prior and would have loved a sooner notice other than being notified 30 min prior to closing that her appointment was change to virtual for the following day. Transportation is a major conflict, I explained the policy and patient is willing to pay if telehealth is not covered for the October appointment. Going forward if any further visits will not be covered she will find another provider that can see her virtually. She called her insurance and they told her that telehealth visits are covered and that we did not know what we were talking about. I have explained this plan to her provider as well that if she calls harassing and cursing at the staff again she will need to find another practice either way.

## 2024-06-13 DIAGNOSIS — Z881 Allergy status to other antibiotic agents status: Secondary | ICD-10-CM | POA: Diagnosis not present

## 2024-06-13 DIAGNOSIS — R079 Chest pain, unspecified: Secondary | ICD-10-CM | POA: Diagnosis not present

## 2024-06-13 DIAGNOSIS — Z91011 Allergy to milk products, unspecified: Secondary | ICD-10-CM | POA: Diagnosis not present

## 2024-06-13 DIAGNOSIS — I10 Essential (primary) hypertension: Secondary | ICD-10-CM | POA: Diagnosis not present

## 2024-06-13 DIAGNOSIS — J45909 Unspecified asthma, uncomplicated: Secondary | ICD-10-CM | POA: Diagnosis not present

## 2024-06-13 DIAGNOSIS — R0789 Other chest pain: Secondary | ICD-10-CM | POA: Diagnosis not present

## 2024-06-13 DIAGNOSIS — F32A Depression, unspecified: Secondary | ICD-10-CM | POA: Diagnosis not present

## 2024-06-13 DIAGNOSIS — Z886 Allergy status to analgesic agent status: Secondary | ICD-10-CM | POA: Diagnosis not present

## 2024-06-13 DIAGNOSIS — Z91010 Allergy to peanuts: Secondary | ICD-10-CM | POA: Diagnosis not present

## 2024-06-13 DIAGNOSIS — Z885 Allergy status to narcotic agent status: Secondary | ICD-10-CM | POA: Diagnosis not present

## 2024-06-13 DIAGNOSIS — I161 Hypertensive emergency: Secondary | ICD-10-CM | POA: Diagnosis not present

## 2024-06-13 DIAGNOSIS — R29818 Other symptoms and signs involving the nervous system: Secondary | ICD-10-CM | POA: Diagnosis not present

## 2024-06-13 DIAGNOSIS — Z91018 Allergy to other foods: Secondary | ICD-10-CM | POA: Diagnosis not present

## 2024-06-13 DIAGNOSIS — Z87891 Personal history of nicotine dependence: Secondary | ICD-10-CM | POA: Diagnosis not present

## 2024-06-13 DIAGNOSIS — Z882 Allergy status to sulfonamides status: Secondary | ICD-10-CM | POA: Diagnosis not present

## 2024-06-13 DIAGNOSIS — E1165 Type 2 diabetes mellitus with hyperglycemia: Secondary | ICD-10-CM | POA: Diagnosis not present

## 2024-06-13 DIAGNOSIS — Z880 Allergy status to penicillin: Secondary | ICD-10-CM | POA: Diagnosis not present

## 2024-06-13 DIAGNOSIS — I160 Hypertensive urgency: Secondary | ICD-10-CM | POA: Diagnosis not present

## 2024-06-13 DIAGNOSIS — Z91041 Radiographic dye allergy status: Secondary | ICD-10-CM | POA: Diagnosis not present

## 2024-06-13 DIAGNOSIS — Z91048 Other nonmedicinal substance allergy status: Secondary | ICD-10-CM | POA: Diagnosis not present

## 2024-06-13 DIAGNOSIS — F419 Anxiety disorder, unspecified: Secondary | ICD-10-CM | POA: Diagnosis not present

## 2024-06-13 DIAGNOSIS — R519 Headache, unspecified: Secondary | ICD-10-CM | POA: Diagnosis not present

## 2024-06-13 DIAGNOSIS — R4182 Altered mental status, unspecified: Secondary | ICD-10-CM | POA: Diagnosis not present

## 2024-06-13 DIAGNOSIS — Z888 Allergy status to other drugs, medicaments and biological substances status: Secondary | ICD-10-CM | POA: Diagnosis not present

## 2024-06-13 DIAGNOSIS — R202 Paresthesia of skin: Secondary | ICD-10-CM | POA: Diagnosis not present

## 2024-06-13 DIAGNOSIS — I6523 Occlusion and stenosis of bilateral carotid arteries: Secondary | ICD-10-CM | POA: Diagnosis not present

## 2024-06-13 LAB — HEMOGLOBIN A1C: Hemoglobin A1C: 6.3

## 2024-06-14 DIAGNOSIS — I161 Hypertensive emergency: Secondary | ICD-10-CM | POA: Diagnosis not present

## 2024-06-14 NOTE — Discharge Summary (Signed)
 Discharge Summary   PATIENT NAME:  Jennifer [name] DOB:  [birth year] MRN:  [id number]   Admission Date:   06/13/2024 10:39 AM                      Attending: Calvert Newport, MD   Discharge Date:   06/14/2024              Discharging: Calvert Newport, MD   Code status: Full Code      Principal Diagnosis:  Atypical chest pain [R07.89] Frontal headache [R51.9] Arm paresthesia, left [R20.2] Hypertensive urgency [I16.0] Hypertensive emergency [I16.1] Sensitivity to medication, initial encounter [T78.40XA]  Active & Resolved Diagnosis: Principal Problem (Resolved):   Hypertensive emergency Active Problems:   Sensitivity to medication Resolved Problems:   Stroke-like symptoms   Sensitivity to medication, initial encounter  Hospital Course and Treatment: Jennifer [name] is a 58 y.o. female with PMHx of HTN, T2DM, asthma, anxiety, depression, MS, patient reported hypersensitivity to medications coming in with concerns of not feeling well. Patient states the beginning in September she had a fall and she states that she has just not felt the same since.  With this she explains that anytime she tries to walk she cannot get around without sweating profusely or needing the assistance of a walker.  Initial workup in the ED was unremarkable including labs, chest x-ray and CT scan of the head.  EKG showed sinus rhythm no significant ST acute changes.   Patient was admitted to hospital and started on clonidine , blood pressure improved which was noted initially to be elevated.  MRI of the brain did not show any acute abnormalities.  She had nonspecific white matter disease changes, she does have a history of MS according to patient.  Carotid ultrasound were unremarkable.  Patient is back at her baseline.  No focal or new neurological symptoms.  Patient baseline walk with a walker.  She does have concern regarding her blood pressure.  She does have a history of sleep apnea but she could not get the machine as  an outpatient, she follows cardiologist at [facility] but she does not have a primary care physician locally here but she just moved to the area.  I strongly advised her to follow-up with primary care, will make referral for her.  Advised her to monitor blood pressure and measure her MAP blood pressure at home specially that her diastolic blood pressure is low.  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  It is noted that she have extensive allergy  to medication and substance.  Patient discharged home with home PT in stable medical condition.     OBJECTIVE: BP 159/85 (BP Location: Left arm, Patient Position: Lying)   Pulse 82   Temp 97.8 F (36.6 C) (Oral)   Resp 18   Ht 1.575 m (5' 2)   Wt 97.9 kg (215 lb 14.4 oz)   SpO2 98%   BMI 39.49 kg/m  At the time of discharge, the patient was seen and examined by the attending physiican and was deemed appropriate for discharge.   General Appearance: Alert, cooperative, no distress, appears stated age, obese  Lungs:  Clear to auscultation bilaterally, respirations unlabored  Heart: Regular rate and rhythm, S1, S2 normal, no murmur, rub or gallop  Abdomen:  Soft, non-tender, bowel sounds active, obese  Extremities: Extremities normal, atraumatic, no cyanosis or edema  Neurologic: Non Focal, CN II-12 grossly unremarkable  Laboratory Data: Recent Results (from the past 48 hours)  Comprehensive Metabolic Panel   Collection Time: 06/13/24 11:21 AM  Result Value Ref Range   Sodium 137 136 - 145 mmol/L   Potassium 3.4 3.4 - 4.5 mmol/L   Chloride 103 98 - 107 mmol/L   CO2 26 21 - 31 mmol/L   Anion Gap 8 6 - 14 mmol/L   Glucose, Random 188 (H) 70 - 99 mg/dL   Blood Urea  Nitrogen (BUN) 10 7 - 25 mg/dL   Creatinine 9.24 9.39 - 1.20 mg/dL   eGFR >09 >40 fO/fpw/8.26f7   Albumin  3.4 (L) 3.5 - 5.7 g/dL   Total Protein 6.4 6.4 - 8.9 g/dL   Bilirubin, Total 0.3 0.3 - 1.0 mg/dL   Alkaline Phosphatase (ALP) 66 34 -  104 U/L   Aspartate Aminotransferase (AST) 20 13 - 39 U/L   Alanine Aminotransferase (ALT) 21 7 - 52 U/L   Calcium  8.5 (L) 8.6 - 10.3 mg/dL   BUN/Creatinine Ratio     Corrected Calcium  9.0 mg/dL  Troponin, High Sensitive   Collection Time: 06/13/24 11:21 AM  Result Value Ref Range   Troponin, High Sensitive 5 <12 ng/L  CBC with Differential   Collection Time: 06/13/24 11:21 AM  Result Value Ref Range   WBC 4.30 (L) 4.40 - 11.00 10*3/uL   RBC 4.46 4.10 - 5.10 10*6/uL   Hemoglobin 13.3 12.3 - 15.3 g/dL   Hematocrit 61.0 64.0 - 44.6 %   Mean Corpuscular Volume (MCV) 87.2 80.0 - 96.0 fL   Mean Corpuscular Hemoglobin (MCH) 29.9 27.5 - 33.2 pg   Mean Corpuscular Hemoglobin Conc (MCHC) 34.2 33.0 - 37.0 g/dL   Red Cell Distribution Width (RDW) 15.0 12.3 - 17.0 %   Platelet Count (PLT) 159 150 - 450 10*3/uL   Mean Platelet Volume (MPV) 9.0 fL   Neutrophils % 64 %   Lymphocytes % 24 %   Monocytes % 9 %   Eosinophils % 3 %   Basophils % 0 %   Neutrophils Absolute 2.80 1.80 - 7.80 10*3/uL   Lymphocytes # 1.00 1.00 - 4.80 10*3/uL   Monocytes # 0.40 0.00 - 0.80 10*3/uL   Eosinophils # 0.10 0.00 - 0.50 10*3/uL   Basophils # 0.00 0.00 - 0.20 10*3/uL  Magnesium    Collection Time: 06/13/24 11:21 AM  Result Value Ref Range   Magnesium  2.0 1.9 - 2.7 mg/dL  Lipid Panel   Collection Time: 06/13/24 11:21 AM  Result Value Ref Range   Cholesterol, Total, Lipid Panel 184 <200 mg/dL   Triglycerides, Lipid Panel 153 (H) <150 mg/dL   HDL Cholesterol - Lipid Panel 43 (L) >50 mg/dL   LDL Cholesterol, Calculated 114 (H) <100 mg/dL   Non-HDL Cholesterol 858 (H) <130 mg/dL  Hemoglobin J8R With Estimated Average Glucose   Collection Time: 06/13/24 11:21 AM  Result Value Ref Range   Hemoglobin A1c 6.3 (H) <5.7 %   Estimated Average Glucose 134 70 - 154 mg/dL  POC Glucose   Collection Time: 06/13/24  4:34 PM  Result Value Ref Range   Glucose, POC 84 70 - 99 mg/dL  POC Glucose   Collection Time:  06/13/24  7:29 PM  Result Value Ref Range   Glucose, POC 130 (H) 70 - 99 mg/dL  Basic Metabolic Panel   Collection Time: 06/14/24  5:45 AM  Result Value Ref Range   Sodium 141 136 - 145 mmol/L   Potassium 3.5 3.4 - 4.5 mmol/L   Chloride  107 98 - 107 mmol/L   CO2 26 21 - 31 mmol/L   Anion Gap 8 6 - 14 mmol/L   Glucose, Random 101 (H) 70 - 99 mg/dL   Blood Urea  Nitrogen (BUN) 13 7 - 25 mg/dL   Creatinine 9.29 9.39 - 1.20 mg/dL   eGFR >09 >40 fO/fpw/8.26f7   Calcium  8.7 8.6 - 10.3 mg/dL   BUN/Creatinine Ratio    CBC with Differential   Collection Time: 06/14/24  5:45 AM  Result Value Ref Range   WBC 4.20 (L) 4.40 - 11.00 10*3/uL   RBC 4.52 4.10 - 5.10 10*6/uL   Hemoglobin 13.5 12.3 - 15.3 g/dL   Hematocrit 60.4 64.0 - 44.6 %   Mean Corpuscular Volume (MCV) 87.3 80.0 - 96.0 fL   Mean Corpuscular Hemoglobin (MCH) 29.8 27.5 - 33.2 pg   Mean Corpuscular Hemoglobin Conc (MCHC) 34.1 33.0 - 37.0 g/dL   Red Cell Distribution Width (RDW) 14.9 12.3 - 17.0 %   Platelet Count (PLT) 146 (L) 150 - 450 10*3/uL   Mean Platelet Volume (MPV) 9.0 fL   Neutrophils % 57 %   Lymphocytes % 30 %   Monocytes % 10 %   Eosinophils % 3 %   Basophils % 1 %   Neutrophils Absolute 2.40 1.80 - 7.80 10*3/uL   Lymphocytes # 1.30 1.00 - 4.80 10*3/uL   Monocytes # 0.40 0.00 - 0.80 10*3/uL   Eosinophils # 0.10 0.00 - 0.50 10*3/uL   Basophils # 0.00 0.00 - 0.20 10*3/uL  POC Glucose   Collection Time: 06/14/24  8:06 AM  Result Value Ref Range   Glucose, POC 122 (H) 70 - 99 mg/dL    Imaging: US  Carotid Doppler Bilateral Narrative: US  CAROTID DOPPLER BILATERAL, 06/13/2024 6:40 PM   INDICATION: stroke follow up  COMPARISON: None.  TECHNIQUE: Multi-planar real-time ultrasonography of the carotid and vertebral artery systems was performed using grayscale imaging along with Doppler color flow and spectral analysis.   FINDINGS:  RIGHT CAROTID system: Grayscale: Intimal thickening Peak systolic  velocities: SABRA  Max ICA = 59 cm/sec .  Distal CCA = 81 cm/sec .  ICA/CCA ratio is 0.7  RIGHT VERTEBRAL artery: Flow: Antegrade.  LEFT CAROTID system: Grayscale: Intimal thickening Peak systolic velocities: SABRA  Max ICA = 61 cm/sec .  Distal CCA =  73 cm/sec  .  ICA/CCA ratio is 0.4  LEFT VERTEBRAL artery: Flow: Antegrade. Impression: No evidence of hemodynamically significant stenosis in either carotid system. Based upon consensus criteria, there is less than 50% stenosis in the proximal ICA, bilaterally. Antegrade flow within both vertebral arteries. MRI Brain WO Contrast Narrative: MRI BRAIN WITHOUT CONTRAST, 06/13/2024 6:19 PM  INDICATION: Neuro deficit, acute, stroke suspected   COMPARISON: Head CT 06/13/2024  TECHNIQUE: Multiplanar, multi-sequence MR imaging of the entire brain was performed without contrast.  FINDINGS:  Calvarium/skull base: No focal marrow replacing lesion suggestive of neoplasm. Orbits: Grossly unremarkable. Paranasal sinuses: Imaged portions clear. Brain: No evidence of acute abnormality. 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 No evidence of acute ischemia. No mass effect, hemorrhage, or hydrocephalus. Grossly normal flow-related signal in the major intracranial arteries and dural sinuses. Impression: 1. No acute abnormality is evident.  2. Nonspecific white matter disease, typically attributed to prior trauma/inflammation/demyelination, or chronic ischemia associated with migraine/atherosclerosis. . . ECG 12 lead Ventricular Rate                   85  BPM                  Atrial Rate                        85        BPM                  P-R Interval                       124       ms                   QRS Duration                       84        ms                   Q-T Interval                       374       ms                   QTC Calculation Bazett             445        ms                   Calculated P Axis                  43        degrees              Calculated R Axis                  29        degrees              Calculated T Axis                  53        degrees               Sinus rhythm Nonspecific ST and T wave abnormality  When compared with ECG of 06-Jun-2024 09 28, No significant change was found CT Head WO Contrast Narrative: CT HEAD WITHOUT CONTRAST, 06/13/2024 11:56 AM  INDICATION: Mental status change, unknown cause, Stroke, follow up, headache, left sided paresthesias in face and arm since yesterday \  \   COMPARISON: Head CT 06/05/2024  TECHNIQUE: Axial CT images of the brain from skull base to vertex, including portions of the face and sinuses, were obtained without contrast. Supplemental 2D reformatted images were generated and reviewed as needed.  All CT scans at [facility] are performed using radiation dose optimization techniques as appropriate to a performed exam, including but not limited to one or more of the following: automatic exposure control, adjustment of the mA and/or kV according to patient size, use of iterative reconstruction technique. In addition, our institution participates in a radiation dose monitoring program to optimize patient radiation exposure.  FINDINGS: Calvarium/skull base: No evidence of acute fracture or destructive lesion. Mastoids and middle ears demonstrate no substantial mucosal disease.  Paranasal sinuses: No air fluid levels.  Brain: No acute large vascular territory infarct. No mass effect. No hydrocephalus.  No acute hemorrhage. Similar small calcified structure along the left falx (series 5 image 32) which could reflect a meningioma. Impression: No acute intracranial abnormality. However, CT is relatively insensitive for the detection of acute infarct within the first 24-48 hours, and MRI may be indicated if there is high clinical  suspicion. XR Chest 2 Views Narrative: XR CHEST 2 VIEWS, 06/13/2024 11:25 AM  INDICATION: Chest Pain  COMPARISON: Chest radiograph 06/05/2024  FINDINGS:   Cardiovascular: Cardiac silhouette and pulmonary vasculature are within normal limits. Mediastinum: Within normal limits. Lungs/pleura: Clear. No pleural effusion or pneumothorax Upper abdomen: Right upper quadrant clips.  Chest wall/osseous structures: Multilevel degenerative changes of the thoracic spine. No acute osseous abnormality. Impression: There is no evidence of acute cardiac or pulmonary abnormality.   No results found for this visit on 06/13/24 (from the past week).    Discharge Medications:   Discharge Medications     Medications To Continue      Sig Disp Refill Start End  acetaminophen  500 mg tablet Commonly known as: TYLENOL   Take 500 mg by mouth every 6 (six) hours as needed for mild pain (1-3).   0     cloNIDine  0.1 mg tablet Commonly known as: CATAPRES   Take 1 tablet (0.1 mg total) by mouth 2 (two) times a day as needed for high blood pressure.  60 tablet  0     EPINEPHrine  0.3 mg/0.3 mL injection syringe Commonly known as: EPIPEN   Inject 1 Syringe into the thigh as needed for anaphylaxis.   0     famotidine  20 mg tablet Commonly known as: PEPCID   Take 20 mg by mouth 2 (two) times a day as needed for heartburn.   0     fluticasone  propionate 50 mcg/spray nasal spray Commonly known as: FLONASE   Administer 1 spray into each nostril daily.  9.9 mL  0     levalbuterol  45 mcg/actuation inhaler Commonly known as: XOPENEX  HFA  Inhale 1 puff into the lungs every 4 (four) hours as needed for wheezing or shortness of breath.  15 g  1     meclizine  25 mg tablet Commonly known as: ANTIVERT   Take 12.5 mg by mouth 3 (three) times a day as needed for dizziness or nausea.   0     metoprolol  tartrate 50 mg tablet Commonly known as: LOPRESSOR   Take 1 tablet (50 mg total) by mouth 2 (two) times a  day.  60 tablet  11     potassium chloride  20 mEq packet Commonly known as: KLOR-CON   Take 20 mEq by mouth daily.   0     Qvar  RediHaler 40 mcg/actuation Generic drug: beclomethasone dipropionate   At onset of respiratory illness/asthma flare: Inhale 2 puffs once daily with spacer for 1-2 weeks or until symptoms resolve.   0           Discharge Instructions: Activity: As tolerated   Condition on Discharge: Stable  Discharge Disposition: Discharged to: home    Follow Up Appointments: Future Appointments  Date Time Provider Department Center  06/24/2024  9:00 AM Shelli Aloysius Snuffer, MD [name] LE [facility]  07/02/2024 11:00 AM Selinda Hamilton Delta, PA-C [facility] SM LEX [facility] [place]  08/05/2024  2:00 PM [facility] CARD MRI1 [facility] CAR MR [facility]  10/08/2024 10:00 AM Prentice KANDICE Greener, MD [facility] None  10/14/2024  2:00 PM Ronal Myra Shells, MD [facility] LEX [facility] [address] [place]   Contact information for after-discharge care  Home Medical Care     [facility]  Service: Home Rehabilitation   [address] [place] [address]  Phone: [phone]              Discharge Orders     Activity Instructions:     Details:    Activity Limits: As you are able   Ambulatory referral to PCP     Discharge Diet (specify)     Details:    Diet type: Mediterranean   Full Code     Lifting Limits:     Details:    Lifting Limits: No lifting limits   Physical Therapy Home Health Coordination     Details:    Actions:  Evaluate and Treat Home Safety Evaluation           This discharge process has taken greater than 30 minutes  Signed Electronically:   Calvert Newport, MD 10:23 AM  06/14/2024

## 2024-06-15 DIAGNOSIS — I340 Nonrheumatic mitral (valve) insufficiency: Secondary | ICD-10-CM | POA: Diagnosis not present

## 2024-06-16 ENCOUNTER — Encounter: Payer: Self-pay | Admitting: Family Medicine

## 2024-06-16 ENCOUNTER — Telehealth (INDEPENDENT_AMBULATORY_CARE_PROVIDER_SITE_OTHER): Admitting: Family Medicine

## 2024-06-16 VITALS — BP 155/88

## 2024-06-16 DIAGNOSIS — G4733 Obstructive sleep apnea (adult) (pediatric): Secondary | ICD-10-CM

## 2024-06-16 DIAGNOSIS — R2681 Unsteadiness on feet: Secondary | ICD-10-CM | POA: Diagnosis not present

## 2024-06-16 DIAGNOSIS — K3189 Other diseases of stomach and duodenum: Secondary | ICD-10-CM | POA: Diagnosis not present

## 2024-06-16 DIAGNOSIS — R1310 Dysphagia, unspecified: Secondary | ICD-10-CM | POA: Diagnosis not present

## 2024-06-16 DIAGNOSIS — M79602 Pain in left arm: Secondary | ICD-10-CM | POA: Diagnosis not present

## 2024-06-16 DIAGNOSIS — J4520 Mild intermittent asthma, uncomplicated: Secondary | ICD-10-CM | POA: Diagnosis not present

## 2024-06-16 DIAGNOSIS — I10 Essential (primary) hypertension: Secondary | ICD-10-CM

## 2024-06-16 DIAGNOSIS — J3489 Other specified disorders of nose and nasal sinuses: Secondary | ICD-10-CM | POA: Diagnosis not present

## 2024-06-16 MED ORDER — MOMETASONE FUROATE 50 MCG/ACT NA SUSP: 2.0000 | Freq: Every day | NASAL | 2 refills | Status: AC

## 2024-06-16 NOTE — Assessment & Plan Note (Signed)
 BP is high today and has been running high. We dicussed bumping the metoprolol  back up to 50mg  or at least 25mg  plus a half a tab ( she is nervous about going up to 50mg ).  She worried about her bottom number going into the 50s.  I did reassure her that I am not concerned about a diastolic in the 50s at all.  If she does see that then she is probably a little on the dry side and probably needs to increase her fluid intake and drinking a glass of water  but otherwise it is not enough that I would hold medications.  Especially with her diastolic primarily running in the 150s.  And today diastolic is in the 80s.  We also discussed that clonidine  can be used twice a day on a routine basis as a blood pressure controller versus as needed so that is something we could certainly circle back to highly encouraged her to keep track of her blood pressures and keep a log so that we can look over them knowing that there are some that might be low and some that are going to be excessively high but looking to see if the majority of them are in range.  Right now I think most of her pressures are not at goal and that is probably most likely contributing to some of her headaches and facial flushing.

## 2024-06-16 NOTE — Progress Notes (Signed)
 MyChart Video Visit    Virtual Visit via Video Note   This format is felt to be most appropriate for this patient at this time. Physical exam was limited by quality of the video and audio technology used for the visit.    Patient location: home Provider location: [place] [facility] Persons involved in the visit: patient, provider  I discussed the limitations of evaluation and management by telemedicine and the availability of in person appointments. The patient expressed understanding and agreed to proceed.  Patient: Jennifer [name]   DOB: Aug 07, 1966   58 y.o. Female  MRN: [id number] Visit Date: 06/16/2024  Today's healthcare provider: Dorothyann Byars, MD   Chief Complaint  Patient presents with   Hospitalization Follow-up    Subjective:    HPI  Had HA and blurry vision, and left arm pain and went back to the ED. They kept her overnight.  Saw EP doctor.    Sinuses pressure and tenderness.  Using saline rinses and astelin  and fluticasone .    When she gets a HA her BP shoots up.  Using clonidine  PRN for higher BPs. She is wondering if her stress levels are affecting this.   Worried about having to drive her husband to the ablation.    Left arm pain down her lateral arm and affects her 3rd, 4th and 5th fingers.  No numbness or tingling  And occ radiates towards her breast.  Troponin are normal.  Occ feels weakness.    Has felt more flushed  and now HA is frontal . Bps have been up overall but feels like a little better than when in the hospital.      ROS        Objective:    BP (!) 155/88   LMP 06/25/2013        Physical Exam Vitals and nursing note reviewed.  Constitutional:      Appearance: Normal appearance.  HENT:     Head: Normocephalic and atraumatic.  Eyes:     Conjunctiva/sclera: Conjunctivae normal.  Cardiovascular:     Rate and Rhythm: Normal rate and regular rhythm.   Pulmonary:     Effort: Pulmonary effort is normal.     Breath sounds: Normal breath sounds.  Skin:    General: Skin is warm and dry.  Neurological:     Mental Status: She is alert.  Psychiatric:        Mood and Affect: Mood normal.         Assessment & Plan:    Problem List Items Addressed This Visit       Cardiovascular and Mediastinum   Low-renin essential hypertension - Primary   BP is high today and has been running high. We dicussed bumping the metoprolol  back up to 50mg  or at least 25mg  plus a half a tab ( she is nervous about going up to 50mg ).  She worried about her bottom number going into the 50s.  I did reassure her that I am not concerned about a diastolic in the 50s at all.  If she does see that then she is probably a little on the dry side and probably needs to increase her fluid intake and drinking a glass of water  but otherwise it is not enough that I would hold medications.  Especially with her diastolic primarily running in the 150s.  And today diastolic is in the 80s.  We also discussed that clonidine  can be used twice a day on a routine basis as a blood pressure controller versus as needed so that is something we could certainly circle back to highly encouraged her to keep track of her blood pressures and keep a log so that we can look over them knowing that there are some that might be low and some that are going to be excessively high but looking to see if the majority of them are in range.  Right now I think most of her pressures are not at goal and that is probably most likely contributing to some of her headaches and facial flushing.        Respiratory   OSA (obstructive sleep apnea)   Relevant Orders   Ambulatory referral to Allergy    Mild intermittent asthma without complication   Relevant Orders   Ambulatory referral to Allergy      Digestive   Dysphagia   Relevant Orders   Ambulatory referral to Gastroenterology     Other   Left arm pain   Other  Visit Diagnoses       Sinus pressure       Relevant Medications   mometasone  (NASONEX ) 50 MCG/ACT nasal spray   Other Relevant Orders   Ambulatory referral to ENT     Gastric dysmotility       Relevant Orders   Ambulatory referral to Gastroenterology     Gait instability       Relevant Orders   Ambulatory referral to Home Health       Assessment and Plan Assessment & Plan   Left arm pain-it sounds like it is more radicular in nature especially since it is going down into her lateral fingers she is not getting any paresthesias with that just the pain most likely coming from her neck or maybe even her shoulder.  I could better access this on an exam but we could see if we could try to get her in with a sports med doc closer to [place] where the transportation issue would be a lot better.   Gait instability-will see if we can get home health coming out to do some PT with her and provide some assistance and see if they could make any recommendations for things around the house that might make it easier for her to maneuver especially since more recently she is having significant difficulty and is having to consistently use her walker.  Sinus pain pressure-will try switching the fluticasone  to mometasone  and see if that is helpful.  She recently had a head CT and MRI which did not indicate a sinus infection so I do think the pressure and tenderness and drainage is unrelated to active infection probably just pressure  Meds ordered this encounter  Medications   mometasone  (NASONEX ) 50 MCG/ACT nasal spray    Sig: Place 2 sprays into the nose daily.    Dispense:  1 each    Refill:  2     No follow-ups on file.     I discussed the assessment and treatment plan with the patient. The patient was provided an opportunity to ask questions and all were answered. The patient agreed with the plan and demonstrated an understanding of the instructions.   The patient was advised to call back  or seek an in-person evaluation if the symptoms worsen or if the condition fails to improve as anticipated.  I provided 40 minutes of non-face-to-face  time during this encounter.  Dorothyann Byars, MD [facility] at [facility]

## 2024-06-17 DIAGNOSIS — M79602 Pain in left arm: Secondary | ICD-10-CM | POA: Diagnosis not present

## 2024-06-17 DIAGNOSIS — E119 Type 2 diabetes mellitus without complications: Secondary | ICD-10-CM | POA: Diagnosis not present

## 2024-06-17 DIAGNOSIS — I10 Essential (primary) hypertension: Secondary | ICD-10-CM | POA: Diagnosis not present

## 2024-06-17 DIAGNOSIS — R1310 Dysphagia, unspecified: Secondary | ICD-10-CM | POA: Diagnosis not present

## 2024-06-17 DIAGNOSIS — E785 Hyperlipidemia, unspecified: Secondary | ICD-10-CM | POA: Diagnosis not present

## 2024-06-17 DIAGNOSIS — R9431 Abnormal electrocardiogram [ECG] [EKG]: Secondary | ICD-10-CM | POA: Diagnosis not present

## 2024-06-17 DIAGNOSIS — K219 Gastro-esophageal reflux disease without esophagitis: Secondary | ICD-10-CM | POA: Diagnosis not present

## 2024-06-17 LAB — LAB REPORT - SCANNED

## 2024-06-19 ENCOUNTER — Ambulatory Visit: Admitting: Family Medicine

## 2024-06-24 DIAGNOSIS — I159 Secondary hypertension, unspecified: Secondary | ICD-10-CM | POA: Diagnosis not present

## 2024-06-24 DIAGNOSIS — Z87891 Personal history of nicotine dependence: Secondary | ICD-10-CM | POA: Diagnosis not present

## 2024-06-24 DIAGNOSIS — R0789 Other chest pain: Secondary | ICD-10-CM | POA: Diagnosis not present

## 2024-06-25 ENCOUNTER — Ambulatory Visit: Payer: Self-pay | Admitting: *Deleted

## 2024-06-25 DIAGNOSIS — G35D Multiple sclerosis, unspecified: Secondary | ICD-10-CM | POA: Diagnosis not present

## 2024-06-25 DIAGNOSIS — Z9181 History of falling: Secondary | ICD-10-CM | POA: Diagnosis not present

## 2024-06-25 DIAGNOSIS — I499 Cardiac arrhythmia, unspecified: Secondary | ICD-10-CM | POA: Diagnosis not present

## 2024-06-25 DIAGNOSIS — F4312 Post-traumatic stress disorder, chronic: Secondary | ICD-10-CM | POA: Diagnosis not present

## 2024-06-25 DIAGNOSIS — Z79899 Other long term (current) drug therapy: Secondary | ICD-10-CM | POA: Diagnosis not present

## 2024-06-25 DIAGNOSIS — G4733 Obstructive sleep apnea (adult) (pediatric): Secondary | ICD-10-CM | POA: Diagnosis not present

## 2024-06-25 DIAGNOSIS — H538 Other visual disturbances: Secondary | ICD-10-CM | POA: Diagnosis not present

## 2024-06-25 DIAGNOSIS — I160 Hypertensive urgency: Secondary | ICD-10-CM | POA: Diagnosis not present

## 2024-06-25 DIAGNOSIS — R1310 Dysphagia, unspecified: Secondary | ICD-10-CM | POA: Diagnosis not present

## 2024-06-25 DIAGNOSIS — I10 Essential (primary) hypertension: Secondary | ICD-10-CM | POA: Diagnosis not present

## 2024-06-25 DIAGNOSIS — J4520 Mild intermittent asthma, uncomplicated: Secondary | ICD-10-CM | POA: Diagnosis not present

## 2024-06-25 NOTE — Telephone Encounter (Signed)
  FYI Only or Action Required?: Action required by provider: update on patient condition.  Patient was last seen in primary care on 06/16/2024 by Alvan Dorothyann BIRCH, MD.  Called Nurse Triage reporting No chief complaint on file..   Triage Disposition: Information or Advice Only Call  Patient/caregiver understands and will follow disposition?: Unsure  Copied from CRM [id number]. Topic: Clinical - Red Word Triage >> Jun 25, 2024  1:30 PM Jennifer [name] wrote: [facility] that prompted transfer to Nurse Triage:   Caller/Agency: tracy with [facility] Number: [phone] Service Requested: Skilled Nursing Frequency:   Her anxiety drives her blood pressure up 168/88. Talking really fast, very angry about healthcare experiences. Reason for Disposition  [1] Follow-up call to recent contact AND [2] information only call, no triage required  Answer Assessment - Initial Assessment Questions 1. REASON FOR CALL: What is the main reason for your call? or How can I best help you?     Assessment call from [place] Nurse: [facility] visit today concerns: BP 168/88- reports patient seems to be in a constant anxious state. She is angry and anxious, fast speaking and changes subject quickly. Constant state of being worked up.- anger toward health care/providers. BP taken at 10:45 reports medication taken at 9:00 No home readings to report- patient has 3 cuffs -but declined to let nurse look at history. Advised to check twice daily and record( not sure patient will comply) Next [facility] nurse visit is scheduled for next week. Patient is not currently seeing therapist due to move- could not supply a new provider name.  Protocols used: Information Only Call - No Triage-A-AH

## 2024-06-25 NOTE — Telephone Encounter (Unsigned)
 Copied from CRM #[id number]. Topic: Clinical - Home Health Verbal Orders >> Jun 25, 2024  1:26 PM Rosaria BRAVO wrote: Caller/Agency: tracy with [facility] Number: [phone] Service Requested: Skilled Nursing Frequency:   1w4 for education on hypertension   Any new concerns about the patient? Yes.  Her anxiety drives her blood pressure up 168/88. Talking really fast, very angry about healthcare experiences. Transferred to NT

## 2024-06-26 ENCOUNTER — Telehealth: Payer: Self-pay | Admitting: Family Medicine

## 2024-06-26 ENCOUNTER — Encounter: Payer: Self-pay | Admitting: Family Medicine

## 2024-06-26 DIAGNOSIS — E611 Iron deficiency: Secondary | ICD-10-CM | POA: Diagnosis not present

## 2024-06-26 DIAGNOSIS — R0789 Other chest pain: Secondary | ICD-10-CM | POA: Diagnosis not present

## 2024-06-26 DIAGNOSIS — R079 Chest pain, unspecified: Secondary | ICD-10-CM | POA: Diagnosis not present

## 2024-06-26 DIAGNOSIS — R072 Precordial pain: Secondary | ICD-10-CM | POA: Diagnosis not present

## 2024-06-26 DIAGNOSIS — I10 Essential (primary) hypertension: Secondary | ICD-10-CM | POA: Diagnosis not present

## 2024-06-26 LAB — BASIC METABOLIC PANEL (CC13)

## 2024-06-26 NOTE — Telephone Encounter (Signed)
 Pt called in requested er labs be looked at, specifically the BMP. States that they are really bad.

## 2024-06-27 ENCOUNTER — Encounter: Payer: Self-pay | Admitting: Family Medicine

## 2024-06-27 ENCOUNTER — Telehealth: Payer: Self-pay | Admitting: Family Medicine

## 2024-06-27 DIAGNOSIS — I471 Supraventricular tachycardia, unspecified: Secondary | ICD-10-CM | POA: Diagnosis not present

## 2024-06-27 DIAGNOSIS — R0789 Other chest pain: Secondary | ICD-10-CM | POA: Diagnosis not present

## 2024-06-27 NOTE — Telephone Encounter (Signed)
 Looked over the BMP and did not see anything concerning.  The creatinine said low but we do not worry if it is low we only worry if it is high.

## 2024-06-27 NOTE — Telephone Encounter (Signed)
 Copied from CRM #[id number]. Topic: General - Other >> Jun 27, 2024 11:34 AM Donee H wrote: Reason for CRM: Anselm from [facility] called to state went to evaluate patient today but was  unable to due so. Patient was in ER last night and requested for him to come back on Monday, Nov.17 for the evaluation. He stated just wanted to inform pcp of the delay.   Any further questions he can be reached at [phone]

## 2024-06-30 ENCOUNTER — Telehealth: Payer: Self-pay | Admitting: Family Medicine

## 2024-06-30 DIAGNOSIS — R55 Syncope and collapse: Secondary | ICD-10-CM | POA: Diagnosis not present

## 2024-06-30 DIAGNOSIS — I10 Essential (primary) hypertension: Secondary | ICD-10-CM | POA: Diagnosis not present

## 2024-06-30 DIAGNOSIS — Z87891 Personal history of nicotine dependence: Secondary | ICD-10-CM | POA: Diagnosis not present

## 2024-06-30 DIAGNOSIS — J383 Other diseases of vocal cords: Secondary | ICD-10-CM

## 2024-06-30 DIAGNOSIS — Z20822 Contact with and (suspected) exposure to covid-19: Secondary | ICD-10-CM | POA: Diagnosis not present

## 2024-06-30 DIAGNOSIS — J4520 Mild intermittent asthma, uncomplicated: Secondary | ICD-10-CM

## 2024-06-30 DIAGNOSIS — R0789 Other chest pain: Secondary | ICD-10-CM | POA: Diagnosis not present

## 2024-06-30 DIAGNOSIS — R42 Dizziness and giddiness: Secondary | ICD-10-CM | POA: Diagnosis not present

## 2024-06-30 DIAGNOSIS — K220 Achalasia of cardia: Secondary | ICD-10-CM

## 2024-06-30 NOTE — Telephone Encounter (Signed)
 Reprinted orders from July and we can fax to [facility] in [place].     Orders Placed This Encounter  Procedures   Ambulatory referral to Pulmonology    Referral Priority:   Routine    Referral Type:   Consultation    Referral Reason:   Specialty Services Required    Requested Specialty:   Pulmonary Disease    Number of Visits Requested:   1

## 2024-06-30 NOTE — Telephone Encounter (Signed)
 Patient is requesting mammogram with US  orders be sent to somewhere in [place] or [place]. Also, needs a referral to Pulmonology instead of Allergy /Asthma since there is no Allergy /Asthma in [place]. She does not want to go to [facility] due to transportation.

## 2024-07-01 ENCOUNTER — Telehealth: Payer: Self-pay

## 2024-07-01 NOTE — Telephone Encounter (Signed)
 Copied from CRM #[id number]. Topic: General - Other >> Jul 01, 2024  8:10 AM Larissa RAMAN wrote: Reason for CRM: Okey, PT with [facility] states that PT and OT will evaluate next week per patient request due to patient going to ED for 4 days due to her BP.  Callback # [phone]

## 2024-07-02 ENCOUNTER — Telehealth: Payer: Self-pay

## 2024-07-02 DIAGNOSIS — S4352XA Sprain of left acromioclavicular joint, initial encounter: Secondary | ICD-10-CM | POA: Diagnosis not present

## 2024-07-02 DIAGNOSIS — M7062 Trochanteric bursitis, left hip: Secondary | ICD-10-CM | POA: Diagnosis not present

## 2024-07-02 DIAGNOSIS — M25512 Pain in left shoulder: Secondary | ICD-10-CM | POA: Diagnosis not present

## 2024-07-02 DIAGNOSIS — S8002XA Contusion of left knee, initial encounter: Secondary | ICD-10-CM | POA: Diagnosis not present

## 2024-07-02 DIAGNOSIS — I10 Essential (primary) hypertension: Secondary | ICD-10-CM | POA: Diagnosis not present

## 2024-07-02 LAB — LAB REPORT - SCANNED

## 2024-07-02 NOTE — Telephone Encounter (Signed)
 Call received from [facility] with [facility] radiology  States they received the order for the breast US  but not the order for the diagnositc breast MMG bilateral  Orders were re-printed and faxed  to [phone] Be[facility] Harvey as in message from Dr. Alvan documented on 06/30/2024 .

## 2024-07-03 ENCOUNTER — Ambulatory Visit: Admitting: Family Medicine

## 2024-07-03 ENCOUNTER — Encounter: Payer: Self-pay | Admitting: Family Medicine

## 2024-07-03 ENCOUNTER — Telehealth (INDEPENDENT_AMBULATORY_CARE_PROVIDER_SITE_OTHER): Admitting: Family Medicine

## 2024-07-03 ENCOUNTER — Telehealth: Payer: Self-pay | Admitting: Family Medicine

## 2024-07-03 DIAGNOSIS — I10 Essential (primary) hypertension: Secondary | ICD-10-CM | POA: Diagnosis not present

## 2024-07-03 DIAGNOSIS — J019 Acute sinusitis, unspecified: Secondary | ICD-10-CM

## 2024-07-03 DIAGNOSIS — C519 Malignant neoplasm of vulva, unspecified: Secondary | ICD-10-CM | POA: Diagnosis not present

## 2024-07-03 DIAGNOSIS — I160 Hypertensive urgency: Secondary | ICD-10-CM | POA: Diagnosis not present

## 2024-07-03 DIAGNOSIS — E1169 Type 2 diabetes mellitus with other specified complication: Secondary | ICD-10-CM | POA: Diagnosis not present

## 2024-07-03 DIAGNOSIS — E785 Hyperlipidemia, unspecified: Secondary | ICD-10-CM | POA: Diagnosis not present

## 2024-07-03 DIAGNOSIS — G35D Multiple sclerosis, unspecified: Secondary | ICD-10-CM

## 2024-07-03 DIAGNOSIS — I479 Paroxysmal tachycardia, unspecified: Secondary | ICD-10-CM | POA: Diagnosis not present

## 2024-07-03 DIAGNOSIS — F331 Major depressive disorder, recurrent, moderate: Secondary | ICD-10-CM

## 2024-07-03 DIAGNOSIS — R1310 Dysphagia, unspecified: Secondary | ICD-10-CM | POA: Diagnosis not present

## 2024-07-03 MED ORDER — CLINDAMYCIN HCL 75 MG PO CAPS: 75.0000 mg | ORAL_CAPSULE | Freq: Four times a day (QID) | ORAL | 0 refills | Status: AC

## 2024-07-03 NOTE — Progress Notes (Signed)
 Called pt to do prescreening no answer.

## 2024-07-03 NOTE — Assessment & Plan Note (Addendum)
 I am hopeful that we can get her back in in person maybe in the new year so that we can get foot exam and urine microalbumin etc. up to date.  Lab Results  Component Value Date   HGBA1C 6.6 (A) 07/20/2023     Lab Results  Component Value Date   CHOL 203 (H) 09/14/2023   HDL 39 (L) 09/14/2023   LDLCALC 133 (H) 09/14/2023   LDLDIRECT 110 (H) 08/19/2012   TRIG 173 (H) 09/14/2023   CHOLHDL 5.2 (H) 09/14/2023

## 2024-07-03 NOTE — Telephone Encounter (Signed)
 Please call patient: I usually see her about every 2 weeks her next appointment I think is December 10 so we need to schedule a video visit later at the end of December and then go ahead and schedule 2 of them in January.

## 2024-07-03 NOTE — Assessment & Plan Note (Addendum)
 Had follow-up with Gyn Onc at [facility] in July of this year.

## 2024-07-03 NOTE — Assessment & Plan Note (Signed)
 I am okay with us  switching to clonidine  just did discuss that she would have to take it regularly she would not be able to just use it inconsistently as it can cause a rebound phenomenon.  I would really like to get the sinus infection cleared up before we make a switch and because she is not feeling well.  But certainly I think we could try to see if she tolerates the clonidine  better she also feels like it kind of relaxes her and she will start out a little bit more which would be positive.

## 2024-07-03 NOTE — Assessment & Plan Note (Signed)
 If you rate the clonidine  well then could consider just using the metoprolol  as needed for tachycardic episodes.

## 2024-07-03 NOTE — Assessment & Plan Note (Signed)
 BMI greater than 40 with comorbidities including hypertension.  Continue to work on [employer].

## 2024-07-03 NOTE — Progress Notes (Addendum)
 Virtual Visit via Video Note  I connected with Jennifer [name] on 07/04/24 at  3:40 PM EST by a video enabled telemedicine application and verified that I am speaking with the correct person using two identifiers.   I discussed the limitations of evaluation and management by telemedicine and the availability of in person appointments. The patient expressed understanding and agreed to proceed.  Patient location: at home Provider location: in office  Subjective:    CC:   Chief Complaint  Patient presents with   Follow-up    HPI:  Has a couple concerns today.  For couple months she has been having a little bit more increase in sinus symptoms but more recently she has had a lot more ear pressure she is getting a lot more congestion she has been using her nasal saline rinses as well as using her Astelin  and nasal steroid spray.  She now feels like her throat is really sore and that her upper neck and jaw are really tender to touch.  She feels like her face is puffy and swollen as well.  She says her uvula feels swollen like it is touching the back of her tongue.  She has been getting some tan and green-colored mucus.  She is also noticed that the skin around her eyes is a little bit more red scaly and itchy she is not sure why.  She did go to the ED on November 17.  She said that morning she took her metoprolol  like she usually does and just felt bad her blood pressure was not coming down and so she ended up going to the emergency department.  Later that afternoon they ended up giving her clonidine  she says it made her feel little tired but she is wondering if she actually tolerates the clonidine  better.  She feels that the metoprolol  actually makes her dizzy she has been on it for several years now and just wonders if she could feel better with the clonidine .  She does have a heart monitor in place.  Past medical history, Surgical history, Family history not pertinant except as noted  below, Social history, Allergies, and medications have been entered into the medical record, reviewed, and corrections made.    Objective:    General: Speaking clearly in complete sentences without any shortness of breath.  Alert and oriented x3.  Normal judgment. No apparent acute distress.    Impression and Recommendations:    Problem List Items Addressed This Visit       Cardiovascular and Mediastinum   Paroxysmal tachycardia (HCC)   If you rate the clonidine  well then could consider just using the metoprolol  as needed for tachycardic episodes.      Low-renin essential hypertension   I am okay with us  switching to clonidine  just did discuss that she would have to take it regularly she would not be able to just use it inconsistently as it can cause a rebound phenomenon.  I would really like to get the sinus infection cleared up before we make a switch and because she is not feeling well.  But certainly I think we could try to see if she tolerates the clonidine  better she also feels like it kind of relaxes her and she will start out a little bit more which would be positive.        Endocrine   Hyperlipidemia associated with type 2 diabetes mellitus (HCC)   I am hopeful that we can get her back in  in person maybe in the new year so that we can get foot exam and urine microalbumin etc. up to date.  Lab Results  Component Value Date   HGBA1C 6.6 (A) 07/20/2023     Lab Results  Component Value Date   CHOL 203 (H) 09/14/2023   HDL 39 (L) 09/14/2023   LDLCALC 133 (H) 09/14/2023   LDLDIRECT 110 (H) 08/19/2012   TRIG 173 (H) 09/14/2023   CHOLHDL 5.2 (H) 09/14/2023           Nervous and Auditory   Multiple sclerosis   Diagnosed previously by neurology but has not seen a neurologist in this regard in the last several years        Musculoskeletal and Integument   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HCC)   Had follow-up with Gyn Onc at [facility] in July of this year.      Relevant  Medications   clindamycin  (CLEOCIN ) 75 MG capsule     Other   Morbid obesity (HCC)   BMI greater than 40 with comorbidities including hypertension.  Continue to work on [employer].      Moderate episode of recurrent major depressive disorder [facility])   Was previously engaging with a therapist and did find that helpful.  Not actively engaged in therapy now.      Other Visit Diagnoses       Acute non-recurrent sinusitis, unspecified location    -  Primary   Relevant Medications   clindamycin  (CLEOCIN ) 75 MG capsule      acute sinusitis-will treat with clindamycin  she is only able to tolerate a low dose so we did send over 75 mg capsule to take 4 times a day.  If not better in 1 week please let us  know.     Orders Placed This Encounter  Procedures   Hemoglobin A1c   Urine Microalbumin w/creat. ratio   Hemoglobin A1c    This external order was created through the Results Console.    Meds ordered this encounter  Medications   clindamycin  (CLEOCIN ) 75 MG capsule    Sig: Take 1 capsule (75 mg total) by mouth 4 (four) times daily for 7 days.    Dispense:  28 capsule    Refill:  0     I discussed the assessment and treatment plan with the patient. The patient was provided an opportunity to ask questions and all were answered. The patient agreed with the plan and demonstrated an understanding of the instructions.   The patient was advised to call back or seek an in-person evaluation if the symptoms worsen or if the condition fails to improve as anticipated.   Dorothyann Byars, MD

## 2024-07-03 NOTE — Assessment & Plan Note (Signed)
 Was previously engaging with a therapist and did find that helpful.  Not actively engaged in therapy now.

## 2024-07-03 NOTE — Assessment & Plan Note (Signed)
 Diagnosed previously by neurology but has not seen a neurologist in this regard in the last several years

## 2024-07-04 ENCOUNTER — Telehealth: Payer: Self-pay

## 2024-07-04 NOTE — Telephone Encounter (Signed)
Okay for OT? ?

## 2024-07-04 NOTE — Telephone Encounter (Signed)
 Attempted call to [facility] with [facility] health to ask if patient had informed them of why they were refusing OT .

## 2024-07-04 NOTE — Telephone Encounter (Unsigned)
 Copied from CRM [id number]. Topic: General - Other >> Jul 03, 2024  4:09 PM Kevelyn M wrote: Reason for CRM: Anselm with [facility] home health calling because patient is refusing occupational therapy.  Call back # [phone]

## 2024-07-07 DIAGNOSIS — R42 Dizziness and giddiness: Secondary | ICD-10-CM | POA: Diagnosis not present

## 2024-07-07 DIAGNOSIS — Z79899 Other long term (current) drug therapy: Secondary | ICD-10-CM | POA: Diagnosis not present

## 2024-07-07 DIAGNOSIS — R079 Chest pain, unspecified: Secondary | ICD-10-CM | POA: Diagnosis not present

## 2024-07-07 DIAGNOSIS — I10 Essential (primary) hypertension: Secondary | ICD-10-CM | POA: Diagnosis not present

## 2024-07-07 DIAGNOSIS — G4733 Obstructive sleep apnea (adult) (pediatric): Secondary | ICD-10-CM | POA: Diagnosis not present

## 2024-07-07 DIAGNOSIS — I25118 Atherosclerotic heart disease of native coronary artery with other forms of angina pectoris: Secondary | ICD-10-CM | POA: Diagnosis not present

## 2024-07-07 DIAGNOSIS — I472 Ventricular tachycardia, unspecified: Secondary | ICD-10-CM | POA: Diagnosis not present

## 2024-07-07 DIAGNOSIS — Z87891 Personal history of nicotine dependence: Secondary | ICD-10-CM | POA: Diagnosis not present

## 2024-07-07 NOTE — Telephone Encounter (Signed)
 Spoke with Anselm with [facility] home health. States patient has refused OT  - states she can do everything for herself and does not want OT at this time  But that she might consider PT instead.

## 2024-07-08 LAB — LAB REPORT - SCANNED

## 2024-07-09 ENCOUNTER — Ambulatory Visit: Admitting: Family Medicine

## 2024-07-09 DIAGNOSIS — I471 Supraventricular tachycardia, unspecified: Secondary | ICD-10-CM | POA: Diagnosis not present

## 2024-07-13 DIAGNOSIS — R0989 Other specified symptoms and signs involving the circulatory and respiratory systems: Secondary | ICD-10-CM | POA: Diagnosis not present

## 2024-07-13 DIAGNOSIS — D509 Iron deficiency anemia, unspecified: Secondary | ICD-10-CM | POA: Diagnosis not present

## 2024-07-13 DIAGNOSIS — R519 Headache, unspecified: Secondary | ICD-10-CM | POA: Diagnosis not present

## 2024-07-13 DIAGNOSIS — R000 Tachycardia, unspecified: Secondary | ICD-10-CM | POA: Diagnosis not present

## 2024-07-13 DIAGNOSIS — R0789 Other chest pain: Secondary | ICD-10-CM | POA: Diagnosis not present

## 2024-07-13 DIAGNOSIS — F411 Generalized anxiety disorder: Secondary | ICD-10-CM | POA: Diagnosis not present

## 2024-07-13 DIAGNOSIS — F419 Anxiety disorder, unspecified: Secondary | ICD-10-CM | POA: Diagnosis not present

## 2024-07-13 DIAGNOSIS — I472 Ventricular tachycardia, unspecified: Secondary | ICD-10-CM | POA: Diagnosis not present

## 2024-07-13 DIAGNOSIS — E1165 Type 2 diabetes mellitus with hyperglycemia: Secondary | ICD-10-CM | POA: Diagnosis not present

## 2024-07-13 DIAGNOSIS — I10 Essential (primary) hypertension: Secondary | ICD-10-CM | POA: Diagnosis not present

## 2024-07-13 DIAGNOSIS — R42 Dizziness and giddiness: Secondary | ICD-10-CM | POA: Diagnosis not present

## 2024-07-13 DIAGNOSIS — Z87891 Personal history of nicotine dependence: Secondary | ICD-10-CM | POA: Diagnosis not present

## 2024-07-13 DIAGNOSIS — R079 Chest pain, unspecified: Secondary | ICD-10-CM | POA: Diagnosis not present

## 2024-07-14 DIAGNOSIS — R079 Chest pain, unspecified: Secondary | ICD-10-CM | POA: Diagnosis not present

## 2024-07-16 DIAGNOSIS — R0789 Other chest pain: Secondary | ICD-10-CM | POA: Diagnosis not present

## 2024-07-16 DIAGNOSIS — R000 Tachycardia, unspecified: Secondary | ICD-10-CM | POA: Diagnosis not present

## 2024-07-16 DIAGNOSIS — I1A0 Resistant hypertension: Secondary | ICD-10-CM | POA: Diagnosis not present

## 2024-07-16 DIAGNOSIS — I10 Essential (primary) hypertension: Secondary | ICD-10-CM | POA: Diagnosis not present

## 2024-07-16 DIAGNOSIS — E876 Hypokalemia: Secondary | ICD-10-CM | POA: Diagnosis not present

## 2024-07-17 ENCOUNTER — Telehealth: Payer: Self-pay

## 2024-07-17 NOTE — Progress Notes (Signed)
 Complex Care Management Note Care Guide Note  07/17/2024 Name: BRITTANEY BEAULIEU MRN: [id number] DOB: March 08, 1966   Complex Care Management Outreach Attempts: An unsuccessful telephone outreach was attempted today to offer the patient information about available complex care management services.  Follow Up Plan:  Additional outreach attempts will be made to offer the patient complex care management information and services.   Encounter Outcome:  Patient Request to Call Back  Dreama [facility]wood [facility]  [facility], [facility] VBCI Assistant Direct Dial: [phone]  Fax: [phone]

## 2024-07-18 DIAGNOSIS — N644 Mastodynia: Secondary | ICD-10-CM | POA: Diagnosis not present

## 2024-07-18 LAB — HM MAMMOGRAPHY

## 2024-07-22 NOTE — Progress Notes (Signed)
 Complex Care Management Note Care Guide Note  07/22/2024 Name: Jennifer [name] MRN: [id number] DOB: 19-Dec-1965   Complex Care Management Outreach Attempts: A second unsuccessful outreach was attempted today to offer the patient with information about available complex care management services.  Follow Up Plan:  Additional outreach attempts will be made to offer the patient complex care management information and services.   Encounter Outcome:  No Answer  Jennifer [name]  [facility], [facility] VBCI Assistant Direct Dial: [phone]  Fax: [phone]

## 2024-07-23 ENCOUNTER — Telehealth (INDEPENDENT_AMBULATORY_CARE_PROVIDER_SITE_OTHER): Admitting: Family Medicine

## 2024-07-23 ENCOUNTER — Telehealth: Payer: Self-pay | Admitting: Family Medicine

## 2024-07-23 DIAGNOSIS — Z91199 Patient's noncompliance with other medical treatment and regimen due to unspecified reason: Secondary | ICD-10-CM

## 2024-07-23 NOTE — Telephone Encounter (Signed)
 Patient called to let pcp know she's in the ER because bp is up.

## 2024-07-23 NOTE — Progress Notes (Unsigned)
 Attempted to call pt. No answer lvm asked that she sign on for her appointment.

## 2024-07-24 DIAGNOSIS — G4733 Obstructive sleep apnea (adult) (pediatric): Secondary | ICD-10-CM | POA: Diagnosis not present

## 2024-07-25 ENCOUNTER — Encounter: Payer: Self-pay | Admitting: Family Medicine

## 2024-07-28 NOTE — Progress Notes (Signed)
 Complex Care Management Note Care Guide Note  07/28/2024 Name: Jennifer [name] MRN: [id number] DOB: May 26, 1966   Complex Care Management Outreach Attempts: A third unsuccessful outreach was attempted today to offer the patient with information about available complex care management services.  Follow Up Plan:  No further outreach attempts will be made at this time. We have been unable to contact the patient to offer or enroll patient in complex care management services.  Encounter Outcome:  No Answer  Dreama [facility]wood [facility]  [facility], [facility] VBCI Assistant Direct Dial: [phone]  Fax: [phone]

## 2024-07-31 ENCOUNTER — Telehealth: Admitting: Family Medicine

## 2024-07-31 NOTE — Telephone Encounter (Signed)
 Pt has appt scheduled.

## 2024-08-04 NOTE — Progress Notes (Signed)
 Error. Pt went to ED

## 2024-08-05 ENCOUNTER — Ambulatory Visit: Admitting: Family Medicine

## 2024-08-11 ENCOUNTER — Ambulatory Visit: Admitting: Family Medicine

## 2024-08-19 ENCOUNTER — Telehealth: Admitting: Family Medicine

## 2024-08-19 VITALS — BP 141/74

## 2024-08-19 DIAGNOSIS — I471 Supraventricular tachycardia, unspecified: Secondary | ICD-10-CM

## 2024-08-19 DIAGNOSIS — I10 Essential (primary) hypertension: Secondary | ICD-10-CM | POA: Diagnosis not present

## 2024-08-19 DIAGNOSIS — E271 Primary adrenocortical insufficiency: Secondary | ICD-10-CM | POA: Insufficient documentation

## 2024-08-19 DIAGNOSIS — G44229 Chronic tension-type headache, not intractable: Secondary | ICD-10-CM | POA: Insufficient documentation

## 2024-08-19 DIAGNOSIS — E1169 Type 2 diabetes mellitus with other specified complication: Secondary | ICD-10-CM | POA: Diagnosis not present

## 2024-08-19 DIAGNOSIS — E785 Hyperlipidemia, unspecified: Secondary | ICD-10-CM

## 2024-08-19 MED ORDER — DEXCOM G7 SENSOR MISC: 3 refills | Status: DC

## 2024-08-19 MED ORDER — DEXCOM G7 RECEIVER DEVI: 5 refills | Status: DC

## 2024-08-19 NOTE — Progress Notes (Deleted)
" ° ° °  Virtual Visit via Video Note  I connected with REIGAN TOLLIVER on 08/19/2024 at  1:20 PM EST by a video enabled telemedicine application and verified that I am speaking with the correct person using two identifiers.   I discussed the limitations of evaluation and management by telemedicine and the availability of in person appointments. The patient expressed understanding and agreed to proceed.  Patient location: at home Provider location: in office  Subjective:    CC:  No chief complaint on file.   HPI:    Past medical history, Surgical history, Family history not pertinant except as noted below, Social history, Allergies, and medications have been entered into the medical record, reviewed, and corrections made.    Objective:    General: Speaking clearly in complete sentences without any shortness of breath.  Alert and oriented x3.  Normal judgment. No apparent acute distress.    Impression and Recommendations:    Problem List Items Addressed This Visit   None   No orders of the defined types were placed in this encounter.   No orders of the defined types were placed in this encounter.    I discussed the assessment and treatment plan with the patient. The patient was provided an opportunity to ask questions and all were answered. The patient agreed with the plan and demonstrated an understanding of the instructions.   The patient was advised to call back or seek an in-person evaluation if the symptoms worsen or if the condition fails to improve as anticipated.   Dorothyann Byars, MD   "

## 2024-08-19 NOTE — Progress Notes (Signed)
 "                    MyChart Video Visit    Virtual Visit via Video Note   This format is felt to be most appropriate for this patient at this time. Physical exam was limited by quality of the video and audio technology used for the visit.    Patient location: home Provider location: [place] [facility] Persons involved in the visit: patient, provider  I discussed the limitations of evaluation and management by telemedicine and the availability of in person appointments. The patient expressed understanding and agreed to proceed.  Patient: Jennifer [name]   DOB: March 19, 1966   59 y.o. Female  MRN: [id number] Visit Date: 08/19/2024  Today's healthcare provider: Dorothyann Byars, MD   No chief complaint on file.   Subjective:    HPI  Discussed the use of AI scribe software for clinical note transcription with the patient, who gave verbal consent to proceed.  History of Present Illness Jennifer [name] is a 59 year old female with hypertension who presents with uncontrolled blood pressure and associated symptoms.  Hypertension and associated symptoms - Uncontrolled blood pressure with recent reading of 170/77 mmHg - Episodes of dizziness, headaches, and visual disturbances - Headaches described as 'pumping' and 'firework' sensations, correlating with elevated blood pressure - Symptoms worsen after taking metoprolol  - Blood pressure fluctuations since moving homes, with readings alternating between high and low - Regular use of clonidine , typically twice daily, with dose adjustments based on blood pressure readings - Metoprolol  taken twice daily  Presyncope and syncope - Episodes of feeling 'abnormal' and nearly fainting, especially during activities such as walking or talking - Occasional need to lie down due to chills and feeling unwell - History of passing out for short periods during these episodes - Describes  feeling 'out of it' during symptomatic episodes  Arrhythmias and cardiac monitoring - History of arrhythmias, including SVT and VTAC - Currently wearing a heart monitor to capture arrhythmic events - Experiences 'flutters' and actively presses monitor button during symptomatic episodes - Episodes of low heart rates in the 60s and 70s, with occasional tachycardia  Nocturnal hypoxemia - Decrease in pulse oximetry readings at night, sometimes dropping to 92%  Sleep apnea risk - History of large tongue, increasing risk for sleep apnea - Missed sleep study appointment due to blood pressure issues  Glucose monitoring difficulties - Difficulty with glucose monitoring device due to recall - Seeking replacement for glucose monitoring device   ROS        Objective:    BP (!) 141/74   LMP 06/25/2013       Physical Exam Vitals and nursing note reviewed.  Constitutional:      Appearance: Normal appearance.  HENT:     Head: Normocephalic and atraumatic.  Pulmonary:     Effort: Pulmonary effort is normal.  Skin:    General: Skin is warm and dry.  Neurological:     Mental Status: She is alert.  Psychiatric:        Mood and Affect: Mood normal.         Assessment & Plan:    Problem List Items Addressed This Visit       Cardiovascular and Mediastinum   SVT (supraventricular tachycardia) - Primary   Relevant Medications   cloNIDine  (CATAPRES ) 0.1 MG tablet   Low-renin essential hypertension   Will be seeing endocrinology per ED  referral.       Relevant Medications   cloNIDine  (CATAPRES ) 0.1 MG tablet     Endocrine   Primary adrenocortical insufficiency (HCC)   Hyperlipidemia associated with type 2 diabetes mellitus (HCC)   Relevant Medications   cloNIDine  (CATAPRES ) 0.1 MG tablet   Continuous Glucose Receiver (DEXCOM G7 RECEIVER) DEVI   Continuous Glucose Sensor (DEXCOM G7 SENSOR) MISC     Other   Morbid obesity (HCC)   Chronic tension headaches     Assessment and Plan Assessment & Plan Low-renin essential hypertension Hypertension poorly controlled with readings up to 234/153 mmHg. Symptoms include dizziness, visual disturbances, and headaches. Clonidine  dosing may need adjustment due to short half-life. Insurance limits extended-release clonidine . Endocrinology and neurology referrals in place. - Increase clonidine  to three times daily. - Continue metoprolol  as prescribed. - Follow up with endocrinologist regarding renin levels. - Follow up with neurologist regarding CT changes and stroke risk.  Supraventricular tachycardia Episodes of palpitations and heart rate fluctuations. EKG shows sinus rhythm with arrhythmia, not concerning for SVT or VTAC. - Follow up on heart monitor results.  Chronic tension-type headache Severe headaches associated with hypertension, described as pulsing and firework-like. Differential includes tension-type headache versus migraine. - Continue to monitor headache patterns and correlate with blood pressure readings.  Type 2 diabetes mellitus Issues with Freestyle glucose monitoring system due to recall and insurance coverage. Request for Dexcom system for better monitoring. - Wrote appeal letter for [facility] system due to issues with Freestyle. - Submitted authorization request for Dexcom system.    Meds ordered this encounter  Medications   Continuous Glucose Receiver (DEXCOM G7 RECEIVER) DEVI    Sig: Use device as needed.    Dispense:  1 each    Refill:  5   Continuous Glucose Sensor (DEXCOM G7 SENSOR) MISC    Sig: Use as directed every 15 days    Dispense:  6 each    Refill:  3     No follow-ups on file.     I discussed the assessment and treatment plan with the patient. The patient was provided an opportunity to ask questions and all were answered. The patient agreed with the plan and demonstrated an understanding of the instructions.   The patient was advised to call back or seek an  in-person evaluation if the symptoms worsen or if the condition fails to improve as anticipated.  I provided 25 minutes of non-face-to-face time during this encounter.  Dorothyann Byars, MD [facility] at [facility]    "

## 2024-08-19 NOTE — Assessment & Plan Note (Signed)
 Will be seeing endocrinology per ED referral.

## 2024-08-21 NOTE — Progress Notes (Signed)
 Jennifer [name]                                          MRN: [id number]   08/21/2024   The VBCI Quality Team Specialist reviewed this patient medical record for the purposes of chart review for care gap closure. The following were reviewed: chart review for care gap closure-controlling blood pressure.    VBCI Quality Team

## 2024-08-25 ENCOUNTER — Telehealth: Admitting: Family Medicine

## 2024-08-25 ENCOUNTER — Encounter: Payer: Self-pay | Admitting: Family Medicine

## 2024-08-25 DIAGNOSIS — E118 Type 2 diabetes mellitus with unspecified complications: Secondary | ICD-10-CM

## 2024-08-25 DIAGNOSIS — G901 Familial dysautonomia [Riley-Day]: Secondary | ICD-10-CM

## 2024-08-25 DIAGNOSIS — M25561 Pain in right knee: Secondary | ICD-10-CM

## 2024-08-25 DIAGNOSIS — M25552 Pain in left hip: Secondary | ICD-10-CM | POA: Diagnosis not present

## 2024-08-25 DIAGNOSIS — C519 Malignant neoplasm of vulva, unspecified: Secondary | ICD-10-CM | POA: Diagnosis not present

## 2024-08-25 DIAGNOSIS — I10 Essential (primary) hypertension: Secondary | ICD-10-CM | POA: Diagnosis not present

## 2024-08-25 DIAGNOSIS — M25562 Pain in left knee: Secondary | ICD-10-CM | POA: Diagnosis not present

## 2024-08-25 DIAGNOSIS — E1169 Type 2 diabetes mellitus with other specified complication: Secondary | ICD-10-CM

## 2024-08-25 DIAGNOSIS — G8929 Other chronic pain: Secondary | ICD-10-CM | POA: Diagnosis not present

## 2024-08-25 DIAGNOSIS — R0789 Other chest pain: Secondary | ICD-10-CM

## 2024-08-25 DIAGNOSIS — E785 Hyperlipidemia, unspecified: Secondary | ICD-10-CM

## 2024-08-25 MED ORDER — BLOOD GLUCOSE TEST VI STRP: 1.0000 | ORAL_STRIP | Freq: Every day | 3 refills | Status: DC | PRN

## 2024-08-25 MED ORDER — BLOOD GLUCOSE MONITORING SUPPL DEVI: 1.0000 | Freq: Every day | 0 refills | Status: DC | PRN

## 2024-08-25 MED ORDER — ONETOUCH DELICA PLUS LANCET30G MISC: 99 refills | Status: DC

## 2024-08-25 NOTE — Progress Notes (Unsigned)
 "   Virtual Visit via Video Note  I connected with Jennifer [name] on 08/25/2024 at 10:50 AM EST by a video enabled telemedicine application and verified that I am speaking with the correct person using two identifiers.   I discussed the limitations of evaluation and management by telemedicine and the availability of in person appointments. The patient expressed understanding and agreed to proceed.  Patient location: at home Provider location: in office  Subjective:    CC:   Chief Complaint  Patient presents with   Follow-up    HPI:  Here for virtual appointment today.  She reports that she has not been feeling well.  In fact she woke up today with blurry vision and then eventually started having some chest pain and noting some low back pain.  She checked her blood pressure it was 200/109 she took her regular metoprolol  and half of a clonidine .  Her blood pressure only improved slightly so she took the other half of the clonidine  she says it took a most 2 hours for her blood pressure to come back down and regulate.  She is concerned because she had a recent cardiac scan with some type of dye or contrast that she felt caused her chest to hurt and her low back to hurt this that happened with a similar procedure in the past.  Today she is having pain in her head and her neck and her low back.  Follow-up diabetes-she says she really has not been taking her diabetes very seriously but wants to get back on track.  We discussed when I saw her last time about try to get Dexcom because she had had some any problems with the freestyle libre.  We had sent over a new prescription but she has not heard back on a prior authorization.  She also would like a new prescription for her regular glucometer so that when she does get abnormalities she can still do a fingerstick.  She is worried that she has not had a urine microalbumin done in the last year.  Did also like to get an up-to-date A1c.  She has not been  living in the area locally so we have been mostly communicating being virtual visits.  He also wanted to let me know that she had some dizziness lightheadedness and a cold sweat last night she has been also having some intermittent palpitations.  She did have 1 heart monitor on but had to take it off and now has another 1 on.  She notices her symptoms are more pronounced with standing so she spends a lot of her day sitting or resting.  Past medical history, Surgical history, Family history not pertinant except as noted below, Social history, Allergies, and medications have been entered into the medical record, reviewed, and corrections made.    Objective:    General: Speaking clearly in complete sentences without any shortness of breath.  Alert and oriented x3.  Normal judgment. No apparent acute distress.    Impression and Recommendations:    Problem List Items Addressed This Visit       Cardiovascular and Mediastinum   Low-renin essential hypertension   She reports that she does have a cardiology appointment today that she was able to get into we did discuss that the clonidine  works better if you are taking it consistently because there can be a rebound phenomenon if you are skipping or missing doses or just using it more as needed.  She said that the  cardiologist had tried to get the extended release product, not the patch approved.  Jennifer [name] is worried that the patch product might cause a contact allergy  from the glue.  But was hoping that we could maybe see if we could get the ER product.  She has tried multiple medications in the past and we do have a list of those.        Endocrine   Hyperlipidemia associated with type 2 diabetes mellitus (HCC) - Primary   Continue to work on healthy food choices were still working on trying to get the Dexcom approved I will go ahead and send over a new prescription for glucometer lancets and strips.  Will go ahead and order up-to-date A1c and urine  microalbumin will have to get those labs faxed over to [facility] for her.      Relevant Orders   Urine Microalbumin w/creat. ratio   Hemoglobin A1c   CMP14+EGFR     Other   Chronic pain of both knees   Other Visit Diagnoses       Atypical chest pain         Left hip pain         Diabetes mellitus type 2 with complications (HCC)       Relevant Medications   Blood Glucose Monitoring Suppl DEVI   Glucose Blood (BLOOD GLUCOSE TEST STRIPS) STRP   Lancets (ONETOUCH DELICA PLUS LANCET30G) MISC       Orders Placed This Encounter  Procedures   Urine Microalbumin w/creat. ratio   Hemoglobin A1c   CMP14+EGFR    Meds ordered this encounter  Medications   Blood Glucose Monitoring Suppl DEVI    Sig: 1 each by Does not apply route daily as needed. May substitute to any manufacturer covered by patient's insurance.ONETOUCH METER    Dispense:  1 each    Refill:  0   Glucose Blood (BLOOD GLUCOSE TEST STRIPS) STRP    Sig: 1 each by In Vitro route daily as needed. May substitute to any manufacturer covered by patient's insurance.    Dispense:  100 strip    Refill:  3   Lancets (ONETOUCH DELICA PLUS LANCET30G) MISC    Sig: Check glucose up to every day prn    Dispense:  300 each    Refill:  PRN     I discussed the assessment and treatment plan with the patient. The patient was provided an opportunity to ask questions and all were answered. The patient agreed with the plan and demonstrated an understanding of the instructions.   The patient was advised to call back or seek an in-person evaluation if the symptoms worsen or if the condition fails to improve as anticipated.   Dorothyann Byars, MD   "

## 2024-08-25 NOTE — Assessment & Plan Note (Signed)
 She reports that she does have a cardiology appointment today that she was able to get into we did discuss that the clonidine  works better if you are taking it consistently because there can be a rebound phenomenon if you are skipping or missing doses or just using it more as needed.  She said that the cardiologist had tried to get the extended release product, not the patch approved.  Mariavictoria is worried that the patch product might cause a contact allergy  from the glue.  But was hoping that we could maybe see if we could get the ER product.  She has tried multiple medications in the past and we do have a list of those.

## 2024-08-25 NOTE — Assessment & Plan Note (Signed)
 Continue to work on [employer] choices were still working on trying to get the Dexcom approved I will go ahead and send over a new prescription for glucometer lancets and strips.  Will go ahead and order up-to-date A1c and urine microalbumin will have to get those labs faxed over to [facility] for her.

## 2024-08-26 ENCOUNTER — Telehealth: Payer: Self-pay | Admitting: Pharmacy Technician

## 2024-08-26 ENCOUNTER — Other Ambulatory Visit (HOSPITAL_COMMUNITY): Payer: Self-pay

## 2024-08-26 NOTE — Telephone Encounter (Signed)
 Pharmacy Patient Advocate Encounter   Received notification from [facility] KEY that prior authorization for Dexcom G7 sensors, Dexcom G7 Receiver Device is required/requested.   Insurance verification completed.   The patient is insured through [facility] ADVANTAGE/RX ADVANCE.   Per test claim:  Freestyle Libre 3 plus sensors, Freestyle Libre 3 Reader Device is preferred by [employer].  If suggested medication is appropriate, Please send in a new RX and discontinue this one. If not, please advise as to why it's not appropriate so that we may request a Prior Authorization. Please note, some preferred medications may still require a PA.  If the suggested medications have not been trialed and there are no contraindications to their use, the PA will not be submitted, as it will not be approved.  CMM Key# [id number], [id number]

## 2024-08-26 NOTE — Telephone Encounter (Signed)
 She has tried to libre severl times and had difficulty with the device and stayin on her skin. So we would like to appeal for the DEXCOM

## 2024-08-27 ENCOUNTER — Other Ambulatory Visit (HOSPITAL_COMMUNITY): Payer: Self-pay

## 2024-08-27 NOTE — Telephone Encounter (Signed)
 Prior Authorization request has been submitted. I will provide an update once a determination is received.

## 2024-08-28 ENCOUNTER — Other Ambulatory Visit (HOSPITAL_COMMUNITY): Payer: Self-pay

## 2024-08-28 NOTE — Assessment & Plan Note (Signed)
 Having more issues her knees lately but she has established with an orthopedist.

## 2024-08-28 NOTE — Assessment & Plan Note (Signed)
 Do think her dysautonomia contributes significantly to her episodes of lightheadedness weakness and even difficulty standing at times.

## 2024-08-28 NOTE — Telephone Encounter (Signed)
 Pharmacy Patient Advocate Encounter  Received notification from [facility] ADVANTAGE/RX ADVANCE that Prior Authorization for Dexcom G7 sensors, Dexcom G7 Receiver Device  has been DENIED.  Full denial letter will be uploaded to the media tab. See denial reason below.   PA #/Case ID/Reference #: [id number]  Denied under Medicare Part D. It has been approved under Medicare Part B. Patient will still have access to the medication and will pay a 20% copayment.

## 2024-09-02 ENCOUNTER — Encounter: Payer: Self-pay | Admitting: Family Medicine

## 2024-09-02 ENCOUNTER — Telehealth: Admitting: Family Medicine

## 2024-09-02 ENCOUNTER — Other Ambulatory Visit (HOSPITAL_COMMUNITY): Payer: Self-pay

## 2024-09-02 ENCOUNTER — Telehealth: Payer: Self-pay | Admitting: Family Medicine

## 2024-09-02 DIAGNOSIS — T63304A Toxic effect of unspecified spider venom, undetermined, initial encounter: Secondary | ICD-10-CM | POA: Diagnosis not present

## 2024-09-02 DIAGNOSIS — R7989 Other specified abnormal findings of blood chemistry: Secondary | ICD-10-CM | POA: Diagnosis not present

## 2024-09-02 DIAGNOSIS — I10 Essential (primary) hypertension: Secondary | ICD-10-CM | POA: Diagnosis not present

## 2024-09-02 DIAGNOSIS — E559 Vitamin D deficiency, unspecified: Secondary | ICD-10-CM

## 2024-09-02 MED ORDER — CLONIDINE HCL ER 0.1 MG PO TB12: 0.1000 mg | ORAL_TABLET | Freq: Two times a day (BID) | ORAL | 2 refills | Status: DC

## 2024-09-02 MED ORDER — LANCETS MISC. MISC: 0 refills | Status: DC

## 2024-09-02 NOTE — Telephone Encounter (Signed)
 Pharmacy Patient Advocate Encounter   Received notification from Physician's Office that prior authorization for Kapvay  is required/requested.   Insurance verification completed.   The patient is insured through [facility].   Per test claim: Per test claim, medication is not covered due to plan/benefit exclusion, PA not submitted at this time

## 2024-09-02 NOTE — Progress Notes (Signed)
 "  Acute Office Visit  Patient ID: Jennifer [name], female    DOB: [birth year], 59 y.o.   MRN: [id number]  PCP: Alvan Dorothyann BIRCH, MD  No chief complaint on file.   Subjective:     HPI  Discussed the use of AI scribe software for clinical note transcription with the patient, who gave verbal consent to proceed.  History of Present Illness Jennifer [name] is a 59 year old female with hypertension who presents with uncontrolled blood pressure and chest pain.  Hypertension and blood pressure variability - Uncontrolled blood pressure despite medication adjustments - Takes clonidine  two - three times daily; effect wears off after 3-4 hours - Adjusts clonidine  dosage based on blood pressure readings to avoid emergency department visits - Blood pressure significantly lower when lying down (131/60 mmHg) compared to sitting up (175/85 mmHg) - Experiences lightheadedness and feeling unwell when blood pressure is elevated  Exertional chest pain and fatigue - Chest pain worsened by physical exertion such as cleaning - Physical activity leads to increased blood pressure and fatigue  Possible spider bite and local reaction on left shoulder  - Possible spider bite on shoulder near neck occurred a few days ago - Area was swollen with a pattern of three spots, identified as a spider bite by a friend - Associated symptoms included nausea and stomach discomfort following the bite - Swelling has improved after application of first aid ointment  Vitamin d  deficiency and pill swallowing difficulty - History of vitamin D  deficiency - Recent vitamin D  level of 21 ng/mL - Concern about large size of vitamin D  pills due to difficulty swallowing  Diabetes management challenges - Awaiting prior authorization for continuous glucose monitor and lancets, resulting in delay - Expresses frustration with the process and financial burden of diabetes medications  ED visit and labs from today and  1/8 reviewed.    ROS     Objective:    LMP 06/25/2013    Physical Exam Vitals and nursing note reviewed.  Constitutional:      Appearance: Normal appearance.  HENT:     Head: Normocephalic and atraumatic.  Pulmonary:     Effort: Pulmonary effort is normal.  Neurological:     Mental Status: She is alert and oriented to person, place, and time.  Psychiatric:        Mood and Affect: Mood normal.        Behavior: Behavior normal.       No results found for any visits on 09/02/24.     Assessment & Plan:   Problem List Items Addressed This Visit       Cardiovascular and Mediastinum   Low-renin essential hypertension - Primary   Low-renin essential hypertension Hypertension uncontrolled with current regimen. Clonidine  inadequate for control. Blood pressure fluctuations causing lightheadedness and fatigue. Extended-release formulation needed. - Instructed to take clonidine  whole tablet three times a day, every eight hours. - Submitted prior authorization for clonidine  extended-release formulation. - Monitor blood pressure regularly and track readings.        Other   Vitamin D  deficiency   Vitamin D  deficiency Vitamin D  level low at 21 ng/mL. Difficulty swallowing pills may affect supplementation. - Recommended over-the-counter vitamin D3 supplementation, starting with 25 micrograms daily. - Consider prescription vitamin D  if over-the-counter options are not feasible.      Other Visit Diagnoses       Spider bite wound, undetermined intent, initial encounter  Low vitamin D  level           Assessment and Plan Assessment & Plan   Spider bite wound Suspected black widow bite on shoulder. Initial symptoms improved, but marks suggestive of spider bite remain.seem to be healing well. Has been looked at by HCP.       Meds ordered this encounter  Medications   Lancets Misc. MISC    Sig: Use 1-2 x daily PRN    Dispense:  100 mg    Refill:  0    cloNIDine  HCl (KAPVAY ) 0.1 MG TB12 ER tablet    Sig: Take 1 tablet (0.1 mg total) by mouth 2 (two) times daily.    Dispense:  60 tablet    Refill:  2   I spent 30 minutes on the day of the encounter to include pre-visit record review, face-to-face time with the patient and post visit ordering of test.  No follow-ups on file.  Dorothyann Byars, MD [facility] Primary Care & Sports Medicine at [facility]   "

## 2024-09-02 NOTE — Assessment & Plan Note (Signed)
 Low-renin essential hypertension Hypertension uncontrolled with current regimen. Clonidine  inadequate for control. Blood pressure fluctuations causing lightheadedness and fatigue. Extended-release formulation needed. - Instructed to take clonidine  whole tablet three times a day, every eight hours. - Submitted prior authorization for clonidine  extended-release formulation. - Monitor blood pressure regularly and track readings.

## 2024-09-02 NOTE — Assessment & Plan Note (Signed)
 Vitamin D  deficiency Vitamin D  level low at 21 ng/mL. Difficulty swallowing pills may affect supplementation. - Recommended over-the-counter vitamin D3 supplementation, starting with 25 micrograms daily. - Consider prescription vitamin D  if over-the-counter options are not feasible.

## 2024-09-02 NOTE — Telephone Encounter (Signed)
 Will need PA on ER clonidine .

## 2024-09-03 NOTE — Telephone Encounter (Signed)
 Pls notify pt, plan exclusion so no option for PA for long acting.  Does she need updated rx for TID dosing on the regular clonidone or consider the patch.

## 2024-09-04 ENCOUNTER — Telehealth: Payer: Self-pay

## 2024-09-04 NOTE — Telephone Encounter (Signed)
 Copied from CRM [id number]. Topic: Clinical - Prescription Issue >> Sep 04, 2024  1:01 PM Mercer PEDLAR wrote: Reason for CRM: [facility] is calling for clarification on Continuous Glucose Sensor (DEXCOM G7 SENSOR) MISC. She stated that there are two different sensors one is for 10 days and one for 15 days.

## 2024-09-04 NOTE — Telephone Encounter (Signed)
 Spoke with [facility] pharmacist from [facility] to clarify the patient is to get the sensor she can wear for 15 days. Attempted to call the patient in regard to the clonidine . LM to return call

## 2024-09-10 ENCOUNTER — Other Ambulatory Visit: Payer: Self-pay | Admitting: Medical Genetics

## 2024-09-10 ENCOUNTER — Encounter: Payer: Self-pay | Admitting: Family Medicine

## 2024-09-10 ENCOUNTER — Telehealth: Admitting: Family Medicine

## 2024-09-10 DIAGNOSIS — I10 Essential (primary) hypertension: Secondary | ICD-10-CM

## 2024-09-10 DIAGNOSIS — J3489 Other specified disorders of nose and nasal sinuses: Secondary | ICD-10-CM | POA: Diagnosis not present

## 2024-09-10 DIAGNOSIS — E1169 Type 2 diabetes mellitus with other specified complication: Secondary | ICD-10-CM

## 2024-09-10 DIAGNOSIS — E785 Hyperlipidemia, unspecified: Secondary | ICD-10-CM

## 2024-09-10 DIAGNOSIS — Z006 Encounter for examination for normal comparison and control in clinical research program: Secondary | ICD-10-CM

## 2024-09-10 LAB — LAB REPORT - SCANNED

## 2024-09-10 MED ORDER — BLOOD GLUCOSE TEST VI STRP: 1.0000 | ORAL_STRIP | 99 refills | Status: AC

## 2024-09-10 MED ORDER — LANCET DEVICE MISC: 1.0000 | 99 refills | Status: AC

## 2024-09-10 MED ORDER — BLOOD GLUCOSE MONITORING SUPPL DEVI: 1.0000 | 0 refills | Status: AC

## 2024-09-10 MED ORDER — LANCETS MISC: 1.0000 | 99 refills | Status: AC

## 2024-09-10 MED ORDER — CLINDAMYCIN HCL 150 MG PO CAPS: 150.0000 mg | ORAL_CAPSULE | Freq: Two times a day (BID) | ORAL | 0 refills | Status: AC

## 2024-09-10 MED ORDER — CLONIDINE HCL 0.2 MG PO TABS: 0.2000 mg | ORAL_TABLET | Freq: Three times a day (TID) | ORAL | 1 refills | Status: AC

## 2024-09-10 NOTE — Assessment & Plan Note (Signed)
 Low-renin essential hypertension Uncontrolled blood pressure with current regimen. Concerns about orthostatic changes and cardiovascular risks. - Increased clonidine  to 0.2 mg three times daily. - Advised to monitor blood pressure closely. - Encouraged to contact her cardiologist.

## 2024-09-10 NOTE — Assessment & Plan Note (Signed)
 Type 2 diabetes mellitus with other specified complication Diabetes management complicated by medication supply issues and variable blood glucose levels. A1c concerns due to recent illness. - Ordered A1c test. - Rf placed to connect with our  pharmacist for medication supply and DM education . - Discussed potential medication adjustments based on A1c results. - will resend supplies to [facility].  - continue to work on healthy diet since not on metformin 

## 2024-09-10 NOTE — Progress Notes (Signed)
 "                    MyChart Video Visit    Virtual Visit via Video Note   This format is felt to be most appropriate for this patient at this time. Physical exam was limited by quality of the video and audio technology used for the visit.    Patient location: home Provider location:  [facility] Persons involved in the visit: patient, provider  I discussed the limitations of evaluation and management by telemedicine and the availability of in person appointments. The patient expressed understanding and agreed to proceed.  Patient: Jennifer [name]   DOB: [birth year]   59 y.o. Female  MRN: [id number] Visit Date: 09/10/2024  Today's healthcare provider: Dorothyann Byars, MD   No chief complaint on file.   Subjective:    HPI  Discussed the use of AI scribe software for clinical note transcription with the patient, who gave verbal consent to proceed.  History of Present Illness Jennifer [name] is a 59 year old female with hypertension and diabetes who presents with sinus pain and medication issues.  Sinus pain and upper respiratory symptoms - Severe sinus pain (over cheeks) ongoing for the past two weeks. - Nasal discharge is thick, ranging in color from beige with orange tinge to fluorescent green, occasionally blood-tinged. - Worsening symptoms prompted emergency department visit five days ago; wheezing was noted. - Received magnesium  in the ED, which initially caused discomfort but eventually alleviated headache. - Declined steroids due to concerns about hyperglycemia. - Saline nasal irrigation used, but associated with dizziness. - Symptoms worsen with exposure to cold air and sleeping with a fan on. - Conflict with husband regarding fan use due to symptom exacerbation.  Throat symptoms and infectious disease testing - Recent testing for COVID-19 due to similar symptoms; prior positive COVID-19 test in  September. - Strep test performed due to ulcer-like symptoms in throat. - No history of strep throat.  Diabetes management issues - Difficulty managing diabetes due to glucometer and supply issues. - Switched pharmacies due to dissatisfaction with service and insurance coverage problems. - Blood glucose readings inconsistent, with some values as high as 300 mg/dL. - Concern regarding elevated A1c levels. - Not currently taking metformin . - Awaiting A1c test to determine further management.  Hypertension and blood pressure fluctuations - Blood pressure readings fluctuate, sometimes reaching 170/97 mmHg. - Currently taking clonidine  three times daily and metoprolol . - Experiences blurred vision and headaches when blood pressure is elevated.   ROS       Objective:    LMP 06/25/2013       Physical Exam Vitals reviewed.  Constitutional:      Appearance: Normal appearance.  HENT:     Head: Normocephalic.  Pulmonary:     Effort: Pulmonary effort is normal.  Neurological:     Mental Status: She is alert and oriented to person, place, and time.  Psychiatric:        Mood and Affect: Mood normal.        Behavior: Behavior normal.         Assessment & Plan:    Problem List Items Addressed This Visit       Cardiovascular and Mediastinum   Low-renin essential hypertension   Low-renin essential hypertension Uncontrolled blood pressure with current regimen. Concerns about orthostatic changes and cardiovascular risks. - Increased clonidine  to 0.2 mg three times daily. - Advised  to monitor blood pressure closely. - Encouraged to contact her cardiologist.      Relevant Medications   cloNIDine  (CATAPRES ) 0.2 MG tablet     Endocrine   Hyperlipidemia associated with type 2 diabetes mellitus (HCC)   Type 2 diabetes mellitus with other specified complication Diabetes management complicated by medication supply issues and variable blood glucose levels. A1c concerns due to  recent illness. - Ordered A1c test. - Rf placed to connect with our  pharmacist for medication supply and DM education . - Discussed potential medication adjustments based on A1c results. - will resend supplies to [facility].  - continue to work on healthy diet since not on metformin        Relevant Medications   cloNIDine  (CATAPRES ) 0.2 MG tablet   Blood Glucose Monitoring Suppl DEVI   Glucose Blood (BLOOD GLUCOSE TEST STRIPS) STRP   Lancet Device MISC   Lancets MISC   Other Relevant Orders   Hemoglobin A1c   AMB Referral VBCI Care Management   Other Visit Diagnoses       Sinus pain    -  Primary   Relevant Medications   clindamycin  (CLEOCIN ) 150 MG capsule       Assessment and Plan Assessment & Plan Acute sinusitis Persistent sinus pain, congestion, and discharge suggest possible bacterial sinusitis. - Prescribed clindamycin . - Advised to monitor symptoms and start antibiotics if symptoms worsen or do not improve. - contnue with nasal saline irrigation      Meds ordered this encounter  Medications   cloNIDine  (CATAPRES ) 0.2 MG tablet    Sig: Take 1 tablet (0.2 mg total) by mouth 3 (three) times daily.    Dispense:  90 tablet    Refill:  1   clindamycin  (CLEOCIN ) 150 MG capsule    Sig: Take 1 capsule (150 mg total) by mouth in the morning and at bedtime.    Dispense:  14 capsule    Refill:  0   Blood Glucose Monitoring Suppl DEVI    Sig: 1 each by Does not apply route as directed. Dispense based on patient and insurance preference. Use up to four times daily as directed. (FOR ICD-10 E10.9, E11.9).    Dispense:  1 each    Refill:  0   Glucose Blood (BLOOD GLUCOSE TEST STRIPS) STRP    Sig: 1 each by Does not apply route as directed. Dispense based on patient and insurance preference. Use up to two times daily as directed. (FOR ICD-10 E10.9, E11.9).    Dispense:  100 strip    Refill:  PRN   Lancet Device MISC    Sig: 1 each by Does not apply route as directed.  Dispense based on patient and insurance preference. Use up to two times daily as directed. (FOR ICD-10 E10.9, E11.9).    Dispense:  1 each    Refill:  PRN   Lancets MISC    Sig: 1 each by Does not apply route as directed. Dispense based on patient and insurance preference. Use up to two times daily as directed. (FOR ICD-10 E10.9, E11.9).    Dispense:  100 each    Refill:  PRN     No follow-ups on file.     I discussed the assessment and treatment plan with the patient. The patient was provided an opportunity to ask questions and all were answered. The patient agreed with the plan and demonstrated an understanding of the instructions.   The patient was advised to call back or  seek an in-person evaluation if the symptoms worsen or if the condition fails to improve as anticipated.  I provided 45 minutes of non-face-to-face time during this encounter.  Dorothyann Byars, MD [facility] Primary Care & Sports Medicine at [name]    "

## 2024-09-11 LAB — LAB REPORT - SCANNED

## 2024-09-16 ENCOUNTER — Encounter: Payer: Self-pay | Admitting: Family Medicine

## 2024-09-17 ENCOUNTER — Ambulatory Visit: Payer: Self-pay | Admitting: Family Medicine

## 2024-09-17 LAB — LAB REPORT - SCANNED: A1c: 6.9

## 2024-09-17 NOTE — Progress Notes (Signed)
 A1C of 6.9. I would really like her to use the metformin .
